# Patient Record
Sex: Female | Born: 1960 | Race: Black or African American | Hispanic: No | Marital: Single | Smoking: Current every day smoker
Health system: Southern US, Community
[De-identification: ages and names within clinical notes are randomized; demographics above are authoritative.]

## PROBLEM LIST (undated history)

## (undated) DIAGNOSIS — I10 Essential (primary) hypertension: Secondary | ICD-10-CM

## (undated) DIAGNOSIS — F329 Major depressive disorder, single episode, unspecified: Secondary | ICD-10-CM

## (undated) DIAGNOSIS — G43909 Migraine, unspecified, not intractable, without status migrainosus: Secondary | ICD-10-CM

## (undated) DIAGNOSIS — J189 Pneumonia, unspecified organism: Secondary | ICD-10-CM

## (undated) DIAGNOSIS — Z89511 Acquired absence of right leg below knee: Secondary | ICD-10-CM

## (undated) DIAGNOSIS — M1990 Unspecified osteoarthritis, unspecified site: Secondary | ICD-10-CM

## (undated) DIAGNOSIS — R519 Headache, unspecified: Secondary | ICD-10-CM

## (undated) DIAGNOSIS — D649 Anemia, unspecified: Secondary | ICD-10-CM

## (undated) DIAGNOSIS — Z89512 Acquired absence of left leg below knee: Secondary | ICD-10-CM

## (undated) DIAGNOSIS — F32A Depression, unspecified: Secondary | ICD-10-CM

## (undated) DIAGNOSIS — M069 Rheumatoid arthritis, unspecified: Secondary | ICD-10-CM

## (undated) DIAGNOSIS — F1410 Cocaine abuse, uncomplicated: Secondary | ICD-10-CM

## (undated) DIAGNOSIS — R51 Headache: Secondary | ICD-10-CM

## (undated) DIAGNOSIS — I776 Arteritis, unspecified: Secondary | ICD-10-CM

## (undated) HISTORY — DX: Cocaine abuse, uncomplicated: F14.10

## (undated) HISTORY — DX: Acquired absence of right leg below knee: Z89.511

## (undated) HISTORY — DX: Acquired absence of left leg below knee: Z89.512

## (undated) HISTORY — DX: Major depressive disorder, single episode, unspecified: F32.9

## (undated) HISTORY — PX: HERNIA REPAIR: SHX51

## (undated) HISTORY — DX: Depression, unspecified: F32.A

## (undated) HISTORY — DX: Essential (primary) hypertension: I10

## (undated) HISTORY — PX: SKIN BIOPSY: SHX1

## (undated) HISTORY — DX: Arteritis, unspecified: I77.6

---

## 2008-12-12 ENCOUNTER — Emergency Department (HOSPITAL_COMMUNITY): Admission: EM | Admit: 2008-12-12 | Discharge: 2008-12-12 | Payer: Self-pay | Admitting: Emergency Medicine

## 2008-12-21 ENCOUNTER — Emergency Department (HOSPITAL_COMMUNITY): Admission: EM | Admit: 2008-12-21 | Discharge: 2008-12-21 | Payer: Self-pay | Admitting: Emergency Medicine

## 2008-12-29 ENCOUNTER — Emergency Department (HOSPITAL_COMMUNITY): Admission: EM | Admit: 2008-12-29 | Discharge: 2008-12-29 | Payer: Self-pay | Admitting: Emergency Medicine

## 2009-04-11 ENCOUNTER — Emergency Department (HOSPITAL_COMMUNITY): Admission: EM | Admit: 2009-04-11 | Discharge: 2009-04-12 | Payer: Self-pay | Admitting: Emergency Medicine

## 2009-05-07 ENCOUNTER — Emergency Department (HOSPITAL_COMMUNITY): Admission: EM | Admit: 2009-05-07 | Discharge: 2009-05-08 | Payer: Self-pay | Admitting: Emergency Medicine

## 2009-05-12 ENCOUNTER — Inpatient Hospital Stay (HOSPITAL_COMMUNITY): Admission: EM | Admit: 2009-05-12 | Discharge: 2009-05-17 | Payer: Self-pay | Admitting: Emergency Medicine

## 2009-05-31 ENCOUNTER — Encounter: Payer: Self-pay | Admitting: Internal Medicine

## 2009-05-31 ENCOUNTER — Inpatient Hospital Stay (HOSPITAL_COMMUNITY): Admission: EM | Admit: 2009-05-31 | Discharge: 2009-06-12 | Payer: Self-pay | Admitting: Emergency Medicine

## 2009-05-31 ENCOUNTER — Ambulatory Visit: Payer: Self-pay | Admitting: Infectious Disease

## 2009-06-01 ENCOUNTER — Ambulatory Visit: Payer: Self-pay | Admitting: Vascular Surgery

## 2009-06-01 ENCOUNTER — Encounter: Payer: Self-pay | Admitting: Infectious Disease

## 2009-06-03 ENCOUNTER — Encounter (INDEPENDENT_AMBULATORY_CARE_PROVIDER_SITE_OTHER): Payer: Self-pay | Admitting: Internal Medicine

## 2009-06-05 ENCOUNTER — Encounter (INDEPENDENT_AMBULATORY_CARE_PROVIDER_SITE_OTHER): Payer: Self-pay | Admitting: Otolaryngology

## 2009-07-08 ENCOUNTER — Encounter (HOSPITAL_COMMUNITY): Admission: RE | Admit: 2009-07-08 | Discharge: 2009-10-03 | Payer: Self-pay | Admitting: Infectious Diseases

## 2009-10-05 ENCOUNTER — Emergency Department (HOSPITAL_COMMUNITY): Admission: EM | Admit: 2009-10-05 | Discharge: 2009-10-05 | Payer: Self-pay | Admitting: Emergency Medicine

## 2009-10-07 ENCOUNTER — Encounter (HOSPITAL_COMMUNITY): Admission: RE | Admit: 2009-10-07 | Discharge: 2010-01-05 | Payer: Self-pay | Admitting: Infectious Diseases

## 2009-11-20 ENCOUNTER — Emergency Department (HOSPITAL_COMMUNITY): Admission: EM | Admit: 2009-11-20 | Discharge: 2009-11-20 | Payer: Self-pay | Admitting: Emergency Medicine

## 2009-11-21 ENCOUNTER — Emergency Department (HOSPITAL_COMMUNITY): Admission: EM | Admit: 2009-11-21 | Discharge: 2009-11-21 | Payer: Self-pay | Admitting: Emergency Medicine

## 2009-11-24 ENCOUNTER — Emergency Department (HOSPITAL_COMMUNITY): Admission: EM | Admit: 2009-11-24 | Discharge: 2009-11-24 | Payer: Self-pay | Admitting: Emergency Medicine

## 2009-11-26 ENCOUNTER — Inpatient Hospital Stay (HOSPITAL_COMMUNITY): Admission: EM | Admit: 2009-11-26 | Discharge: 2009-12-02 | Payer: Self-pay | Admitting: Emergency Medicine

## 2009-11-26 ENCOUNTER — Ambulatory Visit: Payer: Self-pay | Admitting: Internal Medicine

## 2009-11-26 ENCOUNTER — Ambulatory Visit: Payer: Self-pay | Admitting: Cardiology

## 2009-11-27 ENCOUNTER — Encounter: Payer: Self-pay | Admitting: Internal Medicine

## 2009-12-02 ENCOUNTER — Encounter (INDEPENDENT_AMBULATORY_CARE_PROVIDER_SITE_OTHER): Payer: Self-pay | Admitting: Internal Medicine

## 2009-12-03 ENCOUNTER — Emergency Department (HOSPITAL_COMMUNITY): Admission: EM | Admit: 2009-12-03 | Discharge: 2009-12-04 | Payer: Self-pay | Admitting: Emergency Medicine

## 2010-01-13 ENCOUNTER — Emergency Department (HOSPITAL_COMMUNITY): Admission: EM | Admit: 2010-01-13 | Discharge: 2010-01-14 | Payer: Self-pay | Admitting: Emergency Medicine

## 2010-01-30 ENCOUNTER — Ambulatory Visit: Payer: Self-pay | Admitting: Internal Medicine

## 2010-01-30 ENCOUNTER — Ambulatory Visit: Payer: Self-pay | Admitting: Infectious Diseases

## 2010-01-30 ENCOUNTER — Ambulatory Visit: Payer: Self-pay | Admitting: Pulmonary Disease

## 2010-01-30 ENCOUNTER — Inpatient Hospital Stay (HOSPITAL_COMMUNITY): Admission: EM | Admit: 2010-01-30 | Discharge: 2010-02-26 | Payer: Self-pay | Admitting: Emergency Medicine

## 2010-01-30 ENCOUNTER — Encounter (INDEPENDENT_AMBULATORY_CARE_PROVIDER_SITE_OTHER): Payer: Self-pay | Admitting: Internal Medicine

## 2010-01-30 ENCOUNTER — Encounter (INDEPENDENT_AMBULATORY_CARE_PROVIDER_SITE_OTHER): Payer: Self-pay | Admitting: Emergency Medicine

## 2010-01-30 ENCOUNTER — Ambulatory Visit: Payer: Self-pay | Admitting: Surgery

## 2010-02-21 ENCOUNTER — Ambulatory Visit: Payer: Self-pay | Admitting: Psychiatry

## 2010-02-24 ENCOUNTER — Ambulatory Visit: Payer: Self-pay | Admitting: Psychiatry

## 2010-02-26 ENCOUNTER — Encounter: Payer: Self-pay | Admitting: Internal Medicine

## 2010-02-26 DIAGNOSIS — I10 Essential (primary) hypertension: Secondary | ICD-10-CM

## 2010-02-26 DIAGNOSIS — T405X1A Poisoning by cocaine, accidental (unintentional), initial encounter: Secondary | ICD-10-CM | POA: Insufficient documentation

## 2010-03-06 ENCOUNTER — Ambulatory Visit: Payer: Self-pay | Admitting: Internal Medicine

## 2010-03-06 DIAGNOSIS — F39 Unspecified mood [affective] disorder: Secondary | ICD-10-CM | POA: Insufficient documentation

## 2010-03-10 ENCOUNTER — Telehealth: Payer: Self-pay | Admitting: *Deleted

## 2010-03-10 ENCOUNTER — Encounter: Payer: Self-pay | Admitting: Internal Medicine

## 2010-03-11 ENCOUNTER — Telehealth: Payer: Self-pay | Admitting: Licensed Clinical Social Worker

## 2010-03-12 ENCOUNTER — Encounter: Payer: Self-pay | Admitting: Internal Medicine

## 2010-03-14 ENCOUNTER — Encounter: Payer: Self-pay | Admitting: Licensed Clinical Social Worker

## 2010-03-22 ENCOUNTER — Emergency Department (HOSPITAL_COMMUNITY): Admission: EM | Admit: 2010-03-22 | Discharge: 2010-03-22 | Payer: Self-pay | Admitting: Emergency Medicine

## 2010-03-25 ENCOUNTER — Emergency Department (HOSPITAL_COMMUNITY): Admission: EM | Admit: 2010-03-25 | Discharge: 2010-03-26 | Payer: Self-pay | Admitting: Emergency Medicine

## 2010-04-05 ENCOUNTER — Emergency Department (HOSPITAL_COMMUNITY): Admission: EM | Admit: 2010-04-05 | Discharge: 2010-04-06 | Payer: Self-pay | Admitting: Emergency Medicine

## 2010-04-07 ENCOUNTER — Emergency Department (HOSPITAL_COMMUNITY): Admission: EM | Admit: 2010-04-07 | Discharge: 2010-04-08 | Payer: Self-pay | Admitting: Emergency Medicine

## 2010-04-10 ENCOUNTER — Encounter: Admission: RE | Admit: 2010-04-10 | Discharge: 2010-04-28 | Payer: Self-pay | Admitting: Family Medicine

## 2010-04-11 ENCOUNTER — Ambulatory Visit: Payer: Self-pay | Admitting: Internal Medicine

## 2010-04-11 ENCOUNTER — Inpatient Hospital Stay (HOSPITAL_COMMUNITY)
Admission: EM | Admit: 2010-04-11 | Discharge: 2010-04-17 | Payer: Self-pay | Source: Home / Self Care | Admitting: Internal Medicine

## 2010-04-11 ENCOUNTER — Encounter: Payer: Self-pay | Admitting: Licensed Clinical Social Worker

## 2010-04-11 ENCOUNTER — Encounter: Payer: Self-pay | Admitting: Ophthalmology

## 2010-04-11 DIAGNOSIS — F3289 Other specified depressive episodes: Secondary | ICD-10-CM | POA: Insufficient documentation

## 2010-04-11 DIAGNOSIS — F329 Major depressive disorder, single episode, unspecified: Secondary | ICD-10-CM | POA: Insufficient documentation

## 2010-04-11 DIAGNOSIS — G3184 Mild cognitive impairment, so stated: Secondary | ICD-10-CM | POA: Insufficient documentation

## 2010-04-17 ENCOUNTER — Encounter: Payer: Self-pay | Admitting: Internal Medicine

## 2010-04-17 DIAGNOSIS — I776 Arteritis, unspecified: Secondary | ICD-10-CM

## 2010-04-17 HISTORY — DX: Arteritis, unspecified: I77.6

## 2010-04-20 ENCOUNTER — Encounter: Payer: Self-pay | Admitting: Internal Medicine

## 2010-04-20 ENCOUNTER — Inpatient Hospital Stay (HOSPITAL_COMMUNITY)
Admission: EM | Admit: 2010-04-20 | Discharge: 2010-04-26 | Payer: Self-pay | Source: Home / Self Care | Admitting: Emergency Medicine

## 2010-04-26 ENCOUNTER — Encounter: Payer: Self-pay | Admitting: Internal Medicine

## 2010-04-29 ENCOUNTER — Telehealth: Payer: Self-pay | Admitting: Licensed Clinical Social Worker

## 2010-05-08 ENCOUNTER — Ambulatory Visit: Payer: Self-pay | Admitting: Internal Medicine

## 2010-05-09 ENCOUNTER — Telehealth: Payer: Self-pay | Admitting: Licensed Clinical Social Worker

## 2010-05-20 ENCOUNTER — Telehealth: Payer: Self-pay | Admitting: Licensed Clinical Social Worker

## 2010-05-22 ENCOUNTER — Ambulatory Visit: Payer: Self-pay | Admitting: Internal Medicine

## 2010-05-25 ENCOUNTER — Ambulatory Visit: Payer: Self-pay | Admitting: Internal Medicine

## 2010-05-25 ENCOUNTER — Observation Stay (HOSPITAL_COMMUNITY): Admission: EM | Admit: 2010-05-25 | Discharge: 2010-06-02 | Payer: Self-pay | Admitting: Emergency Medicine

## 2010-05-25 ENCOUNTER — Encounter: Payer: Self-pay | Admitting: Internal Medicine

## 2010-05-28 ENCOUNTER — Telehealth: Payer: Self-pay | Admitting: Licensed Clinical Social Worker

## 2010-06-02 ENCOUNTER — Emergency Department (HOSPITAL_COMMUNITY): Admission: EM | Admit: 2010-06-02 | Discharge: 2010-06-03 | Payer: Self-pay | Admitting: Emergency Medicine

## 2010-06-02 ENCOUNTER — Encounter: Payer: Self-pay | Admitting: Internal Medicine

## 2010-06-03 ENCOUNTER — Encounter: Payer: Self-pay | Admitting: Internal Medicine

## 2010-06-03 ENCOUNTER — Encounter: Payer: Self-pay | Admitting: Licensed Clinical Social Worker

## 2010-06-08 ENCOUNTER — Encounter: Payer: Self-pay | Admitting: Ophthalmology

## 2010-06-08 ENCOUNTER — Ambulatory Visit: Payer: Self-pay | Admitting: Infectious Diseases

## 2010-06-08 ENCOUNTER — Inpatient Hospital Stay (HOSPITAL_COMMUNITY): Admission: EM | Admit: 2010-06-08 | Discharge: 2010-06-13 | Payer: Self-pay | Admitting: Emergency Medicine

## 2010-06-12 ENCOUNTER — Telehealth: Payer: Self-pay | Admitting: Licensed Clinical Social Worker

## 2010-06-12 ENCOUNTER — Encounter: Payer: Self-pay | Admitting: Ophthalmology

## 2010-06-18 ENCOUNTER — Telehealth: Payer: Self-pay

## 2010-06-24 ENCOUNTER — Observation Stay (HOSPITAL_COMMUNITY)
Admission: EM | Admit: 2010-06-24 | Discharge: 2010-06-30 | Payer: Self-pay | Source: Home / Self Care | Admitting: Emergency Medicine

## 2010-06-25 ENCOUNTER — Ambulatory Visit: Payer: Self-pay | Admitting: Infectious Diseases

## 2010-06-25 ENCOUNTER — Encounter: Payer: Self-pay | Admitting: Internal Medicine

## 2010-06-26 ENCOUNTER — Encounter: Payer: Self-pay | Admitting: Infectious Diseases

## 2010-06-26 ENCOUNTER — Encounter: Payer: Self-pay | Admitting: Internal Medicine

## 2010-06-27 ENCOUNTER — Encounter: Payer: Self-pay | Admitting: Ophthalmology

## 2010-06-30 ENCOUNTER — Encounter: Payer: Self-pay | Admitting: Internal Medicine

## 2010-07-06 ENCOUNTER — Encounter: Payer: Self-pay | Admitting: Ophthalmology

## 2010-07-06 ENCOUNTER — Ambulatory Visit: Payer: Self-pay | Admitting: Internal Medicine

## 2010-07-06 ENCOUNTER — Inpatient Hospital Stay (HOSPITAL_COMMUNITY): Admission: EM | Admit: 2010-07-06 | Discharge: 2010-07-25 | Payer: Self-pay | Admitting: Emergency Medicine

## 2010-07-07 ENCOUNTER — Encounter: Payer: Self-pay | Admitting: Infectious Diseases

## 2010-07-08 ENCOUNTER — Encounter: Payer: Self-pay | Admitting: Internal Medicine

## 2010-07-24 ENCOUNTER — Encounter: Payer: Self-pay | Admitting: Internal Medicine

## 2010-07-26 ENCOUNTER — Emergency Department (HOSPITAL_COMMUNITY): Admission: EM | Admit: 2010-07-26 | Discharge: 2010-07-26 | Payer: Self-pay | Admitting: Emergency Medicine

## 2010-07-27 ENCOUNTER — Emergency Department (HOSPITAL_COMMUNITY): Admission: EM | Admit: 2010-07-27 | Discharge: 2010-07-28 | Payer: Self-pay | Admitting: Emergency Medicine

## 2010-07-29 ENCOUNTER — Emergency Department (HOSPITAL_COMMUNITY): Admission: EM | Admit: 2010-07-29 | Discharge: 2010-07-29 | Payer: Self-pay | Admitting: Emergency Medicine

## 2010-07-30 ENCOUNTER — Ambulatory Visit: Payer: Self-pay | Admitting: Internal Medicine

## 2010-07-31 ENCOUNTER — Emergency Department (HOSPITAL_COMMUNITY): Admission: EM | Admit: 2010-07-31 | Discharge: 2010-07-31 | Payer: Self-pay | Admitting: Emergency Medicine

## 2010-07-31 ENCOUNTER — Telehealth: Payer: Self-pay | Admitting: Licensed Clinical Social Worker

## 2010-08-01 ENCOUNTER — Emergency Department (HOSPITAL_COMMUNITY): Admission: EM | Admit: 2010-08-01 | Discharge: 2010-08-02 | Payer: Self-pay | Admitting: Emergency Medicine

## 2010-08-05 ENCOUNTER — Ambulatory Visit: Payer: Self-pay | Admitting: Infectious Diseases

## 2010-08-05 ENCOUNTER — Inpatient Hospital Stay (HOSPITAL_COMMUNITY): Admission: EM | Admit: 2010-08-05 | Discharge: 2010-08-14 | Payer: Self-pay | Admitting: Emergency Medicine

## 2010-08-05 ENCOUNTER — Encounter: Payer: Self-pay | Admitting: Internal Medicine

## 2010-08-13 ENCOUNTER — Encounter: Payer: Self-pay | Admitting: Internal Medicine

## 2010-08-13 DIAGNOSIS — B029 Zoster without complications: Secondary | ICD-10-CM | POA: Insufficient documentation

## 2010-08-21 ENCOUNTER — Encounter: Payer: Self-pay | Admitting: Internal Medicine

## 2010-08-23 ENCOUNTER — Ambulatory Visit: Payer: Self-pay | Admitting: Psychiatry

## 2010-08-24 ENCOUNTER — Emergency Department (HOSPITAL_COMMUNITY): Admission: EM | Admit: 2010-08-24 | Discharge: 2010-08-24 | Payer: Self-pay | Admitting: Emergency Medicine

## 2010-08-29 ENCOUNTER — Emergency Department (HOSPITAL_COMMUNITY): Admission: EM | Admit: 2010-08-29 | Discharge: 2010-08-30 | Payer: Self-pay | Admitting: Emergency Medicine

## 2010-08-30 ENCOUNTER — Telehealth: Payer: Self-pay | Admitting: Internal Medicine

## 2010-09-03 ENCOUNTER — Ambulatory Visit: Payer: Self-pay | Admitting: Internal Medicine

## 2010-09-03 DIAGNOSIS — R079 Chest pain, unspecified: Secondary | ICD-10-CM

## 2010-09-09 ENCOUNTER — Emergency Department (HOSPITAL_COMMUNITY)
Admission: EM | Admit: 2010-09-09 | Discharge: 2010-09-09 | Payer: Self-pay | Source: Home / Self Care | Admitting: Emergency Medicine

## 2010-09-10 ENCOUNTER — Ambulatory Visit (HOSPITAL_COMMUNITY)
Admission: RE | Admit: 2010-09-10 | Discharge: 2010-09-10 | Payer: Self-pay | Source: Home / Self Care | Attending: Internal Medicine | Admitting: Internal Medicine

## 2010-09-11 ENCOUNTER — Emergency Department (HOSPITAL_COMMUNITY): Admission: EM | Admit: 2010-09-11 | Discharge: 2010-06-20 | Payer: Self-pay | Admitting: Emergency Medicine

## 2010-09-13 ENCOUNTER — Emergency Department (HOSPITAL_COMMUNITY)
Admission: EM | Admit: 2010-09-13 | Discharge: 2010-09-13 | Payer: Self-pay | Source: Home / Self Care | Admitting: Emergency Medicine

## 2010-09-15 ENCOUNTER — Encounter: Payer: Self-pay | Admitting: Internal Medicine

## 2010-09-15 ENCOUNTER — Inpatient Hospital Stay (HOSPITAL_COMMUNITY)
Admission: AD | Admit: 2010-09-15 | Discharge: 2010-09-17 | Payer: Self-pay | Source: Home / Self Care | Attending: Internal Medicine | Admitting: Internal Medicine

## 2010-09-15 ENCOUNTER — Ambulatory Visit: Payer: Self-pay | Admitting: Internal Medicine

## 2010-09-17 ENCOUNTER — Encounter: Payer: Self-pay | Admitting: Internal Medicine

## 2010-09-17 DIAGNOSIS — M129 Arthropathy, unspecified: Secondary | ICD-10-CM | POA: Insufficient documentation

## 2010-09-18 ENCOUNTER — Other Ambulatory Visit
Admission: RE | Admit: 2010-09-18 | Discharge: 2010-09-18 | Payer: Self-pay | Source: Home / Self Care | Admitting: Internal Medicine

## 2010-09-18 ENCOUNTER — Ambulatory Visit: Payer: Self-pay | Admitting: Internal Medicine

## 2010-09-18 LAB — HM PAP SMEAR

## 2010-09-24 LAB — CONVERTED CEMR LAB

## 2010-09-25 LAB — CONVERTED CEMR LAB: Gardnerella vaginalis: NEGATIVE

## 2010-10-05 ENCOUNTER — Emergency Department (HOSPITAL_COMMUNITY)
Admission: EM | Admit: 2010-10-05 | Discharge: 2010-10-05 | Disposition: A | Discharge status: Other Acute Inpatient Hospital | Payer: Self-pay | Source: Home / Self Care | Admitting: Emergency Medicine

## 2010-10-05 ENCOUNTER — Emergency Department (HOSPITAL_COMMUNITY)
Admission: EM | Admit: 2010-10-05 | Discharge: 2010-10-06 | Payer: Self-pay | Source: Home / Self Care | Admitting: Emergency Medicine

## 2010-10-08 ENCOUNTER — Ambulatory Visit: Admission: RE | Admit: 2010-10-08 | Discharge: 2010-10-08 | Payer: Self-pay | Source: Home / Self Care

## 2010-10-10 ENCOUNTER — Encounter: Payer: Self-pay | Admitting: Internal Medicine

## 2010-10-12 ENCOUNTER — Emergency Department (HOSPITAL_COMMUNITY)
Admission: EM | Admit: 2010-10-12 | Discharge: 2010-10-12 | Payer: Self-pay | Source: Home / Self Care | Admitting: Emergency Medicine

## 2010-10-14 ENCOUNTER — Emergency Department (HOSPITAL_COMMUNITY)
Admission: EM | Admit: 2010-10-14 | Discharge: 2010-10-14 | Payer: Self-pay | Source: Home / Self Care | Admitting: Emergency Medicine

## 2010-10-14 ENCOUNTER — Telehealth (INDEPENDENT_AMBULATORY_CARE_PROVIDER_SITE_OTHER): Payer: Self-pay | Admitting: *Deleted

## 2010-10-16 ENCOUNTER — Emergency Department (HOSPITAL_COMMUNITY)
Admission: EM | Admit: 2010-10-16 | Discharge: 2010-10-16 | Payer: Self-pay | Source: Home / Self Care | Admitting: Emergency Medicine

## 2010-10-20 ENCOUNTER — Emergency Department (HOSPITAL_COMMUNITY)
Admission: EM | Admit: 2010-10-20 | Discharge: 2010-10-20 | Payer: Self-pay | Source: Home / Self Care | Admitting: Emergency Medicine

## 2010-10-20 LAB — CBC
HCT: 35 % — ABNORMAL LOW (ref 36.0–46.0)
HCT: 32.7 % — ABNORMAL LOW (ref 36.0–46.0)
Hemoglobin: 11.6 g/dL — ABNORMAL LOW (ref 12.0–15.0)
Hemoglobin: 10.9 g/dL — ABNORMAL LOW (ref 12.0–15.0)
MCH: 28.6 pg (ref 26.0–34.0)
MCH: 28 pg (ref 26.0–34.0)
MCHC: 33.1 g/dL (ref 30.0–36.0)
MCHC: 33.3 g/dL (ref 30.0–36.0)
MCV: 86.2 fL (ref 78.0–100.0)
MCV: 84.1 fL (ref 78.0–100.0)
Platelets: 323 10*3/uL (ref 150–400)
Platelets: 390 10*3/uL (ref 150–400)
RBC: 4.06 MIL/uL (ref 3.87–5.11)
RBC: 3.89 MIL/uL (ref 3.87–5.11)
RDW: 16.3 % — ABNORMAL HIGH (ref 11.5–15.5)
RDW: 16.1 % — ABNORMAL HIGH (ref 11.5–15.5)
WBC: 4.5 10*3/uL (ref 4.0–10.5)
WBC: 7.5 10*3/uL (ref 4.0–10.5)

## 2010-10-20 LAB — COMPREHENSIVE METABOLIC PANEL
ALT: 13 U/L (ref 0–35)
AST: 17 U/L (ref 0–37)
Albumin: 3.3 g/dL — ABNORMAL LOW (ref 3.5–5.2)
Alkaline Phosphatase: 72 U/L (ref 39–117)
BUN: 12 mg/dL (ref 6–23)
CO2: 28 mEq/L (ref 19–32)
Calcium: 9.3 mg/dL (ref 8.4–10.5)
Chloride: 104 mEq/L (ref 96–112)
Creatinine, Ser: 0.93 mg/dL (ref 0.4–1.2)
GFR calc Af Amer: >60 mL/min (ref >60)
GFR calc non Af Amer: >60 mL/min (ref >60)
Glucose, Bld: 104 mg/dL — ABNORMAL HIGH (ref 70–99)
Potassium: 3.9 mEq/L (ref 3.5–5.1)
Sodium: 140 mEq/L (ref 135–145)
Total Bilirubin: 0.4 mg/dL (ref 0.3–1.2)
Total Protein: 7 g/dL (ref 6.0–8.3)

## 2010-10-20 LAB — POCT CARDIAC MARKERS
CKMB, poc: <1 ng/mL — ABNORMAL LOW (ref 1.0–8.0)
Myoglobin, poc: 71 ng/mL (ref 12–200)
Troponin i, poc: <0.05 ng/mL (ref 0.00–0.09)

## 2010-10-20 LAB — POCT I-STAT, CHEM 8
BUN: 5 mg/dL — ABNORMAL LOW (ref 6–23)
Calcium, Ion: 1.14 mmol/L (ref 1.12–1.32)
Chloride: 104 mEq/L (ref 96–112)
Creatinine, Ser: 0.8 mg/dL (ref 0.4–1.2)
Glucose, Bld: 94 mg/dL (ref 70–99)
HCT: 33 % — ABNORMAL LOW (ref 36.0–46.0)
Hemoglobin: 11.2 g/dL — ABNORMAL LOW (ref 12.0–15.0)
Potassium: 3.9 mEq/L (ref 3.5–5.1)
Sodium: 139 mEq/L (ref 135–145)
TCO2: 26 mmol/L (ref 0–100)

## 2010-10-20 LAB — DIFFERENTIAL
Basophils Absolute: 0 10*3/uL (ref 0.0–0.1)
Basophils Absolute: 0 10*3/uL (ref 0.0–0.1)
Basophils Relative: 0 % (ref 0–1)
Basophils Relative: 0 % (ref 0–1)
Eosinophils Absolute: 0.1 10*3/uL (ref 0.0–0.7)
Eosinophils Absolute: 0 10*3/uL (ref 0.0–0.7)
Eosinophils Relative: 2 % (ref 0–5)
Eosinophils Relative: 0 % (ref 0–5)
Lymphocytes Relative: 24 % (ref 12–46)
Lymphocytes Relative: 10 % — ABNORMAL LOW (ref 12–46)
Lymphs Abs: 1.1 10*3/uL (ref 0.7–4.0)
Lymphs Abs: 0.7 10*3/uL (ref 0.7–4.0)
Monocytes Absolute: 0.2 10*3/uL (ref 0.1–1.0)
Monocytes Absolute: 0.3 10*3/uL (ref 0.1–1.0)
Monocytes Relative: 5 % (ref 3–12)
Monocytes Relative: 4 % (ref 3–12)
Neutro Abs: 3.1 10*3/uL (ref 1.7–7.7)
Neutro Abs: 6.5 10*3/uL (ref 1.7–7.7)
Neutrophils Relative %: 69 % (ref 43–77)
Neutrophils Relative %: 86 % — ABNORMAL HIGH (ref 43–77)

## 2010-10-20 LAB — BASIC METABOLIC PANEL
BUN: 10 mg/dL (ref 6–23)
CO2: 26 mEq/L (ref 19–32)
Calcium: 9.1 mg/dL (ref 8.4–10.5)
Chloride: 105 mEq/L (ref 96–112)
Creatinine, Ser: 0.67 mg/dL (ref 0.4–1.2)
GFR calc Af Amer: >60 mL/min (ref >60)
GFR calc non Af Amer: >60 mL/min (ref >60)
Glucose, Bld: 106 mg/dL — ABNORMAL HIGH (ref 70–99)
Potassium: 3.9 mEq/L (ref 3.5–5.1)
Sodium: 139 mEq/L (ref 135–145)

## 2010-10-20 LAB — LIPASE, BLOOD: Lipase: 21 U/L (ref 11–59)

## 2010-10-22 ENCOUNTER — Emergency Department (HOSPITAL_COMMUNITY)
Admission: EM | Admit: 2010-10-22 | Discharge: 2010-10-23 | Payer: Self-pay | Source: Home / Self Care | Admitting: Emergency Medicine

## 2010-10-27 LAB — DIFFERENTIAL
Lymphs Abs: 2 10*3/uL (ref 0.7–4.0)
Monocytes Relative: 8 % (ref 3–12)
Neutro Abs: 3 10*3/uL (ref 1.7–7.7)
Neutrophils Relative %: 55 % (ref 43–77)

## 2010-10-27 LAB — CBC
Hemoglobin: 12 g/dL (ref 12.0–15.0)
MCH: 28 pg (ref 26.0–34.0)
MCV: 83.2 fL (ref 78.0–100.0)
RBC: 4.28 MIL/uL (ref 3.87–5.11)

## 2010-10-28 ENCOUNTER — Emergency Department (HOSPITAL_COMMUNITY)
Admission: EM | Admit: 2010-10-28 | Discharge: 2010-10-28 | Payer: Self-pay | Source: Home / Self Care | Admitting: Emergency Medicine

## 2010-10-29 ENCOUNTER — Inpatient Hospital Stay (HOSPITAL_COMMUNITY)
Admission: EM | Admit: 2010-10-29 | Discharge: 2010-11-04 | Disposition: A | Discharge status: Skilled Nursing Facility | Payer: Self-pay | Attending: Internal Medicine | Admitting: Internal Medicine

## 2010-10-29 ENCOUNTER — Emergency Department (HOSPITAL_COMMUNITY)
Admission: EM | Admit: 2010-10-29 | Discharge: 2010-10-29 | Disposition: A | Discharge status: Other Acute Inpatient Hospital | Payer: Self-pay | Source: Home / Self Care | Admitting: Emergency Medicine

## 2010-10-29 ENCOUNTER — Ambulatory Visit: Admit: 2010-10-29 | Payer: Self-pay | Admitting: Obstetrics and Gynecology

## 2010-10-29 ENCOUNTER — Encounter: Payer: Self-pay | Admitting: Internal Medicine

## 2010-10-29 DIAGNOSIS — M069 Rheumatoid arthritis, unspecified: Secondary | ICD-10-CM

## 2010-10-29 LAB — CBC
HCT: 41.7 % (ref 36.0–46.0)
HCT: 32.4 % — ABNORMAL LOW (ref 36.0–46.0)
Hemoglobin: 13.9 g/dL (ref 12.0–15.0)
MCH: 27.6 pg (ref 26.0–34.0)
MCH: 27.7 pg (ref 26.0–34.0)
MCHC: 33.3 g/dL (ref 30.0–36.0)
MCV: 82.7 fL (ref 78.0–100.0)
MCV: 82.2 fL (ref 78.0–100.0)
Platelets: 270 10*3/uL (ref 150–400)
RBC: 3.94 MIL/uL (ref 3.87–5.11)
RDW: 15.6 % — ABNORMAL HIGH (ref 11.5–15.5)

## 2010-10-29 LAB — DIFFERENTIAL
Basophils Absolute: 0 10*3/uL (ref 0.0–0.1)
Lymphocytes Relative: 17 % (ref 12–46)
Lymphs Abs: 1.2 10*3/uL (ref 0.7–4.0)
Monocytes Absolute: 0.2 10*3/uL (ref 0.1–1.0)
Monocytes Relative: 3 % (ref 3–12)
Neutro Abs: 5.4 10*3/uL (ref 1.7–7.7)

## 2010-10-29 LAB — RAPID URINE DRUG SCREEN, HOSP PERFORMED
Amphetamines: NOT DETECTED
Tetrahydrocannabinol: NOT DETECTED

## 2010-10-29 LAB — URINALYSIS, ROUTINE W REFLEX MICROSCOPIC
Bilirubin Urine: NEGATIVE
Specific Gravity, Urine: 1.02 (ref 1.005–1.030)
Urine Glucose, Fasting: NEGATIVE mg/dL
pH: 6 (ref 5.0–8.0)

## 2010-10-29 LAB — GLUCOSE, CAPILLARY: Glucose-Capillary: 128 mg/dL — ABNORMAL HIGH (ref 70–99)

## 2010-10-29 LAB — POCT CARDIAC MARKERS
CKMB, poc: 1 ng/mL (ref 1.0–8.0)
Myoglobin, poc: 58.7 ng/mL (ref 12–200)

## 2010-10-29 LAB — BASIC METABOLIC PANEL
CO2: 22 mEq/L (ref 19–32)
Calcium: 9.3 mg/dL (ref 8.4–10.5)
Glucose, Bld: 125 mg/dL — ABNORMAL HIGH (ref 70–99)
Sodium: 134 mEq/L — ABNORMAL LOW (ref 135–145)

## 2010-10-29 LAB — URIC ACID: Uric Acid, Serum: 3.7 mg/dL (ref 2.4–7.0)

## 2010-10-29 LAB — URINE MICROSCOPIC-ADD ON

## 2010-10-30 DIAGNOSIS — M069 Rheumatoid arthritis, unspecified: Secondary | ICD-10-CM

## 2010-10-30 LAB — URINE MICROSCOPIC-ADD ON

## 2010-10-30 LAB — COMPREHENSIVE METABOLIC PANEL
Albumin: 2.6 g/dL — ABNORMAL LOW (ref 3.5–5.2)
Alkaline Phosphatase: 57 U/L (ref 39–117)
BUN: 8 mg/dL (ref 6–23)
Calcium: 8.4 mg/dL (ref 8.4–10.5)
Creatinine, Ser: 0.65 mg/dL (ref 0.4–1.2)
Glucose, Bld: 132 mg/dL — ABNORMAL HIGH (ref 70–99)
Potassium: 3.7 mEq/L (ref 3.5–5.1)
Total Protein: 5.1 g/dL — ABNORMAL LOW (ref 6.0–8.3)

## 2010-10-30 LAB — URINALYSIS, ROUTINE W REFLEX MICROSCOPIC
Bilirubin Urine: NEGATIVE
Nitrite: NEGATIVE
Specific Gravity, Urine: 1.021 (ref 1.005–1.030)
Urobilinogen, UA: 0.2 mg/dL (ref 0.0–1.0)
pH: 5.5 (ref 5.0–8.0)

## 2010-10-30 LAB — CK TOTAL AND CKMB (NOT AT ARMC): Relative Index: 1 (ref 0.0–2.5)

## 2010-10-31 ENCOUNTER — Encounter: Payer: Self-pay | Admitting: Internal Medicine

## 2010-10-31 LAB — CBC
HCT: 27 % — ABNORMAL LOW (ref 36.0–46.0)
Hemoglobin: 9 g/dL — ABNORMAL LOW (ref 12.0–15.0)
MCH: 27.8 pg (ref 26.0–34.0)
MCV: 83.3 fL (ref 78.0–100.0)
Platelets: 247 10*3/uL (ref 150–400)
RBC: 3.24 MIL/uL — ABNORMAL LOW (ref 3.87–5.11)
WBC: 7.4 10*3/uL (ref 4.0–10.5)

## 2010-10-31 LAB — BASIC METABOLIC PANEL
BUN: 4 mg/dL — ABNORMAL LOW (ref 6–23)
CO2: 26 mEq/L (ref 19–32)
Chloride: 103 mEq/L (ref 96–112)
Creatinine, Ser: 0.56 mg/dL (ref 0.4–1.2)
Potassium: 3.5 mEq/L (ref 3.5–5.1)

## 2010-11-01 LAB — BASIC METABOLIC PANEL
BUN: 8 mg/dL (ref 6–23)
Chloride: 104 mEq/L (ref 96–112)
Creatinine, Ser: 0.62 mg/dL (ref 0.4–1.2)
Glucose, Bld: 103 mg/dL — ABNORMAL HIGH (ref 70–99)
Potassium: 3.5 mEq/L (ref 3.5–5.1)

## 2010-11-01 LAB — CBC
HCT: 28.8 % — ABNORMAL LOW (ref 36.0–46.0)
MCH: 27.9 pg (ref 26.0–34.0)
MCV: 82.1 fL (ref 78.0–100.0)
RBC: 3.51 MIL/uL — ABNORMAL LOW (ref 3.87–5.11)
WBC: 8.7 10*3/uL (ref 4.0–10.5)

## 2010-11-02 LAB — CBC
HCT: 28.1 % — ABNORMAL LOW (ref 36.0–46.0)
MCV: 83.4 fL (ref 78.0–100.0)

## 2010-11-02 LAB — BASIC METABOLIC PANEL
CO2: 28 mEq/L (ref 19–32)
Chloride: 102 mEq/L (ref 96–112)
GFR calc Af Amer: >60 mL/min (ref >60)
Potassium: 3.2 mEq/L — ABNORMAL LOW (ref 3.5–5.1)
Sodium: 142 mEq/L (ref 135–145)

## 2010-11-03 LAB — RAPID URINE DRUG SCREEN, HOSP PERFORMED
Amphetamines: NOT DETECTED
Barbiturates: NOT DETECTED
Opiates: POSITIVE — AB
Tetrahydrocannabinol: NOT DETECTED

## 2010-11-03 NOTE — Consult Note (Signed)
[name], [name]                  ACCOUNT NO.:  [id number]  MEDICAL RECORD NO.:  [id number]           PATIENT TYPE:  LOCATION:                                 FACILITY:  PHYSICIAN:  Nelda Severe. Kellie Simmering, M.D.  DATE OF BIRTH:  17-Aug-1961  DATE OF CONSULTATION:  10/30/2010 DATE OF DISCHARGE:                                CONSULTATION   CHIEF COMPLAINT:  Swelling in right upper and lower extremity with pain.  HISTORY OF PRESENT ILLNESS:  This is a 50 year old female patient with a history of polysubstance abuse including cocaine has a history of levamisole-induced vasculitis.  She now presents with a 1- to 2-day history of swelling in the right upper and right lower extremity.  She states that the pain started spontaneously with no trauma to the upper or lower extremities.  She denies IV drug use and states that she has not been taking cocaine.  She states that the knee, lower leg on the right, right forearm, and the right hand have been painful and swollen. She denies any chills and fever.  She denies claudication symptoms specifically and has no history of deep venous thrombosis or arterial insufficiency.  PAST MEDICAL HISTORY: 1. 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 2. Hypertension. 3. Polysubstance abuse. 4. Depression. 5. Negative for coronary artery disease or stroke.  SOCIAL HISTORY:  She is unemployed.  Does have a history of polysubstance abuse.  Denies using cocaine for several weeks.  Admits to less than a pack of cigarettes per day.  Denies alcohol.  FAMILY HISTORY:  Positive for colon cancer, breast cancer, but negative for coronary artery disease, diabetes, and stroke.  REVIEW OF SYSTEMS:  Denies chest pain, dyspnea on exertion.  No chronic bronchitis, asthma, wheezing, has multiple joint discomforts as noted. All other systems are  negative in a complete review of systems.  Please see history and physical exam.  PHYSICAL EXAMINATION:  VITAL SIGNS:  Blood pressure is 161/100, heart rate is 100, respirations 14, temperature 97.8. GENERAL:  She is a tearful chronically ill-appearing thin female who is complaining of pain in her right arm and leg. HEENT:  Conjunctive mildly injected.  There is some pustular inflamed lesions on her lower lids.  EOMs are intact.  She is edentulous. CHEST:  Clear to auscultation.  No rhonchi or wheezing. CARDIOVASCULAR:  Sinus tachycardia with regular rhythm.  No murmur. ABDOMEN:  Soft, nontender with no masses. NEUROLOGIC:  Normal. SKIN:  No specific rashes in the lower or upper extremities. EXTREMITIES:  Lower extremity exam reveals 3+ femoral, popliteal, posterior tibial pulses bilaterally.  Upper extremity exam reveals 3+ brachial and radial pulses palpable bilaterally.  She has sensation and motion.  She does have edema from the right antecubital area to the tips of the fingers with some decreased function of the right hand as far as mobility.  The right leg is swollen in the thigh and calf with a very tender-appearing right knee to any motion.  Right  foot is tender to touch.  It is warm and well perfused.  I have reviewed the chart and medical record as far as laboratory values.  I have also ordered right upper and lower extremity venous duplex exam to rule out DVT which is very doubtful.  IMPRESSION: 1. No evidence of arterial insufficiency, doubt venous thrombosis. 2. Suspect that this is a vasculitis type problem related to her     cocaine use with this history of levamisole-induced vasculitis. 3. I would get the orthopedic or hand surgical consult regarding     possible compartment syndrome in the right upper extremity. 4. Possible steroids. 5. There is no need for any type of arterial evaluation, and if venous     duplex exam is normal no specific treat of her arterial or  venous     system is indicated at this time from vascular surgery standpoint.     Nelda Severe Kellie Simmering, M.D.     JDL/MEDQ  D:  10/30/2010  T:  10/31/2010  Job:  [id number]  Electronically Signed by Tinnie Gens M.D. on 11/03/2010 10:34:19 AM

## 2010-11-04 LAB — CULTURE, BLOOD (ROUTINE X 2)
Culture  Setup Time: 201201252259
Culture: NO GROWTH
Culture: NO GROWTH

## 2010-11-04 LAB — BASIC METABOLIC PANEL
BUN: 11 mg/dL (ref 6–23)
Chloride: 101 mEq/L (ref 96–112)
GFR calc non Af Amer: >60 mL/min (ref >60)
Glucose, Bld: 84 mg/dL (ref 70–99)
Potassium: 3.8 mEq/L (ref 3.5–5.1)
Sodium: 139 mEq/L (ref 135–145)

## 2010-11-04 NOTE — Assessment & Plan Note (Signed)
Summary: NEW HFU-PER DR TOBBIA/CFB   Vital Signs:  Patient profile:   50 year old female Height:      61.5 inches (156.21 cm) Temp:     97.9 degrees F (36.61 degrees C) oral Pulse rate:   73 / minute BP sitting:   115 / 68  (right arm) Cuff size:   small  Vitals Entered By: Lucky Rathke NT II (March 06, 2010 3:25 PM) CC: HOSPITAL FOLLOW UP APPT / ALLERGIES-TYLENOL / PATIENT IS FROM [facility]/ UNABLE TO STAND FOR WEIGHT Is Patient Diabetic? No Pain Assessment Patient in pain? yes     Location: FEET Intensity:           8 Type:   BURNING Onset of pain  X 2 YEARS  Does patient need assistance? Functional Status Self care Ambulation Impaired:Risk for fall Comments UNABLE TO STAND FOR WEIGHT / HOSPITAL FOLLOW UP APPT / ALLERGIES-TYLENOL.   CC:  HOSPITAL FOLLOW UP APPT / ALLERGIES-TYLENOL / PATIENT IS FROM [facility]/ UNABLE TO STAND FOR WEIGHT.  History of Present Illness: Mrs [name] is a 50 yo woman with PMH as outlined below.  She is here for HFU.  She had a lengthy stay and was just discharged about 2 weeks ago.  This was secondary to levimasole toxicity from cocaine use, originally thought to be relapsing polychondritis.  She was seen by Dr. Lenna Gilford, started on prednisone which was tapered and now completed.  She is currently at [facility] assisted living.  Of note, she was taking zoloft 24m daily in the hospital and [facility].  However, she has not had any zoloft since d/c from the hospital and mental health f/u coming up in near future.  Feels like her mood is back down compared to that in hospital, easily tearful.  Has not used any more crack/cocaine since before admission.  She is doing her own dressing changes and wound care at the assisted as instructed to do so in the hospital.  Also has [facility] that does changes as well 3/week.  Reports wounds are looking better.   Preventive Screening-Counseling &  Management  Alcohol-Tobacco     Smoking Status: current     Packs/Day: 3-4 CIGS PER DAY     Year Started: 1980'S  Caffeine-Diet-Exercise     Does Patient Exercise: yes     Type of exercise: PT     Times/week: 30  Current Medications (verified): 1)  Norco 10-325 Mg Tabs (Hydrocodone-Acetaminophen) .... Take 1-2 Tablets Every 6 Hours As Needed For Pain 2)  Ms Contin 30 Mg Xr12h-Tab (Morphine Sulfate) ..Marland Kitchen. 1 Tablet Every 8 Hours For Pain For 1 Week and Then Taper Down To Twice Daily For One Week, Then Once To Twice Daily For One More Week Then Discontinue. 3)  Labetalol Hcl 100 Mg Tabs (Labetalol Hcl) .... 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 NLouanne SkyeHTN Polysubstance abuse  Depression  Social History: Unemployed:  cleaning in past Living at [place] prior crack/cocaine use tobacco:  1/2 ppd alcohol:  none Smoking Status:  current Packs/Day:  3-4 CIGS PER DAY Does Patient Exercise:  yes  Review of Systems      See HPI  Physical Exam  General:  alert, cooperative to examination, and underweight appearing and fraigl.   Eyes:  anicteric, PERRL Neck:  supple.   Lungs:  normal respiratory effort, no accessory muscle use, normal breath sounds, no crackles, and no wheezes.   Heart:  normal rate, regular rhythm, no murmur, no gallop, and no rub.   Abdomen:  normal bowel sounds.   Extremities:  lower extremities bandaged Neurologic:  alert & oriented X3.     Psych:  memory intact for recent and remote and depressed affect.     Impression & Recommendations:  Problem # 1:  ESSENTIAL HYPERTENSION (ICD-401.9) at goal  Her updated medication list for this problem includes:    Labetalol Hcl 100 Mg Tabs (Labetalol hcl) .Marland Kitchen... Take one tablet by mouth twice daily  BP today: 115/68  Problem # 2:  VASCULITIS (ICD-447.6) 2/2 levimasole toxicity from crack/cocaine improving per report. will make sure pt has f/u with Dr. 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 Charissa Bash was on sertraline 66m daily in hospital, d/c'd to ALF without currently deteriorated, easily tearful and depressed mood will restart today will [facility] as per Dr. BDeloris Pingf/u note prior to d/c.   Complete Medication List: 1)  Norco 10-325 Mg Tabs (Hydrocodone-acetaminophen) .... Take 1-2 tablets every 6 hours as needed for pain 2)  Ms Contin 30 Mg Xr12h-tab (Morphine sulfate) ..Marland Kitchen. 1 tablet every 8 hours for pain for 1 week and then taper down to twice daily for one week, then once to twice daily for one more week then discontinue. 3)  Labetalol Hcl 100 Mg Tabs (Labetalol hcl) .... Take one tablet by mouth twice daily 4)  Zoloft 50 Mg Tabs (Sertraline hcl) .... Take 1 tablet by mouth once a day  Patient Instructions: 1)  Please schedule a follow-up appointment in 3 months. 2)  Will restart zoloft. 3)  Will make sure you have follow up with mental health and Dr. NLouanne Skye 4)  I think you have done a great job and are on the right track, we just need to continue to provide support. Prescriptions: ZOLOFT 50 MG TABS (SERTRALINE HCL) Take 1 tablet by mouth once a day  #30 x 1   Entered and Authorized by:   MAlphia MohMD   Signed by:   MAlphia MohMD on 03/06/2010   Method used:   Print then Give to Patient   RxID:   [id number]   Prevention & Chronic  Care Immunizations   Influenza vaccine: Not documented    Tetanus booster: Not documented    Pneumococcal vaccine: Not documented  Other Screening   Pap smear: Not documented    Mammogram: Not documented   Smoking status: current  (03/06/2010)  Lipids   Total Cholesterol: Not documented   LDL: Not documented   LDL Direct: Not documented   HDL: Not documented   Triglycerides: Not documented  Hypertension   Last Blood Pressure: 115 / 68  (03/06/2010)   Serum creatinine: Not documented   Serum potassium Not documented  Self-Management Support :   Personal Goals (by  the next clinic visit) :      Personal blood pressure goal: 140/90  (03/06/2010)   Patient will work on the following items until the next clinic visit to reach self-care goals:     Medications and monitoring: take my medicines every day  (03/06/2010)     Eating: drink diet soda or water instead of juice or soda, eat more vegetables, use fresh or frozen vegetables, eat baked foods instead of fried foods, eat fruit for snacks and desserts, limit or avoid alcohol  (03/06/2010)    Hypertension self-management support: Resources for patients handout  (03/06/2010)    Self-management comments: PATIENT IS IN [facility]/ PT COMES X2 DAYS A WEEK      Resource handout printed.

## 2010-11-04 NOTE — Progress Notes (Signed)
  Phone Note Other Incoming   Summary of Call: Discusssed case with [name] CSW in inpatient.  He is trying to discharge [name] from the hospital and transition her to [facility] in [place] for drug treatment.  She is not wanting to go and wants to ret to [facility] but Edwyna Ready said [facility] not willing to take Cathe back without letter of guarantee.   Told [name] I would attempt to call attorney re: status of her disability case.

## 2010-11-04 NOTE — Progress Notes (Signed)
  Phone Note Outgoing Call   Call placed by: Soc. Work Architectural technologist of Call: LEft message for [employer].

## 2010-11-04 NOTE — Miscellaneous (Signed)
Summary: [facility]   [facility]   Imported By: Garlan Fillers 03/18/2010 11:40:38  _____________________________________________________________________  External Attachment:    Type:   Image     Comment:   External Document

## 2010-11-04 NOTE — Progress Notes (Signed)
Summary: Soc. Work  Dealer placed by: Soc. Work Call placed to: Patient Summary of Call: [phone]   Patient paged but no response.   03/12/10  Contacted patient and she said she had MH appmt but didn't know when.  I asked her if she would meet with me so we could assess and sort out appts.  She said to call [facility] and arrange transportation.   Follow-up for Phone Call        Appmt schedule for Fridayb 10 AM so SW can assess and sort out what resources she needs.

## 2010-11-04 NOTE — Initial Assessments (Signed)
INTERNAL MEDICINE ADMISSION HISTORY AND PHYSICAL  Attending: Dr. Lynnae January First contact: Dr. Shon Baton [phone] Second contact: Dr. 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 She has continued to have vasculitic skin lesions. Pt was recently admitted 01/30/10 with LE ulcers and skin necrosis in the feet bilaterally felt to be the result of levamisole. Pt went to the clinic today with complaint of pain all over her body. Upon admission, she complained of diffuse pain and was found to have a 2cm x 2cm ulcer with a bed of granulation tissue on her left lateral thigh. The patient claims that this lesion started about 3 weeks ago and swelled to the size of a marble.  She states that two days ago she was able to express blood and pus from the lesion. The lesion is painful (10/10 with manipulation, 7/10 constantly). She also complains of multiple painful indurated 1cm nodules that she claims come and go over the time course of about 3 days.  She has a long standing history of these nodules but they have acutely worsened over the last week. The lesions are primarily located on her upper extremities and head.  The patient continues to use cocaine and last used 2 weeks ago due to her depression. Importantly, the patient has been previously, living in [place] and was to be discharged today, however, she continues to have no place to live. Patient is unable to rise from bed without assistance but is able to ambulate with a cane. At this point, she denies any nausea/vomiting, decreased UOP, changes in her bowel movements, fever, headache, cough, or abdominal pain.    ALLERGIES: Tylenol: "patient swells up"    PAST MEDICAL HISTORY: levimasole toxicity from crack/cocaine use      -thought initially to be relapsing polychondritis      -confirmed by levimasole levels and auto antibodies      -Being followed by Dr. 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   Head:  atraumatic with a 1 cm subcutaneous nodule on the right occiput.    Eyes:  vision grossly intact, pupils equal, pupils round, pupils reactive to light, no injection and anicteric.   Mouth:  pharynx pink and moist, no erythema, and no exudates.   Neck:  supple, full ROM, no thyromegaly, no JVD, and no carotid bruits.   Lungs:  normal respiratory effort, no accessory muscle use, normal breath sounds, no crackles, and no wheezes.  Heart:  normal rate, regular  rhythm, no murmur, no gallop, and no rub.   Abdomen:  soft, non-tender, normal bowel sounds, no distention, no guarding, no rebound tenderness, no hepatomegaly, and no splenomegaly.   Msk:  no joint swelling, no joint warmth, and no redness over joints.   Pulses:  2+ DP/PT pulses bilaterally Extremities:  No cyanosis, clubbing, edema  Neurologic:  alert & oriented X3, cranial nerves II-XII intact, strength normal in all extremities, sensation intact to light touch, and gait normal.    Skin:  turgor normal, diffuse scaring on the lower extremities due to prior  levimasole toxisity. Degenerated cartilage in the pena of ears billatearlly. Multiple hard, indurated nodules which are approximently 1cm located on the anter and posterior portions of the forearms billaterally.  A single 2 cm x 2cm ulcer with a well healing bed of pink granulation tissue on the left lateral thigh.  Psych:  Oriented X3,  normally interactive, good eye contact, not anxious appearing but appears depressed and was tearful at times during examination.   LABS: CBC+Diff  WBC                                      3.4        l      4.0-10.5         K/uL  RBC                                      3.86       l      3.87-5.11        MIL/uL  Hemoglobin (HGB)                         11.3       l      12.0-15.0        g/dL  Hematocrit (HCT)                         33.0       l      36.0-46.0        %  MCV                                      85.5              78.0-100.0       fL  MCH -                                    29.4              26.0-34.0        pg  MCHC                                     34.3              30.0-36.0        g/dL  RDW  18.4       h      11.5-15.5        %  Platelet Count (PLT)                     350               150-400          K/uL  Neutrophils, %                           71                43-77            %  Lymphocytes, %                           26                12-46            %  Monocytes, %                             2          l      3-12             %  Eosinophils, %                           1                 0-5              %  Basophils, %                             0                 0-1              %  Neutrophils, Absolute                    2.4               1.7-7.7          K/uL  Lymphocytes, Absolute                    0.9               0.7-4.0          K/uL  Monocytes, Absolute                      0.1               0.1-1.0           K/uL  Eosinophils, Absolute                    0.0               0.0-0.7          K/uL  Basophils, Absolute                      0.0  0.0-0.1          K/uL  CMET  Sodium (NA)                              137               135-145          mEq/L  Potassium (K)                            4.2               3.5-5.1          mEq/L  Chloride                                 103               96-112           mEq/L  CO2                                      25                19-32            mEq/L  Glucose                                  154        h      70-99            mg/dL  BUN                                      8                 6-23             mg/dL  Creatinine                               0.59              0.4-1.2          mg/dL  GFR, Est Non African American            >60               >60              mL/min  GFR, Est African American                >60               >60              mL/min    Oversized comment, see footnote  1  Bilirubin, Total                         0.3               0.3-1.2  mg/dL  Alkaline Phosphatase                     61                39-117           U/L  SGOT (AST)                               18                0-37             U/L  SGPT (ALT)                               11                0-35             U/L  Total  Protein                           6.9               6.0-8.3          g/dL  Albumin-Blood                            3.2        l      3.5-5.2          g/dL  Calcium                                  9.5               8.4-10.5         mg/dL   UDS Amphetamins                              SEE NOTE.         NDT    NONE DETECTED  Barbiturates                             SEE NOTE.         NDT    Oversized comment, see footnote  1  Benzodiazepines                          SEE NOTE.         NDT    NONE DETECTED  Cocaine                                  POSITIVE   a      NDT  Opiates                                   POSITIVE   a      NDT  Tetrahydrocannabinol  SEE NOTE.         NDT    NONE DETECTED  UA  Color, Urine                             YELLOW            YELLOW  Appearance                               TURBID     a      CLEAR  Specific Gravity                         1.025             1.005-1.030  pH                                       6.0               5.0-8.0  Urine Glucose                            NEGATIVE          NEG              mg/dL  Bilirubin                                NEGATIVE          NEG  Ketones                                  NEGATIVE          NEG              mg/dL  Blood                                    NEGATIVE          NEG  Protein                                  NEGATIVE          NEG              mg/dL  Urobilinogen                             1.0               0.0-1.0          mg/dL  Nitrite                                  NEGATIVE          NEG  Leukocytes  NEGATIVE          NEG  Squamous Epithelial / LPF                MANY       a      RARE  WBC / HPF                                3-6               <3               WBC/hpf  RBC / HPF                                0-2               <3               RBC/hpf  Bacteria / HPF                           MANY       a      RARE  Urine-Other                              SEE NOTE.    MUCOUS PRESENT  STUDIES: Hip X-ray  IMPRESSION:   Mild degenerative arthritic changes of the left hip.  No acute   abnormalities.  ASSESSMENT AND PLAN: This is a 50 year old female with a history of relapsing polychondritis who presents with a 2x2 cm hip ulceration and multiple subcutaneous nodules in the setting of recent cocaine use.   1.Inadaquate material resources:  Pt was to be discharged from her ALF today as she has used up her benefits, however, pt has no home to return to.  Will consult social work and CM to determine whether patient is able to return to [facility] after this  hospitalization or whether she will need other placement.    2.Vasculitis with persistent skin ulceration and subcutaneous skin nodules. The differential diagnosis of these subcutaneous nodules is broad, however, given the persistent skin ulceration, it is felt that they are likely the result of an underlying vasculitis.  In the setting of a continued history of cocaine use and documented levimasole toxicity it is felt that the patient's symptoms are the result of continued toxicity.  The patient has been counseled regarding the importance of refraining from the use of cocaine and understands its risks. Wound care will be consulted for wound. Due to the patients hip pain we will order a plain film x-ray of the complete hip for evaluation.   3. Neutropenia: Pt has had a decline in her white count since May 2011, the count dropped from 9.9 May1st to 3.7 by the May 25th.  The white count has remained about steady since this time and is currently 3.4. This neutropenia is felt to be secondary to levamisole toxicity.  The patient was HIV negative at last discharge 02/26/10, however, we will reorder HIV antibody, blood cultures x 2.   4. Anemia: HGB is improved from last admission. We will order CBC, peripheral smear, and anemia panel.   5. Cocaine Abuse: Pt has been counseled numerous times regarding the importance of abstaining from drugs  of abuse.  Psychiatry  saw her at last admission and felt that she understood her problem and therefore would not go back to using cocaine.  6.Chronic pain syndrome: Continue percocet 5/325 1-2 by mouth Q6H  as needed for pain.   7. HTN: Continue home medications  8. DEPRESSION/ANXIETY: Continue home medications at prescribed dosage.   9. VTE PROPH: lovenox   Attending Attachment: I have seen and examined the patient. I reviewed the resident/fellow note and agree with the findings and plan of care as documented. My additions and revisions are included.     Name:  Date:

## 2010-11-04 NOTE — Miscellaneous (Signed)
Summary: Hospital Admission  INTERNAL MEDICINE ADMISSION HISTORY AND PHYSICAL Attending: Dr. Megan Salon First Contact: Dr. Valetta Close [phone] Second Contact: Dr. Ina Homes [phone]  PCP: Dr. 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  The patient was recently discharged from our service on 06/30/10 for similar lesions of the ears and was discharged in improving condition.  Because of a previous unwillingness to live at [facility], the patient was discharged to the [facility] which had an open bed at the time, however, by the time the patient arrived all beds were full.  As a result, the patient went back to her typical spot where she lives when she is homeless.  The patient denies any cocaine use until yesterday.  Last night, the patient, developed worsening ear pain, as well as bilateral face pain.  On admission today, the patient has bilateral necrotic cheek lesions as well as worsening necrosis of the penna bilaterally.  The patient is also complaining of blood from her nose.  The patients pain is sharp, constant and a 10/10 in intensity.  She currently denies any CP,SOB, joint pain, abdominal pain or change in her BMS.    ALLERGIES: ! 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 Louanne Skye HTN - not currently requiring any medication Polysubstance abuse Depression hx Vitamin B12 deficiency  MEDICATIONS: SERTRALINE HCL 50 MG TABS (SERTRALINE HCL) Take 1 tablet by mouth once a day VITAMIN B-12 1000 MCG TABS (CYANOCOBALAMIN) Take 1 tablet by mouth once a day OXYCODONE HCL 10 MG TABS (OXYCODONE HCL) Take 1 tab every 6 hours as needed for pain. BENADRYL 25 MG CAPS (DIPHENHYDRAMINE HCL) 1 tab every 6 hrs as needed for  itching MULTIVITAMINS  CAPS (MULTIPLE VITAMIN) Take 1 tablet by mouth once a day   SOCIAL HISTORY: Unemployed:  cleaning in past Homeless prior crack/cocaine use tobacco:  1/2 ppd alcohol:  none   FAMILY HISTORY Colon cancer? in aunt breast cancer in mother  ROS: Negative as per HPI.  VITALS: T: 97.9 P:108  BP:159/87  R:24  O2SAT: 100% ON: RA  PHYSICAL EXAM: General:  alert, thin, and cooperative to examination, extremely tearful with significant distress.   Head:  There are 5-6 cm bilateral necrotic lesions on the cheek bilaterally with surrounding rim of erythema.  Eyes:  vision grossly intact, pupils equal, pupils round, pupils reactive to light, no injection and anicteric.   Mouth:  There are two small 1-96m necrotic lesions at the vermillion border, pharynx pink and moist, no erythema, and no exudates.   Nose: There is blood in the nose with bilateral septal lesions which are not freely bleeding. Neck:  supple, full ROM, no thyromegaly, no JVD, and no carotid bruits.   Lungs:  normal respiratory effort, no accessory muscle use, normal breath sounds, no crackles, and no wheezes.  Heart:  normal rate, regular rhythm, no murmur, no gallop, and no rub.   Abdomen:  soft, non-tender, normal bowel sounds, no distention, no guarding, no rebound tenderness, no hepatomegaly, and no splenomegaly.   Msk:  no joint swelling, no joint warmth, and no redness over joints.   Pulses:  2+ DP/PT pulses bilaterally Extremities:  No cyanosis, clubbing, edema There is a 1cm complex  necrotic lesion of the right thigh, as well as a 2 cm subcutaneous nodule on the left forearm distal to the olecranon process. Neurologic:  alert & oriented X3, cranial nerves II-XII intact, strength normal in all extremities, sensation intact to light touch, and gait normal.   Skin:  turgor normal and no rashes.   Psych:  Oriented X3, memory intact for recent and remote, normally interactive, good eye contact, not  anxious and distressed appearing.   LABS:  BMET:  Sodium (NA)                              138               135-145          mEq/L  Potassium (K)                            3.1        l      3.5-5.1          mEq/L  Chloride                                 102               96-112           mEq/L  CO2                                      26                19-32            mEq/L  Glucose                                  97                70-99            mg/dL  BUN                                      12                6-23             mg/dL  Creatinine                               0.80              0.4-1.2          mg/dL  GFR, Est Non African American            >60               >60              mL/min  GFR, Est African American                >60               >60  mL/min    Oversized comment, see footnote  1  Calcium                                  9.1               8.4-10.5         mg/dL  CBC: WBC                                      6.0               4.0-10.5         K/uL  RBC                                      3.96              3.87-5.11        MIL/uL  Hemoglobin (HGB)                         11.1       l      12.0-15.0        g/dL  Hematocrit (HCT)                         32.5       l      36.0-46.0        %  MCV                                      82.1              78.0-100.0       fL  MCH -                                    28.0              26.0-34.0        pg  MCHC                                     34.2              30.0-36.0        g/dL  RDW                                      14.7              11.5-15.5        %  Platelet Count (PLT)                     491        h      150-400          K/uL  Neutrophils, %  80         h      43-77            %  Lymphocytes, %                           16                12-46            %  Monocytes, %                             4                 3-12             %  Eosinophils, %                            1                 0-5              %  Basophils, %                             0                 0-1              %  Neutrophils, Absolute                    4.8               1.7-7.7          K/uL  Lymphocytes, Absolute                    0.9               0.7-4.0          K/uL  Monocytes, Absolute                      0.2               0.1-1.0          K/uL  Eosinophils, Absolute                    0.0               0.0-0.7          K/uL  Basophils, Absolute                      0.0               0.0-0.1          K/uL  Studies: None  ASSESSMENT AND PLAN: 49yo with with levamisole-associated vasculitis admitted with increased pain/swelling of her ears as well as new facial lesions bilaterally.   1. Levamisole-associated vasculitis. Will provide supportive management with wound care consult if needed.  Will manage pain with Oxycodone 15 mg Q6 hours as needed for pain.  Patient has been treated with steroids in the past with questionable efficacy. During her last admission, steroids were held with resolution of her vasculitic ear lesions over  a similar time frame as previous.  As a result, we will hold the patients steroids for now.   2. Hypokalemia: Will replete with 40 meq KCL by mouth for now with repeat BMET in am.   3. Non-healing shin wound: Continues to remain stable with no clear decrease in size.  Wound care has evaluated in the past and recommended daily wet to dry dressing changes.  This will be continued while patient is in the hospital.   4. Cocaine abuse. Ms. Tessler is motivated to change drug behaviors but has been declined by [facility] inpt rehab because of her non-healing shin wound.  Pt has been counseled multiple times regarding the dangers of her cocaine use, however, thus far she has been unable to abstain.   5. Depression. Continue sertraline.   6. Hypertension. Has been stable during past hospitalizations and pt has not been using antihypertensives outside of the  hospital. Will continue to monitor here.  7. Vit B12 deficiency. Continue supplementation.   8. DVT Prophylaxis: lovenox  9. Dispo:  Pt is not able to be admitted to a SNF or ALF because of previous history of unwillingness to stay.  We will continue to attempt to find an appropriate housing/rehabilitation facility upon discharge.   Attending Physician: I have seen and examined the patient. I reviewed the resident/fellow note and agree with the findings and plan of care as documented. My additions and revisions are included.  Name:   Date:

## 2010-11-04 NOTE — Miscellaneous (Signed)
Summary: [facility]  [facility]   Imported By: Enedina Finner 06/30/2010 16:30:31  _____________________________________________________________________  External Attachment:    Type:   Image     Comment:   External Document

## 2010-11-04 NOTE — Miscellaneous (Signed)
Summary: Hospital Admission  Clinical Lists Changes INTERNAL MEDICINE ADMISSION HISTORY AND PHYSICAL Attending: Dr. Bertha Stakes First Contact: Dr. Harlow Mares [phone] Second Contact: Dr. Wendee Beavers [phone]  PCP: Dr. Rikki Spearing  CC: pain/swelling of ears, painful subcutaneous nodules on arms and legs  HPI: [name] is a 50yo W who was taken to the ED by EMS with increasingly painful subcutaneous nodules and swelling of her ears. 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 She was previously hospitalized in July 2011 for painful subcutaneous nodules associated with this vasculitis. This necrotizing vasculitis has caused significant disfigurement of her bilateral outer ear cartilage. She is followed in the [facility] and is in the process of being referred to a rheumatologist as an outpatient. She was last seen in clinic three days ago on August 18th, at which time she was started on a 10-day course of Bactrim because of concerns that a draining nodule on her L shin, which was previously incised and drained, had become infected. She has a follow-up appointment with surgery scheduled for later this week. On the morning of admission, she noted increased pain and swelling of her ear cartilage as well as increased pain associated with the subcutaneous nodules on her arms and legs. She also continues to have drainage from the nodule on her L shin. She denies fever, chills, nausea, or vomiting. She also complains of swelling and warmth of her L knee but says that this has not changed over the past several weeks and that surgery evaluated this knee and decided against arthrocentesis.   ALLERGIES: Allergies: ! 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  prior crack/cocaine use. Recently used cocaine on 8/17 after several weeks of abstinence. Also uses marijuana regularly.  tobacco:  1/2 ppd alcohol:  none  FAMILY HISTORY Family History: Colon cancer? in aunt breast cancer in mother  ROS:per HPI  VITALS: T: 98.2 P: 111 BP: 151/93 R: 20 O2SAT: 97% ON: RA PHYSICAL EXAM: General:  alert, very thin, tearful, and cooperative to examination.   Ears: contour of helix is very irregular secondary to necrotic vasculitis, helix appears somewhat swollen and is tender to light touch  Eyes:  pupils equal, pupils round, pupils reactive to light, no injection and anicteric.   Mouth: pharynx pink and moist, no erythema, and no exudates. poor dentition. Neck: supple, full ROM.   Lungs: normal respiratory effort, no accessory muscle use, normal breath sounds, no crackles, and no wheezes.  Heart:  normal rate, regular rhythm, no murmur, no gallop, and no rub.   Abdomen:  soft, non-tender, normal bowel sounds, no  distention, no guarding, no rebound tenderness. Msk: mild swelling of L knee with no significant amount of subpatellar fluid appreciated.   Pulses:  2+ DP/PT pulses bilaterally Extremities:  No cyanosis, clubbing, edema. Extremities are very thin. Healed scars on arms, abdomen, hip, lower  legs, and feet. L anterior shin open wound with serous drainage and mild erythema -- bandaged. R heel wound with serous drainage -- bandaged.    Neurologic:  alert & oriented X3, cranial nerves II-XII intact, strength normal in all extremities, sensation intact to light touch.   Skin:  turgor normal and no rashes.   Psych:  Oriented X3, tearful when discussing health and drug history.   LABS:  WBC                                      4.1               4.0-10.5         K/uL  RBC                                      3.87              3.87-5.11        MIL/uL  Hemoglobin (HGB)                         11.2       l      12.0-15.0        g/dL  Hematocrit (HCT)                         33.1       l      36.0-46.0        %  MCV                                      85.5              78.0-100.0       fL  MCH -                                    28.9              26.0-34.0        pg  MCHC                                     33.8              30.0-36.0        g/dL  RDW                                      15.8       h      11.5-15.5        %  Platelet Count (PLT)  213               150-400          K/uL  Neutrophils, %                           90         h      43-77            %  Lymphocytes, %                           8          l      12-46            %  Monocytes, %                             2          l      3-12             %  Eosinophils, %                           0                 0-5              %  Basophils, %                             0                 0-1              %  Neutrophils, Absolute                    3.7               1.7-7.7          K/uL  Lymphocytes, Absolute                    0.3        l      0.7-4.0          K/uL  Monocytes, Absolute                      0.1               0.1-1.0          K/uL  Eosinophils, Absolute                    0.0               0.0-0.7          K/uL  Basophils, Absolute                      0.0               0.0-0.1           K/uL   Sodium (NA)  131        l      135-145          mEq/L  Potassium (K)                            3.5               3.5-5.1          mEq/L  Chloride                                 101               96-112           mEq/L  CO2                                      21                19-32            mEq/L  Glucose                                  154        h      70-99            mg/dL  BUN                                      10                6-23             mg/dL  Creatinine                               0.76              0.4-1.2          mg/dL  GFR, Est Non African American            >60               >60              mL/min  GFR, Est African American                >60               >60              mL/min    Oversized comment, see footnote  1  Bilirubin, Total                         0.4               0.3-1.2          mg/dL  Alkaline Phosphatase                     72                39-117  U/L  SGOT (AST)                               32                0-37             U/L  SGPT (ALT)                               23                0-35             U/L  Total  Protein                           6.6               6.0-8.3          g/dL  Albumin-Blood                            3.2        l      3.5-5.2          g/dL  Calcium                                  8.8               8.4-10.5         mg/dL  ASSESSMENT AND PLAN: 50yo W with levamisole-associated vasculitis admitted with increased pain of her ears and subcutaneous nodules on her arms and legs.  1. Levamisole-associated vasculitis. Will consult rheumatology regarding management. Was recently treated with a long prednisone taper in July. However, per a previous note by Dr. Lynnae January, corticosteroids are not thought to be beneficial in levamisole-associated vasculitis. Therefore, we will hold steroids for now. Continue treatment with Bactrim initiated by PCP for presumed bacterial (possibly  community-acquired MRSA) superinfection of open wounds. Get repeat wound culture.  2. Cocaine abuse. Ms. Kandler is motivated to change drug behaviors and is apparently on a waiting list for inpatient rehab. However, recent cocaine use is concerning. Will consult social work for substance abuse counseling.  3. Depression. Continue sertraline.  4. Hypertension. Continue home regimen of carvedilol. 5. Vit B12 deficiency. Continue supplementation.  6. DVT Prophylaxis: lovenox  Attending Physician: I have seen and examined the patient. I reviewed the resident/fellow note and agree with the findings and plan of care as documented. My additions and revisions are included.   Signature:   Medications: Added new medication of CARVEDILOL 12.5 MG TABS (CARVEDILOL) Take 1 tablet by mouth two times a day - Signed

## 2010-11-04 NOTE — Assessment & Plan Note (Signed)
Summary: HFU/SB.   Vital Signs:  Patient profile:   50 year old female Height:      61.5 inches Weight:      92.4 pounds BMI:     17.82 Temp:     97.2 degrees F oral Pulse rate:   91 / minute BP sitting:   123 / 71  (left arm) Cuff size:   small  Vitals Entered By: Lucky Rathke NT II (July 30, 2010 11:39 AM) CC: HOSPITAL FOLLOWUP / pt st's she has dried blood in nose at night Is Patient Diabetic? No Pain Assessment Patient in pain? yes     Location: UNDER ARM Intensity: 10 Type: aching Onset of pain  2-3 weeks ago Nutritional Status BMI of < 19 = underweight  Have you ever been in a relationship where you felt threatened, hurt or afraid?No   Does patient need assistance? Functional Status Self care Ambulation Normal Comments hosptial followup   Primary Care Provider:  Rikki Spearing, MD  CC:  HOSPITAL FOLLOWUP / pt st's she has dried blood in nose at night.  History of Present Illness: 50 yr old woman with pmhx as described below comes to the clinic for HFu. Patient reports not to be taking any medicaiton. She can not afford to pay for anything. Continues to complain of pain leg, arm, and ear.   Patient reports to have a shingles under her left arm that started 2 weeks ago.   Patient has an appointment with Rheumatologist at [facility] November 18. She does not know at what time.   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 Take 1 Capsule By Mouth Once A Day For Depression 2)  Vitamin B-12 1000 Mcg Tabs (Cyanocobalamin) .... Take 1 Tablet By Mouth Once A Day 3)  Benadryl 25 Mg Caps (Diphenhydramine Hcl) .Marland Kitchen.. 1 Tab Every 6 Hrs As Needed For Itching 4)  Multivitamins  Caps (Multiple Vitamin) .... Take 1 Tablet By Mouth Once A Day  Current Medications (verified): 1)  Fluoxetine Hcl 20 Mg Caps (Fluoxetine Hcl) .... Take 1 Capsule By Mouth Once A Day For Depression 2)  Vitamin B-12 1000 Mcg Tabs (Cyanocobalamin) .... Take 1 Tablet By Mouth Once A Day 3)  Benadryl 25 Mg Caps (Diphenhydramine Hcl) .Marland Kitchen.. 1 Tab Every 6 Hrs As Needed For Itching 4)  Multivitamins  Caps (Multiple Vitamin) .... Take 1 Tablet By Mouth Once A Day 5)  Acyclovir 800 Mg Tabs (Acyclovir) .... Take 1 Tablet By Mouth Every 4 Hours X 7 Days 6)  Tramadol Hcl 50 Mg Tabs (Tramadol Hcl) .... Take 1 Tablet By Mouth Every 6 Hours As Needed For Pain  Allergies: 1)  ! 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 Louanne Skye HTN - not currently requiring any medication Polysubstance abuse Depression  Past Surgical History: Last updated: 05/08/2010 7/11 - biopsies of bilateral shin nodules  Family History: Last updated: 05/08/2010 Colon cancer? in aunt breast cancer in mother  Social History: Last updated: 03/06/2010 Unemployed:  cleaning in past Living at [place] prior crack/cocaine use tobacco:  1/2 ppd alcohol:  none  Risk Factors: Exercise: yes (07/30/2010)  Risk Factors: Smoking Status: current (07/30/2010) Packs/Day: 4-5  CIGS PER DAY (07/30/2010)  Family History: Reviewed history from  05/08/2010 and no changes required. Colon cancer? in aunt breast cancer in mother  Social History: Reviewed history from 03/06/2010 and no changes required. Unemployed:  cleaning in past Living at [place] prior crack/cocaine use tobacco:  1/2 ppd alcohol:  none  Review of Systems  The patient denies fever, chest pain, dyspnea on exertion, hemoptysis, abdominal pain, melena, hematochezia, hematuria, muscle weakness, and difficulty walking.    Physical Exam  General:  NAD Ears:  2 small 1-2 mm necrotic lesions on the vermilion border Mouth:  MMM Neck:  supple and full ROM.   Lungs:  normal respiratory effort, no accessory muscle use, normal breath sounds, no crackles, and no wheezes.   Heart:  normal rate, regular rhythm, and no murmur.   Abdomen:  soft and non-tender.  thin. Msk:  no joint swelling, no joint warmth, and no redness over joints.   Extremities:  no edema Neurologic:  alert & oriented X3.   Skin:  5-6 cm bilateral necrotic lesion of the cheeks with surrounding rim of erythema Vesicles noted under left armpit   Impression & Recommendations:  Problem # 1:  HERPES ZOSTER (ICD-053.9) Start patient on acyclovir and tramadol for pain control. Will follow up. Will have Butch Penny help patient will medications. Patient can also get medication for free because she is homeless at [place].  Problem # 2:  DEPRESSION, SEVERE (ICD-311) Patient is not taking medication. Denies SSI/HSI. Will restart her on Fluoxetine and follow up in one month.   Her updated medication list for this problem includes:    Fluoxetine Hcl 20 Mg Caps (Fluoxetine hcl) .Marland Kitchen... Take 1 capsule by mouth once a day for depression  Problem # 3:  VASCULITIS (ICD-447.6) Patient will see Rheumatolog on November 18 @ 2pm. Will see if Social Worker can help her with transportation.  Orders: Social Work Referral (Social )  Problem # 4:  POISONING BY COCAINE (ICD-970.81) Continued to  encourage abstinence.   Complete Medication List: 1)  Fluoxetine Hcl 20 Mg Caps (Fluoxetine hcl) .... Take 1 capsule by mouth once a day for depression 2)  Vitamin B-12 1000 Mcg Tabs (Cyanocobalamin) .... Take 1 tablet by mouth once a day 3)  Benadryl 25 Mg Caps (Diphenhydramine hcl) .Marland Kitchen.. 1 tab every 6 hrs as needed for itching 4)  Multivitamins Caps (Multiple vitamin) .... Take 1 tablet by mouth once a day 5)  Acyclovir 800 Mg Tabs (Acyclovir) .... Take 1 tablet by mouth every 4 hours x 7 days 6)  Tramadol Hcl 50 Mg Tabs (Tramadol hcl) .... Take 1 tablet by mouth every 6 hours as needed for pain  Patient Instructions: 1)  Please schedule a follow-up appointment in 1 month. 2)  Take all medicaiton as directed. 3)  Go to your appointment with Rheumatologist on August 22, 2010 @ 2pm Prescriptions: FLUOXETINE HCL 20 MG CAPS (FLUOXETINE HCL) Take 1 capsule by mouth once a day for depression  #90 x 1   Entered and Authorized by:   Rudie Meyer MD   Signed by:   Rudie Meyer MD on 07/30/2010   Method used:   Print then Give to Patient   RxID:   [id number] TRAMADOL HCL 50 MG TABS (TRAMADOL HCL) Take 1 tablet by mouth every 6 hours as needed for pain  #90 x 0   Entered and Authorized by:   Rudie Meyer MD   Signed by:   Rudie Meyer MD on 07/30/2010   Method used:   Print then Give to Patient   RxID:   [id number] ACYCLOVIR 800 MG TABS (ACYCLOVIR) Take 1 tablet by mouth every 4 hours X 7 days  #30 x 0   Entered and Authorized by:   Rudie Meyer MD   Signed by:   Rudie Meyer MD on 07/30/2010   Method used:   Print then Give to Patient   RxID:   [id number]    Orders Added: 1)  Social Work Referral Lavena Bullion ] 2)  Est. Patient Level III [99872]     Prevention & Chronic Care Immunizations   Influenza vaccine: Not documented    Tetanus booster: Not documented    Pneumococcal vaccine: Not documented  Other  Screening   Pap smear: Not documented   Pap smear action/deferral: Deferred  (05/08/2010)    Mammogram: Not documented   Mammogram action/deferral: Deferred  (05/08/2010)   Smoking status: current  (07/30/2010)  Lipids   Total Cholesterol: Not documented   LDL: Not documented   LDL Direct: Not documented   HDL: Not documented   Triglycerides: Not documented  Hypertension   Last Blood Pressure: 123 / 71  (07/30/2010)   Serum creatinine: Not documented   Serum potassium Not documented  Self-Management Support :   Personal Goals (by the next clinic visit) :      Personal blood pressure goal: 140/90  (03/06/2010)   Patient will work on the following items until the next clinic visit to reach self-care goals:     Medications and monitoring: take my medicines every day, check my blood pressure, bring all of my medications to every visit, weigh myself weekly  (05/22/2010)     Eating: drink diet soda or water instead of juice or soda, eat more vegetables, use fresh or frozen vegetables, eat baked foods instead of fried foods, eat fruit for snacks and desserts, limit or avoid alcohol  (07/30/2010)     Activity: take a 30 minute walk every day  (07/30/2010)    Hypertension self-management support: Resources for patients handout  (07/30/2010)      Resource handout printed.

## 2010-11-04 NOTE — Miscellaneous (Signed)
Summary: hosp d/c  Hospital Discharge  Date of admission: 11/26/09  Date of discharge: 12/02/09  Brief reason for admission/active problems: Ms. Sampley was admitted for multiple recurrent skin abscesses associated with fever. She was started on vancomycin until wound culture grew MSSA, at which time she was switched to Ancef. Following a temperature spike to 100.5 on the first night of hospitalization, she has been afebrile. We will d/c on doxycycline as the patient states she thinks she already has a prescription for this. I will write another one just in case.  Followup needed: She will follow-up at [facility] with Dr. Nedra Hai on Thursday, March 10 @ 10:15 am. At this appointment, please ensure she is still taking her doxycycline and that the skin lesions continue to improve. If time allows, please also assess whether or not the patient has submitted an application for permanent housing as she will be unable to stay at [facility] beyond March 31. She was given the contact info for this during her stay.   The medication and problem lists have been updated.  Please see the dictated discharge summary for details.   Complete Medication List: 1)  Doxycycline Hyclate 100 Mg Tabs (Doxycycline hyclate) .... Take 1 tablet twice daily for 14 days 2)  Vicodin 5-500 Mg Tabs (Hydrocodone-acetaminophen) .Marland Kitchen.. 1 tab every 4-6 hrs as needed for pain (prescribed by ed doctor 11/24/09)   Patient Instructions: 1)  You have a follow-up appointment at [facility] with Dr. Nedra Hai on [place], [place] 10 @ 10:15 am. It is VERY important that you keep this appointment. 2)  I have written a prescription for an antibiotic called doxycycline. The hospital will provide for you 3 days worth. You must take a total of 14 days, which means that you must get 11 days worth of this medication. If the church was unable to get doxycycline for you, I have included a prescription for you to fill.  3)  It is VERY important  that you take this medicaton as directed for the full 14 days.

## 2010-11-04 NOTE — Progress Notes (Signed)
  Phone Note Other Incoming   Caller: ED physician [facility] Reason for Call: Confirm/change Appt Summary of Call: I received call from Dr. Sabra Heck ED physician at [facility] requesting a followup appointment for [name]; who came to the ED complaining of inttermitent CP X2 days, associated with burning sensation, mid sternum and right side. Pain started after increased used of ibuprofen (which she started using for increased pain in her arms due to vascular polyartrhitis) and also relapsed of cocaine use. Patient had negative CXR, normal cardiac enzymes, no ischemic changes on her EKG and normal vital signs; she received pain medication in the ED and was started on a PPI for presumably GERD as cause of her CP. Request of followup at [facility] next week was requested to follow on her symptoms and to provide  standard prescriptions.  Please arrange followup and contact patient with appointment details....  Thanks!!!!!

## 2010-11-04 NOTE — Progress Notes (Signed)
Summary: Soc. Work  Surveyor, minerals of Call: 15 min.  S[facility] case mgr from ER called about patient inquiring about how Tashay can get her medications.  I told her that she should immediately recertify with Marlana Latus downstairs and then she can get her meds at the [facility].  She is to tell them that she is homeless and they will waive the copays. Sandy's beeper is [phone] and she has my phone number for any other issues that come about.   Follow-up for Phone Call        [place] called back and said that she has the ability to fill her acyclovir and Annaliah will come in next week and meet with Marlana Latus for recertification. Katy Fitch  July 31, 2010 10:35 AM

## 2010-11-04 NOTE — Discharge Summary (Signed)
Summary: Hospital Discharge Update    Hospital Discharge Update:  Date of Admission: 06/25/2010 Date of Discharge: 06/27/2010  Brief Summary:  This is a 50 year old female with hx levamisole induced vasculitis who presents because of worsening vasculitic lesions of the ear, right 2nd finger, and scalp after using recent cocaine.  The patient was treated supportively while in the hospital for her lesions, however, troponin was found to be slightly elevated (CK-MB WNL).  The patient's troponins trended down during the admission, and there were no new EKG findings. Echo was performed which did not show any wall motion abnormalities. Steroids were not started on this admission.  Lab or other results pending at discharge:  Blood cultures negative to date.  Labs needed at follow-up: CBC with differential  Medication list changes:  Rx of OXYCODONE HCL 10 MG TABS (OXYCODONE HCL) Take 1 tab every 6 hours as neede for pain.;  #20 x 0;  Signed;  Entered by: Jola Schmidt MD;  Authorized by: Jola Schmidt MD;  Method used: Print then Give to Patient  Discharge medications:  SERTRALINE HCL 50 MG TABS (SERTRALINE HCL) Take 1 tablet by mouth once a day VITAMIN B-12 1000 MCG TABS (CYANOCOBALAMIN) Take 1 tablet by mouth once a day OXYCODONE HCL 10 MG TABS (OXYCODONE HCL) Take 1 tab every 6 hours as neede for pain.  Other patient instructions:  Please come for an appointment at the outpatient clinic at [facility] on 10/5 at 4:00 pm with Dr. Burnard Hawthorne for a followup visit.  Please take your medication as prescribed below.  If you have any problem, Please call the clinic.   In case of an emergency  dial 911 or go to the emergency department.

## 2010-11-04 NOTE — Miscellaneous (Signed)
Summary: Hospital Admission: 06/25/2010 - fever, malaise  INTERNAL MEDICINE ADMISSION HISTORY AND PHYSICAL ADMISSION DATE: 06/25/2010 ATTENDING: DR. Lars Mage R1 - DR.  R2 - DR. DEVANI -   PCP: DR. Shon Baton  CC: PAIN IN EARS  HPI: Patient is 50 yo female with PMH outlined below, who presents to [facility] ED with main concern of bilateral ear pain that started evening of admission. She has had similar episodes in the past and the flares are provoked by cocaine use which she admits to. In addition, she reports generalized body aches, most prominent in lower extremities and earlier in the AM she has had difficulty with walking due to pain. Reports subjective fevers, chills, sweats, recent ? viral flu with productive cough of yellowish sputum and small amount of blood in it, associated with right sided mid axillary area pain. No specific aggrevating or alleviating factors. No recent sick contacts, no traumas, no traveling.  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 Louanne Skye HTN - not currently requiring any medication Polysubstance abuse Depression  MEDICATIONS: SERTRALINE HCL 50 MG TABS Take 1 tablet by mouth once a day VITAMIN B-12 1000 MCG TABS Take 1 tablet by mouth once a day PERCOCET 10-325 MG Take 1-2 tablets by mouth every 6 hours as needed for pain.  SOCIAL HISTORY: Unemployed:  cleaning in past Living at [place] prior crack/cocaine use tobacco:  1/2 ppd alcohol:  none   FAMILY HISTORY: Colon cancer? in aunt breast cancer in mother  ROS: per HPI  VITALS: T:    101.1 F P:    115/min  BP: 126/81 mmHg   R:    20/min O2SAT: 99% ON: RA  PHYSICAL EXAM: GENERAL:  The patient was alert, well developed, and cooperative to examination HEAD:  Normocephalic and atraumatic.  EYES:  Her vision  was grossly intact.  Pupils were equal, round, and poorly reactive to light.  EARS:  There was bilateral cartilaginous destruction of the pinna bilaterally with increased edema throughout the left ear.  There were several areas of increased coloration around the old pinna lesions that may represent new areas of necrosis.  MOUTH:  Pharynx was moist and slightly erythematous, but with no exudates.  NECK:  Full range of motion.  No JVD.  LUNGS:  Normal respiratory effort.  Normal breath sounds.  No crackles or wheezes.  HEART:  Regular rate and rhythm, rate on exam 87/min.  No murmurs, gallops, or rubs.  ABDOMEN:  Soft and NT.  Normal bowel sounds.  No distention.  No guarding.  No rebound tenderness. Mid axillary area tenderness extending from waist to under axilla. MUSCULOSKELETAL:  No joint swelling or joint warmth.  PULSES:  2+ DP/PT pulses bilaterally.  EXTREMITIES:  No cyanosis or clubbing, however, there was a 4-5 cm nonhealing lesion on the left leg with no pus over fascial base.  The wound edges appeared to have healed down to the fascial plane with no significant bed of granulation tissue.  NEUROLOGIC:  Alert and oriented x3.  Cranial nerves II-XII intact.  SKIN:  Normal turgor, however, there was a 3-cm subcutaneous nodule on the left forearm near the elbow as well as a 1-cm nodule at the base of the 2nd finger on the right hand.  There was no significant erythema over  either lesion, however, they are both very tender.  LABS:   Sodium (NA)                              137               135-145          mEq/L  Potassium (K)                            3.7               3.5-5.1          mEq/L  Chloride                                 104               96-112           mEq/L  CO2                                      25                19-32            mEq/L  Glucose                                  102        h      70-99            mg/dL  BUN                                      9                  6-23             mg/dL  Creatinine                               0.68              0.4-1.2          mg/dL  GFR, Est African American                >60               >60              mL/min   Bilirubin, Total                         0.5               0.3-1.2          mg/dL  Alkaline Phosphatase                     63                39-117           U/L  SGOT (AST)                               27                0-37             U/L  SGPT (ALT)                               15                0-35             U/L  Total  Protein                           6.4               6.0-8.3          g/dL  Albumin-Blood                            3.0        l      3.5-5.2          g/dL  Calcium                                  8.9               8.4-10.5         mg/dL  Creatine Kinase, Total                   160               7-177            U/L   WBC 4.6  Hg 10.5  Plt 312   Amphetamins                                 NONE DETECTED  Barbiturates                                NONE DETECTED  Benzodiazepines                             NONE DETECTED  Cocaine     POSITIVE     Opiates                      POSITIVE  Tetrahydrocannabinol     POSITIVE    Color, Urine                             AMBER      a      YELLOW  Appearance                               CLOUDY     a      CLEAR  Specific Gravity  1.029             1.005-1.030  pH                                       6.0               5.0-8.0  Urine Glucose                            NEGATIVE          NEG           Ketones                                  15         a      NEG           Blood                                    NEGATIVE          NEG  Protein                                  100        a      NEG            Urobilinogen                             1.0               0.0-1.0          Nitrite                                  NEGATIVE          NEG  Leukocytes                               TRACE      a       NEG   Squamous Epithelial / LPF                MANY       a      RARE  Urine Crystals                           SEE NOTE.  a      NEG    CA OXALATE CRYSTALS  WBC / HPF                                3-6               <3                RBC / HPF  3-6               <3              Bacteria / HPF                           FEW        a      RARE  Urine-Other                              SEE NOTE.    MUCOUS PRESENT    CXR: No evidence of acute cardiopulmonary disease.   ASSESSMENT AND PLAN:  (1) Ear edema - with some necrotic areas, chronic problem for patient and pain exacerbated by cocaine use, will keep an eye on it, for now it does not appear infectious.  (2) Cocaine use - uds + and pt also admits to it, she denies any chest pain at the time but had several episodes over the past few days, will observe on tele and will cycle CE with EKG, we have discussed the issue of cessation (3) multiple subcutaneous nodules - chronic in nature, these appear as if they come and go, unclear etiology but ? cocaine related (4) DEPRESSION/ANXIETY - Continue home medications (5) VTE PROPH - lovenox

## 2010-11-04 NOTE — Initial Assessments (Signed)
INTERNAL MEDICINE ADMISSION HISTORY AND PHYSICAL  Attending: Dr. Lars Mage First contact: Dr. Silverio Decamp   [phone] Second contact: Dr. Vanessa Kick  [phone] [place], after-hours: [phone], [phone])  PCP: Dr. Shon Baton  CC: Painful skin rash of left axilla  HPI: Patient is a 50 year old female with PMH of levimasole associated vasculitis, shingles, PSA, depression and homeless who came to ED for severe pain in her left axilla. She has shingles for about 3 weeks and acyclovir was started on 07/30/2010 and she said the shingles has been spreading with worsening pain and itching. She did not take any pain medications, but reported some fever, chill and pain all over her body including plueritis pain and cough with mild hemoptysis. She had 4 ED visits in the past one week and highest Temp of 99. CTA on 08/02/2010 suspects some inflammation and azithromycin was started. She denies any other sxs, denies cocaine abuse, but admitted smoking use. She also has diarrhea with 3-4 watery diarrhea, no dysuria or melena.  ALLERGIES:  ! 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 Louanne Skye HTN - not currently requiring any medication Polysubstance abuse Depression Shingles   MEDICATIONS: FLUOXETINE HCL 20 MG CAPS (FLUOXETINE HCL) Take 1 capsule by mouth once a day for depression VITAMIN B-12 1000 MCG TABS (CYANOCOBALAMIN) Take 1 tablet by mouth once a day BENADRYL 25 MG CAPS (DIPHENHYDRAMINE HCL) 1 tab every 6 hrs as needed for itching MULTIVITAMINS  CAPS (MULTIPLE VITAMIN) Take 1 tablet by mouth once a day ACYCLOVIR 800 MG TABS (ACYCLOVIR) Take 1 tablet by mouth every 4 hours X 7 days (started 07/30/2010) TRAMADOL HCL 50 MG TABS (TRAMADOL HCL) Take 1 tablet by mouth every 6 hours as needed for pain  Azithomycin 250 mg by mouth once daily for  4 days (started 08/02/2010)  SOCIAL HISTORY: Unemployed:  cleaning in past Living at [place] prior crack/cocaine use tobacco:  1/2 ppd alcohol:  none   FAMILY HISTORY Colon cancer? in aunt breast cancer in mother   ROS: See HPI  VITALS: T:98.2  P:107  BP:155/87  R: 19 O2SAT:95%  ON:RA  PHYSICAL EXAM: General:  NAD Ears:  both ears have old necrotic lesions Mouth:  MMM, no exudates Neck:  supple and full ROM.   Lungs:  normal respiratory effort, normal breath sounds, no crackles, and no wheezes.   Heart:  normal rate, regular rhythm, and no murmur.   Abdomen:  soft and non-tender. BS positive. Msk:  no joint swelling, no joint warmth, and no redness over joints.   Extremities:  no edema and left shin a wound with dressing, no drainage. Neurologic:  alert & oriented X3.   Skin:  3-4 cm bilateral necrotic lesion of the cheeks with surrounding rim of erythema Vesicles and weeping with yellow exudates under left armpit and extend to left chest and arm.    LABS:  WBC                                      3.9        l      4.0-10.5         K/uL  RBC  3.93              3.87-5.11        MIL/uL  Hemoglobin (HGB)                         10.8       l      12.0-15.0        g/dL  Hematocrit (HCT)                         32.7       l      36.0-46.0        %  MCV                                      83.2              78.0-100.0       fL  MCH -                                    27.5              26.0-34.0        pg  MCHC                                     33.0              30.0-36.0        g/dL  RDW                                      15.6       h      11.5-15.5        %  Platelet Count (PLT)                     328               150-400          K/uL  Sodium (NA)                              137               135-145          mEq/L  Potassium (K)                            3.3        l      3.5-5.1          mEq/L  Chloride                                  104               96-112           mEq/L  CO2  25                19-32            mEq/L  Glucose                                  121        h      70-99            mg/dL  BUN                                      11                6-23             mg/dL  Creatinine                               0.61              0.4-1.2          mg/dL  GFR, Est Non African American            >60               >60              mL/min  GFR, Est African American                >60               >60              mL/min    Oversized comment, see footnote  1  Calcium                                  9.0               8.4-10.5         mg/dL  Clinical Data:  Right-sided chest pain, coughing,    CT ANGIOGRAPHY CHEST WITH CONTRAST (08/02/2010)    Technique:  Multidetector CT imaging of the chest was performed   using the standard protocol during bolus administration of   intravenous contrast.  Multiplanar CT image reconstructions   including MIPs were obtained to evaluate the vascular anatomy.    Contrast:  100 ml Omnipaque 300    Comparison:  C t chest 04/27/2010, 01/30/2010    Findings:  No filling defects within the pulmonary arteries to   suggest acute pulmonary embolism.  No acute findings aorta or great   vessels.  No pericardial fluid.    No evidence of axillary or supraclavicular lymphadenopathy.  No   mediastinal adenopathy.  There is mild hilar adenopathy.    Review of the lung windows demonstrate the central lobular   emphysema in the upper lobes.  There is ground-glass  opacity in   the right lower lobe which is new from prior.    The airways are normal.  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    Limited view of the upper abdomen is unremarkable.    Limited view of the  skeleton is unremarkable.    Review of the MIP images confirms the above findings.    IMPRESSION:    1.  No evidence of acute pulmonary embolism.   2.  Ground-glass opacity in the right lower lobe consistent with   infection or inflammation.   ASSESSMENT AND PLAN:  1. Shingles of left axilla: Will continue acyclovir and pain control with tramadol and neurontin. May have ID consult for better control of shingles spread such as IV acyclovir or other antiviral meds such as valacyclovir.  2. Chest pain with pain all over her body: Since recent CT of chest suspect pulmonar inflammation, concerned with PNA, she said her chest pain and cough has been better after taking azithromycin, so will continue azithomycin. Since she is a cocaine abuser, will check EKG and CE to r/o ACS.  3. Non-healing left shin wound: No drainge and will continues Wound care.  4. PSA: she has been cocaine abuse with positive UDS. Pt has been counseled regarding the dangers of her cocaine use, however, thus far she has been unable to abstain.   5. Depression. Continue fluoxetine and she denies SI/HI.   6. Vit B12 deficiency. Continue supplementation.   7: Diarrhea: Will check Stool C. Diff and Cx, O&P.  8. DVT Prophylaxis: lovenox

## 2010-11-04 NOTE — Miscellaneous (Signed)
Summary: [facility] CERTIF[facility]   Imported By: Enedina Finner 08/26/2010 14:17:09  _____________________________________________________________________  External Attachment:    Type:   Image     Comment:   External Document

## 2010-11-04 NOTE — Medication Information (Signed)
Summary: [facility]   Imported By: Garlan Fillers 04/04/2010 11:22:04  _____________________________________________________________________  External Attachment:    Type:   Image     Comment:   External Document

## 2010-11-04 NOTE — Miscellaneous (Signed)
INTERNAL MEDICINE ADMISSION HISTORY AND PHYSICAL Attending: Dr. Orene Desanctis First Contact: Dr. Valetta Close [phone] Second Contact: Dr. Kelton Pillar [phone] PCP: Dr. Shon Baton  CC: Skin lesions  HPI: This is a 50 year old homeless woman who has been admitted here several times for vasculitic skin lesions secondary to levimasole toxicity who presents due to a non healing left shin lesion, increased edema of the left ear and new subcutaneous nodules.  The patient was recently discharged from [facility] in improving condition on 06/02/10 and the plan at that time was that she be admitted to a 14 day addiction recovery program.  Upon discharge the patient was appropriately tapered on her prednisone, but was unable to attend the rehab facility because of her open shin wound.  The patient admits to using cocaine 2-3 days ago after discharge.  The wound on the patients shin is secondary to an I+D performed on 04/23/10.The shin wound causes significant pain 8/10 which is sharp and constant.  The wound is open with healed wound edges and drains white puss.  The patient also has a one day history of increased ear edema with increased darkness of the skin around her old lesions.   The patient describes two new subcutaneous nodules one on her left forearm and one directly above the nail bed of her second finger on the right hand.  The patient denies any fevers, chills, night sweats, or cough but does state that she had diarrhea last Friday and Saturday and has had a sore throat and increased congestion for the last few day.   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 Louanne Skye HTN - not currently requiring any medication Polysubstance abuse Depression  MEDICATIONS: SERTRALINE HCL 50 MG TABS (SERTRALINE HCL) Take 1 tablet by mouth once a day  SOCIAL  HISTORY: Unemployed:  cleaning in past Was previously living in [place] but has used up her benifit and is now homeless.  Current crack cocaine use tobacco:  6 cig/day alcohol:  none  FAMILY HISTORY Colon cancer? in aunt breast cancer in mother  ROS: Negative as per HPI  VITALS: T:98.3  P:103  BP:118/74  R: 20 O2SAT: 98% ON:RA PHYSICAL EXAM: General:  alert, well-developed, and cooperative to examination.   Head:  normocephalic and atraumatic.   Eyes:  vision grossly intact, pupils equal, pupils round, pupils reactive to light, no injection and anicteric.  Ears: Bilateral cartilaginous distruction of the penna bilatearlly with increased edema through out the left ear.  There are several areas of increased coloration around the old penna lesions that may represent new areas of nicrosis.   Mouth:  pharynx moist and slightly erythematous with no exudates.   Neck:  full ROM, no JVD. Lungs:  normal respiratory effort, no accessory muscle use, normal breath sounds, no crackles, and no wheezes.  Heart:  normal rate, regular rhythm, no murmur, no gallop, and no rub.   Abdomen:  soft, non-tender, normal bowel sounds, no distention, no guarding, no rebound tenderness, no hepatomegaly, and no splenomegaly.   Msk:  no joint swelling, no joint warmth, and no redness over joints.   Pulses:  2+ DP/PT pulses bilaterally Extremities:  No cyanosis, clubbing, edema; however, there is a 4-5 cm non healing lesion on the left leg with scan puss over a fascial  base.  The wound edges apear to have healed down to the fascial plane with no sigificant bed of granulation tissue.  Neurologic:  alert & oriented X3, cranial nerves II-XII intact, strength difficult to judge in extremities secondary to pain, sensation intact to light touch. Skin:  turgor normal there is a 3 cm subcutanious nodule on the left forearm near the elbow as well as a 1 cm nodule at the base of the second finger on the right hand.  There  is no significant erythema over either lesion however they are very tender.   Psych:  Oriented X3, memory intact for recent and remote, normally interactive, good eye contact, not anxious appearing, and not depressed appearing.  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  Non healing I+D site:  The skin appears to have healed down to the facial plane leaving an open lesion.  Surgery was contacted regarding the lesion and did not advise closure or further operation.  At this time, wound care will be consulted for further management.  Although there is scant puss at this time, there is no cellulitis around the lesion so antibiotics are not being administered at this time. The patient has previously received a 10 day course of bactrim for this lesion. Will check UDS, CBC, CMET.   Subcutaneous nodules: likely due to levimasole toxicity, will restart prednesone at 40 mg daily and taper appropriatly.  Will continue to monitor for new lesions.  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 Pts last ESR was 47 on Aug 21/11 will recheck ESR to evaluate severity of the vasculitis.  Pain Control: Appears to be secondary to her vasculitis. Will treat for now with Percoset 37m by mouth Q6PRN.  Sore throat/Congestion: Likely viral and patient denies any cough recently, however, will do chest X-ray to rule out lower respiratory infection.   Cocaine abuse: Patient has been counseled multiple times regarding the cause of her condition and is aware that continued use will likely lead to progression of her disease. Will consult social work for help  with placement in rehab facility once patient is medically clear.    DEPRESSION/ANXIETY:Continue home medications  VTE PROPH: lovenox  Disposition: Will discuss  placement with social work.  Attending Physician: I have seen and examined the patient. I reviewed the resident/fellow note and agree with the findings and plan of care as documented. My additions and revisions are included.

## 2010-11-04 NOTE — Discharge Summary (Signed)
Summary: Hospital Discharge Update    Hospital Discharge Update:  Date of Admission: 06/08/2010 Date of Discharge: 06/13/2010  Brief Summary:  This is a 50 year old woman who presents due to new subcutanious nodules as well as a non healing leg wound (sp I+D) likely due to levimasole toxisity from cocaine use.  Wound care was consulted and recomened wet to dry dressings.  The patient was placed on a prednisone taper for her new vasculitic lesions and discharged to [facility].  Pt needs to be enroled in a 14 day rehab for her cocaine use.  Labs needed at follow-up: CBC with differential  Other follow-up issues:  F/u wound healing  Medication list changes:  Removed medication of OXYCODONE HCL 10 MG TABS (OXYCODONE HCL) May take 1 tablet every four hours as needed for pain. Added new medication of PERCOCET 10-325 MG TABS (OXYCODONE-ACETAMINOPHEN) Take 1-2 tablets by mouth every 6 hours as needed for pain. - Signed Added new medication of PREDNISONE 10 MG TABS (PREDNISONE) Take 3 tabs by mouth daily for 3 days Then, Take 2 tabs by mouth daily for 4 days.  Then, Take 1 tab by mouth daily for 6 days.  Then stop. - Signed Rx of PERCOCET 10-325 MG TABS (OXYCODONE-ACETAMINOPHEN) Take 1-2 tablets by mouth every 6 hours as needed for pain.;  #20 x 0;  Signed;  Entered by: Jola Schmidt MD;  Authorized by: Jola Schmidt MD;  Method used: Print then Give to Patient Rx of PREDNISONE 10 MG TABS (PREDNISONE) Take 3 tabs by mouth daily for 3 days Then, Take 2 tabs by mouth daily for 4 days.  Then, Take 1 tab by mouth daily for 6 days.  Then stop.;  #23 x 0;  Signed;  Entered by: Jola Schmidt MD;  Authorized by: Jola Schmidt MD;  Method used: Print then Give to Patient  The medication, problem, and allergy lists have been updated.  Please see the dictated discharge summary for details.  Discharge medications:  SERTRALINE HCL 50 MG TABS (SERTRALINE HCL) Take 1 tablet by mouth once a day VITAMIN B-12  1000 MCG TABS (CYANOCOBALAMIN) Take 1 tablet by mouth once a day PERCOCET 10-325 MG TABS (OXYCODONE-ACETAMINOPHEN) Take 1-2 tablets by mouth every 6 hours as needed for pain. PREDNISONE 10 MG TABS (PREDNISONE) Take 3 tabs by mouth daily for 3 days Then, Take 2 tabs by mouth daily for 4 days.  Then, Take 1 tab by mouth daily for 6 days.  Then stop.  Other patient instructions:  Please come for an appointment at the outpatient clinic at [facility] on 9/22 at 4 :00 pm with Dr.  Shon Baton for a followup visit.  PLEASE FOLLOW UP AT THE WOUND CLINIC IN TWO WEEKS.   Please take your medication as prescribed below.  If you have any problem, Please call the clinic.   In case of an emergency  dial 911 or go to the emergency department.   Note: Hospital Discharge Medications & Other Instructions handout was printed, one copy for patient and a second copy to be placed in hospital chart.

## 2010-11-04 NOTE — Consult Note (Signed)
Summary: Consultation Report  Consultation Report   Imported By: Garlan Fillers 07/10/2010 11:27:32  _____________________________________________________________________  External Attachment:    Type:   Image     Comment:   External Document

## 2010-11-04 NOTE — Miscellaneous (Signed)
Summary: ED Discharge  Clinical Lists Changes  Patient presented to ED after being refused admission to [facility] last night because of 1cm incision on her L shin residual from a subcutaneous nodule that had previously been incised and drained. She had completed a 10day course of Bactrim and the lesion was without drainage, erythema, or other signs of infection. However, the intake nurses at [facility] considered the incinsion an open wound, which disqualified her from admission.  SW found placement for her at an ALF in [place]. However, she has refused to go because she is does not want to go that far away. She says that she would rather live out on the street. She walked down from the ED to talk to Katy Fitch in the [facility], who is inquiring about when her medicaid is expected to take effect. Ms. Grussing hope is that her medicaid will take effect soon and she may be able to return to [facility]. During her admission, SW stated that they would give her a 7day guarantee to return to [facility] but [facility] refused a guarantee of less than 30 days.   She was given one dose of 66m oxycodone in the ED. [place] and she would be able to get transportation to her clinic visits from there. However, because she has refused this placement, she will have to be responsible for her own housing. I intended to give her a small prescription (#15) for oxycodone because [facility]. However, she left the ED before I could give this prescription to her.   Medications: Added new medication of OXYCODONE HCL 10 MG TABS (OXYCODONE HCL) May take 1 tablet every four hours as needed for pain.

## 2010-11-04 NOTE — Miscellaneous (Signed)
Summary: [facility]:   [facility]:   Imported By: Bonner Puna 07/21/2010 15:36:12  _____________________________________________________________________  External Attachment:    Type:   Image     Comment:   External Document

## 2010-11-04 NOTE — Progress Notes (Signed)
Summary: Soc. Work  Dealer placed by: Social Work Summary of Call: Message left at [employer] to call Glori at [facility] and reschedule her [facility] appointment that she missed when she was in the hospital.   I also called Bryer at [facility] and asked her to call there for appmt.  Ludy called back and said she had an appmt there on August 12th.    SW will follow with Mora to ensure appmt.

## 2010-11-04 NOTE — Discharge Summary (Signed)
Summary: Hospital Discharge Update    Hospital Discharge Update:  Date of Admission: 06/25/2010 Date of Discharge: 06/30/2010  Brief Summary:  see previous updates from Dr. Valetta Close (9/23)  Other follow-up issues:  schedule an appointment with a  rheumatologist in [facility]  Medication list changes:  Added new medication of BENADRYL 25 MG CAPS (DIPHENHYDRAMINE HCL) 1 tab every 6 hrs as needed for itching - Signed Added new medication of MULTIVITAMINS  CAPS (MULTIPLE VITAMIN) Take 1 tablet by mouth once a day - Signed  The medication, problem, and allergy lists have been updated.  Please see the dictated discharge summary for details.  Discharge medications:  SERTRALINE HCL 50 MG TABS (SERTRALINE HCL) Take 1 tablet by mouth once a day VITAMIN B-12 1000 MCG TABS (CYANOCOBALAMIN) Take 1 tablet by mouth once a day OXYCODONE HCL 10 MG TABS (OXYCODONE HCL) Take 1 tab every 6 hours as neede for pain. BENADRYL 25 MG CAPS (DIPHENHYDRAMINE HCL) 1 tab every 6 hrs as needed for itching MULTIVITAMINS  CAPS (MULTIPLE VITAMIN) Take 1 tablet by mouth once a day  Other patient instructions:  [facility] is on 07/09/10 at 4 pm Stay away from cocaine, that is the cause of all your problems.   Please take your medication as prescribed below.  If you have any problem, Please call the clinic.   In case of an emergency  dial 911 or go to the emergency department.'    Note: Hospital Discharge Medications & Other Instructions handout was printed, one copy for patient and a second copy to be placed in hospital chart.

## 2010-11-04 NOTE — Initial Assessments (Signed)
INTERNAL MEDICINE ADMISSION HISTORY AND PHYSICAL  Attending: Dr. Lynnae January First contact: Dr. Shon Baton [phone] Second contact: Dr. 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 She has continued to have vasculitic skin lesions. During hospital course, pt completed 7-day course of Doxycycline, and was sent home on slow prednisone taper, which pt states she has been taking regularly and as prescribed. She even notes that after discharge, there was eventual regression of her skin nodules. However, since yesterday, pt has again developed multiple painful skin nodules on BL LE, and LUE with associated 10/10, unrelenting pain. The constant pain prompted pt to present to ED today. At this point, she denies any nausea/vomiting, decreased UOP, changes in her bowel movements, fever, headache, cough, or abdominal pain. Denies recent cocaine or alcohol usage.  ALLERGIES: Tylenol: "patient swells up"    PAST MEDICAL HISTORY: 1. 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 Louanne Skye 2. HTN 3. Polysubstance abuse 4. Depression  5. Extremities ulcer with MSSA abscess of left leg (01/2010) and right axilla (11/2009) 6. Neutropenia 7. Homelessness   MEDICATIONS: CARVEDILOL 12.5 MG TABS (CARVEDILOL) Take 1 tablet by mouth two times a day SERTRALINE HCL 50 MG TABS (SERTRALINE HCL) Take 1 tablet by mouth once a day OXYCODONE HCL 10 MG TABS (OXYCODONE HCL) take 1 tablet by mouth q4 hours as needed for  pain PREDNISONE 10 MG TABS (PREDNISONE) Take 4 tabs by mouth daily for 4 days, then take 3 tabs by mouth daily for 7 days, then take 2 tabs by mouth daily for 7 days, then take 1 tab by mouth daily for 7 days   SOCIAL HISTORY: Unemployed:  cleaning in past Living at [place] Prior crack/cocaine use tobacco:  1/2 ppd alcohol:  none   FAMILY HISTORY Unknown  ROS: Negative as per HPI  VITALS: T: 98.4  BP:123/75  P: 82100 R:17  O2SAT: 98-100% ON:RA  PHYSICAL EXAM:  General:  alert, slightly malnourished, and cooperative to examination.   Head:  atraumatic  Eyes:  vision grossly intact, pupils equal, pupils round, pupils reactive to light, no injection and anicteric.   Mouth:  pharynx pink and moist, no erythema, and no exudates.   Neck:  supple, full ROM, no thyromegaly, no JVD, and no carotid bruits.   Lungs:  normal respiratory effort, no accessory muscle use, normal breath sounds, no crackles, and no wheezes.  Heart:  normal rate, regular rhythm, no murmur, no gallop, and no rub.   Abdomen:  soft, non-tender, normal bowel sounds, no distention, no guarding, no rebound tenderness, no hepatomegaly, and no splenomegaly.   Msk:  no joint swelling, no joint warmth, and no redness over joints.   Pulses:  2+ DP/PT pulses bilaterally Extremities:  No cyanosis, clubbing, edema  Neurologic:  alert & oriented X3, cranial nerves II-XII intact, strength normal in all extremities, sensation intact to light touch, and gait normal.   Skin:  Diffuse scaring on the lower extremities due to prior  levimasole toxisity. Degenerated cartilage in the pena of ears bilaterally. Multiple hard, indurated erythematous nodules on anterior LLE (1) 6cm x 2.5cm  (2) 2cm x 2cm on posterior LLE. RLE with 2cm x 2cm indurated nodule, LUE with 3 1cm blisters.  Psych:  Oriented X3,  normally interactive, good eye contact, tearful from pain throughout exam.   LABS: CBC WBC                                       7.0               4.0-10.5         K/uL  RBC                                      3.90              3.87-5.11        MIL/uL  Hemoglobin (HGB)                         11.1       l      12.0-15.0        g/dL  Hematocrit (HCT)                         32.9       l      36.0-46.0        %  MCV                                      84.2              78.0-100.0       fL  MCH -                                    28.4              26.0-34.0        pg  MCHC                                     33.7              30.0-36.0        g/dL  RDW                                      17.0       h      11.5-15.5        %  Platelet Count (PLT)                     512        h      150-400          K/uL  Neutrophils, %  75                43-77            %  Lymphocytes, %                           18                12-46            %  Monocytes, %                             7                 3-12             %  Eosinophils, %                           0                 0-5              %  Basophils, %                             1                 0-1              %  Neutrophils, Absolute                    5.2               1.7-7.7          K/uL  Lymphocytes, Absolute                    1.2               0.7-4.0          K/uL  Monocytes, Absolute                      0.5               0.1-1.0          K/uL  Eosinophils, Absolute                    0.0               0.0-0.7          K/uL  Basophils, Absolute                      0.1               0.0-0.1          K/uL  BMET Sodium (NA)                              134        l      135-145          mEq/L  Potassium (K)  3.6               3.5-5.1          mEq/L  Chloride                                 98                96-112           mEq/L  CO2                                      26                19-32            mEq/L  Glucose                                  122        h      70-99            mg/dL  BUN                                       13                6-23             mg/dL  Creatinine                               0.60              0.4-1.2          mg/dL  GFR, Est Non African American            >60               >60              mL/min  GFR, Est African American                >60               >60              mL/min    Oversized comment, see footnote  1  Calcium                                  9.2               8.4-10.5         mg/dL  UA  Color, Urine                             AMBER      a      YELLOW    BIOCHEMICALS MAY BE AFFECTED BY COLOR  Appearance                               CLOUDY  a      CLEAR  Specific Gravity                         1.025             1.005-1.030  pH                                       6.5               5.0-8.0  Urine Glucose                            NEGATIVE          NEG              mg/dL  Bilirubin                                SMALL      a      NEG  Ketones                                  NEGATIVE          NEG              mg/dL  Blood                                    NEGATIVE          NEG  Protein                                  NEGATIVE          NEG              mg/dL  Urobilinogen                             2.0        h      0.0-1.0          mg/dL  Nitrite                                  NEGATIVE          NEG  Leukocytes                               TRACE      a      NEG  UDS Amphetamins                              SEE NOTE.         NDT    NONE DETECTED  Barbiturates                             SEE NOTE.  NDT    Oversized comment, see footnote  1  Benzodiazepines                          POSITIVE   a      NDT  Cocaine                                  POSITIVE   a      NDT  Opiates                                  POSITIVE   a      NDT  Tetrahydrocannabinol                     SEE NOTE.         NDT    NONE DETECTED   ASSESSMENT AND PLAN: This is a 50 year old female with a history of relapsing polychondritis who  presents with multiple, painful subcutaneous nodules in the setting of recent cocaine use.   1.Vasculitis with persistent skin ulceration and subcutaneous skin nodules - In the setting of a continued history of cocaine use and documented levimasole toxicity it is felt that the patient's symptoms are the result of continued toxicity. Pt still with cocaine + UDS. Will obtain wound cultures and gram stain from LUE blister, UDS. Will continue treatment with Prednisone and empirically treat with Vancomycin. Pain control via Morphine 4-50m q4hours as needed.  2. Anemia: Stable at this time. HGB is improved from last admission.   3. Cocaine Abuse: Pt has been counseled numerous times regarding the importance of abstaining from drugs of abuse, however UDS on multiple recent admissions are still positive for cocaine.   4.Chronic pain syndrome: At present time, due to increased pain, will start Morphine 4-669mq4hours. Can consider continuing percocet 5/325 1-2 by mouth Q6H  as needed for pain on discharge.   5. HTN: Continue home medications  6. DEPRESSION/ANXIETY: Continue home medications at prescribed dosage.   7. VTE PROPH: Lovenox   Attending Attachment: I have seen and examined the patient. I reviewed the resident/fellow note and agree with the findings and plan of care as documented. My additions and revisions are included.    Name:  Date:

## 2010-11-04 NOTE — Progress Notes (Signed)
Summary: Follow up appointments discussed during office visit  Phone Note Outgoing Call   Call placed by: Lucky Rathke NT II,  March 10, 2010 12:16 PM Call placed to: Patient Details for Reason: APPT WITH DR Deno Etienne Summary of Call: CALLED [facility] - [phone] / SPOKE WITH BLONDIE , FOLLOW UP APPT [place]. DONNA T. WILL BE FOLLOWING UP ON THE MENTAL HEALTH FOLLOW UP APPT. APPT WITH DR Eden 13, 011 @ 9:45 PM. [place]. Harold Mattes NT II  March 10, 2010 12:18 PM

## 2010-11-04 NOTE — Discharge Summary (Signed)
Summary: Hospital Discharge Update    Hospital Discharge Update:  Date of Admission: 05/25/2010 Date of Discharge: 06/02/2010  Brief Summary:  Patient was admitted for flare of lovamisole-associated vasculitis with increased pain and swelling in ears following cocaine use on Aug 17th.  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 Treated pain with oxycodone. Because pain, swelling, and occassional minimal bleeding of ears, it was decided to treat her with a short prednisone taper, although the utility of corticosteroid therapy in levamisole-associated vasculitis is not established. The plan is for her to continue to be followed in the [facility] and she will see [facility] in November for further recommendations/management of this condition. 2. Oral ulcer. During admission, she complained of a painful, punch-out ulcer on the roof of her mouth. She was treated with a 5 day course of acyclovir. Viral cx pending. 3. L shin nodule. Nodule had minimal drainage on admission. She had been started on Bactrim in the clinic on Aug 18th. We continued the Bactrim and she had completed the 10 day course at the time of discharge. Incision was one cm in diameter without erythema or drainage. 4. Cocaine abuse. She was discharged to [facility] in [place] where she will enter 14day treatment course.  Medication list changes:  Removed medication of OXYCODONE HCL 10 MG TABS (OXYCODONE HCL) take 1 tablet by mouth q4 hours as needed for pain Removed medication of CARVEDILOL 12.5 MG TABS (CARVEDILOL) Take 1 tablet by mouth two times a day Removed medication of SULFAMETHOXAZOLE-TMP DS 800-160 MG TABS (SULFAMETHOXAZOLE-TRIMETHOPRIM) Take 1 tablet by mouth twice daily for 10 days Added new medication of PREDNISONE 10 MG TABS (PREDNISONE) Take 1 tablet by mouth once a day - Signed Added new medication of ACYCLOVIR 200 MG CAPS (ACYCLOVIR) Take 1 tablet by mouth three times a day -  Signed Rx of PREDNISONE 10 MG TABS (PREDNISONE) Take 1 tablet by mouth once a day;  #2 x 0;  Signed;  Entered by: Epimenio Sarin MD;  Authorized by: Epimenio Sarin MD;  Method used: Print then Give to Patient Rx of ACYCLOVIR 200 MG CAPS (ACYCLOVIR) Take 1 tablet by mouth three times a day;  #6 x 0;  Signed;  Entered by: Epimenio Sarin MD;  Authorized by: Epimenio Sarin MD;  Method used: Print then Give to Patient  The medication, problem, and allergy lists have been updated.  Please see the dictated discharge summary for details.  Discharge medications:  SERTRALINE HCL 50 MG TABS (SERTRALINE HCL) Take 1 tablet by mouth once a day VITAMIN B-12 1000 MCG TABS (CYANOCOBALAMIN) Take 1 tablet by mouth once a day PREDNISONE 10 MG TABS (PREDNISONE) Take 1 tablet by mouth once a day ACYCLOVIR 200 MG CAPS (ACYCLOVIR) Take 1 tablet by mouth three times a day  Other patient instructions:  Please follow-up with Dr. Shon Baton at the [facility] on Thursday September 22nd at 4:00pm.  Please follow-up with Dr. Meda Coffee at [facility] Rheumat[facility] on Friday November 18th at 2:00pm.   If you experience weakness, bleeding, increased pain, or fever, please call [phone] or go to the Emergency Department.   Note: Hospital Discharge Medications & Other Instructions handout was printed, one copy for patient and a second copy to be placed in hospital chart.  Prescriptions: ACYCLOVIR 200 MG CAPS (ACYCLOVIR) Take 1 tablet by mouth three times a day  #6 x 0   Entered and Authorized by:   Epimenio Sarin MD   Signed by:   Olegario Shearer  Harlow Mares MD on 06/02/2010   Method used:   Print then Give to Patient   RxID:   [id number] PREDNISONE 10 MG TABS (PREDNISONE) Take 1 tablet by mouth once a day  #2 x 0   Entered and Authorized by:   Epimenio Sarin MD   Signed by:   Epimenio Sarin MD on 06/02/2010   Method used:   Print then Give to Patient   RxID:   [id number]

## 2010-11-04 NOTE — Discharge Summary (Signed)
Summary: Hospital Discharge Update    Hospital Discharge Update:  Date of Admission: 07/06/2010 Date of Discharge: 07/25/2010  Brief Summary:  Patient is a 50 year old female who presented to the [facility] ED complaining of pain in her ears and legs.  She has a history of levamisole associated vasculitis, crack cocaine use, depression, and homelessness.  On presentation she had bilateral cheek necrosis and worsening ear edema and necrosis.  She was admitted for pain control and wound care.  She progressively improved and the question of disposition was the most recent issue.  It was recommended for her to be placed at a residential treatment facility to help with her addiction but she refused to be placed.  She is being discharged today to [facility] with hope of getting her placed at the [facility] or another shelter in the [place] area.  Other follow-up issues:  She will probably need adjustment of her Prozac in the future as well as the question of pain control.   Medication list changes:  Changed medication from SERTRALINE HCL 50 MG TABS (SERTRALINE HCL) Take 1 tablet by mouth once a day to FLUOXETINE HCL 20 MG CAPS (FLUOXETINE HCL) Take 1 capsule by mouth once a day for depression - Signed Removed medication of OXYCODONE HCL 10 MG TABS (OXYCODONE HCL) Take 1 tab every 6 hours as neede for pain. Rx of FLUOXETINE HCL 20 MG CAPS (FLUOXETINE HCL) Take 1 capsule by mouth once a day for depression;  #90 x 1;  Signed;  Entered by: Trish Fountain MD;  Authorized by: Trish Fountain MD;  Method used: Electronically to [facility] [phone]*, [address], [place], [place]  [address], Ph: [phone], Fax: [phone]  The medication, problem, and allergy lists have been updated.  Please see the dictated discharge summary for details.  Discharge medications:  FLUOXETINE HCL 20 MG CAPS (FLUOXETINE HCL) Take 1 capsule by mouth once a day for depression VITAMIN B-12 1000 MCG TABS (CYANOCOBALAMIN)  Take 1 tablet by mouth once a day BENADRYL 25 MG CAPS (DIPHENHYDRAMINE HCL) 1 tab every 6 hrs as needed for itching MULTIVITAMINS  CAPS (MULTIPLE VITAMIN) Take 1 tablet by mouth once a day  Other patient instructions:  You have a follow up appointment with Dr. Wendee Beavers at the [facility] on 07/30/2010 at 11:00 am.  Your Prozac prescription was sent to [facility] on [address] (off [address]) for a 90 day supply.  Please Continue taking your Prozac.  It will help your mood!  You NEED to work very hard at not taking COCAINE/CRACK.  It will continue to cause pain and problems for you!  You Can Do IT!  Keep your appointment with [facility] Rheumatology in November.  If you have questions or concerns please call the Internal medicine Clinic at [phone]  Note: Hospital Discharge Medications & Other Instructions handout was printed, one copy for patient and a second copy to be placed in hospital chart.   Appended Document: Hospital Discharge Update At Ms. Smouse's follow up appointment if possible please consult Katy Fitch for assistance with getting a bus ticket for Ms. Gad to get to her Rheumatology appointment at [facility] in November.

## 2010-11-04 NOTE — Assessment & Plan Note (Signed)
Summary: HFU/DR. YANG/DR. WATSON ON SERVICE/DS   Vital Signs:  Patient profile:   50 year old female Height:      61.5 inches (156.21 cm) Weight:      99.9 pounds (45.41 kg) BMI:     18.64 Temp:     97.8 degrees F (36.56 degrees C) oral Pulse rate:   84 / minute BP sitting:   95 / 58  (right arm)  Vitals Entered By: Hilda Blades Ditzler RN (May 08, 2010 3:20 PM) Is Patient Diabetic? No Pain Assessment Patient in pain? yes     Location: left knee Intensity: 4-5 Type: aching Onset of pain  long time Nutritional Status BMI of < 19 = underweight Nutritional Status Detail appetite good  Have you ever been in a relationship where you felt threatened, hurt or afraid?denies   Does patient need assistance? Functional Status Self care Ambulation Normal Comments HFU - wants inc size portion of food at [facility]. Left message at Dr Ardyth Gal office - pt has not heard from office about FU appt per Dr Shon Baton.   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 She looks much better today - she has gained weight and is in high spirits, she states she is very "proud" of herself and her progress.   During the hospitalization, biopsies were done of bilateral shin nodules; these wounds are packed and dressed today (changed by wound care at the patient's ALF).  Per the wound care nurse (per the patient), the left shin wound is draining too much and not healing quickly enough and may require further excision.  Patient complains today of left knee swelling for the past 3 days.  She has had pain with passive and active flexion, some warmth at the joint and swelling that was at first quite noticable but that has now improved and is only minimal.  It is tender to the touch anteriorly and laterally.  She denies  any fevers.  She says that though the left shin wound is draining more than the righ shin wound, it does not look like pus.  The patient is scheduled to follow-up with surgery regarding the shin wounds in 4 days.  Patient has a history of crack cocaine abuse.  States she has not used since leaving the hospital and would like to quit for good.  She expressed interest in pursuing a drug treatment program locally.    Patient does complain of some dizziness and lightheadedness; she continues to take her blood pressure medication since discharge.  She has taken her steroid taper as instructed for her vasculitis.  She has had excellent healing of her wounds and no new nodules.    Patient has a h/o depression and says she is tolerating the zoloft well and would like to follow-up with mental health.  No fevers, CP, SOB, N/V, abdominal pain, constipation, diarrhea, dysuria, headaches, vision changes.  Depression History:      The patient denies a depressed mood most of the day and a diminished interest in her usual daily activities.         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 Take 1 Tablet By Mouth Once A Day 2)  Oxycodone Hcl 10 Mg Tabs (Oxycodone Hcl) .... Take 1 Tablet By Mouth Q4 Hours As Needed For Pain 3)  Prednisone 10  Mg Tabs (Prednisone) .... Take 3 Tabs By Mouth Daily For 6 Days, Then Take 2 Tabs By Mouth Daily For 7 Days, Then Take 1 Tab By Mouth Daily For 7 Days 4)  Vitamin B-12 1000 Mcg Tabs (Cyanocobalamin) .... Take 1 Tablet By Mouth Once A Day  Allergies: 1)  ! Tylenol  Past History:  Family History: Last updated: 05/08/2010 Colon cancer? 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 Louanne Skye HTN - not currently requiring any medication Polysubstance abuse Depression  Past Surgical History: 7/11 - biopsies of bilateral shin nodules  Family History: Colon cancer? in aunt breast cancer in mother  Social History: Reviewed history from 03/06/2010 and no changes required. Unemployed:  cleaning in past Living at [place] prior crack/cocaine use tobacco:  1/2 ppd alcohol:  none  Review of Systems       per HPI  Physical Exam  General:  alert, well-hydrated, and appropriate dress.  Patient is thin, though less so than when she was in the hospital. Head:  atraumatic.   Eyes:  pupils equal, pupils round, and pupils reactive to light.   Ears:  Ears without scabs or crusting; cartilage is missing in some spots 2/2 vasculitis, but the wounds appear to be well-healed. Nose:  no external deformity, no external erythema, and no nasal discharge.   Mouth:  pharynx pink and moist.   Neck:  supple, full ROM, and no masses.   Lungs:  normal respiratory effort, no accessory muscle use, normal breath sounds, no crackles, and no wheezes.   Heart:   normal rate, regular rhythm, and no murmur.   Abdomen:  soft and non-tender.  thin. Msk:  Left knee is ttp medially and anteriorly.  Pain with active and passive flexion. Not warm to the touch.  Not red.  minimal fluid present as compared to right knee.  Right knee wnl. Pulses:  2+ bilateral pedal pulses Neurologic:  alert & oriented X3, sensation intact to light touch, and gait normal.   Skin:  Patient with healed wounds on upper left arm, abdomen, left hip, bilateral feet, and ears.  No active vasculitic lesions noted.  Patient has bilateral packed shin wounds that are dressed with clean dressing.  No edema. Cervical Nodes:  no anterior cervical adenopathy.   Psych:  Oriented X3, memory intact for recent  and remote, normally interactive, good eye contact, not anxious appearing, and not depressed appearing.     Impression & Recommendations:  Problem # 1:  KNEE PAIN, LEFT, ACUTE (ICD-719.46) She presents with a 3 day history of left knee swelling and pain; we are concerned for possible septic joint, however the patinet states she has not had any fevers, the joint is not warm, and the swelling has decreased.  She does have a nearby open, draining wound, however that wound does not appear to be infected.  She is to follow-up with surgery in 4 days; at that time, they may choose to do an arthrocentesis of that knee.  We instructed the patient to go immediately to the ED if she develops worsening pain or swelling of the knee or develops joint warmth or fevers.   Problem # 2:  SUBSTANCE ABUSE, MULTIPLE (ICD-305.90) Patient has h/o cocaine abuse, most recently used before her last hospital admission.  She denies having used since discharge from the hospital. She would liketo go through a detox program, so we have referred her to William Dalton for coordination of this.    Orders: Social Work Referral (Social )  Problem # 3:  VASCULITIS (SWN-462.6) Secondary to levimasole toxicity.  Patient is much  improved today with no active nodules or lesions.  She is on a steroid taper, currently at 59m for the next few days then 11mx7 days and then stopped.  I will see her back in 2 weeks to continue to monitor this.  Her pain is well controlled with oxycodone 10; she takes one in the AM and one in the PM with good relief.  Problem # 4:  DEPRESSION, SEVERE (ICD-311) Patient is tolerating and responding well to current zoloft dose.  Will continue this.  She is interested in following up with [facility]ores[facility] updated medication list for this problem includes:    Sertraline Hcl 50 Mg Tabs (Sertraline hcl) ...Marland Kitchen. Take 1 tablet by mouth once a day  Problem # 5:  HOMELESS PERSON (ICD-V60.0) Patient is currently living at [facility] She is pursuing disability application; we made referral with Do[facility]ch Pennyo that this can be confirmed and followed and her options for financial assistance maximized.  Orders: Social Work Referral (Social )  Problem # 6:  ESSENTIAL HYPERTENSION (ICD-401.9) Patient with symptoms of hypotension and low-normal BPs today.  Also, patient is addicted to cocaine, so beta blocker not good choice for antihypertensive for this patient.  Will d/c coreg today.  The following medications were removed from the medication list:    Carvedilol 12.5 Mg Tabs (Carvedilol) ...Marland Kitchen. Take 1 tablet by mouth two times a day  Problem # 7:  Preventive Health Care (ICD-V70.0) AT this time we will defer preventive health services such as mammogram, pap smear, and further blood work for now.  Will consider starting management of this at next visit in 2 weeks.  Complete Medication List: 1)  Sertraline Hcl 50 Mg Tabs (Sertraline hcl) .... Take 1 tablet by mouth once a day 2)  Oxycodone Hcl 10 Mg Tabs (Oxycodone hcl) .... Take 1 tablet by mouth q4 hours as needed for pain 3)  Prednisone 10 Mg Tabs (Prednisone) .... Take 2 tabs by mouth daily for 4 days, then take 1 tab by mouth daily for 7 days, then  stop 4)  Vitamin B-12 1000 Mcg Tabs (Cyanocobalamin) .... Take 1 tablet by mouth once a day  Patient Instructions: 1)  Stop taking your  carvedilol blood pressure medication. 2)  I have referred you to Butch Penny so that you can talk about entering the rehab program and also about your financial situation. 3)  If you do not hear back about your mental health appointment, please contact the clinic so we can set one up with Dr. Molinda Bailiff. 4)  If your knee becomes worse, please go to the E.D. 5)  Please keep your follow-up appointments with surgery and podiatry. 6)  Please follow-up with me in 2 weeks.  {if (DOCUMENT.[phone] == "Yes") then CFMT(" Prevention & Chronic Care", "B,2","","") else ""   endif + " " + IF NOT (DOCUMENT.[phone] <> ""  ) THEN "" ELSE ("") ENDIF + IF NOT (DOCUMENT.N_[id number]_02 == ""  ) THEN "" ELSE ("") ENDIF + if ([phone] == "Yes") then CFMT("Immunizations", "B,1","","") else ""   endif + " " + IF NOT (TRUE) THEN "" ELSE (IF NOT (TRUE) THEN "" ELSE (if (DOCUMENT.[phone] == "Yes") then CFMT(LASTTEST2("FLU VAX"), "", "  Influenza vaccine: ", "B", " ") else ""   endif) ENDIF + IF NOT (ShowDue("FLU VAX","FLUVAXDUE",[id number],600,183,1320,[id number] )== "noshowdue"  AND match(DOCUMENT.N_[id number],1,"Give Flu vaccine today")==0 ) THEN "" ELSE ("") ENDIF + IF NOT (match(DOCUMENT.N_[id number],1,"Give Flu vaccine today")>0) THEN "" ELSE ("") ENDIF + IF NOT (ShowDue("FLU VAX","FLUVAXDUE",[id number],  0,630,1601,[id number] ) == "showdue"  AND match(DOCUMENT.N_[id number],1,"Give Flu vaccine today")==0 ) THEN "" ELSE ("") ENDIF + if ([phone] == "Yes" AND LASTOBSVALUE("FLUVAXDECLN") <> "") then CFMT(LASTTEST2("FLUVAXDECLN"), "", "  Influenza vaccine deferral: ", "B", " ") else ""   endif + IF NOT  (not(DOCUMENT.[phone] <> "" and [phone] =="")  ) THEN "" ELSE ("") ENDIF + IF NOT (DOCUMENT.[phone] <> "" and [phone] ==""  ) THEN "" ELSE ("") ENDIF + if ([phone] == "Yes") then CFMT([id number], "", "  Influenza vaccine due: ", "B", " ") else ""   endif) ENDIF + IF NOT (PATIENT._AGEINMONTHS > "216" ) THEN "" ELSE (IF NOT (TRUE) THEN "" ELSE (if (DOCUMENT.[phone] == "Yes") then CFMT(LASTTEST("TD BOOSTER"), "", "   Tetanus booster: ", "B", " ") else ""   endif) ENDIF + IF NOT (ShowDue("TD BOOSTER","TDBOOSTDUE",[id number],216,3650,1320,[id number]) == "noshowdue"  AND match(DOCUMENT.N_[id number],1,"Give tetanus booster today")==0  ) THEN "" ELSE ("") ENDIF + IF NOT (match(DOCUMENT.N_2682_[id number],1,"Give tetanus booster today")>0) THEN "" ELSE ("") ENDIF + IF NOT (ShowDue("TD BOOSTER","TDBOOSTDUE",[id number],216,3650,1320,[id number]) == "showdue"  AND match(DOCUMENT.N_2682_[id number],1,"Give tetanus booster today")==0  ) THEN "" ELSE ("") ENDIF + if ([phone] == "Yes" AND LASTOBSVALUE("TDDECLINED") <> "") then CFMT(LASTTEST2("TDDECLINED"), "", "  Td booster deferral: ", "B", " ") else ""   endif + IF NOT (not([phone] <> "" and [phone] =="")  ) THEN "" ELSE ("") ENDIF + IF NOT ([phone] <> "" and [phone] ==""  ) THEN "" ELSE ("") ENDIF + if ([phone] == "Yes") then CFMT([id number], "", "  Tetanus booster due: ", "B", " ") else ""   endif) ENDIF + IF NOT (TRUE) THEN "" ELSE (IF NOT (TRUE) THEN "" ELSE (if ([phone] == "Yes") then CFMT(LASTTEST2("PNEUMOVAX"), "", "   Pneumococcal vaccine: ", "B", " ") else ""   endif) ENDIF + IF NOT ((    ShowDue("PNEUMOVAX","PNEUMVXNEXT",[id number],780,3650,1320,[id number] ) == "noshowdue"  AND  not(LASTOBSVALUE("PNEUMOVAX") <> "" AND PATIENT._AGEINMONTHS >= 780 AND  DURATIONDAYS(str(LASTOBSDATE("PNEUMOVAX")),ADDDATES(str(PATIENT.DATEOFBIRTH),"65","0","0"))>0  AND (DURATIONDAYS(str(LASTOBSDATE("PNEUMOVAX")),str(._TODAYSDATE)) >= 1825)) ) AND match(DOCUMENT.N_[id number],1,"Give Pneumovax today")==0 ) THEN "" ELSE ("") ENDIF + IF NOT (match(DOCUMENT.N_2682_[id number],1,"Give Pneumovax today")>0) THEN "" ELSE ("") ENDIF + IF NOT ((ShowDue("PNEUMOVAX","PNEUMVXNEXT",[id number],780,3650,1320,[id number] ) == "showdue"  OR  (ShowDue("PNEUMOVAX","PNEUMVXNEXT",[id number], 780,3650,1320,DOCUMENT.PNEUMO_PRIOR )  <> "noshow"  AND  LASTOBSVALUE("PNEUMOVAX") <> "" AND  PATIENT._AGEINMONTHS >= 780 AND  DURATIONDAYS(str(LASTOBSDATE("PNEUMOVAX")),ADDDATES(str(PATIENT.DATEOFBIRTH),"65","0","0"))>0  AND (DURATIONDAYS(str(LASTOBSDATE("PNEUMOVAX")),str(._TODAYSDATE)) >= 1825))) AND match(DOCUMENT.N_2682_[id number],1,"Give Pneumovax today")==0 ) THEN "" ELSE ("") ENDIF + if ([phone] == "Yes" AND LASTOBSVALUE("PNEUVAXDECLN") <> "") then CFMT(LASTTEST2("PNEUVAXDECLN"), "", "  Pneumococcal vaccine deferral: ", "B", " ") else ""   endif + IF NOT (not (DOCUMENT.[phone] <> "" and [phone] =="")  ) THEN "" ELSE ("") ENDIF + IF NOT (DOCUMENT.[phone] <> "" and [phone] ==""  ) THEN "" ELSE ("") ENDIF + if ([phone] == "Yes") then CFMT([id number], "", "  Pneumococcal vaccine due: ", "B", " ") else ""   endif) ENDIF + IF NOT (PATIENT._AGEINMONTHS >= "720" ) THEN "" ELSE (IF NOT (TRUE) THEN "" ELSE (if (DOCUMENT.[phone] == "Yes") then CFMT(LASTTEST("ZOSTAVAX"), "", "   H. zoster vaccine: ", "B", " ") else ""   endif) ENDIF + IF NOT  (ShowDue("ZOSTAVAX","HZOSTERNEXT",[id number],720,18250,1320,[id number] ) == "noshowdue"  AND match(DOCUMENT.N_2682_[id number],1,"Give Herpes zoster vaccine today")==0 ) THEN "" ELSE ("") ENDIF + IF NOT (match(DOCUMENT.N_2682_[id number],1,"Give Herpes zoster vaccine today")>0) THEN "" ELSE ("") ENDIF + IF NOT (ShowDue("ZOSTAVAX","HZOSTERNEXT",[id number],720,18250,1320,[id number] ) == "showdue"  AND match(DOCUMENT.N_2682_[id number],1,"Give Herpes zoster vaccine today")==0 ) THEN "" ELSE ("") ENDIF + if ([phone] == "Yes" AND LASTOBSVALUE("ZOSTREFUSED") <> "") then CFMT(LASTTEST2("ZOSTREFUSED"), "", "  H. zoster vaccine deferral: ", "B", " ") else ""   endif) ENDIF + if (DOCUMENT.I_2682_[id number]_300== "Yes") then CFMT(DOCUMENT.925-076-0585, "", "   Immunization comments: ", "B", " ") else ""   endif + IF NOT (PATIENT._AGEINMONTHS=>"600") THEN "" ELSE (if (DOCUMENT.[phone] == "Yes") then FMT(" Colorectal Screening", "B,1") else ""   endif + " ") ENDIF + IF NOT (PATIENT._AGEINMONTHS=>"600") THEN "" ELSE (IF NOT (TRUE) THEN "" ELSE (if (DOCUMENT.[phone] == "Yes") then CFMT(LASTTEST2("HEMOCCULT"), "", "  Hemoccult: ", "B", " ") else ""   endif) ENDIF + IF NOT (ShowDue("HEMOCCULT","HEMOCULTDUE",[id number],600,365,1320,[id number] )  == "noshowdue"  AND match(DOCUMENT.NEWORDERS,1,"82270")==0 AND match(DOCUMENT.NEWORDERS,1,"Hemoccult Cards")==0  ) THEN "" ELSE ("") ENDIF + IF NOT (ShowDue("HEMOCCULT","HEMOCULTDUE",[id number],600,365,1320,[id number] ) == "showdue"  AND match(DOCUMENT.NEWORDERS,1,"82270")==0 AND match(DOCUMENT.NEWORDERS,1,"Hemoccult Cards")==0 ) THEN "" ELSE ("") ENDIF + IF NOT (match(DOCUMENT.NEWORDERS,1,"82270")>0 OR match(DOCUMENT.NEWORDERS,1,"Hemoccult Cards")>0) THEN "" ELSE ("") ENDIF + if (DOCUMENT.[phone] == "Yes"  AND LASTOBSVALUE("HEMOCCRECACT") <> "") then CFMT(LASTTEST2("HEMOCCRECACT"), "", "  Hemoccult action/deferral: ", "B", " ") else ""   endif + IF NOT (not(DOCUMENT.[phone] <> "" and [phone] =="" ) ) THEN "" ELSE ("") ENDIF + IF NOT (DOCUMENT.[phone] <> "" and [phone] ==""  ) THEN "" ELSE ("") ENDIF + if (DOCUMENT.I_2682_[id number]_316== "Yes") then CFMT([id number], "", "  Hemoccult due: ", "B", " ") else ""   endif) ENDIF + IF NOT (PATIENT._AGEINMONTHS=>"600") THEN "" ELSE (IF NOT (TRUE) THEN "" ELSE (if (DOCUMENT.[phone] == "Yes") then CFMT(LASTTEST2("COLONOSCOPY"), "", "   Colonoscopy: ", "B", " ") else ""   endif) ENDIF + IF NOT (ShowDue("COLONOSCOPY","COLONNXTDUE",[id number],600,3650,1320,[id number]) == "noshowdue"  AND match(DOCUMENT.NEWORDERS,1,"GI")==0  AND match(DOCUMENT.NEWORDERS,1,"Colon")==0 ) THEN "" ELSE ("") ENDIF + IF NOT (ShowDue("COLONOSCOPY","COLONNXTDUE",[id number],600,3650,1320,[id number]) == "showdue"  AND match(DOCUMENT.NEWORDERS,1,"GI")==0  AND match(DOCUMENT.NEWORDERS,1,"Colon")==0 ) THEN "" ELSE ("") ENDIF + IF NOT (match(DOCUMENT.NEWORDERS,1,"GI")>0) THEN "" ELSE ("") ENDIF + IF NOT (match(ORDERS_NEW("comma"),1,"Colon")>0) THEN "" ELSE ("") ENDIF + if (DOCUMENT.[phone] == "Yes" AND LASTOBSVALUE("COLONRECACT") <> "") then CFMT(LASTTEST2("COLONRECACT"), "", "  Colonoscopy action/deferral: ", "B", " ") else ""   endif + IF NOT (not(DOCUMENT.[phone] <> "" and [phone] =="" ) ) THEN "" ELSE ("") ENDIF + IF NOT (DOCUMENT.[phone] <> "" and [phone] ==""  ) THEN "" ELSE ("") ENDIF + if (DOCUMENT.I_2682_[id number]_316== "Yes") then CFMT([id number], "", "  Colonoscopy due: ", "B", " ") else ""   endif) ENDIF +  IF NOT (TRUE) THEN "" ELSE (if (DOCUMENT.[phone] == "Yes")  then FMT(" Other Screening", "B,1") else ""   endif + " ") ENDIF + IF NOT (PATIENT.SEX=="F" ) THEN "" ELSE (IF NOT (TRUE) THEN "" ELSE (if (DOCUMENT.[phone] == "Yes") then CFMT(LASTTEST2("PAP SMEAR"), "", "  Pap smear: ", "B", " ") else ""   endif) ENDIF + IF NOT (ShowDue("PAP SMEAR","PAP [phone]) == "noshowdue"  AND match(DOCUMENT.NEWORDERS,1,"88142")==0  AND match(DOCUMENT.NEWORDERS,1,"Q0091")==0  AND match(DOCUMENT.NEWORDERS,1,"Gyn")==0 AND match(DOCUMENT.NEWORDERS,1,"88175-97[address]")==0 ) THEN "" ELSE ("") ENDIF + IF NOT (ShowDue("PAP SMEAR","PAP [phone]) == "showdue"  AND match(DOCUMENT.NEWORDERS,1,"88142")==0  AND match(DOCUMENT.NEWORDERS,1,"Q0091")==0  AND match(DOCUMENT.NEWORDERS,1,"Gyn")==0 AND match(DOCUMENT.NEWORDERS,1,"88175-97000")==0 ) THEN "" ELSE ("") ENDIF + IF NOT (( match(DOCUMENT.NEWORDERS,1,"88142")>0  OR match(DOCUMENT.NEWORDERS,1,"88175-97000")>0 OR match(DOCUMENT.NEWORDERS,1,"Q0091")>0  ) AND match(DOCUMENT.NEWORDERS,1,"Gyn")==0) THEN "" ELSE ("") ENDIF + IF NOT (match(DOCUMENT.NEWORDERS,1,"Gyn")>0) THEN "" ELSE ("") ENDIF + if ([phone] == "Yes" AND LASTOBSVALUE("PAPRECACT") <> "") then CFMT(LASTTEST2("PAPRECACT"), "", "  Pap smear action/deferral: ", "B", " ") else ""   endif + IF NOT (not(DOCUMENT.[phone] <> "" and [phone] =="" ) ) THEN "" ELSE ("") ENDIF + IF NOT ([phone] <> "" and [phone] ==""  ) THEN "" ELSE ("") ENDIF + if (DOCUMENT.I_2682_[id number]_347== "Yes") then CFMT([id number], "", "  Pap smear due: ", "B", " ") else ""   endif) ENDIF + IF NOT (PATIENT.SEX=="M" AND    (   PATIENT._AGEINMONTHS=>"600"   OR (PATIENT._AGEINMONTHS=>"540" AND (PATIENT.RACE=="B"   OR match(DOCUMENT.PROBSAFTER,"ICD-V16.42")>0))   ) ) THEN "" ELSE (IF NOT  (TRUE) THEN "" ELSE (if (DOCUMENT.[phone] == "Yes") then CFMT(LASTTEST2("PSA"), "", "  PSA: ", "B", " ") else ""   endif) ENDIF + IF NOT (ShowDue("PSA","PSADUE",[id number],600,365,1320,[id number] ) <> "noshow"  AND match(DOCUMENT.NEWORDERS,1,"[address]")==0  AND match(DOCUMENT.NEWORDERS,1,"84153-PSA")==0  AND match(DOCUMENT.NEWORDERS,1,"84153-23780")==0 AND [id number]=="Discussed-PSA requested" ) THEN "" ELSE ("") ENDIF + IF NOT (ShowDue("PSA","PSADUE",[id number],600,365,1320,[id number] ) == "showdue"  AND match(DOCUMENT.NEWORDERS,1,"[address]")==0  AND match(DOCUMENT.NEWORDERS,1,"84153-PSA")==0  AND match(DOCUMENT.NEWORDERS,1,"84153-23780")==0 AND [id number]<>"Discussed-PSA requested"  AND LASTOBSVALUE("PSARECACT") <>  "Discussed-PSA declined" AND LASTOBSVALUE("PSARECACT") <>  "Not indicated" ) THEN "" ELSE ("") ENDIF + IF NOT (ShowDue("PSA","PSADUE",[id number],600,365,1320,[id number] ) == "noshowdue"  AND match(DOCUMENT.NEWORDERS,1,"[address]")==0  AND match(DOCUMENT.NEWORDERS,1,"84153-PSA")==0  AND match(DOCUMENT.NEWORDERS,1,"84153-23780")==0 AND [id number]<>"Discussed-PSA requested"  AND LASTOBSVALUE("PSARECACT") <>  "Discussed-PSA declined" ) THEN "" ELSE ("") ENDIF + IF NOT (match(DOCUMENT.NEWORDERS,1,"[address]")>0  OR match(DOCUMENT.NEWORDERS,1,"84153-PSA")>0  OR match(DOCUMENT.NEWORDERS,1,"84153-23780")>0  ) THEN "" ELSE (if (DOCUMENT.[phone] == "Yes") then FMT("  PSA ordered.", "B") else ""   endif + " ") ENDIF + if (DOCUMENT.[phone] == "Yes" AND LASTOBSVALUE("PSARECACT") <> "") then CFMT(LASTTEST2("PSARECACT"), "", "  PSA action/deferral: ", "B", " ") else ""   endif + IF NOT (not(DOCUMENT.U_0233_[id number]_729 <> "" and [phone] =="")  ) THEN "" ELSE ("") ENDIF + IF NOT (DOCUMENT.A_[id number] <> "" and  [phone] ==""  ) THEN "" ELSE ("") ENDIF + if (DOCUMENT.I_[id number]_347== "Yes") then CFMT([id number], "", "  PSA due due: ", "B", " ") else ""   endif) ENDIF + IF NOT (PATIENT.SEX=="F" AND PATIENT._AGEINMONTHS=>"480" ) THEN "" ELSE (IF NOT (TRUE) THEN "" ELSE (if (DOCUMENT.[phone] == "Yes") then CFMT(LASTTEST2("MAMMOGRAM"), "", "   Mammogram: ", "B", " ") else ""   endif) ENDIF + IF NOT (ShowDue("MAMMOGRAM","MAMMO PHK",[id number]_34,037,096,4383,[id number])  == "noshowdue" AND match(DOCUMENT.NEWORDERS,1,"Mammo")==0 ) THEN "" ELSE ("") ENDIF + IF NOT (ShowDue("MAMMOGRAM","MAMMO EKB",[id number],446,950,7225,[id number]) == "showdue" AND match(DOCUMENT.NEWORDERS,1,"Mammo")==0 ) THEN "" ELSE ("") ENDIF + IF NOT (match(DOCUMENT.NEWORDERS,1,"Mammo")>0) THEN "" ELSE ("") ENDIF + if (DOCUMENT.[phone] == "Yes" AND LASTOBSVALUE("MAMMRECACT") <> "") then CFMT(LASTTEST2("MAMMRECACT"), "", "  Mammogram action/deferral: ", "B", " ") else ""   endif + IF NOT (not(DOCUMENT.[phone] <> "" and [phone] =="" ) ) THEN "" ELSE ("")  ENDIF + IF NOT ([phone] <> "" and [phone] ==""  ) THEN "" ELSE ("") ENDIF + if (DOCUMENT.[id number]== "Yes") then CFMT([id number], "", "  Mammogram due: ", "B", " ") else ""   endif) ENDIF + IF NOT (PATIENT.SEX=="F" AND PATIENT._AGEINMONTHS>="89" ) THEN "" ELSE (IF NOT (TRUE) THEN "" ELSE (if ([phone] == "Yes") then CFMT(LASTTEST2("BONE DENSITY"), "", "   DXA bone density scan: ", "B", " ") else ""   endif) ENDIF + IF NOT (ShowDue("BONE DENSITY","DEXANXTDUE",[id number],780,730,1320,[id number]) == "noshowdue"  AND match(DOCUMENT.NEWORDERS,1,"Dexa scan")==0) THEN "" ELSE ("") ENDIF + IF NOT (ShowDue("BONE  DENSITY","DEXANXTDUE",[id number],780,730,1320,[id number]) == "showdue"  AND match(DOCUMENT.NEWORDERS,1,"Dexa scan")==0) THEN "" ELSE ("") ENDIF + IF NOT (match(DOCUMENT.NEWORDERS,1,"Dexa scan")>0) THEN "" ELSE ("") ENDIF + if (DOCUMENT.[phone] == "Yes" AND LASTOBSVALUE("DEXARECACT") <> "") then CFMT(LASTTEST2("DEXARECACT"), "", "  DXA bone density action/deferral: ", "B", " ") else ""   endif + IF NOT (not([phone] <> "" and [phone] =="" ) ) THEN "" ELSE ("") ENDIF + IF NOT (DOCUMENT.[phone] <> "" and [phone] ==""  ) THEN "" ELSE ("") ENDIF + if (DOCUMENT.[id number]== "Yes") then CFMT([id number], "", "  DXA scan due: ", "B", "  ") else ""   endif) ENDIF + if match(DOCUMENT.N_[id number],1,"Request")>0 then FMT(" Reports requested:", "B,1") else ""   endif + " " + IF NOT (match(DOCUMENT.E_[id number],9,"XJOITGP report of last colonoscopy")>0  ) THEN "" ELSE (if (DOCUMENT.[phone] == "Yes") then FMT("  Last colonoscopy report requested.", "B") else ""   endif + " ") ENDIF + IF NOT (match(DOCUMENT.S_[id number],2,"KMQKMMN report of last colonoscopy")==0  ) THEN "" ELSE ("") ENDIF + IF NOT (PATIENT.SEX=="F"  AND match(DOCUMENT.O_[id number],8,"VANVBTY report of last Pap")==0  ) THEN "" ELSE ("") ENDIF + IF NOT (PATIENT.SEX=="F"  AND match(DOCUMENT.O_[id number],9,"RVUYEBX report of last Pap")>0  ) THEN "" ELSE (if (DOCUMENT.[phone] == "Yes") then FMT("  Last Pap report requested.", "B") else ""   endif + " ") ENDIF + IF NOT (PATIENT.SEX=="F"  AND match(DOCUMENT.B_[id number],9,"PNPYYFR report of last mammogram")==0  ) THEN "" ELSE ("") ENDIF + IF NOT (PATIENT.SEX=="F"  AND match(DOCUMENT.T_[id number],3,"UDTHYHO report of last mammogram")>0  ) THEN "" ELSE (if (DOCUMENT.[phone] == "Yes")  then FMT("  Last mammogram report requested.", "B") else ""   endif + " ") ENDIF + IF NOT (PATIENT.SEX=="F"  AND match(DOCUMENT.P_[id number],7,"MBBUYZJ report of last DXA")==0  ) THEN "" ELSE ("") ENDIF + IF NOT (PATIENT.SEX=="F"  AND match(DOCUMENT.Q_[id number],3,"KGOVPCH report of last DXA")>0  ) THEN "" ELSE (if (DOCUMENT.[phone] == "Yes") then FMT("  Last DXA report requested.", "B") else ""   endif + " ") ENDIF + if (DOCUMENT.[phone] == "Yes") then CFMT(LASTTEST2("SMOK STATUS"), "", " Smoking status: ", "B,1", " ") else ""   endif + IF NOT (LASTOBSVALUE("SMOK STATUS")=="current" OR LASTOBSVALUE("SMOK STATUS")=="Current"  AND [id number]=="") THEN "" ELSE ("") ENDIF + IF NOT ([id number]=="") THEN "" ELSE ("") ENDIF + IF NOT (LASTOBSVALUE("SMOK STATUS")=="current" OR LASTOBSVALUE("SMOK STATUS")=="Current") THEN "" ELSE (if (DOCUMENT.[phone] == "Yes" AND LASTOBSVALUE("SMOK ADVICE") <> "") then CFMT(LASTTEST2("SMOK ADVICE"), "", "  Smoking cessation counseling: ", "B", " ") else ""   endif) ENDIF + IF NOT (LASTOBSVALUE("SMOK STATUS")=="current" OR LASTOBSVALUE("SMOK STATUS")=="Current") THEN "" ELSE (if (DOCUMENT.[phone] == "Yes" AND LASTOBSVALUE("SMKCSTGQTDT") <> "") then CFMT(LASTTEST2("SMKCSTGQTDT"), "", "  Target quit date: ", "B", " ") else ""   endif) ENDIF + if (DOCUMENT.[id number]== "Yes" OR DOCUMENT.I_[id number]_316== "Yes"  OR DOCUMENT.INCLUDE__PSA== "Yes")  then CFMT(DOCUMENT.[phone], "", "   Screening comments: ", "B", " ") else ""   endif + IF NOT (match(DOCUMENT.PROBSAFTER,"ICD-250")>0) THEN "" ELSE (if (DOCUMENT.[phone] == "Yes") then FMT(" Diabetes  Mellitus", "B,1") else ""   endif + " ") ENDIF + IF NOT (match(DOCUMENT.PROBSAFTER,"ICD-250")>0) THEN "" ELSE (IF NOT (TRUE) THEN "" ELSE (if ([phone] == "Yes") then  CFMT(LASTTEST2("HGBA1C"), "", "  HgbA1C: ", "B", " ") else ""   endif) ENDIF + IF NOT (ShowDue("HGBA1C","HGBA1CNXTDUE",[id number],1,90,1320,[id number]) == "noshowdue"   AND match(DOCUMENT.NEWORDERS,1,"83036QW")==0 AND match(DOCUMENT.NEWORDERS,1,"83036")==0 AND match(DOCUMENT.NEWORDERS,1,"83036-A1C")==0 ) THEN "" ELSE ("") ENDIF + IF NOT (ShowDue("HGBA1C","HGBA1CNXTDUE",[id number],1,90,1320,[id number]) == "showdue"   AND match(DOCUMENT.NEWORDERS,1,"83036QW")==0 AND match(DOCUMENT.NEWORDERS,1,"83036")==0 AND match(DOCUMENT.NEWORDERS,1,"83036-A1C")==0 ) THEN "" ELSE ("") ENDIF + IF NOT (match(DOCUMENT.NEWORDERS,1,"83036QW")>0 OR match(DOCUMENT.NEWORDERS,1,"83036")>0 OR match(DOCUMENT.NEWORDERS,1,"83036-A1C")>0) THEN "" ELSE ("") ENDIF + IF NOT (match(DOCUMENT.NEWORDERS,1,"Glucose, (CBG)")==0  AND match(DOCUMENT.NEWORDERS,1,"Glucose Cap-FMC")==0  AND match(DOCUMENT.NEWORDERS,1,"T- Capillary Blood Glucose")==0 AND match(DOCUMENT.NEWORDERS,1,"T-Capillary Blood Glucose")==0) THEN "" ELSE ("") ENDIF + IF NOT (match(DOCUMENT.NEWORDERS,1,"Glucose, (CBG)")>0  OR match(DOCUMENT.NEWORDERS,1,"Glucose Cap-FMC")>0  OR match(DOCUMENT.NEWORDERS,1,"T- Capillary Blood Glucose")>0 OR match(DOCUMENT.NEWORDERS,1,"T-Capillary Blood Glucose")>0) THEN "" ELSE ("") ENDIF + if (DOCUMENT.[phone] == "Yes" AND LASTOBSVALUE("HGBA1CCACT") <> "") then CFMT(LASTTEST2("HGBA1CCACT"), "", "  HgbA1C action/deferral: ", "B", " ") else ""   endif + IF NOT (not(DOCUMENT.[phone] <> "" and [phone] == "")  ) THEN "" ELSE ("") ENDIF + IF NOT (DOCUMENT.[phone] <> "" and [phone] == ""  ) THEN "" ELSE ("") ENDIF + if (DOCUMENT.I_[id number]_410== "Yes") then CFMT([id number], "", "  Hemoglobin A1C due: ", "B", " ") else ""   endif) ENDIF + IF NOT (match(DOCUMENT.PROBSAFTER,"ICD-250")>0) THEN "" ELSE (IF NOT (TRUE)  THEN "" ELSE (if ([phone] == "Yes") then CFMT(LASTTEST2("DIAB EYE EX"), "", "   Eye exam: ", "B", " ") else ""   endif) ENDIF + IF NOT (match(DOCUMENT.H_[id number],1,"OFBPZWC report of last diabetic eye exam")==0  ) THEN "" ELSE ("") ENDIF + IF NOT (match(DOCUMENT.H_[id number],4,"RXVQMGQ report of last diabetic eye exam")>0  ) THEN "" ELSE (if (DOCUMENT.[phone] == "Yes") then FMT("  Last eye exam report requested.", "B") else ""   endif + " ") ENDIF + IF NOT (match(DOCUMENT.NEWORDERS,1,"Ophthalmology")>0) THEN "" ELSE ("") ENDIF + IF NOT (ShowDue("DIAB EYE EX","DMEYEEXAMNXT",[id number],1,365,1320,[id number]) == "noshowdue"  AND match(DOCUMENT.NEWORDERS,1,"Ophthalmology")==0) THEN "" ELSE ("") ENDIF + IF NOT (ShowDue("DIAB EYE EX","DMEYEEXAMNXT",[id number],1,365,1320,[id number]) == "showdue"  AND match(DOCUMENT.NEWORDERS,1,"Ophthalmology")==0) THEN "" ELSE ("") ENDIF + if (DOCUMENT.[phone] == "Yes" AND LASTOBSVALUE("DMEYERECACT") <> "") then CFMT(LASTTEST2("DMEYERECACT"), "", "  Diabetic eye exam action/deferral: ", "B", " ") else ""   endif + IF NOT (not(DOCUMENT.[phone] <> "" and [phone] == "" ) ) THEN "" ELSE ("") ENDIF + IF NOT (DOCUMENT.[phone] <> "" and [phone] == ""  ) THEN "" ELSE ("") ENDIF + if (DOCUMENT.I_2682_[id number]_410== "Yes") then CFMT([id number], "", "  Eye exam due: ", "B", " ") else ""   endif) ENDIF + IF NOT (match(DOCUMENT.PROBSAFTER,"ICD-250")>0) THEN "" ELSE (IF NOT (TRUE) THEN "" ELSE (if ([phone] == "Yes") then CFMT(LASTTEST2("DIAB FOOT CK"), "", "   Foot exam: ", "B", " ") else ""   endif) ENDIF + IF NOT (not ( ShowDue("DIAB FOOT CK","DB FT EX [phone]) == "showdue"   OR (LASTOBSVALUE("HIGHRISKFT") == "Yes"   AND ShowDue("DIAB FOOT CK","DB FT EX [phone]) == "showdue") ) ) THEN "" ELSE ("") ENDIF + IF NOT (ShowDue("DIAB FOOT CK","DB FT EX [phone]) == "showdue"   OR (LASTOBSVALUE("HIGHRISKFT") == "Yes"  AND ShowDue("DIAB FOOT CK","DB FT EX QBV",[id number],9,15,0569,[id number]) == "showdue") ) THEN "" ELSE ("") ENDIF + if (DOCUMENT.[phone] == "Yes" AND LASTOBSVALUE("DIABFTRECACT")<>"") then CFMT(LASTOBSVALUE("DIABFTRECACT"), "", "  Foot exam action/deferral: ", "B", " ") else ""   endif + IF NOT (TRUE) THEN "" ELSE (if (DOCUMENT.[phone] == "Yes") then CFMT(LASTTEST2("HIGHRISKFT"), "", "  High risk foot: ", "B", " ") else ""  endif) ENDIF + IF NOT (DOCUMENT.LOCUSER <> "[facility]" AND ( not ( ShowDue("DIAB FOOT CK","DB FT EX [phone]) == "showdue"   OR (LASTOBSVALUE("HIGHRISKFT") == "Yes"  AND ShowDue("DIAB FOOT CK","DB FT EX [phone]) == "showdue") ) ) ) THEN "" ELSE ("") ENDIF + IF NOT (DOCUMENT.LOCUSER <> "[facility]" AND ( ShowDue("DIAB FOOT CK","DB FT EX [phone]) == "showdue"   OR (LASTOBSVALUE("HIGHRISKFT") == "Yes"  AND ShowDue("DIAB FOOT CK","DB FT EX [phone]) == "showdue") ) ) THEN "" ELSE ("") ENDIF + IF NOT ((LASTOBSVALUE("DIAB FOOT CK") == "" AND LASTOBSVALUE("DBT FT EX DT")<>"")   OR  (   LASTOBSVALUE("DIAB FOOT CK") <> "" AND LASTOBSVALUE("DBT FT EX DT")<>"" AND   DURATIONDAYS(str(LASTOBSDATE("DIAB FOOT CK")),str(._TODAYSDATE)) >   DURATIONDAYS(str(LASTOBSVALUE("DBT FT EX DT")),str(._TODAYSDATE)) ) ) THEN "" ELSE ("") ENDIF + IF NOT (TRUE) THEN "" ELSE (if (DOCUMENT.[phone] == "Yes")  then CFMT(LASTTEST2("QD FTINSPECT"), "", "  Foot care education: ", "B", " ") else ""   endif) ENDIF + IF NOT ([phone] <> "" and [phone] ==""  ) THEN "" ELSE ("") ENDIF + IF NOT (not(   [phone] <> "" and [phone] =="" ) ) THEN "" ELSE ("") ENDIF + if (DOCUMENT.I_[id number]_410== "Yes") then CFMT([id number], "", "  Foot exam due: ", "B", " ") else ""   endif) ENDIF + IF NOT (match(DOCUMENT.PROBSAFTER,"ICD-250")>0) THEN "" ELSE (IF NOT (TRUE) THEN "" ELSE (if ([phone] == "Yes") then CFMT(LASTTEST2("MICROALB/CRE"), "", "   Urine microalbumin/creatinine ratio: ", "B", " ") else ""   endif) ENDIF + IF NOT (((USER.CURLOCATIONNAME == "[facility]" AND  ShowDue("MICROALB URN","MICRALB [phone]) == "noshowdue" ) OR  (USER.CURLOCATIONNAME <> "[facility]" AND  ShowDue("MICROALB/CRE","MICRALB [phone]) == "noshowdue" )) AND match(DOCUMENT.NEWORDERS,1,"82043 / [address]")==0  AND match(DOCUMENT.NEWORDERS,1,"82044")==0 AND match(DOCUMENT.NEWORDERS,1,"82043-MALB")==0 ) THEN "" ELSE ("") ENDIF + IF NOT (((USER.CURLOCATIONNAME == "[facility]" AND  ShowDue("MICROALB URN","MICRALB [phone]) == "showdue" ) OR  (USER.CURLOCATIONNAME <> "[facility]" AND  ShowDue("MICROALB/CRE","MICRALB [phone]) == "showdue" )) AND match(DOCUMENT.NEWORDERS,1,"82043 / [address]")==0  AND match(DOCUMENT.NEWORDERS,1,"82044")==0 AND match(DOCUMENT.NEWORDERS,1,"82043-MALB")==0 ) THEN "" ELSE ("") ENDIF + IF NOT (match(DOCUMENT.NEWORDERS,1,"82043 / [address]")>0  OR match(DOCUMENT.NEWORDERS,1,"82044")>0  OR match(DOCUMENT.NEWORDERS,1,"82043-MALB")>0) THEN "" ELSE  ("") ENDIF + if (DOCUMENT.[phone] == "Yes" AND LASTOBSVALUE("MICALBRECACT")<>"") then CFMT(LASTOBSVALUE("MICALBRECACT"), "", "  Urine microalbumin action/deferral: ", "B", " ") else ""   endif + IF NOT (not(DOCUMENT.[phone] <> "" and [phone] =="")  ) THEN "" ELSE ("") ENDIF + IF NOT (DOCUMENT.7436682289 <> "" and [phone] ==""  ) THEN "" ELSE ("") ENDIF + if (DOCUMENT.I_2682_[id number]_410== "Yes") then CFMT([id number], "", "  Urine microalbumin/cr due: ", "B", " ") else ""   endif) ENDIF + IF NOT (match(DOCUMENT.PROBSAFTER,"ICD-250")>0) THEN "" ELSE (IF (STR([id number], [phone]) == "") THEN "" ELSE (if (DOCUMENT.6166001247 == "Yes") then CFMT([id number], "", "   Diabetes flowsheet reviewed?: ", "B", " ") else ""   endif) ENDIF + if (DOCUMENT.[phone] == "Yes") then CFMT([id number], "", "  Progress toward A1C goal: ", "B", " ") else ""   endif + if (DOCUMENT.[phone] == "Yes") then CFMT([id number], "", "   Stage of readiness to change (diabetes management): ", "B", " ") else ""   endif + if (DOCUMENT.[phone] == "Yes") then CFMT([phone], "", "  Diabetes comments: ", "B", " ") else ""   endif) ENDIF + IF NOT (match(DOCUMENT.PROBSAFTER,"ICD-272")>0   OR PATIENT._AGEINMONTHS=>"89") THEN "" ELSE (if (DOCUMENT.[phone] == "Yes") then FMT(" Lipids", "B,1") else ""   endif + " " + if (DOCUMENT.[phone] == "Yes") then CFMT(LASTTEST2("CHOLESTEROL"), "", "  Total Cholesterol: ", "B", " ") else ""   endif + IF NOT (( ShowDue("CHOLESTEROL","LIPIDS [phone] ) == "showdue"  OR   (match(PROB_AFTER(),"ICD-250")>0  AND  ShowDue("CHOLESTEROL","LIPIDS [phone] ) ==  "showdue"  )  OR  (match(PROB_AFTER(),"ICD-272")>0  AND  ShowDue("CHOLESTEROL","LIPIDS [phone] ) == "showdue"  ) )  AND ( match(DOCUMENT.NEWORDERS,1,"80061-22930")==0  AND match(DOCUMENT.NEWORDERS,1,"80061-LIPID")==0 )  ) THEN "" ELSE ("") ENDIF + IF NOT (not ( ( ShowDue("CHOLESTEROL","LIPIDS [phone] ) == "showdue"  OR   (match(PROB_AFTER(),"ICD-250")>0  AND  ShowDue("CHOLESTEROL","LIPIDS [phone] ) == "showdue"  )  OR  (match(PROB_AFTER(),"ICD-272")>0  AND  ShowDue("CHOLESTEROL","LIPIDS [phone] ) == "showdue"  ) ) ) AND ( match(DOCUMENT.NEWORDERS,1,"80061-22930")==0  AND match(DOCUMENT.NEWORDERS,1,"80061-LIPID")==0 ) ) THEN "" ELSE ("") ENDIF + IF NOT (match(DOCUMENT.NEWORDERS,1,"80061-22930")>0  OR match(DOCUMENT.NEWORDERS,1,"80061-LIPID")>0) THEN "" ELSE ("") ENDIF + if ([phone] == "Yes" AND LASTOBSVALUE("CHOLESTRECAC")<>"") then CFMT(LASTOBSVALUE("CHOLESTRECAC"), "", "  Lipid panel action/deferral: ", "B", " ") else ""   endif + if (DOCUMENT.[phone] == "Yes") then CFMT(LASTTEST2("LDL"), "", "  LDL: ", "B", " ") else ""   endif + if (DOCUMENT.5058375765 == "Yes") then CFMT(LASTTEST2("LDL DIR"), "", "  LDL Direct: ", "B", " ") else ""   endif + IF NOT (match(DOCUMENT.NEWORDERS,1,"83721-81033")>0  OR match(DOCUMENT.NEWORDERS,1,"83721-DIRLDL")>0) THEN "" ELSE ("") ENDIF + IF NOT (match(DOCUMENT.NEWORDERS,1,"83721-81033")==0  AND match(DOCUMENT.NEWORDERS,1,"83721-DIRLDL")==0) THEN "" ELSE ("") ENDIF + if (DOCUMENT.[phone] == "Yes") then CFMT(LASTTEST2("HDL"), "", "  HDL: ", "B", " ") else ""   endif + if (DOCUMENT.[phone] == "Yes") then CFMT(LASTTEST2("TRIGLYC TOT"), "", "   Triglycerides: ", "B", " ") else ""   endif + IF NOT (not(DOCUMENT.[phone] <> "" and [phone] =="")  ) THEN "" ELSE ("") ENDIF + IF NOT (DOCUMENT.[phone] <> "" and [phone] ==""  ) THEN "" ELSE ("") ENDIF + if (DOCUMENT.I_2682_[id number]_176== "Yes") then CFMT([id number], "", "  Lipid panel due: ", "B", " ") else ""   endif) ENDIF + IF NOT (match(DOCUMENT.PROBSAFTER,"ICD-272")>0) THEN "" ELSE (if ([phone] == "Yes") then CFMT(LASTTEST2("SGOT (AST)"), "", "   SGOT (AST): ", "B", " ") else ""   endif + IF NOT (ShowDue("SGOT (AST)","LIVFUNTDUE",[id number],216,365,1320,[id number] ) <> "showdue"  AND  ( match(DOCUMENT.NEWORDERS,1,"80076-22960")==0  AND match(DOCUMENT.NEWORDERS,1,"80076-HEPATIC")==0 AND match(DOCUMENT.NEWORDERS,1,"80053-22900")==0 AND match(DOCUMENT.NEWORDERS,1,"80053-COMP")==0 ) ) THEN "" ELSE ("") ENDIF + IF NOT (match(DOCUMENT.NEWORDERS,1,"80076-22960")>0  OR match(DOCUMENT.NEWORDERS,1,"80076-HEPATIC")>0) THEN "" ELSE ("") ENDIF + IF NOT (ShowDue("SGOT (AST)","LIVFUNTDUE",[id number],216,365,1320,[id number] ) == "showdue"  AND ( match(DOCUMENT.NEWORDERS,1,"80076-22960")==0  AND match(DOCUMENT.NEWORDERS,1,"80076-HEPATIC")==0 AND match(DOCUMENT.NEWORDERS,1,"80053-22900")==0 AND match(DOCUMENT.NEWORDERS,1,"80053-COMP")==0 )  ) THEN "" ELSE ("") ENDIF + if (DOCUMENT.[phone] == "Yes" AND LASTOBSVALUE("LIVPNLRECACT")<>"") then CFMT(LASTOBSVALUE("LIVPNLRECACT"), "", "  BMP action: ", "B", " ") else ""   endif + if ([phone] == "Yes") then CFMT(LASTTEST2("SGPT (ALT)"), "", "  SGPT (ALT): ", "B", " ") else ""   endif + IF NOT (match(DOCUMENT.NEWORDERS,1,"80053-22900")==0 AND match(DOCUMENT.NEWORDERS,1,"80053-COMP")==0) THEN "" ELSE ("") ENDIF + IF NOT (match(DOCUMENT.NEWORDERS,1,"80053-22900")>0 OR  match(DOCUMENT.NEWORDERS,1,"80053-COMP")>0) THEN "" ELSE ("CMP ordered  ") ENDIF + if (DOCUMENT.[phone] == "Yes") then CFMT(LASTTEST2("ALK PHOS"), "", "  Alkaline phosphatase: ", "B", " ") else ""   endif + if (DOCUMENT.[phone] == "Yes") then CFMT(LASTTEST2("BILI TOTAL"), "", "  Total bilirubin: ", "B", " ") else ""   endif + IF NOT (not (   [phone] <> "" and [phone] ==""   ) ) THEN "" ELSE ("") ENDIF + IF NOT (DOCUMENT.[phone] <> "" and [phone] ==""  ) THEN "" ELSE ("") ENDIF + if (DOCUMENT.I_[id number]_176== "Yes") then CFMT([id number], "", "  Liver panel due: ", "B", " ") else ""   endif) ENDIF + IF NOT (match(DOCUMENT.PROBSAFTER,"ICD-272")>0) THEN "" ELSE (if (DOCUMENT.[phone] == "Yes")  then CFMT([id number], "", "   Lipid flowsheet reviewed?: ", "B", " ") else ""   endif + if (DOCUMENT.[phone] == "Yes") then CFMT([id number], "", "  Progress toward LDL goal: ", "B", " ") else ""   endif + if ([phone] == "Yes") then CFMT([id number], "", "   Stage of readiness to change (lipid management): ", "B", " ") else ""   endif + if ([phone] == "Yes") then CFMT([phone], "", "  Lipid comments: ", "B", " ") else ""   endif) ENDIF + IF NOT (match(DOCUMENT.PROBSAFTER,"ICD-401")>0 OR match(DOCUMENT.PROBSAFTER,"ICD-405")>0 ) THEN "" ELSE (if ([phone] == "Yes") then FMT(" Hypertension", "B,1") else ""   endif + " " + IF (TRUE) THEN "" ELSE ("") ENDIF + IF NOT (TRUE) THEN "" ELSE (cond case [phone] == "Yes" and LASTOBSVALUE("BP SYSTOLIC") == "" CFMT(("Not Documented"), "", "  Last Blood Pressure: ", "B", " ") case [phone] == "Yes" and LASTOBSVALUE("BP SYSTOLIC") <> "" CFMT((LASTOBSVALUE("BP  SYSTOLIC") + " / " + LASTOBSVALUE("BP DIASTOLIC") + "  (" + LASTOBSDATE("BP SYSTOLIC")+ ")"), "", "  Last Blood Pressure: ", "B", " ") else "" endcond) ENDIF + IF NOT (TRUE) THEN "" ELSE (if (DOCUMENT.[phone] == "Yes") then CFMT(LASTTEST2("CREATININE"), "", "  Serum creatinine: ", "B", " ") else ""   endif) ENDIF + IF NOT (ShowDue("CREATININE","BMP [phone]) == "noshowdue"   AND ( match(DOCUMENT.NEWORDERS,1,"80048-22910")==0  AND match(DOCUMENT.NEWORDERS,1,"80048-METABOL")==0 AND match(DOCUMENT.NEWORDERS,1,"80053-22900")==0 AND match(DOCUMENT.NEWORDERS,1,"80053-COMP")==0 ) ) THEN "" ELSE ("") ENDIF + IF NOT (ShowDue("CREATININE","BMP [phone]) == "showdue"   AND  ( match(DOCUMENT.NEWORDERS,1,"80048-22910")==0  AND match(DOCUMENT.NEWORDERS,1,"80048-METABOL")==0 AND match(DOCUMENT.NEWORDERS,1,"80053-22900")==0 AND match(DOCUMENT.NEWORDERS,1,"80053-COMP")==0 ) ) THEN "" ELSE ("") ENDIF + IF NOT (match(DOCUMENT.NEWORDERS,1,"80048-22910")>0  OR match(DOCUMENT.NEWORDERS,1,"80048-METABOL")>0) THEN "" ELSE ("") ENDIF + if ([phone] == "Yes" AND LASTOBSVALUE("BMPRECACT")<>"") then CFMT(LASTOBSVALUE("BMPRECACT"), "", "  BMP action: ", "B", " ") else ""   endif + IF NOT (TRUE) THEN "" ELSE (if (DOCUMENT.3043444111 == "Yes") then CFMT(LASTTEST2("K SERUM"), "", "  Serum potassium ", "B", " ") else ""   endif) ENDIF + IF NOT (match(DOCUMENT.NEWORDERS,1,"80053-22900")>0 OR match(DOCUMENT.NEWORDERS,1,"80053-COMP")>0) THEN "" ELSE ("CMP ordered  ") ENDIF + IF NOT (DOCUMENT.[phone] <> "" and [phone] ==""  ) THEN "" ELSE ("") ENDIF + IF NOT (not(   DOCUMENT.5743517897 <> "" and [phone] =="" ) ) THEN "" ELSE ("") ENDIF + if (DOCUMENT.I_[id number]_332== "Yes") then CFMT([id number], "", "  Basic  metabolic panel due: ", "B", " ") else ""   endif + IF (STR([id number], [phone]) == "") THEN "" ELSE (if (DOCUMENT.[phone] == "Yes") then CFMT([id number], "", "   Hypertension flowsheet reviewed?: ", "B", " ") else ""   endif) ENDIF + if ([phone] == "Yes") then CFMT([id number], "", "  Progress toward BP goal: ", "B", " ") else ""   endif + if (DOCUMENT.[phone] == "Yes") then CFMT([id number], "", "   Stage of readiness to change (hypertension management): ", "B", " ") else ""   endif + if (DOCUMENT.[phone] == "Yes") then CFMT(DOCUMENT.4348500403, "", "  Hypertension comments: ", "B", " ") else ""   endif) ENDIF + IF NOT (match(DOCUMENT.PROBSAFTER,"ICD-272")>0     OR match(DOCUMENT.PROBSAFTER,"ICD-250")>0    OR match(DOCUMENT.PROBSAFTER,"ICD-401")>0   OR match(DOCUMENT.PROBSAFTER,"ICD-405")>0                                  ) THEN "" ELSE (if ([phone] == "Yes") then FMT(" Self-Management Support :", "B,1") else ""   endif + " " + IF (STR(OBSANY("PERSA1CGOAL"), OBSANY("PERSBPGOAL"), OBSANY("PERSLDLGOAL")) == "") THEN "" ELSE (if (  DOCUMENT.[phone] == "Yes") then FMT("  Personal Goals (by the next clinic visit) :", "B") else ""   endif + " " + IF NOT (match(DOCUMENT.PROBSAFTER,"ICD-250")>0) THEN "" ELSE (if (DOCUMENT.[phone] == "Yes" AND LASTOBSVALUE("PERSA1CGOAL") <> "") then CFMT(LASTTEST2("PERSA1CGOAL"), "", "    Personal A1C goal: ", "B", "") else ""   endif) ENDIF + IF NOT (match(DOCUMENT.PROBSAFTER,"ICD-250")>0  OR match(DOCUMENT.PROBSAFTER,"ICD-401")>0 OR match(DOCUMENT.PROBSAFTER,"ICD-405")>0  ) THEN "" ELSE (if ([phone] == "Yes" AND LASTOBSVALUE("PERSBPGOAL") <> "") then CFMT(LASTTEST2("PERSBPGOAL"), "", "     Personal blood pressure goal: ", "B", "") else ""   endif) ENDIF  + IF NOT (match(DOCUMENT.PROBSAFTER,"ICD-250")>0 OR match(DOCUMENT.PROBSAFTER,"ICD-272")>0) THEN "" ELSE (if ([phone] == "Yes" AND LASTOBSVALUE("PERSLDLGOAL") <> "") then CFMT(LASTTEST2("PERSLDLGOAL"), "", "     Personal LDL goal: ", "B", " ") else ""   endif) ENDIF) ENDIF + IF (STR(OBSNOW("PTPLANMEDMON"), OBSNOW("PTPLANEATING"), OBSNOW("PTPLANACTIV"), OBSNOW("PTPLANOTHER")) == "") THEN "" ELSE (if ([phone] == "Yes") then FMT("   Patient will work on the following items until the next clinic visit to reach self-care goals:", "B") else ""   endif + " " + if ([phone] == "Yes" AND LASTOBSVALUE("PTPLANMEDMON") <> "") then CFMT(LASTTEST2("PTPLANMEDMON"), "", "    Medications and monitoring: ", "B", " ") else ""   endif + if (DOCUMENT.[phone] == "Yes" AND LASTOBSVALUE("PTPLANEATING") <> "") then CFMT(LASTTEST2("PTPLANEATING"), "", "    Eating: ", "B", " ") else ""   endif + if (DOCUMENT.[phone] == "Yes" AND LASTOBSVALUE("PTPLANACTIV") <> "") then CFMT(LASTTEST2("PTPLANACTIV"), "", "    Activity: ", "B", " ") else ""   endif + if ([phone] == "Yes" AND LASTOBSVALUE("PTPLANOTHER") <> "") then CFMT(LASTTEST2("PTPLANOTHER"), "", "    Other: ", "B", " ") else ""   endif) ENDIF + IF NOT (match(DOCUMENT.PROBSAFTER,"ICD-250")>0) THEN "" ELSE (if ([phone] == "Yes" AND LASTOBSVALUE("FREQ HOMEMON") <> "") then CFMT(LASTTEST2("FREQ HOMEMON"), "", "    Home glucose monitoring frequency: ", "B", " ") else ""   endif) ENDIF) ENDIF + IF NOT (match(DOCUMENT.PROBSAFTER,"ICD-250")>0) THEN "" ELSE (IF NOT (TRUE) THEN "" ELSE (if (DOCUMENT.[phone] == "Yes") then CFMT(LASTTEST2("DMSMSUPP"), "", "   Diabetes self-management support: ", "B", " ") else ""   endif) ENDIF + IF NOT ([phone] ,1,"Written self-care plan")==0  ) THEN "" ELSE ("")  ENDIF + IF NOT ([phone] ,1,"Written self-care plan")>0  ) THEN "" ELSE (if (DOCUMENT.[phone] == "Yes") then FMT("  Diabetes care plan printed ", "B") else ""   endif) ENDIF + IF NOT ([phone] ,1,"Education handout")==0) THEN "" ELSE ("") ENDIF + IF NOT ([id number] ,1,"Education handout")>0  ) THEN "" ELSE (if ([phone] == "Yes") then FMT("  Diabetes education handout printed", "B") else ""   endif + " ") ENDIF + if ([phone] == "Yes" AND LASTOBSVALUE("DMSMSUPPACT") <> "") then CFMT(LASTTEST2("DMSMSUPPACT"), "", "   Diabetes self-management support not done because: ", "B", " ") else ""   endif) ENDIF + IF NOT (match(DOCUMENT.PROBSAFTER,"ICD-250")>0) THEN "" ELSE (IF NOT (TRUE) THEN "" ELSE (if ([phone] == "Yes") then CFMT(LASTOBSVALUE("LSTSELFMNGD"), "", "  Last diabetes self-management training by diabetes educator: ", "B", " ") else ""   endif) ENDIF + IF NOT (match(ORDERS_NEW("comma"),1,"Diabetic Clinic Referral")>0) THEN "" ELSE (if (DOCUMENT.[phone] == "Yes") then FMT("  Referred for diabetes self-mgmt training. ", "B") else ""   endif) ENDIF + IF NOT (not ( (LASTOBSVALUE("LSTSELFMNGD") == "")  OR (LASTOBSVALUE("LSTSELFMNGD") <> "" AND LASTOBSVALUE("HGBA1C") > 7 AND  val(DURATIONDAYS(str(LASTOBSDATE("LSTSELFMNGD")),str(._TODAYSDATE))) >= 365)  ) AND match(ORDERS_NEW("comma"),1,"Diabetic Clinic Referral")==0 ) THEN "" ELSE ("") ENDIF + IF NOT (( (LASTOBSVALUE("LSTSELFMNGD") == "")  OR (LASTOBSVALUE("LSTSELFMNGD") <> "" AND LASTOBSVALUE("HGBA1C") > 7 AND  val(DURATIONDAYS(str(LASTOBSDATE("LSTSELFMNGD")),str(._TODAYSDATE))) >= 365)  ) AND match(ORDERS_NEW("comma"),1,"Diabetic Clinic Referral")==0 ) THEN "" ELSE ("") ENDIF) ENDIF + IF NOT (match(DOCUMENT.PROBSAFTER,"ICD-401")>0 OR match(DOCUMENT.PROBSAFTER,"ICD-405")>0 OR  match(DOCUMENT.PROBSAFTER,"ICD-272")>0   OR match(DOCUMENT.PROBSAFTER,"ICD-250")>0) THEN "" ELSE (IF NOT (TRUE) THEN "" ELSE (if ([phone] == "Yes") then CFMT(LASTOBSVALUE("LSTNUTRITION"), "", "  Last medical nutrition therapy: ", "B", " ") else ""   endif) ENDIF + IF NOT (( ((LASTOBSVALUE("BP SYSTOLIC") > 891  OR LASTOBSVALUE("BP DIASTOLIC") > 90 OR QXIHWTUUEKCM("KLKJ1P") > 7 OR LASTOBSVALUE("LDL") > 130  OR LASTOBSVALUE("BMI") > 30) AND LASTOBSVALUE("LSTNUTRITION") == "")  OR (LASTOBSVALUE("LSTNUTRITION") <> "" AND (LASTOBSVALUE("BP SYSTOLIC") > 915  OR LASTOBSVALUE("BP DIASTOLIC") > 90 OR AVWPVXYIAXKP("VVZS8O") > 7 OR LASTOBSVALUE("LDL") > 130  OR LASTOBSVALUE("BMI") > 30)  AND  val(DURATIONDAYS(str(LASTOBSDATE("LSTNUTRITION")),str(._TODAYSDATE))) >= 180)  ) AND match(ORDERS_NEW("comma"),1,"Nutrition Referral")==0 ) THEN "" ELSE ("") ENDIF + IF NOT (match(ORDERS_NEW("comma"),1,"Nutrition Referral")>0) THEN "" ELSE (if (DOCUMENT.[phone] == "Yes") then FMT("  Referred.", "B") else ""   endif + " ") ENDIF + IF NOT (not ( ( ((LASTOBSVALUE("BP SYSTOLIC") > 197  OR LASTOBSVALUE("BP DIASTOLIC") > 90 OR JOITGPQDIYME("BRAX0N") > 7 OR LASTOBSVALUE("LDL") > 130  OR LASTOBSVALUE("BMI") > 30) AND LASTOBSVALUE("LSTNUTRITION") == "")  OR (LASTOBSVALUE("LSTNUTRITION") <> "" AND (LASTOBSVALUE("BP SYSTOLIC") > 407  OR LASTOBSVALUE("BP DIASTOLIC") > 90 OR WKGSUPJSRPRX("YVOP9Y") > 7 OR LASTOBSVALUE("LDL") > 130  OR LASTOBSVALUE("BMI") > 30)  AND  val(DURATIONDAYS(str(LASTOBSDATE("LSTNUTRITION")),str(._TODAYSDATE))) >= 90)  ) ) AND match(ORDERS_NEW("comma"),1,"Nutrition Referral")==0 ) THEN "" ELSE ("") ENDIF) ENDIF + IF NOT (match(DOCUMENT.PROBSAFTER,"ICD-401")>0 OR match(DOCUMENT.PROBSAFTER,"ICD-405")>0 ) THEN "" ELSE (IF NOT (TRUE) THEN "" ELSE (if ([phone] == "Yes") then CFMT(LASTTEST2("HTNSMSUPP"), "", "   Hypertension  self-management support: ", "B", " ") else ""   endif) ENDIF + IF NOT ([id number] ,1,"Written self-care plan")==0) THEN "" ELSE ("") ENDIF + IF NOT ([phone] ,1,"Written self-care plan")>0) THEN "" ELSE (if (DOCUMENT.[phone] == "Yes") then FMT("  Hypertension self-care plan printed.", "B") else ""   endif + " ") ENDIF + IF NOT ([phone] ,1,"Education handout")==0) THEN "" ELSE ("") ENDIF + IF NOT ([phone] ,1,"Education handout")>0) THEN "" ELSE (if (DOCUMENT.[phone] == "Yes") then FMT("  Hypertension education handout printed", "B") else ""   endif + " ") ENDIF + if ([phone] == "Yes" AND LASTOBSVALUE("HTNSMSUPPACT") <> "") then CFMT(LASTTEST2("HTNSMSUPPACT"), "", "   Hypertension self-management support not done because: ", "B", " ") else ""   endif) ENDIF + IF NOT (match(DOCUMENT.PROBSAFTER,"ICD-272")>0     ) THEN "" ELSE (IF NOT (TRUE) THEN "" ELSE (if (DOCUMENT.[phone] == "Yes") then CFMT(LASTTEST2("LIPSMSUPP"), "", "   Lipid self-management support: ", "B", "   ") else ""   endif) ENDIF + IF NOT ([phone] ,1,"Written self-care plan")==0) THEN "" ELSE ("") ENDIF + IF NOT ([phone] ,1,"Written self-care plan")>0) THEN "" ELSE (if (DOCUMENT.[phone] == "Yes") then FMT("Lipid self-care plan printed.", "B") else ""   endif + " ") ENDIF + IF NOT ([phone] ,1,"Education handout")==0) THEN "" ELSE ("") ENDIF + IF NOT ([phone] ,1,"Education handout")>0) THEN "" ELSE (if (DOCUMENT.[phone] == "Yes") then FMT("  Lipid education handout printed", "B") else ""   endif + " ") ENDIF + if (DOCUMENT.[phone] == "Yes" AND LASTOBSVALUE("LIPSMSUPPACT") <> "") then CFMT(LASTTEST2("LIPSMSUPPACT"), "", "   Lipid self-management support not done because: ",  "B", " ") else ""   endif) ENDIF + if ([phone] == "Yes") then CFMT(DOCUMENT.[id number], "", "   Self-management comments: ", "B", " ") else ""   endif + IF NOT (  [phone] ,1,"Resources for patients handout")==0 AND [phone] ,1,"Resources for patients handout")==0 AND [phone] ,1,"Resources for patients handout")==0) THEN "" ELSE ("") ENDIF + IF NOT ([phone] ,1,"Resources for patients handout")>0 OR [phone] ,1,"Resources for patients handout")>0 OR match( [id number] ,1,"Resources for patients handout")>0) THEN "" ELSE (if (DOCUMENT.[phone] == "Yes") then FMTApple Computer printed.", "B") else ""   endif + " ") ENDIF + CFMT(DOCUMENT.[id number], "", "  Nursing Instructions:" + HRET, "B,1", " ") <-FUNCTION DEFINITION IS NOT EXECUTABLE  Appended Document: HFU/DR. YANG/DR. WATSON ON SERVICE/DS I saw and examined Ms Sieg with Dr Shon Baton and I agree with her note above. Ms Liggins looks great today. Her left knee is not red nor warm. There is possibly a slight elargement of the left knee. Not enough concern to tap today so will arrange close F/U. Refer to [facility] for drug treatment assistance and disability assistance. Dr Lynnae January  Appended Document: HFU/DR. YANG/DR. WATSON ON SERVICE/DS Office notes faxed to Dr Excell Seltzer per Dr Shon Baton.

## 2010-11-04 NOTE — Assessment & Plan Note (Signed)
Summary: Social Work  Social Work.  80 minutes. Patient known to social work with recent hospitalization for cocaine poisoning, vasculitis, and other complications.  History of homelessness and long term incarceration.  Worked with patient, Dr. Nance Pew, and residents to come up with plan for [name] who is tearful, distraught, and in pain today.  She reports that her last day at [facility] assisted living will be tomorrow as letter of guarantee given by the hospital has run out per my conversation with [name] (Clinical biochemist at [employer]) and also [name] from Inpatient social work. We were able to make [name] an outpatient appmt for [facility] on July 20th in which she has all the information on an index card,  but in the meantime she was needing housing and more immediate mental health services.  Though Dr. Nance Pew confirmed that she was not a threat to herself in terms of suicidal ideation or plan. However she does have a pending disability claim in which documented mental health assessment and treatment would be helpful to her case.   Discussion with [name], the PM attending:  She suggested an admission so that letter of guarantee could be renewed.  In the meantime [facility] was called to see if there were any residential substance abuse options.   SW follow-up.

## 2010-11-04 NOTE — Progress Notes (Signed)
Summary: Soc. Work  Barista of Call: 06/10/10 Communicated with Jeraldine Loots attny at [facility] and faxed hospital d/c summaries for July and August so he can expedite disability.

## 2010-11-04 NOTE — Discharge Summary (Signed)
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  She was on doxycycline to prevent infection from the thigh ulcer.  She was placed on a steroid taper (to be completed slowly over 4 weeks) and pain medication.  During her admission she had spiked 2 fevers that were adequately treated with physical cooling methods and are thought to be 2/2 her vasculitis.  She is to follow-up with Dr. Ronny Flurry on July 20 at 3pm.  Other labs needed at follow-up: UDS  Problem list changes:  Changed problem from Springer (ICD-447.6) to VASCULITIS (ICD-447.6)  Medication list changes:  Removed medication of LABETALOL HCL 100 MG TABS (LABETALOL HCL) take one tablet by mouth twice daily Changed medication from ZOLOFT 50 MG TABS (SERTRALINE HCL) Take 1 tablet by mouth once a day to CARVEDILOL 12.5 MG TABS (CARVEDILOL) Take 1 tablet by mouth two times a day - Signed Added new medication of SERTRALINE HCL 100 MG TABS (SERTRALINE HCL) Take 1 tablet by mouth once a day - Signed Added new medication of OXYCODONE HCL 10 MG TABS (OXYCODONE HCL) take 1 tablet by mouth q4 hours as needed for pain - Signed Added new medication of PREDNISONE 10 MG TABS (PREDNISONE) Take 4 tabs by mouth daily for 4 days, then take 3 tabs by mouth daily for 7 days, then take 2 tabs by mouth daily for 7 days, then take 1 tab by mouth daily for 7 days - Signed Changed medication from SERTRALINE HCL 100 MG TABS (SERTRALINE HCL) Take 1 tablet by mouth once a day to SERTRALINE HCL 50 MG TABS (SERTRALINE HCL) Take 1 tablet by mouth once a day - Signed Rx of CARVEDILOL 12.5 MG TABS (CARVEDILOL) Take 1 tablet by mouth two times a day;  #60 x 6;  Signed;  Entered by: Rikki Spearing, MD;  Authorized by: Rikki Spearing, MD;  Method used: Print then Give to  Patient Rx of SERTRALINE HCL 100 MG TABS (SERTRALINE HCL) Take 1 tablet by mouth once a day;  #30 x 6;  Signed;  Entered by: Rikki Spearing, MD;  Authorized by: Rikki Spearing, MD;  Method used: Print then Give to Patient Rx of OXYCODONE HCL 10 MG TABS (OXYCODONE HCL) take 1 tablet by mouth q4 hours as needed for pain;  #30 x 0;  Signed;  Entered by: Rikki Spearing, MD;  Authorized by: Rikki Spearing, MD;  Method used: Print then Give to Patient Rx of PREDNISONE 10 MG TABS (PREDNISONE) Take 4 tabs by mouth daily for 4 days, then take 3 tabs by mouth daily for 7 days, then take 2 tabs by mouth daily for 7 days, then take 1 tab by mouth daily for 7 days;  #61 x 0;  Signed;  Entered by: Rikki Spearing, MD;  Authorized by: Rikki Spearing, MD;  Method used: Print then Give to Patient Rx of SERTRALINE HCL 50 MG TABS (SERTRALINE HCL) Take 1 tablet by mouth once a day;  #30 x 6;  Signed;  Entered by: Rikki Spearing, MD;  Authorized by: Rikki Spearing, MD;  Method used: Print then Give to Patient  Allergy list changes:  Added new allergy or adverse reaction of TYLENOL  The medication, problem, and allergy lists have been updated.  Please see the dictated discharge summary for details.  Discharge medications:  CARVEDILOL 12.5  MG TABS (CARVEDILOL) Take 1 tablet by mouth two times a day SERTRALINE HCL 50 MG TABS (SERTRALINE HCL) Take 1 tablet by mouth once a day OXYCODONE HCL 10 MG TABS (OXYCODONE HCL) take 1 tablet by mouth q4 hours as needed for pain PREDNISONE 10 MG TABS (PREDNISONE) Take 4 tabs by mouth daily for 4 days, then take 3 tabs by mouth daily for 7 days, then take 2 tabs by mouth daily for 7 days, then take 1 tab by mouth daily for 7 days  Other patient instructions:  Please follow-up with Dr. Ronny Flurry on Wednesday, July 20 at 3pm in the Outpatient Medicine clinic.  Please take your medicines as instructed.  Note: Hospital Discharge Medications & Other Instructions handout was printed, one copy for  patient and a second copy to be placed in hospital chart.

## 2010-11-04 NOTE — Initial Assessments (Signed)
Summary: Admission H&P  INTERNAL MEDICINE TEACHING SERVICE ADMISSION HISTORY & PHYSICAL   Attending: Dr. Eulis Canner First contact: Dr. Vinnie Langton [phone] Second contact: Dr. Manuella Ghazi [phone] (Mariano Colon, after-hours: [phone], [phone])     PCP: [facility]   CC: LE ulcer   HPI: 50 yo woman with relapsing polychondritis, polysubstance abuse and homelessness. Has history of multiple infections in the lower extremity, most recently in Feb 2011, when she was treated for MSSA with doxy, but it is doubtful she completed her oral regimen. She was in the ER about two weeks ago for a flare of her LE infection. She mentioned of bug bites and continuous scratching at that time. She was noted to have multiple boils and ulcers in her LE and hip and was sent home on Bactrim and mupirocin. Today she came back with worsening symptoms, apparently she was applying mustard to her wounds. There are multiple wound on her LE, more prominent on the left side. The largest one is in posterior aspect of left foot above the achilles. It smells bad and is causing her severe pain. There are multiple boils as well. She has also noted some blackish discoloration in and around her toes.    Allergies:  NKDA   PAST MEDICAL HISTORY:  1. Relapsing polychondritis.   2. Polyarthritis.   3. Recurrent lower extremity cellulitis (MSSA Feb 2011)  3. Hemoptysis.   4. Iron deficiency anemia.   5. Polysubstance abuse- smokes crack once weekly and marijuana occasionally, ongoing tobacco use   MEDICATIONS: Ibuprofen  SOCIAL HISTORY: currently homeless, staying at a hotel history of polysubstance abuse- smokes crack weekly and marijuana occasionally, smokes 4 cigs daily x 30 years, no EtOH use.  FAMILY HISTORY noncontributory  Review of System: Negative except per HPI.    Vital Signs: BP-159/136   HR-142     RR-24   Temp-99.2   Sat-100%/ra  Physical Exam: GEN- Distressed due to pain.  HEENT- NCAT, PERRL, no icterus/pallor,  erythematous ears bilaterally with superficial abrasions.  LUNGS- clear to auscultation.   CVS- regular tachy,  no murmur ABD- soft, non-tender, normal bowel sounds.   EXT- LE: appears blackish around the toes bilterally, tender to touch bilat, multiple excoriation marks and boils palpable esp on L thigh. Behind left leg, 5 cm ulcerated wound, foul smelling. Palpable dorsalis pedis and posterior tibial bilatera.  NEURO- no focal deficit. PSY- appropriate.    LAB RESULTS            11.1 3.9 >-----------<320          ANC: 3.4                MCV:83.1   137        104           14 ----------------------------------<131    3.7           23          0.7  Calcium:1.13       Anion gap:  10              ASSESSMENT & PLAN  LE wound / ulcer / gangrene: She needs broad spectrum coverage to begin with, will start her on vanc and zosyn. Her wound on the leg will need debridement. The boil on the left thigh will need I&D too. Also concerning is her gangrenous looking toes. I can feel good pulses distally. We are getting ABI. Will conult surgery for further input.  HTN  crisis: This is most likely due to cocaine, she was cocaine positive two weeks ago. Will treat her with labetalol.  Relapsing polychondritis: She is supposed to be on hydroxychloroquine, which she is not taking. She will need to be restarted on her meds when infection is controlled.  Polysubstance abuse: She will need strong counselling.  Anemia: She has normocytic anemia, this is likely anemia of chronic disease. She has anemia panel from last Aug which confirms this. (Fe <10, Ft 85, B12 468, Fol 6) Homelessness: CSW consult once ready for d/c.  Prophylaxis: heparin s q.   ATTENDING PHYSICIAN: I performed and/or observed a history and physical examination of the patient.  I discussed the case with the residents as noted and reviewed the residents' notes.  I agree with the findings and plan--please refer to the attending physician  note for more details.  Signature________________________________  Printed Name_____________________________

## 2010-11-04 NOTE — Progress Notes (Signed)
  Phone Note Outgoing Call   Call placed by: Patient Summary of Call: [name] called and said she kept her appmt at [facility] and they are getting her into a 14 day treatment program.  She said that her time at [facility] is running out but she thinks her disability will be approved.  She said to call [name] at [phone] about time left at [facility].    [name] at [facility] is arranging tment.   [facility] [employer], [employer] is handling disability claim.   [name] has appmt with Dr. Shon Baton on Thursday at 10:30.

## 2010-11-04 NOTE — Discharge Summary (Signed)
Summary: Hospital Discharge Update    Hospital Discharge Update:  Date of Admission: 08/05/2010 Date of Discharge: 08/13/2010  Brief Summary:  50 yo woman presented for shingles x 3 weeks of left axilla and worsening of left shin wound secondary to levamisole vasculitis.  Patient was treated with acyclovir and scheduled ibuprofen and as needed tramadol for pain.  Wound care was consulted for her lesions.  Her left shin wound appears to be secondary to vasculitis, we tried Nitroglycerin topical as a vasodilator to help increase healing process; however, she reports increasing pain so we discontinued.  We also started her on Amlodipine for her slight elevated BP because it also has a vasodilator effect for her vasculitis.  We also started her on Prednisone and Doxycycline.  She reports that she has an appointment with a rheumatologist at [facility] on 08/21/10.   Other follow-up issues:  1. shingle lesions 2. left shin wound 2/2 vasculitis  Problem list changes:  Added new problem of SHINGLES (ICD-053.9)  Medication list changes:  Removed medication of ACYCLOVIR 800 MG TABS (ACYCLOVIR) Take 1 tablet by mouth every 4 hours X 7 days Added new medication of PREDNISONE 10 MG TABS (PREDNISONE) take 4 tablets a day x 2 days, then 2 tablets x 2 days, then 1 tablet x 2 days, then stop - Signed Added new medication of DOXYCYCLINE HYCLATE 100 MG TABS (DOXYCYCLINE HYCLATE) 1 tablet by mouth two times a day x 4 days - Signed Added new medication of IBUPROFEN 400 MG TABS (IBUPROFEN) 1 tablet by mouth every 6 hours for pain x 7 days - Signed Rx of PREDNISONE 10 MG TABS (PREDNISONE) take 4 tablets a day x 2 days, then 2 tablets x 2 days, then 1 tablet x 2 days, then stop;  #14 x 0;  Signed;  Entered by: Marcelle Smiling MD;  Authorized by: Marcelle Smiling MD;  Method used: Handwritten Rx of DOXYCYCLINE HYCLATE 100 MG TABS (DOXYCYCLINE HYCLATE) 1 tablet by mouth two times a day x 4 days;  #8 x 0;  Signed;  Entered by:  Marcelle Smiling MD;  Authorized by: Marcelle Smiling MD;  Method used: Handwritten Rx of IBUPROFEN 400 MG TABS (IBUPROFEN) 1 tablet by mouth every 6 hours for pain x 7 days;  #28 x 0;  Signed;  Entered by: Marcelle Smiling MD;  Authorized by: Marcelle Smiling MD;  Method used: Handwritten  The medication, problem, and allergy lists have been updated.  Please see the dictated discharge summary for details.  Discharge medications:  FLUOXETINE HCL 20 MG CAPS (FLUOXETINE HCL) Take 1 capsule by mouth once a day for depression VITAMIN B-12 1000 MCG TABS (CYANOCOBALAMIN) Take 1 tablet by mouth once a day BENADRYL 25 MG CAPS (DIPHENHYDRAMINE HCL) 1 tab every 6 hrs as needed for itching MULTIVITAMINS  CAPS (MULTIPLE VITAMIN) Take 1 tablet by mouth once a day TRAMADOL HCL 50 MG TABS (TRAMADOL HCL) Take 1 tablet by mouth every 6 hours as needed for pain PREDNISONE 10 MG TABS (PREDNISONE) take 4 tablets a day x 2 days, then 2 tablets x 2 days, then 1 tablet x 2 days, then stop DOXYCYCLINE HYCLATE 100 MG TABS (DOXYCYCLINE HYCLATE) 1 tablet by mouth two times a day x 4 days IBUPROFEN 400 MG TABS (IBUPROFEN) 1 tablet by mouth every 6 hours for pain x 7 days  Other patient instructions:  Please take doxycycline 178m by mouth two times a day x 4 days Take Ibuprofen for pain Take Prednisone tapering dose  as prescribed Follow up with rheumatologist on 08/21/10 ([place]) Follow up with Internal Medicine Clinic [phone] on 09/18/2010 at [facility] with Dr. Shon Baton  Note: Hospital Discharge Medications & Other Instructions handout was printed, one copy for patient and a second copy to be placed in hospital chart.

## 2010-11-04 NOTE — Assessment & Plan Note (Signed)
Summary: ACUTE-ULCER ON HIP/CFB   Vital Signs:  Patient profile:   50 year old female Height:      61.5 inches Weight:      93.8 pounds BMI:     17.50 Temp:     98.3 degrees F Pulse rate:   100 / minute BP sitting:   96 / 65  (right arm)  Is Patient Diabetic? Yes Pain Assessment Patient in pain? yes     Location: all over Intensity: 10 Type: hurts Onset of pain  since 04/10/10 Nutritional Status BMI of < 19 = underweight Nutritional Status Detail appetite ok  Have you ever been in a relationship where you felt threatened, hurt or afraid?denies   Does patient need assistance? Functional Status Self care Ambulation Impaired:Risk for fall, Wheelchair Comments Uses a cane. Since 04/10/10 - hurts all over, "knots" on head, chest congestion, both ears hurt, sore on left hip and left knee swollen. Report given to pt for Room 5123 - transfered via w/c.  5:05PM.   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 She was crying all the time with some pauses. She has pain most in the middle of her chest, choking, like something stuck inside the chest. It doesn't move anywhere else and is localized and reproduced by palpation at mid-sternum.  She has pain in her both elbows, knees and also her head.  She has an 2x2 ulcer, which was dressed, on her L lateral thigh, about 8 cm below ASIS. It has central red granulation tissue.  She has no fever, abd pain, urinary abn.  Depression History:      The patient denies a depressed mood most of the day and a diminished interest in her usual daily activities.         Habits & Providers  Alcohol-Tobacco-Diet  Tobacco Status: current     Cigarette Packs/Day: 3-4 CIGS PER DAY     Year Started: 1980'S  Exercise-Depression-Behavior     Does Patient Exercise: yes     Type of exercise: PT     Times/week: 30  Current Medications (verified): 1)  Labetalol Hcl 100 Mg Tabs (Labetalol Hcl) .... Take One Tablet By Mouth Twice Daily 2)  Zoloft 50 Mg Tabs (Sertraline Hcl) .... 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 No active SI/HI. Tearful, psychomotor symptoms. Self neglect. Hx of severe substance abuse. Severe depression with self neglect. Has been homeless, living [place] bridge in [place] for many years. No family contact in 17+years. Was in prison for minor charges 17+ years ago and lost contact with family at that point. Came to [place] for work. Has been trying to get disability for 3 years unsuccessfully. Has never had treatment for her depression, except when she was in prison. Heavy alcohol and cocaine abuse history.   Her updated medication list for this problem includes:    Zoloft 50 Mg Tabs (Sertraline hcl) .Marland Kitchen... Take 1 tablet by mouth once a day  Orders: T-Urinalysis ([id number])  Complete Medication List:  1)  Labetalol Hcl 100 Mg Tabs (Labetalol hcl) .... Take one tablet by mouth twice daily 2)  Zoloft 50 Mg Tabs (Sertraline hcl) .... Take 1 tablet by mouth once a day  Other Orders: T-Drug Screen-Urine, (single) [id number]) T-CMP with Estimated GFR ([id number]) T-CBC w/Diff [id number]) Process Orders Check Orders Results:     [facility]: ABN not required for this insurance Tests Sent for requisitioning (April 18, 2010 5:01 PM):     04/11/2010: [facility] -- T-Drug Screen-Urine, (single) [[id number]] (signed)     04/11/2010: [facility] -- T-CMP with  Estimated GFR [[id number]] (signed)     04/11/2010: [facility] -- T-CBC w/Diff [[id number]] (signed)     04/11/2010: [facility] -- T-Urinalysis [[id number]] (signed)    Prevention & Chronic Care Immunizations   Influenza vaccine: Not documented    Tetanus booster: Not documented    Pneumococcal vaccine: Not documented  Other Screening   Pap smear: Not documented    Mammogram: Not documented   Smoking status: current  (04/11/2010)  Lipids   Total Cholesterol: Not documented   LDL: Not documented   LDL Direct: Not documented   HDL: Not documented   Triglycerides: Not documented  Hypertension   Last Blood Pressure: 96 / 65  (04/11/2010)   Serum creatinine: Not documented   Serum potassium Not documented  Self-Management Support :   Personal Goals (by the next clinic visit) :      Personal blood pressure goal: 140/90  (03/06/2010)   Patient will work on the following items until the next clinic visit to reach self-care goals:     Medications and monitoring: take my medicines every day, check my blood pressure, bring all of my medications to every visit, weigh myself weekly  (04/11/2010)     Eating: drink diet soda or water instead of juice or soda, eat more vegetables, use fresh or frozen vegetables, eat fruit for snacks and desserts, limit or avoid alcohol  (04/11/2010)     Activity: take a 30 minute walk every day  (04/11/2010)    Hypertension self-management support: Written self-care plan, Education handout, Resources for patients handout  (04/11/2010)   Hypertension self-care plan printed.   Hypertension education handout printed      Resource handout printed.  Appended Document: ACUTE-ULCER ON HIP/CFB Patient admitted for major depression and for wound care to left hip.

## 2010-11-04 NOTE — Progress Notes (Signed)
Summary: Soc. Work  Dealer placed by: Soc. Work Call placed to: Patient Summary of Call: Encouraged patient to make another appmt with [facility].  I've asked her to call me back with the date and time so we can ensure she is being served.   Follow-up for Phone Call        Patient lost [place] phone number and will call today.  In meantime she has asked me to send hospital discharge to attorney [place] and [facility].    [phone] is phone and [phone] is fax.   Additional Follow-up for Phone Call Additional follow up Details #1::        Called Antonio Baker and left message that I needed release in order to send hospital discharge.  Katy Fitch  April 30, 2010 10:55 AM

## 2010-11-04 NOTE — Assessment & Plan Note (Signed)
Summary: see dr Anson Fret note, f/u/pcp-watson/hla   Vital Signs:  Patient profile:   50 year old female Height:      61.5 inches (156.21 cm) Weight:      106.0 pounds (42 kg) BMI:     17.24 Temp:     94.1 degrees F (34.50 degrees C) oral Pulse rate:   77 / minute BP sitting:   119 / 72  (left arm) Cuff size:   regular  Vitals Entered By: Lucky Rathke NT II (September 03, 2010 10:13 AM) CC: ROUTINE OFFICE VISIT/    SEE DR MADERA'S NOTE., PATIENT STATES SHE IS ENJOYING WHERE SHE IS NOW LIVING. Is Patient Diabetic? No Pain Assessment Patient in pain? no      Nutritional Status BMI of < 19 = underweight  Have you ever been in a relationship where you felt threatened, hurt or afraid?No   Does patient need assistance? Functional Status Self care Ambulation Normal   Primary Care Provider:  Rikki Spearing, MD  CC:  ROUTINE OFFICE VISIT/    SEE DR MADERA'S NOTE. and PATIENT STATES SHE IS ENJOYING WHERE SHE IS NOW LIVING.Marland Kitchen  History of Present Illness: This is a 50 year old female with a hx of levamisole vasculitis, depression, and PSA who presents for ED followup for chest pain.  EKG/troponins were negative at the time and pt has used cocaine in the recent past. 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  Pt was discharged with home wound care and was given hydrogel for her leg wound.  Since her discharge from the ED, pt has denied any further episodes of CP, SOB or further cocaine use.  Pt is currently living at [facility] and is "taking good care of herself"  Pt states that she has gained about 20 ibs and has had complete healing of her leg wound.  Pt continues to have small open wounds on her ears bilaterally that are continuing to improve from her last hospitalization.  Pt still has considerable pain from these lesions but states that ibuprofen helps.  Pt has now had complete resolution of her  shingles and denies any fever, chills, or change in her BM's.    Depression History:      The patient denies a depressed mood most of the day and a diminished interest in her usual daily activities.         Preventive Screening-Counseling & Management  Alcohol-Tobacco     Smoking Status: current     Packs/Day: 4-5  CIGS PER DAY     Year Started: 1980'S  Caffeine-Diet-Exercise     Does Patient Exercise: yes     Type of exercise: PT     Times/week: 30  Allergies: 1)  ! 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 Louanne Skye HTN - not currently requiring any medication Polysubstance abuse Depression  Family History: Reviewed history from 05/08/2010 and no changes required. Colon cancer? in aunt breast cancer in mother  Social History: Reviewed history from 03/06/2010 and no changes required. Unemployed:  cleaning in past Living at [place] prior crack/cocaine use tobacco:  1/2 ppd alcohol:  none  Review of Systems  Negative as per HPI.   Physical Exam  General:  alert and well-developed.   Head:  normocephalic.  Scars on cheeks from previous lesions.  Eyes:  vision grossly intact, pupils equal, pupils round, and pupils reactive to light.   Ears:   Healing, scabbed lesions on the penna bilaterally. Approx 2cm.  Nose:  no nasal discharge.   Mouth:  pharynx pink and moist.   Neck:  supple.   Chest Wall:  No lesioins in either axilla.  Lungs:  normal respiratory effort, normal breath sounds, no crackles, and no wheezes.   Heart:  normal rate, regular rhythm, no murmur, no gallop, and no rub.   Abdomen:  soft, non-tender, normal bowel sounds, no distention, and no masses.   Msk:  normal ROM.   Pulses:  2+ dp/pt pulses Extremities:  complete healing  of prior left leg wound.  Neurologic:  cranial nerves II-XII intact.   Skin:  No rashes or nodules currently.  Psych:  Very positive affect.    Impression & Recommendations:  Problem # 1:  CHEST PAIN (ICD-786.50) This has now resolved and is very likely secondary to her prior cocaine use.  Pt has been counseled regarding the importance of remaining abstinent but continues not to be a candidate for inpatient rehab because of her open wounds.  As pts clinical condition improves this goal should continue to be a priority as pt seems motivated to change.  Problem # 2:  DEPRESSION, SEVERE (ICD-311) Pt had a very positive affect today.  Denies issues with depression at this point in time.  Will continue current regimen.  Her updated medication list for this problem includes:    Fluoxetine Hcl 20 Mg Caps (Fluoxetine hcl) .Marland Kitchen... Take 1 capsule by mouth once a day for depression  Problem # 3:  VASCULITIS (ICD-447.6) Leg has now completely healed. Pt continues to get occasional subcutaneous nodules but these spontaneously resolve.  Will continue patient on her tramadol and ibuprofen for now given her ear pain.  Pt has seen rheumatologist at [facility] and they will be following up with her in 6 months. Pt counseled on the importance of avoiding cocaine.  Problem # 4:  Preventive Health Care (ICD-V70.0) I did refer patient for a mammogram today and a pap smear should be considered at her next visit if it is deemed appropriate at that time.    Orders: Mammogram (Screening) (Mammo)  Problem # 5:  SHINGLES (ICD-053.9) Resolved pt does not complain of any post herpetic neuralgia.   Problem # 6:  ESSENTIAL HYPERTENSION (ICD-401.9) Pt was placed on norvasc upon hospital discharge but has not been taking it acording to her and her MAR.  I will not add it back on today given that her BP is under good control.   Complete Medication List: 1)  Fluoxetine Hcl 20 Mg Caps (Fluoxetine hcl) .... Take 1 capsule by  mouth once a day for depression 2)  Vitamin B-12 1000 Mcg Tabs (Cyanocobalamin) .... Take 1 tablet by mouth once a day 3)  Benadryl 25 Mg Caps (Diphenhydramine hcl) .Marland Kitchen.. 1 tab every 6 hrs as needed for itching 4)  Multivitamins Caps (Multiple vitamin) .... Take 1 tablet by mouth once a day 5)  Tramadol Hcl 50 Mg Tabs (Tramadol hcl) .... Take 1 tablet by mouth every 6 hours as needed for pain 6)  Prednisone 10 Mg Tabs (Prednisone) .... Take 4 tablets a day x 2 days, then 2 tablets x 2 days, then 1 tablet x  2 days, then stop 7)  Doxycycline Hyclate 100 Mg Tabs (Doxycycline hyclate) .Marland Kitchen.. 1 tablet by mouth two times a day x 4 days 8)  Ibuprofen 400 Mg Tabs (Ibuprofen) .Marland Kitchen.. 1 tablet by mouth every 6 hours for pain  Patient Instructions: 1)  Continue doing what you are doing.  You look very good today. Please continue to try to avoid using cocaine as it is what causes a lot of your problems.  Please make the next available appointment with Dr. Shon Baton.  Prescriptions: IBUPROFEN 400 MG TABS (IBUPROFEN) 1 tablet by mouth every 6 hours for pain  #60 x 1   Entered and Authorized by:   Jola Schmidt MD   Signed by:   Jola Schmidt MD on 09/03/2010   Method used:   Print then Give to Patient   RxID:   [id number]    Orders Added: 1)  Est. Patient Level III [60109] 2)  Mammogram (Screening) [Mammo]     Prevention & Chronic Care Immunizations   Influenza vaccine: Not documented    Tetanus booster: Not documented    Pneumococcal vaccine: Not documented  Other Screening   Pap smear: Not documented   Pap smear action/deferral: Deferred  (05/08/2010)    Mammogram: Not documented   Mammogram action/deferral: Deferred  (05/08/2010)   Smoking status: current  (09/03/2010)  Lipids   Total Cholesterol: Not documented   LDL: Not documented   LDL Direct: Not documented   HDL: Not documented   Triglycerides: Not documented  Hypertension   Last Blood Pressure: 119 / 72  (09/03/2010)    Serum creatinine: Not documented   Serum potassium Not documented  Self-Management Support :   Personal Goals (by the next clinic visit) :      Personal blood pressure goal: 140/90  (03/06/2010)   Patient will work on the following items until the next clinic visit to reach self-care goals:     Medications and monitoring: take my medicines every day  (09/03/2010)     Eating: eat more vegetables, use fresh or frozen vegetables, eat foods that are low in salt, eat baked foods instead of fried foods, eat fruit for snacks and desserts, limit or avoid alcohol  (09/03/2010)     Activity: take a 30 minute walk every day  (09/03/2010)    Hypertension self-management support: Resources for patients handout  (07/30/2010)   Appended Document: see dr Anson Fret note, f/u/pcp-watson/hla I saw and talked with Santiago Glad today and participated in the development of the A/P by Dr Valetta Close. I agree with his A/P as outlined above. Berdie looks great today!

## 2010-11-04 NOTE — Progress Notes (Signed)
Summary: HH not able to locate patient  Phone Note From Other Clinic   Caller: [facility] Nurse Summary of Call: voicemail message FYI:  Pt was D/c from [facility] are not able to locate the patient.   HH will D/C services ordered . Orland Mustard RN  June 18, 2010 10:00 AM

## 2010-11-04 NOTE — Assessment & Plan Note (Signed)
Summary: EST-2 WEEK RECHECK/CH   Vital Signs:  Patient profile:   50 year old female Height:      61.5 inches (156.21 cm) Weight:      95.5 pounds (43.41 kg) BMI:     17.82 Temp:     98.8 degrees F (37.11 degrees C) oral Pulse rate:   93 / minute BP sitting:   120 / 72  (right arm)  Vitals Entered By: Hilda Blades Ditzler RN (May 22, 2010 10:38 AM) Is Patient Diabetic? No Pain Assessment Patient in pain? yes     Location: all joints Intensity: 8 Type: arthritis Onset of pain  long time Nutritional Status BMI of < 19 = underweight Nutritional Status Detail appetite good  Have you ever been in a relationship where you felt threatened, hurt or afraid?denies   Does patient need assistance? Functional Status Self care Ambulation Normal Comments FU - doing well except arthrirtis joint pain.   Primary Care Provider:  Rikki Spearing, MD   History of Present Illness: Patient presents today for follow-up.  She has noticed new nodules and swelling of her left knee, left 4th PIP joint of her hand, right palm, and behind her left shoulder.  She stopped her presnisone 5 days ago.  She admits to using cocaine yesterday after attending a drug treatment meeting and she is very tearful and distraught about this.  She is pursuing placement at an inpatient detox program and hopes to be placed in the near future.  She also mentions that the wounds on her anterior shins are not healing as well and have become tender in the past few days.  She has also noticed increased drainage. The last visit from wound care to care for these was 2 weeks ago.    No CP, SOB, fevers, abdominal pain, nausea, vomiting, diarrhea.   Depression History:      The patient denies a depressed mood most of the day and a diminished interest in her usual daily activities.         Preventive Screening-Counseling & Management  Alcohol-Tobacco     Smoking Status: current     Packs/Day: 4-5  CIGS PER DAY     Year Started:  1980'S  Caffeine-Diet-Exercise     Does Patient Exercise: yes     Type of exercise: PT     Times/week: 30  Current Medications (verified): 1)  Sertraline Hcl 50 Mg Tabs (Sertraline Hcl) .... Take 1 Tablet By Mouth Once A Day 2)  Oxycodone Hcl 10 Mg Tabs (Oxycodone Hcl) .... Take 1 Tablet By Mouth Q4 Hours As Needed For Pain 3)  Vitamin B-12 1000 Mcg Tabs (Cyanocobalamin) .... Take 1 Tablet By Mouth Once A Day 4)  Sulfamethoxazole-Tmp Ds 800-160 Mg Tabs (Sulfamethoxazole-Trimethoprim) .... Take 1 Tablet By Mouth Twice Daily For 10 Days  Allergies: 1)  ! Tylenol  Past History:  Past Medical History: Reviewed history from 05/08/2010 and no changes required. 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 Louanne Skye HTN - not currently requiring any medication Polysubstance abuse Depression  Past Surgical History: Reviewed history from 05/08/2010 and no changes required. 7/11 - biopsies of bilateral shin nodules  Family History: Reviewed history from 05/08/2010 and no changes required. Colon cancer? in aunt breast cancer in mother  Social History: Reviewed history from 03/06/2010 and no changes required.  Unemployed:  cleaning in past Living at [place] prior crack/cocaine use tobacco:  1/2 ppd alcohol:  none Packs/Day:  4-5  CIGS PER DAY  Review of Systems       see HPI  Physical Exam  General:  thin woman in NAD Ears:  Ears without scabs or crusting; cartilage is missing in some spots 2/2 vasculitis, but the wounds appear to be well-healed. Mouth:  pharynx pink and moist.   Neck:  supple, full ROM, and no masses.   Lungs:  normal respiratory effort, no accessory muscle use, normal breath sounds, no crackles, and no wheezes.   Heart:  normal rate, regular rhythm, and no murmur.   Abdomen:  soft and non-tender.  thin. Msk:  Left  knee is ttp medially and anteriorly.  Pain with active and passive flexion. Warm to the touch but similar to right knee.  No erythema.  Right knee wnl.  Left 4th finger of hand with swollen, painful PIP joint Extremities:  anterior bilateral shins and right ankle with bandages. Skin:  Patient with healed wounds on upper left arm, abdomen, left hip, bilateral feet, and ears.  patient with new subcutaneous swelling of right palm.  .  Left anterior shin wound gaping open with purulent draininage and is ttp and slightly erythematous.  Dressed with bandage.  Right anterior shin wounds with approximated edeges but purulent drainage and slightly erythematous.  Dressed with bandage.  Right heel wound with some slight draininage but non-tender. Psych:  Patient is tearful when discussing her medical history, but she is hopeful and adamant that she wants to get better.  Oriented X3, memory intact for recent and remote, and good eye contact.     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  Patient today presents with new nodules on her hands and increased knee swelling for the last several days.  She is 5 days out from her steroid taper.  These nodules appear similar to those she got during her last hospital admission while on steroids.  I am uncertain if the patient's nodules are related to her levimasole-induced vasculitis or if are due to another process.  The patient seems to have 2 different types of reactions:  one process that involves skin blistering and bleeding and another nodular process. We have pursued our own rheumatologic work-up here at [facility], and though the patient's vasculitic lesions improve with steroids, these nodules appear to come and go independent of steroid use.  The patient endorses using cocaine yesterday, but she had abstained for several weeks prior, and these nodules also appear to be  independent of her cocaine use.  We will not restart the steroids at this time because they do not seem to help these nodules and the patient's more vasculitic areas (ears, feet) are well-healed. We will refer the patient to [facility] rheumatology with the hope that they can further evaluate her vasculitis and nodules and hopefully suggest treatment since the patient cannot be on continue steroids (side effects) and the nodules do not seem to respond to the steroids anyway.  Will continue oxycodone for pain, patient using 2 doses daily.  f/u in 2 weeks.  The wounds from biopsy on the patient's shins appear to be poorly healing and infected.  Will give abx for MRSA coverage x10 day, wound care consult ([facility]) and confirm patient's f/u with surgery at the beginning of next week as the  patient may need debridement of her wound (particularly the left shin wound).  Orders: Misc. Referral (Misc. 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  She denies having used prior to that since before her second-to-last hospital admission.    She understands the consequences of her cocaine use and wants badly to "get rid of my addiction."  Though disappointed that she used yesterday, I am very reassured by her telling me the truth and continuing to come to her appointments.  She will continue to pursue detox options and is on a waiting list, I understand.  We will see her back in 2 weeks.     Problem # 3:  ESSENTIAL HYPERTENSION (ICD-401.9) Assessment: Unchanged At goal off carvedilol.  Continue with no BP meds.  Complete Medication List: 1)  Sertraline Hcl 50 Mg Tabs (Sertraline hcl) .... Take 1 tablet by mouth once a day 2)  Oxycodone Hcl 10 Mg Tabs (Oxycodone hcl) .... Take 1 tablet by mouth q4 hours as needed for pain 3)  Vitamin B-12 1000 Mcg Tabs (Cyanocobalamin) .... Take 1  tablet by mouth once a day 4)  Sulfamethoxazole-tmp Ds 800-160 Mg Tabs (Sulfamethoxazole-trimethoprim) .... Take 1 tablet by mouth twice daily for 10 days  Patient Instructions: 1)  You have been given a referral to be seen at [place]. 2)  You have been given a referral to be seen by wound care. 3)  You have been given a referral to be seen by surgery [facility]) on Tuesday, August 23 at 2:30pm. 4)  You have a new prescription for an antibiotic that you should take once daily for 10 days. 5)  Please stop taking the carvedilol. 6)  I have refilled your oxycodone. Prescriptions: OXYCODONE HCL 10 MG TABS (OXYCODONE HCL) take 1 tablet by mouth q4 hours as needed for pain  #60 x 0   Entered and Authorized by:   Rikki Spearing, MD   Signed by:   Rikki Spearing, MD on 05/22/2010   Method used:   Print then Give to Patient   RxID:   [id number] SULFAMETHOXAZOLE-TMP DS 800-160 MG TABS (SULFAMETHOXAZOLE-TRIMETHOPRIM) Take 1 tablet by mouth twice daily for 10 days  #20 x 0   Entered and Authorized by:   Rikki Spearing, MD   Signed by:   Rikki Spearing, MD on 05/22/2010   Method used:   Print then Give to Patient   RxID:   [id number]    Prevention & Chronic Care Immunizations   Influenza vaccine: Not documented    Tetanus booster: Not documented    Pneumococcal vaccine: Not documented  Other Screening   Pap smear: Not documented   Pap smear action/deferral: Deferred  (05/08/2010)    Mammogram: Not documented   Mammogram action/deferral: Deferred  (05/08/2010)   Smoking status: current  (05/22/2010)  Lipids   Total Cholesterol: Not documented   LDL: Not documented   LDL Direct: Not documented   HDL: Not documented   Triglycerides: Not documented  Hypertension   Last Blood Pressure: 120 / 72  (05/22/2010)   Serum creatinine: Not documented   Serum potassium Not documented  Self-Management Support :   Personal Goals (by the next clinic visit) :       Personal blood pressure goal: 140/90  (03/06/2010)   Patient will work on the following items until the next clinic visit to reach self-care goals:     Medications  and monitoring: take my medicines every day, check my blood pressure, bring all of my medications to every visit, weigh myself weekly  (05/22/2010)     Eating: eat more vegetables, use fresh or frozen vegetables, eat fruit for snacks and desserts, limit or avoid alcohol  (05/22/2010)     Activity: take a 30 minute walk every day  (05/22/2010)    Hypertension self-management support: Written self-care plan, Education handout, Resources for patients handout  (05/22/2010)   Hypertension self-care plan printed.   Hypertension education handout printed      Resource handout printed.  Appended Document: EST-2 WEEK RECHECK/CH I saw and examined Ms Cummings with Dr Shon Baton and I agree with her Hx, PE, and A/P as outlined above. I agree that further rheum W/U is indicated to make certain that there isn't another process going on other than the Levaminole toxicity. Nodules and joint involvment have not been reported in the literature assoc with levamisole. I spoke to Dr Redmond Baseman (did Rheum fellowship at [facility]) previously who said there was no role for steroids in levamisole - pt already tapered off and I agree with Dr Shon Baton not to restart. Pt is quite upset that she relapsed and used cocaine recently but I am glad that she does return to clinic for all her appts (never missed one) and that she is honest with [place] regarding her use. She is trying to get into inpt rehab.

## 2010-11-04 NOTE — Discharge Summary (Signed)
Summary: Hospital Discharge Update    Hospital Discharge Update:  Date of Admission: 04/20/2010 Date of Discharge: 04/26/2010  Brief Summary:  Patient is a 50 y/o female with PMH of levamisole induced vasculitis, depression, substance abuse (ETOH/Cocaine), and homelessness who was admitted to the hospital for multiple pain nodules and worsening pain. After admission, MRI shows 2 fluid collects in left leg one in pretibial 3.1 cm and mid Achiles tendon 1.5 cm, and 1 fluid collection 1.1 cm in right pretibial, we have [facility] consult and they did I&D on 04/23/2010, the fluid was sent for culture, which is negative. Her pain has been controlled, well be discharged to SNF today to continue prednisone and wound care.   Other follow-up issues:  she will have an appointment with her PCP Dr. Shon Baton on 05/08/2010 at 3:00PM to check her pain control and provide drug cessation conselling.  She willl have an appointment with surgery at [phone] on 05/11/2010 at 2:15 PM to her wound.  Medication list changes:  Changed medication from PREDNISONE 10 MG TABS (PREDNISONE) Take 4 tabs by mouth daily for 4 days, then take 3 tabs by mouth daily for 7 days, then take 2 tabs by mouth daily for 7 days, then take 1 tab by mouth daily for 7 days to PREDNISONE 10 MG TABS (PREDNISONE) Take 3 tabs by mouth daily for 6 days, then take 2 tabs by mouth daily for 7 days, then take 1 tab by mouth daily for 7 days Added new medication of VITAMIN B-12 1000 MCG TABS (CYANOCOBALAMIN) Take 1 tablet by mouth once a day - Signed Rx of VITAMIN B-12 1000 MCG TABS (CYANOCOBALAMIN) Take 1 tablet by mouth once a day;  #30 x 3;  Signed;  Entered by: Geanie Kenning MD;  Authorized by: Geanie Kenning MD;  Method used: Handwritten  Discharge medications:  CARVEDILOL 12.5 MG TABS (CARVEDILOL) Take 1 tablet by mouth two times a day SERTRALINE HCL 50 MG TABS (SERTRALINE HCL) Take 1 tablet by mouth once a day OXYCODONE HCL 10 MG TABS (OXYCODONE HCL)  take 1 tablet by mouth q4 hours as needed for pain PREDNISONE 10 MG TABS (PREDNISONE) Take 3 tabs by mouth daily for 6 days, then take 2 tabs by mouth daily for 7 days, then take 1 tab by mouth daily for 7 days VITAMIN B-12 1000 MCG TABS (CYANOCOBALAMIN) Take 1 tablet by mouth once a day  Other patient instructions:  You will have an appointment with her PCP Dr. Shon Baton on 05/08/2010 at 3:00PM. You willl have an appointment with surgery at [phone] on 05/11/2010 at 2:15 PM to her wound. Please change your dressing daily with normal saline wet-dry dressing.  Note: Hospital Discharge Medications & Other Instructions handout was printed, one copy for patient and a second copy to be placed in hospital chart.

## 2010-11-04 NOTE — Assessment & Plan Note (Signed)
Summary: Soc. Work  Soc. Work. 30 minutes.  Patient is walk-in from inpatient and in distress about her housing situation.  She was sent to [facility] from inpatient andt then d/c and sent back to [facility] due to an open wound on her left leg/she's on the hallway and waiting on a room.  The patient is tearful about her situation as she doesn't want to go back on the streets.  She likes it at [facility] and finds she can keep up with her medications much better there.   I listened to [name] and explained to her that it appears that her Disability and Medicaid will be coming thru shortly.  We made a phone call to her attny office and again I had to leave a message telling the receptionist that the situation was quite urgent. Hopefully the attny office will call me back so I can communicate with inpatient and with the director at [facility] and then with Santiago Glad.

## 2010-11-04 NOTE — Discharge Summary (Signed)
Summary: Hospital Discharge Update    Hospital Discharge Update:  Date of Admission: 06/25/2010 Date of Discharge: 06/27/2010  Brief Summary:  This is a 50 year old female with hx levamisole induced vasculitis who presents because of worsening vasculitic lesions of the ear, right 2nd finger, and scalp after using recent cocaine.  The patient was treated supportively while in the hospital for her lesions, however, troponin was found to be slightly elevated (CK-MB WNL).  The patient's troponins trended down during the admission, and there were no new EKG findings. Echo was performed which did not show any wall motion abnormalities. Steroids were not started on this admission.  Lab or other results pending at discharge:  Blood culture negative to date.  Labs needed at follow-up: CBC with differential  Medication list changes:  Removed medication of PERCOCET 10-325 MG TABS (OXYCODONE-ACETAMINOPHEN) Take 1-2 tablets by mouth every 6 hours as needed for pain. Added new medication of OXYCODONE HCL 10 MG TABS (OXYCODONE HCL) Take 1 tab every 6 hours as neede for pain. - Signed Removed medication of PREDNISONE 10 MG TABS (PREDNISONE) Take 3 tabs by mouth daily for 3 days Then, Take 2 tabs by mouth daily for 4 days.  Then, Take 1 tab by mouth daily for 6 days.  Then stop. Rx of OXYCODONE HCL 10 MG TABS (OXYCODONE HCL) Take 1 tab every 6 hours as neede for pain.;  #20 x 0;  Signed;  Entered by: Jola Schmidt MD;  Authorized by: Jola Schmidt MD;  Method used: Print then Give to Patient  Discharge medications:  SERTRALINE HCL 50 MG TABS (SERTRALINE HCL) Take 1 tablet by mouth once a day VITAMIN B-12 1000 MCG TABS (CYANOCOBALAMIN) Take 1 tablet by mouth once a day OXYCODONE HCL 10 MG TABS (OXYCODONE HCL) Take 1 tab every 6 hours as neede for pain.  Other patient instructions:  Please come for an appointment at the outpatient clinic at [facility] on / at :0 pm with Dr.  for a followup  visit.  Please take your medication as prescribed below.  If you have any problem, Please call the clinic.   In case of an emergency  dial 911 or go to the emergency department.    Appended Document: Hospital Discharge Update This note was not completed.  Will update in new note.

## 2010-11-04 NOTE — Discharge Summary (Signed)
Summary: Hospital Discharge Update    Hospital Discharge Update:  Date of Admission: 01/30/2010 Date of Discharge: 02/26/2010  Brief Summary:  admitted for vasculitis 2/2 levamasol toxicity (component of cocaine) please look at h&p in EMR for details.  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) Added new problem of VASCULITIS (ICD-447.6) Added new problem of ESSENTIAL HYPERTENSION (ICD-401.9)  Medication list changes:  Added new medication of NORCO 10-325 MG TABS (HYDROCODONE-ACETAMINOPHEN) take 1-2 tablets every 6 hours as needed for pain - Signed Removed medication of VICODIN 5-500 MG TABS (HYDROCODONE-ACETAMINOPHEN) 1 tab every 4-6 hrs as needed for pain (prescribed by ED doctor 11/24/09) Removed medication of DOXYCYCLINE HYCLATE 100 MG TABS (DOXYCYCLINE HYCLATE) take 1 tablet twice daily for 14 days Added new medication of MS CONTIN 30 MG XR12H-TAB (MORPHINE SULFATE) 1 tablet every 8 hours for pain for 1 week and then taper down to twice daily for one week, then once to twice daily for one more week then discontinue. - Signed Rx of NORCO 10-325 MG TABS (HYDROCODONE-ACETAMINOPHEN) take 1-2 tablets every 6 hours as needed for pain;  #90 x 0;  Signed;  Entered by: Vertell Limber MD;  Authorized by: Vertell Limber MD;  Method used: Print then Give to Patient Rx of MS CONTIN 30 MG XR12H-TAB (MORPHINE SULFATE) 1 tablet every 8 hours for pain for 1 week and then taper down to twice daily for one week, then once to twice daily for one more week then discontinue.;  #60 x 0;  Signed;  Entered by: Vertell Limber MD;  Authorized by: Vertell Limber MD;  Method used: Print then Give to Patient  Discharge medications:  NORCO 10-325 MG TABS (HYDROCODONE-ACETAMINOPHEN) take 1-2 tablets every 6 hours as needed for pain MS CONTIN 30 MG XR12H-TAB (MORPHINE SULFATE) 1 tablet every 8 hours for pain for 1 week and then taper down  to twice daily for one week, then once to twice daily for one more week then discontinue.  Other patient instructions:  Please come for an appointment at the outpatient clinic at [facility] on 6/2 at 2:10 pm with Dr. Philbert Riser  for a followup visit.  Please take your medication as prescribed below.  If you have any problem, Please call the clinic.   In case of an emergency  dial 911 or go to the emergency department.    Note: Hospital Discharge Medications & Other Instructions handout was printed, one copy for patient and a second copy to be placed in hospital chart.  Prescriptions: MS CONTIN 30 MG XR12H-TAB (MORPHINE SULFATE) 1 tablet every 8 hours for pain for 1 week and then taper down to twice daily for one week, then once to twice daily for one more week then discontinue.  #60 x 0   Entered and Authorized by:   Vertell Limber MD   Signed by:   Vertell Limber MD on 02/26/2010   Method used:   Print then Give to Patient   RxID:   [id number] NORCO 10-325 MG TABS (HYDROCODONE-ACETAMINOPHEN) take 1-2 tablets every 6 hours as needed for pain  #90 x 0   Entered and Authorized by:   Vertell Limber MD   Signed by:   Vertell Limber MD on 02/26/2010   Method used:   Print then Give to Patient   RxID:   [id number]
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
## 2010-11-05 LAB — CULTURE, BLOOD (ROUTINE X 2)
Culture  Setup Time: 201201260528
Culture: NO GROWTH
Culture: NO GROWTH

## 2010-11-06 NOTE — Initial Assessments (Signed)
INTERNAL MEDICINE ADMISSION HISTORY AND PHYSICAL  PCP: Rikki Spearing MD  CC: draining wound on left elbow and right neck  HPI:  Pt presents to the [facility] with a 5 days Hx of a painful "bump to left neck" and her left elbow. States that the Sx started after she was erroneously administered "a seizure medication" by an Therapist, sports at the assisted living where the patient resides. Patient ''squished" lesions which made them worse. Went to ED 2 days ago --was given doxycycline 100 mg Po two times a day  which the patient took for 2 days without any improvement in pain nor in drainage. Drainage is described as "yellowish, thick and milky" and without any malodor. At present she c/o 10/10 in intensity neck pain and elbow pain; worse with ROMl; subjective fever and chills lst night. Denies any diaphoresis, Dyshpagia, odynophagia, SOB, CP, abdominal pain, diarrhea or leg swelling. Of note, patient has a hx of hospitalization for a MRSA skin lesion infection in the past.  ALLERGIES: ! 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 Louanne Skye HTN - not currently requiring any medication Polysubstance abuse Depression   MEDICATIONS:  FLUOXETINE HCL 20 MG CAPS (FLUOXETINE HCL) Take 1 capsule by mouth once a day for depression VITAMIN B-12 1000 MCG TABS (CYANOCOBALAMIN) Take 1 tablet by mouth once a day BENADRYL 25 MG CAPS (DIPHENHYDRAMINE HCL) 1 tab every 6 hrs as needed for itching MULTIVITAMINS  CAPS (MULTIPLE VITAMIN) Take 1 tablet by mouth once a day TRAMADOL HCL 50 MG TABS (TRAMADOL HCL) Take 1 tablet by mouth every 6 hours as needed for pain DOXYCYCLINE HYCLATE 100 MG TABS (DOXYCYCLINE HYCLATE) 1 tablet by mouth two times a day x 4 days IBUPROFEN 400 MG TABS (IBUPROFEN) 1 tablet by mouth every 6 hours for pain   SOCIAL  HISTORY: Unemployed:  cleaning in past Living at [facility] prior crack/cocaine use - endorses last use 3 weeks ago tobacco:  1/2 ppd, actively trying to quit alcohol:  none   FAMILY HISTORY Colon cancer? in aunt breast cancer in mother   ROS: Please see HPI  VITALS: T:97.5  P:82  BP: 105/68  R:14  O2SAT:  ON:  PHYSICAL EXAM: Gen: Patient is in NAD, Pleasant, thin Eyes: PERRL, EOMI, No signs of anemia or jaundice. ENT: MMM, OP clear, No erythema, thrush or exudates. Neck: Supple, approximately 3x2cm excoriated lesion with central scab without frank drainage but exquisitely tender to touch, no lymphadenopathy, no palpable fluctuance Resp: CTA- Bilaterally, No W/C/R. CVS: RRR, No murmurs GI: Abdomen is soft. nt/nd  BS+.  Ext: No pedal edema, cyanosis or clubbing. There is a 1.5x1.5cm pink excoriated lesion with central scab without frank drainage that is exquisitely tender to touch Skin: patient with healing scabs of both ears. With areas of depigmentation on face without scab. Several smaller scabbed areas on underside of chin.  Well-healing scars on bilateral shins.  Lymph: No palpable lymphadenopathy. MS: Moving all 4 extremities. Neuro: A&O X3, CN II - XII are grossly intact. Motor strength is 5/5 in the all 4 extremities, Sensations intact to light touch, Gait normal. 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  Concern  for deep infection.  - CT neck and elbow to evaluate for deep infection/osteomyelitis; if CT is negative will consider discharge home on oral abx for 10-14 days - start vancomycin - BCx before antibiotics - wound culture for gram stain, C+S - CBC with differential, C-Met - ibuprofen and tramadol as needed pain  () HTN: normotensive, not currently on any meds  () DEPRESSION/ANXIETY: - continue home prozac  () VTE  PROPH: heparin  () polysubstance abuse: h/o cocaine and tobacco abuse. Pt states she is currently trying to quit smoking cigarettes, recently down to 1-2 cigarettes/day. Endorses last cocaine use 3 weeks ago. - UDS - SW consult for polysubstance abuse  () Dispo:  - admit to regular bed - SW consult for return to [facility] on d/c

## 2010-11-06 NOTE — Discharge Summary (Signed)
Summary: Hospital Discharge Update    Hospital Discharge Update:  Date of Admission: 09/15/2010 Date of Discharge: 09/17/2010  Brief Summary:  Patient was admitted with right neck cellulitis concerning for an abscess. She also had left albow tenderness and a an ulcer just over the joint which was concerning for septic artritis. The MRI of left elbow showed effusion and the aspirate was not consistent with intective etiology. It was decided to send her homw on prednisone therapy with Dr Stann Mainland. The CT neck was negative for any deep tissue infection.  Lab or other results pending at discharge:  none  Other labs needed at follow-up: none  Other follow-up issues:  Compliance with prednisone and evaluation of her lesions for any deterioration  Problem list changes:  Added new problem of ARTHRITIS (ICD-716.90)  Medication list changes:  Changed medication from PREDNISONE 10 MG TABS (PREDNISONE) take 4 tablets a day x 2 days, then 2 tablets x 2 days, then 1 tablet x 2 days, then stop to PREDNISONE 10 MG TABS (PREDNISONE) take 4 tablets a day x 4 days, then 2 tablets x 4 days, then 1 tablet x 6 days, then stop - Signed Added new medication of DOXYCYCLINE HYCLATE 100 MG CAPS (DOXYCYCLINE HYCLATE) Take 1 tablet by mouth two times a day - Signed Rx of PREDNISONE 10 MG TABS (PREDNISONE) take 4 tablets a day x 4 days, then 2 tablets x 4 days, then 1 tablet x 6 days, then stop;  #30 x 0;  Signed;  Entered by: Janell Quiet MD;  Authorized by: Janell Quiet MD;  Method used: Handwritten Rx of DOXYCYCLINE HYCLATE 100 MG CAPS (DOXYCYCLINE HYCLATE) Take 1 tablet by mouth two times a day;  #14 x 0;  Signed;  Entered by: Janell Quiet MD;  Authorized by: Janell Quiet MD;  Method used: Handwritten  The medication, problem, and allergy lists have been updated.  Please see the dictated discharge summary for details.  Discharge medications:  FLUOXETINE HCL 20 MG CAPS (FLUOXETINE HCL) Take 1 capsule by mouth once a day  for depression VITAMIN B-12 1000 MCG TABS (CYANOCOBALAMIN) Take 1 tablet by mouth once a day BENADRYL 25 MG CAPS (DIPHENHYDRAMINE HCL) 1 tab every 6 hrs as needed for itching MULTIVITAMINS  CAPS (MULTIPLE VITAMIN) Take 1 tablet by mouth once a day TRAMADOL HCL 50 MG TABS (TRAMADOL HCL) Take 1 tablet by mouth every 6 hours as needed for pain PREDNISONE 10 MG TABS (PREDNISONE) take 4 tablets a day x 4 days, then 2 tablets x 4 days, then 1 tablet x 6 days, then stop DOXYCYCLINE HYCLATE 100 MG TABS (DOXYCYCLINE HYCLATE) 1 tablet by mouth two times a day x 4 days IBUPROFEN 400 MG TABS (IBUPROFEN) 1 tablet by mouth every 6 hours for pain DOXYCYCLINE HYCLATE 100 MG CAPS (DOXYCYCLINE HYCLATE) Take 1 tablet by mouth two times a day  Other patient instructions:  Follow up with Dr Shon Baton on Sep 18 2010 in [facility] at 4 PM.. Please take your meds as prescribed. Please stay way from cocaine.  Note: Hospital Discharge Medications & Other Instructions handout was printed, one copy for patient and a second copy to be placed in hospital chart.

## 2010-11-06 NOTE — Medication Information (Signed)
Summary: PERCOCET  PERCOCET   Imported By: Garlan Fillers 10/14/2010 14:05:42  _____________________________________________________________________  External Attachment:    Type:   Image     Comment:   External Document

## 2010-11-06 NOTE — Assessment & Plan Note (Signed)
Summary: PER DR. BOWERS/ER/FU/SB.   Vital Signs:  Patient profile:   50 year old female Menstrual status:  postmenopausal Height:      61.5 inches Weight:      106.3 pounds BMI:     19.83 Temp:     98.2 degrees F oral Pulse rate:   74 / minute BP sitting:   146 / 77  (right arm)  Vitals Entered By: Silverio Decamp NT II (October 08, 2010 3:16 PM) CC: chest  pain and side pain/ knot on head, Depression Is Patient Diabetic? No Pain Assessment Patient in pain? yes     Location: chest and side pain Intensity: 10 Type: aching Onset of pain  started yesterday  Does patient need assistance? Functional Status Self care   Primary Care Provider:  Rikki Spearing, MD  CC:  chest  pain and side pain/ knot on head and Depression.  History of Present Illness: 50 y/o woman, resident of [facility] assisted living facility with PMH significant for cocaine abuse, levamisole induced vasculitis comes to the clinic as an ER follow up.  She was seen in the ER(10/06/10) for sore throat , rhinorrhea and ear pain on Jan 2nd when she took some cocaine on the New year.  She developed 2 new lesions on her left ear around 3 days ago, which are very small, blood tinged lesions associated with pain. This is most likely a reaction to her recent cocaine.  Also today, she complains of severe right sided chest pain, 10/10 in severity, throbbing in nature, radiating to her right abdomen, worsens with coughing. But denies palpitations, SOB, N/V/fever.  Depression History:      The patient denies a depressed mood most of the day and a diminished interest in her usual daily activities.         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 Take 1 Capsule By Mouth Once A Day For Depression 2)  Vitamin B-12 1000 Mcg Tabs (Cyanocobalamin) .... Take 1 Tablet By Mouth Once A Day 3)  Benadryl 25 Mg Caps (Diphenhydramine Hcl) .Marland Kitchen.. 1 Tab Every 6 Hrs As Needed For Itching 4)  Multivitamins  Caps (Multiple Vitamin) .... Take 1 Tablet By Mouth Once A Day 5)  Tramadol Hcl 50 Mg Tabs (Tramadol Hcl) .... Take 1 Tablet By Mouth Every 6 Hours As Needed For Pain 6)  Prednisone 10 Mg Tabs (Prednisone) .... Take 4 Tablets A Day X 4 Days, Then 2 Tablets X 4 Days, Then 1 Tablet X 6 Days, Then Stop  7)  Ibuprofen 400 Mg Tabs (Ibuprofen) .Marland Kitchen.. 1 Tablet By Mouth Every 6 Hours For Pain  Current Medications (verified): 1)  Fluoxetine Hcl 20 Mg Caps (Fluoxetine Hcl) .... Take 1 Capsule By Mouth Once A Day For Depression 2)  Vitamin B-12 1000 Mcg Tabs (Cyanocobalamin) .... Take 1 Tablet By Mouth Once A Day 3)  Benadryl 25 Mg Caps (Diphenhydramine Hcl) .Marland Kitchen.. 1 Tab Every 6 Hrs As Needed For Itching 4)  Multivitamins  Caps (Multiple Vitamin) .... Take 1 Tablet By Mouth Once A Day 5)  Tramadol Hcl 50 Mg Tabs (Tramadol Hcl) .... Take 1 Tablet By Mouth Every 6 Hours As Needed For Pain 6)  Prednisone 10 Mg Tabs (Prednisone) .... Take 4 Tablets A Day X 4 Days, Then 2 Tablets X 4 Days, Then 1 Tablet X 6 Days, Then Stop 7)  Ibuprofen 400 Mg Tabs  (Ibuprofen) .Marland Kitchen.. 1 Tablet By Mouth Every 6 Hours For Pain 8)  Oxycodone Hcl 10 Mg Tabs (Oxycodone Hcl) .... Take 1 Tab Every 6 Hours As Needed For Pain 9)  Guaifenesin 100 Mg/34m Syrp (Guaifenesin) .... Take 1 Teaspoon Very 6 Hours As Neede For Cough  Allergies (verified): 1)  ! 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 NLouanne SkyeHTN - not currently requiring any medication Polysubstance abuse Depression  Past Surgical History: Last updated: 05/08/2010 7/11 - biopsies of bilateral shin nodules  Family History: Last updated: 05/08/2010 Colon cancer? in aunt breast cancer in mother  Social History: Last updated: 09/18/2010 Unemployed:  [place]ing [place] [facility] [place] [place] last use 3 weeks [place]; pt was UDS negative for cocaine during last hospital admission tobacco:  1/2 ppd alcohol:  none  Risk Factors: Alcohol Use: 0 (10/08/2010) Exercise: no (10/08/2010)  Risk Factors: Smoking Status: current (10/08/2010) Packs/Day: 2 cgs per day (10/08/2010)  Review of Systems       The patient complains of chest pain.  The patient denies anorexia, fever, decreased hearing, dyspnea on exertion, peripheral edema, headaches, hemoptysis, abdominal pain, melena, and hematochezia.    Physical Exam  General:  alert, well-developed, well-nourished, and well-hydrated.   Head:  normocephalic, atraumatic, no abnormalities observed, and no abnormalities palpated.   Eyes:  vision grossly intact, pupils equal, pupils round, and pupils reactive to light.   Ears:  L ear has two new lesion on the pinna , very small blood tinged about 1 cm in diameter, R ear lesions are healing well Mouth:  pharynx pink and moist.   Neck:  supple and full ROM.   Chest Wall:  R chest  tender to palpation, but no masses or deformities Lungs:  normal respiratory effort, no intercostal retractions, no accessory muscle use, normal breath sounds, no dullness, no fremitus, no crackles, and no wheezes.   Heart:  normal rate, regular rhythm, no murmur, no gallop, no rub, and no JVD.   Abdomen:  soft, non-tender, normal bowel sounds, no distention, no masses, no guarding, no rigidity, and no rebound tenderness.   Msk:  normal ROM, no joint tenderness, no joint swelling, no joint warmth, no redness over joints, and no joint deformities.   Pulses:  2+pulses b/l. Extremities:  no cyanosis, clubbing or edema. Neurologic:  alert & oriented X3, cranial nerves II-XII  intact, strength normal in all extremities, sensation intact to light touch, sensation intact to pinprick, gait normal, and DTRs symmetrical and normal.     Impression & Recommendations:  Problem # 1:  VASCULITIS (ICD-447.6) Assessment Comment Only She got 2 new lesions on her L ear but they are vey small, about 1 cm in diameter, blood tinged associated with some pain. Her lesions on the R ear appear to be healing well. I was in the same team when  Dr. Valetta Close was taking care of her during her hospitalization in September and usually her wounds heal pretty quickly with some wound care. Will not give her any steroids for her new skin lesions given their appearence , severity,  past experience and also taking into conideration the side effects from steroids. Will give her some oxycodone for her pain associated with these skin lesions.  Problem # 2:  CHEST PAIN (ICD-786.50) Assessment: Comment Only Her chest pain is located on the right side and  reports worsening of chest pain with coughing. On exam the chest pain was reproducible on palpation and lungs were clear o auscultation b/l.. This is most likely muskuloskeletal in origin. Other DD include cocaine related vs pleuritic vs  GERD which are less likely as she was extremely tender  to palpation on exam.  She was adviced to take some ibuprofen due to its anti-inflammatory effects and Oxycodone that was given for her skin lesions will also help her with the  chest pain.  She was given some guafenesin for her cough.  Complete Medication List: 1)  Fluoxetine Hcl 20 Mg Caps (Fluoxetine hcl) .... Take 1 capsule by mouth once a day for depression 2)  Vitamin B-12 1000 Mcg Tabs (Cyanocobalamin) .... Take 1 tablet by mouth once a day 3)  Benadryl 25 Mg Caps (Diphenhydramine hcl) .Marland Kitchen.. 1 tab every 6 hrs as needed for itching 4)  Multivitamins Caps (Multiple vitamin) .... Take 1 tablet by mouth once a day 5)  Tramadol Hcl 50 Mg Tabs (Tramadol hcl) .... Take 1 tablet by mouth every 6 hours as needed for pain 6)  Ibuprofen 400 Mg Tabs (Ibuprofen) .Marland Kitchen.. 1 tablet by mouth every 6 hours for pain 7)  Oxycodone Hcl 10 Mg Tabs (Oxycodone hcl) .... Take 1 tab every 6 hours as needed for pain 8)  Guaifenesin 100 Mg/42m Syrp (Guaifenesin) .... Take 1 teaspoon very 6 hours as neede for cough  Patient Instructions: 1)  Please take your medicines as prescribed. 2)  Please call the clinic if your ear wounds get worse or are not healing well. 3)  Please schedule a follow-up appointment as needed. Prescriptions: GUAIFENESIN 100 MG/5ML SYRP (GUAIFENESIN) Take 1 teaspoon very 6 hours as neede for cough  #1 bottle x 0   Entered and Authorized by:   MPedro Earls  Signed by:   MPedro Earlson 10/10/2010   Method used:   Print then Give to Patient   RxID::   [id number] HCL 10 MG TABS (OXYCODONE HCL) Take 1 tab every 6 hours as needed for pain  #40 x 0   Entered and Authorized by:   MPedro Earls  Signed by:   MPedro Earlson 10/10/2010   Method used:   Print then Give to Patient   RxID:   [id number]   Orders Added: 1)  Est. Patient Level IV [[37858]     Appended Document: PER DR. BOWERS/ER/FU/SB.    Clinical Lists Changes  Medications: Rx of  OXYCODONE HCL 10  MG TABS (OXYCODONE HCL) Take 1 tab every 6 hours as needed for pain;  #40 x 0;  Signed;  Entered by: Pedro Earls;  Authorized by: Pedro Earls;  Method used: Printed then faxed to  Rx of GUAIFENESIN 100 MG/5ML SYRP (GUAIFENESIN) Take 1 teaspoon very 6 hours as neede for cough;  #1 bottle x 0;  Signed;  Entered by: Pedro Earls;  Authorized by: Pedro Earls;  Method used: Printed then faxed to    Prescriptions: GUAIFENESIN 100 MG/5ML SYRP (GUAIFENESIN) Take 1 teaspoon very 6 hours as neede for cough  #1 bottle x 0   Entered and Authorized by:   Pedro Earls   Signed by:   Pedro Earls on 10/10/2010   Method used:   Printed then faxed to ...         RxID:   [id number] OXYCODONE HCL 10 MG TABS (OXYCODONE HCL) Take 1 tab every 6 hours as needed for pain  #40 x 0   Entered and Authorized by:   Pedro Earls   Signed by:   Pedro Earls on 10/10/2010   Method used:   Printed then faxed to ...         RxID:   [id number]

## 2010-11-06 NOTE — Assessment & Plan Note (Signed)
Summary: HFU-PER DR HO/CFB   Vital Signs:  Patient profile:   50 year old female Menstrual status:  postmenopausal Height:      61.5 inches (156.21 cm) Weight:      104.8 pounds (47.64 kg) BMI:     19.55 Temp:     96.9 degrees F (36.06 degrees C) oral Pulse rate:   99 / minute BP sitting:   126 / 69  (left arm)  Vitals Entered By: Hilda Blades Ditzler RN (September 18, 2010 3:40 PM) CC: needs a Pap smear Is Patient Diabetic? No Pain Assessment Patient in pain? no      Nutritional Status BMI of < 19 = underweight Nutritional Status Detail appetite good  Have you ever been in a relationship where you felt threatened, hurt or afraid?denies   Does patient need assistance? Functional Status Self care Ambulation Normal Comments Pap - LMP age 78.   Visit Type:  check-up/hospital follow-up Primary Care Taegan Haider:  Rikki Spearing, MD  CC:  needs a Pap smear.  History of Present Illness: Patient presents after hospital discharge yesterday for treatment of 2 new skin lesions.  They appear to be healing well.  She is on a steroid taper.  She states she feels very good and has no complaints. Would  like a Pap smear today.  She states it has been several years since her last sexual encounter and approximately 4-5 years since her last pap smear. She denies any menstrual complaints.  She states her mood has been good: "I feel great!"  Depression History:      The patient denies a depressed mood most of the day and a diminished interest in her usual daily activities.         Preventive Screening-Counseling & Management  Alcohol-Tobacco     Alcohol drinks/day: 0     Smoking Status: current     Smoking Cessation Counseling: yes     Packs/Day: 2 cgs per day     Year Started: 1980'S  Caffeine-Diet-Exercise     Does Patient Exercise: no     Type of exercise: PT     Times/week: 30  Current Medications (verified): 1)  Fluoxetine Hcl 20 Mg Caps (Fluoxetine Hcl) .... Take 1 Capsule By Mouth  Once A Day For Depression 2)  Vitamin B-12 1000 Mcg Tabs (Cyanocobalamin) .... Take 1 Tablet By Mouth Once A Day 3)  Benadryl 25 Mg Caps (Diphenhydramine Hcl) .Marland Kitchen.. 1 Tab Every 6 Hrs As Needed For Itching 4)  Multivitamins  Caps (Multiple Vitamin) .... Take 1 Tablet By Mouth Once A Day 5)  Tramadol Hcl 50 Mg Tabs (Tramadol Hcl) .... Take 1 Tablet By Mouth Every 6 Hours As Needed For Pain 6)  Prednisone 10 Mg Tabs (Prednisone) .... Take 4 Tablets A Day X 4 Days, Then 2 Tablets X 4 Days, Then 1 Tablet X 6 Days, Then Stop 7)  Ibuprofen 400 Mg Tabs (Ibuprofen) .Marland Kitchen.. 1 Tablet By Mouth Every 6 Hours For Pain  Allergies: 1)  ! Tylenol  Past History:  Past medical, surgical, family and social histories (including risk factors) reviewed, and no changes noted (except as noted below).  Past Medical History: Reviewed history from 05/08/2010 and no changes required. 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 Louanne Skye HTN - not currently requiring any medication Polysubstance abuse Depression  Past Surgical History: Reviewed history from 05/08/2010 and no changes required. 7/11 - biopsies of bilateral shin nodules  Family History: Reviewed history from 05/08/2010 and no changes required. Colon cancer? in aunt breast cancer in mother  Social History: Reviewed history from 03/06/2010 and no changes required. Unemployed:  cleaning in past Living at [place] crack/cocaine use - last use 3 weeks ago; pt was UDS negative for cocaine during last hospital admission tobacco:  1/2 ppd alcohol:  none  Review of Systems       See HPI  Physical Exam  General:  alert and well-developed.   Mouth:  pharynx pink and moist.   Neck:  supple.  Left neck 2x2 cm non-draining lesion with decreased tenderness as compared to during hospitalization and no underlyng  fluctuance. Lungs:  normal respiratory effort, normal breath sounds, no crackles, and no wheezes.   Heart:  normal rate, regular rhythm, no murmur, no gallop, and no rub.   Abdomen:  soft, non-tender, normal bowel sounds, no distention, and no masses.   Msk:  Some pain with extension and flexion of left elbow with less-tender 1cm non-draining lesion (improved as compared to during admission) Pulses:  2+ bilateral pedal pulses Neurologic:  alert & oriented X3, strength normal in all extremities, and gait normal.   Psych:  Oriented X3, normally interactive, good eye contact, and not anxious appearing.    Pelvic Exam  Vulva:      normal appearance, normal hair distribution, no lesions or masses.   Vagina:      normal.   Cervix:      There is a small  ~38m smooth, round polypoid lesion at the cervical os that bleeds when touched with the pap smear brush. No irregular border, discoloration.  Uterus:      non-tender.   Adnexa:      unable to be palpated bilaterally    Impression & Recommendations:  Problem # 1:  SUBSTANCE ABUSE, MULTIPLE (ICD-305.90) Assessment Improved Patient states last use of cocaine was 3 weeks ago.  Congratulated her on this!  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  I believe that some of her symptoms can be explained by her levimasole-induced vasculitis, however, given that she has not used in several weeks and she has such a variety of symptoms, it is very possible that she also has an underlying rheumatologic problem which cannot really be unmasked until she can go several months without cocaine.  [facility] and they would like for her to return in April for a follow-up visit.  Hopefullly she can remain clean until then.  I will see her back in 2 months to check on her progress.  Problem # 3:  Preventive  Health Care (ICD-V70.0) Assessment: Comment Only Pap smear with cultures/wet prep completed today.  There is a small lesion at the cervical os that is likely a cervical polyp.  Will follow results of Pap closely.  Next due in December 2012.  Orders: T-Wet Prep by Molecular Probe ([phone] T-PAP ([facility] [phone] T-Chlamydia & GC Probe, Genital ([id number])  Complete Medication List: 1)  Fluoxetine Hcl 20 Mg Caps (Fluoxetine hcl) .... Take 1 capsule by mouth once a day for depression 2)  Vitamin B-12 1000 Mcg Tabs (Cyanocobalamin) .... Take 1 tablet by mouth once a day 3)  Benadryl 25 Mg Caps (Diphenhydramine hcl) ..Marland KitchenMarland KitchenMarland Kitchen  1 tab every 6 hrs as needed for itching 4)  Multivitamins Caps (Multiple vitamin) .... Take 1 tablet by mouth once a day 5)  Tramadol Hcl 50 Mg Tabs (Tramadol hcl) .... Take 1 tablet by mouth every 6 hours as needed for pain 6)  Prednisone 10 Mg Tabs (Prednisone) .... Take 4 tablets a day x 4 days, then 2 tablets x 4 days, then 1 tablet x 6 days, then stop 7)  Ibuprofen 400 Mg Tabs (Ibuprofen) .Marland Kitchen.. 1 tablet by mouth every 6 hours for pain  Patient Instructions: 1)  Continue to take your medicines as directed, and please take the entire steroid taper. 2)  I will call you if any of the results of your testing are abnormal. 3)  Please follow-up with me in 2 months.   Orders Added: 1)  T-Wet Prep by Molecular Probe [[id number]] 2)  T-PAP [facility]) [[id number]] 3)  T-Chlamydia & GC Probe, Genital [[id number]] 4)  Est. Patient Level III [51700]   Process Orders Check Orders Results:     [facility]: ABN not required for this insurance Tests Sent for requisitioning (September 18, 2010 5:28 PM):     09/18/2010: [facility] -- T-Wet Prep by Molecular Probe [phone] (signed)     09/18/2010: [facility] -- T-Chlamydia & GC Probe, Genital [[id number]] (signed)     Prevention & Chronic  Care Immunizations   Influenza vaccine: Fluvax MCR  (09/15/2010)   Influenza vaccine due: 06/06/2011    Tetanus booster: 09/15/2010: Tdap   Tetanus booster due: 09/15/2020    Pneumococcal vaccine: Pneumovax  (09/15/2010)   Pneumococcal vaccine due: 09/16/2015  Other Screening   Pap smear: Not documented   Pap smear action/deferral: Deferred  (05/08/2010)   Pap smear due: 09/15/2010    Mammogram: ASSESSMENT: Negative - BI-RADS 1^MM DIGITAL SCREENING  (09/10/2010)   Mammogram action/deferral: Deferred  (05/08/2010)   Mammogram due: 09/11/2011   Smoking status: current  (09/18/2010)   Smoking cessation counseling: yes  (09/18/2010)  Lipids   Total Cholesterol: Not documented   Lipid panel action/deferral: Lipid Panel ordered   LDL: Not documented   LDL Direct: Not documented   HDL: Not documented   Triglycerides: Not documented   Lipid panel due: 09/16/2011  Hypertension   Last Blood Pressure: 126 / 69  (09/18/2010)   Serum creatinine: Not documented   BMP action: Ordered   Serum potassium Not documented   Basic metabolic panel due: 91/63/8466  Self-Management Support :   Personal Goals (by the next clinic visit) :      Personal blood pressure goal: 140/90  (03/06/2010)   Patient will work on the following items until the next clinic visit to reach self-care goals:     Medications and monitoring: take my medicines every day, check my blood pressure, bring all of my medications to every visit, weigh myself weekly  (09/18/2010)     Eating: eat more vegetables, use fresh or frozen vegetables, eat fruit for snacks and desserts  (09/18/2010)     Activity: take a 30 minute walk every day  (09/18/2010)    Hypertension self-management support: Written self-care plan, Education handout  (09/18/2010)   Hypertension self-care plan printed.   Hypertension education handout printed  Process Orders Check Orders Results:     [facility]: ABN not required for this  insurance Tests Sent for requisitioning (September 18, 2010 5:28 PM):     09/18/2010: [facility] -- T-Wet Prep by Molecular Probe [  [id number]] (signed)     09/18/2010: [facility] -- T-Chlamydia & [place], Genital [[id number]] (signed)

## 2010-11-06 NOTE — Assessment & Plan Note (Signed)
Summary: ACUTE-2 DAY ER/FU SKIN INFECTION/CFB   Vital Signs:  Patient profile:   50 year old female Menstrual status:  postmenopausal Height:      61.5 inches (156.21 cm) Weight:      106.8 pounds (48.55 kg) BMI:     19.92 Temp:     97.5 degrees F oral Pulse rate:   82 / minute BP sitting:   105 / 68  (right arm) Cuff size:   regular  Vitals Entered By: Morrison Old RN (September 15, 2010 2:02 PM) CC: ED f/u for skin infection. Is Patient Diabetic? No Pain Assessment Patient in pain? no      Nutritional Status BMI of 19 -24 = normal  Have you ever been in a relationship where you felt threatened, hurt or afraid?No   Does patient need assistance? Functional Status Self care Ambulation Normal     Menstrual Status postmenopausal   Primary Care Provider:  Rikki Spearing, MD  CC:  ED f/u for skin infection.Marland Kitchen  History of Present Illness: Pt presents with a 5 days Hx of a painful "bump to left neck" and her left elbow. States that the Sx started after she was erroneously administered "a seizure medication" by an Therapist, sports at the assisted living where the patient resides. Patient ''squished" lesions which made them worse. Went to ED 2 days ago --was given doxycycline 100 mg Po two times a day  which the patient took for 2 days without any improvement in pain nor in drainage. Drainage is described as "yellowish, thick and milky" and without any malodor. At present she c/o 10/10 in intensity neck pain and elbow pain; worse with ROMl; fever and chills lst night. Denies any diaphoresis, Dyshpagia, odynophagia, SOB, CP, abdominal pain, diarrhea or lef swelling. patient has a hx of hospitalization for a MRSA skin lesion infection in the past.  Depression History:      The patient denies a depressed mood most of the day and a diminished interest in her usual daily activities.         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 Take 1 Capsule By Mouth Once A Day For Depression 2)  Vitamin B-12 1000 Mcg Tabs (Cyanocobalamin) .... Take 1 Tablet By Mouth Once A Day 3)  Benadryl 25 Mg Caps (Diphenhydramine Hcl) .Marland Kitchen.. 1 Tab Every 6 Hrs As Needed For Itching 4)  Multivitamins  Caps (Multiple Vitamin) .... Take 1 Tablet By Mouth Once A Day 5)  Tramadol Hcl 50 Mg Tabs (Tramadol Hcl) .... Take 1 Tablet By Mouth Every 6 Hours As Needed For Pain 6)  Prednisone 10 Mg Tabs (Prednisone) .... Take 4 Tablets A Day X 2 Days, Then 2 Tablets X 2  Days, Then 1 Tablet X 2 Days, Then Stop 7)  Doxycycline Hyclate 100 Mg Tabs (Doxycycline Hyclate) .Marland Kitchen.. 1 Tablet By Mouth Two Times A Day X 4 Days 8)  Ibuprofen 400 Mg Tabs (Ibuprofen) .Marland Kitchen.. 1 Tablet By Mouth Every 6 Hours For Pain  Allergies (verified): 1)  ! Tylenol  Social History: Does Patient Exercise:  no Packs/Day:  2 cgs per day  Physical Exam  General:  alert and well-developed.   Head:  normocephalic.  Scars on cheeks from previous lesions.  Eyes:  vision grossly intact, pupils equal, pupils round, and pupils reactive to light.   Mouth:  pharynx pink and moist.   Neck:  supple.  unable to assess for massess due to an acute left ventral surface neck TTP; Left neck 2x2 cm draining lesion with some erythematous  enduration there is a question of an underlying fluctuant abscess. Lungs:  normal respiratory effort, normal breath sounds, no crackles, and no wheezes.   Heart:  normal rate, regular rhythm, no murmur, no gallop, and no rub.   Abdomen:  soft, non-tender, normal bowel sounds, no distention, and no masses.   Msk:  normal ROM.  left elbo without any effusion, erythema or increase in warmth with touch. Pain illicited with a ROM (worse with flexion). There is a 0.5 cm erythematout and draining lesion to medial aspect of the elbow close to olecranon Pulses:  2+ dp/pt pulses Extremities:  complete healing of prior left leg wound.  Neurologic:  cranial nerves II-XII intact.   Skin:  Per MSk and Neck exam above. Cervical Nodes:  No lymphadenopathy noted Psych:  Oriented X3, memory intact for recent and remote, normally interactive, not anxious appearing, and not depressed appearing.  Oriented X3, memory intact for recent and remote, normally interactive, not anxious appearing, and not depressed appearing.     Impression & Recommendations:  Problem # 1:  METHICILLIN RESISTANT STAPHYLOCOCCUS AUREUS INFECTION (ICD-041.12) Assessment Unchanged Left neck and left elbow ?cellulitis vs abscess. Case discussed with Dr. Marinda Elk. Will admit the patient and treat with Vanc IV. Will image neck and left elbow with a CT with Cm to eval for abscess/osteomyeltis. spoke with Dr. Eyvonne Mechanic ->notified of an admission. Preliminary hopital orders written.  Complete Medication List: 1)  Fluoxetine Hcl 20 Mg Caps (Fluoxetine hcl) .... Take 1 capsule by mouth once a day for depression 2)  Vitamin B-12 1000 Mcg Tabs (Cyanocobalamin) .... Take 1 tablet by mouth once a day 3)  Benadryl 25 Mg Caps (Diphenhydramine hcl) .Marland Kitchen.. 1 tab every 6 hrs as needed for itching 4)  Multivitamins Caps (Multiple vitamin) .... Take 1 tablet by mouth once a day 5)  Tramadol Hcl 50 Mg Tabs (Tramadol hcl) .... Take 1 tablet by mouth every 6 hours as  needed for pain 6)  Prednisone 10 Mg Tabs (Prednisone) .... Take 4 tablets a day x 2 days, then 2 tablets x 2 days, then 1 tablet x 2 days, then stop 7)  Doxycycline Hyclate 100 Mg Tabs (Doxycycline hyclate) .Marland Kitchen.. 1 tablet by mouth two times a day x 4 days 8)  Ibuprofen 400 Mg Tabs (Ibuprofen) .Marland Kitchen.. 1 tablet by mouth every 6 hours for pain  Other Orders: T-Lipid Profile ([id number]) T-Basic Metabolic Panel ([id number]) Tdap => 64yr IM ((22297 Pneumococcal Vaccine ((98921 Admin 1st Vaccine ((19417 Admin of Any Addtl Vaccine ((40814 Influenza Vaccine MCR ((48185  Patient Instructions: 1)  Please, take all your medications as instructed. 2)  Please, make an appointment for a well  woman exam. 3)  Please, call with any questions. 4)  Please, follow up in 63month or sooner.   Orders Added: 1)  T-Lipid Profile [[id number]] 2)  T-Basic Metabolic Panel [[[id number]]3)  Tdap => 761yrIM [90715] 4)  Pneumococcal Vaccine [90732] 5)  Admin 1st Vaccine [90471] 6)  Admin of Any Addtl Vaccine [90472] 7)  Influenza Vaccine MCR [00025] 8)  Est. Patient Level III [[id number][[id number]] Immunizations Administered:  Tetanus Vaccine:    Vaccine Type: Tdap    Site: right deltoid    Mfr: GlaxoSmithKline    Dose: 0.5 ml    Route: IM    Given by: GlMorrison OldN    Exp. Date: 07/24/2012    Lot #: [id number]  VIS given: 08/22/08 version given September 15, 2010.  Pneumonia Vaccine:    Vaccine Type: Pneumovax    Site: left deltoid    Mfr: Merck    Dose: 0.5 ml    Route: IM    Given by: GlMorrison OldN    Exp. Date: 11/22/2011    Lot #: [id number]  VIS given: 09/09/09 version given September 15, 2010.  Flu Vaccine Consent Questions:    Do you have a history of severe allergic reactions to this vaccine? no    Any prior history of allergic reactions to egg and/or gelatin? no    Do you have a sensitivity to the preservative Thimersol? no    Do you have a past history of Guillan-Barre Syndrome? no     Do you currently have an acute febrile illness? no    Have you ever had a severe reaction to latex? no    Vaccine information given and explained to patient? yes    Are you currently pregnant? no   Immunizations Administered:  Tetanus Vaccine:    Vaccine Type: Tdap    Site: right deltoid    Mfr: GlaxoSmithKline    Dose: 0.5 ml    Route: IM    Given by: GlMorrison OldN    Exp. Date: 07/24/2012    Lot #: [id number]  VIS given: 08/22/08 version given September 15, 2010.  Pneumonia Vaccine:    Vaccine Type: Pneumovax    Site: left deltoid    Mfr: Merck    Dose: 0.5 ml    Route: IM    Given by: GlMorrison OldN    Exp. Date: 11/22/2011    Lot #: [id number]  VIS given: 09/09/09 version given September 15, 2010.  Influenza Vaccine (to be given today)  Prevention & Chronic Care Immunizations   Influenza vaccine: Fluvax MCR  (09/15/2010)   Influenza vaccine due: 06/06/2011    Tetanus booster: 09/15/2010: Tdap   Tetanus booster due: 09/15/2020    Pneumococcal vaccine: Pneumovax  (09/15/2010)   Pneumococcal vaccine due: 09/16/2015  Other Screening   Pap smear: Not documented   Pap smear action/deferral: Deferred  (05/08/2010)   Pap smear due: 09/15/2010    Mammogram: ASSESSMENT: Negative - BI-RADS 1^MM DIGITAL SCREENING  (09/10/2010)   Mammogram action/deferral: Deferred  (05/08/2010)   Mammogram due: 09/11/2011   Smoking status: current  (09/15/2010)   Smoking cessation counseling: yes  (09/15/2010)  Lipids   Total Cholesterol: Not documented   Lipid panel action/deferral: Lipid Panel ordered   LDL: Not documented   LDL Direct: Not documented   HDL: Not documented   Triglycerides: Not documented   Lipid panel due: 09/16/2011  Hypertension   Last Blood  Pressure: 105 / 68  (09/15/2010)   Serum creatinine: Not documented   BMP action: Ordered   Serum potassium Not documented   Basic metabolic panel due: 57/97/2820    Hypertension flowsheet reviewed?: Yes    Progress toward BP goal: At goal    Stage of readiness to change (hypertension management): Maintenance  Self-Management Support :   Personal Goals (by the next clinic visit) :      Personal blood pressure goal: 140/90  (03/06/2010)   Patient will work on the following items until the next clinic visit to reach self-care goals:     Medications and monitoring: take my medicines every day, check my blood pressure, bring all of my medications to every visit  (09/15/2010)     Eating: eat foods that are low in salt, eat baked foods instead of fried foods  (09/15/2010)     Activity: take a 30 minute walk every day  (09/15/2010)    Hypertension self-management support: Education handout, Written self-care plan  (09/15/2010)   Hypertension self-care plan printed.   Hypertension education handout printed   Nursing Instructions: Give Flu vaccine today Give tetanus booster today Give Pneumovax today   Process Orders Check Orders Results:     [facility]: ABN not required for this insurance Tests Sent for requisitioning (September 15, 2010 2:45 PM):     09/15/2010: [facility] -- T-Lipid Profile [phone] (signed)     09/15/2010: [facility] -- T-Basic Metabolic Panel [[id number]] (signed)      Appended Document: ACUTE-2 DAY ER/FU SKIN INFECTION/CFB   Influenza Vaccine    Vaccine Type: Fluvax MCR    Site: right deltoid    Mfr: GlaxoSmithKline    Dose: 0.5 ml    Route: IM    Given by: Morrison Old RN    Exp. Date: 03/28/2011    Lot #: [id number]    VIS given: 04/29/10 version given September 15, 2010.

## 2010-11-06 NOTE — Progress Notes (Signed)
Summary: phone/gg  Phone Note Call from Patient   Summary of Call: Pt seen in ED 1/8 and 1/10 . On 1/8 seen for  pneumonia and was given oxycodone  5 mg q 8 hours.  also seen on 1/10 for allergic reaction, given prednisone and benadryl Pt  normally is takes  oxycodone 10 mg q 6 hours for leg pain.  [facility] was not sure which pain med to give,  oxycodone 5 mg or 10 mg. [facility] # [phone]  Should I suggest giving pt oxycodone 5 mg 2 tabs q 6 hours as needed so she will continue to get her normal dose? I was sure to tell them NOT to give both pain meds to pt Initial call taken by: Gevena Cotton RN,  October 14, 2010 12:51 PM  Follow-up for Phone Call        I think you have done the right thing.  Keep up the good work. Follow-up by: Milta Deiters MD,  October 14, 2010 2:06 PM  Additional Follow-up for Phone Call Additional follow up Details #1::        above instructions were written and faxed to [facility] Additional Follow-up by: Gevena Cotton RN,  October 14, 2010 2:34 PM

## 2010-11-06 NOTE — Initial Assessments (Signed)
INTERNAL MEDICINE ADMISSION HISTORY AND PHYSICAL  PCP:  Rikki Spearing MD  CC: right hand and knee swelling with pain  HPI:  Patient is a 50 y/o woman with h/o levimasole-induced vasculitis and cocaine abuse who presents with 1 day history of right hand and knee swelling and pain.  She describes the pain as severe, 10/10 and present whenver these areas are moved or touched.  She is unable to close her right secondary to pain and swelling and she is unable to move her right knee much because of the pain.  Patient has also noticed erythematous swelling of forehead, pustular lesions on her lower eyelids, and open sores on her left buttock and gluteal crease.  These are all painful.  Patient denies any fevers but endorses chills. She states she has not used any cocaine since prior to her last admission.  She has been eating well, though not today as she has felt nauseous and vomited once.  She endorses some vague lower sternal chest pain that she states feels like a "knot is going to come up" similar to her other swelling. She endorses some shortness of breath when these painful areas on her body are touched or moved.   She denies any abdominal pain. No dysuria, no change in bowel movements.  No vision changes.  ALLERGIES: ! 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 Louanne Skye HTN - not currently requiring any medication Polysubstance abuse - cocaine, tobacco Depression   MEDICATIONS: FLUOXETINE HCL 20 MG CAPS (FLUOXETINE HCL) Take 1 capsule by mouth once a day for depression VITAMIN B-12 1000 MCG TABS (CYANOCOBALAMIN) Take 1 tablet by mouth once a day BENADRYL 25 MG CAPS (DIPHENHYDRAMINE HCL) 1 tab every 6 hrs as needed for itching MULTIVITAMINS  CAPS (MULTIPLE VITAMIN) Take 1 tablet by mouth once a day TRAMADOL  HCL 50 MG TABS (TRAMADOL HCL) Take 1 tablet by mouth every 6 hours as needed for pain IBUPROFEN 400 MG TABS (IBUPROFEN) 1 tablet by mouth every 6 hours for pain GUAIFENESIN 100 MG/5ML SYRP (GUAIFENESIN) Take 1 teaspoon very 6 hours as neede for cough oxycodone 5-10 mg by mouth q4h as needed pain   SOCIAL HISTORY: Unemployed:  cleaning in past Living at [place]; has Medicaid. crack/cocaine use - demies no cocaine use for several weeks; pt was UDS negative for cocaine during last hospital admission; pt denies IVDU tobacco:  1/2 ppd, trying to quit alcohol:  none   FAMILY HISTORY Colon cancer? in aunt breast cancer in mother   ROS:  Otherwise negative, see HPI  VITALS: T:97.8  P:116  BP:161/101  R:24  O2SAT:100%  ON:RA  PHYSICAL EXAM: Gen: Patient is tearful and appears in pain, cooperative. Eyes: PERRL, EOMI, conjunctiva is mildly injected, there are several 3-11m pustular, inflamed, tender lesions on her lower lids. Pt able to fully close and open her eyes. ENT: MMM without oral ulcers/lesions Neck: Supple, no lymphadenopathy Resp: CTA- Bilaterally, No W/C/R. CVS: tachycardic, regular rhythm, no murmur GI: Abdomen is soft. ND, NT, NG, NR, BS+. No organomegaly.  Ext: No pedal edema, cyanosis or clubbing.  There is tense, warm, exquisitely tender swelling of the right arm to the mid-humerus without pitting. Strong radial pulses bilaterally.  Finger tips are warm but cooler to touch than the rest  of the hand and arm.  Sensation intact.  Delayed capillary refill. Patient is unable to close her right fist because of the tense swelling and pain.  right knee and ankle is tender, erythematous, warm, and swollen with limited range of motion because of pain. 2+ distal pedal and posterior tibial pulses. GU: No CVA tenderness. Skin: see eye, extremity exam. Pt has 1cm diameter full-thickness ulcer at top of gluteal cleft with exposed granulation tissue without drainage.  Pt has 1cm diameter  partial-thickness ulcer on left buttock with pink granulation tissue without drainage.  Pt has numerous healing scars on her feet, ears, trunk and extremities from previous vasculitis flares. Lymph: There is palpable, tender, mobile right mid-axillary lymph node.  MS: Moving all 4 extremities. Neuro: A&O X3, CN II - XII are grossly intact. Motor strength is 5/5 in the all 4 extremities, Sensations intact to light touch Psych: A+Ox4; tearful but appropriate  LABS:  WBC                                      6.8               4.0-10.5         K/uL  RBC                                      5.04              3.87-5.11        MIL/uL  Hemoglobin (HGB)                         13.9              12.0-15.0        g/dL  Hematocrit (HCT)                         41.7              36.0-46.0        %  MCV                                      82.7              78.0-100.0       fL  MCH -                                    27.6              26.0-34.0        pg  MCHC                                     33.3              30.0-36.0        g/dL  RDW  15.9       h      11.5-15.5        %  Platelet Count (PLT)                     404        h      150-400          K/uL  Neutrophils, %                           79         h      43-77            %  Lymphocytes, %                           17                12-46            %  Monocytes, %                             3                 3-12             %  Eosinophils, %                           0                 0-5              %  Basophils, %                             0                 0-1              %  Neutrophils, Absolute                    5.4               1.7-7.7          K/uL  Lymphocytes, Absolute                    1.2               0.7-4.0          K/uL  Monocytes, Absolute                      0.2               0.1-1.0          K/uL  Eosinophils, Absolute                    0.0               0.0-0.7          K/uL   Basophils, Absolute                      0.0  0.0-0.1          K/uL   Sodium (NA)                              134        l      135-145          mEq/L  Potassium (K)                            5.1               3.5-5.1          mEq/L  Chloride                                 102               96-112           mEq/L  CO2                                      22                19-32            mEq/L  Glucose                                  125        h      70-99            mg/dL  BUN                                      18                6-23             mg/dL  Creatinine                               0.98              0.4-1.2          mg/dL  GFR, Est Non African American            >60               >60              mL/min  GFR, Est African American                >60               >60              mL/min    Oversized comment, see footnote  1  Calcium                                  9.3               8.4-10.5  mg/dL   Color, Urine                             YELLOW            YELLOW  Appearance                               CLOUDY     a      CLEAR  Specific Gravity                         1.020             1.005-1.030  pH                                       6.0               5.0-8.0  Urine Glucose                            NEGATIVE          NEG              mg/dL  Bilirubin                                NEGATIVE          NEG  Ketones                                  NEGATIVE          NEG              mg/dL  Blood                                    TRACE      a      NEG  Protein                                  NEGATIVE          NEG              mg/dL  Urobilinogen                             0.2               0.0-1.0          mg/dL  Nitrite                                  NEGATIVE          NEG  Leukocytes                               NEGATIVE  NEG  Squamous Epithelial / LPF                RARE              RARE  Urine Crystals                            SEE NOTE.  a      NEG    CA OXALATE CRYSTALS    URIC ACID CRYSTALS  WBC / HPF                                0-2               <3               WBC/hpf  RBC / HPF                                0-2               <3               RBC/hpf  Bacteria / HPF                           MANY       a      RARE  Uric Acid, Serum                         3.7               2.4-7.0          mg/dL   ESR (Sedimentation Rate)                 9                 0-22             mm/hr   Amphetamins                              SEE NOTE.         NDT    NONE DETECTED  Barbiturates                             SEE NOTE.         NDT    Oversized comment, see footnote  1  Benzodiazepines                          SEE NOTE.         NDT    NONE DETECTED  Cocaine                                  POSITIVE   a      NDT  Opiates                                  POSITIVE   a  NDT  Tetrahydrocannabinol                     SEE NOTE.         NDT    NONE DETECTED  CKMB, POC                                1.0               1.0-8.0          ng/mL  Troponin I, POC                          <0.05             0.00-0.09        ng/mL  Myoglobin, POC                           58.7              12-200           ng/mL   Lactic Acid, Venous                      1.9               0.5-2.2          mmol/L  IMAGING:     PORTABLE CHEST - 1 VIEW    Comparison: Chest x-ray 10/12/2010.    Findings: The cardiac silhouette, mediastinal and hilar contours   are within normal limits.  There are chronic bronchitic type   interstitial lung changes which could be related smoking or   reactive airways disease.  No infiltrates, edema or effusions.   Bilateral nipple shadows are noted.  The bony thorax intact.    IMPRESSION:    1.  Chronic-appearing bronchitic type interstitial lung changes   possibly related to smoking or reactive airways disease.   2.  No infiltrates, edema or effusions.  ASSESSMENT AND PLAN: (1) painful right  upper extremity, knee swelling:   Patient with history of levimasole-induced vasculitis, not currently on steroids.   Given her history of vasculitis and that she is afebrile with nl wbc in setting of Cocaine + UDS, we believe her symptoms most likely related to recent cocaine use triggering vasculitic flare.  Infection is less likely.  Patient with significant swelling, in particular of right upper extremity that is limiting closure of her hand and concerning for potential compartment syndrome, however she appears to be perfusing the extremity, maintains sensation, and has strong pulses. Other lesions appear to resemble previous flares except lower eyelid lesions which will need to be watched closely.  - admit to inpatient - serum CK, repeat UA with micro - spun - venous doppler of right upper extremity - 3-view X-ray of right hand - prednisone 10m by mouth daily - BCx x2 - AM CBC - morphine 358mIV q3h as needed - zofran prn  () polysubstance abuse: h/o cocaine and tobacco abuse. Pt states she is currently trying to quit smoking cigarettes, recently down to 1-2 cigarettes/day. Endorses last cocaine use several weeks ago but UDS+. - In AM pt will need SW consult for polysubstance abuse - nicotine patch - thiamine, folate continue  () HTN: normotensive, not currently on any meds  () DEPRESSION/ANXIETY: stable -  continue home prozac  () VTE PROPH: heparin

## 2010-11-12 NOTE — Discharge Summary (Signed)
Summary: Hospital Discharge Update    Hospital Discharge Update:  Date of Admission: 10/29/2010 Date of Discharge: 11/04/2010  Brief Summary:  Patient is a 50 year old woman with a PMH of cocaine abuse, levamisole induced vasculitis, and HTN who presented to the [facility] ED with new onset right arm and right leg swelling.  She had noticed it for a few days prior to admission but that day the pain was too much for her.  She was admitted and started on prednisone and evaluated by vascular surgery.  Doppler ultrasounds of the RUE and RLE were negative for thrombus.  The swelling had resolved by the 2nd day of admission and she was getting set to discharge the next day when she developed swelling around her right eye.  She was started on doxycycline and her prednisone was increased to 120 mg daily.  The morning after the swelling began it had progressed to the point where she couldn't open her eye.  Doxycycline was changed to vancomycin.   That night the swelling started to resolve and by the next day was minimal.  She was changed to Bactrim and her steroids were tapered.  She will be discharged on a 10 day steroid taper to ensure resolution.  She will also continue bactrim for 5 more days.    During her stay her blood pressure was elevated at times.  She was started on Lisinopril 10 mg daily and the date of discharge this was increased to 20 mg daily.  She will need follow up in the [facility] for this and at that time should have a Bmet checked to follow her potassium and creatinine.   She was UDS positive at admission for cocaine again.  She states that she hasn't used but was around people who were using.  This likely is the trigger of this event because of the history of vasculitis secondary to levamisole.  She was encouraged to avoid all exposure to the drug.  Repeat UDS just prior to discharge was negative.   Labs needed at follow-up: Basic metabolic panel  Problem list changes:  Removed problem of  METHICILLIN RESISTANT STAPHYLOCOCCUS AUREUS INFECTION (ICD-041.12)  Medication list changes:  Added new medication of LISINOPRIL 20 MG TABS (LISINOPRIL) Take 1 tablet by mouth once a day for blood pressure Added new medication of PREDNISONE 20 MG TABS (PREDNISONE) Take as directed. Added new medication of SULFAMETHOXAZOLE-TMP DS 800-160 MG TABS (SULFAMETHOXAZOLE-TRIMETHOPRIM) Take 1 tablet by mouth two times a day for 5 days.  The medication, problem, and allergy lists have been updated.  Please see the dictated discharge summary for details.  Discharge medications:  FLUOXETINE HCL 20 MG CAPS (FLUOXETINE HCL) Take 1 capsule by mouth once a day for depression VITAMIN B-12 1000 MCG TABS (CYANOCOBALAMIN) Take 1 tablet by mouth once a day BENADRYL 25 MG CAPS (DIPHENHYDRAMINE HCL) 1 tab every 6 hrs as needed for itching MULTIVITAMINS  CAPS (MULTIPLE VITAMIN) Take 1 tablet by mouth once a day TRAMADOL HCL 50 MG TABS (TRAMADOL HCL) Take 1 tablet by mouth every 6 hours as needed for pain IBUPROFEN 400 MG TABS (IBUPROFEN) 1 tablet by mouth every 6 hours for pain GUAIFENESIN 100 MG/5ML SYRP (GUAIFENESIN) Take 1 teaspoon very 6 hours as neede for cough LISINOPRIL 20 MG TABS (LISINOPRIL) Take 1 tablet by mouth once a day for blood pressure PREDNISONE 20 MG TABS (PREDNISONE) Take as directed. SULFAMETHOXAZOLE-TMP DS 800-160 MG TABS (SULFAMETHOXAZOLE-TRIMETHOPRIM) Take 1 tablet by mouth two times a day for 5  days.  Other patient instructions:  Start Lisinopril 20 mg by mouth daily for blood pressure Start Bactrim DS 1 tablet twice daily by mouth for 5 more days. Start Prednisone 20 mg tablets.  Take 3 tablets for 1 days then take 2 tablets for 3 days then take 1 tablet for 3 days then 1/2 tablet for 3 days then stop.  You have a follow up appointment on 11/13/10 at 8:15 am with Dr. Donnel Saxon at the Outpatient clinics.  Note: Hospital Discharge Medications & Other Instructions handout was printed, one copy  for patient and a second copy to be placed in hospital chart.

## 2010-11-12 NOTE — Discharge Summary (Signed)
NAMESIVAN, CUELLO                  ACCOUNT NO.:  [id number]  MEDICAL RECORD NO.:  [id number]          PATIENT TYPE:  INP  LOCATION:  8937                         FACILITY:  [facility]  PHYSICIAN:  Desiree Hane, MD     DATE OF BIRTH:  [birth year]  DATE OF ADMISSION:  10/29/2010 DATE OF DISCHARGE:  11/04/2010                              DISCHARGE SUMMARY   DISCHARGE DIAGNOSES: 1. Right preseptal periorbital cellulitis. 2. Right lower extremity and right upper extremity swelling. 3. Levamisole-associated vasculitis. 4. Polysubstance abuse. 5. Hypertension. 6. Depression.  DISCHARGE MEDICATIONS: 1. Prozac 20 mg take 1 capsule by mouth daily. 2. Vitamin B12 1000 mcg tablets, take 1 tablet daily by mouth. 3. Benadryl 75 mg caplets, take 1 tablet every 6 hours as needed for     itching. 4. Multivitamin, take 1 tablet by mouth daily. 5. Tramadol 50 mg, take 1 tablet by mouth every 6 hours as needed for     pain. 6. Ibuprofen 40 mg, take 1 tablet by mouth every 6 hours for pain. 7. Guaifenesin 100 mg/5 mL syrup, take 1 teaspoon every 6 hours as     needed for cough. 8. Lisinopril 20 mg tablets, take 1 tablet by mouth each day once     daily for blood pressure. 9. Prednisone 20 mg tablets, take as directed. 10.Bactrim DS take 1 tablet by mouth twice daily for the next 5 days.  DISPOSITION AND FOLLOWUP:  Ms. Dilling was discharged from [facility] in stable condition.  She will have a followup appointment with Dr. Donnel Saxon in the outpatient clinic on November 13, 2010 at 8:15 in the morning.  At that visit, she should have a BMET done to follow her potassium and also her creatinine since the beginning of lisinopril.  She should also be discussed with continuing to maintain her abstinence from cocaine.  CONSULTATIONS:  None.  PROCEDURES PERFORMED: 1. Chest x-ray which showed chronic-appearing bronchiectatic     interstitial lung changes possibly related to smoking and  reactive     airway disease.  No infiltrates, edema, or effusions. 2. Right hand x-ray which showed a progressive soft tissue swelling     with no acute bony abnormality.  ADMISSION HISTORY AND PHYSICAL:  The patient is a 50 year old with history of levamisole-induced vasculitis and cocaine abuse who presents with a 1-day history of right hand and right knee swelling and pain. She describes her pain as severe 10/10 present whenever those areas are moved or touched.  She is unable to close her right hand secondary to pain and swelling.  She is unable to move her right knee because of the pain.  The patient has also noticed erythematous swelling of the forehead,  pustular lesions at her lower eyelid and open sores on her left buttock and gluteal crease.  These are painful.  The patient denies any fevers but endorses chills.  She states she has not used any cocaine prior to her last admission.  She has been eating well although not today since she has felt nauseous and vomited once.  She endorses some vague lower  sternal chest pain that she says feels like a knot that is going to come up similar to her other swelling.  She endorses some shortness of breath when these painful areas of her body is touched or moved.  She denies any abdominal pain, dysuria, changes in bowel movement, or recent changes.  ADMISSION PHYSICAL EXAMINATION:  VITAL SIGNS:  Temperature 97.8, blood pressure was 161/101, pulse was 116, respirations were 24, saturating 100% on room air. GENERAL:  The patient is tearful and appears to be in pain.  Cooperative with examination. HEENT:  Eyes, pupils are equal, round, reactive to light.  Extraocular movements intact.  Conjunctivae are mildly injected.  There are several 3-5 mm pustular, inflamed, tender lesions on her lower eyelids.  The patient is able to close and open her eyes fully.  ENT shows mucous membranes that are moist without ulcers or lesions. NECK:  Supple.   No adenopathy. RESPIRATORY:  Clear to auscultation bilaterally.  No wheezes, crackles, or rales. CARDIAC:  Tachycardic regular rhythm.  No murmurs, rubs, or gallops. ABDOMEN:  Soft, nontender, nondistended.  No guarding.  Bowel sounds are positive.  No organomegaly. EXTREMITIES:  No pedal edema, cyanosis, or clubbing.  There is a tense warm exquisitely tender swelling of the right arm from the fingertip to the mid humerus without pitting.  Strong radial pulses bilaterally. Fingertips are warm but cooler than the rest of her arm or her hand. Sensation is intact.  There is delayed capillary refill.  The patient is unable to close her right fist because of the tense swelling and pain. Right knee and ankle and tender, erythematous, warm and swollen with limited range of motion because of the pain., 2+ pedal and posterior tibial pulses are felt. GU:  No CVA tenderness. SKIN:  A 1 cm full-thickness ulcer on the top of the gluteal cleft with exposed granulation tissue without drainage.  She also as a 1-cm partial thickness ulcer on the left buttock with pink granulation tissue without drainage.  The patient has numerous healing scars on her feet, ears, trunk, extremities from previous vasculitis flares. LYMPH:  There is a palpable tender, mobile right mid axillary lymph node. MUSCULOSKELETAL:  Moving all 4 extremities. NEUROLOGIC:  Oriented x3.  Cranial nerves II-XII are intact.  Motor strength was 5/5 in all of her other extremities other than the right arm and leg.  Sensations were intact to normal, light touch. PSYCH:  She alert and oriented x3, tearful but appropriate.  ADMISSION LABORATORIES:  White count was 6.8, hemoglobin was 13.9, hematocrit was 41.7, MCV was 82.7, platelets were 404.  ANC was 5.4. Sodium is 134, potassium is 5.1, chloride was 102, bicarb was 22. Glucose was 125, BUN was 18, creatinine 0.98, calcium was 9.3. Urinalysis was negative except for trace blood and many  bacteria, also rare squamous cells.  Uric acid was 3.7.  ESR was 9.  UDS was positive for cocaine and opiates.  Cardiac enzymes, CK-MB was 1.0, troponin was less than 0.05, myoglobin was 58.7, lactic acid was 1.9.  HOSPITAL COURSE BY PROBLEM LIST: 1. Right arm, right leg, and right eye swelling.  When she initially     presented there was some concern for compartment syndrome.  She was     evaluated by Vascular Surgery and venous Doppler studies were done     which showed no evidence of thrombus.  X-ray of the arm did show     some mild soft tissue swelling consistent with a possible  cellulitis.  She was started on steroids, prednisone 120 because     this was thought to be originally secondary to her usual flare of     the vasculitis.  Swelling had resolved by the second day of     admission and she was getting set to discharge the next day when     she developed a swelling around her right eye.  She was started on     doxycycline and prednisone was increased to 120 mg daily.  The     morning after the swelling had began, it had progressed to the     point where she could not open her right eye.  Doxycycline was     changed to vancomycin and the prednisone was held at 120 mg that     day.  That night the swelling started to resolve and the next day     the swelling was minimal.  She was changed to Bactrim p.o. and her     steroids were tapered.  She was discharged on a 10-day steroid     taper to ensure resolution of the swelling and vasculitis flare.     She was also continued on Bactrim for 5 more days to ensure there     was no other infection. 2. Hypertension.  During her stay, her blood pressure was elevated at     times.  She was started on lisinopril 10 mg daily and the date of     discharge it was increased to 20 mg daily.  She will need this     followed up in the [facility] and have a BMET checked at that time to     follow her potassium and creatinine. 3. Polysubstance abuse.   UDS was positive for cocaine on admission     again.  She states that she has not personally used but was around     people who were using it.  This is likely the trigger for this     event because of the history of vasculitis secondary to levamisole     and some question if she does have vasculitis secondary to cocaine     exposure itself.  She was encouraged to avoid all exposure to     drugs.  Repeat UDS on the date prior to discharge was negative. 4. Depression and anxiety.  She was maintained on her home Prozac     throughout her stay.    ______________________________ Trish Fountain, MD   ______________________________ Desiree Hane, MD    CP/MEDQ  D:  11/04/2010  T:  11/04/2010  Job:  [id number]  cc:   Sheral Apley, MD  Electronically Signed by Trish Fountain MD on 11/05/2010 11:27:22 PM Electronically Signed by Desiree Hane  on 11/12/2010 07:25:20 AM

## 2010-11-13 ENCOUNTER — Encounter: Payer: Self-pay | Admitting: Internal Medicine

## 2010-11-13 ENCOUNTER — Ambulatory Visit (INDEPENDENT_AMBULATORY_CARE_PROVIDER_SITE_OTHER): Payer: Self-pay | Admitting: Internal Medicine

## 2010-11-13 VITALS — BP 98/58 | HR 87 | Temp 98.8°F | Ht 61.0 in | Wt 102.9 lb

## 2010-11-13 DIAGNOSIS — M129 Arthropathy, unspecified: Secondary | ICD-10-CM

## 2010-11-13 DIAGNOSIS — I10 Essential (primary) hypertension: Secondary | ICD-10-CM

## 2010-11-13 DIAGNOSIS — I776 Arteritis, unspecified: Secondary | ICD-10-CM

## 2010-11-13 LAB — BASIC METABOLIC PANEL
Calcium: 9.3 mg/dL (ref 8.4–10.5)
Potassium: 4.3 mEq/L (ref 3.5–5.3)
Sodium: 141 mEq/L (ref 135–145)

## 2010-11-13 MED ORDER — TRAMADOL HCL 50 MG PO TABS: 50.0000 mg | ORAL_TABLET | Freq: Four times a day (QID) | ORAL | Status: DC | PRN

## 2010-11-13 NOTE — Progress Notes (Signed)
  Subjective:    [name], female    DOB: 09-Feb-1961, 50 y.o.   MRN: [id number]  HPI  Pt is a 50 y/o woman with pmh of cocaine abuse, levimasole induced vasculitis and HTN, here on hospital followup. She was admitted to the IM service from 1/25 to 1/31 right preseptal periorbital cellulitis that was treated with IV Vanc and Bactrim po on outpatient, as well as right upper and lower extremity swelling thought to be secondary to cellulitis vs vasculitis flare, for which she was started on Prednisone, to complete 10 day taper on outpatient as well as Bactrim.  Since her discharge, she reports significant clinical improvement, stating that "whatever they did this time worked well." The eye and limb swelling have completely resolved. She also reports she has been compliant with all her meds, completed the steroid and abx course.  She has no new complaints today.  Review of Systems  Constitutional: Negative for fever and unexpected weight change.  Respiratory: Negative for shortness of breath.   Cardiovascular: Negative for chest pain and palpitations.  Gastrointestinal: Negative for nausea, vomiting and abdominal pain.  Genitourinary: Negative for dysuria and frequency.  Neurological: Negative for weakness and numbness.       Objective:   Physical Exam  Constitutional: She is oriented to person, place, and time. She appears well-developed and well-nourished. No distress.  HENT:       No orbital or periorbital swelling noted  Cardiovascular: Normal rate, regular rhythm and normal heart sounds.  Exam reveals no gallop and no friction rub.   No murmur heard. Pulmonary/Chest: Effort normal and breath sounds normal. No respiratory distress. She has no wheezes. She has no rales.  Abdominal: Soft. Bowel sounds are normal. There is no tenderness.  Musculoskeletal: Normal range of motion. She exhibits no edema.  Neurological: She is alert and oriented to person, place, and time.    Psychiatric: She has a normal mood and affect.          Assessment & Plan:

## 2010-11-13 NOTE — Assessment & Plan Note (Signed)
Stable. Continue Ibuprofen and Tramadol as needed.

## 2010-11-13 NOTE — Patient Instructions (Addendum)
Continue to take all your medicines as prescribed. Please report any fever to 101/higher, chest pain or trouble breathing to your nearest ER. Please call the clinic if you have any questions or concerns.

## 2010-11-13 NOTE — Assessment & Plan Note (Signed)
Pressures well controlled today on Lisinopril.  Will check b-met to assess renal function since this was increased during her hospital admission.

## 2010-11-13 NOTE — Assessment & Plan Note (Signed)
Stable. No skin changes or lesions noted.

## 2010-11-17 ENCOUNTER — Emergency Department (HOSPITAL_COMMUNITY)
Admission: EM | Admit: 2010-11-17 | Discharge: 2010-11-18 | Disposition: A | Discharge status: Home / Self Care | Payer: Medicaid Other | Attending: Emergency Medicine | Admitting: Emergency Medicine

## 2010-11-17 DIAGNOSIS — I10 Essential (primary) hypertension: Secondary | ICD-10-CM | POA: Insufficient documentation

## 2010-11-17 DIAGNOSIS — S01501A Unspecified open wound of lip, initial encounter: Secondary | ICD-10-CM | POA: Insufficient documentation

## 2010-11-17 DIAGNOSIS — W540XXA Bitten by dog, initial encounter: Secondary | ICD-10-CM | POA: Insufficient documentation

## 2010-11-17 DIAGNOSIS — Z23 Encounter for immunization: Secondary | ICD-10-CM | POA: Insufficient documentation

## 2010-11-17 DIAGNOSIS — M069 Rheumatoid arthritis, unspecified: Secondary | ICD-10-CM | POA: Insufficient documentation

## 2010-11-17 DIAGNOSIS — Z203 Contact with and (suspected) exposure to rabies: Secondary | ICD-10-CM | POA: Insufficient documentation

## 2010-11-20 ENCOUNTER — Inpatient Hospital Stay (INDEPENDENT_AMBULATORY_CARE_PROVIDER_SITE_OTHER)
Admission: RE | Admit: 2010-11-20 | Discharge: 2010-11-20 | Disposition: A | Discharge status: Home / Self Care | Payer: Medicaid Other | Source: Ambulatory Visit | Attending: Family Medicine | Admitting: Family Medicine

## 2010-11-20 DIAGNOSIS — Z23 Encounter for immunization: Secondary | ICD-10-CM

## 2010-11-24 ENCOUNTER — Inpatient Hospital Stay (INDEPENDENT_AMBULATORY_CARE_PROVIDER_SITE_OTHER)
Admission: RE | Admit: 2010-11-24 | Discharge: 2010-11-24 | Disposition: A | Discharge status: Home / Self Care | Payer: Medicaid Other | Source: Ambulatory Visit

## 2010-11-24 DIAGNOSIS — Z23 Encounter for immunization: Secondary | ICD-10-CM

## 2010-12-01 ENCOUNTER — Inpatient Hospital Stay (INDEPENDENT_AMBULATORY_CARE_PROVIDER_SITE_OTHER)
Admission: RE | Admit: 2010-12-01 | Discharge: 2010-12-01 | Disposition: A | Discharge status: Home / Self Care | Payer: Medicaid Other | Source: Ambulatory Visit

## 2010-12-01 DIAGNOSIS — Z23 Encounter for immunization: Secondary | ICD-10-CM

## 2010-12-14 ENCOUNTER — Emergency Department (HOSPITAL_COMMUNITY)
Admission: EM | Admit: 2010-12-14 | Discharge: 2010-12-15 | Disposition: A | Discharge status: Home / Self Care | Payer: Medicaid Other | Attending: Emergency Medicine | Admitting: Emergency Medicine

## 2010-12-14 ENCOUNTER — Emergency Department (HOSPITAL_COMMUNITY): Payer: Medicaid Other

## 2010-12-14 DIAGNOSIS — R059 Cough, unspecified: Secondary | ICD-10-CM | POA: Insufficient documentation

## 2010-12-14 DIAGNOSIS — R509 Fever, unspecified: Secondary | ICD-10-CM | POA: Insufficient documentation

## 2010-12-14 DIAGNOSIS — M129 Arthropathy, unspecified: Secondary | ICD-10-CM | POA: Insufficient documentation

## 2010-12-14 DIAGNOSIS — F329 Major depressive disorder, single episode, unspecified: Secondary | ICD-10-CM | POA: Insufficient documentation

## 2010-12-14 DIAGNOSIS — M069 Rheumatoid arthritis, unspecified: Secondary | ICD-10-CM | POA: Insufficient documentation

## 2010-12-14 DIAGNOSIS — R05 Cough: Secondary | ICD-10-CM | POA: Insufficient documentation

## 2010-12-14 DIAGNOSIS — I10 Essential (primary) hypertension: Secondary | ICD-10-CM | POA: Insufficient documentation

## 2010-12-14 DIAGNOSIS — F3289 Other specified depressive episodes: Secondary | ICD-10-CM | POA: Insufficient documentation

## 2010-12-14 DIAGNOSIS — R42 Dizziness and giddiness: Secondary | ICD-10-CM | POA: Insufficient documentation

## 2010-12-14 DIAGNOSIS — J40 Bronchitis, not specified as acute or chronic: Secondary | ICD-10-CM | POA: Insufficient documentation

## 2010-12-14 DIAGNOSIS — G8929 Other chronic pain: Secondary | ICD-10-CM | POA: Insufficient documentation

## 2010-12-14 DIAGNOSIS — Z79899 Other long term (current) drug therapy: Secondary | ICD-10-CM | POA: Insufficient documentation

## 2010-12-14 LAB — DIFFERENTIAL
Basophils Absolute: 0 10*3/uL (ref 0.0–0.1)
Eosinophils Relative: 0 % (ref 0–5)
Lymphocytes Relative: 18 % (ref 12–46)
Neutro Abs: 4.4 10*3/uL (ref 1.7–7.7)
Neutrophils Relative %: 79 % — ABNORMAL HIGH (ref 43–77)

## 2010-12-14 LAB — URINALYSIS, ROUTINE W REFLEX MICROSCOPIC
Nitrite: NEGATIVE
Specific Gravity, Urine: 1.011 (ref 1.005–1.030)
pH: 7.5 (ref 5.0–8.0)

## 2010-12-14 LAB — CBC
HCT: 32.2 % — ABNORMAL LOW (ref 36.0–46.0)
Hemoglobin: 10.9 g/dL — ABNORMAL LOW (ref 12.0–15.0)
RBC: 3.75 MIL/uL — ABNORMAL LOW (ref 3.87–5.11)
RDW: 15.8 % — ABNORMAL HIGH (ref 11.5–15.5)
WBC: 5.6 10*3/uL (ref 4.0–10.5)

## 2010-12-15 LAB — DIFFERENTIAL
Basophils Absolute: 0 10*3/uL (ref 0.0–0.1)
Basophils Relative: 0 % (ref 0–1)
Eosinophils Absolute: 0.1 10*3/uL (ref 0.0–0.7)
Monocytes Absolute: 0.2 10*3/uL (ref 0.1–1.0)
Neutro Abs: 2.1 10*3/uL (ref 1.7–7.7)
Neutrophils Relative %: 64 % (ref 43–77)

## 2010-12-15 LAB — URINALYSIS, ROUTINE W REFLEX MICROSCOPIC
Glucose, UA: NEGATIVE mg/dL
Hgb urine dipstick: NEGATIVE
Ketones, ur: NEGATIVE mg/dL
Protein, ur: NEGATIVE mg/dL

## 2010-12-15 LAB — COMPREHENSIVE METABOLIC PANEL
ALT: 12 U/L (ref 0–35)
Alkaline Phosphatase: 60 U/L (ref 39–117)
Glucose, Bld: 83 mg/dL (ref 70–99)
Potassium: 3.8 mEq/L (ref 3.5–5.1)
Sodium: 133 mEq/L — ABNORMAL LOW (ref 135–145)
Total Protein: 6.2 g/dL (ref 6.0–8.3)

## 2010-12-15 LAB — RAPID URINE DRUG SCREEN, HOSP PERFORMED
Amphetamines: NOT DETECTED
Barbiturates: NOT DETECTED
Benzodiazepines: NOT DETECTED
Cocaine: POSITIVE — AB
Opiates: POSITIVE — AB

## 2010-12-15 LAB — CBC
MCHC: 33.2 g/dL (ref 30.0–36.0)
RDW: 16.8 % — ABNORMAL HIGH (ref 11.5–15.5)

## 2010-12-15 LAB — BASIC METABOLIC PANEL
BUN: 12 mg/dL (ref 6–23)
Calcium: 8.7 mg/dL (ref 8.4–10.5)
GFR calc non Af Amer: >60 mL/min (ref >60)
Glucose, Bld: 101 mg/dL — ABNORMAL HIGH (ref 70–99)

## 2010-12-16 LAB — URINALYSIS, ROUTINE W REFLEX MICROSCOPIC
Bilirubin Urine: NEGATIVE
Bilirubin Urine: NEGATIVE
Glucose, UA: NEGATIVE mg/dL
Glucose, UA: NEGATIVE mg/dL
Ketones, ur: NEGATIVE mg/dL
Ketones, ur: NEGATIVE mg/dL
Ketones, ur: NEGATIVE mg/dL
Ketones, ur: NEGATIVE mg/dL
Nitrite: NEGATIVE
Nitrite: NEGATIVE
Nitrite: NEGATIVE
Nitrite: NEGATIVE
Nitrite: NEGATIVE
Protein, ur: NEGATIVE mg/dL
Protein, ur: 30 mg/dL — AB
Protein, ur: NEGATIVE mg/dL
Protein, ur: NEGATIVE mg/dL
Specific Gravity, Urine: 1.011 (ref 1.005–1.030)
Specific Gravity, Urine: 1.028 (ref 1.005–1.030)
Urobilinogen, UA: 0.2 mg/dL (ref 0.0–1.0)
Urobilinogen, UA: 0.2 mg/dL (ref 0.0–1.0)
Urobilinogen, UA: 0.2 mg/dL (ref 0.0–1.0)
pH: 5.5 (ref 5.0–8.0)
pH: 5.5 (ref 5.0–8.0)

## 2010-12-16 LAB — DIFFERENTIAL
Basophils Absolute: 0 10*3/uL (ref 0.0–0.1)
Basophils Absolute: 0 10*3/uL (ref 0.0–0.1)
Basophils Relative: 0 % (ref 0–1)
Basophils Relative: 0 % (ref 0–1)
Basophils Relative: 0 % (ref 0–1)
Basophils Relative: 0 % (ref 0–1)
Eosinophils Absolute: 0 10*3/uL (ref 0.0–0.7)
Eosinophils Absolute: 0.1 10*3/uL (ref 0.0–0.7)
Eosinophils Absolute: 0.1 10*3/uL (ref 0.0–0.7)
Eosinophils Relative: 1 % (ref 0–5)
Eosinophils Relative: 2 % (ref 0–5)
Eosinophils Relative: 5 % (ref 0–5)
Lymphocytes Relative: 25 % (ref 12–46)
Lymphocytes Relative: 20 % (ref 12–46)
Lymphocytes Relative: 21 % (ref 12–46)
Lymphs Abs: 1 10*3/uL (ref 0.7–4.0)
Monocytes Absolute: 0.1 10*3/uL (ref 0.1–1.0)
Monocytes Absolute: 0.2 10*3/uL (ref 0.1–1.0)
Monocytes Absolute: 0.2 10*3/uL (ref 0.1–1.0)
Monocytes Absolute: 0.3 10*3/uL (ref 0.1–1.0)
Monocytes Relative: 3 % (ref 3–12)
Monocytes Relative: 5 % (ref 3–12)
Monocytes Relative: 6 % (ref 3–12)
Neutro Abs: 3.3 10*3/uL (ref 1.7–7.7)
Neutro Abs: 3.5 10*3/uL (ref 1.7–7.7)
Neutro Abs: 3.7 10*3/uL (ref 1.7–7.7)
Neutrophils Relative %: 76 % (ref 43–77)

## 2010-12-16 LAB — CBC
HCT: 29.6 % — ABNORMAL LOW (ref 36.0–46.0)
HCT: 32 % — ABNORMAL LOW (ref 36.0–46.0)
HCT: 34.2 % — ABNORMAL LOW (ref 36.0–46.0)
HCT: 34.7 % — ABNORMAL LOW (ref 36.0–46.0)
Hemoglobin: 10.8 g/dL — ABNORMAL LOW (ref 12.0–15.0)
Hemoglobin: 11.7 g/dL — ABNORMAL LOW (ref 12.0–15.0)
Hemoglobin: 11.8 g/dL — ABNORMAL LOW (ref 12.0–15.0)
MCH: 28.3 pg (ref 26.0–34.0)
MCH: 27.1 pg (ref 26.0–34.0)
MCH: 27.7 pg (ref 26.0–34.0)
MCV: 83.6 fL (ref 78.0–100.0)
Platelets: 324 10*3/uL (ref 150–400)
Platelets: 359 10*3/uL (ref 150–400)
RBC: 3.54 MIL/uL — ABNORMAL LOW (ref 3.87–5.11)
RBC: 3.76 MIL/uL — ABNORMAL LOW (ref 3.87–5.11)
RBC: 3.86 MIL/uL — ABNORMAL LOW (ref 3.87–5.11)
RBC: 4.13 MIL/uL (ref 3.87–5.11)
RBC: 4.15 MIL/uL (ref 3.87–5.11)
RDW: 15.7 % — ABNORMAL HIGH (ref 11.5–15.5)
RDW: 16 % — ABNORMAL HIGH (ref 11.5–15.5)
RDW: 16.1 % — ABNORMAL HIGH (ref 11.5–15.5)
WBC: 4 10*3/uL (ref 4.0–10.5)
WBC: 4.6 10*3/uL (ref 4.0–10.5)
WBC: 4.8 10*3/uL (ref 4.0–10.5)
WBC: 4.9 10*3/uL (ref 4.0–10.5)
WBC: 5.4 10*3/uL (ref 4.0–10.5)

## 2010-12-16 LAB — CK TOTAL AND CKMB (NOT AT ARMC)
CK, MB: 1.1 ng/mL (ref 0.3–4.0)
CK, MB: 0.8 ng/mL (ref 0.3–4.0)
Relative Index: INVALID (ref 0.0–2.5)
Total CK: 60 U/L (ref 7–177)
Total CK: 45 U/L (ref 7–177)

## 2010-12-16 LAB — BODY FLUID CULTURE

## 2010-12-16 LAB — BASIC METABOLIC PANEL
BUN: 13 mg/dL (ref 6–23)
Calcium: 9.4 mg/dL (ref 8.4–10.5)
GFR calc non Af Amer: >60 mL/min (ref >60)
Potassium: 4.2 mEq/L (ref 3.5–5.1)
Sodium: 140 mEq/L (ref 135–145)

## 2010-12-16 LAB — COMPREHENSIVE METABOLIC PANEL
ALT: 15 U/L (ref 0–35)
ALT: 16 U/L (ref 0–35)
ALT: 16 U/L (ref 0–35)
AST: 22 U/L (ref 0–37)
AST: 14 U/L (ref 0–37)
AST: 19 U/L (ref 0–37)
Albumin: 2.6 g/dL — ABNORMAL LOW (ref 3.5–5.2)
Albumin: 3.4 g/dL — ABNORMAL LOW (ref 3.5–5.2)
Albumin: 3.4 g/dL — ABNORMAL LOW (ref 3.5–5.2)
Alkaline Phosphatase: 58 U/L (ref 39–117)
Alkaline Phosphatase: 62 U/L (ref 39–117)
Alkaline Phosphatase: 68 U/L (ref 39–117)
BUN: 11 mg/dL (ref 6–23)
CO2: 29 mEq/L (ref 19–32)
Calcium: 9 mg/dL (ref 8.4–10.5)
Chloride: 104 mEq/L (ref 96–112)
Chloride: 104 mEq/L (ref 96–112)
Chloride: 104 mEq/L (ref 96–112)
Chloride: 108 mEq/L (ref 96–112)
Creatinine, Ser: 0.82 mg/dL (ref 0.4–1.2)
Creatinine, Ser: 0.57 mg/dL (ref 0.4–1.2)
Creatinine, Ser: 0.63 mg/dL (ref 0.4–1.2)
GFR calc Af Amer: >60 mL/min (ref >60)
GFR calc Af Amer: >60 mL/min (ref >60)
GFR calc Af Amer: >60 mL/min (ref >60)
GFR calc non Af Amer: >60 mL/min (ref >60)
GFR calc non Af Amer: >60 mL/min (ref >60)
Glucose, Bld: 102 mg/dL — ABNORMAL HIGH (ref 70–99)
Glucose, Bld: 119 mg/dL — ABNORMAL HIGH (ref 70–99)
Potassium: 3.6 mEq/L (ref 3.5–5.1)
Potassium: 3.7 mEq/L (ref 3.5–5.1)
Potassium: 3.7 mEq/L (ref 3.5–5.1)
Sodium: 138 mEq/L (ref 135–145)
Sodium: 139 mEq/L (ref 135–145)
Total Bilirubin: 0.2 mg/dL — ABNORMAL LOW (ref 0.3–1.2)
Total Bilirubin: 0.3 mg/dL (ref 0.3–1.2)
Total Bilirubin: 0.4 mg/dL (ref 0.3–1.2)
Total Protein: 6 g/dL (ref 6.0–8.3)
Total Protein: 6.6 g/dL (ref 6.0–8.3)
Total Protein: 6.8 g/dL (ref 6.0–8.3)

## 2010-12-16 LAB — RAPID URINE DRUG SCREEN, HOSP PERFORMED
Amphetamines: NOT DETECTED
Barbiturates: NOT DETECTED
Benzodiazepines: NOT DETECTED
Benzodiazepines: NOT DETECTED
Opiates: NOT DETECTED
Tetrahydrocannabinol: NOT DETECTED

## 2010-12-16 LAB — CULTURE, BLOOD (ROUTINE X 2): Culture  Setup Time: 201112130117

## 2010-12-16 LAB — URINE MICROSCOPIC-ADD ON

## 2010-12-16 LAB — FUNGUS CULTURE W SMEAR: Fungal Smear: NONE SEEN

## 2010-12-16 LAB — URINE DRUGS OF ABUSE SCREEN W ALC, ROUTINE (REF LAB)
Amphetamine Screen, Ur: NEGATIVE
Amphetamine Screen, Ur: NEGATIVE
Barbiturate Quant, Ur: NEGATIVE
Benzodiazepines.: NEGATIVE
Benzodiazepines.: NEGATIVE
Cocaine Metabolites: NEGATIVE
Creatinine,U: 224 mg/dL
Ethyl Alcohol: <10 mg/dL (ref <10)
Marijuana Metabolite: NEGATIVE
Marijuana Metabolite: NEGATIVE
Methadone: NEGATIVE
Opiate Screen, Urine: NEGATIVE
Opiate Screen, Urine: NEGATIVE
Propoxyphene: NEGATIVE

## 2010-12-16 LAB — SYNOVIAL CELL COUNT + DIFF, W/ CRYSTALS
Lymphocytes-Synovial Fld: 8 % (ref 0–20)
Monocyte-Macrophage-Synovial Fluid: 77 % (ref 50–90)
Neutrophil, Synovial: 15 % (ref 0–25)

## 2010-12-16 LAB — POCT I-STAT, CHEM 8
Calcium, Ion: 1.1 mmol/L — ABNORMAL LOW (ref 1.12–1.32)
Chloride: 104 mEq/L (ref 96–112)
Glucose, Bld: 118 mg/dL — ABNORMAL HIGH (ref 70–99)
HCT: 30 % — ABNORMAL LOW (ref 36.0–46.0)
TCO2: 26 mmol/L (ref 0–100)

## 2010-12-16 LAB — LIPASE, BLOOD: Lipase: 32 U/L (ref 11–59)

## 2010-12-16 LAB — WOUND CULTURE: Culture: NO GROWTH

## 2010-12-16 LAB — GRAM STAIN

## 2010-12-16 LAB — STOOL CULTURE

## 2010-12-16 LAB — GIARDIA/CRYPTOSPORIDIUM SCREEN(EIA)
Cryptosporidium Screen (EIA): NEGATIVE
Giardia Screen - EIA: NEGATIVE

## 2010-12-16 LAB — PROTIME-INR
INR: 1.03 (ref 0.00–1.49)
Prothrombin Time: 13.7 seconds (ref 11.6–15.2)

## 2010-12-16 LAB — CARDIAC PANEL(CRET KIN+CKTOT+MB+TROPI)
Relative Index: INVALID (ref 0.0–2.5)
Relative Index: INVALID (ref 0.0–2.5)
Total CK: 51 U/L (ref 7–177)
Troponin I: 0.01 ng/mL (ref 0.00–0.06)
Troponin I: <0.01 ng/mL (ref 0.00–0.06)

## 2010-12-16 LAB — LIPID PANEL
Cholesterol: 199 mg/dL (ref 0–200)
HDL: 47 mg/dL (ref >39)

## 2010-12-16 LAB — POCT CARDIAC MARKERS: CKMB, poc: <1 ng/mL — ABNORMAL LOW (ref 1.0–8.0)

## 2010-12-16 LAB — SEDIMENTATION RATE: Sed Rate: 34 mm/hr — ABNORMAL HIGH (ref 0–22)

## 2010-12-16 LAB — AFB CULTURE WITH SMEAR (NOT AT ARMC)

## 2010-12-16 LAB — RAPID STREP SCREEN (MED CTR MEBANE ONLY): Streptococcus, Group A Screen (Direct): NEGATIVE

## 2010-12-16 LAB — CLOSTRIDIUM DIFFICILE EIA: C difficile Toxins A+B, EIA: NEGATIVE

## 2010-12-17 LAB — URINE MICROSCOPIC-ADD ON

## 2010-12-17 LAB — COMPREHENSIVE METABOLIC PANEL
ALT: 28 U/L (ref 0–35)
AST: 30 U/L (ref 0–37)
Alkaline Phosphatase: 77 U/L (ref 39–117)
CO2: 24 mEq/L (ref 19–32)
Chloride: 104 mEq/L (ref 96–112)
GFR calc Af Amer: >60 mL/min (ref >60)
GFR calc non Af Amer: >60 mL/min (ref >60)
Glucose, Bld: 107 mg/dL — ABNORMAL HIGH (ref 70–99)
Potassium: 4 mEq/L (ref 3.5–5.1)
Sodium: 139 mEq/L (ref 135–145)
Total Bilirubin: 0.8 mg/dL (ref 0.3–1.2)

## 2010-12-17 LAB — URINALYSIS, ROUTINE W REFLEX MICROSCOPIC
Hgb urine dipstick: NEGATIVE
Hgb urine dipstick: NEGATIVE
Ketones, ur: 15 mg/dL — AB
Nitrite: NEGATIVE
Nitrite: NEGATIVE
Protein, ur: >300 mg/dL — AB
Protein, ur: NEGATIVE mg/dL
Specific Gravity, Urine: 1.022 (ref 1.005–1.030)
Urobilinogen, UA: 1 mg/dL (ref 0.0–1.0)
Urobilinogen, UA: 4 mg/dL — ABNORMAL HIGH (ref 0.0–1.0)
pH: 6.5 (ref 5.0–8.0)
pH: 7 (ref 5.0–8.0)

## 2010-12-17 LAB — DIFFERENTIAL
Basophils Absolute: 0 10*3/uL (ref 0.0–0.1)
Basophils Absolute: 0 10*3/uL (ref 0.0–0.1)
Basophils Absolute: 0 10*3/uL (ref 0.0–0.1)
Basophils Relative: 0 % (ref 0–1)
Basophils Relative: 0 % (ref 0–1)
Eosinophils Absolute: 0 10*3/uL (ref 0.0–0.7)
Eosinophils Absolute: 0.1 10*3/uL (ref 0.0–0.7)
Eosinophils Relative: 0 % (ref 0–5)
Lymphocytes Relative: 17 % (ref 12–46)
Lymphs Abs: 0.6 10*3/uL — ABNORMAL LOW (ref 0.7–4.0)
Monocytes Relative: 7 % (ref 3–12)
Neutro Abs: 2.9 10*3/uL (ref 1.7–7.7)
Neutro Abs: 3.7 10*3/uL (ref 1.7–7.7)
Neutrophils Relative %: 70 % (ref 43–77)
Neutrophils Relative %: 88 % — ABNORMAL HIGH (ref 43–77)

## 2010-12-17 LAB — BASIC METABOLIC PANEL
BUN: 10 mg/dL (ref 6–23)
BUN: 10 mg/dL (ref 6–23)
BUN: 11 mg/dL (ref 6–23)
CO2: 24 mEq/L (ref 19–32)
Calcium: 8.5 mg/dL (ref 8.4–10.5)
Calcium: 9 mg/dL (ref 8.4–10.5)
Creatinine, Ser: 0.47 mg/dL (ref 0.4–1.2)
Creatinine, Ser: 0.63 mg/dL (ref 0.4–1.2)
GFR calc Af Amer: >60 mL/min (ref >60)
GFR calc non Af Amer: >60 mL/min (ref >60)
GFR calc non Af Amer: >60 mL/min (ref >60)
Glucose, Bld: 105 mg/dL — ABNORMAL HIGH (ref 70–99)
Glucose, Bld: 121 mg/dL — ABNORMAL HIGH (ref 70–99)
Potassium: 3.3 mEq/L — ABNORMAL LOW (ref 3.5–5.1)
Sodium: 140 mEq/L (ref 135–145)

## 2010-12-17 LAB — RAPID URINE DRUG SCREEN, HOSP PERFORMED
Benzodiazepines: NOT DETECTED
Cocaine: POSITIVE — AB
Tetrahydrocannabinol: NOT DETECTED

## 2010-12-17 LAB — CBC
HCT: 32.7 % — ABNORMAL LOW (ref 36.0–46.0)
HCT: 34 % — ABNORMAL LOW (ref 36.0–46.0)
HCT: 38.9 % (ref 36.0–46.0)
Hemoglobin: 13.2 g/dL (ref 12.0–15.0)
MCH: 28.3 pg (ref 26.0–34.0)
MCHC: 33 g/dL (ref 30.0–36.0)
MCHC: 33.7 g/dL (ref 30.0–36.0)
MCV: 84.2 fL (ref 78.0–100.0)
Platelets: 266 10*3/uL (ref 150–400)
Platelets: 301 10*3/uL (ref 150–400)
Platelets: 420 10*3/uL — ABNORMAL HIGH (ref 150–400)
RBC: 3.49 MIL/uL — ABNORMAL LOW (ref 3.87–5.11)
RBC: 4.04 MIL/uL (ref 3.87–5.11)
RBC: 4.65 MIL/uL (ref 3.87–5.11)
RDW: 15.2 % (ref 11.5–15.5)
RDW: 15.6 % — ABNORMAL HIGH (ref 11.5–15.5)
RDW: 16.1 % — ABNORMAL HIGH (ref 11.5–15.5)
RDW: 18.4 % — ABNORMAL HIGH (ref 11.5–15.5)
WBC: 4.7 10*3/uL (ref 4.0–10.5)
WBC: 5.1 10*3/uL (ref 4.0–10.5)

## 2010-12-17 LAB — POCT CARDIAC MARKERS
CKMB, poc: <1 ng/mL — ABNORMAL LOW (ref 1.0–8.0)
Myoglobin, poc: 38.6 ng/mL (ref 12–200)

## 2010-12-17 LAB — LIPASE, BLOOD: Lipase: 13 U/L (ref 11–59)

## 2010-12-17 LAB — D-DIMER, QUANTITATIVE: D-Dimer, Quant: 1.82 ug/mL-FEU — ABNORMAL HIGH (ref 0.00–0.48)

## 2010-12-18 ENCOUNTER — Encounter: Payer: Self-pay | Admitting: Internal Medicine

## 2010-12-18 LAB — DIFFERENTIAL
Basophils Absolute: 0 10*3/uL (ref 0.0–0.1)
Basophils Absolute: 0 10*3/uL (ref 0.0–0.1)
Basophils Absolute: 0 10*3/uL (ref 0.0–0.1)
Basophils Absolute: 0 10*3/uL (ref 0.0–0.1)
Basophils Relative: 0 % (ref 0–1)
Basophils Relative: 0 % (ref 0–1)
Basophils Relative: 0 % (ref 0–1)
Eosinophils Absolute: 0 10*3/uL (ref 0.0–0.7)
Eosinophils Absolute: 0 10*3/uL (ref 0.0–0.7)
Eosinophils Absolute: 0 10*3/uL (ref 0.0–0.7)
Eosinophils Relative: 1 % (ref 0–5)
Eosinophils Relative: 2 % (ref 0–5)
Eosinophils Relative: 0 % (ref 0–5)
Lymphocytes Relative: 16 % (ref 12–46)
Lymphocytes Relative: 16 % (ref 12–46)
Lymphocytes Relative: 21 % (ref 12–46)
Lymphocytes Relative: 9 % — ABNORMAL LOW (ref 12–46)
Lymphs Abs: 0.7 10*3/uL (ref 0.7–4.0)
Lymphs Abs: 0.8 10*3/uL (ref 0.7–4.0)
Lymphs Abs: 0.9 10*3/uL (ref 0.7–4.0)
Monocytes Absolute: 0.1 10*3/uL (ref 0.1–1.0)
Monocytes Absolute: 0.2 10*3/uL (ref 0.1–1.0)
Monocytes Absolute: 0.1 10*3/uL (ref 0.1–1.0)
Monocytes Relative: 4 % (ref 3–12)
Neutro Abs: 3.1 10*3/uL (ref 1.7–7.7)
Neutro Abs: 4.8 10*3/uL (ref 1.7–7.7)
Neutrophils Relative %: 78 % — ABNORMAL HIGH (ref 43–77)
Neutrophils Relative %: 80 % — ABNORMAL HIGH (ref 43–77)

## 2010-12-18 LAB — COMPREHENSIVE METABOLIC PANEL
ALT: 10 U/L (ref 0–35)
ALT: 10 U/L (ref 0–35)
ALT: 13 U/L (ref 0–35)
ALT: 15 U/L (ref 0–35)
ALT: 17 U/L (ref 0–35)
AST: 18 U/L (ref 0–37)
AST: 19 U/L (ref 0–37)
AST: 20 U/L (ref 0–37)
AST: 22 U/L (ref 0–37)
Albumin: 2.7 g/dL — ABNORMAL LOW (ref 3.5–5.2)
Albumin: 3.2 g/dL — ABNORMAL LOW (ref 3.5–5.2)
Albumin: 3.4 g/dL — ABNORMAL LOW (ref 3.5–5.2)
Alkaline Phosphatase: 48 U/L (ref 39–117)
Alkaline Phosphatase: 54 U/L (ref 39–117)
Alkaline Phosphatase: 62 U/L (ref 39–117)
Alkaline Phosphatase: 63 U/L (ref 39–117)
BUN: 9 mg/dL (ref 6–23)
CO2: 24 mEq/L (ref 19–32)
CO2: 25 mEq/L (ref 19–32)
CO2: 21 mEq/L (ref 19–32)
CO2: 21 mEq/L (ref 19–32)
CO2: 28 mEq/L (ref 19–32)
Calcium: 8.9 mg/dL (ref 8.4–10.5)
Calcium: 9 mg/dL (ref 8.4–10.5)
Calcium: 8.6 mg/dL (ref 8.4–10.5)
Chloride: 105 mEq/L (ref 96–112)
Chloride: 107 mEq/L (ref 96–112)
Creatinine, Ser: 0.58 mg/dL (ref 0.4–1.2)
Creatinine, Ser: 0.67 mg/dL (ref 0.4–1.2)
GFR calc Af Amer: >60 mL/min (ref >60)
GFR calc Af Amer: >60 mL/min (ref >60)
GFR calc Af Amer: >60 mL/min (ref >60)
GFR calc non Af Amer: 58 mL/min — ABNORMAL LOW (ref >60)
GFR calc non Af Amer: >60 mL/min (ref >60)
GFR calc non Af Amer: >60 mL/min (ref >60)
GFR calc non Af Amer: >60 mL/min (ref >60)
GFR calc non Af Amer: >60 mL/min (ref >60)
Glucose, Bld: 102 mg/dL — ABNORMAL HIGH (ref 70–99)
Glucose, Bld: 83 mg/dL (ref 70–99)
Potassium: 2.9 mEq/L — ABNORMAL LOW (ref 3.5–5.1)
Potassium: 2.9 mEq/L — ABNORMAL LOW (ref 3.5–5.1)
Potassium: 3.6 mEq/L (ref 3.5–5.1)
Sodium: 137 mEq/L (ref 135–145)
Sodium: 138 mEq/L (ref 135–145)
Sodium: 139 mEq/L (ref 135–145)
Sodium: 141 mEq/L (ref 135–145)
Sodium: 141 mEq/L (ref 135–145)
Total Bilirubin: 0.5 mg/dL (ref 0.3–1.2)
Total Bilirubin: 0.7 mg/dL (ref 0.3–1.2)
Total Protein: 5.3 g/dL — ABNORMAL LOW (ref 6.0–8.3)
Total Protein: 6.4 g/dL (ref 6.0–8.3)

## 2010-12-18 LAB — BASIC METABOLIC PANEL
BUN: 13 mg/dL (ref 6–23)
BUN: 7 mg/dL (ref 6–23)
BUN: 8 mg/dL (ref 6–23)
CO2: 28 mEq/L (ref 19–32)
Calcium: 8.1 mg/dL — ABNORMAL LOW (ref 8.4–10.5)
Calcium: 8.4 mg/dL (ref 8.4–10.5)
Calcium: 8.4 mg/dL (ref 8.4–10.5)
Calcium: 9.1 mg/dL (ref 8.4–10.5)
Chloride: 103 mEq/L (ref 96–112)
Creatinine, Ser: 0.56 mg/dL (ref 0.4–1.2)
Creatinine, Ser: 0.57 mg/dL (ref 0.4–1.2)
Creatinine, Ser: 0.66 mg/dL (ref 0.4–1.2)
Creatinine, Ser: 0.71 mg/dL (ref 0.4–1.2)
GFR calc Af Amer: >60 mL/min (ref >60)
GFR calc Af Amer: >60 mL/min (ref >60)
GFR calc Af Amer: >60 mL/min (ref >60)
GFR calc non Af Amer: >60 mL/min (ref >60)
GFR calc non Af Amer: >60 mL/min (ref >60)
GFR calc non Af Amer: >60 mL/min (ref >60)
Glucose, Bld: 106 mg/dL — ABNORMAL HIGH (ref 70–99)
Glucose, Bld: 95 mg/dL (ref 70–99)
Glucose, Bld: 97 mg/dL (ref 70–99)
Sodium: 137 mEq/L (ref 135–145)
Sodium: 138 mEq/L (ref 135–145)
Sodium: 138 mEq/L (ref 135–145)
Sodium: 143 mEq/L (ref 135–145)

## 2010-12-18 LAB — URINALYSIS, ROUTINE W REFLEX MICROSCOPIC
Glucose, UA: NEGATIVE mg/dL
Hgb urine dipstick: NEGATIVE
Ketones, ur: 15 mg/dL — AB
Ketones, ur: NEGATIVE mg/dL
Nitrite: NEGATIVE
Protein, ur: 100 mg/dL — AB
Protein, ur: >300 mg/dL — AB
Specific Gravity, Urine: 1.03 (ref 1.005–1.030)
Urobilinogen, UA: 1 mg/dL (ref 0.0–1.0)
pH: 6 (ref 5.0–8.0)

## 2010-12-18 LAB — CBC
HCT: 28.1 % — ABNORMAL LOW (ref 36.0–46.0)
HCT: 31.1 % — ABNORMAL LOW (ref 36.0–46.0)
HCT: 31.5 % — ABNORMAL LOW (ref 36.0–46.0)
HCT: 30.6 % — ABNORMAL LOW (ref 36.0–46.0)
HCT: 31.5 % — ABNORMAL LOW (ref 36.0–46.0)
Hemoglobin: 10.5 g/dL — ABNORMAL LOW (ref 12.0–15.0)
Hemoglobin: 10.6 g/dL — ABNORMAL LOW (ref 12.0–15.0)
Hemoglobin: 11.1 g/dL — ABNORMAL LOW (ref 12.0–15.0)
Hemoglobin: 11.3 g/dL — ABNORMAL LOW (ref 12.0–15.0)
Hemoglobin: 11.8 g/dL — ABNORMAL LOW (ref 12.0–15.0)
Hemoglobin: 9.4 g/dL — ABNORMAL LOW (ref 12.0–15.0)
Hemoglobin: 9.5 g/dL — ABNORMAL LOW (ref 12.0–15.0)
Hemoglobin: 9.8 g/dL — ABNORMAL LOW (ref 12.0–15.0)
MCH: 27.3 pg (ref 26.0–34.0)
MCH: 27.3 pg (ref 26.0–34.0)
MCH: 28.1 pg (ref 26.0–34.0)
MCH: 28.3 pg (ref 26.0–34.0)
MCH: 28.5 pg (ref 26.0–34.0)
MCH: 28.6 pg (ref 26.0–34.0)
MCH: 28.8 pg (ref 26.0–34.0)
MCHC: 32.4 g/dL (ref 30.0–36.0)
MCHC: 32.5 g/dL (ref 30.0–36.0)
MCHC: 32.7 g/dL (ref 30.0–36.0)
MCHC: 33.1 g/dL (ref 30.0–36.0)
MCHC: 33.5 g/dL (ref 30.0–36.0)
MCHC: 33.7 g/dL (ref 30.0–36.0)
MCHC: 33.7 g/dL (ref 30.0–36.0)
MCHC: 34.2 g/dL (ref 30.0–36.0)
MCV: 83.8 fL (ref 78.0–100.0)
MCV: 84.4 fL (ref 78.0–100.0)
MCV: 84.7 fL (ref 78.0–100.0)
MCV: 84.8 fL (ref 78.0–100.0)
MCV: 84.9 fL (ref 78.0–100.0)
MCV: 86.1 fL (ref 78.0–100.0)
Platelets: 210 10*3/uL (ref 150–400)
Platelets: 302 10*3/uL (ref 150–400)
Platelets: 380 10*3/uL (ref 150–400)
Platelets: 463 10*3/uL — ABNORMAL HIGH (ref 150–400)
RBC: 3.26 MIL/uL — ABNORMAL LOW (ref 3.87–5.11)
RBC: 3.46 MIL/uL — ABNORMAL LOW (ref 3.87–5.11)
RBC: 3.67 MIL/uL — ABNORMAL LOW (ref 3.87–5.11)
RBC: 4.1 MIL/uL (ref 3.87–5.11)
RBC: 3.95 MIL/uL (ref 3.87–5.11)
RDW: 14.7 % (ref 11.5–15.5)
RDW: 14.7 % (ref 11.5–15.5)
RDW: 15 % (ref 11.5–15.5)
RDW: 15.2 % (ref 11.5–15.5)
RDW: 15.3 % (ref 11.5–15.5)
RDW: 15.5 % (ref 11.5–15.5)
WBC: 3.1 10*3/uL — ABNORMAL LOW (ref 4.0–10.5)
WBC: 3.3 10*3/uL — ABNORMAL LOW (ref 4.0–10.5)
WBC: 4.3 10*3/uL (ref 4.0–10.5)
WBC: 4.6 10*3/uL (ref 4.0–10.5)
WBC: 4.9 10*3/uL (ref 4.0–10.5)
WBC: 6 10*3/uL (ref 4.0–10.5)
WBC: 8.2 10*3/uL (ref 4.0–10.5)
WBC: 9 10*3/uL (ref 4.0–10.5)

## 2010-12-18 LAB — RAPID URINE DRUG SCREEN, HOSP PERFORMED
Amphetamines: NOT DETECTED
Barbiturates: NOT DETECTED
Barbiturates: NOT DETECTED
Benzodiazepines: NOT DETECTED
Benzodiazepines: NOT DETECTED
Cocaine: POSITIVE — AB
Cocaine: POSITIVE — AB
Cocaine: POSITIVE — AB
Opiates: POSITIVE — AB
Opiates: POSITIVE — AB
Tetrahydrocannabinol: NOT DETECTED

## 2010-12-18 LAB — CULTURE, BLOOD (ROUTINE X 2)
Culture  Setup Time: 201109220131
Culture  Setup Time: 201109220131
Culture  Setup Time: 201110040018
Culture: NO GROWTH
Culture: NO GROWTH

## 2010-12-18 LAB — URINALYSIS, MICROSCOPIC ONLY
Bilirubin Urine: NEGATIVE
Ketones, ur: NEGATIVE mg/dL
Nitrite: NEGATIVE
Specific Gravity, Urine: 1.012 (ref 1.005–1.030)
pH: 6.5 (ref 5.0–8.0)

## 2010-12-18 LAB — CARDIAC PANEL(CRET KIN+CKTOT+MB+TROPI)
CK, MB: 1.2 ng/mL (ref 0.3–4.0)
CK, MB: 2.1 ng/mL (ref 0.3–4.0)
CK, MB: 3.2 ng/mL (ref 0.3–4.0)
Relative Index: 2.5 (ref 0.0–2.5)
Relative Index: INVALID (ref 0.0–2.5)
Total CK: 115 U/L (ref 7–177)
Total CK: 129 U/L (ref 7–177)
Total CK: 92 U/L (ref 7–177)
Total CK: 96 U/L (ref 7–177)
Troponin I: 1.54 ng/mL (ref 0.00–0.06)

## 2010-12-18 LAB — URINE MICROSCOPIC-ADD ON

## 2010-12-18 LAB — URINE CULTURE
Colony Count: >=100000
Culture  Setup Time: 201109160810
Culture  Setup Time: 201109211825
Culture: NO GROWTH
Special Requests: NEGATIVE

## 2010-12-18 LAB — MRSA PCR SCREENING: MRSA by PCR: NEGATIVE

## 2010-12-18 LAB — SEDIMENTATION RATE: Sed Rate: 37 mm/hr — ABNORMAL HIGH (ref 0–22)

## 2010-12-18 LAB — PROCALCITONIN: Procalcitonin: <0.1 ng/mL

## 2010-12-18 LAB — LACTIC ACID, PLASMA: Lactic Acid, Venous: 1.4 mmol/L (ref 0.5–2.2)

## 2010-12-18 LAB — VITAMIN B12: Vitamin B-12: 1178 pg/mL — ABNORMAL HIGH (ref 211–911)

## 2010-12-19 LAB — BASIC METABOLIC PANEL
BUN: 10 mg/dL (ref 6–23)
BUN: 10 mg/dL (ref 6–23)
BUN: 11 mg/dL (ref 6–23)
BUN: 6 mg/dL (ref 6–23)
BUN: 7 mg/dL (ref 6–23)
CO2: 24 mEq/L (ref 19–32)
Calcium: 8.7 mg/dL (ref 8.4–10.5)
Calcium: 9 mg/dL (ref 8.4–10.5)
Calcium: 9.3 mg/dL (ref 8.4–10.5)
Calcium: 9.5 mg/dL (ref 8.4–10.5)
Creatinine, Ser: 0.65 mg/dL (ref 0.4–1.2)
Creatinine, Ser: 0.7 mg/dL (ref 0.4–1.2)
Creatinine, Ser: 0.75 mg/dL (ref 0.4–1.2)
GFR calc non Af Amer: >60 mL/min (ref >60)
GFR calc non Af Amer: >60 mL/min (ref >60)
GFR calc non Af Amer: >60 mL/min (ref >60)
GFR calc non Af Amer: >60 mL/min (ref >60)
GFR calc non Af Amer: >60 mL/min (ref >60)
Glucose, Bld: 105 mg/dL — ABNORMAL HIGH (ref 70–99)
Glucose, Bld: 110 mg/dL — ABNORMAL HIGH (ref 70–99)
Glucose, Bld: 89 mg/dL (ref 70–99)
Glucose, Bld: 91 mg/dL (ref 70–99)
Glucose, Bld: 97 mg/dL (ref 70–99)
Potassium: 3.8 mEq/L (ref 3.5–5.1)
Sodium: 137 mEq/L (ref 135–145)
Sodium: 138 mEq/L (ref 135–145)
Sodium: 138 mEq/L (ref 135–145)

## 2010-12-19 LAB — CBC
HCT: 32.1 % — ABNORMAL LOW (ref 36.0–46.0)
HCT: 32.9 % — ABNORMAL LOW (ref 36.0–46.0)
HCT: 33.3 % — ABNORMAL LOW (ref 36.0–46.0)
HCT: 34 % — ABNORMAL LOW (ref 36.0–46.0)
Hemoglobin: 11.2 g/dL — ABNORMAL LOW (ref 12.0–15.0)
Hemoglobin: 11.3 g/dL — ABNORMAL LOW (ref 12.0–15.0)
Hemoglobin: 11.4 g/dL — ABNORMAL LOW (ref 12.0–15.0)
Hemoglobin: 9.7 g/dL — ABNORMAL LOW (ref 12.0–15.0)
MCH: 27.7 pg (ref 26.0–34.0)
MCH: 27.7 pg (ref 26.0–34.0)
MCH: 28.4 pg (ref 26.0–34.0)
MCH: 28.9 pg (ref 26.0–34.0)
MCHC: 32.6 g/dL (ref 30.0–36.0)
MCHC: 32.8 g/dL (ref 30.0–36.0)
MCHC: 33 g/dL (ref 30.0–36.0)
MCHC: 33.5 g/dL (ref 30.0–36.0)
MCHC: 33.6 g/dL (ref 30.0–36.0)
MCHC: 33.6 g/dL (ref 30.0–36.0)
MCHC: 33.8 g/dL (ref 30.0–36.0)
MCV: 82.4 fL (ref 78.0–100.0)
MCV: 84.8 fL (ref 78.0–100.0)
MCV: 85.2 fL (ref 78.0–100.0)
MCV: 85.5 fL (ref 78.0–100.0)
MCV: 85.6 fL (ref 78.0–100.0)
Platelets: 209 10*3/uL (ref 150–400)
Platelets: 210 10*3/uL (ref 150–400)
Platelets: 213 10*3/uL (ref 150–400)
Platelets: 217 10*3/uL (ref 150–400)
Platelets: 221 K/uL (ref 150–400)
Platelets: 286 K/uL (ref 150–400)
RBC: 4.01 MIL/uL (ref 3.87–5.11)
RBC: 4.04 MIL/uL (ref 3.87–5.11)
RDW: 15.1 % (ref 11.5–15.5)
RDW: 15.3 % (ref 11.5–15.5)
RDW: 15.4 % (ref 11.5–15.5)
RDW: 15.8 % — ABNORMAL HIGH (ref 11.5–15.5)
RDW: 15.8 % — ABNORMAL HIGH (ref 11.5–15.5)
RDW: 15.9 % — ABNORMAL HIGH (ref 11.5–15.5)
WBC: 2.9 K/uL — ABNORMAL LOW (ref 4.0–10.5)
WBC: 3.1 K/uL — ABNORMAL LOW (ref 4.0–10.5)
WBC: 3.6 10*3/uL — ABNORMAL LOW (ref 4.0–10.5)

## 2010-12-19 LAB — VIRUS CULTURE: Preliminary Culture: NEGATIVE

## 2010-12-19 LAB — BASIC METABOLIC PANEL WITH GFR
BUN: 8 mg/dL (ref 6–23)
CO2: 25 meq/L (ref 19–32)
Calcium: 9.6 mg/dL (ref 8.4–10.5)
Chloride: 102 meq/L (ref 96–112)
Creatinine, Ser: 0.6 mg/dL (ref 0.4–1.2)
GFR calc Af Amer: >60 mL/min (ref >60)
GFR calc non Af Amer: >60 mL/min (ref >60)
Glucose, Bld: 118 mg/dL — ABNORMAL HIGH (ref 70–99)
Potassium: 4.3 meq/L (ref 3.5–5.1)
Sodium: 136 meq/L (ref 135–145)

## 2010-12-19 LAB — WOUND CULTURE

## 2010-12-19 LAB — DRUGS OF ABUSE SCREEN W/O ALC, ROUTINE URINE
Amphetamine Screen, Ur: NEGATIVE
Barbiturate Quant, Ur: NEGATIVE
Benzodiazepines.: NEGATIVE
Methadone: NEGATIVE
Opiate Screen, Urine: NEGATIVE
Phencyclidine (PCP): NEGATIVE

## 2010-12-19 LAB — COMPREHENSIVE METABOLIC PANEL
AST: 32 U/L (ref 0–37)
Albumin: 3.2 g/dL — ABNORMAL LOW (ref 3.5–5.2)
BUN: 10 mg/dL (ref 6–23)
CO2: 21 mEq/L (ref 19–32)
Calcium: 8.8 mg/dL (ref 8.4–10.5)
Creatinine, Ser: 0.76 mg/dL (ref 0.4–1.2)
GFR calc Af Amer: >60 mL/min (ref >60)
GFR calc non Af Amer: >60 mL/min (ref >60)

## 2010-12-19 LAB — DIFFERENTIAL
Basophils Absolute: 0 10*3/uL (ref 0.0–0.1)
Eosinophils Relative: 0 % (ref 0–5)
Lymphocytes Relative: 8 % — ABNORMAL LOW (ref 12–46)
Lymphs Abs: 0.3 10*3/uL — ABNORMAL LOW (ref 0.7–4.0)
Neutro Abs: 3.7 10*3/uL (ref 1.7–7.7)

## 2010-12-19 LAB — ANTI-DNA ANTIBODY, DOUBLE-STRANDED: ds DNA Ab: 3 IU/mL (ref <30)

## 2010-12-19 LAB — SEDIMENTATION RATE: Sed Rate: 47 mm/hr — ABNORMAL HIGH (ref 0–22)

## 2010-12-19 LAB — ANA

## 2010-12-20 ENCOUNTER — Emergency Department (HOSPITAL_COMMUNITY)
Admission: EM | Admit: 2010-12-20 | Discharge: 2010-12-20 | Disposition: A | Discharge status: Home / Self Care | Payer: Medicaid Other | Attending: Emergency Medicine | Admitting: Emergency Medicine

## 2010-12-20 DIAGNOSIS — I10 Essential (primary) hypertension: Secondary | ICD-10-CM | POA: Insufficient documentation

## 2010-12-20 DIAGNOSIS — F329 Major depressive disorder, single episode, unspecified: Secondary | ICD-10-CM | POA: Insufficient documentation

## 2010-12-20 DIAGNOSIS — Z79899 Other long term (current) drug therapy: Secondary | ICD-10-CM | POA: Insufficient documentation

## 2010-12-20 DIAGNOSIS — H9209 Otalgia, unspecified ear: Secondary | ICD-10-CM | POA: Insufficient documentation

## 2010-12-20 DIAGNOSIS — G8929 Other chronic pain: Secondary | ICD-10-CM | POA: Insufficient documentation

## 2010-12-20 DIAGNOSIS — H9210 Otorrhea, unspecified ear: Secondary | ICD-10-CM | POA: Insufficient documentation

## 2010-12-20 DIAGNOSIS — F3289 Other specified depressive episodes: Secondary | ICD-10-CM | POA: Insufficient documentation

## 2010-12-20 DIAGNOSIS — H60399 Other infective otitis externa, unspecified ear: Secondary | ICD-10-CM | POA: Insufficient documentation

## 2010-12-20 LAB — BASIC METABOLIC PANEL
BUN: 13 mg/dL (ref 6–23)
BUN: 8 mg/dL (ref 6–23)
BUN: 8 mg/dL (ref 6–23)
CO2: 26 mEq/L (ref 19–32)
CO2: 26 mEq/L (ref 19–32)
Calcium: 8.7 mg/dL (ref 8.4–10.5)
Chloride: 102 mEq/L (ref 96–112)
Creatinine, Ser: 0.5 mg/dL (ref 0.4–1.2)
GFR calc non Af Amer: >60 mL/min (ref >60)
GFR calc non Af Amer: >60 mL/min (ref >60)
Glucose, Bld: 103 mg/dL — ABNORMAL HIGH (ref 70–99)
Glucose, Bld: 122 mg/dL — ABNORMAL HIGH (ref 70–99)
Potassium: 3.5 mEq/L (ref 3.5–5.1)
Potassium: 3.6 mEq/L (ref 3.5–5.1)
Potassium: 3.6 mEq/L (ref 3.5–5.1)

## 2010-12-20 LAB — CBC
HCT: 26.9 % — ABNORMAL LOW (ref 36.0–46.0)
HCT: 28.4 % — ABNORMAL LOW (ref 36.0–46.0)
HCT: 31.1 % — ABNORMAL LOW (ref 36.0–46.0)
HCT: 32.9 % — ABNORMAL LOW (ref 36.0–46.0)
Hemoglobin: 10.5 g/dL — ABNORMAL LOW (ref 12.0–15.0)
Hemoglobin: 11.1 g/dL — ABNORMAL LOW (ref 12.0–15.0)
Hemoglobin: 9.6 g/dL — ABNORMAL LOW (ref 12.0–15.0)
MCH: 28.4 pg (ref 26.0–34.0)
MCH: 28.4 pg (ref 26.0–34.0)
MCH: 28.8 pg (ref 26.0–34.0)
MCH: 29.1 pg (ref 26.0–34.0)
MCHC: 33.7 g/dL (ref 30.0–36.0)
MCHC: 33.7 g/dL (ref 30.0–36.0)
MCHC: 33.7 g/dL (ref 30.0–36.0)
MCHC: 33.8 g/dL (ref 30.0–36.0)
MCV: 84.2 fL (ref 78.0–100.0)
MCV: 84.6 fL (ref 78.0–100.0)
MCV: 84.6 fL (ref 78.0–100.0)
MCV: 85.3 fL (ref 78.0–100.0)
Platelets: 323 10*3/uL (ref 150–400)
Platelets: 490 10*3/uL — ABNORMAL HIGH (ref 150–400)
Platelets: 493 10*3/uL — ABNORMAL HIGH (ref 150–400)
Platelets: 513 10*3/uL — ABNORMAL HIGH (ref 150–400)
RBC: 3.59 MIL/uL — ABNORMAL LOW (ref 3.87–5.11)
RDW: 15.1 % (ref 11.5–15.5)
RDW: 16.2 % — ABNORMAL HIGH (ref 11.5–15.5)
RDW: 16.6 % — ABNORMAL HIGH (ref 11.5–15.5)
RDW: 16.7 % — ABNORMAL HIGH (ref 11.5–15.5)
RDW: 17 % — ABNORMAL HIGH (ref 11.5–15.5)
WBC: 4 10*3/uL (ref 4.0–10.5)
WBC: 7.5 10*3/uL (ref 4.0–10.5)
WBC: 8.9 10*3/uL (ref 4.0–10.5)
WBC: 9.5 10*3/uL (ref 4.0–10.5)

## 2010-12-20 LAB — ANAEROBIC CULTURE

## 2010-12-20 LAB — SEDIMENTATION RATE: Sed Rate: 61 mm/hr — ABNORMAL HIGH (ref 0–22)

## 2010-12-20 LAB — DIFFERENTIAL
Basophils Absolute: 0 10*3/uL (ref 0.0–0.1)
Basophils Absolute: 0.1 10*3/uL (ref 0.0–0.1)
Basophils Relative: 0 % (ref 0–1)
Basophils Relative: 1 % (ref 0–1)
Eosinophils Absolute: 0.1 10*3/uL (ref 0.0–0.7)
Eosinophils Absolute: 0 10*3/uL (ref 0.0–0.7)
Eosinophils Absolute: 0.1 10*3/uL (ref 0.0–0.7)
Eosinophils Relative: 0 % (ref 0–5)
Lymphocytes Relative: 25 % (ref 12–46)
Monocytes Absolute: 0.5 10*3/uL (ref 0.1–1.0)
Monocytes Absolute: 0.6 10*3/uL (ref 0.1–1.0)
Monocytes Relative: 6 % (ref 3–12)
Monocytes Relative: 7 % (ref 3–12)
Neutrophils Relative %: 68 % (ref 43–77)
Neutrophils Relative %: 67 % (ref 43–77)

## 2010-12-20 LAB — ANTIPHOSPHOLIPID SYNDROME EVAL, BLD
Anticardiolipin IgA: 5 APL U/mL — ABNORMAL LOW (ref <22)
Drvvt confirmation: 1.41 Ratio — ABNORMAL HIGH (ref <1.21)
PTT Lupus Anticoagulant: 34.9 secs (ref 30.0–45.6)
Phosphatydalserine, IgM: >100 U/mL — ABNORMAL HIGH (ref <25)
dRVVT Incubated 1:1 Mix: 49.9 secs — ABNORMAL HIGH (ref 36.2–44.3)

## 2010-12-20 LAB — POCT I-STAT, CHEM 8
HCT: 36 % (ref 36.0–46.0)
Hemoglobin: 12.2 g/dL (ref 12.0–15.0)
Potassium: 4.2 mEq/L (ref 3.5–5.1)
Sodium: 137 mEq/L (ref 135–145)

## 2010-12-20 LAB — URINALYSIS, ROUTINE W REFLEX MICROSCOPIC
Hgb urine dipstick: NEGATIVE
Ketones, ur: NEGATIVE mg/dL
Protein, ur: NEGATIVE mg/dL
Urobilinogen, UA: 2 mg/dL — ABNORMAL HIGH (ref 0.0–1.0)

## 2010-12-20 LAB — MRSA PCR SCREENING: MRSA by PCR: NEGATIVE

## 2010-12-20 LAB — WOUND CULTURE
Culture: NO GROWTH
Culture: NO GROWTH

## 2010-12-20 LAB — DRUGS OF ABUSE SCREEN W/O ALC, ROUTINE URINE
Amphetamine Screen, Ur: NEGATIVE
Barbiturate Quant, Ur: NEGATIVE
Benzodiazepines.: NEGATIVE
Marijuana Metabolite: NEGATIVE
Methadone: NEGATIVE
Phencyclidine (PCP): NEGATIVE

## 2010-12-20 LAB — PROTIME-INR: INR: 1.17 (ref 0.00–1.49)

## 2010-12-20 LAB — AFB CULTURE, BLOOD

## 2010-12-20 LAB — OPIATE, QUANTITATIVE, URINE
Codeine Urine: NEGATIVE NG/ML
Hydromorphone GC/MS Conf: NEGATIVE NG/ML
Morphine, Confirm: 739 NG/ML — ABNORMAL HIGH

## 2010-12-20 LAB — GRAM STAIN

## 2010-12-20 LAB — URINE CULTURE: Colony Count: 9000

## 2010-12-20 LAB — RAPID URINE DRUG SCREEN, HOSP PERFORMED
Amphetamines: NOT DETECTED
Barbiturates: NOT DETECTED
Opiates: POSITIVE — AB

## 2010-12-20 LAB — APTT: aPTT: 28 seconds (ref 24–37)

## 2010-12-20 LAB — MISCELLANEOUS TEST

## 2010-12-20 LAB — QUANTIFERON TB GOLD ASSAY (BLOOD)

## 2010-12-20 LAB — URINE MICROSCOPIC-ADD ON

## 2010-12-20 LAB — CULTURE, BLOOD (ROUTINE X 2): Culture: NO GROWTH

## 2010-12-21 LAB — IRON AND TIBC
Iron: 19 ug/dL — ABNORMAL LOW (ref 42–135)
Saturation Ratios: 8 % — ABNORMAL LOW (ref 20–55)
TIBC: 238 ug/dL — ABNORMAL LOW (ref 250–470)
UIBC: 219 ug/dL

## 2010-12-21 LAB — CBC
HCT: 34.2 % — ABNORMAL LOW (ref 36.0–46.0)
HCT: 35.7 % — ABNORMAL LOW (ref 36.0–46.0)
HCT: 38.4 % (ref 36.0–46.0)
Hemoglobin: 11.9 g/dL — ABNORMAL LOW (ref 12.0–15.0)
Hemoglobin: 12.9 g/dL (ref 12.0–15.0)
MCH: 29.1 pg (ref 26.0–34.0)
MCHC: 34.7 g/dL (ref 30.0–36.0)
MCHC: 35 g/dL (ref 30.0–36.0)
MCHC: 35.2 g/dL (ref 30.0–36.0)
MCV: 83 fL (ref 78.0–100.0)
MCV: 84.4 fL (ref 78.0–100.0)
MCV: 84.6 fL (ref 78.0–100.0)
MCV: 85.4 fL (ref 78.0–100.0)
MCV: 85.5 fL (ref 78.0–100.0)
MCV: 87.2 fL (ref 78.0–100.0)
MCV: 84.1 fL (ref 78.0–100.0)
Platelets: 261 10*3/uL (ref 150–400)
Platelets: 350 10*3/uL (ref 150–400)
Platelets: 383 10*3/uL (ref 150–400)
Platelets: 296 10*3/uL (ref 150–400)
RBC: 3.86 MIL/uL — ABNORMAL LOW (ref 3.87–5.11)
RBC: 4.04 MIL/uL (ref 3.87–5.11)
RBC: 3.49 MIL/uL — ABNORMAL LOW (ref 3.87–5.11)
RDW: 17.6 % — ABNORMAL HIGH (ref 11.5–15.5)
RDW: 18 % — ABNORMAL HIGH (ref 11.5–15.5)
RDW: 18.4 % — ABNORMAL HIGH (ref 11.5–15.5)
RDW: 18.9 % — ABNORMAL HIGH (ref 11.5–15.5)
RDW: 16.6 % — ABNORMAL HIGH (ref 11.5–15.5)
RDW: 19.7 % — ABNORMAL HIGH (ref 11.5–15.5)
WBC: 3.4 10*3/uL — ABNORMAL LOW (ref 4.0–10.5)
WBC: 4.9 10*3/uL (ref 4.0–10.5)
WBC: 6.1 10*3/uL (ref 4.0–10.5)

## 2010-12-21 LAB — COMPREHENSIVE METABOLIC PANEL
ALT: 12 U/L (ref 0–35)
AST: 18 U/L (ref 0–37)
Albumin: 3 g/dL — ABNORMAL LOW (ref 3.5–5.2)
Albumin: 3.2 g/dL — ABNORMAL LOW (ref 3.5–5.2)
Alkaline Phosphatase: 61 U/L (ref 39–117)
Alkaline Phosphatase: 73 U/L (ref 39–117)
Alkaline Phosphatase: 91 U/L (ref 39–117)
BUN: 12 mg/dL (ref 6–23)
BUN: 14 mg/dL (ref 6–23)
BUN: 8 mg/dL (ref 6–23)
BUN: 15 mg/dL (ref 6–23)
CO2: 25 mEq/L (ref 19–32)
Calcium: 9.2 mg/dL (ref 8.4–10.5)
Chloride: 103 mEq/L (ref 96–112)
Chloride: 104 mEq/L (ref 96–112)
Creatinine, Ser: 0.73 mg/dL (ref 0.4–1.2)
Creatinine, Ser: 0.72 mg/dL (ref 0.4–1.2)
GFR calc Af Amer: >60 mL/min (ref >60)
GFR calc non Af Amer: >60 mL/min (ref >60)
Glucose, Bld: 130 mg/dL — ABNORMAL HIGH (ref 70–99)
Glucose, Bld: 97 mg/dL (ref 70–99)
Glucose, Bld: 98 mg/dL (ref 70–99)
Potassium: 4.2 mEq/L (ref 3.5–5.1)
Potassium: 4 mEq/L (ref 3.5–5.1)
Sodium: 137 mEq/L (ref 135–145)
Total Bilirubin: 0.8 mg/dL (ref 0.3–1.2)
Total Protein: 6.3 g/dL (ref 6.0–8.3)
Total Protein: 6.9 g/dL (ref 6.0–8.3)
Total Protein: 7.4 g/dL (ref 6.0–8.3)

## 2010-12-21 LAB — URINE CULTURE
Colony Count: 60000
Colony Count: NO GROWTH

## 2010-12-21 LAB — CULTURE, BLOOD (ROUTINE X 2)
Culture: NO GROWTH
Culture: NO GROWTH
Culture: NO GROWTH
Culture: NO GROWTH
Culture: NO GROWTH

## 2010-12-21 LAB — URINALYSIS, MICROSCOPIC ONLY
Glucose, UA: NEGATIVE mg/dL
Hgb urine dipstick: NEGATIVE
Ketones, ur: NEGATIVE mg/dL
Ketones, ur: NEGATIVE mg/dL
Leukocytes, UA: NEGATIVE
Nitrite: NEGATIVE
Nitrite: NEGATIVE
Protein, ur: NEGATIVE mg/dL
Protein, ur: NEGATIVE mg/dL
Specific Gravity, Urine: 1.029 (ref 1.005–1.030)
pH: 6 (ref 5.0–8.0)
pH: 6 (ref 5.0–8.0)

## 2010-12-21 LAB — BASIC METABOLIC PANEL
BUN: 15 mg/dL (ref 6–23)
BUN: 11 mg/dL (ref 6–23)
CO2: 25 mEq/L (ref 19–32)
CO2: 27 mEq/L (ref 19–32)
Calcium: 8.8 mg/dL (ref 8.4–10.5)
Chloride: 96 mEq/L (ref 96–112)
Chloride: 98 mEq/L (ref 96–112)
Chloride: 107 mEq/L (ref 96–112)
Creatinine, Ser: 0.74 mg/dL (ref 0.4–1.2)
Creatinine, Ser: 0.71 mg/dL (ref 0.4–1.2)
GFR calc Af Amer: >60 mL/min (ref >60)
GFR calc Af Amer: >60 mL/min (ref >60)
GFR calc non Af Amer: >60 mL/min (ref >60)
Glucose, Bld: 98 mg/dL (ref 70–99)
Potassium: 4.2 mEq/L (ref 3.5–5.1)

## 2010-12-21 LAB — URINALYSIS, ROUTINE W REFLEX MICROSCOPIC
Bilirubin Urine: NEGATIVE
Bilirubin Urine: NEGATIVE
Glucose, UA: NEGATIVE mg/dL
Glucose, UA: NEGATIVE mg/dL
Hgb urine dipstick: NEGATIVE
Ketones, ur: 15 mg/dL — AB
Ketones, ur: NEGATIVE mg/dL
Nitrite: NEGATIVE
Protein, ur: 100 mg/dL — AB
Protein, ur: 100 mg/dL — AB
Specific Gravity, Urine: 1.018 (ref 1.005–1.030)
Urobilinogen, UA: 0.2 mg/dL (ref 0.0–1.0)
pH: 6 (ref 5.0–8.0)
pH: 6.5 (ref 5.0–8.0)

## 2010-12-21 LAB — POCT CARDIAC MARKERS
CKMB, poc: <1 ng/mL — ABNORMAL LOW (ref 1.0–8.0)
Myoglobin, poc: 72.8 ng/mL (ref 12–200)
Myoglobin, poc: 38.6 ng/mL (ref 12–200)

## 2010-12-21 LAB — LIPASE, BLOOD
Lipase: 19 U/L (ref 11–59)
Lipase: 24 U/L (ref 11–59)

## 2010-12-21 LAB — DIFFERENTIAL
Basophils Absolute: 0 10*3/uL (ref 0.0–0.1)
Basophils Absolute: 0 10*3/uL (ref 0.0–0.1)
Basophils Absolute: 0 10*3/uL (ref 0.0–0.1)
Basophils Relative: 0 % (ref 0–1)
Basophils Relative: 0 % (ref 0–1)
Basophils Relative: 0 % (ref 0–1)
Basophils Relative: 0 % (ref 0–1)
Eosinophils Absolute: 0 10*3/uL (ref 0.0–0.7)
Eosinophils Absolute: 0.1 10*3/uL (ref 0.0–0.7)
Eosinophils Relative: 1 % (ref 0–5)
Lymphocytes Relative: 25 % (ref 12–46)
Lymphocytes Relative: 15 % (ref 12–46)
Lymphs Abs: 0.7 10*3/uL (ref 0.7–4.0)
Monocytes Absolute: 0.1 10*3/uL (ref 0.1–1.0)
Monocytes Absolute: 0.1 10*3/uL (ref 0.1–1.0)
Monocytes Relative: 1 % — ABNORMAL LOW (ref 3–12)
Monocytes Relative: 2 % — ABNORMAL LOW (ref 3–12)
Monocytes Relative: 2 % — ABNORMAL LOW (ref 3–12)
Monocytes Relative: 5 % (ref 3–12)
Neutro Abs: 2 10*3/uL (ref 1.7–7.7)
Neutro Abs: 2.4 10*3/uL (ref 1.7–7.7)
Neutro Abs: 2.5 10*3/uL (ref 1.7–7.7)
Neutro Abs: 3.5 10*3/uL (ref 1.7–7.7)
Neutro Abs: 2.5 10*3/uL (ref 1.7–7.7)
Neutrophils Relative %: 72 % (ref 43–77)
Neutrophils Relative %: 78 % — ABNORMAL HIGH (ref 43–77)
Neutrophils Relative %: 77 % (ref 43–77)
Neutrophils Relative %: 83 % — ABNORMAL HIGH (ref 43–77)

## 2010-12-21 LAB — RETICULOCYTES
Retic Count, Absolute: 31 10*3/uL (ref 19.0–186.0)
Retic Ct Pct: 0.8 % (ref 0.4–3.1)

## 2010-12-21 LAB — SEDIMENTATION RATE: Sed Rate: 79 mm/hr — ABNORMAL HIGH (ref 0–22)

## 2010-12-21 LAB — RAPID URINE DRUG SCREEN, HOSP PERFORMED
Barbiturates: NOT DETECTED
Benzodiazepines: NOT DETECTED
Benzodiazepines: NOT DETECTED
Cocaine: POSITIVE — AB
Cocaine: POSITIVE — AB
Tetrahydrocannabinol: NOT DETECTED

## 2010-12-21 LAB — URINE MICROSCOPIC-ADD ON

## 2010-12-21 LAB — POCT PREGNANCY, URINE: Preg Test, Ur: NEGATIVE

## 2010-12-21 LAB — D-DIMER, QUANTITATIVE: D-Dimer, Quant: 4.04 ug/mL-FEU — ABNORMAL HIGH (ref 0.00–0.48)

## 2010-12-21 LAB — VITAMIN B12: Vitamin B-12: 372 pg/mL (ref 211–911)

## 2010-12-21 LAB — PROTIME-INR: Prothrombin Time: 13.7 seconds (ref 11.6–15.2)

## 2010-12-21 LAB — ETHANOL: Alcohol, Ethyl (B): <5 mg/dL (ref 0–10)

## 2010-12-22 LAB — BASIC METABOLIC PANEL
BUN: 14 mg/dL (ref 6–23)
CO2: 27 mEq/L (ref 19–32)
Chloride: 100 mEq/L (ref 96–112)
Chloride: 104 mEq/L (ref 96–112)
Creatinine, Ser: 0.7 mg/dL (ref 0.4–1.2)
GFR calc Af Amer: >60 mL/min (ref >60)
GFR calc Af Amer: >60 mL/min (ref >60)
Potassium: 4.2 mEq/L (ref 3.5–5.1)
Sodium: 137 mEq/L (ref 135–145)

## 2010-12-22 LAB — CBC
HCT: 30.6 % — ABNORMAL LOW (ref 36.0–46.0)
MCHC: 34.7 g/dL (ref 30.0–36.0)
MCV: 85.4 fL (ref 78.0–100.0)
MCV: 86.2 fL (ref 78.0–100.0)
RBC: 3.59 MIL/uL — ABNORMAL LOW (ref 3.87–5.11)
RBC: 3.88 MIL/uL (ref 3.87–5.11)
WBC: 5.9 10*3/uL (ref 4.0–10.5)

## 2010-12-23 LAB — CBC
HCT: 25.1 % — ABNORMAL LOW (ref 36.0–46.0)
HCT: 25.9 % — ABNORMAL LOW (ref 36.0–46.0)
HCT: 24.8 % — ABNORMAL LOW (ref 36.0–46.0)
Hemoglobin: 8.8 g/dL — ABNORMAL LOW (ref 12.0–15.0)
Hemoglobin: 8.9 g/dL — ABNORMAL LOW (ref 12.0–15.0)
MCHC: 34.5 g/dL (ref 30.0–36.0)
MCHC: 34.6 g/dL (ref 30.0–36.0)
MCHC: 35.9 g/dL (ref 30.0–36.0)
MCV: 83.3 fL (ref 78.0–100.0)
MCV: 85.4 fL (ref 78.0–100.0)
MCV: 87.1 fL (ref 78.0–100.0)
MCV: 83.1 fL (ref 78.0–100.0)
MCV: 83.3 fL (ref 78.0–100.0)
MCV: 85.6 fL (ref 78.0–100.0)
Platelets: 349 10*3/uL (ref 150–400)
Platelets: 394 10*3/uL (ref 150–400)
Platelets: 524 10*3/uL — ABNORMAL HIGH (ref 150–400)
Platelets: 320 10*3/uL (ref 150–400)
Platelets: 363 10*3/uL (ref 150–400)
RBC: 2.73 MIL/uL — ABNORMAL LOW (ref 3.87–5.11)
RBC: 3.04 MIL/uL — ABNORMAL LOW (ref 3.87–5.11)
RBC: 2.9 MIL/uL — ABNORMAL LOW (ref 3.87–5.11)
RBC: 3.73 MIL/uL — ABNORMAL LOW (ref 3.87–5.11)
RDW: 15.2 % (ref 11.5–15.5)
RDW: 15.6 % — ABNORMAL HIGH (ref 11.5–15.5)
RDW: 15.2 % (ref 11.5–15.5)
WBC: 5.5 10*3/uL (ref 4.0–10.5)
WBC: 9.1 10*3/uL (ref 4.0–10.5)
WBC: 7.5 10*3/uL (ref 4.0–10.5)

## 2010-12-23 LAB — BASIC METABOLIC PANEL
BUN: 10 mg/dL (ref 6–23)
BUN: 11 mg/dL (ref 6–23)
BUN: 11 mg/dL (ref 6–23)
CO2: 24 mEq/L (ref 19–32)
CO2: 22 mEq/L (ref 19–32)
Calcium: 8.3 mg/dL — ABNORMAL LOW (ref 8.4–10.5)
Calcium: 8.2 mg/dL — ABNORMAL LOW (ref 8.4–10.5)
Chloride: 103 mEq/L (ref 96–112)
Chloride: 108 mEq/L (ref 96–112)
Creatinine, Ser: 0.5 mg/dL (ref 0.4–1.2)
Creatinine, Ser: 0.53 mg/dL (ref 0.4–1.2)
GFR calc Af Amer: >60 mL/min (ref >60)
GFR calc Af Amer: >60 mL/min (ref >60)
GFR calc non Af Amer: >60 mL/min (ref >60)
Glucose, Bld: 101 mg/dL — ABNORMAL HIGH (ref 70–99)
Potassium: 3.6 mEq/L (ref 3.5–5.1)
Potassium: 3.5 mEq/L (ref 3.5–5.1)
Sodium: 137 mEq/L (ref 135–145)

## 2010-12-23 LAB — RAPID URINE DRUG SCREEN, HOSP PERFORMED
Amphetamines: NOT DETECTED
Barbiturates: NOT DETECTED
Opiates: NOT DETECTED

## 2010-12-23 LAB — DIFFERENTIAL
Basophils Absolute: 0 10*3/uL (ref 0.0–0.1)
Eosinophils Absolute: 0 10*3/uL (ref 0.0–0.7)
Eosinophils Relative: 0 % (ref 0–5)
Monocytes Absolute: 0.1 10*3/uL (ref 0.1–1.0)

## 2010-12-23 LAB — POCT I-STAT, CHEM 8
Calcium, Ion: 1.13 mmol/L (ref 1.12–1.32)
Chloride: 104 mEq/L (ref 96–112)
Creatinine, Ser: 0.7 mg/dL (ref 0.4–1.2)
Glucose, Bld: 131 mg/dL — ABNORMAL HIGH (ref 70–99)
HCT: 33 % — ABNORMAL LOW (ref 36.0–46.0)
Hemoglobin: 11.2 g/dL — ABNORMAL LOW (ref 12.0–15.0)
Potassium: 3.7 mEq/L (ref 3.5–5.1)

## 2010-12-23 LAB — ANTI-NEUTROPHIL ANTIBODY

## 2010-12-23 LAB — POCT I-STAT 3, ART BLOOD GAS (G3+)
O2 Saturation: 94 %
pCO2 arterial: 29.3 mmHg — ABNORMAL LOW (ref 35.0–45.0)
pH, Arterial: 7.483 — ABNORMAL HIGH (ref 7.350–7.400)
pO2, Arterial: 63 mmHg — ABNORMAL LOW (ref 80.0–100.0)

## 2010-12-23 LAB — MISCELLANEOUS TEST

## 2010-12-23 LAB — CULTURE, BLOOD (ROUTINE X 2)
Culture: NO GROWTH
Culture: NO GROWTH

## 2010-12-23 LAB — IRON AND TIBC: Iron: 29 ug/dL — ABNORMAL LOW (ref 42–135)

## 2010-12-23 LAB — PROTIME-INR
INR: 1.13 (ref 0.00–1.49)
Prothrombin Time: 14.4 seconds (ref 11.6–15.2)

## 2010-12-23 LAB — WOUND CULTURE: Gram Stain: NONE SEEN

## 2010-12-23 LAB — ANAEROBIC CULTURE

## 2010-12-23 LAB — APTT: aPTT: 28 seconds (ref 24–37)

## 2010-12-23 LAB — URINALYSIS, ROUTINE W REFLEX MICROSCOPIC
Bilirubin Urine: NEGATIVE
Ketones, ur: NEGATIVE mg/dL
Nitrite: NEGATIVE
Specific Gravity, Urine: 1.018 (ref 1.005–1.030)
pH: 6.5 (ref 5.0–8.0)

## 2010-12-23 LAB — COMPREHENSIVE METABOLIC PANEL
ALT: 8 U/L (ref 0–35)
AST: 29 U/L (ref 0–37)
Albumin: 2.2 g/dL — ABNORMAL LOW (ref 3.5–5.2)
Calcium: 8.3 mg/dL — ABNORMAL LOW (ref 8.4–10.5)
Chloride: 103 mEq/L (ref 96–112)
Creatinine, Ser: 0.52 mg/dL (ref 0.4–1.2)
GFR calc Af Amer: >60 mL/min (ref >60)
Sodium: 133 mEq/L — ABNORMAL LOW (ref 135–145)
Total Bilirubin: 0.9 mg/dL (ref 0.3–1.2)

## 2010-12-23 LAB — LUPUS ANTICOAGULANT PANEL
DRVVT: 50.6 secs — ABNORMAL HIGH (ref 36.2–44.3)
Lupus Anticoagulant: DETECTED — AB
dRVVT Incubated 1:1 Mix: 54.7 secs — ABNORMAL HIGH (ref 36.2–44.3)

## 2010-12-23 LAB — VITAMIN B12: Vitamin B-12: 461 pg/mL (ref 211–911)

## 2010-12-23 LAB — URINE MICROSCOPIC-ADD ON

## 2010-12-23 LAB — ANTIPHOSPHOLIPID SYNDROME EVAL, BLD
Anticardiolipin IgG: 4 GPL U/mL — ABNORMAL LOW (ref <23)
Anticardiolipin IgM: 51 MPL U/mL — ABNORMAL HIGH (ref <11)
Phosphatydalserine, IgA: <20 U/mL (ref <20)

## 2010-12-23 LAB — LACTATE DEHYDROGENASE: LDH: 237 U/L (ref 94–250)

## 2010-12-23 LAB — ANA: Anti Nuclear Antibody(ANA): POSITIVE — AB

## 2010-12-23 LAB — RETICULOCYTES: Retic Ct Pct: 1.3 % (ref 0.4–3.1)

## 2010-12-24 LAB — CULTURE, BLOOD (ROUTINE X 2)
Culture: NO GROWTH
Culture: NO GROWTH

## 2010-12-24 LAB — DIFFERENTIAL
Basophils Absolute: 0 10*3/uL (ref 0.0–0.1)
Basophils Absolute: 0 10*3/uL (ref 0.0–0.1)
Basophils Relative: 1 % (ref 0–1)
Eosinophils Absolute: 0 10*3/uL (ref 0.0–0.7)
Eosinophils Relative: 2 % (ref 0–5)
Lymphocytes Relative: 20 % (ref 12–46)
Lymphocytes Relative: 22 % (ref 12–46)
Lymphs Abs: 0.7 10*3/uL (ref 0.7–4.0)
Monocytes Absolute: 0.2 10*3/uL (ref 0.1–1.0)
Monocytes Absolute: 0.2 10*3/uL (ref 0.1–1.0)
Monocytes Relative: 5 % (ref 3–12)
Neutro Abs: 2.4 10*3/uL (ref 1.7–7.7)
Neutrophils Relative %: 71 % (ref 43–77)
Neutrophils Relative %: 68 % (ref 43–77)

## 2010-12-24 LAB — CBC
HCT: 31 % — ABNORMAL LOW (ref 36.0–46.0)
HCT: 31.8 % — ABNORMAL LOW (ref 36.0–46.0)
Hemoglobin: 10.5 g/dL — ABNORMAL LOW (ref 12.0–15.0)
Hemoglobin: 10.6 g/dL — ABNORMAL LOW (ref 12.0–15.0)
Hemoglobin: 10.8 g/dL — ABNORMAL LOW (ref 12.0–15.0)
Hemoglobin: 11 g/dL — ABNORMAL LOW (ref 12.0–15.0)
Hemoglobin: 11.4 g/dL — ABNORMAL LOW (ref 12.0–15.0)
MCHC: 34.4 g/dL (ref 30.0–36.0)
MCHC: 33.8 g/dL (ref 30.0–36.0)
MCHC: 34.6 g/dL (ref 30.0–36.0)
MCV: 85.1 fL (ref 78.0–100.0)
Platelets: 313 10*3/uL (ref 150–400)
Platelets: 313 10*3/uL (ref 150–400)
Platelets: 319 10*3/uL (ref 150–400)
Platelets: 362 10*3/uL (ref 150–400)
RBC: 3.73 MIL/uL — ABNORMAL LOW (ref 3.87–5.11)
RBC: 3.5 MIL/uL — ABNORMAL LOW (ref 3.87–5.11)
RBC: 3.56 MIL/uL — ABNORMAL LOW (ref 3.87–5.11)
RBC: 3.72 MIL/uL — ABNORMAL LOW (ref 3.87–5.11)
RDW: 14.9 % (ref 11.5–15.5)
RDW: 14.9 % (ref 11.5–15.5)
RDW: 14.9 % (ref 11.5–15.5)
RDW: 15.1 % (ref 11.5–15.5)
RDW: 15.3 % (ref 11.5–15.5)
WBC: 3.6 10*3/uL — ABNORMAL LOW (ref 4.0–10.5)

## 2010-12-24 LAB — BASIC METABOLIC PANEL
BUN: 7 mg/dL (ref 6–23)
CO2: 29 mEq/L (ref 19–32)
CO2: 30 mEq/L (ref 19–32)
Calcium: 8.3 mg/dL — ABNORMAL LOW (ref 8.4–10.5)
Calcium: 8.7 mg/dL (ref 8.4–10.5)
Chloride: 105 mEq/L (ref 96–112)
Creatinine, Ser: 0.67 mg/dL (ref 0.4–1.2)
Creatinine, Ser: 0.78 mg/dL (ref 0.4–1.2)
GFR calc Af Amer: >60 mL/min (ref >60)
GFR calc Af Amer: >60 mL/min (ref >60)
GFR calc non Af Amer: >60 mL/min (ref >60)
GFR calc non Af Amer: >60 mL/min (ref >60)
Glucose, Bld: 108 mg/dL — ABNORMAL HIGH (ref 70–99)
Glucose, Bld: 85 mg/dL (ref 70–99)
Potassium: 3.7 mEq/L (ref 3.5–5.1)
Sodium: 139 mEq/L (ref 135–145)
Sodium: 141 mEq/L (ref 135–145)
Sodium: 141 mEq/L (ref 135–145)

## 2010-12-24 LAB — URINALYSIS, ROUTINE W REFLEX MICROSCOPIC
Bilirubin Urine: NEGATIVE
Nitrite: NEGATIVE
Specific Gravity, Urine: 1.014 (ref 1.005–1.030)
pH: 7 (ref 5.0–8.0)

## 2010-12-24 LAB — COMPREHENSIVE METABOLIC PANEL
ALT: 10 U/L (ref 0–35)
ALT: 10 U/L (ref 0–35)
AST: 17 U/L (ref 0–37)
Alkaline Phosphatase: 44 U/L (ref 39–117)
CO2: 29 mEq/L (ref 19–32)
CO2: 25 mEq/L (ref 19–32)
Calcium: 8.5 mg/dL (ref 8.4–10.5)
Chloride: 107 mEq/L (ref 96–112)
Chloride: 107 mEq/L (ref 96–112)
Creatinine, Ser: 0.77 mg/dL (ref 0.4–1.2)
GFR calc Af Amer: >60 mL/min (ref >60)
GFR calc non Af Amer: >60 mL/min (ref >60)
GFR calc non Af Amer: >60 mL/min (ref >60)
Glucose, Bld: 92 mg/dL (ref 70–99)
Glucose, Bld: 105 mg/dL — ABNORMAL HIGH (ref 70–99)
Potassium: 3.4 mEq/L — ABNORMAL LOW (ref 3.5–5.1)
Sodium: 140 mEq/L (ref 135–145)
Total Bilirubin: 0.4 mg/dL (ref 0.3–1.2)
Total Bilirubin: 0.4 mg/dL (ref 0.3–1.2)

## 2010-12-24 LAB — RAPID URINE DRUG SCREEN, HOSP PERFORMED
Amphetamines: NOT DETECTED
Barbiturates: NOT DETECTED
Tetrahydrocannabinol: NOT DETECTED

## 2010-12-24 LAB — POCT I-STAT, CHEM 8
BUN: 11 mg/dL (ref 6–23)
Calcium, Ion: 1.1 mmol/L — ABNORMAL LOW (ref 1.12–1.32)
Chloride: 107 mEq/L (ref 96–112)
Creatinine, Ser: 0.9 mg/dL (ref 0.4–1.2)
Glucose, Bld: 82 mg/dL (ref 70–99)
TCO2: 26 mmol/L (ref 0–100)

## 2010-12-24 LAB — CULTURE, ROUTINE-ABSCESS: Gram Stain: NONE SEEN

## 2010-12-24 LAB — URINE MICROSCOPIC-ADD ON

## 2010-12-24 LAB — HIGH SENSITIVITY CRP: CRP, High Sensitivity: 23.8 mg/L — ABNORMAL HIGH

## 2010-12-24 LAB — HIV ANTIBODY (ROUTINE TESTING W REFLEX): HIV: NONREACTIVE

## 2010-12-25 ENCOUNTER — Ambulatory Visit (INDEPENDENT_AMBULATORY_CARE_PROVIDER_SITE_OTHER): Payer: Medicaid Other | Admitting: Internal Medicine

## 2010-12-25 ENCOUNTER — Other Ambulatory Visit: Payer: Self-pay | Admitting: Internal Medicine

## 2010-12-25 ENCOUNTER — Encounter: Payer: Self-pay | Admitting: Internal Medicine

## 2010-12-25 VITALS — BP 119/77 | HR 79 | Temp 98.2°F | Ht 61.5 in | Wt 103.7 lb

## 2010-12-25 DIAGNOSIS — W540XXA Bitten by dog, initial encounter: Secondary | ICD-10-CM | POA: Insufficient documentation

## 2010-12-25 DIAGNOSIS — I776 Arteritis, unspecified: Secondary | ICD-10-CM

## 2010-12-25 DIAGNOSIS — F1910 Other psychoactive substance abuse, uncomplicated: Secondary | ICD-10-CM

## 2010-12-25 DIAGNOSIS — T148XXA Other injury of unspecified body region, initial encounter: Secondary | ICD-10-CM

## 2010-12-25 DIAGNOSIS — F329 Major depressive disorder, single episode, unspecified: Secondary | ICD-10-CM

## 2010-12-25 DIAGNOSIS — M129 Arthropathy, unspecified: Secondary | ICD-10-CM

## 2010-12-25 DIAGNOSIS — I10 Essential (primary) hypertension: Secondary | ICD-10-CM

## 2010-12-25 MED ORDER — DIPHENHYDRAMINE HCL 25 MG PO CAPS: 25.0000 mg | ORAL_CAPSULE | Freq: Four times a day (QID) | ORAL | Status: DC | PRN

## 2010-12-25 MED ORDER — FLUOXETINE HCL 40 MG PO CAPS: 40.0000 mg | ORAL_CAPSULE | Freq: Every day | ORAL | Status: DC

## 2010-12-25 NOTE — Assessment & Plan Note (Signed)
There is some suspicion that the patient may have underlying rheumatologic disease outside of her vasculitis that is induced by cocaine. She is to followup at [facility] in April regarding this. The importance of her remaining clean was stressed and the patient voiced understanding.

## 2010-12-25 NOTE — Assessment & Plan Note (Signed)
This appears to be healing well. The patient is to complete her antibiotics as instructed .

## 2010-12-25 NOTE — Progress Notes (Signed)
Subjective:    [name], female    DOB: 07-13-1961, 50 y.o.   MRN: [id number]  HPI  patient was hospitalized the end of January for a vasculitis flare. She states that she was going about abstaining from cocaine until approximately a month ago. She was then seen in the emergency room in March 17th for another vasculitis flare and also treatment of a dog bite she sustained one week prior to emergency department visit.  The dog bite was to her left upper thigh. At that visit the patient was evaluated by one of the residents physicians and was discharged home with a prednisone taper, tramadol, and a 14 day course of doxycycline and clindamycin. Patient states the she's been compliant with this regimen and has noted improvement of her vasculitis flare which has occurred predominantly around her ears and good healing of the wounds of the dog bite.  Patient states she has no other further complaints. She is to followup at the [facility] rheumatology clinic in April.  She states that she has had some joint pains.   These are primarily in her PIP joints of both hands and shoulders.  She understands the need to stay away from cocaine. She states that she does not go to group therapy for her cocaine abuse because it makes her want to use more. She instead is trying to quit using cocaine on her own. She states her last use was approximately one month ago.   the patient is still using tobacco. She informs me that she was able to acquire disability something she's been working towards for a long time.   She states that she feels like her depression medicine could be increased. She says it is helping some but when she thinks about her family and her medical problems she sometimes goes into a depression. She said she has been eating and sleeping okay   Review of Systems  Constitutional: Negative for fever, chills, diaphoresis, activity change, appetite change, fatigue and unexpected weight change.  HENT:  Negative for nosebleeds, rhinorrhea, neck pain and neck stiffness.   Eyes: Negative for discharge and redness.  Respiratory: Negative for cough and shortness of breath.   Cardiovascular: Negative for chest pain, palpitations and leg swelling.  Gastrointestinal: Negative for abdominal distention.  Genitourinary: Negative for dysuria.  Musculoskeletal: Positive for joint swelling and arthralgias.  Neurological: Negative for dizziness, numbness and headaches.  Psychiatric/Behavioral: Positive for dysphoric mood. Negative for behavioral problems.       Objective:   Physical Exam  Constitutional: She is oriented to person, place, and time.       Thin appearing, appropriately dressed  HENT:  Mouth/Throat: Oropharynx is clear and moist.        Both ears have crusted nonbleeding scabs that do not appear infected.  Eyes: Conjunctivae and EOM are normal. Pupils are equal, round, and reactive to light.  Neck: Normal range of motion. Neck supple. No thyromegaly present.  Cardiovascular: Normal rate, regular rhythm and normal heart sounds.   Pulmonary/Chest: Effort normal and breath sounds normal. No respiratory distress.  Abdominal: Soft. Bowel sounds are normal. She exhibits no distension. There is no tenderness. There is no rebound and no guarding.  Musculoskeletal: Normal range of motion. She exhibits no edema.        Some tenderness to the PIP joints of both hands  Lymphadenopathy:    She has no cervical adenopathy.  Neurological: She is alert and oriented to person, place, and time. No  cranial nerve deficit.  Skin: Skin is warm and dry.        The dog bites the patient out her upper left thigh appeared to be well healing without infection.   The patient's prior scarring from her vasculitis  flares is obvious.  Psychiatric: She has a normal mood and affect. Her behavior is normal. Judgment and thought content normal.          Assessment & Plan:

## 2010-12-25 NOTE — Assessment & Plan Note (Addendum)
The patient's blood pressures at goal today. We will check a basic metabolic panel to follow up her potassium and creatinine since her lisinopril as been increased from 10 mg to 20 mg.

## 2010-12-25 NOTE — Assessment & Plan Note (Signed)
The patient currently abuses tobacco cocaine and marijuana. Her principal problem is her cocaine abuse as it directly relates to her vasculitis. I counseled her again today about this. Her last use was approximately one month ago and she understands the consequences of her use. At this time she is not attending any sort of substance abuse counseling and she states it makes her want to use more.

## 2010-12-25 NOTE — Assessment & Plan Note (Addendum)
The patient recently had a vasculitis flare precipitated by cocaine use. She is currently undergoing a prednisone taper and will finish this as previously instructed.

## 2010-12-25 NOTE — Assessment & Plan Note (Addendum)
This appears to be some optimally controlled.  The patient denies any suicidal or homicidal ideation.  We will increase her Prozac to 40 mg daily.

## 2010-12-25 NOTE — Patient Instructions (Signed)
I have increased your dose of prozac to 68m daily.  I have also refilled your benadryl. Please return to see me in June. You will have labwork done today.  If there is any change needed in your medicines or any abnormal results, I will call you. Continue to try to stay away from cocaine in any way that you can.  [facility].

## 2010-12-26 LAB — BASIC METABOLIC PANEL
BUN: 16 mg/dL (ref 6–23)
CO2: 26 mEq/L (ref 19–32)
Chloride: 104 mEq/L (ref 96–112)
Creat: 0.8 mg/dL (ref 0.40–1.20)
Glucose, Bld: 107 mg/dL — ABNORMAL HIGH (ref 70–99)
Potassium: 3.2 mEq/L — ABNORMAL LOW (ref 3.5–5.3)

## 2010-12-29 LAB — DIFFERENTIAL
Lymphs Abs: 0.7 10*3/uL (ref 0.7–4.0)
Monocytes Absolute: 0.2 10*3/uL (ref 0.1–1.0)
Monocytes Relative: 3 % (ref 3–12)
Neutro Abs: 4.1 10*3/uL (ref 1.7–7.7)
Neutrophils Relative %: 82 % — ABNORMAL HIGH (ref 43–77)

## 2010-12-29 LAB — CBC
Hemoglobin: 10.9 g/dL — ABNORMAL LOW (ref 12.0–15.0)
MCHC: 32.8 g/dL (ref 30.0–36.0)
MCV: 83.6 fL (ref 78.0–100.0)
RBC: 3.97 MIL/uL (ref 3.87–5.11)
WBC: 5 10*3/uL (ref 4.0–10.5)

## 2011-01-09 LAB — CBC
HCT: 24.8 % — ABNORMAL LOW (ref 36.0–46.0)
HCT: 25.3 % — ABNORMAL LOW (ref 36.0–46.0)
HCT: 26 % — ABNORMAL LOW (ref 36.0–46.0)
HCT: 26 % — ABNORMAL LOW (ref 36.0–46.0)
HCT: 26.1 % — ABNORMAL LOW (ref 36.0–46.0)
Hemoglobin: 8.3 g/dL — ABNORMAL LOW (ref 12.0–15.0)
Hemoglobin: 8.7 g/dL — ABNORMAL LOW (ref 12.0–15.0)
Hemoglobin: 8.8 g/dL — ABNORMAL LOW (ref 12.0–15.0)
Hemoglobin: 8.9 g/dL — ABNORMAL LOW (ref 12.0–15.0)
MCHC: 32.2 g/dL (ref 30.0–36.0)
MCHC: 34 g/dL (ref 30.0–36.0)
MCV: 78.2 fL (ref 78.0–100.0)
MCV: 78.9 fL (ref 78.0–100.0)
MCV: 79.2 fL (ref 78.0–100.0)
MCV: 80.7 fL (ref 78.0–100.0)
MCV: 81.2 fL (ref 78.0–100.0)
Platelets: 516 10*3/uL — ABNORMAL HIGH (ref 150–400)
Platelets: 566 10*3/uL — ABNORMAL HIGH (ref 150–400)
Platelets: 568 10*3/uL — ABNORMAL HIGH (ref 150–400)
Platelets: 599 10*3/uL — ABNORMAL HIGH (ref 150–400)
Platelets: 620 10*3/uL — ABNORMAL HIGH (ref 150–400)
Platelets: 622 10*3/uL — ABNORMAL HIGH (ref 150–400)
RDW: 17 % — ABNORMAL HIGH (ref 11.5–15.5)
RDW: 17.5 % — ABNORMAL HIGH (ref 11.5–15.5)
RDW: 17.7 % — ABNORMAL HIGH (ref 11.5–15.5)
RDW: 18.2 % — ABNORMAL HIGH (ref 11.5–15.5)
WBC: 10.4 10*3/uL (ref 4.0–10.5)
WBC: 10.8 10*3/uL — ABNORMAL HIGH (ref 4.0–10.5)
WBC: 15.9 10*3/uL — ABNORMAL HIGH (ref 4.0–10.5)
WBC: 8.2 10*3/uL (ref 4.0–10.5)
WBC: 9.6 10*3/uL (ref 4.0–10.5)

## 2011-01-09 LAB — TISSUE CULTURE: Culture: NO GROWTH

## 2011-01-09 LAB — FUNGUS CULTURE W SMEAR

## 2011-01-09 LAB — BASIC METABOLIC PANEL
BUN: 10 mg/dL (ref 6–23)
BUN: 14 mg/dL (ref 6–23)
BUN: 14 mg/dL (ref 6–23)
BUN: 15 mg/dL (ref 6–23)
BUN: 16 mg/dL (ref 6–23)
CO2: 32 mEq/L (ref 19–32)
Calcium: 8.8 mg/dL (ref 8.4–10.5)
Calcium: 8.8 mg/dL (ref 8.4–10.5)
Chloride: 101 mEq/L (ref 96–112)
Chloride: 99 mEq/L (ref 96–112)
Creatinine, Ser: 0.46 mg/dL (ref 0.4–1.2)
Creatinine, Ser: 0.53 mg/dL (ref 0.4–1.2)
GFR calc non Af Amer: >60 mL/min (ref >60)
GFR calc non Af Amer: >60 mL/min (ref >60)
Glucose, Bld: 133 mg/dL — ABNORMAL HIGH (ref 70–99)
Glucose, Bld: 239 mg/dL — ABNORMAL HIGH (ref 70–99)
Glucose, Bld: 83 mg/dL (ref 70–99)
Potassium: 3.4 mEq/L — ABNORMAL LOW (ref 3.5–5.1)
Potassium: 3.7 mEq/L (ref 3.5–5.1)
Potassium: 4.3 mEq/L (ref 3.5–5.1)
Sodium: 138 mEq/L (ref 135–145)
Sodium: 138 mEq/L (ref 135–145)

## 2011-01-09 LAB — AFB CULTURE WITH SMEAR (NOT AT ARMC): Acid Fast Smear: NONE SEEN

## 2011-01-09 LAB — SEDIMENTATION RATE: Sed Rate: 50 mm/hr — ABNORMAL HIGH (ref 0–22)

## 2011-01-09 LAB — ANTI-SMITH ANTIBODY: ENA SM Ab Ser-aCnc: <0.2 AI (ref <1.0)

## 2011-01-09 LAB — HIV-1 RNA ULTRAQUANT REFLEX TO GENTYP+: HIV-1 RNA Quant, Log: <1.68 {Log} (ref <1.68)

## 2011-01-10 LAB — BASIC METABOLIC PANEL
BUN: 10 mg/dL (ref 6–23)
BUN: 7 mg/dL (ref 6–23)
BUN: 5 mg/dL — ABNORMAL LOW (ref 6–23)
BUN: 6 mg/dL (ref 6–23)
BUN: 7 mg/dL (ref 6–23)
Calcium: 8.3 mg/dL — ABNORMAL LOW (ref 8.4–10.5)
Calcium: 8.6 mg/dL (ref 8.4–10.5)
Calcium: 8.2 mg/dL — ABNORMAL LOW (ref 8.4–10.5)
Calcium: 9.1 mg/dL (ref 8.4–10.5)
Chloride: 102 mEq/L (ref 96–112)
Chloride: 104 mEq/L (ref 96–112)
Chloride: 106 mEq/L (ref 96–112)
Chloride: 104 mEq/L (ref 96–112)
Creatinine, Ser: 0.45 mg/dL (ref 0.4–1.2)
Creatinine, Ser: 0.46 mg/dL (ref 0.4–1.2)
Creatinine, Ser: 0.48 mg/dL (ref 0.4–1.2)
Creatinine, Ser: 0.53 mg/dL (ref 0.4–1.2)
Creatinine, Ser: 0.57 mg/dL (ref 0.4–1.2)
GFR calc Af Amer: >60 mL/min (ref >60)
GFR calc Af Amer: >60 mL/min (ref >60)
GFR calc Af Amer: >60 mL/min (ref >60)
GFR calc non Af Amer: >60 mL/min (ref >60)
GFR calc non Af Amer: >60 mL/min (ref >60)
GFR calc non Af Amer: >60 mL/min (ref >60)
GFR calc non Af Amer: >60 mL/min (ref >60)
GFR calc non Af Amer: >60 mL/min (ref >60)
GFR calc non Af Amer: >60 mL/min (ref >60)
GFR calc non Af Amer: >60 mL/min (ref >60)
GFR calc non Af Amer: >60 mL/min (ref >60)
Glucose, Bld: 114 mg/dL — ABNORMAL HIGH (ref 70–99)
Glucose, Bld: 87 mg/dL (ref 70–99)
Glucose, Bld: 142 mg/dL — ABNORMAL HIGH (ref 70–99)
Glucose, Bld: 145 mg/dL — ABNORMAL HIGH (ref 70–99)
Glucose, Bld: 81 mg/dL (ref 70–99)
Glucose, Bld: 88 mg/dL (ref 70–99)
Potassium: 3 mEq/L — ABNORMAL LOW (ref 3.5–5.1)
Potassium: 3.3 mEq/L — ABNORMAL LOW (ref 3.5–5.1)
Potassium: 3.4 mEq/L — ABNORMAL LOW (ref 3.5–5.1)
Potassium: 3.7 mEq/L (ref 3.5–5.1)
Sodium: 137 mEq/L (ref 135–145)
Sodium: 137 mEq/L (ref 135–145)
Sodium: 135 mEq/L (ref 135–145)

## 2011-01-10 LAB — CBC
HCT: 23.8 % — ABNORMAL LOW (ref 36.0–46.0)
HCT: 25.1 % — ABNORMAL LOW (ref 36.0–46.0)
HCT: 26.1 % — ABNORMAL LOW (ref 36.0–46.0)
HCT: 29.5 % — ABNORMAL LOW (ref 36.0–46.0)
HCT: 24.8 % — ABNORMAL LOW (ref 36.0–46.0)
HCT: 25.4 % — ABNORMAL LOW (ref 36.0–46.0)
HCT: 25.6 % — ABNORMAL LOW (ref 36.0–46.0)
HCT: 27.8 % — ABNORMAL LOW (ref 36.0–46.0)
Hemoglobin: 8.2 g/dL — ABNORMAL LOW (ref 12.0–15.0)
Hemoglobin: 8.4 g/dL — ABNORMAL LOW (ref 12.0–15.0)
Hemoglobin: 8.5 g/dL — ABNORMAL LOW (ref 12.0–15.0)
Hemoglobin: 9.6 g/dL — ABNORMAL LOW (ref 12.0–15.0)
Hemoglobin: 8.8 g/dL — ABNORMAL LOW (ref 12.0–15.0)
MCHC: 33 g/dL (ref 30.0–36.0)
MCHC: 32.7 g/dL (ref 30.0–36.0)
MCHC: 33.9 g/dL (ref 30.0–36.0)
MCV: 76.6 fL — ABNORMAL LOW (ref 78.0–100.0)
MCV: 77.6 fL — ABNORMAL LOW (ref 78.0–100.0)
MCV: 75.4 fL — ABNORMAL LOW (ref 78.0–100.0)
MCV: 78.8 fL (ref 78.0–100.0)
MCV: 80.8 fL (ref 78.0–100.0)
Platelets: 414 10*3/uL — ABNORMAL HIGH (ref 150–400)
Platelets: 426 10*3/uL — ABNORMAL HIGH (ref 150–400)
Platelets: 511 10*3/uL — ABNORMAL HIGH (ref 150–400)
Platelets: 572 10*3/uL — ABNORMAL HIGH (ref 150–400)
Platelets: 406 10*3/uL — ABNORMAL HIGH (ref 150–400)
Platelets: 442 10*3/uL — ABNORMAL HIGH (ref 150–400)
Platelets: 472 10*3/uL — ABNORMAL HIGH (ref 150–400)
Platelets: 535 10*3/uL — ABNORMAL HIGH (ref 150–400)
RBC: 3.82 MIL/uL — ABNORMAL LOW (ref 3.87–5.11)
RBC: 3.68 MIL/uL — ABNORMAL LOW (ref 3.87–5.11)
RDW: 17.8 % — ABNORMAL HIGH (ref 11.5–15.5)
RDW: 17.9 % — ABNORMAL HIGH (ref 11.5–15.5)
RDW: 18 % — ABNORMAL HIGH (ref 11.5–15.5)
RDW: 18 % — ABNORMAL HIGH (ref 11.5–15.5)
RDW: 18.3 % — ABNORMAL HIGH (ref 11.5–15.5)
RDW: 16.2 % — ABNORMAL HIGH (ref 11.5–15.5)
RDW: 16.4 % — ABNORMAL HIGH (ref 11.5–15.5)
RDW: 16.5 % — ABNORMAL HIGH (ref 11.5–15.5)
RDW: 16.6 % — ABNORMAL HIGH (ref 11.5–15.5)
RDW: 16.6 % — ABNORMAL HIGH (ref 11.5–15.5)
WBC: 12.4 10*3/uL — ABNORMAL HIGH (ref 4.0–10.5)
WBC: 3.8 10*3/uL — ABNORMAL LOW (ref 4.0–10.5)
WBC: 4.7 10*3/uL (ref 4.0–10.5)
WBC: 7.7 10*3/uL (ref 4.0–10.5)
WBC: 8.3 10*3/uL (ref 4.0–10.5)

## 2011-01-10 LAB — URINE CULTURE
Colony Count: 30000
Colony Count: 65000

## 2011-01-10 LAB — COMPREHENSIVE METABOLIC PANEL
ALT: 10 U/L (ref 0–35)
AST: 16 U/L (ref 0–37)
AST: 28 U/L (ref 0–37)
Albumin: 2 g/dL — ABNORMAL LOW (ref 3.5–5.2)
Albumin: 2.5 g/dL — ABNORMAL LOW (ref 3.5–5.2)
Albumin: 2.5 g/dL — ABNORMAL LOW (ref 3.5–5.2)
Alkaline Phosphatase: 56 U/L (ref 39–117)
BUN: 7 mg/dL (ref 6–23)
BUN: 13 mg/dL (ref 6–23)
BUN: 9 mg/dL (ref 6–23)
CO2: 26 mEq/L (ref 19–32)
CO2: 29 mEq/L (ref 19–32)
Calcium: 8.4 mg/dL (ref 8.4–10.5)
Calcium: 9.5 mg/dL (ref 8.4–10.5)
Chloride: 106 mEq/L (ref 96–112)
Creatinine, Ser: 0.56 mg/dL (ref 0.4–1.2)
Creatinine, Ser: 0.59 mg/dL (ref 0.4–1.2)
Creatinine, Ser: 0.63 mg/dL (ref 0.4–1.2)
GFR calc Af Amer: >60 mL/min (ref >60)
GFR calc Af Amer: >60 mL/min (ref >60)
GFR calc non Af Amer: >60 mL/min (ref >60)
GFR calc non Af Amer: >60 mL/min (ref >60)
Glucose, Bld: 91 mg/dL (ref 70–99)
Potassium: 3.5 mEq/L (ref 3.5–5.1)
Sodium: 136 mEq/L (ref 135–145)
Total Bilirubin: 0.5 mg/dL (ref 0.3–1.2)
Total Bilirubin: 0.4 mg/dL (ref 0.3–1.2)
Total Protein: 5.6 g/dL — ABNORMAL LOW (ref 6.0–8.3)
Total Protein: 7 g/dL (ref 6.0–8.3)

## 2011-01-10 LAB — C4 COMPLEMENT: Complement C4, Body Fluid: 4 mg/dL — ABNORMAL LOW (ref 16–47)

## 2011-01-10 LAB — URINALYSIS, ROUTINE W REFLEX MICROSCOPIC
Bilirubin Urine: NEGATIVE
Glucose, UA: NEGATIVE mg/dL
Glucose, UA: NEGATIVE mg/dL
Hgb urine dipstick: NEGATIVE
Hgb urine dipstick: NEGATIVE
Hgb urine dipstick: NEGATIVE
Ketones, ur: NEGATIVE mg/dL
Ketones, ur: NEGATIVE mg/dL
Leukocytes, UA: NEGATIVE
Nitrite: NEGATIVE
Nitrite: NEGATIVE
Protein, ur: 30 mg/dL — AB
Specific Gravity, Urine: 1.015 (ref 1.005–1.030)
Specific Gravity, Urine: 1.021 (ref 1.005–1.030)
Urobilinogen, UA: 2 mg/dL — ABNORMAL HIGH (ref 0.0–1.0)
pH: 6 (ref 5.0–8.0)
pH: 6.5 (ref 5.0–8.0)

## 2011-01-10 LAB — DIFFERENTIAL
Basophils Absolute: 0 10*3/uL (ref 0.0–0.1)
Basophils Absolute: 0 10*3/uL (ref 0.0–0.1)
Basophils Absolute: 0 10*3/uL (ref 0.0–0.1)
Basophils Absolute: 0 10*3/uL (ref 0.0–0.1)
Eosinophils Relative: 0 % (ref 0–5)
Eosinophils Relative: 2 % (ref 0–5)
Eosinophils Relative: 0 % (ref 0–5)
Eosinophils Relative: 2 % (ref 0–5)
Lymphocytes Relative: 18 % (ref 12–46)
Lymphocytes Relative: 14 % (ref 12–46)
Lymphocytes Relative: 21 % (ref 12–46)
Lymphocytes Relative: 27 % (ref 12–46)
Lymphs Abs: 0.6 10*3/uL — ABNORMAL LOW (ref 0.7–4.0)
Lymphs Abs: 1 10*3/uL (ref 0.7–4.0)
Lymphs Abs: 0.7 10*3/uL (ref 0.7–4.0)
Monocytes Absolute: 0.1 10*3/uL (ref 0.1–1.0)
Monocytes Absolute: 0 10*3/uL — ABNORMAL LOW (ref 0.1–1.0)
Monocytes Relative: 2 % — ABNORMAL LOW (ref 3–12)
Monocytes Relative: 2 % — ABNORMAL LOW (ref 3–12)
Monocytes Relative: 1 % — ABNORMAL LOW (ref 3–12)
Neutro Abs: 3 10*3/uL (ref 1.7–7.7)
Neutro Abs: 4.3 10*3/uL (ref 1.7–7.7)
Neutro Abs: 1.6 10*3/uL — ABNORMAL LOW (ref 1.7–7.7)
Neutro Abs: 2.5 10*3/uL (ref 1.7–7.7)
Neutro Abs: 4 10*3/uL (ref 1.7–7.7)
Neutrophils Relative %: 67 % (ref 43–77)
Neutrophils Relative %: 75 % (ref 43–77)

## 2011-01-10 LAB — HEPATIC FUNCTION PANEL
ALT: 10 U/L (ref 0–35)
AST: 23 U/L (ref 0–37)
Albumin: 2.4 g/dL — ABNORMAL LOW (ref 3.5–5.2)
Alkaline Phosphatase: 74 U/L (ref 39–117)
Bilirubin, Direct: <0.1 mg/dL (ref 0.0–0.3)
Total Bilirubin: 0.4 mg/dL (ref 0.3–1.2)

## 2011-01-10 LAB — PREGNANCY, URINE: Preg Test, Ur: NEGATIVE

## 2011-01-10 LAB — SJOGRENS SYNDROME-A EXTRACTABLE NUCLEAR ANTIBODY: SSA (Ro) (ENA) Antibody, IgG: <0.2 AI (ref <1.0)

## 2011-01-10 LAB — TSH: TSH: 1.062 u[IU]/mL (ref 0.350–4.500)

## 2011-01-10 LAB — APTT: aPTT: 30 seconds (ref 24–37)

## 2011-01-10 LAB — CULTURE, BLOOD (ROUTINE X 2)

## 2011-01-10 LAB — URINE MICROSCOPIC-ADD ON

## 2011-01-10 LAB — GC/CHLAMYDIA PROBE AMP, URINE
Chlamydia, Swab/Urine, PCR: NEGATIVE
Chlamydia, Swab/Urine, PCR: NEGATIVE
GC Probe Amp, Urine: NEGATIVE

## 2011-01-10 LAB — VITAMIN B12: Vitamin B-12: 210 pg/mL — ABNORMAL LOW (ref 211–911)

## 2011-01-10 LAB — POCT I-STAT, CHEM 8
Calcium, Ion: 1.1 mmol/L — ABNORMAL LOW (ref 1.12–1.32)
Chloride: 102 mEq/L (ref 96–112)
Glucose, Bld: 83 mg/dL (ref 70–99)
HCT: 28 % — ABNORMAL LOW (ref 36.0–46.0)
Hemoglobin: 9.5 g/dL — ABNORMAL LOW (ref 12.0–15.0)
Potassium: 3.9 mEq/L (ref 3.5–5.1)

## 2011-01-10 LAB — CARDIOLIPIN ANTIBODIES, IGG, IGM, IGA
Anticardiolipin IgA: <10 APL U/mL — ABNORMAL LOW (ref <10)
Anticardiolipin IgG: <10 GPL U/mL — ABNORMAL LOW (ref <10)

## 2011-01-10 LAB — ANA: Anti Nuclear Antibody(ANA): POSITIVE — AB

## 2011-01-10 LAB — IRON AND TIBC
Iron: <10 ug/dL — ABNORMAL LOW (ref 42–135)
UIBC: 149 ug/dL
UIBC: 137 ug/dL

## 2011-01-10 LAB — RETICULOCYTES
RBC.: 3.22 MIL/uL — ABNORMAL LOW (ref 3.87–5.11)
Retic Count, Absolute: 25.8 10*3/uL (ref 19.0–186.0)
Retic Ct Pct: 0.8 % (ref 0.4–3.1)

## 2011-01-10 LAB — PHOSPHORUS: Phosphorus: 2.9 mg/dL (ref 2.3–4.6)

## 2011-01-10 LAB — FERRITIN: Ferritin: 85 ng/mL (ref 10–291)

## 2011-01-10 LAB — BETA-2-GLYCOPROTEIN I ABS, IGG/M/A
Beta-2 Glyco I IgG: <9 SGU (ref <=20)
Beta-2-Glycoprotein I IgA: 12 SAU (ref <=20)

## 2011-01-10 LAB — AFB CULTURE WITH SMEAR (NOT AT ARMC): Acid Fast Smear: NONE SEEN

## 2011-01-10 LAB — HEPATITIS PANEL, ACUTE
HCV Ab: NEGATIVE
Hep B C IgM: NEGATIVE
Hepatitis B Surface Ag: NEGATIVE

## 2011-01-10 LAB — MAGNESIUM
Magnesium: 1.9 mg/dL (ref 1.5–2.5)
Magnesium: 2.1 mg/dL (ref 1.5–2.5)

## 2011-01-10 LAB — URIC ACID: Uric Acid, Serum: 2.9 mg/dL (ref 2.4–7.0)

## 2011-01-10 LAB — RAPID URINE DRUG SCREEN, HOSP PERFORMED
Cocaine: POSITIVE — AB
Opiates: NOT DETECTED
Tetrahydrocannabinol: POSITIVE — AB

## 2011-01-10 LAB — C-REACTIVE PROTEIN: CRP: 6.9 mg/dL — ABNORMAL HIGH (ref <0.6)

## 2011-01-10 LAB — HEMOCCULT GUIAC POC 1CARD (OFFICE): Fecal Occult Bld: NEGATIVE

## 2011-01-10 LAB — HEMOGLOBIN A1C: Mean Plasma Glucose: 117 mg/dL

## 2011-01-10 LAB — LUPUS ANTICOAGULANT PANEL
Lupus Anticoagulant: NOT DETECTED
dRVVT Incubated 1:1 Mix: 45.2 secs (ref 36.1–47.0)

## 2011-01-10 LAB — ANTI-NUCLEAR AB-TITER (ANA TITER): ANA Titer 1: 1:40 {titer} — ABNORMAL HIGH

## 2011-01-10 LAB — COMPLEMENT, TOTAL: Compl, Total (CH50): 34 U/mL (ref 31–60)

## 2011-01-10 LAB — ANGIOTENSIN CONVERTING ENZYME: Angiotensin-Converting Enzyme: 30 U/L (ref 9–67)

## 2011-01-10 LAB — ANTI-SMOOTH MUSCLE ANTIBODY, IGG: F-Actin IgG: <20 U (ref <20)

## 2011-01-10 LAB — CYCLIC CITRUL PEPTIDE ANTIBODY, IGG: Cyclic Citrullin Peptide Ab: 1.7 U/mL (ref <7)

## 2011-01-11 LAB — URINALYSIS, ROUTINE W REFLEX MICROSCOPIC
Ketones, ur: NEGATIVE mg/dL
Protein, ur: NEGATIVE mg/dL
Urobilinogen, UA: 2 mg/dL — ABNORMAL HIGH (ref 0.0–1.0)

## 2011-01-11 LAB — DIFFERENTIAL
Basophils Absolute: 0 10*3/uL (ref 0.0–0.1)
Basophils Relative: 0 % (ref 0–1)
Eosinophils Absolute: 0 10*3/uL (ref 0.0–0.7)
Monocytes Relative: 3 % (ref 3–12)
Neutro Abs: 2.5 10*3/uL (ref 1.7–7.7)
Neutrophils Relative %: 53 % (ref 43–77)

## 2011-01-11 LAB — COMPREHENSIVE METABOLIC PANEL
Alkaline Phosphatase: 47 U/L (ref 39–117)
BUN: 11 mg/dL (ref 6–23)
CO2: 27 mEq/L (ref 19–32)
GFR calc non Af Amer: >60 mL/min (ref >60)
Glucose, Bld: 94 mg/dL (ref 70–99)
Potassium: 3.5 mEq/L (ref 3.5–5.1)
Total Bilirubin: 0.5 mg/dL (ref 0.3–1.2)
Total Protein: 6.9 g/dL (ref 6.0–8.3)

## 2011-01-11 LAB — URINE MICROSCOPIC-ADD ON

## 2011-01-11 LAB — LIPASE, BLOOD: Lipase: 16 U/L (ref 11–59)

## 2011-01-11 LAB — CBC
HCT: 25.8 % — ABNORMAL LOW (ref 36.0–46.0)
Hemoglobin: 9.2 g/dL — ABNORMAL LOW (ref 12.0–15.0)
RBC: 3.18 MIL/uL — ABNORMAL LOW (ref 3.87–5.11)
RDW: 16.7 % — ABNORMAL HIGH (ref 11.5–15.5)

## 2011-01-11 LAB — POCT PREGNANCY, URINE: Preg Test, Ur: NEGATIVE

## 2011-01-11 LAB — HEMOCCULT GUIAC POC 1CARD (OFFICE): Fecal Occult Bld: NEGATIVE

## 2011-02-17 NOTE — Op Note (Signed)
[name], [name]                  ACCOUNT NO.:  [id number]   MEDICAL RECORD NO.:  [id number]          PATIENT TYPE:  INP   LOCATION:  6729                         FACILITY:  [facility]   PHYSICIAN:  Ileene Hutchinson T. Erik Obey, M.D. DATE OF BIRTH:  December 20, 1960   DATE OF PROCEDURE:  06/05/2009  DATE OF DISCHARGE:                               OPERATIVE REPORT   PREOPERATIVE DIAGNOSES:  1. Bilateral ear lesions.  2. Right tongue tip lesion.   POSTOPERATIVE DIAGNOSES:  1. Bilateral ear lesions.  2. Right tongue tip lesion.   PROCEDURES PERFORMED:  1. Left pinna incisional biopsy with closure.  2. Right tongue tip biopsy with closure.   SURGEON:  Marikay Alar. Erik Obey, MD   ANESTHESIA:  Intravenous sedation/MAC.   ESTIMATED BLOOD LOSS:  None.   COMPLICATIONS:  None.   FINDINGS:  A necrotic desquamating area of the bilateral pinna, left  worse than right.  No frank pus.  Prior to local anesthesia, still very  tender.  Apparently granulation type ulceration of the right tongue tip.   PROCEDURE IN DETAIL:  With the patient in a semi-sitting position,  receiving intravenous sedation, the ears were examined with the findings  as described above.  Xylocaine 1% with 1:100,000 epinephrine was  infiltrated into the retroauricular crease, 5 mL total for local  anesthesia.  The same agent was applied deep to the tongue tip lesion,  approximately 3 mL.  Several minutes were allowed for this to take  effect.  A sterile preparation and draping of left ear was accomplished.   Approaching the left ear, it was still quite tender.  Additional 1%  Xylocaine with 1:100,000 epinephrine, 4 mL total was infiltrated into  the perichondrial layer around the left helix of the pinna.  Several  additional minutes were allowed for this to take effect.   Upon ascertaining adequate anesthesia, a elongated incisional wedge was  made in the antihelical fold taking care to encompass some normal tissue  and into the necrotic  appearing area.  The size of the specimen was  roughly 30 mm x 4 mm wide x 4 mm deep including some cartilage.  This  was sent off in sterile saline to be divided by pathology for cultures  and histopathology including bacteria, fungus, and acid-fast bacilli.  There was no significant bleeding.  The wound was closed with a running  simple 4-0 chromic stitch.  Bacitracin ointment was applied and this was  completed.   Attention was turned to the mouth.  A wedge biopsy approximately 1 cm  wide x 6 mm deep was performed across the tongue tip and the specimen  was delivered for the same treatment as the ear lesion.  Again, no  significant bleeding.  The lesion was closed with interrupted 4-0  chromic sutures.  The patient tolerated the entire procedure nicely.  At  this point, hemostasis was observed in both sites.  The procedure was  completed and the patient was returned to Anesthesia, awakened, and  transferred to recovery.   COMMENTS:  A 50 year old homeless black female with multiple medical  conditions including HIV, now under concern for possible perichondritis  hence the indication for today's biopsies.  Our further treatment plans  are based on the biopsy results.  For now, we will use ice, elevation,  and analgesia and continue with her antibiotic coverage.      Marikay Alar. Erik Obey, M.D.  Electronically Signed     KTW/MEDQ  D:  06/05/2009  T:  06/06/2009  Job:  [id number]   cc:   Alcide Evener, MD

## 2011-02-17 NOTE — Discharge Summary (Signed)
NAMEGENEVIVE, [name]                  ACCOUNT NO.:  [id number]   MEDICAL RECORD NO.:  [id number]          PATIENT TYPE:  INP   LOCATION:  7972                         FACILITY:  [facility]   PHYSICIAN:  Jacquelynn Cree, M.D.   DATE OF BIRTH:  [birth year]   DATE OF ADMISSION:  05/11/2009  DATE OF DISCHARGE:                               DISCHARGE SUMMARY   PRIMARY CARE PHYSICIAN:  Barton Fanny, MD at [facility].   DISCHARGE DIAGNOSES:  1. Left ear cellulitis involving the pinna.  2. History of rheumatoid arthritis.  3. Antinuclear antibody positive.  4. B12 deficiency.  5. Iron deficiency.  6. Normocytic anemia.  7. Moderate protein-calorie malnutrition.  8. Vaginal candidiasis.   DISCHARGE MEDICATIONS:  1. Clindamycin 300 mg p.o. q.6 h. x10 days.  2. Iron sulfate 325 mg p.o. b.i.d.  3. Ibuprofen 400 mg p.o. q.6 h. p.r.n.  4. Neosporin topically to left pinna t.i.d.   CONSULTATIONS:  Melissa Montane, MD of Otolaryngology.   BRIEF ADMISSION HISTORY OF PRESENT ILLNESS:  The patient is a 50-year-  old homeless female who presented to the hospital with bilateral ear  pain and burning, she was seen in the emergency department and  subsequently discharged home 4 days prior to representation on May 12, 2009.  She developed increasing left ear pain and erythema and  subsequently was noted to have a severe cellulitis involving the left  ear.  The patient subsequently was referred to the hospitalist service  for admission.  For the full details, please see the dictated report  done by Dr. Stark Jock.   PROCEDURES AND DIAGNOSTIC STUDIES:  Chest x-ray on May 11, 2009,  showed no active cardiopulmonary disease.   DISCHARGE LABORATORY VALUES:  Chemistries were completely normal with  the exception of a slightly low albumin at 2.5.  White blood cell count  was 5.2, hemoglobin 9.1, hematocrit 27.1, platelets 535.  ANA was  positive with a 1:40 titer, homogenous pattern, felt to be not  clinically significant.  Blood cultures were negative.  Iron was less  than 10.  UIBC 137.  Ferritin was 98.  Folic acid was 5.2.  Vitamin B12  was 210.  TSH was 1.929.  Rheumatoid factor was less than 20.   HOSPITAL COURSE BY PROBLEM:  1. Left ear cellulitis:  The patient was admitted and put on broad-      spectrum antibiotics consisting of vancomycin and Zosyn.  Because      of concerns of possible underlying vasculitis, an ENT consult was      requested and kindly provided by Dr. Redmond Baseman.  He felt that local      wound care and empiric antibiotics would clear up the infection and      did not specifically feel there was underlying vasculitis.  The      patient improved with empiric antibiotic therapy.  He did recommend      outpatient dermatology consultation if the cellulitis failed to      resolve.  The patient completed 4 days of treatment with vancomycin  and Zosyn and was transitioned over to p.o. clindamycin.  She will      complete another 10 days of treatment with clindamycin for a total      treatment course of 14 days.  2. Questionable history of rheumatoid arthritis/ANA positivity:  The      patient's rheumatoid factor was less than 20 here.  She did have a      non-clinically significant ANA titer.  She would likely benefit      from outpatient referral to a rheumatologist.  The patient was not      noted to have any active synovitis while in the hospital.  3. Multifactorial anemia/B12 deficiency/iron deficiency:  The patient      was put on iron supplementation.  She also received parenteral      vitamin B12 supplementations, 1000 mcg IM daily from May 12, 2009, through May 17, 2009.  4. Moderate protein-calorie malnutrition:  The patient was seen in      consultation with the dietician.  She did have weight loss over      time.  Recommendations were to provide the patient with Ensure      supplementation.  The patient encouraged to continue this after       discharge.  The patient's weight was 38.10 kg on admission.  5. Vaginal candidiasis:  The patient complained of vaginal pruritus      after being on high-potency IV antibiotics.  She was empirically      treated for vaginal candidiasis with resolution of her symptoms.   DISPOSITION:  The patient is medically stable and is requesting  discharge home.  Although she is reportedly homeless, she does not wish  to go to an assisted living facility despite the fact that she does have  a bed offer at [facility].  She is competent to make her own decisions  and therefore, we will discharge her home.  A followup appointment has  been made for her with Ellsworth Lennox at [facility] on June 11, 2009, at 2:45 p.m.  The patient stated that she would be able to get her  prescription filled for clindamycin despite being uninsured.   Time spent coordinating care for discharge and the discharge  instructions equals 35 minutes.      Jacquelynn Cree, M.D.  Electronically Signed     CR/MEDQ  D:  05/17/2009  T:  05/17/2009  Job:  [id number]   cc:   Barton Fanny, M.D.

## 2011-02-17 NOTE — Consult Note (Signed)
[name], [name]                  ACCOUNT NO.:  [id number]   MEDICAL RECORD NO.:  [id number]          PATIENT TYPE:  INP   LOCATION:  6702                         FACILITY:  [facility]   PHYSICIAN:  Gavin Pound, MD      DATE OF BIRTH:  06/23/1961   DATE OF CONSULTATION:  05/31/2009  DATE OF DISCHARGE:                                 CONSULTATION   REQUESTING PHYSICIAN:  Dr. Mills/Dr. Tommy Medal   REASON FOR CONSULTATION:  Ear pain/swelling, polyarthritis, weight loss.   CHIEF COMPLAINT:  Polyarthritis.   HISTORY OF PRESENT ILLNESS:  This 50 year old female with 2-3 month  history of polyarthritis with joint pain and swelling of her hand,  wrist, feet, ankles, and knees after a dog bite to the left lower leg.  During this time, the patient also noted a 40-pound weight loss, which  has subsided at some point as the patient is no longer losing weight.  She has also noted over the last month bilateral ear pain and swelling  with itching of the ear and ulcerations and bleeding of the earlobe with  scabs noted.  The patient also is homeless and she has recent/current  smoking of cocaine.  The patient also complains of diarrhea, occasional  nosebleeds, rare hemoptysis.  The patient denies any hair loss, no dry  eyes, no dry mouth, no oral ulcers.  She denies ever having a red  painful eye.  No rashes, no history of psoriasis, no chest pain, no  shortness of breath, no abdominal pain, no loss of appetite, no blood in  the stools, no blood or foam in the urine, no Raynaud's.  She states she  had an occasional subjective fevers, but never checked her temperature  and she has been afebrile on admission.  The patient had a recent  hospital admission a few weeks ago for the ear pain and redness, and was  noted to possibly have cellulitis of the ears and was given antibiotics,  and it is unclear if the patient took the antibiotics or not.   REVIEW OF SYSTEMS:  As above.  Otherwise, all other systems  were  reviewed and negative.   FAMILY HISTORY:  No family history of autoimmune disease.   SOCIAL HISTORY:  Smokes cocaine currently, minimal smoking of  cigarettes, no alcohol.   ALLERGIES:  None.   MEDICATIONS:  See medication list.   PHYSICAL EXAMINATION:  VITALS:  Stable.  She is afebrile.  GENERAL:  She is cachectic and alert and oriented x3.  HEENT:  She has erythema and edema.  Bilateral earlobes with some small  scabs and areas of cartilage loss where scabs have healed.  The earlobes  are diffusely painful to palpation with the left earlobe being worse  than the right.  It is noted that the pinna is repaired.  CARDIOVASCULAR:  Regular rate and rhythm.  S1 and S2.  LUNGS:  Clear to auscultation bilaterally.  ABDOMEN:  Soft, nontender, nondistended.  MUSCULOSKELETAL:  Positive synovitis in bilateral hands.  PIP joints  normal.  MCP, synovitis with decreased range of motion  in bilateral  wrists.  Decreased range of motion with flexion contractures due to pain  in bilateral elbows.  Swelling in bilateral knees.  Pain with mild  edema, bilateral ankles.  No swelling of toes, but tender to palpation.  SKIN:  She has a scar on the left lower leg from the previous dog bite  and the earlobes as above.   LABORATORY DATA:  ANA 1:320.  CCP negative.  UA negative.  CRP 4.6.  ESR  96.  TSH 1.062.  HIV negative.  LDH 168.  Rheumatoid factor negative.  Calcium 7.9, albumin 2.0, total protein 5.6, sodium 136, potassium 3.5,  chloride 106, bicarb 24, BUN 7, creatinine 0.5, glucose 91.  White count  4.7, hemoglobin 8.2, hematocrit 23.8, platelet count 414.  Iron studies  showing iron-deficiency anemia.  Blood cultures from May 11, 2009,  negative.  LFTs were normal.  INR 1.3.   Chest x-ray normal without infiltrates.  Hands x-ray showed no erosions,  but some edema of the PIP joint.   ASSESSMENT AND PLAN:  This is an unfortunate 50 year old female with  diffuse polyarthritis, weight  loss, ear pain and swelling, but also with  infectious disease risk factors such as drug use, homelessness, and  recent dog bite.  There is suspicion with this patient's diffuse  polyarthritis with active synovitis of multiple joints, weight loss, and  ear pain and swelling that she may have an autoimmune disease.  Differential diagnoses includes reactive arthritis versus systemic lupus  erythematosus versus vasculitis versus infectious disease causes with  possible coexisting relapsing polychondritis.  I recommend a full  infectious disease workup as many of her autoimmune diseases are  mimicked by infection and we will rule out infectious causes and  infections prior to administering an immunosuppressants.  We will check  an echo.  I agree with the chest, abdomen, and pelvis CT.  I believe  blood and urine cultures have been sent.  I have ordered with the  resident multiple autoimmune serologies, which are pending including, I  would also recommend as well if not done, dsDNA, SSA, SSB, anti-Smith,  anti-RNP, p-ANCA, c-ANCA, MPO, PR3, beta-2-glycoprotein 1, lupus  anticoagulant, anticardiolipin antibody, hepatitis, and HIV is negative.  At this time, since the patient is stable and she has no evidence of  kidney involvement or pulmonary involvement, possible vasculitis that we  continue NSAIDs for joint pain while additional workup is pending.  I  agree that the patient will need prednisone at some point for her joint  pain, but the dose will depend on additional workup and I would not want  to mask any vasculitis or clues into a specific autoimmune disease that  we may miss if we start low-dose prednisone too early.  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  I  will continue to follow this interesting patient with you.   Thank you very much for this consult.      Gavin Pound, MD  Electronically Signed     AH/MEDQ  D:  06/01/2009  T:  06/02/2009  Job:  [id number]

## 2011-02-17 NOTE — H&P (Signed)
[name], [name]                  ACCOUNT NO.:  [id number]   MEDICAL RECORD NO.:  [id number]          PATIENT TYPE:  INP   LOCATION:  0388                         FACILITY:  [facility]   PHYSICIAN:  Jude Ojie, MD          DATE OF BIRTH:  [birth year]   DATE OF ADMISSION:  05/12/2009  DATE OF DISCHARGE:                              HISTORY & PHYSICAL   PRIMARY CARE PHYSICIAN:  The patient is unassigned.   CHIEF COMPLAINT:  Bilaterally ear pain, swelling, and discharge.   HISTORY OF PRESENTING COMPLAINT:  The patient is a 50 year old with a  history of rheumatoid arthritis and anemia who was initially seen here  in the emergency room 4 days ago for bilateral ear pain and burning  sensation, subsequently discharged home, presented again today with  worsening of the pain, burning of the bilateral ears, more in the left  ear and some associated drainage and discharge from the left ear.  Of  note, the patient claims that she has lost a significant amount of  weight of more than 10 pounds in the last 1 month.  Also, the patient  complains of having multiple joint pains involving ankle joints  bilaterally, the knee joints, and the joints of the fingers with  proximal interphalangeal joint and metacarpophalangeal joint  involvement.  The patient also complains of having early morning joint  pains and stiffness when she wakes up in the morning and this usually  gets better as the day goes by.  The patient was evaluated in the ED and  found to have a potassium level of 2.9 which has already been replaced  with 40 mEq of potassium chloride.  Blood cultures have also been drawn.  The patient was given IV Unasyn empirically to cover her possible  cellulitis.  However, there is a strong consideration for possibility of  vasculitis given the patient's prior history of rheumatoid arthritis  presenting with these significant pain and possible destruction of the  left ear.  Plan is to admit the patient to  [facility] for  further management and investigation with plans to consult Rheumatology  an ENT for further evaluation in the morning.  Of note, the patient was  noted to have a low-grade temperature here in the ED with a temperature  of 100.0.   PAST MEDICAL HISTORY:  Significant for:  1. Rheumatoid arthritis.  2. Anemia.   ALLERGIES:  The patient does have allergies to TYLENOL, causes  generalized swelling.   OUTPATIENT MEDICATION:  Ibuprofen as needed.   FAMILY HISTORY:  The patient lives with her boyfriend, has no children.  Family history is unremarkable.   SOCIAL HISTORY:  The patient does smoke less than half a pack a day of  cigarettes, started smoking at age of 44 years.  Denies any history of  alcohol or IV drug use.   REVIEW OF SYSTEMS:  GENERAL:  Positive for low-grade fevers and weight  loss. RESPIRATORY:  No shortness of breath or cough productive of  sputum.  CVS:  No chest pain or  palpitation.  GI:  No nausea, vomiting,  or diarrhea.  ENDOCRINE:  No polyuria, polydipsia, or polyphagia.  GENITOURINARY:  No dysuria, frequency, or urgency.  SKIN:  No skin  bruising or epistaxis.  PSYCH:  No depression.  MUSCULOSKELETAL:  Positive for multiple joint pains and stiffness with some muscle aches.  Also having pain involving bilateral ears, the left greater than the  right.  NEURO:  The patient denies any headaches, seizures, or syncope.   PHYSICAL EXAMINATION:  VITAL SIGNS:  On presentation, blood pressure  136/85, pulse rate of 118, respiratory rate of 24, temperature of 100.0,  and O2 sat of 100%.  GENERAL:  Found a middle-aged African American lady, cachectic,  currently in mild-to-moderate distress from left ear pain.  HEENT:  Pupils are equal, round, and reactive to light and accommodation  bilaterally.  Also noted on bilateral ears some slight increase in  swelling, redness, and mild purulent discharge from bilateral ears  mostly affecting the left  ear.  Also, presence of more swelling on the  left ear with some drainage as stated above.  NECK:  Supple.  No JVD.  RESPIRATORY:  Chest is clear to auscultation bilaterally.  CVS:  Heart sounds 1 and 2.  Regular rate and rhythm.  No murmurs or  gallops.  ABDOMEN:  Flat and nontender.  No organomegaly.  Bowel sounds positive  in all quadrant.  EXTREMITIES:  No cyanosis, clubbing, or edema.  Pedal pulses palpable  bilaterally.  NEURO:  The patient is awake, alert, oriented to time, place, and  person.  Moving all extremities.  No focal findings noted at this time.   LABORATORY DATA:  CBC with a white count of 4.7, hemoglobin of 8.9,  hematocrit 25.4, and platelet of 442.  BMP with sodium of 131, baseline  sodium was 139 about 4 days ago, potassium 2.9, chloride of 100, bicarb  24, BUN 9, creatinine 0.59, glucose of 149, calcium 8.4, albumin 2.5.  CK total is 90, INR 1.3, PT 15.6, PTT 30.  EKG showed sinus tachycardia  with rates of 124.  Chest x-ray with no acute cardiopulmonary disease.   ASSESSMENT:  1. Bilateral ear left greater than the right cellulitis, rule out      possible vasculitic process.  There appears to be some destructive      process going on, mostly in the left ear.  Possible consideration      includes Wegener granulomatosis, polychondritis.  Especially given      the patient's prior history of rheumatoid arthritis, there is a      possibility of an associated vasculitic process going on.  2. Hypokalemia with potassium of 2.9.  3. Hyponatremia with sodium of 131.  4. History of anemia, here with hemoglobin of 8.9.  5. Sinus tachycardia.   PLAN:  1. We will admit to inpatient.  2. We will place on empiric IV antibiotics with IV vancomycin and      Unasyn until the patient is evaluated by Infectious Disease      regarding guidance on antibiotic management.  We would defer to      primary team to call ID in the morning as it is about 1 a.m. right      now to  assist with antibiotics management.  3. We will check rheumatoid factor, ANA level, ESR, and C-reactive      protein.  4. We would also consider getting an ENT consultation to evaluate the  left ear if there is any possible surgical intervention that the      patient can benefit from.  Also, we would recommend getting      rheumatology consult in the morning to assist with evaluation as      there is likely associated vasculitic process going on and also to      assist with interpretation of the labs that have been ordered      already including the ANA, rheumatoid factor, and ESR.  5. We will check iron studies to include a serum iron, TIBC, and      ferritin.  Also check a B12 and folate level in the morning for      evaluation of the patient's anemia.  6. The potassium has already been replaced.  We will check a potassium      level in the morning.  We will also check urine beta-hCG to rule      out an underlying pregnancy.  7. Prophylaxis.  The patient has been placed on Lovenox.  8. To follow up with results of her blood culture that I have been      drawn already in the ER.   Case and plan was discussed extensively with the patient and the  boyfriend at the bedside.  All questions were answered.  They voiced  understanding and they are in agreement with the plan of care.      Jude Stark Jock, MD  Electronically Signed     Jude Stark Jock, MD  Electronically Signed    JO/MEDQ  D:  05/12/2009  T:  05/12/2009  Job:  [id number]

## 2011-02-18 ENCOUNTER — Emergency Department (HOSPITAL_COMMUNITY)
Admission: EM | Admit: 2011-02-18 | Discharge: 2011-02-19 | Disposition: A | Discharge status: Home / Self Care | Payer: Medicaid Other | Attending: Emergency Medicine | Admitting: Emergency Medicine

## 2011-02-18 DIAGNOSIS — R21 Rash and other nonspecific skin eruption: Secondary | ICD-10-CM | POA: Insufficient documentation

## 2011-02-18 DIAGNOSIS — I10 Essential (primary) hypertension: Secondary | ICD-10-CM | POA: Insufficient documentation

## 2011-02-18 DIAGNOSIS — F341 Dysthymic disorder: Secondary | ICD-10-CM | POA: Insufficient documentation

## 2011-02-18 DIAGNOSIS — Z79899 Other long term (current) drug therapy: Secondary | ICD-10-CM | POA: Insufficient documentation

## 2011-02-18 DIAGNOSIS — M79609 Pain in unspecified limb: Secondary | ICD-10-CM | POA: Insufficient documentation

## 2011-02-27 ENCOUNTER — Encounter: Payer: Self-pay | Admitting: Internal Medicine

## 2011-03-26 IMAGING — CR DG ABDOMEN ACUTE W/ 1V CHEST
3 series · 3 of 3 positions shown · non-contrast
Comparison: None

CLINICAL DATA: Right side abdominal pain

ACUTE ABDOMEN SERIES (ABDOMEN 2 VIEW & CHEST 1 VIEW)

[w chest pa]
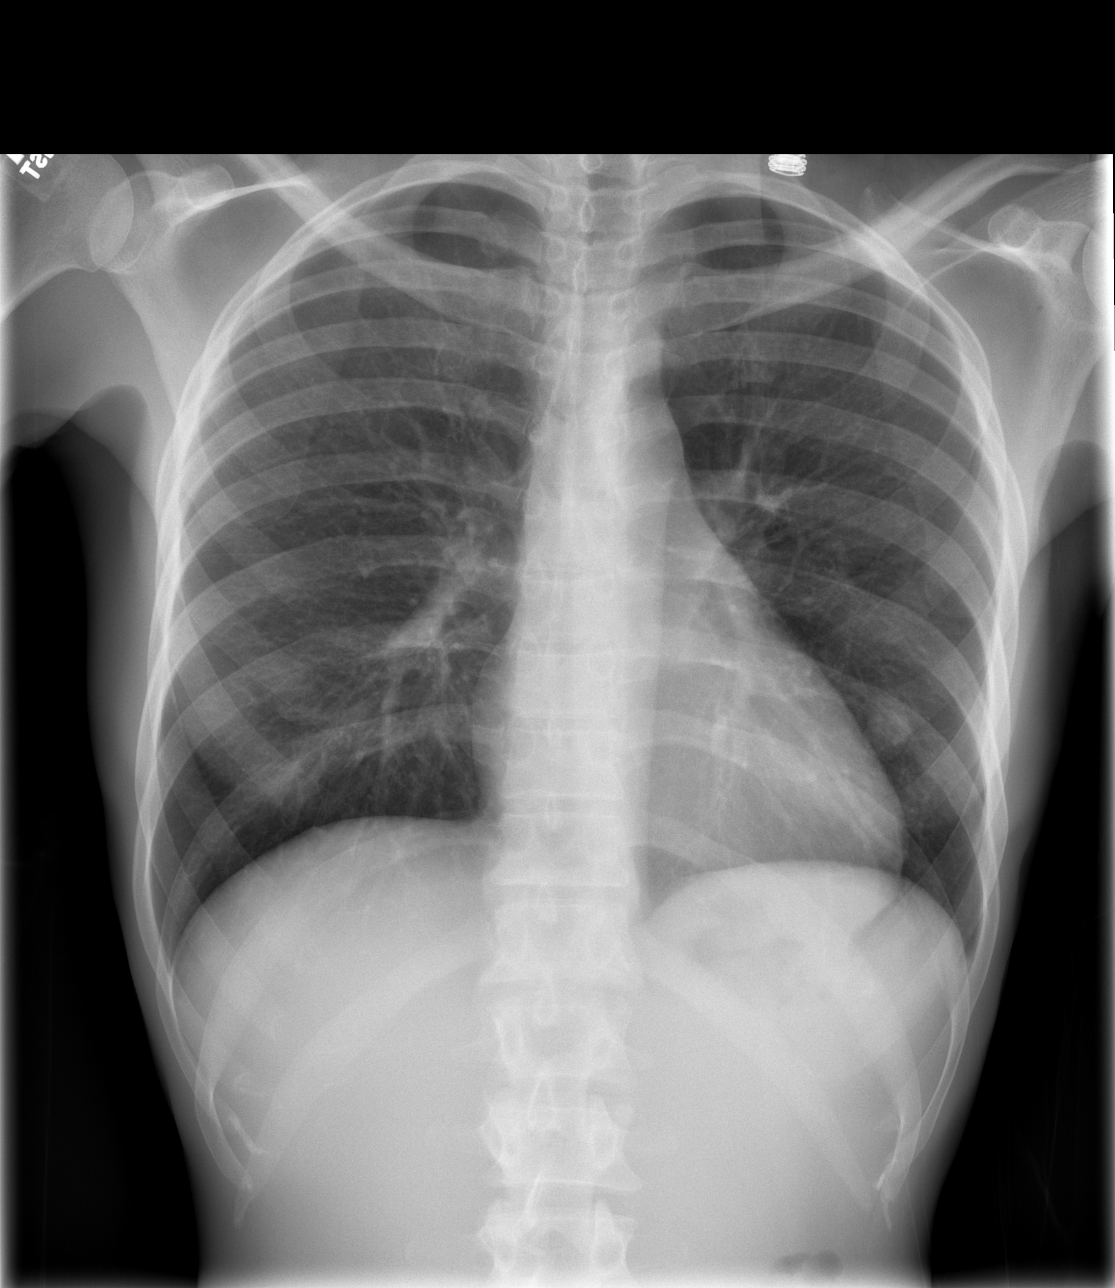

[w abdomen upright]
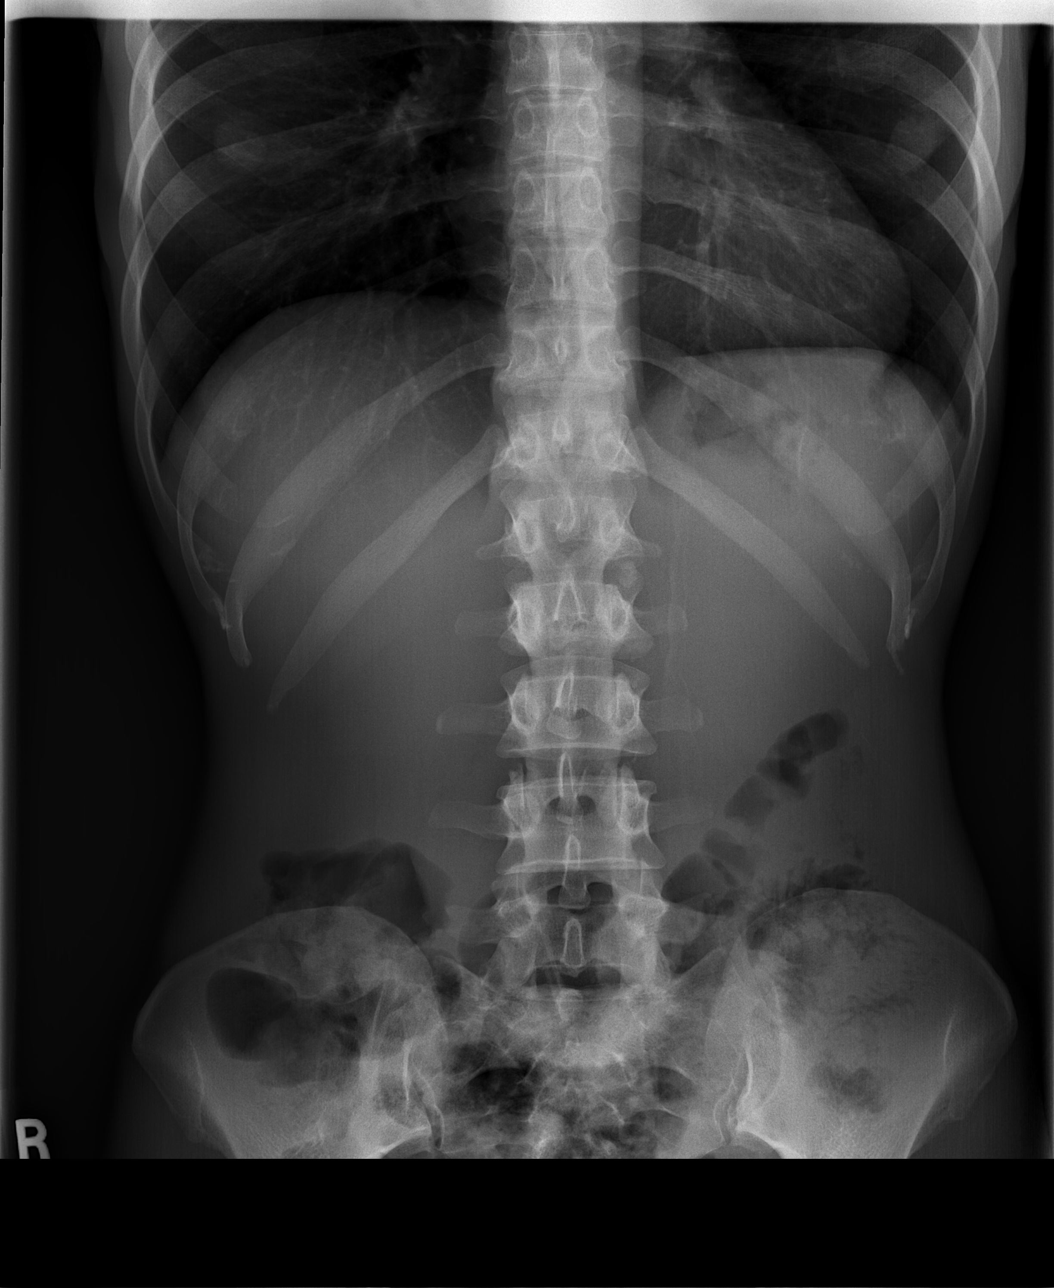

[t abdomen supine]
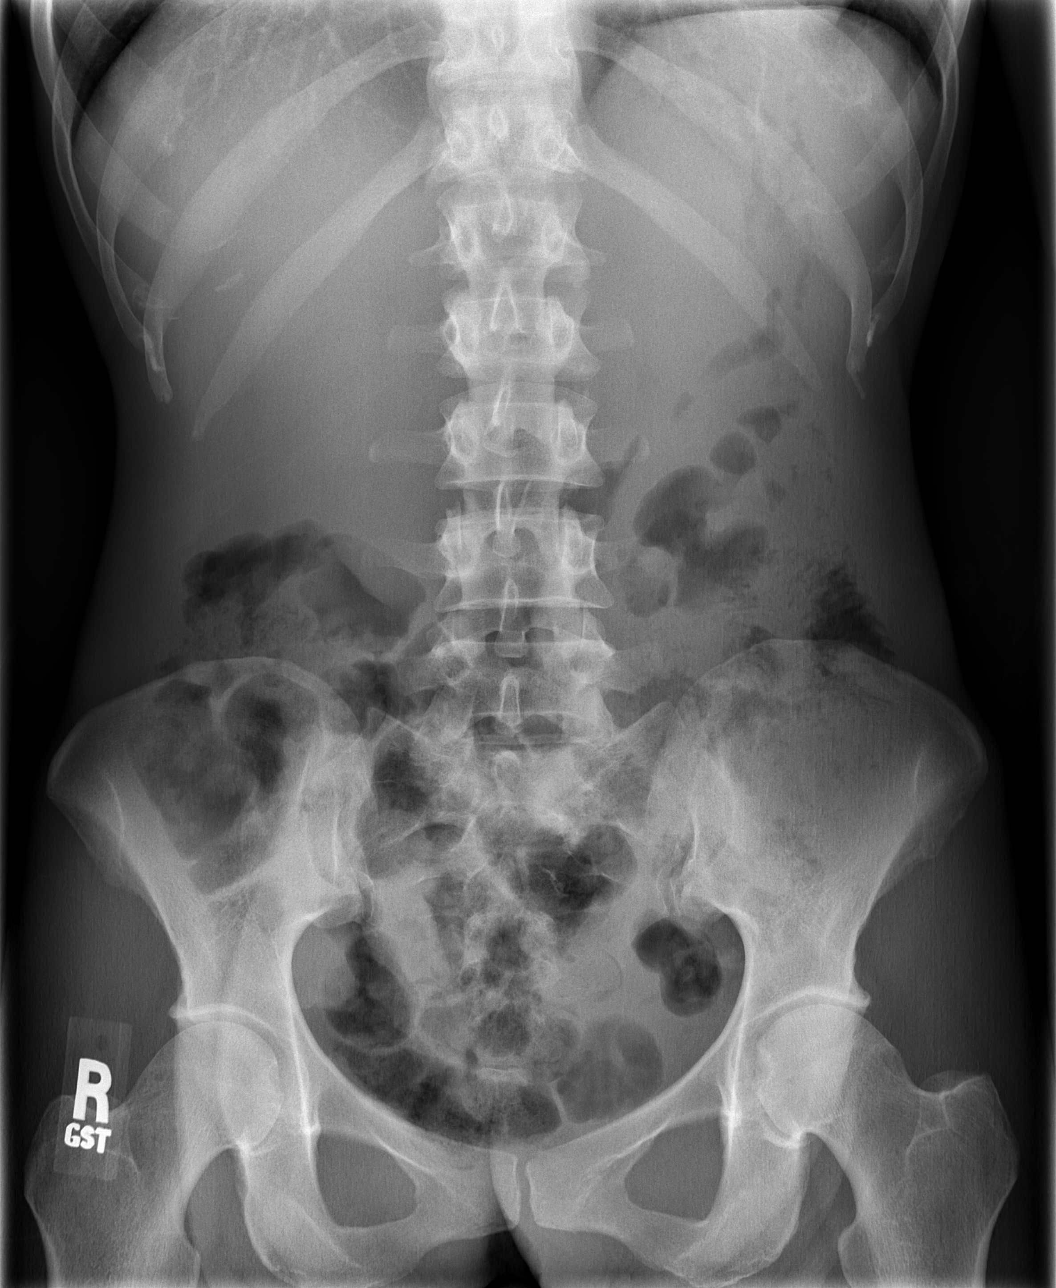

[3 of 3 positions shown; findings below may reference images not displayed]

FINDINGS: Upper normal heart size.
Normal mediastinal contours and pulmonary vascularity.
Probable bilateral nipple shadows.
No pulmonary infiltrate or pleural effusion.
Normal bowel gas pattern.
Stool present to rectum.
No bowel dilatation, bowel wall thickening or free air.
Bones unremarkable.
No pathologic calcification.
IMPRESSION: No acute abnormalities.

## 2011-03-27 IMAGING — CT CT ABDOMEN W/ CM
2 of 5 series · 17 of 46 positions shown, 19 images · IV contrast (agent unspecified)
Comparison: None

CT ABDOMEN

CLINICAL DATA: Abdominal and right flank pain

CT ABDOMEN AND PELVIS WITH CONTRAST
TECHNIQUE: Multidetector CT imaging of the abdomen and pelvis was
performed using the standard protocol following bolus
administration of intravenous contrast. Breast shield utilized.
Sagittal and coronal MPR images reconstructed from axial data set.
Contrast: Dilute oral contrast and 80 ml Bmnipaque-IEE

[Series 2: abd/pelv with 5.0 b31f st · axial · 0.66mm/px · z∈[-446,-86]mm · 14 of 82 slices shown, 16 images]
[im 5/82  soft-tissue]
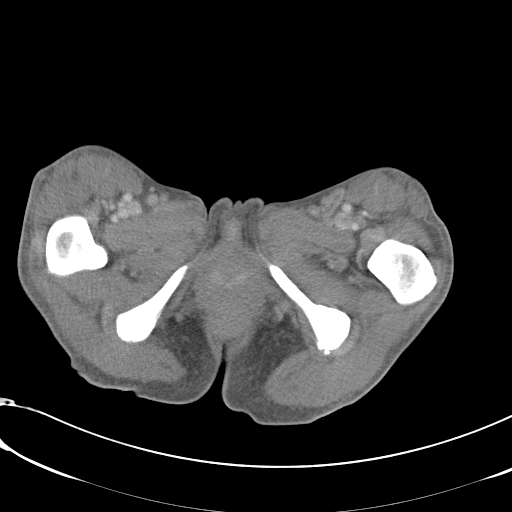
[im 5/82  bone]
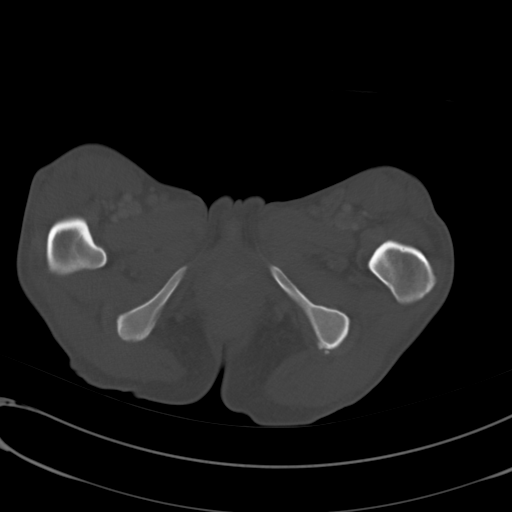
[im 9/82  soft-tissue]
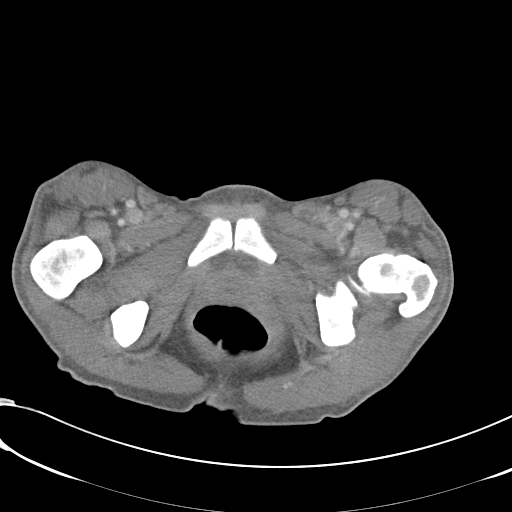
[im 18/82  soft-tissue]
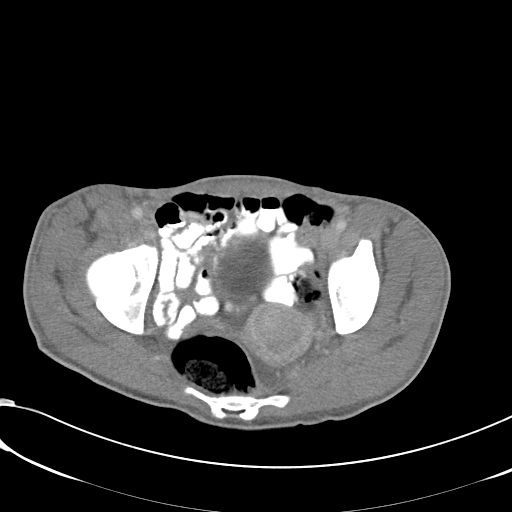
[im 22/82  soft-tissue]
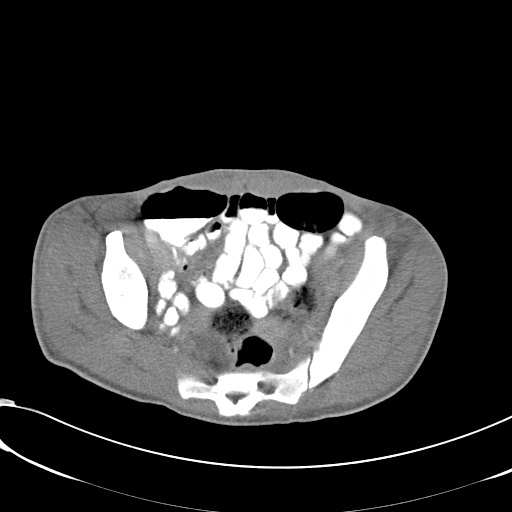
[im 26/82  soft-tissue]
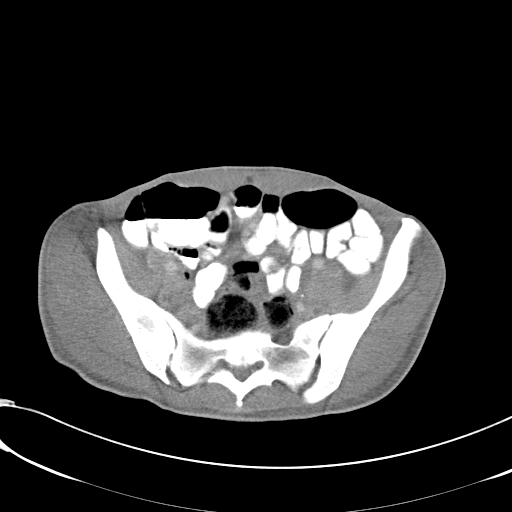
[im 35/82  soft-tissue]
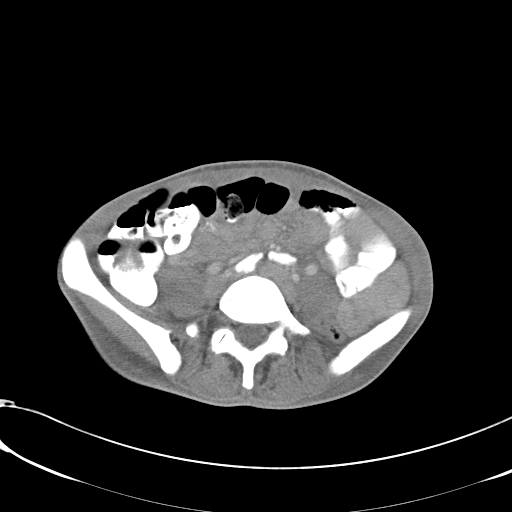
[im 39/82  soft-tissue]
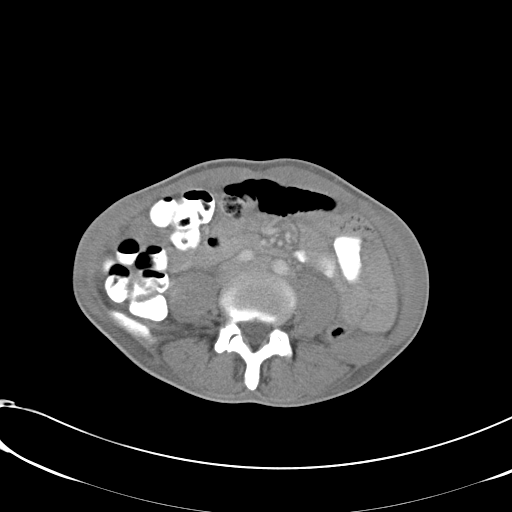
[im 43/82  soft-tissue]
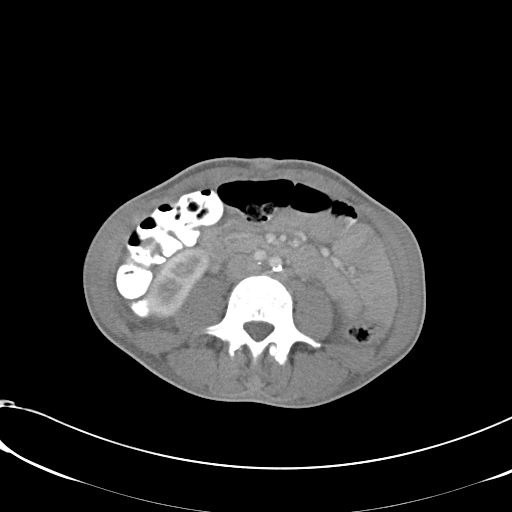
[im 47/82  soft-tissue]
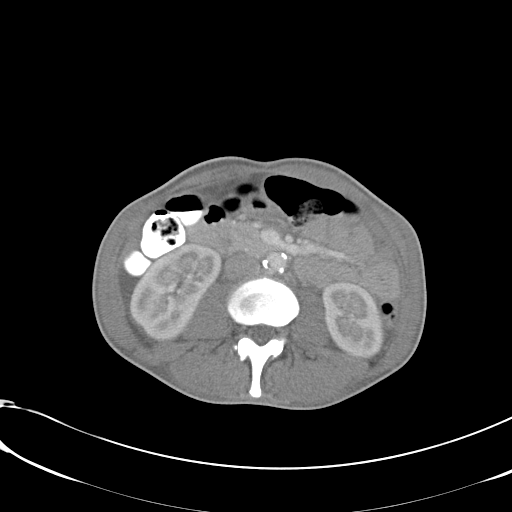
[im 47/82  bone]
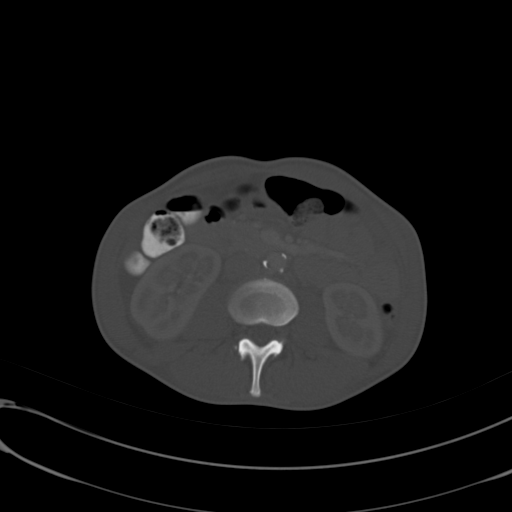
[im 56/82  soft-tissue]
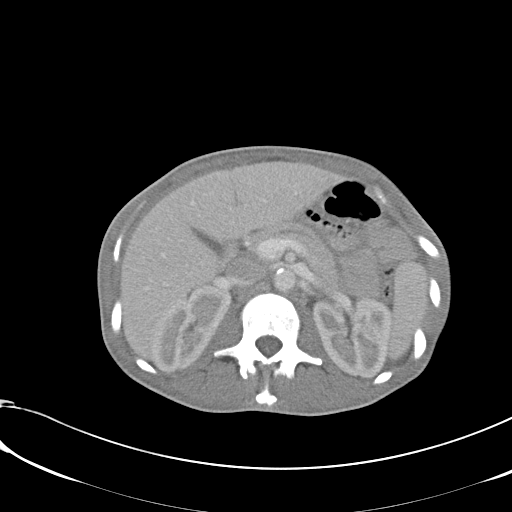
[im 60/82  soft-tissue]
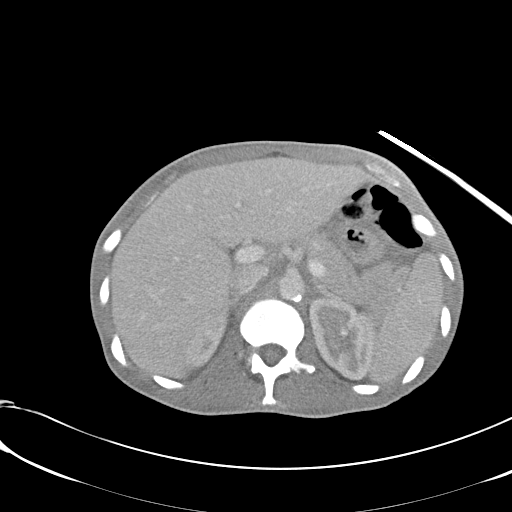
[im 64/82  soft-tissue]
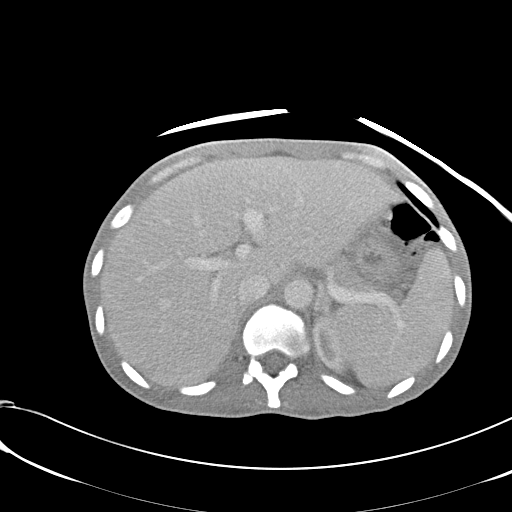
[im 73/82  soft-tissue]
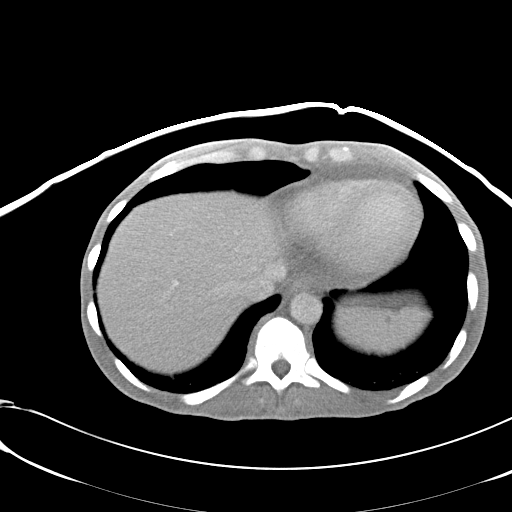
[im 77/82  soft-tissue]
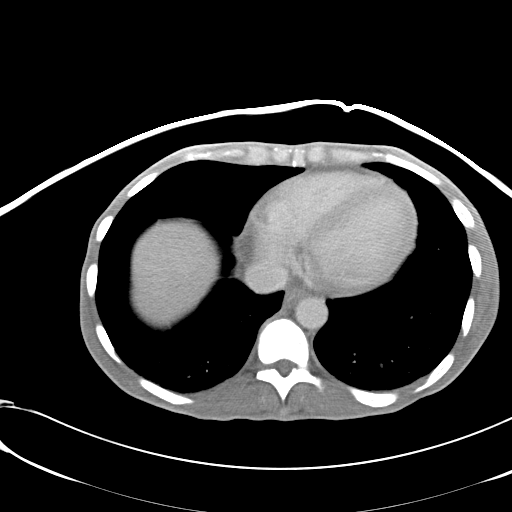

[Series 602: coronals · coronal · 0.79mm/px · 3 of 84 slices shown]
[im 28/84  soft-tissue]
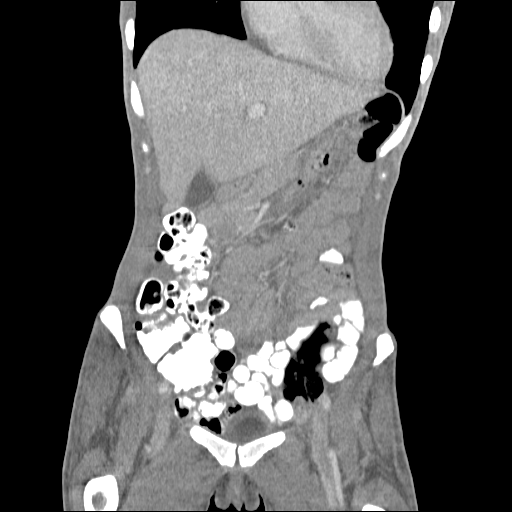
[im 37/84  soft-tissue]
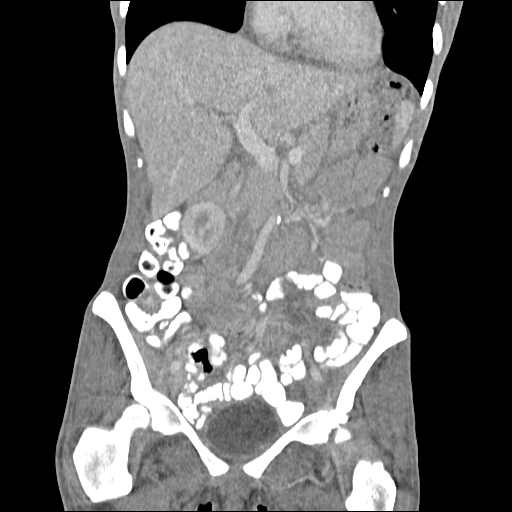
[im 47/84  soft-tissue]
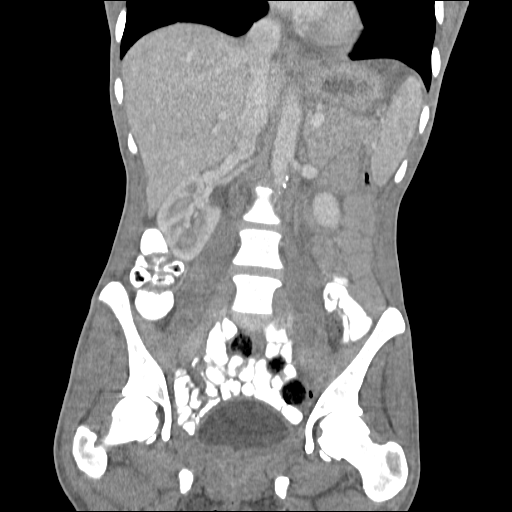

[17 of 46 positions shown; findings below may reference images not displayed]

FINDINGS: Minimal dependent atelectasis at lung bases.
Small pericardial effusion.
Mild diffuse edema of soft tissue planes throughout abdomen.
No focal abnormalities of liver, spleen, pancreas, kidneys, or
adrenal glands.
No urinary tract calcification or dilatation.
Delayed images of the kidneys demonstrate minimal inhomogeneity of
right renal cortex laterally, question related to beam hardening
from adjacent dense contrast in right colon.
Right nephrogram is normal in appearance on portal venous phase
images.
Scattered atherosclerotic calcifications aorta without aneurysm.
Upper abdominal bowel loops grossly unremarkable.
Stomach unopacified and under distended, grossly unremarkable.
No mass, adenopathy, or free fluid.
IMPRESSION: Small pericardial effusion.
Scattered soft tissue edema without focal upper abdominal
abnormality.

CT PELVIS
FINDINGS: Scattered soft tissue edema with minimal ascites.
Normal appendix.
Question uterine fibroid at fundus, 3.3 x 3.0 cm image 65.
Unremarkable bladder and distal ureters.
Large and small bowel loops unremarkable.
Upper normal-sized inguinal lymph nodes.
No mass, hernia, or focal bony abnormality.
IMPRESSION: Probable uterine fibroid 3.3 cm greatest size.
Scattered soft tissue edema and minimal ascites.

## 2011-04-21 IMAGING — CR DG CHEST 2V
2 series · 2 of 2 positions shown · non-contrast
Comparison: None

CLINICAL DATA: Cough and ear pain.

CHEST - 2 VIEW

[w chest pa]
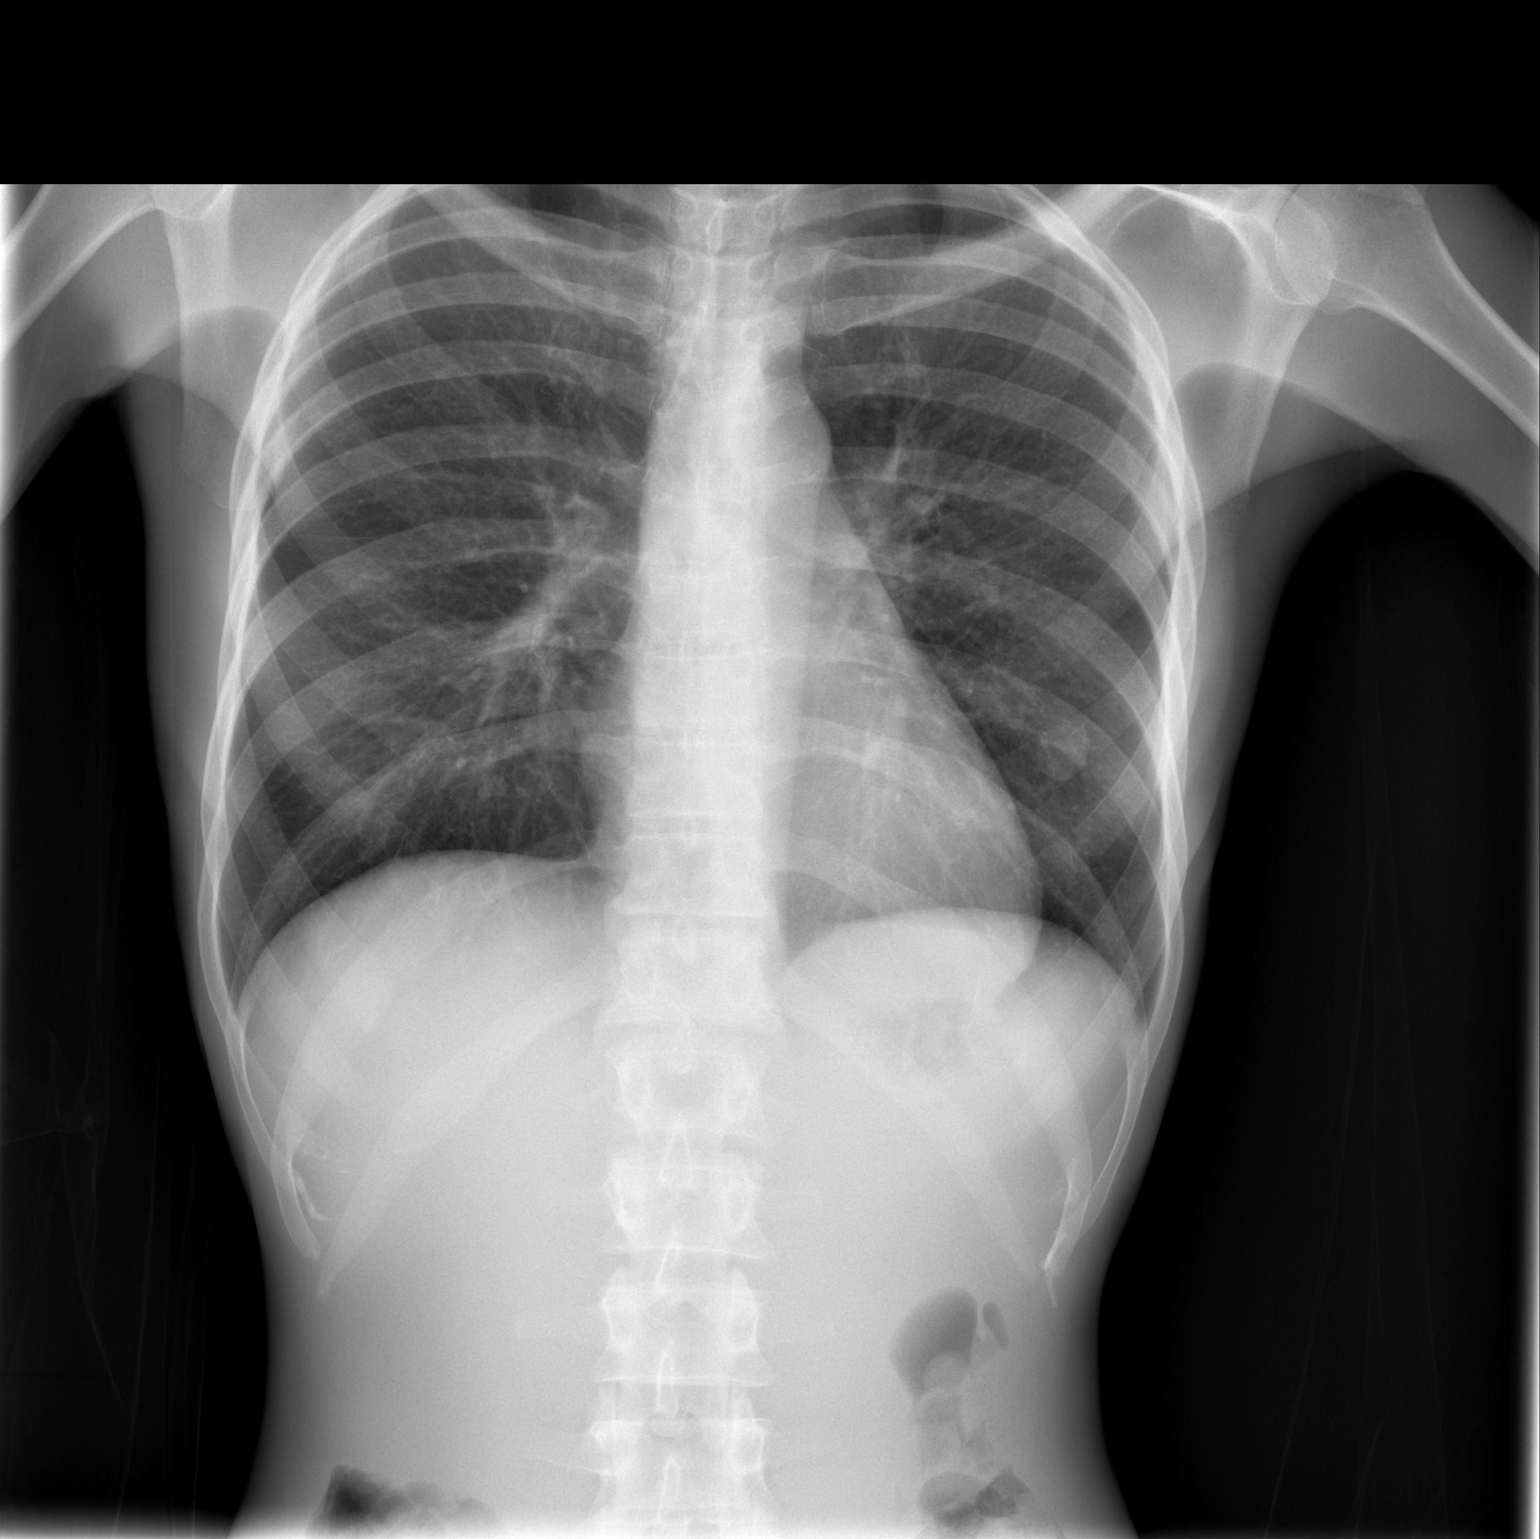

[w chest lat]
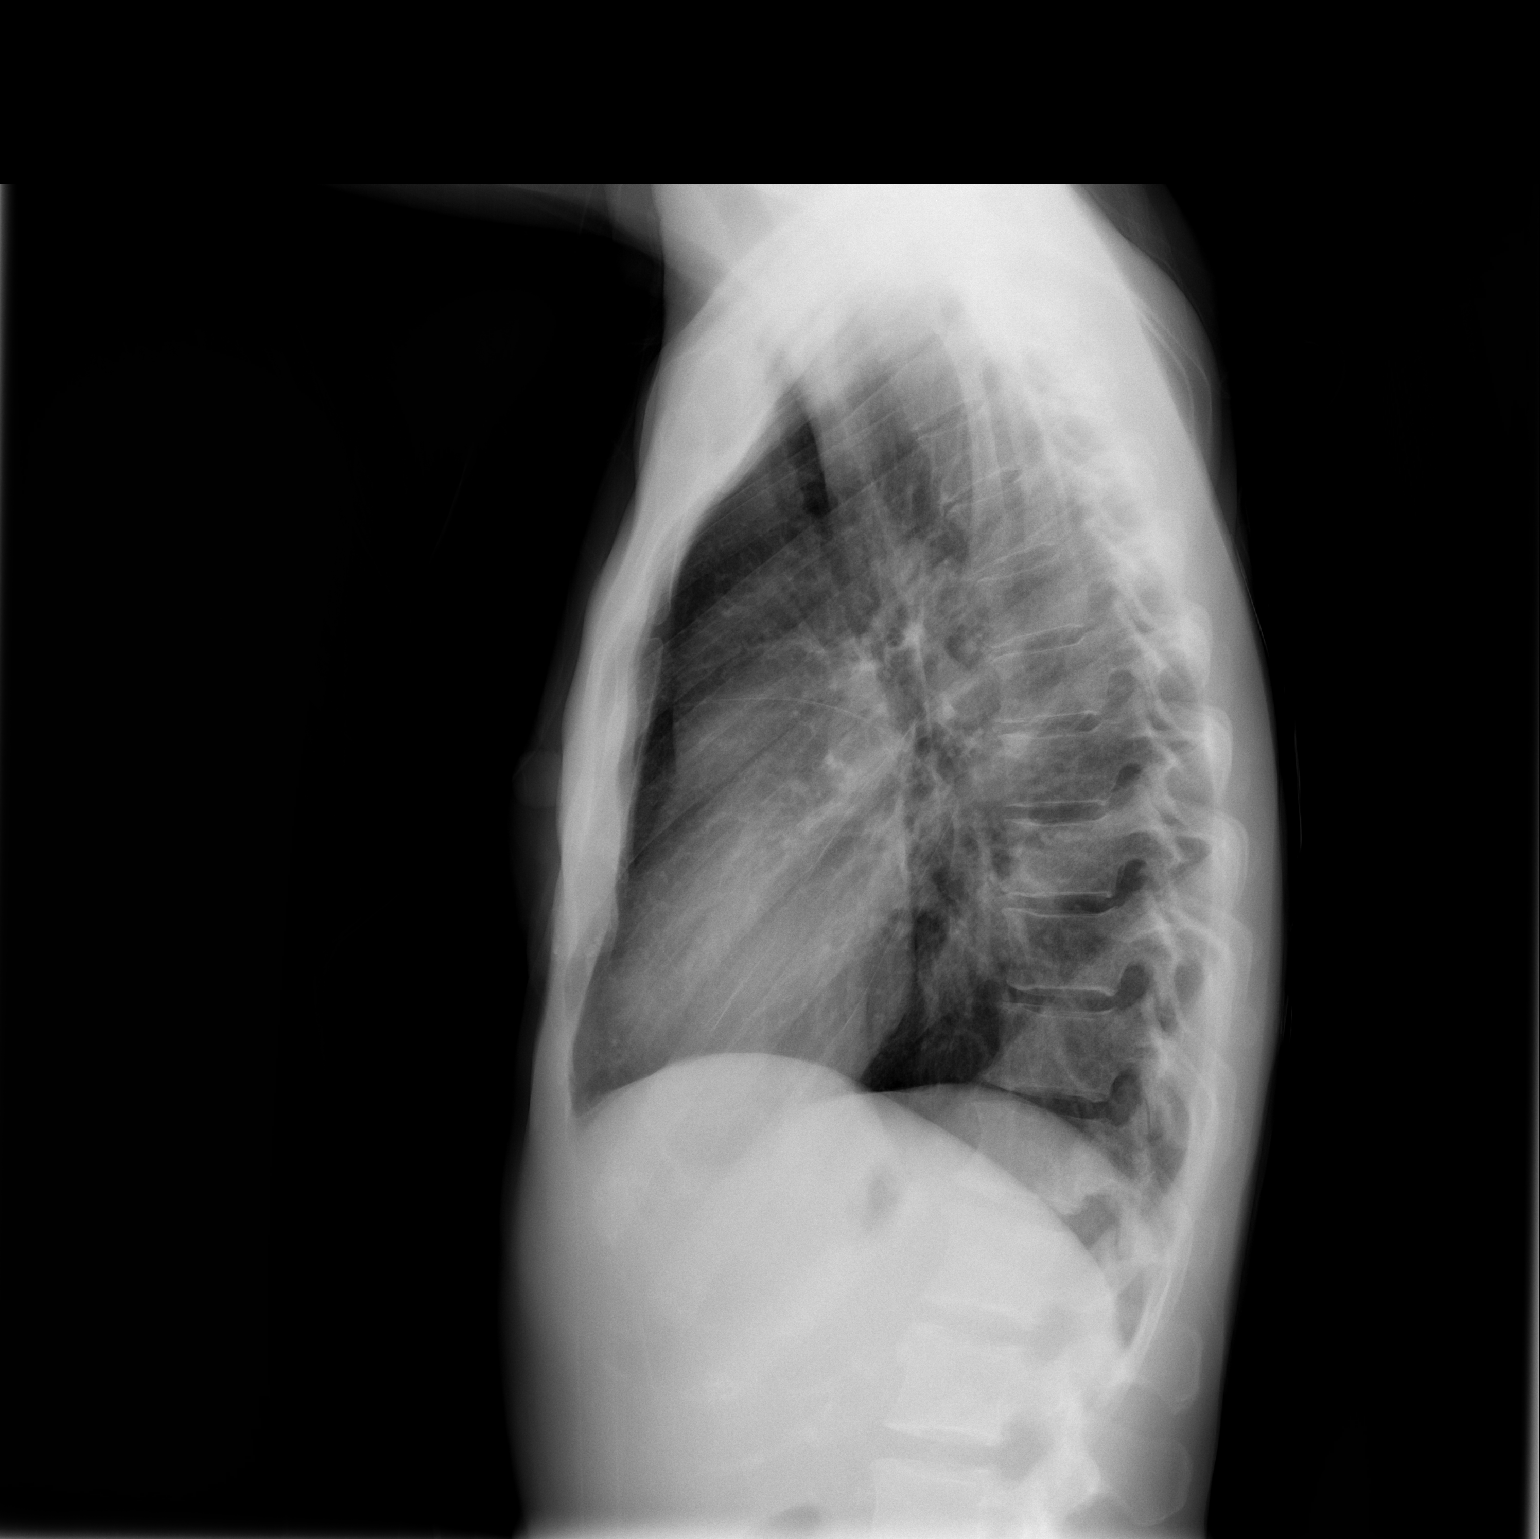

[2 of 2 positions shown; findings below may reference images not displayed]

FINDINGS: Two views of the chest demonstrate clear lungs.  There is
a nodular density overlying the left lower chest that probably
represents a nipple shadow based on the lateral view.  The heart
and mediastinum are within normal limits.  The trachea is midline.
Bony structures are intact
IMPRESSION: No acute chest findings.

## 2011-04-25 IMAGING — CR DG CHEST 2V
1 series · 1 of 1 positions shown · non-contrast
Comparison: 05/07/2009

CLINICAL DATA: Shortness of breath, chest pain

CHEST - 2 VIEW

[view not recorded]
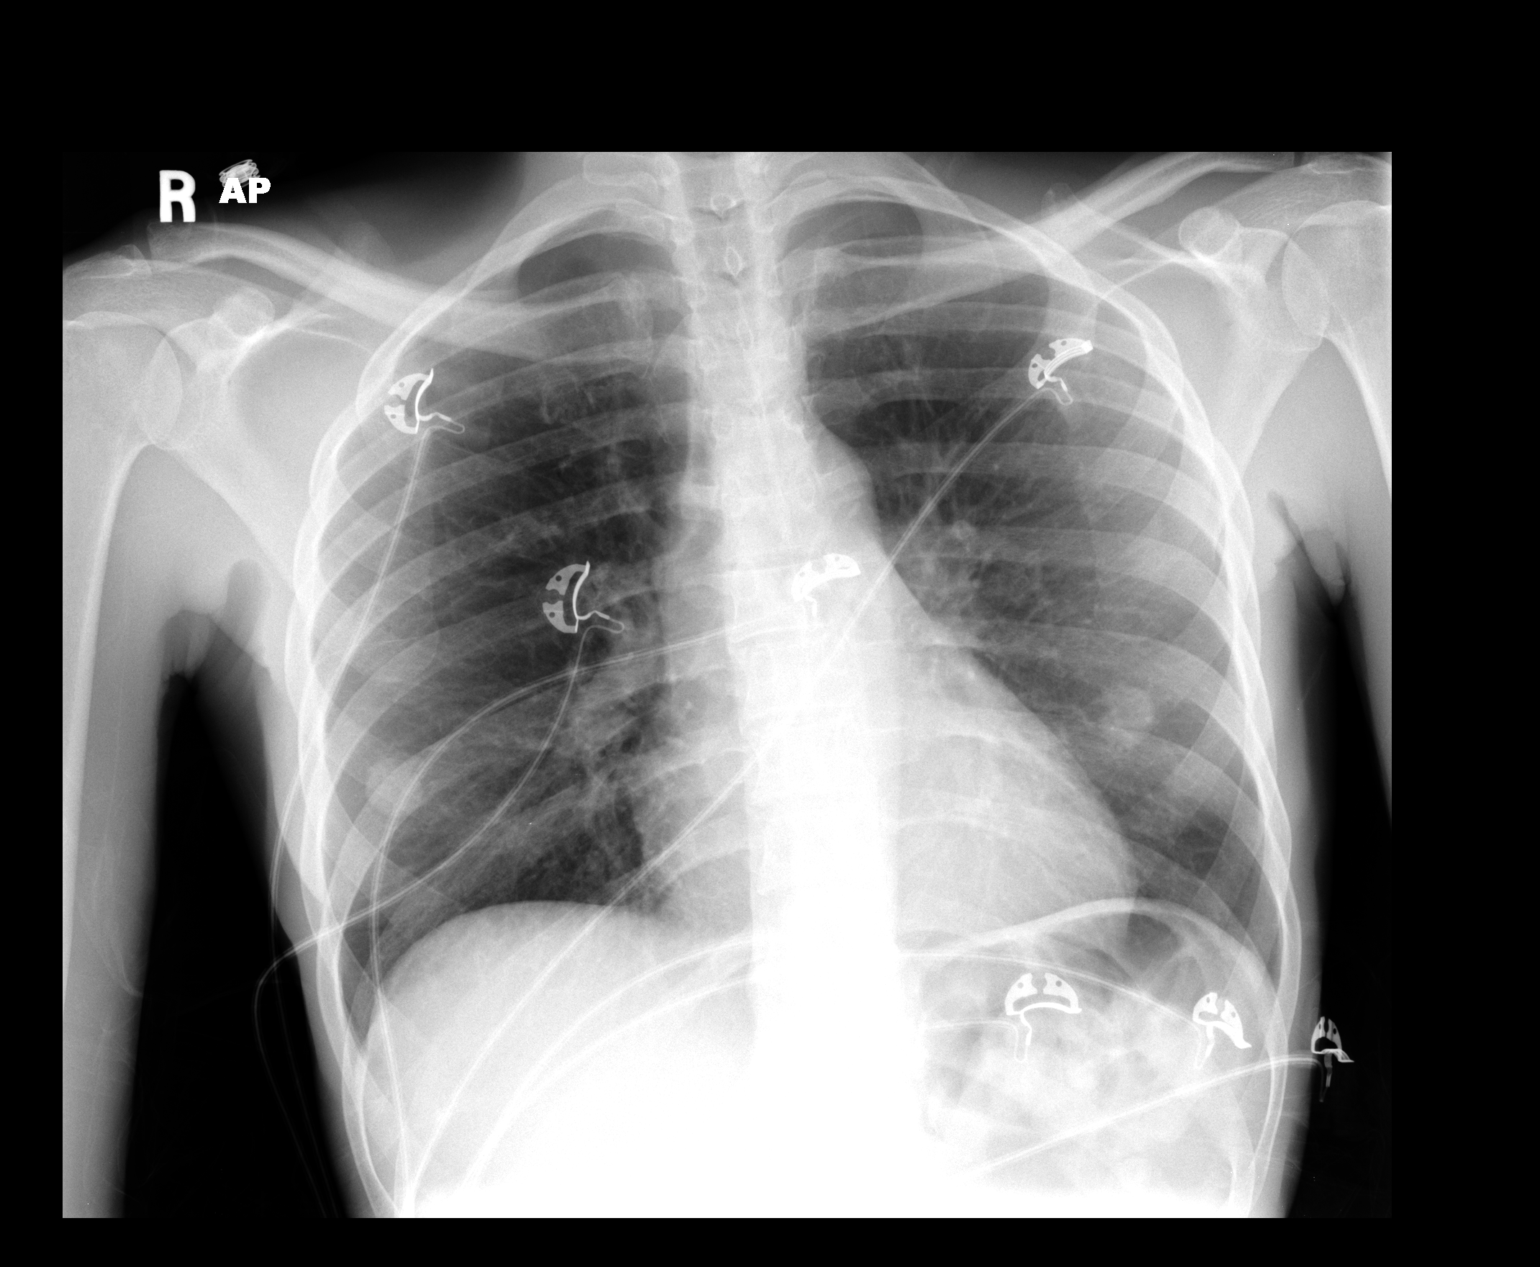

[1 of 1 positions shown; findings below may reference images not displayed]

FINDINGS: The heart size and mediastinal contours are within
normal limits.  Both lungs are clear.  The visualized skeletal
structures are unremarkable. Bilateral nipple shadows noted.
IMPRESSION: No active cardiopulmonary disease.

## 2011-05-14 IMAGING — CR DG CHEST 2V
2 series · 2 of 2 positions shown · non-contrast
Comparison: 05/11/2009.

CLINICAL DATA: Chest pain and shortness of breath.

CHEST - 2 VIEW

[w chest pa]
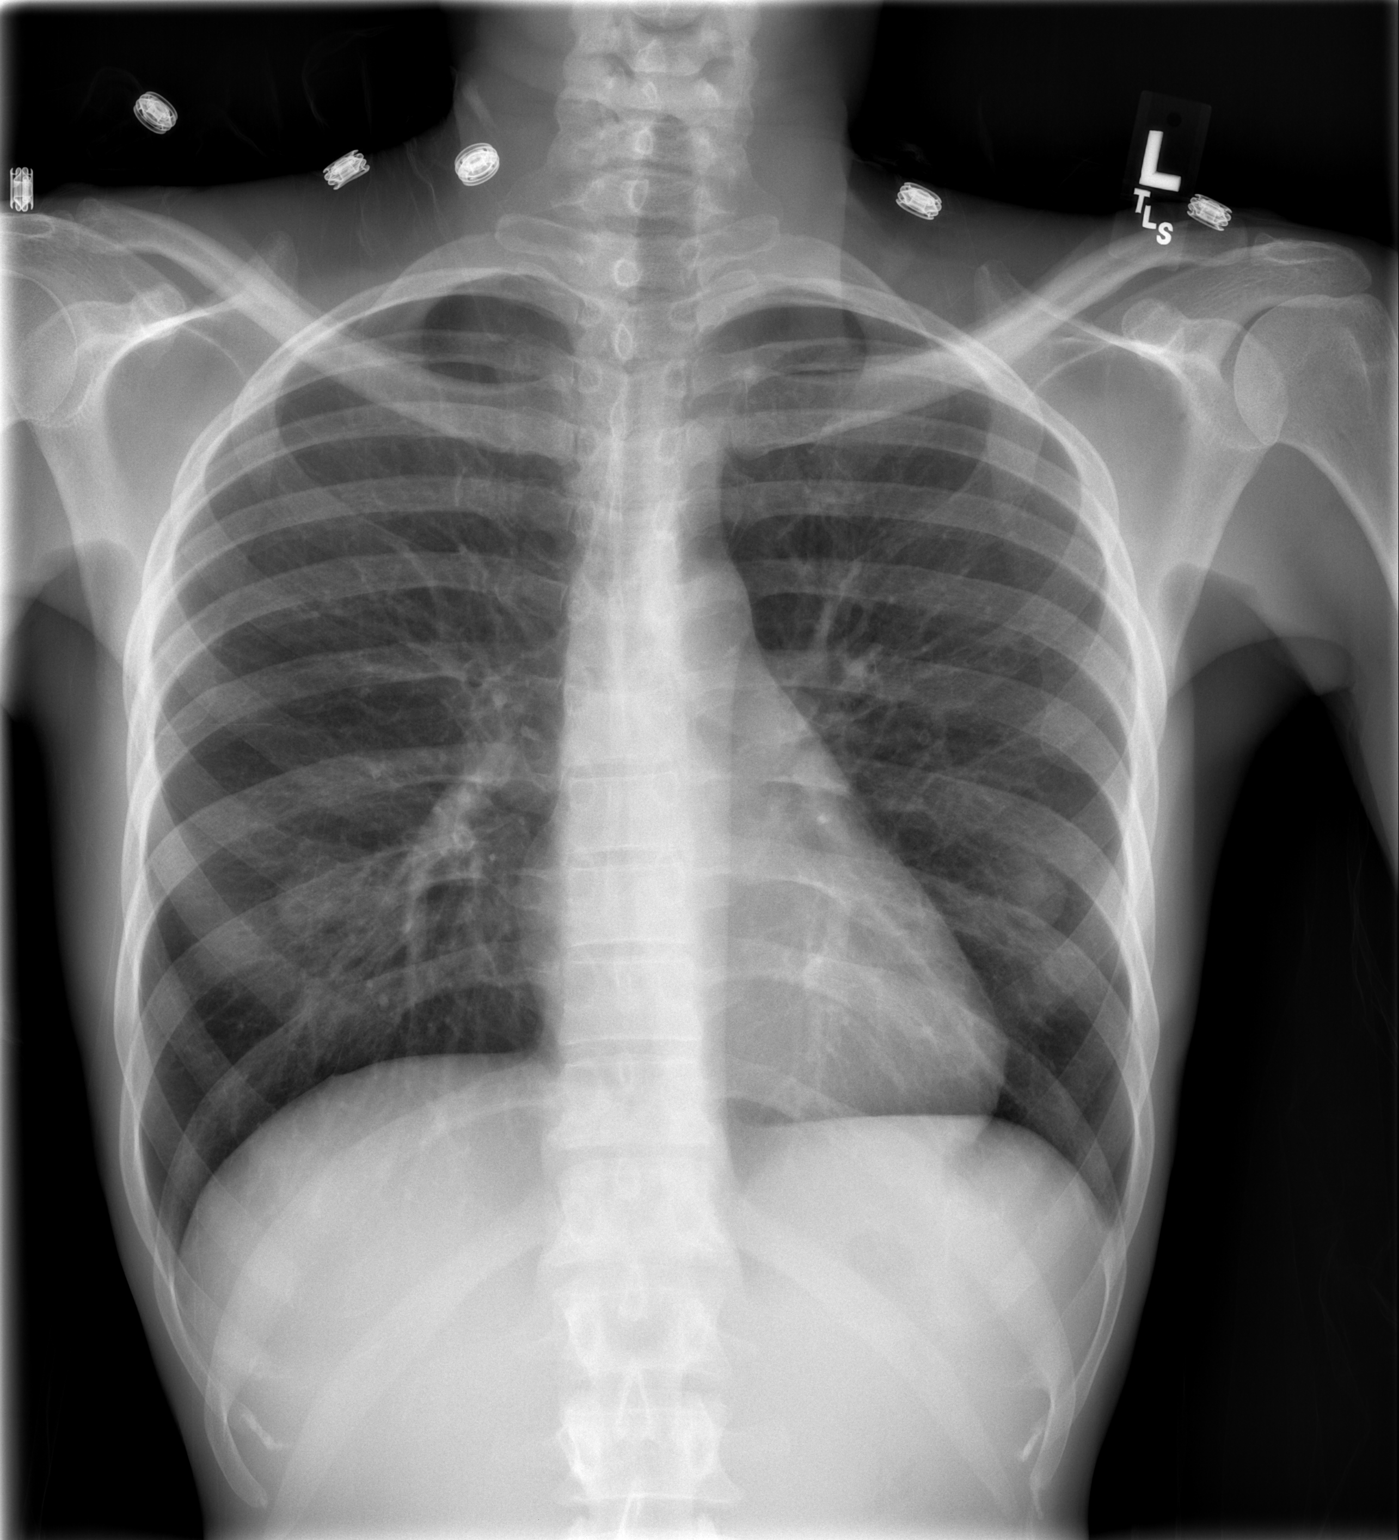

[w chest lat]
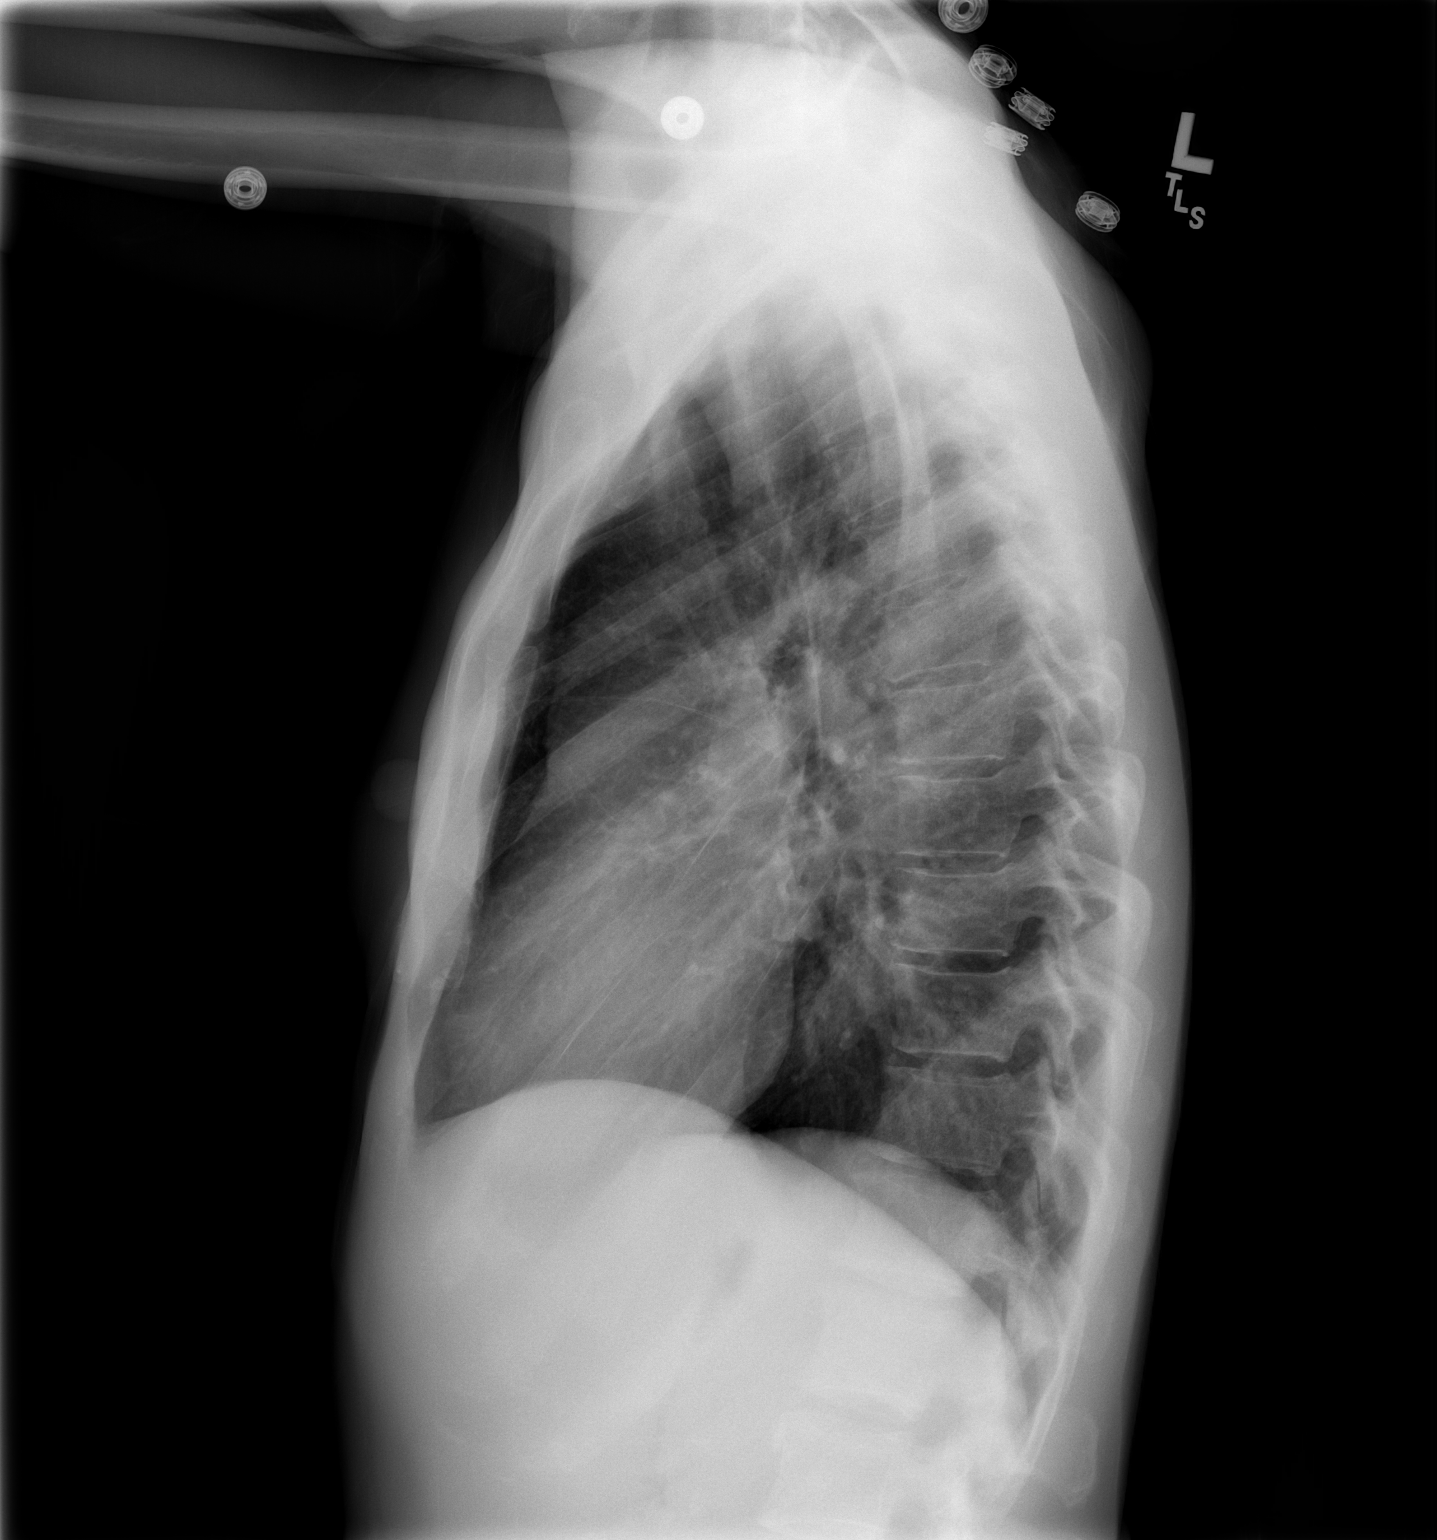

[2 of 2 positions shown; findings below may reference images not displayed]

FINDINGS: The heart size and mediastinal contours are stable.  The
lungs are clear.  There is no pleural effusion.  Prominent nipple
shadows are again noted.
IMPRESSION: Stable examination.  No active cardiopulmonary process.

## 2011-05-15 IMAGING — CT CT ANGIO CHEST
2 of 11 series · 16 of 37 positions shown · IV contrast (APPLIED)
Comparison: Chest x-ray [DATE]

CLINICAL DATA: Left ear pain and swelling.  Fever, fatigue, cough.
Bilateral leg and arm pain.  Evaluate for pulmonary embolus.

CT ANGIOGRAPHY CHEST WITH CONTRAST
TECHNIQUE: Multidetector CT imaging of the chest was performed
using the standard protocol during bolus administration of
intravenous contrast. Multiplanar CT image reconstructions
including MIPs were obtained to evaluate the vascular anatomy.
Contrast: 100 ml Umnipaque-LHH

[Series 7: abd/pelv with 5.0 b31f st · axial · 0.61mm/px · z∈[-422,-212]mm · 3 of 85 slices shown]
[im 22/85  lung]
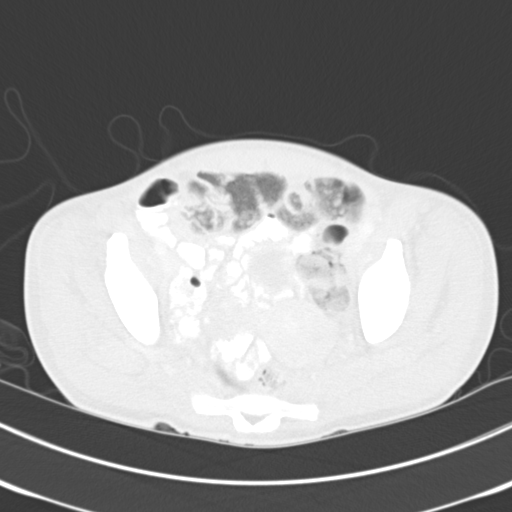
[im 43/85  lung]
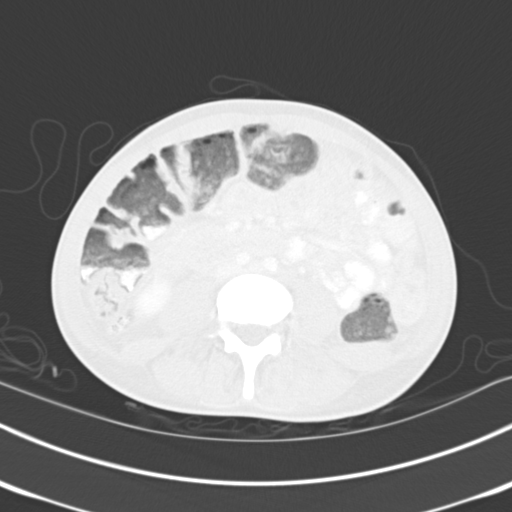
[im 64/85  lung]
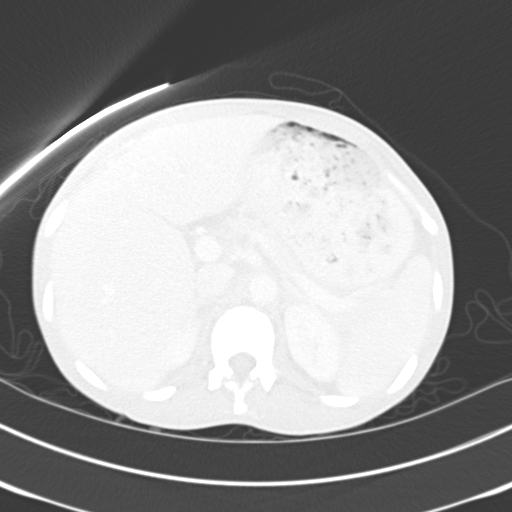

[Series 13: pulm embolism 1.0 b25f thins · axial · 0.59mm/px · z∈[-172,+30]mm · 13 of 235 slices shown]
[im 17/235  lung]
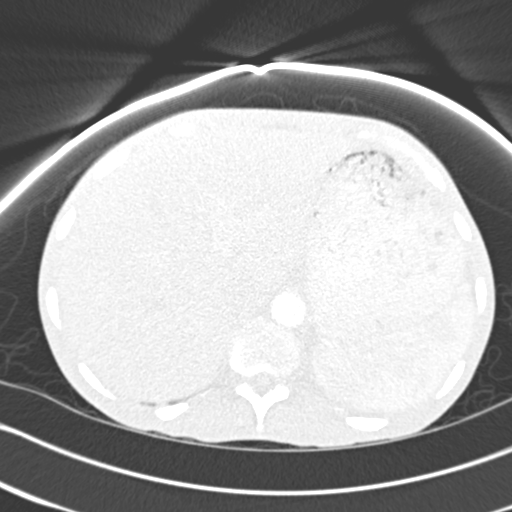
[im 34/235  mediastinal]
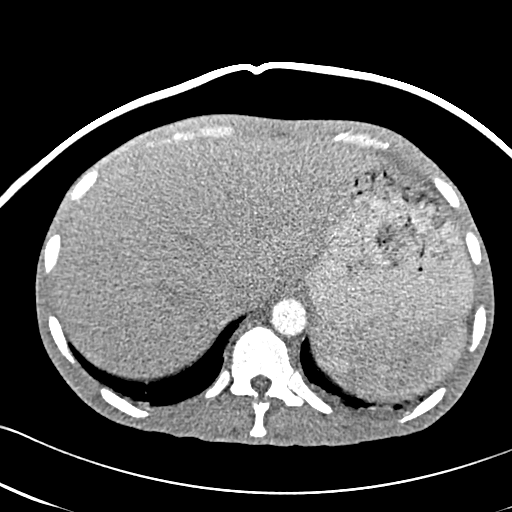
[im 51/235  lung]
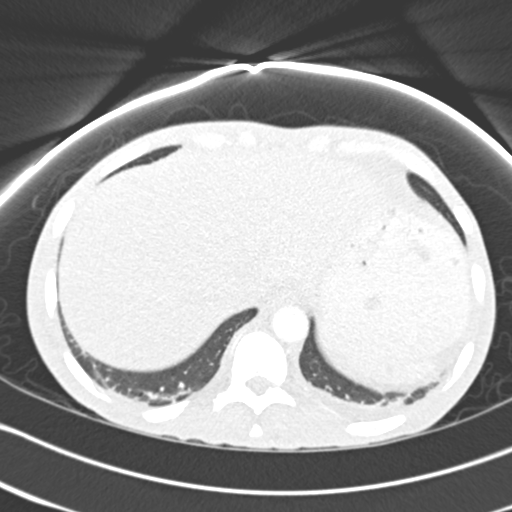
[im 67/235  mediastinal]
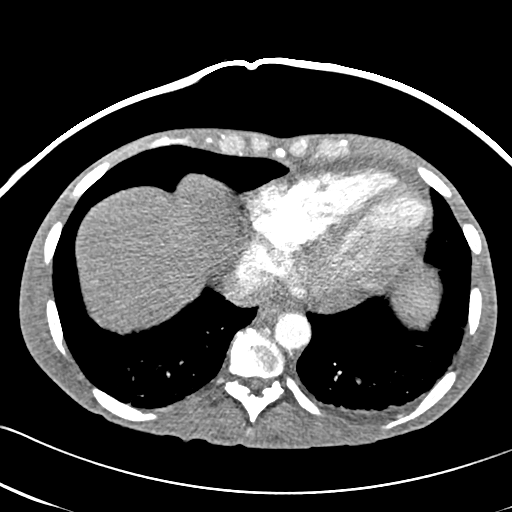
[im 84/235  lung]
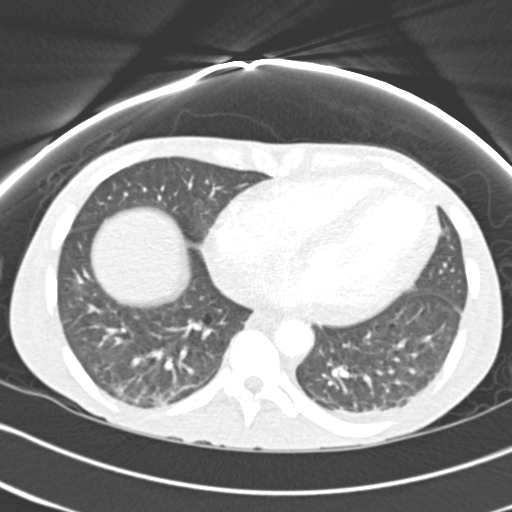
[im 101/235  mediastinal]
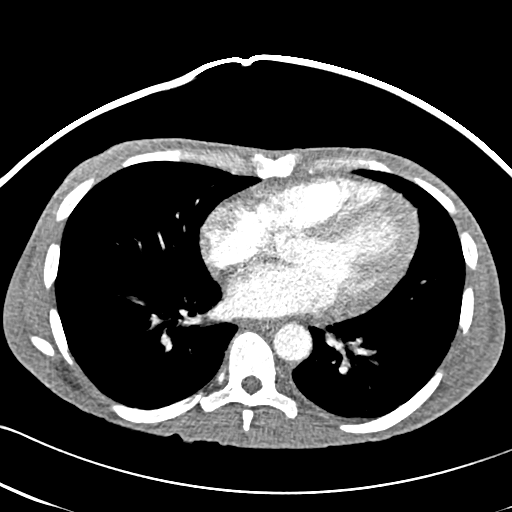
[im 118/235  lung]
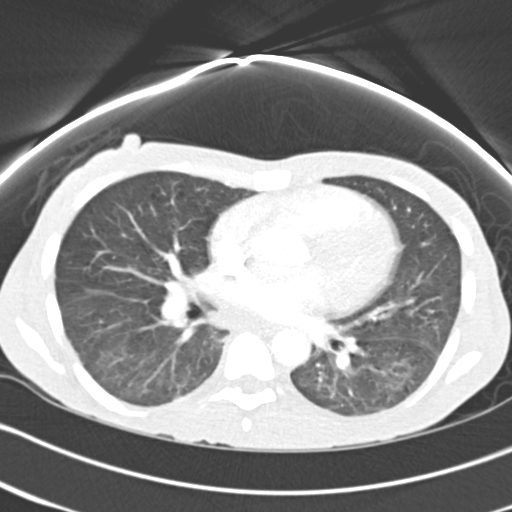
[im 134/235  mediastinal]
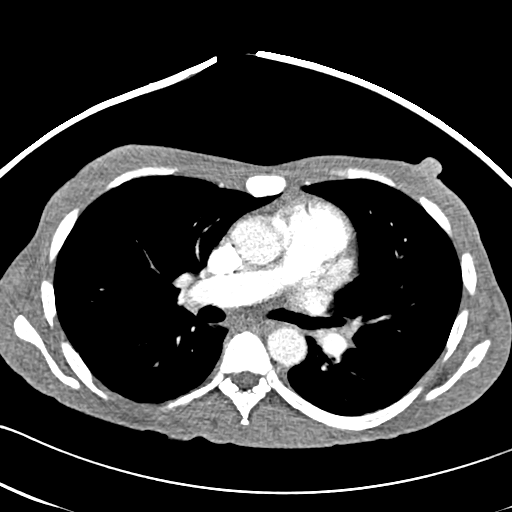
[im 151/235  lung]
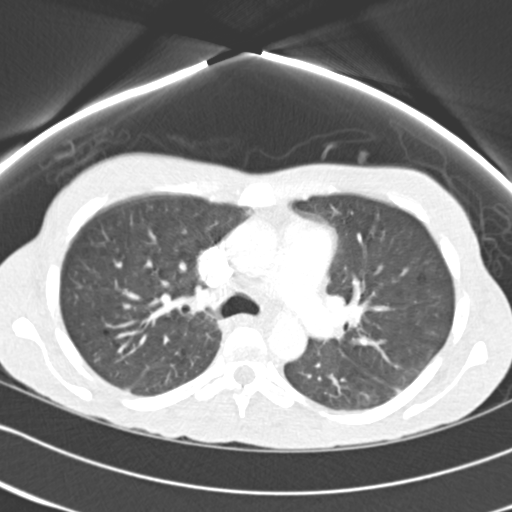
[im 168/235  mediastinal]
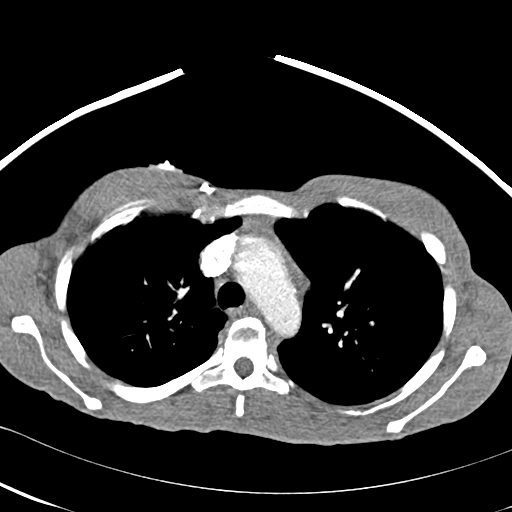
[im 184/235  lung]
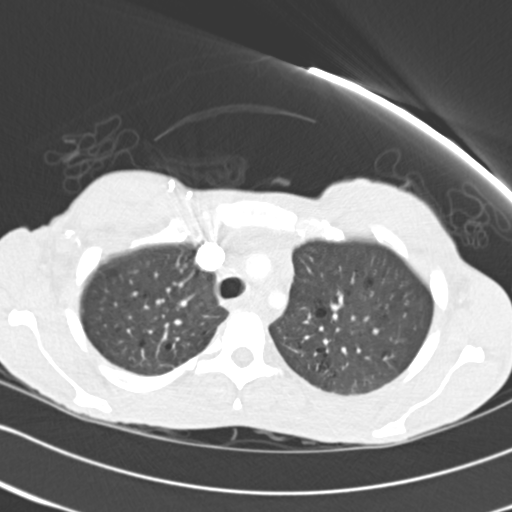
[im 201/235  mediastinal]
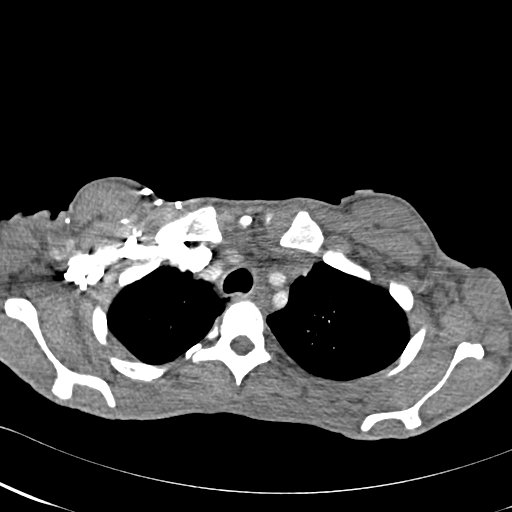
[im 218/235  lung]
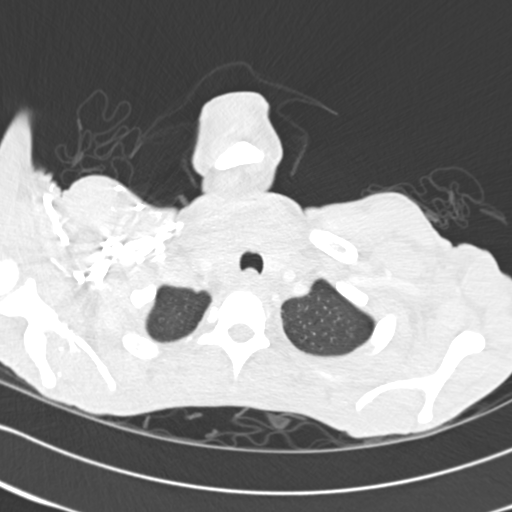

[16 of 37 positions shown; findings below may reference images not displayed]

FINDINGS: No pulmonary embolus.  Nonspecific haziness is seen in
the mediastinum.  There are small to borderline enlarged
mediastinal lymph nodes.  Bihilar lymphoid tissue.  Subcarinal
lymph node measures approximately 12 mm short axis.  Heart is
enlarged.  No pericardial effusion.  Note is made of collateral
venous flow in the right chest.

Tiny left pleural effusion.  Biapical pleural parenchymal scarring.
Lungs are emphysematous.  Respiratory motion degrades image
quality.  Minimal dependent subsegmental atelectasis bilaterally.
Airway is unremarkable.

Incidental imaging of the upper abdomen shows no acute findings.
No worrisome lytic or sclerotic lesions.

Review of the MIP images confirms the above findings.
IMPRESSION: 1.  No pulmonary embolus.
2.  Tiny left pleural effusion.
3.  Cachexia with nonspecific haziness and lymphoid tissue in the
mediastinum and both hilar regions.

## 2011-05-21 ENCOUNTER — Emergency Department (HOSPITAL_COMMUNITY)
Admission: EM | Admit: 2011-05-21 | Discharge: 2011-05-21 | Disposition: A | Discharge status: Home / Self Care | Payer: Medicaid Other | Attending: Emergency Medicine | Admitting: Emergency Medicine

## 2011-05-21 ENCOUNTER — Emergency Department (HOSPITAL_COMMUNITY): Payer: Medicaid Other

## 2011-05-21 DIAGNOSIS — F1410 Cocaine abuse, uncomplicated: Secondary | ICD-10-CM | POA: Insufficient documentation

## 2011-05-21 DIAGNOSIS — I10 Essential (primary) hypertension: Secondary | ICD-10-CM | POA: Insufficient documentation

## 2011-05-21 DIAGNOSIS — Z79899 Other long term (current) drug therapy: Secondary | ICD-10-CM | POA: Insufficient documentation

## 2011-05-21 DIAGNOSIS — F341 Dysthymic disorder: Secondary | ICD-10-CM | POA: Insufficient documentation

## 2011-05-21 DIAGNOSIS — R05 Cough: Secondary | ICD-10-CM | POA: Insufficient documentation

## 2011-05-21 DIAGNOSIS — R059 Cough, unspecified: Secondary | ICD-10-CM | POA: Insufficient documentation

## 2011-05-21 DIAGNOSIS — G8929 Other chronic pain: Secondary | ICD-10-CM | POA: Insufficient documentation

## 2011-05-21 DIAGNOSIS — R071 Chest pain on breathing: Secondary | ICD-10-CM | POA: Insufficient documentation

## 2011-05-21 DIAGNOSIS — R0602 Shortness of breath: Secondary | ICD-10-CM | POA: Insufficient documentation

## 2011-05-21 LAB — URINALYSIS, ROUTINE W REFLEX MICROSCOPIC
Glucose, UA: NEGATIVE mg/dL
pH: 6 (ref 5.0–8.0)

## 2011-05-21 LAB — DIFFERENTIAL
Basophils Absolute: 0 10*3/uL (ref 0.0–0.1)
Basophils Relative: 0 % (ref 0–1)
Eosinophils Relative: 1 % (ref 0–5)
Lymphocytes Relative: 26 % (ref 12–46)
Monocytes Absolute: 0 10*3/uL — ABNORMAL LOW (ref 0.1–1.0)
Neutro Abs: 2 10*3/uL (ref 1.7–7.7)

## 2011-05-21 LAB — CBC
HCT: 27.9 % — ABNORMAL LOW (ref 36.0–46.0)
Hemoglobin: 9.6 g/dL — ABNORMAL LOW (ref 12.0–15.0)
MCHC: 34.4 g/dL (ref 30.0–36.0)
RDW: 14.5 % (ref 11.5–15.5)
WBC: 2.7 10*3/uL — ABNORMAL LOW (ref 4.0–10.5)

## 2011-05-21 LAB — RAPID URINE DRUG SCREEN, HOSP PERFORMED
Cocaine: POSITIVE — AB
Opiates: NOT DETECTED
Tetrahydrocannabinol: NOT DETECTED

## 2011-05-21 LAB — URINE MICROSCOPIC-ADD ON

## 2011-05-21 LAB — COMPREHENSIVE METABOLIC PANEL
Albumin: 2.9 g/dL — ABNORMAL LOW (ref 3.5–5.2)
BUN: 13 mg/dL (ref 6–23)
Chloride: 104 mEq/L (ref 96–112)
Creatinine, Ser: 0.74 mg/dL (ref 0.50–1.10)
Total Bilirubin: 0.3 mg/dL (ref 0.3–1.2)
Total Protein: 6.2 g/dL (ref 6.0–8.3)

## 2011-05-23 IMAGING — CT CT NECK W/ CM
3 of 11 series · 12 of 33 positions shown, 13 images · IV contrast (80 ml omni 300)
Comparison: None

CLINICAL DATA: Polychondritis.

CT NECK WITH CONTRAST
TECHNIQUE: Multidetector CT imaging of the neck was performed with
intravenous contrast.
Contrast: 80 ml 5mnipaque-O44.

[Series 7: recon 2: 2cc/(id)/1cc/(id) · axial · 0.43mm/px · z∈[-91,+63]mm · 4 of 206 slices shown, 5 images]
[im 42/206  soft-tissue]
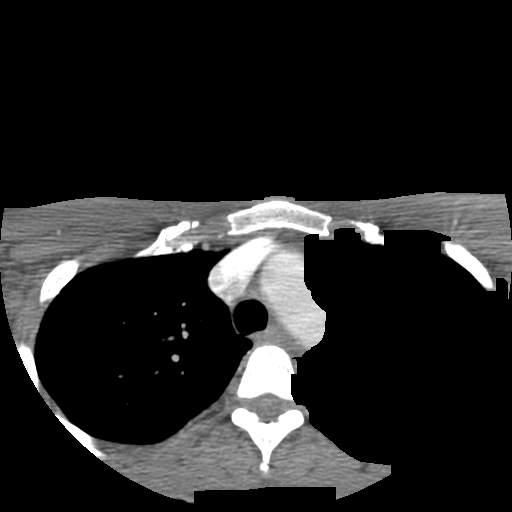
[im 42/206  bone]
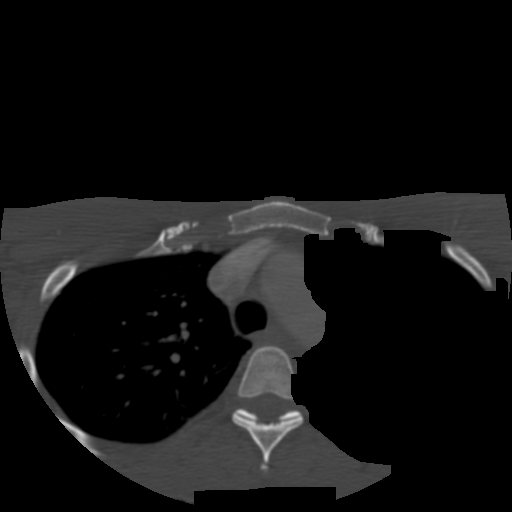
[im 83/206  bone]
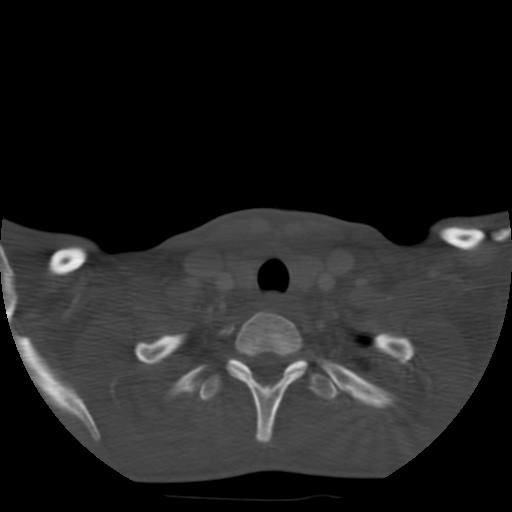
[im 124/206  bone]
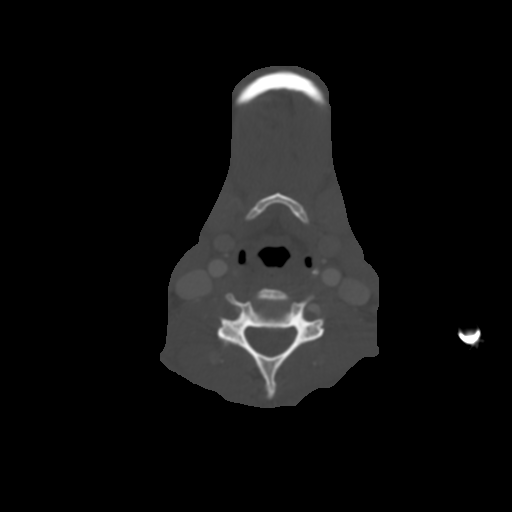
[im 165/206  bone]
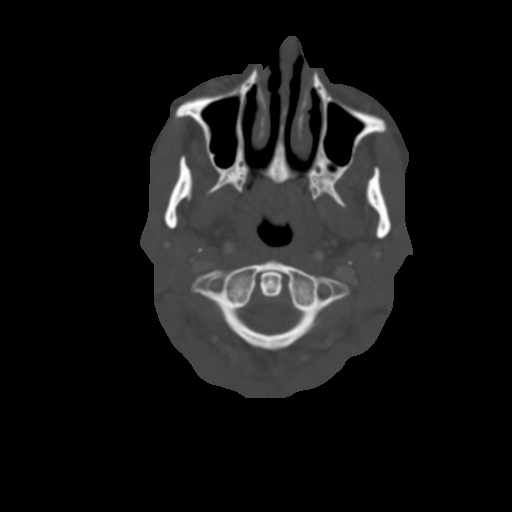

[Series 103: cor chest · coronal · 0.69mm/px · 3 of 69 slices shown]
[im 18/69  bone]
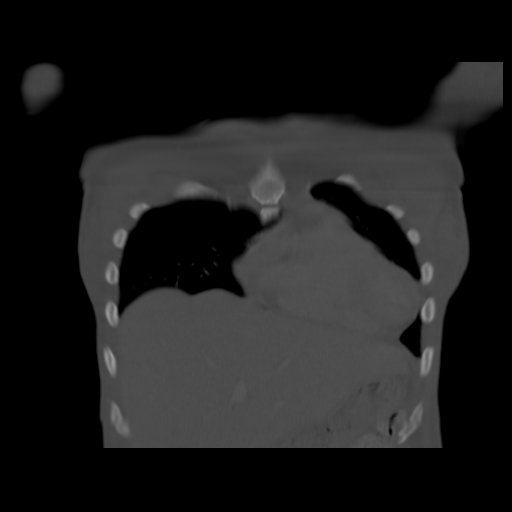
[im 35/69  bone]
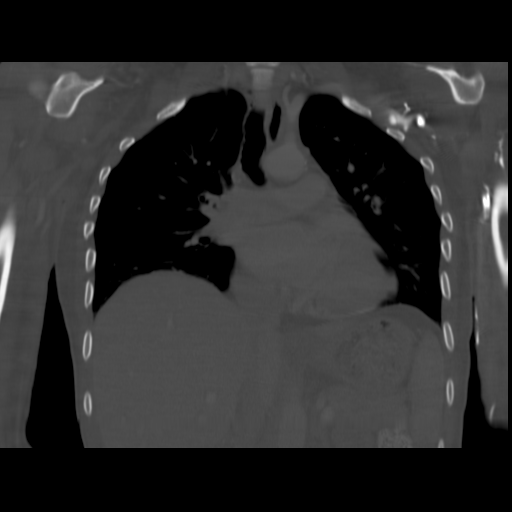
[im 52/69  bone]
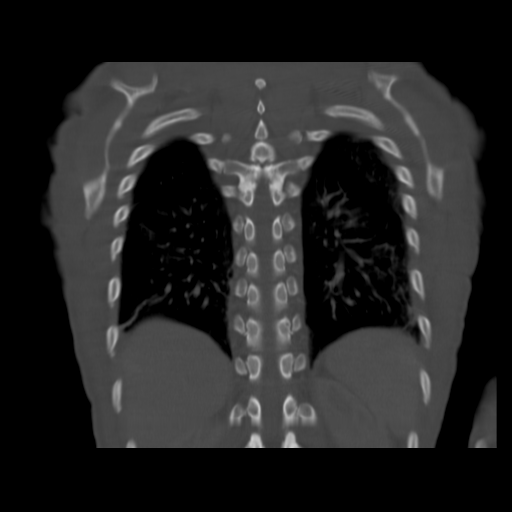

[Series 104: sag chest · sagittal · 0.69mm/px · 5 of 101 slices shown]
[im 15/101  bone]
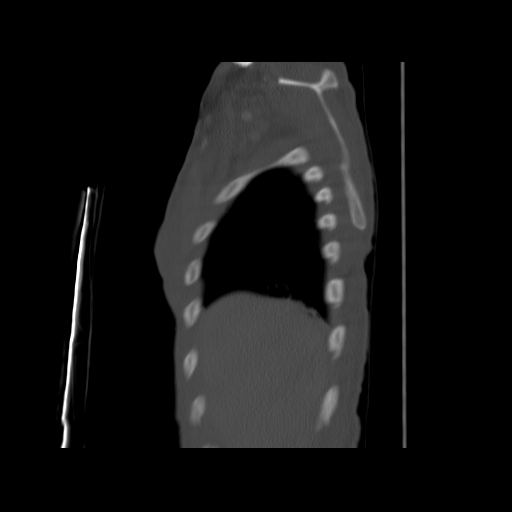
[im 29/101  bone]
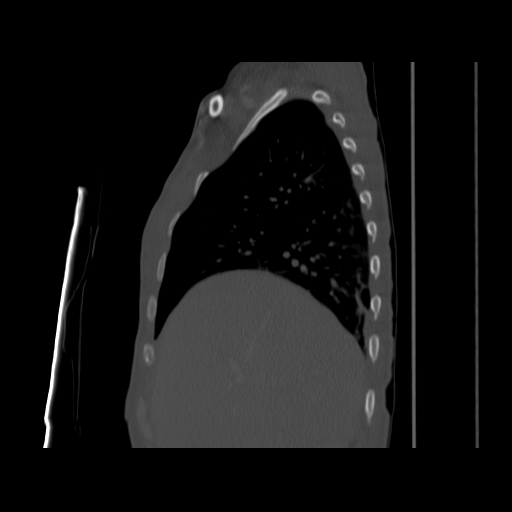
[im 43/101  bone]
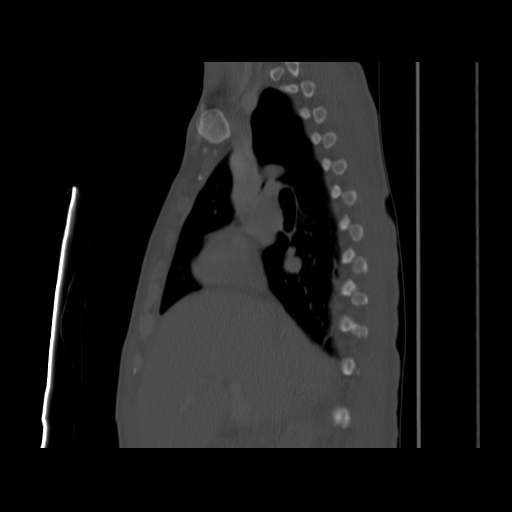
[im 58/101  bone]
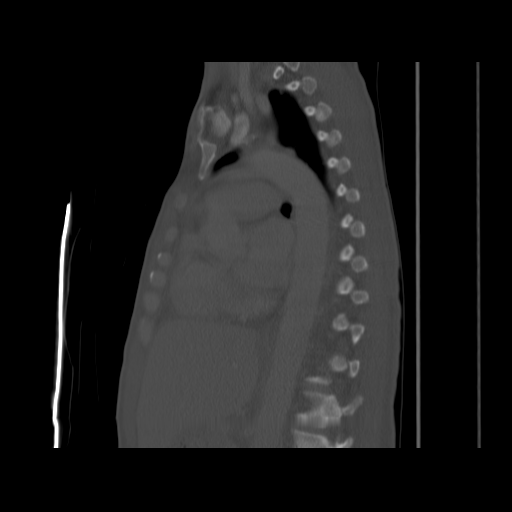
[im 72/101  bone]
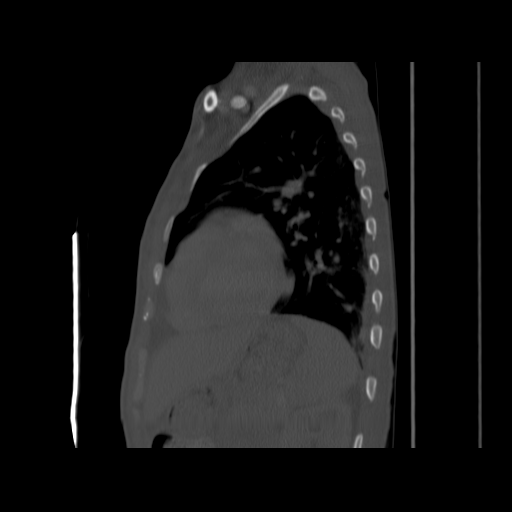

[12 of 33 positions shown; findings below may reference images not displayed]

FINDINGS: The visualized portion of the brain appears normal.  The
skull base is unremarkable.  The tongue base and floor the mouth
are unremarkable.  The parotid and submandibular glands are grossly
normal.  The epiglottis appears normal.  The periglottic fat planes
are maintained.  The vallecular airspace and piriform sinuses are
normal.

The major vascular structures appear normal.  There are small
scattered bilateral jugulodigastric and posterior cervical chain
lymph nodes but no adenopathy.

The thyroid gland appears normal.  Small supraclavicular lymph
nodes.

The lung apices demonstrate early COPD changes.  Small airspace
adjacent to the trachea is likely a small diverticulum.
IMPRESSION: Unremarkable CT examination of the neck.

## 2011-05-30 ENCOUNTER — Emergency Department (HOSPITAL_COMMUNITY)
Admission: EM | Admit: 2011-05-30 | Discharge: 2011-05-30 | Disposition: A | Discharge status: Home / Self Care | Payer: Medicaid Other | Attending: Emergency Medicine | Admitting: Emergency Medicine

## 2011-05-30 DIAGNOSIS — R64 Cachexia: Secondary | ICD-10-CM | POA: Insufficient documentation

## 2011-05-30 DIAGNOSIS — H9209 Otalgia, unspecified ear: Secondary | ICD-10-CM | POA: Insufficient documentation

## 2011-05-30 DIAGNOSIS — Z79899 Other long term (current) drug therapy: Secondary | ICD-10-CM | POA: Insufficient documentation

## 2011-05-30 DIAGNOSIS — I10 Essential (primary) hypertension: Secondary | ICD-10-CM | POA: Insufficient documentation

## 2011-05-30 DIAGNOSIS — F1410 Cocaine abuse, uncomplicated: Secondary | ICD-10-CM | POA: Insufficient documentation

## 2011-05-30 DIAGNOSIS — F341 Dysthymic disorder: Secondary | ICD-10-CM | POA: Insufficient documentation

## 2011-05-30 DIAGNOSIS — I776 Arteritis, unspecified: Secondary | ICD-10-CM | POA: Insufficient documentation

## 2011-05-30 LAB — RAPID STREP SCREEN (MED CTR MEBANE ONLY): Streptococcus, Group A Screen (Direct): NEGATIVE

## 2011-05-30 LAB — CBC
HCT: 29.9 % — ABNORMAL LOW (ref 36.0–46.0)
Hemoglobin: 10.5 g/dL — ABNORMAL LOW (ref 12.0–15.0)
MCH: 29.6 pg (ref 26.0–34.0)
MCV: 84.2 fL (ref 78.0–100.0)
Platelets: 227 10*3/uL (ref 150–400)
RBC: 3.55 MIL/uL — ABNORMAL LOW (ref 3.87–5.11)

## 2011-05-30 LAB — BASIC METABOLIC PANEL
BUN: 12 mg/dL (ref 6–23)
CO2: 28 mEq/L (ref 19–32)
Calcium: 9.2 mg/dL (ref 8.4–10.5)
Creatinine, Ser: 0.63 mg/dL (ref 0.50–1.10)
Glucose, Bld: 98 mg/dL (ref 70–99)

## 2011-05-30 LAB — DIFFERENTIAL
Eosinophils Absolute: 0 10*3/uL (ref 0.0–0.7)
Lymphs Abs: 0.9 10*3/uL (ref 0.7–4.0)
Monocytes Relative: 0 % — ABNORMAL LOW (ref 3–12)
Neutrophils Relative %: 80 % — ABNORMAL HIGH (ref 43–77)

## 2011-05-30 LAB — RAPID URINE DRUG SCREEN, HOSP PERFORMED
Barbiturates: NOT DETECTED
Tetrahydrocannabinol: POSITIVE — AB

## 2011-06-11 ENCOUNTER — Encounter: Payer: Self-pay | Admitting: Internal Medicine

## 2011-06-11 ENCOUNTER — Emergency Department (HOSPITAL_COMMUNITY): Payer: Medicaid Other

## 2011-06-11 ENCOUNTER — Emergency Department (HOSPITAL_COMMUNITY)
Admission: EM | Admit: 2011-06-11 | Discharge: 2011-06-12 | Disposition: A | Discharge status: Other Acute Inpatient Hospital | Payer: Medicaid Other | Source: Home / Self Care | Attending: Emergency Medicine | Admitting: Emergency Medicine

## 2011-06-11 DIAGNOSIS — I10 Essential (primary) hypertension: Secondary | ICD-10-CM | POA: Insufficient documentation

## 2011-06-11 DIAGNOSIS — R509 Fever, unspecified: Secondary | ICD-10-CM | POA: Insufficient documentation

## 2011-06-11 DIAGNOSIS — J189 Pneumonia, unspecified organism: Secondary | ICD-10-CM | POA: Insufficient documentation

## 2011-06-11 DIAGNOSIS — H9209 Otalgia, unspecified ear: Secondary | ICD-10-CM | POA: Insufficient documentation

## 2011-06-11 DIAGNOSIS — R079 Chest pain, unspecified: Secondary | ICD-10-CM | POA: Insufficient documentation

## 2011-06-11 LAB — DIFFERENTIAL
Basophils Absolute: 0 10*3/uL (ref 0.0–0.1)
Basophils Relative: 0 % (ref 0–1)
Neutro Abs: 7 10*3/uL (ref 1.7–7.7)
Neutrophils Relative %: 93 % — ABNORMAL HIGH (ref 43–77)

## 2011-06-11 LAB — CBC
Hemoglobin: 12.6 g/dL (ref 12.0–15.0)
MCHC: 33.8 g/dL (ref 30.0–36.0)
Platelets: 240 10*3/uL (ref 150–400)
RBC: 4.35 MIL/uL (ref 3.87–5.11)

## 2011-06-11 MED ORDER — IOHEXOL 300 MG/ML  SOLN
80.0000 mL | Freq: Once | INTRAMUSCULAR | Status: AC | PRN
  Administered 2011-06-11: 80 mL via INTRAVENOUS

## 2011-06-11 NOTE — H&P (Signed)
Hospital Admission Note Date: 06/11/2011  Patient name: [name] Medical record number: [id number] Date of birth: 12/19/1960 Age: 50 y.o. Gender: female PCP: Nicoletta Dress, NA, MD, MD  Medical Service:  General Medicine Teaching Service B1  Attending physician:  Dr. Oval Linsey   Resident [phone]):  Dr. Rosalia Hammers  Pager:   [phone] Resident (R1):  Dr. Ivor Costa   Pager:   [phone]  Chief Complaint: Cough  History of Present Illness: [name] is a 50 year old woman with a history of vasculitis associated with levimasole toxicity, crack cocaine use, tobacco use, HTN, and depression presenting with a two day history of cough productive of yellow sputum.  She has had chills and subjective fevers during the past two days as well.  She only has chest pain when coughing, and no shortness of breath.  Afebrile since arriving to the [facility] ED yesterday.  No nausea or vomiting.  No abdominal pain.  No constipation or diarrhea.  No headache.    She also has had ear pain and blackening for 1-2 weeks.  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  She also reports pain in both great toes under the nailbed for one day, with bruising under the nailbed R > L great toe.    She reports last using cocaine nine days ago.  She currently smoke about one cigarette per day.  She denies any other drug use.   Meds:  Current Outpatient Prescriptions  Medication Sig Dispense Refill  . dextromethorphan 15 MG/5ML syrup Take 10 mLs by mouth 4 (four) times daily as needed.        . diphenhydrAMINE (BENADRYL) 25 mg capsule Take 1 capsule (25 mg total) by mouth every 6 (six) hours as needed. itching  30 capsule  5  . FLUoxetine (PROZAC) 40 MG capsule Take 1 capsule (40 mg total) by mouth daily. For depression  30 capsule  5  . guaiFENesin (ROBITUSSIN) 100 MG/5ML liquid Take 100 mg by mouth every 6 (six) hours as needed. For cough       . ibuprofen  (ADVIL,MOTRIN) 400 MG tablet Take 400 mg by mouth every 6 (six) hours as needed. For pain       . lisinopril (PRINIVIL,ZESTRIL) 20 MG tablet Take 20 mg by mouth daily. For blood pressure       . traMADol (ULTRAM) 50 MG tablet Take 1 tablet (50 mg total) by mouth every 6 (six) hours as needed. For pain  30 tablet  1    Allergies: Acetaminophen  Past Medical History  Diagnosis Date  . Vasculitis     2/2 Levimasole toxicity associated with cocaine use.  Followed by Dr. Louanne Skye  . Hypertension   . Cocaine abuse   . Tobacco abuse   . Depression    Past Surgical History  Procedure Date  . Skin biopsy     bilateral shin nodules on 04/2010   Family History  Problem Relation Age of Onset  . Breast cancer Mother     Breast cancer  . Alcohol abuse Mother   . Cancer Mother     breast cancer  . Colon cancer Maternal Aunt   . Cancer Maternal Aunt     colon cancer  . Alcohol abuse Father    History   Social History  . Lives in a nursing facility.     Social History Main Topics  . Smoking status: Current Everyday Smoker --  0.1 packs/day    Types: Cigarettes      . Alcohol Use: No  . Drug Use: Yes    Special: "Crack" cocaine   Social History Narrative   Unemployed: Lives in nursing home.  Cleaning in past Living at [place]; has Medicaid.crack/cocaine use; pt denies IVDU. tobacco:  1/10 ppd.  Alcohol:  none     Review of Systems: See HPI  Physical Exam: T 97.5, R 115, BP 103/64, R 18, SiO2 100% RA  General: alert, well-developed, and cooperative to examination.  Anxious and tearful.  Head: normocephalic and atraumatic.  Bilateral ears show chronic scarring/tissue loss as well as active erythema and necrosis on edges of auricles diffusely.   Eyes: vision grossly intact, pupils equal, pupils round, pupils reactive to light, no injection and anicteric.  Neck: supple, full ROM, no JVD.  Lungs: normal respiratory effort, no accessory muscle use, normal breath sounds on  left, mildly diminished breath sounds on right, no crackles, and no wheezes.  Dullness to percussion over middle right lung field.    Heart: tachycardic, regular rhythm, no murmur, no gallop, and no rub.  Abdomen: soft, non-tender, normal bowel sounds, no distention, no guarding, no rebound tenderness. Msk: no joint swelling, no joint warmth, and no redness over joints.  Pulses: 2+ DP/PT pulses bilaterally Extremities: No cyanosis, clubbing, edema.  Nailbed bruising in bilateral great toes, R>L.  Very tender to palpation distal phalanx of both great toes.   Neurologic: alert & oriented X3, cranial nerves II-XII intact, moving all extremities, sensation intact to light touch. Skin: turgor normal.  Circular ~1 cm. hypopigmented scars on left arm and back of neck. Psych: Oriented X3, memory intact for recent and remote, anxious and tearful  Lab results:  CBC: Recent Labs  Basename 06/11/11 1726   WBC 7.6   NEUTROABS 7.0   HGB 12.6   HCT 37.3   MCV 85.7   PLT 240    Imaging results:  Dg Chest 2 View  06/11/2011  *RADIOLOGY REPORT*  Clinical Data: Left-sided chest pain.  Hypertension.  CHEST - 2 VIEW  Comparison: 05/21/2011.  Findings: There is a new right upper lobe opacity, 42 x 44 mm cross- section, which is peripherally located and adjacent to the horizontal fissure.  This is accompanied by bilateral bronchitic change. There is a slight nodular density just inferior and medial to the dominant area of opacity.  Considerations would include pneumonia, lung mass, or pulmonary infarct.  CT chest could provide additional information.  There is no effusion or pneumothorax. Bones are unremarkable.  Bilateral nipple shadows.  IMPRESSION: Considerable worsening aeration from priors. Generalized bronchitic type interstitial lung changes.  New right upper lobe process of uncertain etiology.  Consider CT chest for further evaluation.  Original Report Authenticated By: Staci Righter, M.D.   Ct Angio  Chest W/cm &/or Wo Cm  06/11/2011  *RADIOLOGY REPORT*  Clinical Data:  Chest pain.  History of vasculitis.  History of cocaine use.  CT ANGIOGRAPHY CHEST WITH CONTRAST  Technique:  Multidetector CT imaging of the chest was performed using the standard protocol during bolus administration of intravenous contrast.  Multiplanar CT image reconstructions including MIPs were obtained to evaluate the vascular anatomy.  Contrast:  80 ml Omnipaque-300.  Comparison:  Chest x-ray earlier today.  Findings:  Dense consolidation right upper lobe with air bronchograms consistent with pneumonia. Infiltrates involving the superior segment right lower lobe and portions of the right middle lobe are also present.  No pulmonary emboli.  Normal heart size.  Right hilar adenopathy is likely reactive, with a 11 x 16 mm lymph node the superior right hilum.  No mediastinal adenopathy or left hilar adenopathy.  Normal osseous structures.  Upper abdominal organs unremarkable.  No pleural effusion or pneumothorax.  Mild atheromatous change transverse arch.  Review of the MIP images confirms the above findings.  IMPRESSION: No pulmonary emboli.  Findings consistent with right lung pneumonia.  There is dense consolidation in the right upper lobe.  Follow-up chest x-rays until clear are necessary to exclude an underlying obstructing lesion.  Original Report Authenticated By: Staci Righter, M.D.    Assessment & Plan by Problem: Ms. Brunke is a 50 year old woman with a history of vasculitis associated with levimasole toxicity, crack cocaine use, tobacco use, HTN, and depression presenting with a two day history of cough productive of yellow sputum.   Productive Cough: History and imaging findings are concerning for pneumonia.  She has been hospitalized within the past 90 days (end of July), and she has been living in a healthcare facility, so this would be healthcare acquired pneumonia.  Pulmonary embolism ruled out on CTA.  Less likely on the  differential is pulmonary manifestation of her vasculitis.  Admit to telemetry Sputum Cx / Gram Stain Blood Cultures x 2 before starting antibiotics  Vancomycin and Zosyn for hospital acquired PNA Dextromethorphan 44m PO Q4H PRN cough CMET, CBC this morning   Sinus Tachycardia:  [facility]  BP also on the lower side on arrival.  May be related to volume depletion.  Does not meet criteria for sepsis.  Last echo in September 2011 showed good EF of 65-70%.  IVF: 1L NS bolus then NS @ 150 cc/hr Hold lisinopril  Vasculitis: Most important in controlling this will be discontinuing cocaine use, as her flares have correlated with relapsing into cocaine use in the past.  Tramadol 519mPO Q6HPRN for pain Ibuprofen 60059mO Q6HPRN for pain  History of Polysubstance Abuse: Cocaine, tobacco, and marijuana.  Urine Drug Screen Nicotine Patch Consider Tobacco Cessation Consult Continue Benadryl for itching  Depression: Continue home Prozac  DVT Prophylaxis: Lovenox      R2/3______________________________      R1________________________________  ATTENDING: I performed and/or observed a history and physical examination of the patient.  I discussed the case with the residents as noted and reviewed the residents' notes.  I agree with the findings and plan--please refer to the attending physician note for more details.  Signature________________________________  Printed Name_____________________________

## 2011-06-12 ENCOUNTER — Inpatient Hospital Stay (HOSPITAL_COMMUNITY)
Admission: AD | Admit: 2011-06-12 | Discharge: 2011-06-16 | DRG: 195 | Disposition: A | Discharge status: Nursing Home (Includes ICF) | Payer: Medicaid Other | Source: Other Acute Inpatient Hospital | Attending: Internal Medicine | Admitting: Internal Medicine

## 2011-06-12 DIAGNOSIS — J189 Pneumonia, unspecified organism: Secondary | ICD-10-CM

## 2011-06-12 DIAGNOSIS — F329 Major depressive disorder, single episode, unspecified: Secondary | ICD-10-CM | POA: Diagnosis present

## 2011-06-12 DIAGNOSIS — IMO0002 Reserved for concepts with insufficient information to code with codable children: Secondary | ICD-10-CM

## 2011-06-12 DIAGNOSIS — F1410 Cocaine abuse, uncomplicated: Secondary | ICD-10-CM | POA: Diagnosis present

## 2011-06-12 DIAGNOSIS — Z79899 Other long term (current) drug therapy: Secondary | ICD-10-CM

## 2011-06-12 DIAGNOSIS — I498 Other specified cardiac arrhythmias: Secondary | ICD-10-CM | POA: Diagnosis present

## 2011-06-12 DIAGNOSIS — R030 Elevated blood-pressure reading, without diagnosis of hypertension: Secondary | ICD-10-CM | POA: Diagnosis present

## 2011-06-12 DIAGNOSIS — F17200 Nicotine dependence, unspecified, uncomplicated: Secondary | ICD-10-CM | POA: Diagnosis present

## 2011-06-12 DIAGNOSIS — F3289 Other specified depressive episodes: Secondary | ICD-10-CM | POA: Diagnosis present

## 2011-06-12 DIAGNOSIS — I776 Arteritis, unspecified: Secondary | ICD-10-CM | POA: Diagnosis present

## 2011-06-12 LAB — URINALYSIS, ROUTINE W REFLEX MICROSCOPIC
Bilirubin Urine: NEGATIVE
Glucose, UA: NEGATIVE mg/dL
Ketones, ur: NEGATIVE mg/dL
Leukocytes, UA: NEGATIVE
Protein, ur: 30 mg/dL — AB
pH: 6.5 (ref 5.0–8.0)

## 2011-06-12 LAB — COMPREHENSIVE METABOLIC PANEL
AST: 16 U/L (ref 0–37)
Alkaline Phosphatase: 95 U/L (ref 39–117)
CO2: 21 mEq/L (ref 19–32)
Chloride: 100 mEq/L (ref 96–112)
Creatinine, Ser: 0.91 mg/dL (ref 0.50–1.10)
GFR calc non Af Amer: >60 mL/min (ref >60)
Potassium: 3.9 mEq/L (ref 3.5–5.1)
Total Bilirubin: 0.4 mg/dL (ref 0.3–1.2)

## 2011-06-12 LAB — DRUGS OF ABUSE SCREEN W/O ALC, ROUTINE URINE
Amphetamine Screen, Ur: NEGATIVE
Barbiturate Quant, Ur: NEGATIVE
Creatinine,U: 96.5 mg/dL
Marijuana Metabolite: POSITIVE — AB
Methadone: NEGATIVE

## 2011-06-12 LAB — CBC
HCT: 29.8 % — ABNORMAL LOW (ref 36.0–46.0)
MCV: 83.9 fL (ref 78.0–100.0)
Platelets: 213 10*3/uL (ref 150–400)
RBC: 3.55 MIL/uL — ABNORMAL LOW (ref 3.87–5.11)
WBC: 7.5 10*3/uL (ref 4.0–10.5)

## 2011-06-12 LAB — URINE MICROSCOPIC-ADD ON

## 2011-06-12 LAB — CORTISOL: Cortisol, Plasma: 16.3 ug/dL

## 2011-06-13 DIAGNOSIS — J189 Pneumonia, unspecified organism: Secondary | ICD-10-CM

## 2011-06-13 LAB — CBC
MCH: 29.3 pg (ref 26.0–34.0)
MCHC: 34.9 g/dL (ref 30.0–36.0)
RDW: 13.6 % (ref 11.5–15.5)

## 2011-06-13 LAB — BASIC METABOLIC PANEL
Calcium: 8.4 mg/dL (ref 8.4–10.5)
GFR calc Af Amer: >60 mL/min (ref >60)
GFR calc non Af Amer: >60 mL/min (ref >60)
Potassium: 3.8 mEq/L (ref 3.5–5.1)
Sodium: 139 mEq/L (ref 135–145)

## 2011-06-13 LAB — EXPECTORATED SPUTUM ASSESSMENT W GRAM STAIN, RFLX TO RESP C

## 2011-06-15 LAB — URINE CULTURE
Colony Count: 6000
Culture  Setup Time: 201209071137
Special Requests: NEGATIVE

## 2011-06-15 LAB — BASIC METABOLIC PANEL
Calcium: 9.3 mg/dL (ref 8.4–10.5)
Glucose, Bld: 169 mg/dL — ABNORMAL HIGH (ref 70–99)
Potassium: 2.8 mEq/L — ABNORMAL LOW (ref 3.5–5.1)
Sodium: 138 mEq/L (ref 135–145)

## 2011-06-15 LAB — VANCOMYCIN, TROUGH: Vancomycin Tr: 5.7 ug/mL — ABNORMAL LOW (ref 10.0–20.0)

## 2011-06-16 DIAGNOSIS — J189 Pneumonia, unspecified organism: Secondary | ICD-10-CM

## 2011-06-16 LAB — CULTURE, RESPIRATORY W GRAM STAIN

## 2011-06-16 LAB — BASIC METABOLIC PANEL
Calcium: 9 mg/dL (ref 8.4–10.5)
GFR calc Af Amer: >60 mL/min (ref >60)
GFR calc non Af Amer: >60 mL/min (ref >60)
Potassium: 3.4 mEq/L — ABNORMAL LOW (ref 3.5–5.1)
Sodium: 142 mEq/L (ref 135–145)

## 2011-06-17 LAB — OPIATE, QUANTITATIVE, URINE
Codeine Urine: NEGATIVE NG/ML
Hydromorphone GC/MS Conf: NEGATIVE NG/ML
Morphine, Confirm: 3655 NG/ML — ABNORMAL HIGH
Oxymorphone: NEGATIVE NG/ML

## 2011-06-18 LAB — CULTURE, BLOOD (ROUTINE X 2)
Culture  Setup Time: 201209071100
Culture: NO GROWTH

## 2011-06-21 NOTE — Discharge Summary (Signed)
[name], [name]                  ACCOUNT NO.:  [id number]  MEDICAL RECORD NO.:  [id number]  LOCATION:  5509                         FACILITY:  [facility]  PHYSICIAN:  Cristina Linsey, MD     DATE OF BIRTH:  [birth year]  DATE OF ADMISSION:  06/12/2011 DATE OF DISCHARGE:  06/15/2011                              DISCHARGE SUMMARY   DISCHARGE DIAGNOSES: 1. Healthcare-associated pneumonia. 2. Substance abuse. 3. Vasculitis. 4. Elevated blood pressure. 5. Sinus tachycardia.  DISCHARGE MEDICATIONS: 1. Ciprofloxacin 500 mg one tablet twice daily. 2. Diphenhydramine (Benadryl) 25 mg q.6 h p.r.n. for itchy 3. Fluoxetine 40 mg p.o. daily. 4. Ibuprofen 600 mg p.o. q.6 h p.r.n. for pain 5. Multivitamin one tablet p.o. daily. 6. Nicotine patch one patch daily. 7. Prednisone 60 mg p.o. daily with meal. 8. Tramadol 50 mg p.o. q.6 h p.r.n.for pain 9. Zolpidem 10 mg at bedtime daily.  DISPOSITION AND FOLLOWUP:   Dr. Owens [name] the [facility] on June 25, 2011,  at 3:30.  If her vasculitis (manifested by toe ischemia/pain is  improved consider a slow taper of prednisone, especially if she has not restarted using cocaine.  She will also need to be scheduled for a repeat appointment in 4 weeks to assure here pneumonia has resolved  radiographically.  PROCEDURE PERFORMED:   CT angiogram of chest with contrast on June 11, 2011.  No pulmonary  emboli were found.  There was dense consolidation in the right upper  lobe consistent with a right lung pneumonia.  CONSULTATION:  None.  BRIEF HISTORY OF PRESENT ILLNESS:    [name] is a 50 year old woman with a past medical history of vasculitis  associated with levamisole toxicity, crack cocaine use, tobacco use,  hypertension and depression presenting with a 2-day history of productive  cough of yellow sputum.  She has had chills and subjective fevers during  the past two days as well.  She only has chest pain when she coughs and   denies shortness of breath.  She is afebrile with no nausea, vomiting,  headaches, abdominal pain, constipation or diarrhea.  She had severe  ear pain with necrosis for the last week likely from vasculitis associated  with levamisole toxicity.  She also reports pain in both great toes, with  bruising under the nail bed, right worse than left.  Her last use of  cocaine was 9 days ago per her report.  PHYSICAL EXAMINATION:    VITAL SIGNS:  Temperature 97.5, blood pressure 103/64, heart rate 115,  respirations 18, oxygen saturation 100% on room air. GENERAL:  In pain, very anxious and tearful. HEENT:  Pupils equal, round and reactive to the light.  Bilateral ears show chronic scarring and tissue loss as well as active erythema and black necrosis on the edges of auricles diffusely. NECK:  Supple.  Full range of movement.  No JVD. LUNGS:  Good air movement.  Normal breath sounds on the left.  Mildly diminished breath sounds on the right.  No crackles, wheezing, rhonchi or rubs.  Dullness to percussion over right mid lung field. HEART:  Tachycardia.  Regular rhythm and rate.  No gallops or murmurs. ABDOMEN:  Soft, nontender.  Normal bowel sounds.  No distention, guarding or  rebound tenderness. EXTREMITIES:  2+ DT/PT pulses bilaterally.  No cyanosis, clubbing, or edema.   Nail bed has purple color changes in bilateral great toes, right worse than left.  Very tender to palpation.  Early infarction in all distal toes. NEURO:  Alert, oriented to person, time, and place.  Cranial nerves II  through XII intact grossly.  Moving all extremities.  Sensation intact to  light touch. SKIN:  Normal turgor.  No rash.  LABORATORY DATA:   Sodium 131, potassium 3.9, chloride 100, bicarbonate 21, BUN 14,  creatinine 0.91, glucose 118.  WBC count 7.5, hemoglobin 10.3, hematocrit  29.8, platelet 230.  HOSPITAL COURSE: 1. Pneumonia.  History and imaging findings were consistent with     pneumonia.  She  has been hospitalized within the past 19 days,     which was at the end of July.  She has also been living in a      skilled nursing facility, hence our concern for a healthcare associated     pneumonia.  She was initially treated with IV vancomycin and Zosyn.       Her cough, fever, and chest pain resolved upon discharge.  She was      discharged on Ciprofloxacin to complete the antibiotic course.  2. Vasculitis associated with crack cocaine use.  She had severe pain     in her toes.  She was treated with p.o. prednisone 60 mg p.o.     daily.  At discharge, she was free of pain in her toes.  3. Elevated blood pressure.  Felt to be secondary to pain.  She was followed     clinically and we did not treat with hypertensives.  This can be followed     up in the outpatient clinic.  4. Sinus tachycardia.  Also likely secondary to the pain.  Resolved upon      discharge.  DISCHARGE VITALS:   Temperature 98.6, blood pressure 170/92, pulse 100, respirations 20, oxygen saturation 100% on room air.  DISCHARGE LABS:   Blood cultures negative.   Sputum culture shows few Staphylococcus aureus.   Urine drug screen positive for marijuana and opiates.   Sodium 139, potassium 3.8, chloride 108, bicarbonate 23, BUN 14, creatinine  0.66, glucose 136.   ______________________________ Cristina Costa, MD   ______________________________ Cristina Linsey, MD   NX/MEDQ  D:  06/15/2011  T:  06/15/2011  Job:  [id number]  cc:   Cristina Lange, MD [name], [facility].  Electronically Signed by Cristina Costa MD on 06/21/2011 10:23:27 AM Electronically Signed by [name]  on 06/21/2011 12:31:30 PM

## 2011-06-25 ENCOUNTER — Encounter: Payer: Medicaid Other | Admitting: Internal Medicine

## 2011-06-29 ENCOUNTER — Ambulatory Visit (INDEPENDENT_AMBULATORY_CARE_PROVIDER_SITE_OTHER): Payer: Medicaid Other | Admitting: Internal Medicine

## 2011-06-29 ENCOUNTER — Encounter: Payer: Self-pay | Admitting: Internal Medicine

## 2011-06-29 DIAGNOSIS — I10 Essential (primary) hypertension: Secondary | ICD-10-CM

## 2011-06-29 DIAGNOSIS — I776 Arteritis, unspecified: Secondary | ICD-10-CM

## 2011-06-29 DIAGNOSIS — N63 Unspecified lump in unspecified breast: Secondary | ICD-10-CM

## 2011-06-29 DIAGNOSIS — F1910 Other psychoactive substance abuse, uncomplicated: Secondary | ICD-10-CM

## 2011-06-29 DIAGNOSIS — J189 Pneumonia, unspecified organism: Secondary | ICD-10-CM

## 2011-06-29 DIAGNOSIS — F329 Major depressive disorder, single episode, unspecified: Secondary | ICD-10-CM

## 2011-06-29 DIAGNOSIS — Y95 Nosocomial condition: Secondary | ICD-10-CM | POA: Insufficient documentation

## 2011-06-29 DIAGNOSIS — N6320 Unspecified lump in the left breast, unspecified quadrant: Secondary | ICD-10-CM

## 2011-06-29 MED ORDER — TRIPLE ANTIBIOTIC 5-400-5000 EX OINT: TOPICAL_OINTMENT | Freq: Three times a day (TID) | CUTANEOUS | Status: DC

## 2011-06-29 MED ORDER — PREDNISONE 10 MG PO TABS: ORAL_TABLET | ORAL | Status: DC

## 2011-06-29 NOTE — Progress Notes (Signed)
Subjective:   [name]   DOB: 10/18/1960 50 y.o.   MRN: [id number]  HPI: [name] is a 50 y.o. woman who presents for hospital follow up after being discharged for HAP.  She reports feeling much better with only a mild cough and minimal yellow, non-bloody sputum production.  She denies fever and chills. Endorses nasal congestion & HA in the mornings that resolves with tylenol.  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  Her ears are not currently painful, but she does use ultram.  She sees a specialized doctor at [facility] for this as well.  She has also suffered from toe ischemia in the past, but currently denies pain of her toes.    She reports that she did not smoke crack for 21 days, but did smoke crack and marijuana yesterday.    Past Medical History  Diagnosis Date  . 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 Followed by Dr. Louanne Skye  . Hypertension   . Cocaine abuse   . Tobacco abuse   . Depression    Current Outpatient Prescriptions  Medication Sig Dispense Refill  . diphenhydrAMINE (BENADRYL) 25 mg capsule Take 1 capsule (25 mg total) by mouth every 6 (six) hours as needed. itching  30 capsule  5  . FLUoxetine (PROZAC) 40 MG capsule Take 1 capsule (40 mg total) by mouth daily. For depression  30 capsule  5  . ibuprofen (ADVIL,MOTRIN) 400 MG tablet Take 400 mg by mouth every 6 (six) hours as needed. For pain       . lisinopril (PRINIVIL,ZESTRIL) 20 MG tablet Take 20 mg by mouth daily. For blood pressure       . Multiple Vitamin (MULTIVITAMIN) capsule Take 1 capsule by mouth daily.        . predniSONE (DELTASONE) 10 MG tablet 50 mg (5 tabs) daily x 7 days. 40 mg (4 tabs) daily x 7 days. 30 mg (3 tabs) daily x 7 days. 20 mg (2 tabs) daily x 7 days. 10 mg (1 tab) daily x 7 days.  98 tablet  0  . traMADol (ULTRAM) 50 MG tablet Take 1 tablet (50 mg total) by mouth every 6  (six) hours as needed. For pain  30 tablet  1  . vitamin B-12 (CYANOCOBALAMIN) 1000 MCG tablet Take 1,000 mcg by mouth daily.        Marland Kitchen dextromethorphan 15 MG/5ML syrup Take 10 mLs by mouth 4 (four) times daily as needed.        Marland Kitchen guaiFENesin (ROBITUSSIN) 100 MG/5ML liquid Take 100 mg by mouth every 6 (six) hours as needed. For cough       . neomycin-bacitracin-polymyxin (NEOSPORIN) [phone] ointment Apply topically 3 (three) times daily.  28.3 g  0   Family History  Problem Relation Age of Onset  . Breast cancer Mother     Breast cancer  . Alcohol abuse Mother   . Cancer Mother     breast cancer  . Colon cancer Maternal Aunt   . Cancer Maternal Aunt     colon cancer  . Alcohol abuse Father    History   Social History  . Marital Status: Single    Spouse Name: N/A    Number of Children: N/A  . Years of Education: N/A   Social History Main Topics  . Smoking status: Current Everyday Smoker --  0.3 packs/day    Types: Cigarettes  . Smokeless tobacco: None  . Alcohol Use: No  . Drug Use: Yes    Special: "Crack" cocaine, Marijuana  . Sexually Active: Not Currently    Birth Control/ Protection: Condom   Social History Narrative   Unemployed:  [facility] [place]; has Medicaid.crack/cocaine use; pt denies IVDUtobacco:  1/2 ppd, trying to quitalcohol:  none    Review of Systems: Constitutional: Denies fever, chills, diaphoresis, appetite change and fatigue.  HEENT: Denies photophobia, eye pain, redness, hearing loss, ear pain, congestion, sore throat, rhinorrhea, sneezing, mouth sores, trouble swallowing, neck pain, neck stiffness and tinnitus.   Respiratory: Denies SOB, DOE, cough, chest tightness,  and wheezing.   Cardiovascular: Denies chest pain, palpitations and leg swelling.  Gastrointestinal: Denies nausea, vomiting, abdominal pain, diarrhea, constipation, blood in stool and abdominal distention.  Genitourinary: Denies dysuria, urgency, frequency,  hematuria, flank pain and difficulty urinating.  Musculoskeletal: Denies myalgias, back pain, joint swelling, arthralgias and gait problem.  Skin: Denies pallor, endorses rash on buttocks  Neurological: Denies dizziness, seizures, syncope, weakness, light-headedness, numbness and headaches.  Psychiatric/Behavioral: Denies suicidal ideation, mood changes, confusion, nervousness, sleep disturbance and agitation  Objective:  Physical Exam: Filed Vitals:   06/29/11 1416  BP: 118/65  Pulse: 79  Temp: 98.4 F (36.9 C)  TempSrc: Oral  Height: 5' 1.5" (1.562 m)  Weight: 112 lb 3.2 oz (50.894 kg)   Constitutional: Vital signs reviewed.  Patient is a thin woman in no acute distress and cooperative with exam.   Ear: b/l pinna covered in old, black necrotic tissue, no blood or purulent exudate Mouth: no erythema or exudates, MMM Eyes: PERRL, EOMI, conjunctivae normal, No scleral icterus.  Neck: Supple, Trachea midline normal ROM.  Cardiovascular: RRR, S1 normal, S2 normal, no MRG, pulses symmetric and intact bilaterally Pulmonary/Chest: CTAB, no wheezes, rales, or rhonchi Abdominal: Soft. Non-tender, non-distended, bowel sounds are normal, no masses, organomegaly, or guarding present.  GU: no CVA tenderness Musculoskeletal: No joint deformities, erythema, or stiffness, ROM full and no nontender Neurological: A&O x3, Strength is normal and symmetric bilaterally, cranial nerve II-XII are grossly intact, no focal motor deficit, sensory intact to light touch bilaterally.  Skin: dime sized lesions on bottom and b/l thighs that vary in color, some are raw and appear to have recently bled, others are healed and appear white, others are darker in color Psychiatric: Normal mood and affect. speech and behavior is normal. Judgment and thought content normal. Cognition and memory are normal.   Assessment & Plan:  Case and care discussed with Dr. Lynnae January.  Please see problem oriented charting for further  detail. Patient to return in about 3 months for BP check and to meet her PCP, Dr. Nicoletta Dress.

## 2011-06-29 NOTE — Patient Instructions (Signed)
-  Please return around October 8th for a repeat chest x-ray.  We will call you with the results of your chest x-ray.    -We are going to start a prednisone taper for you.  We will decrease your prednisone by 55m every week until you are down to zero.  Week 1: 50 mg per day x 7 days  Week 2: 40 mg per day x 7 days  Week 3: 30 mg per day x 7 days  Week 4: 20 mg per day x 7 days  Week 5: 10 mg per day x 7 days...then stop prednisone.   -[facility] [place]  -Use Neosporin as needed for your ear and bottom wounds.    -Please call the clinic at [phone] you have any worsening symptoms.    -You are doing great with your blood pressure! Keep up the good work!  -It is especially important because of your vasculitis to try to quit your cocaine use.

## 2011-06-29 NOTE — Assessment & Plan Note (Signed)
Lab Results  Component Value Date   NA 142 06/16/2011   K 3.4* 06/16/2011   CL 105 06/16/2011   CO2 29 06/16/2011   BUN 10 06/16/2011   CREATININE 0.51 06/16/2011   CREATININE 0.80 12/25/2010    BP Readings from Last 3 Encounters:  06/29/11 118/65  12/25/10 119/77  11/13/10 98/58    Assessment: Hypertension control:  controlled  Progress toward goals:  at goal Barriers to meeting goals:  no barriers identified  Plan: Hypertension treatment:  continue current medications (Lisinopril 22m daily)

## 2011-06-29 NOTE — Assessment & Plan Note (Addendum)
Patient's b/l ears (pinna) seem to have evidence of necrosis, but not active.  Patient notes toe pain is better, and toe does not appear necrotic.  Will initiate a slow prednisone taper.  She is currently on 43m daily.  Will decrease to zero by 140mweek, so course to be complete in about 5 weeks.    She is [place] specialist [place] 07[place]/11.  I asked that she request records to be forwarded here.    I also prescribed topical neosporin for lesions 2/2 vasculitis on her bottom and b/l legs.      Dr KaVictorio Palmiscussed Ms HaMavityith me and I have reviewed and agree with Dr KaGirtha Rmote and medical plan.

## 2011-06-29 NOTE — Assessment & Plan Note (Addendum)
Stable. Continue 36m prozac daily.

## 2011-06-29 NOTE — Assessment & Plan Note (Signed)
She reports no cocaine use for 21 days until yesterday.  Yesterday she used cocaine and marijuana, but is motivated to quit.  She does still smoke, but is not motivated towards tobacco cessation at this time.  She denies IV drug abuse.

## 2011-06-29 NOTE — Assessment & Plan Note (Signed)
Patient was discharged on 06/15/11.  She was discharged with ciprofloxacin 553m BID.  Discharge summary does not indicate length of course, but likely 2 weeks long.  She does not know if she is taking that specific medication, but reports that she is taking everything she was discharged with.  It is likely that she has completed her course by now.    Plan: She will need a CXR 4 weeks after hospitalization (around 07/13/11) to be sure PNA has resolved.

## 2011-07-02 ENCOUNTER — Encounter: Payer: Medicaid Other | Admitting: Internal Medicine

## 2011-07-27 ENCOUNTER — Emergency Department (HOSPITAL_COMMUNITY)
Admission: EM | Admit: 2011-07-27 | Discharge: 2011-07-28 | Disposition: A | Discharge status: Home / Self Care | Payer: Medicaid Other | Attending: Emergency Medicine | Admitting: Emergency Medicine

## 2011-07-27 DIAGNOSIS — F341 Dysthymic disorder: Secondary | ICD-10-CM | POA: Insufficient documentation

## 2011-07-27 DIAGNOSIS — R509 Fever, unspecified: Secondary | ICD-10-CM | POA: Insufficient documentation

## 2011-07-27 DIAGNOSIS — I10 Essential (primary) hypertension: Secondary | ICD-10-CM | POA: Insufficient documentation

## 2011-07-27 DIAGNOSIS — R64 Cachexia: Secondary | ICD-10-CM | POA: Insufficient documentation

## 2011-07-27 DIAGNOSIS — Z79899 Other long term (current) drug therapy: Secondary | ICD-10-CM | POA: Insufficient documentation

## 2011-07-27 DIAGNOSIS — R0989 Other specified symptoms and signs involving the circulatory and respiratory systems: Secondary | ICD-10-CM | POA: Insufficient documentation

## 2011-07-27 DIAGNOSIS — J3489 Other specified disorders of nose and nasal sinuses: Secondary | ICD-10-CM | POA: Insufficient documentation

## 2011-07-27 DIAGNOSIS — R0602 Shortness of breath: Secondary | ICD-10-CM | POA: Insufficient documentation

## 2011-07-27 DIAGNOSIS — J189 Pneumonia, unspecified organism: Secondary | ICD-10-CM | POA: Insufficient documentation

## 2011-07-28 ENCOUNTER — Emergency Department (HOSPITAL_COMMUNITY): Payer: Medicaid Other

## 2011-07-31 ENCOUNTER — Other Ambulatory Visit: Payer: Self-pay | Admitting: Internal Medicine

## 2011-07-31 DIAGNOSIS — Z1231 Encounter for screening mammogram for malignant neoplasm of breast: Secondary | ICD-10-CM

## 2011-09-14 ENCOUNTER — Ambulatory Visit (HOSPITAL_COMMUNITY)
Admission: RE | Admit: 2011-09-14 | Discharge: 2011-09-14 | Disposition: A | Discharge status: Home / Self Care | Payer: Medicaid Other | Source: Ambulatory Visit | Attending: Family Medicine | Admitting: Family Medicine

## 2011-09-14 DIAGNOSIS — Z1231 Encounter for screening mammogram for malignant neoplasm of breast: Secondary | ICD-10-CM | POA: Insufficient documentation

## 2011-09-16 ENCOUNTER — Other Ambulatory Visit: Payer: Self-pay | Admitting: Family Medicine

## 2011-09-16 DIAGNOSIS — R928 Other abnormal and inconclusive findings on diagnostic imaging of breast: Secondary | ICD-10-CM

## 2011-09-17 DIAGNOSIS — N6320 Unspecified lump in the left breast, unspecified quadrant: Secondary | ICD-10-CM | POA: Insufficient documentation

## 2011-09-17 NOTE — Assessment & Plan Note (Addendum)
Patient had Mammogram which was done on 09/14/11 and resulted on 09/16/11. Left breast mass was noted on the report. After discussing with Dr. Hilma Favors, I will contact the breast center for further diagnostic plan.  The Breast center has been contacted, and they will schedule appointment for patient to return for further scans. [facility] and [facility] has [facility] number. [facility] will transport her to her appointments.

## 2011-09-19 IMAGING — CR DG CHEST 1V PORT
1 series · 1 of 1 positions shown · non-contrast
Comparison: CT of 06/08/2009 and 05/31/2009.  Plain film of
05/30/2009.

CLINICAL DATA: Chest pain.  Shortness of breath.  Cough.

PORTABLE CHEST - 1 VIEW

[AP]
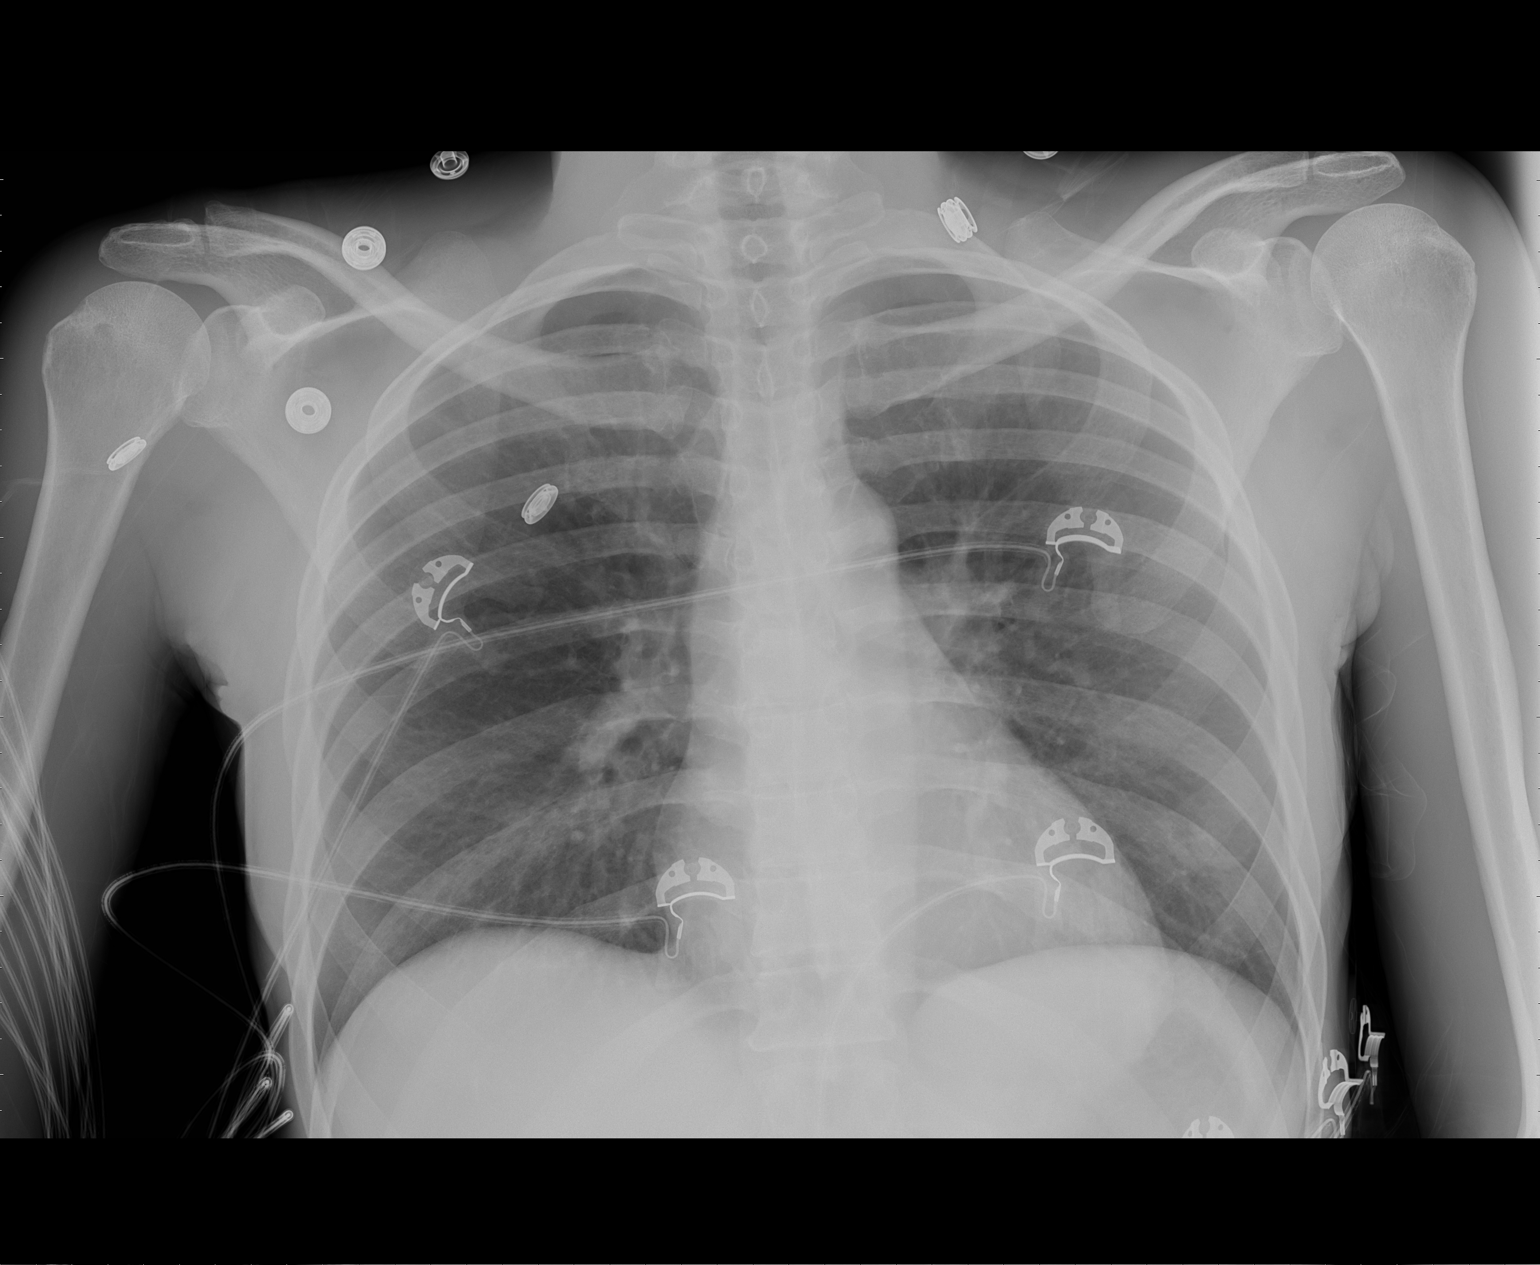

[1 of 1 positions shown; findings below may reference images not displayed]

FINDINGS: Nipple shadows project over the lower lobes bilaterally.
No lung nodules on recent chest CT. Midline trachea.  Normal heart
size and mediastinal contours. No pleural effusion or pneumothorax.
Mildly low lung volumes without focal opacity.
IMPRESSION: No acute cardiopulmonary disease.

## 2011-10-07 ENCOUNTER — Other Ambulatory Visit: Payer: Medicaid Other

## 2011-10-12 ENCOUNTER — Emergency Department (HOSPITAL_COMMUNITY): Payer: Medicaid Other

## 2011-10-12 ENCOUNTER — Encounter (HOSPITAL_COMMUNITY): Payer: Self-pay

## 2011-10-12 ENCOUNTER — Inpatient Hospital Stay (HOSPITAL_COMMUNITY)
Admission: EM | Admit: 2011-10-12 | Discharge: 2011-10-14 | DRG: 315 | Disposition: A | Discharge status: Home / Self Care | Payer: Medicaid Other | Attending: Internal Medicine | Admitting: Internal Medicine

## 2011-10-12 ENCOUNTER — Other Ambulatory Visit: Payer: Self-pay

## 2011-10-12 DIAGNOSIS — E876 Hypokalemia: Secondary | ICD-10-CM | POA: Diagnosis present

## 2011-10-12 DIAGNOSIS — I319 Disease of pericardium, unspecified: Principal | ICD-10-CM | POA: Diagnosis present

## 2011-10-12 DIAGNOSIS — L089 Local infection of the skin and subcutaneous tissue, unspecified: Secondary | ICD-10-CM | POA: Diagnosis present

## 2011-10-12 DIAGNOSIS — Z886 Allergy status to analgesic agent status: Secondary | ICD-10-CM

## 2011-10-12 DIAGNOSIS — R6510 Systemic inflammatory response syndrome (SIRS) of non-infectious origin without acute organ dysfunction: Secondary | ICD-10-CM | POA: Diagnosis present

## 2011-10-12 DIAGNOSIS — I10 Essential (primary) hypertension: Secondary | ICD-10-CM | POA: Diagnosis present

## 2011-10-12 DIAGNOSIS — F1410 Cocaine abuse, uncomplicated: Secondary | ICD-10-CM

## 2011-10-12 DIAGNOSIS — I309 Acute pericarditis, unspecified: Secondary | ICD-10-CM | POA: Diagnosis present

## 2011-10-12 DIAGNOSIS — I776 Arteritis, unspecified: Secondary | ICD-10-CM | POA: Diagnosis present

## 2011-10-12 DIAGNOSIS — F3289 Other specified depressive episodes: Secondary | ICD-10-CM | POA: Diagnosis present

## 2011-10-12 DIAGNOSIS — R079 Chest pain, unspecified: Secondary | ICD-10-CM | POA: Diagnosis present

## 2011-10-12 DIAGNOSIS — D649 Anemia, unspecified: Secondary | ICD-10-CM | POA: Diagnosis present

## 2011-10-12 DIAGNOSIS — F329 Major depressive disorder, single episode, unspecified: Secondary | ICD-10-CM

## 2011-10-12 DIAGNOSIS — M129 Arthropathy, unspecified: Secondary | ICD-10-CM | POA: Diagnosis present

## 2011-10-12 DIAGNOSIS — F17200 Nicotine dependence, unspecified, uncomplicated: Secondary | ICD-10-CM | POA: Diagnosis present

## 2011-10-12 HISTORY — DX: Rheumatoid arthritis, unspecified: M06.9

## 2011-10-12 HISTORY — DX: Anemia, unspecified: D64.9

## 2011-10-12 LAB — RAPID URINE DRUG SCREEN, HOSP PERFORMED
Amphetamines: NOT DETECTED
Barbiturates: NOT DETECTED
Opiates: NOT DETECTED
Tetrahydrocannabinol: POSITIVE — AB

## 2011-10-12 LAB — CBC
HCT: 38.5 % (ref 36.0–46.0)
Hemoglobin: 13 g/dL (ref 12.0–15.0)
MCH: 28.4 pg (ref 26.0–34.0)
MCHC: 33.8 g/dL (ref 30.0–36.0)
MCV: 84.2 fL (ref 78.0–100.0)
RBC: 4.57 MIL/uL (ref 3.87–5.11)

## 2011-10-12 LAB — BASIC METABOLIC PANEL
BUN: 9 mg/dL (ref 6–23)
CO2: 27 mEq/L (ref 19–32)
Calcium: 9 mg/dL (ref 8.4–10.5)
Glucose, Bld: 127 mg/dL — ABNORMAL HIGH (ref 70–99)
Sodium: 141 mEq/L (ref 135–145)

## 2011-10-12 LAB — POCT I-STAT TROPONIN I: Troponin i, poc: 0.01 ng/mL (ref 0.00–0.08)

## 2011-10-12 MED ORDER — SODIUM CHLORIDE 0.9 % IV BOLUS (SEPSIS)
1000.0000 mL | Freq: Once | INTRAVENOUS | Status: AC
  Administered 2011-10-12: 1000 mL via INTRAVENOUS

## 2011-10-12 MED ORDER — MORPHINE SULFATE 4 MG/ML IJ SOLN
4.0000 mg | Freq: Once | INTRAMUSCULAR | Status: AC
  Administered 2011-10-12: 4 mg via INTRAVENOUS
  Filled 2011-10-12: qty 1

## 2011-10-12 MED ORDER — IOHEXOL 300 MG/ML  SOLN
80.0000 mL | Freq: Once | INTRAMUSCULAR | Status: AC | PRN
  Administered 2011-10-12: 80 mL via INTRAVENOUS

## 2011-10-12 MED ORDER — IBUPROFEN 800 MG PO TABS
800.0000 mg | ORAL_TABLET | Freq: Once | ORAL | Status: AC
  Administered 2011-10-12: 800 mg via ORAL
  Filled 2011-10-12 (×2): qty 1

## 2011-10-12 MED ORDER — LORAZEPAM 2 MG/ML IJ SOLN
1.0000 mg | Freq: Once | INTRAMUSCULAR | Status: AC
  Administered 2011-10-12: 1 mg via INTRAVENOUS
  Filled 2011-10-12: qty 1

## 2011-10-12 NOTE — ED Notes (Signed)
Internal medicine at the bedside.

## 2011-10-12 NOTE — ED Notes (Signed)
The pt had  Morphine iv again.  Rt a-c iv removed not running.  Pt on bedpan x2 within 15 minutes

## 2011-10-12 NOTE — H&P (Signed)
Date: 10/12/2011                  Patient Name:  [name]  MRN: [id number]  DOB: 1961-03-11  Age / Sex: 51 y.o., female   PCP: Charlann Lange, MD, MD                 Medical Service: Internal Medicine Teaching Service                 Attending Physician: Dr. Milta Deiters     First Contact: Dr. Hans Eden  Pager: [phone]  Second Contact: Dr. Criss Alvine  Pager: [phone]              After Hours (After 5pm / weekends / holidays): First Contact  Pager: [phone]   Second Contact  Pager: [phone]     Chief Complaint: chest pain  History of Present Illness: Patient is a 51 y.o. 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 Onset was 1 day ago when she was laying down, with unchanged course since that time. The patient describes the pain as constant, excruciating in nature, does not radiate. Patient rates pain as a 10/10 in intensity.  Associated symptoms are diaphoresis, palpitations.. Aggravating factors are positional changes, deep inspiration.  Alleviating factors are: none.. Patient's cardiac risk factors are smoking/ tobacco exposure, hypertension.  Patient's risk factors for DVT/PE: none. Previous cardiac testing: echocardiogram. Denies nausea, vomiting, diarrhea, cough, nasal congestion. Last used cocaine 3 days prior to admission. Pt denies improvement of symptoms with nitroglycerin administered in the ER. She states that the only thing to relieve the pain is to sleep. She states that she has had a cough in the morning with some yellow sputum. She has some hemorrhoids and occasionally sees some blood on the toilet paper but no gross blood in the bowel movements and her bowel movements are not coated in blood. Denies abdominal pain, radiation of the chest pain anywhere, nausea, vomiting, diarrhea. Denies leg pain, headache.    Current Outpatient  Medications: Current Facility-Administered Medications  Medication Dose Route Frequency Provider Last Rate Last Dose  . ibuprofen (ADVIL,MOTRIN) tablet 800 mg  800 mg Oral Once Remer Macho, PA   800 mg at 10/12/11 2016  . iohexol (OMNIPAQUE) 300 MG/ML solution 80 mL  80 mL Intravenous Once PRN Medication Radiologist   80 mL at 10/12/11 1954  . LORazepam (ATIVAN) injection 1 mg  1 mg Intravenous Once Mirna Mires, MD   1 mg at 10/12/11 1740  . morphine 4 MG/ML injection 4 mg  4 mg Intravenous Once Remer Macho, PA   4 mg at 10/12/11 1958  . morphine 4 MG/ML injection 4 mg  4 mg Intravenous Once Remer Macho, PA   4 mg at 10/12/11 2016  . sodium chloride 0.9 % bolus 1,000 mL  1,000 mL Intravenous Once Remer Macho, PA   1,000 mL at 10/12/11 1910  . sodium chloride 0.9 % bolus 1,000 mL  1,000 mL Intravenous Once Remer Macho, PA   1,000 mL at 10/12/11 2246   Current Outpatient Prescriptions  Medication Sig Dispense Refill  . diphenhydrAMINE (BENADRYL) 25 mg capsule Take 25 mg by mouth every 6 (six) hours as needed. itching       . FLUoxetine (PROZAC) 40 MG capsule Take 40 mg by mouth daily. For depression       .  ibuprofen (ADVIL,MOTRIN) 400 MG tablet Take 400 mg by mouth every 6 (six) hours as needed. For pain       . lisinopril (PRINIVIL,ZESTRIL) 20 MG tablet Take 20 mg by mouth daily. For blood pressure       . Multiple Vitamin (MULTIVITAMIN) capsule Take 1 capsule by mouth daily.        . traMADol (ULTRAM) 50 MG tablet Take 50 mg by mouth every 6 (six) hours as needed. For pain       . vitamin B-12 (CYANOCOBALAMIN) 1000 MCG tablet Take 1,000 mcg by mouth daily.        Marland Kitchen zolpidem (AMBIEN) 10 MG tablet Take 10 mg by mouth at bedtime.          Allergies: Allergies  Allergen Reactions  . Acetaminophen     REACTION: eyelid swelling     Past Medical History: Past Medical History  Diagnosis Date  . 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  Followed by Dr. Louanne Skye  . Hypertension   . Cocaine abuse, THC abuse     ongoing with resultant vaculitis.  . Tobacco abuse   . Depression   . Pneumonia   . Normocytic anemia     BL Hgb 9.8-12.     Past Surgical History: Past Surgical History  Procedure Date  . Skin biopsy     bilateral shin nodules on 04/2010    Family History: Family History  Problem Relation Age of Onset  . Breast cancer Mother     Breast cancer  . Alcohol abuse Mother   . Cancer Mother     breast cancer  . Colon cancer Maternal Aunt   . Cancer Maternal Aunt     colon cancer  . Alcohol abuse Father     Social History: History   Social History  . Marital Status: Single    Spouse Name: N/A    Number of Children: N/A  . Years of Education: N/A   Occupational History  . Not on file.   Social History Main Topics  . Smoking status: Current Everyday Smoker -- 0.3 packs/day for 39 years    Types: Cigarettes  . Smokeless tobacco: Not on file  . Alcohol Use: No  . Drug Use: Yes    Special: "Crack" cocaine, Marijuana, Cocaine     smokes cocaine  . Sexually Active: Not Currently    Birth Control/ Protection: Condom   Other Topics Concern  . 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 admits to diaphoresis.  HEENT: denies photophobia, eye pain, redness, hearing loss, ear pain, congestion, sore throat, rhinorrhea, sneezing, neck pain, neck stiffness and tinnitus.  Respiratory: denies SOB, DOE, cough, chest tightness, and wheezing.  Cardiovascular: admits to chest pain, palpitations and Denies leg swelling.  Gastrointestinal: denies nausea, vomiting, abdominal pain, diarrhea, constipation, blood in stool.  Genitourinary: denies dysuria, urgency, frequency,  hematuria, flank pain and difficulty urinating.  Musculoskeletal: admits to  Leg pain chronic and unchanged. Denies myalgias, back pain, joint swelling.   Skin: admits to chronic ear wounds. Denies new rash and wound.  Neurological: denies dizziness, seizures, syncope, weakness, light-headedness, numbness and headaches.   Hematological: denies adenopathy, easy bruising, personal or family bleeding history.  Psychiatric/ Behavioral: denies suicidal  ideation, mood changes, confusion, nervousness, sleep disturbance and agitation.    Vital Signs: Blood pressure 159/91, pulse 110, temperature 100 F (37.8 C), temperature source Oral, resp. rate 31, SpO2 96.00%.  Physical Exam: General: Vital signs reviewed and noted. Well-developed, well-nourished, in mild to moderate acute distress; alert, appropriate and cooperative throughout examination.  Head: Normocephalic, atraumatic.  Eyes: PERRL, EOMI, No signs of anemia or jaundince. Appear to be bloodshot.   Nose: Mucous membranes moist, not inflammed, nonerythematous.  Throat: Oropharynx nonerythematous, no exudate appreciated.   Neck: No deformities, masses, or tenderness noted.Supple, No carotid Bruits, no JVD.  Lungs:  Normal respiratory effort. Clear to auscultation BL without crackles or wheezes.  Heart: Tachycardic, S1 and S2 hard to distinguish.  Abdomen:  BS normoactive. Soft, Nondistended, Voluntary guarding. No masses or organomegaly.  Extremities: No pretibial edema. No calf tenderness.   Neurologic: A&O X3, CN II - XII are grossly intact. Motor strength is 5/5 in the all 4 extremities, Sensations intact to light touch, Cerebellar signs negative.  Skin: No visible rashes, scars.   Lab results: Basic Metabolic Panel: Recent Labs  [facility] 10/12/11 1721   NA 141   K 3.3*   CL 104   CO2 27   GLUCOSE 127*   BUN 9   CREATININE 0.69   CALCIUM 9.0   MG --   PHOS --   CBC: Recent Labs  Basename 10/12/11 1721   WBC 7.3    NEUTROABS --   HGB 13.0   HCT 38.5   MCV 84.2   PLT 256   Cardiac Enzymes:  Ref. Range 10/12/2011 17:43  Troponin i, poc Latest Range: 0.00-0.08 ng/mL 0.01   D-Dimer: Recent Labs  Basename 10/12/11 1721   DDIMER 3.42*   CBG: Recent Labs  Basename 10/12/11 1805   GLUCAP 83    Anemia Panel: Lab Results  Component Value Date   IRON 19* 04/11/2010   TIBC 238* 04/11/2010   FERRITIN 101 04/11/2010   Urine Drug Screen:  Ref. Range 10/12/2011 18:29  Amphetamines Latest Range: NONE DETECTED  NONE DETECTED  Barbiturates Latest Range: NONE DETECTED  NONE DETECTED  Benzodiazepines Latest Range: NONE DETECTED  NONE DETECTED  Opiates Latest Range: NONE DETECTED  NONE DETECTED  COCAINE Latest Range: NONE DETECTED  POSITIVE (A)  Tetrahydrocannabinol Latest Range: NONE DETECTED  POSITIVE (A)     Imaging results:   Dg Chest 2 View (10/12/2011) - Bilateral nipple shadows. Decreased right upper lobe atelectasis versus infiltrate since previous exam.    Ct Angio Chest W/cm &/or Wo Cm (10/12/2011) - 1.  Negative for pulmonary embolism. 2.  Stable cardiomegaly. 3.  Scarring in the right upper lobe and scattered areas of atelectasis bilaterally.  No acute findings identified in the chest. 4.  Mild emphysema.   2D echo (06/2010) - LV EF 65-70%, mild concentric LVH, no regional wall motion abnormalities     Other results:  EKG (10/12/2011) - Sinus Tachycardia , regular rate of approximately 102 bpm, normal axis, ST segments: nonspecific ST changes and nonspecific T wave changes.     Assessment & Plan:  Pt is a 51 y.o. yo female with a PMHx of cocaine abuse and hypertension who was admitted on 10/12/2011 with symptoms of chest pain, which was determined to be secondary to cocaine use and rule out ACS. Interventions at this time will be focused on determining etiology.    Chest pain - Pt does have a history of cocaine abuse and likely is secondary  to this however EKG changes could be concerning and  will watch on telemetry overnight and get repeat eKG in the morning. Will also cycle CE overnight.    SIRS - Pt does have fever of 102 and tachycardia and tachypnea to 34 so meets criteria for SIRS. For more details on likely etiology please see discussion of fever. Will give 150 cc/hr of NaCl.    Polysubstance abuse - cocaine and THC positive on admission. Have counseled her to stop and may get social worker involved.    Tachycardia - HR 102 on EKG and likely elevated secondary to pain, will follow and watch with IVF. She has gotten 2 L in the ED.    Fever - Will check HIV, pt does have vasculitis which could cause fevers. No symptoms consistent with influenza so will not test or treat. No signs of infection in the lungs on the chest CT so not likely to be pneumonia. No signs of urinary symptoms. No alteration of mental status to suggest CNS infection. Will watch and draw blood cultures in case there has been IV drug use in the past. Will give 150 cc/hr of NaCl.   Hypokalemia - K 3.3 on admission likely secondary to poor PO intake at home. Will give repletion oral 40 mg times 2 while she is here and recheck.    Hypertension - Current blood pressure 159/91. Last dose of blood pressure medication was yesterday and likely also elevated acutely due to pain. Will treat appropriately and continue home lisinopril.    Depression - Continue her home medication. No active SI or HI. Well controlled.   Normocytic anemia - Has had anemia panel in the past with low iron, is supposed to be on iron. 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 Will get fecal occult blood while she is here.     Tobacco abuse - 3 cigarettes per day for approx 40 years. Counseled to quit.    DVT PPX - low molecular weight heparin    Chilton Greathouse, D.O.  PGY-II, Internal Medicine Resident 10/12/2011, 11:34 PM

## 2011-10-12 NOTE — ED Provider Notes (Signed)
History     CSN: [id number]  Arrival date & time 10/12/11  1626   First MD Initiated Contact with Patient 10/12/11 1631      Chief Complaint  Patient presents with  . Chest Pain    (Consider location/radiation/quality/duration/timing/severity/associated sxs/prior treatment) HPI History provided by pt.   Pt has had constant, pleuritic, sharp, non-radiating, substernal chest pain since waking this am.  Had mild pain last night as well.  Denies fever but has had cough.  Denies SOB and nausea but has had diaphoresis for the past two days.  Has also recently had nasal congestion, rhinorrhea, sore throat and occasional body aches and generalized weakness.  No recent trauma or heavy lifting. No RF for PE and denies LE pain/edema.  H/o anemia and HTN.  Last used cocaine three days ago.   Past Medical History  Diagnosis Date  . 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 Followed by Dr. Louanne Skye  . Hypertension   . Cocaine abuse   . Tobacco abuse   . Depression   . Pneumonia     Past Surgical History  Procedure Date  . Skin biopsy     bilateral shin nodules on 04/2010    Family History  Problem Relation Age of Onset  . Breast cancer Mother     Breast cancer  . Alcohol abuse Mother   . Cancer Mother     breast cancer  . Colon cancer Maternal Aunt   . Cancer Maternal Aunt     colon cancer  . Alcohol abuse Father     History  Substance Use Topics  . Smoking status: Current Everyday Smoker -- 0.3 packs/day    Types: Cigarettes  . Smokeless tobacco: Not on file  . Alcohol Use: No    OB History    Grav Para Term Preterm Abortions TAB SAB Ect Mult Living                  Review of Systems  All other systems reviewed and are negative.    Allergies  Acetaminophen  Home Medications   Current Outpatient Rx  Name Route Sig Dispense Refill  . DIPHENHYDRAMINE HCL 25 MG PO CAPS Oral Take 25 mg by mouth every 6 (six) hours as needed. itching     . FLUOXETINE HCL 40 MG PO CAPS  Oral Take 40 mg by mouth daily. For depression     . IBUPROFEN 400 MG PO TABS Oral Take 400 mg by mouth every 6 (six) hours as needed. For pain     . LISINOPRIL 20 MG PO TABS Oral Take 20 mg by mouth daily. For blood pressure     . MULTIVITAMINS PO CAPS Oral Take 1 capsule by mouth daily.      . TRAMADOL HCL 50 MG PO TABS Oral Take 50 mg by mouth every 6 (six) hours as needed. For pain     . VITAMIN B-12 1000 MCG PO TABS Oral Take 1,000 mcg by mouth daily.      Marland Kitchen ZOLPIDEM TARTRATE 10 MG PO TABS Oral Take 10 mg by mouth at bedtime.        BP 188/95  Pulse 100  Temp 99.9 F (37.7 C)  Resp 22  SpO2 99%  Physical Exam  Nursing note and vitals reviewed. Constitutional: She is oriented to person, place, and time. She appears well-developed and well-nourished.       tearful  HENT:  Head: Normocephalic and atraumatic.  Eyes:  Conjunctival injection.  No pallor.   Neck: Normal range of motion.  Cardiovascular: Normal rate and regular rhythm.   Pulmonary/Chest: Effort normal and breath sounds normal.       Tenderness of sternum and LSB.  Pt appears uncomfortable w/ movement but pain is not reproduced w/ ROM LUE.   Abdominal: Soft. Bowel sounds are normal. She exhibits no distension. There is no tenderness.  Musculoskeletal:       No peripheral edema or cal tenderness  Neurological: She is alert and oriented to person, place, and time.  Skin: Skin is warm and dry. No rash noted.  Psychiatric: She has a normal mood and affect. Her behavior is normal.    ED Course  Procedures (including critical care time)   Date: 10/13/2011  Rate: 102  Rhythm: sinus tachycardia  QRS Axis: normal  Intervals: normal  ST/T Wave abnormalities: normal  Conduction Disutrbances:none  Narrative Interpretation: LVH  Old EKG Reviewed: changes noted (new LVH)   Labs Reviewed  BASIC METABOLIC PANEL - Abnormal; Notable for the following:    Potassium 3.3 (*)    Glucose, Bld 127 (*)    All other  components within normal limits  D-DIMER, QUANTITATIVE - Abnormal; Notable for the following:    D-Dimer, Quant 3.42 (*)    All other components within normal limits  CBC  POCT I-STAT TROPONIN I  GLUCOSE, CAPILLARY  I-STAT TROPONIN I  URINE RAPID DRUG SCREEN (HOSP PERFORMED)  URINE RAPID DRUG SCREEN (HOSP PERFORMED)  URINALYSIS, ROUTINE W REFLEX MICROSCOPIC   Dg Chest 2 View  10/12/2011  *RADIOLOGY REPORT*  Clinical Data: Chest pain, hypertension  CHEST - 2 VIEW  Comparison: 07/28/2011  Findings: Enlargement of cardiac silhouette. Mediastinal contours and pulmonary vascularity normal. Decreased atelectasis versus infiltrate right upper lobe. Remaining lungs clear. No pleural effusion or pneumothorax. Bilateral nipple shadows unchanged. No acute osseous findings.  IMPRESSION: Bilateral nipple shadows. Decreased right upper lobe atelectasis versus infiltrate since previous exam.  Original Report Authenticated By: Burnetta Sabin, M.D.     1. Chest pain   2. Cocaine abuse   3. Unspecified essential hypertension   4. DEPRESSION, SEVERE   5. VASCULITIS       MDM  Pt presents w/ pleuritic CP.  Last used cocaine 3 days ago.  On exam, afebrile, tachycardic, tachypneic, pain reproducible w/ movement and palpation, abd benign and non-tender, no peripheral edema/ttp.  EKG non-ischemic.  CXR neg.  Labs sig for pos cocaine and  elevated d-dimer (ordered d/t characteristics of pain + tachycardia, tachypnea).   CT angio chest pending.    Nursing staff reports temp 102.4.  Ibuprofen ordered as well as U/A to look for another source of fever.  Pt may have influenza.    CT angio chest neg.  All results discussed w/ pt.  She continues to have pain in her chest despite aspirin and two rounds of morphine.  HR 115 after 2.5L bolus.  Temp improved to 100.7.  [facility] consulted for admission for persistent CP in setting of cocaine abuse.  Second troponin ordered.        Remer Macho, PA 10/13/11  0040

## 2011-10-12 NOTE — ED Notes (Signed)
Pt in c-t wanting something for pain,.  Morphine ordered but iv not running very well if at all.  Will need to restart when she returns to her room

## 2011-10-12 NOTE — ED Notes (Signed)
Pt reports left sided chest pain nonradiating that started this am. Pt reports cough and she has felt warm. 12lead unremarkable. 324asa and 1nitro given en route. No IV access. Reports no relief with nitro.

## 2011-10-12 NOTE — ED Notes (Signed)
Pt sleeping intermittently

## 2011-10-12 NOTE — ED Notes (Signed)
Patient transported to X-ray 

## 2011-10-13 ENCOUNTER — Encounter (HOSPITAL_COMMUNITY): Payer: Self-pay | Admitting: *Deleted

## 2011-10-13 ENCOUNTER — Other Ambulatory Visit: Payer: Medicaid Other

## 2011-10-13 DIAGNOSIS — L089 Local infection of the skin and subcutaneous tissue, unspecified: Secondary | ICD-10-CM | POA: Diagnosis present

## 2011-10-13 DIAGNOSIS — R079 Chest pain, unspecified: Secondary | ICD-10-CM | POA: Diagnosis present

## 2011-10-13 DIAGNOSIS — E876 Hypokalemia: Secondary | ICD-10-CM | POA: Diagnosis present

## 2011-10-13 LAB — CBC
MCHC: 34.5 g/dL (ref 30.0–36.0)
Platelets: 247 10*3/uL (ref 150–400)
RDW: 13.8 % (ref 11.5–15.5)
WBC: 8.4 10*3/uL (ref 4.0–10.5)

## 2011-10-13 LAB — CARDIAC PANEL(CRET KIN+CKTOT+MB+TROPI)
CK, MB: 1.8 ng/mL (ref 0.3–4.0)
CK, MB: 1.7 ng/mL (ref 0.3–4.0)
Relative Index: 1.5 (ref 0.0–2.5)
Relative Index: INVALID (ref 0.0–2.5)
Total CK: 120 U/L (ref 7–177)
Total CK: 111 U/L (ref 7–177)
Troponin I: <0.3 ng/mL (ref <0.30)

## 2011-10-13 LAB — COMPREHENSIVE METABOLIC PANEL
ALT: 8 U/L (ref 0–35)
Albumin: 2.9 g/dL — ABNORMAL LOW (ref 3.5–5.2)
Alkaline Phosphatase: 77 U/L (ref 39–117)
Calcium: 8 mg/dL — ABNORMAL LOW (ref 8.4–10.5)
GFR calc Af Amer: >90 mL/min (ref >90)
Glucose, Bld: 118 mg/dL — ABNORMAL HIGH (ref 70–99)
Potassium: 3 mEq/L — ABNORMAL LOW (ref 3.5–5.1)
Sodium: 137 mEq/L (ref 135–145)
Total Protein: 6.3 g/dL (ref 6.0–8.3)

## 2011-10-13 LAB — TSH: TSH: 0.755 u[IU]/mL (ref 0.350–4.500)

## 2011-10-13 LAB — CULTURE, BLOOD (ROUTINE X 2)
Culture  Setup Time: 201301080836
Culture  Setup Time: 201301080836
Culture: NO GROWTH

## 2011-10-13 LAB — MAGNESIUM: Magnesium: 1.5 mg/dL (ref 1.5–2.5)

## 2011-10-13 LAB — MRSA PCR SCREENING: MRSA by PCR: POSITIVE — AB

## 2011-10-13 LAB — PROTIME-INR
INR: 1.11 (ref 0.00–1.49)
Prothrombin Time: 14.5 seconds (ref 11.6–15.2)

## 2011-10-13 MED ORDER — ASPIRIN EC 81 MG PO TBEC
81.0000 mg | DELAYED_RELEASE_TABLET | Freq: Every day | ORAL | Status: DC
  Administered 2011-10-14: 81 mg via ORAL
  Filled 2011-10-13: qty 1

## 2011-10-13 MED ORDER — KETOROLAC TROMETHAMINE 30 MG/ML IJ SOLN
30.0000 mg | Freq: Four times a day (QID) | INTRAMUSCULAR | Status: DC
  Administered 2011-10-13 – 2011-10-14 (×4): 30 mg via INTRAVENOUS
  Filled 2011-10-13 (×8): qty 1

## 2011-10-13 MED ORDER — NITROGLYCERIN 0.4 MG SL SUBL
0.4000 mg | SUBLINGUAL_TABLET | SUBLINGUAL | Status: DC | PRN
  Administered 2011-10-13 (×3): 0.4 mg via SUBLINGUAL
  Filled 2011-10-13: qty 75

## 2011-10-13 MED ORDER — ONDANSETRON HCL 4 MG/2ML IJ SOLN
4.0000 mg | Freq: Four times a day (QID) | INTRAMUSCULAR | Status: DC | PRN
  Administered 2011-10-14: 4 mg via INTRAVENOUS
  Filled 2011-10-13: qty 2

## 2011-10-13 MED ORDER — POTASSIUM CHLORIDE CRYS ER 20 MEQ PO TBCR
40.0000 meq | EXTENDED_RELEASE_TABLET | ORAL | Status: AC
  Administered 2011-10-13 (×2): 40 meq via ORAL
  Filled 2011-10-13 (×2): qty 2

## 2011-10-13 MED ORDER — POTASSIUM CHLORIDE CRYS ER 20 MEQ PO TBCR: 40.0000 meq | EXTENDED_RELEASE_TABLET | Freq: Two times a day (BID) | ORAL | Status: DC

## 2011-10-13 MED ORDER — DIPHENHYDRAMINE HCL 25 MG PO CAPS: 25.0000 mg | ORAL_CAPSULE | Freq: Four times a day (QID) | ORAL | Status: DC | PRN

## 2011-10-13 MED ORDER — ALPRAZOLAM 0.25 MG PO TABS
0.2500 mg | ORAL_TABLET | Freq: Two times a day (BID) | ORAL | Status: DC | PRN
  Administered 2011-10-13 (×2): 0.25 mg via ORAL
  Filled 2011-10-13 (×2): qty 1

## 2011-10-13 MED ORDER — SODIUM CHLORIDE 0.9 % IV SOLN
INTRAVENOUS | Status: DC
  Administered 2011-10-13: 150 mL/h via INTRAVENOUS
  Administered 2011-10-13: 15:00:00 via INTRAVENOUS
  Administered 2011-10-13: 150 mL/h via INTRAVENOUS
  Administered 2011-10-14: 06:00:00 via INTRAVENOUS

## 2011-10-13 MED ORDER — ENOXAPARIN SODIUM 40 MG/0.4ML ~~LOC~~ SOLN
40.0000 mg | SUBCUTANEOUS | Status: DC
  Administered 2011-10-13 – 2011-10-14 (×2): 40 mg via SUBCUTANEOUS
  Filled 2011-10-13 (×2): qty 0.4

## 2011-10-13 MED ORDER — LISINOPRIL 20 MG PO TABS
20.0000 mg | ORAL_TABLET | Freq: Every day | ORAL | Status: DC
  Administered 2011-10-13 – 2011-10-14 (×2): 20 mg via ORAL
  Filled 2011-10-13 (×2): qty 1

## 2011-10-13 MED ORDER — DOXYCYCLINE HYCLATE 100 MG PO TABS
100.0000 mg | ORAL_TABLET | Freq: Two times a day (BID) | ORAL | Status: DC
  Administered 2011-10-13: 100 mg via ORAL
  Filled 2011-10-13 (×4): qty 1

## 2011-10-13 MED ORDER — CHLORHEXIDINE GLUCONATE CLOTH 2 % EX PADS
6.0000 | MEDICATED_PAD | Freq: Every day | CUTANEOUS | Status: DC
  Administered 2011-10-13 – 2011-10-14 (×2): 6 via TOPICAL

## 2011-10-13 MED ORDER — ZOLPIDEM TARTRATE 5 MG PO TABS
10.0000 mg | ORAL_TABLET | Freq: Every day | ORAL | Status: DC
  Administered 2011-10-13: 10 mg via ORAL
  Filled 2011-10-13: qty 2

## 2011-10-13 MED ORDER — FLUOXETINE HCL 20 MG PO TABS
40.0000 mg | ORAL_TABLET | Freq: Every day | ORAL | Status: DC
  Administered 2011-10-13 – 2011-10-14 (×2): 40 mg via ORAL
  Filled 2011-10-13 (×2): qty 2

## 2011-10-13 MED ORDER — MORPHINE SULFATE 4 MG/ML IJ SOLN
4.0000 mg | INTRAMUSCULAR | Status: DC | PRN
  Administered 2011-10-13 – 2011-10-14 (×7): 4 mg via INTRAVENOUS
  Filled 2011-10-13 (×7): qty 1

## 2011-10-13 MED ORDER — MUPIROCIN 2 % EX OINT
1.0000 "application " | TOPICAL_OINTMENT | Freq: Two times a day (BID) | CUTANEOUS | Status: DC
  Administered 2011-10-13 – 2011-10-14 (×3): 1 via NASAL
  Filled 2011-10-13: qty 22

## 2011-10-13 NOTE — Progress Notes (Signed)
Subjective: -Patient complains of pain and asks if it is time for her pain medicines -Denies shortness of breath -pain with leaning forward and with taking a deep breath -eating food okay and taking some fluids -pain medication helps by putting her to sleep  Objective: Vital signs in last 24 hours: Filed Vitals:   10/13/11 0245 10/13/11 0258 10/13/11 0319 10/13/11 1400  BP: 136/82  120/71 158/88  Pulse: 102 88 101 104  Temp:   97.9 F (36.6 C) 100.7 F (38.2 C)  TempSrc:   Oral Oral  Resp: 34  18 18  Height:   5' 1"  (1.549 m)   Weight:   107 lb 5.8 oz (48.7 kg)   SpO2: 95%  96% 96%   Weight change:   Intake/Output Summary (Last 24 hours) at 10/13/11 1531 Last data filed at 10/13/11 1300  Gross per 24 hour  Intake   1350 ml  Output      3 ml  Net   1347 ml   Physical Exam: General: thin woman who acts acutely uncomfortable resting in bed HEENT: PERRL, EOMI, no scleral icterus Cardiac: RRR, no rubs, murmurs or gallops Pulm: coarse scratchy sounding inspiration heard diffusely, tachypneic, moving reduced volumes of air Abd: soft, nontender, nondistended, BS present, patient has 2 inch by 1 raised erythematous area in her right inguinal region. It is warm and tender to palpation. Ext: warm and well perfused, no pedal edema Neuro: alert and oriented X3, cranial nerves II-XII grossly intact  Lab Results: Basic Metabolic Panel:  Lab 91/47/82 0316 10/12/11 1721  NA 137 141  K 3.0* 3.3*  CL 104 104  CO2 24 27  GLUCOSE 118* 127*  BUN 6 9  CREATININE 0.60 0.69  CALCIUM 8.0* 9.0  MG 1.5 --  PHOS -- --   Liver Function Tests:  Lab 10/13/11 0316  AST 14  ALT 8  ALKPHOS 77  BILITOT 0.4  PROT 6.3  ALBUMIN 2.9*   CBC:  Lab 10/13/11 0316 10/12/11 1721  WBC 8.4 7.3  NEUTROABS -- --  HGB 11.0* 13.0  HCT 31.9* 38.5  MCV 82.9 84.2  PLT 247 256   Cardiac Enzymes:  Lab 10/13/11 0837 10/13/11 0316  CKTOTAL 111 120  CKMB 1.7 1.8  CKMBINDEX -- --  TROPONINI <0.30  <0.30    Lab 10/12/11 1721  DDIMER 3.42*   CBG:  Lab 10/12/11 1805  GLUCAP 83   Thyroid Function Tests:  Lab 10/13/11 0316  TSH 0.755  T4TOTAL --  FREET4 --  T3FREE --  THYROIDAB --   Coagulation:  Lab 10/13/11 0316  LABPROT 14.5  INR 1.11   Urine Drug Screen: Drugs of Abuse     Component Value Date/Time   LABOPIA NONE DETECTED 10/12/2011 1829   LABOPIA POSITIVE* 06/12/2011 0221   COCAINSCRNUR POSITIVE* 10/12/2011 1829   COCAINSCRNUR NEGATIVE 06/12/2011 0221   LABBENZ NONE DETECTED 10/12/2011 1829   LABBENZ NEGATIVE 06/12/2011 0221   AMPHETMU NONE DETECTED 10/12/2011 1829   AMPHETMU NEGATIVE 06/12/2011 0221   THCU POSITIVE* 10/12/2011 [place] DETECTED 10/12/2011 1829    Micro Results: Recent Results (from the past 240 hour(s))  MRSA PCR SCREENING     Status: Abnormal   Collection Time   10/13/11  8:21 AM      Component Value Range Status Comment   MRSA by PCR POSITIVE (*) NEGATIVE  Final    Studies/Results: Dg Chest 2 View  10/12/2011  *RADIOLOGY REPORT*  Clinical Data: Chest  pain, hypertension  CHEST - 2 VIEW  Comparison: 07/28/2011  Findings: Enlargement of cardiac silhouette. Mediastinal contours and pulmonary vascularity normal. Decreased atelectasis versus infiltrate right upper lobe. Remaining lungs clear. No pleural effusion or pneumothorax. Bilateral nipple shadows unchanged. No acute osseous findings.  IMPRESSION: Bilateral nipple shadows. Decreased right upper lobe atelectasis versus infiltrate since previous exam.  Original Report Authenticated By: Burnetta Sabin, M.D.   Ct Angio Chest W/cm &/or Wo Cm  10/12/2011  *RADIOLOGY REPORT*  Clinical Data: Elevated D-dimer, pleuritic chest pain, and tachycardia.  CT ANGIOGRAPHY CHEST  Technique:  Multidetector CT imaging of the chest using the standard protocol during bolus administration of intravenous contrast. Multiplanar reconstructed images including MIPs were obtained and reviewed to evaluate the vascular anatomy.   Comparison: Chest radiograph 10/12/2011 and chest CT 06/11/2011  Findings: This is a satisfactory evaluation of the pulmonary arterial tree.  No evidence of pulmonary arterial filling defect. Thoracic aorta is normal in caliber and enhancement.  No evidence of aortic dissection.  Stable cardiomegaly.  Negative for lymphadenopathy, pleural effusion, or pericardial effusion.  Mild centrilobular emphysema is present.  There is band-like scarring in the right upper lob, likely secondary to previous infection in this region on chest CT of September 2012.  Scattered areas of dependent atelectasis.  No airspace consolidation or pulmonary edema is identified.  Soft tissues of the breasts are unremarkable by chest CT.  No acute or suspicious bony abnormality.  No acute findings in the imaged portion of the upper abdomen.  IMPRESSION:  1.  Negative for pulmonary embolism. 2.  Stable cardiomegaly. 3.  Scarring in the right upper lobe and scattered areas of atelectasis bilaterally.  No acute findings identified in the chest. 4.  Mild emphysema.  Original Report Authenticated By: Curlene Dolphin, M.D.   Medications: I have reviewed the patient's current medications. Scheduled Meds:    . aspirin EC  81 mg Oral Daily  . Chlorhexidine Gluconate Cloth  6 each Topical Q0600  . enoxaparin  40 mg Subcutaneous Q24H  . FLUoxetine  40 mg Oral Daily  . ibuprofen  800 mg Oral Once  . ketorolac  30 mg Intravenous Q6H  . lisinopril  20 mg Oral Daily  . LORazepam  1 mg Intravenous Once  .  morphine injection  4 mg Intravenous Once  .  morphine injection  4 mg Intravenous Once  . mupirocin ointment  1 application Nasal BID  . potassium chloride  40 mEq Oral Q4H  . potassium chloride  40 mEq Oral BID  . sodium chloride  1,000 mL Intravenous Once  . sodium chloride  1,000 mL Intravenous Once  . zolpidem  10 mg Oral QHS   Continuous Infusions:    . sodium chloride 150 mL/hr (10/13/11 0648)   PRN Meds:.ALPRAZolam,  diphenhydrAMINE, iohexol, morphine injection, nitroGLYCERIN, ondansetron (ZOFRAN) IV Assessment/Plan: Principal Problem:  *Chest pain- patient has small pericardial effusion which may be related to viral cause or to known vasculitis. Will treat with toradol QID and morphine. Will taper morphine since patient is taking large doses of morphine. Active Problems:  DEPRESSION, SEVERE- will continue home fluoxetine  ESSENTIAL HYPERTENSION- will continue home lisinopril, appears well controlled  ARTHRITIS  Hypokalemia- was given two doses of KCL PO overnight, today's value only reflected 1 dose.  Will see how potassium is tomorrow AM and replete further if necessary  Skin infection- patient has tender wound on right inguinal area. Will begin treatment with doxycycline PO.   LOS: 1  day   Hans Eden 10/13/2011, 3:31 PM

## 2011-10-13 NOTE — Progress Notes (Signed)
Pt smokes 2 cigarettes per day and currently has bad chest pain. RN is aware but she thinks pt has drug seeking tendencies. Pt verbalizes understanding of risk factors though she won't say if she'll quit or not. Referred to 1-800 quit now for f/u and support. Discussed oral fixation substitutes, second hand smoke and in home smoking policy. Reviewed and gave pt Written education/contact information.

## 2011-10-13 NOTE — H&P (Signed)
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  Longtime homeless but currently living at [facility].  Admitted for central chest pain, constant and pleuritic.  UDS positive for cocaine.  Cor regular with 3/6 systolic (or rub??).  Had temp spike to 102.  Troponin nl.  EKG nl.  dDimer up but CTA negative.  CXR: right upper lobe atelectasis (old and improved). Lungs clear.  Imp: Severe chest pain with cocaine.  Loud murmur vs rub. Needs echo to exclude pericarditis. Needs morphine for pain. Ask social worker to help her try to find contact info for her family.  She has a mother and sister in [place] whom she has not seen in many years.

## 2011-10-13 NOTE — ED Notes (Signed)
Pt. Alert and oriented, transferred to the floor via stretcher with RN and cardiac monitor, NAD noted, respirations even and regular,

## 2011-10-13 NOTE — Progress Notes (Signed)
  Echocardiogram 2D Echocardiogram has been performed.  [name] [name] 10/13/2011, 12:03 PM

## 2011-10-14 DIAGNOSIS — I309 Acute pericarditis, unspecified: Secondary | ICD-10-CM | POA: Diagnosis present

## 2011-10-14 LAB — CBC
Hemoglobin: 9.5 g/dL — ABNORMAL LOW (ref 12.0–15.0)
Platelets: 206 10*3/uL (ref 150–400)
RBC: 3.37 MIL/uL — ABNORMAL LOW (ref 3.87–5.11)
WBC: 4.5 10*3/uL (ref 4.0–10.5)

## 2011-10-14 LAB — BASIC METABOLIC PANEL
GFR calc Af Amer: >90 mL/min (ref >90)
GFR calc non Af Amer: >90 mL/min (ref >90)
Potassium: 3.6 mEq/L (ref 3.5–5.1)
Sodium: 137 mEq/L (ref 135–145)

## 2011-10-14 MED ORDER — DOXYCYCLINE HYCLATE 100 MG PO TABS
100.0000 mg | ORAL_TABLET | Freq: Two times a day (BID) | ORAL | Status: DC
  Administered 2011-10-14: 100 mg via ORAL
  Filled 2011-10-14 (×2): qty 1

## 2011-10-14 MED ORDER — DOXYCYCLINE HYCLATE 100 MG PO TABS: 100.0000 mg | ORAL_TABLET | Freq: Two times a day (BID) | ORAL | Status: AC

## 2011-10-14 MED ORDER — KETOROLAC TROMETHAMINE 10 MG PO TABS: 10.0000 mg | ORAL_TABLET | Freq: Four times a day (QID) | ORAL | Status: AC | PRN

## 2011-10-14 MED ORDER — DOXYCYCLINE HYCLATE 100 MG PO CAPS
100.0000 mg | ORAL_CAPSULE | Freq: Two times a day (BID) | ORAL | Status: DC
  Filled 2011-10-14 (×2): qty 1

## 2011-10-14 MED ORDER — PHENOBARBITAL NICU INJ SYRINGE 130 MG/ML: 1000.0000 mg | INJECTION | Freq: Once | INTRAMUSCULAR | Status: DC

## 2011-10-14 MED ORDER — MORPHINE SULFATE 2 MG/ML IJ SOLN
2.0000 mg | INTRAMUSCULAR | Status: DC | PRN
  Administered 2011-10-14: 2 mg via INTRAVENOUS
  Filled 2011-10-14: qty 1

## 2011-10-14 NOTE — Progress Notes (Signed)
Pt with nausea and emesis this afternoon, Zofran given as ordered for nausea Lijah Bourque, Bettina Gavia RN

## 2011-10-14 NOTE — Discharge Summary (Signed)
Internal [facility] Discharge Note  Name: [name] MRN: [id number] DOB: 15-Dec-1960 51 y.o.  Date of Admission: 10/12/2011  4:26 PM Date of Discharge: 10/14/2011 Attending Physician: Milta Deiters  Discharge Diagnosis: Principal Problem:  Chest pain due to pericarditis- patient was found to have small pericardial effusion and had friction rub heard on exam. Likely due to viral cause although patient also has known vasculitis. Active Problems:  DEPRESSION, SEVERE  ESSENTIAL HYPERTENSION  Hypokalemia- initially 3.0, repleted  Skin infection- patient had small area of erythema, edema in right inguinal area.  Discharge Medications: Current Discharge Medication List    START taking these medications   Details  doxycycline (VIBRA-TABS) 100 MG tablet Take 1 tablet (100 mg total) by mouth 2 (two) times daily. Qty: 10 tablet, Refills: 0    ketorolac (TORADOL) 10 MG tablet Take 1 tablet (10 mg total) by mouth every 6 (six) hours as needed for pain. Qty: 16 tablet, Refills: 0      CONTINUE these medications which have NOT CHANGED   Details  diphenhydrAMINE (BENADRYL) 25 mg capsule Take 25 mg by mouth every 6 (six) hours as needed. itching     FLUoxetine (PROZAC) 40 MG capsule Take 40 mg by mouth daily. For depression     lisinopril (PRINIVIL,ZESTRIL) 20 MG tablet Take 20 mg by mouth daily. For blood pressure     Multiple Vitamin (MULTIVITAMIN) capsule Take 1 capsule by mouth daily.      traMADol (ULTRAM) 50 MG tablet Take 50 mg by mouth every 6 (six) hours as needed. For pain     vitamin B-12 (CYANOCOBALAMIN) 1000 MCG tablet Take 1,000 mcg by mouth daily.      zolpidem (AMBIEN) 10 MG tablet Take 10 mg by mouth at bedtime.        STOP taking these medications     ibuprofen (ADVIL,MOTRIN) 400 MG tablet         Disposition and follow-up:   [name] was discharged from [facility] in Stable condition.    Follow-up  Appointments: Follow-up Information    Follow up with ILLATH,JASEELA on 10/22/2011. (Appt made for Thursday Jan 17th at 3:45)    Contact information:   [place] [phone]         Discharge Orders    Future Appointments: Provider: Department: Dept Phone: Center:   10/22/2011 3:45 PM [place] [facility] [phone] [facility]     Future Orders Please Complete By Expires   Diet - low sodium heart healthy      Increase activity slowly      Discharge instructions      Comments:   Please do not take any advil, ibuprofen, naproxen, aleeve or other NSAID medication while you complete the ketorolac (toradol) pills for your chest pain. When these pills finish, you can resume taking ibuprofen.     Procedures Performed:  Dg Chest 2 View  10/12/2011  *RADIOLOGY REPORT*  Clinical Data: Chest pain, hypertension  CHEST - 2 VIEW  Comparison: 07/28/2011  Findings: Enlargement of cardiac silhouette. Mediastinal contours and pulmonary vascularity normal. Decreased atelectasis versus infiltrate right upper lobe. Remaining lungs clear. No pleural effusion or pneumothorax. Bilateral nipple shadows unchanged. No acute osseous findings.  IMPRESSION: Bilateral nipple shadows. Decreased right upper lobe atelectasis versus infiltrate since previous exam.  Original Report Authenticated By: Burnetta Sabin, M.D.   Ct Angio Chest W/cm &/or Wo Cm  10/12/2011  *RADIOLOGY REPORT*  Clinical Data: Elevated D-dimer, pleuritic chest pain, and tachycardia.  CT ANGIOGRAPHY CHEST  Technique:  Multidetector CT imaging of the chest using the standard protocol during bolus administration of intravenous contrast. Multiplanar reconstructed images including MIPs were obtained and reviewed to evaluate the vascular anatomy.  Comparison: Chest radiograph 10/12/2011 and chest CT 06/11/2011  Findings: This is a satisfactory evaluation of the pulmonary arterial tree.  No evidence of pulmonary  arterial filling defect. Thoracic aorta is normal in caliber and enhancement.  No evidence of aortic dissection.  Stable cardiomegaly.  Negative for lymphadenopathy, pleural effusion, or pericardial effusion.  Mild centrilobular emphysema is present.  There is band-like scarring in the right upper lob, likely secondary to previous infection in this region on chest CT of September 2012.  Scattered areas of dependent atelectasis.  No airspace consolidation or pulmonary edema is identified.  Soft tissues of the breasts are unremarkable by chest CT.  No acute or suspicious bony abnormality.  No acute findings in the imaged portion of the upper abdomen.  IMPRESSION:  1.  Negative for pulmonary embolism. 2.  Stable cardiomegaly. 3.  Scarring in the right upper lobe and scattered areas of atelectasis bilaterally.  No acute findings identified in the chest. 4.  Mild emphysema.  Original Report Authenticated By: Curlene Dolphin, M.D.   Mm Digital Screening  09/16/2011  DG SCREEN MAMMOGRAM BILATERAL Bilateral CC and MLO view(s) were taken.  DIGITAL SCREENING MAMMOGRAM WITH CAD: The breast tissue is heterogeneously dense.  A possible mass is noted in the left breast.  Spot  compression views and possibly sonography are recommended for further evaluation.  In the right  breast, no masses or malignant type calcifications are identified.  Compared with prior studies.  Images were processed with CAD.  IMPRESSION: Possible mass, left breast.  Additional evaluation is indicated.  The patient will be contacted for additional studies and a supplementary report will follow.  No specific mammographic evidence of  malignancy, right breast.  ASSESSMENT: Need additional imaging evaluation and/or prior mammograms for comparison - BI-RADS 0  Further imaging of the left breast. ,   2D Echo:  Study Conclusions  - Left ventricle: The cavity size was normal. Wall thickness was increased in a pattern of mild LVH. Systolic function was  normal. The estimated ejection fraction was in the range of 55% to 60%. Wall motion was normal; there were no regional wall motion abnormalities. Left ventricular diastolic function parameters were normal. - Left atrium: The atrium was mildly dilated. - Atrial septum: No defect or patent foramen ovale was identified. - Pulmonary arteries: Systolic pressure was mildly increased. PA peak pressure: 4m Hg (S). - Pericardium, extracardiac: A small, free-flowing pericardial effusion was identified circumferential to the heart. There was no echocardiographic evidence for tamponade physiology. Transthoracic echocardiography. M-mode, complete 2D, spectral Doppler, and color Doppler. Height: Height: 154.9cm. Height: 61in. Weight: Weight: 47.6kg. Weight: 104.8lb. Body mass index: BMI: 19.8kg/m^2. Body surface area: BSA: 1.472m. Blood pressure: 120/71. Patient status: Inpatient. Location: Bedside.  Admission HPI:  Patient is a [age].o. 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 Onset was 1 day ago when she was laying down, with unchanged course since that time. The patient describes the pain as constant, excruciating in nature, does not radiate. Patient rates pain as a 10/10 in intensity. Associated symptoms are diaphoresis, palpitations.. Aggravating factors are positional changes, deep inspiration. Alleviating  factors are: none.. Patient's cardiac risk factors are smoking/ tobacco exposure, hypertension. Patient's risk factors for DVT/PE: none. Previous cardiac testing: echocardiogram. Denies nausea, vomiting, diarrhea, cough, nasal congestion. Last used cocaine 3 days prior to admission. Pt denies improvement of symptoms with nitroglycerin administered in the ER. She states that the only thing to relieve the pain is to sleep. She states that she has had a cough in the morning  with some yellow sputum. She has some hemorrhoids and occasionally sees some blood on the toilet paper but no gross blood in the bowel movements and her bowel movements are not coated in blood. Denies abdominal pain, radiation of the chest pain anywhere, nausea, vomiting, diarrhea. Denies leg pain, headache.   Hospital Course by problem list: Principal Problem:  Chest pain- likely due to pericarditis. Patient had 10/10 chest pain that was pleuritic and was heard to have a friction rub on exam. An echo was performed which showed a small pericardial effusion. Patient's pain was treated with morphine IV which was not controlling her pain very well. We started toradol IV which greatly helped her pain. She was initially taking very shallow, rapid breaths. After her pain resolved she began to breathe at a normal rate. The cause of her pericarditis may be a viral illness or  Her known vasculitis or could be related to cancer. She notably had a concerning mass noted in the left breast on mammography in December. Please have her follow up with necessary breast imaging to further evaluate for malignancy. Patient was evaluated for a pulmonary embolus in the ED since she had an elevated D-dimer. This was negative for PE.  DEPRESSION, SEVERE  ESSENTIAL HYPERTENSION  Hypokalemia- patient may need daily repletion as an outpatient. Please check BMET as an outptatient.  Skin infection- sent out on 5 days of BID doxycycline for possible MRSA inguinal skin infection.  Discharge Vitals:  BP 137/84  Pulse 81  Temp(Src) 97.4 F (36.3 C) (Oral)  Resp 18  Ht 5' 1"  (1.549 m)  Wt 107 lb 5.8 oz (48.7 kg)  BMI 20.29 kg/m2  SpO2 94%  Discharge Labs:  Results for orders placed during the hospital encounter of 10/12/11 (from the past 24 hour(s))  CARDIAC PANEL(CRET KIN+CKTOT+MB+TROPI)     Status: Normal   Collection Time   10/13/11  4:47 PM      Component Value Range   Total CK 93  7 - 177 (U/L)   CK, MB 1.7  0.3 - 4.0  (ng/mL)   Troponin I <0.30  <0.30 (ng/mL)   Relative Index RELATIVE INDEX IS INVALID  0.0 - 2.5   BASIC METABOLIC PANEL     Status: Normal   Collection Time   10/14/11  5:38 AM      Component Value Range   Sodium 137  135 - 145 (mEq/L)   Potassium 3.6  3.5 - 5.1 (mEq/L)   Chloride 106  96 - 112 (mEq/L)   CO2 22  19 - 32 (mEq/L)   Glucose, Bld 77  70 - 99 (mg/dL)   BUN 7  6 - 23 (mg/dL)   Creatinine, Ser 0.54  0.50 - 1.10 (mg/dL)   Calcium 8.6  8.4 - 10.5 (mg/dL)   GFR calc non Af Amer >90  >90 (mL/min)   GFR calc Af Amer >90  >90 (mL/min)  CBC     Status: Abnormal   Collection Time   10/14/11  5:38 AM      Component Value Range  WBC 4.5  4.0 - 10.5 (K/uL)   RBC 3.37 (*) 3.87 - 5.11 (MIL/uL)   Hemoglobin 9.5 (*) 12.0 - 15.0 (g/dL)   HCT 28.0 (*) 36.0 - 46.0 (%)   MCV 83.1  78.0 - 100.0 (fL)   MCH 28.2  26.0 - 34.0 (pg)   MCHC 33.9  30.0 - 36.0 (g/dL)   RDW 13.6  11.5 - 15.5 (%)   Platelets 206  150 - 400 (K/uL)   Time spent on the discharge summary was more than 30 minutes.   SignedHans Eden 10/14/2011, 1:53 PM

## 2011-10-14 NOTE — ED Provider Notes (Signed)
Medical screening examination/treatment/procedure(s) were conducted as a shared visit with non-physician practitioner(s) and myself.  I personally evaluated the patient during the encounter  Pt with cocaine abuse and chest pain. No sob. Chest cta. uds pos cocaine. Initial cardiac markers normal.  Mirna Mires, MD 10/14/11 [phone]

## 2011-10-14 NOTE — Progress Notes (Signed)
Discharge Note:   S: patient's chest pain has resolved with toradol, hasn't had pain since yesterday afternoon. Denies SOB, nausea.  O:  Filed Vitals:   10/14/11 1010  BP: 137/84  Pulse:   Temp:   Resp:   General: pleasant woman resting in bed, in no distress HEENT: PERRL, EOMI, no scleral icterus Cardiac: RRR, no rubs, murmurs or gallops Pulm: clear to auscultation bilaterally, moving normal volumes of air Abd: soft, nontender, nondistended, BS present Ext: warm and well perfused, no pedal edema Neuro: alert and oriented X3, cranial nerves II-XII grossly intact  A/P: Will discharge patient to [facility] with short course of oral toradol.

## 2011-10-14 NOTE — Progress Notes (Signed)
Clinical Social Work-Full assessment in shadow chart-CSW completed/updated fl2 and received approval from [facility]. [place] providing transportation for pt to d/c. No further needs. Sign off. Gerre Scull, [phone]

## 2011-10-15 ENCOUNTER — Ambulatory Visit (INDEPENDENT_AMBULATORY_CARE_PROVIDER_SITE_OTHER): Payer: Medicaid Other | Admitting: Internal Medicine

## 2011-10-15 ENCOUNTER — Encounter: Payer: Self-pay | Admitting: Internal Medicine

## 2011-10-15 ENCOUNTER — Encounter: Payer: Medicaid Other | Admitting: Internal Medicine

## 2011-10-15 DIAGNOSIS — F1910 Other psychoactive substance abuse, uncomplicated: Secondary | ICD-10-CM

## 2011-10-15 DIAGNOSIS — N6320 Unspecified lump in the left breast, unspecified quadrant: Secondary | ICD-10-CM

## 2011-10-15 DIAGNOSIS — N63 Unspecified lump in unspecified breast: Secondary | ICD-10-CM

## 2011-10-15 DIAGNOSIS — L089 Local infection of the skin and subcutaneous tissue, unspecified: Secondary | ICD-10-CM

## 2011-10-15 DIAGNOSIS — I309 Acute pericarditis, unspecified: Secondary | ICD-10-CM

## 2011-10-15 DIAGNOSIS — E876 Hypokalemia: Secondary | ICD-10-CM
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
## 2011-10-15 NOTE — Assessment & Plan Note (Signed)
SOB and chest pain improved today per patient. Normal heart and lung exam on my exam today. Will continue her short course of toradol PRN.  Told her to not take ibuprofen concurrently with toradol.

## 2011-10-15 NOTE — Assessment & Plan Note (Signed)
L Breast mass seen on mammo last month.  [facility]our nurse called them today) to schedule follow-up imaging.  Patient says she missed calls bc she was in the hospital.  Patient says staff at her assisted living is going to call [facility] hospital tomorrow, and [facility] hospital is going to keep calling her.  [facility] will arrange the transportation when an appt is made.

## 2011-10-15 NOTE — Assessment & Plan Note (Signed)
K was good yesterday at discharge.  No vomiting or diarrhea since then.  Will not recheck at this time.  She could use a BMET at next follow-up in setting of her HTN therapy as well.

## 2011-10-15 NOTE — Assessment & Plan Note (Signed)
[name] reports no cocaine use in 4 days.  She wants to quit and is hopeful that this is the end of her cocaine use.  I strongly encouraged her to do so and explained all the risks of cocaine use for her (vasculitis, pericarditis, heart failure, heart attack, death).

## 2011-10-15 NOTE — Progress Notes (Signed)
Ms. Deegan history and physical examination reviewed with Dr. Orvilla Fus and I agree with the documented assessment and plan.

## 2011-10-22 ENCOUNTER — Encounter: Payer: Medicaid Other | Admitting: Internal Medicine

## 2011-10-27 ENCOUNTER — Ambulatory Visit
Admission: RE | Admit: 2011-10-27 | Discharge: 2011-10-27 | Disposition: A | Discharge status: Home / Self Care | Payer: Medicaid Other | Source: Ambulatory Visit | Attending: Family Medicine | Admitting: Family Medicine

## 2011-10-27 DIAGNOSIS — R928 Other abnormal and inconclusive findings on diagnostic imaging of breast: Secondary | ICD-10-CM

## 2011-11-02 NOTE — Assessment & Plan Note (Signed)
10/27/11 DIGITAL DIAGNOSTIC LEFT MAMMOGRAM   IMPRESSION: Negative, recommend routine screening mammogram back on schedule.  BI-RADS CATEGORY 1: Negative.

## 2011-11-04 IMAGING — CR DG CHEST 2V
2 series · 2 of 2 positions shown · non-contrast
Comparison: Chest 10/05/2009 and 05/07/2009.

CLINICAL DATA: Pain and arms and legs.  Cough.  Smoker.

CHEST - 2 VIEW

[w chest pa]
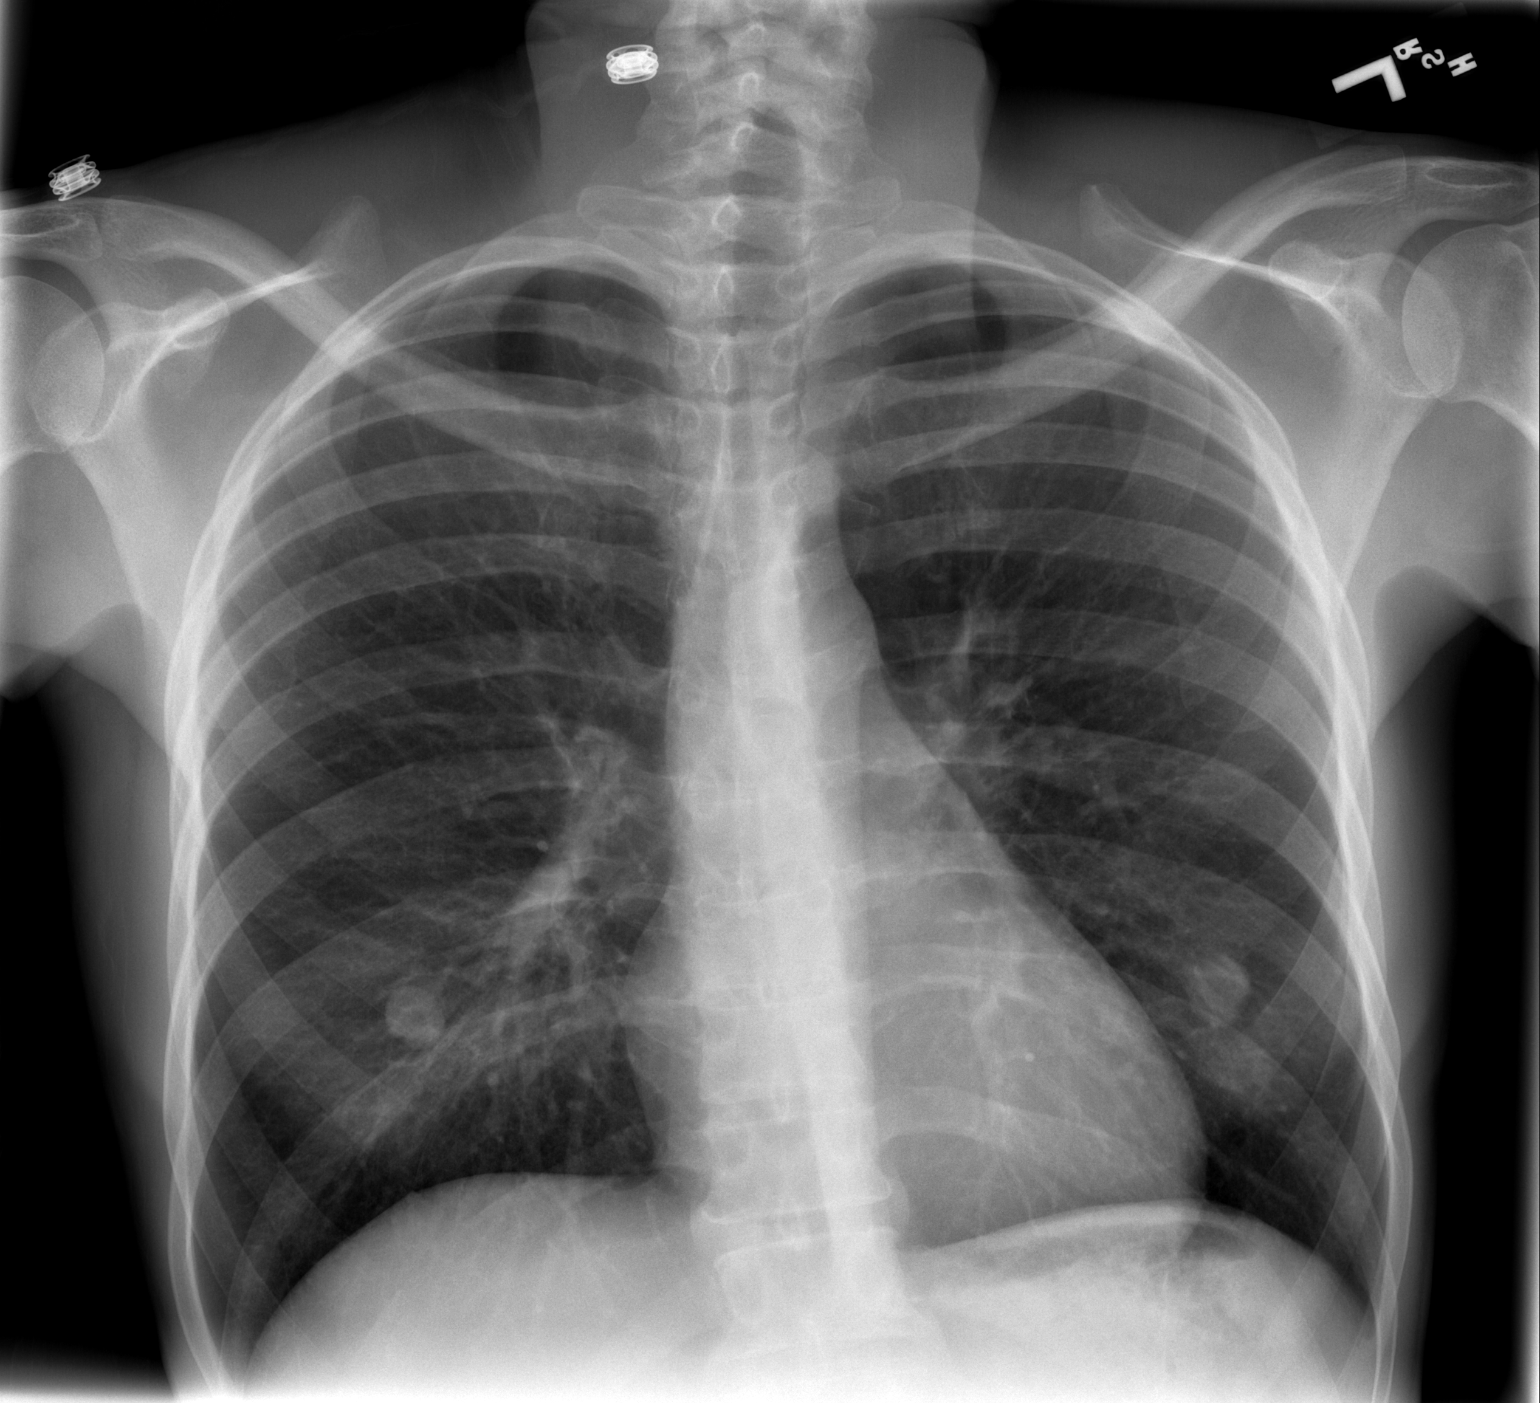

[w chest lat]
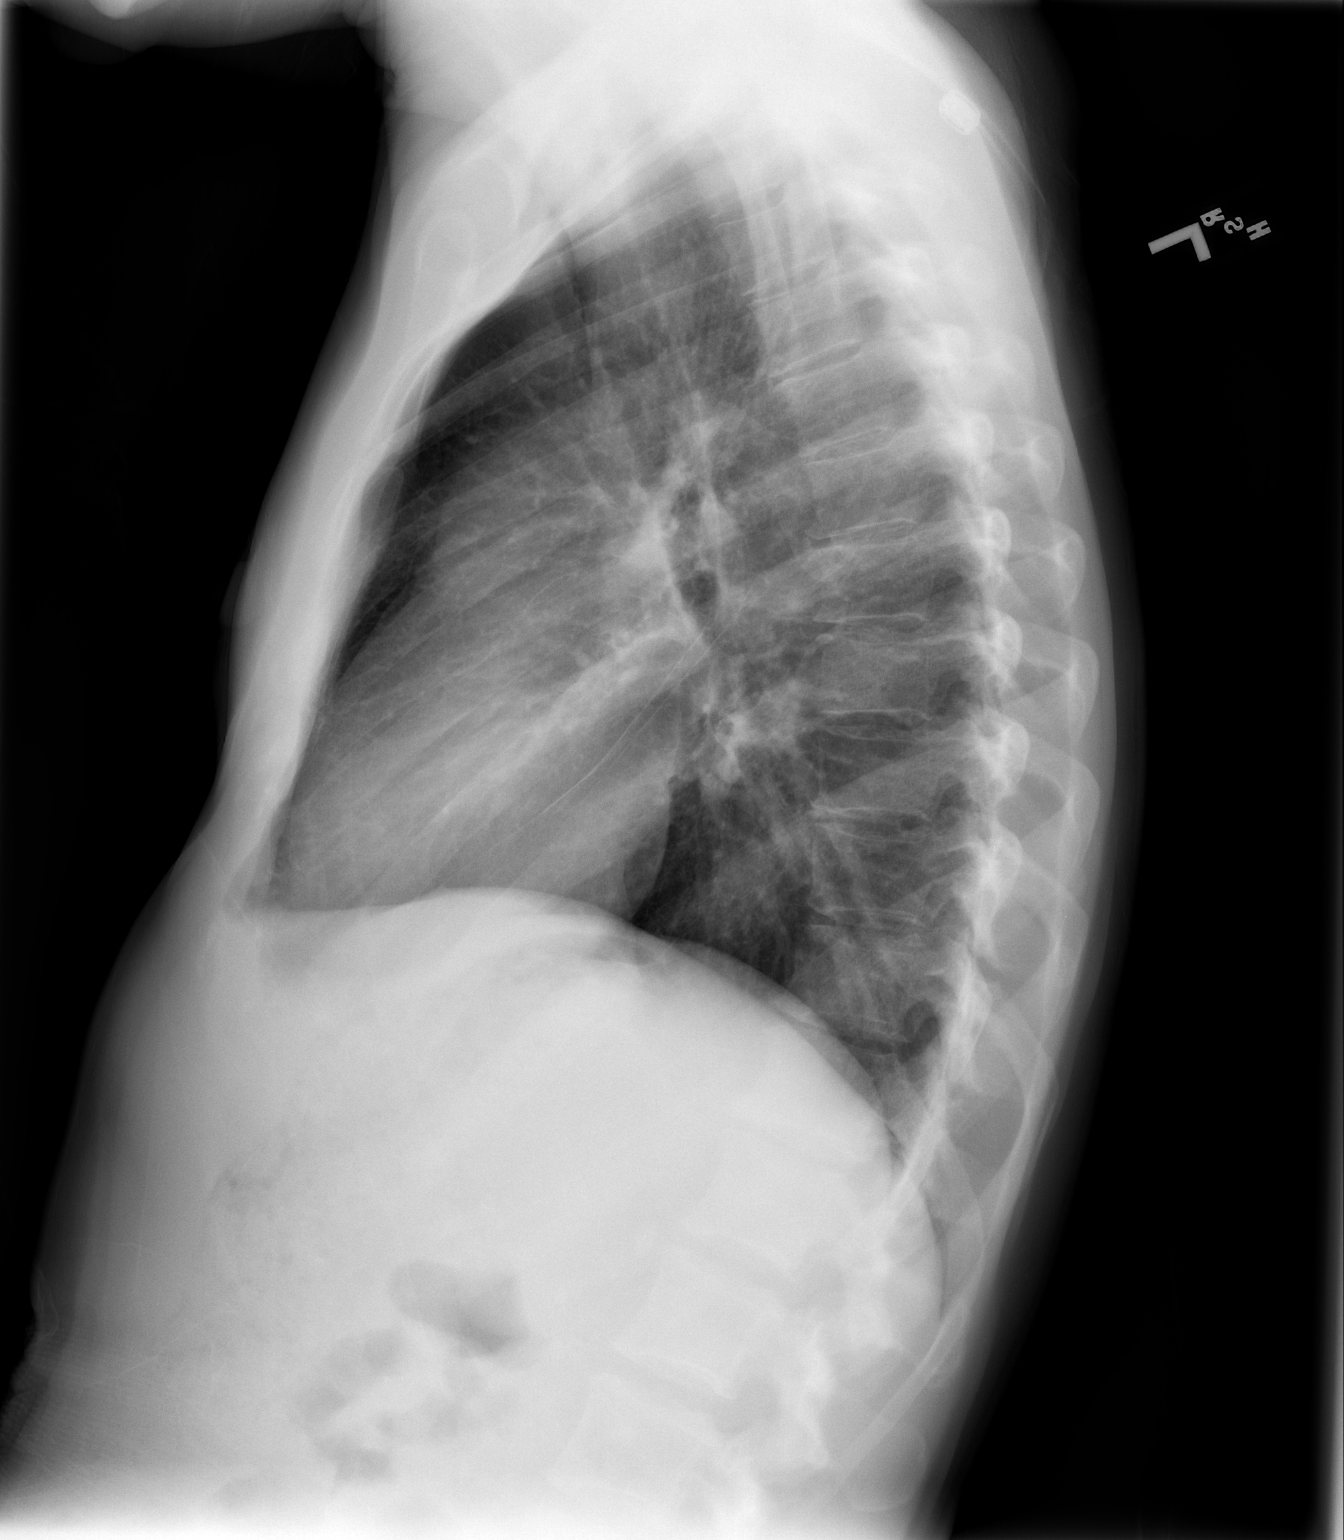

[2 of 2 positions shown; findings below may reference images not displayed]

FINDINGS: Prominent bilateral nipple shadows are unchanged.  The
lungs are clear.  No pleural effusion.  Heart size normal.  No
focal bony abnormality.
IMPRESSION: No acute disease.

## 2011-11-05 IMAGING — CR DG FOOT COMPLETE 3+V*R*
4 series · 4 of 4 positions shown · non-contrast
Comparison: None

CLINICAL DATA: Foot pain

RIGHT FOOT COMPLETE - 3+ VIEW

[t foot ap right]
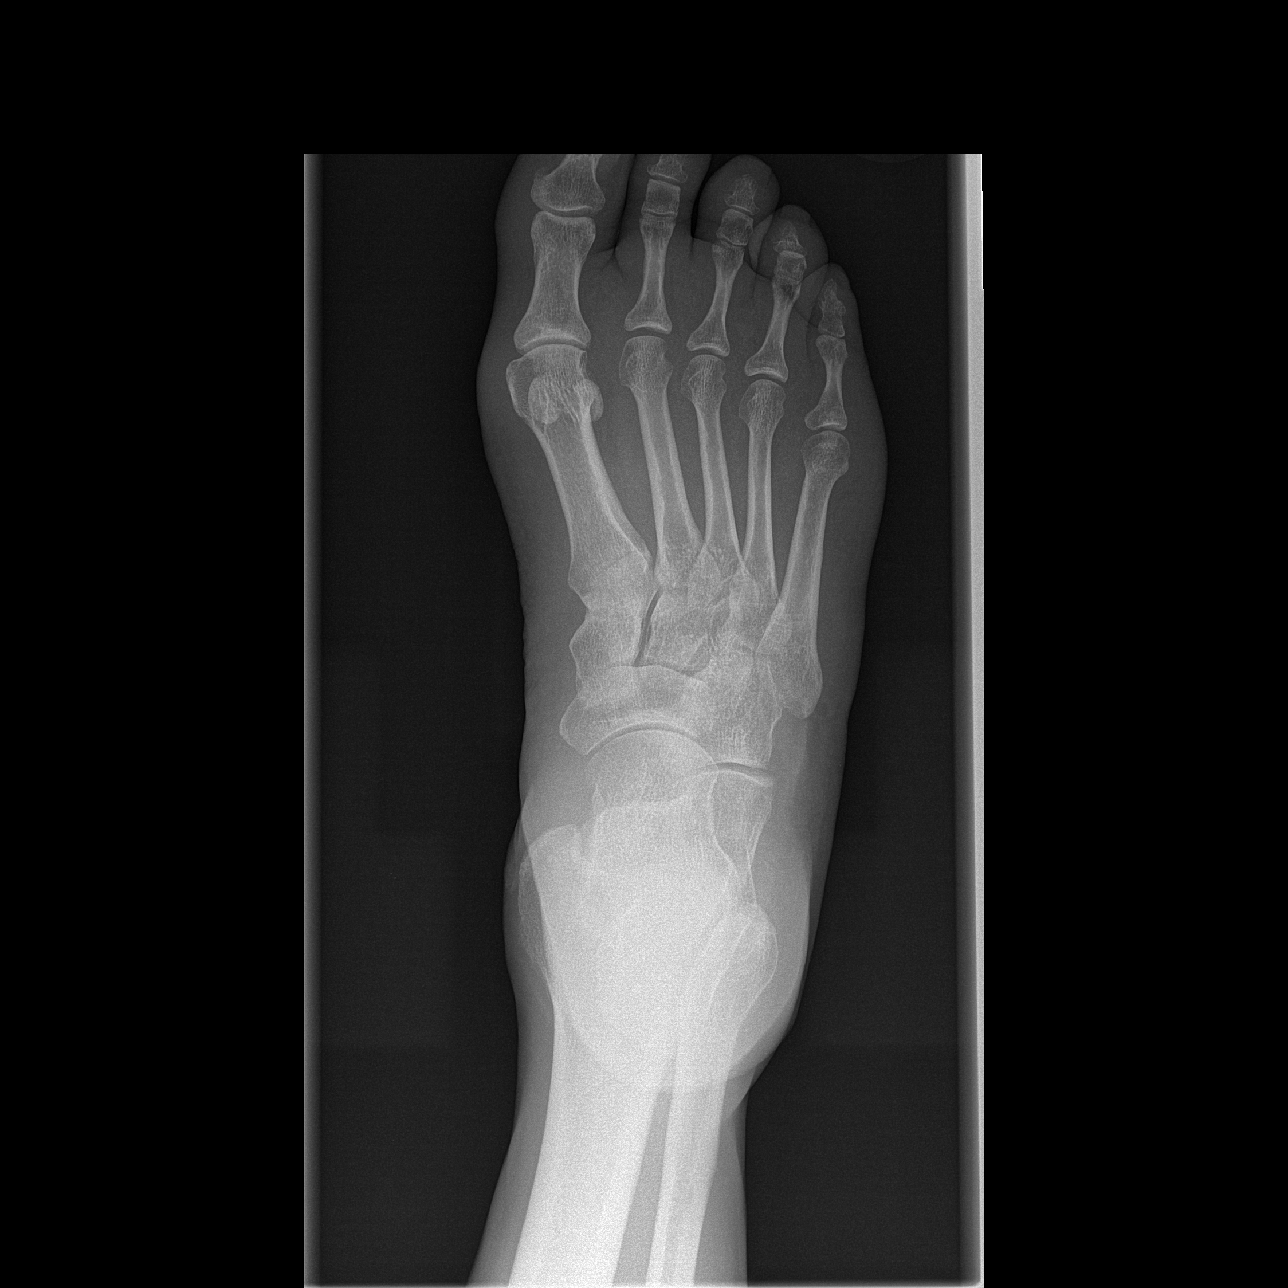

[t foot oblique right]
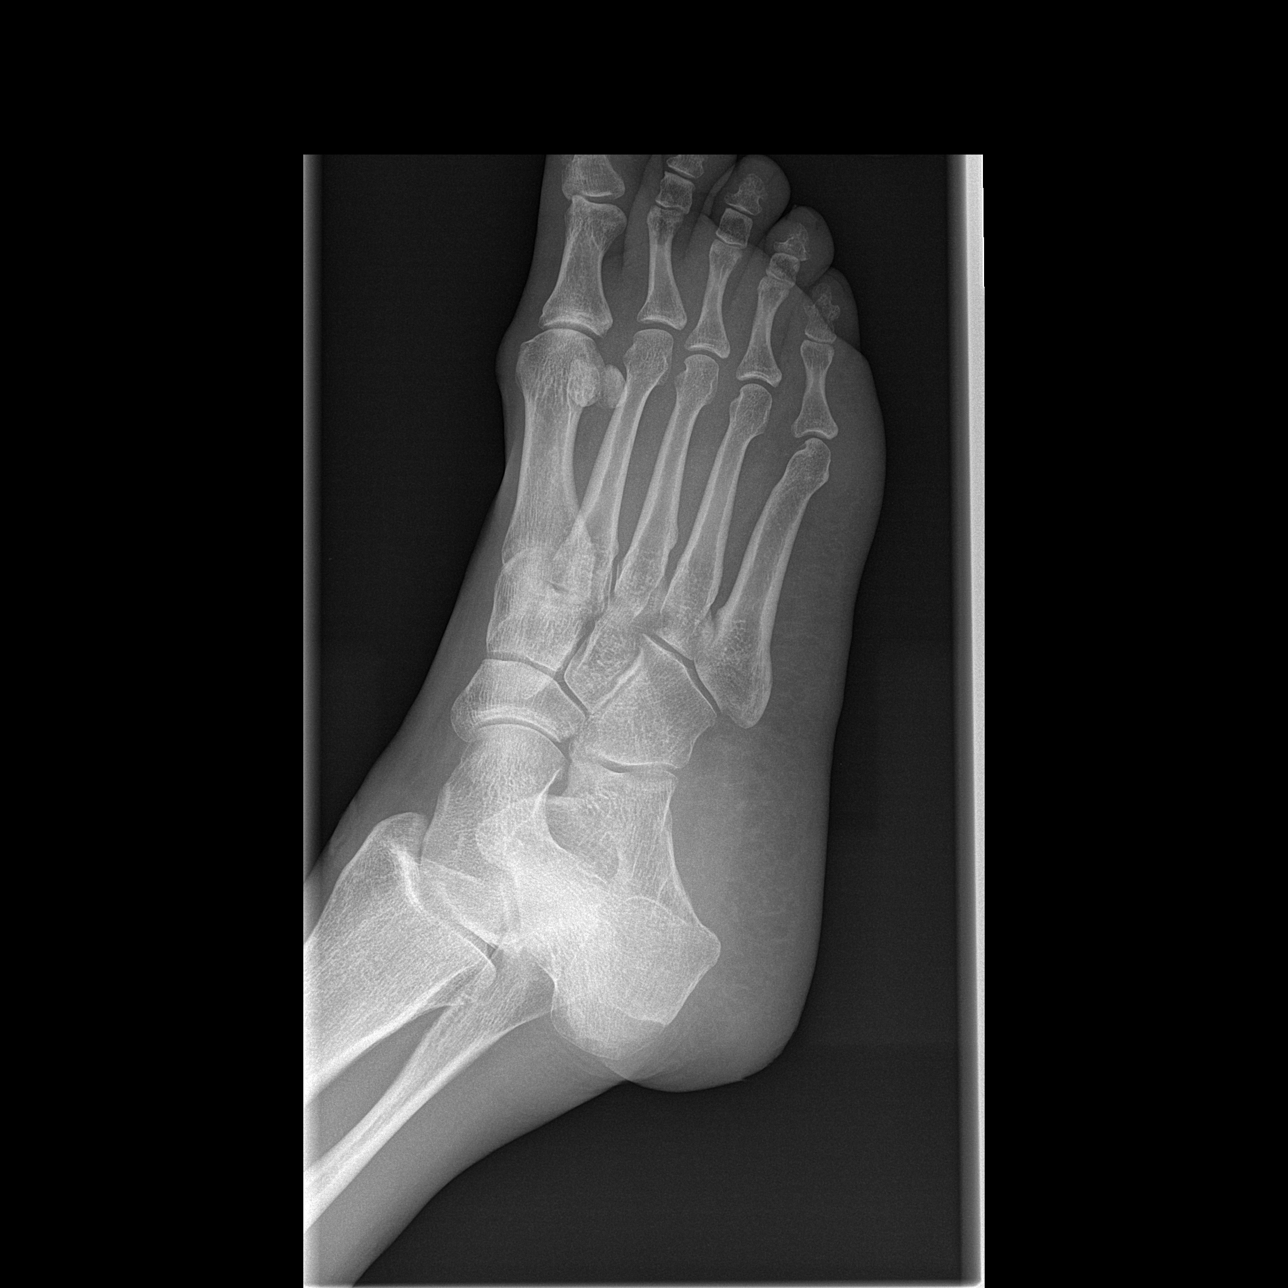

[t foot lat right (1 of 2)]
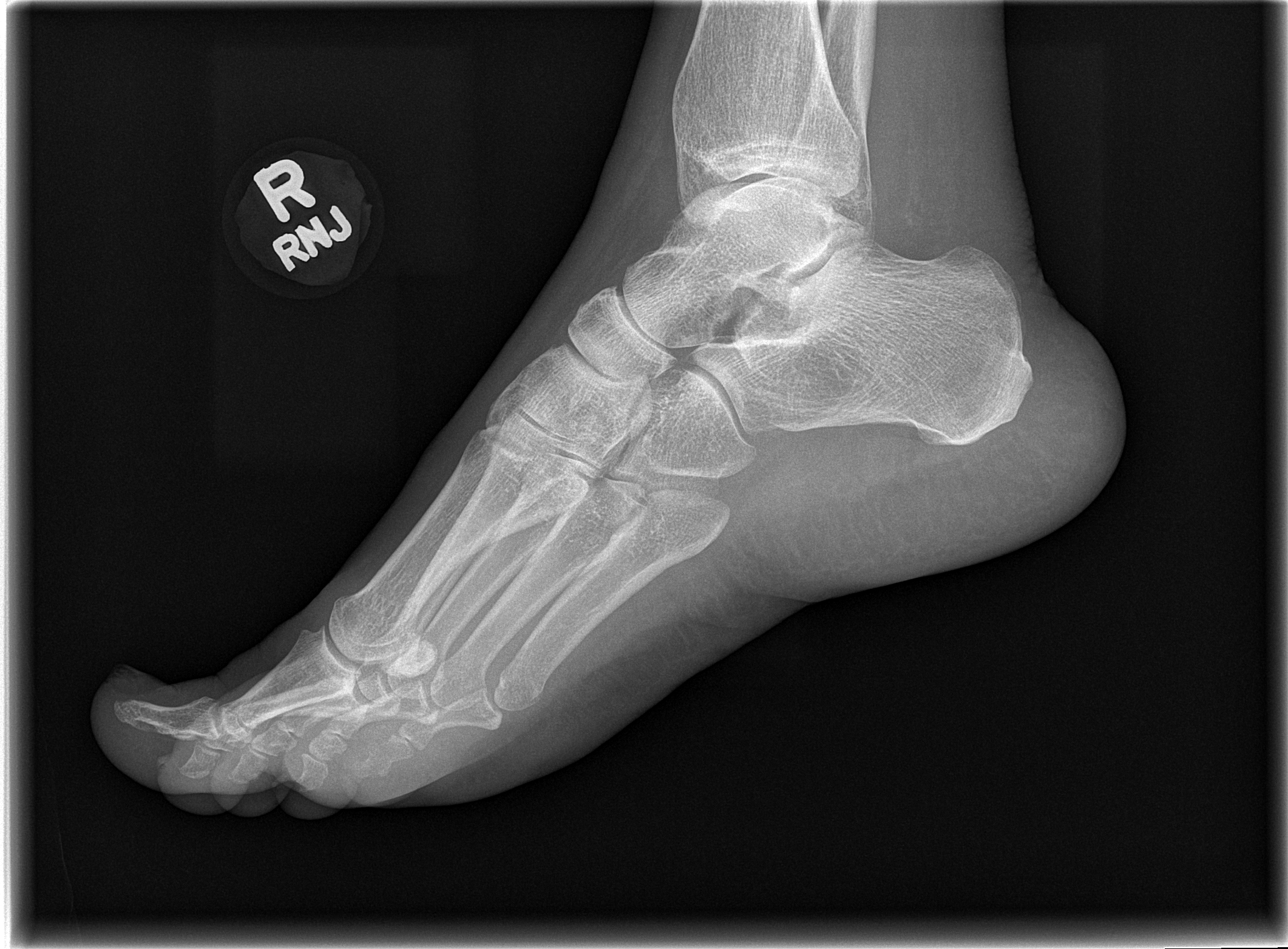

[t foot lat right (2 of 2)]
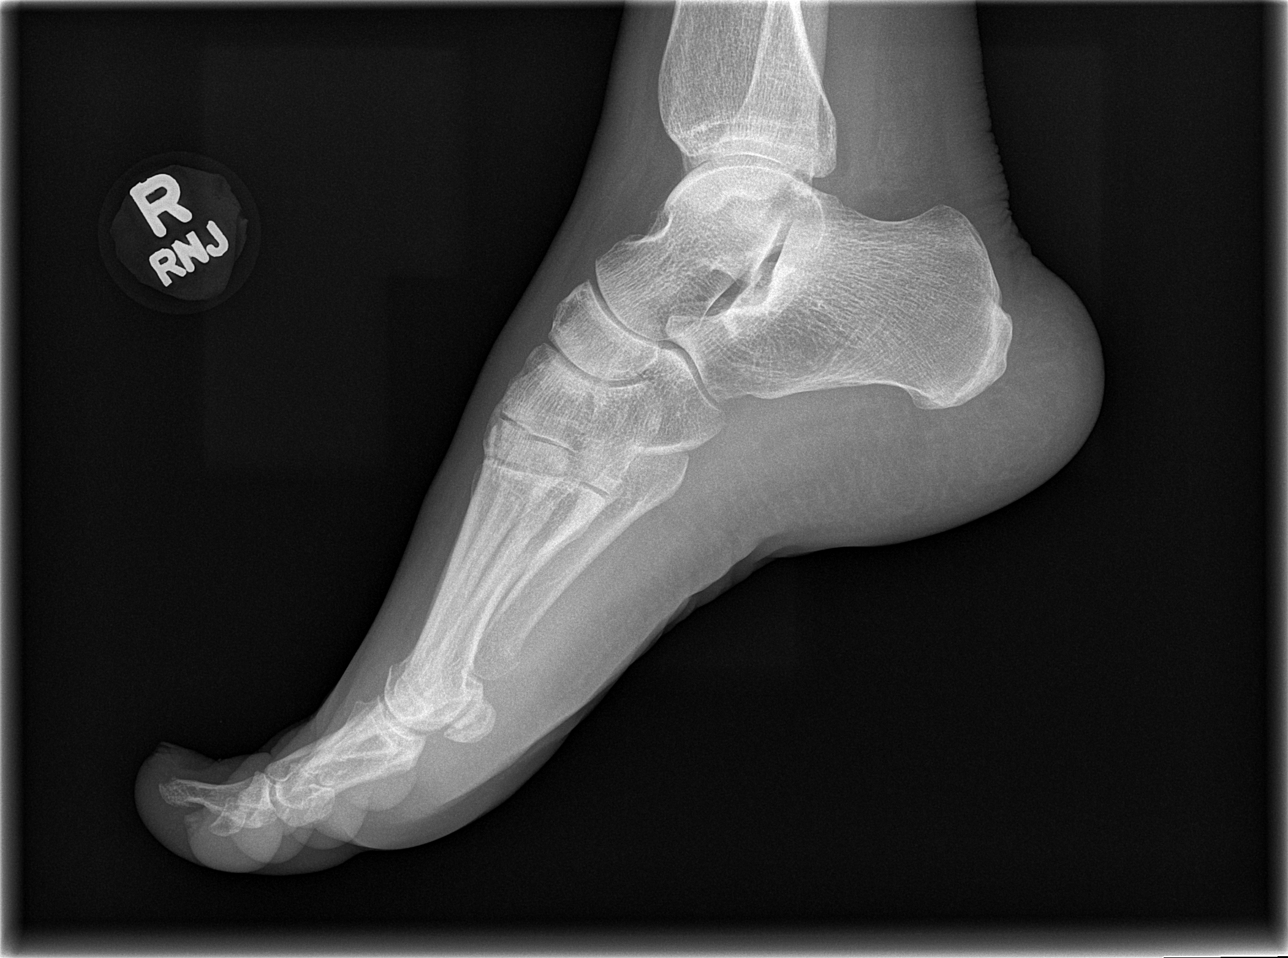

[4 of 4 positions shown; findings below may reference images not displayed]

FINDINGS: There is no evidence of fracture or dislocation.  There
is no evidence of arthropathy or other focal bone abnormality.
Soft tissues are unremarkable.
IMPRESSION: No acute bony abnormalities.

## 2011-12-03 ENCOUNTER — Emergency Department (HOSPITAL_COMMUNITY)
Admission: EM | Admit: 2011-12-03 | Discharge: 2011-12-04 | Disposition: A | Discharge status: Home / Self Care | Payer: Medicaid Other | Attending: Emergency Medicine | Admitting: Emergency Medicine

## 2011-12-03 ENCOUNTER — Emergency Department (HOSPITAL_COMMUNITY): Payer: Medicaid Other

## 2011-12-03 ENCOUNTER — Encounter (HOSPITAL_COMMUNITY): Payer: Self-pay | Admitting: Emergency Medicine

## 2011-12-03 ENCOUNTER — Other Ambulatory Visit: Payer: Self-pay

## 2011-12-03 DIAGNOSIS — F17200 Nicotine dependence, unspecified, uncomplicated: Secondary | ICD-10-CM | POA: Insufficient documentation

## 2011-12-03 DIAGNOSIS — R209 Unspecified disturbances of skin sensation: Secondary | ICD-10-CM | POA: Insufficient documentation

## 2011-12-03 DIAGNOSIS — I776 Arteritis, unspecified: Secondary | ICD-10-CM | POA: Insufficient documentation

## 2011-12-03 DIAGNOSIS — F1410 Cocaine abuse, uncomplicated: Secondary | ICD-10-CM | POA: Insufficient documentation

## 2011-12-03 DIAGNOSIS — F3289 Other specified depressive episodes: Secondary | ICD-10-CM | POA: Insufficient documentation

## 2011-12-03 DIAGNOSIS — Z79899 Other long term (current) drug therapy: Secondary | ICD-10-CM | POA: Insufficient documentation

## 2011-12-03 DIAGNOSIS — I10 Essential (primary) hypertension: Secondary | ICD-10-CM | POA: Insufficient documentation

## 2011-12-03 DIAGNOSIS — R079 Chest pain, unspecified: Secondary | ICD-10-CM | POA: Insufficient documentation

## 2011-12-03 DIAGNOSIS — F329 Major depressive disorder, single episode, unspecified: Secondary | ICD-10-CM | POA: Insufficient documentation

## 2011-12-03 LAB — BASIC METABOLIC PANEL
CO2: 25 mEq/L (ref 19–32)
Calcium: 9.8 mg/dL (ref 8.4–10.5)
Chloride: 104 mEq/L (ref 96–112)
Creatinine, Ser: 0.73 mg/dL (ref 0.50–1.10)
Glucose, Bld: 102 mg/dL — ABNORMAL HIGH (ref 70–99)
Sodium: 139 mEq/L (ref 135–145)

## 2011-12-03 LAB — DIFFERENTIAL
Eosinophils Relative: 1 % (ref 0–5)
Lymphocytes Relative: 32 % (ref 12–46)
Lymphs Abs: 1.3 10*3/uL (ref 0.7–4.0)
Monocytes Absolute: 0.2 10*3/uL (ref 0.1–1.0)
Neutro Abs: 2.5 10*3/uL (ref 1.7–7.7)

## 2011-12-03 LAB — POCT I-STAT TROPONIN I

## 2011-12-03 LAB — CBC
HCT: 33.5 % — ABNORMAL LOW (ref 36.0–46.0)
MCV: 81.3 fL (ref 78.0–100.0)
RBC: 4.12 MIL/uL (ref 3.87–5.11)
WBC: 4.1 10*3/uL (ref 4.0–10.5)

## 2011-12-03 LAB — GLUCOSE, CAPILLARY: Glucose-Capillary: 99 mg/dL (ref 70–99)

## 2011-12-03 MED ORDER — ASPIRIN 81 MG PO CHEW
324.0000 mg | CHEWABLE_TABLET | Freq: Once | ORAL | Status: AC
  Administered 2011-12-03: 324 mg via ORAL
  Filled 2011-12-03: qty 4

## 2011-12-03 MED ORDER — FENTANYL CITRATE 0.05 MG/ML IJ SOLN
50.0000 ug | Freq: Once | INTRAMUSCULAR | Status: AC
  Administered 2011-12-03: 50 ug via INTRAVENOUS
  Filled 2011-12-03: qty 2
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
## 2011-12-04 MED ORDER — PREDNISONE (PAK) 10 MG PO TABS: 10.0000 mg | ORAL_TABLET | Freq: Every day | ORAL | Status: AC

## 2011-12-04 MED ORDER — PREDNISONE 20 MG PO TABS
60.0000 mg | ORAL_TABLET | Freq: Once | ORAL | Status: AC
  Administered 2011-12-04: 60 mg via ORAL
  Filled 2011-12-04: qty 3

## 2011-12-04 NOTE — Discharge Instructions (Signed)
Take prednisone as directed. Outpatient Clinics should be calling in the morning to give you an appointment time to see them in the office tomorrow but if you do not hear from the office by 11 AM tomorrow then call the outpatient clinics to verify appointment time tomorrow however return to emergency department for any emergent changing or worsening symptoms. It is very, very, very important for you to stop smoking crack of pain.  Cocaine Abuse PROBLEMS FROM USING COCAINE:   Highly addictive.   Illegal.   Risk of sudden death.   Heart disease.   Irregular heart beat.   High blood pressure.   Damage to nose and lungs.   Severe agitation.   Hallucinations.   Violent behavior.   Paranoia.   Sexual dysfunction.  Most cocaine users deny that they have a problem with addiction. The biggest problem is admitting that you are dependent on cocaine. Those trying to quit using it may experience depression and withdrawal symptoms. Other withdrawal symptoms include fatigue, suicidal thoughts, sleepiness, restlessness, anxiety, and increased craving for cocaine. There are medications available to help prevent depression associated with stopping cocaine. Most users will find a support group or treatment program helpful in coming off and staying off cocaine. The best chance to cure cocaine addiction is to go into group therapy and to be in a drug-free environment. It is very important to develop healthy relationships and avoid socializing with people who use or deal drugs. Eat well, and give your body the proper rest and healthy exercise it needs. You may need medication to help treat withdrawal symptoms. Call your caregiver or a drug treatment center for more help.  You may also want to call the [facility] at 800-662-HELP in the [place] IF:  You develop severe chest pain.   You develop shortness of breath.   You develop extreme agitation.    Document Released: 10/29/2004 Document Revised: 06/03/2011 Document Reviewed: 07/24/2009 [facility] Patient Information 2012 [place].

## 2011-12-04 NOTE — ED Provider Notes (Signed)
Medical screening examination/treatment/procedure(s) were performed by non-physician practitioner and as supervising physician I was immediately available for consultation/collaboration.  Jasper Riling. Alvino Chapel, MD 12/04/11 [phone]

## 2012-01-05 ENCOUNTER — Emergency Department (HOSPITAL_COMMUNITY)
Admission: EM | Admit: 2012-01-05 | Discharge: 2012-01-06 | Disposition: A | Discharge status: Home / Self Care | Payer: Medicaid Other | Attending: Emergency Medicine | Admitting: Emergency Medicine

## 2012-01-05 ENCOUNTER — Encounter (HOSPITAL_COMMUNITY): Payer: Self-pay | Admitting: Emergency Medicine

## 2012-01-05 DIAGNOSIS — M25549 Pain in joints of unspecified hand: Secondary | ICD-10-CM | POA: Insufficient documentation

## 2012-01-05 DIAGNOSIS — M2550 Pain in unspecified joint: Secondary | ICD-10-CM

## 2012-01-05 DIAGNOSIS — I10 Essential (primary) hypertension: Secondary | ICD-10-CM | POA: Insufficient documentation

## 2012-01-05 DIAGNOSIS — F17200 Nicotine dependence, unspecified, uncomplicated: Secondary | ICD-10-CM | POA: Insufficient documentation

## 2012-01-05 NOTE — ED Notes (Signed)
PT. REPORTS RIGHT HAND PAIN WITH SWELLING ONSET TODAY WITH RIGHT FOOT PAIN , DENIES INJURY OR FALL , AMBULATORY.

## 2012-01-06 MED ORDER — IBUPROFEN 200 MG PO TABS
400.0000 mg | ORAL_TABLET | Freq: Once | ORAL | Status: DC
  Filled 2012-01-06: qty 3

## 2012-01-06 MED ORDER — PREDNISONE 20 MG PO TABS: 40.0000 mg | ORAL_TABLET | Freq: Every day | ORAL | Status: AC

## 2012-01-06 MED ORDER — PREDNISONE 20 MG PO TABS
60.0000 mg | ORAL_TABLET | Freq: Once | ORAL | Status: AC
  Administered 2012-01-06: 60 mg via ORAL
  Filled 2012-01-06: qty 3

## 2012-01-06 MED ORDER — OXYCODONE HCL 5 MG PO TABS: 5.0000 mg | ORAL_TABLET | Freq: Four times a day (QID) | ORAL | Status: AC | PRN

## 2012-01-06 MED ORDER — MORPHINE SULFATE 4 MG/ML IJ SOLN
4.0000 mg | Freq: Once | INTRAMUSCULAR | Status: AC
  Administered 2012-01-06: 4 mg via INTRAMUSCULAR
  Filled 2012-01-06: qty 1

## 2012-01-06 MED ORDER — IBUPROFEN 800 MG PO TABS
800.0000 mg | ORAL_TABLET | Freq: Once | ORAL | Status: AC
  Administered 2012-01-06: 800 mg via ORAL
  Filled 2012-01-06: qty 1

## 2012-01-06 MED ORDER — NAPROXEN 500 MG PO TABS: 500.0000 mg | ORAL_TABLET | Freq: Two times a day (BID) | ORAL | Status: DC | PRN

## 2012-01-06 NOTE — ED Notes (Signed)
Pt discharged in stable condition with family member. Pt refuses wheelchair. Pt escorted to lobby where she states she will wait until the bus begin again in the morning. Discharge instructions including medication dosage and precautions discussed with pt. Questions answered, pt denies any further question at this time.

## 2012-01-06 NOTE — ED Notes (Signed)
Pt complaines of Rt hand pain and "my Lt hand hurts now too, and my feet burn". Pt has swelling, redness, and tenderness to 1st knuckles of index, middle, and ring fingers of Rt hand and to ulna wrist joint. Per pt this started this morning.

## 2012-01-06 NOTE — Discharge Instructions (Signed)
Arthralgia Your caregiver has diagnosed you as suffering from an arthralgia. Arthralgia means there is pain in a joint. This can come from many reasons including:  Bruising the joint which causes soreness (inflammation) in the joint.   Wear and tear on the joints which occur as we grow older (osteoarthritis).   Overusing the joint.   Various forms of arthritis.   Infections of the joint.  Regardless of the cause of pain in your joint, most of these different pains respond to anti-inflammatory drugs and rest. The exception to this is when a joint is infected, and these cases are treated with antibiotics, if it is a bacterial infection. HOME CARE INSTRUCTIONS   Rest the injured area for as long as directed by your caregiver. Then slowly start using the joint as directed by your caregiver and as the pain allows. Crutches as directed may be useful if the ankles, knees or hips are involved. If the knee was splinted or casted, continue use and care as directed. If an stretchy or elastic wrapping bandage has been applied today, it should be removed and re-applied every 3 to 4 hours. It should not be applied tightly, but firmly enough to keep swelling down. Watch toes and feet for swelling, bluish discoloration, coldness, numbness or excessive pain. If any of these problems (symptoms) occur, remove the ace bandage and re-apply more loosely. If these symptoms persist, contact your caregiver or return to this location.   For the first 24 hours, keep the injured extremity elevated on pillows while lying down.   Apply ice for 15 to 20 minutes to the sore joint every couple hours while awake for the first half day. Then 3 to 4 times per day for the first 48 hours. Put the ice in a plastic bag and place a towel between the bag of ice and your skin.   Wear any splinting, casting, elastic bandage applications, or slings as instructed.   Only take over-the-counter or prescription medicines for pain,  discomfort, or fever as directed by your caregiver. Do not use aspirin immediately after the injury unless instructed by your physician. Aspirin can cause increased bleeding and bruising of the tissues.   If you were given crutches, continue to use them as instructed and do not resume weight bearing on the sore joint until instructed.  Persistent pain and inability to use the sore joint as directed for more than 2 to 3 days are warning signs indicating that you should see a caregiver for a follow-up visit as soon as possible. Initially, a hairline fracture (break in bone) may not be evident on X-rays. Persistent pain and swelling indicate that further evaluation, non-weight bearing or use of the joint (use of crutches or slings as instructed), or further X-rays are indicated. X-rays may sometimes not show a small fracture until a week or 10 days later. Make a follow-up appointment with your own caregiver or one to whom we have referred you. A radiologist (specialist in reading X-rays) may read your X-rays. Make sure you know how you are to obtain your X-ray results. Do not assume everything is normal if you do not hear from [place]. SEEK MEDICAL CARE IF: Bruising, swelling, or pain increases. SEEK IMMEDIATE MEDICAL CARE IF:   Your fingers or toes are numb or blue.   The pain is not responding to medications and continues to stay the same or get worse.   The pain in your joint becomes severe.   You develop a fever over  102 F (38.9 C).   It becomes impossible to move or use the joint.  MAKE SURE YOU:   Understand these instructions.   Will watch your condition.   Will get help right away if you are not doing well or get worse.  Document Released: 09/21/2005 Document Revised: 09/10/2011 Document Reviewed: 05/09/2008 [facility] Patient Information 2012 [place].  RESOURCE GUIDE  Dental Problems  Patients with Medicaid: [facility] Phone:  [phone]                                                  Phone:  [phone]  If unable to pay or uninsured, contact:  [facility] or [facility]. to become qualified for the adult dental clinic.  Chronic Pain Problems Contact [facility]  [phone] Patients need to be referred by their primary care doctor.  Insufficient Money for Medicine Contact [employer]:  call "211" or [place] [phone].  No Primary Care Doctor Call [facility]  [phone] Other agencies that provide inexpensive medical care    [place]  [phone]    [facility]  [place]  [phone]    [facility]  [phone]    [facility]  [place]  [place]  [phone] [place]   [phone] (emergency services [phone])  [facility]  [phone] [facility] [phone] [place] [phone] [place] [phone] Residential & Outpatient Substance Abuse Program  [phone]  Abuse/Neglect [place] [phone] [place] [phone] (After Hours)  Emergency [place] [phone]  [place] [facility] [place] [phone] [place] [phone]  MRSA Hotline #:   [phone]    [facility] Dept. [address]      [place] Phone:  [phone]  Phone:  [phone]                 Phone:  [place] Phone:  [place] [phone] (After Hours)

## 2012-01-11 ENCOUNTER — Encounter: Payer: Medicaid Other | Admitting: Internal Medicine

## 2012-01-14 IMAGING — CR DG ANKLE COMPLETE 3+V*L*
3 series · 3 of 3 positions shown · non-contrast
Comparison: None.

CLINICAL DATA: Gangrene, sepsis, pain

LEFT ANKLE COMPLETE - 3+ VIEW

[t ankle joint ap left]
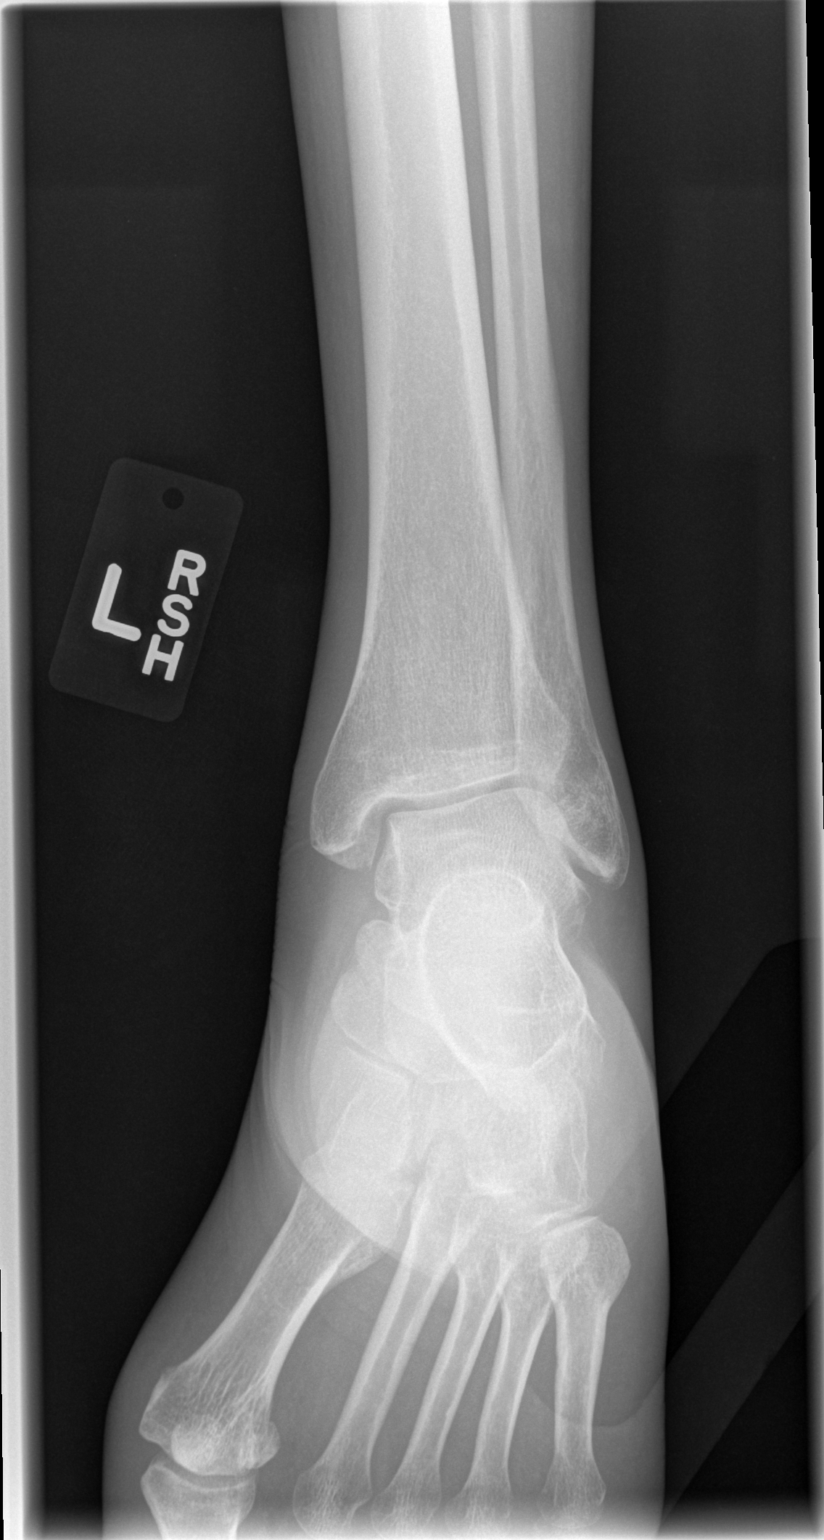

[t ankle joint oblique left]
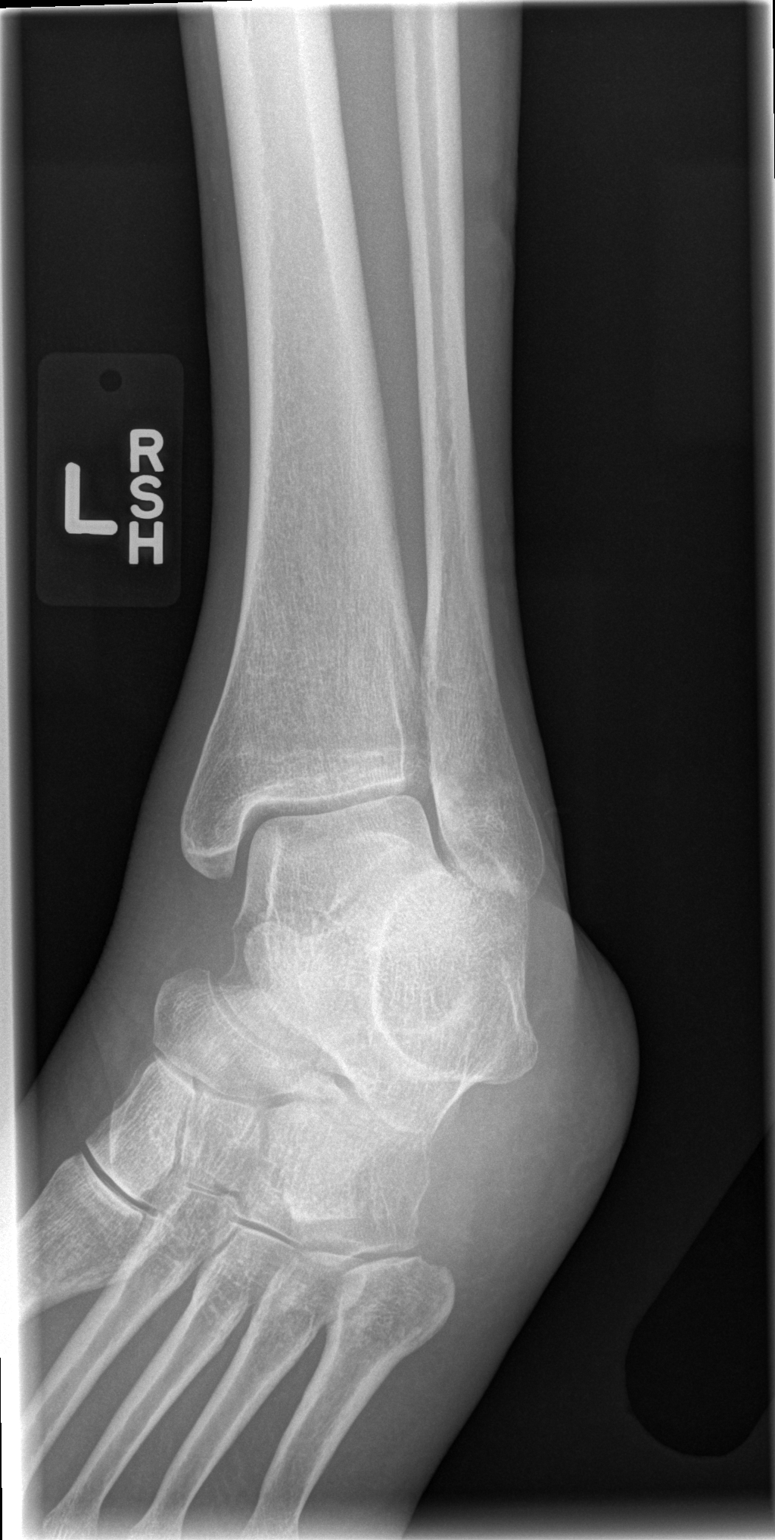

[t ankle joint lat left]
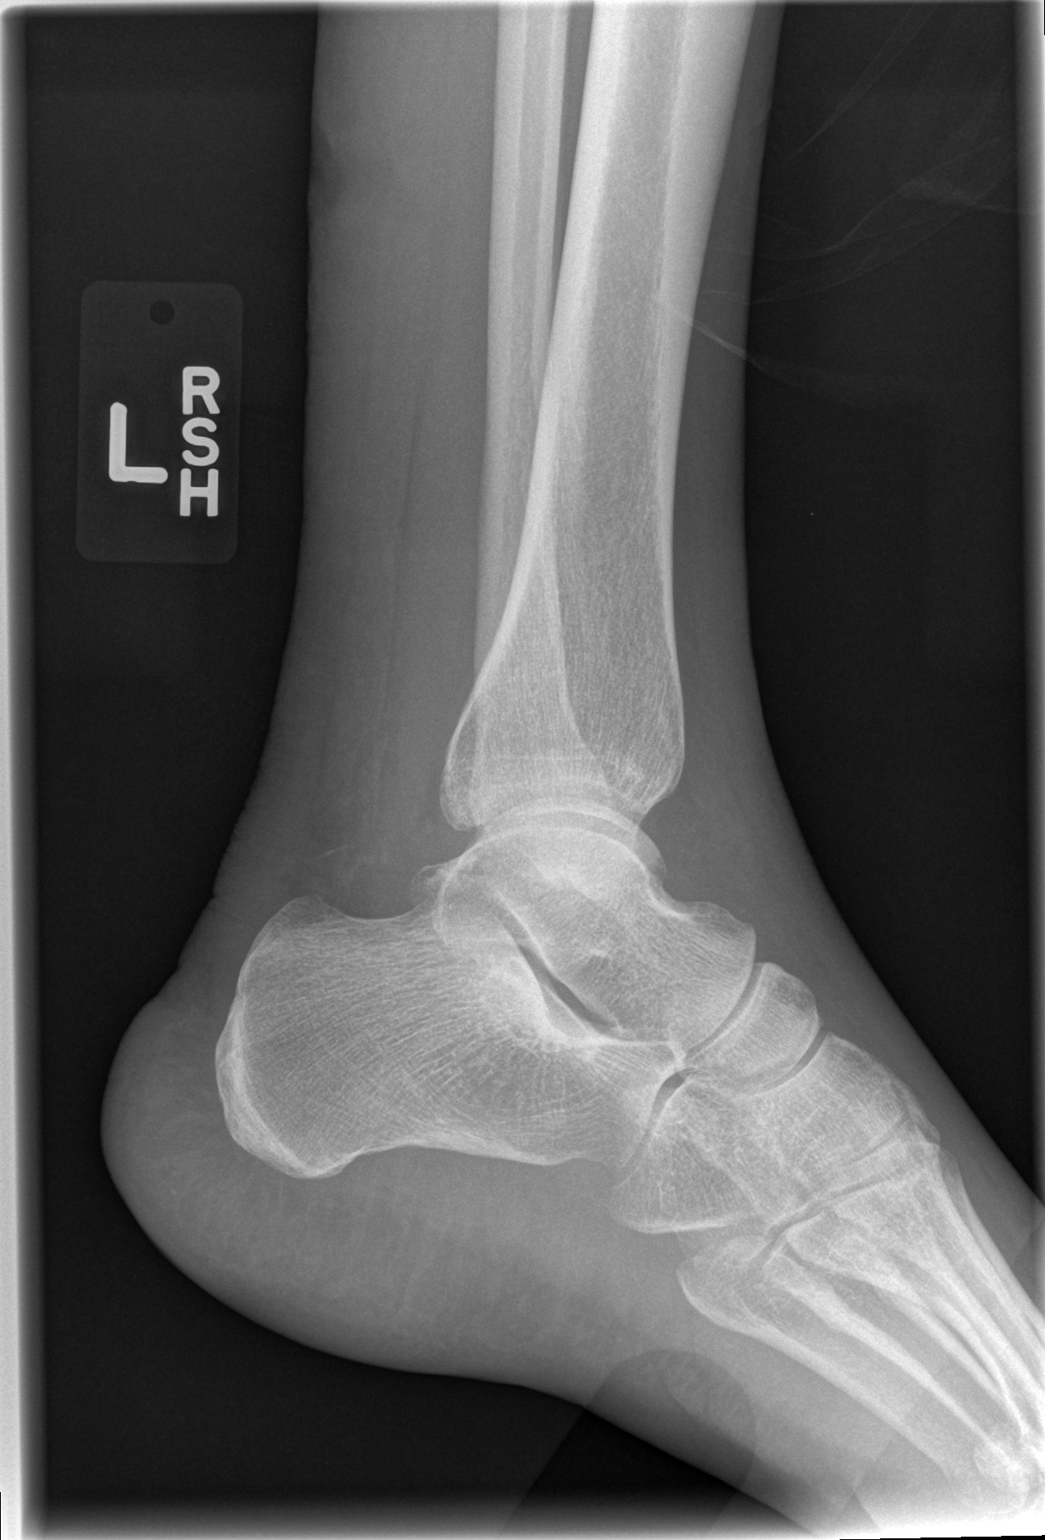

[3 of 3 positions shown; findings below may reference images not displayed]

FINDINGS: No radiographic evidence of osteomyelitis is seen.  The
ankle joint appears normal.  Alignment is normal.
IMPRESSION: No evidence of osteomyelitis.

## 2012-01-14 IMAGING — US US ABDOMEN COMPLETE
1 series · 14 of 25 positions shown · non-contrast
Comparison: CT abdomen pelvis of 05/31/2009

CLINICAL DATA: Lower extremity gangrene, pain

COMPLETE ABDOMINAL ULTRASOUND

[Series 1: us abdomen complete · 0.23mm/px · 14 of 69 slices shown]
[im 1/69]
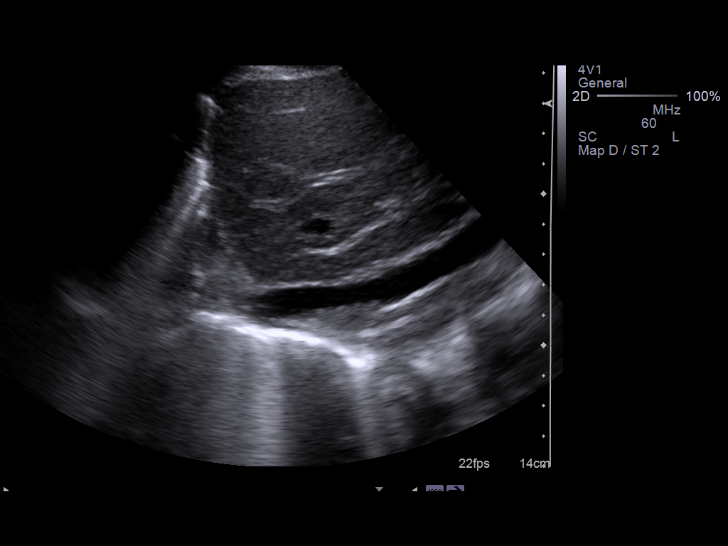
[im 6/69]
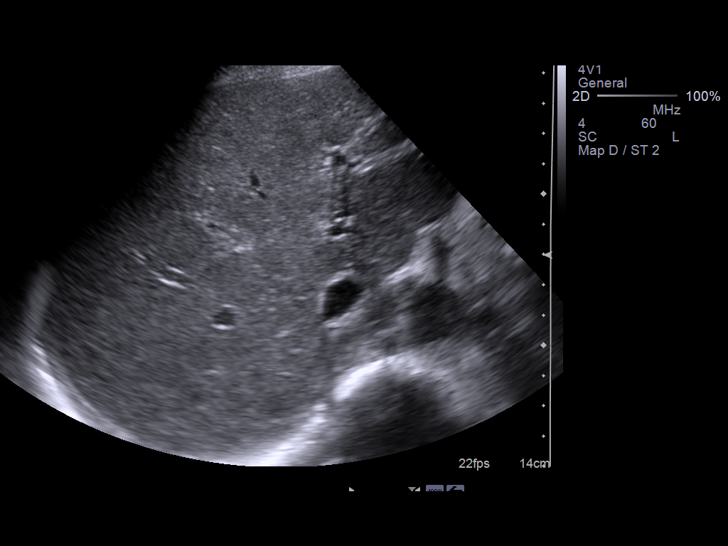
[im 12/69]
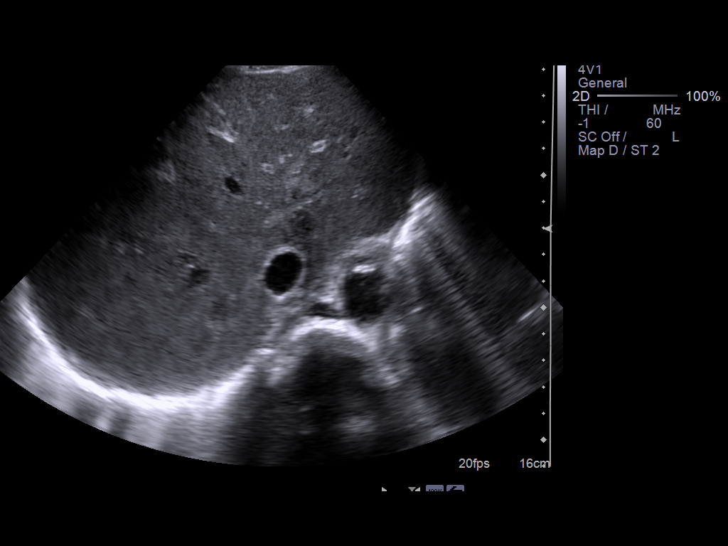
[im 18/69]
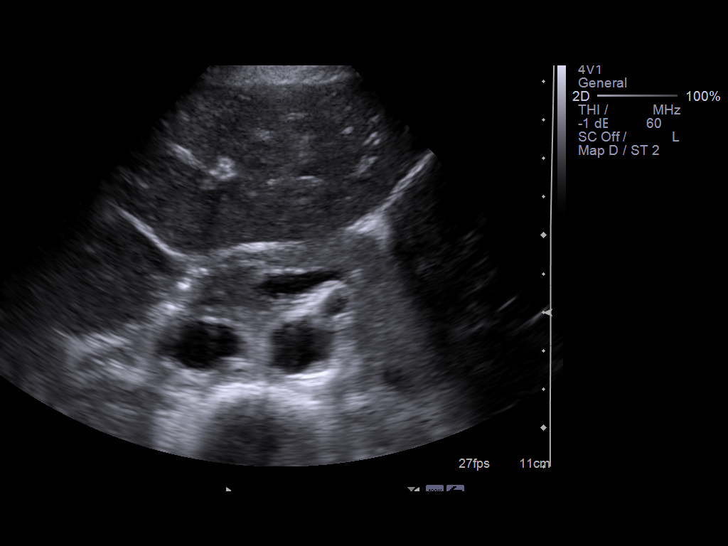
[im 23/69]
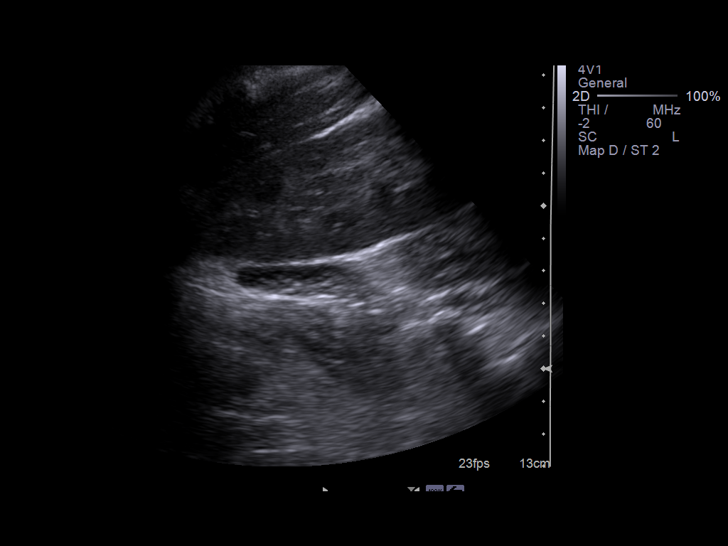
[im 26/69]
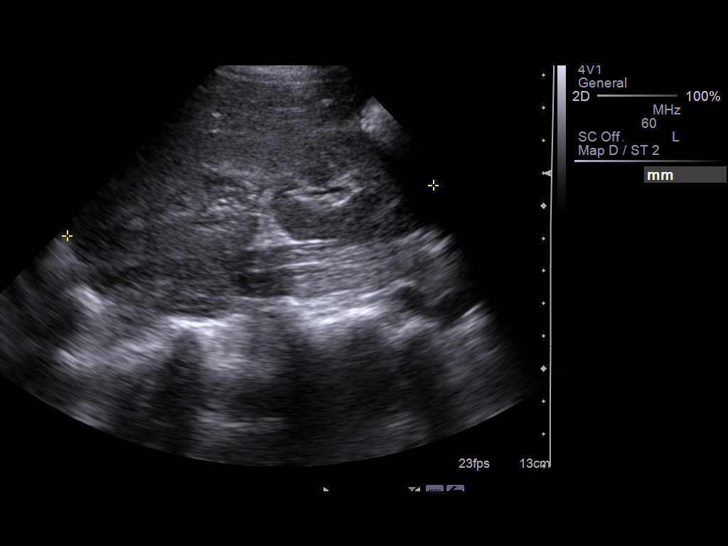
[im 32/69]
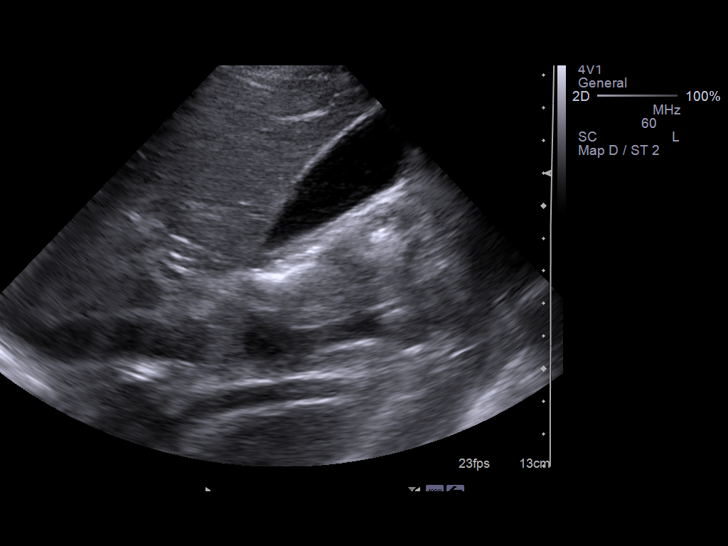
[im 37/69]
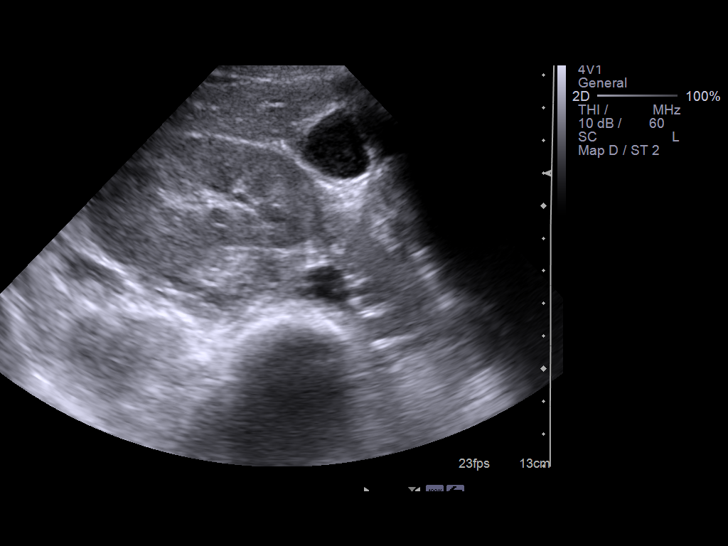
[im 43/69]
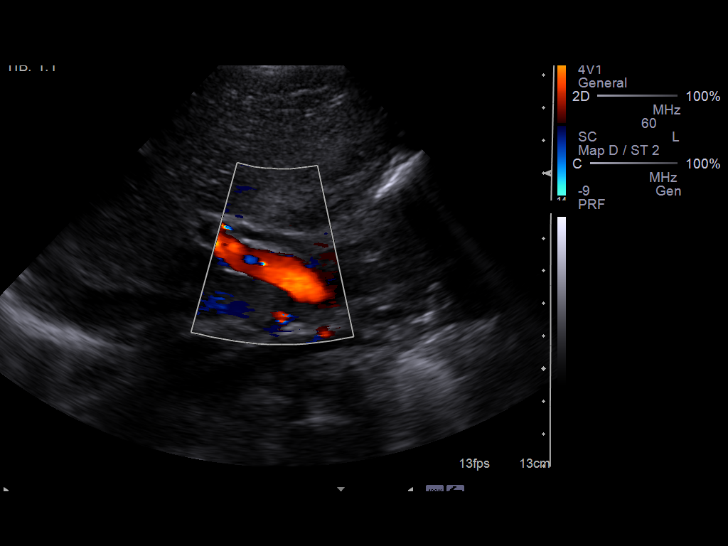
[im 46/69]
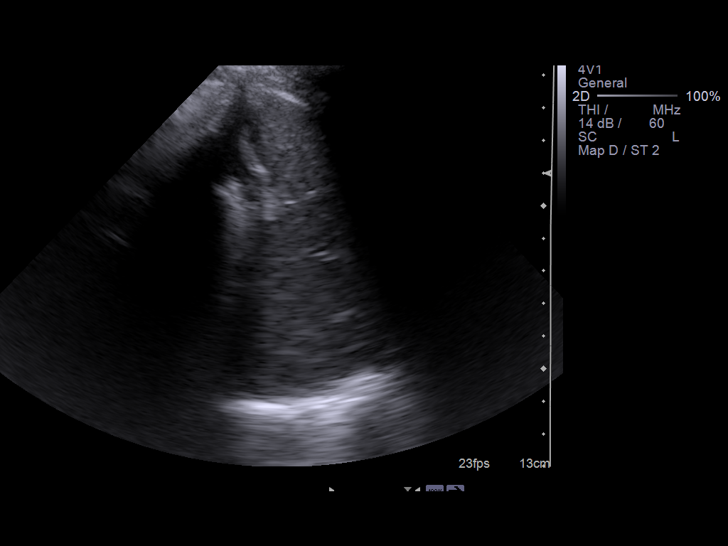
[im 52/69]
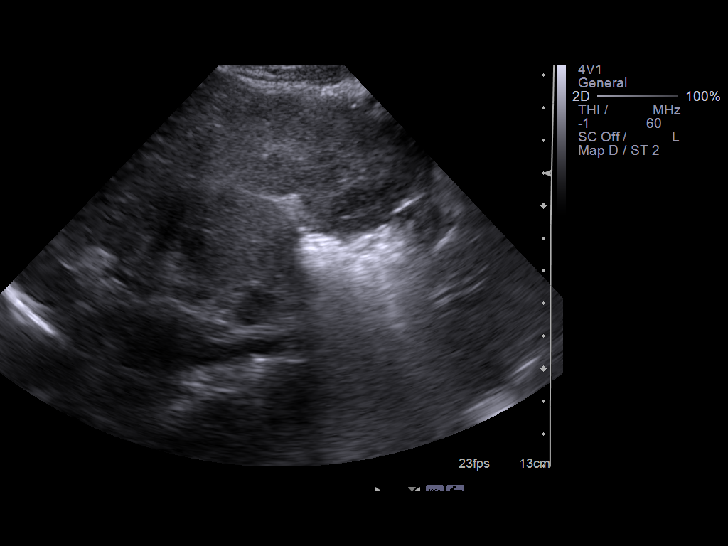
[im 57/69]
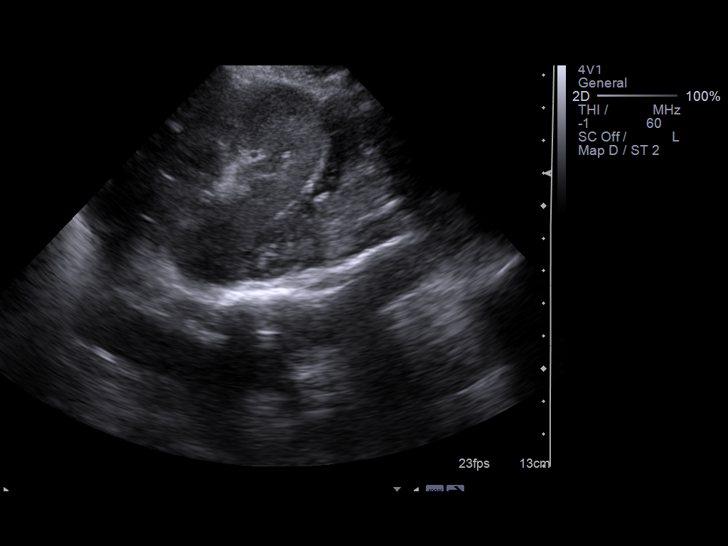
[im 63/69]
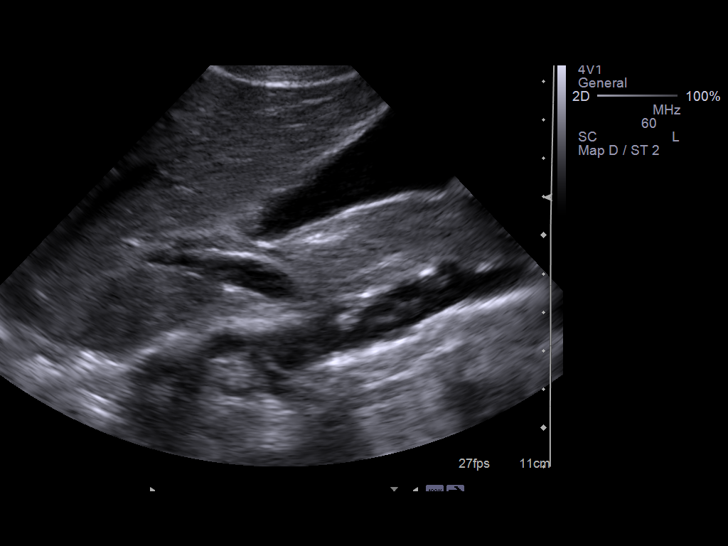
[im 69/69]
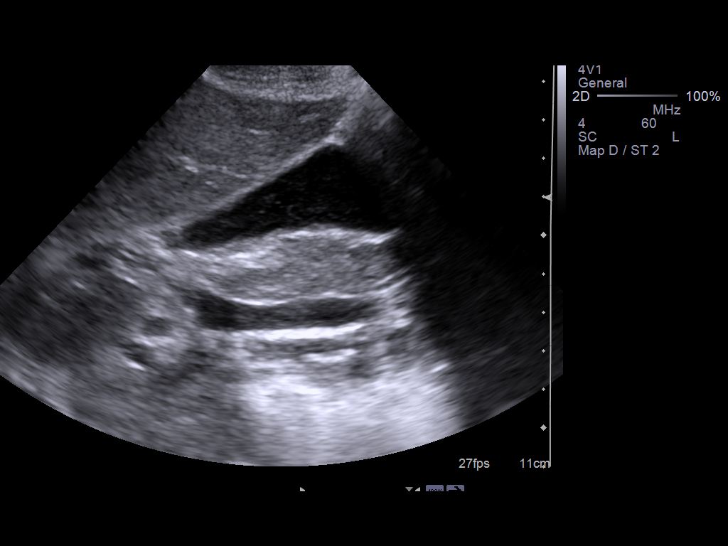

[14 of 25 positions shown; findings below may reference images not displayed]

FINDINGS: Gallbladder:  The gallbladder is visualized and no gallstones are
seen.  There is no pain over the gallbladder with compression.

Common bile duct:  The common bile duct is normal measuring 4.6 mm
in diameter.

Liver:  The liver has a normal echogenic pattern.  No ductal
dilatation is seen.

IVC:  Appears normal.

Pancreas:  No focal abnormality seen.

Spleen:  The spleen is normal measuring 6.8 cm sagittally.

Right Kidney:  No hydronephrosis is seen.  The right kidney
measures 11.3 cm sagittally.  The echogenicity of renal parenchyma
is echogenic suggesting chronic renal medical disease.

Left Kidney:  No hydronephrosis.  The left kidney measures 10.7 cm
sagittally.  The parenchyma is echogenic as well.

Abdominal aorta:  The abdominal aorta is normal in caliber, being
obscured distally by bowel gas.
IMPRESSION: 1.  No gallstones.  No ductal dilatation.
2.  Echogenic renal parenchyma may indicate chronic renal medical
disease.  No hydronephrosis.  Correlate with renal laboratory
values.

## 2012-01-14 IMAGING — CR DG CHEST 1V PORT
1 series · 1 of 1 positions shown · non-contrast
Comparison: 11/20/2009

CLINICAL DATA: Septic.  Unresponsive.

PORTABLE CHEST - 1 VIEW

[AP]
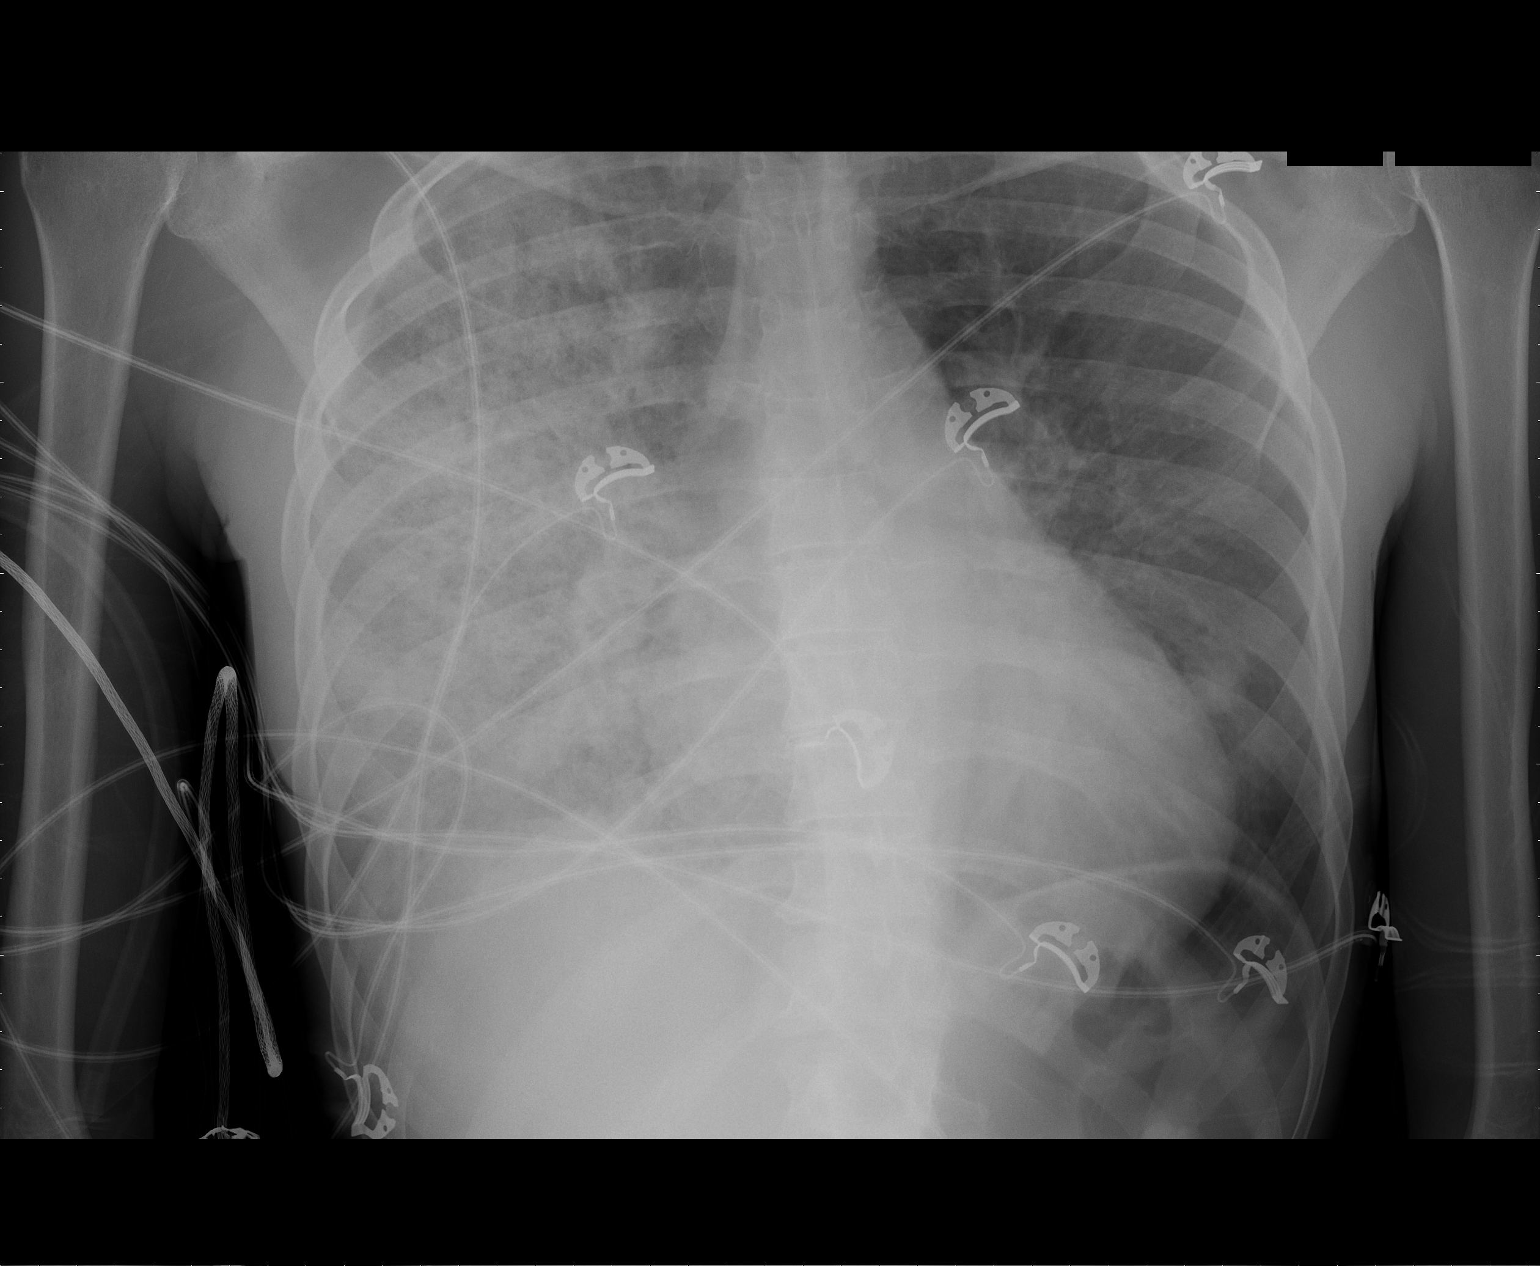

[1 of 1 positions shown; findings below may reference images not displayed]

FINDINGS: Midline trachea.  Borderline cardiomegaly.     No left
and no definite right pleural effusion. No pneumothorax.  Near
complete whiteout of the right hemithorax secondary to airspace and
interstitial disease.  Suspect left perihilar interstitial edema. A
left-sided probable nipple shadow is again identified.
IMPRESSION: Severe right-sided interstitial and airspace disease.  Suspect mild
left perihilar interstitial disease.  Considerations include right-
sided infection/aspiration and/or asymmetric pulmonary edema.

## 2012-01-14 IMAGING — CR DG ANKLE COMPLETE 3+V*R*
3 series · 3 of 3 positions shown · non-contrast
Comparison: Right ankle films of 12/04/2009

CLINICAL DATA: Gangrene, sepsis, pain

RIGHT ANKLE - COMPLETE 3+ VIEW

[t ankle joint lat right]
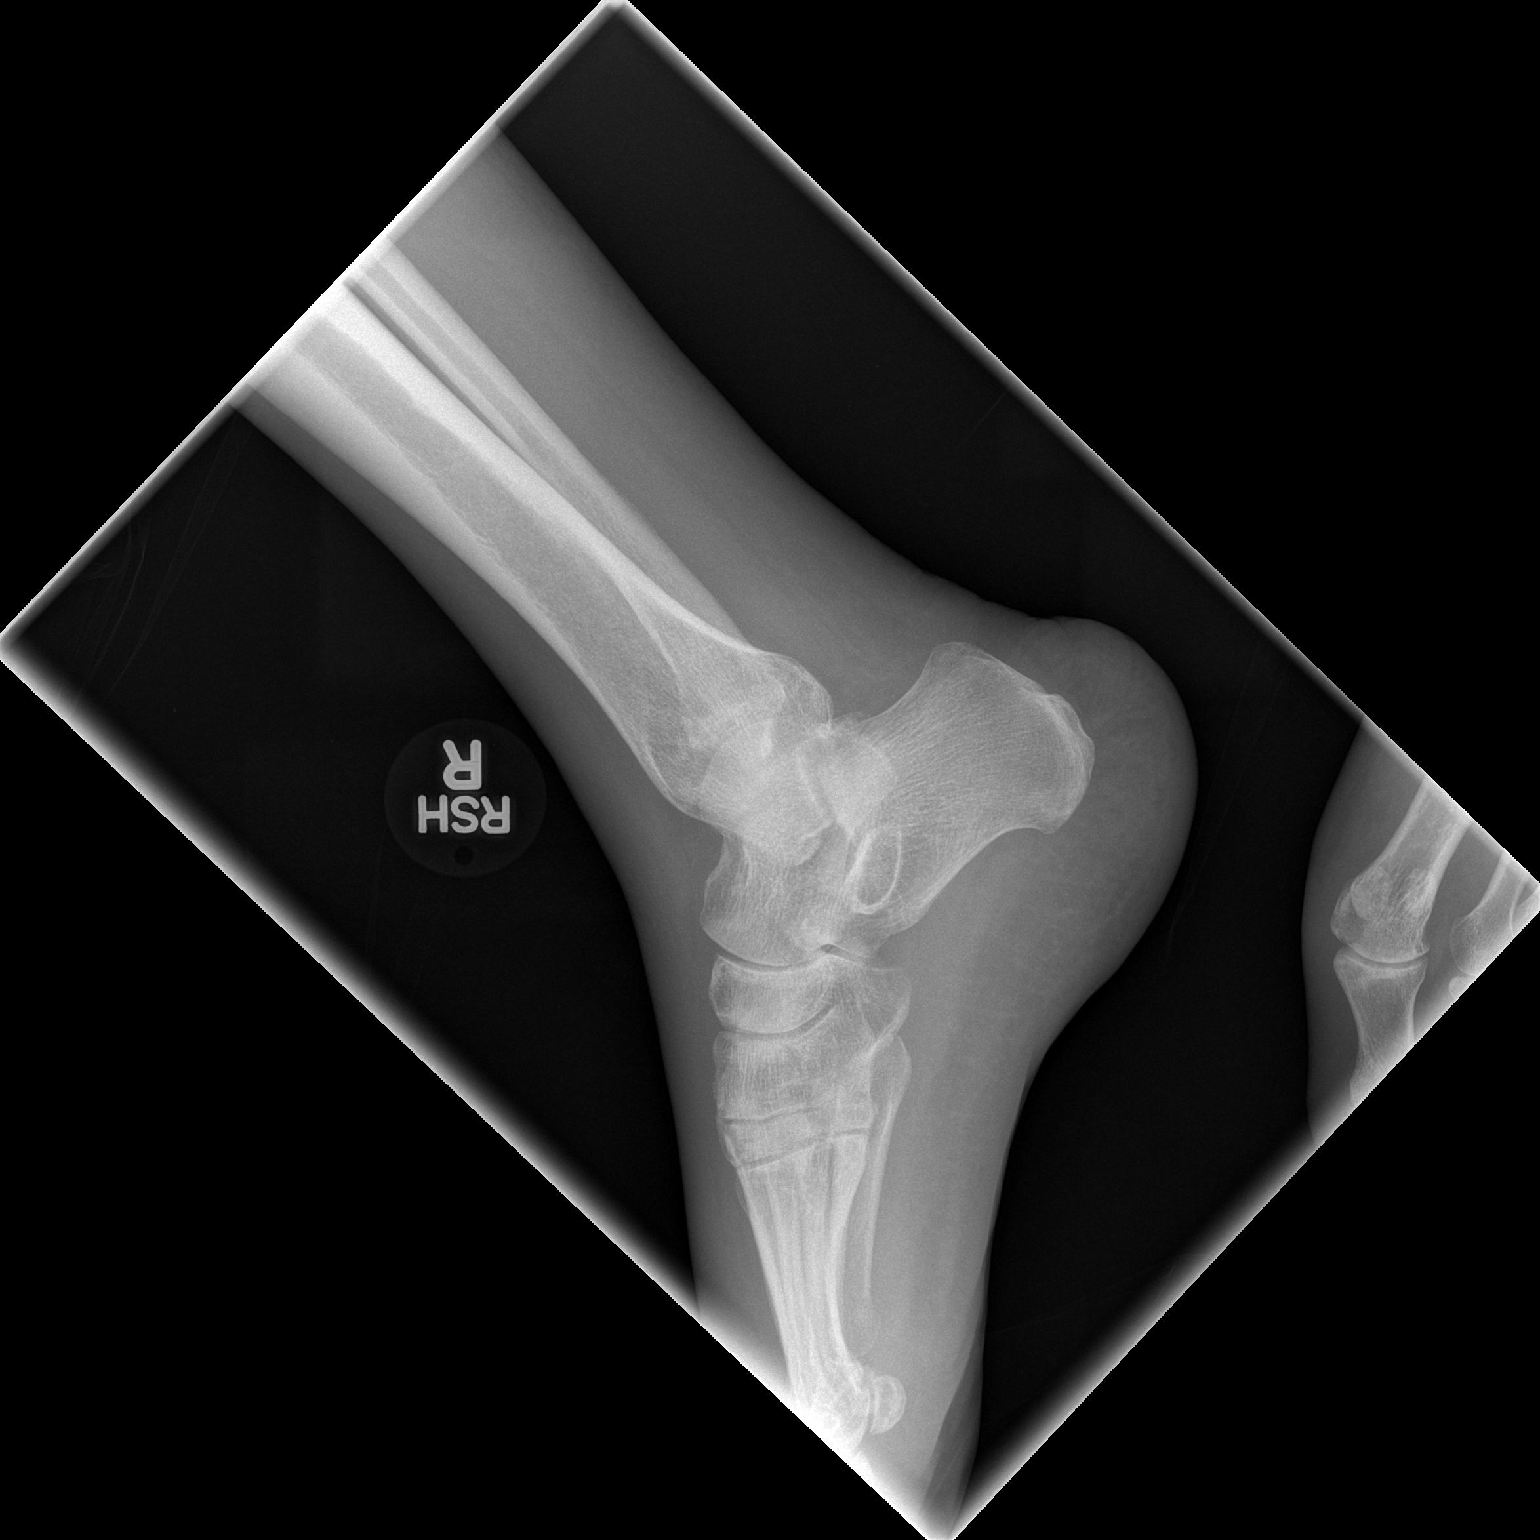

[t ankle joint ap right]
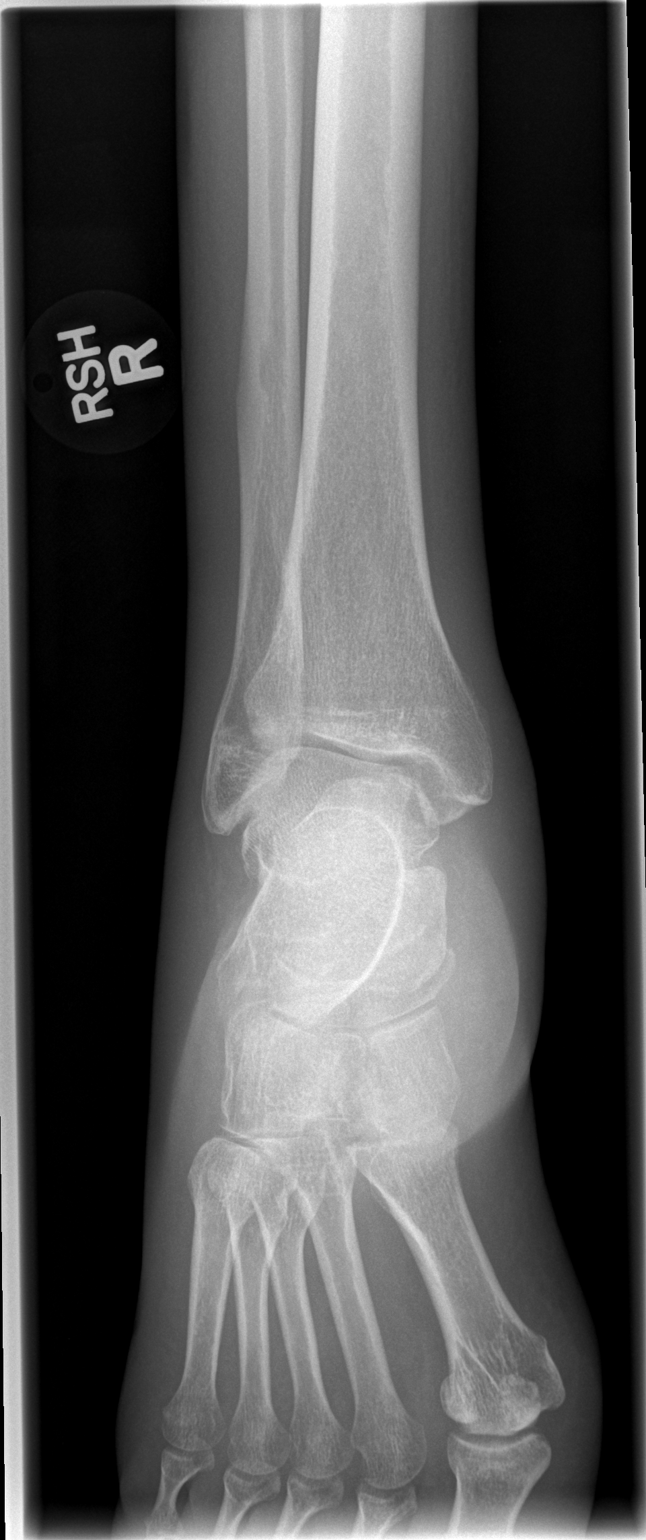

[t ankle joint oblique right]
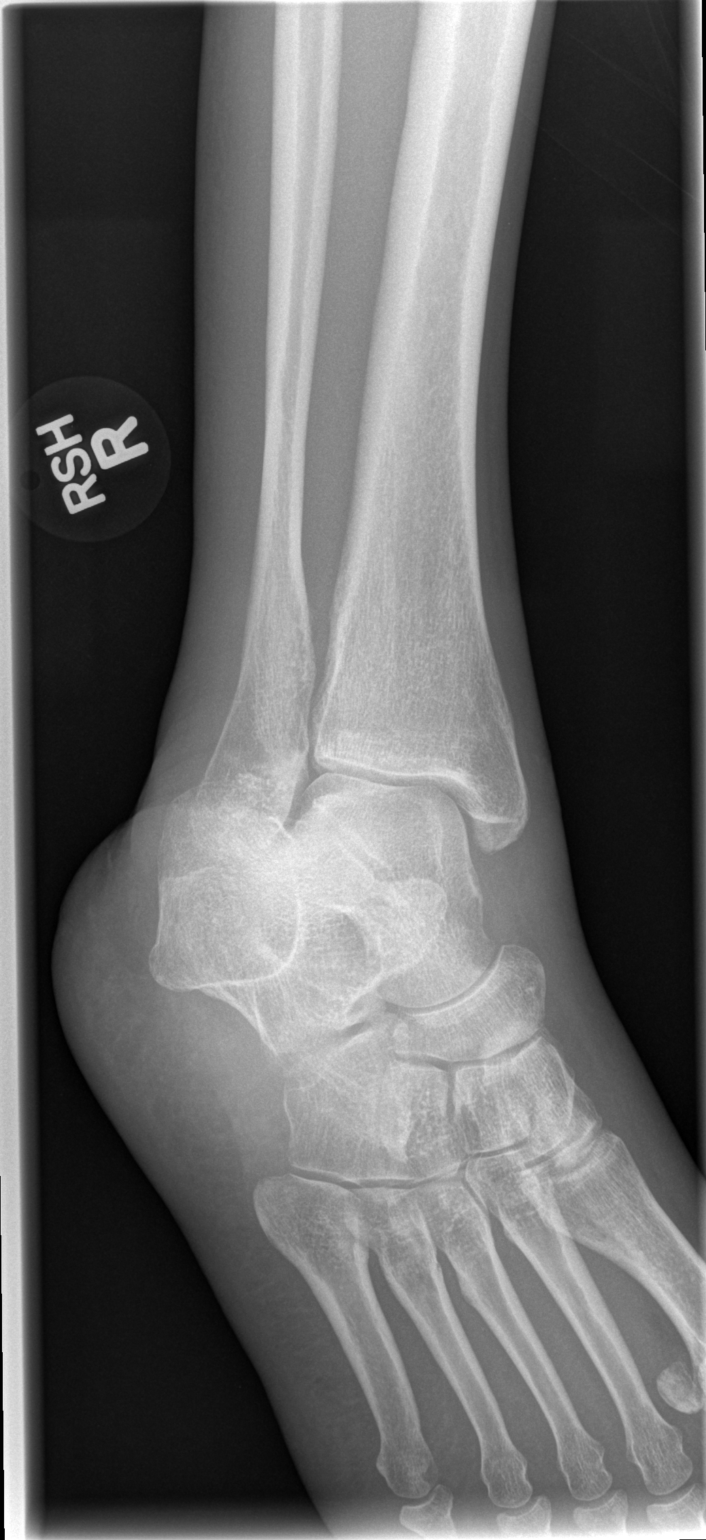

[3 of 3 positions shown; findings below may reference images not displayed]

FINDINGS: No definite evidence of osteomyelitis is seen.  The ankle
joint appears normal.  Alignment is normal.
IMPRESSION: No definite evidence of osteomyelitis.

## 2012-01-14 IMAGING — CR DG CHEST 1V PORT
1 series · 1 of 1 positions shown · non-contrast
Comparison: 01/30/2010.

CLINICAL DATA: Lower extremity gangrene.  Sepsis.

PORTABLE CHEST - 1 VIEW

[view not recorded]
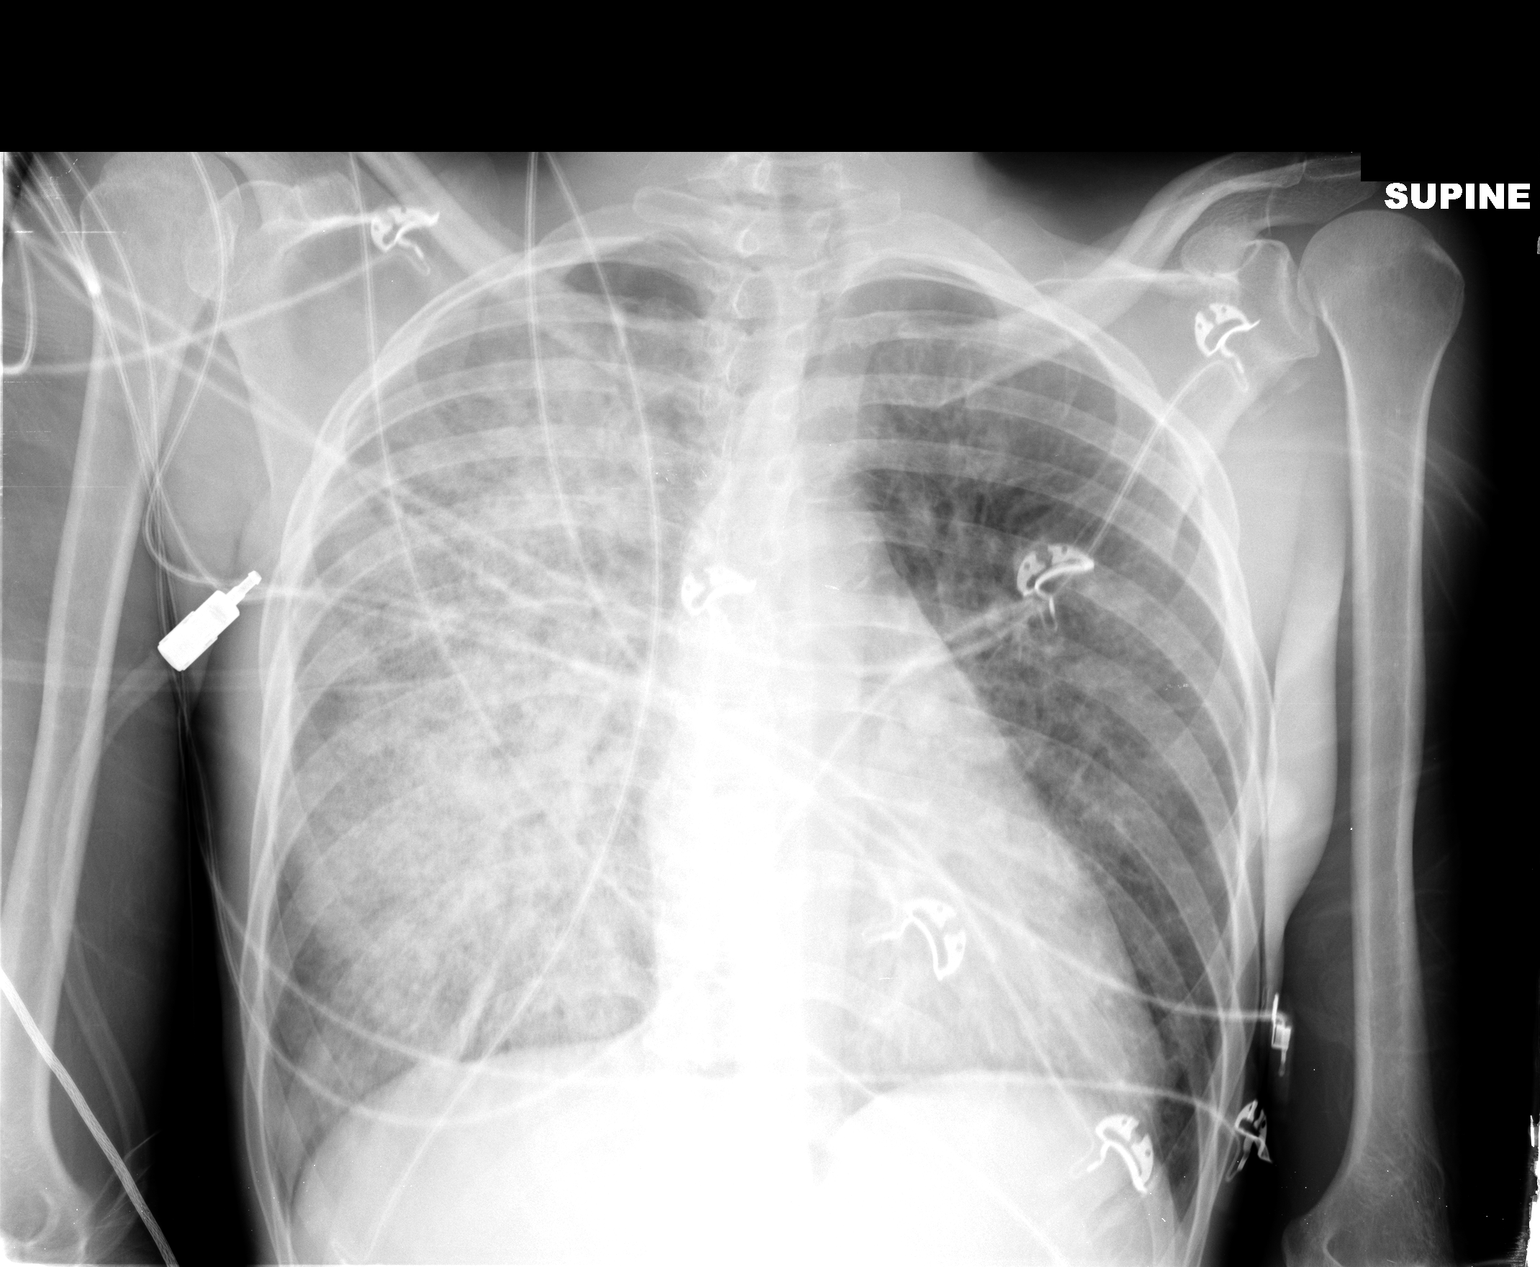

[1 of 1 positions shown; findings below may reference images not displayed]

FINDINGS: Extensive airspace disease involves the right lung.  No
appreciable interval change.  Peribronchial markings are
accentuated.  Normal cardiomediastinal silhouette.
IMPRESSION: Severe airspace disease involve the right lung appears unchanged.

## 2012-01-14 IMAGING — CR DG FOOT COMPLETE 3+V*L*
3 series · 3 of 3 positions shown · non-contrast
Comparison: Left foot films of 05/31/2009

CLINICAL DATA: Gangrene, sepsis, pain

LEFT FOOT - COMPLETE 3+ VIEW

[t foot ap left]
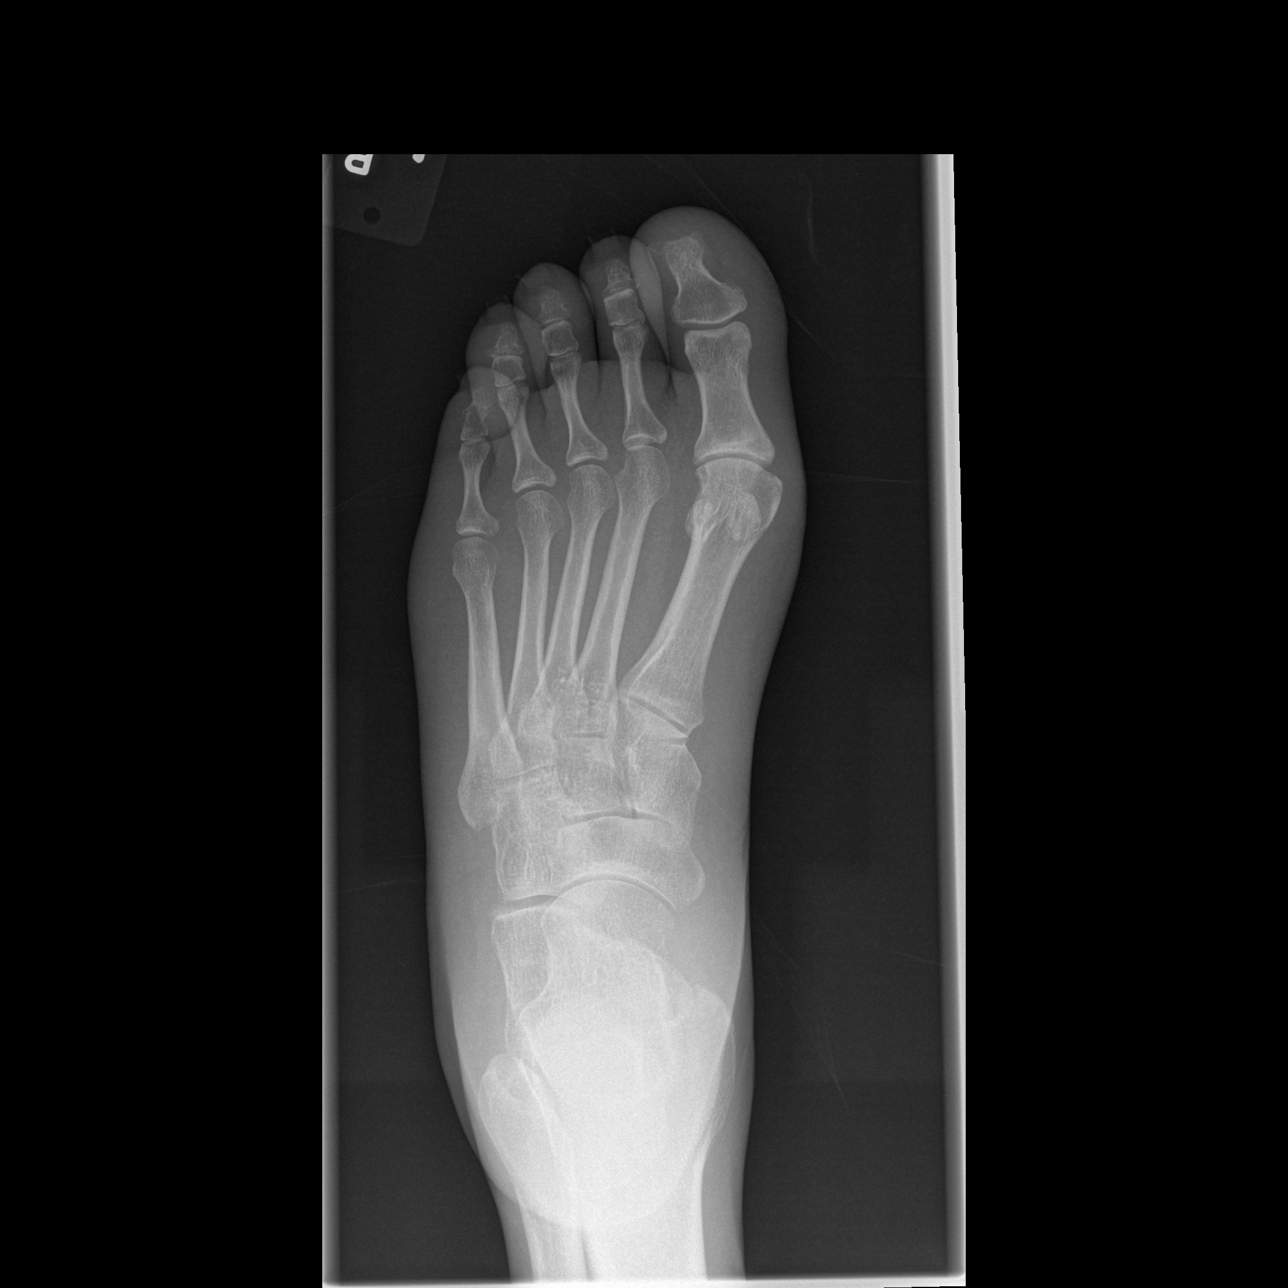

[t foot oblique left]
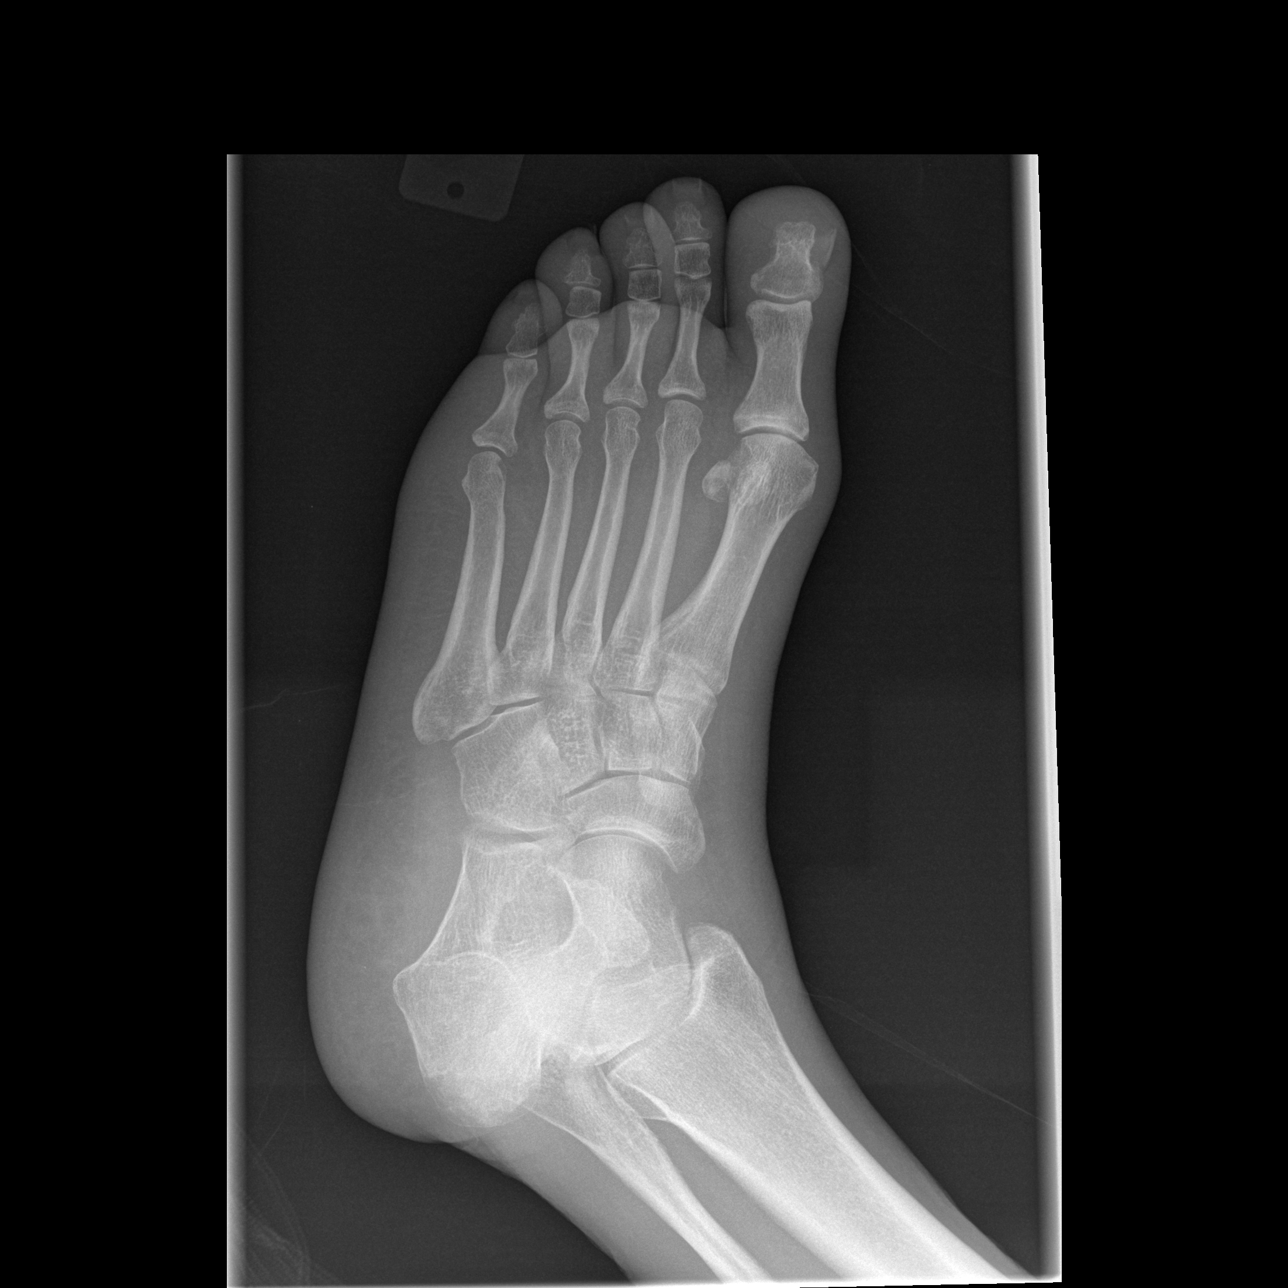

[t foot lat left]
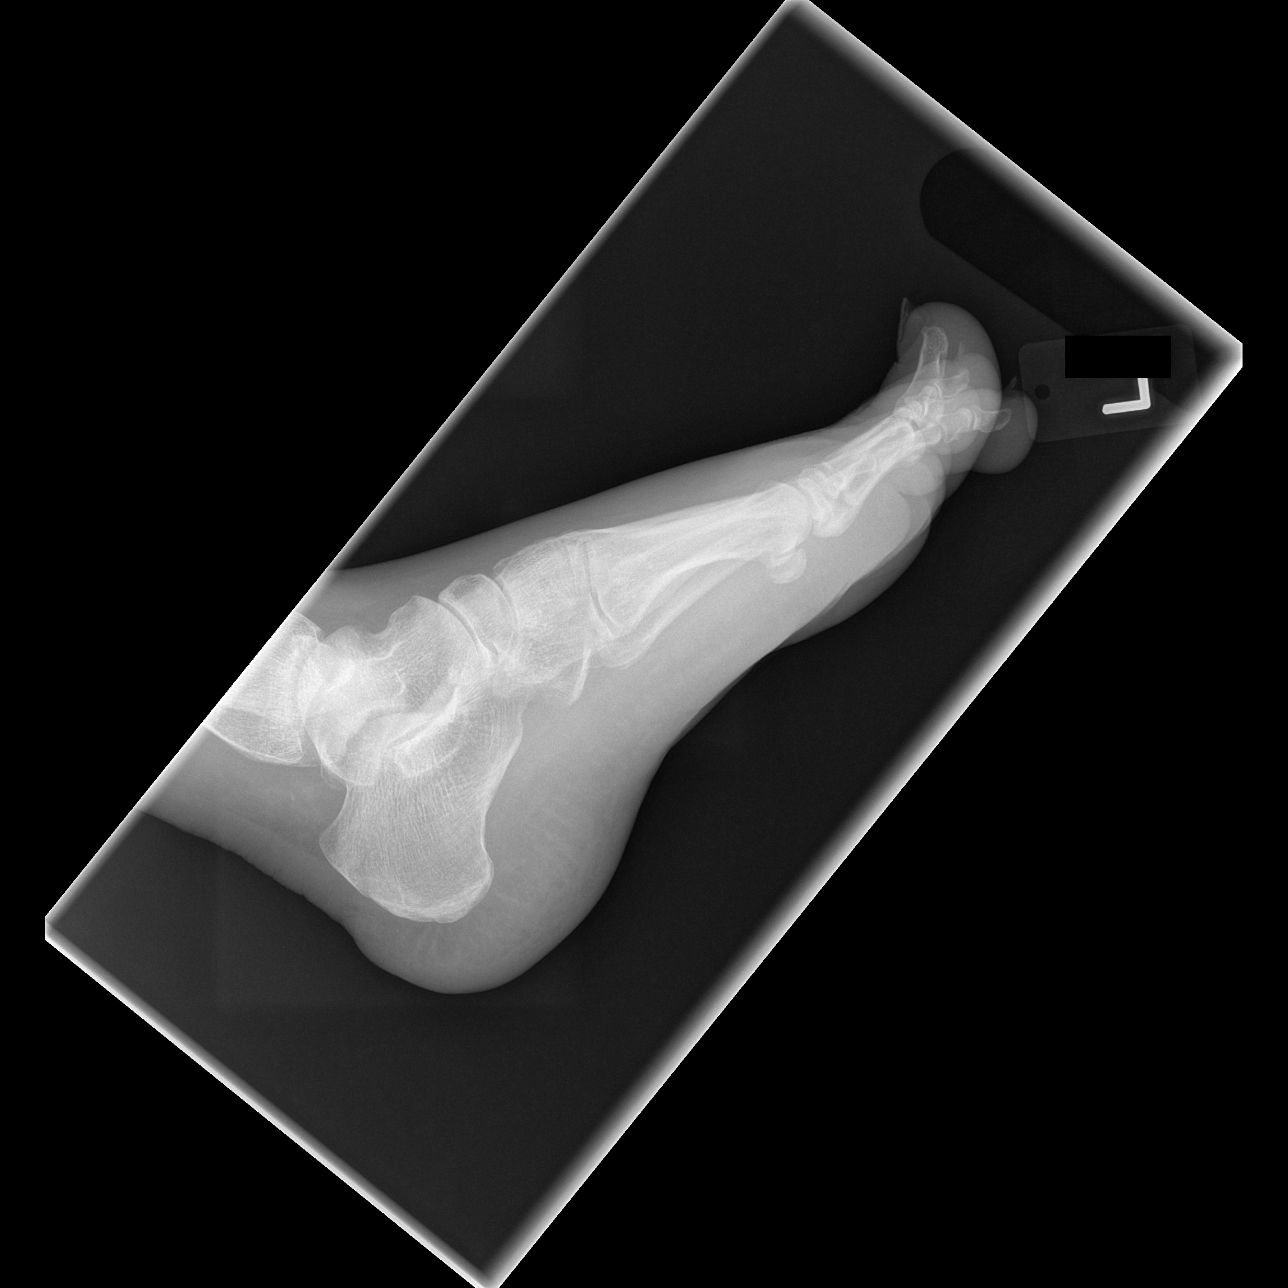

[3 of 3 positions shown; findings below may reference images not displayed]

FINDINGS: No radiographic evidence of osteomyelitis is seen.
Tarsal - metatarsal alignment is normal.  There is mild
degenerative change at the left first MTP joint.
IMPRESSION: No evidence of osteomyelitis.

## 2012-01-14 NOTE — ED Provider Notes (Signed)
History    51 year old female with a chief complaint right hand pain. Gradual onset. First noticed this morning. Since onset it seems like it has been getting worse. No history similar pain. Pain is primarily in the distal aspects of her fingers and extends proximally to the base of her hand. Denies trauma. No fevers or chills. No nausea or vomiting. No rash. His pain is slightly swollen. Since being in ER patient states that she also feels like she is running at right foot pain as well. CSN: [id number]  Arrival date & time 01/05/12  2307   First MD Initiated Contact with Patient 01/06/12 0134      Chief Complaint  Patient presents with  . Hand Pain    (Consider location/radiation/quality/duration/timing/severity/associated sxs/prior treatment) HPI  Past Medical History  Diagnosis Date  . 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 Followed by Dr. Louanne Skye  . Hypertension   . Cocaine abuse     ongoing with resultant vaculitis.  . Tobacco abuse   . Depression   . Normocytic anemia     BL Hgb 9.8-12. Last anemia panel 04/2010 - showing Fe 19, ferritin 101.  Pt on monthly B12 injections  . Rheumatoid arthritis     patient reported    Past Surgical History  Procedure Date  . Skin biopsy     bilateral shin nodules on 04/2010  . Hernia repair     Family History  Problem Relation Age of Onset  . Breast cancer Mother     Breast cancer  . Alcohol abuse Mother   . Colon cancer Maternal Aunt 77  . Alcohol abuse Father     History  Substance Use Topics  . Smoking status: Current Everyday Smoker -- 0.2 packs/day for 39 years    Types: Cigarettes  . Smokeless tobacco: Never Used  . Alcohol Use: No    OB History    Grav Para Term Preterm Abortions TAB SAB Ect Mult Living                  Review of Systems   Review of symptoms negative unless otherwise noted in HPI.   Allergies  Acetaminophen  Home Medications   Current Outpatient Rx  Name Route Sig Dispense Refill  .  DIPHENHYDRAMINE HCL 25 MG PO CAPS Oral Take 25 mg by mouth every 6 (six) hours as needed. itching     . FLUOXETINE HCL 20 MG PO CAPS Oral Take 40 mg by mouth daily.    Marland Kitchen KETOROLAC TROMETHAMINE 10 MG PO TABS Oral Take 10 mg by mouth every 6 (six) hours as needed. For pain    . LISINOPRIL 20 MG PO TABS Oral Take 20 mg by mouth daily. For blood pressure     . MULTIVITAMINS PO CAPS Oral Take 1 capsule by mouth daily.      Marland Kitchen NAPROXEN 500 MG PO TABS Oral Take 1 tablet (500 mg total) by mouth 2 (two) times daily as needed. 30 tablet 0  . OXYCODONE HCL 5 MG PO TABS Oral Take 1 tablet (5 mg total) by mouth every 6 (six) hours as needed for pain. 10 tablet 0  . PREDNISONE 20 MG PO TABS Oral Take 2 tablets (40 mg total) by mouth daily. 10 tablet 0  . TRAMADOL HCL 50 MG PO TABS Oral Take 50 mg by mouth every 6 (six) hours as needed. For pain     . VITAMIN B-12 1000 MCG PO TABS Oral Take 1,000 mcg  by mouth daily.      Marland Kitchen ZOLPIDEM TARTRATE 10 MG PO TABS Oral Take 10 mg by mouth at bedtime.        BP 141/82  Pulse 78  Temp(Src) 98.2 F (36.8 C) (Oral)  Resp 16  SpO2 99%  Physical Exam  Nursing note and vitals reviewed. Constitutional: She appears well-developed and well-nourished. No distress.  HENT:  Head: Normocephalic and atraumatic.  Eyes: Conjunctivae are normal. Right eye exhibits no discharge. Left eye exhibits no discharge.  Neck: Neck supple.  Cardiovascular: Normal rate, regular rhythm and normal heart sounds.  Exam reveals no gallop and no friction rub.   No murmur heard. Pulmonary/Chest: Effort normal and breath sounds normal. No respiratory distress.  Musculoskeletal:       Right hand with very mild edema of the distal aspect of the third and fourth fingers. There is no significant tenderness along the flexor sheath. Skin is intact. Mild tenderness. Cannot appreciate erythema as mentioned in the triage note. Patient has good grip strength. The flexor and extensor function of the fingers  is intact. Patient has good 2 point discrimination at the fingertips. Cap refill is about 2 seconds at the fingertips. She has good radial pulses. Right foot grossly normal to inspection. Symmetric as compared to the left. No skin lesions noted. Palpable DP pulses. No significant tenderness. No increased pain with range of motion of the ankle.  Neurological: She is alert.  Skin: Skin is warm and dry.  Psychiatric: She has a normal mood and affect. Her behavior is normal. Thought content normal.    ED Course  Procedures (including critical care time)  Labs Reviewed - No data to display No results found.   1. Arthralgia       MDM  51 year old female with right hand pain. Possibly secondary to arthritis. No history of trauma. Neurologically and vascular intact. Plan symptomatic treatment. Outpatient followup was discussed.        Virgel Manifold, MD 01/14/12 [id number]

## 2012-01-16 IMAGING — CR DG CHEST 1V PORT
1 series · 1 of 1 positions shown · non-contrast
Comparison: 01/30/2010

CLINICAL DATA: Lower extremity gangrene.  Sepsis.

PORTABLE CHEST - 1 VIEW

[view not recorded]
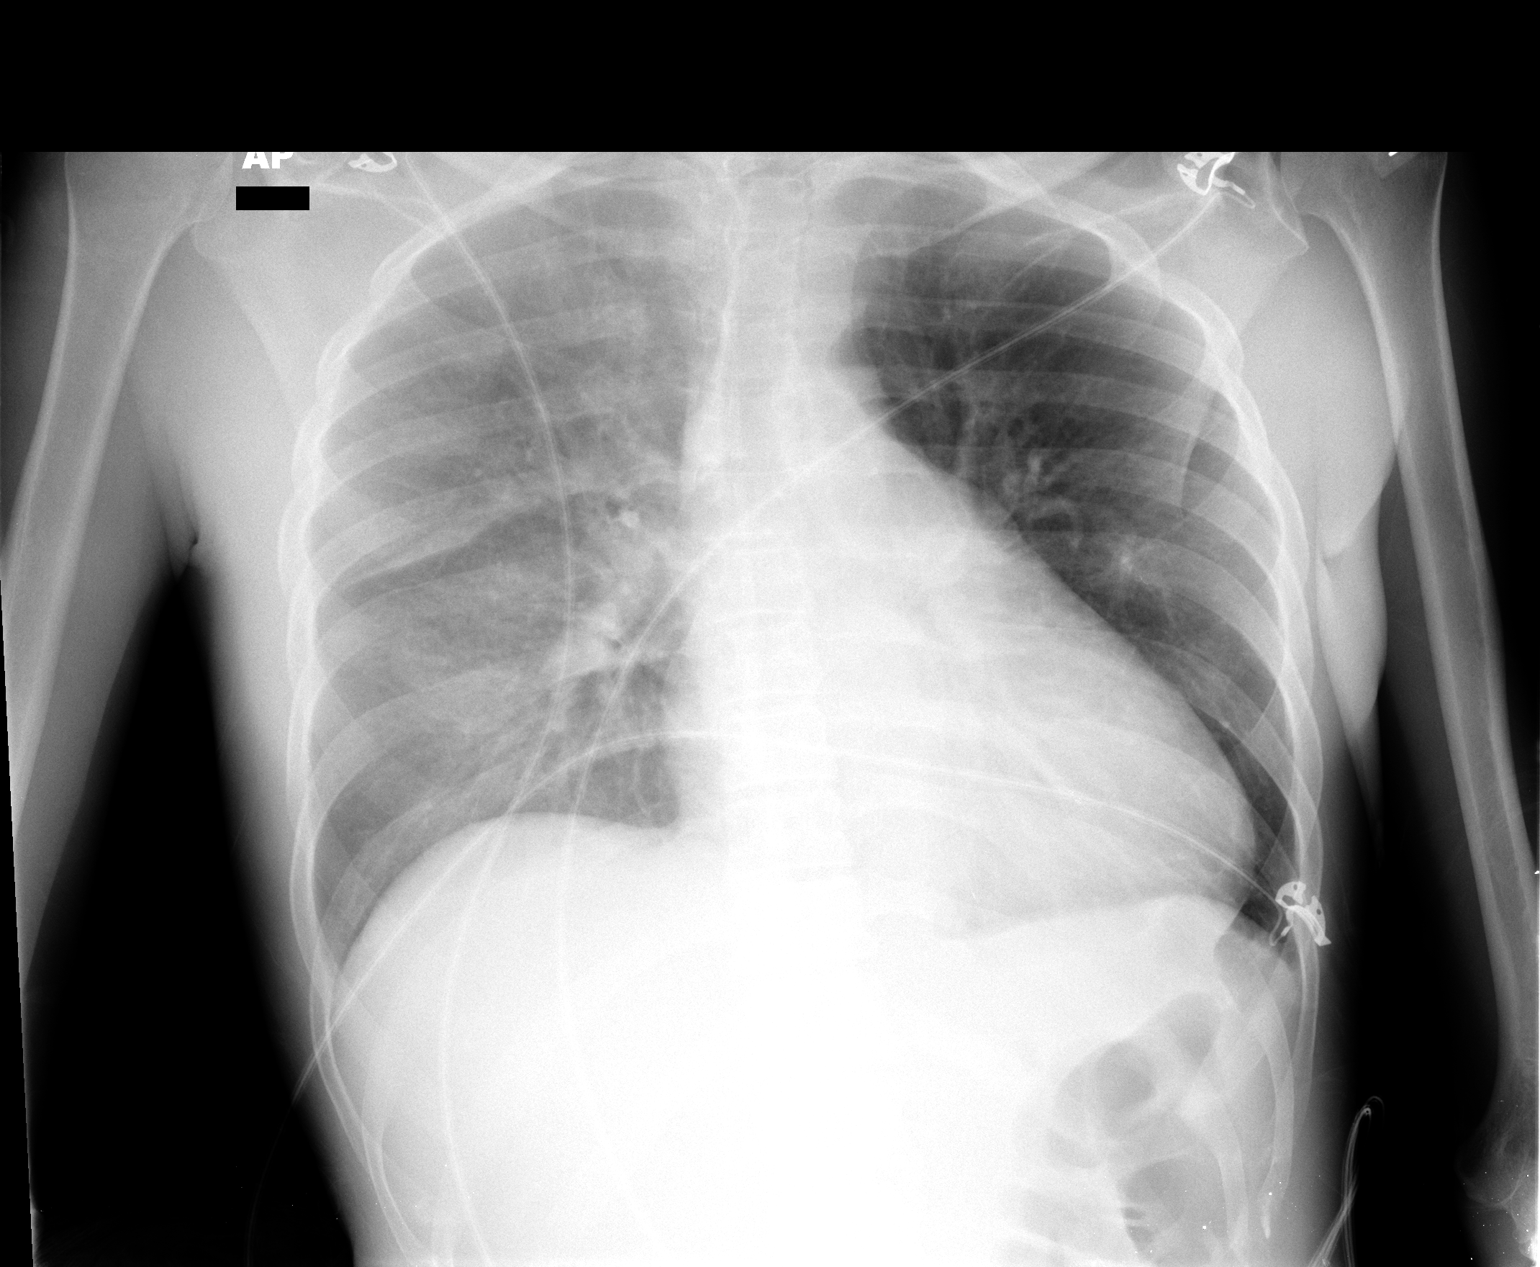

[1 of 1 positions shown; findings below may reference images not displayed]

FINDINGS: Significant improvement in asymmetric right lung airspace
disease is seen since previous study.  Small layering right pleural
effusion again seen extending into the minor fissure.  No new or
worsening areas of pulmonary past year seen.  Heart size is within
normal limits.
IMPRESSION: Significant improvement in asymmetric right lung airspace disease.
Persistent small layering right pleural effusion.

## 2012-01-20 ENCOUNTER — Encounter: Payer: Self-pay | Admitting: Internal Medicine

## 2012-01-20 ENCOUNTER — Ambulatory Visit (INDEPENDENT_AMBULATORY_CARE_PROVIDER_SITE_OTHER): Payer: Medicaid Other | Admitting: Internal Medicine

## 2012-01-20 VITALS — BP 139/83 | HR 57 | Temp 97.2°F | Ht 61.0 in | Wt 101.0 lb

## 2012-01-20 DIAGNOSIS — M129 Arthropathy, unspecified: Secondary | ICD-10-CM

## 2012-01-20 NOTE — Progress Notes (Signed)
Patient ID: MACEE VENABLES, female   DOB: 03-Jul-1961, 51 y.o.   MRN: [id number]  Subjective:   Patient ID: ELEINA JERGENS female   DOB: May 29, 1961 51 y.o.   MRN: [id number]  HPI: Ms.Samantha D Mackowiak is a 51 y.o. with history of levimasole associated vasculitis, cocaine abuse, HTN who present for follow-up after being evaluated for right hand knuckles swelling at [facility] ED. She reports that she went to ED for right knuckles pain and swelling on 01/05/12 and was discharged on prednisone tapering dose. She states that she has been compliant with her medical treatment while living in an assist living facility. She reports that her symptoms have been completely resolved. She has not had any hand or other joints pain/swelling since her ED visit.  Of note, she states that she was diagnosed with RA by her MD at [facility] and her next scheduled appointment is on 01/27/12. She states that she is not on any medical treatment for RA.  Health maintenance She is uptodate   Past Medical History  Diagnosis Date  . 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 Followed by Dr. Louanne Skye  . Hypertension   . Cocaine abuse     ongoing with resultant vaculitis.  . Tobacco abuse   . Depression   . Normocytic anemia     BL Hgb 9.8-12. Last anemia panel 04/2010 - showing Fe 19, ferritin 101.  Pt on monthly B12 injections  . Rheumatoid arthritis     patient reported   Current Outpatient Prescriptions  Medication Sig Dispense Refill  . diphenhydrAMINE (BENADRYL) 25 mg capsule Take 25 mg by mouth every 6 (six) hours as needed. itching       . FLUoxetine (PROZAC) 20 MG capsule Take 40 mg by mouth daily.      Marland Kitchen ketorolac (TORADOL) 10 MG tablet Take 10 mg by mouth every 6 (six) hours as needed. For pain      . lisinopril (PRINIVIL,ZESTRIL) 20 MG tablet Take 20 mg by mouth daily. For blood pressure       . Multiple Vitamin (MULTIVITAMIN) capsule Take 1 capsule by mouth daily.        . naproxen (NAPROSYN) 500 MG tablet Take 1 tablet (500 mg total)  by mouth 2 (two) times daily as needed.  30 tablet  0  . traMADol (ULTRAM) 50 MG tablet Take 50 mg by mouth every 6 (six) hours as needed. For pain       . vitamin B-12 (CYANOCOBALAMIN) 1000 MCG tablet Take 1,000 mcg by mouth daily.        Marland Kitchen zolpidem (AMBIEN) 10 MG tablet Take 10 mg by mouth at bedtime.         Family History  Problem Relation Age of Onset  . Breast cancer Mother     Breast cancer  . Alcohol abuse Mother   . Colon cancer Maternal Aunt 70  . Alcohol abuse Father    History   Social History  . Marital Status: Single    Spouse Name: N/A    Number of Children: 0  . Years of Education: 11th grade   Occupational History  . Disability     since 2011, due to her rheumatoid arthritis   Social History Main Topics  . Smoking status: Current Everyday Smoker -- 0.2 packs/day for 39 years    Types: Cigarettes  . Smokeless tobacco: Never Used  . Alcohol Use: No  . Drug Use: Yes    Special: "Crack" cocaine,  Marijuana, Cocaine     smokes cocaine  . Sexually Active: No   Other Topics Concern  . 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 No shortness of breath or dyspnea on exertion. No chest pain, chest pressure or palpitation No nausea, vomiting, or abdominal pain. No melena, diarrhea or incontinence. No muscle weakness.                   Denies depression. No appetite or weight changes.     Objective:  Physical Exam: Filed Vitals:   01/20/12 1132  BP: 139/83  Pulse: 57  Temp: 97.2 F (36.2 C)  TempSrc: Oral  Height: 5' 1"  (1.549 m)  Weight: 101 lb (45.813 kg)  SpO2: 100%   General: alert, well-developed, and cooperative to examination.  Head: normocephalic and atraumatic.  Eyes: vision grossly intact, pupils equal, pupils round, pupils reactive to light, no  injection and anicteric.  Mouth: pharynx pink and moist, no erythema, and no exudates.  Neck: supple, full ROM, no thyromegaly, no JVD, and no carotid bruits.  Lungs: normal respiratory effort, no accessory muscle use, normal breath sounds, no crackles, and no wheezes. Heart: normal rate, regular rhythm, no murmur, no gallop, and no rub.  Abdomen: soft, non-tender, normal bowel sounds, no distention, no guarding, no rebound tenderness, no hepatomegaly, and no splenomegaly.  Msk: no joint swelling, no joint warmth, and no redness over joints.  Pulses: 2+ DP/PT pulses bilaterally Extremities: No cyanosis, clubbing, edema Neurologic: alert & oriented X3, cranial nerves II-XII intact, strength normal in all extremities, sensation intact to light touch, and gait normal.  Skin: turgor normal and no rashes.  Psych: Oriented X3, memory intact for recent and remote, normally interactive, good eye contact, not anxious appearing, and not depressed appearing.    Assessment & Plan:

## 2012-01-20 NOTE — Patient Instructions (Signed)
1. Continue to follow up with your Rheumatologist at [facility] 2. will obtain records 2. Finish your prednisone tapering course

## 2012-01-20 NOTE — Assessment & Plan Note (Signed)
Self reported RA, not on long term treatment per patient. Her next Rheumatology appt is on 01/27/12.  - will obtain medical records from [facility].

## 2012-02-13 ENCOUNTER — Inpatient Hospital Stay (HOSPITAL_COMMUNITY)
Admission: EM | Admit: 2012-02-13 | Discharge: 2012-02-15 | DRG: 313 | Payer: Medicaid Other | Attending: Internal Medicine | Admitting: Internal Medicine

## 2012-02-13 ENCOUNTER — Encounter (HOSPITAL_COMMUNITY): Payer: Self-pay | Admitting: *Deleted

## 2012-02-13 ENCOUNTER — Emergency Department (HOSPITAL_COMMUNITY): Payer: Medicaid Other

## 2012-02-13 DIAGNOSIS — M79642 Pain in left hand: Secondary | ICD-10-CM

## 2012-02-13 DIAGNOSIS — D709 Neutropenia, unspecified: Secondary | ICD-10-CM | POA: Diagnosis present

## 2012-02-13 DIAGNOSIS — I10 Essential (primary) hypertension: Secondary | ICD-10-CM | POA: Diagnosis present

## 2012-02-13 DIAGNOSIS — M19049 Primary osteoarthritis, unspecified hand: Secondary | ICD-10-CM | POA: Diagnosis present

## 2012-02-13 DIAGNOSIS — F32A Depression, unspecified: Secondary | ICD-10-CM

## 2012-02-13 DIAGNOSIS — I776 Arteritis, unspecified: Secondary | ICD-10-CM | POA: Insufficient documentation

## 2012-02-13 DIAGNOSIS — M79609 Pain in unspecified limb: Secondary | ICD-10-CM | POA: Diagnosis present

## 2012-02-13 DIAGNOSIS — F329 Major depressive disorder, single episode, unspecified: Secondary | ICD-10-CM

## 2012-02-13 DIAGNOSIS — F1490 Cocaine use, unspecified, uncomplicated: Secondary | ICD-10-CM

## 2012-02-13 DIAGNOSIS — R209 Unspecified disturbances of skin sensation: Secondary | ICD-10-CM | POA: Diagnosis present

## 2012-02-13 DIAGNOSIS — R9431 Abnormal electrocardiogram [ECG] [EKG]: Secondary | ICD-10-CM

## 2012-02-13 DIAGNOSIS — F3289 Other specified depressive episodes: Secondary | ICD-10-CM | POA: Diagnosis present

## 2012-02-13 DIAGNOSIS — M069 Rheumatoid arthritis, unspecified: Secondary | ICD-10-CM | POA: Diagnosis present

## 2012-02-13 DIAGNOSIS — R0789 Other chest pain: Principal | ICD-10-CM | POA: Diagnosis present

## 2012-02-13 DIAGNOSIS — R079 Chest pain, unspecified: Secondary | ICD-10-CM | POA: Diagnosis present

## 2012-02-13 DIAGNOSIS — F1410 Cocaine abuse, uncomplicated: Secondary | ICD-10-CM | POA: Diagnosis present

## 2012-02-13 LAB — COMPREHENSIVE METABOLIC PANEL
Albumin: 3.2 g/dL — ABNORMAL LOW (ref 3.5–5.2)
Alkaline Phosphatase: 74 U/L (ref 39–117)
BUN: 16 mg/dL (ref 6–23)
Calcium: 9.2 mg/dL (ref 8.4–10.5)
GFR calc Af Amer: 82 mL/min — ABNORMAL LOW (ref >90)
Glucose, Bld: 106 mg/dL — ABNORMAL HIGH (ref 70–99)
Potassium: 3.4 mEq/L — ABNORMAL LOW (ref 3.5–5.1)
Total Protein: 6.4 g/dL (ref 6.0–8.3)

## 2012-02-13 LAB — POCT I-STAT TROPONIN I
Troponin i, poc: 0 ng/mL (ref 0.00–0.08)
Troponin i, poc: 0 ng/mL (ref 0.00–0.08)

## 2012-02-13 LAB — CBC
Hemoglobin: 11.9 g/dL — ABNORMAL LOW (ref 12.0–15.0)
MCH: 29.3 pg (ref 26.0–34.0)
MCH: 28.8 pg (ref 26.0–34.0)
MCHC: 33.9 g/dL (ref 30.0–36.0)
Platelets: 233 10*3/uL (ref 150–400)
RBC: 4.06 MIL/uL (ref 3.87–5.11)
RDW: 15.4 % (ref 11.5–15.5)
WBC: 3.7 10*3/uL — ABNORMAL LOW (ref 4.0–10.5)

## 2012-02-13 LAB — DIFFERENTIAL
Basophils Absolute: 0 10*3/uL (ref 0.0–0.1)
Basophils Relative: 0 % (ref 0–1)
Eosinophils Absolute: 0.1 10*3/uL (ref 0.0–0.7)
Monocytes Absolute: 0 10*3/uL — ABNORMAL LOW (ref 0.1–1.0)
Neutro Abs: 1.5 10*3/uL — ABNORMAL LOW (ref 1.7–7.7)
Neutrophils Relative %: 49 % (ref 43–77)

## 2012-02-13 LAB — RAPID URINE DRUG SCREEN, HOSP PERFORMED
Cocaine: POSITIVE — AB
Opiates: NOT DETECTED

## 2012-02-13 LAB — OCCULT BLOOD, POC DEVICE: Fecal Occult Bld: NEGATIVE

## 2012-02-13 LAB — APTT: aPTT: 35 seconds (ref 24–37)

## 2012-02-13 LAB — POCT I-STAT, CHEM 8
BUN: 15 mg/dL (ref 6–23)
Chloride: 107 mEq/L (ref 96–112)
Creatinine, Ser: 0.9 mg/dL (ref 0.50–1.10)
Sodium: 143 mEq/L (ref 135–145)

## 2012-02-13 LAB — LIPASE, BLOOD: Lipase: 28 U/L (ref 11–59)

## 2012-02-13 LAB — HEMOGLOBIN A1C
Hgb A1c MFr Bld: 5.8 % — ABNORMAL HIGH (ref <5.7)
Mean Plasma Glucose: 120 mg/dL — ABNORMAL HIGH (ref <117)

## 2012-02-13 LAB — PROTIME-INR: INR: 1.08 (ref 0.00–1.49)

## 2012-02-13 MED ORDER — ADULT MULTIVITAMIN W/MINERALS CH
1.0000 | ORAL_TABLET | Freq: Every day | ORAL | Status: DC
  Administered 2012-02-13 – 2012-02-15 (×3): 1 via ORAL
  Filled 2012-02-13 (×3): qty 1

## 2012-02-13 MED ORDER — LISINOPRIL 20 MG PO TABS
20.0000 mg | ORAL_TABLET | Freq: Every day | ORAL | Status: DC
  Administered 2012-02-14: 20 mg via ORAL
  Filled 2012-02-13: qty 1

## 2012-02-13 MED ORDER — ENOXAPARIN SODIUM 40 MG/0.4ML ~~LOC~~ SOLN
40.0000 mg | SUBCUTANEOUS | Status: DC
  Administered 2012-02-13 – 2012-02-14 (×2): 40 mg via SUBCUTANEOUS
  Filled 2012-02-13 (×3): qty 0.4

## 2012-02-13 MED ORDER — ASPIRIN EC 81 MG PO TBEC
81.0000 mg | DELAYED_RELEASE_TABLET | Freq: Every day | ORAL | Status: DC
  Administered 2012-02-14 – 2012-02-15 (×2): 81 mg via ORAL
  Filled 2012-02-13 (×2): qty 1

## 2012-02-13 MED ORDER — ASPIRIN 300 MG RE SUPP
300.0000 mg | RECTAL | Status: AC
  Filled 2012-02-13: qty 1

## 2012-02-13 MED ORDER — PREDNISONE 10 MG PO TABS
10.0000 mg | ORAL_TABLET | Freq: Every day | ORAL | Status: DC
  Administered 2012-02-13 – 2012-02-15 (×3): 10 mg via ORAL
  Filled 2012-02-13 (×4): qty 1

## 2012-02-13 MED ORDER — VITAMIN B-12 1000 MCG PO TABS
1000.0000 ug | ORAL_TABLET | Freq: Every day | ORAL | Status: DC
  Administered 2012-02-13 – 2012-02-15 (×3): 1000 ug via ORAL
  Filled 2012-02-13 (×3): qty 1

## 2012-02-13 MED ORDER — PREDNISONE 5 MG/ML PO CONC: 10.0000 mg | Freq: Every day | ORAL | Status: DC

## 2012-02-13 MED ORDER — ASPIRIN 81 MG PO CHEW
324.0000 mg | CHEWABLE_TABLET | Freq: Once | ORAL | Status: AC
  Administered 2012-02-13: 324 mg via ORAL
  Filled 2012-02-13: qty 4

## 2012-02-13 MED ORDER — TRAMADOL HCL 50 MG PO TABS
50.0000 mg | ORAL_TABLET | Freq: Four times a day (QID) | ORAL | Status: DC | PRN
  Administered 2012-02-14 – 2012-02-15 (×2): 50 mg via ORAL
  Filled 2012-02-13 (×2): qty 1

## 2012-02-13 MED ORDER — MULTIVITAMINS PO CAPS: 1.0000 | ORAL_CAPSULE | Freq: Every day | ORAL | Status: DC

## 2012-02-13 MED ORDER — FLUOXETINE HCL 20 MG PO CAPS
40.0000 mg | ORAL_CAPSULE | Freq: Every day | ORAL | Status: DC
  Administered 2012-02-14 – 2012-02-15 (×2): 40 mg via ORAL
  Filled 2012-02-13 (×2): qty 2

## 2012-02-13 MED ORDER — SODIUM CHLORIDE 0.9 % IJ SOLN: 3.0000 mL | INTRAMUSCULAR | Status: DC | PRN

## 2012-02-13 MED ORDER — SODIUM CHLORIDE 0.9 % IV SOLN: 250.0000 mL | INTRAVENOUS | Status: DC | PRN

## 2012-02-13 MED ORDER — NITROGLYCERIN 0.4 MG SL SUBL: 0.4000 mg | SUBLINGUAL_TABLET | SUBLINGUAL | Status: DC | PRN

## 2012-02-13 MED ORDER — DIPHENHYDRAMINE HCL 25 MG PO CAPS: 25.0000 mg | ORAL_CAPSULE | Freq: Four times a day (QID) | ORAL | Status: DC | PRN

## 2012-02-13 MED ORDER — NAPROXEN 500 MG PO TABS
500.0000 mg | ORAL_TABLET | Freq: Two times a day (BID) | ORAL | Status: DC
  Administered 2012-02-13 – 2012-02-14 (×2): 500 mg via ORAL
  Filled 2012-02-13 (×4): qty 1

## 2012-02-13 MED ORDER — ZOLPIDEM TARTRATE 5 MG PO TABS
10.0000 mg | ORAL_TABLET | Freq: Every day | ORAL | Status: DC
Start: 2012-02-13 — End: 2012-02-15
  Administered 2012-02-13 – 2012-02-14 (×2): 10 mg via ORAL
  Filled 2012-02-13 (×2): qty 2

## 2012-02-13 MED ORDER — ASPIRIN 81 MG PO CHEW
324.0000 mg | CHEWABLE_TABLET | ORAL | Status: AC
  Administered 2012-02-13: 324 mg via ORAL

## 2012-02-13 MED ORDER — SODIUM CHLORIDE 0.9 % IJ SOLN
3.0000 mL | Freq: Two times a day (BID) | INTRAMUSCULAR | Status: DC
  Administered 2012-02-13 – 2012-02-15 (×4): 3 mL via INTRAVENOUS

## 2012-02-13 MED ORDER — ONDANSETRON HCL 4 MG/2ML IJ SOLN: 4.0000 mg | Freq: Four times a day (QID) | INTRAMUSCULAR | Status: DC | PRN

## 2012-02-13 NOTE — ED Provider Notes (Signed)
History     CSN: [id number]  Arrival date & time 02/13/12  0207   First MD Initiated Contact with Patient 02/13/12 [phone]      Chief Complaint  Patient presents with  . Chest Pain    (Consider location/radiation/quality/duration/timing/severity/associated sxs/prior treatment) The history is provided by the patient.  51 y/o F with PMH of crack cocaine use, pericarditis (with hospital admission in January of this year), HTN presents to ED with c/c of sharp left sided CP that began around 2:30 AM and has been constant since that time. Pain occasionally radiates to left arm and there have been intermittent episodes of associated left arm tingling. There has also been intermittent nausea with 1 episode of non-bloody emesis. She endorses congestion and cough prod of yellow sputum for the last several days. Admits to crack cocaine use on Thursday evening. Denies any fever, diaphoresis, sore throat, SOB, abd pain, change in bowel habits, or weakness. Pt denies any personal or known family history of CAD. Denies hx PE, recent trip, estrogen use, or recent leg pain/swelling.  Past Medical History  Diagnosis Date  . 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 Followed by Dr. Louanne Skye  . Hypertension   . Cocaine abuse     ongoing with resultant vaculitis.  . Tobacco abuse   . Depression   . Normocytic anemia     BL Hgb 9.8-12. Last anemia panel 04/2010 - showing Fe 19, ferritin 101.  Pt on monthly B12 injections  . Rheumatoid arthritis     patient reported    Past Surgical History  Procedure Date  . Skin biopsy     bilateral shin nodules on 04/2010  . Hernia repair     Family History  Problem Relation Age of Onset  . Breast cancer Mother     Breast cancer  . Alcohol abuse Mother   . Colon cancer Maternal Aunt 34  . Alcohol abuse Father     History  Substance Use Topics  . Smoking status: Current Everyday Smoker -- 0.2 packs/day for 39 years    Types: Cigarettes  . Smokeless tobacco:  Never Used  . Alcohol Use: No     Review of Systems 10 systems reviewed and are negative for acute change except as noted in the HPI.  Allergies  Acetaminophen  Home Medications   Current Outpatient Rx  Name Route Sig Dispense Refill  . DIPHENHYDRAMINE HCL 25 MG PO CAPS Oral Take 25 mg by mouth every 6 (six) hours as needed. itching     . FLUOXETINE HCL 20 MG PO CAPS Oral Take 40 mg by mouth daily.    Marland Kitchen KETOROLAC TROMETHAMINE 10 MG PO TABS Oral Take 10 mg by mouth every 6 (six) hours as needed. For pain    . LISINOPRIL 20 MG PO TABS Oral Take 20 mg by mouth daily. For blood pressure     . MULTIVITAMINS PO CAPS Oral Take 1 capsule by mouth daily.      Marland Kitchen NAPROXEN 500 MG PO TABS Oral Take 1 tablet (500 mg total) by mouth 2 (two) times daily as needed. 30 tablet 0  . TRAMADOL HCL 50 MG PO TABS Oral Take 50 mg by mouth every 6 (six) hours as needed. For pain     . VITAMIN B-12 1000 MCG PO TABS Oral Take 1,000 mcg by mouth daily.      Marland Kitchen ZOLPIDEM TARTRATE 10 MG PO TABS Oral Take 10 mg by mouth at bedtime.  BP 149/89  Pulse 72  Temp(Src) 98.1 F (36.7 C) (Oral)  Resp 18  SpO2 97%  Physical Exam  Nursing note reviewed. Constitutional: She appears well-developed and well-nourished. No distress.       Vital signs are reviewed and are sig for HTN  HENT:  Head: Normocephalic and atraumatic.  Mouth/Throat: Oropharynx is clear and moist.       Bilateral TM normal  Eyes: Pupils are equal, round, and reactive to light.  Neck: Neck supple.  Cardiovascular: Normal rate and regular rhythm.   Pulses:      Radial pulses are 2+ on the right side, and 2+ on the left side.       Dorsalis pedis pulses are 2+ on the right side, and 2+ on the left side.       Systolic murmur  Pulmonary/Chest: Effort normal and breath sounds normal. No respiratory distress. She has no wheezes. She exhibits no tenderness.  Abdominal: Soft. Bowel sounds are normal. She exhibits no distension. There is no  tenderness. There is no guarding.  Musculoskeletal: She exhibits no edema and no tenderness.  Neurological: She is alert.       MS appropriate for pt and situation. Speech clear. MAEW without any obvious focal deficit. Sensation intact to light touch in all extremities bilaterally.  Skin: Skin is warm and dry.    ED Course  Procedures (including critical care time)  Labs Reviewed  CBC - Abnormal; Notable for the following:    WBC 3.7 (*)    Hemoglobin 11.9 (*)    HCT 34.2 (*)    RDW 15.6 (*)    All other components within normal limits  POCT I-STAT, CHEM 8 - Abnormal; Notable for the following:    Hemoglobin 11.6 (*)    HCT 34.0 (*)    All other components within normal limits  POCT I-STAT TROPONIN I  POCT I-STAT TROPONIN I  URINE RAPID DRUG SCREEN (HOSP PERFORMED)   Dg Chest 2 View  02/13/2012  *RADIOLOGY REPORT*  Clinical Data: Sharp constant stabbing pain with numbness in the left arm.  CHEST - 2 VIEW  Comparison: 12/03/2011  Findings: Normal heart size and pulmonary vascularity. Hyperinflation in the lungs.  Slight fibrosis in the upper lungs. Nodular opacities over the mid lungs likely to represent prominent nipple shadows.  These are stable.  No focal airspace consolidation.  No blunting of costophrenic angles.  No pneumothorax.  Calcification of the aorta.  IMPRESSION: No evidence of active pulmonary disease.  Original Report Authenticated By: Neale Burly, M.D.    Date: 02/13/2012  Rate: 64  Rhythm: normal sinus rhythm  QRS Axis: normal  Intervals: normal  ST/T Wave abnormalities: TWI in anterior leads  Conduction Disutrbances:none  Narrative Interpretation: prior ecg dated 12/03/2011. Biatrial enlargement and LVH unchanged, new TWI in leads V3, V4  Old EKG Reviewed: changes noted    1. Chest pain   2. Cocaine use   3. T wave inversion in EKG       MDM  CP atypical for ACS, constant pain with neg troponin x 2. EKG changes in the setting of cocaine use are  concerning. Spoke with Dr Terrence Dupont at 8:50 AM, who advises that further workup is warranted at this time. Pt will be admitted under medicine service with his consult. Spoke with Dr Guy Sandifer at 9:25AM regarding this patient. IMTS will see pt for admission, bed request placed.  Reviewed: Prior records, prior ECG, labs/ECG/CXr today.  Consults: cardiology, teaching  service  Medicine given in ED: 354m ASA  Dispo: admit        SOrlie Dakin PA-C 02/13/12 00321

## 2012-02-13 NOTE — ED Notes (Signed)
Pt reports left sided chest pain radiating into left arm that started approx 1 hour ago associated with nausea. Denies cocaine use.

## 2012-02-13 NOTE — H&P (Signed)
Date: 02/13/2012               Patient Name:  [name] MRN: [id number]  DOB: 20-Feb-1961 Age / Sex: 51 y.o., female   PCP: Charlann Lange, MD              Medical Service: Internal Medicine Teaching Service              Attending Physician: Dr. Milta Deiters    First Contact: Dr. Blaine Hamper Pager: [phone]  Second Contact: Dr. Guy Sandifer Pager: [phone]            After Hours (After 5p/  First Contact Pager: [phone]  weekends / holidays): Second Contact Pager: [phone]     Chief Complaint: Chest pain and left arm paresthesias.  History of Present Illness:  Patient is a 51 y.o. female with a PMHx of cocaine abuse (with resultant Levamisole-associated vasculitis), who presents to [facility] for evaluation of left sided chest pain and left arm paresthesias  Patient reports that she smoked cocaine at 12:00AM-1:00AM at the night before admission. At 2:30 AM on the morning of admission when she was sitting and talking with friends, she started having chest pain.  Since time of onset, pain unchanged course since that time. The patient describes the pain as constant, sharp in nature, radiates to the left arm. 8/10 in severity. Associated with dyspnea and palpitations. Aggravated by coughing and deep inspiration.  Alleviated by resting.  He experienced vomiting x 2, of a bilious vomitus subsequently, but no diarrhea or abdominal pain.    Patient also report having numbness and pain in her left hand and arm. It started at the left hand and then involved the upper arm at lateral aspect. She also noticed 3 painful nodules, one on her forehead, and two are on left and right wrists.   Denies any fever, but has diaphoresis and chills. No weakness. She did not have recent long distant traveling, no pain in calf areas.   Current Outpatient Medications: Current Facility-Administered Medications  Medication Dose Route Frequency Provider Last Rate Last Dose  . aspirin chewable tablet 324 mg  324 mg Oral Once  Orlie Dakin, PA-C   324 mg at 02/13/12 5537   Current Outpatient Prescriptions  Medication Sig Dispense Refill  . diphenhydrAMINE (BENADRYL) 25 mg capsule Take 25 mg by mouth every 6 (six) hours as needed. itching       . FLUoxetine (PROZAC) 20 MG capsule Take 40 mg by mouth daily.      Marland Kitchen ketorolac (TORADOL) 10 MG tablet Take 10 mg by mouth every 6 (six) hours as needed. For pain      . lisinopril (PRINIVIL,ZESTRIL) 20 MG tablet Take 20 mg by mouth daily. For blood pressure       . Multiple Vitamin (MULTIVITAMIN) capsule Take 1 capsule by mouth daily.        . naproxen (NAPROSYN) 500 MG tablet Take 1 tablet (500 mg total) by mouth 2 (two) times daily as needed.  30 tablet  0  . traMADol (ULTRAM) 50 MG tablet Take 50 mg by mouth every 6 (six) hours as needed. For pain       . vitamin B-12 (CYANOCOBALAMIN) 1000 MCG tablet Take 1,000 mcg by mouth daily.        Marland Kitchen zolpidem (AMBIEN) 10 MG tablet Take 10 mg by mouth at bedtime.           Allergies: Allergies  Allergen Reactions  . Acetaminophen  REACTION: eyelid swelling     Past Medical History: Past Medical History  Diagnosis Date  . 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 Followed by Dr. Louanne Skye  . Hypertension   . Cocaine abuse     ongoing with resultant vaculitis.  . Tobacco abuse   . Depression   . Normocytic anemia     BL Hgb 9.8-12. Last anemia panel 04/2010 - showing Fe 19, ferritin 101.  Pt on monthly B12 injections  . Rheumatoid arthritis     patient reported    Past Surgical History: Past Surgical History  Procedure Date  . Skin biopsy     bilateral shin nodules on 04/2010  . Hernia repair     Family History: Family History  Problem Relation Age of Onset  . Breast cancer Mother     Breast cancer  . Alcohol abuse Mother   . Colon cancer Maternal Aunt 11  . Alcohol abuse Father     Social History:  Single, living at [place] [facility] (assistant living facility); unimployed.  Disabled since 2011  due to her rheumatoid arthritis,  smokes 0.2 PAD for 39 years. No drinking alcohol. Use Levamisole and cocaine (last use was the night before admission). Denies IV drug use. Year of Education: 11 garade.   Insurance:  Medicaid.   Vital Signs:  Blood pressure 121/93, pulse 57, temperature 98.1 F,  Temperature 98.1, RR 17, O2Sat. 100.00%.  General: resting in bed, not in acute distress HEENT: PERRL, EOMI, no scleral icterus. Swelling, red and and tender over left ear. Cardiac: S1/S2, RRR, No murmurs, gallops or rubs Pulm: Good air movement bilaterally, Clear to auscultation bilaterally, No rales, wheezing, rhonchi or rubs. Abd: Soft,  nondistended, nontender, no rebound pain, no organomegaly, BS present Ext: No edema, 2+DP/PT pulse bilaterally. Swelling and tender over left 4th PIP joint without redness. Skin: one 1.5 cm painful nodule on the forehead. A small painful nodule on the left and right wrists. No skin color change over the nodules. Neuro: alert and oriented X3, cranial nerves II-XII grossly intact, muscle strength 5/5 in all extremeties,  sensation to light touch intact.   Lab results: Basic Metabolic Panel:  [facility] 02/13/12 0252  NA 143  K 3.7  CL 107  CO2 --  GLUCOSE 99  BUN 15  CREATININE 0.90  CALCIUM --  MG --  PHOS --    CBC:  Basename 02/13/12 0252 02/13/12 0230  WBC -- 3.7*  NEUTROABS -- --  HGB 11.6* 11.9*  HCT 34.0* 34.2*  MCV -- 84.2  PLT -- 233   Imaging results:  Dg Chest 2 View  02/13/2012  *RADIOLOGY REPORT*  Clinical Data: Sharp constant stabbing pain with numbness in the left arm.  CHEST - 2 VIEW  Comparison: 12/03/2011  Findings: Normal heart size and pulmonary vascularity. Hyperinflation in the lungs.  Slight fibrosis in the upper lungs. Nodular opacities over the mid lungs likely to represent prominent nipple shadows.  These are stable.  No focal airspace consolidation.  No blunting of costophrenic angles.  No pneumothorax.  Calcification  of the aorta.  IMPRESSION: No evidence of active pulmonary disease.  Original Report Authenticated By: Neale Burly, M.D.     Other results:  EKG (02/13/2012) - sinus rhythm, regular, normal Axis, normal R wave progression. Bilateral Atrial enlargemtn, T wave inversion over V2 to V4  Assessment & Plan:  Pt is a 51 y.o. yo female with a PMHx of cocaine abuse (with resultant Levamisole-associated  vasculitis), who presents with chest pain and left hand and arm numbness.   # . Chest pain: most likely due to cocaine-induced coronary ischemia. Her symptoms started after cocaine use. Her EKG showed T wave inversion over V2 to V4 which is worse than her previous EKG (12/03/11). Troponin negative X 1. Patient also has some risk factors for coronary artery disease, including smoking, cocaine use, HTN.  Differential diagnosis for her chest pain include, but less likely PE (Revised Geneva score is 0), pneumothorax (normal CXR),  esophageal spasm ( no dysphagia or other), pneumonia (norma CXR), musculoskeletal chest pain, GERD, and anxiety.  another possibility is pericarditis which can not be ruled out competently now. Patient had history of pericarditis. Her chest pain is somewhat pleuritic in nature.   Plan:               - Admit to telemetry             - cycle CE q8 x3 and repeat her EKG in the am             - Nitroglycerin and aspirin - Avoid beta blocker.             - Risk factor stratification: FLP,  A1C and TSH - ED consulted Cardiology. Will follow up Recommendations.   # Vasculitis associated with crack cocaine use. She had severe pain in her ears, most likely due to vasculitis. Will treat with oral prednison  # Polysubstance abuse: admmited cocaine use on admission. Patient reports that her friends around her all use cocaine which makes it difficult for her to stay away from drugs. She was encouraged to avoid all exposure to drugs.   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   Thought be as secondary  to levamisole-induced toxicity according to previous discharge summary. Will treat with prednisone.  # left arm numbness and pain: unclear etiology. There is no swelling or redness over her left arm. 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 Currently no weakness.   -Will observe closely -consider gabapatin if get worse.  # Subcutaneous  Nodules : unclear etiology. Patient has similar presentation in 04/2010. She had  multiple painful skin nodules in her extremities on that amission. Culture did not show any bacteria. It was thought to be levamisole induced vasculitis. Differential diagonsis includes cocaine-induced necrotizing granulomatous vasculitis (destruction of the vascular system by granuloma).  -Will treat with prednisone  -will monitor for any signs of infection.    DVT PPX - Lovenox   Ivor Costa, MD PGY1, Internal Medicine Teaching Service Pager: [phone]   Chilton Greathouse, [place], Internal Medicine Resident 02/13/2012, 2:35 PM

## 2012-02-14 DIAGNOSIS — R079 Chest pain, unspecified: Secondary | ICD-10-CM

## 2012-02-14 LAB — CARDIAC PANEL(CRET KIN+CKTOT+MB+TROPI)
CK, MB: 4.9 ng/mL — ABNORMAL HIGH (ref 0.3–4.0)
CK, MB: 4.1 ng/mL — ABNORMAL HIGH (ref 0.3–4.0)
Relative Index: 2.6 — ABNORMAL HIGH (ref 0.0–2.5)
Relative Index: 3 — ABNORMAL HIGH (ref 0.0–2.5)
Total CK: 138 U/L (ref 7–177)
Troponin I: <0.3 ng/mL (ref <0.30)
Troponin I: <0.3 ng/mL (ref <0.30)

## 2012-02-14 LAB — LIPID PANEL
Cholesterol: 200 mg/dL (ref 0–200)
Triglycerides: 54 mg/dL (ref <150)
VLDL: 11 mg/dL (ref 0–40)

## 2012-02-14 MED ORDER — AMLODIPINE BESYLATE 2.5 MG PO TABS
2.5000 mg | ORAL_TABLET | Freq: Every day | ORAL | Status: DC
  Administered 2012-02-14 – 2012-02-15 (×2): 2.5 mg via ORAL
  Filled 2012-02-14 (×2): qty 1

## 2012-02-14 MED ORDER — POTASSIUM CHLORIDE CRYS ER 20 MEQ PO TBCR
20.0000 meq | EXTENDED_RELEASE_TABLET | ORAL | Status: AC
  Administered 2012-02-14 (×2): 20 meq via ORAL
  Filled 2012-02-14: qty 2

## 2012-02-14 MED ORDER — ISOSORBIDE MONONITRATE ER 30 MG PO TB24
30.0000 mg | ORAL_TABLET | Freq: Every day | ORAL | Status: DC
  Administered 2012-02-14 – 2012-02-15 (×2): 30 mg via ORAL
  Filled 2012-02-14 (×2): qty 1

## 2012-02-14 MED ORDER — POTASSIUM CHLORIDE CRYS ER 20 MEQ PO TBCR
EXTENDED_RELEASE_TABLET | ORAL | Status: AC
  Administered 2012-02-14: 20 meq
  Filled 2012-02-14: qty 2

## 2012-02-14 NOTE — Consult Note (Signed)
Reason for Consult: Chest pain left arm pain/cocaine abuse Referring Physician: Dr. Imagene Sheller is an 51 y.o. female.  HPI: Patient is 51 year old female with past medical history significant for hypertension, vasculitis, history of polysubstance abuse including cocaine abuse, rheumatoid arthritis, anemia was admitted yesterday because of left-sided chest pain radiating to left arm which started around 2 AM early Saturday morning associated with nausea vomiting and mild shortness of breath patient states chest pain was sharp and stabbing in nature grade 9/10 which started approximately 6 hours after smoking cocaine. EKG done in the ER showed normal sinus rhythm with LVH with new ST-T wave changes in anteroseptal leads. MI is ruled out by serial enzymes but patient continues to have vague chest pain and left arm pain off and on. Patient denies any palpitation lightheadedness or syncope. Patient states she has been doing drugs since age 66 and intends to stop it.  Past Medical History  Diagnosis Date  . 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 Followed by Dr. Louanne Skye  . Hypertension   . Cocaine abuse     ongoing with resultant vaculitis.  . Tobacco abuse   . Depression   . Normocytic anemia     BL Hgb 9.8-12. Last anemia panel 04/2010 - showing Fe 19, ferritin 101.  Pt on monthly B12 injections  . Rheumatoid arthritis     patient reported    Past Surgical History  Procedure Date  . Skin biopsy     bilateral shin nodules on 04/2010  . Hernia repair     Family History  Problem Relation Age of Onset  . Breast cancer Mother     Breast cancer  . Alcohol abuse Mother   . Colon cancer Maternal Aunt 64  . Alcohol abuse Father     Social History:  reports that she has been smoking Cigarettes.  She has a 9.75 pack-year smoking history. She has never used smokeless tobacco. She reports that she uses illicit drugs ("Crack" cocaine, Marijuana, and Cocaine). She reports that she does not  drink alcohol.  Allergies:  Allergies  Allergen Reactions  . Acetaminophen     REACTION: eyelid swelling    Medications: I have reviewed the patient's current medications.  Results for orders placed during the hospital encounter of 02/13/12 (from the past 48 hour(s))  CBC     Status: Abnormal   Collection Time   02/13/12  2:30 AM      Component Value Range Comment   WBC 3.7 (*) 4.0 - 10.5 (K/uL)    RBC 4.06  3.87 - 5.11 (MIL/uL)    Hemoglobin 11.9 (*) 12.0 - 15.0 (g/dL)    HCT 34.2 (*) 36.0 - 46.0 (%)    MCV 84.2  78.0 - 100.0 (fL)    MCH 29.3  26.0 - 34.0 (pg)    MCHC 34.8  30.0 - 36.0 (g/dL)    RDW 15.6 (*) 11.5 - 15.5 (%)    Platelets 233  150 - 400 (K/uL)   POCT I-STAT TROPONIN I     Status: Normal   Collection Time   02/13/12  2:47 AM      Component Value Range Comment   Troponin i, poc 0.00  0.00 - 0.08 (ng/mL)    Comment 3            POCT I-STAT, CHEM 8     Status: Abnormal   Collection Time   02/13/12  2:52 AM  Component Value Range Comment   Sodium 143  135 - 145 (mEq/L)    Potassium 3.7  3.5 - 5.1 (mEq/L)    Chloride 107  96 - 112 (mEq/L)    BUN 15  6 - 23 (mg/dL)    Creatinine, Ser 0.90  0.50 - 1.10 (mg/dL)    Glucose, Bld 99  70 - 99 (mg/dL)    Calcium, Ion 1.26  1.12 - 1.32 (mmol/L)    TCO2 26  0 - 100 (mmol/L)    Hemoglobin 11.6 (*) 12.0 - 15.0 (g/dL)    HCT 34.0 (*) 36.0 - 46.0 (%)   POCT I-STAT TROPONIN I     Status: Normal   Collection Time   02/13/12  8:15 AM      Component Value Range Comment   Troponin i, poc 0.00  0.00 - 0.08 (ng/mL)    Comment 3            URINE RAPID DRUG SCREEN (HOSP PERFORMED)     Status: Abnormal   Collection Time   02/13/12  9:01 AM      Component Value Range Comment   Opiates NONE DETECTED  NONE DETECTED     Cocaine POSITIVE (*) NONE DETECTED     Benzodiazepines NONE DETECTED  NONE DETECTED     Amphetamines NONE DETECTED  NONE DETECTED     Tetrahydrocannabinol POSITIVE (*) NONE DETECTED     Barbiturates NONE  DETECTED  NONE DETECTED    OCCULT BLOOD, POC DEVICE     Status: Normal   Collection Time   02/13/12 11:15 AM      Component Value Range Comment   Fecal Occult Bld NEGATIVE     CARDIAC PANEL(CRET KIN+CKTOT+MB+TROPI)     Status: Abnormal   Collection Time   02/13/12  4:13 PM      Component Value Range Comment   Total CK 222 (*) 7 - 177 (U/L)    CK, MB 5.2 (*) 0.3 - 4.0 (ng/mL)    Troponin I <0.30  <0.30 (ng/mL)    Relative Index 2.3  0.0 - 2.5    PROTIME-INR     Status: Normal   Collection Time   02/13/12  4:14 PM      Component Value Range Comment   Prothrombin Time 14.2  11.6 - 15.2 (seconds)    INR 1.08  0.00 - 1.49    APTT     Status: Normal   Collection Time   02/13/12  4:14 PM      Component Value Range Comment   aPTT 35  24 - 37 (seconds)   CBC     Status: Abnormal   Collection Time   02/13/12  4:14 PM      Component Value Range Comment   WBC 3.0 (*) 4.0 - 10.5 (K/uL)    RBC 4.03  3.87 - 5.11 (MIL/uL)    Hemoglobin 11.6 (*) 12.0 - 15.0 (g/dL)    HCT 34.2 (*) 36.0 - 46.0 (%)    MCV 84.9  78.0 - 100.0 (fL)    MCH 28.8  26.0 - 34.0 (pg)    MCHC 33.9  30.0 - 36.0 (g/dL)    RDW 15.4  11.5 - 15.5 (%)    Platelets 219  150 - 400 (K/uL)   DIFFERENTIAL     Status: Abnormal   Collection Time   02/13/12  4:14 PM      Component Value Range Comment   Neutrophils Relative 49  43 -  77 (%)    Neutro Abs 1.5 (*) 1.7 - 7.7 (K/uL)    Lymphocytes Relative 47 (*) 12 - 46 (%)    Lymphs Abs 1.4  0.7 - 4.0 (K/uL)    Monocytes Relative 1 (*) 3 - 12 (%)    Monocytes Absolute 0.0 (*) 0.1 - 1.0 (K/uL)    Eosinophils Relative 2  0 - 5 (%)    Eosinophils Absolute 0.1  0.0 - 0.7 (K/uL)    Basophils Relative 0  0 - 1 (%)    Basophils Absolute 0.0  0.0 - 0.1 (K/uL)   TSH     Status: Normal   Collection Time   02/13/12  4:14 PM      Component Value Range Comment   TSH 0.667  0.350 - 4.500 (uIU/mL)   COMPREHENSIVE METABOLIC PANEL     Status: Abnormal   Collection Time   02/13/12  4:14 PM       Component Value Range Comment   Sodium 140  135 - 145 (mEq/L)    Potassium 3.4 (*) 3.5 - 5.1 (mEq/L)    Chloride 106  96 - 112 (mEq/L)    CO2 25  19 - 32 (mEq/L)    Glucose, Bld 106 (*) 70 - 99 (mg/dL)    BUN 16  6 - 23 (mg/dL)    Creatinine, Ser 0.94  0.50 - 1.10 (mg/dL)    Calcium 9.2  8.4 - 10.5 (mg/dL)    Total Protein 6.4  6.0 - 8.3 (g/dL)    Albumin 3.2 (*) 3.5 - 5.2 (g/dL)    AST 24  0 - 37 (U/L)    ALT 15  0 - 35 (U/L)    Alkaline Phosphatase 74  39 - 117 (U/L)    Total Bilirubin 0.2 (*) 0.3 - 1.2 (mg/dL)    GFR calc non Af Amer 71 (*) >90 (mL/min)    GFR calc Af Amer 82 (*) >90 (mL/min)   HEMOGLOBIN A1C     Status: Abnormal   Collection Time   02/13/12  4:14 PM      Component Value Range Comment   Hemoglobin A1C 5.8 (*) <5.7 (%)    Mean Plasma Glucose 120 (*) <117 (mg/dL)   LIPASE, BLOOD     Status: Normal   Collection Time   02/13/12  4:14 PM      Component Value Range Comment   Lipase 28  11 - 59 (U/L)   MRSA PCR SCREENING     Status: Normal   Collection Time   02/13/12  5:16 PM      Component Value Range Comment   MRSA by PCR NEGATIVE  NEGATIVE    CARDIAC PANEL(CRET KIN+CKTOT+MB+TROPI)     Status: Abnormal   Collection Time   02/14/12  2:01 AM      Component Value Range Comment   Total CK 185 (*) 7 - 177 (U/L)    CK, MB 4.9 (*) 0.3 - 4.0 (ng/mL)    Troponin I <0.30  <0.30 (ng/mL)    Relative Index 2.6 (*) 0.0 - 2.5    LIPID PANEL     Status: Abnormal   Collection Time   02/14/12  6:37 AM      Component Value Range Comment   Cholesterol 200  0 - 200 (mg/dL)    Triglycerides 54  <150 (mg/dL)    HDL 64  >39 (mg/dL)    Total CHOL/HDL Ratio 3.1      VLDL 11  0 - 40 (mg/dL)    LDL Cholesterol 125 (*) 0 - 99 (mg/dL)   CARDIAC PANEL(CRET KIN+CKTOT+MB+TROPI)     Status: Abnormal   Collection Time   02/14/12  9:36 AM      Component Value Range Comment   Total CK 138  7 - 177 (U/L)    CK, MB 4.1 (*) 0.3 - 4.0 (ng/mL)    Troponin I <0.30  <0.30 (ng/mL)    Relative  Index 3.0 (*) 0.0 - 2.5      Dg Chest 2 View  02/13/2012  *RADIOLOGY REPORT*  Clinical Data: Sharp constant stabbing pain with numbness in the left arm.  CHEST - 2 VIEW  Comparison: 12/03/2011  Findings: Normal heart size and pulmonary vascularity. Hyperinflation in the lungs.  Slight fibrosis in the upper lungs. Nodular opacities over the mid lungs likely to represent prominent nipple shadows.  These are stable.  No focal airspace consolidation.  No blunting of costophrenic angles.  No pneumothorax.  Calcification of the aorta.  IMPRESSION: No evidence of active pulmonary disease.  Original Report Authenticated By: Neale Burly, M.D.    Review of Systems  Constitutional: Negative for fever, chills and weight loss.  Eyes: Negative for blurred vision and double vision.  Respiratory: Negative for cough, hemoptysis and sputum production.   Cardiovascular: Positive for chest pain. Negative for palpitations, orthopnea and claudication.  Gastrointestinal: Positive for nausea and vomiting. Negative for abdominal pain.  Neurological: Negative for dizziness and headaches.   Blood pressure 142/70, pulse 49, temperature 97.5 F (36.4 C), temperature source Oral, resp. rate 18, height 5' 1"  (1.549 m), weight 44.5 kg (98 lb 1.7 oz), SpO2 100.00%. Physical Exam  Constitutional: She is oriented to person, place, and time.  HENT:  Head: Normocephalic and atraumatic.  Nose: Nose normal.  Mouth/Throat: No oropharyngeal exudate.  Eyes: Conjunctivae are normal. Pupils are equal, round, and reactive to light. Left eye exhibits no discharge. No scleral icterus.  Neck: Normal range of motion. Neck supple. No JVD present. No tracheal deviation present. No thyromegaly present.  Cardiovascular: Normal rate, regular rhythm, normal heart sounds and intact distal pulses.  Exam reveals no gallop and no friction rub.   No murmur heard. Respiratory: Effort normal and breath sounds normal. No respiratory distress.  She has no wheezes. She has no rales. She exhibits no tenderness.  GI: Soft. Bowel sounds are normal. She exhibits no distension. There is no tenderness. There is no rebound and no guarding.  Musculoskeletal: She exhibits no edema and no tenderness.  Neurological: She is alert and oriented to person, place, and time.    Assessment/Plan: Atypical chest pain/left arm pain with EKG changes probably secondary to cocaine-induced vasospasm MI ruled out Hypertension Polysubstance abuse History of vasculitis History of rheumatoid arthritis Anemia Plan Add low-dose nitrates and Ca channel blockers Hold ACE inhibitors and nonsteroidal anti-inflammatory meds for now Drugs Cessation consult Will schedule for nuclear stress test in a.m.   Kristine Chahal N 02/14/2012, 2:10 PM

## 2012-02-14 NOTE — Discharge Summary (Signed)
Patient Name:  [name]  MRN: [id number]  PCP: Charlann Lange, MD, MD  DOB:  03/08/1961       Date of Admission:  02/13/2012  Date of Discharge:  02/15/2012      Attending Physician: Dr. Milta Deiters, MD        DISCHARGE DIAGNOSES: 1. Active Problems: 2.  SUBSTANCE ABUSE, MULTIPLE 3.  ESSENTIAL HYPERTENSION 4.  VASCULITIS 5.  Chest pain 6.  Depression 7.  Hand pain, left  DISPOSITION AND FOLLOW-UP: [name] is to follow-up with the listed providers as detailed below. In her visit, please address following issus:  1 Continue to educate her for quitting drugs. 2. Make sure that she does not have new episode of chest pain. 3. Please check her blood pressure, and make an adjustment of her HTN medications accordingly.    Follow-up Information    Follow up with [facility], MD on 03/01/2012. (@ 2:45 PM - please call the clinic if you are unable to make your appointment and need to reschedule)    Contact information:   [place] [phone]         Discharge Orders    Future Appointments: Provider: Department: Dept Phone: Center:   03/01/2012 2:45 PM Jolene Provost, MD [facility] [phone] [facility]     Future Orders Please Complete By Expires   Diet - low sodium heart healthy      Increase activity slowly      Call MD for:  temperature >100.4      Call MD for:  severe uncontrolled pain      Call MD for:  persistant dizziness or light-headedness        DISCHARGE MEDICATIONS: Medication List  As of 02/15/2012  2:05 PM   STOP taking these medications         naproxen 500 MG tablet         TAKE these medications         amLODipine 2.5 MG tablet   Commonly known as: NORVASC   Take 1 tablet (2.5 mg total) by mouth daily.      aspirin 81 MG EC tablet   Take 1 tablet (81 mg total) by mouth daily.      diphenhydrAMINE 25 mg capsule   Commonly known as: BENADRYL   Take 25 mg by mouth every 6 (six) hours as needed.  itching      FLUoxetine 20 MG capsule   Commonly known as: PROZAC   Take 40 mg by mouth daily.      ketorolac 10 MG tablet   Commonly known as: TORADOL   Take 10 mg by mouth every 6 (six) hours as needed. For pain      lisinopril 20 MG tablet   Commonly known as: PRINIVIL,ZESTRIL   Take 20 mg by mouth daily. For blood pressure      multivitamin capsule   Take 1 capsule by mouth daily.      predniSONE 10 MG tablet   Commonly known as: DELTASONE   Take 0.5 tablets (5 mg total) by mouth daily.      traMADol 50 MG tablet   Commonly known as: ULTRAM   Take 1 tablet (50 mg total) by mouth every 6 (six) hours as needed. For pain      vitamin B-12 1000 MCG tablet   Commonly known as: CYANOCOBALAMIN   Take 1,000 mcg by mouth daily.  zolpidem 10 MG tablet   Commonly known as: AMBIEN   Take 1 tablet (10 mg total) by mouth at bedtime.             CONSULTS:     Cardiology  PROCEDURES PERFORMED:   Nuclear Stress test was performed on 02/15/12: formal report was not issued at discharge. But Dr. Ardeen Garland called me and told that patient's stress test was negative, no ischemia.   Dg Chest 2 View  02/13/2012  *RADIOLOGY REPORT*  Clinical Data: Sharp constant stabbing pain with numbness in the left arm.  CHEST - 2 VIEW  Comparison: 12/03/2011  Findings: Normal heart size and pulmonary vascularity. Hyperinflation in the lungs.  Slight fibrosis in the upper lungs. Nodular opacities over the mid lungs likely to represent prominent nipple shadows.  These are stable.  No focal airspace consolidation.  No blunting of costophrenic angles.  No pneumothorax.  Calcification of the aorta.  IMPRESSION: No evidence of active pulmonary disease.  Original Report Authenticated By: Neale Burly, M.D.      ADMISSION DATA: H&P:  Patient is a 51 y.o. 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 At 2:30 AM on the morning of admission when she was sitting and talking with friends, she started having chest pain.  Since time of onset, pain unchanged course since that time. The patient describes the pain as constant, sharp in nature, radiates to the left arm. 8/10 in severity. Associated with dyspnea and palpitations. Aggravated by coughing and deep inspiration.  Alleviated by resting.  He experienced vomiting x 2, of a bilious vomitus subsequently, but no diarrhea or abdominal pain.    Patient also report having numbness and pain in her left hand and arm. It started at the left hand and then involved the upper arm at lateral aspect. She also noticed 3 painful nodules, one on her forehead, and two are on left and right wrists.   Denies any fever, but has diaphoresis and chills. No weakness. She did not have recent long distant traveling, no pain in calf areas.   Physical Exam.:   Vital Signs: Blood pressure 121/93, pulse 57, temperature 98.1 F,  Temperature 98.1, RR 17, O2Sat. 100.00%.  General: resting in bed, not in acute distress HEENT: PERRL, EOMI, no scleral icterus. Swelling, red and and tender over left ear. Cardiac: S1/S2, RRR, No murmurs, gallops or rubs Pulm: Good air movement bilaterally, Clear to auscultation bilaterally, No rales, wheezing, rhonchi or rubs. Abd: Soft,  nondistended, nontender, no rebound pain, no organomegaly, BS present Ext: No edema, 2+DP/PT pulse bilaterally. Swelling and tender over left 4th PIP joint without redness. Skin: one 1.5 cm painful nodule on the forehead. A small painful nodule on the left and right wrists. No skin color change over the nodules. Neuro: alert and oriented X3, cranial nerves II-XII grossly intact, muscle strength 5/5 in all extremeties,  sensation to light touch intact.   Lab results: Basic Metabolic  Panel:  Basename  02/13/12 0252   NA  143   K  3.7   CL  107   CO2  --   GLUCOSE  99   BUN  15   CREATININE  0.90   CALCIUM  --   MG  --   PHOS  --  CBC:  Basename  02/13/12 0252  02/13/12 0230   WBC  --  3.7*   NEUTROABS  --  --   HGB  11.6*  11.9*   HCT  34.0*  34.2*   MCV  --  84.2   PLT  --  233     EKG (02/13/2012) - sinus rhythm, regular, normal Axis, normal R wave progression. Bilateral Atrial enlargemtn, T wave inversion over V2 to V4  HOSPITAL COURSE:  # Chest pain: most likely due to cocaine-induced coronary ischemia. Her symptoms started after cocaine use. Her EKG showed T wave inversion over V2 to V4 which is worse than her previous EKG (12/03/11). Troponin negative. Patient also has some risk factors for coronary artery disease, including smoking, cocaine use, HTN.  Differential diagnosis for her chest pain include, but less likely PE (Revised Geneva score is 0), pneumothorax (normal CXR),  esophageal spasm ( no dysphagia or other), pneumonia (norma CXR), musculoskeletal chest pain, GERD, and anxiety.  Another possibility is pericarditis (less likely,  patient's EKG did not have typical changes for pericarditis, such as PR segment depression in aVR, diffused ST elevation and electrical alteran.  Also she does not have rubs on auscultation). Patient was treated with Nitrostat, imdur, amlodipine, ASA. Did risk factor stratification.  TSH 0.667. A1c 5.8, LDL 125. Her cardiovascular risk score is 2 (HTN and smoking). So we did not start statin. Cardiology was consulted. Nuclear Stress test was performed, which is negative (called by Dr. Ardeen Garland, the test is negative). She is discharged at stable condition on low dose of amlodipine and ASA. She is to follow up at clinic on 5/28.  # HTN: on admission, we held her Lisinopril, since we treated patient with imdur, amlodipin and nitrostat for cocaine-induced chest pain, which should also help her HTN. At discharge, I continued her  lisinopril and amlodipine. Her HTN medications need to be adjusted at her follow up visit.   # Vasculitis associated with crack cocaine use. She had severe pain and swelling in left ear,  which is similar to her previous presentation 2/2 cocaine use. Most likely due to vasculitis. She responded to the oral prednisone treatment well. At discharge, her ear pain and swelling resolved. Will taper prednisone to 5 mg for 3 days.  # Polysubstance abuse: admmited cocaine use on admission. Patient reports that her friends around her all use cocaine which makes it difficult for her to stay away from drugs. She was encouraged to avoid all exposure to drugs.   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   Thought be as secondary to levamisole-induced toxicity according to previous discharge summary. She responded to the oral prednisone treatment well. At discharge, her hand pain is resolved. Per cardiology, all NSIADs were held before Nuclear Stress test is performed. Since stress test is negative, patient is discharged on home dose of Katorolac plus tapering dose of prednisone 5 mg for 3 days. Her Naproxen is discontinued at discharge.  # left arm numbness and pain: unclear etiology. There is no swelling or redness over her left arm. There is no weakness. It could be due to cocaine-induced peripheral neuropathy or vasclulitis. At discharge, her numbness resolved.   #  Subcutaneous  Nodules : unclear etiology. Patient has similar presentation in 04/2010. She had  multiple painful skin nodules in her extremities on that amission. Culture did not show any bacteria. It was thought to be levamisole induced vasculitis. She responded to the oral prednisone treatment well. At discharge, her subcutaneous  nodules resolved.  DISCHARGE DATA: Vital Signs: BP 156/87  Pulse 70  Temp(Src) 97.8 F (36.6 C) (Oral)  Resp 16  Ht 5' 1"  (1.549 m)  Wt 98 lb 1.7 oz (44.5 kg)  BMI 18.54 kg/m2  SpO2 100%  Labs: Results for orders placed during  the hospital encounter of 02/13/12 (from the past 24 hour(s))  VITAMIN B12     Status: Abnormal   Collection Time   02/15/12  5:32 AM      Component Value Range   Vitamin B-12 1139 (*) 211 - 911 (pg/mL)  FOLATE     Status: Normal   Collection Time   02/15/12  5:32 AM      Component Value Range   Folate 17.0    IRON AND TIBC     Status: Normal   Collection Time   02/15/12  5:32 AM      Component Value Range   Iron 74  42 - 135 (ug/dL)   TIBC 304  250 - 470 (ug/dL)   Saturation Ratios 24  20 - 55 (%)   UIBC 230  125 - 400 (ug/dL)  FERRITIN     Status: Normal   Collection Time   02/15/12  5:32 AM      Component Value Range   Ferritin 22  10 - 291 (ng/mL)  RETICULOCYTES     Status: Normal   Collection Time   02/15/12  5:32 AM      Component Value Range   Retic Ct Pct 1.6  0.4 - 3.1 (%)   RBC. 4.44  3.87 - 5.11 (MIL/uL)   Retic Count, Manual 71.0  19.0 - 186.0 (K/uL)  BASIC METABOLIC PANEL     Status: Normal   Collection Time   02/15/12  5:32 AM      Component Value Range   Sodium 138  135 - 145 (mEq/L)   Potassium 4.1  3.5 - 5.1 (mEq/L)   Chloride 104  96 - 112 (mEq/L)   CO2 26  19 - 32 (mEq/L)   Glucose, Bld 85  70 - 99 (mg/dL)   BUN 15  6 - 23 (mg/dL)   Creatinine, Ser 0.69  0.50 - 1.10 (mg/dL)   Calcium 9.3  8.4 - 10.5 (mg/dL)   GFR calc non Af Amer >90  >90 (mL/min)   GFR calc Af Amer >90  >90 (mL/min)  CBC     Status: Abnormal   Collection Time   02/15/12  5:32 AM      Component Value Range   WBC 5.7  4.0 - 10.5 (K/uL)   RBC 4.30  3.87 - 5.11 (MIL/uL)   Hemoglobin 12.5  12.0 - 15.0 (g/dL)   HCT 36.7  36.0 - 46.0 (%)   MCV 85.3  78.0 - 100.0 (fL)   MCH 29.1  26.0 - 34.0 (pg)   MCHC 34.1  30.0 - 36.0 (g/dL)   RDW 15.6 (*) 11.5 - 15.5 (%)   Platelets 255  150 - 400 (K/uL)  DIFFERENTIAL     Status: Abnormal   Collection Time   02/15/12  5:32 AM      Component Value Range   Neutrophils Relative 49  43 - 77 (%)   Neutro Abs 2.8  1.7 - 7.7 (K/uL)   Lymphocytes  Relative 48 (*) 12 - 46 (%)   Lymphs Abs 2.7  0.7 - 4.0 (K/uL)   Monocytes Relative 2 (*) 3 - 12 (%)   Monocytes Absolute 0.1  0.1 - 1.0 (K/uL)   Eosinophils Relative 1  0 - 5 (%)   Eosinophils Absolute 0.1  0.0 - 0.7 (K/uL)   Basophils Relative 0  0 - 1 (%)   Basophils Absolute 0.0  0.0 - 0.1 (K/uL)  GLUCOSE, CAPILLARY     Status: Normal   Collection Time   02/15/12  7:25 AM      Component Value Range   Glucose-Capillary 86  70 - 99 (mg/dL)  GLUCOSE, CAPILLARY     Status: Abnormal   Collection Time   02/15/12  1:14 PM      Component Value Range   Glucose-Capillary 105 (*) 70 - 99 (mg/dL)     Time Spent on Discharge: 35 min   Signed: Ivor Costa, MD PGY I, Internal Medicine Resident 02/15/2012, 2:05 PM

## 2012-02-14 NOTE — Progress Notes (Signed)
Clinical Social Work Department BRIEF PSYCHOSOCIAL ASSESSMENT 02/14/2012  Patient:  [name], [name]     Account Number:  [id number]     Admit date:  02/13/2012  Clinical Social Worker:  Robbi Garter  Date/Time:  02/14/2012 04:00 PM  Referred by:  Physician  Date Referred:  02/14/2012 Referred for  Other - See comment   Other Referral:   From facility   Interview type:  Patient Other interview type:    PSYCHOSOCIAL DATA Living Status:  FACILITY Admitted from facility:  [facility] Level of care:  Assisted Living Primary support name:  Verdon Cummins Primary support relationship to patient:  FRIEND Degree of support available:   Adequate, per pt    CURRENT CONCERNS Current Concerns  Post-Acute Placement  Substance Abuse   Other Concerns:    SOCIAL WORK ASSESSMENT / PLAN CSW met with pt re: current SA and d/c plan. Pt reports she was visiting with friends when she started having chest pain. Pt denies using cocaine on day of admission, but admits occassional "crack" use. Pt reports she has been using for over 15 years and knows she needs to stop "but it's hard." Pt declined SA tx resources. Pt admitted from [facility] and plan is for return at d/c. FL2 completed and on chart for MD signature. Pt reports she will use [facility] for transport to [employer] [facility].   Assessment/plan status:  Information/Referral to [employer] Other assessment/ plan:   Information/referral to community resources:   ALF    PATIENT'S/FAMILY'S RESPONSE TO PLAN OF CARE: Pt verbalized understanding of connection between chest pain and cocaine use. Pt appreciative of CSW suport, but declined SA tx resources stating, " I will stop on my own time." Pt reports positive experience at [facility] and plans to return at d/c.        Wandra Feinstein, MSW, [place] (weekend)

## 2012-02-14 NOTE — H&P (Signed)
IM Attending  22 woman with severe cocaine addiction, nearly daily crack smoking for decades.  Has chronic levamisole hypersensitivity and recurrent chest pain.  In now for chest pain.  Troponins negative.  EKG:  bi-atrial enlargement, LVH, T inversion V 2-5.  Twave abnormality has been noted on prior admissions; somewhat more striking this time. Chest pain better overnight but still present. Imp: Cocaine-induced coronary ischemia.  Does not seem to have actual MI.  Doubt any benefit from cardiac investigation.  Only important change would be abstinence and she is not motivated at all to pursue this.  She is depressed.  There might be some benefit to increase or adjustment in her regimen for this.

## 2012-02-14 NOTE — Progress Notes (Addendum)
Subjective:  Patient feels better. No chest pain. Afebrile. Had good sleep.  Objective: Vital signs in last 24 hours: Filed Vitals:   02/13/12 1321 02/13/12 1425 02/13/12 2100 02/14/12 0500  BP: 143/78 145/77 154/75 142/70  Pulse: 57 56 54 49  Temp:  98.3 F (36.8 C) 97.9 F (36.6 C) 97.5 F (36.4 C)  TempSrc:  Oral Oral Oral  Resp: 20 18 18 18   Height:  5' 1"  (1.549 m)    Weight:  119 lb (53.978 kg)  98 lb 1.7 oz (44.5 kg)  SpO2: 100% 100% 100% 100%   Weight change:   Intake/Output Summary (Last 24 hours) at 02/14/12 1502 Last data filed at 02/14/12 0900  Gross per 24 hour  Intake    360 ml  Output      0 ml  Net    360 ml   General: resting in bed, not in acute distress HEENT: PERRL, EOMI, no scleral icterus. Swelling, red and and tender over left ear pinna which is better than yesterday. Cardiac: S1/S2, RRR, No murmurs, gallops or rubs Pulm: Good air movement bilaterally, Clear to auscultation bilaterally, No rales, wheezing, rhonchi or rubs. Abd: Soft,  nondistended, nontender, no rebound pain, no organomegaly, BS present Ext: No edema, 2+DP/PT pulse bilaterally. Swelling and tender over left 4th PIP joint without redness. Skin: one 1.5 cm painful nodule on the forehead. A small painful nodule on the left and right wrists resolved today.  Neuro: alert and oriented X3, cranial nerves II-XII grossly intact, muscle strength 5/5 in all extremeties,  sensation to light touch intact.   Lab Results: Basic Metabolic Panel:  Lab 40/98/11 1614 02/13/12 0252  NA 140 143  K 3.4* 3.7  CL 106 107  CO2 25 --  GLUCOSE 106* 99  BUN 16 15  CREATININE 0.94 0.90  CALCIUM 9.2 --  MG -- --  PHOS -- --   Liver Function Tests:  Lab 02/13/12 1614  AST 24  ALT 15  ALKPHOS 74  BILITOT 0.2*  PROT 6.4  ALBUMIN 3.2*    Lab 02/13/12 1614  LIPASE 28  AMYLASE --   No results found for this basename: AMMONIA:2 in the last 168 hours CBC:  Lab 02/13/12 1614 02/13/12 0252  02/13/12 0230  WBC 3.0* -- 3.7*  NEUTROABS 1.5* -- --  HGB 11.6* 11.6* --  HCT 34.2* 34.0* --  MCV 84.9 -- 84.2  PLT 219 -- 233   Cardiac Enzymes:  Lab 02/14/12 0936 02/14/12 0201 02/13/12 1613  CKTOTAL 138 185* 222*  CKMB 4.1* 4.9* 5.2*  CKMBINDEX -- -- --  TROPONINI <0.30 <0.30 <0.30    Lab 02/13/12 1614  HGBA1C 5.8*   Fasting Lipid Panel:  Lab 02/14/12 0637  CHOL 200  HDL 64  LDLCALC 125*  TRIG 54  CHOLHDL 3.1  LDLDIRECT --   Thyroid Function Tests:  Lab 02/13/12 1614  TSH 0.667  T4TOTAL --  FREET4 --  T3FREE --  THYROIDAB --   Coagulation:  Lab 02/13/12 1614  LABPROT 14.2  INR 1.08   Anemia Panel: No results found for this basename: VITAMINB12,FOLATE,FERRITIN,TIBC,IRON,RETICCTPCT in the last 168 hours Urine Drug Screen: Drugs of Abuse     Component Value Date/Time   LABOPIA NONE DETECTED 02/13/2012 0901   LABOPIA POSITIVE* 06/12/2011 0221   COCAINSCRNUR POSITIVE* 02/13/2012 0901   COCAINSCRNUR NEGATIVE 06/12/2011 0221   LABBENZ NONE DETECTED 02/13/2012 0901   LABBENZ NEGATIVE 06/12/2011 0221   AMPHETMU NONE DETECTED 02/13/2012 0901  AMPHETMU NEGATIVE 06/12/2011 0221   THCU POSITIVE* 02/13/2012 0901   LABBARB NONE DETECTED 02/13/2012 0901     Micro Results: Recent Results (from the past 240 hour(s))  MRSA PCR SCREENING     Status: Normal   Collection Time   02/13/12  5:16 PM      Component Value Range Status Comment   MRSA by PCR NEGATIVE  NEGATIVE  Final    Studies/Results: Dg Chest 2 View  02/13/2012  *RADIOLOGY REPORT*  Clinical Data: Sharp constant stabbing pain with numbness in the left arm.  CHEST - 2 VIEW  Comparison: 12/03/2011  Findings: Normal heart size and pulmonary vascularity. Hyperinflation in the lungs.  Slight fibrosis in the upper lungs. Nodular opacities over the mid lungs likely to represent prominent nipple shadows.  These are stable.  No focal airspace consolidation.  No blunting of costophrenic angles.  No pneumothorax.   Calcification of the aorta.  IMPRESSION: No evidence of active pulmonary disease.  Original Report Authenticated By: Neale Burly, M.D.   Medications:  Scheduled Meds:   . amLODipine  2.5 mg Oral Daily  . aspirin  324 mg Oral NOW   Or  . aspirin  300 mg Rectal NOW  . aspirin EC  81 mg Oral Daily  . enoxaparin  40 mg Subcutaneous Q24H  . FLUoxetine  40 mg Oral Daily  . isosorbide mononitrate  30 mg Oral Daily  . mulitivitamin with minerals  1 tablet Oral Daily  . potassium chloride SA      . potassium chloride  20 mEq Oral Q4H  . predniSONE  10 mg Oral Q breakfast  . sodium chloride  3 mL Intravenous Q12H  . vitamin B-12  1,000 mcg Oral Daily  . zolpidem  10 mg Oral QHS  . DISCONTD: lisinopril  20 mg Oral Daily  . DISCONTD: multivitamin  1 capsule Oral Daily  . DISCONTD: naproxen  500 mg Oral BID WC  . DISCONTD: predniSONE  10 mg Oral Q breakfast   Continuous Infusions:  PRN Meds:.sodium chloride, diphenhydrAMINE, nitroGLYCERIN, ondansetron (ZOFRAN) IV, sodium chloride, traMADol Assessment/Plan: . Chest pain: most likely due to cocaine-induced coronary ischemia. Her symptoms started after cocaine use. Her EKG showed T wave inversion over V2 to V4 which is worse than her previous EKG (12/03/11). Troponin negative X 1. Patient also has some risk factors for coronary artery disease, including smoking, cocaine use, HTN.  Differential diagnosis for her chest pain include, but less likely PE (Revised Geneva score is 0), pneumothorax (normal CXR),  esophageal spasm ( no dysphagia or other), pneumonia (norma CXR), musculoskeletal chest pain, GERD, and anxiety.  another possibility is pericarditis which can not be ruled out competently now. Patient had history of pericarditis. Her chest pain is somewhat pleuritic in nature.    Troponin negative X 2.  Chest pain resolved.  TSH 0.667. A1c 5.8, LDL 125. Her cardiovascular risk score is 1 (her slightly elevated Bp may due to pain). Will not  start statin at this time.  Plan:   - appreciate cardiology's consult in managing our patient. Will follow up Recommendation. Possible Nuclear stress test tomorrow. - Continue Nitrostat and amlodipine and ASA - Imdur per cardiology - Avoid beta blocker. - No other NSIADs per cardiology    # Vasculitis associated with crack cocaine use. She had severe pain in her ears, most likely due to vasculitis. Ttreat with oral prednison  # Polysubstance abuse: admmited cocaine use on admission. Patient reports that her friends  around her all use cocaine which makes it difficult for her to stay away from drugs. She was encouraged to avoid all exposure to drugs.   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   Thought be as secondary to levamisole-induced toxicity according to previous discharge summary. Treat with prednisone.  # left arm numbness and pain: unclear etiology. There is no swelling or redness over her left arm. 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 Currently no weakness. Patient reports her numbness is better today.  -Will observe closely -consider gabapatin if get worse.  # Mild neutropenia: WBC 3.0 (3.7 yesterday). Unclear etiology. May related to cocaine abuse. No signs of infection. Afebrile. Will monitor CBC. If spike fever, will get blood culture and start abx.   #  Subcutaneous  Nodules : unclear etiology. Patient has similar presentation in 04/2010. She had  multiple painful skin nodules in her extremities on that amission. Culture did not show any bacteria. It was thought to be levamisole induced vasculitis. Differential diagonsis includes cocaine-induced necrotizing granulomatous vasculitis (destruction of the vascular system by granuloma).  -continue with prednisone   -Monitor for any signs of infection.      DVT PPX - Lovenox        LOS: 1 day   [name] 02/14/2012, 3:02 PM

## 2012-02-14 NOTE — Plan of Care (Signed)
Problem: Phase II Progression Outcomes Goal: Stress Test if indicated Outcome: Progressing myoview in am

## 2012-02-15 ENCOUNTER — Inpatient Hospital Stay (HOSPITAL_COMMUNITY): Payer: Medicaid Other

## 2012-02-15 LAB — GLUCOSE, CAPILLARY: Glucose-Capillary: 105 mg/dL — ABNORMAL HIGH (ref 70–99)

## 2012-02-15 LAB — DIFFERENTIAL
Eosinophils Absolute: 0.1 10*3/uL (ref 0.0–0.7)
Lymphs Abs: 2.7 10*3/uL (ref 0.7–4.0)
Monocytes Relative: 2 % — ABNORMAL LOW (ref 3–12)
Neutrophils Relative %: 49 % (ref 43–77)

## 2012-02-15 LAB — IRON AND TIBC: Saturation Ratios: 24 % (ref 20–55)

## 2012-02-15 LAB — CBC
Hemoglobin: 12.5 g/dL (ref 12.0–15.0)
MCH: 29.1 pg (ref 26.0–34.0)
RBC: 4.3 MIL/uL (ref 3.87–5.11)

## 2012-02-15 LAB — BASIC METABOLIC PANEL
CO2: 26 mEq/L (ref 19–32)
Chloride: 104 mEq/L (ref 96–112)
GFR calc non Af Amer: >90 mL/min (ref >90)
Glucose, Bld: 85 mg/dL (ref 70–99)
Potassium: 4.1 mEq/L (ref 3.5–5.1)
Sodium: 138 mEq/L (ref 135–145)

## 2012-02-15 LAB — FOLATE: Folate: 17 ng/mL

## 2012-02-15 LAB — VITAMIN B12: Vitamin B-12: 1139 pg/mL — ABNORMAL HIGH (ref 211–911)

## 2012-02-15 LAB — RETICULOCYTES: Retic Ct Pct: 1.6 % (ref 0.4–3.1)

## 2012-02-15 MED ORDER — AMLODIPINE BESYLATE 2.5 MG PO TABS: 2.5000 mg | ORAL_TABLET | Freq: Every day | ORAL | Status: DC

## 2012-02-15 MED ORDER — REGADENOSON 0.4 MG/5ML IV SOLN
0.4000 mg | Freq: Once | INTRAVENOUS | Status: AC
  Administered 2012-02-15: 0.4 mg via INTRAVENOUS
  Filled 2012-02-15: qty 5

## 2012-02-15 MED ORDER — TRAMADOL HCL 50 MG PO TABS: 50.0000 mg | ORAL_TABLET | Freq: Four times a day (QID) | ORAL | Status: DC | PRN

## 2012-02-15 MED ORDER — ZOLPIDEM TARTRATE 10 MG PO TABS: 10.0000 mg | ORAL_TABLET | Freq: Every day | ORAL | Status: DC

## 2012-02-15 MED ORDER — ASPIRIN 81 MG PO TBEC: 81.0000 mg | DELAYED_RELEASE_TABLET | Freq: Every day | ORAL | Status: AC

## 2012-02-15 MED ORDER — PREDNISONE 10 MG PO TABS: 5.0000 mg | ORAL_TABLET | Freq: Every day | ORAL | Status: DC

## 2012-02-15 NOTE — Progress Notes (Signed)
Subjective:  Patient denies any chest pain or shortness of breath Patient had a nuclear stress test today which was negative for ischemia or infarction with normal EF Objective:  Vital Signs in the last 24 hours: Temp:  [97.8 F (36.6 C)-98.4 F (36.9 C)] 98.4 F (36.9 C) (05/13 1330) Pulse Rate:  [51-75] 51  (05/13 1330) Resp:  [16-18] 17  (05/13 1330) BP: (109-156)/(63-95) 143/82 mmHg (05/13 1330) SpO2:  [100 %] 100 % (05/13 1330)  Intake/Output from previous day: 05/12 0701 - 05/13 0700 In: 480 [P.O.:480] Out: 200 [Urine:200] Intake/Output from this shift: Total I/O In: 360 [P.O.:360] Out: -   Physical Exam: Neck: no adenopathy, no carotid bruit, no JVD and supple, symmetrical, trachea midline Lungs: clear to auscultation bilaterally Heart: regular rate and rhythm, S1, S2 normal, no murmur, click, rub or gallop Abdomen: soft, non-tender; bowel sounds normal; no masses,  no organomegaly Extremities: extremities normal, atraumatic, no cyanosis or edema  Lab Results:  Basename 02/15/12 0532 02/13/12 1614  WBC 5.7 3.0*  HGB 12.5 11.6*  PLT 255 219    Basename 02/15/12 0532 02/13/12 1614  NA 138 140  K 4.1 3.4*  CL 104 106  CO2 26 25  GLUCOSE 85 106*  BUN 15 16  CREATININE 0.69 0.94    Basename 02/14/12 0936 02/14/12 0201  TROPONINI <0.30 <0.30   Hepatic Function Panel  Basename 02/13/12 1614  PROT 6.4  ALBUMIN 3.2*  AST 24  ALT 15  ALKPHOS 74  BILITOT 0.2*  BILIDIR --  IBILI --    Basename 02/14/12 0637  CHOL 200   No results found for this basename: PROTIME in the last 72 hours  Imaging: Imaging results have been reviewed and Nm Myocar Multi W/spect W/wall Motion / Ef  02/15/2012  *RADIOLOGY REPORT*  Clinical Data:  Chest pain.  MYOCARDIAL IMAGING WITH SPECT (REST AND PHARMACOLOGIC-STRESS) GATED LEFT VENTRICULAR WALL MOTION STUDY LEFT VENTRICULAR EJECTION FRACTION  Technique:  Standard myocardial SPECT imaging was performed after resting  intravenous injection of 10 mCi Tc-86mtetrofosmin. Subsequently, intravenous infusion of Lexiscan was performed under the supervision of the Cardiology staff.  At peak effect of the drug, 30 mCi Tc-965metrofosmin was injected intravenously and standard myocardial SPECT  imaging was performed.  Quantitative gated imaging was also performed to evaluate left ventricular wall motion, and estimate left ventricular ejection fraction.  Comparison:  None  Findings: SPECT imaging demonstrates no reversible or irreversible defects to suggest ischemia or infarction.  Quantitative gated analysis shows normal wall motion.  The resting left ventricular ejection fraction is 54% with end- diastolic volume of 75 ml and end-systolic volume of 34 ml.  IMPRESSION: No evidence of ischemia or infarction.  Ejection fraction 54%.  Original Report Authenticated By: KERaelyn NumberM.D.    Cardiac Studies:  Assessment/Plan:  Atypical chest pain/left arm pain with EKG changes probably secondary to cocaine-induced vasospasm MI ruled out  Hypertension  Polysubstance abuse  History of vasculitis  History of rheumatoid arthritis  Anemia Plan Continue present management Okay to discharge from cardiac point of view   LOS: 2 days    Prisca Gearing N 02/15/2012, 5:05 PM

## 2012-02-15 NOTE — Progress Notes (Signed)
Subjective:  Patient feels better. No chest pain. Afebrile. Had good sleep. Her left arm numbness improved. Her subcutaneous nodules on wrists resolved and the one on forehead decreased in size and is not painful anymore. Her ear pain resolved.   Objective: Vital signs in last 24 hours: Filed Vitals:   02/14/12 0500 02/14/12 1400 02/14/12 2100 02/15/12 0500  BP: 142/70 110/70 130/63 120/81  Pulse: 49 62 52 55  Temp: 97.5 F (36.4 C) 98.4 F (36.9 C) 98.3 F (36.8 C) 97.8 F (36.6 C)  TempSrc: Oral Oral Oral   Resp: 18 18 18 16   Height:      Weight: 98 lb 1.7 oz (44.5 kg)     SpO2: 100% 98% 100% 100%   Weight change:   Intake/Output Summary (Last 24 hours) at 02/15/12 0734 Last data filed at 02/15/12 0032  Gross per 24 hour  Intake    480 ml  Output    200 ml  Net    280 ml   General: resting in bed, not in acute distress HEENT: PERRL, EOMI, no scleral icterus.  Cardiac: S1/S2, RRR, No murmurs, gallops or rubs Pulm: Good air movement bilaterally, Clear to auscultation bilaterally, No rales, wheezing, rhonchi or rubs. Abd: Soft,  nondistended, nontender, no rebound pain, no organomegaly, BS present Ext: No edema, 2+DP/PT pulse bilaterally. Swelling and tender over left 4th PIP joint without redness. Skin: one 0.5 cm nodule on the forehead.  Neuro: alert and oriented X3, cranial nerves II-XII grossly intact, muscle strength 5/5 in all extremeties,  sensation to light touch intact.   Lab Results: Basic Metabolic Panel:  Lab 35/67/01 0532 02/13/12 1614  NA 138 140  K 4.1 3.4*  CL 104 106  CO2 26 25  GLUCOSE 85 106*  BUN 15 16  CREATININE 0.69 0.94  CALCIUM 9.3 9.2  MG -- --  PHOS -- --   Liver Function Tests:  Lab 02/13/12 1614  AST 24  ALT 15  ALKPHOS 74  BILITOT 0.2*  PROT 6.4  ALBUMIN 3.2*    Lab 02/13/12 1614  LIPASE 28  AMYLASE --   No results found for this basename: AMMONIA:2 in the last 168 hours CBC:  Lab 02/15/12 0532 02/13/12 1614  WBC  5.7 3.0*  NEUTROABS 2.8 1.5*  HGB 12.5 11.6*  HCT 36.7 34.2*  MCV 85.3 84.9  PLT 255 219   Cardiac Enzymes:  Lab 02/14/12 0936 02/14/12 0201 02/13/12 1613  CKTOTAL 138 185* 222*  CKMB 4.1* 4.9* 5.2*  CKMBINDEX -- -- --  TROPONINI <0.30 <0.30 <0.30    Lab 02/13/12 1614  HGBA1C 5.8*   Fasting Lipid Panel:  Lab 02/14/12 0637  CHOL 200  HDL 64  LDLCALC 125*  TRIG 54  CHOLHDL 3.1  LDLDIRECT --   Thyroid Function Tests:  Lab 02/13/12 1614  TSH 0.667  T4TOTAL --  FREET4 --  T3FREE --  THYROIDAB --   Coagulation:  Lab 02/13/12 1614  LABPROT 14.2  INR 1.08   Anemia Panel:  Lab 02/15/12 0532  VITAMINB12 --  FOLATE --  FERRITIN --  TIBC --  IRON --  RETICCTPCT 1.6   Urine Drug Screen: Drugs of Abuse     Component Value Date/Time   LABOPIA NONE DETECTED 02/13/2012 0901   LABOPIA POSITIVE* 06/12/2011 0221   COCAINSCRNUR POSITIVE* 02/13/2012 0901   COCAINSCRNUR NEGATIVE 06/12/2011 0221   LABBENZ NONE DETECTED 02/13/2012 0901   LABBENZ NEGATIVE 06/12/2011 0221   AMPHETMU NONE DETECTED 02/13/2012 0901  AMPHETMU NEGATIVE 06/12/2011 0221   THCU POSITIVE* 02/13/2012 0901   LABBARB NONE DETECTED 02/13/2012 0901     Micro Results: Recent Results (from the past 240 hour(s))  MRSA PCR SCREENING     Status: Normal   Collection Time   02/13/12  5:16 PM      Component Value Range Status Comment   MRSA by PCR NEGATIVE  NEGATIVE  Final    Studies/Results: No results found. Medications:  Scheduled Meds:    . amLODipine  2.5 mg Oral Daily  . aspirin EC  81 mg Oral Daily  . enoxaparin  40 mg Subcutaneous Q24H  . FLUoxetine  40 mg Oral Daily  . isosorbide mononitrate  30 mg Oral Daily  . mulitivitamin with minerals  1 tablet Oral Daily  . potassium chloride SA      . potassium chloride  20 mEq Oral Q4H  . predniSONE  10 mg Oral Q breakfast  . sodium chloride  3 mL Intravenous Q12H  . vitamin B-12  1,000 mcg Oral Daily  . zolpidem  10 mg Oral QHS  . DISCONTD:  lisinopril  20 mg Oral Daily  . DISCONTD: naproxen  500 mg Oral BID WC   Continuous Infusions:  PRN Meds:.sodium chloride, diphenhydrAMINE, nitroGLYCERIN, ondansetron (ZOFRAN) IV, sodium chloride, traMADol Assessment/Plan: . Chest pain: most likely due to cocaine-induced coronary ischemia. Her symptoms started after cocaine use. Her EKG showed T wave inversion over V2 to V4 which is worse than her previous EKG (12/03/11). Troponin negative X 1. Patient also has some risk factors for coronary artery disease, including smoking, cocaine use, HTN.  Differential diagnosis for her chest pain include, but less likely PE (Revised Geneva score is 0), pneumothorax (normal CXR),  esophageal spasm ( no dysphagia or other), pneumonia (norma CXR), musculoskeletal chest pain, GERD, and anxiety.  another possibility is pericarditis which can not be ruled out competently now. Patient had history of pericarditis. Her chest pain is somewhat pleuritic in nature.    Troponin negative X 3.  Chest pain resolved.  TSH 0.667. A1c 5.8, LDL 125. Her cardiovascular risk score is 2, will not start statin at this time.  Plan:   - appreciate cardiology's consult in managing our patient. Will follow up Recommendation. Nuclear stress test  today. - Continue Nitrostat and amlodipine and ASA - Imdur per cardiology - Avoid beta blocker. - No other NSIADs per cardiology - If stress test negative, will discharge.    # Vasculitis associated with crack cocaine use. She had severe pain in her pinna of left ear.  most likely due to vasculitis. Resolved.   # Polysubstance abuse: admmited cocaine use on admission. Patient reports that her friends around her all use cocaine which makes it difficult for her to stay away from drugs. She was encouraged to avoid all exposure to drugs.   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  improving. Thought be as secondary to levamisole-induced toxicity according to previous discharge summary. Treat with  prednisone.  # left arm numbness and pain: improving, unclear etiology. There is no swelling or redness over her left arm. 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 Currently no weakness.   -Will observe closely -consider gabapatin if get worse.  # Mild neutropenia: resolved. WBC 5.7 today (3.0 yesterday). Unclear etiology. May related to cocaine abuse. No signs of infection. Afebrile. Will monitor CBC. If spike fever, will get blood culture and start abx.   #  Subcutaneous  Nodules : improving. unclear etiology.  Patient has similar presentation in 04/2010. She had  multiple painful skin nodules in her extremities on that amission. Culture did not show any bacteria. It was thought to be levamisole induced vasculitis. Differential diagonsis includes cocaine-induced necrotizing granulomatous vasculitis (destruction of the vascular system by granuloma).  -continue with prednisone   -Monitor for any signs of infection.      DVT PPX - Lovenox        LOS: 2 days   Ivor Costa 02/15/2012, 7:34 AM

## 2012-02-15 NOTE — Progress Notes (Signed)
Clinical Social Work-CSW confirmed d/c with [facility]-CSW updated medications on FL2 and obtained MD signature. [place] will be providing transportation at [employer]. No further needs- Gerre Scull, [phone]

## 2012-02-15 NOTE — Progress Notes (Signed)
UR Completed Reiana Poteet Graves-Bigelow, RN,BSN [phone]  

## 2012-02-15 NOTE — Discharge Instructions (Signed)
1. Please follow-up in the internal medicine clinic. @ 2:45 PM on 03/01/12- please call the clinic if you are unable to make your appointment and need to reschedule [place] [phone], [phone] 2. Please take all medications as prescribed.  3. If you have worsening of your symptoms or new symptoms arise, please call the clinic ([phone]), or go to the ER immediately if symptoms are severe. 1.

## 2012-02-16 MED ORDER — TECHNETIUM TC 99M TETROFOSMIN IV KIT
10.0000 | PACK | Freq: Once | INTRAVENOUS | Status: AC | PRN
  Administered 2012-02-15: 10 via INTRAVENOUS

## 2012-02-16 MED ORDER — TECHNETIUM TC 99M TETROFOSMIN IV KIT
30.0000 | PACK | Freq: Once | INTRAVENOUS | Status: AC | PRN
  Administered 2012-02-15: 30 via INTRAVENOUS

## 2012-02-16 NOTE — ED Provider Notes (Signed)
Medical screening examination/treatment/procedure(s) were performed by non-physician practitioner and as supervising physician I was immediately available for consultation/collaboration.  Carlisle Beers, MD 02/16/12 [phone]

## 2012-03-01 ENCOUNTER — Encounter: Payer: Medicaid Other | Admitting: Internal Medicine

## 2012-03-05 IMAGING — US US ABDOMEN COMPLETE
1 series · 2 of 2 positions shown · non-contrast
Comparison: CT abdomen pelvis 03/22/2010 at 7773 hours.

CLINICAL DATA: Right upper quadrant pain.

COMPLETE ABDOMINAL ULTRASOUND

[Series 1: us abdomen complete · 0.28mm/px · 2 of 2 slices shown]
[im 1/2]
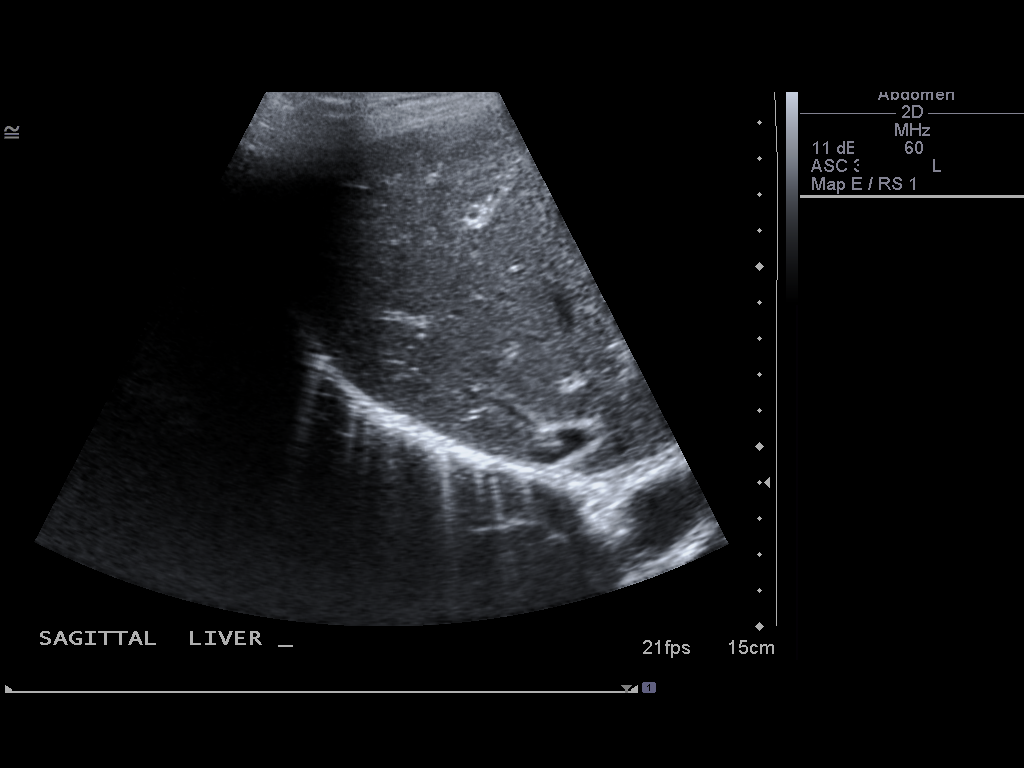
[im 2/2]
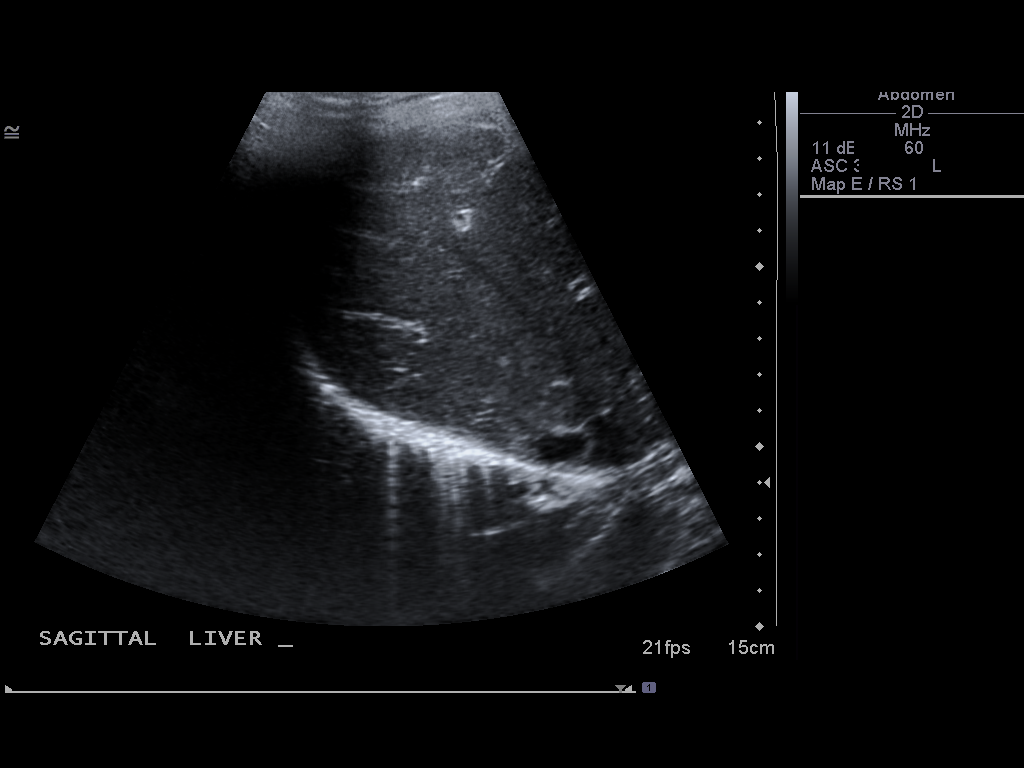

[2 of 2 positions shown; findings below may reference images not displayed]

FINDINGS: Gallbladder:  There are no gallstones, pericholecystic fluid or
gallbladder wall thickening.  Sonographer reports negative [name]'s
sign.

Common bile duct:  Measures 4.0 mm, normal.

Liver:  No focal lesion identified.  Within normal limits in
parenchymal echogenicity.

IVC:  Appears normal.

Pancreas:  No focal abnormality seen.

Spleen:  Measures 10.4 cm and appears normal.

Right Kidney:  Measures 11.1 cm and appears normal.

Left Kidney:  Measures 11.1 cm and appears normal.

Abdominal aorta:  No aneurysm identified.
IMPRESSION: Negative abdominal ultrasound.

## 2012-03-05 IMAGING — CT CT ABD-PELV W/O CM
2 of 4 series · 17 of 46 positions shown, 19 images · non-contrast
Comparison: 05/31/2009

CLINICAL DATA: Right flank pain

CT ABDOMEN AND PELVIS WITHOUT CONTRAST
TECHNIQUE: Multidetector CT imaging of the abdomen and pelvis was
performed following the standard protocol without intravenous
contrast.

[Series 2: stone_wo 5.0 b40f st · axial · 0.57mm/px · z∈[-414,-54]mm · 14 of 79 slices shown, 16 images]
[im 4/79  soft-tissue]
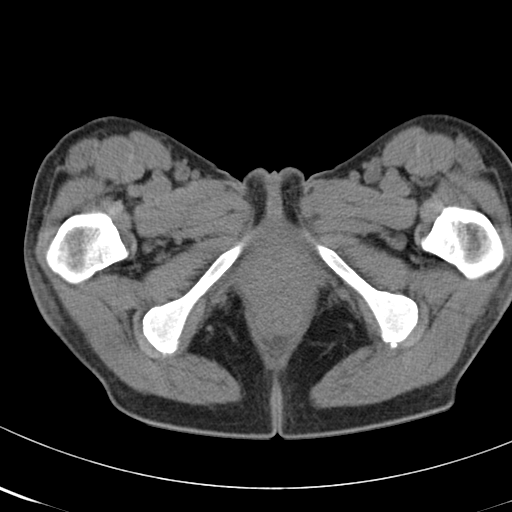
[im 4/79  bone]
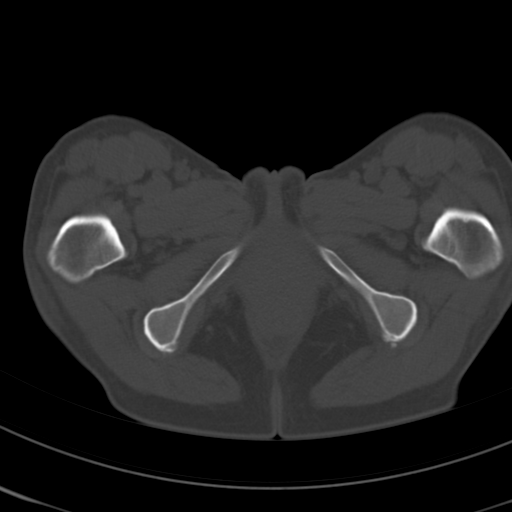
[im 10/79  soft-tissue]
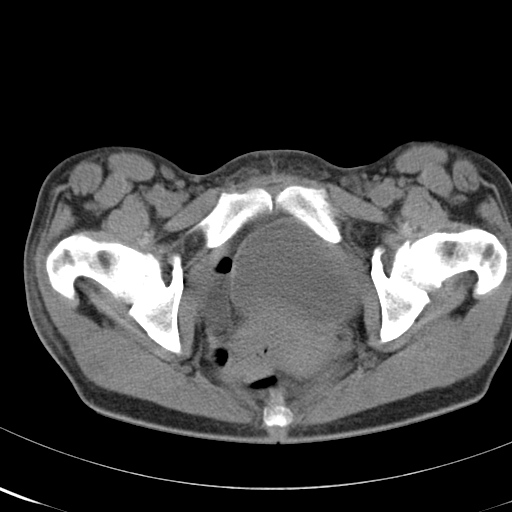
[im 16/79  soft-tissue]
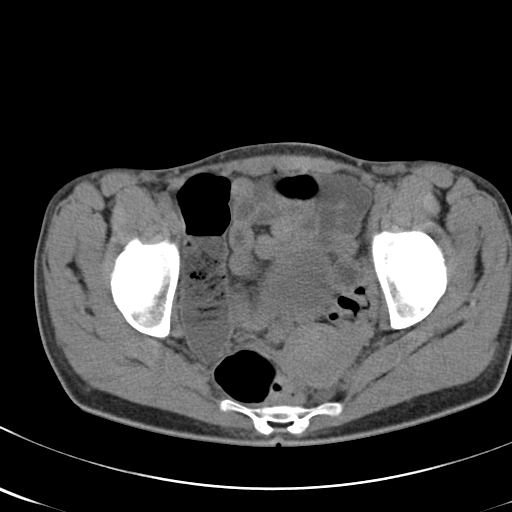
[im 22/79  soft-tissue]
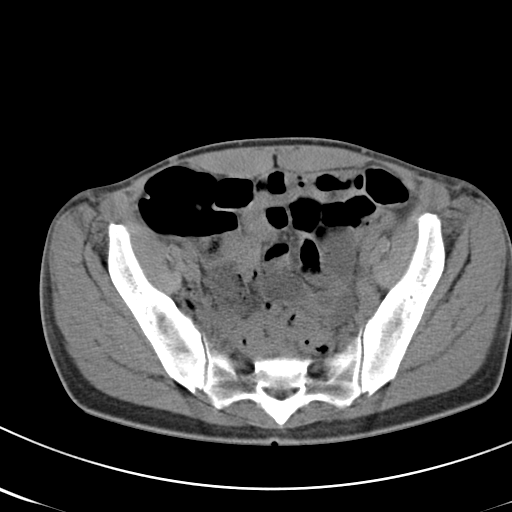
[im 28/79  soft-tissue]
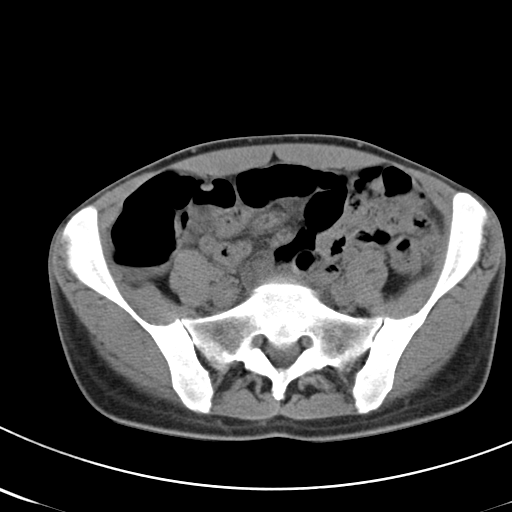
[im 31/79  soft-tissue]
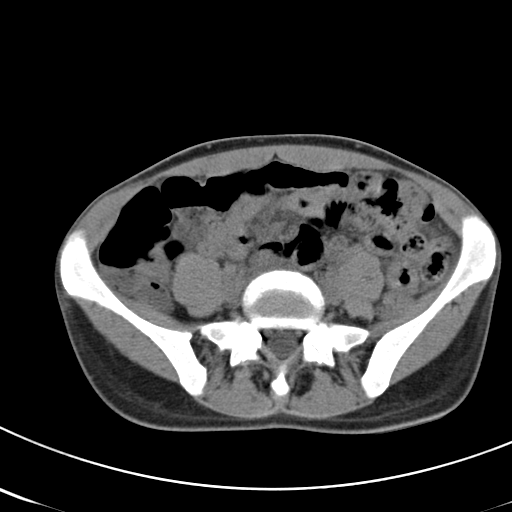
[im 37/79  soft-tissue]
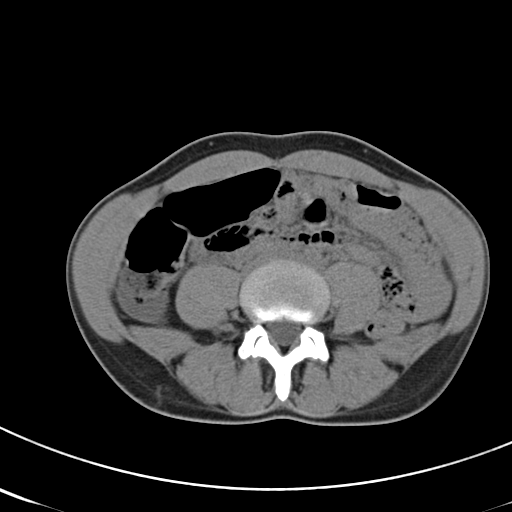
[im 43/79  soft-tissue]
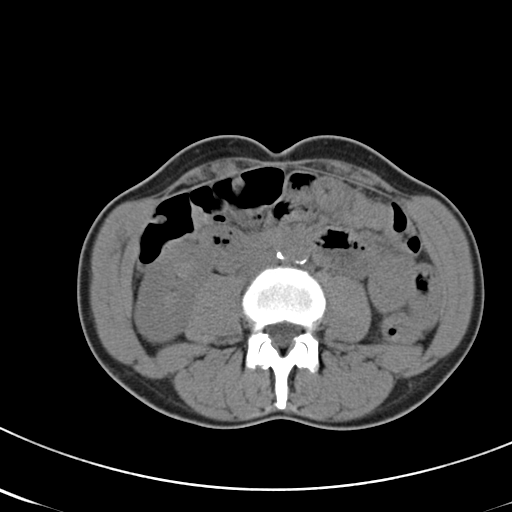
[im 49/79  soft-tissue]
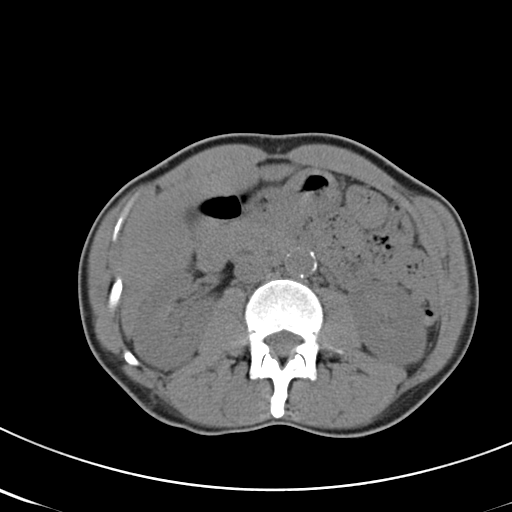
[im 49/79  bone]
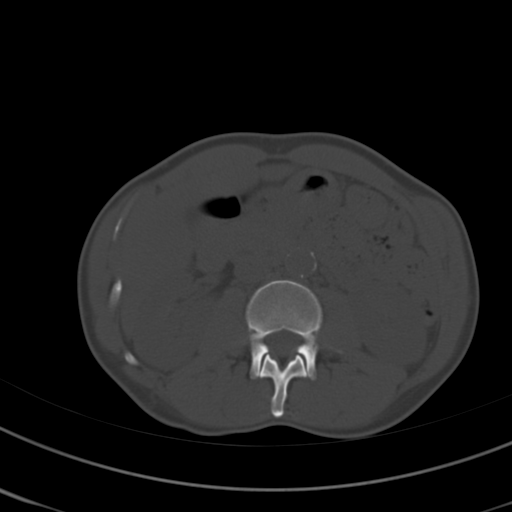
[im 52/79  soft-tissue]
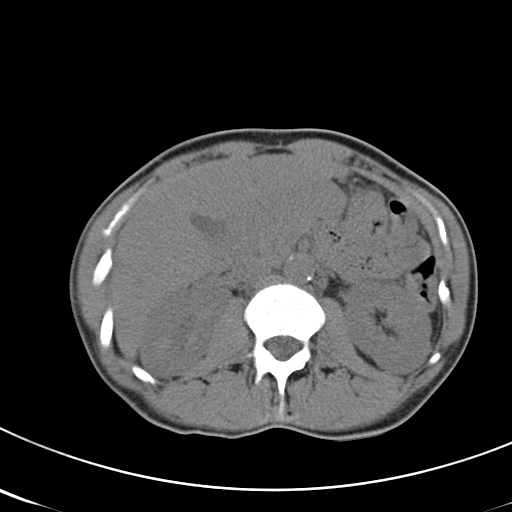
[im 58/79  soft-tissue]
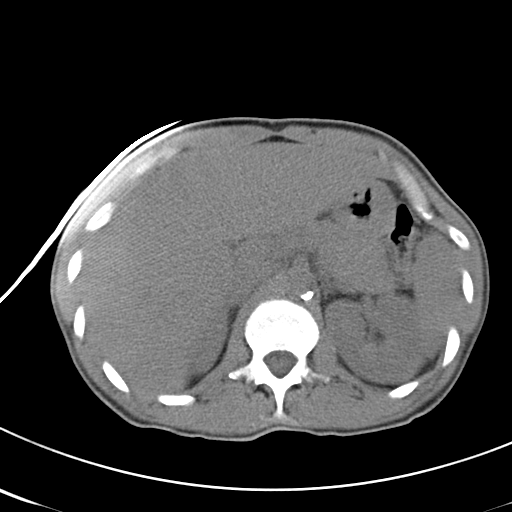
[im 64/79  soft-tissue]
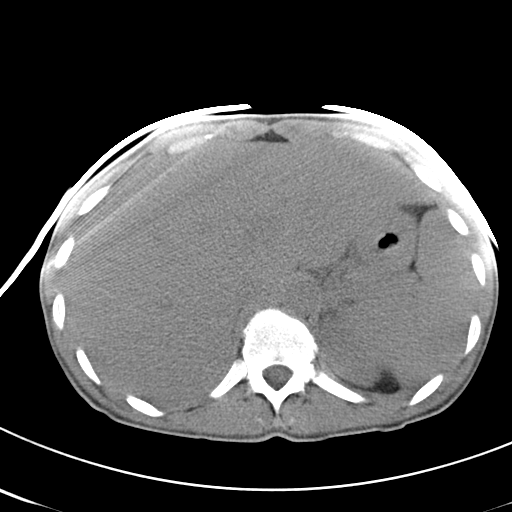
[im 70/79  soft-tissue]
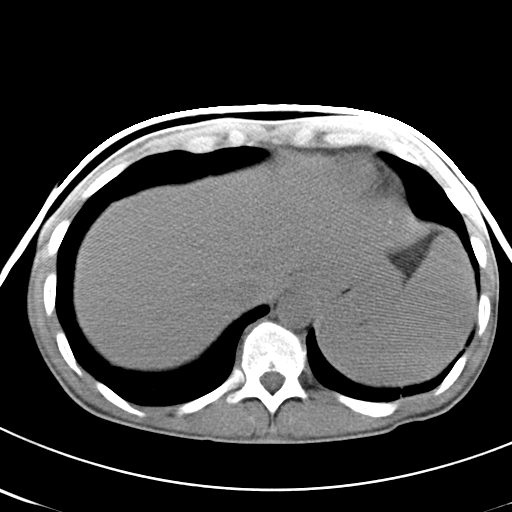
[im 76/79  soft-tissue]
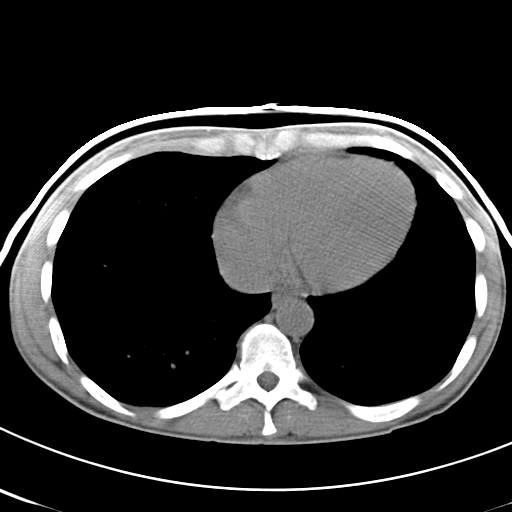

[Series 602: coronal abdomen · coronal · 0.81mm/px · 3 of 89 slices shown]
[im 30/89  soft-tissue]
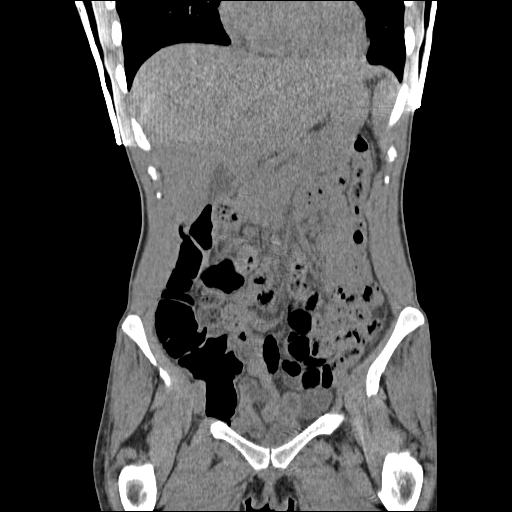
[im 40/89  soft-tissue]
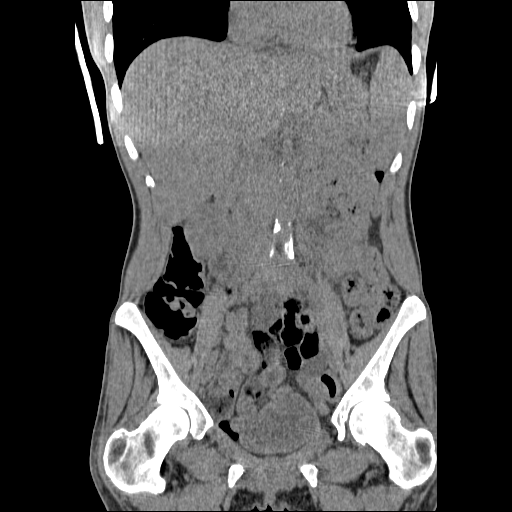
[im 49/89  soft-tissue]
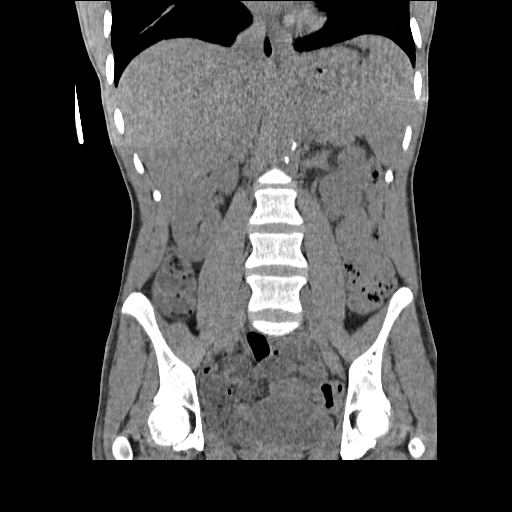

[17 of 46 positions shown; findings below may reference images not displayed]

FINDINGS: No hydronephrosis.  No definite urinary calculus.

Gas and stool throughout small and large bowel is present.

The unenhanced liver, gallbladder, spleen, pancreas, adrenal glands
are grossly within normal limits.  No free intraperitoneal gas.  No
definite free intraperitoneal fluid in the abdomen.  Trace free
fluid is seen layering in the left side the pelvis.  Unremarkable
bladder.  Appendix is obscured.
IMPRESSION: No evidence of urinary calculus.

## 2012-03-21 IMAGING — CR DG CHEST 2V
2 series · 2 of 2 positions shown · non-contrast
Comparison: 02/04/2010

CLINICAL DATA: Chest pain

CHEST - 2 VIEW

[w chest lat]
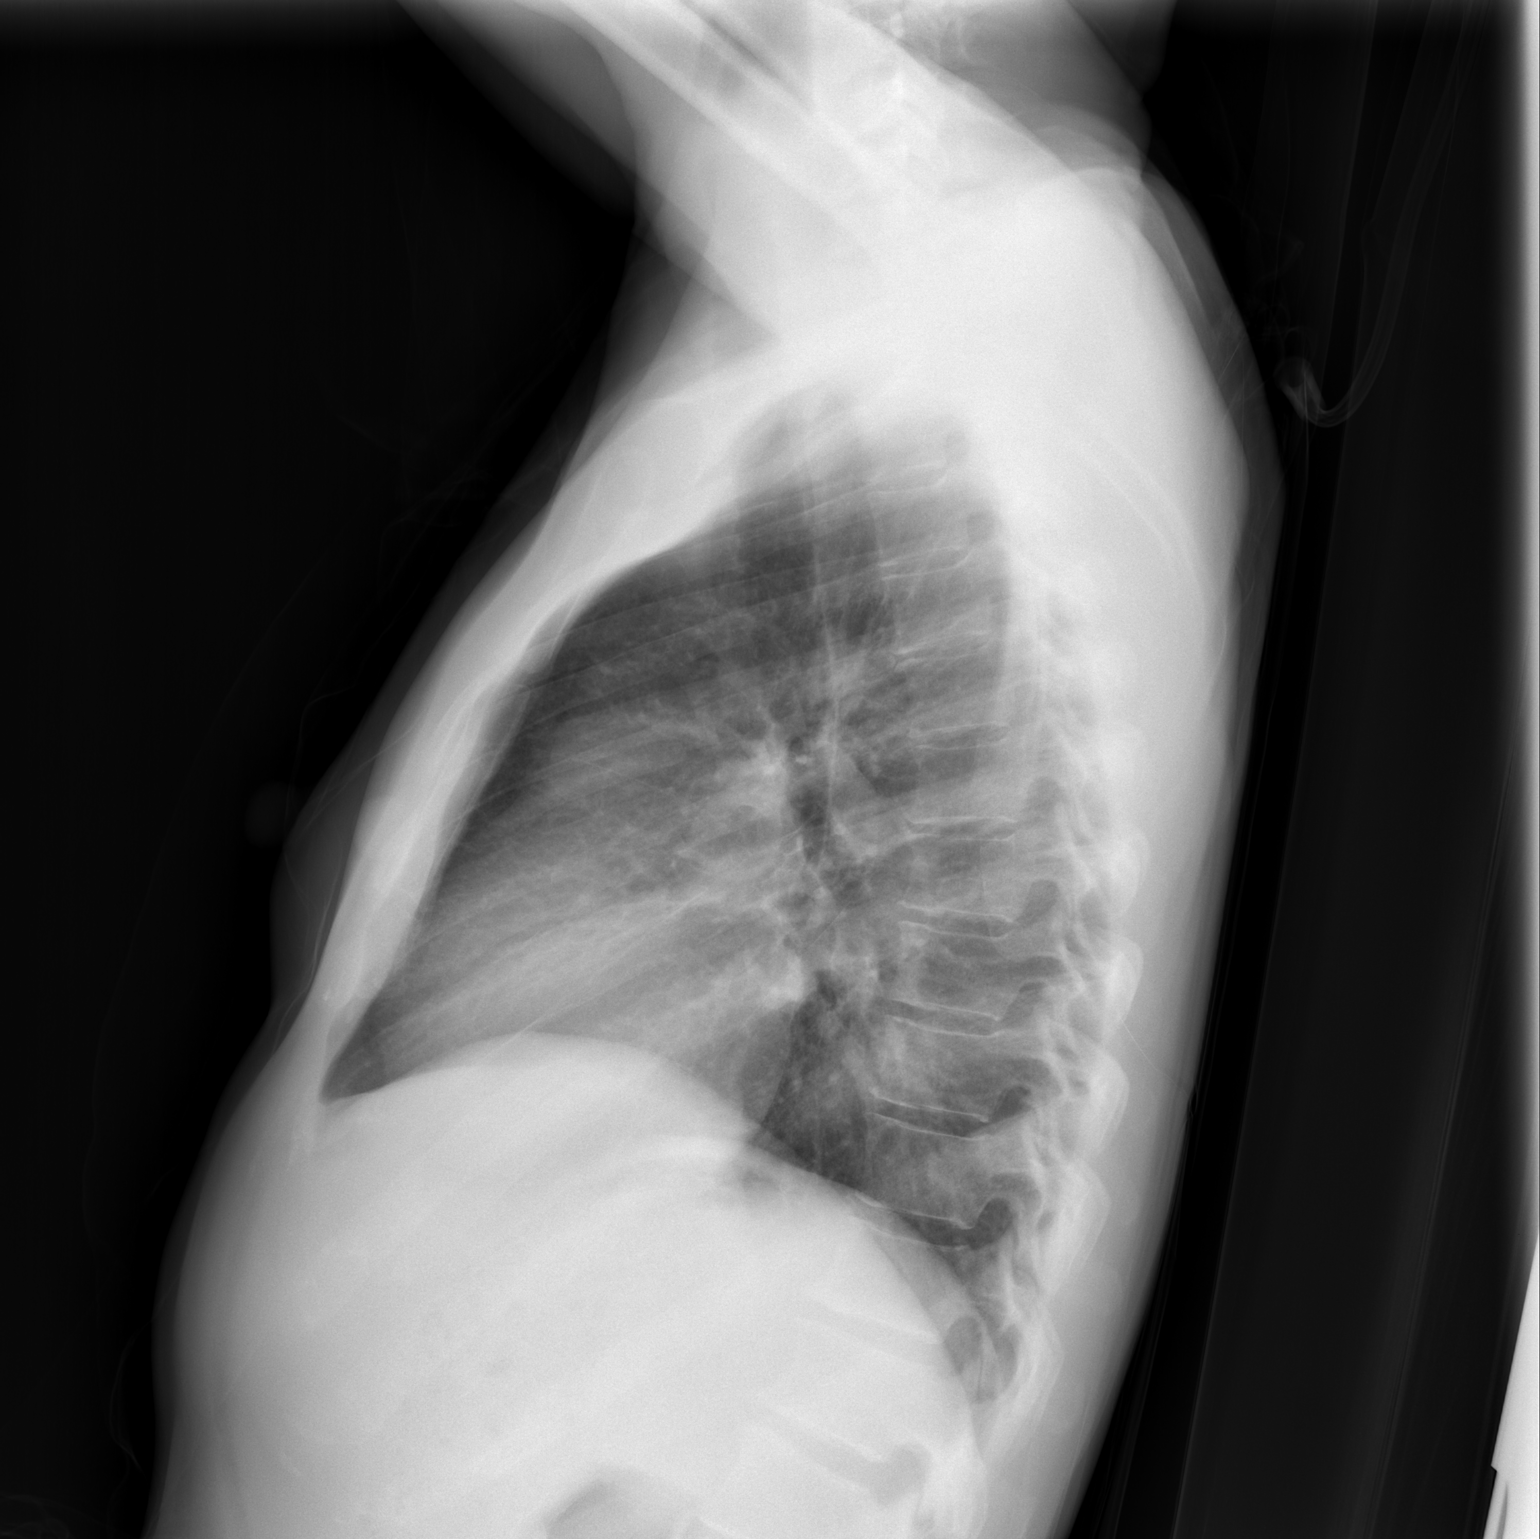

[view not recorded]
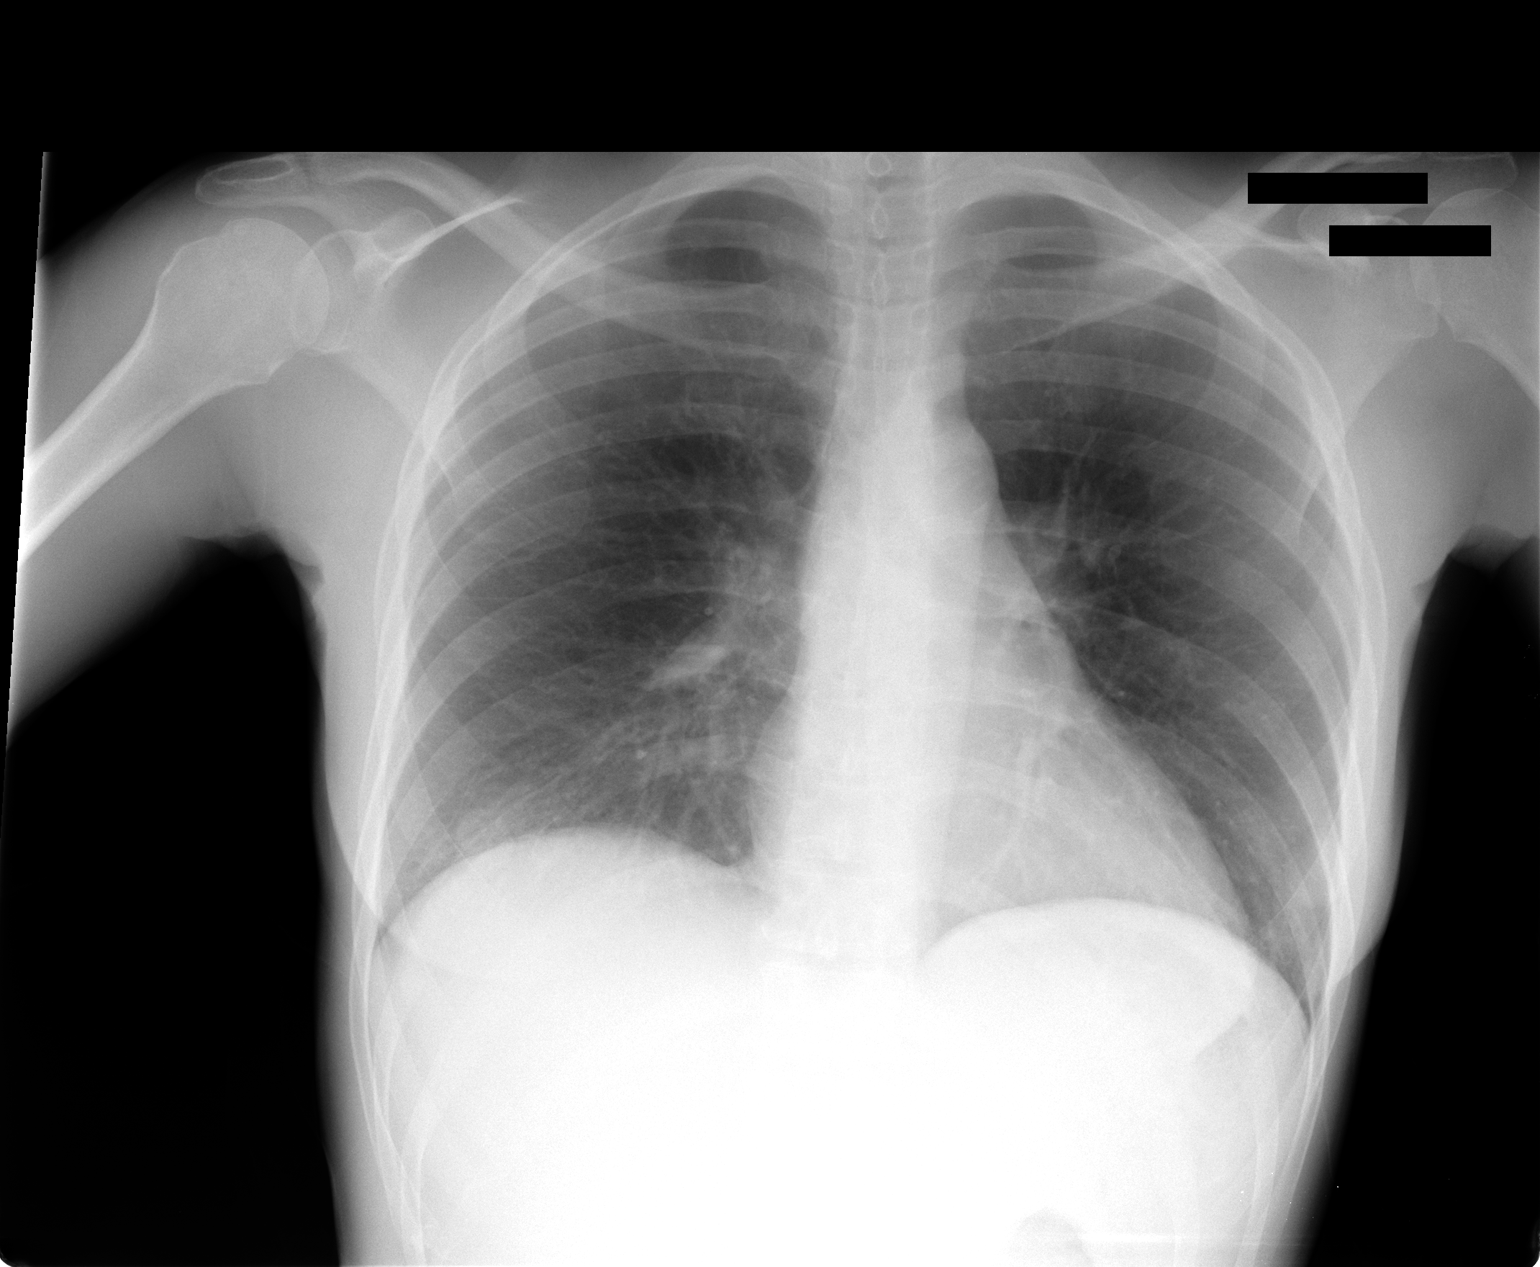

[2 of 2 positions shown; findings below may reference images not displayed]

FINDINGS: Nipple shadow incidentally noted over the right lung
base.  Heart size normal.  Lung fields are grossly clear allowing
for technique and hypoaeration.  No pleural effusion.  No acute
bony abnormality.
IMPRESSION: No acute abnormality.

## 2012-03-21 IMAGING — CT CT ANGIO CHEST
2 of 7 series · 19 of 36 positions shown · IV contrast (APPLIED)
Comparison: Chest x-ray dated 04/07/2010 and CTA of the chest
dated 01/30/2010

CLINICAL DATA: Chest pain and cough.  Elevated D-dimer.  Right
upper quadrant pain.

CT ANGIOGRAPHY CHEST WITH CONTRAST
TECHNIQUE: Multidetector CT imaging of the chest was performed
using the standard protocol during bolus administration of
intravenous contrast.  Multiplanar CT image reconstructions
including MIPs were obtained to evaluate the vascular anatomy.
Contrast:  80 ml [id number]

[Series 7: pe thins @ 1mm · axial · 0.59mm/px · z∈[-272,-49]mm · 18 of 251 slices shown]
[im 14/251  lung]
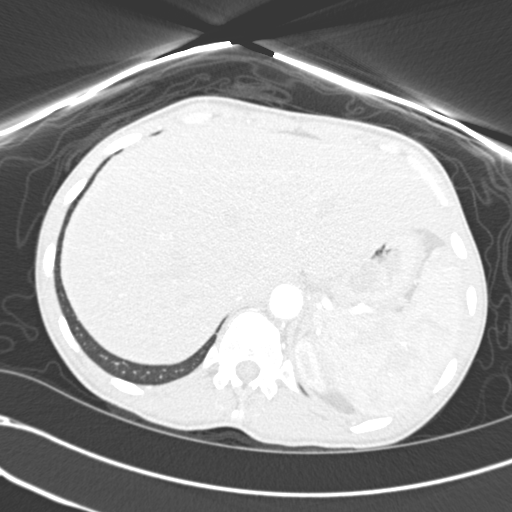
[im 27/251  mediastinal]
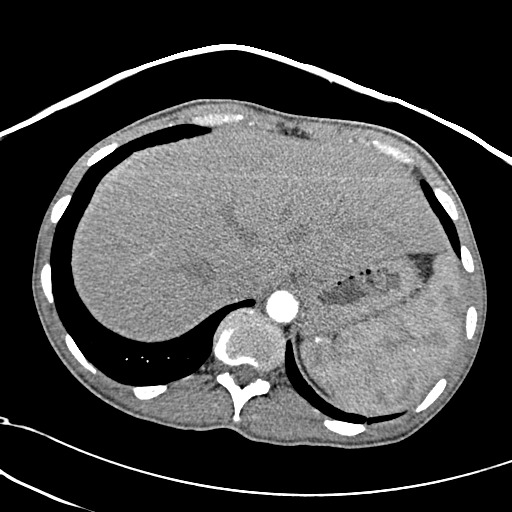
[im 40/251  lung]
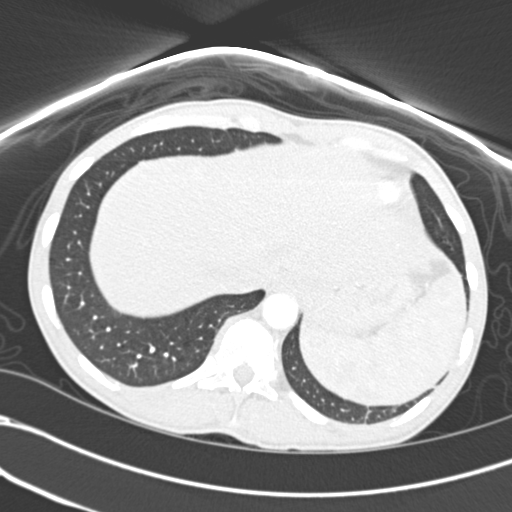
[im 53/251  mediastinal]
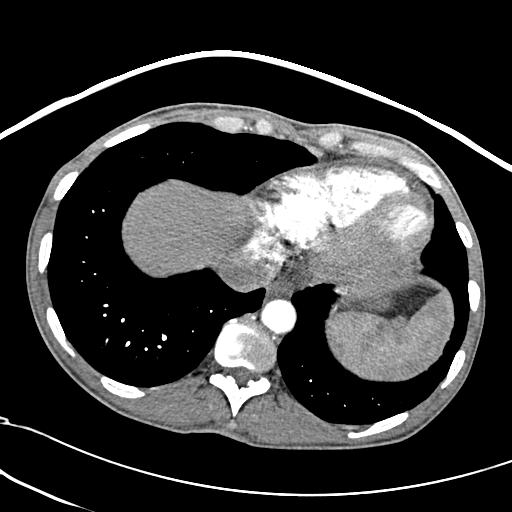
[im 66/251  lung]
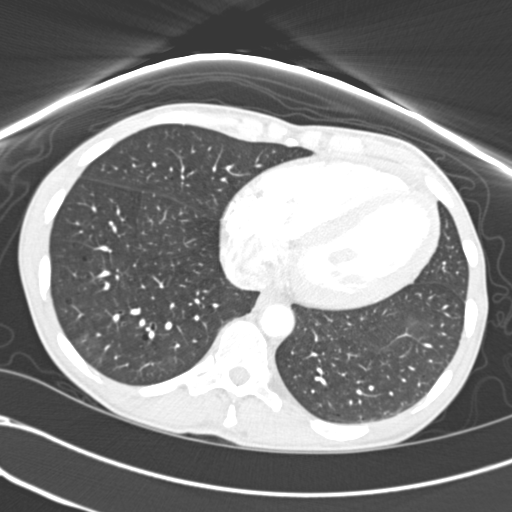
[im 79/251  mediastinal]
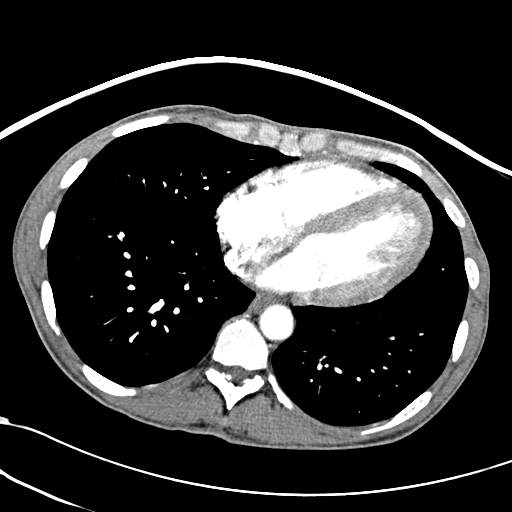
[im 93/251  lung]
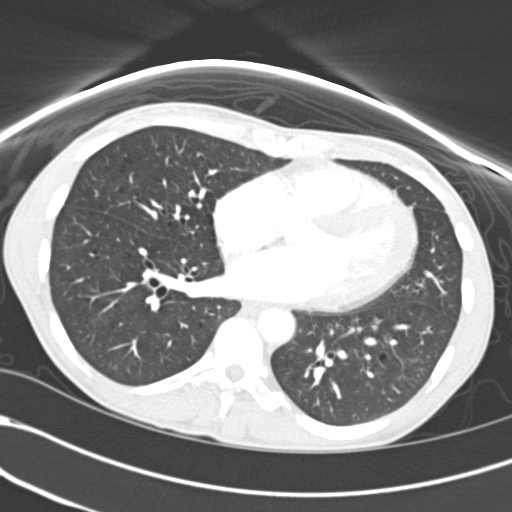
[im 106/251  mediastinal]
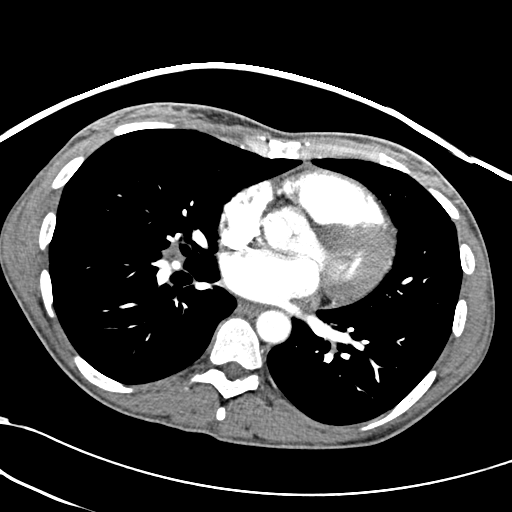
[im 119/251  lung]
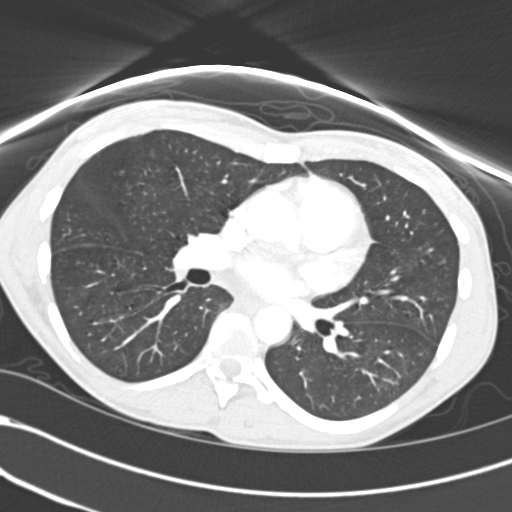
[im 132/251  mediastinal]
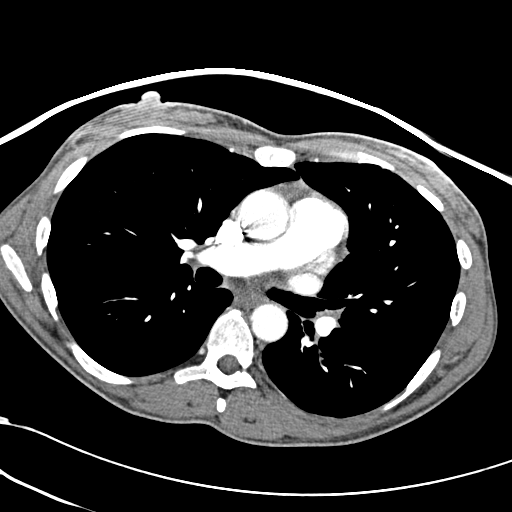
[im 145/251  lung]
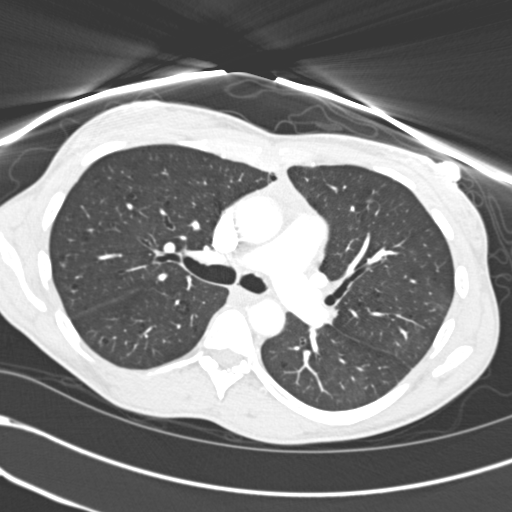
[im 158/251  mediastinal]
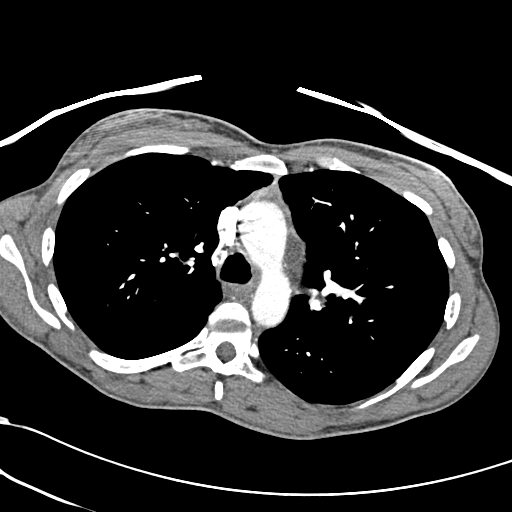
[im 172/251  lung]
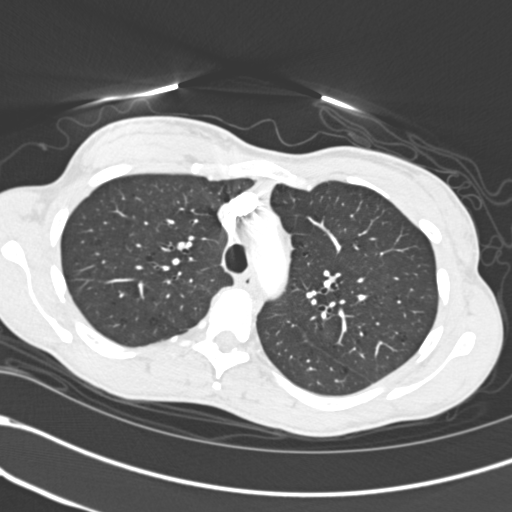
[im 185/251  mediastinal]
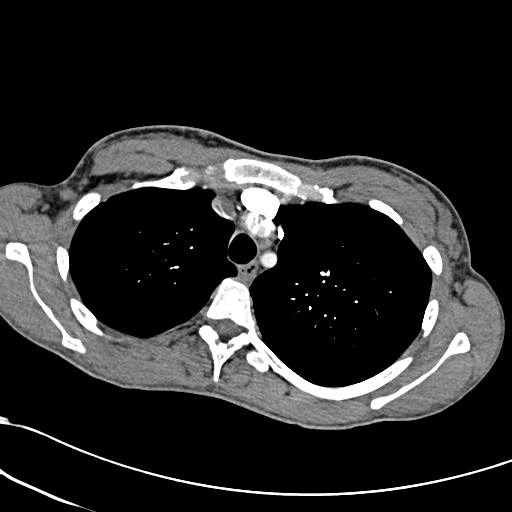
[im 198/251  lung]
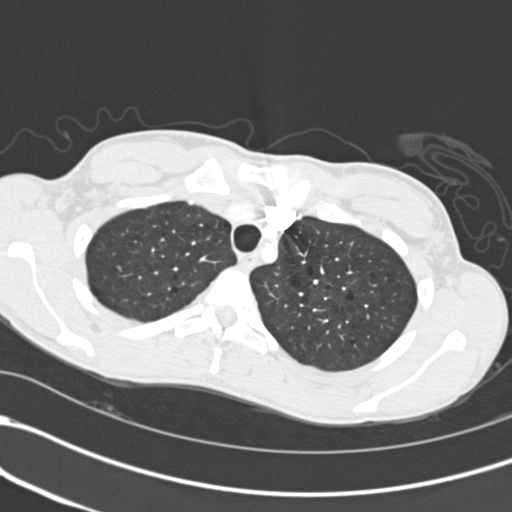
[im 211/251  mediastinal]
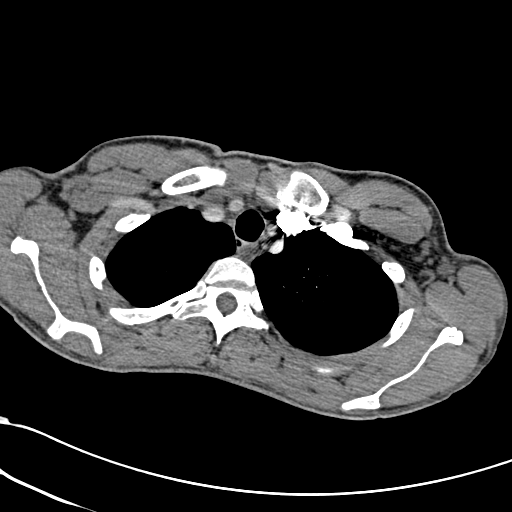
[im 224/251  lung]
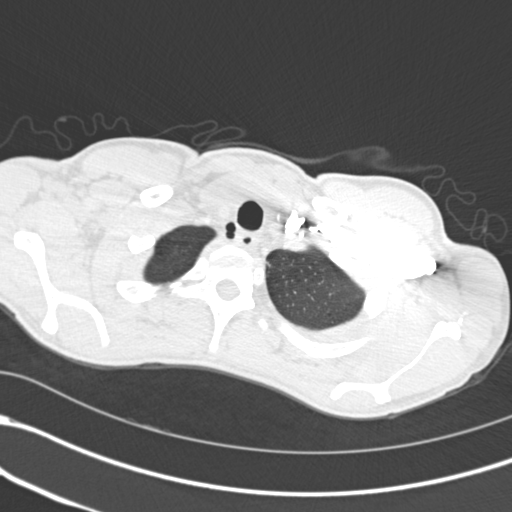
[im 237/251  mediastinal]
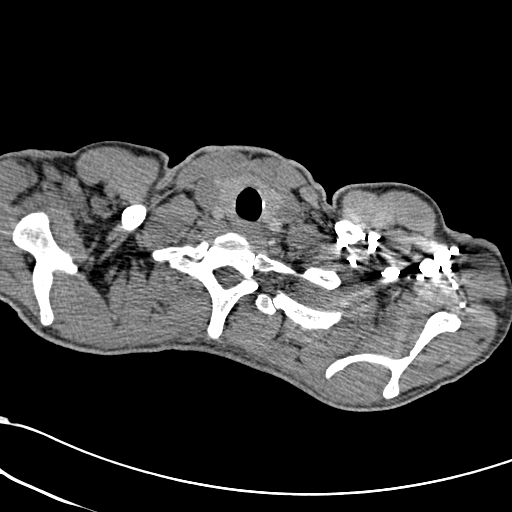

[Series 602: <mpr thick range> · coronal · 0.59mm/px · 1 of 89 slices shown]
[im 45/89  mediastinal]
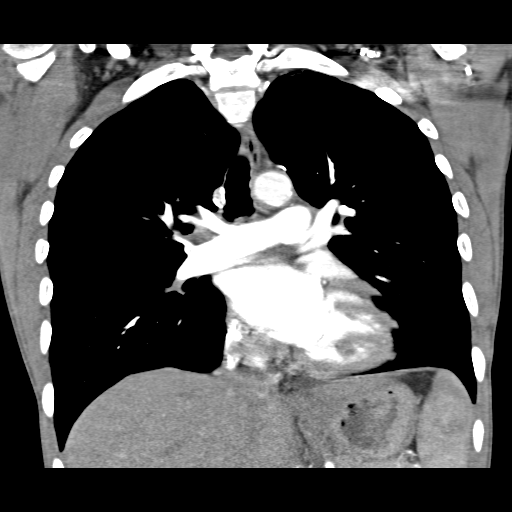

[19 of 36 positions shown; findings below may reference images not displayed]

FINDINGS: There are no pulmonary emboli, infiltrates, or
effusions.  There is diffuse mild emphysematous disease with
numerous small blebs in both lungs.

There is no hilar or mediastinal adenopathy.  No bony abnormality.
Heart size is normal.  Review of the MIP images confirms the above
findings.
IMPRESSION: Emphysema.  Otherwise normal chest.  No pulmonary emboli.

## 2012-03-21 IMAGING — US US ABDOMEN COMPLETE
1 series · 13 of 25 positions shown · non-contrast
Comparison: Abdominal ultrasound performed 03/22/2010, and CT of
the abdomen and pelvis performed 04/05/2010

CLINICAL DATA: Right upper quadrant abdominal pain.

ABDOMINAL ULTRASOUND COMPLETE

[Series 1: us abdomen complete · 0.32mm/px · 13 of 52 slices shown]
[im 1/52]
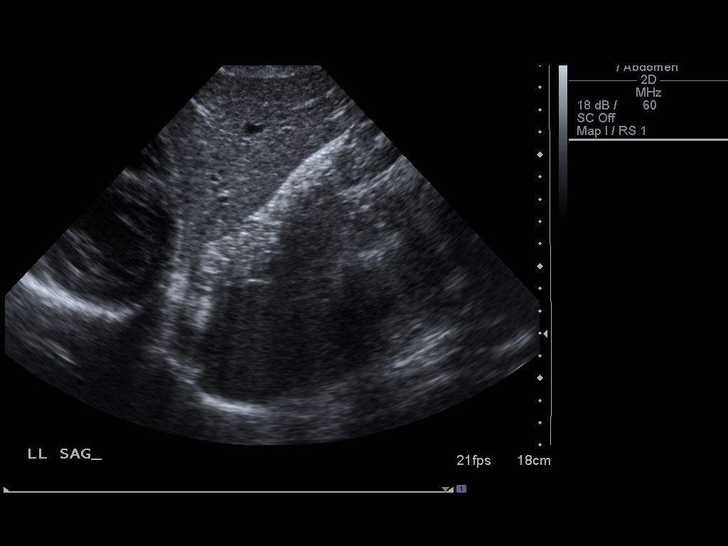
[im 5/52]
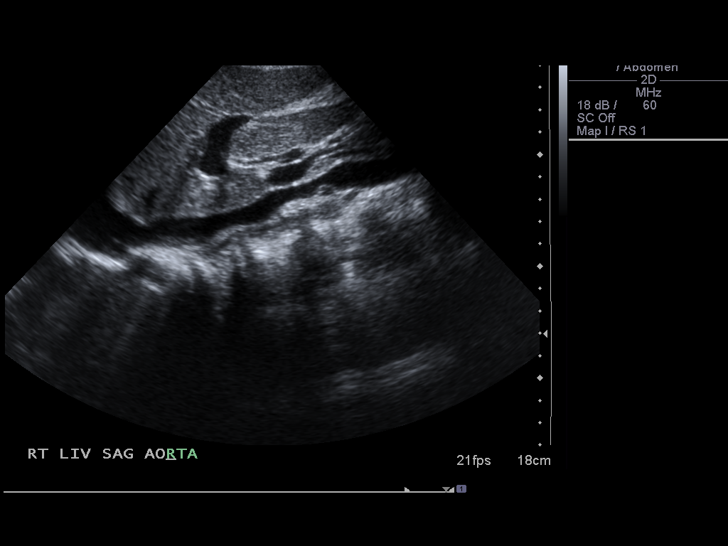
[im 9/52]
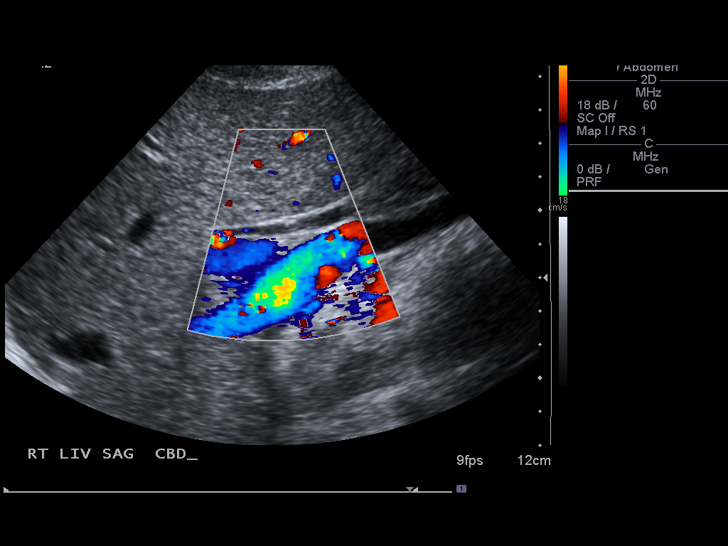
[im 13/52]
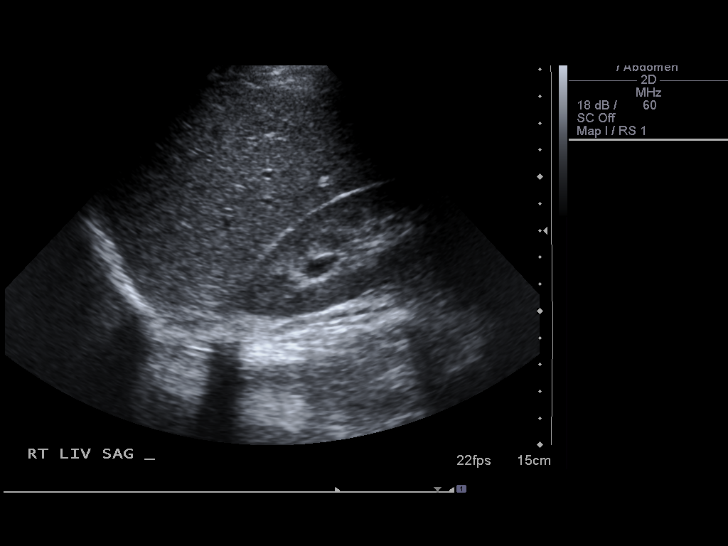
[im 18/52]
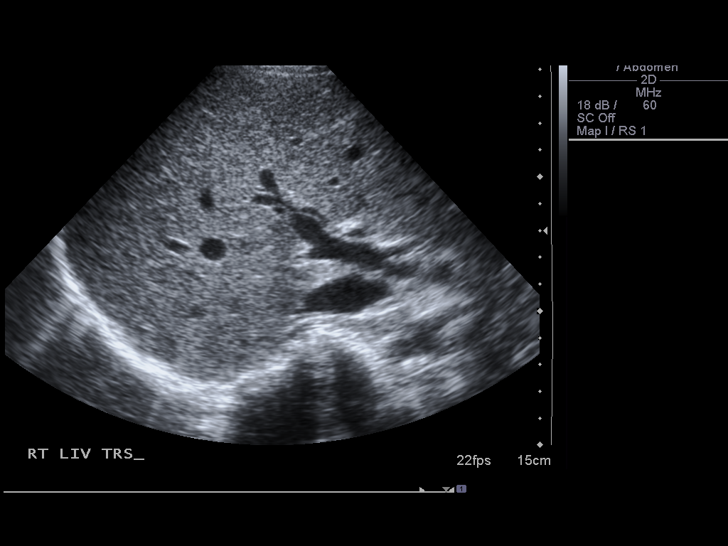
[im 22/52]
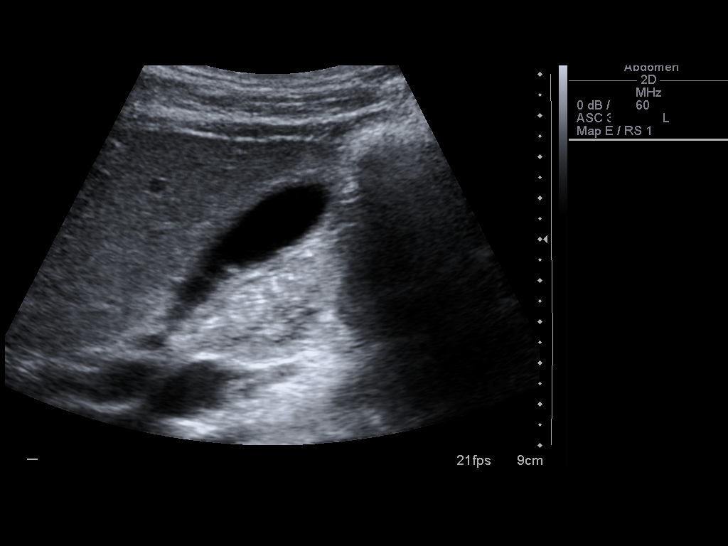
[im 26/52]
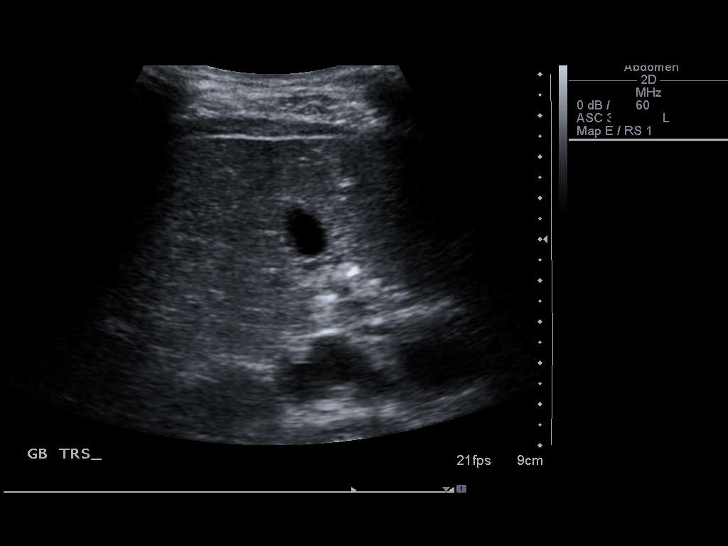
[im 30/52]
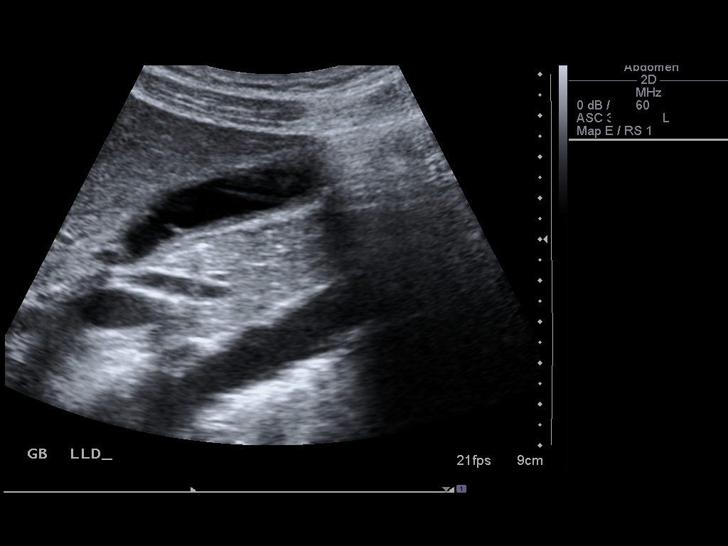
[im 35/52]
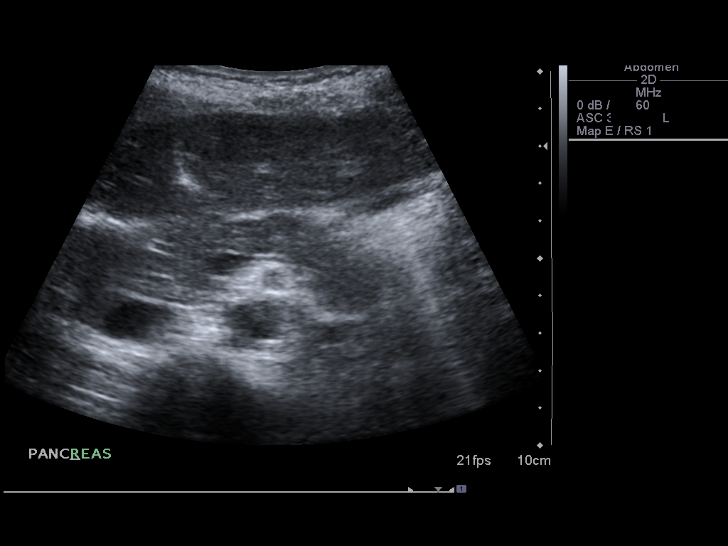
[im 39/52]
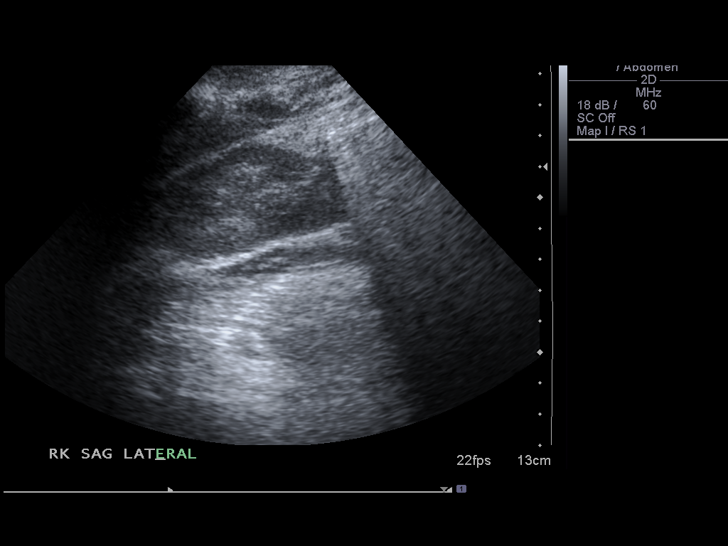
[im 43/52]
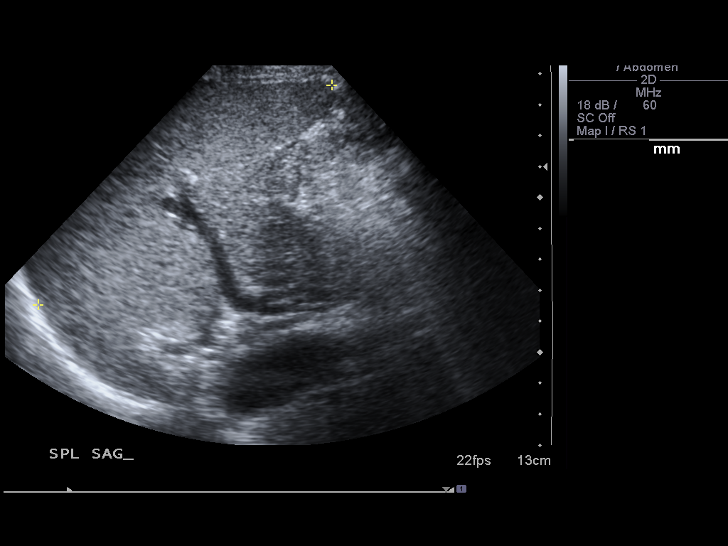
[im 47/52]
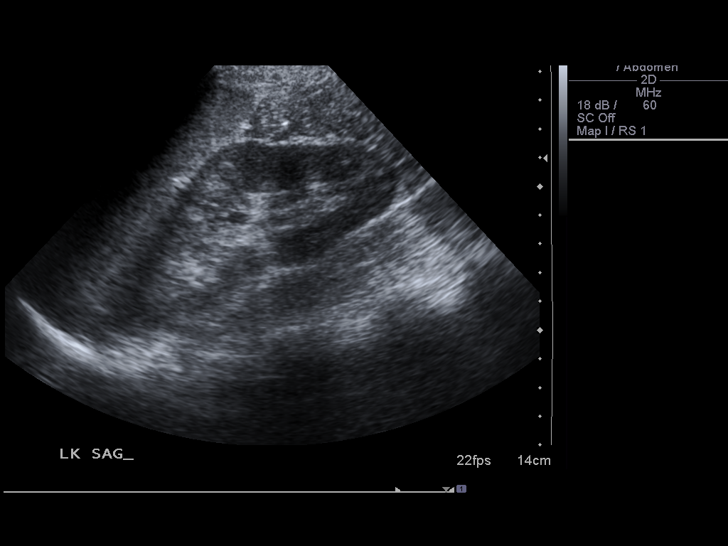
[im 52/52]
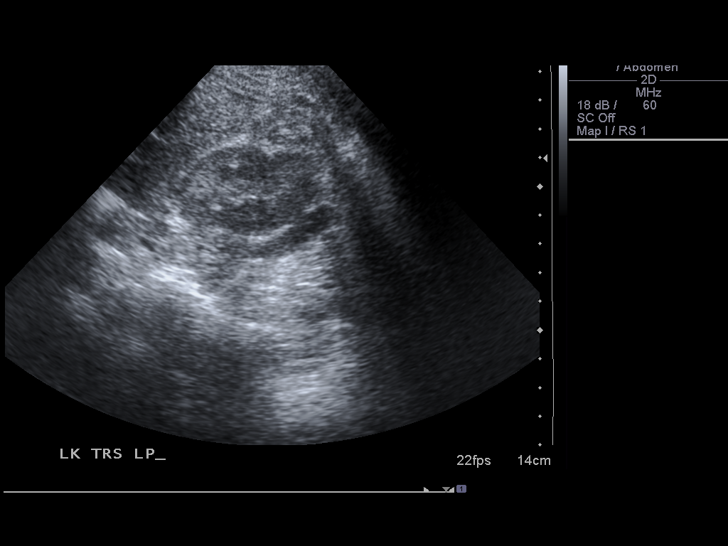

[13 of 25 positions shown; findings below may reference images not displayed]

FINDINGS: Gallbladder:  There is mild irregularity along the dependent wall
of the gallbladder; no significant gallbladder wall thickening is
seen to suggest a mass, and this may reflect several septations and
some echogenic sludge.  On correlation with the prior study,
similar findings were present on a few images.

No definite stones are identified.  No pericholecystic fluid is
seen.  There is an apparent positive ultrasonographic [name]'s
sign, although the patient also experiences tenderness at the right
flank.

Common Bile Duct:  0.5 cm in diameter; within normal limits in
caliber.

Liver:  Normal parenchymal echogenicity and echotexture; no focal
lesions identified.  Limited Doppler evaluation demonstrates normal
blood flow within the liver.

IVC:  Unremarkable in appearance.

Pancreas:  Although the pancreas is difficult to visualize in its
entirety due to overlying bowel gas, no focal pancreatic
abnormality is identified.

Spleen:  11.9 cm in length; within normal limits in size and
echotexture.

Right kidney:  11.3 cm in length; normal in size, configuration and
parenchymal echogenicity.  No evidence of mass or hydronephrosis.

Left kidney:  11.7 cm in length; normal in size, configuration and
parenchymal echogenicity.  No evidence of mass or hydronephrosis.

Abdominal Aorta:  Normal in caliber; no aneurysm identified.
IMPRESSION: 1. Mild irregularity along the dependent wall of the gallbladder
likely reflects several septations and some echogenic sludge.  No
definite stones seen.  No evidence of gallbladder wall thickening
or pericholecystic fluid to suggest acute cholecystitis. Although
an apparent positive ultrasonographic [name]'s sign is elicited,
the patient also experiences focal tenderness at the right flank.
2.  Otherwise unremarkable abdominal ultrasound.

## 2012-03-25 IMAGING — CR DG HIP (WITH OR WITHOUT PELVIS) 2-3V*L*
3 series · 3 of 3 positions shown · non-contrast
Comparison: None.

CLINICAL DATA: Left hip pain and infection for 2 days.

LEFT HIP - COMPLETE 2+ VIEW

[t pelvis a.p.]
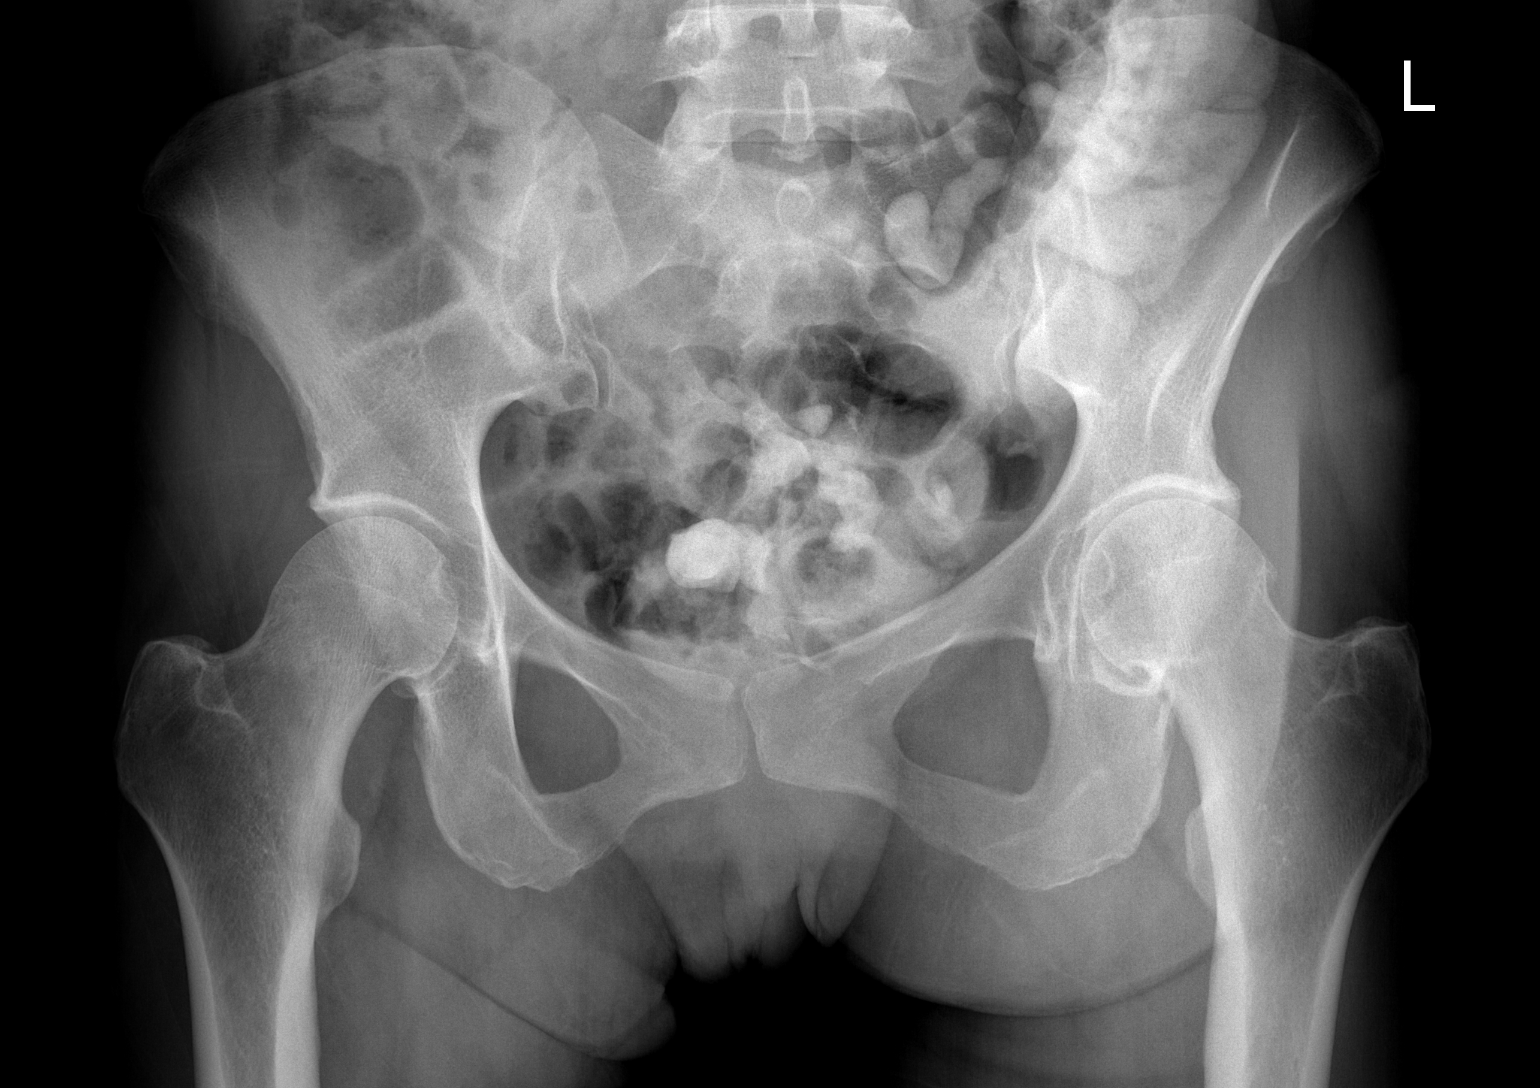

[t hip ap left]
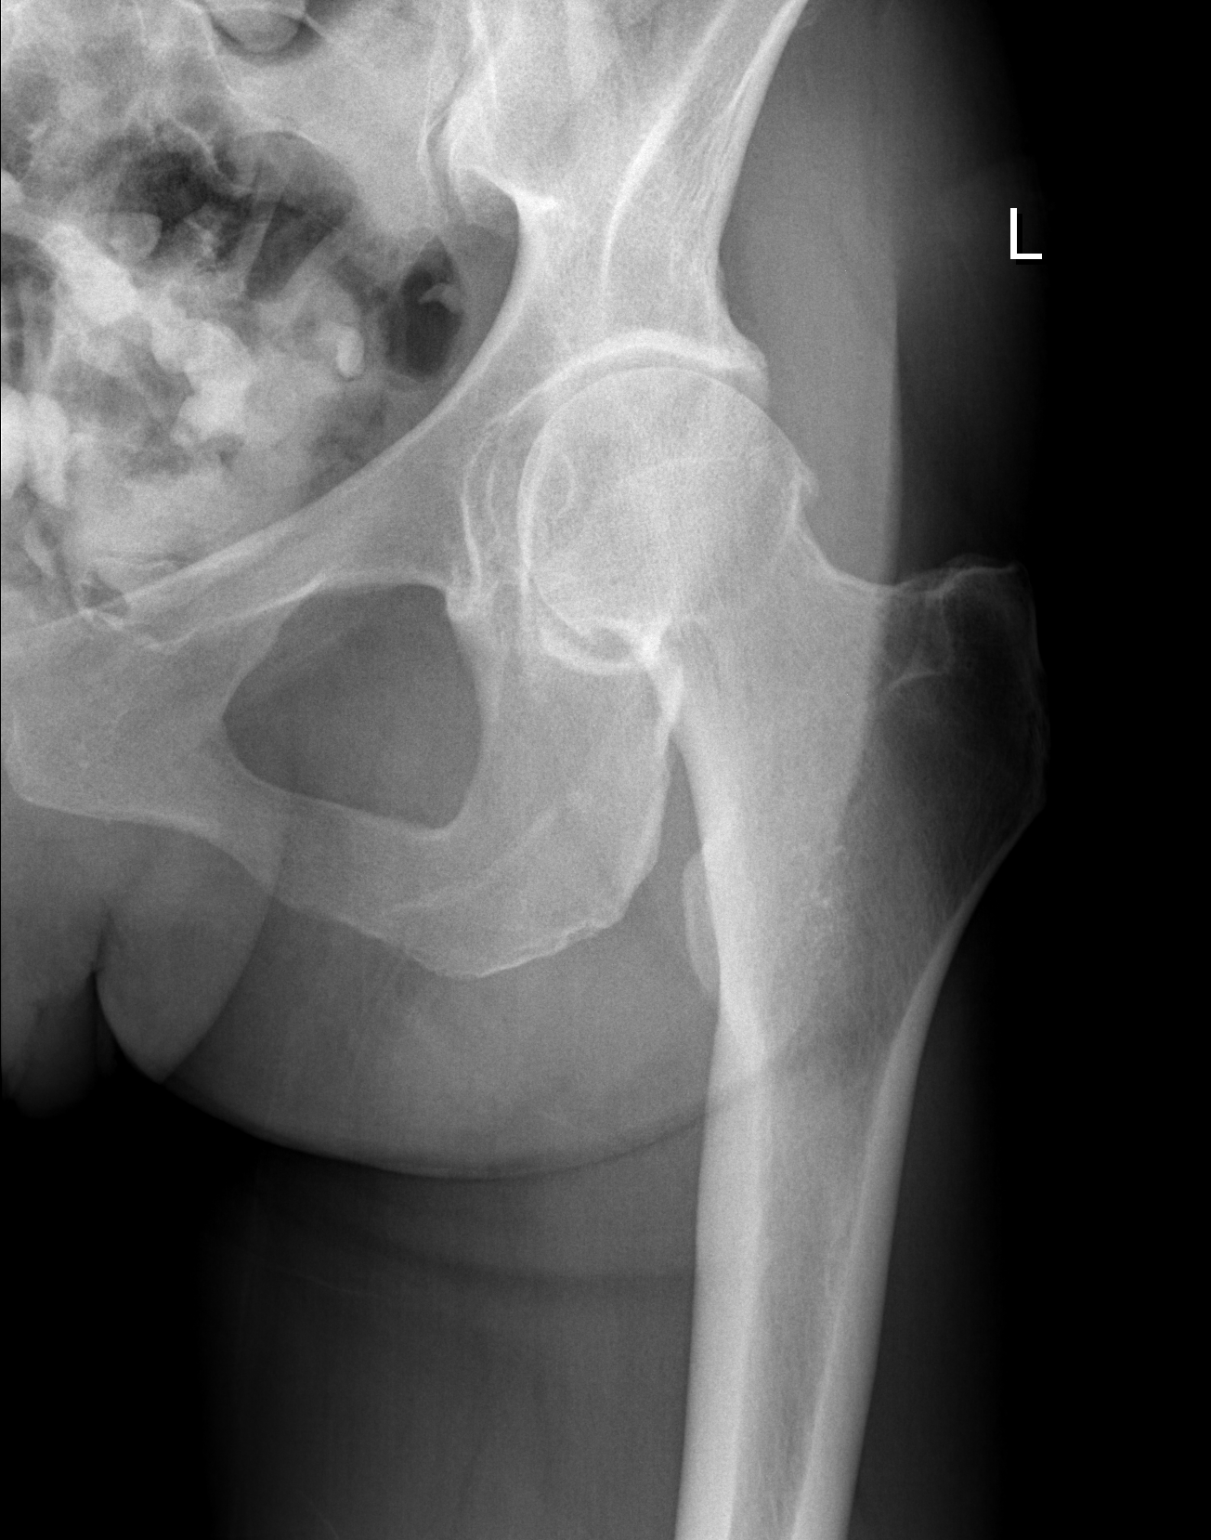

[t hip frog leg left]
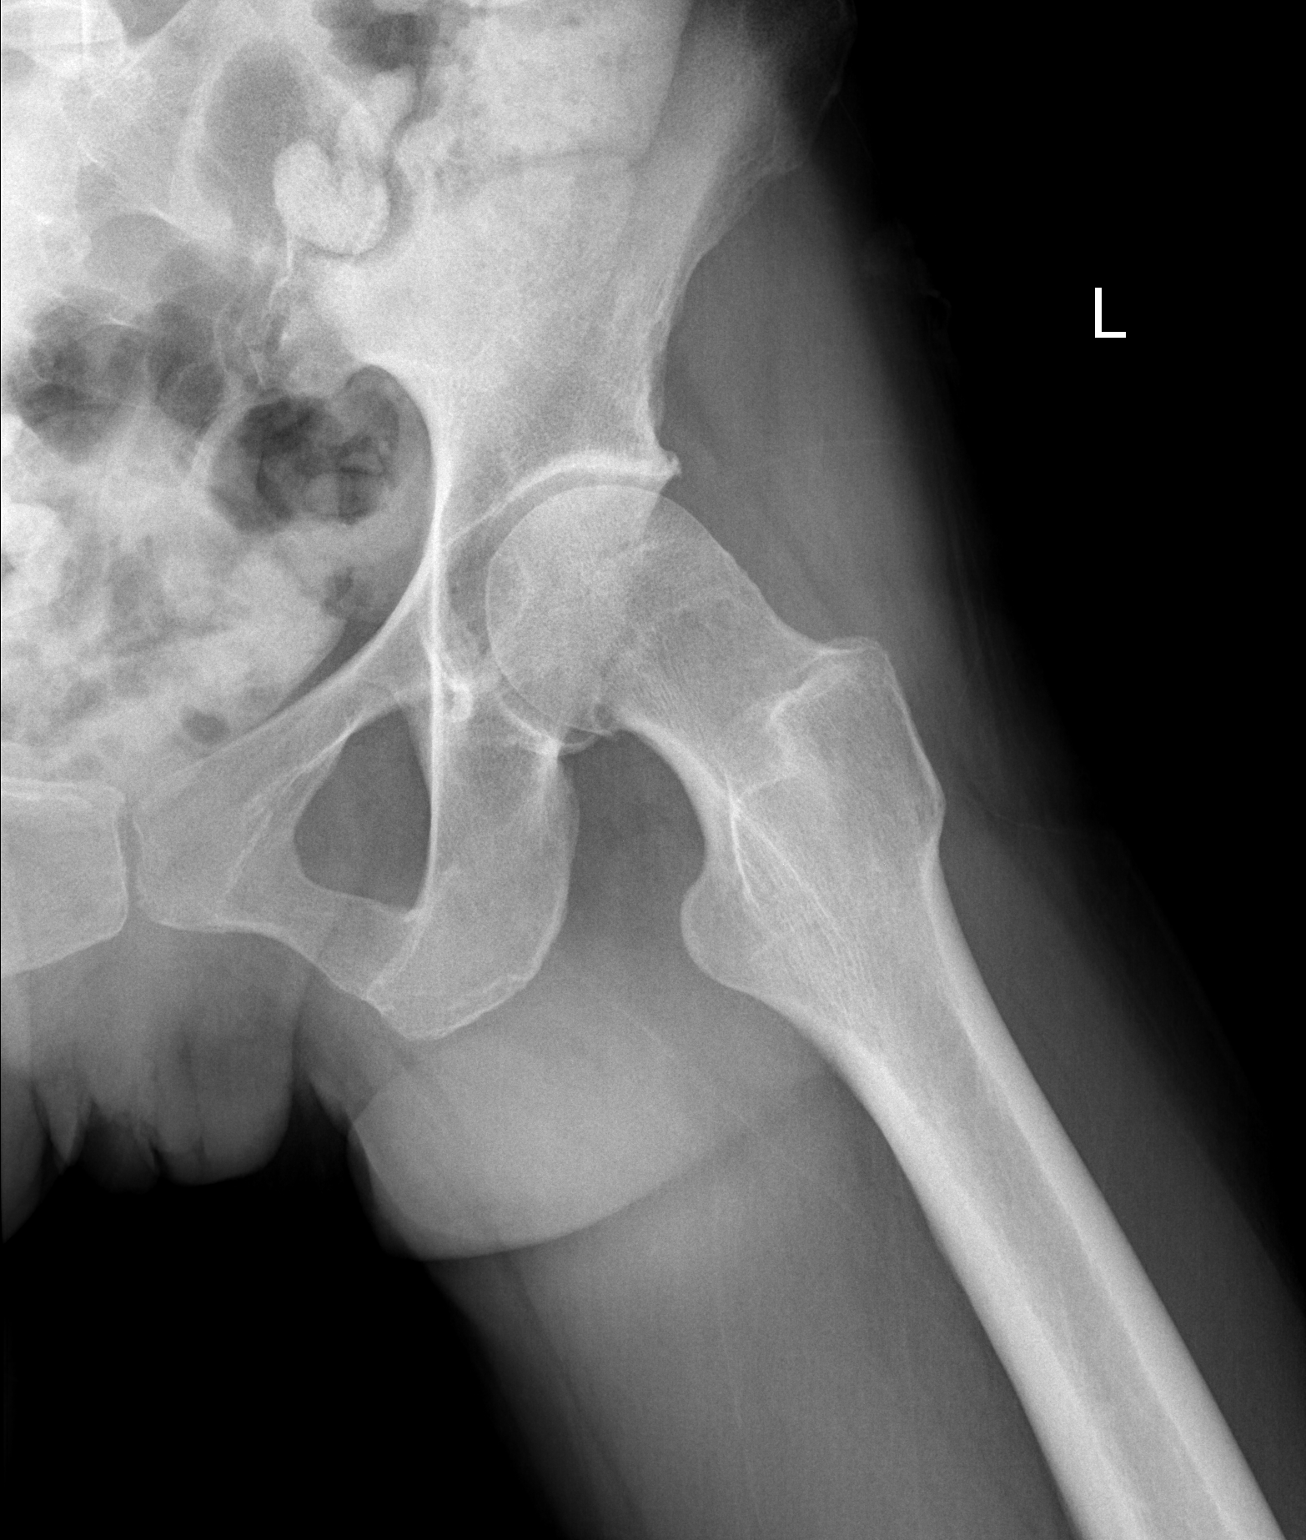

[3 of 3 positions shown; findings below may reference images not displayed]

FINDINGS: There are mild degenerative changes of the left hip joint
with slight spurring of the acetabulum and femoral head.  There is
no bone destruction or appreciable joint effusion.
IMPRESSION: Mild degenerative arthritic changes of the left hip.  No acute
abnormalities.

## 2012-03-28 IMAGING — CR DG CHEST 2V
2 series · 2 of 2 positions shown · non-contrast
Comparison: CT 04/07/2010 and earlier studies

CLINICAL DATA: Fever, malnutrition

CHEST - 2 VIEW

[w chest pa]
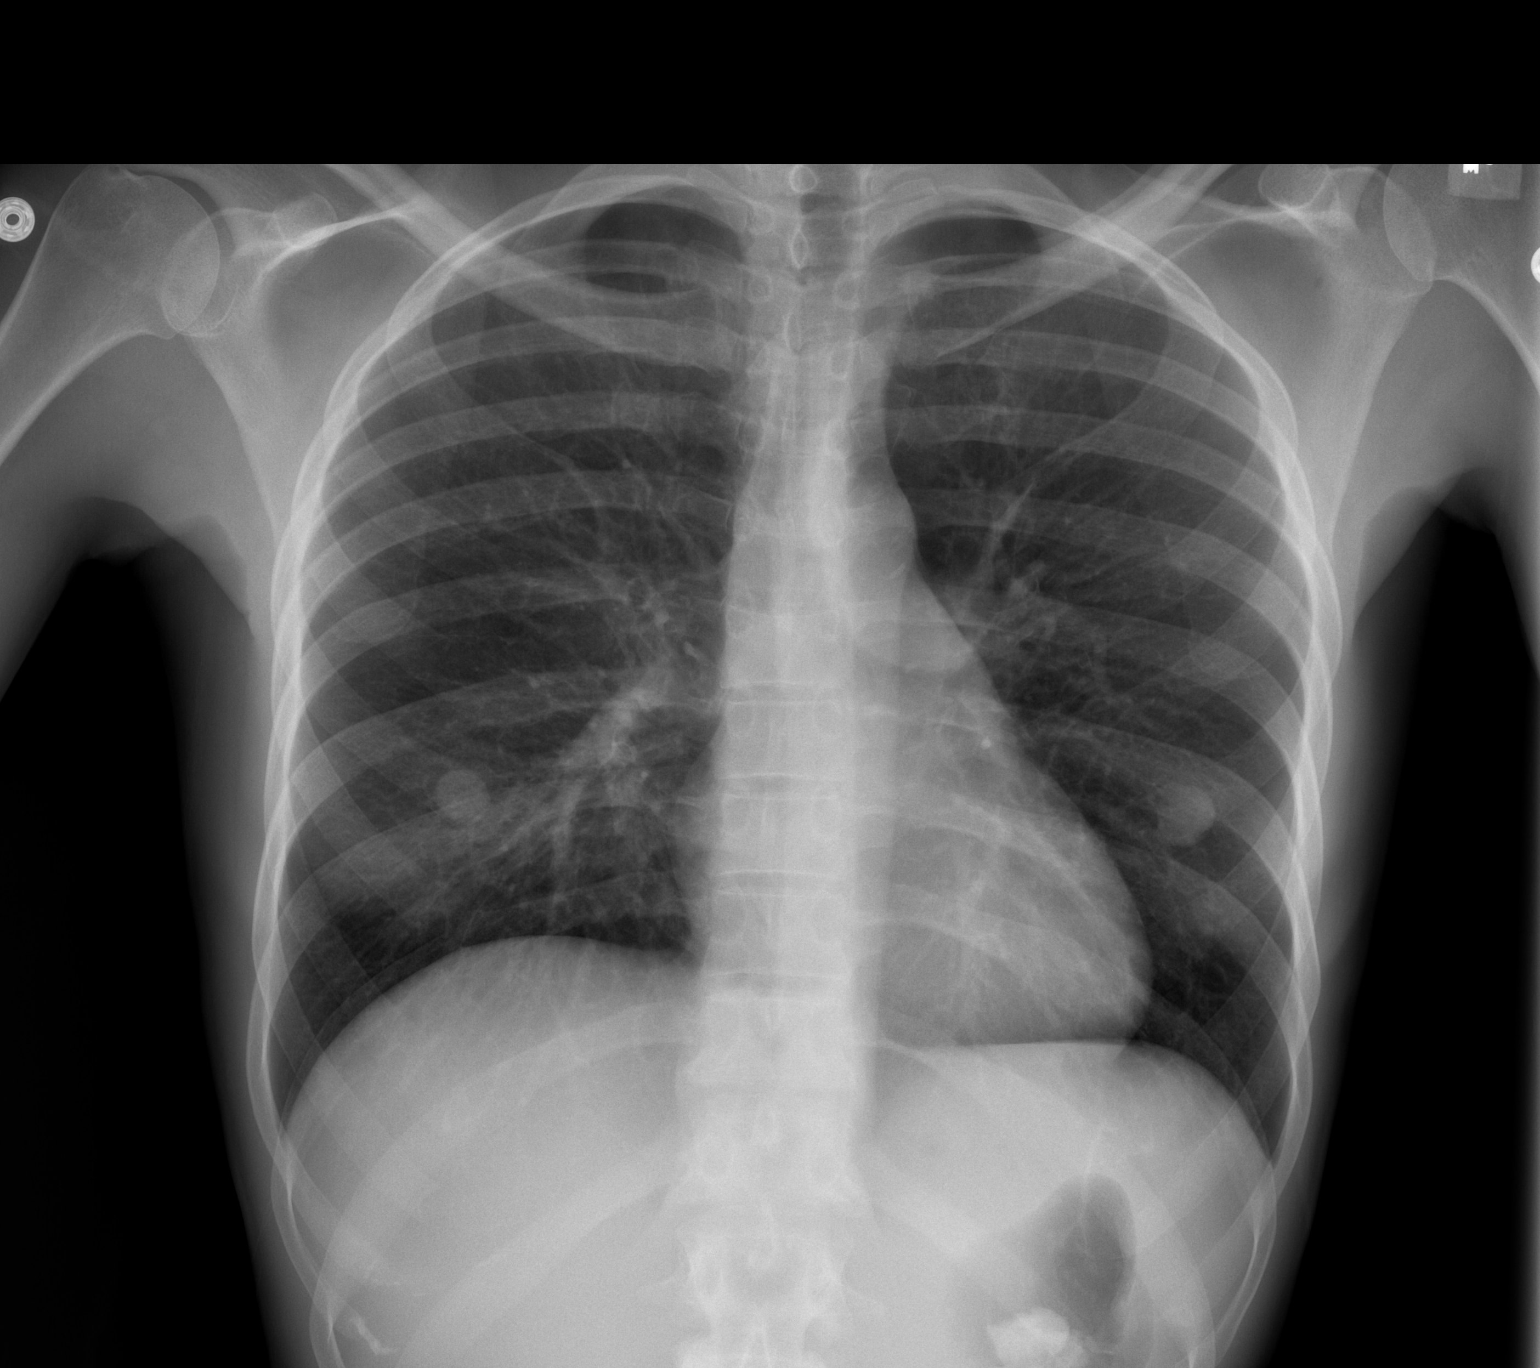

[w chest lat]
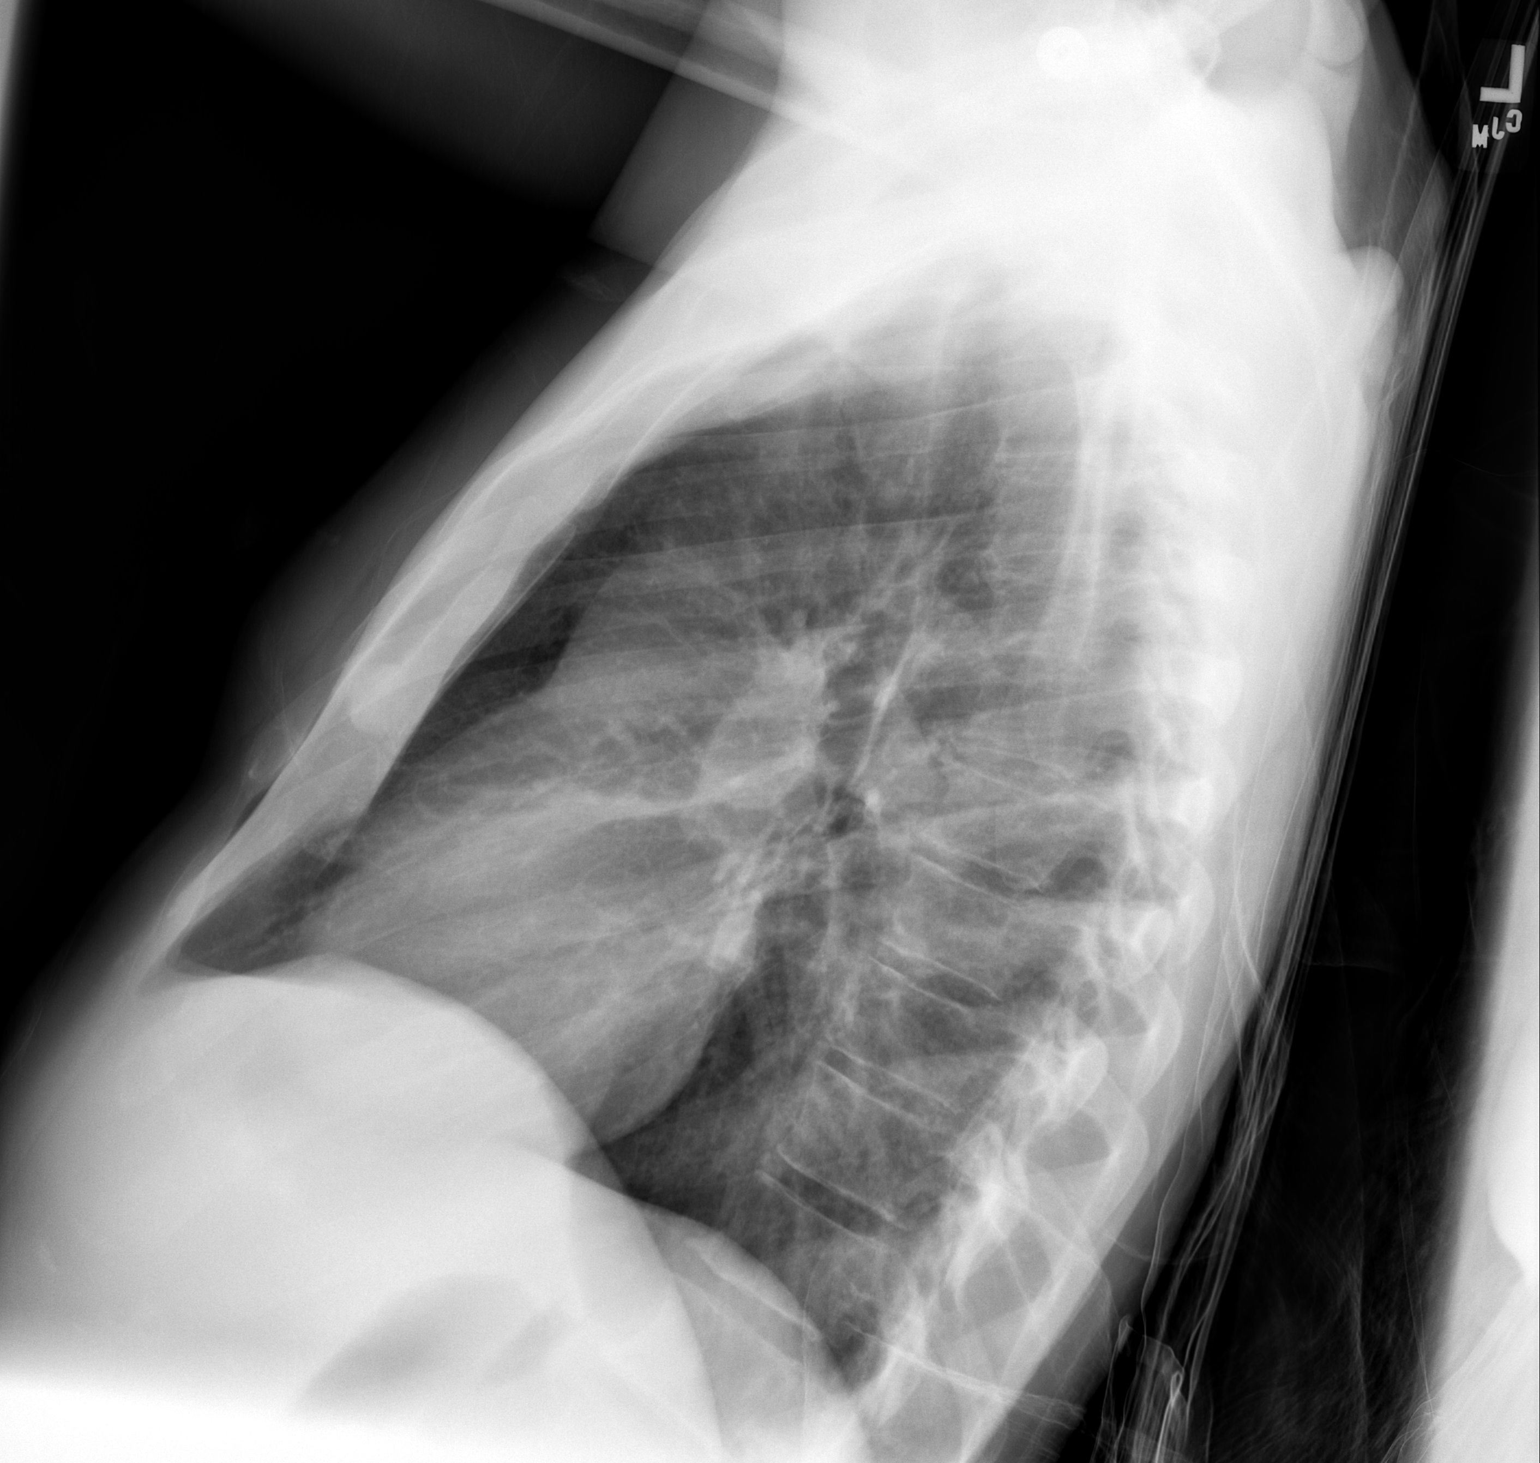

[2 of 2 positions shown; findings below may reference images not displayed]

FINDINGS: Lungs clear.  Heart size and pulmonary vascularity
normal.  No effusion.  Visualized bones unremarkable.
IMPRESSION: No acute disease

## 2012-03-30 ENCOUNTER — Emergency Department (HOSPITAL_COMMUNITY)
Admission: EM | Admit: 2012-03-30 | Discharge: 2012-03-30 | Disposition: A | Discharge status: Rehabilitation Facility | Payer: Medicaid Other | Attending: Emergency Medicine | Admitting: Emergency Medicine

## 2012-03-30 DIAGNOSIS — M069 Rheumatoid arthritis, unspecified: Secondary | ICD-10-CM | POA: Insufficient documentation

## 2012-03-30 DIAGNOSIS — F329 Major depressive disorder, single episode, unspecified: Secondary | ICD-10-CM | POA: Insufficient documentation

## 2012-03-30 DIAGNOSIS — F1410 Cocaine abuse, uncomplicated: Secondary | ICD-10-CM | POA: Insufficient documentation

## 2012-03-30 DIAGNOSIS — H9209 Otalgia, unspecified ear: Secondary | ICD-10-CM | POA: Insufficient documentation

## 2012-03-30 DIAGNOSIS — F3289 Other specified depressive episodes: Secondary | ICD-10-CM | POA: Insufficient documentation

## 2012-03-30 DIAGNOSIS — F17200 Nicotine dependence, unspecified, uncomplicated: Secondary | ICD-10-CM | POA: Insufficient documentation

## 2012-03-30 DIAGNOSIS — I10 Essential (primary) hypertension: Secondary | ICD-10-CM | POA: Insufficient documentation

## 2012-03-30 DIAGNOSIS — R079 Chest pain, unspecified: Secondary | ICD-10-CM | POA: Insufficient documentation

## 2012-03-30 DIAGNOSIS — I776 Arteritis, unspecified: Secondary | ICD-10-CM | POA: Insufficient documentation

## 2012-03-30 LAB — TROPONIN I: Troponin I: <0.3 ng/mL (ref <0.30)

## 2012-03-30 MED ORDER — PREDNISONE 20 MG PO TABS
60.0000 mg | ORAL_TABLET | Freq: Once | ORAL | Status: AC
  Administered 2012-03-30: 60 mg via ORAL
  Filled 2012-03-30: qty 3

## 2012-03-30 MED ORDER — METHYLPREDNISOLONE 4 MG PO KIT: PACK | ORAL | Status: AC

## 2012-03-30 NOTE — ED Provider Notes (Signed)
History     CSN: [id number]  Arrival date & time 03/30/12  3007   First MD Initiated Contact with Patient 03/30/12 [phone]      Chief Complaint  Patient presents with  . Otalgia  . Chest Pain    (Consider location/radiation/quality/duration/timing/severity/associated sxs/prior treatment) Patient is a 51 y.o. female presenting with ear pain and chest pain. The history is provided by the patient.  Otalgia This is a chronic problem. Pertinent negatives include no headaches, no abdominal pain, no diarrhea, no vomiting and no rash.  Chest Pain Pertinent negatives for primary symptoms include no shortness of breath, no abdominal pain, no nausea and no vomiting.  Pertinent negatives for associated symptoms include no numbness and no weakness.    patient has an episode of chest pain that is resolved. This is her typical chest pain. She has a recent negative stress test. She last used cocaine yesterday. No fevers. No cough. She states her ears are bothering her too. 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 She is followed by outpatient clinic. She last used her cocaine yesterday. She states her ear is always hurt, but they're hurting more.  Past Medical History  Diagnosis Date  . 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 Followed by Dr. Louanne Skye  . Hypertension   . Cocaine abuse     ongoing with resultant vaculitis.  . Tobacco abuse   . Depression   . Normocytic anemia     BL Hgb 9.8-12. Last anemia panel 04/2010 - showing Fe 19, ferritin 101.  Pt on monthly B12 injections  . Rheumatoid arthritis     patient reported    Past Surgical History  Procedure Date  . Skin biopsy     bilateral shin nodules on 04/2010  . Hernia repair     Family History  Problem Relation Age of Onset  . Breast cancer Mother     Breast cancer  . Alcohol abuse Mother   . Colon cancer Maternal Aunt 54  . Alcohol abuse Father     History  Substance Use Topics  . Smoking  status: Current Everyday Smoker -- 0.2 packs/day for 39 years    Types: Cigarettes  . Smokeless tobacco: Never Used  . Alcohol Use: No    OB History    Grav Para Term Preterm Abortions TAB SAB Ect Mult Living                  Review of Systems  Constitutional: Negative for activity change and appetite change.  HENT: Positive for ear pain. Negative for neck stiffness.   Eyes: Negative for pain.  Respiratory: Negative for chest tightness and shortness of breath.   Cardiovascular: Positive for chest pain. Negative for leg swelling.  Gastrointestinal: Negative for nausea, vomiting, abdominal pain and diarrhea.  Genitourinary: Negative for flank pain.  Musculoskeletal: Negative for back pain.  Skin: Positive for wound. Negative for rash.  Neurological: Negative for weakness, numbness and headaches.  Psychiatric/Behavioral: Negative for behavioral problems.    Allergies  Acetaminophen  Home Medications   Current Outpatient Rx  Name Route Sig Dispense Refill  . AMLODIPINE BESYLATE 2.5 MG PO TABS Oral Take 1 tablet (2.5 mg total) by mouth daily. 30 tablet 5  . ASPIRIN 81 MG PO TBEC Oral Take 1 tablet (81 mg total) by mouth daily. 30 tablet 3  . DIPHENHYDRAMINE HCL 25 MG PO CAPS Oral Take 25 mg by mouth every 6 (six) hours as  needed. itching     . FLUOXETINE HCL 20 MG PO CAPS Oral Take 40 mg by mouth daily.    Marland Kitchen KETOROLAC TROMETHAMINE 10 MG PO TABS Oral Take 10 mg by mouth every 6 (six) hours as needed. For pain    . LISINOPRIL 20 MG PO TABS Oral Take 20 mg by mouth daily. For blood pressure     . ADULT MULTIVITAMIN W/MINERALS CH Oral Take 1 tablet by mouth daily.    . TRAMADOL HCL 50 MG PO TABS Oral Take 1 tablet (50 mg total) by mouth every 6 (six) hours as needed. For pain 30 tablet 0  . VITAMIN B-12 1000 MCG PO TABS Oral Take 1,000 mcg by mouth daily.      Marland Kitchen ZOLPIDEM TARTRATE 10 MG PO TABS Oral Take 1 tablet (10 mg total) by mouth at bedtime. 30 tablet 0  . METHYLPREDNISOLONE 4  MG PO KIT  follow package directions 21 tablet 0    BP 137/78  Pulse 64  Temp 98.9 F (37.2 C) (Oral)  Resp 20  SpO2 100%  Physical Exam  Constitutional: She appears well-developed and well-nourished.  HENT:  Head: Normocephalic.  Eyes: Pupils are equal, round, and reactive to light.  Neck: Neck supple.  Cardiovascular: Normal rate and regular rhythm.   Pulmonary/Chest: Effort normal and breath sounds normal.  Abdominal: Soft. Bowel sounds are normal.  Musculoskeletal: Normal range of motion.  Neurological: She is alert.  Skin: Skin is warm.       Chronic changes of bilateral ears externally. Likely vasculitic changes.    ED Course  Procedures (including critical care time)   Labs Reviewed  TROPONIN I   No results found.   1. Chest pain   2. VASCULITIS      Date: 03/30/2012  Rate: 63  Rhythm: normal sinus rhythm  QRS Axis: normal  Intervals: normal  ST/T Wave abnormalities: nonspecific ST/T changes  Conduction Disutrbances:none  Narrative Interpretation:   Old EKG Reviewed: unchanged    MDM  Patient with chest pain. Negative troponin and recent negative stress test. EKG is stable. Pain patient's bilateral ears. Likely related to her vasculitis. Discussion with primary care Dr. and will start steroids.        Jasper Riling. Alvino Chapel, MD 03/30/12 [phone]

## 2012-03-30 NOTE — ED Notes (Signed)
Report received, assumed care.  

## 2012-03-30 NOTE — ED Notes (Signed)
Patient transported by [facility] for bilateral ear pain and left chest pain x 4 days.  Patient denies any shortness of breath.  No nausea, vomiting, or diarrhea.  Patient is a resident of [facility].

## 2012-03-30 NOTE — Discharge Instructions (Signed)
Vasculitis Vasculitis is when your blood vessels are inflamed. There are many different blood vessels in the body, and vasculitis can affect any of them. This includes large (veins and arteries) and small (capillaries) vessels. With vasculitis,   Blood vessel walls can become thick.   Blood vessels can become narrow.   Blood vessels can become weak. Sometimes, it becomes so weak that the blood vessel bulges out like a balloon. This is called an aneurysm. Aneurysms are rare but can be life-threatening.   Scarring can occur.   Not enough blood can flow through the blood vessels.  All of these things can damage many parts of the body, including the muscles, kidneys, lungs and brain. There are many types of vasculitis. Some types are short-term (acute), while others are long-term (chronic). Some types may go away without treatment, and others may need to be treated for a long time.  CAUSES  Vasculitis occurs when the body's immune system (which fights germs and disease) makes a mistake. It attacks its own blood vessels. This causes inflammation (the body's way of reacting to injury or infection).   Why this happens is usually not known. The condition is then called primary vasculitis.   Sometimes, something triggers the inflammation. This is called secondary vasculitis. Possible causes include:   Infections.   An immune system disease. Examples include lupus, rheumatoid arthritis and scleroderma.   An allergic reaction to a medicine.   Cancer that affects blood cells. This includes leukemia and lymphoma.   Males and females of all ages and races can develop vasculitis. Some risk factors make vasculitis more likely. These include:   Smoking.   Stress.   Physical injury.  SYMPTOMS  There are more than 20 types of vasculitis. Symptoms of each type vary, but some symptoms are common.  Many people with vasculitis:   Have a fever.   Do not feel like eating.   Lose weight.   Feel  very tired.   Have aches and pains.   Feel weak.   Start to not have feeling (numbness) in an area.   Symptoms for some types of vasculitis also could be:   Sores in the mouth or eyes.   Skin problems. This could be sores, spots or rashes.   Trouble seeing.   Trouble breathing.   Blood in the urine.   Headaches.   Pain in the abdomen.   Stuffy or bloody nose.  DIAGNOSIS  Vasculitis symptoms are similar to symptoms of many other conditions. That can make it hard to tell if you have vasculitis. To be sure, your caregiver will ask about your symptoms and do a physical exam. Certain tests may be necessary, such as:   A complete blood count (CBC). This test shows how many red blood cells are in your blood. Not having enough red blood cells (anemic) can result from vasculitis.   Erythrocyte sedimentation (also called sed rate test). It measures inflammation in the body.   C reactive protein (CRP). This also shows if there is inflammation.   Anti-neutrophil cytoplasmic antibodies (ANCA). This can tell if the immune system is reacting to certain cells in the blood.   A urine test. This checks for blood or protein in the urine. That could be a sign of kidney damage from vasculitis.   Imaging tests. These tests create pictures from inside the body. Options include:   X-rays.   Computed tomography (CT) scan. This uses X-rays guided by a computer.   Ultrasound. It  creates an image using sound waves.   Magnetic resonance imaging (MRI). It uses radio waves, magnets and a computer.   Angiography. A dye is put into your blood vessels. Then, an X-ray is taken of them.   A biopsy of a blood vessel. This means your caregiver will take out a small piece of a blood vessel. Then, it is checked under a microscope. This is an important test. It often is the best way to know for sure if you have vasculitis.  TREATMENT   Treatment will depend on the type of vasculitis and how severe it is.  Often, you will need to see a specialist in immunologic diseases (rheumatologist).   Some types of vasculitis may go away without treatment.   Some types need only over-the-counter drugs.   Prescription medicines are used to treat many types of vasculitis. For example:   Corticosteroids. These are the drugs used most often. They are very powerful. Usually, a high dose is taken until symptoms improve. Then, the dose is gradually decreased. Using corticosteroids for a long time can cause problems. They can make muscles and bones weak. They can cause blood pressure to go up, and cause diabetes. Also, people often gain weight when they take corticosteroids.   Cytotoxic drugs. These kill cells that cause inflammation. Sometimes, they are used if corticosteroids do not help. Other times, both medications are taken.   Surgery. This may be needed to repair a blood vessel that has bulged out (aneurysm).   Treatment can sometimes cure your disease. Other times, it can put the disease in remission (no symptoms). Increased treatment and reevaluation might be necessary if your disease comes back or flares.  HOME CARE INSTRUCTIONS   Take any medications that your caregiver prescribes. Follow the directions carefully.   Watch for any problems that can be caused by a drug (side effects). Tell your caregiver right away if you notice any changes or problems.   Keep all appointments for checkups. This is important to help your caregiver watch for side effects. Checkups may include:   Periodic blood tests.   Bone density testing. This checks how strong or weak your bones are.   Blood pressure checks. If your blood pressure rises, you may need to take a drug to control it while you are taking corticosteroids.   Blood sugar checks. This is to be sure you are not developing diabetes. If you have diabetes, corticosteroid medications may make it worse and require increased treatment.   Exercise. First, talk  with your caregiver about what would be OK for you to do. Aerobic exercise (which increases your heart rate) is often suggested. It includes walking. This type of exercise is good because it helps prevent bone loss. It also helps control your blood pressure.   Follow a healthy diet. Include good sources of protein in your diet. Also include fruits, vegetables and whole grains. Your caregiver can refer you to an expert on healthy eating (dietitian) for more detailed advice.   Learn as much as you can about vasculitis. Understanding your condition can help you cope with it. Coping can be hard because this may be something you will have to live with for years.   Consider joining a support group. It often helps to talk about your worries with others who have the same problems.   Tell your caregiver if you feel stressed, anxious or depressed. Your caregiver may refer you to a specialist, or recommend medication to relieve your symptoms.  SEEK MEDICAL CARE IF:   The symptoms that led to your diagnosis return.   You develop worsening fever, fatigue, headache, weight loss or pain in your jaw.   You develop signs of infection. Infections can be worse if you are on corticosteroid medication.   You develop any new or unexplained symptoms of disease.  SEEK IMMEDIATE MEDICAL CARE IF:   Your eyesight changes.   Pain does not go away, even after taking medication.   You feel pain in your chest or abdomen.   You have trouble breathing.   One side of your face or body becomes suddenly weak or numb.   Your nose bleeds.   There is blood in your urine.   You develop a fever of more than 102 F (38.9 C).  Document Released: 07/18/2009 Document Revised: 09/10/2011 Document Reviewed: 07/18/2009 [facility] Patient Information 2012 [place].

## 2012-04-05 IMAGING — MR MR [PERSON_NAME] LOW WO/W CM*R*
10 of 13 series · 30 of 40 positions shown · IV contrast (multihance)
Comparison: Right ankle radiographs 01/30/2010.

CLINICAL DATA: Multifocal cellulitis and superficial abscesses in
the arms and legs.  Vasculitis.

MRI OF THE RIGHT LOWER LEG WITH AND WITHOUT CONTRAST
TECHNIQUE: Multiplanar, multisequence MR imaging of the right
lower leg was performed before and after the administration of
intravenous contrast.
Contrast: 10 ml Multihance.
TECHNIQUE: Multiplanar, multisequence MR imaging of the left

[Series 3: T1 · axial · 8.0mm · 0.57mm/px · z∈[-258,+192]mm · 4 of 46 slices shown (1 of 4)]
[im 1/46]
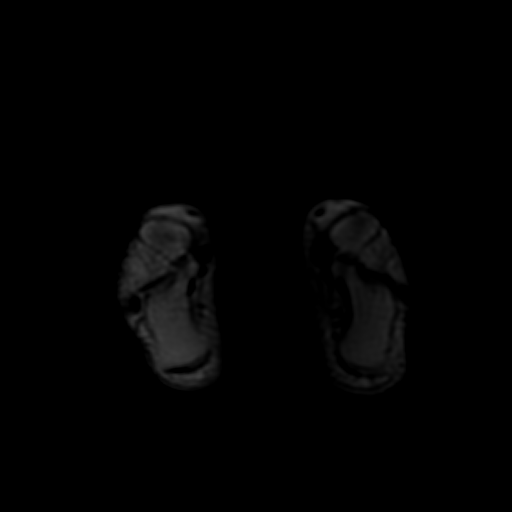
[im 16/46]
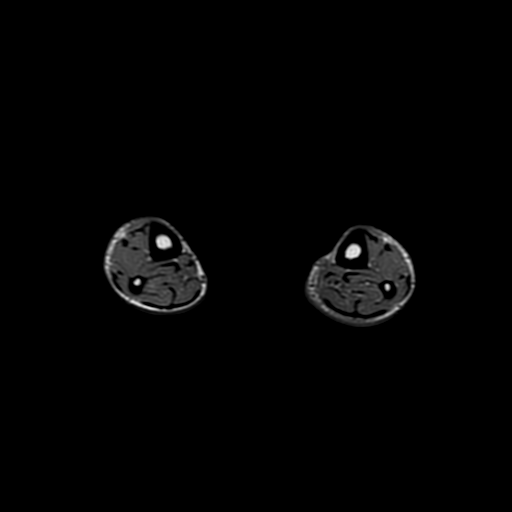
[im 31/46]
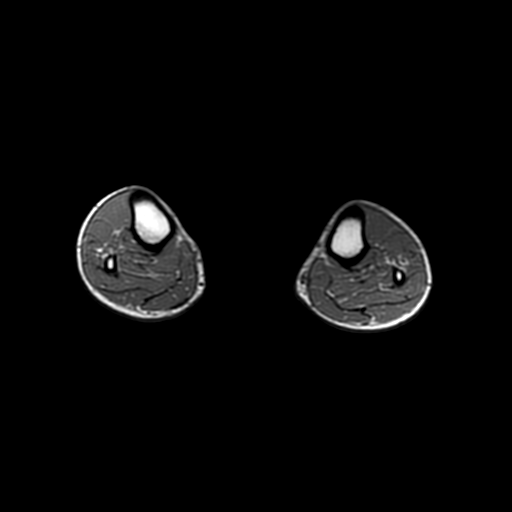
[im 46/46]
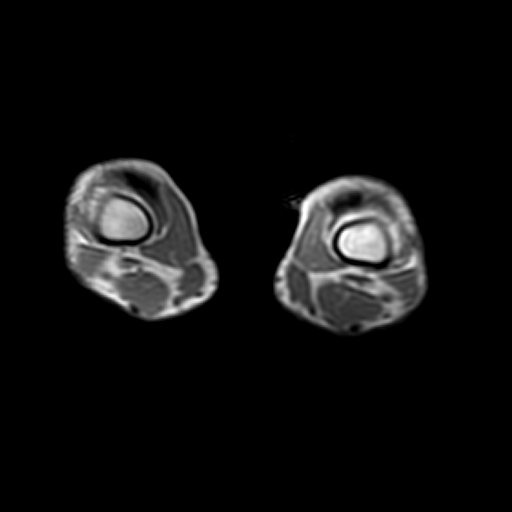

[Series 5: STIR · axial · 8.0mm · 1.13mm/px · z∈[-258,-148]mm · 2 of 46 slices shown]
[im 1/46]
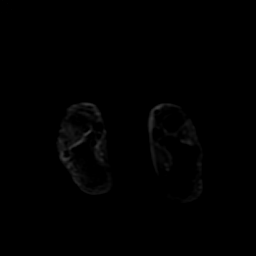
[im 12/46]
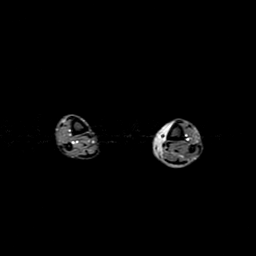

[Series 6: T1 · coronal · 3.0mm · 0.86mm/px · 3 of 26 slices shown (2 of 4)]
[im 1/26]
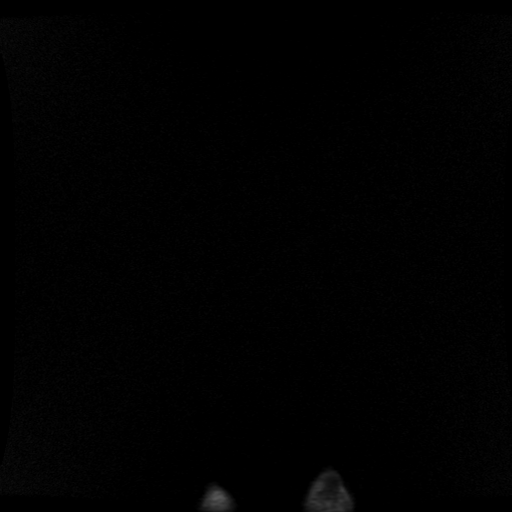
[im 13/26]
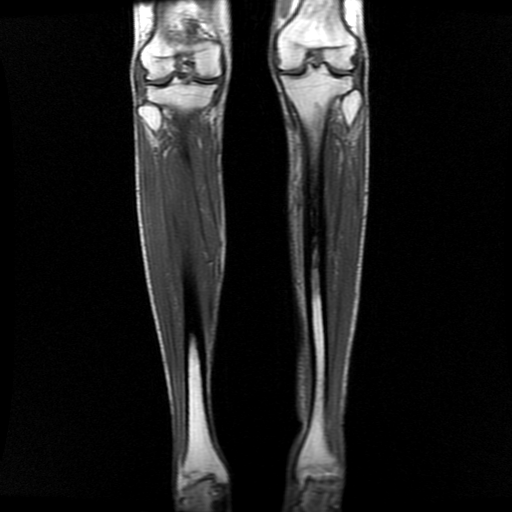
[im 26/26]
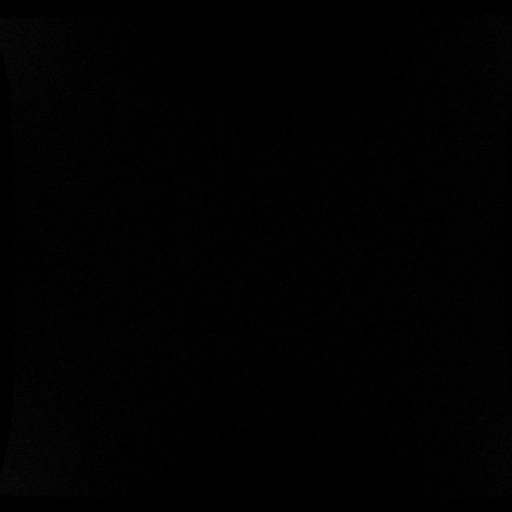

[Series 8: T1 · sagittal · 5.0mm · 0.86mm/px · 2 of 15 slices shown (3 of 4)]
[im 1/15]
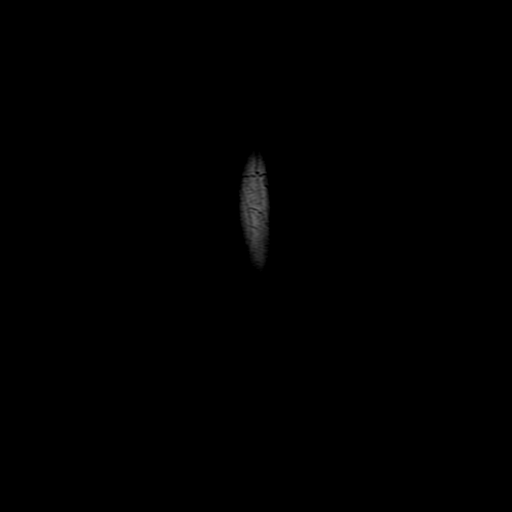
[im 15/15]
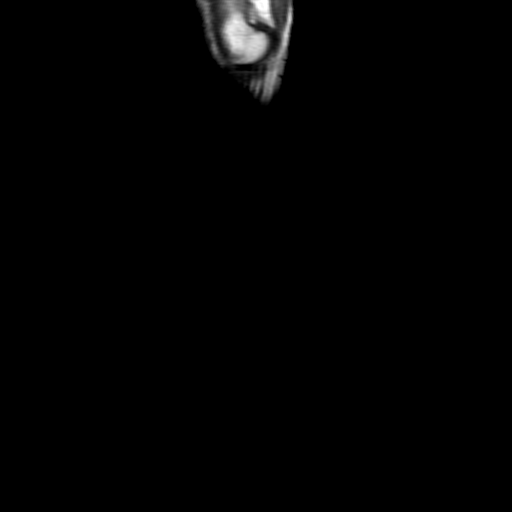

[Series 10: T1 · sagittal · 5.0mm · 0.86mm/px · 2 of 15 slices shown (4 of 4)]
[im 1/15]
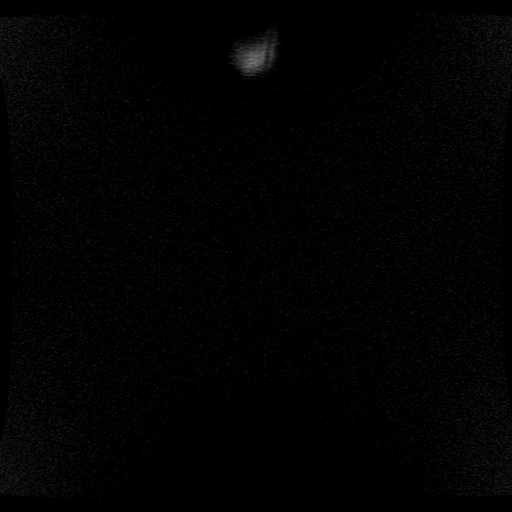
[im 15/15]
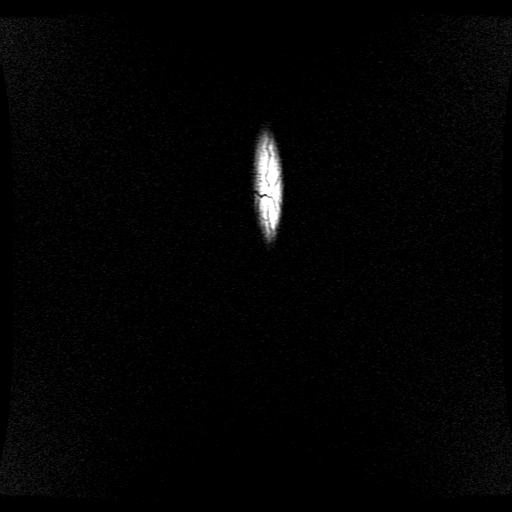

[Series 12: T1 fat-sat · axial · non-contrast · 8.0mm · 1.13mm/px · z∈[-258,+192]mm · 5 of 46 slices shown]
[im 1/46]
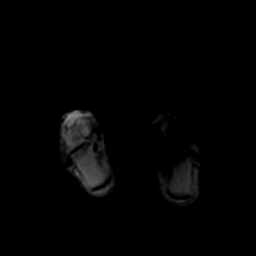
[im 12/46]
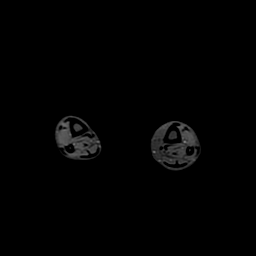
[im 23/46]
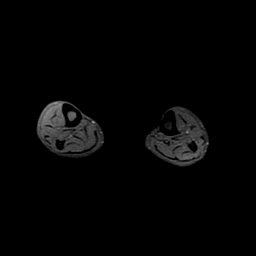
[im 34/46]
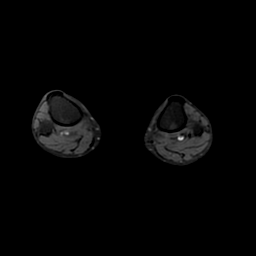
[im 46/46]
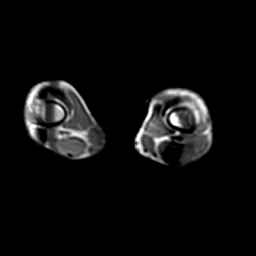

[Series 13: T1 fat-sat post-contrast · axial · 8.0mm · 1.13mm/px · z∈[-258,+192]mm · 5 of 46 slices shown (1 of 3)]
[im 1/46]
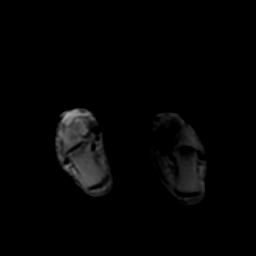
[im 12/46]
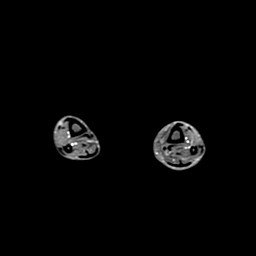
[im 23/46]
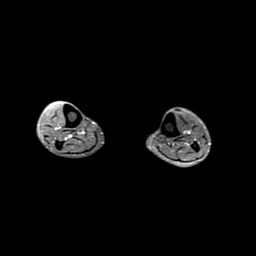
[im 34/46]
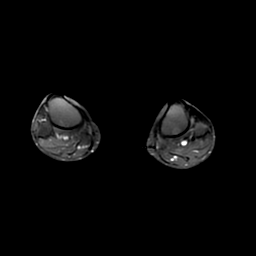
[im 46/46]
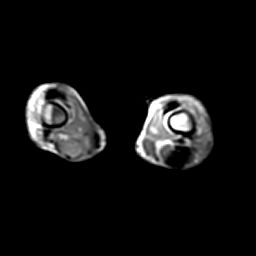

[Series 14: T1 fat-sat post-contrast · coronal · 3.0mm · 1.72mm/px · 3 of 26 slices shown (2 of 3)]
[im 1/26]
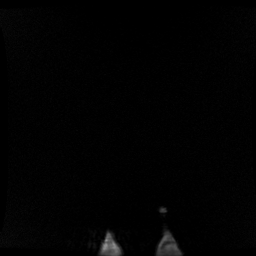
[im 13/26]
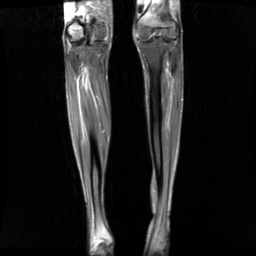
[im 26/26]
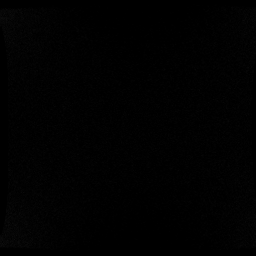

[Series 15: T1 fat-sat post-contrast · sagittal · 5.0mm · 1.72mm/px · 2 of 15 slices shown (3 of 3)]
[im 1/15]
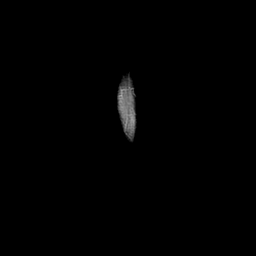
[im 15/15]
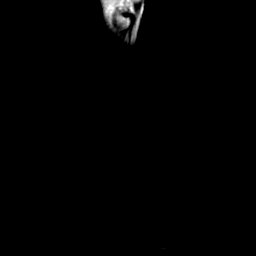

[Series 16: T1 post-contrast · sagittal · 5.0mm · 0.86mm/px · 2 of 15 slices shown]
[im 1/15]
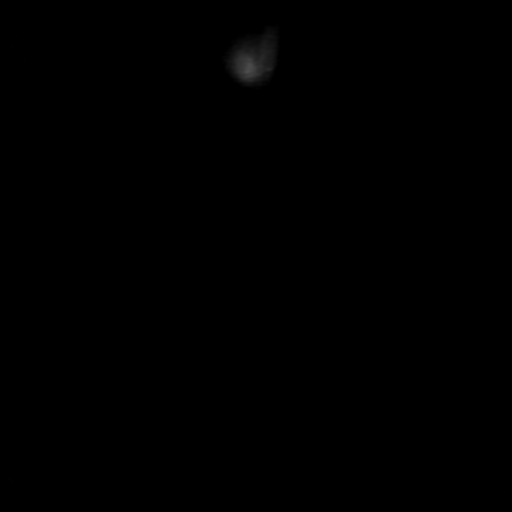
[im 15/15]
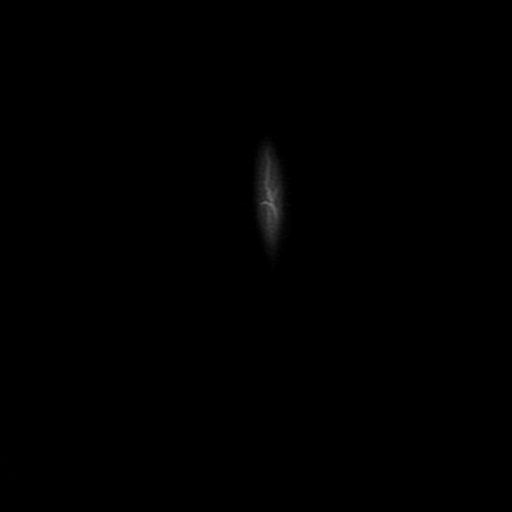

[30 of 40 positions shown; findings below may reference images not displayed]

FINDINGS: On the right side, there is mild subcutaneous edema and a
small ill-defined fluid collection anterior to the tibialis
anterior muscle in the proximal third of the right lower leg, best
seen on the coronal and sagittal images.  Fluid collection measures
11 mm maximally.  There is no edema or enhancement in the
underlying musculature.

No other focal fluid collections are identified.  There is no
evidence of osteomyelitis.  A moderate sized right knee joint
effusion is partially imaged.  This is not sufficiently visualized
on the postcontrast images to assess for associated synovial
enhancement.
IMPRESSION: 1.  Small focus of pretibial cellulitis and subcutaneous fluid in
the proximal right lower leg as described.
2.  No evidence of deep abscess.
3.  No evidence of osteomyelitis.
4.  Moderate sized right knee joint effusion.

MRI OF THE LEFT LOWER LEG WITH AND WITHOUT CONTRAST
FINDINGS: On the left side, the findings are more extensive.
There is a pretibial subcutaneous edema with a larger peripherally
enhancing subcutaneous fluid collection anterior to the tibialis
anterior muscle in the proximal lower leg (similar location to that
seen on the opposite side).  This measures up to 3.1 cm transverse
and 0.6 cm in thickness.  No involvement of the adjacent
musculature is identified.

There is another fluid collection posteriorly in the distal lower
leg, wrapping around the medial aspect of the mid Achilles tendon.
This measures up to 1.5 cm in diameter.  No muscular abnormality is
identified in this region.

There is no evidence of osteomyelitis or deep abscess.  A small
left-sided knee joint effusion is present.
IMPRESSION: 1.  More extensive pretibial cellulitis on the left with
peripherally enhancing fluid collection in the subcutaneous fat of
the proximal lower leg.  This may reflect a small abscess.
2.  Small fluid collection posteriorly, wrapped around the medial
aspect of the mid Achilles tendon.
3.  No deep abscess or evidence of osteomyelitis.

## 2012-05-04 ENCOUNTER — Emergency Department (HOSPITAL_COMMUNITY)
Admission: EM | Admit: 2012-05-04 | Discharge: 2012-05-04 | Disposition: A | Discharge status: Skilled Nursing Facility | Payer: Medicaid Other | Attending: Emergency Medicine | Admitting: Emergency Medicine

## 2012-05-04 ENCOUNTER — Encounter (HOSPITAL_COMMUNITY): Payer: Self-pay | Admitting: *Deleted

## 2012-05-04 DIAGNOSIS — D649 Anemia, unspecified: Secondary | ICD-10-CM | POA: Insufficient documentation

## 2012-05-04 DIAGNOSIS — L959 Vasculitis limited to the skin, unspecified: Secondary | ICD-10-CM

## 2012-05-04 DIAGNOSIS — F17200 Nicotine dependence, unspecified, uncomplicated: Secondary | ICD-10-CM | POA: Insufficient documentation

## 2012-05-04 DIAGNOSIS — M069 Rheumatoid arthritis, unspecified: Secondary | ICD-10-CM | POA: Insufficient documentation

## 2012-05-04 DIAGNOSIS — I776 Arteritis, unspecified: Secondary | ICD-10-CM | POA: Insufficient documentation

## 2012-05-04 DIAGNOSIS — F1410 Cocaine abuse, uncomplicated: Secondary | ICD-10-CM

## 2012-05-04 DIAGNOSIS — I10 Essential (primary) hypertension: Secondary | ICD-10-CM | POA: Insufficient documentation

## 2012-05-04 LAB — RAPID URINE DRUG SCREEN, HOSP PERFORMED
Benzodiazepines: NOT DETECTED
Opiates: NOT DETECTED

## 2012-05-04 LAB — BASIC METABOLIC PANEL
BUN: 12 mg/dL (ref 6–23)
Creatinine, Ser: 0.75 mg/dL (ref 0.50–1.10)
GFR calc Af Amer: >90 mL/min (ref >90)
GFR calc non Af Amer: >90 mL/min (ref >90)
Potassium: 3.6 mEq/L (ref 3.5–5.1)

## 2012-05-04 LAB — CBC WITH DIFFERENTIAL/PLATELET
Basophils Absolute: 0 10*3/uL (ref 0.0–0.1)
Basophils Relative: 0 % (ref 0–1)
Eosinophils Absolute: 0 10*3/uL (ref 0.0–0.7)
Hemoglobin: 12.3 g/dL (ref 12.0–15.0)
MCH: 30.4 pg (ref 26.0–34.0)
MCHC: 34.7 g/dL (ref 30.0–36.0)
Monocytes Relative: 2 % — ABNORMAL LOW (ref 3–12)
Neutro Abs: 2 10*3/uL (ref 1.7–7.7)
Neutrophils Relative %: 57 % (ref 43–77)
Platelets: 291 10*3/uL (ref 150–400)
RDW: 13.7 % (ref 11.5–15.5)

## 2012-05-04 MED ORDER — OXYCODONE-ACETAMINOPHEN 5-325 MG PO TABS
1.0000 | ORAL_TABLET | Freq: Once | ORAL | Status: AC
  Administered 2012-05-04: 1 via ORAL
  Filled 2012-05-04: qty 1

## 2012-05-04 MED ORDER — PREDNISONE 50 MG PO TABS: 50.0000 mg | ORAL_TABLET | Freq: Every day | ORAL | Status: AC

## 2012-05-04 MED ORDER — MORPHINE SULFATE 4 MG/ML IJ SOLN
4.0000 mg | Freq: Once | INTRAMUSCULAR | Status: AC
  Administered 2012-05-04: 4 mg via INTRAVENOUS
  Filled 2012-05-04: qty 1

## 2012-05-04 MED ORDER — MUPIROCIN 2 % EX OINT: TOPICAL_OINTMENT | Freq: Three times a day (TID) | CUTANEOUS | Status: AC

## 2012-05-04 MED ORDER — SODIUM CHLORIDE 0.9 % IV SOLN
INTRAVENOUS | Status: DC
  Administered 2012-05-04: 07:00:00 via INTRAVENOUS

## 2012-05-04 MED ORDER — FENTANYL CITRATE 0.05 MG/ML IJ SOLN
50.0000 ug | Freq: Once | INTRAMUSCULAR | Status: AC
  Administered 2012-05-04: 50 ug via INTRAVENOUS
  Filled 2012-05-04: qty 2
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
## 2012-05-04 NOTE — ED Notes (Signed)
Pt in room tearful d/t pain to L ear, states pain 10/10, 73m morphine given. Pt's L ear blacken color on middle of lobe and swollen, open area to middle of ear lobe. Pt states has been this way since Saturday but just started having severe pain early this morning. Pt denies scratching ear or having previous sore to ear, states "it just started turning black".

## 2012-05-04 NOTE — ED Notes (Signed)
[facility] called to come get pt and take back to [facility], pt has discharge instructions and prescriptions.

## 2012-05-04 NOTE — ED Notes (Signed)
HUD:[id number]<WY> Expected date:05/04/12<BR> Expected time: 6:01 AM<BR> Means of arrival:Ambulance<BR> Comments:<BR> Ear pain

## 2012-05-04 NOTE — ED Notes (Signed)
Per EMS:  Pt from [facility], complaining of L ear pain.  EMS st's ear is swollen and oozing, and parts are necrotic.  Apparently [facility] didn't let her come here until today when pt requested to come days ago.  Pt also has some sort of necrotic infection in her lower legs.  Pt complaining of sinus pressure and has had a fever for the last 2 days.

## 2012-05-19 ENCOUNTER — Encounter: Payer: Medicaid Other | Admitting: Internal Medicine

## 2012-05-20 ENCOUNTER — Encounter: Payer: Self-pay | Admitting: Internal Medicine

## 2012-05-20 ENCOUNTER — Encounter: Payer: Medicaid Other | Admitting: Internal Medicine

## 2012-05-20 ENCOUNTER — Ambulatory Visit (INDEPENDENT_AMBULATORY_CARE_PROVIDER_SITE_OTHER): Payer: Medicaid Other | Admitting: Internal Medicine

## 2012-05-20 VITALS — BP 126/82 | HR 86 | Temp 98.1°F | Ht 62.0 in | Wt 98.7 lb

## 2012-05-20 DIAGNOSIS — I776 Arteritis, unspecified: Secondary | ICD-10-CM

## 2012-05-20 DIAGNOSIS — F1910 Other psychoactive substance abuse, uncomplicated: Secondary | ICD-10-CM

## 2012-05-20 DIAGNOSIS — R636 Underweight: Secondary | ICD-10-CM

## 2012-05-20 MED ORDER — PREDNISONE 20 MG PO TABS: 20.0000 mg | ORAL_TABLET | Freq: Every day | ORAL | Status: AC

## 2012-05-20 NOTE — Assessment & Plan Note (Signed)
BMI is 18.3  - will allow double portion for her meals at her [facility].

## 2012-05-20 NOTE — Assessment & Plan Note (Signed)
Patient presents with Bilateral ear lesions, redness and ? necrotic changes. She is noted to have positive UDS for Cocaine on 05/04/12 at ED when she had her ears lesion evaluated. Admits her last cocaine use was 2 days ago.    - prednisone 20 mg po daily x 10 days - patient is instructed to stop Cocaine abuse - [facility] will follow.

## 2012-05-20 NOTE — Progress Notes (Signed)
Subjective:   [name]   DOB: [birth year] 51 y.o.   MRN: [id number]  HPI: [name] is a 51 y.o. woman wit hPMH of cocaine/marijunana abuse, levimasole induced vasculitis and HTN, who presents to the clinic for bilateral ear pain.  Patient has had recurrent bilateral cutaneous vasculitis usually presented on her ears and limbs 2/2 Levimasole toxicity related to cocaine abuse. She was instructed to stop coacine repeatedly in the past. She presented to ED in the end of July with similar ear lesions and was told that she has had recurrent vasculitis. Her drug screen was positive for cocaine at that time. She was instructed to stop cocaine abuse. However, she continues to abuse cocaine and her last usage was 2 days ago. She is here for B/L ear lesions.    Past Medical History  Diagnosis Date  . 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 Followed by Dr. Louanne Skye  . Hypertension   . Cocaine abuse     ongoing with resultant vaculitis.  . Tobacco abuse   . Depression   . Normocytic anemia     BL Hgb 9.8-12. Last anemia panel 04/2010 - showing Fe 19, ferritin 101.  Pt on monthly B12 injections  . Rheumatoid arthritis     patient reported   Current Outpatient Prescriptions  Medication Sig Dispense Refill  . amLODipine (NORVASC) 2.5 MG tablet Take 1 tablet (2.5 mg total) by mouth daily.  30 tablet  5  . aspirin EC 81 MG EC tablet Take 1 tablet (81 mg total) by mouth daily.  30 tablet  3  . diphenhydrAMINE (BENADRYL) 25 mg capsule Take 25 mg by mouth every 6 (six) hours as needed. itching       . FLUoxetine (PROZAC) 20 MG capsule Take 40 mg by mouth daily.      Marland Kitchen ketorolac (TORADOL) 10 MG tablet Take 10 mg by mouth every 6 (six) hours as needed. For pain      . lisinopril (PRINIVIL,ZESTRIL) 20 MG tablet Take 20 mg by mouth daily. For blood pressure       . Multiple Vitamin (MULTIVITAMIN WITH MINERALS) TABS Take 1 tablet by mouth daily.      . predniSONE (DELTASONE) 20 MG  tablet Take 1 tablet (20 mg total) by mouth daily.  10 tablet  0  . traMADol (ULTRAM) 50 MG tablet Take 1 tablet (50 mg total) by mouth every 6 (six) hours as needed. For pain  30 tablet  0  . vitamin B-12 (CYANOCOBALAMIN) 1000 MCG tablet Take 1,000 mcg by mouth daily.        Marland Kitchen zolpidem (AMBIEN) 10 MG tablet Take 1 tablet (10 mg total) by mouth at bedtime.  30 tablet  0   Family History  Problem Relation Age of Onset  . Breast cancer Mother     Breast cancer  . Alcohol abuse Mother   . Colon cancer Maternal Aunt 11  . Alcohol abuse Father    History   Social History  . Marital Status: Single    Spouse Name: N/A    Number of Children: 0  . Years of Education: 11th grade   Occupational History  . Disability     since 2011, due to her rheumatoid arthritis   Social History Main Topics  . Smoking status: Current Everyday Smoker -- 0.2 packs/day for 39 years    Types: Cigarettes  . Smokeless tobacco: Never Used  . Alcohol Use: No  .  Drug Use: Yes    Special: "Crack" cocaine, Marijuana, Cocaine     smokes cocaine  . Sexually Active: No   Other Topics Concern  . 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   HEENT:  Denies congestion, sore throat, rhinorrhea, sneezing, mouth sores, trouble swallowing, neck pain. B/L ear redness and ? Necrotic lesion noted.   Respiratory:  Denies SOB, DOE, cough, and wheezing.   Cardiovascular:  Denies palpitations and leg swelling.   Gastrointestinal:  Denies nausea, vomiting, abdominal pain, diarrhea, constipation, blood in stool and abdominal distention.   Genitourinary:  Denies dysuria, urgency, frequency, hematuria, flank pain and difficulty urinating.   Musculoskeletal:  Denies  myalgias, back pain, joint swelling, arthralgias and gait problem.   Skin:  Denies pallor, rash and wound. Scattered lesions on her limbs with various healing stages.  Neurological:  Denies dizziness, seizures, syncope, weakness, light-headedness, numbness and headaches.    .   Objective:  Physical Exam: Filed Vitals:   05/20/12 1326 05/20/12 1417  BP: 126/82   Pulse: 86   Temp: 98.1 F (36.7 C)   TempSrc: Oral   Height:  5' 2"  (1.575 m)  Weight: 101 lb (45.813 kg) 98 lb 11.2 oz (44.77 kg)   General: alert, well-developed, and cooperative to examination.  Head: normocephalic and atraumatic.  Eyes: vision grossly intact, pupils equal, pupils round, pupils reactive to light, no injection and anicteric. Ears:  Scared, atrophic external ear on right; B/L ears with erythema and edema, small area of dark discoloration on helix, tissue friable tender to touch. Mouth: pharynx pink and moist, no erythema, and no exudates.  Neck: supple, full ROM, no thyromegaly, no JVD, and no carotid bruits.  Lungs: normal respiratory effort, no accessory muscle use, normal breath sounds, no crackles, and no wheezes. Heart: normal rate, regular rhythm, no murmur, no gallop, and no rub.  Abdomen: soft, non-tender, normal bowel sounds, no distention, no guarding, no rebound tenderness, no hepatomegaly, and no splenomegaly.  Msk: no joint swelling, no joint warmth, and no redness over joints.  Pulses: 2+ DP/PT pulses bilaterally Extremities: No cyanosis, clubbing, edema Neurologic: alert & oriented X3, cranial nerves II-XII intact, strength normal in all extremities, sensation intact to light touch, and gait normal.  Skin: turgor normal and no rashes. Multiple areas of various stages of healing superficial scars on extremities   Psych: Oriented X3, memory intact for recent and remote, normally interactive, good eye contact, not anxious appearing, and not depressed appearing.   Assessment & Plan:    '

## 2012-05-20 NOTE — Assessment & Plan Note (Signed)
Continues to abuse cocaine and marijuana Recurrent cutaneous vasculitis  - importance of cocaine abuse discussed with patient and she understands it. - will ask SW to follow up

## 2012-05-20 NOTE — Patient Instructions (Addendum)
1. Follow up in 3 months 2. Social worker will contact you  3. Stop cocaine

## 2012-05-21 LAB — PRESCRIPTION ABUSE MONITORING 15P, URINE
Amphetamine/Meth: NEGATIVE ng/mL
Barbiturate Screen, Urine: NEGATIVE ng/mL
Creatinine, Urine: 71.25 mg/dL (ref >20.0)
Fentanyl, Ur: NEGATIVE ng/mL
Meperidine, Ur: NEGATIVE ng/mL
Methadone Screen, Urine: NEGATIVE ng/mL

## 2012-05-22 IMAGING — CR DG CHEST 2V
2 series · 2 of 2 positions shown · non-contrast
Comparison: 04/14/2010

CLINICAL DATA: Chest pain

CHEST - 2 VIEW

[w chest pa]
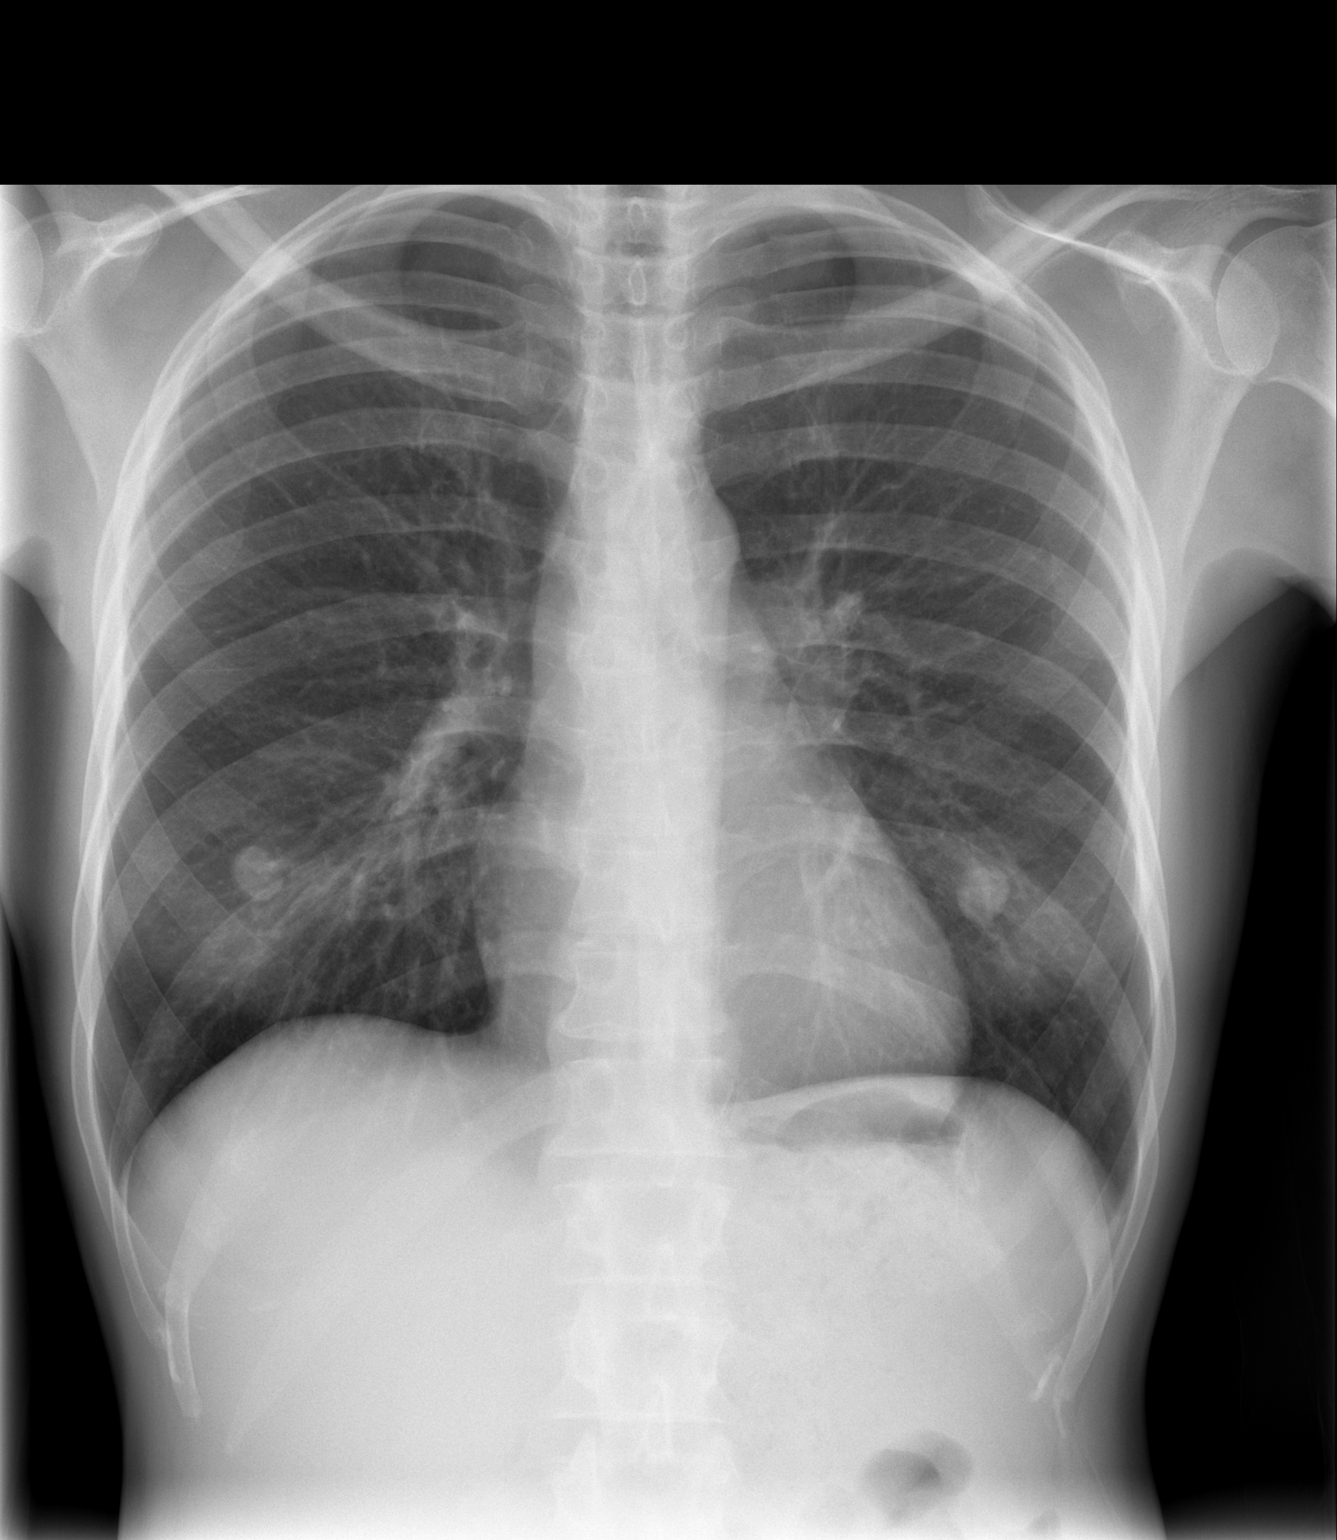

[w chest lat]
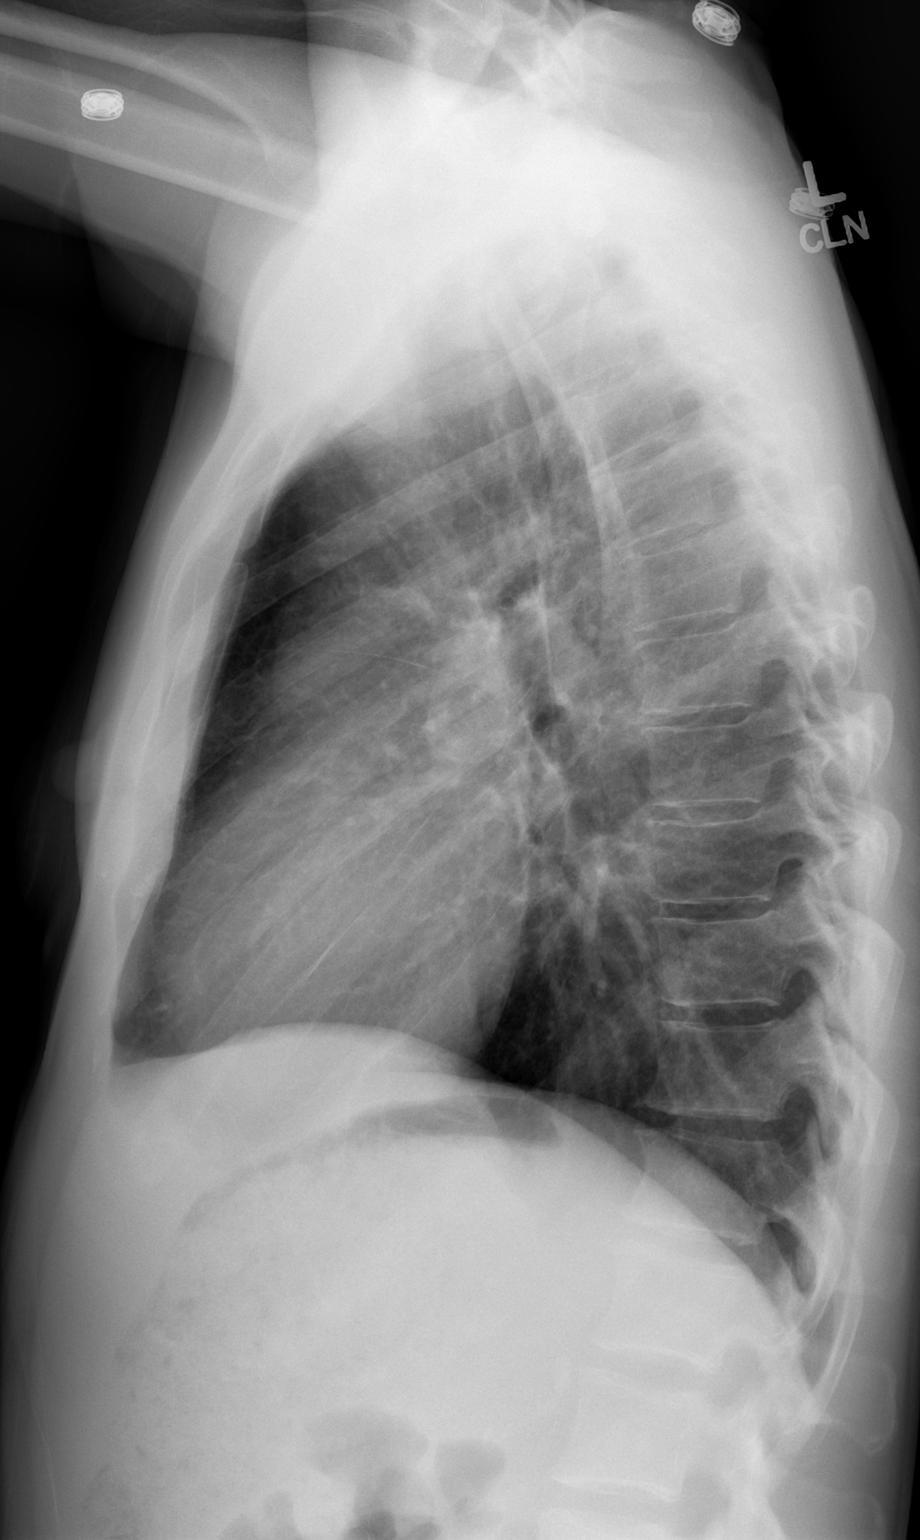

[2 of 2 positions shown; findings below may reference images not displayed]

FINDINGS: The heart size and mediastinal contours are within normal
limits.  Both lungs are clear.  The visualized skeletal structures
are unremarkable.
IMPRESSION: No acute findings.

## 2012-05-23 ENCOUNTER — Telehealth: Payer: Self-pay | Admitting: Licensed Clinical Social Worker

## 2012-05-23 NOTE — Telephone Encounter (Signed)
[facility] has deferred pt as of 05/05/2012.

## 2012-05-23 NOTE — Telephone Encounter (Signed)
Ms. Tetzlaff was referred to CSW for assistance with substance abuse referrals.  CSW placed call to [facility], staff states pt is at MD appt at this time.  CSW will follow up and inquire if pt is ready for treatment and refer as appropriate.

## 2012-05-24 NOTE — Telephone Encounter (Signed)
CSW placed call to Ms. Broce.  Pt states "I'm half sleep right now, can I call you tomorrow".  CSW provided pt with contact name and phone number.

## 2012-05-25 NOTE — Telephone Encounter (Signed)
CSW spoke with [name] at [facility].  Pt is currently living in assisted living facility.  CSW discussed PCP's concern of pt's current cocaine usage.  Pt agrees that she is in need of treatment.  CSW informed [name] she will need to follow up with treatment programs as CSW can not do this for pt.  CSW will send letter to [name] of agencies that accept her insurance.

## 2012-05-27 ENCOUNTER — Other Ambulatory Visit: Payer: Self-pay | Admitting: Internal Medicine

## 2012-05-30 ENCOUNTER — Telehealth: Payer: Self-pay | Admitting: *Deleted

## 2012-05-30 NOTE — Telephone Encounter (Signed)
I d/C'd Toradol. I will not start on Meloxicam.

## 2012-05-30 NOTE — Telephone Encounter (Signed)
Pharmacy informed.

## 2012-05-30 NOTE — Telephone Encounter (Signed)
Pharmacy called and asked for toradol to be d/c and Meloxicam to be started.  I see the d/c on Toradol but no new Rx for meloxicam. Please advise # [phone]

## 2012-06-07 IMAGING — CR DG CHEST 2V
2 series · 2 of 2 positions shown · non-contrast
Comparison: 06/08/2010

CLINICAL DATA: Fever and shortness of breath.  Chest pain.

CHEST - 2 VIEW

[w chest pa]
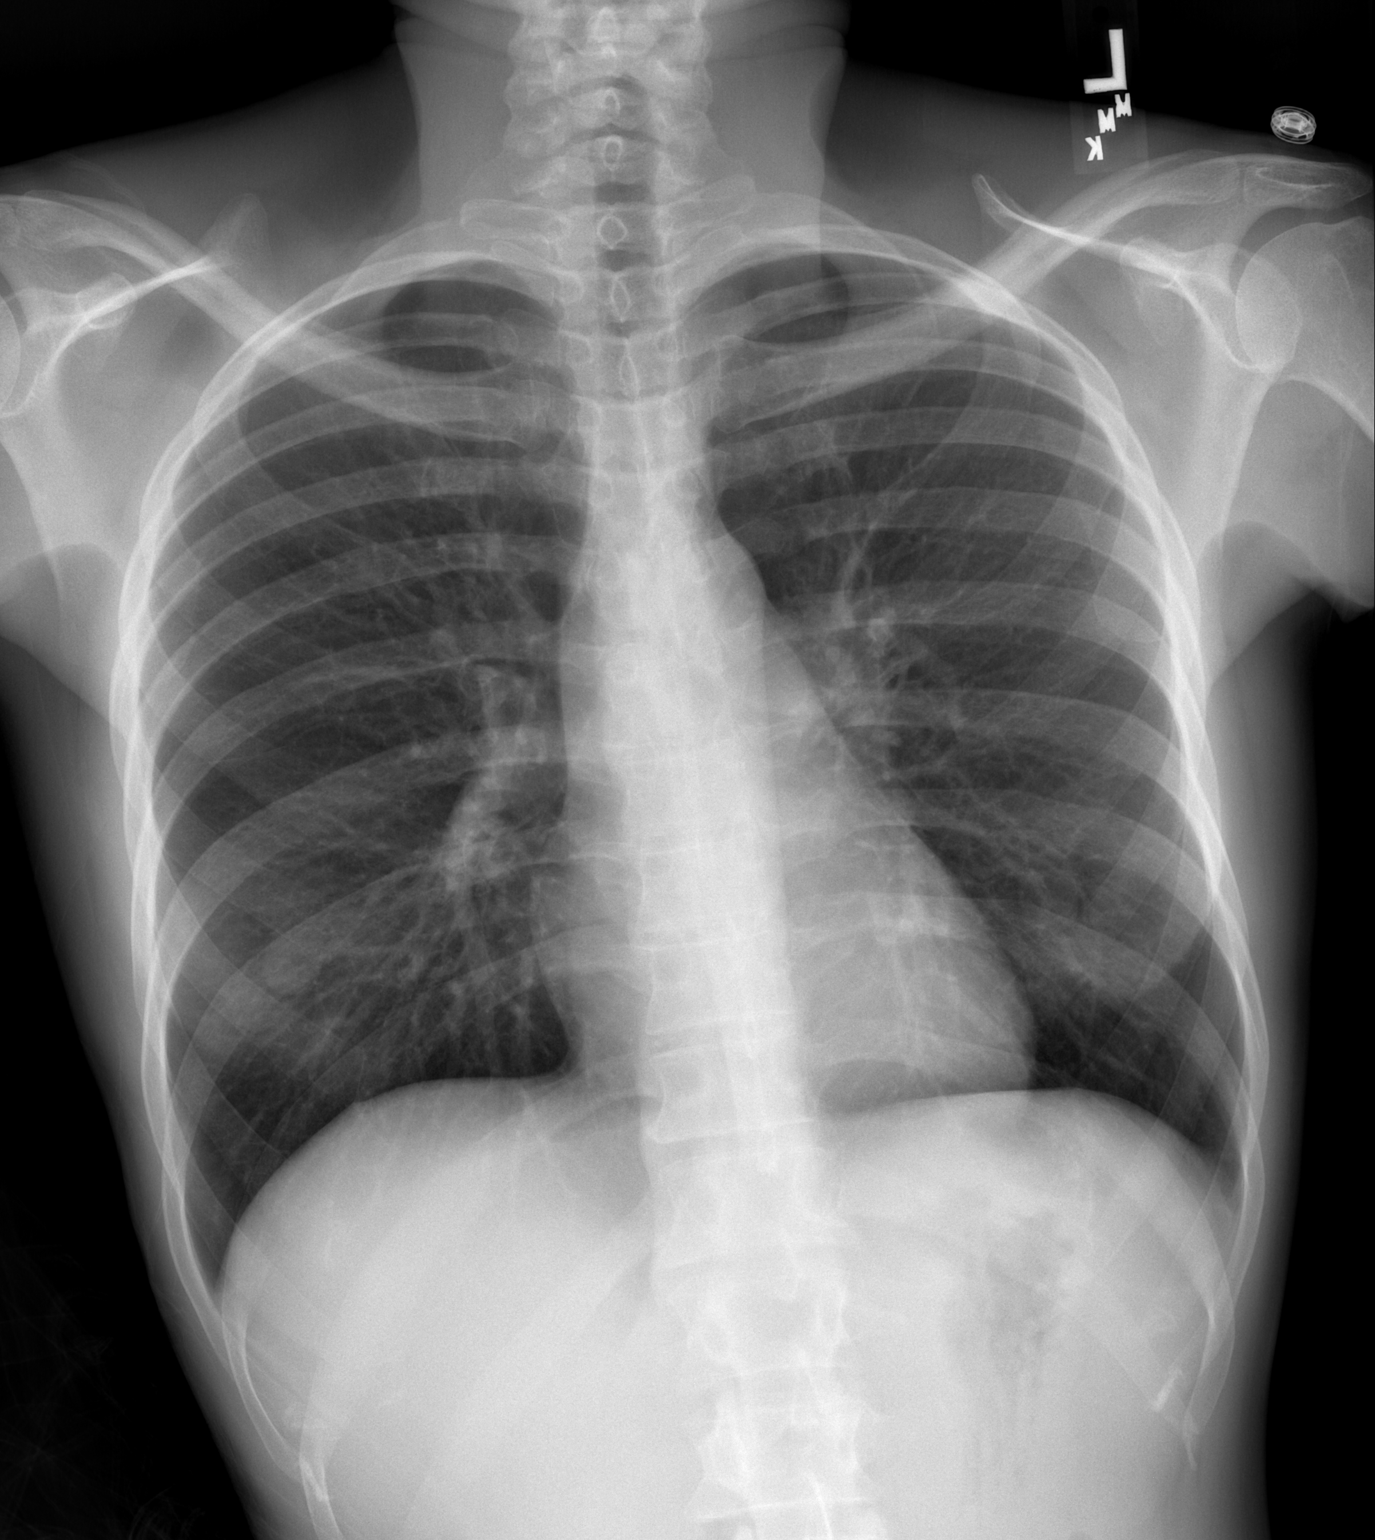

[w chest lat]
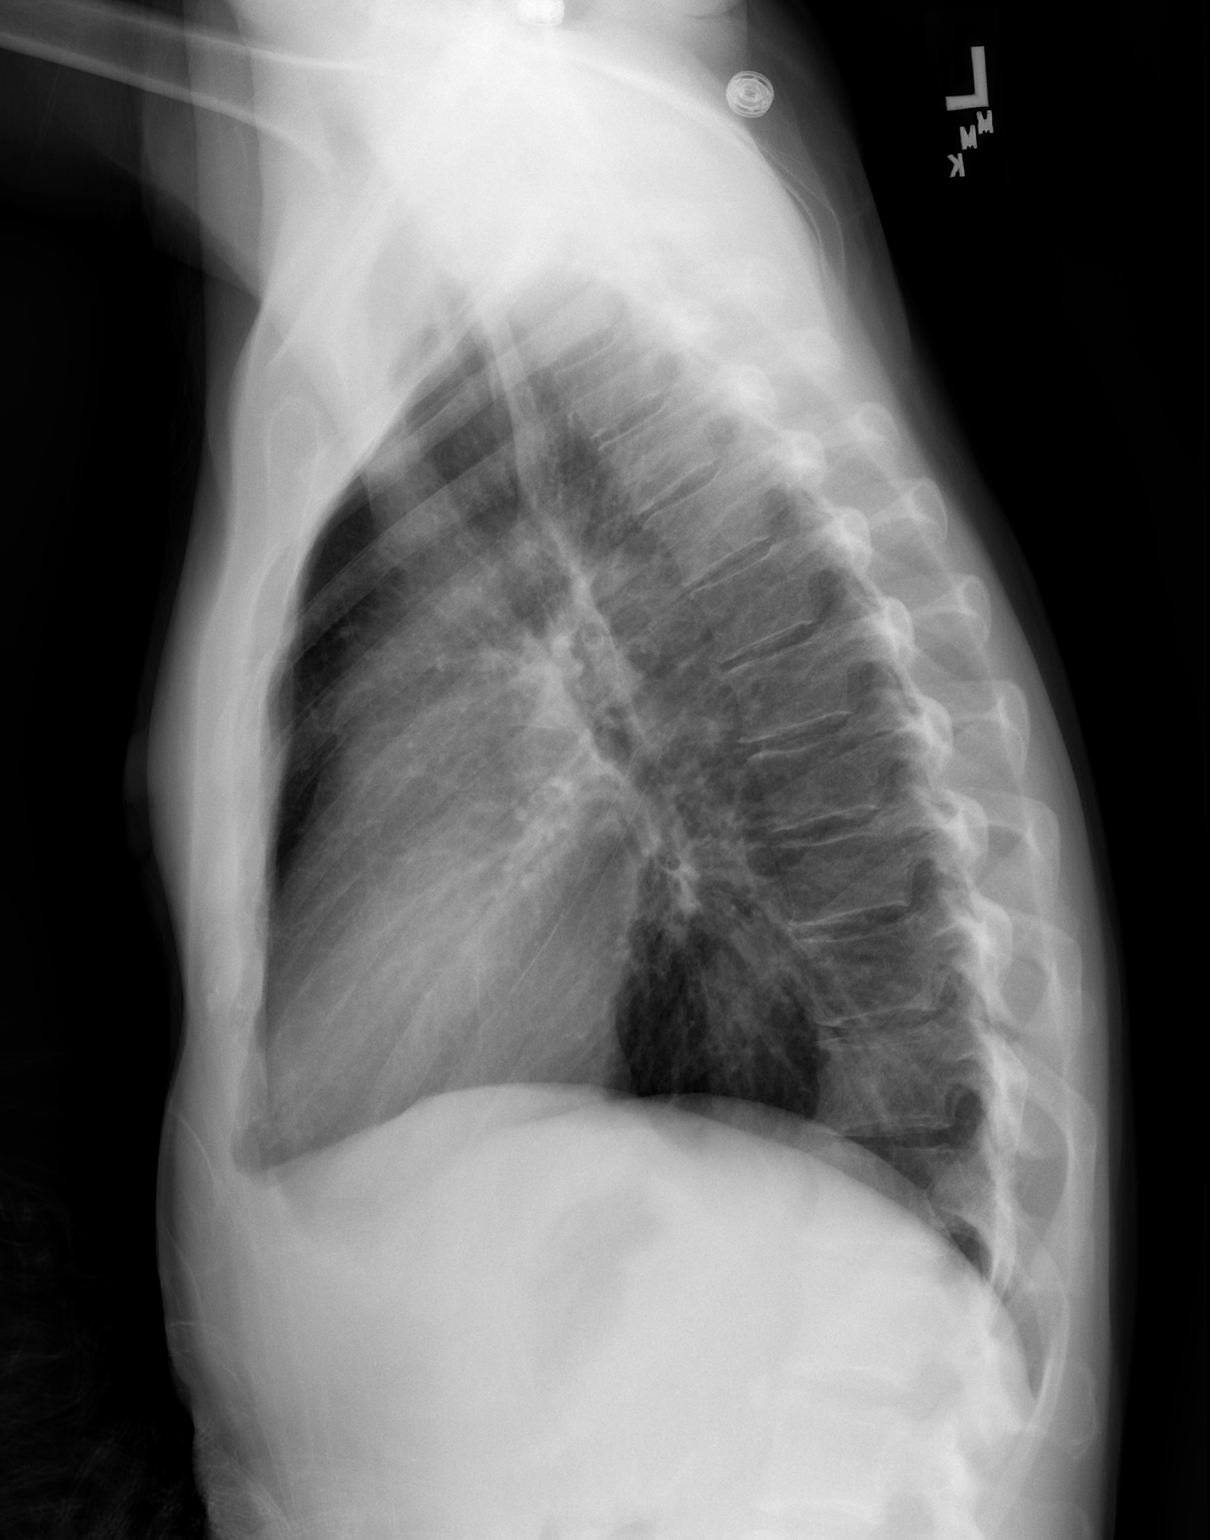

[2 of 2 positions shown; findings below may reference images not displayed]

FINDINGS: The cardiomediastinal silhouette is unremarkable.
The lungs are clear.
There is no evidence of focal airspace disease, pulmonary edema,
pleural effusion, or pneumothorax.
No acute bony abnormalities are identified.
IMPRESSION: No evidence of acute cardiopulmonary disease.

## 2012-06-08 IMAGING — CR DG CHEST 1V PORT
1 series · 1 of 1 positions shown · non-contrast
Comparison: 06/24/2010

CLINICAL DATA: Fever.

PORTABLE CHEST - 1 VIEW

[view not recorded]
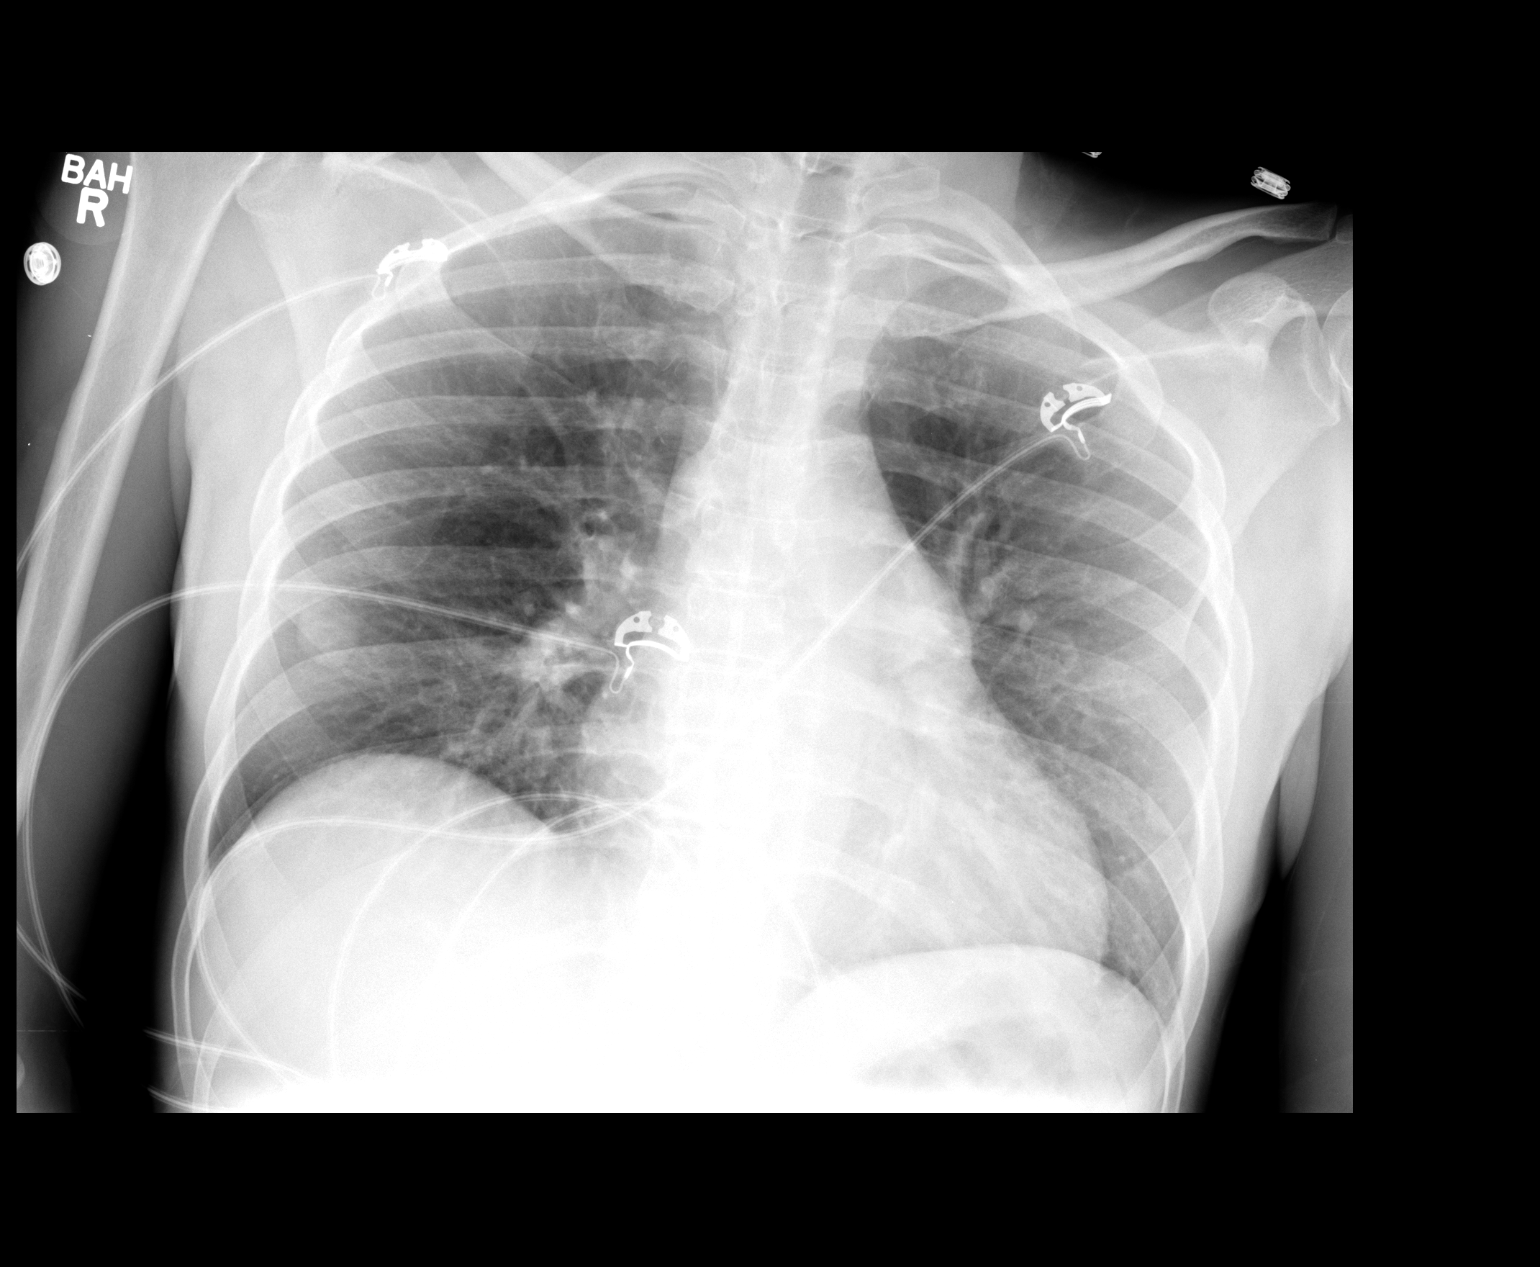

[1 of 1 positions shown; findings below may reference images not displayed]

FINDINGS: The heart size and vascularity are normal and the lungs
are clear.  No osseous abnormality.
IMPRESSION: Normal chest.

## 2012-06-26 ENCOUNTER — Encounter (HOSPITAL_COMMUNITY): Payer: Self-pay | Admitting: Emergency Medicine

## 2012-06-26 ENCOUNTER — Inpatient Hospital Stay (HOSPITAL_COMMUNITY)
Admission: EM | Admit: 2012-06-26 | Discharge: 2012-06-26 | DRG: 547 | Disposition: A | Discharge status: Other Acute Inpatient Hospital | Payer: Medicaid Other | Attending: Emergency Medicine | Admitting: Emergency Medicine

## 2012-06-26 DIAGNOSIS — I776 Arteritis, unspecified: Principal | ICD-10-CM | POA: Diagnosis present

## 2012-06-26 DIAGNOSIS — F17200 Nicotine dependence, unspecified, uncomplicated: Secondary | ICD-10-CM | POA: Diagnosis present

## 2012-06-26 DIAGNOSIS — Z79899 Other long term (current) drug therapy: Secondary | ICD-10-CM

## 2012-06-26 DIAGNOSIS — I10 Essential (primary) hypertension: Secondary | ICD-10-CM | POA: Diagnosis present

## 2012-06-26 DIAGNOSIS — R079 Chest pain, unspecified: Secondary | ICD-10-CM | POA: Diagnosis present

## 2012-06-26 DIAGNOSIS — L03114 Cellulitis of left upper limb: Secondary | ICD-10-CM | POA: Diagnosis present

## 2012-06-26 DIAGNOSIS — F1910 Other psychoactive substance abuse, uncomplicated: Secondary | ICD-10-CM

## 2012-06-26 DIAGNOSIS — M069 Rheumatoid arthritis, unspecified: Secondary | ICD-10-CM | POA: Diagnosis present

## 2012-06-26 DIAGNOSIS — Z7982 Long term (current) use of aspirin: Secondary | ICD-10-CM

## 2012-06-26 DIAGNOSIS — F1410 Cocaine abuse, uncomplicated: Secondary | ICD-10-CM | POA: Diagnosis present

## 2012-06-26 DIAGNOSIS — F329 Major depressive disorder, single episode, unspecified: Secondary | ICD-10-CM | POA: Diagnosis present

## 2012-06-26 DIAGNOSIS — F3289 Other specified depressive episodes: Secondary | ICD-10-CM | POA: Diagnosis present

## 2012-06-26 DIAGNOSIS — IMO0002 Reserved for concepts with insufficient information to code with codable children: Secondary | ICD-10-CM

## 2012-06-26 LAB — CBC WITH DIFFERENTIAL/PLATELET
Eosinophils Absolute: 0 10*3/uL (ref 0.0–0.7)
Eosinophils Relative: 1 % (ref 0–5)
HCT: 37 % (ref 36.0–46.0)
Hemoglobin: 12.8 g/dL (ref 12.0–15.0)
Lymphs Abs: 1.3 10*3/uL (ref 0.7–4.0)
MCH: 30.3 pg (ref 26.0–34.0)
MCV: 87.7 fL (ref 78.0–100.0)
Monocytes Absolute: 0.1 10*3/uL (ref 0.1–1.0)
Monocytes Relative: 4 % (ref 3–12)
RBC: 4.22 MIL/uL (ref 3.87–5.11)

## 2012-06-26 LAB — RAPID URINE DRUG SCREEN, HOSP PERFORMED
Amphetamines: NOT DETECTED
Barbiturates: NOT DETECTED
Benzodiazepines: NOT DETECTED

## 2012-06-26 MED ORDER — MORPHINE SULFATE 4 MG/ML IJ SOLN
4.0000 mg | Freq: Once | INTRAMUSCULAR | Status: AC
  Administered 2012-06-26: 4 mg via INTRAVENOUS

## 2012-06-26 MED ORDER — MORPHINE SULFATE 4 MG/ML IJ SOLN
4.0000 mg | Freq: Once | INTRAMUSCULAR | Status: DC
  Filled 2012-06-26: qty 1

## 2012-06-26 MED ORDER — ZOLPIDEM TARTRATE 10 MG PO TABS: 10.0000 mg | ORAL_TABLET | Freq: Every day | ORAL | Status: DC

## 2012-06-26 MED ORDER — PREDNISONE 20 MG PO TABS: ORAL_TABLET | ORAL | Status: DC

## 2012-06-26 MED ORDER — ONDANSETRON HCL 4 MG/2ML IJ SOLN
4.0000 mg | Freq: Once | INTRAMUSCULAR | Status: AC
  Administered 2012-06-26: 4 mg via INTRAVENOUS
  Filled 2012-06-26: qty 2

## 2012-06-26 MED ORDER — METHYLPREDNISOLONE SODIUM SUCC 125 MG IJ SOLR
125.0000 mg | INTRAMUSCULAR | Status: AC
  Administered 2012-06-26: 125 mg via INTRAVENOUS
  Filled 2012-06-26: qty 2

## 2012-06-26 MED ORDER — SODIUM CHLORIDE 0.9 % IV BOLUS (SEPSIS)
1000.0000 mL | Freq: Once | INTRAVENOUS | Status: AC
  Administered 2012-06-26: 1000 mL via INTRAVENOUS

## 2012-06-26 MED ORDER — HYDROCODONE-IBUPROFEN 5-200 MG PO TABS: 1.0000 | ORAL_TABLET | Freq: Three times a day (TID) | ORAL | Status: DC | PRN

## 2012-06-26 MED ORDER — DOXYCYCLINE HYCLATE 100 MG PO TABS: 100.0000 mg | ORAL_TABLET | Freq: Two times a day (BID) | ORAL | Status: DC

## 2012-06-26 NOTE — ED Notes (Signed)
Pt for discharge.BP 104/64,RR 16,Hr 73/mnt,SPO2 100.GCS 15.Pt picked up by [facility] to the nursing care facility.

## 2012-06-26 NOTE — ED Notes (Signed)
IV removed.

## 2012-06-26 NOTE — ED Notes (Signed)
Pt ambulatory - meal tray ordered.

## 2012-06-26 NOTE — Consult Note (Signed)
Hospital Admission Note Date: 06/26/2012  Patient name: [name] Medical record number: [id number] Date of birth: 06-22-1961 Age: 51 y.o. Gender: female PCP: LI, NA, MD  Medical Service: Internal Medicine  Attending physician:  Gilles Chiquito, MD     1st Contact: Dr Jessee Avers  Pager: [phone]  2nd Contact: Dr Charlann Lange   Pager: [phone]     After 5 pm or weekends:  1st Contact: Pager: [phone]  2nd Contact: Pager: [phone]   Chief Complaint: Skin rash and ulceration on arms and ears for 4 days and chest pain  History of Present Illness: [name] is a 51 year old woman, with extensive history of cocaine abuse, Levimazole-induced vasculitis, hypertension, and depression, who presents to the ED, with a 4 days history of lesions on her arms, and ears. She reports that she was well until 4 days ago, when she developed his lesions, which have been painful, especially the ones on the ears, compromising her sleep, and prompting her to come to the ED. She also complains of a similar lesion on her buttock. She has been admitted to the hospital for similar lesions, which have been thought to be resulting from Levimazole-induced vasculitis. She also reports history of chest pain that lasted about 3 minutes at around 7 AM this morning. She denies history of shortness of breath or palpitations accompanying this chest pain. She denies history of fever or chills, no loss of appetite, or increased fatigue. She denies history of vomiting, or nausea. She denies any history of cough.  [name] has been counseled repeatedly on sedation of cocaine abuse and cigarette smoking, but she has not succeeded in quitting these substances. She follows up with Dr. Nicoletta Dress in the outpatient clinic. Over the last 2 years, she has been a resident of a nursing facility in [place].  Meds: Current Outpatient Rx  Name Route Sig Dispense Refill  . AMLODIPINE BESYLATE 2.5 MG PO TABS Oral Take 1 tablet (2.5 mg total) by mouth  daily. 30 tablet 5  . ASPIRIN 81 MG PO TBEC Oral Take 1 tablet (81 mg total) by mouth daily. 30 tablet 3  . DIPHENHYDRAMINE HCL 25 MG PO CAPS Oral Take 25 mg by mouth every 6 (six) hours as needed. itching     . FLUOXETINE HCL 20 MG PO CAPS Oral Take 40 mg by mouth daily.    Marland Kitchen LISINOPRIL 20 MG PO TABS Oral Take 20 mg by mouth daily. For blood pressure     . ADULT MULTIVITAMIN W/MINERALS CH Oral Take 1 tablet by mouth daily.    Marland Kitchen MUPIROCIN 2 % EX OINT Topical Apply topically 3 (three) times daily.    . TRAMADOL HCL 50 MG PO TABS Oral Take 1 tablet (50 mg total) by mouth every 6 (six) hours as needed. For pain 30 tablet 0  . VITAMIN B-12 1000 MCG PO TABS Oral Take 1,000 mcg by mouth daily.      Marland Kitchen ZOLPIDEM TARTRATE 10 MG PO TABS Oral Take 1 tablet (10 mg total) by mouth at bedtime. 30 tablet 0  . DOXYCYCLINE HYCLATE 100 MG PO TABS Oral Take 1 tablet (100 mg total) by mouth 2 (two) times daily. 10 tablet 0  . HYDROCODONE-IBUPROFEN 5-200 MG PO TABS Oral Take 1 tablet by mouth every 8 (eight) hours as needed for pain. 10 tablet 0  . PREDNISONE 20 MG PO TABS  3 Tabs PO Days 1-3, then 2 tabs PO Days 4-6, then 1 tab PO Day 7-9,  then Half Tab PO Day 10-12 20 tablet 0    Allergies: Allergies as of 06/26/2012 - Review Complete 06/26/2012  Allergen Reaction Noted  . Acetaminophen     Past Medical History  Diagnosis Date  . 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 Followed by Dr. Louanne Skye  . Hypertension   . Cocaine abuse     ongoing with resultant vaculitis.  . Tobacco abuse   . Depression   . Normocytic anemia     BL Hgb 9.8-12. Last anemia panel 04/2010 - showing Fe 19, ferritin 101.  Pt on monthly B12 injections  . Rheumatoid arthritis     patient reported   Past Surgical History  Procedure Date  . Skin biopsy     bilateral shin nodules on 04/2010  . Hernia repair    Family History  Problem Relation Age of Onset  . Breast cancer Mother     Breast cancer  . Alcohol abuse Mother   . Colon  cancer Maternal Aunt 56  . Alcohol abuse Father    History   Social History  . Marital Status: Single    Spouse Name: N/A    Number of Children: 0  . Years of Education: 11th grade   Occupational History  . Disability     since 2011, due to her rheumatoid arthritis   Social History Main Topics  . Smoking status: Current Every Day Smoker -- 0.2 packs/day for 39 years    Types: Cigarettes  . Smokeless tobacco: Never Used  . Alcohol Use: No  . Drug Use: Yes    Special: "Crack" cocaine, Marijuana, Cocaine     smokes cocaine  . Sexually Active: No   Other Topics Concern  . 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 It resolved without any treatment. Gastrointestinal: negative for abdominal pain, constipation and vomiting Genitourinary:negative for dysuria, frequency, hematuria and urinary incontinence Musculoskeletal:negative for arthralgias, back pain and However, she reports muscle pains that are generalised. Neurological: negative for dizziness, gait problems, headaches, memory problems, speech problems, tremors, vertigo and weakness Behavioral/Psych: negative  Physical Exam: Blood pressure 108/60, pulse 66, resp. rate 20, SpO2 100.00%. General appearance: alert, cooperative, cachectic, mild distress and she is teaful and asks for ambien and prednisone Head: Normocephalic, without obvious abnormality, atraumatic Eyes: negative,  conjunctivae/corneas clear. PERRL, EOM's intact. Fundi benign. Ears: The pineal both ears shows some wounds, which are fresh, they are not actively bleeding, but they have necrotic areas surrounding them. They're mainly on the outer edges of the pinnae. They are not septic.  Neck: no adenopathy, no carotid bruit, no JVD, supple, symmetrical, trachea midline and thyroid not enlarged, symmetric, no tenderness/mass/nodules Back: negative, no kyphosis present, She has an area of similar wound on her buttock. It is about 1cm in diameter. Superficial and without area of necrosis.  Lungs: clear to auscultation bilaterally Heart: regular rate and rhythm, S1, S2 normal, no murmur, click, rub or gallop Abdomen: soft, non-tender; bowel sounds normal; no masses,  no organomegaly  Extremities: extremities normal, atraumatic, no cyanosis or edema and There are hive like lesion on the upper arms which are hyperemic and tender without septic appearance.  Pulses: 2+ and symmetric Skin: Skin color, texture, turgor normal. No rashes or lesions or dermatitis rash around the cheeks. They are less severe compared to the one on the upper arms and ears.. The one on the left arm is bigger than the right. Neurologic: Alert and oriented X 3, normal strength and tone. Normal symmetric reflexes. Normal coordination and gait  Lab results: Basic Metabolic Panel: No results found for this basename: NA:2,K:2,CL:2,CO2:2,GLUCOSE:2,BUN:2,CREATININE:2,CALCIUM:2,MG:2,PHOS:2 in the last 72 hours Liver Function Tests: No results found for this basename: AST:2,ALT:2,ALKPHOS:2,BILITOT:2,PROT:2,ALBUMIN:2 in the last 72 hours No results found for this basename: LIPASE:2,AMYLASE:2 in the last 72 hours No results found for this basename: AMMONIA:2 in the last 72 hours CBC:  Basename 06/26/12 1503  WBC 3.3*  NEUTROABS 1.8  HGB 12.8  HCT 37.0  MCV 87.7  PLT 292   Cardiac Enzymes: No results found for this basename:  CKTOTAL:3,CKMB:3,CKMBINDEX:3,TROPONINI:3 in the last 72 hours BNP: No results found for this basename: PROBNP:3 in the last 72 hours D-Dimer: No results found for this basename: DDIMER:2 in the last 72 hours CBG: No results found for this basename: GLUCAP:6 in the last 72 hours Hemoglobin A1C: No results found for this basename: HGBA1C in the last 72 hours Fasting Lipid Panel: No results found for this basename: CHOL,HDL,LDLCALC,TRIG,CHOLHDL,LDLDIRECT in the last 72 hours Thyroid Function Tests: No results found for this basename: TSH,T4TOTAL,FREET4,T3FREE,THYROIDAB in the last 72 hours Anemia Panel: No results found for this basename: VITAMINB12,FOLATE,FERRITIN,TIBC,IRON,RETICCTPCT in the last 72 hours Coagulation: No results found for this basename: LABPROT:2,INR:2 in the last 72 hours Urine Drug Screen: Drugs of Abuse     Component Value Date/Time   LABOPIA NONE DETECTED 06/26/2012 1747   LABOPIA NEG 05/20/2012 1405   COCAINSCRNUR POSITIVE* 06/26/2012 1747   COCAINSCRNUR PPS 05/20/2012 1405   LABBENZ NONE DETECTED 06/26/2012 1747   LABBENZ NEG 05/20/2012 1405   LABBENZ NEGATIVE 06/12/2011 0221   AMPHETMU NONE DETECTED 06/26/2012 1747   AMPHETMU NEGATIVE 06/12/2011 0221   THCU POSITIVE* 06/26/2012 1747   THCU 63.4* 05/20/2012 1405   LABBARB NONE DETECTED 06/26/2012 1747   LABBARB NEG 05/20/2012 1405    Alcohol Level: No results found for this basename: ETH:2 in the last 72 hours Urinalysis: No results found for this basename: COLORURINE:2,APPERANCEUR:2,LABSPEC:2,PHURINE:2,GLUCOSEU:2,HGBUR:2,BILIRUBINUR:2,KETONESUR:2,PROTEINUR:2,UROBILINOGEN:2,NITRITE:2,LEUKOCYTESUR:2 in the last 72 hours Misc. Labs: Imaging results:  No results found.  Other results: EKG: normal sinus rhythm, with LVH. No acute ST segment changes.   Assessment & Plan by Problem: This is a 51 year old woman, with past medical history significant for substance abuse including cocaine, hypertension, and multiple  admissions for vasculitis related to cocaine/Levimazole. She presents to the ED with multiple skin rash and ulcerations mainly involving the ears in the upper arms. She also complains of chest pain, which lasted 3 minutes around 7 AM today.  VASCULITIS associated with crack cocaine use: She has had an extensive history and evaluation. In the ED several times for this problem, which occurs after cocaine use. It has been told that these lesions result from Levimazole-associated vasculitis which usually contaminates cocaine. She has also been followed up in the outpatient clinic and counseled extensively for cocaine abuse. However, the patient continues to use, cocaine, and getting similar lesions, resulting from this vasculitis. Usual treatment involves, prednisone, which she responds to. She is back in the ED for very similar presentation.  She does have tenderness on the right upper arm, with eryethema and tenderness without warmth to touch. She does not have any clinical signs of cellulitis-no fevers no chills, with a normal white blood cell count. Urine drug screen is positive for cocaine. The wounds do not look septic. The patient has been offered an appointment as outpatient on Monday, but she refuses eat, and request to be given an appointment on Tuesday. She explains that on Monday she will to be resting. She fully understands the risks associated with this decision, including the risk of death from cocaine abuse.  Plan -Outpatient referral for wound care. - Social worker referral for aggressive counseling for cocaine abuse. -Tapering does of prednisone for 10days - Doxycycline for 5 days due to a possibility of early cellulitis. - Vicoprofen 10 tables  Chest pain: Patient reports a history of chest pain today in the morning at around 7am, which lasted about 3 minutes. She describes the chest pain as left-sided , and she believes was from 'excess gas' in her stomach. She does not report associated  shortness of breath, palpitations. She has no history of cough. I suspect that this chest pain is associated with cocaine use even though she denies cocaine in the morning. She has been evaluated in the ED for similar pains with negative workup for cardiac cause. Nevertheless, the patient has an increased risk of myocardial ischemic given her cocaine use. However, the EKG performed in the ED, does not show any ischemic changes. Troponin x1 was negative given her h/o of chest pain today in the morning. There is evidence of left ventricular hypertrophy, probably as a result of hypertension.   Hypertension: Blood pressure in the ED, was 118/74. Patient will continue with her home medications for blood pressure. Polysubstance abuse: Patient will be followed up as outpatient for this problem, which has persisted and associated with Levimazole- vasculitis.  Disposition: She will be discharged from the ED and follow up as outpatient in 2 days.   Signed: Jessee Avers 06/26/2012, 7:20 PM

## 2012-06-26 NOTE — ED Provider Notes (Signed)
Patient developed a painful rash on bilateral ears and bilateral arms after having used crack cocaine for the past several days. She denies IV drug use.. On exam patient is cachectic chronically ill-appearing bilateral ears with open wounds   AT PINNAS  bilateral upper extremities with ecchymotic rash in streaks Rash is consistent with vasculitis Plan admission, steroids, analgesia.  Orlie Dakin, MD 06/26/12 1547

## 2012-06-26 NOTE — ED Provider Notes (Signed)
History     CSN: [id number]  Arrival date & time 06/26/12  1107   First MD Initiated Contact with Patient 06/26/12 1435      Chief Complaint  Patient presents with  . Rash    (Consider location/radiation/quality/duration/timing/severity/associated sxs/prior treatment) HPI Hx from patient. [name] is a 51 y.o. female presenting with complaint of rash. She has a history of crack cocaine use and associated vasculitis which is thought to be secondary to Levamisole toxicity (a cocaine additive). She has had lesions present to her bilateral ears previously but presents today with worsening pain secondary to new lesions on her left arm. She has not noted any new lesions elsewhere. She denies chest pain, shortness of breath, cough, fever, chills. She states that she last smoked crack 4-5 days ago. She denies any IVDU. Pain severe, increases with palpation.   She is followed by Dr. Nicoletta Dress with the IM teaching service.  Past Medical History  Diagnosis Date  . 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 Followed by Dr. Louanne Skye  . Hypertension   . Cocaine abuse     ongoing with resultant vaculitis.  . Tobacco abuse   . Depression   . Normocytic anemia     BL Hgb 9.8-12. Last anemia panel 04/2010 - showing Fe 19, ferritin 101.  Pt on monthly B12 injections  . Rheumatoid arthritis     patient reported    Past Surgical History  Procedure Date  . Skin biopsy     bilateral shin nodules on 04/2010  . Hernia repair     Family History  Problem Relation Age of Onset  . Breast cancer Mother     Breast cancer  . Alcohol abuse Mother   . Colon cancer Maternal Aunt 77  . Alcohol abuse Father     History  Substance Use Topics  . Smoking status: Current Every Day Smoker -- 0.2 packs/day for 39 years    Types: Cigarettes  . Smokeless tobacco: Never Used  . Alcohol Use: No    OB History    Grav Para Term Preterm Abortions TAB SAB Ect Mult Living                  Review of Systems    Constitutional: Negative for fever, chills and fatigue.  Respiratory: Negative for chest tightness and shortness of breath.   Cardiovascular: Negative for chest pain.  Musculoskeletal: Negative for myalgias.  Skin: Positive for rash.  Neurological: Negative for dizziness and weakness.    Allergies  Acetaminophen  Home Medications   Current Outpatient Rx  Name Route Sig Dispense Refill  . AMLODIPINE BESYLATE 2.5 MG PO TABS Oral Take 1 tablet (2.5 mg total) by mouth daily. 30 tablet 5  . ASPIRIN 81 MG PO TBEC Oral Take 1 tablet (81 mg total) by mouth daily. 30 tablet 3  . DIPHENHYDRAMINE HCL 25 MG PO CAPS Oral Take 25 mg by mouth every 6 (six) hours as needed. itching     . FLUOXETINE HCL 20 MG PO CAPS Oral Take 40 mg by mouth daily.    Marland Kitchen LISINOPRIL 20 MG PO TABS Oral Take 20 mg by mouth daily. For blood pressure     . ADULT MULTIVITAMIN W/MINERALS CH Oral Take 1 tablet by mouth daily.    Marland Kitchen MUPIROCIN 2 % EX OINT Topical Apply topically 3 (three) times daily.    . TRAMADOL HCL 50 MG PO TABS Oral Take 1 tablet (50 mg total) by  mouth every 6 (six) hours as needed. For pain 30 tablet 0  . VITAMIN B-12 1000 MCG PO TABS Oral Take 1,000 mcg by mouth daily.      Marland Kitchen ZOLPIDEM TARTRATE 10 MG PO TABS Oral Take 1 tablet (10 mg total) by mouth at bedtime. 30 tablet 0    BP 118/74  Pulse 71  SpO2 100%  Physical Exam  Nursing note and vitals reviewed. Constitutional: She appears well-developed and well-nourished. No distress.       Cachectic female who appears uncomfortable, crying  HENT:  Head: Normocephalic and atraumatic.  Mouth/Throat: Oropharynx is clear and moist. No oropharyngeal exudate.  Eyes: Pupils are equal, round, and reactive to light.  Neck: Normal range of motion.  Cardiovascular: Normal rate, regular rhythm and normal heart sounds.   Pulmonary/Chest: Effort normal and breath sounds normal. She exhibits no tenderness.  Abdominal: Soft. Bowel sounds are normal. There is no  tenderness. There is no rebound and no guarding.  Musculoskeletal: Normal range of motion.  Neurological: She is alert.  Skin: Skin is warm and dry. She is not diaphoretic.       Lesions consistent with vasculitis with black appearing central area with surrounding erythema, extremely tender to palpation, no drainage noted. Lesions present to helices of bilateral ears. Several small lesions to upper left arm and forearm. No lesions seen elsewhere.  Psychiatric: She has a normal mood and affect.    ED Course  Procedures (including critical care time)  Labs Reviewed  CBC WITH DIFFERENTIAL - Abnormal; Notable for the following:    WBC 3.3 (*)     All other components within normal limits   No results found.   1. SUBSTANCE ABUSE, MULTIPLE   2. VASCULITIS       MDM  Patient with history of crack cocaine abuse and levamisole induced vasculitis presents with lesions consistent with previous vasculitis located to her left arm. She is significantly uncomfortable appearing. She is not neutropenic and her platelets are within normal limits. Pt is considerably uncomfortable appearing. Feel she would benefit from IV steroids and pain management. Pt will be admitted for further evaluation and treatment. Discussed with Dr. Nicoletta Dress with the IM teaching service (who is actually the pt's PCP). She accepts pt for admission.  Abran Richard, PA-C 06/26/12 1646

## 2012-06-26 NOTE — ED Notes (Addendum)
Per EMS: Pt has red sores on her upper extremities bilaterally, buttocks, and ears. No lesions/sores on abd, back, or legs. Sores are red with black center, not symmetric.  Per Pt: Redness began about 2 days ago. Pain 8/10.

## 2012-06-26 NOTE — ED Provider Notes (Signed)
Medical screening examination/treatment/procedure(s) were conducted as a shared visit with non-physician practitioner(s) and myself.  I personally evaluated the patient during the encounter  Orlie Dakin, MD 06/26/12 [phone]

## 2012-06-26 NOTE — Consult Note (Signed)
Internal Medicine Teaching Service Attending Consult Note Date: 06/26/2012  Patient name: [name]  Medical record number: [id number]  Date of birth: [birth year]   I have seen and evaluated [name] and discussed their care with the Residency Team.    Ms. Tarlton is a 51yo woman with extensive history of coaine use and levimazole induced vasculitis.  She presented after taking cocaine again and having some new lesions appear on her arms and ears.  She was teary in the ED because she was requesting prednisone and ambien.  Her pain was well controlled with pain medicine in the ED.  When I saw her, she was sitting in bed, eating dinner and noted that her pain was well controlled.    She also reported chest pain early in the AM of Sunday.  A troponin X 1 was negative, making ACS unlikely.   Physical Exam: Blood pressure 108/60, pulse 66, resp. rate 20, SpO2 100.00%. General appearance: alert, cooperative and very thin Head: Normocephalic, without obvious abnormality, atraumatic Extremities: thin, + tattoos Skin: Multiple vasculitic lesions to arms, auricles  Lab results: Results for orders placed during the hospital encounter of 06/26/12 (from the past 24 hour(s))  CBC WITH DIFFERENTIAL     Status: Abnormal   Collection Time   06/26/12  3:03 PM      Component Value Range   WBC 3.3 (*) 4.0 - 10.5 K/uL   RBC 4.22  3.87 - 5.11 MIL/uL   Hemoglobin 12.8  12.0 - 15.0 g/dL   HCT 37.0  36.0 - 46.0 %   MCV 87.7  78.0 - 100.0 fL   MCH 30.3  26.0 - 34.0 pg   MCHC 34.6  30.0 - 36.0 g/dL   RDW 13.7  11.5 - 15.5 %   Platelets 292  150 - 400 K/uL   Neutrophils Relative 55  43 - 77 %   Neutro Abs 1.8  1.7 - 7.7 K/uL   Lymphocytes Relative 40  12 - 46 %   Lymphs Abs 1.3  0.7 - 4.0 K/uL   Monocytes Relative 4  3 - 12 %   Monocytes Absolute 0.1  0.1 - 1.0 K/uL   Eosinophils Relative 1  0 - 5 %   Eosinophils Absolute 0.0  0.0 - 0.7 K/uL   Basophils Relative 0  0 - 1 %   Basophils Absolute 0.0   0.0 - 0.1 K/uL  URINE RAPID DRUG SCREEN (HOSP PERFORMED)     Status: Abnormal   Collection Time   06/26/12  5:47 PM      Component Value Range   Opiates NONE DETECTED  NONE DETECTED   Cocaine POSITIVE (*) NONE DETECTED   Benzodiazepines NONE DETECTED  NONE DETECTED   Amphetamines NONE DETECTED  NONE DETECTED   Tetrahydrocannabinol POSITIVE (*) NONE DETECTED   Barbiturates NONE DETECTED  NONE DETECTED    Assessment and Plan: I agree with the formulated Assessment and Plan with the following changes:   Levimazole-induced vasculitis with new skin lesions.  Ms. Dagostino will be discharged from the ED.  Her pain can be controlled with oral pain medications and she will be given prednisone and a short course of doxycycline as treatment for her vasculitic lesions.  She will have an appointment to be seen in clinic on Tuesday for further evaluation.  Her chest pain was possibly due to cocaine use, though she denies cocaine use today.  Regardless, this is unlikely to be ACS.  Further evaluation can  be performed as an outpatient.   She is also requesting a refill of her Lorrin Mais, which Dr. Nicoletta Dress will provide prior to discharge.   The case was discussed with Dr. Nicoletta Dress (resident) and Dr. Alice Rieger (intern)  Sid Falcon, MD 9/22/20137:41 PM

## 2012-06-28 ENCOUNTER — Encounter: Payer: Self-pay | Admitting: Internal Medicine

## 2012-06-28 ENCOUNTER — Ambulatory Visit (INDEPENDENT_AMBULATORY_CARE_PROVIDER_SITE_OTHER): Payer: Medicaid Other | Admitting: Internal Medicine

## 2012-06-28 VITALS — BP 138/90 | HR 68 | Temp 98.1°F | Ht 61.5 in | Wt 99.5 lb

## 2012-06-28 DIAGNOSIS — I776 Arteritis, unspecified: Secondary | ICD-10-CM

## 2012-06-28 DIAGNOSIS — F1910 Other psychoactive substance abuse, uncomplicated: Secondary | ICD-10-CM

## 2012-06-28 MED ORDER — TRIPLE ANTIBIOTIC 5-400-5000 EX OINT: TOPICAL_OINTMENT | Freq: Three times a day (TID) | CUTANEOUS | Status: AC

## 2012-06-28 NOTE — Assessment & Plan Note (Signed)
Pt trying to quit cocaine use. Referred to social work today.

## 2012-06-28 NOTE — Patient Instructions (Addendum)
Continue to take the Prednisone and Doxycycline as prescribed. Apply antibiotic ointment to affected areas.  Continue to take your other medications as prescribed.  Please follow up with me on Friday morning.  Vasculitis Vasculitis is when your blood vessels are inflamed. There are many different blood vessels in the body, and vasculitis can affect any of them. This includes large (veins and arteries) and small (capillaries) vessels. With vasculitis,    Blood vessel walls can become thick.   Blood vessels can become narrow.   Blood vessels can become weak. Sometimes, it becomes so weak that the blood vessel bulges out like a balloon. This is called an aneurysm. Aneurysms are rare but can be life-threatening.   Scarring can occur.   Not enough blood can flow through the blood vessels.  All of these things can damage many parts of the body, including the muscles, kidneys, lungs and brain. There are many types of vasculitis. Some types are short-term (acute), while others are long-term (chronic). Some types may go away without treatment, and others may need to be treated for a long time.   CAUSES   Vasculitis occurs when the body's immune system (which fights germs and disease) makes a mistake. It attacks its own blood vessels. This causes inflammation (the body's way of reacting to injury or infection).    Why this happens is usually not known. The condition is then called primary vasculitis.   Sometimes, something triggers the inflammation. This is called secondary vasculitis. Possible causes include:   Infections.   An immune system disease. Examples include lupus, rheumatoid arthritis and scleroderma.   An allergic reaction to a medicine.   Cancer that affects blood cells. This includes leukemia and lymphoma.   Males and females of all ages and races can develop vasculitis. Some risk factors make vasculitis more likely. These include:   Smoking.   Stress.   Physical injury.    SYMPTOMS   There are more than 20 types of vasculitis. Symptoms of each type vary, but some symptoms are common.  Many people with vasculitis:   Have a fever.   Do not feel like eating.   Lose weight.   Feel very tired.   Have aches and pains.   Feel weak.   Start to not have feeling (numbness) in an area.   Symptoms for some types of vasculitis also could be:   Sores in the mouth or eyes.   Skin problems. This could be sores, spots or rashes.   Trouble seeing.   Trouble breathing.   Blood in the urine.   Headaches.   Pain in the abdomen.   Stuffy or bloody nose.  DIAGNOSIS   Vasculitis symptoms are similar to symptoms of many other conditions. That can make it hard to tell if you have vasculitis. To be sure, your caregiver will ask about your symptoms and do a physical exam. Certain tests may be necessary, such as:    A complete blood count (CBC). This test shows how many red blood cells are in your blood. Not having enough red blood cells (anemic) can result from vasculitis.   Erythrocyte sedimentation (also called sed rate test). It measures inflammation in the body.   C reactive protein (CRP). This also shows if there is inflammation.   Anti-neutrophil cytoplasmic antibodies (ANCA). This can tell if the immune system is reacting to certain cells in the blood.   A urine test. This checks for blood or protein in the urine. That could  be a sign of kidney damage from vasculitis.   Imaging tests. These tests create pictures from inside the body. Options include:   X-rays.   Computed tomography (CT) scan. This uses X-rays guided by a computer.   Ultrasound. It creates an image using sound waves.   Magnetic resonance imaging (MRI). It uses radio waves, magnets and a computer.   Angiography. A dye is put into your blood vessels. Then, an X-ray is taken of them.   A biopsy of a blood vessel. This means your caregiver will take out a small piece of a blood  vessel. Then, it is checked under a microscope. This is an important test. It often is the best way to know for sure if you have vasculitis.  TREATMENT    Treatment will depend on the type of vasculitis and how severe it is. Often, you will need to see a specialist in immunologic diseases (rheumatologist).   Some types of vasculitis may go away without treatment.   Some types need only over-the-counter drugs.   Prescription medicines are used to treat many types of vasculitis. For example:   Corticosteroids. These are the drugs used most often. They are very powerful. Usually, a high dose is taken until symptoms improve. Then, the dose is gradually decreased. Using corticosteroids for a long time can cause problems. They can make muscles and bones weak. They can cause blood pressure to go up, and cause diabetes. Also, people often gain weight when they take corticosteroids.   Cytotoxic drugs. These kill cells that cause inflammation. Sometimes, they are used if corticosteroids do not help. Other times, both medications are taken.   Surgery. This may be needed to repair a blood vessel that has bulged out (aneurysm).   Treatment can sometimes cure your disease. Other times, it can put the disease in remission (no symptoms). Increased treatment and reevaluation might be necessary if your disease comes back or flares.  HOME CARE INSTRUCTIONS    Take any medications that your caregiver prescribes. Follow the directions carefully.   Watch for any problems that can be caused by a drug (side effects). Tell your caregiver right away if you notice any changes or problems.   Keep all appointments for checkups. This is important to help your caregiver watch for side effects. Checkups may include:   Periodic blood tests.   Bone density testing. This checks how strong or weak your bones are.   Blood pressure checks. If your blood pressure rises, you may need to take a drug to control it while you are  taking corticosteroids.   Blood sugar checks. This is to be sure you are not developing diabetes. If you have diabetes, corticosteroid medications may make it worse and require increased treatment.   Exercise. First, talk with your caregiver about what would be OK for you to do. Aerobic exercise (which increases your heart rate) is often suggested. It includes walking. This type of exercise is good because it helps prevent bone loss. It also helps control your blood pressure.   Follow a healthy diet. Include good sources of protein in your diet. Also include fruits, vegetables and whole grains. Your caregiver can refer you to an expert on healthy eating (dietitian) for more detailed advice.   Learn as much as you can about vasculitis. Understanding your condition can help you cope with it. Coping can be hard because this may be something you will have to live with for years.   Consider joining a support  group. It often helps to talk about your worries with others who have the same problems.   Tell your caregiver if you feel stressed, anxious or depressed. Your caregiver may refer you to a specialist, or recommend medication to relieve your symptoms.  SEEK MEDICAL CARE IF:    The symptoms that led to your diagnosis return.   You develop worsening fever, fatigue, headache, weight loss or pain in your jaw.   You develop signs of infection. Infections can be worse if you are on corticosteroid medication.   You develop any new or unexplained symptoms of disease.  SEEK IMMEDIATE MEDICAL CARE IF:    Your eyesight changes.   Pain does not go away, even after taking medication.   You feel pain in your chest or abdomen.   You have trouble breathing.   One side of your face or body becomes suddenly weak or numb.   Your nose bleeds.   There is blood in your urine.   You develop a fever of more than 102 F (38.9 C).  Document Released: 07/18/2009 Document Revised: 09/10/2011 Document  Reviewed: 07/18/2009 [facility] Patient Information 2012 [place].

## 2012-06-28 NOTE — Assessment & Plan Note (Addendum)
Last cocaine use was 4 days ago. Lesions stable per pt. She was started on Prednisone and Doxy in the ED, which she just started yesterday. She is to continue the steroid and abx, and f/u in clinic on Friday.

## 2012-06-28 NOTE — Progress Notes (Signed)
INTERNAL MEDICINE TEACHING ATTENDING ADDENDUM [name] , MD: I personally saw and evaluated [name] in this clinic visit in conjunction with the resident, Dr. Eulas Post. I have discussed the patient's plan of care with Dr. Eulas Post during this visit. I have confirmed the physical exam findings and have read and agree with the clinic note including the plan. Patient has been ANA positive in the past but i do not see the titers to ascertain if that is significant or not. We also need to check an ANCA panel. 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

## 2012-06-28 NOTE — Progress Notes (Signed)
[name], female   DOB: 1960-12-31, 51 y.o.   MRN: [id number]  Subjective:   [name]   DOB: [birth year] 51 y.o.   MRN: [id number]  HPI: [name] is a 51 y.o. female w/ a h/o significant cocaine use and lemivazole-induced vasculitis who presents for an ED f/u after being treated for lemivazole-induced vasculitis.  She is doing well overall. On Monday she began the Prednisone and the Doxycycline prescribed to her in the ED, and feels like her lesions are stable. She has experienced this vasculitis in the past and states that steroids and abx always help her sx.  She denies any further episodes of chest pain.   She has not used cocaine since before presenting to the ED. She is trying to avoid using, but states that it is very difficult. She has undergone rehab in the past w/o success.    Past Medical History  Diagnosis Date  . 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 Followed by Dr. Louanne Skye  . Hypertension   . Cocaine abuse     ongoing with resultant vaculitis.  . Tobacco abuse   . Depression   . Normocytic anemia     BL Hgb 9.8-12. Last anemia panel 04/2010 - showing Fe 19, ferritin 101.  Pt on monthly B12 injections  . Rheumatoid arthritis     patient reported   Current Outpatient Prescriptions  Medication Sig Dispense Refill  . amLODipine (NORVASC) 2.5 MG tablet Take 1 tablet (2.5 mg total) by mouth daily.  30 tablet  5  . aspirin EC 81 MG EC tablet Take 1 tablet (81 mg total) by mouth daily.  30 tablet  3  . diphenhydrAMINE (BENADRYL) 25 mg capsule Take 25 mg by mouth every 6 (six) hours as needed. itching       . doxycycline (VIBRA-TABS) 100 MG tablet Take 1 tablet (100 mg total) by mouth 2 (two) times daily.  10 tablet  0  . FLUoxetine (PROZAC) 20 MG capsule Take 40 mg by mouth daily.      . hydrocodone-ibuprofen (VICOPROFEN) 5-200 MG per tablet Take 1 tablet by mouth every 8 (eight) hours as needed for pain.  10 tablet  0  . lisinopril  (PRINIVIL,ZESTRIL) 20 MG tablet Take 20 mg by mouth daily. For blood pressure       . Multiple Vitamin (MULTIVITAMIN WITH MINERALS) TABS Take 1 tablet by mouth daily.      . mupirocin ointment (BACTROBAN) 2 % Apply topically 3 (three) times daily.      Marland Kitchen neomycin-bacitracin-polymyxin (NEOSPORIN) [phone] ointment Apply topically 3 (three) times daily.  28.3 g  0  . predniSONE (DELTASONE) 20 MG tablet 3 Tabs PO Days 1-3, then 2 tabs PO Days 4-6, then 1 tab PO Day 7-9, then Half Tab PO Day 10-12  20 tablet  0  . traMADol (ULTRAM) 50 MG tablet Take 1 tablet (50 mg total) by mouth every 6 (six) hours as needed. For pain  30 tablet  0  . vitamin B-12 (CYANOCOBALAMIN) 1000 MCG tablet Take 1,000 mcg by mouth daily.        Marland Kitchen zolpidem (AMBIEN) 10 MG tablet Take 1 tablet (10 mg total) by mouth at bedtime.  30 tablet  0   Family History  Problem Relation Age of Onset  . Breast cancer Mother     Breast cancer  . Alcohol abuse Mother   . Colon cancer Maternal Aunt 18  .  Alcohol abuse Father    History   Social History  . Marital Status: Single    Spouse Name: N/A    Number of Children: 0  . Years of Education: 11th grade   Occupational History  . Disability     since 2011, due to her rheumatoid arthritis   Social History Main Topics  . Smoking status: Current Every Day Smoker -- 0.2 packs/day for 39 years    Types: Cigarettes  . Smokeless tobacco: Never Used   Comment: trying  . Alcohol Use: No  . Drug Use: Yes    Special: "Crack" cocaine, Marijuana, Cocaine     smokes cocaine  . Sexually Active: No   Other Topics Concern  . 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  HEENT: Denies photophobia, eye pain, redness, hearing loss, ear  pain, congestion, sore throat, rhinorrhea, sneezing, mouth sores, trouble swallowing, neck pain, neck stiffness and tinnitus.   Respiratory: Denies SOB, DOE, cough, chest tightness,  and wheezing.   Cardiovascular: Denies chest pain, palpitations and leg swelling.  Gastrointestinal: Denies nausea, vomiting, abdominal pain, diarrhea, constipation, blood in stool and abdominal distention.  Genitourinary: Denies dysuria, urgency, frequency, hematuria, flank pain and difficulty urinating.  Musculoskeletal: Denies myalgias, back pain, joint swelling, arthralgias and gait problem.  Skin: Endorses stable lesions on ears, BUE, and resolving lesions on cheeks.  Neurological: Denies dizziness, seizures, syncope, weakness, light-headedness, numbness and headaches.  Psychiatric/Behavioral: Denies suicidal ideation or mood changes.  Objective:  Physical Exam: Filed Vitals:   06/28/12 0957  BP: 138/90  Pulse: 68  Temp: 98.1 F (36.7 C)  TempSrc: Oral  Height: 5' 1.5" (1.562 m)  Weight: 99 lb 8 oz (45.133 kg)  SpO2: 100%   Constitutional: Vital signs reviewed.  Patient is a well-developed and well-nourished female in no acute distress and cooperative with exam. Alert and oriented x3.  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 Eyes: PERRL, EOMI, no scleral icterus.  Neck: Supple, Trachea midline normal ROM, No JVD, mass, or thyromegaly.  Cardiovascular: RRR, S1 normal, S2 normal, no MRG, pulses symmetric and intact bilaterally Pulmonary/Chest: CTAB, no wheezes, rales, or rhonchi Abdominal: Soft. Non-tender, non-distended Musculoskeletal: No joint deformities, erythema, or stiffness, ROM full and no nontender Hematology: No cervical adenopathy.  Neurological: A&O x3, Strength is normal and symmetric bilaterally, cranial nerve II-XII are grossly intact, no focal motor deficit, sensory intact to light touch bilaterally.  Skin: Necrotic lesions on right and  left ear pinnas, BUE, and minimally on both cheeks. Warm and intact.  Psychiatric: Normal mood and affect. Speech and behavior appear normal.   Assessment & Plan:    Please refer to Problem List based A&P.

## 2012-06-29 LAB — ANA: Anti Nuclear Antibody(ANA): NEGATIVE

## 2012-06-30 LAB — ANCA TITERS: P-ANCA: 1:640 {titer} — ABNORMAL HIGH

## 2012-07-01 ENCOUNTER — Encounter: Payer: Self-pay | Admitting: Internal Medicine

## 2012-07-01 ENCOUNTER — Ambulatory Visit (INDEPENDENT_AMBULATORY_CARE_PROVIDER_SITE_OTHER): Payer: Medicaid Other | Admitting: Internal Medicine

## 2012-07-01 VITALS — BP 127/80 | HR 72 | Temp 97.5°F | Ht 61.5 in | Wt 100.5 lb

## 2012-07-01 DIAGNOSIS — F329 Major depressive disorder, single episode, unspecified: Secondary | ICD-10-CM

## 2012-07-01 DIAGNOSIS — I776 Arteritis, unspecified: Secondary | ICD-10-CM

## 2012-07-01 DIAGNOSIS — F1910 Other psychoactive substance abuse, uncomplicated: Secondary | ICD-10-CM

## 2012-07-01 NOTE — Assessment & Plan Note (Signed)
Checked ANA and ANCAs on Tues. ANA and c-ANCA neg, p-ANCA is positive. Pt sees Dr. Hardie Shackleton at [facility] for RA and has an appt in Dec. Will call to have the pt seen sooner due to the vasculitis. Vasculitis probably due to autoimmune process vs cocaine use. She is to finish the abx and steroid and use the abx oint on her wounds.

## 2012-07-01 NOTE — Patient Instructions (Addendum)
Continue to take the Prednisone and Doxycycline.   Please see Dr. Meda Coffee on 10/31.   Return to clinic to see [place] in 3 months or sooner if needed.

## 2012-07-01 NOTE — Progress Notes (Signed)
[name], female   DOB: [birth year], 51 y.o.   MRN: [id number]  Subjective:   [name]   DOB: 1961/06/05 51 y.o.   MRN: [id number]  HPI: [name] is a 51 y.o. female w/ a h/o significant cocaine use and lemivazole-induced vasculitis who presents on day 5 of treatment for lemivazole-induced vasculitis with Prednisone and Doxycycline.  ANA and ANCA were checked at her last visit to r/o other causes of vasculitis. She previously had a positive ANA level, but now it is negative, as is her c-ANCA. However, her p-ANCA is positive. She already sees Dr. Hardie Shackleton with Rheumatology at [facility] for RA.   Her areas of vasculitis are improving. She continues to take the abx and steroid as prescribed. She is using antibiotic ointment on her ears and her arms, which also seems to be helping.   She did use cocaine yesterday due to depression, but she states that this is the 1st time since being seen in the ED that she has used.   Past Medical History  Diagnosis Date  . 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 Followed by Dr. Louanne Skye  . Hypertension   . Cocaine abuse     ongoing with resultant vaculitis.  . Tobacco abuse   . Depression   . Normocytic anemia     BL Hgb 9.8-12. Last anemia panel 04/2010 - showing Fe 19, ferritin 101.  Pt on monthly B12 injections  . Rheumatoid arthritis     patient reported   Current Outpatient Prescriptions  Medication Sig Dispense Refill  . amLODipine (NORVASC) 2.5 MG tablet Take 1 tablet (2.5 mg total) by mouth daily.  30 tablet  5  . diphenhydrAMINE (BENADRYL) 25 mg capsule Take 25 mg by mouth every 6 (six) hours as needed. itching       . doxycycline (VIBRA-TABS) 100 MG tablet Take 1 tablet (100 mg total) by mouth 2 (two) times daily.  10 tablet  0  . FLUoxetine (PROZAC) 20 MG capsule Take 40 mg by mouth daily.      Marland Kitchen lisinopril (PRINIVIL,ZESTRIL) 20 MG tablet Take 20 mg by mouth daily. For blood pressure       . Multiple  Vitamin (MULTIVITAMIN WITH MINERALS) TABS Take 1 tablet by mouth daily.      . mupirocin ointment (BACTROBAN) 2 % Apply topically 3 (three) times daily.      . predniSONE (DELTASONE) 20 MG tablet 3 Tabs PO Days 1-3, then 2 tabs PO Days 4-6, then 1 tab PO Day 7-9, then Half Tab PO Day 10-12  20 tablet  0  . traMADol (ULTRAM) 50 MG tablet Take 1 tablet (50 mg total) by mouth every 6 (six) hours as needed. For pain  30 tablet  0  . vitamin B-12 (CYANOCOBALAMIN) 1000 MCG tablet Take 1,000 mcg by mouth daily.        Marland Kitchen zolpidem (AMBIEN) 10 MG tablet Take 1 tablet (10 mg total) by mouth at bedtime.  30 tablet  0  . aspirin EC 81 MG EC tablet Take 1 tablet (81 mg total) by mouth daily.  30 tablet  3  . hydrocodone-ibuprofen (VICOPROFEN) 5-200 MG per tablet Take 1 tablet by mouth every 8 (eight) hours as needed for pain.  10 tablet  0  . neomycin-bacitracin-polymyxin (NEOSPORIN) [phone] ointment Apply topically 3 (three) times daily.  28.3 g  0   Family History  Problem Relation Age of Onset  .  Breast cancer Mother     Breast cancer  . Alcohol abuse Mother   . Colon cancer Maternal Aunt 65  . Alcohol abuse Father    History   Social History  . Marital Status: Single    Spouse Name: N/A    Number of Children: 0  . Years of Education: 11th grade   Occupational History  . Disability     since 2011, due to her rheumatoid arthritis   Social History Main Topics  . Smoking status: Current Every Day Smoker -- 0.2 packs/day for 39 years    Types: Cigarettes  . Smokeless tobacco: Never Used   Comment: trying  . Alcohol Use: No  . Drug Use: Yes    Special: "Crack" cocaine, Marijuana, Cocaine     smokes cocaine  . Sexually Active: No   Other Topics Concern  . 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  HEENT: Denies photophobia, eye pain, redness, hearing loss, ear pain, congestion, sore throat, rhinorrhea, sneezing, mouth sores, trouble swallowing, neck pain, neck stiffness and tinnitus.   Respiratory: Denies SOB, DOE, cough, chest tightness,  and wheezing.   Cardiovascular: Denies chest pain, palpitations and leg swelling.  Gastrointestinal: Denies nausea, vomiting, abdominal pain, diarrhea, constipation, blood in stool and abdominal distention.  Genitourinary: Denies dysuria, urgency, frequency, hematuria, flank pain and difficulty urinating.  Musculoskeletal: Denies myalgias, back pain, joint swelling, arthralgias and gait problem.  Skin: Wounds healing well.  Neurological: Denies dizziness, seizures, syncope, weakness, light-headedness, numbness and headaches.  Psychiatric/Behavioral: Endorses depression at times. Denies suicidal ideation.  Objective:  Physical Exam: Filed Vitals:   07/01/12 0930 07/01/12 0955  BP: 180/96 127/80  Pulse: 84 72  Temp: 97.5 F (36.4 C)   TempSrc: Oral   Height: 5' 1.5" (1.562 m)   Weight: 100 lb 8 oz (45.587 kg)   SpO2: 100%    Constitutional: Vital signs reviewed.  Patient is a well-developed and well-nourished female in no acute distress and cooperative with exam. Alert and oriented x3.  Head: Normocephalic and atraumatic Ear: The eschars on the pinna of both ears are now gone with moist pink tissue now present. Mouth: no erythema or exudates, MMM Eyes: PERRL, EOMI, conjunctivae normal, No scleral icterus.  Neck: Supple, Trachea midline normal ROM, no JVD, mass, or thyromegaly.  Cardiovascular: RRR, no MRG, pulses symmetric and intact bilaterally Pulmonary/Chest: CTAB, no wheezes, rales, or rhonchi Abdominal: Soft, non-tender, non-distended Musculoskeletal: No joint deformities, erythema, or stiffness, ROM full and no nontender Hematology: No cervical adenopathy.  Neurological: A&O x3,  Strength is normal and symmetric bilaterally, cranial nerve II-XII are grossly intact. Skin: Vasculitis improving. Wounds are healing well, and are C/D/I.  Psychiatric: Normal mood and affect. Speech and behavior are normal.   Assessment & Plan:   Please refer to Problem List based A&P.

## 2012-07-01 NOTE — Assessment & Plan Note (Signed)
Last used cocaine yesterday- 1st use since being seen in the ED. She wants to quit and is interested in rehab or something similar. Will ask the SW to contact the pt to set this up.

## 2012-07-04 NOTE — Progress Notes (Signed)
INTERNAL MEDICINE TEACHING ATTENDING ADDENDUM - Dominic Pea, DO: I personally saw and evaluated [name] in this clinic visit in conjunction with the resident, Dr. Eulas Post. I have discussed patient's plan of care with medical resident during this visit. I have confirmed the physical exam findings and have read and agree with the clinic note including the plan.  Needs Rheum follow up due to positive p-ANCA.

## 2012-07-04 NOTE — Progress Notes (Signed)
Pt aware of appt with Dr Hardie Shackleton at [facility] for 08/04/12 11:40AM - office notes faxed. Hilda Blades Maniyah Moller RN 07/04/12 11:15AM.

## 2012-07-07 ENCOUNTER — Telehealth: Payer: Self-pay | Admitting: Licensed Clinical Social Worker

## 2012-07-07 NOTE — Telephone Encounter (Signed)
CSW attempted to reach pt at [place] facility.  Pt not available.  CSW has already attempted to reach pt regarding this issue late Aug. 2013.  Ms. Brasington will need to contact the treatment facilities when she is ready.  CSW will mail Ms. Spratt AOD resources available to her.  Pt will have resources will need to contact them when pt is ready to quit.  CSW will sign off.

## 2012-07-09 IMAGING — CR DG ABDOMEN ACUTE W/ 1V CHEST
3 series · 3 of 3 positions shown · non-contrast
Comparison: 04/11/2009

CLINICAL DATA: Abdominal pain

ACUTE ABDOMEN SERIES (ABDOMEN 2 VIEW & CHEST 1 VIEW)

[w chest pa]
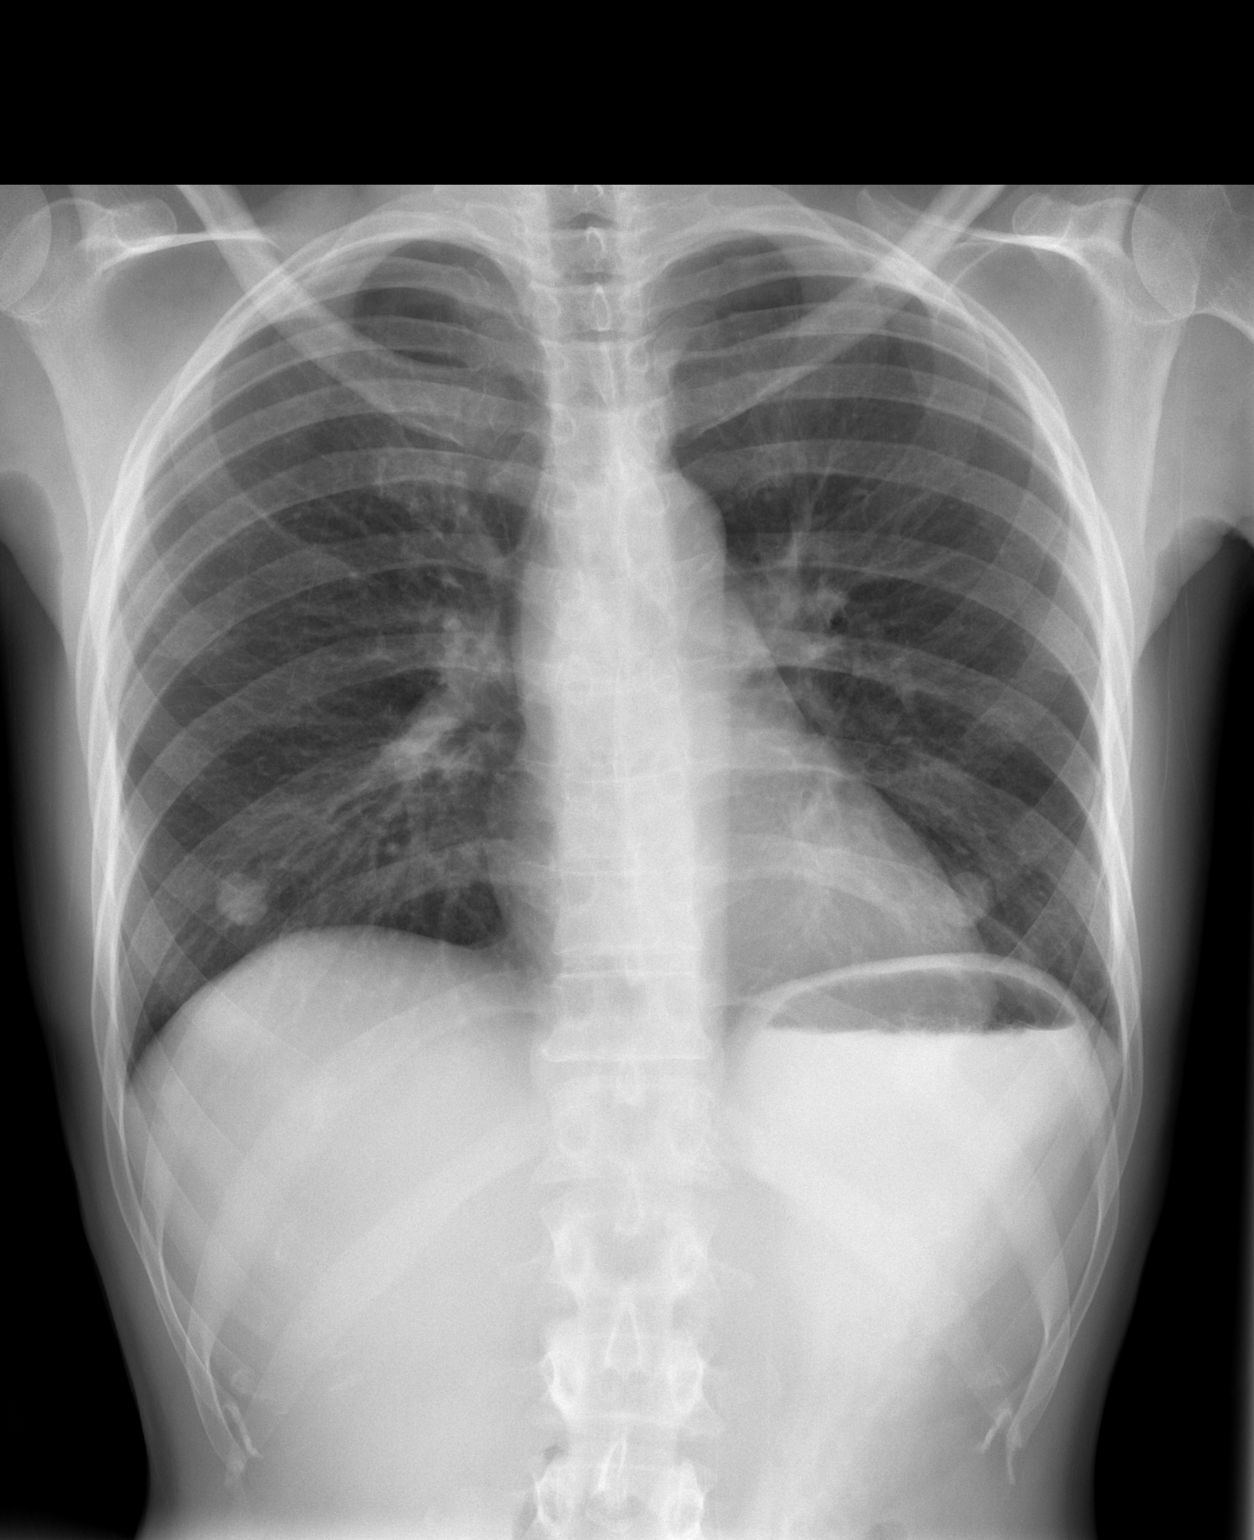

[w abdomen upright]
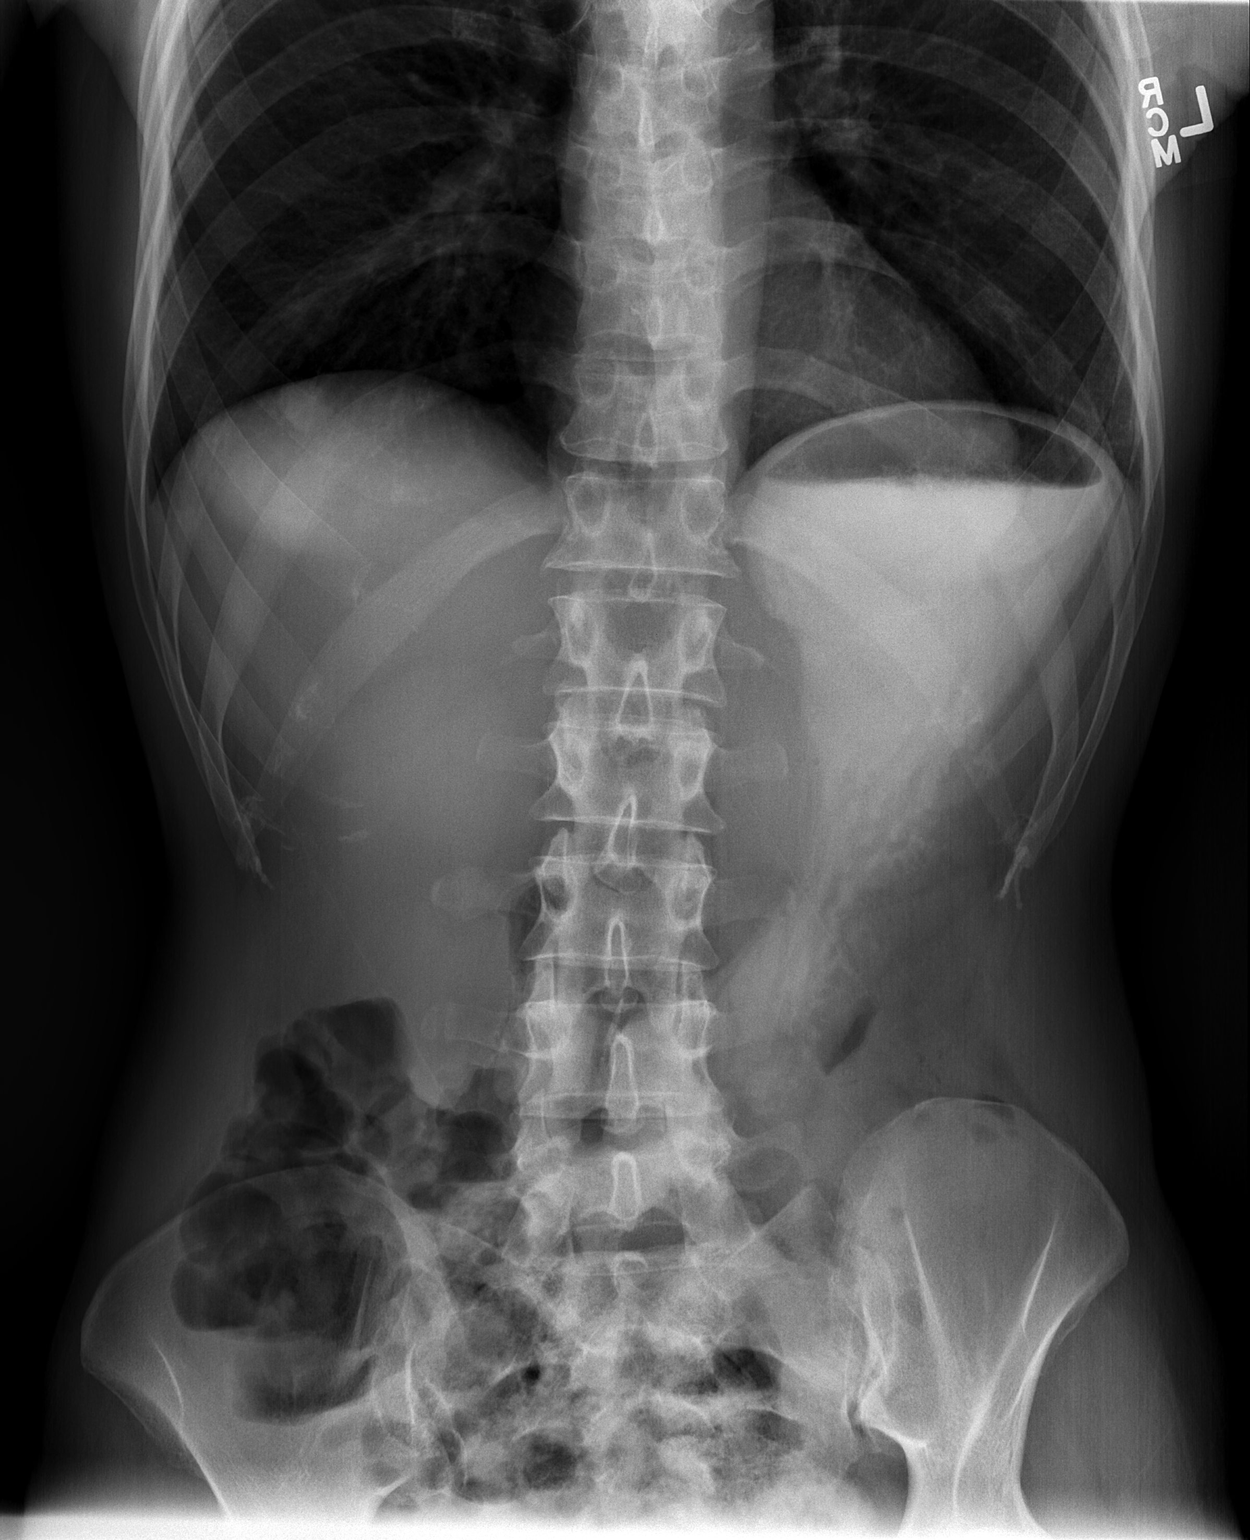

[t abdomen supine]
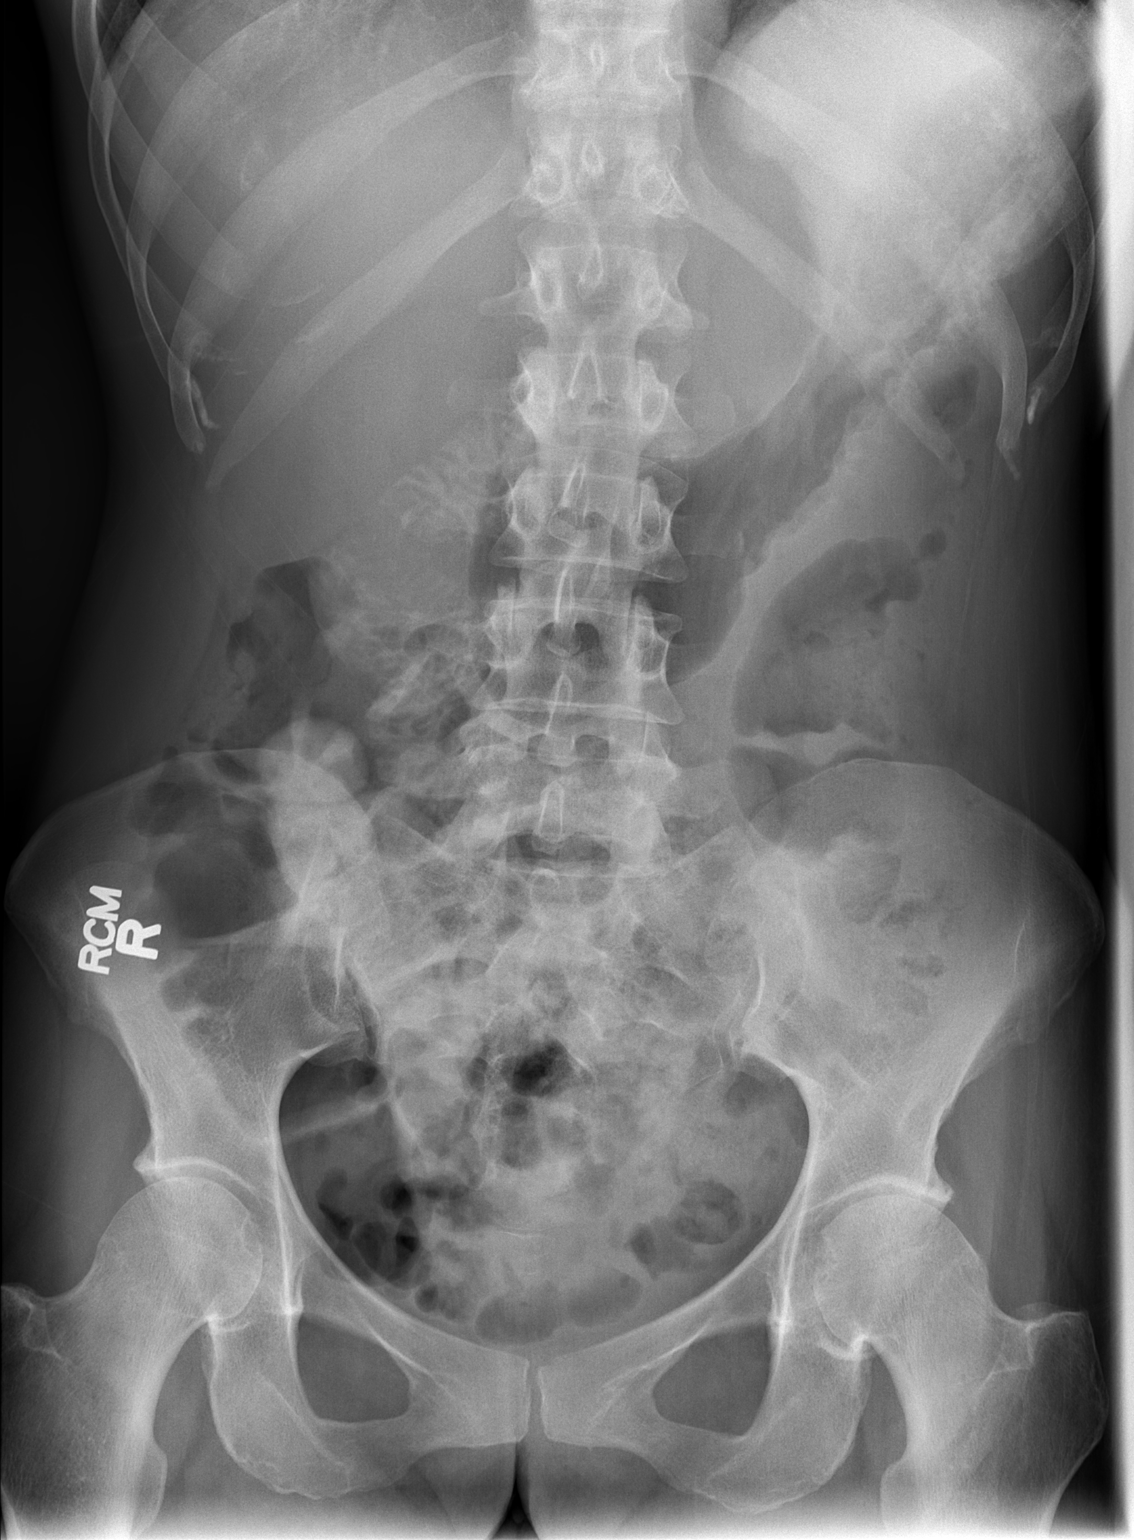

[3 of 3 positions shown; findings below may reference images not displayed]

FINDINGS: Prominent nipple shadows project over the lung bases.
The lungs are otherwise clear.  The heart is normal in size.  No
dilated small bowel loops are identified.  There is no intra-
abdominal free air.  Gas and stool seen in the colon.  The osseous
structures are normal.
IMPRESSION: No acute cardiopulmonary disease.  Nonobstructive bowel gas
pattern.

## 2012-07-15 IMAGING — CR DG CHEST 2V
1 series · 1 of 1 positions shown · non-contrast
Comparison: Multiple priors. The most recent are 06/08/2010,
06/24/2010, and 06/25/2010.

CLINICAL DATA: Chest pain

CHEST - 2 VIEW

[w chest lat]
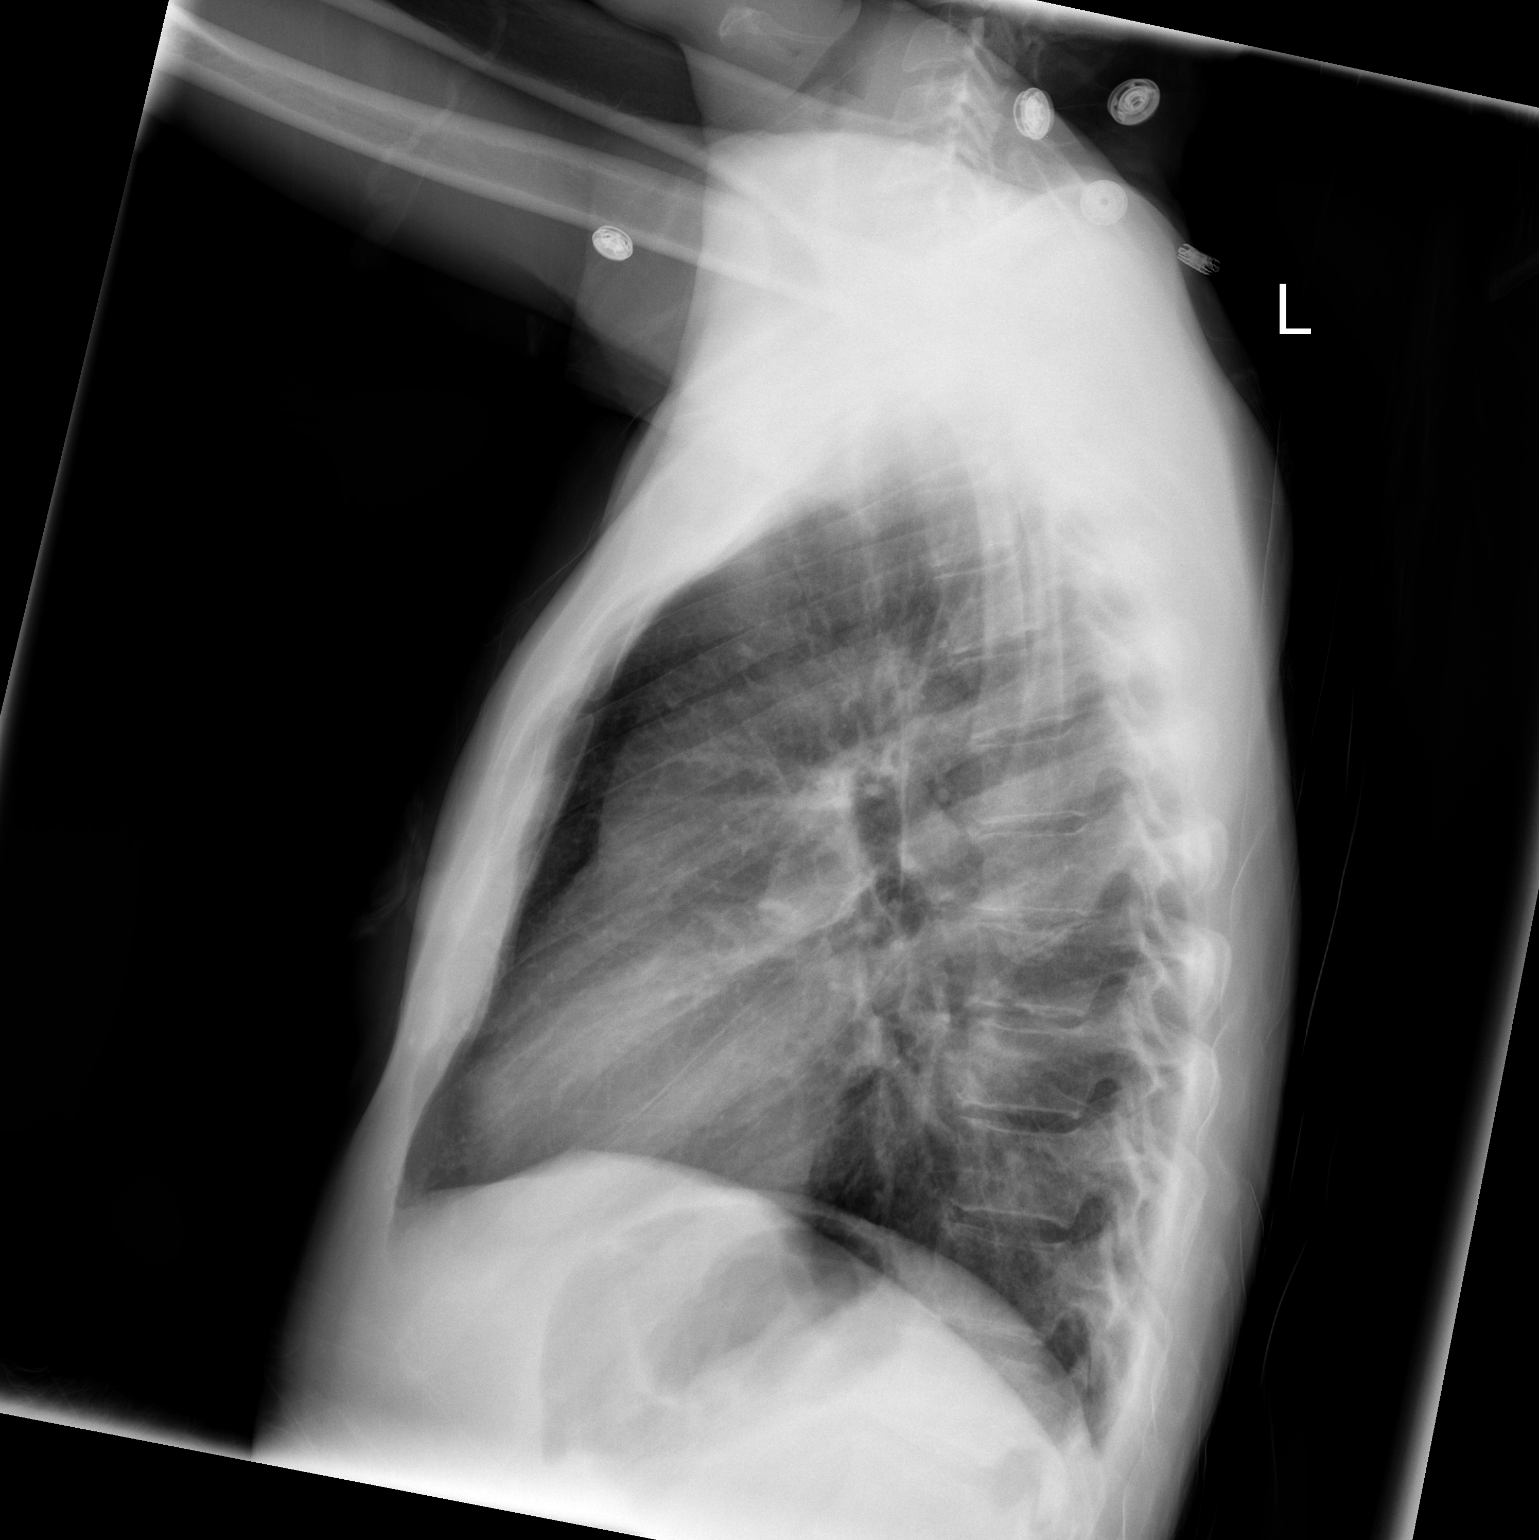

[1 of 1 positions shown; findings below may reference images not displayed]

FINDINGS: Bilateral nipple shadows are present.  Heart and
mediastinal contours are normal.  The lungs are clear.  No pleural
effusion or pneumothorax.  Trachea is midline.  Rudimentary
cervical ribs at C7.  No acute bony abnormality.
IMPRESSION: No acute cardiopulmonary disease.

## 2012-07-15 IMAGING — CT CT ANGIO CHEST
2 of 6 series · 19 of 36 positions shown · IV contrast (omnipaque)
Comparison: C t chest 04/27/2010, 01/30/2010

CLINICAL DATA: Right-sided chest pain, coughing,

CT ANGIOGRAPHY CHEST WITH CONTRAST
TECHNIQUE: Multidetector CT imaging of the chest was performed
using the standard protocol during bolus administration of
intravenous contrast.  Multiplanar CT image reconstructions
including MIPs were obtained to evaluate the vascular anatomy.
Contrast:  100 ml Omnipaque 300

[Series 11: pulm embolism 1.0 b25f thins · axial · 0.70mm/px · z∈[-262,-12]mm · 18 of 278 slices shown]
[im 14/278  lung]
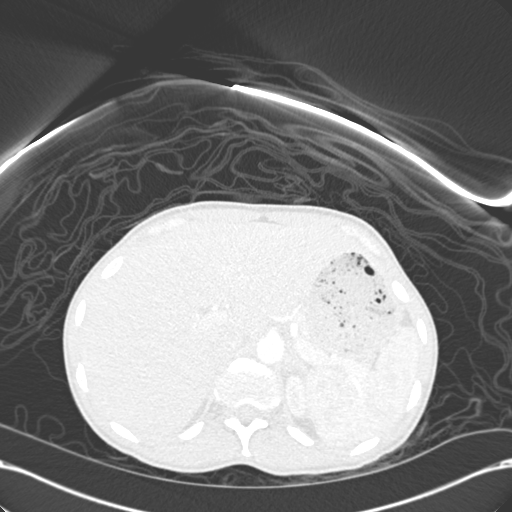
[im 28/278  mediastinal]
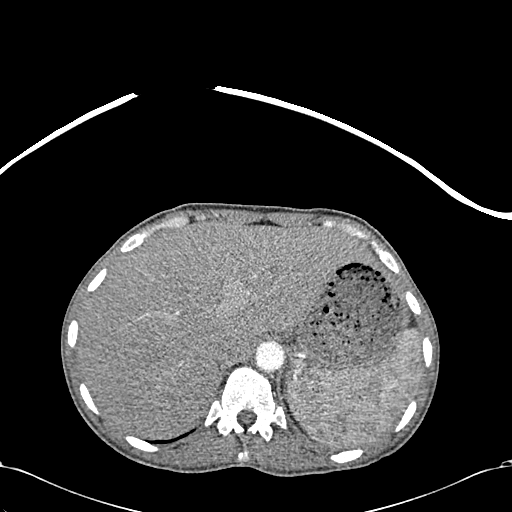
[im 42/278  lung]
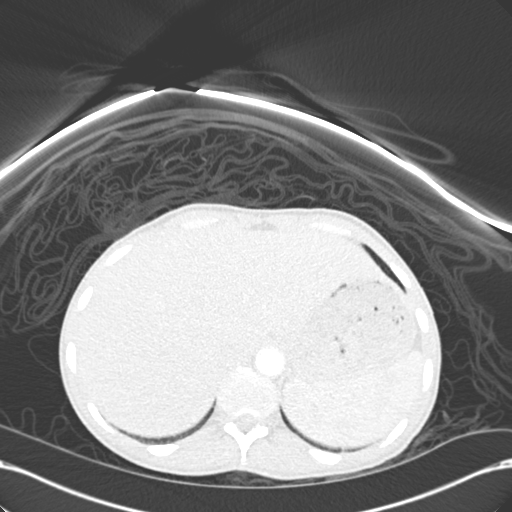
[im 56/278  mediastinal]
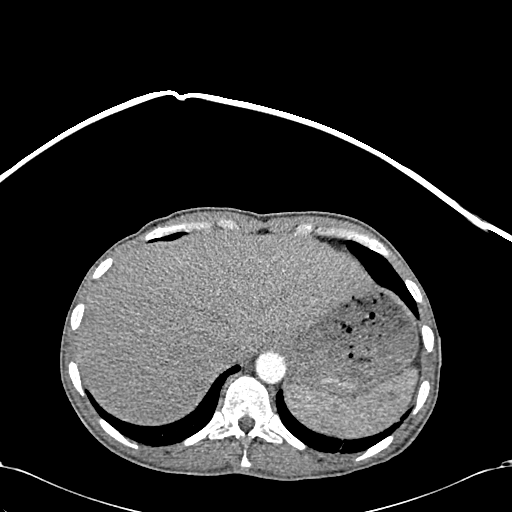
[im 70/278  lung]
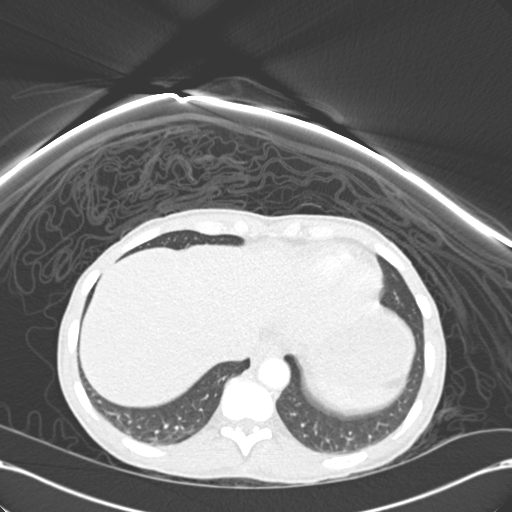
[im 84/278  mediastinal]
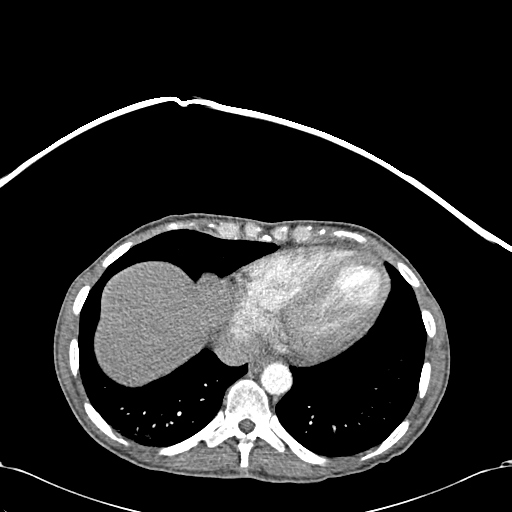
[im 97/278  lung]
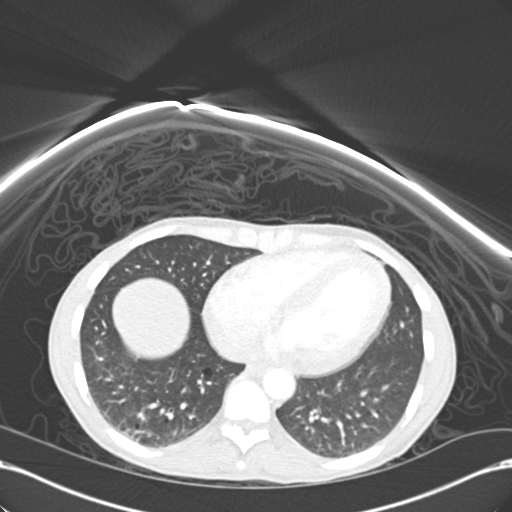
[im 111/278  mediastinal]
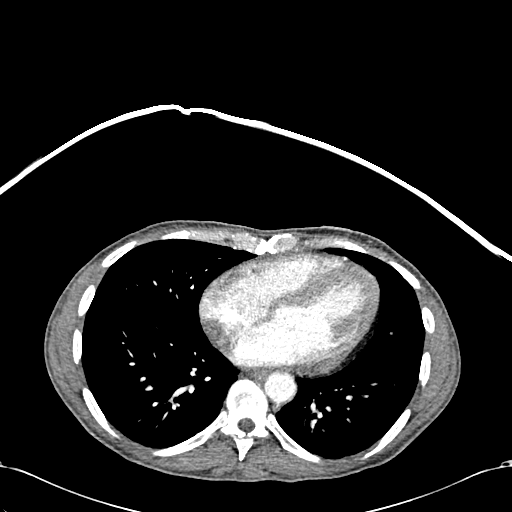
[im 125/278  lung]
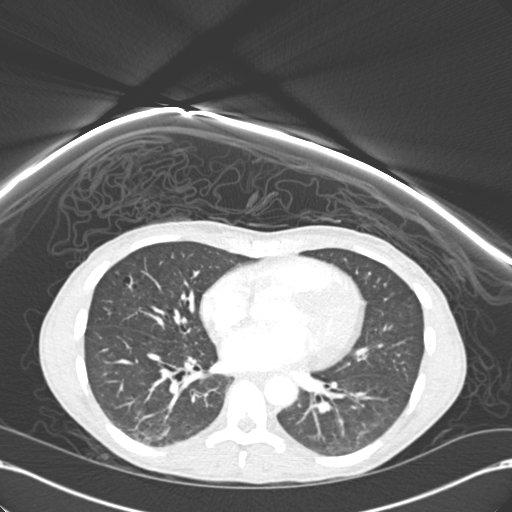
[im 153/278  mediastinal]
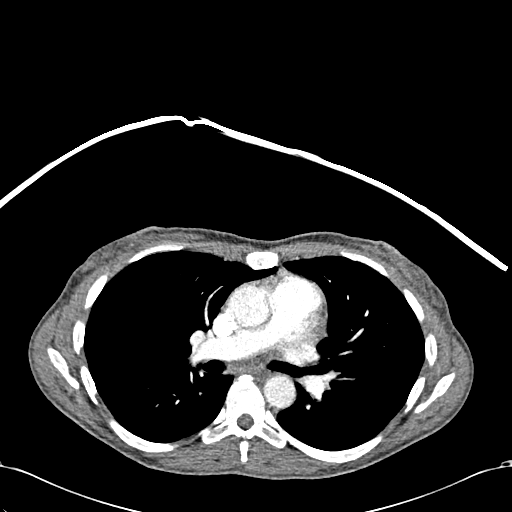
[im 167/278  lung]
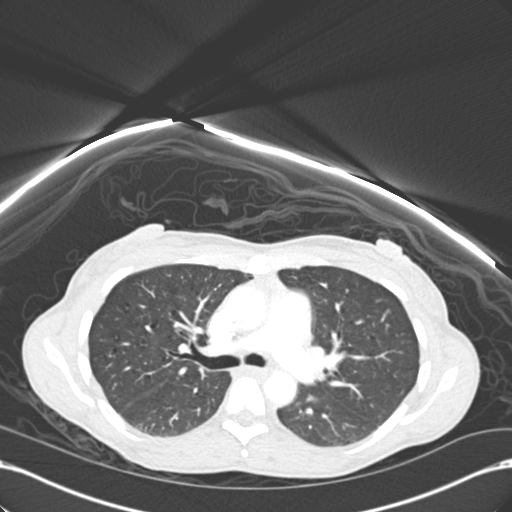
[im 181/278  mediastinal]
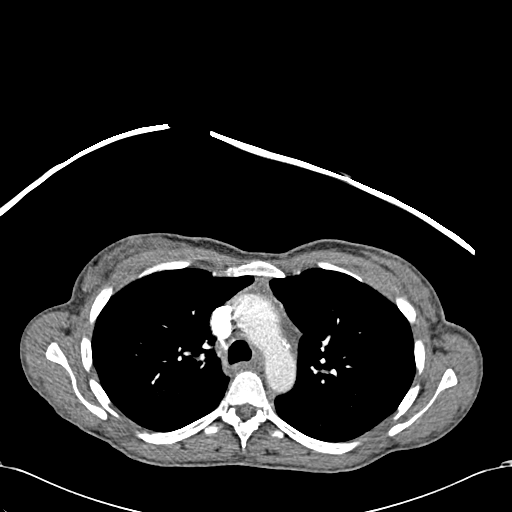
[im 194/278  lung]
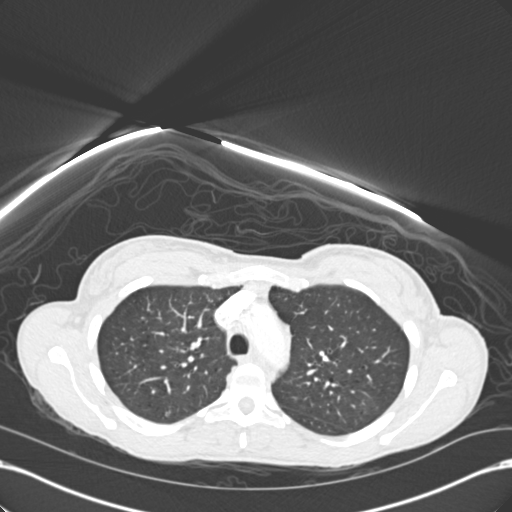
[im 208/278  mediastinal]
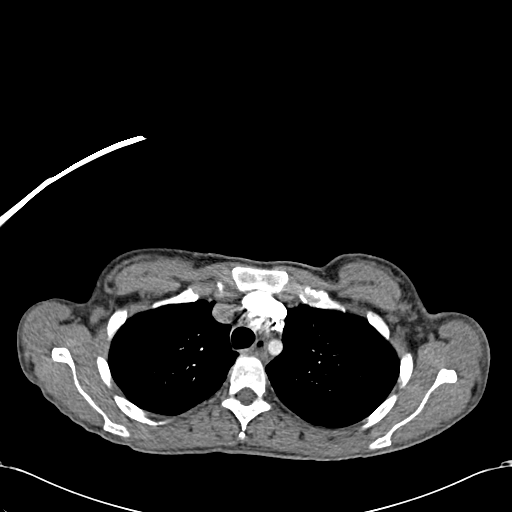
[im 222/278  lung]
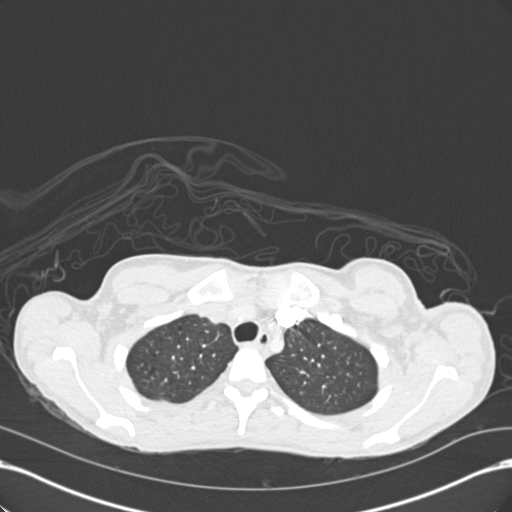
[im 236/278  mediastinal]
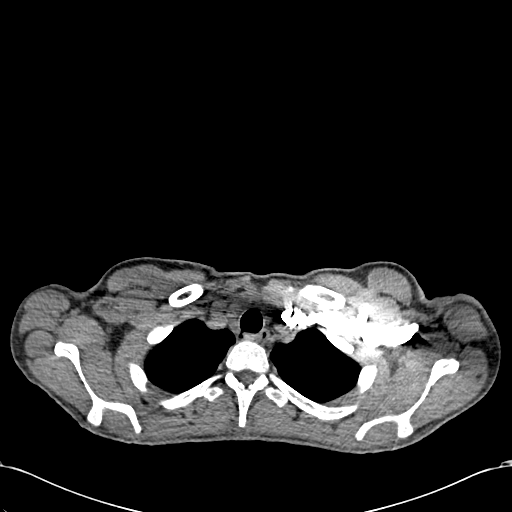
[im 250/278  lung]
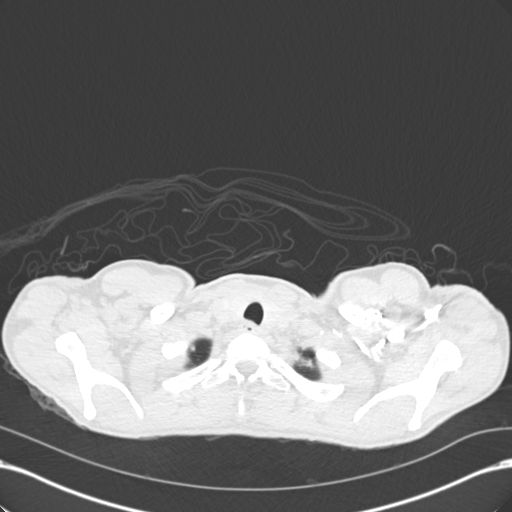
[im 264/278  mediastinal]
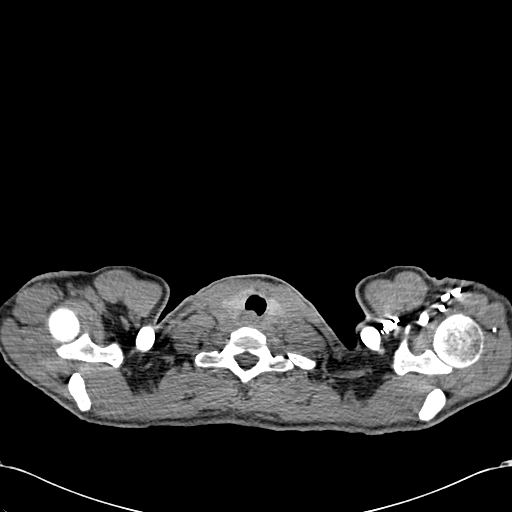

[Series 606: cor mpr · coronal · 0.70mm/px · 1 of 97 slices shown]
[im 49/97  mediastinal]
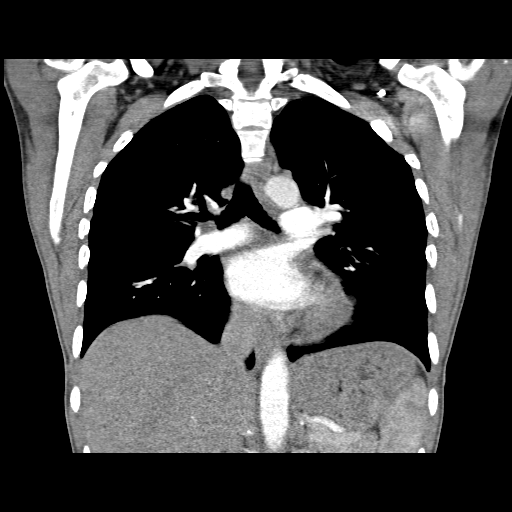

[19 of 36 positions shown; findings below may reference images not displayed]

FINDINGS: No filling defects within the pulmonary arteries to
suggest acute pulmonary embolism.  No acute findings aorta or great
vessels.  No pericardial fluid.

No evidence of axillary or supraclavicular lymphadenopathy.  No
mediastinal adenopathy.  There is mild hilar adenopathy.

Review of the lung windows demonstrate the central lobular
emphysema in the upper lobes.  There is ground-glass  opacity in
the right lower lobe which is new from prior.

The airways are normal.  There is a small gas collection adjacent
to the trachea in the medial right upper lobe (image #13) which
measures 9 mm and is present on multiple comparison exam likely
represents a congenital anomaly.

Limited view of the upper abdomen is unremarkable.

Limited view of the skeleton is unremarkable.

Review of the MIP images confirms the above findings.
IMPRESSION: 1.  No evidence of acute pulmonary embolism.
2.  Ground-glass opacity in the right lower lobe consistent with
infection or inflammation.

Of note, this is the patient's fifth CT pulmonary angiogram since
04/12/2009.

## 2012-08-05 ENCOUNTER — Encounter (HOSPITAL_COMMUNITY): Payer: Self-pay | Admitting: *Deleted

## 2012-08-05 ENCOUNTER — Emergency Department (HOSPITAL_COMMUNITY)
Admission: EM | Admit: 2012-08-05 | Discharge: 2012-08-05 | Disposition: A | Discharge status: Home / Self Care | Payer: Medicaid Other | Attending: Emergency Medicine | Admitting: Emergency Medicine

## 2012-08-05 DIAGNOSIS — F1410 Cocaine abuse, uncomplicated: Secondary | ICD-10-CM | POA: Insufficient documentation

## 2012-08-05 DIAGNOSIS — H9210 Otorrhea, unspecified ear: Secondary | ICD-10-CM | POA: Insufficient documentation

## 2012-08-05 DIAGNOSIS — F17200 Nicotine dependence, unspecified, uncomplicated: Secondary | ICD-10-CM | POA: Insufficient documentation

## 2012-08-05 DIAGNOSIS — I10 Essential (primary) hypertension: Secondary | ICD-10-CM | POA: Insufficient documentation

## 2012-08-05 DIAGNOSIS — Z79899 Other long term (current) drug therapy: Secondary | ICD-10-CM | POA: Insufficient documentation

## 2012-08-05 DIAGNOSIS — R21 Rash and other nonspecific skin eruption: Secondary | ICD-10-CM | POA: Insufficient documentation

## 2012-08-05 DIAGNOSIS — Z8659 Personal history of other mental and behavioral disorders: Secondary | ICD-10-CM | POA: Insufficient documentation

## 2012-08-05 DIAGNOSIS — I776 Arteritis, unspecified: Secondary | ICD-10-CM

## 2012-08-05 DIAGNOSIS — M069 Rheumatoid arthritis, unspecified: Secondary | ICD-10-CM | POA: Insufficient documentation

## 2012-08-05 HISTORY — DX: Unspecified osteoarthritis, unspecified site: M19.90

## 2012-08-05 LAB — CBC WITH DIFFERENTIAL/PLATELET
Basophils Absolute: 0 10*3/uL (ref 0.0–0.1)
HCT: 33.7 % — ABNORMAL LOW (ref 36.0–46.0)
Hemoglobin: 11.8 g/dL — ABNORMAL LOW (ref 12.0–15.0)
Lymphocytes Relative: 20 % (ref 12–46)
Monocytes Absolute: 0.3 10*3/uL (ref 0.1–1.0)
Monocytes Relative: 4 % (ref 3–12)
Neutro Abs: 5.1 10*3/uL (ref 1.7–7.7)
RBC: 3.85 MIL/uL — ABNORMAL LOW (ref 3.87–5.11)
RDW: 13.6 % (ref 11.5–15.5)
WBC: 6.8 10*3/uL (ref 4.0–10.5)

## 2012-08-05 LAB — BASIC METABOLIC PANEL
Chloride: 99 mEq/L (ref 96–112)
GFR calc Af Amer: >90 mL/min (ref >90)
Potassium: 3.7 mEq/L (ref 3.5–5.1)

## 2012-08-05 LAB — URINALYSIS, ROUTINE W REFLEX MICROSCOPIC
Bilirubin Urine: NEGATIVE
Nitrite: NEGATIVE
Specific Gravity, Urine: 1.021 (ref 1.005–1.030)
pH: 7 (ref 5.0–8.0)

## 2012-08-05 LAB — URINE MICROSCOPIC-ADD ON

## 2012-08-05 MED ORDER — HYDROCODONE-IBUPROFEN 7.5-200 MG PO TABS: 1.0000 | ORAL_TABLET | Freq: Four times a day (QID) | ORAL | Status: DC | PRN

## 2012-08-05 MED ORDER — HYDROMORPHONE HCL PF 1 MG/ML IJ SOLN
1.0000 mg | Freq: Once | INTRAMUSCULAR | Status: AC
  Administered 2012-08-05: 1 mg via INTRAVENOUS
  Filled 2012-08-05: qty 1

## 2012-08-05 MED ORDER — CEPHALEXIN 500 MG PO CAPS: 500.0000 mg | ORAL_CAPSULE | Freq: Four times a day (QID) | ORAL | Status: DC

## 2012-08-05 MED ORDER — ONDANSETRON 4 MG PO TBDP
4.0000 mg | ORAL_TABLET | Freq: Once | ORAL | Status: AC
  Administered 2012-08-05: 4 mg via ORAL
  Filled 2012-08-05: qty 1

## 2012-08-05 MED ORDER — PREDNISONE 20 MG PO TABS: ORAL_TABLET | ORAL | Status: DC

## 2012-08-05 MED ORDER — METHYLPREDNISOLONE SODIUM SUCC 125 MG IJ SOLR
125.0000 mg | Freq: Once | INTRAMUSCULAR | Status: AC
  Administered 2012-08-05: 125 mg via INTRAVENOUS
  Filled 2012-08-05: qty 2

## 2012-08-05 NOTE — ED Notes (Signed)
Pt c/o bil ear pain and burning. Pt was seen at [facility] today but per pt but no new interventions for her ears per pt.

## 2012-08-05 NOTE — ED Provider Notes (Signed)
History     CSN: [id number]  Arrival date & time 08/05/12  0602   First MD Initiated Contact with Patient 08/05/12 [phone]      Chief Complaint  Patient presents with  . Otalgia    (Consider location/radiation/quality/duration/timing/severity/associated sxs/prior treatment) Patient is a 51 y.o. female presenting with ear pain.  Otalgia Associated symptoms include ear discharge and rash. Pertinent negatives include no hearing loss, no abdominal pain, no diarrhea and no vomiting.  Patient is a 51 y.o. female presenting with ear pain.  Otalgia Associated symptoms include ear discharge and rash. Pertinent negatives include no hearing loss, no abdominal pain, no diarrhea and no vomiting.   51 year old female presents to the emergency department with chief complaint of bilateral ear pain.  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  She is a frequent emergency department visitor with chronic vasculitis and pain.  Patient states "my ears are acting up again".  She admits to still using cocaine occasionally.  She has history of polysubstance abuse.  Patient states that she has run out of Vicodin at home which she uses on a daily basis for pain.  Patient states that she just finished a round of antibiotics, but she is unsure which abx she took.  Patient is crying loudly and askingfor pain medication. Last night ears began swelling and draining.  Pain is external. Similar to past instances.  Denies fevers, chills, myalgias, arthralgias, nausea, vomiting, diarrhea. Denies DOE, SOB, chest tightness or pressure, radiation to left arm, jaw or back, or diaphoresis. Denies dysuria, flank pain, suprapubic pain, frequency, urgency, or hematuria. Denies headaches, light headedness, weakness, visual disturbances.   Past Medical History  Diagnosis Date  . 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 Followed by Dr. Louanne Skye  . Hypertension   . Cocaine  abuse     ongoing with resultant vaculitis.  . Tobacco abuse   . Depression   . Normocytic anemia     BL Hgb 9.8-12. Last anemia panel 04/2010 - showing Fe 19, ferritin 101.  Pt on monthly B12 injections  . Rheumatoid arthritis     patient reported  . Inflammatory arthritis     Past Surgical History  Procedure Date  . Skin biopsy     bilateral shin nodules on 04/2010  . Hernia repair     Family History  Problem Relation Age of Onset  . Breast cancer Mother     Breast cancer  . Alcohol abuse Mother   . Colon cancer Maternal Aunt 54  . Alcohol abuse Father     History  Substance Use Topics  . Smoking status: Current Every Day Smoker -- 0.2 packs/day for 39 years    Types: Cigarettes  . Smokeless tobacco: Never Used   Comment: trying  . Alcohol Use: No    OB History    Grav Para Term Preterm Abortions TAB SAB Ect Mult Living                  Review of Systems  Constitutional: Negative.  Negative for fever and chills.  HENT: Positive for ear pain and ear discharge. Negative for hearing loss, trouble swallowing and tinnitus.   Eyes: Negative.   Respiratory: Negative.  Negative for shortness of breath.   Cardiovascular: Negative for chest pain.  Gastrointestinal: Negative.  Negative for nausea, vomiting, abdominal pain, diarrhea and constipation.  Genitourinary: Negative.  Negative for dysuria and hematuria.  Musculoskeletal: Negative.  Negative for myalgias and arthralgias.  Skin: Positive for rash and wound.  Neurological: Negative.  Negative for numbness.  Psychiatric/Behavioral: Negative.   All other systems reviewed and are negative.    Allergies  Acetaminophen  Home Medications   Current Outpatient Rx  Name Route Sig Dispense Refill  . AMLODIPINE BESYLATE 2.5 MG PO TABS Oral Take 1 tablet (2.5 mg total) by mouth daily. 30 tablet 5  . ASPIRIN 81 MG PO TBEC Oral Take 1 tablet (81 mg total) by mouth daily. 30 tablet 3  . DIPHENHYDRAMINE HCL 25 MG PO CAPS  Oral Take 25 mg by mouth every 6 (six) hours as needed. itching     . HYDROCODONE-IBUPROFEN 5-200 MG PO TABS Oral Take 1 tablet by mouth every 8 (eight) hours as needed for pain. 10 tablet 0  . LISINOPRIL 20 MG PO TABS Oral Take 20 mg by mouth daily. For blood pressure     . ADULT MULTIVITAMIN W/MINERALS CH Oral Take 1 tablet by mouth daily.    Marland Kitchen MUPIROCIN 2 % EX OINT Topical Apply topically 3 (three) times daily.    . TRAMADOL HCL 50 MG PO TABS Oral Take 1 tablet (50 mg total) by mouth every 6 (six) hours as needed. For pain 30 tablet 0  . VITAMIN B-12 1000 MCG PO TABS Oral Take 1,000 mcg by mouth daily.      Marland Kitchen ZOLPIDEM TARTRATE 10 MG PO TABS Oral Take 1 tablet (10 mg total) by mouth at bedtime. 30 tablet 0  . FLUOXETINE HCL 20 MG PO CAPS Oral Take 40 mg by mouth daily.      BP 159/89  Pulse 101  Temp 98.3 F (36.8 C) (Oral)  Resp 26  SpO2 100%  Physical Exam  Constitutional: She is oriented to person, place, and time. She appears well-developed and well-nourished. No distress.  HENT:  Head: Normocephalic and atraumatic.       Multiple, violaceous, erythematous bullae with serous and bloody discharge.  Erythematous and violaceous plaques and crusts across the malar area and tip of the nose.   Large  3x4cm violaceous plaque on the posterior arm. 1 2x2cm violaceous plaque on the left foream  Eyes: Conjunctivae normal are normal. No scleral icterus.  Neck: Normal range of motion.  Cardiovascular: Normal rate, regular rhythm and normal heart sounds.  Exam reveals no gallop and no friction rub.   No murmur heard. Pulmonary/Chest: Effort normal and breath sounds normal. No respiratory distress.  Abdominal: Soft. Bowel sounds are normal. She exhibits no distension and no mass. There is no tenderness. There is no guarding.  Neurological: She is alert and oriented to person, place, and time.  Skin: Skin is warm and dry. She is not diaphoretic.    ED Course  Procedures (including critical  care time)  Labs Reviewed  CBC WITH DIFFERENTIAL - Abnormal; Notable for the following:    RBC 3.85 (*)     Hemoglobin 11.8 (*)     HCT 33.7 (*)     All other components within normal limits  URINALYSIS, ROUTINE W REFLEX MICROSCOPIC - Abnormal; Notable for the following:    APPearance CLOUDY (*)     Hgb urine dipstick TRACE (*)     Leukocytes, UA LARGE (*)     All other components within normal limits  URINE MICROSCOPIC-ADD ON - Abnormal; Notable for the following:    Bacteria, UA MANY (*)     All other components within normal limits  BASIC METABOLIC  PANEL  URINE CULTURE   No results found.   No diagnosis found.    MDM  8:26 AM BP 153/83  Pulse 92  Temp 98.5 F (36.9 C) (Oral)  Resp 18  SpO2 98% Patient with very limited insight.  Pain is somewhat reduced after admin of dilaudid. SHe is no longer yelling and crying.   Patient pain under control with 2 rounds of 51m dilaudid given within 1 hour.  I have also given 125 IV solumedrol.  Patient is resting comfortably and  expresses her gratitude.  There are no signs of infection.  UA is suspicious for UTI.  I will discharge with 2 week prednisone taper and Keflex for UTI.  Patient understands and agrees with plan. She will [facility] pcp [facility] Discussed reasons to seek immediate care. Patient expresses understanding and agrees with plan. Filed Vitals:   08/05/12 0604 08/05/12 0756 08/05/12 1118  BP: 159/89 153/83 133/73  Pulse: 101 92 74  Temp: 98.3 F (36.8 C) 98.5 F (36.9 C) 98.3 F (36.8 C)  TempSrc: Oral Oral Oral  Resp: 26 18 18   SpO2: 100% 98% 96%           AMargarita Mail PA-C 08/07/12 1229

## 2012-08-05 NOTE — ED Notes (Signed)
Attempted to call [facility] line is busy

## 2012-08-05 NOTE — ED Notes (Signed)
FUW:[id number]<CE> Expected date:08/05/12<BR> Expected time: 5:49 AM<BR> Means of arrival:Ambulance<BR> Comments:<BR> Ears burning

## 2012-08-05 NOTE — ED Notes (Signed)
Pt belongings are at desk.   Jewelry is in small bag and labeled.   Cellphone and extra glasses in personal belongings bag.   Security called for patient.

## 2012-08-06 LAB — URINE CULTURE: Colony Count: 45000

## 2012-08-07 IMAGING — CR DG CHEST 2V
2 series · 2 of 2 positions shown · non-contrast
Comparison: 08/01/2010

CLINICAL DATA: Generalized pain, weakness

CHEST - 2 VIEW

[w chest lat]
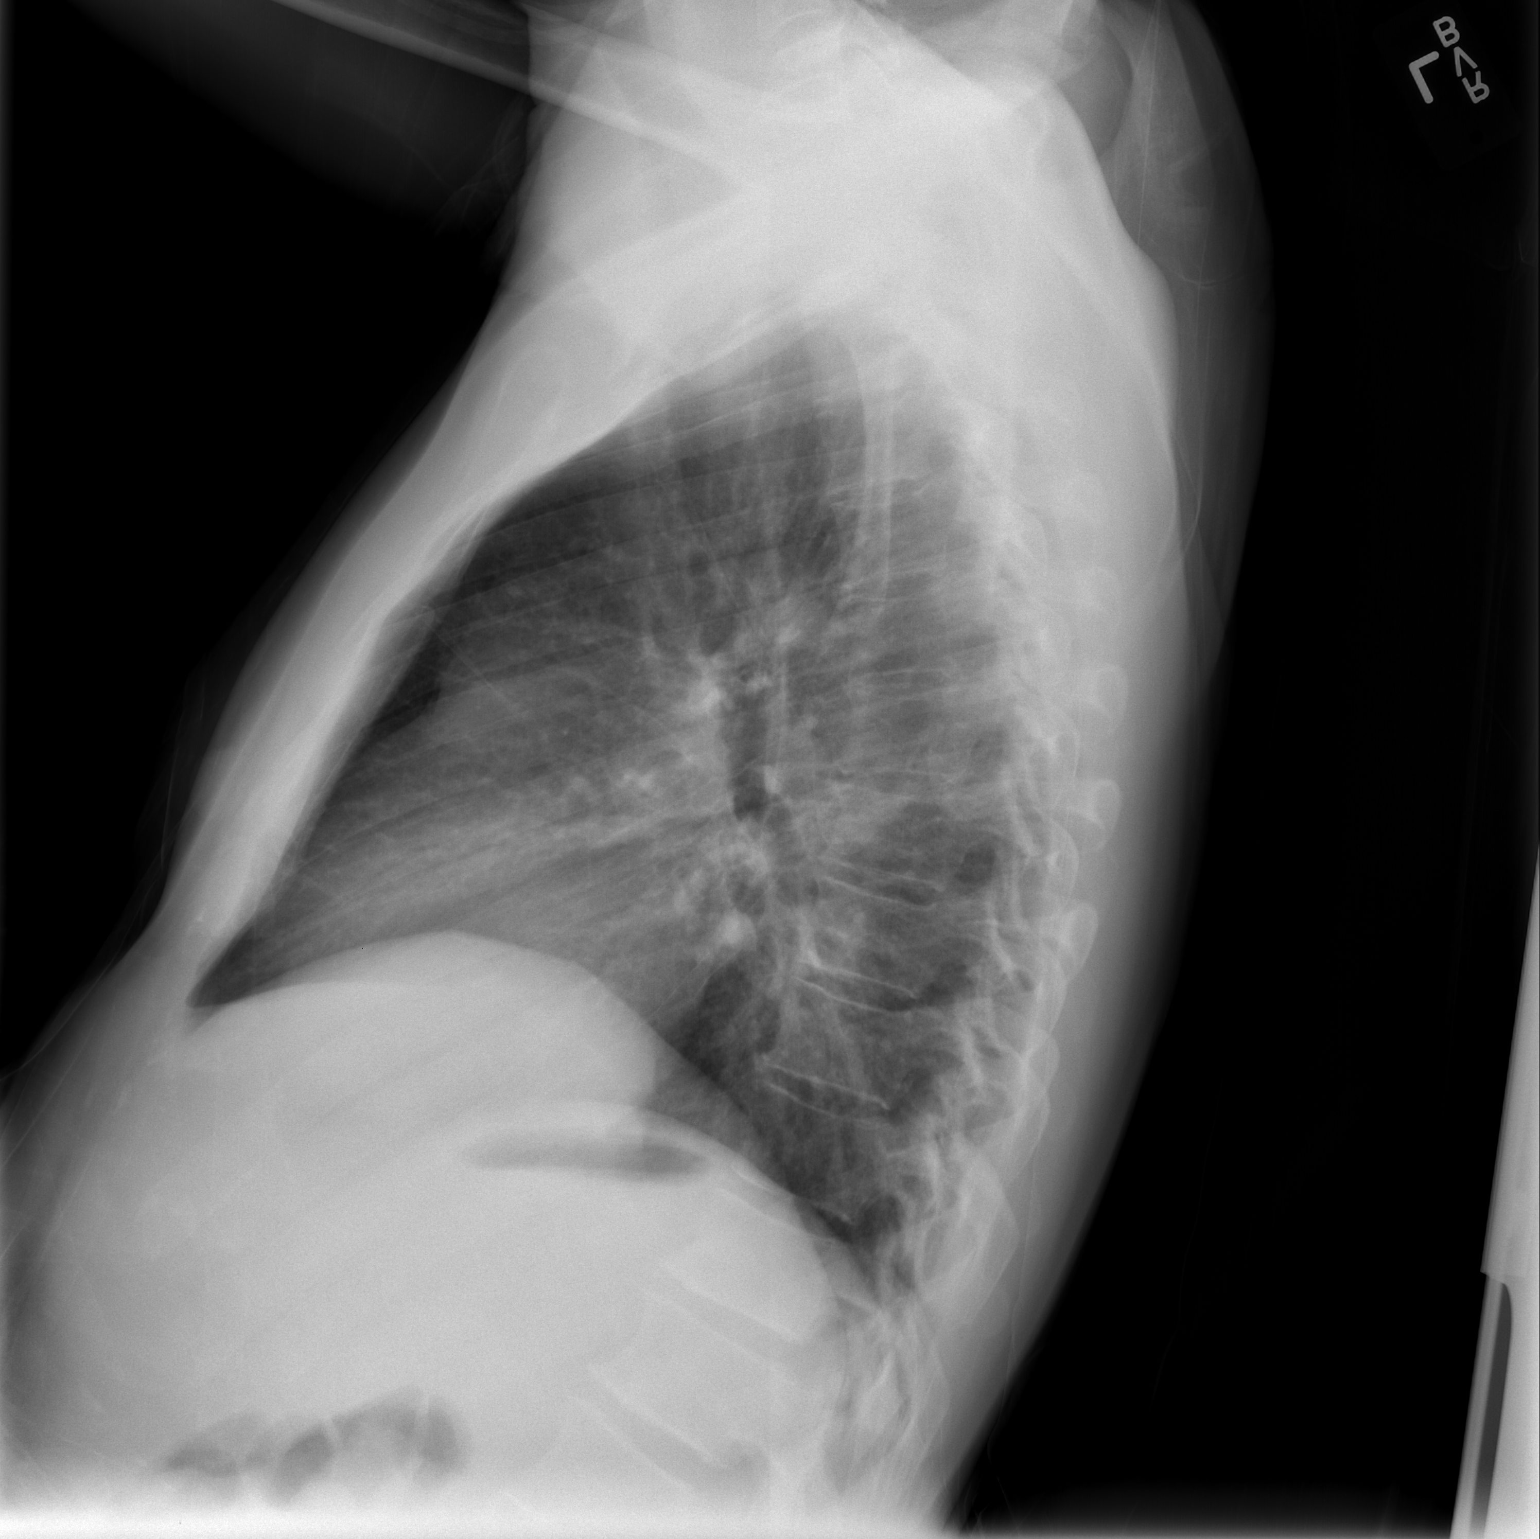

[w chest pa]
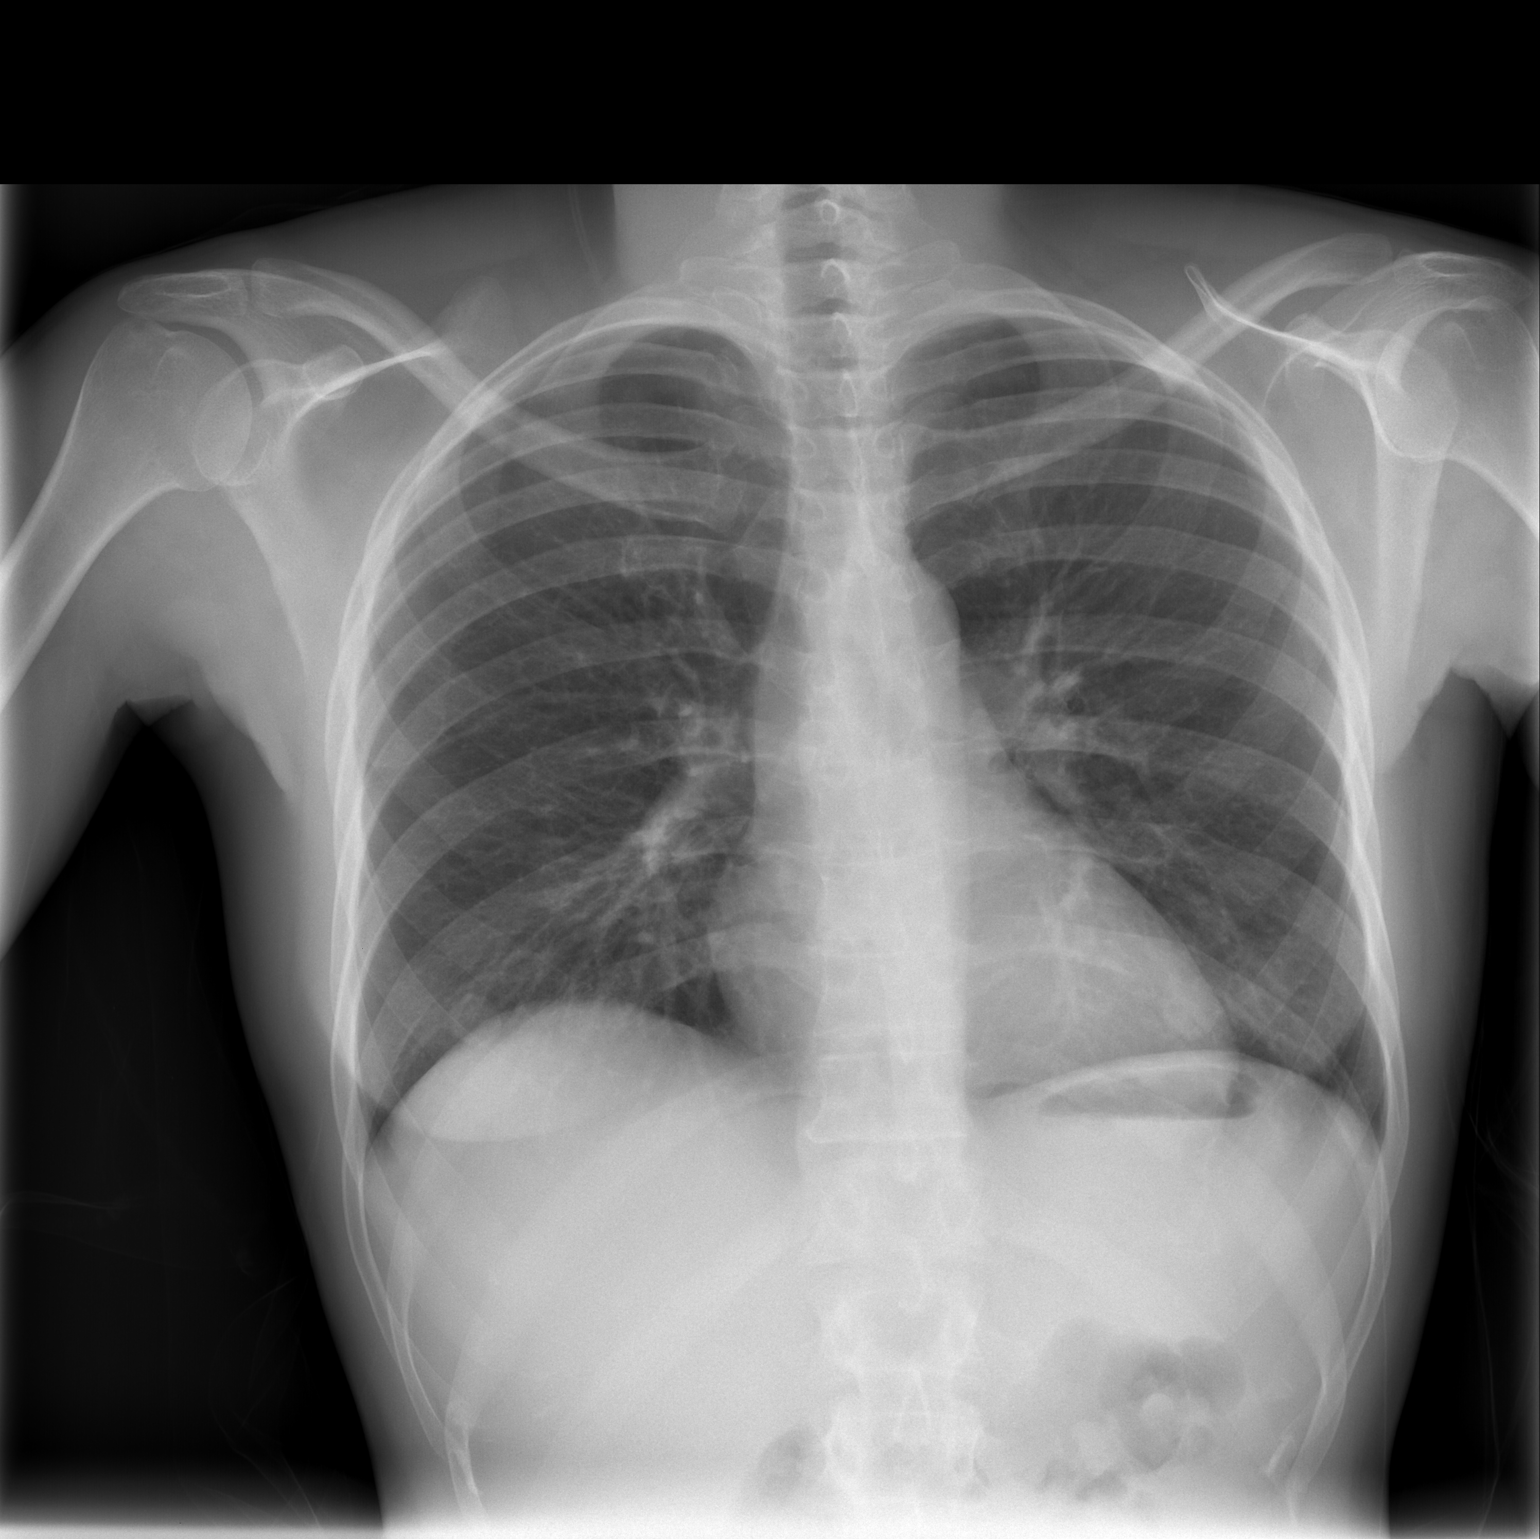

[2 of 2 positions shown; findings below may reference images not displayed]

FINDINGS: Normal heart size, mediastinal contours, and pulmonary vascularity.
Minimal chronic peribronchial thickening without infiltrate or
effusion.
Bilateral nipple shadows again noted.
No pneumothorax.
Osseous structures unremarkable.
IMPRESSION: No acute abnormalities.

## 2012-08-07 NOTE — ED Notes (Signed)
+  Urine. Patient given Keflex. No sensitivities listed. Chart sent to [place] office for review.

## 2012-08-07 NOTE — ED Provider Notes (Signed)
Medical screening examination/treatment/procedure(s) were performed by non-physician practitioner and as supervising physician I was immediately available for consultation/collaboration.    Johnna Acosta, MD 08/07/12 1800

## 2012-08-08 NOTE — ED Notes (Signed)
Likely contaminant per [facility]

## 2012-08-12 IMAGING — CR DG CHEST 2V
2 series · 2 of 2 positions shown · non-contrast
Comparison: 08/24/2010

CLINICAL DATA: Chest pain, hypertension, smoker.

CHEST - 2 VIEW

[w chest pa]
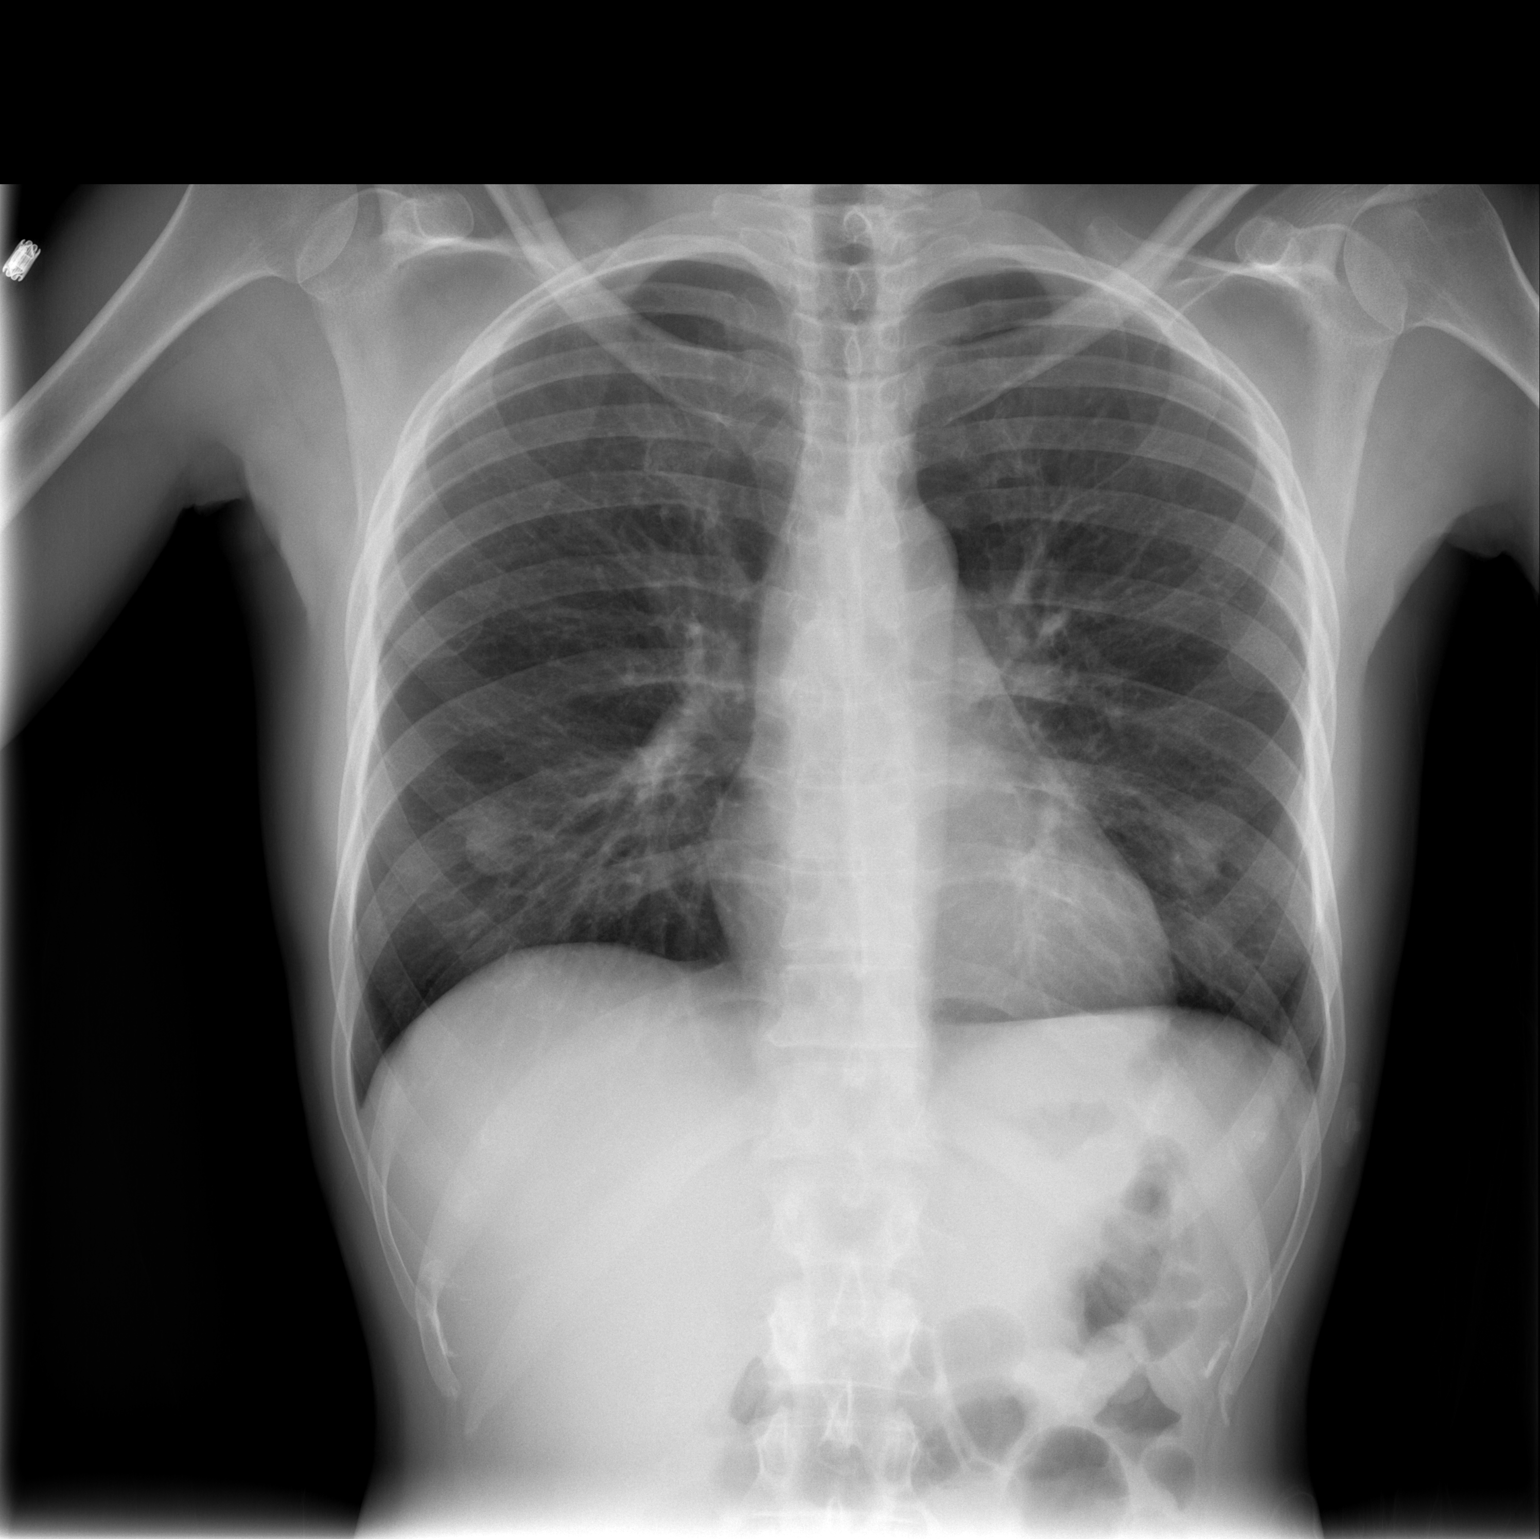

[w chest lat]
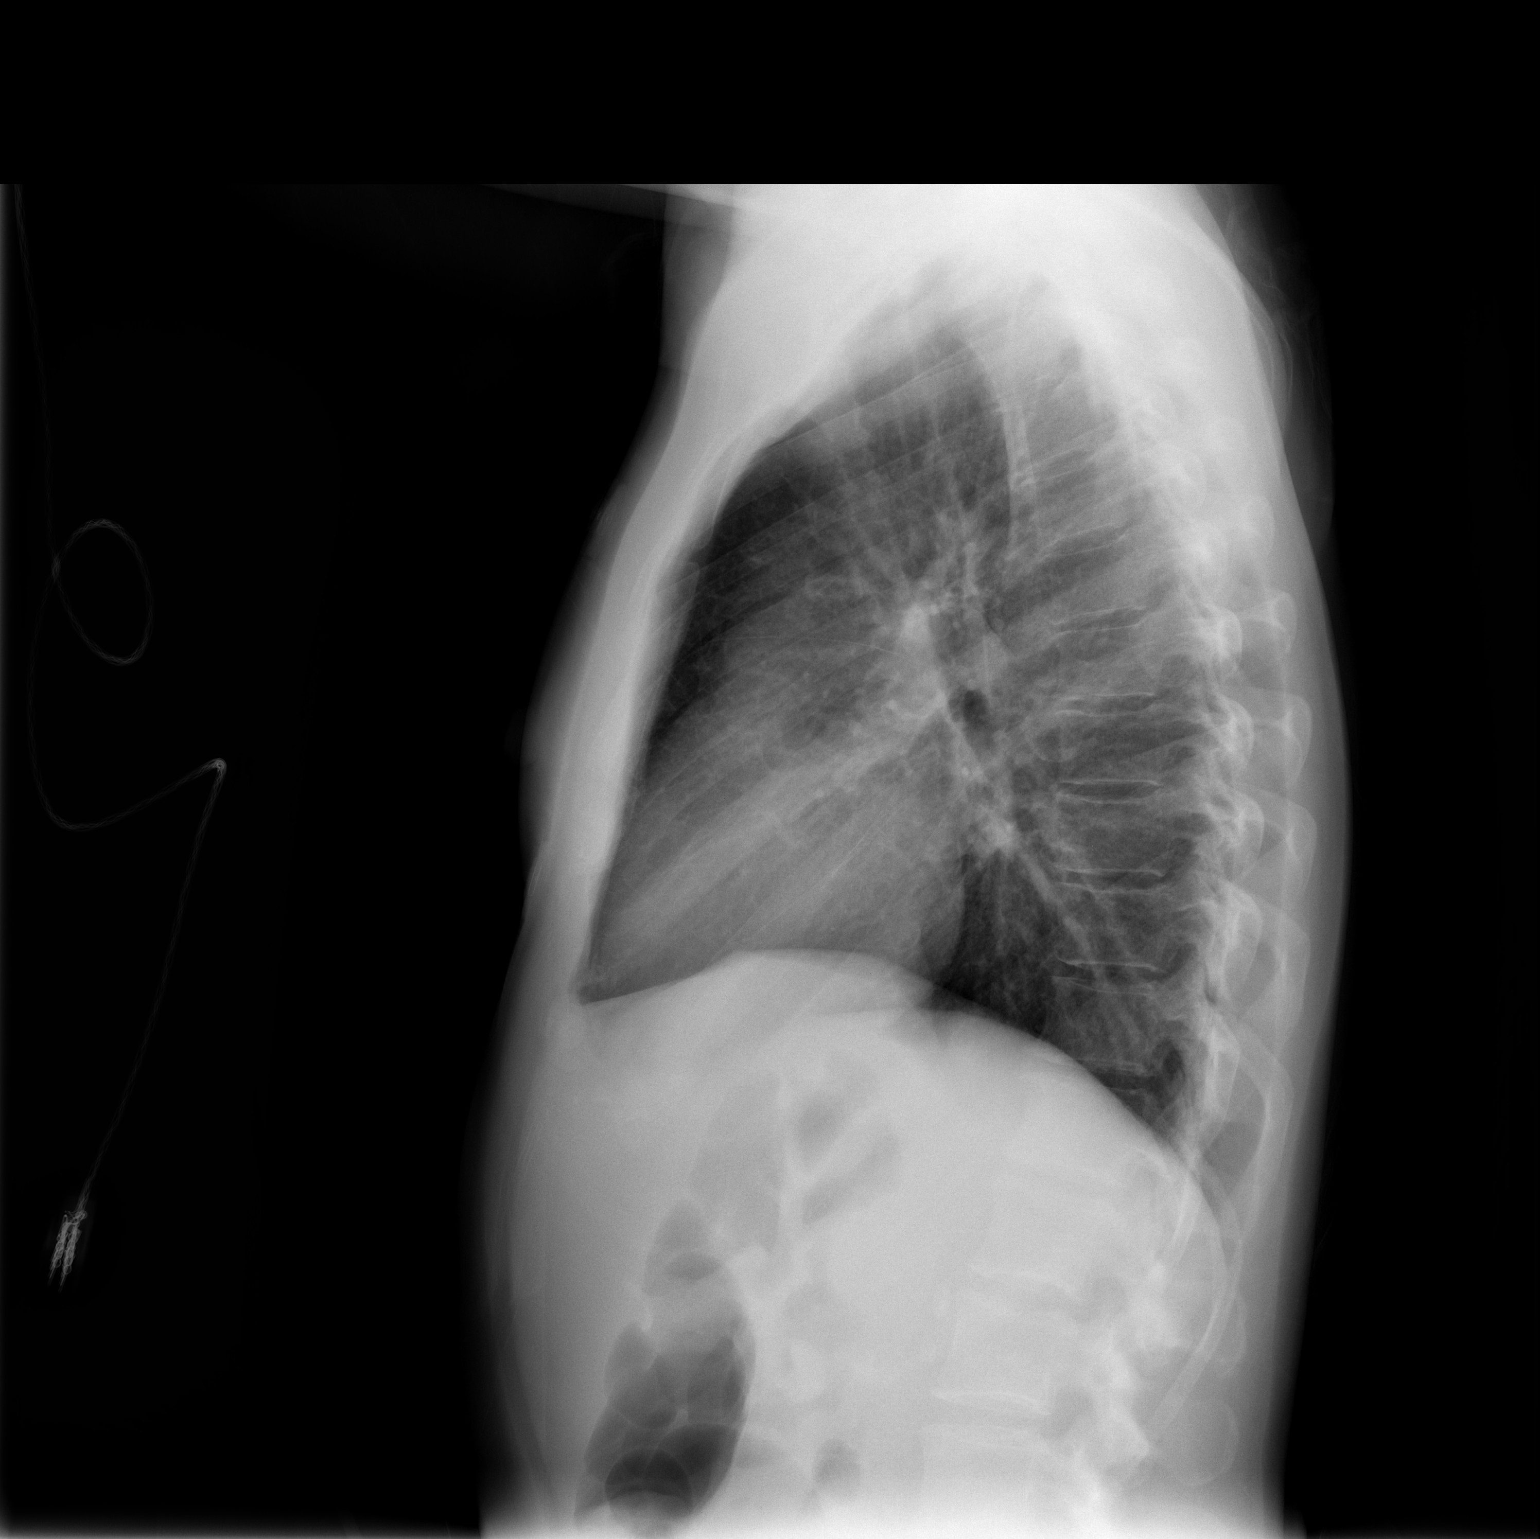

[2 of 2 positions shown; findings below may reference images not displayed]

FINDINGS: Slight peribronchial thickening. Heart and mediastinal
contours are within normal limits.  No focal opacities or
effusions.  No acute bony abnormality.
IMPRESSION: Bronchitic changes.

## 2012-08-21 ENCOUNTER — Emergency Department (HOSPITAL_COMMUNITY)
Admission: EM | Admit: 2012-08-21 | Discharge: 2012-08-21 | Disposition: A | Discharge status: Home / Self Care | Payer: Medicaid Other | Attending: Emergency Medicine | Admitting: Emergency Medicine

## 2012-08-21 ENCOUNTER — Encounter (HOSPITAL_COMMUNITY): Payer: Self-pay | Admitting: *Deleted

## 2012-08-21 DIAGNOSIS — I10 Essential (primary) hypertension: Secondary | ICD-10-CM | POA: Insufficient documentation

## 2012-08-21 DIAGNOSIS — Z8739 Personal history of other diseases of the musculoskeletal system and connective tissue: Secondary | ICD-10-CM | POA: Insufficient documentation

## 2012-08-21 DIAGNOSIS — F3289 Other specified depressive episodes: Secondary | ICD-10-CM | POA: Insufficient documentation

## 2012-08-21 DIAGNOSIS — Z7982 Long term (current) use of aspirin: Secondary | ICD-10-CM | POA: Insufficient documentation

## 2012-08-21 DIAGNOSIS — Z79899 Other long term (current) drug therapy: Secondary | ICD-10-CM | POA: Insufficient documentation

## 2012-08-21 DIAGNOSIS — F329 Major depressive disorder, single episode, unspecified: Secondary | ICD-10-CM | POA: Insufficient documentation

## 2012-08-21 DIAGNOSIS — H9209 Otalgia, unspecified ear: Secondary | ICD-10-CM | POA: Insufficient documentation

## 2012-08-21 DIAGNOSIS — F1410 Cocaine abuse, uncomplicated: Secondary | ICD-10-CM | POA: Insufficient documentation

## 2012-08-21 DIAGNOSIS — F17200 Nicotine dependence, unspecified, uncomplicated: Secondary | ICD-10-CM | POA: Insufficient documentation

## 2012-08-21 DIAGNOSIS — I776 Arteritis, unspecified: Secondary | ICD-10-CM | POA: Insufficient documentation

## 2012-08-21 LAB — CBC WITH DIFFERENTIAL/PLATELET
Basophils Absolute: 0 10*3/uL (ref 0.0–0.1)
Basophils Relative: 0 % (ref 0–1)
Eosinophils Absolute: 0 10*3/uL (ref 0.0–0.7)
Eosinophils Relative: 1 % (ref 0–5)
HCT: 33.7 % — ABNORMAL LOW (ref 36.0–46.0)
MCH: 30.2 pg (ref 26.0–34.0)
MCHC: 35.3 g/dL (ref 30.0–36.0)
MCV: 85.5 fL (ref 78.0–100.0)
Monocytes Absolute: 0.2 10*3/uL (ref 0.1–1.0)
RDW: 13.2 % (ref 11.5–15.5)

## 2012-08-21 LAB — CREATININE, SERUM: Creatinine, Ser: 1.17 mg/dL — ABNORMAL HIGH (ref 0.50–1.10)

## 2012-08-21 LAB — BASIC METABOLIC PANEL
CO2: 24 mEq/L (ref 19–32)
Calcium: 9.1 mg/dL (ref 8.4–10.5)
Creatinine, Ser: 1.34 mg/dL — ABNORMAL HIGH (ref 0.50–1.10)
GFR calc Af Amer: 53 mL/min — ABNORMAL LOW (ref >90)

## 2012-08-21 MED ORDER — SODIUM CHLORIDE 0.9 % IV BOLUS (SEPSIS): 1000.0000 mL | Freq: Once | INTRAVENOUS | Status: DC

## 2012-08-21 MED ORDER — ONDANSETRON HCL 4 MG/2ML IJ SOLN
4.0000 mg | Freq: Once | INTRAMUSCULAR | Status: AC
  Administered 2012-08-21: 4 mg via INTRAVENOUS
  Filled 2012-08-21: qty 2

## 2012-08-21 MED ORDER — HYDROMORPHONE HCL PF 1 MG/ML IJ SOLN
1.0000 mg | Freq: Once | INTRAMUSCULAR | Status: AC
  Administered 2012-08-21: 1 mg via INTRAVENOUS
  Filled 2012-08-21: qty 1

## 2012-08-21 MED ORDER — HYDROMORPHONE HCL PF 2 MG/ML IJ SOLN
2.0000 mg | INTRAMUSCULAR | Status: DC | PRN
  Administered 2012-08-21: 2 mg via INTRAVENOUS
  Filled 2012-08-21: qty 1

## 2012-08-21 MED ORDER — SODIUM CHLORIDE 0.9 % IV BOLUS (SEPSIS)
1000.0000 mL | Freq: Once | INTRAVENOUS | Status: AC
  Administered 2012-08-21: 1000 mL via INTRAVENOUS

## 2012-08-21 MED ORDER — HYDROCODONE-IBUPROFEN 7.5-200 MG PO TABS: 1.0000 | ORAL_TABLET | Freq: Four times a day (QID) | ORAL | Status: DC

## 2012-08-21 NOTE — ED Provider Notes (Signed)
  Physical Exam  BP 98/65  Pulse 86  Temp 99.4 F (37.4 C) (Oral)  Resp 15  SpO2 97%  Physical Exam Check creatitine post IV hydration  ED Course  Procedures  MDM Improved       Garald Balding, NP 08/21/12 2201

## 2012-08-21 NOTE — ED Notes (Addendum)
Per ems pt is from [facility], alert and oriented x4, ambulatory with assistance. ems reports pt c/o of right sided abdominal pain episodes vomiting and diarrhea. 8/10.  Denies nausea. Bloody nose (pt has been blowing nose continuously). Ear pain, both covered with gauze, on left ear ems reported was all black.   Pt starts screaming in pain when ems tried to touch or assess pt. No bruising noted to abdomen, light palpation was "unbearable". Pt was not communicating with ems.   Upon rn assessment pt reports she smoked cocaine last night. Reports all complaints started last night. Reports ears started turning black a few days ago.

## 2012-08-21 NOTE — ED Provider Notes (Signed)
History     CSN: [id number]  Arrival date & time 08/21/12  1609   First MD Initiated Contact with Patient 08/21/12 1658      Chief Complaint  Patient presents with  . Abdominal Pain  . Otalgia    (Consider location/radiation/quality/duration/timing/severity/associated sxs/prior treatment) HPI  [name] is a 51 y.o. female with cronic pain secdary to a vasculaitis from Crack cocciane use. Pt reports exacerbation of bilateral ear pain that is typical for her. She is also c/o She also reports 6x episodes NBNB emesis yesterday, now tolerating PO. Pt also smoked Crack yesterday. Pt reports subjective fever, denies CP palaitations. Pt ran out of her vicoprofen this AM.   Past Medical History  Diagnosis Date  . 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 Followed by Dr. Louanne Skye  . Hypertension   . Cocaine abuse     ongoing with resultant vaculitis.  . Tobacco abuse   . Depression   . Normocytic anemia     BL Hgb 9.8-12. Last anemia panel 04/2010 - showing Fe 19, ferritin 101.  Pt on monthly B12 injections  . Rheumatoid arthritis     patient reported  . Inflammatory arthritis     Past Surgical History  Procedure Date  . Skin biopsy     bilateral shin nodules on 04/2010  . Hernia repair     Family History  Problem Relation Age of Onset  . Breast cancer Mother     Breast cancer  . Alcohol abuse Mother   . Colon cancer Maternal Aunt 72  . Alcohol abuse Father     History  Substance Use Topics  . Smoking status: Current Every Day Smoker -- 0.2 packs/day for 39 years    Types: Cigarettes  . Smokeless tobacco: Never Used     Comment: trying  . Alcohol Use: No    OB History    Grav Para Term Preterm Abortions TAB SAB Ect Mult Living                  Review of Systems  Constitutional: Negative for fever.  Respiratory: Negative for shortness of breath.   Cardiovascular: Negative for chest pain.  Gastrointestinal: Positive for abdominal pain. Negative for nausea,  vomiting and diarrhea.  All other systems reviewed and are negative.    Allergies  Acetaminophen  Home Medications   Current Outpatient Rx  Name  Route  Sig  Dispense  Refill  . AMLODIPINE BESYLATE 2.5 MG PO TABS   Oral   Take 1 tablet (2.5 mg total) by mouth daily.   30 tablet   5   . ASPIRIN 81 MG PO TBEC   Oral   Take 1 tablet (81 mg total) by mouth daily.   30 tablet   3   . DIPHENHYDRAMINE HCL 25 MG PO CAPS   Oral   Take 25 mg by mouth every 6 (six) hours as needed. itching          . FLUOXETINE HCL 20 MG PO CAPS   Oral   Take 40 mg by mouth daily.         Marland Kitchen HYDROCODONE-IBUPROFEN 5-200 MG PO TABS   Oral   Take 1 tablet by mouth every 8 (eight) hours as needed for pain.   10 tablet   0   . HYDROCODONE-IBUPROFEN 7.5-200 MG PO TABS   Oral   Take 1 tablet by mouth every 6 (six) hours as needed for pain.   10  tablet   0   . HYDROXYCHLOROQUINE SULFATE 200 MG PO TABS   Oral   Take 200 mg by mouth 2 (two) times daily.         Marland Kitchen LISINOPRIL 20 MG PO TABS   Oral   Take 20 mg by mouth daily. For blood pressure          . ADULT MULTIVITAMIN W/MINERALS CH   Oral   Take 1 tablet by mouth daily.         Marland Kitchen MUPIROCIN 2 % EX OINT   Topical   Apply topically 3 (three) times daily.         . TRAMADOL HCL 50 MG PO TABS   Oral   Take 1 tablet (50 mg total) by mouth every 6 (six) hours as needed. For pain   30 tablet   0   . VITAMIN B-12 1000 MCG PO TABS   Oral   Take 1,000 mcg by mouth daily.           Marland Kitchen ZOLPIDEM TARTRATE 10 MG PO TABS   Oral   Take 1 tablet (10 mg total) by mouth at bedtime.   30 tablet   0   . PREDNISONE 20 MG PO TABS      Take 3 pills for the first 4 days. Take 2 pills for the next 4 days. Take 1 pill for the last 4 days.   24 tablet   0     BP 149/85  Pulse 106  Temp 99.4 F (37.4 C) (Oral)  Resp 20  SpO2 100%  Physical Exam  Nursing note and vitals reviewed. Constitutional: She is oriented to person,  place, and time. She appears well-developed and well-nourished. No distress.       Crying moaning  HENT:  Head: Normocephalic.  Eyes: Conjunctivae normal and EOM are normal. Pupils are equal, round, and reactive to light.  Cardiovascular: Normal rate.   Pulmonary/Chest: Effort normal and breath sounds normal. No stridor. No respiratory distress. She has no wheezes. She has no rales. She exhibits no tenderness.  Abdominal: Soft. Bowel sounds are normal. She exhibits no distension and no mass. There is tenderness. There is no rebound and no guarding.       Diffusely tender to palpation with no rebound or peritoneal signs.  Musculoskeletal: Normal range of motion.  Neurological: She is alert and oriented to person, place, and time.  Skin:       Gangrenous ulcerations to bilateral auricles and violaceous hyperpigmentation to nose.   No warmth, erythema, induration or any other indications of infection.  Psychiatric: She has a normal mood and affect.    ED Course  Procedures (including critical care time)  Labs Reviewed  CBC WITH DIFFERENTIAL - Abnormal; Notable for the following:    Hemoglobin 11.9 (*)     HCT 33.7 (*)     All other components within normal limits  BASIC METABOLIC PANEL - Abnormal; Notable for the following:    Sodium 132 (*)     Chloride 95 (*)     Glucose, Bld 105 (*)     BUN 28 (*)     Creatinine, Ser 1.34 (*)     GFR calc non Af Amer 46 (*)     GFR calc Af Amer 53 (*)     All other components within normal limits  CREATININE, SERUM   No results found.   No diagnosis found.    MDM  Patient is afebrile  with stable vital signs. It appears that her vasculitis is stable. I will order basic blood work and to control her pain.  Pt's CR is 1.34, I will bolus her N  Patient seen and reexamined at the bedside. She is resting comfortably no longer screaming and moaning. She still rates her pain a 9/10 but is smiling and appears very comfortable. Repeat abdominal  exam shows no tenderness to palpation and no rebound or peritoneal signs.  Patient has a slight bump in her creatinine to 1.34. Discussed with attending Dr. Melanee Left who feels a recheck after hydration is appropriate.  Case signed out to NP Delena Bali at shift change.       Monico Blitz, PA-C 08/21/12 2114

## 2012-08-22 NOTE — ED Provider Notes (Signed)
Medical screening examination/treatment/procedure(s) were performed by non-physician practitioner and as supervising physician I was immediately available for consultation/collaboration.  Ezequiel Essex, MD 08/22/12 1159

## 2012-08-22 NOTE — ED Provider Notes (Signed)
Medical screening examination/treatment/procedure(s) were performed by non-physician practitioner and as supervising physician I was immediately available for consultation/collaboration.   Ezequiel Essex, MD 08/22/12 1200

## 2012-08-24 ENCOUNTER — Other Ambulatory Visit: Payer: Self-pay | Admitting: *Deleted

## 2012-08-24 MED ORDER — ZOLPIDEM TARTRATE 10 MG PO TABS: 10.0000 mg | ORAL_TABLET | Freq: Every evening | ORAL | Status: DC | PRN

## 2012-08-24 NOTE — Telephone Encounter (Signed)
Zolpidem rx called to [facility].

## 2012-08-29 IMAGING — CT CT NECK W/ CM
3 of 4 series · 16 of 33 positions shown, 19 images · IV contrast (APPLIED)
Comparison: 06/08/2009.

CLINICAL DATA: MRSA skin infection

CT NECK WITH CONTRAST
TECHNIQUE: Multidetector CT imaging of the neck was performed with
intravenous contrast.
Contrast: 75 ml Wmnipaque-WII.

[Series 3: st neck 2.0 b31s · axial · 0.40mm/px · z∈[-361,-175]mm · 8 of 121 slices shown, 10 images]
[im 14/121  soft-tissue]
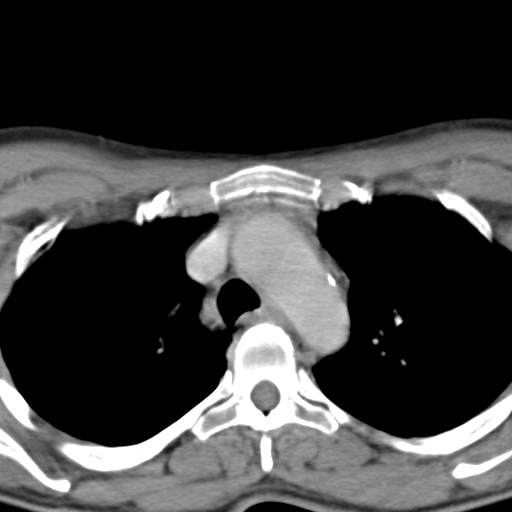
[im 14/121  bone]
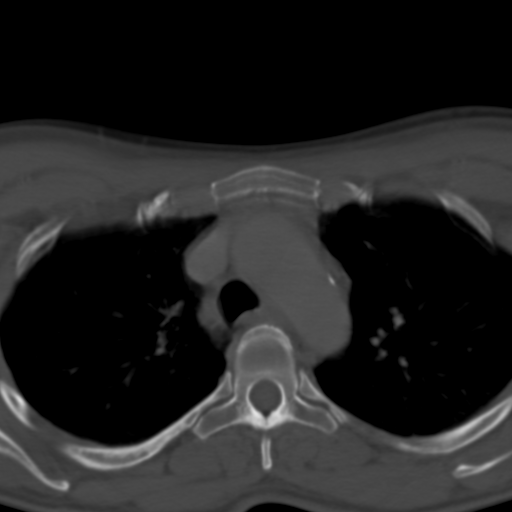
[im 27/121  bone]
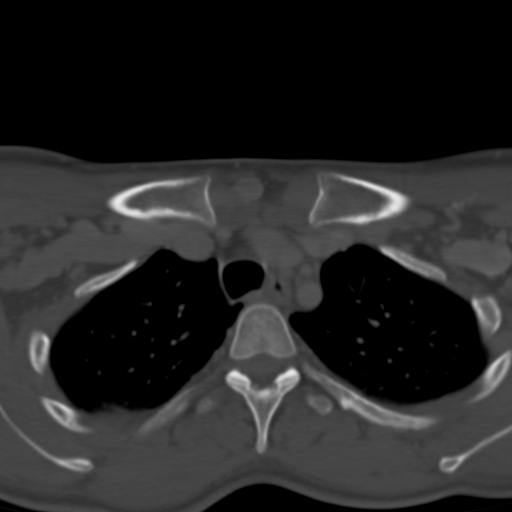
[im 41/121  bone]
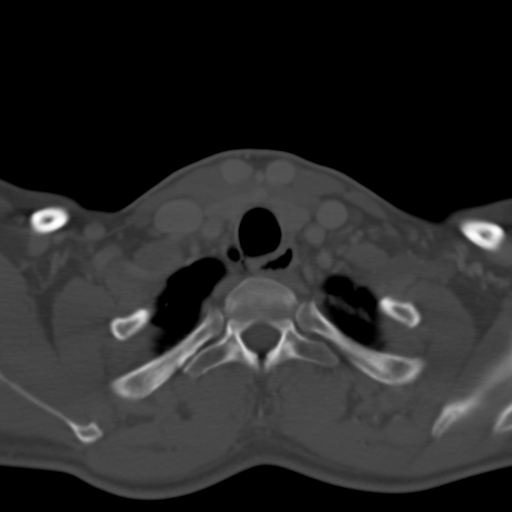
[im 54/121  bone]
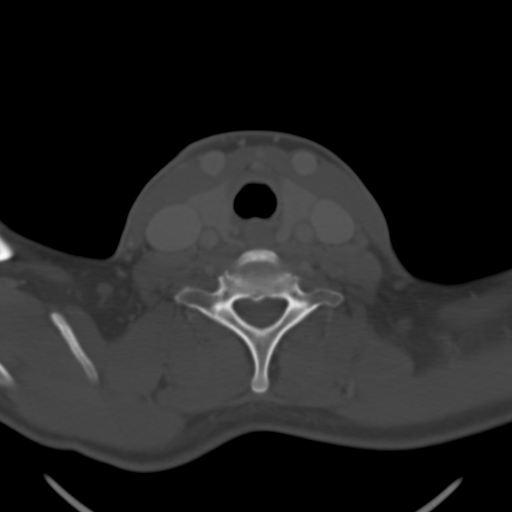
[im 67/121  soft-tissue]
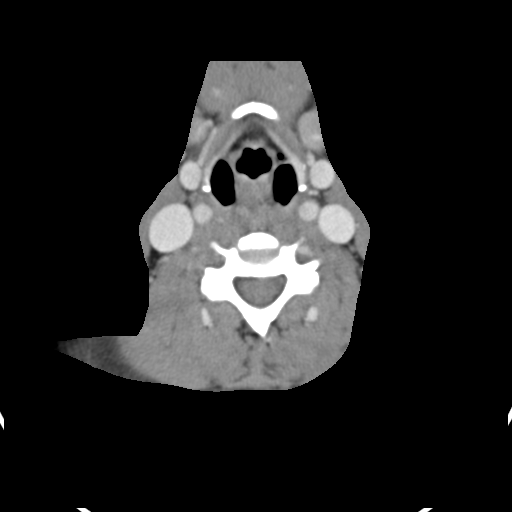
[im 67/121  bone]
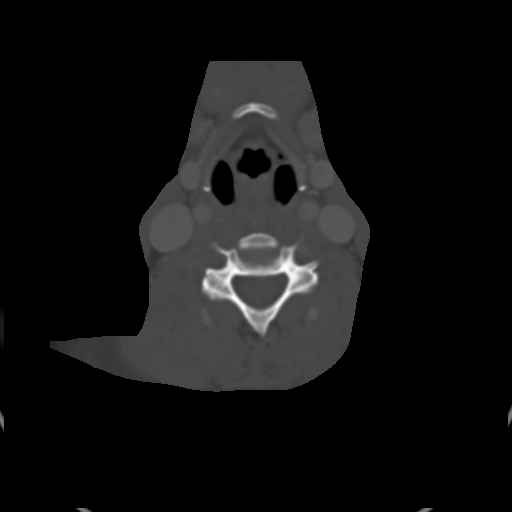
[im 81/121  bone]
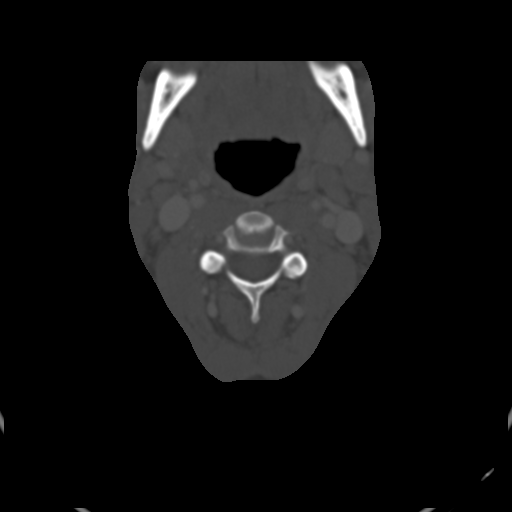
[im 94/121  bone]
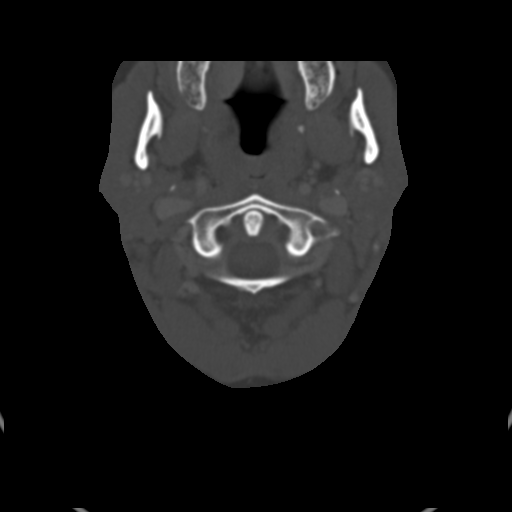
[im 107/121  bone]
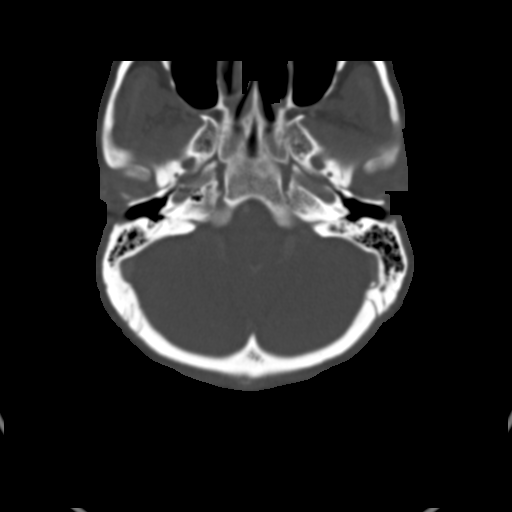

[Series 602: coronal · coronal · 0.47mm/px · 3 of 62 slices shown]
[im 13/62  bone]
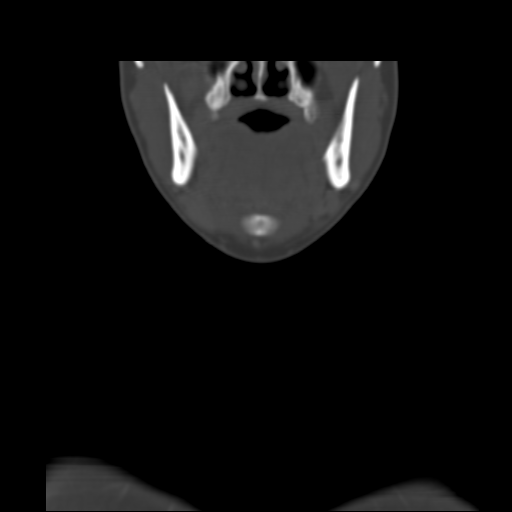
[im 25/62  bone]
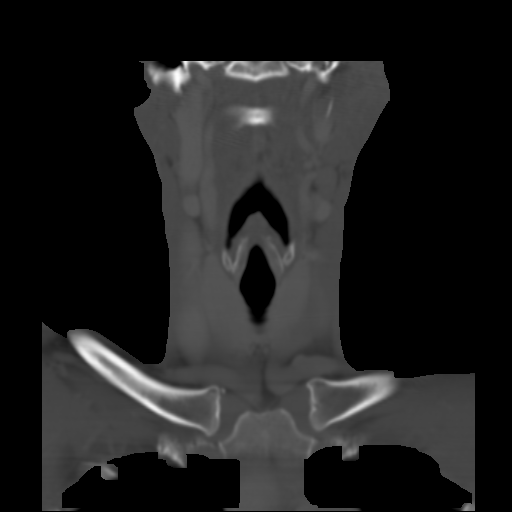
[im 37/62  bone]
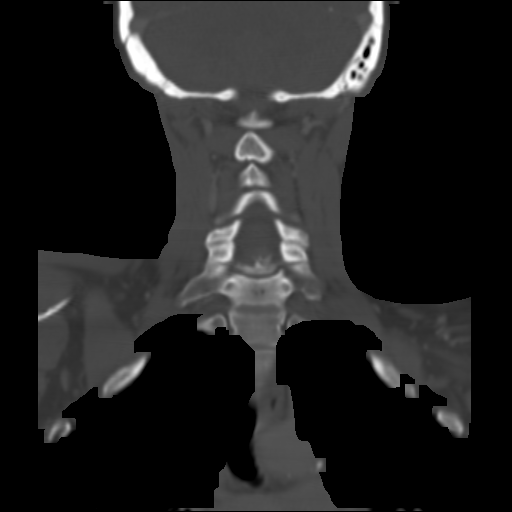

[Series 603: sagittal · sagittal · 0.47mm/px · 5 of 61 slices shown, 6 images]
[im 21/61  bone]
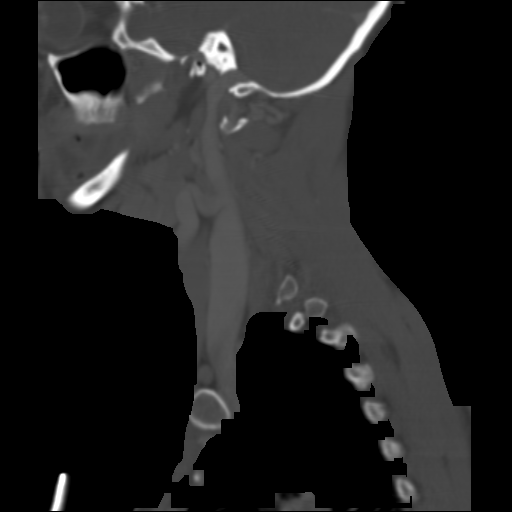
[im 26/61  bone]
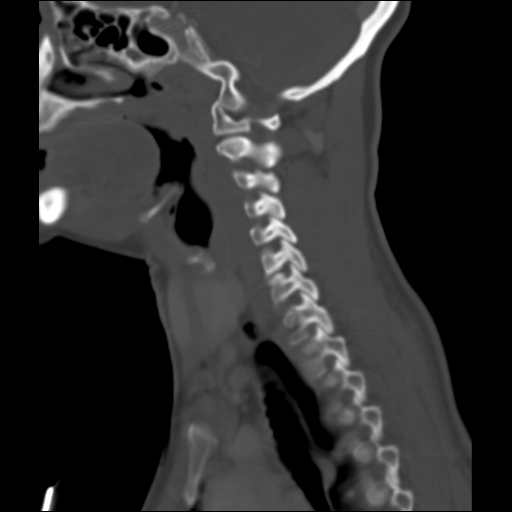
[im 31/61  soft-tissue]
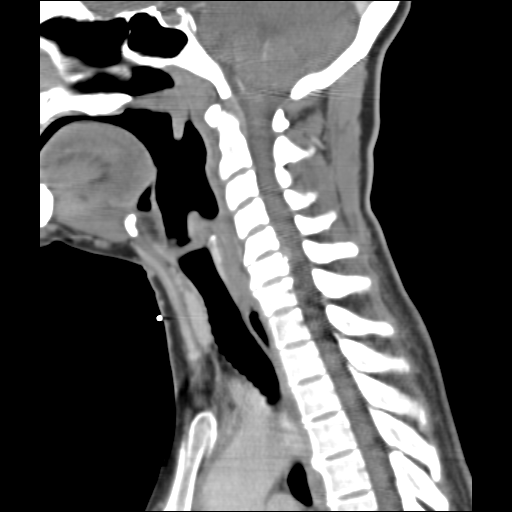
[im 31/61  bone]
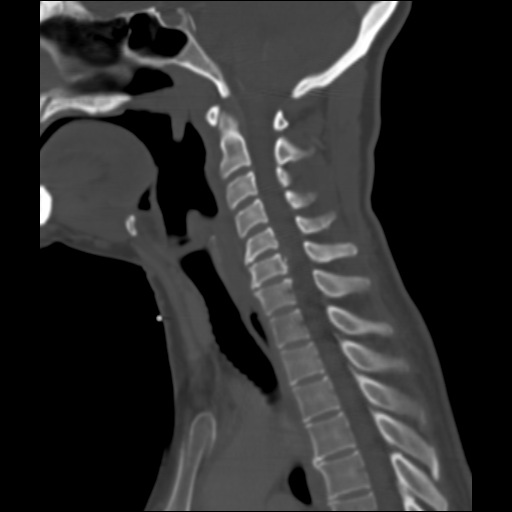
[im 36/61  bone]
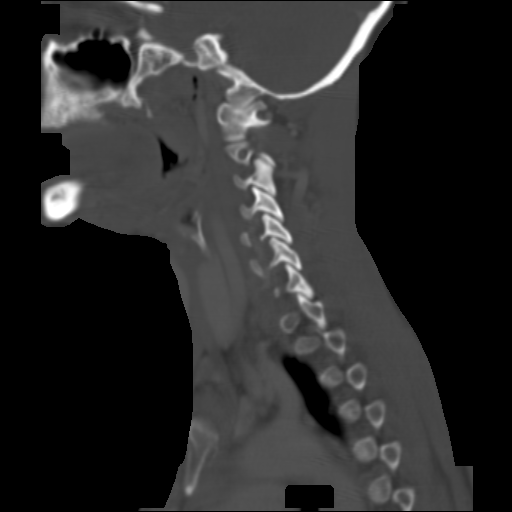
[im 41/61  bone]
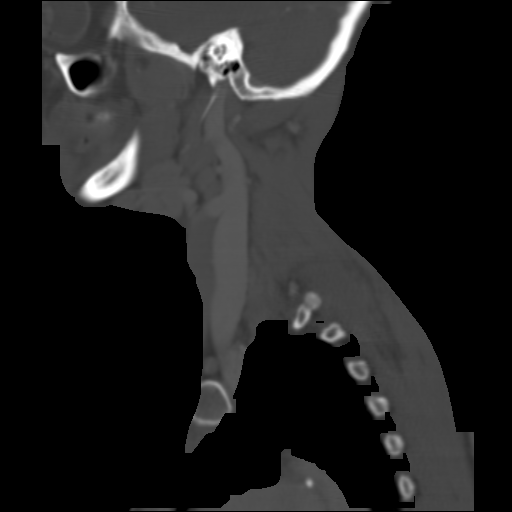

[16 of 33 positions shown; findings below may reference images not displayed]

FINDINGS: Lower right neck mild infiltration of the
dermis/subcutaneous region without underlying abscess, infiltration
of the muscles or septic thrombophlebitis.

Scattered normal sized lymph nodes with short axis dimension less
than 1 cm.

Mild prominence submandibular ducts unchanged and symmetric in
appearance.  No obstructing mass or stone.

Atherosclerotic type changes aortic arch.  Calcification distal
right  common carotid artery without high-grade stenosis.

Tiny nodules lung apices stable.  Incidentally is a small tracheal
diverticulum on the right at the thoracic inlet.

Visualized orbital structures and intracranial structures
unremarkable.  Visualized sinuses and mastoid air cells clear.  No
bony destructive lesion.

Cervical spondylotic changes with small to moderate sized broad-
based left posterior lateral C5-6 disc protrusion with deformity of
the left aspect of the cord.
IMPRESSION: Lower right neck mild infiltration of the dermis/subcutaneous
region without underlying abscess, infiltration of the muscles or
septic thrombophlebitis.

Cervical spondylotic changes with small to moderate sized broad-
based left posterior lateral C5-6 disc protrusion with deformity of
the left aspect of the cord.

Images reviewed with Dr. Lenis 09/17/2010 [DATE] a.m.

## 2012-08-30 ENCOUNTER — Telehealth: Payer: Self-pay | Admitting: *Deleted

## 2012-08-30 IMAGING — MR MR [PERSON_NAME] UP WO/W CM*L*
4 of 8 series · 19 of 40 positions shown · IV contrast (multihance)
Comparison: None

CLINICAL DATA: Pain and swelling.  History of MRSA.

MRI LEFT ELBOW WITH AND WITHOUT CONTRAST
TECHNIQUE: Multiplanar, multisequence MR imaging of the left elbow
was performed before and after the administration of intravenous
contrast.
Contrast: 10 ml Multihance

[Series 4: T2 fat-sat · sagittal · 3.0mm · 0.25mm/px · 5 of 24 slices shown (1 of 2)]
[im 1/24]
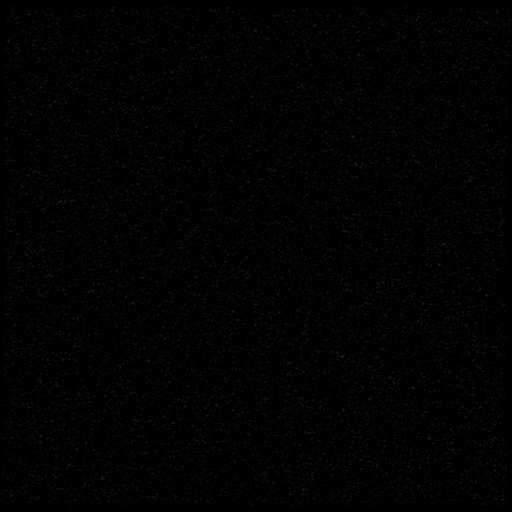
[im 6/24]
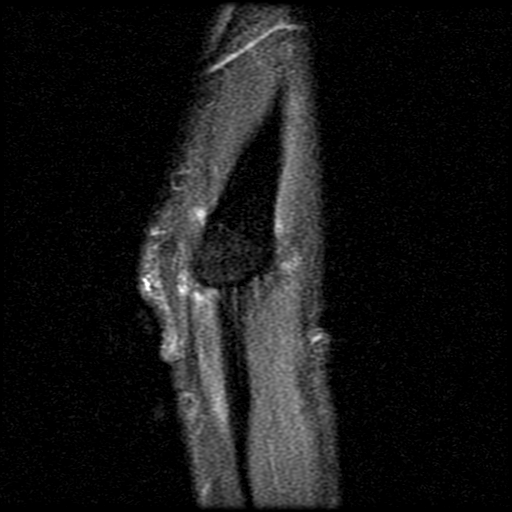
[im 12/24]
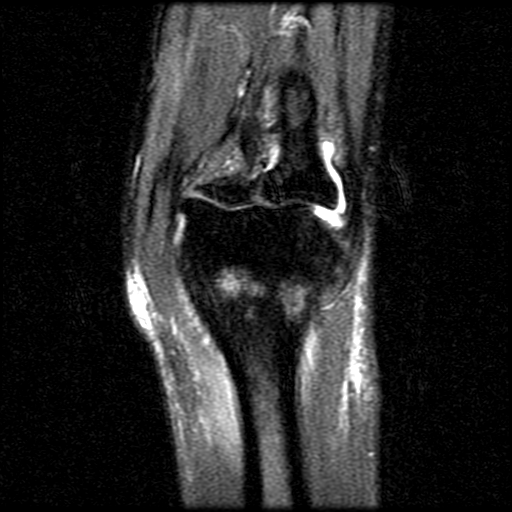
[im 18/24]
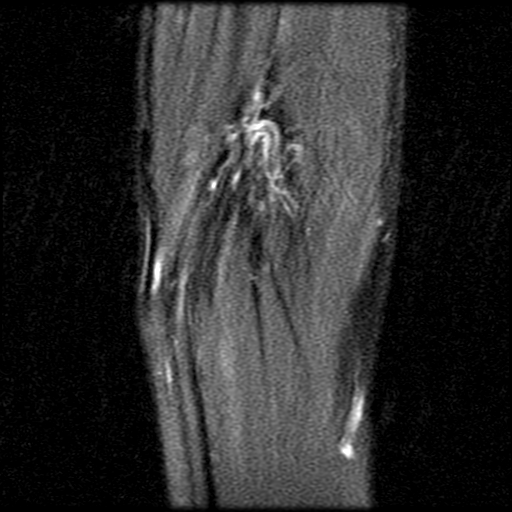
[im 24/24]
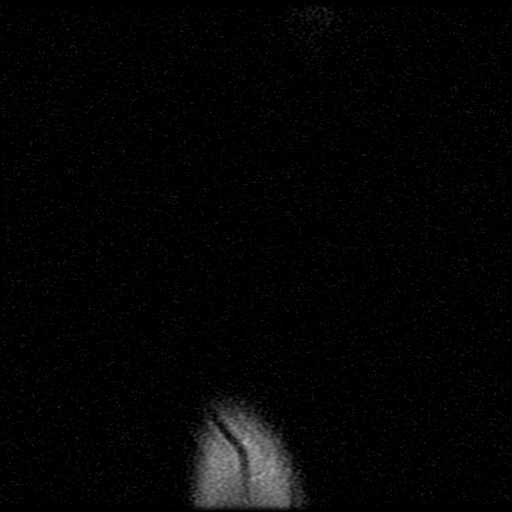

[Series 5: T2 fat-sat · axial · 3.0mm · 0.23mm/px · z∈[+3,+89]mm · 5 of 26 slices shown (2 of 2)]
[im 1/26]
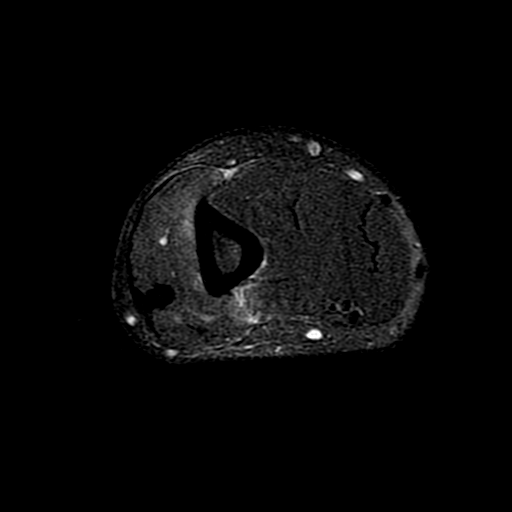
[im 7/26]
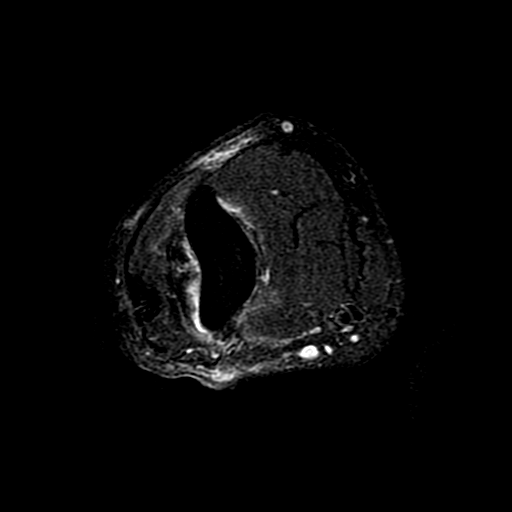
[im 13/26]
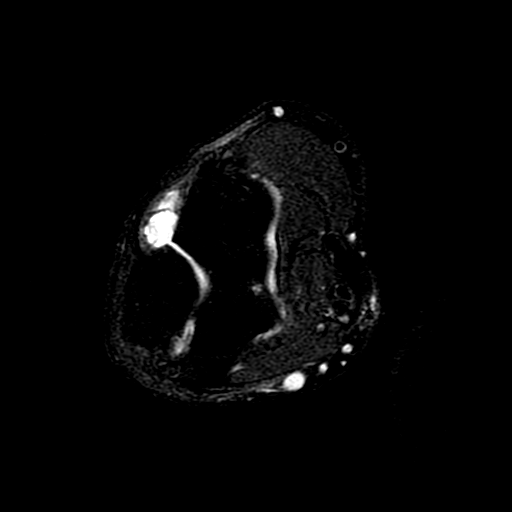
[im 19/26]
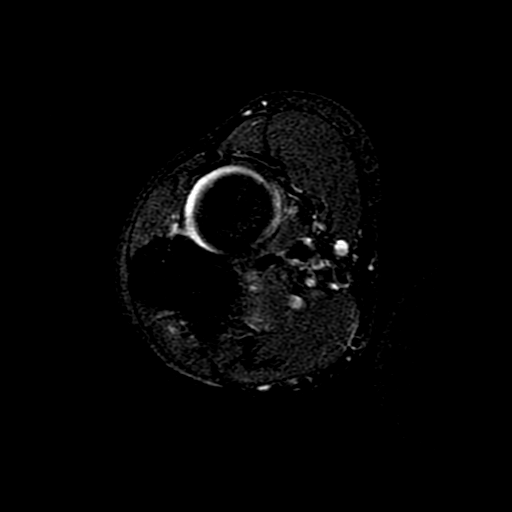
[im 26/26]
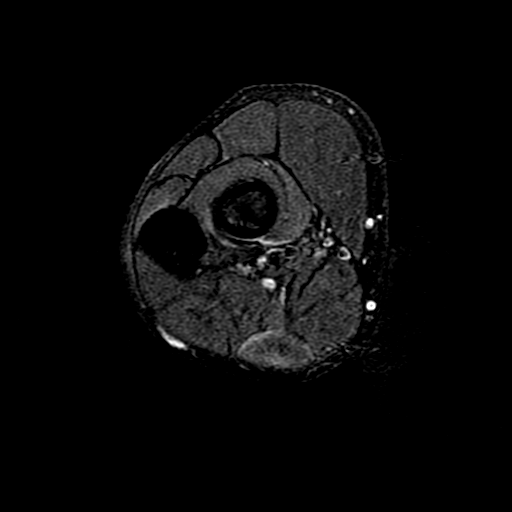

[Series 6: PD fat-sat · coronal · 3.0mm · 0.25mm/px · 5 of 24 slices shown]
[im 1/24]
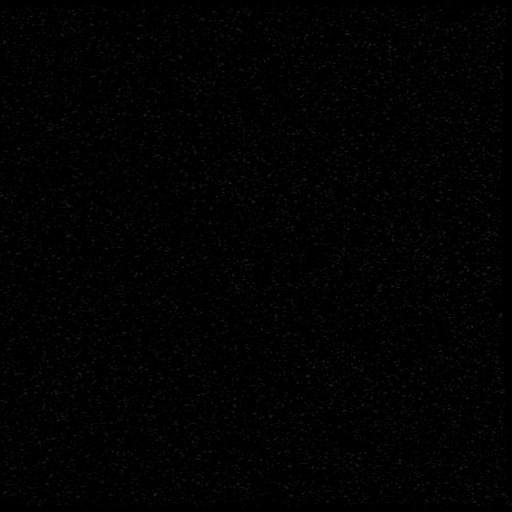
[im 6/24]
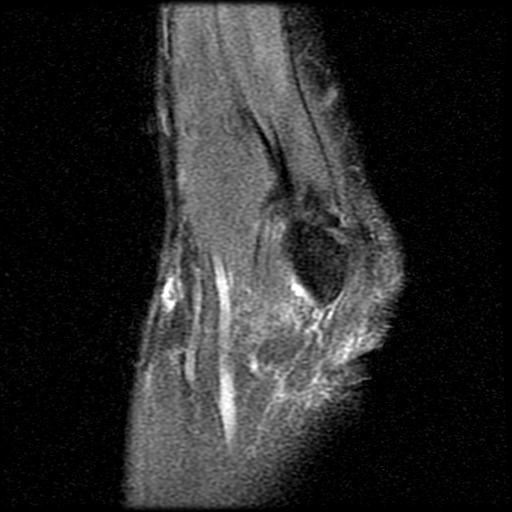
[im 12/24]
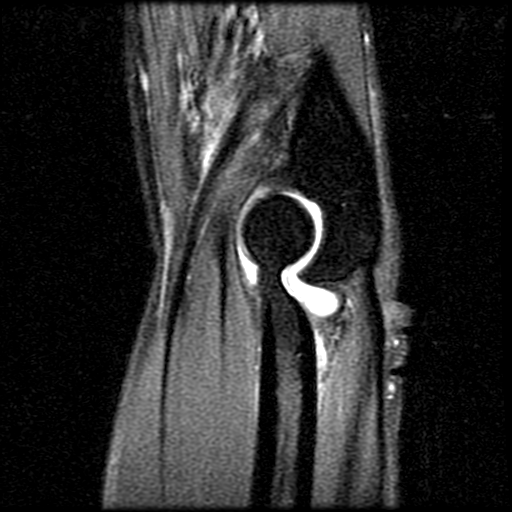
[im 18/24]
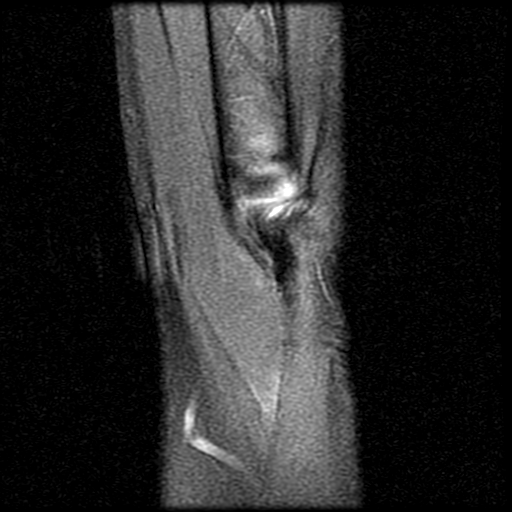
[im 24/24]
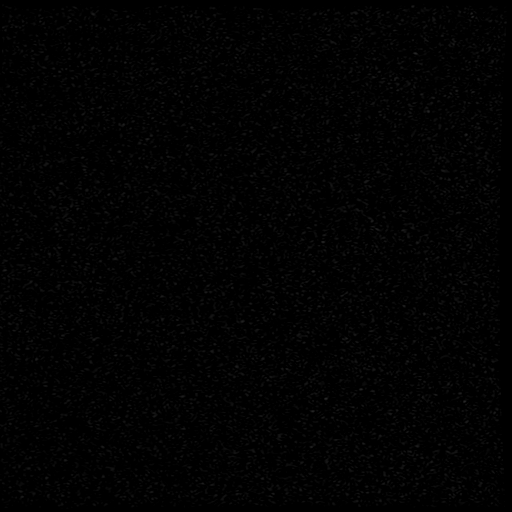

[Series 7: T1 fat-sat · axial · 3.0mm · 0.23mm/px · z∈[+3,+89]mm · 4 of 26 slices shown]
[im 1/26]
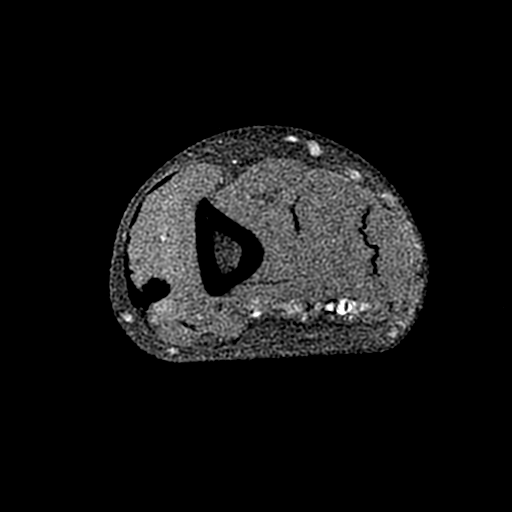
[im 7/26]
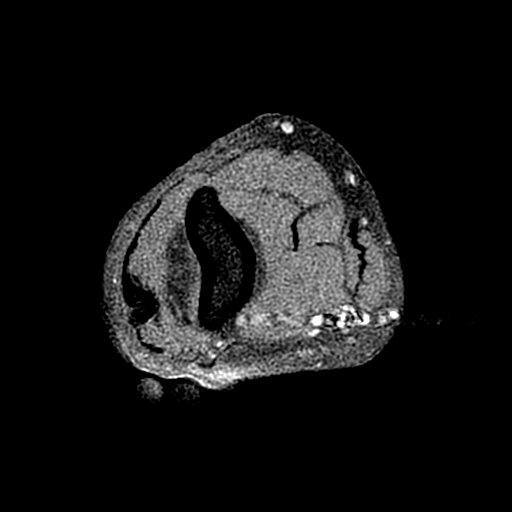
[im 13/26]
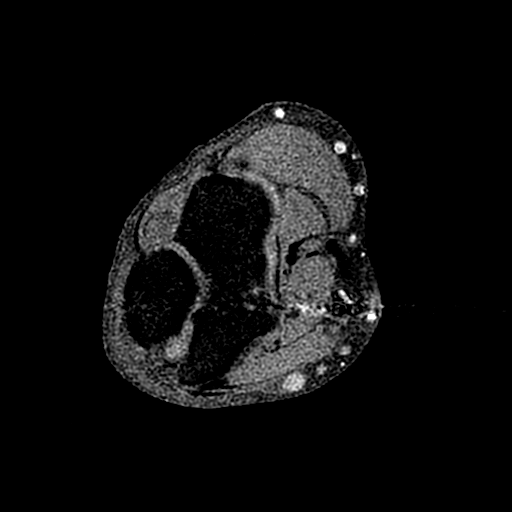
[im 26/26]
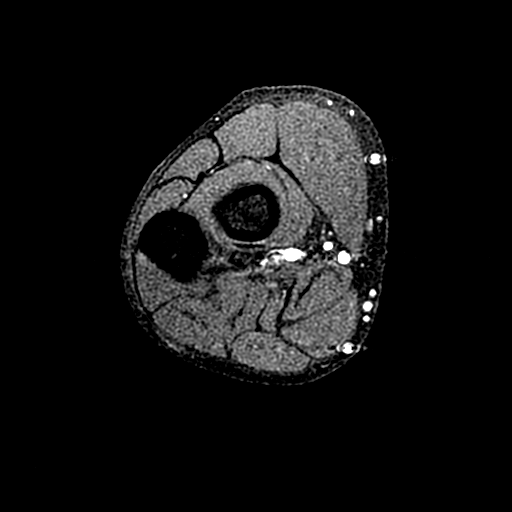

[19 of 40 positions shown; findings below may reference images not displayed]

FINDINGS: There is mild edema like signal abnormality involving the
subcutaneous tissues around the left elbow which may suggest
cellulitis.  No discrete soft tissue abscess.  There is a moderate
sized elbow joint effusion with mild synovial enhancement but no MR
findings suspicious for septic arthritis or osteomyelitis.  If
there is any clinical suspicion of septic arthritis joint
aspiration is suggested.

There are mild degenerative changes.  No discrete osteochondral
lesion.  No loose bodies are seen the joint.  The radial and ulnar
collateral ligaments are intact and the common flexor and extensor
tendons are intact.  The biceps, triceps and brachialis tendons are
intact.
IMPRESSION: 1.  Elbow joint effusion without definite MR findings for septic
arthritis.  Recommend clinical correlation and joint aspiration if
strong clinical suspicion.
2.  Mild cellulitis without findings for discrete soft tissue
abscess, myofasciitis or osteomyelitis.
3.  Elbow joint degenerative changes.
4.  Intact major ligaments and tendons.

## 2012-08-30 IMAGING — RF DG FLUORO GUIDE NDL PLC/BX
2 series · 2 of 2 positions shown · non-contrast
Comparison: none

CLINICAL DATA: Left elbow joint effusion.

LEFT ELBOW JOINT ASPIRATION.
The procedure, risks, benefits and alternatives were explained to
the patient.  Questions regarding the procedure were encouraged and
answered.  Written consent was obtained.
TECHNIQUE: An appropriate skin entrance site was determined .  The
site was marked, prepped with Betadine, draped in the usual sterile
fashion.  Lidocaine was used for local anesthesia.  Subsequently, a
needle was advanced into the joint between radial head and
capitellum using a lateral approach.  3 ml of cloudy fluid was
aspirated without complication.  Fluid will be sent for appropriate
evaluation.
Fluoroscopy Time: 0

[Series 1: run · 1 of 1 slices shown (1 of 2)]
[im 1/1]
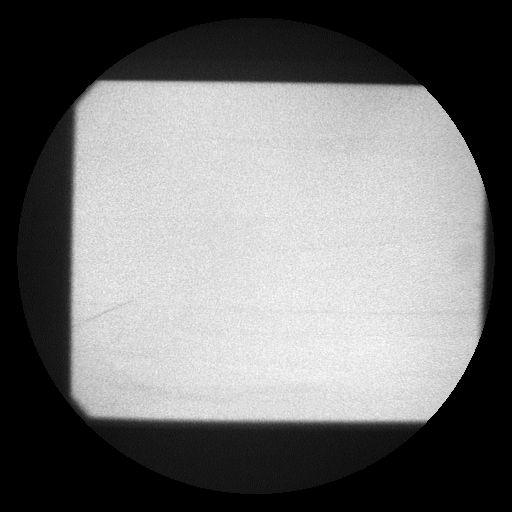

[Series 2: run · 1 of 1 slices shown (2 of 2)]
[im 1/1]
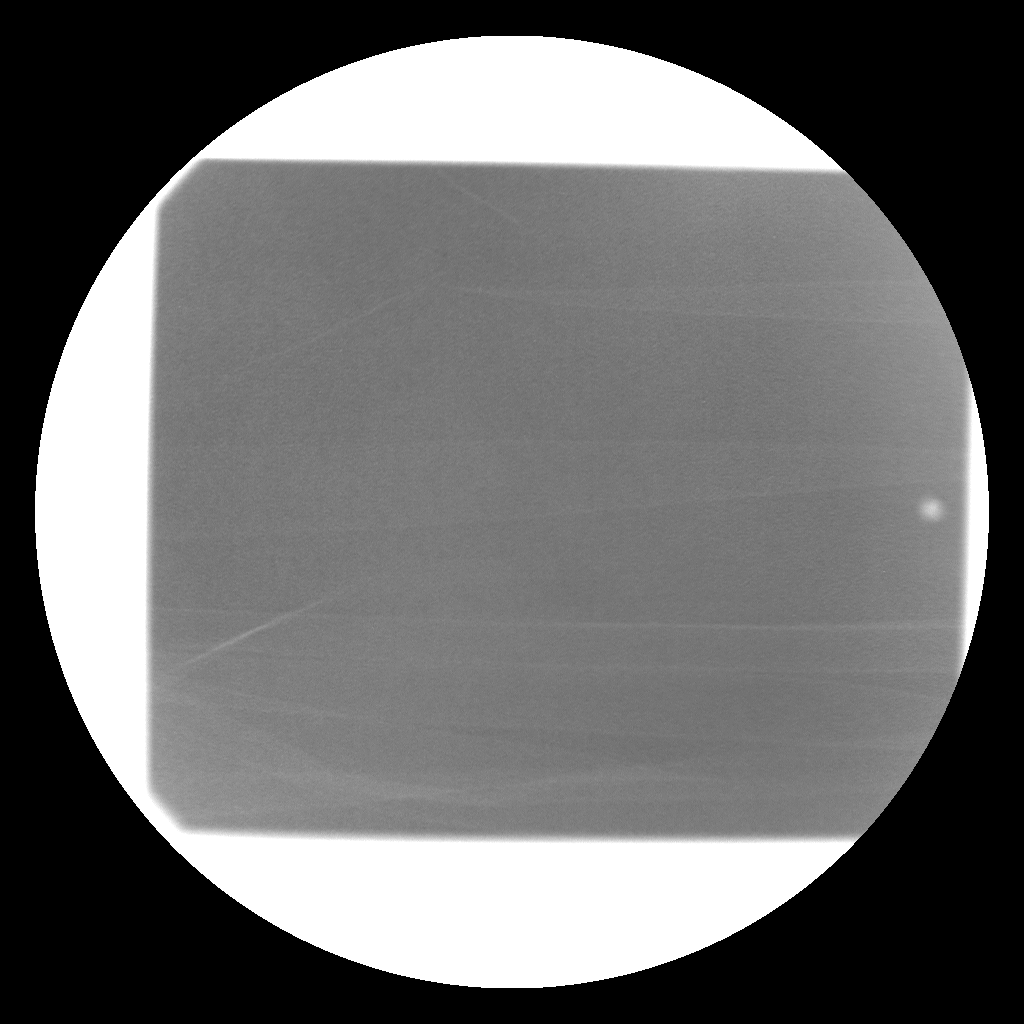

[2 of 2 positions shown; findings below may reference images not displayed]

IMPRESSION: Left elbow joint aspiration.

## 2012-08-30 NOTE — Telephone Encounter (Signed)
Agree with an Appt in am

## 2012-08-30 NOTE — Telephone Encounter (Signed)
Pt calls and states her ear pain continues and she needs refill on vicoprofen, she is given an appt w/ the med student wed 11/27 at 0830

## 2012-08-31 ENCOUNTER — Ambulatory Visit (INDEPENDENT_AMBULATORY_CARE_PROVIDER_SITE_OTHER): Payer: Medicaid Other | Admitting: Internal Medicine

## 2012-08-31 ENCOUNTER — Encounter: Payer: Self-pay | Admitting: Internal Medicine

## 2012-08-31 VITALS — BP 115/66 | HR 60 | Temp 97.2°F | Resp 20 | Ht 63.0 in | Wt 106.6 lb

## 2012-08-31 DIAGNOSIS — I776 Arteritis, unspecified: Secondary | ICD-10-CM

## 2012-08-31 MED ORDER — PREDNISONE 20 MG PO TABS: ORAL_TABLET | ORAL | Status: DC

## 2012-08-31 MED ORDER — HYDROCODONE-IBUPROFEN 5-200 MG PO TABS: 1.0000 | ORAL_TABLET | Freq: Three times a day (TID) | ORAL | Status: DC | PRN

## 2012-08-31 NOTE — Assessment & Plan Note (Signed)
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 She wants to quit by herself and doing well with being cocaiane free for last 7 days. I congratulated her for that and advised her to seek medical care/social work help if she feels she needs help. I will prescribe her vicoprofen 30 tabs enough for next 15 days for pain control and a prescription of prednisone pending if she sees any new lesions. I do not feel that she has any need for antibiotics in the absence of active infected lesions.

## 2012-08-31 NOTE — Progress Notes (Addendum)
Subjective:     [name], female   DOB: 1961-07-19, 51 y.o.   MRN: [id number]  HPI 51 year old female with a PMH of vasculitis (levamizole-induced vasculitis), polysubstance abuse, depression, HTN presents to the clinic because of continued ear pain. Patient has had multiple ED and office visits for lemivazole-induced vasculitis since September 2013. The ear pain currently an 8/10, is taking vicroprofen to control the pain as well as prednisone for the vasculitis.  Last cocaine use was 8 days ago. Patient is requesting a refill of her medications as well as gauze, antibacterial cream and other materials to properly protect her ear. She has no other complaints and denies headaches, SOB, chest pain, N/V, diarrhea or constipation.  Past Medical History  Diagnosis Date  . 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 Followed by Dr. Louanne Skye  . Hypertension   . Cocaine abuse     ongoing with resultant vaculitis.  . Tobacco abuse   . Depression   . Normocytic anemia     BL Hgb 9.8-12. Last anemia panel 04/2010 - showing Fe 19, ferritin 101.  Pt on monthly B12 injections  . Rheumatoid arthritis     patient reported  . Inflammatory arthritis    Past Surgical History  Procedure Date  . Skin biopsy     bilateral shin nodules on 04/2010  . Hernia repair    Family History  Problem Relation Age of Onset  . Breast cancer Mother     Breast cancer  . Alcohol abuse Mother   . Colon cancer Maternal Aunt 45  . Alcohol abuse Father    History   Social History  . Marital Status: Single    Spouse Name: N/A    Number of Children: 0  . Years of Education: 11th grade   Occupational History  . Disability     since 2011, due to her rheumatoid arthritis   Social History Main Topics  . Smoking status: Current Every Day Smoker -- 0.2 packs/day for 39 years    Types: Cigarettes  . Smokeless tobacco: Never Used     Comment: trying  . Alcohol Use: No  . Drug Use: Yes    Special: "Crack"  cocaine, Marijuana, Cocaine     Comment: smokes cocaine  . Sexually Active: No   Other Topics Concern  . 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   HENT: Positive for ear pain.   Eyes: Negative.   Respiratory: Negative.   Cardiovascular: Positive for chest pain (resolved).  Gastrointestinal: Negative.   Genitourinary: Negative.   Musculoskeletal: Negative.   Skin: Positive for wound (nose and left ear).  Neurological: Negative.   Psychiatric/Behavioral: Negative.        Objective:   Physical Exam  Constitutional: She is oriented to person, place, and time.  HENT:  Head: Normocephalic.  Right Ear: Hearing normal. No decreased hearing is noted.  Left Ear: Hearing normal. There is tenderness. No decreased hearing is noted.  Ears:  Nose: Nasal deformity present.       1-12m Septal perforation noted  Eyes: Pupils are equal, round, and reactive to light. Right eye exhibits no discharge. Left eye exhibits no discharge. No scleral icterus.  Neck: Normal range of motion.  Cardiovascular: Normal rate, regular  rhythm and intact distal pulses.  Exam reveals no gallop and no friction rub.   No murmur heard. Pulmonary/Chest: Effort normal. No respiratory distress. She has no wheezes. She has no rales. She exhibits no tenderness.  Abdominal: Soft. She exhibits no distension. There is no tenderness. There is no rebound.  Musculoskeletal: Normal range of motion. She exhibits no edema and no tenderness.  Neurological: She is alert and oriented to person, place, and time. No cranial nerve deficit.  Skin: She is not diaphoretic.  Psychiatric: She has a normal mood and affect.       Assessment/Plan:     51 year old patient with levamizole-induced vasculitis presents to the clinic for refill  of medications for ear pain and gauze and antibacterial cream to protect her left ear. 1. 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 Lesion should decrease in pain as it heals over the next few weeks. - Pain: Vicoprofen 5-275m PO q8hrs - Vasculitis: Prednisone 235m(tapered schedule). Patient will only take the prednisone if new lesions appear. Advised not to take Prednisone at this time as the lesion will heal on its own as long as the patient does not use cocaine. - Sepal perforation: Will refer to ENT once patient is not at risk of using cocaine  - Urine drug screen ordered.

## 2012-08-31 NOTE — Progress Notes (Signed)
  Subjective:    [name], female    DOB: January 17, 1961, 51 y.o.   MRN: [id number]  HPI Patient is 51 year old female with past medical history of Levamisole induced vasculitis. Patient has not done cocaine in last 7 days and has done well. She says that all the help programs that she has attended in the past and make her do more cocaine when she gets out(rebound). She wants to quit by herself. The lesions are in the healing stage and there are no new lesions or rashes. Patient still describes 8/10 pain from her ears which are both red and erythematous. No fever chills, nausea, vomiting or diarrhea noted.   Review of Systems  Constitutional: Negative for fever, activity change and appetite change.  HENT: Positive for ear pain. Negative for sore throat.   Respiratory: Negative for cough and shortness of breath.   Cardiovascular: Negative for chest pain and leg swelling.  Gastrointestinal: Negative for nausea, abdominal pain, diarrhea, constipation and abdominal distention.  Genitourinary: Negative for frequency, hematuria and difficulty urinating.  Skin: Positive for rash (over the cheeks) and wound (over the ears).  Neurological: Negative for dizziness and headaches.  Psychiatric/Behavioral: Negative for suicidal ideas and behavioral problems.       Objective:   Physical Exam  Constitutional: She is oriented to person, place, and time. She appears well-developed and well-nourished.  HENT:  Head: Normocephalic and atraumatic.       Patient has active lesions with erosion of the cartilage over both external ears. She has 2-3 mm perforation in the nasal septum which is well endothelialized.  Eyes: Conjunctivae normal and EOM are normal. Pupils are equal, round, and reactive to light. No scleral icterus.  Neck: Normal range of motion. Neck supple. No JVD present. No thyromegaly present.  Cardiovascular: Normal rate, regular rhythm, normal heart sounds and intact distal pulses.   Exam reveals no gallop and no friction rub.   No murmur heard. Pulmonary/Chest: Effort normal and breath sounds normal. No respiratory distress. She has no wheezes. She has no rales.  Abdominal: Soft. Bowel sounds are normal. She exhibits no distension and no mass. There is no tenderness. There is no rebound and no guarding.  Musculoskeletal: Normal range of motion. She exhibits no edema and no tenderness.  Lymphadenopathy:    She has no cervical adenopathy.  Neurological: She is alert and oriented to person, place, and time.  Psychiatric: She has a normal mood and affect. Her behavior is normal.          Assessment & Plan:

## 2012-08-31 NOTE — Patient Instructions (Addendum)
Please take the Prednisone ONLY if you have new areas of redness and peeling.  Take the pain medications as prescribed.   Please STOP cocaine use! You have been doing great over the last week, so continue to help yourself by not doing any more drugs.  It was a pleasure seeing you today at the outpatient clinic.   Cocaine Abuse PROBLEMS FROM USING COCAINE:   Highly addictive.  Illegal.  Risk of sudden death.  Heart disease.  Irregular heart beat.  High blood pressure.  Damage to nose and lungs.  Severe agitation.  Hallucinations.  Violent behavior.  Paranoia.  Sexual dysfunction. Most cocaine users deny that they have a problem with addiction. The biggest problem is admitting that you are dependent on cocaine. Those trying to quit using it may experience depression and withdrawal symptoms. Other withdrawal symptoms include fatigue, suicidal thoughts, sleepiness, restlessness, anxiety, and increased craving for cocaine. There are medications available to help prevent depression associated with stopping cocaine. Most users will find a support group or treatment program helpful in coming off and staying off cocaine. The best chance to cure cocaine addiction is to go into group therapy and to be in a drug-free environment. It is very important to develop healthy relationships and avoid socializing with people who use or deal drugs. Eat well, and give your body the proper rest and healthy exercise it needs. You may need medication to help treat withdrawal symptoms. Call your caregiver or a drug treatment center for more help.  You may also want to call the [facility] on [facility] at 800-662-HELP in the [place] IF:  You develop severe chest pain.  You develop shortness of breath.  You develop extreme agitation. Document Released: 10/29/2004 Document Revised: 12/14/2011 Document Reviewed: 07/24/2009 [facility] Patient Information 2013 [place].

## 2012-09-01 LAB — PRESCRIPTION ABUSE MONITORING 15P, URINE
Barbiturate Screen, Urine: NEGATIVE ng/mL
Cocaine Metabolites: NEGATIVE ng/mL
Creatinine, Urine: 38.57 mg/dL (ref >20.0)
Meperidine, Ur: NEGATIVE ng/mL
Methadone Screen, Urine: NEGATIVE ng/mL
Propoxyphene: NEGATIVE ng/mL

## 2012-09-02 LAB — TRAMADOL, URINE
N-DESMETHYL-CIS-TRAMADOL: >1000 ng/mL — ABNORMAL HIGH
Tramadol, Urine: 8525 ng/mL — ABNORMAL HIGH

## 2012-09-02 LAB — OPIATES/OPIOIDS (LC/MS-MS)
Codeine Urine: NEGATIVE ng/mL
Heroin (6-AM), UR: NEGATIVE ng/mL
Hydromorphone: NEGATIVE ng/mL
Noroxycodone, Ur: NEGATIVE ng/mL

## 2012-09-02 LAB — ZOLPIDEM (LC/MS-MS), URINE: Zolpidem (GC/LC/MS), Ur confirm: NEGATIVE ng/mL

## 2012-09-18 IMAGING — CR DG CHEST 2V
2 series · 2 of 2 positions shown · non-contrast
Comparison: 08/29/2010and 05/30/2009

CLINICAL DATA: Cough and congestion.  Sore throat.

CHEST - 2 VIEW

[w chest pa]
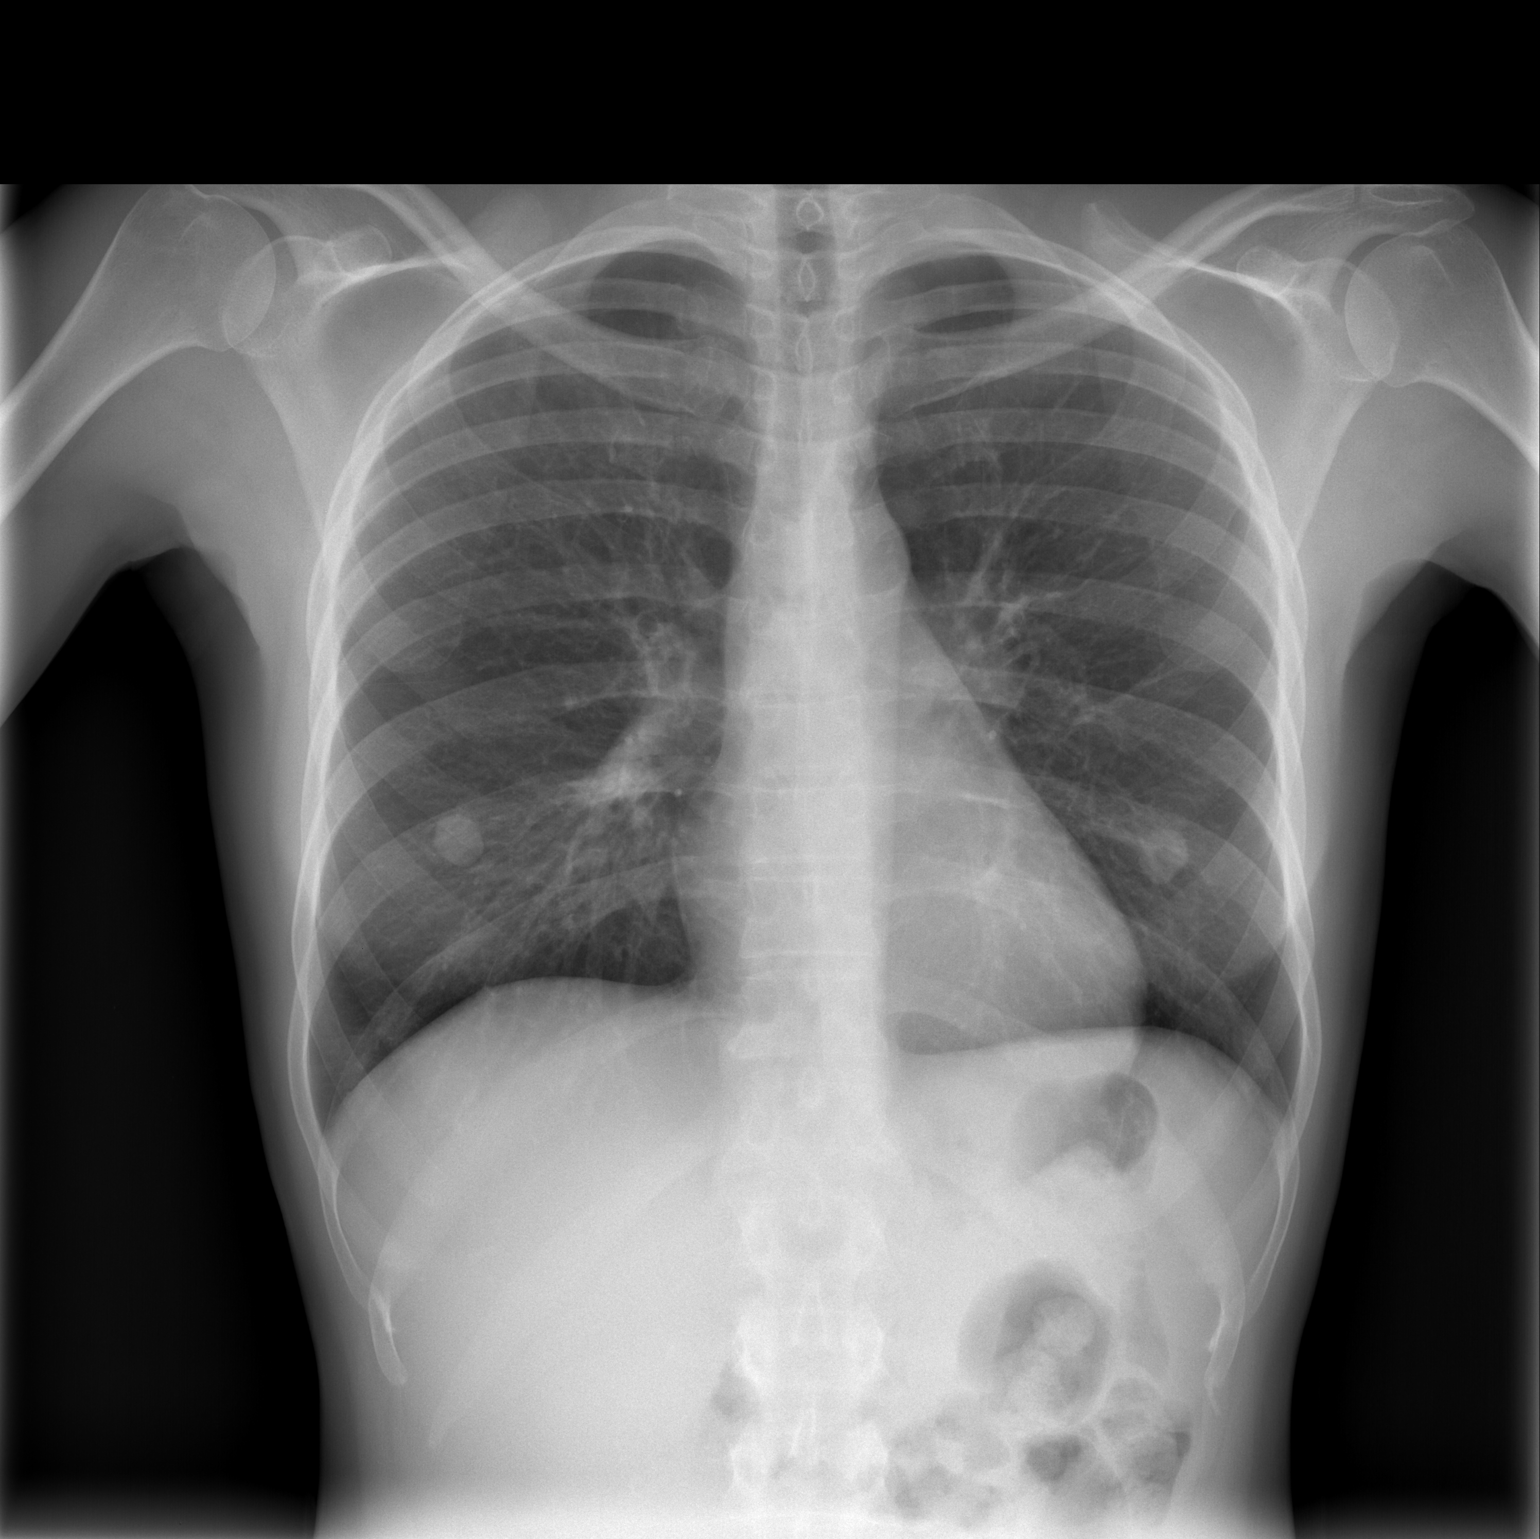

[w chest lat]
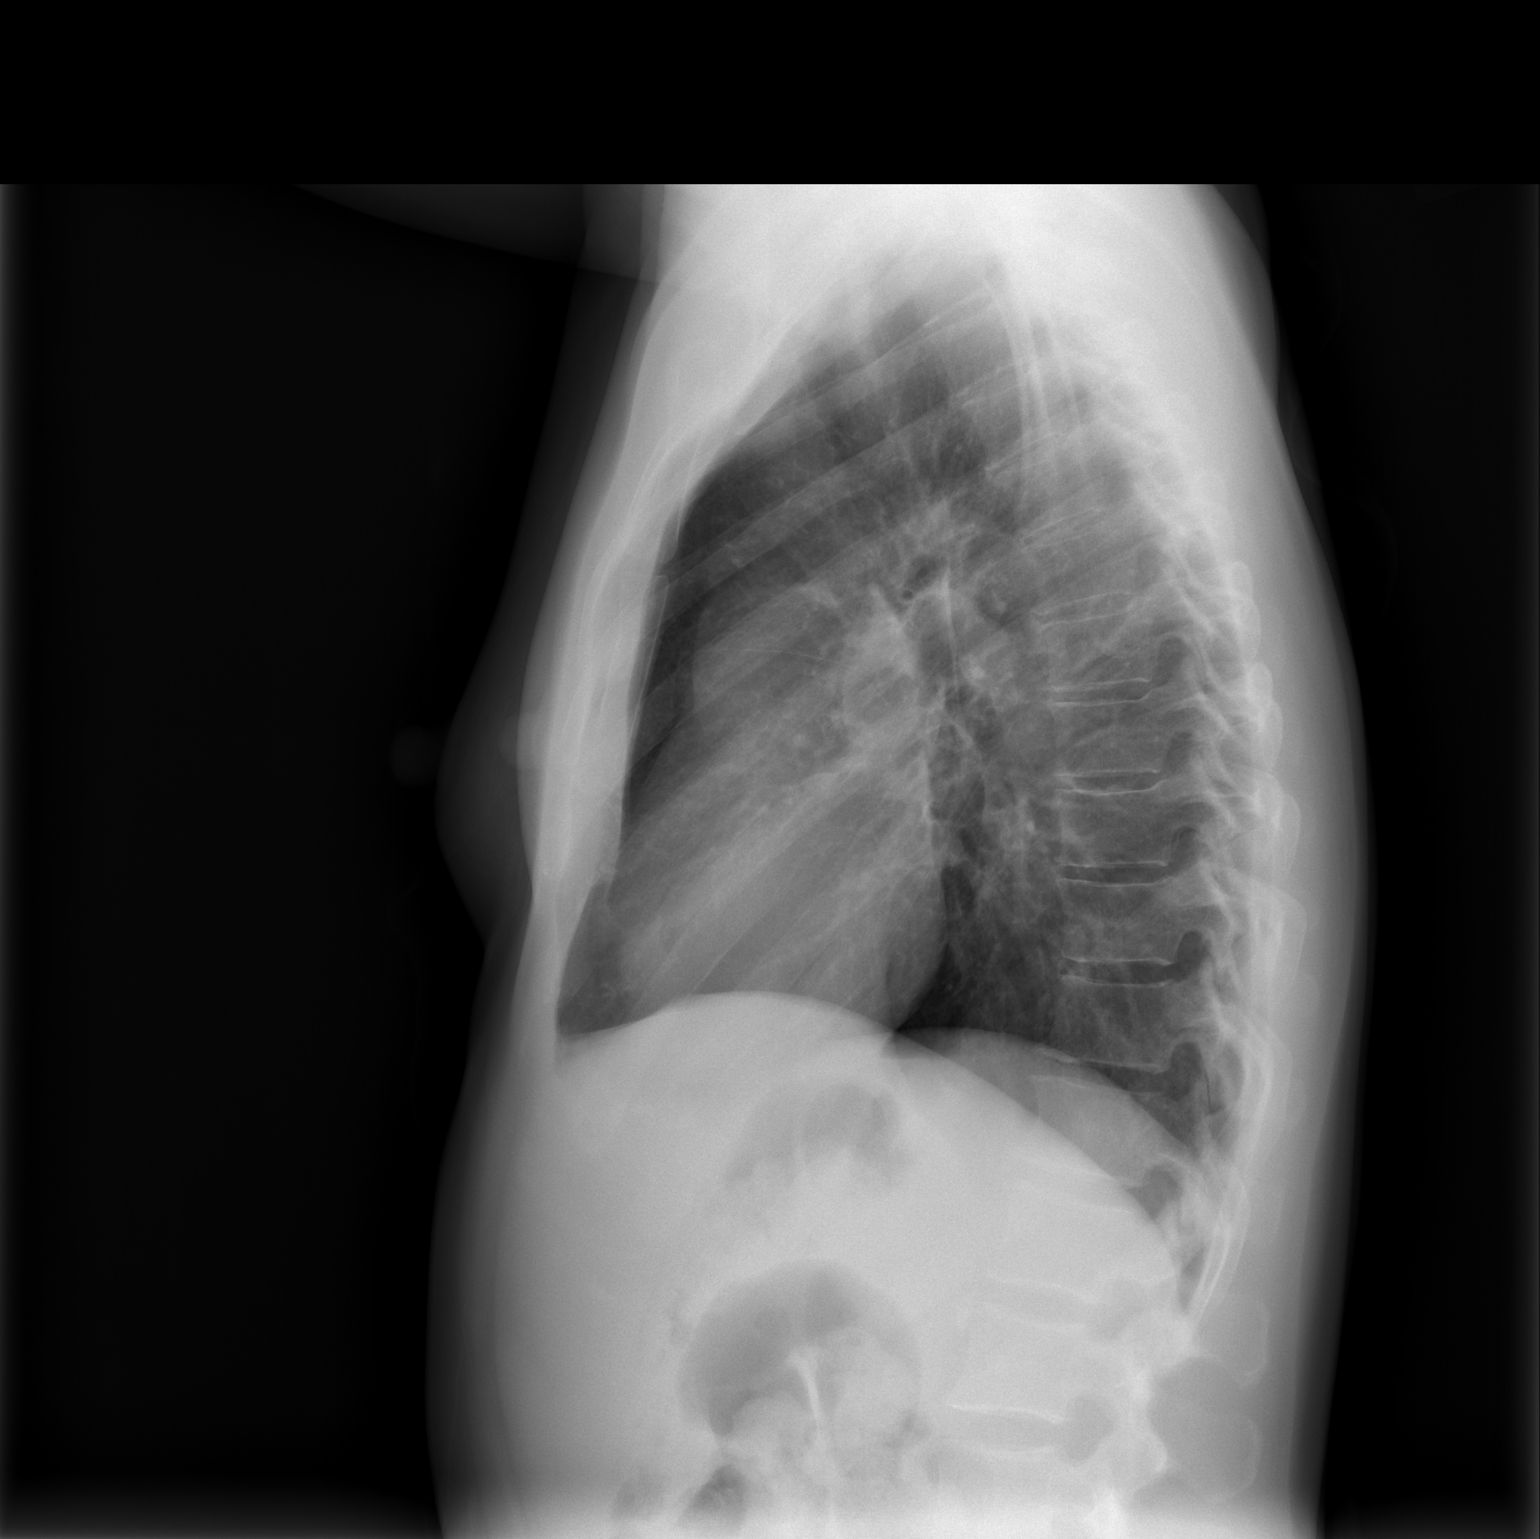

[2 of 2 positions shown; findings below may reference images not displayed]

FINDINGS: The lungs are clear without focal infiltrate, edema,
pneumothorax or pleural effusion. The cardiopericardial silhouette
is within normal limits for size.  Nipple shadow over each lower
lung is confirmed on the lateral projection. Imaged bony structures
of the thorax are intact.
IMPRESSION: Stable.  Normal exam.

## 2012-09-25 IMAGING — CT CT ABD-PELV W/ CM
2 of 5 series · 17 of 46 positions shown, 19 images · IV contrast (APPLIED)
Comparison: 07/26/2010 and earlier.

CLINICAL DATA: 49-year-old female with abdominal pain on the right.
Fever, history of hernia repair.

CT ABDOMEN AND PELVIS WITH CONTRAST
TECHNIQUE: Multidetector CT imaging of the abdomen and pelvis was
performed following the standard protocol during bolus
administration of intravenous contrast.
Contrast: 100 ml [id number].

[Series 2: abd_pel 5.0 b40f st · axial · 0.64mm/px · z∈[-225,+205]mm · 14 of 96 slices shown, 16 images]
[im 5/96  soft-tissue]
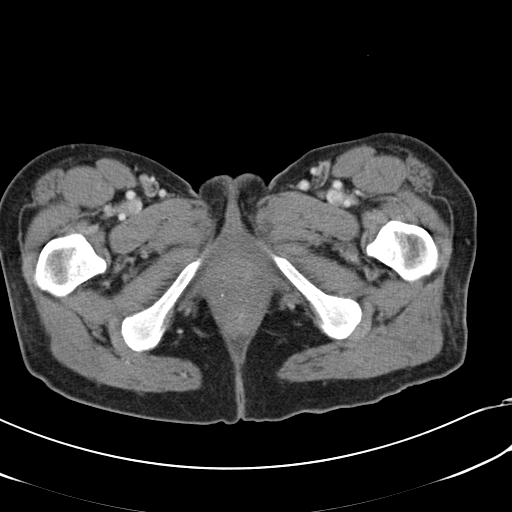
[im 5/96  bone]
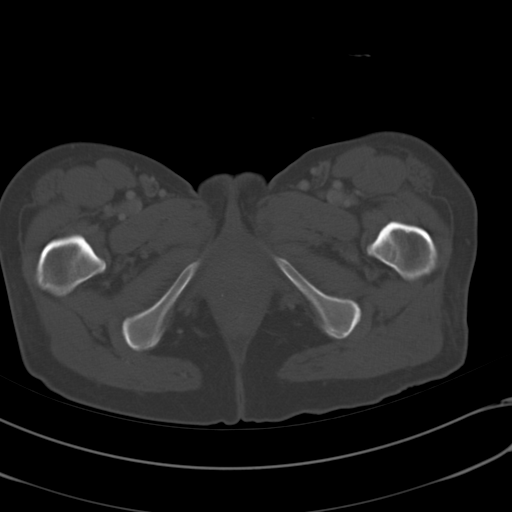
[im 15/96  soft-tissue]
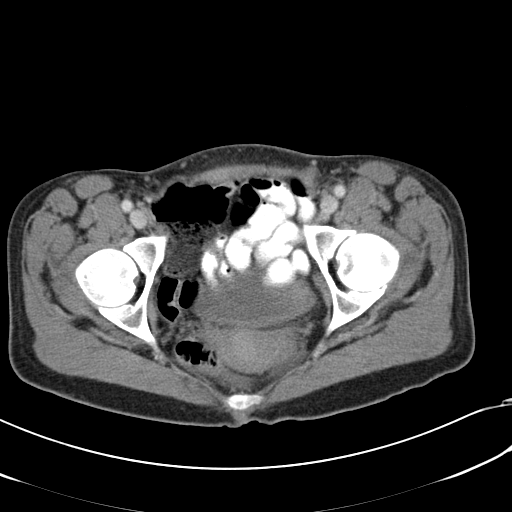
[im 20/96  soft-tissue]
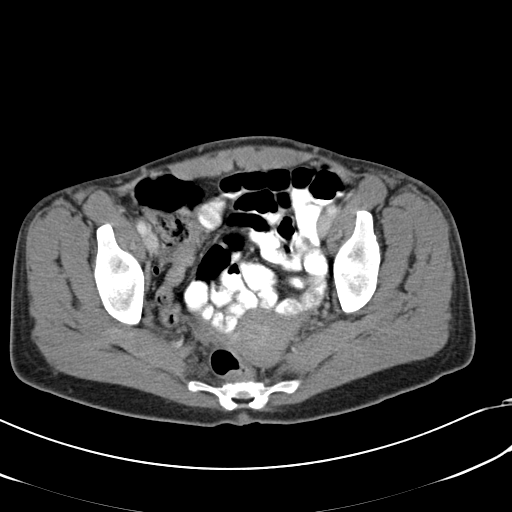
[im 24/96  soft-tissue]
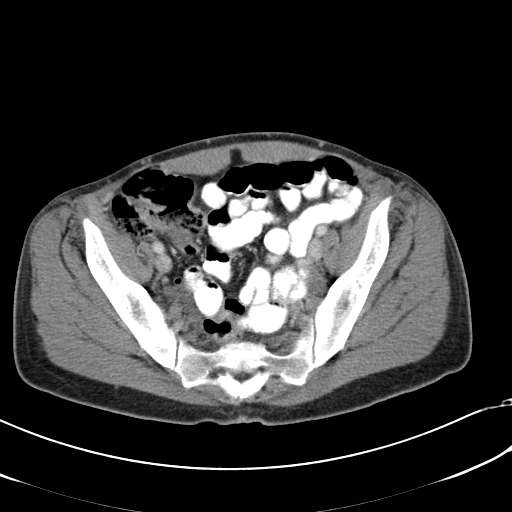
[im 34/96  soft-tissue]
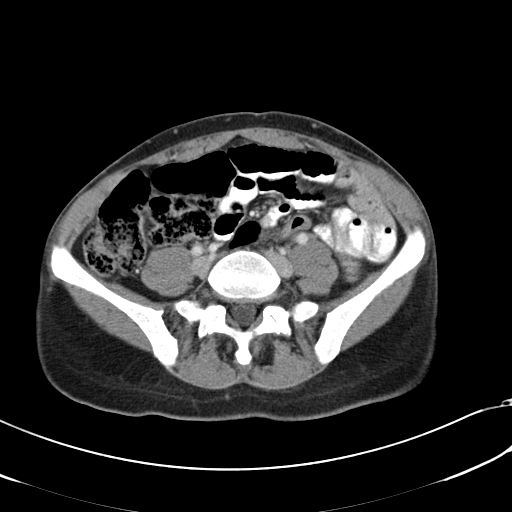
[im 39/96  soft-tissue]
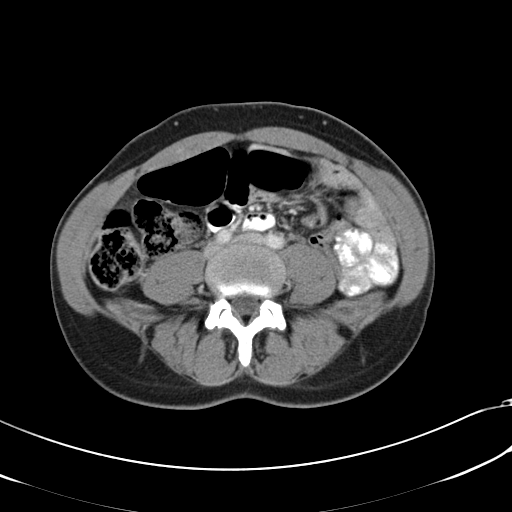
[im 43/96  soft-tissue]
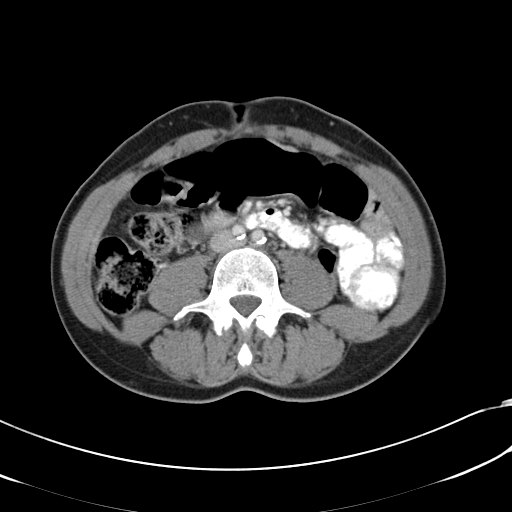
[im 53/96  soft-tissue]
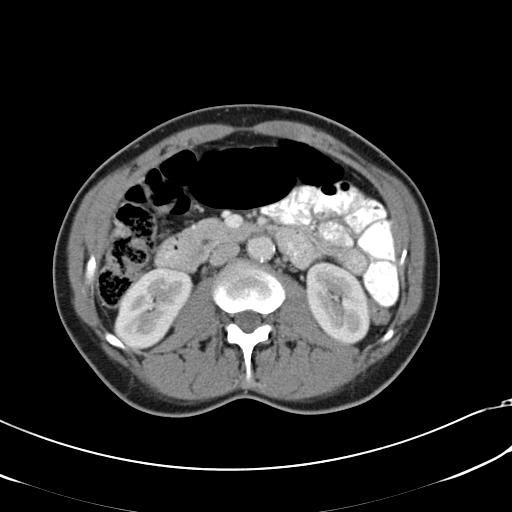
[im 58/96  soft-tissue]
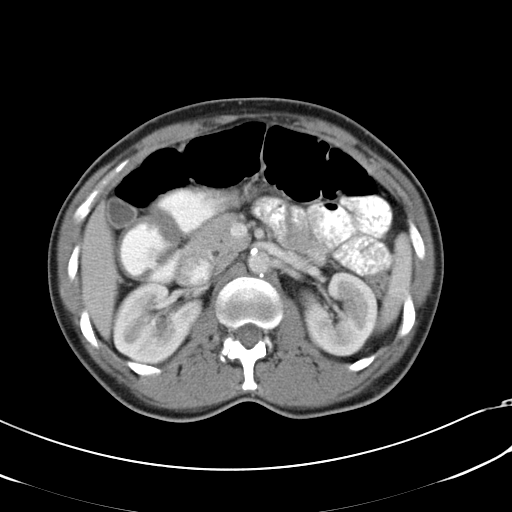
[im 58/96  bone]
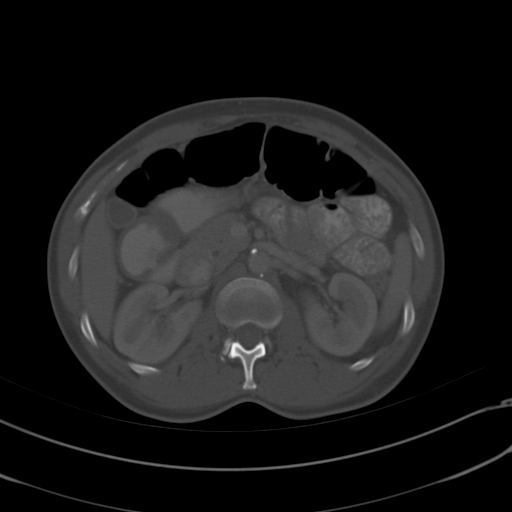
[im 62/96  soft-tissue]
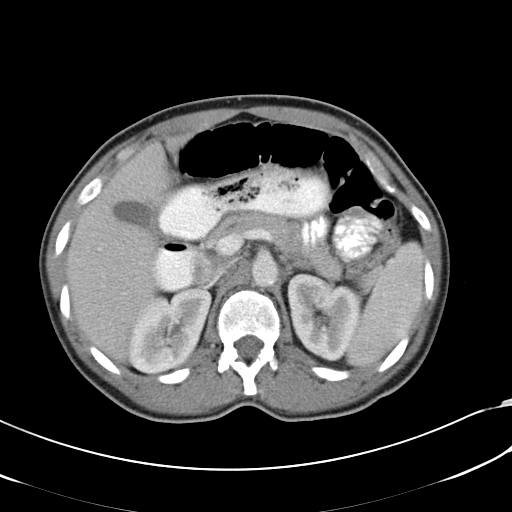
[im 72/96  soft-tissue]
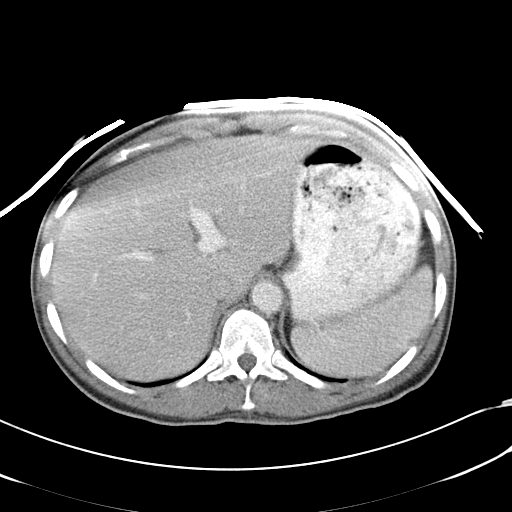
[im 77/96  soft-tissue]
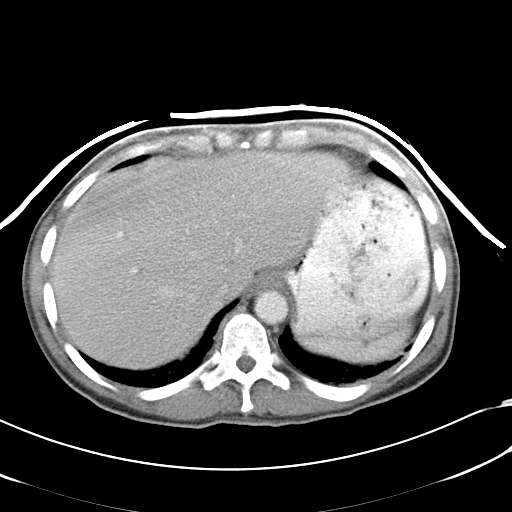
[im 81/96  soft-tissue]
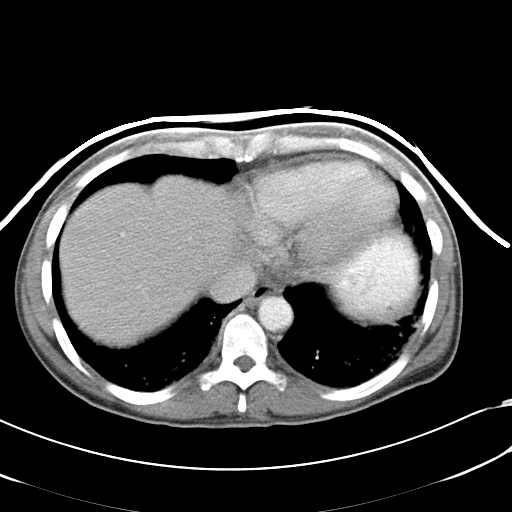
[im 91/96  soft-tissue]
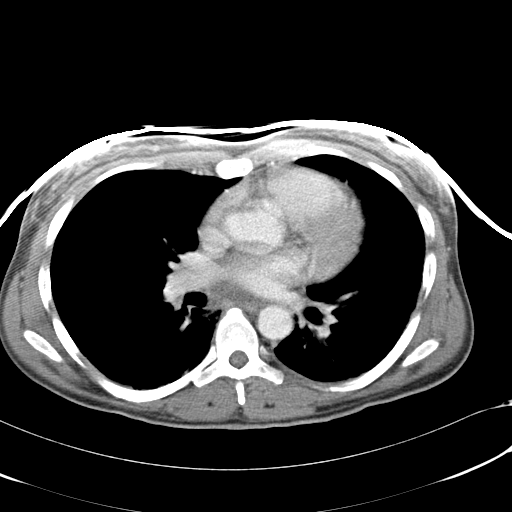

[Series 602: coronal · coronal · 0.97mm/px · 3 of 98 slices shown]
[im 33/98  soft-tissue]
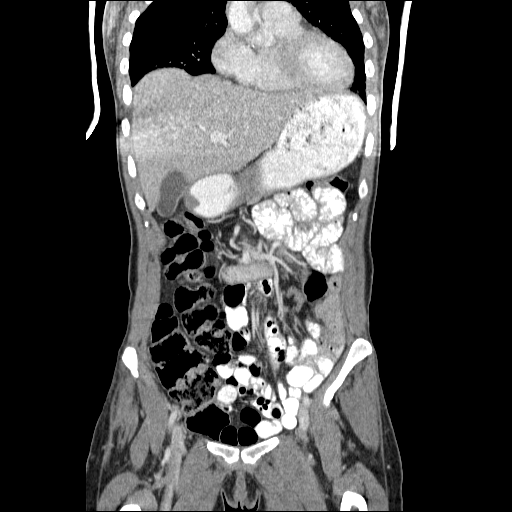
[im 44/98  soft-tissue]
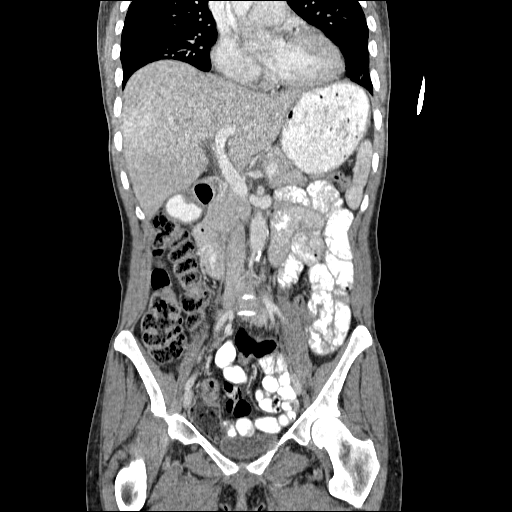
[im 54/98  soft-tissue]
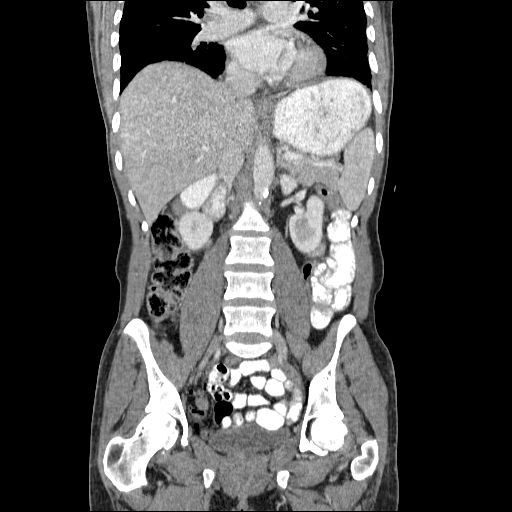

[17 of 46 positions shown; findings below may reference images not displayed]

FINDINGS: Lower lung volumes.  Increased dependent and patchy
bibasilar opacity.  No consolidation.  No pericardial or pleural
effusion. No acute osseous abnormality identified.  Trace pelvic
free fluid, decreased from prior.  Redundant sigmoid colon.  Oral
contrast has not yet reached the distal small bowel.  Stable,
normal appendix.  Intermittent gas and stool distending the colon.
Bladder is unremarkable.  No abnormal small bowel loop identified.
Stomach and duodenum within normal limits.  Liver, gallbladder,
spleen, pancreas, adrenal glands, kidneys, and portal venous system
within normal limits.  Major arterial structures are patent.
Intermittent atherosclerosis.  No abdominal free fluid.  No
abdominal hernia.  No lymphadenopathy.
IMPRESSION: 1.  Normal appendix and stable CT of the abdomen pelvis without
acute finding identified.
2.  Lower lung volumes with increased bibasilar pulmonary opacity.
Favor atelectasis, but viral / atypical infection not excluded.

## 2012-09-29 ENCOUNTER — Other Ambulatory Visit: Payer: Self-pay | Admitting: Family Medicine

## 2012-09-29 DIAGNOSIS — Z1231 Encounter for screening mammogram for malignant neoplasm of breast: Secondary | ICD-10-CM

## 2012-10-03 IMAGING — CR DG HAND COMPLETE 3+V*R*
4 series · 4 of 4 positions shown · non-contrast
Comparison: None.

CLINICAL DATA: Swelling at third metacarpal phalangeal joint

RIGHT HAND - COMPLETE 3+ VIEW

[x hand ap right]
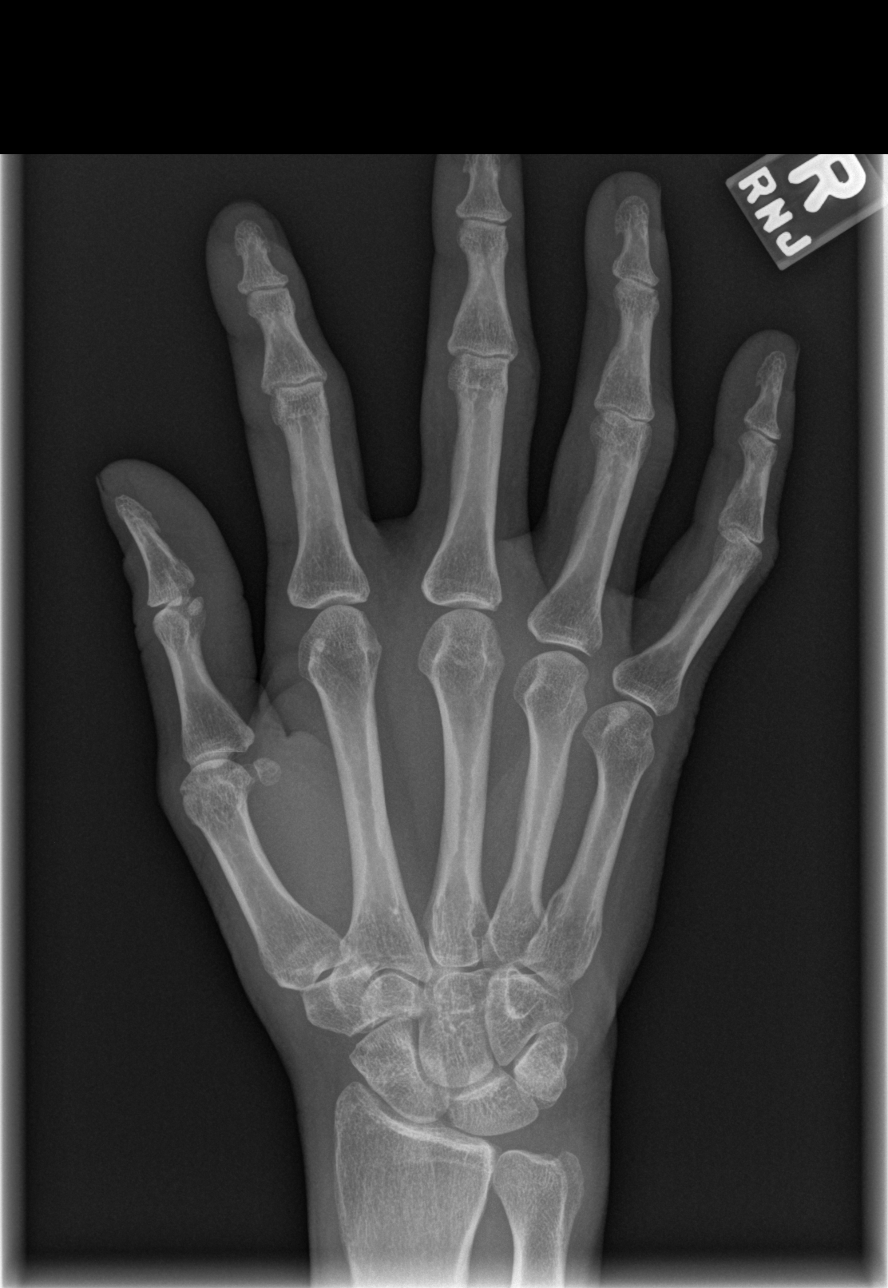

[x hand oblique right]
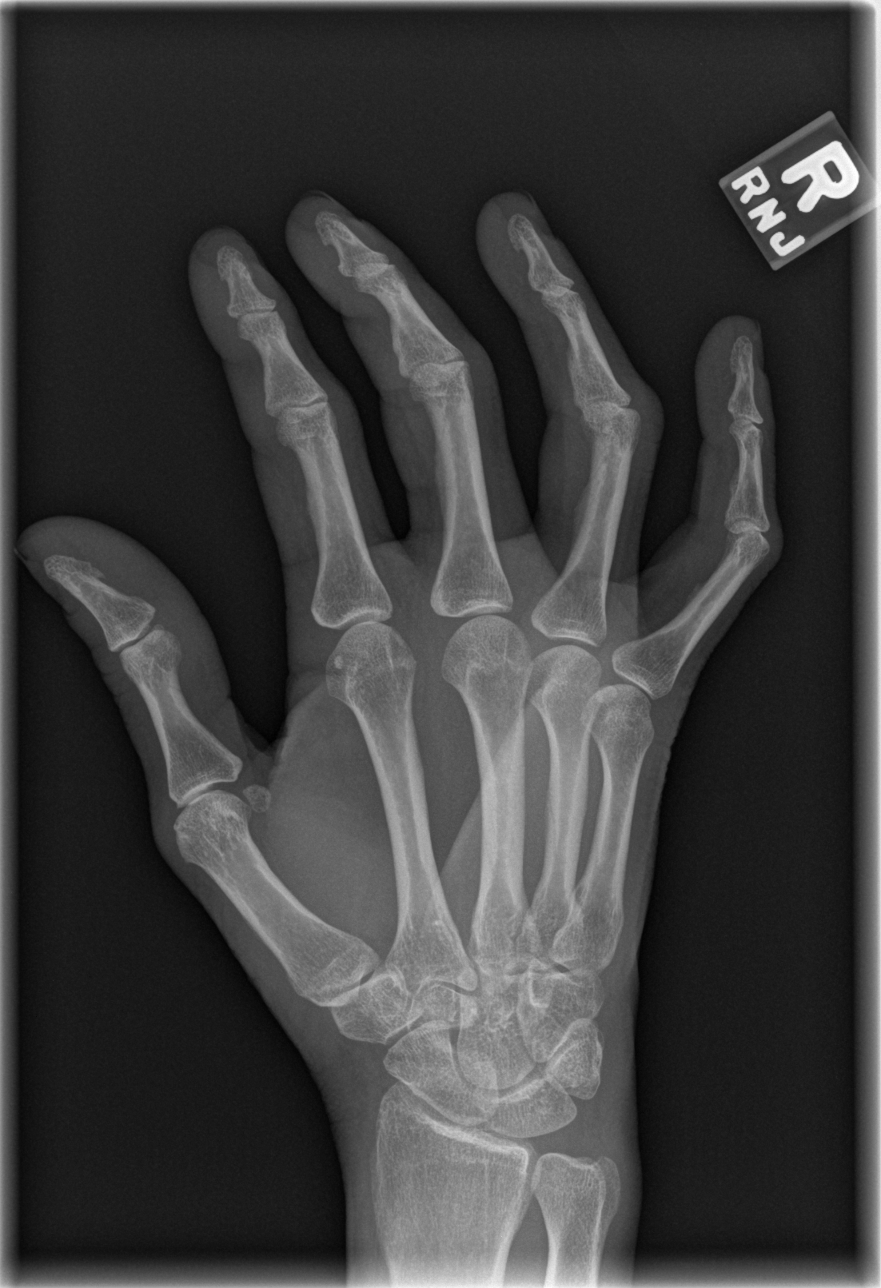

[x hand lat right (1 of 2)]
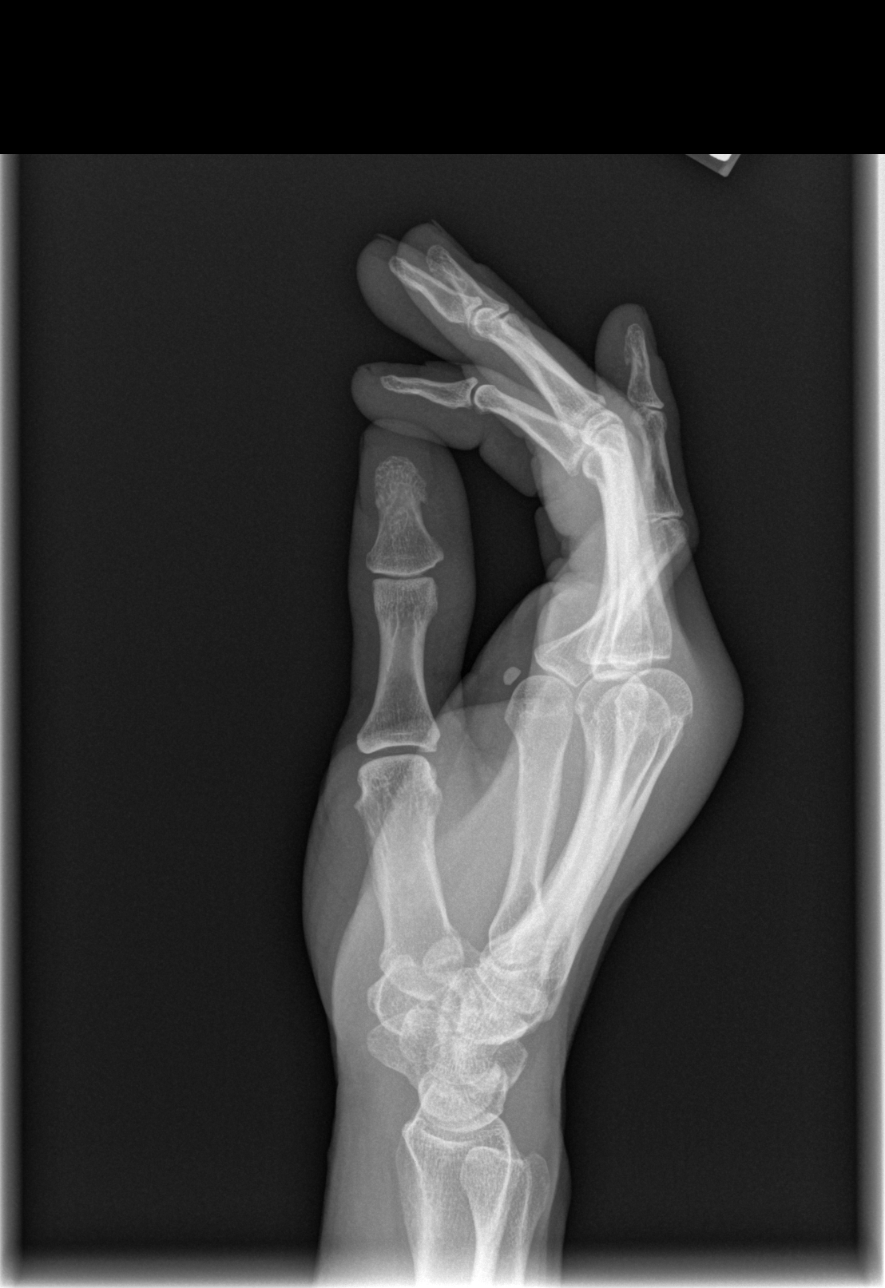

[x hand lat right (2 of 2)]
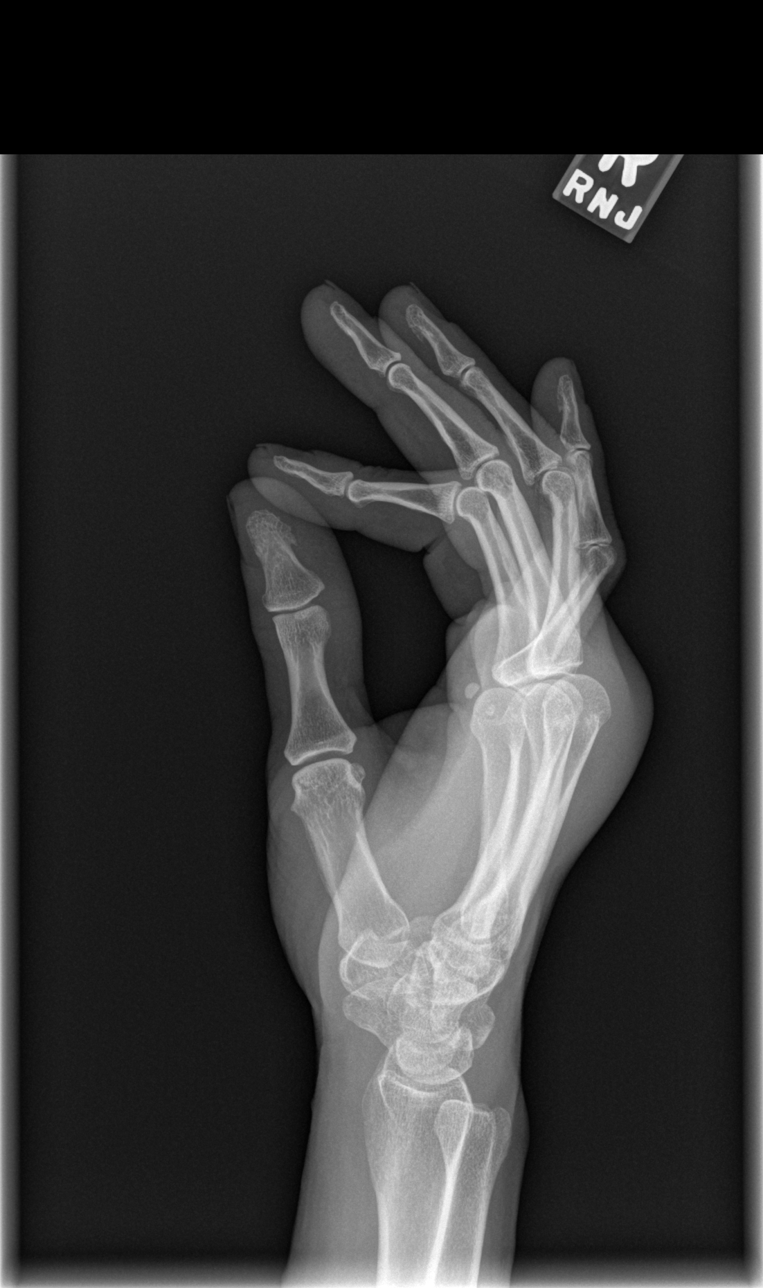

[4 of 4 positions shown; findings below may reference images not displayed]

FINDINGS: No acute fracture.  No dislocation.  Minimal degenerative
change of the IP joints.    Soft tissue swelling over the MCP joint
location at the dorsal aspect is noted.
IMPRESSION: No acute bony pathology.  Soft tissue swelling over the dorsum of
the distal hand is noted.

## 2012-10-05 DIAGNOSIS — F1914 Other psychoactive substance abuse with psychoactive substance-induced mood disorder: Secondary | ICD-10-CM

## 2012-10-05 HISTORY — DX: Other psychoactive substance abuse with psychoactive substance-induced mood disorder: F19.14

## 2012-10-12 IMAGING — CR DG CHEST 1V PORT
1 series · 1 of 1 positions shown · non-contrast
Comparison: Chest x-ray 10/12/2010.

CLINICAL DATA: Vomiting and chest pain.

PORTABLE CHEST - 1 VIEW

[AP]
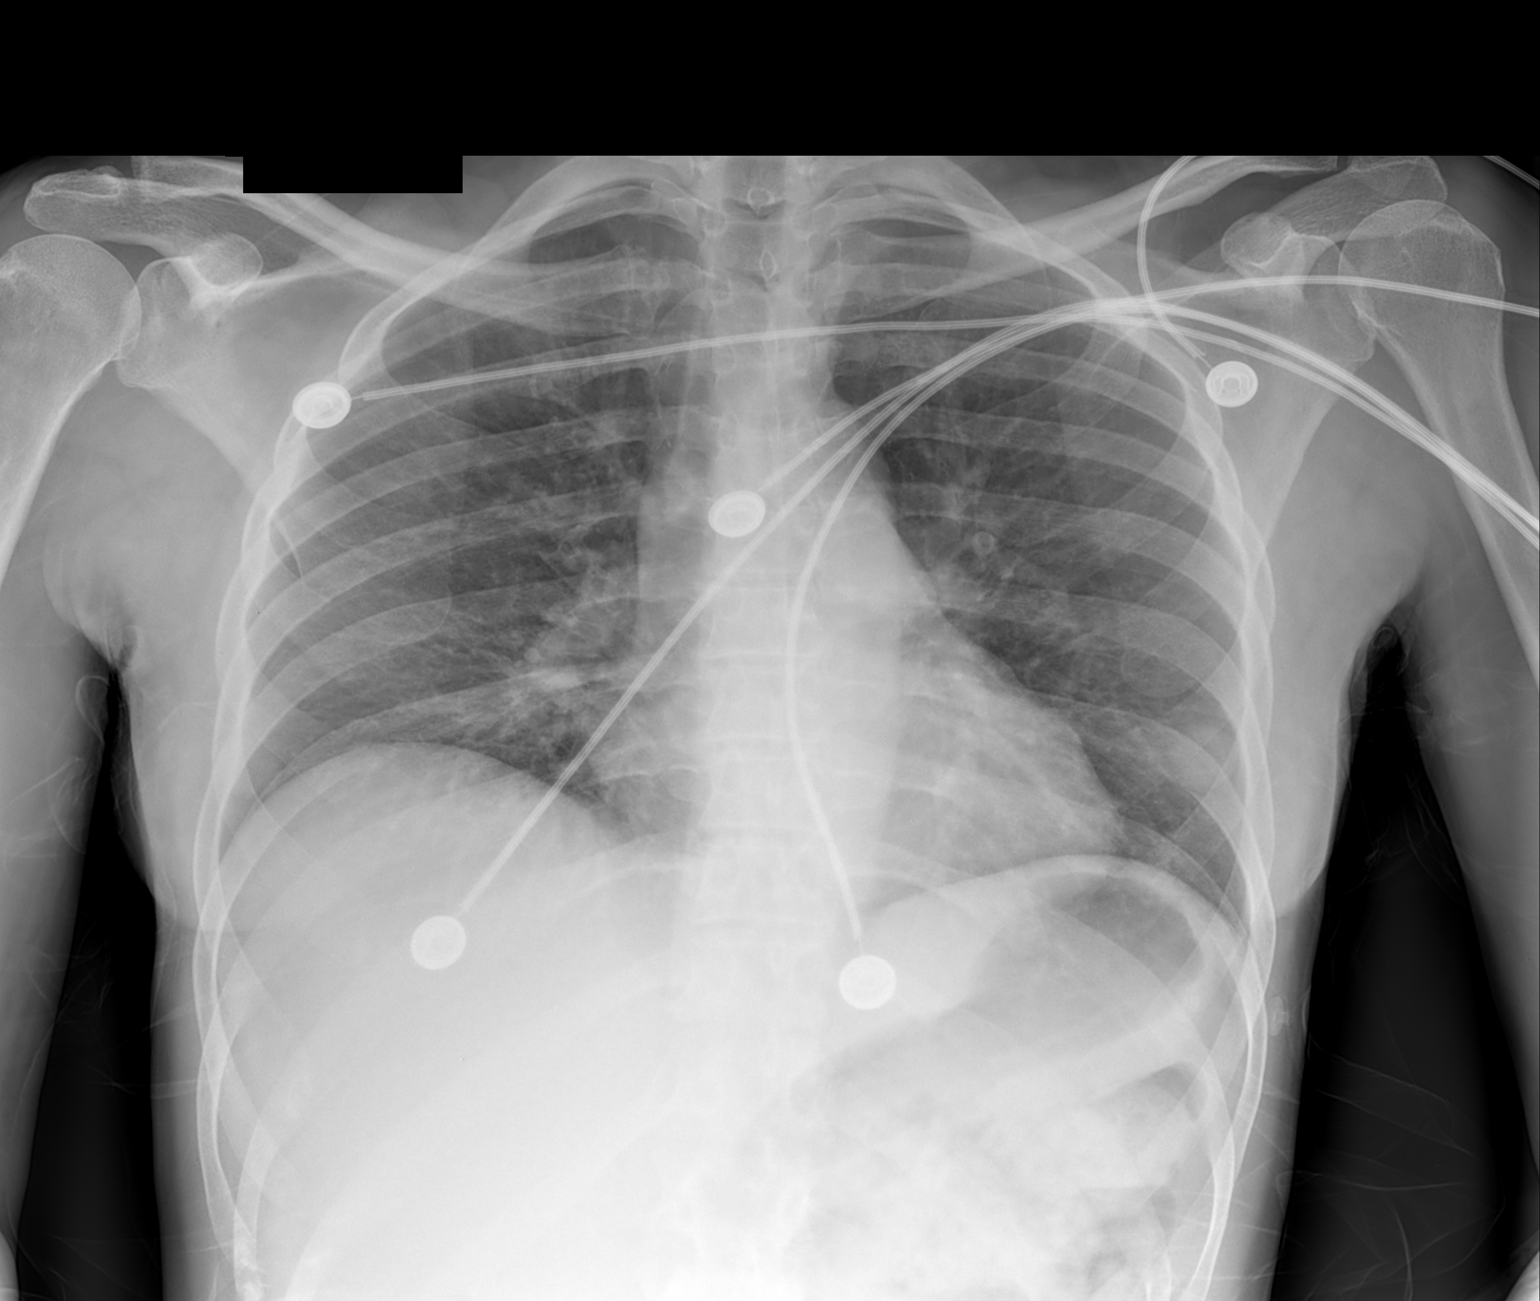

[1 of 1 positions shown; findings below may reference images not displayed]

FINDINGS: The cardiac silhouette, mediastinal and hilar contours
are within normal limits.  There are chronic bronchitic type
interstitial lung changes which could be related smoking or
reactive airways disease.  No infiltrates, edema or effusions.
Bilateral nipple shadows are noted.  The bony thorax intact.
IMPRESSION: 1.  Chronic-appearing bronchitic type interstitial lung changes
possibly related to smoking or reactive airways disease.
2.  No infiltrates, edema or effusions.

## 2012-10-12 IMAGING — CR DG HAND COMPLETE 3+V*R*
3 series · 3 of 3 positions shown · non-contrast
Comparison: 10/20/2010

CLINICAL DATA: Rheumatoid arthritis.  Hand swelling, pain.

RIGHT HAND - COMPLETE 3+ VIEW

[x hand pa right]
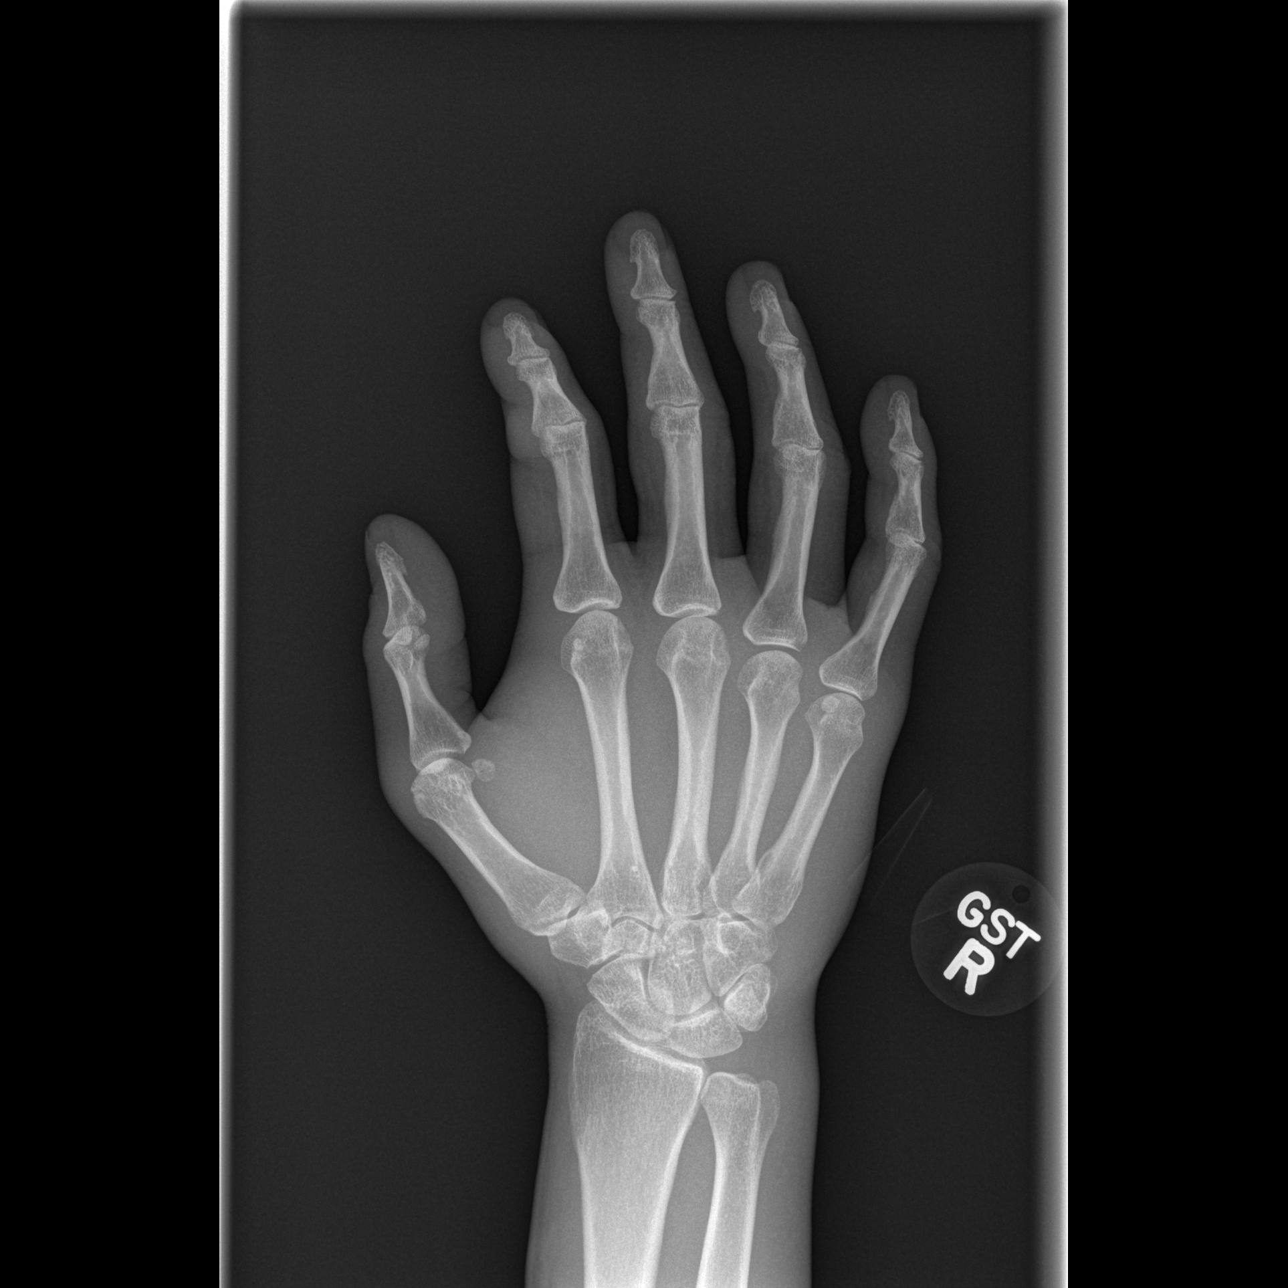

[x hand lat right (1 of 2)]
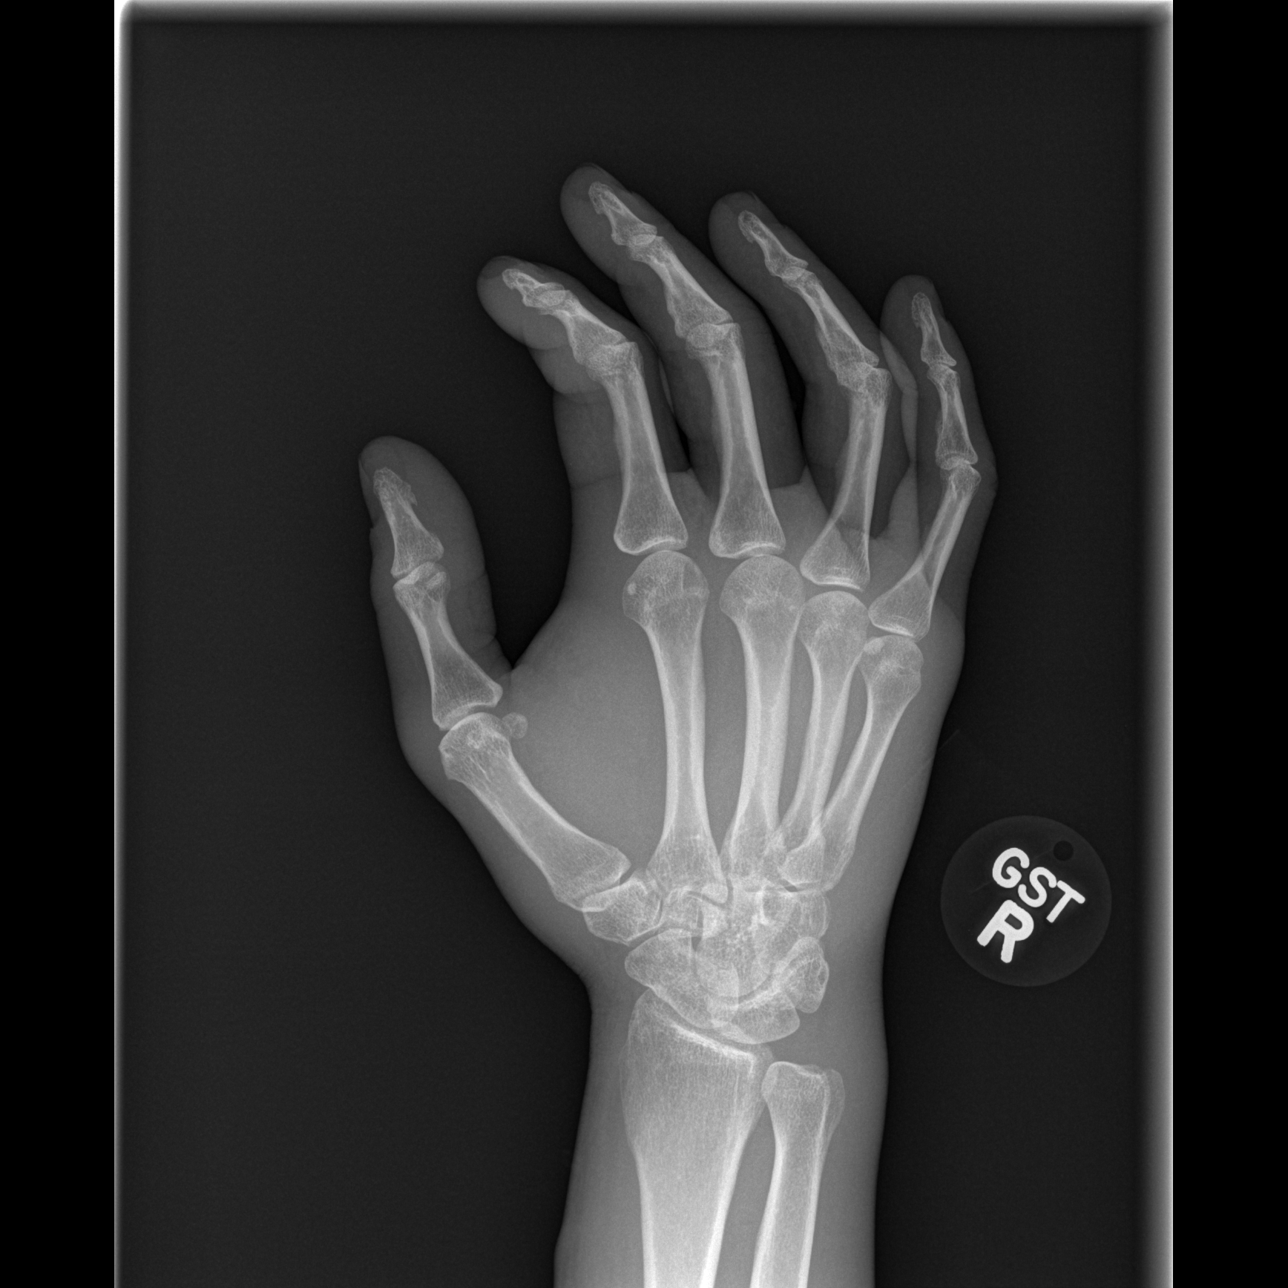

[x hand lat right (2 of 2)]
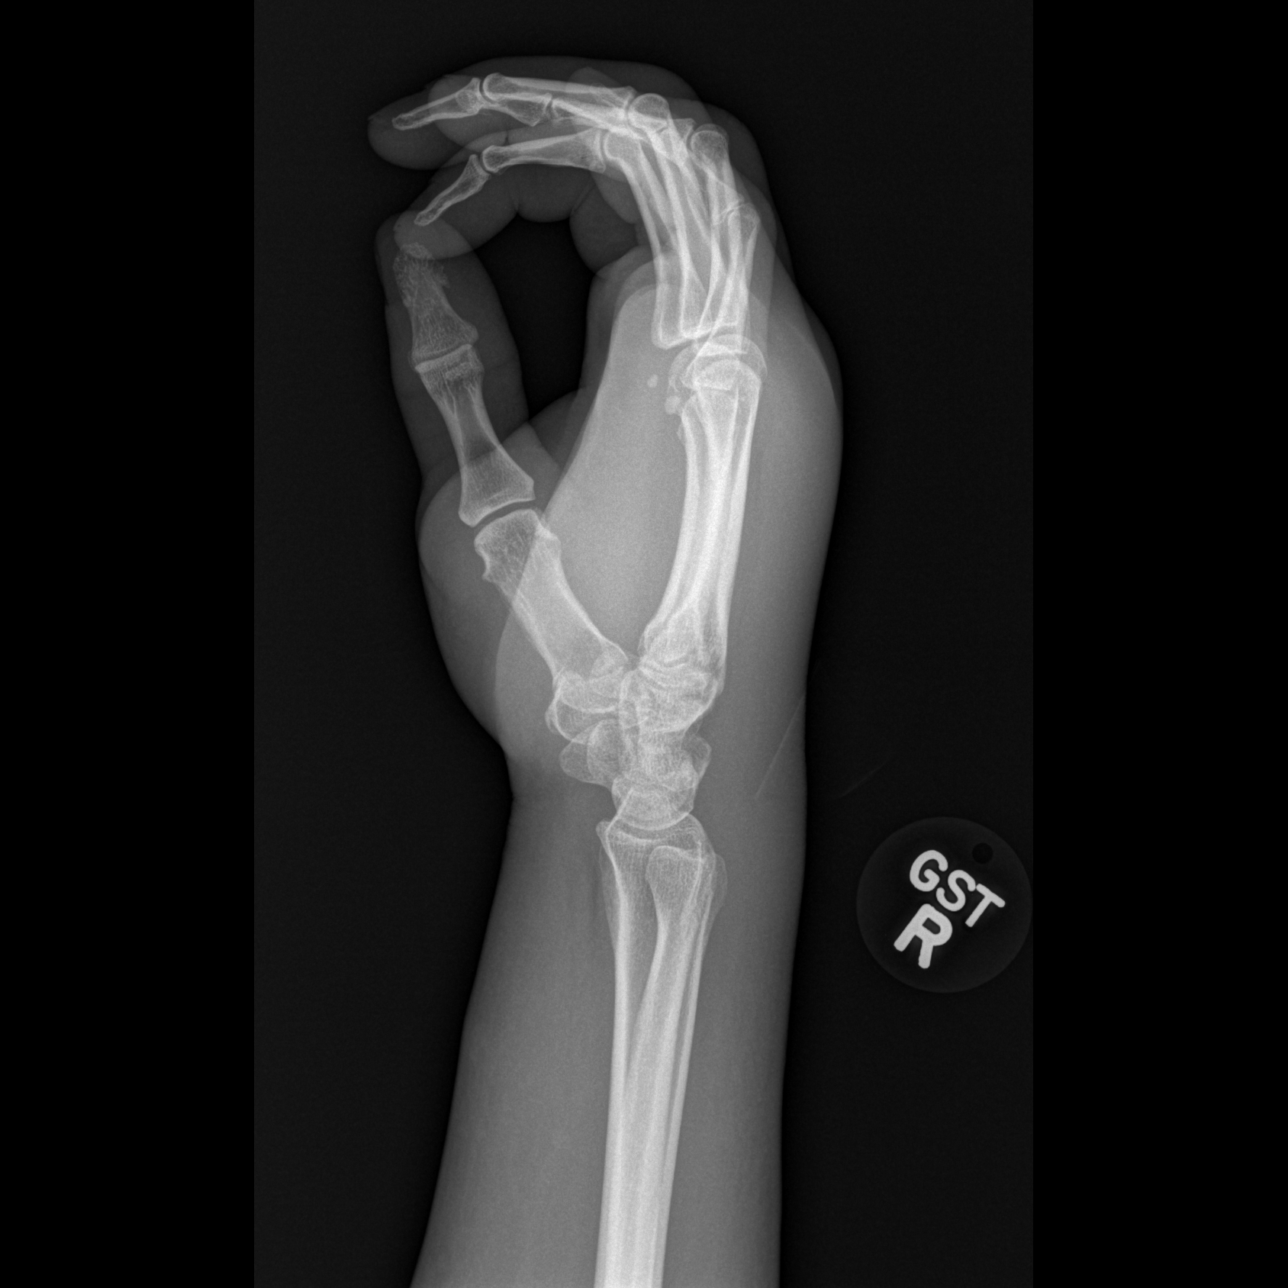

[3 of 3 positions shown; findings below may reference images not displayed]

FINDINGS: Progressive soft tissue swelling over the metacarpal
region. No acute bony abnormality.  Specifically, no fracture,
subluxation, or dislocation.  Soft tissues are intact.  Bone
mineralization is normal.  Joint spaces are maintained.  No bony
erosions.
IMPRESSION: Progressive hand soft tissue swelling.  No acute bony abnormality.

## 2012-10-28 ENCOUNTER — Ambulatory Visit (HOSPITAL_COMMUNITY)
Admission: RE | Admit: 2012-10-28 | Discharge: 2012-10-28 | Disposition: A | Discharge status: Home / Self Care | Payer: Medicaid Other | Source: Ambulatory Visit | Attending: Family Medicine | Admitting: Family Medicine

## 2012-10-28 DIAGNOSIS — Z1231 Encounter for screening mammogram for malignant neoplasm of breast: Secondary | ICD-10-CM | POA: Insufficient documentation

## 2012-11-25 ENCOUNTER — Other Ambulatory Visit: Payer: Self-pay | Admitting: *Deleted

## 2012-11-27 IMAGING — CR DG CHEST 1V PORT
1 series · 1 of 1 positions shown · non-contrast
Comparison: Chest radiograph performed 10/29/2010

CLINICAL DATA: Sore throat, cough and fever.

PORTABLE CHEST - 1 VIEW

[view not recorded]
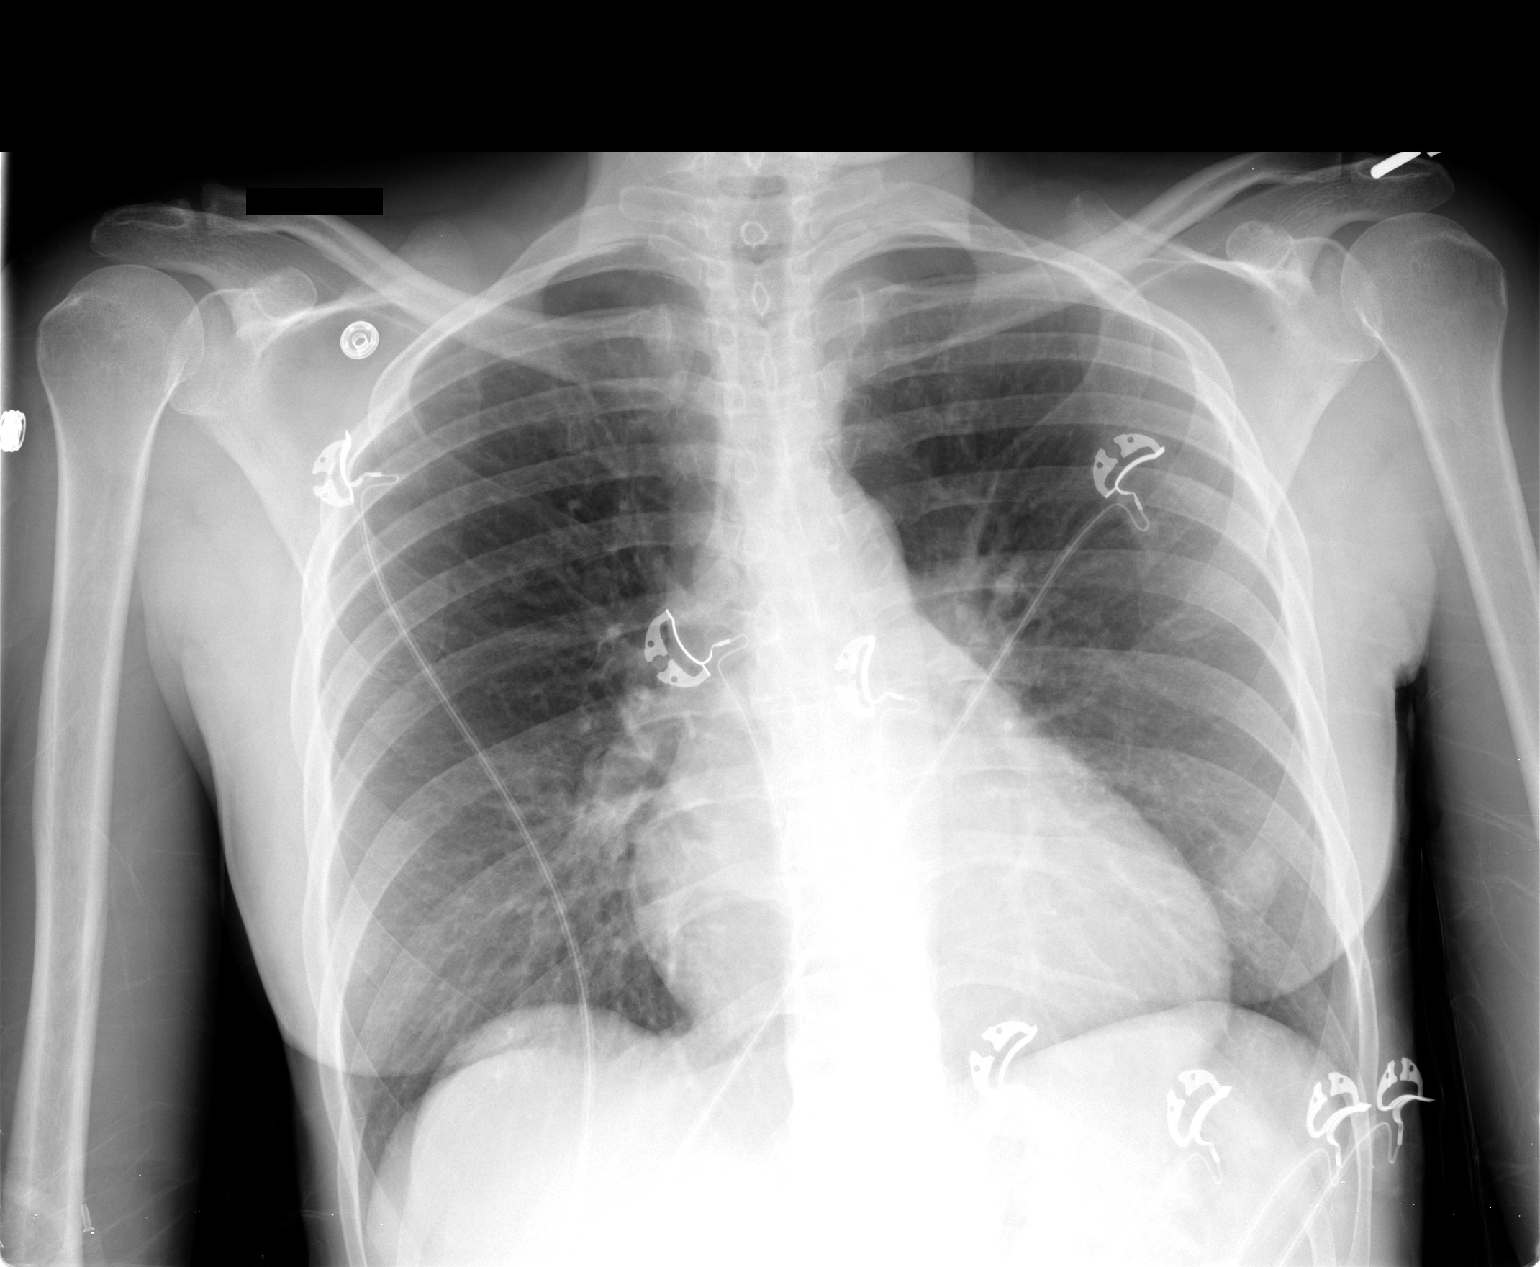

[1 of 1 positions shown; findings below may reference images not displayed]

FINDINGS: The lungs are well-aerated. Previously noted interstitial
changes have improved.  There is no evidence of focal
opacification, pleural effusion or pneumothorax.

The cardiomediastinal silhouette is within normal limits. Mild left
hilar fullness is thought to reflect normal vasculature in
different projection, given the recent nature of prior studies.  No
acute osseous abnormalities are seen.
IMPRESSION: No acute cardiopulmonary process seen.

## 2012-11-27 MED ORDER — FLUOXETINE HCL 20 MG PO CAPS: 40.0000 mg | ORAL_CAPSULE | Freq: Every day | ORAL | Status: DC

## 2012-12-28 ENCOUNTER — Emergency Department (HOSPITAL_COMMUNITY)
Admission: EM | Admit: 2012-12-28 | Discharge: 2012-12-28 | Disposition: A | Discharge status: Home / Self Care | Payer: Medicaid Other | Attending: Emergency Medicine | Admitting: Emergency Medicine

## 2012-12-28 ENCOUNTER — Encounter (HOSPITAL_COMMUNITY): Payer: Self-pay | Admitting: Emergency Medicine

## 2012-12-28 DIAGNOSIS — F3289 Other specified depressive episodes: Secondary | ICD-10-CM | POA: Insufficient documentation

## 2012-12-28 DIAGNOSIS — Z8679 Personal history of other diseases of the circulatory system: Secondary | ICD-10-CM | POA: Insufficient documentation

## 2012-12-28 DIAGNOSIS — Z7982 Long term (current) use of aspirin: Secondary | ICD-10-CM | POA: Insufficient documentation

## 2012-12-28 DIAGNOSIS — T783XXA Angioneurotic edema, initial encounter: Secondary | ICD-10-CM

## 2012-12-28 DIAGNOSIS — H5789 Other specified disorders of eye and adnexa: Secondary | ICD-10-CM | POA: Insufficient documentation

## 2012-12-28 DIAGNOSIS — Z8739 Personal history of other diseases of the musculoskeletal system and connective tissue: Secondary | ICD-10-CM | POA: Insufficient documentation

## 2012-12-28 DIAGNOSIS — F1410 Cocaine abuse, uncomplicated: Secondary | ICD-10-CM | POA: Insufficient documentation

## 2012-12-28 DIAGNOSIS — I10 Essential (primary) hypertension: Secondary | ICD-10-CM | POA: Insufficient documentation

## 2012-12-28 DIAGNOSIS — Z79899 Other long term (current) drug therapy: Secondary | ICD-10-CM | POA: Insufficient documentation

## 2012-12-28 DIAGNOSIS — Z862 Personal history of diseases of the blood and blood-forming organs and certain disorders involving the immune mechanism: Secondary | ICD-10-CM | POA: Insufficient documentation

## 2012-12-28 DIAGNOSIS — T4995XA Adverse effect of unspecified topical agent, initial encounter: Secondary | ICD-10-CM | POA: Insufficient documentation

## 2012-12-28 DIAGNOSIS — F17200 Nicotine dependence, unspecified, uncomplicated: Secondary | ICD-10-CM | POA: Insufficient documentation

## 2012-12-28 DIAGNOSIS — F329 Major depressive disorder, single episode, unspecified: Secondary | ICD-10-CM | POA: Insufficient documentation

## 2012-12-28 MED ORDER — PREDNISONE 10 MG PO TABS
ORAL_TABLET | ORAL | Status: DC
Start: 2012-12-28 — End: 2013-01-04

## 2012-12-28 NOTE — ED Provider Notes (Signed)
History     CSN: [id number]  Arrival date & time 12/28/12  1316   First MD Initiated Contact with Patient 12/28/12 1438      Chief Complaint  Patient presents with  . Facial Swelling    (Consider location/radiation/quality/duration/timing/severity/associated sxs/prior treatment) HPI Comments: Patient is a 52 y/o F with PMHx of HTN c/o swelling to left eye x 1 day. Patient reported that mild fluid accumulation started underneath left eye yesterday and has increased since then. Denied fever, eye pain, tearing, eye pruritis, recent illness, headache, abdominal pain, shortness of breathe, chest pain, difficulty breathing, being bite by a bug, trauma.   The history is provided by the patient. No language interpreter was used.    Past Medical History  Diagnosis Date  . 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 Followed by Dr. Louanne Skye  . Hypertension   . Cocaine abuse     ongoing with resultant vaculitis.  . Tobacco abuse   . Depression   . Normocytic anemia     BL Hgb 9.8-12. Last anemia panel 04/2010 - showing Fe 19, ferritin 101.  Pt on monthly B12 injections  . Rheumatoid arthritis     patient reported  . Inflammatory arthritis     Past Surgical History  Procedure Laterality Date  . Skin biopsy      bilateral shin nodules on 04/2010  . Hernia repair      Family History  Problem Relation Age of Onset  . Breast cancer Mother     Breast cancer  . Alcohol abuse Mother   . Colon cancer Maternal Aunt 27  . Alcohol abuse Father     History  Substance Use Topics  . Smoking status: Current Every Day Smoker -- 0.10 packs/day for 39 years    Types: Cigarettes  . Smokeless tobacco: Never Used     Comment: trying  . Alcohol Use: No    OB History   Grav Para Term Preterm Abortions TAB SAB Ect Mult Living                  Review of Systems  Constitutional: Negative for fever.  HENT: Positive for facial swelling. Negative for neck pain.        Swelling under left eye   Eyes: Negative for photophobia, pain, discharge, redness, itching and visual disturbance.  Respiratory: Negative for chest tightness and shortness of breath.   Cardiovascular: Negative for chest pain.  Gastrointestinal: Negative for nausea and abdominal pain.  Skin: Negative for color change.  Neurological: Negative for headaches.  10 Systems reviewed and are negative for acute change except as noted in the HPI.   Allergies  Acetaminophen  Home Medications   Current Outpatient Rx  Name  Route  Sig  Dispense  Refill  . amLODipine (NORVASC) 2.5 MG tablet   Oral   Take 1 tablet (2.5 mg total) by mouth daily.   30 tablet   5   . aspirin EC 81 MG EC tablet   Oral   Take 1 tablet (81 mg total) by mouth daily.   30 tablet   3   . diphenhydrAMINE (BENADRYL) 25 mg capsule   Oral   Take 25 mg by mouth every 6 (six) hours as needed. itching          . FLUoxetine (PROZAC) 20 MG capsule   Oral   Take 2 capsules (40 mg total) by mouth daily.   62 capsule   11   .  lisinopril (PRINIVIL,ZESTRIL) 20 MG tablet   Oral   Take 20 mg by mouth daily. For blood pressure          . Multiple Vitamin (MULTIVITAMIN WITH MINERALS) TABS   Oral   Take 1 tablet by mouth daily.         . vitamin B-12 (CYANOCOBALAMIN) 1000 MCG tablet   Oral   Take 1,000 mcg by mouth daily.           . predniSONE (DELTASONE) 10 MG tablet      Take 3 (three) tablets by mouth for 2 (two) days, take 2 (two) tablets by mouth for 2 (two) days, take 1 (one) tablet by mouth for 2 (two) days.   12 tablet   0   . traMADol (ULTRAM) 50 MG tablet   Oral   Take 50 mg by mouth every 6 (six) hours as needed. For pain           BP 115/70  Pulse 70  Temp(Src) 98.8 F (37.1 C) (Oral)  Resp 18  Ht 5' 1"  (1.549 m)  Wt 106 lb (48.081 kg)  BMI 20.04 kg/m2  SpO2 100%  Physical Exam  Nursing note and vitals reviewed. Constitutional: She is oriented to person, place, and time. She appears well-developed and  well-nourished. No distress.  HENT:  Head: Normocephalic and atraumatic.  Nose: Nose normal.  Mouth/Throat: Oropharynx is clear and moist. No oropharyngeal exudate.  Eyes: Conjunctivae and EOM are normal. Pupils are equal, round, and reactive to light. Right eye exhibits no discharge. Left eye exhibits no discharge.  No injection, tearing, discharge, inflammation, swelling to eyes b/l. Clear, non-erythematous, fluid-filled blister-formation along the lower orbital region of the left eye (along maxilla and zygomatic bone of left eye). No pain upon palpation to blister. No pain upon palpation to left eye.Transilluminate to blister formation. Visual field defects intact. Visual acuity intact.   Neck: Normal range of motion. Neck supple.  Cardiovascular: Normal rate, regular rhythm, normal heart sounds and intact distal pulses.  Exam reveals no friction rub.   No murmur heard. Pulmonary/Chest: Effort normal and breath sounds normal. She has no wheezes. She has no rales.  Abdominal: Soft. Bowel sounds are normal. She exhibits no distension. There is no tenderness.  Lymphadenopathy:    She has no cervical adenopathy.  Neurological: She is alert and oriented to person, place, and time.  Skin: Skin is warm and dry. She is not diaphoretic. No erythema.  Psychiatric: She has a normal mood and affect. Her behavior is normal.    ED Course  Procedures (including critical care time)  Labs Reviewed - No data to display No results found.   1. Angioedema, initial encounter   2. HTN (hypertension)       MDM  Patient is a 52 y/o F with PMHx of HTN presenting to the ED with a blister formation to the lower orbital region of the left eye that has gotten progressively larger over the past day. Patient denied pain, changes to vision, tearing, pruritis, discharge, inflammation, being bite by a bug, change in diet, shortness of breathe, difficulty breathing.   I personally evaluated and examined the  patient. PE: Alert and oriented. Left eye: clear, non-erythematous, fluid-filled blister formation along the lower orbital region of left eye (along maxilla and zygomatic bone of left eye). No pain upon palpation to left eye or blister. Transilluminate. Visual field and acuity intact.  Discussed case with Dr. Mylinda Latina who examined the patient -  reported case of angioedema.  Patient afebrile, normotensive, non-tachycardic, alert and oriented. Discharged patient - was in no acute distress, airway open. Patient denied shortness of breathe, difficulty breathing, chest pain. SpO2 100%. Patient aseptic and non-toxic appearing. Discharged patient with Prednisone tapering dose over a 6 day course: take 3 tablets for 2 days, take 2 tablets for 2 days, and take 1 tablet for 2 days - discussed with patient the medication course and she understood. Discussed with patient to follow-up with [facility] regarding event within 24 hours for re-check. Discussed with patient to continue taking home medications as prescribed. Discussed with patient to prop herself up at night with pillows to above heart level. Discussed with patient to not rub or irritate left eye. Discussed with patient that if symptoms are to worsen to please report back to the ED. Patient agreed to plan of care, understood, all questions answered.        Jamse Mead, PA-C 12/28/12 1640

## 2012-12-28 NOTE — ED Notes (Signed)
Pt arrives with left eye swelling. States began swelling yesterday, today continued to increase with swelling.  Denies pain. States at times with blurred vision.

## 2012-12-28 NOTE — ED Provider Notes (Signed)
Medical screening examination/treatment/procedure(s) were conducted as a shared visit with non-physician practitioner(s) and myself.  I personally evaluated the patient during the encounter 52 yo woman has developed puffy swelling in the left infraorbital region.  She is on lisinopril for HTN.  This may be a manifestation of angioedema.  Rx prednisone taper.  Where it does not affect breathing, do not feel it is necessary to take her off of lisinopril.  Mylinda Latina III, MD 12/28/12 2211

## 2012-12-28 NOTE — ED Notes (Signed)
Patient states "I slept all day yesterday and when I woke up, my eye was swollen underneath.   I went back to sleep again and it was worse".

## 2013-01-03 ENCOUNTER — Encounter: Payer: Self-pay | Admitting: Internal Medicine

## 2013-01-03 DIAGNOSIS — E785 Hyperlipidemia, unspecified: Secondary | ICD-10-CM | POA: Insufficient documentation

## 2013-01-03 DIAGNOSIS — Z720 Tobacco use: Secondary | ICD-10-CM | POA: Insufficient documentation

## 2013-01-04 ENCOUNTER — Emergency Department (HOSPITAL_COMMUNITY)
Admission: EM | Admit: 2013-01-04 | Discharge: 2013-01-04 | Disposition: A | Discharge status: Home / Self Care | Payer: Medicaid Other | Attending: Emergency Medicine | Admitting: Emergency Medicine

## 2013-01-04 DIAGNOSIS — F17200 Nicotine dependence, unspecified, uncomplicated: Secondary | ICD-10-CM | POA: Insufficient documentation

## 2013-01-04 DIAGNOSIS — Z7982 Long term (current) use of aspirin: Secondary | ICD-10-CM | POA: Insufficient documentation

## 2013-01-04 DIAGNOSIS — G8929 Other chronic pain: Secondary | ICD-10-CM | POA: Insufficient documentation

## 2013-01-04 DIAGNOSIS — F329 Major depressive disorder, single episode, unspecified: Secondary | ICD-10-CM | POA: Insufficient documentation

## 2013-01-04 DIAGNOSIS — F3289 Other specified depressive episodes: Secondary | ICD-10-CM | POA: Insufficient documentation

## 2013-01-04 DIAGNOSIS — Z862 Personal history of diseases of the blood and blood-forming organs and certain disorders involving the immune mechanism: Secondary | ICD-10-CM | POA: Insufficient documentation

## 2013-01-04 DIAGNOSIS — M069 Rheumatoid arthritis, unspecified: Secondary | ICD-10-CM | POA: Insufficient documentation

## 2013-01-04 DIAGNOSIS — F1410 Cocaine abuse, uncomplicated: Secondary | ICD-10-CM | POA: Insufficient documentation

## 2013-01-04 DIAGNOSIS — Z79899 Other long term (current) drug therapy: Secondary | ICD-10-CM | POA: Insufficient documentation

## 2013-01-04 DIAGNOSIS — I10 Essential (primary) hypertension: Secondary | ICD-10-CM | POA: Insufficient documentation

## 2013-01-04 DIAGNOSIS — M79609 Pain in unspecified limb: Secondary | ICD-10-CM | POA: Insufficient documentation

## 2013-01-04 DIAGNOSIS — Z8679 Personal history of other diseases of the circulatory system: Secondary | ICD-10-CM | POA: Insufficient documentation

## 2013-01-04 DIAGNOSIS — F1210 Cannabis abuse, uncomplicated: Secondary | ICD-10-CM | POA: Insufficient documentation

## 2013-01-04 NOTE — ED Notes (Signed)
Pt yelling very loud, st's "I want to go to [place], no one here will look at me, they don't know how I'm feeling."  This came after [facility] assessed pt and discharged pt.  Pt escorted out by security.

## 2013-01-04 NOTE — ED Provider Notes (Signed)
History     CSN: [id number]  Arrival date & time 01/04/13  0507   First MD Initiated Contact with Patient 01/04/13 0518      No chief complaint on file.    HPI The patient reports a burning sensation in her bilateral arms and feet.  She states this started today.  She has a long-standing history of cocaine induced vasculitis as well as rheumatoid and inflammatory arthritis.  The patient normally takes tramadol, Vicoprofen for her pain but states she has run out of these.  She felt well enough to walk to her friend's house today where she used cocaine.  She then walked back to her assisted-living center.  She asked the folks at the assisted living Center to have her sent to the emergency apartment for evaluation of her burning pain.  She denies fevers and chills.  No chest pain shortness of breath.  No nausea or vomiting or diarrhea.   Past Medical History  Diagnosis Date  . 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 Followed by Dr. Louanne Skye  . Hypertension   . Cocaine abuse     ongoing with resultant vaculitis.  . Tobacco abuse   . Depression   . Normocytic anemia     BL Hgb 9.8-12. Last anemia panel 04/2010 - showing Fe 19, ferritin 101.  Pt on monthly B12 injections  . Rheumatoid arthritis     patient reported  . Inflammatory arthritis     Past Surgical History  Procedure Laterality Date  . Skin biopsy      bilateral shin nodules on 04/2010  . Hernia repair      Family History  Problem Relation Age of Onset  . Breast cancer Mother     Breast cancer  . Alcohol abuse Mother   . Colon cancer Maternal Aunt 41  . Alcohol abuse Father     History  Substance Use Topics  . Smoking status: Current Every Day Smoker -- 0.10 packs/day for 39 years    Types: Cigarettes  . Smokeless tobacco: Never Used     Comment: trying  . Alcohol Use: No    OB History   Grav Para Term Preterm Abortions TAB SAB Ect Mult Living                  Review of Systems  All other systems  reviewed and are negative.    Allergies  Acetaminophen  Home Medications   Current Outpatient Rx  Name  Route  Sig  Dispense  Refill  . amLODipine (NORVASC) 2.5 MG tablet   Oral   Take 1 tablet (2.5 mg total) by mouth daily.   30 tablet   5   . aspirin EC 81 MG EC tablet   Oral   Take 1 tablet (81 mg total) by mouth daily.   30 tablet   3   . diphenhydrAMINE (BENADRYL) 25 mg capsule   Oral   Take 25 mg by mouth every 6 (six) hours as needed. itching          . FLUoxetine (PROZAC) 20 MG capsule   Oral   Take 2 capsules (40 mg total) by mouth daily.   62 capsule   11   . hydrocodone-ibuprofen (VICOPROFEN) 5-200 MG per tablet   Oral   Take 1 tablet by mouth every 8 (eight) hours as needed for pain.         . hydroxychloroquine (PLAQUENIL) 200 MG tablet   Oral  Take 200 mg by mouth daily.         Marland Kitchen lisinopril (PRINIVIL,ZESTRIL) 20 MG tablet   Oral   Take 20 mg by mouth daily. For blood pressure          . Multiple Vitamin (MULTIVITAMIN WITH MINERALS) TABS   Oral   Take 1 tablet by mouth daily.         . mupirocin ointment (BACTROBAN) 2 %   Topical   Apply 1 application topically 3 (three) times daily.         Marland Kitchen neomycin-bacitracin-polymyxin (NEOSPORIN) [phone] ointment   Topical   Apply 1 application topically 3 (three) times daily.         . traMADol (ULTRAM) 50 MG tablet   Oral   Take 50 mg by mouth every 6 (six) hours as needed. For pain         . vitamin B-12 (CYANOCOBALAMIN) 1000 MCG tablet   Oral   Take 1,000 mcg by mouth daily.           Marland Kitchen zolpidem (AMBIEN) 10 MG tablet   Oral   Take 10 mg by mouth at bedtime as needed for sleep.           BP 133/65  Pulse 85  Temp(Src) 98.2 F (36.8 C) (Oral)  Resp 18  SpO2 100%  Physical Exam  Nursing note and vitals reviewed. Constitutional: She is oriented to person, place, and time. She appears well-developed and well-nourished. No distress.  HENT:  Head: Normocephalic  and atraumatic.  Eyes: EOM are normal.  Neck: Normal range of motion.  Cardiovascular: Normal rate, regular rhythm and normal heart sounds.   Pulmonary/Chest: Effort normal and breath sounds normal.  Abdominal: Soft. She exhibits no distension. There is no tenderness.  Musculoskeletal: Normal range of motion.  Chronic changes of rheumatoid arthritis of her bilateral arms and feet.  Neurological: She is alert and oriented to person, place, and time.  Skin: Skin is warm and dry.  Psychiatric: She has a normal mood and affect. Judgment normal.    ED Course  Procedures (including critical care time)  Labs Reviewed - No data to display No results found.   1. Chronic pain       MDM  This appears to be exacerbation of her chronic pain.  She is out of her pain medications.  I recommended followup with her primary care physician.  I expressed the importance of a single provider providing narcotic medications.        Hoy Morn, MD 01/04/13 [phone]

## 2013-01-04 NOTE — ED Notes (Signed)
Per EMS:  Pt from [facility], EMS was called due to minor wrist and feet swelling.  R wrist is swollen and both feet.  No deformities noted, pulses present, sensation present.  St's they are painful upon palpation.  St's elevating feet hasn't helped with swelling.

## 2013-02-18 ENCOUNTER — Emergency Department (HOSPITAL_COMMUNITY)
Admission: EM | Admit: 2013-02-18 | Discharge: 2013-02-18 | Disposition: A | Discharge status: Home / Self Care | Payer: Medicaid Other | Attending: Emergency Medicine | Admitting: Emergency Medicine

## 2013-02-18 ENCOUNTER — Encounter (HOSPITAL_COMMUNITY): Payer: Self-pay | Admitting: Nurse Practitioner

## 2013-02-18 ENCOUNTER — Emergency Department (HOSPITAL_COMMUNITY): Payer: Medicaid Other

## 2013-02-18 DIAGNOSIS — T43695A Adverse effect of other psychostimulants, initial encounter: Secondary | ICD-10-CM | POA: Insufficient documentation

## 2013-02-18 DIAGNOSIS — Z862 Personal history of diseases of the blood and blood-forming organs and certain disorders involving the immune mechanism: Secondary | ICD-10-CM | POA: Insufficient documentation

## 2013-02-18 DIAGNOSIS — F14988 Cocaine use, unspecified with other cocaine-induced disorder: Secondary | ICD-10-CM

## 2013-02-18 DIAGNOSIS — T451X5A Adverse effect of antineoplastic and immunosuppressive drugs, initial encounter: Secondary | ICD-10-CM | POA: Insufficient documentation

## 2013-02-18 DIAGNOSIS — T451X4A Poisoning by antineoplastic and immunosuppressive drugs, undetermined, initial encounter: Secondary | ICD-10-CM | POA: Insufficient documentation

## 2013-02-18 DIAGNOSIS — R05 Cough: Secondary | ICD-10-CM | POA: Insufficient documentation

## 2013-02-18 DIAGNOSIS — F329 Major depressive disorder, single episode, unspecified: Secondary | ICD-10-CM | POA: Insufficient documentation

## 2013-02-18 DIAGNOSIS — Z79899 Other long term (current) drug therapy: Secondary | ICD-10-CM | POA: Insufficient documentation

## 2013-02-18 DIAGNOSIS — T405X1A Poisoning by cocaine, accidental (unintentional), initial encounter: Secondary | ICD-10-CM | POA: Insufficient documentation

## 2013-02-18 DIAGNOSIS — F17200 Nicotine dependence, unspecified, uncomplicated: Secondary | ICD-10-CM | POA: Insufficient documentation

## 2013-02-18 DIAGNOSIS — R059 Cough, unspecified: Secondary | ICD-10-CM | POA: Insufficient documentation

## 2013-02-18 DIAGNOSIS — H938X9 Other specified disorders of ear, unspecified ear: Secondary | ICD-10-CM | POA: Insufficient documentation

## 2013-02-18 DIAGNOSIS — I776 Arteritis, unspecified: Secondary | ICD-10-CM | POA: Insufficient documentation

## 2013-02-18 DIAGNOSIS — Z7982 Long term (current) use of aspirin: Secondary | ICD-10-CM | POA: Insufficient documentation

## 2013-02-18 DIAGNOSIS — Z8739 Personal history of other diseases of the musculoskeletal system and connective tissue: Secondary | ICD-10-CM | POA: Insufficient documentation

## 2013-02-18 DIAGNOSIS — R319 Hematuria, unspecified: Secondary | ICD-10-CM | POA: Insufficient documentation

## 2013-02-18 DIAGNOSIS — I10 Essential (primary) hypertension: Secondary | ICD-10-CM | POA: Insufficient documentation

## 2013-02-18 DIAGNOSIS — F1410 Cocaine abuse, uncomplicated: Secondary | ICD-10-CM | POA: Insufficient documentation

## 2013-02-18 DIAGNOSIS — F3289 Other specified depressive episodes: Secondary | ICD-10-CM | POA: Insufficient documentation

## 2013-02-18 DIAGNOSIS — H9209 Otalgia, unspecified ear: Secondary | ICD-10-CM | POA: Insufficient documentation

## 2013-02-18 LAB — URINALYSIS, ROUTINE W REFLEX MICROSCOPIC
Bilirubin Urine: NEGATIVE
Ketones, ur: NEGATIVE mg/dL
Protein, ur: NEGATIVE mg/dL
Urobilinogen, UA: 4 mg/dL — ABNORMAL HIGH (ref 0.0–1.0)

## 2013-02-18 LAB — CBC WITH DIFFERENTIAL/PLATELET
Basophils Absolute: 0 10*3/uL (ref 0.0–0.1)
Eosinophils Absolute: 0.1 10*3/uL (ref 0.0–0.7)
Eosinophils Relative: 5 % (ref 0–5)
HCT: 33.8 % — ABNORMAL LOW (ref 36.0–46.0)
Lymphocytes Relative: 28 % (ref 12–46)
Lymphs Abs: 0.8 10*3/uL (ref 0.7–4.0)
MCH: 29.4 pg (ref 26.0–34.0)
MCV: 84.1 fL (ref 78.0–100.0)
Monocytes Absolute: 0.1 10*3/uL (ref 0.1–1.0)
RDW: 13.2 % (ref 11.5–15.5)
WBC: 2.7 10*3/uL — ABNORMAL LOW (ref 4.0–10.5)

## 2013-02-18 LAB — BASIC METABOLIC PANEL
CO2: 24 mEq/L (ref 19–32)
Calcium: 9.3 mg/dL (ref 8.4–10.5)
Creatinine, Ser: 0.63 mg/dL (ref 0.50–1.10)
GFR calc non Af Amer: >90 mL/min (ref >90)
Glucose, Bld: 100 mg/dL — ABNORMAL HIGH (ref 70–99)

## 2013-02-18 MED ORDER — LORAZEPAM 2 MG/ML IJ SOLN
1.0000 mg | Freq: Once | INTRAMUSCULAR | Status: AC
  Administered 2013-02-18: 1 mg via INTRAVENOUS
  Filled 2013-02-18: qty 1

## 2013-02-18 MED ORDER — OXYCODONE HCL 5 MG PO TABS
5.0000 mg | ORAL_TABLET | Freq: Once | ORAL | Status: AC
  Administered 2013-02-18: 5 mg via ORAL
  Filled 2013-02-18: qty 1

## 2013-02-18 MED ORDER — ALBUTEROL SULFATE HFA 108 (90 BASE) MCG/ACT IN AERS: 1.0000 | INHALATION_SPRAY | Freq: Four times a day (QID) | RESPIRATORY_TRACT | Status: DC | PRN

## 2013-02-18 MED ORDER — HYDROCODONE-IBUPROFEN 7.5-200 MG PO TABS: 1.0000 | ORAL_TABLET | Freq: Four times a day (QID) | ORAL | Status: DC | PRN

## 2013-02-18 MED ORDER — DOXYCYCLINE HYCLATE 100 MG PO CAPS: 100.0000 mg | ORAL_CAPSULE | Freq: Two times a day (BID) | ORAL | Status: DC

## 2013-02-18 MED ORDER — PREDNISONE 20 MG PO TABS
60.0000 mg | ORAL_TABLET | Freq: Once | ORAL | Status: AC
  Administered 2013-02-18: 60 mg via ORAL
  Filled 2013-02-18: qty 3

## 2013-02-18 MED ORDER — PREDNISONE 20 MG PO TABS: ORAL_TABLET | ORAL | Status: DC

## 2013-02-18 MED ORDER — POTASSIUM CHLORIDE CRYS ER 20 MEQ PO TBCR
20.0000 meq | EXTENDED_RELEASE_TABLET | Freq: Once | ORAL | Status: AC
  Administered 2013-02-18: 20 meq via ORAL
  Filled 2013-02-18: qty 1

## 2013-02-18 NOTE — ED Notes (Signed)
MD at bedside. 

## 2013-02-18 NOTE — ED Notes (Addendum)
Per EMS:  Pt started having facial and ear swelling accompanied with a rash last night around 11pm.  This morning, pt noticed swelling in bilateral hands and forearms as well as above the ankles bilaterally.  Complains of hematuria.    Pt has been in sun for the last few days, except for being out in the rain a couple of days ago for which the coughing started accompanied with chest tightness.  Per Gerald Stabs, NT:  Pt admitted to using cocaine a couple of days ago after a friend died.  HX: HTN, Anxiety  Pt was living in [facility] and moved out 3 weeks ago and has not been on Prozac or any meds since then.  Pt currently living [place], [place].

## 2013-02-18 NOTE — ED Provider Notes (Signed)
History     CSN: [id number]  Arrival date & time 02/18/13  1000   First MD Initiated Contact with Patient 02/18/13 1011      Chief Complaint  Patient presents with  . Rash    face and ears swelling along with rash  . Cough    "tightness" with coughing  . Hematuria    (Consider location/radiation/quality/duration/timing/severity/associated sxs/prior treatment) HPI Comments: 52 year old female presents to the emergency department with chief complaint of bilateral ear pain.  She began having the pain last evening.  Pain associated with erythema and swelling.  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  She is a frequent emergency department visitor with chronic vasculitis and pain.  Patient states "my ears are acting up again".  She admits to using cocaine two days ago.  She has history of polysubstance abuse. Review of the chart shows that the patient has had similar presentation in the past.  In the past she has been started on Prednisone and Doxycycline.   Last night ears began swelling.   Pain is external. Similar to past instances.  Denies fevers, chills, myalgias, arthralgias, nausea, vomiting, diarrhea. She does report that she has had an occasional dry cough over the past couple of days.  She has not taken anything for symptoms prior to arrival.  Denies DOE, SOB, chest tightness or pressure, radiation to left arm, jaw or back, or diaphoresis. She also reports that she noticed a small amount of bright red blood in her urine earlier today.  Denies dysuria, flank pain, suprapubic pain, frequency, or urgency.  Denies headaches, light headedness, weakness, visual disturbances.   Patient is a 52 y.o. female presenting with rash, cough, and hematuria. The history is provided by the patient and medical records.  Rash Cough Associated symptoms: rash   Hematuria Associated symptoms include coughing and a rash.    Past Medical History   Diagnosis Date  . 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 Followed by Dr. Louanne Skye  . Hypertension   . Cocaine abuse     ongoing with resultant vaculitis.  . Tobacco abuse   . Depression   . Normocytic anemia     BL Hgb 9.8-12. Last anemia panel 04/2010 - showing Fe 19, ferritin 101.  Pt on monthly B12 injections  . Rheumatoid arthritis     patient reported  . Inflammatory arthritis     Past Surgical History  Procedure Laterality Date  . Skin biopsy      bilateral shin nodules on 04/2010  . Hernia repair      Family History  Problem Relation Age of Onset  . Breast cancer Mother     Breast cancer  . Alcohol abuse Mother   . Colon cancer Maternal Aunt 62  . Alcohol abuse Father     History  Substance Use Topics  . Smoking status: Current Every Day Smoker -- 0.10 packs/day for 39 years    Types: Cigarettes  . Smokeless tobacco: Never Used     Comment: trying  . Alcohol Use: No    OB History   Grav Para Term Preterm Abortions TAB SAB Ect Mult Living                  Review of Systems  Respiratory: Positive for cough.   Genitourinary: Positive for hematuria.  Skin: Positive for rash.  All other systems reviewed and are negative.    Allergies  Acetaminophen  Home Medications   Current Outpatient Rx  Name  Route  Sig  Dispense  Refill  . amLODipine (NORVASC) 2.5 MG tablet   Oral   Take 2.5 mg by mouth daily.         Marland Kitchen aspirin EC 81 MG tablet   Oral   Take 81 mg by mouth every evening.         . diphenhydrAMINE (BENADRYL) 25 mg capsule   Oral   Take 25 mg by mouth every 6 (six) hours as needed. itching          . FLUoxetine (PROZAC) 20 MG capsule   Oral   Take 2 capsules (40 mg total) by mouth daily.   62 capsule   11   . hydroxychloroquine (PLAQUENIL) 200 MG tablet   Oral   Take 200 mg by mouth daily.         Marland Kitchen lisinopril (PRINIVIL,ZESTRIL) 20 MG tablet   Oral   Take 20 mg by mouth daily. For blood pressure          . Multiple  Vitamin (MULTIVITAMIN WITH MINERALS) TABS   Oral   Take 1 tablet by mouth daily.         . mupirocin ointment (BACTROBAN) 2 %   Topical   Apply 1 application topically 3 (three) times daily.         Marland Kitchen neomycin-bacitracin-polymyxin (NEOSPORIN) [phone] ointment   Topical   Apply 1 application topically 3 (three) times daily.         . traMADol (ULTRAM) 50 MG tablet   Oral   Take 50 mg by mouth every 6 (six) hours as needed. For pain         . vitamin B-12 (CYANOCOBALAMIN) 1000 MCG tablet   Oral   Take 1,000 mcg by mouth daily.           Marland Kitchen zolpidem (AMBIEN) 10 MG tablet   Oral   Take 10 mg by mouth at bedtime as needed for sleep.           BP 186/82  Pulse 82  Temp(Src) 98.3 F (36.8 C) (Oral)  Resp 13  SpO2 100%  Physical Exam  Nursing note and vitals reviewed. Constitutional: She appears well-developed and well-nourished. No distress.  HENT:  Head: Normocephalic and atraumatic.  Erythema and swelling with small areas of dark discolation of the left ear and both cheeks.  Eyes: EOM are normal. Pupils are equal, round, and reactive to light.  Neck: Normal range of motion. Neck supple.  Cardiovascular: Normal rate, regular rhythm and normal heart sounds.   Pulmonary/Chest: Effort normal and breath sounds normal.  Abdominal: Soft. Bowel sounds are normal. She exhibits no distension and no mass. There is no tenderness. There is no rebound and no guarding.  Neurological: She is alert.  Skin: Skin is warm and dry. She is not diaphoretic.  Psychiatric: She has a normal mood and affect.    ED Course  Procedures (including critical care time)  Labs Reviewed  URINALYSIS, ROUTINE W REFLEX MICROSCOPIC   No results found.   No diagnosis found.    MDM  Patient with h/o significant cocaine use and lemivazole-induced vasculitis presenting with lesions of both cheeks and left ear.  She reports that she used Cocaine two days ago.  Review of the chart shows  that patient has had a similar presentation in the past, which has been treated with Prednisone and Doxycycline.  Patient discharged home with Doxycycline and  Prednisone.  Instructed to follow up with her PCP.  Return precautions given.        Sherlyn Lees [place], PA-C 02/20/13 1246

## 2013-02-18 NOTE — ED Notes (Signed)
YBN:[id number]<[place]Z> Expected date:02/18/13<BR> Expected time: 9:52 AM<BR> Means of arrival:Ambulance<BR> Comments:<BR> Cough, body swelling

## 2013-02-22 NOTE — ED Provider Notes (Signed)
Medical screening examination/treatment/procedure(s) were performed by non-physician practitioner and as supervising physician I was immediately available for consultation/collaboration.   Wandra Arthurs, MD 02/22/13 [phone]

## 2013-03-16 ENCOUNTER — Encounter: Payer: Self-pay | Admitting: Internal Medicine

## 2013-04-15 ENCOUNTER — Emergency Department (HOSPITAL_COMMUNITY)
Admission: EM | Admit: 2013-04-15 | Discharge: 2013-04-15 | Disposition: A | Discharge status: Home / Self Care | Payer: Medicaid Other | Attending: Emergency Medicine | Admitting: Emergency Medicine

## 2013-04-15 ENCOUNTER — Emergency Department (HOSPITAL_COMMUNITY): Payer: Medicaid Other

## 2013-04-15 ENCOUNTER — Encounter (HOSPITAL_COMMUNITY): Payer: Self-pay | Admitting: Emergency Medicine

## 2013-04-15 DIAGNOSIS — M25579 Pain in unspecified ankle and joints of unspecified foot: Secondary | ICD-10-CM | POA: Insufficient documentation

## 2013-04-15 DIAGNOSIS — F17200 Nicotine dependence, unspecified, uncomplicated: Secondary | ICD-10-CM | POA: Insufficient documentation

## 2013-04-15 DIAGNOSIS — Z862 Personal history of diseases of the blood and blood-forming organs and certain disorders involving the immune mechanism: Secondary | ICD-10-CM | POA: Insufficient documentation

## 2013-04-15 DIAGNOSIS — Z8679 Personal history of other diseases of the circulatory system: Secondary | ICD-10-CM | POA: Insufficient documentation

## 2013-04-15 DIAGNOSIS — Z8659 Personal history of other mental and behavioral disorders: Secondary | ICD-10-CM | POA: Insufficient documentation

## 2013-04-15 DIAGNOSIS — M79671 Pain in right foot: Secondary | ICD-10-CM

## 2013-04-15 DIAGNOSIS — Z8739 Personal history of other diseases of the musculoskeletal system and connective tissue: Secondary | ICD-10-CM | POA: Insufficient documentation

## 2013-04-15 DIAGNOSIS — I10 Essential (primary) hypertension: Secondary | ICD-10-CM | POA: Insufficient documentation

## 2013-04-15 DIAGNOSIS — R609 Edema, unspecified: Secondary | ICD-10-CM | POA: Insufficient documentation

## 2013-04-15 NOTE — ED Provider Notes (Signed)
History    CSN: [id number] Arrival date & time 04/15/13  0418  First MD Initiated Contact with Patient 04/15/13 0456     Chief Complaint  Patient presents with  . Foot Swelling   (Consider location/radiation/quality/duration/timing/severity/associated sxs/prior Treatment) HPI History provided by pt.   Pt c/o edema plantar surface bilateral forefoot x several days.  Painful.  No associated skin changes or fever.  Denies trauma but is homeless and walks frequently.   Past Medical History  Diagnosis Date  . 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 Followed by Dr. Louanne Skye  . Hypertension   . Cocaine abuse     ongoing with resultant vaculitis.  . Tobacco abuse   . Depression   . Normocytic anemia     BL Hgb 9.8-12. Last anemia panel 04/2010 - showing Fe 19, ferritin 101.  Pt on monthly B12 injections  . Rheumatoid arthritis(714.0)     patient reported  . Inflammatory arthritis    Past Surgical History  Procedure Laterality Date  . Skin biopsy      bilateral shin nodules on 04/2010  . Hernia repair     Family History  Problem Relation Age of Onset  . Breast cancer Mother     Breast cancer  . Alcohol abuse Mother   . Colon cancer Maternal Aunt 22  . Alcohol abuse Father    History  Substance Use Topics  . Smoking status: Current Every Day Smoker -- 0.10 packs/day for 39 years    Types: Cigarettes  . Smokeless tobacco: Never Used     Comment: trying  . Alcohol Use: No   OB History   Grav Para Term Preterm Abortions TAB SAB Ect Mult Living                 Review of Systems  All other systems reviewed and are negative.    Allergies  Acetaminophen  Home Medications  No current outpatient prescriptions on file. BP 151/86  Pulse 105  Temp(Src) 97.9 F (36.6 C) (Oral)  Resp 18  SpO2 96% Physical Exam  Nursing note and vitals reviewed. Constitutional: She is oriented to person, place, and time. She appears well-developed and well-nourished. No distress.   HENT:  Head: Normocephalic and atraumatic.  Eyes:  Normal appearance  Neck: Normal range of motion.  Pulmonary/Chest: Effort normal.  Musculoskeletal: Normal range of motion.  Mild edema plantar surface bilateral forefoot, worse on R.  No overlying erythema or rash.  Very ttp, particularly at base of 3rd and 4th right metatarsals.  Full ROM of toes.  Brisk cap refill.  Distal sensation intact.   Neurological: She is alert and oriented to person, place, and time.  Psychiatric: She has a normal mood and affect. Her behavior is normal.    ED Course  Procedures (including critical care time) Labs Reviewed - No data to display Dg Foot Complete Right  04/15/2013   *RADIOLOGY REPORT*  Clinical Data: Right foot pain and swelling.  No known injury. History rheumatoid arthritis.  RIGHT FOOT COMPLETE - 3+ VIEW  Comparison: 01/30/2010  Findings: The bone demineralization.  Hallux valgus deformity. Soft tissue swelling over the first and fifth metatarsal phalangeal joints.  No evidence of acute fracture or subluxation.  No focal bone lesion or bone destruction.  The cortex appears intact and there is no periosteal reaction.  No radiopaque soft tissue foreign bodies or gas collections.  Hallux valgus deformity is progressed since the previous study and soft tissue swelling  is increased.  IMPRESSION: Hallux valgus deformity.  Soft tissue swelling over the first and fifth metatarsal phalangeal joints.  No acute fracture or subluxation.   Original Report Authenticated By: Lucienne Capers, M.D.   1. Pain in right foot     MDM  [phone] homeless F presents w/ painful edema plantar surface, bilateral forefoot.  No fever, well-appearing, no skin changes, NV intact on exam.  Xray neg for fx.  Results discussed w/ pt.  Nursing staff provided her with right cam walker and I recommended ice, elevation and NSAID.  She requested a form so she can apply for a spot at [facility].  I referred her to [facility] and told her that she could try to come back to ED to see social work on Monday as well.  Return precautions discussed.   Remer Macho, PA-C 04/15/13 [phone]

## 2013-04-15 NOTE — ED Notes (Signed)
Pt also reported smoking 1/2 a dime of crack cocaine yesterday at 1700.

## 2013-04-15 NOTE — ED Notes (Signed)
Per [facility], pt has been panhandling and walks everywhere she needs to go. Pt c/o foot swelling, no swelling or injuries noted to [facility]. Pt stated she was dropped off and could not walk back to where she wanted to go d/t pain. Pt wanting to get back into [facility], but does not have the needed paper work.  Pt has been noncompliant with HTN meds, currently hypertensive 160/100.

## 2013-04-16 NOTE — ED Provider Notes (Signed)
Medical screening examination/treatment/procedure(s) were performed by non-physician practitioner and as supervising physician I was immediately available for consultation/collaboration.  Teressa Lower, MD 04/16/13 [phone]

## 2013-04-18 ENCOUNTER — Encounter (HOSPITAL_COMMUNITY): Payer: Self-pay

## 2013-04-18 ENCOUNTER — Emergency Department (HOSPITAL_COMMUNITY)
Admission: EM | Admit: 2013-04-18 | Discharge: 2013-04-18 | Disposition: A | Discharge status: Home / Self Care | Payer: Medicaid Other | Attending: Emergency Medicine | Admitting: Emergency Medicine

## 2013-04-18 DIAGNOSIS — Z8679 Personal history of other diseases of the circulatory system: Secondary | ICD-10-CM | POA: Insufficient documentation

## 2013-04-18 DIAGNOSIS — Z8739 Personal history of other diseases of the musculoskeletal system and connective tissue: Secondary | ICD-10-CM | POA: Insufficient documentation

## 2013-04-18 DIAGNOSIS — I10 Essential (primary) hypertension: Secondary | ICD-10-CM | POA: Insufficient documentation

## 2013-04-18 DIAGNOSIS — Z5900 Homelessness unspecified: Secondary | ICD-10-CM | POA: Insufficient documentation

## 2013-04-18 DIAGNOSIS — F1410 Cocaine abuse, uncomplicated: Secondary | ICD-10-CM | POA: Insufficient documentation

## 2013-04-18 DIAGNOSIS — Z8659 Personal history of other mental and behavioral disorders: Secondary | ICD-10-CM | POA: Insufficient documentation

## 2013-04-18 DIAGNOSIS — F17200 Nicotine dependence, unspecified, uncomplicated: Secondary | ICD-10-CM | POA: Insufficient documentation

## 2013-04-18 DIAGNOSIS — M722 Plantar fascial fibromatosis: Secondary | ICD-10-CM

## 2013-04-18 DIAGNOSIS — Z862 Personal history of diseases of the blood and blood-forming organs and certain disorders involving the immune mechanism: Secondary | ICD-10-CM | POA: Insufficient documentation

## 2013-04-18 DIAGNOSIS — F1210 Cannabis abuse, uncomplicated: Secondary | ICD-10-CM | POA: Insufficient documentation

## 2013-04-18 MED ORDER — IBUPROFEN 800 MG PO TABS: 800.0000 mg | ORAL_TABLET | Freq: Three times a day (TID) | ORAL | Status: DC

## 2013-04-18 MED ORDER — IBUPROFEN 800 MG PO TABS
800.0000 mg | ORAL_TABLET | Freq: Once | ORAL | Status: AC
  Administered 2013-04-18: 800 mg via ORAL
  Filled 2013-04-18: qty 1

## 2013-04-18 NOTE — ED Notes (Signed)
OER:QS12<KS> Expected date:<BR> Expected time:<BR> Means of arrival:<BR> Comments:<BR> EMS/ambulatory 52 yo female with feet swelling

## 2013-04-18 NOTE — ED Provider Notes (Signed)
History    CSN: [id number] Arrival date & time 04/18/13  0330  First MD Initiated Contact with Patient 04/18/13 [phone]     Chief Complaint  Patient presents with  . Leg Swelling   (Consider location/radiation/quality/duration/timing/severity/associated sxs/prior Treatment) HPI History provided by patient. Is currently homeless and has been walking a lot, complaining of right much more so than left bottom of foot pain. No trauma. Feels like there is some swelling. No redness or calf pain. History of same and recently had x-rays.   Patient is also requesting help to get into nursing home[facility] she was previously a resident.  Past Medical History  Diagnosis Date  . 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 Followed by Dr. Louanne Skye  . Hypertension   . Cocaine abuse     ongoing with resultant vaculitis.  . Tobacco abuse   . Depression   . Normocytic anemia     BL Hgb 9.8-12. Last anemia panel 04/2010 - showing Fe 19, ferritin 101.  Pt on monthly B12 injections  . Rheumatoid arthritis(714.0)     patient reported  . Inflammatory arthritis    Past Surgical History  Procedure Laterality Date  . Skin biopsy      bilateral shin nodules on 04/2010  . Hernia repair     Family History  Problem Relation Age of Onset  . Breast cancer Mother     Breast cancer  . Alcohol abuse Mother   . Colon cancer Maternal Aunt 41  . Alcohol abuse Father    History  Substance Use Topics  . Smoking status: Current Every Day Smoker -- 0.10 packs/day for 39 years    Types: Cigarettes  . Smokeless tobacco: Never Used     Comment: trying  . Alcohol Use: No   OB History   Grav Para Term Preterm Abortions TAB SAB Ect Mult Living                 Review of Systems  Constitutional: Negative for fever and chills.  HENT: Negative for neck pain and neck stiffness.   Eyes: Negative for pain.  Respiratory: Negative for shortness of breath.   Cardiovascular: Negative for chest pain.   Gastrointestinal: Negative for abdominal pain.  Genitourinary: Negative for flank pain.  Musculoskeletal: Negative for back pain.  Skin: Negative for rash.  Neurological: Negative for headaches.  All other systems reviewed and are negative.    Allergies  Acetaminophen  Home Medications   Current Outpatient Rx  Name  Route  Sig  Dispense  Refill  . ibuprofen (ADVIL,MOTRIN) 800 MG tablet   Oral   Take 1 tablet (800 mg total) by mouth 3 (three) times daily.   21 tablet   0    BP 138/69  Pulse 78  Temp(Src) 98.7 F (37.1 C) (Oral)  Resp 20  Ht 5' 1.5" (1.562 m)  Wt 100 lb (45.36 kg)  BMI 18.59 kg/m2  SpO2 100% Physical Exam  Constitutional: She is oriented to person, place, and time. She appears well-developed and well-nourished.  HENT:  Head: Normocephalic and atraumatic.  Eyes: EOM are normal. Pupils are equal, round, and reactive to light.  Neck: Neck supple.  Cardiovascular: Normal rate, regular rhythm and intact distal pulses.   Pulmonary/Chest: Effort normal and breath sounds normal. No respiratory distress.  Musculoskeletal: Normal range of motion. She exhibits no edema.  Right lower extremity with tenderness over the base of the heel with minimal swelling. No erythema. No  ulcers or lesions. No increased warmth to touch. Good range of motion throughout. Similar exam to the left heel, again without any lesion, erythema or increased warmth to touch. Distal neurovascular intact x4  Neurological: She is alert and oriented to person, place, and time.  Skin: Skin is warm and dry.    ED Course  Procedures (including critical care time)  Dg Foot Complete Right  04/15/2013   *RADIOLOGY REPORT*  Clinical Data: Right foot pain and swelling.  No known injury. History rheumatoid arthritis.  RIGHT FOOT COMPLETE - 3+ VIEW  Comparison: 01/30/2010  Findings: The bone demineralization.  Hallux valgus deformity. Soft tissue swelling over the first and fifth metatarsal phalangeal  joints.  No evidence of acute fracture or subluxation.  No focal bone lesion or bone destruction.  The cortex appears intact and there is no periosteal reaction.  No radiopaque soft tissue foreign bodies or gas collections.  Hallux valgus deformity is progressed since the previous study and soft tissue swelling is increased.  IMPRESSION: Hallux valgus deformity.  Soft tissue swelling over the first and fifth metatarsal phalangeal joints.  No acute fracture or subluxation.   Original Report Authenticated By: Lucienne Capers, M.D.   Ice and Motrin provided  1. Plantar fasciitis    Social work consult requested to help patient with request for nursing home  Plan discharge with plantar fasciitis instructions and precautions. Prescription for Motrin provided. Referral to the adult care center for indigent patients without primary care physician.   MDM  Right foot pain with clinical plantar fasciitis - treated for same  Previous records/right foot x-ray reviewed as above  Medication provided  Vital signs the nurse's notes reviewed and considered  Teressa Lower, MD 04/18/13 [phone]

## 2013-04-18 NOTE — ED Notes (Signed)
Breakfast tray has been ordered for patient

## 2013-04-18 NOTE — ED Notes (Signed)
Pt sleeping well, in no acute distress

## 2013-04-18 NOTE — ED Notes (Signed)
Pt complains of bilateral feet swelling for one month, pt currently homeless and stays on her feet all the time

## 2013-04-18 NOTE — ED Notes (Signed)
Pt will get a social work consult in the am, pt can sleep in rm 9 until then.

## 2013-04-18 NOTE — Progress Notes (Signed)
CSW provided patient with FL2 as she states that she plans to go to [facility]. CSW also provided patient with bus pass & shelter list. No other CSW needs identified. CSW signing off.   Winfred Leeds, [facility] Clinical Social Worker cell #: [phone]

## 2013-05-04 IMAGING — CR DG CHEST 2V
2 series · 2 of 2 positions shown · non-contrast
Comparison: 12/14/2010, 10/05/2010

CLINICAL DATA: Chest pain and vomiting since [REDACTED]

CHEST - 2 VIEW

[w chest pa *]
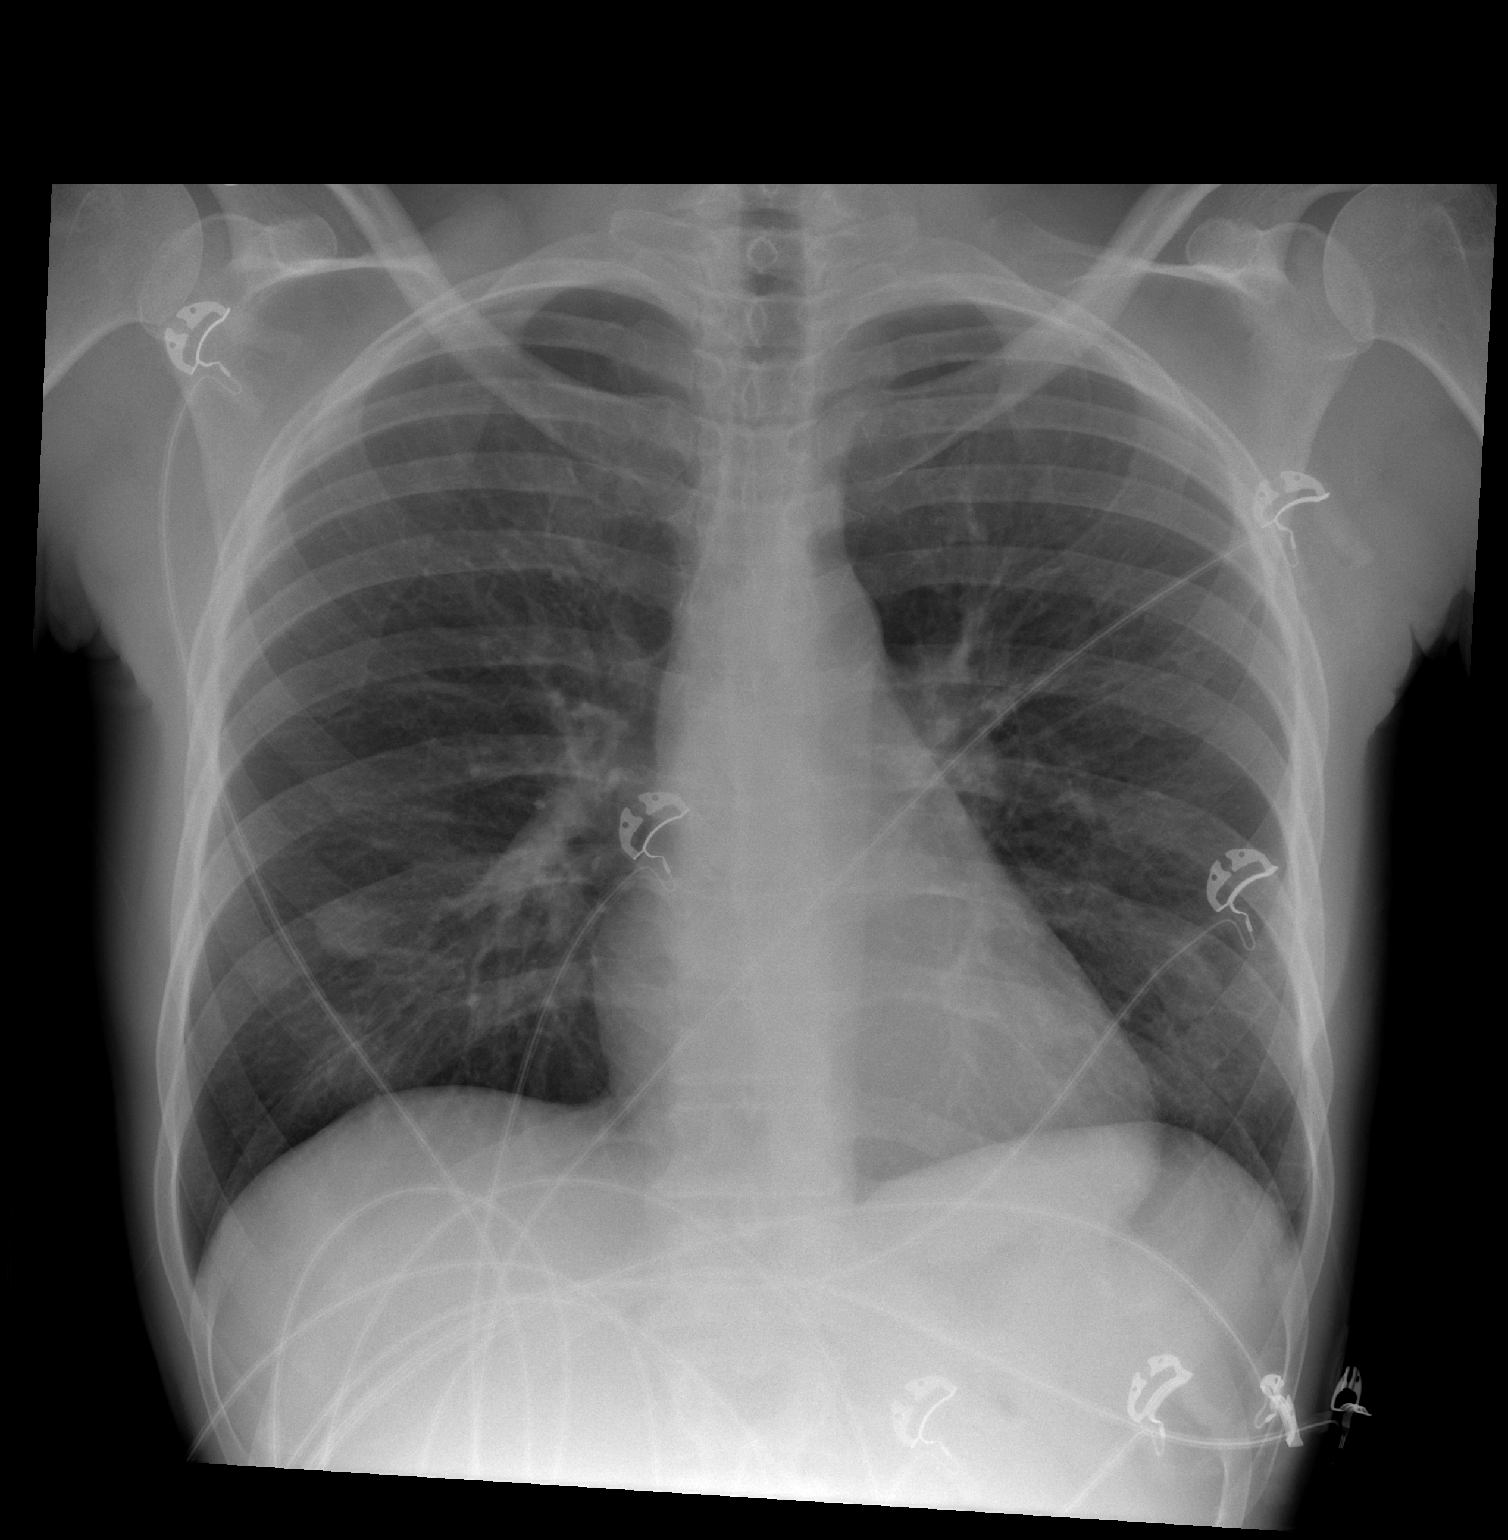

[w chest lat *]
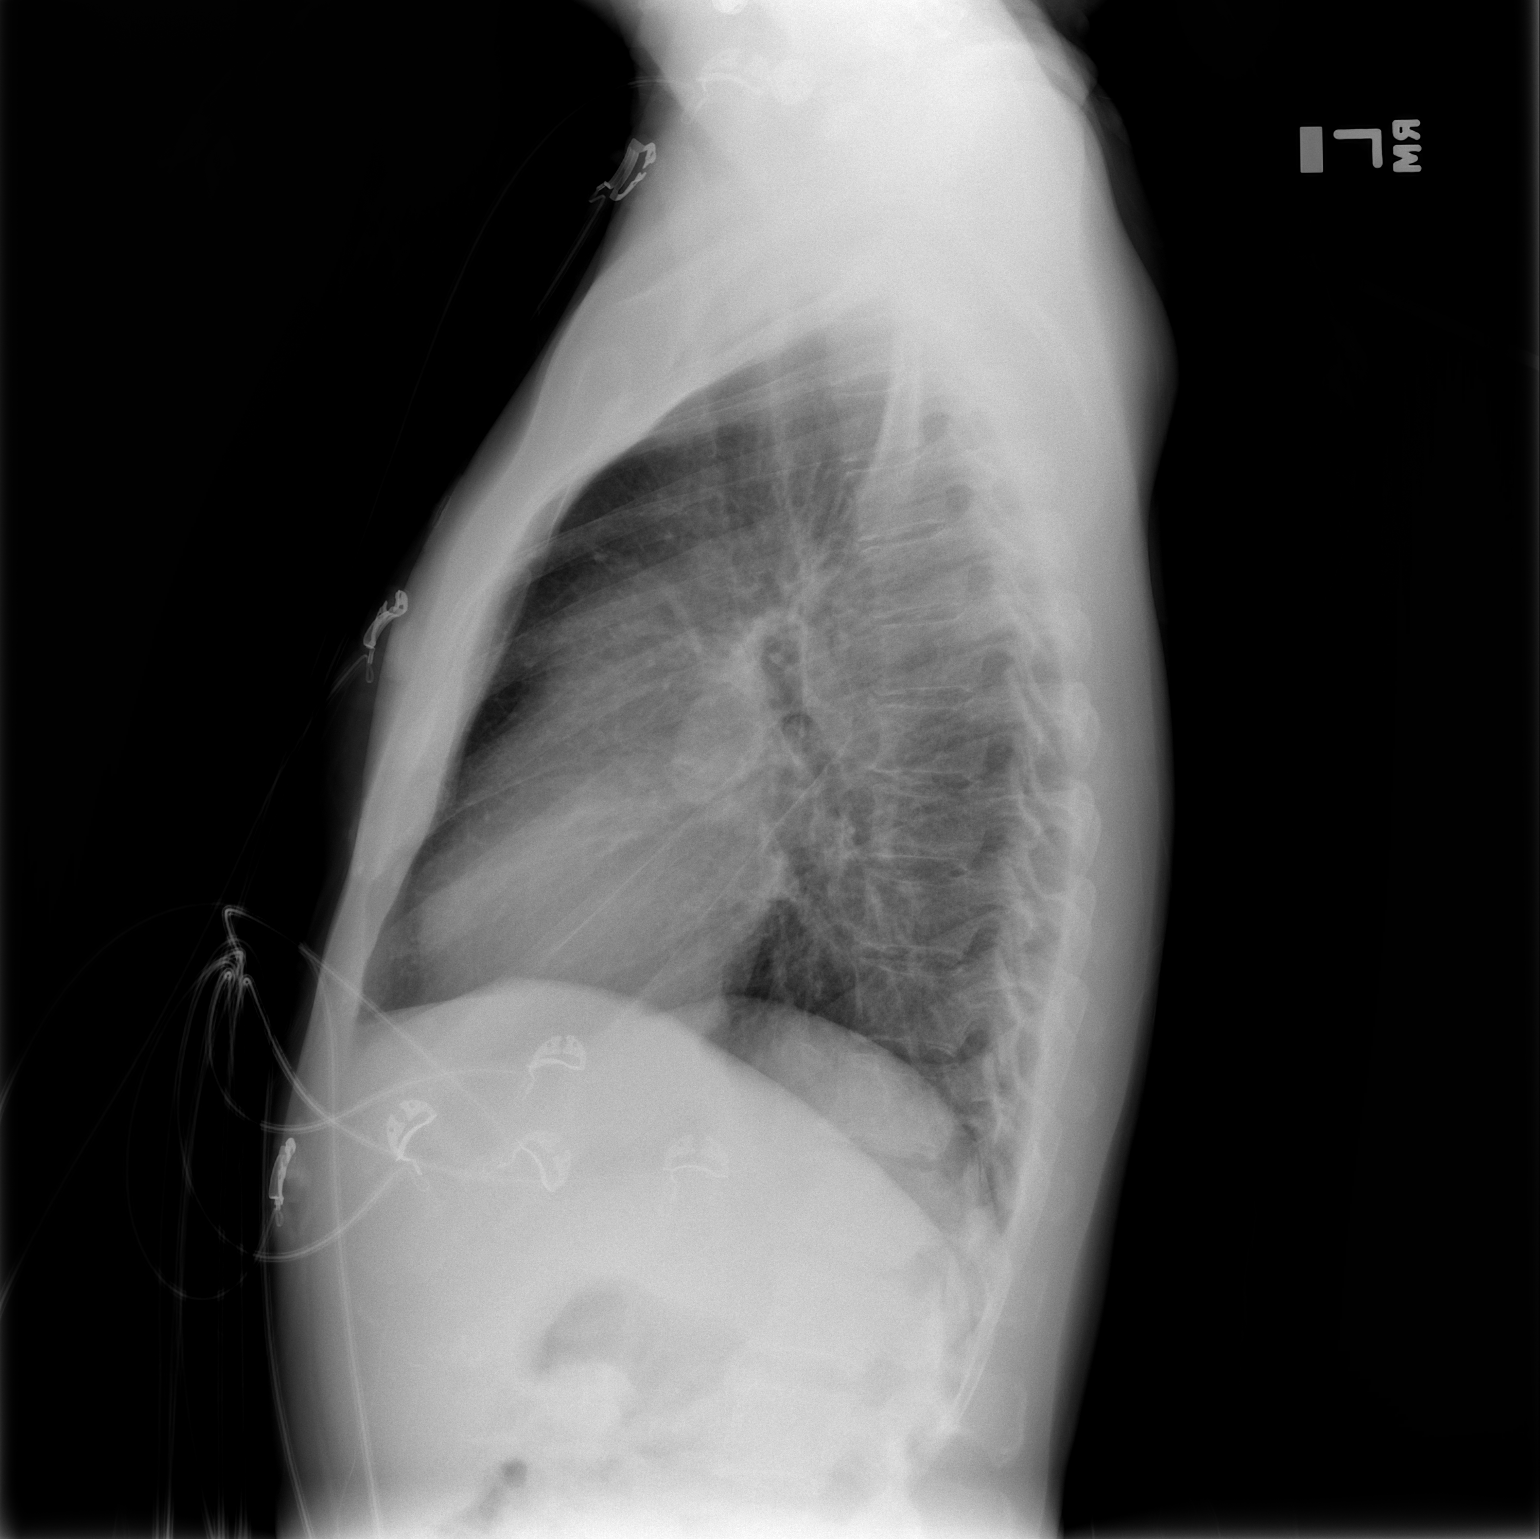

[2 of 2 positions shown; findings below may reference images not displayed]

FINDINGS: Normal cardiac silhouette and mediastinal contours.
Nodular opacities overlying the bilateral mid/lower lung are
unchanged since the [DATE] examination favored to represent nipple
shadows.  No focal parenchymal opacities.  No pleural effusion or
pneumothorax.  Unchanged bones.
IMPRESSION: No acute cardiopulmonary disease.

## 2013-05-13 ENCOUNTER — Encounter (HOSPITAL_COMMUNITY): Payer: Self-pay | Admitting: *Deleted

## 2013-05-13 ENCOUNTER — Emergency Department (HOSPITAL_COMMUNITY): Payer: Medicaid Other

## 2013-05-13 ENCOUNTER — Emergency Department (EMERGENCY_DEPARTMENT_HOSPITAL)
Admission: EM | Admit: 2013-05-13 | Discharge: 2013-05-14 | Disposition: A | Discharge status: Home / Self Care | Payer: Medicaid Other | Source: Home / Self Care | Attending: Emergency Medicine | Admitting: Emergency Medicine

## 2013-05-13 DIAGNOSIS — I776 Arteritis, unspecified: Secondary | ICD-10-CM

## 2013-05-13 DIAGNOSIS — F329 Major depressive disorder, single episode, unspecified: Secondary | ICD-10-CM | POA: Insufficient documentation

## 2013-05-13 DIAGNOSIS — M069 Rheumatoid arthritis, unspecified: Secondary | ICD-10-CM | POA: Insufficient documentation

## 2013-05-13 DIAGNOSIS — I10 Essential (primary) hypertension: Secondary | ICD-10-CM | POA: Diagnosis present

## 2013-05-13 DIAGNOSIS — R109 Unspecified abdominal pain: Secondary | ICD-10-CM | POA: Insufficient documentation

## 2013-05-13 DIAGNOSIS — F29 Unspecified psychosis not due to a substance or known physiological condition: Secondary | ICD-10-CM | POA: Diagnosis present

## 2013-05-13 DIAGNOSIS — F19959 Other psychoactive substance use, unspecified with psychoactive substance-induced psychotic disorder, unspecified: Secondary | ICD-10-CM | POA: Diagnosis present

## 2013-05-13 DIAGNOSIS — R9431 Abnormal electrocardiogram [ECG] [EKG]: Secondary | ICD-10-CM

## 2013-05-13 DIAGNOSIS — F19951 Other psychoactive substance use, unspecified with psychoactive substance-induced psychotic disorder with hallucinations: Secondary | ICD-10-CM

## 2013-05-13 DIAGNOSIS — F17200 Nicotine dependence, unspecified, uncomplicated: Secondary | ICD-10-CM | POA: Insufficient documentation

## 2013-05-13 DIAGNOSIS — F1410 Cocaine abuse, uncomplicated: Secondary | ICD-10-CM

## 2013-05-13 DIAGNOSIS — R45851 Suicidal ideations: Secondary | ICD-10-CM

## 2013-05-13 DIAGNOSIS — M064 Inflammatory polyarthropathy: Secondary | ICD-10-CM | POA: Insufficient documentation

## 2013-05-13 DIAGNOSIS — D649 Anemia, unspecified: Secondary | ICD-10-CM | POA: Insufficient documentation

## 2013-05-13 DIAGNOSIS — H9209 Otalgia, unspecified ear: Secondary | ICD-10-CM | POA: Insufficient documentation

## 2013-05-13 DIAGNOSIS — F1420 Cocaine dependence, uncomplicated: Principal | ICD-10-CM | POA: Diagnosis present

## 2013-05-13 DIAGNOSIS — F1994 Other psychoactive substance use, unspecified with psychoactive substance-induced mood disorder: Secondary | ICD-10-CM

## 2013-05-13 DIAGNOSIS — F24 Shared psychotic disorder: Secondary | ICD-10-CM

## 2013-05-13 DIAGNOSIS — F3289 Other specified depressive episodes: Secondary | ICD-10-CM | POA: Insufficient documentation

## 2013-05-13 LAB — POCT I-STAT TROPONIN I: Troponin i, poc: 0.01 ng/mL (ref 0.00–0.08)

## 2013-05-13 LAB — POCT I-STAT, CHEM 8
BUN: 13 mg/dL (ref 6–23)
Calcium, Ion: 1.2 mmol/L (ref 1.12–1.23)
Creatinine, Ser: 0.8 mg/dL (ref 0.50–1.10)
Glucose, Bld: 106 mg/dL — ABNORMAL HIGH (ref 70–99)
TCO2: 22 mmol/L (ref 0–100)

## 2013-05-13 LAB — RAPID URINE DRUG SCREEN, HOSP PERFORMED
Amphetamines: NOT DETECTED
Barbiturates: NOT DETECTED
Opiates: NOT DETECTED
Tetrahydrocannabinol: NOT DETECTED

## 2013-05-13 LAB — CBC
Hemoglobin: 11.4 g/dL — ABNORMAL LOW (ref 12.0–15.0)
MCH: 29.8 pg (ref 26.0–34.0)
MCV: 83.8 fL (ref 78.0–100.0)
RBC: 3.83 MIL/uL — ABNORMAL LOW (ref 3.87–5.11)

## 2013-05-13 MED ORDER — NICOTINE 21 MG/24HR TD PT24
21.0000 mg | MEDICATED_PATCH | Freq: Every day | TRANSDERMAL | Status: DC
  Administered 2013-05-13 – 2013-05-14 (×2): 21 mg via TRANSDERMAL
  Filled 2013-05-13 (×2): qty 1

## 2013-05-13 MED ORDER — NITROGLYCERIN 0.4 MG SL SUBL: 0.4000 mg | SUBLINGUAL_TABLET | SUBLINGUAL | Status: DC | PRN

## 2013-05-13 MED ORDER — ASPIRIN 81 MG PO CHEW
324.0000 mg | CHEWABLE_TABLET | Freq: Once | ORAL | Status: AC
  Administered 2013-05-13: 324 mg via ORAL
  Filled 2013-05-13: qty 4

## 2013-05-13 MED ORDER — IBUPROFEN 400 MG PO TABS
400.0000 mg | ORAL_TABLET | Freq: Once | ORAL | Status: AC
  Administered 2013-05-13: 400 mg via ORAL
  Filled 2013-05-13: qty 1

## 2013-05-13 MED ORDER — IBUPROFEN 200 MG PO TABS
600.0000 mg | ORAL_TABLET | Freq: Four times a day (QID) | ORAL | Status: DC | PRN
  Administered 2013-05-13 – 2013-05-14 (×2): 600 mg via ORAL
  Filled 2013-05-13 (×2): qty 3

## 2013-05-13 MED ORDER — ONDANSETRON HCL 4 MG PO TABS: 4.0000 mg | ORAL_TABLET | Freq: Three times a day (TID) | ORAL | Status: DC | PRN

## 2013-05-13 MED ORDER — IBUPROFEN 200 MG PO TABS
600.0000 mg | ORAL_TABLET | Freq: Three times a day (TID) | ORAL | Status: DC | PRN
  Administered 2013-05-13: 600 mg via ORAL
  Filled 2013-05-13: qty 1

## 2013-05-13 MED ORDER — ALUM & MAG HYDROXIDE-SIMETH 200-200-20 MG/5ML PO SUSP: 30.0000 mL | ORAL | Status: DC | PRN

## 2013-05-13 MED ORDER — LORAZEPAM 1 MG PO TABS: 1.0000 mg | ORAL_TABLET | Freq: Three times a day (TID) | ORAL | Status: DC | PRN

## 2013-05-13 MED ORDER — ZOLPIDEM TARTRATE 5 MG PO TABS: 5.0000 mg | ORAL_TABLET | Freq: Every evening | ORAL | Status: DC | PRN

## 2013-05-13 NOTE — ED Notes (Signed)
Report given to [name], RN at behavioral health.  Pt. Will be transferred to [facility] after she eats lunch.

## 2013-05-13 NOTE — ED Notes (Signed)
To the bathroom

## 2013-05-13 NOTE — Consult Note (Signed)
Patient seen as a Tele psychiatric assessment. Patient tearful, complaining of ear pain, feels she needs medications for pain. Patient seems overwhelmed and cannot be assessed. Will transfer patient to Psy. ED for a face to face assessment

## 2013-05-13 NOTE — ED Notes (Signed)
Pt has stopped sobbing and is eating cheetos.

## 2013-05-13 NOTE — ED Notes (Signed)
Telepsych at the bedside.  Pt. Speaking with Telepsych.

## 2013-05-13 NOTE — ED Provider Notes (Signed)
CSN: [id number]     Arrival date & time 05/13/13  3762 History     First MD Initiated Contact with Patient 05/13/13 [phone]     Chief Complaint  Patient presents with  . Chest Pain   (Consider location/radiation/quality/duration/timing/severity/associated sxs/prior Treatment) HPI  [name] is a 52 y.o. 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 Level V caveat secondary to likely intoxication and extreme agitation. Patient is endorsing a vague suicidal ideation.   Past Medical History  Diagnosis Date  . 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 Followed by Dr. Louanne Skye  . Hypertension   . Cocaine abuse     ongoing with resultant vaculitis.  . Tobacco abuse   . Depression   . Normocytic anemia     BL Hgb 9.8-12. Last anemia panel 04/2010 - showing Fe 19, ferritin 101.  Pt on monthly B12 injections  . Rheumatoid arthritis(714.0)     patient reported  . Inflammatory arthritis    Past Surgical History  Procedure Laterality Date  . Skin biopsy      bilateral shin nodules on 04/2010  . Hernia repair     Family History  Problem Relation Age of Onset  . Breast cancer Mother     Breast cancer  . Alcohol abuse Mother   . Colon cancer Maternal Aunt 76  . Alcohol abuse Father    History  Substance Use Topics  . Smoking status: Current Every Day Smoker -- 0.10 packs/day for 39 years    Types: Cigarettes  . Smokeless tobacco: Never Used     Comment: trying  . Alcohol Use: No   OB History   Grav Para Term Preterm Abortions TAB SAB Ect Mult Living                 Review of Systems 10 systems reviewed and found to be negative, except as noted in the HPI   Allergies  Acetaminophen  Home Medications  No current outpatient prescriptions on file. BP 192/92  Pulse 78  Temp(Src) 98 F (36.7 C) (Oral)  Resp  14  SpO2 100% Physical Exam  Nursing note and vitals reviewed. Constitutional: She is oriented to person, place, and time. She appears well-developed and well-nourished.  Agitated, screaming and crying  HENT:  Head: Normocephalic.  Loss of cartilage to bilateral ears with black necrotic lesions on the pinna  Adentulous  Eyes: Conjunctivae and EOM are normal.  Cardiovascular: Normal rate.   Pulmonary/Chest: Effort normal. No stridor.  Musculoskeletal: Normal range of motion.  Neurological: She is alert and oriented to person, place, and time.  Psychiatric: She has a normal mood and affect.    ED Course   Procedures (including critical care time)  Labs Reviewed  CBC - Abnormal; Notable for the following:    WBC 3.2 (*)    RBC 3.83 (*)    Hemoglobin 11.4 (*)    HCT 32.1 (*)    All other components within normal limits  URINE RAPID DRUG SCREEN (HOSP PERFORMED) - Abnormal; Notable for the following:    Cocaine POSITIVE (*)    All other components within normal limits  POCT I-STAT, CHEM 8 - Abnormal; Notable for the following:    Potassium 3.4 (*)    Glucose, Bld 106 (*)    Hemoglobin 10.9 (*)    HCT 32.0 (*)  All other components within normal limits  ETHANOL  POCT I-STAT TROPONIN I  POCT I-STAT TROPONIN I   No results found.   Date: 05/13/2013  Rate: 75  Rhythm: normal sinus rhythm  QRS Axis: left  Intervals: QT prolonged  ST/T Wave abnormalities: nonspecific ST changes  Conduction Disutrbances:none  Narrative Interpretation: LVH  Old EKG Reviewed: New QT prolongation   1. Suicidal ideation   2. VASCULITIS   3. Cocaine abuse     MDM   Filed Vitals:   05/13/13 0530 05/13/13 0537 05/13/13 0545  BP: 200/104 188/136 192/92  Pulse: 88  78  Temp:  98 F (36.7 C)   TempSrc:  Oral   Resp:  20 14  SpO2: 100% 100% 100%     [name] is a 52 y.o. female who is homeless, history of cocaine abuse and cocaine-related vasculitis complaining of bilateral ear  pain and chest pain. EKG is nonischemic, she does have a prolonged QT today. 2 troponins are negative. She's also endorsing a passive suicidal ideation. Act team and social work consult pending.  [place] recommends transferring patients to behavioral ward at [facility] because there is no rooms at behavioral health. Will draw a delta troponin before transfer.  She is medically cleared for psychiatric evaluation. Psych old in orders placed.  Dr. Dwyane Dee has tele-evaluated the pt and feels he needs a face-to-face evaluation. Patient will be transferred to [facility] long.   This is a shared visit with the attending physician who personally evaluated the patient and agrees with the care plan.   Note: Portions of this report may have been transcribed using voice recognition software. Every effort was made to ensure accuracy; however, inadvertent computerized transcription errors may be present    Monico Blitz, PA-C 05/13/13 1511

## 2013-05-13 NOTE — ED Notes (Signed)
Pt. oob to the bathroom,  Gait steady, Megan, RN brought the Telepsych to pt.

## 2013-05-13 NOTE — ED Notes (Signed)
Continuing to cry hysterically. Reports bilateral "ears burning like they are on fire".

## 2013-05-13 NOTE — ED Notes (Signed)
Pt to have another telepsych before being transferred to [facility]. Pt to be moved to P[facility]. Report given to Cecille Rubin, Therapist, sports.

## 2013-05-13 NOTE — ED Notes (Signed)
Pt aaox3.  Pt c/o pain to bilateral ears.  Pt has scabs on ears.  Pt reports "from whatever they use to cut the cocaine with is causing skin problems."  Pt denies SI/HI/AH/VH.  Pt reports "I just need to be clean, I am tired.  I been doing this since i was 52 yrs old."

## 2013-05-13 NOTE — ED Notes (Addendum)
Pt here by EMS, ambulatory in to ED, homeless, [place], pt tearful and crying, c/o CWP, relates to arthritis, worse with movement, also outer ear pain, "ear pain is the worst of sx", lesions noted, scant drainage with scabs and dried blood to bilateral ears. CP onset 30 minutes ago. Mentions congestion and having been caught in the rain and getting wet. Has lived at [facility], but got mad and left, she verbalizes making a mistake and said that they would be giving her another chance next month. Mentions subjective vague fever. Denies: nv, sob, ate a little bit today, alert, NAD, calm, interactive. Mentions distant "possibility that rats may have chewed on her ears, but she is not sure". Pt now sobbing. States, "because she needs help, mostly for cocaine addiction, last use yesterday". Has had SI sometimes. Denies SI at this time.

## 2013-05-13 NOTE — ED Notes (Signed)
Pt undergoing telepsych evaluation at the time.

## 2013-05-13 NOTE — ED Notes (Signed)
Pt resting quietly at the time. Vital signs stable. Eating sandwich. Pt updated on plan of care. No signs of distress noted. She is alert and oriented x4. NAD.

## 2013-05-13 NOTE — ED Notes (Signed)
Reports ears are starting to hurt again.  Dr Zenia Resides contacted change ibuprofen to 648m every 6 hours as needed TORB

## 2013-05-13 NOTE — ED Notes (Signed)
YGE:[id number]<TC> Expected date:05/13/13<BR> Expected time:<BR> Means of arrival:<BR> Comments:<BR> [facility]

## 2013-05-13 NOTE — ED Provider Notes (Signed)
Medical screening examination/treatment/procedure(s) were conducted as a shared visit with non-physician practitioner(s) and myself.  I personally evaluated the patient during the encounter and agree with the exam and plan.  Pt is a 52yo AAF with h/o substance abuse and cocaine induced necrosis of both ears who presents to ED with c/o sharp CP without radiation, SOB, N/V, diaphoresis, pain in her ears, swelling to her feet.  Patient denies any prior cardiac history. No history of pulmonary embolus or DVT. On exam, patient is tearful and has poor hygiene. She has necrosis to bilateral pinna with no obvious sign of infection currently. Heart sounds are normal. Lungs are clear. No peripheral edema or JVD.  Patient does have me risk factors for cardiac disease however symptoms today are likely related to contain abuse. Her EKG shows no ischemic changes. Will performed 2 sets of cardiac enzymes. If negative, anticipate discharge home.  9:26 AM  Pt now endorsing SI.  Behavioral health to accept.  First set troponin negative.  Second set CE pending.  12:00 PM  transferred to [facility] long periods and for cardiac enzymes negative. Hemodynamically stable and medically clear. Will need psychiatry disposition.  [place], DO 05/13/13 1651

## 2013-05-13 NOTE — BH Assessment (Addendum)
Tele Assessment Note   [name] is a 52 y.o. single black female.  She presents at [facility] complaining of pain in her ears and her chest.  While there she reports problems with cocaine, prompting EDP to request Tele Assessment.  Stressors: Pt is currently homeless.  At times she sleeps "under the bridge."  She believes that while there rats chewed on her ears, precipitating the ear pain problems.  When asked about social supports pt replies, "I ain't got nobody."  She was recently at [facility], but after a friend started having problems with seizures, she left to stay with him in order to help him.  She would like to return to [facility], but first feels that she needs help with crack cocaine problems.  She has tried outpatient treatment in the past, but has failed at it, and so, would like to be admitted to a residential program.  Lethality:  Suicidality:Pt is vague in responding to questions about SI.  She reports mostly passive suicidality, feeling that she would be better off if she were not alive.  However, she states that "at times" she feels like overdosing on cocaine.  She cannot specify when the last time was that she felt that way.  She has never made a suicide attempt, and she identified her family as a deterrent against suicide attempts.  However, her cocaine use has alienated them, and she has had no contact with family in the past 17 years. Homicidality: Pt denies any homicidal thoughts or violent behavior.  She reports that she becomes argumentative when people make her angry, and she wants to attack them, but has never acted on these thoughts.  She does not have access to firearms.  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  She now has a license and believes charges will be dismissed.  She is currently intermittently tearful, but cooperative. Psychosis:  Reports AH without command of voices  calling her name "at times."  The most recent episode was 5 - 6 days ago.  She exhibits no delusional thought, and is not responding to internal stimuli.  Her reality testing appears to be intact. Substance abuse:  Pt reports daily use of crack cocaine, typically between $25 and $30 a day.  She is vague about duration, but has used consistently for the past 4 years.  She started using around 80 or 52 years of age.  Her longest period of sobriety was for 7 years in the 1990's during her incarceration.  Pt denies using any other substances.  Treatment history:  Pt has never been hospitalized for mental health or substance abuse treatment, although last year she was sent to [place], which sent her back to the ED after finding that her medical problems were too acute for them to handle.  Pt was in the [facility] program for substance abusers while incarcerated in the 1990's, and at that time also participated in 12-Step meetings.  "A while ago" she went to [place] for a few visits, but was disappointed in the service and stopped going.  She is not on any medications, psychotropic or otherwise at this time.  She has reportedly been prescribed Prozac, Trazodone, and Zoloft in the past.   Axis I: Mood Disorder NOS 296.90; Cocaine Dependence 304.20 Axis II: Deferred 799.9 Axis III:  Past Medical History  Diagnosis Date  . 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 Followed by Dr. Louanne Skye  . Hypertension   . Cocaine abuse     ongoing with resultant vaculitis.  . Tobacco abuse   . Depression   . Normocytic anemia     BL Hgb 9.8-12. Last anemia panel 04/2010 - showing Fe 19, ferritin 101.  Pt on monthly B12 injections  . Rheumatoid arthritis(714.0)     patient reported  . Inflammatory arthritis    Axis IV: economic problems, housing problems, problems related to legal system/crime and problems with access to health care services Axis V: GAF = 40  Past Medical History:  Past Medical  History  Diagnosis Date  . 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 Followed by Dr. Louanne Skye  . Hypertension   . Cocaine abuse     ongoing with resultant vaculitis.  . Tobacco abuse   . Depression   . Normocytic anemia     BL Hgb 9.8-12. Last anemia panel 04/2010 - showing Fe 19, ferritin 101.  Pt on monthly B12 injections  . Rheumatoid arthritis(714.0)     patient reported  . Inflammatory arthritis     Past Surgical History  Procedure Laterality Date  . Skin biopsy      bilateral shin nodules on 04/2010  . Hernia repair      Family History:  Family History  Problem Relation Age of Onset  . Breast cancer Mother     Breast cancer  . Alcohol abuse Mother   . Colon cancer Maternal Aunt 15  . Alcohol abuse Father     Social History:  reports that she has been smoking Cigarettes.  She has a 3.9 pack-year smoking history. She has never used smokeless tobacco. She reports that she uses illicit drugs ("Crack" cocaine and Cocaine). She reports that she does not drink alcohol.  Additional Social History:  Alcohol / Drug Use Pain Medications: Denies Prescriptions: Denies Over the Counter: Denies Longest period of sobriety (when/how long): 7 years while incarcerated in the 1990's Negative Consequences of Use: Personal relationships Substance #1 Name of Substance 1: Cocaine (crack) 1 - Age of First Use: 20 or 52 y/o 1 - Amount (size/oz): $25 - $30 1 - Frequency: daily 1 - Duration: 4 years 1 - Last Use / Amount: 05/12/2013: $20  CIWA: CIWA-Ar BP: 135/88 mmHg Pulse Rate: 87 COWS:    Allergies:  Allergies  Allergen Reactions  . Acetaminophen Swelling    REACTION: eyelid swelling    Home Medications:  (Not in a hospital admission)  OB/GYN Status:  No LMP recorded. Patient is postmenopausal.  General Assessment Data Location of Assessment: [facility] ED Is this a Tele or Face-to-Face Assessment?: Tele Assessment Is this an Initial Assessment or a Re-assessment for this  encounter?: Initial Assessment Living Arrangements: Other (Comment) (Homeless) Can pt return to current living arrangement?: Yes Admission Status: Voluntary Is patient capable of signing voluntary admission?: Yes Transfer from: [facility] Referral Source: MD (Pt located at [facility]; assessment requested by EDP)  Education Status Is patient currently in school?: No  Risk to self Suicidal Ideation: Yes-Currently Present Suicidal Intent: No Is patient at risk for suicide?: Yes Suicidal Plan?: No (Currently passive only, feeling she would be better off dead) Access to Means: No What has been your use of drugs/alcohol within the last 12 months?: Daily crack cocaine use. Previous Attempts/Gestures: No How many times?: 0 Other Self Harm Risks: Reports thoughts of overdosing on crack cocaine "at times," unable to specify the last time.  Identifies family as deterrent, but no contact in 17 years. Triggers for Past Attempts: Other (Comment) (Not applicable) Intentional Self Injurious Behavior: None Family Suicide History: Unknown Recent stressful life event(s): Other (Comment) (Homelessness; Medical problems) Persecutory voices/beliefs?: No Depression: Yes Depression Symptoms: Despondent;Insomnia;Tearfulness;Fatigue;Guilt;Loss of interest in usual pleasures;Feeling worthless/self pity;Feeling angry/irritable (Hopelessness) Substance abuse history and/or treatment for substance abuse?: Yes (Daily crack cocaine use.) Suicide prevention information given to non-admitted patients: Not applicable (Tele-assessment)  Risk to Others Homicidal Ideation: No Thoughts of Harm to Others: No Current Homicidal Intent: No Current Homicidal Plan: No Access to Homicidal Means: No Identified Victim: None History of harm to others?: No Assessment of Violence: None Noted Violent Behavior Description: Tearful during assessment; argumentative and wants to hit people "when they make me mad," but no physical  violence. 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 a license Does patient have a court date: Yes Court Date: 06/08/13  Psychosis Hallucinations: Auditory ("At times" - AH of voices calling name 5 - 6 days ago.) Delusions: None noted  Mental Status Report Appear/Hygiene: Disheveled Eye Contact: Poor Motor Activity: Restlessness Speech: Other (Comment) (Garbled by intermittent tearfulness) Level of Consciousness: Other (Comment) (Lethargic) Mood: Depressed;Other (Comment) (Tearful) Affect: Other (Comment) (Constricted) Anxiety Level: None Thought Processes: Coherent;Relevant (Vague about timeline; frequently states: "at times") Judgement: Unimpaired Orientation: Person;Place;Time;Situation (Time: partial; not to date or day of week) Obsessive Compulsive Thoughts/Behaviors: None  Cognitive Functioning Concentration: Decreased Memory: Recent Intact;Remote Intact IQ: Average Insight: Fair Impulse Control: Fair Appetite: Poor Weight Loss: 13 (10 - 15# over past 2 mos. Ht: 5' 1.5"; Wt: 98 - 99#) Weight Gain: 0 Sleep: Decreased Total Hours of Sleep: 4 (3 - 4 hrs/night x 2 months) Vegetative Symptoms: Not bathing;Decreased grooming ("At times")  ADLScreening [facility] Assessment Services) Patient's cognitive ability adequate to safely complete daily activities?: Yes Patient able to express need for assistance with ADLs?: Yes Independently performs ADLs?: Yes (appropriate for developmental age)  Prior Inpatient Therapy Prior Inpatient Therapy: No Prior Therapy Dates: Sent to [place] about 1 yr ago, but returned due to medical problems  Prior Outpatient Therapy Prior Outpatient Therapy: Yes Prior Therapy Dates: While incarcerated in 1990's pt was in [facility]ART program Prior Therapy Facilty/Provider(s): "A while ago" went to [facility] for a couple visits Reason for Treatment:  1990's: Limited 12-Step meetings while in DART  ADL Screening (condition at time of admission) Patient's cognitive ability adequate to safely complete daily activities?: Yes Is the patient deaf or have difficulty hearing?: No Does the patient have difficulty seeing, even when wearing glasses/contacts?: No Does the patient have difficulty concentrating, remembering, or making decisions?: Yes Patient able to express need for assistance with ADLs?: Yes Does the patient have difficulty dressing or bathing?: No Independently performs ADLs?: Yes (appropriate for developmental age) Does the patient have difficulty walking or climbing stairs?: Yes (Only when legs are swollen.) Weakness of Legs: None Weakness of Arms/Hands: None  Home Assistive Devices/Equipment Home Assistive Devices/Equipment: None    Abuse/Neglect Assessment (Assessment to be complete while patient is alone) Physical Abuse: Denies Verbal Abuse: Denies Sexual Abuse: Denies Exploitation of patient/patient's resources: Denies Self-Neglect: Denies, provider concerned (Comment) (Homeless; acknowledges neglecting self care) Values / Beliefs Cultural Requests During Hospitalization: None Spiritual Requests During Hospitalization: None   Advance Directives (For Healthcare) Advance Directive: Patient does not have advance directive Pre-existing out of facility DNR order (yellow form or pink MOST form): No Nutrition Screen- [facility]  Adult/WL/AP Patient's home diet: Regular  Additional Information 1:1 In Past 12 Months?: No CIRT Risk: No Elopement Risk: No Does patient have medical clearance?: Yes     Disposition: Reviewed with Hampton Abbot, MD @ 08:45.  She determined that pt is to be transferred to [facility] for a face-to-face psychiatry consult to determine final disposition.  Disposition Initial Assessment Completed for this Encounter: Yes Disposition of Patient: Other dispositions (To be determined by psychiatrist after  face-to-face consult) Other disposition(s): Other (Comment) (To be determined by psychiatrist after face-to-face consult)  Jalene Mullet, MA Assessment Counselor, Triage Specialist [facility] [place] 05/13/2013 10:06 AM

## 2013-05-13 NOTE — ED Notes (Signed)
Informed Security,  Pt. To be transferred to [facility].

## 2013-05-13 NOTE — Progress Notes (Signed)
Nurse informed CSW that patient would be transferred to [place] ED. Updated weekend CSW at [facility]. Please call if further needs arise before transfer.  Tilden Fossa, [facility]. [phone]

## 2013-05-13 NOTE — ED Notes (Signed)
Telepsych evaluation complete. Pt resting quietly at the time. Vital signs stable.

## 2013-05-14 ENCOUNTER — Encounter (HOSPITAL_COMMUNITY): Payer: Self-pay | Admitting: *Deleted

## 2013-05-14 ENCOUNTER — Inpatient Hospital Stay (HOSPITAL_COMMUNITY)
Admission: AD | Admit: 2013-05-14 | Discharge: 2013-05-17 | DRG: 897 | Disposition: A | Discharge status: Home / Self Care | Payer: Medicaid Other | Source: Ambulatory Visit | Attending: Psychiatry | Admitting: Psychiatry

## 2013-05-14 ENCOUNTER — Encounter (HOSPITAL_COMMUNITY): Payer: Self-pay | Admitting: Registered Nurse

## 2013-05-14 DIAGNOSIS — F1420 Cocaine dependence, uncomplicated: Secondary | ICD-10-CM | POA: Diagnosis present

## 2013-05-14 DIAGNOSIS — F39 Unspecified mood [affective] disorder: Secondary | ICD-10-CM

## 2013-05-14 DIAGNOSIS — F19959 Other psychoactive substance use, unspecified with psychoactive substance-induced psychotic disorder, unspecified: Secondary | ICD-10-CM | POA: Diagnosis present

## 2013-05-14 DIAGNOSIS — F329 Major depressive disorder, single episode, unspecified: Secondary | ICD-10-CM

## 2013-05-14 DIAGNOSIS — F29 Unspecified psychosis not due to a substance or known physiological condition: Secondary | ICD-10-CM | POA: Diagnosis present

## 2013-05-14 DIAGNOSIS — F1910 Other psychoactive substance abuse, uncomplicated: Secondary | ICD-10-CM

## 2013-05-14 MED ORDER — NICOTINE 21 MG/24HR TD PT24
21.0000 mg | MEDICATED_PATCH | Freq: Every day | TRANSDERMAL | Status: DC
  Administered 2013-05-15 – 2013-05-17 (×3): 21 mg via TRANSDERMAL
  Filled 2013-05-14 (×5): qty 1

## 2013-05-14 MED ORDER — ALUM & MAG HYDROXIDE-SIMETH 200-200-20 MG/5ML PO SUSP: 30.0000 mL | ORAL | Status: DC | PRN

## 2013-05-14 MED ORDER — ACETAMINOPHEN 325 MG PO TABS: 650.0000 mg | ORAL_TABLET | Freq: Four times a day (QID) | ORAL | Status: DC | PRN

## 2013-05-14 MED ORDER — MAGNESIUM HYDROXIDE 400 MG/5ML PO SUSP: 30.0000 mL | Freq: Every day | ORAL | Status: DC | PRN

## 2013-05-14 MED ORDER — NITROGLYCERIN 0.4 MG SL SUBL: 0.4000 mg | SUBLINGUAL_TABLET | SUBLINGUAL | Status: DC | PRN

## 2013-05-14 MED ORDER — IBUPROFEN 600 MG PO TABS
600.0000 mg | ORAL_TABLET | Freq: Four times a day (QID) | ORAL | Status: DC | PRN
  Administered 2013-05-14 – 2013-05-17 (×6): 600 mg via ORAL
  Filled 2013-05-14 (×6): qty 1

## 2013-05-14 MED ORDER — LORAZEPAM 1 MG PO TABS
1.0000 mg | ORAL_TABLET | Freq: Three times a day (TID) | ORAL | Status: DC | PRN
  Administered 2013-05-14 – 2013-05-16 (×4): 1 mg via ORAL
  Filled 2013-05-14 (×4): qty 1

## 2013-05-14 MED ORDER — BACITRACIN 500 UNIT/GM EX OINT
1.0000 "application " | TOPICAL_OINTMENT | Freq: Two times a day (BID) | CUTANEOUS | Status: DC
  Administered 2013-05-14: 1 via TOPICAL
  Filled 2013-05-14 (×3): qty 0.9

## 2013-05-14 NOTE — Consult Note (Signed)
Reason for Consult: Evaluation for inpatient treatment Referring Physician: EDP  [name] is an 52 y.o. female.  HPI: Present to hospital with complaints of ear pain; command auditory hallucinations telling her to walk into traffic that only occurs when she is high.  At this time patient denies suicidal ideations, homicidal hallucinations.  Patient states that she has paranoia daily that someone is watching/talking abut her, and trying to steal her money.  States that paranoia is worse when she is high.  Patient also  states when she is high voices has told her to hurt other.  Would not elaborate on what she ment by hurt others.  Patient states that she was living in an assisted living facility and is able to go back June 05, 2013. Patient states that she has already spoken to someone in home about returning but has to wait until she gets her check at the beginning of the month [facility]).  Patient has no history of psychiatric illness or treatment.  Patient states that she is not on any medication.   Past Medical History  Diagnosis Date  . 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 Followed by Dr. Louanne Skye  . Hypertension   . Cocaine abuse     ongoing with resultant vaculitis.  . Tobacco abuse   . Depression   . Normocytic anemia     BL Hgb 9.8-12. Last anemia panel 04/2010 - showing Fe 19, ferritin 101.  Pt on monthly B12 injections  . Rheumatoid arthritis(714.0)     patient reported  . Inflammatory arthritis     Past Surgical History  Procedure Laterality Date  . Skin biopsy      bilateral shin nodules on 04/2010  . Hernia repair      Family History  Problem Relation Age of Onset  . Breast cancer Mother     Breast cancer  . Alcohol abuse Mother   . Colon cancer Maternal Aunt 61  . Alcohol abuse Father     Social History:  reports that she has been smoking Cigarettes.  She has a 3.9 pack-year smoking history. She has never used smokeless tobacco. She reports that she uses  illicit drugs ("Crack" cocaine and Cocaine). She reports that she does not drink alcohol.  Allergies:  Allergies  Allergen Reactions  . Acetaminophen Swelling    REACTION: eyelid swelling    Medications: I have reviewed the patient's current medications.  Results for orders placed during the hospital encounter of 05/13/13 (from the past 48 hour(s))  CBC     Status: Abnormal   Collection Time    05/13/13  5:39 AM      Result Value Range   WBC 3.2 (*) 4.0 - 10.5 K/uL   RBC 3.83 (*) 3.87 - 5.11 MIL/uL   Hemoglobin 11.4 (*) 12.0 - 15.0 g/dL   HCT 32.1 (*) 36.0 - 46.0 %   MCV 83.8  78.0 - 100.0 fL   MCH 29.8  26.0 - 34.0 pg   MCHC 35.5  30.0 - 36.0 g/dL   RDW 13.9  11.5 - 15.5 %   Platelets 271  150 - 400 K/uL  POCT I-STAT TROPONIN I     Status: None   Collection Time    05/13/13  5:46 AM      Result Value Range   Troponin i, poc 0.01  0.00 - 0.08 ng/mL   Comment 3            Comment: Due  to the release kinetics of cTnI,     a negative result within the first hours     of the onset of symptoms does not rule out     myocardial infarction with certainty.     If myocardial infarction is still suspected,     repeat the test at appropriate intervals.  POCT I-STAT, CHEM 8     Status: Abnormal   Collection Time    05/13/13  5:48 AM      Result Value Range   Sodium 140  135 - 145 mEq/L   Potassium 3.4 (*) 3.5 - 5.1 mEq/L   Chloride 106  96 - 112 mEq/L   BUN 13  6 - 23 mg/dL   Creatinine, Ser 0.80  0.50 - 1.10 mg/dL   Glucose, Bld 106 (*) 70 - 99 mg/dL   Calcium, Ion 1.20  1.12 - 1.23 mmol/L   TCO2 22  0 - 100 mmol/L   Hemoglobin 10.9 (*) 12.0 - 15.0 g/dL   HCT 32.0 (*) 36.0 - 46.0 %  ETHANOL     Status: None   Collection Time    05/13/13  7:11 AM      Result Value Range   Alcohol, Ethyl (B) <11  0 - 11 mg/dL   Comment:            LOWEST DETECTABLE LIMIT FOR     SERUM ALCOHOL IS 11 mg/dL     FOR MEDICAL PURPOSES ONLY  URINE RAPID DRUG SCREEN (HOSP PERFORMED)     Status:  Abnormal   Collection Time    05/13/13  7:31 AM      Result Value Range   Opiates NONE DETECTED  NONE DETECTED   Cocaine POSITIVE (*) NONE DETECTED   Benzodiazepines NONE DETECTED  NONE DETECTED   Amphetamines NONE DETECTED  NONE DETECTED   Tetrahydrocannabinol NONE DETECTED  NONE DETECTED   Barbiturates NONE DETECTED  NONE DETECTED   Comment:            DRUG SCREEN FOR MEDICAL PURPOSES     ONLY.  IF CONFIRMATION IS NEEDED     FOR ANY PURPOSE, NOTIFY LAB     WITHIN 5 DAYS.                LOWEST DETECTABLE LIMITS     FOR URINE DRUG SCREEN     Drug Class       Cutoff (ng/mL)     Amphetamine      1000     Barbiturate      200     Benzodiazepine   010     Tricyclics       071     Opiates          300     Cocaine          300     THC              50  POCT I-STAT TROPONIN I     Status: None   Collection Time    05/13/13 10:13 AM      Result Value Range   Troponin i, poc 0.02  0.00 - 0.08 ng/mL   Comment 3            Comment: Due to the release kinetics of cTnI,     a negative result within the first hours     of the onset of symptoms does not rule out  myocardial infarction with certainty.     If myocardial infarction is still suspected,     repeat the test at appropriate intervals.    Dg Chest Portable 1 View  05/13/2013   *RADIOLOGY REPORT*  Clinical Data: Chest pain.  PORTABLE CHEST - 1 VIEW  Comparison: Chest radiograph performed 02/18/2013  Findings: The lungs are well-aerated and clear.  There is no evidence of focal opacification, pleural effusion or pneumothorax. Bilateral nipple shadows are noted.  The cardiomediastinal silhouette is normal in size; calcification is noted in the aortic arch.  No acute osseous abnormalities are seen.  IMPRESSION: No acute cardiopulmonary process seen.   Original Report Authenticated By: Santa Lighter, M.D.    Review of Systems  HENT: Positive for ear pain. Negative for hearing loss, congestion and sore throat.        Loss of  cartilage to bilateral ears with black necrotic lesions on the pinna  Respiratory: Negative for cough and shortness of breath.   Skin:       multiple healed areas noted, small approx 1x.5 cm area noted inner upper rt buttock  Neurological: Negative for headaches.  Psychiatric/Behavioral: Positive for suicidal ideas (Passive.  Patient states that she doesn't want to kill herself.  States that voices only tells her things when she is hight.  "They might say something like walk in front of a car".), hallucinations and memory loss. Substance abuse: Cocaine and THC.  Patient states that the longest period of  time without smoking crack cocaine is 5-6 day. The patient is not nervous/anxious and does not have insomnia.        Patient states that she has never attempted suicide before and has never acted on the command voices when she is high.  Patient states that when she is not high she sometimes hears someone calling her name.  Patient also states that she is constantly paranoid that someone is out to get her, talking about her, and trying to steal her money.  States that the paranoia is worse when she is high on cocaine.     Blood pressure 168/89, pulse 60, temperature 98.4 F (36.9 C), temperature source Oral, resp. rate 18, height 5' 1"  (1.549 m), weight 44.906 kg (99 lb), SpO2 100.00%. Physical Exam  Constitutional: She is oriented to person, place, and time.  Neck: Normal range of motion.  Musculoskeletal: Normal range of motion.  Neurological: She is alert and oriented to person, place, and time.  Skin: Skin is warm and dry.  Loss of cartilage to bilateral ears with black necrotic lesions on the pinna  multiple healed areas noted, small approx 1x.5 cm area noted inner upper rt buttock    Psychiatric: Her mood appears anxious. Thought content is paranoid. She expresses no homicidal and no suicidal (Passive.  Command voices when using crack cocaine) ideation.  Abnormal memory and judgement      Assessment/Plan: Axis I: Mood Disorder NOS and Polysubstance Abuse Axis II: Deferred Axis III:  Past Medical History  Diagnosis Date  . 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 Followed by Dr. Louanne Skye  . Hypertension   . Cocaine abuse     ongoing with resultant vaculitis.  . Tobacco abuse   . Depression   . Normocytic anemia     BL Hgb 9.8-12. Last anemia panel 04/2010 - showing Fe 19, ferritin 101.  Pt on monthly B12 injections  . Rheumatoid arthritis(714.0)     patient reported  . Inflammatory arthritis  Axis IV: economic problems, educational problems, housing problems, occupational problems, other psychosocial or environmental problems and problems with primary support group Axis V: 21-30 behavior considerably influenced by delusions or hallucinations OR serious impairment in judgment, communication OR inability to function in almost all areas  Recommendations: Inpatient treatment.  Patient accepted to [facility] [address]. Admit for crisis management and stabilization.  2. Review and initiate  medications pertinent to patient illness and treatment.  3. Medication management to reduce current symptoms to base line and improve the         patient's overall level of functioning.   Rankin, Shuvon, FNP-BC 05/14/2013, 10:04 AM

## 2013-05-14 NOTE — ED Notes (Signed)
Shuvon NP into see

## 2013-05-14 NOTE — Progress Notes (Signed)
Patient did not attend the evening speaker [place] meeting. Pt had just arrived on unit and remained in her room. Pt was given a food tray upon request.

## 2013-05-14 NOTE — Progress Notes (Signed)
[name], female   DOB: 09/03/1961, 52 y.o.   MRN: [id number] 05-14-13 nursing adm note: pt [facility] to [facility]. She has been using cocaine for many yrs, has auditory hallucinations intermittently, as well as having passive suicide ideation. Her last use of cocaine was 2 days ago and she uses about $70.00 daily. She denies any etoh use. She is able to contract for safety. She had no pain on admission, smokes about 1/2 ppd and has on a nicotine patch. She has a medical hx of arthritis, a hernia repair, htn, anemia and a skin biopsy in 2010. She has a partner and recently left the [facility] and is wanting to rtn to live there, stating she made a mistake by leaving. She states she is presently homeless and does get a disability check. 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  She has done prison time in the past for b&e and breaking her probation. She was very polite/cooperative and escorted to the [address]. Report was given to liz,RN   Emergency contact:none listed.

## 2013-05-14 NOTE — Progress Notes (Signed)
Patient accepted to [facility] bed 306/1 by Earleen Newport, NP. Clayborne Dana, RN

## 2013-05-14 NOTE — ED Notes (Signed)
Waiting for security to transport to [facility]

## 2013-05-14 NOTE — ED Notes (Signed)
Pt can not transfer to [facility] until after 1900 per [facility]

## 2013-05-14 NOTE — Progress Notes (Signed)
D.  Pt is new admission, see admission note.  Resting in bed on approach.  Pt stated that she has pain in both ears, swollen, discolored.  Pt states that this is due to crack and thinks she is allergic to whatever was used to cut the crack she has used.  Denies SI/HI/hallucinations at this time, though she does at times hear voices.  A.  Support and encouragement offered, pain medication given as ordered for ear pain.  R.  Pt remains safe on unit, will continue to monitor.

## 2013-05-14 NOTE — ED Notes (Addendum)
Security can transport to [facility] in 10 mins, [facility] notified

## 2013-05-14 NOTE — ED Notes (Signed)
Up to the bathroom to shower and change scrubs

## 2013-05-14 NOTE — Tx Team (Signed)
Initial Interdisciplinary Treatment Plan  PATIENT STRENGTHS: (choose at least two) Communication skills General fund of knowledge Motivation for treatment/growth Supportive family/friends  PATIENT STRESSORS: [facility] of home at [facility] Marital or family conflict Substance abuse   PROBLEM LIST: Problem List/Patient Goals Date to be addressed Date deferred Reason deferred Estimated date of resolution  Mood d/o nos 05-14-13           Cocaine dependence  05-14-13           Suicide ideation 05-14-13           Auditory hallucinations 05-14-13           Homeless presently  05-14-13      DISCHARGE CRITERIA:  Adequate post-discharge living arrangements Improved stabilization in mood, thinking, and/or behavior Motivation to continue treatment in a less acute level of care Verbal commitment to aftercare and medication compliance Withdrawal symptoms are absent or subacute and managed without 24-hour nursing intervention  PRELIMINARY DISCHARGE PLAN: Attend aftercare/continuing care group Attend 12-step recovery group  PATIENT/FAMIILY INVOLVEMENT: This treatment plan has been presented to and reviewed with the patient, [name], and/or family member, .  The patient and family have been given the opportunity to ask questions and make suggestions.  Pricilla Larsson 05/14/2013, 7:55 PM

## 2013-05-14 NOTE — ED Notes (Signed)
Dr Dwyane Dee and Delphia Grates NP into see

## 2013-05-14 NOTE — ED Notes (Signed)
Pt reports recurrent sores on her bottom from laying--multiple healed areas noted, small approx 1x.5 cm area noted inner upper rt buttock, no drainage noted, but pt reports it has been bleeding at times.

## 2013-05-14 NOTE — ED Notes (Signed)
Up to the bathroom 

## 2013-05-14 NOTE — ED Notes (Signed)
Pt ambulatory to [facility] w/ security and [facility]t, belongings sent w/ [facility]

## 2013-05-15 ENCOUNTER — Encounter (HOSPITAL_COMMUNITY): Payer: Self-pay | Admitting: Psychiatry

## 2013-05-15 DIAGNOSIS — F29 Unspecified psychosis not due to a substance or known physiological condition: Secondary | ICD-10-CM | POA: Diagnosis present

## 2013-05-15 DIAGNOSIS — F1420 Cocaine dependence, uncomplicated: Secondary | ICD-10-CM | POA: Diagnosis present

## 2013-05-15 MED ORDER — ADULT MULTIVITAMIN W/MINERALS CH
1.0000 | ORAL_TABLET | Freq: Every day | ORAL | Status: DC
  Administered 2013-05-15 – 2013-05-17 (×3): 1 via ORAL
  Filled 2013-05-15 (×5): qty 1

## 2013-05-15 MED ORDER — ADULT MULTIVITAMIN W/MINERALS CH
1.0000 | ORAL_TABLET | Freq: Every day | ORAL | Status: DC
  Filled 2013-05-15 (×2): qty 1

## 2013-05-15 MED ORDER — ENSURE COMPLETE PO LIQD
237.0000 mL | Freq: Three times a day (TID) | ORAL | Status: DC
  Administered 2013-05-15 – 2013-05-16 (×4): 237 mL via ORAL

## 2013-05-15 MED ORDER — ENSURE COMPLETE PO LIQD: 237.0000 mL | Freq: Three times a day (TID) | ORAL | Status: DC

## 2013-05-15 NOTE — BHH Group Notes (Signed)
[facility] LCSW Aftercare Discharge Planning Group Note   05/15/2013 8:45 AM  Participation Quality:  Did Not Attend   [name], [place] 05/15/2013 9:46 AM

## 2013-05-15 NOTE — BHH Group Notes (Signed)
[facility] LCSW Group Therapy  05/15/2013  1:15 PM   Type of Therapy:  Group Therapy  Participation Level:  Did Not Attend  [name], [place] 05/15/2013 2:20 PM

## 2013-05-15 NOTE — BHH Suicide Risk Assessment (Signed)
Suicide Risk Assessment  Admission Assessment     Nursing information obtained from:  Patient Demographic factors:  [name], lesbian, or bisexual orientation;Low socioeconomic status;Unemployed Current Mental Status:  NA (denies si/hi/av presently ) Loss Factors:  Loss of significant relationship;Legal issues;Financial problems / change in socioeconomic status Historical Factors:  Family history of mental illness or substance abuse;Domestic violence in family of origin Risk Reduction Factors:  Religious beliefs about death;Positive social support  CLINICAL FACTORS:   Alcohol/Substance Abuse/Dependencies Currently Psychotic Medical Diagnoses and Treatments/Surgeries  COGNITIVE FEATURES THAT CONTRIBUTE TO RISK:  Closed-mindedness Polarized thinking Thought constriction (tunnel vision)    SUICIDE RISK:   Moderate:  Frequent suicidal ideation with limited intensity, and duration, some specificity in terms of plans, no associated intent, good self-control, limited dysphoria/symptomatology, some risk factors present, and identifiable protective factors, including available and accessible social support.  PLAN OF CARE: Supportive approach/coping skills/relapse prevention                               Reassess and address the co morbities  I certify that inpatient services furnished can reasonably be expected to improve the patient's condition.  Allysa Governale A 05/15/2013, 12:49 PM

## 2013-05-15 NOTE — Progress Notes (Signed)
Adult Psychoeducational Group Note  Date:  05/15/2013 Time:  11:00AM Group Topic/Focus:  Self Care:   The focus of this group is to help patients understand the importance of self-care in order to improve or restore emotional, physical, spiritual, interpersonal, and financial health.  Participation Level:  Active  Participation Quality:  Appropriate and Attentive  Affect:  Appropriate  Cognitive:  Alert and Appropriate  Insight: Appropriate  Engagement in Group:  Engaged  Modes of Intervention:  Discussion  Additional Comments:  Pt. Was attentive and appropriate during today's group discussion. Pt. Was able to complete self care assessment. Pt. Shared that she need to improve on eating healthy and regularly. Pt. Shared however she do get enough sleep. Pt stated that she is willing to journal so that she is able to learn how to deal with taking better care of herself.   Theodoro Grist D 05/15/2013, 1:51 PM

## 2013-05-15 NOTE — H&P (Signed)
Psychiatric Admission Assessment Adult  Patient Identification:  [name] Date of Evaluation:  05/15/2013 Chief Complaint:  Mood Disorder NOS 296.90 Cocaine Dependence 304.20 History of Present Illness::Came to the Ed complaining of ear pain. She also endroesed that she was hearing voices. Hers them when she uses cocaine and when she does not. . At times they tell her to hurt herself by using cocaine. The voices also tell her to hurt other people. She also admits she sees images sometimes Elements:  Location:  in patient. Quality:  unable to function. Severity:  severe. Timing:  every day. Duration:  building up last severel weeks. Context:  Active use of cocaine, underlying psychotic disorder with multiple social needs. Associated Signs/Synptoms: Depression Symptoms:  depressed mood, anhedonia, anxiety, panic attacks, insomnia, loss of energy/fatigue, disturbed sleep, weight loss, decreased appetite, (Hypo) Manic Symptoms:  Irritable Mood, Anxiety Symptoms:  Excessive Worry, Panic Symptoms, Psychotic Symptoms:  Hallucinations: Auditory Visual Paranoia, PTSD Symptoms: Negative  Psychiatric Specialty Exam: Physical Exam  Review of Systems  Constitutional: Positive for weight loss and malaise/fatigue.  HENT: Positive for ear pain and neck pain.   Eyes: Positive for blurred vision.  Respiratory: Positive for cough, hemoptysis and sputum production.        Pack a day  Cardiovascular: Positive for chest pain.  Gastrointestinal: Positive for abdominal pain.  Genitourinary: Positive for dysuria.  Musculoskeletal: Positive for back pain and joint pain.       Feet burn  Skin: Negative.   Neurological: Positive for weakness and headaches.  Endo/Heme/Allergies: Negative.   Psychiatric/Behavioral: Positive for depression, hallucinations and substance abuse. The patient is nervous/anxious and has insomnia.     Blood pressure 130/92, pulse 106, temperature 96.9 F (36.1 C),  temperature source Oral, resp. rate 16, height 5' 1"  (1.549 m), weight 42.638 kg (94 lb), SpO2 100.00%.Body mass index is 17.77 kg/(m^2).  General Appearance: Disheveled  Eye Contact::  Minimal  Speech:  Slow, Slurred and not spontaneous  Volume:  Decreased  Mood:  Anxious, Depressed and worried  Affect:  sad , anxious, worried  Thought Process:  Coherent, Goal Directed and answers questions  Orientation:  Other:  person, place  Thought Content:  not spontaneous content, answers questions  Suicidal Thoughts:  No  Homicidal Thoughts:  No  Memory:  Immediate;   Fair Recent;   Fair Remote;   Fair  Judgement:  Fair  Insight:  Shallow  Psychomotor Activity:  Restlessness  Concentration:  Poor  Recall:  Poor  Akathisia:  No  Handed:  Right  AIMS (if indicated):     Assets:  Desire for Improvement  Sleep:  Number of Hours: 5.5    Past Psychiatric History: Diagnosis:  Hospitalizations: Denies  Outpatient Care:  Substance Abuse Care:  Self-Mutilation:Denies  Suicidal Attempts: Denies  Violent Behaviors: Denies    Past Medical History:   Past Medical History  Diagnosis Date  . 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 Followed by Dr. Louanne Skye  . Hypertension   . Cocaine abuse     ongoing with resultant vaculitis.  . Tobacco abuse   . Depression   . Normocytic anemia     BL Hgb 9.8-12. Last anemia panel 04/2010 - showing Fe 19, ferritin 101.  Pt on monthly B12 injections  . Rheumatoid arthritis(714.0)     patient reported  . Inflammatory arthritis    Loss of Consciousness:  hit head, "lost one of her screws up there" Traumatic Brain Injury:  fell Allergies:  Allergies  Allergen Reactions  . Acetaminophen Swelling    REACTION: eyelid swelling   PTA Medications: No prescriptions prior to admission    Previous Psychotropic Medications:  Medication/Dose  Prozac, Trazodone, Zoloft,                Substance Abuse History in the last 12 months:   yes  Consequences of Substance Abuse: Medical Consequences:  skin lesions Blackouts:    Social History:  reports that she has been smoking Cigarettes.  She has a 19.5 pack-year smoking history. She has never used smokeless tobacco. She reports that she uses illicit drugs ("Crack" cocaine and Cocaine). She reports that she does not drink alcohol. Additional Social History: Pain Medications: denies Prescriptions: denies  Over the Counter: denies History of alcohol / drug use?: Yes (uses cocaine; denies etoh use ) Longest period of sobriety (when/how long): from 1001-2007 and 1994-2000 Negative Consequences of Use: Legal;Financial;Personal relationships Withdrawal Symptoms: Other (Comment) (none ) Name of Substance 1: cocaine  1 - Age of First Use: 18 or 19 1 - Amount (size/oz): approx $70.00  1 - Frequency: daily  1 - Duration: 4 yrs 1 - Last Use / Amount: 05-12-13                  Current Place of Residence:  [place] the street ( left [employer], go and  left) Place of Birth:   Family Members: Marital Status:  Single Children: None  Sons:  Daughters: Relationships: Education:  11 th quit Educational Problems/Performance: Religious Beliefs/Practices: Reads her Bible History of Abuse (Emotional/Phsycial/Sexual) " I don t know" Occupational Experiences; 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 Legal History: Panhandling charge. Fourteen years in prison, robbery, larceny Hobbies/Interests:  Family History:   Family History  Problem Relation Age of Onset  . Breast cancer Mother     Breast cancer  . Alcohol abuse Mother   . Colon cancer Maternal Aunt 68  . Alcohol abuse Father     Results for orders placed during the hospital encounter of 05/13/13 (from the past 72 hour(s))  CBC     Status: Abnormal   Collection Time    05/13/13  5:39 AM      Result Value Range   WBC 3.2 (*) 4.0 - 10.5 K/uL   RBC 3.83 (*) 3.87 - 5.11 MIL/uL   Hemoglobin  11.4 (*) 12.0 - 15.0 g/dL   HCT 32.1 (*) 36.0 - 46.0 %   MCV 83.8  78.0 - 100.0 fL   MCH 29.8  26.0 - 34.0 pg   MCHC 35.5  30.0 - 36.0 g/dL   RDW 13.9  11.5 - 15.5 %   Platelets 271  150 - 400 K/uL  POCT I-STAT TROPONIN I     Status: None   Collection Time    05/13/13  5:46 AM      Result Value Range   Troponin i, poc 0.01  0.00 - 0.08 ng/mL   Comment 3            Comment: Due to the release kinetics of cTnI,     a negative result within the first hours     of the onset of symptoms does not rule out     myocardial infarction with certainty.     If myocardial infarction is still suspected,     repeat the test at appropriate intervals.  POCT I-STAT, CHEM 8     Status: Abnormal  Collection Time    05/13/13  5:48 AM      Result Value Range   Sodium 140  135 - 145 mEq/L   Potassium 3.4 (*) 3.5 - 5.1 mEq/L   Chloride 106  96 - 112 mEq/L   BUN 13  6 - 23 mg/dL   Creatinine, Ser 0.80  0.50 - 1.10 mg/dL   Glucose, Bld 106 (*) 70 - 99 mg/dL   Calcium, Ion 1.20  1.12 - 1.23 mmol/L   TCO2 22  0 - 100 mmol/L   Hemoglobin 10.9 (*) 12.0 - 15.0 g/dL   HCT 32.0 (*) 36.0 - 46.0 %  ETHANOL     Status: None   Collection Time    05/13/13  7:11 AM      Result Value Range   Alcohol, Ethyl (B) <11  0 - 11 mg/dL   Comment:            LOWEST DETECTABLE LIMIT FOR     SERUM ALCOHOL IS 11 mg/dL     FOR MEDICAL PURPOSES ONLY  URINE RAPID DRUG SCREEN (HOSP PERFORMED)     Status: Abnormal   Collection Time    05/13/13  7:31 AM      Result Value Range   Opiates NONE DETECTED  NONE DETECTED   Cocaine POSITIVE (*) NONE DETECTED   Benzodiazepines NONE DETECTED  NONE DETECTED   Amphetamines NONE DETECTED  NONE DETECTED   Tetrahydrocannabinol NONE DETECTED  NONE DETECTED   Barbiturates NONE DETECTED  NONE DETECTED   Comment:            DRUG SCREEN FOR MEDICAL PURPOSES     ONLY.  IF CONFIRMATION IS NEEDED     FOR ANY PURPOSE, NOTIFY LAB     WITHIN 5 DAYS.                LOWEST DETECTABLE LIMITS      FOR URINE DRUG SCREEN     Drug Class       Cutoff (ng/mL)     Amphetamine      1000     Barbiturate      200     Benzodiazepine   160     Tricyclics       109     Opiates          300     Cocaine          300     THC              50  POCT I-STAT TROPONIN I     Status: None   Collection Time    05/13/13 10:13 AM      Result Value Range   Troponin i, poc 0.02  0.00 - 0.08 ng/mL   Comment 3            Comment: Due to the release kinetics of cTnI,     a negative result within the first hours     of the onset of symptoms does not rule out     myocardial infarction with certainty.     If myocardial infarction is still suspected,     repeat the test at appropriate intervals.   Psychological Evaluations:  Assessment:   AXIS I:  Cocaine Dependence, Psychotic Disorder NOS AXIS II:  Deferred AXIS III:   Past Medical History  Diagnosis Date  . 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 Followed by Dr. Louanne Skye  .  Hypertension   . Cocaine abuse     ongoing with resultant vaculitis.  . Tobacco abuse   . Depression   . Normocytic anemia     BL Hgb 9.8-12. Last anemia panel 04/2010 - showing Fe 19, ferritin 101.  Pt on monthly B12 injections  . Rheumatoid arthritis(714.0)     patient reported  . Inflammatory arthritis    AXIS IV:  economic problems, housing problems, occupational problems, other psychosocial or environmental problems, problems related to social environment and problems with primary support group AXIS V:  41-50 serious symptoms  Treatment Plan/Recommendations:  Supportive approach/coping skills/relapse prevention                                                                 Reassess and address the co morbidities  Treatment Plan Summary: Daily contact with patient to assess and evaluate symptoms and progress in treatment Medication management Current Medications:  Current Facility-Administered Medications  Medication Dose Route Frequency Provider Last Rate Last  Dose  . alum & mag hydroxide-simeth (MAALOX/MYLANTA) 200-200-20 MG/5ML suspension 30 mL  30 mL Oral Q4H PRN Shuvon Rankin, NP      . ibuprofen (ADVIL,MOTRIN) tablet 600 mg  600 mg Oral Q6H PRN Dara Hoyer, PA-C   600 mg at 05/15/13 0820  . LORazepam (ATIVAN) tablet 1 mg  1 mg Oral Q8H PRN Shuvon Rankin, NP   1 mg at 05/15/13 0820  . magnesium hydroxide (MILK OF MAGNESIA) suspension 30 mL  30 mL Oral Daily PRN Shuvon Rankin, NP      . nicotine (NICODERM CQ - dosed in mg/24 hours) patch 21 mg  21 mg Transdermal Daily Shuvon Rankin, NP   21 mg at 05/15/13 0819  . nitroGLYCERIN (NITROSTAT) SL tablet 0.4 mg  0.4 mg Sublingual Q5 min PRN Shuvon Rankin, NP        Observation Level/Precautions:  15 minute checks  Laboratory:  As per the ED  Psychotherapy:  Individual/group  Medications:  Assess for detox/reassess for psychotropics  Consultations:    Discharge Concerns:  3-5 days  Estimated LOS:  Other:     I certify that inpatient services furnished can reasonably be expected to improve the patient's condition.   [place] A 8/11/20149:20 AM

## 2013-05-15 NOTE — Progress Notes (Signed)
Pt is resting in her bed. Pt had requested ativan for anxiety and ibuprofen for pain. Pt currently denies pain. She denies si and hi. Safety maintained.

## 2013-05-15 NOTE — Progress Notes (Signed)
Pt didn't attended group

## 2013-05-15 NOTE — Progress Notes (Signed)
NUTRITION ASSESSMENT  Patient meets criteria for severe malnutrition related to social/environmental causes AEB 15% weight loss, decreased body fat.  Pt identified as at risk on the Malnutrition Screen Tool  INTERVENTION: 1. Educated patient on the importance of nutrition and encouraged intake of food and beverages. 2. Discussed weight goals. 3. Supplements: Ensure Complete po BID, each supplement provides 350 kcal and 13 grams of protein. MVI daily  NUTRITION DIAGNOSIS: Unintentional weight loss related to sub-optimal intake as evidenced by pt report.   Goal: Pt to meet >/= 90% of their estimated nutrition needs.  Monitor:  PO intake  Assessment:  52 yo F admitted with cocaine abuse.   IBW 105 lbs.  Patient is 90% IBW.  Underweight based on BMI.   Patient reports OK appetite.  Did not eat lunch today.  Would not answer questions regarding usual intake but stated that UBW was 110 lbs.  Patient is 85% of usual body weight.  52 y.o. female  Height: Ht Readings from Last 1 Encounters:  05/14/13 5' 1"  (1.549 m)    Weight: Wt Readings from Last 1 Encounters:  05/14/13 94 lb (42.638 kg)    Weight Hx: Wt Readings from Last 10 Encounters:  05/14/13 94 lb (42.638 kg)  05/13/13 99 lb (44.906 kg)  04/18/13 100 lb (45.36 kg)  02/18/13 106 lb (48.081 kg)  12/28/12 106 lb (48.081 kg)  08/31/12 106 lb 9.6 oz (48.353 kg)  07/01/12 100 lb 8 oz (45.587 kg)  06/28/12 99 lb 8 oz (45.133 kg)  05/20/12 98 lb 11.2 oz (44.77 kg)  02/14/12 98 lb 1.7 oz (44.5 kg)    BMI:  Body mass index is 17.77 kg/(m^2). Pt meets criteria for underweight based on current BMI.  Estimated Nutritional Needs: Kcal: 25-30 kcal/kg Protein: > 1 gram protein/kg Fluid: 1 ml/kcal  Diet Order: General Pt is also offered choice of unit snacks mid-morning and mid-afternoon.  Pt is eating as desired.   Lab results and medications reviewed.   Antonieta Iba, RD, LDN Clinical Inpatient Dietitian Pager:   [phone] Weekend and after hours pager:  [phone]

## 2013-05-15 NOTE — Tx Team (Signed)
Interdisciplinary Treatment Plan Update (Adult)  Date: 05/15/2013  Time Reviewed:  9:45 AM  Progress in Treatment: Attending groups: Yes Participating in groups:  Yes Taking medication as prescribed:  Yes Tolerating medication:  Yes Family/Significant othe contact made: CSW assessing Patient understands diagnosis:  Yes Discussing patient identified problems/goals with staff:  Yes Medical problems stabilized or resolved:  Yes Denies suicidal/homicidal ideation: Yes Issues/concerns per patient self-inventory:  Yes Other:  New problem(s) identified: N/A  Discharge Plan or Barriers: CSW assessing for appropriate referrals.    Reason for Continuation of Hospitalization: Anxiety Depression Medication Stabilization  Comments: N/A  Estimated length of stay: 3-5 days  For review of initial/current patient goals, please see plan of care.  Attendees: Patient:     Family:     Physician:  Dr. Sabra Heck 05/15/2013 10:21 AM   Nursing:   Satira Sark, RN 05/15/2013 10:21 AM   Clinical Social Worker:  [name], [place] 05/15/2013 10:21 AM   Other: Para March, RN 05/15/2013 10:21 AM   Other:  Maxie Better, LCSWA 05/15/2013 10:21 AM   Other:  Norberto Sorenson, care manager 05/15/2013 10:21 AM   Other:     Other:    Other:    Other:    Other:    Other:    Other:     Scribe for Treatment Team:   Ane Payment, 05/15/2013 , 10:21 AM

## 2013-05-15 NOTE — Consult Note (Signed)
Patient seen, evaluated and plan done by me

## 2013-05-16 DIAGNOSIS — R443 Hallucinations, unspecified: Secondary | ICD-10-CM

## 2013-05-16 NOTE — Progress Notes (Signed)
D: Pt in bed resting with eyes closed for the majority of the shift. Pt's respirations were even and unlabored. Pt appeared to be in no signs of distress during those times. Eventually pt was alert for a short time period to speak with this Probation officer. Pt denied any concerns she wished for this writer to address. Pt denied any psychosocial symptoms. She was negative for any SI/HI.  A:  Continued support and availability as needed was extended to this pt. Staff continue to monitor pt with q18mn checks.  R: Pt remains safe at this time.

## 2013-05-16 NOTE — Progress Notes (Signed)
Patient did not attend 0900 hr RN group.

## 2013-05-16 NOTE — Progress Notes (Signed)
[facility] MD Progress Note  05/16/2013 4:19 PM [name]  MRN:  [id number] Subjective:  [name] states that she is committed to abstain from using cocaine. When asked what will trigger her using again, she becomes teary eyed. Not too forthcoming. She states she could have a place to go back to. She will wait until the beginning of the month before trying to get back to [facility]. She is very reserved, guarded. Diagnosis:  Cocaine Dependence, Hallucinations  ADL's:  Intact  Sleep: Fair  Appetite:  Poor  Suicidal Ideation:  Plan:  denies Intent:  denies Means:  denies Homicidal Ideation:  Plan:  denies Intent:  denies Means:  denies AEB (as evidenced by):  Psychiatric Specialty Exam: Review of Systems  Constitutional: Negative.   HENT: Negative.   Eyes: Negative.   Respiratory: Negative.   Cardiovascular: Negative.   Gastrointestinal: Negative.   Genitourinary: Negative.   Musculoskeletal: Negative.   Skin: Negative.   Neurological: Negative.   Endo/Heme/Allergies: Negative.   Psychiatric/Behavioral: Positive for substance abuse. The patient is nervous/anxious.     Blood pressure 118/81, pulse 91, temperature 97.3 F (36.3 C), temperature source Oral, resp. rate 18, height 5' 1"  (1.549 m), weight 42.638 kg (94 lb), SpO2 100.00%.Body mass index is 17.77 kg/(m^2).  General Appearance: Disheveled  Eye Sport and exercise psychologist::  Fair  Speech:  Clear and Coherent and Slow  Volume:  Decreased  Mood:  Depressed  Affect:  Restricted  Thought Process:  Coherent and Goal Directed  Orientation:  Full (Time, Place, and Person)  Thought Content:  not a lot of spontaneous content, very concrete  Suicidal Thoughts:  No  Homicidal Thoughts:  No  Memory:  Immediate;   Fair Recent;   Fair Remote;   Fair  Judgement:  Fair  Insight:  Shallow  Psychomotor Activity:  Psychomotor Retardation  Concentration:  Fair  Recall:  Fair  Akathisia:  No  Handed:  Right  AIMS (if indicated):     Assets:  Desire  for Improvement  Sleep:  Number of Hours: 7.25   Current Medications: Current Facility-Administered Medications  Medication Dose Route Frequency Provider Last Rate Last Dose  . alum & mag hydroxide-simeth (MAALOX/MYLANTA) 200-200-20 MG/5ML suspension 30 mL  30 mL Oral Q4H PRN Shuvon Rankin, NP      . feeding supplement (ENSURE COMPLETE) liquid 237 mL  237 mL Oral TID BM Darrol Jump, RD   237 mL at 05/16/13 1449  . ibuprofen (ADVIL,MOTRIN) tablet 600 mg  600 mg Oral Q6H PRN Dara Hoyer, PA-C   600 mg at 05/16/13 1449  . LORazepam (ATIVAN) tablet 1 mg  1 mg Oral Q8H PRN Shuvon Rankin, NP   1 mg at 05/15/13 1816  . magnesium hydroxide (MILK OF MAGNESIA) suspension 30 mL  30 mL Oral Daily PRN Shuvon Rankin, NP      . multivitamin with minerals tablet 1 tablet  1 tablet Oral Daily Darrol Jump, RD   1 tablet at 05/16/13 0805  . nicotine (NICODERM CQ - dosed in mg/24 hours) patch 21 mg  21 mg Transdermal Daily Shuvon Rankin, NP   21 mg at 05/16/13 0806  . nitroGLYCERIN (NITROSTAT) SL tablet 0.4 mg  0.4 mg Sublingual Q5 min PRN Shuvon Rankin, NP        Lab Results: No results found for this or any previous visit (from the past 48 hour(s)).  Physical Findings: AIMS: Facial and Oral Movements Muscles of Facial Expression: None, normal Lips and Perioral  Area: None, normal Jaw: None, normal Tongue: None, normal,Extremity Movements Upper (arms, wrists, hands, fingers): None, normal Lower (legs, knees, ankles, toes): None, normal, Trunk Movements Neck, shoulders, hips: None, normal, Overall Severity Severity of abnormal movements (highest score from questions above): None, normal Incapacitation due to abnormal movements: None, normal Patient's awareness of abnormal movements (rate only patient's report): No Awareness, Dental Status Current problems with teeth and/or dentures?: No Does patient usually wear dentures?: No  CIWA:    COWS:     Treatment Plan Summary: Daily contact with  patient to assess and evaluate symptoms and progress in treatment Medication management  Plan: Supportive approach/coping skills/relapse prevention           Reassess and address the comorbidities  Medical Decision Making Problem Points:  Review of psycho-social stressors (1) Data Points:  Review of medication regiment & side effects (2)  I certify that inpatient services furnished can reasonably be expected to improve the patient's condition.   [place] A 05/16/2013, 4:19 PM

## 2013-05-16 NOTE — Progress Notes (Signed)
[name], female   DOB: Jul 28, 1961, 52 y.o.   MRN: [id number] She has been up for short periods of time then in bed most of shift. She did go to one group after being encouraged. She has not requested and prn medication today.

## 2013-05-16 NOTE — BHH Suicide Risk Assessment (Signed)
[facility] Adult Inpatient Family/Significant Other Suicide Prevention Education  Suicide Prevention Education:   Patient Refusal for Family/Significant Other Suicide Prevention Education: The patient has refused to provide written consent for family/significant other to be provided Family/Significant Other Suicide Prevention Education during admission and/or prior to discharge.  Physician notified.  CSW provided suicide prevention information with patient.    The suicide prevention education provided includes the following:  Suicide risk factors  Suicide prevention and interventions  National Suicide Hotline telephone number  [facility] assessment telephone number  [facility] Emergency Assistance [place] and/or Residential Mobile Crisis Unit telephone number   [name], [place] 05/16/2013 9:20 AM

## 2013-05-16 NOTE — Progress Notes (Signed)
Recreation Therapy Notes  Date: 08.12.2014 Time: 2:30pm Location:[address] [place] [address]  Group Topic: Animal Assisted Activities  Goal Area(s) Addresses:  Patient will interact appropriately with dog team.    Behavioral Response: Did not attend  Lane Hacker, LRT/CTRS  Lane Hacker 05/16/2013 4:50 PM
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
## 2013-05-16 NOTE — BHH Group Notes (Signed)
[place] LCSW Group Therapy  05/16/2013 2:28 PM  Type of Therapy:  Group Therapy  Participation Level:  Minimal  Participation Quality:  Drowsy  Affect:  Blunted  Cognitive:  Disorganized  Insight:  Lacking  Engagement in Therapy:  Lacking  Modes of Intervention:  Discussion, Education, Socialization and Support  Summary of Progress/Problems: [facility] Speaker came to talk about his personal journey with substance abuse and addiction. The pt processed ways by which to relate to the speaker. [place] speaker provided handouts and educational information pertaining to groups and services offered by the [facility]. Wenonah was inattentive and drowsy throughout the day's group. She arrived approximately 15 minutes late to group. Hailee had her eyes closed through the majority of the day's group but did show interest in the [facility] pamphlets given to her. She followed along as the speaker reviewed groups offered by [facility]. Yee shows limited progress in the group setting AEB her inattention and lack of participation in today's group.    [place], [employer] [place] 05/16/2013, 2:28 PM

## 2013-05-16 NOTE — Progress Notes (Signed)
D- Patient is quiet and guarded.  Is attending groups with good attention but minimal interaction.  No physical complaints and no prn medications requested.  Denies SI and contracts for safety.  A- Support and encouragement given.  Continue POC and evaluation of treatment goals.  R- Safety maintained.

## 2013-05-16 NOTE — Progress Notes (Signed)
Recreation Therapy Notes  Date: 08.12.2014 Time: 3:00pm Location: [address]   Group Topic: Problem Solving, Decision Making, Communication  Goal Area(s) Addresses:  Patient will effectively work together to accomplish a shared goal. Patient will verbalize skills needed to make activity successful.  Patient will verbalize ways to use skills used in group session post d/c.  Behavioral Response: Did not attend. Per MHT patient refused group session.   Laureen Ochs Hendry Speas, LRT/CTRS   Silvia Markuson L 05/16/2013 4:05 PM

## 2013-05-17 NOTE — Progress Notes (Signed)
Adult Psychoeducational Group Note  Date:  05/17/2013 Time:  10:00am Group Topic/Focus:  Therapeutic Activity  Participation Level:  Active  Participation Quality:  Appropriate and Attentive  Affect:  Appropriate  Cognitive:  Alert and Appropriate  Insight: Appropriate  Engagement in Group:  Engaged  Modes of Intervention:  Discussion and Education  Additional Comments: Pt attended and participated in group with appropriate responses.  Marlowe Shores D 05/17/2013, 11:19 AM

## 2013-05-17 NOTE — Progress Notes (Signed)
Adult Psychoeducational Group Note  Date:  05/17/2013 Time:  7:00 PM  Group Topic/Focus:  Personal Choices and Values:   The focus of this group is to help patients assess and explore the importance of values in their lives, how their values affect their decisions, how they express their values and what opposes their expression.  Participation Level:  Minimal  Participation Quality:  Drowsy  Affect:  Flat  Cognitive:  Appropriate  Insight: None  Engagement in Group:  None  Modes of Intervention:  Discussion, Education and Support  Additional Comments:  Pt attended group, but did not participate.   Arbie Cookey K 05/17/2013, 7:00 PM

## 2013-05-17 NOTE — BHH Suicide Risk Assessment (Signed)
Suicide Risk Assessment  Discharge Assessment     Demographic Factors:  Adolescent or young adult, Low socioeconomic status, Living alone and Unemployed  Mental Status Per Nursing Assessment::   On Admission:  NA (denies si/hi/av presently )  Current Mental Status by Physician: Mental Status Examination: Patient appeared as per his stated age, casually dressed, and fairly groomed, and maintaining good eye contact. Patient has good mood and his affect was constricted. He has normal rate, rhythm, and volume of speech. His thought process is linear and goal directed. Patient has denied suicidal, homicidal ideations, intentions or plans. Patient has no evidence of auditory or visual hallucinations, delusions, and paranoia. Patient has fair insight judgment and impulse control.  Loss Factors: Financial problems/change in socioeconomic status  Historical Factors: Impulsivity  Risk Reduction Factors:   Sense of responsibility to family, Religious beliefs about death, Living with another person, especially a relative, Positive social support, Positive therapeutic relationship and Positive coping skills or problem solving skills  Continued Clinical Symptoms:  Depression:   Comorbid alcohol abuse/dependence Recent sense of peace/wellbeing Alcohol/Substance Abuse/Dependencies Previous Psychiatric Diagnoses and Treatments  Cognitive Features That Contribute To Risk:  Polarized thinking    Suicide Risk:  Minimal: No identifiable suicidal ideation.  Patients presenting with no risk factors but with morbid ruminations; may be classified as minimal risk based on the severity of the depressive symptoms  Discharge Diagnoses:   AXIS I:  Substance Induced Mood Disorder and Cocaine dependence AXIS II:  Deferred AXIS III:   Past Medical History  Diagnosis Date  . 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 Followed by Dr. Louanne Skye  . Hypertension   . Cocaine abuse     ongoing with resultant  vaculitis.  . Tobacco abuse   . Depression   . Normocytic anemia     BL Hgb 9.8-12. Last anemia panel 04/2010 - showing Fe 19, ferritin 101.  Pt on monthly B12 injections  . Rheumatoid arthritis(714.0)     patient reported  . Inflammatory arthritis    AXIS IV:  economic problems, occupational problems, other psychosocial or environmental problems, problems related to social environment and problems with primary support group AXIS V:  61-70 mild symptoms  Plan Of Care/Follow-up recommendations:  Activity:  As tolerated Diet:  Regular  Is patient on multiple antipsychotic therapies at discharge:  No   Has Patient had three or more failed trials of antipsychotic monotherapy by history:  No  Recommended Plan for Multiple Antipsychotic Therapies: Not applicable  Durward Parcel., M.D. 05/17/2013, 12:13 PM

## 2013-05-17 NOTE — Progress Notes (Signed)
[name], female   DOB: 1961-09-17, 52 y.o.   MRN: [id number] She has been discharged and was going to a friends house tonight and then [facility]. She voiced understanding of discharge teaching about the follow up olan. She denies thoughts to harm self and all belongings were taken home with her.

## 2013-05-17 NOTE — Tx Team (Signed)
Interdisciplinary Treatment Plan Update (Adult)  Date: 05/17/2013  Time Reviewed:  9:45 AM  Progress in Treatment: Attending groups: Yes Participating in groups:  Yes Taking medication as prescribed:  Yes Tolerating medication:  Yes Family/Significant othe contact made: No, pt refused Patient understands diagnosis:  Yes Discussing patient identified problems/goals with staff:  Yes Medical problems stabilized or resolved:  Yes Denies suicidal/homicidal ideation: Yes Issues/concerns per patient self-inventory:  Yes Other:  New problem(s) identified: N/A  Discharge Plan or Barriers: Pt will follow up at [facility] for medication management and therapy.    Reason for Continuation of Hospitalization: Stable to d/c  Comments: N/A  Estimated length of stay: D/C today  For review of initial/current patient goals, please see plan of care.  Attendees: Patient:     Family:     Physician:  05/17/2013 10:20 AM   Nursing:   Lawernce Pitts, RN 05/17/2013 10:20 AM   Clinical Social Worker:  [name], [place] 05/17/2013 10:20 AM   Other: Satira Sark, RN 05/17/2013 10:20 AM   Other:  Maxie Better, LCSWA 05/17/2013 10:20 AM   Other:  Norberto Sorenson, care coordinator 05/17/2013 10:20 AM  Other:  Agustina Caroli, NP 05/17/2013 10:20 AM  Other:    Other:    Other:    Other:    Other:    Other:     Scribe for Treatment Team:   Ane Payment, 05/17/2013 , 10:20 AM

## 2013-05-17 NOTE — BHH Group Notes (Signed)
[facility] LCSW Aftercare Discharge Planning Group Note   05/17/2013 8:45 AM  Participation Quality:  Alert and Appropriate   Mood/Affect:  Appropriate  Depression Rating:  0  Anxiety Rating:  0  Thoughts of Suicide:  Pt denies SI/HI  Will you contract for safety?   Yes  Current AVH: Pt denies  Plan for Discharge/Comments:  Pt attended discharge planning group and actively participated in group.  CSW provided pt with today's workbook.  Pt came to group as group was wrapping up.  Pt states that she is ready to d/c today.  Pt verbalizes d/c plan to remain homeless until she can get back into [facility].  CSW offered to help it get into [facility] and pt states that she'd rather d/c today and work on it from outside of the hospital.  Pt will follow up with [facility] for medication management and therapy.  No further needs voiced by pt at this time.    Transportation Means: Pt has access to transportation - provided pt with a bus pass  Supports: No supports mentioned at this time  [name], [place] 05/17/2013 11:53 AM

## 2013-05-17 NOTE — BHH Group Notes (Signed)
Adult Psychoeducational Group Note  Date:  05/17/2013 Time:  4:17 AM  Group Topic/Focus:  Wrap-Up Group:   The focus of this group is to help patients review their daily goal of treatment and discuss progress on daily workbooks.  Participation Level:  Minimal  Participation Quality:  Appropriate  Affect:  Appropriate  Cognitive:  Appropriate  Insight: Good  Engagement in Group:  Limited  Modes of Intervention:  Education  Additional Comments:  Patient actively listened to the representatives of NA/AA but didn't offer any input to the group discussions.  Russ Halo 05/17/2013, 4:17 AM

## 2013-05-17 NOTE — Discharge Summary (Signed)
Physician Discharge Summary Note  Patient:  [name] is an 52 y.o., female MRN:  [id number] DOB:  [birth year] Patient phone:  [phone] (home)  Patient address:   [address] [place] [address],   Date of Admission:  05/14/2013 Date of Discharge: 05/17/13  Reason for Admission: drug detox  Discharge Diagnoses: Active Problems:   Cocaine dependence   Psychosis  Review of Systems  Constitutional: Negative.   HENT: Negative.   Eyes: Negative.   Respiratory: Negative.   Cardiovascular: Negative.   Gastrointestinal: Negative.   Genitourinary: Negative.   Musculoskeletal: Negative.   Skin: Negative.   Neurological: Negative.   Endo/Heme/Allergies: Negative.   Psychiatric/Behavioral: Positive for substance abuse (Hx cocaine). Negative for depression, suicidal ideas, hallucinations and memory loss. The patient is nervous/anxious (Stabilized with medication prior to discharge). The patient does not have insomnia.    Axis Diagnosis:   AXIS I:  Cocaine dependence AXIS II:  Deferred AXIS III:   Past Medical History  Diagnosis Date  . 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 Followed by Dr. Louanne Skye  . Hypertension   . Cocaine abuse     ongoing with resultant vaculitis.  . Tobacco abuse   . Depression   . Normocytic anemia     BL Hgb 9.8-12. Last anemia panel 04/2010 - showing Fe 19, ferritin 101.  Pt on monthly B12 injections  . Rheumatoid arthritis(714.0)     patient reported  . Inflammatory arthritis    AXIS IV:  other psychosocial or environmental problems and Substance abuse issues AXIS V:  64  Level of Care:  OP  Hospital Course: Came to the Ed complaining of ear pain. She also endroesed that she was hearing voices. Hers them when she uses cocaine and when she does not. . At times they tell her to hurt herself by using cocaine. The voices also tell her to hurt other people. She also admits she sees images sometimes.  Wile a patient in this hospital and  after admission assessment/evaluation, it was determined based on patient's symptoms that her toxicology/UDS reports indicated no presence of alcohol rather cocaine. Although feeling depressed and upset, patient was not presenting any withdrawal symptoms of alcohol and or any other substances. Besides, Cocaine intoxication has no established detoxification treatment protocol. As a result, she received no such detoxification treatment protocol. However, it was determined that she will need PRN medication management to stabilize her current depressive mood/anxiety symptoms. She was then ordered and received Lorazepam 1 mg prn for anxiety. She also was enrolled in group counseling sessions and activities where she was counseled and learned coping skills that should help her cope better and manage her symptoms effectively after discharge. She received prn medication management and monitoring for her other minor medical issues and concerns. She tolerated her treatment regimen without any significant adverse effects and or reactions presented.   Patient did respond positively to her treatment regimen. This is evidenced by her daily reports of improved mood, reduction of symptoms and presentation of good affect/eye contact. She attended treatment team meeting this am and met with her treatment team members. Her reason for admission, present symptoms, response to treatment and discharge plans discussed with patient. [name] endorsed that her symptoms has stabilized and that she is ready for discharge to pursue psychiatric care on outpatient basis. It was then agreed upon that patient will follow-up care at the [facility] here in [place], [place] on 05/18/13, between the hours  of 08:00 am and 03:00 pm, Monday thru Friday. She has been instructed and informed that the appointment at [facility] is a walk-in appointment. The address, date, time and contact information for these clinics provided for patient in  writing.  Upon discharge, [name] adamantly denies any suicidal, homicidal ideations, auditory, visual hallucinations, paranoia and or delusional thoughts. She was provided with o days worth supply samples of her [facility] discharge medications. She left [facility] with all personal belongings in no apparent distress. Transportation per [place] bus. Bus pass provided  Consults:  psychiatry  Significant Diagnostic Studies:  labs: CBC with diff, CMP, UDS, Toxicology tests, U/A  Discharge Vitals:   Blood pressure 136/86, pulse 82, temperature 97.9 F (36.6 C), temperature source Oral, resp. rate 16, height 5' 1"  (1.549 m), weight 42.638 kg (94 lb), SpO2 100.00%. Body mass index is 17.77 kg/(m^2). Lab Results:   No results found for this or any previous visit (from the past 72 hour(s)).  Physical Findings: AIMS: Facial and Oral Movements Muscles of Facial Expression: None, normal Lips and Perioral Area: None, normal Jaw: None, normal Tongue: None, normal,Extremity Movements Upper (arms, wrists, hands, fingers): None, normal Lower (legs, knees, ankles, toes): None, normal, Trunk Movements Neck, shoulders, hips: None, normal, Overall Severity Severity of abnormal movements (highest score from questions above): None, normal Incapacitation due to abnormal movements: None, normal Patient's awareness of abnormal movements (rate only patient's report): No Awareness, Dental Status Current problems with teeth and/or dentures?: No Does patient usually wear dentures?: No  CIWA:    COWS:     Psychiatric Specialty Exam: See Psychiatric Specialty Exam and Suicide Risk Assessment completed by Attending Physician prior to discharge.  Discharge destination:  Home  Is patient on multiple antipsychotic therapies at discharge:  No   Has Patient had three or more failed trials of antipsychotic monotherapy by history:  No  Recommended Plan for Multiple Antipsychotic Therapies: NA     Medication List    Notice    You have not been prescribed any medications.     Follow-up Information   Follow up with [facility] On 05/18/2013. (Walk in on this date for hospital discharge appointment.  Walk in clinic is Monday - Friday 8 am - 3 pm. )    Contact information:   [address] [place] [address] Phone: [phone] Fax: [phone]     Follow-up recommendations:  Activity:  As tolerated Diet: As recommended by your primary care doctor. Keep all scheduled follow-up appointments as recommended.  Comments:  Take all your medications as prescribed by your mental healthcare provider. Report any adverse effects and or reactions from your medicines to your outpatient provider promptly. Patient is instructed and cautioned to not engage in alcohol and or illegal drug use while on prescription medicines. In the event of worsening symptoms, patient is instructed to call the crisis hotline, 911 and or go to the nearest ED for appropriate evaluation and treatment of symptoms. Follow-up with your primary care provider for your other medical issues, concerns and or health care needs.   Total Discharge Time:  Greater than 30 minutes.  Signed: Encarnacion Slates, PMHNP-BC 05/18/2013, 11:41 AM  Reviewed the information documented and agree with the treatment plan.  Janari Yamada,JANARDHAHA R. 05/19/2013 8:58 PM

## 2013-05-17 NOTE — Progress Notes (Signed)
[name], female   DOB: 20-Jan-1961, 52 y.o.   MRN: [id number]   D:Pt observed sleeping in bed with eyes closed. RR even and unlabored.  A: 1:1 observation continues for safety  R: pt remains safe

## 2013-05-17 NOTE — Progress Notes (Addendum)
[facility] Adult Case Management Discharge Plan :  Will you be returning to the same living situation after discharge: Yes,  pt was homeless prior to admission and will remain homeless although pt states that she has a friend she can stay with.  See below for further note on placement At discharge, do you have transportation home?:Yes,  provided pt with a bus pass Do you have the ability to pay for your medications:Yes,  access to meds  Release of information consent forms completed and in the chart;  Patient's signature needed at discharge.  Patient to Follow up at: Follow-up Information   Follow up with [facility] On 05/18/2013. (Walk in on this date for hospital discharge appointment.  Walk in clinic is Monday - Friday 8 am - 3 pm. )    Contact information:   [address], [place] [address] Phone: [phone] Fax: [phone]      Patient denies SI/HI:   Yes,  denies SI/HI    Safety Planning and Suicide Prevention discussed:  Yes,  discussed with pt.  Pt refused consent for CSW to contact family/friends.  See suicide prevention eeducation note.  CSW contacted [facility] to inquire about placement for pt.  Kim, administrator with [facility] states that pt left the facility in May and has not been back.  Maudie Mercury states that pt is able to come back anytime and is unaware of the 1st as being a time frame they gave her.  Pt believes she cannot go back to [facility] until the 1st.  Maudie Mercury requested an FL2 be faxed to her.   CSW spoke to pt about this conversation and pt states that she would still like to d/c today and work on getting back into [employer] from outside of the hospital.  CSW informed pt that CSW would start the process and that she should be in close contact with [place] [facility] to work on getting back in.    Ane Payment 05/17/2013, 11:55 AM

## 2013-05-22 NOTE — Progress Notes (Signed)
Patient Discharge Instructions:  After Visit Summary (AVS):   Faxed to:  05/22/13 Discharge Summary Note:   Faxed to:  05/22/13 Psychiatric Admission Assessment Note:   Faxed to:  05/22/13 Suicide Risk Assessment - Discharge Assessment:   Faxed to:  05/22/13  Fransico Setters, 05/22/2013, 3:29 PM  Faxed to [facility] at [phone]

## 2013-05-23 ENCOUNTER — Telehealth: Payer: Self-pay | Admitting: *Deleted

## 2013-05-23 NOTE — Telephone Encounter (Signed)
Received fax from [name], Chartered certified accountant at [facility]. Requesting current med list. No medications per chart; he was called andmessage left.  Letter to be scanned.

## 2013-05-25 IMAGING — CT CT ANGIO CHEST
1 of 2 series · 19 of 32 positions shown · IV contrast (APPLIED)
Comparison: Chest x-ray earlier today.

CLINICAL DATA: Chest pain.  History of vasculitis.  History of
cocaine use.

CT ANGIOGRAPHY CHEST WITH CONTRAST
TECHNIQUE: Multidetector CT imaging of the chest was performed
using the standard protocol during bolus administration of
intravenous contrast.  Multiplanar CT image reconstructions
including MIPs were obtained to evaluate the vascular anatomy.
Contrast:  80 ml Amnipaque-555.

[Series 7: thins for pacs · axial · 0.65mm/px · z∈[+1612,+1858]mm · 19 of 275 slices shown]
[im 14/275  lung]
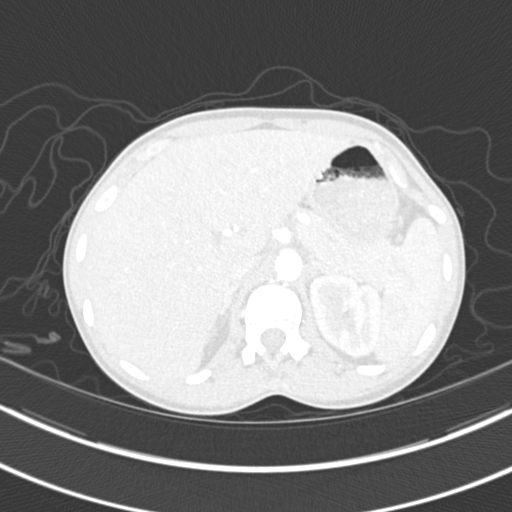
[im 28/275  mediastinal]
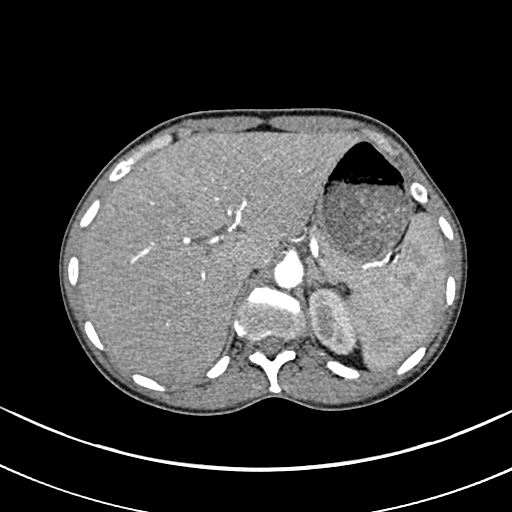
[im 42/275  lung]
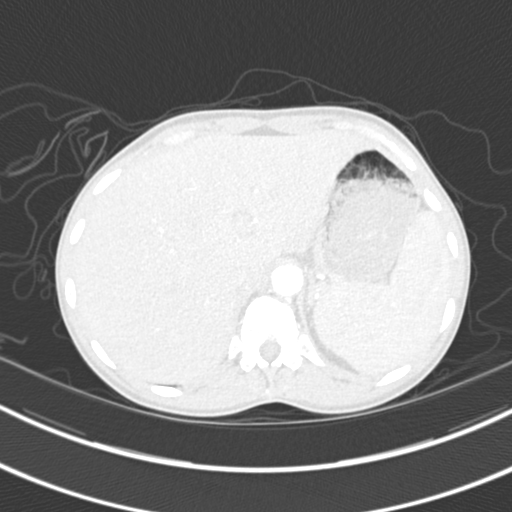
[im 69/275  mediastinal]
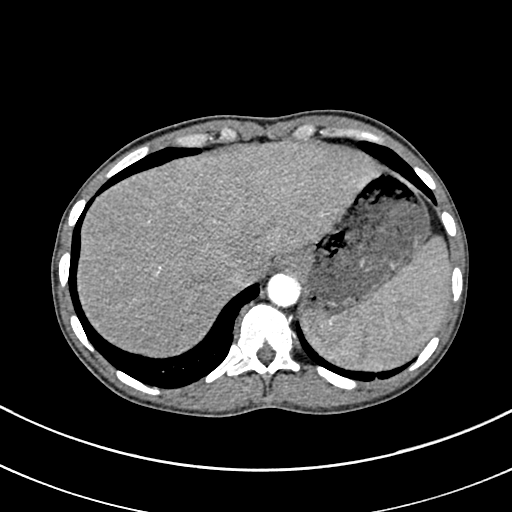
[im 83/275  lung]
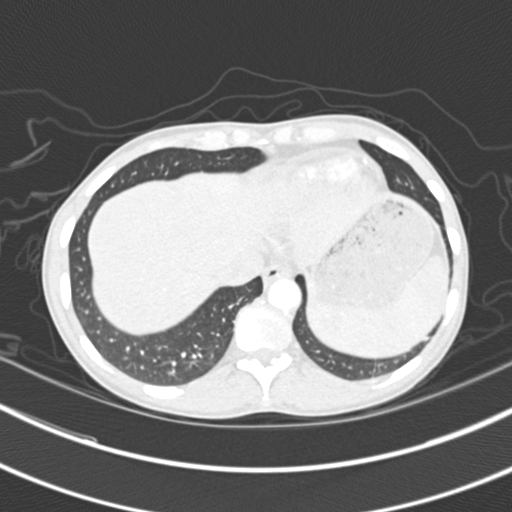
[im 92/275  mediastinal]
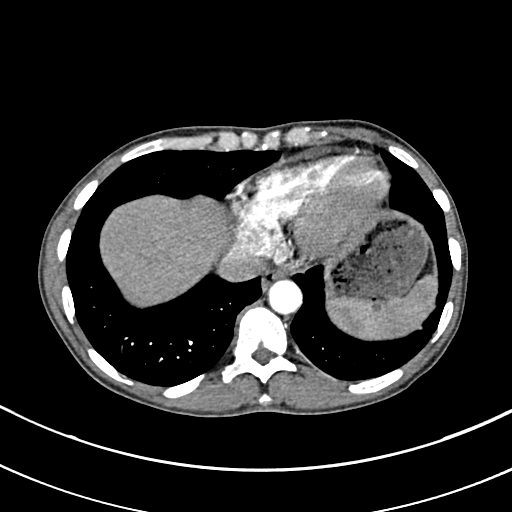
[im 96/275  lung]
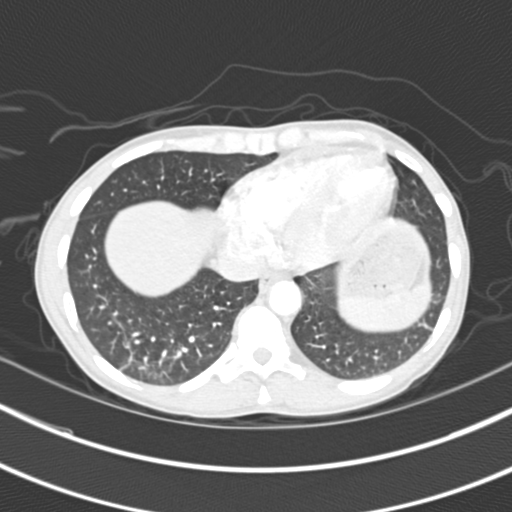
[im 110/275  mediastinal]
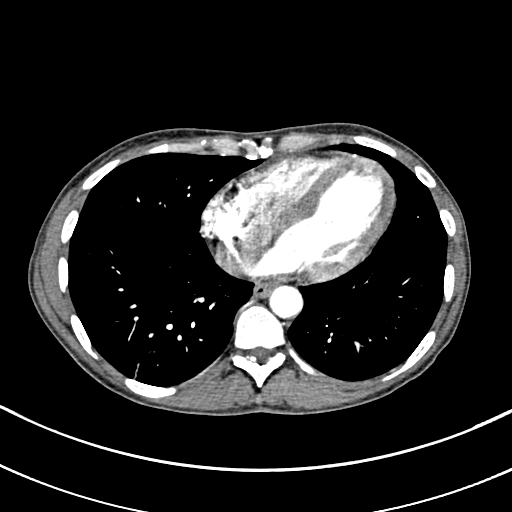
[im 124/275  lung]
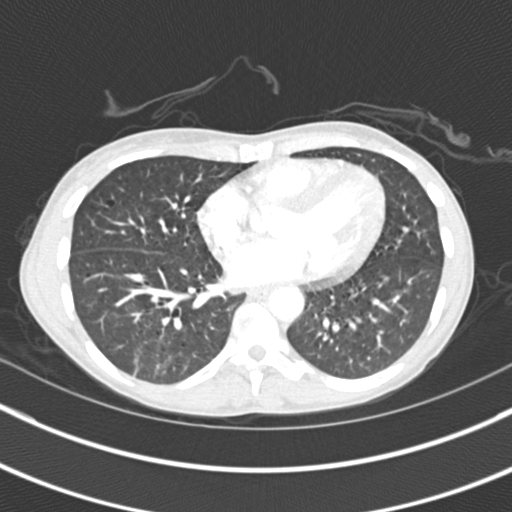
[im 138/275  mediastinal]
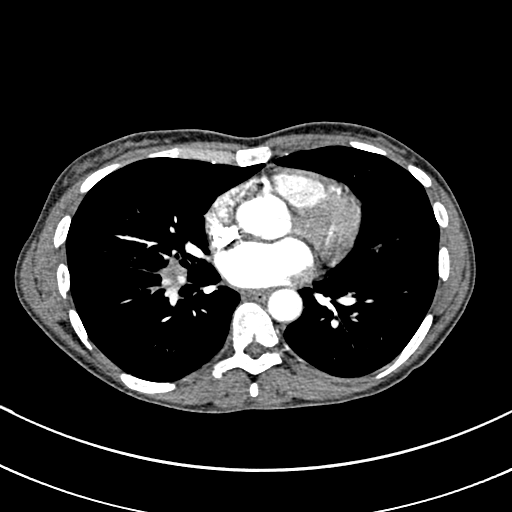
[im 151/275  lung]
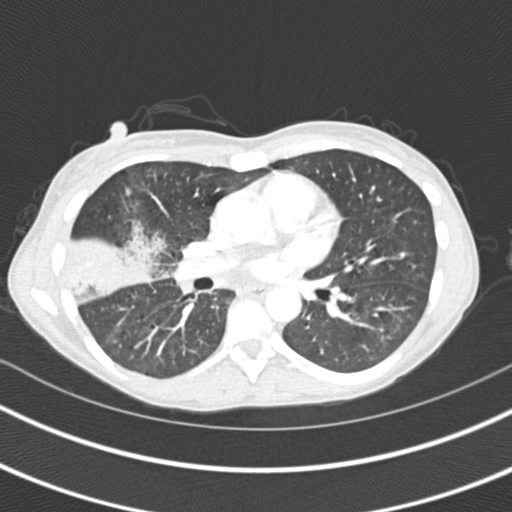
[im 165/275  mediastinal]
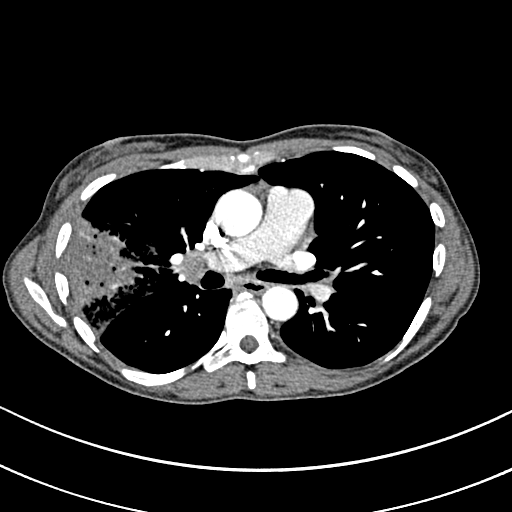
[im 179/275  lung]
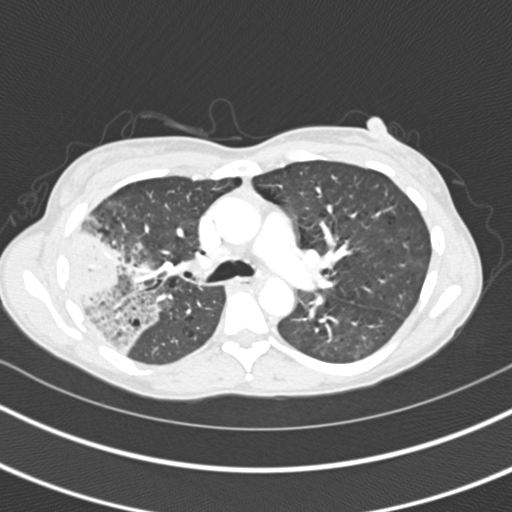
[im 183/275  mediastinal]
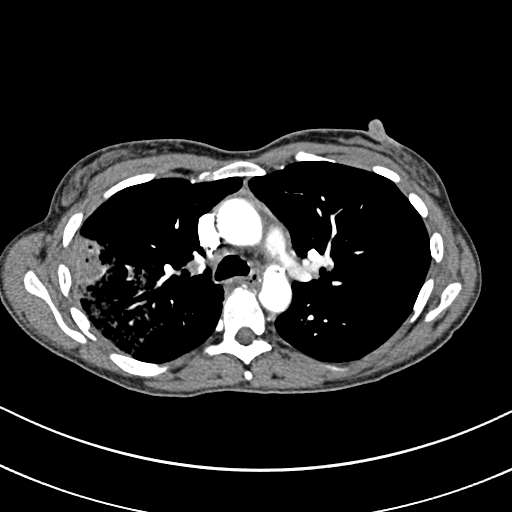
[im 192/275  lung]
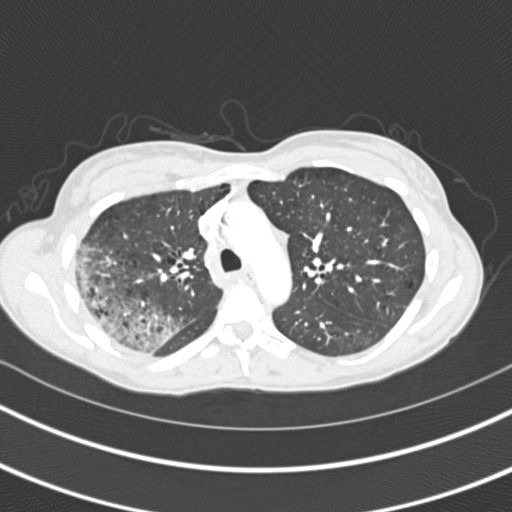
[im 206/275  mediastinal]
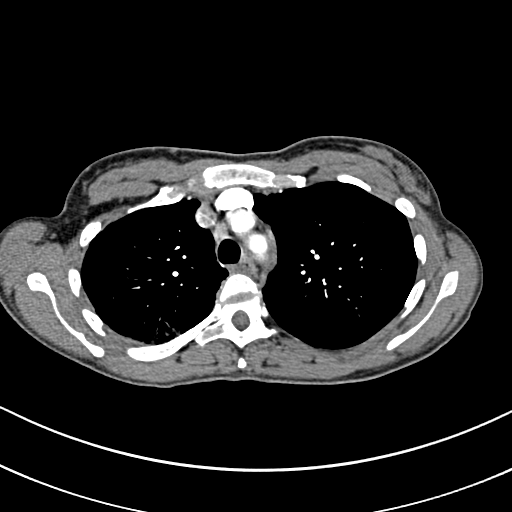
[im 233/275  lung]
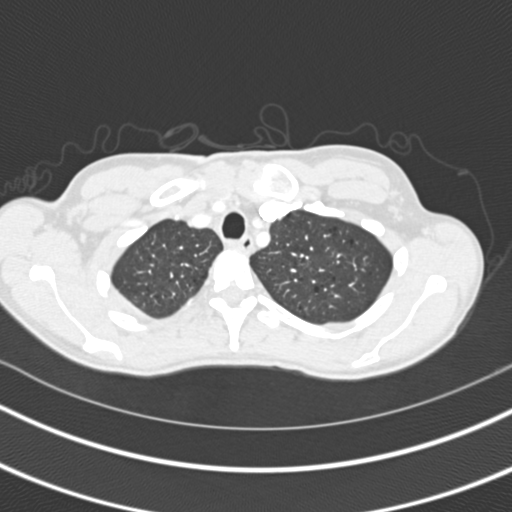
[im 247/275  mediastinal]
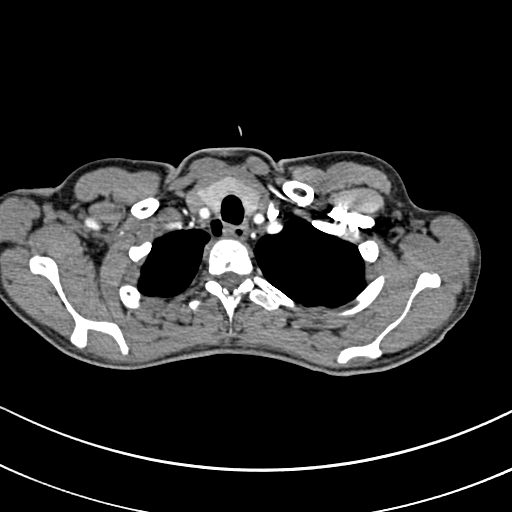
[im 261/275  lung]
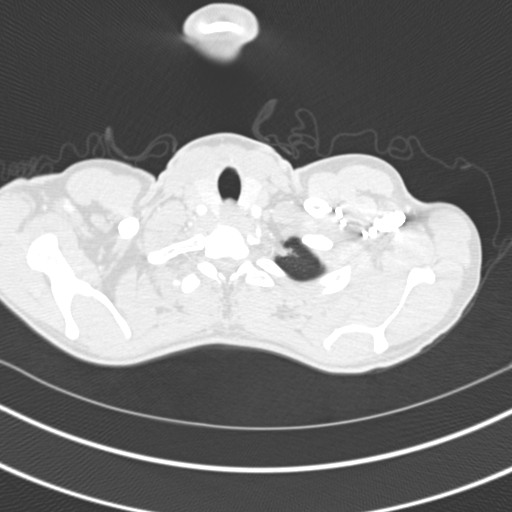

[19 of 32 positions shown; findings below may reference images not displayed]

FINDINGS: Dense consolidation right upper lobe with air
bronchograms consistent with pneumonia. Infiltrates involving the
superior segment right lower lobe and portions of the right middle
lobe are also present.

No pulmonary emboli.  Normal heart size.  Right hilar adenopathy is
likely reactive, with a 11 x 16 mm lymph node the superior right
hilum.  No mediastinal adenopathy or left hilar adenopathy.  Normal
osseous structures.  Upper abdominal organs unremarkable.  No
pleural effusion or pneumothorax.  Mild atheromatous change
transverse arch.

Review of the MIP images confirms the above findings.
IMPRESSION: No pulmonary emboli.  Findings consistent with right lung
pneumonia.  There is dense consolidation in the right upper lobe.

Follow-up chest x-rays until clear are necessary to exclude an
underlying obstructing lesion.

## 2013-06-03 ENCOUNTER — Encounter (HOSPITAL_COMMUNITY): Payer: Self-pay | Admitting: Emergency Medicine

## 2013-06-03 ENCOUNTER — Emergency Department (HOSPITAL_COMMUNITY)
Admission: EM | Admit: 2013-06-03 | Discharge: 2013-06-03 | Disposition: A | Discharge status: Home / Self Care | Payer: Medicaid Other | Attending: Emergency Medicine | Admitting: Emergency Medicine

## 2013-06-03 DIAGNOSIS — M25539 Pain in unspecified wrist: Secondary | ICD-10-CM | POA: Insufficient documentation

## 2013-06-03 DIAGNOSIS — F3289 Other specified depressive episodes: Secondary | ICD-10-CM | POA: Insufficient documentation

## 2013-06-03 DIAGNOSIS — Z5900 Homelessness unspecified: Secondary | ICD-10-CM | POA: Insufficient documentation

## 2013-06-03 DIAGNOSIS — M2550 Pain in unspecified joint: Secondary | ICD-10-CM

## 2013-06-03 DIAGNOSIS — Z8739 Personal history of other diseases of the musculoskeletal system and connective tissue: Secondary | ICD-10-CM | POA: Insufficient documentation

## 2013-06-03 DIAGNOSIS — F329 Major depressive disorder, single episode, unspecified: Secondary | ICD-10-CM | POA: Insufficient documentation

## 2013-06-03 DIAGNOSIS — Z862 Personal history of diseases of the blood and blood-forming organs and certain disorders involving the immune mechanism: Secondary | ICD-10-CM | POA: Insufficient documentation

## 2013-06-03 DIAGNOSIS — F17200 Nicotine dependence, unspecified, uncomplicated: Secondary | ICD-10-CM | POA: Insufficient documentation

## 2013-06-03 DIAGNOSIS — M069 Rheumatoid arthritis, unspecified: Secondary | ICD-10-CM | POA: Insufficient documentation

## 2013-06-03 DIAGNOSIS — Z79899 Other long term (current) drug therapy: Secondary | ICD-10-CM | POA: Insufficient documentation

## 2013-06-03 DIAGNOSIS — E119 Type 2 diabetes mellitus without complications: Secondary | ICD-10-CM | POA: Insufficient documentation

## 2013-06-03 DIAGNOSIS — Z8679 Personal history of other diseases of the circulatory system: Secondary | ICD-10-CM | POA: Insufficient documentation

## 2013-06-03 DIAGNOSIS — I10 Essential (primary) hypertension: Secondary | ICD-10-CM | POA: Insufficient documentation

## 2013-06-03 MED ORDER — IBUPROFEN 600 MG PO TABS: 600.0000 mg | ORAL_TABLET | Freq: Four times a day (QID) | ORAL | Status: DC | PRN

## 2013-06-03 MED ORDER — TRAMADOL HCL 50 MG PO TABS: 50.0000 mg | ORAL_TABLET | Freq: Four times a day (QID) | ORAL | Status: DC | PRN

## 2013-06-03 NOTE — ED Notes (Signed)
Per EMS- Pt complaining of wrist and leg pain. Pt is homeless and states she sleeps on concrete which is why her wrists and legs are sore.

## 2013-06-04 NOTE — ED Provider Notes (Signed)
CSN: [id number]     Arrival date & time 06/03/13  0443 History   First MD Initiated Contact with Patient 06/03/13 [phone]     Chief Complaint  Patient presents with  . Chest Pain  . Wrist Pain   (Consider location/radiation/quality/duration/timing/severity/associated sxs/prior Treatment) HPI Please note that this is a late entry. 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  In addition, she carries a diagnosis of diabetes and rheumatoid arthritis.  She is currently sleeping in a local carwash. Somewhat saw her there and called 911. Paramedics reported to the scene and the patient was awakened and then requested to be transported to the emergency department because she was having hand pain.  The patient reports diffuse hand pain in all joints. The pain radiates proximally. It is aching and moderately severe. Things seemed to worsen or exacerbate her pain or relieve it. She feels her joints are swollen. She denies any recent trauma. She denies paresthesias and motor weakness. She has not received medical care for reported arthritis. She feels her pain is consistent with previous exacerbations of rheumatoid arthritis  After extensive discussion with the patient, it appears that she has been call us as long as she can remember.  Past Medical History  Diagnosis Date  . 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 Followed by Dr. Louanne Skye  . Hypertension   . Cocaine abuse     ongoing with resultant vaculitis.  . Tobacco abuse   . Depression   . Normocytic anemia     BL Hgb 9.8-12. Last anemia panel 04/2010 - showing Fe 19, ferritin 101.  Pt on monthly B12 injections  . Rheumatoid arthritis(714.0)     patient reported  . Inflammatory arthritis    Past Surgical History  Procedure Laterality Date  . Skin biopsy      bilateral shin nodules on 04/2010  . Hernia repair     Family History  Problem Relation Age of Onset  .  Breast cancer Mother     Breast cancer  . Alcohol abuse Mother   . Colon cancer Maternal Aunt 32  . Alcohol abuse Father    History  Substance Use Topics  . Smoking status: Current Every Day Smoker -- 0.50 packs/day for 39 years    Types: Cigarettes  . Smokeless tobacco: Never Used     Comment: trying  . Alcohol Use: No   OB History   Grav Para Term Preterm Abortions TAB SAB Ect Mult Living                 Review of Systems Gen: The patient feels fatigued, she says she eats when she can. He is currently hungry Eyes: no discharge or drainage, no occular pain or visual changes Nose: no epistaxis or rhinorrhea Mouth: no dental pain, no sore throat Neck: no neck pain Lungs: no SOB, cough, wheezing CV: no chest pain, palpitations, dependent edema or orthopnea Abd: no abdominal pain, nausea, vomiting GU: no dysuria or gross hematuria MSK: As per history of present illness, otherwise negative Neuro: no headache, no focal neurologic deficits Skin: no rash Psyche: Denies current SI and HI, denies hallucinations  Allergies  Acetaminophen  Home Medications   Current Outpatient Rx  Name  Route  Sig  Dispense  Refill  . ibuprofen (ADVIL,MOTRIN) 600 MG tablet   Oral   Take 1 tablet (600 mg total) by mouth every 6 (six) hours as needed for  pain.   30 tablet   0   . traMADol (ULTRAM) 50 MG tablet   Oral   Take 1 tablet (50 mg total) by mouth every 6 (six) hours as needed for pain.   15 tablet   0    BP 128/74  Pulse 82  Temp(Src) 98.4 F (36.9 C) (Oral)  Resp 20  SpO2 99% Physical Exam Gen: well developed and appears very thin Head: NCAT Eyes: PERL, EOMI Nose: no epistaixis or rhinorrhea Mouth/throat: mucosa is moist and pink Neck: supple, no stridor Lungs: CTA B, no wheezing, rhonchi or rales CV: Regular rate and rhythm, no murmur Abd: soft, notender, nondistended Back: no ttp, no cva ttp Skin: no rashese, wnl Extremity: There are changes consistent with  rheumatoid arthritis in both hands and feet. Tenderness to palpation diffusely over the joints of both hands and all fingers. Normal cap refill, sensation intact light touch throughout, no signs of trauma. Neuro: CN ii-xii grossly intact, no focal deficits Psyche; odd affect,  calm and cooperative.  ED Course  Procedures (including critical care time)   MDM   1. Rheumatoid arthritis flare   2. Arthralgia    Plan is for symptomatic management. The patient is referred to the [facility] for outpatient f/u.    Elyn Peers, MD 06/04/13 [phone]

## 2013-06-13 ENCOUNTER — Observation Stay (HOSPITAL_COMMUNITY)
Admission: EM | Admit: 2013-06-13 | Discharge: 2013-06-14 | Disposition: A | Discharge status: Home / Self Care | Payer: Medicaid Other | Attending: Internal Medicine | Admitting: Internal Medicine

## 2013-06-13 ENCOUNTER — Encounter (HOSPITAL_COMMUNITY): Payer: Self-pay | Admitting: Emergency Medicine

## 2013-06-13 DIAGNOSIS — F1420 Cocaine dependence, uncomplicated: Secondary | ICD-10-CM | POA: Diagnosis present

## 2013-06-13 DIAGNOSIS — R82998 Other abnormal findings in urine: Secondary | ICD-10-CM | POA: Insufficient documentation

## 2013-06-13 DIAGNOSIS — F17200 Nicotine dependence, unspecified, uncomplicated: Secondary | ICD-10-CM | POA: Insufficient documentation

## 2013-06-13 DIAGNOSIS — F3289 Other specified depressive episodes: Secondary | ICD-10-CM | POA: Diagnosis present

## 2013-06-13 DIAGNOSIS — I10 Essential (primary) hypertension: Secondary | ICD-10-CM | POA: Diagnosis present

## 2013-06-13 DIAGNOSIS — T50995A Adverse effect of other drugs, medicaments and biological substances, initial encounter: Secondary | ICD-10-CM | POA: Insufficient documentation

## 2013-06-13 DIAGNOSIS — Z720 Tobacco use: Secondary | ICD-10-CM | POA: Diagnosis present

## 2013-06-13 DIAGNOSIS — L959 Vasculitis limited to the skin, unspecified: Secondary | ICD-10-CM

## 2013-06-13 DIAGNOSIS — I776 Arteritis, unspecified: Principal | ICD-10-CM | POA: Diagnosis present

## 2013-06-13 DIAGNOSIS — F329 Major depressive disorder, single episode, unspecified: Secondary | ICD-10-CM | POA: Insufficient documentation

## 2013-06-13 LAB — COMPREHENSIVE METABOLIC PANEL
BUN: 11 mg/dL (ref 6–23)
CO2: 27 mEq/L (ref 19–32)
Chloride: 98 mEq/L (ref 96–112)
Creatinine, Ser: 0.72 mg/dL (ref 0.50–1.10)
GFR calc Af Amer: >90 mL/min (ref >90)
GFR calc non Af Amer: >90 mL/min (ref >90)
Glucose, Bld: 90 mg/dL (ref 70–99)
Total Bilirubin: 0.7 mg/dL (ref 0.3–1.2)

## 2013-06-13 LAB — CBC WITH DIFFERENTIAL/PLATELET
HCT: 36 % (ref 36.0–46.0)
Hemoglobin: 12.6 g/dL (ref 12.0–15.0)
Lymphocytes Relative: 32 % (ref 12–46)
MCV: 84.5 fL (ref 78.0–100.0)
Monocytes Absolute: 0 10*3/uL — ABNORMAL LOW (ref 0.1–1.0)
Monocytes Relative: 1 % — ABNORMAL LOW (ref 3–12)
Neutro Abs: 2.5 10*3/uL (ref 1.7–7.7)
WBC: 3.7 10*3/uL — ABNORMAL LOW (ref 4.0–10.5)

## 2013-06-13 LAB — URINALYSIS, ROUTINE W REFLEX MICROSCOPIC
Bilirubin Urine: NEGATIVE
Ketones, ur: NEGATIVE mg/dL
Nitrite: NEGATIVE
Urobilinogen, UA: 2 mg/dL — ABNORMAL HIGH (ref 0.0–1.0)
pH: 7.5 (ref 5.0–8.0)

## 2013-06-13 LAB — RAPID URINE DRUG SCREEN, HOSP PERFORMED
Barbiturates: NOT DETECTED
Benzodiazepines: NOT DETECTED

## 2013-06-13 LAB — URINE MICROSCOPIC-ADD ON

## 2013-06-13 LAB — PROTIME-INR
INR: 1.09 (ref 0.00–1.49)
Prothrombin Time: 13.9 seconds (ref 11.6–15.2)

## 2013-06-13 MED ORDER — HYDROMORPHONE HCL PF 1 MG/ML IJ SOLN
1.0000 mg | INTRAMUSCULAR | Status: DC | PRN
  Administered 2013-06-14 (×3): 1 mg via INTRAVENOUS
  Filled 2013-06-13 (×3): qty 1

## 2013-06-13 MED ORDER — ENOXAPARIN SODIUM 30 MG/0.3ML ~~LOC~~ SOLN
30.0000 mg | Freq: Every day | SUBCUTANEOUS | Status: DC
  Administered 2013-06-14: 30 mg via SUBCUTANEOUS
  Filled 2013-06-13: qty 0.3

## 2013-06-13 MED ORDER — HYDROMORPHONE HCL PF 1 MG/ML IJ SOLN
1.0000 mg | Freq: Once | INTRAMUSCULAR | Status: AC
  Administered 2013-06-13: 1 mg via INTRAVENOUS
  Filled 2013-06-13: qty 1

## 2013-06-13 MED ORDER — ONDANSETRON HCL 4 MG/2ML IJ SOLN: 4.0000 mg | Freq: Three times a day (TID) | INTRAMUSCULAR | Status: DC | PRN

## 2013-06-13 MED ORDER — HYDROMORPHONE HCL PF 1 MG/ML IJ SOLN
1.0000 mg | INTRAMUSCULAR | Status: DC | PRN
  Administered 2013-06-13: 1 mg via INTRAVENOUS
  Filled 2013-06-13: qty 1

## 2013-06-13 MED ORDER — ONDANSETRON HCL 4 MG/2ML IJ SOLN
4.0000 mg | Freq: Once | INTRAMUSCULAR | Status: AC
  Administered 2013-06-13: 4 mg via INTRAVENOUS
  Filled 2013-06-13: qty 2

## 2013-06-13 NOTE — Progress Notes (Signed)
Pt arrived to 6N25 from [facility] ED via [facility].  Oriented pt to calll bell and room.  Paged MD for admitting orders.

## 2013-06-13 NOTE — ED Provider Notes (Signed)
CSN: [id number]     Arrival date & time 06/13/13  1553 History   First MD Initiated Contact with Patient 06/13/13 1613     Chief Complaint  Patient presents with  . Facial Swelling  . Facial Pain    HPI Patient awoke this morning with facial swelling, pain to face in both ears.  Patient also had spontaneous nosebleed.  Patient has previous history of levamisole vasculitis toxicity.  No fever chills.  Patient admits to using cocaine recently.  However she only used a nickel bag. Past Medical History  Diagnosis Date  . 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 Followed by Dr. Louanne Skye  . Hypertension   . Cocaine abuse     ongoing with resultant vaculitis.  . Tobacco abuse   . Depression   . Normocytic anemia     BL Hgb 9.8-12. Last anemia panel 04/2010 - showing Fe 19, ferritin 101.  Pt on monthly B12 injections  . Rheumatoid arthritis(714.0)     patient reported  . Inflammatory arthritis    Past Surgical History  Procedure Laterality Date  . Skin biopsy      bilateral shin nodules on 04/2010  . Hernia repair     Family History  Problem Relation Age of Onset  . Breast cancer Mother     Breast cancer  . Alcohol abuse Mother   . Colon cancer Maternal Aunt 44  . Alcohol abuse Father    History  Substance Use Topics  . Smoking status: Current Every Day Smoker -- 0.50 packs/day for 39 years    Types: Cigarettes  . Smokeless tobacco: Never Used     Comment: trying  . Alcohol Use: No   OB History   Grav Para Term Preterm Abortions TAB SAB Ect Mult Living                 Review of Systems All other systems reviewed and are negative Allergies  Acetaminophen  Home Medications   Current Outpatient Rx  Name  Route  Sig  Dispense  Refill  . aspirin EC 81 MG tablet   Oral   Take 273 mg by mouth once.         Marland Kitchen ibuprofen (ADVIL,MOTRIN) 600 MG tablet   Oral   Take 1 tablet (600 mg total) by mouth every 6 (six) hours as needed for pain.   30 tablet   0   . traMADol  (ULTRAM) 50 MG tablet   Oral   Take 1 tablet (50 mg total) by mouth every 6 (six) hours as needed for pain.   15 tablet   0    BP 203/92  Pulse 89  Temp(Src) 100.3 F (37.9 C) (Oral)  Resp 17  SpO2 100% Physical Exam  Nursing note and vitals reviewed. Constitutional: She is oriented to person, place, and time. She appears well-developed and well-nourished. No distress.  HENT:  Head: Normocephalic and atraumatic.  See pictures  Eyes: Pupils are equal, round, and reactive to light.  Neck: Normal range of motion.  Cardiovascular: Normal rate and intact distal pulses.   Pulmonary/Chest: No respiratory distress.  Abdominal: Normal appearance. She exhibits no distension.  Musculoskeletal: Normal range of motion.  Neurological: She is alert and oriented to person, place, and time. No cranial nerve deficit.  Skin: Skin is warm and dry. No rash noted.  See pictures  Psychiatric: She has a normal mood and affect. Her behavior is normal.  ED Course  Procedures (including critical care time) Labs Review Labs Reviewed  CBC WITH DIFFERENTIAL - Abnormal; Notable for the following:    WBC 3.7 (*)    Monocytes Relative 1 (*)    Monocytes Absolute 0.0 (*)    All other components within normal limits  COMPREHENSIVE METABOLIC PANEL - Abnormal; Notable for the following:    Sodium 134 (*)    All other components within normal limits  URINALYSIS, ROUTINE W REFLEX MICROSCOPIC - Abnormal; Notable for the following:    APPearance CLOUDY (*)    Hgb urine dipstick TRACE (*)    Urobilinogen, UA 2.0 (*)    Leukocytes, UA LARGE (*)    All other components within normal limits  URINE RAPID DRUG SCREEN (HOSP PERFORMED) - Abnormal; Notable for the following:    Cocaine POSITIVE (*)    All other components within normal limits  URINE MICROSCOPIC-ADD ON - Abnormal; Notable for the following:    Bacteria, UA FEW (*)    All other components within normal limits  URINE CULTURE    PROTIME-INR   Imaging Review No results found.  MDM   1. Vasculitis of skin   2. Cocaine dependence    Discussed with internal medicine who will admit patient at [facility].    Dot Lanes, MD 06/13/13 [phone]

## 2013-06-13 NOTE — ED Notes (Signed)
[facility] called.

## 2013-06-13 NOTE — Progress Notes (Signed)
Paged MD to let him know that patient had arrived in 6N[facility] from [facility] ED for admission.

## 2013-06-13 NOTE — ED Notes (Signed)
Per EMS-pt c/o of facial swelling and pain that started 2 days ago.  Pt states same has happened before was placed on prednisone and antibiotics. Pain 10/10.

## 2013-06-13 NOTE — ED Notes (Signed)
Report called to nurse 5N

## 2013-06-14 DIAGNOSIS — L988 Other specified disorders of the skin and subcutaneous tissue: Secondary | ICD-10-CM

## 2013-06-14 DIAGNOSIS — F1420 Cocaine dependence, uncomplicated: Secondary | ICD-10-CM

## 2013-06-14 LAB — BASIC METABOLIC PANEL
BUN: 8 mg/dL (ref 6–23)
CO2: 27 mEq/L (ref 19–32)
Calcium: 9.2 mg/dL (ref 8.4–10.5)
GFR calc non Af Amer: >90 mL/min (ref >90)
Glucose, Bld: 97 mg/dL (ref 70–99)

## 2013-06-14 LAB — CBC WITH DIFFERENTIAL/PLATELET
Basophils Relative: 0 % (ref 0–1)
Eosinophils Absolute: 0.1 10*3/uL (ref 0.0–0.7)
Eosinophils Relative: 2 % (ref 0–5)
MCH: 29.8 pg (ref 26.0–34.0)
MCHC: 35 g/dL (ref 30.0–36.0)
MCV: 85.2 fL (ref 78.0–100.0)
Monocytes Relative: 2 % — ABNORMAL LOW (ref 3–12)
Neutrophils Relative %: 56 % (ref 43–77)
Platelets: 268 10*3/uL (ref 150–400)

## 2013-06-14 LAB — URINE CULTURE

## 2013-06-14 MED ORDER — HYDROMORPHONE HCL PF 1 MG/ML IJ SOLN
0.5000 mg | Freq: Once | INTRAMUSCULAR | Status: AC
  Administered 2013-06-14: 0.5 mg via INTRAVENOUS
  Filled 2013-06-14: qty 1

## 2013-06-14 MED ORDER — TRAMADOL HCL 50 MG PO TABS: 50.0000 mg | ORAL_TABLET | Freq: Four times a day (QID) | ORAL | Status: DC | PRN

## 2013-06-14 NOTE — Progress Notes (Signed)
Subjective: Patient reports having pain to the tip of her nose, bilateral ears, and bilateral cheeks. No changes from yesterday. Patient has no other complaints. She does report that she may be interested in trying to stop using cocaine.   Objective: Vital signs in last 24 hours: Filed Vitals:   06/13/13 1817 06/13/13 2030 06/14/13 0500 06/14/13 1100  BP: 173/93 131/69 106/62   Pulse: 98 86 64   Temp: 100.2 F (37.9 C) 98.1 F (36.7 C)    TempSrc: Oral Oral    Resp: 18 17 18    Height:    5' (1.524 m)  Weight:    101 lb (45.813 kg)  SpO2: 100% 99% 100%    Weight change:   Intake/Output Summary (Last 24 hours) at 06/14/13 1400 Last data filed at 06/14/13 0900  Gross per 24 hour  Intake    240 ml  Output      0 ml  Net    240 ml   Physical Exam General: alert, cooperative, and in no apparent distress HEENT: pupils equal round and reactive to light, vision grossly intact, oropharynx clear and non-erythematous  Neck: supple Lungs: clear to ascultation bilaterally, normal work of respiration, no wheezes, rales, ronchi Heart: regular rate and rhythm, no murmurs, gallops, or rubs Abdomen: soft, non-tender, non-distended, normal bowel sounds Extremities: warm extremities, no rash Skin: exquisitely tender dusky patches with surrounding ring of erythema to bilateral cheeks, tip of nose; dusky discoloration with multiple blisters and bullae w/ edema to bilateral ears, also very tender Neurologic: alert & oriented X3  Lab Results: Basic Metabolic Panel:  Recent Labs Lab 06/13/13 1631 06/14/13 0500  NA 134* 133*  K 3.9 3.5  CL 98 98  CO2 27 27  GLUCOSE 90 97  BUN 11 8  CREATININE 0.72 0.71  CALCIUM 9.5 9.2   Liver Function Tests:  Recent Labs Lab 06/13/13 1631  AST 21  ALT 8  ALKPHOS 75  BILITOT 0.7  PROT 7.6  ALBUMIN 3.6   CBC:  Recent Labs Lab 06/13/13 1631 06/14/13 0500  WBC 3.7* 3.1*  NEUTROABS 2.5 1.7  HGB 12.6 11.7*  HCT 36.0 33.4*  MCV 84.5  85.2  PLT 283 268   Coagulation:  Recent Labs Lab 06/13/13 1631  LABPROT 13.9  INR 1.09   Urine Drug Screen: Drugs of Abuse     Component Value Date/Time   LABOPIA NONE DETECTED 06/13/2013 1637   LABOPIA PPS 08/31/2012 1045   COCAINSCRNUR POSITIVE* 06/13/2013 1637   COCAINSCRNUR NEG 08/31/2012 1045   LABBENZ NONE DETECTED 06/13/2013 1637   LABBENZ NEG 08/31/2012 1045   LABBENZ NEGATIVE 06/12/2011 0221   AMPHETMU NONE DETECTED 06/13/2013 1637   AMPHETMU NEGATIVE 06/12/2011 0221   THCU NONE DETECTED 06/13/2013 1637   THCU 63.4* 05/20/2012 1405   LABBARB NONE DETECTED 06/13/2013 1637   LABBARB NEG 08/31/2012 1045    Urinalysis:  Recent Labs Lab 06/13/13 1637  COLORURINE YELLOW  LABSPEC 1.017  PHURINE 7.5  GLUCOSEU NEGATIVE  HGBUR TRACE*  BILIRUBINUR NEGATIVE  KETONESUR NEGATIVE  PROTEINUR NEGATIVE  UROBILINOGEN 2.0*  NITRITE NEGATIVE  LEUKOCYTESUR LARGE*   Medications: I have reviewed the patient's current medications.  Scheduled Meds: . enoxaparin (LOVENOX) injection  30 mg Subcutaneous Daily   Continuous Infusions:  PRN Meds:.HYDROmorphone (DILAUDID) injection  Assessment/Plan:  # Levamisole induced vasculitis  The patients rash is likely 2/2 levamisole vasculitis given her current presentation and her past similar presentation after cocaine use. Will treat pain symptomatically. There  are no signs of infection currently. - 1 mg hydromorphone q 4 prn - patient takes tramadol at home and I will discharge her with a refill of this medication; I do not feel comfortable sending her home with any narcotics given her history of substance abuse -UDS + cocaine  # Depression  - Not currently medicated. Appears to be stable, though may benefit from outpatient psych follow up. She also would benefit from cocaine cessation.  # HTN  - Not currently treated and BP 106/62 here.   # Cocaine Dependence  - CSW saw patient and gave her options for outpatient options for substance abuse  resources. There is no clinical need for inpatient services at this time. Patient does voice some interest in quitting and she has quit before, but with frequent relapses.   # Pyuria- few bacteria present, otherwise negative UA - No signs symptoms of UTI, will not treat - if symptomatic, recheck as outpatient  DVT: Lovenox  Dispo: Discharge today.  The patient does have a current PCP (Charlann Lange, MD) and does not need an [facility] hospital follow-up appointment after discharge.  The patient does not have transportation limitations that hinder transportation to clinic appointments.  .Services Needed at time of discharge: Y = Yes, Blank = No PT:   OT:   RN:   Equipment:   Other:     LOS: 1 day   Rebecca Eaton, MD 06/14/2013, 2:00 PM

## 2013-06-14 NOTE — Consult Note (Addendum)
WOC consult Note Reason for Consult:levamisole vasculitis. She reports she has had lesions similar in the past on her face but they have never been this bad. She reports last using cocaine on Sunday or Monday of this week and only had a small amount. She smokes crack, she does not inject.  She has lived on the streets most recently and has a friend she can stay with some but not full time.  She has a [employer] forces operational officer.  Wound type: levamisole vasculitis on the bilateral ears, left nare tip, bilateral cheeks.  I have assessed the remainder of the body and not found any other active lesions.  Measurement: outer aspect of the bilateral ears, not really measurable.  Cheek lesions 5cm x 3cm x 0. Nare 1.0cm x 1.0cm  Wound bed: all areas are maroon with some intact bulla on the ears. Appear to be full thickness at all sites except nare. Drainage (amount, consistency, odor)  None noted Periwound: periorbital edema and erythema. The areas are very tender and painful  Dressing procedure/placement/frequency: silver antimicrobial gel to be applied daily in a thin layer and left open to air.  Pt applied herself today, WOC provided patient with handheld mirror.  Explained to patient these areas my progress and being to slough and drain if so she will need to see a medical provider for treatment.   Discussed current living situation, she will consider return to [facility] if CSW is able to arrange. Discussed POC with patient and bedside nurse.  Re consult if needed, will not follow at this time. Thanks  Keyna Blizard [place], [place] [phone])

## 2013-06-14 NOTE — H&P (Signed)
INTERNAL MEDICINE TEACHING SERVICE Attending Admission Note  Date: 06/14/2013  Patient name: [name]  Medical record number: [id number]  Date of birth: October 09, 1960    I have seen and evaluated [name] and discussed their care with the Residency Team.  52 yr old AAF w/ hx cocaine induced vasculitis, HTN, depression, normocytic anemia, previous admissions for levimasole toxicity, presented due to a rash. She admits to use of crack cocaine 1-2 days ago.  She had appearance of a painful rash on cheeks, tip of nose, and bilateral ears.  She denies fever, chills, SOB, cough.   She has evidence of levamisole induced vasculitis. Wound care needed.  She has some erythema and edema of bilateral ears, but this should improve over time. No definite indication for steroids. Will need to watch her for progression and recurrence.  Monitor for signs of infection. Most important is for her to stop using cocaine of any form. She carries a dx of depression and was tearful. No indication of homicidal or suicidal behaviors.  Needs SW input for resources. She really needs psychiatry as outpatient to help her with her substance abuse and depression (dual diagnosis patient?).  Dominic Pea, DO, [facility] 06/14/2013, 4:11 PM

## 2013-06-14 NOTE — H&P (Signed)
Date: 06/14/2013               Patient Name:  [name] MRN: [id number]  DOB: 1960-10-20 Age / Sex: 52 y.o., female   PCP: Charlann Lange, MD         Medical Service: Internal Medicine Teaching Service         Attending Physician: Dr. Dominic Pea, DO    First Contact: Dr. Rebecca Eaton, MD Pager: [phone]  Second Contact: Dr. Clayburn Pert, MD Pager: [phone]       After Hours (After 5p/  First Contact Pager: [phone]  weekends / holidays): Second Contact Pager: [phone]   Chief Complaint: Rash  History of Present Illness: [name] is a 52 y.o. woman with a pmhx of cocaine induced vasculitis who present for new onset rash. She was in her normal state of health when she smoked cocaine. About 28 hours later she noticed a painful rash develop on her cheeks and ear lobes. The rash is dark purpuric with a 2 mm border of bright erythema. There is a bullae on the patients left ear. She states that this rash is similar to a rash she has had twice before while using cocaine.  Denis SOB, CP, HA, Abdominal pain, F/C, N/V, C/D.  Meds: Current Facility-Administered Medications  Medication Dose Route Frequency Provider Last Rate Last Dose  . enoxaparin (LOVENOX) injection 30 mg  30 mg Subcutaneous Daily Neema K Sharda, MD      . HYDROmorphone (DILAUDID) injection 1 mg  1 mg Intravenous Q4H PRN Othella Boyer, MD   1 mg at 06/14/13 0113    Allergies: Allergies as of 06/13/2013 - Review Complete 06/13/2013  Allergen Reaction Noted  . Acetaminophen Swelling    Past Medical History  Diagnosis Date  . 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 Followed by Dr. Louanne Skye  . Hypertension   . Cocaine abuse     ongoing with resultant vaculitis.  . Tobacco abuse   . Depression   . Normocytic anemia     BL Hgb 9.8-12. Last anemia panel 04/2010 - showing Fe 19, ferritin 101.  Pt on monthly B12 injections  . Rheumatoid arthritis(714.0)     patient reported  . Inflammatory arthritis    Past  Surgical History  Procedure Laterality Date  . Skin biopsy      bilateral shin nodules on 04/2010  . Hernia repair     Family History  Problem Relation Age of Onset  . Breast cancer Mother     Breast cancer  . Alcohol abuse Mother   . Colon cancer Maternal Aunt 38  . Alcohol abuse Father    History   Social History  . Marital Status: Single    Spouse Name: N/A    Number of Children: 0  . Years of Education: 11th grade   Occupational History  . Disability     since 2011, due to her rheumatoid arthritis   Social History Main Topics  . Smoking status: Current Every Day Smoker -- 0.50 packs/day for 39 years    Types: Cigarettes  . Smokeless tobacco: Never Used     Comment: trying  . Alcohol Use: No  . Drug Use: Yes    Special: "Crack" cocaine, Cocaine, Marijuana     Comment: smokes cocaine  . Sexual Activity: No   Other Topics Concern  . Not on file   Social History Narrative   Unemployed:  cleaning in past  Living at [place]; has Medicaid.   crack/cocaine use; pt denies IVDU   tobacco:  1/2 ppd, trying to quit   alcohol:  none        Review of Systems: Pertinent items are noted in HPI.  Physical Exam: Blood pressure 131/69, pulse 86, temperature 98.1 F (36.7 C), temperature source Oral, resp. rate 17, SpO2 99.00%. Physical Exam  Constitutional: She is oriented to person, place, and time. She appears well-developed and well-nourished. No distress.  HENT:  Mouth/Throat: Oropharynx is clear and moist. No oropharyngeal exudate.  Eyes: EOM are normal. Pupils are equal, round, and reactive to light.  Cardiovascular: Normal rate, regular rhythm, normal heart sounds and intact distal pulses.  Exam reveals no gallop and no friction rub.   No murmur heard. Pulmonary/Chest: Effort normal and breath sounds normal. No respiratory distress. She has no wheezes. She has no rales.  Abdominal: Soft. Bowel sounds are normal. She exhibits no distension. There is no  tenderness.  Musculoskeletal: She exhibits no edema and no tenderness.  Neurological: She is alert and oriented to person, place, and time.  Skin: She is not diaphoretic.     The rash is dark purpuric with a 2 mm border of bright erythema.   Psychiatric:  Tearful for her decisions     Lab results: Basic Metabolic Panel:  Recent Labs  06/13/13 1631  NA 134*  K 3.9  CL 98  CO2 27  GLUCOSE 90  BUN 11  CREATININE 0.72  CALCIUM 9.5   Liver Function Tests:  Recent Labs  06/13/13 1631  AST 21  ALT 8  ALKPHOS 75  BILITOT 0.7  PROT 7.6  ALBUMIN 3.6   CBC:  Recent Labs  06/13/13 1631  WBC 3.7*  NEUTROABS 2.5  HGB 12.6  HCT 36.0  MCV 84.5  PLT 283   Coagulation:  Recent Labs  06/13/13 1631  LABPROT 13.9  INR 1.09   Urine Drug Screen: Drugs of Abuse     Component Value Date/Time   LABOPIA NONE DETECTED 06/13/2013 1637   LABOPIA PPS 08/31/2012 1045   COCAINSCRNUR POSITIVE* 06/13/2013 1637   COCAINSCRNUR NEG 08/31/2012 1045   LABBENZ NONE DETECTED 06/13/2013 1637   LABBENZ NEG 08/31/2012 1045   LABBENZ NEGATIVE 06/12/2011 0221   AMPHETMU NONE DETECTED 06/13/2013 1637   AMPHETMU NEGATIVE 06/12/2011 0221   THCU NONE DETECTED 06/13/2013 1637   THCU 63.4* 05/20/2012 1405   LABBARB NONE DETECTED 06/13/2013 1637   LABBARB NEG 08/31/2012 1045   Urinalysis:  Recent Labs  06/13/13 [place]  LABSPEC 1.017  PHURINE 7.5  GLUCOSEU NEGATIVE  HGBUR TRACE*  BILIRUBINUR NEGATIVE  KETONESUR NEGATIVE  PROTEINUR NEGATIVE  UROBILINOGEN 2.0*  NITRITE NEGATIVE  LEUKOCYTESUR LARGE*    Assessment & Plan by Problem: Principal Problem:   VASCULITIS Active Problems:   DEPRESSION, SEVERE   ESSENTIAL HYPERTENSION   Tobacco abuse   Cocaine dependence  # Levamisole induced vasculitis The patients rash is likely 2/2 levamisole vasculitis. There is no conclusive evidence to support the use of steroids in these cases. Will treat pain symptomatically. May consider  antibiotics if signs symptoms of cellulitis, No Signs of infection at initial presentation.  - 1 mg hydromorphone q 4 prn  # Depression - Not of medicines, refer to [facility] as outpatient  # HTN - Not on meds  # Tobacco Abuse - Consider nicoderm if requested by patient  # Cocaine Dependence - Refer SW in AM  # Hematuria -  No signs symptoms of UTI - Recheck as outpatient and consider w/u as indicated as outpatient.  DVT: Lovenox  Dispo: Disposition is deferred at this time, awaiting improvement of current medical problems. Anticipated discharge in approximately 1-2 day(s).   The patient does have a current PCP (Charlann Lange, MD) and does need an [facility] hospital follow-up appointment after discharge.  The patient does not know have transportation limitations that hinder transportation to clinic appointments.  Signed: Marrion Coy, MD 06/14/2013, 1:30 AM

## 2013-06-14 NOTE — Clinical Social Work Note (Signed)
Clinical Social Work Department BRIEF PSYCHOSOCIAL ASSESSMENT 06/14/2013  Patient:  [name], [name]     Account Number:  [id number]     [place] date:  06/13/2013  Clinical Social Worker:  Otilio Saber  Date/Time:  06/14/2013 03:09 PM  Referred by:  Physician  Date Referred:  06/14/2013 Referred for  Other - See comment   Other Referral:   Current substance abuse and homelessness   Interview type:  Patient Other interview type:    PSYCHOSOCIAL DATA Living Status:  FRIEND(S) Admitted from facility:   Level of care:   Primary support name:  none reported Primary support relationship to patient:  NONE Degree of support available:   Patient could not give a particular name, but stated she has friends who are supportive    CURRENT CONCERNS Current Concerns  Other - See comment   Other Concerns:   Resources for substance abuse counseling    SOCIAL WORK ASSESSMENT / PLAN Clinical Social Worker received referral for patient needing outpatient resources for substance abuse. CSW introduced self and explained reaosn for visit.    Patient reported that she was interested in receiving substance abuse programs and that her current plan from the hospital is to stay with a friend. Patient expressed interest in returning back to [facility] at some point. Per patient, the facility stated that she could come back at any time. CSW informed patient that she will need to go through her PCP for [facility] when she decides to contact [facility] again. Patient voiced understanding. CSW inquired about interest in any counseling resources and patient was agreeable. CSW provided psychiatric and mental health counseling and supportive and community resources.    CSW will sign off, as social work intervention is no longer needed. As CSW was leaving, representatives from [employer] for [facility] were coming in to speak with patient.   Assessment/plan status:  Referral to [employer] Other  assessment/ plan:   Information/referral to community resources:   Substance Abuse treatment facilities  Outpatient mental health and psychiatric counseling  Community and supportive resources    PATIENT'S/FAMILY'S RESPONSE TO PLAN OF CARE: Patient reported that she is agreeable to receiving resources for substance abuse and mental health and psychiatric counseling. Patient was appreciative of CSW's visit and resources provided.

## 2013-06-14 NOTE — Progress Notes (Signed)
Pt discharged to home

## 2013-06-14 NOTE — Discharge Summary (Signed)
Name: [name] MRN: [id number] DOB: 1961/07/12 52 y.o. 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     ibuprofen 600 MG tablet  Commonly known as:  ADVIL,MOTRIN  Take 1 tablet (600 mg total) by mouth every 6 (six) hours as needed for pain.     traMADol 50 MG tablet  Commonly known as:  ULTRAM  Take 1 tablet (50 mg total) by mouth every 6 (six) hours as needed for pain.       Disposition and follow-up:   [name] was discharged from [facility] in Stable condition.  At the hospital follow up visit please address:  1.  Pyuria- Patient w/ pyuria and few bacteria in urine, but asymptomatic. Please assess for symptoms. 2.  Substance abuse- Patient expressed some interest in quitting cocaine use and was given resources for substance abuse treatment by CSW.  2.  Labs / imaging needed at time of follow-up: consider repeat UA if patient is symptomatic  3.  Pending labs/ test needing follow-up: UCx  Follow-up Appointments:     Follow-up Information   Follow up with Lucious Groves, DO On 06/21/2013. (2:45pm)    Specialty:  Internal Medicine   Contact information:   [place] [address] [phone]       Discharge Instructions:  Future Appointments Provider Department Dept Phone   06/21/2013 2:45 PM Joni Reining, DO [place] [phone]     Consultations:  none  Admission HPI:  [name] is a 52 y.o. woman with a pmhx of cocaine induced vasculitis who present for new onset rash. She was in her normal state of health when she smoked  cocaine 3 days ago. About 28 hours later she noticed a painful rash develop on her cheeks and ear lobes. The rash is dark purpuric with a 2 mm border of bright erythema. There is a bullae on the patients left ear. She states that this rash is similar to a rash she has had twice before while using cocaine. Denis SOB, CP, HA, Abdominal pain, F/C, N/V, C/D.  Hospital Course by problem list:   # Levamisole induced vasculitis- The patients rash is likely 2/2 levamisole vasculitis given her current presentation and her past similar presentation after cocaine use. While admitted, we treated her pain with dilaudid IV, which controlled her pain well. There were no signs of infection during this admission, so no antibiotics were given. I did not feel comfortable sending her home with any narcotics given her history of substance abuse. Patient takes tramadol at home and I discharged her with a refill of this medication. UDS + cocaine this admission.  # Depression- Not currently medicated. Appears to be stable, though may benefit from outpatient psych follow up given she became tearful when discussing her past attempts to stop using cocaine. Her mood would likely benefit from cocaine cessation.   # HTN- Not currently treated at home and BP remained stable while she was in house. Last BP measured was 106/62 here.   # Cocaine  Dependence- CSW saw patient and gave her options for outpatient options for substance abuse treatment. There is no clinical need for inpatient services at this time. Patient does voice some interest in quitting and she has quit before, but with frequent relapses.   # Pyuria- Upon admission, pt had pyuria and few bacteria present on UA. Otherwise negative UA. She remained asymptomatic, so we did not treat for UTI. If symptomatic, recheck UA as outpatient.  Discharge Vitals:   BP 134/68  Pulse 84  Temp(Src) 98.4 F (36.9 C) (Oral)  Resp 18  Ht 5' (1.524 m)  Wt 101 lb (45.813 kg)  BMI  19.73 kg/m2  SpO2 100%  Discharge Labs:  Results for orders placed during the hospital encounter of 06/13/13 (from the past 24 hour(s))  CBC WITH DIFFERENTIAL     Status: Abnormal   Collection Time    06/13/13  4:31 PM      Result Value Range   WBC 3.7 (*) 4.0 - 10.5 K/uL   RBC 4.26  3.87 - 5.11 MIL/uL   Hemoglobin 12.6  12.0 - 15.0 g/dL   HCT 36.0  36.0 - 46.0 %   MCV 84.5  78.0 - 100.0 fL   MCH 29.6  26.0 - 34.0 pg   MCHC 35.0  30.0 - 36.0 g/dL   RDW 13.5  11.5 - 15.5 %   Platelets 283  150 - 400 K/uL   Neutrophils Relative % 66  43 - 77 %   Neutro Abs 2.5  1.7 - 7.7 K/uL   Lymphocytes Relative 32  12 - 46 %   Lymphs Abs 1.2  0.7 - 4.0 K/uL   Monocytes Relative 1 (*) 3 - 12 %   Monocytes Absolute 0.0 (*) 0.1 - 1.0 K/uL   Eosinophils Relative 1  0 - 5 %   Eosinophils Absolute 0.0  0.0 - 0.7 K/uL   Basophils Relative 0  0 - 1 %   Basophils Absolute 0.0  0.0 - 0.1 K/uL  COMPREHENSIVE METABOLIC PANEL     Status: Abnormal   Collection Time    06/13/13  4:31 PM      Result Value Range   Sodium 134 (*) 135 - 145 mEq/L   Potassium 3.9  3.5 - 5.1 mEq/L   Chloride 98  96 - 112 mEq/L   CO2 27  19 - 32 mEq/L   Glucose, Bld 90  70 - 99 mg/dL   BUN 11  6 - 23 mg/dL   Creatinine, Ser 0.72  0.50 - 1.10 mg/dL   Calcium 9.5  8.4 - 10.5 mg/dL   Total Protein 7.6  6.0 - 8.3 g/dL   Albumin 3.6  3.5 - 5.2 g/dL   AST 21  0 - 37 U/L   ALT 8  0 - 35 U/L   Alkaline Phosphatase 75  39 - 117 U/L   Total Bilirubin 0.7  0.3 - 1.2 mg/dL   GFR calc non Af Amer >90  >90 mL/min   GFR calc Af Amer >90  >90 mL/min  PROTIME-INR     Status: None   Collection Time    06/13/13  4:31 PM      Result Value Range   Prothrombin Time 13.9  11.6 - 15.2 seconds   INR 1.09  0.00 - 1.49  URINALYSIS, ROUTINE W REFLEX MICROSCOPIC     Status: Abnormal   Collection Time    06/13/13  4:37 PM      Result  Value Range   Color, Urine YELLOW  YELLOW   APPearance CLOUDY (*) CLEAR   Specific Gravity, Urine 1.017   1.005 - 1.030   pH 7.5  5.0 - 8.0   Glucose, UA NEGATIVE  NEGATIVE mg/dL   Hgb urine dipstick TRACE (*) NEGATIVE   Bilirubin Urine NEGATIVE  NEGATIVE   Ketones, ur NEGATIVE  NEGATIVE mg/dL   Protein, ur NEGATIVE  NEGATIVE mg/dL   Urobilinogen, UA 2.0 (*) 0.0 - 1.0 mg/dL   Nitrite NEGATIVE  NEGATIVE   Leukocytes, UA LARGE (*) NEGATIVE  URINE RAPID DRUG SCREEN (HOSP PERFORMED)     Status: Abnormal   Collection Time    06/13/13  4:37 PM      Result Value Range   Opiates NONE DETECTED  NONE DETECTED   Cocaine POSITIVE (*) NONE DETECTED   Benzodiazepines NONE DETECTED  NONE DETECTED   Amphetamines NONE DETECTED  NONE DETECTED   Tetrahydrocannabinol NONE DETECTED  NONE DETECTED   Barbiturates NONE DETECTED  NONE DETECTED  URINE MICROSCOPIC-ADD ON     Status: Abnormal   Collection Time    06/13/13  4:37 PM      Result Value Range   Squamous Epithelial / LPF RARE  RARE   WBC, UA 21-50  <3 WBC/hpf   RBC / HPF 0-2  <3 RBC/hpf   Bacteria, UA FEW (*) RARE  BASIC METABOLIC PANEL     Status: Abnormal   Collection Time    06/14/13  5:00 AM      Result Value Range   Sodium 133 (*) 135 - 145 mEq/L   Potassium 3.5  3.5 - 5.1 mEq/L   Chloride 98  96 - 112 mEq/L   CO2 27  19 - 32 mEq/L   Glucose, Bld 97  70 - 99 mg/dL   BUN 8  6 - 23 mg/dL   Creatinine, Ser 0.71  0.50 - 1.10 mg/dL   Calcium 9.2  8.4 - 10.5 mg/dL   GFR calc non Af Amer >90  >90 mL/min   GFR calc Af Amer >90  >90 mL/min  CBC WITH DIFFERENTIAL     Status: Abnormal   Collection Time    06/14/13  5:00 AM      Result Value Range   WBC 3.1 (*) 4.0 - 10.5 K/uL   RBC 3.92  3.87 - 5.11 MIL/uL   Hemoglobin 11.7 (*) 12.0 - 15.0 g/dL   HCT 33.4 (*) 36.0 - 46.0 %   MCV 85.2  78.0 - 100.0 fL   MCH 29.8  26.0 - 34.0 pg   MCHC 35.0  30.0 - 36.0 g/dL   RDW 13.9  11.5 - 15.5 %   Platelets 268  150 - 400 K/uL   Neutrophils Relative % 56  43 - 77 %   Neutro Abs 1.7  1.7 - 7.7 K/uL   Lymphocytes Relative 39  12 - 46 %   Lymphs Abs  1.2  0.7 - 4.0 K/uL   Monocytes Relative 2 (*) 3 - 12 %   Monocytes Absolute 0.1  0.1 - 1.0 K/uL   Eosinophils Relative 2  0 - 5 %   Eosinophils Absolute 0.1  0.0 - 0.7 K/uL   Basophils Relative 0  0 - 1 %   Basophils Absolute 0.0  0.0 - 0.1 K/uL    Signed: Rebecca Eaton, MD 06/14/2013, 2:26 PM   Time Spent on Discharge: 35 minutes Services Ordered on Discharge: none Equipment Ordered on Discharge:  none

## 2013-06-15 NOTE — Discharge Summary (Signed)
  Date: 06/15/2013  Patient name: [name]  Medical record number: [id number]  Date of birth: 06-27-1961   This patient has been seen and the plan of care was discussed with the house staff. Please see their note for complete details. I concur with their findings and plan.  Dominic Pea, DO, [facility] 06/15/2013, 1:46 PM

## 2013-06-17 ENCOUNTER — Encounter (HOSPITAL_COMMUNITY): Payer: Self-pay | Admitting: Emergency Medicine

## 2013-06-17 ENCOUNTER — Emergency Department (HOSPITAL_COMMUNITY)
Admission: EM | Admit: 2013-06-17 | Discharge: 2013-06-17 | Disposition: A | Discharge status: Home / Self Care | Payer: Medicaid Other | Attending: Emergency Medicine | Admitting: Emergency Medicine

## 2013-06-17 DIAGNOSIS — F3289 Other specified depressive episodes: Secondary | ICD-10-CM | POA: Insufficient documentation

## 2013-06-17 DIAGNOSIS — F329 Major depressive disorder, single episode, unspecified: Secondary | ICD-10-CM | POA: Insufficient documentation

## 2013-06-17 DIAGNOSIS — L988 Other specified disorders of the skin and subcutaneous tissue: Secondary | ICD-10-CM | POA: Insufficient documentation

## 2013-06-17 DIAGNOSIS — Z862 Personal history of diseases of the blood and blood-forming organs and certain disorders involving the immune mechanism: Secondary | ICD-10-CM | POA: Insufficient documentation

## 2013-06-17 DIAGNOSIS — L959 Vasculitis limited to the skin, unspecified: Secondary | ICD-10-CM

## 2013-06-17 DIAGNOSIS — F17200 Nicotine dependence, unspecified, uncomplicated: Secondary | ICD-10-CM | POA: Insufficient documentation

## 2013-06-17 DIAGNOSIS — M069 Rheumatoid arthritis, unspecified: Secondary | ICD-10-CM | POA: Insufficient documentation

## 2013-06-17 DIAGNOSIS — I10 Essential (primary) hypertension: Secondary | ICD-10-CM | POA: Insufficient documentation

## 2013-06-17 MED ORDER — OXYCODONE-ACETAMINOPHEN 5-325 MG PO TABS: 1.0000 | ORAL_TABLET | ORAL | Status: DC | PRN

## 2013-06-17 MED ORDER — PREDNISONE 20 MG PO TABS: 60.0000 mg | ORAL_TABLET | Freq: Every day | ORAL | Status: AC

## 2013-06-17 MED ORDER — HYDROMORPHONE HCL PF 1 MG/ML IJ SOLN
1.0000 mg | Freq: Once | INTRAMUSCULAR | Status: AC
  Administered 2013-06-17: 1 mg via INTRAMUSCULAR
  Filled 2013-06-17: qty 1

## 2013-06-17 MED ORDER — PREDNISONE 20 MG PO TABS
60.0000 mg | ORAL_TABLET | Freq: Once | ORAL | Status: AC
  Administered 2013-06-17: 60 mg via ORAL
  Filled 2013-06-17: qty 3

## 2013-06-17 NOTE — ED Provider Notes (Signed)
CSN: [id number]     Arrival date & time 06/17/13  1734 History  This chart was scribed for non-physician practitioner,Lakoda Mcanany Phoebe Sharps, PA-C, working with Mirna Mires, MD, by Marlowe Kays, ED Scribe.  This patient was seen in room WTR7/WTR7 and the patient's care was started at 6:15 PM.  Chief Complaint  Patient presents with  . Ear Problem    tips of ears black and painful  . Nose Problem    tip of nose black and painful   The history is provided by the patient. No language interpreter was used.   HPI Comments:  [name] is a 52 y.o. female with h/o cocaine-induced vasculitis affecting nose, cheeks and ears who presents to the Emergency Department complaining of severe facial pain onset 4 days since leaving the hospital from an admission for same on 06/13/13 (discharge 06/14/13). Pt states she has taken Advil and Motrin with no relief. Pt states she has not used cocaine since being discharged from hospital 4 days ago. No fever.    Past Medical History  Diagnosis Date  . 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 Followed by Dr. Louanne Skye  . Hypertension   . Cocaine abuse     ongoing with resultant vaculitis.  . Tobacco abuse   . Depression   . Normocytic anemia     BL Hgb 9.8-12. Last anemia panel 04/2010 - showing Fe 19, ferritin 101.  Pt on monthly B12 injections  . Rheumatoid arthritis(714.0)     patient reported  . Inflammatory arthritis    Past Surgical History  Procedure Laterality Date  . Skin biopsy      bilateral shin nodules on 04/2010  . Hernia repair     Family History  Problem Relation Age of Onset  . Breast cancer Mother     Breast cancer  . Alcohol abuse Mother   . Colon cancer Maternal Aunt 63  . Alcohol abuse Father    History  Substance Use Topics  . Smoking status: Current Every Day Smoker -- 0.50 packs/day for 39 years    Types: Cigarettes  . Smokeless tobacco: Never Used     Comment: trying  . Alcohol Use: No   OB History   Grav Para Term  Preterm Abortions TAB SAB Ect Mult Living                 Review of Systems  Constitutional: Negative for fever.  Eyes: Negative for visual disturbance.  Skin: Positive for rash (bilateral cheeks and nose) and wound.  All other systems reviewed and are negative.   Allergies  Acetaminophen  Home Medications   Current Outpatient Rx  Name  Route  Sig  Dispense  Refill  . aspirin EC 81 MG tablet   Oral   Take 273 mg by mouth once.         Marland Kitchen ibuprofen (ADVIL,MOTRIN) 600 MG tablet   Oral   Take 1 tablet (600 mg total) by mouth every 6 (six) hours as needed for pain.   30 tablet   0   . traMADol (ULTRAM) 50 MG tablet   Oral   Take 1 tablet (50 mg total) by mouth every 6 (six) hours as needed for pain.   60 tablet   0    Triage Vitals: BP 155/79  Pulse 97  Temp(Src) 98.4 F (36.9 C) (Oral)  Resp 20  Wt 102 lb (46.267 kg)  BMI 19.92 kg/m2  SpO2 98% Physical Exam  Nursing  note and vitals reviewed. Constitutional: She is oriented to person, place, and time. She appears well-developed and well-nourished.  Uncomfortable in appearance, tearful.  HENT:  Tip of nose and ear pinna bilaterally are necrotic in appearance. No bleeding, drainage. Significantly tender to any palpation.   Eyes: Pupils are equal, round, and reactive to light.  Neck: Normal range of motion.  Pulmonary/Chest: Effort normal.  Neurological: She is alert and oriented to person, place, and time.    ED Course  Procedures (including critical care time) DIAGNOSTIC STUDIES: Oxygen Saturation is 98% on RA, normal by my interpretation.   COORDINATION OF CARE: 5:40 PM- Pt verbalizes understanding and agrees to plan.  Medications - No data to display  Labs Review Labs Reviewed - No data to display Imaging Review No results found.  MDM  No diagnosis found. 1. Cocaine-induced vasculitis  Previous chart reviewed. No steroids were given during recent hospitalization and it was felt her symptoms were  improving without need for steroids on discharge. Will being Prednisone tonight. IM pain medication provided. No home narcotics were prescribed based on her substance abuse history, however, pain is uncontrolled requiring further emergency evaluation/management. Will give limited number and encourage follow up with PCP for further pain control.   I personally performed the services described in this documentation, which was scribed in my presence. The recorded information has been reviewed and is accurate.     Dewaine Oats, PA-C 06/17/13 1953

## 2013-06-17 NOTE — ED Notes (Signed)
Pt reports recurrent pain in tips of hear and tip of nose. Tissue black, no skin turgor

## 2013-06-19 ENCOUNTER — Ambulatory Visit (INDEPENDENT_AMBULATORY_CARE_PROVIDER_SITE_OTHER): Payer: Medicaid Other | Admitting: Internal Medicine

## 2013-06-19 ENCOUNTER — Encounter: Payer: Self-pay | Admitting: Internal Medicine

## 2013-06-19 ENCOUNTER — Other Ambulatory Visit (HOSPITAL_COMMUNITY)
Admission: RE | Admit: 2013-06-19 | Discharge: 2013-06-19 | Disposition: A | Discharge status: Home / Self Care | Payer: Medicaid Other | Source: Ambulatory Visit | Attending: Internal Medicine | Admitting: Internal Medicine

## 2013-06-19 VITALS — BP 156/96 | HR 106 | Temp 97.7°F | Ht 60.0 in | Wt 92.5 lb

## 2013-06-19 DIAGNOSIS — L988 Other specified disorders of the skin and subcutaneous tissue: Secondary | ICD-10-CM

## 2013-06-19 DIAGNOSIS — Z113 Encounter for screening for infections with a predominantly sexual mode of transmission: Secondary | ICD-10-CM | POA: Insufficient documentation

## 2013-06-19 DIAGNOSIS — L959 Vasculitis limited to the skin, unspecified: Secondary | ICD-10-CM

## 2013-06-19 DIAGNOSIS — I776 Arteritis, unspecified: Secondary | ICD-10-CM

## 2013-06-19 DIAGNOSIS — I10 Essential (primary) hypertension: Secondary | ICD-10-CM

## 2013-06-19 DIAGNOSIS — N898 Other specified noninflammatory disorders of vagina: Secondary | ICD-10-CM

## 2013-06-19 DIAGNOSIS — F14988 Cocaine use, unspecified with other cocaine-induced disorder: Secondary | ICD-10-CM | POA: Insufficient documentation

## 2013-06-19 DIAGNOSIS — N760 Acute vaginitis: Secondary | ICD-10-CM | POA: Insufficient documentation

## 2013-06-19 MED ORDER — PREDNISONE 20 MG PO TABS: 40.0000 mg | ORAL_TABLET | Freq: Every day | ORAL | Status: DC

## 2013-06-19 MED ORDER — OXYCODONE-ACETAMINOPHEN 5-325 MG PO TABS: 1.0000 | ORAL_TABLET | Freq: Three times a day (TID) | ORAL | Status: DC | PRN

## 2013-06-19 NOTE — Patient Instructions (Addendum)
General Instructions: Please take Prednisone 40 mg (2 tablets) per day for 5 days  Please take pain medication up to three times a day (every 8 hours as needed) for pain Follow up in 2 weeks in clinic [phone]  Please stop cocaine    Treatment Goals:  Goals (1 Years of Data) as of 06/19/13         As of Today 06/17/13 06/17/13 06/14/13 06/14/13     Blood Pressure    . Blood Pressure < 140/90  156/96 170/106 155/79 134/68 106/62     Lifestyle    . Quit smoking / using tobacco           Result Component    . LDL CALC < 160            Progress Toward Treatment Goals:  Treatment Goal 06/19/2013  Blood pressure deteriorated  Stop smoking unable to assess    Self Care Goals & Plans:  Self Care Goal 06/19/2013  Manage my medications take my medicines as prescribed; bring my medications to every visit; refill my medications on time  Monitor my health keep track of my blood pressure  Eat healthy foods drink diet soda or water instead of juice or soda; eat more vegetables; eat foods that are low in salt; eat baked foods instead of fried foods; eat fruit for snacks and desserts; eat smaller portions  Be physically active find an activity I enjoy  Stop smoking -  Meeting treatment goals maintain the current self-care plan       Care Management & Community Referrals:  Referral 06/19/2013  Referrals made for care management support none needed  Referrals made to community resources none       Cocaine Abuse and Chemical Dependency WHEN IS DRUG USE A PROBLEM? Anytime drug use is interfering with normal living activities it has become abuse. This includes problems with family and friends. Psychological dependence has developed when your mind tells you that the drug is needed. This is usually followed by physical dependence which has developed when continuing increases of drug are required to get the same feeling or "high". This is known as addiction or chemical dependency. A  person's risk is much higher if there is a history of chemical dependency in the family. SIGNS OF CHEMICAL DEPENDENCY:  Been told by friends or family that drugs have become a problem.  Fighting when using drugs.  Having blackouts (not remembering what you do while using).  Feel sick from using drugs but continue using.  Lie about use or amounts of drugs (chemicals) used.  Need chemicals to get you going.  Suffer in work Systems analyst or school because of drug use.  Get sick from use of drugs but continue to use anyway.  Need drugs to relate to people or feel comfortable in social situations.  Use drugs to forget problems. Yes answered to any of the above signs of chemical dependency indicates there are problems. The longer the use of drugs continues, the greater the problems will become. If there is a family history of drug or alcohol use it is best not to experiment with these drugs. Experimentation leads to tolerance and needing to use more of the drug to get the same feeling. This is followed by addiction where drugs become the most important part of life. It becomes more important to take drugs than participate in the other usual activities of life including relating to friends and family. Addiction is followed by dependency  where drugs are now needed not just to get high but to feel normal. Addiction cannot be cured but it can be stopped. This often requires outside help and the care of professionals. Treatment centers are listed in the yellow pages under: Cocaine, Narcotics, and Alcoholics Anonymous. Most hospitals and clinics can refer you to a specialized care center. WHAT IS COCAINE? Cocaine is a strong nervous system stimulant which speeds up the body and gives the user the feeling that they have increased energy, loss of appetite and feelings of great pleasure. This "high" which begins within several minutes and lasts for less than an hour is followed by a "crash". The crash and  depressed feelings that come with it cause a craving for the drug to regain the high. HOW IS COCANINE USED? Cocaine is snorted, injected, and smoked as free- base or crack. Because smoking the drug produces a greater high it is also associated with a greater low. It is therefore more rapidly addicting. WHAT ARE THE EFFECTS OF COCAINE? It is an anesthetic (pain killer) and a stimulant (it causes a high which gives a false feeling of well being). It increases heart and breathing rates with increases in body temperature and blood pressure. It removes appetite. It causes seizures (convulsions) along with nausea (feeling sick to your stomach), vomiting and stomach pain. This dangerous combination can lead to death. Trying to keep the high feeling leads to greater and greater drug use and this leads to addiction. Addiction can only be helped by stopping use of all chemicals. This is hard but may save your life. If the addiction is continued, the only possible outcome is loss of self respect and self esteem, violence, death, and eventually prison if the addict is fortunate enough to be caught and able to receive help prior to this last life ending event. OTHER HEALTH RISKS OF COCAINE AND ALL DRUG USE ARE:  The increased possibility of getting AIDS or hepatitis (liver inflammation).  Having a baby born which is addicted to cocaine and must go through painful withdrawal including shaking, jerking, and crying in pain. Many of the babies die. Other babies go through life with lifelong disabilities and learning problems. HOW TO STAY DRUG FREE ONCE YOU HAVE QUIT USING:  Develop healthy activities and form friends who do not use drugs.  Stay away from the drug scene.  Tell the pusher or former friend you have other better things to do.  Have ready excuses available about why you cannot use. For more help or information contact your local physician, clinic, hospital or dial 1-800-cocaine  [phone]). Document Released: 09/18/2000 Document Revised: 12/14/2011 Document Reviewed: 05/09/2008 [facility] Patient Information 2014 [place].  Pain Medicine Instructions You have been given a prescription for pain medicines. These medicines may affect your ability to think clearly. They may also affect your ability to perform physical activities. Take these medicines only as needed for pain. You do not need to take them if you are not having pain, unless directed by your caregiver. You can take less than the prescribed dose if you find a smaller amount of medicine controls the pain. It may not be possible to make all of your pain go away, but you should be comfortable enough to move, breathe, and take care of yourself. After you start taking pain medicines, while taking the medicines, and for 8 hours after stopping the medicines:  Do not drive.  Do not operate machinery.  Do not operate power tools.  Do not  sign legal documents.  Do not supervise children by yourself.  Do not participate in activities that require climbing or being in high places.  Do not enter a body of water (lake, river, ocean, spa, swimming pool) without an adult nearby who can help you. You may have been prescribed a pain medicine that contains acetaminophen (paracetamol). If so, take only the amount directed by your caregiver. Do not take any other acetaminophen while taking this medicine. An overdose of acetaminophen can result in severe liver damage. If you are taking other medicines, check the active ingredients for acetaminophen. Acetaminophen is found in hundreds of over-the-counter and prescription medicines. These include cold relief products, menstrual cramp relief medicines, fever-reducing medicines, acid indigestion relief products, and pain relief products. HOME CARE INSTRUCTIONS   Do not drink alcohol, take sleeping pills, or take other medicines until at least 8 hours after your last dose of  pain medicine, or as directed by your caregiver.  Use a bulk stool softener if you become constipated from your pain medicines. Increasing your intake of fruits and vegetables will also help.  Write down the times when you take your medicines. Look at the times before taking your next dose of medicine. It is easy to become confused while on pain medicines. Recording the times helps you to avoid an overdose. SEEK MEDICAL CARE IF:  Your medicine is not helping the pain go away.  You vomit or have diarrhea shortly after taking the medicine.  You develop new pain in areas that did not hurt before. SEEK IMMEDIATE MEDICAL CARE IF:  You feel dizzy or faint.  You feel there are other problems that might be caused by your medicine. MAKE SURE YOU:   Understand these instructions.  Will watch your condition.  Will get help right away if you are not doing well or get worse. Document Released: 12/28/2000 Document Revised: 12/14/2011 Document Reviewed: 09/05/2010 [facility] Patient Information 2014 [place], [place].  Hypertension As your heart beats, it forces blood through your arteries. This force is your blood pressure. If the pressure is too high, it is called hypertension (HTN) or high blood pressure. HTN is dangerous because you may have it and not know it. High blood pressure may mean that your heart has to work harder to pump blood. Your arteries may be narrow or stiff. The extra work puts you at risk for heart disease, stroke, and other problems.  Blood pressure consists of two numbers, a higher number over a lower, 110/72, for example. It is stated as "110 over 72." The ideal is below 120 for the top number (systolic) and under 80 for the bottom (diastolic). Write down your blood pressure today. You should pay close attention to your blood pressure if you have certain conditions such as:  Heart failure.  Prior heart attack.  Diabetes  Chronic kidney disease.  Prior  stroke.  Multiple risk factors for heart disease. To see if you have HTN, your blood pressure should be measured while you are seated with your arm held at the level of the heart. It should be measured at least twice. A one-time elevated blood pressure reading (especially in the Emergency Department) does not mean that you need treatment. There may be conditions in which the blood pressure is different between your right and left arms. It is important to see your caregiver soon for a recheck. Most people have essential hypertension which means that there is not a specific cause. This type of high blood pressure may  be lowered by changing lifestyle factors such as:  Stress.  Smoking.  Lack of exercise.  Excessive weight.  Drug/tobacco/alcohol use.  Eating less salt. Most people do not have symptoms from high blood pressure until it has caused damage to the body. Effective treatment can often prevent, delay or reduce that damage. TREATMENT  When a cause has been identified, treatment for high blood pressure is directed at the cause. There are a large number of medications to treat HTN. These fall into several categories, and your caregiver will help you select the medicines that are best for you. Medications may have side effects. You should review side effects with your caregiver. If your blood pressure stays high after you have made lifestyle changes or started on medicines,   Your medication(s) may need to be changed.  Other problems may need to be addressed.  Be certain you understand your prescriptions, and know how and when to take your medicine.  Be sure to follow up with your caregiver within the time frame advised (usually within two weeks) to have your blood pressure rechecked and to review your medications.  If you are taking more than one medicine to lower your blood pressure, make sure you know how and at what times they should be taken. Taking two medicines at the same time  can result in blood pressure that is too low. SEEK IMMEDIATE MEDICAL CARE IF:  You develop a severe headache, blurred or changing vision, or confusion.  You have unusual weakness or numbness, or a faint feeling.  You have severe chest or abdominal pain, vomiting, or breathing problems. MAKE SURE YOU:   Understand these instructions.  Will watch your condition.  Will get help right away if you are not doing well or get worse. Document Released: 09/21/2005 Document Revised: 12/14/2011 Document Reviewed: 05/11/2008 [facility] Patient Information 2014 [place].

## 2013-06-19 NOTE — Progress Notes (Signed)
  Subjective:    [name], female    DOB: 1961/09/12, 52 y.o.   MRN: [id number]  HPI Comments: 52 y.o PMH arthritis, cocaine dependence, depression, HTN (BP 156/96), HLD, levamisole induced vasculitis, tobacco abuse.  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  She has been in worse pain in these areas for 5-6 days.  She was given Ultram and Advil which is not helping and she has not filled her Prednisone and Percocet due to affordability.  Last use of cocaine was 06/13/13.  She denies dysuria.  When asked about SI she states maybe but does not have a plan and has never tried.  She is crying excessively.  She went to the ED 9/13 and was given Percocet and 9/14 and was given Prednisone for sx's.  She also c/o yellow thick itchy discharge in vaginal area.  She denies being sexually active currently.    ROS per HPI     Review of Systems     Objective:   Physical Exam  Constitutional: She is cooperative.  Tearful, thin  HENT:  Head: Normocephalic.  Necrosis to b/l ears  Necrosis to checks and nose   Cardiovascular: S1 normal and S2 normal.  Tachycardia present.   Genitourinary: Cervix exhibits discharge. Cervix exhibits no motion tenderness and no friability.  Thick yellow green discharge   Neurological: She is alert.  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

## 2013-06-19 NOTE — Progress Notes (Signed)
Case discussed with Dr. McLean at the time of the visit.  We reviewed the resident's history and exam and pertinent patient test results.  I agree with the assessment, diagnosis, and plan of care documented in the resident's note.     

## 2013-06-19 NOTE — ED Provider Notes (Signed)
Medical screening examination/treatment/procedure(s) were conducted as a shared visit with non-physician practitioner(s) and myself.  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 C/o pain from her lesions on nose/ears, other. No fevers. No gangrenous areas, no cellulitis. Pain rx.   Mirna Mires, MD 06/19/13 [phone]

## 2013-06-19 NOTE — Assessment & Plan Note (Addendum)
Will Rx Prednisone 40 mg x 5 days then stop-given samples from clinic  Rx Percocet 5-325 mg tid prn #45 no refills. Discarded ED Rx for both medications  Counseled on substance cessation  RTC 2 weeks

## 2013-06-19 NOTE — Assessment & Plan Note (Signed)
Will Rx Prednisone 40 mg x 5 days then stop-given samples from clinic  Rx Percocet 5-325 mg tid prn. Discarded ED Rx for both medications  Counseled on substance cessation  RTC 2 weeks

## 2013-06-19 NOTE — Assessment & Plan Note (Signed)
BP Readings from Last 3 Encounters:  06/19/13 156/96  06/17/13 170/106  06/14/13 134/68    Lab Results  Component Value Date   NA 133* 06/14/2013   K 3.5 06/14/2013   CREATININE 0.71 06/14/2013    Assessment: Blood pressure control: mildly elevated Progress toward BP goal:  deteriorated Comments: likely elevated due to emotions and pain  Plan: Medications:  none currently.  Needs to be reassessed at follow up and consider TZD or ACEI Educational resources provided: brochure Self management tools provided: home blood pressure logbook;other (see comments) Other plans: see above

## 2013-06-19 NOTE — Addendum Note (Signed)
Addended by: Cresenciano Genre on: 06/19/2013 04:31 PM   Modules accepted: Orders

## 2013-06-19 NOTE — Assessment & Plan Note (Signed)
Wet prep, GC/C for thick yellow green discharge  Will await results

## 2013-06-20 ENCOUNTER — Encounter: Payer: Self-pay | Admitting: Internal Medicine

## 2013-06-20 ENCOUNTER — Other Ambulatory Visit: Payer: Self-pay | Admitting: Internal Medicine

## 2013-06-20 MED ORDER — METRONIDAZOLE 500 MG PO TABS: 2000.0000 mg | ORAL_TABLET | Freq: Once | ORAL | Status: DC

## 2013-06-21 ENCOUNTER — Ambulatory Visit: Payer: Self-pay | Admitting: Internal Medicine

## 2013-06-22 ENCOUNTER — Emergency Department (HOSPITAL_COMMUNITY)
Admission: EM | Admit: 2013-06-22 | Discharge: 2013-06-23 | Disposition: A | Discharge status: Other Acute Inpatient Hospital | Payer: Medicaid Other | Attending: Emergency Medicine | Admitting: Emergency Medicine

## 2013-06-22 ENCOUNTER — Encounter (HOSPITAL_COMMUNITY): Payer: Self-pay | Admitting: Emergency Medicine

## 2013-06-22 DIAGNOSIS — R52 Pain, unspecified: Secondary | ICD-10-CM

## 2013-06-22 DIAGNOSIS — F17200 Nicotine dependence, unspecified, uncomplicated: Secondary | ICD-10-CM | POA: Insufficient documentation

## 2013-06-22 DIAGNOSIS — F329 Major depressive disorder, single episode, unspecified: Secondary | ICD-10-CM | POA: Insufficient documentation

## 2013-06-22 DIAGNOSIS — I10 Essential (primary) hypertension: Secondary | ICD-10-CM | POA: Insufficient documentation

## 2013-06-22 DIAGNOSIS — M069 Rheumatoid arthritis, unspecified: Secondary | ICD-10-CM | POA: Insufficient documentation

## 2013-06-22 DIAGNOSIS — R51 Headache: Secondary | ICD-10-CM | POA: Insufficient documentation

## 2013-06-22 DIAGNOSIS — F1410 Cocaine abuse, uncomplicated: Secondary | ICD-10-CM

## 2013-06-22 DIAGNOSIS — I776 Arteritis, unspecified: Secondary | ICD-10-CM

## 2013-06-22 DIAGNOSIS — H9209 Otalgia, unspecified ear: Secondary | ICD-10-CM | POA: Insufficient documentation

## 2013-06-22 DIAGNOSIS — F339 Major depressive disorder, recurrent, unspecified: Secondary | ICD-10-CM

## 2013-06-22 DIAGNOSIS — Z862 Personal history of diseases of the blood and blood-forming organs and certain disorders involving the immune mechanism: Secondary | ICD-10-CM | POA: Insufficient documentation

## 2013-06-22 DIAGNOSIS — IMO0002 Reserved for concepts with insufficient information to code with codable children: Secondary | ICD-10-CM | POA: Insufficient documentation

## 2013-06-22 DIAGNOSIS — F3289 Other specified depressive episodes: Secondary | ICD-10-CM | POA: Insufficient documentation

## 2013-06-22 DIAGNOSIS — R45851 Suicidal ideations: Secondary | ICD-10-CM

## 2013-06-22 DIAGNOSIS — F411 Generalized anxiety disorder: Secondary | ICD-10-CM | POA: Insufficient documentation

## 2013-06-22 LAB — COMPREHENSIVE METABOLIC PANEL
Albumin: 3.5 g/dL (ref 3.5–5.2)
Alkaline Phosphatase: 72 U/L (ref 39–117)
BUN: 19 mg/dL (ref 6–23)
Calcium: 9.6 mg/dL (ref 8.4–10.5)
Creatinine, Ser: 0.82 mg/dL (ref 0.50–1.10)
GFR calc Af Amer: >90 mL/min (ref >90)
Glucose, Bld: 154 mg/dL — ABNORMAL HIGH (ref 70–99)
Total Protein: 7.5 g/dL (ref 6.0–8.3)

## 2013-06-22 LAB — ACETAMINOPHEN LEVEL: Acetaminophen (Tylenol), Serum: <15 ug/mL (ref 10–30)

## 2013-06-22 LAB — ETHANOL: Alcohol, Ethyl (B): <11 mg/dL (ref 0–11)

## 2013-06-22 LAB — RAPID URINE DRUG SCREEN, HOSP PERFORMED
Barbiturates: NOT DETECTED
Benzodiazepines: NOT DETECTED
Cocaine: POSITIVE — AB
Opiates: NOT DETECTED

## 2013-06-22 LAB — SALICYLATE LEVEL: Salicylate Lvl: <2 mg/dL — ABNORMAL LOW (ref 2.8–20.0)

## 2013-06-22 LAB — CBC
HCT: 34 % — ABNORMAL LOW (ref 36.0–46.0)
Hemoglobin: 12 g/dL (ref 12.0–15.0)
MCH: 29.8 pg (ref 26.0–34.0)
MCHC: 35.3 g/dL (ref 30.0–36.0)
MCV: 84.4 fL (ref 78.0–100.0)
RDW: 13.3 % (ref 11.5–15.5)

## 2013-06-22 MED ORDER — POTASSIUM CHLORIDE CRYS ER 20 MEQ PO TBCR
40.0000 meq | EXTENDED_RELEASE_TABLET | Freq: Once | ORAL | Status: AC
  Administered 2013-06-22: 40 meq via ORAL
  Filled 2013-06-22: qty 2

## 2013-06-22 MED ORDER — OXYCODONE-ACETAMINOPHEN 5-325 MG PO TABS
1.0000 | ORAL_TABLET | Freq: Four times a day (QID) | ORAL | Status: DC | PRN
  Administered 2013-06-22 – 2013-06-23 (×3): 2 via ORAL
  Filled 2013-06-22 (×3): qty 2

## 2013-06-22 MED ORDER — OXYCODONE-ACETAMINOPHEN 5-325 MG PO TABS
2.0000 | ORAL_TABLET | Freq: Once | ORAL | Status: AC
  Administered 2013-06-22: 2 via ORAL
  Filled 2013-06-22: qty 2

## 2013-06-22 MED ORDER — HYDROMORPHONE HCL PF 1 MG/ML IJ SOLN
1.0000 mg | Freq: Once | INTRAMUSCULAR | Status: AC
  Administered 2013-06-22: 1 mg via INTRAMUSCULAR
  Filled 2013-06-22: qty 1

## 2013-06-22 MED ORDER — LORAZEPAM 1 MG PO TABS
1.0000 mg | ORAL_TABLET | Freq: Three times a day (TID) | ORAL | Status: DC | PRN
  Administered 2013-06-22: 1 mg via ORAL
  Filled 2013-06-22: qty 1

## 2013-06-22 MED ORDER — MORPHINE SULFATE 4 MG/ML IJ SOLN: 8.0000 mg | Freq: Once | INTRAMUSCULAR | Status: DC

## 2013-06-22 NOTE — ED Notes (Addendum)
Pt reports SI w/o a plan.  Sts "I can't do this anymore.  The pain is too bad.  I want to kill myself. I want to end it."  Sts "I need help for these thoughts and to help me stop using."  Pt reports using cocaine last night.

## 2013-06-22 NOTE — ED Provider Notes (Signed)
CSN: [id number]     Arrival date & time 06/22/13  1313 History   First MD Initiated Contact with Patient 06/22/13 1338     Chief Complaint  Patient presents with  . Ear pain/necrotic    . Medical Clearance   (Consider location/radiation/quality/duration/timing/severity/associated sxs/prior Treatment) HPI Comments: 52 yo female with htn, drug induced psychotic disorder, substance abuse, vasculitis secondary to cocaine use presents with facial pain and suicidal ideation the past day.   The past week she has had difficulty controlling facial pain, she cannot afford her pain meds. She "cannot do this anymore" and wants to kill herself.  She did not have any attempts.  She has had previous thoughts in the past. No current plan. Pain at vasculitis sites on ears/face.   The history is provided by the patient.    Past Medical History  Diagnosis Date  . 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 Followed by Dr. Louanne Skye  . Hypertension   . Cocaine abuse     ongoing with resultant vaculitis.  . Tobacco abuse   . Depression   . Normocytic anemia     BL Hgb 9.8-12. Last anemia panel 04/2010 - showing Fe 19, ferritin 101.  Pt on monthly B12 injections  . Rheumatoid arthritis(714.0)     patient reported  . Inflammatory arthritis    Past Surgical History  Procedure Laterality Date  . Skin biopsy      bilateral shin nodules on 04/2010  . Hernia repair     Family History  Problem Relation Age of Onset  . Breast cancer Mother     Breast cancer  . Alcohol abuse Mother   . Colon cancer Maternal Aunt 55  . Alcohol abuse Father    History  Substance Use Topics  . Smoking status: Current Every Day Smoker -- 0.50 packs/day for 39 years    Types: Cigarettes  . Smokeless tobacco: Never Used     Comment: trying  . Alcohol Use: No   OB History   Grav Para Term Preterm Abortions TAB SAB Ect Mult Living                 Review of Systems  Constitutional: Negative for fever and chills.  HENT:  Positive for ear pain. Negative for neck pain and neck stiffness.   Eyes: Negative for visual disturbance.  Respiratory: Negative for shortness of breath.   Cardiovascular: Negative for chest pain.  Gastrointestinal: Negative for vomiting and abdominal pain.  Genitourinary: Negative for dysuria and flank pain.  Musculoskeletal: Negative for back pain.  Skin: Negative for rash.  Neurological: Negative for light-headedness and headaches.  Psychiatric/Behavioral: Positive for suicidal ideas.    Allergies  Acetaminophen  Home Medications   Current Outpatient Rx  Name  Route  Sig  Dispense  Refill  . aspirin EC 81 MG tablet   Oral   Take 243 mg by mouth once.          Marland Kitchen ibuprofen (ADVIL,MOTRIN) 600 MG tablet   Oral   Take 1 tablet (600 mg total) by mouth every 6 (six) hours as needed for pain.   30 tablet   0   . metroNIDAZOLE (FLAGYL) 500 MG tablet   Oral   Take 4 tablets (2,000 mg total) by mouth once.   4 tablet   0     Generic   . predniSONE (DELTASONE) 20 MG tablet   Oral   Take 2 tablets (40 mg total) by mouth daily.  10 tablet   0   . traMADol (ULTRAM) 50 MG tablet   Oral   Take 1 tablet (50 mg total) by mouth every 6 (six) hours as needed for pain.   60 tablet   0   . oxyCODONE-acetaminophen (PERCOCET/ROXICET) 5-325 MG per tablet   Oral   Take 1-2 tablets by mouth every 4 (four) hours as needed for pain.   10 tablet   0   . oxyCODONE-acetaminophen (PERCOCET/ROXICET) 5-325 MG per tablet   Oral   Take 1 tablet by mouth every 8 (eight) hours as needed for pain.   45 tablet   0    BP 175/78  Pulse 101  Temp(Src) 97.9 F (36.6 C) (Oral)  Resp 20  SpO2 100% Physical Exam  Nursing note and vitals reviewed. Constitutional: She is oriented to person, place, and time. She appears well-developed and well-nourished.  HENT:  Head: Normocephalic.  Necrotic vasculitis of ears bilateral and cheeks bilateral, no surrounding discharge/ erythema or warmth   Eyes: Conjunctivae are normal. Right eye exhibits no discharge. Left eye exhibits no discharge.  Neck: Normal range of motion. Neck supple. No tracheal deviation present.  Cardiovascular: Normal rate and regular rhythm.   Pulmonary/Chest: Effort normal and breath sounds normal.  Abdominal: Soft. She exhibits no distension. There is no tenderness. There is no guarding.  Musculoskeletal: She exhibits no edema.  Neurological: She is alert and oriented to person, place, and time.  Skin: Skin is warm. No rash noted.  Psychiatric: Her mood appears anxious. Her affect is not blunt. She is not slowed and not actively hallucinating. She expresses suicidal ideation. She expresses suicidal plans. She expresses no homicidal plans.    ED Course  Procedures (including critical care time) Labs Review Labs Reviewed  CBC - Abnormal; Notable for the following:    HCT 34.0 (*)    Platelets 441 (*)    All other components within normal limits  COMPREHENSIVE METABOLIC PANEL - Abnormal; Notable for the following:    Potassium 3.1 (*)    Glucose, Bld 154 (*)    Total Bilirubin 0.2 (*)    GFR calc non Af Amer 82 (*)    All other components within normal limits  SALICYLATE LEVEL - Abnormal; Notable for the following:    Salicylate Lvl <3.3 (*)    All other components within normal limits  URINE RAPID DRUG SCREEN (HOSP PERFORMED) - Abnormal; Notable for the following:    Cocaine POSITIVE (*)    All other components within normal limits  ACETAMINOPHEN LEVEL  ETHANOL   Imaging Review No results found.  MDM   1. Suicidal ideation   2. Inadequate pain control   3. Vasculitis   4. Cocaine abuse    Known substance abuse hx. Suicidal ideation likely component of pain control and drug abuse.  Labs done.  No signs of infection at this time.  Pt medically clear for psychiatric assessment. Transferred to psych ed for consult.  Rechecked, pain meds given.  Final dispo pending.    Mariea Clonts,  MD 06/22/13 [phone]

## 2013-06-22 NOTE — BH Assessment (Signed)
[facility] Assessment Progress Note      Attempted to assess pt, but she is in too much pain.  Very tearful and crying out in pain requesting to leave.  ED RNs notified.  Seeking alternate medication plan for patient.  Will continue to follow.

## 2013-06-22 NOTE — ED Notes (Signed)
Pt has necrotic ears and states that pain/buring has gotten worse today. Tip of nose and ears are black.

## 2013-06-22 NOTE — ED Notes (Signed)
Upon entering room, patient very tearful stating she felt like her face was burning and wanted pain to stop. Asked patient if used cocaine since last visit, she said "Yes, once. I can't lie to you." Patient continued to cry, saying "I just can't take it anymore." Asked if she had thoughts of hurting herself, patient stated "Yes." Asked last time she had these thoughts, patient stated "About 30 minutes ago. I need to go inpatient across the street." RN [place] notified of conversation.

## 2013-06-22 NOTE — Consult Note (Signed)
[facility] Face-to-Face Psychiatry Consult   Reason for Consult:  52 year old female present to ED with SI secondary to pain, Referring Physician:  [name] is an 52 y.o. female.  Assessment: AXIS I:  Depressive Disorder NOS and Substance Abuse AXIS II:  Deferred AXIS III:   Past Medical History  Diagnosis Date  . 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 Followed by Dr. Louanne Skye  . Hypertension   . Cocaine abuse     ongoing with resultant vaculitis.  . Tobacco abuse   . Depression   . Normocytic anemia     BL Hgb 9.8-12. Last anemia panel 04/2010 - showing Fe 19, ferritin 101.  Pt on monthly B12 injections  . Rheumatoid arthritis(714.0)     patient reported  . Inflammatory arthritis    AXIS IV:  economic problems and problems related to social environment AXIS V:  11-20 some danger of hurting self or others possible OR occasionally fails to maintain minimal personal hygiene OR gross impairment in communication  Plan:  Recommend psychiatric Inpatient admission when medically cleared.  Subjective:   [name] is a 52 y.o. female patient who presented to ED with SI secondary pain.  She experiences bilateral ear pain and nose pain.  She presents with vasculitis bilateral ears and nose.  She reports SI began this morning as a result of the pain that she has been experiencing.  She denies a plan.  Requesting detox.  She states that she will not be able to detox outpatient because she cannot detox and be out on the streets because she has access to her drug of choice.  She denies SI presently, last had SI prior to moving to [facility] ED.  She denies HI however she does report that if someone gets in her way she does not know how she will react.  She is pleasant and cooperative.  She reports that she hears voices when she is angry telling her to keep calm but she will sometimes turn around what the voices are telling her.  She expresses concern re: bank card coming in the mail and "people being  dirty" and is afraid that someone will steal her card if she is not at home when it arrives in the mail.  She expresses that she feels as if she can quit cocaine use but when asked if she wants to she sates, "I need to so that this pain will go away" did not express that she "wanted" to quit.  She continues, "everyday spent off of the streets is a good day".  She reports that years ago she was going to undergo treatment at [facility] but they refused to accept her as a patient because she had a sore on her leg.  She was at [facility] for detox 05/2013 and reports that she "liked it there" and feels that treatment would be helpful.             Past Psychiatric History: Past Medical History  Diagnosis Date  . 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 Followed by Dr. Louanne Skye  . Hypertension   . Cocaine abuse     ongoing with resultant vaculitis.  . Tobacco abuse   . Depression   . Normocytic anemia     BL Hgb 9.8-12. Last anemia panel 04/2010 - showing Fe 19, ferritin 101.  Pt on monthly B12 injections  . Rheumatoid arthritis(714.0)     patient reported  . Inflammatory arthritis  reports that she has been smoking Cigarettes.  She has a 19.5 pack-year smoking history. She has never used smokeless tobacco. She reports that she uses illicit drugs ("Crack" cocaine, Cocaine, and Marijuana). She reports that she does not drink alcohol. Family History  Problem Relation Age of Onset  . Breast cancer Mother     Breast cancer  . Alcohol abuse Mother   . Colon cancer Maternal Aunt 67  . Alcohol abuse Father            Allergies:   Allergies  Allergen Reactions  . Acetaminophen Swelling    REACTION: eyelid swelling    Objective: Blood pressure 170/90, pulse 97, temperature 98.5 F (36.9 C), temperature source Oral, resp. rate 20, SpO2 97.00%.There is no weight on file to calculate BMI. Results for orders placed during the hospital encounter of 06/22/13 (from the past 72 hour(s))  ACETAMINOPHEN LEVEL      Status: None   Collection Time    06/22/13  1:51 PM      Result Value Range   Acetaminophen (Tylenol), Serum <15.0  10 - 30 ug/mL   Comment:            THERAPEUTIC CONCENTRATIONS VARY     SIGNIFICANTLY. A RANGE OF 10-30     ug/mL MAY BE AN EFFECTIVE     CONCENTRATION FOR MANY PATIENTS.     HOWEVER, SOME ARE BEST TREATED     AT CONCENTRATIONS OUTSIDE THIS     RANGE.     ACETAMINOPHEN CONCENTRATIONS     >150 ug/mL AT 4 HOURS AFTER     INGESTION AND >50 ug/mL AT 12     HOURS AFTER INGESTION ARE     OFTEN ASSOCIATED WITH TOXIC     REACTIONS.  CBC     Status: Abnormal   Collection Time    06/22/13  1:51 PM      Result Value Range   WBC 8.2  4.0 - 10.5 K/uL   RBC 4.03  3.87 - 5.11 MIL/uL   Hemoglobin 12.0  12.0 - 15.0 g/dL   HCT 34.0 (*) 36.0 - 46.0 %   MCV 84.4  78.0 - 100.0 fL   MCH 29.8  26.0 - 34.0 pg   MCHC 35.3  30.0 - 36.0 g/dL   RDW 13.3  11.5 - 15.5 %   Platelets 441 (*) 150 - 400 K/uL  COMPREHENSIVE METABOLIC PANEL     Status: Abnormal   Collection Time    06/22/13  1:51 PM      Result Value Range   Sodium 137  135 - 145 mEq/L   Potassium 3.1 (*) 3.5 - 5.1 mEq/L   Chloride 98  96 - 112 mEq/L   CO2 26  19 - 32 mEq/L   Glucose, Bld 154 (*) 70 - 99 mg/dL   BUN 19  6 - 23 mg/dL   Creatinine, Ser 0.82  0.50 - 1.10 mg/dL   Calcium 9.6  8.4 - 10.5 mg/dL   Total Protein 7.5  6.0 - 8.3 g/dL   Albumin 3.5  3.5 - 5.2 g/dL   AST 14  0 - 37 U/L   ALT 9  0 - 35 U/L   Alkaline Phosphatase 72  39 - 117 U/L   Total Bilirubin 0.2 (*) 0.3 - 1.2 mg/dL   GFR calc non Af Amer 82 (*) >90 mL/min   GFR calc Af Amer >90  >90 mL/min   Comment: (NOTE)  The eGFR has been calculated using the CKD EPI equation.     This calculation has not been validated in all clinical situations.     eGFR's persistently <90 mL/min signify possible Chronic Kidney     Disease.  ETHANOL     Status: None   Collection Time    06/22/13  1:51 PM      Result Value Range   Alcohol, Ethyl (B) <11  0 - 11  mg/dL   Comment:            LOWEST DETECTABLE LIMIT FOR     SERUM ALCOHOL IS 11 mg/dL     FOR MEDICAL PURPOSES ONLY  SALICYLATE LEVEL     Status: Abnormal   Collection Time    06/22/13  1:51 PM      Result Value Range   Salicylate Lvl <4.2 (*) 2.8 - 20.0 mg/dL  URINE RAPID DRUG SCREEN (HOSP PERFORMED)     Status: Abnormal   Collection Time    06/22/13  2:16 PM      Result Value Range   Opiates NONE DETECTED  NONE DETECTED   Cocaine POSITIVE (*) NONE DETECTED   Benzodiazepines NONE DETECTED  NONE DETECTED   Amphetamines NONE DETECTED  NONE DETECTED   Tetrahydrocannabinol NONE DETECTED  NONE DETECTED   Barbiturates NONE DETECTED  NONE DETECTED   Comment:            DRUG SCREEN FOR MEDICAL PURPOSES     ONLY.  IF CONFIRMATION IS NEEDED     FOR ANY PURPOSE, NOTIFY LAB     WITHIN 5 DAYS.                LOWEST DETECTABLE LIMITS     FOR URINE DRUG SCREEN     Drug Class       Cutoff (ng/mL)     Amphetamine      1000     Barbiturate      200     Benzodiazepine   683     Tricyclics       419     Opiates          300     Cocaine          300     THC              50   Labs reviewed- stable.   Current Facility-Administered Medications  Medication Dose Route Frequency Provider Last Rate Last Dose  . LORazepam (ATIVAN) tablet 1 mg  1 mg Oral Q8H PRN Mariea Clonts, MD   1 mg at 06/22/13 1807  . oxyCODONE-acetaminophen (PERCOCET/ROXICET) 5-325 MG per tablet 1-2 tablet  1-2 tablet Oral Q6H PRN Mariea Clonts, MD   2 tablet at 06/22/13 2115   Current Outpatient Prescriptions  Medication Sig Dispense Refill  . aspirin EC 81 MG tablet Take 243 mg by mouth once.       Marland Kitchen ibuprofen (ADVIL,MOTRIN) 600 MG tablet Take 1 tablet (600 mg total) by mouth every 6 (six) hours as needed for pain.  30 tablet  0  . metroNIDAZOLE (FLAGYL) 500 MG tablet Take 4 tablets (2,000 mg total) by mouth once.  4 tablet  0  . predniSONE (DELTASONE) 20 MG tablet Take 2 tablets (40 mg total) by mouth daily.  10  tablet  0  . traMADol (ULTRAM) 50 MG tablet Take 1 tablet (50 mg total) by mouth every 6 (six) hours as needed for  pain.  60 tablet  0  . oxyCODONE-acetaminophen (PERCOCET/ROXICET) 5-325 MG per tablet Take 1-2 tablets by mouth every 4 (four) hours as needed for pain.  10 tablet  0  . oxyCODONE-acetaminophen (PERCOCET/ROXICET) 5-325 MG per tablet Take 1 tablet by mouth every 8 (eight) hours as needed for pain.  45 tablet  0    Psychiatric Specialty Exam:     Blood pressure 170/90, pulse 97, temperature 98.5 F (36.9 C), temperature source Oral, resp. rate 20, SpO2 97.00%.There is no weight on file to calculate BMI.  General Appearance: Well Groomed  Engineer, water::  Minimal  Speech:  Clear and Coherent and Normal Rate  Volume:  Normal  Mood:  Anxious and Depressed  Affect:  Appropriate  Thought Process:  Coherent  Orientation:  Full (Time, Place, and Person)  Thought Content:  Negative  Suicidal Thoughts:  Yes.  without intent/plan  Homicidal Thoughts:  No  Memory:  Immediate;   Fair  Judgement:  Impaired  Insight:  Fair  Psychomotor Activity:  Negative  Concentration:  Fair  Recall:  Fair  Akathisia:  Negative  Handed:    AIMS (if indicated):     Assets:  Armed forces logistics/support/administrative officer Housing Physical Health  Sleep:      Treatment Plan Summary: 1) Meeting inpatient criteria for crisis management, safety, and stabilization of substance abuse, depression, and anxiety pending bed [address] [facility] 2) SW to aid or facilitate in outpatient support services and Psychiatric management  3) Management of applicable co-mobidities  4) Administration of medications and/or psychotherapuetic interventions    [name] 06/22/2013 9:52 PM  I agreed with the findings, treatment and disposition plan of this patient. Berniece Andreas, MD

## 2013-06-23 ENCOUNTER — Encounter (HOSPITAL_COMMUNITY): Payer: Self-pay

## 2013-06-23 ENCOUNTER — Ambulatory Visit: Payer: Self-pay | Admitting: Internal Medicine

## 2013-06-23 ENCOUNTER — Inpatient Hospital Stay (HOSPITAL_COMMUNITY)
Admission: AD | Admit: 2013-06-23 | Discharge: 2013-06-28 | DRG: 885 | Disposition: A | Discharge status: Home / Self Care | Payer: Medicaid Other | Source: Intra-hospital | Attending: Psychiatry | Admitting: Psychiatry

## 2013-06-23 DIAGNOSIS — Z79899 Other long term (current) drug therapy: Secondary | ICD-10-CM

## 2013-06-23 DIAGNOSIS — R45851 Suicidal ideations: Secondary | ICD-10-CM

## 2013-06-23 DIAGNOSIS — F332 Major depressive disorder, recurrent severe without psychotic features: Principal | ICD-10-CM | POA: Diagnosis present

## 2013-06-23 DIAGNOSIS — L089 Local infection of the skin and subcutaneous tissue, unspecified: Secondary | ICD-10-CM

## 2013-06-23 DIAGNOSIS — M79642 Pain in left hand: Secondary | ICD-10-CM

## 2013-06-23 DIAGNOSIS — E876 Hypokalemia: Secondary | ICD-10-CM

## 2013-06-23 DIAGNOSIS — Z720 Tobacco use: Secondary | ICD-10-CM

## 2013-06-23 DIAGNOSIS — N898 Other specified noninflammatory disorders of vagina: Secondary | ICD-10-CM

## 2013-06-23 DIAGNOSIS — F1994 Other psychoactive substance use, unspecified with psychoactive substance-induced mood disorder: Secondary | ICD-10-CM

## 2013-06-23 DIAGNOSIS — L03114 Cellulitis of left upper limb: Secondary | ICD-10-CM

## 2013-06-23 DIAGNOSIS — F1910 Other psychoactive substance abuse, uncomplicated: Secondary | ICD-10-CM

## 2013-06-23 DIAGNOSIS — F19959 Other psychoactive substance use, unspecified with psychoactive substance-induced psychotic disorder, unspecified: Secondary | ICD-10-CM

## 2013-06-23 DIAGNOSIS — F14120 Cocaine abuse with intoxication, uncomplicated: Secondary | ICD-10-CM

## 2013-06-23 DIAGNOSIS — I10 Essential (primary) hypertension: Secondary | ICD-10-CM

## 2013-06-23 DIAGNOSIS — F1420 Cocaine dependence, uncomplicated: Secondary | ICD-10-CM

## 2013-06-23 DIAGNOSIS — F329 Major depressive disorder, single episode, unspecified: Secondary | ICD-10-CM

## 2013-06-23 DIAGNOSIS — I776 Arteritis, unspecified: Secondary | ICD-10-CM

## 2013-06-23 DIAGNOSIS — N6320 Unspecified lump in the left breast, unspecified quadrant: Secondary | ICD-10-CM

## 2013-06-23 DIAGNOSIS — F29 Unspecified psychosis not due to a substance or known physiological condition: Secondary | ICD-10-CM

## 2013-06-23 DIAGNOSIS — R636 Underweight: Secondary | ICD-10-CM

## 2013-06-23 DIAGNOSIS — M129 Arthropathy, unspecified: Secondary | ICD-10-CM

## 2013-06-23 DIAGNOSIS — I309 Acute pericarditis, unspecified: Secondary | ICD-10-CM

## 2013-06-23 DIAGNOSIS — F3289 Other specified depressive episodes: Secondary | ICD-10-CM

## 2013-06-23 DIAGNOSIS — F32A Depression, unspecified: Secondary | ICD-10-CM

## 2013-06-23 DIAGNOSIS — E785 Hyperlipidemia, unspecified: Secondary | ICD-10-CM

## 2013-06-23 MED ORDER — ALUM & MAG HYDROXIDE-SIMETH 200-200-20 MG/5ML PO SUSP: 30.0000 mL | ORAL | Status: DC | PRN

## 2013-06-23 MED ORDER — HYDROXYZINE HCL 25 MG PO TABS
25.0000 mg | ORAL_TABLET | Freq: Four times a day (QID) | ORAL | Status: AC | PRN
  Administered 2013-06-24 – 2013-06-25 (×3): 25 mg via ORAL

## 2013-06-23 MED ORDER — TRAZODONE HCL 50 MG PO TABS
50.0000 mg | ORAL_TABLET | Freq: Every evening | ORAL | Status: DC | PRN
  Administered 2013-06-23 – 2013-06-27 (×5): 50 mg via ORAL
  Filled 2013-06-23 (×5): qty 1

## 2013-06-23 MED ORDER — FOLIC ACID 1 MG PO TABS
1.0000 mg | ORAL_TABLET | Freq: Every day | ORAL | Status: DC
  Administered 2013-06-24 – 2013-06-28 (×5): 1 mg via ORAL
  Filled 2013-06-23 (×7): qty 1

## 2013-06-23 MED ORDER — CLONIDINE HCL 0.1 MG PO TABS
0.1000 mg | ORAL_TABLET | Freq: Once | ORAL | Status: AC
  Administered 2013-06-23: 0.1 mg via ORAL
  Filled 2013-06-23: qty 1

## 2013-06-23 MED ORDER — VITAMIN B-1 100 MG PO TABS
100.0000 mg | ORAL_TABLET | Freq: Every day | ORAL | Status: DC
  Administered 2013-06-24 – 2013-06-28 (×5): 100 mg via ORAL
  Filled 2013-06-23 (×7): qty 1

## 2013-06-23 MED ORDER — IBUPROFEN 200 MG PO TABS
400.0000 mg | ORAL_TABLET | Freq: Four times a day (QID) | ORAL | Status: DC | PRN
  Administered 2013-06-23: 400 mg via ORAL
  Filled 2013-06-23: qty 2

## 2013-06-23 MED ORDER — LORAZEPAM 1 MG PO TABS
2.0000 mg | ORAL_TABLET | Freq: Once | ORAL | Status: AC
  Administered 2013-06-23: 2 mg via ORAL
  Filled 2013-06-23: qty 2

## 2013-06-23 MED ORDER — THIAMINE HCL 100 MG/ML IJ SOLN: 100.0000 mg | Freq: Every day | INTRAMUSCULAR | Status: DC

## 2013-06-23 MED ORDER — ADULT MULTIVITAMIN W/MINERALS CH
1.0000 | ORAL_TABLET | Freq: Every day | ORAL | Status: DC
  Administered 2013-06-24 – 2013-06-28 (×5): 1 via ORAL
  Filled 2013-06-23 (×7): qty 1

## 2013-06-23 MED ORDER — LORAZEPAM 2 MG/ML IJ SOLN: 1.0000 mg | Freq: Four times a day (QID) | INTRAMUSCULAR | Status: AC | PRN

## 2013-06-23 MED ORDER — LISINOPRIL 10 MG PO TABS
10.0000 mg | ORAL_TABLET | Freq: Every day | ORAL | Status: DC
  Administered 2013-06-24 – 2013-06-28 (×5): 10 mg via ORAL
  Filled 2013-06-23 (×8): qty 1

## 2013-06-23 MED ORDER — LORAZEPAM 1 MG PO TABS
1.0000 mg | ORAL_TABLET | Freq: Three times a day (TID) | ORAL | Status: DC | PRN
  Administered 2013-06-24 – 2013-06-27 (×5): 1 mg via ORAL
  Filled 2013-06-23 (×3): qty 1

## 2013-06-23 MED ORDER — OXYCODONE HCL 5 MG PO TABS
5.0000 mg | ORAL_TABLET | Freq: Four times a day (QID) | ORAL | Status: DC | PRN
  Administered 2013-06-23: 5 mg via ORAL
  Administered 2013-06-24 – 2013-06-27 (×12): 10 mg via ORAL
  Administered 2013-06-28 (×2): 5 mg via ORAL
  Filled 2013-06-23: qty 1
  Filled 2013-06-23 (×2): qty 2
  Filled 2013-06-23: qty 1
  Filled 2013-06-23: qty 2
  Filled 2013-06-23: qty 1
  Filled 2013-06-23 (×9): qty 2

## 2013-06-23 MED ORDER — MAGNESIUM HYDROXIDE 400 MG/5ML PO SUSP
30.0000 mL | Freq: Every day | ORAL | Status: DC | PRN
  Administered 2013-06-26: 30 mL via ORAL

## 2013-06-23 MED ORDER — MAGNESIUM HYDROXIDE 400 MG/5ML PO SUSP: 30.0000 mL | Freq: Every day | ORAL | Status: DC | PRN

## 2013-06-23 MED ORDER — HYDROXYZINE HCL 25 MG PO TABS: 25.0000 mg | ORAL_TABLET | Freq: Every evening | ORAL | Status: DC | PRN

## 2013-06-23 MED ORDER — LISINOPRIL 10 MG PO TABS
10.0000 mg | ORAL_TABLET | Freq: Every day | ORAL | Status: DC
  Administered 2013-06-23: 10 mg via ORAL
  Filled 2013-06-23 (×2): qty 1

## 2013-06-23 MED ORDER — LORAZEPAM 1 MG PO TABS
1.0000 mg | ORAL_TABLET | Freq: Four times a day (QID) | ORAL | Status: AC | PRN
  Administered 2013-06-25: 1 mg via ORAL
  Filled 2013-06-23 (×3): qty 1

## 2013-06-23 NOTE — ED Notes (Signed)
Pt. Signed all D/C paperwork, pt. Escorted to [facility] by [facility], 1 bag of pt. Belongings taken to [facility] with pt.  Pt ambulated out of psych ed without any difficulty.

## 2013-06-23 NOTE — Progress Notes (Signed)
Psychoeducational Group Note  Date:  06/23/2013 Time:  2000  Group Topic/Focus:  Wrap-Up Group:   The focus of this group is to help patients review their daily goal of treatment and discuss progress on daily workbooks.  Participation Level: Did Not Attend  Participation Quality:  Not Applicable  Affect:  Not Applicable  Cognitive:  Not Applicable  Insight:  Not Applicable  Engagement in Group: Not Applicable  Additional Comments:  The patient did not attend group since she was asleep in her bed.   Archie Balboa S 06/23/2013, 10:06 PM

## 2013-06-23 NOTE — ED Notes (Signed)
Notified EDP of pt.'s B/P, new medication orders given.

## 2013-06-23 NOTE — Progress Notes (Signed)
Pt psych orders in for admission to [facility] once pt bp is controlled. EDP aware of bp needs.   Dorathy Kinsman, LCSW [phone]  ED CSW .06/23/2013 1200pm

## 2013-06-23 NOTE — Tx Team (Signed)
Initial Interdisciplinary Treatment Plan  PATIENT STRENGTHS: (choose at least two) Average or above average intelligence Communication skills Supportive family/friends  PATIENT STRESSORS: Health problems Medication change or noncompliance Substance abuse   PROBLEM LIST: Problem List/Patient Goals Date to be addressed Date deferred Reason deferred Estimated date of resolution  depression 06/23/2013     anxiety 06/23/2013     Substance abuse 06/23/2013     Suicidal ideation 06/23/2013                                    DISCHARGE CRITERIA:  Ability to meet basic life and health needs Adequate post-discharge living arrangements Improved stabilization in mood, thinking, and/or behavior Motivation to continue treatment in a less acute level of care Need for constant or close observation no longer present Reduction of life-threatening or endangering symptoms to within safe limits Safe-care adequate arrangements made Verbal commitment to aftercare and medication compliance  PRELIMINARY DISCHARGE PLAN: Attend aftercare/continuing care group Outpatient therapy Placement in alternative living arrangements Return to previous living arrangement  PATIENT/FAMIILY INVOLVEMENT: This treatment plan has been presented to and reviewed with the patient, [name], and/or family member, The patient and family have been given the opportunity to ask questions and make suggestions.  [name], [name] 06/23/2013, 11:00 PM

## 2013-06-23 NOTE — ED Notes (Signed)
Pt. Given new B/P medications, pt. States that she has not taken her B/P medicaiton since she left ALF 3 months ago.  Pt. Restarted on home B/P meds.  Will continue to monitor pt's B/P.

## 2013-06-23 NOTE — Progress Notes (Signed)
Patient accepted to [facility] by [facility] pending [address] bed.   Dorathy Kinsman, LCSW [phone]  ED CSW .06/23/2013 10:29am

## 2013-06-23 NOTE — ED Notes (Signed)
Spoke with Ena Dawley, RN at [facility], per Ena Dawley, her charge RN called [facility] and got report. Ena Dawley states that it is ok to send pt. Now.

## 2013-06-23 NOTE — Consult Note (Signed)
[facility] Psychiatry Consult   Reason for Consult:  Suicidal thoughts, Major depressive d/o, Cocaine dependence Referring Physician:  EDP KRYSTEENA STALKER is an 52 y.o. female.  Assessment: AXIS I:  Major Depression, Recurrent severe and Substance Abuse AXIS II:  Deferred AXIS III:   Past Medical History  Diagnosis Date  . 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 Followed by Dr. Louanne Skye  . Hypertension   . Cocaine abuse     ongoing with resultant vaculitis.  . Tobacco abuse   . Depression   . Normocytic anemia     BL Hgb 9.8-12. Last anemia panel 04/2010 - showing Fe 19, ferritin 101.  Pt on monthly B12 injections  . Rheumatoid arthritis(714.0)     patient reported  . Inflammatory arthritis    AXIS IV:  housing problems, occupational problems, other psychosocial or environmental problems, problems related to social environment and problems with primary support group AXIS V:  51-60 moderate symptoms  Plan:  Recommend psychiatric Inpatient admission when medically cleared.  Subjective:   JAQUEL COOMER is a 52 y.o. female patient admitted with DEPRESSIVE D/O, COCAINE DEPENDENCE.  HPI:  Patient voluntarily brought self to the ER seeking treatment for Cocaine dependence and depression.  On arrival to the ER she was endorsing suicide and stated she is tired of using cocaine.  Patient states she was diagnosed with de[pression 3 years ago and was placed on Prozac.  She states she has not been compliant with her Prozac but uses cocaine on a daily basis.   She also came in for bilteral ear pain, cheek and nose steeming from Cocaine Vasculitis.  Patient states this am she is tired of using cocaine and will like to stop.  She states she will like to start her Prozac.  Patient is homeless and lives with a female friend helping him with his ADLS.  She denies SI/HI/AVH but she reported occasionally hearing voices especially when she is angry.  We will defer treatment for auditory hallucination until  her depressive symptoms are taken care of.  HPI Elements:   Location:  [facility]. Quality:  severe to the extent of contemplating suicide. Severity:  severe. Duration:  chronic cocaine dependence. Context:  Bilateral ear, tip of her nose and cheek pain.  Past Psychiatric History: Past Medical History  Diagnosis Date  . 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 Followed by Dr. Louanne Skye  . Hypertension   . Cocaine abuse     ongoing with resultant vaculitis.  . Tobacco abuse   . Depression   . Normocytic anemia     BL Hgb 9.8-12. Last anemia panel 04/2010 - showing Fe 19, ferritin 101.  Pt on monthly B12 injections  . Rheumatoid arthritis(714.0)     patient reported  . Inflammatory arthritis     reports that she has been smoking Cigarettes.  She has a 19.5 pack-year smoking history. She has never used smokeless tobacco. She reports that she uses illicit drugs ("Crack" cocaine, Cocaine, and Marijuana). She reports that she does not drink alcohol. Family History  Problem Relation Age of Onset  . Breast cancer Mother     Breast cancer  . Alcohol abuse Mother   . Colon cancer Maternal Aunt 37  . Alcohol abuse Father            Allergies:   Allergies  Allergen Reactions  . Acetaminophen Swelling    REACTION: eyelid swelling    ACT Assessment Complete:  Yes:  Educational Status    Risk to Self: Risk to self Is patient at risk for suicide?: No Substance abuse history and/or treatment for substance abuse?: Yes  Risk to Others:  DENIES  Abuse:  DENIES  Prior Inpatient Therapy:  NONE  Prior Outpatient Therapy:  NONE  Additional Information:                    Objective: Blood pressure 190/94, pulse 70, temperature 98.4 F (36.9 C), temperature source Oral, resp. rate 18, SpO2 100.00%.There is no weight on file to calculate BMI. Results for orders placed during the hospital encounter of 06/22/13 (from the past 72 hour(s))  ACETAMINOPHEN LEVEL     Status: None    Collection Time    06/22/13  1:51 PM      Result Value Range   Acetaminophen (Tylenol), Serum <15.0  10 - 30 ug/mL   Comment:            THERAPEUTIC CONCENTRATIONS VARY     SIGNIFICANTLY. A RANGE OF 10-30     ug/mL MAY BE AN EFFECTIVE     CONCENTRATION FOR MANY PATIENTS.     HOWEVER, SOME ARE BEST TREATED     AT CONCENTRATIONS OUTSIDE THIS     RANGE.     ACETAMINOPHEN CONCENTRATIONS     >150 ug/mL AT 4 HOURS AFTER     INGESTION AND >50 ug/mL AT 12     HOURS AFTER INGESTION ARE     OFTEN ASSOCIATED WITH TOXIC     REACTIONS.  CBC     Status: Abnormal   Collection Time    06/22/13  1:51 PM      Result Value Range   WBC 8.2  4.0 - 10.5 K/uL   RBC 4.03  3.87 - 5.11 MIL/uL   Hemoglobin 12.0  12.0 - 15.0 g/dL   HCT 34.0 (*) 36.0 - 46.0 %   MCV 84.4  78.0 - 100.0 fL   MCH 29.8  26.0 - 34.0 pg   MCHC 35.3  30.0 - 36.0 g/dL   RDW 13.3  11.5 - 15.5 %   Platelets 441 (*) 150 - 400 K/uL  COMPREHENSIVE METABOLIC PANEL     Status: Abnormal   Collection Time    06/22/13  1:51 PM      Result Value Range   Sodium 137  135 - 145 mEq/L   Potassium 3.1 (*) 3.5 - 5.1 mEq/L   Chloride 98  96 - 112 mEq/L   CO2 26  19 - 32 mEq/L   Glucose, Bld 154 (*) 70 - 99 mg/dL   BUN 19  6 - 23 mg/dL   Creatinine, Ser 0.82  0.50 - 1.10 mg/dL   Calcium 9.6  8.4 - 10.5 mg/dL   Total Protein 7.5  6.0 - 8.3 g/dL   Albumin 3.5  3.5 - 5.2 g/dL   AST 14  0 - 37 U/L   ALT 9  0 - 35 U/L   Alkaline Phosphatase 72  39 - 117 U/L   Total Bilirubin 0.2 (*) 0.3 - 1.2 mg/dL   GFR calc non Af Amer 82 (*) >90 mL/min   GFR calc Af Amer >90  >90 mL/min   Comment: (NOTE)     The eGFR has been calculated using the CKD EPI equation.     This calculation has not been validated in all clinical situations.     eGFR's persistently <90 mL/min signify possible Chronic Kidney  Disease.  ETHANOL     Status: None   Collection Time    06/22/13  1:51 PM      Result Value Range   Alcohol, Ethyl (B) <11  0 - 11 mg/dL    Comment:            LOWEST DETECTABLE LIMIT FOR     SERUM ALCOHOL IS 11 mg/dL     FOR MEDICAL PURPOSES ONLY  SALICYLATE LEVEL     Status: Abnormal   Collection Time    06/22/13  1:51 PM      Result Value Range   Salicylate Lvl <0.7 (*) 2.8 - 20.0 mg/dL  URINE RAPID DRUG SCREEN (HOSP PERFORMED)     Status: Abnormal   Collection Time    06/22/13  2:16 PM      Result Value Range   Opiates NONE DETECTED  NONE DETECTED   Cocaine POSITIVE (*) NONE DETECTED   Benzodiazepines NONE DETECTED  NONE DETECTED   Amphetamines NONE DETECTED  NONE DETECTED   Tetrahydrocannabinol NONE DETECTED  NONE DETECTED   Barbiturates NONE DETECTED  NONE DETECTED   Comment:            DRUG SCREEN FOR MEDICAL PURPOSES     ONLY.  IF CONFIRMATION IS NEEDED     FOR ANY PURPOSE, NOTIFY LAB     WITHIN 5 DAYS.                LOWEST DETECTABLE LIMITS     FOR URINE DRUG SCREEN     Drug Class       Cutoff (ng/mL)     Amphetamine      1000     Barbiturate      200     Benzodiazepine   121     Tricyclics       975     Opiates          300     Cocaine          300     THC              50   Labs are reviewed and are pertinent for Positive cocaine.  Current Facility-Administered Medications  Medication Dose Route Frequency Provider Last Rate Last Dose  . lisinopril (PRINIVIL,ZESTRIL) tablet 10 mg  10 mg Oral Daily Blanchie Dessert, MD      . oxyCODONE-acetaminophen (PERCOCET/ROXICET) 5-325 MG per tablet 1-2 tablet  1-2 tablet Oral Q6H PRN Mariea Clonts, MD   2 tablet at 06/23/13 8832   Current Outpatient Prescriptions  Medication Sig Dispense Refill  . aspirin EC 81 MG tablet Take 243 mg by mouth once.       Marland Kitchen ibuprofen (ADVIL,MOTRIN) 600 MG tablet Take 1 tablet (600 mg total) by mouth every 6 (six) hours as needed for pain.  30 tablet  0  . metroNIDAZOLE (FLAGYL) 500 MG tablet Take 4 tablets (2,000 mg total) by mouth once.  4 tablet  0  . predniSONE (DELTASONE) 20 MG tablet Take 2 tablets (40 mg total) by  mouth daily.  10 tablet  0  . traMADol (ULTRAM) 50 MG tablet Take 1 tablet (50 mg total) by mouth every 6 (six) hours as needed for pain.  60 tablet  0  . oxyCODONE-acetaminophen (PERCOCET/ROXICET) 5-325 MG per tablet Take 1-2 tablets by mouth every 4 (four) hours as needed for pain.  10 tablet  0  . oxyCODONE-acetaminophen (PERCOCET/ROXICET) 5-325 MG per tablet  Take 1 tablet by mouth every 8 (eight) hours as needed for pain.  45 tablet  0    Psychiatric Specialty Exam:     Blood pressure 190/94, pulse 70, temperature 98.4 F (36.9 C), temperature source Oral, resp. rate 18, SpO2 100.00%.There is no weight on file to calculate BMI.  General Appearance: Casual  Eye Contact::  Good  Speech:  Clear and Coherent and Normal Rate  Volume:  Decreased  Mood:  Anxious, Depressed, Hopeless, Irritable and Worthless  Affect:  Appropriate and Congruent  Thought Process:  Coherent and Intact  Orientation:  Full (Time, Place, and Person)  Thought Content:  na  Suicidal Thoughts:  No  Homicidal Thoughts:  No  Memory:  Immediate;   Fair Recent;   Fair Remote;   Fair  Judgement:  Poor  Insight:  Shallow  Psychomotor Activity:  Normal  Concentration:  Fair  Recall:  NA  Akathisia:  NA  Handed:  Right  AIMS (if indicated):     Assets:  Desire for Improvement Financial Resources/Insurance Housing  Sleep:      Treatment Plan Summary:  Consult and face to face interview with Dr Lovena Le We will admit patient to our inpatient unit for safety and stabilization We will restart her Prozac and add Abilify for her mood. Daily contact with patient to assess and evaluate symptoms and progress in treatment Medication management  Delfin Gant   PMHNP-BC 06/23/2013 11:16 AM

## 2013-06-23 NOTE — Progress Notes (Signed)
Pt accepted to [facility] 506-1 pending updated vitals. CSW informed RN.   Marland KitchenDorathy Kinsman, [place]  ED CSW .06/23/2013 1037am

## 2013-06-23 NOTE — Progress Notes (Signed)
[name], female   DOB: 1961/01/21, 52 y.o.   MRN: [id number] Admission note: D:Patient is a  Voluntary admission in no acute distress for depression, substance abuse, and suicidal ideation without a plan. Pt stated suffering from depression for a long and is tired of being in pain. Pt has vasculitis on bilateral ears and tip of nose from cocaine abuse. Pt denies above disorder from the cocaine abuse but states "something is hurting her from inside her body". Pt reports using cocaine everyday last use was before coming to ED. Pt stated she has not been compliant with her antidepressant and would like to be stated on Prozac. Pt was crying and trying to pull scabs off the tip of ear and was unable to answer most of writers questions. Pt reports pain was 10 out of a 0-10 scale. Pt denies SI/HI/AVH.  A: Pt was admitted to unit by a previous nurse. Medications administered as prescribed. Writer encouraged pt to discuss feelings. Pt encouraged to come to staff with any question or concerns.  R: Pt was receptive to education. Writer offered support. Continue current POC.

## 2013-06-24 DIAGNOSIS — F14120 Cocaine abuse with intoxication, uncomplicated: Secondary | ICD-10-CM | POA: Diagnosis present

## 2013-06-24 DIAGNOSIS — F1410 Cocaine abuse, uncomplicated: Secondary | ICD-10-CM

## 2013-06-24 DIAGNOSIS — F332 Major depressive disorder, recurrent severe without psychotic features: Principal | ICD-10-CM

## 2013-06-24 DIAGNOSIS — F1994 Other psychoactive substance use, unspecified with psychoactive substance-induced mood disorder: Secondary | ICD-10-CM

## 2013-06-24 LAB — COMPREHENSIVE METABOLIC PANEL
ALT: 6 U/L (ref 0–35)
Albumin: 2.9 g/dL — ABNORMAL LOW (ref 3.5–5.2)
BUN: 20 mg/dL (ref 6–23)
CO2: 30 mEq/L (ref 19–32)
Calcium: 9 mg/dL (ref 8.4–10.5)
Creatinine, Ser: 0.79 mg/dL (ref 0.50–1.10)
GFR calc Af Amer: >90 mL/min (ref >90)
GFR calc non Af Amer: >90 mL/min (ref >90)
Glucose, Bld: 85 mg/dL (ref 70–99)
Sodium: 139 mEq/L (ref 135–145)
Total Protein: 6.3 g/dL (ref 6.0–8.3)

## 2013-06-24 NOTE — [facility] Suicide Risk Assessment (Signed)
Suicide Risk Assessment  Admission Assessment     Nursing information obtained from:  Patient Demographic factors:  Unemployed;Low socioeconomic status Current Mental Status:  Self-harm thoughts Loss Factors:  Decline in physical health;Legal issues Historical Factors:  Family history of mental illness or substance abuse Risk Reduction Factors:  Positive social support  CLINICAL FACTORS:   Severe Anxiety and/or Agitation Depression:   Aggression Anhedonia Hopelessness Impulsivity Insomnia Recent sense of peace/wellbeing Severe Alcohol/Substance Abuse/Dependencies Currently Psychotic Unstable or Poor Therapeutic Relationship Previous Psychiatric Diagnoses and Treatments Medical Diagnoses and Treatments/Surgeries  COGNITIVE FEATURES THAT CONTRIBUTE TO RISK:  Closed-mindedness Loss of executive function Polarized thinking Thought constriction (tunnel vision)    SUICIDE RISK:   Moderate:  Frequent suicidal ideation with limited intensity, and duration, some specificity in terms of plans, no associated intent, good self-control, limited dysphoria/symptomatology, some risk factors present, and identifiable protective factors, including available and accessible social support.  PLAN OF CARE: Admitted voluntarily and emergently from [facility] for substance induced depression, chronic pain and suicidal ideations.  I certify that inpatient services furnished can reasonably be expected to improve the patient's condition.   Durward Parcel., MD 06/24/2013, 12:26 PM

## 2013-06-24 NOTE — B[facility] Group Notes (Signed)
[facility] Group Notes: (Clinical Social Work)   06/24/2013      Type of Therapy:  Group Therapy   Participation Level:  Did Not Attend    Selmer Dominion, LCSW 06/24/2013, 5:04 PM

## 2013-06-24 NOTE — Progress Notes (Signed)
[name], female   DOB: 1961/06/25, 52 y.o.   MRN: [id number] D: Pt is awake and active on the unit this AM. Pt denies SI/HI and A/V hallucinations. Pt rates their depression at 0 and hopelessness at 10. Pt's mood is depressed and her affect is sad/tearfull. Pt is c/o burning pain r/t her ears and nose. Writer applied dressings to ears and pt reports greater comfort. Pt is isolative and not attending groups but she is cooperative with staff.   A: Encouraged pt to discuss feelings with staff and administered medication per MD orders. Writer also encouraged pt to participate in groups.  R: Pt is attending groups and tolerating medications well. Writer will continue to monitor. 15 minute checks are ongoing for safety.

## 2013-06-24 NOTE — Progress Notes (Signed)
Adult Psychoeducational Group Note  Date:  06/24/2013 Time:  0900  Group Topic/Focus:  Identifying Needs:   The focus of this group is to help patients identify their personal needs that have been historically problematic and identify healthy behaviors to address their needs.  Participation Level:  Did Not Attend  Pt was unable to attend due to pain and discomfort.  Ayce Pietrzyk Leanna Sato 06/24/2013, 11:58 AM

## 2013-06-24 NOTE — H&P (Signed)
Psychiatric Admission Assessment Adult  Patient Identification:  [name] Date of Evaluation:  06/24/2013 Chief Complaint:  MAJOR DEPRESSIVE DISORDER SUBSTANCE ABUSE History of Present Illness:: Patient admitted voluntarily and emergently from [facility] ER seeking treatment for depression and suicidal ideations. She has history of Cocaine dependence and depression. 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 Patient stated that she is burning inside to out side and she does endorsing suicide and stated she is tired of using cocaine. She has not been compliant with her Prozac but uses cocaine on a daily basis. She also came in for bilteral ear pain, cheek and nose steming from Cocaine Vasculitis. Patient states this am she is tired of using cocaine and will like to stop. Patient is homeless and lives with a female friend helping him with his ADLS. She reported occasionally hearing voices especially when she is angry.   Elements:  Location:  [facility] adult unit. Quality:  depression and chronic and acute pain. Severity:  suicidal . Timing:  substance abuse. Duration:  chronic. Context:  acute pain. Associated Signs/Synptoms: Depression Symptoms:  depressed mood, anhedonia, insomnia, psychomotor agitation, feelings of worthlessness/guilt, hopelessness, impaired memory, recurrent thoughts of death, suicidal thoughts with specific plan, weight loss, decreased labido, decreased appetite, (Hypo) Manic Symptoms:  Distractibility, Impulsivity, Irritable Mood, Labiality of Mood, Anxiety Symptoms:  Excessive Worry, Psychotic Symptoms:  Paranoia, PTSD Symptoms: NA  Psychiatric Specialty Exam: Physical Exam  ROS  Blood pressure 118/77, pulse 102, temperature 98.1 F (36.7 C), temperature source Oral, resp. rate 18, height 5' 1.5" (1.562 m), weight 42.638 kg (94 lb).Body mass index is 17.48 kg/(m^2).   General Appearance: Bizarre, Disheveled and Guarded  Eye Contact::  Poor  Speech:  Blocked and Slow  Volume:  Decreased  Mood:  Angry, Anxious, Depressed, Dysphoric, Hopeless, Irritable and Worthless  Affect:  Depressed and Inappropriate  Thought Process:  Disorganized and Loose  Orientation:  Full (Time, Place, and Person)  Thought Content:  Paranoid Ideation and Rumination  Suicidal Thoughts:  Yes.  without intent/plan  Homicidal Thoughts:  No  Memory:  Immediate;   Fair  Judgement:  Impaired  Insight:  Lacking  Psychomotor Activity:  Restlessness  Concentration:  Fair  Recall:  Fair  Akathisia:  NA  Handed:  Right  AIMS (if indicated):     Assets:  Communication Skills Desire for Improvement Resilience  Sleep:  Number of Hours: 6.25    Past Psychiatric History: Diagnosis: cocaine dependence and depression  Hospitalizations: [facility] and several others  Outpatient Care: [facility]  Substance Abuse Care: yes  Self-Mutilation:no  Suicidal Attempts: yes  Violent Behaviors: no   Past Medical History:   Past Medical History  Diagnosis Date  . 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 Followed by Dr. Louanne Skye  . Hypertension   . Cocaine abuse     ongoing with resultant vaculitis.  . Tobacco abuse   . Depression   . Normocytic anemia     BL Hgb 9.8-12. Last anemia panel 04/2010 - showing Fe 19, ferritin 101.  Pt on monthly B12 injections  . Rheumatoid arthritis(714.0)     patient reported  . Inflammatory arthritis    None. Allergies:   Allergies  Allergen Reactions  . Acetaminophen Swelling    REACTION: eyelid swelling   PTA Medications: Prescriptions prior to admission  Medication Sig Dispense Refill  . aspirin EC 81 MG tablet Take  243 mg by mouth once.       . predniSONE (DELTASONE) 20 MG tablet Take 2 tablets (40 mg total) by mouth daily.  10 tablet  0  . ibuprofen (ADVIL,MOTRIN) 600 MG tablet Take 1 tablet (600 mg total) by mouth every 6 (six) hours as needed for  pain.  30 tablet  0  . traMADol (ULTRAM) 50 MG tablet Take 1 tablet (50 mg total) by mouth every 6 (six) hours as needed for pain.  60 tablet  0    Previous Psychotropic Medications:  Medication/Dose  prozac                Substance Abuse History in the last 12 months:  yes  Consequences of Substance Abuse: Medical Consequences:  peripheral vasculitis  Social History:  reports that she has been smoking Cigarettes.  She has a 19.5 pack-year smoking history. She has never used smokeless tobacco. She reports that she uses illicit drugs ("Crack" cocaine, Cocaine, and Marijuana). She reports that she does not drink alcohol. Additional Social History:   Current Place of Residence:   Place of Birth:   Family Members: Marital Status:  Single Children:  Sons:  Daughters: Relationships: Education:  [employer] Problems/Performance: Religious Beliefs/Practices: History of Abuse (Emotional/Phsycial/Sexual) Ship broker History:  None. Legal History: Hobbies/Interests:  Family History:   Family History  Problem Relation Age of Onset  . Breast cancer Mother     Breast cancer  . Alcohol abuse Mother   . Colon cancer Maternal Aunt 62  . Alcohol abuse Father     Results for orders placed during the hospital encounter of 06/23/13 (from the past 72 hour(s))  COMPREHENSIVE METABOLIC PANEL     Status: Abnormal   Collection Time    06/24/13  7:02 AM      Result Value Range   Sodium 139  135 - 145 mEq/L   Potassium 3.8  3.5 - 5.1 mEq/L   Chloride 102  96 - 112 mEq/L   CO2 30  19 - 32 mEq/L   Glucose, Bld 85  70 - 99 mg/dL   BUN 20  6 - 23 mg/dL   Creatinine, Ser 0.79  0.50 - 1.10 mg/dL   Calcium 9.0  8.4 - 10.5 mg/dL   Total Protein 6.3  6.0 - 8.3 g/dL   Albumin 2.9 (*) 3.5 - 5.2 g/dL   AST 9  0 - 37 U/L   ALT 6  0 - 35 U/L   Alkaline Phosphatase 55  39 - 117 U/L   Total Bilirubin 0.2 (*) 0.3 - 1.2 mg/dL   GFR calc non Af Amer >90  >90  mL/min   GFR calc Af Amer >90  >90 mL/min   Comment: (NOTE)     The eGFR has been calculated using the CKD EPI equation.     This calculation has not been validated in all clinical situations.     eGFR's persistently <90 mL/min signify possible Chronic Kidney     Disease.     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 Followed by Dr. Louanne Skye  . Hypertension   . Cocaine abuse     ongoing with resultant vaculitis.  . Tobacco abuse   . Depression   . Normocytic anemia     BL Hgb 9.8-12. Last anemia panel 04/2010 - showing Fe 19, ferritin 101.  Pt on monthly B12 injections  . Rheumatoid arthritis(714.0)     patient reported  . Inflammatory arthritis    AXIS IV:  economic problems, educational problems, occupational problems, other psychosocial or environmental problems, problems related to social environment and problems with primary support group AXIS V:  41-50 serious symptoms  Treatment Plan/Recommendations:  Admit for crisis stabilization and safety monitoring. She will receive medication management.   Treatment Plan Summary: Daily contact with patient to assess and evaluate symptoms and progress in treatment Medication management Current Medications:  Current Facility-Administered Medications  Medication Dose Route Frequency Provider Last Rate Last Dose  . alum & mag hydroxide-simeth (MAALOX/MYLANTA)  200-200-20 MG/5ML suspension 30 mL  30 mL Oral Q4H PRN Evanna Glenda Chroman, NP      . alum & mag hydroxide-simeth (MAALOX/MYLANTA) 200-200-20 MG/5ML suspension 30 mL  30 mL Oral Q4H PRN Delfin Gant, NP      . folic acid (FOLVITE) tablet 1 mg  1 mg Oral Daily Evanna Cori Greig Castilla, NP   1 mg at 06/24/13 0751  . hydrOXYzine (ATARAX/VISTARIL) tablet 25 mg  25 mg Oral Q6H PRN Malena Peer, NP   25 mg at 06/24/13 1143  . hydrOXYzine (ATARAX/VISTARIL) tablet 25 mg  25 mg Oral QHS PRN Delfin Gant, NP      . lisinopril (PRINIVIL,ZESTRIL) tablet 10 mg  10 mg Oral Daily Delfin Gant, NP   10 mg at 06/24/13 0750  . LORazepam (ATIVAN) tablet 1 mg  1 mg Oral Q6H PRN Evanna Glenda Chroman, NP       Or  . LORazepam (ATIVAN) injection 1 mg  1 mg Intravenous Q6H PRN Evanna Glenda Chroman, NP      . LORazepam (ATIVAN) tablet 1 mg  1 mg Oral Q8H PRN Malena Peer, NP   1 mg at 06/24/13 0751  . magnesium hydroxide (MILK OF MAGNESIA) suspension 30 mL  30 mL Oral Daily PRN Evanna Glenda Chroman, NP      . magnesium hydroxide (MILK OF MAGNESIA) suspension 30 mL  30 mL Oral Daily PRN Delfin Gant, NP      . multivitamin with minerals tablet 1 tablet  1 tablet Oral Daily Evanna Glenda Chroman, NP   1 tablet at 06/24/13 0751  . oxyCODONE (Oxy IR/ROXICODONE) immediate release tablet 5-10 mg  5-10 mg Oral Q6H PRN Malena Peer, NP   10 mg at 06/24/13 0751  . thiamine (VITAMIN B-1) tablet 100 mg  100 mg Oral Daily Evanna Cori Greig Castilla, NP   100 mg at 06/24/13 0751   Or  . thiamine (B-1) injection 100 mg  100 mg Intravenous Daily Evanna Glenda Chroman, NP      . traZODone (DESYREL) tablet 50 mg  50 mg Oral QHS PRN,MR X 1 Evanna Cori Greig Castilla, NP   50 mg at 06/23/13 2226    Observation Level/Precautions:  15 minute checks  Laboratory:  Reviewed admission labs  Psychotherapy:  Group and milieu therapy  Medications:  Prozac and Tazodone  Consultations:  none  Discharge Concerns:  safety   Estimated LOS: 4-7  days  Other:     I certify that inpatient services furnished can reasonably be expected to improve the patient's condition.    Durward Parcel., MD 9/20/201412:28 PM

## 2013-06-25 DIAGNOSIS — F329 Major depressive disorder, single episode, unspecified: Secondary | ICD-10-CM

## 2013-06-25 MED ORDER — SULFAMETHOXAZOLE-TMP DS 800-160 MG PO TABS
1.0000 | ORAL_TABLET | Freq: Two times a day (BID) | ORAL | Status: DC
  Administered 2013-06-25 – 2013-06-28 (×6): 1 via ORAL
  Filled 2013-06-25 (×4): qty 1
  Filled 2013-06-25: qty 4
  Filled 2013-06-25: qty 1
  Filled 2013-06-25: qty 4
  Filled 2013-06-25 (×4): qty 1

## 2013-06-25 MED ORDER — ENSURE COMPLETE PO LIQD
237.0000 mL | Freq: Two times a day (BID) | ORAL | Status: DC
  Administered 2013-06-25 – 2013-06-28 (×5): 237 mL via ORAL

## 2013-06-25 MED ORDER — BACITRACIN-NEOMYCIN-POLYMYXIN OINTMENT TUBE
TOPICAL_OINTMENT | Freq: Three times a day (TID) | CUTANEOUS | Status: DC | PRN
  Filled 2013-06-25: qty 15

## 2013-06-25 NOTE — Progress Notes (Signed)
Adult Psychoeducational Group Note  Date:  06/25/2013 Time:  8:00am Group Topic/Focus:  Wrap-Up Group:   The focus of this group is to help patients review their daily goal of treatment and discuss progress on daily workbooks.  Participation Level:  Did Not Attend  Participation Quality:   Affect:    Cognitive:    Insight:  Engagement in Group:   Modes of Intervention:    Additional Comments:  Pt did not attend group.  Marlowe Shores D 06/25/2013, 9:32 PM

## 2013-06-25 NOTE — B[facility] Counselor (Signed)
Adult Psychosocial Assessment Update Interdisciplinary Team  Previous [facility] admissions/discharges:  Admissions Discharges  Date:  05/14/13 Date: 05/17/13  Date: Date:  Date: Date:  Date: Date:  Date: Date:   Changes since the last Psychosocial Assessment (including adherence to outpatient mental health and/or substance abuse treatment, situational issues contributing to decompensation and/or relapse). Is still living in the same house with a friend of hers.  Was advised at last discharge to go to [facility] for mental health follow-up, did not do so, not able to say why.  Did follow up at [facility] for the pain in her face.  Does not know how long she stayed sober after discharge, but does know she has relapsed on crack cocaine multiple times.  States she uses it "every now and then."             Discharge Plan 1. Will you be returning to the same living situation after discharge?   Yes:  XX No:      If no, what is your plan?    Will live with friend.       2. Would you like a referral for services when you are discharged? Yes:     If yes, for what services?  No:   XX    States she does not know if she is willing to accept referrals.       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 The past week she has had difficulty controlling facial pain, as she cannot afford her pain meds, and has been to the ED several times. She states she "cannot do this anymore" and wants to kill herself.  She lives with a friend, and will return there at discharge.  Although she did follow up with [facility] for medical issues involving necrosis, she did not follow up at [facility] as advised and is not certain she is willing to do so.  She would benefit from safety monitoring,  medication evaluation, psychoeducation, group therapy, and discharge planning to link with ongoing resources.                         Signature:  Lysle Dingwall, 06/25/2013 8:31 AM

## 2013-06-25 NOTE — Progress Notes (Signed)
[facility] MD Progress Note  06/25/2013 2:06 PM [name]  MRN:  [id number]  Subjective:  [name] is seen today. She has been feeling better and sleeping well. She is taking medication and no side effects reported. She has been sleeping and eating fine. She feels continue to be depressed, anxious and passive suicidal thoughts. She denied craving cocaine. She is not able to participate in groups and staying in her bed. She is focussed on her ear and scab coming off.   Diagnosis:   DSM5: Schizophrenia Disorders:   Obsessive-Compulsive Disorders:   Trauma-Stressor Disorders:   Substance/Addictive Disorders:   Depressive Disorders:    Axis I: Depressive Disorder NOS, Substance Induced Mood Disorder and Cocaine abuse with intoxication and without complication.  ADL's:  Impaired  Sleep: Fair  Appetite:  Fair  Suicidal Ideation:  Denied  Homicidal Ideation:  denied AEB (as evidenced by):  Psychiatric Specialty Exam: ROS  Blood pressure 128/88, pulse 106, temperature 97.9 F (36.6 C), temperature source Oral, resp. rate 16, height 5' 1.5" (1.562 m), weight 42.638 kg (94 lb).Body mass index is 17.48 kg/(m^2).  General Appearance: Guarded  Eye Contact::  Minimal  Speech:  Normal Rate  Volume:  Decreased  Mood:  Anxious, Depressed, Dysphoric, Hopeless and Worthless  Affect:  Constricted and Depressed  Thought Process:  Goal Directed and Intact  Orientation:  Full (Time, Place, and Person)  Thought Content:  Obsessions and Rumination  Suicidal Thoughts:  Yes.  without intent/plan  Homicidal Thoughts:  No  Memory:  Immediate;   Fair  Judgement:  Impaired  Insight:  Lacking  Psychomotor Activity:  Psychomotor Retardation  Concentration:  Fair  Recall:  Fair  Akathisia:  NA  Handed:  Right  AIMS (if indicated):     Assets:  Communication Skills Desire for Improvement Physical Health Resilience Social Support Transportation  Sleep:  Number of Hours: 6.75   Current  Medications: Current Facility-Administered Medications  Medication Dose Route Frequency Provider Last Rate Last Dose  . alum & mag hydroxide-simeth (MAALOX/MYLANTA) 200-200-20 MG/5ML suspension 30 mL  30 mL Oral Q4H PRN Evanna Glenda Chroman, NP      . alum & mag hydroxide-simeth (MAALOX/MYLANTA) 200-200-20 MG/5ML suspension 30 mL  30 mL Oral Q4H PRN Delfin Gant, NP      . folic acid (FOLVITE) tablet 1 mg  1 mg Oral Daily Evanna Cori Greig Castilla, NP   1 mg at 06/25/13 0827  . hydrOXYzine (ATARAX/VISTARIL) tablet 25 mg  25 mg Oral Q6H PRN Malena Peer, NP   25 mg at 06/24/13 2029  . hydrOXYzine (ATARAX/VISTARIL) tablet 25 mg  25 mg Oral QHS PRN Delfin Gant, NP      . lisinopril (PRINIVIL,ZESTRIL) tablet 10 mg  10 mg Oral Daily Delfin Gant, NP   10 mg at 06/25/13 0826  . LORazepam (ATIVAN) tablet 1 mg  1 mg Oral Q6H PRN Evanna Glenda Chroman, NP       Or  . LORazepam (ATIVAN) injection 1 mg  1 mg Intravenous Q6H PRN Evanna Glenda Chroman, NP      . LORazepam (ATIVAN) tablet 1 mg  1 mg Oral Q8H PRN Malena Peer, NP   1 mg at 06/25/13 0826  . magnesium hydroxide (MILK OF MAGNESIA) suspension 30 mL  30 mL Oral Daily PRN Evanna Cori Burkett, NP      . magnesium hydroxide (MILK OF MAGNESIA) suspension 30 mL  30 mL Oral Daily PRN Delfin Gant, NP      .  multivitamin with minerals tablet 1 tablet  1 tablet Oral Daily Evanna Glenda Chroman, NP   1 tablet at 06/25/13 0827  . neomycin-bacitracin-polymyxin (NEOSPORIN) ointment   Topical TID PRN Durward Parcel, MD      . oxyCODONE (Oxy IR/ROXICODONE) immediate release tablet 5-10 mg  5-10 mg Oral Q6H PRN Malena Peer, NP   10 mg at 06/25/13 1256  . thiamine (VITAMIN B-1) tablet 100 mg  100 mg Oral Daily Evanna Glenda Chroman, NP   100 mg at 06/25/13 0827   Or  . thiamine (B-1) injection 100 mg  100 mg Intravenous Daily Evanna Glenda Chroman, NP      . traZODone (DESYREL) tablet 50 mg  50 mg Oral QHS PRN,MR X 1 Evanna  Glenda Chroman, NP   50 mg at 06/24/13 2204    Lab Results:  Results for orders placed during the hospital encounter of 06/23/13 (from the past 48 hour(s))  COMPREHENSIVE METABOLIC PANEL     Status: Abnormal   Collection Time    06/24/13  7:02 AM      Result Value Range   Sodium 139  135 - 145 mEq/L   Potassium 3.8  3.5 - 5.1 mEq/L   Chloride 102  96 - 112 mEq/L   CO2 30  19 - 32 mEq/L   Glucose, Bld 85  70 - 99 mg/dL   BUN 20  6 - 23 mg/dL   Creatinine, Ser 0.79  0.50 - 1.10 mg/dL   Calcium 9.0  8.4 - 10.5 mg/dL   Total Protein 6.3  6.0 - 8.3 g/dL   Albumin 2.9 (*) 3.5 - 5.2 g/dL   AST 9  0 - 37 U/L   ALT 6  0 - 35 U/L   Alkaline Phosphatase 55  39 - 117 U/L   Total Bilirubin 0.2 (*) 0.3 - 1.2 mg/dL   GFR calc non Af Amer >90  >90 mL/min   GFR calc Af Amer >90  >90 mL/min   Comment: (NOTE)     The eGFR has been calculated using the CKD EPI equation.     This calculation has not been validated in all clinical situations.     eGFR's persistently <90 mL/min signify possible Chronic Kidney     Disease.     Performed at [facility]    Physical Findings: AIMS: Facial and Oral Movements Muscles of Facial Expression: None, normal Lips and Perioral Area: None, normal Jaw: None, normal Tongue: None, normal,Extremity Movements Upper (arms, wrists, hands, fingers): None, normal Lower (legs, knees, ankles, toes): None, normal, Trunk Movements Neck, shoulders, hips: None, normal, Overall Severity Severity of abnormal movements (highest score from questions above): None, normal Incapacitation due to abnormal movements: None, normal Patient's awareness of abnormal movements (rate only patient's report): No Awareness, Dental Status Current problems with teeth and/or dentures?: Yes Does patient usually wear dentures?: Yes  CIWA:  CIWA-Ar Total: 4 COWS:  COWS Total Score: 3  Treatment Plan Summary: Daily contact with patient to assess and evaluate symptoms and  progress in treatment Medication management  Plan: Continue current medication and monitor for adverse effects Treatment Plan/Recommendations:   1. Admit for crisis management and stabilization. 2. Medication management to reduce current symptoms to base line and improve the patient's overall level of functioning. 3. Treat health problems as indicated. 4. Develop treatment plan to decrease risk of relapse upon discharge and to reduce the need for readmission. 5. Psycho-social education regarding relapse prevention and  self care. 6. Health care follow up as needed for medical problems. 7. Restart home medications where appropriate. 8. May discharge in 2-3 days  Medical Decision Making Problem Points:  Established problem, worsening (2), Review of last therapy session (1) and Review of psycho-social stressors (1) Data Points:  Review or order clinical lab tests (1) Review or order medicine tests (1) Review of medication regiment & side effects (2) Review of new medications or change in dosage (2)  I certify that inpatient services furnished can reasonably be expected to improve the patient's condition.   Deedra Pro,JANARDHAHA R. 06/25/2013, 2:06 PM

## 2013-06-25 NOTE — Progress Notes (Signed)
[name], female   DOB: 16-May-1961, 52 y.o.   MRN: [id number] D)  Resting quietly tonight without c/o's, eyes closed, resp reg, unlabored A)  Will continue to monitor for safety, continue POC R)  Safety maintained.

## 2013-06-25 NOTE — Progress Notes (Signed)
Adult Psychoeducational Group Note  Date:  06/25/2013 Time:  0900  Group Topic/Focus:  Spirituality:   The focus of this group is to discuss how one's spirituality can aide in recovery.  Participation Level:  Did Not Attend  Pt decided not to attend.  [name] Leanna Sato 06/25/2013, 12:19 PM

## 2013-06-25 NOTE — [facility] Group Notes (Signed)
[facility] Group Notes: (Clinical Social Work)   06/25/2013      Type of Therapy:  Group Therapy   Participation Level:  Did Not Attend    Selmer Dominion, LCSW 06/25/2013, 4:35 PM

## 2013-06-25 NOTE — Progress Notes (Addendum)
NUTRITION ASSESSMENT  Pt identified as at risk on the Malnutrition Screen Tool  INTERVENTION: 1. Educated patient on the importance of nutrition and encouraged intake of food and beverages. 2. Discussed weight goals. 3. Supplements: folic acid, MVI and thiamine daily.   Ensure Complete po BID, each supplement provides 350 kcal and 13 grams of protein.   NUTRITION DIAGNOSIS: Unintentional weight loss related to sub-optimal intake as evidenced by pt report.   Goal: Pt to meet >/= 90% of their estimated nutrition needs.  Monitor:  PO intake  Assessment:  Patient admitted for cocaine detox and depression.  Hx includes normocytic anemia RA, and cocaine vasculitis.  Patient is homeless and lives with a female friend, helping him with his ADL's.  This RD last saw patient during [facility] stay  8/11.  Patient reports good intake currently and prior to admit.  Recently lost the weight she had recently regained.  Reports eating breakfast, lunch and dinner at times.    Patient meets criteria for moderate malnutrition related to social/environmental causes AEB decreased body fat and muscle mass.  52 y.o. female  Height: Ht Readings from Last 1 Encounters:  06/23/13 5' 1.5" (1.562 m)    Weight: Wt Readings from Last 1 Encounters:  06/23/13 94 lb (42.638 kg)    Weight Hx: Wt Readings from Last 10 Encounters:  06/23/13 94 lb (42.638 kg)  06/19/13 92 lb 8 oz (41.958 kg)  06/17/13 102 lb (46.267 kg)  06/14/13 101 lb (45.813 kg)  05/14/13 94 lb (42.638 kg)  05/13/13 99 lb (44.906 kg)  04/18/13 100 lb (45.36 kg)  02/18/13 106 lb (48.081 kg)  12/28/12 106 lb (48.081 kg)  08/31/12 106 lb 9.6 oz (48.353 kg)    BMI:  Body mass index is 17.48 kg/(m^2). Pt meets criteria for uderweight based on current BMI.  Estimated Nutritional Needs: Kcal: 25-30 kcal/kg Protein: > 1 gram protein/kg Fluid: 1 ml/kcal  Diet Order: General Pt is also offered choice of unit snacks mid-morning and  mid-afternoon.  Pt is eating as desired.   Lab results and medications reviewed.   Antonieta Iba, RD, LDN Clinical Inpatient Dietitian Pager:  [phone] Weekend and after hours pager:  [phone]

## 2013-06-25 NOTE — Progress Notes (Signed)
Patient ID: BENA KOBEL, female   DOB: [birth year], 52 y.o.   MRN: [id number] D: Pt is awake and active on the unit this AM. Pt denies SI/HI and A/V hallucinations. Pt mood is depressed/anxious and her affect is tearful/sad. Pt's outer ears are peeling away scabs and exposing pink granulating tissue. There are 2 or three small spots of grey drainage on the surface but no obvious signs of infection. They are now too sensitive for non-adhesive dressings. Writer applied copious amounts of antibiotic ointment on the exposed tissue as a barrier and then draped 2X2 gauze over the auricles. Pt encouraged to wash hands frequently and to avoid touching them as much as possible. Ointment was also applied to tip of nose. Pt is isolative and not attending groups but she is cooperative with staff.   A: Encouraged pt to discuss feelings with staff and administered medication per MD orders. Writer also encouraged pt to participate in groups.  R: Pt is attending groups and tolerating medications well. Writer will continue to monitor. 15 minute checks are ongoing for safety.

## 2013-06-26 MED ORDER — CITALOPRAM HYDROBROMIDE 20 MG PO TABS
20.0000 mg | ORAL_TABLET | Freq: Every day | ORAL | Status: DC
  Administered 2013-06-26 – 2013-06-28 (×3): 20 mg via ORAL
  Filled 2013-06-26: qty 1
  Filled 2013-06-26: qty 3
  Filled 2013-06-26 (×4): qty 1

## 2013-06-26 NOTE — Progress Notes (Signed)
[facility] MD Progress Note  06/26/2013 4:50 PM [name]  MRN:  [id number]  Subjective: The patient is seen in her room during group time today. She states she doesn't feel up to going to group. She is emotionally unstable and has poor insight and when asked about her use of cocaine, she states nothing will help until she is good and ready to quit, and she doesn't know why. 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 She stated that she is getting better physically. She states again that she is ready to quit and is interested in going into a 30 day rehab upon discharge.  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 Followed by Dr. Louanne Skye   .  Hypertension    .  Cocaine abuse      ongoing with resultant vaculitis.   .  Tobacco abuse    .  Depression    .  Normocytic anemia      BL Hgb 9.8-12. Last anemia panel 04/2010 - showing Fe 19, ferritin 101. Pt on monthly B12 injections   .  Rheumatoid arthritis(714.0)      patient reported   .  Inflammatory arthritis     AXIS IV: economic problems, educational problems, occupational problems, other psychosocial or environmental problems, problems related to social environment and problems with primary support group  AXIS V: 41-50 serious symptoms ADL's:  Impaired  Sleep: Fair  Appetite:  Fair  Suicidal Ideation:  denies Homicidal Ideation:  denies AEB (as evidenced  by):  Psychiatric Specialty Exam: ROS  Blood pressure 119/68, pulse 98, temperature 99.5 F (37.5 C), temperature source Oral, resp. rate 18, height 5' 1.5" (1.562 m), weight 42.638 kg (94 lb).Body mass index is 17.48 kg/(m^2).  General Appearance: Bizarre  Eye Contact::  Minimal  Speech:  Clear and Coherent  Volume:  Normal  Mood:  Anxious and Hopeless  Affect:  Flat  Thought Process:  Goal Directed  Orientation:  Full (Time, Place, and Person)  Thought Content:  WDL  Suicidal Thoughts:  No  Homicidal Thoughts:  No  Memory:  Immediate;   Fair  Judgement:  Fair  Insight:  Lacking  Psychomotor Activity:  Normal  Concentration:  Fair  Recall:  Fair  Akathisia:  No  Handed:  Right  AIMS (if indicated):     Assets:  Resilience  Sleep:  Number of Hours: 5.75   Current Medications: Current Facility-Administered Medications  Medication Dose Route Frequency Provider Last Rate Last Dose  . alum & mag hydroxide-simeth (MAALOX/MYLANTA) 200-200-20 MG/5ML suspension 30 mL  30 mL Oral Q4H PRN Evanna Glenda Chroman, NP      . alum & mag hydroxide-simeth (MAALOX/MYLANTA) 200-200-20 MG/5ML suspension 30 mL  30 mL Oral Q4H PRN Delfin Gant, NP      . feeding supplement (ENSURE COMPLETE) liquid 237 mL  237 mL Oral BID BM Darrol Jump, RD   237 mL at 06/26/13 0832  . folic acid (FOLVITE)  tablet 1 mg  1 mg Oral Daily Evanna Cori Greig Castilla, NP   1 mg at 06/26/13 2563  . hydrOXYzine (ATARAX/VISTARIL) tablet 25 mg  25 mg Oral Q6H PRN Malena Peer, NP   25 mg at 06/25/13 1734  . hydrOXYzine (ATARAX/VISTARIL) tablet 25 mg  25 mg Oral QHS PRN Delfin Gant, NP      . lisinopril (PRINIVIL,ZESTRIL) tablet 10 mg  10 mg Oral Daily Delfin Gant, NP   10 mg at 06/26/13 0829  . LORazepam (ATIVAN) tablet 1 mg  1 mg Oral Q6H PRN Malena Peer, NP   1 mg at 06/25/13 1607   Or  . LORazepam (ATIVAN) injection 1 mg  1 mg Intravenous Q6H PRN Evanna Glenda Chroman, NP      . LORazepam  (ATIVAN) tablet 1 mg  1 mg Oral Q8H PRN Malena Peer, NP   1 mg at 06/25/13 1734  . magnesium hydroxide (MILK OF MAGNESIA) suspension 30 mL  30 mL Oral Daily PRN Evanna Glenda Chroman, NP      . magnesium hydroxide (MILK OF MAGNESIA) suspension 30 mL  30 mL Oral Daily PRN Delfin Gant, NP      . multivitamin with minerals tablet 1 tablet  1 tablet Oral Daily Evanna Glenda Chroman, NP   1 tablet at 06/26/13 0828  . neomycin-bacitracin-polymyxin (NEOSPORIN) ointment   Topical TID PRN Durward Parcel, MD      . oxyCODONE (Oxy IR/ROXICODONE) immediate release tablet 5-10 mg  5-10 mg Oral Q6H PRN Malena Peer, NP   10 mg at 06/26/13 1253  . sulfamethoxazole-trimethoprim (BACTRIM DS) 800-160 MG per tablet 1 tablet  1 tablet Oral Q12H Dara Hoyer, PA-C   1 tablet at 06/26/13 8937  . thiamine (VITAMIN B-1) tablet 100 mg  100 mg Oral Daily Evanna Cori Greig Castilla, NP   100 mg at 06/26/13 3428   Or  . thiamine (B-1) injection 100 mg  100 mg Intravenous Daily Evanna Glenda Chroman, NP      . traZODone (DESYREL) tablet 50 mg  50 mg Oral QHS PRN,MR X 1 Evanna Cori Greig Castilla, NP   50 mg at 06/25/13 2240    Lab Results: No results found for this or any previous visit (from the past 48 hour(s)).  Physical Findings: AIMS: Facial and Oral Movements Muscles of Facial Expression: None, normal Lips and Perioral Area: None, normal Jaw: None, normal Tongue: None, normal,Extremity Movements Upper (arms, wrists, hands, fingers): None, normal Lower (legs, knees, ankles, toes): None, normal, Trunk Movements Neck, shoulders, hips: None, normal, Overall Severity Severity of abnormal movements (highest score from questions above): None, normal Incapacitation due to abnormal movements: None, normal Patient's awareness of abnormal movements (rate only patient's report): No Awareness, Dental Status Current problems with teeth and/or dentures?: Yes Does patient usually wear dentures?: Yes  CIWA:   CIWA-Ar Total: 9 COWS:  COWS Total Score: 3  Treatment Plan Summary: Daily contact with patient to assess and evaluate symptoms and progress in treatment Medication management  Plan: 1. Continue crisis management and stabilization. 2. Medication management to reduce current symptoms to base line and improve patient's overall level of functioning 3. Treat health problems as indicated. 4. Develop treatment plan to decrease risk of relapse upon discharge and the need for readmission. 5. Psycho-social education regarding relapse prevention and self care. 6. Health care follow up as needed for medical problems. 7. Continue home medications where appropriate. 8. Recommend referral to long term  residential rehab upon discharge. ?[facility] in [place]. 9. ELOS: 3-4 days. 10. Will initiate Celexa 74m 1 po qd for depression.   Medical Decision Making Problem Points:  Established problem, stable/improving (1) Data Points:  Review or order medicine tests (1)  I certify that inpatient services furnished can reasonably be expected to improve the patient's condition.   NMarlane Hatcher Mashburn RPAC 4:50 PM 06/26/2013  Reviewed the information documented and agree with the treatment plan.  Amarianna Abplanalp,JANARDHAHA R. 06/26/2013 10:05 PM

## 2013-06-26 NOTE — Progress Notes (Signed)
Recreation Therapy Notes  Date: 09.22.2014 Time: 3:00pm Location: [address]   Group Topic: Wellness  Goal Area(s) Addresses:  Patient will define components of whole wellness. Patient will verbalize benefit of whole wellness.  Behavioral Response: Attentive, Appropriate  Intervention: Informational Worksheet  Activity: 6 Dimensions of Health. Patients were asked to identify at least 3 ways they are personally addressing the 6 dimensions of health: Physical, Emotional, Spiritual, Social, Environmental and Intellectual.   Education: Discharge Planning, Coping Skills  Education Outcome: Acknowledges understanding  Clinical Observations/Feedback: Patient made no contributions to opening discussion, however she appeared to actively listen, as she maintained appropriate eye contact with speaker. Patient completed worksheet as instructed. Patient contributed to wrap up discussion about the importance of each dimension to whole wellness.   Laureen Ochs Ieasha Boerema, LRT/CTRS  Lane Hacker 06/26/2013 4:42 PM

## 2013-06-26 NOTE — BHH Group Notes (Signed)
[facility] LCSW Aftercare Discharge Planning Group Note   06/26/2013 10:51 AM  Participation Quality:  Did not attend group.  Lyndi Holbein, Eulas Post

## 2013-06-26 NOTE — Progress Notes (Signed)
[name], female   DOB: 12/05/1960, 52 y.o.   MRN: [id number] Patient reports she slept fair and hr appetite is good. She rates her depression at 8/10 and hopelessness at 6/10.  She reports thoughts of self harm off an on.  She does contract for safety.  She was seen by the wound care nurse. Who ordered wound care of applying prescribed ointment bid.  Patient was able to verbalized instructions and RN is to give pat drop of ointment for patient to apply.  Reviewed importance of not touching ears or nose and handwashing.  Pt verbalizes understanding.  Clean pillowcases given to patient as well.

## 2013-06-26 NOTE — Progress Notes (Signed)
Adult Psychoeducational Group Note  Date:  06/26/2013 Time:  11:55 AM  Group Topic/Focus:  Developing a Wellness Toolbox:   The focus of this group is to help patients develop a "wellness toolbox" with skills and strategies to promote recovery upon discharge.  Participation Level:  Did Not Attend   Dionne Bucy 06/26/2013, 11:55 AM

## 2013-06-26 NOTE — Progress Notes (Signed)
D: Patient in her room on approach.  Patient states she had a good day but states she has pain in both ears.  Patient instructed not to pick at the wounds to her nose or ears.  Patient states she has been staying in her room and eating well.  Patient states, "I want to get better."  Patient states her appetite has picked up since she is no longer on the street.  Patient denies SI/HI and denies AVH. A: Staff to monitor Q 15 mins for safety.  Encouragement and support offered.  Scheduled medications administered per orders.  Oxycodone administered prn for ear pain.  Trazodone administered prn for sleep. R: Patient remains safe on the unit.  Patient did not attend group tonight.  Patient not visible on the unit.  Patient taking administered medications.

## 2013-06-26 NOTE — BHH Group Notes (Signed)
[facility] LCSW Group Therapy          Overcoming Obstacles       1:15 -2:30        06/26/2013   3:18 PM     Type of Therapy:  Group Therapy  Participation Level:  Patient did not attend group.  Concha Pyo 06/26/2013    3:18 PM

## 2013-06-26 NOTE — Progress Notes (Signed)
[name], female   DOB: [birth year], 52 y.o.   MRN: [id number] D- Patient has been sad and tearful off and on.  A-Talked with patient about her sadness.  R-She has been tearful and says she has to stop doing cocaine, but she has been doing it since she was 52 years old and she doesn't know if she can stop.  Says that she doesn't know what she will replace it with.  Says she does feel good about helping person she cares and cooks for and says it makes her feel goodto be helpful.  Says she sometimes hears voices that "says all kinds of things, but is not hearing them now.  Patient has been picking at her ears and nose less.  Have been reminding patient frequently to wash her hands.

## 2013-06-26 NOTE — Progress Notes (Signed)
Didn't attend group

## 2013-06-26 NOTE — Consult Note (Signed)
WOC consult Note Reason for Consult: Peeling areas on bilateral ears and tip of nose.  Our team had just seen on 06/14/13 and provided topical guidence and product Wound type:vasculitic changes Pressure Ulcer POA: No Measurement:left ear:  6cm x 0.2cm and right ear: 6cm x 0.4cm; tip of nose, 1.5cm x 1cm Wound VYX:AJLU moist due to petrolatum applied minutes ago.  Pink granulating tissue emerging beneath sloughing areas of dried serum (scabs)  Drainage (amount, consistency, odor) serosanguinous. Patent is picking at areas and did so while in shower causing some recent bleeding Periwound:intact Dressing procedure/placement/frequency:I have provided a tube of anasept gel-an antimicrobial, viscous gel that will stay where applied.  I will order for twice daily. [place] nursing team will not follow, but will remain available to this patient, her nursing and medical team.  Please re-consult if needed. Thanks, Maudie Flakes, MSN, RN, [place], [place], [place] [phone])

## 2013-06-26 NOTE — Progress Notes (Signed)
[name], female   DOB: [birth year], 52 y.o.   MRN: [id number] D)  Pt has been out and about on the [place] but didn't attend group.  Sat in the [place] outside the dayroom but was focused on her ears and nose, tearful at times.  Was medicated for pain, having serosanguinous drainage from ears, nose, peers reporting seeing pt wiping her nose and wiping the furniture.  Roommate reported asking housekeeping to change the shower curtain and clean the shower as there was bloody drainage on the shower curtain, black scabs and tissue on the floor of the shower.   Pt has been asked not to touch her ears or nose, but continues to do so, picking at them frequently.  PA made aware of situation, cultures obtained of rt ear and nose which is swollen and scabbed in her nares, tip of nose is necrotic.  Necrotic tissue on ears peeling off, showing pink tissue granulating in,  Some cartilage appears exposed.  Gauze dsgs with bactrim applied to ears,  bactrim then started. Roommate going to sleep in quiet room. A)  Will continue to monitor for safety, continue POC, wound consult request in place. R)  Safety maintained.

## 2013-06-27 NOTE — Progress Notes (Signed)
The focus of this group is to educate the patient on the purpose and policies of crisis stabilization and provide a format to answer questions about their admission.  The group details unit policies and expectations of patients while admitted.  Patient did not attend group.

## 2013-06-27 NOTE — Progress Notes (Signed)
Adult Psychoeducational Group Note  Date:  06/27/2013 Time:  10:55 PM  Group Topic/Focus:  Goals Group:   The focus of this group is to help patients establish daily goals to achieve during treatment and discuss how the patient can incorporate goal setting into their daily lives to aide in recovery.  Participation Level:  Active  Participation Quality:  Appropriate  Affect:  Appropriate  Cognitive:  Appropriate  Insight: Appropriate  Engagement in Group:  Engaged  Modes of Intervention:  Discussion  Additional Comments:  Pt stated that she didn't hurt today so that made her day.  [name] 06/27/2013, 10:55 PM

## 2013-06-27 NOTE — BHH Group Notes (Signed)
[place] LCSW Group Therapy  06/27/2013 3:26 PM  Type of Therapy:  Group Therapy  Participation Level:  Did Not Attend.  Patient stated her ears were hurting too bad to attend group. Concha Pyo 06/27/2013, 3:26 PM

## 2013-06-27 NOTE — Progress Notes (Signed)
D: Patient resting in bed with eyes closed.  Respirations even and unlabored.  Patient appears to be in no apparent distress. A: Staff to monitor Q 15 mins for safety.   R:Patient remains safe on the unit.  

## 2013-06-27 NOTE — Progress Notes (Signed)
[name], female   DOB: [birth year], 52 y.o.   MRN: [id number] [facility] MD Progress Note  06/27/2013 2:31 PM [name]  MRN:  [id number]  Subjective: She complaint of ear lobes and nose pain and been complaint with her medication and stated that she is feeling better and safe being in hospital. She also stated that she has decided not to abuse cocaine because it caused her several problems including her health. She rates her depression as 5/10 and anxiety 6/10. She has been choosing some groups and attending and she does not want to attend some times. She is emotionally unstable and has poor insight and when asked about her use of cocaine. 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 She states again that she is ready to quit and is interested in going into a 30 day rehab upon discharge.  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 Followed by Dr. Louanne Skye   .  Hypertension    .  Cocaine abuse      ongoing with resultant vaculitis.   .  Tobacco abuse    .  Depression    .  Normocytic anemia      BL Hgb 9.8-12. Last anemia panel 04/2010 - showing Fe 19, ferritin 101. Pt on monthly B12 injections   .  Rheumatoid arthritis(714.0)      patient reported   .  Inflammatory arthritis     AXIS IV: economic problems, educational problems, occupational problems, other psychosocial or environmental problems, problems  related to social environment and problems with primary support group  AXIS V: 41-50 serious symptoms ADL's:  Impaired  Sleep: Fair  Appetite:  Fair  Suicidal Ideation:  denies Homicidal Ideation:  denies AEB (as evidenced by):  Psychiatric Specialty Exam: ROS  Blood pressure 115/74, pulse 70, temperature 97.9 F (36.6 C), temperature source Oral, resp. rate 18, height 5' 1.5" (1.562 m), weight 42.638 kg (94 lb).Body mass index is 17.48 kg/(m^2).  General Appearance: Bizarre  Eye Contact::  Minimal  Speech:  Clear and Coherent  Volume:  Normal  Mood:  Anxious and Hopeless  Affect:  Flat  Thought Process:  Goal Directed  Orientation:  Full (Time, Place, and Person)  Thought Content:  WDL  Suicidal Thoughts:  No  Homicidal Thoughts:  No  Memory:  Immediate;   Fair  Judgement:  Fair  Insight:  Lacking  Psychomotor Activity:  Normal  Concentration:  Fair  Recall:  Fair  Akathisia:  No  Handed:  Right  AIMS (if indicated):     Assets:  Resilience  Sleep:  Number of Hours: 3.75   Current Medications: Current Facility-Administered Medications  Medication Dose Route Frequency Provider Last Rate Last Dose  . alum & mag hydroxide-simeth (MAALOX/MYLANTA) 200-200-20 MG/5ML suspension 30 mL  30 mL Oral Q4H PRN Evanna Glenda Chroman, NP      . alum & mag hydroxide-simeth (MAALOX/MYLANTA) 200-200-20 MG/5ML suspension 30 mL  30 mL  Oral Q4H PRN Delfin Gant, NP      . feeding supplement (ENSURE COMPLETE) liquid 237 mL  237 mL Oral BID BM Darrol Jump, RD   237 mL at 06/26/13 0832  . folic acid (FOLVITE) tablet 1 mg  1 mg Oral Daily Evanna Cori Greig Castilla, NP   1 mg at 06/26/13 1610  . hydrOXYzine (ATARAX/VISTARIL) tablet 25 mg  25 mg Oral Q6H PRN Malena Peer, NP   25 mg at 06/25/13 1734  . hydrOXYzine (ATARAX/VISTARIL) tablet 25 mg  25 mg Oral QHS PRN Delfin Gant, NP      . lisinopril (PRINIVIL,ZESTRIL) tablet 10 mg  10 mg Oral Daily Delfin Gant, NP   10 mg  at 06/26/13 0829  . LORazepam (ATIVAN) tablet 1 mg  1 mg Oral Q6H PRN Malena Peer, NP   1 mg at 06/25/13 1607   Or  . LORazepam (ATIVAN) injection 1 mg  1 mg Intravenous Q6H PRN Evanna Glenda Chroman, NP      . LORazepam (ATIVAN) tablet 1 mg  1 mg Oral Q8H PRN Malena Peer, NP   1 mg at 06/25/13 1734  . magnesium hydroxide (MILK OF MAGNESIA) suspension 30 mL  30 mL Oral Daily PRN Evanna Glenda Chroman, NP      . magnesium hydroxide (MILK OF MAGNESIA) suspension 30 mL  30 mL Oral Daily PRN Delfin Gant, NP      . multivitamin with minerals tablet 1 tablet  1 tablet Oral Daily Evanna Glenda Chroman, NP   1 tablet at 06/26/13 0828  . neomycin-bacitracin-polymyxin (NEOSPORIN) ointment   Topical TID PRN Durward Parcel, MD      . oxyCODONE (Oxy IR/ROXICODONE) immediate release tablet 5-10 mg  5-10 mg Oral Q6H PRN Malena Peer, NP   10 mg at 06/26/13 1253  . sulfamethoxazole-trimethoprim (BACTRIM DS) 800-160 MG per tablet 1 tablet  1 tablet Oral Q12H Dara Hoyer, PA-C   1 tablet at 06/26/13 9604  . thiamine (VITAMIN B-1) tablet 100 mg  100 mg Oral Daily Evanna Cori Greig Castilla, NP   100 mg at 06/26/13 5409   Or  . thiamine (B-1) injection 100 mg  100 mg Intravenous Daily Evanna Glenda Chroman, NP      . traZODone (DESYREL) tablet 50 mg  50 mg Oral QHS PRN,MR X 1 Evanna Cori Burkett, NP   50 mg at 06/25/13 2240    Lab Results:  Results for orders placed during the hospital encounter of 06/23/13 (from the past 48 hour(s))  WOUND CULTURE     Status: None   Collection Time    06/25/13 10:00 PM      Result Value Range   Specimen Description       Value: WOUND RT EAR     Performed at [place] Request[facility]       Value: Normal     Performed at [facility]   Gram Stain       Value: NO WBC SEEN     NO SQUAMOUS EPITHELIAL CELLS SEEN     RARE GRAM POSITIVE COCCI IN PAIRS     Performed at [facility]   Culture        Value: Culture reincubated for better growth     Performed at [facility]   Report Status PENDING    WOUND CULTURE     Status: None   Collection Time  06/25/13 11:09 PM      Result Value Range   Specimen Description       Value: WOUND TIP OF NOSE     Performed at [place] [facility]       Value: Normal     Performed at [facility]   Gram Stain       Value: NO WBC SEEN     NO SQUAMOUS EPITHELIAL CELLS SEEN     RARE GRAM POSITIVE COCCI IN PAIRS     Performed at [facility]   Culture       Value: Culture reincubated for better growth     Performed at [facility]   Report Status PENDING      Physical Findings: AIMS: Facial and Oral Movements Muscles of Facial Expression: None, normal Lips and Perioral Area: None, normal Jaw: None, normal Tongue: None, normal,Extremity Movements Upper (arms, wrists, hands, fingers): None, normal Lower (legs, knees, ankles, toes): None, normal, Trunk Movements Neck, shoulders, hips: None, normal, Overall Severity Severity of abnormal movements (highest score from questions above): None, normal Incapacitation due to abnormal movements: None, normal Patient's awareness of abnormal movements (rate only patient's report): No Awareness, Dental Status Current problems with teeth and/or dentures?: Yes Does patient usually wear dentures?: Yes  CIWA:  CIWA-Ar Total: 0 COWS:  COWS Total Score: 3  Treatment Plan Summary: Daily contact with patient to assess and evaluate symptoms and progress in treatment Medication management  Plan: 1. Continue crisis management and stabilization. 2. Medication management to reduce current symptoms to base line and improve patient's overall level of functioning; Continue Celexa 20 mg 1 po qd for depression. 3. Treat health problems as indicated. 4. Develop treatment plan to decrease risk of relapse upon discharge and the need for  readmission. 5. Psycho-social education regarding relapse prevention and self care. 6. Health care follow up as needed for medical problems. 7. Continue home medications where appropriate. 8. Recommend referral to long term residential rehab upon discharge. ?[facility] [place]. 9. ELOS: 2-3 days.   Medical Decision Making Problem Points:  Established problem, stable/improving (1) Data Points:  Review or order medicine tests (1)  I certify that inpatient services furnished can reasonably be expected to improve the patient's condition.   Elza Varricchio,JANARDHAHA R. 06/27/2013 2:31 PM

## 2013-06-27 NOTE — Progress Notes (Signed)
Recreation Therapy Notes  Date: 09.23.2014 Time: 2:45pm Location: [address]  Group Topic: Advice worker Activities (AAA)  Behavioral Response: Did not attend  [employer], LRT/CTRS  Laurencia Roma L 06/27/2013 9:05 PM

## 2013-06-27 NOTE — Progress Notes (Signed)
[name], female   DOB: [birth year], 52 y.o.   MRN: [id number] D- Patient reports she slept well and hr appetite is improving.  She had eggs, bacon and grits for breakfast and was proud to tell this.  Her energy level is normal and hr ability to pay attention is good.  She is rating her depression at 4/10 and denies feeling hopelessness.  She denies thoughts of self harm and and is asking when she will be discharged.  She attended a few groups today.  A- Talked with patient about good handwashing and not touching her ears.  R- Patient says that her ears hurt but they are not as itchy and she is not touching them as much.  She did shower this am and applied gel provided by wound care nurse with RN observing.  Patient says her R ear is more sore than L  ear.

## 2013-06-27 NOTE — Progress Notes (Signed)
Adult Psychoeducational Group Note  Date:  06/27/2013 Time:  11:00am Group Topic/Focus:  Recovery Goals:   The focus of this group is to identify appropriate goals for recovery and establish a plan to achieve them.  Participation Level:  Active  Participation Quality:  Appropriate and Attentive  Affect:  Appropriate  Cognitive:  Alert and Appropriate  Insight: Appropriate  Engagement in Group:  Engaged  Modes of Intervention:  Discussion and Education  Additional Comments:  Pt attended and participated in group. When ask what recovery means to her pt stated letting go of old habits and let God control her life. When ask what she could do to help herself pt stated stop hanging out with people that are pretending to be my friends.  Marlowe Shores D 06/27/2013, 1:44 PM

## 2013-06-27 NOTE — BHH Suicide Risk Assessment (Signed)
[place] INPATIENT:  Family/Significant Other Suicide Prevention Education  Suicide Prevention Education:  Patient Refusal for Family/Significant Other Suicide Prevention Education: The patient [name] has refused to provide written consent for family/significant other to be provided Family/Significant Other Suicide Prevention Education during admission and/or prior to discharge.  Physician notified.  Patient advised of not having a collateral contact.  Family not involved.  Concha Pyo 06/27/2013, 12:01 PM

## 2013-06-28 LAB — WOUND CULTURE
Gram Stain: NONE SEEN
Special Requests: NORMAL
Special Requests: NORMAL

## 2013-06-28 MED ORDER — TRAZODONE HCL 50 MG PO TABS
50.0000 mg | ORAL_TABLET | Freq: Every day | ORAL | Status: DC
  Filled 2013-06-28: qty 3

## 2013-06-28 MED ORDER — OXYCODONE HCL 5 MG PO TABS: 5.0000 mg | ORAL_TABLET | Freq: Four times a day (QID) | ORAL | Status: DC | PRN

## 2013-06-28 MED ORDER — SULFAMETHOXAZOLE-TMP DS 800-160 MG PO TABS: 1.0000 | ORAL_TABLET | Freq: Two times a day (BID) | ORAL | Status: DC

## 2013-06-28 MED ORDER — ASPIRIN EC 81 MG PO TBEC: 243.0000 mg | DELAYED_RELEASE_TABLET | Freq: Once | ORAL | Status: DC

## 2013-06-28 MED ORDER — LISINOPRIL 10 MG PO TABS: 10.0000 mg | ORAL_TABLET | Freq: Every day | ORAL | Status: DC

## 2013-06-28 MED ORDER — CITALOPRAM HYDROBROMIDE 20 MG PO TABS: 20.0000 mg | ORAL_TABLET | Freq: Every day | ORAL | Status: DC

## 2013-06-28 MED ORDER — TRAZODONE HCL 50 MG PO TABS: ORAL_TABLET | ORAL | Status: DC

## 2013-06-28 MED ORDER — FOLIC ACID 1 MG PO TABS: 1.0000 mg | ORAL_TABLET | Freq: Every day | ORAL | Status: DC

## 2013-06-28 MED ORDER — BACITRACIN-NEOMYCIN-POLYMYXIN OINTMENT TUBE: 1.0000 "application " | TOPICAL_OINTMENT | Freq: Three times a day (TID) | CUTANEOUS | Status: DC | PRN

## 2013-06-28 NOTE — Progress Notes (Signed)
[facility] Adult Case Management Discharge Plan :  Will you be returning to the same living situation after discharge: Yes,  Friend's house At discharge, do you have transportation home?:Yes,  bus pass Do you have the ability to pay for your medications:Yes,  medicaid  Release of information consent forms completed and in the chart;  Patient's signature needed at discharge.  Patient to Follow up at: Follow-up Information   Follow up with [facility] On 06/30/2013. (Please go to [facility]'s walk in clinic on Friday, June 30, 2013 or any weekday between iAM - 3PM for medication management and counseling)    Contact information:   [address] [place], [place]   [address]  [phone]      Patient denies SI/HI:   Yes,  during group/self report.    Safety Planning and Suicide Prevention discussed:  Yes,  Pt did not provide any family/friend information for collateral contacts. SPE completed with pt.   Smart, Deanie [place] 06/28/2013, 12:24 PM

## 2013-06-28 NOTE — Progress Notes (Signed)
Adult Psychoeducational Group Note  Date:  06/28/2013 Time:  10:00 11:00am groups Group Topic/Focus:  Personal Choices and Values:   The focus of this group is to help patients assess and explore the importance of values in their lives, how their values affect their decisions, how they express their values and what opposes their expression.  Participation Level:  Did Not Attend  Participation Quality:    Affect:    Cognitive:   Insight:  Engagement in Group:  Modes of Intervention:    Additional Comments:  Pt did not attend groups.  Marlowe Shores D 06/28/2013, 11:53 AM

## 2013-06-28 NOTE — BHH Suicide Risk Assessment (Signed)
Suicide Risk Assessment  Discharge Assessment     Demographic Factors:  Adolescent or young adult, Low socioeconomic status, Living alone and Unemployed  Mental Status Per Nursing Assessment::   On Admission:  Self-harm thoughts  Current Mental Status by Physician: Mental Status Examination: Patient appeared as per his stated age, casually dressed, and fairly groomed, and maintaining good eye contact. Patient has good mood and his affect was constricted. He has normal rate, rhythm, and volume of speech. His thought process is linear and goal directed. Patient has denied suicidal, homicidal ideations, intentions or plans. Patient has no evidence of auditory or visual hallucinations, delusions, and paranoia. Patient has fair insight judgment and impulse control.  Loss Factors: Decrease in vocational status, Decline in physical health and Financial problems/change in socioeconomic status  Historical Factors: Prior suicide attempts, Family history of mental illness or substance abuse and Impulsivity  Risk Reduction Factors:   Religious beliefs about death, Living with another person, especially a relative and Positive social support  Continued Clinical Symptoms:  Depression:   Impulsivity Recent sense of peace/wellbeing Alcohol/Substance Abuse/Dependencies  Cognitive Features That Contribute To Risk:  Polarized thinking    Suicide Risk:  Minimal: No identifiable suicidal ideation.  Patients presenting with no risk factors but with morbid ruminations; may be classified as minimal risk based on the severity of the depressive symptoms  Discharge Diagnoses:   AXIS I:  Major Depression, Recurrent severe, Substance Induced Mood Disorder and Cocaine abuse versus dependence AXIS II:  Deferred AXIS III:   Past Medical History  Diagnosis Date  . 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 Followed by Dr. Louanne Skye  . Hypertension   . Cocaine abuse     ongoing with resultant vaculitis.  .  Tobacco abuse   . Depression   . Normocytic anemia     BL Hgb 9.8-12. Last anemia panel 04/2010 - showing Fe 19, ferritin 101.  Pt on monthly B12 injections  . Rheumatoid arthritis(714.0)     patient reported  . Inflammatory arthritis    AXIS IV:  economic problems, occupational problems, other psychosocial or environmental problems, problems related to social environment and problems with primary support group AXIS V:  61-70 mild symptoms  Plan Of Care/Follow-up recommendations:  Activity:  As tolerated Diet:  Regular  Is patient on multiple antipsychotic therapies at discharge:  No   Has Patient had three or more failed trials of antipsychotic monotherapy by history:  No  Recommended Plan for Multiple Antipsychotic Therapies: NA  Janayia Burggraf,JANARDHAHA R. 06/28/2013, 12:45 PM

## 2013-06-28 NOTE — BHH Group Notes (Signed)
[facility] LCSW Group Therapy  06/28/2013  1:15 PM   Type of Therapy:  Group Therapy  Participation Level:  Did Not Attend  [name] 06/28/2013 2:33 PM

## 2013-06-28 NOTE — BHH Group Notes (Signed)
[facility] LCSW Aftercare Discharge Planning Group Note   06/28/2013 8:45 AM  Participation Quality:  Alert and Appropriate   Mood/Affect:  Appropriate and Calm  Depression Rating:  5  Anxiety Rating:  5  Thoughts of Suicide:  Pt denies SI/HI  Will you contract for safety?   Yes  Current AVH:  Pt denies  Plan for Discharge/Comments:  Pt attended discharge planning group and actively participated in group.  CSW provided pt with today's workbook.  Pt states that she will return home to a friend's house in [place].  Pt will follow up at [facility] for medication management and therapy.  No further needs voiced by pt at this time.    Transportation Means: Pt reports access to transportation - provided pt with a bus pass  Supports: No supports mentioned at this time  [name], [place] 06/28/2013 10:21 AM

## 2013-06-28 NOTE — Tx Team (Signed)
Interdisciplinary Treatment Plan Update (Adult)  Date: 06/28/2013   Time Reviewed: 11:53 AM  Progress in Treatment:  Attending groups:  Yes Participating in groups:  Yes Taking medication as prescribed: Yes  Tolerating medication: Yes  Family/Significant othe contact made: No, pt did not have any collateral contacts. SPE completed with pt.  Patient understands diagnosis: Yes, AEB seeking treatment for depression and SI. Discussing patient identified problems/goals with staff: Yes  Medical problems stabilized or resolved: Yes  Denies suicidal/homicidal ideation: Yes  Patient has not harmed self or Others: Yes  New problem(s) identified:  Discharge Plan or Barriers: Pt to follow up at [facility] for med management and therapy.  Additional comments: Patient admitted voluntarily and emergently from [facility] ER seeking treatment for depression and suicidal ideations. She has history of Cocaine dependence and depression. 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 Patient stated that she is burning inside to out side and she does endorsing suicide and stated she is tired of using cocaine. She has not been compliant with her Prozac but uses cocaine on a daily basis. She also came in for bilteral ear pain, cheek and nose steming from Cocaine Vasculitis. Patient states this am she is tired of using cocaine and will like to stop. Patient is homeless and lives with a female friend helping him with his ADLS. She reported occasionally hearing voices especially when she is angry.  Reason for Continuation of Hospitalization: d/c today Estimated length of stay: d/c today For review of initial/current patient goals, please see plan of care.  Attendees:     Dr. Zorita Pang MD 06/28/2013 12:20 PM   Ronecia RN 06/28/2013 12:20 PM  Chelsea Horton [place] 06/28/2013 12:20 PM   Clinton Sawyer RN 06/28/2013 12:21 PM   Gerline Legacy Nurse CM  06/28/2013 12:21 PM   [place] Coordination 06/28/2013 12:21 PM          Scribe for Treatment Team:  Nira Conn Smart LCSWA  06/28/2013 12:21 PM
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
## 2013-06-28 NOTE — Progress Notes (Signed)
Pt discharged per MD orders; pt currently denies SI/HI and auditory/visual hallucinations; pt was given education by RN regarding follow-up appointments and medications and pt denied any questions or concerns about these instructions; pt was then escorted to search room to retrieve her belongings by RN before being discharged to hospital lobby.

## 2013-06-29 NOTE — Consult Note (Signed)
Note reviewed and agreed with

## 2013-07-03 NOTE — Progress Notes (Signed)
Patient Discharge Instructions:  After Visit Summary (AVS):   Faxed to:  07/03/13 Discharge Summary Note:   Faxed to:  07/03/13 Psychiatric Admission Assessment Note:   Faxed to:  07/03/13 Suicide Risk Assessment - Discharge Assessment:   Faxed to:  07/03/13 Faxed/Sent to the Next Level Care provider:  07/03/13 Faxed to [facility] [place], 07/03/2013, 2:44 PM

## 2013-07-11 IMAGING — CR DG CHEST 2V
2 series · 2 of 2 positions shown · non-contrast
Comparison: Chest radiograph and CTA of the chest performed
06/11/2011

CLINICAL DATA: Cough, shortness of breath and fever.

CHEST - 2 VIEW

[w chest pa]
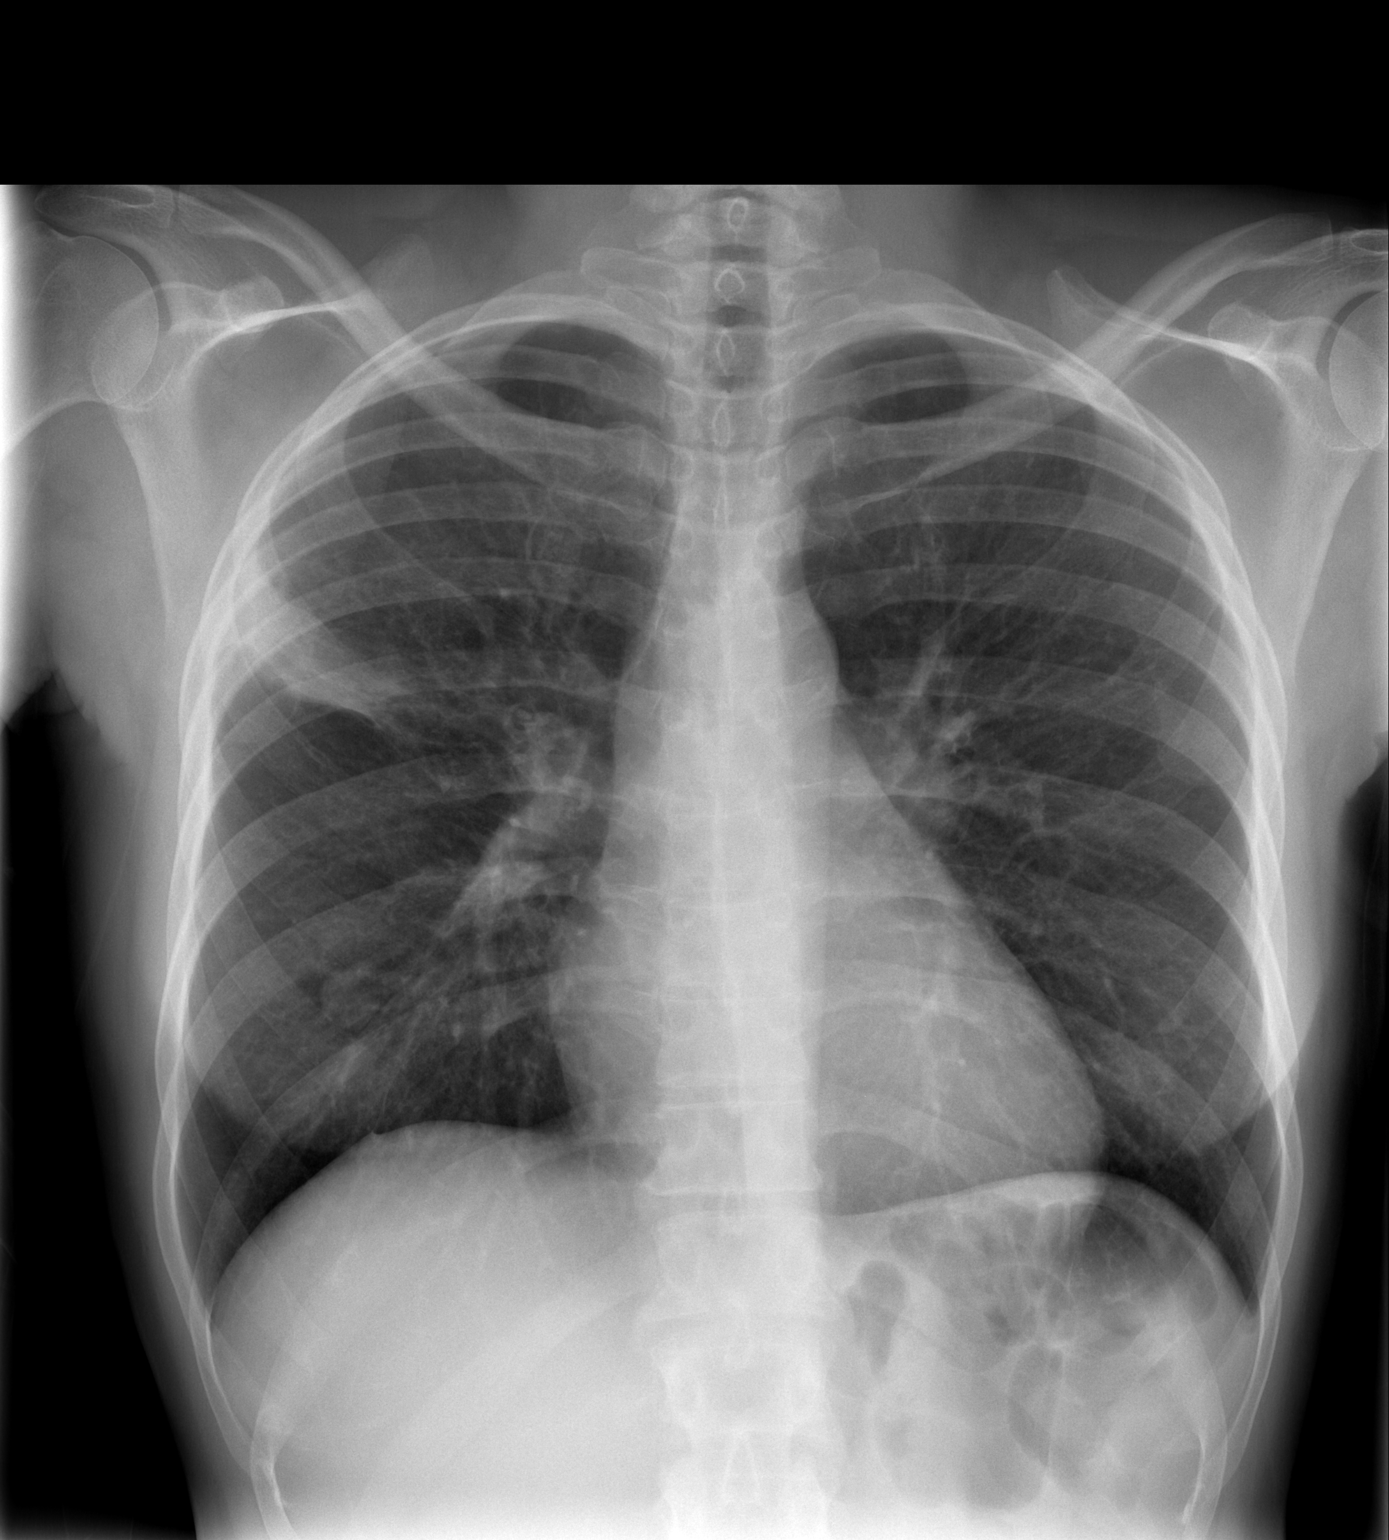

[w chest lat]
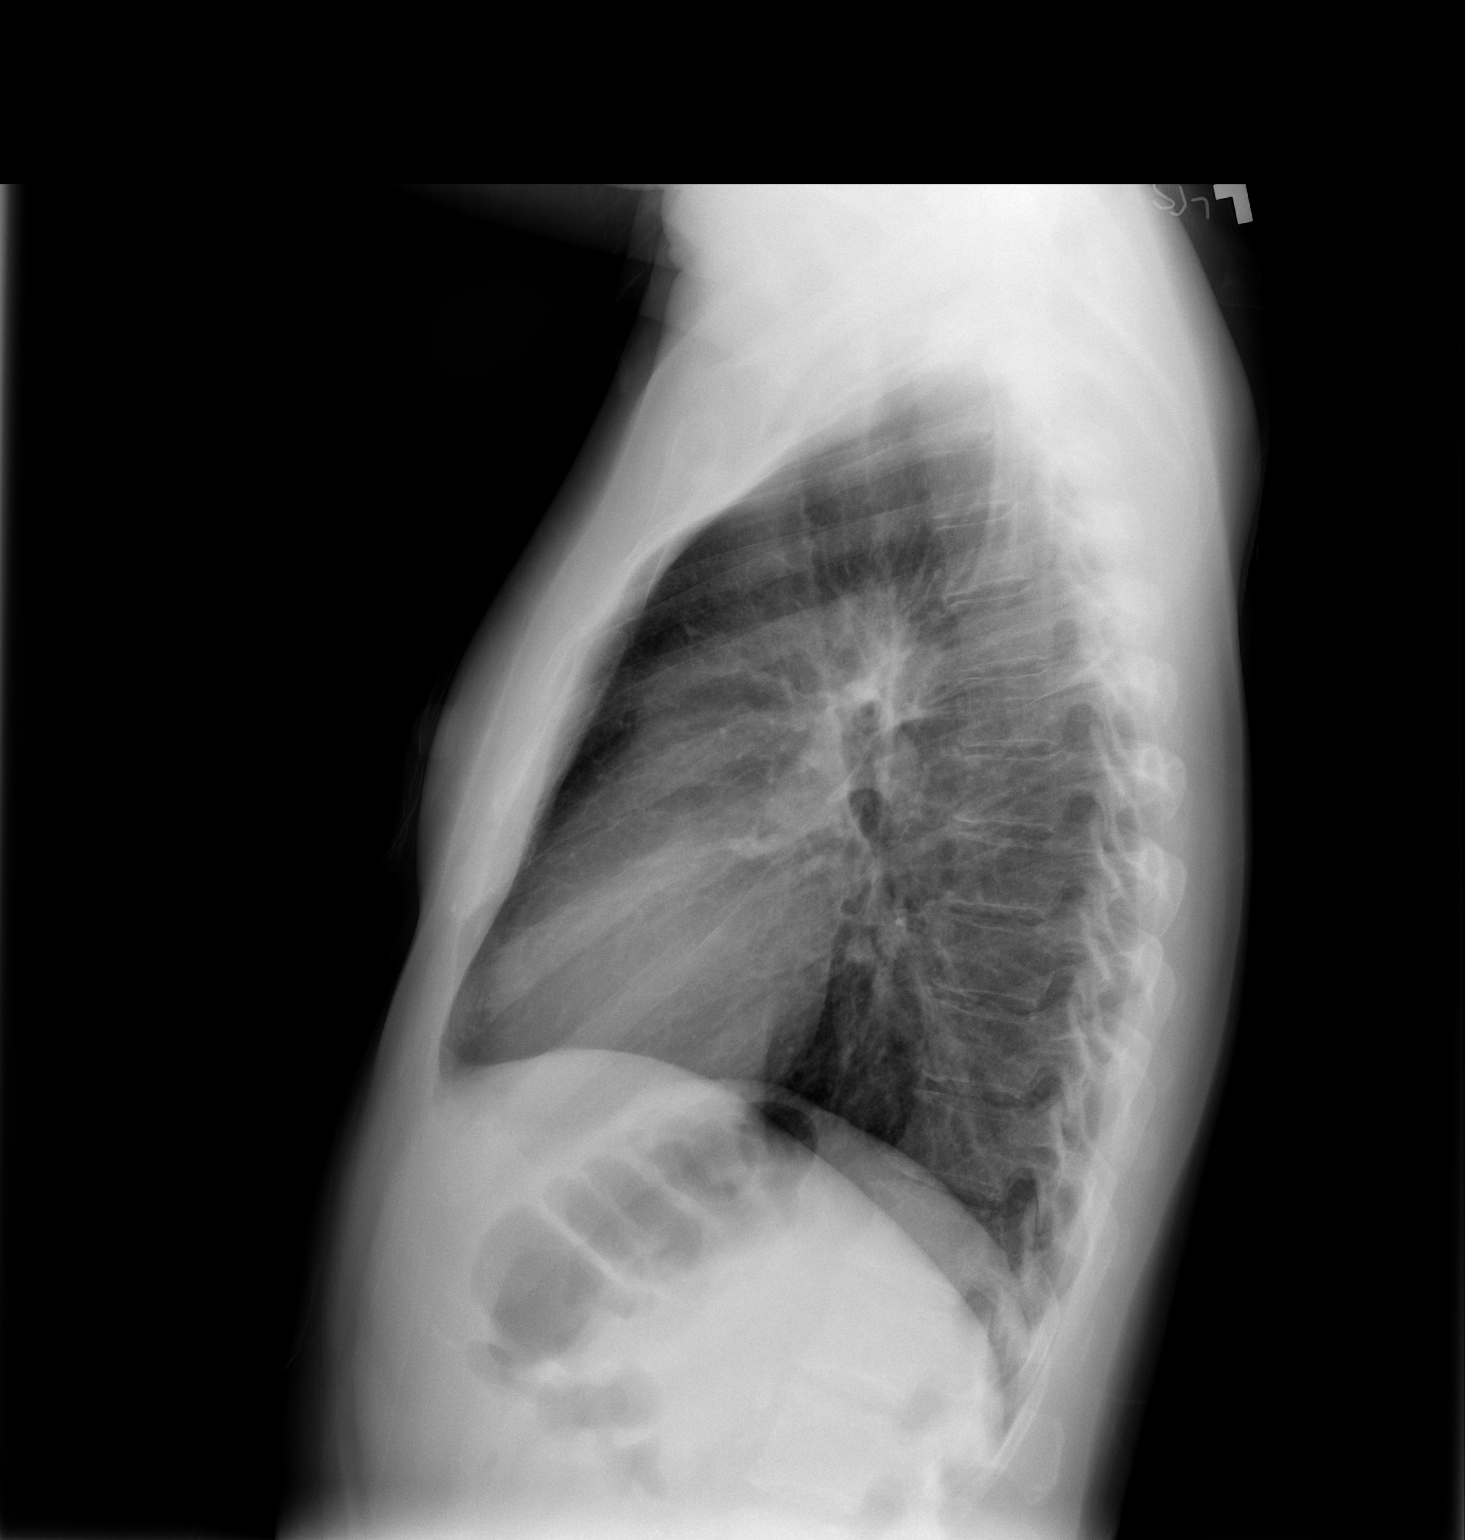

[2 of 2 positions shown; findings below may reference images not displayed]

FINDINGS: The lungs are well-aerated.  There is persistent dense
airspace opacification at the peripheral aspect of the right upper
lobe, improved from prior studies but raising concern for recurrent
or persistent pneumonia.  There is no evidence of pleural effusion
or pneumothorax.

The heart is normal in size; the mediastinal contour is within
normal limits.  No acute osseous abnormalities are seen.
IMPRESSION: Persistent dense airspace opacification at the peripheral aspect of
the right upper lobe, improved from prior studies but raising
concern for recurrent or persistent pneumonia.

## 2013-07-29 ENCOUNTER — Encounter (HOSPITAL_COMMUNITY): Payer: Self-pay | Admitting: Emergency Medicine

## 2013-07-29 ENCOUNTER — Emergency Department (HOSPITAL_COMMUNITY)
Admission: EM | Admit: 2013-07-29 | Discharge: 2013-07-30 | Disposition: A | Discharge status: Home / Self Care | Payer: Medicaid Other | Attending: Emergency Medicine | Admitting: Emergency Medicine

## 2013-07-29 DIAGNOSIS — Z79899 Other long term (current) drug therapy: Secondary | ICD-10-CM | POA: Insufficient documentation

## 2013-07-29 DIAGNOSIS — M064 Inflammatory polyarthropathy: Secondary | ICD-10-CM | POA: Insufficient documentation

## 2013-07-29 DIAGNOSIS — I776 Arteritis, unspecified: Secondary | ICD-10-CM | POA: Insufficient documentation

## 2013-07-29 DIAGNOSIS — I10 Essential (primary) hypertension: Secondary | ICD-10-CM | POA: Insufficient documentation

## 2013-07-29 DIAGNOSIS — M069 Rheumatoid arthritis, unspecified: Secondary | ICD-10-CM | POA: Insufficient documentation

## 2013-07-29 DIAGNOSIS — D649 Anemia, unspecified: Secondary | ICD-10-CM | POA: Insufficient documentation

## 2013-07-29 DIAGNOSIS — F17200 Nicotine dependence, unspecified, uncomplicated: Secondary | ICD-10-CM | POA: Insufficient documentation

## 2013-07-29 DIAGNOSIS — L959 Vasculitis limited to the skin, unspecified: Secondary | ICD-10-CM

## 2013-07-29 DIAGNOSIS — F3289 Other specified depressive episodes: Secondary | ICD-10-CM | POA: Insufficient documentation

## 2013-07-29 DIAGNOSIS — F329 Major depressive disorder, single episode, unspecified: Secondary | ICD-10-CM | POA: Insufficient documentation

## 2013-07-29 NOTE — ED Notes (Signed)
Pt is homeless and has facial pain and swelling to both cheeks, nose is blistered with swelling. Pt also has open wounds and swelling with bloody drainage to both ears.

## 2013-07-29 NOTE — ED Notes (Signed)
Bed: BM18 Expected date:  Expected time:  Means of arrival:  Comments: Bed 10, EMS, 52 F, Swelling to cheek

## 2013-07-30 LAB — PROTIME-INR
INR: 1.07 (ref 0.00–1.49)
Prothrombin Time: 13.7 seconds (ref 11.6–15.2)

## 2013-07-30 LAB — CBC WITH DIFFERENTIAL/PLATELET
Basophils Absolute: 0 10*3/uL (ref 0.0–0.1)
Eosinophils Relative: 1 % (ref 0–5)
HCT: 32.9 % — ABNORMAL LOW (ref 36.0–46.0)
Hemoglobin: 11.7 g/dL — ABNORMAL LOW (ref 12.0–15.0)
Lymphocytes Relative: 30 % (ref 12–46)
Lymphs Abs: 1.6 10*3/uL (ref 0.7–4.0)
MCV: 83.7 fL (ref 78.0–100.0)
Monocytes Absolute: 0.1 10*3/uL (ref 0.1–1.0)
Monocytes Relative: 2 % — ABNORMAL LOW (ref 3–12)
Neutro Abs: 3.5 10*3/uL (ref 1.7–7.7)
Neutrophils Relative %: 67 % (ref 43–77)
RBC: 3.93 MIL/uL (ref 3.87–5.11)
RDW: 13.8 % (ref 11.5–15.5)
WBC: 5.2 10*3/uL (ref 4.0–10.5)

## 2013-07-30 LAB — COMPREHENSIVE METABOLIC PANEL
AST: 22 U/L (ref 0–37)
Alkaline Phosphatase: 77 U/L (ref 39–117)
BUN: 20 mg/dL (ref 6–23)
CO2: 23 mEq/L (ref 19–32)
Chloride: 94 mEq/L — ABNORMAL LOW (ref 96–112)
Creatinine, Ser: 0.89 mg/dL (ref 0.50–1.10)
GFR calc non Af Amer: 74 mL/min — ABNORMAL LOW (ref >90)
Potassium: 3.4 mEq/L — ABNORMAL LOW (ref 3.5–5.1)
Total Bilirubin: 0.4 mg/dL (ref 0.3–1.2)

## 2013-07-30 MED ORDER — DEXAMETHASONE SODIUM PHOSPHATE 10 MG/ML IJ SOLN
10.0000 mg | Freq: Once | INTRAMUSCULAR | Status: AC
  Administered 2013-07-30: 10 mg via INTRAVENOUS
  Filled 2013-07-30: qty 1

## 2013-07-30 MED ORDER — OXYCODONE HCL 5 MG PO TABS
5.0000 mg | ORAL_TABLET | Freq: Once | ORAL | Status: AC
  Administered 2013-07-30: 5 mg via ORAL
  Filled 2013-07-30: qty 1

## 2013-07-30 MED ORDER — BACITRACIN-NEOMYCIN-POLYMYXIN OINTMENT TUBE: 1.0000 "application " | TOPICAL_OINTMENT | Freq: Two times a day (BID) | CUTANEOUS | Status: DC

## 2013-07-30 MED ORDER — MUPIROCIN 2 % EX OINT
TOPICAL_OINTMENT | Freq: Once | CUTANEOUS | Status: AC
  Administered 2013-07-30: 06:00:00 via NASAL
  Filled 2013-07-30: qty 22

## 2013-07-30 MED ORDER — HYDROMORPHONE HCL PF 1 MG/ML IJ SOLN
1.0000 mg | INTRAMUSCULAR | Status: AC
  Administered 2013-07-30 (×2): 1 mg via INTRAVENOUS
  Filled 2013-07-30 (×2): qty 1

## 2013-07-30 MED ORDER — HYDROMORPHONE HCL PF 1 MG/ML IJ SOLN
1.0000 mg | Freq: Once | INTRAMUSCULAR | Status: AC
  Administered 2013-07-30: 1 mg via INTRAVENOUS
  Filled 2013-07-30: qty 1

## 2013-07-30 MED ORDER — ONDANSETRON HCL 4 MG/2ML IJ SOLN
4.0000 mg | Freq: Once | INTRAMUSCULAR | Status: AC
  Administered 2013-07-30: 4 mg via INTRAVENOUS
  Filled 2013-07-30: qty 2

## 2013-07-30 MED ORDER — PREDNISONE 10 MG PO TABS: 20.0000 mg | ORAL_TABLET | Freq: Every day | ORAL | Status: DC

## 2013-07-30 MED ORDER — SODIUM CHLORIDE 0.9 % IV SOLN
Freq: Once | INTRAVENOUS | Status: AC
  Administered 2013-07-30: 01:00:00 via INTRAVENOUS

## 2013-07-30 MED ORDER — SODIUM CHLORIDE 0.9 % IV BOLUS (SEPSIS)
1000.0000 mL | Freq: Once | INTRAVENOUS | Status: AC
  Administered 2013-07-30: 1000 mL via INTRAVENOUS

## 2013-07-30 MED ORDER — MUPIROCIN CALCIUM 2 % EX CREA
TOPICAL_CREAM | Freq: Once | CUTANEOUS | Status: DC
  Filled 2013-07-30: qty 15

## 2013-07-30 MED ORDER — OXYCODONE HCL 5 MG PO TABS: 5.0000 mg | ORAL_TABLET | Freq: Four times a day (QID) | ORAL | Status: DC | PRN

## 2013-07-30 MED ORDER — CLINDAMYCIN PHOSPHATE 900 MG/50ML IV SOLN
900.0000 mg | INTRAVENOUS | Status: AC
  Administered 2013-07-30: 900 mg via INTRAVENOUS
  Filled 2013-07-30 (×2): qty 50

## 2013-07-30 NOTE — ED Provider Notes (Signed)
CSN: [id number]     Arrival date & time 07/29/13  2351 History   First MD Initiated Contact with Patient 07/30/13 [phone]     Chief Complaint  Patient presents with  . Facial Swelling   (Consider location/radiation/quality/duration/timing/severity/associated sxs/prior Treatment) HPI Comments: Patient with a history of vasculitis.  Presents tonight after cocaine use with increased facial pain, and pain to her ears.  She, states she's not had any of her medication and "a while."  The history is provided by the patient.    Past Medical History  Diagnosis Date  . 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 Followed by Dr. Louanne Skye  . Hypertension   . Cocaine abuse     ongoing with resultant vaculitis.  . Tobacco abuse   . Depression   . Normocytic anemia     BL Hgb 9.8-12. Last anemia panel 04/2010 - showing Fe 19, ferritin 101.  Pt on monthly B12 injections  . Rheumatoid arthritis(714.0)     patient reported  . Inflammatory arthritis    Past Surgical History  Procedure Laterality Date  . Skin biopsy      bilateral shin nodules on 04/2010  . Hernia repair     Family History  Problem Relation Age of Onset  . Breast cancer Mother     Breast cancer  . Alcohol abuse Mother   . Colon cancer Maternal Aunt 39  . Alcohol abuse Father    History  Substance Use Topics  . Smoking status: Current Every Day Smoker -- 0.50 packs/day for 39 years    Types: Cigarettes  . Smokeless tobacco: Never Used     Comment: trying  . Alcohol Use: No   OB History   Grav Para Term Preterm Abortions TAB SAB Ect Mult Living                 Review of Systems  Constitutional: Negative for fever and chills.  HENT: Positive for ear pain.   Cardiovascular: Negative for chest pain.  Skin: Positive for wound.  All other systems reviewed and are negative.    Allergies  Acetaminophen  Home Medications   Current Outpatient Rx  Name  Route  Sig  Dispense  Refill  . aspirin EC 325 MG tablet   Oral  Take 650 mg by mouth every 6 (six) hours as needed for pain.         . citalopram (CELEXA) 20 MG tablet   Oral   Take 1 tablet (20 mg total) by mouth daily. For depression and anxiety.   30 tablet   0   . folic acid (FOLVITE) 1 MG tablet   Oral   Take 1 tablet (1 mg total) by mouth daily. For low folic acid.   100 tablet   0   . lisinopril (PRINIVIL,ZESTRIL) 10 MG tablet   Oral   Take 1 tablet (10 mg total) by mouth daily. For hypertension.   30 tablet   0   . traZODone (DESYREL) 50 MG tablet      Take one tablet at bedtime for insomnia.   15 tablet   0   . neomycin-bacitracin-polymyxin (NEOSPORIN) OINT   Topical   Apply 1 application topically 2 (two) times daily. For wounds to face, lips and ears.   3.75 Tube   0   . oxyCODONE (OXY IR/ROXICODONE) 5 MG immediate release tablet   Oral   Take 1 tablet (5 mg total) by mouth every 6 (six) hours as needed.  For pain.   15 tablet   0   . predniSONE (DELTASONE) 10 MG tablet   Oral   Take 2 tablets (20 mg total) by mouth daily.   14 tablet   0   . sulfamethoxazole-trimethoprim (BACTRIM DS) 800-160 MG per tablet   Oral   Take 1 tablet by mouth every 12 (twelve) hours. For skin infection.          BP 185/98  Pulse 109  Temp(Src) 98.7 F (37.1 C) (Oral)  Resp 20  SpO2 99% Physical Exam  Nursing note and vitals reviewed. Constitutional: She appears well-developed and well-nourished.  HENT:  Head: Normocephalic and atraumatic.  Eyes: Pupils are equal, round, and reactive to light.  Neck: Normal range of motion.  Cardiovascular: Regular rhythm.   Pulmonary/Chest: Effort normal and breath sounds normal.  Musculoskeletal: She exhibits no edema and no tenderness.  Neurological: She is alert.  Skin: There is erythema.  Patient with vasculitis, 2 the edges of her ears.  Tip of her nose, and both facial cheeks.    ED Course  Procedures (including critical care time) Labs Review Labs Reviewed  CBC WITH  DIFFERENTIAL - Abnormal; Notable for the following:    Hemoglobin 11.7 (*)    HCT 32.9 (*)    Monocytes Relative 2 (*)    All other components within normal limits  COMPREHENSIVE METABOLIC PANEL - Abnormal; Notable for the following:    Sodium 129 (*)    Potassium 3.4 (*)    Chloride 94 (*)    Glucose, Bld 120 (*)    GFR calc non Af Amer 74 (*)    GFR calc Af Amer 86 (*)    All other components within normal limits  PROTIME-INR   Imaging Review No results found.  EKG Interpretation   None       MDM   1. Vasculitis limited to skin     Patient was made comfortable she is switched to by mouth oxycodone  Bactroban topical, and steroid.  She is instructed to follow up with her primary care physician    Garald Balding, NP 07/30/13 [phone]

## 2013-07-31 NOTE — ED Provider Notes (Signed)
Medical screening examination/treatment/procedure(s) were performed by non-physician practitioner and as supervising physician I was immediately available for consultation/collaboration.   Teressa Lower, MD 07/31/13 1102

## 2013-08-30 ENCOUNTER — Inpatient Hospital Stay (HOSPITAL_COMMUNITY)
Admission: AD | Admit: 2013-08-30 | Discharge: 2013-09-05 | DRG: 885 | Disposition: A | Discharge status: Home / Self Care | Payer: Federal, State, Local not specified - Other | Source: Intra-hospital | Attending: Psychiatry | Admitting: Psychiatry

## 2013-08-30 ENCOUNTER — Encounter (HOSPITAL_COMMUNITY): Payer: Self-pay | Admitting: *Deleted

## 2013-08-30 ENCOUNTER — Emergency Department (HOSPITAL_COMMUNITY)
Admission: EM | Admit: 2013-08-30 | Discharge: 2013-08-30 | Disposition: A | Discharge status: Other Acute Inpatient Hospital | Payer: Medicaid Other | Attending: Emergency Medicine | Admitting: Emergency Medicine

## 2013-08-30 ENCOUNTER — Encounter (HOSPITAL_COMMUNITY): Payer: Self-pay | Admitting: Emergency Medicine

## 2013-08-30 DIAGNOSIS — M79642 Pain in left hand: Secondary | ICD-10-CM

## 2013-08-30 DIAGNOSIS — F1410 Cocaine abuse, uncomplicated: Secondary | ICD-10-CM | POA: Insufficient documentation

## 2013-08-30 DIAGNOSIS — Z5900 Homelessness unspecified: Secondary | ICD-10-CM

## 2013-08-30 DIAGNOSIS — F19959 Other psychoactive substance use, unspecified with psychoactive substance-induced psychotic disorder, unspecified: Secondary | ICD-10-CM

## 2013-08-30 DIAGNOSIS — Z79899 Other long term (current) drug therapy: Secondary | ICD-10-CM

## 2013-08-30 DIAGNOSIS — I309 Acute pericarditis, unspecified: Secondary | ICD-10-CM

## 2013-08-30 DIAGNOSIS — F14120 Cocaine abuse with intoxication, uncomplicated: Secondary | ICD-10-CM

## 2013-08-30 DIAGNOSIS — F3289 Other specified depressive episodes: Secondary | ICD-10-CM

## 2013-08-30 DIAGNOSIS — F1420 Cocaine dependence, uncomplicated: Secondary | ICD-10-CM

## 2013-08-30 DIAGNOSIS — F329 Major depressive disorder, single episode, unspecified: Secondary | ICD-10-CM

## 2013-08-30 DIAGNOSIS — L089 Local infection of the skin and subcutaneous tissue, unspecified: Secondary | ICD-10-CM

## 2013-08-30 DIAGNOSIS — F1910 Other psychoactive substance abuse, uncomplicated: Secondary | ICD-10-CM

## 2013-08-30 DIAGNOSIS — Z0289 Encounter for other administrative examinations: Secondary | ICD-10-CM | POA: Insufficient documentation

## 2013-08-30 DIAGNOSIS — I776 Arteritis, unspecified: Secondary | ICD-10-CM

## 2013-08-30 DIAGNOSIS — Z91199 Patient's noncompliance with other medical treatment and regimen due to unspecified reason: Secondary | ICD-10-CM

## 2013-08-30 DIAGNOSIS — Z720 Tobacco use: Secondary | ICD-10-CM

## 2013-08-30 DIAGNOSIS — L539 Erythematous condition, unspecified: Secondary | ICD-10-CM | POA: Insufficient documentation

## 2013-08-30 DIAGNOSIS — L03114 Cellulitis of left upper limb: Secondary | ICD-10-CM

## 2013-08-30 DIAGNOSIS — E785 Hyperlipidemia, unspecified: Secondary | ICD-10-CM

## 2013-08-30 DIAGNOSIS — I10 Essential (primary) hypertension: Secondary | ICD-10-CM

## 2013-08-30 DIAGNOSIS — F17200 Nicotine dependence, unspecified, uncomplicated: Secondary | ICD-10-CM | POA: Insufficient documentation

## 2013-08-30 DIAGNOSIS — M069 Rheumatoid arthritis, unspecified: Secondary | ICD-10-CM | POA: Insufficient documentation

## 2013-08-30 DIAGNOSIS — R636 Underweight: Secondary | ICD-10-CM

## 2013-08-30 DIAGNOSIS — F332 Major depressive disorder, recurrent severe without psychotic features: Principal | ICD-10-CM | POA: Diagnosis present

## 2013-08-30 DIAGNOSIS — F29 Unspecified psychosis not due to a substance or known physiological condition: Secondary | ICD-10-CM

## 2013-08-30 DIAGNOSIS — N898 Other specified noninflammatory disorders of vagina: Secondary | ICD-10-CM

## 2013-08-30 DIAGNOSIS — F32A Depression, unspecified: Secondary | ICD-10-CM

## 2013-08-30 DIAGNOSIS — Z7982 Long term (current) use of aspirin: Secondary | ICD-10-CM | POA: Insufficient documentation

## 2013-08-30 DIAGNOSIS — N6320 Unspecified lump in the left breast, unspecified quadrant: Secondary | ICD-10-CM

## 2013-08-30 DIAGNOSIS — F1994 Other psychoactive substance use, unspecified with psychoactive substance-induced mood disorder: Secondary | ICD-10-CM

## 2013-08-30 DIAGNOSIS — E876 Hypokalemia: Secondary | ICD-10-CM

## 2013-08-30 DIAGNOSIS — M064 Inflammatory polyarthropathy: Secondary | ICD-10-CM | POA: Insufficient documentation

## 2013-08-30 DIAGNOSIS — M129 Arthropathy, unspecified: Secondary | ICD-10-CM

## 2013-08-30 DIAGNOSIS — Z9119 Patient's noncompliance with other medical treatment and regimen: Secondary | ICD-10-CM

## 2013-08-30 DIAGNOSIS — R45851 Suicidal ideations: Secondary | ICD-10-CM

## 2013-08-30 LAB — CBC WITH DIFFERENTIAL/PLATELET
Basophils Absolute: 0 10*3/uL (ref 0.0–0.1)
Eosinophils Relative: 3 % (ref 0–5)
HCT: 31 % — ABNORMAL LOW (ref 36.0–46.0)
Lymphocytes Relative: 46 % (ref 12–46)
Lymphs Abs: 1.4 10*3/uL (ref 0.7–4.0)
MCHC: 34.8 g/dL (ref 30.0–36.0)
MCV: 85.9 fL (ref 78.0–100.0)
Monocytes Absolute: 0.1 10*3/uL (ref 0.1–1.0)
Neutro Abs: 1.5 10*3/uL — ABNORMAL LOW (ref 1.7–7.7)
Platelets: 270 10*3/uL (ref 150–400)
RBC: 3.61 MIL/uL — ABNORMAL LOW (ref 3.87–5.11)
RDW: 14.6 % (ref 11.5–15.5)
WBC: 3 10*3/uL — ABNORMAL LOW (ref 4.0–10.5)

## 2013-08-30 LAB — COMPREHENSIVE METABOLIC PANEL
ALT: 14 U/L (ref 0–35)
AST: 23 U/L (ref 0–37)
CO2: 25 mEq/L (ref 19–32)
Chloride: 104 mEq/L (ref 96–112)
GFR calc Af Amer: 89 mL/min — ABNORMAL LOW (ref >90)
GFR calc non Af Amer: 76 mL/min — ABNORMAL LOW (ref >90)
Glucose, Bld: 105 mg/dL — ABNORMAL HIGH (ref 70–99)
Sodium: 139 mEq/L (ref 135–145)
Total Bilirubin: 0.3 mg/dL (ref 0.3–1.2)

## 2013-08-30 LAB — RAPID URINE DRUG SCREEN, HOSP PERFORMED
Amphetamines: POSITIVE — AB
Barbiturates: NOT DETECTED
Cocaine: POSITIVE — AB
Tetrahydrocannabinol: POSITIVE — AB

## 2013-08-30 MED ORDER — TRAZODONE HCL 50 MG PO TABS: 50.0000 mg | ORAL_TABLET | Freq: Every evening | ORAL | Status: DC | PRN

## 2013-08-30 MED ORDER — TRAZODONE HCL 50 MG PO TABS
50.0000 mg | ORAL_TABLET | Freq: Every evening | ORAL | Status: DC | PRN
  Administered 2013-08-31 – 2013-09-04 (×9): 50 mg via ORAL
  Filled 2013-08-30 (×14): qty 1

## 2013-08-30 MED ORDER — ASPIRIN EC 325 MG PO TBEC
325.0000 mg | DELAYED_RELEASE_TABLET | Freq: Every day | ORAL | Status: DC
  Administered 2013-08-30: 325 mg via ORAL
  Filled 2013-08-30: qty 1

## 2013-08-30 MED ORDER — LISINOPRIL 10 MG PO TABS
10.0000 mg | ORAL_TABLET | Freq: Every day | ORAL | Status: DC
  Administered 2013-08-30: 10 mg via ORAL
  Filled 2013-08-30: qty 1

## 2013-08-30 MED ORDER — IBUPROFEN 800 MG PO TABS
800.0000 mg | ORAL_TABLET | Freq: Once | ORAL | Status: AC
  Administered 2013-08-30: 800 mg via ORAL
  Filled 2013-08-30: qty 1

## 2013-08-30 MED ORDER — ONDANSETRON HCL 4 MG PO TABS: 4.0000 mg | ORAL_TABLET | Freq: Three times a day (TID) | ORAL | Status: DC | PRN

## 2013-08-30 MED ORDER — MAGNESIUM HYDROXIDE 400 MG/5ML PO SUSP: 30.0000 mL | Freq: Every day | ORAL | Status: DC | PRN

## 2013-08-30 MED ORDER — CITALOPRAM HYDROBROMIDE 20 MG PO TABS
20.0000 mg | ORAL_TABLET | Freq: Every day | ORAL | Status: DC
  Administered 2013-08-30: 20 mg via ORAL
  Filled 2013-08-30: qty 1

## 2013-08-30 MED ORDER — FOLIC ACID 1 MG PO TABS
1.0000 mg | ORAL_TABLET | Freq: Every day | ORAL | Status: DC
  Administered 2013-08-30: 1 mg via ORAL
  Filled 2013-08-30: qty 1

## 2013-08-30 MED ORDER — ZOLPIDEM TARTRATE 5 MG PO TABS: 5.0000 mg | ORAL_TABLET | Freq: Every evening | ORAL | Status: DC | PRN

## 2013-08-30 MED ORDER — POTASSIUM CHLORIDE CRYS ER 20 MEQ PO TBCR
20.0000 meq | EXTENDED_RELEASE_TABLET | Freq: Two times a day (BID) | ORAL | Status: AC
  Administered 2013-08-31 – 2013-09-01 (×4): 20 meq via ORAL
  Filled 2013-08-30 (×4): qty 1
  Filled 2013-08-30: qty 2

## 2013-08-30 MED ORDER — BACITRACIN-NEOMYCIN-POLYMYXIN OINTMENT TUBE
TOPICAL_OINTMENT | Freq: Two times a day (BID) | CUTANEOUS | Status: DC
  Administered 2013-08-30: 16:00:00 via TOPICAL
  Filled 2013-08-30: qty 15

## 2013-08-30 MED ORDER — LORAZEPAM 1 MG PO TABS: 1.0000 mg | ORAL_TABLET | Freq: Three times a day (TID) | ORAL | Status: DC | PRN

## 2013-08-30 MED ORDER — IBUPROFEN 200 MG PO TABS: 600.0000 mg | ORAL_TABLET | Freq: Three times a day (TID) | ORAL | Status: DC | PRN

## 2013-08-30 MED ORDER — ALUM & MAG HYDROXIDE-SIMETH 200-200-20 MG/5ML PO SUSP: 30.0000 mL | ORAL | Status: DC | PRN

## 2013-08-30 NOTE — ED Provider Notes (Signed)
CSN: [id number]     Arrival date & time 08/30/13  0125 History   First MD Initiated Contact with Patient 08/30/13 0153     Chief Complaint  Patient presents with  . Leg Pain   HPI  History provided by the patient. Patient is a 52 year old female with history of rheumatoid arthritis, cocaine abuse with associated vasculitis and hypertension who presents with complaints of pain and lesions to her lower legs after smoking crack cocaine. Patient admits to smoking crack cocaine in the evening prior to onset of symptoms. She reports developing small marks to the skin of her lower legs with pain. She also reports small amounts of bleeding from her left ear from similar past episodes of vasculitis. Patient states that she had been doing well and not using cocaine in several days but made a mistake and use this evening. She reports that she realized that this could happen from smoking. She denies any other aggravating or alleviating factors. No other associated symptoms.    Past Medical History  Diagnosis Date  . 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 Followed by Dr. Louanne Skye  . Hypertension   . Cocaine abuse     ongoing with resultant vaculitis.  . Tobacco abuse   . Depression   . Normocytic anemia     BL Hgb 9.8-12. Last anemia panel 04/2010 - showing Fe 19, ferritin 101.  Pt on monthly B12 injections  . Rheumatoid arthritis(714.0)     patient reported  . Inflammatory arthritis    Past Surgical History  Procedure Laterality Date  . Skin biopsy      bilateral shin nodules on 04/2010  . Hernia repair     Family History  Problem Relation Age of Onset  . Breast cancer Mother     Breast cancer  . Alcohol abuse Mother   . Colon cancer Maternal Aunt 34  . Alcohol abuse Father    History  Substance Use Topics  . Smoking status: Current Every Day Smoker -- 0.50 packs/day for 39 years    Types: Cigarettes  . Smokeless tobacco: Never Used     Comment: trying  . Alcohol Use: No   OB  History   Grav Para Term Preterm Abortions TAB SAB Ect Mult Living                 Review of Systems  Constitutional: Negative for fever, chills and diaphoresis.  All other systems reviewed and are negative.    Allergies  Acetaminophen  Home Medications   Current Outpatient Rx  Name  Route  Sig  Dispense  Refill  . aspirin EC 325 MG tablet   Oral   Take 650 mg by mouth every 6 (six) hours as needed for pain.         . citalopram (CELEXA) 20 MG tablet   Oral   Take 1 tablet (20 mg total) by mouth daily. For depression and anxiety.   30 tablet   0   . folic acid (FOLVITE) 1 MG tablet   Oral   Take 1 tablet (1 mg total) by mouth daily. For low folic acid.   100 tablet   0   . lisinopril (PRINIVIL,ZESTRIL) 10 MG tablet   Oral   Take 1 tablet (10 mg total) by mouth daily. For hypertension.   30 tablet   0   . neomycin-bacitracin-polymyxin (NEOSPORIN) OINT   Topical   Apply 1 application topically 2 (two) times daily. For wounds to  face, lips and ears.   3.75 Tube   0   . oxyCODONE (OXY IR/ROXICODONE) 5 MG immediate release tablet   Oral   Take 1 tablet (5 mg total) by mouth every 6 (six) hours as needed. For pain.   15 tablet   0   . traZODone (DESYREL) 50 MG tablet      Take one tablet at bedtime for insomnia.   15 tablet   0    BP 170/75  Pulse 106  Temp(Src) 98.4 F (36.9 C) (Oral)  Resp 18  SpO2 100% Physical Exam  Nursing note and vitals reviewed. Constitutional: She is oriented to person, place, and time. She appears well-developed and well-nourished. No distress.  HENT:  Head: Normocephalic and atraumatic.  Eyes: Pupils are equal, round, and reactive to light.  Neck: Normal range of motion.  Cardiovascular: Normal rate and regular rhythm.   Pulmonary/Chest: Effort normal and breath sounds normal. No respiratory distress. She has no wheezes. She has no rales.  Musculoskeletal: She exhibits no edema and no tenderness.  Neurological: She is  alert and oriented to person, place, and time.  Skin: Skin is warm and dry. No rash noted. There is erythema.  There is deformity and necrosis to bilateral outer ears consistent with history of vasculitis. Small amount of bleeding from the left ear. There are similar areas of necrosis to the tips of the nose and bilateral cheek areas of face.  A few small areas of purpuric and ecchymotic patches with darker central areas of the lower legs.   Psychiatric: She has a normal mood and affect. Her behavior is normal.    ED Course  Procedures    COORDINATION OF CARE:  Nursing notes reviewed. Vital signs reviewed. Initial pt interview and examination performed.   2:45 AM patient seen and evaluated. Patient does appear uncomfortable but in no acute distress. Patient has well-documented history of cocaine associated vasculopathy. She admits to cocaine use in the evening prior to onset of symptoms. She has small blisters about the lower extremities. There is a small amount of bleeding from necrotic left ear. No other changes in her previously diagnosed condition.   Patient reports only slight improvement with ibuprofen. She is tearful each time I entered the room. She now states that she is feeling very depressed and suicidal and "wants to go to [facility] She does not have any plan. Patient has been admitted for suicidal ideations last September. We'll plan screening labs and discussed with TTS.  Patient is medically cleared and psychiatric holding orders in placed. TTS consult placed.    Results for orders placed during the hospital encounter of 08/30/13  CBC WITH DIFFERENTIAL      Result Value Range   WBC 3.0 (*) 4.0 - 10.5 K/uL   RBC 3.61 (*) 3.87 - 5.11 MIL/uL   Hemoglobin 10.8 (*) 12.0 - 15.0 g/dL   HCT 31.0 (*) 36.0 - 46.0 %   MCV 85.9  78.0 - 100.0 fL   MCH 29.9  26.0 - 34.0 pg   MCHC 34.8  30.0 - 36.0 g/dL   RDW 14.6  11.5 - 15.5 %   Platelets 270  150 - 400 K/uL   Neutrophils Relative %  49  43 - 77 %   Neutro Abs 1.5 (*) 1.7 - 7.7 K/uL   Lymphocytes Relative 46  12 - 46 %   Lymphs Abs 1.4  0.7 - 4.0 K/uL   Monocytes Relative 2 (*) 3 - 12 %  Monocytes Absolute 0.1  0.1 - 1.0 K/uL   Eosinophils Relative 3  0 - 5 %   Eosinophils Absolute 0.1  0.0 - 0.7 K/uL   Basophils Relative 0  0 - 1 %   Basophils Absolute 0.0  0.0 - 0.1 K/uL  COMPREHENSIVE METABOLIC PANEL      Result Value Range   Sodium 139  135 - 145 mEq/L   Potassium 3.2 (*) 3.5 - 5.1 mEq/L   Chloride 104  96 - 112 mEq/L   CO2 25  19 - 32 mEq/L   Glucose, Bld 105 (*) 70 - 99 mg/dL   BUN 13  6 - 23 mg/dL   Creatinine, Ser 0.87  0.50 - 1.10 mg/dL   Calcium 9.1  8.4 - 10.5 mg/dL   Total Protein 6.7  6.0 - 8.3 g/dL   Albumin 3.4 (*) 3.5 - 5.2 g/dL   AST 23  0 - 37 U/L   ALT 14  0 - 35 U/L   Alkaline Phosphatase 64  39 - 117 U/L   Total Bilirubin 0.3  0.3 - 1.2 mg/dL   GFR calc non Af Amer 76 (*) >90 mL/min   GFR calc Af Amer 89 (*) >90 mL/min  ETHANOL      Result Value Range   Alcohol, Ethyl (B) <11  0 - 11 mg/dL  URINE RAPID DRUG SCREEN (HOSP PERFORMED)      Result Value Range   Opiates NONE DETECTED  NONE DETECTED   Cocaine POSITIVE (*) NONE DETECTED   Benzodiazepines NONE DETECTED  NONE DETECTED   Amphetamines POSITIVE (*) NONE DETECTED   Tetrahydrocannabinol POSITIVE (*) NONE DETECTED   Barbiturates NONE DETECTED  NONE DETECTED       MDM   1. Vasculitis   2. Cocaine abuse   3. Suicidal ideation   4. Depression        Martie Lee, PA-C 08/30/13 [phone]

## 2013-08-30 NOTE — ED Provider Notes (Signed)
Medical screening examination/treatment/procedure(s) were conducted as a shared visit with non-physician practitioner(s) and myself.  I personally evaluated the patient during the encounter.  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  She continues to abuse cocaine.  She has multiple lesions, notably to left ear, nose, BLE's which do not appear actively infected.  Given NSAIDs for pain control.  She also endorsed suicidal ideations.  Psychiatry consulted.  Clinical Impression: 1. Vasculitis   2. Cocaine abuse   3. Suicidal ideation   4. Depression   5. MDD (major depressive disorder)   6. SUBSTANCE ABUSE, MULTIPLE       Houston Siren, MD 08/30/13 2039

## 2013-08-30 NOTE — Consult Note (Signed)
[facility] Psychiatry Consult   Reason for Consult:  Major Depressive Disorder Referring Physician:  EDP  [name] is an 52 y.o. female.  Assessment: AXIS I:  Major Depressive Disorder AXIS II:  Deferred AXIS III:   Past Medical History  Diagnosis Date  . 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 Followed by Dr. Louanne Skye  . Hypertension   . Cocaine abuse     ongoing with resultant vaculitis.  . Tobacco abuse   . Depression   . Normocytic anemia     BL Hgb 9.8-12. Last anemia panel 04/2010 - showing Fe 19, ferritin 101.  Pt on monthly B12 injections  . Rheumatoid arthritis(714.0)     patient reported  . Inflammatory arthritis    AXIS IV:  housing problems, occupational problems, other psychosocial or environmental problems, problems related to social environment and problems with primary support group AXIS V:  11-20 some danger of hurting self or others possible OR occasionally fails to maintain minimal personal hygiene OR gross impairment in communication  Plan:  Recommend psychiatric Inpatient admission when medically cleared.  Subjective:   [name] is a 52 y.o. female patient.  HPI:  Patient crying stating that he often has thoughts of suicide but has not acted on them.  Patient states that she does have a history of suicide attempt this year and was hospitalized at [facility].  Patient states that the trigger for her depression is "When ever I come in to the emergency room about my ears/skin.  The doctors don't look at me the inner me they just look at the fact that I tested positive for cocaine.  I was doing good with out the drugs  But my skin (pointing to her ears nose, and upper thigh that has necrotic tissue).  I have been homeless for a while and use to sleep [place] were at.  I don't know what is causing my skin to do this."  Patient states that she has no family here in [place] and hasn't spoken to anyone in her family for 17 years.  "I  have called but never got an answer.  I came here to do a bit (jail time) and when I got released I just stayed here and never went home.  I don't even know if they are alive or dead."  Patient is sobbing, flat affect.  Patient passive suicidal thoughts without plan but does have history of previous attempt this year by overdosing.  Patient endorses haplessness with fear that things will never change for her.  Patient overwhelmed with being alone and skin condition. Patient also states that she has a history of hearing voices.  "I'm not hearing them right now; they tell me to hurt my self or other people.  The last time was yesterday."  Patient denies homicidal ideation and paranoia. Patient also states after her last discharge she was given medication for depression and hearing voices but ran out.  Patient states that she did not know where she was suppose to go to get more medication so she just hasn't had any.    Patient last admission to [facility] was 06/2013; patient was to follow up at [facility].  Patients states that she does not know where [facility] is.   HPI Elements:   Location:  [facility] ED. Quality:  Affecting patient mentally and physically. Severity:  Suicidal thoughts and feelings of hopelessness.  Past Psychiatric History: Past Medical  History  Diagnosis Date  . 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 Followed by Dr. Louanne Skye  . Hypertension   . Cocaine abuse     ongoing with resultant vaculitis.  . Tobacco abuse   . Depression   . Normocytic anemia     BL Hgb 9.8-12. Last anemia panel 04/2010 - showing Fe 19, ferritin 101.  Pt on monthly B12 injections  . Rheumatoid arthritis(714.0)     patient reported  . Inflammatory arthritis     reports that she has been smoking Cigarettes.  She has a 19.5 pack-year smoking history. She has never used smokeless tobacco. She reports that she uses illicit drugs ("Crack" cocaine, Cocaine, and Marijuana). She reports that she does not  drink alcohol. Family History  Problem Relation Age of Onset  . Breast cancer Mother     Breast cancer  . Alcohol abuse Mother   . Colon cancer Maternal Aunt 62  . Alcohol abuse Father            Allergies:   Allergies  Allergen Reactions  . Acetaminophen Swelling    REACTION: eyelid swelling    ACT Assessment Complete:  No:   Past Psychiatric History: Diagnosis:  Major Depressive Disorder  Hospitalizations:  Prior history last admission to [facility] 06/2013  Outpatient Care:  Denies  Substance Abuse Care:  Cocaine, Amphetamines, and Mariajuana   Self-Mutilation:  Denies  Suicidal Attempts:  Prior attempt this year via overdose  Homicidal Behaviors:  Denies   Violent Behaviors:  Denies   Place of Residence:  Homeless in [place] Marital Status:  Single Employed/Unemployed:  Unemployed Education:   Family Supports:  Denies.  No family locally and hasn't spoken to family in  Objective: Blood pressure 170/75, pulse 106, temperature 98.4 F (36.9 C), temperature source Oral, resp. rate 18, SpO2 100.00%.There is no weight on file to calculate BMI. Results for orders placed during the hospital encounter of 08/30/13 (from the past 72 hour(s))  CBC WITH DIFFERENTIAL     Status: Abnormal   Collection Time    08/30/13  4:11 AM      Result Value Range   WBC 3.0 (*) 4.0 - 10.5 K/uL   RBC 3.61 (*) 3.87 - 5.11 MIL/uL   Hemoglobin 10.8 (*) 12.0 - 15.0 g/dL   HCT 31.0 (*) 36.0 - 46.0 %   MCV 85.9  78.0 - 100.0 fL   MCH 29.9  26.0 - 34.0 pg   MCHC 34.8  30.0 - 36.0 g/dL   RDW 14.6  11.5 - 15.5 %   Platelets 270  150 - 400 K/uL   Neutrophils Relative % 49  43 - 77 %   Neutro Abs 1.5 (*) 1.7 - 7.7 K/uL   Lymphocytes Relative 46  12 - 46 %   Lymphs Abs 1.4  0.7 - 4.0 K/uL   Monocytes Relative 2 (*) 3 - 12 %   Monocytes Absolute 0.1  0.1 - 1.0 K/uL   Eosinophils Relative 3  0 - 5 %   Eosinophils Absolute 0.1  0.0 - 0.7 K/uL   Basophils Relative 0  0 - 1 %   Basophils Absolute  0.0  0.0 - 0.1 K/uL  COMPREHENSIVE METABOLIC PANEL     Status: Abnormal   Collection Time    08/30/13  4:11 AM      Result Value Range   Sodium 139  135 - 145 mEq/L   Potassium 3.2 (*) 3.5 - 5.1 mEq/L  Chloride 104  96 - 112 mEq/L   CO2 25  19 - 32 mEq/L   Glucose, Bld 105 (*) 70 - 99 mg/dL   BUN 13  6 - 23 mg/dL   Creatinine, Ser 0.87  0.50 - 1.10 mg/dL   Calcium 9.1  8.4 - 10.5 mg/dL   Total Protein 6.7  6.0 - 8.3 g/dL   Albumin 3.4 (*) 3.5 - 5.2 g/dL   AST 23  0 - 37 U/L   ALT 14  0 - 35 U/L   Alkaline Phosphatase 64  39 - 117 U/L   Total Bilirubin 0.3  0.3 - 1.2 mg/dL   GFR calc non Af Amer 76 (*) >90 mL/min   GFR calc Af Amer 89 (*) >90 mL/min   Comment: (NOTE)     The eGFR has been calculated using the CKD EPI equation.     This calculation has not been validated in all clinical situations.     eGFR's persistently <90 mL/min signify possible Chronic Kidney     Disease.  ETHANOL     Status: None   Collection Time    08/30/13  4:11 AM      Result Value Range   Alcohol, Ethyl (B) <11  0 - 11 mg/dL   Comment:            LOWEST DETECTABLE LIMIT FOR     SERUM ALCOHOL IS 11 mg/dL     FOR MEDICAL PURPOSES ONLY  URINE RAPID DRUG SCREEN (HOSP PERFORMED)     Status: Abnormal   Collection Time    08/30/13  4:42 AM      Result Value Range   Opiates NONE DETECTED  NONE DETECTED   Cocaine POSITIVE (*) NONE DETECTED   Benzodiazepines NONE DETECTED  NONE DETECTED   Amphetamines POSITIVE (*) NONE DETECTED   Tetrahydrocannabinol POSITIVE (*) NONE DETECTED   Barbiturates NONE DETECTED  NONE DETECTED   Comment:            DRUG SCREEN FOR MEDICAL PURPOSES     ONLY.  IF CONFIRMATION IS NEEDED     FOR ANY PURPOSE, NOTIFY LAB     WITHIN 5 DAYS.                LOWEST DETECTABLE LIMITS     FOR URINE DRUG SCREEN     Drug Class       Cutoff (ng/mL)     Amphetamine      1000     Barbiturate      200     Benzodiazepine   784     Tricyclics       696     Opiates          300      Cocaine          300     THC              50    Current Facility-Administered Medications  Medication Dose Route Frequency Provider Last Rate Last Dose  . ibuprofen (ADVIL,MOTRIN) tablet 600 mg  600 mg Oral Q8H PRN Martie Lee, PA-C      . LORazepam (ATIVAN) tablet 1 mg  1 mg Oral Q8H PRN Ruthell Rummage Dammen, PA-C      . ondansetron [facility]) tablet 4 mg  4 mg Oral Q8H PRN Ruthell Rummage Dammen, PA-C      . zolpidem (AMBIEN) tablet 5 mg  5 mg Oral QHS PRN  Martie Lee, PA-C       Current Outpatient Prescriptions  Medication Sig Dispense Refill  . aspirin EC 325 MG tablet Take 650 mg by mouth every 6 (six) hours as needed for pain.      . citalopram (CELEXA) 20 MG tablet Take 1 tablet (20 mg total) by mouth daily. For depression and anxiety.  30 tablet  0  . folic acid (FOLVITE) 1 MG tablet Take 1 tablet (1 mg total) by mouth daily. For low folic acid.  100 tablet  0  . lisinopril (PRINIVIL,ZESTRIL) 10 MG tablet Take 1 tablet (10 mg total) by mouth daily. For hypertension.  30 tablet  0  . neomycin-bacitracin-polymyxin (NEOSPORIN) OINT Apply 1 application topically 2 (two) times daily. For wounds to face, lips and ears.  3.75 Tube  0  . oxyCODONE (OXY IR/ROXICODONE) 5 MG immediate release tablet Take 1 tablet (5 mg total) by mouth every 6 (six) hours as needed. For pain.  15 tablet  0  . traZODone (DESYREL) 50 MG tablet Take one tablet at bedtime for insomnia.  15 tablet  0    Psychiatric Specialty Exam:     Blood pressure 170/75, pulse 106, temperature 98.4 F (36.9 C), temperature source Oral, resp. rate 18, SpO2 100.00%.There is no weight on file to calculate BMI.  General Appearance: Disheveled  Eye Contact::  Good  Speech:  Clear and Coherent and Normal Rate  Volume:  Normal  Mood:  Depressed and Hopeless  Affect:  Congruent, Depressed and Flat  Thought Process:  Circumstantial and Goal Directed  Orientation:  Full (Time, Place, and Person)  Thought Content:  Rumination  Suicidal  Thoughts:  Yes.  without intent/plan  Homicidal Thoughts:  No  Memory:  Immediate;   Good Recent;   Good Remote;   Good  Judgement:  Impaired  Insight:  Lacking  Psychomotor Activity:  Normal  Concentration:  Good  Recall:  Good  Akathisia:  No  Handed:  Right  AIMS (if indicated):     Assets:  Communication Skills Desire for Improvement  Sleep:      Face to face consult/interview with Dr. Lovena Le  Treatment Plan Summary: Daily contact with patient to assess and evaluate symptoms and progress in treatment Medication management  Disposition:  Inpatient treatment recommended.  Start home medications. Monitor for safety and stabilization until inpatient bed is found.    Earleen Newport, FNP-BC 08/30/2013 12:19 PM

## 2013-08-30 NOTE — Progress Notes (Signed)
Patient ID: MANDEE PLUTA, female   DOB: [birth year], 52 y.o.   MRN: [id number] Pt. Is 52 yo female, recently discharged from [facility] 9/14. Pt. Reports "I'm hear for these blisters, but they gonna see the cocaine and try treat me for that, I told them it ain't that". Pt. Is disheveled and animated. Pt. Has blisters to left ear, thighs bilaterally. Pt. Currently denies SI, but reportedly came to ED with SI. Pt. Has medical history of vasculitis, anemia, HTN and RA. Pt. Oriented to [place]/unit. Staff will monitor q69mn for safety.

## 2013-08-30 NOTE — ED Notes (Signed)
Pt. Ambulated from the ED to room 34 without difficulty.  She reports that she came in to the ED for evaluation of skin issues possibly related to her crack/cocaine abuse.  She was tearful during assessment, she denies any HI/SI at this time but reports that she admitted to [facility] initially due to the pain associated with her skin.  She states that she does occasionally have auditory and visual hallucinations but not often.

## 2013-08-30 NOTE — Progress Notes (Signed)
   CARE MANAGEMENT ED NOTE 08/30/2013  Patient:  [name], [name]   Account Number:  [id number]  Date Initiated:  08/30/2013  Documentation initiated by:  Jackelyn Poling  Subjective/Objective Assessment:   52 yr old [place] access pt pcp listed as Charlann Lange Cm consulted by [facility] MD/NP to assist pt wth lists of [place] dermatologists, pcps and commmunity resources     Subjective/Objective Assessment Detail:   Per EDP noted: deformity and necrosis to bilateral outer ears consistent with history of vasculitis. Small amount of bleeding from the left ear. There are similar areas of necrosis to the tips of the nose and bilateral cheek areas of face.    A few small areas of purpuric and ecchymotic patches with darker central areas of the lower legs.     Action/Plan:   Cm spoke with pt and reviewed resources and left written information for pt see below note for details   Action/Plan Detail:   Anticipated DC Date:  08/30/2013     Status Recommendation to Physician:   Result of Recommendation:    Other ED [place]  Other  PCP issues  Outpatient Services - Pt will follow up  Other    Choice offered to / List presented to:            Status of service:  Completed, signed off  ED Comments:   ED Comments Detail:  CM discussed and provided written information for self pay pcps, importance of pcp for f/u care, www.needymeds.org, discounted pharmacies and other [place] resources such as financial assistance, DSS and  health department Reviewed resources for [employer] self pay pcps like [employer], family medicine at [address], [facility] family practice, general medical clinics, [facility] urgent care plus others, [facility] out patient pharmacies and housing Pt voiced understanding and appreciation of resources provided

## 2013-08-30 NOTE — ED Notes (Signed)
Pt complains of high blood pressure and vasculitis on her legs that are bleeding and painful, pt smoked crack earlier today then took a shower and wasn't able to wash her legs because of the pain

## 2013-08-30 NOTE — ED Provider Notes (Signed)
Medical screening examination/treatment/procedure(s) were conducted as a shared visit with non-physician practitioner(s) and myself.  I personally evaluated the patient during the encounter.   Please see my separate note.     Houston Siren, MD 08/30/13 2040

## 2013-08-30 NOTE — ED Notes (Signed)
No acute distress noted.

## 2013-08-31 ENCOUNTER — Encounter (HOSPITAL_COMMUNITY): Payer: Self-pay | Admitting: Psychiatry

## 2013-08-31 MED ORDER — AMANTADINE HCL 100 MG PO CAPS
100.0000 mg | ORAL_CAPSULE | Freq: Two times a day (BID) | ORAL | Status: DC
  Administered 2013-08-31 – 2013-09-05 (×10): 100 mg via ORAL
  Filled 2013-08-31 (×13): qty 1

## 2013-08-31 MED ORDER — BUPROPION HCL 100 MG PO TABS
100.0000 mg | ORAL_TABLET | Freq: Two times a day (BID) | ORAL | Status: DC
  Administered 2013-08-31 – 2013-09-05 (×10): 100 mg via ORAL
  Filled 2013-08-31 (×14): qty 1

## 2013-08-31 NOTE — [facility] Suicide Risk Assessment (Signed)
Suicide Risk Assessment  Admission Assessment     Nursing information obtained from:  Patient Demographic factors:  Low socioeconomic status;Unemployed Current Mental Status:  NA Loss Factors:  Decline in physical health;Financial problems / change in socioeconomic status Historical Factors:  Prior suicide attempts;Family history of mental illness or substance abuse Risk Reduction Factors:  NA  CLINICAL FACTORS:   Depression:   Comorbid alcohol abuse/dependence Alcohol/Substance Abuse/Dependencies Chronic Pain Medical Diagnoses and Treatments/Surgeries  COGNITIVE FEATURES THAT CONTRIBUTE TO RISK:  Closed-mindedness Polarized thinking Thought constriction (tunnel vision)    SUICIDE RISK:   Moderate:  Frequent suicidal ideation with limited intensity, and duration, some specificity in terms of plans, no associated intent, good self-control, limited dysphoria/symptomatology, some risk factors present, and identifiable protective factors, including available and accessible social support.  PLAN OF CARE: Supportive approach/coping skills/relapse prevention                               Detox as needed                               Reassess and address the co morbidities  I certify that inpatient services furnished can reasonably be expected to improve the patient's condition.  Destinee Taber A 08/31/2013, 3:33 PM

## 2013-08-31 NOTE — H&P (Signed)
Psychiatric Admission Assessment Adult  Patient Identification:  [name] Date of Evaluation:  08/31/2013 Chief Complaint:  MAJOR DEPRESSIVE DISORDER History of Present Illness:: [name] was admitted for suicidal ideation for continued crack cocaine use causing vasculitis. Patient is homeless and non compliant with follow up. She notes that she did well for a while staying clean but recently relapsed and now she has new lesions on her legs, ears, and face. She is tearful, helpless, hopeless, and states she doesn't want to go on living like this. Elements:  Location:  adult unit. Quality:  severe. Severity:  chronic. Timing:  ongoing. Duration:  years. Context:  homeless, no support, powerless to stop using crack cocaine. Associated Signs/Synptoms: Depression Symptoms:  depressed mood, anhedonia, insomnia, psychomotor retardation, feelings of worthlessness/guilt, difficulty concentrating, hopelessness, impaired memory, panic attacks, (Hypo) Manic Symptoms:  Hallucinations, Anxiety Symptoms:  Excessive Worry, Panic Symptoms, Psychotic Symptoms:  Hallucinations: Auditory Visual PTSD Symptoms: NA  Psychiatric Specialty Exam: Physical Exam  ROS  Blood pressure 154/91, pulse 73, temperature 98 F (36.7 C), temperature source Oral, resp. rate 16, height 5' 2"  (1.575 m), weight 43.092 kg (95 lb).Body mass index is 17.37 kg/(m^2).  General Appearance: Disheveled  Eye Contact::  Minimal  Speech:  Clear and Coherent  Volume:  Normal  Mood:  Angry, Anxious and Depressed  Affect:  Congruent  Thought Process:  Circumstantial and Goal Directed  Orientation:  Full (Time, Place, and Person)  Thought Content:  WDL  Suicidal Thoughts:  Yes.  without intent/plan  Homicidal Thoughts:  No  Memory:  NA  Judgement:  Impaired  Insight:  Shallow  Psychomotor Activity:  Restlessness  Concentration:  Poor  Recall:  Fair  Akathisia:  No  Handed:  Right  AIMS (if indicated):     Assets:   Desire for Improvement  Sleep:       Past Psychiatric History: Diagnosis:  Hospitalizations:  Outpatient Care:  Substance Abuse Care:  Self-Mutilation:  Suicidal Attempts:  Violent Behaviors:   Past Medical History:   Past Medical History  Diagnosis Date  . 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 Followed by Dr. Louanne Skye  . Hypertension   . Cocaine abuse     ongoing with resultant vaculitis.  . Tobacco abuse   . Depression   . Normocytic anemia     BL Hgb 9.8-12. Last anemia panel 04/2010 - showing Fe 19, ferritin 101.  Pt on monthly B12 injections  . Rheumatoid arthritis(714.0)     patient reported  . Inflammatory arthritis     Allergies:   Allergies  Allergen Reactions  . Acetaminophen Swelling    REACTION: eyelid swelling   PTA Medications: Prescriptions prior to admission  Medication Sig Dispense Refill  . aspirin EC 325 MG tablet Take 650 mg by mouth every 6 (six) hours as needed for pain.      . citalopram (CELEXA) 20 MG tablet Take 1 tablet (20 mg total) by mouth daily. For depression and anxiety.  30 tablet  0  . folic acid (FOLVITE) 1 MG tablet Take 1 tablet (1 mg total) by mouth daily. For low folic acid.  100 tablet  0  . lisinopril (PRINIVIL,ZESTRIL) 10 MG tablet Take 1 tablet (10 mg total) by mouth daily. For hypertension.  30 tablet  0  . neomycin-bacitracin-polymyxin (NEOSPORIN) OINT Apply 1 application topically 2 (two) times daily. For wounds to face, lips and ears.  3.75 Tube  0  . oxyCODONE (OXY IR/ROXICODONE) 5 MG immediate release  tablet Take 1 tablet (5 mg total) by mouth every 6 (six) hours as needed. For pain.  15 tablet  0  . traZODone (DESYREL) 50 MG tablet Take one tablet at bedtime for insomnia.  15 tablet  0    Previous Psychotropic Medications:  Medication/Dose                 Substance Abuse History in the last 12 months:  yes  Consequences of Substance Abuse: Medical Consequences:  worsening vasculitis  Social History:   reports that she has been smoking Cigarettes.  She has a 19.5 pack-year smoking history. She has never used smokeless tobacco. She reports that she uses illicit drugs ("Crack" cocaine, Cocaine, and Marijuana). She reports that she does not drink alcohol. Additional Social History: History of alcohol / drug use?: No history of alcohol / drug abuse                    Current Place of Residence:   Place of Birth:   Family Members: Marital Status:  Single Children:  Sons:  Daughters: Relationships: Education:   Educational Problems/Performance: Religious Beliefs/Practices: History of Abuse (Emotional/Phsycial/Sexual) Occupational Experiences; Military History:   Legal History: Hobbies/Interests:  Family History:   Family History  Problem Relation Age of Onset  . Breast cancer Mother     Breast cancer  . Alcohol abuse Mother   . Colon cancer Maternal Aunt 27  . Alcohol abuse Father     Results for orders placed during the hospital encounter of 08/30/13 (from the past 72 hour(s))  CBC WITH DIFFERENTIAL     Status: Abnormal   Collection Time    08/30/13  4:11 AM      Result Value Range   WBC 3.0 (*) 4.0 - 10.5 K/uL   RBC 3.61 (*) 3.87 - 5.11 MIL/uL   Hemoglobin 10.8 (*) 12.0 - 15.0 g/dL   HCT 31.0 (*) 36.0 - 46.0 %   MCV 85.9  78.0 - 100.0 fL   MCH 29.9  26.0 - 34.0 pg   MCHC 34.8  30.0 - 36.0 g/dL   RDW 14.6  11.5 - 15.5 %   Platelets 270  150 - 400 K/uL   Neutrophils Relative % 49  43 - 77 %   Neutro Abs 1.5 (*) 1.7 - 7.7 K/uL   Lymphocytes Relative 46  12 - 46 %   Lymphs Abs 1.4  0.7 - 4.0 K/uL   Monocytes Relative 2 (*) 3 - 12 %   Monocytes Absolute 0.1  0.1 - 1.0 K/uL   Eosinophils Relative 3  0 - 5 %   Eosinophils Absolute 0.1  0.0 - 0.7 K/uL   Basophils Relative 0  0 - 1 %   Basophils Absolute 0.0  0.0 - 0.1 K/uL  COMPREHENSIVE METABOLIC PANEL     Status: Abnormal   Collection Time    08/30/13  4:11 AM      Result Value Range   Sodium 139  135 - 145  mEq/L   Potassium 3.2 (*) 3.5 - 5.1 mEq/L   Chloride 104  96 - 112 mEq/L   CO2 25  19 - 32 mEq/L   Glucose, Bld 105 (*) 70 - 99 mg/dL   BUN 13  6 - 23 mg/dL   Creatinine, Ser 0.87  0.50 - 1.10 mg/dL   Calcium 9.1  8.4 - 10.5 mg/dL   Total Protein 6.7  6.0 - 8.3 g/dL   Albumin 3.4 (*)  3.5 - 5.2 g/dL   AST 23  0 - 37 U/L   ALT 14  0 - 35 U/L   Alkaline Phosphatase 64  39 - 117 U/L   Total Bilirubin 0.3  0.3 - 1.2 mg/dL   GFR calc non Af Amer 76 (*) >90 mL/min   GFR calc Af Amer 89 (*) >90 mL/min   Comment: (NOTE)     The eGFR has been calculated using the CKD EPI equation.     This calculation has not been validated in all clinical situations.     eGFR's persistently <90 mL/min signify possible Chronic Kidney     Disease.  ETHANOL     Status: None   Collection Time    08/30/13  4:11 AM      Result Value Range   Alcohol, Ethyl (B) <11  0 - 11 mg/dL   Comment:            LOWEST DETECTABLE LIMIT FOR     SERUM ALCOHOL IS 11 mg/dL     FOR MEDICAL PURPOSES ONLY  URINE RAPID DRUG SCREEN (HOSP PERFORMED)     Status: Abnormal   Collection Time    08/30/13  4:42 AM      Result Value Range   Opiates NONE DETECTED  NONE DETECTED   Cocaine POSITIVE (*) NONE DETECTED   Benzodiazepines NONE DETECTED  NONE DETECTED   Amphetamines POSITIVE (*) NONE DETECTED   Tetrahydrocannabinol POSITIVE (*) NONE DETECTED   Barbiturates NONE DETECTED  NONE DETECTED   Comment:            DRUG SCREEN FOR MEDICAL PURPOSES     ONLY.  IF CONFIRMATION IS NEEDED     FOR ANY PURPOSE, NOTIFY LAB     WITHIN 5 DAYS.                LOWEST DETECTABLE LIMITS     FOR URINE DRUG SCREEN     Drug Class       Cutoff (ng/mL)     Amphetamine      1000     Barbiturate      200     Benzodiazepine   469     Tricyclics       629     Opiates          300     Cocaine          300     THC              50   Psychological Evaluations:  Assessment:   DSM5:  Schizophrenia Disorders:   Obsessive-Compulsive Disorders:    Trauma-Stressor Disorders:   Substance/Addictive Disorders:  Cocaine dependency Depressive Disorders:  Major Depressive Disorder - Severe (296.23)  AXIS I:  Major Depression, Recurrent severe AXIS II:  Deferred AXIS III:   Past Medical History  Diagnosis Date  . 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 Followed by Dr. Louanne Skye  . Hypertension   . Cocaine abuse     ongoing with resultant vaculitis.  . Tobacco abuse   . Depression   . Normocytic anemia     BL Hgb 9.8-12. Last anemia panel 04/2010 - showing Fe 19, ferritin 101.  Pt on monthly B12 injections  . Rheumatoid arthritis(714.0)     patient reported  . Inflammatory arthritis    AXIS IV:  economic problems, housing problems, occupational problems, problems related to legal system/crime and problems related to social environment  AXIS V:  41-50 serious symptoms  Treatment Plan/Recommendations:   1. Admit for crisis management and stabilization. 2. Medication management to reduce current symptoms to base line and improve the patient's overall level of functioning. 3. Treat health problems as indicated. 4. Develop treatment plan to decrease risk of relapse upon discharge and to reduce the need for readmission. 5. Psycho-social education regarding relapse prevention and self care. 6. Health care follow up as needed for medical problems. 7. Restart home medications where appropriate.  Treatment Plan Summary: Daily contact with patient to assess and evaluate symptoms and progress in treatment Medication management Supportive approach/coping skills/relapse prevention Identify detox needs Optimize treatment with psychotropics Current Medications:  Current Facility-Administered Medications  Medication Dose Route Frequency Provider Last Rate Last Dose  . alum & mag hydroxide-simeth (MAALOX/MYLANTA) 200-200-20 MG/5ML suspension 30 mL  30 mL Oral Q4H PRN Laverle Hobby, PA-C      . magnesium hydroxide (MILK OF MAGNESIA) suspension  30 mL  30 mL Oral Daily PRN Laverle Hobby, PA-C      . potassium chloride SA (K-DUR,KLOR-CON) CR tablet 20 mEq  20 mEq Oral BID Laverle Hobby, PA-C   20 mEq at 08/31/13 0820  . traZODone (DESYREL) tablet 50 mg  50 mg Oral QHS,MR X 1 Laverle Hobby, PA-C        Observation Level/Precautions:  routine  Laboratory:  reviewed  Psychotherapy:    Medications:   Welbutrin, Amantadine  Consultations:  Wound Care nurse  Discharge Concerns:  Housing, access to healthcare  Estimated LOS:  3-5 days  Other:  Needs long term residential treatment   I certify that inpatient services furnished can reasonably be expected to improve the patient's condition.   Marlane Hatcher. Mashburn RPAC 3:47 PM 08/31/2013 Agree with assessment and plan Geralyn Flash A. Sabra Heck, M.D.

## 2013-08-31 NOTE — BHH Group Notes (Signed)
[facility] Group Notes:  (Nursing/MHT/Case Management/Adjunct)  Date:  08/31/2013  Time:  0915  Type of Therapy:  Psychoeducational Skills  Participation Level:  Active  Participation Quality:  Appropriate  Affect:  Appropriate  Cognitive:  Appropriate  Insight:  Appropriate  Engagement in Group:  Limited  Modes of Intervention:  Support  Summary of Progress/Problems:Morning Wellness group  Maureen Chatters [facility] 08/31/2013, 10:53 AM

## 2013-08-31 NOTE — Progress Notes (Signed)
D.  Pt. Denies SI/HI and denies A/V hallucinations.  No concerns voiced at present.  Pt. Reported that she will see a wound nurse tomorrow.  Compliant with medication.  Pt. Showered. A.  Encouragement and support given. R.  Pt. Receptive and remains safe.

## 2013-08-31 NOTE — Progress Notes (Signed)
D: Patient's affect appropriate and mood is anxious. Declined self inventory sheet today. Writer observed patient lying in bed the majority of the morning. Patient attended one group. Compliant with medication at this time.  A: Support and encouragement provided to patient. Administered scheduled medications per ordering MD. Monitor Q15 minute checks for safety.  R: Patient receptive. Denies SI/HI and auditory/visual hallucinations. Patient remains safe on the unit.

## 2013-08-31 NOTE — Progress Notes (Signed)
D: pt asleep, no signs of distress or labored breathing A: q 15 min safety checks R: pt. Remains safe on unit

## 2013-09-01 DIAGNOSIS — R45851 Suicidal ideations: Secondary | ICD-10-CM

## 2013-09-01 MED ORDER — IBUPROFEN 200 MG PO TABS
400.0000 mg | ORAL_TABLET | Freq: Four times a day (QID) | ORAL | Status: DC | PRN
  Administered 2013-09-01 – 2013-09-05 (×13): 400 mg via ORAL
  Filled 2013-09-01 (×13): qty 2

## 2013-09-01 NOTE — Progress Notes (Signed)
Adult Psychoeducational Group Note  Date:  09/01/2013 Time:  10:30 PM  Group Topic/Focus:  Wrap-Up Group:   The focus of this group is to help patients review their daily goal of treatment and discuss progress on daily workbooks.  Participation Level:  Active  Participation Quality:  Appropriate  Affect:  Appropriate  Cognitive:  Appropriate  Insight: Appropriate  Engagement in Group:  Engaged  Modes of Intervention:  Discussion  Additional Comments:  Pt stated she had a good day today because she was able to get some rest.  Clint Bolder 09/01/2013, 10:30 PM

## 2013-09-01 NOTE — Progress Notes (Signed)
[facility] MD Progress Note  09/01/2013 9:31 PM [name]  MRN:  [id number] Subjective: Patient staying in bed states her ear hurts and asking for a pain pill. States she slept well, ate well and feels much better since wound nurse came by. She states she is "good to go!" Diagnosis:   DSM5:  Schizophrenia Disorders:  Obsessive-Compulsive Disorders:  Trauma-Stressor Disorders:  Substance/Addictive Disorders: Cocaine dependency  Depressive Disorders: Major Depressive Disorder - Severe (296.23)  AXIS I: Major Depression, Recurrent severe  AXIS II: Deferred  AXIS III:  Past Medical History   Diagnosis  Date   .  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 Followed by Dr. Louanne Skye   .  Hypertension    .  Cocaine abuse      ongoing with resultant vaculitis.   .  Tobacco abuse    .  Depression    .  Normocytic anemia      BL Hgb 9.8-12. Last anemia panel 04/2010 - showing Fe 19, ferritin 101. Pt on monthly B12 injections   .  Rheumatoid arthritis(714.0)      patient reported   .  Inflammatory arthritis     AXIS IV: economic problems, housing problems, occupational problems, problems related to legal system/crime and problems related to social environment  AXIS V: 41-50 serious symptoms ADL's:  Intact  Sleep: Good  Appetite:  Good  Suicidal Ideation:  Patient states they are decreasing  Homicidal Ideation:  denies AEB (as evidenced by):  Psychiatric Specialty Exam: ROS  Blood pressure 118/80, pulse 81, temperature 97.8 F (36.6 C), temperature source Oral, resp. rate 16, height 5' 2"  (1.575 m), weight 43.092 kg (95 lb).Body mass index is 17.37 kg/(m^2).  General Appearance: Disheveled  Eye Sport and exercise psychologist::  Fair  Speech:  Clear and Coherent  Volume:  Normal  Mood:  Anxious and Depressed  Affect:  Congruent  Thought Process:  Goal Directed  Orientation:  Full (Time, Place, and Person)  Thought Content:  WDL  Suicidal Thoughts:  No  Homicidal Thoughts:  No  Memory:  NA  Judgement:   Poor  Insight:  Lacking  Psychomotor Activity:  Normal  Concentration:  Poor  Recall:  Poor  Akathisia:  No  Handed:  Right  AIMS (if indicated):     Assets:  Physical Health  Sleep:  Number of Hours: 5.75   Current Medications: Current Facility-Administered Medications  Medication Dose Route Frequency Provider Last Rate Last Dose  . alum & mag hydroxide-simeth (MAALOX/MYLANTA) 200-200-20 MG/5ML suspension 30 mL  30 mL Oral Q4H PRN Laverle Hobby, PA-C      . amantadine (SYMMETREL) capsule 100 mg  100 mg Oral BID Nena Polio, PA-C   100 mg at 09/01/13 1816  . buPROPion [facility]) tablet 100 mg  100 mg Oral BID Nena Polio, PA-C   100 mg at 09/01/13 1816  . ibuprofen (ADVIL,MOTRIN) tablet 400 mg  400 mg Oral Q6H PRN Nena Polio, PA-C   400 mg at 09/01/13 2107  . magnesium hydroxide (MILK OF MAGNESIA) suspension 30 mL  30 mL Oral Daily PRN Laverle Hobby, PA-C      . traZODone (DESYREL) tablet 50 mg  50 mg Oral QHS,MR X 1 Laverle Hobby, PA-C   50 mg at 09/01/13 2106    Lab Results: No results found for this or any previous visit (from the past 48 hour(s)).  Physical Findings: AIMS: Facial and Oral Movements Muscles of Facial Expression: None, normal Lips and  Perioral Area: None, normal Jaw: None, normal Tongue: None, normal,Extremity Movements Upper (arms, wrists, hands, fingers): None, normal Lower (legs, knees, ankles, toes): None, normal, Trunk Movements Neck, shoulders, hips: None, normal, Overall Severity Severity of abnormal movements (highest score from questions above): None, normal Incapacitation due to abnormal movements: None, normal Patient's awareness of abnormal movements (rate only patient's report): No Awareness, Dental Status Current problems with teeth and/or dentures?: Yes Does patient usually wear dentures?: Yes (didnt bring with her)  CIWA:    COWS:     Treatment Plan Summary: Daily contact with patient to assess and evaluate symptoms and  progress in treatment Medication management  Plan: 1. Continue crisis management and stabilization. 2. Medication management to reduce current symptoms to base line and improve patient's overall level of functioning 3. Treat health problems as indicated. 4. Develop treatment plan to decrease risk of relapse upon discharge and the need for     readmission. 5. Psycho-social education regarding relapse prevention and self care. 6. Health care follow up as needed for medical problems. 7. Continue home medications where appropriate. 8. Will continue to provide support and encouragement for this patient to pursue residential treatment plan.    Medical Decision Making Problem Points:  Established problem, stable/improving (1) Data Points:  Review or order clinical lab tests (1)  I certify that inpatient services furnished can reasonably be expected to improve the patient's condition.   MASHBURN,NEIL 09/01/2013, 9:31 PM   Attending Addendum: I have discussed this patient with the above provider. I have reviewed the history, physical exam, assessment and plan and agree with the above.   Coralyn Helling, M.D.  09/01/2013 11:14 PM

## 2013-09-01 NOTE — BHH Counselor (Signed)
Adult Psychosocial Assessment Update Interdisciplinary Team  Previous [facility] admissions/discharges:  Admissions Discharges  Date: 08/30/13 Date: d/c date currently unknown   Date: 06/23/13 Date: 06/28/13  Date: 05/14/13 Date: 05/17/13  Date: Date:  Date: Date:   Changes since the last Psychosocial Assessment (including adherence to outpatient mental health and/or substance abuse treatment, situational issues contributing to decompensation and/or relapse). [name] was admitted for suicidal ideation for continued crack cocaine use causing vasculitis. Patient is homeless and non compliant with follow up. She notes that she did well for a while staying clean but recently relapsed and now she has new lesions on her legs, ears, and face. She is tearful, helpless, hopeless, and states she doesn't want to go on living like this.   Pt was set up with [facility] for med management and assessment for therapy services during last admission but did not follow up. She had been living with a friend but is reporting as homeless currently. Pt reportedly came to ED complaining of SI but denies thoughts of SI or plan at this time.            Discharge Plan 1. Will you be returning to the same living situation after discharge?   Yes: No:      If no, what is your plan?    Pt reports that she does not want to continue to live the way she has been (homeless). Pt unsure about d/c plan at this time. CSW assessing.         2. Would you like a referral for services when you are discharged? Yes:     If yes, for what services?  No:       CSW assessing for appropriate referrals. Pt unsure if she wants i/p or o/p treatment. She had been set up with [facility] for med managment during last admission but reportedly did not go to follow-up.        Summary and Recommendations (to be completed by the evaluator) Pt is 52 year old female presenting to [facility] for substance abuse (crack cocaine), mood  stabilization, and passive SI. Pt reports that she is homeless and has been noncompliant with medication since her last d/c from [facility] in 06/2013. Recommendations for pt include: crisis stabilization, therapeutic milieu, encourage group attendance and participation, medication management for mood stabilization, and development of comprehensive mental wellness/sobriety plan. CSW assessing for appropriate referrals at this time.                        Signature:  Higinio Roger 09/01/2013 12:40 PM

## 2013-09-01 NOTE — Consult Note (Signed)
WOC wound consult note Reason for Consult: levimasole vasculitis. Pt reports she has not used in a "long time" however new lesions on her thighs that were not present at the time of her last admission. She was positive for multiple drugs at the time of this admission.  Pt well known to the [place] team, her face and right ear are healed and at one time they were completely necrotic.  She has some remaining necrosis on her left ear and the tip of her nose.  She has 4 new lesions on her thighs.  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  2 intact bulla on the left thigh that are very painful to the touch.  She also has some pain of the lesions on the right thigh but less intense than the left.  Drainage (amount, consistency, odor) none, pt reports odor at her nose tip, this is the necrotic flesh.  Periwound: intact some erythema surrounding the left thigh lesions. Dressing procedure/placement/frequency: silver antimicrobial gel to the lesions bilateral thighs, nose tip, left ear. Apply daily, cover the left thigh until the bulla rupture for pain control. Silver gel provided by [facility] nurse and left with RN in the medication room to be labeled with patient name. Kellie Simmering # provided for [facility] staff to obtain foam dressings needed to change in 3 days.   Discussed POC with patient and bedside nurse.  Re consult if needed, will not follow at this time. Thanks  Jaramie Bastos [place], [place] [phone])

## 2013-09-01 NOTE — Progress Notes (Signed)
D:  Patient up and visible in the milieu, but not attending groups.  Complained of pain in her left ear.  Seen today by the wound care nurse.  Antimicrobial gel in her medication cassette in the med room.  Affect somewhat blunted, but has brightened as the day progressed. A:  Medications given as prescribed.  Encouraged patient to attend groups.  Offered support and encouragement. R:  Patient is interacting well with staff and peers.  Has been cooperative and affect is brighter.  Safety is maintained.

## 2013-09-01 NOTE — Progress Notes (Signed)
DAR- Update note D: Pt observed sleeping in bed with eyes closed. RR even and unlabored. No distress noted  .  A: Q 15 minute checks were done for safety.  R: safety maintained on unit.

## 2013-09-01 NOTE — Progress Notes (Signed)
Adult Psychoeducational Group Note  Date:  09/01/2013 Time:  1:15PM  Group Topic/Focus:  Therapeutic Activity  Participation Level:  Did Not Attend  Additional Comments:  Pt opted not to attend the session.   [place] 09/01/2013, 2:38 PM

## 2013-09-02 DIAGNOSIS — F332 Major depressive disorder, recurrent severe without psychotic features: Principal | ICD-10-CM

## 2013-09-02 DIAGNOSIS — F1420 Cocaine dependence, uncomplicated: Secondary | ICD-10-CM

## 2013-09-02 NOTE — Progress Notes (Signed)
Psychoeducational Group Note  Date: 09/02/2013 Time:  1015  Group Topic/Focus:  Identifying Needs:   The focus of this group is to help patients identify their personal needs that have been historically problematic and identify healthy behaviors to address their needs.  Participation Level:  Active  Participation Quality:  Appropriate  Affect:  Appropriate  Cognitive:  Appropriate  Insight:  Improving  Engagement in Group:  Engaged  Additional Comments:    Paulino Rily

## 2013-09-02 NOTE — Progress Notes (Signed)
[facility] Group Notes:  (Nursing/MHT/Case Management/Adjunct)  Date:  09/02/2013  Time:  3:24 PM  Type of Therapy:  Psychoeducational Skills  Participation Level:  Active  Participation Quality:  Attentive  Affect:  Appropriate  Cognitive:  Appropriate  Insight:  Appropriate  Engagement in Group:  Engaged  Modes of Intervention:  Activity  Summary of Progress/Problems:  Clint Bolder 09/02/2013, 3:24 PM

## 2013-09-02 NOTE — Progress Notes (Signed)
 .  Psychoeducational Group Note    Date: 09/02/2013 Time: 0930  Goal Setting Purpose of Group: To be able to set a goal that is measurable and that can be accomplished in one day Participation Level:  Active  Participation Quality:  Attentive  Affect:  Appropriate  Cognitive:  Oriented  Insight:  Improving  Engagement in Group:  Engaged  Additional Comments:   Paulino Rily

## 2013-09-02 NOTE — Progress Notes (Signed)
D) Pt states that she is feeling better overall. Rates her depression and hopelessness both at a 0 and denies SI and HI. Has significant pain in her L ear. Was given Advil at 0818 today. Has attended the groups and interacts with select peers. A) Given support reassuance and praise along with encouragement. Encouaraged to work on her booklet today. R) Denies SI and HI.

## 2013-09-02 NOTE — Progress Notes (Signed)
D: pt seen in dayroom laughing and joking with others. Pt. Is cooperative. No complaints or signs of distress. Denies SI/HI/AVH. Pt. States her ears hurting rates pain a 5/10.  A: q 15 min safety. Support and encouragement offered. Medication given R: pt. Remains safe on the unit

## 2013-09-02 NOTE — Progress Notes (Signed)
[name], female   DOB: 1961-07-13, 52 y.o.   MRN: [id number] [facility] MD Progress Note  09/02/2013 1:50 PM [name]  MRN:  [id number] Subjective:  Patient states "I want to go home. I'm back on my medications and doing better. I can stay with a friend. I'm not having cravings to use drugs because they got me on some medicine."  Objective:  Patient requesting to speak with writer this morning as she is wanting to be discharged. She starts to cry when asked basic questions about her admission but denies feeling depressed. Patient minimizes her drug use and reasons for admission. She is observed wearing a pair of underwear on her head to hold her ear bandage in place.   Diagnosis:   DSM5:  Schizophrenia Disorders:  Obsessive-Compulsive Disorders:  Trauma-Stressor Disorders:  Substance/Addictive Disorders: Cocaine dependency  Depressive Disorders: Major Depressive Disorder - Severe (296.23)  AXIS I: Major Depression, Recurrent severe  AXIS II: Deferred  AXIS III:  Past Medical History   Diagnosis  Date   .  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 Followed by Dr. Louanne Skye   .  Hypertension    .  Cocaine abuse      ongoing with resultant vaculitis.   .  Tobacco abuse    .  Depression    .  Normocytic anemia      BL Hgb 9.8-12. Last anemia panel 04/2010 - showing Fe 19, ferritin 101. Pt on monthly B12 injections   .  Rheumatoid arthritis(714.0)      patient reported   .  Inflammatory arthritis     AXIS IV: economic problems, housing problems, occupational problems, problems related to legal system/crime and problems related to social environment  AXIS V: 41-50 serious symptoms ADL's:  Intact  Sleep: Good  Appetite:  Good  Suicidal Ideation:  Patient states they are decreasing  Homicidal Ideation:  denies AEB (as evidenced by):  Psychiatric Specialty Exam: Review of Systems  Constitutional: Negative.   HENT: Negative.   Eyes: Negative.   Respiratory:  Negative.   Cardiovascular: Negative.   Gastrointestinal: Negative.   Genitourinary: Negative.   Musculoskeletal: Negative.   Skin: Negative.   Neurological: Negative.   Endo/Heme/Allergies: Negative.   Psychiatric/Behavioral: Positive for depression and substance abuse. Negative for suicidal ideas, hallucinations and memory loss. The patient is nervous/anxious. The patient does not have insomnia.     Blood pressure 121/78, pulse 76, temperature 97.2 F (36.2 C), temperature source Oral, resp. rate 18, height 5' 2"  (1.575 m), weight 43.092 kg (95 lb).Body mass index is 17.37 kg/(m^2).  General Appearance: Disheveled  Eye Sport and exercise psychologist::  Fair  Speech:  Clear and Coherent  Volume:  Normal  Mood:  Anxious and Depressed  Affect:  Congruent  Thought Process:  Goal Directed  Orientation:  Full (Time, Place, and Person)  Thought Content:  WDL  Suicidal Thoughts:  No  Homicidal Thoughts:  No  Memory:  NA  Judgement:  Poor  Insight:  Lacking  Psychomotor Activity:  Normal  Concentration:  Poor  Recall:  Poor  Akathisia:  No  Handed:  Right  AIMS (if indicated):     Assets:  Physical Health  Sleep:  Number of Hours: 6.5   Current Medications: Current Facility-Administered Medications  Medication Dose Route Frequency Provider Last Rate Last Dose  . alum & mag hydroxide-simeth (MAALOX/MYLANTA) 200-200-20 MG/5ML suspension 30 mL  30 mL Oral Q4H PRN Laverle Hobby, PA-C      .  amantadine (SYMMETREL) capsule 100 mg  100 mg Oral BID Nena Polio, PA-C   100 mg at 09/02/13 0816  . buPROPion [facility]) tablet 100 mg  100 mg Oral BID Nena Polio, PA-C   100 mg at 09/02/13 0816  . ibuprofen (ADVIL,MOTRIN) tablet 400 mg  400 mg Oral Q6H PRN Nena Polio, PA-C   400 mg at 09/02/13 0818  . magnesium hydroxide (MILK OF MAGNESIA) suspension 30 mL  30 mL Oral Daily PRN Laverle Hobby, PA-C      . traZODone (DESYREL) tablet 50 mg  50 mg Oral QHS,MR X 1 Spencer E Simon, PA-C   50 mg at 09/01/13  2248    Lab Results: No results found for this or any previous visit (from the past 48 hour(s)).  Physical Findings: AIMS: Facial and Oral Movements Muscles of Facial Expression: None, normal Lips and Perioral Area: None, normal Jaw: None, normal Tongue: None, normal,Extremity Movements Upper (arms, wrists, hands, fingers): None, normal Lower (legs, knees, ankles, toes): None, normal, Trunk Movements Neck, shoulders, hips: None, normal, Overall Severity Severity of abnormal movements (highest score from questions above): None, normal Incapacitation due to abnormal movements: None, normal Patient's awareness of abnormal movements (rate only patient's report): No Awareness, Dental Status Current problems with teeth and/or dentures?: No Does patient usually wear dentures?: No  CIWA:    COWS:     Treatment Plan Summary: Daily contact with patient to assess and evaluate symptoms and progress in treatment Medication management  Plan: 1. Continue crisis management and stabilization. 2. Medication management to reduce current symptoms to base line and improve patient's overall level of functioning 3. Treat health problems as indicated. Continue Wellbutrin 100 mg BID for depression and Symmetrel 100 mg BID for cocaine cravings.  4. Develop treatment plan to decrease risk of relapse upon discharge and the need for readmission. 5. Psycho-social education regarding relapse prevention and self care. 6. Health care follow up as needed for medical problems. Wound care consult completed for treatment of vasculitis to bilateral thighs, nose tip and left ear. Silver antimicrobial gel is being applied daily by nursing.  7. Continue home medications where appropriate. 8. Will continue to provide support and encouragement for this patient to pursue residential treatment plan.  Medical Decision Making Problem Points:  Established problem, stable/improving (1) Data Points:  Review or order clinical  lab tests (1)  I certify that inpatient services furnished can reasonably be expected to improve the patient's condition.   DAVIS, LAURA NP-C 09/02/2013, 1:50 PM   Attending Addendum: I have discussed this patient with the above provider. I have reviewed the history, physical exam, assessment and plan and agree with the above.  Coralyn Helling, M.D.  09/02/2013 10:38 PM

## 2013-09-02 NOTE — [facility] Group Notes (Signed)
[place] Group Notes: (Clinical Social Work)   09/02/2013      Type of Therapy:  Group Therapy   Participation Level:  Did Not Attend    Selmer Dominion, LCSW 09/02/2013, 4:12 PM

## 2013-09-03 NOTE — Progress Notes (Signed)
[name], female   DOB: 04-10-1961, 52 y.o.   MRN: [id number]  [facility] MD Progress Note  09/03/2013 3:28 PM [name]  MRN:  [id number] Subjective:  Patient states "I'm really hoping to go home tomorrow. I have a place to go and want to be able to pay half the rent tomorrow. [place] I'm glad that I am not going back there. I'm just upset that people keep saying that my ears are messed up from cocaine. I really think it might be the animals under the bridge bothering me. But they see the cocaine in my system every time I come in."  Objective:  The patient is visible and active on the unit. She continues to be fixated on leaving the hospital. Patient has been noncompliant with follow up in the past instead choosing to use cocaine. However, patient is now reporting that she has a place to stay and will be better able to stay on her medications. The patient shows writer her left ear so that I can see that it is healing well.   Diagnosis:   DSM5:  Schizophrenia Disorders:  Obsessive-Compulsive Disorders:  Trauma-Stressor Disorders:  Substance/Addictive Disorders: Cocaine dependency  Depressive Disorders: Major Depressive Disorder - Severe (296.23)  AXIS I: Major Depression, Recurrent severe  AXIS II: Deferred  AXIS III:  Past Medical History   Diagnosis  Date   .  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 Followed by Dr. Louanne Skye   .  Hypertension    .  Cocaine abuse      ongoing with resultant vaculitis.   .  Tobacco abuse    .  Depression    .  Normocytic anemia      BL Hgb 9.8-12. Last anemia panel 04/2010 - showing Fe 19, ferritin 101. Pt on monthly B12 injections   .  Rheumatoid arthritis(714.0)      patient reported   .  Inflammatory arthritis     AXIS IV: economic problems, housing problems, occupational problems, problems related to legal system/crime and problems related to social environment  AXIS V: 41-50 serious  symptoms ADL's:  Intact  Sleep: Good  Appetite:  Good  Suicidal Ideation:  Denies  Homicidal Ideation:  denies AEB (as evidenced by):  Psychiatric Specialty Exam: Review of Systems  Constitutional: Negative.   HENT: Negative.   Eyes: Negative.   Respiratory: Negative.   Cardiovascular: Negative.   Gastrointestinal: Negative.   Genitourinary: Negative.   Musculoskeletal: Negative.   Skin: Negative.   Neurological: Negative.   Endo/Heme/Allergies: Negative.   Psychiatric/Behavioral: Positive for depression and substance abuse. Negative for suicidal ideas, hallucinations and memory loss. The patient is nervous/anxious. The patient does not have insomnia.     Blood pressure 123/68, pulse 61, temperature 97.4 F (36.3 C), temperature source Oral, resp. rate 18, height 5' 2"  (1.575 m), weight 43.092 kg (95 lb).Body mass index is 17.37 kg/(m^2).  General Appearance: Disheveled  Eye Sport and exercise psychologist::  Fair  Speech:  Clear and Coherent  Volume:  Normal  Mood:  Anxious and Depressed  Affect:  Congruent  Thought Process:  Goal Directed  Orientation:  Full (Time, Place, and Person)  Thought Content:  WDL  Suicidal Thoughts:  No  Homicidal Thoughts:  No  Memory:  NA  Judgement:  Poor  Insight:  Lacking  Psychomotor Activity:  Normal  Concentration:  Poor  Recall:  Poor  Akathisia:  No  Handed:  Right  AIMS (if indicated):     Assets:  Physical Health  Sleep:  Number of Hours: 6.75   Current Medications: Current Facility-Administered Medications  Medication Dose Route Frequency Provider Last Rate Last Dose  . alum & mag hydroxide-simeth (MAALOX/MYLANTA) 200-200-20 MG/5ML suspension 30 mL  30 mL Oral Q4H PRN Laverle Hobby, PA-C      . amantadine (SYMMETREL) capsule 100 mg  100 mg Oral BID Nena Polio, PA-C   100 mg at 09/03/13 0816  . buPROPion [facility]) tablet 100 mg  100 mg Oral BID Nena Polio, PA-C   100 mg at 09/03/13 0816  . ibuprofen (ADVIL,MOTRIN) tablet 400 mg   400 mg Oral Q6H PRN Nena Polio, PA-C   400 mg at 09/03/13 1405  . magnesium hydroxide (MILK OF MAGNESIA) suspension 30 mL  30 mL Oral Daily PRN Laverle Hobby, PA-C      . traZODone (DESYREL) tablet 50 mg  50 mg Oral QHS,MR X 1 Laverle Hobby, PA-C   50 mg at 09/02/13 2216    Lab Results: No results found for this or any previous visit (from the past 48 hour(s)).  Physical Findings: AIMS: Facial and Oral Movements Muscles of Facial Expression: None, normal Lips and Perioral Area: None, normal Jaw: None, normal Tongue: None, normal,Extremity Movements Upper (arms, wrists, hands, fingers): None, normal Lower (legs, knees, ankles, toes): None, normal, Trunk Movements Neck, shoulders, hips: None, normal, Overall Severity Severity of abnormal movements (highest score from questions above): None, normal Incapacitation due to abnormal movements: None, normal Patient's awareness of abnormal movements (rate only patient's report): No Awareness, Dental Status Current problems with teeth and/or dentures?: No Does patient usually wear dentures?: No  CIWA:    COWS:     Treatment Plan Summary: Daily contact with patient to assess and evaluate symptoms and progress in treatment Medication management  Plan: 1. Continue crisis management and stabilization. 2. Medication management to reduce current symptoms to base line and improve patient's overall level of functioning 3. Treat health problems as indicated. Continue Wellbutrin 100 mg BID for depression and Symmetrel 100 mg BID for cocaine cravings.  4. Develop treatment plan to decrease risk of relapse upon discharge and the need for readmission. 5. Psycho-social education regarding relapse prevention and self care. 6. Health care follow up as needed for medical problems. Wound care consult completed for treatment of vasculitis to bilateral thighs, nose tip and left ear. Silver antimicrobial gel is being applied daily by nursing.  7.  Anticipate d/c in 1-2 days.  8. Will continue to provide support and encouragement for this patient to pursue residential treatment plan.  Medical Decision Making Problem Points:  Established problem, stable/improving (1) Data Points:  Review or order clinical lab tests (1)  I certify that inpatient services furnished can reasonably be expected to improve the patient's condition.   Elmarie Shiley NP-C 09/03/2013, 3:28 PM  Attending Addendum: . I have discussed this patient with the above provider. I have reviewed the history, physical exam, assessment and plan and agree with the above.  Coralyn Helling, M.D.  09/03/2013 11:31 PM

## 2013-09-03 NOTE — Progress Notes (Signed)
Psychoeducational Group Note  Date: 09/03/2013 Time:  0930 Group Topic/Focus:  Gratefulness:  The focus of this group is to help patients identify what two things they are most grateful for in their lives. What helps ground them and to center them on their work to their recovery.  Participation Level:  Active  Participation Quality:  Appropriate  Affect:  Appropriate  Cognitive:  Oriented  Insight:  Improving  Engagement in Group:  Engaged  Additional Comments:    Paulino Rily

## 2013-09-03 NOTE — Progress Notes (Signed)
[place] Group Notes:  (Nursing/MHT/Case Management/Adjunct)  Date:  09/03/2013  Time:  3:48 PM  Type of Therapy:  Psychoeducational Skills  Participation Level:  Active  Participation Quality:  Appropriate  Affect:  Appropriate  Cognitive:  Appropriate  Insight:  Appropriate  Engagement in Group:  Engaged  Modes of Intervention:  Activity  Summary of Progress/Problems:  Clint Bolder 09/03/2013, 3:48 PM

## 2013-09-03 NOTE — Progress Notes (Signed)
Psychoeducational Group Note  Date:  09/03/2013 Time:  1015  Group Topic/Focus:  Making Healthy Choices:   The focus of this group is to help patients identify negative/unhealthy choices they were using prior to admission and identify positive/healthier coping strategies to replace them upon discharge.  Participation Level:  Active  Participation Quality:  Appropriate  Affect:  Appropriate  Cognitive:  Oriented  Insight:  Improving  Engagement in Group:  Engaged  Additional Comments:    Paulino Rily 09/03/2013

## 2013-09-03 NOTE — Progress Notes (Signed)
Writer observed patient up in the dayroom earlier watching tv and has been laughing and talking with select peers. Patient c/o knee pain and requested her bandage be changed on her left thigh which writer did. Patient is hopeful to discharge on Monday and plans to stay with a good friend and she reports that her friend does not use. Patient denies si/hi/a/visual hallucinations. Safety maintained on unit with 15 min checks.

## 2013-09-03 NOTE — Progress Notes (Signed)
Adult Psychoeducational Group Note  Date:  09/03/2013 Time:  08:00pm Group Topic/Focus:  Wrap-Up Group:   The focus of this group is to help patients review their daily goal of treatment and discuss progress on daily workbooks.  Participation Level:  Active  Participation Quality:  Appropriate and Attentive  Affect:  Appropriate  Cognitive:  Alert and Appropriate  Insight: Appropriate  Engagement in Group:  Engaged  Modes of Intervention:  Discussion and Education  Additional Comments:  Pt attended and participated in group. Discussion was on rules of the unit and asking how their day went.  Pt stated her day was good because she had a chance to go outside and get fresh air and her medications are working.  Marlowe Shores D 09/03/2013, 9:45 PM

## 2013-09-03 NOTE — Progress Notes (Signed)
D Shalon cont to become stronger and more mentally sound every day. She takes her meds as ordered. Her woulds are cleaned and wound care performed by pt ( with nurse handing her supplies) twice today, with pt tolerating this well.    A Pt requested , and was given, advil, for c/o pain on her left ear and her right leg. She stated relief afterwards.    R Safety is in place. Pt denies SI within the past 24 hrs and rates her depression and hopelessness "3/3"peri-orbital cellulitis and safety cont.

## 2013-09-03 NOTE — [facility] Group Notes (Signed)
[place] Group Notes:  (Clinical Social Work)  09/03/2013   1:15-2:20PM  Summary of Progress/Problems:   The main focus of today's process group was to discuss about what constitutes a healthy support versus an unhealthy support, and to generate ideas on increasing supports.  An emphasis was placed on using counselor, doctor, therapy groups, 12-step groups, and problem-specific support groups to expand supports.  Several patients were quite disruptive, turning the subject again and again, so much of group was spent in redirecting.  Type of Therapy:  Process Group  Participation Level:  Active  Participation Quality:  Attentive and Sharing  Affect:  Blunted  Cognitive:  Appropriate and Oriented  Insight:  Engaged  Engagement in Therapy:  Engaged  Modes of Intervention:  Education,  Support and [employer], LCSW 09/03/2013, 4:00pm

## 2013-09-03 NOTE — Progress Notes (Signed)
[place] Group Notes:  (Nursing/MHT/Case Management/Adjunct)  Date:  09/02/2013 Time:  8:00 p.m.   Type of Therapy:  Psychoeducational Skills  Participation Level:  Active  Participation Quality:  Appropriate  Affect:  Appropriate  Cognitive:  Appropriate  Insight:  Good  Engagement in Group:  Engaged  Modes of Intervention:  Education  Summary of Progress/Problems: The patient shared in group this evening that she had a good day. She attributed her positive day to having slept well and because she spoke with a friend of hers. Her goal for tomorrow is to speak with the doctor about getting discharged on Monday.   Gram Siedlecki S 09/03/2013, 1:26 AM

## 2013-09-04 NOTE — Progress Notes (Signed)
Adult Psychoeducational Group Note  Date:  09/04/2013 Time:  8:58 PM  Group Topic/Focus:  Wrap-Up Group:   The focus of this group is to help patients review their daily goal of treatment and discuss progress on daily workbooks.  Participation Level:  Active  Participation Quality:  Appropriate  Affect:  Appropriate  Cognitive:  Appropriate  Insight: Appropriate  Engagement in Group:  Engaged  Modes of Intervention:  Support  Additional Comments:  Pt stated that one good thing that happened is that she can go home tomorrow. Stated also that "noone is gonna steal her money" stated one good thing that she learned today was that she can't get around the doctors no matter what she tries.   [name] 09/04/2013, 8:58 PM

## 2013-09-04 NOTE — Progress Notes (Signed)
[facility] MD Progress Note  09/04/2013 1:00 PM [name]  MRN:  [id number] Subjective: [name] was seen in [address] and sitting in a chair and has been crying with tears. Patient states, I have a place to go and want to be able to pay half the rent tomorrow. Patient states 'm just upset that people keep saying that my ears are messed up from cocaine.  Objective: She has been fixated on leaving the hospital. Patient has been noncompliant with follow up in the past instead choosing to use cocaine. However, patient is now reporting that she has a place to stay and will be better able to stay on her medications.   Diagnosis:   DSM5:  Schizophrenia Disorders:  Obsessive-Compulsive Disorders:  Trauma-Stressor Disorders:  Substance/Addictive Disorders: Cocaine dependency  Depressive Disorders: Major Depressive Disorder - Severe (296.23)  AXIS I: Major Depression, Recurrent severe  AXIS II: Deferred  AXIS III:  Past Medical History   Diagnosis  Date   .  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 Followed by Dr. Louanne Skye   .  Hypertension    .  Cocaine abuse      ongoing with resultant vaculitis.   .  Tobacco abuse    .  Depression    .  Normocytic anemia      BL Hgb 9.8-12. Last anemia panel 04/2010 - showing Fe 19, ferritin 101. Pt on monthly B12 injections   .  Rheumatoid arthritis(714.0)      patient reported   .  Inflammatory arthritis     AXIS IV: economic problems, housing problems, occupational problems, problems related to legal system/crime and problems related to social environment  AXIS V: 41-50 serious symptoms ADL's:  Intact  Sleep: Good  Appetite:  Good  Suicidal Ideation:  Denies  Homicidal Ideation:  denies AEB (as evidenced by):  Psychiatric Specialty Exam: Review of Systems  Constitutional: Negative.   HENT: Negative.   Eyes: Negative.   Respiratory: Negative.   Cardiovascular: Negative.   Gastrointestinal: Negative.   Genitourinary: Negative.    Musculoskeletal: Negative.   Skin: Negative.   Neurological: Negative.   Endo/Heme/Allergies: Negative.   Psychiatric/Behavioral: Positive for depression and substance abuse. Negative for suicidal ideas, hallucinations and memory loss. The patient is nervous/anxious. The patient does not have insomnia.     Blood pressure 133/80, pulse 65, temperature 97.5 F (36.4 C), temperature source Oral, resp. rate 16, height 5' 2"  (1.575 m), weight 43.092 kg (95 lb).Body mass index is 17.37 kg/(m^2).  General Appearance: Disheveled  Eye Sport and exercise psychologist::  Fair  Speech:  Clear and Coherent  Volume:  Normal  Mood:  Anxious and Depressed  Affect:  Congruent  Thought Process:  Goal Directed  Orientation:  Full (Time, Place, and Person)  Thought Content:  WDL  Suicidal Thoughts:  No  Homicidal Thoughts:  No  Memory:  NA  Judgement:  Poor  Insight:  Lacking  Psychomotor Activity:  Normal  Concentration:  Poor  Recall:  Poor  Akathisia:  No  Handed:  Right  AIMS (if indicated):     Assets:  Physical Health  Sleep:  Number of Hours: 6.5   Current Medications: Current Facility-Administered Medications  Medication Dose Route Frequency Provider Last Rate Last Dose  . alum & mag hydroxide-simeth (MAALOX/MYLANTA) 200-200-20 MG/5ML suspension 30 mL  30 mL Oral Q4H PRN Laverle Hobby, PA-C      . amantadine (SYMMETREL) capsule 100 mg  100 mg Oral BID Milta Deiters  Mashburn, PA-C   100 mg at 09/04/13 0806  . buPROPion [facility]) tablet 100 mg  100 mg Oral BID Nena Polio, PA-C   100 mg at 09/04/13 9012  . ibuprofen (ADVIL,MOTRIN) tablet 400 mg  400 mg Oral Q6H PRN Nena Polio, PA-C   400 mg at 09/04/13 0810  . magnesium hydroxide (MILK OF MAGNESIA) suspension 30 mL  30 mL Oral Daily PRN Laverle Hobby, PA-C      . traZODone (DESYREL) tablet 50 mg  50 mg Oral QHS,MR X 1 Laverle Hobby, PA-C   50 mg at 09/03/13 2157    Lab Results: No results found for this or any previous visit (from the past 48  hour(s)).  Physical Findings: AIMS: Facial and Oral Movements Muscles of Facial Expression: None, normal Lips and Perioral Area: None, normal Jaw: None, normal Tongue: None, normal,Extremity Movements Upper (arms, wrists, hands, fingers): None, normal Lower (legs, knees, ankles, toes): None, normal, Trunk Movements Neck, shoulders, hips: None, normal, Overall Severity Severity of abnormal movements (highest score from questions above): None, normal Incapacitation due to abnormal movements: None, normal Patient's awareness of abnormal movements (rate only patient's report): No Awareness, Dental Status Current problems with teeth and/or dentures?: No Does patient usually wear dentures?: No  CIWA:    COWS:     Treatment Plan Summary: Daily contact with patient to assess and evaluate symptoms and progress in treatment Medication management  Plan: 1. Continue crisis management and stabilization. 2. Medication management to reduce current symptoms to base line and improve patient's overall level of functioning 3. Treat health problems as indicated.  Continue Wellbutrin 100 mg BID for depression and Symmetrel 100 mg BID for cocaine cravings.  4. Develop treatment plan to decrease risk of relapse upon discharge and the need for readmission. 5. Psycho-social education regarding relapse prevention and self care. 6. Health care follow up as needed for medical problems. Wound care consult completed for treatment of vasculitis to bilateral thighs, nose tip and left ear. Silver antimicrobial gel is being applied daily by nursing.  7.  Continue to provide support and encouragement for this patient to pursue residential treatment plan. 8. Disposition plans in progress  Medical Decision Making Problem Points:  Established problem, stable/improving (1) Data Points:  Review or order clinical lab tests (1)  I certify that inpatient services furnished can reasonably be expected to improve the  patient's condition.   Durward Parcel , MD  09/04/2013, 1:00 PM

## 2013-09-04 NOTE — Progress Notes (Signed)
[name], female   DOB: 1961-01-17, 52 y.o.   MRN: [id number] D- Patient reports she slept well and her appetite is good.  Her energy level is normal  and her  ability to pay attention is improving.  She denies thoughts of SI or HI .   She was hoping to go home today and has been upset and tearful throughout the shift because she is not leaving.  Patient is very worried abut her check which is due today .  She is afraid that someone will get it.  A- Listened to patient's concerns.  R- patient continues to be sad and upset.  She has not needed her ear rewrapped today.  Both legs were redressed.  Blisters on both legs have broken and sores are healing with minimal dark red drainage.

## 2013-09-04 NOTE — BHH Group Notes (Signed)
[facility] LCSW Group Therapy          Overcoming Obstacles       1:15 -2:30        09/04/2013   4:18 PM     Type of Therapy:  Group Therapy  Participation Level:  Appropriate  Participation Quality:  Appropriate  Affect:  Appropriate,  Cognitive:  Attentive Appropriate  Insight: Developing/Improving   Engagement in Therapy: Developing/Imprvoing  Modes of Intervention:  Discussion Exploration  Education Rapport BuildingProblem-Solving Support  Summary of Progress/Problems:  The main focus of today's group was overcoming obstacles.  Patient gave feedback to group member but did not want to discuss the obstacle she needs to overcome.    [name] 09/04/2013    4:18 PM

## 2013-09-04 NOTE — Tx Team (Signed)
Interdisciplinary Treatment Plan Update   Date Reviewed:  09/04/2013  Time Reviewed:  8:38 AM  Progress in Treatment:   Attending groups: Yes Participating in groups: Yes Taking medication as prescribed: Yes  Tolerating medication: Yes Family/Significant other contact made: No, but will ask patient for consent for collateral contact Patient understands diagnosis: Yes  Discussing patient identified problems/goals with staff: Yes Medical problems stabilized or resolved: Yes Denies suicidal/homicidal ideation: Yes Patient has not harmed self or others: Yes  For review of initial/current patient goals, please see plan of care.  Estimated Length of Stay:  1-2 days  Reasons for Continued Hospitalization:  Anxiety Depression Medication stabilization   New Problems/Goals identified:    Discharge Plan or Barriers:   Home with outpatient follow up at [name] was admitted for suicidal ideation for continued crack cocaine use causing vasculitis. Patient is homeless and non compliant with follow up. She notes that she did well for a while staying clean but recently relapsed and now she has new lesions on her legs, ears, and face. She is tearful, helpless, hopeless, and states she doesn't want to go on living like this.  Attendees:  Patient:  09/04/2013 8:38 AM   Signature: Mylinda Latina, MD 09/04/2013 8:38 AM  Signature:  Darrick Penna  09/04/2013 8:38 AM  Signature: 09/04/2013 8:38 AM  Signature:Beverly Danelle Earthly, RN 09/04/2013 8:38 AM  Signature:  Thurnell Garbe RN 09/04/2013 8:38 AM  Signature:  Joette Catching, LCSW 09/04/2013 8:38 AM  Signature:  Fredrich Birks, Team Lead [facility]  09/04/2013 8:38 AM  Signature: Corky Sing Coordinator 09/04/2013 8:38 AM  Signature:   09/04/2013 8:38 AM  Signature:  09/04/2013  8:38 AM  Signature:   Lars Pinks, RN [facility] 09/04/2013  8:38 AM  Signature:   09/04/2013  8:38 AM    Scribe for Treatment Team:   Joette Catching,   09/04/2013 8:38 AM

## 2013-09-04 NOTE — Progress Notes (Signed)
Adult Psychoeducational Group Note  Date:  09/04/2013 Time:  11:00am Group Topic/Focus:  Wellness Toolbox:   The focus of this group is to discuss various aspects of wellness, balancing those aspects and exploring ways to increase the ability to experience wellness.  Patients will create a wellness toolbox for use upon discharge.  Participation Level:  Did Not Attend  Participation Quality:    Affect:   Cognitive:    Insight:   Engagement in Group:    Modes of Intervention:    Additional Comments:  Pt did not attend group  Marlowe Shores D 09/04/2013, 1:27 PM

## 2013-09-05 MED ORDER — BUPROPION HCL 100 MG PO TABS: 100.0000 mg | ORAL_TABLET | Freq: Two times a day (BID) | ORAL | Status: DC

## 2013-09-05 MED ORDER — AMANTADINE HCL 100 MG PO CAPS: 100.0000 mg | ORAL_CAPSULE | Freq: Two times a day (BID) | ORAL | Status: DC

## 2013-09-05 NOTE — BHH Suicide Risk Assessment (Signed)
Suicide Risk Assessment  Discharge Assessment     Demographic Factors:  Adolescent or young adult, Low socioeconomic status and Unemployed  Mental Status Per Nursing Assessment::   On Admission:  NA  Current Mental Status by Physician: Patient is calm and cooperative. Patient has no abnormal psychomotor activity and good eye contact. Patient has good mood with the appropriate and bright affect. Patient has normal speech and thought process. Patient has no suicidal or homicidal ideations intentions or plans. Patient has no evidence of psychotic symptoms.  Loss Factors: Financial problems/change in socioeconomic status  Historical Factors: Family history of mental illness or substance abuse and Impulsivity  Risk Reduction Factors:   Sense of responsibility to family, Religious beliefs about death, Living with another person, especially a relative and Positive coping skills or problem solving skills  Continued Clinical Symptoms:  Depression:   Comorbid alcohol abuse/dependence Impulsivity Recent sense of peace/wellbeing Alcohol/Substance Abuse/Dependencies Unstable or Poor Therapeutic Relationship Previous Psychiatric Diagnoses and Treatments Medical Diagnoses and Treatments/Surgeries  Cognitive Features That Contribute To Risk:  Polarized thinking    Suicide Risk:  Minimal: No identifiable suicidal ideation.  Patients presenting with no risk factors but with morbid ruminations; may be classified as minimal risk based on the severity of the depressive symptoms  Discharge Diagnoses:   AXIS I:  Major Depression, Recurrent severe, Substance Induced Mood Disorder and Cocaine abuse vs dependence AXIS II:  Deferred AXIS III:   Past Medical History  Diagnosis Date  . 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 Followed by Dr. Louanne Skye  . Hypertension   . Cocaine abuse     ongoing with resultant vaculitis.  . Tobacco abuse   . Depression   . Normocytic anemia     BL Hgb 9.8-12. Last  anemia panel 04/2010 - showing Fe 19, ferritin 101.  Pt on monthly B12 injections  . Rheumatoid arthritis(714.0)     patient reported  . Inflammatory arthritis    AXIS IV:  economic problems, housing problems, other psychosocial or environmental problems, problems related to social environment and problems with primary support group AXIS V:  61-70 mild symptoms  Plan Of Care/Follow-up recommendations:  Activity:  As tolerated Diet:  Regular  Is patient on multiple antipsychotic therapies at discharge:  No   Has Patient had three or more failed trials of antipsychotic monotherapy by history:  No  Recommended Plan for Multiple Antipsychotic Therapies: NA  Durward Parcel., M.D. 09/05/2013, 12:17 PM

## 2013-09-05 NOTE — Progress Notes (Signed)
  D: Pt was laughing and talking with her peers prior to the assessment. During the assessment pt stated, "I had a bad day today, because I thought I was leaving today." Pt stated, " I was depressed when I first came in because I was hearing voices". States that she doesn't hear them at this time, but is "not sure of the future". Pt has worked out living arrangements with some friends and will pay them half on the rent.  Pt became tearful while discussing the death of her boyfriend 2 months ago. Stated though they were homeless, that's when she was most happy.  A:  Support and encouragement was offered. 15 min checks continued for safety.  R: Pt remains safe.

## 2013-09-05 NOTE — BHH Suicide Risk Assessment (Signed)
[facility] INPATIENT:  Family/Significant Other Suicide Prevention Education  Suicide Prevention Education:  ducation Completed; Lacey Jensen, Friend, [phone]; has been identified by the patient as the family member/significant other with whom the patient will be residing, and identified as the person(s) who will aid the patient in the event of a mental health crisis (suicidal ideations/suicide attempt).  With written consent from the patient, the family member/significant other has been provided the following suicide prevention education, prior to the and/or following the discharge of the patient.  The suicide prevention education provided includes the following:  Suicide risk factors  Suicide prevention and interventions  National Suicide Hotline telephone number  [facility] assessment telephone number  [facility] Assistance [place] and/or Residential Mobile Crisis Unit telephone number  Request made of family/significant other to:  Remove weapons (e.g., guns, rifles, knives), all items previously/currently identified as safety concern.  Friend advised patient does not have access to guns.  Remove drugs/medications (over-the-counter, prescriptions, illicit drugs), all items previously/currently identified as a safety concern.  The family member/significant other verbalizes understanding of the suicide prevention education information provided.  The family member/significant other agrees to remove the items of safety concern listed above.  Tionne Dayhoff Hairston 09/05/2013, 11:01 AM

## 2013-09-05 NOTE — Discharge Summary (Signed)
Physician Discharge Summary Note  Patient:  [name] is an 52 y.o., female MRN:  [id number] DOB:  Nov 20, 1960 Patient phone:  [phone] (home)  Patient address:   [address] [place] [address],   Date of Admission:  08/30/2013 Date of Discharge:  09/05/2013  Reason for Admission:  MDD with cocaine dependence   Discharge Diagnoses: Active Problems:   MDD (major depressive disorder)  ROS  DSM5: DSM5:  Schizophrenia Disorders:  Obsessive-Compulsive Disorders:  Trauma-Stressor Disorders:  Substance/Addictive Disorders: Cocaine dependency  Depressive Disorders: Major Depressive Disorder - Severe (296.23)  AXIS I: Major Depression, Recurrent severe  AXIS II: Deferred  AXIS III:  Past Medical History   Diagnosis  Date   .  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 Followed by Dr. Louanne Skye   .  Hypertension    .  Cocaine abuse      ongoing with resultant vaculitis.   .  Tobacco abuse    .  Depression    .  Normocytic anemia      BL Hgb 9.8-12. Last anemia panel 04/2010 - showing Fe 19, ferritin 101. Pt on monthly B12 injections   .  Rheumatoid arthritis(714.0)      patient reported   .  Inflammatory arthritis     AXIS IV: economic problems, housing problems, occupational problems, problems related to legal system/crime and problems related to social environment  AXIS V: 41-50 serious symptoms     Level of Care:  OP  Hospital Course:      Walter was admitted for suicidal ideation for continued crack cocaine use causing vasculitis. Patient is homeless and non compliant with follow up. She notes that she did well for a while staying clean but recently relapsed and now she has new lesions on her legs, ears, and face.       Meredyth was admitted to the adult unit where she was evaluated and her symptoms were identified. Medication management was discussed and implemented. She was encouraged to participate in unit programming. Medical problems were identified and  treated appropriately. Home medication was restarted as needed.                  Varsha was evaluated each day by a clinical provider to ascertain the patient's response to treatment.  Improvement was noted by the patient's report of decreasing symptoms, improved sleep and appetite, affect, medication tolerance, behavior, and participation in unit programming.       [place] was asked each day to complete a self inventory noting mood, mental status, pain, new symptoms, anxiety and concerns.         She responded well to medication and being in a therapeutic and supportive environment. Positive and appropriate behavior was noted and the patient was motivated for recovery.      Kadince initially worked closely with the treatment team and case manager to develop a discharge plan with appropriate goals. She stated that she hoped to go to a residential treatment program. However as she felt better she began to waiver on this decision and ultimately decided against going to an RTC         By the day of discharge she was in much improved condition than upon admission.  Symptoms were reported as significantly decreased or resolved completely. The patient denied SI/HI and voiced no AVH. She was motivated to continue taking medication with a goal of continued improvement in mental health.  ARAINA BUTRICK was discharged home with a plan to follow up as noted below.   Consults:  Wound care  Significant Diagnostic Studies:  labs: CBC,CMP, UA, UDS, BAL Discharge Vitals:   Blood pressure 133/75, pulse 75, temperature 98.1 F (36.7 C), temperature source Oral, resp. rate 16, height 5' 2"  (1.575 m), weight 43.092 kg (95 lb). Body mass index is 17.37 kg/(m^2). Lab Results:   No results found for this or any previous visit (from the past 72 hour(s)).  Physical Findings: AIMS: Facial and Oral Movements Muscles of Facial Expression: None, normal Lips and Perioral Area: None, normal Jaw: None, normal Tongue: None,  normal,Extremity Movements Upper (arms, wrists, hands, fingers): None, normal Lower (legs, knees, ankles, toes): None, normal, Trunk Movements Neck, shoulders, hips: None, normal, Overall Severity Severity of abnormal movements (highest score from questions above): None, normal Incapacitation due to abnormal movements: None, normal Patient's awareness of abnormal movements (rate only patient's report): No Awareness, Dental Status Current problems with teeth and/or dentures?: No Does patient usually wear dentures?: No  CIWA:  CIWA-Ar Total: 1 COWS:  COWS Total Score: 1  Psychiatric Specialty Exam: See Psychiatric Specialty Exam and Suicide Risk Assessment completed by Attending Physician prior to discharge.  Discharge destination:  Home  Is patient on multiple antipsychotic therapies at discharge:  No   Has Patient had three or more failed trials of antipsychotic monotherapy by history:  No  Recommended Plan for Multiple Antipsychotic Therapies: NA  Discharge Orders   Future Orders Complete By Expires   Diet - low sodium heart healthy  As directed    Discharge instructions  As directed    Comments:     Take all of your medications as directed. Be sure to keep all of your follow up appointments.  If you are unable to keep your follow up appointment, call your Doctor's office to let them know, and reschedule.  Make sure that you have enough medication to last until your appointment. Be sure to get plenty of rest. Going to bed at the same time each night will help. Try to avoid sleeping during the day.  Increase your activity as tolerated. Regular exercise will help you to sleep better and improve your mental health. Eating a heart healthy diet is recommended. Try to avoid salty or fried foods. Be sure to avoid all alcohol and illegal drugs.   Increase activity slowly  As directed        Medication List    STOP taking these medications       aspirin EC 325 MG tablet      citalopram 20 MG tablet  Commonly known as:  CELEXA     oxyCODONE 5 MG immediate release tablet  Commonly known as:  Oxy IR/ROXICODONE      TAKE these medications     Indication   amantadine 100 MG capsule  Commonly known as:  SYMMETREL  Take 1 capsule (100 mg total) by mouth 2 (two) times daily. For cravings of cocaine.   Indication:  cocaine cravings     buPROPion 100 MG tablet  Commonly known as:  WELLBUTRIN  Take 1 tablet (100 mg total) by mouth 2 (two) times daily. For depression related to cocaine use.   Indication:  Major Depressive Disorder     folic acid 1 MG tablet  Commonly known as:  FOLVITE  Take 1 tablet (1 mg total) by mouth daily. For low folic acid.   Indication:  Deficiency of Folic Acid in  the Diet     lisinopril 10 MG tablet  Commonly known as:  PRINIVIL,ZESTRIL  Take 1 tablet (10 mg total) by mouth daily. For hypertension.   Indication:  High Blood Pressure     neomycin-bacitracin-polymyxin Oint  Commonly known as:  NEOSPORIN  Apply 1 application topically 2 (two) times daily. For wounds to face, lips and ears.      traZODone 50 MG tablet  Commonly known as:  DESYREL  Take one tablet at bedtime for insomnia.   Indication:  Trouble Sleeping           Follow-up Information   Follow up with [facility] On 09/06/2013. (Please go to [facility]'s walk in clinic on Wednesday, September 06, 2013 or any weekday between 8AM-3PM for medication managment and counseling.)    Contact information:   [address], [place]   [address]  [phone]      Follow-up recommendations:    Comments:   Activities: Resume activity as tolerated. Diet: Heart healthy low sodium diet Tests: Follow up testing will be determined by your out patient provider. Total Discharge Time:  Greater than 30 minutes.  Signed: Marlane Hatcher. Mashburn RPAC 10:11 PM 09/17/2013   Patient was seen for psychiatric evaluation, suicide risk assessment and case discussed with physician  extender and formulated treatment plan and also developed disposition plan after discussed with the treatment team. Reviewed the information documented and agree with the treatment plan.  Fitz Matsuo,JANARDHAHA R. 09/18/2013 2:31 PM

## 2013-09-05 NOTE — Consult Note (Signed)
Note reviewed and agreed with

## 2013-09-05 NOTE — Progress Notes (Signed)
[facility] Adult Case Management Discharge Plan :  Will you be returning to the same living situation after discharge: Yes,  Patient is returning to her home. At discharge, do you have transportation home?:Yes,  Patient to arrange transportation. Do you have the ability to pay for your medications:Yes,  Patient is able to obtain medications.  Release of information consent forms completed and in the chart;  Patient's signature needed at discharge.  Patient to Follow up at: Follow-up Information   Follow up with [facility] On 09/06/2013. (Please go to [facility]'s walk in clinic on Wednesday, September 06, 2013 or any weekday between 8AM-3PM for medication managment and counseling.)    Contact information:   [address] [place], [place]   [address]  [phone]      Patient denies SI/HI:  Patient no longer endorsing SI/HI or other thoughts of self harm.  Safety Planning and Suicide Prevention discussed: .Reviewed with all patients during discharge planning group   Rie Mcneil, Eulas Post 09/05/2013, 10:59 AM

## 2013-09-05 NOTE — Progress Notes (Signed)
Discharge Note:  Patient discharged with bus ticket, plans to go to friend's home.  Denied SI and HI.  Denied A/V hallucinations.  Denied pain.  Patient received all her belongings, clothing, toiletries, miscellaneous items, prescriptions, medications.  Suicide prevention information given and discussed with patient who stated she understood and had no questions.   Patient continued to thank [facility] staff for assistance during [facility] stay.  Special thanks to [place] for her helpful attitude.  Patient has been cooperative and pleasant.

## 2013-09-05 NOTE — Progress Notes (Signed)
The focus of this group is to educate the patient on the purpose and policies of crisis stabilization and provide a format to answer questions about their admission.  The group details unit policies and expectations of patients while admitted.  Patient attended 0900 nurse education orientation group this morning.  Patient actively participated, appropriate affect, alert, appropriate insight and engagement.  Today patient will work on three discharge goals.

## 2013-09-05 NOTE — [facility] Suicide Risk Assessment (Signed)
[place] INPATIENT:  Family/Significant Other Suicide Prevention Education  Suicide Prevention Education:  Education Completed; [place], Friend, [phone];  has been identified by the patient as the family member/significant other with whom the patient will be residing, and identified as the person(s) who will aid the patient in the event of a mental health crisis (suicidal ideations/suicide attempt).  With written consent from the patient, the family member/significant other has been provided the following suicide prevention education, prior to the and/or following the discharge of the patient.  The suicide prevention education provided includes the following:  Suicide risk factors  Suicide prevention and interventions  National Suicide Hotline telephone number  [facility] assessment telephone number  [facility] Emergency Assistance [place] and/or Residential Mobile Crisis Unit telephone number  Request made of family/significant other to:  Remove weapons (e.g., guns, rifles, knives), all items previously/currently identified as safety concern.  Friend advised patient does not have access to guns.  Remove drugs/medications (over-the-counter, prescriptions, illicit drugs), all items previously/currently identified as a safety concern.  The family member/significant other verbalizes understanding of the suicide prevention education information provided.  The family member/significant other agrees to remove the items of safety concern listed above.  Concha Pyo 09/05/2013, 9:47 AM

## 2013-09-05 NOTE — Progress Notes (Signed)
D:  Patient's self inventory sheet, patient sleeps well, good appetite, normal energy level, good attention span.  Rated depression and hopeless 1.  Denied anxiety.  Has experienced chilling in past 24 hours.  Denied SI.  Has experienced pain in past 24 hours. Pain goal 5, worst pain 5.  After discharge, plans to take medication and get plenty of rest and take good care of herself.  Does have discharge plans.  No problems taking meds after discharge. A:  Medications administered per MD orders.  Emotional support and encouragement given patient. R:  Denied SI and HI.  Denied A/V hallucinations.  Denied pain.  Will continue to monitor patient for safety with 15 minute checks.  Safety maintained. Patient stated her medications are working.  Legs are feeling better, continues to apply medication and cover areas on upper bilateral legs.  Medication and covering applied to left ear.  Patient feels she is ready for discharge.

## 2013-09-08 NOTE — Progress Notes (Signed)
Patient Discharge Instructions:  After Visit Summary (AVS):   Faxed to:  09/08/13 Psychiatric Admission Assessment Note:   Faxed to:  09/08/13 Suicide Risk Assessment - Discharge Assessment:   Faxed to:  09/08/13 Faxed/Sent to the Next Level Care provider:  09/08/13 Faxed to [facility] [place], 09/08/2013, 3:23 PM

## 2013-09-24 ENCOUNTER — Emergency Department (HOSPITAL_COMMUNITY): Payer: Medicaid Other

## 2013-09-24 ENCOUNTER — Inpatient Hospital Stay (HOSPITAL_COMMUNITY)
Admission: EM | Admit: 2013-09-24 | Discharge: 2013-10-02 | DRG: 463 | Disposition: A | Discharge status: Home Health | Payer: Medicaid Other | Attending: Internal Medicine | Admitting: Internal Medicine

## 2013-09-24 ENCOUNTER — Encounter (HOSPITAL_COMMUNITY): Payer: Self-pay | Admitting: Emergency Medicine

## 2013-09-24 DIAGNOSIS — X58XXXA Exposure to other specified factors, initial encounter: Secondary | ICD-10-CM

## 2013-09-24 DIAGNOSIS — I10 Essential (primary) hypertension: Secondary | ICD-10-CM | POA: Diagnosis present

## 2013-09-24 DIAGNOSIS — F329 Major depressive disorder, single episode, unspecified: Secondary | ICD-10-CM | POA: Diagnosis present

## 2013-09-24 DIAGNOSIS — I776 Arteritis, unspecified: Principal | ICD-10-CM

## 2013-09-24 DIAGNOSIS — L97909 Non-pressure chronic ulcer of unspecified part of unspecified lower leg with unspecified severity: Secondary | ICD-10-CM | POA: Diagnosis present

## 2013-09-24 DIAGNOSIS — F17200 Nicotine dependence, unspecified, uncomplicated: Secondary | ICD-10-CM | POA: Diagnosis present

## 2013-09-24 DIAGNOSIS — Z681 Body mass index (BMI) 19 or less, adult: Secondary | ICD-10-CM

## 2013-09-24 DIAGNOSIS — F1210 Cannabis abuse, uncomplicated: Secondary | ICD-10-CM | POA: Diagnosis present

## 2013-09-24 DIAGNOSIS — F1420 Cocaine dependence, uncomplicated: Secondary | ICD-10-CM | POA: Diagnosis present

## 2013-09-24 DIAGNOSIS — M129 Arthropathy, unspecified: Secondary | ICD-10-CM | POA: Diagnosis present

## 2013-09-24 DIAGNOSIS — E43 Unspecified severe protein-calorie malnutrition: Secondary | ICD-10-CM | POA: Diagnosis present

## 2013-09-24 DIAGNOSIS — D649 Anemia, unspecified: Secondary | ICD-10-CM | POA: Diagnosis present

## 2013-09-24 DIAGNOSIS — Z5900 Homelessness unspecified: Secondary | ICD-10-CM

## 2013-09-24 DIAGNOSIS — M1990 Unspecified osteoarthritis, unspecified site: Secondary | ICD-10-CM | POA: Diagnosis present

## 2013-09-24 DIAGNOSIS — R45851 Suicidal ideations: Secondary | ICD-10-CM

## 2013-09-24 DIAGNOSIS — M069 Rheumatoid arthritis, unspecified: Secondary | ICD-10-CM | POA: Diagnosis present

## 2013-09-24 DIAGNOSIS — Z720 Tobacco use: Secondary | ICD-10-CM

## 2013-09-24 DIAGNOSIS — R64 Cachexia: Secondary | ICD-10-CM | POA: Diagnosis present

## 2013-09-24 DIAGNOSIS — S79919A Unspecified injury of unspecified hip, initial encounter: Secondary | ICD-10-CM

## 2013-09-24 DIAGNOSIS — L97109 Non-pressure chronic ulcer of unspecified thigh with unspecified severity: Secondary | ICD-10-CM | POA: Diagnosis present

## 2013-09-24 DIAGNOSIS — D72819 Decreased white blood cell count, unspecified: Secondary | ICD-10-CM

## 2013-09-24 DIAGNOSIS — F1910 Other psychoactive substance abuse, uncomplicated: Secondary | ICD-10-CM

## 2013-09-24 LAB — COMPREHENSIVE METABOLIC PANEL
ALT: 12 U/L (ref 0–35)
AST: 25 U/L (ref 0–37)
Albumin: 3.7 g/dL (ref 3.5–5.2)
Alkaline Phosphatase: 85 U/L (ref 39–117)
CO2: 25 mEq/L (ref 19–32)
Calcium: 9.4 mg/dL (ref 8.4–10.5)
GFR calc Af Amer: >90 mL/min (ref >90)
GFR calc non Af Amer: >90 mL/min (ref >90)
Potassium: 4 mEq/L (ref 3.5–5.1)
Sodium: 138 mEq/L (ref 135–145)
Total Bilirubin: 0.6 mg/dL (ref 0.3–1.2)
Total Protein: 7.6 g/dL (ref 6.0–8.3)

## 2013-09-24 LAB — CBC WITH DIFFERENTIAL/PLATELET
Basophils Absolute: 0 10*3/uL (ref 0.0–0.1)
Basophils Relative: 0 % (ref 0–1)
Eosinophils Absolute: 0 10*3/uL (ref 0.0–0.7)
Eosinophils Relative: 1 % (ref 0–5)
Lymphs Abs: 1.3 10*3/uL (ref 0.7–4.0)
MCH: 29.8 pg (ref 26.0–34.0)
MCHC: 34.9 g/dL (ref 30.0–36.0)
MCV: 85.5 fL (ref 78.0–100.0)
Monocytes Absolute: 0 10*3/uL — ABNORMAL LOW (ref 0.1–1.0)
Neutrophils Relative %: 62 % (ref 43–77)
Platelets: 259 10*3/uL (ref 150–400)
RBC: 4.56 MIL/uL (ref 3.87–5.11)
RDW: 13.9 % (ref 11.5–15.5)
WBC: 3.5 10*3/uL — ABNORMAL LOW (ref 4.0–10.5)

## 2013-09-24 LAB — URINALYSIS, ROUTINE W REFLEX MICROSCOPIC
Glucose, UA: NEGATIVE mg/dL
Hgb urine dipstick: NEGATIVE
Nitrite: NEGATIVE
Specific Gravity, Urine: 1.03 (ref 1.005–1.030)
pH: 6 (ref 5.0–8.0)

## 2013-09-24 LAB — MRSA PCR SCREENING: MRSA by PCR: NEGATIVE

## 2013-09-24 LAB — URINE MICROSCOPIC-ADD ON

## 2013-09-24 LAB — RAPID URINE DRUG SCREEN, HOSP PERFORMED
Amphetamines: NOT DETECTED
Barbiturates: NOT DETECTED
Benzodiazepines: NOT DETECTED
Tetrahydrocannabinol: POSITIVE — AB

## 2013-09-24 LAB — LACTIC ACID, PLASMA: Lactic Acid, Venous: 1.1 mmol/L (ref 0.5–2.2)

## 2013-09-24 MED ORDER — SODIUM CHLORIDE 0.9 % IV SOLN: 250.0000 mL | INTRAVENOUS | Status: DC | PRN

## 2013-09-24 MED ORDER — ONDANSETRON HCL 4 MG PO TABS: 4.0000 mg | ORAL_TABLET | Freq: Four times a day (QID) | ORAL | Status: DC | PRN

## 2013-09-24 MED ORDER — AMANTADINE HCL 100 MG PO CAPS
100.0000 mg | ORAL_CAPSULE | Freq: Two times a day (BID) | ORAL | Status: DC
  Administered 2013-09-24 – 2013-10-02 (×15): 100 mg via ORAL
  Filled 2013-09-24 (×17): qty 1

## 2013-09-24 MED ORDER — SODIUM CHLORIDE 0.9 % IJ SOLN: 3.0000 mL | INTRAMUSCULAR | Status: DC | PRN

## 2013-09-24 MED ORDER — ONDANSETRON HCL 4 MG/2ML IJ SOLN
4.0000 mg | Freq: Four times a day (QID) | INTRAMUSCULAR | Status: DC | PRN
  Administered 2013-09-25: 4 mg via INTRAVENOUS
  Filled 2013-09-24: qty 2

## 2013-09-24 MED ORDER — SODIUM CHLORIDE 0.9 % IJ SOLN
3.0000 mL | Freq: Two times a day (BID) | INTRAMUSCULAR | Status: DC
  Administered 2013-09-24 – 2013-10-02 (×10): 3 mL via INTRAVENOUS

## 2013-09-24 MED ORDER — SODIUM CHLORIDE 0.9 % IJ SOLN
3.0000 mL | Freq: Two times a day (BID) | INTRAMUSCULAR | Status: DC
  Administered 2013-09-25 – 2013-10-02 (×8): 3 mL via INTRAVENOUS

## 2013-09-24 MED ORDER — HYDROMORPHONE HCL PF 1 MG/ML IJ SOLN
1.0000 mg | INTRAMUSCULAR | Status: DC | PRN
  Administered 2013-09-24 – 2013-09-27 (×13): 1 mg via INTRAVENOUS
  Filled 2013-09-24 (×13): qty 1

## 2013-09-24 MED ORDER — MORPHINE SULFATE 2 MG/ML IJ SOLN
2.0000 mg | Freq: Once | INTRAMUSCULAR | Status: AC
  Administered 2013-09-24: 2 mg via INTRAVENOUS
  Filled 2013-09-24: qty 1

## 2013-09-24 MED ORDER — MORPHINE SULFATE 4 MG/ML IJ SOLN
4.0000 mg | Freq: Once | INTRAMUSCULAR | Status: AC
  Administered 2013-09-24: 4 mg via INTRAVENOUS
  Filled 2013-09-24: qty 1

## 2013-09-24 MED ORDER — HEPARIN SODIUM (PORCINE) 5000 UNIT/ML IJ SOLN
5000.0000 [IU] | Freq: Three times a day (TID) | INTRAMUSCULAR | Status: DC
  Administered 2013-09-24 – 2013-09-25 (×3): 5000 [IU] via SUBCUTANEOUS
  Filled 2013-09-24 (×8): qty 1

## 2013-09-24 MED ORDER — BUPROPION HCL 100 MG PO TABS
100.0000 mg | ORAL_TABLET | Freq: Two times a day (BID) | ORAL | Status: DC
  Administered 2013-09-24 – 2013-10-02 (×15): 100 mg via ORAL
  Filled 2013-09-24 (×17): qty 1

## 2013-09-24 MED ORDER — DOCUSATE SODIUM 100 MG PO CAPS
100.0000 mg | ORAL_CAPSULE | Freq: Two times a day (BID) | ORAL | Status: DC
  Administered 2013-09-24 – 2013-10-02 (×15): 100 mg via ORAL
  Filled 2013-09-24 (×17): qty 1

## 2013-09-24 MED ORDER — TRAZODONE HCL 50 MG PO TABS
50.0000 mg | ORAL_TABLET | Freq: Every evening | ORAL | Status: DC | PRN
  Administered 2013-09-24 – 2013-10-01 (×4): 50 mg via ORAL
  Filled 2013-09-24 (×5): qty 1

## 2013-09-24 NOTE — ED Provider Notes (Signed)
I saw and evaluated the patient, reviewed the resident's note and I agree with the findings and plan.   .Face to face Exam:  General:  Awake HEENT:  Atraumatic Resp:  Normal effort Abd:  Nondistended Neuro:No focal weakness   Dot Lanes, MD 09/24/13 [phone]

## 2013-09-24 NOTE — ED Notes (Signed)
Patient transported to X-ray 

## 2013-09-24 NOTE — ED Provider Notes (Signed)
CSN: [id number]     Arrival date & time 09/24/13  1214 History   First MD Initiated Contact with Patient 09/24/13 1309     Chief Complaint  Patient presents with  . Sore   (Consider location/radiation/quality/duration/timing/severity/associated sxs/prior Treatment) HPI [name] is a 52 y.o. 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 The patient claims they have been there for some time and she had previously covered them up with a clean "non-stick" pad but recently ran out and began using paper towels to cover them up. She claims they became much larger over the past 2-3 days. She claims to have a subjective fever, chills, and nausea.  Patient was recently discharged from Behavioral health on 09/05/13 after after having suicidal ideations.   Past Medical History  Diagnosis Date  . 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 Followed by Dr. Louanne Skye  . Hypertension   . Cocaine abuse     ongoing with resultant vaculitis.  . Tobacco abuse   . Depression   . Normocytic anemia     BL Hgb 9.8-12. Last anemia panel 04/2010 - showing Fe 19, ferritin 101.  Pt on monthly B12 injections  . Rheumatoid arthritis(714.0)     patient reported  . Inflammatory arthritis    Past Surgical History  Procedure Laterality Date  . Skin biopsy      bilateral shin nodules on 04/2010  . Hernia repair     Family History  Problem Relation Age of Onset  . Breast cancer Mother     Breast cancer  . Alcohol abuse Mother   . Colon cancer Maternal Aunt 17  . Alcohol abuse Father    History  Substance Use Topics  . Smoking status: Current Every Day Smoker -- 0.50 packs/day for 39 years    Types: Cigarettes  . Smokeless tobacco: Never Used     Comment: trying  . Alcohol Use: No   OB History   Grav Para  Term Preterm Abortions TAB SAB Ect Mult Living                 Review of Systems General: Positive for subjective fever, chills. Denies diaphoresis, appetite change and fatigue.  Respiratory: Denies SOB, DOE, cough, chest tightness, and wheezing.   Cardiovascular: Denies chest pain, palpitations and leg swelling.  Gastrointestinal: Positive for nausea. Denies vomiting, abdominal pain, diarrhea, constipation, blood in stool and abdominal distention.  Genitourinary: Denies dysuria, urgency, frequency, hematuria, flank pain and difficulty urinating.  Endocrine: Denies hot or cold intolerance, sweats, polyuria, polydipsia. Musculoskeletal: Positive for left leg pain/wounds. Denies myalgias, back pain, joint swelling, arthralgias and gait problem.  Skin: Denies pallor, rash and wounds.  Neurological: Denies dizziness, seizures, syncope, weakness, lightheadedness, numbness and headaches.  Psychiatric/Behavioral: Denies mood changes, confusion, nervousness, sleep disturbance and agitation.  Allergies  Acetaminophen  Home Medications   Current Outpatient Rx  Name  Route  Sig  Dispense  Refill  . amantadine (SYMMETREL) 100 MG capsule   Oral   Take 1 capsule (100 mg total) by mouth 2 (two) times daily. For cravings of cocaine.   60 capsule   0   . buPROPion (WELLBUTRIN) 100 MG tablet   Oral   Take 1 tablet (100 mg total) by mouth 2 (two) times daily. For  depression related to cocaine use.   60 tablet   0   . traZODone (DESYREL) 50 MG tablet      Take one tablet at bedtime for insomnia.   15 tablet   0    Physical Exam   Filed Vitals:   09/24/13 1203 09/24/13 1206  BP: 165/115 165/115  Pulse: 109 91  Temp: 98.5 F (36.9 C) 98.9 F (37.2 C)  TempSrc: Oral Oral  Resp: 24 20  SpO2: 100% 99%  General: Vital signs reviewed. Patient is a frail, cachectic appearing female, in moderate distress, tearful on exam, and cooperative. Alert and oriented x3.  Head: Normocephalic. Healing  skin lesions on the face, nectrotic/fibrotic appearing helices of the ears bilaterally.  Eyes: PERRL, EOMI, conjunctivae normal, No scleral icterus.  Neck: Supple, trachea midline, normal ROM, No JVD, masses, thyromegaly, or carotid bruit present.  Cardiovascular: RRR, S1 normal, S2 normal, no murmurs, gallops, or rubs. Pulmonary/Chest: Normal respiratory effort, CTAB, no wheezes, rales, or rhonchi. Abdominal: Soft, non-tender, non-distended, bowel sounds are normal, no masses, organomegaly, or guarding present.  Musculoskeletal: No joint deformities, erythema, or stiffness, ROM full and no nontender. Extremities: 2 large (3-4 cm) bloody, necrotic-appearing, foul smelling lesions on the left thigh, w/ surrounding erythema. Very tender to the touch. Otherwise, no swelling or edema,  pulses symmetric and intact bilaterally. No cyanosis or clubbing. Neurological: A&O x3, Strength is normal and symmetric bilaterally, cranial nerve II-XII are grossly intact, no focal motor deficit, sensory intact to light touch bilaterally.  Skin: Warm, dry and intact. No rashes or erythema. Psychiatric: Extremely tearful, speech normal. Cognition and memory are normal.   ED Course  Procedures (including critical care time) Labs Review Labs Reviewed  WOUND CULTURE  CULTURE, BLOOD (ROUTINE X 2)  CULTURE, BLOOD (ROUTINE X 2)  CBC WITH DIFFERENTIAL  COMPREHENSIVE METABOLIC PANEL  URINE RAPID DRUG SCREEN (HOSP PERFORMED)  LACTIC ACID, PLASMA  URINALYSIS, ROUTINE W REFLEX MICROSCOPIC   Imaging Review No results found.  EKG Interpretation   None       MDM   [name] is a 52 y.o. 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 Most likely related to Levamasole-induced vasculitis and cutaneous  necrosis. -CBC shows WBC's of 3.5, otherwise wnl. Hb 13.6.  -CMET wnl -Lactic acid 1.1 -Left femur XR w/out bony abnormality, subcutaneous gas, or foreign body. -Blood/wound cultures pending -Inserted Foley catheter, UA shows small bili, small leukocytes, many squames, few bacteria. UDS +ve for opiates, cocaine, and THC. -Morphine 4 mg IV given   Patient will need to be admitted for significant cutaneous necrosis of the right thigh. Discussed with IMTS, will admit.   Corky Sox, MD 09/24/13 [phone]

## 2013-09-24 NOTE — ED Notes (Signed)
Pt presents with significantly sized sores on both thighs and ears. On the left thigh, sores are covered with papers, no active bleeding assessed. Sores noted on the ear lobes too. Pt denies knowledge of they came about.

## 2013-09-24 NOTE — H&P (Signed)
Date: 09/24/2013               Patient Name:  [name] MRN: [id number]  DOB: 04/30/1961 Age / Sex: 52 y.o., female   PCP: Charlann Lange, MD         Medical Service: Internal Medicine Teaching Service         Attending Physician: Dr. Sid Falcon, MD    First Contact: Dr. Lum Babe, MD Pager: [phone]  Second Contact: Dr. Randell Loop, MD Pager: [phone]       After Hours (After 5p/  First Contact Pager: [phone]  weekends / holidays): Second Contact Pager: [phone]   Chief Complaint: Wounds  History of Present Illness: [name] is a 52 y.o. woman with PMHx of cocaine abuse c/b vasculitis 2/2 Levimasole, Depression, anemia, and RA, presents to the ED w/ complaints of 2 new left thigh lesions. The wounds began two weeks ago a few days after suing cocaine. The wounds have recently increased in size, developed a foul odor, and are extremely painful. The patient claims they have been there for some time and she had previously covered them up with a clean "non-stick" pad but recently ran out and began using paper towels to cover them up. She claims they became much larger over the past 2-3 days. She claims to have chills and nausea. Denies purulent drainage from wounds. Denies chest pain SOB.   Of note, the patient was recently discharged from Behavioral health on 09/05/13 after after having suicidal ideations.    Meds: No current facility-administered medications for this encounter.    Allergies: Allergies as of 09/24/2013 - Review Complete 09/24/2013  Allergen Reaction Noted  . Acetaminophen Swelling    Past Medical History  Diagnosis Date  . 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 Followed by Dr. Louanne Skye  . Hypertension   . Cocaine abuse     ongoing with resultant vaculitis.  . Tobacco abuse   . Depression   . Normocytic anemia     BL Hgb 9.8-12. Last anemia panel 04/2010 - showing Fe 19, ferritin 101.  Pt on monthly B12 injections  . Rheumatoid arthritis(714.0)    patient reported  . Inflammatory arthritis    Past Surgical History  Procedure Laterality Date  . Skin biopsy      bilateral shin nodules on 04/2010  . Hernia repair     Family History  Problem Relation Age of Onset  . Breast cancer Mother     Breast cancer  . Alcohol abuse Mother   . Colon cancer Maternal Aunt 84  . Alcohol abuse Father    History   Social History  . Marital Status: Single    Spouse Name: N/A    Number of Children: 0  . Years of Education: 11th grade   Occupational History  . Disability     since 2011, due to her rheumatoid arthritis   Social History Main Topics  . Smoking status: Current Every Day Smoker -- 0.50 packs/day for 39 years    Types: Cigarettes  . Smokeless tobacco: Never Used     Comment: trying  . Alcohol Use: No  . Drug Use: Yes    Special: "Crack" cocaine, Cocaine, Marijuana     Comment: smokes cocaine  . Sexual Activity: No   Other Topics Concern  . Not on file   Social History Narrative   Unemployed:  cleaning in past   Living at [place]; has Medicaid.  crack/cocaine use; pt denies IVDU   tobacco:  1/2 ppd, trying to quit   alcohol:  none        Review of Systems: Pertinent items are noted in HPI.  Physical Exam: Blood pressure 155/82, pulse 114, temperature 99.6 F (37.6 C), temperature source Oral, resp. rate 18, SpO2 99.00%. Physical Exam  Constitutional: She is oriented to person, place, and time.  Frail tearful middle aged woman that appears older than her stated age.   HENT:  Head: Normocephalic.  Mouth/Throat: Oropharynx is clear and moist. No oropharyngeal exudate.  Eyes: EOM are normal. Pupils are equal, round, and reactive to light.  Cardiovascular: Regular rhythm and normal heart sounds.  Exam reveals no gallop and no friction rub.   No murmur heard. Tachycardic, but very emotional  Pulmonary/Chest: Effort normal and breath sounds normal. No respiratory distress. She has no wheezes. She has no  rales.  Abdominal: Soft. Bowel sounds are normal. She exhibits no distension. There is no tenderness.  Neurological: She is alert and oriented to person, place, and time.  Skin:     Two necrotic wounds on the left anterior thigh. They are approximately 3 x 3 cm. There is no surrounding erythema or purulent drainage.   There are a number or purpuric rashes on the right anterior thigh with 1-2 mm surrounding erythema.  Psychiatric:  Tearful and anxious. In a lot of pain     Lab results: Basic Metabolic Panel:  Recent Labs  09/24/13 1352  NA 138  K 4.0  CL 101  CO2 25  GLUCOSE 109*  BUN 11  CREATININE 0.63  CALCIUM 9.4   Liver Function Tests:  Recent Labs  09/24/13 1352  AST 25  ALT 12  ALKPHOS 85  BILITOT 0.6  PROT 7.6  ALBUMIN 3.7   CBC:  Recent Labs  09/24/13 1352  WBC 3.5*  NEUTROABS 2.2  HGB 13.6  HCT 39.0  MCV 85.5  PLT 259   Cardiac Enzymes: No results found for this basename: CKTOTAL, CKMB, CKMBINDEX, TROPONINI,  in the last 72 hours BNP: No results found for this basename: PROBNP,  in the last 72 hours D-Dimer: No results found for this basename: DDIMER,  in the last 72 hours CBG: No results found for this basename: GLUCAP,  in the last 72 hours Hemoglobin A1C: No results found for this basename: HGBA1C,  in the last 72 hours Fasting Lipid Panel: No results found for this basename: CHOL, HDL, LDLCALC, TRIG, CHOLHDL, LDLDIRECT,  in the last 72 hours Thyroid Function Tests: No results found for this basename: TSH, T4TOTAL, FREET4, T3FREE, THYROIDAB,  in the last 72 hours Anemia Panel: No results found for this basename: VITAMINB12, FOLATE, FERRITIN, TIBC, IRON, RETICCTPCT,  in the last 72 hours Coagulation: No results found for this basename: LABPROT, INR,  in the last 72 hours Urine Drug Screen: Drugs of Abuse     Component Value Date/Time   LABOPIA POSITIVE* 09/24/2013 1358   LABOPIA PPS 08/31/2012 1045   COCAINSCRNUR POSITIVE*  09/24/2013 1358   COCAINSCRNUR NEG 08/31/2012 1045   LABBENZ NONE DETECTED 09/24/2013 1358   LABBENZ NEG 08/31/2012 1045   LABBENZ NEGATIVE 06/12/2011 0221   AMPHETMU NONE DETECTED 09/24/2013 1358   AMPHETMU NEGATIVE 06/12/2011 0221   THCU POSITIVE* 09/24/2013 1358   THCU 63.4* 05/20/2012 1405   LABBARB NONE DETECTED 09/24/2013 1358   LABBARB NEG 08/31/2012 1045    Alcohol Level: No results found for this basename: ETH,  in the  last 72 hours Urinalysis:  Recent Labs  09/24/13 [place] 1.030  PHURINE 6.0  GLUCOSEU NEGATIVE  HGBUR NEGATIVE  BILIRUBINUR SMALL*  KETONESUR NEGATIVE  PROTEINUR NEGATIVE  UROBILINOGEN 1.0  NITRITE NEGATIVE  LEUKOCYTESUR SMALL*    Imaging results:  Dg Femur Left  09/24/2013   CLINICAL DATA:  Two sores  EXAM: LEFT FEMUR - 2 VIEW  COMPARISON:  04/11/2010  FINDINGS: There is no evidence of fracture or other focal bone lesions. Soft tissue irregularity anterior to the distal femoral shaft. No radiodense foreign body or subcutaneous gas.  IMPRESSION: No bone abnormality, subcutaneous gas, or foreign body.   Electronically Signed   By: Arne Cleveland M.D.   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 The wound has scant serosanguinous discharge at this time. No purulence. No surrounding erythema indicative of infection at this time.  - IV dilaudid for pain control - Wound care consult - Plastic surgery consult in AM - Low threshold to start abx if patient appears septic (unstable vital signs) or spikes a fever  Polysubstance Drug Use SW consult for resources. Patient denies Use in  the last two weeks, but UDS positive for cocaine. - continue amantadine 100 mg BID for cocaine cravings - f/u EKG - F/U HIV  MDD c/b SI Patient denies SI at this time. Will reevaulaute prior to discharge. Continue home meds. (bupropion and trazodone).  DVT: sq heparin  Diet: Regular  Dispo: Disposition is deferred at this time, awaiting improvement of current medical problems. Anticipated discharge in approximately 2-3 day(s).   The patient does have a current PCP (Charlann Lange, MD) and does need an [facility] hospital follow-up appointment after discharge.  The patient does have transportation limitations that hinder transportation to clinic appointments.  Signed: Marrion Coy, MD 09/24/2013, 6:36 PM

## 2013-09-24 NOTE — ED Notes (Signed)
Patient stated that she placed paper towels on the wounds and now they are stuck.  Placed saline soaked gauze over wounds to help release paper towels.

## 2013-09-24 NOTE — ED Notes (Signed)
She c/o exquisitely painful erythematous sores above bilat. Knees which she states she has had before, and they occur spontaneously.  She is tearful and in no distress.

## 2013-09-24 NOTE — ED Notes (Signed)
Bed: WLPT2 Expected date:  Expected time:  Means of arrival:  Comments: ems

## 2013-09-25 ENCOUNTER — Encounter (HOSPITAL_COMMUNITY): Payer: Self-pay | Admitting: Emergency Medicine

## 2013-09-25 DIAGNOSIS — L97109 Non-pressure chronic ulcer of unspecified thigh with unspecified severity: Secondary | ICD-10-CM

## 2013-09-25 DIAGNOSIS — M300 Polyarteritis nodosa: Secondary | ICD-10-CM

## 2013-09-25 DIAGNOSIS — E43 Unspecified severe protein-calorie malnutrition: Secondary | ICD-10-CM | POA: Diagnosis present

## 2013-09-25 LAB — BASIC METABOLIC PANEL
CO2: 24 mEq/L (ref 19–32)
Calcium: 9.1 mg/dL (ref 8.4–10.5)
Creatinine, Ser: 0.65 mg/dL (ref 0.50–1.10)
GFR calc Af Amer: >90 mL/min (ref >90)
GFR calc non Af Amer: >90 mL/min (ref >90)
Sodium: 136 mEq/L (ref 135–145)

## 2013-09-25 LAB — CBC
Platelets: 218 10*3/uL (ref 150–400)
RBC: 3.88 MIL/uL (ref 3.87–5.11)
WBC: 3.5 10*3/uL — ABNORMAL LOW (ref 4.0–10.5)

## 2013-09-25 LAB — URINE CULTURE: Culture: NO GROWTH

## 2013-09-25 LAB — HIV ANTIBODY (ROUTINE TESTING W REFLEX): HIV: NONREACTIVE

## 2013-09-25 IMAGING — CT CT ANGIO CHEST
2 of 6 series · 19 of 46 positions shown · IV contrast (APPLIED)
Comparison: Chest radiograph 10/12/2011 and chest CT 06/11/2011

CLINICAL DATA: Elevated D-dimer, pleuritic chest pain, and
tachycardia.

CT ANGIOGRAPHY CHEST
TECHNIQUE: Multidetector CT imaging of the chest using the
standard protocol during bolus administration of intravenous
contrast. Multiplanar reconstructed images including MIPs were
obtained and reviewed to evaluate the vascular anatomy.

[Series 6: pulm embolism 1.0 b25f thin · axial · 0.65mm/px · z∈[-192,-5]mm · 16 of 205 slices shown]
[im 9/205  lung]
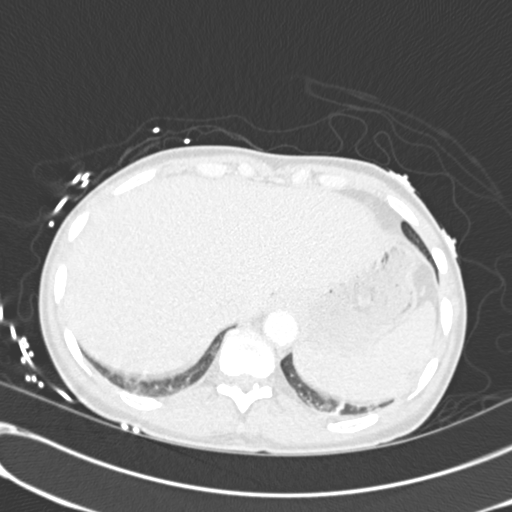
[im 27/205  soft-tissue]
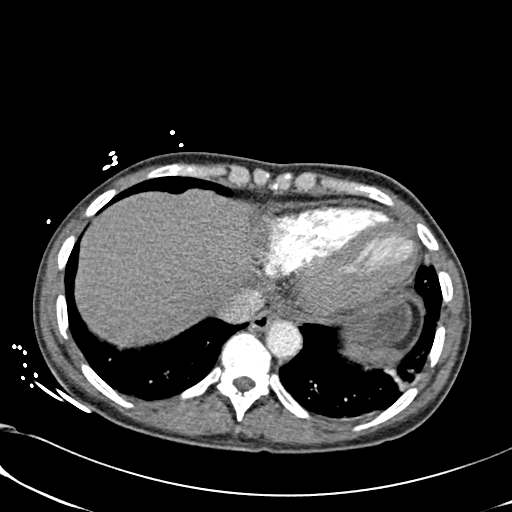
[im 36/205  lung]
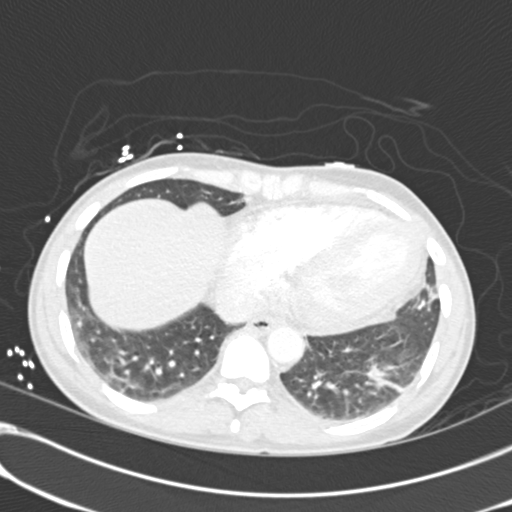
[im 45/205  soft-tissue]
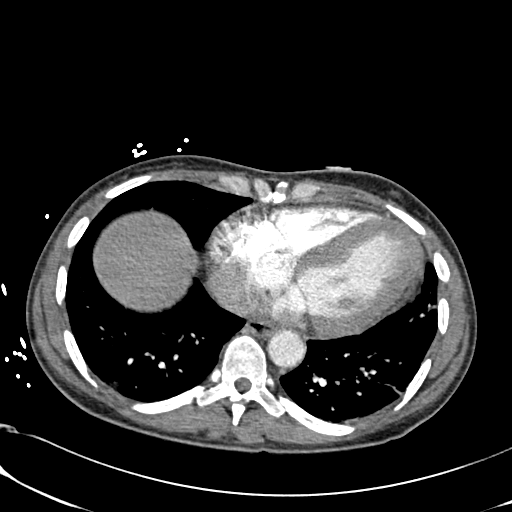
[im 63/205  lung]
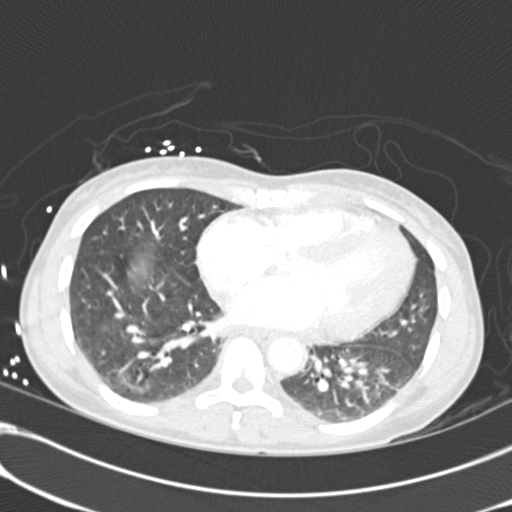
[im 71/205  soft-tissue]
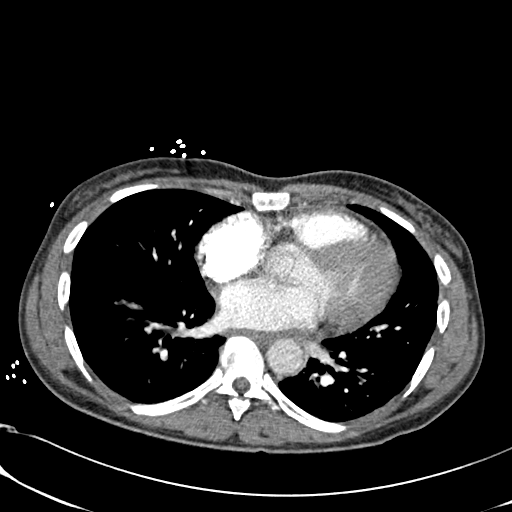
[im 80/205  lung]
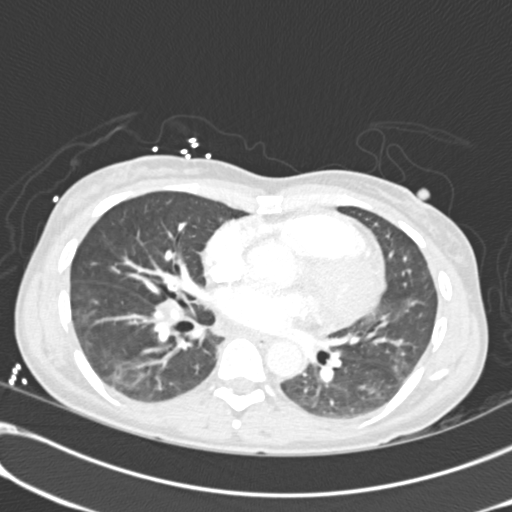
[im 98/205  soft-tissue]
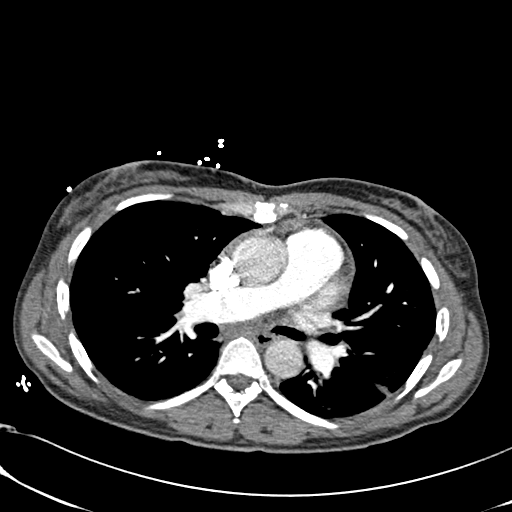
[im 107/205  lung]
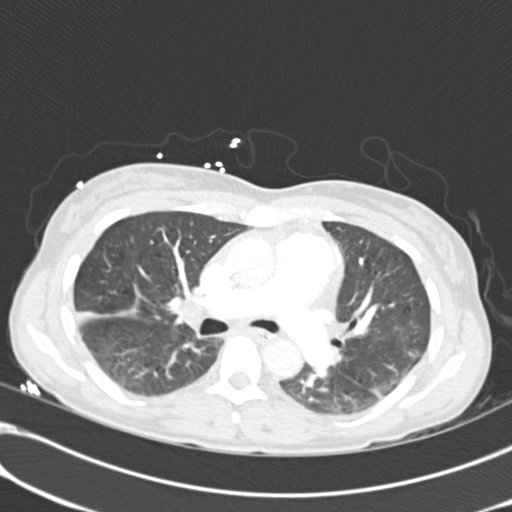
[im 125/205  soft-tissue]
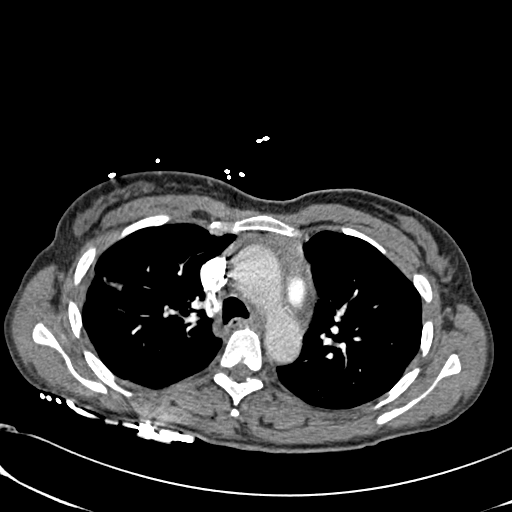
[im 134/205  lung]
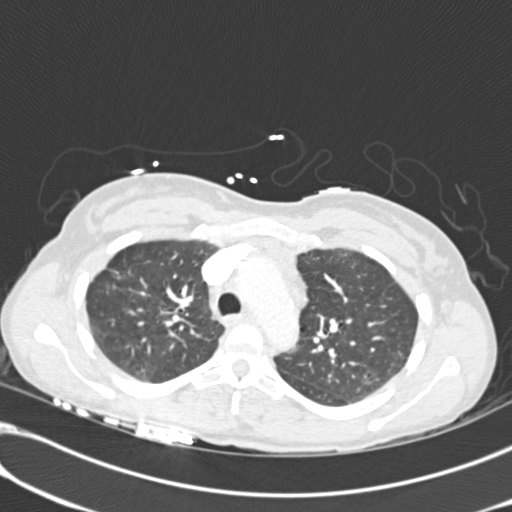
[im 142/205  soft-tissue]
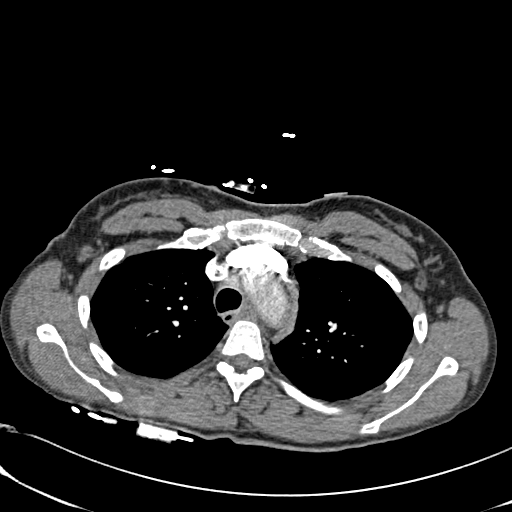
[im 160/205  lung]
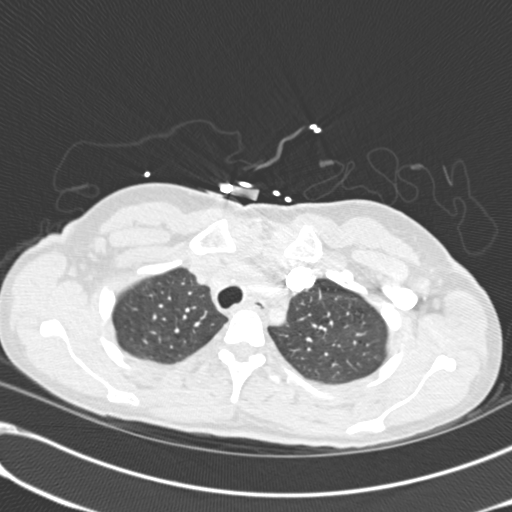
[im 169/205  soft-tissue]
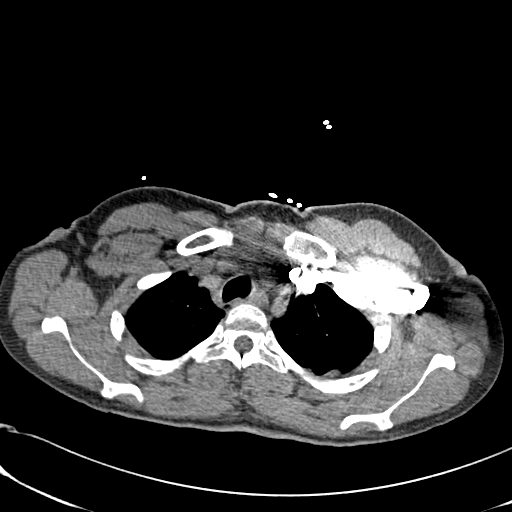
[im 178/205  lung]
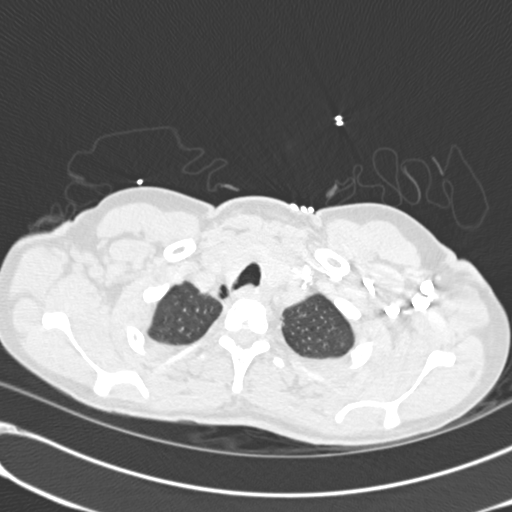
[im 196/205  soft-tissue]
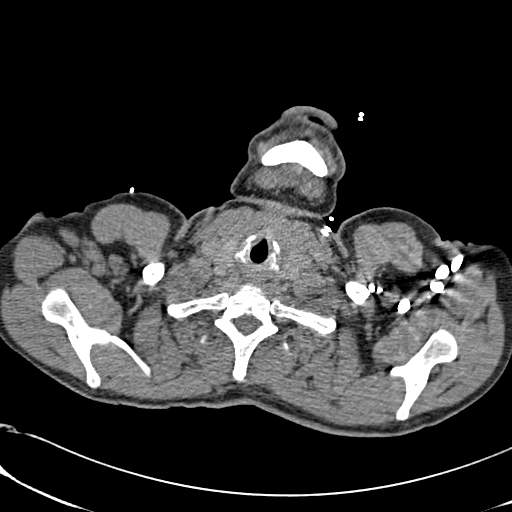

[Series 602: cor · coronal · 0.65mm/px · 3 of 78 slices shown]
[im 20/78  soft-tissue]
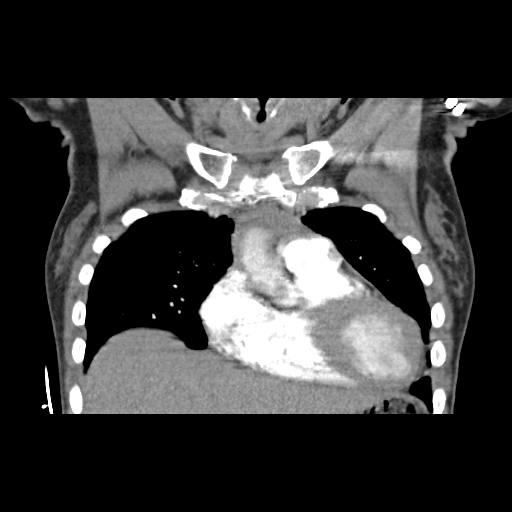
[im 39/78  soft-tissue]
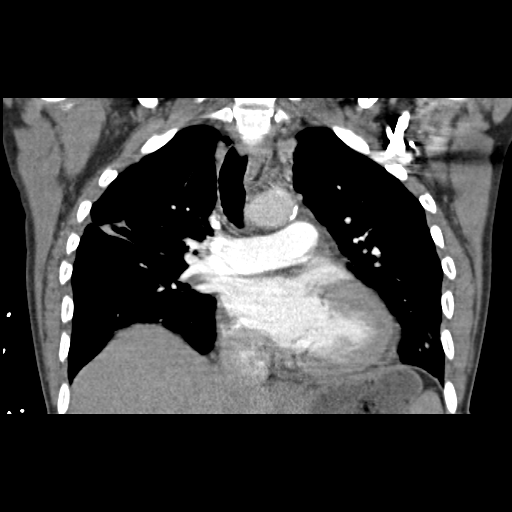
[im 58/78  soft-tissue]
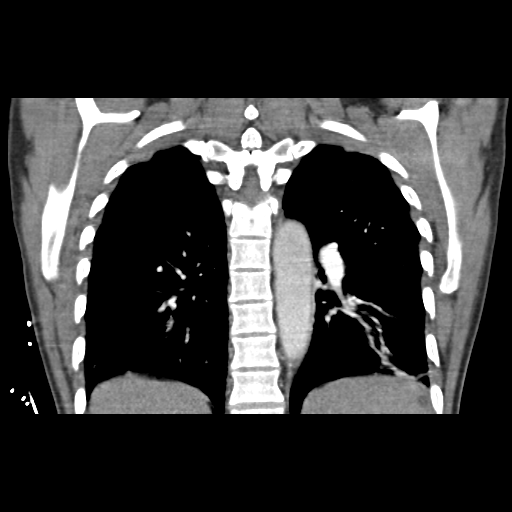

[19 of 46 positions shown; findings below may reference images not displayed]

FINDINGS: This is a satisfactory evaluation of the pulmonary
arterial tree.  No evidence of pulmonary arterial filling defect.
Thoracic aorta is normal in caliber and enhancement.  No evidence
of aortic dissection.  Stable cardiomegaly.

Negative for lymphadenopathy, pleural effusion, or pericardial
effusion.

Mild centrilobular emphysema is present.  There is band-like
scarring in the right upper [place], likely secondary to previous
infection in this region on chest CT June 2011.  Scattered
areas of dependent atelectasis.  No airspace consolidation or
pulmonary edema is identified.

Soft tissues of the breasts are unremarkable by chest CT.

No acute or suspicious bony abnormality.

No acute findings in the imaged portion of the upper abdomen.
IMPRESSION: 1.  Negative for pulmonary embolism.
2.  Stable cardiomegaly.
3.  Scarring in the right upper lobe and scattered areas of
atelectasis bilaterally.  No acute findings identified in the
chest.
4.  Mild emphysema.

## 2013-09-25 IMAGING — CR DG CHEST 2V
2 series · 2 of 2 positions shown · non-contrast
Comparison: 07/28/2011

CLINICAL DATA: Chest pain, hypertension

CHEST - 2 VIEW

[w chest pa]
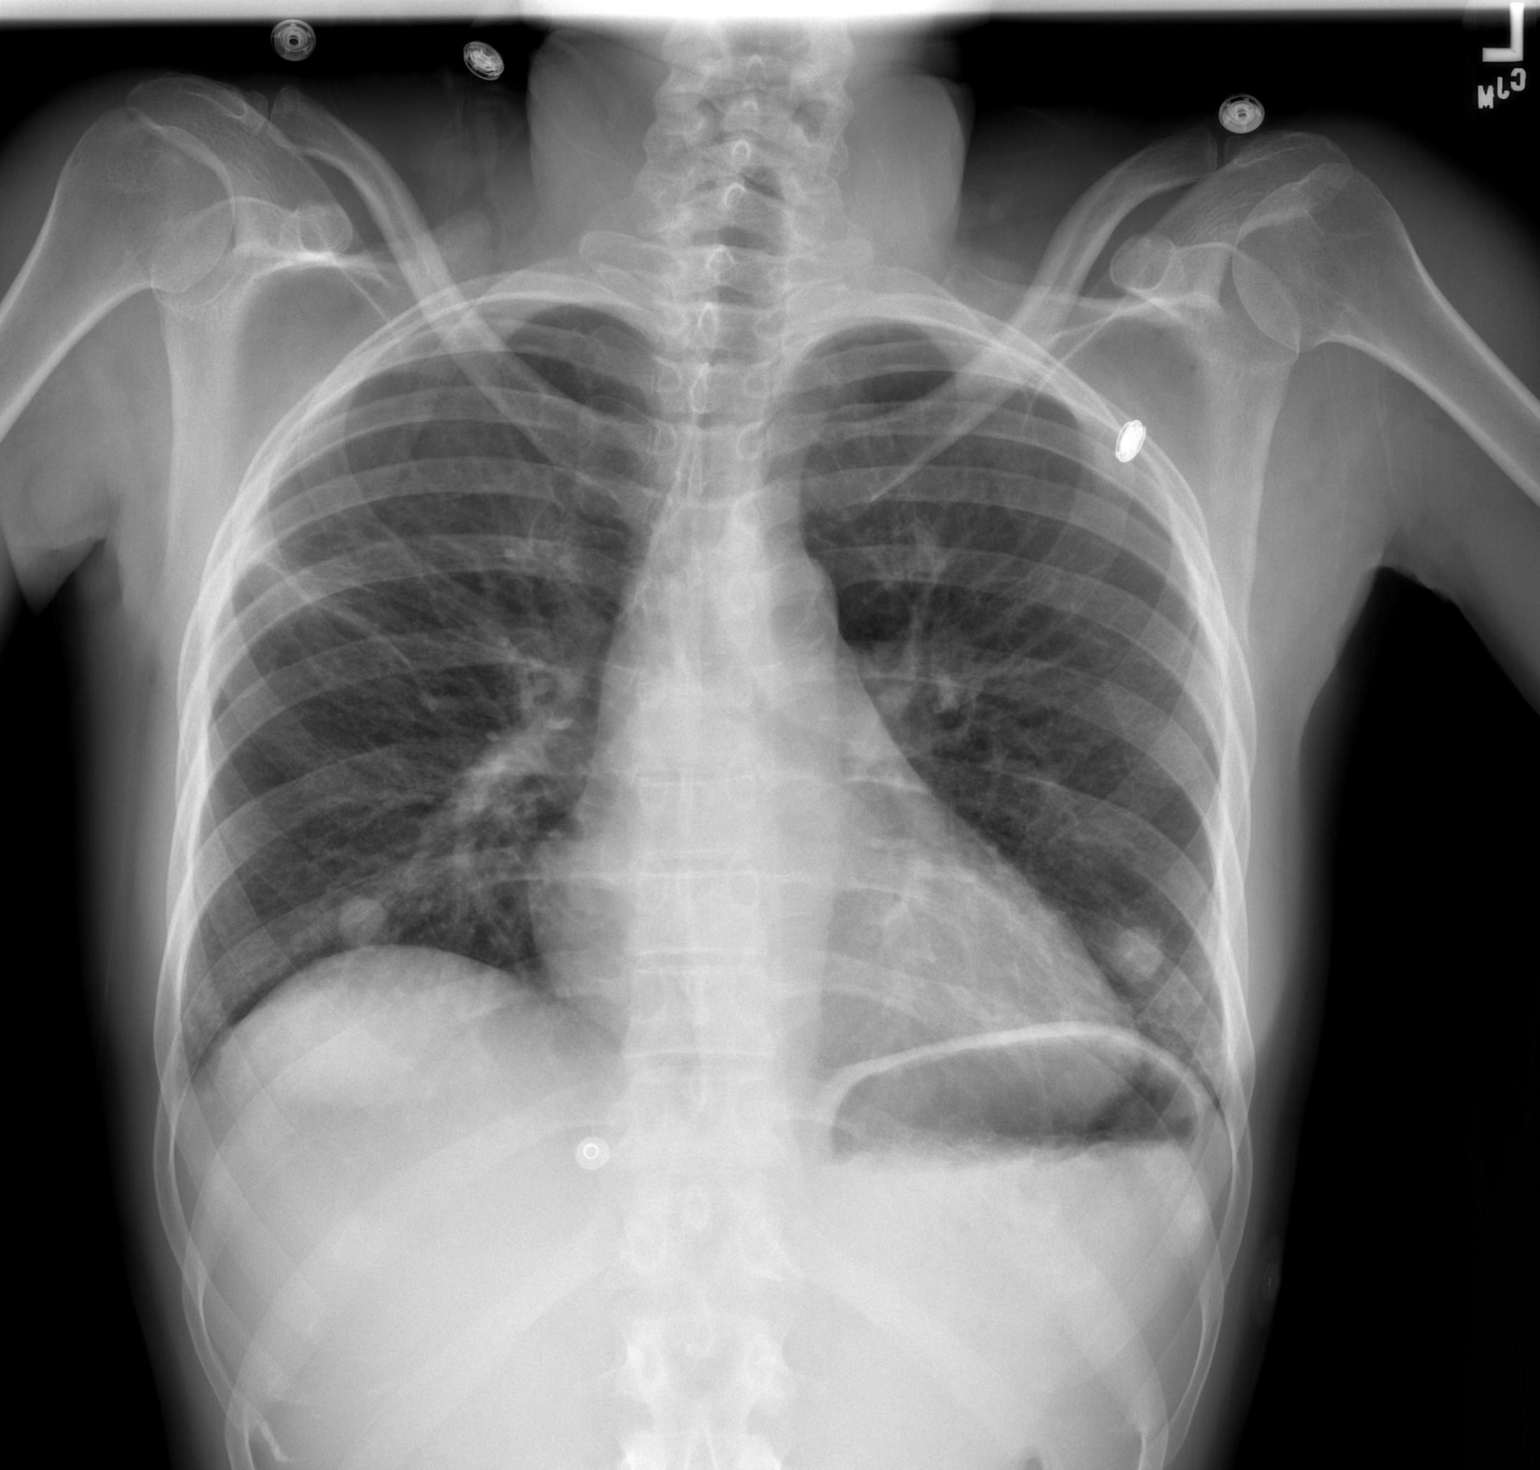

[w chest lat]
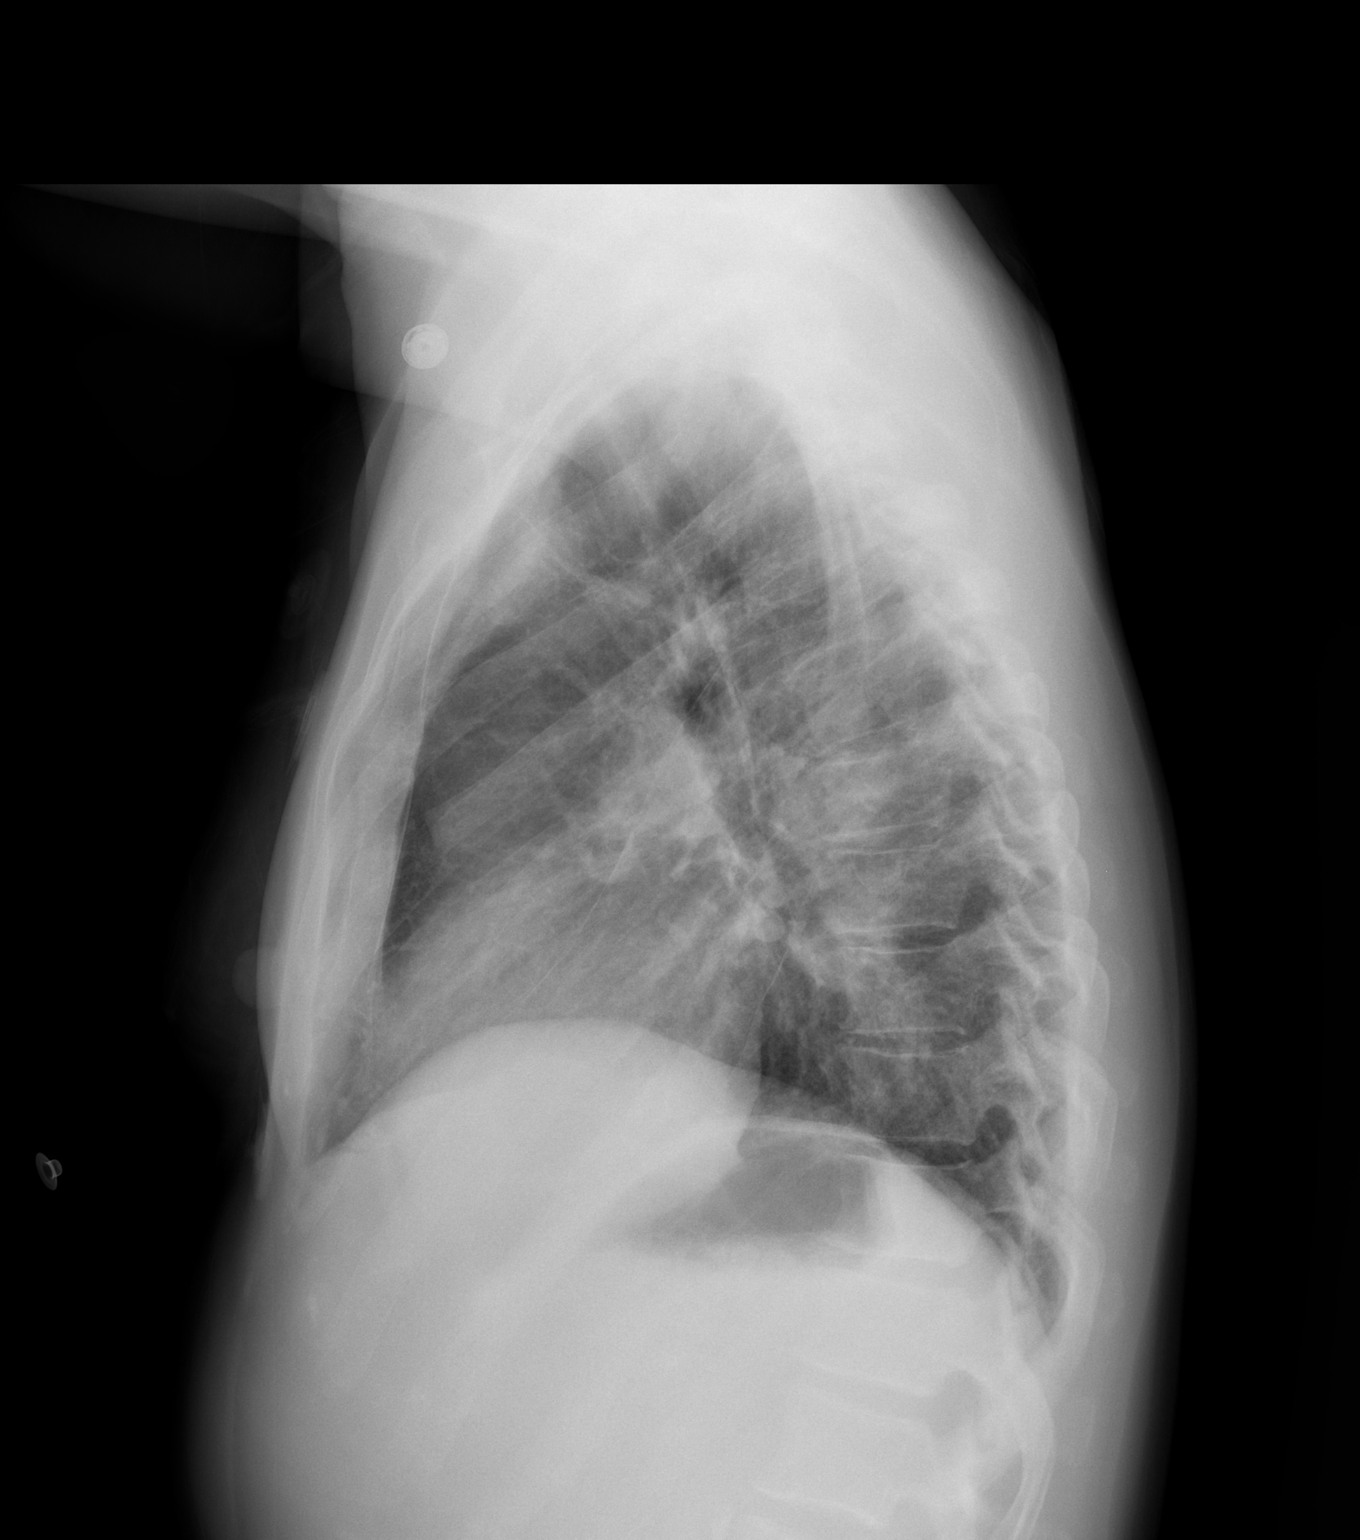

[2 of 2 positions shown; findings below may reference images not displayed]

FINDINGS: Enlargement of cardiac silhouette.
Mediastinal contours and pulmonary vascularity normal.
Decreased atelectasis versus infiltrate right upper lobe.
Remaining lungs clear.
No pleural effusion or pneumothorax.
Bilateral nipple shadows unchanged.
No acute osseous findings.
IMPRESSION: Bilateral nipple shadows.
Decreased right upper lobe atelectasis versus infiltrate since
previous exam.

## 2013-09-25 MED ORDER — ADULT MULTIVITAMIN W/MINERALS CH
1.0000 | ORAL_TABLET | Freq: Every day | ORAL | Status: DC
  Administered 2013-09-25 – 2013-10-02 (×7): 1 via ORAL
  Filled 2013-09-25 (×8): qty 1

## 2013-09-25 MED ORDER — OXYCODONE HCL 5 MG PO TABS
5.0000 mg | ORAL_TABLET | ORAL | Status: DC | PRN
  Administered 2013-09-25 – 2013-09-28 (×8): 5 mg via ORAL
  Filled 2013-09-25 (×8): qty 1

## 2013-09-25 MED ORDER — ENSURE COMPLETE PO LIQD
237.0000 mL | Freq: Three times a day (TID) | ORAL | Status: DC
  Administered 2013-09-25 – 2013-10-02 (×16): 237 mL via ORAL

## 2013-09-25 NOTE — Progress Notes (Signed)
INITIAL NUTRITION ASSESSMENT  DOCUMENTATION CODES Per approved criteria  -Severe malnutrition in the context of chronic illness -Underweight   INTERVENTION:  1. Ensure Complete po TID, each supplement provides 350 kcal and 13 grams of protein  2. MVI daily.  NUTRITION DIAGNOSIS: Malnutrition related to chronic illness as evidenced by severe fat and muscle wasting.   Goal: Pt to meet >/= 90% of their estimated nutrition needs   Monitor:  PO intake, weight trend, labs   Reason for Assessment: Pt identified as at nutrition risk on the Malnutrition Screen Tool  52 y.o. 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  Pt crying when I entered her room. MD had just left. Wounds on thigh open to air as WOC RN on her way to see pt. Pt reports decreased appetite. Difficult to complete interview as pt crying. WOC in to see pt.  Pt reports that her wounds had just recently appeared and increased in size quickly. They are very painful and have a foul odor. Pt with recent admissions to [facility] for suicidal ideation.  Pt endorses weight loss but cannot be specific about amount or time frame. Pt with hx of drug abuse and was positive for cocaine on admission.   Nutrition Focused Physical Exam:  Subcutaneous Fat:  Orbital Region: severe wasting Upper Arm Region: severe wasting Thoracic and Lumbar Region: severe wasting  Muscle:  Temple Region: severe wasting Clavicle Bone Region: severe wasting Clavicle and Acromion Bone Region: severe wasting Scapular Bone Region: severe wasting Dorsal Hand: severe wasting Patellar Region: severe wasting Anterior Thigh Region: severe wasting Posterior Calf Region: severe wasting  Edema: not present   Height: Ht Readings from Last 1 Encounters:  08/31/13 5' 2"  (1.575 m)    Weight: Wt Readings from Last 1 Encounters:  08/31/13 95  lb (43.092 kg)  No new weight  Ideal Body Weight: 50 kg   % Ideal Body Weight: 86%  Wt Readings from Last 10 Encounters:  08/31/13 95 lb (43.092 kg)  06/23/13 94 lb (42.638 kg)  06/19/13 92 lb 8 oz (41.958 kg)  06/17/13 102 lb (46.267 kg)  06/14/13 101 lb (45.813 kg)  05/14/13 94 lb (42.638 kg)  05/13/13 99 lb (44.906 kg)  04/18/13 100 lb (45.36 kg)  02/18/13 106 lb (48.081 kg)  12/28/12 106 lb (48.081 kg)    Usual Body Weight: 94-106 lb   % Usual Body Weight: variable  BMI:  17.4 -  Underweight  Estimated Nutritional Needs: Kcal: 1500-1700 Protein: 75-85 grams Fluid: >1.5 L/day  Skin:  Left lower thigh open wounds Right thigh closed wounds with eschar  Diet Order: General Meal Completion: 30%  EDUCATION NEEDS: -No education needs identified at this time   Intake/Output Summary (Last 24 hours) at 09/25/13 0919 Last data filed at 09/25/13 0730  Gross per 24 hour  Intake    723 ml  Output    800 ml  Net    -77 ml    Last BM: PTA   Labs:   Recent Labs Lab 09/24/13 1352 09/25/13 0500  NA 138 136  K 4.0 3.6  CL 101 100  CO2 25 24  BUN 11 13  CREATININE 0.63 0.65  CALCIUM 9.4 9.1  GLUCOSE 109* 95    CBG (last 3)  No results found for this basename: GLUCAP,  in the last 72 hours  Scheduled Meds: . amantadine  100 mg Oral BID  . buPROPion  100 mg Oral BID  . docusate sodium  100 mg Oral BID  . heparin  5,000 Units Subcutaneous Q8H  . sodium chloride  3 mL Intravenous Q12H  . sodium chloride  3 mL Intravenous Q12H    Continuous Infusions:   Past Medical History  Diagnosis Date  . 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 Followed by Dr. Louanne Skye  . Hypertension   . Cocaine abuse     ongoing with resultant vaculitis.  . Tobacco abuse   . Depression   . Normocytic anemia     BL Hgb 9.8-12. Last anemia panel 04/2010 - showing Fe 19, ferritin 101.  Pt on monthly B12 injections  . Rheumatoid arthritis(714.0)     patient reported  .  Inflammatory arthritis     Past Surgical History  Procedure Laterality Date  . Skin biopsy      bilateral shin nodules on 04/2010  . Hernia repair      Maylon Peppers RD, LDN, CNSC [phone] Pager [phone] After Hours Pager

## 2013-09-25 NOTE — Consult Note (Signed)
Reason for Consult: Left and right anterior thigh ulcers Referring Physician: Marrion Coy, MD   [name] is an 52 y.o. female.  HPI: 52 y/o female who was recently at [facility] hearing voices, she says she started having little ulcers on both thighs just above the knees.  She denies any injections, say it has never occurred before.  She has a hx as noted below, she has prior hx of ulceration over cheeks, nose,  and ears.  This has been diagnosed as a vasculitis.  She  Was admitted by Medicine and reports this started 2 weeks ago after ingestion of cocaine.  Going back to the old chart bx from 2010:  1. LEFT EAR, EXCISIONAL BIOPSY: VASCULITIS WITH ULCERATION AND NECROSIS. 2. TONGUE, BASE, BIOPSY: VASCULITIS AND ULCERATION AND NECROSIS.  We have been ask to see and assist with care. Admission labs positive for cocaine, opiates, and tetrahydrocannabinol. WBC 3.5, CMP is normal. Femur films on the left show no gas or foreign body.  Past Medical History  Diagnosis Date  . 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 Followed by Dr. Louanne Skye  . Hypertension   . Cocaine abuse     ongoing with resultant vaculitis.  . Tobacco abuse   . Depression/Recent [facility] hospitalization   . Normocytic anemia     BL Hgb 9.8-12. Last anemia panel 04/2010 - showing Fe 19, ferritin 101.  Pt on monthly B12 injections  . Rheumatoid arthritis(714.0)     patient reported  . Inflammatory arthritis     Past Surgical History  Procedure Laterality Date  . Skin biopsy      bilateral shin nodules on 04/2010  . Hernia repair      Family History  Problem Relation Age of Onset  . Breast cancer Mother     Breast cancer  . Alcohol abuse Mother   . Colon cancer Maternal Aunt 86  . Alcohol abuse Father     Social History:  reports that she has been smoking Cigarettes.  She has a 19.5 pack-year smoking history. She has never used smokeless tobacco. She reports that she uses illicit drugs ("Crack" cocaine, Cocaine, and  Marijuana). She reports that she does not drink alcohol.  Allergies:  Allergies  Allergen Reactions  . Acetaminophen Swelling    REACTION: eyelid swelling    Medications:  Prior to Admission:  Prescriptions prior to admission  Medication Sig Dispense Refill  . amantadine (SYMMETREL) 100 MG capsule Take 1 capsule (100 mg total) by mouth 2 (two) times daily. For cravings of cocaine.  60 capsule  0  . buPROPion (WELLBUTRIN) 100 MG tablet Take 1 tablet (100 mg total) by mouth 2 (two) times daily. For depression related to cocaine use.  60 tablet  0  . traZODone (DESYREL) 50 MG tablet Take one tablet at bedtime for insomnia.  15 tablet  0   Scheduled: . amantadine  100 mg Oral BID  . buPROPion  100 mg Oral BID  . docusate sodium  100 mg Oral BID  . feeding supplement (ENSURE COMPLETE)  237 mL Oral TID BM  . heparin  5,000 Units Subcutaneous Q8H  . multivitamin with minerals  1 tablet Oral Daily  . sodium chloride  3 mL Intravenous Q12H  . sodium chloride  3 mL Intravenous Q12H   Continuous:  HFW:YOVZCH chloride, HYDROmorphone (DILAUDID) injection, ondansetron (ZOFRAN) IV, ondansetron, sodium chloride, traZODone Anti-infectives   None      Results for orders placed during the hospital  encounter of 09/24/13 (from the past 48 hour(s))  CBC WITH DIFFERENTIAL     Status: Abnormal   Collection Time    09/24/13  1:52 PM      Result Value Range   WBC 3.5 (*) 4.0 - 10.5 K/uL   RBC 4.56  3.87 - 5.11 MIL/uL   Hemoglobin 13.6  12.0 - 15.0 g/dL   HCT 39.0  36.0 - 46.0 %   MCV 85.5  78.0 - 100.0 fL   MCH 29.8  26.0 - 34.0 pg   MCHC 34.9  30.0 - 36.0 g/dL   RDW 13.9  11.5 - 15.5 %   Platelets 259  150 - 400 K/uL   Neutrophils Relative % 62  43 - 77 %   Neutro Abs 2.2  1.7 - 7.7 K/uL   Lymphocytes Relative 36  12 - 46 %   Lymphs Abs 1.3  0.7 - 4.0 K/uL   Monocytes Relative 1 (*) 3 - 12 %   Monocytes Absolute 0.0 (*) 0.1 - 1.0 K/uL   Eosinophils Relative 1  0 - 5 %   Eosinophils  Absolute 0.0  0.0 - 0.7 K/uL   Basophils Relative 0  0 - 1 %   Basophils Absolute 0.0  0.0 - 0.1 K/uL  COMPREHENSIVE METABOLIC PANEL     Status: Abnormal   Collection Time    09/24/13  1:52 PM      Result Value Range   Sodium 138  135 - 145 mEq/L   Potassium 4.0  3.5 - 5.1 mEq/L   Chloride 101  96 - 112 mEq/L   CO2 25  19 - 32 mEq/L   Glucose, Bld 109 (*) 70 - 99 mg/dL   BUN 11  6 - 23 mg/dL   Creatinine, Ser 0.63  0.50 - 1.10 mg/dL   Calcium 9.4  8.4 - 10.5 mg/dL   Total Protein 7.6  6.0 - 8.3 g/dL   Albumin 3.7  3.5 - 5.2 g/dL   AST 25  0 - 37 U/L   ALT 12  0 - 35 U/L   Alkaline Phosphatase 85  39 - 117 U/L   Total Bilirubin 0.6  0.3 - 1.2 mg/dL   GFR calc non Af Amer >90  >90 mL/min   GFR calc Af Amer >90  >90 mL/min   Comment: (NOTE)     The eGFR has been calculated using the CKD EPI equation.     This calculation has not been validated in all clinical situations.     eGFR's persistently <90 mL/min signify possible Chronic Kidney     Disease.  LACTIC ACID, PLASMA     Status: None   Collection Time    09/24/13  1:52 PM      Result Value Range   Lactic Acid, Venous 1.1  0.5 - 2.2 mmol/L  CULTURE, BLOOD (ROUTINE X 2)     Status: None   Collection Time    09/24/13  1:52 PM      Result Value Range   Specimen Description BLOOD RIGHT ARM     Special Requests BOTTLES DRAWN AEROBIC AND ANAEROBIC 5CC     Culture  Setup Time       Value: 09/24/2013 17:55     Performed at [facility]   Culture       Value:        BLOOD CULTURE RECEIVED NO GROWTH TO DATE CULTURE WILL BE HELD FOR 5 DAYS BEFORE ISSUING A  FINAL NEGATIVE REPORT     Performed at [facility]   Report Status PENDING    URINE RAPID DRUG SCREEN (HOSP PERFORMED)     Status: Abnormal   Collection Time    09/24/13  1:58 PM      Result Value Range   Opiates POSITIVE (*) NONE DETECTED   Cocaine POSITIVE (*) NONE DETECTED   Benzodiazepines NONE DETECTED  NONE DETECTED   Amphetamines NONE DETECTED  NONE  DETECTED   Tetrahydrocannabinol POSITIVE (*) NONE DETECTED   Barbiturates NONE DETECTED  NONE DETECTED   Comment:            DRUG SCREEN FOR MEDICAL PURPOSES     ONLY.  IF CONFIRMATION IS NEEDED     FOR ANY PURPOSE, NOTIFY LAB     WITHIN 5 DAYS.                LOWEST DETECTABLE LIMITS     FOR URINE DRUG SCREEN     Drug Class       Cutoff (ng/mL)     Amphetamine      1000     Barbiturate      200     Benzodiazepine   836     Tricyclics       629     Opiates          300     Cocaine          300     THC              50  URINALYSIS, ROUTINE W REFLEX MICROSCOPIC     Status: Abnormal   Collection Time    09/24/13  1:58 PM      Result Value Range   Color, Urine AMBER (*) YELLOW   Comment: BIOCHEMICALS MAY BE AFFECTED BY COLOR   APPearance CLOUDY (*) CLEAR   Specific Gravity, Urine 1.030  1.005 - 1.030   pH 6.0  5.0 - 8.0   Glucose, UA NEGATIVE  NEGATIVE mg/dL   Hgb urine dipstick NEGATIVE  NEGATIVE   Bilirubin Urine SMALL (*) NEGATIVE   Ketones, ur NEGATIVE  NEGATIVE mg/dL   Protein, ur NEGATIVE  NEGATIVE mg/dL   Urobilinogen, UA 1.0  0.0 - 1.0 mg/dL   Nitrite NEGATIVE  NEGATIVE   Leukocytes, UA SMALL (*) NEGATIVE  URINE MICROSCOPIC-ADD ON     Status: Abnormal   Collection Time    09/24/13  1:58 PM      Result Value Range   Squamous Epithelial / LPF MANY (*) RARE   WBC, UA 3-6  <3 WBC/hpf   Bacteria, UA FEW (*) RARE  CULTURE, BLOOD (ROUTINE X 2)     Status: None   Collection Time    09/24/13  1:59 PM      Result Value Range   Specimen Description BLOOD LEFT ARM     Special Requests BOTTLES DRAWN AEROBIC AND ANAEROBIC 4CC     Culture  Setup Time       Value: 09/24/2013 17:55     Performed at [facility]   Culture       Value:        BLOOD CULTURE RECEIVED NO GROWTH TO DATE CULTURE WILL BE HELD FOR 5 DAYS BEFORE ISSUING A FINAL NEGATIVE REPORT     Performed at [facility]   Report Status PENDING    WOUND CULTURE     Status: None   Collection  Time     09/24/13  2:05 PM      Result Value Range   Specimen Description LEG LOWER WOUND     Special Requests Immunocompromised     Gram Stain PENDING     Culture       Value: Culture reincubated for better growth     Performed at [facility]   Report Status PENDING    WOUND CULTURE     Status: None   Collection Time    09/24/13  2:22 PM      Result Value Range   Specimen Description WOUND UPPER WOUND.     Special Requests Immunocompromised     Gram Stain PENDING     Culture       Value: Culture reincubated for better growth     Performed at [facility]   Report Status PENDING    MRSA PCR SCREENING     Status: None   Collection Time    09/24/13  7:51 PM      Result Value Range   MRSA by PCR NEGATIVE  NEGATIVE   Comment:            The GeneXpert MRSA Assay (FDA     approved for NASAL specimens     only), is one component of a     comprehensive MRSA colonization     surveillance program. It is not     intended to diagnose MRSA     infection nor to guide or     monitor treatment for     MRSA infections.  BASIC METABOLIC PANEL     Status: None   Collection Time    09/25/13  5:00 AM      Result Value Range   Sodium 136  135 - 145 mEq/L   Potassium 3.6  3.5 - 5.1 mEq/L   Chloride 100  96 - 112 mEq/L   CO2 24  19 - 32 mEq/L   Glucose, Bld 95  70 - 99 mg/dL   BUN 13  6 - 23 mg/dL   Creatinine, Ser 0.65  0.50 - 1.10 mg/dL   Calcium 9.1  8.4 - 10.5 mg/dL   GFR calc non Af Amer >90  >90 mL/min   GFR calc Af Amer >90  >90 mL/min   Comment: (NOTE)     The eGFR has been calculated using the CKD EPI equation.     This calculation has not been validated in all clinical situations.     eGFR's persistently <90 mL/min signify possible Chronic Kidney     Disease.  CBC     Status: Abnormal   Collection Time    09/25/13  5:00 AM      Result Value Range   WBC 3.5 (*) 4.0 - 10.5 K/uL   RBC 3.88  3.87 - 5.11 MIL/uL   Hemoglobin 11.6 (*) 12.0 - 15.0 g/dL   HCT 34.3 (*)  36.0 - 46.0 %   MCV 88.4  78.0 - 100.0 fL   MCH 29.9  26.0 - 34.0 pg   MCHC 33.8  30.0 - 36.0 g/dL   RDW 14.1  11.5 - 15.5 %   Platelets 218  150 - 400 K/uL    Dg Femur Left  09/24/2013   CLINICAL DATA:  Two sores  EXAM: LEFT FEMUR - 2 VIEW  COMPARISON:  04/11/2010  FINDINGS: There is no evidence of fracture or other focal bone lesions. Soft tissue irregularity anterior to the  distal femoral shaft. No radiodense foreign body or subcutaneous gas.  IMPRESSION: No bone abnormality, subcutaneous gas, or foreign body.   Electronically Signed   By: Arne Cleveland M.D.   On: 09/24/2013 15:04    Review of Systems  Constitutional: Positive for fever and chills. Negative for weight loss, malaise/fatigue and diaphoresis.  HENT: Negative.   Eyes: Negative.   Respiratory: Negative.   Cardiovascular: Negative.   Gastrointestinal: Negative.   Genitourinary: Negative.   Musculoskeletal: Negative.   Skin:       See pictures  Neurological: Negative.   Psychiatric/Behavioral:       Just released from [facility], hearing voices she was given something at [facility], topical cream but she does not know what.   Blood pressure 133/81, pulse 82, temperature 98.6 F (37 C), temperature source Oral, resp. rate 19, SpO2 99.00%. Physical Exam  Constitutional: She is oriented to person, place, and time.  Thin frail female, very anxious will not let you touch her lower thigh, especially on the left.  HENT:  Head: Normocephalic and atraumatic.  Nose: Nose normal.  Eyes: Conjunctivae and EOM are normal. Pupils are equal, round, and reactive to light. Right eye exhibits no discharge. Left eye exhibits no discharge.  Neck: Normal range of motion. Neck supple. No JVD present. No tracheal deviation present. No thyromegaly present.  Cardiovascular: Normal rate, regular rhythm and normal heart sounds.  Exam reveals no gallop.   No murmur heard. Respiratory: Effort normal and breath sounds normal. No respiratory  distress. She has no wheezes. She has no rales. She exhibits no tenderness.  GI: Soft. Bowel sounds are normal. She exhibits no distension and no mass. There is no tenderness. There is no rebound and no guarding.  Musculoskeletal: She exhibits tenderness. She exhibits no edema.  She has good motion and flexion on the right, on the left it hurts more to move leg.  Neurological: She is alert and oriented to person, place, and time. No cranial nerve deficit.  Skin:  See pictures below  Psychiatric: She has a normal mood and affect. Her behavior is normal. Judgment and thought content normal.        Assessment/Plan: 1. Necrotic lesions left and right anterior thigh;Right thigh with 4 areas all aprx. 1.0cm x 1.0c x 0,  Left thigh: 4.5cm x 3.0cm x 0.2cm x two ulcerations  This does not look like an abscess. 2. Hx of vascultiis 3.  Ongoing polysubstance use 4.  Hypertension 5.  RA 6.  Hx of depression/recent [facility] admission 7.  Anemia.  Plan:  Dr. Hulen Skains has seen her and she may need to go to the OR tomorrow for debridement/biopsy.   Continue local wound care for now:  Medihoney for the left thigh (antimicrobial and debridement) as well as this will control odor of the wounds. Cover with foam dressings, change every 2 days.  Silicone foam dressing for the right thigh to protect and insulate.  This has been initiated by [place].   Neveah Bang 09/25/2013, 11:22 AM

## 2013-09-25 NOTE — Progress Notes (Signed)
Instructions to hold 0600 dose of heparin for patient's surgery on 12/23, per Earnstine Regal PA.  Will communicate this to oncoming RN for night shift.

## 2013-09-25 NOTE — Consult Note (Signed)
Will take to OR for debridement tomorrow.  Kathryne Eriksson. Dahlia Bailiff, MD, [place] [phone] [facility]

## 2013-09-25 NOTE — Progress Notes (Signed)
Subjective:  Patient continues to have serosanguineous drainage from wound. Pain is better controlled, but still having a lot of pain.  Objective: Vital signs in last 24 hours: Filed Vitals:   09/24/13 1701 09/24/13 1805 09/24/13 2136 09/25/13 0543  BP: 155/112 155/82 168/82 133/81  Pulse: 106 114 89 82  Temp:  99.6 F (37.6 C) 98.2 F (36.8 C) 98.6 F (37 C)  TempSrc:  Oral Axillary Oral  Resp: 14 18 19 19   SpO2: 100% 99% 99% 99%   Weight change:   Intake/Output Summary (Last 24 hours) at 09/25/13 1213 Last data filed at 09/25/13 0730  Gross per 24 hour  Intake    723 ml  Output    800 ml  Net    -77 ml   Physical Exam  Constitutional: She is oriented to person, place, and time.  Frail tearful middle aged woman that appears older than her stated age.  HENT:  Head: Normocephalic.  Mouth/Throat: Oropharynx is clear and moist. No oropharyngeal exudate.  Eyes: EOM are normal. Pupils are equal, round, and reactive to light.  Cardiovascular: Regular rhythm and normal heart sounds. Exam reveals no gallop and no friction rub.  No murmur heard. Tachycardic, but very emotional  Pulmonary/Chest: Effort normal and breath sounds normal. No respiratory distress. She has no wheezes. She has no rales.  Abdominal: Soft. Bowel sounds are normal. She exhibits no distension. There is no tenderness.  Neurological: She is alert and oriented to person, place, and time.  Skin:      Two necrotic wounds on the left anterior thigh. They are approximately 3 x 3 cm. There is no surrounding erythema or purulent drainage.   There are a number or purpuric rashes on the right anterior thigh with 1-2 mm surrounding erythema.    Lab Results: Basic Metabolic Panel:  Recent Labs Lab 09/24/13 1352 09/25/13 0500  NA 138 136  K 4.0 3.6  CL 101 100  CO2 25 24  GLUCOSE 109* 95  BUN 11 13  CREATININE 0.63 0.65  CALCIUM 9.4 9.1   Liver Function Tests:  Recent Labs Lab 09/24/13 1352    AST 25  ALT 12  ALKPHOS 85  BILITOT 0.6  PROT 7.6  ALBUMIN 3.7   CBC:  Recent Labs Lab 09/24/13 1352 09/25/13 0500  WBC 3.5* 3.5*  NEUTROABS 2.2  --   HGB 13.6 11.6*  HCT 39.0 34.3*  MCV 85.5 88.4  PLT 259 218   Urine Drug Screen: Drugs of Abuse     Component Value Date/Time   LABOPIA POSITIVE* 09/24/2013 1358   LABOPIA PPS 08/31/2012 1045   COCAINSCRNUR POSITIVE* 09/24/2013 1358   COCAINSCRNUR NEG 08/31/2012 1045   LABBENZ NONE DETECTED 09/24/2013 1358   LABBENZ NEG 08/31/2012 1045   LABBENZ NEGATIVE 06/12/2011 0221   AMPHETMU NONE DETECTED 09/24/2013 1358   AMPHETMU NEGATIVE 06/12/2011 0221   THCU POSITIVE* 09/24/2013 1358   THCU 63.4* 05/20/2012 1405   LABBARB NONE DETECTED 09/24/2013 1358   LABBARB NEG 08/31/2012 1045    Urinalysis:  Recent Labs Lab 09/24/13 1358  COLORURINE AMBER*  LABSPEC 1.030  PHURINE 6.0  GLUCOSEU NEGATIVE  HGBUR NEGATIVE  BILIRUBINUR SMALL*  KETONESUR NEGATIVE  PROTEINUR NEGATIVE  UROBILINOGEN 1.0  NITRITE NEGATIVE  LEUKOCYTESUR SMALL*    Micro Results: Recent Results (from the past 240 hour(s))  CULTURE, BLOOD (ROUTINE X 2)     Status: None   Collection Time    09/24/13  1:52 PM  Result Value Range Status   Specimen Description BLOOD RIGHT ARM   Final   Special Requests BOTTLES DRAWN AEROBIC AND ANAEROBIC 5CC   Final   Culture  Setup Time     Final   Value: 09/24/2013 17:55     Performed at [facility]   Culture     Final   Value:        BLOOD CULTURE RECEIVED NO GROWTH TO DATE CULTURE WILL BE HELD FOR 5 DAYS BEFORE ISSUING A FINAL NEGATIVE REPORT     Performed at [facility]   Report Status PENDING   Incomplete  CULTURE, BLOOD (ROUTINE X 2)     Status: None   Collection Time    09/24/13  1:59 PM      Result Value Range Status   Specimen Description BLOOD LEFT ARM   Final   Special Requests BOTTLES DRAWN AEROBIC AND ANAEROBIC 4CC   Final   Culture  Setup Time     Final   Value: 09/24/2013  17:55     Performed at [facility]   Culture     Final   Value:        BLOOD CULTURE RECEIVED NO GROWTH TO DATE CULTURE WILL BE HELD FOR 5 DAYS BEFORE ISSUING A FINAL NEGATIVE REPORT     Performed at [facility]   Report Status PENDING   Incomplete  WOUND CULTURE     Status: None   Collection Time    09/24/13  2:05 PM      Result Value Range Status   Specimen Description LEG LOWER WOUND   Final   Special Requests Immunocompromised   Final   Gram Stain PENDING   Incomplete   Culture     Final   Value: Culture reincubated for better growth     Performed at [facility]   Report Status PENDING   Incomplete  WOUND CULTURE     Status: None   Collection Time    09/24/13  2:22 PM      Result Value Range Status   Specimen Description WOUND UPPER WOUND.   Final   Special Requests Immunocompromised   Final   Gram Stain PENDING   Incomplete   Culture     Final   Value: Culture reincubated for better growth     Performed at [facility]   Report Status PENDING   Incomplete  MRSA PCR SCREENING     Status: None   Collection Time    09/24/13  7:51 PM      Result Value Range Status   MRSA by PCR NEGATIVE  NEGATIVE Final   Comment:            The GeneXpert MRSA Assay (FDA     approved for NASAL specimens     only), is one component of a     comprehensive MRSA colonization     surveillance program. It is not     intended to diagnose MRSA     infection nor to guide or     monitor treatment for     MRSA infections.   Studies/Results: Dg Femur Left  09/24/2013   CLINICAL DATA:  Two sores  EXAM: LEFT FEMUR - 2 VIEW  COMPARISON:  04/11/2010  FINDINGS: There is no evidence of fracture or other focal bone lesions. Soft tissue irregularity anterior to the distal femoral shaft. No radiodense foreign body or subcutaneous gas.  IMPRESSION: No bone  abnormality, subcutaneous gas, or foreign body.   Electronically Signed   By: Arne Cleveland M.D.   On: 09/24/2013  15:04   Medications: I have reviewed the patient's current medications. Scheduled Meds: . amantadine  100 mg Oral BID  . buPROPion  100 mg Oral BID  . docusate sodium  100 mg Oral BID  . feeding supplement (ENSURE COMPLETE)  237 mL Oral TID BM  . heparin  5,000 Units Subcutaneous Q8H  . multivitamin with minerals  1 tablet Oral Daily  . sodium chloride  3 mL Intravenous Q12H  . 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 The wound has scant serosanguinous discharge at this time. No purulence. No surrounding erythema indicative of infection at this time.  - IV dilaudid for pain control will add on PO meds today - Wound care consult  - General surgery consult in AM, plan for possible debridement tomorrow in OR - Low threshold to start abx if patient appears septic (unstable vital signs) or spikes a fever   Polysubstance Drug Use  SW consult for resources. Patient denies Use in the last two weeks, but UDS positive for cocaine.  - continue amantadine 100 mg BID for cocaine cravings  - F/U HIV   MDD c/b SI  Patient denies SI at this time. Will reevaulaute prior to discharge. Continue home meds. (bupropion and trazodone).   DVT: sq heparin   Diet: Regular      Dispo: Disposition is deferred at this time, awaiting improvement of current medical problems.  Anticipated discharge in approximately 2- day(s).   The patient does have a current PCP  (Charlann Lange, MD) and does need an [facility] hospital follow-up appointment after discharge.  The patient does have transportation limitations that hinder transportation to clinic appointments.  .Services Needed at time of discharge: Y = Yes, Blank = No PT:   OT:   RN:   Equipment:   Other:     LOS: 1 day   Marrion Coy, MD 09/25/2013, 12:13 PM

## 2013-09-25 NOTE — Consult Note (Signed)
WOC wound consult note Reason for Consult: pt well known to this [facility] nurse from multiple previous admissions.  Pt last seen in Nov. During [facility] admission at which time she did have wounds that are currently present but they were not open at that time.   She has history of ulcerations over her cheeks, ears and nose tip that we have healed in the past with topical care.  She reports she has not used crack since dc from [facility]-toxscreen positive.  Wound type: levamisole vasculitis, open lesions on the left thigh, closed wounds with eschar of the right thigh  Measurement:  Right thigh with 4 areas all aprx. 1.0cm x 1.0c x 0 Left thigh: 4.5cm x 3.0cm x 0.2cm x two ulcerations Wound YDS:WVTV thigh, 100% slough, wet with foul odor Drainage (amount, consistency, odor) moderate left thigh, none from her right thigh Periwound: minimal noted amount of erythema at the left and right thigh wounds Dressing procedure/placement/frequency: Medihoney for the left thigh (antimicrobial and debridement) as well as this will control odor of the wounds.  Cover with foam dressings, change every 2 days. Silicone foam dressing for the right thigh to protect and insulate.  I do not think this patient needs plastic surgery, these have healed in the past with topical wound care and if she continues to use the drugs they will continue to be present and may appear at other locations.  I have provided samples of wound care products as she has been homeless in the past, she can continue to perform wound care as she has in the past with these samples with hopes like her face/ears/and nose can heal.    Discussed POC with patient and bedside nurse.  Re consult if needed, will not follow at this time. Thanks  Sipriano Fendley [place]ogg, [place] [phone])

## 2013-09-25 NOTE — H&P (Signed)
  Date: 09/25/2013  Patient name: [name]  Medical record number: [id number]  Date of birth: May 28, 1961   I have seen and evaluated [name] and discussed their care with the Residency Team. Ms. Cardona is a 52yo woman with PMH of cocaine use and levimasole vasculitis who presents with new, exquisitely tender and necrotic wounds to her left thigh.  She also has some developing wounds on her right thigh.  These are similar to previous episodes of vasculitis and started about 2 weeks ago after using cocaine.   Assessment and Plan: I have seen and evaluated the patient as outlined above. I agree with the formulated Assessment and Plan as detailed in the residents' admission note, with the following changes:   1. Levimasole induced vasculitis with acute wounds: WOC will be consulted, pain control with IV medications to start with transition to PO when tolerated.  Consider surgery consult to evaluate need for wound debridement. Strong encouragement to discontinue illicit drug use.   2. Leukopenia, mild: Will check HIV  Other issues per resident note.   Sid Falcon, MD 12/22/20148:41 AM

## 2013-09-25 NOTE — Progress Notes (Signed)
Utilization review completed.  

## 2013-09-26 ENCOUNTER — Inpatient Hospital Stay (HOSPITAL_COMMUNITY): Payer: Medicaid Other | Admitting: Critical Care Medicine

## 2013-09-26 ENCOUNTER — Encounter (HOSPITAL_COMMUNITY)
Admission: EM | Disposition: A | Discharge status: Home Health | Payer: Self-pay | Source: Home / Self Care | Attending: Internal Medicine

## 2013-09-26 ENCOUNTER — Encounter (HOSPITAL_COMMUNITY): Payer: Medicaid Other | Admitting: Critical Care Medicine

## 2013-09-26 ENCOUNTER — Encounter (HOSPITAL_COMMUNITY): Payer: Self-pay | Admitting: *Deleted

## 2013-09-26 DIAGNOSIS — F1910 Other psychoactive substance abuse, uncomplicated: Secondary | ICD-10-CM

## 2013-09-26 DIAGNOSIS — F329 Major depressive disorder, single episode, unspecified: Secondary | ICD-10-CM

## 2013-09-26 DIAGNOSIS — L97109 Non-pressure chronic ulcer of unspecified thigh with unspecified severity: Secondary | ICD-10-CM

## 2013-09-26 HISTORY — PX: IRRIGATION AND DEBRIDEMENT ABSCESS: SHX5252

## 2013-09-26 SURGERY — IRRIGATION AND DEBRIDEMENT ABSCESS
Anesthesia: General | Laterality: Bilateral

## 2013-09-26 MED ORDER — LACTATED RINGERS IV SOLN
INTRAVENOUS | Status: DC
  Administered 2013-09-26: 16:00:00 via INTRAVENOUS
  Administered 2013-09-27: 50 mL/h via INTRAVENOUS

## 2013-09-26 MED ORDER — PHENYLEPHRINE HCL 10 MG/ML IJ SOLN
INTRAMUSCULAR | Status: DC | PRN
  Administered 2013-09-26: 120 ug via INTRAVENOUS

## 2013-09-26 MED ORDER — PROMETHAZINE HCL 25 MG/ML IJ SOLN: 6.2500 mg | INTRAMUSCULAR | Status: DC | PRN

## 2013-09-26 MED ORDER — LIDOCAINE HCL (CARDIAC) 20 MG/ML IV SOLN
INTRAVENOUS | Status: DC | PRN
  Administered 2013-09-26: 40 mg via INTRAVENOUS

## 2013-09-26 MED ORDER — OXYCODONE HCL 5 MG PO TABS: 5.0000 mg | ORAL_TABLET | Freq: Once | ORAL | Status: AC | PRN

## 2013-09-26 MED ORDER — PROPOFOL 10 MG/ML IV BOLUS
INTRAVENOUS | Status: DC | PRN
  Administered 2013-09-26: 150 mg via INTRAVENOUS

## 2013-09-26 MED ORDER — OXYCODONE HCL 5 MG/5ML PO SOLN
5.0000 mg | Freq: Once | ORAL | Status: AC | PRN
Start: 2013-09-26 — End: 2013-09-26
  Administered 2013-09-26: 5 mg via ORAL

## 2013-09-26 MED ORDER — FENTANYL CITRATE 0.05 MG/ML IJ SOLN
INTRAMUSCULAR | Status: DC | PRN
  Administered 2013-09-26 (×2): 50 ug via INTRAVENOUS

## 2013-09-26 MED ORDER — HYDROMORPHONE HCL PF 1 MG/ML IJ SOLN: 0.2500 mg | INTRAMUSCULAR | Status: DC | PRN

## 2013-09-26 MED ORDER — 0.9 % SODIUM CHLORIDE (POUR BTL) OPTIME
TOPICAL | Status: DC | PRN
  Administered 2013-09-26: 1000 mL

## 2013-09-26 MED ORDER — OXYCODONE HCL 5 MG/5ML PO SOLN
ORAL | Status: AC
  Filled 2013-09-26: qty 5

## 2013-09-26 MED ORDER — CEFAZOLIN SODIUM 1-5 GM-% IV SOLN
1.0000 g | Freq: Once | INTRAVENOUS | Status: AC
  Administered 2013-09-26: 2 g via INTRAVENOUS
  Filled 2013-09-26: qty 50

## 2013-09-26 MED ORDER — MIDAZOLAM HCL 5 MG/5ML IJ SOLN
INTRAMUSCULAR | Status: DC | PRN
  Administered 2013-09-26: 2 mg via INTRAVENOUS

## 2013-09-26 MED ORDER — ONDANSETRON HCL 4 MG/2ML IJ SOLN
INTRAMUSCULAR | Status: DC | PRN
  Administered 2013-09-26: 4 mg via INTRAVENOUS

## 2013-09-26 MED ORDER — CEFAZOLIN SODIUM-DEXTROSE 2-3 GM-% IV SOLR
INTRAVENOUS | Status: AC
  Filled 2013-09-26: qty 50

## 2013-09-26 SURGICAL SUPPLY — 24 items
BANDAGE GAUZE ELAST BULKY 4 IN (GAUZE/BANDAGES/DRESSINGS) ×1 IMPLANT
CANISTER SUCTION 2500CC (MISCELLANEOUS) ×2 IMPLANT
COVER SURGICAL LIGHT HANDLE (MISCELLANEOUS) ×2 IMPLANT
DRAPE LAPAROSCOPIC ABDOMINAL (DRAPES) ×1 IMPLANT
DRAPE PED LAPAROTOMY (DRAPES) IMPLANT
DRAPE UTILITY 15X26 W/TAPE STR (DRAPE) ×4 IMPLANT
DRSG PAD ABDOMINAL 8X10 ST (GAUZE/BANDAGES/DRESSINGS) IMPLANT
ELECT CAUTERY BLADE 6.4 (BLADE) ×2 IMPLANT
ELECT REM PT RETURN 9FT ADLT (ELECTROSURGICAL) ×2
ELECTRODE REM PT RTRN 9FT ADLT (ELECTROSURGICAL) ×1 IMPLANT
GLOVE BIOGEL PI IND STRL 8 (GLOVE) ×1 IMPLANT
GLOVE BIOGEL PI INDICATOR 8 (GLOVE) ×1
GLOVE ECLIPSE 8.0 STRL XLNG CF (GLOVE) ×2 IMPLANT
GOWN STRL NON-REIN LRG LVL3 (GOWN DISPOSABLE) ×4 IMPLANT
KIT BASIN OR (CUSTOM PROCEDURE TRAY) ×2 IMPLANT
KIT ROOM TURNOVER OR (KITS) ×2 IMPLANT
NS IRRIG 1000ML POUR BTL (IV SOLUTION) ×2 IMPLANT
PACK GENERAL/GYN (CUSTOM PROCEDURE TRAY) ×2 IMPLANT
PAD ARMBOARD 7.5X6 YLW CONV (MISCELLANEOUS) ×2 IMPLANT
SPONGE GAUZE 4X4 12PLY (GAUZE/BANDAGES/DRESSINGS) ×1 IMPLANT
SWAB COLLECTION DEVICE MRSA (MISCELLANEOUS) IMPLANT
TOWEL OR 17X24 6PK STRL BLUE (TOWEL DISPOSABLE) ×2 IMPLANT
TOWEL OR 17X26 10 PK STRL BLUE (TOWEL DISPOSABLE) ×2 IMPLANT
TUBE ANAEROBIC SPECIMEN COL (MISCELLANEOUS) ×1 IMPLANT

## 2013-09-26 NOTE — Progress Notes (Signed)
To the OR today for debridement.  [name]. Dahlia Bailiff, MD, [place] [phone] [facility] Surgery

## 2013-09-26 NOTE — Progress Notes (Signed)
After unhooking pt from the monitor and getting ready to go to her room, pt started crying and stated that her pain was a 10/10. I pulled narc out of pyxis and gave PO dose  Before leaving PACU.

## 2013-09-26 NOTE — Progress Notes (Signed)
  Date: 09/26/2013  Patient name: [name]  Medical record number: [id number]  Date of birth: 12-11-1960   This patient has been seen and the plan of care was discussed with the house staff. Please see their note for complete details. I concur with their findings with the following additions/corrections:  Pain mildly better controlled today.  Plan for OR with debridement.   Sid Falcon, MD 09/26/2013, 12:57 PM

## 2013-09-26 NOTE — Op Note (Signed)
OPERATIVE REPORT  DATE OF OPERATION: 09/24/2013 - 09/26/2013  PATIENT:  [name]  52 y.o. female  PRE-OPERATIVE DIAGNOSIS:  Ulcers left and right thighs  POST-OPERATIVE DIAGNOSIS:  leg ulcers   PROCEDURE:  Procedure(s): DEBRIDEMENT ULCERS BILATERAL THIGHS  SURGEON:  Surgeon(s): Gwenyth Ober, MD  ASSISTANT: None  ANESTHESIA:   general with LMA  EBL: <30 ml  BLOOD ADMINISTERED: none  DRAINS: none   SPECIMEN:  Source of Specimen:  Necrotic tissue for culture, and debrided wound for pathology  COUNTS CORRECT:  YES  PROCEDURE DETAILS: The patient was taken to the operating room and placed on the table in supine position. After an adequate general laryngeal airway anesthetic was administered she was prepped and draped in usual sterile manner exposing both anterior thighs.  After proper time out was performed identifying the patient and the procedure to be performed we used a 15 blade to debride the ulcerated areas of both thighs anteriorly. On the left thigh she had a 5 cm circular ulceration with some indurated surrounding tissue just above a 3 cm circular ulcer. Both were debrided down to healthy granulating bleeding tissue and then dressed with wet-to-dry dressings with Kerlix and 4 x 4 gauze and sterile saline.  On the right thigh there was a area of necrosis which was excised which did not ulcerated. This represented a 3 x 1 cm area which was subsequently dressed with wet-to-dry saline and 4 x 4 and Kerlix dressing. All counts were correct.  PATIENT DISPOSITION:  PACU - hemodynamically stable.   Gwenyth Ober 12/23/20146:20 PM

## 2013-09-26 NOTE — Anesthesia Postprocedure Evaluation (Signed)
  Anesthesia Post-op Note  Patient: [name]  Procedure(s) Performed: Procedure(s): DEBRIDEMENT ULCERS BILATERAL THIGHS (Bilateral)  Patient Location: PACU  Anesthesia Type:General  Level of Consciousness: awake  Airway and Oxygen Therapy: Patient Spontanous Breathing  Post-op Pain: mild  Post-op Assessment: Post-op Vital signs reviewed, Patient's Cardiovascular Status Stable, Respiratory Function Stable, Patent Airway, No signs of Nausea or vomiting and Pain level controlled  Post-op Vital Signs: Reviewed and stable  Complications: No apparent anesthesia complications

## 2013-09-26 NOTE — Progress Notes (Signed)
Will take to the OR for debridement  [name]. Dahlia Bailiff, MD, [place] [phone] [facility] Surgery

## 2013-09-26 NOTE — Progress Notes (Addendum)
Subjective:  [name]. Patient continues to have serosanguineous drainage from wounds. Patient still complains of pain. She is excited for OR today.  Objective: Vital signs in last 24 hours: Filed Vitals:   09/25/13 0543 09/25/13 1330 09/25/13 2116 09/26/13 0519  BP: 133/81 138/78 147/96 138/68  Pulse: 82 81 83 80  Temp: 98.6 F (37 C) 98.4 F (36.9 C) 98.3 F (36.8 C) 98.2 F (36.8 C)  TempSrc: Oral  Oral Oral  Resp: 19 16 18 19   SpO2: 99% 100% 99% 99%   Weight change:   Intake/Output Summary (Last 24 hours) at 09/26/13 0734 Last data filed at 09/26/13 0700  Gross per 24 hour  Intake    480 ml  Output    250 ml  Net    230 ml   Physical Exam  Constitutional: She is oriented to person, place, and time.  Frail middle aged woman. HENT:  Head: Normocephalic.  Mouth/Throat: Oropharynx is clear and moist. No oropharyngeal exudate.  Eyes: EOM are normal. Pupils are equal, round, and reactive to light.  Cardiovascular: Regular rhythm and normal heart sounds. Exam reveals no gallop and no friction rub.  No murmur heard. Pulmonary/Chest: Effort normal and breath sounds normal. No respiratory distress. She has no wheezes. She has no rales.  Abdominal: Soft. Bowel sounds are normal. She exhibits no distension. There is no tenderness.  Neurological: She is alert and oriented to person, place, and time.  Skin:     Two necrotic wounds on the left anterior thigh. They are approximately 3 x 3 cm. There is no surrounding erythema or purulent drainage.   There are a number or purpuric rashes on the right anterior thigh with 1-2 mm surrounding erythema.    Lab Results: Basic Metabolic Panel:  Recent Labs Lab 09/24/13 1352 09/25/13 0500  NA 138 136  K 4.0 3.6  CL 101 100  CO2 25 24  GLUCOSE 109* 95  BUN 11 13  CREATININE 0.63 0.65  CALCIUM 9.4 9.1   Liver Function Tests:  Recent Labs Lab 09/24/13 1352  AST 25  ALT 12  ALKPHOS 85  BILITOT 0.6  PROT 7.6  ALBUMIN  3.7   CBC:  Recent Labs Lab 09/24/13 1352 09/25/13 0500  WBC 3.5* 3.5*  NEUTROABS 2.2  --   HGB 13.6 11.6*  HCT 39.0 34.3*  MCV 85.5 88.4  PLT 259 218   Urine Drug Screen: Drugs of Abuse     Component Value Date/Time   LABOPIA POSITIVE* 09/24/2013 1358   LABOPIA PPS 08/31/2012 1045   COCAINSCRNUR POSITIVE* 09/24/2013 1358   COCAINSCRNUR NEG 08/31/2012 1045   LABBENZ NONE DETECTED 09/24/2013 1358   LABBENZ NEG 08/31/2012 1045   LABBENZ NEGATIVE 06/12/2011 0221   AMPHETMU NONE DETECTED 09/24/2013 1358   AMPHETMU NEGATIVE 06/12/2011 0221   THCU POSITIVE* 09/24/2013 1358   THCU 63.4* 05/20/2012 1405   LABBARB NONE DETECTED 09/24/2013 1358   LABBARB NEG 08/31/2012 1045    Urinalysis:  Recent Labs Lab 09/24/13 1358  COLORURINE AMBER*  LABSPEC 1.030  PHURINE 6.0  GLUCOSEU NEGATIVE  HGBUR NEGATIVE  BILIRUBINUR SMALL*  KETONESUR NEGATIVE  PROTEINUR NEGATIVE  UROBILINOGEN 1.0  NITRITE NEGATIVE  LEUKOCYTESUR SMALL*    Micro Results: Recent Results (from the past 240 hour(s))  CULTURE, BLOOD (ROUTINE X 2)     Status: None   Collection Time    09/24/13  1:52 PM      Result Value Range Status   Specimen Description BLOOD RIGHT  ARM   Final   Special Requests BOTTLES DRAWN AEROBIC AND ANAEROBIC 5CC   Final   Culture  Setup Time     Final   Value: 09/24/2013 17:55     Performed at [facility]   Culture     Final   Value:        BLOOD CULTURE RECEIVED NO GROWTH TO DATE CULTURE WILL BE HELD FOR 5 DAYS BEFORE ISSUING A FINAL NEGATIVE REPORT     Performed at [facility]   Report Status PENDING   Incomplete  URINE CULTURE     Status: None   Collection Time    09/24/13  1:58 PM      Result Value Range Status   Specimen Description URINE, CATHETERIZED   Final   Special Requests NONE   Final   Culture  Setup Time     Final   Value: 09/24/2013 23:11     Performed at [facility]     Final   Value: NO GROWTH     Performed at  [facility]   Culture     Final   Value: NO GROWTH     Performed at [facility]   Report Status 09/25/2013 FINAL   Final  CULTURE, BLOOD (ROUTINE X 2)     Status: None   Collection Time    09/24/13  1:59 PM      Result Value Range Status   Specimen Description BLOOD LEFT ARM   Final   Special Requests BOTTLES DRAWN AEROBIC AND ANAEROBIC 4CC   Final   Culture  Setup Time     Final   Value: 09/24/2013 17:55     Performed at [facility]   Culture     Final   Value:        BLOOD CULTURE RECEIVED NO GROWTH TO DATE CULTURE WILL BE HELD FOR 5 DAYS BEFORE ISSUING A FINAL NEGATIVE REPORT     Performed at [facility]   Report Status PENDING   Incomplete  WOUND CULTURE     Status: None   Collection Time    09/24/13  2:05 PM      Result Value Range Status   Specimen Description LEG LOWER WOUND   Final   Special Requests Immunocompromised   Final   Gram Stain     Final   Value: ABUNDANT WBC PRESENT,BOTH PMN AND MONONUCLEAR     NO SQUAMOUS EPITHELIAL CELLS SEEN     ABUNDANT GRAM NEGATIVE RODS     ABUNDANT GRAM POSITIVE COCCI IN PAIRS     Performed at [facility]     Final   Value: Culture reincubated for better growth     Performed at [facility]   Report Status PENDING   Incomplete  WOUND CULTURE     Status: None   Collection Time    09/24/13  2:22 PM      Result Value Range Status   Specimen Description WOUND UPPER WOUND.   Final   Special Requests Immunocompromised   Final   Gram Stain     Final   Value: ABUNDANT WBC PRESENT,BOTH PMN AND MONONUCLEAR     NO SQUAMOUS EPITHELIAL CELLS SEEN     ABUNDANT GRAM NEGATIVE RODS     ABUNDANT GRAM POSITIVE COCCI IN PAIRS     FEW GRAM POSITIVE RODS   Culture     Final  Value: Culture reincubated for better growth     Performed at [facility]   Report Status PENDING   Incomplete  MRSA PCR SCREENING     Status: None   Collection Time    09/24/13  7:51 PM      Result  Value Range Status   MRSA by PCR NEGATIVE  NEGATIVE Final   Comment:            The GeneXpert MRSA Assay (FDA     approved for NASAL specimens     only), is one component of a     comprehensive MRSA colonization     surveillance program. It is not     intended to diagnose MRSA     infection nor to guide or     monitor treatment for     MRSA infections.   Studies/Results: Dg Femur Left  09/24/2013   CLINICAL DATA:  Two sores  EXAM: LEFT FEMUR - 2 VIEW  COMPARISON:  04/11/2010  FINDINGS: There is no evidence of fracture or other focal bone lesions. Soft tissue irregularity anterior to the distal femoral shaft. No radiodense foreign body or subcutaneous gas.  IMPRESSION: No bone abnormality, subcutaneous gas, or foreign body.   Electronically Signed   By: Arne Cleveland M.D.   On: 09/24/2013 15:04   Medications: I have reviewed the patient's current medications. Scheduled Meds: . amantadine  100 mg Oral BID  . buPROPion  100 mg Oral BID  . docusate sodium  100 mg Oral BID  . feeding supplement (ENSURE COMPLETE)  237 mL Oral TID BM  . multivitamin with minerals  1 tablet Oral Daily  . sodium chloride  3 mL Intravenous Q12H  . 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 The wound has scant serosanguinous discharge at this time. No purulence. No surrounding erythema indicative of infection at this time.  - IV dilaudid and PO oxycodone  IR - Wound care consulted - General surgery consulted rec debridement in OR on 12/23 - Low threshold to start abx if patient appears septic (unstable vital signs) or spikes a fever   Polysubstance Drug Use  SW consult for resources. Patient denies Use in the last two weeks, but UDS positive for cocaine.  - continue amantadine 100 mg BID for cocaine cravings  - HIV = non-reactive  MDD c/b SI  Patient denies SI at this time. Will reevaulaute prior to discharge. Continue home meds. (bupropion and trazodone).   DVT: sq heparin   Diet: Regular      Dispo: Disposition is deferred at this time, awaiting improvement of current medical problems.  Anticipated discharge in approximately 2-3 day(s).   The patient does have a current PCP (Charlann Lange, MD) and does need an [facility] hospital follow-up appointment after discharge.  The patient does have transportation limitations that hinder transportation to clinic appointments.  .Services Needed at time of discharge: Y = Yes, Blank = No PT:   OT:   RN:   Equipment:   Other:  LOS: 2 days   Marrion Coy, MD 09/26/2013, 7:34 AM

## 2013-09-26 NOTE — Anesthesia Preprocedure Evaluation (Signed)
Anesthesia Evaluation  Patient identified by MRN, date of birth, ID band Patient awake    Reviewed: Allergy & Precautions, H&P , NPO status , Patient's Chart, lab work & pertinent test results  Airway Mallampati: II TM Distance: >3 FB Neck ROM: Full    Dental  (+) Edentulous Upper and Edentulous Lower   Pulmonary Current Smoker,    Pulmonary exam normal       Cardiovascular hypertension,     Neuro/Psych PSYCHIATRIC DISORDERS Depression negative neurological ROS     GI/Hepatic negative GI ROS, (+)     substance abuse  cocaine use,   Endo/Other  negative endocrine ROS  Renal/GU negative Renal ROS     Musculoskeletal   Abdominal   Peds  Hematology   Anesthesia Other Findings   Reproductive/Obstetrics                           Anesthesia Physical Anesthesia Plan  ASA: III  Anesthesia Plan: General   Post-op Pain Management:    Induction: Intravenous  Airway Management Planned: LMA  Additional Equipment:   Intra-op Plan:   Post-operative Plan: Extubation in OR  Informed Consent: I have reviewed the patients History and Physical, chart, labs and discussed the procedure including the risks, benefits and alternatives for the proposed anesthesia with the patient or authorized representative who has indicated his/her understanding and acceptance.   Dental advisory given  Plan Discussed with: CRNA, Anesthesiologist and Surgeon  Anesthesia Plan Comments:         Anesthesia Quick Evaluation

## 2013-09-26 NOTE — Transfer of Care (Signed)
Immediate Anesthesia Transfer of Care Note  Patient: [name]  Procedure(s) Performed: Procedure(s): DEBRIDEMENT ULCERS BILATERAL THIGHS (Bilateral)  Patient Location: PACU  Anesthesia Type:General  Level of Consciousness: awake, alert  and oriented  Airway & Oxygen Therapy: Patient Spontanous Breathing and Patient connected to nasal cannula oxygen  Post-op Assessment: Report given to PACU RN, Post -op Vital signs reviewed and stable and Patient moving all extremities X 4  Post vital signs: Reviewed and stable  Complications: No apparent anesthesia complications

## 2013-09-26 NOTE — Preoperative (Signed)
Beta Blockers   Reason not to administer Beta Blockers:Not Applicable 

## 2013-09-27 ENCOUNTER — Encounter (HOSPITAL_COMMUNITY): Payer: Self-pay | Admitting: General Surgery

## 2013-09-27 LAB — WOUND CULTURE

## 2013-09-27 MED ORDER — MORPHINE SULFATE 2 MG/ML IJ SOLN
2.0000 mg | INTRAMUSCULAR | Status: DC | PRN
  Administered 2013-09-27 – 2013-09-28 (×7): 2 mg via INTRAVENOUS
  Filled 2013-09-27 (×7): qty 1

## 2013-09-27 NOTE — Progress Notes (Addendum)
Clinical Social Work Department BRIEF PSYCHOSOCIAL ASSESSMENT 09/27/2013  Patient:  [name], [name]     Account Number:  [id number]     Admit date:  09/24/2013  Clinical Social Worker:  Freeman Caldron  Date/Time:  09/27/2013 08:26 AM  Referred by:  Physician  Date Referred:  09/27/2013 Referred for  Homelessness   Other Referral:   Interview type:  Other - See comment Other interview type:   Read consult    PSYCHOSOCIAL DATA Living Status:  ALONE Admitted from facility:   Level of care:   Primary support name:  Cassie Freer ([phone]) Primary support relationship to patient:  FRIEND Degree of support available:   Poor--believed pt was homeless but she states she has an apartment.    CURRENT CONCERNS Current Concerns  Post-Acute Placement   Other Concerns:    SOCIAL WORK ASSESSMENT / PLAN CSW read consult: "homeless issues--can pt go to ALF?" CSW went to pt's bedside, where MD was talking with pt about placement. Pt states she has an apartment and refuses a skilled facility/ALF.   Assessment/plan status:  Other assessment/ plan:   Information/referral to community resources:   None, N/A.    PATIENT'S/FAMILY'S RESPONSE TO PLAN OF CARE: Pt expressed gratitude to MD for considering her needs but states she does not have any needs and has an apartment. CSW signing off.     Ky Barban, MSW, [facility] Clinical Social Worker [phone]

## 2013-09-27 NOTE — Progress Notes (Signed)
Internal Medicine Attending  Date: 09/27/2013  Patient name: [name] Medical record number: [id number] Date of birth: Dec 11, 1960 Age: 52 y.o. Gender: female  I saw and evaluated the patient. I reviewed the resident's note by Dr. Margart Sickles and I agree with the resident's findings and plans as documented in his note.

## 2013-09-27 NOTE — Progress Notes (Signed)
Patient will continue to have pain, but the wounds look better.  CPM with wet-to-dry dressings bid.  Kathryne Eriksson. Dahlia Bailiff, MD, [place] [phone] [facility]

## 2013-09-27 NOTE — Progress Notes (Signed)
Subjective:  [name]. Patient continues to have severe left anterior leg pain. The drainage has decreased and the wound appears better according to the patient.  Objective: Vital signs in last 24 hours: Filed Vitals:   09/26/13 1925 09/26/13 2011 09/26/13 2210 09/27/13 0630  BP:  145/81 127/64 144/73  Pulse:  81 73 65  Temp: 98.4 F (36.9 C) 98.1 F (36.7 C)  98.4 F (36.9 C)  TempSrc:   Oral Oral  Resp:  18 18 18   SpO2:  96% 99% 100%   Weight change:   Intake/Output Summary (Last 24 hours) at 09/27/13 4765 Last data filed at 09/27/13 0631  Gross per 24 hour  Intake    963 ml  Output    150 ml  Net    813 ml   Physical Exam  Constitutional: She is oriented to person, place, and time. She appears well-developed and well-nourished. No distress.  HENT:  Head: Normocephalic and atraumatic.  Mouth/Throat: No oropharyngeal exudate.  Cardiovascular: Normal rate, regular rhythm, normal heart sounds and intact distal pulses.  Exam reveals no friction rub.   No murmur heard. Pulmonary/Chest: Effort normal and breath sounds normal. No respiratory distress. She has no wheezes. She has no rales.  Abdominal: Soft. Bowel sounds are normal. She exhibits no distension. There is no tenderness. There is no rebound and no guarding.  Neurological: She is alert and oriented to person, place, and time.  Skin: She is not diaphoretic.  Leg wounds are bandaged. Bandages are clean. No signs of purulent or blood drainage.  Psychiatric: She has a normal mood and affect. Her behavior is normal.     Lab Results: Basic Metabolic Panel:  Recent Labs Lab 09/24/13 1352 09/25/13 0500  NA 138 136  K 4.0 3.6  CL 101 100  CO2 25 24  GLUCOSE 109* 95  BUN 11 13  CREATININE 0.63 0.65  CALCIUM 9.4 9.1   Liver Function Tests:  Recent Labs Lab 09/24/13 1352  AST 25  ALT 12  ALKPHOS 85  BILITOT 0.6  PROT 7.6  ALBUMIN 3.7   CBC:  Recent Labs Lab 09/24/13 1352 09/25/13 0500  WBC 3.5*  3.5*  NEUTROABS 2.2  --   HGB 13.6 11.6*  HCT 39.0 34.3*  MCV 85.5 88.4  PLT 259 218  Urine Drug Screen: Drugs of Abuse     Component Value Date/Time   LABOPIA POSITIVE* 09/24/2013 1358   LABOPIA PPS 08/31/2012 1045   COCAINSCRNUR POSITIVE* 09/24/2013 1358   COCAINSCRNUR NEG 08/31/2012 1045   LABBENZ NONE DETECTED 09/24/2013 1358   LABBENZ NEG 08/31/2012 1045   LABBENZ NEGATIVE 06/12/2011 0221   AMPHETMU NONE DETECTED 09/24/2013 1358   AMPHETMU NEGATIVE 06/12/2011 0221   THCU POSITIVE* 09/24/2013 1358   THCU 63.4* 05/20/2012 1405   LABBARB NONE DETECTED 09/24/2013 1358   LABBARB NEG 08/31/2012 1045    Alcohol Level: No results found for this basename: ETH,  in the last 168 hours Urinalysis:  Recent Labs Lab 09/24/13 [place] 1.030  PHURINE 6.0  GLUCOSEU NEGATIVE  HGBUR NEGATIVE  BILIRUBINUR SMALL*  KETONESUR NEGATIVE  PROTEINUR NEGATIVE  UROBILINOGEN 1.0  NITRITE NEGATIVE  LEUKOCYTESUR SMALL*   Micro Results: Recent Results (from the past 240 hour(s))  CULTURE, BLOOD (ROUTINE X 2)     Status: None   Collection Time    09/24/13  1:52 PM      Result Value Range Status   Specimen Description BLOOD RIGHT ARM  Final   Special Requests BOTTLES DRAWN AEROBIC AND ANAEROBIC 5CC   Final   Culture  Setup Time     Final   Value: 09/24/2013 17:55     Performed at [facility]   Culture     Final   Value:        BLOOD CULTURE RECEIVED NO GROWTH TO DATE CULTURE WILL BE HELD FOR 5 DAYS BEFORE ISSUING A FINAL NEGATIVE REPORT     Performed at [facility]   Report Status PENDING   Incomplete  URINE CULTURE     Status: None   Collection Time    09/24/13  1:58 PM      Result Value Range Status   Specimen Description URINE, CATHETERIZED   Final   Special Requests NONE   Final   Culture  Setup Time     Final   Value: 09/24/2013 23:11     Performed at [place]     Final   Value: NO GROWTH     Performed at  [facility]   Culture     Final   Value: NO GROWTH     Performed at [facility]   Report Status 09/25/2013 FINAL   Final  CULTURE, BLOOD (ROUTINE X 2)     Status: None   Collection Time    09/24/13  1:59 PM      Result Value Range Status   Specimen Description BLOOD LEFT ARM   Final   Special Requests BOTTLES DRAWN AEROBIC AND ANAEROBIC 4CC   Final   Culture  Setup Time     Final   Value: 09/24/2013 17:55     Performed at [facility]   Culture     Final   Value:        BLOOD CULTURE RECEIVED NO GROWTH TO DATE CULTURE WILL BE HELD FOR 5 DAYS BEFORE ISSUING A FINAL NEGATIVE REPORT     Performed at [facility]   Report Status PENDING   Incomplete  WOUND CULTURE     Status: None   Collection Time    09/24/13  2:05 PM      Result Value Range Status   Specimen Description LEG LOWER WOUND   Final   Special Requests Immunocompromised   Final   Gram Stain     Final   Value: ABUNDANT WBC PRESENT,BOTH PMN AND MONONUCLEAR     NO SQUAMOUS EPITHELIAL CELLS SEEN     ABUNDANT GRAM NEGATIVE RODS     ABUNDANT GRAM POSITIVE COCCI IN PAIRS     Performed at [facility]     Final   Value: Culture reincubated for better growth     Performed at [facility]   Report Status PENDING   Incomplete  WOUND CULTURE     Status: None   Collection Time    09/24/13  2:22 PM      Result Value Range Status   Specimen Description WOUND UPPER WOUND.   Final   Special Requests Immunocompromised   Final   Gram Stain     Final   Value: ABUNDANT WBC PRESENT,BOTH PMN AND MONONUCLEAR     NO SQUAMOUS EPITHELIAL CELLS SEEN     ABUNDANT GRAM NEGATIVE RODS     ABUNDANT GRAM POSITIVE COCCI IN PAIRS     FEW GRAM POSITIVE RODS   Culture     Final   Value:  ABUNDANT GRAM NEGATIVE RODS     Performed at [facility]   Report Status PENDING   Incomplete  MRSA PCR SCREENING     Status: None   Collection Time    09/24/13  7:51 PM      Result Value  Range Status   MRSA by PCR NEGATIVE  NEGATIVE Final   Comment:            The GeneXpert MRSA Assay (FDA     approved for NASAL specimens     only), is one component of a     comprehensive MRSA colonization     surveillance program. It is not     intended to diagnose MRSA     infection nor to guide or     monitor treatment for     MRSA infections.   Studies/Results: No results found. Medications: I have reviewed the patient's current medications. Scheduled Meds: . amantadine  100 mg Oral BID  . buPROPion  100 mg Oral BID  . docusate sodium  100 mg Oral BID  . feeding supplement (ENSURE COMPLETE)  237 mL Oral TID BM  . multivitamin with minerals  1 tablet Oral Daily  . sodium chloride  3 mL Intravenous Q12H  . sodium chloride  3 mL Intravenous Q12H   Continuous Infusions: . 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 The necrotic wound was debrided in [name] 12-23. She has done well overnight. - D/C IV dilaudid and start morphine IV 2 mg q 3 hr - Continue PO oxycodone IR 5 mg q 4 hr - Wound care consulted  - General surgery completed debridement in [name] 12/23, plan to appreciate rec's - Low threshold to start abx if patient appears septic (unstable vital signs) or spikes a fever  - Plan for discharge to home in the next day or so.   Polysubstance Drug Use  Patient continues to use cocaine despite obvious health negative consequences. Patient has been told that  cocaine causes her rash and resulting wounds. She continues to use cocaine despite this knowledge. However, she states that she does not fully believe that the cocaine caused it as she has used cocaine since the age of 80 and had never had this problem since recently. She believes that the rash is due to the rats that were bothering her while she was [place] a bridge. I explained to her that around 2009 levamisole was introduced as a cutting agent by the drug dealers that now contaminates 88% of cocaine in the [place]. I explained that this is the thing that causes the rash and not the rats. The patient stated that she understood and will stop using cocaine. I am uncertain if she truly believes me or is just agreeing with me.  - continue amantadine 100 mg BID for cocaine cravings   MDD c/b SI  Appears stable. No SI at this time. Continuing home meds. (bupropion and trazodone).   Homelessness While I originally planned to consult SW and case management for evaluation of placement  in ALF, the patient states that she has an apartment with a room that she can go to once she is discharged. She states that she feels safe there and will not be surrounded by drugs or drug users. She refuses ALF and SNF placement.   DVT: SCDs   Diet: Regular   Dispo: Disposition is deferred at this time, awaiting improvement of current medical problems.  Anticipated discharge in approximately 1-2 day(s).   The patient does have a current PCP (Charlann Lange, MD) and does need an [facility] hospital follow-up appointment after discharge.  The patient does have transportation limitations that hinder transportation to clinic appointments.  .Services Needed at time of discharge: Y = Yes, Blank = No PT:   OT:   RN:   Equipment:   Other:     LOS: 3 days   Marrion Coy, MD 09/27/2013, 7:14 AM

## 2013-09-28 LAB — WOUND CULTURE

## 2013-09-28 MED ORDER — OXYCODONE HCL 5 MG PO TABS
10.0000 mg | ORAL_TABLET | ORAL | Status: DC | PRN
  Administered 2013-09-28 – 2013-09-29 (×4): 10 mg via ORAL
  Filled 2013-09-28 (×5): qty 2

## 2013-09-28 NOTE — Progress Notes (Signed)
Subjective:  NAE ON. Continues to have pain as expected. Will transition to PO pain meds today.  Objective: Vital signs in last 24 hours: Filed Vitals:   09/27/13 0630 09/27/13 1256 09/27/13 2046 09/28/13 0521  BP: 144/73 154/77 156/83 147/72  Pulse: 65 69 74 85  Temp: 98.4 F (36.9 C) 97.7 F (36.5 C) 98.1 F (36.7 C) 98.4 F (36.9 C)  TempSrc: Oral  Oral Oral  Resp: 18 18 18 18   Height:  5' 2"  (1.575 m)    Weight:  101 lb 6.6 oz (46 kg)    SpO2: 100% 100% 99% 99%   Weight change:   Intake/Output Summary (Last 24 hours) at 09/28/13 0656 Last data filed at 09/28/13 0545  Gross per 24 hour  Intake 2097.5 ml  Output    650 ml  Net 1447.5 ml   Physical Exam  Constitutional:  frail  HENT:  Head: Normocephalic and atraumatic.  Mouth/Throat: Oropharynx is clear and moist.  Cardiovascular: Normal rate, regular rhythm, normal heart sounds and intact distal pulses.  Exam reveals no gallop and no friction rub.   No murmur heard. Pulmonary/Chest: Effort normal and breath sounds normal. No respiratory distress. She has no wheezes. She has no rales.  Skin:     Psychiatric:  tearful      Lab Results: Basic Metabolic Panel:  Recent Labs Lab 09/24/13 1352 09/25/13 0500  NA 138 136  K 4.0 3.6  CL 101 100  CO2 25 24  GLUCOSE 109* 95  BUN 11 13  CREATININE 0.63 0.65  CALCIUM 9.4 9.1   Liver Function Tests:  Recent Labs Lab 09/24/13 1352  AST 25  ALT 12  ALKPHOS 85  BILITOT 0.6  PROT 7.6  ALBUMIN 3.7   CBC:  Recent Labs Lab 09/24/13 1352 09/25/13 0500  WBC 3.5* 3.5*  NEUTROABS 2.2  --   HGB 13.6 11.6*  HCT 39.0 34.3*  MCV 85.5 88.4  PLT 259 218   Urine Drug Screen: Drugs of Abuse     Component Value Date/Time   LABOPIA POSITIVE* 09/24/2013 1358   LABOPIA PPS 08/31/2012 1045   COCAINSCRNUR POSITIVE* 09/24/2013 1358   COCAINSCRNUR NEG 08/31/2012 1045   LABBENZ NONE DETECTED 09/24/2013 1358   LABBENZ NEG 08/31/2012 1045   LABBENZ  NEGATIVE 06/12/2011 0221   AMPHETMU NONE DETECTED 09/24/2013 1358   AMPHETMU NEGATIVE 06/12/2011 0221   THCU POSITIVE* 09/24/2013 1358   THCU 63.4* 05/20/2012 1405   LABBARB NONE DETECTED 09/24/2013 1358   LABBARB NEG 08/31/2012 1045    Urinalysis:  Recent Labs Lab 09/24/13 1358  COLORURINE AMBER*  LABSPEC 1.030  PHURINE 6.0  GLUCOSEU NEGATIVE  HGBUR NEGATIVE  BILIRUBINUR SMALL*  KETONESUR NEGATIVE  PROTEINUR NEGATIVE  UROBILINOGEN 1.0  NITRITE NEGATIVE  LEUKOCYTESUR SMALL*    Micro Results: Recent Results (from the past 240 hour(s))  CULTURE, BLOOD (ROUTINE X 2)     Status: None   Collection Time    09/24/13  1:52 PM      Result Value Range Status   Specimen Description BLOOD RIGHT ARM   Final   Special Requests BOTTLES DRAWN AEROBIC AND ANAEROBIC 5CC   Final   Culture  Setup Time     Final   Value: 09/24/2013 17:55     Performed at [facility]   Culture     Final   Value:        BLOOD CULTURE RECEIVED NO GROWTH TO DATE CULTURE WILL BE HELD FOR  5 DAYS BEFORE ISSUING A FINAL NEGATIVE REPORT     Performed at [facility]   Report Status PENDING   Incomplete  URINE CULTURE     Status: None   Collection Time    09/24/13  1:58 PM      Result Value Range Status   Specimen Description URINE, CATHETERIZED   Final   Special Requests NONE   Final   Culture  Setup Time     Final   Value: 09/24/2013 23:11     Performed at [place]     Final   Value: NO GROWTH     Performed at [facility]   Culture     Final   Value: NO GROWTH     Performed at [facility]   Report Status 09/25/2013 FINAL   Final  CULTURE, BLOOD (ROUTINE X 2)     Status: None   Collection Time    09/24/13  1:59 PM      Result Value Range Status   Specimen Description BLOOD LEFT ARM   Final   Special Requests BOTTLES DRAWN AEROBIC AND ANAEROBIC 4CC   Final   Culture  Setup Time     Final   Value: 09/24/2013 17:55     Performed at [facility]   Culture     Final   Value:        BLOOD CULTURE RECEIVED NO GROWTH TO DATE CULTURE WILL BE HELD FOR 5 DAYS BEFORE ISSUING A FINAL NEGATIVE REPORT     Performed at [facility]   Report Status PENDING   Incomplete  WOUND CULTURE     Status: None   Collection Time    09/24/13  2:05 PM      Result Value Range Status   Specimen Description LEG LOWER WOUND   Final   Special Requests Immunocompromised   Final   Gram Stain     Final   Value: ABUNDANT WBC PRESENT,BOTH PMN AND MONONUCLEAR     NO SQUAMOUS EPITHELIAL CELLS SEEN     ABUNDANT GRAM NEGATIVE RODS     ABUNDANT GRAM POSITIVE COCCI IN PAIRS     Performed at [facility]   Culture     Final   Value: ABUNDANT GRAM NEGATIVE RODS     Performed at [facility]   Report Status PENDING   Incomplete  WOUND CULTURE     Status: None   Collection Time    09/24/13  2:22 PM      Result Value Range Status   Specimen Description WOUND UPPER WOUND.   Final   Special Requests Immunocompromised   Final   Gram Stain     Final   Value: ABUNDANT WBC PRESENT,BOTH PMN AND MONONUCLEAR     NO SQUAMOUS EPITHELIAL CELLS SEEN     ABUNDANT GRAM NEGATIVE RODS     ABUNDANT GRAM POSITIVE COCCI IN PAIRS     FEW GRAM POSITIVE RODS   Culture     Final   Value: ABUNDANT PROTEUS MIRABILIS     Performed at [facility]   Report Status 09/27/2013 FINAL   Final   Organism ID, Bacteria PROTEUS MIRABILIS   Final  MRSA PCR SCREENING     Status: None   Collection Time    09/24/13  7:51 PM      Result Value Range Status   MRSA by PCR NEGATIVE  NEGATIVE Final  Comment:            The GeneXpert MRSA Assay (FDA     approved for NASAL specimens     only), is one component of a     comprehensive MRSA colonization     surveillance program. It is not     intended to diagnose MRSA     infection nor to guide or     monitor treatment for     MRSA infections.  WOUND CULTURE     Status: None   Collection Time    09/26/13   6:02 PM      Result Value Range Status   Specimen Description WOUND LEFT LEG   Final   Special Requests NONE   Final   Gram Stain     Final   Value: FEW WBC PRESENT, PREDOMINANTLY PMN     NO SQUAMOUS EPITHELIAL CELLS SEEN     ABUNDANT GRAM NEGATIVE RODS     ABUNDANT GRAM POSITIVE COCCI     IN PAIRS   Culture     Final   Value: NO GROWTH     Performed at [facility]   Report Status PENDING   Incomplete  ANAEROBIC CULTURE     Status: None   Collection Time    09/26/13  6:02 PM      Result Value Range Status   Specimen Description WOUND LEFT LEG   Final   Special Requests NONE   Final   Gram Stain     Final   Value: RARE WBC PRESENT, PREDOMINANTLY PMN     NO SQUAMOUS EPITHELIAL CELLS SEEN     ABUNDANT GRAM NEGATIVE RODS     ABUNDANT GRAM POSITIVE COCCI     IN PAIRS   Culture     Final   Value: NO ANAEROBES ISOLATED; CULTURE IN PROGRESS FOR 5 DAYS     Performed at [facility]   Report Status PENDING   Incomplete   Studies/Results: No results found. Medications: I have reviewed the patient's current medications. Scheduled Meds: . amantadine  100 mg Oral BID  . buPROPion  100 mg Oral BID  . docusate sodium  100 mg Oral BID  . feeding supplement (ENSURE COMPLETE)  237 mL Oral TID BM  . multivitamin with minerals  1 tablet Oral Daily  . sodium chloride  3 mL Intravenous Q12H  . sodium chloride  3 mL Intravenous Q12H   Continuous Infusions: . lactated ringers 50 mL/hr (09/27/13 1205)   PRN Meds:.sodium chloride, morphine injection, ondansetron (ZOFRAN) IV, ondansetron, oxyCODONE, sodium chloride, traZODone Assessment/Plan: Principal Problem:   Vasculitic leg ulcer Active Problems:   SUBSTANCE ABUSE, MULTIPLE   Arthropathy, unspecified, site unspecified   Suicidal ideation   Ulcer   Protein-calorie malnutrition, severe  Leg Wounds Patients wounds are doing well since surgery two days ago. Reducing pain meds for discharge tomorrow. Continue moist to  dry dressings. Will Need case manager to arrange for home health for wound care.  Substance Abuse The patient is aware of that cocaine is tainted with levamisole which is causing her rashes. She states that she will not continue to use cocaine. I am uncertain as to the likelihood of this, but I am supporting her in this decision.    Dispo: Disposition is deferred at this time, awaiting improvement of current medical problems.  Anticipated discharge in approximately 1-2 day(s).   The patient does have a current PCP (Charlann Lange, MD) and does need an  [facility] hospital follow-up appointment after discharge.  The patient does have transportation limitations that hinder transportation to clinic appointments.  .Services Needed at time of discharge: Y = Yes, Blank = No PT:   OT:   RN:   Equipment:   Other:     LOS: 4 days   Marrion Coy, MD 09/28/2013, 6:56 AM

## 2013-09-29 MED ORDER — OXYCODONE HCL 5 MG PO TABS
5.0000 mg | ORAL_TABLET | ORAL | Status: DC | PRN
  Administered 2013-09-29 (×3): 5 mg via ORAL
  Filled 2013-09-29 (×4): qty 1

## 2013-09-29 NOTE — Progress Notes (Signed)
Subjective:  NAE ON. Patient requested SNF yesterday but is refusing todauy. Will decrease pain meds this am.  Objective: Vital signs in last 24 hours: Filed Vitals:   09/28/13 0521 09/28/13 1319 09/28/13 2051 09/29/13 0613  BP: 147/72 164/89 168/74 141/65  Pulse: 85 89 66 64  Temp: 98.4 F (36.9 C) 98.3 F (36.8 C) 98.5 F (36.9 C) 97.7 F (36.5 C)  TempSrc: Oral  Oral Oral  Resp: 18 19 19 20   Height:      Weight:      SpO2: 99% 99% 100% 100%   Weight change:   Intake/Output Summary (Last 24 hours) at 09/29/13 0715 Last data filed at 09/28/13 2300  Gross per 24 hour  Intake    880 ml  Output      0 ml  Net    880 ml   Physical Exam  Constitutional: She appears well-developed and well-nourished. No distress.  HENT:  Head: Normocephalic.  Mouth/Throat: Oropharynx is clear and moist. No oropharyngeal exudate.  Cardiovascular: Normal rate, regular rhythm, normal heart sounds and intact distal pulses.  Exam reveals no gallop and no friction rub.   No murmur heard. Pulmonary/Chest: Effort normal and breath sounds normal. No respiratory distress. She has no wheezes. She has no rales.  Skin: She is not diaphoretic.  Dressings in place. No purulent drainage.  Psychiatric: She has a normal mood and affect. Her behavior is normal.    Lab Results: Basic Metabolic Panel:  Recent Labs Lab 09/24/13 1352 09/25/13 0500  NA 138 136  K 4.0 3.6  CL 101 100  CO2 25 24  GLUCOSE 109* 95  BUN 11 13  CREATININE 0.63 0.65  CALCIUM 9.4 9.1   Liver Function Tests:  Recent Labs Lab 09/24/13 1352  AST 25  ALT 12  ALKPHOS 85  BILITOT 0.6  PROT 7.6  ALBUMIN 3.7   No results found for this basename: LIPASE, AMYLASE,  in the last 168 hours No results found for this basename: AMMONIA,  in the last 168 hours CBC:  Recent Labs Lab 09/24/13 1352 09/25/13 0500  WBC 3.5* 3.5*  NEUTROABS 2.2  --   HGB 13.6 11.6*  HCT 39.0 34.3*  MCV 85.5 88.4  PLT 259 218   Cardiac  Enzymes: No results found for this basename: CKTOTAL, CKMB, CKMBINDEX, TROPONINI,  in the last 168 hours BNP: No results found for this basename: PROBNP,  in the last 168 hours D-Dimer: No results found for this basename: DDIMER,  in the last 168 hours CBG: No results found for this basename: GLUCAP,  in the last 168 hours Hemoglobin A1C: No results found for this basename: HGBA1C,  in the last 168 hours Fasting Lipid Panel: No results found for this basename: CHOL, HDL, LDLCALC, TRIG, CHOLHDL, LDLDIRECT,  in the last 168 hours Thyroid Function Tests: No results found for this basename: TSH, T4TOTAL, FREET4, T3FREE, THYROIDAB,  in the last 168 hours Coagulation: No results found for this basename: LABPROT, INR,  in the last 168 hours Anemia Panel: No results found for this basename: VITAMINB12, FOLATE, FERRITIN, TIBC, IRON, RETICCTPCT,  in the last 168 hours Urine Drug Screen: Drugs of Abuse     Component Value Date/Time   LABOPIA POSITIVE* 09/24/2013 1358   LABOPIA PPS 08/31/2012 1045   COCAINSCRNUR POSITIVE* 09/24/2013 1358   COCAINSCRNUR NEG 08/31/2012 1045   LABBENZ NONE DETECTED 09/24/2013 1358   LABBENZ NEG 08/31/2012 [place] 06/12/2011 0221   AMPHETMU  NONE DETECTED 09/24/2013 1358   AMPHETMU NEGATIVE 06/12/2011 0221   THCU POSITIVE* 09/24/2013 1358   THCU 63.4* 05/20/2012 1405   LABBARB NONE DETECTED 09/24/2013 1358   LABBARB NEG 08/31/2012 1045    Alcohol Level: No results found for this basename: ETH,  in the last 168 hours Urinalysis:  Recent Labs Lab 09/24/13 [place] 1.030  PHURINE 6.0  [place]  UROBILINOGEN 1.0  NITRITE NEGATIVE  LEUKOCYTESUR SMALL*    Micro Results: Recent Results (from the past 240 hour(s))  CULTURE, BLOOD (ROUTINE X 2)     Status: None   Collection Time    09/24/13  1:52 PM      Result Value Range Status     Specimen Description BLOOD RIGHT ARM   Final   Special Requests BOTTLES DRAWN AEROBIC AND ANAEROBIC 5CC   Final   Culture  Setup Time     Final   Value: 09/24/2013 17:55     Performed at [facility]   Culture     Final   Value:        BLOOD CULTURE RECEIVED NO GROWTH TO DATE CULTURE WILL BE HELD FOR 5 DAYS BEFORE ISSUING A FINAL NEGATIVE REPORT     Performed at [facility]   Report Status PENDING   Incomplete  URINE CULTURE     Status: None   Collection Time    09/24/13  1:58 PM      Result Value Range Status   Specimen Description URINE, CATHETERIZED   Final   Special Requests NONE   Final   Culture  Setup Time     Final   Value: 09/24/2013 23:11     Performed at [place]     Final   Value: NO GROWTH     Performed at [facility]   Culture     Final   Value: NO GROWTH     Performed at [facility]   Report Status 09/25/2013 FINAL   Final  CULTURE, BLOOD (ROUTINE X 2)     Status: None   Collection Time    09/24/13  1:59 PM      Result Value Range Status   Specimen Description BLOOD LEFT ARM   Final   Special Requests BOTTLES DRAWN AEROBIC AND ANAEROBIC 4CC   Final   Culture  Setup Time     Final   Value: 09/24/2013 17:55     Performed at [facility]   Culture     Final   Value:        BLOOD CULTURE RECEIVED NO GROWTH TO DATE CULTURE WILL BE HELD FOR 5 DAYS BEFORE ISSUING A FINAL NEGATIVE REPORT     Performed at [facility]   Report Status PENDING   Incomplete  WOUND CULTURE     Status: None   Collection Time    09/24/13  2:05 PM      Result Value Range Status   Specimen Description LEG LOWER WOUND   Final   Special Requests Immunocompromised   Final   Gram Stain     Final   Value: ABUNDANT WBC PRESENT,BOTH PMN AND MONONUCLEAR     NO SQUAMOUS EPITHELIAL CELLS SEEN     ABUNDANT GRAM NEGATIVE RODS     ABUNDANT GRAM POSITIVE COCCI IN PAIRS     Performed at  [facility]      Final   Value: ABUNDANT PROTEUS MIRABILIS     Performed at [facility]   Report Status 09/28/2013 FINAL   Final   Organism ID, Bacteria PROTEUS MIRABILIS   Final  WOUND CULTURE     Status: None   Collection Time    09/24/13  2:22 PM      Result Value Range Status   Specimen Description WOUND UPPER WOUND.   Final   Special Requests Immunocompromised   Final   Gram Stain     Final   Value: ABUNDANT WBC PRESENT,BOTH PMN AND MONONUCLEAR     NO SQUAMOUS EPITHELIAL CELLS SEEN     ABUNDANT GRAM NEGATIVE RODS     ABUNDANT GRAM POSITIVE COCCI IN PAIRS     FEW GRAM POSITIVE RODS   Culture     Final   Value: ABUNDANT PROTEUS MIRABILIS     Performed at [facility]   Report Status 09/27/2013 FINAL   Final   Organism ID, Bacteria PROTEUS MIRABILIS   Final  MRSA PCR SCREENING     Status: None   Collection Time    09/24/13  7:51 PM      Result Value Range Status   MRSA by PCR NEGATIVE  NEGATIVE Final   Comment:            The GeneXpert MRSA Assay (FDA     approved for NASAL specimens     only), is one component of a     comprehensive MRSA colonization     surveillance program. It is not     intended to diagnose MRSA     infection nor to guide or     monitor treatment for     MRSA infections.  WOUND CULTURE     Status: None   Collection Time    09/26/13  6:02 PM      Result Value Range Status   Specimen Description WOUND LEFT LEG   Final   Special Requests NONE   Final   Gram Stain     Final   Value: FEW WBC PRESENT, PREDOMINANTLY PMN     NO SQUAMOUS EPITHELIAL CELLS SEEN     ABUNDANT GRAM NEGATIVE RODS     ABUNDANT GRAM POSITIVE COCCI     IN PAIRS   Culture     Final   Value: MODERATE GRAM NEGATIVE RODS     Performed at [facility]   Report Status PENDING   Incomplete  ANAEROBIC CULTURE     Status: None   Collection Time    09/26/13  6:02 PM      Result Value Range Status   Specimen Description WOUND LEFT LEG   Final   Special Requests NONE   Final    Gram Stain     Final   Value: RARE WBC PRESENT, PREDOMINANTLY PMN     NO SQUAMOUS EPITHELIAL CELLS SEEN     ABUNDANT GRAM NEGATIVE RODS     ABUNDANT GRAM POSITIVE COCCI     IN PAIRS   Culture     Final   Value: NO ANAEROBES ISOLATED; CULTURE IN PROGRESS FOR 5 DAYS     Performed at [facility]   Report Status PENDING   Incomplete   Studies/Results: No results found. Medications: I have reviewed the patient's current medications. Scheduled Meds: . amantadine  100 mg Oral BID  . buPROPion  100 mg Oral  BID  . docusate sodium  100 mg Oral BID  . feeding supplement (ENSURE COMPLETE)  237 mL Oral TID BM  . multivitamin with minerals  1 tablet Oral Daily  . sodium chloride  3 mL Intravenous Q12H  . sodium chloride  3 mL Intravenous Q12H   Continuous Infusions: . lactated ringers 50 mL/hr (09/27/13 1205)   PRN Meds:.sodium chloride, ondansetron (ZOFRAN) IV, ondansetron, oxyCODONE, sodium chloride, traZODone Assessment/Plan: Principal Problem:   Vasculitic leg ulcer Active Problems:   SUBSTANCE ABUSE, MULTIPLE   Arthropathy, unspecified, site unspecified   Suicidal ideation   Ulcer   Protein-calorie malnutrition, severe  Leg Wounds Appear to be improving nicely. No signs of infection. Anticipate discharge to home today or tomorrow depending on availability of wound care at home. Will continue to deescalate pain meds today. Now currently on oxycodone 5 mg IR q 3 prn.  Dispo: Disposition is deferred at this time, awaiting improvement of current medical problems.  Anticipated discharge in approximately today or tomorrow day(s).   The patient does have a current PCP (Charlann Lange, MD) and does not need an [facility] hospital follow-up appointment after discharge.  The patient does have transportation limitations that hinder transportation to clinic appointments.  .Services Needed at time of discharge: Y = Yes, Blank = No PT:   OT:   RN:   Equipment:   Other:     LOS: 5 days    Marrion Coy, MD 09/29/2013, 7:15 AM

## 2013-09-29 NOTE — Care Management Note (Signed)
CARE MANAGEMENT NOTE 09/29/2013  Patient:  [name], [name]   Account Number:  [id number]  Date Initiated:  09/27/2013  Documentation initiated by:  Ricki Miller  Subjective/Objective Assessment:   52 yr old female admitted with multiple wounds. S/p I & D     Action/Plan:   Patient states she has a friend that she can stay with but feels she needs to go to [facility] care.  SW following also.  CM arranged HHRN with [facility].   Anticipated DC Date:  09/30/2013   Anticipated DC Plan:  ASSISTED LIVING / REST HOME  In-house referral  Clinical Social Worker      DC Forensic scientist  CM consult      [facility] Choice  HOME HEALTH   Choice offered to / List presented to:          [facility] arranged  HH-1 RN      [place].   Status of service:  Completed, signed off Discharge Disposition:  ASSISTED LIVING  Per UR Regulation:    If discussed at Long Length of Stay Meetings, dates discussed:    Comments:  09/29/13 11:27am Ricki Miller, RN BSN

## 2013-09-30 LAB — WOUND CULTURE

## 2013-09-30 LAB — CULTURE, BLOOD (ROUTINE X 2)

## 2013-09-30 MED ORDER — IBUPROFEN 600 MG PO TABS
600.0000 mg | ORAL_TABLET | Freq: Four times a day (QID) | ORAL | Status: DC | PRN
  Administered 2013-09-30 – 2013-10-02 (×9): 600 mg via ORAL
  Filled 2013-09-30 (×9): qty 1

## 2013-09-30 NOTE — Progress Notes (Signed)
Internal Medicine Attending  Date: 09/30/2013  Patient name: [name] Medical record number: [id number] Date of birth: 01-10-1961 Age: 52 y.o. Gender: female  I saw and evaluated the patient and discussed her care with house staff. I reviewed the resident's note by Dr. Margart Sickles and I agree with the resident's findings and plans as documented in his note.  Dr. Lynnae January will take over as attending physician tomorrow 10/01/2013.

## 2013-09-30 NOTE — Progress Notes (Signed)
Subjective:  [name]. Patients wound continues to heal nicely. No drainage. Dressings in place. Patient has learned how to complete dressing changes.  Objective: Vital signs in last 24 hours: Filed Vitals:   09/29/13 0613 09/29/13 1359 09/29/13 2123 09/30/13 0638  BP: 141/65 131/61 129/82 130/87  Pulse: 64 66 71 75  Temp: 97.7 F (36.5 C) 98.6 F (37 C) 98.2 F (36.8 C) 98.7 F (37.1 C)  TempSrc: Oral Oral    Resp: 20 18 18 18   Height:      Weight:      SpO2: 100% 100% 100% 100%   Weight change:   Intake/Output Summary (Last 24 hours) at 09/30/13 0706 Last data filed at 09/30/13 0650  Gross per 24 hour  Intake   1080 ml  Output      0 ml  Net   1080 ml   Physical Exam  Constitutional: She appears well-developed and well-nourished. No distress.  HENT:  Head: Normocephalic.  Mouth/Throat: Oropharynx is clear and moist. No oropharyngeal exudate.  Cardiovascular: Normal rate, regular rhythm, normal heart sounds and intact distal pulses. Exam reveals no gallop and no friction rub.  No murmur heard.  Pulmonary/Chest: Effort normal and breath sounds normal. No respiratory distress. She has no wheezes. She has no rales.  Skin: She is not diaphoretic.  Dressings in place. No purulent drainage.  Psychiatric: She has a normal mood and affect. Her behavior is normal.    Lab Results: Basic Metabolic Panel:  Recent Labs Lab 09/24/13 1352 09/25/13 0500  NA 138 136  K 4.0 3.6  CL 101 100  CO2 25 24  GLUCOSE 109* 95  BUN 11 13  CREATININE 0.63 0.65  CALCIUM 9.4 9.1   Liver Function Tests:  Recent Labs Lab 09/24/13 1352  AST 25  ALT 12  ALKPHOS 85  BILITOT 0.6  PROT 7.6  ALBUMIN 3.7   No results found for this basename: LIPASE, AMYLASE,  in the last 168 hours No results found for this basename: AMMONIA,  in the last 168 hours CBC:  Recent Labs Lab 09/24/13 1352 09/25/13 0500  WBC 3.5* 3.5*  NEUTROABS 2.2  --   HGB 13.6 11.6*  HCT 39.0 34.3*  MCV  85.5 88.4  PLT 259 218   Urine Drug Screen: Drugs of Abuse     Component Value Date/Time   LABOPIA POSITIVE* 09/24/2013 1358   LABOPIA PPS 08/31/2012 1045   COCAINSCRNUR POSITIVE* 09/24/2013 1358   COCAINSCRNUR NEG 08/31/2012 1045   LABBENZ NONE DETECTED 09/24/2013 1358   LABBENZ NEG 08/31/2012 1045   LABBENZ NEGATIVE 06/12/2011 0221   AMPHETMU NONE DETECTED 09/24/2013 1358   AMPHETMU NEGATIVE 06/12/2011 0221   THCU POSITIVE* 09/24/2013 1358   THCU 63.4* 05/20/2012 1405   LABBARB NONE DETECTED 09/24/2013 1358   LABBARB NEG 08/31/2012 1045    Urinalysis:  Recent Labs Lab 09/24/13 1358  COLORURINE AMBER*  LABSPEC 1.030  PHURINE 6.0  GLUCOSEU NEGATIVE  HGBUR NEGATIVE  BILIRUBINUR SMALL*  KETONESUR NEGATIVE  PROTEINUR NEGATIVE  UROBILINOGEN 1.0  NITRITE NEGATIVE  LEUKOCYTESUR SMALL*    Micro Results: Recent Results (from the past 240 hour(s))  CULTURE, BLOOD (ROUTINE X 2)     Status: None   Collection Time    09/24/13  1:52 PM      Result Value Range Status   Specimen Description BLOOD RIGHT ARM   Final   Special Requests BOTTLES DRAWN AEROBIC AND ANAEROBIC 5CC   Final   Culture  Setup Time     Final   Value: 09/24/2013 17:55     Performed at [facility]   Culture     Final   Value:        BLOOD CULTURE RECEIVED NO GROWTH TO DATE CULTURE WILL BE HELD FOR 5 DAYS BEFORE ISSUING A FINAL NEGATIVE REPORT     Performed at [facility]   Report Status PENDING   Incomplete  URINE CULTURE     Status: None   Collection Time    09/24/13  1:58 PM      Result Value Range Status   Specimen Description URINE, CATHETERIZED   Final   Special Requests NONE   Final   Culture  Setup Time     Final   Value: 09/24/2013 23:11     Performed at [place]     Final   Value: NO GROWTH     Performed at [facility]   Culture     Final   Value: NO GROWTH     Performed at [facility]   Report Status 09/25/2013 FINAL   Final    CULTURE, BLOOD (ROUTINE X 2)     Status: None   Collection Time    09/24/13  1:59 PM      Result Value Range Status   Specimen Description BLOOD LEFT ARM   Final   Special Requests BOTTLES DRAWN AEROBIC AND ANAEROBIC 4CC   Final   Culture  Setup Time     Final   Value: 09/24/2013 17:55     Performed at [facility]   Culture     Final   Value:        BLOOD CULTURE RECEIVED NO GROWTH TO DATE CULTURE WILL BE HELD FOR 5 DAYS BEFORE ISSUING A FINAL NEGATIVE REPORT     Performed at [facility]   Report Status PENDING   Incomplete  WOUND CULTURE     Status: None   Collection Time    09/24/13  2:05 PM      Result Value Range Status   Specimen Description LEG LOWER WOUND   Final   Special Requests Immunocompromised   Final   Gram Stain     Final   Value: ABUNDANT WBC PRESENT,BOTH PMN AND MONONUCLEAR     NO SQUAMOUS EPITHELIAL CELLS SEEN     ABUNDANT GRAM NEGATIVE RODS     ABUNDANT GRAM POSITIVE COCCI IN PAIRS     Performed at [facility]   Culture     Final   Value: ABUNDANT PROTEUS MIRABILIS     Performed at [facility]   Report Status 09/28/2013 FINAL   Final   Organism ID, Bacteria PROTEUS MIRABILIS   Final  WOUND CULTURE     Status: None   Collection Time    09/24/13  2:22 PM      Result Value Range Status   Specimen Description WOUND UPPER WOUND.   Final   Special Requests Immunocompromised   Final   Gram Stain     Final   Value: ABUNDANT WBC PRESENT,BOTH PMN AND MONONUCLEAR     NO SQUAMOUS EPITHELIAL CELLS SEEN     ABUNDANT GRAM NEGATIVE RODS     ABUNDANT GRAM POSITIVE COCCI IN PAIRS     FEW GRAM POSITIVE RODS   Culture     Final   Value: ABUNDANT PROTEUS MIRABILIS     Performed  at [facility]   Report Status 09/27/2013 FINAL   Final   Organism ID, Bacteria PROTEUS MIRABILIS   Final  MRSA PCR SCREENING     Status: None   Collection Time    09/24/13  7:51 PM      Result Value Range Status   MRSA by PCR NEGATIVE  NEGATIVE  Final   Comment:            The GeneXpert MRSA Assay (FDA     approved for NASAL specimens     only), is one component of a     comprehensive MRSA colonization     surveillance program. It is not     intended to diagnose MRSA     infection nor to guide or     monitor treatment for     MRSA infections.  WOUND CULTURE     Status: None   Collection Time    09/26/13  6:02 PM      Result Value Range Status   Specimen Description WOUND LEFT LEG   Final   Special Requests NONE   Final   Gram Stain     Final   Value: FEW WBC PRESENT, PREDOMINANTLY PMN     NO SQUAMOUS EPITHELIAL CELLS SEEN     ABUNDANT GRAM NEGATIVE RODS     ABUNDANT GRAM POSITIVE COCCI     IN PAIRS   Culture     Final   Value: MODERATE PROTEUS MIRABILIS     Performed at [facility]   Report Status PENDING   Incomplete   Organism ID, Bacteria PROTEUS MIRABILIS   Final  ANAEROBIC CULTURE     Status: None   Collection Time    09/26/13  6:02 PM      Result Value Range Status   Specimen Description WOUND LEFT LEG   Final   Special Requests NONE   Final   Gram Stain     Final   Value: RARE WBC PRESENT, PREDOMINANTLY PMN     NO SQUAMOUS EPITHELIAL CELLS SEEN     ABUNDANT GRAM NEGATIVE RODS     ABUNDANT GRAM POSITIVE COCCI     IN PAIRS   Culture     Final   Value: NO ANAEROBES ISOLATED; CULTURE IN PROGRESS FOR 5 DAYS     Performed at [facility]   Report Status PENDING   Incomplete   Studies/Results: No results found. Medications: I have reviewed the patient's current medications. Scheduled Meds: . amantadine  100 mg Oral BID  . buPROPion  100 mg Oral BID  . docusate sodium  100 mg Oral BID  . feeding supplement (ENSURE COMPLETE)  237 mL Oral TID BM  . multivitamin with minerals  1 tablet Oral Daily  . sodium chloride  3 mL Intravenous Q12H  . sodium chloride  3 mL Intravenous Q12H   Continuous Infusions: . 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 She continues to have pain. However, due to her polysubstance drug use she will be unable to be discharged [name] pain medication. I have slowly deescalated her pain medicines over the last three  days. I will be discontinuing pain meds today. - Narcotic pain medications discontinued today. Ibuprofen 600 mg q 6 prn started.  - Patient needs wound care as outpatient or at ALF - No abx at this time since no signs of infection.  Polysubstance Drug Use The patient continues to use cocaine despite knowing that it causes her leg wounds. She will not be discharged with any narcotic pain medication.  - Patient is [name] amantadine 100 mg  BID for cocaine cravings.  MDD Stable. No SI/HI. Continue home meds  Disposition Patient continues to change mind multiple times a day [name] whether she is willing to go to an ALF. She adamantly told me yesterday that she would not go to ALF. Later in the afternoon she told case management that she wants placement in ALF. I am concerned that she may be changing her mind in order to lengthen her stay in the hospital. Plan for discharge Monday to ALF.   The patient does have a current PCP (Charlann Lange, MD) and does need an [facility] hospital follow-up appointment after discharge.  The patient does not have transportation limitations that hinder transportation to clinic appointments.  .Services Needed at time of discharge: Y = Yes, Blank = No PT:   OT:   RN:   Equipment:   Other:     LOS: 6 days   Marrion Coy, MD 09/30/2013, 7:06 AM

## 2013-09-30 NOTE — Progress Notes (Signed)
   CARE MANAGEMENT NOTE 09/30/2013  Patient:  [name], [name]   Account Number:  [id number]  Date Initiated:  09/27/2013  Documentation initiated by:  Ricki Miller  Subjective/Objective Assessment:   52 yr old female admitted with multiple wounds. S/p I & D     Action/Plan:   Patient states she has a friend that she can stay with but feels she needs to go to [facility] care.  SW following also.  CM arranged HHRN with [facility].   Anticipated DC Date:  09/30/2013   Anticipated DC Plan:  ASSISTED LIVING / REST HOME  In-house referral  Clinical Social Worker      DC Forensic scientist  CM consult      [facility] Choice  HOME HEALTH   Choice offered to / List presented to:          [facility] arranged  HH-1 RN      [place].   Status of service:  In process, will continue to follow Medicare Important Message given?   (If response is "NO", the following Medicare IM given date fields will be blank) Date Medicare IM given:   Date Additional Medicare IM given:    Discharge Disposition:  ASSISTED LIVING  Per UR Regulation:    If discussed at Long Length of Stay Meetings, dates discussed:    Comments:  09/2713 09:30 CM spoke with pt in Risk who states she has an address of friend she will be staying after discharge: [address] [place], [place] [address].  Pt states she will be going to this address upon discharge.  CM waiting on [place] orders.  Will continue to follow for discharge disposition.  Mariane Masters, BSN, [place].  09/29/13 11:27am Ricki Miller, RN BSN

## 2013-10-01 DIAGNOSIS — F1420 Cocaine dependence, uncomplicated: Secondary | ICD-10-CM

## 2013-10-01 LAB — ANAEROBIC CULTURE

## 2013-10-01 MED ORDER — HEPARIN SODIUM (PORCINE) 5000 UNIT/ML IJ SOLN
5000.0000 [IU] | Freq: Three times a day (TID) | INTRAMUSCULAR | Status: DC
  Administered 2013-10-01 – 2013-10-02 (×4): 5000 [IU] via SUBCUTANEOUS
  Filled 2013-10-01 (×6): qty 1

## 2013-10-01 NOTE — Progress Notes (Addendum)
Subjective:  Pt seen and examined in AM. No acute events overnight. Pt reports her bilateral thigh pain is improving. Her wounds are also improving and there is less drainage. She denies fever, chills, dyspnea, chest pain, nausea, vomiting, abdominal pain, change in BM or urination. She is eager to go to ALF tomorrow.   Objective: Vital signs in last 24 hours: Filed Vitals:   09/30/13 1400 09/30/13 2157 10/01/13 0521 10/01/13 1317  BP: 109/59 105/55 125/64 114/63  Pulse: 70 69 55 66  Temp: 97.8 F (36.6 C) 97.5 F (36.4 C) 97.7 F (36.5 C) 98 F (36.7 C)  TempSrc: Oral Oral Oral Oral  Resp: 20 18 18 18   Height:      Weight:      SpO2: 100% 99% 100% 100%   Weight change:   Intake/Output Summary (Last 24 hours) at 10/01/13 1556 Last data filed at 09/30/13 2300  Gross per 24 hour  Intake    400 ml  Output      0 ml  Net    400 ml     Physical Exam  Constitutional: She appears well-developed and well-nourished. No distress.  HENT:  Head: Normocephalic.  Mouth/Throat: Oropharynx is clear and moist. No oropharyngeal exudate.  Cardiovascular: Normal rate, regular rhythm, normal heart sounds and intact distal pulses. Exam reveals no gallop and no friction rub.  No murmur heard.  Pulmonary/Chest: Effort normal and breath sounds normal. No respiratory distress. She has no wheezes. She has no rales.  Skin: She is not diaphoretic.  Right thigh with two  healing wounds with minimal serous drainage without overlying erythema, swelling, or foul odor. Left thigh with two larger wounds with minimal purulent disharge without overlying erythema, swelling, or foul odor.  Psychiatric: She has a normal mood and affect. Her behavior is normal.   Lab Results: Basic Metabolic Panel:  Recent Labs Lab 09/25/13 0500  NA 136  K 3.6  CL 100  CO2 24  GLUCOSE 95  BUN 13  CREATININE 0.65  CALCIUM 9.1   Liver Function Tests: No results found for this basename: AST, ALT, ALKPHOS,  BILITOT, PROT, ALBUMIN,  in the last 168 hours No results found for this basename: LIPASE, AMYLASE,  in the last 168 hours No results found for this basename: AMMONIA,  in the last 168 hours CBC:  Recent Labs Lab 09/25/13 0500  WBC 3.5*  HGB 11.6*  HCT 34.3*  MCV 88.4  PLT 218   Cardiac Enzymes: No results found for this basename: CKTOTAL, CKMB, CKMBINDEX, TROPONINI,  in the last 168 hours BNP: No results found for this basename: PROBNP,  in the last 168 hours D-Dimer: No results found for this basename: DDIMER,  in the last 168 hours CBG: No results found for this basename: GLUCAP,  in the last 168 hours Hemoglobin A1C: No results found for this basename: HGBA1C,  in the last 168 hours Fasting Lipid Panel: No results found for this basename: CHOL, HDL, LDLCALC, TRIG, CHOLHDL, LDLDIRECT,  in the last 168 hours Thyroid Function Tests: No results found for this basename: TSH, T4TOTAL, FREET4, T3FREE, THYROIDAB,  in the last 168 hours Coagulation: No results found for this basename: LABPROT, INR,  in the last 168 hours Anemia Panel: No results found for this basename: VITAMINB12, FOLATE, FERRITIN, TIBC, IRON, RETICCTPCT,  in the last 168 hours Urine Drug Screen: Drugs of Abuse     Component Value Date/Time   LABOPIA POSITIVE* 09/24/2013 1358   LABOPIA PPS 08/31/2012  [place] 09/24/2013 1358   COCAINSCRNUR NEG 08/31/2012 1045   LABBENZ NONE DETECTED 09/24/2013 1358   LABBENZ NEG 08/31/2012 1045   LABBENZ NEGATIVE 06/12/2011 0221   AMPHETMU NONE DETECTED 09/24/2013 1358   AMPHETMU NEGATIVE 06/12/2011 0221   THCU POSITIVE* 09/24/2013 1358   THCU 63.4* 05/20/2012 1405   LABBARB NONE DETECTED 09/24/2013 1358   LABBARB NEG 08/31/2012 1045    Alcohol Level: No results found for this basename: ETH,  in the last 168 hours Urinalysis: No results found for this basename: COLORURINE, APPERANCEUR, LABSPEC, [place], GLUCOSEU, HGBUR, BILIRUBINUR, [place],  PROTEINUR, UROBILINOGEN, NITRITE, LEUKOCYTESUR,  in the last 168 hours  Micro Results: Recent Results (from the past 240 hour(s))  CULTURE, BLOOD (ROUTINE X 2)     Status: None   Collection Time    09/24/13  1:52 PM      Result Value Range Status   Specimen Description BLOOD RIGHT ARM   Final   Special Requests BOTTLES DRAWN AEROBIC AND ANAEROBIC 5CC   Final   Culture  Setup Time     Final   Value: 09/24/2013 17:55     Performed at [place]     Final   Value: NO GROWTH 5 DAYS     Performed at [facility]   Report Status 09/30/2013 FINAL   Final  URINE CULTURE     Status: None   Collection Time    09/24/13  1:58 PM      Result Value Range Status   Specimen Description URINE, CATHETERIZED   Final   Special Requests NONE   Final   Culture  Setup Time     Final   Value: 09/24/2013 23:11     Performed at [place]     Final   Value: NO GROWTH     Performed at [facility]   Culture     Final   Value: NO GROWTH     Performed at [facility]   Report Status 09/25/2013 FINAL   Final  CULTURE, BLOOD (ROUTINE X 2)     Status: None   Collection Time    09/24/13  1:59 PM      Result Value Range Status   Specimen Description BLOOD LEFT ARM   Final   Special Requests BOTTLES DRAWN AEROBIC AND ANAEROBIC 4CC   Final   Culture  Setup Time     Final   Value: 09/24/2013 17:55     Performed at [facility]   Culture     Final   Value: NO GROWTH 5 DAYS     Performed at [facility]   Report Status 09/30/2013 FINAL   Final  WOUND CULTURE     Status: None   Collection Time    09/24/13  2:05 PM      Result Value Range Status   Specimen Description LEG LOWER WOUND   Final   Special Requests Immunocompromised   Final   Gram Stain     Final   Value: ABUNDANT WBC PRESENT,BOTH PMN AND MONONUCLEAR     NO SQUAMOUS EPITHELIAL CELLS SEEN     ABUNDANT GRAM NEGATIVE RODS     ABUNDANT GRAM POSITIVE COCCI IN  PAIRS     Performed at [facility]   Culture     Final   Value: ABUNDANT PROTEUS MIRABILIS     Performed at [facility]  Report Status 09/28/2013 FINAL   Final   Organism ID, Bacteria PROTEUS MIRABILIS   Final  WOUND CULTURE     Status: None   Collection Time    09/24/13  2:22 PM      Result Value Range Status   Specimen Description WOUND UPPER WOUND.   Final   Special Requests Immunocompromised   Final   Gram Stain     Final   Value: ABUNDANT WBC PRESENT,BOTH PMN AND MONONUCLEAR     NO SQUAMOUS EPITHELIAL CELLS SEEN     ABUNDANT GRAM NEGATIVE RODS     ABUNDANT GRAM POSITIVE COCCI IN PAIRS     FEW GRAM POSITIVE RODS   Culture     Final   Value: ABUNDANT PROTEUS MIRABILIS     Performed at [facility]   Report Status 09/27/2013 FINAL   Final   Organism ID, Bacteria PROTEUS MIRABILIS   Final  MRSA PCR SCREENING     Status: None   Collection Time    09/24/13  7:51 PM      Result Value Range Status   MRSA by PCR NEGATIVE  NEGATIVE Final   Comment:            The GeneXpert MRSA Assay (FDA     approved for NASAL specimens     only), is one component of a     comprehensive MRSA colonization     surveillance program. It is not     intended to diagnose MRSA     infection nor to guide or     monitor treatment for     MRSA infections.  WOUND CULTURE     Status: None   Collection Time    09/26/13  6:02 PM      Result Value Range Status   Specimen Description WOUND LEFT LEG   Final   Special Requests NONE   Final   Gram Stain     Final   Value: FEW WBC PRESENT, PREDOMINANTLY PMN     NO SQUAMOUS EPITHELIAL CELLS SEEN     ABUNDANT GRAM NEGATIVE RODS     ABUNDANT GRAM POSITIVE COCCI     IN PAIRS   Culture     Final   Value: MODERATE PROTEUS MIRABILIS     MODERATE ENTEROCOCCUS SPECIES     Performed at [facility]   Report Status 09/30/2013 FINAL   Final   Organism ID, Bacteria PROTEUS MIRABILIS   Final   Organism ID, Bacteria ENTEROCOCCUS  SPECIES   Final  ANAEROBIC CULTURE     Status: None   Collection Time    09/26/13  6:02 PM      Result Value Range Status   Specimen Description WOUND LEFT LEG   Final   Special Requests NONE   Final   Gram Stain     Final   Value: RARE WBC PRESENT, PREDOMINANTLY PMN     NO SQUAMOUS EPITHELIAL CELLS SEEN     ABUNDANT GRAM NEGATIVE RODS     ABUNDANT GRAM POSITIVE COCCI     IN PAIRS   Culture     Final   Value: NO ANAEROBES ISOLATED     Performed at [facility]   Report Status 10/01/2013 FINAL   Final   Studies/Results: No results found. Medications: I have reviewed the patient's current medications. Scheduled Meds: . amantadine  100 mg Oral BID  . buPROPion  100 mg Oral BID  . docusate sodium  100  mg Oral BID  . feeding supplement (ENSURE COMPLETE)  237 mL Oral TID BM  . multivitamin with minerals  1 tablet Oral Daily  . sodium chloride  3 mL Intravenous Q12H  . 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 and past history of similar episodes   -Continue wound care, pt to be placed at assisted living facility tomm  -Continue ibuprofen 600 mg Q6hr PRN pain -Left thigh wound cultures from 12/23 (resulted 12/27) with pansensitive Proteus and Enterococcus --->since appears to be adequately healing, will continue to observe for signs of infection and administer antibiotics if warranted -Pt encouraged to abstain from cocaine use  Polysubstance Abuse -  Pt with UDS + for cocaine, amphetamines, and THC -Pt encouraged to abstain  from drug use  -Continue amantadine 100 mg BID for cocaine cravings  Chronic mild leukopenia  - stable. Most likely due to levamisole as it is known to suppress white blood cells. Pt found to be HIV negative. -Continue to monitor   Major Depressive Disorder - currently stable mood without SI -Continue bupropion 100 mg BID   -Continue trazodone 50 mg PRN sleep     DVT PPx: Start Heparin SQ TID  Diet: Regular   Dispo: Disposition is deferred at this time, awaiting improvement of current medical problems.  Anticipated discharge in approximately 1 day(s).   The patient does have a current PCP (Charlann Lange, MD) and does need an [facility] hospital follow-up appointment after discharge.  The patient does not have transportation limitations that hinder transportation to clinic appointments.  .Services Needed at time of discharge: Y = Yes, Blank = No PT:   OT:   RN:   Equipment:   Other:     LOS: 7 days   Juluis Mire, MD 10/01/2013, 3:56 PM

## 2013-10-01 NOTE — Progress Notes (Signed)
  Date: 10/01/2013  Patient name: [name]  Medical record number: [id number]  Date of birth: 10/14/1960   This patient has been seen and the plan of care was discussed with the house staff. Please see their note for complete details. I concur with their findings with the following additions/corrections: Pharm alerted [place] to the L wound cx results. Dr Ronne Binning and I examined the wounds. On the R leg, there is no erythema, serous discharge, no purulent d/c, no pus, no odor. On the L leg, there are two larger wounds : no erythema, serous discharge, min purulent material on the base, no pus, no odor. These wounds may be watcher (will be getting wound care at the AL) and hold ABX for now as no fever, no increased WBC.   Bartholomew Crews, MD 10/01/2013, 12:56 PM

## 2013-10-02 DIAGNOSIS — L97909 Non-pressure chronic ulcer of unspecified part of unspecified lower leg with unspecified severity: Secondary | ICD-10-CM

## 2013-10-02 DIAGNOSIS — F3289 Other specified depressive episodes: Secondary | ICD-10-CM

## 2013-10-02 DIAGNOSIS — F329 Major depressive disorder, single episode, unspecified: Secondary | ICD-10-CM

## 2013-10-02 DIAGNOSIS — I10 Essential (primary) hypertension: Secondary | ICD-10-CM

## 2013-10-02 DIAGNOSIS — F17200 Nicotine dependence, unspecified, uncomplicated: Secondary | ICD-10-CM

## 2013-10-02 DIAGNOSIS — E43 Unspecified severe protein-calorie malnutrition: Secondary | ICD-10-CM

## 2013-10-02 LAB — CBC WITH DIFFERENTIAL/PLATELET
Basophils Relative: 0 % (ref 0–1)
Eosinophils Relative: 2 % (ref 0–5)
Hemoglobin: 10.7 g/dL — ABNORMAL LOW (ref 12.0–15.0)
Lymphocytes Relative: 31 % (ref 12–46)
MCHC: 35.1 g/dL (ref 30.0–36.0)
Monocytes Relative: 4 % (ref 3–12)
Neutro Abs: 3.4 10*3/uL (ref 1.7–7.7)
Neutrophils Relative %: 63 % (ref 43–77)
Platelets: 292 10*3/uL (ref 150–400)
RBC: 3.47 MIL/uL — ABNORMAL LOW (ref 3.87–5.11)
WBC: 5.4 10*3/uL (ref 4.0–10.5)

## 2013-10-02 MED ORDER — POLYETHYLENE GLYCOL 3350 17 G PO PACK
17.0000 g | PACK | Freq: Every day | ORAL | Status: DC
  Administered 2013-10-02: 17 g via ORAL
  Filled 2013-10-02 (×2): qty 1

## 2013-10-02 NOTE — Progress Notes (Signed)
6 Days Post-Op  Subjective: Pt with no c/o this AM  Objective: Vital signs in last 24 hours: Temp:  [97.9 F (36.6 C)-98.8 F (37.1 C)] 97.9 F (36.6 C) (12/29 0625) Pulse Rate:  [60-68] 60 (12/29 0625) Resp:  [18-20] 19 (12/29 0625) BP: (109-125)/(58-63) 109/58 mmHg (12/29 0625) SpO2:  [100 %] 100 % (12/29 0625) Last BM Date: 09/24/13  Intake/Output from previous day: 12/28 0701 - 12/29 0700 In: 520 [P.O.:520] Out: 300 [Urine:300] Intake/Output this shift:    General appearance: alert and cooperative Incision/Wound: wounds covered with Duoderm, wounds clean, but soupy  Lab Results:   Recent Labs  10/02/13 0427  WBC 5.4  HGB 10.7*  HCT 30.5*  PLT 292   BMET No results found for this basename: NA, K, CL, CO2, GLUCOSE, BUN, CREATININE, CALCIUM,  in the last 72 hours PT/INR No results found for this basename: LABPROT, INR,  in the last 72 hours ABG No results found for this basename: PHART, PCO2, PO2, HCO3,  in the last 72 hours  Studies/Results: No results found.  Anti-infectives: Anti-infectives   Start     Dose/Rate Route Frequency Ordered Stop   09/26/13 1815  ceFAZolin (ANCEF) IVPB 1 g/50 mL premix     1 g 100 mL/hr over 30 Minutes Intravenous  Once 09/26/13 1803 09/26/13 1800      Assessment/Plan: s/p Procedure(s): DEBRIDEMENT ULCERS BILATERAL THIGHS (Bilateral) 1. Con't with wet to dry guaze dressing changes BID 2. DC Duoderm dressing    LOS: 8 days    Rosario Jacks., [facility] 10/02/2013

## 2013-10-02 NOTE — Progress Notes (Signed)
NUTRITION FOLLOW UP  Intervention:   -Continue to recommend Ensure Complete TID -Encouraged PO intake for wound healing -Added afternoon snack to assist in meeting estimated nutritional needs  Nutrition Dx:   Malnutrition related to chronic illness as evidenced by severe fat and muscle wasting.   Goal:   Pt to meet >/= 90% of their estimated nutrition needs   Monitor:   Total protein/energy intake, weights, labs, skin integrity  Assessment:  12/22:Pt with recurrent necrosis of a vasculitic wound that resulted from levamisole exposure due to cocaine use.  Pt crying when I entered her room. MD had just left. Wounds on thigh open to air as WOC RN on her way to see pt. Pt reports decreased appetite. Difficult to complete interview as pt crying. WOC in to see pt.  Pt reports that her wounds had just recently appeared and increased in size quickly. They are very painful and have a foul odor. Pt with recent admissions to [facility] for suicidal ideation.  Pt endorses weight loss but cannot be specific about amount or time frame. Pt with hx of drug abuse and was positive for cocaine on admission     12/29: -Pt's weight increased 6 lbs since RD initial assessment. -PO intake 75%. Pt reported a decreased appetite initially, but has since improved now that pt is calling down and picking food options for meals -Consumes approximately 50% of Ensure Shakes. Assisted living facility upon d/c, recommend to continue with supplementation -Wounds improving per MD -Pt requesting afternoon snack, was willing to try peanut butter and crackers for balanced protein/carbohydrate intake  Height: Ht Readings from Last 1 Encounters:  09/27/13 5' 2"  (1.575 m)    Weight Status:   Wt Readings from Last 1 Encounters:  09/27/13 101 lb 6.6 oz (46 kg)    Estimated Nutritional Needs:  Kcal: 1500-1700  Protein: 75-85 grams  Fluid: >1.5 L/day  Skin: Left lower thigh open wounds  Right thigh closed wounds with  eschar   Diet Order: General w/Ensure TID   Intake/Output Summary (Last 24 hours) at 10/02/13 1053 Last data filed at 10/02/13 0630  Gross per 24 hour  Intake    520 ml  Output    300 ml  Net    220 ml    Last BM: 12/22   Labs:  No results found for this basename: NA, K, CL, CO2, BUN, CREATININE, CALCIUM, MG, PHOS, GLUCOSE,  in the last 168 hours  CBG (last 3)  No results found for this basename: GLUCAP,  in the last 72 hours  Scheduled Meds: . amantadine  100 mg Oral BID  . buPROPion  100 mg Oral BID  . docusate sodium  100 mg Oral BID  . feeding supplement (ENSURE COMPLETE)  237 mL Oral TID BM  . heparin subcutaneous  5,000 Units Subcutaneous Q8H  . multivitamin with minerals  1 tablet Oral Daily  . polyethylene glycol  17 g Oral Daily  . sodium chloride  3 mL Intravenous Q12H  . sodium chloride  3 mL Intravenous Q12H    Continuous Infusions: None  Atlee Abide MS RD LDN Clinical Dietitian [phone]

## 2013-10-02 NOTE — Progress Notes (Signed)
Subjective:  Pt seen and examined in AM. No acute events overnight. Pt reports her bilateral thigh pain and wounds continue to improve. She is changing her dressings regularly. She denies fever, chills, dyspnea, chest pain, nausea, vomiting, abdominal pain, change in urination. She reports being constipated since admission but passing gas normally.    Objective: Vital signs in last 24 hours: Filed Vitals:   10/01/13 0521 10/01/13 1317 10/01/13 2142 10/02/13 0625  BP: 125/64 114/63 125/60 109/58  Pulse: 55 66 68 60  Temp: 97.7 F (36.5 C) 98 F (36.7 C) 98.8 F (37.1 C) 97.9 F (36.6 C)  TempSrc: Oral Oral Oral Oral  Resp: 18 18 20 19   Height:      Weight:      SpO2: 100% 100% 100% 100%   Weight change:   Intake/Output Summary (Last 24 hours) at 10/02/13 1124 Last data filed at 10/02/13 0700  Gross per 24 hour  Intake    620 ml  Output    300 ml  Net    320 ml     Physical Exam  Constitutional: She appears well-developed and well-nourished. No distress, tearful.  HENT:  Head: Normocephalic.  Mouth/Throat: Oropharynx is clear and moist. No oropharyngeal exudate.  Cardiovascular: Normal rate, regular rhythm, normal heart sounds and intact distal pulses. Exam reveals no gallop and no friction rub.  No murmur heard.  Pulmonary/Chest: Effort normal and breath sounds normal. No respiratory distress. She has no wheezes. She has no rales.  Skin: She is not diaphoretic.  Right thigh with two  healing wounds with minimal serous drainage without overlying erythema, swelling, or foul odor. Left thigh with two larger wounds with minimal purulent disharge without overlying erythema, swelling, or foul odor.  Psychiatric: She has a normal mood and affect. Her behavior is normal.   Lab Results: Basic Metabolic Panel: No results found for this basename: NA, K, CL, CO2, GLUCOSE, BUN, CREATININE, CALCIUM, MG, PHOS,  in the last 168 hours Liver Function Tests: No results found for this  basename: AST, ALT, ALKPHOS, BILITOT, PROT, ALBUMIN,  in the last 168 hours No results found for this basename: LIPASE, AMYLASE,  in the last 168 hours No results found for this basename: AMMONIA,  in the last 168 hours CBC:  Recent Labs Lab 10/02/13 0427  WBC 5.4  NEUTROABS 3.4  HGB 10.7*  HCT 30.5*  MCV 87.9  PLT 292   Cardiac Enzymes: No results found for this basename: CKTOTAL, CKMB, CKMBINDEX, TROPONINI,  in the last 168 hours BNP: No results found for this basename: PROBNP,  in the last 168 hours D-Dimer: No results found for this basename: DDIMER,  in the last 168 hours CBG: No results found for this basename: GLUCAP,  in the last 168 hours Hemoglobin A1C: No results found for this basename: HGBA1C,  in the last 168 hours Fasting Lipid Panel: No results found for this basename: CHOL, HDL, LDLCALC, TRIG, CHOLHDL, LDLDIRECT,  in the last 168 hours Thyroid Function Tests: No results found for this basename: TSH, T4TOTAL, FREET4, T3FREE, THYROIDAB,  in the last 168 hours Coagulation: No results found for this basename: LABPROT, INR,  in the last 168 hours Anemia Panel: No results found for this basename: VITAMINB12, FOLATE, FERRITIN, TIBC, IRON, RETICCTPCT,  in the last 168 hours Urine Drug Screen: Drugs of Abuse     Component Value Date/Time   LABOPIA POSITIVE* 09/24/2013 1358   LABOPIA PPS 08/31/2012 1045   COCAINSCRNUR POSITIVE* 09/24/2013 1358  COCAINSCRNUR NEG 08/31/2012 1045   LABBENZ NONE DETECTED 09/24/2013 1358   LABBENZ NEG 08/31/2012 1045   LABBENZ NEGATIVE 06/12/2011 0221   AMPHETMU NONE DETECTED 09/24/2013 1358   AMPHETMU NEGATIVE 06/12/2011 0221   THCU POSITIVE* 09/24/2013 1358   THCU 63.4* 05/20/2012 1405   LABBARB NONE DETECTED 09/24/2013 1358   LABBARB NEG 08/31/2012 1045    Alcohol Level: No results found for this basename: ETH,  in the last 168 hours Urinalysis: No results found for this basename: COLORURINE, APPERANCEUR, LABSPEC, [place],  GLUCOSEU, HGBUR, BILIRUBINUR, [place], PROTEINUR, UROBILINOGEN, NITRITE, LEUKOCYTESUR,  in the last 168 hours  Micro Results: Recent Results (from the past 240 hour(s))  CULTURE, BLOOD (ROUTINE X 2)     Status: None   Collection Time    09/24/13  1:52 PM      Result Value Range Status   Specimen Description BLOOD RIGHT ARM   Final   Special Requests BOTTLES DRAWN AEROBIC AND ANAEROBIC 5CC   Final   Culture  Setup Time     Final   Value: 09/24/2013 17:55     Performed at [facility]     Final   Value: NO GROWTH 5 DAYS     Performed at [facility]   Report Status 09/30/2013 FINAL   Final  URINE CULTURE     Status: None   Collection Time    09/24/13  1:58 PM      Result Value Range Status   Specimen Description URINE, CATHETERIZED   Final   Special Requests NONE   Final   Culture  Setup Time     Final   Value: 09/24/2013 23:11     Performed at [facility]     Final   Value: NO GROWTH     Performed at [facility]   Culture     Final   Value: NO GROWTH     Performed at [facility]   Report Status 09/25/2013 FINAL   Final  CULTURE, BLOOD (ROUTINE X 2)     Status: None   Collection Time    09/24/13  1:59 PM      Result Value Range Status   Specimen Description BLOOD LEFT ARM   Final   Special Requests BOTTLES DRAWN AEROBIC AND ANAEROBIC 4CC   Final   Culture  Setup Time     Final   Value: 09/24/2013 17:55     Performed at [facility]   Culture     Final   Value: NO GROWTH 5 DAYS     Performed at [facility]   Report Status 09/30/2013 FINAL   Final  WOUND CULTURE     Status: None   Collection Time    09/24/13  2:05 PM      Result Value Range Status   Specimen Description LEG LOWER WOUND   Final   Special Requests Immunocompromised   Final   Gram Stain     Final   Value: ABUNDANT WBC PRESENT,BOTH PMN AND MONONUCLEAR     NO SQUAMOUS EPITHELIAL CELLS SEEN     ABUNDANT GRAM NEGATIVE  RODS     ABUNDANT GRAM POSITIVE COCCI IN PAIRS     Performed at [facility]   Culture     Final   Value: ABUNDANT PROTEUS MIRABILIS     Performed at [facility]   Report Status 09/28/2013 FINAL   Final  Organism ID, Bacteria PROTEUS MIRABILIS   Final  WOUND CULTURE     Status: None   Collection Time    09/24/13  2:22 PM      Result Value Range Status   Specimen Description WOUND UPPER WOUND.   Final   Special Requests Immunocompromised   Final   Gram Stain     Final   Value: ABUNDANT WBC PRESENT,BOTH PMN AND MONONUCLEAR     NO SQUAMOUS EPITHELIAL CELLS SEEN     ABUNDANT GRAM NEGATIVE RODS     ABUNDANT GRAM POSITIVE COCCI IN PAIRS     FEW GRAM POSITIVE RODS   Culture     Final   Value: ABUNDANT PROTEUS MIRABILIS     Performed at [facility]   Report Status 09/27/2013 FINAL   Final   Organism ID, Bacteria PROTEUS MIRABILIS   Final  MRSA PCR SCREENING     Status: None   Collection Time    09/24/13  7:51 PM      Result Value Range Status   MRSA by PCR NEGATIVE  NEGATIVE Final   Comment:            The GeneXpert MRSA Assay (FDA     approved for NASAL specimens     only), is one component of a     comprehensive MRSA colonization     surveillance program. It is not     intended to diagnose MRSA     infection nor to guide or     monitor treatment for     MRSA infections.  WOUND CULTURE     Status: None   Collection Time    09/26/13  6:02 PM      Result Value Range Status   Specimen Description WOUND LEFT LEG   Final   Special Requests NONE   Final   Gram Stain     Final   Value: FEW WBC PRESENT, PREDOMINANTLY PMN     NO SQUAMOUS EPITHELIAL CELLS SEEN     ABUNDANT GRAM NEGATIVE RODS     ABUNDANT GRAM POSITIVE COCCI     IN PAIRS   Culture     Final   Value: MODERATE PROTEUS MIRABILIS     MODERATE ENTEROCOCCUS SPECIES     Performed at [facility]   Report Status 09/30/2013 FINAL   Final   Organism ID, Bacteria PROTEUS MIRABILIS    Final   Organism ID, Bacteria ENTEROCOCCUS SPECIES   Final  ANAEROBIC CULTURE     Status: None   Collection Time    09/26/13  6:02 PM      Result Value Range Status   Specimen Description WOUND LEFT LEG   Final   Special Requests NONE   Final   Gram Stain     Final   Value: RARE WBC PRESENT, PREDOMINANTLY PMN     NO SQUAMOUS EPITHELIAL CELLS SEEN     ABUNDANT GRAM NEGATIVE RODS     ABUNDANT GRAM POSITIVE COCCI     IN PAIRS   Culture     Final   Value: NO ANAEROBES ISOLATED     Performed at [facility]   Report Status 10/01/2013 FINAL   Final   Studies/Results: No results found. Medications: I have reviewed the patient's current medications. Scheduled Meds: . amantadine  100 mg Oral BID  . buPROPion  100 mg Oral BID  . docusate sodium  100 mg Oral BID  . feeding supplement (ENSURE COMPLETE)  237 mL Oral TID BM  . heparin subcutaneous  5,000 Units Subcutaneous Q8H  . multivitamin with minerals  1 tablet Oral Daily  . polyethylene glycol  17 g Oral Daily  . sodium chloride  3 mL Intravenous Q12H  . 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 and past history of similar episodes   -Pt to be placed at assisted living facility today -Continue ibuprofen 600 mg Q6hr PRN pain -Blood cultures from 12/21 negative to date  -Left thigh wound cultures from 12/23 (resulted 12/27) with pansensitive Proteus and Enterococcus --->since appears to be adequately healing, will continue to observe for signs of infection and administer  antibiotics if warranted -Pt encouraged to abstain from cocaine use -Appreciate surgery recommendations --> continue with wet to dry guaze dressing changes BID, d/c duoderm dressing  Polysubstance Abuse -  Pt with UDS + for cocaine, amphetamines, and THC -Pt encouraged to abstain from drug use  -Continue amantadine 100 mg BID for cocaine cravings  Chronic mild leukopenia  - improved.  Most likely due to levamisole as it is known to suppress white blood cells. Pt found to be HIV negative. -Continue to monitor   Chronic normocytic anemia - stable with no active bleeding. Etiology unknown -Further work-up (anemia panel) as outpatient  Major Depressive Disorder - currently stable mood without SI -Continue bupropion 100 mg BID   -Continue trazodone 50 mg PRN sleep   Constipation - Pt reports no BM since being in hospital  -Colace 100 mg BID -Start miralax daily   DVT PPx:  Heparin SQ TID  Diet: Regular   Dispo: Disposition is deferred at this time, awaiting improvement of current medical problems.  Anticipated discharge in approximately 1 day(s).   The patient does have a current PCP (Charlann Lange, MD) and does need an [facility] hospital follow-up appointment after discharge.  The patient does not have transportation limitations that hinder transportation to clinic appointments.  .Services Needed at time of discharge: Y = Yes, Blank = No PT:   OT:   RN:   Equipment:   Other:     LOS: 8 days   Juluis Mire, MD 10/02/2013, 11:24 AM

## 2013-10-02 NOTE — Discharge Summary (Signed)
Name: [name] MRN: [id number] DOB: October 17, 1960 52 y.o. PCP: Charlann Lange, MD  Date of Admission: 09/24/2013 11:57 AM Date of Discharge: 10/02/2013 Attending Physician: Larey Dresser, MD  Discharge Diagnosis:  Principal Problem:   Vasculitic leg ulcer Active Problems:   SUBSTANCE ABUSE, MULTIPLE   Arthropathy, unspecified, site unspecified   Cocaine dependence   Suicidal ideation   MDD (major depressive disorder)   Protein-calorie malnutrition, severe  Discharge Medications:   Medication List         amantadine 100 MG capsule  Commonly known as:  SYMMETREL  Take 1 capsule (100 mg total) by mouth 2 (two) times daily. For cravings of cocaine.     buPROPion 100 MG tablet  Commonly known as:  WELLBUTRIN  Take 1 tablet (100 mg total) by mouth 2 (two) times daily. For depression related to cocaine use.     traZODone 50 MG tablet  Commonly known as:  DESYREL  Take one tablet at bedtime for insomnia.        Disposition and follow-up:   [name] was discharged from [facility] in Good condition.  At the hospital follow up visit please address:  1. Adequate healing of necrotic vasculitis (if worsens or signs of infection consider antibiotic course)     Living situation - assisted living placement, pt would like to go back to [place]     Depression     Constipation - if stool softener or laxative warranted     Protein-calore malnutrition - if compliant with feeding supplements       2.  Labs / imaging needed at time of follow-up: CBC (H/H), consider TSH (constipation, depressed mood)  3.  Pending labs/ test needing follow-up: none  Follow-up Appointments: Follow-up Information   Follow up with Clinton Gallant, MD On 10/10/2013. (3:45PM )    Specialty:  Internal Medicine   Contact information:   [address] [place] [address] [phone]       Discharge Instructions: Discharge Orders   Future Appointments Provider Department  Dept Phone   10/10/2013 3:45 PM Clinton Gallant, MD [place] [phone]   Future Orders Complete By Expires   Call MD for:  redness, tenderness, or signs of infection (pain, swelling, redness, odor or green/yellow discharge around incision site)  As directed    Call MD for:  temperature >100.4  As directed    Increase activity slowly  As directed       Consultations: Treatment Team:  [facility], MD  Procedures Performed:  Dg Femur Left  09/24/2013   CLINICAL DATA:  Two sores  EXAM: LEFT FEMUR - 2 VIEW  COMPARISON:  04/11/2010  FINDINGS: There is no evidence of fracture or other focal bone lesions. Soft tissue irregularity anterior to the distal femoral shaft. No radiodense foreign body or subcutaneous gas.  IMPRESSION: No bone abnormality, subcutaneous gas, or foreign body.   Electronically Signed   By: Arne Cleveland M.D.   On: 09/24/2013 15:04    2D Echo: none  Cardiac Cath: none  Admission HPI: Original Note by Dr. Renford Dills is a 52 y.o. woman with PMHx of cocaine abuse c/b vasculitis 2/2 Levimasole, Depression, anemia, and RA, presents to the ED w/ complaints of 2 new left thigh lesions. The wounds began two weeks ago a few days after suing cocaine. The wounds have recently increased in size, developed a foul odor, and are extremely painful.  The patient claims they have been there for some time and she had previously covered them up with a clean "non-stick" pad but recently ran out and began using paper towels to cover them up. She claims they became much larger over the past 2-3 days. She claims to have chills and nausea. Denies purulent drainage from wounds. Denies chest pain SOB.   Of note, the patient was recently discharged from [facility] on 09/05/13 after after having suicidal ideations.     Hospital Course by problem list: Principal Problem:   Vasculitic leg ulcer Active Problems:   SUBSTANCE ABUSE, MULTIPLE   Arthropathy,  unspecified, site unspecified   Cocaine dependence   Suicidal ideation   MDD (major depressive disorder)   Protein-calorie malnutrition, severe    Levamisole-induced vasculitic skin necrosis - Pt presented with exquisitely tender necrotic wounds on her thighs   left worse than right) that were similar to previous episodes of vasculitis (face/ears/nose) with left ear and tongue excisional biopsy 2010 thought to be due to levamisole tinted cocaine use which started 2 weeks prior to admission  Left femur xray revealed no bone abnormality. Pt received surgical left and right thigh wound debridement on 09/26/13 with pathology revealing no evidence of malignancy. Blood cultures were negative for growth. Left thigh wound cultures from 12/23  with pansensitive Proteus and Enterococcus. Due to adequately healing wounds and no systemic signs of infection, antibiotics were not administered during hospitalization (besides for surgery with ancef).  Pt received adequate pain control with IV narcotics and then transitioned to oral NSAID (ibuprofen) therapy. Pt received topical wound care throughout hospitalization and to receive wound care at home which per surgery recommendations is wet to dry gauze dressing changes twice a day. Pt was also provided with gauze at time of discharge. Pt counseled multiple times concerning importance of abstaining from cocaine use. Pt able to ambulate without difficulty.     Polysubstance Abuse - Pt with UDS + for cocaine, amphetamines, and THC. Pt was encouraged to abstain from drug use. Pt was continued on amantadine 100 mg BID for cocaine cravings during hospitalization.    Chronic mild leukopenia - Pt with chronic mild leukopenia most likely due to levamisole tinted cocaine as it is known to suppress white blood cells. Pt found to be HIV negative. WBC normalized during hospitalization.    Chronic normocytic anemia - Pt with stable normocytic anemia without active bleeding during  hospitalization. Etiology unknown, however most likely due to RA. Per records pt was on monthly B12 injections of unknown duration due to low levels in 2010.  Protein-calorie malnutrition - Pt received nutrition supplements during hospitalization without change in weight. Pt to continue supplements after discharge and encouraged to consume high calore and protein diet.     Major Depressive Disorder - Pt with recent Behavioral Health admission. Pt at times depressed and crying during hospitalization without SI. Pt was continued on bupropion 100 mg BID and trazodone 50 mg as needed for sleep during hospitalization.    Hypertension - Pt with asymptomatic blood pressure range of 105/55 -185/95 during hospitalization.     Rheumatoid Arthritis - Pt with no recent flares or requirement for corticosteroids. Pt not on DMARD therapy.   Constipation - Pt reported no BM during hospitalization. Pt received stool softener and laxative therapy as needed.   Homelessness - Pt was denied placement to [facility] due to prior episodes of leaving the facility for days. Pt may be eligible for placement after October 05, 2013 and to follow-up with [facility] visit on 10/10/13 at which time social work will help with placement. Pt discharged to friend's house and address was obtained for home health wound care services. Pt reported she will be safe there.   DVT Prophylaxis - Pt received SQ heparin during hospitalization with no evidence of DVT or HIT syndrome.    Discharge Vitals:   BP 109/58  Pulse 60  Temp(Src) 97.9 F (36.6 C) (Oral)  Resp 19  Ht 5' 2"  (1.575 m)  Wt 46 kg (101 lb 6.6 oz)  BMI 18.54 kg/m2  SpO2 100%  Discharge Labs:  No results found for this or any previous visit (from the past 24 hour(s)).  Signed: Juluis Mire, MD 10/02/2013, 12:00 PM   Time Spent on Discharge: 60 minutes Services Ordered on Discharge: home health RN Equipment Ordered on Discharge: none

## 2013-10-04 NOTE — Discharge Instructions (Signed)
-  Home health RN will come to apply wet to dry guaze dressing changes BID -Stop taking cocaine  -Take OTC Ibuprofen for pain -Please attend you [facility] appointment on January 6 at 3:45 PM - so that you can possibly be placed for [facility]

## 2013-10-10 ENCOUNTER — Encounter (HOSPITAL_COMMUNITY): Payer: Self-pay | Admitting: Emergency Medicine

## 2013-10-10 ENCOUNTER — Inpatient Hospital Stay (HOSPITAL_COMMUNITY)
Admission: EM | Admit: 2013-10-10 | Discharge: 2013-10-17 | DRG: 607 | Disposition: A | Discharge status: Other Acute Inpatient Hospital | Payer: Medicaid Other | Attending: Internal Medicine | Admitting: Internal Medicine

## 2013-10-10 ENCOUNTER — Ambulatory Visit: Payer: Self-pay | Admitting: Internal Medicine

## 2013-10-10 DIAGNOSIS — Z800 Family history of malignant neoplasm of digestive organs: Secondary | ICD-10-CM

## 2013-10-10 DIAGNOSIS — I10 Essential (primary) hypertension: Secondary | ICD-10-CM | POA: Diagnosis present

## 2013-10-10 DIAGNOSIS — D649 Anemia, unspecified: Secondary | ICD-10-CM | POA: Diagnosis present

## 2013-10-10 DIAGNOSIS — E46 Unspecified protein-calorie malnutrition: Secondary | ICD-10-CM | POA: Diagnosis present

## 2013-10-10 DIAGNOSIS — T798XXA Other early complications of trauma, initial encounter: Secondary | ICD-10-CM

## 2013-10-10 DIAGNOSIS — Z9119 Patient's noncompliance with other medical treatment and regimen: Secondary | ICD-10-CM

## 2013-10-10 DIAGNOSIS — Z91199 Patient's noncompliance with other medical treatment and regimen due to unspecified reason: Secondary | ICD-10-CM

## 2013-10-10 DIAGNOSIS — Z5900 Homelessness unspecified: Secondary | ICD-10-CM

## 2013-10-10 DIAGNOSIS — F32A Depression, unspecified: Secondary | ICD-10-CM

## 2013-10-10 DIAGNOSIS — T148XXA Other injury of unspecified body region, initial encounter: Secondary | ICD-10-CM

## 2013-10-10 DIAGNOSIS — L988 Other specified disorders of the skin and subcutaneous tissue: Principal | ICD-10-CM | POA: Diagnosis present

## 2013-10-10 DIAGNOSIS — M069 Rheumatoid arthritis, unspecified: Secondary | ICD-10-CM | POA: Diagnosis present

## 2013-10-10 DIAGNOSIS — T50Z95A Adverse effect of other vaccines and biological substances, initial encounter: Secondary | ICD-10-CM | POA: Diagnosis present

## 2013-10-10 DIAGNOSIS — F329 Major depressive disorder, single episode, unspecified: Secondary | ICD-10-CM

## 2013-10-10 DIAGNOSIS — F339 Major depressive disorder, recurrent, unspecified: Secondary | ICD-10-CM | POA: Diagnosis present

## 2013-10-10 DIAGNOSIS — F1420 Cocaine dependence, uncomplicated: Secondary | ICD-10-CM | POA: Diagnosis present

## 2013-10-10 DIAGNOSIS — Z79899 Other long term (current) drug therapy: Secondary | ICD-10-CM

## 2013-10-10 DIAGNOSIS — E785 Hyperlipidemia, unspecified: Secondary | ICD-10-CM | POA: Diagnosis present

## 2013-10-10 DIAGNOSIS — J069 Acute upper respiratory infection, unspecified: Secondary | ICD-10-CM | POA: Diagnosis present

## 2013-10-10 DIAGNOSIS — R636 Underweight: Secondary | ICD-10-CM

## 2013-10-10 DIAGNOSIS — E43 Unspecified severe protein-calorie malnutrition: Secondary | ICD-10-CM | POA: Diagnosis present

## 2013-10-10 DIAGNOSIS — Z803 Family history of malignant neoplasm of breast: Secondary | ICD-10-CM

## 2013-10-10 DIAGNOSIS — L089 Local infection of the skin and subcutaneous tissue, unspecified: Secondary | ICD-10-CM

## 2013-10-10 DIAGNOSIS — E876 Hypokalemia: Secondary | ICD-10-CM | POA: Diagnosis present

## 2013-10-10 DIAGNOSIS — IMO0002 Reserved for concepts with insufficient information to code with codable children: Secondary | ICD-10-CM | POA: Diagnosis present

## 2013-10-10 DIAGNOSIS — F19959 Other psychoactive substance use, unspecified with psychoactive substance-induced psychotic disorder, unspecified: Secondary | ICD-10-CM

## 2013-10-10 DIAGNOSIS — Z681 Body mass index (BMI) 19 or less, adult: Secondary | ICD-10-CM

## 2013-10-10 DIAGNOSIS — Z886 Allergy status to analgesic agent status: Secondary | ICD-10-CM

## 2013-10-10 DIAGNOSIS — R45851 Suicidal ideations: Secondary | ICD-10-CM

## 2013-10-10 DIAGNOSIS — R627 Adult failure to thrive: Secondary | ICD-10-CM | POA: Diagnosis present

## 2013-10-10 DIAGNOSIS — L97109 Non-pressure chronic ulcer of unspecified thigh with unspecified severity: Secondary | ICD-10-CM | POA: Diagnosis present

## 2013-10-10 DIAGNOSIS — L97909 Non-pressure chronic ulcer of unspecified part of unspecified lower leg with unspecified severity: Secondary | ICD-10-CM

## 2013-10-10 DIAGNOSIS — F17200 Nicotine dependence, unspecified, uncomplicated: Secondary | ICD-10-CM | POA: Diagnosis present

## 2013-10-10 DIAGNOSIS — Z720 Tobacco use: Secondary | ICD-10-CM

## 2013-10-10 MED ORDER — IBUPROFEN 200 MG PO TABS
400.0000 mg | ORAL_TABLET | Freq: Once | ORAL | Status: AC
  Administered 2013-10-10: 400 mg via ORAL
  Filled 2013-10-10: qty 2

## 2013-10-10 NOTE — ED Notes (Signed)
Patient presents with c/o bady aches (for several days) and left leg pain.  Had some sores on her left leg she had some sores on her left leg and was put to sleep and had them opened.  Received no pain med or antibiotics.  Areas on left leg red and tender to touch  EMS BP 170palp 88 heart rate, 18 resp.

## 2013-10-11 ENCOUNTER — Encounter (HOSPITAL_COMMUNITY): Payer: Self-pay | Admitting: General Practice

## 2013-10-11 ENCOUNTER — Emergency Department (HOSPITAL_COMMUNITY): Payer: Medicaid Other

## 2013-10-11 DIAGNOSIS — E46 Unspecified protein-calorie malnutrition: Secondary | ICD-10-CM | POA: Diagnosis present

## 2013-10-11 DIAGNOSIS — T148XXA Other injury of unspecified body region, initial encounter: Secondary | ICD-10-CM

## 2013-10-11 DIAGNOSIS — D72819 Decreased white blood cell count, unspecified: Secondary | ICD-10-CM

## 2013-10-11 DIAGNOSIS — IMO0002 Reserved for concepts with insufficient information to code with codable children: Secondary | ICD-10-CM | POA: Diagnosis present

## 2013-10-11 DIAGNOSIS — D649 Anemia, unspecified: Secondary | ICD-10-CM

## 2013-10-11 DIAGNOSIS — F1910 Other psychoactive substance abuse, uncomplicated: Secondary | ICD-10-CM

## 2013-10-11 DIAGNOSIS — F329 Major depressive disorder, single episode, unspecified: Secondary | ICD-10-CM

## 2013-10-11 DIAGNOSIS — L988 Other specified disorders of the skin and subcutaneous tissue: Secondary | ICD-10-CM

## 2013-10-11 DIAGNOSIS — I10 Essential (primary) hypertension: Secondary | ICD-10-CM

## 2013-10-11 DIAGNOSIS — L089 Local infection of the skin and subcutaneous tissue, unspecified: Secondary | ICD-10-CM | POA: Diagnosis present

## 2013-10-11 DIAGNOSIS — E43 Unspecified severe protein-calorie malnutrition: Secondary | ICD-10-CM

## 2013-10-11 DIAGNOSIS — M069 Rheumatoid arthritis, unspecified: Secondary | ICD-10-CM

## 2013-10-11 LAB — CBC WITH DIFFERENTIAL/PLATELET
BASOS ABS: 0 10*3/uL (ref 0.0–0.1)
Basophils Absolute: 0 10*3/uL (ref 0.0–0.1)
Basophils Relative: 0 % (ref 0–1)
Basophils Relative: 0 % (ref 0–1)
EOS ABS: 0.1 10*3/uL (ref 0.0–0.7)
EOS PCT: 2 % (ref 0–5)
Eosinophils Absolute: 0.1 10*3/uL (ref 0.0–0.7)
Eosinophils Relative: 2 % (ref 0–5)
HCT: 30 % — ABNORMAL LOW (ref 36.0–46.0)
HEMATOCRIT: 30.2 % — AB (ref 36.0–46.0)
Hemoglobin: 10.4 g/dL — ABNORMAL LOW (ref 12.0–15.0)
Hemoglobin: 10.5 g/dL — ABNORMAL LOW (ref 12.0–15.0)
LYMPHS ABS: 1.6 10*3/uL (ref 0.7–4.0)
LYMPHS PCT: 49 % — AB (ref 12–46)
Lymphocytes Relative: 46 % (ref 12–46)
Lymphs Abs: 1.5 10*3/uL (ref 0.7–4.0)
MCH: 29.8 pg (ref 26.0–34.0)
MCH: 29.8 pg (ref 26.0–34.0)
MCHC: 34.7 g/dL (ref 30.0–36.0)
MCHC: 34.8 g/dL (ref 30.0–36.0)
MCV: 85.8 fL (ref 78.0–100.0)
MCV: 86 fL (ref 78.0–100.0)
MONO ABS: 0 10*3/uL — AB (ref 0.1–1.0)
Monocytes Absolute: 0 10*3/uL — ABNORMAL LOW (ref 0.1–1.0)
Monocytes Relative: 1 % — ABNORMAL LOW (ref 3–12)
Monocytes Relative: 1 % — ABNORMAL LOW (ref 3–12)
NEUTROS PCT: 52 % (ref 43–77)
Neutro Abs: 1.6 10*3/uL — ABNORMAL LOW (ref 1.7–7.7)
Neutro Abs: 1.7 10*3/uL (ref 1.7–7.7)
Neutrophils Relative %: 48 % (ref 43–77)
Platelets: 243 10*3/uL (ref 150–400)
Platelets: 250 10*3/uL (ref 150–400)
RBC: 3.52 MIL/uL — AB (ref 3.87–5.11)
RBC: 3.49 MIL/uL — ABNORMAL LOW (ref 3.87–5.11)
RDW: 13.9 % (ref 11.5–15.5)
RDW: 13.9 % (ref 11.5–15.5)
WBC: 3.3 10*3/uL — AB (ref 4.0–10.5)
WBC: 3.4 10*3/uL — ABNORMAL LOW (ref 4.0–10.5)

## 2013-10-11 LAB — URINALYSIS, ROUTINE W REFLEX MICROSCOPIC
Bilirubin Urine: NEGATIVE
GLUCOSE, UA: NEGATIVE mg/dL
KETONES UR: NEGATIVE mg/dL
Nitrite: NEGATIVE
Protein, ur: NEGATIVE mg/dL
Specific Gravity, Urine: 1.015 (ref 1.005–1.030)
Urobilinogen, UA: 0.2 mg/dL (ref 0.0–1.0)
pH: 6 (ref 5.0–8.0)

## 2013-10-11 LAB — COMPREHENSIVE METABOLIC PANEL
ALBUMIN: 2.9 g/dL — AB (ref 3.5–5.2)
ALT: 8 U/L (ref 0–35)
AST: 14 U/L (ref 0–37)
Alkaline Phosphatase: 71 U/L (ref 39–117)
BUN: 13 mg/dL (ref 6–23)
CALCIUM: 8.7 mg/dL (ref 8.4–10.5)
CO2: 25 mEq/L (ref 19–32)
Chloride: 106 mEq/L (ref 96–112)
Creatinine, Ser: 0.83 mg/dL (ref 0.50–1.10)
GFR calc non Af Amer: 81 mL/min — ABNORMAL LOW (ref >90)
GLUCOSE: 83 mg/dL (ref 70–99)
Potassium: 3.4 mEq/L — ABNORMAL LOW (ref 3.7–5.3)
SODIUM: 143 meq/L (ref 137–147)
TOTAL PROTEIN: 6.4 g/dL (ref 6.0–8.3)
Total Bilirubin: <0.2 mg/dL — ABNORMAL LOW (ref 0.3–1.2)

## 2013-10-11 LAB — URINE MICROSCOPIC-ADD ON

## 2013-10-11 LAB — C-REACTIVE PROTEIN: CRP: 0.6 mg/dL — ABNORMAL HIGH (ref <0.60)

## 2013-10-11 LAB — RAPID URINE DRUG SCREEN, HOSP PERFORMED
Amphetamines: NOT DETECTED
Barbiturates: NOT DETECTED
Benzodiazepines: NOT DETECTED
Cocaine: POSITIVE — AB
Opiates: NOT DETECTED
TETRAHYDROCANNABINOL: NOT DETECTED

## 2013-10-11 LAB — POCT I-STAT, CHEM 8
BUN: 11 mg/dL (ref 6–23)
CHLORIDE: 105 meq/L (ref 96–112)
CREATININE: 0.9 mg/dL (ref 0.50–1.10)
Calcium, Ion: 1.19 mmol/L (ref 1.12–1.23)
Glucose, Bld: 123 mg/dL — ABNORMAL HIGH (ref 70–99)
HCT: 29 % — ABNORMAL LOW (ref 36.0–46.0)
Hemoglobin: 9.9 g/dL — ABNORMAL LOW (ref 12.0–15.0)
Potassium: 3 mEq/L — ABNORMAL LOW (ref 3.7–5.3)
Sodium: 141 mEq/L (ref 137–147)
TCO2: 23 mmol/L (ref 0–100)

## 2013-10-11 LAB — MAGNESIUM: Magnesium: 1.8 mg/dL (ref 1.5–2.5)

## 2013-10-11 LAB — PREALBUMIN: Prealbumin: 14.7 mg/dL — ABNORMAL LOW (ref 17.0–34.0)

## 2013-10-11 MED ORDER — HEPARIN SODIUM (PORCINE) 5000 UNIT/ML IJ SOLN
5000.0000 [IU] | Freq: Three times a day (TID) | INTRAMUSCULAR | Status: DC
  Administered 2013-10-11 – 2013-10-17 (×19): 5000 [IU] via SUBCUTANEOUS
  Filled 2013-10-11 (×22): qty 1

## 2013-10-11 MED ORDER — ONDANSETRON HCL 4 MG PO TABS: 4.0000 mg | ORAL_TABLET | Freq: Four times a day (QID) | ORAL | Status: DC | PRN

## 2013-10-11 MED ORDER — BUPROPION HCL 100 MG PO TABS
100.0000 mg | ORAL_TABLET | Freq: Two times a day (BID) | ORAL | Status: DC
  Administered 2013-10-11 – 2013-10-17 (×13): 100 mg via ORAL
  Filled 2013-10-11 (×16): qty 1

## 2013-10-11 MED ORDER — TETANUS-DIPHTH-ACELL PERTUSSIS 5-2.5-18.5 LF-MCG/0.5 IM SUSP
INTRAMUSCULAR | Status: AC
  Filled 2013-10-11: qty 0.5

## 2013-10-11 MED ORDER — OXYCODONE HCL 5 MG PO TABS
5.0000 mg | ORAL_TABLET | ORAL | Status: DC | PRN
  Administered 2013-10-11 – 2013-10-17 (×16): 10 mg via ORAL
  Filled 2013-10-11 (×16): qty 2

## 2013-10-11 MED ORDER — ONDANSETRON HCL 4 MG/2ML IJ SOLN
4.0000 mg | Freq: Four times a day (QID) | INTRAMUSCULAR | Status: DC | PRN
  Administered 2013-10-14: 4 mg via INTRAVENOUS
  Filled 2013-10-11: qty 2

## 2013-10-11 MED ORDER — PIPERACILLIN-TAZOBACTAM 3.375 G IVPB
3.3750 g | Freq: Three times a day (TID) | INTRAVENOUS | Status: DC
  Administered 2013-10-11: 3.375 g via INTRAVENOUS
  Filled 2013-10-11: qty 50

## 2013-10-11 MED ORDER — AMANTADINE HCL 100 MG PO CAPS
100.0000 mg | ORAL_CAPSULE | Freq: Two times a day (BID) | ORAL | Status: DC
  Administered 2013-10-11 – 2013-10-17 (×13): 100 mg via ORAL
  Filled 2013-10-11 (×16): qty 1

## 2013-10-11 MED ORDER — DOCUSATE SODIUM 100 MG PO CAPS
100.0000 mg | ORAL_CAPSULE | Freq: Two times a day (BID) | ORAL | Status: DC
  Administered 2013-10-11 – 2013-10-17 (×13): 100 mg via ORAL
  Filled 2013-10-11 (×16): qty 1

## 2013-10-11 MED ORDER — PIPERACILLIN-TAZOBACTAM 3.375 G IVPB 30 MIN
3.3750 g | Freq: Once | INTRAVENOUS | Status: AC
  Administered 2013-10-11: 3.375 g via INTRAVENOUS

## 2013-10-11 MED ORDER — POTASSIUM CHLORIDE CRYS ER 20 MEQ PO TBCR
40.0000 meq | EXTENDED_RELEASE_TABLET | Freq: Once | ORAL | Status: AC
Start: 2013-10-11 — End: 2013-10-11
  Administered 2013-10-11: 40 meq via ORAL

## 2013-10-11 MED ORDER — TRAZODONE HCL 50 MG PO TABS
50.0000 mg | ORAL_TABLET | Freq: Every evening | ORAL | Status: DC | PRN
  Administered 2013-10-14 – 2013-10-15 (×2): 50 mg via ORAL
  Filled 2013-10-11 (×3): qty 1

## 2013-10-11 MED ORDER — POTASSIUM CHLORIDE CRYS ER 20 MEQ PO TBCR
40.0000 meq | EXTENDED_RELEASE_TABLET | Freq: Once | ORAL | Status: AC
  Administered 2013-10-11: 40 meq via ORAL
  Filled 2013-10-11: qty 2

## 2013-10-11 MED ORDER — PIPERACILLIN-TAZOBACTAM 3.375 G IVPB
INTRAVENOUS | Status: AC
  Filled 2013-10-11: qty 50

## 2013-10-11 MED ORDER — SODIUM CHLORIDE 0.9 % IJ SOLN: 3.0000 mL | INTRAMUSCULAR | Status: DC | PRN

## 2013-10-11 MED ORDER — VANCOMYCIN HCL IN DEXTROSE 1-5 GM/200ML-% IV SOLN
1000.0000 mg | Freq: Once | INTRAVENOUS | Status: AC
  Administered 2013-10-11: 1000 mg via INTRAVENOUS
  Filled 2013-10-11: qty 200

## 2013-10-11 MED ORDER — VANCOMYCIN HCL 500 MG IV SOLR
500.0000 mg | Freq: Two times a day (BID) | INTRAVENOUS | Status: DC
  Filled 2013-10-11: qty 500

## 2013-10-11 MED ORDER — PRO-STAT SUGAR FREE PO LIQD
30.0000 mL | Freq: Two times a day (BID) | ORAL | Status: DC
  Administered 2013-10-12 – 2013-10-16 (×10): 30 mL via ORAL
  Filled 2013-10-11 (×12): qty 30

## 2013-10-11 MED ORDER — MORPHINE SULFATE 2 MG/ML IJ SOLN
1.0000 mg | INTRAMUSCULAR | Status: DC | PRN
  Administered 2013-10-11: 1 mg via INTRAVENOUS
  Filled 2013-10-11: qty 1

## 2013-10-11 MED ORDER — SODIUM CHLORIDE 0.9 % IJ SOLN
3.0000 mL | Freq: Two times a day (BID) | INTRAMUSCULAR | Status: DC
  Administered 2013-10-11: 20:00:00 via INTRAVENOUS
  Administered 2013-10-11 – 2013-10-15 (×8): 3 mL via INTRAVENOUS
  Filled 2013-10-11: qty 3

## 2013-10-11 MED ORDER — POTASSIUM CHLORIDE CRYS ER 20 MEQ PO TBCR
EXTENDED_RELEASE_TABLET | ORAL | Status: AC
  Filled 2013-10-11: qty 2

## 2013-10-11 MED ORDER — MAGNESIUM SULFATE 40 MG/ML IJ SOLN
2.0000 g | Freq: Once | INTRAMUSCULAR | Status: AC
Start: 2013-10-11 — End: 2013-10-11
  Administered 2013-10-11: 2 g via INTRAVENOUS
  Filled 2013-10-11: qty 50

## 2013-10-11 MED ORDER — SODIUM CHLORIDE 0.9 % IV SOLN
250.0000 mL | INTRAVENOUS | Status: DC | PRN
  Administered 2013-10-11: 250 mL via INTRAVENOUS

## 2013-10-11 MED ORDER — ENSURE COMPLETE PO LIQD
237.0000 mL | Freq: Two times a day (BID) | ORAL | Status: DC
  Administered 2013-10-12 – 2013-10-15 (×7): 237 mL via ORAL

## 2013-10-11 MED ORDER — OXYCODONE HCL 5 MG PO TABS: 5.0000 mg | ORAL_TABLET | ORAL | Status: DC | PRN

## 2013-10-11 MED ORDER — TETANUS-DIPHTH-ACELL PERTUSSIS 5-2.5-18.5 LF-MCG/0.5 IM SUSP
0.5000 mL | Freq: Once | INTRAMUSCULAR | Status: AC
  Administered 2013-10-11: 0.5 mL via INTRAMUSCULAR

## 2013-10-11 NOTE — Progress Notes (Signed)
Subjective: [name] is a 53 year old female with levamisole-induced vasculitis, polysubstance abuse, non compliance,  presents to [facility] with complaints of non healing BLE wounds.  She underwent an I&D by Dr. Hulen Skains on 09/26/13 she was subsequently discharged home with home health, however, states that home health has not been coming by.  She reports that she has been showering daily, cleansing wound and doing daily dressing changes.  Per EDP, the dressing was dirty and appeared as if it has not been changed in days unfortunately.  She is homeless and has been staying with a friend.  She was started on Vanc and Zosyn in the ED.  She has a normal white count, afebrile, vss.    Objective: Vital signs in last 24 hours: Temp:  [98.2 F (36.8 C)-99.1 F (37.3 C)] 98.2 F (36.8 C) (01/07 0307) Pulse Rate:  [65-83] 65 (01/07 0630) Resp:  [18-20] 18 (01/07 0215) BP: (116-150)/(68-80) 124/71 mmHg (01/07 0630) SpO2:  [92 %-100 %] 100 % (01/07 0630) Weight:  [100 lb 7 oz (45.558 kg)] 100 lb 7 oz (45.558 kg) (01/06 2136)    Intake/Output from previous day:   Intake/Output this shift:    General appearance: alert, cooperative and no distress Incision/Wound: bilateral distal thigh wounds are clean, beefy red, no purulent drainage surrounding erythema or induration.   Lab Results:   Recent Labs  10/11/13 0034 10/11/13 0038 10/11/13 0145  WBC 3.4*  --  3.3*  HGB 10.4* 9.9* 10.5*  HCT 30.0* 29.0* 30.2*  PLT 250  --  243   BMET  Recent Labs  10/11/13 0038 10/11/13 0520  NA 141 143  K 3.0* 3.4*  CL 105 106  CO2  --  25  GLUCOSE 123* 83  BUN 11 13  CREATININE 0.90 0.83  CALCIUM  --  8.7   PT/INR No results found for this basename: LABPROT, INR,  in the last 72 hours ABG No results found for this basename: PHART, PCO2, PO2, HCO3,  in the last 72 hours  Studies/Results: Dg Femur Left  10/11/2013   CLINICAL DATA:  Open sores near the knee.  Evaluate for infection  EXAM: LEFT  FEMUR - 2 VIEW  COMPARISON:  09/24/2013  FINDINGS: There is a persistent ulcer along the anterior lower thigh. No subcutaneous emphysema. No radiodense foreign body. No changes of osteomyelitis.  Left hip osteoarthritis with marginal spurring.  IMPRESSION: No evidence of osteomyelitis or foreign body near the lower thigh ulcer.   Electronically Signed   By: Jorje Guild M.D.   On: 10/11/2013 01:24    Anti-infectives: Anti-infectives   Start     Dose/Rate Route Frequency Ordered Stop   10/11/13 1500  vancomycin (VANCOCIN) 500 mg in sodium chloride 0.9 % 100 mL IVPB     500 mg 100 mL/hr over 60 Minutes Intravenous Every 12 hours 10/11/13 0459     10/11/13 1000  piperacillin-tazobactam (ZOSYN) IVPB 3.375 g     3.375 g 12.5 mL/hr over 240 Minutes Intravenous 3 times per day 10/11/13 0459     10/11/13 0145  vancomycin (VANCOCIN) IVPB 1000 mg/200 mL premix     1,000 mg 200 mL/hr over 60 Minutes Intravenous  Once 10/11/13 0131 10/11/13 0405   10/11/13 0145  piperacillin-tazobactam (ZOSYN) IVPB 3.375 g     3.375 g 100 mL/hr over 30 Minutes Intravenous  Once 10/11/13 0131 10/11/13 0233      Assessment/Plan: Bilateral distal thigh wounds S/p I&D 12/23 Wounds are clean, there is  no need for further debridement.  Recommend at least once daily dressing changes, address the patients hygiene and living situation.  Will discuss with primary team, attempted to call several times, awaiting call back from pager [phone].  Surgery will sign off please call [facility] with questions or concerns.    LOS: 1 day    Arlys Scatena ANP-BC 10/11/2013 8:06 AM

## 2013-10-11 NOTE — Consult Note (Signed)
WOC wound consult note Reason for Consult:Consult requested for chronic full thickness wounds to BLE.  Pt is familiar to [facility] team from multiple previous admissions for Levamisole Vasculitis. Refer to previous progress notes from 12/22.  Pt had debridement of necrotic wounds by [facility] team during the last admission.  Pt is familiar with wound care routine and applies either Medihoney or wound gel and nonadherent dressing when not in the hospital setting. None of the wounds have an appearance that is consistent with infection at this time. Wound type:  Left leg with 2 areas of full thickness wounds; 6.5X4X.2cm and 3.5X3X.2cm Right leg 1X2X.1cm. Wound bed: All wounds are 95% beefy red, 5% yellow Drainage (amount, consistency, odor) Mod amt yellow drainage, no odor. Periwound: Intact skin surrounding Dressing procedure/placement/frequency:Applied Medihoney hydrocolloid dressing to left leg wounds; this is a dressing which pt has been using prior to admission.  It is not available in [place].  She states she plans to transfer to [facility].  If they have this product, it has been helpful in the past to promote healing to other wounds this pt developed.  It is antimicrobial, absorbs drainage, and protects wound bed.  Silicone foam dressing applied to right leg wound which has only small amt drainage. If patient is admitted to [facility] and does not go to SNF, then plan of care is to apply wound gel and foam dressing to promote moist healing. Please re-consult if further assistance is needed.  Thank-you,  Julien Girt MSN, [place], [place], [place], [place]

## 2013-10-11 NOTE — ED Notes (Signed)
Pt reports recent depression, denies SI at this time.

## 2013-10-11 NOTE — Progress Notes (Signed)
Case Manager consult to see patient for potential discharge planning.CM role explained.Patient verbalizes understanding.Patient has had 8 ED visits in the past  Six Months.Patient reports it is difficult to see her MD and get appointments.This CM educated patient on number of ED visits and explained she can help patient  Establish a PCP at the [facility].Patient has MEDICAID [place]/ [place].Patient educated she can contact a MEDICAID case worker by calling the number  On her MEDICAID card.Patient reports no barriers to obtaining her daily medications.Patient reports she was homeless but is residing with friends.Social Worker is  Addressing patients Social needs. Social worker addressed patients Cocaine drug use and patient educated on help programs.CM provided education on Tobacco  Cessation and Nutrition needs and the importance of this is relation to her wound healing.Patient has nonhealing BLE wounds.

## 2013-10-11 NOTE — H&P (Signed)
Date: 10/11/2013               Patient Name:  [name] MRN: [id number]  DOB: 11/13/1960 Age / Sex: 53 y.o., female   PCP: No Pcp Per Patient           Medical Service: Internal Medicine Teaching Service         Attending Physician: Dr. Sid Falcon, MD    First Contact: Dr. Juluis Mire, MD Pager: [phone]  Second Contact: Dr. Jerene Pitch, MD Pager: [phone]       After Hours (After 5p/  First Contact Pager: [phone]  weekends / holidays): Second Contact Pager: [phone]    Most Recent Discharge Date:  10/02/13  Chief Complaint:  Chief Complaint  Patient presents with  . Generalized Body Aches  . Leg Pain       History of present illness: Pt is a 53 y.o. female with a PMH of cocaine abuse with associated levamisole-induced skin necrosis, depression, normocytic anemia, and RA who presents to the ED with c/o non-healing bilateral wounds on the lower extremities.  She was discharged on 12/29 for levamisole-induced vasculitic skin necrosis after bilateral debridement.  BC were negative for growth.  Left wound cx grew proteus and enterococcus however, she was not given antibiotics d/t adequately healing wounds without signs of systemic infection.  Pt was to continue wet to dry dressing changes upon discharge and was provided gauze but apparently has only been changing the dressings once daily.  Pt is homeless but was discharged to a friend's house with [facility] wound care nursing but states they never came.  Associated symptoms include weakness, fever/chills, URI symptoms including cough, pharyngitis, and rhinorrhea.  She denies any CP, SOB, abdominal pain, N/V/D/C, or urinary symptoms.  She reports pain with weight bearing of her LE.  She smokes 2-3 cigs/day for 42 years.  She is "trying to" stay away from cocaine.    In the ED, surgery was consulted (Dr. Grandville Silos) who will see pt in the AM.  She was started on vanco/zosyn.  BC were obtained prior to pt receiving antibiotics.   Dressings were changed in the ED with betadine and bacitracin were applied.  Pt given Tdap, ibuprofen for pain, and 53mq of potassium d/t hypokalemia.  Left femur XR revealed no evidence of osteomyelitis.     Meds: No current facility-administered medications for this encounter.   Current Outpatient Prescriptions  Medication Sig Dispense Refill  . amantadine (SYMMETREL) 100 MG capsule Take 1 capsule (100 mg total) by mouth 2 (two) times daily. For cravings of cocaine.  60 capsule  0  . buPROPion (WELLBUTRIN) 100 MG tablet Take 1 tablet (100 mg total) by mouth 2 (two) times daily. For depression related to cocaine use.  60 tablet  0  . traZODone (DESYREL) 50 MG tablet Take one tablet at bedtime for insomnia.  15 tablet  0    (Not in a hospital admission)  Allergies: Allergies as of 10/10/2013 - Review Complete 10/10/2013  Allergen Reaction Noted  . Acetaminophen Swelling     PMH: Past Medical History  Diagnosis Date  . 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 Followed by Dr. NLouanne Skye . Hypertension   . Cocaine abuse     ongoing with resultant vaculitis.  . Tobacco abuse   . Depression   . Normocytic anemia     BL Hgb 9.8-12. Last anemia panel 04/2010 - showing Fe 19, ferritin 101.  Pt on monthly  B12 injections  . Rheumatoid arthritis(714.0)     patient reported  . Inflammatory arthritis     PSH: Past Surgical History  Procedure Laterality Date  . Skin biopsy      bilateral shin nodules on 04/2010  . Hernia repair    . Irrigation and debridement abscess Bilateral 09/26/2013    Procedure: DEBRIDEMENT ULCERS BILATERAL THIGHS;  Surgeon: Gwenyth Ober, MD;  Location: [place];  Service: General;  Laterality: Bilateral;    FH: Family History  Problem Relation Age of Onset  . Breast cancer Mother     Breast cancer  . Alcohol abuse Mother   . Colon cancer Maternal Aunt 12  . Alcohol abuse Father     SH: History  Substance Use Topics  . Smoking status: Current Every Day  Smoker -- 0.50 packs/day for 39 years    Types: Cigarettes  . Smokeless tobacco: Never Used     Comment: trying  . Alcohol Use: No    Review of Systems: Pertinent items are noted in HPI.  Physical Exam: Blood pressure 118/71, pulse 66, temperature 98.2 F (36.8 C), temperature source Oral, resp. rate 18, weight 45.558 kg (100 lb 7 oz), SpO2 100.00%.  Physical Exam  Constitutional: She is oriented to person, place, and time. No distress.  HENT:  Head: Normocephalic and atraumatic.  Mouth/Throat: Oropharynx is clear and moist.  Eyes: Conjunctivae and EOM are normal. Pupils are equal, round, and reactive to light. No scleral icterus.  Neck: Neck supple.  Cardiovascular: Normal rate, regular rhythm, normal heart sounds and intact distal pulses.  Exam reveals no gallop and no friction rub.   No murmur heard. Pulmonary/Chest: Effort normal and breath sounds normal. No respiratory distress. She has no wheezes. She has no rales.  Abdominal: Soft. Bowel sounds are normal. She exhibits no distension. There is no tenderness.  Musculoskeletal:       Left knee: She exhibits decreased range of motion.       Right upper leg: She exhibits no tenderness, no swelling and no edema.       Left upper leg: She exhibits no tenderness, no swelling and no edema.  b/l LE wounds are covered with dressings which we did not remove.    Neurological: She is alert and oriented to person, place, and time.  Skin: Skin is warm. She is not diaphoretic.    Lab results:  Basic Metabolic Panel:  Recent Labs  10/11/13 0038  NA 141  K 3.0*  CL 105  GLUCOSE 123*  BUN 11  CREATININE 0.90   Anion Gap:    Liver Function Tests: No results found for this basename: AST, ALT, ALKPHOS, BILITOT, PROT, ALBUMIN,  in the last 72 hours No results found for this basename: LIPASE, AMYLASE,  in the last 72 hours No results found for this basename: AMMONIA,  in the last 72 hours  CBC:    Component Value Date/Time    WBC 3.3* 10/11/2013 0145   RBC 3.52* 10/11/2013 0145   RBC 4.44 02/15/2012 0532   HGB 10.5* 10/11/2013 0145   HCT 30.2* 10/11/2013 0145   PLT 243 10/11/2013 0145   MCV 85.8 10/11/2013 0145   MCH 29.8 10/11/2013 0145   MCHC 34.8 10/11/2013 0145   RDW 13.9 10/11/2013 0145   LYMPHSABS 1.6 10/11/2013 0145   MONOABS 0.0* 10/11/2013 0145   EOSABS 0.1 10/11/2013 0145   BASOSABS 0.0 10/11/2013 0145    Cardiac Enzymes: No results found for this basename: TROPIPOC,  in the last 72 hours Lab Results  Component Value Date   CKTOTAL 138 02/14/2012   CKMB 4.1* 02/14/2012   TROPONINI <0.30 03/30/2012    BNP: No results found for this basename: PROBNP,  in the last 72 hours  D-Dimer: No results found for this basename: DDIMER,  in the last 72 hours  CBG: No results found for this basename: GLUCAP,  in the last 72 hours  Hemoglobin A1C: No results found for this basename: HGBA1C,  in the last 72 hours  Lipid Panel: No results found for this basename: CHOL, HDL, LDLCALC, TRIG, CHOLHDL, LDLDIRECT,  in the last 72 hours  Thyroid Function Tests: No results found for this basename: TSH, T4TOTAL, FREET4, T3FREE, THYROIDAB,  in the last 72 hours  Anemia Panel: No results found for this basename: VITAMINB12, FOLATE, FERRITIN, TIBC, IRON, RETICCTPCT,  in the last 72 hours  Coagulation: No results found for this basename: LABPROT, INR,  in the last 72 hours  Urine Drug Screen: Drugs of Abuse:     Component Value Date/Time   LABOPIA NONE DETECTED 10/11/2013 0302   LABOPIA PPS 08/31/2012 1045   COCAINSCRNUR POSITIVE* 10/11/2013 0302   COCAINSCRNUR NEG 08/31/2012 1045   LABBENZ NONE DETECTED 10/11/2013 0302   LABBENZ NEG 08/31/2012 1045   LABBENZ NEGATIVE 06/12/2011 0221   AMPHETMU NONE DETECTED 10/11/2013 0302   AMPHETMU NEGATIVE 06/12/2011 0221   THCU NONE DETECTED 10/11/2013 0302   THCU 63.4* 05/20/2012 1405   LABBARB NONE DETECTED 10/11/2013 0302   LABBARB NEG 08/31/2012 1045    Alcohol Level: No results found for this  basename: ETH,  in the last 72 hours  Urinalysis:  Recent Labs  10/11/13 0302  COLORURINE YELLOW  LABSPEC 1.015  PHURINE 6.0  GLUCOSEU NEGATIVE  HGBUR TRACE*  [place] 0.2  NITRITE NEGATIVE  LEUKOCYTESUR SMALL*    Imaging results:  Dg Femur Left  10/11/2013   CLINICAL DATA:  Open sores near the knee.  Evaluate for infection  EXAM: LEFT FEMUR - 2 VIEW  COMPARISON:  09/24/2013  FINDINGS: There is a persistent ulcer along the anterior lower thigh. No subcutaneous emphysema. No radiodense foreign body. No changes of osteomyelitis.  Left hip osteoarthritis with marginal spurring.  IMPRESSION: No evidence of osteomyelitis or foreign body near the lower thigh ulcer.   Electronically Signed   By: Jorje Guild M.D.   On: 10/11/2013 01:24    EKG:  Date/Time:    Ventricular Rate:    PR Interval:    QRS Duration:   QT Interval:    QTC Calculation:   R Axis:     Text Interpretation:     Antibiotics: Antibiotics Given (last 72 hours)   None      Anti-infectives   Start     Dose/Rate Route Frequency Ordered Stop   10/11/13 0145  vancomycin (VANCOCIN) IVPB 1000 mg/200 mL premix     1,000 mg 200 mL/hr over 60 Minutes Intravenous  Once 10/11/13 0131 10/11/13 0405   10/11/13 0145  piperacillin-tazobactam (ZOSYN) IVPB 3.375 g     3.375 g 100 mL/hr over 30 Minutes Intravenous  Once 10/11/13 0131 10/11/13 0233      SIRS/Sepsis/Septic Shock criteria met: No   Consults:    Assessment & Plan by Problem: Active Problems:   Infected open wound  Pt is a 53 y.o. female with a PMH of cocaine abuse with associated levamisole-induced skin necrosis, depression, normocytic anemia, and  RA who presents to the ED with c/o non-healing bilateral wounds on the lower extremities.    Patient Active Problem List   Diagnosis Date Noted  . Infected open wound 10/11/2013  . Protein-calorie malnutrition, severe 09/25/2013  .  Vasculitic leg ulcer 09/24/2013  . MDD (major depressive disorder) 08/30/2013  . Psychoactive substance-induced organic mood disorder 06/24/2013  . Cocaine abuse with intoxication and without complication 38/38/1840  . Suicidal ideation 06/24/2013  . Vaginal discharge 06/19/2013  . Cocaine dependence 05/15/2013  . Psychosis 05/15/2013  . Drug-induced psychotic disorder 05/14/2013  . Tobacco abuse 01/03/2013  . hyperlipidemia 01/03/2013  . Left arm cellulitis 06/26/2012  . Underweight 05/20/2012  . Depression 02/13/2012  . Hand pain, left 02/13/2012  . Pericardial effusion, acute 10/14/2011  . Chest pain 10/13/2011  . Hypokalemia 10/13/2011  . Skin infection 10/13/2011  . Breast mass, left 09/17/2011  . Arthropathy, unspecified, site unspecified 09/17/2010  . SUBSTANCE ABUSE, MULTIPLE 05/08/2010  . VASCULITIS 04/17/2010  . DEPRESSION, SEVERE 04/11/2010  . ESSENTIAL HYPERTENSION 02/26/2010    Problem List Items Addressed This Visit   None    Visit Diagnoses   Wound infection, initial encounter    -  Primary    Relevant Medications       vancomycin (VANCOCIN) IVPB 1000 mg/200 mL premix (Completed)      #Levamisole-induced vasculitic skin necrosis - Pt readmitted with non-healing bilateral LE wounds with associated fever/chills; started on vancomycin/zosyn, BC drawn prior to antibiotics.  Left XR femur revealed no osteomyelitis.  Surgery consulted and will see in the AM.   -appreciate surgery consult -BC pending -morphine PRN pain -oxycodone 86m q4hrs PRN -CRP -consider MRI pending surgery evaluation  #Polysubstance Abuse - Pt reports "trying to stay away" from cocaine.   -UDS cocaine + 10/11/12 -amantadine 106mbid  #Chronic anemia and leukopenia - Pt with chronic normocytic anemia/mild leukopenia of unclear etiology.  HIV negative.  Possibilities include BM suppression from levamisole, RA, vitamin/mineral deficiencies, medications. -continue to  monitor  #Protein-calorie malnutrition, severe - Stable.  Pt with mild hypoalbuminemia in the past which will delay wound healing.  -prealbumin -nutrition consult  #Major Depressive Disorder - Pt with recent [facility]. -continue home meds  #Hypertension - Stable.   #Rheumatoid Arthritis - Stable.  #Homelessness - Pt currently staying with a friend.  According to d/c notes, [facility] to prior episodes of leaving the facility for days. Pt may be eligible for placement after October 05, 2013? -Consult CSW for placement  #FEN- NS-None  Electrolytes-Replete as needed; checking magnesium.  Diet-Regular    #VTE prophylaxis- 5000 Units Heparin SQ tid    #Dispo- Disposition is deferred at this time, awaiting improvement of current medical problems.  Anticipated discharge in approximately 1-2 day(s).    Emergency Contact: Contact Information   Name Relation Home Work [id number] [phone]    The patient does not have a current PCP (No Pcp Per Patient) and [facility].  Signed: JaMichail JewelsMD PGY-1, Internal Medicine  10/11/2013, 4:46 AM

## 2013-10-11 NOTE — Progress Notes (Signed)
I have seen and examined the pt and agree with NP-Reibock's progress note. Wounds look c/d/i after ED cared for them No purulence and no surgical tx needed Con't dressing changes and can f/u with [place] as scheduled.

## 2013-10-11 NOTE — ED Notes (Signed)
EDP made aware that pt is feeling depressed.

## 2013-10-11 NOTE — Progress Notes (Signed)
INITIAL NUTRITION ASSESSMENT  DOCUMENTATION CODES Per approved criteria  -Severe malnutrition in the context of chronic illness -Underweight   INTERVENTION: Ensure Complete po BID, each supplement provides 350 kcal and 13 grams of protein Prostat liquid protein po BID with meals, each supplement provides 100 kcal, 15 grams protein RD to follow for nutrition care plan  NUTRITION DIAGNOSIS: Malnutrition related to chronic illness as evidenced by severe fat and muscle wasting  Goal: Pt to meet >/= 90% of their estimated nutrition needs   Monitor:  PO & supplemental intake, weight, labs, I/O's  Reason for Assessment: Consult  53 y.o. female  Admitting Dx: Vasculitic leg ulcer  ASSESSMENT: Patient with PMH of vasculitis and cocaine abuse; patient is homeless; recently discharged in December 2014; patient re-admitted with recurrent necrosis of a vasculitic wound.  Patient known to Clinical Nutrition with recent hospital admission in December 2014; patient with hx of decreased appetite; PO intake 100% for lunch per flowsheet record; CWOCN note reviewed 1/7 -- patient with 2 areas of full thickness wounds to L leg; RD to order Ensure Complete & liquid protein supplements.  Nutrition Focused Physical Exam:   Subcutaneous Fat:  Orbital Region: severe wasting  Upper Arm Region: severe wasting  Thoracic and Lumbar Region: severe wasting   Muscle:  Temple Region: severe wasting  Clavicle Bone Region: severe wasting  Clavicle and Acromion Bone Region: severe wasting  Scapular Bone Region: severe wasting  Dorsal Hand: severe wasting  Patellar Region: severe wasting  Anterior Thigh Region: severe wasting  Posterior Calf Region: severe wasting  Patient continues to meet criteria for severe malnutrition in the context of chronic illness as evidenced by severe muscle loss and severe subcutaneous fat loss.  Height: Ht Readings from Last 1 Encounters:  10/11/13 5' 2"  (1.575 m)     Weight: Wt Readings from Last 1 Encounters:  10/11/13 88 lb 10 oz (40.2 kg)    Ideal Body Weight: 50 kg  % Ideal Body Weight: 80%  Wt Readings from Last 10 Encounters:  10/11/13 88 lb 10 oz (40.2 kg)  09/27/13 101 lb 6.6 oz (46 kg)  09/27/13 101 lb 6.6 oz (46 kg)  08/31/13 95 lb (43.092 kg)  06/23/13 94 lb (42.638 kg)  06/19/13 92 lb 8 oz (41.958 kg)  06/17/13 102 lb (46.267 kg)  06/14/13 101 lb (45.813 kg)  05/14/13 94 lb (42.638 kg)  05/13/13 99 lb (44.906 kg)    Usual Body Weight: 94-103%  % Usual Body Weight: 85-93%  BMI:  Body mass index is 16.21 kg/(m^2).  Estimated Nutritional Needs: Kcal: 1500-1700 Protein: 75-85 gm Fluid: >/= 1.5 L  Skin: chronic full thickness wounds to BLE  Diet Order: General  EDUCATION NEEDS: -No education needs identified at this time   Intake/Output Summary (Last 24 hours) at 10/11/13 1609 Last data filed at 10/11/13 1410  Gross per 24 hour  Intake    240 ml  Output    200 ml  Net     40 ml    Labs:   Recent Labs Lab 10/11/13 0038 10/11/13 0520  NA 141 143  K 3.0* 3.4*  CL 105 106  CO2  --  25  BUN 11 13  CREATININE 0.90 0.83  CALCIUM  --  8.7  MG  --  1.8  GLUCOSE 123* 83    Scheduled Meds: . amantadine  100 mg Oral BID  . buPROPion  100 mg Oral BID  . docusate sodium  100 mg Oral BID  .  heparin  5,000 Units Subcutaneous Q8H  . sodium chloride  3 mL Intravenous Q12H    Continuous Infusions:   Past Medical History  Diagnosis Date  . 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 Followed by Dr. Louanne Skye  . Hypertension   . Cocaine abuse     ongoing with resultant vaculitis.  . Tobacco abuse   . Depression   . Normocytic anemia     BL Hgb 9.8-12. Last anemia panel 04/2010 - showing Fe 19, ferritin 101.  Pt on monthly B12 injections  . Rheumatoid arthritis(714.0)     patient reported  . Inflammatory arthritis   . LWUZRVUF(414.4)     Past Surgical History  Procedure Laterality Date  . Skin biopsy       bilateral shin nodules on 04/2010  . Hernia repair    . Irrigation and debridement abscess Bilateral 09/26/2013    Procedure: DEBRIDEMENT ULCERS BILATERAL THIGHS;  Surgeon: Gwenyth Ober, MD;  Location: [place];  Service: General;  Laterality: Bilateral;    Arthur Holms, RD, LDN Pager #: [phone] After-Hours Pager #: [phone]

## 2013-10-11 NOTE — ED Provider Notes (Signed)
CSN: [id number]     Arrival date & time 10/10/13  2135 History   First MD Initiated Contact with Patient 10/11/13 0035     Chief Complaint  Patient presents with  . Generalized Body Aches  . Leg Pain   (Consider location/radiation/quality/duration/timing/severity/associated sxs/prior Treatment) Patient is a 53 y.o. female presenting with leg pain. The history is provided by the patient.  Leg Pain Location:  Leg Leg location:  L leg Pain details:    Quality:  Aching   Radiates to:  Does not radiate   Severity:  Severe   Onset quality:  Gradual   Timing:  Constant   Progression:  Unchanged Chronicity:  New Dislocation: no   Foreign body present:  No foreign bodies Tetanus status:  Up to date Relieved by:  Nothing Worsened by:  Nothing tried Associated symptoms: no back pain, no fever and no swelling   Risk factors: no concern for non-accidental trauma     Past Medical History  Diagnosis Date  . 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 Followed by Dr. Louanne Skye  . Hypertension   . Cocaine abuse     ongoing with resultant vaculitis.  . Tobacco abuse   . Depression   . Normocytic anemia     BL Hgb 9.8-12. Last anemia panel 04/2010 - showing Fe 19, ferritin 101.  Pt on monthly B12 injections  . Rheumatoid arthritis(714.0)     patient reported  . Inflammatory arthritis    Past Surgical History  Procedure Laterality Date  . Skin biopsy      bilateral shin nodules on 04/2010  . Hernia repair    . Irrigation and debridement abscess Bilateral 09/26/2013    Procedure: DEBRIDEMENT ULCERS BILATERAL THIGHS;  Surgeon: Gwenyth Ober, MD;  Location: [facility] OR;  Service: General;  Laterality: Bilateral;   Family History  Problem Relation Age of Onset  . Breast cancer Mother     Breast cancer  . Alcohol abuse Mother   . Colon cancer Maternal Aunt 55  . Alcohol abuse Father    History  Substance Use Topics  . Smoking status: Current Every Day Smoker -- 0.50 packs/day for 39 years   Types: Cigarettes  . Smokeless tobacco: Never Used     Comment: trying  . Alcohol Use: No   OB History   Grav Para Term Preterm Abortions TAB SAB Ect Mult Living                 Review of Systems  Constitutional: Negative for fever.  Musculoskeletal: Negative for back pain.  Skin: Positive for wound.  All other systems reviewed and are negative.    Allergies  Acetaminophen  Home Medications   Current Outpatient Rx  Name  Route  Sig  Dispense  Refill  . amantadine (SYMMETREL) 100 MG capsule   Oral   Take 1 capsule (100 mg total) by mouth 2 (two) times daily. For cravings of cocaine.   60 capsule   0   . buPROPion (WELLBUTRIN) 100 MG tablet   Oral   Take 1 tablet (100 mg total) by mouth 2 (two) times daily. For depression related to cocaine use.   60 tablet   0   . traZODone (DESYREL) 50 MG tablet      Take one tablet at bedtime for insomnia.   15 tablet   0    BP 136/80  Pulse 83  Temp(Src) 99.1 F (37.3 C) (Oral)  Resp 18  Wt 100  lb 7 oz (45.558 kg)  SpO2 100% Physical Exam  Constitutional: She is oriented to person, place, and time. She appears well-developed and well-nourished. No distress.  HENT:  Head: Normocephalic and atraumatic.  Mouth/Throat: Oropharynx is clear and moist.  Eyes: Conjunctivae are normal. Pupils are equal, round, and reactive to light.  Neck: Normal range of motion. Neck supple.  Cardiovascular: Normal rate, regular rhythm and intact distal pulses.   Pulmonary/Chest: Effort normal and breath sounds normal. No stridor. She has no wheezes. She has no rales.  Abdominal: Soft. Bowel sounds are normal. There is no tenderness. There is no rebound and no guarding.  Musculoskeletal: Normal range of motion. She exhibits tenderness. She exhibits no edema.  Neurological: She is alert and oriented to person, place, and time.  Skin: Skin is warm.     Psychiatric: She has a normal mood and affect.    ED Course  Procedures (including  critical care time) Labs Review Labs Reviewed  CBC WITH DIFFERENTIAL - Abnormal; Notable for the following:    WBC 3.4 (*)    RBC 3.49 (*)    Hemoglobin 10.4 (*)    HCT 30.0 (*)    Monocytes Relative 1 (*)    Monocytes Absolute 0.0 (*)    All other components within normal limits  CBC WITH DIFFERENTIAL - Abnormal; Notable for the following:    WBC 3.3 (*)    RBC 3.52 (*)    Hemoglobin 10.5 (*)    HCT 30.2 (*)    Neutro Abs 1.6 (*)    Lymphocytes Relative 49 (*)    Monocytes Relative 1 (*)    Monocytes Absolute 0.0 (*)    All other components within normal limits  POCT I-STAT, CHEM 8 - Abnormal; Notable for the following:    Potassium 3.0 (*)    Glucose, Bld 123 (*)    Hemoglobin 9.9 (*)    HCT 29.0 (*)    All other components within normal limits  CULTURE, BLOOD (ROUTINE X 2)  CULTURE, BLOOD (ROUTINE X 2)  URINE RAPID DRUG SCREEN (HOSP PERFORMED)   Imaging Review Dg Femur Left  10/11/2013   CLINICAL DATA:  Open sores near the knee.  Evaluate for infection  EXAM: LEFT FEMUR - 2 VIEW  COMPARISON:  09/24/2013  FINDINGS: There is a persistent ulcer along the anterior lower thigh. No subcutaneous emphysema. No radiodense foreign body. No changes of osteomyelitis.  Left hip osteoarthritis with marginal spurring.  IMPRESSION: No evidence of osteomyelitis or foreign body near the lower thigh ulcer.   Electronically Signed   By: Jorje Guild M.D.   On: 10/11/2013 01:24    EKG Interpretation   None       MDM  No diagnosis found.  Case d/w OPC and Dr. Grandville Silos of trauma ccs will see in consult.  Cultures ordered abx ordered   Quanta Robertshaw K Charlesia Canaday-Rasch, MD 10/11/13 0300

## 2013-10-11 NOTE — Progress Notes (Signed)
Subjective: Patient still having F/C and considerable pain to her b/l anterior thigh lesions.  Objective: Vital signs in last 24 hours: Filed Vitals:   10/11/13 0745 10/11/13 0800 10/11/13 0815 10/11/13 0844  BP: 136/83 141/78 125/67 131/45  Pulse: 67 67 62 67  Temp:      TempSrc:      Resp: 14 18 18 18   Weight:      SpO2: 100% 100% 100% 100%   Weight change:  No intake or output data in the 24 hours ending 10/11/13 1021  Physical Exam General: alert, cooperative, and in no apparent distress HEENT: NCAT, vision grossly intact  Neck: supple Lungs: clear to ascultation bilaterally, normal work of respiration, no wheezes, rales, ronchi Heart: regular rate and rhythm, no murmurs, gallops, or rubs Abdomen: soft, non-tender, non-distended, normal bowel sounds Extremities: warm, thin extremities bilaterally; multiple ulcerations to bilateral anterior thighs (1 on R leg and two on L leg) that are TTP and have beefy red granulation tissue throughout, no induration and no discharge; they are wrapped loosely with gauze Neurologic: alert & oriented X3, cranial nerves II-XII grossly intact, strength grossly intact, sensation intact to light touch  Lab Results: Basic Metabolic Panel:  Recent Labs Lab 10/11/13 0038 10/11/13 0520  NA 141 143  K 3.0* 3.4*  CL 105 106  CO2  --  25  GLUCOSE 123* 83  BUN 11 13  CREATININE 0.90 0.83  CALCIUM  --  8.7  MG  --  1.8   Liver Function Tests:  Recent Labs Lab 10/11/13 0520  AST 14  ALT 8  ALKPHOS 71  BILITOT <0.2*  PROT 6.4  ALBUMIN 2.9*   CBC:  Recent Labs Lab 10/11/13 0034 10/11/13 0038 10/11/13 0145  WBC 3.4*  --  3.3*  NEUTROABS 1.7  --  1.6*  HGB 10.4* 9.9* 10.5*  HCT 30.0* 29.0* 30.2*  MCV 86.0  --  85.8  PLT 250  --  243   Urine Drug Screen: Drugs of Abuse     Component Value Date/Time   LABOPIA NONE DETECTED 10/11/2013 0302   LABOPIA PPS 08/31/2012 1045   COCAINSCRNUR POSITIVE* 10/11/2013 0302   COCAINSCRNUR  NEG 08/31/2012 1045   LABBENZ NONE DETECTED 10/11/2013 0302   LABBENZ NEG 08/31/2012 1045   LABBENZ NEGATIVE 06/12/2011 0221   AMPHETMU NONE DETECTED 10/11/2013 0302   AMPHETMU NEGATIVE 06/12/2011 0221   THCU NONE DETECTED 10/11/2013 0302   THCU 63.4* 05/20/2012 1405   LABBARB NONE DETECTED 10/11/2013 0302   LABBARB NEG 08/31/2012 1045    Urinalysis:  Recent Labs Lab 10/11/13 0302  COLORURINE YELLOW  LABSPEC 1.015  PHURINE 6.0  GLUCOSEU NEGATIVE  HGBUR TRACE*  BILIRUBINUR NEGATIVE  KETONESUR NEGATIVE  PROTEINUR NEGATIVE  UROBILINOGEN 0.2  NITRITE NEGATIVE  LEUKOCYTESUR SMALL*   Micro Results: No results found for this or any previous visit (from the past 240 hour(s)). Studies/Results: Dg Femur Left  10/11/2013   CLINICAL DATA:  Open sores near the knee.  Evaluate for infection  EXAM: LEFT FEMUR - 2 VIEW  COMPARISON:  09/24/2013  FINDINGS: There is a persistent ulcer along the anterior lower thigh. No subcutaneous emphysema. No radiodense foreign body. No changes of osteomyelitis.  Left hip osteoarthritis with marginal spurring.  IMPRESSION: No evidence of osteomyelitis or foreign body near the lower thigh ulcer.   Electronically Signed   By: Jorje Guild M.D.   On: 10/11/2013 01:24   Medications: I have reviewed the patient's current medications. Scheduled  Meds: . amantadine  100 mg Oral BID  . buPROPion  100 mg Oral BID  . docusate sodium  100 mg Oral BID  . heparin  5,000 Units Subcutaneous Q8H  . sodium chloride  3 mL Intravenous Q12H   Continuous Infusions: . sodium chloride    . piperacillin-tazobactam (ZOSYN)  IV    . vancomycin     PRN Meds:.sodium chloride, morphine injection, ondansetron (ZOFRAN) IV, ondansetron, oxyCODONE, sodium chloride, traZODone Assessment/Plan:  Levimasole vasculitis- Patient's skin lesions do not appear infected and xray without signs of osteomyelitis. Afebrile and VSS. She does have subjective F/C though this may be explained by her concurrent  URI. Surgery was consulted and they do not see any sign of infection and have signed off at this time. Pt has been started on vanc/zosyn, which we will discontinue given no suspected infection at this time. Patient is homeless, and a big issue is ensuring that patient will have adequate wound care upon discharge. Social work has set up placement for patient at [facility], [facility]. Patient is cocaine + and needs to stop using.  -wound care consult -at least once daily dressing changes -last wound culture grew proteus and enterococcus, will not repeat wound culture as she has received abx and the wound has been cleaned and w/o discharge at this time [facility] pending -morphine IV prn and oxycodone PO prn pain -plan to monitor VS overnight and if she remains afebrile, will plan for d/c to SNF tomorrow  URI- Likely viral in origin, no concern for PNA at this time as lungs are clear. I suspect patient's subjective F/C are more likely 2/2 to a viral syndrome.   Diet- regular  Code- full  Dispo: Discharge likely tomorrow  The patient does not have a current PCP (No Pcp Per Patient) and does need an [facility] hospital follow-up appointment after discharge.  The patient does not have transportation limitations that hinder transportation to clinic appointments.  .Services Needed at time of discharge: Y = Yes, Blank = No PT:   OT:   RN:   Equipment:   Other:     LOS: 1 day   Rebecca Eaton, MD 10/11/2013, 10:21 AM

## 2013-10-11 NOTE — Progress Notes (Signed)
ANTIBIOTIC CONSULT NOTE - INITIAL  Pharmacy Consult for Vancomycin/Zosyn  Indication: Cellulitis  Allergies  Allergen Reactions  . Acetaminophen Swelling    REACTION: eyelid swelling    Patient Measurements: Weight: 100 lb 7 oz (45.558 kg)  Vital Signs: Temp: 98.2 F (36.8 C) (01/07 0307) Temp src: Oral (01/07 0307) BP: 118/71 mmHg (01/07 0330) Pulse Rate: 66 (01/07 0330)  Labs:  Recent Labs  10/11/13 0034 10/11/13 0038 10/11/13 0145  WBC 3.4*  --  3.3*  HGB 10.4* 9.9* 10.5*  PLT 250  --  243  CREATININE  --  0.90  --    Microbiology: Recent Results (from the past 720 hour(s))  CULTURE, BLOOD (ROUTINE X 2)     Status: None   Collection Time    09/24/13  1:52 PM      Result Value Range Status   Specimen Description BLOOD RIGHT ARM   Final   Special Requests BOTTLES DRAWN AEROBIC AND ANAEROBIC 5CC   Final   Culture  Setup Time     Final   Value: 09/24/2013 17:55     Performed at [facility]   Culture     Final   Value: NO GROWTH 5 DAYS     Performed at [facility]   Report Status 09/30/2013 FINAL   Final  URINE CULTURE     Status: None   Collection Time    09/24/13  1:58 PM      Result Value Range Status   Specimen Description URINE, CATHETERIZED   Final   Special Requests NONE   Final   Culture  Setup Time     Final   Value: 09/24/2013 23:11     Performed at [place]     Final   Value: NO GROWTH     Performed at [facility]   Culture     Final   Value: NO GROWTH     Performed at [facility]   Report Status 09/25/2013 FINAL   Final  CULTURE, BLOOD (ROUTINE X 2)     Status: None   Collection Time    09/24/13  1:59 PM      Result Value Range Status   Specimen Description BLOOD LEFT ARM   Final   Special Requests BOTTLES DRAWN AEROBIC AND ANAEROBIC 4CC   Final   Culture  Setup Time     Final   Value: 09/24/2013 17:55     Performed at [facility]   Culture     Final   Value: NO  GROWTH 5 DAYS     Performed at [facility]   Report Status 09/30/2013 FINAL   Final  WOUND CULTURE     Status: None   Collection Time    09/24/13  2:05 PM      Result Value Range Status   Specimen Description LEG LOWER WOUND   Final   Special Requests Immunocompromised   Final   Gram Stain     Final   Value: ABUNDANT WBC PRESENT,BOTH PMN AND MONONUCLEAR     NO SQUAMOUS EPITHELIAL CELLS SEEN     ABUNDANT GRAM NEGATIVE RODS     ABUNDANT GRAM POSITIVE COCCI IN PAIRS     Performed at [facility]   Culture     Final   Value: ABUNDANT PROTEUS MIRABILIS     Performed at [facility]   Report Status 09/28/2013 FINAL   Final  Organism ID, Bacteria PROTEUS MIRABILIS   Final  WOUND CULTURE     Status: None   Collection Time    09/24/13  2:22 PM      Result Value Range Status   Specimen Description WOUND UPPER WOUND.   Final   Special Requests Immunocompromised   Final   Gram Stain     Final   Value: ABUNDANT WBC PRESENT,BOTH PMN AND MONONUCLEAR     NO SQUAMOUS EPITHELIAL CELLS SEEN     ABUNDANT GRAM NEGATIVE RODS     ABUNDANT GRAM POSITIVE COCCI IN PAIRS     FEW GRAM POSITIVE RODS   Culture     Final   Value: ABUNDANT PROTEUS MIRABILIS     Performed at [facility]   Report Status 09/27/2013 FINAL   Final   Organism ID, Bacteria PROTEUS MIRABILIS   Final  MRSA PCR SCREENING     Status: None   Collection Time    09/24/13  7:51 PM      Result Value Range Status   MRSA by PCR NEGATIVE  NEGATIVE Final   Comment:            The GeneXpert MRSA Assay (FDA     approved for NASAL specimens     only), is one component of a     comprehensive MRSA colonization     surveillance program. It is not     intended to diagnose MRSA     infection nor to guide or     monitor treatment for     MRSA infections.  WOUND CULTURE     Status: None   Collection Time    09/26/13  6:02 PM      Result Value Range Status   Specimen Description WOUND LEFT LEG   Final    Special Requests NONE   Final   Gram Stain     Final   Value: FEW WBC PRESENT, PREDOMINANTLY PMN     NO SQUAMOUS EPITHELIAL CELLS SEEN     ABUNDANT GRAM NEGATIVE RODS     ABUNDANT GRAM POSITIVE COCCI     IN PAIRS   Culture     Final   Value: MODERATE PROTEUS MIRABILIS     MODERATE ENTEROCOCCUS SPECIES     Performed at [facility]   Report Status 09/30/2013 FINAL   Final   Organism ID, Bacteria PROTEUS MIRABILIS   Final   Organism ID, Bacteria ENTEROCOCCUS SPECIES   Final  ANAEROBIC CULTURE     Status: None   Collection Time    09/26/13  6:02 PM      Result Value Range Status   Specimen Description WOUND LEFT LEG   Final   Special Requests NONE   Final   Gram Stain     Final   Value: RARE WBC PRESENT, PREDOMINANTLY PMN     NO SQUAMOUS EPITHELIAL CELLS SEEN     ABUNDANT GRAM NEGATIVE RODS     ABUNDANT GRAM POSITIVE COCCI     IN PAIRS   Culture     Final   Value: NO ANAEROBES ISOLATED     Performed at [facility]   Report Status 10/01/2013 FINAL   Final    Medical History: Past Medical History  Diagnosis Date  . 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 Followed by Dr. Louanne Skye  . Hypertension   . Cocaine abuse     ongoing with resultant vaculitis.  . Tobacco abuse   .  Depression   . Normocytic anemia     BL Hgb 9.8-12. Last anemia panel 04/2010 - showing Fe 19, ferritin 101.  Pt on monthly B12 injections  . Rheumatoid arthritis(714.0)     patient reported  . 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 WBC 3.3, afebrile, other labs as above.   ED Antibiotics  Vancomycin 1000 mg IV x 1 at 0305 Zosyn 3.375g IV x 1 at 0203  Goal of Therapy:  Vancomycin trough level 10-15 mcg/ml  Plan:  -Vancomycin 500 mg IV q12h -Zosyn 3.375G IV q8h to be infused over 4 hours -Trend WBC, temp, renal function  -F/U any cultures, imaging -Drug levels as  indicated    Thank you for allowing me to take part in this patient's care,  Narda Bonds, PharmD Clinical Pharmacist Phone: [phone] Pager: [phone] 10/11/2013 4:57 AM

## 2013-10-11 NOTE — Progress Notes (Signed)
Patient provided with a resource sheet for the [facility] and provided her consent for this CM to contact the Clinic to help her establish a PCP. Social Worker is helping to transfer patient to [facility] from ED.No further CM needs.

## 2013-10-11 NOTE — Clinical Social Work Note (Signed)
CSW consult with pt for possible SNF placement and/or home health care. CM, [place] at bedside. CSW spoke with pt regarding placement and the benefits of care at [facility].  Pt. requires wound care and has been not receiving care although a referral was made to [place]. Pt consented to send out FL2 to facilities in the [place] area. FL2 completed and sent out.  Pt has been admitted to hospital will likely be discharged tomorrow according to Dr. Mechele Claude. CSW spoke with pt regarding polysubstance, pt stated she has not used in 2 days.Resources for SA given. Pt. stated she was tired of using drugs and to teared up. CSW shared with pt. that we will keep her posted with plan of care.    [address] [place]

## 2013-10-11 NOTE — ED Notes (Signed)
WOC RN at bedside

## 2013-10-11 NOTE — H&P (Signed)
  Date: 10/11/2013  Patient name: Jessly Lebeck  Medical record number: [id number]  Date of birth: Sep 17, 1961   I have seen and evaluated Karolee Stamps and discussed their care with the Residency Team.   Briefly Ms. Crepeau is a 53yo woman, well known to the teaching service, who has a history of recurrent levamisole related vasculitic wounds.  She was last seen in December for wounds on her bilateral LE (thighs).  She returns as these same wound are non healing.  She does report subjective fever at home along with cough and rhinorrhea.  She further reports foul smelling fluid coming from the wounds.  She is currently homeless and was discharged to a friend's house with plans for home health nursing, but this did not happen.  She last used cocaine 3 days ago.  She was only able to change the dressing once daily.  Left femur xray revealed no osteomyelitis signs.   Assessment and Plan: I have seen and evaluated the patient as outlined above. I agree with the formulated Assessment and Plan as detailed in the residents' admission note, with the following changes:   1. Non healing wounds to bilateral thighs due to levamisole induced vasculitis.  - She was initially started on Abx, however, the wounds do not appear acutely infected at this time and in fact appear to be healing.  Will d/c Abx at this time and monitor for any worsening or acute sign of infection, pus, etc.  Wound care consult placed.  Surgery consult placed by ED.  Good wound care should be the best therapy and avoidance of any further cocaine.  BC are pending.  If any change in VS or + blood cultures, would restart Abx.   2. URI Sx, concern for flu: Flu PCR pending.  Symptomatic therapy.   Other issues per resident note.   Sid Falcon, MD 1/7/20151:28 PM

## 2013-10-11 NOTE — ED Notes (Signed)
Pt transported to xray 

## 2013-10-12 ENCOUNTER — Encounter: Payer: Self-pay | Admitting: Internal Medicine

## 2013-10-12 DIAGNOSIS — L299 Pruritus, unspecified: Secondary | ICD-10-CM

## 2013-10-12 DIAGNOSIS — I776 Arteritis, unspecified: Secondary | ICD-10-CM

## 2013-10-12 DIAGNOSIS — J069 Acute upper respiratory infection, unspecified: Secondary | ICD-10-CM

## 2013-10-12 LAB — BASIC METABOLIC PANEL
BUN: 10 mg/dL (ref 6–23)
CHLORIDE: 99 meq/L (ref 96–112)
CO2: 24 meq/L (ref 19–32)
Calcium: 8.5 mg/dL (ref 8.4–10.5)
Creatinine, Ser: 0.74 mg/dL (ref 0.50–1.10)
GFR calc Af Amer: >90 mL/min (ref >90)
GFR calc non Af Amer: >90 mL/min (ref >90)
GLUCOSE: 120 mg/dL — AB (ref 70–99)
Potassium: 3.8 mEq/L (ref 3.7–5.3)
SODIUM: 135 meq/L — AB (ref 137–147)

## 2013-10-12 LAB — URINE CULTURE

## 2013-10-12 MED ORDER — DIPHENHYDRAMINE HCL 25 MG PO CAPS
25.0000 mg | ORAL_CAPSULE | Freq: Once | ORAL | Status: AC
  Administered 2013-10-12: 25 mg via ORAL
  Filled 2013-10-12: qty 1

## 2013-10-12 MED ORDER — OSELTAMIVIR PHOSPHATE 75 MG PO CAPS
75.0000 mg | ORAL_CAPSULE | Freq: Two times a day (BID) | ORAL | Status: DC
  Administered 2013-10-12 – 2013-10-14 (×5): 75 mg via ORAL
  Filled 2013-10-12 (×6): qty 1

## 2013-10-12 MED ORDER — POTASSIUM CHLORIDE CRYS ER 20 MEQ PO TBCR
40.0000 meq | EXTENDED_RELEASE_TABLET | Freq: Once | ORAL | Status: AC
  Administered 2013-10-12: 40 meq via ORAL
  Filled 2013-10-12: qty 2

## 2013-10-12 NOTE — Progress Notes (Signed)
Subjective: Patient with some chills overnight. She still has nonproductive cough, ST, rhinorrhea, though these symptoms are stable and seemingly mild. T max overnight 100.4. Patient does have some itching to R elbow and L forearm w/ visible rash. WC and surgery have both signed off stating there is no concern for infection. Nutrition consulted and recommended some dietary supplements.   Objective: Vital signs in last 24 hours: Filed Vitals:   10/11/13 1734 10/11/13 2052 10/12/13 0500 10/12/13 1015  BP: 118/73 148/75 128/70 94/56  Pulse: 125 115 105 86  Temp: 100.4 F (38 C) 100.1 F (37.8 C) 100.2 F (37.9 C) 98.1 F (36.7 C)  TempSrc: Oral Oral Oral Oral  Resp: 22 20 20 18   Height:  5' 2"  (1.575 m)    Weight:  98 lb 8 oz (44.679 kg)    SpO2: 100% 100% 100% 100%   Weight change: -11 lb 13 oz (-5.358 kg)  Intake/Output Summary (Last 24 hours) at 10/12/13 1135 Last data filed at 10/12/13 0942  Gross per 24 hour  Intake 1118.5 ml  Output    350 ml  Net  768.5 ml    Physical Exam General: alert, cooperative, and in no apparent distress HEENT: NCAT, vision grossly intact  Neck: supple Lungs: clear to ascultation bilaterally, normal work of respiration, no wheezes, rales, ronchi Heart: regular rate and rhythm, no murmurs, gallops, or rubs Abdomen: soft, non-tender, non-distended, normal bowel sounds Extremities: warm, thin extremities bilaterally; multiple ulcerations to bilateral anterior thighs (1 on R leg and two on L leg) that are TTP and have beefy red granulation tissue throughout, no induration and no discharge; they are wrapped loosely with gauze; there are flesh colored papules and blisters over R elbow and R forearm- both areas near tape- no discharge, crust, or TTP, though there are some excoriations over these areas Neurologic: alert & oriented X3, cranial nerves II-XII grossly intact, strength grossly intact, sensation intact to light touch  Lab Results: Basic  Metabolic Panel:  Recent Labs Lab 10/11/13 0520 10/12/13 0916  NA 143 PENDING  K 3.4* PENDING  CL 106 PENDING  CO2 25 24  GLUCOSE 83 120*  BUN 13 10  CREATININE 0.83 0.74  CALCIUM 8.7 8.5  MG 1.8  --    Liver Function Tests:  Recent Labs Lab 10/11/13 0520  AST 14  ALT 8  ALKPHOS 71  BILITOT <0.2*  PROT 6.4  ALBUMIN 2.9*   CBC:  Recent Labs Lab 10/11/13 0034 10/11/13 0038 10/11/13 0145  WBC 3.4*  --  3.3*  NEUTROABS 1.7  --  1.6*  HGB 10.4* 9.9* 10.5*  HCT 30.0* 29.0* 30.2*  MCV 86.0  --  85.8  PLT 250  --  243   Urine Drug Screen: Drugs of Abuse     Component Value Date/Time   LABOPIA NONE DETECTED 10/11/2013 0302   LABOPIA PPS 08/31/2012 1045   COCAINSCRNUR POSITIVE* 10/11/2013 0302   COCAINSCRNUR NEG 08/31/2012 1045   LABBENZ NONE DETECTED 10/11/2013 0302   LABBENZ NEG 08/31/2012 1045   LABBENZ NEGATIVE 06/12/2011 0221   AMPHETMU NONE DETECTED 10/11/2013 0302   AMPHETMU NEGATIVE 06/12/2011 0221   THCU NONE DETECTED 10/11/2013 0302   THCU 63.4* 05/20/2012 1405   LABBARB NONE DETECTED 10/11/2013 0302   LABBARB NEG 08/31/2012 1045    Urinalysis:  Recent Labs Lab 10/11/13 0302  COLORURINE YELLOW  LABSPEC 1.015  PHURINE 6.0  GLUCOSEU NEGATIVE  HGBUR TRACE*  [place]  NEGATIVE  UROBILINOGEN 0.2  NITRITE NEGATIVE  LEUKOCYTESUR SMALL*   Micro Results: Recent Results (from the past 240 hour(s))  CULTURE, BLOOD (ROUTINE X 2)     Status: None   Collection Time    10/11/13  1:35 AM      Result Value Range Status   Specimen Description BLOOD RIGHT ARM   Final   Special Requests BOTTLES DRAWN AEROBIC AND ANAEROBIC 10CC EACH   Final   Culture  Setup Time     Final   Value: 10/11/2013 09:16     Performed at [facility]   Culture     Final   Value:        BLOOD CULTURE RECEIVED NO GROWTH TO DATE CULTURE WILL BE HELD FOR 5 DAYS BEFORE ISSUING A FINAL NEGATIVE REPORT     Performed at [facility]    Report Status PENDING   Incomplete  CULTURE, BLOOD (ROUTINE X 2)     Status: None   Collection Time    10/11/13  1:45 AM      Result Value Range Status   Specimen Description BLOOD RIGHT HAND   Final   Special Requests BOTTLES DRAWN AEROBIC ONLY 10CC   Final   Culture  Setup Time     Final   Value: 10/11/2013 09:16     Performed at [facility]   Culture     Final   Value:        BLOOD CULTURE RECEIVED NO GROWTH TO DATE CULTURE WILL BE HELD FOR 5 DAYS BEFORE ISSUING A FINAL NEGATIVE REPORT     Performed at [facility]   Report Status PENDING   Incomplete  URINE CULTURE     Status: None   Collection Time    10/11/13  3:02 AM      Result Value Range Status   Specimen Description URINE, CLEAN CATCH   Final   Special Requests CX ADDED AT 0435 ON [id number]   Final   Culture  Setup Time     Final   Value: 10/11/2013 04:45     Performed at [facility]     Final   Value: 30,000 COLONIES/ML     Performed at [facility]   Culture     Final   Value: DIPHTHEROIDS(CORYNEBACTERIUM SPECIES)     Note: Standardized susceptibility testing for this organism is not available.     Performed at [facility]   Report Status 10/12/2013 FINAL   Final   Studies/Results: Dg Femur Left  10/11/2013   CLINICAL DATA:  Open sores near the knee.  Evaluate for infection  EXAM: LEFT FEMUR - 2 VIEW  COMPARISON:  09/24/2013  FINDINGS: There is a persistent ulcer along the anterior lower thigh. No subcutaneous emphysema. No radiodense foreign body. No changes of osteomyelitis.  Left hip osteoarthritis with marginal spurring.  IMPRESSION: No evidence of osteomyelitis or foreign body near the lower thigh ulcer.   Electronically Signed   By: Jorje Guild M.D.   On: 10/11/2013 01:24   Medications: I have reviewed the patient's current medications. Scheduled Meds: . amantadine  100 mg Oral BID  . buPROPion  100 mg Oral BID  . docusate sodium  100 mg Oral BID  .  feeding supplement (ENSURE COMPLETE)  237 mL Oral BID BM  . feeding supplement (PRO-STAT SUGAR FREE 64)  30 mL Oral BID WC  . heparin  5,000 Units Subcutaneous  Q8H  . oseltamivir  75 mg Oral BID  . 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 Patient had fever of 100.4 overnight- no tylenol given (she is allergic to tylenol). I think her URI is more likely to be the cause of this fever as her leg wounds appear stable and not infected. BCx NGTD. -working of SNF placement, FL2 is signed -continue dressing changes -no need for antibiotics now -oxycodone PO prn for pain  URI- Likely viral in origin, possible influenza. Will keep on droplet precautions and start tamiflu for now. No concern for PNA at this time as lungs are clear. I suspect patient's subjective F/C are more likely 2/2 to this viral syndrome.  -tamiflu 43m BID x 5 days (day 1/5 today)  Pruritic rash- Unclear etiology. Tape allergy vs levamisole induced skin rash. -benadryl for symptom control   Diet- regular  Code- full  Dispo: Discharge likely tomorrow  The patient does not have a current PCP (No Pcp Per Patient) and [facility].  The patient does not have transportation limitations that hinder transportation to clinic appointments.  .Services Needed at time of discharge: Y = Yes, Blank = No PT:   OT:   RN:   Equipment:   Other:     LOS: 2 days   RRebecca Eaton MD 10/12/2013, 11:35 AM

## 2013-10-12 NOTE — Clinical Social Work Note (Signed)
Patient needs short-term rehab at a skilled facility and clinicals sent out to facilities in [facility] on 1/7. To date, patient has not received any bed offers in [facility]. Clinicals transmitted to [facility], [facility] in [place], [place], [facility] in [place], [facility] in [place] and [place]. CSW contacted admissions directors at [facility] in [place] prior to sending clinicals and was advised that they can consider a patient with Medicaid. CSW followed up later today and they are still reviewing information.  CSW will follow-up with SNF's on 1/8 to determine if any will accept patient.  Terren Jandreau Givens, MSW, LCSW [phone]

## 2013-10-12 NOTE — Progress Notes (Addendum)
10/12/2013 9:44 AM  Noted upon assessment that patient has healed/scarred area to buttocks bilaterally and to her sacrum.  Pt denies ever having a wound to this area in the past.  No current skin breakdown is noted to the area.  Will continue to monitor patient. [name]   This is a late entry.  Pt also noted to have bumpy raised rash to elbows/arms bilaterally and to her back that pt is complaining of itching about. MD made aware by patient during rounds this am.  Order for benadryl obtained.  Will continue to monitor patient. [name]

## 2013-10-12 NOTE — Progress Notes (Signed)
10/12/2013 8:26 AM  Pt is a moderate fall risk (score 7) per protocol. I explained to the patient her fall risk score and necessary interventions based on her score.  She verbalized understanding, however, refused to put on red socks.  She did allow me to put a yellow arm band on her.  No bed alarm needed at this time.  Will continue to monitor. Princella Pellegrini

## 2013-10-12 NOTE — Progress Notes (Signed)
  Date: 10/12/2013  Patient name: [name]  Medical record number: [id number]  Date of birth: July 12, 1961   This patient has been seen and the plan of care was discussed with the house staff. Please see their note for complete details. I concur with their findings with the following additions/corrections:  Patient continues to have mild elevated in temperature.  Wounds do not appear infected.  Will treat empirically for influenza given time of year and classic symptoms with which patient presented (cough, fever, myalgia).  Discharge to SNF when available. She will need very close assistance for wound care.   Sid Falcon, MD 10/12/2013, 3:01 PM

## 2013-10-13 NOTE — Clinical Social Work Psychosocial (Addendum)
Clinical Social Work Department BRIEF PSYCHOSOCIAL ASSESSMENT 10/13/2013  Patient:  [name], [name]     Account Number:  [id number]     Admit date:  10/10/2013  Clinical Social Worker:  Frederico Hamman  Date/Time:  10/13/2013 04:22 AM  Referred by:  Physician  Date Referred:  10/11/2013 Referred for  SNF Placement   Other Referral:   Interview type:  Patient Other interview type:    PSYCHOSOCIAL DATA Living Status:  FRIEND(S) Admitted from facility:   Level of care:   Primary support name:  [name] Primary support relationship to patient:  FRIEND Degree of support available:   Level of support unknown. Friend is allowing patient to stay with her.    CURRENT CONCERNS Current Concerns  Post-Acute Placement   Other Concerns:    SOCIAL WORK ASSESSMENT / PLAN ED CSW talked with patient and began bed search and search was expanded by this CSW on 1/8. CSW has not received any bed offers and this information was relayed to MD.    CSW talked with patient 1/9 to update her on status of bed search. Ms. Bordenave was asked if she had anywhere to go and patient responded that she lives with a friend [name] and can return to her home. Ms. Aung reported that her friend is out of town until Sunday and she can return to her home at that time.   Assessment/plan status:  No Further Intervention Required Other assessment/ plan:   Information/referral to community resources:   None needed or requested at this time.    PATIENT'S/FAMILY'S RESPONSE TO PLAN OF CARE: Patient agreeable to going back to her friends home. Her one concern is home health following up with her as she reports that they did not come out to see her the last time she left the hospital. Patient thanked CSW for assistance in trying to locate placement.  CSW contacted resident and advised her that patient will need to discharge home as a SNF cannot be located.

## 2013-10-13 NOTE — Care Management Note (Signed)
   CARE MANAGEMENT NOTE 10/13/2013  Patient:  BREAWNA, MONTENEGRO   Account Number:  [id number]  Date Initiated:  10/13/2013  Documentation initiated by:  Jaydence Vanyo  Subjective/Objective Assessment:   Referral for d/c to home with [facility] services.     Action/Plan:   Per [facility] provider this pt upon last d/c did not return calls to arrange appoints and when [facility] visited address given by pt, no one was at the address. This CM and [facility] rep spoke with pt together and validated d/c address and phone numbers.   Anticipated DC Date:  10/15/2013   Anticipated DC Plan:  [place]         Choice offered to / List presented to:          [facility] arranged  HH-1 RN      [place].   Status of service:  Completed, signed off Medicare Important Message given?   (If response is "NO", the following Medicare IM given date fields will be blank) Date Medicare IM given:   Date Additional Medicare IM given:    Discharge Disposition:  [place]  Per UR Regulation:    If discussed at Long Length of Stay Meetings, dates discussed:    Comments:  10/13/13 Met with pt and [facility] rep re d/c needs. Pt was informed of [facility] attempts to reach her after last d/c and again validated  the pt address and phone number. Pt was also give [facility] numbers to call if she does not hear from [facility] RN. Plan is to d/c this pt on Sunday 10/15/2013 to home of a friend. MD notified of [place] being arranged. CRoyal RN MPH, case manager, [phone]

## 2013-10-13 NOTE — Progress Notes (Signed)
  Date: 10/13/2013  Patient name: [name]  Medical record number: [id number]  Date of birth: 27-Nov-1960   This patient has been seen and the plan of care was discussed with the house staff. Please see their note for complete details. I concur with their findings.   Sid Falcon, MD 10/13/2013, 5:12 PM

## 2013-10-13 NOTE — Progress Notes (Signed)
Subjective: Patient has no complaints this AM. Her URI symptoms are improving and her leg pain only occurs with palpation or manipulation of the wounds. Her rash that began yesterday on her arms is much improved.   Objective: Vital signs in last 24 hours: Filed Vitals:   10/12/13 1359 10/12/13 1700 10/12/13 2230 10/13/13 0545  BP: 113/62 119/60 121/73 117/82  Pulse: 96 92 92 82  Temp: 98.8 F (37.1 C) 99.4 F (37.4 C) 99.1 F (37.3 C) 98.8 F (37.1 C)  TempSrc: Oral Oral Oral Oral  Resp: 20 17 18 18   Height:   5' 2"  (1.575 m)   Weight:   97 lb 11.2 oz (44.316 kg)   SpO2: 96% 95% 100% 100%   Weight change: 9 lb 1.2 oz (4.116 kg)  Intake/Output Summary (Last 24 hours) at 10/13/13 0651 Last data filed at 10/12/13 2256  Gross per 24 hour  Intake    720 ml  Output    600 ml  Net    120 ml    Physical Exam General: alert, cooperative, and in no apparent distress HEENT: NCAT, vision grossly intact  Neck: supple Lungs: clear to ascultation bilaterally, normal work of respiration, no wheezes, rales, ronchi Heart: regular rate and rhythm, no murmurs, gallops, or rubs Abdomen: thin, soft, non-tender, non-distended, normal bowel sounds Extremities: warm, thin extremities bilaterally; multiple ulcerations to bilateral anterior thighs (1 on R leg and two on L leg) that are TTP and covered with dressing; there are flesh colored papules and blisters over R elbow and R forearm- much improved from yesterday Neurologic: alert & oriented X3, cranial nerves II-XII grossly intact, strength grossly intact, sensation intact to light touch  Lab Results: Basic Metabolic Panel:  Recent Labs Lab 10/11/13 0520 10/12/13 0916  NA 143 135*  K 3.4* 3.8  CL 106 99  CO2 25 24  GLUCOSE 83 120*  BUN 13 10  CREATININE 0.83 0.74  CALCIUM 8.7 8.5  MG 1.8  --    Liver Function Tests:  Recent Labs Lab 10/11/13 0520  AST 14  ALT 8  ALKPHOS 71  BILITOT <0.2*  PROT 6.4  ALBUMIN 2.9*    CBC:  Recent Labs Lab 10/11/13 0034 10/11/13 0038 10/11/13 0145  WBC 3.4*  --  3.3*  NEUTROABS 1.7  --  1.6*  HGB 10.4* 9.9* 10.5*  HCT 30.0* 29.0* 30.2*  MCV 86.0  --  85.8  PLT 250  --  243   Urine Drug Screen: Drugs of Abuse     Component Value Date/Time   LABOPIA NONE DETECTED 10/11/2013 0302   LABOPIA PPS 08/31/2012 1045   COCAINSCRNUR POSITIVE* 10/11/2013 0302   COCAINSCRNUR NEG 08/31/2012 1045   LABBENZ NONE DETECTED 10/11/2013 0302   LABBENZ NEG 08/31/2012 1045   LABBENZ NEGATIVE 06/12/2011 0221   AMPHETMU NONE DETECTED 10/11/2013 0302   AMPHETMU NEGATIVE 06/12/2011 0221   THCU NONE DETECTED 10/11/2013 0302   THCU 63.4* 05/20/2012 1405   LABBARB NONE DETECTED 10/11/2013 0302   LABBARB NEG 08/31/2012 1045    Urinalysis:  Recent Labs Lab 10/11/13 0302  COLORURINE YELLOW  LABSPEC 1.015  PHURINE 6.0  GLUCOSEU NEGATIVE  HGBUR TRACE*  BILIRUBINUR NEGATIVE  KETONESUR NEGATIVE  PROTEINUR NEGATIVE  UROBILINOGEN 0.2  NITRITE NEGATIVE  LEUKOCYTESUR SMALL*   Micro Results: Recent Results (from the past 240 hour(s))  CULTURE, BLOOD (ROUTINE X 2)     Status: None   Collection Time    10/11/13  1:35 AM  Result Value Range Status   Specimen Description BLOOD RIGHT ARM   Final   Special Requests BOTTLES DRAWN AEROBIC AND ANAEROBIC 10CC EACH   Final   Culture  Setup Time     Final   Value: 10/11/2013 09:16     Performed at [facility]   Culture     Final   Value:        BLOOD CULTURE RECEIVED NO GROWTH TO DATE CULTURE WILL BE HELD FOR 5 DAYS BEFORE ISSUING A FINAL NEGATIVE REPORT     Performed at [facility]   Report Status PENDING   Incomplete  CULTURE, BLOOD (ROUTINE X 2)     Status: None   Collection Time    10/11/13  1:45 AM      Result Value Range Status   Specimen Description BLOOD RIGHT HAND   Final   Special Requests BOTTLES DRAWN AEROBIC ONLY 10CC   Final   Culture  Setup Time     Final   Value: 10/11/2013 09:16     Performed at [facility]   Culture     Final   Value:        BLOOD CULTURE RECEIVED NO GROWTH TO DATE CULTURE WILL BE HELD FOR 5 DAYS BEFORE ISSUING A FINAL NEGATIVE REPORT     Performed at [facility]   Report Status PENDING   Incomplete  URINE CULTURE     Status: None   Collection Time    10/11/13  3:02 AM      Result Value Range Status   Specimen Description URINE, CLEAN CATCH   Final   Special Requests CX ADDED AT 0435 ON [id number]   Final   Culture  Setup Time     Final   Value: 10/11/2013 04:45     Performed at [facility]     Final   Value: 30,000 COLONIES/ML     Performed at [facility]   Culture     Final   Value: DIPHTHEROIDS(CORYNEBACTERIUM SPECIES)     Note: Standardized susceptibility testing for this organism is not available.     Performed at [facility]   Report Status 10/12/2013 FINAL   Final   Studies/Results: No results found. Medications: I have reviewed the patient's current medications. Scheduled Meds: . amantadine  100 mg Oral BID  . buPROPion  100 mg Oral BID  . docusate sodium  100 mg Oral BID  . feeding supplement (ENSURE COMPLETE)  237 mL Oral BID BM  . feeding supplement (PRO-STAT SUGAR FREE 64)  30 mL Oral BID WC  . heparin  5,000 Units Subcutaneous Q8H  . oseltamivir  75 mg Oral BID  . 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 Afebrile overnight. BCx NGTD. -working of SNF placement, FL2 is signed -continue dressing changes -no need for antibiotics now -oxycodone PO prn for pain  URI- Improving. Likely viral in origin, possible influenza. Will keep on droplet precautions and we are treating for presumed influenza w/ tamiflu. -tamiflu 26m BID x 5 days (day 2/5 today)  Pruritic rash- Much improved this AM.  Unclear etiology. Tape allergy vs levamisole induced skin rash. -benadryl for symptom control   Diet- regular  Code- full  Dispo: Discharge today if SNF placement possible  The patient  does not have a current PCP (No Pcp Per Patient) and does need an [facility] hospital follow-up appointment after discharge.  The patient does not have transportation limitations that hinder transportation to clinic appointments.  .Services Needed at time of discharge: Y = Yes, Blank = No PT:   OT:   RN:   Equipment:   Other:     LOS: 3 days   Rebecca Eaton, MD 10/13/2013, 6:51 AM

## 2013-10-14 LAB — BASIC METABOLIC PANEL
BUN: 22 mg/dL (ref 6–23)
CHLORIDE: 99 meq/L (ref 96–112)
CO2: 28 meq/L (ref 19–32)
Calcium: 9.8 mg/dL (ref 8.4–10.5)
Creatinine, Ser: 0.73 mg/dL (ref 0.50–1.10)
GFR calc Af Amer: >90 mL/min (ref >90)
GFR calc non Af Amer: >90 mL/min (ref >90)
GLUCOSE: 102 mg/dL — AB (ref 70–99)
POTASSIUM: 4.6 meq/L (ref 3.7–5.3)
SODIUM: 140 meq/L (ref 137–147)

## 2013-10-14 LAB — INFLUENZA PANEL BY PCR (TYPE A & B)
H1N1 flu by pcr: NOT DETECTED
INFLBPCR: NEGATIVE
Influenza A By PCR: NEGATIVE

## 2013-10-14 NOTE — Progress Notes (Signed)
Subjective: [name] was seen and examined at bedside this morning. She was tearful and reports pain on her ears at site of prior lesions.  She says her friend will be home tomorrow where she plans to live and will be going home likely tomorrow.    Objective: Vital signs in last 24 hours: Filed Vitals:   10/13/13 2136 10/14/13 0633 10/14/13 1030 10/14/13 1400  BP: 115/68 100/60 120/74 130/85  Pulse: 82 80 79 94  Temp: 98.4 F (36.9 C) 98 F (36.7 C) 98.1 F (36.7 C) 97.9 F (36.6 C)  TempSrc: Oral Oral Oral Oral  Resp: 18 18 18 18   Height: 5' 2"  (1.575 m)     Weight: 97 lb 11.2 oz (44.316 kg)     SpO2: 99% 100% 100% 98%   Weight change: 0 lb (0 kg)  Intake/Output Summary (Last 24 hours) at 10/14/13 1802 Last data filed at 10/14/13 0830  Gross per 24 hour  Intake    420 ml  Output      0 ml  Net    420 ml    Physical Exam General: tearful, painful distress due to ear pain, lying in bed HEENT: PERLA, vasculitic lesions on pinna of b/l ears, tender to palpation, darkened discoloration Lungs: clear to ascultation bilaterally Heart: RRR Abdomen: soft, non-tender, thing, non-distended, +bs Extremities: warm, thin extremities b/l, clean dry and intact dressings in place on b/l thighs Neurologic: alert & oriented X3  Lab Results: Basic Metabolic Panel:  Recent Labs Lab 10/11/13 0520 10/12/13 0916 10/14/13 0329  NA 143 135* 140  K 3.4* 3.8 4.6  CL 106 99 99  CO2 25 24 28   GLUCOSE 83 120* 102*  BUN 13 10 22   CREATININE 0.83 0.74 0.73  CALCIUM 8.7 8.5 9.8  MG 1.8  --   --    Liver Function Tests:  Recent Labs Lab 10/11/13 0520  AST 14  ALT 8  ALKPHOS 71  BILITOT <0.2*  PROT 6.4  ALBUMIN 2.9*   CBC:  Recent Labs Lab 10/11/13 0034 10/11/13 0038 10/11/13 0145  WBC 3.4*  --  3.3*  NEUTROABS 1.7  --  1.6*  HGB 10.4* 9.9* 10.5*  HCT 30.0* 29.0* 30.2*  MCV 86.0  --  85.8  PLT 250  --  243   Urine Drug Screen: Drugs of Abuse     Component Value  Date/Time   LABOPIA NONE DETECTED 10/11/2013 0302   LABOPIA PPS 08/31/2012 1045   COCAINSCRNUR POSITIVE* 10/11/2013 0302   COCAINSCRNUR NEG 08/31/2012 1045   LABBENZ NONE DETECTED 10/11/2013 0302   LABBENZ NEG 08/31/2012 1045   LABBENZ NEGATIVE 06/12/2011 0221   AMPHETMU NONE DETECTED 10/11/2013 0302   AMPHETMU NEGATIVE 06/12/2011 0221   THCU NONE DETECTED 10/11/2013 0302   THCU 63.4* 05/20/2012 1405   LABBARB NONE DETECTED 10/11/2013 0302   LABBARB NEG 08/31/2012 1045    Urinalysis:  Recent Labs Lab 10/11/13 0302  COLORURINE YELLOW  LABSPEC 1.015  PHURINE 6.0  GLUCOSEU NEGATIVE  HGBUR TRACE*  BILIRUBINUR NEGATIVE  KETONESUR NEGATIVE  PROTEINUR NEGATIVE  UROBILINOGEN 0.2  NITRITE NEGATIVE  LEUKOCYTESUR SMALL*   Micro Results: Recent Results (from the past 240 hour(s))  CULTURE, BLOOD (ROUTINE X 2)     Status: None   Collection Time    10/11/13  1:35 AM      Result Value Range Status   Specimen Description BLOOD RIGHT ARM   Final   Special Requests BOTTLES DRAWN AEROBIC AND ANAEROBIC  10CC EACH   Final   Culture  Setup Time     Final   Value: 10/11/2013 09:16     Performed at [facility]   Culture     Final   Value:        BLOOD CULTURE RECEIVED NO GROWTH TO DATE CULTURE WILL BE HELD FOR 5 DAYS BEFORE ISSUING A FINAL NEGATIVE REPORT     Performed at [facility]   Report Status PENDING   Incomplete  CULTURE, BLOOD (ROUTINE X 2)     Status: None   Collection Time    10/11/13  1:45 AM      Result Value Range Status   Specimen Description BLOOD RIGHT HAND   Final   Special Requests BOTTLES DRAWN AEROBIC ONLY 10CC   Final   Culture  Setup Time     Final   Value: 10/11/2013 09:16     Performed at [facility]   Culture     Final   Value:        BLOOD CULTURE RECEIVED NO GROWTH TO DATE CULTURE WILL BE HELD FOR 5 DAYS BEFORE ISSUING A FINAL NEGATIVE REPORT     Performed at [facility]   Report Status PENDING   Incomplete  URINE CULTURE     Status:  None   Collection Time    10/11/13  3:02 AM      Result Value Range Status   Specimen Description URINE, CLEAN CATCH   Final   Special Requests CX ADDED AT 0435 ON [id number]   Final   Culture  Setup Time     Final   Value: 10/11/2013 04:45     Performed at [place]     Final   Value: 30,000 COLONIES/ML     Performed at [facility]   Culture     Final   Value: DIPHTHEROIDS(CORYNEBACTERIUM SPECIES)     Note: Standardized susceptibility testing for this organism is not available.     Performed at [facility]   Report Status 10/12/2013 FINAL   Final   Medications: I have reviewed the patient's current medications. Scheduled Meds: . amantadine  100 mg Oral BID  . buPROPion  100 mg Oral BID  . docusate sodium  100 mg Oral BID  . feeding supplement (ENSURE COMPLETE)  237 mL Oral BID BM  . feeding supplement (PRO-STAT SUGAR FREE 64)  30 mL Oral BID WC  . heparin  5,000 Units Subcutaneous Q8H  . oseltamivir  75 mg Oral BID  . sodium chloride  3 mL Intravenous Q12H   Continuous Infusions:   PRN Meds:.sodium chloride, ondansetron (ZOFRAN) IV, ondansetron, oxyCODONE, sodium chloride, traZODone Assessment/Plan:  Levimasole vasculitis- Stable but painful. Dressings in place on b/l thighs, changed daily.  Do not appear infected so no antibiotics. Blood cultures with NGTD.  -will likely d/c home tomorrow with home health wound care follow up -continue dressing changes -oxycodone PO prn for pain  URI- Improving with tamiflu. Likely viral in origin, possible influenza.  -continue droplet precautions -tamiflu 24m BID x 5 days (day 3/5 today)  Pruritic rash--resolved.  Unclear etiology, ?contact dermatitis vs tape allergy vs levamisole induced skin rash. -benadryl for symptom control   Diet- regular Code- full DVT Ppx: Heparin Dispo: d/c home tomorrow with home health wound care  The patient does not have a current PCP (No Pcp Per Patient) and  [facility].  The patient does not have transportation limitations that hinder transportation to clinic appointments.  Services Needed at time of discharge: Y = Yes, Blank = No PT:   OT:   RN:   Equipment:   Other: Home health wound care    LOS: 4 days   Jerene Pitch, MD 10/14/2013, 6:02 PM

## 2013-10-15 DIAGNOSIS — F329 Major depressive disorder, single episode, unspecified: Secondary | ICD-10-CM

## 2013-10-15 DIAGNOSIS — F3289 Other specified depressive episodes: Secondary | ICD-10-CM

## 2013-10-15 NOTE — Progress Notes (Signed)
  Date: 10/15/2013  Patient name: [name]  Medical record number: [id number]  Date of birth: [birth year]   This patient has been seen and the plan of care was discussed with the house staff. Please see their note for complete details. I concur with their findings with the following additions/corrections:  Have reviewed notes about patient's psychological state.  Consult to Psychiatry placed.   Sid Falcon, MD 10/15/2013, 8:55 PM

## 2013-10-15 NOTE — Progress Notes (Signed)
   CARE MANAGEMENT NOTE 10/15/2013  Patient:  [name], [name]   Account Number:  [id number]  Date Initiated:  10/13/2013  Documentation initiated by:  ROYAL,CHERYL  Subjective/Objective Assessment:   Referral for d/c to home with [facility] services.     Action/Plan:   Per [facility] provider this pt upon last d/c did not return calls to arrange appoints and when [facility] visited address given by pt, no one was at the address. This CM and [facility] rep spoke with pt together and validated d/c address and phone numbers.   Anticipated DC Date:  10/15/2013   Anticipated DC Plan:  [place]         Choice offered to / List presented to:          [facility] arranged  HH-1 RN      [place].   Status of service:  Completed, signed off Medicare Important Message given?   (If response is "NO", the following Medicare IM given date fields will be blank) Date Medicare IM given:   Date Additional Medicare IM given:    Discharge Disposition:    Per UR Regulation:    If discussed at Long Length of Stay Meetings, dates discussed:    Comments:  10/15/13 10:00  CM called [facility] to verify contact numbers and [facility] stated multiple attempts were made to contact pt and is well documented including a call to pt's MD and calls to emergency contacts.  Pt crying and asking to see MD bc she states she cannot go back home.  CM received call from MD 12:00 to see if pt can be placed in an environment as she is not going to be accepted by a SNF or [place] agency at this time.  CM called CSW and reported aforementioned.  No other CM needs were communicated.  Mariane Masters, BSN, [place].  10/13/13 Met with pt and [facility] rep re d/c needs. Pt was informed of [facility] attempts to reach her after last d/c and again validated  the pt address and phone number. Pt was also give [facility] numbers to call if she does not hear from [facility] RN. Plan is to d/c this pt on Sunday 10/15/2013 to home of a friend. MD notified of [place] being  arranged. CRoyal RN MPH, case manager, [phone]

## 2013-10-15 NOTE — Progress Notes (Addendum)
Discuss with [name], Social worker patient stated, "that if she is discharge she will slit her wrist".  Patient very   tearful, stating she has not seen her family in 35 years and she want to get better.  Call placed to Dr. Eula Fried, patient placed on suicide precautions.

## 2013-10-15 NOTE — Progress Notes (Signed)
Subjective: Ms. Hunn was seen and examined at bedside this morning. She is very was tearful, says she does not feel safe leaving the hospital and going back to society. She says she knows she will use drugs again if that happens and that she has thoughts to hurt herself if she gets back in that situation.  She has no plans to hurt herself at this time it appears but she says she is very depressed and fearful.  She wishes to go back to behavioral health where she says she has had success before with the help she needs.   Objective: Vital signs in last 24 hours: Filed Vitals:   10/14/13 1813 10/14/13 2131 10/15/13 0605 10/15/13 1042  BP: 126/71 107/63 115/68 113/87  Pulse: 68 87 69 109  Temp: 98.1 F (36.7 C) 98.4 F (36.9 C) 98.3 F (36.8 C) 97.9 F (36.6 C)  TempSrc: Oral Oral Oral Oral  Resp: 18 18 18 18   Height:      Weight:  97 lb 11.2 oz (44.316 kg)    SpO2: 100% 100% 100% 95%   Weight change: 0 lb (0 kg)  Intake/Output Summary (Last 24 hours) at 10/15/13 1244 Last data filed at 10/15/13 0900  Gross per 24 hour  Intake    960 ml  Output      0 ml  Net    960 ml    Physical Exam General: tearful, NAD, sitting up in bed HEENT: PERLA, vasculitic lesions on pinna of b/l ears, tender to palpation, darkened discoloration Lungs: clear to ascultation bilaterally Heart: RRR Abdomen: soft, non-tender, thing, non-distended, +bs Extremities: warm, thin extremities b/l, clean dry and intact dressings in place on b/l thighs, wounds wet appearing but no oozing pus or surround erythema or edema. Darkened discoloration of toes with pain. Neurologic: alert & oriented X3  Lab Results: Basic Metabolic Panel:  Recent Labs Lab 10/11/13 0520 10/12/13 0916 10/14/13 0329  NA 143 135* 140  K 3.4* 3.8 4.6  CL 106 99 99  CO2 25 24 28   GLUCOSE 83 120* 102*  BUN 13 10 22   CREATININE 0.83 0.74 0.73  CALCIUM 8.7 8.5 9.8  MG 1.8  --   --    Liver Function Tests:  Recent Labs Lab  10/11/13 0520  AST 14  ALT 8  ALKPHOS 71  BILITOT <0.2*  PROT 6.4  ALBUMIN 2.9*   CBC:  Recent Labs Lab 10/11/13 0034 10/11/13 0038 10/11/13 0145  WBC 3.4*  --  3.3*  NEUTROABS 1.7  --  1.6*  HGB 10.4* 9.9* 10.5*  HCT 30.0* 29.0* 30.2*  MCV 86.0  --  85.8  PLT 250  --  243   Urine Drug Screen: Drugs of Abuse     Component Value Date/Time   LABOPIA NONE DETECTED 10/11/2013 0302   LABOPIA PPS 08/31/2012 1045   COCAINSCRNUR POSITIVE* 10/11/2013 0302   COCAINSCRNUR NEG 08/31/2012 1045   LABBENZ NONE DETECTED 10/11/2013 0302   LABBENZ NEG 08/31/2012 1045   LABBENZ NEGATIVE 06/12/2011 0221   AMPHETMU NONE DETECTED 10/11/2013 0302   AMPHETMU NEGATIVE 06/12/2011 0221   THCU NONE DETECTED 10/11/2013 0302   THCU 63.4* 05/20/2012 1405   LABBARB NONE DETECTED 10/11/2013 0302   LABBARB NEG 08/31/2012 1045    Urinalysis:  Recent Labs Lab 10/11/13 0302  COLORURINE YELLOW  LABSPEC 1.015  PHURINE 6.0  GLUCOSEU NEGATIVE  HGBUR TRACE*  BILIRUBINUR NEGATIVE  KETONESUR NEGATIVE  PROTEINUR NEGATIVE  UROBILINOGEN 0.2  NITRITE NEGATIVE  LEUKOCYTESUR SMALL*   Micro Results: Recent Results (from the past 240 hour(s))  CULTURE, BLOOD (ROUTINE X 2)     Status: None   Collection Time    10/11/13  1:35 AM      Result Value Range Status   Specimen Description BLOOD RIGHT ARM   Final   Special Requests BOTTLES DRAWN AEROBIC AND ANAEROBIC 10CC EACH   Final   Culture  Setup Time     Final   Value: 10/11/2013 09:16     Performed at [facility]   Culture     Final   Value:        BLOOD CULTURE RECEIVED NO GROWTH TO DATE CULTURE WILL BE HELD FOR 5 DAYS BEFORE ISSUING A FINAL NEGATIVE REPORT     Performed at [facility]   Report Status PENDING   Incomplete  CULTURE, BLOOD (ROUTINE X 2)     Status: None   Collection Time    10/11/13  1:45 AM      Result Value Range Status   Specimen Description BLOOD RIGHT HAND   Final   Special Requests BOTTLES DRAWN AEROBIC ONLY 10CC   Final    Culture  Setup Time     Final   Value: 10/11/2013 09:16     Performed at [facility]   Culture     Final   Value:        BLOOD CULTURE RECEIVED NO GROWTH TO DATE CULTURE WILL BE HELD FOR 5 DAYS BEFORE ISSUING A FINAL NEGATIVE REPORT     Performed at [facility]   Report Status PENDING   Incomplete  URINE CULTURE     Status: None   Collection Time    10/11/13  3:02 AM      Result Value Range Status   Specimen Description URINE, CLEAN CATCH   Final   Special Requests CX ADDED AT 0435 ON [id number]   Final   Culture  Setup Time     Final   Value: 10/11/2013 04:45     Performed at [place]     Final   Value: 30,000 COLONIES/ML     Performed at [facility]   Culture     Final   Value: DIPHTHEROIDS(CORYNEBACTERIUM SPECIES)     Note: Standardized susceptibility testing for this organism is not available.     Performed at [facility]   Report Status 10/12/2013 FINAL   Final   Medications: I have reviewed the patient's current medications. Scheduled Meds: . amantadine  100 mg Oral BID  . buPROPion  100 mg Oral BID  . docusate sodium  100 mg Oral BID  . feeding supplement (ENSURE COMPLETE)  237 mL Oral BID BM  . feeding supplement (PRO-STAT SUGAR FREE 64)  30 mL Oral BID WC  . heparin  5,000 Units Subcutaneous Q8H  . 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 Dressings in place on b/l thighs, changed daily by patient or nursing.  Do not appear infected so no antibiotics. Blood cultures with NGTD.  -was going to get home health wound care but does not feel safe going home at this time -continue dressing changes--simple silicone foam dressing with gel -oxycodone PO prn for pain  URI-resolved during admission  and tamiflu (3  days).  Flu pcr negative so tamiflu stopped by night team.    Depression with hx of substance abuse--very tearful, does not feel safe going back into society at this time. Feels like she will be tempted to use drugs again and does not want to fall in that cycle again. Those reasons make her feel like she would want to hurt herself but has no plans at this time. She wishes to go to behavioral health. We discussed cessation of cocaine in detail this admission, and she says she is trying but knows at this time would be tempted very easily but if she can get her depression and thoughts improved and also more time away from temptation she feels like she will do well.  Case discussed in detail with social work who reports no accepting SNF facilities. Discussed with CM again today, who can only provide list of resources that are limited and if any rehab facilities available would require her to check herself in.  At this time, given her depression and current complaints, but being medically stable, will consult psychiatry for further assistance and possible behavioral health placement. -consulted psychiatry today, hopeful they may evaluate patient today and appreciate their assistance -on wellbutrin bid 167m -on amantadine 104mbid to reduce cocaine cravings -continue trazodone 5070mhs prn sleep -appreciate social work and CM assistance  Diet- regular Code- full DVT Ppx: Heparin Dispo: medically stable at this time but says she does not feel safe going back into society at this time. Feels depressed and wishes for behavioral health assistance.   The patient does not have a current PCP (No Pcp Per Patient) and [facility].  The patient does not have transportation limitations that hinder transportation to clinic appointments.  Services Needed at time of discharge: Y = Yes, Blank = No PT:   OT:   RN:   Equipment:   Other: Home health  wound care    LOS: 5 days   SamJerene PitchD 10/15/2013, 12:44 PM

## 2013-10-15 NOTE — Clinical Social Work Note (Signed)
CSW met with patient at bedside to address her request to go to [facility]. CSW has reviewed MD notes. CSW was originally consulted for ALF placement. Patient wanted to return to [facility]" upon DC. Patient states that this is her friend. Patient states that if she leaves the hospital she will use and will harm herself. CSW asked if patient has current suicidal ideation, plans, means, and/or intent. Patient states that she does "not want to live". Patient states that she will "cut her wrists" if she is discharged from the hospital. She states that she is very likely to do this today if she is discharged. Patient states that she has had these thoughts several times in the past but has never acted on them. CSW inquired about patient's access to objects that could be used to harm herself. Patient states that she has many knives that she can use to cut herself. Patient states that she would voluntarily go to [facility] at this time. If MD believes this is appropriate, please consult psychiatry for evaluation. Weekend CSW has given patient list of outpatient SA resources and NA meeting schedule. RN notified of patient's suicidal ideation, plans, means, and intent. CSW will leave report for unit CSW.   Bryant , LCSWA, LCASA, [phone] 

## 2013-10-16 DIAGNOSIS — F1410 Cocaine abuse, uncomplicated: Secondary | ICD-10-CM

## 2013-10-16 DIAGNOSIS — F339 Major depressive disorder, recurrent, unspecified: Secondary | ICD-10-CM

## 2013-10-16 MED ORDER — MIRTAZAPINE 7.5 MG PO TABS
7.5000 mg | ORAL_TABLET | Freq: Every day | ORAL | Status: DC
  Administered 2013-10-16: 7.5 mg via ORAL
  Filled 2013-10-16 (×2): qty 1

## 2013-10-16 NOTE — Clinical Social Work Psych Assess (Signed)
Clinical Social Work Department CLINICAL SOCIAL WORK PSYCHIATRY SERVICE LINE ASSESSMENT 10/16/2013  Patient:  [name]  Account:  [id number]  [place] Date:  10/10/2013  Clinical Social Worker:  Kemper Durie, [place]  Date/Time:  10/15/2013 02:30 PM Referred by:  Physician  Date referred:  10/15/2013 Reason for Referral  Behavioral Health Issues   Presenting Symptoms/Problems (In the person's/family's own words):   Patient states "I have a problem with cocaine. If you discharge me from the hospital, I know that I will use again and will want to harm myself. I am scared to leave the hospital." Patient is very tearful.    Abuse/Neglect/Trauma Comments:   Not Assessed   Psychiatric History (check all that apply)  Inpatient/hospitilization   Psychiatric medications:  amantadine (SYMMETREL) capsule 100 mg  Dose: 100 mg Freq: 2 times daily Route: PO  Indications Comment: cocaine cravings  Start: 10/11/13 1000    buPROPion (WELLBUTRIN) tablet 100 mg  Dose: 100 mg Freq: 2 times daily Route: PO  Indications of Use: MAJOR DEPRESSIVE DISORDER  Start: 10/11/13 1000   Current Mental Health Hospitalizations/Previous Mental Health History:   Patient states that she has been at [facility] in the past for her cocaine use and states that she will voluntarily go to [facility] now.   Current provider:   Not Assessed   Place and Date:   Not Assessed   Current Medications:   Scheduled Meds:      . amantadine  100 mg Oral BID  . buPROPion  100 mg Oral BID  . docusate sodium  100 mg Oral BID  . feeding supplement (ENSURE COMPLETE)  237 mL Oral BID BM  . feeding supplement (PRO-STAT SUGAR FREE 64)  30 mL Oral BID WC  . heparin  5,000 Units Subcutaneous Q8H        Continuous Infusions:      PRN Meds:.ondansetron (ZOFRAN) IV, ondansetron, oxyCODONE, traZODone       Previous Impatient Admission/Date/Reason:   Vasculitic leg ulcer, suicidal ideation on 09/24/2013   Emotional Health / Current  Symptoms    Suicide/Self Harm  Has a plan for suicide  Suicidal ideation (ex: "I can't take any more,I wish I could disappear")   Suicide attempt in the past:   Patient states she has had suicidal ideation in the past, but denies ever acting on it.   Other harmful behavior:   Patient is a frequent cocaine user.   Psychotic/Dissociative Symptoms  None reported   Other Psychotic/Dissociative Symptoms:    Attention/Behavioral Symptoms  Within Normal Limits   Other Attention / Behavioral Symptoms:    Cognitive Impairment  Unable to accurately assess   Other Cognitive Impairment:    Mood and Adjustment  DEPRESSION  Anxious    Stress, Anxiety, Trauma, Any Recent Loss/Stressor  Anxiety  Grief/Loss (recent or history)  Relationship   Anxiety (frequency):   Phobia (specify):   Compulsive behavior (specify):   Obsessive behavior (specify):   Other:   Substance Abuse/Use  Current substance use  Substance abuse treatment needed   SBIRT completed (please refer for detailed history):  N  Self-reported substance use:   Urinary Drug Screen Completed:  Y Alcohol level:   <10    Environmental/Housing/Living Arrangement  With Other Relatives   Who is in the home:   She states that she lives with her friend [facility]   Emergency contact:  Cassie Freer [phone]   Financial  Medicaid   Patient's Strengths and Goals (patient's  own words):   Not Assessed   Clinical Social Worker's Interpretive Summary:   CSW met with patient at bedside to address her request to go to [facility]. CSW has reviewed MD notes. CSW was originally consulted for ALF placement. Patient wanted to return to [facility] upon DC. Patient states that this is her friend. Patient states that if she leaves the hospital she will use and will harm herself. CSW asked if patient has current suicidal ideation, plans, means, and/or intent. Patient states that she does "not want to live".  Patient states that she will "cut her wrists" if she is discharged from the hospital. She states that she is very likely to do this today if she is discharged. Patient states that she has had these thoughts several times in the past but has never acted on them. CSW inquired about patient's access to objects that could be used to harm herself. Patient states that she has many knives that she can use to cut herself. Patient states that she would voluntarily go to [facility] at this time. If MD believes this is appropriate, please consult psychiatry for evaluation. Weekend CSW has given patient list of outpatient SA resources and NA meeting schedule. RN notified of patient's suicidal ideation, plans, means, and intent. CSW will leave report for unit CSW.   Disposition:  Recommend Psych CSW continuing to support while in hospital   [address], [place], [place], [phone]

## 2013-10-16 NOTE — Progress Notes (Signed)
  Date: 10/16/2013  Patient name: [name]  Medical record number: [id number]  Date of birth: 1961-02-24   This patient has been seen and the plan of care was discussed with the house staff. Please see their note for complete details. I concur with their findings with the following additions/corrections:  Awaiting Psych consultation.   Sid Falcon, MD 10/16/2013, 1:45 PM

## 2013-10-16 NOTE — Consult Note (Signed)
Reason for Consult: Depression with suicidal thoughts and substance abuse Referring Physician: Jerene Pitch, MD   [name] is an 53 y.o. female.  HPI: patient is known to this provider from her previous 2 acute psychiatric hospitalization within the last 6 months for 4 cocaine abuse versus dependence and also substance induced mood disorder. Patient was admitted to the [facility] levamisole-induced skin necrosis, depression, normocytic anemia, and RA. Patient has been staying with their roommate/friend otherwise she was to homeless. She smokes 2-3 cigs/day for 42 years. She is "trying to" stay away from cocaine.   Mental Status Examination: Patient appeared as per his stated age, calm, cooperative and poorly groomed, and has good eye contact. Patient has depressed mood and his affect was dysphoric. He has normal rate, rhythm, and volume of speech. His thought process is linear and goal directed. Patient has suicidal suicide ideation but denies, homicidal ideations, intentions or plans. Patient has no evidence of auditory or visual hallucinations, delusions, and paranoia. Patient has poor insight judgment and impulse control.  Past Medical History  Diagnosis Date  . 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 Followed by Dr. Louanne Skye  . Hypertension   . Cocaine abuse     ongoing with resultant vaculitis.  . Tobacco abuse   . Depression   . Normocytic anemia     BL Hgb 9.8-12. Last anemia panel 04/2010 - showing Fe 19, ferritin 101.  Pt on monthly B12 injections  . Rheumatoid arthritis(714.0)     patient reported  . Inflammatory arthritis   . QBHALPFX(902.4)     Past Surgical History  Procedure Laterality Date  . Skin biopsy      bilateral shin nodules on 04/2010  . Hernia repair    . Irrigation and debridement abscess Bilateral 09/26/2013    Procedure: DEBRIDEMENT ULCERS BILATERAL THIGHS;  Surgeon: Gwenyth Ober, MD;  Location: [facility] OR;  Service: General;  Laterality:  Bilateral;    Family History  Problem Relation Age of Onset  . Breast cancer Mother     Breast cancer  . Alcohol abuse Mother   . Colon cancer Maternal Aunt 9  . Alcohol abuse Father     Social History:  reports that she has been smoking Cigarettes.  She has a 19.5 pack-year smoking history. She has never used smokeless tobacco. She reports that she uses illicit drugs ("Crack" cocaine, Cocaine, and Marijuana). She reports that she does not drink alcohol.  Allergies:  Allergies  Allergen Reactions  . Acetaminophen Swelling    REACTION: eyelid swelling    Medications: I have reviewed the patient's current medications.  No results found for this or any previous visit (from the past 48 hour(s)).  No results found.  Positive for anxiety, bad mood, depression, illegal drug usage and sleep disturbance Blood pressure 98/61, pulse 75, temperature 97.7 F (36.5 C), temperature source Oral, resp. rate 16, height 5' 2"  (1.575 m), weight 44.316 kg (97 lb 11.2 oz), SpO2 100.00%.   Assessment/Plan: Substance abuse (cocaine) Maj. depressive disorder recurrent  Recommendation: Recommended inpatient psychiatric hospitalization once medically stable Continue Wellbutrin and start Remeron 7.5 mg at bedtime Continue trazodone 50 mg at bedtime Appreciate psychiatric consultation May call [phone] if needed further assistance   Elliett Guarisco,JANARDHAHA R. 10/16/2013, 11:38 AM

## 2013-10-16 NOTE — Progress Notes (Signed)
Subjective: [name] feels fine this morning. She does not have pain to her legs unless she presses on them. She does have "black areas" on three of her toes on her L foot that came up "a few days ago" and are only painful if her foot becomes cold. She is depressed regarding her continued cocaine use and is fearful that she will use again if she goes home and that then she will end up in the hospital for these same skin issues again. She expressed to me that she might hurt herself if she went home today. She is amenable to speaking with the psychiatrist regarding her current depression and thoughts to hurt herself. Afebrile, and VSS overnight.    Objective: Vital signs in last 24 hours: Filed Vitals:   10/15/13 1042 10/15/13 1312 10/15/13 2100 10/16/13 0655  BP: 113/87 104/66 108/58 110/60  Pulse: 109 79 70 83  Temp: 97.9 F (36.6 C) 97.7 F (36.5 C) 97.5 F (36.4 C) 98.4 F (36.9 C)  TempSrc: Oral Oral Oral Oral  Resp: 18 16 16 16   Height:      Weight:      SpO2: 95% 100% 99% 100%   Weight change:   Intake/Output Summary (Last 24 hours) at 10/16/13 0738 Last data filed at 10/16/13 0656  Gross per 24 hour  Intake    720 ml  Output    100 ml  Net    620 ml    Physical Exam General: resting in bed, NAD; became tearful when discussing her cocaine use and desire to quit HEENT: PERLA, vasculitic lesions on pinna of b/l ears, tender to palpation, darkened discoloration Lungs: clear to ascultation bilaterally Heart: RRR Abdomen: soft, non-tender, thing, non-distended, +bs Extremities: warm, thin extremities b/l, R leg erosion is healing well with no oozing or drainage; the proximal L ulceration had some serosanguinous oozing, though no purulence or induration; the more distal and deeper ulceration on L leg has abundant granulation tissue and is healing well; dark macules to distal tip of L 2nd, 3rd, 4th toes, no drainage or skin breakdown Psych: depressed mood and was tearful this  AM, admits she may hurt herself if was sent back to her friend's house Neurologic: alert & oriented X3  Lab Results: Basic Metabolic Panel:  Recent Labs Lab 10/11/13 0520 10/12/13 0916 10/14/13 0329  NA 143 135* 140  K 3.4* 3.8 4.6  CL 106 99 99  CO2 25 24 28   GLUCOSE 83 120* 102*  BUN 13 10 22   CREATININE 0.83 0.74 0.73  CALCIUM 8.7 8.5 9.8  MG 1.8  --   --    Liver Function Tests:  Recent Labs Lab 10/11/13 0520  AST 14  ALT 8  ALKPHOS 71  BILITOT <0.2*  PROT 6.4  ALBUMIN 2.9*   CBC:  Recent Labs Lab 10/11/13 0034 10/11/13 0038 10/11/13 0145  WBC 3.4*  --  3.3*  NEUTROABS 1.7  --  1.6*  HGB 10.4* 9.9* 10.5*  HCT 30.0* 29.0* 30.2*  MCV 86.0  --  85.8  PLT 250  --  243   Urine Drug Screen: Drugs of Abuse     Component Value Date/Time   LABOPIA NONE DETECTED 10/11/2013 0302   LABOPIA PPS 08/31/2012 1045   COCAINSCRNUR POSITIVE* 10/11/2013 0302   COCAINSCRNUR NEG 08/31/2012 1045   LABBENZ NONE DETECTED 10/11/2013 0302   LABBENZ NEG 08/31/2012 1045   LABBENZ NEGATIVE 06/12/2011 0221   AMPHETMU NONE DETECTED 10/11/2013 0302  AMPHETMU NEGATIVE 06/12/2011 0221   THCU NONE DETECTED 10/11/2013 0302   THCU 63.4* 05/20/2012 1405   LABBARB NONE DETECTED 10/11/2013 0302   LABBARB NEG 08/31/2012 1045    Urinalysis:  Recent Labs Lab 10/11/13 0302  COLORURINE YELLOW  LABSPEC 1.015  PHURINE 6.0  GLUCOSEU NEGATIVE  HGBUR TRACE*  BILIRUBINUR NEGATIVE  KETONESUR NEGATIVE  PROTEINUR NEGATIVE  UROBILINOGEN 0.2  NITRITE NEGATIVE  LEUKOCYTESUR SMALL*   Micro Results: Recent Results (from the past 240 hour(s))  CULTURE, BLOOD (ROUTINE X 2)     Status: None   Collection Time    10/11/13  1:35 AM      Result Value Range Status   Specimen Description BLOOD RIGHT ARM   Final   Special Requests BOTTLES DRAWN AEROBIC AND ANAEROBIC 10CC EACH   Final   Culture  Setup Time     Final   Value: 10/11/2013 09:16     Performed at [facility]   Culture     Final    Value:        BLOOD CULTURE RECEIVED NO GROWTH TO DATE CULTURE WILL BE HELD FOR 5 DAYS BEFORE ISSUING A FINAL NEGATIVE REPORT     Performed at [facility]   Report Status PENDING   Incomplete  CULTURE, BLOOD (ROUTINE X 2)     Status: None   Collection Time    10/11/13  1:45 AM      Result Value Range Status   Specimen Description BLOOD RIGHT HAND   Final   Special Requests BOTTLES DRAWN AEROBIC ONLY 10CC   Final   Culture  Setup Time     Final   Value: 10/11/2013 09:16     Performed at [facility]   Culture     Final   Value:        BLOOD CULTURE RECEIVED NO GROWTH TO DATE CULTURE WILL BE HELD FOR 5 DAYS BEFORE ISSUING A FINAL NEGATIVE REPORT     Performed at [facility]   Report Status PENDING   Incomplete  URINE CULTURE     Status: None   Collection Time    10/11/13  3:02 AM      Result Value Range Status   Specimen Description URINE, CLEAN CATCH   Final   Special Requests CX ADDED AT 0435 ON [id number]   Final   Culture  Setup Time     Final   Value: 10/11/2013 04:45     Performed at [facility]     Final   Value: 30,000 COLONIES/ML     Performed at [facility]   Culture     Final   Value: DIPHTHEROIDS(CORYNEBACTERIUM SPECIES)     Note: Standardized susceptibility testing for this organism is not available.     Performed at [facility]   Report Status 10/12/2013 FINAL   Final   Medications: I have reviewed the patient's current medications. Scheduled Meds: . amantadine  100 mg Oral BID  . buPROPion  100 mg Oral BID  . docusate sodium  100 mg Oral BID  . feeding supplement (ENSURE COMPLETE)  237 mL Oral BID BM  . feeding supplement (PRO-STAT SUGAR FREE 64)  30 mL Oral BID WC  . heparin  5,000 Units Subcutaneous Q8H   Continuous Infusions:   PRN Meds:.ondansetron (ZOFRAN) IV, ondansetron, oxyCODONE, traZODone Assessment/Plan:  Levimasole vasculitis- Stable, on no antibiotics, afebrile, healing wounds.  Dressings in place on b/l  thighs, changed daily by patient or nursing.  Do not appear infected so no antibiotics. Blood cultures with NGTD.  -was going to get home health wound care but does not feel safe going home at this time -continue dressing changes--simple silicone foam dressing with gel only; patient is able to place these dressings if needed -oxycodone PO prn for pain-pain very well controlled -from a medical standpoint, she is stable   Depression with hx of substance abuse--feels depressed about her current situation and became tearful. She is worried that she will use again if she goes home and then end up in the hospital again. She would like to talk to psych today. When I asked her if she wants to quit using cocaine completely, she appeared unsure, but she stated "I feel like I have to stop."  -psych consulted- appreciate their recommendations about options for long term rehab for patient  -on wellbutrin bid 132m -on amantadine 1037mbid to reduce cocaine cravings -continue trazodone 5080mhs prn sleep -appreciate social work and CM assistance  Diet- regular Code- full DVT Ppx: Heparin Dispo: medically stable at this time but says she does not feel safe going back into society at this time. Feels depressed and wishes for behavioral health assistance.   The patient does not have a current PCP (No Pcp Per Patient) and [facility].  The patient does not have transportation limitations that hinder transportation to clinic appointments.  Services Needed at time of discharge: Y = Yes, Blank = No PT:   OT:   RN:   Equipment:   Other: Home health wound care    LOS: 6 days   RacRebecca EatonD 10/16/2013, 7:38 AM

## 2013-10-16 NOTE — Clinical Social Work Psych Note (Signed)
Received handoff from weekend covering CSW who completed psych assessment.  Honor Junes, LCSWA to document psychiatric assessment.  Psych CSW now following.   Nonnie Done, [place] [phone]  Clinical Social Work

## 2013-10-16 NOTE — Discharge Summary (Signed)
Name: [name] MRN: [id number] DOB: [birth year] 53 y.o. PCP: No Pcp Per Patient  Date of Admission: 10/10/2013 11:45 PM Date of Discharge: 10/17/2013 Attending Physician: Sid Falcon, MD  Discharge Diagnosis: Principal Problem:   Vasculitic leg ulcer- levamisole vasculitis Active Problems:   Depression- related to cocaine abuse   Tobacco abuse   Cocaine dependence   Infected open wound   Failure to thrive   Protein calorie malnutrition  Discharge Medications:   Medication List         amantadine 100 MG capsule  Commonly known as:  SYMMETREL  Take 1 capsule (100 mg total) by mouth 2 (two) times daily. For cravings of cocaine.     buPROPion 100 MG tablet  Commonly known as:  WELLBUTRIN  Take 1 tablet (100 mg total) by mouth 2 (two) times daily. For depression related to cocaine use.     mirtazapine 7.5 MG tablet  Commonly known as:  REMERON  Take 1 tablet (7.5 mg total) by mouth at bedtime.     oxyCODONE 5 MG immediate release tablet  Commonly known as:  Oxy IR/ROXICODONE  Take 1 tablet (5 mg total) by mouth every 4 (four) hours as needed for moderate pain.     traZODone 50 MG tablet  Commonly known as:  DESYREL  Take one tablet at bedtime for insomnia.       Disposition and follow-up:   [name] was discharged from [facility] in Stable condition.  At the hospital follow up visit please address:  1.  Levamisole vasculitis: wounds to b/l thighs, please assess for healing 2. Cocaine abuse: patient w/ depression and SI while hospitalized as well as desire to stop using cocaine, so she was discharged to inpatient psychiatric facility; she was started on mirtazapine and continued on her trazodone and Wellbutrin; please assess her depression  2.  Labs / imaging needed at time of follow-up: BMP   3.  Pending labs/ test needing follow-up: none  Follow-up Appointments: Follow-up Information   Follow up with Rebecca Eaton, MD  On 11/24/2013. (10:15am)    Specialty:  Internal Medicine   Contact information:   [place] [address] [phone]       Discharge Instructions: Discharge Orders   Future Orders Complete By Expires   Call MD for:  severe uncontrolled pain  As directed    Call MD for:  temperature >100.4  As directed    Diet - low sodium heart healthy  As directed    Increase activity slowly  As directed       Consultations: Treatment Team:  Durward Parcel, MD  Procedures Performed:  Dg Femur Left  10/11/2013   CLINICAL DATA:  Open sores near the knee.  Evaluate for infection  EXAM: LEFT FEMUR - 2 VIEW  COMPARISON:  09/24/2013  FINDINGS: There is a persistent ulcer along the anterior lower thigh. No subcutaneous emphysema. No radiodense foreign body. No changes of osteomyelitis.  Left hip osteoarthritis with marginal spurring.  IMPRESSION: No evidence of osteomyelitis or foreign body near the lower thigh ulcer.   Electronically Signed   By: Jorje Guild M.D.   On: 10/11/2013 01:24   Dg Femur Left  09/24/2013   CLINICAL DATA:  Two sores  EXAM: LEFT FEMUR - 2 VIEW  COMPARISON:  04/11/2010  FINDINGS: There is no evidence of fracture or other focal bone lesions. Soft tissue irregularity anterior to the distal femoral shaft. No  radiodense foreign body or subcutaneous gas.  IMPRESSION: No bone abnormality, subcutaneous gas, or foreign body.   Electronically Signed   By: Arne Cleveland M.D.   On: 09/24/2013 15:04   Admission HPI:  Pt is a 53 y.o. female with a PMH of cocaine abuse with associated levamisole-induced skin necrosis, depression, normocytic anemia, and RA who presents to the ED with c/o non-healing bilateral wounds on the lower extremities. She was discharged on 12/29 for levamisole-induced vasculitic skin necrosis after bilateral debridement. BC were negative for growth. Left wound cx grew proteus and enterococcus however, she was not given antibiotics d/t  adequately healing wounds without signs of systemic infection. Pt was to continue wet to dry dressing changes upon discharge and was provided gauze but apparently has only been changing the dressings once daily. Pt is homeless but was discharged to a friend's house with [facility] wound care nursing but states they never came. Associated symptoms include weakness, fever/chills, URI symptoms including cough, pharyngitis, and rhinorrhea. She denies any CP, SOB, abdominal pain, N/V/D/C, or urinary symptoms. She reports pain with weight bearing of her LE. She smokes 2-3 cigs/day for 42 years. She is "trying to" stay away from cocaine.  In the ED, surgery was consulted (Dr. Grandville Silos) who will see pt in the AM. She was started on vanco/zosyn. BC were obtained prior to pt receiving antibiotics. Dressings were changed in the ED with betadine and bacitracin were applied. Pt given Tdap, ibuprofen for pain, and 59mq of potassium d/t hypokalemia. Left femur XR revealed no evidence of osteomyelitis.    Hospital Course by problem list:   Levimasole vasculitis- Patient w/ recurrent and nonhealing ulcerations on b/l thighs. She received vanc/zosyn x 12 hrs, but quickly discontinued given wounds did not appear infected and her initial fever more likely 2/2 URI she had on admission. After HD 1, patient remained afebrile. Left femur xray negative for osteomyelitis. BCx negative. Surgery and wound care were consulted and neither though the wounds appeared infected or needed further intervention. Patient's pain was well controlled on oxycodone 552mq4h prn. The lesions had some mild bleeding intermittently, though this was minimal at most. Her wounds were dressed with silicone foam dressings, changed daily by wound care. During her stay her R thigh ulceration was healing quite well and her L thigh (2 ulcerations) showed a lot of progress with good granulation tissue filling in. These wounds will likely take weeks to resolve. I discharged  patient to inpatient rehab with some supplies to dress her wounds herself given they do not have staff to change her dressings. She was discharged on oxycodone 37m12m4h prn, #45. Of note, patient did have leukopenia (WBC 2-3), hyponatremia and hypokalemia during her admission. These lab abnormalities can all be explained by side effects levimasole, so were not worked up more extensively. Potassium was replaced on an as needed basis.   Depression with hx of substance abuse--Over the course of her hospitalization, patient became depressed about her condition and the fact that she would go use again if she was sent home. Patient then progressed and admitted she was suicidal. Psych evaluated her and recommended inpatient psych admission. We worked with the socEducation officer, museum set this up and [facility] patient on mirtazapine 7.37mg40mS. We continued patient's home wellbutrin and trazodone as well during this hospital stay and at discharge. Amantadine was also continued to suppress cocaine cravings.  URI-resolved during admission and tamiflu (3 days). Flu  pcr negative, so the tamiflu that had originally been started, was stopped.   Discharge Vitals:   BP 126/70  Pulse 82  Temp(Src) 98.6 F (37 C) (Oral)  Resp 18  Ht 5' 2"  (1.575 m)  Wt 97 lb 11.2 oz (44.316 kg)  BMI 17.86 kg/m2  SpO2 100%  Discharge Labs:  No results found for this or any previous visit (from the past 24 hour(s)).  Signed: Rebecca Eaton, MD 10/17/2013, 3:36 PM   Time Spent on Discharge: 35 minutes Services Ordered on Discharge: none Equipment Ordered on Discharge: none

## 2013-10-17 ENCOUNTER — Inpatient Hospital Stay (HOSPITAL_COMMUNITY)
Admission: AD | Admit: 2013-10-17 | Discharge: 2013-10-23 | DRG: 885 | Disposition: A | Discharge status: Home / Self Care | Payer: Medicaid Other | Source: Intra-hospital | Attending: Psychiatry | Admitting: Psychiatry

## 2013-10-17 ENCOUNTER — Encounter (HOSPITAL_COMMUNITY): Payer: Self-pay | Admitting: Family

## 2013-10-17 DIAGNOSIS — F1424 Cocaine dependence with cocaine-induced mood disorder: Secondary | ICD-10-CM | POA: Diagnosis present

## 2013-10-17 DIAGNOSIS — M069 Rheumatoid arthritis, unspecified: Secondary | ICD-10-CM | POA: Diagnosis present

## 2013-10-17 DIAGNOSIS — F19959 Other psychoactive substance use, unspecified with psychoactive substance-induced psychotic disorder, unspecified: Secondary | ICD-10-CM | POA: Diagnosis present

## 2013-10-17 DIAGNOSIS — F332 Major depressive disorder, recurrent severe without psychotic features: Principal | ICD-10-CM | POA: Diagnosis present

## 2013-10-17 DIAGNOSIS — Z79899 Other long term (current) drug therapy: Secondary | ICD-10-CM

## 2013-10-17 DIAGNOSIS — F1420 Cocaine dependence, uncomplicated: Secondary | ICD-10-CM

## 2013-10-17 DIAGNOSIS — F1994 Other psychoactive substance use, unspecified with psychoactive substance-induced mood disorder: Secondary | ICD-10-CM

## 2013-10-17 DIAGNOSIS — F17200 Nicotine dependence, unspecified, uncomplicated: Secondary | ICD-10-CM

## 2013-10-17 DIAGNOSIS — F3289 Other specified depressive episodes: Secondary | ICD-10-CM | POA: Diagnosis present

## 2013-10-17 DIAGNOSIS — I776 Arteritis, unspecified: Secondary | ICD-10-CM | POA: Diagnosis present

## 2013-10-17 DIAGNOSIS — F29 Unspecified psychosis not due to a substance or known physiological condition: Secondary | ICD-10-CM | POA: Diagnosis present

## 2013-10-17 DIAGNOSIS — F329 Major depressive disorder, single episode, unspecified: Secondary | ICD-10-CM | POA: Diagnosis present

## 2013-10-17 DIAGNOSIS — R627 Adult failure to thrive: Secondary | ICD-10-CM

## 2013-10-17 DIAGNOSIS — I10 Essential (primary) hypertension: Secondary | ICD-10-CM | POA: Diagnosis present

## 2013-10-17 DIAGNOSIS — F1910 Other psychoactive substance abuse, uncomplicated: Secondary | ICD-10-CM | POA: Diagnosis present

## 2013-10-17 DIAGNOSIS — R45851 Suicidal ideations: Secondary | ICD-10-CM

## 2013-10-17 LAB — CULTURE, BLOOD (ROUTINE X 2)
CULTURE: NO GROWTH
Culture: NO GROWTH

## 2013-10-17 MED ORDER — AMANTADINE HCL 100 MG PO CAPS
100.0000 mg | ORAL_CAPSULE | Freq: Two times a day (BID) | ORAL | Status: DC
  Administered 2013-10-17 – 2013-10-23 (×12): 100 mg via ORAL
  Filled 2013-10-17 (×7): qty 1
  Filled 2013-10-17: qty 6
  Filled 2013-10-17 (×3): qty 1
  Filled 2013-10-17: qty 6
  Filled 2013-10-17 (×8): qty 1

## 2013-10-17 MED ORDER — HYDROXYZINE HCL 25 MG PO TABS: 25.0000 mg | ORAL_TABLET | Freq: Four times a day (QID) | ORAL | Status: DC | PRN

## 2013-10-17 MED ORDER — LORAZEPAM 0.5 MG PO TABS
0.5000 mg | ORAL_TABLET | Freq: Two times a day (BID) | ORAL | Status: DC | PRN
  Administered 2013-10-21 – 2013-10-22 (×3): 0.5 mg via ORAL
  Filled 2013-10-17 (×3): qty 1

## 2013-10-17 MED ORDER — MIRTAZAPINE 15 MG PO TABS
7.5000 mg | ORAL_TABLET | Freq: Every day | ORAL | Status: AC
  Administered 2013-10-17: 7.5 mg via ORAL
  Filled 2013-10-17: qty 1
  Filled 2013-10-17: qty 0.5

## 2013-10-17 MED ORDER — ENSURE COMPLETE PO LIQD: 237.0000 mL | ORAL | Status: DC

## 2013-10-17 MED ORDER — HYDROXYZINE HCL 25 MG PO TABS
25.0000 mg | ORAL_TABLET | Freq: Four times a day (QID) | ORAL | Status: DC | PRN
  Administered 2013-10-20 – 2013-10-22 (×4): 25 mg via ORAL
  Filled 2013-10-17 (×6): qty 1

## 2013-10-17 MED ORDER — NICOTINE 14 MG/24HR TD PT24
14.0000 mg | MEDICATED_PATCH | Freq: Every day | TRANSDERMAL | Status: DC
  Administered 2013-10-18 – 2013-10-23 (×6): 14 mg via TRANSDERMAL
  Filled 2013-10-17 (×9): qty 1

## 2013-10-17 MED ORDER — BUPROPION HCL 100 MG PO TABS
100.0000 mg | ORAL_TABLET | Freq: Two times a day (BID) | ORAL | Status: DC
  Administered 2013-10-17 – 2013-10-23 (×12): 100 mg via ORAL
  Filled 2013-10-17 (×9): qty 1
  Filled 2013-10-17 (×2): qty 6
  Filled 2013-10-17 (×8): qty 1

## 2013-10-17 MED ORDER — MIRTAZAPINE 7.5 MG PO TABS
7.5000 mg | ORAL_TABLET | Freq: Every day | ORAL | Status: DC
  Administered 2013-10-18 – 2013-10-22 (×5): 7.5 mg via ORAL
  Filled 2013-10-17 (×7): qty 1
  Filled 2013-10-17: qty 3

## 2013-10-17 MED ORDER — MAGNESIUM HYDROXIDE 400 MG/5ML PO SUSP: 30.0000 mL | Freq: Every day | ORAL | Status: DC | PRN

## 2013-10-17 MED ORDER — OXYCODONE HCL 5 MG PO TABS: 5.0000 mg | ORAL_TABLET | ORAL | Status: DC | PRN

## 2013-10-17 MED ORDER — TRAZODONE HCL 50 MG PO TABS
50.0000 mg | ORAL_TABLET | Freq: Every day | ORAL | Status: DC
  Administered 2013-10-17 – 2013-10-22 (×6): 50 mg via ORAL
  Filled 2013-10-17 (×8): qty 1
  Filled 2013-10-17: qty 3

## 2013-10-17 MED ORDER — ALUM & MAG HYDROXIDE-SIMETH 200-200-20 MG/5ML PO SUSP: 30.0000 mL | ORAL | Status: DC | PRN

## 2013-10-17 MED ORDER — MIRTAZAPINE 7.5 MG PO TABS: 7.5000 mg | ORAL_TABLET | Freq: Every day | ORAL | Status: DC

## 2013-10-17 MED ORDER — OXYCODONE HCL 5 MG PO TABS
5.0000 mg | ORAL_TABLET | ORAL | Status: DC | PRN
  Administered 2013-10-17 – 2013-10-20 (×8): 5 mg via ORAL
  Filled 2013-10-17 (×9): qty 1

## 2013-10-17 NOTE — Tx Team (Signed)
Initial Interdisciplinary Treatment Plan  PATIENT STRENGTHS: (choose at least two) Ability for insight Average or above average intelligence Capable of independent living Motivation for treatment/growth  PATIENT STRESSORS: Health problems Substance abuse   PROBLEM LIST: Problem List/Patient Goals Date to be addressed Date deferred Reason deferred Estimated date of resolution  Depression 10/17/13     Suicidal Ideation 10/17/13     Substance Abuse 10/17/13                                          DISCHARGE CRITERIA:  Ability to meet basic life and health needs Improved stabilization in mood, thinking, and/or behavior Verbal commitment to aftercare and medication compliance  PRELIMINARY DISCHARGE PLAN: Attend aftercare/continuing care group Return to previous living arrangement  PATIENT/FAMIILY INVOLVEMENT: This treatment plan has been presented to and reviewed with the patient, [name], and/or family member, .  The patient and family have been given the opportunity to ask questions and make suggestions.  [place], [place] 10/17/2013, 7:01 PM

## 2013-10-17 NOTE — Progress Notes (Signed)
D: Patient in the dayroom on first approach.  Patient states she is in constant pain.  Patient states her legs hurt intermittently.  Patient states she was in the hospital for medical treatment but states she does not feel she was ready to go home.  Patient states, "I dont want to be depressed no more and I want to stay off that S**T."  When patient was asked what she was speaking of and she stated crack cocaine.  Patient denies SI/HI and denies AVH. A: Staff to monitor Q 15 mins for safety.  Encouragement and support offered.  Scheduled medications administered per orders.  Oxycodone administered for leg pain prn. R: Patient remains safe on the unit.  Patient attended group tonight.  Patient taking administered medications.  Patient visible on the unit.

## 2013-10-17 NOTE — Progress Notes (Signed)
53 year old female pt admitted on voluntary basis. Pt reports that she felt she was having a cold and checked into the hospital, while there pt endorsed depression and SI. On admission, it is noted that pt has sores to both upper legs. Pt stated that she has been on medical floor for the past week taking care of this issue. Pt, on admission, does endorse depression but denies any SI and is able to contract for safety on the unit. Pt stated that she is trying to stay away from cocaine and distance herself from those people. Pt was oriented to the unit and safety maintained.

## 2013-10-17 NOTE — [facility] Group Notes (Signed)
Adult Psychoeducational Group Note  Date:  10/17/2013 Time:  9:05 PM  Group Topic/Focus:  Wrap-Up Group:   The focus of this group is to help patients review their daily goal of treatment and discuss progress on daily workbooks.  Participation Level:  Minimal  Participation Quality:  Appropriate  Affect:  Flat  Cognitive:  Appropriate  Insight: Good  Engagement in Group:  Limited  Modes of Intervention:  Discussion  Additional Comments:  [name] stated she woke up this morning and she has been resting. Two positive things she expressed about herself are she is a nice person and she tries to get along with everybody.  [name] A 10/17/2013, 9:05 PM

## 2013-10-17 NOTE — H&P (Signed)
Psychiatric Admission Assessment Adult  Patient Identification:  [name] Date of Evaluation:  10/17/2013 Chief Complaint:  MAJOR DEPRESSIVE DISORDER History of Present Illness::  [name] is an 53 y.o. Female known to Dr. Louretta Shorten from her previous 2 acute psychiatric hospitalization within the last 6 months for 4 cocaine abuse versus dependence and also substance induced mood disorder. Patient was admitted to the [facility] levamisole-induced skin necrosis, depression, normocytic anemia, and RA. Patient has been staying with their roommate/friend otherwise she was to homeless. She smokes 2-3 cigs/day for 42 years. She is "trying to" stay away from cocaine.   Pt affirms SI with a plan to "cust wrists" and states that she is "tired of life". Reports poor sleep, but good appetite (addressed). Pt was crying throughout the assessment. Rates continues to minimize anxiety symptoms, but does appear visibly anxious and rates depression 5/10. Denies AVH; denies physical complaints. Pt also mentions that she has only used "cocaine twice since Dec 2014". 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 Will change dsg as soon as supplies arrive and will consider consultation with wound care if they do not appear to be healing well.  Pt is in agreement with current medication regimen and treatment plan.    Elements:  Location:  Generalized, inpatient. Quality:  Worsening. Severity:  Severe. Timing:  Constant. Duration:  Chronic. Associated Signs/Synptoms: Depression Symptoms:  depressed mood, anhedonia, insomnia, psychomotor agitation, difficulty concentrating, suicidal thoughts with specific plan, anxiety, (Hypo) Manic Symptoms:  Impulsivity, Anxiety Symptoms:  Excessive Worry, Psychotic Symptoms:   PTSD  Symptoms: NA  Psychiatric Specialty Exam: Physical Exam  Constitutional: She is oriented to person, place, and time. She appears well-developed. She appears distressed (high anxiety, impulsive).  HENT:  Head: Normocephalic and atraumatic.  Nose: Nose normal.  Mouth/Throat: Oropharynx is clear and moist.  Eyes: Conjunctivae and EOM are normal. Pupils are equal, round, and reactive to light.  Neck: Normal range of motion. Neck supple.  Cardiovascular: Normal rate, regular rhythm, normal heart sounds and intact distal pulses.  Exam reveals no gallop and no friction rub.   No murmur heard. Respiratory: Effort normal and breath sounds normal.  GI: Soft. Bowel sounds are normal.  Genitourinary:  Not assessed   Musculoskeletal: Normal range of motion.  Neurological: She is alert and oriented to person, place, and time. She has normal reflexes.  Skin: Skin is warm and dry.  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   Psychiatric: Her behavior is normal.    Review of Systems  Constitutional: Negative.   HENT: Negative.   Eyes: Negative.   Respiratory: Negative.   Cardiovascular: Negative.   Gastrointestinal: Negative.   Genitourinary: Negative.   Musculoskeletal: Negative.   Skin: Negative.   Neurological: Negative.   Endo/Heme/Allergies: Negative.   Psychiatric/Behavioral: Positive for depression (pt rates 5/10), suicidal ideas (talks about ideas to cut her wrists) and substance abuse (longstanding hx of cocaine abuse). Negative for hallucinations and memory loss. The patient is nervous/anxious (pt claims there is no anxiety, but pt is visibly anxious, impulsive, and requesting staff attention constantly) and has insomnia.     Blood pressure 134/83, pulse 91, temperature 97.3 F (  36.3 C), temperature  source Oral, resp. rate 18, height 5' 1.5" (1.562 m), weight 45.36 kg (100 lb).Body mass index is 18.59 kg/(m^2).  General Appearance: Disheveled  Eye Contact::  Good  Speech:  Clear and Coherent  Volume:  Increased  Mood:  Anxious and Depressed  Affect:  Depressed and Inappropriate  Thought Process:  Disorganized  Orientation:  Full (Time, Place, and Person)  Thought Content:  WDL  Suicidal Thoughts:  Yes.  without intent/plan  Homicidal Thoughts:  No  Memory:  Immediate;   Fair Recent;   Fair Remote;   Fair  Judgement:  Fair  Insight:  Fair  Psychomotor Activity:  NA  Concentration:  Fair  Recall:  Fair  Akathisia:  No  Handed:  Right  AIMS (if indicated):     Assets:  Desire for Improvement Resilience  Sleep:   Intermittent insomnia    Past Psychiatric History: Diagnosis: Major depression, recurrent, severe; substance abuse; substance-induced mood disorder; cocaine dependence.  Hospitalizations: [facility] (2 admissions prior to current)  Outpatient Care: Affirms, but uncertain of where or when  Substance Abuse Care: Denies  Self-Mutilation: Denies  Suicidal Attempts: Denies  Violent Behaviors: Denies   Past Medical History:   Past Medical History  Diagnosis Date  . 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 Followed by Dr. Louanne Skye  . Hypertension   . Cocaine abuse     ongoing with resultant vaculitis.  . Tobacco abuse   . Depression   . Normocytic anemia     BL Hgb 9.8-12. Last anemia panel 04/2010 - showing Fe 19, ferritin 101.  Pt on monthly B12 injections  . Rheumatoid arthritis(714.0)     patient reported  . Inflammatory arthritis   . Headache(784.0)    None. Allergies:   Allergies  Allergen Reactions  . Acetaminophen Swelling    REACTION: eyelid swelling   PTA Medications: Prescriptions prior to admission  Medication Sig Dispense Refill  . amantadine (SYMMETREL) 100 MG capsule Take 1 capsule (100 mg total) by mouth 2 (two) times daily. For cravings of  cocaine.  60 capsule  0  . buPROPion (WELLBUTRIN) 100 MG tablet Take 1 tablet (100 mg total) by mouth 2 (two) times daily. For depression related to cocaine use.  60 tablet  0  . mirtazapine (REMERON) 7.5 MG tablet Take 1 tablet (7.5 mg total) by mouth at bedtime.  30 tablet  1  . oxyCODONE (OXY IR/ROXICODONE) 5 MG immediate release tablet Take 1 tablet (5 mg total) by mouth every 4 (four) hours as needed for moderate pain.  45 tablet  0  . traZODone (DESYREL) 50 MG tablet Take one tablet at bedtime for insomnia.  15 tablet  0    Previous Psychotropic Medications:  Medication/Dose  SEE MAR               Substance Abuse History in the last 12 months:  yes  Consequences of Substance Abuse: Negative  Social History:  reports that she has been smoking Cigarettes.  She has a 19.5 pack-year smoking history. She has never used smokeless tobacco. She reports that she uses illicit drugs ("Crack" cocaine, Cocaine, and Marijuana). She reports that she does not drink alcohol. 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 Breast cancer Mother     Breast cancer  . Alcohol abuse Mother   . Colon cancer Maternal Aunt 65  . Alcohol abuse Father     No results found for this or any previous visit (from the past 15 hour(s)). Psychological Evaluations:  Assessment:   DSM5:  Substance/Addictive  Disorders:  Cocaine abuse, cocaine dependence Depressive Disorders:  Major Depressive Disorder - Severe (296.23)  AXIS I:  Major Depression, Recurrent severe, Substance Abuse and Substance Induced Mood Disorder AXIS II:  Deferred AXIS III:   Past Medical History  Diagnosis Date  . 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 Followed by Dr. Louanne Skye  . Hypertension   . Cocaine abuse     ongoing with resultant vaculitis.  . Tobacco abuse   . Depression   . Normocytic anemia     BL Hgb 9.8-12. Last anemia panel 04/2010 - showing Fe 19, ferritin 101.  Pt on monthly B12 injections  . Rheumatoid arthritis(714.0)     patient reported  . Inflammatory arthritis   . Headache(784.0)    AXIS IV:  economic problems, educational problems, housing problems, occupational problems, other psychosocial or environmental problems, problems related to legal system/crime, problems related to social environment, problems with access to health care services and problems with primary support group AXIS V:  41-50 serious symptoms  Treatment Plan/Recommendations:   Review of chart, vital signs, medications, and notes.  1-Individual and group therapy  2-Medication management for depression and anxiety: Medications reviewed with the patient and she stated no untoward effects. Increased trazodone to 161m qhs for sleep.  3-Coping skills for depression, anxiety  4-Continue crisis stabilization and management  5-Address health issues--monitoring vital signs, stable  6-Treatment plan in progress to prevent relapse of depression and anxiety  Treatment Plan Summary: Daily contact with patient to assess and evaluate symptoms and progress in treatment Medication management Current Medications:   Current Facility-Administered Medications  Medication Dose Route Frequency Provider Last Rate Last Dose  . alum & mag hydroxide-simeth (MAALOX/MYLANTA) 200-200-20 MG/5ML suspension 30 mL  30 mL Oral Q4H PRN JBenjamine Mola FNP       . amantadine (SYMMETREL) capsule 100 mg  100 mg Oral BID JBenjamine Mola FNP      . buPROPion ([facility] tablet 100 mg  100 mg Oral BID JBenjamine Mola FNP      . hydrOXYzine (ATARAX/VISTARIL) tablet 25 mg  25 mg Oral Q6H PRN JBenjamine Mola FNP      . LORazepam (ATIVAN) tablet 0.5 mg  0.5 mg Oral BID PRN JBenjamine Mola FNP      . magnesium hydroxide (MILK OF MAGNESIA) suspension 30 mL  30 mL Oral Daily PRN JBenjamine Mola FNP      . mirtazapine (REMERON) tablet 7.5 mg  7.5 mg Oral QHS JBenjamine Mola FNP      . [START ON 10/18/2013] nicotine (NICODERM CQ - dosed in mg/24 hours) patch 14 mg  14 mg Transdermal Q0600 JBenjamine Mola FNP      . oxyCODONE (Oxy IR/ROXICODONE) immediate release tablet 5 mg  5 mg Oral Q4H PRN JElyse Jarvis  [place], FNP      . traZODone (DESYREL) tablet 50 mg  50 mg Oral QHS Benjamine Mola, FNP         Observation Level/Precautions:  15 minute checks  Laboratory:  Labs resulted, reviewed, and stable at this time.   Psychotherapy:  Group therapy, individual therapy, psychoeducation  Medications:  See MAR above  Consultations: None    Discharge Concerns: None    Estimated LOS: 5-7 days  Other:  N/A   I certify that inpatient services furnished can reasonably be expected to improve the patient's condition.   Benjamine Mola, [place] 1/13/20156:52 PM   Patient was seen face-to-face for psychiatric evaluation, suicide risk assessment and case discussed with the physician extender. Reviewed the information documented by physician extender and agree with the treatment plan.  Yasheka Fossett,JANARDHAHA R. 10/18/2013 5:06 PM

## 2013-10-17 NOTE — Progress Notes (Addendum)
Subjective: [name] mood is much better this morning. She is excited to have been sober as long as she has and she thinks she will do well at inpatient psychiatry. She notes her prior levamisole vasculitic lesions to bilateral ears were oozing today.   Objective: Vital signs in last 24 hours: Filed Vitals:   10/16/13 0942 10/16/13 1412 10/16/13 1800 10/16/13 2215  BP: 98/61 108/56 102/52 109/71  Pulse: 75 58 69 71  Temp: 97.7 F (36.5 C) 98 F (36.7 C) 98.5 F (36.9 C) 98.1 F (36.7 C)  TempSrc: Oral Oral Oral Oral  Resp: 16 18 20 16   Height:      Weight:      SpO2: 100% 100% 98% 100%   Weight change:   Intake/Output Summary (Last 24 hours) at 10/17/13 0800 Last data filed at 10/16/13 1800  Gross per 24 hour  Intake    360 ml  Output      0 ml  Net    360 ml    Physical Exam General: resting in bed, NAD HEENT: PERRL, vasculitic lesions on pinna of b/l ears, tender to palpation, darkened discoloration w/ some oozing Lungs: clear to ascultation bilaterally Heart: RRR Abdomen: soft, non-tender, thing, non-distended, +bs Extremities: warm, thin extremities b/l, R leg erosion is healing well with no oozing or drainage; the proximal L ulceration had some serosanguinous oozing, though no purulence or induration; the more distal and deeper ulceration on L leg has abundant granulation tissue and is healing well; dark macules to distal tip of L 2nd, 3rd, 4th toes, no drainage or skin breakdown Psych: mood improved this morning; appears to be more hopeful Neurologic: alert & oriented X3  Lab Results: Basic Metabolic Panel:  Recent Labs Lab 10/11/13 0520 10/12/13 0916 10/14/13 0329  NA 143 135* 140  K 3.4* 3.8 4.6  CL 106 99 99  CO2 25 24 28   GLUCOSE 83 120* 102*  BUN 13 10 22   CREATININE 0.83 0.74 0.73  CALCIUM 8.7 8.5 9.8  MG 1.8  --   --    Liver Function Tests:  Recent Labs Lab 10/11/13 0520  AST 14  ALT 8  ALKPHOS 71  BILITOT <0.2*  PROT 6.4    ALBUMIN 2.9*   CBC:  Recent Labs Lab 10/11/13 0034 10/11/13 0038 10/11/13 0145  WBC 3.4*  --  3.3*  NEUTROABS 1.7  --  1.6*  HGB 10.4* 9.9* 10.5*  HCT 30.0* 29.0* 30.2*  MCV 86.0  --  85.8  PLT 250  --  243   Urine Drug Screen: Drugs of Abuse     Component Value Date/Time   LABOPIA NONE DETECTED 10/11/2013 0302   LABOPIA PPS 08/31/2012 1045   COCAINSCRNUR POSITIVE* 10/11/2013 0302   COCAINSCRNUR NEG 08/31/2012 1045   LABBENZ NONE DETECTED 10/11/2013 0302   LABBENZ NEG 08/31/2012 1045   LABBENZ NEGATIVE 06/12/2011 0221   AMPHETMU NONE DETECTED 10/11/2013 0302   AMPHETMU NEGATIVE 06/12/2011 0221   THCU NONE DETECTED 10/11/2013 0302   THCU 63.4* 05/20/2012 1405   LABBARB NONE DETECTED 10/11/2013 0302   LABBARB NEG 08/31/2012 1045    Urinalysis:  Recent Labs Lab 10/11/13 0302  COLORURINE YELLOW  LABSPEC 1.015  PHURINE 6.0  GLUCOSEU NEGATIVE  HGBUR TRACE*  BILIRUBINUR NEGATIVE  KETONESUR NEGATIVE  PROTEINUR NEGATIVE  UROBILINOGEN 0.2  NITRITE NEGATIVE  LEUKOCYTESUR SMALL*   Micro Results: Recent Results (from the past 240 hour(s))  CULTURE, BLOOD (ROUTINE X 2)  Status: None   Collection Time    10/11/13  1:35 AM      Result Value Range Status   Specimen Description BLOOD RIGHT ARM   Final   Special Requests BOTTLES DRAWN AEROBIC AND ANAEROBIC 10CC EACH   Final   Culture  Setup Time     Final   Value: 10/11/2013 09:16     Performed at [facility]   Culture     Final   Value:        BLOOD CULTURE RECEIVED NO GROWTH TO DATE CULTURE WILL BE HELD FOR 5 DAYS BEFORE ISSUING A FINAL NEGATIVE REPORT     Performed at [facility]   Report Status PENDING   Incomplete  CULTURE, BLOOD (ROUTINE X 2)     Status: None   Collection Time    10/11/13  1:45 AM      Result Value Range Status   Specimen Description BLOOD RIGHT HAND   Final   Special Requests BOTTLES DRAWN AEROBIC ONLY 10CC   Final   Culture  Setup Time     Final   Value: 10/11/2013 09:16      Performed at [facility]   Culture     Final   Value:        BLOOD CULTURE RECEIVED NO GROWTH TO DATE CULTURE WILL BE HELD FOR 5 DAYS BEFORE ISSUING A FINAL NEGATIVE REPORT     Performed at [facility]   Report Status PENDING   Incomplete  URINE CULTURE     Status: None   Collection Time    10/11/13  3:02 AM      Result Value Range Status   Specimen Description URINE, CLEAN CATCH   Final   Special Requests CX ADDED AT 0435 ON [id number]   Final   Culture  Setup Time     Final   Value: 10/11/2013 04:45     Performed at [place]     Final   Value: 30,000 COLONIES/ML     Performed at [facility]   Culture     Final   Value: DIPHTHEROIDS(CORYNEBACTERIUM SPECIES)     Note: Standardized susceptibility testing for this organism is not available.     Performed at [facility]   Report Status 10/12/2013 FINAL   Final   Medications: I have reviewed the patient's current medications. Scheduled Meds: . amantadine  100 mg Oral BID  . buPROPion  100 mg Oral BID  . docusate sodium  100 mg Oral BID  . feeding supplement (ENSURE COMPLETE)  237 mL Oral BID BM  . feeding supplement (PRO-STAT SUGAR FREE 64)  30 mL Oral BID WC  . heparin  5,000 Units Subcutaneous Q8H  . mirtazapine  7.5 mg Oral QHS   Continuous Infusions:   PRN Meds:.ondansetron (ZOFRAN) IV, ondansetron, oxyCODONE, traZODone Assessment/Plan:  Levimasole vasculitis- MEDICALLY STABLE. SHE IS FULLY AMBULATORY. No concern for infection- VSS, afebrile, healing wounds. Mild oozing to bilateral ears this morning. Dressings in place on b/l thighs, changed daily by patient or nursing.  Do not appear infected so no antibiotics. Blood cultures with NGTD.  -continue dressing changes--simple silicone foam dressing with gel only; patient is able to place these dressings if needed -oxycodone PO prn for pain-pain very well controlled  Depression with hx of substance abuse--Patient's mood  improved this morning. She was seen by psych and they recommended inpatient rehab. They also added mirtazapine to  her depression regimen. -mirtazapine 7.80m qHS  -on wellbutrin bid 1020m-on amantadine 10032mid to reduce cocaine cravings -continue trazodone 8m41ms prn sleep -appreciate social work and CM assistance  Diet- regular Code- full DVT Ppx: Heparin Dispo: medically stable at this time but says she does not feel safe going back into society at this time. Feels depressed and wishes for behavioral health assistance.   The patient does not have a current PCP (No Pcp Per Patient) and does need [facility] after discharge.  The patient does not have transportation limitations that hinder transportation to clinic appointments.  Services Needed at time of discharge: Y = Yes, Blank = No PT:   OT:   RN:   Equipment:   Other: Home health wound care    LOS: 7 days   RachRebecca Eaton 10/17/2013, 8:00 AM

## 2013-10-17 NOTE — Progress Notes (Signed)
Patient to be discharged to [facility] today. Report given to [facility]. Patient belongings at nurse's station to be sent with patient. Will monitor.  Aaron Edelman, [place]

## 2013-10-17 NOTE — Discharge Instructions (Signed)
Vasculitis Vasculitis is when your blood vessels are inflamed. There are many different blood vessels in the body, and vasculitis can affect any of them. This includes large (veins and arteries) and small (capillaries) vessels. With vasculitis,   Blood vessel walls can become thick.  Blood vessels can become narrow.  Blood vessels can become weak. Sometimes, it becomes so weak that the blood vessel bulges out like a balloon. This is called an aneurysm. Aneurysms are rare but can be life-threatening.  Scarring can occur.  Not enough blood can flow through the blood vessels. All of these things can damage many parts of the body, including the muscles, kidneys, lungs and brain. There are many types of vasculitis. Some types are short-term (acute), while others are long-term (chronic). Some types may go away without treatment, and others may need to be treated for a long time.  CAUSES  Vasculitis occurs when the body's immune system (which fights germs and disease) makes a mistake. It attacks its own blood vessels. This causes inflammation (the body's way of reacting to injury or infection).   Why this happens is usually not known. The condition is then called primary vasculitis.  Sometimes, something triggers the inflammation. This is called secondary vasculitis. Possible causes include:  Infections.  An immune system disease. Examples include lupus, rheumatoid arthritis and scleroderma.  An allergic reaction to a medicine.  Cancer that affects blood cells. This includes leukemia and lymphoma.  Males and females of all ages and races can develop vasculitis. Some risk factors make vasculitis more likely. These include:  Smoking.  Stress.  Physical injury. SYMPTOMS  There are more than 20 types of vasculitis. Symptoms of each type vary, but some symptoms are common.  Many people with vasculitis:  Have a fever.  Do not feel like eating.  Lose weight.  Feel very tired.  Have  aches and pains.  Feel weak.  Start to not have feeling (numbness) in an area.  Symptoms for some types of vasculitis also could be:  Sores in the mouth or eyes.  Skin problems. This could be sores, spots or rashes.  Trouble seeing.  Trouble breathing.  Blood in the urine.  Headaches.  Pain in the abdomen.  Stuffy or bloody nose. DIAGNOSIS  Vasculitis symptoms are similar to symptoms of many other conditions. That can make it hard to tell if you have vasculitis. To be sure, your caregiver will ask about your symptoms and do a physical exam. Certain tests may be necessary, such as:   A complete blood count (CBC). This test shows how many red blood cells are in your blood. Not having enough red blood cells (anemic) can result from vasculitis.  Erythrocyte sedimentation (also called sed rate test). It measures inflammation in the body.  C reactive protein (CRP). This also shows if there is inflammation.  Anti-neutrophil cytoplasmic antibodies (ANCA). This can tell if the immune system is reacting to certain cells in the blood.  A urine test. This checks for blood or protein in the urine. That could be a sign of kidney damage from vasculitis.  Imaging tests. These tests create pictures from inside the body. Options include:  X-rays.  Computed tomography (CT) scan. This uses X-rays guided by a computer.  Ultrasound. It creates an image using sound waves.  Magnetic resonance imaging (MRI). It uses radio waves, magnets and a computer.  Angiography. A dye is put into your blood vessels. Then, an X-ray is taken of them.  A biopsy of  a blood vessel. This means your caregiver will take out a small piece of a blood vessel. Then, it is checked under a microscope. This is an important test. It often is the best way to know for sure if you have vasculitis. TREATMENT   Treatment will depend on the type of vasculitis and how severe it is. Often, you will need to see a specialist in  immunologic diseases (rheumatologist).  Some types of vasculitis may go away without treatment.  Some types need only over-the-counter drugs.  Prescription medicines are used to treat many types of vasculitis. For example:  Corticosteroids. These are the drugs used most often. They are very powerful. Usually, a high dose is taken until symptoms improve. Then, the dose is gradually decreased. Using corticosteroids for a long time can cause problems. They can make muscles and bones weak. They can cause blood pressure to go up, and cause diabetes. Also, people often gain weight when they take corticosteroids.  Cytotoxic drugs. These kill cells that cause inflammation. Sometimes, they are used if corticosteroids do not help. Other times, both medications are taken.  Surgery. This may be needed to repair a blood vessel that has bulged out (aneurysm).  Treatment can sometimes cure your disease. Other times, it can put the disease in remission (no symptoms). Increased treatment and reevaluation might be necessary if your disease comes back or flares. HOME CARE INSTRUCTIONS   Take any medications that your caregiver prescribes. Follow the directions carefully.  Watch for any problems that can be caused by a drug (side effects). Tell your caregiver right away if you notice any changes or problems.  Keep all appointments for checkups. This is important to help your caregiver watch for side effects. Checkups may include:  Periodic blood tests.  Bone density testing. This checks how strong or weak your bones are.  Blood pressure checks. If your blood pressure rises, you may need to take a drug to control it while you are taking corticosteroids.  Blood sugar checks. This is to be sure you are not developing diabetes. If you have diabetes, corticosteroid medications may make it worse and require increased treatment.  Exercise. First, talk with your caregiver about what would be OK for you to do.  Aerobic exercise (which increases your heart rate) is often suggested. It includes walking. This type of exercise is good because it helps prevent bone loss. It also helps control your blood pressure.  Follow a healthy diet. Include good sources of protein in your diet. Also include fruits, vegetables and whole grains. Your caregiver can refer you to an expert on healthy eating (dietitian) for more detailed advice.  Learn as much as you can about vasculitis. Understanding your condition can help you cope with it. Coping can be hard because this may be something you will have to live with for years.  Consider joining a support group. It often helps to talk about your worries with others who have the same problems.  Tell your caregiver if you feel stressed, anxious or depressed. Your caregiver may refer you to a specialist, or recommend medication to relieve your symptoms. SEEK MEDICAL CARE IF:   The symptoms that led to your diagnosis return.  You develop worsening fever, fatigue, headache, weight loss or pain in your jaw.  You develop signs of infection. Infections can be worse if you are on corticosteroid medication.  You develop any new or unexplained symptoms of disease. SEEK IMMEDIATE MEDICAL CARE IF:   Your eyesight changes.  Pain does not go away, even after taking medication.  You feel pain in your chest or abdomen.  You have trouble breathing.  One side of your face or body becomes suddenly weak or numb.  Your nose bleeds.  There is blood in your urine.  You develop a fever of more than 102 F (38.9 C). Document Released: 07/18/2009 Document Revised: 12/14/2011 Document Reviewed: 07/18/2009 [facility] Patient Information 2014 [place].   Wound Care Wound care helps prevent pain and infection.  You may need a tetanus shot if:  You cannot remember when you had your last tetanus shot.  You have never had a tetanus shot.  The injury broke your skin. If you  need a tetanus shot and you choose not to have one, you may get tetanus. Sickness from tetanus can be serious. HOME CARE   Only take medicine as told by your doctor.  Clean the wound daily with mild soap and water.  Change any bandages (dressings) as told by your doctor.  Put medicated cream and a bandage on the wound as told by your doctor.  Change the bandage if it gets wet, dirty, or starts to smell.  Take showers. Do not take baths, swim, or do anything that puts your wound under water.  Rest and raise (elevate) the wound until the pain and puffiness (swelling) are better.  Keep all doctor visits as told. GET HELP RIGHT AWAY IF:   Yellowish-white fluid (pus) comes from the wound.  Medicine does not lessen your pain.  There is a red streak going away from the wound.  You have a fever. MAKE SURE YOU:   Understand these instructions.  Will watch your condition.  Will get help right away if you are not doing well or get worse. Document Released: 06/30/2008 Document Revised: 12/14/2011 Document Reviewed: 01/25/2011 [facility] Patient Information 2014 [place], [place].   Vasculitis Vasculitis is when your blood vessels are inflamed. There are many different blood vessels in the body, and vasculitis can affect any of them. This includes large (veins and arteries) and small (capillaries) vessels. With vasculitis,   Blood vessel walls can become thick.  Blood vessels can become narrow.  Blood vessels can become weak. Sometimes, it becomes so weak that the blood vessel bulges out like a balloon. This is called an aneurysm. Aneurysms are rare but can be life-threatening.  Scarring can occur.  Not enough blood can flow through the blood vessels. All of these things can damage many parts of the body, including the muscles, kidneys, lungs and brain. There are many types of vasculitis. Some types are short-term (acute), while others are long-term (chronic). Some types may go away  without treatment, and others may need to be treated for a long time.  CAUSES  Vasculitis occurs when the body's immune system (which fights germs and disease) makes a mistake. It attacks its own blood vessels. This causes inflammation (the body's way of reacting to injury or infection).   Why this happens is usually not known. The condition is then called primary vasculitis.  Sometimes, something triggers the inflammation. This is called secondary vasculitis. Possible causes include:  Infections.  An immune system disease. Examples include lupus, rheumatoid arthritis and scleroderma.  An allergic reaction to a medicine.  Cancer that affects blood cells. This includes leukemia and lymphoma.  Males and females of all ages and races can develop vasculitis. Some risk factors make vasculitis more likely. These include:  Smoking.  Stress.  Physical injury. SYMPTOMS  There are more than 20 types of vasculitis. Symptoms of each type vary, but some symptoms are common.  Many people with vasculitis:  Have a fever.  Do not feel like eating.  Lose weight.  Feel very tired.  Have aches and pains.  Feel weak.  Start to not have feeling (numbness) in an area.  Symptoms for some types of vasculitis also could be:  Sores in the mouth or eyes.  Skin problems. This could be sores, spots or rashes.  Trouble seeing.  Trouble breathing.  Blood in the urine.  Headaches.  Pain in the abdomen.  Stuffy or bloody nose. DIAGNOSIS  Vasculitis symptoms are similar to symptoms of many other conditions. That can make it hard to tell if you have vasculitis. To be sure, your caregiver will ask about your symptoms and do a physical exam. Certain tests may be necessary, such as:   A complete blood count (CBC). This test shows how many red blood cells are in your blood. Not having enough red blood cells (anemic) can result from vasculitis.  Erythrocyte sedimentation (also called sed rate  test). It measures inflammation in the body.  C reactive protein (CRP). This also shows if there is inflammation.  Anti-neutrophil cytoplasmic antibodies (ANCA). This can tell if the immune system is reacting to certain cells in the blood.  A urine test. This checks for blood or protein in the urine. That could be a sign of kidney damage from vasculitis.  Imaging tests. These tests create pictures from inside the body. Options include:  X-rays.  Computed tomography (CT) scan. This uses X-rays guided by a computer.  Ultrasound. It creates an image using sound waves.  Magnetic resonance imaging (MRI). It uses radio waves, magnets and a computer.  Angiography. A dye is put into your blood vessels. Then, an X-ray is taken of them.  A biopsy of a blood vessel. This means your caregiver will take out a small piece of a blood vessel. Then, it is checked under a microscope. This is an important test. It often is the best way to know for sure if you have vasculitis. TREATMENT   Treatment will depend on the type of vasculitis and how severe it is. Often, you will need to see a specialist in immunologic diseases (rheumatologist).  Some types of vasculitis may go away without treatment.  Some types need only over-the-counter drugs.  Prescription medicines are used to treat many types of vasculitis. For example:  Corticosteroids. These are the drugs used most often. They are very powerful. Usually, a high dose is taken until symptoms improve. Then, the dose is gradually decreased. Using corticosteroids for a long time can cause problems. They can make muscles and bones weak. They can cause blood pressure to go up, and cause diabetes. Also, people often gain weight when they take corticosteroids.  Cytotoxic drugs. These kill cells that cause inflammation. Sometimes, they are used if corticosteroids do not help. Other times, both medications are taken.  Surgery. This may be needed to repair a  blood vessel that has bulged out (aneurysm).  Treatment can sometimes cure your disease. Other times, it can put the disease in remission (no symptoms). Increased treatment and reevaluation might be necessary if your disease comes back or flares. HOME CARE INSTRUCTIONS   Take any medications that your caregiver prescribes. Follow the directions carefully.  Watch for any problems that can be caused by a drug (side effects). Tell your caregiver right away if you notice any  changes or problems.  Keep all appointments for checkups. This is important to help your caregiver watch for side effects. Checkups may include:  Periodic blood tests.  Bone density testing. This checks how strong or weak your bones are.  Blood pressure checks. If your blood pressure rises, you may need to take a drug to control it while you are taking corticosteroids.  Blood sugar checks. This is to be sure you are not developing diabetes. If you have diabetes, corticosteroid medications may make it worse and require increased treatment.  Exercise. First, talk with your caregiver about what would be OK for you to do. Aerobic exercise (which increases your heart rate) is often suggested. It includes walking. This type of exercise is good because it helps prevent bone loss. It also helps control your blood pressure.  Follow a healthy diet. Include good sources of protein in your diet. Also include fruits, vegetables and whole grains. Your caregiver can refer you to an expert on healthy eating (dietitian) for more detailed advice.  Learn as much as you can about vasculitis. Understanding your condition can help you cope with it. Coping can be hard because this may be something you will have to live with for years.  Consider joining a support group. It often helps to talk about your worries with others who have the same problems.  Tell your caregiver if you feel stressed, anxious or depressed. Your caregiver may refer you to  a specialist, or recommend medication to relieve your symptoms. SEEK MEDICAL CARE IF:   The symptoms that led to your diagnosis return.  You develop worsening fever, fatigue, headache, weight loss or pain in your jaw.  You develop signs of infection. Infections can be worse if you are on corticosteroid medication.  You develop any new or unexplained symptoms of disease. SEEK IMMEDIATE MEDICAL CARE IF:   Your eyesight changes.  Pain does not go away, even after taking medication.  You feel pain in your chest or abdomen.  You have trouble breathing.  One side of your face or body becomes suddenly weak or numb.  Your nose bleeds.  There is blood in your urine.  You develop a fever of more than 102 F (38.9 C). Document Released: 07/18/2009 Document Revised: 12/14/2011 Document Reviewed: 07/18/2009 [facility] Patient Information 2014 [place].

## 2013-10-17 NOTE — Progress Notes (Signed)
NUTRITION FOLLOW-UP  DOCUMENTATION CODES Per approved criteria  -Severe malnutrition in the context of chronic illness -Underweight   INTERVENTION: Decrease Ensure Complete to po daily, each supplement provides 350 kcal and 13 grams of protein Continue Prostat liquid protein po BID with meals, each supplement provides 100 kcal, 15 grams protein RD to follow for nutrition care plan  NUTRITION DIAGNOSIS: Malnutrition related to chronic illness as evidenced by severe fat and muscle wasting. Ongoing.  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  Pt expressed suicidal thoughts to CSW on 1/11, and pt subsequently placed on suicide precautions. Psych evaluated pt and recommended inpatient pysch hospitalization once pt is medically stable.  Per chart, pt's vasculitis is medically stable - no concern for infection at this time.  Continues on a Regular diet. Pt with variable intake, however she has been consuming 100% for the last two meals. Also ordered for Ensure Complete PO BID and Prostat PO BID. She is drinking the Ensure but is tired of it, encouraged her to drink daily, will decrease to daily. Pt agreeable.  Patient continues to meet criteria for severe malnutrition in the context of chronic illness as evidenced by severe muscle loss and severe subcutaneous fat loss.  Height: Ht Readings from Last 1 Encounters:  10/13/13 5' 2"  (1.575 m)    Weight: Wt Readings from Last 1 Encounters:  10/14/13 97 lb 11.2 oz (44.316 kg)  Admit wt 100 lb  BMI:  Body mass index is 17.86 kg/(m^2). 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 amantadine  100 mg Oral BID  . buPROPion  100 mg Oral BID  . docusate sodium  100 mg Oral BID  . feeding supplement (ENSURE COMPLETE)  237 mL Oral BID BM  . feeding supplement (PRO-STAT SUGAR FREE 64)  30 mL Oral BID WC  . heparin  5,000 Units Subcutaneous Q8H  . mirtazapine  7.5 mg Oral QHS    Continuous Infusions:   Inda Coke MS, RD, LDN Pager: [phone] After-hours pager: [phone]

## 2013-10-17 NOTE — Clinical Social Work Psych Note (Addendum)
3:07pm- Pt signed voluntary form.  Psych CSW witness.  Psych CSW faxed voluntary form to [facility].  Confirmation received.  Original will accompany pt to [facility].  RN updated.    2:47pm- Psych CSW was notified that [facility] accepted pt to bed 501-2.  Psych CSW updated MD and RN.  [facility] will be ready for pt to transfer anytime after 4pm.  RN to call for transport: [facility] [phone] RN to call for report: [facility] [phone] Voluntary form on chart to accompany pt to [facility]   1:32pm- Psych CSW contacted MD who states pt ambulates independently and performs ADLs independently.  MD documented necessary information for [facility] to reconsider accepting pt.  Charge RN at [facility] reports he will review the pt again with Dr. Louretta Shorten who together will make a decision re: acceptance.  Psych CSW will continue to update MD.  1:12pm - Psych CSW contacted [facility] to f/u on referral.  Pt is denied at this time due to lack of ability to ambulate independently.  Psych CSW will inform RN and MD.  Nonnie Done, [place] [phone]  Clinical Social Work

## 2013-10-17 NOTE — Clinical Social Work Psych Note (Signed)
Psych CSW referred pt to [facility] per Dr. Barnetta Hammersmith documentation on 10/16/2013 pt will need inpatient psychiatric treatment upon dc.  Pt is agreeable.  Psych CSW updated RNCM, unit CSW, RN and MD.  Full assessment to follow.  Nonnie Done, [place] [phone]  Clinical Social Work

## 2013-10-18 NOTE — [facility] Group Notes (Signed)
[facility] LCSW Group Therapy  Emotional Regulation 1:15 - 2: 30 PM        10/18/2013  3:42 PM   Type of Therapy:  Group Therapy  Participation Level:  Appropriate  Participation Quality:  Appropriate  Affect:  Depressed, Tearful  Cognitive:  Attentive Appropriate  Insight:  Developing/Improving Engaged  Engagement in Therapy:  Developing/Improving Engaged  Modes of Intervention:  Discussion Exploration Problem-Solving Supportive  Summary of Progress/Problems:  Group topic was emotional regulations.  Patient participated in the discussion and was able to identify an emotion that needed to regulated. She shared she deals with loneliness from not having a relationship with her family.  Patient encouraged to get involved in activities in the community that will allow her to meet positive people and build a support group.    Concha Pyo 10/18/2013 3:42 PM

## 2013-10-18 NOTE — Progress Notes (Signed)
D: Patient in the hallway on approach.  Patient was tearful stating her dressings needed to be changed on her left upper leg.  Patient states she had a good day but states she is depressed.  Patient states she has not seen her family in 91 years and states she misses them.  Patient states she is unable to contact her family because she does not know their phone number or where they live.  Patient states she is passive SI but verbally contracts for safety.  Patient denies HI and denies AVH. A: Staff to monitor Q 15 mins for safety.  Encouragement and support offered.  Scheduled medications administered per orders. R: Patient remains safe on the unit.  Patient attended group tonight.  Patient visible on the unit.  Patient taking administered medications.

## 2013-10-18 NOTE — Discharge Summary (Signed)
I saw [name] on day of discharge and assisted in the discharge planning.

## 2013-10-18 NOTE — Tx Team (Signed)
Interdisciplinary Treatment Plan Update (Adult)  Date: 10/18/2013  Time Reviewed:  9:45 AM  Progress in Treatment: Attending groups: Yes Participating in groups:  Yes Taking medication as prescribed:  Yes Tolerating medication:  Yes Family/Significant othe contact made: CSW assessing  Patient understands diagnosis:  Yes Discussing patient identified problems/goals with staff:  Yes Medical problems stabilized or resolved:  Yes Denies suicidal/homicidal ideation: Yes Issues/concerns per patient self-inventory:  Yes Other:  New problem(s) identified: N/A  Discharge Plan or Barriers: CSW assessing for appropriate referrals.  Reason for Continuation of Hospitalization: Anxiety Depression Medication Stabilization  Comments: N/A  Estimated length of stay: 3-5 days  For review of initial/current patient goals, please see plan of care.  Attendees: Patient:     Family:     Physician:  Dr. Zorita Pang 10/18/2013 9:57 AM   Nursing:   Para March, RN 10/18/2013 9:57 AM   Clinical Social Worker:  [name], LCSW 10/18/2013 9:57 AM   Other: Catalina Pizza, PA 10/18/2013 9:57 AM   Other:  Tammi Klippel, PA student 10/18/2013 9:57 AM   Other:  Joette Catching, LCSW 10/18/2013 9:57 AM   Other:  Gaylan Gerold, RN 10/18/2013 9:58 AM   Other:    Other:    Other:    Other:    Other:    Other:     Scribe for Treatment Team:   Ane Payment, 10/18/2013 9:57 AM

## 2013-10-18 NOTE — Progress Notes (Signed)
D: Patient presents with anxious affect and mood. She reported on the self inventory sheet that she's sleeping fair, appetite is improving, energy level is normal and ability to pay attention is good. Patient rated depression "6" and feelings of hopelessness "4". She's attending a few groups, but not the majority. Patient complained of leg pain 8/10. Compliant with current medication regimen.  A: Support and encouragement provided to patient. Administered scheduled medications per ordering MD. PRN Oxycodone given for pain. Monitor Q15 minute checks for safety.  R: Patient receptive. Denies SI/HI/AVH. Patient remains safe on the unit.

## 2013-10-18 NOTE — BHH Suicide Risk Assessment (Signed)
[facility] Adult Inpatient Family/Significant Other Suicide Prevention Education  Suicide Prevention Education:   Patient Refusal for Family/Significant Other Suicide Prevention Education: The patient has refused to provide written consent for family/significant other to be provided Family/Significant Other Suicide Prevention Education during admission and/or prior to discharge.  Physician notified.  CSW provided suicide prevention information with patient.    The suicide prevention education provided includes the following:  Suicide risk factors  Suicide prevention and interventions  National Suicide Hotline telephone number  [facility] assessment telephone number  [facility] Emergency Assistance [place] and/or Residential Mobile Crisis Unit telephone number   [name], [place] 10/18/2013 9:07 AM

## 2013-10-18 NOTE — BHH Counselor (Signed)
Adult Psychosocial Assessment Update Interdisciplinary Team  Previous [facility] admissions/discharges:  Admissions Discharges  Date: 08/30/13 Date: 09/05/13  Date: 06/23/13 Date: 06/28/13  Date: 05/14/13 Date: 05/17/13  Date: Date:  Date: Date:   Changes since the last Psychosocial Assessment (including adherence to outpatient mental health and/or substance abuse treatment, situational issues contributing to decompensation and/or relapse). Pt states that since last admission she has not followed up at [facility] for outpatient treatment because she becomes sick or has pain and doesn't ever go.  Pt is non compliant with follow up.  Pt was previously homeless and now has an apartment in [place] and can return home there. Pt continues to use cocaine.             Discharge Plan 1. Will you be returning to the same living situation after discharge?   Yes:  X  Pt will return to own place in [place] No:      If no, what is your plan?           2. Would you like a referral for services when you are discharged? Yes:  X   If yes, for what services? CSW will refer pt to [facility] for medication management and therapy  No:              Summary and Recommendations (to be completed by the evaluator) Patient is a 53 year old African American female with a diagnosis of Major Depressive Disorder, Recurrent.  Patient lives in [place] alone.  Patient will benefit from crisis stabilization, medication evaluation, group therapy and psycho education in addition to case management for discharge planning.                         Signature:  Ane Payment, 10/18/2013 9:35 AM

## 2013-10-18 NOTE — Progress Notes (Signed)
Recreation Therapy Notes  Date: 01.14.2015 Time: 3:00 Location: [address]   Group Topic: Communication, Team Building, Problem Solving  Goal Area(s) Addresses:  Patient will effectively work with peer towards shared goal.  Patient will identify skill used to make activity successful.  Patient will identify how skills used during activity can be used to reach post d/c goals.   Behavioral Response: Did not attend.   Laureen Ochs Treacy Holcomb, LRT/CTRS  Murrel Freet L 10/18/2013 5:05 PM

## 2013-10-18 NOTE — BHH Group Notes (Signed)
[facility] LCSW Aftercare Discharge Planning Group Note   10/18/2013 8:45 AM  Participation Quality:  Alert, Appropriate and Oriented  Mood/Affect:  Flat and Depressed  Depression Rating:  5  Anxiety Rating:  5  Thoughts of Suicide:  Pt denies SI/HI  Will you contract for safety?   Yes  Current AVH:  Pt denies  Plan for Discharge/Comments:  Pt attended discharge planning group and actively participated in group.  CSW provided pt with today's workbook.  Pt reports coming to the hospital for SI and depression.  Pt states that she also has pain.  Pt has own place in [place] and can return home there.  Pt will follow up at [facility] for medication management and therapy.  No further needs voiced by pt at this time.    Transportation Means: Pt reports access to transportation - provided pt with a bus pass  Supports: No supports mentioned at this time  [name], [place] 10/18/2013 9:33 AM

## 2013-10-18 NOTE — BHH Group Notes (Signed)
Adult Psychoeducational Group Note  Date:  10/18/2013 Time:  10:26 PM  Group Topic/Focus:  Wrap-Up Group:   The focus of this group is to help patients review their daily goal of treatment and discuss progress on daily workbooks.  Participation Level:  Active  Participation Quality:  Appropriate  Affect:  Flat  Cognitive:  Appropriate  Insight: Appropriate  Engagement in Group:  Engaged  Modes of Intervention:  Discussion  Additional Comments:  Saraiah stated her day was depressing from talking about her family and emotions in previous groups.  She said would deal with feeling depressed by laying down.  She said she is looking forward to a new life once discharged.  [name] A 10/18/2013, 10:26 PM

## 2013-10-18 NOTE — BHH Suicide Risk Assessment (Signed)
Suicide Risk Assessment  Admission Assessment     Nursing information obtained from:    Demographic factors:    Current Mental Status:    Loss Factors:    Historical Factors:    Risk Reduction Factors:     CLINICAL FACTORS:   Depression:   Anhedonia Comorbid alcohol abuse/dependence Hopelessness Impulsivity Insomnia Recent sense of peace/wellbeing Severe Alcohol/Substance Abuse/Dependencies Chronic Pain Unstable or Poor Therapeutic Relationship Previous Psychiatric Diagnoses and Treatments Medical Diagnoses and Treatments/Surgeries  COGNITIVE FEATURES THAT CONTRIBUTE TO RISK:  Closed-mindedness Loss of executive function Polarized thinking    SUICIDE RISK:   Moderate:  Frequent suicidal ideation with limited intensity, and duration, some specificity in terms of plans, no associated intent, good self-control, limited dysphoria/symptomatology, some risk factors present, and identifiable protective factors, including available and accessible social support.  PLAN OF CARE: Admit for crisis stabilization, safety monitoring and medication management of substance induced mood disorder. 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  I certify that inpatient services furnished can reasonably be expected to improve the patient's condition.  Suann Klier,JANARDHAHA R. 10/18/2013, 12:20 PM

## 2013-10-19 NOTE — Progress Notes (Signed)
Adult Psychoeducational Group Note  Date:  10/19/2013 Time:  5:05 PM  Group Topic/Focus:  Rediscovering Joy:   The focus of this group is to explore various ways to relieve stress in a positive manner and how to rediscover joy and laughter in their life.  Participation Level:  Active  Participation Quality:  Appropriate  Affect:  Appropriate  Cognitive:  Appropriate  Insight: Appropriate  Engagement in Group:  Engaged  Modes of Intervention:  Discussion and Support  Additional Comments:  Pts discussed things that bring them joy and make them laugh. Pt stated she enjoys walking and needs to do more walking to increase joy.  Clint Bolder 10/19/2013, 5:05 PM

## 2013-10-19 NOTE — Progress Notes (Signed)
The focus of this group is to educate the patient on the purpose and policies of crisis stabilization and provide a format to answer questions about their admission.  The group details unit policies and expectations of patients while admitted.  Patient attended 0900 nurse education orientation group this morning.  Patient participated, appropriate affect, alert, appropriate insight and engagement.  Today patient will work on 3 goals for discharge.

## 2013-10-19 NOTE — Progress Notes (Signed)
Adult Psychoeducational Group Note  Date:  10/19/2013 Time:  10:13 PM  Group Topic/Focus:  Goals Group:   The focus of this group is to help patients establish daily goals to achieve during treatment and discuss how the patient can incorporate goal setting into their daily lives to aide in recovery.  Participation Level:  Active  Participation Quality:  Appropriate  Affect:  Appropriate  Cognitive:  Appropriate  Insight: Appropriate  Engagement in Group:  Engaged  Modes of Intervention:  Discussion  Additional Comments:  Pt stated that she feels good, the past two days have been better and she looks forward to seeing what tomorrow has in store.  Alexis Goodell R 10/19/2013, 10:13 PM

## 2013-10-19 NOTE — BHH Group Notes (Signed)
[place] LCSW Group Therapy  10/19/2013  1:15 PM   Type of Therapy:  Group Therapy  Participation Level:  Active  Participation Quality:  Attentive, Sharing and Supportive  Affect:  Depressed and Flat  Cognitive:  Alert and Oriented  Insight:  Developing/Improving and Engaged  Engagement in Therapy:  Developing/Improving and Engaged  Modes of Intervention:  Activity, Clarification, Confrontation, Discussion, Education, Exploration, Limit-setting, Orientation, Problem-solving, Rapport Building, Art therapist, Socialization and Support  Summary of Progress/Problems: Patient was attentive and engaged with speaker from [place].  Patient was attentive to speaker while they shared their story of dealing with mental health and overcoming it.  Patient expressed interest in their programs and services and received information on their agency.  Patient processed ways they can relate to the speaker.     [name], LCSW 10/19/2013 1:43 PM

## 2013-10-19 NOTE — Progress Notes (Signed)
D: Patient appropriate and cooperative with staff and peers. Patient's has depressed mood and affect. She reported on the self inventory sheet that she's sleeping fair, appetite is good, energy level is normal and ability to pay attention is improving. Patient rated depression "10" and feelings of hopelessness "9". She's visible and interactive with peers in the milieu. Continues to be compliant with medications.  A: Support and encouragement provided to patient. Scheduled medications administered per MD orders. Maintain Q15 minute checks for safety.   R: Patient receptive. Passive SI, but contracts for safety. Denies HI and auditory/visual hallucinations. Patient remains safe.

## 2013-10-19 NOTE — Progress Notes (Signed)
[facility] MD Progress Note  10/19/2013 6:38 PM [name]  MRN:  [id number] Subjective:   [name] is an 53 y.o. Female known to Dr. Louretta Shorten from her previous 2 acute psychiatric hospitalization within the last 6 months for 4 cocaine abuse versus dependence and also substance induced mood disorder. Patient was admitted to the [facility] levamisole-induced skin necrosis, depression, normocytic anemia, and RA. Patient has been staying with their roommate/friend otherwise she was to homeless. She smokes 2-3 cigs/day for 42 years. She is "trying to" stay away from cocaine.   Pt affirms SI with a plan to "cust wrists" and states that she is "tired of life". Reports poor sleep, but good appetite (addressed). Pt was crying throughout the assessment. Rates continues to minimize anxiety symptoms, but does appear visibly anxious and rates depression 5/10. Denies AVH; denies physical complaints. Pt also mentions that she has only used "cocaine twice since Dec 2014". There are vasculitis sores to bilateral ears approximately 1cm in length and large weeping vasculitis wounds (with hx of debridement with [place] in December), currently covered with Aquacell. Will change dsg as soon as supplies arrive and will consider consultation with wound care if they do not appear to be healing well. Pt is in agreement with current medication regimen and treatment plan.   During today's assessment, pt states she is feeling "increased depression today" and anxiety continues to remain high compared to yesterday's assessment. Pt unable to give numerical rating. Pt stated caring for her vasculitis wounds are causing her to feel more depressed today, but she was happy that nurse came and cleaned her wound and changed dressings this afternoon. Pt reports the wound is "still weeping blood".  Pt currently denies SI, HI, and AVH. Pt reported some sleep disturbances, but a good appetite. Pt also c/o feeling hot  and cold sweats intermittently.    Diagnosis:    DSM5: Substance/Addictive Disorders:  Opioid Disorder - Severe (304.00) Depressive Disorders:  Major Depressive Disorder - Severe (296.23)  Axis I: Major Depression, Recurrent severe and Substance Abuse Axis II: Deferred Axis III:  Past Medical History  Diagnosis Date  . 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 Followed by Dr. Louanne Skye  . Hypertension   . Cocaine abuse     ongoing with resultant vaculitis.  . Tobacco abuse   . Depression   . Normocytic anemia     BL Hgb 9.8-12. Last anemia panel 04/2010 - showing Fe 19, ferritin 101.  Pt on monthly B12 injections  . Rheumatoid arthritis(714.0)     patient reported  . Inflammatory arthritis   . Headache(784.0)    Axis IV: economic problems, housing problems, other psychosocial or environmental problems, problems related to social environment, problems with access to health care services and problems with primary support group Axis V: 41-50 serious symptoms  ADL's:  Intact  Sleep: Fair  Appetite:  Good  Suicidal Ideation:  Denies Homicidal Ideation:  Denies  AEB (as evidenced by):  Psychiatric Specialty Exam: Review of Systems  Constitutional: Negative.   HENT: Negative.   Eyes: Negative.   Respiratory: Negative.   Cardiovascular: Negative.   Gastrointestinal: Negative.   Genitourinary: Negative.   Musculoskeletal: Positive for myalgias (L leg pain due to cellulitits).  Skin: Negative.   Neurological: Negative.   Endo/Heme/Allergies: Negative.   Psychiatric/Behavioral: Positive for depression (8/10). Negative for suicidal ideas, hallucinations and substance abuse. The patient does not have insomnia. Nervous/anxious: 8/10.     Blood  pressure 105/58, pulse 79, temperature 97.5 F (36.4 C), temperature source Oral, resp. rate 16, height 5' 1.5" (1.562 m), weight 45.36 kg (100 lb).Body mass index is 18.59 kg/(m^2).  General Appearance: Casual and Fairly Groomed  Chemical engineer::  Fair  Speech:  Pressured  Volume:  Normal  Mood:  Anxious and Depressed  Affect:  Depressed and Flat  Thought Process:  Intact  Orientation:  Full (Time, Place, and Person)  Thought Content:  WDL  Suicidal Thoughts:  No  Homicidal Thoughts:  No  Memory:  Immediate;   Good Recent;   Good Remote;   Good  Judgement:  Fair  Insight:  Fair  Psychomotor Activity:  Normal  Concentration:  Fair  Recall:  Good  Akathisia:  No  Handed:  Right  AIMS (if indicated):     Assets:  Desire for Improvement Resilience  Sleep:  Number of Hours: 6.25   Current Medications: Current Facility-Administered Medications  Medication Dose Route Frequency Provider Last Rate Last Dose  . alum & mag hydroxide-simeth (MAALOX/MYLANTA) 200-200-20 MG/5ML suspension 30 mL  30 mL Oral Q4H PRN Benjamine Mola, FNP      . amantadine (SYMMETREL) capsule 100 mg  100 mg Oral BID Benjamine Mola, FNP   100 mg at 10/19/13 1615  . buPROPion [facility]) tablet 100 mg  100 mg Oral BID Benjamine Mola, FNP   100 mg at 10/19/13 1615  . hydrOXYzine (ATARAX/VISTARIL) tablet 25 mg  25 mg Oral Q6H PRN Benjamine Mola, FNP      . LORazepam (ATIVAN) tablet 0.5 mg  0.5 mg Oral BID PRN Benjamine Mola, FNP      . magnesium hydroxide (MILK OF MAGNESIA) suspension 30 mL  30 mL Oral Daily PRN Benjamine Mola, FNP      . mirtazapine (REMERON) tablet 7.5 mg  7.5 mg Oral QHS Benjamine Mola, FNP   7.5 mg at 10/18/13 2114  . nicotine (NICODERM CQ - dosed in mg/24 hours) patch 14 mg  14 mg Transdermal Q0600 Benjamine Mola, FNP   14 mg at 10/19/13 2376  . oxyCODONE (Oxy IR/ROXICODONE) immediate release tablet 5 mg  5 mg Oral Q4H PRN Benjamine Mola, FNP   5 mg at 10/19/13 1424  . traZODone (DESYREL) tablet 50 mg  50 mg Oral QHS Benjamine Mola, FNP   50 mg at 10/18/13 2114    Lab Results: No results found for this or any previous visit (from the past 48 hour(s)).  Physical Findings: AIMS: Facial and Oral Movements Muscles of Facial  Expression: None, normal Lips and Perioral Area: None, normal Jaw: None, normal Tongue: None, normal,Extremity Movements Upper (arms, wrists, hands, fingers): None, normal Lower (legs, knees, ankles, toes): None, normal, Trunk Movements Neck, shoulders, hips: None, normal, Overall Severity Severity of abnormal movements (highest score from questions above): None, normal Incapacitation due to abnormal movements: None, normal Patient's awareness of abnormal movements (rate only patient's report): No Awareness, Dental Status Current problems with teeth and/or dentures?: Yes Does patient usually wear dentures?: Yes  CIWA:    COWS:     Treatment Plan Summary: Daily contact with patient to assess and evaluate symptoms and progress in treatment Medication management  Plan: Review of chart, vital signs, medications, and notes.  1-Individual and group therapy  2-Medication management for depression and anxiety: Medications reviewed with the patient and she stated no untoward effects, unchanged. 3-Coping skills for depression, anxiety  4-Continue crisis stabilization and management  5-Address health issues--monitoring vital signs, stable  6-Treatment plan in progress to prevent relapse of depression and anxiety  Medical Decision Making Problem Points:  Established problem, worsening (2) and Review of psycho-social stressors (1) Data Points:  Review and summation of old records (2) Review of medication regiment & side effects (2) Review of new medications or change in dosage (2)  I certify that inpatient services furnished can reasonably be expected to improve the patient's condition.   Benjamine Mola, FNP-BC 10/19/2013, 6:38 PM  Reviewed the information documented and agree with the treatment plan.  Trae Bovenzi,JANARDHAHA R. 10/20/2013 6:43 PM

## 2013-10-19 NOTE — Progress Notes (Signed)
Recreation Therapy Notes  Animal-Assisted Activity/Therapy (AAA/T) Program Checklist/Progress Notes Patient Eligibility Criteria Checklist & Daily Group note for Rec Tx Intervention  Date: 01.15.2015 Time: 2:45pm Location: [address] day[address]    AAA/T Program Assumption of Risk Form signed by Patient/ or Parent Legal Guardian yes  Patient is free of allergies or sever asthma yes  Patient reports no fear of animals yes  Patient reports no history of cruelty to animals yes   Patient understands his/her participation is voluntary yes  Behavioral Response: Did not attend.    Laureen Ochs Kielyn Kardell, LRT/CTRS  Lane Hacker 10/19/2013 4:37 PM

## 2013-10-20 MED ORDER — OXYCODONE HCL 5 MG PO TABS
10.0000 mg | ORAL_TABLET | ORAL | Status: DC | PRN
  Administered 2013-10-20 – 2013-10-23 (×11): 10 mg via ORAL
  Filled 2013-10-20 (×11): qty 2

## 2013-10-20 NOTE — Progress Notes (Signed)
[name], female   DOB: [birth year], 53 y.o.   MRN: [id number] [facility] MD Progress Note  10/20/2013 12:37 PM [name]  MRN:  [id number] Subjective:   [name] is seen today and chart reviewed. Patient hasn't been suffering with a vasculitis and the ounces on her both thighs and requesting better pain management patient reported that she has been more depressed anxious, tearful and unable to sleep since yesterday. Patient reported her pain medication is not helping her much. Patient is known to this relative from her previous 2 acute psychiatric hospitalization within the last 6 months for 4 cocaine abuse versus dependence and also substance induced mood disorder. Patient currently denies SI, HI, and AVH. Pt reported some sleep disturbances, but a good appetite.  Diagnosis:    DSM5: Substance/Addictive Disorders:  Opioid Disorder - Severe (304.00) Depressive Disorders:  Major Depressive Disorder - Severe (296.23)  Axis I: Major Depression, Recurrent severe and Substance Abuse Axis II: Deferred Axis III:  Past Medical History  Diagnosis Date  . 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 Followed by Dr. Louanne Cristina  . Hypertension   . Cocaine abuse     ongoing with resultant vaculitis.  . Tobacco abuse   . Depression   . Normocytic anemia     BL Hgb 9.8-12. Last anemia panel 04/2010 - showing Fe 19, ferritin 101.  Pt on monthly B12 injections  . Rheumatoid arthritis(714.0)     patient reported  . Inflammatory arthritis   . Headache(784.0)    Axis IV: economic problems, housing problems, other psychosocial or environmental problems, problems related to social environment, problems with access to health care services and problems with primary support group Axis V: 41-50 serious symptoms  ADL's:  Intact  Sleep: Fair  Appetite:  Good  Suicidal Ideation:  Denies Homicidal Ideation:  Denies  AEB (as evidenced by):  Psychiatric Specialty Exam: Review of  Systems  Constitutional: Negative.   HENT: Negative.   Eyes: Negative.   Respiratory: Negative.   Cardiovascular: Negative.   Gastrointestinal: Negative.   Genitourinary: Negative.   Musculoskeletal: Positive for myalgias (L leg pain due to cellulitits).  Skin: Negative.   Neurological: Negative.   Endo/Heme/Allergies: Negative.   Psychiatric/Behavioral: Positive for depression (8/10). Negative for suicidal ideas, hallucinations and substance abuse. The patient does not have insomnia. Nervous/anxious: 8/10.     Blood pressure 117/81, pulse 69, temperature 97.4 F (36.3 C), temperature source Oral, resp. rate 18, height 5' 1.5" (1.562 m), weight 45.36 kg (100 lb).Body mass index is 18.59 kg/(m^2).  General Appearance: Casual and Fairly Groomed  Engineer, water::  Fair  Speech:  Pressured  Volume:  Normal  Mood:  Anxious and Depressed  Affect:  Depressed and Flat  Thought Process:  Intact  Orientation:  Full (Time, Place, and Person)  Thought Content:  WDL  Suicidal Thoughts:  No  Homicidal Thoughts:  No  Memory:  Immediate;   Good Recent;   Good Remote;   Good  Judgement:  Fair  Insight:  Fair  Psychomotor Activity:  Normal  Concentration:  Fair  Recall:  Good  Akathisia:  No  Handed:  Right  AIMS (if indicated):     Assets:  Desire for Improvement Resilience  Sleep:  Number of Hours: 6.25   Current Medications: Current Facility-Administered Medications  Medication Dose Route Frequency Provider Last Rate Last Dose  . alum & mag hydroxide-simeth (MAALOX/MYLANTA) 200-200-20 MG/5ML suspension 30 mL  30 mL Oral Q4H  PRN Benjamine Mola, FNP      . amantadine (SYMMETREL) capsule 100 mg  100 mg Oral BID Benjamine Mola, FNP   100 mg at 10/20/13 2585  . buPROPion [facility]) tablet 100 mg  100 mg Oral BID Benjamine Mola, FNP   100 mg at 10/20/13 2778  . hydrOXYzine (ATARAX/VISTARIL) tablet 25 mg  25 mg Oral Q6H PRN Benjamine Mola, FNP      . LORazepam (ATIVAN) tablet 0.5 mg  0.5 mg  Oral BID PRN Benjamine Mola, FNP      . magnesium hydroxide (MILK OF MAGNESIA) suspension 30 mL  30 mL Oral Daily PRN Benjamine Mola, FNP      . mirtazapine (REMERON) tablet 7.5 mg  7.5 mg Oral QHS Benjamine Mola, FNP   7.5 mg at 10/19/13 2147  . nicotine (NICODERM CQ - dosed in mg/24 hours) patch 14 mg  14 mg Transdermal Q0600 Benjamine Mola, FNP   14 mg at 10/20/13 2423  . oxyCODONE (Oxy IR/ROXICODONE) immediate release tablet 5 mg  5 mg Oral Q4H PRN Benjamine Mola, FNP   5 mg at 10/20/13 0926  . traZODone (DESYREL) tablet 50 mg  50 mg Oral QHS Benjamine Mola, FNP   50 mg at 10/19/13 2147    Lab Results: No results found for this or any previous visit (from the past 48 hour(s)).  Physical Findings: AIMS: Facial and Oral Movements Muscles of Facial Expression: None, normal Lips and Perioral Area: None, normal Jaw: None, normal Tongue: None, normal,Extremity Movements Upper (arms, wrists, hands, fingers): None, normal Lower (legs, knees, ankles, toes): None, normal, Trunk Movements Neck, shoulders, hips: None, normal, Overall Severity Severity of abnormal movements (highest score from questions above): None, normal Incapacitation due to abnormal movements: None, normal Patient's awareness of abnormal movements (rate only patient's report): No Awareness, Dental Status Current problems with teeth and/or dentures?: Yes Does patient usually wear dentures?: Yes  CIWA:    COWS:     Treatment Plan Summary: Daily contact with patient to assess and evaluate symptoms and progress in treatment Medication management  Plan: Review of chart, vital signs, medications, and notes.  1-Individual and group therapy  2-Medication management for depression and anxiety: Medications reviewed with the patient and she stated no untoward effects. Increase oxycodone 10 mg every 4 hours as needed for pain.  3-Coping skills for depression, anxiety  4-Continue crisis stabilization and management  5-Address  health issues--monitoring vital signs, stable  6-Treatment plan in progress to prevent relapse of depression and anxiety 7. Disposition plans are in progress patient may be discharged on Monday if she continued to contract for safety unable to improve clinically  Medical Decision Making Problem Points:  Established problem, worsening (2) and Review of psycho-social stressors (1) Data Points:  Review and summation of old records (2) Review of medication regiment & side effects (2) Review of new medications or change in dosage (2)  I certify that inpatient services furnished can reasonably be expected to improve the patient's condition.   Lacresia Darwish,JANARDHAHA R. 10/20/2013, 12:37 PM

## 2013-10-20 NOTE — Progress Notes (Addendum)
D: Pt is flat in affect and depressed in mood. Pt does brighten up upon approach. Pt observed to be anxious in many of her interactions with this Probation officer. She expresses that her current prn order for oxycodone is insufficient in managing her pain. She is interested in addressing this concern with her doctor. Pt encouraged to do so. This pt has been actively participating within the milieu.  A: Writer administered scheduled and prn medications to pt. Continued support and availability as needed was extended to this pt. Staff continue to monitor pt with q74mn checks.  R: No adverse drug reactions noted. Pt receptive to treatment. Pt remains safe at this time.

## 2013-10-20 NOTE — Progress Notes (Signed)
Recreation Therapy Notes  INPATIENT RECREATION THERAPY ASSESSMENT  Patient Stressors: Friends,   Coping Skills: Substance Abuse, Exercise, Dance,   Leisure Interests: Exercise, Listening to Music, Movies, Sports, [employer], Walking,   Personal Challenges: Anger, Self-Esteem/Confidence, Stress Management, Trusting Others,   Patient aware of community resources? yes  [employer] patient aware of: [employer], [employer], [place], [employer], [place], Geneticist, molecular, Resturants, [employer]ops,   Patient indicated the following strengths:  Happy being with others.   Patient indicated interest in changing the following: My friend and play mate.  Patient currently participates in the following recreation activities: Walking  Patient goal for hospitalization: Get better  Physical Disability: yes Patient indicated she has a leg injury.   [place] of Residence: [place] of Residence: [place], LRT/CTRS  Lane Hacker 10/20/2013 4:28 PM

## 2013-10-20 NOTE — Progress Notes (Signed)
Adult Psychoeducational Group Note  Date:  10/20/2013 Time:  10:00am Group Topic/Focus:  Relapse Prevention Planning:   The focus of this group is to define relapse and discuss the need for planning to combat relapse.  Participation Level:  Active  Participation Quality:  Appropriate and Attentive  Affect:  Appropriate  Cognitive:  Alert and Appropriate  Insight: Appropriate  Engagement in Group:  Engaged  Modes of Intervention:  Discussion and Education  Additional Comments:  Pt attended and participated in group. Discussion was on recovery prevention and what would help in not relasping. Pt stated what would help her is not hanging around negative people.  Marlowe Shores D 10/20/2013, 2:52 PM

## 2013-10-20 NOTE — Progress Notes (Signed)
Recreation Therapy Notes  Date: 01.16.2015 Time: 2:45pm Location: [address]   Group Topic: Leisure Education  Goal Area(s) Addresses:  Patient will identify positive leisure activities.  Patient will identify one positive benefit of participation in leisure activities.   Behavioral Response: Engaged, Attentive, Appropriate   Intervention: Game  Activity: Leisure ABC's. As a group patients were asked to identify leisure activities to correspond with each letter of the alphabet.   Education:  Leisure Education, Radiographer, therapeutic, Discharge Planning  Education Outcome: Acknowledges understanding  Clinical Observations/Feedback: Patient actively engaged in group activity, identifying activities to correspond with letters of the alphabet. Patient contributed group discussion, identifying positive emotions associated with leisure participation.       Laureen Ochs Nuria Phebus, LRT/CTRS  Jamarii Banks L 10/20/2013 4:16 PM

## 2013-10-20 NOTE — Progress Notes (Signed)
D: Patient has depressed affect and mood. He reported on the self inventory sheet that she's sleeping well, appetite and ability to pay attention are good and energy level is normal. Patient rated depression and feelings of hopelessness "10". She's attending groups today and continues to be compliant with medications.  A: Support and encouragement provided to patient. Administered scheduled medications per ordering MD. Monitor Q15 minute checks for safety.  R: Patient receptive. Denies SI/HI/AVH. Patient remains safe on the unit.

## 2013-10-20 NOTE — Progress Notes (Signed)
Adult Psychoeducational Group Note  Date:  10/20/2013 Time:  9:51 PM  Group Topic/Focus:  Wrap-Up Group:   The focus of this group is to help patients review their daily goal of treatment and discuss progress on daily workbooks.  Participation Level:  Active  Participation Quality:  Appropriate and Attentive  Affect:  Appropriate  Cognitive:  Alert and Appropriate  Insight: Appropriate  Engagement in Group:  Engaged  Modes of Intervention:  Discussion, Education, Socialization and Support  Additional Comments:  Pt was active during this group. Pt rated her day at a 10 because of the pain she was having on her legs. Pt said her day went well and she felt good, besides the fact that she was in pain.  Rocky Crafts 10/20/2013, 9:51 PM

## 2013-10-20 NOTE — Tx Team (Signed)
Interdisciplinary Treatment Plan Update (Adult)  Date: 10/20/2013  Time Reviewed:  9:45 AM  Progress in Treatment: Attending groups: Yes Participating in groups:  Yes Taking medication as prescribed:  Yes Tolerating medication:  Yes Family/Significant othe contact made: No, pt refused  Patient understands diagnosis:  Yes Discussing patient identified problems/goals with staff:  Yes Medical problems stabilized or resolved:  Yes Denies suicidal/homicidal ideation: Yes Issues/concerns per patient self-inventory:  Yes Other:  New problem(s) identified: N/A  Discharge Plan or Barriers: CSW will follow up at [facility] for medication management and therapy.    Reason for Continuation of Hospitalization: Anxiety Depression Medication Stabilization  Comments: N/A  Estimated length of stay: 3 days  For review of initial/current patient goals, please see plan of care.  Attendees: Patient:     Family:     Physician:  Dr. Zorita Pang 10/20/2013 10:12 AM   Nursing:   Grayland Ormond, RN 10/20/2013 10:12 AM   Clinical Social Worker:  [name], LCSW 10/20/2013 10:12 AM   Other:  10/20/2013 10:12 AM   Other:  Gala Romney, care coordination 10/20/2013 10:12 AM   Other:  Joette Catching, LCSW 10/20/2013 10:12 AM   Other:     Other:    Other:    Other:    Other:    Other:    Other:     Scribe for Treatment Team:   Ane Payment, 10/20/2013 10:12 AM

## 2013-10-20 NOTE — BHH Group Notes (Signed)
[place] LCSW Group Therapy  10/20/2013  1:15 PM   Type of Therapy:  Group Therapy  Participation Level:  Active  Participation Quality:  Appropriate, Sharing and Supportive  Affect:  Depressed and Tearful  Cognitive:  Alert and Oriented  Insight:  Developing/Improving and Engaged  Engagement in Therapy:  Developing/Improving and Engaged  Modes of Intervention:  Clarification, Confrontation, Discussion, Education, Exploration, Limit-setting, Orientation, Problem-solving, Rapport Building, Art therapist, Socialization and Support  Summary of Progress/Problems: The topic for today was feelings about relapse.  Pt discussed what relapse prevention is to them and identified triggers that they are on the path to relapse.  Pt processed their feeling towards relapse and was able to relate to peers.  Pt discussed coping skills that can be used for relapse prevention.   Pt shared that relapse for her is scary, as she has to go back to the same environment.  Pt states that she knows she has to find new, positive friends, go to meetings and keep her appointments.  Pt was tearful throughout group, stating that she was in pain and is sad about her situation.  Pt actively participated and was engaged in group discussion, as well as supportive to peers.    [name], LCSW 10/20/2013 2:14 PM

## 2013-10-20 NOTE — BHH Group Notes (Signed)
[facility] LCSW Aftercare Discharge Planning Group Note   10/20/2013 8:45 AM  Participation Quality:  Alert, Appropriate and Oriented  Mood/Affect:  Tearful and Depressed  Depression Rating:  10  Anxiety Rating:  10  Thoughts of Suicide:  Pt states "I don't know" when asked about SI/HI  Will you contract for safety?   Yes  Current AVH:  Pt denies  Plan for Discharge/Comments:  Pt attended discharge planning group and actively participated in group.  CSW provided pt with today's workbook.  Pt was tearful today and states she is not doing well today, stating that she is in a lot of pain today and feels nothing is being done about it.  Pt can stay with a friend in [place] and can return home there.  Pt will follow up at [facility] for medication management and therapy.  No further needs voiced by pt at this time.    Transportation Means: Pt reports access to transportation - provided pt with a bus pass  Supports: No supports mentioned at this time  [name], LCSW 10/20/2013 10:00 AM

## 2013-10-21 DIAGNOSIS — F1910 Other psychoactive substance abuse, uncomplicated: Secondary | ICD-10-CM

## 2013-10-21 DIAGNOSIS — R45851 Suicidal ideations: Secondary | ICD-10-CM

## 2013-10-21 DIAGNOSIS — F332 Major depressive disorder, recurrent severe without psychotic features: Principal | ICD-10-CM

## 2013-10-21 MED ORDER — CEPHALEXIN 500 MG PO CAPS
500.0000 mg | ORAL_CAPSULE | Freq: Three times a day (TID) | ORAL | Status: DC
  Administered 2013-10-21 – 2013-10-23 (×7): 500 mg via ORAL
  Filled 2013-10-21 (×2): qty 1
  Filled 2013-10-21: qty 9
  Filled 2013-10-21 (×4): qty 1
  Filled 2013-10-21: qty 9
  Filled 2013-10-21 (×2): qty 1
  Filled 2013-10-21: qty 9
  Filled 2013-10-21: qty 1

## 2013-10-21 NOTE — BHH Group Notes (Signed)
[place] Group Notes:  (Clinical Social Work)  10/21/2013   3:00-4:00PM  Summary of Progress/Problems:   The main focus of today's process group was for the patient to identify ways in which they have sabotaged their own mental health wellness/recovery.  Motivational interviewing and a handout were used to explore the benefits and costs of their self-sabotaging behavior as well as the benefits and costs of changing this behavior.  The Stages of Change were explained to the group using a handout, and patients identified where they are with regard to changing self-defeating behaviors.  The patient expressed she self-sabotages with people pleasing.  She listened throughout group, spoke a little, but mostly listened and laughed at some of the things that were said.  Type of Therapy:  Process Group  Participation Level:  Active  Participation Quality:  Attentive  Affect:  Appropriate  Cognitive:  Appropriate  Insight:  Developing/Improving  Engagement in Therapy:  Engaged  Modes of Intervention:  Education, Motivational Interviewing   Selmer Dominion, LCSW 10/21/2013, 4:00pm

## 2013-10-21 NOTE — Progress Notes (Signed)
[place] Group Notes:  (Nursing/MHT/Case Management/Adjunct)  Date:  10/21/2013  Time:  9:47 PM  Type of Therapy:  Group Therapy  Participation Level:  Active  Participation Quality:  Appropriate  Affect:  Appropriate  Cognitive:  Appropriate  Insight:  Appropriate  Engagement in Group:  Engaged  Modes of Intervention:  Discussion  Summary of Progress/Problems:The pt expressed that she slept all day and did not attend group. The pt said that she had a good day.  Nash Shearer 10/21/2013, 9:47 PM

## 2013-10-21 NOTE — Progress Notes (Signed)
Psychoeducational Group Note  Date: 10/21/2013 Time: 0930  Group Topic/Focus:  Identifying Needs:   The focus of this group is to help patients identify their personal needs that have been historically problematic and identify healthy behaviors to address their needs.  Participation Level:  Pt did not attend  Participation Quality:    Affect:    Cognitive:    Insight:    Engagement in Group:    Additional Comments:    1/17/201510:32 AM Jahnia Hewes, Trixie Rude

## 2013-10-21 NOTE — Progress Notes (Signed)
[name] spent most of the day in her bed, sleeping. She does not atend groups, nor is she interested in processing and trying to learn healthy coping skills.   A This nurse observed patient perform wound care to open round circle-like wounds on tops of her thighs this pm ( she took shower with old dressings on, then removed old dressings, cleaned wound edges with betadine-soaked gauze and then applied silicone gel adhesive dressings to each wopund. No oobvious drainage but edges and center of wouldn beds are runny with serosanguinoues drainage . \  R Pt cont on po keflex. Per pt's self inventory, her depression and hopelessness are "9/6, respectively" and she writes [name].

## 2013-10-21 NOTE — Progress Notes (Signed)
Writer has observed patient up in the dayroom watching tv and interacting with peers appropriately. Patient is complaint with medications and complains of pain from her sores on her leg. Patient is aware of time next medication is due and is willing to wait for her next dose. She is given a prn visteril to aid with her anxiety. Patient attended group this evening and has been in a cheerful mood. Encouraged patient to continue to attend groups  Which she plans to do. Safety maintained on unit with 15 min checks.

## 2013-10-21 NOTE — Progress Notes (Signed)
Psychoeducational Group Note  Date:  10/21/2013 Time:  1030 Group Topic/Focus:  Identifying Needs:   The focus of this group is to help patients identify their personal needs that have been historically problematic and identify healthy behaviors to address their needs.  Participation Level:  Did Not Attend  Participation Quality:    Affect:     Cognitive:    Insight:    Engagement in Group:    Additional Comments:  10/21/2013,3:53 PM Avenir Lozinski, Trixie Rude

## 2013-10-21 NOTE — Progress Notes (Signed)
[name], female   DOB: 03/14/1961, 53 y.o.   MRN: [id number] [facility] MD Progress Note  10/21/2013 11:50 AM Elanah Osmanovic  MRN:  [id number] Subjective:   Patient was lying in bed and forwarded little information on assessment.  She did acknowledge that her sleep and appetite are "good", 8/10 depression with suicidal ideations, denies withdrawal symptoms.  Poor eye contact, answered questions appropriately, depressed mood and congruent affect.  Diagnosis:    DSM5: Substance/Addictive Disorders:  Opioid Disorder - Severe (304.00) Depressive Disorders:  Major Depressive Disorder - Severe (296.23)  Axis I: Major Depression, Recurrent severe and Substance Abuse Axis II: Deferred Axis III:  Past Medical History  Diagnosis Date  . 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 Followed by Dr. Louanne Skye  . Hypertension   . Cocaine abuse     ongoing with resultant vaculitis.  . Tobacco abuse   . Depression   . Normocytic anemia     BL Hgb 9.8-12. Last anemia panel 04/2010 - showing Fe 19, ferritin 101.  Pt on monthly B12 injections  . Rheumatoid arthritis(714.0)     patient reported  . Inflammatory arthritis   . Headache(784.0)    Axis IV: economic problems, housing problems, other psychosocial or environmental problems, problems related to social environment, problems with access to health care services and problems with primary support group Axis V: 41-50 serious symptoms  ADL's:  Intact  Sleep: Good  Appetite:  Good  Suicidal Ideation:  Vauge plan, no intent or means Homicidal Ideation:  Denies  AEB (as evidenced by):  Psychiatric Specialty Exam: Review of Systems  Constitutional: Negative.   HENT: Negative.   Eyes: Negative.   Respiratory: Negative.   Cardiovascular: Negative.   Gastrointestinal: Negative.   Genitourinary: Negative.   Musculoskeletal: Positive for myalgias (L leg pain due to cellulitits).  Skin: Negative.   Neurological: Negative.    Endo/Heme/Allergies: Negative.   Psychiatric/Behavioral: Positive for depression (8/10). Negative for suicidal ideas, hallucinations and substance abuse. The patient does not have insomnia. Nervous/anxious: 8/10.     Blood pressure 144/81, pulse 67, temperature 97.5 F (36.4 C), temperature source Oral, resp. rate 14, height 5' 1.5" (1.562 m), weight 45.36 kg (100 lb).Body mass index is 18.59 kg/(m^2).  General Appearance: Casual and Fairly Groomed  Engineer, water::  Minimal  Speech:  Clear and Coherent  Volume:  Normal  Mood:  Anxious and Depressed  Affect:  Depressed  Thought Process:  Intact  Orientation:  Full (Time, Place, and Person)  Thought Content:  WDL  Suicidal Thoughts:  Yes, no intent  Homicidal Thoughts:  No  Memory:  Immediate;   Good Recent;   Good Remote;   Good  Judgement:  Fair  Insight:  Fair  Psychomotor Activity:  Normal  Concentration:  Fair  Recall:  Good  Akathisia:  No  Handed:  Right  AIMS (if indicated):     Assets:  Desire for Improvement Resilience  Sleep:  Number of Hours: 6.25   Current Medications: Current Facility-Administered Medications  Medication Dose Route Frequency Provider Last Rate Last Dose  . alum & mag hydroxide-simeth (MAALOX/MYLANTA) 200-200-20 MG/5ML suspension 30 mL  30 mL Oral Q4H PRN Benjamine Mola, FNP      . amantadine (SYMMETREL) capsule 100 mg  100 mg Oral BID Benjamine Mola, FNP   100 mg at 10/20/13 1706  . buPROPion (WELLBUTRIN) tablet 100 mg  100 mg Oral BID Benjamine Mola, FNP   100  mg at 10/20/13 1706  . hydrOXYzine (ATARAX/VISTARIL) tablet 25 mg  25 mg Oral Q6H PRN Benjamine Mola, FNP   25 mg at 10/20/13 2053  . LORazepam (ATIVAN) tablet 0.5 mg  0.5 mg Oral BID PRN Benjamine Mola, FNP      . magnesium hydroxide (MILK OF MAGNESIA) suspension 30 mL  30 mL Oral [name] PRN Benjamine Mola, FNP      . mirtazapine (REMERON) tablet 7.5 mg  7.5 mg Oral QHS Benjamine Mola, FNP   7.5 mg at 10/20/13 2206  . nicotine (NICODERM CQ  - dosed in mg/24 hours) patch 14 mg  14 mg Transdermal Q0600 Benjamine Mola, FNP   14 mg at 10/20/13 1308  . oxyCODONE (Oxy IR/ROXICODONE) immediate release tablet 10 mg  10 mg Oral Q4H PRN Durward Parcel, MD   10 mg at 10/20/13 2206  . traZODone (DESYREL) tablet 50 mg  50 mg Oral QHS Benjamine Mola, FNP   50 mg at 10/20/13 2206    Lab Results: No results found for this or any previous visit (from the past 48 hour(s)).  Physical Findings: AIMS: Facial and Oral Movements Muscles of Facial Expression: None, normal Lips and Perioral Area: None, normal Jaw: None, normal Tongue: None, normal,Extremity Movements Upper (arms, wrists, hands, fingers): None, normal Lower (legs, knees, ankles, toes): None, normal, Trunk Movements Neck, shoulders, hips: None, normal, Overall Severity Severity of abnormal movements (highest score from questions above): None, normal Incapacitation due to abnormal movements: None, normal Patient's awareness of abnormal movements (rate only patient's report): No Awareness, Dental Status Current problems with teeth and/or dentures?: Yes Does patient usually wear dentures?: Yes  CIWA:    COWS:     Treatment Plan Summary: [name] contact with patient to assess and evaluate symptoms and progress in treatment Medication management  Plan: Review of chart, vital signs, medications, and notes.  1-Individual and group therapy  2-Medication management for depression and anxiety: Medications reviewed with the patient and she stated no untoward effects, no changes made  3-Coping skills for depression, anxiety, substance abuse  4-Continue crisis stabilization and management  5-Address health issues--monitoring vital signs, stable  6-Treatment plan in progress to prevent relapse of depression, substance abuse, and anxiety 7. Disposition plans are in progress patient may be discharged on Monday if she continued to contract for safety unable to improve  clinically  Medical Decision Making Problem Points:  Established problem, worsening (2) and Review of psycho-social stressors (1) Data Points:  Review and summation of old records (2) Review of medication regiment & side effects (2) Review of new medications or change in dosage (2)  I certify that inpatient services furnished can reasonably be expected to improve the patient's condition.   Waylan Boga, [place] 10/21/2013, 11:50 AM I agree with findings and treatment plan of this patient

## 2013-10-22 MED ORDER — RISPERIDONE 0.25 MG PO TABS
0.2500 mg | ORAL_TABLET | Freq: Two times a day (BID) | ORAL | Status: DC
  Administered 2013-10-22 – 2013-10-23 (×2): 0.25 mg via ORAL
  Filled 2013-10-22 (×2): qty 1
  Filled 2013-10-22: qty 6
  Filled 2013-10-22 (×2): qty 1
  Filled 2013-10-22: qty 6
  Filled 2013-10-22: qty 1

## 2013-10-22 NOTE — Progress Notes (Signed)
[name], female   DOB: August 19, 1961, 52 y.o.   MRN: [id number] [facility] MD Progress Note  10/22/2013 11:33 AM [name]  MRN:  [id number] Subjective:   Patient was in bed again this morning complaining of leg pain, pain medications in place and Keflex started for wound due to her compromised nutritional state and possible infection.  She complains of hearing voices, Risperdal started, and increased depression with suicidal ideations.  Her appetite is "good" and she forwarded more information today than yesterday.  Diagnosis:    DSM5: Substance/Addictive Disorders:  Opioid Disorder - Severe (304.00) Depressive Disorders:  Major Depressive Disorder - Severe (296.23)  Axis I: Major Depression, Recurrent severe and Substance Abuse Axis II: Deferred Axis III:  Past Medical History  Diagnosis Date  . 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 Followed by Dr. Louanne Skye  . Hypertension   . Cocaine abuse     ongoing with resultant vaculitis.  . Tobacco abuse   . Depression   . Normocytic anemia     BL Hgb 9.8-12. Last anemia panel 04/2010 - showing Fe 19, ferritin 101.  Pt on monthly B12 injections  . Rheumatoid arthritis(714.0)     patient reported  . Inflammatory arthritis   . Headache(784.0)    Axis IV: economic problems, housing problems, other psychosocial or environmental problems, problems related to social environment, problems with access to health care services and problems with primary support group Axis V: 41-50 serious symptoms  ADL's:  Intact  Sleep: Good  Appetite:  Good  Suicidal Ideation:  Vauge plan, no intent or means Homicidal Ideation:  Denies  AEB (as evidenced by):  Psychiatric Specialty Exam: Review of Systems  Constitutional: Negative.   HENT: Negative.   Eyes: Negative.   Respiratory: Negative.   Cardiovascular: Negative.   Gastrointestinal: Negative.   Genitourinary: Negative.   Musculoskeletal: Positive for myalgias (L leg pain  due to cellulitits).       Leg pain  Skin: Negative.   Neurological: Negative.   Endo/Heme/Allergies: Negative.   Psychiatric/Behavioral: Positive for depression (8/10), suicidal ideas, hallucinations and substance abuse. The patient does not have insomnia. Nervous/anxious: 8/10.     Blood pressure 106/72, pulse 58, temperature 98.2 F (36.8 C), temperature source Oral, resp. rate 18, height 5' 1.5" (1.562 m), weight 45.36 kg (100 lb).Body mass index is 18.59 kg/(m^2).  General Appearance: Dishelved  Eye Contact::  Minimal  Speech:  Clear and Coherent  Volume:  Normal  Mood:  Anxious and Depressed  Affect:  Depressed  Thought Process:  Auditory hallucinations  Orientation:  Full (Time, Place, and Person)  Thought Content:  WDL  Suicidal Thoughts:  Yes, no intent  Homicidal Thoughts:  No  Memory:  Immediate;   Good Recent;   Good Remote;   Good  Judgement:  Fair  Insight:  Fair  Psychomotor Activity:  Normal  Concentration:  Fair  Recall:  Good  Akathisia:  No  Handed:  Right  AIMS (if indicated):     Assets:  Desire for Improvement Resilience  Sleep:  Number of Hours: 5   Current Medications: Current Facility-Administered Medications  Medication Dose Route Frequency Provider Last Rate Last Dose  . alum & mag hydroxide-simeth (MAALOX/MYLANTA) 200-200-20 MG/5ML suspension 30 mL  30 mL Oral Q4H PRN Benjamine Mola, FNP      . amantadine (SYMMETREL) capsule 100 mg  100 mg Oral BID Benjamine Mola, FNP   100 mg at 10/21/13 1818  .  buPROPion [facility]) tablet 100 mg  100 mg Oral BID Benjamine Mola, FNP   100 mg at 10/21/13 1818  . cephALEXin (KEFLEX) capsule 500 mg  500 mg Oral Q8H Waylan Boga, NP   500 mg at 10/22/13 2595  . hydrOXYzine (ATARAX/VISTARIL) tablet 25 mg  25 mg Oral Q6H PRN Benjamine Mola, FNP   25 mg at 10/21/13 2037  . LORazepam (ATIVAN) tablet 0.5 mg  0.5 mg Oral BID PRN Benjamine Mola, FNP   0.5 mg at 10/21/13 2122  . magnesium hydroxide (MILK OF MAGNESIA)  suspension 30 mL  30 mL Oral Daily PRN Benjamine Mola, FNP      . mirtazapine (REMERON) tablet 7.5 mg  7.5 mg Oral QHS Benjamine Mola, FNP   7.5 mg at 10/21/13 2213  . nicotine (NICODERM CQ - dosed in mg/24 hours) patch 14 mg  14 mg Transdermal Q0600 Benjamine Mola, FNP   14 mg at 10/21/13 0800  . oxyCODONE (Oxy IR/ROXICODONE) immediate release tablet 10 mg  10 mg Oral Q4H PRN Durward Parcel, MD   10 mg at 10/21/13 2212  . traZODone (DESYREL) tablet 50 mg  50 mg Oral QHS Benjamine Mola, FNP   50 mg at 10/21/13 2213    Lab Results: No results found for this or any previous visit (from the past 48 hour(s)).  Physical Findings: AIMS: Facial and Oral Movements Muscles of Facial Expression: None, normal Lips and Perioral Area: None, normal Jaw: None, normal Tongue: None, normal,Extremity Movements Upper (arms, wrists, hands, fingers): None, normal Lower (legs, knees, ankles, toes): None, normal, Trunk Movements Neck, shoulders, hips: None, normal, Overall Severity Severity of abnormal movements (highest score from questions above): None, normal Incapacitation due to abnormal movements: None, normal Patient's awareness of abnormal movements (rate only patient's report): No Awareness, Dental Status Current problems with teeth and/or dentures?: Yes Does patient usually wear dentures?: Yes  CIWA:    COWS:     Treatment Plan Summary: Daily contact with patient to assess and evaluate symptoms and progress in treatment Medication management  Plan: Review of chart, vital signs, medications, and notes.  1-Individual and group therapy  2-Medication management for depression and anxiety: Medications reviewed with the patient and she stated no untoward effects, antibiotic ordered for her leg due to compromised nutritional state and small amount of discolored discharge, malodorous; Risperdal added for her auditory hallucinations  3-Coping skills for depression, anxiety, substance abuse   4-Continue crisis stabilization and management  5-Address health issues--monitoring vital signs, stable  6-Treatment plan in progress to prevent relapse of depression, substance abuse, and anxiety 7. Disposition plans are in progress patient may be discharged on Monday if she continued to contract for safety unable to improve clinically  Medical Decision Making Problem Points:  Established problem, worsening (2) and Review of psycho-social stressors (1) Data Points:  Review and summation of old records (2) Review of medication regiment & side effects (2) Review of new medications or change in dosage (2)  I certify that inpatient services furnished can reasonably be expected to improve the patient's condition.   Waylan Boga, [place] 10/22/2013, 11:33 AM I agree with findings and treatment plan of this patient.

## 2013-10-22 NOTE — Progress Notes (Signed)
Psychoeducational Group Note  Date:  10/22/2013 Time:  1015  Group Topic/Focus:  Making Healthy Choices:   The focus of this group is to help patients identify negative/unhealthy choices they were using prior to admission and identify positive/healthier coping strategies to replace them upon discharge.  Participation Level: Did not attend Paulino Rily 10/22/2013

## 2013-10-22 NOTE — Progress Notes (Signed)
Patient observed up in the dayroom laughing and tallking with peers watching the football game. She came to medication window requesting medication for anxiety and is aware that her pain medication is not due yet. She reports that she has been in the bed most of the day because she was really tired. She reports that she has been hearing voices and a new medication was started and asked if she would be getting it tonight. Writer informed of her of the schedule for this medication and she was fine with it. She reports passive si and verbally contracts for safety. She is compliant with her meds and is observed to be in good mood when in the dayroom with peers laughing and joking with staff also. Safety maintained on unit, denies hi/visual hallucinations.

## 2013-10-22 NOTE — Progress Notes (Signed)
[facility] Group Notes:  (Nursing/MHT/Case Management/Adjunct)  Date:  10/22/2013  Time:  9:40 PM  Type of Therapy:  Group Therapy  Participation Level:  Active  Participation Quality:  Appropriate  Affect:  Appropriate  Cognitive:  Appropriate  Insight:  Appropriate  Engagement in Group:  Engaged  Modes of Intervention:  Discussion  Summary of Progress/Problems:The patient expressed she learn in group today to get support from family. The patient said that she needs to get positive people in her life.  Nash Shearer 10/22/2013, 9:40 PM

## 2013-10-22 NOTE — Progress Notes (Signed)
[facility] Group Notes:  (Nursing/MHT/Case Management/Adjunct)  Date:  10/22/2013  Time:  5:01 PM  Type of Therapy:  Psychoeducational Skills  Participation Level:  None  Participation Quality:  None  Affect:  Flat  Cognitive:  None  Insight:  None  Engagement in Group:  Engaged  Modes of Intervention:  None  Summary of Progress/Problems: Pts did an activity where they wrote down 5 unique qualities about themselves and had to guess who those qualities belonged to. Pts were able to share things they wouldn't normally have shared and learn about their peers. Pt attended group but did not contribute, participate, or engage in group.  [name] 10/22/2013, 5:01 PM

## 2013-10-22 NOTE — Progress Notes (Signed)
[name] has  Been sleeping in her bed in her room all day today. She does not ttend her goups and is mostly interested in what time she receives her pain meds and her vistaril. A SHe writes on her self inventory  She cont to have " off and on " SI within the past 24 hrs, she rates her depression and hopelesness "9/6" and does not know how to work on her DC plan.  R Safety is in place and poc maintained and moved forward.

## 2013-10-22 NOTE — [facility] Group Notes (Signed)
[facility] Group Notes:  (Clinical Social Work)  10/22/2013   1:15-2:15PM  Summary of Progress/Problems:  The main focus of today's process group was to   identify the patient's current support system and decide on other supports that can be put in place.  The picture on workbook was used to discuss why additional supports are needed.  An emphasis was placed on using counselor, doctor, therapy groups, 12-step groups, and problem-specific support groups to expand supports.   The patient expressed full comprehension of the concepts presented, and agreed that there is a need to add more supports.  The patient reported a willingness to think about adding supports, but stated "I don't know" to this question at this time.  She did say that her faith is very strong, and that God has kept her alive.  She also was commended for being a self-supporting person.  She was very active in supporting others, particularly her roommate.  Toward the end of group she seemed to be willing to add counseling.  Type of Therapy:  Process Group  Participation Level:  Active  Participation Quality:  Attentive and Sharing  Affect:  Blunted  Cognitive:  Appropriate and Oriented  Insight:  Engaged  Engagement in Therapy:  Engaged  Modes of Intervention:  Education,  Support and [employer], LCSW 10/22/2013, 4:00pm

## 2013-10-22 NOTE — Progress Notes (Signed)
Patient has been up and active on the unit, attended group this evening and has voiced complaints of feeling anxious d/t her leg pain and is medicated accordingly . She is aware of the time for her next pain medication. She currently denies si/hi/a/v [place]. Support and encouragement offered, safety maintained on unit, will continue to monitor.

## 2013-10-22 NOTE — Progress Notes (Signed)
Psychoeducational Group Note  Date: 10/22/2013 Time:  0930  Group Topic/Focus:  Gratefulness:  The focus of this group is to help patients identify what two things they are most grateful for in their lives. What helps ground them and to center them on their work to their recovery.  Participation Level:  Did not attend Paulino Rily

## 2013-10-23 MED ORDER — HYDROXYZINE HCL 25 MG PO TABS: 25.0000 mg | ORAL_TABLET | Freq: Four times a day (QID) | ORAL | Status: DC | PRN

## 2013-10-23 MED ORDER — OXYCODONE HCL 10 MG PO TABS: 10.0000 mg | ORAL_TABLET | ORAL | Status: DC | PRN

## 2013-10-23 MED ORDER — BUPROPION HCL 100 MG PO TABS: 100.0000 mg | ORAL_TABLET | Freq: Two times a day (BID) | ORAL | Status: DC

## 2013-10-23 MED ORDER — TRAZODONE HCL 50 MG PO TABS: 50.0000 mg | ORAL_TABLET | Freq: Every day | ORAL | Status: DC

## 2013-10-23 MED ORDER — CEPHALEXIN 500 MG PO CAPS: 500.0000 mg | ORAL_CAPSULE | Freq: Three times a day (TID) | ORAL | Status: DC

## 2013-10-23 MED ORDER — RISPERIDONE 0.25 MG PO TABS: 0.2500 mg | ORAL_TABLET | Freq: Two times a day (BID) | ORAL | Status: DC

## 2013-10-23 MED ORDER — MIRTAZAPINE 7.5 MG PO TABS: 7.5000 mg | ORAL_TABLET | Freq: Every day | ORAL | Status: DC

## 2013-10-23 MED ORDER — AMANTADINE HCL 100 MG PO CAPS: 100.0000 mg | ORAL_CAPSULE | Freq: Two times a day (BID) | ORAL | Status: DC

## 2013-10-23 MED ORDER — LORAZEPAM 0.5 MG PO TABS: 0.5000 mg | ORAL_TABLET | Freq: Two times a day (BID) | ORAL | Status: DC | PRN

## 2013-10-23 NOTE — [facility] Group Notes (Signed)
[facility] LCSW Aftercare Discharge Planning Group Note   10/23/2013 8:45 AM  Participation Quality:  Alert, Appropriate and Oriented  Mood/Affect:  Flat and Depressed  Depression Rating:  8  Anxiety Rating:  6  Thoughts of Suicide:  Pt denies SI/HI  Will you contract for safety?   Yes  Current AVH:  Pt denies  Plan for Discharge/Comments:  Pt attended discharge planning group and actively participated in group.  CSW provided pt with today's workbook.  Pt reports not knowing how she is doing yet today.  Pt will return to friend's house in [place].  Pt will follow up at [facility] for medication management and therapy.  No further needs voiced by pt at this time.    Transportation Means: Pt reports access to transportation - provided pt with a bus pass  Supports: No supports mentioned at this time  [name], [place] 10/23/2013 9:40 AM

## 2013-10-23 NOTE — Progress Notes (Signed)
[facility] Adult Case Management Discharge Plan :  Will you be returning to the same living situation after discharge: Yes,  returning to friend's house in [place] At discharge, do you have transportation home?:Yes,  provided bus pass Do you have the ability to pay for your medications:Yes,  access to meds  Release of information consent forms completed and in the chart;  Patient's signature needed at discharge.  Patient to Follow up at: Follow-up Information   Follow up with [facility] On 10/23/2013. (Walk in for hospital discharge appointment, for outpatient medication management and therapy. Walk in clinic is Monday - Friday 8 am - 3 pm)    Contact information:   [address], [place] [address] Phone: [phone] Fax: [phone]      Patient denies SI/HI:   Yes,  denies SI/HI    Safety Planning and Suicide Prevention discussed:  Yes,  discussed with pt. Pt refused consent to contact family/friend.  See suicide prevention education note.   Ane Payment 10/23/2013, 10:49 AM

## 2013-10-23 NOTE — BHH Group Notes (Signed)
[facility] LCSW Group Therapy  10/23/2013  1:15 PM   Type of Therapy:  Group Therapy  Participation Level:  Active  Participation Quality:  Attentive, Sharing and Supportive  Affect:  Depressed, Drowsy and Agitated  Cognitive:  Alert and Oriented  Insight:  Developing/Improving and Engaged  Engagement in Therapy:  Developing/Improving and Engaged  Modes of Intervention:  Clarification, Confrontation, Discussion, Education, Exploration, Limit-setting, Orientation, Problem-solving, Rapport Building, Art therapist, Socialization and Support  Summary of Progress/Problems: Pt identified obstacles faced currently and processed barriers involved in overcoming these obstacles. Pt identified steps necessary for overcoming these obstacles and explored motivation (internal and external) for facing these difficulties head on. Pt further identified one area of concern in their lives and chose a goal to focus on for today.  Pt chose to not participate and verbalized dissatisfaction with not being discharged yet.    [name], LCSW 10/23/2013 2:56 PM

## 2013-10-23 NOTE — Tx Team (Signed)
Interdisciplinary Treatment Plan Update (Adult)  Date: 10/23/2013  Time Reviewed:  9:45 AM  Progress in Treatment: Attending groups: Yes Participating in groups:  Yes Taking medication as prescribed:  Yes Tolerating medication:  Yes Family/Significant othe contact made: Yes Patient understands diagnosis:  Yes Discussing patient identified problems/goals with staff:  Yes Medical problems stabilized or resolved:  Yes Denies suicidal/homicidal ideation: Yes Issues/concerns per patient self-inventory:  Yes Other:  New problem(s) identified: N/A  Discharge Plan or Barriers: Pt will follow up at [facility] for medication management and therapy.    Reason for Continuation of Hospitalization: Stable to d/c today  Comments: N/A  Estimated length of stay: D/C today  For review of initial/current patient goals, please see plan of care.  Attendees: Patient:  [name] 10/23/2013 10:24 AM   Family:     Physician:  Dr. Zorita Pang 10/23/2013 10:24 AM   Nursing:   Gaylan Gerold, RN 10/23/2013 10:24 AM   Clinical Social Worker:  [name], LCSW 10/23/2013 10:24 AM   Other: Catalina Pizza, PA 10/23/2013 10:24 AM   Other:  Gala Romney, care coordination 10/23/2013 10:24 AM   Other:  Thurnell Garbe, RN 10/23/2013 10:24 AM   Other:  Grayland Ormond, RN 10/23/2013 10:24 AM   Other:    Other:    Other:    Other:    Other:      Scribe for Treatment Team:   Ane Payment, 10/23/2013 , 10:24 AM

## 2013-10-23 NOTE — [facility] Suicide Risk Assessment (Signed)
Suicide Risk Assessment  Discharge Assessment     Demographic Factors:  Adolescent or young adult, Low socioeconomic status and Unemployed  Mental Status Per Nursing Assessment::   On Admission:     Current Mental Status by Physician: Mental Status Examination: Patient appeared as per his stated age, casually dressed, and fairly groomed, and has poor eye contact. 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 He has normal rate, rhythm, and volume of speech. Her thought process is linear and goal directed. Patient has denied suicidal, homicidal ideations, intentions or plans. Patient has no evidence of auditory or visual hallucinations, delusions, and paranoia. Patient has fair insight judgment and impulse control.   Loss Factors: Financial problems/change in socioeconomic status  Historical Factors: Impulsivity  Risk Reduction Factors:   Sense of responsibility to family, Religious beliefs about death, Living with another person, especially a relative, Positive social support, Positive therapeutic relationship and Positive coping skills or problem solving skills  Continued Clinical Symptoms:  Depression:   Recent sense of peace/wellbeing Alcohol/Substance Abuse/Dependencies Chronic Pain Previous Psychiatric Diagnoses and Treatments Medical Diagnoses and Treatments/Surgeries  Cognitive Features That Contribute To Risk:  Polarized thinking    Suicide Risk:  Minimal: No identifiable suicidal ideation.  Patients presenting with no risk factors but with morbid ruminations; may be classified as minimal risk based on the severity of the depressive symptoms  Discharge Diagnoses:   AXIS I:  Generalized Anxiety Disorder, Major Depression, Recurrent severe, Substance Induced Mood Disorder and Cocaine abuse versus dependence AXIS II:  Deferred AXIS III:   Past Medical  History  Diagnosis Date  . 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 Followed by Dr. Louanne Skye  . Hypertension   . Cocaine abuse     ongoing with resultant vaculitis.  . Tobacco abuse   . Depression   . Normocytic anemia     BL Hgb 9.8-12. Last anemia panel 04/2010 - showing Fe 19, ferritin 101.  Pt on monthly B12 injections  . Rheumatoid arthritis(714.0)     patient reported  . Inflammatory arthritis   . Headache(784.0)    AXIS IV:  other psychosocial or environmental problems, problems related to legal system/crime and problems with primary support group AXIS V:  51-60 moderate symptoms  Plan Of Care/Follow-up recommendations:  Activity: As tolerated, diet: Regular  Is patient on multiple antipsychotic therapies at discharge:  No   Has Patient had three or more failed trials of antipsychotic monotherapy by history:  No  Recommended Plan for Multiple Antipsychotic Therapies: NA  Koi Zangara,JANARDHAHA R. 10/23/2013, 1:07 PM

## 2013-10-23 NOTE — Progress Notes (Signed)
[name], female   DOB: 04-14-1961, 53 y.o.   MRN: [id number]  Pt. Denies HI and A/V hallucinations to Probation officer. Patient does state she has passive SI but verbally contracts for safety. Patient rates her depression at 10/10 and her hopelessness at 7/10 today.  Belongings returned to patient at time of discharge. Patient rated her pain at 10/10 but stated it went down to a 7/10 after PRN Oxy-IR. Discharge instructions and medications were reviewed with patient. Patient verbalized understanding of both medications and discharge instructions.

## 2013-10-23 NOTE — Discharge Summary (Signed)
Physician Discharge Summary Note  Patient:  [name] is an 53 y.o., female MRN:  [id number] DOB:  1961-06-05 Patient phone:  [phone] (home)  Patient address:   [address] [place] [address],   Date of Admission:  10/17/2013 Date of Discharge: 10/23/2013  Reason for Admission:  Major depression, recurrent, severe; suicidal ideation  Discharge Diagnoses: Active Problems:   DEPRESSION, SEVERE   Suicidal ideation   SUBSTANCE ABUSE, MULTIPLE   Cocaine dependence   Psychosis  Review of Systems  Constitutional: Negative.   HENT: Positive for ear pain (pt has bilateral vasculitis).   Eyes: Negative.   Respiratory: Negative.   Cardiovascular: Negative.   Gastrointestinal: Negative.   Genitourinary: Negative.   Musculoskeletal: Negative.   Skin: Negative.   Neurological: Negative.   Endo/Heme/Allergies: Negative.   Psychiatric/Behavioral: Positive for depression and substance abuse (cocaine). Negative for suicidal ideas and hallucinations. The patient is nervous/anxious and has insomnia.     DSM5:  Substance/Addictive Disorders:  Cocaine dependence Depressive Disorders:  Major Depressive Disorder - Severe (296.23)  Axis Diagnosis:   AXIS I:  Major Depression, Recurrent severe, Substance Abuse and Substance Induced Mood Disorder AXIS II:  Deferred AXIS III:   Past Medical History  Diagnosis Date  . 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 Followed by Dr. Louanne Skye  . Hypertension   . Cocaine abuse     ongoing with resultant vaculitis.  . Tobacco abuse   . Depression   . Normocytic anemia     BL Hgb 9.8-12. Last anemia panel 04/2010 - showing Fe 19, ferritin 101.  Pt on monthly B12 injections  . Rheumatoid arthritis(714.0)     patient reported  . Inflammatory arthritis   . Headache(784.0)    AXIS IV:  economic problems, other psychosocial or environmental problems and problems related to social environment AXIS V:  61-70 mild  symptoms  Level of Care:  OP  Hospital Course:   [name] is an 53 y.o. Female known to Dr. Louretta Shorten from her previous 2 acute psychiatric hospitalization within the last 6 months for 4 cocaine abuse versus dependence and also substance induced mood disorder. Patient was admitted to the [facility] levamisole-induced skin necrosis, depression, normocytic anemia, and RA. Patient has been staying with their roommate/friend otherwise she was to homeless. She smokes 2-3 cigs/day for 42 years. She is "trying to" stay away from cocaine.   Pt affirms SI with a plan to "cust wrists" and states that she is "tired of life". Reports poor sleep, but good appetite (addressed). Pt was crying throughout the assessment. Rates continues to minimize anxiety symptoms, but does appear visibly anxious and rates depression 5/10. Denies AVH; denies physical complaints. Pt also mentions that she has only used "cocaine twice since Dec 2014". There are vasculitis sores to bilateral ears approximately 1cm in length and large weeping vasculitis wounds (with hx of debridement with [place] in December), currently covered with Aquacell. Will change dsg as soon as supplies arrive and will consider consultation with wound care if they do not appear to be healing well. Pt is in agreement with current medication regimen and treatment plan; Keflex antibiotic was started for wound treatment.   During Hospitalization: Medications managed, psychoeducation, group and individual therapy. Pt currently denies SI, HI, and Psychosis. At discharge, pt minimizes depression/anxiety. Pt states that she "doesn't feel good". Affirms agreement with medication regimen and discharge plan; going home with roommate. Wounds are healing well with  aquacell dressing and Keflex 3x daily. Denies other physical and psychological concerns at time of discharge.   Consults:  None  Significant Diagnostic Studies:  None  Discharge  Vitals:   Blood pressure 103/68, pulse 68, temperature 97.6 F (36.4 C), temperature source Oral, resp. rate 20, height 5' 1.5" (1.562 m), weight 45.36 kg (100 lb). Body mass index is 18.59 kg/(m^2). Lab Results:   No results found for this or any previous visit (from the past 72 hour(s)).  Physical Findings: AIMS: Facial and Oral Movements Muscles of Facial Expression: None, normal Lips and Perioral Area: None, normal Jaw: None, normal Tongue: None, normal,Extremity Movements Upper (arms, wrists, hands, fingers): None, normal Lower (legs, knees, ankles, toes): None, normal, Trunk Movements Neck, shoulders, hips: None, normal, Overall Severity Severity of abnormal movements (highest score from questions above): None, normal Incapacitation due to abnormal movements: None, normal Patient's awareness of abnormal movements (rate only patient's report): No Awareness, Dental Status Current problems with teeth and/or dentures?: Yes Does patient usually wear dentures?: Yes  CIWA:    COWS:     Psychiatric Specialty Exam: See Psychiatric Specialty Exam and Suicide Risk Assessment completed by Attending Physician prior to discharge.  Discharge destination:  Home  Is patient on multiple antipsychotic therapies at discharge:  No   Has Patient had three or more failed trials of antipsychotic monotherapy by history:  No  Recommended Plan for Multiple Antipsychotic Therapies: NA   Future Appointments Provider Department Dept Phone   11/24/2013 10:15 AM Rebecca Eaton, MD [place] [phone]       Medication List    ASK your doctor about these medications     Indication   amantadine 100 MG capsule  Commonly known as:  SYMMETREL  Take 1 capsule (100 mg total) by mouth 2 (two) times daily. For cravings of cocaine.   Indication:  cocaine cravings     buPROPion 100 MG tablet  Commonly known as:  WELLBUTRIN  Take 1 tablet (100 mg total) by mouth 2 (two)  times daily. For depression related to cocaine use.   Indication:  Major Depressive Disorder     mirtazapine 7.5 MG tablet  Commonly known as:  REMERON  Take 1 tablet (7.5 mg total) by mouth at bedtime.      oxyCODONE 5 MG immediate release tablet  Commonly known as:  Oxy IR/ROXICODONE  Take 1 tablet (5 mg total) by mouth every 4 (four) hours as needed for moderate pain.      traZODone 50 MG tablet  Commonly known as:  DESYREL  Take one tablet at bedtime for insomnia.   Indication:  Trouble Sleeping           Follow-up Information   Follow up with [facility] On 10/23/2013. (Walk in for hospital discharge appointment, for outpatient medication management and therapy. Walk in clinic is Monday - Friday 8 am - 3 pm)    Contact information:   [address], [place] [address] Phone: [phone] Fax: [phone]      Follow-up recommendations:  Activity:  As tolerated Diet:  Heart healthy with low sodium.  Comments:   Take all medications as prescribed. Keep all follow-up appointments as scheduled.  Do not consume alcohol or use illegal drugs while on prescription medications. Report any adverse effects from your medications to your primary care provider promptly.  In the event of recurrent symptoms or worsening symptoms, call 911, a crisis hotline, or go to the nearest emergency department  for evaluation.   Total Discharge Time:  Greater than 30 minutes.  Signed: Benjamine Mola, FNP-BC 10/23/2013, 10:35 AM  Patient was seen face-to-face for this psychiatric evaluation, admission and discharge suicide risk assessment and case discussed with a physician extender, formulated treatment plan and discharge plan. Reviewed the information documented by physician extender and agree with the treatment plan.  Phat Dalton,JANARDHAHA R. 10/24/2013 7:43 PM

## 2013-10-27 NOTE — Progress Notes (Signed)
Patient Discharge Instructions:  After Visit Summary (AVS):   Faxed to:  10/27/13 Discharge Summary Note:   Faxed to:  10/27/13 Psychiatric Admission Assessment Note:   Faxed to:  10/27/13 Suicide Risk Assessment - Discharge Assessment:   Faxed to:  10/27/13 Faxed/Sent to the Next Level Care provider:  10/27/13 Faxed to [facility] [place], 10/27/2013, 1:41 PM

## 2013-11-16 ENCOUNTER — Ambulatory Visit: Payer: Self-pay | Admitting: Internal Medicine

## 2013-11-16 IMAGING — CR DG CHEST 2V
2 series · 2 of 2 positions shown · non-contrast
Comparison: 10/12/2011

CLINICAL DATA: Chest pain.

CHEST - 2 VIEW

[w chest pa]
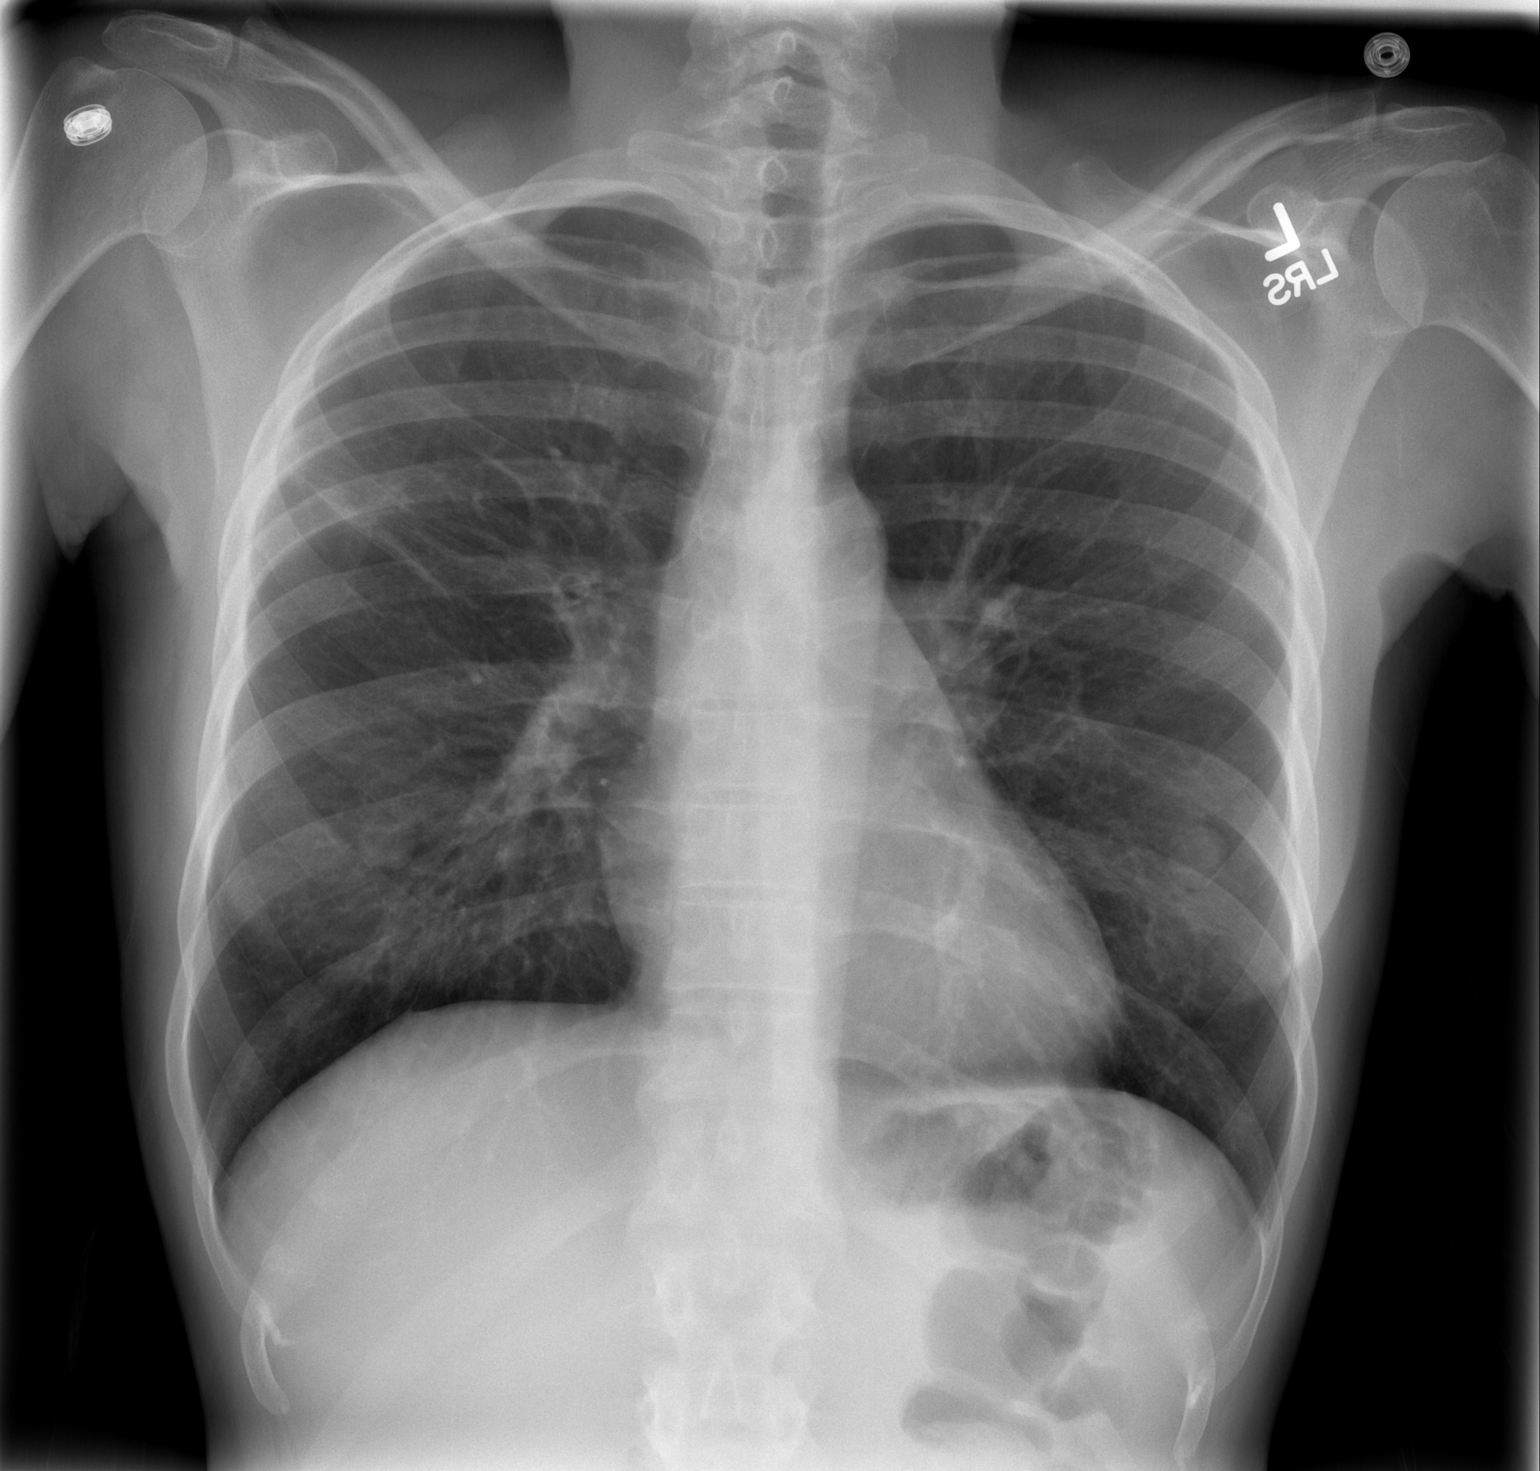

[w chest lat]
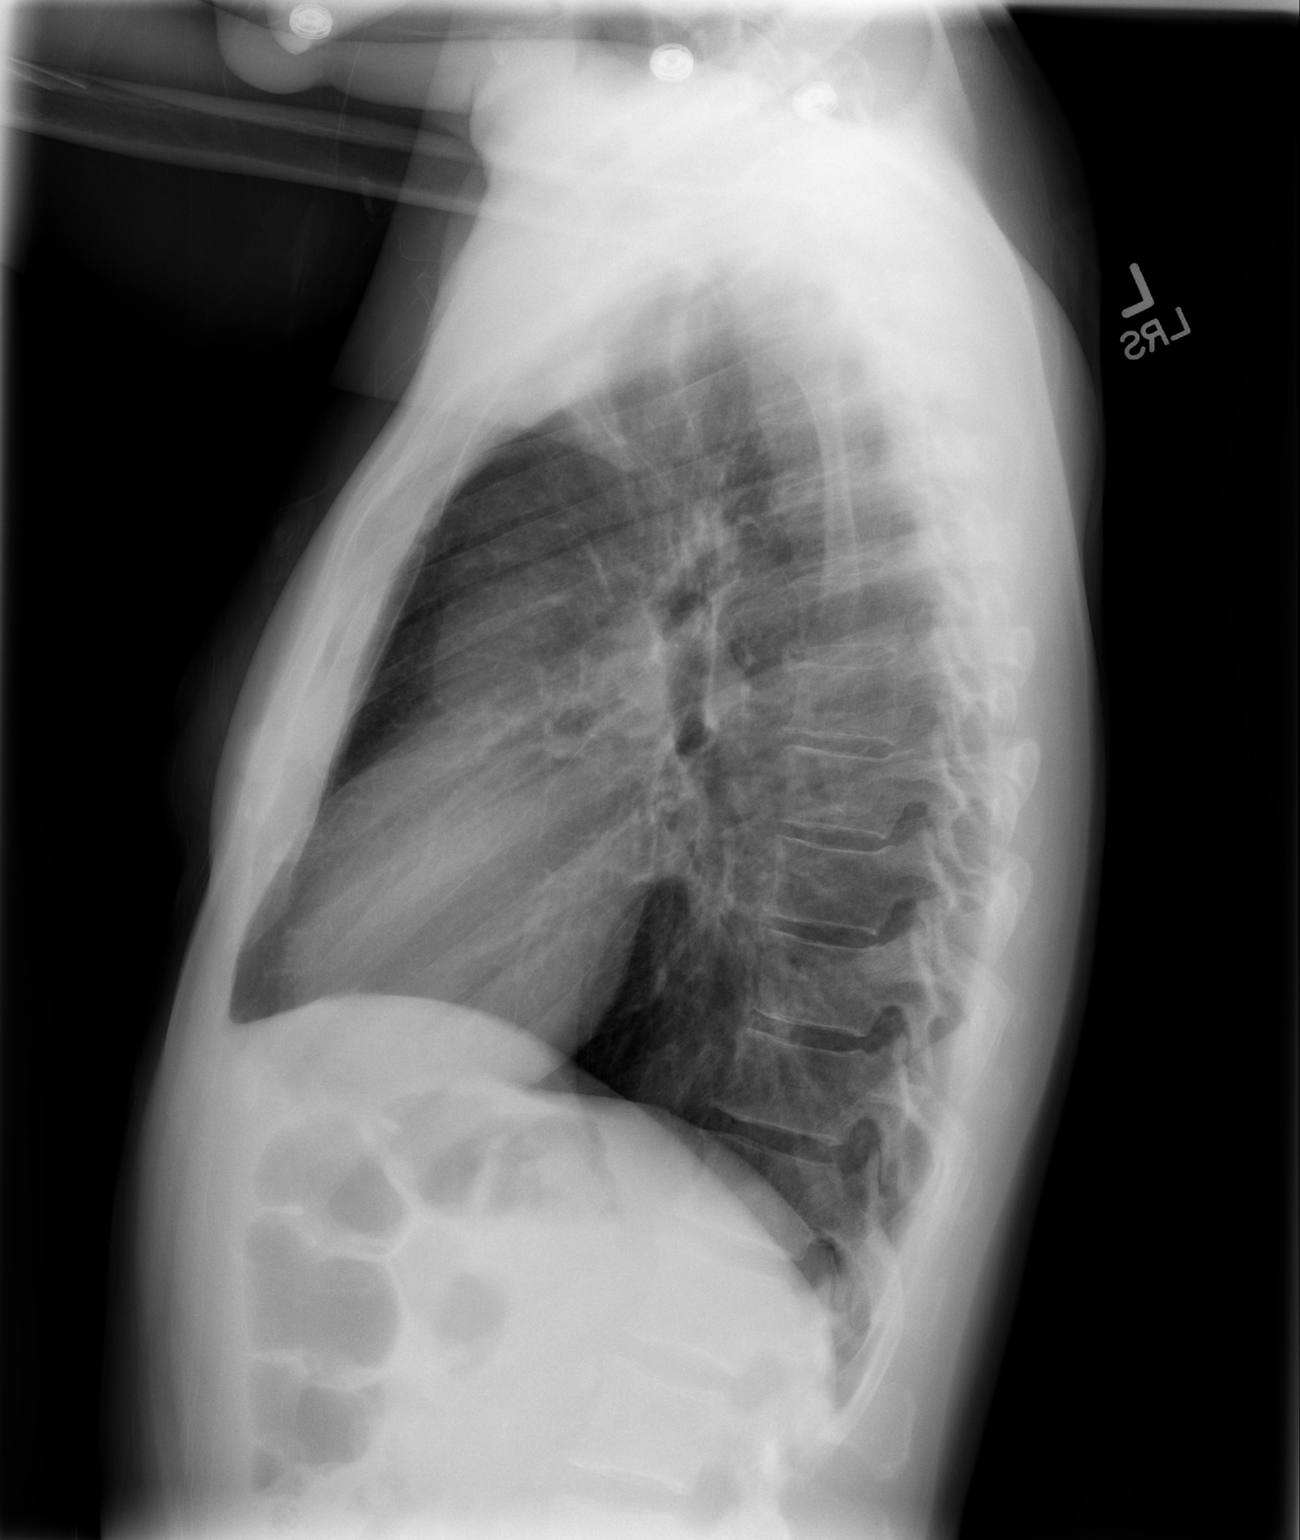

[2 of 2 positions shown; findings below may reference images not displayed]

FINDINGS: Two views of the chest were obtained.  There are stable
streaky densities in the right upper lung.  Previously, there had
been airspace disease in this area and this probably represents
postinflammatory changes and scarring. There are prominent nipple
shadows bilaterally.  Heart size is within normal limits and
stable.  Bony structures are intact.
IMPRESSION: No acute chest findings.

Scarring or postinflammatory changes in the right upper lung.

## 2013-11-24 ENCOUNTER — Ambulatory Visit: Payer: Self-pay | Admitting: Internal Medicine

## 2013-12-14 ENCOUNTER — Encounter (HOSPITAL_COMMUNITY): Payer: Self-pay | Admitting: Emergency Medicine

## 2013-12-14 ENCOUNTER — Emergency Department (HOSPITAL_COMMUNITY)
Admission: EM | Admit: 2013-12-14 | Discharge: 2013-12-14 | Disposition: A | Discharge status: Home / Self Care | Payer: Medicaid Other | Attending: Emergency Medicine | Admitting: Emergency Medicine

## 2013-12-14 DIAGNOSIS — Z862 Personal history of diseases of the blood and blood-forming organs and certain disorders involving the immune mechanism: Secondary | ICD-10-CM | POA: Insufficient documentation

## 2013-12-14 DIAGNOSIS — L98499 Non-pressure chronic ulcer of skin of other sites with unspecified severity: Secondary | ICD-10-CM | POA: Insufficient documentation

## 2013-12-14 DIAGNOSIS — Z79899 Other long term (current) drug therapy: Secondary | ICD-10-CM | POA: Insufficient documentation

## 2013-12-14 DIAGNOSIS — I776 Arteritis, unspecified: Secondary | ICD-10-CM | POA: Insufficient documentation

## 2013-12-14 DIAGNOSIS — I10 Essential (primary) hypertension: Secondary | ICD-10-CM | POA: Insufficient documentation

## 2013-12-14 DIAGNOSIS — F3289 Other specified depressive episodes: Secondary | ICD-10-CM | POA: Insufficient documentation

## 2013-12-14 DIAGNOSIS — F329 Major depressive disorder, single episode, unspecified: Secondary | ICD-10-CM | POA: Insufficient documentation

## 2013-12-14 DIAGNOSIS — M069 Rheumatoid arthritis, unspecified: Secondary | ICD-10-CM | POA: Insufficient documentation

## 2013-12-14 DIAGNOSIS — F1410 Cocaine abuse, uncomplicated: Secondary | ICD-10-CM | POA: Insufficient documentation

## 2013-12-14 DIAGNOSIS — F17200 Nicotine dependence, unspecified, uncomplicated: Secondary | ICD-10-CM | POA: Insufficient documentation

## 2013-12-14 DIAGNOSIS — IMO0002 Reserved for concepts with insufficient information to code with codable children: Secondary | ICD-10-CM | POA: Insufficient documentation

## 2013-12-14 LAB — CBC WITH DIFFERENTIAL/PLATELET
BASOS ABS: 0 10*3/uL (ref 0.0–0.1)
BASOS PCT: 0 % (ref 0–1)
EOS ABS: 0.1 10*3/uL (ref 0.0–0.7)
Eosinophils Relative: 1 % (ref 0–5)
HEMATOCRIT: 30.3 % — AB (ref 36.0–46.0)
Hemoglobin: 10.3 g/dL — ABNORMAL LOW (ref 12.0–15.0)
Lymphocytes Relative: 35 % (ref 12–46)
Lymphs Abs: 1.4 10*3/uL (ref 0.7–4.0)
MCH: 28.7 pg (ref 26.0–34.0)
MCHC: 34 g/dL (ref 30.0–36.0)
MCV: 84.4 fL (ref 78.0–100.0)
MONO ABS: 0.2 10*3/uL (ref 0.1–1.0)
Monocytes Relative: 5 % (ref 3–12)
NEUTROS ABS: 2.4 10*3/uL (ref 1.7–7.7)
Neutrophils Relative %: 59 % (ref 43–77)
Platelets: 251 10*3/uL (ref 150–400)
RBC: 3.59 MIL/uL — ABNORMAL LOW (ref 3.87–5.11)
RDW: 13.9 % (ref 11.5–15.5)
WBC: 4 10*3/uL (ref 4.0–10.5)

## 2013-12-14 LAB — BASIC METABOLIC PANEL
BUN: 11 mg/dL (ref 6–23)
CHLORIDE: 102 meq/L (ref 96–112)
CO2: 23 mEq/L (ref 19–32)
Calcium: 9 mg/dL (ref 8.4–10.5)
Creatinine, Ser: 0.63 mg/dL (ref 0.50–1.10)
GFR calc Af Amer: >90 mL/min (ref >90)
GFR calc non Af Amer: >90 mL/min (ref >90)
Glucose, Bld: 101 mg/dL — ABNORMAL HIGH (ref 70–99)
Potassium: 3.6 mEq/L — ABNORMAL LOW (ref 3.7–5.3)
Sodium: 138 mEq/L (ref 137–147)

## 2013-12-14 MED ORDER — DEXAMETHASONE SODIUM PHOSPHATE 10 MG/ML IJ SOLN
10.0000 mg | Freq: Once | INTRAMUSCULAR | Status: AC
  Administered 2013-12-14: 10 mg via INTRAVENOUS
  Filled 2013-12-14: qty 1

## 2013-12-14 MED ORDER — HYDROMORPHONE HCL PF 1 MG/ML IJ SOLN
0.5000 mg | Freq: Once | INTRAMUSCULAR | Status: AC
  Administered 2013-12-14: 0.5 mg via INTRAVENOUS
  Filled 2013-12-14: qty 1

## 2013-12-14 MED ORDER — LIDOCAINE 5 % EX OINT
TOPICAL_OINTMENT | Freq: Once | CUTANEOUS | Status: AC
  Administered 2013-12-14: 06:00:00 via TOPICAL
  Filled 2013-12-14: qty 35.44

## 2013-12-14 MED ORDER — IBUPROFEN 400 MG PO TABS: 400.0000 mg | ORAL_TABLET | Freq: Four times a day (QID) | ORAL | Status: DC | PRN

## 2013-12-14 MED ORDER — TRAMADOL HCL 50 MG PO TABS: 50.0000 mg | ORAL_TABLET | Freq: Four times a day (QID) | ORAL | Status: DC | PRN

## 2013-12-14 MED ORDER — PREDNISONE 50 MG PO TABS: 50.0000 mg | ORAL_TABLET | Freq: Every day | ORAL | Status: DC

## 2013-12-14 NOTE — Discharge Instructions (Signed)
Vasculitis Vasculitis is when your blood vessels are inflamed. There are many different blood vessels in the body, and vasculitis can affect any of them. This includes large (veins and arteries) and small (capillaries) vessels. With vasculitis,   Blood vessel walls can become thick.  Blood vessels can become narrow.  Blood vessels can become weak. Sometimes, it becomes so weak that the blood vessel bulges out like a balloon. This is called an aneurysm. Aneurysms are rare but can be life-threatening.  Scarring can occur.  Not enough blood can flow through the blood vessels. All of these things can damage many parts of the body, including the muscles, kidneys, lungs and brain. There are many types of vasculitis. Some types are short-term (acute), while others are long-term (chronic). Some types may go away without treatment, and others may need to be treated for a long time.  CAUSES  Vasculitis occurs when the body's immune system (which fights germs and disease) makes a mistake. It attacks its own blood vessels. This causes inflammation (the body's way of reacting to injury or infection).   Why this happens is usually not known. The condition is then called primary vasculitis.  Sometimes, something triggers the inflammation. This is called secondary vasculitis. Possible causes include:  Infections.  An immune system disease. Examples include lupus, rheumatoid arthritis and scleroderma.  An allergic reaction to a medicine.  Cancer that affects blood cells. This includes leukemia and lymphoma.  Males and females of all ages and races can develop vasculitis. Some risk factors make vasculitis more likely. These include:  Smoking.  Stress.  Physical injury. SYMPTOMS  There are more than 20 types of vasculitis. Symptoms of each type vary, but some symptoms are common.  Many people with vasculitis:  Have a fever.  Do not feel like eating.  Lose weight.  Feel very tired.  Have  aches and pains.  Feel weak.  Start to not have feeling (numbness) in an area.  Symptoms for some types of vasculitis also could be:  Sores in the mouth or eyes.  Skin problems. This could be sores, spots or rashes.  Trouble seeing.  Trouble breathing.  Blood in the urine.  Headaches.  Pain in the abdomen.  Stuffy or bloody nose. DIAGNOSIS  Vasculitis symptoms are similar to symptoms of many other conditions. That can make it hard to tell if you have vasculitis. To be sure, your caregiver will ask about your symptoms and do a physical exam. Certain tests may be necessary, such as:   A complete blood count (CBC). This test shows how many red blood cells are in your blood. Not having enough red blood cells (anemic) can result from vasculitis.  Erythrocyte sedimentation (also called sed rate test). It measures inflammation in the body.  C reactive protein (CRP). This also shows if there is inflammation.  Anti-neutrophil cytoplasmic antibodies (ANCA). This can tell if the immune system is reacting to certain cells in the blood.  A urine test. This checks for blood or protein in the urine. That could be a sign of kidney damage from vasculitis.  Imaging tests. These tests create pictures from inside the body. Options include:  X-rays.  Computed tomography (CT) scan. This uses X-rays guided by a computer.  Ultrasound. It creates an image using sound waves.  Magnetic resonance imaging (MRI). It uses radio waves, magnets and a computer.  Angiography. A dye is put into your blood vessels. Then, an X-ray is taken of them.  A biopsy of  a blood vessel. This means your caregiver will take out a small piece of a blood vessel. Then, it is checked under a microscope. This is an important test. It often is the best way to know for sure if you have vasculitis. TREATMENT   Treatment will depend on the type of vasculitis and how severe it is. Often, you will need to see a specialist in  immunologic diseases (rheumatologist).  Some types of vasculitis may go away without treatment.  Some types need only over-the-counter drugs.  Prescription medicines are used to treat many types of vasculitis. For example:  Corticosteroids. These are the drugs used most often. They are very powerful. Usually, a high dose is taken until symptoms improve. Then, the dose is gradually decreased. Using corticosteroids for a long time can cause problems. They can make muscles and bones weak. They can cause blood pressure to go up, and cause diabetes. Also, people often gain weight when they take corticosteroids.  Cytotoxic drugs. These kill cells that cause inflammation. Sometimes, they are used if corticosteroids do not help. Other times, both medications are taken.  Surgery. This may be needed to repair a blood vessel that has bulged out (aneurysm).  Treatment can sometimes cure your disease. Other times, it can put the disease in remission (no symptoms). Increased treatment and reevaluation might be necessary if your disease comes back or flares. HOME CARE INSTRUCTIONS   Take any medications that your caregiver prescribes. Follow the directions carefully.  Watch for any problems that can be caused by a drug (side effects). Tell your caregiver right away if you notice any changes or problems.  Keep all appointments for checkups. This is important to help your caregiver watch for side effects. Checkups may include:  Periodic blood tests.  Bone density testing. This checks how strong or weak your bones are.  Blood pressure checks. If your blood pressure rises, you may need to take a drug to control it while you are taking corticosteroids.  Blood sugar checks. This is to be sure you are not developing diabetes. If you have diabetes, corticosteroid medications may make it worse and require increased treatment.  Exercise. First, talk with your caregiver about what would be OK for you to do.  Aerobic exercise (which increases your heart rate) is often suggested. It includes walking. This type of exercise is good because it helps prevent bone loss. It also helps control your blood pressure.  Follow a healthy diet. Include good sources of protein in your diet. Also include fruits, vegetables and whole grains. Your caregiver can refer you to an expert on healthy eating (dietitian) for more detailed advice.  Learn as much as you can about vasculitis. Understanding your condition can help you cope with it. Coping can be hard because this may be something you will have to live with for years.  Consider joining a support group. It often helps to talk about your worries with others who have the same problems.  Tell your caregiver if you feel stressed, anxious or depressed. Your caregiver may refer you to a specialist, or recommend medication to relieve your symptoms. SEEK MEDICAL CARE IF:   The symptoms that led to your diagnosis return.  You develop worsening fever, fatigue, headache, weight loss or pain in your jaw.  You develop signs of infection. Infections can be worse if you are on corticosteroid medication.  You develop any new or unexplained symptoms of disease. SEEK IMMEDIATE MEDICAL CARE IF:   Your eyesight changes.  Pain does not go away, even after taking medication.  You feel pain in your chest or abdomen.  You have trouble breathing.  One side of your face or body becomes suddenly weak or numb.  Your nose bleeds.  There is blood in your urine.  You develop a fever of more than 102 F (38.9 C). Document Released: 07/18/2009 Document Revised: 12/14/2011 Document Reviewed: 07/18/2009 [facility] Patient Information 2014 [place].

## 2013-12-14 NOTE — ED Notes (Signed)
Pt from the laundry mat w/ bilat chronic pain and extremity pain d/t chronic vasculitis - pt also admits to cocaine abuse approx 1300. Pt has run out of her pain medication.

## 2013-12-14 NOTE — ED Notes (Signed)
Pt has multiple black spots on her ears and on her left thigh.  Pt is tearful.

## 2013-12-15 NOTE — ED Provider Notes (Signed)
CSN: [id number]     Arrival date & time 12/14/13  0208 History   First MD Initiated Contact with Patient 12/14/13 0501     Chief Complaint  Patient presents with  . Otalgia  . Extremity Pain  . Wound Check     (Consider location/radiation/quality/duration/timing/severity/associated sxs/prior Treatment) HPI Comments: Patient with cocaine abuse hx, chronic vasculitis comes in with bilateral ear pain. Pain started few days back, but has progressed gradually, and is now severe. She reports that there is increased skin hyperpigmentation around her ulcers as well. Pt was taking her pain meds for pain relief, but has run out. No n/v/f/c. No bleeding, no purulence.  Patient is a 53 y.o. female presenting with ear pain, extremity pain, and wound check. The history is provided by the patient.  Otalgia Extremity Pain Pertinent negatives include no chest pain and no shortness of breath.  Wound Check Pertinent negatives include no chest pain and no shortness of breath.    Past Medical History  Diagnosis Date  . 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 Followed by Dr. Louanne Skye  . Hypertension   . Cocaine abuse     ongoing with resultant vaculitis.  . Tobacco abuse   . Depression   . Normocytic anemia     BL Hgb 9.8-12. Last anemia panel 04/2010 - showing Fe 19, ferritin 101.  Pt on monthly B12 injections  . Rheumatoid arthritis(714.0)     patient reported  . Inflammatory arthritis   . QQPYPPJK(932.6)    Past Surgical History  Procedure Laterality Date  . Skin biopsy      bilateral shin nodules on 04/2010  . Hernia repair    . Irrigation and debridement abscess Bilateral 09/26/2013    Procedure: DEBRIDEMENT ULCERS BILATERAL THIGHS;  Surgeon: Gwenyth Ober, MD;  Location: [facility] OR;  Service: General;  Laterality: Bilateral;   Family History  Problem Relation Age of Onset  . Breast cancer Mother     Breast cancer  . Alcohol abuse Mother   . Colon cancer Maternal Aunt 34  . Alcohol abuse  Father    History  Substance Use Topics  . Smoking status: Current Every Day Smoker -- 0.50 packs/day for 39 years    Types: Cigarettes  . Smokeless tobacco: Never Used     Comment: trying  . Alcohol Use: No   OB History   Grav Para Term Preterm Abortions TAB SAB Ect Mult Living                 Review of Systems  HENT: Positive for ear pain.   Respiratory: Negative for shortness of breath.   Cardiovascular: Negative for chest pain.  Skin: Positive for color change and wound.      Allergies  Acetaminophen  Home Medications   Current Outpatient Rx  Name  Route  Sig  Dispense  Refill  . amantadine (SYMMETREL) 100 MG capsule   Oral   Take 1 capsule (100 mg total) by mouth 2 (two) times daily.   60 capsule   0   . buPROPion (WELLBUTRIN) 100 MG tablet   Oral   Take 1 tablet (100 mg total) by mouth 2 (two) times daily.   60 tablet   0   . hydrOXYzine (ATARAX/VISTARIL) 25 MG tablet   Oral   Take 1 tablet (25 mg total) by mouth every 6 (six) hours as needed for anxiety.   14 tablet   0   . LORazepam (ATIVAN) 0.5 MG  tablet   Oral   Take 1 tablet (0.5 mg total) by mouth 2 (two) times daily as needed for anxiety.   10 tablet   0   . mirtazapine (REMERON) 7.5 MG tablet   Oral   Take 1 tablet (7.5 mg total) by mouth at bedtime.   30 tablet   0   . risperiDONE (RISPERDAL) 0.25 MG tablet   Oral   Take 1 tablet (0.25 mg total) by mouth 2 (two) times daily.   60 tablet   0   . traZODone (DESYREL) 50 MG tablet   Oral   Take 1 tablet (50 mg total) by mouth at bedtime.   14 tablet   0   . ibuprofen (ADVIL,MOTRIN) 400 MG tablet   Oral   Take 1 tablet (400 mg total) by mouth every 6 (six) hours as needed.   30 tablet   0   . predniSONE (DELTASONE) 50 MG tablet   Oral   Take 1 tablet (50 mg total) by mouth daily.   5 tablet   0   . traMADol (ULTRAM) 50 MG tablet   Oral   Take 1 tablet (50 mg total) by mouth every 6 (six) hours as needed.   15 tablet    0    BP 150/85  Pulse 86  Temp(Src) 98.8 F (37.1 C) (Oral)  Resp 16  Ht 5' 1"  (1.549 m)  Wt 100 lb (45.36 kg)  BMI 18.90 kg/m2  SpO2 100% Physical Exam  Constitutional: She is oriented to person, place, and time. She appears well-developed and well-nourished.  HENT:  Head: Normocephalic and atraumatic.  Hyperpigmented, necrotic appearing skin over the helix of bilateral ears. No purulence, crepitus, bleeding.  Eyes: EOM are normal. Pupils are equal, round, and reactive to light.  Neck: Neck supple.  Cardiovascular: Normal rate, regular rhythm and normal heart sounds.   No murmur heard. Pulmonary/Chest: Effort normal. No respiratory distress.  Abdominal: Soft. She exhibits no distension. There is no tenderness. There is no rebound and no guarding.  Neurological: She is alert and oriented to person, place, and time.  Skin: Skin is warm and dry.    ED Course  Procedures (including critical care time) Labs Review Labs Reviewed  CBC WITH DIFFERENTIAL - Abnormal; Notable for the following:    RBC 3.59 (*)    Hemoglobin 10.3 (*)    HCT 30.3 (*)    All other components within normal limits  BASIC METABOLIC PANEL - Abnormal; Notable for the following:    Potassium 3.6 (*)    Glucose, Bld 101 (*)    All other components within normal limits   Imaging Review No results found.   EKG Interpretation None      MDM   Final diagnoses:  Vasculitis  Ulcer of skin    Pt with flare up of her chronic vasculitis ulcer. Ongoing cocaine use probably exacerbating. Topical meds ordered for pain relief, and we discharged her with the medicine for pain relief upon discharge. Pain better prior to discharge. Few norco written for as well.  Varney Biles, MD 12/15/13 2107

## 2014-01-13 ENCOUNTER — Encounter (HOSPITAL_COMMUNITY): Payer: Self-pay | Admitting: Emergency Medicine

## 2014-01-13 ENCOUNTER — Encounter (HOSPITAL_COMMUNITY): Payer: Self-pay | Admitting: *Deleted

## 2014-01-13 ENCOUNTER — Inpatient Hospital Stay (HOSPITAL_COMMUNITY)
Admission: AD | Admit: 2014-01-13 | Discharge: 2014-01-26 | DRG: 885 | Disposition: A | Discharge status: Rehabilitation Facility | Payer: Medicaid Other | Source: Intra-hospital | Attending: Psychiatry | Admitting: Psychiatry

## 2014-01-13 ENCOUNTER — Emergency Department (HOSPITAL_COMMUNITY)
Admission: EM | Admit: 2014-01-13 | Discharge: 2014-01-13 | Disposition: A | Discharge status: Other Acute Inpatient Hospital | Payer: Medicaid Other | Attending: Emergency Medicine | Admitting: Emergency Medicine

## 2014-01-13 DIAGNOSIS — M069 Rheumatoid arthritis, unspecified: Secondary | ICD-10-CM | POA: Diagnosis present

## 2014-01-13 DIAGNOSIS — Z5987 Material hardship due to limited financial resources, not elsewhere classified: Secondary | ICD-10-CM

## 2014-01-13 DIAGNOSIS — Z803 Family history of malignant neoplasm of breast: Secondary | ICD-10-CM

## 2014-01-13 DIAGNOSIS — Z5989 Other problems related to housing and economic circumstances: Secondary | ICD-10-CM | POA: Diagnosis not present

## 2014-01-13 DIAGNOSIS — F911 Conduct disorder, childhood-onset type: Secondary | ICD-10-CM | POA: Insufficient documentation

## 2014-01-13 DIAGNOSIS — F1420 Cocaine dependence, uncomplicated: Secondary | ICD-10-CM | POA: Insufficient documentation

## 2014-01-13 DIAGNOSIS — H74329 Partial loss of ear ossicles, unspecified ear: Secondary | ICD-10-CM | POA: Insufficient documentation

## 2014-01-13 DIAGNOSIS — F1994 Other psychoactive substance use, unspecified with psychoactive substance-induced mood disorder: Secondary | ICD-10-CM

## 2014-01-13 DIAGNOSIS — D649 Anemia, unspecified: Secondary | ICD-10-CM | POA: Diagnosis present

## 2014-01-13 DIAGNOSIS — F332 Major depressive disorder, recurrent severe without psychotic features: Secondary | ICD-10-CM

## 2014-01-13 DIAGNOSIS — R45851 Suicidal ideations: Secondary | ICD-10-CM | POA: Insufficient documentation

## 2014-01-13 DIAGNOSIS — I776 Arteritis, unspecified: Secondary | ICD-10-CM | POA: Diagnosis present

## 2014-01-13 DIAGNOSIS — Z862 Personal history of diseases of the blood and blood-forming organs and certain disorders involving the immune mechanism: Secondary | ICD-10-CM | POA: Insufficient documentation

## 2014-01-13 DIAGNOSIS — L539 Erythematous condition, unspecified: Secondary | ICD-10-CM | POA: Insufficient documentation

## 2014-01-13 DIAGNOSIS — F1910 Other psychoactive substance abuse, uncomplicated: Secondary | ICD-10-CM

## 2014-01-13 DIAGNOSIS — F17200 Nicotine dependence, unspecified, uncomplicated: Secondary | ICD-10-CM | POA: Insufficient documentation

## 2014-01-13 DIAGNOSIS — F329 Major depressive disorder, single episode, unspecified: Secondary | ICD-10-CM | POA: Diagnosis present

## 2014-01-13 DIAGNOSIS — Z91199 Patient's noncompliance with other medical treatment and regimen due to unspecified reason: Secondary | ICD-10-CM | POA: Diagnosis not present

## 2014-01-13 DIAGNOSIS — Z598 Other problems related to housing and economic circumstances: Secondary | ICD-10-CM

## 2014-01-13 DIAGNOSIS — IMO0002 Reserved for concepts with insufficient information to code with codable children: Secondary | ICD-10-CM | POA: Insufficient documentation

## 2014-01-13 DIAGNOSIS — F29 Unspecified psychosis not due to a substance or known physiological condition: Secondary | ICD-10-CM | POA: Insufficient documentation

## 2014-01-13 DIAGNOSIS — I10 Essential (primary) hypertension: Secondary | ICD-10-CM | POA: Insufficient documentation

## 2014-01-13 DIAGNOSIS — Z800 Family history of malignant neoplasm of digestive organs: Secondary | ICD-10-CM

## 2014-01-13 DIAGNOSIS — Z9119 Patient's noncompliance with other medical treatment and regimen: Secondary | ICD-10-CM | POA: Diagnosis not present

## 2014-01-13 DIAGNOSIS — F323 Major depressive disorder, single episode, severe with psychotic features: Secondary | ICD-10-CM | POA: Insufficient documentation

## 2014-01-13 DIAGNOSIS — G8929 Other chronic pain: Secondary | ICD-10-CM | POA: Diagnosis present

## 2014-01-13 DIAGNOSIS — M064 Inflammatory polyarthropathy: Secondary | ICD-10-CM | POA: Insufficient documentation

## 2014-01-13 DIAGNOSIS — Z79899 Other long term (current) drug therapy: Secondary | ICD-10-CM | POA: Insufficient documentation

## 2014-01-13 DIAGNOSIS — F3289 Other specified depressive episodes: Secondary | ICD-10-CM | POA: Diagnosis present

## 2014-01-13 DIAGNOSIS — F411 Generalized anxiety disorder: Secondary | ICD-10-CM | POA: Diagnosis present

## 2014-01-13 DIAGNOSIS — G4700 Insomnia, unspecified: Secondary | ICD-10-CM | POA: Diagnosis present

## 2014-01-13 DIAGNOSIS — F333 Major depressive disorder, recurrent, severe with psychotic symptoms: Secondary | ICD-10-CM | POA: Diagnosis not present

## 2014-01-13 DIAGNOSIS — F339 Major depressive disorder, recurrent, unspecified: Secondary | ICD-10-CM | POA: Diagnosis present

## 2014-01-13 DIAGNOSIS — L98499 Non-pressure chronic ulcer of skin of other sites with unspecified severity: Secondary | ICD-10-CM | POA: Insufficient documentation

## 2014-01-13 DIAGNOSIS — F39 Unspecified mood [affective] disorder: Secondary | ICD-10-CM | POA: Diagnosis not present

## 2014-01-13 LAB — CBC
HEMATOCRIT: 35.8 % — AB (ref 36.0–46.0)
Hemoglobin: 12.3 g/dL (ref 12.0–15.0)
MCH: 28.7 pg (ref 26.0–34.0)
MCHC: 34.4 g/dL (ref 30.0–36.0)
MCV: 83.6 fL (ref 78.0–100.0)
PLATELETS: 285 10*3/uL (ref 150–400)
RBC: 4.28 MIL/uL (ref 3.87–5.11)
RDW: 13.9 % (ref 11.5–15.5)
WBC: 4.3 10*3/uL (ref 4.0–10.5)

## 2014-01-13 LAB — RAPID URINE DRUG SCREEN, HOSP PERFORMED
AMPHETAMINES: NOT DETECTED
BENZODIAZEPINES: NOT DETECTED
Barbiturates: NOT DETECTED
COCAINE: POSITIVE — AB
OPIATES: NOT DETECTED
Tetrahydrocannabinol: NOT DETECTED

## 2014-01-13 LAB — COMPREHENSIVE METABOLIC PANEL
ALBUMIN: 3.6 g/dL (ref 3.5–5.2)
ALT: 10 U/L (ref 0–35)
AST: 21 U/L (ref 0–37)
Alkaline Phosphatase: 89 U/L (ref 39–117)
BUN: 13 mg/dL (ref 6–23)
CALCIUM: 9.5 mg/dL (ref 8.4–10.5)
CHLORIDE: 98 meq/L (ref 96–112)
CO2: 22 mEq/L (ref 19–32)
CREATININE: 0.85 mg/dL (ref 0.50–1.10)
GFR calc Af Amer: >90 mL/min (ref >90)
GFR calc non Af Amer: 79 mL/min — ABNORMAL LOW (ref >90)
Glucose, Bld: 141 mg/dL — ABNORMAL HIGH (ref 70–99)
Potassium: 3.9 mEq/L (ref 3.7–5.3)
Sodium: 134 mEq/L — ABNORMAL LOW (ref 137–147)
TOTAL PROTEIN: 7.6 g/dL (ref 6.0–8.3)
Total Bilirubin: 0.4 mg/dL (ref 0.3–1.2)

## 2014-01-13 LAB — ACETAMINOPHEN LEVEL: Acetaminophen (Tylenol), Serum: <15 ug/mL (ref 10–30)

## 2014-01-13 LAB — SALICYLATE LEVEL

## 2014-01-13 LAB — ETHANOL

## 2014-01-13 MED ORDER — BUPROPION HCL 100 MG PO TABS
100.0000 mg | ORAL_TABLET | Freq: Two times a day (BID) | ORAL | Status: DC
  Administered 2014-01-13: 100 mg via ORAL
  Filled 2014-01-13 (×2): qty 1

## 2014-01-13 MED ORDER — LORAZEPAM 0.5 MG PO TABS: 0.5000 mg | ORAL_TABLET | Freq: Two times a day (BID) | ORAL | Status: DC | PRN

## 2014-01-13 MED ORDER — RISPERIDONE 0.5 MG PO TABS
ORAL_TABLET | ORAL | Status: AC
  Administered 2014-01-14: 0.25 mg via ORAL
  Filled 2014-01-13: qty 1

## 2014-01-13 MED ORDER — RISPERIDONE 0.25 MG PO TABS
0.2500 mg | ORAL_TABLET | Freq: Two times a day (BID) | ORAL | Status: DC
  Administered 2014-01-14 – 2014-01-26 (×25): 0.25 mg via ORAL
  Filled 2014-01-13: qty 1
  Filled 2014-01-13: qty 28
  Filled 2014-01-13 (×5): qty 1
  Filled 2014-01-13: qty 28
  Filled 2014-01-13 (×16): qty 1
  Filled 2014-01-13: qty 28
  Filled 2014-01-13 (×2): qty 1
  Filled 2014-01-13: qty 28
  Filled 2014-01-13 (×2): qty 1

## 2014-01-13 MED ORDER — HYDROXYZINE HCL 25 MG PO TABS
25.0000 mg | ORAL_TABLET | Freq: Four times a day (QID) | ORAL | Status: DC | PRN
  Administered 2014-01-19: 25 mg via ORAL
  Filled 2014-01-13 (×2): qty 1

## 2014-01-13 MED ORDER — BUPROPION HCL 100 MG PO TABS
100.0000 mg | ORAL_TABLET | Freq: Every day | ORAL | Status: DC
  Administered 2014-01-14 – 2014-01-26 (×13): 100 mg via ORAL
  Filled 2014-01-13: qty 1
  Filled 2014-01-13: qty 14
  Filled 2014-01-13 (×14): qty 1
  Filled 2014-01-13: qty 14

## 2014-01-13 MED ORDER — ALUM & MAG HYDROXIDE-SIMETH 200-200-20 MG/5ML PO SUSP
30.0000 mL | ORAL | Status: DC | PRN
  Administered 2014-01-18: 30 mL via ORAL

## 2014-01-13 MED ORDER — MIRTAZAPINE 7.5 MG PO TABS
7.5000 mg | ORAL_TABLET | Freq: Every day | ORAL | Status: DC
  Administered 2014-01-14 – 2014-01-25 (×12): 7.5 mg via ORAL
  Filled 2014-01-13 (×3): qty 1
  Filled 2014-01-13: qty 14
  Filled 2014-01-13 (×8): qty 1
  Filled 2014-01-13: qty 14
  Filled 2014-01-13 (×4): qty 1

## 2014-01-13 MED ORDER — MIRTAZAPINE 7.5 MG PO TABS
7.5000 mg | ORAL_TABLET | Freq: Every day | ORAL | Status: DC
Start: 2014-01-13 — End: 2014-01-13
  Filled 2014-01-13: qty 1

## 2014-01-13 MED ORDER — MAGNESIUM HYDROXIDE 400 MG/5ML PO SUSP
30.0000 mL | Freq: Every day | ORAL | Status: DC | PRN
  Administered 2014-01-21 – 2014-01-23 (×2): 30 mL via ORAL

## 2014-01-13 MED ORDER — BACITRACIN 500 UNIT/GM EX OINT
1.0000 "application " | TOPICAL_OINTMENT | Freq: Two times a day (BID) | CUTANEOUS | Status: DC
  Administered 2014-01-14 – 2014-01-25 (×22): 1 via TOPICAL
  Filled 2014-01-13 (×3): qty 0.9
  Filled 2014-01-13: qty 9
  Filled 2014-01-13 (×9): qty 0.9
  Filled 2014-01-13: qty 9
  Filled 2014-01-13 (×6): qty 0.9
  Filled 2014-01-13: qty 9
  Filled 2014-01-13 (×2): qty 0.9
  Filled 2014-01-13: qty 9
  Filled 2014-01-13 (×5): qty 0.9

## 2014-01-13 MED ORDER — BACITRACIN 500 UNIT/GM EX OINT
TOPICAL_OINTMENT | Freq: Two times a day (BID) | CUTANEOUS | Status: DC
  Administered 2014-01-13: 1 via TOPICAL
  Filled 2014-01-13: qty 1
  Filled 2014-01-13: qty 0.9
  Filled 2014-01-13: qty 1

## 2014-01-13 MED ORDER — TRAMADOL HCL 50 MG PO TABS
50.0000 mg | ORAL_TABLET | Freq: Four times a day (QID) | ORAL | Status: DC | PRN
  Administered 2014-01-13 (×2): 50 mg via ORAL
  Filled 2014-01-13 (×2): qty 1

## 2014-01-13 MED ORDER — LORAZEPAM 1 MG PO TABS
2.0000 mg | ORAL_TABLET | Freq: Once | ORAL | Status: AC
  Administered 2014-01-13: 2 mg via ORAL
  Filled 2014-01-13: qty 2

## 2014-01-13 MED ORDER — TRAZODONE HCL 50 MG PO TABS: 50.0000 mg | ORAL_TABLET | Freq: Every day | ORAL | Status: DC

## 2014-01-13 MED ORDER — BUPROPION HCL 100 MG PO TABS
100.0000 mg | ORAL_TABLET | Freq: Every day | ORAL | Status: DC
  Filled 2014-01-13: qty 1

## 2014-01-13 MED ORDER — TRAMADOL HCL 50 MG PO TABS
50.0000 mg | ORAL_TABLET | Freq: Four times a day (QID) | ORAL | Status: DC | PRN
  Administered 2014-01-14 – 2014-01-24 (×9): 50 mg via ORAL
  Filled 2014-01-13 (×9): qty 1

## 2014-01-13 MED ORDER — TRAZODONE HCL 50 MG PO TABS
50.0000 mg | ORAL_TABLET | Freq: Every day | ORAL | Status: DC
  Administered 2014-01-14 – 2014-01-25 (×12): 50 mg via ORAL
  Filled 2014-01-13 (×5): qty 1
  Filled 2014-01-13: qty 14
  Filled 2014-01-13 (×2): qty 1
  Filled 2014-01-13: qty 14
  Filled 2014-01-13 (×7): qty 1

## 2014-01-13 MED ORDER — RISPERIDONE 0.5 MG PO TABS
0.2500 mg | ORAL_TABLET | Freq: Two times a day (BID) | ORAL | Status: DC
  Administered 2014-01-13: 0.25 mg via ORAL
  Filled 2014-01-13: qty 1

## 2014-01-13 MED ORDER — HYDROXYZINE HCL 25 MG PO TABS
25.0000 mg | ORAL_TABLET | Freq: Four times a day (QID) | ORAL | Status: DC | PRN
  Administered 2014-01-13: 25 mg via ORAL
  Filled 2014-01-13: qty 1

## 2014-01-13 NOTE — ED Notes (Signed)
Pt arrived to the ED with a complaint of ear pain.  Her family states that the pt has been non compliant with her medication for three weeks.  Pt is tearful in triage.

## 2014-01-13 NOTE — BH Assessment (Signed)
Assessment Note  [name] is an 53 y.o. female.   What led to ER visit  Pt came to ER with a plan to shoot self with a gun.  Pt verbalized she can get access to a gun.  Pt is negative for drugs and alcohol.  Pt has been off medications but unclear what psych hx is with pt.  Pt is irritable and not cooperative with assessment except with closed responses.  Pt denies attempting to harm self in the past.  Pt denies [facility] service or plan or intent to harm others.  Pt cannot or will not elaborate on what brought her in to the ER today.  Pt is not clear on whether she wants help or not.   Suicidality:  Pt cannot contract for safety at this time with TTS due to being uncooperative and lacking insight.  Homicidality:  Pt denies intent or plan or thoughts to harm others  SA:  Pt denies SA issues and alcohol and UDS is negative.    MSE by TTS:  Pt made poor eye contact, speech was garbled and soft, Ox3, disheveled in appearance, pt was not rude but irritable and thought process appeared impaired.  Recommendations:  Psychiatry to round on pt to determine best plan for pt.    Axis I: Major Depression, Recurrent severe Axis II: Deferred Axis III:  Past Medical History  Diagnosis Date  . 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 Followed by Dr. Louanne Skye  . Hypertension   . Cocaine abuse     ongoing with resultant vaculitis.  . Tobacco abuse   . Depression   . Normocytic anemia     BL Hgb 9.8-12. Last anemia panel 04/2010 - showing Fe 19, ferritin 101.  Pt on monthly B12 injections  . Rheumatoid arthritis(714.0)     patient reported  . Inflammatory arthritis   . Headache(784.0)    Axis IV: other psychosocial or environmental problems, problems related to social environment and problems with primary support group Axis V: 41-50 serious symptoms  Past Medical History:  Past Medical History  Diagnosis Date  . 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 Followed by Dr. Louanne Skye  .  Hypertension   . Cocaine abuse     ongoing with resultant vaculitis.  . Tobacco abuse   . Depression   . Normocytic anemia     BL Hgb 9.8-12. Last anemia panel 04/2010 - showing Fe 19, ferritin 101.  Pt on monthly B12 injections  . Rheumatoid arthritis(714.0)     patient reported  . Inflammatory arthritis   . OLMBEMLJ(449.2)     Past Surgical History  Procedure Laterality Date  . Skin biopsy      bilateral shin nodules on 04/2010  . Hernia repair    . Irrigation and debridement abscess Bilateral 09/26/2013    Procedure: DEBRIDEMENT ULCERS BILATERAL THIGHS;  Surgeon: Gwenyth Ober, MD;  Location: [place];  Service: General;  Laterality: Bilateral;    Family History:  Family History  Problem Relation Age of Onset  . Breast cancer Mother     Breast cancer  . Alcohol abuse Mother   . Colon cancer Maternal Aunt 35  . Alcohol abuse Father     Social History:  reports that she has been smoking Cigarettes.  She has a 19.5 pack-year smoking history. She has never used smokeless tobacco. She reports that she uses illicit drugs ("Crack" cocaine, Cocaine, and Marijuana). She reports that she does not drink  alcohol.  Additional Social History:  Alcohol / Drug Use Pain Medications: na Prescriptions: na Over the Counter: na History of alcohol / drug use?: Yes Longest period of sobriety (when/how long): pt denies Substance #1 Name of Substance 1: na 1 - Age of First Use: na 1 - Amount (size/oz): na 1 - Frequency: na 1 - Duration: na 1 - Last Use / Amount: na Substance #2 Name of Substance 2: na 2 - Age of First Use: na 2 - Amount (size/oz): na 2 - Frequency: na 2 - Duration: na 2 - Last Use / Amount: na  CIWA: CIWA-Ar BP: 148/89 mmHg Pulse Rate: 98 COWS:    Allergies:  Allergies  Allergen Reactions  . Acetaminophen Swelling    REACTION: eyelid swelling    Home Medications:  (Not in a hospital admission)  OB/GYN Status:  No LMP recorded. Patient is  postmenopausal.  General Assessment Data Location of Assessment: [facility] ED Is this a Tele or Face-to-Face Assessment?: Face-to-Face Is this an Initial Assessment or a Re-assessment for this encounter?: Initial Assessment Living Arrangements: Non-relatives/Friends Can pt return to current living arrangement?: Yes Admission Status: Voluntary Is patient capable of signing voluntary admission?: Yes Transfer from: [facility] Referral Source: MD  Medical Screening Exam ([place]) Medical Exam completed: Yes  [place] Living Arrangements: Non-relatives/Friends Name of Psychiatrist: na Name of Therapist: na  Education Status Is patient currently in school?: No Current Grade: na Highest grade of school patient has completed: na Name of school: na Contact person: na  Risk to self Suicidal Ideation: Yes-Currently Present Suicidal Intent: Yes-Currently Present Is patient at risk for suicide?: Yes Suicidal Plan?: Yes-Currently Present Specify Current Suicidal Plan: to shoot self Access to Means: Yes (can always get a weapon) Specify Access to Suicidal Means: can access it but doesn't own one What has been your use of drugs/alcohol within the last 12 months?: UDS is neg Previous Attempts/Gestures: No How many times?: 0 Other Self Harm Risks: na Triggers for Past Attempts: None known Intentional Self Injurious Behavior: None Family Suicide History: Unknown Recent stressful life event(s): Conflict (Comment) Persecutory voices/beliefs?: No Depression: Yes Depression Symptoms: Isolating;Fatigue;Guilt;Loss of interest in usual pleasures;Feeling worthless/self pity;Feeling angry/irritable;Despondent Substance abuse history and/or treatment for substance abuse?: No Suicide prevention information given to non-admitted patients: Not applicable  Risk to Others Homicidal Ideation: No Thoughts of Harm to Others: No Current Homicidal Intent: No Current Homicidal Plan:  No Access to Homicidal Means: No Identified Victim: na History of harm to others?: No Assessment of Violence: None Noted Violent Behavior Description: na Does patient have access to weapons?: Yes (Comment) (pt states she doesn't own a gun but can get one) Criminal Charges Pending?: No Does patient have a court date: No  Psychosis Hallucinations: None noted Delusions: None noted  Mental Status Report Appear/Hygiene: Disheveled;Poor hygiene Eye Contact: Poor Motor Activity: Restlessness Speech: Soft;Tangential Level of Consciousness: Quiet/awake;Restless Mood: Depressed;Anxious;Irritable Affect: Anxious;Irritable;Sad Anxiety Level: Moderate Thought Processes: Coherent Judgement: Impaired Orientation: Person;Place;Situation;Appropriate for developmental age Obsessive Compulsive Thoughts/Behaviors: None  Cognitive Functioning Concentration: Decreased Memory: Recent Intact;Remote Intact IQ: Average Insight: Poor Impulse Control: Poor Appetite: Fair Weight Loss: 0 Weight Gain: 0 Sleep: Decreased Total Hours of Sleep: 4 Vegetative Symptoms: None  ADLScreening [facility] Assessment Services) Patient's cognitive ability adequate to safely complete daily activities?: Yes Patient able to express need for assistance with ADLs?: No Independently performs ADLs?: Yes (appropriate for developmental age)  Prior Inpatient Therapy Prior Inpatient Therapy: No Prior Therapy Dates:  na Prior Therapy Facilty/Provider(s): na Reason for Treatment: na  Prior Outpatient Therapy Prior Outpatient Therapy: No Prior Therapy Dates: na Prior Therapy Facilty/Provider(s): na Reason for Treatment: na  ADL Screening (condition at time of admission) Patient's cognitive ability adequate to safely complete daily activities?: Yes Is the patient deaf or have difficulty hearing?: No Does the patient have difficulty seeing, even when wearing glasses/contacts?: No Does the patient have difficulty  concentrating, remembering, or making decisions?: Yes Patient able to express need for assistance with ADLs?: No Does the patient have difficulty dressing or bathing?: No Independently performs ADLs?: Yes (appropriate for developmental age) Does the patient have difficulty walking or climbing stairs?: No Weakness of Legs: None Weakness of Arms/Hands: None  Home Assistive Devices/Equipment Home Assistive Devices/Equipment: None  Therapy Consults (therapy consults require a physician order) PT Evaluation Needed: No OT Evalulation Needed: No SLP Evaluation Needed: No Abuse/Neglect Assessment (Assessment to be complete while patient is alone) Physical Abuse: Denies Verbal Abuse: Denies Sexual Abuse: Denies Exploitation of patient/patient's resources: Denies Self-Neglect: Denies Values / Beliefs Cultural Requests During Hospitalization: None Spiritual Requests During Hospitalization: None Consults Spiritual Care Consult Needed: No Social Work Consult Needed: No Regulatory affairs officer (For Healthcare) Advance Directive: Patient does not have advance directive Pre-existing out of facility DNR order (yellow form or pink MOST form): No    Additional Information 1:1 In Past 12 Months?: No CIRT Risk: No Elopement Risk: No Does patient have medical clearance?: Yes     Disposition:  Disposition Initial Assessment Completed for this Encounter: Yes Disposition of Patient: Other dispositions (psychiatry to round on pt)  On Site Evaluation by:   Reviewed with Physician:    John Giovanni. 01/13/2014 8:06 AM

## 2014-01-13 NOTE — ED Provider Notes (Signed)
CSN: [id number]     Arrival date & time 01/13/14  4970 History   First MD Initiated Contact with Patient 01/13/14 [id number]     Chief Complaint  Patient presents with  . Medical Clearance  . Otalgia     (Consider location/radiation/quality/duration/timing/severity/associated sxs/prior Treatment) HPI Comments: Patient with a history of vasculitis, mainly effecting her ears comes in stating, that she has not had her medications for a while because when she went to fill them.  They wouldn't give them to her.  She also states, that she had a gun she would kill himself if she is tired of the pain.  Patient is a 53 y.o. female presenting with ear pain. The history is provided by the patient.  Otalgia Location:  Bilateral Quality:  Aching Severity:  Severe Timing:  Constant Chronicity:  Chronic Relieved by:  None tried Worsened by:  Nothing tried Ineffective treatments:  None tried Associated symptoms: no ear discharge, no fever and no headaches     Past Medical History  Diagnosis Date  . 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 Followed by Dr. Louanne Skye  . Hypertension   . Cocaine abuse     ongoing with resultant vaculitis.  . Tobacco abuse   . Depression   . Normocytic anemia     BL Hgb 9.8-12. Last anemia panel 04/2010 - showing Fe 19, ferritin 101.  Pt on monthly B12 injections  . Rheumatoid arthritis(714.0)     patient reported  . Inflammatory arthritis   . CHYIFOYD(741.2)    Past Surgical History  Procedure Laterality Date  . Skin biopsy      bilateral shin nodules on 04/2010  . Hernia repair    . Irrigation and debridement abscess Bilateral 09/26/2013    Procedure: DEBRIDEMENT ULCERS BILATERAL THIGHS;  Surgeon: Gwenyth Ober, MD;  Location: [facility] OR;  Service: General;  Laterality: Bilateral;   Family History  Problem Relation Age of Onset  . Breast cancer Mother     Breast cancer  . Alcohol abuse Mother   . Colon cancer Maternal Aunt 28  . Alcohol abuse Father    History   Substance Use Topics  . Smoking status: Current Every Day Smoker -- 0.50 packs/day for 39 years    Types: Cigarettes  . Smokeless tobacco: Never Used     Comment: trying  . Alcohol Use: No   OB History   Grav Para Term Preterm Abortions TAB SAB Ect Mult Living                 Review of Systems  Constitutional: Negative for fever.  HENT: Positive for ear pain. Negative for ear discharge.   Skin: Positive for wound.       Chronic ulcerative areas on pinna of the ears.  She has areas of the pinnae that are missing.  This is been a long-standing vascular/vasculitis.  Neurological: Negative for dizziness and headaches.  All other systems reviewed and are negative.     Allergies  Acetaminophen  Home Medications   Current Outpatient Rx  Name  Route  Sig  Dispense  Refill  . amantadine (SYMMETREL) 100 MG capsule   Oral   Take 1 capsule (100 mg total) by mouth 2 (two) times daily.   60 capsule   0   . buPROPion (WELLBUTRIN) 100 MG tablet   Oral   Take 1 tablet (100 mg total) by mouth 2 (two) times daily.   60 tablet   0   .  hydrOXYzine (ATARAX/VISTARIL) 25 MG tablet   Oral   Take 1 tablet (25 mg total) by mouth every 6 (six) hours as needed for anxiety.   14 tablet   0   . ibuprofen (ADVIL,MOTRIN) 400 MG tablet   Oral   Take 1 tablet (400 mg total) by mouth every 6 (six) hours as needed.   30 tablet   0   . LORazepam (ATIVAN) 0.5 MG tablet   Oral   Take 1 tablet (0.5 mg total) by mouth 2 (two) times daily as needed for anxiety.   10 tablet   0   . mirtazapine (REMERON) 7.5 MG tablet   Oral   Take 1 tablet (7.5 mg total) by mouth at bedtime.   30 tablet   0   . predniSONE (DELTASONE) 50 MG tablet   Oral   Take 1 tablet (50 mg total) by mouth daily.   5 tablet   0   . risperiDONE (RISPERDAL) 0.25 MG tablet   Oral   Take 1 tablet (0.25 mg total) by mouth 2 (two) times daily.   60 tablet   0   . traMADol (ULTRAM) 50 MG tablet   Oral   Take 1 tablet  (50 mg total) by mouth every 6 (six) hours as needed.   15 tablet   0   . traZODone (DESYREL) 50 MG tablet   Oral   Take 1 tablet (50 mg total) by mouth at bedtime.   14 tablet   0    BP 159/99  Pulse 99  Temp(Src) 98.4 F (36.9 C) (Oral)  Resp 22  SpO2 100% Physical Exam  Nursing note and vitals reviewed. Constitutional: She appears well-developed. She appears distressed.  HENT:  Head: Normocephalic.  Patient has chronic vascular compromise.  Tube tenderness of both tears.  She has several areas that have necrosis of his area on her right mid oozing of blood  Eyes: Pupils are equal, round, and reactive to light.  Neck: Normal range of motion.  Cardiovascular: Normal rate.   Pulmonary/Chest: Effort normal.  Neurological: She is alert.  Skin: There is erythema.  Psychiatric: Her affect is angry. Her speech is rapid and/or pressured. She is agitated. She expresses impulsivity. She exhibits a depressed mood. She expresses suicidal ideation. She expresses suicidal plans.    ED Course  Procedures (including critical care time) Labs Review Labs Reviewed  CBC - Abnormal; Notable for the following:    HCT 35.8 (*)    All other components within normal limits  ACETAMINOPHEN LEVEL  COMPREHENSIVE METABOLIC PANEL  ETHANOL  SALICYLATE LEVEL  URINE RAPID DRUG SCREEN (HOSP PERFORMED)   Imaging Review No results found.   EKG Interpretation None      MDM  Patient appears uncomfortable.  I ordered her normal home medications, including Ultram for pain.  I've asked that she have a psych evaluation Final diagnoses:  None         Garald Balding, NP 01/13/14 0501

## 2014-01-13 NOTE — ED Notes (Signed)
Pt ambulatory to [facility] w/ [facility], 1 bag of belongings given to [facility].

## 2014-01-13 NOTE — ED Notes (Signed)
Pt sleeping soundly, arousable but will not sit up or take medications at this time.

## 2014-01-13 NOTE — Progress Notes (Signed)
[name], female   DOB: 05/23/1961, 53 y.o.   MRN: [id number] Admit Note. Pt admitted to [facility] Adult unit for voluntary admission. Patient reports she has been off of her medications, abuse crack cocaine and increasingly depressed with suicidal ideations, and auditory and visual hallucinations at times. She reports '' I went to the pharmacy and they wouldn't give me any more refills. So I started using the crack. Then I started hearing voices, telling me to hurt other people or myself '' She continues to endorse passive SI, but is able to contract for safety at this time. She denies any HI at this time. Upon admission skin assessed and noted several wounds/open areas to bilateral ears and nose, as well as her lower legs with newly healed scars. She reports ''that's the vasculitis'' No contraband found. Pt oriented to unit. And High Fall Risk safety measures. Reported off to Donnamarie Poag.  RN.

## 2014-01-13 NOTE — ED Notes (Signed)
Up to the bathroom 

## 2014-01-13 NOTE — Progress Notes (Signed)
[facility] Notes:  (Nursing/MHT/Case Management/Adjunct)  Date:  01/13/2014  Time:  11:14 PM  Type of Therapy:  Group Therapy  Participation Level:  Did Not Attend  Participation Quality:  Did Not Attend  Affect:  Did Not Attend  Cognitive:  Did Not Attend  Insight:  None  Engagement in Group:  Did Not Attend  Modes of Intervention:  Socialization and Support  Summary of Progress/Problems: Pt. Was sleeping in bed.  Lanell Persons 01/13/2014, 11:14 PM

## 2014-01-13 NOTE — Progress Notes (Signed)
Pt accepted to [facility] by Dr. Sabra Heck and will go by [facility] to [facility] to room 307-2.  Pt alert and signed forms and they were faxed to [facility]

## 2014-01-13 NOTE — Consult Note (Signed)
[facility] Consult   Reason for Consult:  Depression with suicidal ideations and a plan Referring Physician:  ED MD [name] is an 53 y.o. female. Total Time spent with patient: 20 minutes  Assessment: AXIS I:  Major Depression, Recurrent severe, Substance Abuse and Substance Induced Mood Disorder AXIS II:  Deferred AXIS III:   Past Medical History  Diagnosis Date  . 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 Followed by Dr. Louanne Skye  . Hypertension   . Cocaine abuse     ongoing with resultant vaculitis.  . Tobacco abuse   . Depression   . Normocytic anemia     BL Hgb 9.8-12. Last anemia panel 04/2010 - showing Fe 19, ferritin 101.  Pt on monthly B12 injections  . Rheumatoid arthritis(714.0)     patient reported  . Inflammatory arthritis   . Headache(784.0)    AXIS IV:  economic problems, housing problems, other psychosocial or environmental problems, problems related to social environment, problems with access to health care services and problems with primary support group AXIS V:  41-50 serious symptoms  Plan:  Recommend psychiatric Inpatient admission when medically cleared.  Dr. Sabra Heck assessed the patient and agrees with the treatment plan to admit the patient to [facility] for depression, suicidal ideations, and cocaine dependency.  Subjective:   [name] is a 53 y.o. female patient admitted with depression and cocaine dependency.  HPI:  Patient tried to get her medications but was too late and has not been taking them since discharge in January.  She is currently depressed with suicidal ideations and a plan to shoot herself.  [name] is visibly upset, crying, distraught.  She wants help for her depression and cocaine abuse.  Denies homicidal ideations or hallucinations.  Currently, homeless with no social support or resources. HPI Elements:   Location:  generalized. Quality:  acute. Severity:  severe. Timing:  constant. Duration:  couple of  weeks. Context:  stressors.  Past Psychiatric History: Past Medical History  Diagnosis Date  . 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 Followed by Dr. Louanne Skye  . Hypertension   . Cocaine abuse     ongoing with resultant vaculitis.  . Tobacco abuse   . Depression   . Normocytic anemia     BL Hgb 9.8-12. Last anemia panel 04/2010 - showing Fe 19, ferritin 101.  Pt on monthly B12 injections  . Rheumatoid arthritis(714.0)     patient reported  . Inflammatory arthritis   . Headache(784.0)     reports that she has been smoking Cigarettes.  She has a 19.5 pack-year smoking history. She has never used smokeless tobacco. She reports that she uses illicit drugs ("Crack" cocaine, Cocaine, and Marijuana). She reports that she does not drink alcohol. Family History  Problem Relation Age of Onset  . Breast cancer Mother     Breast cancer  . Alcohol abuse Mother   . Colon cancer Maternal Aunt 84  . Alcohol abuse Father    Family History Substance Abuse: Yes, Describe: Family Supports: No Living Arrangements: Non-relatives/Friends Can pt return to current living arrangement?: Yes Abuse/Neglect Correct Care Of ) Physical Abuse: Denies Verbal Abuse: Denies Sexual Abuse: Denies Allergies:   Allergies  Allergen Reactions  . Acetaminophen Swelling    REACTION: eyelid swelling    ACT Assessment Complete:  Yes:    Educational Status    Risk to Self: Risk to self Suicidal Ideation: Yes-Currently Present Suicidal Intent: Yes-Currently Present Is patient at risk for  suicide?: Yes Suicidal Plan?: Yes-Currently Present Specify Current Suicidal Plan: to shoot self Access to Means: Yes (can always get a weapon) Specify Access to Suicidal Means: can access it but doesn't own one What has been your use of drugs/alcohol within the last 12 months?: UDS is neg Previous Attempts/Gestures: No How many times?: 0 Other Self Harm Risks: na Triggers for Past Attempts: None known Intentional Self Injurious  Behavior: None Family Suicide History: Unknown Recent stressful life event(s): Conflict (Comment) Persecutory voices/beliefs?: No Depression: Yes Depression Symptoms: Isolating;Fatigue;Guilt;Loss of interest in usual pleasures;Feeling worthless/self pity;Feeling angry/irritable;Despondent Substance abuse history and/or treatment for substance abuse?: No Suicide prevention information given to non-admitted patients: Not applicable  Risk to Others: Risk to Others Homicidal Ideation: No Thoughts of Harm to Others: No Current Homicidal Intent: No Current Homicidal Plan: No Access to Homicidal Means: No Identified Victim: na History of harm to others?: No Assessment of Violence: None Noted Violent Behavior Description: na Does patient have access to weapons?: Yes (Comment) (pt states she doesn't own a gun but can get one) Criminal Charges Pending?: No Does patient have a court date: No  Abuse: Abuse/Neglect Assessment (Assessment to be complete while patient is alone) Physical Abuse: Denies Verbal Abuse: Denies Sexual Abuse: Denies Exploitation of patient/patient's resources: Denies Self-Neglect: Denies  Prior Inpatient Therapy: Prior Inpatient Therapy Prior Inpatient Therapy: No Prior Therapy Dates: na Prior Therapy Facilty/Provider(s): na Reason for Treatment: na  Prior Outpatient Therapy: Prior Outpatient Therapy Prior Outpatient Therapy: No Prior Therapy Dates: na Prior Therapy Facilty/Provider(s): na Reason for Treatment: na  Additional Information: Additional Information 1:1 In Past 12 Months?: No CIRT Risk: No Elopement Risk: No Does patient have medical clearance?: Yes                  Objective: Blood pressure 174/92, pulse 102, temperature 98.8 F (37.1 C), temperature source Oral, resp. rate 18, SpO2 100.00%.There is no weight on file to calculate BMI. Results for orders placed during the hospital encounter of 01/13/14 (from the past 72 hour(s))   URINE RAPID DRUG SCREEN (HOSP PERFORMED)     Status: Abnormal   Collection Time    01/13/14  4:05 AM      Result Value Ref Range   Opiates NONE DETECTED  NONE DETECTED   Cocaine POSITIVE (*) NONE DETECTED   Benzodiazepines NONE DETECTED  NONE DETECTED   Amphetamines NONE DETECTED  NONE DETECTED   Tetrahydrocannabinol NONE DETECTED  NONE DETECTED   Barbiturates NONE DETECTED  NONE DETECTED   Comment:            DRUG SCREEN FOR MEDICAL PURPOSES     ONLY.  IF CONFIRMATION IS NEEDED     FOR ANY PURPOSE, NOTIFY LAB     WITHIN 5 DAYS.                LOWEST DETECTABLE LIMITS     FOR URINE DRUG SCREEN     Drug Class       Cutoff (ng/mL)     Amphetamine      1000     Barbiturate      200     Benzodiazepine   196     Tricyclics       222     Opiates          300     Cocaine          300     THC  Verdi     Status: None   Collection Time    01/13/14  4:35 AM      Result Value Ref Range   Acetaminophen (Tylenol), Serum <15.0  10 - 30 ug/mL   Comment:            THERAPEUTIC CONCENTRATIONS VARY     SIGNIFICANTLY. A RANGE OF 10-30     ug/mL MAY BE AN EFFECTIVE     CONCENTRATION FOR MANY PATIENTS.     HOWEVER, SOME ARE BEST TREATED     AT CONCENTRATIONS OUTSIDE THIS     RANGE.     ACETAMINOPHEN CONCENTRATIONS     >150 ug/mL AT 4 HOURS AFTER     INGESTION AND >50 ug/mL AT 12     HOURS AFTER INGESTION ARE     OFTEN ASSOCIATED WITH TOXIC     REACTIONS.  CBC     Status: Abnormal   Collection Time    01/13/14  4:35 AM      Result Value Ref Range   WBC 4.3  4.0 - 10.5 K/uL   RBC 4.28  3.87 - 5.11 MIL/uL   Hemoglobin 12.3  12.0 - 15.0 g/dL   HCT 35.8 (*) 36.0 - 46.0 %   MCV 83.6  78.0 - 100.0 fL   MCH 28.7  26.0 - 34.0 pg   MCHC 34.4  30.0 - 36.0 g/dL   RDW 13.9  11.5 - 15.5 %   Platelets 285  150 - 400 K/uL  COMPREHENSIVE METABOLIC PANEL     Status: Abnormal   Collection Time    01/13/14  4:35 AM      Result Value Ref Range   Sodium 134 (*) 137 -  147 mEq/L   Potassium 3.9  3.7 - 5.3 mEq/L   Chloride 98  96 - 112 mEq/L   CO2 22  19 - 32 mEq/L   Glucose, Bld 141 (*) 70 - 99 mg/dL   BUN 13  6 - 23 mg/dL   Creatinine, Ser 0.85  0.50 - 1.10 mg/dL   Calcium 9.5  8.4 - 10.5 mg/dL   Total Protein 7.6  6.0 - 8.3 g/dL   Albumin 3.6  3.5 - 5.2 g/dL   AST 21  0 - 37 U/L   ALT 10  0 - 35 U/L   Alkaline Phosphatase 89  39 - 117 U/L   Total Bilirubin 0.4  0.3 - 1.2 mg/dL   GFR calc non Af Amer 79 (*) >90 mL/min   GFR calc Af Amer >90  >90 mL/min   Comment: (NOTE)     The eGFR has been calculated using the CKD EPI equation.     This calculation has not been validated in all clinical situations.     eGFR's persistently <90 mL/min signify possible Chronic Kidney     Disease.  ETHANOL     Status: None   Collection Time    01/13/14  4:35 AM      Result Value Ref Range   Alcohol, Ethyl (B) <11  0 - 11 mg/dL   Comment:            LOWEST DETECTABLE LIMIT FOR     SERUM ALCOHOL IS 11 mg/dL     FOR MEDICAL PURPOSES ONLY  SALICYLATE LEVEL     Status: Abnormal   Collection Time    01/13/14  4:35 AM      Result Value Ref Range   Salicylate  Lvl <2.0 (*) 2.8 - 20.0 mg/dL   Labs are reviewed and are pertinent for no medical issues noted.  Current Facility-Administered Medications  Medication Dose Route Frequency Provider Last Rate Last Dose  . bacitracin ointment   Topical BID Hoy Morn, MD   1 application at 96/29/52 1100  . buPROPion [facility]) tablet 100 mg  100 mg Oral BID Garald Balding, NP   100 mg at 01/13/14 1100  . hydrOXYzine (ATARAX/VISTARIL) tablet 25 mg  25 mg Oral Q6H PRN Garald Balding, NP   25 mg at 01/13/14 0538  . LORazepam (ATIVAN) tablet 0.5 mg  0.5 mg Oral BID PRN Garald Balding, NP      . mirtazapine (REMERON) tablet 7.5 mg  7.5 mg Oral QHS Garald Balding, NP      . risperiDONE (RISPERDAL) tablet 0.25 mg  0.25 mg Oral BID Garald Balding, NP   0.25 mg at 01/13/14 1100  . traMADol (ULTRAM) tablet 50 mg  50 mg Oral Q6H PRN  Garald Balding, NP   50 mg at 01/13/14 1157  . traZODone (DESYREL) tablet 50 mg  50 mg Oral QHS Garald Balding, NP       Current Outpatient Prescriptions  Medication Sig Dispense Refill  . amantadine (SYMMETREL) 100 MG capsule Take 1 capsule (100 mg total) by mouth 2 (two) times daily.  60 capsule  0  . buPROPion (WELLBUTRIN) 100 MG tablet Take 1 tablet (100 mg total) by mouth 2 (two) times daily.  60 tablet  0  . hydrOXYzine (ATARAX/VISTARIL) 25 MG tablet Take 1 tablet (25 mg total) by mouth every 6 (six) hours as needed for anxiety.  14 tablet  0  . LORazepam (ATIVAN) 0.5 MG tablet Take 1 tablet (0.5 mg total) by mouth 2 (two) times daily as needed for anxiety.  10 tablet  0  . mirtazapine (REMERON) 7.5 MG tablet Take 1 tablet (7.5 mg total) by mouth at bedtime.  30 tablet  0  . risperiDONE (RISPERDAL) 0.25 MG tablet Take 1 tablet (0.25 mg total) by mouth 2 (two) times daily.  60 tablet  0  . traMADol (ULTRAM) 50 MG tablet Take 50 mg by mouth every 6 (six) hours as needed for moderate pain.      . traZODone (DESYREL) 50 MG tablet Take 1 tablet (50 mg total) by mouth at bedtime.  14 tablet  0    Psychiatric Specialty Exam:     Blood pressure 174/92, pulse 102, temperature 98.8 F (37.1 C), temperature source Oral, resp. rate 18, SpO2 100.00%.There is no weight on file to calculate BMI.  General Appearance: Disheveled  Eye Contact::  Poor  Speech:  Slurred  Volume:  Normal  Mood:  Depressed and Hopeless  Affect:  Congruent, tearful  Thought Process:  Coherent  Orientation:  Full (Time, Place, and Person)  Thought Content:  Rumination  Suicidal Thoughts:  Yes.  with intent/plan  Homicidal Thoughts:  No  Memory:  Immediate;   Fair Recent;   Fair Remote;   Fair  Judgement:  Poor  Insight:  Fair  Psychomotor Activity:  Decreased  Concentration:  Fair  Recall:  [employer] of Knowledge:Fair  Language: Good  Akathisia:  No  Handed:  Right  AIMS (if indicated):     Assets:   Resilience  Sleep:      Musculoskeletal: Strength & Muscle Tone: within normal limits Gait & Station: normal Patient leans: N/A  Treatment Plan Summary:  Admit to [facility] for mood stability, restart medications from last discharge.  Waylan Boga, PMH-NP 01/13/2014 12:55 PM Personally evaluated the patient participated in the assessment and plan Geralyn Flash A. Sabra Heck, M.D.

## 2014-01-13 NOTE — Tx Team (Signed)
Initial Interdisciplinary Treatment Plan  PATIENT STRENGTHS: (choose at least two) Average or above average intelligence Communication skills General fund of knowledge  PATIENT STRESSORS: Educational concerns Financial difficulties Health problems Medication change or noncompliance Occupational concerns Substance abuse   PROBLEM LIST: Problem List/Patient Goals Date to be addressed Date deferred Reason deferred Estimated date of resolution  depression 01-13-14   dc  Suicide risk 01-13-14   dc  Substance abuse/cocaine  01-13-14   dc                                       DISCHARGE CRITERIA:  Ability to meet basic life and health needs Improved stabilization in mood, thinking, and/or behavior Motivation to continue treatment in a less acute level of care Reduction of life-threatening or endangering symptoms to within safe limits Verbal commitment to aftercare and medication compliance  PRELIMINARY DISCHARGE PLAN: Attend 12-step recovery group Outpatient therapy Placement in alternative living arrangements  PATIENT/FAMIILY INVOLVEMENT: This treatment plan has been presented to and reviewed with the patient, [name], and/or family member, .  The patient and family have been given the opportunity to ask questions and make suggestions.  [name] 01/13/2014, 6:24 PM

## 2014-01-13 NOTE — ED Provider Notes (Signed)
Medical screening examination/treatment/procedure(s) were conducted as a shared visit with non-physician practitioner(s) and myself.  I personally evaluated the patient during the encounter.   EKG Interpretation None      Likely substance induced mood disorder.  Expressing suicidal thoughts at this time.  Patient is extremely tearful.  I will rely on psychiatric consultatant expertise to assist in disposition of this complex patient  Hoy Morn, MD 01/13/14 [phone]

## 2014-01-14 DIAGNOSIS — F333 Major depressive disorder, recurrent, severe with psychotic symptoms: Principal | ICD-10-CM | POA: Diagnosis present

## 2014-01-14 DIAGNOSIS — R45851 Suicidal ideations: Secondary | ICD-10-CM

## 2014-01-14 NOTE — Progress Notes (Signed)
Psychoeducational Group Note  Date:  01/14/2014 Time:  0945 am  Group Topic/Focus:  Making Healthy Choices:   The focus of this group is to help patients identify negative/unhealthy choices they were using prior to admission and identify positive/healthier coping strategies to replace them upon discharge.  Participation Level:  Did Not Attend   Additional Comments:  Video on chemical dependency and discussion  Migdalia Dk 01/14/2014, 1:15 pm

## 2014-01-14 NOTE — H&P (Signed)
Psychiatric Admission Assessment Adult  Patient Identification:  [name] Date of Evaluation:  01/14/2014 Chief Complaint:  "I started feeling suicidal."  History of Present Illness:: [name] is a 53 year old female who presented voluntarily to [facility] expressing intent to shoot self with a gun. Patient has been noncompliant with psychiatric medications. 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 Records in epic indicate this condition is from chronic cocaine use. Patient states today during her psychiatric assessment "I was feeling suicidal. I hurt in my body. I feel depressed. I think the cocaine made it worse. I tried to go get my medications but they would not refill them and I got mad." [name] was irritable during her assessment and appeared to have some trouble processing information. She reports not having gone to any follow up appointments since January. Patient became upset when this was suggested as the cause of her not being able to refill prescriptions. She does admit to hearing voices recently. So far patient has been isolating to her room and is not attending groups. Her hygiene is poor and she is noted to be maloderous.   Elements:  Location:  Depression, Psychosis Quality:  Depression, substance abuse  Severity:  suicidal . Timing:  substance abuse. Duration:  chronic. Context:  acute pain. Associated Signs/Synptoms: Depression Symptoms:  depressed mood, anhedonia, insomnia, psychomotor agitation, feelings of worthlessness/guilt, hopelessness, impaired memory, recurrent thoughts of death, suicidal thoughts with specific plan, weight loss, decreased labido, decreased appetite, (Hypo) Manic Symptoms:  Distractibility, Impulsivity, Irritable Mood, Labiality of Mood, Anxiety Symptoms:  Excessive Worry, Psychotic Symptoms:  Hallucinations: Auditory PTSD Symptoms: NA  Psychiatric Specialty Exam: Physical Exam  Constitutional:  Physical exam  findings reviewed from [facility] on 01/13/14 and I concur with no noted exceptions.   Psychiatric: She is agitated, aggressive and actively hallucinating. She expresses impulsivity. She exhibits a depressed mood. She expresses suicidal ideation. She expresses suicidal plans.    Review of Systems  Constitutional: Positive for malaise/fatigue.  HENT: Negative.   Eyes: Negative.   Respiratory: Negative.   Cardiovascular: Negative.   Gastrointestinal: Negative.   Genitourinary: Negative.   Musculoskeletal: Positive for myalgias.  Skin: Negative.   Neurological: Negative.   Endo/Heme/Allergies: Negative.   Psychiatric/Behavioral: Positive for depression, suicidal ideas, hallucinations and substance abuse. Negative for memory loss. The patient is nervous/anxious and has insomnia.     Blood pressure 134/78, pulse 104, temperature 97.5 F (36.4 C), temperature source Oral, resp. rate 18, height _0  (1.575 m), weight 41.277 kg (91 lb), SpO2 100.00%.Body mass index is 16.64 kg/(m^2).  General Appearance: Disheveled  Eye Sport and exercise psychologist::  Fair  Speech:  Slow  Volume:  Decreased  Mood:  Depressed and Irritable  Affect:  Constricted  Thought Process:  Disorganized  Orientation:  Full (Time, Place, and Person)  Thought Content:  Hallucinations: Auditory  Suicidal Thoughts:  Yes.  without intent/plan  Homicidal Thoughts:  No  Memory:  Immediate;   Fair Recent;   Fair Remote;   Fair  Judgement:  Poor  Insight:  Shallow  Psychomotor Activity:  Decreased  Concentration:  Fair  Recall:  Fair  Akathisia:  No  Handed:  Right  AIMS (if indicated):     Assets:  Communication Skills Desire for Improvement Resilience  Sleep:  Number of Hours: 6.75  Language: [name] of knowledge: Fair  Musculoskeletal:  Strength & Muscle Tone: within normal limits  Gait & Station: normal  Patient leans: N/A  Past Psychiatric History:Yes  Diagnosis: cocaine dependence and depression  Hospitalizations: [facility] several  times   Outpatient Care: [facility]  Substance Abuse Care: [facility]  Self-Mutilation:no  Suicidal Attempts: Denies  Violent Behaviors: no   Past Medical History:   Past Medical History  Diagnosis Date  . 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 Followed by Dr. Louanne Skye  . Hypertension   . Cocaine abuse     ongoing with resultant vaculitis.  . Tobacco abuse   . Depression   . Normocytic anemia     BL Hgb 9.8-12. Last anemia panel 04/2010 - showing Fe 19, ferritin 101.  Pt on monthly B12 injections  . Rheumatoid arthritis(714.0)     patient reported  . Inflammatory arthritis   . Headache(784.0)    None. Allergies:   Allergies  Allergen Reactions  . Acetaminophen Swelling    REACTION: eyelid swelling   PTA Medications: Prescriptions prior to admission  Medication Sig Dispense Refill  . amantadine (SYMMETREL) 100 MG capsule Take 1 capsule (100 mg total) by mouth 2 (two) times daily.  60 capsule  0  . buPROPion (WELLBUTRIN) 100 MG tablet Take 1 tablet (100 mg total) by mouth 2 (two) times daily.  60 tablet  0  . hydrOXYzine (ATARAX/VISTARIL) 25 MG tablet Take 1 tablet (25 mg total) by mouth every 6 (six) hours as needed for anxiety.  14 tablet  0  . LORazepam (ATIVAN) 0.5 MG tablet Take 1 tablet (0.5 mg total) by mouth 2 (two) times daily as needed for anxiety.  10 tablet  0  . mirtazapine (REMERON) 7.5 MG tablet Take 1 tablet (7.5 mg total) by mouth at bedtime.  30 tablet  0  . risperiDONE (RISPERDAL) 0.25 MG tablet Take 1 tablet (0.25 mg total) by mouth 2 (two) times daily.  60 tablet  0  . traMADol (ULTRAM) 50 MG tablet Take 50 mg by mouth every 6 (six) hours as needed for moderate pain.      . traZODone (DESYREL) 50 MG tablet Take 1 tablet (50 mg total) by mouth at bedtime.  14 tablet  0    Previous Psychotropic Medications:  Medication/Dose  See list               Substance Abuse History in the last 12 months:  yes  Consequences of Substance Abuse: Medical  Consequences:  peripheral vasculitis Worsening of mental health, reports hearing voices when using cocaine   Social History:  reports that she has been smoking Cigarettes.  She has a 19.5 pack-year smoking history. She has never used smokeless tobacco. She reports that she uses illicit drugs ("Crack" cocaine, Cocaine, and Marijuana). She reports that she does not drink alcohol. Additional Social History: Pain Medications: na Negative Consequences of Use: Financial;Legal Current Place of Residence:   Place of Birth:   Family Members: Marital Status:  Single Children:  Sons:  Daughters: Relationships: Education:  HS Soil scientist Problems/Performance: Religious Beliefs/Practices: History of Abuse (Emotional/Phsycial/Sexual) Ship broker History:  None. Legal History: Hobbies/Interests:  Family History:   Family History  Problem Relation Age of Onset  . Breast cancer Mother     Breast cancer  . Alcohol abuse Mother   . Colon cancer Maternal Aunt 90  . Alcohol abuse Father     Results for orders placed during the hospital encounter of 01/13/14 (from the past 72 hour(s))  URINE RAPID DRUG SCREEN (HOSP PERFORMED)     Status: Abnormal   Collection Time    01/13/14  4:05  AM      Result Value Ref Range   Opiates NONE DETECTED  NONE DETECTED   Cocaine POSITIVE (*) NONE DETECTED   Benzodiazepines NONE DETECTED  NONE DETECTED   Amphetamines NONE DETECTED  NONE DETECTED   Tetrahydrocannabinol NONE DETECTED  NONE DETECTED   Barbiturates NONE DETECTED  NONE DETECTED   Comment:            DRUG SCREEN FOR MEDICAL PURPOSES     ONLY.  IF CONFIRMATION IS NEEDED     FOR ANY PURPOSE, NOTIFY LAB     WITHIN 5 DAYS.                LOWEST DETECTABLE LIMITS     FOR URINE DRUG SCREEN     Drug Class       Cutoff (ng/mL)     Amphetamine      1000     Barbiturate      200     Benzodiazepine   161     Tricyclics       096     Opiates          300     Cocaine           300     THC              50  ACETAMINOPHEN LEVEL     Status: None   Collection Time    01/13/14  4:35 AM      Result Value Ref Range   Acetaminophen (Tylenol), Serum <15.0  10 - 30 ug/mL   Comment:            THERAPEUTIC CONCENTRATIONS VARY     SIGNIFICANTLY. A RANGE OF 10-30     ug/mL MAY BE AN EFFECTIVE     CONCENTRATION FOR MANY PATIENTS.     HOWEVER, SOME ARE BEST TREATED     AT CONCENTRATIONS OUTSIDE THIS     RANGE.     ACETAMINOPHEN CONCENTRATIONS     >150 ug/mL AT 4 HOURS AFTER     INGESTION AND >50 ug/mL AT 12     HOURS AFTER INGESTION ARE     OFTEN ASSOCIATED WITH TOXIC     REACTIONS.  CBC     Status: Abnormal   Collection Time    01/13/14  4:35 AM      Result Value Ref Range   WBC 4.3  4.0 - 10.5 K/uL   RBC 4.28  3.87 - 5.11 MIL/uL   Hemoglobin 12.3  12.0 - 15.0 g/dL   HCT 35.8 (*) 36.0 - 46.0 %   MCV 83.6  78.0 - 100.0 fL   MCH 28.7  26.0 - 34.0 pg   MCHC 34.4  30.0 - 36.0 g/dL   RDW 13.9  11.5 - 15.5 %   Platelets 285  150 - 400 K/uL  COMPREHENSIVE METABOLIC PANEL     Status: Abnormal   Collection Time    01/13/14  4:35 AM      Result Value Ref Range   Sodium 134 (*) 137 - 147 mEq/L   Potassium 3.9  3.7 - 5.3 mEq/L   Chloride 98  96 - 112 mEq/L   CO2 22  19 - 32 mEq/L   Glucose, Bld 141 (*) 70 - 99 mg/dL   BUN 13  6 - 23 mg/dL   Creatinine, Ser 0.85  0.50 - 1.10 mg/dL   Calcium 9.5  8.4 - 10.5 mg/dL  Total Protein 7.6  6.0 - 8.3 g/dL   Albumin 3.6  3.5 - 5.2 g/dL   AST 21  0 - 37 U/L   ALT 10  0 - 35 U/L   Alkaline Phosphatase 89  39 - 117 U/L   Total Bilirubin 0.4  0.3 - 1.2 mg/dL   GFR calc non Af Amer 79 (*) >90 mL/min   GFR calc Af Amer >90  >90 mL/min   Comment: (NOTE)     The eGFR has been calculated using the CKD EPI equation.     This calculation has not been validated in all clinical situations.     eGFR's persistently <90 mL/min signify possible Chronic Kidney     Disease.  ETHANOL     Status: None   Collection Time     01/13/14  4:35 AM      Result Value Ref Range   Alcohol, Ethyl (B) <11  0 - 11 mg/dL   Comment:            LOWEST DETECTABLE LIMIT FOR     SERUM ALCOHOL IS 11 mg/dL     FOR MEDICAL PURPOSES ONLY  SALICYLATE LEVEL     Status: Abnormal   Collection Time    01/13/14  4:35 AM      Result Value Ref Range   Salicylate Lvl <9.7 (*) 2.8 - 20.0 mg/dL   Psychological Evaluations: Assessment:   DSM5:  Schizophrenia Disorders:   Obsessive-Compulsive Disorders:   Trauma-Stressor Disorders:   Substance/Addictive Disorders:   Depressive Disorders:  Major Depressive Disorder - with Psychotic Features (296.24)  AXIS I:  Major depressive disorder, recurrent episode, severe, specified as with psychotic behavior  AXIS II:  Deferred AXIS III:   Past Medical History  Diagnosis Date  . 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 Followed by Dr. Louanne Skye  . Hypertension   . Cocaine abuse     ongoing with resultant vaculitis.  . Tobacco abuse   . Depression   . Normocytic anemia     BL Hgb 9.8-12. Last anemia panel 04/2010 - showing Fe 19, ferritin 101.  Pt on monthly B12 injections  . Rheumatoid arthritis(714.0)     patient reported  . Inflammatory arthritis   . Headache(784.0)    AXIS IV:  economic problems, educational problems, occupational problems, other psychosocial or environmental problems, problems related to social environment and problems with primary support group AXIS V:  31-40 impairment in reality testing  Treatment Plan/Recommendations:   1. Admit for crisis management and stabilization. Estimated length of stay 5-7 days. 2. Medication management to reduce current symptoms to base line and improve the patient's level of functioning. Trazodone initiated to help improve sleep. 3. Develop treatment plan to decrease risk of relapse upon discharge of depressive and psychotic symptoms and the need for readmission. 5. Group therapy to facilitate development of healthy coping skills to use  for depression and psychosis.  6. Health care follow up as needed for medical problems. Apply Bacitracin ointment twice daily to ulcerative areas on pinna of ears.  7. Discharge plan to include therapy to help patient cope with  stressors.  8. Call for Consult with Hospitalist for additional specialty patient services as needed.   Treatment Plan Summary: Daily contact with patient to assess and evaluate symptoms and progress in treatment Medication management Current Medications:  Current Facility-Administered Medications  Medication Dose Route Frequency Provider Last Rate Last Dose  . alum & mag hydroxide-simeth (MAALOX/MYLANTA) 200-200-20 MG/5ML  suspension 30 mL  30 mL Oral Q4H PRN Waylan Boga, NP      . bacitracin ointment 1 application  1 application Topical BID Waylan Boga, NP   1 application at 68/59/92 0904  . buPROPion [facility]) tablet 100 mg  100 mg Oral QAC breakfast Waylan Boga, NP   100 mg at 01/14/14 0904  . hydrOXYzine (ATARAX/VISTARIL) tablet 25 mg  25 mg Oral Q6H PRN Waylan Boga, NP      . magnesium hydroxide (MILK OF MAGNESIA) suspension 30 mL  30 mL Oral Daily PRN Waylan Boga, NP      . mirtazapine (REMERON) tablet 7.5 mg  7.5 mg Oral QHS Waylan Boga, NP      . risperiDONE (RISPERDAL) tablet 0.25 mg  0.25 mg Oral BID Waylan Boga, NP   0.25 mg at 01/14/14 0904  . traMADol (ULTRAM) tablet 50 mg  50 mg Oral Q6H PRN Waylan Boga, NP   50 mg at 01/14/14 0903  . traZODone (DESYREL) tablet 50 mg  50 mg Oral QHS Waylan Boga, NP        Observation Level/Precautions:  15 minute checks  Laboratory:  Reviewed admission labs  Psychotherapy:  Group and milieu therapy  Medications:  Wellbutrin 100 mg daily for depression, Remeron 7.5 mg for insomnia, Risperdal 0.25 mg BID for psychosis, Trazodone 50 mg hs for insomnia.   Consultations:  As needed  Discharge Concerns:  Continue cocaine abuse   Estimated LOS: 4-7 days  Other:  UDS positive for cocaine   I certify that  inpatient services furnished can reasonably be expected to improve the patient's condition.    Cristina Shiley, NP-C 4/12/20152:08 PM  Patient seen, evaluated and I agree with notes by Nurse Practitioner. Corena Pilgrim, MD

## 2014-01-14 NOTE — Progress Notes (Signed)
  D:Pt observed sleeping in bed with eyes closed. RR even and unlabored. No distress noted.Pt very lethargic the whole night. Pt  Not arousable so evening meds held.  A:  15 min observation continues for safety  R: pt remains safe

## 2014-01-14 NOTE — BHH Suicide Risk Assessment (Signed)
   Nursing information obtained from:    Demographic factors:    Current Mental Status:    Loss Factors:    Historical Factors:    Risk Reduction Factors:    Total Time spent with patient: 20 minutes  CLINICAL FACTORS:   Severe Anxiety and/or Agitation Depression:   Aggression Anhedonia Comorbid alcohol abuse/dependence Hopelessness Impulsivity Insomnia Severe Alcohol/Substance Abuse/Dependencies Currently Psychotic Unstable or Poor Therapeutic Relationship  Psychiatric Specialty Exam: Physical Exam  Psychiatric: Her speech is normal. Her mood appears anxious. She is agitated, aggressive and actively hallucinating. Cognition and memory are normal. She expresses impulsivity. She exhibits a depressed mood. She expresses suicidal ideation.    Review of Systems  Constitutional: Negative.   HENT: Negative.   Eyes: Negative.   Respiratory: Negative.   Cardiovascular: Negative.   Gastrointestinal: Negative.   Genitourinary: Negative.   Musculoskeletal: Positive for myalgias.  Skin: Negative.   Neurological: Negative.   Endo/Heme/Allergies: Negative.   Psychiatric/Behavioral: Positive for depression, suicidal ideas, hallucinations and substance abuse. The patient is nervous/anxious and has insomnia.     Blood pressure 124/70, pulse 102, temperature 97.5 F (36.4 C), temperature source Oral, resp. rate 18, height 5' 2"  (1.575 m), weight 41.277 kg (91 lb), SpO2 100.00%.Body mass index is 16.64 kg/(m^2).  General Appearance: Disheveled  Eye Contact::  Minimal  Speech:  Slow  Volume:  Decreased  Mood:  Depressed and Irritable  Affect:  Constricted  Thought Process:  Disorganized  Orientation:  Full (Time, Place, and Person)  Thought Content:  Hallucinations: Auditory  Suicidal Thoughts:  Yes.  without intent/plan  Homicidal Thoughts:  No  Memory:  Immediate;   Fair Recent;   Fair Remote;   Fair  Judgement:  Poor  Insight:  Shallow  Psychomotor Activity:  Decreased   Concentration:  Fair  Recall:  [place]: Fair  Akathisia:  No  Handed:  Right  AIMS (if indicated):     Assets:  Communication Skills Desire for Improvement Physical Health  Sleep:  Number of Hours: 6.75   Musculoskeletal: Strength & Muscle Tone: within normal limits Gait & Station: normal Patient leans: N/A  COGNITIVE FEATURES THAT CONTRIBUTE TO RISK:  Closed-mindedness Polarized thinking    SUICIDE RISK:   Mild:  Suicidal ideation of limited frequency, intensity, duration, and specificity.  There are no identifiable plans, no associated intent, mild dysphoria and related symptoms, good self-control (both objective and subjective assessment), few other risk factors, and identifiable protective factors, including available and accessible social support.  PLAN OF CARE:1. Admit for crisis management and stabilization. 2. Medication management to reduce current symptoms to base line and improve the     patient's overall level of functioning 3. Treat health problems as indicated. 4. Develop treatment plan to decrease risk of relapse upon discharge and the need for     readmission. 5. Psycho-social education regarding relapse prevention and self care. 6. Health care follow up as needed for medical problems. 7. Restart home medications where appropriate.   I certify that inpatient services furnished can reasonably be expected to improve the patient's condition.  Corena Pilgrim, MD 01/14/2014, 9:35 AM

## 2014-01-14 NOTE — Progress Notes (Signed)
Patient did not attend the evening speaker Scott meeting. Pt was notified that group was beginning but remained in bed during the meeting.

## 2014-01-14 NOTE — Progress Notes (Signed)
D   Pt stayed in bed most of the day   She appears depressed and sad   She has poor hygeine and is maloderous    She agreed to put the medication on her ear after she washed or showered   She reports passive suicidal ideation but does contract for safety A   Verbal support given   Medications administered and effectiveness monitored  Q 15 min checks R   Pt safe at present

## 2014-01-14 NOTE — BHH Group Notes (Signed)
[place] LCSW Group Therapy No    01/14/2014 10:00 AM  Type of Therapy and Topic:  Group Therapy: Stages of Change and Self-Sabotaging, Enabling Behaviors   Participation Level:  Did Not Attend     Sheilah Pigeon, LCSW

## 2014-01-14 NOTE — B[facility] Group Notes (Signed)
[facility] Group Notes:  (Nursing/MHT/Case Management/Adjunct)  Date:  01/14/2014  Time: 0900 am  Type of Therapy: Self Inventory  Participation Level:  Did Not Attend  Arman Bogus [facility] 01/14/2014, 10:29 AM

## 2014-01-14 NOTE — Progress Notes (Signed)
Adult Psychoeducational Group Note  Date:  01/14/2014 Time:  4:05 PM  Group Topic/Focus:  Making Healthy Choices:   The focus of this group is to help patients identify negative/unhealthy choices they were using prior to admission and identify positive/healthier coping strategies to replace them upon discharge.  Participation Level:  Did Not Attend  [name] 01/14/2014, 4:05 PM

## 2014-01-15 DIAGNOSIS — F1910 Other psychoactive substance abuse, uncomplicated: Secondary | ICD-10-CM

## 2014-01-15 MED ORDER — ADULT MULTIVITAMIN W/MINERALS CH
1.0000 | ORAL_TABLET | Freq: Every day | ORAL | Status: DC
  Administered 2014-01-15 – 2014-01-26 (×12): 1 via ORAL
  Filled 2014-01-15 (×13): qty 1

## 2014-01-15 MED ORDER — ENSURE COMPLETE PO LIQD: 237.0000 mL | Freq: Two times a day (BID) | ORAL | Status: DC

## 2014-01-15 MED ORDER — ENSURE COMPLETE PO LIQD
237.0000 mL | Freq: Three times a day (TID) | ORAL | Status: DC
  Administered 2014-01-15 – 2014-01-16 (×3): 237 mL via ORAL

## 2014-01-15 NOTE — BHH Counselor (Signed)
Adult Psychosocial Assessment Update Interdisciplinary Team  Previous [facility] admissions/discharges:  Admissions Discharges  Date: 10/17/13 Date: 10/23/13  Date: 08/30/13 Date: 09/05/13  Date: 06/23/13 Date: 06/28/13  Date: 05/14/13 Date: 05/17/13  Date: Date:   Changes since the last Psychosocial Assessment (including adherence to outpatient mental health and/or substance abuse treatment, situational issues contributing to decompensation and/or relapse). [name] is a 53 year old female who presented voluntarily to [facility] expressing intent to shoot self with a gun. Patient has been noncompliant with psychiatric medications. 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 Records in epic indicate this condition is from chronic cocaine use. Patient states today during her psychiatric assessment "I was feeling suicidal. I hurt in my body. I feel depressed. I think the cocaine made it worse. I tried to go get my medications but they would not refill them and I got mad." [name] was irritable during her assessment and appeared to have some trouble processing information. She reports not having gone to any follow up appointments since January. Patient became upset when this was suggested as the cause of her not being able to refill prescriptions. She does admit to hearing voices recently. So far patient has been isolating to her room and is not attending groups. Her hygiene is poor and she is noted to be maloderous.              Discharge Plan 1. Will you be returning to the same living situation after discharge?   Yes: No:      If no, what is your plan?    Pt reports that she is homeless in [place]. Pt not attending d/c planning and minimally interested in discussing aftercare plan at this time.        2. Would you like a referral for services when you are discharged? Yes:     If yes, for what services?  No:       Pt reports that she is homeless in  [place]. Pt not attending d/c planning and minimally interested in discussing aftercare plan at this time. CSW assessing for appropriate referrals.        Summary and Recommendations (to be completed by the evaluator) Pt is 53 year old female who reports that she is homeless in [facility]. Pt presents to [facility] due to SI with plan, AH, depression, and cocaine abuse. Recommendations for pt include: crisis stabilization, therapeutic milieu, encourage group attendance and participation, medication management for mood stabilization, and development of comprehensive mental wellness/sobriety plan. Pt reports that during last admission, she was set up with [facility] for med management/services but failed to follow through with this and has not been compliant with meds. Pt denies SI/HI/AVH at this time but continues to present with depressed mood/lethargic affect, with poor hygiene. CSW assessing for appropriate referrals-pt minimally interested in discussing aftercare at this time.                        Signature:  [place], [place]  01/15/2014 12:46 PM

## 2014-01-15 NOTE — Progress Notes (Signed)
[facility] MD Progress Note  01/15/2014 4:45 PM [name]  MRN:  [id number] Subjective:  [name] states that she does not know how things get to this point in which she goes back to using cocaine and his whole life goes down hill. She states she really wants help this time around. Admits to feeling very overwhelmed, very alone. States she has not been able to hear from her family, does not have kids, she feels very alone. Will like to hear from her mother. She states she is very depressed "cant go on."  Diagnosis:   DSM5: Schizophrenia Disorders:  none Obsessive-Compulsive Disorders:  none Trauma-Stressor Disorders:  none Substance/Addictive Disorders:  Cocaine use disorder Depressive Disorders:  Major Depressive Disorder - Severe (296.23) Total Time spent with patient: 30 minutes  Axis I: Substance Induced Mood Disorder  ADL's:  Intact  Sleep: better last night  Appetite:  poor  Suicidal Ideation:  Plan:  denies Intent:  denies Means:  denies Homicidal Ideation:  Plan:  denies Intent:  denies Means:  denies AEB (as evidenced by):  Psychiatric Specialty Exam: Physical Exam  Review of Systems  Constitutional: Positive for malaise/fatigue.  HENT: Negative.   Eyes: Negative.   Respiratory: Negative.   Cardiovascular: Negative.   Gastrointestinal: Negative.   Genitourinary: Negative.   Musculoskeletal: Negative.   Skin:       Skin ulcerations in ears  Neurological: Positive for weakness.  Endo/Heme/Allergies: Negative.   Psychiatric/Behavioral: Positive for depression and substance abuse. The patient is nervous/anxious and has insomnia.     Blood pressure 87/58, pulse 87, temperature 97.8 F (36.6 C), temperature source Oral, resp. rate 16, height 5' 2"  (1.575 m), weight 41.277 kg (91 lb), SpO2 100.00%.Body mass index is 16.64 kg/(m^2).  General Appearance: Disheveled  Eye Sport and exercise psychologist::  Fair  Speech:  Clear and Coherent  Volume:  Normal  Mood:  Anxious and worried,  shame, guilt  Affect:  Restricted becomes terful  Thought Process:  Coherent and Goal Directed  Orientation:  Full (Time, Place, and Person)  Thought Content:  symtpoms, worries, concerns  Suicidal Thoughts:  intermiitent  Homicidal Thoughts:  No  Memory:  Immediate;   Fair Recent;   Fair Remote;   Fair  Judgement:  Fair  Insight:  Present and Shallow  Psychomotor Activity:  Restlessness  Concentration:  Fair  Recall:  [employer] of Knowledge:NA  Language: Fair  Akathisia:  No  Handed:    AIMS (if indicated):     Assets:  Desire for Improvement  Sleep:  Number of Hours: 6.75   Musculoskeletal: Strength & Muscle Tone: within normal limits Gait & Station: normal Patient leans: N/A  Current Medications: Current Facility-Administered Medications  Medication Dose Route Frequency Provider Last Rate Last Dose  . alum & mag hydroxide-simeth (MAALOX/MYLANTA) 200-200-20 MG/5ML suspension 30 mL  30 mL Oral Q4H PRN Waylan Boga, NP      . bacitracin ointment 1 application  1 application Topical BID Waylan Boga, NP   1 application at 21/22/48 0845  . buPROPion [facility]) tablet 100 mg  100 mg Oral QAC breakfast Waylan Boga, NP   100 mg at 01/15/14 0845  . feeding supplement (ENSURE COMPLETE) (ENSURE COMPLETE) liquid 237 mL  237 mL Oral TID BM Darrol Jump, RD      . hydrOXYzine (ATARAX/VISTARIL) tablet 25 mg  25 mg Oral Q6H PRN Waylan Boga, NP      . magnesium hydroxide (MILK OF MAGNESIA) suspension 30 mL  30 mL  Oral Daily PRN Waylan Boga, NP      . mirtazapine (REMERON) tablet 7.5 mg  7.5 mg Oral QHS Waylan Boga, NP   7.5 mg at 01/14/14 2304  . multivitamin with minerals tablet 1 tablet  1 tablet Oral Daily Darrol Jump, RD      . risperiDONE (RISPERDAL) tablet 0.25 mg  0.25 mg Oral BID Waylan Boga, NP   0.25 mg at 01/15/14 0845  . traMADol (ULTRAM) tablet 50 mg  50 mg Oral Q6H PRN Waylan Boga, NP   50 mg at 01/15/14 0845  . traZODone (DESYREL) tablet 50 mg  50 mg Oral QHS  Waylan Boga, NP   50 mg at 01/14/14 2306    Lab Results: No results found for this or any previous visit (from the past 48 hour(s)).  Physical Findings: AIMS: Facial and Oral Movements Muscles of Facial Expression: None, normal Lips and Perioral Area: None, normal Jaw: None, normal Tongue: None, normal,Extremity Movements Upper (arms, wrists, hands, fingers): None, normal Lower (legs, knees, ankles, toes): None, normal, Trunk Movements Neck, shoulders, hips: None, normal, Overall Severity Severity of abnormal movements (highest score from questions above): None, normal Incapacitation due to abnormal movements: None, normal Patient's awareness of abnormal movements (rate only patient's report): No Awareness, Dental Status Current problems with teeth and/or dentures?: No Does patient usually wear dentures?: No  CIWA:  CIWA-Ar Total: 0 COWS:  COWS Total Score: 0  Treatment Plan Summary: Daily contact with patient to assess and evaluate symptoms and progress in treatment Medication management  Plan: Supportive approach/coping skills/relapse prevention           Reassess and address the co morbidities           Work to stabilize her mood   Medical Decision Making Problem Points:  Review of psycho-social stressors (1) Data Points:  Review of medication regiment & side effects (2) Review of new medications or change in dosage (2)  I certify that inpatient services furnished can reasonably be expected to improve the patient's condition.   Nicholaus Bloom 01/15/2014, 4:45 PM

## 2014-01-15 NOTE — Progress Notes (Signed)
NUTRITION ASSESSMENT  Pt identified as at risk on the Malnutrition Screen Tool  INTERVENTION: 1. Educated patient on the importance of nutrition and encouraged intake of food and beverages. 2. Discussed weight goals. 3. Supplements: Ensure Complete po TID, each supplement provides 350 kcal and 13 grams of protein   NUTRITION DIAGNOSIS: Unintentional weight loss related to sub-optimal intake as evidenced by pt report.   Goal: Pt to meet >/= 90% of their estimated nutrition needs.  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  Currently intake and appetite are good.  Prior to admit, patient states that she did not eat regularly and some days would not eat at all secondary to poor appetite and drugs.  Noted that Remeron has been started.  Patient meets criteria for severe malnutrition related to social and environmental causes AEB intake < 50% for >/=1 month, and depletion of body fat and muscle mass.  53 y.o. female  Height: Ht Readings from Last 1 Encounters:  01/13/14 5' 2"  (1.575 m)    Weight: Wt Readings from Last 1 Encounters:  01/13/14 91 lb (41.277 kg)    Weight Hx: Wt Readings from Last 10 Encounters:  01/13/14 91 lb (41.277 kg)  12/14/13 100 lb (45.36 kg)  10/17/13 100 lb (45.36 kg)  10/14/13 97 lb 11.2 oz (44.316 kg)  09/27/13 101 lb 6.6 oz (46 kg)  09/27/13 101 lb 6.6 oz (46 kg)  08/31/13 95 lb (43.092 kg)  06/23/13 94 lb (42.638 kg)  06/19/13 92 lb 8 oz (41.958 kg)  06/17/13 102 lb (46.267 kg)    BMI:  Body mass index is 16.64 kg/(m^2). Pt meets criteria for normal weight based on current BMI.  Estimated Nutritional Needs: Kcal: 25-30 kcal/kg Protein: > 1 gram protein/kg Fluid: 1 ml/kcal  Diet Order: General Pt is also offered choice of unit snacks mid-morning and mid-afternoon.  Pt is eating as desired.   Lab results and medications reviewed.   Antonieta Iba, RD,  LDN Clinical Inpatient Dietitian Pager:  [phone] Weekend and after hours pager:  [phone]

## 2014-01-15 NOTE — Tx Team (Signed)
Interdisciplinary Treatment Plan Update (Adult)  Date: 01/15/2014   Time Reviewed: 11:36 AM  Progress in Treatment:  Attending groups: No.  Participating in groups:  No.  Taking medication as prescribed: Yes  Tolerating medication: Yes  Family/Significant othe contact made: Not yet. SPE required for this pt.  Patient understands diagnosis: Yes, AEB seeking treatment for cocaine abuse, AH, Depression, and SI with a plan.  Discussing patient identified problems/goals with staff: Yes  Medical problems stabilized or resolved: Yes  Denies suicidal/homicidal ideation: Passive Si reported.  Patient has not harmed self or Others: Yes  New problem(s) identified:  Discharge Plan or Barriers: Pt not attending aftercare planning group. CSW assessing for appropriate referrals.  Additional comments: [name] is a 53 year old female who presented voluntarily to [facility] expressing intent to shoot self with a gun. Patient has been noncompliant with psychiatric medications. 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 Records in epic indicate this condition is from chronic cocaine use. Patient states today during her psychiatric assessment "I was feeling suicidal. I hurt in my body. I feel depressed. I think the cocaine made it worse. I tried to go get my medications but they would not refill them and I got mad." [name] was irritable during her assessment and appeared to have some trouble processing information. She reports not having gone to any follow up appointments since January. Patient became upset when this was suggested as the cause of her not being able to refill prescriptions. She does admit to hearing voices recently. So far patient has been isolating to her room and is not attending groups. Her hygiene is poor and she is noted to be maloderous.  Reason for Continuation of Hospitalization: Mood stabilizaton Med management SI Estimated length of stay: 3-5 days  For review of  initial/current patient goals, please see plan of care.  Attendees:  Patient:    Family:    Physician: Carlton Adam MD 01/15/2014 11:36 AM   Nursing: [place] 01/15/2014 11:36 AM   Clinical Social Worker [place], [place]  01/15/2014 11:36 AM   Other: Chrys Racer RN 01/15/2014 11:36 AM   Other: Seth Bake RN 01/15/2014 11:36 AM   Other: Hardie Pulley. PA 01/15/2014 11:36 AM   Other:  Gerline Legacy Nurse CM 01/15/2014 11:36 AM   Scribe for Treatment Team:  Nira Conn Smart LCSWA 01/15/2014 11:36 AM

## 2014-01-15 NOTE — Progress Notes (Signed)
Adult Psychoeducational Group Note  Date:  01/15/2014 Time:  10:00 am  Group Topic/Focus:  Wellness Toolbox:   The focus of this group is to discuss various aspects of wellness, balancing those aspects and exploring ways to increase the ability to experience wellness.  Patients will create a wellness toolbox for use upon discharge.  Participation Level:  Did not attend.   Zhana Jeangilles 01/15/2014, 6:40 PM

## 2014-01-15 NOTE — Progress Notes (Signed)
D: Pt denies SI/HI/AV. Pt is pleasant and cooperative. Pt stayed in bed most of the night, but came out for snack later. Pt has necrotic ears that are painful.  A: Pt was offered support and encouragement. Pt was given scheduled medications. Pt was encourage to attend groups. Q 15 minute checks were done for safety. Pt given PRN Ultram when available.   R:Pt is taking medication.Pt receptive to treatment and safety maintained on unit.

## 2014-01-15 NOTE — Progress Notes (Signed)
[name], female   DOB: Mar 02, 1961, 53 y.o.   MRN: [id number]  D: Pt. Denies SI/HI and A/V Hallucinations. Patient did not fill out daily inventory sheet today. Patient was experiencing pain on ears and received PRN pain medication for this.   A: Support and encouragement provided to the patient. Scheduled medications given to patient per physician's orders.  R: Patient is receptive and cooperative but isolative. Patient is rarely seen in the milieu, often sleeping in room. Q15 minute checks are maintained for safety.

## 2014-01-15 NOTE — BHH Group Notes (Signed)
[facility] LCSW Aftercare Discharge Planning Group Note   01/15/2014 11:35 AM  Participation Quality:  DID NOT ATTEND-pt sleeping in room during group.   [place]

## 2014-01-15 NOTE — BHH Group Notes (Signed)
[name] LCSW Group Therapy  01/15/2014 4:23 PM  Type of Therapy:  Group Therapy  Participation Level:  Did Not Attend-pt sleeping during today's therapy group.   [name] Smart LCSWA  01/15/2014, 4:23 PM

## 2014-01-16 NOTE — Progress Notes (Signed)
[name], female   DOB: 31-Mar-1961, 53 y.o.   MRN: [id number]  D: Pt stated she's living with friends in an apt. Stated, "the people here are gonna help me get a place".  Writer asked pt if she followed up after her last discharge. Pt stated, "I went straight back to the streets. This time it's suppose to be [facility]".  Writer spoke to pt about her ears and bandaged them for her to lay down.   A:  Support and encouragement was offered. 15 min checks continued for safety.  R: Pt remains safe.

## 2014-01-16 NOTE — BHH Suicide Risk Assessment (Signed)
[facility] Adult Inpatient Family/Significant Other Suicide Prevention Education  Suicide Prevention Education:   Patient Refusal for Family/Significant Other Suicide Prevention Education: The patient has refused to provide written consent for family/significant other to be provided Family/Significant Other Suicide Prevention Education during admission and/or prior to discharge.  Physician notified.  CSW provided suicide prevention information with patient.    The suicide prevention education provided includes the following:  Suicide risk factors  Suicide prevention and interventions  National Suicide Hotline telephone number  [facility] assessment telephone number  [facility] Emergency Assistance [place] and/or Residential Mobile Crisis Unit telephone number   [name], [place] 01/16/2014 8:26 AM

## 2014-01-16 NOTE — Progress Notes (Signed)
Recreation Therapy Notes  Animal-Assisted Activity/Therapy (AAA/T) Program Checklist/Progress Notes Patient Eligibility Criteria Checklist & Daily Group note for Rec Tx Intervention  Date: 04.14.2015 Time: 2:45am Location: [address]    AAA/T Program Assumption of Risk Form signed by Patient/ or Parent Legal Guardian yes  Patient is free of allergies or sever asthma yes  Patient reports no fear of animals yes  Patient reports no history of cruelty to animals yes   Patient understands his/her participation is voluntary yes  Behavioral Response: Did not attend.   Laureen Ochs [place] Hohn, LRT/CTRS  [place] Subramaniam L Hussein Macdougal 01/16/2014 5:11 PM

## 2014-01-16 NOTE — Progress Notes (Signed)
[name], female   DOB: [birth year], 53 y.o.   MRN: [id number] [facility] MD Progress Note  01/16/2014 2:32 PM [name]  MRN:  [id number]  Subjective:  [name] is lying in bed. She says "I'm in pain. My ears hurt. I feel very depressed and hopeless. I feel hopeless, like my life is going no where".  O: [name] was lying in her bed during this follow-up assessment. She is barely making eye contact. She presents with flat affect. He ear has some dressing on it. She denies SIHI.  Diagnosis:   DSM5: Schizophrenia Disorders:  none Obsessive-Compulsive Disorders:  none Trauma-Stressor Disorders:  none Substance/Addictive Disorders:  Cocaine use disorder Depressive Disorders:  Major Depressive Disorder - Severe (296.23) Total Time spent with patient: 30 minutes  Axis I: Substance Induced Mood Disorder  ADL's:  Intact  Sleep: better last night  Appetite:  poor  Suicidal Ideation:  Plan:  denies Intent:  denies Means:  denies Homicidal Ideation:  Plan:  denies Intent:  denies Means:  denies AEB (as evidenced by):  Psychiatric Specialty Exam: Physical Exam  Review of Systems  Constitutional: Positive for malaise/fatigue.  HENT: Negative.   Eyes: Negative.   Respiratory: Negative.   Cardiovascular: Negative.   Gastrointestinal: Negative.   Genitourinary: Negative.   Musculoskeletal: Negative.   Skin:       Skin ulcerations in ears  Neurological: Positive for weakness.  Endo/Heme/Allergies: Negative.   Psychiatric/Behavioral: Positive for depression and substance abuse. The patient is nervous/anxious and has insomnia.     Blood pressure 112/72, pulse 84, temperature 97.5 F (36.4 C), temperature source Oral, resp. rate 20, height 5' 2"  (1.575 m), weight 41.277 kg (91 lb), SpO2 100.00%.Body mass index is 16.64 kg/(m^2).  General Appearance: Disheveled  Eye Sport and exercise psychologist::  Fair  Speech:  Clear and Coherent  Volume:  Normal  Mood:  Anxious and worried, shame,  guilt  Affect:  Restricted becomes terful  Thought Process:  Coherent and Goal Directed  Orientation:  Full (Time, Place, and Person)  Thought Content:  symtpoms, worries, concerns  Suicidal Thoughts:  intermiitent  Homicidal Thoughts:  No  Memory:  Immediate;   Fair Recent;   Fair Remote;   Fair  Judgement:  Fair  Insight:  Present and Shallow  Psychomotor Activity:  Restlessness  Concentration:  Fair  Recall:  [employer] of Knowledge:NA  Language: Fair  Akathisia:  No  Handed:    AIMS (if indicated):     Assets:  Desire for Improvement  Sleep:  Number of Hours: 6.75   Musculoskeletal: Strength & Muscle Tone: within normal limits Gait & Station: normal Patient leans: N/A  Current Medications: Current Facility-Administered Medications  Medication Dose Route Frequency Provider Last Rate Last Dose  . alum & mag hydroxide-simeth (MAALOX/MYLANTA) 200-200-20 MG/5ML suspension 30 mL  30 mL Oral Q4H PRN Waylan Boga, NP      . bacitracin ointment 1 application  1 application Topical BID Waylan Boga, NP   1 application at 40/08/67 [phone]  . buPROPion [facility]) tablet 100 mg  100 mg Oral QAC breakfast Waylan Boga, NP   100 mg at 01/16/14 0645  . feeding supplement (ENSURE COMPLETE) (ENSURE COMPLETE) liquid 237 mL  237 mL Oral TID BM Darrol Jump, RD   237 mL at 01/16/14 0829  . hydrOXYzine (ATARAX/VISTARIL) tablet 25 mg  25 mg Oral Q6H PRN Waylan Boga, NP      . magnesium hydroxide (MILK OF MAGNESIA) suspension 30 mL  30 mL Oral Daily PRN Waylan Boga, NP      . mirtazapine (REMERON) tablet 7.5 mg  7.5 mg Oral QHS Waylan Boga, NP   7.5 mg at 01/15/14 2127  . multivitamin with minerals tablet 1 tablet  1 tablet Oral Daily Darrol Jump, RD   1 tablet at 01/16/14 9024  . risperiDONE (RISPERDAL) tablet 0.25 mg  0.25 mg Oral BID Waylan Boga, NP   0.25 mg at 01/16/14 0973  . traMADol (ULTRAM) tablet 50 mg  50 mg Oral Q6H PRN Waylan Boga, NP   50 mg at 01/16/14 5329  . traZODone  (DESYREL) tablet 50 mg  50 mg Oral QHS Waylan Boga, NP   50 mg at 01/15/14 2127    Lab Results: No results found for this or any previous visit (from the past 48 hour(s)).  Physical Findings: AIMS: Facial and Oral Movements Muscles of Facial Expression: None, normal Lips and Perioral Area: None, normal Jaw: None, normal Tongue: None, normal,Extremity Movements Upper (arms, wrists, hands, fingers): None, normal Lower (legs, knees, ankles, toes): None, normal, Trunk Movements Neck, shoulders, hips: None, normal, Overall Severity Severity of abnormal movements (highest score from questions above): None, normal Incapacitation due to abnormal movements: None, normal Patient's awareness of abnormal movements (rate only patient's report): No Awareness, Dental Status Current problems with teeth and/or dentures?: No Does patient usually wear dentures?: No  CIWA:  CIWA-Ar Total: 0 COWS:  COWS Total Score: 0  Treatment Plan Summary: Daily contact with patient to assess and evaluate symptoms and progress in treatment Medication management  Plan: Supportive approach/coping skills/relapse prevention Reassess and address the co morbidities. Work to stabilize her mood Continue current plan of care  Medical Decision Making Problem Points:  Review of psycho-social stressors (1) Data Points:  Review of medication regiment & side effects (2) Review of new medications or change in dosage (2)  I certify that inpatient services furnished can reasonably be expected to improve the patient's condition.   Encarnacion Slates, PMHNP-BC 01/16/2014, 2:32 PM

## 2014-01-16 NOTE — Progress Notes (Signed)
D:Pt has been in bed much of the day with c/o ear pain. Pt is mildly anxious and interacting appropriately with staff and peers.  A:Offered support, encouragement and 15 minute checks Gave prn medication as ordered.  R:Pt denies si and hi. Safety maintained on the unit.

## 2014-01-16 NOTE — BHH Group Notes (Signed)
[facility] LCSW Group Therapy  01/16/2014 1:15 PM   Type of Therapy:  Group Therapy  Participation Level:  Did Not Attend - pt resting in her room  [name], LCSW 01/16/2014 1:56 PM

## 2014-01-16 NOTE — BHH Group Notes (Signed)
[facility] Group Notes:  (Nursing/MHT/Case Management/Adjunct)  Date:  01/16/2014  Time: 0900 am  Type of Therapy:  Psychoeducational Skills  Participation Level:  Did Not Attend   Zipporah Plants 01/16/2014, 9:49 AM

## 2014-01-16 NOTE — BHH Group Notes (Signed)
Adult Psychoeducational Group Note  Date:  01/16/2014 Time:  10:37 PM  Group Topic/Focus:  AA Meeting  Participation Level:  None  Participation Quality:  Attentive  Affect:  Appropriate  Cognitive:  Alert  Insight: None  Engagement in Group:  None  Modes of Intervention:  Discussion and Education  Additional Comments:  [name] attended [name].  [name] A Ria Comment 01/16/2014, 10:37 PM

## 2014-01-17 DIAGNOSIS — F1994 Other psychoactive substance use, unspecified with psychoactive substance-induced mood disorder: Secondary | ICD-10-CM

## 2014-01-17 NOTE — Progress Notes (Signed)
Attended group

## 2014-01-17 NOTE — Progress Notes (Signed)
D: Pt denies SI/HI/AVH. Pt is pleasant and cooperative. Pt up on the unit and participating in unit activities. Pt complained about pain in ears.   A: Pt was offered support and encouragement. Pt was given scheduled medications. Pt was encourage to attend groups. Q 15 minute checks were done for safety. Pt ears covered and bacitracin applied.   R:Pt attends groups and interacts well with peers and staff. Pt is taking medication. Pt has no complaints.Pt receptive to treatment and safety maintained on unit. Pt stated her ears felt better.

## 2014-01-17 NOTE — Tx Team (Signed)
Interdisciplinary Treatment Plan Update (Adult)  Date: 01/17/2014  Time Reviewed:  9:45 AM  Progress in Treatment: Attending groups: Yes Participating in groups:  Yes Taking medication as prescribed:  Yes Tolerating medication:  Yes Family/Significant othe contact made: No, pt refused Patient understands diagnosis:  Yes Discussing patient identified problems/goals with staff:  Yes Medical problems stabilized or resolved:  Yes Denies suicidal/homicidal ideation: Yes Issues/concerns per patient self-inventory:  Yes Other:  New problem(s) identified: N/A  Discharge Plan or Barriers: CSW is working on referring pt to [facility] and [facility] for further inpatient treatment and has follow up with [facility] for outpatient treatment after inpatient.    Reason for Continuation of Hospitalization: Anxiety Depression Medication Stabilization Detox  Comments: N/A  Estimated length of stay: 3-5 days  For review of initial/current patient goals, please see plan of care.  Attendees: Patient:     Family:     Physician:  Dr. Sabra Heck 01/17/2014 10:19 AM   Nursing:   Gaylan Gerold, RN 01/17/2014 10:19 AM   Clinical Social Worker:  [name], LCSW 01/17/2014 10:19 AM   Other: Lars Pinks, RN case manager 01/17/2014 10:19 AM   Other:  Maxie Better, [place] 01/17/2014 10:19 AM   Other:  Agustina Caroli, NP 01/17/2014 10:19 AM   Other:  Para March, RN 01/17/2014 10:19 AM   Other: Norberto Sorenson, care coordinator 01/17/2014 10:20 AM   Other: Jiles Garter, pharmacist 01/17/2014 10:20 AM   Other:    Other:    Other:    Other:     Scribe for Treatment Team:   Ane Payment, 01/17/2014 , 10:19 AM

## 2014-01-17 NOTE — BHH Group Notes (Signed)
[facility] LCSW Aftercare Discharge Planning Group Note   01/17/2014  8:45 AM  Participation Quality:  Did Not Attend - pt sleeping in her room  [name], LCSW 01/17/2014 9:36 AM

## 2014-01-17 NOTE — Progress Notes (Signed)
[facility] MD Progress Note  01/17/2014 4:50 PM [name]  MRN:  [id number] Subjective:  [name] hopes to be able to get into [facility]. Afterwards she would like to be able to get a safe residence away from people using. She is isolating. States she does not like to be around a lot of people. States she tends to avoid interacting as she gets very anxious, upset. As she gets more anxious the skin lesions in her ears become more painful, skin gets more irritated.  Diagnosis:   DSM5: Schizophrenia Disorders:  none Obsessive-Compulsive Disorders:  none Trauma-Stressor Disorders:  none Substance/Addictive Disorders:  Cocaine related Disorder Depressive Disorders:  Major Depressive Disorder - Severe (296.23) Total Time spent with patient: 20 minutes  Axis I: Substance Induced Mood Disorder  ADL's:  Intact  Sleep: Fair  Appetite:  Fair  Suicidal Ideation:  Plan:  denies Intent:  denies Means:  denies Homicidal Ideation:  Plan:  denies Intent:  denies Means:  denies AEB (as evidenced by):  Psychiatric Specialty Exam: Physical Exam  Review of Systems  Constitutional: Positive for malaise/fatigue.  HENT: Positive for ear pain.   Eyes: Negative.   Respiratory: Negative.   Cardiovascular: Negative.   Gastrointestinal: Negative.   Genitourinary: Negative.   Musculoskeletal: Positive for myalgias.  Skin: Negative.   Neurological: Positive for weakness.  Endo/Heme/Allergies: Negative.   Psychiatric/Behavioral: Positive for depression and substance abuse. The patient is nervous/anxious.     Blood pressure 158/82, pulse 87, temperature 98.3 F (36.8 C), temperature source Oral, resp. rate 17, height 5' 2"  (1.575 m), weight 41.277 kg (91 lb), SpO2 100.00%.Body mass index is 16.64 kg/(m^2).  General Appearance: Disheveled  Eye Sport and exercise psychologist::  Fair  Speech:  Clear and Coherent  Volume:  Decreased  Mood:  Angry and Depressed  Affect:  anxious, worried  Thought Process:  Coherent and Goal  Directed  Orientation:  Full (Time, Place, and Person)  Thought Content:  symptoms, worries concerns  Suicidal Thoughts:  No  Homicidal Thoughts:  No  Memory:  Immediate;   Fair Recent;   Fair Remote;   Fair  Judgement:  Fair  Insight:  Shallow  Psychomotor Activity:  Restlessness  Concentration:  Fair  Recall:  [employer] of Knowledge:NA  Language: Fair  Akathisia:  No  Handed:    AIMS (if indicated):     Assets:  Desire for Improvement  Sleep:  Number of Hours: 6.75   Musculoskeletal: Strength & Muscle Tone: within normal limits Gait & Station: normal Patient leans: N/A  Current Medications: Current Facility-Administered Medications  Medication Dose Route Frequency Provider Last Rate Last Dose  . alum & mag hydroxide-simeth (MAALOX/MYLANTA) 200-200-20 MG/5ML suspension 30 mL  30 mL Oral Q4H PRN Waylan Boga, NP      . bacitracin ointment 1 application  1 application Topical BID Waylan Boga, NP   1 application at 20/25/42 [phone]  . buPROPion [facility]) tablet 100 mg  100 mg Oral QAC breakfast Waylan Boga, NP   100 mg at 01/17/14 3762  . feeding supplement (ENSURE COMPLETE) (ENSURE COMPLETE) liquid 237 mL  237 mL Oral TID BM Darrol Jump, RD   237 mL at 01/16/14 1932  . hydrOXYzine (ATARAX/VISTARIL) tablet 25 mg  25 mg Oral Q6H PRN Waylan Boga, NP      . magnesium hydroxide (MILK OF MAGNESIA) suspension 30 mL  30 mL Oral Daily PRN Waylan Boga, NP      . mirtazapine (REMERON) tablet 7.5 mg  7.5 mg  Oral QHS Waylan Boga, NP   7.5 mg at 01/16/14 2140  . multivitamin with minerals tablet 1 tablet  1 tablet Oral Daily Darrol Jump, RD   1 tablet at 01/17/14 [phone]  . risperiDONE (RISPERDAL) tablet 0.25 mg  0.25 mg Oral BID Waylan Boga, NP   0.25 mg at 01/17/14 0109  . traMADol (ULTRAM) tablet 50 mg  50 mg Oral Q6H PRN Waylan Boga, NP   50 mg at 01/17/14 1500  . traZODone (DESYREL) tablet 50 mg  50 mg Oral QHS Waylan Boga, NP   50 mg at 01/16/14 2140    Lab Results: No  results found for this or any previous visit (from the past 48 hour(s)).  Physical Findings: AIMS: Facial and Oral Movements Muscles of Facial Expression: None, normal Lips and Perioral Area: None, normal Jaw: None, normal Tongue: None, normal,Extremity Movements Upper (arms, wrists, hands, fingers): None, normal Lower (legs, knees, ankles, toes): None, normal, Trunk Movements Neck, shoulders, hips: None, normal, Overall Severity Severity of abnormal movements (highest score from questions above): None, normal Incapacitation due to abnormal movements: None, normal Patient's awareness of abnormal movements (rate only patient's report): No Awareness, Dental Status Current problems with teeth and/or dentures?: No Does patient usually wear dentures?: No  CIWA:  CIWA-Ar Total: 0 COWS:  COWS Total Score: 0  Treatment Plan Summary: Daily contact with patient to assess and evaluate symptoms and progress in treatment Medication management  Plan: Supportive approach/coping skills/relapse prevention           CBT: midfulness, breathing exercises to deal with the anxiety           Continue medications           High risk of relapse if discharge  Medical Decision Making Problem Points:  Review of psycho-social stressors (1) Data Points:  Review of medication regiment & side effects (2)  I certify that inpatient services furnished can reasonably be expected to improve the patient's condition.   Nicholaus Bloom 01/17/2014, 4:50 PM

## 2014-01-17 NOTE — Progress Notes (Signed)
Pt reported her sleep as well. Her appetite is improving energy low and ability to pay attention as poor.  Depression 5 hopelessness 1 and denies any anxiety on her self-inventory.  Denies any S/H ideations or A/V hallucinations.  Pt does admit she sometimes hears voices and sees shadows but denied this morning.

## 2014-01-17 NOTE — BHH Group Notes (Signed)
[place] LCSW Group Therapy  01/17/2014 1:15 PM   Type of Therapy:  Group Therapy  Participation Level:  Did Not Attend - pt sleeping in her room  [name], [place] 01/17/2014 2:44 PM

## 2014-01-17 NOTE — Clinical Social Work Note (Signed)
CSW made referral to [facility].  Pt was denied at [facility] due to pt only using cocaine and feeling like mental health is primary for her.  CSW also made a referral to [facility] and is awaiting an answer from [facility].    [name], LCSW 01/17/2014  3:19 PM

## 2014-01-18 MED ORDER — PREDNISONE 10 MG PO TABS
30.0000 mg | ORAL_TABLET | Freq: Every day | ORAL | Status: AC
  Administered 2014-01-21: 30 mg via ORAL
  Filled 2014-01-18: qty 3

## 2014-01-18 MED ORDER — PREDNISONE 20 MG PO TABS
40.0000 mg | ORAL_TABLET | Freq: Every day | ORAL | Status: AC
  Administered 2014-01-20: 40 mg via ORAL
  Filled 2014-01-18: qty 2

## 2014-01-18 MED ORDER — PREDNISONE 10 MG PO TABS
50.0000 mg | ORAL_TABLET | Freq: Every day | ORAL | Status: AC
  Administered 2014-01-19: 50 mg via ORAL

## 2014-01-18 MED ORDER — PREDNISONE 10 MG PO TABS
10.0000 mg | ORAL_TABLET | Freq: Every day | ORAL | Status: AC
  Administered 2014-01-23: 10 mg via ORAL
  Filled 2014-01-18: qty 1

## 2014-01-18 MED ORDER — PREDNISONE 20 MG PO TABS
20.0000 mg | ORAL_TABLET | Freq: Every day | ORAL | Status: AC
  Administered 2014-01-22: 20 mg via ORAL
  Filled 2014-01-18: qty 1

## 2014-01-18 MED ORDER — LIDOCAINE 5 % EX OINT
TOPICAL_OINTMENT | Freq: Four times a day (QID) | CUTANEOUS | Status: DC | PRN
  Administered 2014-01-18 (×2): via TOPICAL
  Filled 2014-01-18: qty 35.44

## 2014-01-18 MED ORDER — PREDNISONE 20 MG PO TABS
60.0000 mg | ORAL_TABLET | Freq: Every day | ORAL | Status: AC
  Administered 2014-01-18: 60 mg via ORAL
  Filled 2014-01-18 (×2): qty 3

## 2014-01-18 NOTE — Progress Notes (Signed)
D: Pt denies SI/HI/AVH. Pt is pleasant and cooperative. Pt upset she can't go to [facility] because they said she was only on Cocaine. Pt stated she was only here so she could go to [facility] and now that she is not going she wants to leave.   A: Pt was offered support and encouragement. Pt was given scheduled medications. Pt was encourage to attend groups. Q 15 minute checks were done for safety.   R:Pt attends groups and interacts well with peers and staff. Pt is taking medication.Pt receptive to treatment and safety maintained on unit.

## 2014-01-18 NOTE — Clinical Social Work Note (Signed)
Melissa from [facility] called and informed CSW that pt was denied at [facility] for treatment due to pt being on Tramadol, having a primary diagnosis of major depressive disorder and notes stating that she denies substance abuse.    [name], LCSW 01/18/2014  11:06 AM

## 2014-01-18 NOTE — BHH Group Notes (Signed)
[place] LCSW Group Therapy  01/18/2014 2:47 PM  Type of Therapy:  Group Therapy  Participation Level:  Did Not Attend-pt in room resting. Did not attend afternoon therapy group.   Iridian Reader Smart LCSWA  01/18/2014, 2:47 PM

## 2014-01-18 NOTE — Progress Notes (Signed)
[name], female   DOB: 24-May-1961, 53 y.o.   MRN: [id number]  D: Patient pleasant on approach today. Reports some stomach upset but thinks it was due to the ensure she drank last night. Gives depression "7" and feelings of hopelessness "6". Currently denies any SI at present. Started on prednisone taper for ear and has an order for lidocaine cream for numbness prn. Did receive 1 dose of prednisone 75m and given some lidocaine cream x 1.  A: Staff will monitor on q 15 minute checks, follow treatment plan, and give meds as ordered. R: Cooperative on the unit.

## 2014-01-18 NOTE — Progress Notes (Signed)
[name], female   DOB: March 25, 1961, 53 y.o.   MRN: [id number]  D: Pt informed the writer that mental health came thru and is gonna get her into [facility] or [facility]". Stated she was informed that "there is a 2 month wait for [facility], so she has a better chance of getting into [facility]".  A:  Support and encouragement was offered. 15 min checks continued for safety.  R: Pt remains safe.

## 2014-01-18 NOTE — Progress Notes (Signed)
Recreation Therapy Notes  Animal-Assisted Activity/Therapy (AAA/T) Program Checklist/Progress Notes Patient Eligibility Criteria Checklist & Daily Group note for Rec Tx Intervention  Date: 04.16.2015 Time: 2:45pm Location: [address]   AAA/T Program Assumption of Risk Form signed by Patient/ or Parent Legal Guardian yes  Patient is free of allergies or sever asthma yes  Patient reports no fear of animals yes  Patient reports no history of cruelty to animals yes   Patient understands his/her participation is voluntary yes  Behavioral Response: Did not attend.    Laureen Ochs Kamil Hanigan, LRT/CTRS  Laureen Ochs Narcisa Ganesh 01/18/2014 4:24 PM

## 2014-01-18 NOTE — BHH Group Notes (Signed)
[place] Group Notes:  (Nursing/MHT/Case Management/Adjunct)  Date:  01/18/2014  Time:  0930  Type of Therapy:  Psychoeducational Skills  Participation Level:  Did Not Attend  Participation Quality:    Affect:    Cognitive:    Insight:    Engagement in Group:    Modes of Intervention:    Summary of Progress/Problems: Morning Wellness  [place] 01/18/2014, 2:06 PM

## 2014-01-18 NOTE — Progress Notes (Signed)
[facility] MD Progress Note  01/18/2014 3:53 PM [name]  MRN:  [id number] Subjective:  [name] is having a hard time. She was not accepted at [facility] or [place]. She is dealing with the pain in the ulcerated lesions in her ears. She admits she gets very overwhelmed. There is a sense of hopelessness, helplessness.  Diagnosis:   DSM5: Schizophrenia Disorders:  denies Obsessive-Compulsive Disorders:  denies Trauma-Stressor Disorders:  denies Substance/Addictive Disorders:  Cocaine related disorder Depressive Disorders:  Major Depressive Disorder - Severe (296.23) Total Time spent with patient: 30 minutes  Axis I: Substance Induced Mood Disorder  ADL's:  Intact  Sleep: Poor  Appetite:  Fair  Suicidal Ideation:  Plan:  denies Intent:  denies Means:  denies Homicidal Ideation:  Plan:  denies Intent:  denies Means:  denies AEB (as evidenced by):  Psychiatric Specialty Exam: Physical Exam  Review of Systems  Constitutional: Positive for malaise/fatigue.  HENT:       Ulcerations in her ears  Eyes: Negative.   Respiratory: Negative.   Cardiovascular: Negative.   Gastrointestinal: Negative.   Genitourinary: Negative.   Musculoskeletal: Negative.   Skin: Negative.   Neurological: Positive for weakness.  Endo/Heme/Allergies: Negative.   Psychiatric/Behavioral: Positive for depression and substance abuse. The patient is nervous/anxious.     Blood pressure 158/82, pulse 87, temperature 98.3 F (36.8 C), temperature source Oral, resp. rate 17, height 5' 2"  (1.575 m), weight 41.277 kg (91 lb), SpO2 100.00%.Body mass index is 16.64 kg/(m^2).  General Appearance: Disheveled  Eye Sport and exercise psychologist::  Fair  Speech:  Clear and Coherent  Volume:  Decreased  Mood:  Anxious and Depressed  Affect:  Labile and Tearful  Thought Process:  Coherent and Goal Directed  Orientation:  Full (Time, Place, and Person)  Thought Content:  symptoms, worries, concerns  Suicidal Thoughts:  No  Homicidal  Thoughts:  No  Memory:  Immediate;   Fair Recent;   Fair Remote;   Fair  Judgement:  Fair  Insight:  Present and Shallow  Psychomotor Activity:  Restlessness  Concentration:  Fair  Recall:  [employer] of [place]: Fair  Akathisia:  No  Handed:    AIMS (if indicated):     Assets:  Desire for Improvement  Sleep:  Number of Hours: 5   Musculoskeletal: Strength & Muscle Tone: within normal limits Gait & Station: normal Patient leans: N/A  Current Medications: Current Facility-Administered Medications  Medication Dose Route Frequency Provider Last Rate Last Dose  . alum & mag hydroxide-simeth (MAALOX/MYLANTA) 200-200-20 MG/5ML suspension 30 mL  30 mL Oral Q4H PRN Waylan Boga, NP   30 mL at 01/18/14 1324  . bacitracin ointment 1 application  1 application Topical BID Waylan Boga, NP   1 application at 37/90/24 [phone]  . buPROPion [facility]) tablet 100 mg  100 mg Oral QAC breakfast Waylan Boga, NP   100 mg at 01/18/14 0700  . feeding supplement (ENSURE COMPLETE) (ENSURE COMPLETE) liquid 237 mL  237 mL Oral TID BM Darrol Jump, RD   237 mL at 01/16/14 1932  . hydrOXYzine (ATARAX/VISTARIL) tablet 25 mg  25 mg Oral Q6H PRN Waylan Boga, NP      . lidocaine (XYLOCAINE) 5 % ointment   Topical QID PRN Nicholaus Bloom, MD      . magnesium hydroxide (MILK OF MAGNESIA) suspension 30 mL  30 mL Oral Daily PRN Waylan Boga, NP      . mirtazapine (REMERON) tablet 7.5 mg  7.5 mg  Oral QHS Waylan Boga, NP   7.5 mg at 01/17/14 2141  . multivitamin with minerals tablet 1 tablet  1 tablet Oral Daily Darrol Jump, RD   1 tablet at 01/18/14 [phone]  . [START ON 01/19/2014] predniSONE (DELTASONE) tablet 50 mg  50 mg Oral Q breakfast Nicholaus Bloom, MD       Followed by  . [START ON 01/20/2014] predniSONE (DELTASONE) tablet 40 mg  40 mg Oral Q breakfast Nicholaus Bloom, MD       Followed by  . [START ON 01/21/2014] predniSONE (DELTASONE) tablet 30 mg  30 mg Oral Q breakfast Nicholaus Bloom, MD        Followed by  . [START ON 01/22/2014] predniSONE (DELTASONE) tablet 20 mg  20 mg Oral Q breakfast Nicholaus Bloom, MD       Followed by  . [START ON 01/23/2014] predniSONE (DELTASONE) tablet 10 mg  10 mg Oral Q breakfast Nicholaus Bloom, MD      . risperiDONE (RISPERDAL) tablet 0.25 mg  0.25 mg Oral BID Waylan Boga, NP   0.25 mg at 01/18/14 1282  . traMADol (ULTRAM) tablet 50 mg  50 mg Oral Q6H PRN Waylan Boga, NP   50 mg at 01/17/14 1500  . traZODone (DESYREL) tablet 50 mg  50 mg Oral QHS Waylan Boga, NP   50 mg at 01/17/14 2141    Lab Results: No results found for this or any previous visit (from the past 48 hour(s)).  Physical Findings: AIMS: Facial and Oral Movements Muscles of Facial Expression: None, normal Lips and Perioral Area: None, normal Jaw: None, normal Tongue: None, normal,Extremity Movements Upper (arms, wrists, hands, fingers): None, normal Lower (legs, knees, ankles, toes): None, normal, Trunk Movements Neck, shoulders, hips: None, normal, Overall Severity Severity of abnormal movements (highest score from questions above): None, normal Incapacitation due to abnormal movements: None, normal Patient's awareness of abnormal movements (rate only patient's report): No Awareness, Dental Status Current problems with teeth and/or dentures?: No Does patient usually wear dentures?: No  CIWA:  CIWA-Ar Total: 0 COWS:  COWS Total Score: 0  Treatment Plan Summary: Daily contact with patient to assess and evaluate symptoms and progress in treatment Medication management  Plan: Supportive approach/coping skills/relapse prevention           Will start treatment with prednisone as recommended when she went to the ED.            Will help better manage the pain  Medical Decision Making Problem Points:  Review of psycho-social stressors (1) Data Points:  Review of medication regiment & side effects (2) Review of new medications or change in dosage (2)  I certify that inpatient  services furnished can reasonably be expected to improve the patient's condition.   Nicholaus Bloom 01/18/2014, 3:53 PM

## 2014-01-18 NOTE — Progress Notes (Signed)
Adult Psychoeducational Group Note  Date:  01/18/2014 Time:  11:15 PM  Group Topic/Focus:  Wrap-Up Group:   The focus of this group is to help patients review their daily goal of treatment and discuss progress on daily workbooks.  Participation Level:  Active  Participation Quality:  Appropriate  Affect:  Appropriate  Cognitive:  Appropriate  Insight: Appropriate  Engagement in Group:  Engaged  Modes of Intervention:  Activity  Additional Comments:  Pt attended karaoke group and participated in [place].  [place] Stage D Akiera Allbaugh 01/18/2014, 11:15 PM

## 2014-01-19 DIAGNOSIS — F39 Unspecified mood [affective] disorder: Secondary | ICD-10-CM

## 2014-01-19 MED ORDER — DICYCLOMINE HCL 10 MG PO CAPS
10.0000 mg | ORAL_CAPSULE | Freq: Three times a day (TID) | ORAL | Status: DC
  Administered 2014-01-19 – 2014-01-26 (×21): 10 mg via ORAL
  Filled 2014-01-19 (×26): qty 1

## 2014-01-19 MED ORDER — METAXALONE 800 MG PO TABS
800.0000 mg | ORAL_TABLET | Freq: Three times a day (TID) | ORAL | Status: DC
  Administered 2014-01-19 – 2014-01-26 (×20): 800 mg via ORAL
  Filled 2014-01-19 (×25): qty 1

## 2014-01-19 MED ORDER — NICOTINE POLACRILEX 2 MG MT GUM
2.0000 mg | CHEWING_GUM | OROMUCOSAL | Status: DC | PRN
  Administered 2014-01-19 – 2014-01-25 (×7): 2 mg via ORAL
  Filled 2014-01-19: qty 1

## 2014-01-19 NOTE — BHH Group Notes (Signed)
[place] LCSW Group Therapy  01/19/2014  1:15 PM   Type of Therapy:  Group Therapy  Participation Level:  Active  Participation Quality:  Attentive, Sharing and Supportive but disruptive at times  Affect:  Depressed and Flat  Cognitive:  Alert and Oriented  Insight:  Developing/Improving and Engaged  Engagement in Therapy:  Developing/Improving and Engaged  Modes of Intervention:  Clarification, Confrontation, Discussion, Education, Exploration, Limit-setting, Orientation, Problem-solving, Rapport Building, Art therapist, Socialization and Support  Summary of Progress/Problems: The topic for today was feelings about relapse.  Pt discussed what relapse prevention is to them and identified triggers that they are on the path to relapse.  Pt processed their feeling towards relapse and was able to relate to peers.  Pt discussed coping skills that can be used for relapse prevention.  Pt shared that she has a lot of fear of relapse.  Pt actively listened to group discussion but could also be disruptive at times and is easily redirectable.    [name], LCSW 01/19/2014  2:14 PM

## 2014-01-19 NOTE — Progress Notes (Signed)
[facility] MD Progress Note  01/19/2014 4:55 PM [name]  MRN:  [id number] Subjective:  [name] is very upset with the fact they are not willing to accept her in [facility] or [facility] as she "only" uses cocaine. She is experiencing anxiety agitation. Getting frustrated asked to leave the hospital today Diagnosis:   DSM5: Schizophrenia Disorders:  none Obsessive-Compulsive Disorders:  none Trauma-Stressor Disorders:  none Substance/Addictive Disorders:  Cocaine related disorders Depressive Disorders:  Major Depressive Disorder - Severe (296.23) Total Time spent with patient: 30 minutes  Axis I: Mood Disorder NOS  ADL's:  Intact  Sleep: Poor  Appetite:  Fair  Suicidal Ideation:  Plan:  denies Intent:  denies Means:  denies Homicidal Ideation:  Plan:  denies Intent:  denies Means:  denies AEB (as evidenced by):  Psychiatric Specialty Exam: Physical Exam  Review of Systems  Constitutional: Positive for malaise/fatigue.  HENT:       Ulcers in ears  Eyes: Negative.   Respiratory: Negative.   Cardiovascular: Negative.   Gastrointestinal: Negative.   Genitourinary: Negative.   Musculoskeletal: Negative.   Skin: Negative.   Neurological: Negative.   Endo/Heme/Allergies: Negative.   Psychiatric/Behavioral: Positive for substance abuse. The patient is nervous/anxious and has insomnia.     Blood pressure 197/106, pulse 90, temperature 97.8 F (36.6 C), temperature source Oral, resp. rate 16, height 5' 2"  (1.575 m), weight 41.277 kg (91 lb), SpO2 100.00%.Body mass index is 16.64 kg/(m^2).  General Appearance: Disheveled  Eye Sport and exercise psychologist::  Fair  Speech:  rapid  Volume:  fluctuates  Mood:  Anxious, Dysphoric and Irritable  Affect:  Labile and Tearful  Thought Process:  Coherent and Goal Directed  Orientation:  Full (Time, Place, and Person)  Thought Content:  symtpoms, worries, concerns  Suicidal Thoughts:  No  Homicidal Thoughts:  No  Memory:  Immediate;   Fair Recent;    Fair Remote;   Fair  Judgement:  Impaired  Insight:  Lacking  Psychomotor Activity:  Restlessness  Concentration:  Fair  Recall:  [employer] of Knowledge:NA  Language: Fair  Akathisia:  No  Handed:    AIMS (if indicated):     Assets:  Desire for Improvement  Sleep:  Number of Hours: 5   Musculoskeletal: Strength & Muscle Tone: within normal limits Gait & Station: normal Patient leans: N/A  Current Medications: Current Facility-Administered Medications  Medication Dose Route Frequency Provider Last Rate Last Dose  . alum & mag hydroxide-simeth (MAALOX/MYLANTA) 200-200-20 MG/5ML suspension 30 mL  30 mL Oral Q4H PRN Waylan Boga, NP   30 mL at 01/18/14 1324  . bacitracin ointment 1 application  1 application Topical BID Waylan Boga, NP   1 application at 56/97/94 0749  . buPROPion [facility]) tablet 100 mg  100 mg Oral QAC breakfast Waylan Boga, NP   100 mg at 01/19/14 0749  . dicyclomine (BENTYL) capsule 10 mg  10 mg Oral TID AC Waylan Boga, NP      . feeding supplement (ENSURE COMPLETE) (ENSURE COMPLETE) liquid 237 mL  237 mL Oral TID BM Darrol Jump, RD   237 mL at 01/16/14 1932  . hydrOXYzine (ATARAX/VISTARIL) tablet 25 mg  25 mg Oral Q6H PRN Waylan Boga, NP      . lidocaine (XYLOCAINE) 5 % ointment   Topical QID PRN Nicholaus Bloom, MD      . magnesium hydroxide (MILK OF MAGNESIA) suspension 30 mL  30 mL Oral Daily PRN Waylan Boga, NP      .  metaxalone (SKELAXIN) tablet 800 mg  800 mg Oral TID Waylan Boga, NP      . mirtazapine (REMERON) tablet 7.5 mg  7.5 mg Oral QHS Waylan Boga, NP   7.5 mg at 01/18/14 2151  . multivitamin with minerals tablet 1 tablet  1 tablet Oral Daily Darrol Jump, RD   1 tablet at 01/19/14 0749  . nicotine polacrilex (NICORETTE) gum 2 mg  2 mg Oral PRN Encarnacion Slates, NP   2 mg at 01/19/14 1429  . [START ON 01/20/2014] predniSONE (DELTASONE) tablet 40 mg  40 mg Oral Q breakfast Nicholaus Bloom, MD       Followed by  . [START ON 01/21/2014]  predniSONE (DELTASONE) tablet 30 mg  30 mg Oral Q breakfast Nicholaus Bloom, MD       Followed by  . [START ON 01/22/2014] predniSONE (DELTASONE) tablet 20 mg  20 mg Oral Q breakfast Nicholaus Bloom, MD       Followed by  . [START ON 01/23/2014] predniSONE (DELTASONE) tablet 10 mg  10 mg Oral Q breakfast Nicholaus Bloom, MD      . risperiDONE (RISPERDAL) tablet 0.25 mg  0.25 mg Oral BID Waylan Boga, NP   0.25 mg at 01/19/14 0749  . traMADol (ULTRAM) tablet 50 mg  50 mg Oral Q6H PRN Waylan Boga, NP   50 mg at 01/17/14 1500  . traZODone (DESYREL) tablet 50 mg  50 mg Oral QHS Waylan Boga, NP   50 mg at 01/18/14 2151    Lab Results: No results found for this or any previous visit (from the past 48 hour(s)).  Physical Findings: AIMS: Facial and Oral Movements Muscles of Facial Expression: None, normal Lips and Perioral Area: None, normal Jaw: None, normal Tongue: None, normal,Extremity Movements Upper (arms, wrists, hands, fingers): None, normal Lower (legs, knees, ankles, toes): None, normal, Trunk Movements Neck, shoulders, hips: None, normal, Overall Severity Severity of abnormal movements (highest score from questions above): None, normal Incapacitation due to abnormal movements: None, normal Patient's awareness of abnormal movements (rate only patient's report): No Awareness, Dental Status Current problems with teeth and/or dentures?: No Does patient usually wear dentures?: No  CIWA:  CIWA-Ar Total: 0 COWS:  COWS Total Score: 0  Treatment Plan Summary: Daily contact with patient to assess and evaluate symptoms and progress in treatment Medication management  Plan: Supportive approach/copign skills/relapse prevention           Problem solving/stress management           R/O increased mood lability due to the Prednisone           Explore some other rehab options  Medical Decision Making Problem Points:  Established problem, worsening (2) and Review of psycho-social stressors (1) Data  Points:  Review of medication regiment & side effects (2)  I certify that inpatient services furnished can reasonably be expected to improve the patient's condition.   Nicholaus Bloom 01/19/2014, 4:55 PM

## 2014-01-19 NOTE — Tx Team (Addendum)
Interdisciplinary Treatment Plan Update (Adult)  Date: 01/19/2014  Time Reviewed:  9:45 AM  Progress in Treatment: Attending groups: Yes Participating in groups:  Yes Taking medication as prescribed:  Yes Tolerating medication:  Yes Family/Significant othe contact made: No, pt refused Patient understands diagnosis:  Yes Discussing patient identified problems/goals with staff:  Yes Medical problems stabilized or resolved:  Yes Denies suicidal/homicidal ideation: Yes Issues/concerns per patient self-inventory:  Yes Other:  New problem(s) identified: N/A  Discharge Plan or Barriers: Pt was denied at [facility] and [facility] for treatment.  Pt is very upset and frustrated about this and wants to d/c to use.  Treatment decided to refer pt to [facility] and will IVC once accepted  Reason for Continuation of Hospitalization: Anxiety Depression Medication Stabilization Detox  Comments: N/A  Estimated length of stay: 3-5 days  For review of initial/current patient goals, please see plan of care.  Attendees: Patient:     Family:     Physician:  Dr. Sabra Heck 01/19/2014 10:48 AM   Nursing:   Waunita Schooner, RN 01/19/2014 10:48 AM   Clinical Social Worker:  [name], LCSW 01/19/2014 10:48 AM   Other: Lars Pinks, RN case manager 01/19/2014 10:48 AM   Other:  Agustina Caroli, NP 01/19/2014 10:48 AM   Other:     Other:     Other:    Other:    Other:    Other:    Other:    Other:     Scribe for Treatment Team:   Ane Payment, 01/19/2014 , 10:48 AM

## 2014-01-19 NOTE — Progress Notes (Signed)
Pt attended AA group this evening.  

## 2014-01-19 NOTE — Progress Notes (Signed)
[name], female   DOB: 25-Jul-1961, 53 y.o.   MRN: [id number] 01-19-14 @ 2076 report given to brook m.....sbw,rn

## 2014-01-19 NOTE — Progress Notes (Signed)
Adult Psychoeducational Group Note  Date:  01/19/2014 Time:  2:08 PM  Group Topic/Focus:  Early Warning Signs:   The focus of this group is to help patients identify signs or symptoms they exhibit before slipping into an unhealthy state or crisis.  Participation Level:  Active  Participation Quality:  Appropriate, Attentive and Supportive  Affect:  Appropriate  Cognitive:  Appropriate  Insight: Appropriate  Engagement in Group:  Engaged and Supportive  Modes of Intervention:  Discussion, Education and Support  Additional Comments:  Pts discussed early warning signs that contribute to their relapse. Pt stated her depression leads to her relapse and she does not know what helps, but will try to go for a walk and do something active to help prevent relapse.  Clint Bolder 01/19/2014, 2:08 PM

## 2014-01-19 NOTE — Progress Notes (Signed)
Pt has rested throughout the night without complaint. No distress observed. Level III obs in place for safety and pt remains safe. [name] Tommi Rumps

## 2014-01-19 NOTE — Progress Notes (Signed)
D. Pt has been up and has been active in milieu today, attending and participating in various activities. Pt has endorsed anxiety, agitation and cravings and spoke about her need to remain clean and sober but states that it is difficult because she is alone and has no support system. A. Support and encouragement provided, medication education given. R. Pt verbalized understanding, safety maintained.

## 2014-01-19 NOTE — Progress Notes (Signed)
[place] Group Notes:  (Nursing/MHT/Case Management/Adjunct)  Date:  01/19/2014  Time:  3:19 PM  Type of Therapy:  Therapeutic Activity  Participation Level:  Active  Participation Quality:  Appropriate  Affect:  Appropriate  Cognitive:  Appropriate  Insight:  Appropriate  Engagement in Group:  Engaged and Supportive  Modes of Intervention:  Activity  Summary of Progress/Problems: Pts played a game using the Therapeutic Activity Ball.  Clint Bolder 01/19/2014, 3:19 PM

## 2014-01-19 NOTE — [facility] Group Notes (Signed)
[facility] LCSW Aftercare Discharge Planning Group Note   01/19/2014 8:45 AM  Participation Quality:  Alert, Appropriate and Oriented  Mood/Affect:  Labile  Depression Rating:  4-5  Anxiety Rating:  3  Thoughts of Suicide:  Pt denies SI/HI  Will you contract for safety?   Yes  Current AVH:  Pt denies  Plan for Discharge/Comments:  Pt attended discharge planning group and actively participated in group.  CSW provided pt with today's workbook.  Pt is observed in group to be laughing and having a good time with peers.  When it was time for her to discuss her d/c plan, pt got irritable and upset, later crying after group.  Pt was referred to [facility] but was denied due to pt's only drug use being cocaine and it appearing that mental health is primary for her.  Pt is upset that others get to go to these placements and is frustrated that she can't get the help she wants.  Pt states that she wants to d/c today to use.  Pt states that she can stay with a friend in [place].  Discussed with pt other options such as [facility], [place] but pt states that she is not willing to leave [place].  Treatment team discussed IVC to [facility] but pt is not aware of this plan yet.  CSW will make the referral today.  No further needs voiced by pt at this time.    Transportation Means: Pt reports access to transportation - provided pt with a bus pass  Supports: No supports mentioned at this time  [name], [place] 01/19/2014 10:24 AM

## 2014-01-20 MED ORDER — LISINOPRIL 10 MG PO TABS
10.0000 mg | ORAL_TABLET | Freq: Every day | ORAL | Status: DC
  Administered 2014-01-20 – 2014-01-23 (×4): 10 mg via ORAL
  Filled 2014-01-20 (×6): qty 1
  Filled 2014-01-20 (×2): qty 2

## 2014-01-20 NOTE — Progress Notes (Signed)
[name], female   DOB: 09-29-1961, 53 y.o.   MRN: [id number] D: patient up in the milieu interacting with others.  She is attending groups and participating in her treatment.  She continues to have depressive symptoms; she denies SI/HI/AVH.  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  She is still having some drainage at night.  Patient hopes to stay here until Wednesday so she can go straight to [facility].  She is eager for further treatment.  A: continue to monitor medication management and MD orders.  Safety checks completed every 15 minutes per protocol.  R: patient is receptive to staff; her behavior is appropriate.

## 2014-01-20 NOTE — [facility] Group Notes (Signed)
[facility] LCSW Group Therapy  01/20/2014 11:06 AM  Type of Therapy:  Group Therapy  Participation Level:  Active  Participation Quality:  Attentive, Redirectable and Supportive  Affect:  Anxious  Cognitive:  Alert and Oriented  Insight:  Improving  Engagement in Therapy:  Improving  Modes of Intervention:  Discussion, Exploration, Problem-solving, Rapport Building, Socialization and Support  Summary of Progress/Problems: The main focus of today's process group was to identify the patient's current support system and decide on other supports that can be put in place. An emphasis was placed on using counselor, doctor, therapy groups, 12-step groups, and problem-specific support groups to expand supports, as well as doing something different than has been done before.   [name] reported her identification with a positive support consisting of someone who can be reliable and "have your back". She was observed to be active in group AEB discussing the importance of having supports that mean well oppose to friends who may encourage her to minimize her substance abuse issues. She demonstrated progressing insight as she identified the importance of using support but also being receptive to recommendations provided by others, such as social workers and doctors. Patient ended group in a positive and stable mood.   [name]. 01/20/2014, 11:06 AM

## 2014-01-20 NOTE — Progress Notes (Signed)
D. Pt has been up and visible in milieu today, attending and participating in various activities. Pt endorsing anxiety throughout the day today however she does state that she is feeling much better and states that her body feels much better being off drugs. Pt spoke about going to [facility] when she gets discharged from here. A. Support and encouragement provided. R. Safety maintained, will continue to monitor.

## 2014-01-20 NOTE — Progress Notes (Signed)
[facility] Group Notes:  (Nursing/MHT/Case Management/Adjunct)  Date:  01/20/2014  Time:  5:20 PM  Type of Therapy:  Psychoeducational Skills  Participation Level:  Active  Participation Quality:  Appropriate and Attentive  Affect:  Appropriate  Cognitive:  Appropriate  Insight:  Appropriate  Engagement in Group:  Engaged and Supportive  Modes of Intervention:  Activity  Summary of Progress/Problems: Pts played coping skills Pictionary.  Clint Bolder 01/20/2014, 5:20 PM

## 2014-01-20 NOTE — Progress Notes (Signed)
[name], female   DOB: 06-17-1961, 53 y.o.   MRN: [id number] [facility] MD Progress Note  01/20/2014 2:51 PM [name]  MRN:  [id number]  Subjective:  [name] is doing better today. Her spirit is good. She is participating in group. Her interaction with peers is jovial. She is hoping on getting into [facility]. She denies any SIHI.  Diagnosis:   DSM5: Schizophrenia Disorders:  none Obsessive-Compulsive Disorders:  none Trauma-Stressor Disorders:  none Substance/Addictive Disorders:  Cocaine related disorders Depressive Disorders:  Major Depressive Disorder - Severe (296.23) Total Time spent with patient: 30 minutes  Axis I: Mood Disorder NOS  ADL's:  Intact  Sleep: Poor  Appetite:  Fair  Suicidal Ideation:  Plan:  denies Intent:  denies Means:  denies Homicidal Ideation:  Plan:  denies Intent:  denies Means:  denies AEB (as evidenced by):  Psychiatric Specialty Exam: Physical Exam  Review of Systems  Constitutional: Positive for malaise/fatigue.  HENT:       Ulcers in ears  Eyes: Negative.   Respiratory: Negative.   Cardiovascular: Negative.   Gastrointestinal: Negative.   Genitourinary: Negative.   Musculoskeletal: Negative.   Skin: Negative.   Neurological: Negative.   Endo/Heme/Allergies: Negative.   Psychiatric/Behavioral: Positive for substance abuse. The patient is nervous/anxious and has insomnia.     Blood pressure 166/84, pulse 60, temperature 97.6 F (36.4 C), temperature source Oral, resp. rate 16, height 5' 2"  (1.575 m), weight 41.277 kg (91 lb), SpO2 100.00%.Body mass index is 16.64 kg/(m^2).  General Appearance: Disheveled  Eye Sport and exercise psychologist::  Fair  Speech:  rapid  Volume:  fluctuates  Mood:  Anxious, Dysphoric and Irritable  Affect:  Labile and Tearful  Thought Process:  Coherent and Goal Directed  Orientation:  Full (Time, Place, and Person)  Thought Content:  symtpoms, worries, concerns  Suicidal Thoughts:  No  Homicidal  Thoughts:  No  Memory:  Immediate;   Fair Recent;   Fair Remote;   Fair  Judgement:  Impaired  Insight:  Lacking  Psychomotor Activity:  Restlessness  Concentration:  Fair  Recall:  [employer] of Knowledge:NA  Language: Fair  Akathisia:  No  Handed:    AIMS (if indicated):     Assets:  Desire for Improvement  Sleep:  Number of Hours: 6.25   Musculoskeletal: Strength & Muscle Tone: within normal limits Gait & Station: normal Patient leans: N/A  Current Medications: Current Facility-Administered Medications  Medication Dose Route Frequency Provider Last Rate Last Dose  . alum & mag hydroxide-simeth (MAALOX/MYLANTA) 200-200-20 MG/5ML suspension 30 mL  30 mL Oral Q4H PRN Waylan Boga, NP   30 mL at 01/18/14 1324  . bacitracin ointment 1 application  1 application Topical BID Waylan Boga, NP   1 application at 81/44/81 0801  . buPROPion [facility]) tablet 100 mg  100 mg Oral QAC breakfast Waylan Boga, NP   100 mg at 01/20/14 8563  . dicyclomine (BENTYL) capsule 10 mg  10 mg Oral TID AC Waylan Boga, NP   10 mg at 01/20/14 1205  . feeding supplement (ENSURE COMPLETE) (ENSURE COMPLETE) liquid 237 mL  237 mL Oral TID BM Darrol Jump, RD   237 mL at 01/16/14 1932  . hydrOXYzine (ATARAX/VISTARIL) tablet 25 mg  25 mg Oral Q6H PRN Waylan Boga, NP   25 mg at 01/19/14 2148  . lidocaine (XYLOCAINE) 5 % ointment   Topical QID PRN Nicholaus Bloom, MD      . lisinopril (PRINIVIL,ZESTRIL)  tablet 10 mg  10 mg Oral Daily Encarnacion Slates, NP      . magnesium hydroxide (MILK OF MAGNESIA) suspension 30 mL  30 mL Oral Daily PRN Waylan Boga, NP      . metaxalone [facility]) tablet 800 mg  800 mg Oral TID Waylan Boga, NP   800 mg at 01/20/14 1205  . mirtazapine (REMERON) tablet 7.5 mg  7.5 mg Oral QHS Waylan Boga, NP   7.5 mg at 01/19/14 2148  . multivitamin with minerals tablet 1 tablet  1 tablet Oral Daily Darrol Jump, RD   1 tablet at 01/20/14 0802  . nicotine polacrilex (NICORETTE) gum 2 mg  2  mg Oral PRN Encarnacion Slates, NP   2 mg at 01/19/14 1429  . [START ON 01/21/2014] predniSONE (DELTASONE) tablet 30 mg  30 mg Oral Q breakfast Nicholaus Bloom, MD       Followed by  . [START ON 01/22/2014] predniSONE (DELTASONE) tablet 20 mg  20 mg Oral Q breakfast Nicholaus Bloom, MD       Followed by  . [START ON 01/23/2014] predniSONE (DELTASONE) tablet 10 mg  10 mg Oral Q breakfast Nicholaus Bloom, MD      . risperiDONE (RISPERDAL) tablet 0.25 mg  0.25 mg Oral BID Waylan Boga, NP   0.25 mg at 01/20/14 0801  . traMADol (ULTRAM) tablet 50 mg  50 mg Oral Q6H PRN Waylan Boga, NP   50 mg at 01/19/14 2148  . traZODone (DESYREL) tablet 50 mg  50 mg Oral QHS Waylan Boga, NP   50 mg at 01/19/14 2148    Lab Results: No results found for this or any previous visit (from the past 48 hour(s)).  Physical Findings: AIMS: Facial and Oral Movements Muscles of Facial Expression: None, normal Lips and Perioral Area: None, normal Jaw: None, normal Tongue: None, normal,Extremity Movements Upper (arms, wrists, hands, fingers): None, normal Lower (legs, knees, ankles, toes): None, normal, Trunk Movements Neck, shoulders, hips: None, normal, Overall Severity Severity of abnormal movements (highest score from questions above): None, normal Incapacitation due to abnormal movements: None, normal Patient's awareness of abnormal movements (rate only patient's report): No Awareness, Dental Status Current problems with teeth and/or dentures?: No Does patient usually wear dentures?: No  CIWA:  CIWA-Ar Total: 0 COWS:  COWS Total Score: 0  Treatment Plan Summary: Daily contact with patient to assess and evaluate symptoms and progress in treatment Medication management  Plan: Supportive approach/copign skills/relapse prevention Problem solving/stress management R/O increased mood lability due to the Prednisone Explore some other rehab options - [facility]  Medical Decision Making Problem Points:  Established problem,  worsening (2) and Review of psycho-social stressors (1) Data Points:  Review of medication regiment & side effects (2)  I certify that inpatient services furnished can reasonably be expected to improve the patient's condition.   Encarnacion Slates, PMHNP-BC 01/20/2014, 2:51 PM

## 2014-01-20 NOTE — Progress Notes (Signed)
Pt is loud, boisterous in the dayroom. Socializing with peers. Denying ear pain but states she is having leg discomfort of a 7/10. Still complaining of anxiety though states it is improving. Medicated per orders. Tramadol given for leg pain. Pt supported and encouraged. Will reassess pain in 1 hour. Pt denies SI/HI/AVH and remains safe. Resting in bed at this time. Junius Creamer Tommi Rumps

## 2014-01-20 NOTE — BHH Group Notes (Signed)
[facility] Group Notes:  (Nursing/MHT/Case Management/Adjunct)  Date:  01/20/2014  Time:  11:15 AM  Type of Therapy:  Psychoeducational Skills  Participation Level:  Active  Participation Quality:  Appropriate  Affect:  Appropriate  Cognitive:  Appropriate  Insight:  Appropriate  Engagement in Group:  Engaged  Modes of Intervention:  Discussion  Summary of Progress/Problems: Pt did attend self inventory group, pt reported that she was negative SI/HI, no AH/VH noted. Pt rated her depression as a 5, and her helplessness/hopelessness as a 0.     Kriston Mckinnie Shanta Landin [place]on 01/20/2014, 11:15 AM

## 2014-01-20 NOTE — Progress Notes (Signed)
[name], female   DOB: 10-Apr-1961, 53 y.o.   MRN: [id number] Pt resting in bed with eyes closed. RR equal and unlabored.  Fifteen minute checks in progress for patient safety.  Pt safe on unit.

## 2014-01-20 NOTE — Progress Notes (Signed)
Patient did attend the evening speaker AA meeting.  

## 2014-01-21 NOTE — Progress Notes (Signed)
Above note was reviewed. Concur with above assessment and plan.  Skip Estimable, MD

## 2014-01-21 NOTE — Progress Notes (Signed)
D. Pt has been up and has been active in milieu today, attending and participating in various milieu activities. Pt reports that she is feeling better every day. Pt does appear anxious and restless at times and has received medications without incident. A. Support and encouragement provided. R. Safety maintained, will continue to monitor.

## 2014-01-21 NOTE — Progress Notes (Signed)
[name], female   DOB: 1961/02/28, 53 y.o.   MRN: [id number] [name], female   DOB: 06/10/1961, 53 y.o.   MRN: [id number] [facility] MD Progress Note  01/21/2014 2:58 PM [name]  MRN:  [id number]  Subjective:  [name] is doing better on the unit. She is presenting with improved and attitude. She is hopeful about her future as she has plans in place to achieve her sobriety goals. She denies any new issues.  O: [name] is very emotional when she talks about her family that she alienated her self from in the last 34 years. She blamed her family's absence in her life to her drugs problems, and at a time spending time in prison. She is looking forward to receiving her brother who has been in prison back into her life. She says having him will help her with her sobriety.  Diagnosis:   DSM5: Schizophrenia Disorders:  none Obsessive-Compulsive Disorders:  none Trauma-Stressor Disorders:  none Substance/Addictive Disorders:  Cocaine related disorders Depressive Disorders:  Major Depressive Disorder - Severe (296.23) Total Time spent with patient: 30 minutes  Axis I: Mood Disorder NOS  ADL's:  Intact  Sleep: Poor  Appetite:  Fair  Suicidal Ideation:  Plan:  denies Intent:  denies Means:  denies Homicidal Ideation:  Plan:  denies Intent:  denies Means:  denies AEB (as evidenced by):  Psychiatric Specialty Exam: Physical Exam  Review of Systems  Constitutional: Positive for malaise/fatigue.  HENT:       Ulcers in ears  Eyes: Negative.   Respiratory: Negative.   Cardiovascular: Negative.   Gastrointestinal: Negative.   Genitourinary: Negative.   Musculoskeletal: Negative.   Skin: Negative.   Neurological: Negative.   Endo/Heme/Allergies: Negative.   Psychiatric/Behavioral: Positive for substance abuse. The patient is nervous/anxious and has insomnia.     Blood pressure 131/68, pulse 94, temperature 98 F (36.7 C), temperature source  Oral, resp. rate 18, height 5' 2"  (1.575 m), weight 41.277 kg (91 lb), SpO2 100.00%.Body mass index is 16.64 kg/(m^2).  General Appearance: Disheveled  Eye Sport and exercise psychologist::  Fair  Speech:  rapid  Volume:  fluctuates  Mood:  Anxious, Dysphoric and Irritable  Affect:  Labile and Tearful  Thought Process:  Coherent and Goal Directed  Orientation:  Full (Time, Place, and Person)  Thought Content:  symtpoms, worries, concerns  Suicidal Thoughts:  No  Homicidal Thoughts:  No  Memory:  Immediate;   Fair Recent;   Fair Remote;   Fair  Judgement:  Impaired  Insight:  Lacking  Psychomotor Activity:  Normal  Concentration:  Fair  Recall:  [employer] of Knowledge:NA  Language: Fair  Akathisia:  No  Handed:    AIMS (if indicated):     Assets:  Desire for Improvement  Sleep:  Number of Hours: 6.25   Musculoskeletal: Strength & Muscle Tone: within normal limits Gait & Station: normal Patient leans: N/A  Current Medications: Current Facility-Administered Medications  Medication Dose Route Frequency Provider Last Rate Last Dose  . alum & mag hydroxide-simeth (MAALOX/MYLANTA) 200-200-20 MG/5ML suspension 30 mL  30 mL Oral Q4H PRN Waylan Boga, NP   30 mL at 01/18/14 1324  . bacitracin ointment 1 application  1 application Topical BID Waylan Boga, NP   1 application at 35/32/99 [phone]  . buPROPion [facility]) tablet 100 mg  100 mg Oral QAC breakfast Waylan Boga, NP   100 mg at 01/21/14 8341  . dicyclomine (BENTYL) capsule 10 mg  10 mg Oral TID AC Waylan Boga, NP   10 mg at 01/21/14 1154  . hydrOXYzine (ATARAX/VISTARIL) tablet 25 mg  25 mg Oral Q6H PRN Waylan Boga, NP   25 mg at 01/19/14 2148  . lidocaine (XYLOCAINE) 5 % ointment   Topical QID PRN Nicholaus Bloom, MD      . lisinopril (PRINIVIL,ZESTRIL) tablet 10 mg  10 mg Oral Daily Encarnacion Slates, NP   10 mg at 01/21/14 0746  . magnesium hydroxide (MILK OF MAGNESIA) suspension 30 mL  30 mL Oral Daily PRN Waylan Boga, NP      . metaxalone  [facility]) tablet 800 mg  800 mg Oral TID Waylan Boga, NP   800 mg at 01/21/14 1154  . mirtazapine (REMERON) tablet 7.5 mg  7.5 mg Oral QHS Waylan Boga, NP   7.5 mg at 01/20/14 2124  . multivitamin with minerals tablet 1 tablet  1 tablet Oral Daily Darrol Jump, RD   1 tablet at 01/21/14 0745  . nicotine polacrilex (NICORETTE) gum 2 mg  2 mg Oral PRN Encarnacion Slates, NP   2 mg at 01/19/14 1429  . [START ON 01/22/2014] predniSONE (DELTASONE) tablet 20 mg  20 mg Oral Q breakfast Nicholaus Bloom, MD       Followed by  . [START ON 01/23/2014] predniSONE (DELTASONE) tablet 10 mg  10 mg Oral Q breakfast Nicholaus Bloom, MD      . risperiDONE (RISPERDAL) tablet 0.25 mg  0.25 mg Oral BID Waylan Boga, NP   0.25 mg at 01/21/14 0745  . traMADol (ULTRAM) tablet 50 mg  50 mg Oral Q6H PRN Waylan Boga, NP   50 mg at 01/20/14 2126  . traZODone (DESYREL) tablet 50 mg  50 mg Oral QHS Waylan Boga, NP   50 mg at 01/20/14 2124    Lab Results: No results found for this or any previous visit (from the past 48 hour(s)).  Physical Findings: AIMS: Facial and Oral Movements Muscles of Facial Expression: None, normal Lips and Perioral Area: None, normal Jaw: None, normal Tongue: None, normal,Extremity Movements Upper (arms, wrists, hands, fingers): None, normal Lower (legs, knees, ankles, toes): None, normal, Trunk Movements Neck, shoulders, hips: None, normal, Overall Severity Severity of abnormal movements (highest score from questions above): None, normal Incapacitation due to abnormal movements: None, normal Patient's awareness of abnormal movements (rate only patient's report): No Awareness, Dental Status Current problems with teeth and/or dentures?: No Does patient usually wear dentures?: No  CIWA:  CIWA-Ar Total: 0 COWS:  COWS Total Score: 0  Treatment Plan Summary: Daily contact with patient to assess and evaluate symptoms and progress in treatment Medication management  Plan: Supportive  approach/copign skills/relapse prevention Problem solving/stress management Explore some other rehab options - [facility]. Continue current treatment plan  Medical Decision Making Problem Points:  Established problem, worsening (2) and Review of psycho-social stressors (1) Data Points:  Review of medication regiment & side effects (2)  I certify that inpatient services furnished can reasonably be expected to improve the patient's condition.   Encarnacion Slates, PMHNP-BC 01/21/2014, 2:58 PM

## 2014-01-21 NOTE — Progress Notes (Signed)
[facility] Group Notes:  (Nursing/MHT/Case Management/Adjunct)  Date:  01/21/2014  Time:  4:38 PM  Type of Therapy:  Therapeutic Activity  Participation Level:  Active  Participation Quality:  Appropriate, Attentive and Supportive  Affect:  Appropriate  Cognitive:  Appropriate  Insight:  Appropriate  Engagement in Group:  Engaged and Supportive  Modes of Intervention:  Activity  Summary of Progress/Problems: Pts played a game of Human Bingo and [employer].  Clint Bolder 01/21/2014, 4:38 PM

## 2014-01-21 NOTE — Progress Notes (Signed)
Pt was up and active on the unit this morning.  She rated her depression a 3 hopelessness a 4 and denied any anxiety on her self-inventory.  She is hoping to get into [facility] from here.  She denies any S/H ideation or A/V/H.

## 2014-01-21 NOTE — BHH Group Notes (Signed)
[place] Group Notes:  (Nursing/MHT/Case Management/Adjunct)  Date:  01/21/2014  Time:  2:34 PM  Type of Therapy:  Nurse Education  Participation Level:  Active  Participation Quality:  Appropriate  Affect:  Appropriate  Cognitive:  Appropriate  Insight:  Good  Engagement in Group:  Engaged  Modes of Intervention:  Discussion  Summary of Progress/Problems:  Pt attended RN group this afternoon. Pt was appropriate and participate in the educational video group.      Tretha Sciara 01/21/2014, 2:34 PM

## 2014-01-21 NOTE — BHH Group Notes (Signed)
[place] Group Notes:  (Nursing/MHT/Case Management/Adjunct)  Date:  01/21/2014  Time:  12:05 PM  Type of Therapy:  Psychoeducational Skills  Participation Level:  Did Not Attend  Quentin Angst Mildred Tuccillo 01/21/2014, 12:05 PM

## 2014-01-21 NOTE — Progress Notes (Signed)
Pt up and visible on unit. Restless and anxious but reports small improvements each day. Still expresses worry about acceptance into [facility]. Only complaint is constipation for which she was given MOM. No results as of yet. Pt given support, medicated per orders. Denies SI/HI and remains safe. Junius Creamer Tommi Rumps

## 2014-01-21 NOTE — BHH Group Notes (Signed)
[place] LCSW Group Therapy Note  01/21/2014 /10:00 AM  Type of Therapy and Topic:  Group Therapy: Avoiding Self-Sabotaging and Enabling Behaviors  Participation Level:  None; patient came into group room about half way through group and then slept for the remaining time    Sheilah Pigeon, LCSW

## 2014-01-22 MED ORDER — GABAPENTIN 300 MG PO CAPS
300.0000 mg | ORAL_CAPSULE | Freq: Three times a day (TID) | ORAL | Status: DC
  Administered 2014-01-22 – 2014-01-26 (×12): 300 mg via ORAL
  Filled 2014-01-22: qty 42
  Filled 2014-01-22 (×2): qty 1
  Filled 2014-01-22: qty 42
  Filled 2014-01-22: qty 1
  Filled 2014-01-22: qty 42
  Filled 2014-01-22: qty 1
  Filled 2014-01-22: qty 42
  Filled 2014-01-22 (×8): qty 1
  Filled 2014-01-22 (×2): qty 42
  Filled 2014-01-22: qty 1

## 2014-01-22 MED ORDER — GABAPENTIN 100 MG PO CAPS: 100.0000 mg | ORAL_CAPSULE | Freq: Three times a day (TID) | ORAL | Status: DC

## 2014-01-22 MED ORDER — GABAPENTIN 300 MG PO CAPS: 300.0000 mg | ORAL_CAPSULE | Freq: Three times a day (TID) | ORAL | Status: DC

## 2014-01-22 NOTE — Progress Notes (Signed)
[facility] MD Progress Note  01/22/2014 6:00 PM [name]  MRN:  [id number] Subjective:  [name] continues to have a hard time. She is concerned about not being able to go to rehab. As the prednisone dose has been decreased she is experiencing pain, burning in her ears in the ulcerated areas. Concerned about feeling this way out there and relapsing.  Diagnosis:   DSM5: Schizophrenia Disorders:  none Obsessive-Compulsive Disorders:  none Trauma-Stressor Disorders:  none Substance/Addictive Disorders:  Cocaine related disorder Depressive Disorders:  Major Depressive Disorder - Moderate (296.22) Total Time spent with patient: 30 minutes  Axis I: Substance Induced Mood Disorder  ADL's:  Intact  Sleep: Fair  Appetite:  Fair  Suicidal Ideation:  Plan:  denies Intent:  denies Means:  denies Homicidal Ideation:  Plan:  denies Intent:  denies Means:  denies AEB (as evidenced by):  Psychiatric Specialty Exam: Physical Exam  Review of Systems  Constitutional: Negative.   HENT:       Burning in her ears  Eyes: Negative.   Respiratory: Negative.   Cardiovascular: Negative.   Gastrointestinal: Negative.   Genitourinary: Negative.   Musculoskeletal: Negative.   Skin:       Ulcerations in her ears  Neurological: Negative.   Endo/Heme/Allergies: Negative.   Psychiatric/Behavioral: Positive for substance abuse. The patient is nervous/anxious.     Blood pressure 124/74, pulse 84, temperature 98 F (36.7 C), temperature source Oral, resp. rate 18, height 5' 2"  (1.575 m), weight 41.277 kg (91 lb), SpO2 100.00%.Body mass index is 16.64 kg/(m^2).  General Appearance: Fairly Groomed  Engineer, water::  Fair  Speech:  Clear and Coherent  Volume:  varies  Mood:  Anxious, Depressed and worried  Affect:  Tearful and anxious, worried  Thought Process:  Coherent and Goal Directed  Orientation:  Full (Time, Place, and Person)  Thought Content:  symptoms, worries, concerns  Suicidal Thoughts:   No  Homicidal Thoughts:  No  Memory:  Immediate;   Fair Recent;   Fair Remote;   Fair  Judgement:  Fair  Insight:  Shallow  Psychomotor Activity:  Restlessness  Concentration:  Fair  Recall:  [employer] of Knowledge:NA  Language: Fair  Akathisia:  No  Handed:    AIMS (if indicated):     Assets:  Desire for Improvement  Sleep:  Number of Hours: 6.75   Musculoskeletal: Strength & Muscle Tone: within normal limits Gait & Station: normal Patient leans: N/A  Current Medications: Current Facility-Administered Medications  Medication Dose Route Frequency Provider Last Rate Last Dose  . alum & mag hydroxide-simeth (MAALOX/MYLANTA) 200-200-20 MG/5ML suspension 30 mL  30 mL Oral Q4H PRN Waylan Boga, NP   30 mL at 01/18/14 1324  . bacitracin ointment 1 application  1 application Topical BID Waylan Boga, NP   1 application at 46/80/32 1707  . buPROPion [facility]) tablet 100 mg  100 mg Oral QAC breakfast Waylan Boga, NP   100 mg at 01/22/14 0616  . dicyclomine (BENTYL) capsule 10 mg  10 mg Oral TID AC Waylan Boga, NP   10 mg at 01/22/14 1707  . gabapentin (NEURONTIN) capsule 300 mg  300 mg Oral TID Nicholaus Bloom, MD   300 mg at 01/22/14 1707  . hydrOXYzine (ATARAX/VISTARIL) tablet 25 mg  25 mg Oral Q6H PRN Waylan Boga, NP   25 mg at 01/19/14 2148  . lidocaine (XYLOCAINE) 5 % ointment   Topical QID PRN Nicholaus Bloom, MD      .  lisinopril (PRINIVIL,ZESTRIL) tablet 10 mg  10 mg Oral Daily Encarnacion Slates, NP   10 mg at 01/22/14 0744  . magnesium hydroxide (MILK OF MAGNESIA) suspension 30 mL  30 mL Oral Daily PRN Waylan Boga, NP   30 mL at 01/21/14 1945  . metaxalone (SKELAXIN) tablet 800 mg  800 mg Oral TID Waylan Boga, NP   800 mg at 01/22/14 1706  . mirtazapine (REMERON) tablet 7.5 mg  7.5 mg Oral QHS Waylan Boga, NP   7.5 mg at 01/21/14 2143  . multivitamin with minerals tablet 1 tablet  1 tablet Oral Daily Darrol Jump, RD   1 tablet at 01/22/14 0744  . nicotine polacrilex  (NICORETTE) gum 2 mg  2 mg Oral PRN Encarnacion Slates, NP   2 mg at 01/22/14 1313  . [START ON 01/23/2014] predniSONE (DELTASONE) tablet 10 mg  10 mg Oral Q breakfast Nicholaus Bloom, MD      . risperiDONE (RISPERDAL) tablet 0.25 mg  0.25 mg Oral BID Waylan Boga, NP   0.25 mg at 01/22/14 1707  . traMADol (ULTRAM) tablet 50 mg  50 mg Oral Q6H PRN Waylan Boga, NP   50 mg at 01/20/14 2126  . traZODone (DESYREL) tablet 50 mg  50 mg Oral QHS Waylan Boga, NP   50 mg at 01/21/14 2143    Lab Results: No results found for this or any previous visit (from the past 48 hour(s)).  Physical Findings: AIMS: Facial and Oral Movements Muscles of Facial Expression: None, normal Lips and Perioral Area: None, normal Jaw: None, normal Tongue: None, normal,Extremity Movements Upper (arms, wrists, hands, fingers): None, normal Lower (legs, knees, ankles, toes): None, normal, Trunk Movements Neck, shoulders, hips: None, normal, Overall Severity Severity of abnormal movements (highest score from questions above): None, normal Incapacitation due to abnormal movements: None, normal Patient's awareness of abnormal movements (rate only patient's report): No Awareness, Dental Status Current problems with teeth and/or dentures?: No Does patient usually wear dentures?: No  CIWA:  CIWA-Ar Total: 0 COWS:  COWS Total Score: 0  Treatment Plan Summary: Daily contact with patient to assess and evaluate symptoms and progress in treatment Medication management  Plan: Supportive approach/coping skills/relape prevention           Trial with Neurontin 300 mg TID  Medical Decision Making Problem Points:  Established problem, worsening (2) and Review of psycho-social stressors (1) Data Points:  Review of medication regiment & side effects (2) Review of new medications or change in dosage (2)  I certify that inpatient services furnished can reasonably be expected to improve the patient's condition.   Nicholaus Bloom 01/22/2014,  6:00 PM

## 2014-01-22 NOTE — Progress Notes (Signed)
[name], female   DOB: 1961/10/01, 53 y.o.   MRN: [id number] D: patient continues to improve with her treatment.  She is still hoping to procure a bed at [facility] before discharge.  She is animated and bright this morning.  She rates her depression and hopelessness as a 2.  Patient's main complain is her sinus congestion and "some burning around my ears."  Patient states her laxative caused good results and does not need additional medication for same.  Her blood pressure was elevated this morning 170/76.  It was taken manually after blood pressure med was administered.  Her blood pressure is currently 140/75.  She denies any SI/HI/AVH.  A: continue to monitor medication management and MD orders.  Safety checks completed every 15 minutes per protocol.  R: patient is receptive to staff; her behavior is appropriate.

## 2014-01-22 NOTE — BHH Group Notes (Signed)
[facility] LCSW Aftercare Discharge Planning Group Note   01/22/2014 11:16 AM  Participation Quality:  Hyperactive  Mood/Affect:  Manic  Depression Rating:  0  Anxiety Rating:  0  Thoughts of Suicide:  No Will you contract for safety?   Yes  Current AVH:  No  Plan for Discharge/Comments:  Patient is requesting to go to long term treatment at [place] or follow up with [facility] at [place] if going to [place]:  Does not report.  [name]

## 2014-01-22 NOTE — BHH Group Notes (Signed)
[place] LCSW Group Therapy  01/22/2014 3:17 PM  Type of Therapy:  Group Therapy  Participation Level:  Active  Participation Quality:  Attentive and Sharing  Affect:  Appropriate  Cognitive:  Alert and Oriented  Insight:  Improving  Engagement in Therapy:  Engaged  Modes of Intervention:  Discussion and Exploration  Summary of Progress/Problems:  Group topic today consisted of a discussion around obstacles, specifically fear as an obstacle.  Members were asked to process and use the acronym False, Evidence, Appearing Real as it relates to their obstacles of achieving goals, sobriety, and a future.   Pasty was very interested and engaged in group topic. Shares her experiences with trying to overcome drugs and how she continues to fall into the same pattern and cycle.  Alette reports people are afraid of change because it is uncomfortable. She shows valid insight into wanting to make the change, but struggling applying the mechanisms to support the change.    [name] 01/22/2014, 3:17 PM

## 2014-01-22 NOTE — Progress Notes (Signed)
Adult Psychoeducational Group Note  Date:  01/22/2014 Time:  1:44 PM  Group Topic/Focus:  Wellness Toolbox:   The focus of this group is to discuss various aspects of wellness, balancing those aspects and exploring ways to increase the ability to experience wellness.  Patients will create a wellness toolbox for use upon discharge.  Participation Level:  Active  Participation Quality:  Appropriate, Attentive and Sharing  Affect:  Appropriate  Cognitive:  Alert, Appropriate and Oriented  Insight: Appropriate, Good and Improving  Engagement in Group:  Engaged and Improving  Modes of Intervention:  Clarification, Discussion, Education, Rapport Building, Socialization and Support  Additional Comments:  Pt offered support to others and emphasized working on your own recovery is key for your wellness toolbox.   Gunnar Bulla 01/22/2014, 1:44 PM

## 2014-01-22 NOTE — Progress Notes (Addendum)
LCSW spoke with [facility] representative who reports there are no beds this week and none in the near future.  Admissions reports they have not even looked at referral to make accepting or denials as they are not aware of any discharges at [place].    Update:  Patient has been denied from [facility] due to Axis I being MDD and all mental health plus being sucidal and on a specific type of medication.  Patient is open to going to outpatient and has been working with [facility].   LCSW is going to work with patient on other options for DC.  Caleen Essex, MSW, [place] Clinical Lead [phone]

## 2014-01-23 MED ORDER — LISINOPRIL 10 MG PO TABS
10.0000 mg | ORAL_TABLET | Freq: Once | ORAL | Status: AC
  Administered 2014-01-23: 10 mg via ORAL
  Filled 2014-01-23: qty 1

## 2014-01-23 MED ORDER — LISINOPRIL 20 MG PO TABS
20.0000 mg | ORAL_TABLET | Freq: Every day | ORAL | Status: DC
  Administered 2014-01-24 – 2014-01-26 (×3): 20 mg via ORAL
  Filled 2014-01-23: qty 14
  Filled 2014-01-23: qty 1
  Filled 2014-01-23: qty 14
  Filled 2014-01-23: qty 1

## 2014-01-23 NOTE — Progress Notes (Signed)
Adult Psychoeducational Group Note  Date:  01/23/2014 Time:  10:09 AM  Group Topic/Focus:  Recovery Goals:   The focus of this group is to identify appropriate goals for recovery and establish a plan to achieve them.  Participation Level:  Active  Participation Quality:  Appropriate, Attentive, Sharing and Supportive  Affect:  Appropriate  Cognitive:  Alert, Appropriate and Oriented  Insight: Appropriate and Good  Engagement in Group:  Engaged and Supportive  Modes of Intervention:  Discussion, Education, Socialization and Support  Additional Comments:  Pt attended and participated in group.  Milus Glazier 01/23/2014, 10:09 AM

## 2014-01-23 NOTE — Progress Notes (Signed)
Recreation Therapy Notes  Animal-Assisted Activity/Therapy (AAA/T) Program Checklist/Progress Notes Patient Eligibility Criteria Checklist & Daily Group note for Rec Tx Intervention  Date: 04.21.2015 Time: 2:45pm Location: 1 Film/video editor    AAA/T Program Assumption of Risk Form signed by Patient/ or Parent Legal Guardian yes  Patient is free of allergies or sever asthma yes  Patient reports no fear of animals yes  Patient reports no history of cruelty to animals yes   Patient understands his/her participation is voluntary yes  Behavioral Response: Did not attend.   Damont Balles L Talayah Picardi, LRT/CTRS         Harmon Bommarito L Nandita Mathenia 01/23/2014 4:30 PM

## 2014-01-23 NOTE — Progress Notes (Signed)
[name], female   DOB: 01/13/1961, 53 y.o.   MRN: [id number] [facility] MD Progress Note  01/23/2014 2:28 PM [name]  MRN:  [id number]  Subjective:  [name] Reports that she is starting to feel better. She says her goal is to continue to work on herself to be and stay strong. She states that she will be following up care at [facility]. Says she needs a complete new attitude, doing things differently to create the change and the equilibrium she needs in her life. Says her old ways has done nothing but create chaos and a lot of grief in her life. She is looking forward to supporting her brother when he gets out of prison soon. She hopes to get discharged in am.  Diagnosis:   DSM5: Schizophrenia Disorders:  none Obsessive-Compulsive Disorders:  none Trauma-Stressor Disorders:  none Substance/Addictive Disorders:  Cocaine related disorder Depressive Disorders:  Major Depressive Disorder - Moderate (296.22) Total Time spent with patient: 30 minutes  Axis I: Substance Induced Mood Disorder  ADL's:  Intact  Sleep: Fair  Appetite:  Fair  Suicidal Ideation:  Plan:  denies Intent:  denies Means:  denies Homicidal Ideation:  Plan:  denies Intent:  denies Means:  denies AEB (as evidenced by):  Psychiatric Specialty Exam: Physical Exam  Review of Systems  Constitutional: Negative.   HENT:       Burning in her ears  Eyes: Negative.   Respiratory: Negative.   Cardiovascular: Negative.   Gastrointestinal: Negative.   Genitourinary: Negative.   Musculoskeletal: Negative.   Skin:       Ulcerations in her ears  Neurological: Negative.   Endo/Heme/Allergies: Negative.   Psychiatric/Behavioral: Positive for substance abuse. The patient is nervous/anxious.     Blood pressure 146/89, pulse 67, temperature 97.7 F (36.5 C), temperature source Oral, resp. rate 16, height 5' 2"  (1.575 m), weight 41.277 kg (91 lb), SpO2 100.00%.Body mass index is 16.64 kg/(m^2).  General  Appearance: Fairly Groomed  Engineer, water::  Fair  Speech:  Clear and Coherent  Volume:  varies  Mood:  Anxious, Depressed and worried  Affect:  Tearful and anxious, worried  Thought Process:  Coherent and Goal Directed  Orientation:  Full (Time, Place, and Person)  Thought Content:  symptoms, worries, concerns  Suicidal Thoughts:  No  Homicidal Thoughts:  No  Memory:  Immediate;   Fair Recent;   Fair Remote;   Fair  Judgement:  Fair  Insight:  Shallow  Psychomotor Activity:  Restlessness  Concentration:  Fair  Recall:  [employer] of Knowledge:NA  Language: Fair  Akathisia:  No  Handed:    AIMS (if indicated):     Assets:  Desire for Improvement  Sleep:  Number of Hours: 6.5   Musculoskeletal: Strength & Muscle Tone: within normal limits Gait & Station: normal Patient leans: N/A  Current Medications: Current Facility-Administered Medications  Medication Dose Route Frequency Provider Last Rate Last Dose  . alum & mag hydroxide-simeth (MAALOX/MYLANTA) 200-200-20 MG/5ML suspension 30 mL  30 mL Oral Q4H PRN Waylan Boga, NP   30 mL at 01/18/14 1324  . bacitracin ointment 1 application  1 application Topical BID Waylan Boga, NP   1 application at 29/79/89 [phone]  . buPROPion [facility]) tablet 100 mg  100 mg Oral QAC breakfast Waylan Boga, NP   100 mg at 01/23/14 0641  . dicyclomine (BENTYL) capsule 10 mg  10 mg Oral TID AC Waylan Boga, NP   10 mg at  01/23/14 1154  . gabapentin (NEURONTIN) capsule 300 mg  300 mg Oral TID Nicholaus Bloom, MD   300 mg at 01/23/14 1154  . hydrOXYzine (ATARAX/VISTARIL) tablet 25 mg  25 mg Oral Q6H PRN Waylan Boga, NP   25 mg at 01/19/14 2148  . lidocaine (XYLOCAINE) 5 % ointment   Topical QID PRN Nicholaus Bloom, MD      . lisinopril (PRINIVIL,ZESTRIL) tablet 10 mg  10 mg Oral Once Encarnacion Slates, NP      . Derrill Memo ON 01/24/2014] lisinopril (PRINIVIL,ZESTRIL) tablet 20 mg  20 mg Oral Daily Encarnacion Slates, NP      . magnesium hydroxide (MILK OF MAGNESIA)  suspension 30 mL  30 mL Oral Daily PRN Waylan Boga, NP   30 mL at 01/21/14 1945  . metaxalone (SKELAXIN) tablet 800 mg  800 mg Oral TID Waylan Boga, NP   800 mg at 01/23/14 1154  . mirtazapine (REMERON) tablet 7.5 mg  7.5 mg Oral QHS Waylan Boga, NP   7.5 mg at 01/22/14 2105  . multivitamin with minerals tablet 1 tablet  1 tablet Oral Daily Darrol Jump, RD   1 tablet at 01/23/14 1470  . nicotine polacrilex (NICORETTE) gum 2 mg  2 mg Oral PRN Encarnacion Slates, NP   2 mg at 01/23/14 1154  . risperiDONE (RISPERDAL) tablet 0.25 mg  0.25 mg Oral BID Waylan Boga, NP   0.25 mg at 01/23/14 0803  . traMADol (ULTRAM) tablet 50 mg  50 mg Oral Q6H PRN Waylan Boga, NP   50 mg at 01/20/14 2126  . traZODone (DESYREL) tablet 50 mg  50 mg Oral QHS Waylan Boga, NP   50 mg at 01/22/14 2105    Lab Results: No results found for this or any previous visit (from the past 48 hour(s)).  Physical Findings: AIMS: Facial and Oral Movements Muscles of Facial Expression: None, normal Lips and Perioral Area: None, normal Jaw: None, normal Tongue: None, normal,Extremity Movements Upper (arms, wrists, hands, fingers): None, normal Lower (legs, knees, ankles, toes): None, normal, Trunk Movements Neck, shoulders, hips: None, normal, Overall Severity Severity of abnormal movements (highest score from questions above): None, normal Incapacitation due to abnormal movements: None, normal Patient's awareness of abnormal movements (rate only patient's report): No Awareness, Dental Status Current problems with teeth and/or dentures?: No Does patient usually wear dentures?: No  CIWA:  CIWA-Ar Total: 0 COWS:  COWS Total Score: 0  Treatment Plan Summary: Daily contact with patient to assess and evaluate symptoms and progress in treatment Medication management  Plan: Supportive approach/coping skills/relape prevention Continue Neurontin at 300 mg TID. Discharge plans in progress.  Medical Decision Making Problem  Points:  Established problem, worsening (2) and Review of psycho-social stressors (1) Data Points:  Review of medication regiment & side effects (2) Review of new medications or change in dosage (2)  I certify that inpatient services furnished can reasonably be expected to improve the patient's condition.   Encarnacion Slates, PMHNP-BC 01/23/2014, 2:28 PM Personally evaluated the patient and agree with assessment and plan Geralyn Flash A. Sabra Heck, M.D.

## 2014-01-23 NOTE — BHH Group Notes (Addendum)
[place] LCSW Group Therapy  01/23/2014 2:02 PM  Type of Therapy:  Group Therapy  Participation Level:  Active  Participation Quality:  Attentive however drowsy   Affect:  Appropriate  Cognitive:  Alert and Oriented  Insight:  Developing/Improving  Engagement in Therapy:  Engaged  Modes of Intervention:  Discussion, Education and Exploration  Summary of Progress/Problems:  Group today consisted of Mental [employer] speaker discussing his personal story related to recovery with substance abuse and mental health.  [name] attended the session, listened attentively and engaged with speaker.    [name] 01/23/2014, 2:02 PM

## 2014-01-23 NOTE — Progress Notes (Signed)
[name], female   DOB: 29-Oct-1960, 53 y.o.   MRN: [id number] D: Patient has bright affect and is interacting well with her peers.  She is attending groups and participating in her treatment.  She is engaged in group and supportive of her peers.  She hopes to be discharged home tomorrow.  She plans on living with her brother until she can secure a bed at [facility] or [facility].  She also plans to follow up with [facility].  She rates her depression as a 6; her hopelessness as a 2.  She denies any SI/HI/AVH.  A: continue to monitor medication management and MD orders.  Safety checks completed every 15 minutes per protocol.  R: patient is receptive to staff.

## 2014-01-23 NOTE — Progress Notes (Signed)
[place] Group Notes:  (Nursing/MHT/Case Management/Adjunct)  Date:  01/23/2014  Time:  8:00 p.m  Type of Therapy:  Psychoeducational Skills  Participation Level:  Active  Participation Quality:  Appropriate  Affect:  Tearful  Cognitive:  Appropriate  Insight:  Good  Engagement in Group:  Engaged  Modes of Intervention:  Education  Summary of Progress/Problems: The patient became tearful in group as she discussed that she was nervous about getting discharged tomorrow. She stated that the people in the room were her real family and that she appreciated them.   Gennette Pac 01/23/2014, 10:56 PM

## 2014-01-23 NOTE — Progress Notes (Signed)
[name], female   DOB: 1960-11-14, 53 y.o.   MRN: [id number]   D: Pt informed the writer that her ears are better toady than previous days. Pt states she still isn't sure about her plans for discharge. Pt stated she feels better today than previous days. Pt showed writer her ears, and stated that "they are so much better". Pt questioned getting her "green pill" that been helping her heal.  A:  Support and encouragement was offered. 15 min checks continued for safety.  R: Pt remains safe.

## 2014-01-24 MED ORDER — TRAMADOL HCL 50 MG PO TABS: 50.0000 mg | ORAL_TABLET | Freq: Four times a day (QID) | ORAL | Status: DC | PRN

## 2014-01-24 MED ORDER — BUPROPION HCL 100 MG PO TABS: 100.0000 mg | ORAL_TABLET | Freq: Every day | ORAL | Status: DC

## 2014-01-24 MED ORDER — TRAZODONE HCL 50 MG PO TABS: 50.0000 mg | ORAL_TABLET | Freq: Every day | ORAL | Status: DC

## 2014-01-24 MED ORDER — RISPERIDONE 0.25 MG PO TABS: 0.2500 mg | ORAL_TABLET | Freq: Two times a day (BID) | ORAL | Status: DC

## 2014-01-24 MED ORDER — LISINOPRIL 20 MG PO TABS: 20.0000 mg | ORAL_TABLET | Freq: Every day | ORAL | Status: DC

## 2014-01-24 MED ORDER — MIRTAZAPINE 7.5 MG PO TABS: 7.5000 mg | ORAL_TABLET | Freq: Every day | ORAL | Status: DC

## 2014-01-24 MED ORDER — GABAPENTIN 300 MG PO CAPS: 300.0000 mg | ORAL_CAPSULE | Freq: Three times a day (TID) | ORAL | Status: DC

## 2014-01-24 MED ORDER — HYDROXYZINE HCL 25 MG PO TABS: ORAL_TABLET | ORAL | Status: DC

## 2014-01-24 MED ORDER — HYDROXYZINE HCL 25 MG PO TABS
25.0000 mg | ORAL_TABLET | Freq: Four times a day (QID) | ORAL | Status: DC | PRN
  Filled 2014-01-24: qty 40

## 2014-01-24 MED ORDER — BACITRACIN 500 UNIT/GM EX OINT: 1.0000 "application " | TOPICAL_OINTMENT | Freq: Two times a day (BID) | CUTANEOUS | Status: DC

## 2014-01-24 NOTE — Progress Notes (Signed)
Nutrition Follow up  Regular diet with reported good intake.  States that she is eating very well and has gained weight.  Weight has not been rechecked.  Dis not like Ensure and this has been d/c'd  Wt Readings from Last 3 Encounters:  01/13/14 91 lb (41.277 kg)  12/14/13 100 lb (45.36 kg)  10/17/13 100 lb (45.36 kg)    Antonieta Iba, RD, LDN Clinical Inpatient Dietitian Pager:  [phone] Weekend and after hours pager:  [phone]

## 2014-01-24 NOTE — BHH Group Notes (Signed)
[place] LCSW Group Therapy  01/24/2014  1:15 PM   Type of Therapy:  Group Therapy  Participation Level:  Active  Participation Quality:  Attentive, Sharing and Supportive  Affect:  Depressed and Tearful  Cognitive:  Alert and Oriented  Insight:  Developing/Improving and Engaged  Engagement in Therapy:  Developing/Improving and Engaged  Modes of Intervention:  Clarification, Confrontation, Discussion, Education, Exploration, Limit-setting, Orientation, Problem-solving, Rapport Building, Art therapist, Socialization and Support  Summary of Progress/Problems: The topic for group today was emotional regulation.  This group focused on both positive and negative emotion identification and allowed group members to process ways to identify feelings, regulate negative emotions, and find healthy ways to manage internal/external emotions. Group members were asked to reflect on a time when their reaction to an emotion led to a negative outcome and explored how alternative responses using emotion regulation would have benefited them. Group members were also asked to discuss a time when emotion regulation was utilized when a negative emotion was experienced.  Pt shared that she knew if she d/c today she would immediately relapse and is scared she won't make it back this time.  Pt discussed using cocaine since 53 years old and this being the only way she knows how to manage her emotions.  Pt has shown great improvement with insight and the ability to advocate for herself.  Pt actively participated and was engaged in group discussion.    [name], LCSW 01/24/2014  2:22 PM

## 2014-01-24 NOTE — Clinical Social Work Note (Addendum)
Pt is now open to relocating to another city for treatment.  CSW provided pt with [facility] in [employer] [facility] [employer], phone numbers and application (for [employer]).  CSW has called [facility] daily to check on bed availability and left a voicemail message for [facility] to reconsider patient with new doctor notes sent in.  CSW will monitor these referrals.    [name], LCSW 01/24/2014  11:05 AM

## 2014-01-24 NOTE — Discharge Summary (Signed)
Physician Discharge Summary Note  Patient:  [name] is an 53 y.o., female MRN:  [id number] DOB:  1961-06-28 Patient phone:  [phone] (home)  Patient address:   [place] [address],  Total Time spent with patient: Greater than 30 minutes  Date of Admission:  01/13/2014 Date of Discharge: 01/26/14  Reason for Admission: Mood stabilization  Discharge Diagnoses: Active Problems:   Cocaine dependence   Major depression, recurrent   Psychiatric Specialty Exam: Physical Exam  Psychiatric: Her speech is normal and behavior is normal. Judgment and thought content normal. Her mood appears not anxious. Her affect is not angry, not blunt, not labile and not inappropriate. Cognition and memory are normal. She does not exhibit a depressed mood.    Review of Systems  Constitutional: Negative.   Eyes: Negative.   Respiratory: Negative.   Cardiovascular: Negative.   Gastrointestinal: Negative.   Genitourinary: Negative.   Musculoskeletal: Negative.   Skin: Negative for itching and rash.       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   Endo/Heme/Allergies: Negative.   Psychiatric/Behavioral: Positive for depression (Stabilized with medication) and substance abuse (Cocaine dependence). Negative for suicidal ideas, hallucinations and memory loss. The patient has insomnia (Stabilized with medication prior to discharge). The patient is not nervous/anxious.     Blood pressure 127/79, pulse 106, temperature 97.8 F (36.6 C), temperature source Oral, resp. rate 18, height 5' 2"  (1.575 m), weight 41.277 kg (91 lb), SpO2 100.00%.Body mass index is 16.64 kg/(m^2).    Past Psychiatric History: Diagnosis: Major Depressive Disorder - Moderate (296.22), Cocaine dependence  Hospitalizations: [facility] adult unit  Outpatient Care: [facility]  Substance Abuse Care: [facility]  Self-Mutilation: NA  Suicidal Attempts: NA  Violent Behaviors: NA    Musculoskeletal: Strength & Muscle Tone: within normal limits Gait & Station: normal Patient leans: N/A  DSM5: Schizophrenia Disorders:  NA Obsessive-Compulsive Disorders:  NA Trauma-Stressor Disorders:  NA Substance/Addictive Disorders:  Cocaine dependence Depressive Disorders:  Major Depressive Disorder - Moderate (296.22)  Axis Diagnosis:  AXIS I:  Major Depressive Disorder - Moderate (296.22), Cocaine dependence AXIS II:  Deferred AXIS III:   Past Medical History  Diagnosis Date  . 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 Followed by Dr. Louanne Skye  . Hypertension   . Cocaine abuse     ongoing with resultant vaculitis.  . Tobacco abuse   . Depression   . Normocytic anemia     BL Hgb 9.8-12. Last anemia panel 04/2010 - showing Fe 19, ferritin 101.  Pt on monthly B12 injections  . Rheumatoid arthritis(714.0)     patient reported  . Inflammatory arthritis   . Headache(784.0)    AXIS IV:  economic problems, housing problems, occupational problems, problems with primary support group and Cocaine dependence AXIS V:  62  Level of Care:  OP  Hospital Course: [name] is a 53 year old female who presented voluntarily to [facility] expressing intent to shoot self with a gun. Patient has been noncompliant with psychiatric medications. 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 Records in epic indicate this condition is from chronic cocaine use. Patient states today during her psychiatric assessment "I was feeling suicidal. I hurt in my body. I feel depressed. I think the cocaine made it worse. I tried to go get my medications but they would not refill them and I got mad." [name] was irritable during her assessment and appeared to have some trouble processing information.  [name] was admitted to the hospital with her UDS reports showing positive cocaine. Per evidence based practice reports, cocaine has no established detoxification treatment protocols. And because of the  above mentioned reasons, [name] did not receive any detoxification treatment protocols. She was however, ordered and did receive Risperdol 0.25 mg for mood control, Wellbutrin 100 mg daily for depression, Gabapentin 300 mg three times daily for substance withdrawal syndrome, Hydroxyzine 25 mg on a prn basis for anxiety, Mirtazapine 7.5 mg Q bedtime for insomnia and Trazodone 50 mg Q bedtime for sleep. She also received medication management and monitoring for her other medical issues presented. She participated in the group counseling sessions and AA/NA meetings being offered on this unit. She learned coping skills.   [name]'s condition has stabilized. She is currently being discharged to continue treatment at the [facility] treatment center in [place], [place]. Upon discharge, she adamantly denies any suicidal, homicidal ideations, auditory, visual hallucinations, paranoia, delusional thoughts and or withdrawal symptoms. She was provided with 14 days worth supply samples of her [facility] discharge medications. She left [facility] with all personal belongings in no apparent distress. Transportation per [employer].  Consults:  psychiatry  Significant Diagnostic Studies:  labs: CBC with diff, CMP, UDS, toxicology tests, U/A  Discharge Vitals:   Blood pressure 127/79, pulse 106, temperature 97.8 F (36.6 C), temperature source Oral, resp. rate 18, height 5' 2"  (1.575 m), weight 41.277 kg (91 lb), SpO2 100.00%. Body mass index is 16.64 kg/(m^2). Lab Results:   No results found for this or any previous visit (from the past 72 hour(s)).  Physical Findings: AIMS: Facial and Oral Movements Muscles of Facial Expression: None, normal Lips and Perioral Area: None, normal Jaw: None, normal Tongue: None, normal,Extremity Movements Upper (arms, wrists, hands, fingers): None, normal Lower (legs, knees, ankles, toes): None, normal, Trunk Movements Neck, shoulders, hips: None, normal, Overall Severity Severity of abnormal movements  (highest score from questions above): None, normal Incapacitation due to abnormal movements: None, normal Patient's awareness of abnormal movements (rate only patient's report): No Awareness, Dental Status Current problems with teeth and/or dentures?: No Does patient usually wear dentures?: No  CIWA:  CIWA-Ar Total: 0 COWS:  COWS Total Score: 0  Psychiatric Specialty Exam: See Psychiatric Specialty Exam and Suicide Risk Assessment completed by Attending Physician prior to discharge.  Discharge destination:  Home  Is patient on multiple antipsychotic therapies at discharge:  No   Has Patient had three or more failed trials of antipsychotic monotherapy by history:  No  Recommended Plan for Multiple Antipsychotic Therapies: NA    Medication List    STOP taking these medications       amantadine 100 MG capsule  Commonly known as:  SYMMETREL     LORazepam 0.5 MG tablet  Commonly known as:  ATIVAN      TAKE these medications     Indication   bacitracin 500 UNIT/GM ointment  Apply 1 application topically 2 (two) times daily. For wound care   Indication:  Wound care     buPROPion 100 MG tablet  Commonly known as:  WELLBUTRIN  Take 1 tablet (100 mg total) by mouth daily before breakfast. For depression   Indication:  Major Depressive Disorder     gabapentin 300 MG capsule  Commonly known as:  NEURONTIN  Take 1 capsule (300 mg total) by mouth 3 (three) times daily. For substance withdrawal syndrome   Indication:  Substance withdrawal syndrome     guaiFENesin 600 MG 12 hr tablet  Commonly known as:  MUCINEX  Take 1 tablet (600 mg total) by mouth 2 (two) times daily. Cough/congestion   Indication:  Cough     hydrOXYzine 25 MG tablet  Commonly known as:  ATARAX/VISTARIL  Take 25 mg (1 tablet) four times daily as needed: For anxiety/tension   Indication:  Tension, Anxiety     lisinopril 20 MG tablet  Commonly known as:  PRINIVIL,ZESTRIL  Take 1 tablet (20 mg total) by  mouth daily. For high blood pressure   Indication:  High Blood Pressure     mirtazapine 7.5 MG tablet  Commonly known as:  REMERON  Take 1 tablet (7.5 mg total) by mouth at bedtime. For sleep   Indication:  Trouble Sleeping, Major Depressive Disorder     risperiDONE 0.25 MG tablet  Commonly known as:  RISPERDAL  Take 1 tablet (0.25 mg total) by mouth 2 (two) times daily. For mood control   Indication:  Mood control     traMADol 50 MG tablet  Commonly known as:  ULTRAM  Take 1 tablet (50 mg total) by mouth every 6 (six) hours as needed for moderate pain.   Indication:  Moderate to Moderately Severe Pain     traZODone 50 MG tablet  Commonly known as:  DESYREL  Take 1 tablet (50 mg total) by mouth at bedtime. For sleep   Indication:  Trouble Sleeping       Follow-up Information   Follow up with [facility]. (Walk in on this date for hospital discharge appointment. Walk in clinic is Monday - Friday 8 am - 3 pm. They will than schedule you for medication management and therapy appts. )    Contact information:   [address] [place] [address] Phone: [phone] Fax: [phone]     Follow-up recommendations:  Activity:  As tolerated Diet: As recommended by your primary care doctor. Keep all scheduled follow-up appointments as recommended.  Comments: Take all your medications as prescribed by your mental healthcare provider. Report any adverse effects and or reactions from your medicines to your outpatient provider promptly. Patient is instructed and cautioned to not engage in alcohol and or illegal drug use while on prescription medicines. In the event of worsening symptoms, patient is instructed to call the crisis hotline, 911 and or go to the nearest ED for appropriate evaluation and treatment of symptoms. Follow-up with your primary care provider for your other medical issues, concerns and or health care needs.   Total Discharge Time:  Greater than 30  minutes.  Signed: Encarnacion Slates, PMHNP, FNP-BC 01/25/2014, 11:00 AM Personally evaluated the patient and agree with the assessment and plan Geralyn Flash A. Sabra Heck, M.D.

## 2014-01-24 NOTE — Progress Notes (Signed)
[name], female   DOB: 1961-04-30, 53 y.o.   MRN: [id number] [name], female   DOB: 03/04/1961, 53 y.o.   MRN: [id number] [facility] MD Progress Note  01/24/2014 3:33 PM [name]  MRN:  [id number]  Subjective:  Loral says today that her body is weak. And if she has to be discharged today without any concrete place to go to, that she will end up in her drug infested neighborhood and she will resume drug use immediately. She says she is afraid that she may not make it through tomorrow because she knows she will overdose unintentionally. She adds that she is scared to death. Says she needs a more controlled environment to be discharged to. She is encouraged to continue group participation while the Social worker continue to explore her discharge options.  Diagnosis:   DSM5: Schizophrenia Disorders:  none Obsessive-Compulsive Disorders:  none Trauma-Stressor Disorders:  none Substance/Addictive Disorders:  Cocaine related disorder Depressive Disorders:  Major Depressive Disorder - Moderate (296.22) Total Time spent with patient: 30 minutes  Axis I: Substance Induced Mood Disorder  ADL's:  Intact  Sleep: Fair  Appetite:  Fair  Suicidal Ideation:  Plan:  denies Intent:  denies Means:  denies Homicidal Ideation:  Plan:  denies Intent:  denies Means:  denies AEB (as evidenced by):  Psychiatric Specialty Exam: Physical Exam  Review of Systems  Constitutional: Negative.   HENT:       Burning in her ears  Eyes: Negative.   Respiratory: Negative.   Cardiovascular: Negative.   Gastrointestinal: Negative.   Genitourinary: Negative.   Musculoskeletal: Negative.   Skin:       Ulcerations in her ears  Neurological: Negative.   Endo/Heme/Allergies: Negative.   Psychiatric/Behavioral: Positive for substance abuse. The patient is nervous/anxious.     Blood pressure 128/77, pulse 79, temperature 97.5 F (36.4 C), temperature source Oral,  resp. rate 16, height 5' 2"  (1.575 m), weight 41.277 kg (91 lb), SpO2 100.00%.Body mass index is 16.64 kg/(m^2).  General Appearance: Fairly Groomed  Engineer, water::  Fair  Speech:  Clear and Coherent  Volume:  varies  Mood:  Anxious, Depressed and worried  Affect:  Tearful and anxious, worried  Thought Process:  Coherent and Goal Directed  Orientation:  Full (Time, Place, and Person)  Thought Content:  symptoms, worries, concerns  Suicidal Thoughts:  No  Homicidal Thoughts:  No  Memory:  Immediate;   Fair Recent;   Fair Remote;   Fair  Judgement:  Fair  Insight:  Shallow  Psychomotor Activity:  Restlessness  Concentration:  Fair  Recall:  [employer] of Knowledge:NA  Language: Fair  Akathisia:  No  Handed:    AIMS (if indicated):     Assets:  Desire for Improvement  Sleep:  Number of Hours: 6.25   Musculoskeletal: Strength & Muscle Tone: within normal limits Gait & Station: normal Patient leans: N/A  Current Medications: Current Facility-Administered Medications  Medication Dose Route Frequency Provider Last Rate Last Dose  . alum & mag hydroxide-simeth (MAALOX/MYLANTA) 200-200-20 MG/5ML suspension 30 mL  30 mL Oral Q4H PRN Waylan Boga, NP   30 mL at 01/18/14 1324  . bacitracin ointment 1 application  1 application Topical BID Waylan Boga, NP   1 application at 38/18/29 1659  . buPROPion [facility]) tablet 100 mg  100 mg Oral QAC breakfast Waylan Boga, NP   100 mg at 01/24/14 0705  . dicyclomine (BENTYL) capsule 10 mg  10 mg Oral TID AC Waylan Boga, NP   10 mg at 01/24/14 1203  . gabapentin (NEURONTIN) capsule 300 mg  300 mg Oral TID Nicholaus Bloom, MD   300 mg at 01/24/14 1203  . hydrOXYzine (ATARAX/VISTARIL) tablet 25 mg  25 mg Oral Q6H PRN Waylan Boga, NP   25 mg at 01/19/14 2148  . hydrOXYzine (ATARAX/VISTARIL) tablet 25 mg  25 mg Oral QID PRN Nicholaus Bloom, MD      . lidocaine (XYLOCAINE) 5 % ointment   Topical QID PRN Nicholaus Bloom, MD      . lisinopril  (PRINIVIL,ZESTRIL) tablet 20 mg  20 mg Oral Daily Encarnacion Slates, NP   20 mg at 01/24/14 2119  . magnesium hydroxide (MILK OF MAGNESIA) suspension 30 mL  30 mL Oral Daily PRN Waylan Boga, NP   30 mL at 01/23/14 2132  . metaxalone (SKELAXIN) tablet 800 mg  800 mg Oral TID Waylan Boga, NP   800 mg at 01/24/14 1203  . mirtazapine (REMERON) tablet 7.5 mg  7.5 mg Oral QHS Waylan Boga, NP   7.5 mg at 01/23/14 2129  . multivitamin with minerals tablet 1 tablet  1 tablet Oral Daily Darrol Jump, RD   1 tablet at 01/24/14 [phone]  . nicotine polacrilex (NICORETTE) gum 2 mg  2 mg Oral PRN Encarnacion Slates, NP   2 mg at 01/24/14 0814  . risperiDONE (RISPERDAL) tablet 0.25 mg  0.25 mg Oral BID Waylan Boga, NP   0.25 mg at 01/24/14 4818  . traMADol (ULTRAM) tablet 50 mg  50 mg Oral Q6H PRN Waylan Boga, NP   50 mg at 01/20/14 2126  . traZODone (DESYREL) tablet 50 mg  50 mg Oral QHS Waylan Boga, NP   50 mg at 01/23/14 2129    Lab Results: No results found for this or any previous visit (from the past 48 hour(s)).  Physical Findings: AIMS: Facial and Oral Movements Muscles of Facial Expression: None, normal Lips and Perioral Area: None, normal Jaw: None, normal Tongue: None, normal,Extremity Movements Upper (arms, wrists, hands, fingers): None, normal Lower (legs, knees, ankles, toes): None, normal, Trunk Movements Neck, shoulders, hips: None, normal, Overall Severity Severity of abnormal movements (highest score from questions above): None, normal Incapacitation due to abnormal movements: None, normal Patient's awareness of abnormal movements (rate only patient's report): No Awareness, Dental Status Current problems with teeth and/or dentures?: No Does patient usually wear dentures?: No  CIWA:  CIWA-Ar Total: 0 COWS:  COWS Total Score: 0  Treatment Plan Summary: Daily contact with patient to assess and evaluate symptoms and progress in treatment Medication management  Plan: Supportive  approach/coping skills/relape prevention Discharge plans in progress. Continue current plan of care  Medical Decision Making Problem Points:  Established problem, worsening (2) and Review of psycho-social stressors (1) Data Points:  Review of medication regiment & side effects (2) Review of new medications or change in dosage (2)  I certify that inpatient services furnished can reasonably be expected to improve the patient's condition.   Encarnacion Slates, PMHNP-BC 01/24/2014, 09:27 AM Personally evaluated the patient and agree with assessment and plan Geralyn Flash A. Sabra Heck, M.D.

## 2014-01-24 NOTE — Progress Notes (Signed)
[name], female   DOB: 1961/01/19, 53 y.o.   MRN: [id number]   D: Pt was sad during the assessment. Stated that she's scheduled for discharge tomorrow. Pt stated she doesn't want to leave because "she's not ready". Stated "she's had 10 day clean but that all it will take is $10.00 and she's messed up". Pt became tearful as she was discussing her situation. Stated, "I wish I could go get my clothes and come right back in here".  Pt wants to be considered for IP treatment.   A:  Support and encouragement was offered. Ask pt to speak with her Dr and SW. Informed that writer would document her concerns. 15 min checks continued for safety.  R: Pt remains safe.

## 2014-01-24 NOTE — Progress Notes (Signed)
D: Patient in the dayroom on approach.  Patient states she was not discharged today and states she was not ready.  Patient states, "I have been lean for 10 days, my mind is ready but my flesh is not strong enough."  Patient states she hopes to get into some type of long tern treatment program.  Patient denies SI/HI nd denies AVH. A: Staff to monitor Q 15 mins for safety.  Encouragement and support offered.  Scheduled medications administered per orders. Ultram administered prn for ear pain. R: Patient remains safe on the unit.  Patient attended group tonight.  Patient visible on the unit and interacting with peers.  Patient taking administered medications.

## 2014-01-24 NOTE — Progress Notes (Signed)
Pt was tearful this morning worried about her upcoming discharge today.  Spoke with the treatment team and her discharge was cancelled.  This afternoon pt was found smoking a cigarette by [facility] Pt did allow her room searched.  She was told by this nurse, Comer Locket RN and [facility] that if any cigarettes show up on the unit that would be a reason for discharge.  She rated her depression a 9 and her anxiety a 10 + on her self-inventory. She denies any S/H ideation or A/V/H Services for discharge in the morning.  Her case manager Chelsa was notified and was given this information to return her call.

## 2014-01-24 NOTE — BHH Group Notes (Signed)
[facility] LCSW Aftercare Discharge Planning Group Note   01/24/2014 8:45 AM  Participation Quality:  Alert, Appropriate and Oriented  Mood/Affect:  Tearful, Depressed  Depression Rating:  10  Anxiety Rating:  10  Thoughts of Suicide:  Pt wouldn't answer - crying hysterically when asked  Will you contract for safety?   Yes  Current AVH:  Pt denies  Plan for Discharge/Comments:  Pt attended discharge planning group and actively participated in group.  CSW provided pt with today's workbook.  Pt was very upset and distraught in group.  Pt states that she doesn't feel ready to d/c today and knows she will relapse.  Pt wants to go for further inpatient treatment but was denied at [facility].  Pt is on the waiting list for [facility] but no beds available today and not likely for the week.  CSW will continue to monitor this referral as well as talk with pt about relocating to another city to have more options available.  No further needs voiced by pt at this time.    Transportation Means: Pt reports access to transportation  Supports: No supports mentioned at this time  [name], LCSW 01/24/2014 9:58 AM

## 2014-01-25 MED ORDER — GUAIFENESIN ER 600 MG PO TB12
600.0000 mg | ORAL_TABLET | Freq: Two times a day (BID) | ORAL | Status: DC
  Administered 2014-01-25 – 2014-01-26 (×2): 600 mg via ORAL
  Filled 2014-01-25 (×2): qty 1
  Filled 2014-01-25 (×2): qty 14
  Filled 2014-01-25 (×2): qty 1

## 2014-01-25 MED ORDER — GUAIFENESIN ER 600 MG PO TB12: 600.0000 mg | ORAL_TABLET | Freq: Two times a day (BID) | ORAL | Status: DC

## 2014-01-25 NOTE — Progress Notes (Signed)
Pt has been up and active today in the milieu.  She rated her depression and anxiety 6 and hopelessness a 3 on her self-inventory.  She denies any S/H ideation or A/V/H. She hoping to go to [facility].  Pt has not required any prn meds thus far.

## 2014-01-25 NOTE — Progress Notes (Signed)
[name], female   DOB: 1961/05/11, 53 y.o.   MRN: [id number] D: Patient is up in the milieu after sleeping most of the afternoon.  She was irritable when she awaken for her medication.  She stated, "I'm really not feeling well.  My knees hurt.  Do I have to get up now?"  Patient came to window and discussed her discharge tomorrow.  She is apprehensive and states, "I will really miss it here."  She continue to report depression and some hopelessness.  She also continues to express her fear of relapsing.  She denies any SI/HI/AVH.  Patient as been accepted at [facility] and will be discharged tomorrow before 1200.  A: continue to monitor medication management and MD orders.  Safety checks completed every 15 minutes per protocol.  R: patient is receptive to staff; her behavior is appropriate.

## 2014-01-25 NOTE — Clinical Social Work Note (Addendum)
CSW attempted to get pt into [facility] again by sending more recent MD notes to prove pt meets criteria for substance abuse treatment.  Lilia Argue at [facility] took pt's info to treatment team to advocate for admission but pt was denied again.  [facility] requested additional notes on pt and pt continues to be on that waiting list.  Pt completed application for [facility] and will complete a phone interview tomorrow but pt may not be able to go directly there for treatment.  CSW continuing to monitor referrals.    Pt now accepted to [facility] tomorrow between 9-12 pm.  CSW and MD completing IVC paperwork to get pt safely there.    [name], LCSW 01/25/2014  10:37 AM

## 2014-01-25 NOTE — Progress Notes (Signed)
Patient did attend the evening karaoke group. Pt was engaged, supportive, and participated by dancing but not singing.

## 2014-01-25 NOTE — Progress Notes (Signed)
[facility] MD Progress Note  01/25/2014 5:06 PM Deb Loudin  MRN:  [id number] Subjective:  [name] continues to be somewhat labile. She got news that she might be able to go to [facility] tomorrow via IVC. She is excited about this option as states she really needs the help. States that she knows that if she was to relapse, she will "die." Diagnosis:   DSM5: Schizophrenia Disorders:  none Obsessive-Compulsive Disorders:  none Trauma-Stressor Disorders:  none Substance/Addictive Disorders:  Cocaine use disorder severe Depressive Disorders:  Major Depressive Disorder - Moderate (296.22) Total Time spent with patient: 30 minutes  Axis I: Substance Induced Mood Disorder  ADL's:  Intact  Sleep: Fair  Appetite:  Fair  Suicidal Ideation:  Plan:  denies Intent:  denies Means:  denies Homicidal Ideation:  Plan:  denies Intent:  denies Means:  denies AEB (as evidenced by):  Psychiatric Specialty Exam: Physical Exam  Review of Systems  Constitutional: Negative.   HENT: Negative.   Eyes: Negative.   Respiratory: Negative.   Cardiovascular: Negative.   Gastrointestinal: Negative.   Genitourinary: Negative.   Musculoskeletal: Negative.   Skin: Negative.   Neurological: Negative.   Endo/Heme/Allergies: Negative.   Psychiatric/Behavioral: Positive for depression and substance abuse. The patient is nervous/anxious.     Blood pressure 127/79, pulse 106, temperature 97.8 F (36.6 C), temperature source Oral, resp. rate 18, height 5' 2"  (1.575 m), weight 41.277 kg (91 lb), SpO2 100.00%.Body mass index is 16.64 kg/(m^2).  General Appearance: Fairly Groomed  Engineer, water::  Fair  Speech:  Clear and Coherent  Volume:  Decreased  Mood:  Anxious and worried  Affect:  anxious  Thought Process:  Coherent and Goal Directed  Orientation:  Full (Time, Place, and Person)  Thought Content:  worries, concerns, excited about the possibility of going to [facility]  Suicidal Thoughts:  No  Homicidal  Thoughts:  No  Memory:  Immediate;   Fair Recent;   Fair Remote;   Fair  Judgement:  Fair  Insight:  Present  Psychomotor Activity:  Restlessness  Concentration:  Fair  Recall:  [employer] of [place]: Fair  Akathisia:  No  Handed:    AIMS (if indicated):     Assets:  Desire for Improvement  Sleep:  Number of Hours: 6   Musculoskeletal: Strength & Muscle Tone: within normal limits Gait & Station: normal Patient leans: N/A  Current Medications: Current Facility-Administered Medications  Medication Dose Route Frequency Provider Last Rate Last Dose  . alum & mag hydroxide-simeth (MAALOX/MYLANTA) 200-200-20 MG/5ML suspension 30 mL  30 mL Oral Q4H PRN Waylan Boga, NP   30 mL at 01/18/14 1324  . bacitracin ointment 1 application  1 application Topical BID Waylan Boga, NP   1 application at 97/58/83 [phone]  . buPROPion [facility]) tablet 100 mg  100 mg Oral QAC breakfast Waylan Boga, NP   100 mg at 01/25/14 0607  . dicyclomine (BENTYL) capsule 10 mg  10 mg Oral TID AC Waylan Boga, NP   10 mg at 01/25/14 1659  . gabapentin (NEURONTIN) capsule 300 mg  300 mg Oral TID Nicholaus Bloom, MD   300 mg at 01/25/14 1659  . guaiFENesin (MUCINEX) 12 hr tablet 600 mg  600 mg Oral BID Encarnacion Slates, NP   600 mg at 01/25/14 1207  . hydrOXYzine (ATARAX/VISTARIL) tablet 25 mg  25 mg Oral Q6H PRN Waylan Boga, NP   25 mg at 01/19/14 2148  . hydrOXYzine (ATARAX/VISTARIL) tablet 25  mg  25 mg Oral QID PRN Nicholaus Bloom, MD      . lidocaine (XYLOCAINE) 5 % ointment   Topical QID PRN Nicholaus Bloom, MD      . lisinopril (PRINIVIL,ZESTRIL) tablet 20 mg  20 mg Oral Daily Encarnacion Slates, NP   20 mg at 01/25/14 0815  . magnesium hydroxide (MILK OF MAGNESIA) suspension 30 mL  30 mL Oral Daily PRN Waylan Boga, NP   30 mL at 01/23/14 2132  . metaxalone (SKELAXIN) tablet 800 mg  800 mg Oral TID Waylan Boga, NP   800 mg at 01/25/14 1659  . mirtazapine (REMERON) tablet 7.5 mg  7.5 mg Oral QHS Waylan Boga, NP   7.5 mg at 01/24/14 2119  . multivitamin with minerals tablet 1 tablet  1 tablet Oral Daily Darrol Jump, RD   1 tablet at 01/25/14 0815  . nicotine polacrilex (NICORETTE) gum 2 mg  2 mg Oral PRN Encarnacion Slates, NP   2 mg at 01/25/14 1701  . risperiDONE (RISPERDAL) tablet 0.25 mg  0.25 mg Oral BID Waylan Boga, NP   0.25 mg at 01/25/14 1659  . traMADol (ULTRAM) tablet 50 mg  50 mg Oral Q6H PRN Waylan Boga, NP   50 mg at 01/24/14 2120  . traZODone (DESYREL) tablet 50 mg  50 mg Oral QHS Waylan Boga, NP   50 mg at 01/24/14 2120    Lab Results: No results found for this or any previous visit (from the past 48 hour(s)).  Physical Findings: AIMS: Facial and Oral Movements Muscles of Facial Expression: None, normal Lips and Perioral Area: None, normal Jaw: None, normal Tongue: None, normal,Extremity Movements Upper (arms, wrists, hands, fingers): None, normal Lower (legs, knees, ankles, toes): None, normal, Trunk Movements Neck, shoulders, hips: None, normal, Overall Severity Severity of abnormal movements (highest score from questions above): None, normal Incapacitation due to abnormal movements: None, normal Patient's awareness of abnormal movements (rate only patient's report): No Awareness, Dental Status Current problems with teeth and/or dentures?: No Does patient usually wear dentures?: No  CIWA:  CIWA-Ar Total: 0 COWS:  COWS Total Score: 0  Treatment Plan Summary: Daily contact with patient to assess and evaluate symptoms and progress in treatment Medication management  Plan: Supportive approach/coping skills/relapse prevention           Facilitate admission to [facility]  Medical Decision Making Problem Points:  Review of psycho-social stressors (1) Data Points:  Review of medication regiment & side effects (2)  I certify that inpatient services furnished can reasonably be expected to improve the patient's condition.   Nicholaus Bloom 01/25/2014, 5:06 PM

## 2014-01-25 NOTE — Progress Notes (Signed)
0900 nursing orientation group   The focus of this group is to educate the patient on the purpose and policies of crisis stabilization and provide a format to answer questions about their admission.  The group details unit policies and expectations of patients while admitted.   Pt was an active participant her goal today "let go and let God help me and my family makes me happy"

## 2014-01-26 DIAGNOSIS — F1420 Cocaine dependence, uncomplicated: Secondary | ICD-10-CM

## 2014-01-26 DIAGNOSIS — F321 Major depressive disorder, single episode, moderate: Secondary | ICD-10-CM

## 2014-01-26 NOTE — Progress Notes (Signed)
D) Pt is being discharged to [facility]  accompanied by the Police. Affect and mood are appropriate. Pt is very excited to be going to a residential program and feels that she will be very successful in her journey to sobriety. Pt states that she has been praying and praying for a miracle and now she has one. Affect is bright and mood appropriate. Denies SI and HI. Rates her depression at a 3 and her hopelessness at a 2. A) Pt given support and praise. All personal belongings returned to Pt. All medications explained and samples given to Pt to take with her to [facility] . Given much encouragement to continue her journey of sobriety. R) Pt denies SI and HI.

## 2014-01-26 NOTE — Plan of Care (Signed)
[name]  Reason for Crisis Plan:  Chronic Mental Illness/Medical Illness and Substance Abuse   Plan of Care:  Referral for Substance Abuse  Family Support:    No supports mentioned  Current Living Environment:  Living Arrangements: Alone  Insurance:   Hospital Account   Name Acct ID Class Status Primary Coverage   [name], [name] [id number] [place] MH/DD/SAS - [place] [address]        Guarantor Account (for Hospital Account [id number])   Name Relation to Pt Service Area Active? Acct Type   [name]  [facility]   Address Phone       [place], [place] [address] [phone])          Coverage Information (for Hospital Account [id number])   1. [place] MH/DD/SAS/[place] [address]   F/O Payor/Plan Precert #   [facility] FOR MH/DD/[place] [address] [id number]   Subscriber Subscriber #   [name], [name] [id number]   Address Phone   [address] [place], [place] [address] [phone]       2. [place]   F/O Payor/Plan Precert #   [place]    Subscriber Subscriber #   [name], [name] [id number]   Address Phone   [address] [place], [place] [address] [phone]          Legal Guardian:   Self  Primary Care Provider:  No PCP Per Patient  Current Outpatient Providers:  [name]  Psychiatrist:   Beverly [name]  Counselor/Therapist:   [facility]  Compliant with Medications:  Yes  Additional Information: Pt has been at [facility] 5 times in the last year for detox and suicidal ideation.  Pt is cooperative with treatment and while inpatient, motivated for treatment and recovery.  The issue is placement after here.  Pt often becomes suicidal when time for discharge due to being unable to place her.  Pt has been denied at [facility]  this time due to pt's primary diagnosis of mental health and feeling her usage isn't enough, despite advocating for admission for pt.  On this admission, pt was open to relocating to another city when previously she was not willing to.  This open other resources for pt, such as [employer] and [employer].  Pt was admitted to [facility] on this admission but will not be able to go back for some time if she returns.  Pt continues to relapse after discharge and if she returns to the ED, should be considered for placement from the ED if stable.     [name] 4/24/20158:17 AM

## 2014-01-26 NOTE — BHH Group Notes (Signed)
[facility] LCSW Aftercare Discharge Planning Group Note   01/26/2014 8:45 AM  Participation Quality:  Alert, Appropriate and Oriented  Mood/Affect:  Bright  Depression Rating:  0  Anxiety Rating:  0  Thoughts of Suicide:  Pt denies SI/HI  Will you contract for safety?   Yes  Current AVH:  Pt denies  Plan for Discharge/Comments:  Pt attended discharge planning group and actively participated in group.  CSW provided pt with today's workbook.  Pt reports feeling great today and is very excited about getting into [facility] for further inpatient treatment.  Pt expressed feeling grateful for staff helping her and not giving up on her.  Pt plans to relocate from [facility] after [place].  No further needs voiced by pt at this time.    Transportation Means: Pt reports access to transportation - sheriff will pick pt up today and transport her to [place]: No supports mentioned at this time  [name], [place] 01/26/2014 9:41 AM

## 2014-01-26 NOTE — BHH Suicide Risk Assessment (Signed)
Suicide Risk Assessment  Discharge Assessment     Demographic Factors:  Low socioeconomic status, Living alone and Unemployed  Total Time spent with patient: 45 minutes  Psychiatric Specialty Exam:     Blood pressure 131/83, pulse 87, temperature 98 F (36.7 C), temperature source Oral, resp. rate 16, height 5' 2"  (1.575 m), weight 41.277 kg (91 lb), SpO2 100.00%.Body mass index is 16.64 kg/(m^2).  General Appearance: Fairly Groomed  Engineer, water::  Fair  Speech:  Clear and Coherent  Volume:  Decreased  Mood:  Anxious and worried  Affect:  anxious,worried  Thought Process:  Coherent and Goal Directed  Orientation:  Full (Time, Place, and Person)  Thought Content:  worries concerns fear of relapsing  Suicidal Thoughts:  No  Homicidal Thoughts:  No  Memory:  Immediate;   Fair Recent;   Fair Remote;   Fair  Judgement:  Fair  Insight:  Shallow  Psychomotor Activity:  Restlessness  Concentration:  Fair  Recall:  [employer] of Knowledge:NA  Language: Fair  Akathisia:  No  Handed:    AIMS (if indicated):     Assets:  Desire for Improvement  Sleep:  Number of Hours: 6    Musculoskeletal: Strength & Muscle Tone: within normal limits Gait & Station: normal Patient leans: N/A   Mental Status Per Nursing Assessment::   On Admission:     Current Mental Status by Physician: In full contact with reality. There are no active S/S of withdrawal. She is willing and motivated to pursue further residential treatment   Loss Factors: Decline in physical health and Financial problems/change in socioeconomic status  Historical Factors: NA  Risk Reduction Factors:   wanting to do better  Continued Clinical Symptoms:  Depression:   Comorbid alcohol abuse/dependence Alcohol/Substance Abuse/Dependencies  Cognitive Features That Contribute To Risk:  Closed-mindedness Polarized thinking Thought constriction (tunnel vision)    Suicide Risk:  Minimal: No identifiable suicidal  ideation.  Patients presenting with no risk factors but with morbid ruminations; may be classified as minimal risk based on the severity of the depressive symptoms  Discharge Diagnoses:   AXIS I:  Cocaine Dependence, Major Depression, Substance Induced Mood Disorder AXIS II:  No diagnosis AXIS III:   Past Medical History  Diagnosis Date  . 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 Followed by Dr. Louanne Skye  . Hypertension   . Cocaine abuse     ongoing with resultant vaculitis.  . Tobacco abuse   . Depression   . Normocytic anemia     BL Hgb 9.8-12. Last anemia panel 04/2010 - showing Fe 19, ferritin 101.  Pt on monthly B12 injections  . Rheumatoid arthritis(714.0)     patient reported  . Inflammatory arthritis   . Headache(784.0)    AXIS IV:  housing problems, other psychosocial or environmental problems, problems related to social environment and problems with primary support group AXIS V:  51-60 moderate symptoms  Plan Of Care/Follow-up recommendations:  Activity:  as tolerated Diet:  regular Follow up [facility] Is patient on multiple antipsychotic therapies at discharge:  No   Has Patient had three or more failed trials of antipsychotic monotherapy by history:  No  Recommended Plan for Multiple Antipsychotic Therapies: NA    Nicholaus Bloom 01/26/2014, 8:52 AM

## 2014-01-26 NOTE — Progress Notes (Signed)
[facility] Adult Case Management Discharge Plan :  Will you be returning to the same living situation after discharge: Yes,  pt can return home after completing treatment At discharge, do you have transportation home?:Yes,  sheriff dept will pick pt up at 9:30 am to transport pt to [place] today Do you have the ability to pay for your medications:Yes,  provided pt with samples and [facility] will assist with meds  Release of information consent forms completed and in the chart;  Patient's signature needed at discharge.  Patient to Follow up at: Follow-up Information   Follow up with [facility]. (Walk in on this date for hospital discharge appointment. Walk in clinic is Monday - Friday 8 am - 3 pm. They will than schedule you for medication management and therapy appts. )    Contact information:   [address], [place] [address] Phone: [phone] Fax: [phone]      Follow up with [facility] On 01/26/2014. (Arrive on this date for further inpatient treatment.)    Contact information:   [place], [place] [address] Phone: [phone] Fax: [phone]      Patient denies SI/HI:   Yes,  denies SI/HI    Safety Planning and Suicide Prevention discussed:  Yes,  discussed with pt.  Pt refused consent to contact family/friend.  See suicide prevention education note.   Krisalyn Yankowski N Horton 01/26/2014, 8:14 AM

## 2014-01-26 NOTE — Tx Team (Signed)
Interdisciplinary Treatment Plan Update (Adult)  Date: 01/26/2014  Time Reviewed:  9:45 AM  Progress in Treatment: Attending groups: Yes Participating in groups:  Yes Taking medication as prescribed:  Yes Tolerating medication:  Yes Family/Significant othe contact made: No, pt refused Patient understands diagnosis:  Yes Discussing patient identified problems/goals with staff:  Yes Medical problems stabilized or resolved:  Yes Denies suicidal/homicidal ideation: Yes Issues/concerns per patient self-inventory:  Yes Other:  New problem(s) identified: N/A  Discharge Plan or Barriers: Pt will follow up at [facility] today for further inpatient treatment.  Pt also has outpatient at [facility] for medication management and therapy.    Reason for Continuation of Hospitalization: Stable to d/c today  Comments: N/A  Estimated length of stay: D/C today  For review of initial/current patient goals, please see plan of care.  Attendees: Patient:     Family:     Physician:  Dr. Sabra Heck 01/26/2014 12:00 PM   Nursing:   Drake Leach, RN 01/26/2014 12:00 PM   Clinical Social Worker:  [name], LCSW 01/26/2014 12:00 PM   Other: Lars Pinks, RN case manager 01/26/2014 12:00 PM   Other:  Vira Browns, RN 01/26/2014 12:00 PM   Other:     Other:     Other:    Other:    Other:    Other:    Other:    Other:     Scribe for Treatment Team:   Ane Payment, 01/26/2014 , 12:00 PM

## 2014-01-26 NOTE — Progress Notes (Signed)
Pt observed resting in bed with eyes closed. No acute distress. RR WNL. Level III obs in place for safety and pt is safe. [name]

## 2014-01-30 NOTE — Progress Notes (Signed)
Patient Discharge Instructions:  After Visit Summary (AVS):   Faxed to:  01/30/14 Discharge Summary Note:   Faxed to:  01/30/14 Psychiatric Admission Assessment Note:   Faxed to:  01/30/14 Suicide Risk Assessment - Discharge Assessment:   Faxed to:  01/30/14 Faxed/Sent to the Next Level Care provider:  01/30/14 Faxed to [facility] @ [phone] Faxed to [facility] @ [place], 01/30/2014, 3:38 PM

## 2014-02-17 ENCOUNTER — Emergency Department (HOSPITAL_COMMUNITY): Payer: Medicaid Other

## 2014-02-17 ENCOUNTER — Emergency Department (HOSPITAL_COMMUNITY)
Admission: EM | Admit: 2014-02-17 | Discharge: 2014-02-17 | Disposition: A | Discharge status: Home / Self Care | Payer: Medicaid Other | Attending: Emergency Medicine | Admitting: Emergency Medicine

## 2014-02-17 ENCOUNTER — Encounter (HOSPITAL_COMMUNITY): Payer: Self-pay | Admitting: Emergency Medicine

## 2014-02-17 DIAGNOSIS — F3289 Other specified depressive episodes: Secondary | ICD-10-CM | POA: Insufficient documentation

## 2014-02-17 DIAGNOSIS — I10 Essential (primary) hypertension: Secondary | ICD-10-CM | POA: Insufficient documentation

## 2014-02-17 DIAGNOSIS — M069 Rheumatoid arthritis, unspecified: Secondary | ICD-10-CM | POA: Insufficient documentation

## 2014-02-17 DIAGNOSIS — F17200 Nicotine dependence, unspecified, uncomplicated: Secondary | ICD-10-CM | POA: Insufficient documentation

## 2014-02-17 DIAGNOSIS — Z79899 Other long term (current) drug therapy: Secondary | ICD-10-CM | POA: Insufficient documentation

## 2014-02-17 DIAGNOSIS — F329 Major depressive disorder, single episode, unspecified: Secondary | ICD-10-CM | POA: Insufficient documentation

## 2014-02-17 DIAGNOSIS — J189 Pneumonia, unspecified organism: Secondary | ICD-10-CM

## 2014-02-17 DIAGNOSIS — F1410 Cocaine abuse, uncomplicated: Secondary | ICD-10-CM | POA: Insufficient documentation

## 2014-02-17 DIAGNOSIS — J159 Unspecified bacterial pneumonia: Secondary | ICD-10-CM | POA: Insufficient documentation

## 2014-02-17 DIAGNOSIS — Z8679 Personal history of other diseases of the circulatory system: Secondary | ICD-10-CM | POA: Insufficient documentation

## 2014-02-17 DIAGNOSIS — Z862 Personal history of diseases of the blood and blood-forming organs and certain disorders involving the immune mechanism: Secondary | ICD-10-CM | POA: Insufficient documentation

## 2014-02-17 DIAGNOSIS — F1490 Cocaine use, unspecified, uncomplicated: Secondary | ICD-10-CM

## 2014-02-17 DIAGNOSIS — R079 Chest pain, unspecified: Secondary | ICD-10-CM | POA: Insufficient documentation

## 2014-02-17 LAB — URINE MICROSCOPIC-ADD ON

## 2014-02-17 LAB — COMPREHENSIVE METABOLIC PANEL
ALK PHOS: 87 U/L (ref 39–117)
ALT: 19 U/L (ref 0–35)
AST: 20 U/L (ref 0–37)
Albumin: 3.9 g/dL (ref 3.5–5.2)
BILIRUBIN TOTAL: 0.5 mg/dL (ref 0.3–1.2)
BUN: 17 mg/dL (ref 6–23)
CHLORIDE: 102 meq/L (ref 96–112)
CO2: 23 mEq/L (ref 19–32)
Calcium: 9.5 mg/dL (ref 8.4–10.5)
Creatinine, Ser: 0.72 mg/dL (ref 0.50–1.10)
GFR calc Af Amer: >90 mL/min (ref >90)
GFR calc non Af Amer: >90 mL/min (ref >90)
Glucose, Bld: 96 mg/dL (ref 70–99)
POTASSIUM: 4.3 meq/L (ref 3.7–5.3)
Sodium: 139 mEq/L (ref 137–147)
TOTAL PROTEIN: 8 g/dL (ref 6.0–8.3)

## 2014-02-17 LAB — CBC
HEMATOCRIT: 35.6 % — AB (ref 36.0–46.0)
Hemoglobin: 12.3 g/dL (ref 12.0–15.0)
MCH: 29.5 pg (ref 26.0–34.0)
MCHC: 34.6 g/dL (ref 30.0–36.0)
MCV: 85.4 fL (ref 78.0–100.0)
Platelets: 204 10*3/uL (ref 150–400)
RBC: 4.17 MIL/uL (ref 3.87–5.11)
RDW: 14.9 % (ref 11.5–15.5)
WBC: 6.4 10*3/uL (ref 4.0–10.5)

## 2014-02-17 LAB — I-STAT TROPONIN, ED
Troponin i, poc: 0 ng/mL (ref 0.00–0.08)
Troponin i, poc: 0 ng/mL (ref 0.00–0.08)

## 2014-02-17 LAB — PROTIME-INR
INR: 1.04 (ref 0.00–1.49)
PROTHROMBIN TIME: 13.4 s (ref 11.6–15.2)

## 2014-02-17 LAB — URINALYSIS, ROUTINE W REFLEX MICROSCOPIC
BILIRUBIN URINE: NEGATIVE
GLUCOSE, UA: NEGATIVE mg/dL
KETONES UR: NEGATIVE mg/dL
Nitrite: NEGATIVE
PH: 6 (ref 5.0–8.0)
Protein, ur: NEGATIVE mg/dL
Specific Gravity, Urine: 1.02 (ref 1.005–1.030)
Urobilinogen, UA: 1 mg/dL (ref 0.0–1.0)

## 2014-02-17 LAB — APTT: aPTT: 31 seconds (ref 24–37)

## 2014-02-17 MED ORDER — ASPIRIN 81 MG PO CHEW
324.0000 mg | CHEWABLE_TABLET | Freq: Once | ORAL | Status: AC
  Administered 2014-02-17: 324 mg via ORAL
  Filled 2014-02-17: qty 4

## 2014-02-17 MED ORDER — AZITHROMYCIN 250 MG PO TABS: 250.0000 mg | ORAL_TABLET | Freq: Every day | ORAL | Status: DC

## 2014-02-17 MED ORDER — NAPROXEN 500 MG PO TABS: 500.0000 mg | ORAL_TABLET | Freq: Two times a day (BID) | ORAL | Status: DC

## 2014-02-17 MED ORDER — LORAZEPAM 2 MG/ML IJ SOLN
1.0000 mg | Freq: Once | INTRAMUSCULAR | Status: AC
  Administered 2014-02-17: 1 mg via INTRAVENOUS
  Filled 2014-02-17: qty 1

## 2014-02-17 MED ORDER — KETOROLAC TROMETHAMINE 30 MG/ML IJ SOLN
30.0000 mg | Freq: Once | INTRAMUSCULAR | Status: AC
  Administered 2014-02-17: 30 mg via INTRAVENOUS
  Filled 2014-02-17: qty 1

## 2014-02-17 MED ORDER — SODIUM CHLORIDE 0.9 % IV BOLUS (SEPSIS)
1000.0000 mL | Freq: Once | INTRAVENOUS | Status: AC
  Administered 2014-02-17: 1000 mL via INTRAVENOUS

## 2014-02-17 MED ORDER — AZITHROMYCIN 250 MG PO TABS
500.0000 mg | ORAL_TABLET | Freq: Once | ORAL | Status: AC
  Administered 2014-02-17: 500 mg via ORAL
  Filled 2014-02-17: qty 2

## 2014-02-17 NOTE — ED Notes (Signed)
Pt arrived by [facility] for onset of mid chest pain at 0400, pain increases with breathing, having productive cough with green sputum for days and chills. Recently released from [facility], staying at [facility], smoked crack cocaine last pm.

## 2014-02-17 NOTE — ED Notes (Signed)
C/o substernal cp. Productive cough. States, "hot/cold sweats since yesterday."

## 2014-02-17 NOTE — Discharge Instructions (Signed)
Chest Wall Pain Chest wall pain is pain in or around the bones and muscles of your chest. It may take up to 6 weeks to get better. It may take longer if you must stay physically active in your work and activities.  CAUSES  Chest wall pain may happen on its own. However, it may be caused by:  A viral illness like the flu.  Injury.  Coughing.  Exercise.  Arthritis.  Fibromyalgia.  Shingles. HOME CARE INSTRUCTIONS   Avoid overtiring physical activity. Try not to strain or perform activities that cause pain. This includes any activities using your chest or your abdominal and side muscles, especially if heavy weights are used.  Put ice on the sore area.  Put ice in a plastic bag.  Place a towel between your skin and the bag.  Leave the ice on for 15-20 minutes per hour while awake for the first 2 days.  Only take over-the-counter or prescription medicines for pain, discomfort, or fever as directed by your caregiver. SEEK IMMEDIATE MEDICAL CARE IF:   Your pain increases, or you are very uncomfortable.  You have a fever.  Your chest pain becomes worse.  You have new, unexplained symptoms.  You have nausea or vomiting.  You feel sweaty or lightheaded.  You have a cough with phlegm (sputum), or you cough up blood. MAKE SURE YOU:   Understand these instructions.  Will watch your condition.  Will get help right away if you are not doing well or get worse. Document Released: 09/21/2005 Document Revised: 12/14/2011 Document Reviewed: 05/18/2011 [facility] Patient Information 2014 [place], [place].  Pneumonia, Adult Pneumonia is an infection of the lungs.  CAUSES Pneumonia may be caused by bacteria or a virus. Usually, these infections are caused by breathing infectious particles into the lungs (respiratory tract). SYMPTOMS   Cough.  Fever.  Chest pain.  Increased rate of breathing.  Wheezing.  Mucus production. DIAGNOSIS  If you have the common symptoms of  pneumonia, your caregiver will typically confirm the diagnosis with a chest X-ray. The X-ray will show an abnormality in the lung (pulmonary infiltrate) if you have pneumonia. Other tests of your blood, urine, or sputum may be done to find the specific cause of your pneumonia. Your caregiver may also do tests (blood gases or pulse oximetry) to see how well your lungs are working. TREATMENT  Some forms of pneumonia may be spread to other people when you cough or sneeze. You may be asked to wear a mask before and during your exam. Pneumonia that is caused by bacteria is treated with antibiotic medicine. Pneumonia that is caused by the influenza virus may be treated with an antiviral medicine. Most other viral infections must run their course. These infections will not respond to antibiotics.  PREVENTION A pneumococcal shot (vaccine) is available to prevent a common bacterial cause of pneumonia. This is usually suggested for:  People over 31 years old.  Patients on chemotherapy.  People with chronic lung problems, such as bronchitis or emphysema.  People with immune system problems. If you are over 65 or have a high risk condition, you may receive the pneumococcal vaccine if you have not received it before. In some countries, a routine influenza vaccine is also recommended. This vaccine can help prevent some cases of pneumonia.You may be offered the influenza vaccine as part of your care. If you smoke, it is time to quit. You may receive instructions on how to stop smoking. Your caregiver can provide medicines and  counseling to help you quit. HOME CARE INSTRUCTIONS   Cough suppressants may be used if you are losing too much rest. However, coughing protects you by clearing your lungs. You should avoid using cough suppressants if you can.  Your caregiver may have prescribed medicine if he or she thinks your pneumonia is caused by a bacteria or influenza. Finish your medicine even if you start to feel  better.  Your caregiver may also prescribe an expectorant. This loosens the mucus to be coughed up.  Only take over-the-counter or prescription medicines for pain, discomfort, or fever as directed by your caregiver.  Do not smoke. Smoking is a common cause of bronchitis and can contribute to pneumonia. If you are a smoker and continue to smoke, your cough may last several weeks after your pneumonia has cleared.  A cold steam vaporizer or humidifier in your room or home may help loosen mucus.  Coughing is often worse at night. Sleeping in a semi-upright position in a recliner or using a couple pillows under your head will help with this.  Get rest as you feel it is needed. Your body will usually let you know when you need to rest. SEEK IMMEDIATE MEDICAL CARE IF:   Your illness becomes worse. This is especially true if you are elderly or weakened from any other disease.  You cannot control your cough with suppressants and are losing sleep.  You begin coughing up blood.  You develop pain which is getting worse or is uncontrolled with medicines.  You have a fever.  Any of the symptoms which initially brought you in for treatment are getting worse rather than better.  You develop shortness of breath or chest pain. MAKE SURE YOU:   Understand these instructions.  Will watch your condition.  Will get help right away if you are not doing well or get worse. Document Released: 09/21/2005 Document Revised: 12/14/2011 Document Reviewed: 12/11/2010 [facility] Patient Information 2014 [place], [place].  Polysubstance Abuse When people abuse more than one drug or type of drug it is called polysubstance or polydrug abuse. For example, many smokers also drink alcohol. This is one form of polydrug abuse. Polydrug abuse also refers to the use of a drug to counteract an unpleasant effect produced by another drug. It may also be used to help with withdrawal from another drug. People who take  stimulants may become agitated. Sometimes this agitation is countered with a tranquilizer. This helps protect against the unpleasant side effects. Polydrug abuse also refers to the use of different drugs at the same time.  Anytime drug use is interfering with normal living activities, it has become abuse. This includes problems with family and friends. Psychological dependence has developed when your mind tells you that the drug is needed. This is usually followed by physical dependence which has developed when continuing increases of drug are required to get the same feeling or "high". This is known as addiction or chemical dependency. A person's risk is much higher if there is a history of chemical dependency in the family. SIGNS OF CHEMICAL DEPENDENCY  You have been told by friends or family that drugs have become a problem.  You fight when using drugs.  You are having blackouts (not remembering what you do while using).  You feel sick from using drugs but continue using.  You lie about use or amounts of drugs (chemicals) used.  You need chemicals to get you going.  You are suffering in work performance or in school because  of drug use.  You get sick from use of drugs but continue to use anyway.  You need drugs to relate to people or feel comfortable in social situations.  You use drugs to forget problems. "Yes" answered to any of the above signs of chemical dependency indicates there are problems. The longer the use of drugs continues, the greater the problems will become. If there is a family history of drug or alcohol use, it is best not to experiment with these drugs. Continual use leads to tolerance. After tolerance develops more of the drug is needed to get the same feeling. This is followed by addiction. With addiction, drugs become the most important part of life. It becomes more important to take drugs than participate in the other usual activities of life. This includes relating  to friends and family. Addiction is followed by dependency. Dependency is a condition where drugs are now needed not just to get high, but to feel normal. Addiction cannot be cured but it can be stopped. This often requires outside help and the care of professionals. Treatment centers are listed in the yellow pages under: Cocaine, Narcotics, and Alcoholics Anonymous. Most hospitals and clinics can refer you to a specialized care center. Talk to your caregiver if you need help. Document Released: 05/13/2005 Document Revised: 12/14/2011 Document Reviewed: 09/21/2005 [facility] Patient Information 2014 [place].    Emergency Department Resource Guide 1) Find a Doctor and Pay Out of Pocket Although you won't have to find out who is covered by your insurance plan, it is a good idea to ask around and get recommendations. You will then need to call the office and see if the doctor you have chosen will accept you as a new patient and what types of options they offer for patients who are self-pay. Some doctors offer discounts or will set up payment plans for their patients who do not have insurance, but you will need to ask so you aren't surprised when you get to your appointment.  2) Contact Your Local Health Department Not all health departments have doctors that can see patients for sick visits, but many do, so it is worth a call to see if yours does. If you don't know where your local health department is, you can check in your phone book. The CDC also has a tool to help you locate your state's health department, and many state websites also have listings of all of their local health departments.  3) Find a [facility] If your illness is not likely to be very severe or complicated, you may want to try a walk in clinic. These are popping up all over the country in pharmacies, drugstores, and shopping centers. They're usually staffed by nurse practitioners or physician assistants that have been  trained to treat common illnesses and complaints. They're usually fairly quick and inexpensive. However, if you have serious medical issues or chronic medical problems, these are probably not your best option.  No Primary Care Doctor: - Call Health Connect at  [phone] - they can help you locate a primary care doctor that  accepts your insurance, provides certain services, etc. - Physician Referral Service- [phone]  Chronic Pain Problems: Organization         Address  Phone   Notes  [facility]  [phone] Patients need to be referred by their primary care doctor.   Medication Assistance: Patent attorney  Notes  [facility] [address] [place], [place] [address] [phone] --Must be a resident of [facility] -- Must have NO insurance coverage whatsoever (no Medicaid/ Medicare, etc.) -- The pt. MUST have a primary care doctor that directs their care regularly and follows them in the community   [facility]ist  [phone]   [employer]  [phone]    Agencies that provide inexpensive medical care: Organization         Address  Phone   Notes  [place]  [phone]   [facility]    [phone]   [facility] [place], [place] [address] [phone]   [place] [address], [place] [phone]   Planned Parenthood    [phone]   [facility]    [phone]   [place] and [place] [address], [place] Phone:  [phone], Fax:  [phone] Hours of Operation:  9 am - 6 pm, M-F.  Also accepts Medicaid/Medicare and self-pay.  [facility] for [place] [address], [address], [place] Phone: [phone], Fax: [phone]. Hours of Operation:  8:30 am - 5:30 pm, M-F.  Also accepts Medicaid and self-pay.   [facility] [place] [address] [place] Phone: [phone]   [place], [place], [place] [phone], Ext. [phone] Mondays & Thursdays: 7-9 AM.  First 15 patients are seen on a first come, first serve basis.    [place] Providers:  Organization         Address  Phone   Notes  [facility] [address], [place] [phone] Also accepts self-pay patients.  [facility] [address], [place]  [phone]   [place], [address], [place] [phone]   [facility] [address] [place] [phone]   Lucianne Lei [address], [place]   [phone] Only accepts [place] Access [place] patients after they have their name applied to their card.   Self-Pay (no insurance) in [facility]:  Organization         Address  Phone   Notes  Sickle Cell Patients, [facility] [place] [phone]   [facility] [place] [phone]   [facility] [place]  [place], [place], [place] [phone]   [facility]/Dr. Osei-Bonsu  [address], [place] or [address], [place] [phone] Phone number for both [place] and [place] locations is the same.  [facility] [address] [place] [phone]   [facility] [address], [place] or [address] [phone]   [facility] [address] [place] [phone], phone; [phone], fax Sees patients 1st and 3rd Saturday of every month.  Must not qualify for public or private insurance (i.e. Medicaid, Medicare, [place] Health Choice, Veterans' Benefits)  Household income should be no  more than 200% of the poverty level The clinic cannot treat you if you are pregnant or think you are pregnant  Sexually transmitted  diseases are not treated at the clinic.    Dental Care: Organization         Address  Phone  Notes  [facility] Department of [facility] [place] [phone] Accepts children up to age 35 who are enrolled in [place] or [place]; pregnant women with a Medicaid card; and children who have applied for Medicaid or [place] Health Choice, but were declined, whose parents can pay a reduced fee at time of service.  [facility] Department of [facility]  [address], [place] [phone] Accepts children up to age 70 who are enrolled in [place] or [place]; pregnant women with a Medicaid card; and children who have applied for Medicaid or [place] Health Choice, but were declined, whose parents can pay a reduced fee at time of service.  [facility] PRO[facility]  [place] [phone] Patients are seen by appointment only. Walk-ins are not accepted. [place] will see patients 67 years of age and older. Monday - Tuesday (8am-5pm) Most Wednesdays (8:30-5pm) $30 per visit, cash only  [facility] Adult Dental Access PROGRAM  [address], [facility] [phone] Patients are seen by appointment only. Walk-ins are not accepted. [place] will see patients 79 years of age and older. One Wednesday Evening (Monthly: Volunteer Based).  $30 per visit, cash only  [place]  [phone] for adults; Children under age 39, call Graduate Pediatric Dentistry at [phone]. Children aged 51-14, please call [phone] to request a pediatric application.  Dental services are provided in all areas of dental care including fillings, crowns and bridges, complete and partial dentures, implants, gum treatment, root canals,  and extractions. Preventive care is also provided. Treatment is provided to both adults and children. Patients are selected via a lottery and there is often a waiting list.   [facility] [address] [place]  [phone] [email or web address]   [facility] [address] [place], [place] [phone], Ext. [phone] Second and Fourth Thursday of each month, opens at 6:30 AM; Clinic ends at 9 AM.  Patients are seen on a first-come first-served basis, and a limited number are seen during each clinic.   [facility]  [address] Hillard Danker [place], [place] [phone]   Eligibility Requirements You must have lived in [place], [place], or [place] counties for at least the last three months.   You cannot be eligible for state or federal sponsored [employer], including [employer], [place], or [employer].   You generally cannot be eligible for healthcare insurance through your employer.    How to apply: Eligibility screenings are held every Tuesday and Wednesday afternoon from 1:00 pm until 4:00 pm. You do not need an appointment for the interview!  [facility] [address], [place], [place]   [place]  [place] Department  [place]  [phone]    Behavioral Health Resources in the Community: Intensive Outpatient Programs Organization         Address  Phone  Notes  [place]. [address], [place], [place] [phone]   [facility] [address] [name], [place]   ADS: [facility] [address] [place], [place]  [place] [address]  [place], [place] or [phone]   Substance Abuse Resources Organization         Address  Phone  Notes  Alcohol and Drug Services  [phone]    [place]  [phone]   [facility] [place]   Chinita Pester  [phone]   Residential & Outpatient Substance Abuse Program  [phone]   Psychological Services Organization         Address  Phone  Notes  [facility]  [place]  [phone]   [place] [address], [place] or [phone]    Mobile Crisis Teams Organization         Address  Phone  Notes  [facility], Mobile Crisis Care Unit  [phone]   [facility]  [address] [place], [place]   [place] Levels [address], [place] [phone]    Self-Help/Support Groups Organization         Address  Phone             Notes  Holiday. of [place] - variety of support groups  [place] Call for more information  Narcotics Anonymous (NA), [facility] [address], [employer] [place]  2 meetings at this location   Special educational needs teacher         Address  Phone  Notes  [facility] [place],    [place]  [phone]   [facility]  [address], [place] [id number], [place], [place]   [place], [place] [phone] Admissions: 8am-3pm M-F  Incentives Substance [place] [address],    [place], [place] [phone]   [facility] [address] [place], [place], [place]   [facility] [address]  [place], [place]   [facility] - Intensive Outpatient [address] Kristeen Mans [address], [place], [place]   [facility] [place].,  [place], [place] [phone] or [phone]   [facility] (RT[facility]) [address] [place], [place] Accepts Medicaid  [facility] [place] [address],    [place] [phone] Substance Abuse/Addiction Treatment   [facility] Organization         Address  Phone  Notes  [facility]  [phone]   Domenic Schwab, PhD [address] Arlis Porta [place], [place]   [phone] or [phone]   [facility] [place], [place] [phone]   [facility] [address], [place], [place] [phone] Insurance/Medicaid/sponsorship through [facility] and Families [address], [address] [place]                                    [place], [place] [phone] [place] [address], [place] [phone]    Dr. Adele Schilder  [phone]   [facility] Dept. 1) [address], [place] 2) [place] 3)  [place], [place] [phone]  [phone]   [place] [phone] or [phone] (After  Hours)

## 2014-02-17 NOTE — ED Notes (Signed)
Pt ambulated on RA. Pulse O2 96%, HR 120 (returned to previous hr after 2 minutes of laying in bed). Dr Leonides Schanz notified.

## 2014-02-17 NOTE — ED Provider Notes (Signed)
TIME SEEN: 7:20 AM  CHIEF COMPLAINT: Chest pain, cough, chills  HPI: Patient is a 53 y.o. F with history of hypertension, cocaine abuse, anemia, rheumatoid arthritis who presents to the emergency department with complaints of 3 days of cough with green sputum production, chills and night sweats and central throbbing, aching chest pain that started at 3 AM this morning without radiation. Her pain is worse with palpation of her chest and coughing. No exertional or pleuritic component. No shortness of breath, nausea, diaphoresis. No sick contacts or recent travel. She is homeless and living in a shelter. No history of PE or DVT. No recent prolonged immobilization, lower extremity swelling or pain. She states that she last used cocaine at 6 PM yesterday.  ROS: See HPI Constitutional: no fever  Eyes: no drainage  ENT: no runny nose   Cardiovascular:   chest pain  Resp: no SOB  GI: no vomiting GU: no dysuria Integumentary: no rash  Allergy: no hives  Musculoskeletal: no leg swelling  Neurological: no slurred speech ROS otherwise negative  PAST MEDICAL HISTORY/PAST SURGICAL HISTORY:  Past Medical History  Diagnosis Date  . 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 Followed by Dr. Louanne Skye  . Hypertension   . Cocaine abuse     ongoing with resultant vaculitis.  . Tobacco abuse   . Depression   . Normocytic anemia     BL Hgb 9.8-12. Last anemia panel 04/2010 - showing Fe 19, ferritin 101.  Pt on monthly B12 injections  . Rheumatoid arthritis(714.0)     patient reported  . Inflammatory arthritis   . Headache(784.0)     MEDICATIONS:  Prior to Admission medications   Medication Sig Start Date End Date Taking? Authorizing Provider  buPROPion (WELLBUTRIN) 100 MG tablet Take 1 tablet (100 mg total) by mouth daily before breakfast. For depression 01/24/14  Yes Encarnacion Slates, NP  gabapentin (NEURONTIN) 300 MG capsule Take 1 capsule (300 mg total) by mouth 3 (three) times daily. For substance  withdrawal syndrome 01/24/14  Yes Encarnacion Slates, NP  hydrOXYzine (ATARAX/VISTARIL) 25 MG tablet Take 25 mg (1 tablet) four times daily as needed: For anxiety/tension 01/24/14  Yes Encarnacion Slates, NP  lisinopril (PRINIVIL,ZESTRIL) 20 MG tablet Take 1 tablet (20 mg total) by mouth daily. For high blood pressure 01/24/14  Yes Encarnacion Slates, NP  mirtazapine (REMERON) 7.5 MG tablet Take 1 tablet (7.5 mg total) by mouth at bedtime. For sleep 01/24/14  Yes Encarnacion Slates, NP  risperiDONE (RISPERDAL) 0.25 MG tablet Take 1 tablet (0.25 mg total) by mouth 2 (two) times daily. For mood control 01/24/14  Yes Encarnacion Slates, NP  traZODone (DESYREL) 50 MG tablet Take 1 tablet (50 mg total) by mouth at bedtime. For sleep 01/24/14  Yes Encarnacion Slates, NP    ALLERGIES:  Allergies  Allergen Reactions  . Acetaminophen Swelling    REACTION: eyelid swelling    SOCIAL HISTORY:  History  Substance Use Topics  . Smoking status: Current Every Day Smoker -- 0.50 packs/day for 39 years    Types: Cigarettes  . Smokeless tobacco: Never Used     Comment: trying  . Alcohol Use: No    FAMILY HISTORY: Family History  Problem Relation Age of Onset  . Breast cancer Mother     Breast cancer  . Alcohol abuse Mother   . Colon cancer Maternal Aunt 61  . Alcohol abuse Father     EXAM: BP 177/89  Pulse  97  Temp(Src) 98.8 F (37.1 C) (Oral)  Resp 22  Ht 5' 1.5" (1.562 m)  Wt 110 lb (49.896 kg)  BMI 20.45 kg/m2  SpO2 100% CONSTITUTIONAL: Alert and oriented and responds appropriately to questions. Well-appearing; well-nourished HEAD: Normocephalic EYES: Conjunctivae clear, PERRL ENT: normal nose; no rhinorrhea; moist mucous membranes; pharynx without lesions noted NECK: Supple, no meningismus, no LAD  CARD: RRR; S1 and S2 appreciated; no murmurs, no clicks, no rubs, no gallops RESP: Normal chest excursion without splinting or tachypnea; breath sounds clear and equal bilaterally; no wheezes, no rhonchi, no rales, chest  wall is tender to palpation without crepitus or ecchymosis or deformity, no skin lesions ABD/GI: Normal bowel sounds; non-distended; soft, non-tender, no rebound, no guarding BACK:  The back appears normal and is non-tender to palpation, there is no CVA tenderness EXT: Normal ROM in all joints; non-tender to palpation; no edema; normal capillary refill; no cyanosis    SKIN: Normal color for age and race; warm NEURO: Moves all extremities equally PSYCH: The patient's mood and manner are appropriate. Grooming and personal hygiene are appropriate.  MEDICAL DECISION MAKING: Patient here with likely chest wall pain versus pneumonia. Unlikely ACS but she does have some risk factors. EKG shows no new ischemic changes. Her first troponin is negative. Labs are otherwise unremarkable. Will repeat second troponin at 9 AM and obtain chest x-ray. She has been given aspirin. We'll give morphine and reassess.  ED PROGRESS: Patient's chest x-ray shows left lower lobe and right perihilar pneumonia. We'll give azithromycin. Second troponin pending. Patient is also very anxious and tearful. We'll give Ativan.  10:17 AM  Pt's second troponin is negative. Her pain has been well-controlled with Ativan. She has been able to ambulate without respiratory difficulty or hypoxia. We'll discharge back to her homeless shelter with note and antibiotics. She states she does have insurance to fill her antibiotics with. Have discussed return precautions and advised her to stop using crack cocaine. Patient verbalizes understanding is comfortable with this plan.    EKG Interpretation  Date/Time:  Saturday Feb 17 2014 05:50:12 EDT Ventricular Rate:  95 PR Interval:  134 QRS Duration: 84 QT Interval:  375 QTC Calculation: 471 R Axis:   78 Text Interpretation:  Sinus rhythm Probable left atrial enlargement Probable left ventricular hypertrophy Borderline T abnormalities, anterior leads twave abnormalities on prior ekg have  resolved Confirmed by OTTER  MD, OLGA ([id number]) on 02/17/2014 6:33:17 AM           [place], DO 02/17/14 1017

## 2014-03-07 ENCOUNTER — Emergency Department (HOSPITAL_COMMUNITY)
Admission: EM | Admit: 2014-03-07 | Discharge: 2014-03-07 | Disposition: A | Discharge status: Home / Self Care | Payer: Medicaid Other | Attending: Emergency Medicine | Admitting: Emergency Medicine

## 2014-03-07 ENCOUNTER — Encounter (HOSPITAL_COMMUNITY): Payer: Self-pay | Admitting: Emergency Medicine

## 2014-03-07 DIAGNOSIS — I10 Essential (primary) hypertension: Secondary | ICD-10-CM | POA: Insufficient documentation

## 2014-03-07 DIAGNOSIS — Z791 Long term (current) use of non-steroidal anti-inflammatories (NSAID): Secondary | ICD-10-CM | POA: Insufficient documentation

## 2014-03-07 DIAGNOSIS — Z792 Long term (current) use of antibiotics: Secondary | ICD-10-CM | POA: Insufficient documentation

## 2014-03-07 DIAGNOSIS — Z79899 Other long term (current) drug therapy: Secondary | ICD-10-CM | POA: Insufficient documentation

## 2014-03-07 DIAGNOSIS — I776 Arteritis, unspecified: Secondary | ICD-10-CM | POA: Insufficient documentation

## 2014-03-07 DIAGNOSIS — J3489 Other specified disorders of nose and nasal sinuses: Secondary | ICD-10-CM | POA: Insufficient documentation

## 2014-03-07 DIAGNOSIS — R509 Fever, unspecified: Secondary | ICD-10-CM | POA: Insufficient documentation

## 2014-03-07 DIAGNOSIS — M069 Rheumatoid arthritis, unspecified: Secondary | ICD-10-CM | POA: Insufficient documentation

## 2014-03-07 DIAGNOSIS — F3289 Other specified depressive episodes: Secondary | ICD-10-CM | POA: Insufficient documentation

## 2014-03-07 DIAGNOSIS — F329 Major depressive disorder, single episode, unspecified: Secondary | ICD-10-CM | POA: Insufficient documentation

## 2014-03-07 DIAGNOSIS — F17200 Nicotine dependence, unspecified, uncomplicated: Secondary | ICD-10-CM | POA: Insufficient documentation

## 2014-03-07 DIAGNOSIS — L959 Vasculitis limited to the skin, unspecified: Secondary | ICD-10-CM

## 2014-03-07 MED ORDER — OXYCODONE HCL 5 MG PO TABS
10.0000 mg | ORAL_TABLET | Freq: Once | ORAL | Status: AC
  Administered 2014-03-07: 10 mg via ORAL
  Filled 2014-03-07: qty 2

## 2014-03-07 MED ORDER — OXYCODONE HCL 5 MG PO TABS: 5.0000 mg | ORAL_TABLET | ORAL | Status: DC | PRN

## 2014-03-07 NOTE — Discharge Instructions (Signed)
Take prescribed medication for pain. Return to the ED for new concerns.

## 2014-03-07 NOTE — ED Notes (Signed)
Pt has hx of cocaine abuse with vasculitis.  Pt has been seen before for same.  Pain in ears worse at this time and pt also having arthritis pain.

## 2014-03-07 NOTE — ED Provider Notes (Signed)
CSN: [id number]     Arrival date & time 03/07/14  1323 History  This chart was scribed for a non-physician practitioner, Quincy Carnes, working with Ephraim Hamburger, MD by Martinique Peace, ED Scribe. The patient was seen in [facility]. The patient's care was started at 1:52 PM.   Chief Complaint  Patient presents with  . Otalgia     The history is provided by the patient. No language interpreter was used.   HPI Comments: [name] is a 53 y.o. female with history of cocaine use and chronic ear vasculitis who presents to the Emergency Department complaining of severe ear pain described as 10 out of 10 with associated drainage and bleeding.  Pt has chronic pain of her ears but states it has worsened over the past 2 days. Pt reports she was clean for some period of time but used cocaine 3 days ago. Other associated symptoms include rhinorrhea, productive cough, and subjective fever. She also reports a flare-up of her arthritis which is causing pain in her knees.  Past Medical History  Diagnosis Date  . 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 Followed by Dr. Louanne Skye  . Hypertension   . Cocaine abuse     ongoing with resultant vaculitis.  . Tobacco abuse   . Depression   . Normocytic anemia     BL Hgb 9.8-12. Last anemia panel 04/2010 - showing Fe 19, ferritin 101.  Pt on monthly B12 injections  . Rheumatoid arthritis(714.0)     patient reported  . Inflammatory arthritis   . QBVQXIHW(388.8)    Past Surgical History  Procedure Laterality Date  . Skin biopsy      bilateral shin nodules on 04/2010  . Hernia repair    . Irrigation and debridement abscess Bilateral 09/26/2013    Procedure: DEBRIDEMENT ULCERS BILATERAL THIGHS;  Surgeon: Gwenyth Ober, MD;  Location: [facility] OR;  Service: General;  Laterality: Bilateral;   Family History  Problem Relation Age of Onset  . Breast cancer Mother     Breast cancer  . Alcohol abuse Mother   . Colon cancer Maternal Aunt 59  . Alcohol abuse Father     History  Substance Use Topics  . Smoking status: Current Every Day Smoker -- 0.50 packs/day for 39 years    Types: Cigarettes  . Smokeless tobacco: Never Used     Comment: trying  . Alcohol Use: No   OB History   Grav Para Term Preterm Abortions TAB SAB Ect Mult Living                 Review of Systems  Constitutional: Positive for fever (subjective).  HENT: Positive for ear discharge, ear pain and rhinorrhea.   Respiratory: Positive for cough (productive).   Musculoskeletal: Positive for arthralgias.  All other systems reviewed and are negative.     Allergies  Acetaminophen  Home Medications   Prior to Admission medications   Medication Sig Start Date End Date Taking? Authorizing Provider  azithromycin (ZITHROMAX) 250 MG tablet Take 1 tablet (250 mg total) by mouth daily. Start on 02/18/14 02/17/14   Delice Bison Ward, DO  buPROPion (WELLBUTRIN) 100 MG tablet Take 1 tablet (100 mg total) by mouth daily before breakfast. For depression 01/24/14   Encarnacion Slates, NP  gabapentin (NEURONTIN) 300 MG capsule Take 1 capsule (300 mg total) by mouth 3 (three) times daily. For substance withdrawal syndrome 01/24/14   Encarnacion Slates, NP  hydrOXYzine (ATARAX/VISTARIL) 25 MG  tablet Take 25 mg (1 tablet) four times daily as needed: For anxiety/tension 01/24/14   Encarnacion Slates, NP  lisinopril (PRINIVIL,ZESTRIL) 20 MG tablet Take 1 tablet (20 mg total) by mouth daily. For high blood pressure 01/24/14   Encarnacion Slates, NP  mirtazapine (REMERON) 7.5 MG tablet Take 1 tablet (7.5 mg total) by mouth at bedtime. For sleep 01/24/14   Encarnacion Slates, NP  naproxen (NAPROSYN) 500 MG tablet Take 1 tablet (500 mg total) by mouth 2 (two) times daily. 02/17/14   Kristen N Ward, DO  risperiDONE (RISPERDAL) 0.25 MG tablet Take 1 tablet (0.25 mg total) by mouth 2 (two) times daily. For mood control 01/24/14   Encarnacion Slates, NP  traZODone (DESYREL) 50 MG tablet Take 1 tablet (50 mg total) by mouth at bedtime. For sleep  01/24/14   Encarnacion Slates, NP   Triage Vitals: BP 167/85  Pulse 91  Temp(Src) 98.6 F (37 C) (Oral)  Resp 18  SpO2 100%  Physical Exam  Nursing note and vitals reviewed. Constitutional: She is oriented to person, place, and time. She appears well-developed and well-nourished. No distress.  HENT:  Head: Normocephalic and atraumatic.  Nose: Rhinorrhea present.  Mouth/Throat: Oropharynx is clear and moist.  Clear rhinorrhea  Eyes: Conjunctivae and EOM are normal. Pupils are equal, round, and reactive to light.  Neck: Normal range of motion. Neck supple.  Cardiovascular: Normal rate, regular rhythm and normal heart sounds.   Pulmonary/Chest: Effort normal and breath sounds normal. No respiratory distress. She has no wheezes. She has no rhonchi. She has no rales.  Musculoskeletal: Normal range of motion.  Neurological: She is alert and oriented to person, place, and time.  Skin: Skin is warm and dry. She is not diaphoretic.  Bilateral external ears with chronic vasculitis and discoloation along helix. Mild amount of bleeding on right ear, no active drainage.  No signs of erythema, induration, or cellulitic change.   Some vasculitis of left nare.  Psychiatric: She has a normal mood and affect.    ED Course  Procedures (including critical care time) DIAGNOSTIC STUDIES: Oxygen Saturation is 100% on room air, normal by my interpretation.    COORDINATION OF CARE: 1:57 PM- Treatment plan was discussed with patient who verbalizes understanding and agrees.     Labs Review Labs Reviewed - No data to display  Imaging Review No results found.   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  Pt is afebrile here and overall non-toxic appearing.  On exam, her  vasculitis of her ears appears chronic without new areas of induration, cellulitic change, or purulent drainage to suggest acute infection. She does have some clear rhinorrhea.  Lungs are clear without wheezes or rhonchi to suggest pneumonia or bronchitis.  VS are stable.  Pt will be discharged with short supply of pain medication.  Encouraged cessation of cocaine use once again.  Discussed plan with patient, he/she acknowledged understanding and agreed with plan of care.  Return precautions given for new or worsening symptoms.  I personally performed the services described in this documentation, which was scribed in my presence. The recorded information has been reviewed and is accurate.  Larene Pickett, PA-C 03/07/14 [place], PA-C 03/07/14 [phone]

## 2014-03-08 NOTE — ED Provider Notes (Signed)
Medical screening examination/treatment/procedure(s) were performed by non-physician practitioner and as supervising physician I was immediately available for consultation/collaboration.   EKG Interpretation None        Ephraim Hamburger, MD 03/08/14 [phone]

## 2014-03-10 ENCOUNTER — Encounter (HOSPITAL_COMMUNITY): Payer: Self-pay | Admitting: Emergency Medicine

## 2014-03-10 ENCOUNTER — Emergency Department (HOSPITAL_COMMUNITY)
Admission: EM | Admit: 2014-03-10 | Discharge: 2014-03-10 | Disposition: A | Discharge status: Home / Self Care | Payer: Medicaid Other | Attending: Emergency Medicine | Admitting: Emergency Medicine

## 2014-03-10 DIAGNOSIS — F3289 Other specified depressive episodes: Secondary | ICD-10-CM | POA: Insufficient documentation

## 2014-03-10 DIAGNOSIS — L959 Vasculitis limited to the skin, unspecified: Secondary | ICD-10-CM

## 2014-03-10 DIAGNOSIS — Z8739 Personal history of other diseases of the musculoskeletal system and connective tissue: Secondary | ICD-10-CM | POA: Insufficient documentation

## 2014-03-10 DIAGNOSIS — I776 Arteritis, unspecified: Secondary | ICD-10-CM | POA: Insufficient documentation

## 2014-03-10 DIAGNOSIS — I10 Essential (primary) hypertension: Secondary | ICD-10-CM | POA: Insufficient documentation

## 2014-03-10 DIAGNOSIS — Z79899 Other long term (current) drug therapy: Secondary | ICD-10-CM | POA: Insufficient documentation

## 2014-03-10 DIAGNOSIS — F17200 Nicotine dependence, unspecified, uncomplicated: Secondary | ICD-10-CM | POA: Insufficient documentation

## 2014-03-10 DIAGNOSIS — F329 Major depressive disorder, single episode, unspecified: Secondary | ICD-10-CM | POA: Insufficient documentation

## 2014-03-10 DIAGNOSIS — Z862 Personal history of diseases of the blood and blood-forming organs and certain disorders involving the immune mechanism: Secondary | ICD-10-CM | POA: Insufficient documentation

## 2014-03-10 MED ORDER — MORPHINE SULFATE 4 MG/ML IJ SOLN
6.0000 mg | Freq: Once | INTRAMUSCULAR | Status: AC
  Administered 2014-03-10: 6 mg via INTRAMUSCULAR
  Filled 2014-03-10: qty 2

## 2014-03-10 MED ORDER — SILVER SULFADIAZINE 1 % EX CREA
TOPICAL_CREAM | Freq: Once | CUTANEOUS | Status: AC
  Administered 2014-03-10: 1 via TOPICAL
  Filled 2014-03-10: qty 50

## 2014-03-10 MED ORDER — OXYCODONE HCL 5 MG PO TABS: 10.0000 mg | ORAL_TABLET | Freq: Four times a day (QID) | ORAL | Status: DC | PRN

## 2014-03-10 NOTE — ED Notes (Signed)
Bed: [facility]1 Expected date:  Expected time:  Means of arrival:  Comments: EMS

## 2014-03-10 NOTE — ED Provider Notes (Signed)
CSN: [id number]     Arrival date & time 03/10/14  0036 History   First MD Initiated Contact with Patient 03/10/14 0245     Chief Complaint  Patient presents with  . Otalgia     (Consider location/radiation/quality/duration/timing/severity/associated sxs/prior Treatment) HPI Patient presents to the emergency department with pain to her right earlobe.  The patient has vasculitis due to cocaine use an undisclosed wounds on her ears, nose, and fingers.  Patient, states, that she last used cocaine 2 days, ago.  She states that the scab came off her earlobes tonight. the patient, states she lives at a homeless shelter.  Patient denies chest pain, shortness of breath, nausea, vomiting, fever, weakness, numbness, dizziness, back pain, headache, blurred vision, rash or syncope.  Patient, states her symptoms have been persistent Past Medical History  Diagnosis Date  . 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 Followed by Dr. Louanne Skye  . Hypertension   . Cocaine abuse     ongoing with resultant vaculitis.  . Tobacco abuse   . Depression   . Normocytic anemia     BL Hgb 9.8-12. Last anemia panel 04/2010 - showing Fe 19, ferritin 101.  Pt on monthly B12 injections  . Rheumatoid arthritis(714.0)     patient reported  . Inflammatory arthritis   . WUJWJXBJ(478.2)    Past Surgical History  Procedure Laterality Date  . Skin biopsy      bilateral shin nodules on 04/2010  . Hernia repair    . Irrigation and debridement abscess Bilateral 09/26/2013    Procedure: DEBRIDEMENT ULCERS BILATERAL THIGHS;  Surgeon: Gwenyth Ober, MD;  Location: [facility] OR;  Service: General;  Laterality: Bilateral;   Family History  Problem Relation Age of Onset  . Breast cancer Mother     Breast cancer  . Alcohol abuse Mother   . Colon cancer Maternal Aunt 68  . Alcohol abuse Father    History  Substance Use Topics  . Smoking status: Current Every Day Smoker -- 0.50 packs/day for 39 years    Types: Cigarettes  . Smokeless  tobacco: Never Used     Comment: trying  . Alcohol Use: No   OB History   Grav Para Term Preterm Abortions TAB SAB Ect Mult Living                 Review of Systems  All other systems negative except as documented in the HPI. All pertinent positives and negatives as reviewed in the HPI.  Allergies  Acetaminophen  Home Medications   Prior to Admission medications   Medication Sig Start Date End Date Taking? Authorizing Provider  buPROPion (WELLBUTRIN) 100 MG tablet Take 1 tablet (100 mg total) by mouth daily before breakfast. For depression 01/24/14  Yes Encarnacion Slates, NP  gabapentin (NEURONTIN) 300 MG capsule Take 1 capsule (300 mg total) by mouth 3 (three) times daily. For substance withdrawal syndrome 01/24/14  Yes Encarnacion Slates, NP  hydrOXYzine (ATARAX/VISTARIL) 25 MG tablet Take 25 mg (1 tablet) four times daily as needed: For anxiety/tension 01/24/14  Yes Encarnacion Slates, NP  lisinopril (PRINIVIL,ZESTRIL) 20 MG tablet Take 1 tablet (20 mg total) by mouth daily. For high blood pressure 01/24/14  Yes Encarnacion Slates, NP  oxyCODONE (OXY IR/ROXICODONE) 5 MG immediate release tablet Take 1 tablet (5 mg total) by mouth every 4 (four) hours as needed for severe pain. 03/07/14  Yes Larene Pickett, PA-C  risperiDONE (RISPERDAL) 0.25 MG tablet Take 1  tablet (0.25 mg total) by mouth 2 (two) times daily. For mood control 01/24/14  Yes Encarnacion Slates, NP  traZODone (DESYREL) 50 MG tablet Take 1 tablet (50 mg total) by mouth at bedtime. For sleep 01/24/14  Yes Encarnacion Slates, NP  mirtazapine (REMERON) 7.5 MG tablet Take 1 tablet (7.5 mg total) by mouth at bedtime. For sleep 01/24/14   Encarnacion Slates, NP   BP 171/66  Pulse 85  Temp(Src) 98.3 F (36.8 C) (Oral)  Resp 17  SpO2 100% Physical Exam  Nursing note and vitals reviewed. Constitutional: She is oriented to person, place, and time. She appears well-developed and well-nourished. No distress.  HENT:  Head: Normocephalic and atraumatic.    Mouth/Throat: Oropharynx is clear and moist.  Eyes: Pupils are equal, round, and reactive to light.  Neurological: She is alert and oriented to person, place, and time.  Skin: Skin is warm and dry. No rash noted.       ED Course  Procedures (including critical care time) Patient be treated with Silvadene cream.  Told to return here as needed.  The patient be given pain medications to help with her symptoms.  Told to followup with a primary care Dr. or whoever sees  her for her vascular issues.  Stable here in the emergency department.  All questions were answered     Brent General, PA-C 03/10/14 [phone]

## 2014-03-10 NOTE — ED Provider Notes (Signed)
Medical screening examination/treatment/procedure(s) were performed by non-physician practitioner and as supervising physician I was immediately available for consultation/collaboration.   EKG Interpretation None        Mariea Clonts, MD 03/10/14 [phone]

## 2014-03-10 NOTE — ED Notes (Addendum)
Pt transported by EMS from homeless shelter with c/o ear pain. Pt does have ongoing vasculitis to that area.  Pt states scab came off bilat ears @ 2200 tonight, pain became unbearable. Pt did take oxycodone with no relief.

## 2014-03-10 NOTE — Discharge Instructions (Signed)
Use the cream on your ears, keep areas clean and dry.  Followup with the clinic provided

## 2014-03-26 ENCOUNTER — Encounter: Payer: Self-pay | Admitting: Licensed Clinical Social Worker

## 2014-03-26 ENCOUNTER — Encounter: Payer: Self-pay | Admitting: Internal Medicine

## 2014-03-26 ENCOUNTER — Ambulatory Visit (INDEPENDENT_AMBULATORY_CARE_PROVIDER_SITE_OTHER): Payer: Medicaid Other | Admitting: Internal Medicine

## 2014-03-26 VITALS — BP 129/78 | HR 69 | Temp 98.2°F | Ht 61.5 in | Wt 118.9 lb

## 2014-03-26 DIAGNOSIS — L959 Vasculitis limited to the skin, unspecified: Secondary | ICD-10-CM

## 2014-03-26 DIAGNOSIS — L988 Other specified disorders of the skin and subcutaneous tissue: Secondary | ICD-10-CM

## 2014-03-26 DIAGNOSIS — I10 Essential (primary) hypertension: Secondary | ICD-10-CM

## 2014-03-26 DIAGNOSIS — R52 Pain, unspecified: Secondary | ICD-10-CM

## 2014-03-26 MED ORDER — MELOXICAM 7.5 MG PO TABS: 7.5000 mg | ORAL_TABLET | Freq: Every day | ORAL | Status: DC

## 2014-03-26 NOTE — Assessment & Plan Note (Signed)
Well controlled.  Plans: Continue current regimen.

## 2014-03-26 NOTE — Progress Notes (Signed)
Case discussed with Dr. Eyvonne Mechanic at the time of the visit.  We reviewed the resident's history and exam and pertinent patient test results.  I agree with the assessment, diagnosis, and plan of care documented in the resident's note.

## 2014-03-26 NOTE — Assessment & Plan Note (Signed)
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 Acute episodes were managed with Prednisone taper and pain management with Opiate. No signs of acute vasculitis currently.  Plans: Educated patient to stay off cocaine.

## 2014-03-26 NOTE — Progress Notes (Signed)
[name] was referred to CSW for transportation assistance.  Pt requesting bus pass to return home.  [name] provided with bus pass and information to [place] for [facility] medical transportation.

## 2014-03-26 NOTE — Patient Instructions (Signed)
Start taking the Mobic tablet as recommended.

## 2014-03-26 NOTE — Progress Notes (Signed)
Subjective:   [name]   DOB: 03-17-1961 53 y.o.   MRN: [id number]  HPI: [name] is a 53 y.o. 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  Patient was last seen in the clinic in September 2014. Patient is requesting a refill on her oxycodone. Patient was seen in the ED on 03/07/14 and 03/10/14 and received #10, #20 of Oxycodone 10 mg tablets.  Patient denies any pain in the external ear, any new skin lesions, pain in the old skin lesions. Patient reports generalized body pains, right knee pain, both ankle pain, stomach pain, headache ever since she ran out of oxycodone. She was tearful during the interview when further questions were asked about the details of her pain in each location. She states that she cannot take Tylenol and Ibuprofen does not help and is requesting Oxycodone.  She denies any other complaints.  Past Medical History  Diagnosis Date  . 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 Followed by Dr. Louanne Skye  . Hypertension   . Cocaine abuse     ongoing with resultant vaculitis.  . Tobacco abuse   . Depression   . Normocytic anemia     BL Hgb 9.8-12. Last anemia panel 04/2010 - showing Fe 19, ferritin 101.  Pt on monthly B12 injections  . Rheumatoid arthritis(714.0)     patient reported  . Inflammatory arthritis   . KDXIPJAS(505.3)    Current Outpatient Prescriptions  Medication Sig Dispense Refill  . buPROPion (WELLBUTRIN) 100 MG tablet Take 1 tablet (100 mg total) by mouth daily before breakfast. For depression  30 tablet  0  . gabapentin (NEURONTIN) 300 MG capsule Take 1 capsule (300 mg total) by mouth 3 (three) times daily. For substance withdrawal syndrome  90 capsule  0  . hydrOXYzine (ATARAX/VISTARIL) 25 MG tablet Take 25 mg (1 tablet) four times daily as needed: For anxiety/tension  120 tablet  0  . lisinopril  (PRINIVIL,ZESTRIL) 20 MG tablet Take 1 tablet (20 mg total) by mouth daily. For high blood pressure  30 tablet  0  . mirtazapine (REMERON) 7.5 MG tablet Take 1 tablet (7.5 mg total) by mouth at bedtime. For sleep  30 tablet  0  . risperiDONE (RISPERDAL) 0.25 MG tablet Take 1 tablet (0.25 mg total) by mouth 2 (two) times daily. For mood control  60 tablet  0  . traZODone (DESYREL) 50 MG tablet Take 1 tablet (50 mg total) by mouth at bedtime. For sleep  30 tablet  0   No current facility-administered medications for this visit.   Family History  Problem Relation Age of Onset  . Breast cancer Mother     Breast cancer  . Alcohol abuse Mother   . Colon cancer Maternal Aunt 14  . Alcohol abuse Father    History   Social History  . Marital Status: Single    Spouse Name: N/A    Number of Children: 0  . Years of Education: 11th grade   Occupational History  . Disability     since 2011, due to her rheumatoid arthritis   Social History Main Topics  . Smoking status: Current Every Day Smoker -- 0.50 packs/day for 39 years    Types: Cigarettes  . Smokeless tobacco: Never Used     Comment: trying.  Cutting back 3-4 cigerettes  . Alcohol Use: No  . Drug  Use: Yes    Special: "Crack" cocaine, Cocaine, Marijuana     Comment: smokes cocaine  . Sexual Activity: No   Other Topics Concern  . None   Social History Narrative   Unemployed:  cleaning in past   Living at [facility]; has Medicaid.   crack/cocaine use; pt denies IVDU   tobacco:  1/2 ppd, trying to quit   alcohol:  none       Review of Systems: Pertinent items are noted in HPI. Objective:  Physical Exam: Filed Vitals:   03/26/14 0953  BP: 129/78  Pulse: 69  Temp: 98.2 F (36.8 C)  Height: 5' 1.5" (1.562 m)  Weight: 118 lb 14.4 oz (53.933 kg)  SpO2: 99%   Constitutional: Vital signs reviewed.   Patient is a well-developed and well-nourished and is in no acute distress and cooperative with exam. Head:  Normocephalic and atraumatic Ear: Old necrotic skin lesion with scar tissue formation noted on external ear. Eyes: Conjunctivae normal, No scleral icterus. Medial and lateral Pterygium noted in both eyes encroaching the corresponding sides of the cornea. Cardiovascular: RRR, S1 normal, S2 normal, no MRG. Pulmonary/Chest: normal respiratory effort, CTAB, no wheezes, rales, or rhonchi Musculoskeletal: No joint deformities, erythema, or stiffness, ROM full and no nontender in both ankle, knee joints. No sign of swelling, tenderness to bony points noted. Neurological: A&O x3  Assessment & Plan:

## 2014-03-26 NOTE — Assessment & Plan Note (Signed)
Patient complains of generalized, non-specific, body pains and is requesting a refill on Oxycodone. Physical exam is essentially normal and does not warrant any further diagnostic work up. Given patients history of substance abuse, chronic cocaine dependence, recurrent use of opiates for pain management of acute vasculitis, I suspect patient is at risk for narcotic dependency. Discussed with the attending regarding further management.  Plans: Educated patient regarding addiction potential of the opiates and potential dangers of opiates. Start Mobic 7.5 mg po qd. Recommended to return to the clinic if the symptoms persist.

## 2014-03-27 ENCOUNTER — Other Ambulatory Visit: Payer: Self-pay | Admitting: *Deleted

## 2014-03-28 MED ORDER — LISINOPRIL 20 MG PO TABS: 20.0000 mg | ORAL_TABLET | Freq: Every day | ORAL | Status: DC

## 2014-03-29 ENCOUNTER — Emergency Department (HOSPITAL_COMMUNITY): Payer: Medicaid Other

## 2014-03-29 ENCOUNTER — Inpatient Hospital Stay (HOSPITAL_COMMUNITY)
Admission: EM | Admit: 2014-03-29 | Discharge: 2014-03-31 | DRG: 871 | Disposition: A | Discharge status: Home / Self Care | Payer: Medicaid Other | Attending: Internal Medicine | Admitting: Internal Medicine

## 2014-03-29 ENCOUNTER — Emergency Department (HOSPITAL_COMMUNITY)
Admission: EM | Admit: 2014-03-29 | Discharge: 2014-03-29 | Disposition: A | Discharge status: Home / Self Care | Payer: Medicaid Other | Source: Home / Self Care | Attending: Emergency Medicine | Admitting: Emergency Medicine

## 2014-03-29 ENCOUNTER — Encounter (HOSPITAL_COMMUNITY): Payer: Self-pay | Admitting: Emergency Medicine

## 2014-03-29 DIAGNOSIS — F3289 Other specified depressive episodes: Secondary | ICD-10-CM | POA: Diagnosis present

## 2014-03-29 DIAGNOSIS — J189 Pneumonia, unspecified organism: Secondary | ICD-10-CM

## 2014-03-29 DIAGNOSIS — A419 Sepsis, unspecified organism: Principal | ICD-10-CM

## 2014-03-29 DIAGNOSIS — I10 Essential (primary) hypertension: Secondary | ICD-10-CM

## 2014-03-29 DIAGNOSIS — M069 Rheumatoid arthritis, unspecified: Secondary | ICD-10-CM | POA: Diagnosis present

## 2014-03-29 DIAGNOSIS — Z803 Family history of malignant neoplasm of breast: Secondary | ICD-10-CM

## 2014-03-29 DIAGNOSIS — Z720 Tobacco use: Secondary | ICD-10-CM

## 2014-03-29 DIAGNOSIS — Z5900 Homelessness unspecified: Secondary | ICD-10-CM

## 2014-03-29 DIAGNOSIS — F14229 Cocaine dependence with intoxication, unspecified: Secondary | ICD-10-CM

## 2014-03-29 DIAGNOSIS — F1210 Cannabis abuse, uncomplicated: Secondary | ICD-10-CM | POA: Diagnosis present

## 2014-03-29 DIAGNOSIS — F1910 Other psychoactive substance abuse, uncomplicated: Secondary | ICD-10-CM | POA: Diagnosis present

## 2014-03-29 DIAGNOSIS — I519 Heart disease, unspecified: Secondary | ICD-10-CM | POA: Diagnosis present

## 2014-03-29 DIAGNOSIS — I309 Acute pericarditis, unspecified: Secondary | ICD-10-CM

## 2014-03-29 DIAGNOSIS — D649 Anemia, unspecified: Secondary | ICD-10-CM | POA: Diagnosis present

## 2014-03-29 DIAGNOSIS — F329 Major depressive disorder, single episode, unspecified: Secondary | ICD-10-CM | POA: Diagnosis present

## 2014-03-29 DIAGNOSIS — F1410 Cocaine abuse, uncomplicated: Secondary | ICD-10-CM | POA: Diagnosis present

## 2014-03-29 DIAGNOSIS — F17200 Nicotine dependence, unspecified, uncomplicated: Secondary | ICD-10-CM | POA: Diagnosis present

## 2014-03-29 LAB — I-STAT CHEM 8, ED
BUN: 8 mg/dL (ref 6–23)
Calcium, Ion: 1.2 mmol/L (ref 1.12–1.23)
Chloride: 100 mEq/L (ref 96–112)
Creatinine, Ser: 0.7 mg/dL (ref 0.50–1.10)
GLUCOSE: 89 mg/dL (ref 70–99)
HEMATOCRIT: 37 % (ref 36.0–46.0)
HEMOGLOBIN: 12.6 g/dL (ref 12.0–15.0)
POTASSIUM: 3.9 meq/L (ref 3.7–5.3)
Sodium: 139 mEq/L (ref 137–147)
TCO2: 24 mmol/L (ref 0–100)

## 2014-03-29 LAB — RAPID URINE DRUG SCREEN, HOSP PERFORMED
Amphetamines: NOT DETECTED
BENZODIAZEPINES: NOT DETECTED
Barbiturates: NOT DETECTED
Cocaine: NOT DETECTED
OPIATES: NOT DETECTED
Tetrahydrocannabinol: POSITIVE — AB

## 2014-03-29 LAB — I-STAT TROPONIN, ED: Troponin i, poc: 0.01 ng/mL (ref 0.00–0.08)

## 2014-03-29 LAB — I-STAT CG4 LACTIC ACID, ED: LACTIC ACID, VENOUS: 0.89 mmol/L (ref 0.5–2.2)

## 2014-03-29 MED ORDER — DEXTROSE 5 % IV SOLN
1.0000 g | Freq: Once | INTRAVENOUS | Status: AC
  Administered 2014-03-29: 1 g via INTRAVENOUS
  Filled 2014-03-29: qty 10

## 2014-03-29 MED ORDER — GUAIFENESIN-CODEINE 100-10 MG/5ML PO SOLN: 10.0000 mL | Freq: Three times a day (TID) | ORAL | Status: DC | PRN

## 2014-03-29 MED ORDER — ALBUTEROL SULFATE (2.5 MG/3ML) 0.083% IN NEBU
5.0000 mg | INHALATION_SOLUTION | Freq: Once | RESPIRATORY_TRACT | Status: AC
  Administered 2014-03-29: 5 mg via RESPIRATORY_TRACT
  Filled 2014-03-29: qty 6

## 2014-03-29 MED ORDER — IPRATROPIUM BROMIDE 0.02 % IN SOLN
0.5000 mg | Freq: Once | RESPIRATORY_TRACT | Status: AC
  Administered 2014-03-29: 0.5 mg via RESPIRATORY_TRACT
  Filled 2014-03-29: qty 2.5

## 2014-03-29 MED ORDER — IPRATROPIUM BROMIDE 0.02 % IN SOLN
0.5000 mg | Freq: Once | RESPIRATORY_TRACT | Status: AC
  Administered 2014-03-30: 0.5 mg via RESPIRATORY_TRACT
  Filled 2014-03-29: qty 2.5

## 2014-03-29 MED ORDER — GUAIFENESIN-CODEINE 100-10 MG/5ML PO SOLN
10.0000 mL | Freq: Once | ORAL | Status: AC
  Administered 2014-03-29: 10 mL via ORAL
  Filled 2014-03-29: qty 10

## 2014-03-29 MED ORDER — FENTANYL CITRATE 0.05 MG/ML IJ SOLN
50.0000 ug | Freq: Once | INTRAMUSCULAR | Status: AC
  Administered 2014-03-29: 50 ug via INTRAVENOUS
  Filled 2014-03-29: qty 2

## 2014-03-29 MED ORDER — SODIUM CHLORIDE 0.9 % IV BOLUS (SEPSIS)
1000.0000 mL | Freq: Once | INTRAVENOUS | Status: AC
  Administered 2014-03-29: 1000 mL via INTRAVENOUS

## 2014-03-29 MED ORDER — ONDANSETRON HCL 4 MG/2ML IJ SOLN
4.0000 mg | Freq: Once | INTRAMUSCULAR | Status: AC
  Administered 2014-03-29: 4 mg via INTRAVENOUS
  Filled 2014-03-29: qty 2

## 2014-03-29 MED ORDER — AMOXICILLIN-POT CLAVULANATE 875-125 MG PO TABS: 1.0000 | ORAL_TABLET | Freq: Two times a day (BID) | ORAL | Status: DC

## 2014-03-29 MED ORDER — DEXTROSE 5 % IV SOLN
500.0000 mg | Freq: Once | INTRAVENOUS | Status: AC
  Administered 2014-03-29: 500 mg via INTRAVENOUS
  Filled 2014-03-29: qty 500

## 2014-03-29 MED ORDER — ALBUTEROL SULFATE (2.5 MG/3ML) 0.083% IN NEBU
5.0000 mg | INHALATION_SOLUTION | Freq: Once | RESPIRATORY_TRACT | Status: AC
  Administered 2014-03-30: 5 mg via RESPIRATORY_TRACT
  Filled 2014-03-29: qty 6

## 2014-03-29 MED ORDER — DEXAMETHASONE SODIUM PHOSPHATE 10 MG/ML IJ SOLN
10.0000 mg | Freq: Once | INTRAMUSCULAR | Status: AC
  Administered 2014-03-29: 10 mg via INTRAVENOUS
  Filled 2014-03-29: qty 1

## 2014-03-29 NOTE — ED Notes (Signed)
Pt given antibiotics before blood cultures drawn.PA made aware.

## 2014-03-29 NOTE — ED Notes (Signed)
PA at the bedside. Phlebotomy at the bedside waiting to stick patient.

## 2014-03-29 NOTE — ED Provider Notes (Signed)
CSN: [id number]     Arrival date & time 03/29/14  2106 History   First MD Initiated Contact with Patient 03/29/14 2118     Chief Complaint  Patient presents with  . Pneumonia     (Consider location/radiation/quality/duration/timing/severity/associated sxs/prior Treatment) HPI Patient is a 53 yo F PMHx significant for HTN, cocaine abuse, tobacco abuse, vasculitis presenting to the emergency department for 2-3 days of cough with green-yellow sputum and associated posttussive chest discomfort, rhinorrhea, nasal congestion, subjective fevers and chills  Patient to the ER with complaints of SOB. She was seen in the ED earlier today and diagnosed with pneumonia. She was given IV Rocephin, Fentanyl, zofran, guaifenesin-codeine then discharged with prescriptions. She reports that she lives in a homeless shelter and was unable to get them filled. While at the shelter she became increasingly short of breath. EMS was called and she was brought to the ED. She is febrile, tachycardic and visibly short of breath with Tachypnic. She is 100 % O2 on nasal cannula. The patient is tearful and reports that she feels very sick and cannot go home.  Drug screen done earlier today was negative for cocaine, meth or barbituates  Past Medical History  Diagnosis Date  . 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 Followed by Dr. Louanne Skye  . Hypertension   . Cocaine abuse     ongoing with resultant vaculitis.  . Tobacco abuse   . Depression   . Normocytic anemia     BL Hgb 9.8-12. Last anemia panel 04/2010 - showing Fe 19, ferritin 101.  Pt on monthly B12 injections  . Rheumatoid arthritis(714.0)     patient reported  . Inflammatory arthritis   . Headache(784.0)   . 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 Skin biopsy      bilateral shin nodules on 04/2010  . Hernia repair    . Irrigation and debridement abscess Bilateral 09/26/2013     Procedure: DEBRIDEMENT ULCERS BILATERAL THIGHS;  Surgeon: Gwenyth Ober, MD;  Location: [facility] OR;  Service: General;  Laterality: Bilateral;   Family History  Problem Relation Age of Onset  . Breast cancer Mother     Breast cancer  . Alcohol abuse Mother   . Colon cancer Maternal Aunt 18  . Alcohol abuse Father    History  Substance Use Topics  . Smoking status: Current Every Day Smoker -- 0.50 packs/day for 39 years    Types: Cigarettes  . Smokeless tobacco: Never Used     Comment: trying.  Cutting back 3-4 cigerettes  . Alcohol Use: No   OB History   Grav Para Term Preterm Abortions TAB SAB Ect Mult Living                 Review of Systems   Review of Systems  Gen: no weight loss, night sweats + fevers and chills,  Eyes: no discharge or drainage, no occular pain or visual changes  Nose: no epistaxis or rhinorrhea  Mouth: no dental pain, no sore throat  Neck: no neck pain  Lungs:No wheezing,or hemoptysis + coughing, SOB, CP, increased effort of breathing CV: no chest pain, palpitations, dependent edema or orthopnea  Abd: no abdominal pain, nausea, vomiting, diarrhea GU: no dysuria or gross hematuria  MSK:  No muscle weakness or pain Neuro: no headache, no focal neurologic deficits  Skin: no rash or wounds Psyche: no  complaints    Allergies  Acetaminophen  Home Medications   Prior to Admission medications   Medication Sig Start Date End Date Taking? Authorizing Provider  buPROPion (WELLBUTRIN) 100 MG tablet Take 1 tablet (100 mg total) by mouth daily before breakfast. For depression 01/24/14  Yes Encarnacion Slates, NP  gabapentin (NEURONTIN) 300 MG capsule Take 1 capsule (300 mg total) by mouth 3 (three) times daily. For substance withdrawal syndrome 01/24/14  Yes Encarnacion Slates, NP  hydrOXYzine (ATARAX/VISTARIL) 25 MG tablet Take 25 mg (1 tablet) four times daily as needed: For anxiety/tension 01/24/14  Yes Encarnacion Slates, NP  lisinopril (PRINIVIL,ZESTRIL) 20 MG tablet  Take 1 tablet (20 mg total) by mouth daily. For high blood pressure   Yes Bartholomew Crews, MD  meloxicam (MOBIC) 7.5 MG tablet Take 1 tablet (7.5 mg total) by mouth daily. 03/26/14 03/26/15 Yes Carter Kitten, MD  mirtazapine (REMERON) 7.5 MG tablet Take 1 tablet (7.5 mg total) by mouth at bedtime. For sleep 01/24/14  Yes Encarnacion Slates, NP  risperiDONE (RISPERDAL) 0.25 MG tablet Take 1 tablet (0.25 mg total) by mouth 2 (two) times daily. For mood control 01/24/14  Yes Encarnacion Slates, NP  silver sulfADIAZINE (SILVADENE) 1 % cream Apply 1 application topically 2 (two) times daily.   Yes Historical Provider, MD  traZODone (DESYREL) 50 MG tablet Take 1 tablet (50 mg total) by mouth at bedtime. For sleep 01/24/14  Yes Encarnacion Slates, NP  amoxicillin-clavulanate (AUGMENTIN) 875-125 MG per tablet Take 1 tablet by mouth every 12 (twelve) hours. 03/29/14   Jennifer L Piepenbrink, PA-C  guaiFENesin-codeine 100-10 MG/5ML syrup Take 10 mLs by mouth 3 (three) times daily as needed for cough. 03/29/14   Jennifer L Piepenbrink, PA-C   BP 151/76  Pulse 116  Temp(Src) 99.9 F (37.7 C) (Oral)  Resp 20  SpO2 99% Physical Exam  Nursing note and vitals reviewed. Constitutional: She appears well-developed and well-nourished. She appears ill. She appears distressed.  HENT:  Head: Normocephalic and atraumatic.  Eyes: Pupils are equal, round, and reactive to light.  Neck: Normal range of motion. Neck supple.  Cardiovascular: Regular rhythm.  Tachycardia present.   Pulmonary/Chest: Tachypnea noted. She has wheezes. She has rhonchi.  Abdominal: Soft.  Neurological: She is alert.  Skin: Skin is warm and dry.  Psychiatric: Her mood appears anxious.    ED Course  Procedures (including critical care time) Labs Review Labs Reviewed  CBC WITH DIFFERENTIAL - Abnormal; Notable for the following:    WBC 12.1 (*)    HCT 35.1 (*)    Neutrophils Relative % 86 (*)    Neutro Abs 10.4 (*)    Lymphocytes Relative 10 (*)     All other components within normal limits  BASIC METABOLIC PANEL - Abnormal; Notable for the following:    Sodium 132 (*)    Glucose, Bld 162 (*)    All other components within normal limits  CULTURE, BLOOD (ROUTINE X 2)  CULTURE, BLOOD (ROUTINE X 2)  I-STAT CG4 LACTIC ACID, ED    Imaging Review Dg Chest 2 View  03/29/2014   CLINICAL DATA:  chest pain  EXAM: CHEST  2 VIEW  COMPARISON:  Two-view chest 02/17/2014.  FINDINGS: Low lung volumes. Discoid atelectasis versus scarring right mid hemi thorax, stable symmetric smoothly marginated rounded densities at the lung bases consistent with nipple shadows. Linear areas of density, mild, within the lung bases. The opacity in the left lung base and right  perihilar region have decreased. No new focal regions of consolidation. No acute osseous abnormalities.  IMPRESSION: Resolving infiltrates right perihilar and left lung base.  Minimal scarring versus atelectasis within the lung bases  Discoid atelectasis versus scar right mid hemithorax.   Electronically Signed   By: Margaree Mackintosh M.D.   On: 03/29/2014 13:27     EKG Interpretation None      MDM   Final diagnoses:  Community acquired pneumonia    Patient was discharged earlier today with pneumonia and has not done well at home. She is from homeless shelter and presents to the ER febrile, tachycardic, tachypnea. She did not get her medicines filled.    Medications  0.9 %  sodium chloride infusion (not administered)  albuterol (PROVENTIL) (2.5 MG/3ML) 0.083% nebulizer solution 5 mg (5 mg Nebulization Given 03/29/14 2237)  ipratropium (ATROVENT) nebulizer solution 0.5 mg (0.5 mg Nebulization Given 03/29/14 2237)  sodium chloride 0.9 % bolus 1,000 mL (0 mLs Intravenous Stopped 03/29/14 2250)  dexamethasone (DECADRON) injection 10 mg (10 mg Intravenous Given 03/29/14 2244)  azithromycin (ZITHROMAX) 500 mg in dextrose 5 % 250 mL IVPB (0 mg Intravenous Stopped 03/29/14 2345)  albuterol (PROVENTIL)  (2.5 MG/3ML) 0.083% nebulizer solution 5 mg (5 mg Nebulization Given 03/30/14 0018)  ipratropium (ATROVENT) nebulizer solution 0.5 mg (0.5 mg Nebulization Given 03/30/14 0018)  ibuprofen (ADVIL,MOTRIN) tablet 600 mg (600 mg Oral Given 03/30/14 0028)   Labs Reviewed  CBC WITH DIFFERENTIAL - Abnormal; Notable for the following:    WBC 12.1 (*)    HCT 35.1 (*)    Neutrophils Relative % 86 (*)    Neutro Abs 10.4 (*)    Lymphocytes Relative 10 (*)    All other components within normal limits  BASIC METABOLIC PANEL - Abnormal; Notable for the following:    Sodium 132 (*)    Glucose, Bld 162 (*)    All other components within normal limits  CULTURE, BLOOD (ROUTINE X 2)  CULTURE, BLOOD (ROUTINE X 2)  I-STAT CG4 LACTIC ACID, ED    Patient has received two breathing treatments which has helped her symptoms but she continues to be SOB. Given Decadron 10 mg IV. Negative lactate, the patient does not appear to be septic.  Filed Vitals:   03/30/14 0029  BP: 151/76  Pulse: 116  Temp: 99.9 F (37.7 C)  Resp: 20    The vitals have improved since intervention initiated. Pt is an Internal Medicine residents patient and has been admitted to there services. Dr. Ellwood Dense to admit. Pt will need transfer to [facility].   Linus Mako, PA-C 03/30/14 0040

## 2014-03-29 NOTE — ED Notes (Signed)
Phlebotomy at the bedside  

## 2014-03-29 NOTE — ED Notes (Signed)
Bed: WS56 Expected date:  Expected time:  Means of arrival:  Comments: EMS/52 yo respiratory distress/dx earlier at [facility] with pneumonia

## 2014-03-29 NOTE — ED Notes (Signed)
Pt reports to the ED via [facility] for eval of possible PNA. Pt was dx with PNA in May and was d/c home with abx. She reports she has been complaint with her abx and finished the full course. Pt reports the person in the bed bedside her at the shelter has PNA and she has developed a productive brown-green cough and chills. Rhonchi noted in the left lower lobe with EMS. Pt also having some chest wall pain that is tender to palpation, worse with deep inspiration, and movement. Pt A&Ox4, resp e/u, and skin warm and dry. VSS en route. Pt 99% on RA for EMS.

## 2014-03-29 NOTE — ED Provider Notes (Signed)
CSN: [id number]     Arrival date & time 03/29/14  1147 History   First MD Initiated Contact with Patient 03/29/14 1200     Chief Complaint  Patient presents with  . Pneumonia     (Consider location/radiation/quality/duration/timing/severity/associated sxs/prior Treatment) HPI Comments: Patient is a 53 yo F PMHx significant for HTN, cocaine abuse, tobacco abuse, vasculitis presenting to the emergency department for 2-3 days of cough with green-yellow sputum and associated posttussive chest discomfort, rhinorrhea, nasal congestion, subjective fevers and chills. Patient states her symptoms feel similar to previous pneumonia from 02/17/2014. Patient states she finished azithromycin without difficulty. Denies any EtOH or cocaine use. Patient with sick contact with pneumonia at homeless shelter.   Past Medical History  Diagnosis Date  . 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 Followed by Dr. Louanne Skye  . Hypertension   . Cocaine abuse     ongoing with resultant vaculitis.  . Tobacco abuse   . Depression   . Normocytic anemia     BL Hgb 9.8-12. Last anemia panel 04/2010 - showing Fe 19, ferritin 101.  Pt on monthly B12 injections  . Rheumatoid arthritis(714.0)     patient reported  . Inflammatory arthritis   . Headache(784.0)   . 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 Skin biopsy      bilateral shin nodules on 04/2010  . Hernia repair    . Irrigation and debridement abscess Bilateral 09/26/2013    Procedure: DEBRIDEMENT ULCERS BILATERAL THIGHS;  Surgeon: Gwenyth Ober, MD;  Location: [facility] OR;  Service: General;  Laterality: Bilateral;   Family History  Problem Relation Age of Onset  . Breast cancer Mother     Breast cancer  . Alcohol abuse Mother   . Colon cancer Maternal Aunt 45  . Alcohol abuse Father    History  Substance Use Topics  . Smoking status: Current Every Day Smoker -- 0.50 packs/day  for 39 years    Types: Cigarettes  . Smokeless tobacco: Never Used     Comment: trying.  Cutting back 3-4 cigerettes  . Alcohol Use: No   OB History   Grav Para Term Preterm Abortions TAB SAB Ect Mult Living                 Review of Systems  Constitutional: Positive for fever (subjective) and chills.  Respiratory: Positive for cough and chest tightness.   All other systems reviewed and are negative.     Allergies  Acetaminophen  Home Medications   Prior to Admission medications   Medication Sig Start Date End Date Taking? Authorizing Provider  buPROPion (WELLBUTRIN) 100 MG tablet Take 1 tablet (100 mg total) by mouth daily before breakfast. For depression 01/24/14  Yes Encarnacion Slates, NP  gabapentin (NEURONTIN) 300 MG capsule Take 1 capsule (300 mg total) by mouth 3 (three) times daily. For substance withdrawal syndrome 01/24/14  Yes Encarnacion Slates, NP  hydrOXYzine (ATARAX/VISTARIL) 25 MG tablet Take 25 mg (1 tablet) four times daily as needed: For anxiety/tension 01/24/14  Yes Encarnacion Slates, NP  lisinopril (PRINIVIL,ZESTRIL) 20 MG tablet Take 1 tablet (20 mg total) by mouth daily. For high blood pressure   Yes Bartholomew Crews, MD  meloxicam (MOBIC) 7.5 MG tablet Take 1 tablet (7.5 mg total) by mouth daily. 03/26/14 03/26/15 Yes Carter Kitten, MD  mirtazapine (REMERON) 7.5 MG tablet Take  1 tablet (7.5 mg total) by mouth at bedtime. For sleep 01/24/14  Yes Encarnacion Slates, NP  risperiDONE (RISPERDAL) 0.25 MG tablet Take 1 tablet (0.25 mg total) by mouth 2 (two) times daily. For mood control 01/24/14  Yes Encarnacion Slates, NP  silver sulfADIAZINE (SILVADENE) 1 % cream Apply 1 application topically 2 (two) times daily.   Yes Historical Provider, MD  traZODone (DESYREL) 50 MG tablet Take 1 tablet (50 mg total) by mouth at bedtime. For sleep 01/24/14  Yes Encarnacion Slates, NP  amoxicillin-clavulanate (AUGMENTIN) 875-125 MG per tablet Take 1 tablet by mouth every 12 (twelve) hours. 03/29/14    Kendel Bessey L Shjon Lizarraga, PA-C  guaiFENesin-codeine 100-10 MG/5ML syrup Take 10 mLs by mouth 3 (three) times daily as needed for cough. 03/29/14   Camya Haydon L Leona Pressly, PA-C   BP 178/99  Pulse 105  Temp(Src) 98.8 F (37.1 C) (Oral)  Resp 13  SpO2 96% Physical Exam  Nursing note and vitals reviewed. Constitutional: She is oriented to person, place, and time. She appears well-developed and well-nourished. No distress.  HENT:  Head: Normocephalic and atraumatic.  Right Ear: External ear normal.  Left Ear: External ear normal.  Nose: Nose normal.  Mouth/Throat: Oropharynx is clear and moist. No oropharyngeal exudate.  Eyes: Conjunctivae are normal.  Neck: Normal range of motion. Neck supple.  Cardiovascular: Normal rate, regular rhythm, normal heart sounds and intact distal pulses.   Pulmonary/Chest: Effort normal and breath sounds normal. No respiratory distress. She exhibits tenderness.  Cough appreciated on examination.   Abdominal: Soft. There is no tenderness.  Musculoskeletal: Normal range of motion. She exhibits no edema.  Neurological: She is alert and oriented to person, place, and time.  Skin: Skin is warm and dry. She is not diaphoretic.  Psychiatric: She has a normal mood and affect.    ED Course  Procedures (including critical care time) Medications  fentaNYL (SUBLIMAZE) injection 50 mcg (50 mcg Intravenous Given 03/29/14 1230)  ondansetron (ZOFRAN) injection 4 mg (4 mg Intravenous Given 03/29/14 1227)  cefTRIAXone (ROCEPHIN) 1 g in dextrose 5 % 50 mL IVPB (0 g Intravenous Stopped 03/29/14 1516)  guaiFENesin-codeine 100-10 MG/5ML solution 10 mL (10 mLs Oral Given 03/29/14 1517)    Labs Review Labs Reviewed  URINE RAPID DRUG SCREEN (HOSP PERFORMED) - Abnormal; Notable for the following:    Tetrahydrocannabinol POSITIVE (*)    All other components within normal limits  I-STAT TROPOININ, ED  I-STAT CHEM 8, ED    Imaging Review Dg Chest 2 View  03/29/2014   CLINICAL  DATA:  chest pain  EXAM: CHEST  2 VIEW  COMPARISON:  Two-view chest 02/17/2014.  FINDINGS: Low lung volumes. Discoid atelectasis versus scarring right mid hemi thorax, stable symmetric smoothly marginated rounded densities at the lung bases consistent with nipple shadows. Linear areas of density, mild, within the lung bases. The opacity in the left lung base and right perihilar region have decreased. No new focal regions of consolidation. No acute osseous abnormalities.  IMPRESSION: Resolving infiltrates right perihilar and left lung base.  Minimal scarring versus atelectasis within the lung bases  Discoid atelectasis versus scar right mid hemithorax.   Electronically Signed   By: Margaree Mackintosh M.D.   On: 03/29/2014 13:27     EKG Interpretation None      MDM   Final diagnoses:  Community acquired pneumonia    Filed Vitals:   03/29/14 1546  BP: 178/99  Pulse:   Temp:   Resp: 13  Afebrile, NAD, non-toxic appearing, AAOx4.  Patient has been diagnosed with CAP via chest xray. Pt is not ill appearing, immunocompromised, and does not have multiple co morbidities, therefore I feel like the they can be treated as an OP with abx therapy. I have reviewed nursing notes, vital signs, and all appropriate lab and imaging results for this patient. Pt has been advised to return to the ED if symptoms worsen or they do not improve. Pt verbalizes understanding and is agreeable with plan. Patient is stable at time of discharge. Patient d/w with Dr. Jeneen Rinks, agrees with plan.        Harlow Mares, PA-C 03/29/14 1614

## 2014-03-29 NOTE — ED Notes (Signed)
PA at the bedside.

## 2014-03-29 NOTE — ED Notes (Signed)
Pt BIB EMS. Pt is from homeless shelter. Pt was seen earlier today at [facility]. Pt diagnosed with pneumonia. Pt given Rx, but has not gotten it filled. Pt c/o shortness of breath and pain in chest with inspiration. Pt alert, no acute distress. Skin warm and dry.

## 2014-03-29 NOTE — Discharge Instructions (Signed)
Please follow up with your primary care physician in 1-2 days. If you do not have one please call the [place] number listed above. Please take your antibiotic until completion. Please take pain medication and/or muscle relaxants as prescribed and as needed for pain. Please do not drive on narcotic pain medication or on muscle relaxants. Please read all discharge instructions and return precautions.   Pneumonia, Adult Pneumonia is an infection of the lungs.  CAUSES Pneumonia may be caused by bacteria or a virus. Usually, these infections are caused by breathing infectious particles into the lungs (respiratory tract). SYMPTOMS   Cough.  Fever.  Chest pain.  Increased rate of breathing.  Wheezing.  Mucus production. DIAGNOSIS  If you have the common symptoms of pneumonia, your caregiver will typically confirm the diagnosis with a chest X-ray. The X-ray will show an abnormality in the lung (pulmonary infiltrate) if you have pneumonia. Other tests of your blood, urine, or sputum may be done to find the specific cause of your pneumonia. Your caregiver may also do tests (blood gases or pulse oximetry) to see how well your lungs are working. TREATMENT  Some forms of pneumonia may be spread to other people when you cough or sneeze. You may be asked to wear a mask before and during your exam. Pneumonia that is caused by bacteria is treated with antibiotic medicine. Pneumonia that is caused by the influenza virus may be treated with an antiviral medicine. Most other viral infections must run their course. These infections will not respond to antibiotics.  PREVENTION A pneumococcal shot (vaccine) is available to prevent a common bacterial cause of pneumonia. This is usually suggested for:  People over 1 years old.  Patients on chemotherapy.  People with chronic lung problems, such as bronchitis or emphysema.  People with immune system problems. If you are over 65 or have  a high risk condition, you may receive the pneumococcal vaccine if you have not received it before. In some countries, a routine influenza vaccine is also recommended. This vaccine can help prevent some cases of pneumonia.You may be offered the influenza vaccine as part of your care. If you smoke, it is time to quit. You may receive instructions on how to stop smoking. Your caregiver can provide medicines and counseling to help you quit. HOME CARE INSTRUCTIONS   Cough suppressants may be used if you are losing too much rest. However, coughing protects you by clearing your lungs. You should avoid using cough suppressants if you can.  Your caregiver may have prescribed medicine if he or she thinks your pneumonia is caused by a bacteria or influenza. Finish your medicine even if you start to feel better.  Your caregiver may also prescribe an expectorant. This loosens the mucus to be coughed up.  Only take over-the-counter or prescription medicines for pain, discomfort, or fever as directed by your caregiver.  Do not smoke. Smoking is a common cause of bronchitis and can contribute to pneumonia. If you are a smoker and continue to smoke, your cough may last several weeks after your pneumonia has cleared.  A cold steam vaporizer or humidifier in your room or home may help loosen mucus.  Coughing is often worse at night. Sleeping in a semi-upright position in a recliner or using a couple pillows under your head will help with this.  Get rest as you feel it is needed. Your body will usually let you know when you need to rest. [place]  CARE IF:   Your illness becomes worse. This is especially true if you are elderly or weakened from any other disease.  You cannot control your cough with suppressants and are losing sleep.  You begin coughing up blood.  You develop pain which is getting worse or is uncontrolled with medicines.  You have a fever.  Any of the symptoms which  initially brought you in for treatment are getting worse rather than better.  You develop shortness of breath or chest pain. MAKE SURE YOU:   Understand these instructions.  Will watch your condition.  Will get help right away if you are not doing well or get worse. Document Released: 09/21/2005 Document Revised: 12/14/2011 Document Reviewed: 12/11/2010 [facility] Patient Information 2015 [place], [place]. This information is not intended to replace advice given to you by your health care provider. Make sure you discuss any questions you have with your health care provider.

## 2014-03-29 NOTE — ED Notes (Signed)
Patient returned from X-ray 

## 2014-03-30 ENCOUNTER — Encounter (HOSPITAL_COMMUNITY): Payer: Self-pay | Admitting: General Practice

## 2014-03-30 ENCOUNTER — Other Ambulatory Visit: Payer: Self-pay

## 2014-03-30 DIAGNOSIS — D649 Anemia, unspecified: Secondary | ICD-10-CM | POA: Diagnosis present

## 2014-03-30 DIAGNOSIS — J189 Pneumonia, unspecified organism: Secondary | ICD-10-CM | POA: Diagnosis present

## 2014-03-30 DIAGNOSIS — I309 Acute pericarditis, unspecified: Secondary | ICD-10-CM | POA: Diagnosis present

## 2014-03-30 DIAGNOSIS — Z5900 Homelessness unspecified: Secondary | ICD-10-CM | POA: Diagnosis not present

## 2014-03-30 DIAGNOSIS — I517 Cardiomegaly: Secondary | ICD-10-CM

## 2014-03-30 DIAGNOSIS — R079 Chest pain, unspecified: Secondary | ICD-10-CM

## 2014-03-30 DIAGNOSIS — Z803 Family history of malignant neoplasm of breast: Secondary | ICD-10-CM | POA: Diagnosis not present

## 2014-03-30 DIAGNOSIS — A419 Sepsis, unspecified organism: Secondary | ICD-10-CM

## 2014-03-30 DIAGNOSIS — F329 Major depressive disorder, single episode, unspecified: Secondary | ICD-10-CM

## 2014-03-30 DIAGNOSIS — I10 Essential (primary) hypertension: Secondary | ICD-10-CM | POA: Diagnosis present

## 2014-03-30 DIAGNOSIS — F17200 Nicotine dependence, unspecified, uncomplicated: Secondary | ICD-10-CM | POA: Diagnosis present

## 2014-03-30 DIAGNOSIS — I519 Heart disease, unspecified: Secondary | ICD-10-CM | POA: Diagnosis present

## 2014-03-30 DIAGNOSIS — F1410 Cocaine abuse, uncomplicated: Secondary | ICD-10-CM | POA: Diagnosis present

## 2014-03-30 DIAGNOSIS — F1910 Other psychoactive substance abuse, uncomplicated: Secondary | ICD-10-CM | POA: Diagnosis present

## 2014-03-30 DIAGNOSIS — F1210 Cannabis abuse, uncomplicated: Secondary | ICD-10-CM | POA: Diagnosis present

## 2014-03-30 DIAGNOSIS — M069 Rheumatoid arthritis, unspecified: Secondary | ICD-10-CM | POA: Diagnosis present

## 2014-03-30 LAB — CBC WITH DIFFERENTIAL/PLATELET
BASOS ABS: 0 10*3/uL (ref 0.0–0.1)
BASOS ABS: 0 10*3/uL (ref 0.0–0.1)
Basophils Relative: 0 % (ref 0–1)
Basophils Relative: 0 % (ref 0–1)
Eosinophils Absolute: 0 10*3/uL (ref 0.0–0.7)
Eosinophils Absolute: 0 10*3/uL (ref 0.0–0.7)
Eosinophils Relative: 0 % (ref 0–5)
Eosinophils Relative: 0 % (ref 0–5)
HCT: 35.1 % — ABNORMAL LOW (ref 36.0–46.0)
HEMATOCRIT: 34.5 % — AB (ref 36.0–46.0)
HEMOGLOBIN: 12 g/dL (ref 12.0–15.0)
HEMOGLOBIN: 11.5 g/dL — AB (ref 12.0–15.0)
LYMPHS PCT: 10 % — AB (ref 12–46)
Lymphocytes Relative: 5 % — ABNORMAL LOW (ref 12–46)
Lymphs Abs: 1.2 10*3/uL (ref 0.7–4.0)
Lymphs Abs: 0.7 10*3/uL (ref 0.7–4.0)
MCH: 28.8 pg (ref 26.0–34.0)
MCH: 28.5 pg (ref 26.0–34.0)
MCHC: 34.2 g/dL (ref 30.0–36.0)
MCHC: 33.3 g/dL (ref 30.0–36.0)
MCV: 84.2 fL (ref 78.0–100.0)
MCV: 85.6 fL (ref 78.0–100.0)
MONO ABS: 0.6 10*3/uL (ref 0.1–1.0)
Monocytes Absolute: 0.5 10*3/uL (ref 0.1–1.0)
Monocytes Relative: 4 % (ref 3–12)
Monocytes Relative: 4 % (ref 3–12)
NEUTROS ABS: 10.4 10*3/uL — AB (ref 1.7–7.7)
NEUTROS ABS: 13.1 10*3/uL — AB (ref 1.7–7.7)
NEUTROS PCT: 86 % — AB (ref 43–77)
NEUTROS PCT: 91 % — AB (ref 43–77)
Platelets: 205 10*3/uL (ref 150–400)
Platelets: 200 10*3/uL (ref 150–400)
RBC: 4.17 MIL/uL (ref 3.87–5.11)
RBC: 4.03 MIL/uL (ref 3.87–5.11)
RDW: 13.7 % (ref 11.5–15.5)
RDW: 13.9 % (ref 11.5–15.5)
WBC: 12.1 10*3/uL — AB (ref 4.0–10.5)
WBC: 14.4 10*3/uL — ABNORMAL HIGH (ref 4.0–10.5)

## 2014-03-30 LAB — C-REACTIVE PROTEIN: CRP: 18.7 mg/dL — AB (ref <0.60)

## 2014-03-30 LAB — URINALYSIS W MICROSCOPIC (NOT AT ARMC)
BILIRUBIN URINE: NEGATIVE
GLUCOSE, UA: 250 mg/dL — AB
Ketones, ur: NEGATIVE mg/dL
Leukocytes, UA: NEGATIVE
Nitrite: NEGATIVE
PH: 6.5 (ref 5.0–8.0)
Protein, ur: NEGATIVE mg/dL
SPECIFIC GRAVITY, URINE: 1.01 (ref 1.005–1.030)
UROBILINOGEN UA: 1 mg/dL (ref 0.0–1.0)

## 2014-03-30 LAB — INFLUENZA PANEL BY PCR (TYPE A & B)
H1N1 flu by pcr: NOT DETECTED
INFLBPCR: NEGATIVE
Influenza A By PCR: NEGATIVE

## 2014-03-30 LAB — BASIC METABOLIC PANEL
BUN: 9 mg/dL (ref 6–23)
BUN: 9 mg/dL (ref 6–23)
CHLORIDE: 96 meq/L (ref 96–112)
CHLORIDE: 96 meq/L (ref 96–112)
CO2: 21 meq/L (ref 19–32)
CO2: 19 mEq/L (ref 19–32)
Calcium: 8.6 mg/dL (ref 8.4–10.5)
Calcium: 9.7 mg/dL (ref 8.4–10.5)
Creatinine, Ser: 0.58 mg/dL (ref 0.50–1.10)
Creatinine, Ser: 0.58 mg/dL (ref 0.50–1.10)
GFR calc non Af Amer: >90 mL/min (ref >90)
GFR calc non Af Amer: >90 mL/min (ref >90)
Glucose, Bld: 162 mg/dL — ABNORMAL HIGH (ref 70–99)
Glucose, Bld: 199 mg/dL — ABNORMAL HIGH (ref 70–99)
POTASSIUM: 4.1 meq/L (ref 3.7–5.3)
POTASSIUM: 3.8 meq/L (ref 3.7–5.3)
Sodium: 132 mEq/L — ABNORMAL LOW (ref 137–147)
Sodium: 136 mEq/L — ABNORMAL LOW (ref 137–147)

## 2014-03-30 LAB — LEGIONELLA ANTIGEN, URINE: Legionella Antigen, Urine: NEGATIVE

## 2014-03-30 LAB — SEDIMENTATION RATE: Sed Rate: 99 mm/hr — ABNORMAL HIGH (ref 0–22)

## 2014-03-30 LAB — STREP PNEUMONIAE URINARY ANTIGEN: Strep Pneumo Urinary Antigen: NEGATIVE

## 2014-03-30 LAB — PROCALCITONIN: Procalcitonin: 2.08 ng/mL

## 2014-03-30 LAB — TROPONIN I
Troponin I: <0.3 ng/mL (ref <0.30)
Troponin I: <0.3 ng/mL (ref <0.30)

## 2014-03-30 LAB — GLUCOSE, CAPILLARY: GLUCOSE-CAPILLARY: 203 mg/dL — AB (ref 70–99)

## 2014-03-30 MED ORDER — BENZONATATE 100 MG PO CAPS
200.0000 mg | ORAL_CAPSULE | Freq: Three times a day (TID) | ORAL | Status: DC | PRN
  Administered 2014-03-30: 200 mg via ORAL
  Filled 2014-03-30 (×3): qty 2

## 2014-03-30 MED ORDER — IBUPROFEN 400 MG PO TABS
400.0000 mg | ORAL_TABLET | Freq: Four times a day (QID) | ORAL | Status: DC | PRN
  Filled 2014-03-30 (×2): qty 1

## 2014-03-30 MED ORDER — COLCHICINE 0.6 MG PO TABS
0.6000 mg | ORAL_TABLET | Freq: Two times a day (BID) | ORAL | Status: DC
  Administered 2014-03-30: 0.6 mg via ORAL
  Filled 2014-03-30 (×2): qty 1

## 2014-03-30 MED ORDER — TRAZODONE HCL 50 MG PO TABS
50.0000 mg | ORAL_TABLET | Freq: Every day | ORAL | Status: DC
  Administered 2014-03-30: 50 mg via ORAL
  Filled 2014-03-30 (×2): qty 1

## 2014-03-30 MED ORDER — IBUPROFEN 400 MG PO TABS
400.0000 mg | ORAL_TABLET | Freq: Four times a day (QID) | ORAL | Status: DC | PRN
  Filled 2014-03-30: qty 1

## 2014-03-30 MED ORDER — DEXTROSE 5 % IV SOLN
1.0000 g | INTRAVENOUS | Status: DC
  Filled 2014-03-30: qty 10

## 2014-03-30 MED ORDER — SODIUM CHLORIDE 0.9 % IV SOLN
INTRAVENOUS | Status: DC
  Administered 2014-03-30: 05:00:00 via INTRAVENOUS

## 2014-03-30 MED ORDER — SODIUM CHLORIDE 0.9 % IV SOLN
INTRAVENOUS | Status: AC
  Administered 2014-03-30: 100 mL/h via INTRAVENOUS

## 2014-03-30 MED ORDER — DEXTROSE 5 % IV SOLN
500.0000 mg | INTRAVENOUS | Status: DC
  Administered 2014-03-30: 500 mg via INTRAVENOUS
  Filled 2014-03-30 (×2): qty 500

## 2014-03-30 MED ORDER — RISPERIDONE 0.25 MG PO TABS
0.2500 mg | ORAL_TABLET | Freq: Two times a day (BID) | ORAL | Status: DC
  Administered 2014-03-30 – 2014-03-31 (×3): 0.25 mg via ORAL
  Filled 2014-03-30 (×4): qty 1

## 2014-03-30 MED ORDER — LISINOPRIL 20 MG PO TABS
20.0000 mg | ORAL_TABLET | Freq: Every day | ORAL | Status: DC
  Administered 2014-03-30 – 2014-03-31 (×2): 20 mg via ORAL
  Filled 2014-03-30 (×2): qty 1

## 2014-03-30 MED ORDER — IPRATROPIUM-ALBUTEROL 0.5-2.5 (3) MG/3ML IN SOLN
3.0000 mL | Freq: Four times a day (QID) | RESPIRATORY_TRACT | Status: DC
  Administered 2014-03-30 – 2014-03-31 (×5): 3 mL via RESPIRATORY_TRACT
  Filled 2014-03-30 (×5): qty 3

## 2014-03-30 MED ORDER — MIRTAZAPINE 7.5 MG PO TABS
7.5000 mg | ORAL_TABLET | Freq: Every day | ORAL | Status: DC
  Administered 2014-03-30: 7.5 mg via ORAL
  Filled 2014-03-30 (×2): qty 1

## 2014-03-30 MED ORDER — DEXTROSE 5 % IV SOLN
1.0000 g | INTRAVENOUS | Status: DC
  Administered 2014-03-30: 1 g via INTRAVENOUS
  Filled 2014-03-30 (×2): qty 10

## 2014-03-30 MED ORDER — ENOXAPARIN SODIUM 40 MG/0.4ML ~~LOC~~ SOLN
40.0000 mg | Freq: Every day | SUBCUTANEOUS | Status: DC
  Administered 2014-03-30 – 2014-03-31 (×2): 40 mg via SUBCUTANEOUS
  Filled 2014-03-30 (×2): qty 0.4

## 2014-03-30 MED ORDER — IBUPROFEN 600 MG PO TABS
600.0000 mg | ORAL_TABLET | Freq: Three times a day (TID) | ORAL | Status: DC
  Administered 2014-03-30 – 2014-03-31 (×3): 600 mg via ORAL
  Filled 2014-03-30 (×6): qty 1

## 2014-03-30 MED ORDER — GABAPENTIN 300 MG PO CAPS
300.0000 mg | ORAL_CAPSULE | Freq: Three times a day (TID) | ORAL | Status: DC
  Administered 2014-03-30 – 2014-03-31 (×4): 300 mg via ORAL
  Filled 2014-03-30 (×6): qty 1

## 2014-03-30 MED ORDER — COLCHICINE 0.6 MG PO TABS
0.6000 mg | ORAL_TABLET | Freq: Every day | ORAL | Status: DC
  Administered 2014-03-31: 0.6 mg via ORAL
  Filled 2014-03-30: qty 1

## 2014-03-30 MED ORDER — HYDROXYZINE HCL 25 MG PO TABS
25.0000 mg | ORAL_TABLET | Freq: Four times a day (QID) | ORAL | Status: DC | PRN
  Filled 2014-03-30: qty 1

## 2014-03-30 MED ORDER — IBUPROFEN 200 MG PO TABS
600.0000 mg | ORAL_TABLET | Freq: Once | ORAL | Status: AC
  Administered 2014-03-30: 600 mg via ORAL
  Filled 2014-03-30: qty 3

## 2014-03-30 MED ORDER — BUPROPION HCL 100 MG PO TABS
100.0000 mg | ORAL_TABLET | Freq: Every day | ORAL | Status: DC
  Administered 2014-03-30 – 2014-03-31 (×2): 100 mg via ORAL
  Filled 2014-03-30 (×3): qty 1

## 2014-03-30 NOTE — Progress Notes (Signed)
Placed call to MD about EKG results

## 2014-03-30 NOTE — Care Management Note (Unsigned)
    Page 1 of 1   03/30/2014     4:11:50 PM CARE MANAGEMENT NOTE 03/30/2014  Patient:  [name], [name]   Account Number:  [id number]  Date Initiated:  03/30/2014  Documentation initiated by:  AMERSON,JULIE  Subjective/Objective Assessment:   Pt adm on 03/29/14 with PNA.  PTA, pt independent, and is homeless.     Action/Plan:   CSW to follow for homeless issues.  Attempted to speak with pt x 2 to discuss issues with affording meds, but she was sleeping and would not awaken to speak with me.  Will have weekend CM follow up.   Anticipated DC Date:  04/01/2014   Anticipated DC Plan:  [place]  CM consult      Choice offered to / List presented to:             Status of service:  In process, will continue to follow Medicare Important Message given?   (If response is "NO", the following Medicare IM given date fields will be blank) Date Medicare IM given:   Date Additional Medicare IM given:    Discharge Disposition:    Per UR Regulation:  Reviewed for med. necessity/level of care/duration of stay  If discussed at [place] of Stay Meetings, dates discussed:    Comments:

## 2014-03-30 NOTE — H&P (Signed)
Date: 03/30/2014               Patient Name:  [name] MRN: [id number]  DOB: [birth year] Age / Sex: 53 y.o., female   PCP: Arman Filter, MD         Medical Service: Internal Medicine Teaching Service         Attending Physician: Dr. Gilles Chiquito, MD    First Contact: Dr. Michail Jewels Pager: [phone]  Second Contact: Dr. 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  She was seen in ED last month and prescribed azithromycin for CAP.  She does not remember being this sick last month but says she completed the antibiotic she was given.  She initially came to the ED earlier today with cough productive of green mucous and chills for the past 2-3 days.  She was evaluated and provided with antibiotic rx but she was unable to fill rx she received today due to cost.   She now returns 2/2 to dyspnea.  She lives in a shelter and one of the other residents was recently in the hospital and treated for pneumonia.  She has a good appetite, denies N/V, diarrhea or dysuria.  She has some pleuritic chest pain with breathing and coughing.  She says she quit cocaine 2 months ago but continues to smoke 3 cigarrettes/per day and marijuana daily.  She denies EtOH or IVDU.    In the ED:  Tmax:  100.71F, RR 18-27, SpO2 98-100%, HR 103-123, BP 153-186/76-185mHg; she received 1L bolus, Decadron, azithromycin, duonebs and ibuprofen.  Meds: Current Facility-Administered Medications  Medication Dose Route Frequency Provider Last Rate Last Dose  . 0.9 %  sodium chloride infusion   Intravenous STAT TLinus Mako PA-C       Current Outpatient Prescriptions   Medication Sig Dispense Refill  . buPROPion (WELLBUTRIN) 100 MG tablet Take 1 tablet (100 mg total) by mouth daily before breakfast. For depression  30 tablet  0  . gabapentin (NEURONTIN) 300 MG capsule Take 1 capsule (300 mg total) by mouth 3 (three) times daily. For substance withdrawal syndrome  90 capsule  0  . hydrOXYzine (ATARAX/VISTARIL) 25 MG tablet Take 25 mg (1 tablet) four times daily as needed: For anxiety/tension  120 tablet  0  . lisinopril (PRINIVIL,ZESTRIL) 20 MG tablet Take 1 tablet (20 mg total) by mouth daily. For high blood pressure  30 tablet  5  . meloxicam (MOBIC) 7.5 MG tablet Take 1 tablet (7.5 mg total) by mouth daily.  30 tablet  2  . mirtazapine (REMERON) 7.5 MG tablet Take 1 tablet (7.5 mg total) by mouth at bedtime. For sleep  30 tablet  0  . risperiDONE (RISPERDAL) 0.25 MG tablet Take 1 tablet (0.25 mg total) by mouth 2 (two) times daily. For mood control  60 tablet  0  . silver sulfADIAZINE (SILVADENE) 1 % cream Apply 1 application topically 2 (two) times daily.      . traZODone (DESYREL) 50 MG tablet Take 1 tablet (50 mg total) by mouth at bedtime. For sleep  30 tablet  0  . amoxicillin-clavulanate (AUGMENTIN)  875-125 MG per tablet Take 1 tablet by mouth every 12 (twelve) hours.  20 tablet  0  . guaiFENesin-codeine 100-10 MG/5ML syrup Take 10 mLs by mouth 3 (three) times daily as needed for cough.  120 mL  0    Allergies: Allergies as of 03/29/2014 - Review Complete 03/29/2014  Allergen Reaction Noted  . Acetaminophen Swelling    Past Medical History  Diagnosis Date  . 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 Followed by Dr. Louanne Skye  . Hypertension   . Cocaine abuse     ongoing with resultant vaculitis.  . Tobacco abuse   . Depression   . Normocytic anemia     BL Hgb 9.8-12. Last anemia panel 04/2010 - showing Fe 19, ferritin 101.  Pt on monthly B12 injections  . Rheumatoid arthritis(714.0)     patient reported  . Inflammatory arthritis   .  Headache(784.0)   . 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 Skin biopsy      bilateral shin nodules on 04/2010  . Hernia repair    . Irrigation and debridement abscess Bilateral 09/26/2013    Procedure: DEBRIDEMENT ULCERS BILATERAL THIGHS;  Surgeon: Gwenyth Ober, MD;  Location: [facility] OR;  Service: General;  Laterality: Bilateral;   Family History  Problem Relation Age of Onset  . Breast cancer Mother     Breast cancer  . Alcohol abuse Mother   . Colon cancer Maternal Aunt 7  . Alcohol abuse Father    History   Social History  . Marital Status: Single    Spouse Name: N/A    Number of Children: 0  . Years of Education: 11th grade   Occupational History  . Disability     since 2011, due to her rheumatoid arthritis   Social History Main Topics  . Smoking status: Current Every Day Smoker -- 0.50 packs/day for 39 years    Types: Cigarettes  . Smokeless tobacco: Never Used     Comment: trying.  Cutting back 3-4 cigerettes  . Alcohol Use: No  . Drug Use: Yes    Special: "Crack" cocaine, Cocaine, Marijuana     Comment: smokes cocaine  . Sexual Activity: No   Other Topics Concern  . Not on file   Social History Narrative   Unemployed:  cleaning in past   Living at [facility]; has Medicaid.   crack/cocaine use; pt denies IVDU   tobacco:  1/2 ppd, trying to quit   alcohol:  none        Review of Systems: Pertinent items are noted in HPI.  Physical Exam: Blood pressure 151/76, pulse 116, temperature 99.9 F (37.7 C), temperature source Oral, resp. rate 20, SpO2 99.00%. General: resting in bed in NAD HEENT: PERRL, EOMI, oropharynx clear, neck FROM without discomfort Cardiac: RRR, no rubs, murmurs or gallops, central and left chest TTP Pulm: decreased BS, no wheezes, rales or rhonchi, no respiratory distress, on RA Abd: soft, nontender, nondistended, BS present Ext: warm and well  perfused, no pedal edema, 2+ DPs and 2+ radials  Neuro: alert and oriented X3, responding appropriately, cranial nerves II-XII grossly intact, strength and sensation equal and intact B/L Psych:  Mood, affect and behavior are normal.  Lab results: Basic Metabolic Panel:  Recent Labs  03/29/14 1436 03/29/14 2328  NA 139 132*  K 3.9 4.1  CL 100 96  CO2  --  21  GLUCOSE 89 162*  BUN 8 9  CREATININE 0.70 0.58  CALCIUM  --  8.6   CBC:  Recent Labs  03/29/14 1436 03/29/14 2328  WBC  --  12.1*  NEUTROABS  --  10.4*  HGB 12.6 12.0  HCT 37.0 35.1*  MCV  --  84.2  PLT  --  205   Urine Drug Screen: Drugs of Abuse     Component Value Date/Time   LABOPIA NONE DETECTED 03/29/2014 1423   LABOPIA PPS 08/31/2012 1045   COCAINSCRNUR NONE DETECTED 03/29/2014 1423   COCAINSCRNUR NEG 08/31/2012 1045   LABBENZ NONE DETECTED 03/29/2014 1423   LABBENZ NEG 08/31/2012 1045   LABBENZ NEGATIVE 06/12/2011 0221   AMPHETMU NONE DETECTED 03/29/2014 1423   AMPHETMU NEGATIVE 06/12/2011 0221   THCU POSITIVE* 03/29/2014 1423   THCU 63.4* 05/20/2012 1405   LABBARB NONE DETECTED 03/29/2014 1423   LABBARB NEG 08/31/2012 1045   Imaging results:  Dg Chest 2 View  03/29/2014   CLINICAL DATA:  chest pain  EXAM: CHEST  2 VIEW  COMPARISON:  Two-view chest 02/17/2014.  FINDINGS: Low lung volumes. Discoid atelectasis versus scarring right mid hemi thorax, stable symmetric smoothly marginated rounded densities at the lung bases consistent with nipple shadows. Linear areas of density, mild, within the lung bases. The opacity in the left lung base and right perihilar region have decreased. No new focal regions of consolidation. No acute osseous abnormalities.  IMPRESSION: Resolving infiltrates right perihilar and left lung base.  Minimal scarring versus atelectasis within the lung bases  Discoid atelectasis versus scar right mid hemithorax.   Electronically Signed   By: Margaree Mackintosh M.D.   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  Sepsis in the setting of pneumonia:  Seen in ED 1 month ago for fever, chills, cough, received dose of ceftriaxone and d/c on azithromycin for CAP.  Back to ED today with same cc and given IV Rocephin dose and d/c on Augmentin.  She was d/c but returned 2/2 to SOB and overall feeling unwell.  + fever, leukocytosis (12.1), productive cough and pleuritic chest pain c/w PNA.  CXR shows resolving infiltrates. - admit to IMTS  - supplemental oxygen prn - azithromycin and ceftriaxone for CAP - duonebs - Tessalon for cough - IV fluids at 75cc/hr - follow-up urine strep and legionella, flu, HIV - follow-up blood cultures, sputum gram stain and culture - trend WBC and fever curve  Chest pain:   Pleuritic pain and chest is TTP so less likely ACS.  Likely MSK 2/2 to coughing.  Some periods of tachycardia, but no hypoxia to suggest PE. - CE x 3 - Tessalon for cough - ibuprofen for anti-inflammatory  MDD:  Stable - continue Trazodone, Wellbutrin, Remeron and Risperdal  HTN:  Stable - continue lisinopril  Long QT:  QTc 497 - monitor electrolytes  - avoid QT prolonging drugs (Risperdal less likely implicated than other atypical antipsychotics)  Homelessness/inability to afford medications:   - consult to care management  Diet:  Heart healthy VTE ppx: Lovenox Code:  Full   Dispo: Disposition is deferred at this time, awaiting improvement of current medical problems.  Anticipated discharge in approximately 1-2 day(s).   The patient does have a current PCP Arman Filter, MD) and does need an [facility] hospital follow-up appointment after discharge.  The patient does not know have transportation limitations that  hinder transportation to clinic appointments.  Signed: Duwaine Maxin, DO 03/30/2014, 12:37 AM

## 2014-03-30 NOTE — Progress Notes (Signed)
  Echocardiogram 2D Echocardiogram has been performed.  Basilia Jumbo 03/30/2014, 12:27 PM

## 2014-03-30 NOTE — Progress Notes (Signed)
Subjective:   Day of hospitalization: 1  VSS.  Although slightly tachycardic, tmax 100.2.  Pt admitted overnight with dyspnea thought to be secondary to PNA.  She is still having pleuritic chest pain that is worse when lying on the left side.  She has decreased appetite this AM and begins crying when we enter the room.    Objective:   Vital signs in last 24 hours: Filed Vitals:   03/30/14 0224 03/30/14 0335 03/30/14 0447 03/30/14 0943  BP: 153/80 129/75  118/62  Pulse: 103     Temp: 98.7 F (37.1 C)  98.1 F (36.7 C)   TempSrc: Oral Oral Oral   Resp: 18     Height:  5' 2"  (1.575 m)    Weight:  111 lb 8.8 oz (50.6 kg)    SpO2: 98% 100%      Weight: Filed Weights   03/30/14 0335  Weight: 111 lb 8.8 oz (50.6 kg)    I/Os:  Intake/Output Summary (Last 24 hours) at 03/30/14 1154 Last data filed at 03/30/14 1047  Gross per 24 hour  Intake  292.5 ml  Output    825 ml  Net -532.5 ml    Physical Exam: Constitutional: Vital signs reviewed.  Patient is sitting lying in bed in no acute distress and cooperative with exam.   HEENT: [place]/AT; PERRL, EOMI, conjunctivae normal, no scleral icterus  Cardiovascular: RRR, no MRG Pulmonary/Chest: normal respiratory effort, no accessory muscle use, CTAB, no wheezes, rales, or rhonchi Abdominal: Soft. +BS, NT/ND Neurological: A&O x3, CN II-XII grossly intact; non-focal exam Extremities: 2+DP b/l, no C/C/E  Skin: Warm, dry and intact. No rash  Lab Results:  BMP:  Recent Labs Lab 03/29/14 2328 03/30/14 0525  NA 132* 136*  K 4.1 3.8  CL 96 96  CO2 21 19  GLUCOSE 162* 199*  BUN 9 9  CREATININE 0.58 0.58  CALCIUM 8.6 9.7   Anion Gap:  15-->21  CBC:  Recent Labs Lab 03/29/14 2328 03/30/14 0525  WBC 12.1* 14.4*  NEUTROABS 10.4* 13.1*  HGB 12.0 11.5*  HCT 35.1* 34.5*  MCV 84.2 85.6  PLT 205 200   Coagulation: No results found for this basename: LABPROT, INR,  in the last 168 hours  CBG:          Recent  Labs Lab 03/30/14 0345  GLUCAP 203*           HA1C:      No results found for this basename: HGBA1C,  in the last 168 hours  Lipid Panel: No results found for this basename: CHOL, HDL, LDLCALC, TRIG, CHOLHDL, LDLDIRECT,  in the last 168 hours  LFTs: No results found for this basename: AST, ALT, ALKPHOS, BILITOT, PROT, ALBUMIN,  in the last 168 hours  Pancreatic Enzymes: No results found for this basename: LIPASE, AMYLASE,  in the last 168 hours  Lactic Acid/Procalcitonin:  Recent Labs Lab 03/29/14 2343 03/30/14 0830  LATICACIDVEN 0.89  --   PROCALCITON  --  2.08    Ammonia: No results found for this basename: AMMONIA,  in the last 168 hours  Cardiac Enzymes:  Recent Labs Lab 03/30/14 0525 03/30/14 1015  TROPONINI <0.30 <0.30    BNP: No results found for this basename: PROBNP,  in the last 168 hours  D-Dimer: No results found for this basename: DDIMER,  in the last 168 hours  Urinalysis:  Recent Labs Lab 03/30/14 0402  COLORURINE YELLOW  LABSPEC 1.010  PHURINE 6.5  GLUCOSEU  250*  HGBUR SMALL*  BILIRUBINUR NEGATIVE  KETONESUR NEGATIVE  PROTEINUR NEGATIVE  UROBILINOGEN 1.0  NITRITE NEGATIVE  LEUKOCYTESUR NEGATIVE    Micro Results: No results found for this or any previous visit (from the past 240 hour(s)).  Blood Culture:    Component Value Date/Time   SDES URINE, RANDOM 03/30/2014 0402   SPECREQUEST NONE 03/30/2014 0402   CULT  Value: DIPHTHEROIDS(CORYNEBACTERIUM SPECIES) Note: Standardized susceptibility testing for this organism is not available. Performed at [facility] 10/11/2013 0302   REPTSTATUS 03/30/2014 FINAL 03/30/2014 0402    Studies/Results: Dg Chest 2 View  03/29/2014   CLINICAL DATA:  chest pain  EXAM: CHEST  2 VIEW  COMPARISON:  Two-view chest 02/17/2014.  FINDINGS: Low lung volumes. Discoid atelectasis versus scarring right mid hemi thorax, stable symmetric smoothly marginated rounded densities at the lung bases  consistent with nipple shadows. Linear areas of density, mild, within the lung bases. The opacity in the left lung base and right perihilar region have decreased. No new focal regions of consolidation. No acute osseous abnormalities.  IMPRESSION: Resolving infiltrates right perihilar and left lung base.  Minimal scarring versus atelectasis within the lung bases  Discoid atelectasis versus scar right mid hemithorax.   Electronically Signed   By: Margaree Mackintosh M.D.   On: 03/29/2014 13:27    Medications:  Scheduled Meds: . sodium chloride   Intravenous STAT  . azithromycin  500 mg Intravenous Q24H  . buPROPion  100 mg Oral QAC breakfast  . cefTRIAXone (ROCEPHIN)  IV  1 g Intravenous Q24H  . enoxaparin (LOVENOX) injection  40 mg Subcutaneous Daily  . gabapentin  300 mg Oral TID  . ipratropium-albuterol  3 mL Nebulization Q6H  . lisinopril  20 mg Oral Daily  . mirtazapine  7.5 mg Oral QHS  . risperiDONE  0.25 mg Oral BID  . traZODone  50 mg Oral QHS   Continuous Infusions:  PRN Meds: benzonatate, hydrOXYzine, ibuprofen  Antibiotics: Antibiotics Given (last 72 hours)   None      Day of Hospitalization: 1  Consults:    Assessment/Plan:   Principal Problem:   Sepsis Active Problems:   SUBSTANCE ABUSE, MULTIPLE   ESSENTIAL HYPERTENSION   Tobacco abuse   Pneumonia   CAP (community acquired pneumonia)  Probable pericarditis Pt with pleuritic CP, TTP, and non-exertional.  EKG with diffuse ST elevation and PR depression.  Trops x 2 neg. She has a h/o prior cocaine abuse.  We reviewed the EKG with Dr. Angelena Form (cardiology) who agrees that clinical scenario and the EKG is likely pericarditis.    -trend troponins -stat TTE -colchicine and ibuprofen  -repeat EKG at 1400  Sepsis in the setting of CAP Seen in ED 1 month ago for fever, chills, cough, received dose of ceftriaxone and d/c on azithromycin for CAP. Back to ED today with same cc and given IV Rocephin dose and d/c on  Augmentin. She was d/c but returned 2/2 to SOB and overall feeling unwell. + fever, leukocytosis (12.1), productive cough and pleuritic chest pain c/w PNA. CXR shows resolving infiltrates.  -supplemental oxygen prn  -continue azithromycin and ceftriaxone for CAP  -duonebs  -tessalon for cough  -continue IVF at 165m/h -trend BCx, sputum gram stain and culture   Major depressive disorder -continue trazodone, wellbutrin, remeron, risperdal   HTN Stable  -continue lisinopril   Social issues Pt lives in homeless shelter and has difficulty affording medications.  -consult to care management  F/E/N Fluids- NS 1045mh  Electrolytes- Replete as needed  Nutrition- Regular diet   VTE PPx  lovenox 80m SQ qd   Disposition Disposition is deferred, awaiting improvement of current medical problems.  Anticipated discharge in approximately 1-2 day(s).     LOS: 1 day   JJones Bales MD PGY-1, Internal Medicine Teaching Service [phone](7AM-5PM Mon-Fri) 03/30/2014, 11:54 AM

## 2014-03-30 NOTE — H&P (Signed)
  Date: 03/30/2014  Patient name: [name]  Medical record number: [id number]  Date of birth: 09/19/1961   I have seen and evaluated [name] and discussed their care with the Residency Team.  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  She presented with SOB, productive cough of green mucous and chills for 2-3 days.  She notes that she lives in a shelter and multiple people were sick there.  She was seen during this episode previously in the ED and given an Rx for an antibiotic that she could not afford.  She was previously seen in May for similar symptoms and completed a course of azithromycin.  Associated symptoms include chest pain that is constant, dull, worse with cough or deep breath, changed based on position and reproducible.  EKG done initially showed some PR depressions in leads I, II, aVL, V4-V6 and PR elevated in aVR. Repeat EKG showed some diffuse (not all leads) ST elevation in I, II, III, aVL, aVF, V3-V6 and continued PR elevated in aVR.  Cardiac Enzymes have been negative so far.  Exam revealed a possible rub which could have been upper airway sounds, however, the sound disappeared when she was sitting up.  She had no wheezes or decreased breath sounds.  WBC was 12.1.  UA THC +.  CXR showed a resolving infiltrate.  Assessment and Plan: I have seen and evaluated the patient as outlined above. I agree with the formulated Assessment and Plan as detailed in the residents' admission note, with the following changes:   Though the resident team felt her symptoms were most likely related to a pneumonia, I am more inclined given the EKG changes, mild changes on CXR and symptoms of chest pain to call this an acute pericarditis, likely post a recent viral illness.    1. Acute Pericarditis - Will do serial EKGs and cardiac enzymes.  If enzymes should be positive, consider myocarditis -  TTE - O2 PRN - Ibuprofen and colchicine, avoid steroids - Follow up cultures and other labs - Patient started on Abx, will continue for one more day and if TTE and repeat EKGs more convincing for pericarditis, will d/c  Other issues per resident note, however, I am less concerned that she has an acute pneumonia due to reasoning above and she no longer appears to be septic.   Cristina Falcon, MD 6/26/20152:30 PM

## 2014-03-31 DIAGNOSIS — R229 Localized swelling, mass and lump, unspecified: Secondary | ICD-10-CM

## 2014-03-31 DIAGNOSIS — I309 Acute pericarditis, unspecified: Secondary | ICD-10-CM

## 2014-03-31 LAB — CBC
HCT: 30.9 % — ABNORMAL LOW (ref 36.0–46.0)
HEMOGLOBIN: 10.5 g/dL — AB (ref 12.0–15.0)
MCH: 28.5 pg (ref 26.0–34.0)
MCHC: 34 g/dL (ref 30.0–36.0)
MCV: 84 fL (ref 78.0–100.0)
Platelets: 182 10*3/uL (ref 150–400)
RBC: 3.68 MIL/uL — AB (ref 3.87–5.11)
RDW: 14 % (ref 11.5–15.5)
WBC: 8.5 10*3/uL (ref 4.0–10.5)

## 2014-03-31 LAB — BASIC METABOLIC PANEL
BUN: 19 mg/dL (ref 6–23)
CO2: 24 mEq/L (ref 19–32)
Calcium: 9.2 mg/dL (ref 8.4–10.5)
Chloride: 106 mEq/L (ref 96–112)
Creatinine, Ser: 0.62 mg/dL (ref 0.50–1.10)
GFR calc non Af Amer: >90 mL/min (ref >90)
Glucose, Bld: 118 mg/dL — ABNORMAL HIGH (ref 70–99)
POTASSIUM: 4.4 meq/L (ref 3.7–5.3)
SODIUM: 144 meq/L (ref 137–147)

## 2014-03-31 LAB — TROPONIN I: Troponin I: <0.3 ng/mL (ref <0.30)

## 2014-03-31 MED ORDER — IBUPROFEN 600 MG PO TABS: 600.0000 mg | ORAL_TABLET | Freq: Three times a day (TID) | ORAL | Status: AC

## 2014-03-31 MED ORDER — COLCHICINE 0.6 MG PO TABS: 0.6000 mg | ORAL_TABLET | Freq: Every day | ORAL | Status: DC

## 2014-03-31 MED ORDER — IPRATROPIUM-ALBUTEROL 0.5-2.5 (3) MG/3ML IN SOLN: 3.0000 mL | Freq: Two times a day (BID) | RESPIRATORY_TRACT | Status: DC

## 2014-03-31 MED ORDER — PANTOPRAZOLE SODIUM 20 MG PO TBEC: DELAYED_RELEASE_TABLET | ORAL | Status: DC

## 2014-03-31 NOTE — Progress Notes (Signed)
Discharge instructions given. Pt verbalized understanding and all questions were answered.  

## 2014-03-31 NOTE — Progress Notes (Signed)
Weekend CSW provided patient with bus pass home as well as information on community resources.   Tilden Fossa, MSW, [place] Clinical Social Worker [facility] Emergency Dept. [phone]

## 2014-03-31 NOTE — Discharge Instructions (Signed)
Please keep your follow-up appointments; this is very important for your continued recovery.    We have made the following additions/changes to your medications:  Please discontinue mobic.   Take ibuprofen 616m three times daily for acute pericarditis. Take colchicine 0.612mdaily for 3 months for acute pericarditis.  Take pantoprazole 2061maily while taking ibuprofen.  May take hydroxyzine for the nodule on your right forehead if needed (do not exceed more than 4 doses per day).   Please continue to take all of your medications as prescribed.  Do not miss any doses without contacting your primary physician.  If you have questions, please contact your physician or contact the Internal Medicine Teaching Service at [phone] bring your medicications with you to your appointments; medications may be eye drops, herbals, vitamins, or pills.    If you believe you are suffering from a life-threatening emergency, go to the nearest Emergency Department.      Com[place]iz Cla[place]orne[place]  1) Find a DocCharity fundraiserthough you won't have to find out who is covered by youFPL Groupan, it is a good idea to ask around and get recommendations. You will then need to call the office and see if the doctor you have chosen will accept you as a new patient and what types of options they offer for patients who are self-pay. Some doctors offer discounts or will set up payment plans for their patients who do not have insurance, but you will need to ask so you aren't surprised when you get to your appointment.  2) Contact Your Local Health Department Not all health departments have doctors that can see patients for sick visits, but many do, so it is worth a call to see if yours does. If you don't know where your local health department is, you can check in your phone book. The CDC also has a tool to help you locate your state's health department, and many state websites also have listings of  all of their local health departments.  3) Find a [facility] your illness is not likely to be very severe or complicated, you may want to try a walk in clinic. These are popping up all over the country in pharmacies, drugstores, and shopping centers. They're usually staffed by nurse practitioners or physician assistants that have been trained to treat common illnesses and complaints. They're usually fairly quick and inexpensive. However, if you have serious medical issues or chronic medical problems, these are probably not your best option.  No Primary Care Doctor: - Call Health Connect at  [phone] can help you locate a primary care doctor that  accepts your insurance, provides certain services, etc. - Physician Referral Service- [phone] Pain Problems: Organization         Address  Phone   Notes  [phone] need to be referred by their primary care doctor.   Medication Assistance: Organization         Address  Phone   Notes  [facility] Centerdication [facility]1AbernathySuiNephiC [phone] be a resident of [facility] Must have NO insurance coverage whatsoever (no Medicaid/ Medicare, etc.) -- The pt. MUST have a primary care doctor that directs their care regularly and follows them in the community   MedAssist  [phone] Agencies  that provide inexpensive medical care: Organization         Address  Phone   Notes  [place]  [phone]   [facility]    [phone]   [facility] [place], [place] [address] [phone]   [place] [address], [place] [phone]   Planned Parenthood    [phone]   [facility]    [phone]   [place] and [place] [address],  [place] Phone:  [phone], Fax:  [phone] Hours of Operation:  9 am - 6 pm, M-F.  Also accepts Medicaid/Medicare and self-pay.  [facility] for [place], [address], [place] Phone: [phone], Fax: [phone]. Hours of Operation:  8:30 am - 5:30 pm, M-F.  Also accepts Medicaid and self-pay.  [facility] [place] [address], [place] Phone: [phone]   [place], [place], [place] [phone], Ext. [phone] Mondays & Thursdays: 7-9 AM.  First 15 patients are seen on a first come, first serve basis.    [place] Providers:  Organization         Address  Phone   Notes  [facility] [address], [place] [phone] Also accepts self-pay patients.  [facility] [address], [place]  [phone]   [place], [address], [place] [phone]   [facility] [address], [place] [phone]   Lucianne Lei [address], [place]   [phone] Only accepts [place] Access [place] patients after they have their name applied to their card.   Self-Pay (no insurance) in [facility]:  Organization         Address  Phone   Notes  Sickle Cell Patients, [facility] [place] [phone]   [facility] [place] [phone]   [facility] [place], [place], [place] [phone]   [facility]/Dr. Osei-Bonsu  [address] [place] or [address], [place] [phone] Phone number for both [place] and [place] locations is the same.  Urgent Medical and [facility] [address], [place] [phone]   [facility] [address] [place] or [address] [phone]   [facility] [address], [place] [phone], phone; [phone], fax Sees patients 1st and 3rd Saturday of every month.  Must not qualify for public or private insurance (i.e. Medicaid, Medicare, [place] Health Choice, Veterans' Benefits)  Household income should be no more than 200% of the poverty level The clinic cannot treat you if you are pregnant or think you are pregnant  Sexually transmitted diseases are not treated at the clinic.    Dental Care: Organization         Address  Phone  Notes  [facility] of [facility] [place] [phone] Accepts children up to age 6 who are enrolled in [place] or [place]; pregnant women with a Medicaid card; and children who have applied for Medicaid or [place]  Health Choice, but were declined, whose parents can pay a reduced fee at time of service.  [facility] Department of [facility]  [address], [place] [phone] Accepts children up to age 2 who are enrolled in [place] or [place]; pregnant women with a Medicaid card; and children who have applied for Medicaid or [place] Health Choice, but were declined, whose parents can pay a reduced fee at time of service.  [place] Adult Dental Access PROGRAM  [place] [phone] Patients are seen by appointment only. Walk-ins are not accepted. [place] will see patients 10 years of age and older. Monday - Tuesday (8am-5pm) Most Wednesdays (8:30-5pm) $30 per visit, cash only  [facility] Adult Dental Access PROGRAM  [address], [facility], [place] [phone] Patients are seen by appointment only. Walk-ins are not accepted. [place] will see patients 58 years of age and older. One Wednesday Evening (Monthly: Volunteer Based).  $30 per visit, cash only  [place]    [phone] for adults; Children under age 43, call Graduate Pediatric Dentistry at [phone]. Children aged 45-14, please call [phone] to request a pediatric application.  Dental services are provided in all areas of dental care including fillings, crowns and bridges, complete and partial dentures, implants, gum treatment, root canals, and extractions. Preventive care is also provided. Treatment is provided to both adults and children. Patients are selected via a lottery and there is often a waiting list.   [facility] [address] [place]  [phone] [email or web address]   [facility] [address] [place], [place] [phone], Ext. [phone] Second and Fourth Thursday of each month, opens at 6:30 AM; Clinic ends at 9 AM.  Patients are seen on a first-come first-served basis, and a limited number are seen during each clinic.   [facility]  [address] Hillard Danker [place], [place] [phone]   Eligibility Requirements You must have lived in [place], [place], or [place] counties for at least the last three months.   You cannot be eligible for state or federal sponsored [employer], including [employer], [place], or [employer].   You generally cannot be eligible for healthcare insurance through your employer.    How to apply: Eligibility screenings are held every Tuesday and Wednesday afternoon from 1:00 pm until 4:00 pm. You do not need an appointment for the interview!  [facility] [address], [place], [place]   [place]  [place] Department  [place]  [phone]    Behavioral Health Resources in the Community: Intensive Outpatient Programs Organization         Address  Phone  Notes  [place]. [address] [place], [place] [phone]   [facility] Outpatient [address], [place], [place]   ADS: Alcohol & Drug Svcs [address], [place], [place]   [place] [address]  [place], [place] or [phone]   Substance Abuse Resources Organization         Address  Phone  Notes  Alcohol and Drug Services  [phone]   [facility]  [phone]   [facility] [place]  [phone]   Chinita Pester  [phone]   Residential & Outpatient Substance Abuse Program  [phone]  Psychological Services Organization         Address  Phone  Notes  [facility] [place]  [place]  [phone]   [place] [address], [place] or [phone]    Mobile Crisis Teams Organization         Address  Phone  Notes  Therapeutic Alternatives, Mobile Crisis Care Unit  [phone]   Assertive Psychotherapeutic Services  [address] [place], [place]   [place] Levels [address], [place] [phone]    Self-Help/Support Groups Organization         Address  Phone             Notes  [place]. of [place] - variety of support groups  [place]oyd Call for more information  Narcotics Anonymous (NA), [facility] [address], [employer] [place]  2 meetings at this location   Special educational needs teacher         Address  Phone  Notes  ASAP Residential Treatment [place],    [place]  [phone]   [facility]  [address], [place] [address], [place], [place]   [place], [place] [phone] Admissions: 8am-3pm M-F  Incentives Substance [place] [address],    [place], [place] [phone]   [facility] [address] [place], [place], [place]   [facility] [address]  [place], [place]     Insight Programs - Intensive Outpatient [address] Kristeen Mans [address], [place], [place]   [facility] ([place] [place].,  [place], [place] [phone] or [phone]   Residential Treatment Services (RTS) [address], [place], [place] Accepts Medicaid  [facility] [place] [address],  [place] [phone] Substance Abuse/Addiction Treatment   [facility] Organization         Address  Phone  Notes  [facility]  [phone]   Domenic Schwab, PhD [address] Arlis Porta [place], [place]   [phone] or [phone]   [place], [place] [phone]   [facility] [address], [place], [place] [phone] Insurance/Medicaid/sponsorship through [facility] [place] and Families [address], [address] [place]                                    [place], [place] [phone] [place] [address], [place] [phone]    Dr. Adele Schilder  [phone]   [facility] Dept. 1) [address], [place] 2) [place] 3)  [place] [address], [place] [phone]  [phone]   [place] [phone] or [phone] (After Hours)

## 2014-03-31 NOTE — Progress Notes (Addendum)
Subjective:   Day of hospitalization: 2  VSS.  Although slightly tachycardic, tmax 98.4.  Pt admitted with dyspnea thought to be secondary to PNA but EKG and CP more c/w pericarditis.    She reports her CP has improved  She is eating well.  Denies any SOB or cough.  Also reports some nonpruritic "bumps" that have come up on her forehead.  No N/V/D/C.      Objective:   Vital signs in last 24 hours: Filed Vitals:   03/30/14 2026 03/30/14 2100 03/31/14 0125 03/31/14 0404  BP: 128/64   134/80  Pulse: 72 71  61  Temp: 98.4 F (36.9 C)   97.8 F (36.6 C)  TempSrc: Oral   Oral  Resp: 18 18  18   Height:      Weight:      SpO2: 99% 97% 98% 94%    Weight: Filed Weights   03/30/14 0335  Weight: 111 lb 8.8 oz (50.6 kg)    I/Os:  Intake/Output Summary (Last 24 hours) at 03/31/14 1236 Last data filed at 03/30/14 1830  Gross per 24 hour  Intake 1408.33 ml  Output      0 ml  Net 1408.33 ml    Physical Exam: Constitutional: Vital signs reviewed.  Patient is lying in bed in no acute distress and cooperative with exam.   HEENT: Howe/AT with 0.5cm nodule on her R forehead, conjunctivae normal, no scleral icterus  Cardiovascular: RRR, no MRG Pulmonary/Chest: normal respiratory effort, no accessory muscle use, CTAB, no wheezes, rales, or rhonchi Abdominal: Soft. +BS, NT/ND Neurological: A&O x3, CN II-XII grossly intact; non-focal exam Extremities: 2+DP b/l, no C/C/E  Skin: Warm, dry and intact.   Lab Results:  BMP:  Recent Labs Lab 03/30/14 0525 03/31/14 0337  NA 136* 144  K 3.8 4.4  CL 96 106  CO2 19 24  GLUCOSE 199* 118*  BUN 9 19  CREATININE 0.58 0.62  CALCIUM 9.7 9.2   Anion Gap:  21-->14  CBC:  Recent Labs Lab 03/29/14 2328 03/30/14 0525 03/31/14 0337  WBC 12.1* 14.4* 8.5  NEUTROABS 10.4* 13.1*  --   HGB 12.0 11.5* 10.5*  HCT 35.1* 34.5* 30.9*  MCV 84.2 85.6 84.0  PLT 205 200 182   Coagulation: No results found for this basename: LABPROT, INR,   in the last 168 hours  CBG:          Recent Labs Lab 03/30/14 0345  GLUCAP 203*          Lactic Acid/Procalcitonin:  Recent Labs Lab 03/29/14 2343 03/30/14 0830  LATICACIDVEN 0.89  --   PROCALCITON  --  2.08   Cardiac Enzymes:  Recent Labs Lab 03/31/14 0010 03/31/14 0337 03/31/14 0745  TROPONINI <0.30 <0.30 <0.30   BNP: No results found for this basename: PROBNP,  in the last 168 hours  D-Dimer: No results found for this basename: DDIMER,  in the last 168 hours  Urinalysis:  Recent Labs Lab 03/30/14 0402  COLORURINE YELLOW  LABSPEC 1.010  PHURINE 6.5  GLUCOSEU 250*  HGBUR SMALL*  BILIRUBINUR NEGATIVE  KETONESUR NEGATIVE  PROTEINUR NEGATIVE  UROBILINOGEN 1.0  NITRITE NEGATIVE  LEUKOCYTESUR NEGATIVE    Micro Results: No results found for this or any previous visit (from the past 240 hour(s)).  Blood Culture:    Component Value Date/Time   SDES URINE, RANDOM 03/30/2014 0402   SPECREQUEST NONE 03/30/2014 0402   CULT  Value: DIPHTHEROIDS(CORYNEBACTERIUM SPECIES) Note: Standardized susceptibility testing for  this organism is not available. Performed at [facility] 10/11/2013 0302   REPTSTATUS 03/30/2014 FINAL 03/30/2014 0402    Studies/Results: Dg Chest 2 View  03/29/2014   CLINICAL DATA:  chest pain  EXAM: CHEST  2 VIEW  COMPARISON:  Two-view chest 02/17/2014.  FINDINGS: Low lung volumes. Discoid atelectasis versus scarring right mid hemi thorax, stable symmetric smoothly marginated rounded densities at the lung bases consistent with nipple shadows. Linear areas of density, mild, within the lung bases. The opacity in the left lung base and right perihilar region have decreased. No new focal regions of consolidation. No acute osseous abnormalities.  IMPRESSION: Resolving infiltrates right perihilar and left lung base.  Minimal scarring versus atelectasis within the lung bases  Discoid atelectasis versus scar right mid hemithorax.   Electronically Signed    By: Margaree Mackintosh M.D.   On: 03/29/2014 13:27    Medications:  Scheduled Meds: . buPROPion  100 mg Oral QAC breakfast  . colchicine  0.6 mg Oral Daily  . enoxaparin (LOVENOX) injection  40 mg Subcutaneous Daily  . gabapentin  300 mg Oral TID  . ibuprofen  600 mg Oral TID  . ipratropium-albuterol  3 mL Nebulization BID  . lisinopril  20 mg Oral Daily  . mirtazapine  7.5 mg Oral QHS  . risperiDONE  0.25 mg Oral BID  . traZODone  50 mg Oral QHS   Continuous Infusions:  PRN Meds: benzonatate, hydrOXYzine  Antibiotics: Antibiotics Given (last 72 hours)   Date/Time Action Medication Dose Rate   03/30/14 1302 Given   cefTRIAXone (ROCEPHIN) 1 g in dextrose 5 % 50 mL IVPB 1 g 100 mL/hr   03/30/14 2137 Given   azithromycin (ZITHROMAX) 500 mg in dextrose 5 % 250 mL IVPB 500 mg 250 mL/hr      Day of Hospitalization: 2  Consults:    Assessment/Plan:   Principal Problem:   Sepsis Active Problems:   SUBSTANCE ABUSE, MULTIPLE   ESSENTIAL HYPERTENSION   Tobacco abuse   Pneumonia   CAP (community acquired pneumonia)   Acute pericarditis  Acute pericarditis Pt with pleuritic CP, TTP, and non-exertional.  EKG with diffuse ST elevation and PR depression.  Trops x 6 neg. She has a h/o prior cocaine abuse.  We reviewed the EKG with Dr. Angelena Form (cardiology) who agrees that clinical scenario and the EKG is likely pericarditis.  Repeat EKG appears slightly improved.  TTE shows a LVEF of 60-65% with grade 2 diastolic dysfunction and a trivial pericardial effusion which was also present on last TTE in 2013.   -continue colchicine for 3 months and ibuprofen for 2 weeks  -add PPI for prophylaxis  Sepsis, resolved  Afebrile with leukocytosis resolved.   -d/c aithromycin and ceftriaxone for CAP  -duonebs  -tessalon for cough  -d/c IVF at 182m/h  Major depressive disorder -continue trazodone, wellbutrin, remeron, risperdal   HTN Stable  -continue lisinopril   Nodule on right  temple Nonpruritic nodule on the right temple ~1cm.  Likely drug reaction. -monitor -hydroxyzine   Social issues Pt lives in homeless shelter and has difficulty affording medications.  -consult to care management  F/E/N Fluids- d/c NS 1051mh Electrolytes- Replete as needed  Nutrition- Regular diet   VTE PPx  lovenox 4047mQ qd   Disposition Anticipated discharge today.     LOS: 2 days   JacJones BalesD PGY-1, Internal Medicine Teaching Service [phone]-5PM Mon-Fri) 03/31/2014, 12:36 PM

## 2014-04-01 NOTE — ED Provider Notes (Signed)
Medical screening examination/treatment/procedure(s) were performed by non-physician practitioner and as supervising physician I was immediately available for consultation/collaboration.   EKG Interpretation None       Babette Relic, MD 04/01/14 2105

## 2014-04-02 NOTE — ED Provider Notes (Signed)
Medical screening examination/treatment/procedure(s) were performed by non-physician practitioner and as supervising physician I was immediately available for consultation/collaboration.   EKG Interpretation   Date/Time:  Thursday March 29 2014 12:01:38 EDT Ventricular Rate:  93 PR Interval:  136 QRS Duration: 82 QT Interval:  374 QTC Calculation: 465 R Axis:   77 Text Interpretation:  Normal sinus rhythm Possible Left atrial enlargement  Left ventricular hypertrophy Nonspecific T wave abnormality Prolonged QT  Abnormal ECG ED PHYSICIAN INTERPRETATION AVAILABLE IN [facility]  Confirmed by TEST, Record ([id number]) on 03/31/2014 8:45:17 AM        Tanna Furry, MD 04/02/14 2143

## 2014-04-03 NOTE — Discharge Summary (Signed)
Name: [name] MRN: [id number] DOB: 1961/02/04 53 y.o. PCP: Arman Filter, MD __________________________________________________  Date of Admission: 03/29/2014  9:06 PM Date of Discharge: 04/03/2014 Attending Physician: Dr. Sid Falcon, MD   Discharge Diagnosis: Acute pericarditis  Sepsis, resolved  Major depressive disorder  HTN  Nodule on right temple  Social issues    Discharge Medications:   Medication List    STOP taking these medications       amoxicillin-clavulanate 875-125 MG per tablet  Commonly known as:  AUGMENTIN     meloxicam 7.5 MG tablet  Commonly known as:  MOBIC      TAKE these medications       buPROPion 100 MG tablet  Commonly known as:  WELLBUTRIN  Take 1 tablet (100 mg total) by mouth daily before breakfast. For depression     colchicine 0.6 MG tablet  Take 1 tablet (0.6 mg total) by mouth daily.     gabapentin 300 MG capsule  Commonly known as:  NEURONTIN  Take 1 capsule (300 mg total) by mouth 3 (three) times daily. For substance withdrawal syndrome     guaiFENesin-codeine 100-10 MG/5ML syrup  Take 10 mLs by mouth 3 (three) times daily as needed for cough.     hydrOXYzine 25 MG tablet  Commonly known as:  ATARAX/VISTARIL  Take 25 mg (1 tablet) four times daily as needed: For anxiety/tension     ibuprofen 600 MG tablet  Commonly known as:  ADVIL,MOTRIN  Take 1 tablet (600 mg total) by mouth 3 (three) times daily.     lisinopril 20 MG tablet  Commonly known as:  PRINIVIL,ZESTRIL  Take 1 tablet (20 mg total) by mouth daily. For high blood pressure     mirtazapine 7.5 MG tablet  Commonly known as:  REMERON  Take 1 tablet (7.5 mg total) by mouth at bedtime. For sleep     pantoprazole 20 MG tablet  Commonly known as:  PROTONIX  Take 1 tablet (67m) daily while taking ibuprofen.     risperiDONE 0.25 MG tablet  Commonly known as:  RISPERDAL  Take 1 tablet (0.25 mg total) by mouth 2 (two) times daily. For mood control      silver sulfADIAZINE 1 % cream  Commonly known as:  SILVADENE  Apply 1 application topically 2 (two) times daily.     traZODone 50 MG tablet  Commonly known as:  DESYREL  Take 1 tablet (50 mg total) by mouth at bedtime. For sleep        Disposition and follow-up:   [facility]  Please address the following problems post-discharge:  1.Resolution of pericarditis, chest pain, compliance with colchicine and ibuprofen  2.Resolution of nodule of the right temple  3.Social issues of homelessness    Labs / imaging needed at time of follow-up: BMP, cbc, ESR, ANA, Quantiferon, HIV (canceled during hospitalization)   Pending labs/ test needing follow-up: None  Follow-up Appointments: Follow-up Information   [facility] to schedule[facility]    Contact information:   [id number]     Discharge Instructions: Discharge Instructions   Call MD for:  difficulty breathing, headache or visual disturbances    Complete by:  As directed      Call MD for:  persistant dizziness or light-headedness    Complete by:  As directed      Call MD for:  severe uncontrolled pain    Complete by:  As directed      Call MD for:  temperature >100.4    Complete by:  As directed      Diet - low sodium heart healthy    Complete by:  As directed      Increase activity slowly    Complete by:  As directed            Consultations:  None  Procedures Performed:  Dg Chest 2 View  03/29/2014   CLINICAL DATA:  chest pain  EXAM: CHEST  2 VIEW  COMPARISON:  Two-view chest 02/17/2014.  FINDINGS: Low lung volumes. Discoid atelectasis versus scarring right mid hemi thorax, stable symmetric smoothly marginated rounded densities at the lung bases consistent with nipple shadows. Linear areas of density, mild, within the  lung bases. The opacity in the left lung base and right perihilar region have decreased. No new focal regions of consolidation. No acute osseous abnormalities.  IMPRESSION: Resolving infiltrates right perihilar and left lung base.  Minimal scarring versus atelectasis within the lung bases  Discoid atelectasis versus scar right mid hemithorax.   Electronically Signed   By: Margaree Mackintosh M.D.   On: 03/29/2014 13:27    2D Echo:  Impressions: -Normal LV size and systolic function, EF 59-47%. Moderate diastolic dysfunction. Normal RV size and systolic function. Trivial posterior pericardial effusion.  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 She was seen in ED last month and prescribed azithromycin for CAP. She does not remember being this sick last month but says she completed the antibiotic she was given. She initially came to the ED earlier today with cough productive of green mucous and chills for the past 2-3 days. She was evaluated and provided with antibiotic rx but she was unable to fill rx she received today due to cost. She now returns 2/2 to dyspnea. She lives in a shelter and one of the other residents was recently in the hospital and treated for pneumonia. She has a good appetite, denies N/V, diarrhea or dysuria. She has some pleuritic chest pain with breathing and coughing. She says she quit cocaine 2 months ago but continues to smoke 3 cigarrettes/per day and marijuana daily. She denies EtOH or IVDU.  In the ED: Tmax: 100.54F, RR 18-27, SpO2 98-100%, HR 103-123, BP 153-186/76-165mHg; she received 1L bolus, Decadron, azithromycin, duonebs and ibuprofen.   ADuwaine Maxin [name] by problem list: Principal Problem:   Sepsis Active Problems:   SUBSTANCE ABUSE, MULTIPLE   ESSENTIAL HYPERTENSION   Tobacco abuse    Pneumonia   CAP (community acquired pneumonia)   Acute pericarditis   Acute pericarditis  Pt p/w pleuritic CP, TTP, and non-exertional. EKG with diffuse ST elevation and PR depression. Trops x 6 neg. She has a h/o prior cocaine abuse. We reviewed the EKG with Dr. MAngelena Form(cardiology) who agrees that clinical scenario and the EKG is likely pericarditis. ESR elevated at 99.  CRP elevated at 18.7.  Repeat EKG appears slightly improved. Unclear etiology, likely viral.  However, ANA, quantiferon, and HIV were ordered but were canceled in Epic and will need to be completed at hospital follow-up.  TTE shows a LVEF of 60-65% with grade 2 diastolic dysfunction  and a trivial pericardial effusion which was also present on last TTE in 2013.  She was started on colchicine which was started at 0.29m which should be continued for 3 months (03/30/14-06/30/14), ibuprofen 6069mtid for 2 weeks (03/30/14-04/12/14).  She was also started on protonix 2039maily for prophylaxis.  [facility]  Sepsis, resolved  Pt initially p/w fever/chills, cough.  She was started on ceftriaxone and azithromycin for CAP.  CXR showed resolving infiltrates.  CAP coverage was d/c due to pt being afebrile and resolving leukocytosis.  Also received duonebs, tessalon for cough.     Major depressive disorder  Stable.  We continued trazodone, wellbutrin, remeron, risperdal.    HTN  Stable.  We continued lisinopril.    Nodule on right temple  Nonpruritic nodule on the right temple ~1cm. Likely drug reaction.  She is already prescribed hydroxyzine which she is able to take if this is bothersome.   Social issues  Pt lives in homeless shelter and has difficulty affording medications.  We consulted care management for assistance with medications.   Discharge Vitals:   BP 135/76  Pulse 76  Temp(Src) 97.6 F (36.4 C) (Oral)  Resp 18  Ht _0  (1.575 m)  Wt 111 lb 8.8 oz (50.6 kg)  BMI 20.40 kg/m2  SpO2  99%   Signed: JacJones BalesD 04/03/2014, 7:34 PM   Services Ordered on Discharge: None Equipment Ordered on Discharge: None

## 2014-04-04 ENCOUNTER — Encounter: Payer: Self-pay | Admitting: Internal Medicine

## 2014-04-04 NOTE — Discharge Summary (Signed)
[name] was discharged over the weekend, I did not see her.  I did assist with the discharge planning.

## 2014-04-05 LAB — CULTURE, BLOOD (ROUTINE X 2)
CULTURE: NO GROWTH
Culture: NO GROWTH

## 2014-04-11 ENCOUNTER — Encounter: Payer: Self-pay | Admitting: Internal Medicine

## 2014-04-11 ENCOUNTER — Ambulatory Visit (INDEPENDENT_AMBULATORY_CARE_PROVIDER_SITE_OTHER): Payer: Medicaid Other | Admitting: Internal Medicine

## 2014-04-11 ENCOUNTER — Encounter: Payer: Self-pay | Admitting: Licensed Clinical Social Worker

## 2014-04-11 VITALS — BP 124/77 | HR 78 | Temp 97.0°F | Wt 120.7 lb

## 2014-04-11 DIAGNOSIS — I309 Acute pericarditis, unspecified: Secondary | ICD-10-CM

## 2014-04-11 DIAGNOSIS — F331 Major depressive disorder, recurrent, moderate: Secondary | ICD-10-CM

## 2014-04-11 DIAGNOSIS — Z1211 Encounter for screening for malignant neoplasm of colon: Secondary | ICD-10-CM

## 2014-04-11 DIAGNOSIS — F1420 Cocaine dependence, uncomplicated: Secondary | ICD-10-CM

## 2014-04-11 DIAGNOSIS — F1421 Cocaine dependence, in remission: Secondary | ICD-10-CM

## 2014-04-11 DIAGNOSIS — Z1239 Encounter for other screening for malignant neoplasm of breast: Secondary | ICD-10-CM

## 2014-04-11 DIAGNOSIS — F17200 Nicotine dependence, unspecified, uncomplicated: Secondary | ICD-10-CM

## 2014-04-11 DIAGNOSIS — I10 Essential (primary) hypertension: Secondary | ICD-10-CM

## 2014-04-11 DIAGNOSIS — Z720 Tobacco use: Secondary | ICD-10-CM

## 2014-04-11 DIAGNOSIS — R160 Hepatomegaly, not elsewhere classified: Secondary | ICD-10-CM | POA: Insufficient documentation

## 2014-04-11 DIAGNOSIS — F339 Major depressive disorder, recurrent, unspecified: Secondary | ICD-10-CM

## 2014-04-11 LAB — CBC WITH DIFFERENTIAL/PLATELET
BASOS PCT: 0 % (ref 0–1)
Basophils Absolute: 0 10*3/uL (ref 0.0–0.1)
Eosinophils Absolute: 0.1 10*3/uL (ref 0.0–0.7)
Eosinophils Relative: 1 % (ref 0–5)
HCT: 36.7 % (ref 36.0–46.0)
HEMOGLOBIN: 12.7 g/dL (ref 12.0–15.0)
LYMPHS ABS: 2.2 10*3/uL (ref 0.7–4.0)
LYMPHS PCT: 25 % (ref 12–46)
MCH: 28.9 pg (ref 26.0–34.0)
MCHC: 34.6 g/dL (ref 30.0–36.0)
MCV: 83.4 fL (ref 78.0–100.0)
Monocytes Absolute: 0.3 10*3/uL (ref 0.1–1.0)
Monocytes Relative: 4 % (ref 3–12)
NEUTROS ABS: 6.1 10*3/uL (ref 1.7–7.7)
NEUTROS PCT: 70 % (ref 43–77)
Platelets: 257 10*3/uL (ref 150–400)
RBC: 4.4 MIL/uL (ref 3.87–5.11)
RDW: 15.1 % (ref 11.5–15.5)
WBC: 8.7 10*3/uL (ref 4.0–10.5)

## 2014-04-11 LAB — COMPLETE METABOLIC PANEL WITH GFR
ALK PHOS: 83 U/L (ref 39–117)
ALT: 12 U/L (ref 0–35)
AST: 17 U/L (ref 0–37)
Albumin: 3.9 g/dL (ref 3.5–5.2)
BUN: 15 mg/dL (ref 6–23)
CO2: 27 mEq/L (ref 19–32)
Calcium: 9.6 mg/dL (ref 8.4–10.5)
Chloride: 106 mEq/L (ref 96–112)
Creat: 0.86 mg/dL (ref 0.50–1.10)
GFR, EST NON AFRICAN AMERICAN: 79 mL/min
GFR, Est African American: >89 mL/min
GLUCOSE: 70 mg/dL (ref 70–99)
POTASSIUM: 4.1 meq/L (ref 3.5–5.3)
Sodium: 141 mEq/L (ref 135–145)
Total Bilirubin: 0.3 mg/dL (ref 0.2–1.2)
Total Protein: 7 g/dL (ref 6.0–8.3)

## 2014-04-11 LAB — SEDIMENTATION RATE: SED RATE: 23 mm/h — AB (ref 0–22)

## 2014-04-11 MED ORDER — GABAPENTIN 300 MG PO CAPS: 300.0000 mg | ORAL_CAPSULE | Freq: Three times a day (TID) | ORAL | Status: DC

## 2014-04-11 MED ORDER — GUAIFENESIN 100 MG/5ML PO SOLN: 10.0000 mL | ORAL | Status: DC | PRN

## 2014-04-11 NOTE — Addendum Note (Signed)
Addended by: Charlesetta Shanks on: 04/11/2014 03:03 PM   Modules accepted: Orders

## 2014-04-11 NOTE — Progress Notes (Signed)
Subjective:    [name], female    DOB: [birth year], 53 y.o.   MRN: [id number]  HPI Comments: Ms. 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  She reports getting really sick and having trouble breathing before being hospitalized on 03/29/14.  She says she received breathing treatments and antibiotics.  She says she feels good now, but she is having diarrhea frequently.  She reports going to the bathroom every two hours.  She is not currently having any chest pain and denies trouble breathing.  She says she has been keeping up with the colchicine and ibuprofen.  She says the nodule on her left temple has resolved as well.  She is currently living at the [facility], and she is hoping to get a studio apartment in a couple of weeks.  She needs a refill of her cough syrup and her Neurontin.  She has had a cough for several months that is occasionally productive.  She relates this from being outside and at homeless shelters.  She uses the Neurontin for pain in her legs and headaches.   Diarrhea  Associated symptoms include arthralgias, chills, coughing, a fever, headaches and myalgias.      Review of Systems  Constitutional: Positive for fever, chills and diaphoresis. Negative for activity change, appetite change, fatigue and unexpected weight change.  HENT: Positive for congestion and rhinorrhea.   Eyes: Negative for visual disturbance.  Respiratory: Positive for cough. Negative for choking, chest tightness, shortness of breath and wheezing.   Cardiovascular: Negative for chest pain, palpitations and leg swelling.  Gastrointestinal: Positive for diarrhea. Negative for constipation and blood in stool.  Endocrine: Negative for polyuria.  Genitourinary: Negative for dysuria, hematuria and difficulty urinating.    Musculoskeletal: Positive for arthralgias, joint swelling and myalgias.  Skin: Positive for rash (Some bumps on elbow.).  Neurological: Positive for dizziness, light-headedness and headaches.  Hematological: Negative for adenopathy.  Psychiatric/Behavioral: Negative for suicidal ideas, hallucinations, sleep disturbance and agitation.       Objective:   Physical Exam  Constitutional: She is oriented to person, place, and time. No distress.  HENT:  Head: Normocephalic.  Mouth/Throat: Oropharynx is clear and moist. No oropharyngeal exudate.  Scabbed and dusky ear pinnas bilaterally.  Edentulous.  Eyes: Conjunctivae and EOM are normal. Pupils are equal, round, and reactive to light. Right eye exhibits no discharge. Left eye exhibits no discharge. No scleral icterus.  Neck: Normal range of motion. Neck supple. No tracheal deviation present. No thyromegaly present.  Cardiovascular: Normal rate, regular rhythm and intact distal pulses.  Exam reveals no gallop and no friction rub.   No murmur heard. Pulmonary/Chest: Effort normal and breath sounds normal. No respiratory distress. She has no wheezes. She has no rales.  Abdominal: Soft. Bowel sounds are normal. She exhibits no distension. There is tenderness (RUQ with deep palpation.). There is no rebound and no guarding.  Hepatomegaly, 3 cm below costal margin, tender to palpation.  Musculoskeletal: Normal range of motion. She exhibits no edema and no tenderness.  Lymphadenopathy:    She has no cervical adenopathy.  Neurological: She is alert and oriented to person, place, and time. No cranial nerve deficit. Coordination normal.  Skin: Skin is warm and dry. No rash noted. She is not diaphoretic.  Psychiatric: She has a normal mood and affect.  Assessment & Plan:  Please see problem-based assessment and plan.

## 2014-04-11 NOTE — Assessment & Plan Note (Signed)
Well-controlled and good compliance. -Continue Lisinopril 20 mg daily.

## 2014-04-11 NOTE — Assessment & Plan Note (Addendum)
Chest pain and shortness of breath have resolved and patient has been compliant with treatments.  She reports frequent diarrhea, likely due to the colchicine.  The cause of her pericarditis is unknown at this time, and her workup inpatient was deferred.  We will complete that at this time.  She does report fevers, chills, and night sweats, so will rule out TB or HIV infection. -Stop colchicine due to diarrhea. -Continue ibuprofen 600 mg until tomorrow. -CMP, CBC, ESR, ANA, HIV, quantiferon.

## 2014-04-11 NOTE — Assessment & Plan Note (Addendum)
Says Wellbutrin has been helping, and her mood has been good.  Excited about the possibility of getting her own apartment in a couple of weeks and working toward that goal. -Continue Wellbutrin 100 mg daily.

## 2014-04-11 NOTE — Assessment & Plan Note (Addendum)
Cutting back on smoking, currently using 3-4 cigarettes per day.  Offered assistance, but patient would like to continue cutting back on her own. -Encouraged continued reduction of smoking.

## 2014-04-11 NOTE — Assessment & Plan Note (Signed)
Incidentally noted hepatomegaly and tenderness to palpation in RUQ today on exam.  Underlying cause unclear but may be related to drug abuse, but will check hepatitis panel today.  Reports remote alcohol use but none currently. -CMP and hepatitis panel today.

## 2014-04-11 NOTE — Progress Notes (Signed)
Pt requesting bus pass for transportation home from today's appointment.  Pt has Medicaid and eligible for Medicaid medical transportation, utilized bus pass and verification form completed.  Pt provided with [employer] brochure.

## 2014-04-11 NOTE — Assessment & Plan Note (Addendum)
Reports no cocaine in last month and plans to continue to avoid using.  She has generalized pain that she describes as aching and is likely related to withdrawal.  She has been taking gabapentin 300 mg daily at bed time with relief. -Applauded abstaining from cocaine. -Refilled gabapentin 300 mg TID and encouraged patient to use up to three times per day if needed for pain.

## 2014-04-11 NOTE — Patient Instructions (Signed)
Thank you for coming to clinic today [name].  General instructions: -Please stop your colchicine because of the diarrhea. -If you continue to have diarrhea, you can take Imodium over the counter. Make sure you continue to drink lots of fluid to avoid getting dehydrated. -Continue to take your ibuprofen until tomorrow.  You can stop it at that time. -We are going to check some labs today to look for the cause of the heart inflammation that caused your hospitalization. -I sent your refills for your cough medication and the Neurontin to your pharmacy. -Schedule a follow up appointment for 2 months from now and let us know if your diarrhea gets worse or your chest pain comes back.  Please try to bring all your medicines next time. This helps [place] take good care of you and stops mistakes from medicines that could hurt you.

## 2014-04-11 NOTE — Progress Notes (Signed)
I would also add CRP in the blood work along with ESR.  I saw and evaluated the patient.  I personally confirmed the key portions of the history and exam documented by Dr. Trudee Kuster and I reviewed pertinent patient test results.  The assessment, diagnosis, and plan were formulated together and I agree with the documentation in the resident's note.

## 2014-04-12 LAB — HIV-1 RNA QUANT-NO REFLEX-BLD
HIV 1 RNA Quant: <20 copies/mL (ref <20)
HIV-1 RNA Quant, Log: <1.3 {Log} (ref <1.30)

## 2014-04-12 LAB — C-REACTIVE PROTEIN: CRP: 0.6 mg/dL — AB (ref <0.60)

## 2014-04-12 LAB — HEPATITIS PANEL, ACUTE
HCV Ab: NEGATIVE
Hep A IgM: NONREACTIVE
Hep B C IgM: NONREACTIVE
Hepatitis B Surface Ag: NEGATIVE

## 2014-04-12 LAB — ANA: Anti Nuclear Antibody(ANA): NEGATIVE

## 2014-04-12 LAB — HIV ANTIBODY (ROUTINE TESTING W REFLEX): HIV: NONREACTIVE

## 2014-04-13 LAB — QUANTIFERON TB GOLD ASSAY (BLOOD)
Interferon Gamma Release Assay: NEGATIVE
Mitogen value: 0.77 IU/mL
Quantiferon Nil Value: 0.02 IU/mL
Quantiferon Tb Ag Minus Nil Value: 0 IU/mL
TB AG VALUE: 0.02 [IU]/mL

## 2014-04-16 ENCOUNTER — Emergency Department (HOSPITAL_COMMUNITY)
Admission: EM | Admit: 2014-04-16 | Discharge: 2014-04-16 | Disposition: A | Discharge status: Home / Self Care | Payer: Medicaid Other | Attending: Emergency Medicine | Admitting: Emergency Medicine

## 2014-04-16 ENCOUNTER — Emergency Department (HOSPITAL_COMMUNITY): Payer: Medicaid Other

## 2014-04-16 ENCOUNTER — Encounter (HOSPITAL_COMMUNITY): Payer: Self-pay | Admitting: Emergency Medicine

## 2014-04-16 DIAGNOSIS — M79609 Pain in unspecified limb: Secondary | ICD-10-CM | POA: Insufficient documentation

## 2014-04-16 DIAGNOSIS — F3289 Other specified depressive episodes: Secondary | ICD-10-CM | POA: Insufficient documentation

## 2014-04-16 DIAGNOSIS — M79605 Pain in left leg: Secondary | ICD-10-CM

## 2014-04-16 DIAGNOSIS — F329 Major depressive disorder, single episode, unspecified: Secondary | ICD-10-CM | POA: Insufficient documentation

## 2014-04-16 DIAGNOSIS — I10 Essential (primary) hypertension: Secondary | ICD-10-CM | POA: Diagnosis not present

## 2014-04-16 DIAGNOSIS — M79671 Pain in right foot: Secondary | ICD-10-CM

## 2014-04-16 DIAGNOSIS — Z8739 Personal history of other diseases of the musculoskeletal system and connective tissue: Secondary | ICD-10-CM | POA: Diagnosis not present

## 2014-04-16 DIAGNOSIS — R1011 Right upper quadrant pain: Secondary | ICD-10-CM | POA: Insufficient documentation

## 2014-04-16 DIAGNOSIS — M79672 Pain in left foot: Secondary | ICD-10-CM

## 2014-04-16 DIAGNOSIS — R197 Diarrhea, unspecified: Secondary | ICD-10-CM | POA: Diagnosis not present

## 2014-04-16 DIAGNOSIS — R1031 Right lower quadrant pain: Secondary | ICD-10-CM | POA: Diagnosis not present

## 2014-04-16 DIAGNOSIS — F17200 Nicotine dependence, unspecified, uncomplicated: Secondary | ICD-10-CM | POA: Diagnosis not present

## 2014-04-16 DIAGNOSIS — M79604 Pain in right leg: Secondary | ICD-10-CM

## 2014-04-16 LAB — CBC WITH DIFFERENTIAL/PLATELET
Basophils Absolute: 0 10*3/uL (ref 0.0–0.1)
Basophils Relative: 0 % (ref 0–1)
Eosinophils Absolute: 0.1 10*3/uL (ref 0.0–0.7)
Eosinophils Relative: 1 % (ref 0–5)
HCT: 32.7 % — ABNORMAL LOW (ref 36.0–46.0)
Hemoglobin: 11.2 g/dL — ABNORMAL LOW (ref 12.0–15.0)
Lymphocytes Relative: 25 % (ref 12–46)
Lymphs Abs: 1.8 10*3/uL (ref 0.7–4.0)
MCH: 29.2 pg (ref 26.0–34.0)
MCHC: 34.3 g/dL (ref 30.0–36.0)
MCV: 85.4 fL (ref 78.0–100.0)
Monocytes Absolute: 0.6 10*3/uL (ref 0.1–1.0)
Monocytes Relative: 8 % (ref 3–12)
Neutro Abs: 4.8 10*3/uL (ref 1.7–7.7)
Neutrophils Relative %: 66 % (ref 43–77)
PLATELETS: 229 10*3/uL (ref 150–400)
RBC: 3.83 MIL/uL — ABNORMAL LOW (ref 3.87–5.11)
RDW: 14 % (ref 11.5–15.5)
WBC: 7.2 10*3/uL (ref 4.0–10.5)

## 2014-04-16 LAB — URINE MICROSCOPIC-ADD ON

## 2014-04-16 LAB — COMPREHENSIVE METABOLIC PANEL
ALK PHOS: 99 U/L (ref 39–117)
ALT: 10 U/L (ref 0–35)
AST: 16 U/L (ref 0–37)
Albumin: 3.4 g/dL — ABNORMAL LOW (ref 3.5–5.2)
Anion gap: 16 — ABNORMAL HIGH (ref 5–15)
BUN: 12 mg/dL (ref 6–23)
CALCIUM: 9.3 mg/dL (ref 8.4–10.5)
CO2: 23 meq/L (ref 19–32)
Chloride: 100 mEq/L (ref 96–112)
Creatinine, Ser: 0.7 mg/dL (ref 0.50–1.10)
Glucose, Bld: 99 mg/dL (ref 70–99)
Potassium: 3.5 mEq/L — ABNORMAL LOW (ref 3.7–5.3)
SODIUM: 139 meq/L (ref 137–147)
Total Bilirubin: 0.2 mg/dL — ABNORMAL LOW (ref 0.3–1.2)
Total Protein: 7.5 g/dL (ref 6.0–8.3)

## 2014-04-16 LAB — URINALYSIS, ROUTINE W REFLEX MICROSCOPIC
BILIRUBIN URINE: NEGATIVE
GLUCOSE, UA: NEGATIVE mg/dL
KETONES UR: NEGATIVE mg/dL
Nitrite: NEGATIVE
PROTEIN: NEGATIVE mg/dL
Specific Gravity, Urine: 1.022 (ref 1.005–1.030)
Urobilinogen, UA: 0.2 mg/dL (ref 0.0–1.0)
pH: 7 (ref 5.0–8.0)

## 2014-04-16 LAB — TROPONIN I: Troponin I: <0.3 ng/mL (ref <0.30)

## 2014-04-16 LAB — LIPASE, BLOOD: Lipase: 19 U/L (ref 11–59)

## 2014-04-16 MED ORDER — HYDROCODONE-IBUPROFEN 7.5-200 MG PO TABS: 1.0000 | ORAL_TABLET | Freq: Four times a day (QID) | ORAL | Status: DC | PRN

## 2014-04-16 MED ORDER — MORPHINE SULFATE 4 MG/ML IJ SOLN
4.0000 mg | Freq: Once | INTRAMUSCULAR | Status: AC
  Administered 2014-04-16: 4 mg via INTRAMUSCULAR
  Filled 2014-04-16: qty 1

## 2014-04-16 MED ORDER — CEPHALEXIN 250 MG PO CAPS
500.0000 mg | ORAL_CAPSULE | Freq: Once | ORAL | Status: AC
  Administered 2014-04-16: 500 mg via ORAL
  Filled 2014-04-16: qty 2

## 2014-04-16 MED ORDER — CEPHALEXIN 500 MG PO CAPS: 500.0000 mg | ORAL_CAPSULE | Freq: Four times a day (QID) | ORAL | Status: DC

## 2014-04-16 NOTE — ED Notes (Signed)
MD at bedside. 

## 2014-04-16 NOTE — ED Notes (Signed)
Pt presents with red area behind left lower leg x yesterday. Area is warm to touch, painful on palpation. Pt denies injury. Pt also reports RLQ pain since yesterday, denies N/V but reports diarrhea. Denies urinary symptoms. Pt in NAD.

## 2014-04-16 NOTE — Discharge Instructions (Signed)
Please follow up with your primary care physician in 1-2 days. If you do not have one please call the [place] number listed above. Please take pain medication and/or muscle relaxants as prescribed and as needed for pain. Please do not drive on narcotic pain medication or on muscle relaxants. Please take your antibiotic until completion. Please read all discharge instructions and return precautions.    Abdominal Pain, Women Abdominal (stomach, pelvic, or belly) pain can be caused by many things. It is important to tell your doctor:  The location of the pain.  Does it come and go or is it present all the time?  Are there things that start the pain (eating certain foods, exercise)?  Are there other symptoms associated with the pain (fever, nausea, vomiting, diarrhea)? All of this is helpful to know when trying to find the cause of the pain. CAUSES   Stomach: virus or bacteria infection, or ulcer.  Intestine: appendicitis (inflamed appendix), regional ileitis (Crohn's disease), ulcerative colitis (inflamed colon), irritable bowel syndrome, diverticulitis (inflamed diverticulum of the colon), or cancer of the stomach or intestine.  Gallbladder disease or stones in the gallbladder.  Kidney disease, kidney stones, or infection.  Pancreas infection or cancer.  Fibromyalgia (pain disorder).  Diseases of the female organs:  Uterus: fibroid (non-cancerous) tumors or infection.  Fallopian tubes: infection or tubal pregnancy.  Ovary: cysts or tumors.  Pelvic adhesions (scar tissue).  Endometriosis (uterus lining tissue growing in the pelvis and on the pelvic organs).  Pelvic congestion syndrome (female organs filling up with blood just before the menstrual period).  Pain with the menstrual period.  Pain with ovulation (producing an egg).  Pain with an IUD (intrauterine device, birth control) in the uterus.  Cancer of the female organs.  Functional pain  (pain not caused by a disease, may improve without treatment).  Psychological pain.  Depression. DIAGNOSIS  Your doctor will decide the seriousness of your pain by doing an examination.  Blood tests.  X-rays.  Ultrasound.  CT scan (computed tomography, special type of X-ray).  MRI (magnetic resonance imaging).  Cultures, for infection.  Barium enema (dye inserted in the large intestine, to better view it with X-rays).  Colonoscopy (looking in intestine with a lighted tube).  Laparoscopy (minor surgery, looking in abdomen with a lighted tube).  Major abdominal exploratory surgery (looking in abdomen with a large incision). TREATMENT  The treatment will depend on the cause of the pain.   Many cases can be observed and treated at home.  Over-the-counter medicines recommended by your caregiver.  Prescription medicine.  Antibiotics, for infection.  Birth control pills, for painful periods or for ovulation pain.  Hormone treatment, for endometriosis.  Nerve blocking injections.  Physical therapy.  Antidepressants.  Counseling with a psychologist or psychiatrist.  Minor or major surgery. HOME CARE INSTRUCTIONS   Do not take laxatives, unless directed by your caregiver.  Take over-the-counter pain medicine only if ordered by your caregiver. Do not take aspirin because it can cause an upset stomach or bleeding.  Try a clear liquid diet (broth or water) as ordered by your caregiver. Slowly move to a bland diet, as tolerated, if the pain is related to the stomach or intestine.  Have a thermometer and take your temperature several times a day, and record it.  Bed rest and sleep, if it helps the pain.  Avoid sexual intercourse, if it causes pain.  Avoid stressful situations.  Keep your follow-up appointments and tests,  as your caregiver orders.  If the pain does not go away with medicine or surgery, you may try:  Acupuncture.  Relaxation exercises (yoga,  meditation).  Group therapy.  Counseling. SEEK MEDICAL CARE IF:   You notice certain foods cause stomach pain.  Your home care treatment is not helping your pain.  You need stronger pain medicine.  You want your IUD removed.  You feel faint or lightheaded.  You develop nausea and vomiting.  You develop a rash.  You are having side effects or an allergy to your medicine. SEEK IMMEDIATE MEDICAL CARE IF:   Your pain does not go away or gets worse.  You have a fever.  Your pain is felt only in portions of the abdomen. The right side could possibly be appendicitis. The left lower portion of the abdomen could be colitis or diverticulitis.  You are passing blood in your stools (bright red or black tarry stools, with or without vomiting).  You have blood in your urine.  You develop chills, with or without a fever.  You pass out. MAKE SURE YOU:   Understand these instructions.  Will watch your condition.  Will get help right away if you are not doing well or get worse. Document Released: 07/19/2007 Document Revised: 12/14/2011 Document Reviewed: 08/08/2009 [facility] Patient Information 2015 [place], [place]. This information is not intended to replace advice given to you by your health care provider. Make sure you discuss any questions you have with your health care provider. Leg Cramps Leg cramps that occur during exercise can be caused by poor circulation or dehydration. However, muscle cramps that occur at rest or during the night are usually not due to any serious medical problem. Heat cramps may cause muscle spasms during hot weather.  CAUSES There is no clear cause for muscle cramps. However, dehydration may be a factor for those who do not drink enough fluids and those who exercise in the heat. Imbalances in the level of sodium, potassium, calcium or magnesium in the muscle tissue may also be a factor. Some medications, such as water pills (diuretics), may cause loss of  chemicals that the body needs (like sodium and potassium) and cause muscle cramps. TREATMENT   Make sure your diet has enough fluids and essential minerals for the muscle to work normally.  Avoid strenuous exercise for several days if you have been having frequent leg cramps.  Stretch and massage the cramped muscle for several minutes.  Some medicines may be helpful in some patients with night cramps. Only take over-the-counter or prescription medicines as directed by your caregiver. SEEK IMMEDIATE MEDICAL CARE IF:   Your leg cramps become worse.  Your foot becomes cold, numb, or blue. Document Released: 10/29/2004 Document Revised: 12/14/2011 Document Reviewed: 10/16/2008 [facility] Patient Information 2015 [place], [place]. This information is not intended to replace advice given to you by your health care provider. Make sure you discuss any questions you have with your health care provider.   Peripheral Neuropathy Peripheral neuropathy is a type of nerve damage. It affects nerves that carry signals between the spinal cord and other parts of the body. These are called peripheral nerves. With peripheral neuropathy, one nerve or a group of nerves may be damaged.  CAUSES  Many things can damage peripheral nerves. For some people with peripheral neuropathy, the cause is unknown. Some causes include:  Diabetes. This is the most common cause of peripheral neuropathy.  Injury to a nerve.  Pressure or stress on a nerve that  lasts a long time.  Too little vitamin B. Alcoholism can lead to this.  Infections.  Autoimmune diseases, such as multiple sclerosis and systemic lupus erythematosus.  Inherited nerve diseases.  Some medicines, such as cancer drugs.  Toxic substances, such as lead and mercury.  Too little blood flowing to the legs.  Kidney disease.  Thyroid disease. SIGNS AND SYMPTOMS  Different people have different symptoms. The symptoms you have will depend on which of  your nerves is damaged. Common symptoms include:  Loss of feeling (numbness) in the feet and hands.  Tingling in the feet and hands.  Pain that burns.  Very sensitive skin.  Weakness.  Not being able to move a part of the body (paralysis).  Muscle twitching.  Clumsiness or poor coordination.  Loss of balance.  Not being able to control your bladder.  Feeling dizzy.  Sexual problems. DIAGNOSIS  Peripheral neuropathy is a symptom, not a disease. Finding the cause of peripheral neuropathy can be hard. To figure that out, your health care provider will take a medical history and do a physical exam. A neurological exam will also be done. This involves checking things affected by your brain, spinal cord, and nerves (nervous system). For example, your health care provider will check your reflexes, how you move, and what you can feel.  Other types of tests may also be ordered, such as:  Blood tests.  A test of the fluid in your spinal cord.  Imaging tests, such as CT scans or an MRI.  Electromyography (EMG). This test checks the nerves that control muscles.  Nerve conduction velocity tests. These tests check how fast messages pass through your nerves.  Nerve biopsy. A small piece of nerve is removed. It is then checked under a microscope. TREATMENT   Medicine is often used to treat peripheral neuropathy. Medicines may include:  Pain-relieving medicines. Prescription or over-the-counter medicine may be suggested.  Antiseizure medicine. This may be used for pain.  Antidepressants. These also may help ease pain from neuropathy.  Lidocaine. This is a numbing medicine. You might wear a patch or be given a shot.  Mexiletine. This medicine is typically used to help control irregular heart rhythms.  Surgery. Surgery may be needed to relieve pressure on a nerve or to destroy a nerve that is causing pain.  Physical therapy to help movement.  Assistive devices to help  movement. HOME CARE INSTRUCTIONS   Only take over-the-counter or prescription medicines as directed by your health care provider. Follow the instructions carefully for any given medicines. Do not take any other medicines without first getting approval from your health care provider.  If you have diabetes, work closely with your health care provider to keep your blood sugar under control.  If you have numbness in your feet:  Check every day for signs of injury or infection. Watch for redness, warmth, and swelling.  Wear padded socks and comfortable shoes. These help protect your feet.  Do not do things that put pressure on your damaged nerve.  Do not smoke. Smoking keeps blood from getting to damaged nerves.  Avoid or limit alcohol. Too much alcohol can cause a lack of B vitamins. These vitamins are needed for healthy nerves.  Develop a good support system. Coping with peripheral neuropathy can be stressful. Talk to a mental health specialist or join a support group if you are struggling.  Follow up with your health care provider as directed. SEEK MEDICAL CARE IF:   You have new  signs or symptoms of peripheral neuropathy.  You are struggling emotionally from dealing with peripheral neuropathy.  You have a fever. SEEK IMMEDIATE MEDICAL CARE IF:   You have an injury or infection that is not healing.  You feel very dizzy or begin vomiting.  You have chest pain.  You have trouble breathing. Document Released: 09/11/2002 Document Revised: 06/03/2011 Document Reviewed: 05/29/2013 [facility] Patient Information 2015 [place], [place]. This information is not intended to replace advice given to you by your health care provider. Make sure you discuss any questions you have with your health care provider.

## 2014-04-16 NOTE — ED Notes (Signed)
PT states her left calf and great toe are tender today and R leg has had knee pain for the past 2 days. PT states she couldn't walk on L leg and reports RUQ pain near ribcage. Reports hx of cocaine use, but denies use at present

## 2014-04-16 NOTE — ED Provider Notes (Signed)
CSN: [id number]     Arrival date & time 04/16/14  1800 History   First MD Initiated Contact with Patient 04/16/14 2034     Chief Complaint  Patient presents with  . Leg Pain  . Abdominal Pain     (Consider location/radiation/quality/duration/timing/severity/associated sxs/prior Treatment) HPI Comments: Patient is a 53 year old female past medical history significant for vasculitis, hypertension, history of cocaine abuse, tobacco abuse, depression, anemia, RA presenting to the emergency department for multiple complaints. Patient's first complaint is left lower leg and foot pain with "bumps" that started today. Alleviating factors: none. Aggravating factors: ambulation, palpation. Medications tried prior to arrival: Neurontin, Ibuprofen. Patient is also complaining about right upper quadrant abdominal pain that started yesterday. She states she has had multiple episodes of diarrhea, she has seen her PCP regarding the diarrhea, was told it was likely d/t the colchine and advised to stop taking it. Denies any fevers, chills, CP, SOB, nausea, vomiting. No abdominal surgical history.      Past Medical History  Diagnosis Date  . 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 Followed by Dr. Louanne Skye  . Hypertension   . Cocaine abuse     ongoing with resultant vaculitis.  . Tobacco abuse   . Depression   . Normocytic anemia     BL Hgb 9.8-12. Last anemia panel 04/2010 - showing Fe 19, ferritin 101.  Pt on monthly B12 injections  . Rheumatoid arthritis(714.0)     patient reported  . Inflammatory arthritis   . Headache(784.0)   . 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 Skin biopsy      bilateral shin nodules on 04/2010  . Hernia repair    . Irrigation and debridement abscess Bilateral 09/26/2013    Procedure: DEBRIDEMENT ULCERS BILATERAL THIGHS;  Surgeon: Gwenyth Ober, MD;  Location: [facility] OR;  Service: General;   Laterality: Bilateral;   Family History  Problem Relation Age of Onset  . Breast cancer Mother     Breast cancer  . Alcohol abuse Mother   . Colon cancer Maternal Aunt 1  . Alcohol abuse Father    History  Substance Use Topics  . Smoking status: Current Every Day Smoker -- 0.50 packs/day for 39 years    Types: Cigarettes  . Smokeless tobacco: Never Used     Comment: trying.  Cutting back 3-4 cigerettes  . Alcohol Use: No   OB History   Grav Para Term Preterm Abortions TAB SAB Ect Mult Living                 Review of Systems  Respiratory: Negative for shortness of breath.   Cardiovascular: Negative for chest pain.  Gastrointestinal: Positive for abdominal pain and diarrhea. Negative for nausea, vomiting, constipation, blood in stool, abdominal distention and anal bleeding.  Musculoskeletal: Positive for arthralgias and myalgias.  All other systems reviewed and are negative.     Allergies  Acetaminophen  Home Medications   Prior to Admission medications   Medication Sig Start Date End Date Taking? Authorizing Provider  buPROPion (WELLBUTRIN) 100 MG tablet Take 1 tablet (100 mg total) by mouth daily before breakfast. For depression 01/24/14  Yes Encarnacion Slates, NP  gabapentin (NEURONTIN) 300 MG capsule Take 1 capsule (300 mg total) by mouth 3 (three) times daily. For substance withdrawal syndrome 04/11/14  Yes Arman Filter, MD  guaiFENesin (ROBITUSSIN) 100 MG/5ML SOLN  Take 10 mLs (200 mg total) by mouth every 4 (four) hours as needed for cough or to loosen phlegm. 04/11/14  Yes Arman Filter, MD  hydrOXYzine (ATARAX/VISTARIL) 25 MG tablet Take 25 mg (1 tablet) four times daily as needed: For anxiety/tension 01/24/14  Yes Encarnacion Slates, NP  lisinopril (PRINIVIL,ZESTRIL) 20 MG tablet Take 1 tablet (20 mg total) by mouth daily. For high blood pressure   Yes Bartholomew Crews, MD  pantoprazole (PROTONIX) 20 MG tablet Take 1 tablet (26m) daily while taking ibuprofen. 03/31/14   Yes JJones Bales MD  risperiDONE (RISPERDAL) 0.25 MG tablet Take 1 tablet (0.25 mg total) by mouth 2 (two) times daily. For mood control 01/24/14  Yes AEncarnacion Slates NP  silver sulfADIAZINE (SILVADENE) 1 % cream Apply 1 application topically 2 (two) times daily.   Yes Historical Provider, MD  traZODone (DESYREL) 50 MG tablet Take 1 tablet (50 mg total) by mouth at bedtime. For sleep 01/24/14  Yes AEncarnacion Slates NP  cephALEXin (KEFLEX) 500 MG capsule Take 1 capsule (500 mg total) by mouth 4 (four) times daily. 04/16/14   Shaquon Gropp L Zandon Talton, PA-C  HYDROcodone-ibuprofen (VICOPROFEN) 7.5-200 MG per tablet Take 1 tablet by mouth every 6 (six) hours as needed for severe pain. 04/16/14   Jaskarn Schweer L Anjalina Bergevin, PA-C   BP 158/90  Pulse 105  Temp(Src) 99.1 F (37.3 C) (Oral)  Resp 28  SpO2 99% Physical Exam  Nursing note and vitals reviewed. Constitutional: She is oriented to person, place, and time. She appears well-developed and well-nourished. No distress.  HENT:  Head: Normocephalic and atraumatic.  Right Ear: External ear normal.  Left Ear: External ear normal.  Nose: Nose normal.  Mouth/Throat: Oropharynx is clear and moist. No oropharyngeal exudate.  Eyes: Conjunctivae are normal.  Neck: Normal range of motion. Neck supple.  Cardiovascular: Normal rate, regular rhythm, normal heart sounds and intact distal pulses.  Exam reveals no gallop and no friction rub.   No murmur heard. Pulmonary/Chest: Effort normal and breath sounds normal. No accessory muscle usage. No respiratory distress. She has no wheezes. She has no rales. She exhibits no tenderness.  Abdominal: Soft. Normal appearance and bowel sounds are normal. She exhibits no distension. There is tenderness (mild tenderness) in the right upper quadrant and right lower quadrant. There is no rigidity, no rebound, no guarding and no CVA tenderness.  Musculoskeletal: Normal range of motion. She exhibits no edema.       Right ankle:  Normal.       Left ankle: Normal.       Right lower leg: She exhibits tenderness. She exhibits no bony tenderness, no swelling and no edema.       Left lower leg: She exhibits tenderness. She exhibits no bony tenderness, no swelling, no edema, no deformity and no laceration.       Legs:      Right foot: She exhibits tenderness. She exhibits normal range of motion, no bony tenderness, no swelling, normal capillary refill, no crepitus, no deformity and no laceration.       Left foot: Normal.  Negative Homan's sign.   Neurological: She is alert and oriented to person, place, and time.  Sensation grossly intact.   Skin: Skin is warm and dry. She is not diaphoretic.  Psychiatric: She has a normal mood and affect.    ED Course  Procedures (including critical care time) Medications  morphine 4 MG/ML injection 4 mg (4 mg Intramuscular Given 04/16/14 2115)  morphine 4 MG/ML injection 4 mg (4 mg Intramuscular Given 04/16/14 2237)  cephALEXin (KEFLEX) capsule 500 mg (500 mg Oral Given 04/16/14 2237)    Labs Review Labs Reviewed  CBC WITH DIFFERENTIAL - Abnormal; Notable for the following:    RBC 3.83 (*)    Hemoglobin 11.2 (*)    HCT 32.7 (*)    All other components within normal limits  COMPREHENSIVE METABOLIC PANEL - Abnormal; Notable for the following:    Potassium 3.5 (*)    Albumin 3.4 (*)    Total Bilirubin 0.2 (*)    Anion gap 16 (*)    All other components within normal limits  URINALYSIS, ROUTINE W REFLEX MICROSCOPIC - Abnormal; Notable for the following:    APPearance CLOUDY (*)    Hgb urine dipstick TRACE (*)    Leukocytes, UA MODERATE (*)    All other components within normal limits  URINE MICROSCOPIC-ADD ON - Abnormal; Notable for the following:    Squamous Epithelial / LPF FEW (*)    All other components within normal limits  LIPASE, BLOOD  TROPONIN I    Imaging Review Dg Chest 2 View  04/16/2014   CLINICAL DATA:  Chest pain  EXAM: CHEST  2 VIEW  COMPARISON:   03/29/2014  FINDINGS: Normal heart size and upper mediastinal contours. There is chronic scarring present at the level of the mid right chest. There is no edema, consolidation, effusion, or pneumothorax. Bilateral nipple shadows.  IMPRESSION: No active cardiopulmonary disease.   Electronically Signed   By: Jorje Guild M.D.   On: 04/16/2014 22:04     EKG Interpretation   Date/Time:  Monday April 16 2014 22:13:14 EDT Ventricular Rate:  99 PR Interval:  192 QRS Duration: 83 QT Interval:  381 QTC Calculation: 489 R Axis:   82 Text Interpretation:  Sinus rhythm Right atrial enlargement Consider left  ventricular hypertrophy Borderline T abnormalities, inferior leads  Borderline prolonged QT interval Similar to prior Confirmed by Mingo Amber  MD,  [place] ([id number]) on 04/16/2014 10:17:43 PM      MDM   Final diagnoses:  Right upper quadrant pain  Bilateral leg and foot pain    Filed Vitals:   04/16/14 2245  BP: 158/90  Pulse: 105  Temp:   Resp: 28   Afebrile, NAD, non-toxic appearing, AAOx4. I have reviewed nursing notes, vital signs, and all appropriate lab and imaging results for this patient.  1) Abdominal pain: Patient is nontoxic, nonseptic appearing, in no apparent distress.  Patient's pain and other symptoms adequately managed in emergency department.  Fluid bolus given.  Labs, imaging and vitals reviewed.  Patient does not meet the SIRS or Sepsis criteria.  On repeat exam patient does not have a surgical abdomin and there are nor peritoneal signs.  No indication of appendicitis, bowel obstruction, bowel perforation, cholecystitis, diverticulitis, or ectopic pregnancy. UA revealed UTI will treat with Keflex, no CVA tenderness.    2) Leg pain: Neurovascularly intact. Normal sensation. Cap refill < 3 seconds. No edema. Negative Homan's sign. No erythema, warmth, or wounds noticed. No obvious deformity. ROM intact.   Pain and symptoms improved while in ED.    Patient discharged home  with symptomatic treatment and given strict instructions for follow-up with their primary care physician.  I have also discussed reasons to return immediately to the ER.    Patient expresses understanding and agrees with plan. Patient is stable at time of discharge Patient d/w with Dr. Mingo Amber, agrees with plan.  Harlow Mares, PA-C 04/16/14 2354

## 2014-04-17 NOTE — ED Provider Notes (Signed)
Medical screening examination/treatment/procedure(s) were conducted as a shared visit with non-physician practitioner(s) and myself.  I personally evaluated the patient during the encounter.   EKG Interpretation   Date/Time:  Monday April 16 2014 22:13:14 EDT Ventricular Rate:  99 PR Interval:  192 QRS Duration: 83 QT Interval:  381 QTC Calculation: 489 R Axis:   82 Text Interpretation:  Sinus rhythm Right atrial enlargement Consider left  ventricular hypertrophy Borderline T abnormalities, inferior leads  Borderline prolonged QT interval Similar to prior Confirmed by Mingo Amber  MD,  [place] ([id number]) on 04/16/2014 10:17:43 PM      Patient here with bilateral leg pain. Began a few days ago. Eating a sandwich from [employer] upon my assessment. Stating some R chest pain, has been coughing a lot as of late. Legs with no swelling, no signs of cellulitis. Hx of pain due to vasculitis, thinks it's due to that. Labs ok. Stable for discharge.  Osvaldo Shipper, MD 04/17/14 [phone]

## 2014-04-18 ENCOUNTER — Emergency Department (HOSPITAL_COMMUNITY): Payer: Medicaid Other

## 2014-04-18 ENCOUNTER — Encounter (HOSPITAL_COMMUNITY): Payer: Self-pay | Admitting: Emergency Medicine

## 2014-04-18 ENCOUNTER — Emergency Department (HOSPITAL_COMMUNITY)
Admission: EM | Admit: 2014-04-18 | Discharge: 2014-04-18 | Disposition: A | Discharge status: Home / Self Care | Payer: Medicaid Other | Source: Home / Self Care | Attending: Emergency Medicine | Admitting: Emergency Medicine

## 2014-04-18 ENCOUNTER — Ambulatory Visit (HOSPITAL_COMMUNITY)
Admission: RE | Admit: 2014-04-18 | Discharge: 2014-04-18 | Disposition: A | Discharge status: Home / Self Care | Payer: Medicaid Other | Source: Ambulatory Visit | Attending: Internal Medicine | Admitting: Internal Medicine

## 2014-04-18 DIAGNOSIS — Z5900 Homelessness unspecified: Secondary | ICD-10-CM | POA: Diagnosis not present

## 2014-04-18 DIAGNOSIS — Z886 Allergy status to analgesic agent status: Secondary | ICD-10-CM | POA: Diagnosis not present

## 2014-04-18 DIAGNOSIS — Z1231 Encounter for screening mammogram for malignant neoplasm of breast: Secondary | ICD-10-CM | POA: Insufficient documentation

## 2014-04-18 DIAGNOSIS — R221 Localized swelling, mass and lump, neck: Secondary | ICD-10-CM

## 2014-04-18 DIAGNOSIS — M948X9 Other specified disorders of cartilage, unspecified sites: Secondary | ICD-10-CM | POA: Diagnosis present

## 2014-04-18 DIAGNOSIS — I10 Essential (primary) hypertension: Secondary | ICD-10-CM | POA: Diagnosis present

## 2014-04-18 DIAGNOSIS — E785 Hyperlipidemia, unspecified: Secondary | ICD-10-CM | POA: Diagnosis present

## 2014-04-18 DIAGNOSIS — Z1239 Encounter for other screening for malignant neoplasm of breast: Secondary | ICD-10-CM

## 2014-04-18 DIAGNOSIS — F329 Major depressive disorder, single episode, unspecified: Secondary | ICD-10-CM | POA: Diagnosis present

## 2014-04-18 DIAGNOSIS — Z7982 Long term (current) use of aspirin: Secondary | ICD-10-CM | POA: Diagnosis not present

## 2014-04-18 DIAGNOSIS — N390 Urinary tract infection, site not specified: Secondary | ICD-10-CM | POA: Diagnosis present

## 2014-04-18 DIAGNOSIS — M79606 Pain in leg, unspecified: Secondary | ICD-10-CM

## 2014-04-18 DIAGNOSIS — D649 Anemia, unspecified: Secondary | ICD-10-CM | POA: Diagnosis present

## 2014-04-18 DIAGNOSIS — I776 Arteritis, unspecified: Secondary | ICD-10-CM | POA: Diagnosis not present

## 2014-04-18 DIAGNOSIS — M069 Rheumatoid arthritis, unspecified: Secondary | ICD-10-CM | POA: Diagnosis present

## 2014-04-18 DIAGNOSIS — T374X4A Poisoning by anthelminthics, undetermined, initial encounter: Secondary | ICD-10-CM | POA: Diagnosis present

## 2014-04-18 LAB — I-STAT CHEM 8, ED
BUN: 22 mg/dL (ref 6–23)
Calcium, Ion: 1.11 mmol/L — ABNORMAL LOW (ref 1.12–1.23)
Chloride: 102 mEq/L (ref 96–112)
Creatinine, Ser: 1 mg/dL (ref 0.50–1.10)
Glucose, Bld: 93 mg/dL (ref 70–99)
HEMATOCRIT: 38 % (ref 36.0–46.0)
HEMOGLOBIN: 12.9 g/dL (ref 12.0–15.0)
Potassium: 4 mEq/L (ref 3.7–5.3)
SODIUM: 135 meq/L — AB (ref 137–147)
TCO2: 22 mmol/L (ref 0–100)

## 2014-04-18 LAB — I-STAT CG4 LACTIC ACID, ED: Lactic Acid, Venous: 1.73 mmol/L (ref 0.5–2.2)

## 2014-04-18 MED ORDER — IOHEXOL 300 MG/ML  SOLN
100.0000 mL | Freq: Once | INTRAMUSCULAR | Status: AC | PRN
  Administered 2014-04-18: 100 mL via INTRAVENOUS

## 2014-04-18 MED ORDER — FENTANYL CITRATE 0.05 MG/ML IJ SOLN
50.0000 ug | Freq: Once | INTRAMUSCULAR | Status: AC
  Administered 2014-04-18: 50 ug via INTRAMUSCULAR
  Filled 2014-04-18: qty 2

## 2014-04-18 NOTE — ED Notes (Signed)
Pt reports swollen legs bilaterally that is making it difficult to walk with 9/10 pain. Pt also has swollen lymph node on side of her neck left side. NAD.

## 2014-04-18 NOTE — ED Provider Notes (Signed)
CSN: [id number]     Arrival date & time 04/18/14  1410 History  This chart was scribed for non-physician practitioner, Cherylann Parr, PA-C, working with Ephraim Hamburger, MD by Ladene Artist, ED Scribe. This patient was seen in room WTR7/WTR7 and the patient's care was started at 9:04 PM.   Chief Complaint  Patient presents with  . Leg Swelling  . Side of neck swollen     The history is provided by the patient. No language interpreter was used.   HPI Comments: [name] is a 53 y.o. female who presents to the Emergency Department complaining of L side neck swelling onset this morning. She reports associated pain, difficulty swallowing, bilateral leg swelling, L arm pain with swelling, fever, chills, night sweats. Pt currently rates her pain as 9/10. She also reports intermittent SOB that is not different from previous SOB. She denies nausea, vomiting, chest pain, dental pain. Pt was seen 2 days ago at [facility] for similar symptoms. She was given Keflex and sent home. Pt states that she has taken her medications as prescribed.   Allergy to Tylenol  Past Medical History  Diagnosis Date  . 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 Followed by Dr. Louanne Skye  . Hypertension   . Cocaine abuse     ongoing with resultant vaculitis.  . Tobacco abuse   . Depression   . Normocytic anemia     BL Hgb 9.8-12. Last anemia panel 04/2010 - showing Fe 19, ferritin 101.  Pt on monthly B12 injections  . Rheumatoid arthritis(714.0)     patient reported  . Inflammatory arthritis   . Headache(784.0)   . 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 Skin biopsy      bilateral shin nodules on 04/2010  . Hernia repair    . Irrigation and debridement abscess Bilateral 09/26/2013    Procedure: DEBRIDEMENT ULCERS BILATERAL THIGHS;  Surgeon: Gwenyth Ober, MD;  Location: [facility] OR;  Service: General;  Laterality: Bilateral;    Family History  Problem Relation Age of Onset  . Breast cancer Mother     Breast cancer  . Alcohol abuse Mother   . Colon cancer Maternal Aunt 39  . Alcohol abuse Father    History  Substance Use Topics  . Smoking status: Current Every Day Smoker -- 0.50 packs/day for 39 years    Types: Cigarettes  . Smokeless tobacco: Never Used     Comment: trying.  Cutting back 3-4 cigerettes  . Alcohol Use: No   OB History   Grav Para Term Preterm Abortions TAB SAB Ect Mult Living                 Review of Systems  Constitutional: Positive for fever and chills.  HENT: Positive for trouble swallowing. Negative for dental problem.   Respiratory: Positive for shortness of breath (intermittent, prior).   Cardiovascular: Positive for leg swelling. Negative for chest pain.  Gastrointestinal: Negative for nausea and vomiting.  Musculoskeletal: Positive for myalgias.  All other systems reviewed and are negative.  Allergies  Acetaminophen  Home Medications   Prior to Admission medications   Medication Sig Start Date End Date Taking? Authorizing Provider  buPROPion (WELLBUTRIN) 100 MG tablet Take 1 tablet (100 mg total) by mouth daily before breakfast. For depression 01/24/14   Encarnacion Slates, NP  cephALEXin (KEFLEX) 500 MG capsule Take  1 capsule (500 mg total) by mouth 4 (four) times daily. 04/16/14   Jennifer L Piepenbrink, PA-C  gabapentin (NEURONTIN) 300 MG capsule Take 1 capsule (300 mg total) by mouth 3 (three) times daily. For substance withdrawal syndrome 04/11/14   Arman Filter, MD  guaiFENesin (ROBITUSSIN) 100 MG/5ML SOLN Take 10 mLs (200 mg total) by mouth every 4 (four) hours as needed for cough or to loosen phlegm. 04/11/14   Arman Filter, MD  HYDROcodone-ibuprofen (VICOPROFEN) 7.5-200 MG per tablet Take 1 tablet by mouth every 6 (six) hours as needed for severe pain. 04/16/14   Jennifer L Piepenbrink, PA-C  hydrOXYzine (ATARAX/VISTARIL) 25 MG tablet Take 25 mg (1 tablet) four times  daily as needed: For anxiety/tension 01/24/14   Encarnacion Slates, NP  lisinopril (PRINIVIL,ZESTRIL) 20 MG tablet Take 1 tablet (20 mg total) by mouth daily. For high blood pressure    Bartholomew Crews, MD  pantoprazole (PROTONIX) 20 MG tablet Take 1 tablet (43m) daily while taking ibuprofen. 03/31/14   JJones Bales MD  risperiDONE (RISPERDAL) 0.25 MG tablet Take 1 tablet (0.25 mg total) by mouth 2 (two) times daily. For mood control 01/24/14   AEncarnacion Slates NP  silver sulfADIAZINE (SILVADENE) 1 % cream Apply 1 application topically 2 (two) times daily.    Historical Provider, MD  traZODone (DESYREL) 50 MG tablet Take 1 tablet (50 mg total) by mouth at bedtime. For sleep 01/24/14   AEncarnacion Slates NP   Triage Vitals: BP 103/61  Pulse 106  Temp(Src) 98.1 F (36.7 C) (Oral)  Resp 16  SpO2 98% Physical Exam  Nursing note and vitals reviewed. Constitutional: She is oriented to person, place, and time. She appears well-developed and well-nourished. No distress.  HENT:  Head: Normocephalic and atraumatic.  Mouth/Throat: Uvula is midline. Posterior oropharyngeal erythema present. No oropharyngeal exudate, posterior oropharyngeal edema or tonsillar abscesses.  Eyes: Conjunctivae are normal. No scleral icterus.  Neck: No JVD present. No tracheal deviation present. No Brudzinski's sign and no Kernig's sign noted. No thyromegaly present.  Left sided neck cervical adenopathy with 4 cm x 3 cm neck mass which is tender to palpation.    Cardiovascular: Normal rate, regular rhythm, normal heart sounds and intact distal pulses.  Exam reveals no gallop and no friction rub.   No murmur heard. Pulmonary/Chest: Effort normal and breath sounds normal. No respiratory distress. She has no wheezes. She has no rales. She exhibits no tenderness.  Musculoskeletal: Normal range of motion. She exhibits no edema.       Right ankle: Normal. She exhibits normal range of motion, no swelling, no ecchymosis, no deformity, no  laceration and normal pulse. No tenderness. Achilles tendon normal.       Left ankle: Normal. She exhibits normal range of motion, no swelling, no ecchymosis, no deformity, no laceration and normal pulse. No tenderness. Achilles tendon normal.       Right lower leg: She exhibits tenderness. She exhibits no bony tenderness, no swelling, no edema, no deformity and no laceration.       Left lower leg: She exhibits tenderness. She exhibits no bony tenderness, no swelling, no edema, no deformity and no laceration.  Negative holmans sign bilaterally.   Lymphadenopathy:    She has cervical adenopathy.  Neurological: She is alert and oriented to person, place, and time. No cranial nerve deficit. Coordination normal.  Skin: Skin is warm and dry. She is not diaphoretic.  Psychiatric: She has a normal mood and affect.  Her behavior is normal. Judgment and thought content normal.    ED Course  Procedures (including critical care time) DIAGNOSTIC STUDIES: Oxygen Saturation is 98% on RA, normal by my interpretation.    COORDINATION OF CARE: 4:51 PM-Discussed treatment plan which includes CT and lab work with pt at bedside and pt agreed to plan.   Labs Review Labs Reviewed  I-STAT CHEM 8, ED - Abnormal; Notable for the following:    Sodium 135 (*)    Calcium, Ion 1.11 (*)    All other components within normal limits  I-STAT CG4 LACTIC ACID, ED    Imaging Review Dg Chest 2 View  04/16/2014   CLINICAL DATA:  Chest pain  EXAM: CHEST  2 VIEW  COMPARISON:  03/29/2014  FINDINGS: Normal heart size and upper mediastinal contours. There is chronic scarring present at the level of the mid right chest. There is no edema, consolidation, effusion, or pneumothorax. Bilateral nipple shadows.  IMPRESSION: No active cardiopulmonary disease.   Electronically Signed   By: Jorje Guild M.D.   On: 04/16/2014 22:04   Ct Soft Tissue Neck W Contrast  04/18/2014   CLINICAL DATA:  Neck swelling.  EXAM: CT NECK WITH  CONTRAST  TECHNIQUE: Multidetector CT imaging of the neck was performed using the standard protocol following the bolus administration of intravenous contrast.  CONTRAST:  155m OMNIPAQUE IOHEXOL 300 MG/ML  SOLN  COMPARISON:  CT 09/15/2010.  FINDINGS: Skull base is normal. Nasopharynx is patent. Mild tonsillar soft tissue fullness noted. Mild tonsillitis cannot be excluded that. Tongue base and tongue are unremarkable. The larynx is unremarkable. Trachea is widely patent. Thyroid is unremarkable. Parotid and submandibular glands normal. Multiple cervical lymph nodes are noted lymph nodes measure up to 6 mm. Of pulmonary apices are clear. The retropharyngeal space and bony structures are normal. Apical pleural parenchymal thickening noted most consistent with scarring. Similar finding noted on prior study.  IMPRESSION: Mild peritonsillar soft tissue fullness. Mild tonsillitis cannot be excluded. No other significant abnormality identified.   Electronically Signed   By: TMarcello Moores Register   On: 04/18/2014 19:19     EKG Interpretation None      MDM   Final diagnoses:  Localized swelling, mass and lump, neck  Pain of lower extremity, unspecified laterality   Patient presents with bilateral leg pain and neck swelling.  Neck CT is negative at this time for any signs of neck soft tissue infection or abscess.  Patient is already on keflex which will provide coverage for neck and throat pathology.  Legs are not edematous at this time.  Bilateral leg pain.  No identifiable source at this time.  No evidence for blood clot.  Will have patient follow-up with her PCP.  She was told to take her abx until they were gone and to return for worsening swelling, SOB, or trismus.  She states understanding.    I personally performed the services described in this documentation, which was scribed in my presence. The recorded information has been reviewed and is accurate.    CCherylann Parr PA-C 04/18/14 [id number]

## 2014-04-18 NOTE — Discharge Instructions (Signed)
°Lymphadenopathy °Lymphadenopathy means "disease of the lymph glands." But the term is usually used to describe swollen or enlarged lymph glands, also called lymph nodes. These are the bean-shaped organs found in many locations including the neck, underarm, and groin. Lymph glands are part of the immune system, which fights infections in your body. Lymphadenopathy can occur in just one area of the body, such as the neck, or it can be generalized, with lymph node enlargement in several areas. The nodes found in the neck are the most common sites of lymphadenopathy. °CAUSES  °When your immune system responds to germs (such as viruses or bacteria ), infection-fighting cells and fluid build up. This causes the glands to grow in size. This is usually not something to worry about. Sometimes, the glands themselves can become infected and inflamed. This is called lymphadenitis. °Enlarged lymph nodes can be caused by many diseases: °· Bacterial disease, such as strep throat or a skin infection. °· Viral disease, such as a common cold. °· Other germs, such as lyme disease, tuberculosis, or sexually transmitted diseases. °· Cancers, such as lymphoma (cancer of the lymphatic system) or leukemia (cancer of the white blood cells). °· Inflammatory diseases such as lupus or rheumatoid arthritis. °· Reactions to medications. °Many of the diseases above are rare, but important. This is why you should see your caregiver if you have lymphadenopathy. °SYMPTOMS  °· Swollen, enlarged lumps in the neck, back of the head or other locations. °· Tenderness. °· Warmth or redness of the skin over the lymph nodes. °· Fever. °DIAGNOSIS  °Enlarged lymph nodes are often near the source of infection. They can help healthcare providers diagnose your illness. For instance:  °· Swollen lymph nodes around the jaw might be caused by an infection in the mouth. °· Enlarged glands in the neck often signal a throat infection. °· Lymph nodes that are swollen  in more than one area often indicate an illness caused by a virus. °Your caregiver most likely will know what is causing your lymphadenopathy after listening to your history and examining you. Blood tests, x-rays or other tests may be needed. If the cause of the enlarged lymph node cannot be found, and it does not go away by itself, then a biopsy may be needed. Your caregiver will discuss this with you. °TREATMENT  °Treatment for your enlarged lymph nodes will depend on the cause. Many times the nodes will shrink to normal size by themselves, with no treatment. Antibiotics or other medicines may be needed for infection. Only take over-the-counter or prescription medicines for pain, discomfort or fever as directed by your caregiver. °HOME CARE INSTRUCTIONS  °Swollen lymph glands usually return to normal when the underlying medical condition goes away. If they persist, contact your health-care provider. He/she might prescribe antibiotics or other treatments, depending on the diagnosis. Take any medications exactly as prescribed. Keep any follow-up appointments made to check on the condition of your enlarged nodes.  °SEEK MEDICAL CARE IF:  °· Swelling lasts for more than two weeks. °· You have symptoms such as weight loss, night sweats, fatigue or fever that does not go away. °· The lymph nodes are hard, seem fixed to the skin or are growing rapidly. °· Skin over the lymph nodes is red and inflamed. This could mean there is an infection. °SEEK IMMEDIATE MEDICAL CARE IF:  °· Fluid starts leaking from the area of the enlarged lymph node. °· You develop a fever of 102° F (38.9° C) or greater. °· Severe   pain develops (not necessarily at the site of a large lymph node). °· You develop chest pain or shortness of breath. °· You develop worsening abdominal pain. °MAKE SURE YOU:  °· Understand these instructions. °· Will watch your condition. °· Will get help right away if you are not doing well or get worse. °Document  Released: 06/30/2008 Document Revised: 12/14/2011 Document Reviewed: 06/30/2008 °[employer]® Patient Information ©2015 [employer]. This information is not intended to replace advice given to you by your health care provider. Make sure you discuss any questions you have with your health care provider. ° ° ° °

## 2014-04-19 ENCOUNTER — Encounter: Payer: Self-pay | Admitting: Internal Medicine

## 2014-04-19 ENCOUNTER — Ambulatory Visit (INDEPENDENT_AMBULATORY_CARE_PROVIDER_SITE_OTHER): Payer: Medicaid Other | Admitting: Internal Medicine

## 2014-04-19 ENCOUNTER — Inpatient Hospital Stay (HOSPITAL_COMMUNITY)
Admission: AD | Admit: 2014-04-19 | Discharge: 2014-04-22 | DRG: 546 | Disposition: A | Discharge status: Home / Self Care | Payer: Medicaid Other | Source: Ambulatory Visit | Attending: Oncology | Admitting: Oncology

## 2014-04-19 ENCOUNTER — Ambulatory Visit: Payer: Medicaid Other | Admitting: Internal Medicine

## 2014-04-19 VITALS — BP 92/66 | HR 110 | Temp 97.7°F | Wt 117.6 lb

## 2014-04-19 DIAGNOSIS — I776 Arteritis, unspecified: Secondary | ICD-10-CM

## 2014-04-19 DIAGNOSIS — F1421 Cocaine dependence, in remission: Secondary | ICD-10-CM

## 2014-04-19 DIAGNOSIS — M941 Relapsing polychondritis: Secondary | ICD-10-CM | POA: Diagnosis present

## 2014-04-19 DIAGNOSIS — D649 Anemia, unspecified: Secondary | ICD-10-CM | POA: Diagnosis present

## 2014-04-19 DIAGNOSIS — N390 Urinary tract infection, site not specified: Secondary | ICD-10-CM | POA: Diagnosis present

## 2014-04-19 DIAGNOSIS — M948X9 Other specified disorders of cartilage, unspecified sites: Secondary | ICD-10-CM | POA: Diagnosis present

## 2014-04-19 DIAGNOSIS — Z886 Allergy status to analgesic agent status: Secondary | ICD-10-CM

## 2014-04-19 DIAGNOSIS — E785 Hyperlipidemia, unspecified: Secondary | ICD-10-CM

## 2014-04-19 DIAGNOSIS — F329 Major depressive disorder, single episode, unspecified: Secondary | ICD-10-CM | POA: Diagnosis present

## 2014-04-19 DIAGNOSIS — I10 Essential (primary) hypertension: Secondary | ICD-10-CM | POA: Diagnosis present

## 2014-04-19 DIAGNOSIS — Z5900 Homelessness unspecified: Secondary | ICD-10-CM

## 2014-04-19 DIAGNOSIS — T374X4A Poisoning by anthelminthics, undetermined, initial encounter: Secondary | ICD-10-CM | POA: Diagnosis present

## 2014-04-19 DIAGNOSIS — F1420 Cocaine dependence, uncomplicated: Secondary | ICD-10-CM

## 2014-04-19 DIAGNOSIS — Z7982 Long term (current) use of aspirin: Secondary | ICD-10-CM

## 2014-04-19 DIAGNOSIS — F14988 Cocaine use, unspecified with other cocaine-induced disorder: Secondary | ICD-10-CM | POA: Diagnosis present

## 2014-04-19 DIAGNOSIS — M069 Rheumatoid arthritis, unspecified: Secondary | ICD-10-CM | POA: Diagnosis present

## 2014-04-19 DIAGNOSIS — L959 Vasculitis limited to the skin, unspecified: Secondary | ICD-10-CM

## 2014-04-19 DIAGNOSIS — L989 Disorder of the skin and subcutaneous tissue, unspecified: Secondary | ICD-10-CM

## 2014-04-19 DIAGNOSIS — I999 Unspecified disorder of circulatory system: Secondary | ICD-10-CM

## 2014-04-19 DIAGNOSIS — I309 Acute pericarditis, unspecified: Secondary | ICD-10-CM

## 2014-04-19 DIAGNOSIS — F1910 Other psychoactive substance abuse, uncomplicated: Secondary | ICD-10-CM

## 2014-04-19 HISTORY — DX: Pneumonia, unspecified organism: J18.9

## 2014-04-19 HISTORY — DX: Migraine, unspecified, not intractable, without status migrainosus: G43.909

## 2014-04-19 LAB — CREATININE, SERUM
Creatinine, Ser: 1.07 mg/dL (ref 0.50–1.10)
GFR calc Af Amer: 68 mL/min — ABNORMAL LOW (ref >90)
GFR calc non Af Amer: 59 mL/min — ABNORMAL LOW (ref >90)

## 2014-04-19 LAB — RAPID URINE DRUG SCREEN, HOSP PERFORMED
Amphetamines: NOT DETECTED
BARBITURATES: NOT DETECTED
BENZODIAZEPINES: NOT DETECTED
Cocaine: NOT DETECTED
Opiates: POSITIVE — AB
TETRAHYDROCANNABINOL: POSITIVE — AB

## 2014-04-19 LAB — CBC
HCT: 27.2 % — ABNORMAL LOW (ref 36.0–46.0)
Hemoglobin: 9.2 g/dL — ABNORMAL LOW (ref 12.0–15.0)
MCH: 28.6 pg (ref 26.0–34.0)
MCHC: 33.8 g/dL (ref 30.0–36.0)
MCV: 84.5 fL (ref 78.0–100.0)
PLATELETS: 254 10*3/uL (ref 150–400)
RBC: 3.22 MIL/uL — ABNORMAL LOW (ref 3.87–5.11)
RDW: 14 % (ref 11.5–15.5)
WBC: 4.8 10*3/uL (ref 4.0–10.5)

## 2014-04-19 LAB — URINE MICROSCOPIC-ADD ON

## 2014-04-19 LAB — URINALYSIS, ROUTINE W REFLEX MICROSCOPIC
Bilirubin Urine: NEGATIVE
Glucose, UA: NEGATIVE mg/dL
KETONES UR: 15 mg/dL — AB
NITRITE: NEGATIVE
PH: 5.5 (ref 5.0–8.0)
Protein, ur: NEGATIVE mg/dL
Specific Gravity, Urine: 1.029 (ref 1.005–1.030)
UROBILINOGEN UA: 0.2 mg/dL (ref 0.0–1.0)

## 2014-04-19 MED ORDER — OXYCODONE HCL 5 MG PO TABS
5.0000 mg | ORAL_TABLET | ORAL | Status: DC | PRN
  Administered 2014-04-19 – 2014-04-22 (×11): 5 mg via ORAL
  Filled 2014-04-19 (×11): qty 1

## 2014-04-19 MED ORDER — DIPHENHYDRAMINE HCL 50 MG/ML IJ SOLN: 25.0000 mg | Freq: Four times a day (QID) | INTRAMUSCULAR | Status: DC | PRN

## 2014-04-19 MED ORDER — KETOROLAC TROMETHAMINE 30 MG/ML IM SOLN: 30.0000 mg | Freq: Once | INTRAMUSCULAR | Status: DC

## 2014-04-19 MED ORDER — SULFAMETHOXAZOLE-TRIMETHOPRIM 400-80 MG PO TABS: 1.0000 | ORAL_TABLET | Freq: Every day | ORAL | Status: DC

## 2014-04-19 MED ORDER — NICOTINE 7 MG/24HR TD PT24
7.0000 mg | MEDICATED_PATCH | Freq: Every day | TRANSDERMAL | Status: DC
  Administered 2014-04-19 – 2014-04-22 (×4): 7 mg via TRANSDERMAL
  Filled 2014-04-19 (×5): qty 1

## 2014-04-19 MED ORDER — ONDANSETRON HCL 4 MG PO TABS: 4.0000 mg | ORAL_TABLET | Freq: Four times a day (QID) | ORAL | Status: DC | PRN

## 2014-04-19 MED ORDER — CIPROFLOXACIN IN D5W 400 MG/200ML IV SOLN
400.0000 mg | Freq: Two times a day (BID) | INTRAVENOUS | Status: DC
  Administered 2014-04-19 – 2014-04-21 (×5): 400 mg via INTRAVENOUS
  Filled 2014-04-19 (×7): qty 200

## 2014-04-19 MED ORDER — SODIUM CHLORIDE 0.9 % IV SOLN
500.0000 mg | Freq: Two times a day (BID) | INTRAVENOUS | Status: DC
  Administered 2014-04-19 – 2014-04-20 (×2): 500 mg via INTRAVENOUS
  Filled 2014-04-19 (×3): qty 4

## 2014-04-19 MED ORDER — CIPROFLOXACIN IN D5W 400 MG/200ML IV SOLN: 500.0000 mg | Freq: Two times a day (BID) | INTRAVENOUS | Status: DC

## 2014-04-19 MED ORDER — SODIUM CHLORIDE 0.9 % IJ SOLN
3.0000 mL | Freq: Two times a day (BID) | INTRAMUSCULAR | Status: DC
  Administered 2014-04-20 – 2014-04-21 (×3): 3 mL via INTRAVENOUS

## 2014-04-19 MED ORDER — SENNOSIDES-DOCUSATE SODIUM 8.6-50 MG PO TABS
1.0000 | ORAL_TABLET | Freq: Every day | ORAL | Status: DC
  Administered 2014-04-19 – 2014-04-20 (×2): 1 via ORAL
  Filled 2014-04-19 (×4): qty 1

## 2014-04-19 MED ORDER — ONDANSETRON HCL 4 MG/2ML IJ SOLN: 4.0000 mg | Freq: Four times a day (QID) | INTRAMUSCULAR | Status: DC | PRN

## 2014-04-19 MED ORDER — SULFAMETHOXAZOLE-TMP DS 800-160 MG PO TABS
1.0000 | ORAL_TABLET | Freq: Every day | ORAL | Status: DC
Start: 2014-04-19 — End: 2014-04-22
  Administered 2014-04-19 – 2014-04-21 (×3): 1 via ORAL
  Filled 2014-04-19 (×4): qty 1

## 2014-04-19 MED ORDER — ENOXAPARIN SODIUM 40 MG/0.4ML ~~LOC~~ SOLN
40.0000 mg | SUBCUTANEOUS | Status: DC
  Administered 2014-04-19 – 2014-04-21 (×3): 40 mg via SUBCUTANEOUS
  Filled 2014-04-19 (×4): qty 0.4

## 2014-04-19 MED ORDER — ASPIRIN EC 81 MG PO TBEC
81.0000 mg | DELAYED_RELEASE_TABLET | Freq: Every day | ORAL | Status: DC
  Administered 2014-04-19 – 2014-04-22 (×4): 81 mg via ORAL
  Filled 2014-04-19 (×4): qty 1

## 2014-04-19 MED ORDER — FLUCONAZOLE 150 MG PO TABS
150.0000 mg | ORAL_TABLET | Freq: Every day | ORAL | Status: DC
  Administered 2014-04-19 – 2014-04-22 (×4): 150 mg via ORAL
  Filled 2014-04-19 (×4): qty 1

## 2014-04-19 MED ORDER — MORPHINE SULFATE 2 MG/ML IJ SOLN
2.0000 mg | INTRAMUSCULAR | Status: DC | PRN
  Administered 2014-04-19 – 2014-04-20 (×2): 2 mg via INTRAVENOUS
  Filled 2014-04-19 (×2): qty 1

## 2014-04-19 MED ORDER — KETOROLAC TROMETHAMINE 30 MG/ML IJ SOLN
30.0000 mg | Freq: Once | INTRAMUSCULAR | Status: AC
  Administered 2014-04-19: 30 mg via INTRAMUSCULAR

## 2014-04-19 NOTE — Assessment & Plan Note (Signed)
Patients chest pain and shortness of breath have resolved, and she is not currently on any treatments.  ESR and CRP were trending down at last visit.  Workup for a cause was negative, including CMP, TB, HIV, and ANA.  Diarrhea from colchicine has resolved. -Will continue to monitor.

## 2014-04-19 NOTE — Progress Notes (Signed)
Subjective:    [name], female    DOB: 10-17-1960, 53 y.o.   MRN: [id number]  HPI Comments: Ms. 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  She is very teary and upset during the interview.  She says the pain and swelling just came up last night.  She has noticed that her ear and nose have been painful, and the skin has gotten darker.  She has been having bleeding from her nose. She was seen in the ER twice in the last week for swelling of her left neck and leg, and she was given some pain medications and Keflex.  She has been taking two pills of Keflex twice a day for two days.  She thinks the swelling in her neck has decreased since she has been on the Keflex.  She continues to have arthralgias and myalgias in her left arm, left leg, and right leg.  She had her first vasculitis/chondritis symptoms over 5 years ago.  She remembers seeing a rheumatologist a couple of times, but she hasn't seen one in a long time.  She has received cyclophosphamide and hydroxychloroquine in the past, but hasn't received these medications for awhile.  She has also received steroid tapers several times in the past.  She last received them 9 months to a year ago.  I saw her in clinic for follow up of a hospitalization for pericarditis a week ago and stopped her colchicine due to diarrhea.  She reports no worsening of her chest pain or shortness of breath and her diarrhea has stopped.     Review of Systems  Constitutional: Positive for fever, chills, diaphoresis and fatigue. Negative for activity change and appetite change.  HENT: Positive for nosebleeds. Negative for congestion and rhinorrhea.   Eyes: Negative for pain.  Respiratory: Negative for cough, choking, chest tightness and  wheezing.   Cardiovascular: Negative for chest pain and palpitations.  Gastrointestinal: Negative for nausea, vomiting, diarrhea, constipation, blood in stool and abdominal distention.  Genitourinary: Negative for dysuria, hematuria and difficulty urinating.  Musculoskeletal: Positive for arthralgias and myalgias.  Neurological: Negative for dizziness and headaches.  Psychiatric/Behavioral: Positive for dysphoric mood and agitation.       Objective:   Physical Exam  Constitutional: She is oriented to person, place, and time. No distress.  HENT:  Head: Normocephalic.  Mouth/Throat: Oropharynx is clear and moist. No oropharyngeal exudate.  Scabbed and dusky ear right ear helix, unchanged from previous exam.  Swelling and dusky appearance of left ear helix and tip of nose, painful to palpation.  Eyes: Conjunctivae and EOM are normal. Pupils are equal, round, and reactive to light. Right eye exhibits no discharge. Left eye exhibits no discharge. No scleral icterus.  Neck:  Slight swelling of lateral left neck.  Cardiovascular: Regular rhythm, normal heart sounds and intact distal pulses.  Exam reveals no gallop and no friction rub.   No murmur heard. Tachycardic.  Pulmonary/Chest: Effort normal and breath sounds normal. No respiratory distress. She has no wheezes. She has no rales.  Abdominal: Soft. Bowel sounds are normal. She exhibits no distension. There is no tenderness. There is no rebound and no guarding.  Musculoskeletal:  Pain and trouble weight bearing on both legs L>R.  Neurological: She is alert and oriented to person,  place, and time. No cranial nerve deficit.  Skin: She is not diaphoretic.  Old ulcer above left knee with new scab overlying, 1x1 cm.  Psychiatric:  Teary and upset during exam.          Assessment & Plan:  Please see problem-based assessment and plan.

## 2014-04-19 NOTE — Assessment & Plan Note (Signed)
Reports no cocaine in over a month.  She says gabapentin has not been helping with joint pains.  Etiology of the pains more likely due to polychondritis than withdrawal at this point. -Encouraged patient that best way to avoid future vasculitis recurrence is to continue to avoid cocaine use. -Discontinue gabapentin.

## 2014-04-19 NOTE — ED Provider Notes (Signed)
Medical screening examination/treatment/procedure(s) were performed by non-physician practitioner and as supervising physician I was immediately available for consultation/collaboration.   EKG Interpretation None        Ephraim Hamburger, MD 04/19/14 [phone]

## 2014-04-19 NOTE — Progress Notes (Signed)
Report called to RN on 2W. Attempted to start NSL x1 - unsuccessful;RN awared.

## 2014-04-19 NOTE — Patient Instructions (Signed)
Thank you for coming to clinic today [name].  General instructions: -Your ear pain and nose pain is due to a recurrence of your vasculitis.  We need to admit you to the hospital to get the correct treatment and see a specialist who can prevent these recurrences in the future. -Please make a follow up appointment to return to clinic after you have been discharged from the hospital.

## 2014-04-19 NOTE — Assessment & Plan Note (Signed)
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  Similar to her initial presentation 5 years ago, the inflammation is localized to cartilaginous regions of her body.  She has poor access to medications due to financial limitations and has not been on any preventative treatment for her autoimmune disease.  She will need to see rheumatology to discuss the acute management of her vasculitis and steps to prevent recurrence in the future.  She needs intravenous steroids to halt the progression of her vasculitis and would likely benefit from additional immunomodulatory therapy such as cyclophosphamide, which cannot be administered in the outpatient setting.  She is also tachycardic and her blood pressure is running low, so she would benefit from close monitoring.  Of note, contacted a local Rheumatology clinic and earliest appointment for an acute problem was December. -Toradol 30 mg in clinic for pain. -Admit to hospital for treatment. -Will need long term Rheumatology follow up.

## 2014-04-19 NOTE — H&P (Signed)
Date: 04/19/2014               Patient Name:  [name] MRN: [id number]  DOB: July 20, 1961 Age / Sex: 53 y.o., female   PCP: Arman Filter, MD         Medical Service: Internal Medicine Teaching Service         Attending Physician: Dr. Annia Belt, MD    First Contact: Dr. Lottie Mussel Pager: [phone]  Second Contact: Dr. Clinton Gallant Pager: [phone]       After Hours (After 5p/  First Contact Pager: [phone]  weekends / holidays): Second Contact Pager: [phone]   Chief Complaint: ear and nose pain  History of Present Illness: Ms. Haff is a 53 year old woman with a past medical history of cocaine abuse, hypertension, major depression, and rheumatoid arthritis who presents today with a one day history of blackened painful lesions on her ears and the tip of her nose. She notes that these lesions appeared last night after she presented to the [facility] swelling on her left neck and in both of her legs. These lesions are so painful that they induced crying throughout part of the exam. She noted that the Fentanyl she received at [facility] helped them to feel better. They also feel better when she presses on them. They feel worse when they are touched or scraped. She has had similar lesions before, on and off, over the last two years. She has been told that this is vasculitis related to using cocaine that has been cut with another substance. She has been hospitalized for this same problem multiple times within the last two years and has responded well to intravenous steroids. She has also had some sanguinous nasal discharge. She notes two other lesions on her left thigh and one on her right thigh that are also painful. She has diffuse calf pain bilaterally that makes it difficult for her to walk. The lesions have not bled or grown in size. Although she knows that this vasculitis has been attributed to cocaine use in the past, she reports no cocaine use for 2 months. She also has had  some weakness in her legs and has difficulty standing up from sitting. She was given crutches at [facility] last night and she says that she has been using them for support in standing and once she stands up she is then able to walk without difficulty. She notes joint pain in her knees. She denies chest pain, palpitations, SOB, abdominal pain, nausea, vomiting, fever, chills, sore throat, difficulty swallowing, or involuntary lacrimation.   Meds: Current Facility-Administered Medications  Medication Dose Route Frequency Provider Last Rate Last Dose  . aspirin EC tablet 81 mg  81 mg Oral Daily Clinton Gallant, MD      . ciprofloxacin (CIPRO) IVPB 500 mg  500 mg Intravenous BID Kelby Aline, MD      . diphenhydrAMINE (BENADRYL) injection 25 mg  25 mg Intravenous Q6H PRN Clinton Gallant, MD      . enoxaparin (LOVENOX) injection 40 mg  40 mg Subcutaneous Q24H Clinton Gallant, MD      . fluconazole (DIFLUCAN) tablet 150 mg  150 mg Oral Daily Clinton Gallant, MD   150 mg at 04/19/14 1840  . methylPREDNISolone sodium succinate (SOLU-MEDROL) 500 mg in sodium chloride 0.9 % 50 mL IVPB  500 mg Intravenous Q12H Clinton Gallant, MD   500 mg at 04/19/14 1840  . morphine 2 MG/ML injection 2  mg  2 mg Intravenous Q2H PRN Clinton Gallant, MD   2 mg at 04/19/14 1839  . nicotine (NICODERM CQ - dosed in mg/24 hr) patch 7 mg  7 mg Transdermal Daily Clinton Gallant, MD   7 mg at 04/19/14 1840  . ondansetron (ZOFRAN) tablet 4 mg  4 mg Oral Q6H PRN Clinton Gallant, MD       Or  . ondansetron [facility]) injection 4 mg  4 mg Intravenous Q6H PRN Clinton Gallant, MD      . oxyCODONE (Oxy IR/ROXICODONE) immediate release tablet 5 mg  5 mg Oral Q4H PRN Clinton Gallant, MD   5 mg at 04/19/14 1741  . senna-docusate (Senokot-S) tablet 1 tablet  1 tablet Oral QHS Clinton Gallant, MD      . sodium chloride 0.9 % injection 3 mL  3 mL Intravenous Q12H Clinton Gallant, MD      . sulfamethoxazole-trimethoprim (BACTRIM DS) 800-160 MG per tablet 1 tablet  1 tablet Oral Daily Clinton Gallant, MD   1  tablet at 04/19/14 1840    Allergies: Allergies as of 04/19/2014 - Review Complete 04/19/2014  Allergen Reaction Noted  . Acetaminophen Swelling    Past Medical History  Diagnosis Date  . 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 Followed by Dr. Louanne Skye  . Hypertension   . Cocaine abuse     ongoing with resultant vaculitis.  . Tobacco abuse   . Depression   . Normocytic anemia     BL Hgb 9.8-12. Last anemia panel 04/2010 - showing Fe 19, ferritin 101.  Pt on monthly B12 injections  . Rheumatoid arthritis(714.0)     patient reported  . Inflammatory arthritis   . Headache(784.0)   . 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 Skin biopsy      bilateral shin nodules on 04/2010  . Hernia repair    . Irrigation and debridement abscess Bilateral 09/26/2013    Procedure: DEBRIDEMENT ULCERS BILATERAL THIGHS;  Surgeon: Gwenyth Ober, MD;  Location: [facility] OR;  Service: General;  Laterality: Bilateral;   Family History  Problem Relation Age of Onset  . Breast cancer Mother     Breast cancer  . Alcohol abuse Mother   . Colon cancer Maternal Aunt 29  . Alcohol abuse Father    History   Social History  . Marital Status: Single    Spouse Name: N/A    Number of Children: 0  . Years of Education: 11th grade   Occupational History  . Disability     since 2011, due to her rheumatoid arthritis   Social History Main Topics  . Smoking status: Current Every Day Smoker -- 0.30 packs/day for 39 years    Types: Cigarettes  . Smokeless tobacco: Never Used     Comment: trying.  Cutting back 3-4 cigerettes  . Alcohol Use: No  . Drug Use: Yes    Special: "Crack" cocaine, Cocaine, Marijuana     Comment: denies cocaine use x 2 months   . Sexual Activity: Not on file   Other Topics Concern  . Not on file   Social History Narrative   Unemployed:  cleaning in past   Living at [place]; has Medicaid.    crack/cocaine use; pt denies IVDU   tobacco:  1/2 ppd, trying to quit   alcohol:  none  Review of Systems: A comprehensive review of systems was negative except for: as noted above in HPI  Physical Exam: Blood pressure 112/49, pulse 89, temperature 97.5 F (36.4 C), temperature source Oral, resp. rate 18, height 5' 1"  (1.549 m), weight 113 lb 1.5 oz (51.3 kg), SpO2 100.00%. BP 112/49  Pulse 89  Temp(Src) 97.5 F (36.4 C) (Oral)  Resp 18  Ht 5' 1"  (1.549 m)  Wt 113 lb 1.5 oz (51.3 kg)  BMI 21.38 kg/m2  SpO2 100%  General Appearance:    Alert, cooperative, tearful and in pain, appears older than age  9:   Blackened flaky palpable purpuric lesions on bilateral helices of the ears and on the tip of the nosePERRL, Moist mucous membranes  Neck:   No carotid bruit or JVD  Back:     Symmetric, no curvature  Lungs:     Clear to auscultation bilaterally, respirations unlabored  Chest Wall:    No tenderness or deformity   Heart:    Regular rate and rhythm, S1 and S2 normal, no murmur, rub   or gallop  Abdomen:     Soft, non-tender, bowel sounds active all four quadrants,    no masses, no organomegaly  Extremities:   Scars on the bilateral feet and legs from prior surgical treatment of vasculitis, 3cm open lesion on the L thigh, nearby closed 2cm lesion. No edema  Pulses:   2+ and symmetric radial and DP pulses  Neurologic:   CNII-XII intact, normal strength, sensation and reflexes    throughout    Lab results: Basic Metabolic Panel:  Recent Labs  04/18/14 1750  NA 135*  K 4.0  CL 102  GLUCOSE 93  BUN 22  CREATININE 1.00   Liver Function Tests: No results found for this basename: AST, ALT, ALKPHOS, BILITOT, PROT, ALBUMIN,  in the last 72 hours No results found for this basename: LIPASE, AMYLASE,  in the last 72 hours No results found for this basename: AMMONIA,  in the last 72 hours CBC:  Recent Labs  04/18/14 1750  HGB 12.9  HCT 38.0   Cardiac  Enzymes:  Recent Labs  04/16/14 2116  TROPONINI <0.30   BNP: No results found for this basename: PROBNP,  in the last 72 hours D-Dimer: No results found for this basename: DDIMER,  in the last 72 hours CBG: No results found for this basename: GLUCAP,  in the last 72 hours Hemoglobin A1C: No results found for this basename: HGBA1C,  in the last 72 hours Fasting Lipid Panel: No results found for this basename: CHOL, HDL, LDLCALC, TRIG, CHOLHDL, LDLDIRECT,  in the last 72 hours Thyroid Function Tests: No results found for this basename: TSH, T4TOTAL, FREET4, T3FREE, THYROIDAB,  in the last 72 hours Anemia Panel: No results found for this basename: VITAMINB12, FOLATE, FERRITIN, TIBC, IRON, RETICCTPCT,  in the last 72 hours Coagulation: No results found for this basename: LABPROT, INR,  in the last 72 hours Urine Drug Screen: Drugs of Abuse     Component Value Date/Time   LABOPIA NONE DETECTED 03/29/2014 1423   LABOPIA PPS 08/31/2012 1045   COCAINSCRNUR NONE DETECTED 03/29/2014 1423   COCAINSCRNUR NEG 08/31/2012 1045   LABBENZ NONE DETECTED 03/29/2014 1423   LABBENZ NEG 08/31/2012 1045   LABBENZ NEGATIVE 06/12/2011 0221   AMPHETMU NONE DETECTED 03/29/2014 1423   AMPHETMU NEGATIVE 06/12/2011 0221   THCU POSITIVE* 03/29/2014 1423   THCU 63.4* 05/20/2012 1405   LABBARB NONE DETECTED 03/29/2014 1423  LABBARB NEG 08/31/2012 1045    Alcohol Level: No results found for this basename: ETH,  in the last 72 hours Urinalysis:  Recent Labs  04/16/14 2113  COLORURINE YELLOW  LABSPEC 1.022  PHURINE 7.0  [place]  PROTEINUR NEGATIVE  UROBILINOGEN 0.2  NITRITE NEGATIVE  [place]. Labs: 7/15 lactate 1.73  Imaging results:  Ct Soft Tissue Neck W Contrast  04/18/2014   CLINICAL DATA:  Neck swelling.  EXAM: CT NECK WITH CONTRAST  TECHNIQUE: Multidetector CT imaging of the neck was performed using the  standard protocol following the bolus administration of intravenous contrast.  CONTRAST:  19m OMNIPAQUE IOHEXOL 300 MG/ML  SOLN  COMPARISON:  CT 09/15/2010.  FINDINGS: Skull base is normal. Nasopharynx is patent. Mild tonsillar soft tissue fullness noted. Mild tonsillitis cannot be excluded that. Tongue base and tongue are unremarkable. The larynx is unremarkable. Trachea is widely patent. Thyroid is unremarkable. Parotid and submandibular glands normal. Multiple cervical lymph nodes are noted lymph nodes measure up to 6 mm. Of pulmonary apices are clear. The retropharyngeal space and bony structures are normal. Apical pleural parenchymal thickening noted most consistent with scarring. Similar finding noted on prior study.  IMPRESSION: Mild peritonsillar soft tissue fullness. Mild tonsillitis cannot be excluded. No other significant abnormality identified.   Electronically Signed   By: TMarcello Moores Register   On: 04/18/2014 19:19   Mm Digital Screening  04/19/2014   CLINICAL DATA:  Screening.  EXAM: DIGITAL SCREENING BILATERAL MAMMOGRAM WITH CAD  COMPARISON:  Previous exam(s).  ACR Breast Density Category c: The breast tissue is heterogeneously dense, which may obscure small masses.  FINDINGS: There are no findings suspicious for malignancy. Images were processed with CAD.  IMPRESSION: No mammographic evidence of malignancy. A result letter of this screening mammogram will be mailed directly to the patient.  RECOMMENDATION: Screening mammogram in one year. (Code:SM-B-01Y)  BI-RADS CATEGORY  1: Negative.   Electronically Signed   By: JHassan RowanM.D.   On: 04/19/2014 13:11    Other results: EKG: normal sinus rhythm, LVH.  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 B/l ear and nose lesions - This is likely vasculitis associated with recurrent polychondritis. She has had several episodes in the  past  secondary to cocaine laced with levamisole. She is cocaine negative on UDS and is tearful that she believes she is dismissed by physicians that is cocaine related. Consider searching for alternate etiology as patient states that she has not used cocaine for months. This is may be autoimmune although ANA was negative on 04/11/14. In 06/2012, she was positive for p-ANCA 1:640 but negative for c-ANCA or ANA.  Per conversation with rheumatology, anti-levamisole antibodies can be in the system for months and this presentation (necrotic bulb of nose) is typical. He suggested heavy steroids and consider cyclophosphamide or rituximab after three days. This immunosuppression will require prophylaxis. She is in severe pain which should also be addressed. -rheumatology consult -Methylprednisolone 500 mg in sodium chloride 0.9% IV for 30 minutes Q12H -Diflucan 155mtablet PO daily  -Bactrim 800-16039mablet PO daily -cipro 500 mg iv BID -morphine 2mg38m injection IV Q2H PRN severe pain -Oxycodone immediate release tablet 5mg 49mQ4H PRN moderate pain -Zofran tablet 4mg P43m6H PRN nausea -CBC, pending -BMP, pending -HIV antibody, pending -Hepatitis panel,  pending -Quantiferon tb gold assay, pending -anti-levamisole antibody  2. Hypertension: Patient has a history of hypertension, currently managed with lisinopril. Her BP on admission was 112/49. -aspirin EC tablet 19m PO daily -continue Lisinopril 242mPO daily  3. Hyperlipidemia: Patient has reported history of hyperlipidemia, no medications. Most recent LDL: 125 (02/14/2012).  -Lipid panel, pending  4. Polysubstance abuse: She has a history of cocaine, marijuana, and cigarette use. UDS positive for THC in June. Patient denies any recent cocaine use. -Nicoderm patch 40m68maily -Gabapentin 300m25m TID for substance abuse withdrawal  -Urine rapid drug screen, pending  5. Major depression: Patient was appropriately upset and tearful at the pain she  was currently experiencing. She has been managed at home with Wellbutrin, Atarax/Vistaril PRN, and Risperdal. -Wellbutrin 100mg64mdaily before breakfast -Atarax/vistaril 25mg 42m times daily PRN anxiety/tension -Risperdal 0.25mg P2mD  6. UTI: Urinalysis on 04/16/14 showed UTI.  -bactrim ppx should cover UTI  Dispo: Disposition is deferred at this time, awaiting improvement of current medical problems. Anticipated discharge in approximately 2 day(s).   The patient does have a current PCP ([facility]veretArman [facility]d [facility].  The patient does have transportation limitations that hinder transportation to clinic appointments.  Signed: Emili Mcloughlin L Kelby Aline16/2015, 7:41 PM

## 2014-04-20 DIAGNOSIS — D690 Allergic purpura: Secondary | ICD-10-CM

## 2014-04-20 LAB — HEPATITIS PANEL, ACUTE
HCV AB: NEGATIVE
Hep A IgM: NONREACTIVE
Hep B C IgM: NONREACTIVE
Hepatitis B Surface Ag: NEGATIVE

## 2014-04-20 LAB — COMPREHENSIVE METABOLIC PANEL
ALT: 10 U/L (ref 0–35)
AST: 16 U/L (ref 0–37)
Albumin: 3 g/dL — ABNORMAL LOW (ref 3.5–5.2)
Alkaline Phosphatase: 100 U/L (ref 39–117)
Anion gap: 16 — ABNORMAL HIGH (ref 5–15)
BUN: 22 mg/dL (ref 6–23)
CALCIUM: 8.9 mg/dL (ref 8.4–10.5)
CO2: 23 meq/L (ref 19–32)
CREATININE: 0.82 mg/dL (ref 0.50–1.10)
Chloride: 102 mEq/L (ref 96–112)
GFR, EST NON AFRICAN AMERICAN: 81 mL/min — AB (ref >90)
GLUCOSE: 173 mg/dL — AB (ref 70–99)
Potassium: 4.4 mEq/L (ref 3.7–5.3)
Sodium: 141 mEq/L (ref 137–147)
Total Bilirubin: <0.2 mg/dL — ABNORMAL LOW (ref 0.3–1.2)
Total Protein: 7 g/dL (ref 6.0–8.3)

## 2014-04-20 LAB — GLUCOSE, CAPILLARY
GLUCOSE-CAPILLARY: 170 mg/dL — AB (ref 70–99)
Glucose-Capillary: 163 mg/dL — ABNORMAL HIGH (ref 70–99)
Glucose-Capillary: 152 mg/dL — ABNORMAL HIGH (ref 70–99)

## 2014-04-20 LAB — CBC
HCT: 29.4 % — ABNORMAL LOW (ref 36.0–46.0)
Hemoglobin: 9.8 g/dL — ABNORMAL LOW (ref 12.0–15.0)
MCH: 28 pg (ref 26.0–34.0)
MCHC: 33.3 g/dL (ref 30.0–36.0)
MCV: 84 fL (ref 78.0–100.0)
PLATELETS: 279 10*3/uL (ref 150–400)
RBC: 3.5 MIL/uL — ABNORMAL LOW (ref 3.87–5.11)
RDW: 14 % (ref 11.5–15.5)
WBC: 4.1 10*3/uL (ref 4.0–10.5)

## 2014-04-20 LAB — HIV ANTIBODY (ROUTINE TESTING W REFLEX): HIV 1&2 Ab, 4th Generation: NONREACTIVE

## 2014-04-20 MED ORDER — SODIUM CHLORIDE 0.9 % IV SOLN
500.0000 mg | INTRAVENOUS | Status: DC
  Administered 2014-04-21 – 2014-04-22 (×2): 500 mg via INTRAVENOUS
  Filled 2014-04-20 (×2): qty 4

## 2014-04-20 MED ORDER — BUPROPION HCL 100 MG PO TABS
100.0000 mg | ORAL_TABLET | Freq: Every day | ORAL | Status: DC
  Administered 2014-04-20 – 2014-04-22 (×3): 100 mg via ORAL
  Filled 2014-04-20 (×4): qty 1

## 2014-04-20 MED ORDER — RISPERIDONE 0.25 MG PO TABS
0.2500 mg | ORAL_TABLET | Freq: Two times a day (BID) | ORAL | Status: DC
  Administered 2014-04-20 – 2014-04-22 (×5): 0.25 mg via ORAL
  Filled 2014-04-20 (×6): qty 1

## 2014-04-20 MED ORDER — PANTOPRAZOLE SODIUM 40 MG PO TBEC
40.0000 mg | DELAYED_RELEASE_TABLET | Freq: Two times a day (BID) | ORAL | Status: DC
  Administered 2014-04-20 – 2014-04-22 (×5): 40 mg via ORAL
  Filled 2014-04-20 (×4): qty 1

## 2014-04-20 NOTE — Progress Notes (Signed)
Subjective: Ms. Cerniglia felt "1000x better" this morning. She thinks her nose and ears are still tender, but the pain is much better and she can touch them now. She also thinks her congestion does not have a sanguinous tinge to it this morning. She had some small sweating this evening. She had no BM overnight. She denies any chest pain, SOB, fevers, chills, abdominal pain, nausea, emesis, dizziness, or lightheadedness.  Objective: Vital signs in last 24 hours: Filed Vitals:   04/19/14 1704 04/19/14 1957 04/20/14 0624  BP: 112/49  121/69  Pulse: 89 74 74  Temp: 97.5 F (36.4 C) 97.9 F (36.6 C) 97.6 F (36.4 C)  TempSrc: Oral Oral Oral  Resp: 18 18 18   Height: 5' 1"  (1.549 m)    Weight: 113 lb 1.5 oz (51.3 kg)    SpO2: 100% 95% 100%   Weight change:   Intake/Output Summary (Last 24 hours) at 04/20/14 1114 Last data filed at 04/20/14 0349  Gross per 24 hour  Intake    480 ml  Output   1200 ml  Net   -720 ml   General Appearance: Alert, cooperative, much better spirits this morning, appears older than age  53: Blackened flaky palpable purpuric lesions on bilateral helices of the ears and on the tip of the nose appear unchanged, PERRL, Moist mucous membranes  Neck: No carotid bruit or JVD  Back: Symmetric, no curvature  Lungs: Clear to auscultation bilaterally, respirations unlabored  Chest Wall: No tenderness or deformity  Heart: Regular rate and rhythm, S1 and S2 normal, no murmur, rub or gallop  Abdomen: Soft, non-tender, bowel sounds active all four quadrants, no masses, no organomegaly  Extremities: Scars on the bilateral feet and legs from prior surgical treatment of vasculitis, 3cm open lesion on the L thigh/leg, nearby closed 2cm lesion. Lesion not noted yesterday on R lateral leg. No edema  Pulses: 2+ and symmetric radial and DP pulses   Lab Results: Basic Metabolic Panel:  Recent Labs Lab 04/16/14 1916 04/18/14 1750 04/19/14 1948 04/20/14 0405  NA 139 135*   --  141  K 3.5* 4.0  --  4.4  CL 100 102  --  102  CO2 23  --   --  23  GLUCOSE 99 93  --  173*  BUN 12 22  --  22  CREATININE 0.70 1.00 1.07 0.82  CALCIUM 9.3  --   --  8.9   Liver Function Tests:  Recent Labs Lab 04/16/14 1916 04/20/14 0405  AST 16 16  ALT 10 10  ALKPHOS 99 100  BILITOT 0.2* <0.2*  PROT 7.5 7.0  ALBUMIN 3.4* 3.0*    Recent Labs Lab 04/16/14 1916  LIPASE 19   No results found for this basename: AMMONIA,  in the last 168 hours CBC:  Recent Labs Lab 04/16/14 1916  04/19/14 1948 04/20/14 0405  WBC 7.2  --  4.8 4.1  NEUTROABS 4.8  --   --   --   HGB 11.2*  < > 9.2* 9.8*  HCT 32.7*  < > 27.2* 29.4*  MCV 85.4  --  84.5 84.0  PLT 229  --  254 279  < > = values in this interval not displayed. Cardiac Enzymes:  Recent Labs Lab 04/16/14 2116  TROPONINI <0.30   BNP: No results found for this basename: PROBNP,  in the last 168 hours D-Dimer: No results found for this basename: DDIMER,  in the last 168 hours CBG:  Recent  Labs Lab 04/20/14 0654  GLUCAP 163*   Hemoglobin A1C: No results found for this basename: HGBA1C,  in the last 168 hours Fasting Lipid Panel: No results found for this basename: CHOL, HDL, LDLCALC, TRIG, CHOLHDL, LDLDIRECT,  in the last 168 hours Thyroid Function Tests: No results found for this basename: TSH, T4TOTAL, FREET4, T3FREE, THYROIDAB,  in the last 168 hours Coagulation: No results found for this basename: LABPROT, INR,  in the last 168 hours Anemia Panel: No results found for this basename: VITAMINB12, FOLATE, FERRITIN, TIBC, IRON, RETICCTPCT,  in the last 168 hours Urine Drug Screen: Drugs of Abuse     Component Value Date/Time   LABOPIA POSITIVE* 04/19/2014 2100   LABOPIA PPS 08/31/2012 1045   COCAINSCRNUR NONE DETECTED 04/19/2014 2100   COCAINSCRNUR NEG 08/31/2012 1045   LABBENZ NONE DETECTED 04/19/2014 2100   LABBENZ NEG 08/31/2012 1045   LABBENZ NEGATIVE 06/12/2011 0221   AMPHETMU NONE DETECTED  04/19/2014 2100   AMPHETMU NEGATIVE 06/12/2011 0221   THCU POSITIVE* 04/19/2014 2100   THCU 63.4* 05/20/2012 [place] DETECTED 04/19/2014 2100   LABBARB NEG 08/31/2012 1045    Alcohol Level: No results found for this basename: ETH,  in the last 168 hours Urinalysis:  Recent Labs Lab 04/16/14 2113 04/19/14 2100  COLORURINE YELLOW YELLOW  LABSPEC 1.022 1.029  PHURINE 7.0 5.5  GLUCOSEU NEGATIVE NEGATIVE  HGBUR TRACE* TRACE*  BILIRUBINUR NEGATIVE NEGATIVE  KETONESUR NEGATIVE 15*  PROTEINUR NEGATIVE NEGATIVE  UROBILINOGEN 0.2 0.2  NITRITE NEGATIVE NEGATIVE  LEUKOCYTESUR MODERATE* TRACE*   Misc. Labs: HIV negative Hep A,B,C neg quantiferon pending  Micro Results: No results found for this or any previous visit (from the past 240 hour(s)). Studies/Results: Ct Soft Tissue Neck W Contrast  04/18/2014   CLINICAL DATA:  Neck swelling.  EXAM: CT NECK WITH CONTRAST  TECHNIQUE: Multidetector CT imaging of the neck was performed using the standard protocol following the bolus administration of intravenous contrast.  CONTRAST:  176m OMNIPAQUE IOHEXOL 300 MG/ML  SOLN  COMPARISON:  CT 09/15/2010.  FINDINGS: Skull base is normal. Nasopharynx is patent. Mild tonsillar soft tissue fullness noted. Mild tonsillitis cannot be excluded that. Tongue base and tongue are unremarkable. The larynx is unremarkable. Trachea is widely patent. Thyroid is unremarkable. Parotid and submandibular glands normal. Multiple cervical lymph nodes are noted lymph nodes measure up to 6 mm. Of pulmonary apices are clear. The retropharyngeal space and bony structures are normal. Apical pleural parenchymal thickening noted most consistent with scarring. Similar finding noted on prior study.  IMPRESSION: Mild peritonsillar soft tissue fullness. Mild tonsillitis cannot be excluded. No other significant abnormality identified.   Electronically Signed   By: TMarcello Moores Register   On: 04/18/2014 19:19   Mm Digital  Screening  04/19/2014   CLINICAL DATA:  Screening.  EXAM: DIGITAL SCREENING BILATERAL MAMMOGRAM WITH CAD  COMPARISON:  Previous exam(s).  ACR Breast Density Category c: The breast tissue is heterogeneously dense, which may obscure small masses.  FINDINGS: There are no findings suspicious for malignancy. Images were processed with CAD.  IMPRESSION: No mammographic evidence of malignancy. A result letter of this screening mammogram will be mailed directly to the patient.  RECOMMENDATION: Screening mammogram in one year. (Code:SM-B-01Y)  BI-RADS CATEGORY  1: Negative.   Electronically Signed   By: JHassan RowanM.D.   On: 04/19/2014 13:11   Medications: I have reviewed the patient's current medications. Scheduled Meds: . aspirin EC  81 mg Oral Daily  .  buPROPion  100 mg Oral QAC breakfast  . ciprofloxacin  400 mg Intravenous Q12H  . enoxaparin (LOVENOX) injection  40 mg Subcutaneous Q24H  . fluconazole  150 mg Oral Daily  . [START ON 04/21/2014] methylPREDNISolone (SOLU-MEDROL) injection  500 mg Intravenous Q24H  . nicotine  7 mg Transdermal Daily  . pantoprazole  40 mg Oral BID  . risperiDONE  0.25 mg Oral BID  . senna-docusate  1 tablet Oral QHS  . sodium chloride  3 mL Intravenous Q12H  . 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 She has had several episodes in the past secondary to cocaine laced with levamisole. She is cocaine negative on UDS but per rheumatology, these reactions can occur up to three months later (as long as antibodies are in serum) and she does admit to cocaine use several months ago.  She was p-ANCA positive which is also seen in this as is the necrosis of nose bulb. She is responding well to methylpredisolone 500 mg daily  As her pain is much less intense today and necrosis of nose and ears has not spread. Rheumatology (Dr Ouida Sills) thinks she would benefit from 3 days of heavy steroids and will consider cyclophosphamide or rituximab later. This immunosuppression will require prophylaxis (antibiotic and PPI). She is in severe pain which should also be addressed.  -appreciate rheumatology -Methylprednisolone 500 mg in sodium chloride 0.9% IV for 30 minutes Q24H x 3 d -Diflucan 165m tablet PO daily  -Bactrim 800-1677mtablet PO daily  -cipro 400 mg iv BID  -protonix 40 mg bid -morphine 5m48mL injection IV Q2H PRN severe pain  -Oxycodone immediate release tablet 5mg73m Q4H PRN moderate pain  -Zofran tablet 4mg 16mQ6H PRN nausea  -Quantiferon tb gold assay, pending  -anti-levamisole antibody if lab can test  2. Hypertension: BP has been 110s-120s/40s-60s. Patient has a history of hypertension. Managed with lisinopril oat home, but BP is normal to low now so will hold. -aspirin EC tablet 81mg 5maily   3. Hyperlipidemia: Patient has reported history of hyperlipidemia, no medications. Most recent LDL: 125 (02/14/2012).  -Lipid panel tomorrow am   4. Polysubstance abuse: She has a history of cocaine, marijuana, and cigarette use. UDS positive for THC. Patient reports she last used cocaine 2 months ago -Nicoderm patch 7mg da23m   5. Major depression: Patient was appropriately upset and tearful at the pain she was currently experiencing. She has been managed at home with Wellbutrin and Risperdal.  -Wellbutrin 100mg PO27mly before breakfast  -Risperdal 0.25mg PO 34m  6. UTI: Urinalysis on 04/16/14 showed UTI, current UTI only notable for trace LE and rare bacteria -bactrim ppx should cover UTI  Dispo: Disposition is deferred at this time, awaiting improvement of current  medical problems.  Anticipated discharge in approximately 3 day(s).   The patient does have a current PCP ([facility] Arman [facility].  The patient does not know have transportation limitations that hinder transportation to clinic appointments.  .Services Needed at time of discharge: Y = Yes, Blank = No PT:   OT:   RN:   Equipment:   Other:  LOS: 1 day   Kelby Aline, MD 04/20/2014, 11:14 AM

## 2014-04-20 NOTE — Progress Notes (Signed)
Subjective: [name] is a 53yo woman with a past medical history of cocaine abuse, hypertension, major depression, and hyperlipidemia who presented yesterday (7/16) with of blackened painful necrotic lesions on her ears and the tip of her nose. She has experienced similar lesions before (vasculitis secondary to use of cocaine contaminated with levamisole) and has been successfully treated with intravenous steroids in the past. She was admitted and started on IV methylprednisolone yesterday. She is much improved today and feels that the pain has significantly gone down. Her spirits are also significantly improved. She denies any fever, chills, chest pain, shortness of breath, abdominal pain, or diarrhea.   Objective: Vital signs in last 24 hours: Filed Vitals:   04/19/14 1704 04/19/14 1957 04/20/14 0624  BP: 112/49  121/69  Pulse: 89 74 74  Temp: 97.5 F (36.4 C) 97.9 F (36.6 C) 97.6 F (36.4 C)  TempSrc: Oral Oral Oral  Resp: 18 18 18   Height: 5' 1"  (1.549 m)    Weight: 51.3 kg (113 lb 1.5 oz)    SpO2: 100% 95% 100%   Weight change:   Intake/Output Summary (Last 24 hours) at 04/20/14 0913 Last data filed at 04/20/14 0520  Gross per 24 hour  Intake    240 ml  Output   1200 ml  Net   -960 ml   General: resting in bed HEENT: no scleral icterus, continued rhinorrhea  Cardiac: RRR, no rubs, murmurs or gallops Pulm: clear to auscultation bilaterally, moving normal volumes of air Ext: warm and well perfused, no pedal edema Neuro: alert and oriented X3 Skin: blackened flaky palpable purpuric lesions on bilateral helices of the ears and on the tip of the nose, 3cm closing lesion on the left thigh, nearby closed 2cm lesion  Lab Results: CBC Latest Ref Rng 04/20/2014 04/19/2014 04/18/2014  WBC 4.0 - 10.5 K/uL 4.1 4.8 -  Hemoglobin 12.0 - 15.0 g/dL 9.8(L) 9.2(L) 12.9  Hematocrit 36.0 - 46.0 % 29.4(L) 27.2(L) 38.0  Platelets 150 - 400 K/uL 279 254 -   BMET    Component Value  Date/Time   NA 141 04/20/2014 0405   K 4.4 04/20/2014 0405   CL 102 04/20/2014 0405   CO2 23 04/20/2014 0405   GLUCOSE 173* 04/20/2014 0405   BUN 22 04/20/2014 0405   CREATININE 0.82 04/20/2014 0405   CREATININE 0.86 04/11/2014 1014   CALCIUM 8.9 04/20/2014 0405   GFRNONAA 81* 04/20/2014 0405   GFRNONAA 79 04/11/2014 1014   GFRAA >90 04/20/2014 0405   GFRAA >89 04/11/2014 1014   Drugs of Abuse     Component Value Date/Time   LABOPIA POSITIVE* 04/19/2014 2100   LABOPIA PPS 08/31/2012 1045   COCAINSCRNUR NONE DETECTED 04/19/2014 2100   COCAINSCRNUR NEG 08/31/2012 1045   LABBENZ NONE DETECTED 04/19/2014 2100   LABBENZ NEG 08/31/2012 1045   LABBENZ NEGATIVE 06/12/2011 0221   AMPHETMU NONE DETECTED 04/19/2014 2100   AMPHETMU NEGATIVE 06/12/2011 0221   THCU POSITIVE* 04/19/2014 2100   THCU 63.4* 05/20/2012 1405   LABBARB NONE DETECTED 04/19/2014 2100   LABBARB NEG 08/31/2012 1045    Urinalysis    Component Value Date/Time   COLORURINE YELLOW 04/19/2014 2100   APPEARANCEUR CLOUDY* 04/19/2014 2100   LABSPEC 1.029 04/19/2014 2100   PHURINE 5.5 04/19/2014 2100   GLUCOSEU NEGATIVE 04/19/2014 2100   HGBUR TRACE* 04/19/2014 2100   BILIRUBINUR NEGATIVE 04/19/2014 2100   KETONESUR 15* 04/19/2014 2100   PROTEINUR NEGATIVE 04/19/2014 2100   UROBILINOGEN 0.2 04/19/2014 2100  NITRITE NEGATIVE 04/19/2014 2100   LEUKOCYTESUR TRACE* 04/19/2014 2100   Urine microscopic add-on (7/16): few squamous epithelial cells, otherwise normal  Lab Results  Component Value Date   CREATININE 0.82 04/20/2014   CREATININE 1.07 04/19/2014   CREATININE 1.00 04/18/2014   HIV NONREACTIVE (07/16 1948)  Hepatitis Panel, Acute (07/16) Hepatitis B Surface antigen Negative HCV Antibody   Negative Hepatitis A IgM  Negative Hepatitis B C IgM  Negative  Lactic Acid, venous (7/16) 1.73  P-ANCA (06/28/12)  1:640  Micro Results: No results found for this or any previous visit (from the past 240 hour(s)). Studies/Results: Ct Soft Tissue  Neck W Contrast  04/18/2014   CLINICAL DATA:  Neck swelling.  EXAM: CT NECK WITH CONTRAST  TECHNIQUE: Multidetector CT imaging of the neck was performed using the standard protocol following the bolus administration of intravenous contrast.  CONTRAST:  146m OMNIPAQUE IOHEXOL 300 MG/ML  SOLN  COMPARISON:  CT 09/15/2010.  FINDINGS: Skull base is normal. Nasopharynx is patent. Mild tonsillar soft tissue fullness noted. Mild tonsillitis cannot be excluded that. Tongue base and tongue are unremarkable. The larynx is unremarkable. Trachea is widely patent. Thyroid is unremarkable. Parotid and submandibular glands normal. Multiple cervical lymph nodes are noted lymph nodes measure up to 6 mm. Of pulmonary apices are clear. The retropharyngeal space and bony structures are normal. Apical pleural parenchymal thickening noted most consistent with scarring. Similar finding noted on prior study.  IMPRESSION: Mild peritonsillar soft tissue fullness. Mild tonsillitis cannot be excluded. No other significant abnormality identified.   Electronically Signed   By: TMarcello Moores Register   On: 04/18/2014 19:19   Mm Digital Screening  04/19/2014   CLINICAL DATA:  Screening.  EXAM: DIGITAL SCREENING BILATERAL MAMMOGRAM WITH CAD  COMPARISON:  Previous exam(s).  ACR Breast Density Category c: The breast tissue is heterogeneously dense, which may obscure small masses.  FINDINGS: There are no findings suspicious for malignancy. Images were processed with CAD.  IMPRESSION: No mammographic evidence of malignancy. A result letter of this screening mammogram will be mailed directly to the patient.  RECOMMENDATION: Screening mammogram in one year. (Code:SM-[id number])  BI-RADS CATEGORY  1: Negative.   Electronically Signed   By: JHassan RowanM.D.   On: 04/19/2014 13:11   Medications: I have reviewed the patient's current medications. Scheduled Meds: . aspirin EC  81 mg Oral Daily  . ciprofloxacin  400 mg Intravenous Q12H  . enoxaparin (LOVENOX)  injection  40 mg Subcutaneous Q24H  . fluconazole  150 mg Oral Daily  . [START ON 04/21/2014] methylPREDNISolone (SOLU-MEDROL) injection  500 mg Intravenous Q24H  . nicotine  7 mg Transdermal Daily  . senna-docusate  1 tablet Oral QHS  . sodium chloride  3 mL Intravenous Q12H  . 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 Vasculitis - vasculitis associated with recurrent polychondritis presumed secondary to cocaine laced with levamisole. Positive for P-ANCA 1:640 (06/28/2012). Per rheumatology (7/16), levamisole antibody is associated with P-ANCA and nasal bulb vasculitis is pathognomonic for levamisole associated vasculitis. Considering improvement, continue pulse steroid treatment. Immunosuppresion requires prophylaxis. -Methylprednisolone 500 mg in sodium chloride 0.9% IV for 30 minutes Q12H -Diflucan 1573mtablet PO daily  -Bactrim 800-16061mablet PO daily -Ciprofloxacin 500m74m BID -Protonix 40mg59m prophylaxis -morphine 2mg/m92mnjection IV  Q2H PRN severe pain -Oxycodone immediate release tablet 23m PO Q4H PRN moderate pain -Zofran tablet 467mPO Q6H PRN nausea -DVT prophylaxis: Lovenox injection 4033mC Q24H -Quantiferon tb gold assay, pending  2. Hypertension Has a history of hypertension, currently managed with Lisinopril. BP on admission: 112/49. -aspirin EC tablet 15m74m daily -continue Lisinopril 20mg37mdaily  3. Hyperlipidemia History of hyperlipidemia, no medications. Most recent LDL: 125 (02/14/2012).  -Lipid panel, pending  4. Polysubstance abuse History of cocaine, marijuana, and cigarette use. Denies recent cocaine use. UDS (7/16) positive for THC and opiates (opiates iatrogenic).  -Nicoderm  patch 7mg d28my -Gabapentin 300mg P72mD for substance abuse withdrawal   5. Major depression Has been managed with Wellbutrin, Atarax/Vistaril PRN, and Risperdal. -Continue home medications:  -Wellbutrin 100mg PO46mly before breakfast -Atarax/vistaril 25mg PO 41mmes daily PRN anxiety/tension -Risperdal 0.25mg PO B42m6. UTI  Urinalysis obtained on 04/16/14 showed UTI and patient was prescribed Keflex. -Bactrim initiated for bacterial prophylaxis should cover UTI  Dispo: Disposition is deferred at this time, awaiting improvement of current medical problems.  Anticipated discharge in approximately 3 day(s).   The patient does have a current PCP ([facility]rm[facility].  The patient does not know have transportation limitations that hinder transportation to clinic appointments.  .Services Needed at time of discharge: Y = Yes, Blank = No PT:   OT:   RN:   Equipment:   Other:     LOS: 1 day   Aiya Keach J Alvina Chouent 04/20/2014, 9:13 AM

## 2014-04-20 NOTE — Progress Notes (Signed)
INTERNAL MEDICINE TEACHING ATTENDING ADDENDUM - Cristina Contes, MD: I personally saw and evaluated [name] in this clinic visit in conjunction with the resident, Dr. Trudee Kuster. I have discussed patient's plan of care with medical resident during this visit. 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 Will likely need oxy/prrcocet prn - case d/w patient

## 2014-04-20 NOTE — Care Management Note (Signed)
    Page 1 of 1   04/21/2014     9:17:36 AM CARE MANAGEMENT NOTE 04/21/2014  Patient:  MIRAYA, CUDNEY   Account Number:  [id number]  Date Initiated:  04/20/2014  Documentation initiated by:  AMERSON,JULIE  Subjective/Objective Assessment:   Pt adm on 04/19/14 with vasculitis secondary to cocaine use. PTA, pt independent of ADLS     Action/Plan:   Will follow for dc needs as pt progresses.  CSW to follow for substance abuse counseling.   Anticipated DC Date:  04/22/2014   Anticipated DC Plan:  HOME/SELF CARE  In-house referral  Clinical Social Worker      DC Planning Services  CM consult      Choice offered to / List presented to:             Status of service:  Completed, signed off Medicare Important Message given?   (If response is "NO", the following Medicare IM given date fields will be blank) Date Medicare IM given:   Medicare IM given by:   Date Additional Medicare IM given:   Additional Medicare IM given by:    Discharge Disposition:  HOME/SELF CARE  Per UR Regulation:  Reviewed for med. necessity/level of care/duration of stay  If discussed at [address] of Stay Meetings, dates discussed:    Comments:  04/21/14 09:15 CM notes pt has [employer] and therefore, sadly, is not eligible for med asst programs (i.e.MATCH).  No other CM needs were communicated.  Mariane Masters, BSN, CM [phone].

## 2014-04-20 NOTE — Progress Notes (Signed)
  I have seen and examined the patient, and reviewed the daily progress note by Veryl Speak, MS 3 and discussed the care of the patient with them. Please see my progress note from 04/20/2014 for further details regarding assessment and plan.    Signed:  Kelby Aline, MD 04/20/2014, 12:58 PM

## 2014-04-20 NOTE — H&P (Signed)
Attending physician admission note I personally interviewed and performed a detailed physical examination on this patient. History, physical findings, problem evaluation and management plan, accurate as recorded above by resident physician Dr. Lottie Mussel. Clinical summary: Pleasant but complicated 53 year old homeless woman who up until recently was smoking crack cocaine. She has not had any drug in the last 2 months. 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 She first manifested cutaneous vasculitis 5  years ago in September 2010. She had biopsies of her left ear which showed vasculitis with ulceration and necrosis and biopsy of the base of the tongue which showed similar findings. She has had intermittent positive ANAs. Positive P.-ANCA serology with a titer of 1:640 recorded in September .2013. She has had variably positive tests for the presence of the lupus type anticoagulant and elevated IgM anticardiolipin antibodies with negative antibodies to beta 2 glycoprotein 1 in July 2011. A cryoglobulin screen was negative  in July 2011. She states she was told she had rheumatoid arthritis but she has no inflammatory joint changes and a rheumatoid factor was undetectable in August 2010.  Marland KitchenShe has had a number of lesions on her legs and the skin of her feet. She required extensive debridement of the skin of the dorsum of her right foot. She has been on multiple courses of prednisone and recently colchicine. Colchicine stopped for diarrhea. She has had unusual involvement of the soft tissues/cartilage of her ears and the tip of her nose. According to rheumatology, the nose involvement is typical of lymphomatous or related vasculitis. She has some bilateral lesions on her thighs that were slowly healing.  She now presents with the abrupt onset of painful discoloration of both of her ears left greater than  right and the tip of her nose. Current exam: Petite African American woman who is tearful Blood pressure 134/73, pulse 78, temperature 97.7 F (36.5 C), temperature source Oral, resp. rate 20, height 5' 1"  (1.549 m), weight 113 lb 1.5 oz (51.3 kg), SpO2 100.00%. Dramatic vasculitic changes with ischemia of the entire cartilage of the left ear and partial involvement of the right pinna as well. Vasculitic changes on the tip of her nose over a 0.5 cm area. Atypical lesions on both of her thighs 2 on the left 1 on the right which do not have typical ischemic changes. No petechiae or purpura. No ischemic changes of her digits either hands or feet. Changes on the dorsum of her right foot where she had extensive debridement and skin graft  A blister on the left great toe which does not appear vasculitic. No ischemic changes of the fundi and optic discs are sharp. No joint deformities No lymphadenopathy or organomegaly No cardiac murmur No focal neurologic deficit Impression: Exacerbation of chronic levamisole related vasculitis Current lesions appear quite advanced and have appeared very abruptly. Involvement currently appears to be limited to the skin and cartilage. No other obvious end organ damage. Normal renal and hepatic function. Normochromic anemia without thrombocytopenia. Sedimentation rate only 23 mm. Plan: It is not clear how aggressive therapy needs to be at this time. We have consulted rheumatology, Dr. Leigh Aurora, by phone. He thinks it is quite reasonable to give her pulse high-dose steroids. Consideration of additional immunosuppressive therapy such as cyclophosphamide or other immunotherapy such as anti B cell. antibody in the form of rituximab will also be considered if she does not have a good  response to the steroids. We will initiate treatment with Solu-Medrol 500 mg daily x3 days. Antibacterial, antifungal, and PCP prophylaxis in view of anticipated significant  immunosuppression. PPI to protect from steroid related gastritis/ulcers.

## 2014-04-20 NOTE — Progress Notes (Signed)
Utilization review completed. Alic Hilburn, RN, BSN. 

## 2014-04-21 ENCOUNTER — Encounter (HOSPITAL_COMMUNITY): Payer: Self-pay | Admitting: General Practice

## 2014-04-21 LAB — CBC
HCT: 28.7 % — ABNORMAL LOW (ref 36.0–46.0)
HEMOGLOBIN: 9.4 g/dL — AB (ref 12.0–15.0)
MCH: 27.7 pg (ref 26.0–34.0)
MCHC: 32.8 g/dL (ref 30.0–36.0)
MCV: 84.7 fL (ref 78.0–100.0)
Platelets: 270 10*3/uL (ref 150–400)
RBC: 3.39 MIL/uL — ABNORMAL LOW (ref 3.87–5.11)
RDW: 13.9 % (ref 11.5–15.5)
WBC: 9.4 10*3/uL (ref 4.0–10.5)

## 2014-04-21 LAB — BASIC METABOLIC PANEL
Anion gap: 15 (ref 5–15)
BUN: 21 mg/dL (ref 6–23)
CHLORIDE: 99 meq/L (ref 96–112)
CO2: 22 mEq/L (ref 19–32)
CREATININE: 0.64 mg/dL (ref 0.50–1.10)
Calcium: 8.9 mg/dL (ref 8.4–10.5)
GFR calc Af Amer: >90 mL/min (ref >90)
GFR calc non Af Amer: >90 mL/min (ref >90)
GLUCOSE: 138 mg/dL — AB (ref 70–99)
POTASSIUM: 4.7 meq/L (ref 3.7–5.3)
Sodium: 136 mEq/L — ABNORMAL LOW (ref 137–147)

## 2014-04-21 LAB — LIPID PANEL
CHOL/HDL RATIO: 3.7 ratio
Cholesterol: 206 mg/dL — ABNORMAL HIGH (ref 0–200)
HDL: 56 mg/dL (ref >39)
LDL CALC: 139 mg/dL — AB (ref 0–99)
TRIGLYCERIDES: 56 mg/dL (ref <150)
VLDL: 11 mg/dL (ref 0–40)

## 2014-04-21 LAB — GLUCOSE, CAPILLARY
GLUCOSE-CAPILLARY: 128 mg/dL — AB (ref 70–99)
Glucose-Capillary: 201 mg/dL — ABNORMAL HIGH (ref 70–99)
Glucose-Capillary: 172 mg/dL — ABNORMAL HIGH (ref 70–99)

## 2014-04-21 LAB — QUANTIFERON TB GOLD ASSAY (BLOOD)
INTERFERON GAMMA RELEASE ASSAY: NEGATIVE
Mitogen value: 1.36 IU/mL
QUANTIFERON NIL VALUE: 0.02 [IU]/mL
TB Ag value: 0.01 IU/mL
TB Antigen Minus Nil Value: 0 IU/mL

## 2014-04-21 MED ORDER — WHITE PETROLATUM GEL
Status: DC | PRN
  Administered 2014-04-21: 0.2 via TOPICAL
  Filled 2014-04-21 (×2): qty 5

## 2014-04-21 NOTE — Progress Notes (Signed)
Subjective: NAEON. Pt continues to feel much improved. No new lesions have appeared. Ambulating better w/o pain.   Objective: Vital signs in last 24 hours: Filed Vitals:   04/20/14 0624 04/20/14 1358 04/20/14 2147 04/21/14 0400  BP: 121/69 134/73 136/69 146/83  Pulse: 74 78 78 69  Temp: 97.6 F (36.4 C) 97.7 F (36.5 C) 97.7 F (36.5 C) 98.5 F (36.9 C)  TempSrc: Oral Oral Oral Oral  Resp: 18 20 18 19   Height:      Weight:      SpO2: 100% 100% 99% 97%   Weight change:   Intake/Output Summary (Last 24 hours) at 04/21/14 0736 Last data filed at 04/21/14 0400  Gross per 24 hour  Intake    960 ml  Output   1500 ml  Net   -540 ml   General Appearance: Alert, cooperative, much better spirits this morning, appears older than age  53: Blackened flaky palpable purpuric lesions on bilateral helices of the ears and on the tip of the nose appear unchanged, PERRL, Moist mucous membranes  Neck: No carotid bruit or JVD  Back: Symmetric, no curvature  Lungs: Clear to auscultation bilaterally, respirations unlabored  Chest Wall: No tenderness or deformity  Heart: Regular rate and rhythm, S1 and S2 normal, no murmur, rub or gallop  Abdomen: Soft, non-tender, bowel sounds active all four quadrants, no masses, no organomegaly  Extremities: Scars on the bilateral feet and legs from prior surgical treatment of vasculitis, 3cm open lesion on the L thigh/leg, nearby closed 2cm lesion. Lesion 1 cm elliptical open area on R lateral leg. No edema  Pulses: 2+ and symmetric radial and DP pulses   Lab Results: Basic Metabolic Panel:  Recent Labs Lab 04/20/14 0405 04/21/14 0327  NA 141 136*  K 4.4 4.7  CL 102 99  CO2 23 22  GLUCOSE 173* 138*  BUN 22 21  CREATININE 0.82 0.64  CALCIUM 8.9 8.9   Liver Function Tests:  Recent Labs Lab 04/16/14 1916 04/20/14 0405  AST 16 16  ALT 10 10  ALKPHOS 99 100  BILITOT 0.2* <0.2*  PROT 7.5 7.0  ALBUMIN 3.4* 3.0*   CBC:  Recent  Labs Lab 04/16/14 1916  04/20/14 0405 04/21/14 0327  WBC 7.2  < > 4.1 9.4  NEUTROABS 4.8  --   --   --   HGB 11.2*  < > 9.8* 9.4*  HCT 32.7*  < > 29.4* 28.7*  MCV 85.4  < > 84.0 84.7  PLT 229  < > 279 270  < > = values in this interval not displayed. Cardiac Enzymes:  Recent Labs Lab 04/16/14 2116  TROPONINI <0.30   CBG:  Recent Labs Lab 04/20/14 0654 04/20/14 1123 04/20/14 1630 04/21/14 0604  GLUCAP 163* 170* 152* 128*   Fasting Lipid Panel:  Recent Labs Lab 04/21/14 0327  CHOL 206*  HDL 56  LDLCALC 139*  TRIG 56  CHOLHDL 3.7   Urine Drug Screen: Drugs of Abuse     Component Value Date/Time   LABOPIA POSITIVE* 04/19/2014 2100   LABOPIA PPS 08/31/2012 1045   COCAINSCRNUR NONE DETECTED 04/19/2014 2100   COCAINSCRNUR NEG 08/31/2012 1045   LABBENZ NONE DETECTED 04/19/2014 2100   LABBENZ NEG 08/31/2012 1045   LABBENZ NEGATIVE 06/12/2011 0221   AMPHETMU NONE DETECTED 04/19/2014 2100   AMPHETMU NEGATIVE 06/12/2011 0221   THCU POSITIVE* 04/19/2014 2100   THCU 63.4* 05/20/2012 1405   [place] DETECTED 04/19/2014 2100   LABBARB  NEG 08/31/2012 1045    Urinalysis:  Recent Labs Lab 04/16/14 2113 04/19/14 2100  COLORURINE YELLOW YELLOW  LABSPEC 1.022 1.029  PHURINE 7.0 5.5  GLUCOSEU NEGATIVE NEGATIVE  HGBUR TRACE* TRACE*  BILIRUBINUR NEGATIVE NEGATIVE  KETONESUR NEGATIVE 15*  PROTEINUR NEGATIVE NEGATIVE  UROBILINOGEN 0.2 0.2  NITRITE NEGATIVE NEGATIVE  LEUKOCYTESUR MODERATE* TRACE*   Misc. Labs: HIV negative Hep A,B,C neg quantiferon pending  Studies/Results:  Medications: I have reviewed the patient's current medications. Scheduled Meds: . aspirin EC  81 mg Oral Daily  . buPROPion  100 mg Oral QAC breakfast  . ciprofloxacin  400 mg Intravenous Q12H  . enoxaparin (LOVENOX) injection  40 mg Subcutaneous Q24H  . fluconazole  150 mg Oral Daily  . methylPREDNISolone (SOLU-MEDROL) injection  500 mg Intravenous Q24H  . nicotine  7 mg Transdermal Daily   . pantoprazole  40 mg Oral BID  . risperiDONE  0.25 mg Oral BID  . senna-docusate  1 tablet Oral QHS  . sodium chloride  3 mL Intravenous Q12H  . 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 She has had several episodes in the past secondary to cocaine laced with levamisole. She is cocaine negative on UDS but per rheumatology, these reactions can occur up to three months later (as long as antibodies are in serum) and she does admit to cocaine use several months ago. 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 She is responding well to methylpredisolone 500 mg daily  As her pain is much less intense today and necrosis of nose and ears has not spread. Rheumatology (Dr Ouida Sills) thinks she would benefit from 3 days of heavy steroids and will consider cyclophosphamide or rituximab as outpt unless she decompensates.  -appreciate rheumatology -Methylprednisolone 500 mg in sodium chloride 0.9% IV for 30 minutes Q24H x 3 d (will be stopped on 7/18) -transition to oral prednisone on 7/19 to be continued until rheum eval -Diflucan 166m tablet PO daily  -Bactrim 800-1638mtablet PO daily  -cipro 400 mg iv BID  -protonix 40 mg bid -morphine 62m56mL injection IV Q2H PRN severe pain  -Oxycodone immediate release tablet 5mg39m Q4H PRN moderate pain  -Zofran tablet 4mg 11mQ6H PRN nausea  -Quantiferon tb gold assay, pending  -anti-levamisole antibody was not able to be obtained  #Hypertension: continues to be relatively well controlled was managed with lisinopril at home -aspirin EC tablet 81mg 162maily  -consider adding back lisinopril  if becomes HTN  # History of Polysubstance abuse: She has a history of cocaine, marijuana, and cigarette use. UDS positive for THC. Patient reports she last used cocaine 2 months ago -Nicoderm patch 7mg da79m   #Major depression: Patient was appropriately upset and tearful at the pain she was currently experiencing. She has been managed at home with Wellbutrin and Risperdal.  -Wellbutrin 100mg PO28mly before breakfast  -Risperdal 0.25mg PO 30m  Dispo: Disposition is deferred at this time, awaiting improvement of current medical problems.  Anticipated discharge in approximately 3 day(s).   The patient does have a current PCP ([place] [facility].  The patient does not know have transportation limitations that hinder transportation to  clinic appointments.  .Services Needed at time of discharge: Y = Yes, Blank = No PT:   OT:   RN:   Equipment:   Other:     LOS: 2 days   Clinton Gallant, MD 04/21/2014, 7:36 AM

## 2014-04-21 NOTE — Plan of Care (Signed)
Problem: Phase I Progression Outcomes Goal: OOB as tolerated unless otherwise ordered Outcome: Progressing Patient has been ambulatory in [place] this shift.  Tolerated well.  Pain controlled with PO PRN analgesics.  No acute events occurred this shift.  Will continue to monitor patient condition.

## 2014-04-22 LAB — GLUCOSE, CAPILLARY: GLUCOSE-CAPILLARY: 128 mg/dL — AB (ref 70–99)

## 2014-04-22 MED ORDER — CIPROFLOXACIN HCL 500 MG PO TABS: 500.0000 mg | ORAL_TABLET | Freq: Two times a day (BID) | ORAL | Status: DC

## 2014-04-22 MED ORDER — OXYCODONE HCL 5 MG PO TABS: 5.0000 mg | ORAL_TABLET | ORAL | Status: DC | PRN

## 2014-04-22 MED ORDER — PREDNISONE 20 MG PO TABS
40.0000 mg | ORAL_TABLET | Freq: Every day | ORAL | Status: DC
  Filled 2014-04-22: qty 2

## 2014-04-22 MED ORDER — PANTOPRAZOLE SODIUM 40 MG PO TBEC: 40.0000 mg | DELAYED_RELEASE_TABLET | Freq: Two times a day (BID) | ORAL | Status: DC

## 2014-04-22 MED ORDER — PREDNISONE 20 MG PO TABS: 40.0000 mg | ORAL_TABLET | Freq: Every day | ORAL | Status: DC

## 2014-04-22 MED ORDER — CIPROFLOXACIN HCL 500 MG PO TABS
500.0000 mg | ORAL_TABLET | Freq: Two times a day (BID) | ORAL | Status: DC
  Administered 2014-04-22: 500 mg via ORAL
  Filled 2014-04-22 (×3): qty 1

## 2014-04-22 MED ORDER — FLUCONAZOLE 150 MG PO TABS: 150.0000 mg | ORAL_TABLET | Freq: Every day | ORAL | Status: DC

## 2014-04-22 MED ORDER — SULFAMETHOXAZOLE-TMP DS 800-160 MG PO TABS: 1.0000 | ORAL_TABLET | Freq: Every day | ORAL | Status: DC

## 2014-04-22 NOTE — Discharge Summary (Signed)
Name: [name] MRN: [id number] DOB: [birth year] 53 y.o. PCP: Arman Filter, MD  Date of Admission: 04/19/2014  4:48 PM Date of Discharge: 04/22/2014 Attending Physician: Annia Belt, MD  Discharge Diagnosis: 1. Acute vasculitis flare   Discharge Medications:   Medication List    STOP taking these medications       cephALEXin 500 MG capsule  Commonly known as:  KEFLEX     ketorolac 30 MG/ML injection  Commonly known as:  TORADOL      TAKE these medications       buPROPion 100 MG tablet  Commonly known as:  WELLBUTRIN  Take 1 tablet (100 mg total) by mouth daily before breakfast. For depression     ciprofloxacin 500 MG tablet  Commonly known as:  CIPRO  Take 1 tablet (500 mg total) by mouth 2 (two) times daily.     fluconazole 150 MG tablet  Commonly known as:  DIFLUCAN  Take 1 tablet (150 mg total) by mouth daily.     guaiFENesin 100 MG/5ML Soln  Commonly known as:  ROBITUSSIN  Take 10 mLs (200 mg total) by mouth every 4 (four) hours as needed for cough or to loosen phlegm.     HYDROcodone-ibuprofen 7.5-200 MG per tablet  Commonly known as:  VICOPROFEN  Take 1 tablet by mouth every 6 (six) hours as needed for severe pain.     hydrOXYzine 25 MG capsule  Commonly known as:  VISTARIL  Take 25 mg by mouth at bedtime.     lisinopril 20 MG tablet  Commonly known as:  PRINIVIL,ZESTRIL  Take 1 tablet (20 mg total) by mouth daily. For high blood pressure     pantoprazole 40 MG tablet  Commonly known as:  PROTONIX  Take 1 tablet (40 mg total) by mouth 2 (two) times daily.     predniSONE 20 MG tablet  Commonly known as:  DELTASONE  Take 2 tablets (40 mg total) by mouth daily with breakfast.  Start taking on:  04/23/2014     silver sulfADIAZINE 1 % cream  Commonly known as:  SILVADENE  Apply 1 application topically 2 (two) times daily.     sulfamethoxazole-trimethoprim 800-160 MG per tablet  Commonly known as:  BACTRIM DS  Take 1 tablet by mouth  daily.     traZODone 50 MG tablet  Commonly known as:  DESYREL  Take 1 tablet (50 mg total) by mouth at bedtime. For sleep        Disposition and follow-up:   Ms.Serayah D Lagares was discharged from [facility] in Stable condition.  At the hospital follow up visit please address:  1.  Please call to arrange Rheum follow up with Dr. Ouida Sills at [facility] he is aware and willing to accept the patient. Maybe able to wean off prophylaxis Abx for patient.   2.  Labs / imaging needed at time of follow-up: none  3.  Pending labs/ test needing follow-up: TB quantiferon   Consultations:   Phone only Rheum consult   Procedures Performed:  Dg Chest 2 View  04/16/2014   CLINICAL DATA:  Chest pain  EXAM: CHEST  2 VIEW  COMPARISON:  03/29/2014  FINDINGS: Normal heart size and upper mediastinal contours. There is chronic scarring present at the level of the mid right chest. There is no edema, consolidation, effusion, or pneumothorax. Bilateral nipple shadows.  IMPRESSION: No active cardiopulmonary disease.   Electronically Signed   By: Roderic Palau  Watts M.D.   On: 04/16/2014 22:04   Dg Chest 2 View  03/29/2014   CLINICAL DATA:  chest pain  EXAM: CHEST  2 VIEW  COMPARISON:  Two-view chest 02/17/2014.  FINDINGS: Low lung volumes. Discoid atelectasis versus scarring right mid hemi thorax, stable symmetric smoothly marginated rounded densities at the lung bases consistent with nipple shadows. Linear areas of density, mild, within the lung bases. The opacity in the left lung base and right perihilar region have decreased. No new focal regions of consolidation. No acute osseous abnormalities.  IMPRESSION: Resolving infiltrates right perihilar and left lung base.  Minimal scarring versus atelectasis within the lung bases  Discoid atelectasis versus scar right mid hemithorax.   Electronically Signed   By: Margaree Mackintosh M.D.   On: 03/29/2014 13:27   Ct Soft Tissue Neck W  Contrast  04/18/2014   CLINICAL DATA:  Neck swelling.  EXAM: CT NECK WITH CONTRAST  TECHNIQUE: Multidetector CT imaging of the neck was performed using the standard protocol following the bolus administration of intravenous contrast.  CONTRAST:  149m OMNIPAQUE IOHEXOL 300 MG/ML  SOLN  COMPARISON:  CT 09/15/2010.  FINDINGS: Skull base is normal. Nasopharynx is patent. Mild tonsillar soft tissue fullness noted. Mild tonsillitis cannot be excluded that. Tongue base and tongue are unremarkable. The larynx is unremarkable. Trachea is widely patent. Thyroid is unremarkable. Parotid and submandibular glands normal. Multiple cervical lymph nodes are noted lymph nodes measure up to 6 mm. Of pulmonary apices are clear. The retropharyngeal space and bony structures are normal. Apical pleural parenchymal thickening noted most consistent with scarring. Similar finding noted on prior study.  IMPRESSION: Mild peritonsillar soft tissue fullness. Mild tonsillitis cannot be excluded. No other significant abnormality identified.   Electronically Signed   By: TMarcello Moores Register   On: 04/18/2014 19:19   Mm Digital Screening  04/19/2014   CLINICAL DATA:  Screening.  EXAM: DIGITAL SCREENING BILATERAL MAMMOGRAM WITH CAD  COMPARISON:  Previous exam(s).  ACR Breast Density Category c: The breast tissue is heterogeneously dense, which may obscure small masses.  FINDINGS: There are no findings suspicious for malignancy. Images were processed with CAD.  IMPRESSION: No mammographic evidence of malignancy. A result letter of this screening mammogram will be mailed directly to the patient.  RECOMMENDATION: Screening mammogram in one year. (Code:SM-B-01Y)  BI-RADS CATEGORY  1: Negative.   Electronically Signed   By: JHassan RowanM.D.   On: 04/19/2014 13:11    Admission HPI: [name] a 53year old woman with a past medical history of cocaine abuse, hypertension, major depression, and rheumatoid arthritis who presents today with a one day  history of blackened painful lesions on her ears and the tip of her nose. She notes that these lesions [facility] and in both of her legs. These lesions are so painful that they induced crying throughout part of the exam. She noted [facility]. They also feel better when she presses on them. They feel worse when they are touched or scraped. She has had similar lesions before, on and off, over the last two years. She has been told that this is vasculitis related to using cocaine that has been cut with another substance. She has been hospitalized for this same problem multiple times within the last two years and has responded well to intravenous steroids. She has  also had some sanguinous nasal discharge. She notes two other lesions on her left thigh and one on her right thigh that are also painful. She has diffuse calf pain bilaterally that makes it difficult for her to walk. The lesions have not bled or grown in size. Although she knows that this vasculitis has been attributed to cocaine use in the past, she reports no cocaine use for 2 months. She also has had some weakness in her legs and has difficulty standing up from sitting. She was given cr[facility] at [facility] last night and she says that she has been using them for support in standing and once she stands up she is then able to walk without difficulty. She notes joint pain in her knees. She denies chest pain, palpitations, SOB, abdominal pain, nausea, vomiting, fever, chills, sore throat, difficulty swallowing, or involuntary lacrimation.   Hospital Course by problem list: # Vasculitis: She has had several episodes in the past secondary to cocaine laced with levamisole. She is cocaine negative on UDS and states that she has been clean for 2 months. In terms of AI workup she has had ANA that has been both positive and  negative most recently on 04/11/14. In 06/2012, she was positive for p-ANCA 1:640 but negative for c-ANCA or ANA. Per conversation with rheumatologist Dr. Ouida Sills, anti-levamisole antibodies (these were not able to be ordered inpt) can be in the system for months and this presentation (necrotic bulb of nose) is almost pathognomonic. Given severity of lesions and active necrosis she was started with recommendation of rheumatologist IV solumedrol 582m qd and then transition to oral steroids until follow up.This immunosuppression will require prophylaxis with diflucan and bactrim and cipro. This maybe able to titrate off at follow up as the patient will be on less intense immunosuppression. Pt was counseled extensively on cocaine cessation and pt is dedicated to complete abstinence. Pain was controlled with oxyIR and pt was discharged with limited sample. HIV, Hepatitis all negative. TB quantiferon was pending in anticipation pt may need biologics.   #HTN: pt had stable BP and was restarted on lisinopril 251mand ASA.   #Major depression: Patient was appropriately upset and tearful at the pain she was currently experiencing. She has been managed at home with Wellbutrin, Atarax/Vistaril PRN, and Risperdal.  Discharge Vitals:   BP 179/83  Pulse 66  Temp(Src) 98.5 F (36.9 C) (Oral)  Resp 18  Ht 5' 1"  (1.549 m)  Wt 113 lb 1.5 oz (51.3 kg)  BMI 21.38 kg/m2  SpO2 100% General Appearance: Alert, cooperative, much better spirits this morning, appears older than age  HEENT: Blackened flaky palpable purpuric lesions on bilateral helices of the ears and on the tip of the nose appear unchanged, PERRL, Moist mucous membranes  Neck: No carotid bruit or JVD  Back: Symmetric, no curvature  Lungs: Clear to auscultation bilaterally, respirations unlabored  Chest Wall: No tenderness or deformity  Heart: Regular rate and rhythm, S1 and S2 normal, no murmur, rub or gallop  Abdomen: Soft, non-tender, bowel sounds  active all four quadrants, no masses, no organomegaly  Extremities: Scars on the bilateral feet and legs from prior surgical treatment of vasculitis, 3cm open lesion on the L thigh/leg, nearby closed 2cm lesion. Lesion 1 cm elliptical open area on R lateral leg. No edema  Pulses: 2+ and symmetric radial and DP pulses   Discharge Labs:  Results for orders placed during the hospital encounter of 04/19/14 (from the past 24 hour(s))  GLUCOSE,  CAPILLARY     Status: Abnormal   Collection Time    04/21/14 11:20 AM      Result Value Ref Range   Glucose-Capillary 201 (*) 70 - 99 mg/dL   Comment 1 Notify RN    GLUCOSE, CAPILLARY     Status: Abnormal   Collection Time    04/21/14  4:20 PM      Result Value Ref Range   Glucose-Capillary 172 (*) 70 - 99 mg/dL  GLUCOSE, CAPILLARY     Status: Abnormal   Collection Time    04/22/14  6:03 AM      Result Value Ref Range   Glucose-Capillary 128 (*) 70 - 99 mg/dL    Signed: Clinton Gallant, MD 04/22/2014, 10:02 AM    Services Ordered on Discharge: none Equipment Ordered on Discharge: none

## 2014-04-22 NOTE — Progress Notes (Signed)
Discharged to home with family office visits in place teaching done  

## 2014-04-22 NOTE — Progress Notes (Signed)
[name], female   DOB: 02/04/1961, 53 y.o.   MRN: [id number] Attending physician discharge note: I personally interviewed and examined this patient on the day of discharge. Hospital course, pertinent findings, discharge plans, accurate as recorded by resident physician Dr. Clinton Gallant. Summary:  Rather complicated 53 year old woman who has used cocaine intermittently for many years. She stopped all cocaine use 2 months ago. She presented with vasculitic lesions of the ear and base of tongue 5 years ago in September, 2010. Both areas were biopsied and confirmed vasculitis. Subsequent studies consistent with levamisole induced antibodies. Levamisole has been used to cut currently available cocaine products. She subsequently developed additional lesions on her skin, primarily the lower extremities. She required extensive debridement of necrotic tissue on the dorsum of her right foot in the past. She presented at the time of the current admission with an acute exacerbation of vasculitis with rapid development of painful ischemic changes of her ears, left greater than right, and the tip of her nose. Fortunately, lab profile does not show any and organ damage and she has normal renal and hepatic function. Following phone consultation with rheumatology, she began a trial of high-dose steroids in the form of Solu-Medrol 500 mg IV daily x3. She was put on prophylactic antimicrobial and antifungal agents in view of anticipated immunosuppression. She tolerated the steroids well. Not much change in the gross lesions with some progression of the lesion at the tip of her nose at time of discharge. No new lesions developed while on the steroids. She will be discharged on oral prednisone and Dr. Burnard Leigh, rheumatology, has graciously agreed to see her as an outpatient for further advice on treatment. We are considering a trial of rituximab antibody if Dr. Ouida Sills feels this is appropriate.

## 2014-04-22 NOTE — Progress Notes (Signed)
Subjective: [name]. Pt continues to feel much improved. No new lesions have appeared. Feels ready to go home.   Objective: Vital signs in last 24 hours: Filed Vitals:   04/21/14 1345 04/21/14 2029 04/22/14 0530 04/22/14 0532  BP: 145/63 180/90 179/86 179/83  Pulse: 80 85  66  Temp: 97.8 F (36.6 C) 98.8 F (37.1 C)  98.5 F (36.9 C)  TempSrc: Oral Oral  Oral  Resp: 20 21  18   Height:      Weight:      SpO2: 100% 97%  100%   Weight change:   Intake/Output Summary (Last 24 hours) at 04/22/14 1000 Last data filed at 04/22/14 0730  Gross per 24 hour  Intake   1010 ml  Output   1500 ml  Net   -490 ml   General Appearance: Alert, cooperative, much better spirits this morning, appears older than age  76: Blackened flaky palpable purpuric lesions on bilateral helices of the ears and on the tip of the nose appear unchanged, PERRL, Moist mucous membranes  Neck: No carotid bruit or JVD  Back: Symmetric, no curvature  Lungs: Clear to auscultation bilaterally, respirations unlabored  Chest Wall: No tenderness or deformity  Heart: Regular rate and rhythm, S1 and S2 normal, no murmur, rub or gallop  Abdomen: Soft, non-tender, bowel sounds active all four quadrants, no masses, no organomegaly  Extremities: Scars on the bilateral feet and legs from prior surgical treatment of vasculitis, 3cm open lesion on the L thigh/leg, nearby closed 2cm lesion. Lesion 1 cm elliptical open area on R lateral leg. No edema  Pulses: 2+ and symmetric radial and DP pulses   Lab Results: Basic Metabolic Panel:  Recent Labs Lab 04/20/14 0405 04/21/14 0327  NA 141 136*  K 4.4 4.7  CL 102 99  CO2 23 22  GLUCOSE 173* 138*  BUN 22 21  CREATININE 0.82 0.64  CALCIUM 8.9 8.9   Liver Function Tests:  Recent Labs Lab 04/16/14 1916 04/20/14 0405  AST 16 16  ALT 10 10  ALKPHOS 99 100  BILITOT 0.2* <0.2*  PROT 7.5 7.0  ALBUMIN 3.4* 3.0*   CBC:  Recent Labs Lab 04/16/14 1916   04/20/14 0405 04/21/14 0327  WBC 7.2  < > 4.1 9.4  NEUTROABS 4.8  --   --   --   HGB 11.2*  < > 9.8* 9.4*  HCT 32.7*  < > 29.4* 28.7*  MCV 85.4  < > 84.0 84.7  PLT 229  < > 279 270  < > = values in this interval not displayed. Cardiac Enzymes:  Recent Labs Lab 04/16/14 2116  TROPONINI <0.30   CBG:  Recent Labs Lab 04/20/14 1123 04/20/14 1630 04/21/14 0604 04/21/14 1120 04/21/14 1620 04/22/14 0603  GLUCAP 170* 152* 128* 201* 172* 128*   Fasting Lipid Panel:  Recent Labs Lab 04/21/14 0327  CHOL 206*  HDL 56  LDLCALC 139*  TRIG 56  CHOLHDL 3.7   Urine Drug Screen: Drugs of Abuse     Component Value Date/Time   LABOPIA POSITIVE* 04/19/2014 2100   LABOPIA PPS 08/31/2012 1045   COCAINSCRNUR NONE DETECTED 04/19/2014 2100   COCAINSCRNUR NEG 08/31/2012 1045   LABBENZ NONE DETECTED 04/19/2014 2100   LABBENZ NEG 08/31/2012 1045   LABBENZ NEGATIVE 06/12/2011 0221   AMPHETMU NONE DETECTED 04/19/2014 2100   AMPHETMU NEGATIVE 06/12/2011 0221   THCU POSITIVE* 04/19/2014 2100   THCU 63.4* 05/20/2012 1405   [place] DETECTED 04/19/2014 2100  LABBARB NEG 08/31/2012 1045    Urinalysis:  Recent Labs Lab 04/16/14 2113 04/19/14 2100  COLORURINE YELLOW YELLOW  LABSPEC 1.022 1.029  PHURINE 7.0 5.5  GLUCOSEU NEGATIVE NEGATIVE  HGBUR TRACE* TRACE*  BILIRUBINUR NEGATIVE NEGATIVE  KETONESUR NEGATIVE 15*  PROTEINUR NEGATIVE NEGATIVE  UROBILINOGEN 0.2 0.2  NITRITE NEGATIVE NEGATIVE  LEUKOCYTESUR MODERATE* TRACE*   Misc. Labs: HIV negative Hep A,B,C neg quantiferon pending  Studies/Results:  Medications: I have reviewed the patient's current medications. Scheduled Meds: . aspirin EC  81 mg Oral Daily  . buPROPion  100 mg Oral QAC breakfast  . ciprofloxacin  500 mg Oral BID  . enoxaparin (LOVENOX) injection  40 mg Subcutaneous Q24H  . fluconazole  150 mg Oral Daily  . nicotine  7 mg Transdermal Daily  . pantoprazole  40 mg Oral BID  . [START ON 04/23/2014]  predniSONE  40 mg Oral Q breakfast  . risperiDONE  0.25 mg Oral BID  . senna-docusate  1 tablet Oral QHS  . sodium chloride  3 mL Intravenous Q12H  . 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 She has had several episodes in the past secondary to cocaine laced with levamisole. She is cocaine negative on UDS but per rheumatology, these reactions can occur up to three months later (as long as antibodies are in serum) and she does admit to cocaine use several months ago. 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 She is responding well to methylpredisolone 500 mg daily  As her pain is much less intense today and necrosis of nose and ears has not spread. Rheumatology (Dr Ouida Sills) thinks she would benefit from 3 days of heavy steroids and will consider cyclophosphamide or rituximab as outpt unless she decompensates.  -appreciate rheumatology -transition to oral prednisone on 7/19 to be continued until rheum eval -Diflucan 176m tablet PO daily  -Bactrim 800-167mtablet PO daily  -cipro 400 mg iv BID  -protonix 40 mg bid -morphine 61m66mL injection IV Q2H PRN severe pain  -Oxycodone immediate release tablet 5mg21m Q4H PRN moderate pain  -Zofran tablet 4mg 57mQ6H PRN nausea  -Quantiferon tb gold assay, pending  -anti-levamisole antibody was not able to be obtained  #Hypertension: continues to be relatively well controlled was managed with lisinopril at home -aspirin EC tablet 81mg 87maily  -add back her lisinopril  # History of Polysubstance abuse: She has a history of cocaine, marijuana, and cigarette use. UDS positive for  THC. Patient reports she last used cocaine 2 months ago -Nicoderm patch 7mg da41m   #Major depression: Patient was appropriately upset and tearful at the pain she was currently experiencing. She has been managed at home with Wellbutrin and Risperdal.  -Wellbutrin 100mg PO56mly before breakfast  -Risperdal 0.25mg PO 10m  Dispo: Disposition is deferred at this time, awaiting improvement of current medical problems.  Anticipated discharge in approximately 3 day(s).   The patient does have a current PCP ([facility]verett Arman [facility].  The patient does not know have transportation limitations that hinder transportation to clinic appointments.  .Services Needed at time of discharge: Y = Yes, Blank = No PT:   OT:  RN:   Equipment:   Other:     LOS: 3 days   Clinton Gallant, MD 04/22/2014, 10:00 AM

## 2014-04-23 NOTE — Discharge Summary (Signed)
I personally examined this patient on the day of discharge and reviewed discharge plans with resident physicians.

## 2014-04-30 ENCOUNTER — Encounter: Payer: Self-pay | Admitting: Internal Medicine

## 2014-04-30 ENCOUNTER — Ambulatory Visit (INDEPENDENT_AMBULATORY_CARE_PROVIDER_SITE_OTHER): Payer: Medicaid Other | Admitting: Internal Medicine

## 2014-04-30 VITALS — BP 102/60 | HR 98 | Temp 97.3°F | Ht 61.5 in | Wt 125.3 lb

## 2014-04-30 DIAGNOSIS — L988 Other specified disorders of the skin and subcutaneous tissue: Secondary | ICD-10-CM

## 2014-04-30 DIAGNOSIS — I10 Essential (primary) hypertension: Secondary | ICD-10-CM

## 2014-04-30 DIAGNOSIS — L959 Vasculitis limited to the skin, unspecified: Secondary | ICD-10-CM

## 2014-04-30 DIAGNOSIS — F331 Major depressive disorder, recurrent, moderate: Secondary | ICD-10-CM

## 2014-04-30 MED ORDER — TRAZODONE HCL 50 MG PO TABS: 50.0000 mg | ORAL_TABLET | Freq: Every day | ORAL | Status: DC

## 2014-04-30 MED ORDER — LISINOPRIL 20 MG PO TABS: 20.0000 mg | ORAL_TABLET | Freq: Every day | ORAL | Status: DC

## 2014-04-30 MED ORDER — OXYCODONE HCL 5 MG PO TABS: 5.0000 mg | ORAL_TABLET | ORAL | Status: DC | PRN

## 2014-04-30 NOTE — Assessment & Plan Note (Signed)
Mood improved today after getting vasculitis under control.  She says Wellbutrin has been helping with mood and trazodone helps her sleep at night.  Living situation has been improving, and she is excited about moving into her own apartment.  It appears her roommate is supportive, and the positive environment may help her mood. -Refilled trazodone 50 mg QHS. -Continue Wellbutrin 100 mg daily.

## 2014-04-30 NOTE — Assessment & Plan Note (Signed)
BP Readings from Last 3 Encounters:  04/30/14 102/60  04/22/14 179/83  04/19/14 92/66    Lab Results  Component Value Date   NA 136* 04/21/2014   K 4.7 04/21/2014   CREATININE 0.64 04/21/2014    Assessment: Blood pressure control: controlled Progress toward BP goal:  at goal Comments: Needs refill of lisinopril.  Plan: Medications:  continue current medications: lisinopril 20 mg daily.

## 2014-04-30 NOTE — Progress Notes (Signed)
   Subjective:    [name], female    DOB: 11-11-1960, 53 y.o.   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  She reports feeling better since being admitted to the hospital. She says her nose and left ear are healing, and she has no new sites of ischemia. She is currently taking prednisone 20 mg BID and applying Silvadene to her nose and ear.  She continues to have pain in her nose, ear and feet and has been taking oxycodone 5 mg twice a day to help relieve the pain.  She says that she needs refills of oxycodone, trazodone, and lisinopril.  She is still living at [facility], but she is getting a shared apartment on Saturday.  She is looking forward to having her own home and says the apartment is in a good location with a supportive roommate.     Review of Systems  Constitutional: Positive for chills and diaphoresis. Negative for fever and activity change.  HENT: Negative for congestion, nosebleeds and rhinorrhea.   Eyes: Negative for visual disturbance.  Respiratory: Positive for cough. Negative for chest tightness and shortness of breath.   Cardiovascular: Negative for chest pain and leg swelling.  Gastrointestinal: Negative for abdominal pain, diarrhea and constipation.  Genitourinary: Negative for dysuria and difficulty urinating.  Musculoskeletal: Negative for arthralgias and myalgias.  Skin: Negative for rash.  Neurological: Negative for dizziness, syncope and headaches.  Psychiatric/Behavioral: Negative for agitation.       Objective:   Physical Exam  Constitutional: She is oriented to person, place, and time. She appears well-developed and well-nourished. No distress.  HENT:    Dusky ears bilaterally and tip of nose.  Left ear and nose with clean and healing ulceration.  Eyes: Conjunctivae and EOM are normal. Pupils are equal, round, and reactive to light.  Neck: Normal range of motion. Neck supple.  Cardiovascular: Normal rate and regular rhythm.   Pulmonary/Chest: Effort normal and breath sounds normal.  Abdominal: Soft. Bowel sounds are normal. She exhibits no distension. There is no tenderness.  Musculoskeletal: Normal range of motion. She exhibits no edema.  Black ischemic skin on dorsum of feet bilaterally below first toe.  Neurological: She is alert and oriented to person, place, and time. No cranial nerve deficit.  Skin: Skin is warm and dry. She is not diaphoretic.  Psychiatric: She has a normal mood and affect.          Assessment & Plan:  Please see problem-based assessment and plan.

## 2014-04-30 NOTE — Patient Instructions (Signed)
Thank you for coming to clinic today [name].  General instructions: -We contacted Dr. Ouida Sills, a rheumatologist at [facility], and he has agreed to see you. We will fax him your information, and they will contact you to schedule an appointment.  Their number is [phone]. -Continue to take prednisone 20 mg twice a day until you see the rheumatologist. -I sent refills for lisinopril and trazodone to your pharmacy. -Try to cut back the oxycodone you need for pain.  I will give you enough for 2 more weeks today. -Please make a follow up appointment to return to clinic in 2 weeks, you can cancel the appointment if your pain has improved.  Please bring your medicines with you each time you come.   Medicines may be  Eye drops  Herbal   Vitamins  Pills  Seeing these help [place] take care of you.

## 2014-04-30 NOTE — Assessment & Plan Note (Addendum)
Patient doing significantly better compared with prior to hospitalization.  No new areas of vasculitis or progression of disease since starting steroids again. She denies cocaine use for the past 3 months and says she does not feel tempted to start using again.  Levamisole can remain in the system for several months after stopping cocaine, which may explain her recent flare after coming off steroids. She may benefit from biologics to avoid using steroids chronically, but will continue steroids until her follow up with rheumatology. -Contacted Dr. Ouida Sills of [facility] to schedule follow up.  They agreed to accept the patient and will contact patient to schedule an appointment after the patient's records have been faxed over.  [name] will fax the records after the appointment today. -Continue prednisone 20 mg BID until rheumatology follow up. -Will continue antibiotic prophylaxis with Cipro, fluconazole, and Bactrim for now due to possible immune suppression from high dose steroids. -Refilled oxycodone 5 mg q4h PRN for pain due to ischemia. -Follow up in 2 weeks if pain continues.

## 2014-05-02 NOTE — Progress Notes (Signed)
I saw and evaluated the patient.  I personally confirmed the key portions of the history and exam documented by Dr. Trudee Kuster and I reviewed pertinent patient test results.  The assessment, diagnosis, and plan were formulated together and I agree with the documentation in the resident's note.

## 2014-05-07 ENCOUNTER — Emergency Department (HOSPITAL_COMMUNITY)
Admission: EM | Admit: 2014-05-07 | Discharge: 2014-05-07 | Disposition: A | Discharge status: Home / Self Care | Payer: Medicaid Other | Attending: Emergency Medicine | Admitting: Emergency Medicine

## 2014-05-07 ENCOUNTER — Observation Stay (HOSPITAL_COMMUNITY)
Admission: AD | Admit: 2014-05-07 | Discharge: 2014-05-08 | Disposition: A | Discharge status: Home / Self Care | Payer: Medicaid Other | Source: Intra-hospital | Attending: Family | Admitting: Family

## 2014-05-07 ENCOUNTER — Encounter (HOSPITAL_COMMUNITY): Payer: Self-pay | Admitting: Emergency Medicine

## 2014-05-07 ENCOUNTER — Emergency Department (HOSPITAL_COMMUNITY): Payer: Medicaid Other

## 2014-05-07 DIAGNOSIS — F1210 Cannabis abuse, uncomplicated: Secondary | ICD-10-CM | POA: Diagnosis not present

## 2014-05-07 DIAGNOSIS — R000 Tachycardia, unspecified: Secondary | ICD-10-CM | POA: Diagnosis not present

## 2014-05-07 DIAGNOSIS — F332 Major depressive disorder, recurrent severe without psychotic features: Secondary | ICD-10-CM | POA: Diagnosis present

## 2014-05-07 DIAGNOSIS — F329 Major depressive disorder, single episode, unspecified: Secondary | ICD-10-CM | POA: Diagnosis present

## 2014-05-07 DIAGNOSIS — R05 Cough: Secondary | ICD-10-CM | POA: Insufficient documentation

## 2014-05-07 DIAGNOSIS — F1910 Other psychoactive substance abuse, uncomplicated: Secondary | ICD-10-CM

## 2014-05-07 DIAGNOSIS — R079 Chest pain, unspecified: Secondary | ICD-10-CM | POA: Insufficient documentation

## 2014-05-07 DIAGNOSIS — F1310 Sedative, hypnotic or anxiolytic abuse, uncomplicated: Secondary | ICD-10-CM | POA: Diagnosis not present

## 2014-05-07 DIAGNOSIS — D649 Anemia, unspecified: Secondary | ICD-10-CM | POA: Insufficient documentation

## 2014-05-07 DIAGNOSIS — R45851 Suicidal ideations: Secondary | ICD-10-CM | POA: Diagnosis not present

## 2014-05-07 DIAGNOSIS — F1994 Other psychoactive substance use, unspecified with psychoactive substance-induced mood disorder: Secondary | ICD-10-CM | POA: Diagnosis not present

## 2014-05-07 DIAGNOSIS — G43909 Migraine, unspecified, not intractable, without status migrainosus: Secondary | ICD-10-CM | POA: Insufficient documentation

## 2014-05-07 DIAGNOSIS — F3289 Other specified depressive episodes: Secondary | ICD-10-CM | POA: Diagnosis not present

## 2014-05-07 DIAGNOSIS — F1010 Alcohol abuse, uncomplicated: Secondary | ICD-10-CM | POA: Insufficient documentation

## 2014-05-07 DIAGNOSIS — Z792 Long term (current) use of antibiotics: Secondary | ICD-10-CM | POA: Insufficient documentation

## 2014-05-07 DIAGNOSIS — M069 Rheumatoid arthritis, unspecified: Secondary | ICD-10-CM | POA: Insufficient documentation

## 2014-05-07 DIAGNOSIS — F1410 Cocaine abuse, uncomplicated: Secondary | ICD-10-CM | POA: Diagnosis not present

## 2014-05-07 DIAGNOSIS — R059 Cough, unspecified: Secondary | ICD-10-CM | POA: Insufficient documentation

## 2014-05-07 DIAGNOSIS — L988 Other specified disorders of the skin and subcutaneous tissue: Secondary | ICD-10-CM | POA: Diagnosis not present

## 2014-05-07 DIAGNOSIS — F411 Generalized anxiety disorder: Secondary | ICD-10-CM | POA: Diagnosis not present

## 2014-05-07 DIAGNOSIS — Z8701 Personal history of pneumonia (recurrent): Secondary | ICD-10-CM | POA: Diagnosis not present

## 2014-05-07 DIAGNOSIS — IMO0002 Reserved for concepts with insufficient information to code with codable children: Secondary | ICD-10-CM | POA: Diagnosis not present

## 2014-05-07 DIAGNOSIS — Z79899 Other long term (current) drug therapy: Secondary | ICD-10-CM | POA: Diagnosis not present

## 2014-05-07 DIAGNOSIS — F17200 Nicotine dependence, unspecified, uncomplicated: Secondary | ICD-10-CM | POA: Diagnosis not present

## 2014-05-07 DIAGNOSIS — Z862 Personal history of diseases of the blood and blood-forming organs and certain disorders involving the immune mechanism: Secondary | ICD-10-CM | POA: Insufficient documentation

## 2014-05-07 DIAGNOSIS — I10 Essential (primary) hypertension: Secondary | ICD-10-CM | POA: Diagnosis not present

## 2014-05-07 DIAGNOSIS — M064 Inflammatory polyarthropathy: Secondary | ICD-10-CM | POA: Diagnosis not present

## 2014-05-07 DIAGNOSIS — F331 Major depressive disorder, recurrent, moderate: Secondary | ICD-10-CM

## 2014-05-07 LAB — URINALYSIS, ROUTINE W REFLEX MICROSCOPIC
BILIRUBIN URINE: NEGATIVE
Glucose, UA: NEGATIVE mg/dL
KETONES UR: NEGATIVE mg/dL
NITRITE: NEGATIVE
Protein, ur: NEGATIVE mg/dL
SPECIFIC GRAVITY, URINE: 1.026 (ref 1.005–1.030)
Urobilinogen, UA: 0.2 mg/dL (ref 0.0–1.0)
pH: 5.5 (ref 5.0–8.0)

## 2014-05-07 LAB — CBC
HCT: 41 % (ref 36.0–46.0)
Hemoglobin: 14.1 g/dL (ref 12.0–15.0)
MCH: 29.4 pg (ref 26.0–34.0)
MCHC: 34.4 g/dL (ref 30.0–36.0)
MCV: 85.4 fL (ref 78.0–100.0)
Platelets: 251 10*3/uL (ref 150–400)
RBC: 4.8 MIL/uL (ref 3.87–5.11)
RDW: 16 % — ABNORMAL HIGH (ref 11.5–15.5)
WBC: 8.9 10*3/uL (ref 4.0–10.5)

## 2014-05-07 LAB — COMPREHENSIVE METABOLIC PANEL
ALK PHOS: 90 U/L (ref 39–117)
ALT: 17 U/L (ref 0–35)
ANION GAP: 16 — AB (ref 5–15)
AST: 14 U/L (ref 0–37)
Albumin: 3.9 g/dL (ref 3.5–5.2)
BUN: 33 mg/dL — AB (ref 6–23)
CO2: 20 mEq/L (ref 19–32)
Calcium: 9.8 mg/dL (ref 8.4–10.5)
Chloride: 98 mEq/L (ref 96–112)
Creatinine, Ser: 1.42 mg/dL — ABNORMAL HIGH (ref 0.50–1.10)
GFR calc Af Amer: 49 mL/min — ABNORMAL LOW (ref >90)
GFR calc non Af Amer: 42 mL/min — ABNORMAL LOW (ref >90)
Glucose, Bld: 112 mg/dL — ABNORMAL HIGH (ref 70–99)
POTASSIUM: 4.6 meq/L (ref 3.7–5.3)
SODIUM: 134 meq/L — AB (ref 137–147)
TOTAL PROTEIN: 7.7 g/dL (ref 6.0–8.3)
Total Bilirubin: 0.5 mg/dL (ref 0.3–1.2)

## 2014-05-07 LAB — RAPID URINE DRUG SCREEN, HOSP PERFORMED
Amphetamines: NOT DETECTED
BARBITURATES: NOT DETECTED
BENZODIAZEPINES: NOT DETECTED
COCAINE: POSITIVE — AB
Opiates: NOT DETECTED
Tetrahydrocannabinol: POSITIVE — AB

## 2014-05-07 LAB — TROPONIN I: Troponin I: <0.3 ng/mL (ref <0.30)

## 2014-05-07 LAB — URINE MICROSCOPIC-ADD ON

## 2014-05-07 LAB — PROTIME-INR
INR: 1.02 (ref 0.00–1.49)
PROTHROMBIN TIME: 13.4 s (ref 11.6–15.2)

## 2014-05-07 LAB — MAGNESIUM: Magnesium: 2.3 mg/dL (ref 1.5–2.5)

## 2014-05-07 LAB — PRO B NATRIURETIC PEPTIDE: Pro B Natriuretic peptide (BNP): 169.9 pg/mL — ABNORMAL HIGH (ref 0–125)

## 2014-05-07 MED ORDER — ONDANSETRON HCL 4 MG PO TABS: 4.0000 mg | ORAL_TABLET | Freq: Three times a day (TID) | ORAL | Status: DC | PRN

## 2014-05-07 MED ORDER — IBUPROFEN 200 MG PO TABS
600.0000 mg | ORAL_TABLET | Freq: Three times a day (TID) | ORAL | Status: DC | PRN
  Administered 2014-05-07: 600 mg via ORAL
  Filled 2014-05-07: qty 3

## 2014-05-07 MED ORDER — TRAMADOL HCL 50 MG PO TABS
50.0000 mg | ORAL_TABLET | Freq: Four times a day (QID) | ORAL | Status: DC | PRN
  Administered 2014-05-07 – 2014-05-08 (×3): 50 mg via ORAL
  Filled 2014-05-07 (×4): qty 1

## 2014-05-07 MED ORDER — ONDANSETRON HCL 4 MG/2ML IJ SOLN
4.0000 mg | Freq: Once | INTRAMUSCULAR | Status: AC
  Administered 2014-05-07: 4 mg via INTRAVENOUS
  Filled 2014-05-07: qty 2

## 2014-05-07 MED ORDER — SODIUM CHLORIDE 0.9 % IV BOLUS (SEPSIS)
1000.0000 mL | INTRAVENOUS | Status: AC
  Administered 2014-05-07: 1000 mL via INTRAVENOUS

## 2014-05-07 MED ORDER — ALUM & MAG HYDROXIDE-SIMETH 200-200-20 MG/5ML PO SUSP
30.0000 mL | ORAL | Status: DC | PRN
Start: 2014-05-07 — End: 2014-05-08

## 2014-05-07 MED ORDER — ONDANSETRON HCL 4 MG PO TABS
4.0000 mg | ORAL_TABLET | Freq: Three times a day (TID) | ORAL | Status: DC | PRN
  Administered 2014-05-07: 4 mg via ORAL
  Filled 2014-05-07: qty 1

## 2014-05-07 MED ORDER — IBUPROFEN 600 MG PO TABS
600.0000 mg | ORAL_TABLET | Freq: Three times a day (TID) | ORAL | Status: DC | PRN
  Administered 2014-05-07: 600 mg via ORAL
  Filled 2014-05-07: qty 1

## 2014-05-07 MED ORDER — ALUM & MAG HYDROXIDE-SIMETH 200-200-20 MG/5ML PO SUSP
30.0000 mL | ORAL | Status: DC | PRN
  Administered 2014-05-07: 30 mL via ORAL
  Filled 2014-05-07: qty 30

## 2014-05-07 MED ORDER — SODIUM CHLORIDE 0.9 % IV SOLN
1000.0000 mL | INTRAVENOUS | Status: DC
  Administered 2014-05-07: 1000 mL via INTRAVENOUS

## 2014-05-07 MED ORDER — ASPIRIN 81 MG PO CHEW
324.0000 mg | CHEWABLE_TABLET | Freq: Once | ORAL | Status: AC
  Administered 2014-05-07: 324 mg via ORAL
  Filled 2014-05-07: qty 4

## 2014-05-07 MED ORDER — AMITRIPTYLINE HCL 50 MG PO TABS
50.0000 mg | ORAL_TABLET | Freq: Every day | ORAL | Status: DC
  Administered 2014-05-07: 50 mg via ORAL
  Filled 2014-05-07: qty 1
  Filled 2014-05-07: qty 2
  Filled 2014-05-07: qty 1

## 2014-05-07 NOTE — Progress Notes (Signed)
Patient reports that she had been prescribed Prednisone for her vasculitis and had been taking 25 mg bid up until two days ago. She said that she had been on it for two weeks and it had been ordered by Dr. Fredderick Severance. Libby Maw, RN

## 2014-05-07 NOTE — ED Notes (Signed)
Pt states last used cocaine this evening. Pt reports had pain in chest before using

## 2014-05-07 NOTE — ED Notes (Signed)
1 pt belonging back in locker #[id number]

## 2014-05-07 NOTE — Consult Note (Signed)
[facility] Consult   Reason for Consult:  Depression with suicidal ideations Referring Physician:  ED MD [name] is an 53 y.o. female. Total Time spent with patient: 20 minutes  Assessment: AXIS I:  Major Depression, Recurrent severe, Substance Abuse and Substance Induced Mood Disorder AXIS II:  Deferred AXIS III:   Past Medical History  Diagnosis Date  . 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 Followed by Dr. Louanne Skye  . Hypertension   . Cocaine abuse     ongoing with resultant vaculitis.  . Depression   . Normocytic anemia     BL Hgb 9.8-12. Last anemia panel 04/2010 - showing Fe 19, ferritin 101.  Pt on monthly B12 injections  . 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 CAP (community acquired pneumonia) 03/2014 X 2  . Daily headache   . Migraines     "probably 5-6/yr" (04/21/2014)  . Rheumatoid arthritis(714.0)     patient reported  . Inflammatory arthritis    AXIS IV:  economic problems, housing problems, other psychosocial or environmental problems, problems related to social environment, problems with access to health care services and problems with primary support group AXIS V:  41-50 serious symptoms    Subjective:   [name] is a 53 y.o. female patient admitted with suicidal ideation and substance abuse. Pt hs a longstanding mental health history and is well known to this NP and Dr. Dwyane Dee who assesses her in ED today. Pt is crying throughout the assessment and stating SI with intent secondary to depression, feelings of isolation, and substance abuse.   HPI:  [name] is an 53 y.o. female presenting to [facility] ED with suicidal ideations. Pt stated "I am depressed and I feel like killing myself". Pt is currently endorsing SI but denies having a plan. Pt did not report any previous suicidal attempts but reported that she has been hospitalized in the past. Pt denies HI and AVH at this time but reported that she has  experienced auditory hallucinations in the past. Pt reported that she is currently receiving mental health services through [facility]. Pt denied having access to weapons and did not report any pending criminal charges or upcoming court dates. Pt is endorsing some depressive symptoms and shared that she is dealing with multiple stressors such as her housing situation and being estranged from her family. Pt shared that she has not seen her family in approximately 71 years. Pt reported that she has smoked crack/cocaine since she was 53 years old and was able to remain clean for 3 months. Pt reported that she recently relapsed after thinking about her family. Pt did not report any other illicit substance or alcohol use. Pt reported that she has been staying at a homeless shelter and her time is almost up there. Pt did not report any physical, sexual or verbal abuse but became tearful when she was asked about it. Pt shared that she has a good support system with her friends. Pt is alert and oriented x3. Pt is calm and cooperative during this assessment. Pt mood is depressed and her affect is congruent with her mood. Pt thought process is coherent and relevant. Pt speech is logical and coherent.     HPI Elements:   Location:  generalized. Quality:  acute. Severity:  severe. Timing:  constant. Duration:  couple of weeks. Context:  stressors.  Past Psychiatric History: Past Medical History  Diagnosis Date  .  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 Followed by Dr. Louanne Skye  . Hypertension   . Cocaine abuse     ongoing with resultant vaculitis.  . Depression   . Normocytic anemia     BL Hgb 9.8-12. Last anemia panel 04/2010 - showing Fe 19, ferritin 101.  Pt on monthly B12 injections  . 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 CAP (community acquired pneumonia) 03/2014 X 2  . Daily headache   . Migraines     "probably 5-6/yr" (04/21/2014)  . Rheumatoid arthritis(714.0)      patient reported  . Inflammatory arthritis     reports that she has been smoking Cigarettes.  She has a 10.2 pack-year smoking history. She has never used smokeless tobacco. She reports that she uses illicit drugs ("Crack" cocaine, Cocaine, and Marijuana). She reports that she does not drink alcohol. Family History  Problem Relation Age of Onset  . Breast cancer Mother     Breast cancer  . Alcohol abuse Mother   . Colon cancer Maternal Aunt 110  . Alcohol abuse Father    Family History Substance Abuse: Yes, Describe: (Parents- alcoholics) Family Supports: No Living Arrangements: Other (Comment) (Homeless Shelter ) Can pt return to current living arrangement?: Yes Abuse/Neglect [facility]) Physical Abuse: Denies Verbal Abuse: Denies Sexual Abuse: Denies Allergies:   Allergies  Allergen Reactions  . Acetaminophen Swelling    REACTION: eyelid swelling    ACT Assessment Complete:  Yes:    Educational Status    Risk to Self: Risk to self with the past 6 months Suicidal Ideation: Yes-Currently Present Suicidal Intent: No Is patient at risk for suicide?: No Suicidal Plan?: No Access to Means: No What has been your use of drugs/alcohol within the last 12 months?: Crack/cocaine use  Previous Attempts/Gestures: No How many times?: 0 Other Self Harm Risks: No other self harm risk identified at this time.  Triggers for Past Attempts: None known Intentional Self Injurious Behavior: None Family Suicide History: No Recent stressful life event(s): Other (Comment) (No contact with family. Housing situation. ) Persecutory voices/beliefs?: No Depression: Yes Depression Symptoms: Insomnia;Tearfulness;Fatigue;Guilt;Feeling angry/irritable Substance abuse history and/or treatment for substance abuse?: Yes Suicide prevention information given to non-admitted patients: Not applicable  Risk to Others: Risk to Others within the past 6 months Homicidal Ideation: No Thoughts of Harm to Others:  No Current Homicidal Intent: No Current Homicidal Plan: No Access to Homicidal Means: No Identified Victim: NA History of harm to others?: No Assessment of Violence: None Noted Violent Behavior Description: No violent behavior reported. Pt is calm and cooperative.  Does patient have access to weapons?: No Criminal Charges Pending?: No Does patient have a court date: No  Abuse: Abuse/Neglect Assessment (Assessment to be complete while patient is alone) Physical Abuse: Denies Verbal Abuse: Denies Sexual Abuse: Denies Exploitation of patient/patient's resources: Denies Self-Neglect: Denies  Prior Inpatient Therapy: Prior Inpatient Therapy Prior Inpatient Therapy: Yes Prior Therapy Dates: 2014; 2015 Prior Therapy Facilty/Provider(s): [facility]; [place]  Reason for Treatment: Depression/SA  Prior Outpatient Therapy: Prior Outpatient Therapy Prior Outpatient Therapy: Yes Prior Therapy Dates: 2015 Prior Therapy Facilty/Provider(s): [facility]  Reason for Treatment: Depression   Additional Information: Additional Information 1:1 In Past 12 Months?: No CIRT Risk: No Elopement Risk: No                  Objective: Blood pressure 118/76, pulse 87, temperature 98.9 F (37.2 C), temperature source Oral,  resp. rate 16, height 5' 1.5" (1.562 m), weight 55.792 kg (123 lb), SpO2 100.00%.Body mass index is 22.87 kg/(m^2). Results for orders placed during the hospital encounter of 05/07/14 (from the past 72 hour(s))  CBC     Status: Abnormal   Collection Time    05/07/14  1:57 AM      Result Value Ref Range   WBC 8.9  4.0 - 10.5 K/uL   RBC 4.80  3.87 - 5.11 MIL/uL   Hemoglobin 14.1  12.0 - 15.0 g/dL   HCT 41.0  36.0 - 46.0 %   MCV 85.4  78.0 - 100.0 fL   MCH 29.4  26.0 - 34.0 pg   MCHC 34.4  30.0 - 36.0 g/dL   RDW 16.0 (*) 11.5 - 15.5 %   Platelets 251  150 - 400 K/uL  COMPREHENSIVE METABOLIC PANEL     Status: Abnormal   Collection Time    05/07/14  1:57 AM      Result Value  Ref Range   Sodium 134 (*) 137 - 147 mEq/L   Potassium 4.6  3.7 - 5.3 mEq/L   Chloride 98  96 - 112 mEq/L   CO2 20  19 - 32 mEq/L   Glucose, Bld 112 (*) 70 - 99 mg/dL   BUN 33 (*) 6 - 23 mg/dL   Creatinine, Ser 1.42 (*) 0.50 - 1.10 mg/dL   Calcium 9.8  8.4 - 10.5 mg/dL   Total Protein 7.7  6.0 - 8.3 g/dL   Albumin 3.9  3.5 - 5.2 g/dL   AST 14  0 - 37 U/L   ALT 17  0 - 35 U/L   Alkaline Phosphatase 90  39 - 117 U/L   Total Bilirubin 0.5  0.3 - 1.2 mg/dL   GFR calc non Af Amer 42 (*) >90 mL/min   GFR calc Af Amer 49 (*) >90 mL/min   Comment: (NOTE)     The eGFR has been calculated using the CKD EPI equation.     This calculation has not been validated in all clinical situations.     eGFR's persistently <90 mL/min signify possible Chronic Kidney     Disease.   Anion gap 16 (*) 5 - 15  PRO B NATRIURETIC PEPTIDE     Status: Abnormal   Collection Time    05/07/14  1:57 AM      Result Value Ref Range   Pro B Natriuretic peptide (BNP) 169.9 (*) 0 - 125 pg/mL  MAGNESIUM     Status: None   Collection Time    05/07/14  1:57 AM      Result Value Ref Range   Magnesium 2.3  1.5 - 2.5 mg/dL  PROTIME-INR     Status: None   Collection Time    05/07/14  1:57 AM      Result Value Ref Range   Prothrombin Time 13.4  11.6 - 15.2 seconds   INR 1.02  0.00 - 1.49  TROPONIN I     Status: None   Collection Time    05/07/14  1:57 AM      Result Value Ref Range   Troponin I <0.30  <0.30 ng/mL   Comment:            Due to the release kinetics of cTnI,     a negative result within the first hours     of the onset of symptoms does not rule out     myocardial infarction  with certainty.     If myocardial infarction is still suspected,     repeat the test at appropriate intervals.  URINE RAPID DRUG SCREEN (HOSP PERFORMED)     Status: Abnormal   Collection Time    05/07/14  9:58 AM      Result Value Ref Range   Opiates NONE DETECTED  NONE DETECTED   Cocaine POSITIVE (*) NONE DETECTED    Benzodiazepines NONE DETECTED  NONE DETECTED   Amphetamines NONE DETECTED  NONE DETECTED   Tetrahydrocannabinol POSITIVE (*) NONE DETECTED   Barbiturates NONE DETECTED  NONE DETECTED   Comment:            DRUG SCREEN FOR MEDICAL PURPOSES     ONLY.  IF CONFIRMATION IS NEEDED     FOR ANY PURPOSE, NOTIFY LAB     WITHIN 5 DAYS.                LOWEST DETECTABLE LIMITS     FOR URINE DRUG SCREEN     Drug Class       Cutoff (ng/mL)     Amphetamine      1000     Barbiturate      200     Benzodiazepine   161     Tricyclics       096     Opiates          300     Cocaine          300     THC              50  URINALYSIS, ROUTINE W REFLEX MICROSCOPIC     Status: Abnormal   Collection Time    05/07/14  9:58 AM      Result Value Ref Range   Color, Urine YELLOW  YELLOW   APPearance CLOUDY (*) CLEAR   Specific Gravity, Urine 1.026  1.005 - 1.030   pH 5.5  5.0 - 8.0   Glucose, UA NEGATIVE  NEGATIVE mg/dL   Hgb urine dipstick TRACE (*) NEGATIVE   Bilirubin Urine NEGATIVE  NEGATIVE   Ketones, ur NEGATIVE  NEGATIVE mg/dL   Protein, ur NEGATIVE  NEGATIVE mg/dL   Urobilinogen, UA 0.2  0.0 - 1.0 mg/dL   Nitrite NEGATIVE  NEGATIVE   Leukocytes, UA SMALL (*) NEGATIVE  URINE MICROSCOPIC-ADD ON     Status: Abnormal   Collection Time    05/07/14  9:58 AM      Result Value Ref Range   Squamous Epithelial / LPF FEW (*) RARE   WBC, UA 0-2  <3 WBC/hpf   RBC / HPF 3-6  <3 RBC/hpf   Casts HYALINE CASTS (*) NEGATIVE   Labs are reviewed and UDS + cocaine, + THC.   Current Facility-Administered Medications  Medication Dose Route Frequency Provider Last Rate Last Dose  . 0.9 %  sodium chloride infusion  1,000 mL Intravenous Continuous Carmin Muskrat, MD   1,000 mL at 05/07/14 0216  . alum & mag hydroxide-simeth (MAALOX/MYLANTA) 200-200-20 MG/5ML suspension 30 mL  30 mL Oral PRN Neta Ehlers, MD   30 mL at 05/07/14 0844  . ibuprofen (ADVIL,MOTRIN) tablet 600 mg  600 mg Oral Q8H PRN Neta Ehlers,  MD   600 mg at 05/07/14 0844  . ondansetron (ZOFRAN) tablet 4 mg  4 mg Oral Q8H PRN Neta Ehlers, MD   4 mg at 05/07/14 0454   Current Outpatient Prescriptions  Medication Sig  Dispense Refill  . buPROPion (WELLBUTRIN) 100 MG tablet Take 1 tablet (100 mg total) by mouth daily before breakfast. For depression  30 tablet  0  . hydrOXYzine (VISTARIL) 25 MG capsule Take 25 mg by mouth at bedtime.      Marland Kitchen ibuprofen (ADVIL,MOTRIN) 800 MG tablet Take 800 mg by mouth every 8 (eight) hours as needed (pain).      Marland Kitchen lisinopril (PRINIVIL,ZESTRIL) 20 MG tablet Take 1 tablet (20 mg total) by mouth daily. For high blood pressure  30 tablet  5  . oxyCODONE (OXY IR/ROXICODONE) 5 MG immediate release tablet Take 1 tablet (5 mg total) by mouth every 4 (four) hours as needed for moderate pain.  30 tablet  0  . pantoprazole (PROTONIX) 40 MG tablet Take 1 tablet (40 mg total) by mouth 2 (two) times daily.  60 tablet  0  . predniSONE (DELTASONE) 20 MG tablet Take 20 mg by mouth 2 (two) times daily with a meal.      . silver sulfADIAZINE (SILVADENE) 1 % cream Apply 1 application topically 2 (two) times daily.      Marland Kitchen sulfamethoxazole-trimethoprim (BACTRIM DS) 800-160 MG per tablet Take 1 tablet by mouth daily.  30 tablet  0  . traZODone (DESYREL) 50 MG tablet Take 1 tablet (50 mg total) by mouth at bedtime. For sleep  30 tablet  0    Psychiatric Specialty Exam:     Blood pressure 118/76, pulse 87, temperature 98.9 F (37.2 C), temperature source Oral, resp. rate 16, height 5' 1.5" (1.562 m), weight 55.792 kg (123 lb), SpO2 100.00%.Body mass index is 22.87 kg/(m^2).  General Appearance: Disheveled  Eye Contact::  Poor  Speech:  Slurred  Volume:  Normal  Mood:  Depressed and Hopeless  Affect:  Congruent, tearful  Thought Process:  Coherent  Orientation:  Full (Time, Place, and Person)  Thought Content:  Rumination  Suicidal Thoughts:  Yes.  without intent/plan  Homicidal Thoughts:  No  Memory:  Immediate;    Fair Recent;   Fair Remote;   Fair  Judgement:  Poor  Insight:  Fair  Psychomotor Activity:  Decreased  Concentration:  Fair  Recall:  [employer] of Knowledge:Fair  Language: Good  Akathisia:  No  Handed:  Right  AIMS (if indicated):     Assets:  Resilience  Sleep:      Musculoskeletal: Strength & Muscle Tone: within normal limits Gait & Station: normal Patient leans: N/A  Treatment Plan Summary[facility], restart medications from last discharge.  Benjamine Mola, FNP-BC 05/07/2014 1:01 PM

## 2014-05-07 NOTE — ED Notes (Signed)
Pt belongings have been collected in a Pt Belonging bad and are as follows: Pants Shirt Shoes Phone Wallet Lighter Watch  Marker   Bag is currently at 1-8 Nurses station, labeled with Pt ID stickers.

## 2014-05-07 NOTE — ED Provider Notes (Signed)
CSN: [id number]     Arrival date & time 05/07/14  0012 History   First MD Initiated Contact with Patient 05/07/14 0018     Chief Complaint  Patient presents with  . Chest Pain  . Cough  . Suicidal      HPI  Patient resents with chest pain. Patient notes that the onset was earlier today to me, but told the nurses something else. Patient smokes crack cocaine, last smoked earlier today. Chest pain is left-sided, nonradiating, nonexertional, nonpleuritic. No relief with anything. Patient also complains of worsening depression, addiction to cocaine.  Patient was cleaned until approximately 2 months ago. Patient also endorses suicidal thoughts.   Past Medical History  Diagnosis Date  . 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 Followed by Dr. Louanne Skye  . Hypertension   . Cocaine abuse     ongoing with resultant vaculitis.  . Depression   . Normocytic anemia     BL Hgb 9.8-12. Last anemia panel 04/2010 - showing Fe 19, ferritin 101.  Pt on monthly B12 injections  . 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 CAP (community acquired pneumonia) 03/2014 X 2  . Daily headache   . Migraines     "probably 5-6/yr" (04/21/2014)  . Rheumatoid arthritis(714.0)     patient reported  . Inflammatory arthritis    Past Surgical History  Procedure Laterality Date  . Skin biopsy Bilateral 04/2010    shin nodules  . Irrigation and debridement abscess Bilateral 09/26/2013    Procedure: DEBRIDEMENT ULCERS BILATERAL THIGHS;  Surgeon: Gwenyth Ober, MD;  Location: [place];  Service: General;  Laterality: Bilateral;  . Hernia repair      "stomach"   Family History  Problem Relation Age of Onset  . Breast cancer Mother     Breast cancer  . Alcohol abuse Mother   . Colon cancer Maternal Aunt 63  . Alcohol abuse Father    History  Substance Use Topics  . Smoking status: Current Every Day Smoker -- 0.30 packs/day for 34 years    Types: Cigarettes  . Smokeless tobacco:  Never Used     Comment: PATIENT HAS CUT BACK TO 5 CIGARETES A DAY  . Alcohol Use: No   OB History   Grav Para Term Preterm Abortions TAB SAB Ect Mult Living                 Review of Systems  Constitutional:       Per HPI, otherwise negative  HENT:       Per HPI, otherwise negative  Respiratory:       Per HPI, otherwise negative  Cardiovascular:       Per HPI, otherwise negative  Gastrointestinal: Negative for vomiting.  Endocrine:       Negative aside from HPI  Genitourinary:       Neg aside from HPI   Musculoskeletal:       Per HPI, otherwise negative  Skin: Positive for wound.  Neurological: Negative for syncope.  Psychiatric/Behavioral: Positive for suicidal ideas. The patient is nervous/anxious.       Allergies  Acetaminophen  Home Medications   Prior to Admission medications   Medication Sig Start Date End Date Taking? Authorizing Provider  buPROPion (WELLBUTRIN) 100 MG tablet Take 1 tablet (100 mg total) by mouth daily before breakfast. For depression 01/24/14  Yes Encarnacion Slates, NP  hydrOXYzine (VISTARIL) 25 MG capsule Take 25  mg by mouth at bedtime.   Yes Historical Provider, MD  ibuprofen (ADVIL,MOTRIN) 800 MG tablet Take 800 mg by mouth every 8 (eight) hours as needed (pain).   Yes Historical Provider, MD  lisinopril (PRINIVIL,ZESTRIL) 20 MG tablet Take 1 tablet (20 mg total) by mouth daily. For high blood pressure 04/30/14  Yes Arman Filter, MD  oxyCODONE (OXY IR/ROXICODONE) 5 MG immediate release tablet Take 1 tablet (5 mg total) by mouth every 4 (four) hours as needed for moderate pain. 04/30/14  Yes Arman Filter, MD  pantoprazole (PROTONIX) 40 MG tablet Take 1 tablet (40 mg total) by mouth 2 (two) times daily. 04/22/14  Yes Clinton Gallant, MD  predniSONE (DELTASONE) 20 MG tablet Take 20 mg by mouth 2 (two) times daily with a meal. 04/23/14  Yes Clinton Gallant, MD  silver sulfADIAZINE (SILVADENE) 1 % cream Apply 1 application topically 2 (two) times daily.   Yes  Historical Provider, MD  sulfamethoxazole-trimethoprim (BACTRIM DS) 800-160 MG per tablet Take 1 tablet by mouth daily. 04/22/14  Yes Clinton Gallant, MD  traZODone (DESYREL) 50 MG tablet Take 1 tablet (50 mg total) by mouth at bedtime. For sleep 04/30/14  Yes Arman Filter, MD   BP 142/91  Pulse 110  Temp(Src) 98.8 F (37.1 C) (Oral)  Resp 18  Ht 5' 1.5" (1.562 m)  Wt 123 lb (55.792 kg)  BMI 22.87 kg/m2  SpO2 100% Physical Exam  Nursing note and vitals reviewed. Constitutional: She is oriented to person, place, and time. She appears well-developed and well-nourished. No distress.  HENT:  Head: Normocephalic and atraumatic.  Eyes: Conjunctivae and EOM are normal.  Cardiovascular: Normal rate and regular rhythm.   Pulmonary/Chest: Effort normal and breath sounds normal. No stridor. No respiratory distress.  Abdominal: She exhibits no distension.  Musculoskeletal: She exhibits no edema.  Neurological: She is alert and oriented to person, place, and time. No cranial nerve deficit.  Skin: Skin is warm and dry.     Psychiatric: Her mood appears anxious. She expresses suicidal ideation. She expresses no suicidal plans.    ED Course  Procedures (including critical care time) Labs Review Labs Reviewed  CBC - Abnormal; Notable for the following:    RDW 16.0 (*)    All other components within normal limits  COMPREHENSIVE METABOLIC PANEL - Abnormal; Notable for the following:    Sodium 134 (*)    Glucose, Bld 112 (*)    BUN 33 (*)    Creatinine, Ser 1.42 (*)    GFR calc non Af Amer 42 (*)    GFR calc Af Amer 49 (*)    Anion gap 16 (*)    All other components within normal limits  PRO B NATRIURETIC PEPTIDE - Abnormal; Notable for the following:    Pro B Natriuretic peptide (BNP) 169.9 (*)    All other components within normal limits  MAGNESIUM  PROTIME-INR  TROPONIN I    Imaging Review Dg Chest 2 View  05/07/2014   CLINICAL DATA:  Chest pain  EXAM: CHEST  2 VIEW  COMPARISON:   04/16/2014  FINDINGS: Right upper lobe scarring again noted and unchanged. Remaining lungs are clear. Negative for heart failure or pneumonia. No pleural effusion.  IMPRESSION: No active cardiopulmonary disease.   Electronically Signed   By: Franchot Gallo M.D.   On: 05/07/2014 01:37     EKG Interpretation   Date/Time:  Monday May 07 2014 00:28:31 EDT Ventricular Rate:  104 PR Interval:  112 QRS Duration:  75 QT Interval:  404 QTC Calculation: 531 R Axis:   82 Text Interpretation:  Sinus tachycardia Left ventricular hypertrophy p  wave abnormalities No significant change since last tracing Abnormal ekg  Confirmed by Carmin Muskrat  MD [phone]) on 05/07/2014 1:15:13 AM     Patient's labs are notable for doubling of creatinine in 2 weeks. Patient received 1 L IV fluids, will continue to receive another. No other evidence for renal failure. Patient has been seen and evaluated by behavioral health.  I recommend inpatient admission for her depression/suicidal ideation, substance abuse  MDM   Patient history polysubstance abuse presents with chest pain.  After initial evaluation the patient also endorses suicidal thoughts.  Patient's evaluation is largely reassuring, though there is some evidence for dehydration with her tachycardia, elevated creatinine.  Over, the patient received several liters of IV fluids, has decreased tachycardia, chest pain resolved, and she has no elements of distress. Patient was medically cleared for further evaluation by our psychiatry team.    Carmin Muskrat, MD 05/07/14 [phone]

## 2014-05-07 NOTE — Progress Notes (Signed)
Patient skin search revealed open, reddened area on top of buttocks, midline, the size of a dime. Multiple irritated healing areas of various degree in the same area. Left fourth toe edematous with abraded area on  small left toe. Patient has necrotic areas on bilateral ears and tip of nose.

## 2014-05-07 NOTE — Progress Notes (Addendum)
[name], female   DOB: July 11, 1961, 53 y.o.   MRN: [id number] female transferred to OBS unit from [facility] where she initiailly presented with chest pain and substance abuse and later intimated SI. She is calm and tearful during assessment. She appears flat and dishevled. Open and spontaneous in conversation. Continues to endorse Passive SI but contracts for safety. Currently denies AH, but states she did has AH as recently as 4 days ago. Denies VH and HI. She was medically cleared at [facility]. 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 Current smoker, but declines cessation aides. She admission medical assessment for further details. States she has been medication compliant. Education provided. Belongings searched by dawn P. RN and no contracband discovered. Denies pain/discomfort. POC, unit policies and expectations reviewed and understanding verbalized. She offered no questions or concerns. Q15 minute checks initiated on arrival. Will continue q15 minute checks and current POC.

## 2014-05-07 NOTE — ED Notes (Signed)
Psychiatrist at bedside

## 2014-05-07 NOTE — Progress Notes (Addendum)
CSW called and spoke with Marcello Moores with [facility] to inform him that the patient can be referred to [facility] for shelter.  CSW provided Marcello Moores with the online website for the application to be completed and faxed to the facility at [phone].     Chesley Noon, MSW, [place], 05/07/2014 Evening Clinical Social Worker [phone]

## 2014-05-07 NOTE — BH Assessment (Signed)
Per Dr. Dwyane Dee and Heloise Purpura W.,NP pt is appropriate for admit to observation unit. Pt admitted to observation bed and support paperwork is complete.   Shaune Pollack, MS, [place] Assessment Counselor

## 2014-05-07 NOTE — ED Notes (Signed)
Per [facility] Pt c/o of left sided CP without SOB- since Friday. Associated with cough. Greater with inspiration, movement, palpation. Lung sound clear. Recent HX of pneumonia. Productive cough Light green. Denies fever

## 2014-05-07 NOTE — ED Notes (Signed)
MD at bedside. PRESENT TO EVALUATE PT. PT STATES SHE WANTS HELP WITH COCAINE ABUSE

## 2014-05-07 NOTE — ED Notes (Signed)
Made patient and sitter aware that we need urine sample.

## 2014-05-07 NOTE — Progress Notes (Signed)
CSW completed care coordination referral with Bjorn Pippin of [facility]. He reports that someone will follow up in the next couple days.    Chesley Noon, MSW, [place], 05/07/2014 Evening Clinical Social Worker [phone]

## 2014-05-07 NOTE — Progress Notes (Signed)
Patient complaining of pain 10/10 in ear. The patient has necrotic areas of tissue around the edge of her ears and an area the size of a quarter on the tip of her nose. She is tearful, and still rating the pain 10/10 one hour after having 600 mg of ibuprofen per prn order. She says that the pain in her ear is what is making her feel suicidal, but voices no plan and can contract for safety. PA made aware. Will continue to monitor. Libby Maw, RN

## 2014-05-07 NOTE — ED Notes (Signed)
Bed: IL57 Expected date:  Expected time:  Means of arrival:  Comments: EMS CP

## 2014-05-07 NOTE — ED Notes (Signed)
Pt belongings given to staff in secured area

## 2014-05-07 NOTE — BH Assessment (Addendum)
Assessment Note  [name] is an 53 y.o. female presenting to [facility] ED with suicidal ideations. Pt stated "I am depressed and I feel like killing myself". Pt is currently endorsing SI but denies having a plan. Pt did not report any previous suicidal attempts but reported that she has been hospitalized in the past. Pt denies HI and AVH at this time but reported that she has experienced auditory hallucinations in the past. Pt reported that she is currently receiving mental health services through [place]. Pt denied having access to weapons and did not report any pending criminal charges or upcoming court dates. Pt is endorsing some depressive symptoms and shared that she is dealing with multiple stressors such as her housing situation and being estranged from her family. Pt shared that she has not seen her family in approximately 13 years. Pt reported that she has smoked crack/cocaine since she was 53 years old and was able to remain clean for 3 months. Pt reported that she recently relapsed after thinking about her family. Pt did not report any other illicit substance or alcohol use. Pt reported that she has been staying at a homeless shelter and her time is almost up there. Pt did not report any physical, sexual or verbal abuse but became tearful when she was asked about it. Pt shared that she has a good support system with her friends. Pt is alert and oriented x3. Pt is calm and cooperative during this assessment. Pt mood is depressed and her affect is congruent with her mood. Pt thought process is coherent and relevant. Pt speech is logical and coherent.   Axis I: Major Depression, Recurrent severe and Cocaine Use Disorder, Moderate  Axis II: Deferred Axis III:  Past Medical History  Diagnosis Date  . 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 Followed by Dr. Louanne Skye  . Hypertension   . Cocaine abuse     ongoing with resultant vaculitis.  . Depression   . Normocytic anemia     BL Hgb 9.8-12. Last  anemia panel 04/2010 - showing Fe 19, ferritin 101.  Pt on monthly B12 injections  . 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 CAP (community acquired pneumonia) 03/2014 X 2  . Daily headache   . Migraines     "probably 5-6/yr" (04/21/2014)  . Rheumatoid arthritis(714.0)     patient reported  . Inflammatory arthritis    Axis IV: economic problems, housing problems and problems with primary support group Axis V: 41-50 serious symptoms  Past Medical History:  Past Medical History  Diagnosis Date  . 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 Followed by Dr. Louanne Skye  . Hypertension   . Cocaine abuse     ongoing with resultant vaculitis.  . Depression   . Normocytic anemia     BL Hgb 9.8-12. Last anemia panel 04/2010 - showing Fe 19, ferritin 101.  Pt on monthly B12 injections  . 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 CAP (community acquired pneumonia) 03/2014 X 2  . Daily headache   . Migraines     "probably 5-6/yr" (04/21/2014)  . Rheumatoid arthritis(714.0)     patient reported  . Inflammatory arthritis     Past Surgical History  Procedure Laterality Date  . Skin biopsy Bilateral 04/2010    shin nodules  . Irrigation and debridement abscess Bilateral 09/26/2013    Procedure: DEBRIDEMENT  ULCERS BILATERAL THIGHS;  Surgeon: Gwenyth Ober, MD;  Location: [place];  Service: General;  Laterality: Bilateral;  . Hernia repair      "stomach"    Family History:  Family History  Problem Relation Age of Onset  . Breast cancer Mother     Breast cancer  . Alcohol abuse Mother   . Colon cancer Maternal Aunt 52  . Alcohol abuse Father     Social History:  reports that she has been smoking Cigarettes.  She has a 10.2 pack-year smoking history. She has never used smokeless tobacco. She reports that she uses illicit drugs ("Crack" cocaine, Cocaine, and Marijuana). She reports that she does not drink alcohol.  Additional Social  History:  Alcohol / Drug Use History of alcohol / drug use?: Yes Longest period of sobriety (when/how long): 3 mos.  Negative Consequences of Use: Personal relationships Substance #1 Name of Substance 1: Crack/Cocaine  1 - Age of First Use: 17 1 - Amount (size/oz): "1 rock" 1 - Frequency: daily  1 - Duration: 1 day  1 - Last Use / Amount: 05-06-14 "1 rock"   CIWA: CIWA-Ar BP: 142/91 mmHg Pulse Rate: 110 COWS:    Allergies:  Allergies  Allergen Reactions  . Acetaminophen Swelling    REACTION: eyelid swelling    Home Medications:  (Not in a hospital admission)  OB/GYN Status:  No LMP recorded. Patient is postmenopausal.  General Assessment Data Location of Assessment: [facility] Is this a Tele or Face-to-Face Assessment?: Face-to-Face Is this an Initial Assessment or a Re-assessment for this encounter?: Initial Assessment Living Arrangements: Other (Comment) (Homeless Shelter ) Can pt return to current living arrangement?: Yes Admission Status: Voluntary Is patient capable of signing voluntary admission?: Yes Transfer from: Home Referral Source: Self/Family/Friend     [place] Living Arrangements: Other (Comment) (Homeless Shelter ) Name of Psychiatrist: Warden/ranger Name of Therapist: Warden/ranger  Education Status Is patient currently in school?: No Current Grade: NA Highest grade of school patient has completed: 11 Name of school: NA Contact person: NA  Risk to self with the past 6 months Suicidal Ideation: Yes-Currently Present Suicidal Intent: No Is patient at risk for suicide?: No Suicidal Plan?: No Access to Means: No What has been your use of drugs/alcohol within the last 12 months?: Crack/cocaine use  Previous Attempts/Gestures: No How many times?: 0 Other Self Harm Risks: No other self harm risk identified at this time.  Triggers for Past Attempts: None known Intentional Self Injurious Behavior: None Family Suicide History: No Recent stressful  life event(s): Other (Comment) (No contact with family. Housing situation. ) Persecutory voices/beliefs?: No Depression: Yes Depression Symptoms: Insomnia;Tearfulness;Fatigue;Guilt;Feeling angry/irritable Substance abuse history and/or treatment for substance abuse?: Yes Suicide prevention information given to non-admitted patients: Not applicable  Risk to Others within the past 6 months Homicidal Ideation: No Thoughts of Harm to Others: No Current Homicidal Intent: No Current Homicidal Plan: No Access to Homicidal Means: No Identified Victim: NA History of harm to others?: No Assessment of Violence: None Noted Violent Behavior Description: No violent behavior reported. Pt is calm and cooperative.  Does patient have access to weapons?: No Criminal Charges Pending?: No Does patient have a court date: No  Psychosis Hallucinations: None noted Delusions: None noted  Mental Status Report Appear/Hygiene: In hospital gown Eye Contact: Fair Motor Activity: Freedom of movement Speech: Logical/coherent Level of Consciousness: Quiet/awake Mood: Depressed Affect: Depressed Anxiety Level: Minimal Thought Processes: Coherent;Relevant Judgement: Unimpaired Orientation: Person;Place;Time;Situation Obsessive Compulsive  Thoughts/Behaviors: None  Cognitive Functioning Concentration: Fair Memory: Recent Intact;Remote Intact IQ: Average Insight: Fair Impulse Control: Good Appetite: Good Weight Loss: 0 Weight Gain: 0 Sleep: No Change Total Hours of Sleep: 8 Vegetative Symptoms: Staying in bed  ADLScreening [facility] Assessment Services) Patient's cognitive ability adequate to safely complete daily activities?: Yes Patient able to express need for assistance with ADLs?: Yes Independently performs ADLs?: Yes (appropriate for developmental age)  Prior Inpatient Therapy Prior Inpatient Therapy: Yes Prior Therapy Dates: 2014; 2015 Prior Therapy Facilty/Provider(s): [facility]; [place]   Reason for Treatment: Depression/SA  Prior Outpatient Therapy Prior Outpatient Therapy: Yes Prior Therapy Dates: 2015 Prior Therapy Facilty/Provider(s): [facility]  Reason for Treatment: Depression   ADL Screening (condition at time of admission) Patient's cognitive ability adequate to safely complete daily activities?: Yes Is the patient deaf or have difficulty hearing?: No Does the patient have difficulty seeing, even when wearing glasses/contacts?: No Does the patient have difficulty concentrating, remembering, or making decisions?: No Patient able to express need for assistance with ADLs?: Yes Does the patient have difficulty dressing or bathing?: No Independently performs ADLs?: Yes (appropriate for developmental age)       Abuse/Neglect Assessment (Assessment to be complete while patient is alone) Physical Abuse: Denies Verbal Abuse: Denies Sexual Abuse: Denies Exploitation of patient/patient's resources: Denies Self-Neglect: Denies Values / Beliefs Cultural Requests During Hospitalization: None Spiritual Requests During Hospitalization: None        Additional Information 1:1 In Past 12 Months?: No CIRT Risk: No Elopement Risk: No     Disposition:  Disposition Initial Assessment Completed for this Encounter: Yes Disposition of Patient: Other dispositions Other disposition(s): Other (Comment) (Psychiatric evaluation in the morning. )  On Site Evaluation by:   Reviewed with Physician:    Christeena Krogh S 05/07/2014 4:05 AM

## 2014-05-07 NOTE — ED Notes (Signed)
Pt alert and oriented x4. Respirations even and unlabored, bilateral symmetrical rise and fall of chest. Skin warm and dry. In no acute distress. Denies needs.   

## 2014-05-08 DIAGNOSIS — F332 Major depressive disorder, recurrent severe without psychotic features: Secondary | ICD-10-CM | POA: Diagnosis not present

## 2014-05-08 DIAGNOSIS — F1994 Other psychoactive substance use, unspecified with psychoactive substance-induced mood disorder: Secondary | ICD-10-CM

## 2014-05-08 DIAGNOSIS — F1910 Other psychoactive substance abuse, uncomplicated: Secondary | ICD-10-CM

## 2014-05-08 MED ORDER — PANTOPRAZOLE SODIUM 40 MG PO TBEC: 40.0000 mg | DELAYED_RELEASE_TABLET | Freq: Two times a day (BID) | ORAL | Status: DC

## 2014-05-08 MED ORDER — SILVER SULFADIAZINE 1 % EX CREA: 1.0000 "application " | TOPICAL_CREAM | Freq: Two times a day (BID) | CUTANEOUS | Status: DC

## 2014-05-08 MED ORDER — TRAZODONE HCL 50 MG PO TABS: 50.0000 mg | ORAL_TABLET | Freq: Every day | ORAL | Status: DC

## 2014-05-08 MED ORDER — PREDNISONE 20 MG PO TABS: 20.0000 mg | ORAL_TABLET | Freq: Two times a day (BID) | ORAL | Status: DC

## 2014-05-08 MED ORDER — BUPROPION HCL 100 MG PO TABS: 100.0000 mg | ORAL_TABLET | Freq: Every day | ORAL | Status: DC

## 2014-05-08 NOTE — H&P (Signed)
Patient admitted for monitoring. Patient's urine toxicology screen was positive for cocaine. Patient also currently is homeless.. Patient was seen and evaluated by me

## 2014-05-08 NOTE — Progress Notes (Signed)
Orders for Discharge received. Pt will follow up with [facility] and is to report to [place] between 6-8 PM. AVS provided and understanding verbalized. 2 bus passes and PART fare were provided. She denied SI/HI/AVH. She stated her pain was tolerable at a 3. She offered no additional questions or concerns. Belongings returned and patient was escorted to the lobby. She left in no acute distress.

## 2014-05-08 NOTE — Discharge Instructions (Signed)
For your current housing needs, you are advised to go to [facility] in [place], where they currently have cots available.  Plan to be there for intake between 6:00 pm and 8:00 pm:       [facility]      [address]      [place], [place] [address]      [phone]  For your ongoing behavioral health needs, you will soon be receiving a call from [facility].  Their [facility] ACT Team may be able to provide a broader range of services for you than your current provider:       [place]. ([place])      [phone]  Until [facility] contacts you, continue with your regular outpatient treatment at [facility]:       [facility]      [address] [place], [place] [address]      [phone]  If you have a behavioral health crisis you have several options, all of which are available 24 hours a day, 7 days a week:  *Call Mobile Crisis and a clinician will come to you: [phone] *Call 911 *Go to the [facility] at [address] [place], [place] *Go to your local hospital emergency department

## 2014-05-08 NOTE — H&P (Signed)
[place] OBS UNIT H&P   Pt seen and chart reviewed. Pt seen by Dr. Dwyane Dee and this NP yesterday; continue to agree with assessment below. Pt seen again today by Dr. Dwyane Dee and this NP. Chart updated to reflect current patient status.    Reason for Consult:  Depression with suicidal ideations Referring Physician:  ED MD [name] is an 53 y.o. female. Total Time spent with patient: 20 minutes  Assessment: AXIS I:  Major Depression, Recurrent severe, Substance Abuse and Substance Induced Mood Disorder AXIS II:  Deferred AXIS III:   Past Medical History  Diagnosis Date  . 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 Followed by Dr. Louanne Skye  . Hypertension   . Cocaine abuse     ongoing with resultant vaculitis.  . Depression   . Normocytic anemia     BL Hgb 9.8-12. Last anemia panel 04/2010 - showing Fe 19, ferritin 101.  Pt on monthly B12 injections  . 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 CAP (community acquired pneumonia) 03/2014 X 2  . Daily headache   . Migraines     "probably 5-6/yr" (04/21/2014)  . Rheumatoid arthritis(714.0)     patient reported  . Inflammatory arthritis    AXIS IV:  economic problems, housing problems, other psychosocial or environmental problems, problems related to social environment, problems with access to health care services and problems with primary support group AXIS V:  41-50 serious symptoms    Subjective:   [name] is a 53 y.o. female patient admitted with suicidal ideation and substance abuse. Pt hs a longstanding mental health history and is well known to this NP and Dr. Dwyane Dee who assesses her in ED today. Pt is crying throughout the assessment and stating SI with intent secondary to depression, feelings of isolation, and substance abuse. Pt reports that she is not suicidal now that she has a place to stay, as assisted by [facility] TTS Staff. Pt will discharge to a shelter with followup appointments.   HPI:  [name] is an 53 y.o. female presenting to [facility] ED with suicidal ideations. Pt stated "I am depressed and I feel like killing myself". Pt is currently endorsing SI but denies having a plan. Pt did not report any previous suicidal attempts but reported that she has been hospitalized in the past. Pt denies HI and AVH at this time but reported that she has experienced auditory hallucinations in the past. Pt reported that she is currently receiving mental health services through [place]. Pt denied having access to weapons and did not report any pending criminal charges or upcoming court dates. Pt is endorsing some depressive symptoms and shared that she is dealing with multiple stressors such as her housing situation and being estranged from her family. Pt shared that she has not seen her family in approximately 18 years. Pt reported that she has smoked crack/cocaine since she was 53 years old and was able to remain clean for 3 months. Pt reported that she recently relapsed after thinking about her family. Pt did not report any other illicit substance or alcohol use. Pt reported that she has been staying at a homeless shelter and her time is almost up there. Pt did not report any physical, sexual or verbal abuse but became tearful when she was asked about it. Pt shared that she has a good support system with her friends. Pt is alert and oriented x3. Pt  is calm and cooperative during this assessment. Pt mood is depressed and her affect is congruent with her mood. Pt thought process is coherent and relevant. Pt speech is logical and coherent.     HPI Elements:   Location:  generalized. Quality:  acute. Severity:  severe. Timing:  constant. Duration:  couple of weeks. Context:  stressors.  Past Psychiatric History: Past Medical History  Diagnosis Date  . 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 Followed by Dr. Louanne Skye  . Hypertension   . Cocaine abuse     ongoing with resultant vaculitis.  . Depression   .  Normocytic anemia     BL Hgb 9.8-12. Last anemia panel 04/2010 - showing Fe 19, ferritin 101.  Pt on monthly B12 injections  . 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 CAP (community acquired pneumonia) 03/2014 X 2  . Daily headache   . Migraines     "probably 5-6/yr" (04/21/2014)  . Rheumatoid arthritis(714.0)     patient reported  . Inflammatory arthritis     reports that she has been smoking Cigarettes.  She has a 10.2 pack-year smoking history. She has never used smokeless tobacco. She reports that she uses illicit drugs ("Crack" cocaine, Cocaine, and Marijuana). She reports that she does not drink alcohol. Family History  Problem Relation Age of Onset  . Breast cancer Mother     Breast cancer  . Alcohol abuse Mother   . Colon cancer Maternal Aunt 66  . Alcohol abuse Father          Abuse/Neglect [facility]) Physical Abuse: Denies Verbal Abuse: Denies Sexual Abuse: Denies Allergies:   Allergies  Allergen Reactions  . Acetaminophen Swelling    REACTION: eyelid swelling    ACT Assessment Complete:  Yes:    Educational Status    Risk to Self:    Risk to Others:    Abuse: Abuse/Neglect Assessment (Assessment to be complete while patient is alone) Physical Abuse: Denies Verbal Abuse: Denies Sexual Abuse: Denies Exploitation of patient/patient's resources: Denies Self-Neglect: Denies  Prior Inpatient Therapy:    Prior Outpatient Therapy:    Additional Information:                    Objective: Blood pressure 123/64, pulse 97, temperature 99.1 F (37.3 C), temperature source Oral, resp. rate 16, SpO2 100.00%.There is no weight on file to calculate BMI. Results for orders placed during the hospital encounter of 05/07/14 (from the past 72 hour(s))  CBC     Status: Abnormal   Collection Time    05/07/14  1:57 AM      Result Value Ref Range   WBC 8.9  4.0 - 10.5 K/uL   RBC 4.80  3.87 - 5.11 MIL/uL   Hemoglobin 14.1  12.0 - 15.0  g/dL   HCT 41.0  36.0 - 46.0 %   MCV 85.4  78.0 - 100.0 fL   MCH 29.4  26.0 - 34.0 pg   MCHC 34.4  30.0 - 36.0 g/dL   RDW 16.0 (*) 11.5 - 15.5 %   Platelets 251  150 - 400 K/uL  COMPREHENSIVE METABOLIC PANEL     Status: Abnormal   Collection Time    05/07/14  1:57 AM      Result Value Ref Range   Sodium 134 (*) 137 - 147 mEq/L   Potassium 4.6  3.7 - 5.3 mEq/L   Chloride 98  96 -  112 mEq/L   CO2 20  19 - 32 mEq/L   Glucose, Bld 112 (*) 70 - 99 mg/dL   BUN 33 (*) 6 - 23 mg/dL   Creatinine, Ser 1.42 (*) 0.50 - 1.10 mg/dL   Calcium 9.8  8.4 - 10.5 mg/dL   Total Protein 7.7  6.0 - 8.3 g/dL   Albumin 3.9  3.5 - 5.2 g/dL   AST 14  0 - 37 U/L   ALT 17  0 - 35 U/L   Alkaline Phosphatase 90  39 - 117 U/L   Total Bilirubin 0.5  0.3 - 1.2 mg/dL   GFR calc non Af Amer 42 (*) >90 mL/min   GFR calc Af Amer 49 (*) >90 mL/min   Comment: (NOTE)     The eGFR has been calculated using the CKD EPI equation.     This calculation has not been validated in all clinical situations.     eGFR's persistently <90 mL/min signify possible Chronic Kidney     Disease.   Anion gap 16 (*) 5 - 15  PRO B NATRIURETIC PEPTIDE     Status: Abnormal   Collection Time    05/07/14  1:57 AM      Result Value Ref Range   Pro B Natriuretic peptide (BNP) 169.9 (*) 0 - 125 pg/mL  MAGNESIUM     Status: None   Collection Time    05/07/14  1:57 AM      Result Value Ref Range   Magnesium 2.3  1.5 - 2.5 mg/dL  PROTIME-INR     Status: None   Collection Time    05/07/14  1:57 AM      Result Value Ref Range   Prothrombin Time 13.4  11.6 - 15.2 seconds   INR 1.02  0.00 - 1.49  TROPONIN I     Status: None   Collection Time    05/07/14  1:57 AM      Result Value Ref Range   Troponin I <0.30  <0.30 ng/mL   Comment:            Due to the release kinetics of cTnI,     a negative result within the first hours     of the onset of symptoms does not rule out     myocardial infarction with certainty.     If myocardial  infarction is still suspected,     repeat the test at appropriate intervals.  URINE RAPID DRUG SCREEN (HOSP PERFORMED)     Status: Abnormal   Collection Time    05/07/14  9:58 AM      Result Value Ref Range   Opiates NONE DETECTED  NONE DETECTED   Cocaine POSITIVE (*) NONE DETECTED   Benzodiazepines NONE DETECTED  NONE DETECTED   Amphetamines NONE DETECTED  NONE DETECTED   Tetrahydrocannabinol POSITIVE (*) NONE DETECTED   Barbiturates NONE DETECTED  NONE DETECTED   Comment:            DRUG SCREEN FOR MEDICAL PURPOSES     ONLY.  IF CONFIRMATION IS NEEDED     FOR ANY PURPOSE, NOTIFY LAB     WITHIN 5 DAYS.                LOWEST DETECTABLE LIMITS     FOR URINE DRUG SCREEN     Drug Class       Cutoff (ng/mL)     Amphetamine      1000  Barbiturate      200     Benzodiazepine   426     Tricyclics       834     Opiates          300     Cocaine          300     THC              50  URINALYSIS, ROUTINE W REFLEX MICROSCOPIC     Status: Abnormal   Collection Time    05/07/14  9:58 AM      Result Value Ref Range   Color, Urine YELLOW  YELLOW   APPearance CLOUDY (*) CLEAR   Specific Gravity, Urine 1.026  1.005 - 1.030   pH 5.5  5.0 - 8.0   Glucose, UA NEGATIVE  NEGATIVE mg/dL   Hgb urine dipstick TRACE (*) NEGATIVE   Bilirubin Urine NEGATIVE  NEGATIVE   Ketones, ur NEGATIVE  NEGATIVE mg/dL   Protein, ur NEGATIVE  NEGATIVE mg/dL   Urobilinogen, UA 0.2  0.0 - 1.0 mg/dL   Nitrite NEGATIVE  NEGATIVE   Leukocytes, UA SMALL (*) NEGATIVE  URINE MICROSCOPIC-ADD ON     Status: Abnormal   Collection Time    05/07/14  9:58 AM      Result Value Ref Range   Squamous Epithelial / LPF FEW (*) RARE   WBC, UA 0-2  <3 WBC/hpf   RBC / HPF 3-6  <3 RBC/hpf   Casts HYALINE CASTS (*) NEGATIVE   Labs are reviewed and UDS + cocaine, + THC.   Current Facility-Administered Medications  Medication Dose Route Frequency Provider Last Rate Last Dose  . alum & mag hydroxide-simeth (MAALOX/MYLANTA)  200-200-20 MG/5ML suspension 30 mL  30 mL Oral PRN Benjamine Mola, FNP      . amitriptyline (ELAVIL) tablet 50 mg  50 mg Oral QHS Laverle Hobby, PA-C   50 mg at 05/07/14 2108  . ondansetron (ZOFRAN) tablet 4 mg  4 mg Oral Q8H PRN Benjamine Mola, FNP      . traMADol Veatrice Bourbon) tablet 50 mg  50 mg Oral Q6H PRN Laverle Hobby, PA-C   50 mg at 05/08/14 1505    Psychiatric Specialty Exam:     Blood pressure 123/64, pulse 97, temperature 99.1 F (37.3 C), temperature source Oral, resp. rate 16, SpO2 100.00%.There is no weight on file to calculate BMI.  General Appearance: Disheveled  Eye Contact::  Poor  Speech:  Slurred  Volume:  Normal  Mood:  Depressed and Hopeless  Affect:  Congruent, tearful  Thought Process:  Coherent  Orientation:  Full (Time, Place, and Person)  Thought Content:  Rumination  Suicidal Thoughts:  No  Homicidal Thoughts:  No  Memory:  Immediate;   Fair Recent;   Fair Remote;   Fair  Judgement:  Poor  Insight:  Fair  Psychomotor Activity:  Decreased  Concentration:  Fair  Recall:  [employer] of Knowledge:Fair  Language: Good  Akathisia:  No  Handed:  Right  AIMS (if indicated):     Assets:  Resilience  Sleep:      Musculoskeletal: Strength & Muscle Tone: within normal limits Gait & Station: normal Patient leans: N/A  Treatment Plan Summary: *Discharge home to shelter. Follow up with psychiatry and counseling via appointments coordinated with assistance from [facility] TTS.   Benjamine Mola, FNP-BC 05/08/2014 3:17 PM

## 2014-05-08 NOTE — Discharge Summary (Signed)
Patient seen this afternoon, patient contracts for safety and states that she needs a shelter placement. Also discussed with patient the need to have an ACT team due  to her frequent visits to the ER for cocaine abuse and suicidal ideation.

## 2014-05-08 NOTE — Plan of Care (Addendum)
[facility] Observation Crisis Plan  Reason for Crisis Plan:  Crisis Stabilization   Plan of Care:  Outpatient treatment  Family Support:    No family support, but several close friends  Current Living Environment:   Homeless  Insurance:  [facility] Account   Name Acct ID Class Status Primary Coverage   Anilah, Huck [id number] [place]        Guarantor Account (for Hospital Account [id number])   Name Relation to Pt Service Area Active? Acct Type   Karolee Stamps  [facility]   Address Phone       [place], [place] [address] [phone])          Coverage Information (for Hospital Account [id number])   F/O Payor/Plan Precert #   [facility] MEDICAID/[place]ICA[place]    Subscriber Subscriber #   Analina, Filla [id number]   Address Phone   [address] [place], [place] [address] [phone]      Legal Guardian:   Self  Primary Care Provider:  Arman Filter, MD  Current Outpatient Providers:  Beverly Sessions  Psychiatrist:   Beverly Sessions  Counselor/Therapist:   Beverly Sessions  Compliant with Medications:  Yes  Additional Information: After consulting with Hampton Abbot, MD and Catalina Pizza, NP it has been determined that pt does not present a life threatening danger to herself or others.  However, in addition to ongoing treatment for her behavioral health needs, she also needs a shelter bed.  I spoke to [place] at [facility] in [place], and she reports that they currently have cots available.  I discussed this with the pt, and she reports that this will be satisfactory.  Pt is a regular client at [facility] where she receives routine outpatient services.  However, after discussing her treatment needs with her, she is interested in transitioning to the [facility] Team.  I spoke to [employer] at [facility], who took  demographic information; her team will call pt at the cell phone number provided by the pt, [phone].  In the meantime, pt will be advised to continue with services at [facility], or if she is unable to travel from [facility] to [place], at [facility].  Pt will be provided with a pass and fare money for transport by [facility] and by [facility].  Jalene Mullet, [facility] Triage Specialist [name] E[place] 8/4/20151:57 PM

## 2014-05-08 NOTE — Discharge Summary (Signed)
[name] OBS UNIT DISCHARGE SUMMARY & SRA   Pt seen and chart reviewed. Pt seen by Dr. Dwyane Dee and this NP yesterday; continue to agree with assessment below. Pt seen again today by Dr. Dwyane Dee and this NP. Chart updated to reflect current patient status.    Reason for Consult:  Depression with suicidal ideations Referring Physician:  ED MD SHARNISE BLOUGH is an 53 y.o. female. Total Time spent with patient: 45 minutes  Assessment: AXIS I:  Major Depression, Recurrent severe, Substance Abuse and Substance Induced Mood Disorder AXIS II:  Deferred AXIS III:   Past Medical History  Diagnosis Date  . 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 Followed by Dr. Louanne Skye  . Hypertension   . Cocaine abuse     ongoing with resultant vaculitis.  . Depression   . Normocytic anemia     BL Hgb 9.8-12. Last anemia panel 04/2010 - showing Fe 19, ferritin 101.  Pt on monthly B12 injections  . 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 CAP (community acquired pneumonia) 03/2014 X 2  . Daily headache   . Migraines     "probably 5-6/yr" (04/21/2014)  . Rheumatoid arthritis(714.0)     patient reported  . Inflammatory arthritis    AXIS IV:  economic problems, housing problems, other psychosocial or environmental problems, problems related to social environment, problems with access to health care services and problems with primary support group AXIS V:  41-50 serious symptoms    Subjective:   [name] is a 53 y.o. female patient admitted with suicidal ideation and substance abuse. Pt hs a longstanding mental health history and is well known to this NP and Dr. Dwyane Dee who assesses her in ED today. Pt is crying throughout the assessment and stating SI with intent secondary to depression, feelings of isolation, and substance abuse. Pt reports that she is not suicidal now that she has a place to stay, as assisted by [facility] TTS Staff. Pt will discharge to a shelter with followup appointments.    HPI:  [name] is an 53 y.o. female presenting to [facility] ED with suicidal ideations. Pt stated "I am depressed and I feel like killing myself". Pt is currently endorsing SI but denies having a plan. Pt did not report any previous suicidal attempts but reported that she has been hospitalized in the past. Pt denies HI and AVH at this time but reported that she has experienced auditory hallucinations in the past. Pt reported that she is currently receiving mental health services through [facility]. Pt denied having access to weapons and did not report any pending criminal charges or upcoming court dates. Pt is endorsing some depressive symptoms and shared that she is dealing with multiple stressors such as her housing situation and being estranged from her family. Pt shared that she has not seen her family in approximately 35 years. Pt reported that she has smoked crack/cocaine since she was 53 years old and was able to remain clean for 3 months. Pt reported that she recently relapsed after thinking about her family. Pt did not report any other illicit substance or alcohol use. Pt reported that she has been staying at a homeless shelter and her time is almost up there. Pt did not report any physical, sexual or verbal abuse but became tearful when she was asked about it. Pt shared that she has a good support system with her friends. Pt is alert and  oriented x3. Pt is calm and cooperative during this assessment. Pt mood is depressed and her affect is congruent with her mood. Pt thought process is coherent and relevant. Pt speech is logical and coherent.     HPI Elements:   Location:  generalized. Quality:  acute. Severity:  severe. Timing:  constant. Duration:  couple of weeks. Context:  stressors.  Past Psychiatric History: Past Medical History  Diagnosis Date  . 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 Followed by Dr. Louanne Skye  . Hypertension   . Cocaine abuse     ongoing with resultant vaculitis.  .  Depression   . Normocytic anemia     BL Hgb 9.8-12. Last anemia panel 04/2010 - showing Fe 19, ferritin 101.  Pt on monthly B12 injections  . 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 CAP (community acquired pneumonia) 03/2014 X 2  . Daily headache   . Migraines     "probably 5-6/yr" (04/21/2014)  . Rheumatoid arthritis(714.0)     patient reported  . Inflammatory arthritis     reports that she has been smoking Cigarettes.  She has a 10.2 pack-year smoking history. She has never used smokeless tobacco. She reports that she uses illicit drugs ("Crack" cocaine, Cocaine, and Marijuana). She reports that she does not drink alcohol. Family History  Problem Relation Age of Onset  . Breast cancer Mother     Breast cancer  . Alcohol abuse Mother   . Colon cancer Maternal Aunt 65  . Alcohol abuse Father          Abuse/Neglect [facility]) Physical Abuse: Denies Verbal Abuse: Denies Sexual Abuse: Denies Allergies:   Allergies  Allergen Reactions  . Acetaminophen Swelling    REACTION: eyelid swelling    ACT Assessment Complete:  Yes:    Educational Status    Risk to Self:    Risk to Others:    Abuse: Abuse/Neglect Assessment (Assessment to be complete while patient is alone) Physical Abuse: Denies Verbal Abuse: Denies Sexual Abuse: Denies Exploitation of patient/patient's resources: Denies Self-Neglect: Denies  Prior Inpatient Therapy:    Prior Outpatient Therapy:    Additional Information:                    Objective: Blood pressure 123/64, pulse 97, temperature 99.1 F (37.3 C), temperature source Oral, resp. rate 16, SpO2 100.00%.There is no weight on file to calculate BMI. Results for orders placed during the hospital encounter of 05/07/14 (from the past 72 hour(s))  CBC     Status: Abnormal   Collection Time    05/07/14  1:57 AM      Result Value Ref Range   WBC 8.9  4.0 - 10.5 K/uL   RBC 4.80  3.87 - 5.11 MIL/uL   Hemoglobin  14.1  12.0 - 15.0 g/dL   HCT 41.0  36.0 - 46.0 %   MCV 85.4  78.0 - 100.0 fL   MCH 29.4  26.0 - 34.0 pg   MCHC 34.4  30.0 - 36.0 g/dL   RDW 16.0 (*) 11.5 - 15.5 %   Platelets 251  150 - 400 K/uL  COMPREHENSIVE METABOLIC PANEL     Status: Abnormal   Collection Time    05/07/14  1:57 AM      Result Value Ref Range   Sodium 134 (*) 137 - 147 mEq/L   Potassium 4.6  3.7 - 5.3 mEq/L   Chloride  98  96 - 112 mEq/L   CO2 20  19 - 32 mEq/L   Glucose, Bld 112 (*) 70 - 99 mg/dL   BUN 33 (*) 6 - 23 mg/dL   Creatinine, Ser 1.42 (*) 0.50 - 1.10 mg/dL   Calcium 9.8  8.4 - 10.5 mg/dL   Total Protein 7.7  6.0 - 8.3 g/dL   Albumin 3.9  3.5 - 5.2 g/dL   AST 14  0 - 37 U/L   ALT 17  0 - 35 U/L   Alkaline Phosphatase 90  39 - 117 U/L   Total Bilirubin 0.5  0.3 - 1.2 mg/dL   GFR calc non Af Amer 42 (*) >90 mL/min   GFR calc Af Amer 49 (*) >90 mL/min   Comment: (NOTE)     The eGFR has been calculated using the CKD EPI equation.     This calculation has not been validated in all clinical situations.     eGFR's persistently <90 mL/min signify possible Chronic Kidney     Disease.   Anion gap 16 (*) 5 - 15  PRO B NATRIURETIC PEPTIDE     Status: Abnormal   Collection Time    05/07/14  1:57 AM      Result Value Ref Range   Pro B Natriuretic peptide (BNP) 169.9 (*) 0 - 125 pg/mL  MAGNESIUM     Status: None   Collection Time    05/07/14  1:57 AM      Result Value Ref Range   Magnesium 2.3  1.5 - 2.5 mg/dL  PROTIME-INR     Status: None   Collection Time    05/07/14  1:57 AM      Result Value Ref Range   Prothrombin Time 13.4  11.6 - 15.2 seconds   INR 1.02  0.00 - 1.49  TROPONIN I     Status: None   Collection Time    05/07/14  1:57 AM      Result Value Ref Range   Troponin I <0.30  <0.30 ng/mL   Comment:            Due to the release kinetics of cTnI,     a negative result within the first hours     of the onset of symptoms does not rule out     myocardial infarction with certainty.     If  myocardial infarction is still suspected,     repeat the test at appropriate intervals.  URINE RAPID DRUG SCREEN (HOSP PERFORMED)     Status: Abnormal   Collection Time    05/07/14  9:58 AM      Result Value Ref Range   Opiates NONE DETECTED  NONE DETECTED   Cocaine POSITIVE (*) NONE DETECTED   Benzodiazepines NONE DETECTED  NONE DETECTED   Amphetamines NONE DETECTED  NONE DETECTED   Tetrahydrocannabinol POSITIVE (*) NONE DETECTED   Barbiturates NONE DETECTED  NONE DETECTED   Comment:            DRUG SCREEN FOR MEDICAL PURPOSES     ONLY.  IF CONFIRMATION IS NEEDED     FOR ANY PURPOSE, NOTIFY LAB     WITHIN 5 DAYS.                LOWEST DETECTABLE LIMITS     FOR URINE DRUG SCREEN     Drug Class       Cutoff (ng/mL)     Amphetamine  1000     Barbiturate      200     Benzodiazepine   829     Tricyclics       562     Opiates          300     Cocaine          300     THC              50  URINALYSIS, ROUTINE W REFLEX MICROSCOPIC     Status: Abnormal   Collection Time    05/07/14  9:58 AM      Result Value Ref Range   Color, Urine YELLOW  YELLOW   APPearance CLOUDY (*) CLEAR   Specific Gravity, Urine 1.026  1.005 - 1.030   pH 5.5  5.0 - 8.0   Glucose, UA NEGATIVE  NEGATIVE mg/dL   Hgb urine dipstick TRACE (*) NEGATIVE   Bilirubin Urine NEGATIVE  NEGATIVE   Ketones, ur NEGATIVE  NEGATIVE mg/dL   Protein, ur NEGATIVE  NEGATIVE mg/dL   Urobilinogen, UA 0.2  0.0 - 1.0 mg/dL   Nitrite NEGATIVE  NEGATIVE   Leukocytes, UA SMALL (*) NEGATIVE  URINE MICROSCOPIC-ADD ON     Status: Abnormal   Collection Time    05/07/14  9:58 AM      Result Value Ref Range   Squamous Epithelial / LPF FEW (*) RARE   WBC, UA 0-2  <3 WBC/hpf   RBC / HPF 3-6  <3 RBC/hpf   Casts HYALINE CASTS (*) NEGATIVE   Labs are reviewed and UDS + cocaine, + THC.   Current Facility-Administered Medications  Medication Dose Route Frequency Provider Last Rate Last Dose  . alum & mag hydroxide-simeth  (MAALOX/MYLANTA) 200-200-20 MG/5ML suspension 30 mL  30 mL Oral PRN Benjamine Mola, FNP      . amitriptyline (ELAVIL) tablet 50 mg  50 mg Oral QHS Laverle Hobby, PA-C   50 mg at 05/07/14 2108  . ondansetron (ZOFRAN) tablet 4 mg  4 mg Oral Q8H PRN Benjamine Mola, FNP      . traMADol Veatrice Bourbon) tablet 50 mg  50 mg Oral Q6H PRN Laverle Hobby, PA-C   50 mg at 05/08/14 1505    Psychiatric Specialty Exam:     Blood pressure 123/64, pulse 97, temperature 99.1 F (37.3 C), temperature source Oral, resp. rate 16, SpO2 100.00%.There is no weight on file to calculate BMI.  General Appearance: Disheveled  Eye Contact::  Poor  Speech:  Slurred  Volume:  Normal  Mood:  Depressed and Hopeless  Affect:  Congruent, tearful  Thought Process:  Coherent  Orientation:  Full (Time, Place, and Person)  Thought Content:  Rumination  Suicidal Thoughts:  No  Homicidal Thoughts:  No  Memory:  Immediate;   Fair Recent;   Fair Remote;   Fair  Judgement:  Poor  Insight:  Fair  Psychomotor Activity:  Decreased  Concentration:  Fair  Recall:  [employer] of Knowledge:Fair  Language: Good  Akathisia:  No  Handed:  Right  AIMS (if indicated):     Assets:  Resilience  Sleep:      Musculoskeletal: Strength & Muscle Tone: within normal limits Gait & Station: normal Patient leans: N/A  Treatment Plan Summary: *Discharge home to shelter. Follow up with psychiatry and counseling via appointments coordinated with assistance from [facility] TTS.    Suicide Risk Assessment     Nursing information obtained  from:  Patient Demographic factors:  Divorced or widowed;Low socioeconomic status;Unemployed Current Mental Status:  Suicidal ideation indicated by patient Loss Factors:  Decline in physical health;Financial problems / change in socioeconomic status Historical Factors:  Prior suicide attempts;Impulsivity Risk Reduction Factors:  Religious beliefs about death Total Time spent with patient: 45  minutes  CLINICAL FACTORS:   Depression:   Anhedonia Comorbid alcohol abuse/dependence Hopelessness Impulsivity Insomnia Alcohol/Substance Abuse/Dependencies  Psychiatric Specialty Exam:     Blood pressure 123/64, pulse 97, temperature 99.1 F (37.3 C), temperature source Oral, resp. rate 16, SpO2 100.00%.There is no weight on file to calculate BMI.  See PSE ABOVE   COGNITIVE FEATURES THAT CONTRIBUTE TO RISK:  Closed-mindedness    SUICIDE RISK:   Mild:  Suicidal ideation of limited frequency, intensity, duration, and specificity.  There are no identifiable plans, no associated intent, mild dysphoria and related symptoms, good self-control (both objective and subjective assessment), few other risk factors, and identifiable protective factors, including available and accessible social support.  Benjamine Mola, FNP-BC 05/08/2014, 3:23 PM

## 2014-05-08 NOTE — Consult Note (Signed)
Patient seen, evaluated by me. Patient needs to be transferred to behavioral health observation unit for monitoring and treatment

## 2014-05-08 NOTE — Progress Notes (Signed)
[name], female   DOB: Jul 16, 1961, 53 y.o.   MRN: [id number] Pt awoke and requested prn for pain and her breakfast. She appears flat and depressed. She is calm and cooperative. She continues to endorse a depressed mood, but denies SI. States she feels like she slept well. She continues to deny HI and AVH. She contracts for safety. Disposition still pending assessment by MD/extender. Pain was in the left ear where she has necrosis to the tissue. PRN was provided and Probation officer will f/u response. Otherwise she offered no questions or concerns. Safety has been maintained with q15 minute checks. Will continue current POC and q15 minute checks for safety

## 2014-05-14 ENCOUNTER — Encounter: Payer: Self-pay | Admitting: Internal Medicine

## 2014-05-14 NOTE — Addendum Note (Signed)
Addended by: Hulan Fray on: 05/14/2014 07:04 PM   Modules accepted: Orders

## 2014-09-24 ENCOUNTER — Emergency Department (HOSPITAL_COMMUNITY)
Admission: EM | Admit: 2014-09-24 | Discharge: 2014-09-24 | Disposition: A | Discharge status: Home / Self Care | Payer: Medicaid Other | Attending: Emergency Medicine | Admitting: Emergency Medicine

## 2014-09-24 ENCOUNTER — Encounter (HOSPITAL_COMMUNITY): Payer: Self-pay | Admitting: *Deleted

## 2014-09-24 ENCOUNTER — Telehealth: Payer: Self-pay | Admitting: *Deleted

## 2014-09-24 ENCOUNTER — Ambulatory Visit (INDEPENDENT_AMBULATORY_CARE_PROVIDER_SITE_OTHER): Payer: Medicaid Other | Admitting: Internal Medicine

## 2014-09-24 ENCOUNTER — Encounter: Payer: Self-pay | Admitting: Internal Medicine

## 2014-09-24 VITALS — BP 161/93 | HR 97 | Temp 98.2°F | Ht 61.5 in | Wt 99.7 lb

## 2014-09-24 DIAGNOSIS — F329 Major depressive disorder, single episode, unspecified: Secondary | ICD-10-CM | POA: Insufficient documentation

## 2014-09-24 DIAGNOSIS — R21 Rash and other nonspecific skin eruption: Secondary | ICD-10-CM | POA: Diagnosis present

## 2014-09-24 DIAGNOSIS — Z792 Long term (current) use of antibiotics: Secondary | ICD-10-CM | POA: Diagnosis not present

## 2014-09-24 DIAGNOSIS — M1990 Unspecified osteoarthritis, unspecified site: Secondary | ICD-10-CM

## 2014-09-24 DIAGNOSIS — F14288 Cocaine dependence with other cocaine-induced disorder: Secondary | ICD-10-CM

## 2014-09-24 DIAGNOSIS — Y998 Other external cause status: Secondary | ICD-10-CM | POA: Insufficient documentation

## 2014-09-24 DIAGNOSIS — Z862 Personal history of diseases of the blood and blood-forming organs and certain disorders involving the immune mechanism: Secondary | ICD-10-CM | POA: Insufficient documentation

## 2014-09-24 DIAGNOSIS — S8991XA Unspecified injury of right lower leg, initial encounter: Secondary | ICD-10-CM | POA: Diagnosis not present

## 2014-09-24 DIAGNOSIS — W57XXXA Bitten or stung by nonvenomous insect and other nonvenomous arthropods, initial encounter: Secondary | ICD-10-CM | POA: Diagnosis not present

## 2014-09-24 DIAGNOSIS — Y9289 Other specified places as the place of occurrence of the external cause: Secondary | ICD-10-CM | POA: Diagnosis not present

## 2014-09-24 DIAGNOSIS — S59911A Unspecified injury of right forearm, initial encounter: Secondary | ICD-10-CM | POA: Insufficient documentation

## 2014-09-24 DIAGNOSIS — F1420 Cocaine dependence, uncomplicated: Secondary | ICD-10-CM

## 2014-09-24 DIAGNOSIS — Z7952 Long term (current) use of systemic steroids: Secondary | ICD-10-CM | POA: Insufficient documentation

## 2014-09-24 DIAGNOSIS — M069 Rheumatoid arthritis, unspecified: Secondary | ICD-10-CM | POA: Diagnosis not present

## 2014-09-24 DIAGNOSIS — I10 Essential (primary) hypertension: Secondary | ICD-10-CM

## 2014-09-24 DIAGNOSIS — Z720 Tobacco use: Secondary | ICD-10-CM | POA: Diagnosis not present

## 2014-09-24 DIAGNOSIS — Z8701 Personal history of pneumonia (recurrent): Secondary | ICD-10-CM | POA: Insufficient documentation

## 2014-09-24 DIAGNOSIS — M064 Inflammatory polyarthropathy: Secondary | ICD-10-CM | POA: Insufficient documentation

## 2014-09-24 DIAGNOSIS — Y9389 Activity, other specified: Secondary | ICD-10-CM | POA: Insufficient documentation

## 2014-09-24 DIAGNOSIS — G43909 Migraine, unspecified, not intractable, without status migrainosus: Secondary | ICD-10-CM | POA: Insufficient documentation

## 2014-09-24 LAB — CBC WITH DIFFERENTIAL/PLATELET
BASOS ABS: 0 10*3/uL (ref 0.0–0.1)
BASOS PCT: 0 % (ref 0–1)
Eosinophils Absolute: 0 10*3/uL (ref 0.0–0.7)
Eosinophils Relative: 1 % (ref 0–5)
HCT: 37 % (ref 36.0–46.0)
Hemoglobin: 13.1 g/dL (ref 12.0–15.0)
Lymphocytes Relative: 34 % (ref 12–46)
Lymphs Abs: 1.2 10*3/uL (ref 0.7–4.0)
MCH: 30.4 pg (ref 26.0–34.0)
MCHC: 35.4 g/dL (ref 30.0–36.0)
MCV: 85.8 fL (ref 78.0–100.0)
MONO ABS: 0.1 10*3/uL (ref 0.1–1.0)
MPV: 9.9 fL (ref 9.4–12.4)
Monocytes Relative: 4 % (ref 3–12)
NEUTROS ABS: 2.1 10*3/uL (ref 1.7–7.7)
NEUTROS PCT: 61 % (ref 43–77)
Platelets: 257 10*3/uL (ref 150–400)
RBC: 4.31 MIL/uL (ref 3.87–5.11)
RDW: 14.5 % (ref 11.5–15.5)
WBC: 3.4 10*3/uL — AB (ref 4.0–10.5)

## 2014-09-24 LAB — COMPLETE METABOLIC PANEL WITH GFR
ALBUMIN: 4 g/dL (ref 3.5–5.2)
ALT: <8 U/L (ref 0–35)
AST: 18 U/L (ref 0–37)
Alkaline Phosphatase: 77 U/L (ref 39–117)
BUN: 12 mg/dL (ref 6–23)
CALCIUM: 9.4 mg/dL (ref 8.4–10.5)
CHLORIDE: 105 meq/L (ref 96–112)
CO2: 24 mEq/L (ref 19–32)
Creat: 0.66 mg/dL (ref 0.50–1.10)
GFR, Est African American: >89 mL/min
GLUCOSE: 84 mg/dL (ref 70–99)
POTASSIUM: 3.9 meq/L (ref 3.5–5.3)
Sodium: 138 mEq/L (ref 135–145)
Total Bilirubin: 0.7 mg/dL (ref 0.2–1.2)
Total Protein: 6.8 g/dL (ref 6.0–8.3)

## 2014-09-24 MED ORDER — KETOROLAC TROMETHAMINE 30 MG/ML IJ SOLN
30.0000 mg | Freq: Once | INTRAMUSCULAR | Status: AC
  Administered 2014-09-24: 30 mg via INTRAMUSCULAR

## 2014-09-24 MED ORDER — PERMETHRIN 5 % EX CREA: TOPICAL_CREAM | CUTANEOUS | Status: DC

## 2014-09-24 MED ORDER — MELOXICAM 15 MG PO TABS: 15.0000 mg | ORAL_TABLET | Freq: Every day | ORAL | Status: DC

## 2014-09-24 NOTE — Discharge Instructions (Signed)
Take mobic as needed for pain. Use permethrin as directed for bed bugs. Refer to attached documents for more information.

## 2014-09-24 NOTE — Patient Instructions (Signed)
Thank you for your visit today.   Please return to the internal medicine clinic in 1 week if your symptoms do not improve.   I have sent refills to your pharmacy on your medications.  Your current medical regimen is effective;  continue present plan and take all medications as prescribed.   It seems that you have some inflammation in your hand and knee which mobic will likely help this.  I urge you to fill this prescription. You have been given a shot of toradol which will likely also help with inflammation.   Please be sure to bring all of your medications with you to every visit; this includes herbal supplements, vitamins, eye drops, and any over-the-counter medications.   Should you have any questions regarding your medications and/or any new or worsening symptoms, please be sure to call the clinic at [phone].   If you believe that you are suffering from a life threatening condition or one that may result in the loss of limb or function, then you should call 911 or proceed to the nearest Emergency Department.   Meloxicam tablets What is this medicine? MELOXICAM (mel OX i cam) is a non-steroidal anti-inflammatory drug (NSAID). It is used to reduce swelling and to treat pain. It may be used for osteoarthritis, rheumatoid arthritis, or juvenile rheumatoid arthritis. This medicine may be used for other purposes; ask your health care provider or pharmacist if you have questions. COMMON BRAND NAME(S): Mobic What should I tell my health care provider before I take this medicine? They need to know if you have any of these conditions: -asthma -cigarette smoker -coronary artery bypass graft (CABG) surgery within the past 2 weeks -drink more than 3 alcohol-containing drinks a day -heart disease or circulation problems such as heart failure or leg edema (fluid retention) -hemophilia or bleeding problems -high blood pressure -kidney disease -liver disease -stomach bleeding or ulcers -an  unusual or allergic reaction to meloxicam, aspirin, other NSAIDs, other medicines, foods, dyes, or preservatives -pregnant or trying to get pregnant -breast-feeding How should I use this medicine? Take this medicine by mouth with a full glass of water. Follow the directions on the prescription label. Take this medicine in an upright or sitting position. If possible take bedtime doses at least 10 minutes before lying down. You can take it with or without food. If it upsets your stomach, take it with food. Take your medicine at regular intervals. Do not take it more often than directed. A special MedGuide will be given to you by the pharmacist with each prescription and refill. Be sure to read this information carefully each time. Talk to your pediatrician regarding the use of this medicine in children. Special care may be needed. Elderly patients over 27 years old may have a stronger reaction to this medicine and need smaller doses. Overdosage: If you think you have taken too much of this medicine contact a poison control center or emergency room at once. NOTE: This medicine is only for you. Do not share this medicine with others. What if I miss a dose? If you miss a dose, take it as soon as you can. If it is almost time for your next dose, take only that dose. Do not take double or extra doses. What may interact with this medicine? -alcohol -aspirin -cidofovir -diuretics -lithium -medicines for high blood pressure -methotrexate -other drugs for inflammation like ketorolac, ibuprofen, and prednisone -pemetrexed -warfarin This list may not describe all possible interactions. Give your health care provider  a list of all the medicines, herbs, non-prescription drugs, or dietary supplements you use. Also tell them if you smoke, drink alcohol, or use illegal drugs. Some items may interact with your medicine. What should I watch for while using this medicine? Tell your doctor or healthcare  professional if your pain does not get better. Talk to your doctor before taking another medicine for pain. Do not treat yourself. This medicine does not prevent heart attack or stroke. If you take aspirin to prevent heart attack or stroke, talk with your doctor or health care professional. Do not take medicines such as ibuprofen and naproxen with this medicine. Side effects such as stomach upset, nausea, or ulcers may be more likely to occur. Many medicines available without a prescription should not be taken with this medicine. What side effects may I notice from receiving this medicine? Side effects that you should report to your doctor or health care professional as soon as possible: -black or bloody stools, blood in the urine or vomit -blurred vision -chest pain -difficulty breathing or wheezing -nausea or vomiting -skin rash, skin redness, blistering or peeling skin, hives, or itching -slurred speech or weakness on one side of the body -swelling of eyelids, throat, lips -unexplained weight gain or swelling -unusually weak or tired -yellowing of eyes or skin Side effects that usually do not require medical attention (report to your doctor or health care professional if they continue or are bothersome): -constipation or diarrhea -dizziness -gas or heartburn -stomach pain This list may not describe all possible side effects. Call your doctor for medical advice about side effects. You may report side effects to FDA at 1-800-FDA-1088. Where should I keep my medicine? Keep out of the reach of children. Store at room temperature between 15 and 30 degrees C (59 and 86 degrees F). Protect from moisture. Keep container tightly closed. Throw away any unused medicine after the expiration date. NOTE: This sheet is a summary. It may not cover all possible information. If you have questions about this medicine, talk to your doctor, pharmacist, or health care provider.  2015, Elsevier/Gold Standard.  (2010-01-13 21:15:42)

## 2014-09-24 NOTE — Telephone Encounter (Signed)
Pt presents to front desk crying, c/o pain and swelling to R hand and R knee, areas have reddish tinge to skin, she states all this started about 3 days ago, she states she is short of breath sometimes but has not been today, resp status at this time unremarkable

## 2014-09-24 NOTE — ED Provider Notes (Signed)
CSN: [id number]     Arrival date & time 09/24/14  1043 History  This chart was scribed for non-physician practitioner, Alvina Chou, PA-C working with Jasper Riling. Alvino Chapel, MD by Evelene Croon, ED Scribe. This patient was seen in room TR06C/TR06C and the patient's care was started at 11:25 AM.    Chief Complaint  Patient presents with  . Rash  . Pain      The history is provided by the patient. No language interpreter was used.    HPI Comments:  [name] is a 53 y.o. female with a h/o RA who presents to the Emergency Department complaining of moderate pain to her right knee and right forearm that has been constant since onset last night. She denies recent injury/trauma. Pt has a h/o similar pain "a long time ago". She has taken extra strength tylenol without relief. She denies fever.  Pt also reports rash to her BLE; she believes she may have bed bugs.  PCP: Dr. Daryll Drown     Past Medical History  Diagnosis Date  . 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 Followed by Dr. Louanne Skye  . Hypertension   . Cocaine abuse     ongoing with resultant vaculitis.  . Depression   . Normocytic anemia     BL Hgb 9.8-12. Last anemia panel 04/2010 - showing Fe 19, ferritin 101.  Pt on monthly B12 injections  . 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 CAP (community acquired pneumonia) 03/2014 X 2  . Daily headache   . Migraines     "probably 5-6/yr" (04/21/2014)  . Rheumatoid arthritis(714.0)     patient reported  . Inflammatory arthritis    Past Surgical History  Procedure Laterality Date  . Skin biopsy Bilateral 04/2010    shin nodules  . Irrigation and debridement abscess Bilateral 09/26/2013    Procedure: DEBRIDEMENT ULCERS BILATERAL THIGHS;  Surgeon: Gwenyth Ober, MD;  Location: [facility];  Service: General;  Laterality: Bilateral;  . Hernia repair      "stomach"   Family History  Problem Relation Age of Onset  . Breast cancer Mother     Breast  cancer  . Alcohol abuse Mother   . Colon cancer Maternal Aunt 7  . Alcohol abuse Father    History  Substance Use Topics  . Smoking status: Current Every Day Smoker -- 0.30 packs/day for 34 years    Types: Cigarettes  . Smokeless tobacco: Never Used     Comment: PATIENT HAS CUT BACK TO 5 CIGARETES A DAY  . Alcohol Use: No   OB History    No data available     Review of Systems  Musculoskeletal: Positive for arthralgias.  All other systems reviewed and are negative.     Allergies  Acetaminophen  Home Medications   Prior to Admission medications   Medication Sig Start Date End Date Taking? Authorizing Provider  buPROPion (WELLBUTRIN) 100 MG tablet Take 1 tablet (100 mg total) by mouth daily before breakfast. For depression 05/08/14   Benjamine Mola, FNP  oxyCODONE (OXY IR/ROXICODONE) 5 MG immediate release tablet Take 1 tablet (5 mg total) by mouth every 4 (four) hours as needed for moderate pain. 04/30/14   Arman Filter, MD  pantoprazole (PROTONIX) 40 MG tablet Take 1 tablet (40 mg total) by mouth 2 (two) times daily. 05/08/14   Benjamine Mola, FNP  predniSONE (DELTASONE) 20 MG tablet Take 1 tablet (  20 mg total) by mouth 2 (two) times daily with a meal. 05/08/14   Benjamine Mola, FNP  silver sulfADIAZINE (SILVADENE) 1 % cream Apply 1 application topically 2 (two) times daily. To affected skin areas 05/08/14   Benjamine Mola, FNP  traZODone (DESYREL) 50 MG tablet Take 1 tablet (50 mg total) by mouth at bedtime. For sleep 05/08/14   Benjamine Mola, FNP   BP 150/89 mmHg  Pulse 83  Temp(Src) 97.9 F (36.6 C) (Oral)  Resp 18  SpO2 100% Physical Exam  Constitutional: She is oriented to person, place, and time. She appears well-developed and well-nourished.  HENT:  Head: Normocephalic and atraumatic.  Cardiovascular: Normal rate.   Pulmonary/Chest: Effort normal.  Abdominal: She exhibits no distension.  Musculoskeletal:  Generalized swelling to right wrist and right knee no  obvious deformity  Generalized TTP of joints; No erythema or warmth noted to joints  Full active ROM of both affected joints (pt will not do this when prompted but can do this when distracted)  Neurological: She is alert and oriented to person, place, and time.  Skin: Skin is warm and dry.  Scattered papules with overlying excoriations and scabs to BLE  Psychiatric: She has a normal mood and affect.  Nursing note and vitals reviewed.   ED Course  Procedures   DIAGNOSTIC STUDIES:  Oxygen Saturation is 100% on RA, normal by my interpretation.    COORDINATION OF CARE:  11:30 AM Discussed treatment plan with pt at bedside and pt agreed to plan.  Labs Review Labs Reviewed - No data to display  Imaging Review No results found.   EKG Interpretation None      MDM   Final diagnoses:  Bed bug bite  Arthritis    11:34 AM Patient has no signs of septic joint on exam. Patient reports being unable to move right wrist but she is able to roll up her pants legs when asked to see her knees. 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 I do not think narcotic pain medication is appropriate at this time. Patient will have Permethrin for suspected bed bugs. Patient will have mobic for likely arthritis pain. Vitals stable and patient afebrile.   I personally performed the services described in this documentation, which was scribed in my presence. The recorded information has been reviewed and is accurate.    Alvina Chou, PA-C 09/24/14 [place] Alvino Chapel, MD 09/25/14 [phone]

## 2014-09-24 NOTE — Assessment & Plan Note (Addendum)
BP Readings from Last 3 Encounters:  09/24/14 161/93  09/24/14 169/89  05/08/14 134/77    Lab Results  Component Value Date   NA 134* 05/07/2014   K 4.6 05/07/2014   CREATININE 1.42* 05/07/2014    Assessment: Blood pressure control: moderately elevated Progress toward BP goal:  deteriorated Comments: pt reports not taking medications   Plan: Medications:  none;  likely d/t pain and cocaine induced  Other plans: refilled meds, check CMP, cbc/diff, and UA

## 2014-09-24 NOTE — ED Notes (Signed)
"  That Mobic doesn't work. I'm going down stairs to see my doctor".

## 2014-09-24 NOTE — Assessment & Plan Note (Addendum)
Pt p/w a 2 day h/o acute pain and mild swelling of the right hand and knee.  She is crying profusely during my exam.  She denies fever/chills.  States that she previously had this several years ago after getting a "bad batch" of crack/cocaine.  On exam, she has mild tenderness without erythema or warmth of either the right hand or knee.  ROM is limited by pain.  Also reports having "bed bugs."  Reports sexual intercourse about once every 3 weeks but consistently uses condoms.  Etiology likely d/t cocaine.   -79m toradol IM NOW -ED gave prescription for mobic and advised her to try this

## 2014-09-24 NOTE — Progress Notes (Signed)
[name], female   DOB: [birth year], 53 y.o.   MRN: [id number]    Subjective:   [name]    DOB: August 28, 1961 53 y.o.    MRN: [id number] Health Maintenance Due: Health Maintenance Due  Topic Date Due  . COLONOSCOPY  04/17/2013  . PAP SMEAR  09/18/2013  . INFLUENZA VACCINE  05/05/2014    _________________________________________________  HPI: [name] is a 53 y.o. female here for an acute visit.  Pt has a PMH outlined below.  Please see problem-based charting assessment and plan note for further details of medical issues addressed at today's visit.  PMH: Past Medical History  Diagnosis Date  . 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 Followed by Dr. Louanne Skye  . Hypertension   . Cocaine abuse     ongoing with resultant vaculitis.  . Depression   . Normocytic anemia     BL Hgb 9.8-12. Last anemia panel 04/2010 - showing Fe 19, ferritin 101.  Pt on monthly B12 injections  . 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 CAP (community acquired pneumonia) 03/2014 X 2  . Daily headache   . Migraines     "probably 5-6/yr" (04/21/2014)  . Rheumatoid arthritis(714.0)     patient reported  . Inflammatory arthritis     Medications: Current Outpatient Prescriptions on File Prior to Visit  Medication Sig Dispense Refill  . buPROPion (WELLBUTRIN) 100 MG tablet Take 1 tablet (100 mg total) by mouth daily before breakfast. For depression 30 tablet 0  . meloxicam (MOBIC) 15 MG tablet Take 1 tablet (15 mg total) by mouth daily. 20 tablet 0  . oxyCODONE (OXY IR/ROXICODONE) 5 MG immediate release tablet Take 1 tablet (5 mg total) by mouth every 4 (four) hours as needed for moderate pain. 30 tablet 0  . pantoprazole (PROTONIX) 40 MG tablet Take 1 tablet (40 mg total) by mouth 2 (two) times daily. 60 tablet 0  . permethrin (ELIMITE) 5 % cream Apply to affected area once 60 g 0  . predniSONE (DELTASONE) 20 MG tablet Take 1 tablet  (20 mg total) by mouth 2 (two) times daily with a meal.    . silver sulfADIAZINE (SILVADENE) 1 % cream Apply 1 application topically 2 (two) times daily. To affected skin areas 50 g 0  . traZODone (DESYREL) 50 MG tablet Take 1 tablet (50 mg total) by mouth at bedtime. For sleep 30 tablet 0   No current facility-administered medications on file prior to visit.    Allergies: Allergies  Allergen Reactions  . Acetaminophen Swelling    REACTION: eyelid swelling    FH: Family History  Problem Relation Age of Onset  . Breast cancer Mother     Breast cancer  . Alcohol abuse Mother   . Colon cancer Maternal Aunt 59  . Alcohol abuse Father     SH: History   Social History  . Marital Status: Single    Spouse Name: N/A    Number of Children: 0  . Years of Education: 11th grade   Occupational History  . Disability     since 2011, due to her rheumatoid arthritis   Social History Main Topics  . Smoking status: Current Every Day Smoker -- 0.30 packs/day for 34 years    Types: Cigarettes  . Smokeless tobacco: Never Used     Comment: PATIENT HAS CUT BACK TO 5 CIGARETES A DAY  .  Alcohol Use: No  . Drug Use: Yes    Special: "Crack" cocaine, Cocaine, Marijuana     Comment: 04/21/2014 "last used cocaine 02/2014;still smoke a little marijuana q now and then"  . Sexual Activity: No   Other Topics Concern  . None   Social History Narrative   Unemployed:  cleaning in past   Living at [place]; has Medicaid.   crack/cocaine use; pt denies IVDU   tobacco:  1/2 ppd, trying to quit   alcohol:  none        Review of Systems: Constitutional: Negative for fever, chills and weight loss.  Eyes: Negative for blurred vision.  Respiratory: Negative for cough and shortness of breath.  Cardiovascular: Negative for chest pain, palpitations and leg swelling.  Gastrointestinal: Negative for nausea, vomiting, abdominal pain, diarrhea, constipation and blood in stool.  Genitourinary:  Negative for dysuria, urgency and frequency.  Musculoskeletal: Negative for myalgias and back pain.  Neurological: Negative for dizziness, weakness and headaches.     Objective:   Vital Signs: Filed Vitals:   09/24/14 1352  BP: 161/93  Pulse: 97  Temp: 98.2 F (36.8 C)  TempSrc: Oral  Height: 5' 1.5" (1.562 m)  Weight: 99 lb 11.2 oz (45.224 kg)  SpO2: 100%      BP Readings from Last 3 Encounters:  09/24/14 161/93  09/24/14 169/89  05/08/14 134/77    Physical Exam: Constitutional: Vital signs reviewed.  Patient appears older than stated age.  She is sobbing and appears in distress.  Head: Normocephalic and atraumatic.   Eyes: PERRL, EOMI, conjunctivae nl, no scleral icterus.  Neck: Supple. Cardiovascular: RRR, no MRG. Pulmonary/Chest: normal effort, non-tender to palpation, CTAB, no wheezes, rales, or rhonchi. Abdominal: Thin.  Soft. NT/ND +BS. Musculoskeletal: Limited ROM of right hand and knee.  No effusion, erythema, or warmth.   Neurological: A&O x3, cranial nerves II-XII are grossly intact, moving all extremities. Extremities: 2+Radial pulses b/l; 2+DP b/l; no pitting edema. Skin: Warm, dry, intact.  Multiple scars on her extremities and ears bilaterally.     Assessment & Plan:   Assessment and plan was discussed and formulated with my attending.

## 2014-09-24 NOTE — Progress Notes (Signed)
Internal Medicine Clinic Attending  Case discussed with Dr. Gill soon after the resident saw the patient.  We reviewed the resident's history and exam and pertinent patient test results.  I agree with the assessment, diagnosis, and plan of care documented in the resident's note.  

## 2014-09-24 NOTE — ED Notes (Signed)
Pt reports pain to right forearm and right knee, swelling noted to forearm. Also reports having bed bugs and itching to entire body.

## 2014-09-25 LAB — URINALYSIS, MICROSCOPIC ONLY
Casts: NONE SEEN
Crystals: NONE SEEN

## 2014-09-25 LAB — URINALYSIS, ROUTINE W REFLEX MICROSCOPIC
Bilirubin Urine: NEGATIVE
Glucose, UA: NEGATIVE mg/dL
Ketones, ur: NEGATIVE mg/dL
LEUKOCYTES UA: NEGATIVE
Nitrite: NEGATIVE
Protein, ur: NEGATIVE mg/dL
Specific Gravity, Urine: 1.023 (ref 1.005–1.030)
UROBILINOGEN UA: 0.2 mg/dL (ref 0.0–1.0)
pH: 5.5 (ref 5.0–8.0)

## 2014-09-25 NOTE — Addendum Note (Signed)
Addended by: Jones Bales on: 09/25/2014 11:49 AM   Modules accepted: Orders

## 2014-09-25 NOTE — Assessment & Plan Note (Signed)
Pt presents to the clinic sobbing stating that she is in severe pain in her right hand and knee.  States she is living in different places and has not seen her family in many years.  She denies fevers/chills, N/V/D.  No h/o trauma.  She reports having similar symptoms when using a "bad batch" of cocaine.  She reports using cocaine two days ago and then the pain began.  She was seen in the ED in the AM and given mobic for pain.  She reports trying mobic in the past and it didn't help.  On exam, she is tender to palpation in the dorsum of the right hand and knee.  She has good ROM although limited by pain.  There is minimal swelling of the right hand and did not appreciate any swelling of the knee.  There was no warmth or erythema.   -advised her to try mobic for pain (although she said she would go to [facility]) -gave 60m IM toradol in clinic today (provided much relief for her pain-->she was no longer sobbing when she left) -will refer to social work

## 2014-09-26 LAB — URINE CULTURE: Colony Count: 10000

## 2014-10-01 ENCOUNTER — Ambulatory Visit: Payer: Self-pay | Admitting: Internal Medicine

## 2014-10-05 ENCOUNTER — Encounter (HOSPITAL_COMMUNITY): Payer: Self-pay | Admitting: Emergency Medicine

## 2014-10-05 ENCOUNTER — Emergency Department (HOSPITAL_COMMUNITY)
Admission: EM | Admit: 2014-10-05 | Discharge: 2014-10-05 | Disposition: A | Discharge status: Home / Self Care | Payer: Medicaid Other | Attending: Emergency Medicine | Admitting: Emergency Medicine

## 2014-10-05 DIAGNOSIS — Z720 Tobacco use: Secondary | ICD-10-CM | POA: Diagnosis not present

## 2014-10-05 DIAGNOSIS — H9203 Otalgia, bilateral: Secondary | ICD-10-CM | POA: Diagnosis present

## 2014-10-05 DIAGNOSIS — I10 Essential (primary) hypertension: Secondary | ICD-10-CM | POA: Insufficient documentation

## 2014-10-05 DIAGNOSIS — I776 Arteritis, unspecified: Secondary | ICD-10-CM

## 2014-10-05 DIAGNOSIS — Z79899 Other long term (current) drug therapy: Secondary | ICD-10-CM | POA: Diagnosis not present

## 2014-10-05 DIAGNOSIS — M069 Rheumatoid arthritis, unspecified: Secondary | ICD-10-CM | POA: Insufficient documentation

## 2014-10-05 DIAGNOSIS — Z862 Personal history of diseases of the blood and blood-forming organs and certain disorders involving the immune mechanism: Secondary | ICD-10-CM | POA: Diagnosis not present

## 2014-10-05 DIAGNOSIS — Z791 Long term (current) use of non-steroidal anti-inflammatories (NSAID): Secondary | ICD-10-CM | POA: Insufficient documentation

## 2014-10-05 DIAGNOSIS — F329 Major depressive disorder, single episode, unspecified: Secondary | ICD-10-CM | POA: Diagnosis not present

## 2014-10-05 DIAGNOSIS — G43909 Migraine, unspecified, not intractable, without status migrainosus: Secondary | ICD-10-CM | POA: Insufficient documentation

## 2014-10-05 LAB — CBC WITH DIFFERENTIAL/PLATELET
Basophils Absolute: 0 10*3/uL (ref 0.0–0.1)
Basophils Relative: 0 % (ref 0–1)
EOS ABS: 0 10*3/uL (ref 0.0–0.7)
Eosinophils Relative: 1 % (ref 0–5)
HCT: 32.6 % — ABNORMAL LOW (ref 36.0–46.0)
Hemoglobin: 11.4 g/dL — ABNORMAL LOW (ref 12.0–15.0)
LYMPHS ABS: 0.9 10*3/uL (ref 0.7–4.0)
Lymphocytes Relative: 27 % (ref 12–46)
MCH: 30.2 pg (ref 26.0–34.0)
MCHC: 35 g/dL (ref 30.0–36.0)
MCV: 86.2 fL (ref 78.0–100.0)
Monocytes Absolute: 0.1 10*3/uL (ref 0.1–1.0)
Monocytes Relative: 2 % — ABNORMAL LOW (ref 3–12)
Neutro Abs: 2.4 10*3/uL (ref 1.7–7.7)
Neutrophils Relative %: 70 % (ref 43–77)
PLATELETS: 247 10*3/uL (ref 150–400)
RBC: 3.78 MIL/uL — AB (ref 3.87–5.11)
RDW: 14 % (ref 11.5–15.5)
WBC: 3.4 10*3/uL — AB (ref 4.0–10.5)

## 2014-10-05 LAB — BASIC METABOLIC PANEL
ANION GAP: 12 (ref 5–15)
BUN: 11 mg/dL (ref 6–23)
CO2: 23 mmol/L (ref 19–32)
Calcium: 9.2 mg/dL (ref 8.4–10.5)
Chloride: 106 mEq/L (ref 96–112)
Creatinine, Ser: 1.02 mg/dL (ref 0.50–1.10)
GFR, EST AFRICAN AMERICAN: 73 mL/min — AB (ref >90)
GFR, EST NON AFRICAN AMERICAN: 63 mL/min — AB (ref >90)
GLUCOSE: 110 mg/dL — AB (ref 70–99)
POTASSIUM: 3 mmol/L — AB (ref 3.5–5.1)
SODIUM: 141 mmol/L (ref 135–145)

## 2014-10-05 MED ORDER — OXYCODONE HCL 5 MG PO TABS: 5.0000 mg | ORAL_TABLET | Freq: Four times a day (QID) | ORAL | Status: DC | PRN

## 2014-10-05 MED ORDER — CIPROFLOXACIN HCL 500 MG PO TABS: 500.0000 mg | ORAL_TABLET | Freq: Two times a day (BID) | ORAL | Status: DC

## 2014-10-05 MED ORDER — SULFAMETHOXAZOLE-TRIMETHOPRIM 800-160 MG PO TABS: 1.0000 | ORAL_TABLET | Freq: Two times a day (BID) | ORAL | Status: DC

## 2014-10-05 MED ORDER — PREDNISONE 20 MG PO TABS: 40.0000 mg | ORAL_TABLET | Freq: Every day | ORAL | Status: DC

## 2014-10-05 MED ORDER — POTASSIUM CHLORIDE CRYS ER 20 MEQ PO TBCR
40.0000 meq | EXTENDED_RELEASE_TABLET | Freq: Once | ORAL | Status: AC
  Administered 2014-10-05: 40 meq via ORAL
  Filled 2014-10-05: qty 2

## 2014-10-05 MED ORDER — FLUCONAZOLE 150 MG PO TABS: 150.0000 mg | ORAL_TABLET | ORAL | Status: DC

## 2014-10-05 NOTE — Discharge Instructions (Signed)
YOU MUST FOLLOW UP WITH YOUR PRIMARY CARE DOCTOR ON Monday.  PLEASE TAKE THE MEDICATIONS AS PRESCRIBED.  Vasculitis Vasculitis is when your blood vessels are inflamed. There are many different blood vessels in the body, and vasculitis can affect any of them. This includes large (veins and arteries) and small (capillaries) vessels. With vasculitis,   Blood vessel walls can become thick.  Blood vessels can become narrow.  Blood vessels can become weak. Sometimes, it becomes so weak that the blood vessel bulges out like a balloon. This is called an aneurysm. Aneurysms are rare but can be life-threatening.  Scarring can occur.  Not enough blood can flow through the blood vessels. All of these things can damage many parts of the body, including the muscles, kidneys, lungs and brain. There are many types of vasculitis. Some types are short-term (acute), while others are long-term (chronic). Some types may go away without treatment, and others may need to be treated for a long time.  CAUSES  Vasculitis occurs when the body's immune system (which fights germs and disease) makes a mistake. It attacks its own blood vessels. This causes inflammation (the body's way of reacting to injury or infection).   Why this happens is usually not known. The condition is then called primary vasculitis.  Sometimes, something triggers the inflammation. This is called secondary vasculitis. Possible causes include:  Infections.  An immune system disease. Examples include lupus, rheumatoid arthritis and scleroderma.  An allergic reaction to a medicine.  Cancer that affects blood cells. This includes leukemia and lymphoma.  Males and females of all ages and races can develop vasculitis. Some risk factors make vasculitis more likely. These include:  Smoking.  Stress.  Physical injury. SYMPTOMS  There are more than 20 types of vasculitis. Symptoms of each type vary, but some symptoms are common.  Many  people with vasculitis:  Have a fever.  Do not feel like eating.  Lose weight.  Feel very tired.  Have aches and pains.  Feel weak.  Start to not have feeling (numbness) in an area.  Symptoms for some types of vasculitis also could be:  Sores in the mouth or eyes.  Skin problems. This could be sores, spots or rashes.  Trouble seeing.  Trouble breathing.  Blood in the urine.  Headaches.  Pain in the abdomen.  Stuffy or bloody nose. DIAGNOSIS  Vasculitis symptoms are similar to symptoms of many other conditions. That can make it hard to tell if you have vasculitis. To be sure, your caregiver will ask about your symptoms and do a physical exam. Certain tests may be necessary, such as:   A complete blood count (CBC). This test shows how many red blood cells are in your blood. Not having enough red blood cells (anemic) can result from vasculitis.  Erythrocyte sedimentation (also called sed rate test). It measures inflammation in the body.  C reactive protein (CRP). This also shows if there is inflammation.  Anti-neutrophil cytoplasmic antibodies (ANCA). This can tell if the immune system is reacting to certain cells in the blood.  A urine test. This checks for blood or protein in the urine. That could be a sign of kidney damage from vasculitis.  Imaging tests. These tests create pictures from inside the body. Options include:  X-rays.  Computed tomography (CT) scan. This uses X-rays guided by a computer.  Ultrasound. It creates an image using sound waves.  Magnetic resonance imaging (MRI). It uses radio waves, magnets and a computer.  Angiography.  A dye is put into your blood vessels. Then, an X-ray is taken of them.  A biopsy of a blood vessel. This means your caregiver will take out a small piece of a blood vessel. Then, it is checked under a microscope. This is an important test. It often is the best way to know for sure if you have vasculitis. TREATMENT    Treatment will depend on the type of vasculitis and how severe it is. Often, you will need to see a specialist in immunologic diseases (rheumatologist).  Some types of vasculitis may go away without treatment.  Some types need only over-the-counter drugs.  Prescription medicines are used to treat many types of vasculitis. For example:  Corticosteroids. These are the drugs used most often. They are very powerful. Usually, a high dose is taken until symptoms improve. Then, the dose is gradually decreased. Using corticosteroids for a long time can cause problems. They can make muscles and bones weak. They can cause blood pressure to go up, and cause diabetes. Also, people often gain weight when they take corticosteroids.  Cytotoxic drugs. These kill cells that cause inflammation. Sometimes, they are used if corticosteroids do not help. Other times, both medications are taken.  Surgery. This may be needed to repair a blood vessel that has bulged out (aneurysm).  Treatment can sometimes cure your disease. Other times, it can put the disease in remission (no symptoms). Increased treatment and reevaluation might be necessary if your disease comes back or flares. HOME CARE INSTRUCTIONS   Take any medications that your caregiver prescribes. Follow the directions carefully.  Watch for any problems that can be caused by a drug (side effects). Tell your caregiver right away if you notice any changes or problems.  Keep all appointments for checkups. This is important to help your caregiver watch for side effects. Checkups may include:  Periodic blood tests.  Bone density testing. This checks how strong or weak your bones are.  Blood pressure checks. If your blood pressure rises, you may need to take a drug to control it while you are taking corticosteroids.  Blood sugar checks. This is to be sure you are not developing diabetes. If you have diabetes, corticosteroid medications may make it worse  and require increased treatment.  Exercise. First, talk with your caregiver about what would be OK for you to do. Aerobic exercise (which increases your heart rate) is often suggested. It includes walking. This type of exercise is good because it helps prevent bone loss. It also helps control your blood pressure.  Follow a healthy diet. Include good sources of protein in your diet. Also include fruits, vegetables and whole grains. Your caregiver can refer you to an expert on healthy eating (dietitian) for more detailed advice.  Learn as much as you can about vasculitis. Understanding your condition can help you cope with it. Coping can be hard because this may be something you will have to live with for years.  Consider joining a support group. It often helps to talk about your worries with others who have the same problems.  Tell your caregiver if you feel stressed, anxious or depressed. Your caregiver may refer you to a specialist, or recommend medication to relieve your symptoms. SEEK MEDICAL CARE IF:   The symptoms that led to your diagnosis return.  You develop worsening fever, fatigue, headache, weight loss or pain in your jaw.  You develop signs of infection. Infections can be worse if you are on corticosteroid medication.  You develop any new or unexplained symptoms of disease. SEEK IMMEDIATE MEDICAL CARE IF:   Your eyesight changes.  Pain does not go away, even after taking medication.  You feel pain in your chest or abdomen.  You have trouble breathing.  One side of your face or body becomes suddenly weak or numb.  Your nose bleeds.  There is blood in your urine.  You develop a fever of more than 102 F (38.9 C). Document Released: 07/18/2009 Document Revised: 12/14/2011 Document Reviewed: 11/15/2013 [facility] Patient Information 2015 [place], [place]. This information is not intended to replace advice given to you by your health care provider. Make sure you discuss any  questions you have with your health care provider.

## 2014-10-05 NOTE — ED Provider Notes (Signed)
CSN: [id number]     Arrival date & time 10/05/14  1617 History   First MD Initiated Contact with Patient 10/05/14 1828     Chief Complaint  Patient presents with  . Otalgia     (Consider location/radiation/quality/duration/timing/severity/associated sxs/prior Treatment) HPI Comments: Patient with past medical history of cocaine abuse and ongoing vasculitis, presents to the emergency department with chief complaints of bilateral ear pain. 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 She was admitted earlier this year for the same, and was treated with Bactrim, Cipro, Diflucan, and steroids.  She states that the symptoms worsened 2 days ago. She denies any fevers chills. Symptoms are aggravated with palpation. She has not taken anything to alleviate her symptoms.  The history is provided by the patient. No language interpreter was used.    Past Medical History  Diagnosis Date  . 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 Followed by Dr. Louanne Skye  . Hypertension   . Cocaine abuse     ongoing with resultant vaculitis.  . Depression   . Normocytic anemia     BL Hgb 9.8-12. Last anemia panel 04/2010 - showing Fe 19, ferritin 101.  Pt on monthly B12 injections  . 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 CAP (community acquired pneumonia) 03/2014 X 2  . Daily headache   . Migraines     "probably 5-6/yr" (04/21/2014)  . Rheumatoid arthritis(714.0)     patient reported  . Inflammatory arthritis    Past Surgical History  Procedure Laterality Date  . Skin biopsy Bilateral 04/2010    shin nodules  . Irrigation and debridement abscess Bilateral 09/26/2013    Procedure: DEBRIDEMENT ULCERS BILATERAL THIGHS;  Surgeon: Gwenyth Ober, MD;  Location: [place];  Service: General;  Laterality: Bilateral;  . Hernia repair      "stomach"   Family History  Problem Relation Age of Onset  . Breast cancer Mother     Breast  cancer  . Alcohol abuse Mother   . Colon cancer Maternal Aunt 87  . Alcohol abuse Father    History  Substance Use Topics  . Smoking status: Current Every Day Smoker -- 0.30 packs/day for 34 years    Types: Cigarettes  . Smokeless tobacco: Never Used     Comment: PATIENT HAS CUT BACK TO 5 CIGARETES A DAY  . Alcohol Use: No   OB History    No data available     Review of Systems  Constitutional: Negative for fever and chills.  HENT: Positive for ear pain.   Respiratory: Negative for shortness of breath.   Cardiovascular: Negative for chest pain.  Gastrointestinal: Negative for nausea, vomiting, diarrhea and constipation.  Genitourinary: Negative for dysuria.  All other systems reviewed and are negative.     Allergies  Acetaminophen  Home Medications   Prior to Admission medications   Medication Sig Start Date End Date Taking? Authorizing Provider  buPROPion (WELLBUTRIN) 100 MG tablet Take 1 tablet (100 mg total) by mouth daily before breakfast. For depression 05/08/14   Benjamine Mola, FNP  meloxicam (MOBIC) 15 MG tablet Take 1 tablet (15 mg total) by mouth daily. 09/24/14   Alvina Chou, PA-C  oxyCODONE (OXY IR/ROXICODONE) 5 MG immediate release tablet Take 1 tablet (5 mg total) by mouth every 4 (four) hours as needed for moderate pain. 04/30/14   Arman Filter, MD  pantoprazole (PROTONIX) 40 MG tablet Take 1 tablet (40 mg total) by mouth 2 (two) times daily. 05/08/14   Benjamine Mola, FNP  permethrin (ELIMITE) 5 % cream Apply to affected area once 09/24/14   Alvina Chou, PA-C  predniSONE (DELTASONE) 20 MG tablet Take 1 tablet (20 mg total) by mouth 2 (two) times daily with a meal. 05/08/14   Benjamine Mola, FNP  silver sulfADIAZINE (SILVADENE) 1 % cream Apply 1 application topically 2 (two) times daily. To affected skin areas 05/08/14   Benjamine Mola, FNP  traZODone (DESYREL) 50 MG tablet Take 1 tablet (50 mg total) by mouth at bedtime. For sleep 05/08/14   Benjamine Mola, FNP   BP 153/89 mmHg  Pulse 107  Temp(Src) 97.8 F (36.6 C) (Oral)  Resp 18  SpO2 100% Physical Exam  Constitutional: She is oriented to person, place, and time. She appears well-developed and well-nourished.  HENT:  Head: Normocephalic and atraumatic.  Ears as below  Eyes: Conjunctivae and EOM are normal. Pupils are equal, round, and reactive to light.  Neck: Normal range of motion. Neck supple.  Cardiovascular: Normal rate and regular rhythm.  Exam reveals no gallop and no friction rub.   No murmur heard. Pulmonary/Chest: Effort normal and breath sounds normal. No respiratory distress. She has no wheezes. She has no rales. She exhibits no tenderness.  Abdominal: Soft. Bowel sounds are normal. She exhibits no distension and no mass. There is no tenderness. There is no rebound and no guarding.  Musculoskeletal: Normal range of motion. She exhibits no edema or tenderness.  Neurological: She is alert and oriented to person, place, and time.  Skin: Skin is warm and dry.  Psychiatric: She has a normal mood and affect. Her behavior is normal. Judgment and thought content normal.  Nursing note and vitals reviewed.   ED Course  Procedures (including critical care time) Results for orders placed or performed during the hospital encounter of 10/05/14  CBC with Differential  Result Value Ref Range   WBC 3.4 (L) 4.0 - 10.5 K/uL   RBC 3.78 (L) 3.87 - 5.11 MIL/uL   Hemoglobin 11.4 (L) 12.0 - 15.0 g/dL   HCT 32.6 (L) 36.0 - 46.0 %   MCV 86.2 78.0 - 100.0 fL   MCH 30.2 26.0 - 34.0 pg   MCHC 35.0 30.0 - 36.0 g/dL   RDW 14.0 11.5 - 15.5 %   Platelets 247 150 - 400 K/uL   Neutrophils Relative % 70 43 - 77 %   Neutro Abs 2.4 1.7 - 7.7 K/uL   Lymphocytes Relative 27 12 - 46 %   Lymphs Abs 0.9 0.7 - 4.0 K/uL   Monocytes Relative 2 (L) 3 - 12 %   Monocytes Absolute 0.1 0.1 - 1.0 K/uL   Eosinophils Relative 1 0 - 5 %   Eosinophils Absolute 0.0 0.0 - 0.7 K/uL   Basophils Relative 0 0  - 1 %   Basophils Absolute 0.0 0.0 - 0.1 K/uL  Basic metabolic panel  Result Value Ref Range   Sodium 141 135 - 145 mmol/L   Potassium 3.0 (L) 3.5 - 5.1 mmol/L   Chloride 106 96 - 112 mEq/L   CO2 23 19 - 32 mmol/L   Glucose, Bld 110 (H) 70 - 99 mg/dL   BUN 11 6 - 23 mg/dL   Creatinine, Ser 1.02 0.50 - 1.10 mg/dL   Calcium 9.2 8.4 - 10.5 mg/dL   GFR calc non Af Wyvonnia Lora  63 (L) >90 mL/min   GFR calc Af Amer 73 (L) >90 mL/min   Anion gap 12 5 - 15   No results found.     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 This is a recurrent problem for the patient. Patient will need to follow-up with her doctor.  Will prescribe diflucan, bactrim, and cipro, and prednisone.  Patient seen by and discussed with Dr. Eulis Foster, who agrees with the plan.   Montine Circle, PA-C 10/05/14 2116  Richarda Blade, MD 10/06/14 [phone]

## 2014-10-05 NOTE — ED Notes (Signed)
Pt c/o left ear pain from vasculitis to ear; pt with hx of same from cocaine use

## 2014-10-05 NOTE — ED Notes (Signed)
MD at bedside. 

## 2014-10-05 NOTE — ED Provider Notes (Signed)
[name] is a 54 y.o. female who is here for evaluation of painful ears.    Face-to-face evaluation   History: She complains of left greater than right ear pain for 2 days following ingesting inhaled cocaine.  She previously had this problem, and it was attributed to the drug Levamisole, which is a frequent adulterant of cocaine.  She was admitted to the hospital for treatment.  Several months ago, but did not have long-standing issues with it.   Physical exam: Left pinna is deformed, and moderately red.  There are several darkened areas consistent with early necrosis.  The left pinna is exquisitely tender to touch.  It is also deformed in a similar manner to the left, but is not reddened.  It is also tender, but does not have any signs of redness, or necrosis.  Medical screening examination/treatment/procedure(s) were conducted as a shared visit with non-physician practitioner(s) and myself.  I personally evaluated the patient during the encounter   Richarda Blade, MD 10/06/14 [phone]

## 2014-10-09 ENCOUNTER — Emergency Department (HOSPITAL_COMMUNITY)
Admission: EM | Admit: 2014-10-09 | Discharge: 2014-10-09 | Disposition: A | Discharge status: Other Acute Inpatient Hospital | Payer: Medicaid Other | Attending: Emergency Medicine | Admitting: Emergency Medicine

## 2014-10-09 ENCOUNTER — Emergency Department (HOSPITAL_COMMUNITY): Payer: Medicaid Other

## 2014-10-09 ENCOUNTER — Encounter (HOSPITAL_COMMUNITY): Payer: Self-pay

## 2014-10-09 ENCOUNTER — Inpatient Hospital Stay (HOSPITAL_COMMUNITY)
Admission: EM | Admit: 2014-10-09 | Discharge: 2014-10-17 | DRG: 885 | Disposition: A | Discharge status: Home / Self Care | Payer: Medicaid Other | Source: Intra-hospital | Attending: Psychiatry | Admitting: Psychiatry

## 2014-10-09 ENCOUNTER — Encounter (HOSPITAL_COMMUNITY): Payer: Self-pay | Admitting: Emergency Medicine

## 2014-10-09 DIAGNOSIS — F411 Generalized anxiety disorder: Secondary | ICD-10-CM | POA: Diagnosis present

## 2014-10-09 DIAGNOSIS — G4700 Insomnia, unspecified: Secondary | ICD-10-CM | POA: Diagnosis present

## 2014-10-09 DIAGNOSIS — F1490 Cocaine use, unspecified, uncomplicated: Secondary | ICD-10-CM

## 2014-10-09 DIAGNOSIS — F322 Major depressive disorder, single episode, severe without psychotic features: Secondary | ICD-10-CM

## 2014-10-09 DIAGNOSIS — Z8701 Personal history of pneumonia (recurrent): Secondary | ICD-10-CM | POA: Insufficient documentation

## 2014-10-09 DIAGNOSIS — F1420 Cocaine dependence, uncomplicated: Secondary | ICD-10-CM | POA: Diagnosis present

## 2014-10-09 DIAGNOSIS — R443 Hallucinations, unspecified: Secondary | ICD-10-CM | POA: Diagnosis not present

## 2014-10-09 DIAGNOSIS — Z803 Family history of malignant neoplasm of breast: Secondary | ICD-10-CM

## 2014-10-09 DIAGNOSIS — Z791 Long term (current) use of non-steroidal anti-inflammatories (NSAID): Secondary | ICD-10-CM | POA: Insufficient documentation

## 2014-10-09 DIAGNOSIS — Z609 Problem related to social environment, unspecified: Secondary | ICD-10-CM | POA: Diagnosis present

## 2014-10-09 DIAGNOSIS — M069 Rheumatoid arthritis, unspecified: Secondary | ICD-10-CM | POA: Diagnosis not present

## 2014-10-09 DIAGNOSIS — F332 Major depressive disorder, recurrent severe without psychotic features: Principal | ICD-10-CM | POA: Diagnosis present

## 2014-10-09 DIAGNOSIS — Z3202 Encounter for pregnancy test, result negative: Secondary | ICD-10-CM | POA: Insufficient documentation

## 2014-10-09 DIAGNOSIS — Z559 Problems related to education and literacy, unspecified: Secondary | ICD-10-CM | POA: Diagnosis present

## 2014-10-09 DIAGNOSIS — F17210 Nicotine dependence, cigarettes, uncomplicated: Secondary | ICD-10-CM | POA: Diagnosis present

## 2014-10-09 DIAGNOSIS — G43909 Migraine, unspecified, not intractable, without status migrainosus: Secondary | ICD-10-CM | POA: Diagnosis not present

## 2014-10-09 DIAGNOSIS — F1994 Other psychoactive substance use, unspecified with psychoactive substance-induced mood disorder: Secondary | ICD-10-CM | POA: Insufficient documentation

## 2014-10-09 DIAGNOSIS — F19929 Other psychoactive substance use, unspecified with intoxication, unspecified: Secondary | ICD-10-CM | POA: Insufficient documentation

## 2014-10-09 DIAGNOSIS — R45851 Suicidal ideations: Secondary | ICD-10-CM | POA: Diagnosis present

## 2014-10-09 DIAGNOSIS — Z862 Personal history of diseases of the blood and blood-forming organs and certain disorders involving the immune mechanism: Secondary | ICD-10-CM | POA: Diagnosis not present

## 2014-10-09 DIAGNOSIS — F3289 Other specified depressive episodes: Secondary | ICD-10-CM

## 2014-10-09 DIAGNOSIS — I10 Essential (primary) hypertension: Secondary | ICD-10-CM | POA: Insufficient documentation

## 2014-10-09 DIAGNOSIS — Z792 Long term (current) use of antibiotics: Secondary | ICD-10-CM | POA: Diagnosis not present

## 2014-10-09 DIAGNOSIS — R079 Chest pain, unspecified: Secondary | ICD-10-CM | POA: Diagnosis present

## 2014-10-09 DIAGNOSIS — Z590 Homelessness: Secondary | ICD-10-CM | POA: Diagnosis not present

## 2014-10-09 DIAGNOSIS — Z79899 Other long term (current) drug therapy: Secondary | ICD-10-CM | POA: Diagnosis not present

## 2014-10-09 DIAGNOSIS — Z720 Tobacco use: Secondary | ICD-10-CM | POA: Diagnosis not present

## 2014-10-09 DIAGNOSIS — Z7982 Long term (current) use of aspirin: Secondary | ICD-10-CM | POA: Insufficient documentation

## 2014-10-09 DIAGNOSIS — F1220 Cannabis dependence, uncomplicated: Secondary | ICD-10-CM | POA: Diagnosis present

## 2014-10-09 DIAGNOSIS — F419 Anxiety disorder, unspecified: Secondary | ICD-10-CM

## 2014-10-09 LAB — URINALYSIS, ROUTINE W REFLEX MICROSCOPIC
Glucose, UA: NEGATIVE mg/dL
Hgb urine dipstick: NEGATIVE
Ketones, ur: NEGATIVE mg/dL
NITRITE: NEGATIVE
PH: 7.5 (ref 5.0–8.0)
Protein, ur: NEGATIVE mg/dL
SPECIFIC GRAVITY, URINE: 1.023 (ref 1.005–1.030)
UROBILINOGEN UA: 4 mg/dL — AB (ref 0.0–1.0)

## 2014-10-09 LAB — RAPID URINE DRUG SCREEN, HOSP PERFORMED
Amphetamines: NOT DETECTED
Barbiturates: NOT DETECTED
Benzodiazepines: NOT DETECTED
COCAINE: POSITIVE — AB
Opiates: NOT DETECTED
Tetrahydrocannabinol: POSITIVE — AB

## 2014-10-09 LAB — CBC WITH DIFFERENTIAL/PLATELET
BASOS ABS: 0 10*3/uL (ref 0.0–0.1)
Basophils Relative: 0 % (ref 0–1)
EOS ABS: 0 10*3/uL (ref 0.0–0.7)
EOS PCT: 1 % (ref 0–5)
HCT: 33 % — ABNORMAL LOW (ref 36.0–46.0)
Hemoglobin: 11.3 g/dL — ABNORMAL LOW (ref 12.0–15.0)
Lymphocytes Relative: 35 % (ref 12–46)
Lymphs Abs: 1.1 10*3/uL (ref 0.7–4.0)
MCH: 29.2 pg (ref 26.0–34.0)
MCHC: 34.2 g/dL (ref 30.0–36.0)
MCV: 85.3 fL (ref 78.0–100.0)
Monocytes Absolute: 0.2 10*3/uL (ref 0.1–1.0)
Monocytes Relative: 5 % (ref 3–12)
Neutro Abs: 1.9 10*3/uL (ref 1.7–7.7)
Neutrophils Relative %: 59 % (ref 43–77)
Platelets: 262 10*3/uL (ref 150–400)
RBC: 3.87 MIL/uL (ref 3.87–5.11)
RDW: 13.8 % (ref 11.5–15.5)
WBC: 3.2 10*3/uL — AB (ref 4.0–10.5)

## 2014-10-09 LAB — I-STAT CHEM 8, ED
BUN: 13 mg/dL (ref 6–23)
CHLORIDE: 102 meq/L (ref 96–112)
Calcium, Ion: 1.2 mmol/L (ref 1.12–1.23)
Creatinine, Ser: 0.8 mg/dL (ref 0.50–1.10)
GLUCOSE: 99 mg/dL (ref 70–99)
HCT: 33 % — ABNORMAL LOW (ref 36.0–46.0)
Hemoglobin: 11.2 g/dL — ABNORMAL LOW (ref 12.0–15.0)
POTASSIUM: 3.6 mmol/L (ref 3.5–5.1)
Sodium: 141 mmol/L (ref 135–145)
TCO2: 23 mmol/L (ref 0–100)

## 2014-10-09 LAB — URINE MICROSCOPIC-ADD ON

## 2014-10-09 LAB — PREGNANCY, URINE: Preg Test, Ur: NEGATIVE

## 2014-10-09 LAB — ETHANOL: Alcohol, Ethyl (B): <5 mg/dL (ref 0–9)

## 2014-10-09 LAB — I-STAT TROPONIN, ED: TROPONIN I, POC: 0 ng/mL (ref 0.00–0.08)

## 2014-10-09 MED ORDER — LISINOPRIL 10 MG PO TABS
10.0000 mg | ORAL_TABLET | Freq: Every day | ORAL | Status: DC
  Administered 2014-10-09: 10 mg via ORAL
  Filled 2014-10-09: qty 1

## 2014-10-09 MED ORDER — HYDROXYZINE HCL 25 MG PO TABS
25.0000 mg | ORAL_TABLET | Freq: Four times a day (QID) | ORAL | Status: DC | PRN
  Administered 2014-10-09 – 2014-10-14 (×6): 25 mg via ORAL
  Filled 2014-10-09 (×6): qty 1

## 2014-10-09 MED ORDER — MELOXICAM 15 MG PO TABS
15.0000 mg | ORAL_TABLET | Freq: Every day | ORAL | Status: DC
  Administered 2014-10-09: 15 mg via ORAL
  Filled 2014-10-09: qty 1

## 2014-10-09 MED ORDER — BUPROPION HCL 100 MG PO TABS
100.0000 mg | ORAL_TABLET | Freq: Every day | ORAL | Status: DC
Start: 2014-10-10 — End: 2014-10-12
  Administered 2014-10-10 – 2014-10-12 (×3): 100 mg via ORAL
  Filled 2014-10-09 (×6): qty 1

## 2014-10-09 MED ORDER — MAGNESIUM HYDROXIDE 400 MG/5ML PO SUSP: 30.0000 mL | Freq: Every day | ORAL | Status: DC | PRN

## 2014-10-09 MED ORDER — ALUM & MAG HYDROXIDE-SIMETH 200-200-20 MG/5ML PO SUSP: 30.0000 mL | ORAL | Status: DC | PRN

## 2014-10-09 MED ORDER — TRAZODONE HCL 50 MG PO TABS: 50.0000 mg | ORAL_TABLET | Freq: Every day | ORAL | Status: DC

## 2014-10-09 MED ORDER — PREDNISONE 20 MG PO TABS
40.0000 mg | ORAL_TABLET | Freq: Every day | ORAL | Status: DC
  Administered 2014-10-09: 40 mg via ORAL
  Filled 2014-10-09: qty 2

## 2014-10-09 MED ORDER — IBUPROFEN 200 MG PO TABS: 400.0000 mg | ORAL_TABLET | Freq: Four times a day (QID) | ORAL | Status: DC | PRN

## 2014-10-09 MED ORDER — IBUPROFEN 600 MG PO TABS
600.0000 mg | ORAL_TABLET | Freq: Four times a day (QID) | ORAL | Status: DC | PRN
  Administered 2014-10-13 – 2014-10-16 (×3): 600 mg via ORAL
  Filled 2014-10-09 (×3): qty 1

## 2014-10-09 MED ORDER — FLUOXETINE HCL 10 MG PO CAPS
10.0000 mg | ORAL_CAPSULE | Freq: Every day | ORAL | Status: DC
  Administered 2014-10-09: 10 mg via ORAL
  Filled 2014-10-09: qty 1

## 2014-10-09 MED ORDER — FLUCONAZOLE 150 MG PO TABS
150.0000 mg | ORAL_TABLET | ORAL | Status: DC
  Administered 2014-10-09: 150 mg via ORAL
  Filled 2014-10-09: qty 1

## 2014-10-09 MED ORDER — ENSURE COMPLETE PO LIQD
237.0000 mL | Freq: Two times a day (BID) | ORAL | Status: DC
  Administered 2014-10-10: 237 mL via ORAL

## 2014-10-09 MED ORDER — TRAZODONE HCL 50 MG PO TABS
50.0000 mg | ORAL_TABLET | Freq: Every evening | ORAL | Status: DC | PRN
  Administered 2014-10-09 – 2014-10-16 (×10): 50 mg via ORAL
  Filled 2014-10-09 (×14): qty 1
  Filled 2014-10-09: qty 28
  Filled 2014-10-09 (×3): qty 1
  Filled 2014-10-09: qty 28
  Filled 2014-10-09 (×3): qty 1

## 2014-10-09 MED ORDER — BACITRACIN-NEOMYCIN-POLYMYXIN 400-5-5000 EX OINT
TOPICAL_OINTMENT | CUTANEOUS | Status: DC | PRN
Start: 2014-10-09 — End: 2014-10-17
  Administered 2014-10-12 – 2014-10-15 (×4): 1 via TOPICAL
  Administered 2014-10-15: 08:00:00 via TOPICAL
  Administered 2014-10-16 – 2014-10-17 (×2): 1 via TOPICAL
  Filled 2014-10-09 (×13): qty 1
  Filled 2014-10-09: qty 2

## 2014-10-09 MED ORDER — ASPIRIN 325 MG PO TABS
325.0000 mg | ORAL_TABLET | Freq: Every day | ORAL | Status: DC
  Administered 2014-10-10 – 2014-10-17 (×8): 325 mg via ORAL
  Filled 2014-10-09 (×10): qty 1

## 2014-10-09 MED ORDER — CIPROFLOXACIN HCL 500 MG PO TABS
500.0000 mg | ORAL_TABLET | Freq: Two times a day (BID) | ORAL | Status: DC
  Administered 2014-10-09: 500 mg via ORAL
  Filled 2014-10-09: qty 1

## 2014-10-09 MED ORDER — SULFAMETHOXAZOLE-TRIMETHOPRIM 800-160 MG PO TABS
1.0000 | ORAL_TABLET | Freq: Two times a day (BID) | ORAL | Status: DC
  Administered 2014-10-09: 1 via ORAL
  Filled 2014-10-09: qty 1

## 2014-10-09 MED ORDER — ASPIRIN 325 MG PO TABS
325.0000 mg | ORAL_TABLET | Freq: Every day | ORAL | Status: DC
  Administered 2014-10-09: 325 mg via ORAL
  Filled 2014-10-09: qty 1

## 2014-10-09 NOTE — ED Notes (Signed)
Awake. Verbally responsive. A/O x4. Resp even and unlabored. No audible adventitious breath sounds noted. ABC's intact. Pt given sandwich to eat and drink.

## 2014-10-09 NOTE — ED Notes (Signed)
Patient transported to X-ray and returned without distress noted.

## 2014-10-09 NOTE — ED Notes (Signed)
Resting quietly with eye closed. Easily arousable. Verbally responsive. Resp even and unlabored. ABC's intact. SR on monitor at 81bpm. NAD noted.

## 2014-10-09 NOTE — ED Provider Notes (Addendum)
CSN: [id number]     Arrival date & time 10/09/14  0234 History   First MD Initiated Contact with Patient 10/09/14 0240     Chief Complaint  Patient presents with  . Chest Pain     (Consider location/radiation/quality/duration/timing/severity/associated sxs/prior Treatment) HPI [name] is a 54 y.o. female with past medical history of vasculitis, hypertension, cocaine abuse presenting today with multiple complaints.  Patient states she has left-sided chest pain which is sharp in nature. This is in the setting of viral URI like symptoms. She states is worse with a cough. She denies any shortness of breath, vomiting, diaphoresis. Patient has no radiation or exertional component. She also complains of left ear pain. She was recently placed on antibiotics but has not taken her first dose yet. She denies any pain inside her ear, only in the soft tissue on the outside. Patient's final complaint is depression and suicidal ideation. She states she has not seen her family in many years and she has thought of cutting herself. She states that she hears voices that tell her to do horrible things. Patient has no further complaints.  10 Systems reviewed and are negative for acute change except as noted in the HPI.     Past Medical History  Diagnosis Date  . 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 Followed by Dr. Louanne Skye  . Hypertension   . Cocaine abuse     ongoing with resultant vaculitis.  . Depression   . Normocytic anemia     BL Hgb 9.8-12. Last anemia panel 04/2010 - showing Fe 19, ferritin 101.  Pt on monthly B12 injections  . 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 CAP (community acquired pneumonia) 03/2014 X 2  . Daily headache   . Migraines     "probably 5-6/yr" (04/21/2014)  . Rheumatoid arthritis(714.0)     patient reported  . Inflammatory arthritis    Past Surgical History  Procedure Laterality Date  . Skin biopsy Bilateral 04/2010    shin nodules   . Irrigation and debridement abscess Bilateral 09/26/2013    Procedure: DEBRIDEMENT ULCERS BILATERAL THIGHS;  Surgeon: Gwenyth Ober, MD;  Location: [facility];  Service: General;  Laterality: Bilateral;  . Hernia repair      "stomach"   Family History  Problem Relation Age of Onset  . Breast cancer Mother     Breast cancer  . Alcohol abuse Mother   . Colon cancer Maternal Aunt 61  . Alcohol abuse Father    History  Substance Use Topics  . Smoking status: Current Every Day Smoker -- 0.30 packs/day for 34 years    Types: Cigarettes  . Smokeless tobacco: Never Used     Comment: PATIENT HAS CUT BACK TO 5 CIGARETES A DAY  . Alcohol Use: No   OB History    No data available     Review of Systems    Allergies  Acetaminophen  Home Medications   Prior to Admission medications   Medication Sig Start Date End Date Taking? Authorizing Provider  aspirin 325 MG tablet Take 325 mg by mouth daily.    Historical Provider, MD  buPROPion (WELLBUTRIN) 100 MG tablet Take 1 tablet (100 mg total) by mouth daily before breakfast. For depression 05/08/14   Benjamine Mola, FNP  ciprofloxacin (CIPRO) 500 MG tablet Take 1 tablet (500 mg total) by mouth 2 (two) times daily. 10/05/14   Montine Circle, PA-C  fluconazole (DIFLUCAN) 150 MG tablet Take 1 tablet (150 mg total) by mouth 1 day or 1 dose. 10/05/14   Montine Circle, PA-C  ibuprofen (ADVIL,MOTRIN) 200 MG tablet Take 200 mg by mouth every 6 (six) hours as needed.    Historical Provider, MD  meloxicam (MOBIC) 15 MG tablet Take 1 tablet (15 mg total) by mouth daily. 09/24/14   Kaitlyn Szekalski, PA-C  oxyCODONE (ROXICODONE) 5 MG immediate release tablet Take 1 tablet (5 mg total) by mouth every 6 (six) hours as needed for severe pain. 10/05/14   Montine Circle, PA-C  permethrin (ELIMITE) 5 % cream Apply to affected area once 09/24/14   Alvina Chou, PA-C  predniSONE (DELTASONE) 20 MG tablet Take 2 tablets (40 mg total) by mouth daily. Take 40 mg by  mouth daily for 3 days, then 49m by mouth daily for 3 days, then 182mdaily for 3 days 10/05/14   RoMontine CirclePA-C  silver sulfADIAZINE (SILVADENE) 1 % cream Apply 1 application topically 2 (two) times daily. To affected skin areas 05/08/14   JoBenjamine MolaFNP  sulfamethoxazole-trimethoprim (SEPTRA DS) 800-160 MG per tablet Take 1 tablet by mouth every 12 (twelve) hours. 10/05/14   RoMontine CirclePA-C   BP 185/96 mmHg  Pulse 87  Temp(Src) 98.1 F (36.7 C)  Resp 18  Ht 5' 1"  (1.549 m)  Wt 96 lb (43.545 kg)  BMI 18.15 kg/m2  SpO2 100% Physical Exam  Constitutional: She is oriented to person, place, and time. She appears well-developed. No distress.  HENT:  Head: Normocephalic and atraumatic.  Nose: Nose normal.  Mouth/Throat: Oropharynx is clear and moist. No oropharyngeal exudate.  Left external ear with vasculitic changes. It is tender to palpation as well. There is mild erythema.  Bilateral ear canals are normal, TMs are normal.  Eyes: Conjunctivae and EOM are normal. Pupils are equal, round, and reactive to light. No scleral icterus.  Neck: Normal range of motion. Neck supple. No JVD present. No tracheal deviation present. No thyromegaly present.  Cardiovascular: Normal rate, regular rhythm and normal heart sounds.  Exam reveals no gallop and no friction rub.   No murmur heard. Pulmonary/Chest: Effort normal and breath sounds normal. No respiratory distress. She has no wheezes. She exhibits no tenderness.  Abdominal: Soft. Bowel sounds are normal. She exhibits no distension and no mass. There is no tenderness. There is no rebound and no guarding.  Musculoskeletal: Normal range of motion. She exhibits no edema or tenderness.  Lymphadenopathy:    She has no cervical adenopathy.  Neurological: She is alert and oriented to person, place, and time. No cranial nerve deficit. She exhibits normal muscle tone.  Skin: Skin is warm and dry. No rash noted. She is not diaphoretic. No  erythema. No pallor.  Psychiatric:  Patient crying in the room, she appears depressed.  Nursing note and vitals reviewed.   ED Course  Procedures (including critical care time) Labs Review Labs Reviewed  CBC WITH DIFFERENTIAL - Abnormal; Notable for the following:    WBC 3.2 (*)    Hemoglobin 11.3 (*)    HCT 33.0 (*)    All other components within normal limits  URINALYSIS, ROUTINE W REFLEX MICROSCOPIC - Abnormal; Notable for the following:    APPearance TURBID (*)    Bilirubin Urine SMALL (*)    Urobilinogen, UA 4.0 (*)    Leukocytes, UA SMALL (*)    All other components within normal limits  URINE MICROSCOPIC-ADD ON - Abnormal; Notable for  the following:    Squamous Epithelial / LPF MANY (*)    Bacteria, UA MANY (*)    All other components within normal limits  I-STAT CHEM 8, ED - Abnormal; Notable for the following:    Hemoglobin 11.2 (*)    HCT 33.0 (*)    All other components within normal limits  ETHANOL  PREGNANCY, URINE  I-STAT TROPOININ, ED    Imaging Review Dg Chest 2 View  10/09/2014   CLINICAL DATA:  Upper chest pain for 24 hr, worsening.  EXAM: CHEST  2 VIEW  COMPARISON:  05/07/2014  FINDINGS: Normal heart size and pulmonary vascularity. Pulmonary hyperinflation. Linear scarring in the right upper lung. No focal airspace disease or consolidation. No blunting of costophrenic angles. No pneumothorax. Prominent nodular opacities over the mid lungs consistent with prominent nipple shadows. No change since prior study.  IMPRESSION: No active cardiopulmonary disease.   Electronically Signed   By: Lucienne Capers M.D.   On: 10/09/2014 03:19     EKG Interpretation   Date/Time:  Tuesday October 09 2014 05:22:43 EST Ventricular Rate:  83 PR Interval:  133 QRS Duration: 86 QT Interval:  433 QTC Calculation: 509 R Axis:   86 Text Interpretation:  Sinus rhythm Biatrial enlargement Left ventricular  hypertrophy Abnormal T, consider ischemia, anterior leads Confirmed  by  Glynn Octave [phone]) on 10/09/2014 5:27:44 AM      MDM   Final diagnoses:  Chest pain    Patient presents emergency department for multiple complaints. Her chest pain is not consistent with ACS history. Chest x-ray does not show any pneumonia. This is likely chest pain from her coughing and viral URI like symptoms. Patient is a peripherally treated with ciprofloxacin and Bactrim for skin infection of external ear. She is advised to continue medications. Psych consult was obtained for depression. They state she meets inpatient criteria for suicidal ideation.  Urinalysis reveals small leukocytes, many squamous epithelial, many bacteria. This is a dirty sample, she also is denying any urinary complaints. I'm not inclined to treat this for infection.  EKG was obtained without and order given. This shows a new T wave inversion in V3, troponin was sent and is negative. Again patient's history is not consistent with ACS.  She was evaluated by TTS and they rec for inpatient admission.    Everlene Balls, MD 10/09/14 4718  Everlene Balls, MD 10/09/14 1431

## 2014-10-09 NOTE — ED Notes (Signed)
Pt out of ED with [facility] transport to take to [facility]. Pt ambulatory and w/o any s/s of distress.

## 2014-10-09 NOTE — BH Assessment (Signed)
Spoke with Judson Roch from [facility] whom reports that patient's information will be reviewed by their Doctor and she will contact TTS with a decision.   Shaune Pollack, MS, [place] Assessment Counselor

## 2014-10-09 NOTE — ED Notes (Signed)
Santiago Glad, mental health specialist called and reported that needs in-pt BH and need to hold until find placement.

## 2014-10-09 NOTE — ED Notes (Signed)
[facility] transport called to transport pt to [facility]. Awaiting arrival.

## 2014-10-09 NOTE — BH Assessment (Signed)
Per Dr. Claudine Mouton pt is not having any cardiac problems. She will need to continue to take antibiotic for burn on ear. Labs are pending.   Lear Ng, [facility] Triage Specialist 10/09/2014 3:32 AM

## 2014-10-09 NOTE — BHH Counselor (Addendum)
Pt has been accepted to [facility] 306-2. Pt can go over after 8:30 pm. Accepting physician is Dr. Shea Evans.   Support paperwork completed and given to pt's nurse at 19:45. TTS informed pt's attending physician, Dr. Sabra Heck.    Ramond Dial, [facility] Triage Specialist

## 2014-10-09 NOTE — BH Assessment (Signed)
Inpt recommended. [facility] at capacity. TTS seeking placement. Sent referrals to: [facility]  Cesar Chavez, [place] Triage Specialist 10/09/2014 5:01 AM

## 2014-10-09 NOTE — ED Notes (Signed)
Resting quietly with eye closed. Easily arousable. Verbally responsive. Resp even and unlabored. ABC's intact. SR on monitor. NAD noted.

## 2014-10-09 NOTE — ED Notes (Signed)
Awake. Verbally responsive. A/O x4. Resp even and unlabored. No audible adventitious breath sounds noted. ABC's intact. Pt crying and reported that she wanted to get off of cocaine and she has not seen her family in 49 yrs. Pt denies plan or attempting SI.

## 2014-10-09 NOTE — Progress Notes (Signed)
  CARE MANAGEMENT ED NOTE 10/09/2014  Patient:  [name], [name]   Account Number:  [id number]  Date Initiated:  10/09/2014  Documentation initiated by:  Jackelyn Poling  Subjective/Objective Assessment:   54 yr old medicaid [place] access [place] resident c/o chest pain SI with plan to slit wrists hallucinations to kill herself off meds for 4 months pcp listed as [facility]     Subjective/Objective Assessment Detail:   SBP 185, 166, 165  Dx cocaine use disorder, severe r/o opiate use disorder, Major depression disorder, severe with psychotic features, r/o schizoaffective disorders unspecified anxiety disorder  ED BH progression meeting for inpatient admission  EDP who evaluated pt on 08/10/15 am states peripherally treated with ciprofloxacin and Bactrim for skin infection of external ear. She is advised to continue medications     Action/Plan:   Noted SBP elevation, pmh htn no BP med Spoke with [facility] about HTN med & cipro & bactrim for skin infection of external ear See EDP orders   Action/Plan Detail:   Anticipated DC Date:       Status Recommendation to Physician:   Result of Recommendation:    Other ED Services  Consult Working Ken Caryl  Other  Outpatient Services - Pt will follow up    Choice offered to / List presented to:            Status of service:  Completed, signed off  ED Comments:   ED Comments Detail:

## 2014-10-09 NOTE — ED Notes (Signed)
Mental health at bedside to evaluate.

## 2014-10-09 NOTE — ED Notes (Addendum)
Removed personal items: pants, pair of socks, shirt, jacket, coat, shoes, belt, white cap, 93 cents,cell phone,lighter, marker, and 2 bottles of medication of Cipro and Bactrim. Personal items placed in belongings bag x2 and kept at NS.

## 2014-10-09 NOTE — ED Provider Notes (Signed)
Pt accepted at [facility] by Dr. Nolon Lennert, MD 10/09/14 [phone]

## 2014-10-09 NOTE — ED Notes (Signed)
Dr. Oni at bedside. 

## 2014-10-09 NOTE — BH Assessment (Signed)
Writer informed TTS Izora Gala of the consult.

## 2014-10-09 NOTE — BH Assessment (Signed)
Attempted to speak with Dr. Claudine Mouton prior to assessment. He requests call back as he is in pt room at this time. In order to avoid delay in pt care will initiate assessment and then obtain collateral from Dr. Claudine Mouton.    Lear Ng, [facility] Triage Specialist 10/09/2014 3:09 AM

## 2014-10-09 NOTE — BH Assessment (Signed)
Spoke with Judson Roch from [facility] who reports that they are willing to accept patient with the condition that she can provide her own transportation back from their facility. Spoke with pt's ED nurse Ramond Marrow who reports that she had a conversation with patient regarding transportation and patient reports that she does not have a way to get back from their hospital.  Consulted with Judson Roch at [facility] who reports that patient can't be accepted as they do not provide transportation back to patient's county of residence.   Shaune Pollack, MS, [place] Assessment Counselor

## 2014-10-09 NOTE — ED Notes (Signed)
Patient presents from home via EMS for chest wall pain, non productive cough x1 day, general malaise, currently on antibiotic tx.   VS: 180/90Bp, 90Hr, 18Resp.

## 2014-10-09 NOTE — ED Notes (Signed)
Bed: SC38 Expected date:  Expected time:  Means of arrival:  Comments: EMS

## 2014-10-09 NOTE — BH Assessment (Signed)
Per Patriciaann Clan, PA pt meets inpt criteria. No [facility] beds available. TTS to seek placement.   Informed Dr. Claudine Mouton of recommendations and he is in agreement.   Informed RN of plan to seek placement and of pt's reports of command hallucinations.   Pt is in agreement with plan to seek placement.   Lear Ng, [facility] Triage Specialist 10/09/2014 4:01 AM

## 2014-10-09 NOTE — Consult Note (Signed)
[facility] Consult   Reason for Consult:  Depression, substance abuse Referring Physician:  EDP ARANZA [name] is an 54 y.o. female. Total Time spent with patient: 1 hour  Assessment: DSM5 304.20 Cocaine Use Disorder, Severe Rule out Opiate Use Disorder 296.24 Major Depressive Disorder, severe with psychotic features R/O schizoaffective disorder 300.00 Unspecified Anxiety Disorder    Past Medical History  Diagnosis Date  . 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 Followed by Dr. Louanne Skye  . Hypertension   . Cocaine abuse     ongoing with resultant vaculitis.  . Depression   . Normocytic anemia     BL Hgb 9.8-12. Last anemia panel 04/2010 - showing Fe 19, ferritin 101.  Pt on monthly B12 injections  . 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 CAP (community acquired pneumonia) 03/2014 X 2  . Daily headache   . Migraines     "probably 5-6/yr" (04/21/2014)  . Rheumatoid arthritis(714.0)     patient reported  . Inflammatory arthritis      Plan:  Recommend psychiatric Inpatient admission when medically cleared.  Subjective:   [name] is a 54 y.o. female patient admitted with Major depression, Anxiety disorder,   HPI: Caucasian female, 54 years old was assessed in her room for severe depression.  Patient states she is homeless and have been depressed for so long.  Patient states that she has been using illicit drugs which she buys off the street.  Patient stated that she spent some time in [place] between 1997 to 2008  and lost contact with his family members.  She stated that each she remembers her family she gets more depressed and angry.  Patient stated that she has been using Cocaine and and Marijuana but denied drinking Alcohol.  Patient reports that she feels hopeless, helpless and worthless.  Patient also stated that once in a while she spends time with her friends.  Patient reports she manages to  sleep when she has a place to sleep and that her appetite varies.  Patient is asking for treatment for depression and detox.  We have accepted patient for admission and will transfer when bed is available.  Patient stated she is not able to contract for safety at this time.  She denies HI/AVH.  HPI Elements:   Location:  Depression, Anxiety, Cocaine abuse, Marijuana abuse. Quality:  Anxiety, insomnia, homelessness, loss appetite, , feeling hopekless, helpless and worthless.. Severity:  severe. Timing:  acute, on going. Duration:  chronic, on going. Context:  Seeking treatment..  Past Psychiatric History: Past Medical History  Diagnosis Date  . 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 Followed by Dr. Louanne Skye  . Hypertension   . Cocaine abuse     ongoing with resultant vaculitis.  . Depression   . Normocytic anemia     BL Hgb 9.8-12. Last anemia panel 04/2010 - showing Fe 19, ferritin 101.  Pt on monthly B12 injections  . 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 CAP (community acquired pneumonia) 03/2014 X 2  . Daily headache   . Migraines     "probably 5-6/yr" (04/21/2014)  . Rheumatoid arthritis(714.0)     patient reported  . Inflammatory arthritis     reports that she has been smoking Cigarettes.  She has a 10.2 pack-year smoking history. She has never used smokeless tobacco. She reports that she uses  illicit drugs ("Crack" cocaine, Cocaine, and Marijuana). She reports that she does not drink alcohol. Family History  Problem Relation Age of Onset  . Breast cancer Mother     Breast cancer  . Alcohol abuse Mother   . Colon cancer Maternal Aunt 74  . Alcohol abuse Father    Family History Substance Abuse: Yes, Describe: ("alcoholic family") Family Supports: No Living Arrangements: Other (Comment) (staying with friends) Can pt return to current living arrangement?: Yes Abuse/Neglect [facility]) Physical Abuse: Denies (Reports she is not sure if she has  been a victim of abuse due to her drug use) Verbal Abuse: Denies Sexual Abuse: Denies Allergies:   Allergies  Allergen Reactions  . Acetaminophen Swelling    REACTION: eyelid swelling    ACT Assessment Complete:  Yes:    Educational Status    Risk to Self: Risk to self with the past 6 months Suicidal Ideation: Yes-Currently Present Suicidal Intent: Yes-Currently Present Is patient at risk for suicide?: Yes Suicidal Plan?: Yes-Currently Present Specify Current Suicidal Plan: slit wrists with knife Access to Means: Yes Specify Access to Suicidal Means: knife What has been your use of drugs/alcohol within the last 12 months?: Pt has been abuse crack cocaine since age 20 Previous Attempts/Gestures: No How many times?: 0 Other Self Harm Risks: hurts self while using drugs Triggers for Past Attempts: None known Intentional Self Injurious Behavior: Burning (reports has not done intentionally in years) Comment - Self Injurious Behavior: burned herself in past intentionally, recently burned ear accidentally due to drug use Family Suicide History: Yes (uncle attempted) Recent stressful life event(s): Other (Comment) (staying with friends, health problems, SA) Persecutory voices/beliefs?: No Depression: Yes Depression Symptoms: Despondent, Insomnia, Tearfulness, Isolating, Fatigue, Guilt, Loss of interest in usual pleasures, Feeling worthless/self pity Substance abuse history and/or treatment for substance abuse?: Yes Suicide prevention information given to non-admitted patients: Yes  Risk to Others: Risk to Others within the past 6 months Homicidal Ideation: No-Not Currently/Within Last 6 Months Thoughts of Harm to Others: No-Not Currently Present/Within Last 6 Months Current Homicidal Intent: No Current Homicidal Plan: No Access to Homicidal Means: No Identified Victim: ideation at times no specific victim or plan  History of harm to others?: No Assessment of Violence: None  Noted Violent Behavior Description: none Does patient have access to weapons?: No Criminal Charges Pending?: No Does patient have a court date: No  Abuse: Abuse/Neglect Assessment (Assessment to be complete while patient is alone) Physical Abuse: Denies (Reports she is not sure if she has been a victim of abuse due to her drug use) Verbal Abuse: Denies Sexual Abuse: Denies Exploitation of patient/patient's resources: Denies Self-Neglect: Denies  Prior Inpatient Therapy: Prior Inpatient Therapy Prior Inpatient Therapy: Yes Prior Therapy Dates: 8/15, 4/15, 1/15, 11/14, 9/14, 8/14 Prior Therapy Facilty/Provider(s): [facility] Reason for Treatment: Major Depressive Disorder, SA  Prior Outpatient Therapy: Prior Outpatient Therapy Prior Outpatient Therapy: Yes Prior Therapy Dates: current Prior Therapy Facilty/Provider(s): [facility]  Reason for Treatment: SA, Depression  Additional Information: Additional Information 1:1 In Past 12 Months?: No CIRT Risk: No Elopement Risk: No Does patient have medical clearance?: No   Objective: Blood pressure 145/79, pulse 84, temperature 99 F (37.2 C), temperature source Oral, resp. rate 20, height 5' 1"  (1.549 m), weight 43.545 kg (96 lb), SpO2 97 %.Body mass index is 18.15 kg/(m^2). Results for orders placed or performed during the hospital encounter of 10/09/14 (from the past 72 hour(s))  CBC with Differential     Status: Abnormal  Collection Time: 10/09/14  3:36 AM  Result Value Ref Range   WBC 3.2 (L) 4.0 - 10.5 K/uL   RBC 3.87 3.87 - 5.11 MIL/uL   Hemoglobin 11.3 (L) 12.0 - 15.0 g/dL   HCT 33.0 (L) 36.0 - 46.0 %   MCV 85.3 78.0 - 100.0 fL   MCH 29.2 26.0 - 34.0 pg   MCHC 34.2 30.0 - 36.0 g/dL   RDW 13.8 11.5 - 15.5 %   Platelets 262 150 - 400 K/uL   Neutrophils Relative % 59 43 - 77 %   Neutro Abs 1.9 1.7 - 7.7 K/uL   Lymphocytes Relative 35 12 - 46 %   Lymphs Abs 1.1 0.7 - 4.0 K/uL   Monocytes Relative 5 3 - 12 %   Monocytes  Absolute 0.2 0.1 - 1.0 K/uL   Eosinophils Relative 1 0 - 5 %   Eosinophils Absolute 0.0 0.0 - 0.7 K/uL   Basophils Relative 0 0 - 1 %   Basophils Absolute 0.0 0.0 - 0.1 K/uL  Ethanol     Status: None   Collection Time: 10/09/14  3:36 AM  Result Value Ref Range   Alcohol, Ethyl (B) <5 0 - 9 mg/dL    Comment:        LOWEST DETECTABLE LIMIT FOR SERUM ALCOHOL IS 11 mg/dL FOR MEDICAL PURPOSES ONLY   I-stat chem 8, ed     Status: Abnormal   Collection Time: 10/09/14  3:44 AM  Result Value Ref Range   Sodium 141 135 - 145 mmol/L   Potassium 3.6 3.5 - 5.1 mmol/L   Chloride 102 96 - 112 mEq/L   BUN 13 6 - 23 mg/dL   Creatinine, Ser 0.80 0.50 - 1.10 mg/dL   Glucose, Bld 99 70 - 99 mg/dL   Calcium, Ion 1.20 1.12 - 1.23 mmol/L   TCO2 23 0 - 100 mmol/L   Hemoglobin 11.2 (L) 12.0 - 15.0 g/dL   HCT 33.0 (L) 36.0 - 46.0 %  Urinalysis, Routine w reflex microscopic     Status: Abnormal   Collection Time: 10/09/14  5:20 AM  Result Value Ref Range   Color, Urine YELLOW YELLOW   APPearance TURBID (A) CLEAR   Specific Gravity, Urine 1.023 1.005 - 1.030   pH 7.5 5.0 - 8.0   Glucose, UA NEGATIVE NEGATIVE mg/dL   Hgb urine dipstick NEGATIVE NEGATIVE   Bilirubin Urine SMALL (A) NEGATIVE   Ketones, ur NEGATIVE NEGATIVE mg/dL   Protein, ur NEGATIVE NEGATIVE mg/dL   Urobilinogen, UA 4.0 (H) 0.0 - 1.0 mg/dL   Nitrite NEGATIVE NEGATIVE   Leukocytes, UA SMALL (A) NEGATIVE  Pregnancy, urine     Status: None   Collection Time: 10/09/14  5:20 AM  Result Value Ref Range   Preg Test, Ur NEGATIVE NEGATIVE    Comment:        THE SENSITIVITY OF THIS METHODOLOGY IS >20 mIU/mL.   Urine microscopic-add on     Status: Abnormal   Collection Time: 10/09/14  5:20 AM  Result Value Ref Range   Squamous Epithelial / LPF MANY (A) RARE   WBC, UA 3-6 <3 WBC/hpf   Bacteria, UA MANY (A) RARE  I-stat troponin, ED     Status: None   Collection Time: 10/09/14  5:51 AM  Result Value Ref Range   Troponin i, poc 0.00  0.00 - 0.08 ng/mL   Comment 3            Comment: Due to  the release kinetics of cTnI, a negative result within the first hours of the onset of symptoms does not rule out myocardial infarction with certainty. If myocardial infarction is still suspected, repeat the test at appropriate intervals.    Labs are reviewed and are pertinent for UA, H&LOW, Patient with hx of Anemia   Current Facility-Administered Medications  Medication Dose Route Frequency Provider Last Rate Last Dose  . FLUoxetine (PROZAC) capsule 10 mg  10 mg Oral Daily Keiden Deskin   10 mg at 10/09/14 1205  . ibuprofen (ADVIL,MOTRIN) tablet 400 mg  400 mg Oral Q6H PRN Ryen Rhames      . lisinopril (PRINIVIL,ZESTRIL) tablet 10 mg  10 mg Oral Daily Jasper Riling. Pickering, MD   10 mg at 10/09/14 1224  . meloxicam (MOBIC) tablet 15 mg  15 mg Oral Daily Jorene Kaylor   15 mg at 10/09/14 1205  . traZODone (DESYREL) tablet 50 mg  50 mg Oral QHS Sakinah Rosamond       Current Outpatient Prescriptions  Medication Sig Dispense Refill  . aspirin 325 MG tablet Take 325 mg by mouth daily.    Marland Kitchen buPROPion (WELLBUTRIN) 100 MG tablet Take 1 tablet (100 mg total) by mouth daily before breakfast. For depression 30 tablet 0  . ciprofloxacin (CIPRO) 500 MG tablet Take 1 tablet (500 mg total) by mouth 2 (two) times daily. 14 tablet 0  . fluconazole (DIFLUCAN) 150 MG tablet Take 1 tablet (150 mg total) by mouth 1 day or 1 dose. 7 tablet 0  . ibuprofen (ADVIL,MOTRIN) 200 MG tablet Take 200 mg by mouth every 6 (six) hours as needed for moderate pain.     . meloxicam (MOBIC) 15 MG tablet Take 1 tablet (15 mg total) by mouth daily. 20 tablet 0  . oxyCODONE (ROXICODONE) 5 MG immediate release tablet Take 1 tablet (5 mg total) by mouth every 6 (six) hours as needed for severe pain. 15 tablet 0  . predniSONE (DELTASONE) 20 MG tablet Take 2 tablets (40 mg total) by mouth daily. Take 40 mg by mouth daily for 3 days, then 66m by mouth daily for 3 days,  then 181mdaily for 3 days 12 tablet 0  . sulfamethoxazole-trimethoprim (SEPTRA DS) 800-160 MG per tablet Take 1 tablet by mouth every 12 (twelve) hours. 20 tablet 0  . permethrin (ELIMITE) 5 % cream Apply to affected area once 60 g 0  . silver sulfADIAZINE (SILVADENE) 1 % cream Apply 1 application topically 2 (two) times daily. To affected skin areas 50 g 0    Psychiatric Specialty Exam:     Blood pressure 145/79, pulse 84, temperature 99 F (37.2 C), temperature source Oral, resp. rate 20, height 5' 1"  (1.549 m), weight 43.545 kg (96 lb), SpO2 97 %.Body mass index is 18.15 kg/(m^2).  General Appearance: Casual and Disheveled  Eye Contact::  Good  Speech:  Clear and Coherent and Normal Rate  Volume:  Normal  Mood:  Angry, Anxious, Depressed, Hopeless, Worthless and Helpless  Affect:  Congruent, Depressed and Flat  Thought Process:  Coherent, Goal Directed and Intact  Orientation:  Full (Time, Place, and Person)  Thought Content:  Suicidal, hopleless  Suicidal Thoughts:  Yes.  with intent/plan  Homicidal Thoughts:  No  Memory:  Immediate;   Good Recent;   Good Remote;   Good  Judgement:  Fair  Insight:  Shallow  Psychomotor Activity:  Tremor  Concentration:  Good  Recall:  NA  Fund of Knowledge:Good  Language:  Good  Akathisia:  NA  Handed:  Right  AIMS (if indicated):     Assets:  Desire for Improvement Housing  Sleep:      Musculoskeletal: Strength & Muscle Tone: within normal limits Gait & Station: normal Patient leans: N/A  Treatment Plan Summary: Daily contact with patient to assess and evaluate symptoms and progress in treatment Medication management  Delfin Gant  PMHNP-BC 10/09/2014 1:06 PM  Patient seen, evaluated and I agree with notes by Nurse Practitioner. Corena Pilgrim, MD

## 2014-10-09 NOTE — ED Notes (Signed)
Called report to [facility], RN in [place] and pt moved with belongings to [facility] [address]

## 2014-10-09 NOTE — BH Assessment (Signed)
[facility] Assessment Progress Note  The following facilities have been contacted in an effort to place this pt with results as noted:  [facility]: at capacity *[facility]: beds available, referral faxed with decision pending  Jalene Mullet, MA Triage Specialist 10/09/2014 @ 17:14

## 2014-10-09 NOTE — BH Assessment (Addendum)
Tele Assessment Note   [name] is an 54 y.o. female. Presenting to ED with depression, anxiety, and cocaine use disorder, requesting help to get clean. Pt reports she is tired of her use and wants long term treatment to help her maintain sobriety. Pt reports "I am ready to get of drugs, I am so tired." Pt reports she recently had a bad batch of crack and badly burned her ear, she notes this as one of the reasons she is seeking help. She also reports being estranged from her family for 17 years, and not taking care of herself as additional reason to seek treatment. Pt is alert, oriented times 4. Reports SI with plan to slit wrists, and command hallucinations telling her to run in traffic. Judgement impaired due to active substance use. Reports vague HI with no plan, intent, or specific victims.   Pt reports she has been dealing with depression for many years. She is very tearful during interview. She reports she is not eating or sleeping, she is tearful, isolating, feels she has no support. She denies past suicide attempts but reports her command hallucinations are very overwhelming to her. She has trouble concentrating, loss of pleasure and motivation, and feels hopeless. She is unable to contract for safety at this time. She reports worsening auditory hallucinations with depression.   Pt reports frequent worry about her life and and infrequent panic attacks, with the last one being several months ago with no known trigger. Pt reports she is not sure if she has a hx of abuse due to being under the influence of drugs for so much of her life. She denies current phobias, or sx of PTSD.   Pt reports she began smoking crack at 16. She uses for 4 days then stops to eat and sleep. She reports infrequent use of THC. Reports she sometimes takes more opiate pain medication than prescribed.   Pt reports family hx is positive for alcohol abuse, and suicide attempt by uncle. She begs this Probation officer for help getting  long term treatment, stating she wants services to help her get and stay clean.   Axis I:  304.20 Cocaine Use Disorder, Severe  Rule out Opiate Use Disorder  296.24 Major Depressive Disorder, severe with psychotic features R/O schizoaffective disorder  300.00 Unspecified Anxiety Disorder Axis II: Deferred Axis III:  Past Medical History  Diagnosis Date  . 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 Followed by Dr. Louanne Skye  . Hypertension   . Cocaine abuse     ongoing with resultant vaculitis.  . Depression   . Normocytic anemia     BL Hgb 9.8-12. Last anemia panel 04/2010 - showing Fe 19, ferritin 101.  Pt on monthly B12 injections  . 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 CAP (community acquired pneumonia) 03/2014 X 2  . Daily headache   . Migraines     "probably 5-6/yr" (04/21/2014)  . Rheumatoid arthritis(714.0)     patient reported  . Inflammatory arthritis    Axis IV: housing problems, occupational problems, other psychosocial or environmental problems, problems with access to health care services and problems with primary support group Axis V: 21-30 behavior considerably influenced by delusions or hallucinations OR serious impairment in judgment, communication OR inability to function in almost all areas  Past Medical History:  Past Medical History  Diagnosis Date  . 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 Followed by Dr. Louanne Skye  .  Hypertension   . Cocaine abuse     ongoing with resultant vaculitis.  . Depression   . Normocytic anemia     BL Hgb 9.8-12. Last anemia panel 04/2010 - showing Fe 19, ferritin 101.  Pt on monthly B12 injections  . 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 CAP (community acquired pneumonia) 03/2014 X 2  . Daily headache   . Migraines     "probably 5-6/yr" (04/21/2014)  . Rheumatoid arthritis(714.0)     patient reported  . Inflammatory arthritis     Past Surgical History   Procedure Laterality Date  . Skin biopsy Bilateral 04/2010    shin nodules  . Irrigation and debridement abscess Bilateral 09/26/2013    Procedure: DEBRIDEMENT ULCERS BILATERAL THIGHS;  Surgeon: Gwenyth Ober, MD;  Location: [facility];  Service: General;  Laterality: Bilateral;  . Hernia repair      "stomach"    Family History:  Family History  Problem Relation Age of Onset  . Breast cancer Mother     Breast cancer  . Alcohol abuse Mother   . Colon cancer Maternal Aunt 88  . Alcohol abuse Father     Social History:  reports that she has been smoking Cigarettes.  She has a 10.2 pack-year smoking history. She has never used smokeless tobacco. She reports that she uses illicit drugs ("Crack" cocaine, Cocaine, and Marijuana). She reports that she does not drink alcohol.  Additional Social History:  Alcohol / Drug Use Pain Medications: Reports was prescribed hydrocodone for her arthritis. Reports she sometimes takes more than prescribed due to pain. Prescriptions: SEE PTA, reports has not taken Mental health medications in about 4 months. She is currently on antibiotics due to burn on her ear.  Over the Counter: SEE PTA History of alcohol / drug use?: Yes (Pt has been abusing crack cocaine since age 87. She uses aout 4 days per week. She reports she uses THC infrequently. ) Longest period of sobriety (when/how long): reports she has never had any sobriety with crack  Negative Consequences of Use: Personal relationships (reports injured herself with bad batch of crack, ear badly burned. ) Withdrawal Symptoms:  (reports body aches all over) Substance #1 Name of Substance 1: crack cocaine 1 - Age of First Use: 16 1 - Amount (size/oz): 2 rocks via smoking 1 - Frequency: 4 days on then off a couple of days "I will chill and eat for a couple of days." 1 - Duration: years at this level  1 - Last Use / Amount: 10-08-14, 2 rocks  Substance #2 Name of Substance 2: THC 2 - Age of First Use: 19 2 -  Amount (size/oz): did not specify  2 - Frequency: "Occassionally" 2 - Duration: years 2 - Last Use / Amount: unsure  CIWA: CIWA-Ar BP: 185/96 mmHg Pulse Rate: 87 COWS:    PATIENT STRENGTHS: (choose at least two) Communication skills  Report motivation for treatment and sobriety   Allergies:  Allergies  Allergen Reactions  . Acetaminophen Swelling    REACTION: eyelid swelling    Home Medications:  (Not in a hospital admission)  OB/GYN Status:  No LMP recorded. Patient is postmenopausal.  General Assessment Data Location of Assessment: [facility] ED Is this a Tele or Face-to-Face Assessment?: Face-to-Face Is this an Initial Assessment or a Re-assessment for this encounter?: Initial Assessment Living Arrangements: Other (Comment) (staying with friends) Can pt return to current living arrangement?: Yes Admission  Status: Voluntary Is patient capable of signing voluntary admission?: Yes Transfer from: Home Referral Source: Self/Family/Friend     [place] Living Arrangements: Other (Comment) (staying with friends) Name of Psychiatrist: Beverly Sessions but has missed appointments in past months Name of Therapist: [facility]   Education Status Is patient currently in school?: No Current Grade: NA Highest grade of school patient has completed: 69 Name of school: NA Contact person: NA  Risk to self with the past 6 months Suicidal Ideation: Yes-Currently Present Suicidal Intent: Yes-Currently Present Is patient at risk for suicide?: Yes Suicidal Plan?: Yes-Currently Present Specify Current Suicidal Plan: slit wrists with knife Access to Means: Yes Specify Access to Suicidal Means: knife What has been your use of drugs/alcohol within the last 12 months?: Pt has been abuse crack cocaine since age 23 Previous Attempts/Gestures: No How many times?: 0 Other Self Harm Risks: hurts self while using drugs Triggers for Past Attempts: None known Intentional Self Injurious Behavior:  Burning (reports has not done intentionally in years) Comment - Self Injurious Behavior: burned herself in past intentionally, recently burned ear accidentally due to drug use Family Suicide History: Yes (uncle attempted) Recent stressful life event(s): Other (Comment) (staying with friends, health problems, SA) Persecutory voices/beliefs?: No Depression: Yes Depression Symptoms: Despondent, Insomnia, Tearfulness, Isolating, Fatigue, Guilt, Loss of interest in usual pleasures, Feeling worthless/self pity Substance abuse history and/or treatment for substance abuse?: Yes Suicide prevention information given to non-admitted patients: Yes  Risk to Others within the past 6 months Homicidal Ideation: No-Not Currently/Within Last 6 Months Thoughts of Harm to Others: No-Not Currently Present/Within Last 6 Months Current Homicidal Intent: No Current Homicidal Plan: No Access to Homicidal Means: No Identified Victim: ideation at times no specific victim or plan  History of harm to others?: No Assessment of Violence: None Noted Violent Behavior Description: none Does patient have access to weapons?: No Criminal Charges Pending?: No Does patient have a court date: No  Psychosis Hallucinations: Auditory, With command (visual once) Delusions: None noted  Mental Status Report Appear/Hygiene: Disheveled Eye Contact: Good Motor Activity: Other (Comment) (tearful) Speech: Logical/coherent Level of Consciousness: Alert Mood: Depressed, Anxious Affect: Labile Anxiety Level: Moderate Judgement: Impaired Orientation: Person, Place, Time, Situation Obsessive Compulsive Thoughts/Behaviors: None  Cognitive Functioning Concentration: Normal Memory: Recent Intact, Remote Intact IQ: Average Insight: Fair Impulse Control: Poor Appetite: Poor Weight Loss: 20 Weight Gain: 0 Sleep: Decreased Total Hours of Sleep: 3 Vegetative Symptoms: Decreased grooming  ADLScreening [facility] Assessment  Services) Patient's cognitive ability adequate to safely complete daily activities?: Yes Patient able to express need for assistance with ADLs?: Yes Independently performs ADLs?: Yes (appropriate for developmental age) (reports has no supportive devices but feels she may need a shower chair  )  Prior Inpatient Therapy Prior Inpatient Therapy: Yes Prior Therapy Dates: 8/15, 4/15, 1/15, 11/14, 9/14, 8/14 Prior Therapy Facilty/Provider(s): [facility] [place] Reason for Treatment: Major Depressive Disorder, SA  Prior Outpatient Therapy Prior Outpatient Therapy: Yes Prior Therapy Dates: current Prior Therapy Facilty/Provider(s): [facility]  Reason for Treatment: SA, Depression  ADL Screening (condition at time of admission) Patient's cognitive ability adequate to safely complete daily activities?: Yes Is the patient deaf or have difficulty hearing?: No Does the patient have difficulty seeing, even when wearing glasses/contacts?: No Does the patient have difficulty concentrating, remembering, or making decisions?: No Patient able to express need for assistance with ADLs?: Yes Does the patient have difficulty dressing or bathing?: No Independently performs ADLs?: Yes (appropriate for developmental age) (reports has no  supportive devices but feels she may need a shower chair  ) Does the patient have difficulty walking or climbing stairs?: Yes Weakness of Legs: Both Weakness of Arms/Hands: Both  Home Assistive Devices/Equipment Home Assistive Devices/Equipment: None    Abuse/Neglect Assessment (Assessment to be complete while patient is alone) Physical Abuse: Denies (Reports she is not sure if she has been a victim of abuse due to her drug use) Verbal Abuse: Denies Sexual Abuse: Denies Exploitation of patient/patient's resources: Denies Self-Neglect: Denies Values / Beliefs Cultural Requests During Hospitalization: None Spiritual Requests During Hospitalization: None   Advance Directives (For  Healthcare) Does patient have an advance directive?: No Would patient like information on creating an advanced directive?: No - patient declined information    Additional Information 1:1 In Past 12 Months?: No CIRT Risk: No Elopement Risk: No Does patient have medical clearance?: No     Disposition:  Meets inpt criteria. No [facility] beds currently available. TTS to seek placement. Informed Dr. Claudine Mouton of plan to seek placement.   Lear Ng, [facility] Triage Specialist 10/09/2014 3:49 AM

## 2014-10-10 ENCOUNTER — Encounter (HOSPITAL_COMMUNITY): Payer: Self-pay | Admitting: Psychiatry

## 2014-10-10 DIAGNOSIS — F1994 Other psychoactive substance use, unspecified with psychoactive substance-induced mood disorder: Secondary | ICD-10-CM | POA: Insufficient documentation

## 2014-10-10 DIAGNOSIS — F1490 Cocaine use, unspecified, uncomplicated: Secondary | ICD-10-CM

## 2014-10-10 DIAGNOSIS — F1420 Cocaine dependence, uncomplicated: Secondary | ICD-10-CM | POA: Diagnosis present

## 2014-10-10 DIAGNOSIS — F3289 Other specified depressive episodes: Secondary | ICD-10-CM

## 2014-10-10 DIAGNOSIS — F1220 Cannabis dependence, uncomplicated: Secondary | ICD-10-CM | POA: Diagnosis present

## 2014-10-10 DIAGNOSIS — R45851 Suicidal ideations: Secondary | ICD-10-CM

## 2014-10-10 DIAGNOSIS — F19929 Other psychoactive substance use, unspecified with intoxication, unspecified: Secondary | ICD-10-CM

## 2014-10-10 MED ORDER — INFLUENZA VAC SPLIT QUAD 0.5 ML IM SUSY
0.5000 mL | PREFILLED_SYRINGE | INTRAMUSCULAR | Status: AC
  Administered 2014-10-11: 0.5 mL via INTRAMUSCULAR
  Filled 2014-10-10: qty 0.5

## 2014-10-10 MED ORDER — PNEUMOCOCCAL VAC POLYVALENT 25 MCG/0.5ML IJ INJ
0.5000 mL | INJECTION | INTRAMUSCULAR | Status: AC
Start: 2014-10-11 — End: 2014-10-11
  Administered 2014-10-11: 0.5 mL via INTRAMUSCULAR

## 2014-10-10 MED ORDER — SULFAMETHOXAZOLE-TRIMETHOPRIM 800-160 MG PO TABS
1.0000 | ORAL_TABLET | Freq: Two times a day (BID) | ORAL | Status: AC
  Administered 2014-10-10 – 2014-10-12 (×6): 1 via ORAL
  Filled 2014-10-10 (×7): qty 1

## 2014-10-10 MED ORDER — ENSURE COMPLETE PO LIQD
237.0000 mL | Freq: Three times a day (TID) | ORAL | Status: DC
  Administered 2014-10-10 – 2014-10-17 (×7): 237 mL via ORAL

## 2014-10-10 MED ORDER — HYDROCORTISONE 0.5 % EX CREA
TOPICAL_CREAM | Freq: Three times a day (TID) | CUTANEOUS | Status: DC | PRN
  Administered 2014-10-11 – 2014-10-16 (×9): via TOPICAL
  Filled 2014-10-10 (×3): qty 28.35

## 2014-10-10 NOTE — [facility] Group Notes (Addendum)
[facility] Group Therapy  10/10/2014 , 1:37 PM    Type of Therapy:  [facility] Presentation  Participation Level:  Invited.  Chose to not attend.  Summary of Progress/Problems:  Shanon Brow from [place] came to present his recovery story and play the guitar.   Roque Lias B 10/10/2014 , 1:37 PM

## 2014-10-10 NOTE — H&P (Signed)
Psychiatric Admission Assessment Adult  Patient Identification:  [name] Date of Evaluation:  10/10/2014 Chief Complaint:  "I am so tired of using drugs that it makes me very depressed."  History of Present Illness:: [name] is an 54 y.o. Female who presented to [facility] with depression, anxiety, and cocaine use disorder, requesting help to get clean. Pt reports she is tired of her use and wants long term treatment to help her maintain sobriety. In the ED the patient reported suicidal thoughts to cut wrists and command hallucinations telling her to run into traffic. She also reports being estranged from her family for 17 years, and not taking care of herself as additional reason to seek treatment. Patient states during her psychiatric assessment "I keep doing drugs. I just smoke crack all day long. My friends give it to me for free. I'm so tired of it and want a change. I am ready to get off it. I'm not wanting to hurt myself today but the thought of suicide crosses my mind. I hear voices telling me to do stuff. All of this makes me very sad." The patient becomes very tearful during the interview process and appears depressed. Her urine drug screen is positive for cocaine and marijuana. Zanita verbalizes that the voices are worse when she is actively using cocaine and that today they are not bothering her. The patient is observed itching her legs and arms during the assessment. She reports there being a bedbug infestation where she had been staying with friends. Yesterday she was made to be a Do Not Admit because of her skin lesions. 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 She has also not been compliant with her prescription psychiatric medications due to her cocaine abuse. Patient reports following up with [facility] in the past but reported "I stopped going."   Elements:  Location:  [facility] Quality:  Depression, substance abuse  Severity:   suicidal . Timing:  Last few weeks Duration:  Chronic for many years Context:  Ongoing cocaine abuse, homeless, depression Associated Signs/Synptoms: Depression Symptoms:  depressed mood, anhedonia, insomnia, psychomotor agitation, feelings of worthlessness/guilt, hopelessness, impaired memory, recurrent thoughts of death, suicidal thoughts with specific plan, weight loss, decreased labido, decreased appetite, (Hypo) Manic Symptoms:  Distractibility, Impulsivity, Irritable Mood, Labiality of Mood, Anxiety Symptoms:  Excessive Worry, Psychotic Symptoms:  Hallucinations: Auditory (Last three days ago when actively using cocaine) PTSD Symptoms: NA  Psychiatric Specialty Exam: Physical Exam  Constitutional:  Physical exam findings reviewed from [place] on 10/09/14 and I concur with no noted exceptions.   Skin: Skin is warm and dry.  Patient has multiple red raised bumps on her elbows and the left inner aspect of her left leg. There are old healed lesions over her legs and arms from past cocaine vasculitis.   Psychiatric: She is agitated, aggressive and actively hallucinating. She expresses impulsivity. She exhibits a depressed mood. She expresses suicidal ideation. She expresses suicidal plans.    Review of Systems  Constitutional: Negative for fever, chills, weight loss, malaise/fatigue and diaphoresis.  HENT: Negative.  Negative for congestion, ear discharge, ear pain, hearing loss, nosebleeds, sore throat and tinnitus.   Eyes: Negative.  Negative for blurred vision, double vision, photophobia, pain, discharge and redness.  Respiratory: Negative.  Negative for cough, hemoptysis, sputum production, shortness of breath, wheezing and stridor.   Cardiovascular: Negative.  Negative for chest pain, palpitations, orthopnea, claudication, leg swelling and  PND.  Gastrointestinal: Negative.  Negative for heartburn, nausea, vomiting, abdominal pain, diarrhea, constipation, blood in stool and  melena.  Genitourinary: Negative.  Negative for dysuria, urgency, frequency, hematuria and flank pain.  Musculoskeletal: Negative for myalgias, back pain, joint pain, falls and neck pain.  Skin: Positive for itching.  Neurological: Negative.  Negative for dizziness, tingling, tremors, sensory change, speech change, focal weakness, seizures, loss of consciousness, weakness and headaches.  Endo/Heme/Allergies: Negative.  Negative for environmental allergies and polydipsia. Does not bruise/bleed easily.  Psychiatric/Behavioral: Positive for depression, suicidal ideas and substance abuse. Negative for hallucinations and memory loss. The patient is nervous/anxious and has insomnia.     Blood pressure 141/97, pulse 77, temperature 97.4 F (36.3 C), temperature source Oral, resp. rate 18, height 5' 1.5" (1.562 m), weight 42.638 kg (94 lb).Body mass index is 17.48 kg/(m^2).  General Appearance: Disheveled  Eye Sport and exercise psychologist::  Fair  Speech:  Slow  Volume:  Decreased  Mood:  Depressed  Affect:  Tearful  Thought Process:  Coherent  Orientation:  Full (Time, Place, and Person)  Thought Content:  Worries, concerns  Suicidal Thoughts:  Yes.  without intent/plan  Homicidal Thoughts:  No  Memory:  Immediate;   Fair Recent;   Fair Remote;   Fair  Judgement:  Poor  Insight:  Shallow  Psychomotor Activity:  Decreased  Concentration:  Fair  Recall:  Fair  Akathisia:  No  Handed:  Right  AIMS (if indicated):     Assets:  Communication Skills Desire for Improvement Resilience  Sleep:  Number of Hours: 5.75  Language: [place] of knowledge: Fair  Musculoskeletal:  Strength & Muscle Tone: within normal limits  Gait & Station: normal  Patient leans: N/A  Past Psychiatric History:Yes  Diagnosis: cocaine dependence and depression  Hospitalizations: [facility] several times   Outpatient Care: [facility]  Substance Abuse Care: at [facility] earlier this year for substance abuse   Self-Mutilation:no  Suicidal  Attempts: Denies  Violent Behaviors: no   Past Medical History:   Past Medical History  Diagnosis Date  . 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 Followed by Dr. Louanne Skye  . Hypertension   . Cocaine abuse     ongoing with resultant vaculitis.  . Depression   . Normocytic anemia     BL Hgb 9.8-12. Last anemia panel 04/2010 - showing Fe 19, ferritin 101.  Pt on monthly B12 injections  . 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 CAP (community acquired pneumonia) 03/2014 X 2  . Daily headache   . Migraines     "probably 5-6/yr" (04/21/2014)  . Rheumatoid arthritis(714.0)     patient reported  . Inflammatory arthritis    None. Allergies:   Allergies  Allergen Reactions  . Acetaminophen Swelling    REACTION: eyelid swelling   PTA Medications: Prescriptions prior to admission  Medication Sig Dispense Refill Last Dose  . aspirin 325 MG tablet Take 325 mg by mouth daily.   10/09/2014 at Unknown time  . ciprofloxacin (CIPRO) 500 MG tablet Take 1 tablet (500 mg total) by mouth 2 (two) times daily. 14 tablet 0 10/09/2014 at Unknown time  . fluconazole (DIFLUCAN) 150 MG tablet Take 1 tablet (150 mg total) by mouth 1 day or 1 dose. 7 tablet 0 10/09/2014 at Unknown time  . meloxicam (MOBIC) 15 MG tablet Take 1 tablet (15 mg total) by mouth daily. 20 tablet 0 10/09/2014 at Unknown time  . oxyCODONE (  ROXICODONE) 5 MG immediate release tablet Take 1 tablet (5 mg total) by mouth every 6 (six) hours as needed for severe pain. 15 tablet 0 10/08/2014 at Unknown time  . predniSONE (DELTASONE) 20 MG tablet Take 2 tablets (40 mg total) by mouth daily. Take 40 mg by mouth daily for 3 days, then 21m by mouth daily for 3 days, then 141mdaily for 3 days 12 tablet 0 Past Week at Unknown time  . sulfamethoxazole-trimethoprim (SEPTRA DS) 800-160 MG per tablet Take 1 tablet by mouth every 12 (twelve) hours. 20 tablet 0 10/09/2014 at Unknown time  . buPROPion (WELLBUTRIN) 100 MG tablet Take 1  tablet (100 mg total) by mouth daily before breakfast. For depression 30 tablet 0 More than a month at Unknown time  . ibuprofen (ADVIL,MOTRIN) 200 MG tablet Take 200 mg by mouth every 6 (six) hours as needed for moderate pain.    More than a month at Unknown time  . permethrin (ELIMITE) 5 % cream Apply to affected area once 60 g 0 More than a month at Unknown time  . silver sulfADIAZINE (SILVADENE) 1 % cream Apply 1 application topically 2 (two) times daily. To affected skin areas 50 g 0 More than a month at Unknown time    Previous Psychotropic Medications:  Medication/Dose  Risperdal, Wellbutrin               Substance Abuse History in the last 12 months:  yes  Consequences of Substance Abuse: Medical Consequences:  peripheral vasculitis Worsening of mental health, reports hearing voices when using cocaine   Social History:  reports that she has been smoking Cigarettes.  She has a 10.2 pack-year smoking history. She has never used smokeless tobacco. She reports that she uses illicit drugs ("Crack" cocaine, Cocaine, and Marijuana). She reports that she does not drink alcohol. Additional Social History:   Current Place of Residence:   [place] Birth[place] Marital Status:  Single Children:0  Sons:  Daughters: Relationships: Education:  HSLevi Straussroblems/Performance: Religious Beliefs/Practices: History of Abuse (Emotional/Phsycial/Sexual) OcShip brokeristory:  None. Legal History: Denies Hobbies/Interests: Watching movies  Family History:   Family History  Problem Relation Age of Onset  . Breast cancer Mother     Breast cancer  . Alcohol abuse Mother   . Colon cancer Maternal Aunt [id number]. Alcohol abuse Father     Results for orders placed or performed during the hospital encounter of 10/09/14 (from the past 72 hour(s))  CBC with Differential     Status: Abnormal   Collection Time: 10/09/14  3:36 AM   Result Value Ref Range   WBC 3.2 (L) 4.0 - 10.5 K/uL   RBC 3.87 3.87 - 5.11 MIL/uL   Hemoglobin 11.3 (L) 12.0 - 15.0 g/dL   HCT 33.0 (L) 36.0 - 46.0 %   MCV 85.3 78.0 - 100.0 fL   MCH 29.2 26.0 - 34.0 pg   MCHC 34.2 30.0 - 36.0 g/dL   RDW 13.8 11.5 - 15.5 %   Platelets 262 150 - 400 K/uL   Neutrophils Relative % 59 43 - 77 %   Neutro Abs 1.9 1.7 - 7.7 K/uL   Lymphocytes Relative 35 12 - 46 %   Lymphs Abs 1.1 0.7 - 4.0 K/uL   Monocytes Relative 5 3 - 12 %   Monocytes Absolute 0.2 0.1 - 1.0 K/uL   Eosinophils Relative 1 0 - 5 %   Eosinophils Absolute 0.0  0.0 - 0.7 K/uL   Basophils Relative 0 0 - 1 %   Basophils Absolute 0.0 0.0 - 0.1 K/uL  Ethanol     Status: None   Collection Time: 10/09/14  3:36 AM  Result Value Ref Range   Alcohol, Ethyl (B) <5 0 - 9 mg/dL    Comment:        LOWEST DETECTABLE LIMIT FOR SERUM ALCOHOL IS 11 mg/dL FOR MEDICAL PURPOSES ONLY   I-stat chem 8, ed     Status: Abnormal   Collection Time: 10/09/14  3:44 AM  Result Value Ref Range   Sodium 141 135 - 145 mmol/L   Potassium 3.6 3.5 - 5.1 mmol/L   Chloride 102 96 - 112 mEq/L   BUN 13 6 - 23 mg/dL   Creatinine, Ser 0.80 0.50 - 1.10 mg/dL   Glucose, Bld 99 70 - 99 mg/dL   Calcium, Ion 1.20 1.12 - 1.23 mmol/L   TCO2 23 0 - 100 mmol/L   Hemoglobin 11.2 (L) 12.0 - 15.0 g/dL   HCT 33.0 (L) 36.0 - 46.0 %  Urinalysis, Routine w reflex microscopic     Status: Abnormal   Collection Time: 10/09/14  5:20 AM  Result Value Ref Range   Color, Urine YELLOW YELLOW   APPearance TURBID (A) CLEAR   Specific Gravity, Urine 1.023 1.005 - 1.030   pH 7.5 5.0 - 8.0   Glucose, UA NEGATIVE NEGATIVE mg/dL   Hgb urine dipstick NEGATIVE NEGATIVE   Bilirubin Urine SMALL (A) NEGATIVE   Ketones, ur NEGATIVE NEGATIVE mg/dL   Protein, ur NEGATIVE NEGATIVE mg/dL   Urobilinogen, UA 4.0 (H) 0.0 - 1.0 mg/dL   Nitrite NEGATIVE NEGATIVE   Leukocytes, UA SMALL (A) NEGATIVE  Pregnancy, urine     Status: None   Collection Time:  10/09/14  5:20 AM  Result Value Ref Range   Preg Test, Ur NEGATIVE NEGATIVE    Comment:        THE SENSITIVITY OF THIS METHODOLOGY IS >20 mIU/mL.   Urine microscopic-add on     Status: Abnormal   Collection Time: 10/09/14  5:20 AM  Result Value Ref Range   Squamous Epithelial / LPF MANY (A) RARE   WBC, UA 3-6 <3 WBC/hpf   Bacteria, UA MANY (A) RARE  I-stat troponin, ED     Status: None   Collection Time: 10/09/14  5:51 AM  Result Value Ref Range   Troponin i, poc 0.00 0.00 - 0.08 ng/mL   Comment 3            Comment: Due to the release kinetics of cTnI, a negative result within the first hours of the onset of symptoms does not rule out myocardial infarction with certainty. If myocardial infarction is still suspected, repeat the test at appropriate intervals.   Urine rapid drug screen (hosp performed)     Status: Abnormal   Collection Time: 10/09/14  2:05 PM  Result Value Ref Range   Opiates NONE DETECTED NONE DETECTED   Cocaine POSITIVE (A) NONE DETECTED   Benzodiazepines NONE DETECTED NONE DETECTED   Amphetamines NONE DETECTED NONE DETECTED   Tetrahydrocannabinol POSITIVE (A) NONE DETECTED   Barbiturates NONE DETECTED NONE DETECTED    Comment:        DRUG SCREEN FOR MEDICAL PURPOSES ONLY.  IF CONFIRMATION IS NEEDED FOR ANY PURPOSE, NOTIFY LAB WITHIN 5 DAYS.        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 Followed by Dr. Louanne Skye  . Hypertension   . Cocaine abuse     ongoing with resultant vaculitis.  . Depression   . Normocytic anemia      BL Hgb 9.8-12. Last anemia panel 04/2010 - showing Fe 19, ferritin 101.  Pt on monthly B12 injections  . 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 CAP (community acquired pneumonia) 03/2014 X 2  . Daily headache   . Migraines     "probably 5-6/yr" (04/21/2014)  . Rheumatoid arthritis(714.0)     patient reported  . Inflammatory arthritis    AXIS IV:  economic problems, educational problems, occupational problems, other psychosocial or environmental problems, problems related to social environment and problems with primary support group AXIS V:  31-40 impairment in reality testing  Treatment Plan/Recommendations:   1. Admit for crisis management and stabilization. Estimated length of stay 5-7 days. 2. Medication management to reduce current symptoms to base line and improve the patient's level of functioning. Trazodone initiated to help improve sleep. 3. Develop treatment plan to decrease risk of relapse upon discharge of depressive and psychotic symptoms and the need for readmission. 5. Group therapy to facilitate development of healthy coping skills to use for depression and psychosis.  6. Health care follow up as needed for medical problems. Apply Bacitracin ointment twice daily to ulcerative areas on pinna of ears.  7. Discharge plan to include therapy to help patient cope with  stressors.  8. Call for Consult with Hospitalist for additional specialty patient services as needed.   Treatment Plan Summary: Daily contact with patient to assess and evaluate symptoms and progress in treatment Medication management Current Medications:  Current Facility-Administered Medications  Medication Dose Route Frequency Provider Last Rate Last Dose  . alum & mag hydroxide-simeth (MAALOX/MYLANTA) 200-200-20 MG/5ML suspension 30 mL  30 mL Oral Q4H PRN Laverle Hobby, PA-C      . aspirin tablet 325 mg  325 mg Oral Daily Laverle Hobby, PA-C   325 mg at 10/10/14 1025  .  buPROPion [facility]) tablet 100 mg  100 mg Oral QAC breakfast Laverle Hobby, PA-C   100 mg at 10/10/14 8527  . feeding supplement (ENSURE COMPLETE) (ENSURE COMPLETE) liquid 237 mL  237 mL Oral TID BM Dorann Ou, RD   237 mL at 10/10/14 1400  . hydrocortisone cream 0.5 %   Topical TID PRN Elmarie Shiley, NP      . hydrOXYzine (ATARAX/VISTARIL) tablet 25 mg  25 mg Oral Q6H PRN Laverle Hobby, PA-C   25 mg at 10/09/14 2346  . ibuprofen (ADVIL,MOTRIN) tablet 600 mg  600 mg Oral Q6H PRN Laverle Hobby, PA-C      . [START ON 10/11/2014] Influenza vac split quadrivalent PF (FLUARIX) injection 0.5 mL  0.5 mL Intramuscular Tomorrow-1000  Ursula Alert, MD      . magnesium hydroxide (MILK OF MAGNESIA) suspension 30 mL  30 mL Oral Daily PRN Laverle Hobby, PA-C      . neomycin-bacitracin-polymyxin (NEOSPORIN) ointment   Topical PRN Laverle Hobby, PA-C      . [START ON 10/11/2014] pneumococcal 23 valent vaccine (PNU-IMMUNE) injection 0.5 mL  0.5 mL Intramuscular Tomorrow-1000 Saramma Eappen, MD      . sulfamethoxazole-trimethoprim (BACTRIM DS,SEPTRA DS) 800-160 MG per tablet 1 tablet  1 tablet Oral Q12H Elmarie Shiley, NP   1 tablet at 10/10/14 1135  . traZODone (DESYREL) tablet 50 mg  50 mg Oral QHS,MR X 1 Spencer E Simon, PA-C   50 mg at 10/09/14 2346    Observation Level/Precautions:  15 minute checks  Laboratory:  Reviewed admission labs  Psychotherapy:  Group and milieu therapy  Medications: Continue Wellbutrin 100 mg daily for depression  Consultations:  As needed  Discharge Concerns:  Continue cocaine abuse   Estimated LOS: 4-7 days  Other:  UDS positive for cocaine, Continue Septra DS for treatment of skin infection which was started in the [facility], Hydrocortisone cream prn for itching   I certify that inpatient services furnished can reasonably be expected to improve the patient's condition.    Elmarie Shiley, NP-C 1/6/20162:27 PM
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
## 2014-10-10 NOTE — Tx Team (Signed)
Initial Interdisciplinary Treatment Plan   PATIENT STRESSORS: Health problems Medication change or noncompliance Substance abuse   PATIENT STRENGTHS: Ability for insight Active sense of humor General fund of knowledge Motivation for treatment/growth   PROBLEM LIST: Problem List/Patient Goals Date to be addressed Date deferred Reason deferred Estimated date of resolution  Risk for suicide      Depression      "I want to get clean from drugs, get self together, I might not have another time"      "I'm sick and tired of being sick and tired"       "I'm ready to have a life without drugs"                               DISCHARGE CRITERIA:  Ability to meet basic life and health needs Adequate post-discharge living arrangements Need for constant or close observation no longer present Verbal commitment to aftercare and medication compliance  PRELIMINARY DISCHARGE PLAN: Attend aftercare/continuing care group Outpatient therapy Placement in alternative living arrangements  PATIENT/FAMIILY INVOLVEMENT: This treatment plan has been presented to and reviewed with the patient, [name].  The patient and family have been given the opportunity to ask questions and make suggestions.  Vonzella Nipple A 10/10/2014, 12:39 AM

## 2014-10-10 NOTE — Progress Notes (Signed)
[name], female   DOB: 06/16/1961, 54 y.o.   MRN: [id number]  Admission Note:  D:53 yr female who presents [facility] in no acute distress for the treatment of SI, SA and Depression. Pt appears flat and depressed. Pt was calm and cooperative with admission process. Pt presents with passive SI and contracts for safety upon admission. Pt endorses passive  AVH . "I want to get clean from drugs, get self together, I might not have another time". Pt stated she just got fed up and want to get clean before its too late. Pt stated she had an episode with bed bugs a month ago and was treated. Pt presented with numerous bites that were scabbed over, pt does not present with any active Sx of current bed bugs. Pt was made Do not admit for 24 hrs by PA to be sure.   A:Skin was assessed and found to have tattoo on L-shoulder. Pt has numerous scabbed over bed bug bites all over her body. POC and unit policies explained and understanding verbalized. Consents obtained. Food and fluids offered, and  accepted.   R:Pt had no additional questions or concerns.

## 2014-10-10 NOTE — Progress Notes (Signed)
D: Patient presents with depressed affect and mood. She declined completing the self inventory sheet. Patient rested in bed the majority of the morning and then finally rose to take noon time medications and go to the cafeteria. She has not attended or participated in any of the group sessions today. Patient has been compliant with the current medication regimen.  A: Support and encouragement provided to patient. Scheduled medications given per MD orders. Maintain Q15 minute checks for safety.  R: Patient receptive. Denies SI/HI and AVH. Patient remains safe on the [place].

## 2014-10-10 NOTE — BHH Suicide Risk Assessment (Signed)
   Nursing information obtained from:  Patient Demographic factors:  Unemployed, Living alone, Low socioeconomic status Current Mental Status:  Self-harm thoughts, Belief that plan would result in death Loss Factors:  Financial problems / change in socioeconomic status Historical Factors:  NA Risk Reduction Factors:  Religious beliefs about death Total Time spent with patient: 30 minutes  CLINICAL FACTORS:   Alcohol/Substance Abuse/Dependencies Unstable or Poor Therapeutic Relationship Previous Psychiatric Diagnoses and Treatments  Psychiatric Specialty Exam: Physical Exam  ROS  Blood pressure 141/97, pulse 77, temperature 97.4 F (36.3 C), temperature source Oral, resp. rate 18, height 5' 1.5" (1.562 m), weight 42.638 kg (94 lb).Body mass index is 17.48 kg/(m^2).  General Appearance: Disheveled  Eye Sport and exercise psychologist::  Fair  Speech:  Normal Rate  Volume:  Normal  Mood:  Anxious and Depressed  Affect:  Labile  Thought Process:  Goal Directed  Orientation:  Full (Time, Place, and Person)  Thought Content:  WDL  Suicidal Thoughts:  Yes.  without intent/plan  Homicidal Thoughts:  No  Memory:  Immediate;   Fair Recent;   Fair Remote;   Fair  Judgement:  Impaired  Insight:  Shallow  Psychomotor Activity:  Normal  Concentration:  Fair  Recall:  [place]  Language: Fair  Akathisia:  No  Handed:  Right  AIMS (if indicated):     Assets:  Communication Skills Desire for Improvement  Sleep:  Number of Hours: 5.75   Musculoskeletal: Strength & Muscle Tone: within normal limits Gait & Station: normal Patient leans: N/A  COGNITIVE FEATURES THAT CONTRIBUTE TO RISK:  Polarized thinking    SUICIDE RISK:   Severe:  Frequent, intense, and enduring suicidal ideation, specific plan, no subjective intent, but some objective markers of intent (i.e., choice of lethal method), the method is accessible, some limited preparatory behavior, evidence of impaired self-control,  severe dysphoria/symptomatology, multiple risk factors present, and few if any protective factors, particularly a lack of social support.  PLAN OF CARE:Please see H&P.   I certify that inpatient services furnished can reasonably be expected to improve the patient's condition.  Balthazar Dooly MD 10/10/2014, 2:52 PM

## 2014-10-10 NOTE — Progress Notes (Signed)
NUTRITION ASSESSMENT  Pt identified as at risk on the Malnutrition Screen Tool  INTERVENTION: 1. Educated patient on the importance of nutrition and encouraged intake of food and beverages. 2. Discussed weight goals. 3. Supplements: Ensure Complete po TID, each supplement provides 350 kcal and 13 grams of protein  NUTRITION DIAGNOSIS: Unintentional weight loss related to sub-optimal intake as evidenced by pt report.   Goal: Pt to meet >/= 90% of their estimated nutrition needs.  Monitor:  PO intake, weight trend, labs  Assessment:  Pt reports that she weighed 120 lbs ~ 5 months ago. Pt has had a varied appetite prior to admission. She is eating better in the hospital. Pt has been drinking Ensure which she likes and would like to have 3 times per day. Pt is underweight.   54 y.o. female  Height: Ht Readings from Last 1 Encounters:  10/09/14 5' 1.5" (1.562 m)    Weight: Wt Readings from Last 1 Encounters:  10/09/14 94 lb (42.638 kg)    Weight Hx: Wt Readings from Last 10 Encounters:  10/09/14 94 lb (42.638 kg)  10/09/14 96 lb (43.545 kg)  09/24/14 99 lb 11.2 oz (45.224 kg)  05/07/14 123 lb (55.792 kg)  04/30/14 125 lb 4.8 oz (56.836 kg)  04/19/14 113 lb 1.5 oz (51.3 kg)  04/19/14 117 lb 9.6 oz (53.343 kg)  04/11/14 120 lb 11.2 oz (54.749 kg)  03/30/14 111 lb 8.8 oz (50.6 kg)  03/26/14 118 lb 14.4 oz (53.933 kg)    BMI:  Body mass index is 17.48 kg/(m^2). Pt meets criteria for underweight based on current BMI.  Estimated Nutritional Needs: Kcal: 25-30 kcal/kg Protein: > 1 gram protein/kg Fluid: 1 ml/kcal  Diet Order: Diet regular Pt is also offered choice of unit snacks mid-morning and mid-afternoon.  Pt is eating as desired.   Lab results and medications reviewed.   Laurette Schimke MS, RD, LDN

## 2014-10-10 NOTE — BHH Group Notes (Signed)
Adult Psychoeducational Group Note  Date:  10/10/2014 Time:  9:46 PM  Group Topic/Focus:  NA Meeting  Participation Level:  Did Not Attend  Participation Quality:  None  Affect:  None  Cognitive:  None  Insight: None  Engagement in Group:  None  Modes of Intervention:  Discussion and Education  Additional Comments:  Charene did not attend group.  [name] A 10/10/2014, 9:46 PM

## 2014-10-10 NOTE — Progress Notes (Signed)
Patient in bed sleeping at the beginning of this shift. Mood and affects flat and depressed. Patient later came to the window for her HS medications. She denied SI/HI and denied Hallucinations. Writer encouraged and supported patient. Q 15 minute check continues as ordered to maintain safety.

## 2014-10-11 ENCOUNTER — Ambulatory Visit: Payer: Self-pay | Admitting: Internal Medicine

## 2014-10-11 LAB — TSH: TSH: 1.371 u[IU]/mL (ref 0.350–4.500)

## 2014-10-11 NOTE — Clinical Social Work Note (Signed)
Referral sent to [facility] today.

## 2014-10-11 NOTE — Tx Team (Signed)
  Interdisciplinary Treatment Plan Update   Date Reviewed:  10/11/2014  Time Reviewed:  11:01 AM  Progress in Treatment:   Attending groups: No Participating in groups: No Taking medication as prescribed: Yes  Tolerating medication: Yes Family/Significant other contact made: No Patient understands diagnosis: Yes AEB asking for help with addiction to cocaine Discussing patient identified problems/goals with staff: Yes  See initial care plan Medical problems stabilized or resolved: Yes Denies suicidal/homicidal ideation: Yes  In tx team Patient has not harmed self or others: Yes  For review of initial/current patient goals, please see plan of care.  Estimated Length of Stay:  4-5 days  Reason for Continuation of Hospitalization: Medication stabilization  Depression  New Problems/Goals identified:  N/A  Discharge Plan or Barriers:   Hopes to get into [facility]  Additional Comments:  [name] is an 54 y.o. Female who presented to [facility] with depression, anxiety, and cocaine use disorder, requesting help to get clean. Pt reports she is tired of her use and wants long term treatment to help her maintain sobriety. In the ED the patient reported suicidal thoughts to cut wrists and command hallucinations telling her to run into traffic. She also reports being estranged from her family for 17 years, and not taking care of herself as additional reason to seek treatment. Patient states during her psychiatric assessment "I keep doing drugs. I just smoke crack all day long. My friends give it to me for free. I'm so tired of it and want a change. I am ready to get off it. I'm not wanting to hurt myself today but the thought of suicide crosses my mind. I hear voices telling me to do stuff. All of this makes me very sad." The patient becomes very tearful during the interview process and appears depressed. Her urine drug screen is positive for cocaine and marijuana. [name] verbalizes that the voices are worse  when she is actively using cocaine and that today they are not bothering her. The patient is observed itching her legs and arms during the assessment. She reports there being a bedbug infestation where she had been staying with friends. Yesterday she was made to be a Do Not Admit because of her skin lesions. 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 She has also not been compliant with her prescription psychiatric medications due to her cocaine abuse. Patient reports following up with [facility] in the past but reported "I stopped going."   Attendees:  Signature: Carlton Adam, MD 10/11/2014 11:01 AM   Signature: Ripley Fraise, LCSW 10/11/2014 11:01 AM  Signature: Terald Sleeper, RN 10/11/2014 11:01 AM  Signature:  10/11/2014 11:01 AM  Signature:  10/11/2014 11:01 AM  Signature:  10/11/2014 11:01 AM  Signature:   10/11/2014 11:01 AM  Signature:    Signature:    Signature:    Signature:    Signature:    Signature:      Scribe for Treatment Team:   Ripley Fraise, LCSW  10/11/2014 11:01 AM

## 2014-10-11 NOTE — Progress Notes (Signed)
D: Patient is alert and oriented. Pt's mood and affect is flat and blunted. Pt's eye contact is brief. Pt has a rash on bilateral arms and legs that is flaky and dry. Pt has 1/2 inch wound on right middle finger; pt reports she does not know how it go there and states she had it prior to admission. Pt complained of itchiness on arms this afternoon. Pt is not attending groups and remains in bed the majority of the day. Pt's BP low; pt denies symptoms (See docflowsheet-vitals). Pt denies SI/HI and VH. Pt reports she is hearing people "screaming" this morning. A: Provider, [place] made aware of pt's rash. NP, May made aware of pt's laceration. Pt encouraged to increase fluids; will reassess BP frequently. PRN medication administered for itching per providers orders (See MAR). Scheduled medications administered per providers orders (See MAR). 15 minute checks continued per protocol for patient safety.  R: Patient cooperative and receptive to nursing interventions.

## 2014-10-11 NOTE — Progress Notes (Signed)
[facility] Group Notes:  (Nursing/MHT/Case Management/Adjunct)  Date:  10/11/2014  Time:  0915  Type of Therapy:  Psychoeducational Skills  Participation Level:  Did Not Attend  Participation Quality:  n/a  Affect: n/a  Cognitive: n/a  Insight: n/a  Engagement in Group: n/a  Modes of Intervention:  Discussion, Education, Exploration and Support  Summary of Progress/Problems: Pt did not attend group. Pt in bed asleep.  Elenore Rota 10/11/2014, 11:29 AM

## 2014-10-11 NOTE — Progress Notes (Signed)
Recreation Therapy Notes  Animal-Assisted Activity/Therapy (AAA/T) Program Checklist/Progress Notes Patient Eligibility Criteria Checklist & Daily Group note for Rec Tx Intervention  Date: 01.07.2015 Time: 2:45pm Location: [address]   AAA/T Program Assumption of Risk Form signed by Patient/ or Parent Legal Guardian yes  Patient is free of allergies or sever asthma yes  Patient reports no fear of animals yes  Patient reports no history of cruelty to animals yes  Patient understands his/her participation is voluntary yes  Behavioral Response: Did not attend.   Laureen Ochs Saylor Sheckler, LRT/CTRS  Roshawnda Pecora L 10/11/2014 4:20 PM

## 2014-10-11 NOTE — Progress Notes (Signed)
[facility] MD Progress Note  10/11/2014 2:53 PM [name]  MRN:  [id number] Subjective:  [name] states that she is trying to get her life back together. She states that she was getting increasingly more depressed, increasingly more suicidal and that she was hearing voices more so when using the cocaine. She wants to go to a long term treatment program as states she is not going to be able to make it otherwise. The place she is staying is not helping her to stay abstinent. Admits she has not complied with outpatient follow up Diagnosis:   DSM5: Substance/Addictive Disorders:  Cocaine related disorder Depressive Disorders:  Major Depressive Disorder - Severe (296.23) Total Time spent with patient: 30 minutes  Axis I: Substance Induced Mood Disorder  ADL's:  Intact  Sleep: Poor  Appetite:  Poor  Psychiatric Specialty Exam: Physical Exam  Review of Systems  Constitutional: Positive for malaise/fatigue.  HENT: Negative.   Eyes: Negative.   Respiratory: Negative.   Cardiovascular: Negative.   Gastrointestinal: Negative.   Genitourinary: Negative.   Musculoskeletal: Negative.   Skin: Positive for itching and rash.  Neurological: Positive for weakness.  Endo/Heme/Allergies: Negative.   Psychiatric/Behavioral: Positive for depression and substance abuse. The patient is nervous/anxious and has insomnia.     Blood pressure 120/41, pulse 57, temperature 97.6 F (36.4 C), temperature source Oral, resp. rate 18, height 5' 1.5" (1.562 m), weight 42.638 kg (94 lb).Body mass index is 17.48 kg/(m^2).  General Appearance: Fairly Groomed  Engineer, water::  Fair  Speech:  Clear and Coherent  Volume:  Normal  Mood:  Anxious, Depressed, Dysphoric and worried  Affect:  anxious worried  Thought Process:  Coherent and Goal Directed  Orientation:  Full (Time, Place, and Person)  Thought Content:  most recent events, symptoms worries concerns  Suicidal Thoughts:  Yes.  without intent/plan  Homicidal  Thoughts:  No  Memory:  Immediate;   Fair Recent;   Fair Remote;   Fair  Judgement:  Fair  Insight:  Present  Psychomotor Activity:  Restlessness  Concentration:  Fair  Recall:  [employer] of Knowledge:Poor  Language: Fair  Akathisia:  No  Handed:  Right  AIMS (if indicated):     Assets:  Desire for Improvement  Sleep:  Number of Hours: 6.75   Musculoskeletal: Strength & Muscle Tone: within normal limits Gait & Station: normal Patient leans: N/A  Current Medications: Current Facility-Administered Medications  Medication Dose Route Frequency Provider Last Rate Last Dose  . alum & mag hydroxide-simeth (MAALOX/MYLANTA) 200-200-20 MG/5ML suspension 30 mL  30 mL Oral Q4H PRN Laverle Hobby, PA-C      . aspirin tablet 325 mg  325 mg Oral Daily Laverle Hobby, PA-C   325 mg at 10/11/14 0900  . buPROPion [facility]) tablet 100 mg  100 mg Oral QAC breakfast Laverle Hobby, PA-C   100 mg at 10/11/14 0630  . feeding supplement (ENSURE COMPLETE) (ENSURE COMPLETE) liquid 237 mL  237 mL Oral TID BM Dorann Ou, RD   Stopped at 10/11/14 1407  . hydrocortisone cream 0.5 %   Topical TID PRN Elmarie Shiley, NP      . hydrOXYzine (ATARAX/VISTARIL) tablet 25 mg  25 mg Oral Q6H PRN Laverle Hobby, PA-C   25 mg at 10/09/14 2346  . ibuprofen (ADVIL,MOTRIN) tablet 600 mg  600 mg Oral Q6H PRN Laverle Hobby, PA-C      . magnesium hydroxide (MILK OF MAGNESIA) suspension 30 mL  30 mL Oral Daily PRN Laverle Hobby, PA-C      . neomycin-bacitracin-polymyxin (NEOSPORIN) ointment   Topical PRN Laverle Hobby, PA-C      . sulfamethoxazole-trimethoprim (BACTRIM DS,SEPTRA DS) 800-160 MG per tablet 1 tablet  1 tablet Oral Q12H Elmarie Shiley, NP   1 tablet at 10/11/14 0900  . traZODone (DESYREL) tablet 50 mg  50 mg Oral QHS,MR X 1 Laverle Hobby, PA-C   50 mg at 10/10/14 2112    Lab Results:  Results for orders placed or performed during the hospital encounter of 10/09/14 (from the past 48 hour(s))  TSH      Status: None   Collection Time: 10/11/14  6:40 AM  Result Value Ref Range   TSH 1.371 0.350 - 4.500 uIU/mL    Comment: Performed at [facility]    Physical Findings: AIMS: Facial and Oral Movements Muscles of Facial Expression: None, normal Lips and Perioral Area: None, normal Jaw: None, normal Tongue: None, normal,Extremity Movements Upper (arms, wrists, hands, fingers): None, normal Lower (legs, knees, ankles, toes): None, normal, Trunk Movements Neck, shoulders, hips: None, normal, Overall Severity Severity of abnormal movements (highest score from questions above): None, normal Incapacitation due to abnormal movements: None, normal Patient's awareness of abnormal movements (rate only patient's report): No Awareness, Dental Status Current problems with teeth and/or dentures?: No (no teeth) Does patient usually wear dentures?: Yes (don't have any)  CIWA:  CIWA-Ar Total: 1 COWS:  COWS Total Score: 4  Treatment Plan Summary: Daily contact with patient to assess and evaluate symptoms and progress in treatment Medication management  Plan: Supportive approach/copign skills/relapse prevention           Cocaine Dependence: reassess the mood instability that can come as a result of withdrawing               from cocaine. Explore medications for cocaine craving "off label" Already using Wellbutrin           Depression: resume the Wellbutrin and optimize response           Hallucinations: determine if cocaine induced and need for antipsychotic           Skin rash: evaluate further, use hydrocortisone cream for the time being Medical Decision Making Problem Points:  Established problem, worsening (2) and Review of psycho-social stressors (1) Data Points:  Review or order clinical lab tests (1) Review of medication regiment & side effects (2) Review of new medications or change in dosage (2)  I certify that inpatient services furnished can reasonably be expected to improve  the patient's condition.   [place] A 10/11/2014, 2:53 PM

## 2014-10-11 NOTE — Progress Notes (Signed)
Pt did not attend the [place] speaker meeting. Pt was in bed asleep.  Clint Bolder, NT 12:14 PM

## 2014-10-11 NOTE — Progress Notes (Signed)
[name], female   DOB: 1961/07/04, 54 y.o.   MRN: [id number] D: Client c/o of itching. A: Administered Diphenhydramine 58m, po. R: client reports relief.

## 2014-10-12 MED ORDER — BUPROPION HCL ER (XL) 150 MG PO TB24
150.0000 mg | ORAL_TABLET | Freq: Every day | ORAL | Status: DC
Start: 2014-10-13 — End: 2014-10-17
  Administered 2014-10-13 – 2014-10-17 (×5): 150 mg via ORAL
  Filled 2014-10-12 (×5): qty 1
  Filled 2014-10-12: qty 14
  Filled 2014-10-12 (×2): qty 1

## 2014-10-12 NOTE — Progress Notes (Signed)
Pt has been in her room much of the day.  She did go to group this morning but went back to bed to sleep through out the day.  She rated her depression and hopelessness a 8 and her anxiety a 6 on her self-inventory.  She denied any S/H ideation or A/V/H.  She did not make a goal for today.  Her main compliant was her itching from her "bed bug bites" her verbage and requested hydrocortisone cream at 1041.  She is hoping to go to [facility] from here.

## 2014-10-12 NOTE — BHH Group Notes (Signed)
[facility] LCSW Aftercare Discharge Planning Group Note  10/12/2014  8:45 AM  Participation Quality: Did Not Attend. Patient in bed, invited to attend but declined.  Tilden Fossa, MSW, [place] Worker [facility] [phone]

## 2014-10-12 NOTE — BHH Group Notes (Signed)
[facility] LCSW Group Therapy 10/12/2014 1:15 PM   Type of Therapy: Group Therapy  Participation Level: Did Not Attend. Patient asleep in bed, invited to attend but did not wake up.  Tilden Fossa, MSW, [place] Worker [facility] [phone]

## 2014-10-12 NOTE — Clinical Social Work Note (Addendum)
Patient declined at [facility] by Merry Proud due to not meeting criteria- mental health as primary diagnosis and did not meet severity level with usage.  Referral faxed to [facility] per patient request.  Tilden Fossa, MSW, [place] Worker [facility] [phone]

## 2014-10-12 NOTE — Progress Notes (Signed)
Patient did attend the evening karaoke group.  

## 2014-10-12 NOTE — Progress Notes (Signed)
[facility] MD Progress Note  10/12/2014 1:54 PM [name]  MRN:  [id number] Subjective:  Analysia hopes to be able to go to a residential treatment program. States if she was out there she would have had "a rock" by now. She states that she has plans for herself. Wants to get back with her family in [place] who she has not seen in years. States it is more important for her to get re connected with them now. States she is concerned about her health if she continues to use. She has not been to a residential treatment program in a long time. Sates she did well for a while but that she went back around the same old people and this triggered her relapse. She has not been able to abstain since then. Admits to cravings as well as the underlying depression Diagnosis:   DSM5: Substance/Addictive Disorders:  Cannabis Use Disorder - Moderate 9304.30), Cocaine Use Disorder Depressive Disorders:  Major Depressive Disorder - Moderate (296.22) Total Time spent with patient: 30 minutes  Axis I: Substance Induced Mood Disorder  ADL's:  Intact  Sleep: Fair  Appetite:  Fair Psychiatric Specialty Exam: Physical Exam  Review of Systems  Constitutional: Positive for malaise/fatigue.  HENT: Negative.   Eyes: Negative.   Respiratory: Negative.   Cardiovascular: Negative.   Gastrointestinal: Negative.   Genitourinary: Negative.   Musculoskeletal: Negative.   Skin: Positive for itching and rash.  Neurological: Negative.   Endo/Heme/Allergies: Negative.   Psychiatric/Behavioral: Positive for depression and substance abuse. The patient is nervous/anxious.     Blood pressure 111/67, pulse 73, temperature 98.2 F (36.8 C), temperature source Oral, resp. rate 18, height 5' 1.5" (1.562 m), weight 42.638 kg (94 lb).Body mass index is 17.48 kg/(m^2).  General Appearance: Fairly Groomed  Engineer, water::  Fair  Speech:  Clear and Coherent  Volume:  Decreased  Mood:  Anxious and Depressed  Affect:  anxious, worried, sad   Thought Process:  Coherent and Goal Directed  Orientation:  Full (Time, Place, and Person)  Thought Content:  her use, her inability so far of staying abstinence long term her need to go to rehab  Suicidal Thoughts:  No  Homicidal Thoughts:  No  Memory:  Immediate;   Fair Recent;   Fair Remote;   Fair  Judgement:  Fair  Insight:  Present and Shallow  Psychomotor Activity:  Restlessness  Concentration:  Fair  Recall:  [employer] of Knowledge:Fair  Language: Fair  Akathisia:  No  Handed:  Right  AIMS (if indicated):     Assets:  Desire for Improvement  Sleep:  Number of Hours: 6.5   Musculoskeletal: Strength & Muscle Tone: within normal limits Gait & Station: normal Patient leans: N/A  Current Medications: Current Facility-Administered Medications  Medication Dose Route Frequency Provider Last Rate Last Dose  . alum & mag hydroxide-simeth (MAALOX/MYLANTA) 200-200-20 MG/5ML suspension 30 mL  30 mL Oral Q4H PRN Laverle Hobby, PA-C      . aspirin tablet 325 mg  325 mg Oral Daily Laverle Hobby, PA-C   325 mg at 10/12/14 0843  . buPROPion [facility]) tablet 100 mg  100 mg Oral QAC breakfast Laverle Hobby, PA-C   100 mg at 10/12/14 0630  . feeding supplement (ENSURE COMPLETE) (ENSURE COMPLETE) liquid 237 mL  237 mL Oral TID BM Dorann Ou, RD   237 mL at 10/11/14 1456  . hydrocortisone cream 0.5 %   Topical TID PRN Elmarie Shiley, NP      .  hydrOXYzine (ATARAX/VISTARIL) tablet 25 mg  25 mg Oral Q6H PRN Laverle Hobby, PA-C   25 mg at 10/11/14 2150  . ibuprofen (ADVIL,MOTRIN) tablet 600 mg  600 mg Oral Q6H PRN Laverle Hobby, PA-C      . magnesium hydroxide (MILK OF MAGNESIA) suspension 30 mL  30 mL Oral Daily PRN Laverle Hobby, PA-C      . neomycin-bacitracin-polymyxin (NEOSPORIN) ointment   Topical PRN Laverle Hobby, PA-C   1 application at 12/82/08 1048  . sulfamethoxazole-trimethoprim (BACTRIM DS,SEPTRA DS) 800-160 MG per tablet 1 tablet  1 tablet Oral Q12H Elmarie Shiley, NP   1 tablet at 10/12/14 0843  . traZODone (DESYREL) tablet 50 mg  50 mg Oral QHS,MR X 1 Laverle Hobby, PA-C   50 mg at 10/11/14 2148    Lab Results:  Results for orders placed or performed during the hospital encounter of 10/09/14 (from the past 48 hour(s))  TSH     Status: None   Collection Time: 10/11/14  6:40 AM  Result Value Ref Range   TSH 1.371 0.350 - 4.500 uIU/mL    Comment: Performed at [facility]    Physical Findings: AIMS: Facial and Oral Movements Muscles of Facial Expression: None, normal Lips and Perioral Area: None, normal Jaw: None, normal Tongue: None, normal,Extremity Movements Upper (arms, wrists, hands, fingers): None, normal Lower (legs, knees, ankles, toes): None, normal, Trunk Movements Neck, shoulders, hips: None, normal, Overall Severity Severity of abnormal movements (highest score from questions above): None, normal Incapacitation due to abnormal movements: None, normal Patient's awareness of abnormal movements (rate only patient's report): No Awareness, Dental Status Current problems with teeth and/or dentures?: No (no teeth) Does patient usually wear dentures?: Yes (don't have any)  CIWA:  CIWA-Ar Total: 1 COWS:  COWS Total Score: 4  Treatment Plan Summary: Daily contact with patient to assess and evaluate symptoms and progress in treatment Medication management  Plan: Supportive approach/coping skills/relaps prevention           Cocaine Dependence: continue to work a relapse prevention plan, increase the Wellbutrin what can help with cravings           Major Depression: increase the Wellbutrin and reassess effectiveness  Medical Decision Making Problem Points:  Review of psycho-social stressors (1) Data Points:  Review of medication regiment & side effects (2)  I certify that inpatient services furnished can reasonably be expected to improve the patient's condition.   Donivin Wirt A 10/12/2014, 1:54 PM

## 2014-10-12 NOTE — Clinical Social Work Note (Signed)
Patient declined at [facility] per Melissa due to past legal charges.  Tilden Fossa, MSW, [place] Worker [facility] [phone]

## 2014-10-12 NOTE — Clinical Social Work Note (Signed)
CSW contacted [facility] to verify that referral had been received. Per staff, referral is currently being reviewed for possible admission. CSW will continue to follow.  Tilden Fossa, MSW, [place] Worker [facility] [phone]

## 2014-10-12 NOTE — Tx Team (Signed)
  Interdisciplinary Treatment Plan Update   Date Reviewed:  10/12/2014  Time Reviewed:  9:30 am  Progress in Treatment:   Attending groups: No Participating in groups: No Taking medication as prescribed: Yes  Tolerating medication: Yes Family/Significant other contact made: No Patient understands diagnosis: Yes AEB asking for help with addiction to cocaine Discussing patient identified problems/goals with staff: Yes  See initial care plan Medical problems stabilized or resolved: Yes Denies suicidal/homicidal ideation: Yes  In tx team Patient has not harmed self or others: Yes  For review of initial/current patient goals, please see plan of care.  Estimated Length of Stay:  3-4 days  Reason for Continuation of Hospitalization: Medication stabilization  Depression  New Problems/Goals identified:  N/A  Discharge Plan or Barriers:    1/7: Hopes to get into [facility] 1/8: Referral has been made to [facility] per patient request  Additional Comments:  [name] is an 54 y.o. Female who presented to [facility] with depression, anxiety, and cocaine use disorder, requesting help to get clean. Pt reports she is tired of her use and wants long term treatment to help her maintain sobriety. In the ED the patient reported suicidal thoughts to cut wrists and command hallucinations telling her to run into traffic. She also reports being estranged from her family for 17 years, and not taking care of herself as additional reason to seek treatment. Patient states during her psychiatric assessment "I keep doing drugs. I just smoke crack all day long. My friends give it to me for free. I'm so tired of it and want a change. I am ready to get off it. I'm not wanting to hurt myself today but the thought of suicide crosses my mind. I hear voices telling me to do stuff. All of this makes me very sad." The patient becomes very tearful during the interview process and appears depressed. Her urine drug screen is positive for  cocaine and marijuana. [name] verbalizes that the voices are worse when she is actively using cocaine and that today they are not bothering her. The patient is observed itching her legs and arms during the assessment. She reports there being a bedbug infestation where she had been staying with friends. Yesterday she was made to be a Do Not Admit because of her skin lesions. 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 She has also not been compliant with her prescription psychiatric medications due to her cocaine abuse. Patient reports following up with [facility] in the past but reported "I stopped going."   Attendees: Patient:    Family:    Physician: Dr. Parke Poisson; Dr. Sabra Heck 10/12/2014 9:30 AM  Nursing: Marolyn Haller , RN 10/12/2014 9:30 AM  Clinical Social Worker: Tilden Fossa, [place] 10/12/2014 9:30 AM  Other: Joette Catching, LCSW 10/12/2014 9:30 AM  Other: Lucinda Dell, [place] Liaison 10/12/2014 9:30 AM  Other: Lars Pinks, Case Manager 10/12/2014 9:30 AM  Other: Agustina Caroli, NP 10/12/2014 9:30 AM  Other: Elmarie Shiley, NP 10/12/2014 9:30 AM  Other: Norberto Sorenson, [place] 10/12/2014 9:30 AM  Other:     Scribe for Treatment Team:   Tilden Fossa, MSW, [place] Worker [facility] [phone]

## 2014-10-12 NOTE — Plan of Care (Signed)
Problem: Diagnosis: Increased Risk For Suicide Attempt Goal: STG-Patient Will Report Suicidal Feelings to Staff Outcome: Progressing Pt denied any suicidal ideation on her self-inventory and does verbally contract to come to staff before acting on any self-harm thoughts.

## 2014-10-12 NOTE — Plan of Care (Signed)
Problem: Alteration in mood; excessive anxiety as evidenced by: Goal: STG-Pt will report an absence of self-harm thoughts/actions (Patient will report an absence of self-harm thoughts or actions)  Outcome: Progressing Client currently denies suicidal ideation, agrees to contact staff if feeling should  reoccur.

## 2014-10-12 NOTE — [facility] Group Notes (Signed)
Adult Psychoeducational Group Note  Date:  10/13/2014 Time:  12:00 AM  Group Topic/Focus:  AA Meeting  Participation Level:  Did Not Attend  Participation Quality:  None  Affect:  None  Cognitive:  None  Insight: None  Engagement in Group:  None  Modes of Intervention:  Discussion and Education  Additional Comments:  [name] did not attend group.  [name] A 10/13/2014, 12:00 AM

## 2014-10-13 NOTE — Plan of Care (Signed)
Problem: Diagnosis: Increased Risk For Suicide Attempt Goal: STG-Patient Will Attend All Groups On The Unit Outcome: Not Progressing Pt did not attend evening group on 10/12/14.

## 2014-10-13 NOTE — Progress Notes (Signed)
D) Pt has been attending the groups and interacting with her peers. Denies SI and HI. States that she is feeling better overall and would like long term treatment. Has participated and engaged fully in the activities in the dayroom. Rates her depression at an 8, her hopelessness at a 1 and her anxiety at a 1. A) Given support, reassurance and praise. Encouragement given. Provided with a 1:1. Therapeutic humor used. R) Pt denies SI and HI.

## 2014-10-13 NOTE — Progress Notes (Signed)
D: Pt has blunted affect and depressed mood.  Pt reports she "went to some groups" today.  She reports "we worked on coping mechanisms, played with the ball."  Pt reports she is "doing better, getting up going to groups now."  Pt reports she will be going to treatment center after discharge and that she would like to go to [facility].  She reports she feels she needs a 3 month program instead of a 30 day program to prevent relapse.  Pt interacts appropriately with staff and peers.  She denies SI/HI, denies hallucinations.  She attended evening group.   A: Medications administered per order.  Encouraged and supported pt.  Safety maintained.  PRN medication administered for anxiety, see flowsheet.   R: Pt is compliant with medications.  She verbally contracts for safety.  Pt reports she will notify writer of needs and concerns.  Will continue to monitor and assess for safety.

## 2014-10-13 NOTE — Progress Notes (Signed)
[facility] MD Progress Note  10/13/2014 5:59 PM AMAI CAPPIELLO  MRN:  [id number] Subjective:  Cristol hopes to be able to go to a residential treatment program. States if she was out there she would have had "a rock" by now. She states that she has plans for herself. Wants to get back with her family in [place] who she has not seen in years. States it is more important for her to get re connected with them now. States she is concerned about her health if she continues to use. She has not been to a residential treatment program in a long time. Sates she did well for a while but that she went back around the same old people and this triggered her relapse. She has not been able to abstain since then. Admits to cravings for cocaine. Mood is better. Bed bugs impoving.  Diagnosis:   DSM5: Substance/Addictive Disorders:  Cannabis Use Disorder - Moderate 9304.30), Cocaine Use Disorder Depressive Disorders:  Major Depressive Disorder - Moderate (296.22) Total Time spent with patient: 30 minutes  Axis I: Substance Induced Mood Disorder  ADL's:  Intact  Sleep: Fair  Appetite:  Fair Psychiatric Specialty Exam: Physical Exam  Review of Systems  Constitutional: Positive for malaise/fatigue.  HENT: Negative.   Eyes: Negative.   Respiratory: Negative.   Cardiovascular: Negative.   Gastrointestinal: Negative.   Genitourinary: Negative.   Musculoskeletal: Negative.   Skin: Positive for itching and rash.  Neurological: Negative.   Endo/Heme/Allergies: Negative.   Psychiatric/Behavioral: Positive for depression and substance abuse. The patient is nervous/anxious.     Blood pressure 111/59, pulse 61, temperature 98 F (36.7 C), temperature source Oral, resp. rate 17, height 5' 1.5" (1.562 m), weight 42.638 kg (94 lb).Body mass index is 17.48 kg/(m^2).  General Appearance: Fairly Groomed  Engineer, water::  Fair  Speech:  Clear and Coherent  Volume:  Normal  Mood:  Euthymic  Affect:  Congruent and Constricted   Thought Process:  Coherent and Goal Directed  Orientation:  Full (Time, Place, and Person)  Thought Content:  her use, her inability so far of staying abstinence long term her need to go to rehab. No AVH/paranoia.  Suicidal Thoughts:  No  Homicidal Thoughts:  No  Memory:  Immediate;   Fair Recent;   Fair Remote;   Fair  Judgement:  Fair  Insight:  Present and Shallow  Psychomotor Activity:  Restlessness  Concentration:  Fair  Recall:  [employer] of Knowledge:Fair  Language: Fair  Akathisia:  No  Handed:  Right  AIMS (if indicated):     Assets:  Desire for Improvement  Sleep:  Number of Hours: 6.75   Musculoskeletal: Strength & Muscle Tone: within normal limits Gait & Station: normal Patient leans: N/A  Current Medications: Current Facility-Administered Medications  Medication Dose Route Frequency Provider Last Rate Last Dose  . alum & mag hydroxide-simeth (MAALOX/MYLANTA) 200-200-20 MG/5ML suspension 30 mL  30 mL Oral Q4H PRN Laverle Hobby, PA-C      . aspirin tablet 325 mg  325 mg Oral Daily Laverle Hobby, PA-C   325 mg at 10/13/14 0973  . buPROPion (WELLBUTRIN XL) 24 hr tablet 150 mg  150 mg Oral Daily Nicholaus Bloom, MD   150 mg at 10/13/14 5329  . feeding supplement (ENSURE COMPLETE) (ENSURE COMPLETE) liquid 237 mL  237 mL Oral TID BM Dorann Ou, RD   237 mL at 10/11/14 1456  . hydrocortisone cream 0.5 %   Topical  TID PRN Elmarie Shiley, NP      . hydrOXYzine (ATARAX/VISTARIL) tablet 25 mg  25 mg Oral Q6H PRN Laverle Hobby, PA-C   25 mg at 10/13/14 0835  . ibuprofen (ADVIL,MOTRIN) tablet 600 mg  600 mg Oral Q6H PRN Laverle Hobby, PA-C   600 mg at 10/13/14 1307  . magnesium hydroxide (MILK OF MAGNESIA) suspension 30 mL  30 mL Oral Daily PRN Laverle Hobby, PA-C      . neomycin-bacitracin-polymyxin (NEOSPORIN) ointment   Topical PRN Laverle Hobby, PA-C   1 application at 45/36/46 1048  . traZODone (DESYREL) tablet 50 mg  50 mg Oral QHS,MR X 1 Laverle Hobby,  PA-C   50 mg at 10/12/14 2102    Lab Results:  No results found for this or any previous visit (from the past 48 hour(s)).  Physical Findings: AIMS: Facial and Oral Movements Muscles of Facial Expression: None, normal Lips and Perioral Area: None, normal Jaw: None, normal Tongue: None, normal,Extremity Movements Upper (arms, wrists, hands, fingers): None, normal Lower (legs, knees, ankles, toes): None, normal, Trunk Movements Neck, shoulders, hips: None, normal, Overall Severity Severity of abnormal movements (highest score from questions above): None, normal Incapacitation due to abnormal movements: None, normal Patient's awareness of abnormal movements (rate only patient's report): No Awareness, Dental Status Current problems with teeth and/or dentures?: No Does patient usually wear dentures?: No  CIWA:  CIWA-Ar Total: 1 COWS:  COWS Total Score: 4  Treatment Plan Summary: Daily contact with patient to assess and evaluate symptoms and progress in treatment Medication management  Plan: Supportive approach/coping skills/relapse prevention           Cocaine Dependence: continue to work a relapse prevention plan, continue Wellbutrin which can help with cravings           Major Depression: continue Wellbutrin and reassess effectiveness  Medical Decision Making Problem Points:  Review of psycho-social stressors (1) Data Points:  Review of medication regiment & side effects (2)  I certify that inpatient services furnished can reasonably be expected to improve the patient's condition.   Dereck Leep 10/13/2014, 5:59 PM

## 2014-10-13 NOTE — Plan of Care (Signed)
Problem: Alteration in mood & ability to function due to Goal: LTG-Pt reports reduction in suicidal thoughts (Patient reports reduction in suicidal thoughts and is able to verbalize a safety plan for whenever patient is feeling suicidal)  Outcome: Progressing Pt denied SI this shift.  She verbally contracted for safety.

## 2014-10-13 NOTE — Progress Notes (Signed)
D: Pt has blunted affect and depressed mood.  Pt reports her goal for the day was to "sleep."  Pt reports "I slept good today."  Pt denies SI/HI, denies hallucinations.  Pt complains of itching.  She did not attend evening group.  Pt interacts appropriately with staff and peers.   A: Medications administered per order.  Pt refused scheduled ensure.  Pt received PRN medication for anxiety and itching, see flowsheet.  Safety maintained.  Met with pt 1:1 and offered support and encouragement.  R: Pt verbally contracts for safety.  She reported that she will notify staff of needs and concerns.  Will continue to monitor and assess for safety.

## 2014-10-13 NOTE — Progress Notes (Signed)
Psychoeducational Group Note  Date: 10/13/2014 Time:  1015  Group Topic/Focus:  Identifying Needs:   The focus of this group is to help patients identify their personal needs that have been historically problematic and identify healthy behaviors to address their needs.  Participation Level:  Active  Participation Quality:  Appropriate  Affect:  Appropriate  Cognitive:  Oriented  Insight:  Improving  Engagement in Group:  Engaged  Additional Comments:  Pt participated and was involved in the discussion.  [name]

## 2014-10-13 NOTE — Progress Notes (Signed)
.  Psychoeducational Group Note    Date: 10/13/2014 Time: 0930   Goal Setting Purpose of Group: To be able to set a goal that is measurable and that can be accomplished in one day Participation Level:  Active  Participation Quality:  Appropriate  Affect:  Appropriate  Cognitive:  Oriented  Insight:  Improving  Engagement in Group:  Engaged  Additional Comments:  Pt was engaged and involved in the group. Participation good.  Paulino Rily

## 2014-10-14 MED ORDER — DIPHENHYDRAMINE HCL 25 MG PO CAPS
50.0000 mg | ORAL_CAPSULE | Freq: Four times a day (QID) | ORAL | Status: DC | PRN
  Administered 2014-10-14 – 2014-10-17 (×8): 50 mg via ORAL
  Filled 2014-10-14 (×8): qty 2

## 2014-10-14 NOTE — Progress Notes (Signed)
Psychoeducational Group Note  Date:  10/14/2014 Time:  1015  Group Topic/Focus:  Making Healthy Choices:   The focus of this group is to help patients identify negative/unhealthy choices they were using prior to admission and identify positive/healthier coping strategies to replace them upon discharge.  Participation Level:  Active  Participation Quality:  Appropriate  Affect:  Appropriate  Cognitive:  Alert  Insight:  Improving  Engagement in Group:  Engaged  Additional Comments:  Pt participated and was focused on the topics presented. Gaining insight and looking into her behavior  Paulino Rily 10/14/2014

## 2014-10-14 NOTE — [facility] Group Notes (Signed)
[place] LCSW Group Therapy  10/14/2014   1:00 PM   Type of Therapy:  Group Therapy  Participation Level:  Active  Participation Quality:  Appropriate and Attentive  Affect:  Appropriate, Tearful, Depressed  Cognitive:  Alert and Appropriate  Insight:  Developing/Improving and Engaged  Engagement in Therapy:  Developing/Improving and Engaged  Modes of Intervention:  Clarification, Confrontation, Discussion, Education, Exploration, Limit-setting, Orientation, Problem-solving, Rapport Building, Art therapist, Socialization and Support  Summary of Progress/Problems: The main focus of today's process group was to identify the patient's current support system and decide on other supports that can be put in place.  An emphasis was placed on using counselor, doctor, therapy groups, 12-step groups, and problem-specific support groups to expand supports, as well as doing something different than has been done before.  Pt discussed losing contact with all of her family after doing 15 years in prison and still not knowing where any of them are.  Pt discussed having no support here after burning bridges, but feels she will feel better knowing how her family is doing and where they are.  Pt was helpful to a peer on using the bus system.    This morning pt asked CSW how she can get into [facility], [facility] or [facility] as she feels she's always limited on resources.  In group pt told CSW she's not changed her mind and doesn't want treatment, as she feels finding her family is more important.     [name], LCSW 10/14/2014 3:27 PM

## 2014-10-14 NOTE — [facility] Group Notes (Signed)
[place] Group Notes:  (Nursing/MHT/Case Management/Adjunct)  Date:  10/14/2014  Time:  3:08 PM  Type of Therapy:  Psychoeducational Skills  Participation Level:  Active  Participation Quality:  Appropriate  Affect:  Appropriate  Cognitive:  Appropriate  Insight:  Appropriate  Engagement in Group:  Engaged  Modes of Intervention:  Discussion  Summary of Progress/Problems: Pt did attend self inventory group, pt reported that she was negative SI/HI, no AH/VH noted. Pt rated her depression as a 0, and her helplessness/hopelessness as a 0.     Pt reported no issues or concerns.   Benancio Deeds Shanta 10/14/2014, 3:08 PM

## 2014-10-14 NOTE — Progress Notes (Signed)
[facility] MD Progress Note  10/14/2014 4:22 PM [name]  MRN:  [id number] Subjective:  [name] hopes to be able to go to a residential treatment program. Cravings improved. She states that she has plans for herself. Wants to get back with her family in [place] who she has not seen in years. States it is more important for her to get re connected with them now. States she is concerned about her health if she continues to use. She has not been to a residential treatment program in a long time. Sates she did well for a while but that she went back around the same old people and this triggered her relapse. She has not been able to abstain since then. Mood is better. Bed bugs impoving.  Diagnosis:   DSM5: Substance/Addictive Disorders:  Cannabis Use Disorder - Moderate 9304.30), Cocaine Use Disorder Depressive Disorders:  Major Depressive Disorder - Moderate (296.22) Total Time spent with patient: 15 minutes  Axis I: Substance Induced Mood Disorder  ADL's:  Intact  Sleep: Fair  Appetite:  Fair Psychiatric Specialty Exam: Physical Exam  Review of Systems  Constitutional: Positive for malaise/fatigue.  HENT: Negative.   Eyes: Negative.   Respiratory: Negative.   Cardiovascular: Negative.   Gastrointestinal: Negative.   Genitourinary: Negative.   Musculoskeletal: Negative.   Skin: Positive for itching and rash.  Neurological: Negative.   Endo/Heme/Allergies: Negative.   Psychiatric/Behavioral: Positive for depression and substance abuse. The patient is nervous/anxious.     Blood pressure 118/78, pulse 55, temperature 97.6 F (36.4 C), temperature source Oral, resp. rate 16, height 5' 1.5" (1.562 m), weight 42.638 kg (94 lb).Body mass index is 17.48 kg/(m^2).  General Appearance: Fairly Groomed  Engineer, water::  Fair  Speech:  Clear and Coherent  Volume:  Normal  Mood:  Euthymic  Affect:  Congruent and Constricted  Thought Process:  Coherent and Goal Directed  Orientation:  Full (Time,  Place, and Person)  Thought Content:  her use, her inability so far of staying abstinence long term her need to go to rehab. No AVH/paranoia.  Suicidal Thoughts:  No  Homicidal Thoughts:  No  Memory:  Immediate;   Fair Recent;   Fair Remote;   Fair  Judgement:  Fair  Insight:  Present and Shallow  Psychomotor Activity:  Restlessness  Concentration:  Fair  Recall:  [employer] of Knowledge:Fair  Language: Fair  Akathisia:  No  Handed:  Right  AIMS (if indicated):     Assets:  Desire for Improvement  Sleep:  Number of Hours: 6.5   Musculoskeletal: Strength & Muscle Tone: within normal limits Gait & Station: normal Patient leans: N/A  Current Medications: Current Facility-Administered Medications  Medication Dose Route Frequency Provider Last Rate Last Dose  . alum & mag hydroxide-simeth (MAALOX/MYLANTA) 200-200-20 MG/5ML suspension 30 mL  30 mL Oral Q4H PRN Laverle Hobby, PA-C      . aspirin tablet 325 mg  325 mg Oral Daily Laverle Hobby, PA-C   325 mg at 10/14/14 0931  . buPROPion (WELLBUTRIN XL) 24 hr tablet 150 mg  150 mg Oral Daily Nicholaus Bloom, MD   150 mg at 10/14/14 0931  . feeding supplement (ENSURE COMPLETE) (ENSURE COMPLETE) liquid 237 mL  237 mL Oral TID BM Dorann Ou, RD   237 mL at 10/11/14 1456  . hydrocortisone cream 0.5 %   Topical TID PRN Elmarie Shiley, NP      . hydrOXYzine (ATARAX/VISTARIL) tablet 25 mg  25 mg Oral Q6H PRN Laverle Hobby, PA-C   25 mg at 10/14/14 1533  . ibuprofen (ADVIL,MOTRIN) tablet 600 mg  600 mg Oral Q6H PRN Laverle Hobby, PA-C   600 mg at 10/13/14 1307  . magnesium hydroxide (MILK OF MAGNESIA) suspension 30 mL  30 mL Oral Daily PRN Laverle Hobby, PA-C      . neomycin-bacitracin-polymyxin (NEOSPORIN) ointment   Topical PRN Laverle Hobby, PA-C   1 application at 49/96/92 1531  . traZODone (DESYREL) tablet 50 mg  50 mg Oral QHS,MR X 1 Laverle Hobby, PA-C   50 mg at 10/13/14 2134    Lab Results:  No results found for this  or any previous visit (from the past 48 hour(s)).  Physical Findings: AIMS: Facial and Oral Movements Muscles of Facial Expression: None, normal Lips and Perioral Area: None, normal Jaw: None, normal Tongue: None, normal,Extremity Movements Upper (arms, wrists, hands, fingers): None, normal Lower (legs, knees, ankles, toes): None, normal, Trunk Movements Neck, shoulders, hips: None, normal, Overall Severity Severity of abnormal movements (highest score from questions above): None, normal Incapacitation due to abnormal movements: None, normal Patient's awareness of abnormal movements (rate only patient's report): No Awareness, Dental Status Current problems with teeth and/or dentures?: No Does patient usually wear dentures?: No  CIWA:  CIWA-Ar Total: 1 COWS:  COWS Total Score: 4  Treatment Plan Summary: Daily contact with patient to assess and evaluate symptoms and progress in treatment Medication management  Plan: Supportive approach/coping skills/relapse prevention           Cocaine Dependence: continue to work a relapse prevention plan, continue Wellbutrin which can help with cravings           Major Depression: continue Wellbutrin and reassess effectiveness Will start benadryl for itching.  Medical Decision Making Problem Points:  Review of psycho-social stressors (1) Data Points:  Review of medication regiment & side effects (2)  I certify that inpatient services furnished can reasonably be expected to improve the patient's condition.   Dereck Leep 10/14/2014, 4:22 PM

## 2014-10-14 NOTE — Plan of Care (Signed)
Problem: Ineffective individual coping Goal: LTG: Patient will report a decrease in negative feelings Outcome: Progressing Pt rates her depression and hopelessness both at a 1 out of 10. Affect is brighter and interactions increasing.

## 2014-10-14 NOTE — Progress Notes (Signed)
Patient did attend the evening speaker AA meeting.  

## 2014-10-14 NOTE — Progress Notes (Signed)
D) Pt has been attending the program and interacting with her peers. Rates her depression, hopelessness and helplessness all at a 1. States "I want to go to a long term treatment center. I am really worried about my health. This is the best I have felt in a long time and I would like to keep it this way". A) Given support, reassurance and praise. Encouragement given

## 2014-10-15 DIAGNOSIS — F332 Major depressive disorder, recurrent severe without psychotic features: Principal | ICD-10-CM

## 2014-10-15 NOTE — Clinical Social Work Note (Signed)
CSW spoke with [facility] admissions Coordinator [place] who reports that patient is not appropriate for admission to [facility] due to cocaine use only.  Tilden Fossa, MSW, [place] Worker [facility] [phone]

## 2014-10-15 NOTE — Plan of Care (Signed)
Problem: Consults Goal: Depression Patient Education See Patient Education Module for education specifics.  Outcome: Completed/Met Date Met:  10/15/14 Nurse discussed depression with patient.        

## 2014-10-15 NOTE — BHH Group Notes (Signed)
   [facility] LCSW Aftercare Discharge Planning Group Note  10/15/2014  8:45 AM   Participation Quality: Alert, Appropriate and Oriented  Mood/Affect: Appropriate  Depression Rating: 0  Anxiety Rating: 3  Thoughts of Suicide: Pt denies SI/HI  Will you contract for safety? Yes  Current AVH: Pt denies  Plan for Discharge/Comments: Pt attended discharge planning group and actively participated in group. CSW provided pt with today's workbook. Patient reports that she continues to be interested in [place]. She is unsure of where she will stay if she is not admitted to [facility]. Patient inquired about reasoning behind [facility] and [facility] acceptance denials- CSW offered to meet with patient individually to discuss after group.  Transportation Means: CSW assessing.  Supports: No supports mentioned at this time  Tilden Fossa, MSW, [place] Worker [facility] [phone]

## 2014-10-15 NOTE — BHH Group Notes (Signed)
[place] LCSW Group Therapy 10/15/2014  1:15 pm  Type of Therapy: Group Therapy Participation Level: Active  Participation Quality: Attentive, Sharing and Supportive  Affect: Appropriate  Cognitive: Alert and Oriented  Insight: Developing/Improving and Engaged  Engagement in Therapy: Developing/Improving and Engaged  Modes of Intervention: Clarification, Confrontation, Discussion, Education, Exploration,  Limit-setting, Orientation, Problem-solving, Rapport Building, Art therapist, Socialization and Support  Summary of Progress/Problems: Pt identified obstacles faced currently and processed barriers involved in overcoming these obstacles. Pt identified steps necessary for overcoming these obstacles and explored motivation (internal and external) for facing these difficulties head on. Pt further identified one area of concern in their lives and chose a goal to focus on for today. Patient actively listened during group but did not participate in discussion.  Tilden Fossa, MSW, [place] Worker [facility] [phone]

## 2014-10-15 NOTE — Clinical Social Work Note (Signed)
Patient informed CSW that she can stay with a friend at discharge and that it is a safe, supportive environment. Patient reports that she would like to follow up with [facility] at discharge.   Tilden Fossa, MSW, [place] Worker [facility] [phone]

## 2014-10-15 NOTE — Progress Notes (Signed)
D:  Patient's self inventory sheet, patient sleeps good, sleep medication is helpful.  Good appetite, normal energy level, good concentration.  Rated depression, hopeless and anxiety #3.  Patient stated she still has a runny nose from withdrawals.  Physical problems leg pain and rash.  Worst pain #8, legs/arms.  Does have discharge plans.  Being homeless would cause problems with her discharge plan.   A:  Medications administered per MD orders.  Emotional support and encouragement given patient. R:  Denied SI and HI.  Denied A/V hallucinations.  Safety maintained with 15 minute checks.

## 2014-10-15 NOTE — Progress Notes (Signed)
[facility] MD Progress Note  10/15/2014 3:31 PM [name]  MRN:  [id number] Subjective:  [name] states that she is having a hard time. Presents herself tearful as she was denied admission to both [facility] and [facility]. States that it is because of something that happened years ago. States she needs more treatment. Admits she is afraid of relapsing. States she does not want to get back to using as states she does not think she will make if she relapsed.  Diagnosis:   DSM5: Substance/Addictive Disorders:  Cocaine related disorder severe Depressive Disorders:  Major Depressive Disorder - Severe (296.23) Total Time spent with patient: 30 minutes  Axis I: Substance Induced Mood Disorder  ADL's:  Intact  Sleep: Fair  Appetite:  Fair   Psychiatric Specialty Exam: Physical Exam  Review of Systems  Constitutional: Positive for malaise/fatigue.  HENT: Negative.   Eyes: Negative.   Respiratory: Negative.   Cardiovascular: Negative.   Gastrointestinal: Negative.   Genitourinary: Negative.   Musculoskeletal: Negative.   Skin: Positive for itching.  Neurological: Negative.   Endo/Heme/Allergies: Negative.   Psychiatric/Behavioral: Positive for depression and substance abuse. The patient is nervous/anxious.     Blood pressure 116/78, pulse 73, temperature 98 F (36.7 C), temperature source Oral, resp. rate 16, height 5' 1.5" (1.562 m), weight 42.638 kg (94 lb).Body mass index is 17.48 kg/(m^2).  General Appearance: Disheveled  Eye Sport and exercise psychologist::  Fair  Speech:  Clear and Coherent  Volume:  Decreased  Mood:  Anxious, Depressed, Dysphoric, Hopeless and Worthless  Affect:  Depressed, Tearful and anxious worried  Thought Process:  Coherent and Goal Directed  Orientation:  Full (Time, Place, and Person)  Thought Content:  symptoms fears worries concerns about relapsing feeling hopeless  Suicidal Thoughts:  No  Homicidal Thoughts:  No  Memory:  Immediate;   Fair Recent;   Fair Remote;   Fair   Judgement:  Fair  Insight:  Present and Shallow  Psychomotor Activity:  Restlessness  Concentration:  Fair  Recall:  [employer] of Knowledge:Poor  Language: Fair  Akathisia:  No  Handed:  Right  AIMS (if indicated):     Assets:  Desire for Improvement  Sleep:  Number of Hours: 6.5   Musculoskeletal: Strength & Muscle Tone: within normal limits Gait & Station: normal Patient leans: N/A  Current Medications: Current Facility-Administered Medications  Medication Dose Route Frequency Provider Last Rate Last Dose  . alum & mag hydroxide-simeth (MAALOX/MYLANTA) 200-200-20 MG/5ML suspension 30 mL  30 mL Oral Q4H PRN Laverle Hobby, PA-C      . aspirin tablet 325 mg  325 mg Oral Daily Laverle Hobby, PA-C   325 mg at 10/15/14 0816  . buPROPion (WELLBUTRIN XL) 24 hr tablet 150 mg  150 mg Oral Daily Nicholaus Bloom, MD   150 mg at 10/15/14 0816  . diphenhydrAMINE (BENADRYL) capsule 50 mg  50 mg Oral Q6H PRN Skip Estimable, MD   50 mg at 10/15/14 0818  . feeding supplement (ENSURE COMPLETE) (ENSURE COMPLETE) liquid 237 mL  237 mL Oral TID BM Dorann Ou, RD   237 mL at 10/15/14 1051  . hydrocortisone cream 0.5 %   Topical TID PRN Elmarie Shiley, NP      . ibuprofen (ADVIL,MOTRIN) tablet 600 mg  600 mg Oral Q6H PRN Laverle Hobby, PA-C   600 mg at 10/15/14 0488  . magnesium hydroxide (MILK OF MAGNESIA) suspension 30 mL  30 mL Oral Daily PRN Maurine Minister  Simon, PA-C      . neomycin-bacitracin-polymyxin (NEOSPORIN) ointment   Topical PRN Laverle Hobby, PA-C      . traZODone (DESYREL) tablet 50 mg  50 mg Oral QHS,MR X 1 Laverle Hobby, PA-C   50 mg at 10/14/14 2109    Lab Results: No results found for this or any previous visit (from the past 48 hour(s)).  Physical Findings: AIMS: Facial and Oral Movements Muscles of Facial Expression: None, normal Lips and Perioral Area: None, normal Jaw: None, normal Tongue: None, normal,Extremity Movements Upper (arms, wrists, hands, fingers):  None, normal Lower (legs, knees, ankles, toes): None, normal, Trunk Movements Neck, shoulders, hips: None, normal, Overall Severity Severity of abnormal movements (highest score from questions above): None, normal Incapacitation due to abnormal movements: None, normal Patient's awareness of abnormal movements (rate only patient's report): No Awareness, Dental Status Current problems with teeth and/or dentures?: No Does patient usually wear dentures?: No  CIWA:  CIWA-Ar Total: 1 COWS:  COWS Total Score: 1  Treatment Plan Summary: Daily contact with patient to assess and evaluate symptoms and progress in treatment Medication management  Plan: Supportive approach/coping skills/relapse prevention           Depression : Will increase the Wellbutrin to XL 300 mg in AM           Will instal hope/explore other treatment options  Medical Decision Making Problem Points:  Established problem, worsening (2) and Review of psycho-social stressors (1) Data Points:  Review of new medications or change in dosage (2)  I certify that inpatient services furnished can reasonably be expected to improve the patient's condition.   Adrie Picking A 10/15/2014, 3:31 PM

## 2014-10-15 NOTE — Plan of Care (Signed)
Problem: Ineffective individual coping Goal: STG: Patient will remain free from self harm Outcome: Progressing Pt has not harmed herself this shift.  She verbally contracted for safety and denied thoughts of self-harm.

## 2014-10-15 NOTE — Progress Notes (Signed)
D: Pt has blunted affect and depressed mood.  Pt reports "I think what's going to happen is I'm probably going to leave tomorrow so I can go to [place] to see my family.  I haven't seen them in 17 years.  I think after I find my family, I'll be all right."  Writer asked pt if she still planned to continue treatment after leaving [facility].  Pt then said she does still wants to "do [facility]."  She said she feels like [facility] would be good for her because "I wouldn't be able to leave.  It's locked.  If I went to a place like [facility], I could leave."  Pt denies SI/HI, denies hallucinations.  She is cooperative with staff and interacts with peers appropriately.  Pt attended evening group.   A: Medications administered per order.  PRN medication administered for itching, see flowsheet.  Safety maintained.  Actively listened to pt and provided support and encouragement.   R: Pt is compliant with medications.  She verbally contracts for safety.  Will continue to monitor and assess for safety.

## 2014-10-16 DIAGNOSIS — F332 Major depressive disorder, recurrent severe without psychotic features: Secondary | ICD-10-CM | POA: Diagnosis present

## 2014-10-16 MED ORDER — HYDROXYZINE HCL 25 MG PO TABS
25.0000 mg | ORAL_TABLET | Freq: Four times a day (QID) | ORAL | Status: DC | PRN
  Administered 2014-10-16 – 2014-10-17 (×2): 25 mg via ORAL
  Filled 2014-10-16 (×2): qty 1
  Filled 2014-10-16: qty 20

## 2014-10-16 NOTE — BHH Group Notes (Signed)
The focus of this group is to educate the patient on the purpose and policies of crisis stabilization and provide a format to answer questions about their admission.  The group details unit policies and expectations of patients while admitted.  Patient actively participated in 0900 nurse education orientation group this morning.  Patient had appropriate affect, alert, appropriate insiight and engagement.  Today patient will work on 3 goals for discharge.

## 2014-10-16 NOTE — Plan of Care (Signed)
Problem: Consults Goal: Anxiety Disorder Patient Education See Patient Education Module for eduction specifics.  Outcome: Completed/Met Date Met:  10/16/14 Nurse discussed anxiety and stress with patient.

## 2014-10-16 NOTE — Clinical Social Work Note (Signed)
CSW spoke with patient's friend Virgina Jock [phone] who reports that patient has a place to stay at discharge and confirmed that she will pick patient up at around 1:15 pm tomorrow.  Tilden Fossa, MSW, [place] Worker [facility] [phone]

## 2014-10-16 NOTE — Progress Notes (Signed)
Recreation Therapy Notes  Animal-Assisted Activity/Therapy (AAA/T) Program Checklist/Progress Notes Patient Eligibility Criteria Checklist & Daily Group note for Rec Tx Intervention  Date: [id number] Time: 2:45pm Location: 6 [employer]    AAA/T Program Assumption of Risk Form signed by Patient/ or Parent Legal Guardian yes  Patient is free of allergies or sever asthma yes  Patient reports no fear of animals yes  Patient reports no history of cruelty to animals yes  Patient understands his/her participation is voluntary yes  Patient washes hands before animal contact yes  Patient washes hands after animal contact yes  Behavioral Response: Did not attend.   Laureen Ochs Enrique Weiss, LRT/CTRS  Lane Hacker 10/16/2014 4:13 PM

## 2014-10-16 NOTE — Progress Notes (Signed)
[name], female   DOB: Jul 30, 1961, 54 y.o.   MRN: [id number] D: Client visible on the unit, interacting with peers, requesting medication for itching, but when staff is not looking client is not scratching. Client is very attention seeking.  A: Writer reviews medications available, administered as ordered. Staff will monitor q1mn for safety. R: Client is safe on the unit.

## 2014-10-16 NOTE — Progress Notes (Addendum)
D: Pt denies SI/HI/AVH. Pt is pleasant and cooperative. Pt seen laughing and joking on unit. Pt in better spirits, plans to follow up with [facility] and is happy she has a place to go to live, because the prior place had Bed Bugs.  A: Pt was offered support and encouragement. Pt was given scheduled medications. Pt was encourage to attend groups. Q 15 minute checks were done for safety.   R:Pt attends groups and interacts well with peers and staff. Pt is taking medication. Pt has no complaints at this time .Pt receptive to treatment and safety maintained on unit.

## 2014-10-16 NOTE — Progress Notes (Signed)
D:  Patient's self inventory sheet, patient slept good last night, sleep medication was helpful.  Good appetite, normal energy level, good concentration.  Rated depression, hopeless and anxiety #2.  Denied withdrawals.  Denied SI.  Physical problems today pain, rash.  Worst pain #5, pain medication is helpful.  No problems anticipated after discharge. A:  Medications administered per MD orders.  Emotional support and encouragement given patient. R: Denied SI and HI, contracts for safety.  Denied A/V hallucinations.  Safety maintained with 15 minute checks.

## 2014-10-16 NOTE — Progress Notes (Signed)
Pt attended spiritual care group on grief and loss facilitated by Counseling intern Martinique Austin and chaplain Jerene Pitch. Group opened with brief discussion and psycho-social ed around grief and loss in relationships and in relation to self - identifying life patterns, circumstances, changes that cause losses. Established group norm of speaking from own life experience. Group goal of establishing open and affirming space for members to share loss and experience with grief, normalize grief experience and provide psycho social education and grief support.  Group drew on narrative and Alderian therapeutic modalities.   [name] was present in room throughout group, with exception of leaving for treatment team.  She was completing a puzzle through group.  Did not disturb group process, engagement with group was limited.   [place], [place]

## 2014-10-16 NOTE — Tx Team (Signed)
Interdisciplinary Treatment Plan Update   Date Reviewed:  10/16/2014  Time Reviewed:  9:30 am  Progress in Treatment:   Attending groups: Yes Participating in groups: Yes Taking medication as prescribed: Yes  Tolerating medication: Yes Family/Significant other contact made: No, patient has declined for CSW to make collateral contact Patient understands diagnosis: Yes AEB asking for help with addiction to cocaine Discussing patient identified problems/goals with staff: Yes  See initial care plan Medical problems stabilized or resolved: Yes Denies suicidal/homicidal ideation: Yes  In tx team Patient has not harmed self or others: Yes  For review of initial/current patient goals, please see plan of care.  Estimated Length of Stay:  Discharge anticipated for tomorrow 10/17/14.  Reason for Continuation of Hospitalization: Medication stabilization  Depression  New Problems/Goals identified:  N/A  Discharge Plan or Barriers:    1/7: Hopes to get into [facility] 1/8: Referral has been made to [facility] per patient request 1/12: Patient declined at [facility], [facility], and [facility]; Patient plans to discharge home with friend or to the shelter to follow up with outpatient services. Patient has also been offered information on [facility] as well as [facility]- patient declined.  Additional Comments:  [name] is an 54 y.o. Female who presented to [facility] with depression, anxiety, and cocaine use disorder, requesting help to get clean. Pt reports she is tired of her use and wants long term treatment to help her maintain sobriety. In the ED the patient reported suicidal thoughts to cut wrists and command hallucinations telling her to run into traffic. She also reports being estranged from her family for 17 years, and not taking care of herself as additional reason to seek treatment. Patient states during her psychiatric assessment "I keep doing drugs. I just smoke crack all  day long. My friends give it to me for free. I'm so tired of it and want a change. I am ready to get off it. I'm not wanting to hurt myself today but the thought of suicide crosses my mind. I hear voices telling me to do stuff. All of this makes me very sad." The patient becomes very tearful during the interview process and appears depressed. Her urine drug screen is positive for cocaine and marijuana. [name] verbalizes that the voices are worse when she is actively using cocaine and that today they are not bothering her. The patient is observed itching her legs and arms during the assessment. She reports there being a bedbug infestation where she had been staying with friends. Yesterday she was made to be a Do Not Admit because of her skin lesions. 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 She has also not been compliant with her prescription psychiatric medications due to her cocaine abuse. Patient reports following up with [facility] in the past but reported "I stopped going."   Attendees: Patient:    Family:    Physician: Dr. Parke Poisson; Dr. Sabra Heck 10/16/14 9:30 AM  Nursing: Grayland Ormond, Darrol Angel RN 10/16/14 9:30 AM  Clinical Social Worker: Erasmo Downer Ceaser Ebeling, [place] 10/16/14 9:30 AM  Other: Joette Catching, LCSW 10/16/14 9:30 AM  Other: Lucinda Dell, Beverly Sessions Liaison 10/16/14 9:30 AM  Other: Lars Pinks, Case Manager 10/16/14 9:30 AM  Other: Agustina Caroli, NP 10/16/14 9:30 AM  Other: Elmarie Shiley, NP 10/16/14 9:30 AM  Other:    Scribe for Treatment Team:   Tilden Fossa, MSW, [place] Clinical Social Worker [facility]  [phone]  

## 2014-10-16 NOTE — Progress Notes (Signed)
[facility] MD Progress Note  10/16/2014 6:47 PM [name]  MRN:  [id number] Subjective:  [name] continues to have a hard time. She is concerned that if she goes where she was planning to go she will be using by this afternoon ( tearful when talking about it) still endorses depression as well as cravings for cocaine. She is upset because she is not going to be able to go to a residential treatment program. Diagnosis:   DSM5: Substance/Addictive Disorders:  Cocaine related disorder severe Depressive Disorders:  Major Depressive Disorder - Severe (296.23) Total Time spent with patient: 30 minutes  Axis I: Substance Induced Mood Disorder  ADL's:  Intact  Sleep: Fair  Appetite:  Poor   Psychiatric Specialty Exam: Physical Exam  Review of Systems  Constitutional: Positive for malaise/fatigue.  HENT: Negative.   Eyes: Negative.   Respiratory: Negative.   Cardiovascular: Negative.   Gastrointestinal: Negative.   Genitourinary: Negative.   Musculoskeletal: Negative.   Skin: Negative.   Neurological: Positive for weakness.  Endo/Heme/Allergies: Negative.   Psychiatric/Behavioral: Positive for depression and substance abuse. The patient is nervous/anxious.     Blood pressure 123/53, pulse 53, temperature 98.3 F (36.8 C), temperature source Oral, resp. rate 16, height 5' 1.5" (1.562 m), weight 42.638 kg (94 lb).Body mass index is 17.48 kg/(m^2).  General Appearance: Disheveled  Eye Sport and exercise psychologist::  Fair  Speech:  Clear and Coherent, Slow and not spontaneous  Volume:  Decreased  Mood:  Anxious, Depressed and worried  Affect:  Depressed, Tearful and anxious worried  Thought Process:  Coherent and Goal Directed  Orientation:  Full (Time, Place, and Person)  Thought Content:  symptoms events worries concerns, fear of relapsing fear of dying  Suicidal Thoughts:  No  Homicidal Thoughts:  No  Memory:  Immediate;   Fair Recent;   Fair Remote;   Fair  Judgement:  Fair  Insight:  Shallow   Psychomotor Activity:  Restlessness  Concentration:  Fair  Recall:  [employer] of Knowledge:Poor  Language: Fair  Akathisia:  No  Handed:  Right  AIMS (if indicated):     Assets:  Desire for Improvement  Sleep:  Number of Hours: 6.5   Musculoskeletal: Strength & Muscle Tone: within normal limits Gait & Station: normal Patient leans: N/A  Current Medications: Current Facility-Administered Medications  Medication Dose Route Frequency Provider Last Rate Last Dose  . alum & mag hydroxide-simeth (MAALOX/MYLANTA) 200-200-20 MG/5ML suspension 30 mL  30 mL Oral Q4H PRN Laverle Hobby, PA-C      . aspirin tablet 325 mg  325 mg Oral Daily Laverle Hobby, PA-C   325 mg at 10/16/14 0755  . buPROPion (WELLBUTRIN XL) 24 hr tablet 150 mg  150 mg Oral Daily Nicholaus Bloom, MD   150 mg at 10/16/14 0755  . diphenhydrAMINE (BENADRYL) capsule 50 mg  50 mg Oral Q6H PRN Skip Estimable, MD   50 mg at 10/16/14 1425  . feeding supplement (ENSURE COMPLETE) (ENSURE COMPLETE) liquid 237 mL  237 mL Oral TID BM Dorann Ou, RD   237 mL at 10/16/14 1428  . hydrocortisone cream 0.5 %   Topical TID PRN Elmarie Shiley, NP      . hydrOXYzine (ATARAX/VISTARIL) tablet 25 mg  25 mg Oral Q6H PRN Elmarie Shiley, NP   25 mg at 10/16/14 1508  . ibuprofen (ADVIL,MOTRIN) tablet 600 mg  600 mg Oral Q6H PRN Laverle Hobby, PA-C   600 mg at 10/16/14 1305  .  magnesium hydroxide (MILK OF MAGNESIA) suspension 30 mL  30 mL Oral Daily PRN Laverle Hobby, PA-C      . neomycin-bacitracin-polymyxin (NEOSPORIN) ointment   Topical PRN Laverle Hobby, PA-C   1 application at 44/62/86 [phone]  . traZODone (DESYREL) tablet 50 mg  50 mg Oral QHS,MR X 1 Laverle Hobby, PA-C   50 mg at 10/15/14 2336    Lab Results: No results found for this or any previous visit (from the past 48 hour(s)).  Physical Findings: AIMS: Facial and Oral Movements Muscles of Facial Expression: None, normal Lips and Perioral Area: None, normal Jaw: None,  normal Tongue: None, normal,Extremity Movements Upper (arms, wrists, hands, fingers): None, normal Lower (legs, knees, ankles, toes): None, normal, Trunk Movements Neck, shoulders, hips: None, normal, Overall Severity Severity of abnormal movements (highest score from questions above): None, normal Incapacitation due to abnormal movements: None, normal Patient's awareness of abnormal movements (rate only patient's report): No Awareness, Dental Status Current problems with teeth and/or dentures?: No Does patient usually wear dentures?: No  CIWA:  CIWA-Ar Total: 1 COWS:  COWS Total Score: 1  Treatment Plan Summary: Daily contact with patient to assess and evaluate symptoms and progress in treatment Medication management  Plan: Supportive approach/coping skills/relapse prevention           Depression; Wellbutrin was increased this AM to 300 mg XL           Cocaine cravings: will see how much the Wellbutrin can help with the cocaine craving           CBT;mindfulness           Work on a safe D/C Medical Decision Making Problem Points:  Established problem, worsening (2) and Review of psycho-social stressors (1) Data Points:  Review of medication regiment & side effects (2) Review of new medications or change in dosage (2)  I certify that inpatient services furnished can reasonably be expected to improve the patient's condition.   [place] A 10/16/2014, 6:47 PM

## 2014-10-16 NOTE — Plan of Care (Signed)
Problem: Ineffective individual coping Goal: STG: Patient will remain free from self harm Outcome: Progressing Pt has been safe on the unit Aeb documentation.   Problem: Diagnosis: Increased Risk For Suicide Attempt Goal: LTG-Patient Will Report Improved Mood and Deny Suicidal LTG (by discharge) Patient will report improved mood and deny suicidal ideation.  Outcome: Progressing Pt denies SI/HI/AVH at this time Goal: LTG-Patient Will Report Absence of Withdrawal Symptoms LTG (by discharge): Patient will report absence of withdrawal symptoms.  Outcome: Progressing Pt denies any symptoms of withdrawal at this time

## 2014-10-17 DIAGNOSIS — F1420 Cocaine dependence, uncomplicated: Secondary | ICD-10-CM

## 2014-10-17 MED ORDER — BUPROPION HCL ER (XL) 150 MG PO TB24: 150.0000 mg | ORAL_TABLET | Freq: Every day | ORAL | Status: DC

## 2014-10-17 MED ORDER — HYDROXYZINE HCL 25 MG PO TABS: 25.0000 mg | ORAL_TABLET | Freq: Four times a day (QID) | ORAL | Status: DC | PRN

## 2014-10-17 MED ORDER — TRAZODONE HCL 50 MG PO TABS: 50.0000 mg | ORAL_TABLET | Freq: Every evening | ORAL | Status: DC | PRN

## 2014-10-17 MED ORDER — ASPIRIN 325 MG PO TABS: 325.0000 mg | ORAL_TABLET | Freq: Every day | ORAL | Status: DC

## 2014-10-17 NOTE — Progress Notes (Signed)
[facility] Adult Case Management Discharge Plan :  Will you be returning to the same living situation after discharge: No. Patient plans to discharge home with a friend. At discharge, do you have transportation home?:Yes,  patient's friend Angelica Pou will be providing transportation. Do you have the ability to pay for your medications:Yes,  Patient will be provided with prescriptions at discharge and verbalized her ability to obtain medications  Release of information consent forms completed and in the chart;  Patient's signature needed at discharge.  Patient to Follow up at: Follow-up Information    [facility]  On 10/18/2014.   Why:  Intake assessment for therapy and medication management services with Zollie Beckers on Thursday, January 14th at 8 am. Please call office if you need to reschedule appointment.   Contact information:   [place]  [id number]  [phone]        Patient denies SI/HI:   Yes,  denies    Safety Planning and Suicide Prevention discussed:  Yes,  with patient  Patient refused referral  Shaunae Sieloff, Casimiro Needle 10/17/2014, 10:35 AM

## 2014-10-17 NOTE — [facility] Group Notes (Signed)
Late Entry from 10/16/14:   [facility] LCSW Group Therapy  10/16/14   1:15 PM   Type of Therapy:  Group Therapy  Participation Level:  Active  Participation Quality:  Attentive, Sharing and Supportive  Affect:  Depressed and Flat  Cognitive:  Alert and Oriented  Insight:  Developing/Improving and Engaged  Engagement in Therapy:  Developing/Improving and Engaged  Modes of Intervention:  Clarification, Confrontation, Discussion, Education, Exploration, Limit-setting, Orientation, Problem-solving, Rapport Building, Art therapist, Socialization and Support  Summary of Progress/Problems: The topic for group therapy was feelings about diagnosis.  Pt actively participated in group discussion on their past and current diagnosis and how they feel towards this.  Pt also identified how society and family members judge them, based on their diagnosis as well as stereotypes and stigmas.  Patient became tearful when discussing that she "feels better" than when she was admitted. She verbalized her appreciation for staff assistance during hospitalization. CSW's and other group members provided emotional support and encouragement.  Tilden Fossa, MSW, [place] Worker [facility] [phone]

## 2014-10-17 NOTE — [facility] Suicide Risk Assessment (Signed)
Suicide Risk Assessment  Discharge Assessment     Demographic Factors:  Low socioeconomic status and Living alone  Total Time spent with patient: 30 minutes  Psychiatric Specialty Exam:     Blood pressure 136/87, pulse 85, temperature 97.7 F (36.5 C), temperature source Oral, resp. rate 16, height 5' 1.5" (1.562 m), weight 42.638 kg (94 lb).Body mass index is 17.48 kg/(m^2).  General Appearance: Fairly Groomed  Engineer, water::  Fair  Speech:  Clear and Coherent  Volume:  Normal  Mood:  Anxious and worried  Affect:  Appropriate  Thought Process:  Coherent and Goal Directed  Orientation:  Full (Time, Place, and Person)  Thought Content:  plans as she moves on, relapse prevention plan  Suicidal Thoughts:  No  Homicidal Thoughts:  No  Memory:  Immediate;   Fair Recent;   Fair Remote;   Fair  Judgement:  Fair  Insight:  Present and Shallow  Psychomotor Activity:  Restlessness  Concentration:  Fair  Recall:  [employer] of Knowledge:Fair  Language: Fair  Akathisia:  No  Handed:  Right  AIMS (if indicated):     Assets:  Desire for Improvement Social Support  Sleep:  Number of Hours: 6.5    Musculoskeletal: Strength & Muscle Tone: within normal limits Gait & Station: normal Patient leans: N/A   Mental Status Per Nursing Assessment::   On Admission:  Self-harm thoughts, Belief that plan would result in death  Current Mental Status by Physician: In full contact with reality. There are no active S/S of withdrawal. There are no active SI plans or intent. She states she is committed to abstinence. She will be picked up by a supportive friend and taken to a rental place where she can stay. She is going to be connected with [facility] in the morning. They expressed willingness to work with her.    Loss Factors: Decline in physical health  Historical Factors: NA  Risk Reduction Factors:   Living with another person, especially a relative and Positive social support  Continued  Clinical Symptoms:  Depression:   Comorbid alcohol abuse/dependence Alcohol/Substance Abuse/Dependencies  Cognitive Features That Contribute To Risk:  Closed-mindedness Polarized thinking Thought constriction (tunnel vision)    Suicide Risk:  Minimal: No identifiable suicidal ideation.  Patients presenting with no risk factors but with morbid ruminations; may be classified as minimal risk based on the severity of the depressive symptoms  Discharge Diagnoses:   AXIS I:  Cocaine Dependence, Major Depression recurrent severe, substance induced mood disorder AXIS II:  No diagnosis AXIS III:   Past Medical History  Diagnosis Date  . 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 Followed by Dr. Louanne Skye  . Hypertension   . Cocaine abuse     ongoing with resultant vaculitis.  . Depression   . Normocytic anemia     BL Hgb 9.8-12. Last anemia panel 04/2010 - showing Fe 19, ferritin 101.  Pt on monthly B12 injections  . 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 CAP (community acquired pneumonia) 03/2014 X 2  . Daily headache   . Migraines     "probably 5-6/yr" (04/21/2014)  . Rheumatoid arthritis(714.0)     patient reported  . Inflammatory arthritis    AXIS IV:  other psychosocial or environmental problems AXIS V:  61-70 mild symptoms  Plan Of Care/Follow-up recommendations:  Activity:  as tolerated Diet:  regular Follow up [facility] in the morning Continue to take  the Wellbutrin XL 300 mg in AM Is patient on multiple antipsychotic therapies at discharge:  No   Has Patient had three or more failed trials of antipsychotic monotherapy by history:  No  Recommended Plan for Multiple Antipsychotic Therapies: NA    Cannan Beeck A 10/17/2014, 1:24 PM

## 2014-10-17 NOTE — Progress Notes (Signed)
Patient discharged per physician order; patient denies SI/HI and A/V hallucinations; patient received prescriptions, samples, and copy of AVS after it wasreviewed; patient had no other questions or concerns at this time; patient verbalized and signed that she received all belongings; patient left the unit ambulatory

## 2014-10-17 NOTE — Discharge Summary (Signed)
Physician Discharge Summary Note  Patient:  [name] is an 54 y.o., female MRN:  [id number] DOB:  Mar 21, 1961 Patient phone:  [phone] (home)  Patient address:   [address] [place] [address],  Total Time spent with patient: 45 minutes  Date of Admission:  10/09/2014 Date of Discharge: 10/17/2014  Reason for Admission:  Depression, cocaine abuse  Discharge Diagnoses:   Cocaine Dependence, Major Depression recurrent severe, substance induced mood disorder  Principal Problem:   Cannabis use disorder, severe, dependence Active Problems:   Substance induced mood disorder   Cocaine use disorder, severe, dependence   Substance or medication-induced depressive disorder with onset during intoxication   Severe recurrent major depression without psychotic features   Psychiatric Specialty Exam: Physical Exam  Vitals reviewed. Psychiatric: She has a normal mood and affect. Her behavior is normal. Judgment and thought content normal.    Review of Systems  Constitutional: Negative.   HENT: Negative.   Eyes: Negative.   Respiratory: Negative.   Cardiovascular: Negative.   Gastrointestinal: Negative.   Genitourinary: Negative.   Musculoskeletal: Negative.   Skin: Negative.   Neurological: Negative.   Endo/Heme/Allergies: Negative.   Psychiatric/Behavioral: Positive for depression. Negative for suicidal ideas, memory loss and substance abuse. The patient is nervous/anxious. The patient does not have insomnia.     Blood pressure 136/87, pulse 85, temperature 97.7 F (36.5 C), temperature source Oral, resp. rate 16, height 5' 1.5" (1.562 m), weight 42.638 kg (94 lb).Body mass index is 17.48 kg/(m^2).   Musculoskeletal:  Strength & Muscle Tone: within normal limits  Gait & Station: normal  Patient leans: N/A  Past Psychiatric History:Yes  Diagnosis: cocaine dependence and depression  Hospitalizations: [facility] several times   Outpatient Care: [facility]  Substance Abuse  Care: at [facility] earlier this year for substance abuse   Self-Mutilation:no  Suicidal Attempts: Denies  Violent Behaviors: no   Discharge Diagnoses: AXIS I: Cocaine Dependence, Major Depression recurrent severe, substance induced mood disorder AXIS II: No diagnosis AXIS III:  Past Medical History  Diagnosis Date  . 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 Followed by Dr. Louanne Skye  . Hypertension   . Cocaine abuse     ongoing with resultant vaculitis.  . Depression   . Normocytic anemia     BL Hgb 9.8-12. Last anemia panel 04/2010 - showing Fe 19, ferritin 101. Pt on monthly B12 injections  . VASCULITIS 04/17/2010    2/2 levimasole toxicity vs autoimmune d/o   . CAP (community acquired pneumonia) 03/2014 X 2  . Daily headache   . Migraines     "probably 5-6/yr" (04/21/2014)  . Rheumatoid arthritis(714.0)     patient reported  . Inflammatory arthritis    AXIS IV: other psychosocial or environmental problems AXIS V: 61-70 mild symptoms  Level of Care:  OP  Hospital Course:  [name] is an 54 y.o. Female who presented to [facility] with depression, anxiety, and cocaine use disorder, requesting help to get clean. Pt reports she is tired of her use and wants long term treatment to help her maintain sobriety. In the ED the patient reported suicidal thoughts to cut wrists and command hallucinations telling her to run into traffic. She also reports being estranged from her family for 17 years, and not taking care of herself as additional reason to seek treatment. Patient states during her psychiatric assessment "I keep doing drugs. I just smoke crack all day long. My friends give it to me for free.  I'm so tired of it and want a change. I am ready to get off it. I'm not wanting to hurt myself today but the thought of suicide crosses my mind. I hear voices telling me to do stuff. All of this makes me very sad." The patient becomes very  tearful during the interview process and appears depressed. Her urine drug screen is positive for cocaine and marijuana. [name] verbalizes that the voices are worse when she is actively using cocaine and that today they are not bothering her. The patient is observed itching her legs and arms during the assessment. She reports there being a bedbug infestation where she had been staying with friends. Yesterday she was made to be a Do Not Admit because of her skin lesions. 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 She has also not been compliant with her prescription psychiatric medications due to her cocaine abuse. Patient reports following up with [facility] in the past but reported "I stopped going."   She was accepted at [facility] for inpatient treatment.  To help re-stabilize her moods, medication management and supportive/group therapy was necessary.  She tolerated Wellbutrin XL 300 mg for depression.  She was monitored daily for withdrawal symptoms and treatment effect.  She was observed to attend some of the groups.  At time of discharge, she rated both depression and anxiety levels to be manageable and minimal.  She  did well with the medications prescribed for her.  Denies physiological concerns/SI/HI/AVH at time of discharge.  She was encouraged to adhere to medication compliance and outpatient [facility] [place].  She was given sample medications by [facility].    Consults:  psychiatry  Significant Diagnostic Studies:  labs: per ED  Discharge Vitals:   Blood pressure 136/87, pulse 85, temperature 97.7 F (36.5 C), temperature source Oral, resp. rate 16, height 5' 1.5" (1.562 m), weight 42.638 kg (94 lb). Body mass index is 17.48 kg/(m^2). Lab Results:   No results found for this or any previous visit (from the past 72 hour(s)).  Physical Findings: AIMS: Facial and Oral Movements Muscles of Facial Expression:  None, normal Lips and Perioral Area: None, normal Jaw: None, normal Tongue: None, normal,Extremity Movements Upper (arms, wrists, hands, fingers): None, normal Lower (legs, knees, ankles, toes): None, normal, Trunk Movements Neck, shoulders, hips: None, normal, Overall Severity Severity of abnormal movements (highest score from questions above): None, normal Incapacitation due to abnormal movements: None, normal Patient's awareness of abnormal movements (rate only patient's report): No Awareness, Dental Status Current problems with teeth and/or dentures?: No Does patient usually wear dentures?: No  CIWA:  CIWA-Ar Total: 1 COWS:  COWS Total Score: 1  Psychiatric Specialty Exam: See Psychiatric Specialty Exam and Suicide Risk Assessment completed by Attending Physician prior to discharge.  Discharge destination:  Home  Is patient on multiple antipsychotic therapies at discharge:  No   Has Patient had three or more failed trials of antipsychotic monotherapy by history:  No  Recommended Plan for Multiple Antipsychotic Therapies: NA     Medication List    STOP taking these medications        buPROPion 100 MG tablet  Commonly known as:  WELLBUTRIN  Replaced by:  buPROPion 150 MG 24 hr tablet     ciprofloxacin 500 MG tablet  Commonly known as:  CIPRO     fluconazole 150 MG tablet  Commonly known as:  DIFLUCAN  meloxicam 15 MG tablet  Commonly known as:  MOBIC     oxyCODONE 5 MG immediate release tablet  Commonly known as:  ROXICODONE     permethrin 5 % cream  Commonly known as:  ELIMITE     predniSONE 20 MG tablet  Commonly known as:  DELTASONE     silver sulfADIAZINE 1 % cream  Commonly known as:  SILVADENE     sulfamethoxazole-trimethoprim 800-160 MG per tablet  Commonly known as:  SEPTRA DS      TAKE these medications      Indication   aspirin 325 MG tablet  Take 1 tablet (325 mg total) by mouth daily.   Indication:  Mild to Moderate Pain      buPROPion 150 MG 24 hr tablet  Commonly known as:  WELLBUTRIN XL  Take 1 tablet (150 mg total) by mouth daily.   Indication:  Major Depressive Disorder     hydrOXYzine 25 MG tablet  Commonly known as:  ATARAX/VISTARIL  Take 1 tablet (25 mg total) by mouth every 6 (six) hours as needed for itching or anxiety.   Indication:  Anxiety Neurosis, Itching due to Histamine, Tension     ibuprofen 200 MG tablet  Commonly known as:  ADVIL,MOTRIN  Take 200 mg by mouth every 6 (six) hours as needed for moderate pain.      traZODone 50 MG tablet  Commonly known as:  DESYREL  Take 1 tablet (50 mg total) by mouth at bedtime and may repeat dose one time if needed.   Indication:  Trouble Sleeping       Follow-up Information    [facility] with [facility]  On 10/18/2014.   Why:  Intake assessment for therapy and medication management services with Zollie Beckers on Thursday, January 14th at 8 am. Please call office if you need to reschedule appointment.   Contact information:   [place]  [id number]  [phone]        Follow-up recommendations:  Activity:  as tol, diet as tol  Comments:  1.  Take all your medications as prescribed.              2.  Report any adverse side effects to outpatient provider.                       3.  Patient instructed to not use alcohol or illegal drugs while on prescription medicines.            4.  In the event of worsening symptoms, instructed patient to call 911, the crisis hotline or go to nearest emergency room for evaluation of symptoms.  Total Discharge Time:  Greater than 30 minutes.  SignedKerrie Buffalo MAY, AGNP-BC 10/17/2014, 4:05 PM  I personally assessed the patient and formulated the plan Geralyn Flash A. Sabra Heck, M.D.

## 2014-10-17 NOTE — BHH Suicide Risk Assessment (Signed)
[facility] INPATIENT:  Family/Significant Other Suicide Prevention Education  Suicide Prevention Education:  Patient Refusal for Family/Significant Other Suicide Prevention Education: The patient [name] has refused to provide written consent for family/significant other to be provided Family/Significant Other Suicide Prevention Education during admission and/or prior to discharge.  Physician notified. SPE reviewed with patient and brochure provided.  Augustin Bun, Casimiro Needle 10/17/2014, 10:34 AM

## 2014-10-17 NOTE — BHH Group Notes (Signed)
   [facility] LCSW Aftercare Discharge Planning Group Note  10/17/2014  8:45 AM   Participation Quality: Alert, Appropriate and Oriented  Mood/Affect: Appropriate  Depression Rating: 5  Anxiety Rating: 2  Thoughts of Suicide: Pt denies SI/HI  Will you contract for safety? Yes  Current AVH: Pt denies  Plan for Discharge/Comments: Pt attended discharge planning group and actively participated in group. CSW provided pt with today's workbook. Patient reports feeling "good" today but complains of pain in her legs. She is agreeable to discharge with her friend Destiny this afternoon and will follow up with [facility] tomorrow morning for outpatient services.  Transportation Means: Pt reports access to transportation  Supports: No supports mentioned at this time  Tilden Fossa, MSW, [place] Social Worker [employer] [phone]

## 2014-10-19 NOTE — Progress Notes (Signed)
Patient Discharge Instructions:  After Visit Summary (AVS):   Faxed to:  10/19/14 Discharge Summary Note:   Faxed to:  10/19/14 Psychiatric Admission Assessment Note:   Faxed to:  10/19/14 Suicide Risk Assessment - Discharge Assessment:   Faxed to:  10/19/14 Faxed/Sent to the Next Level Care provider:  10/19/14 Faxed to [facility] @ [place], 10/19/2014, 3:50 PM

## 2014-12-10 ENCOUNTER — Encounter: Payer: Self-pay | Admitting: *Deleted

## 2014-12-15 ENCOUNTER — Encounter (HOSPITAL_COMMUNITY): Payer: Self-pay | Admitting: Emergency Medicine

## 2014-12-15 ENCOUNTER — Emergency Department (HOSPITAL_COMMUNITY)
Admission: EM | Admit: 2014-12-15 | Discharge: 2014-12-16 | Disposition: A | Discharge status: Home / Self Care | Payer: Medicaid Other | Attending: Emergency Medicine | Admitting: Emergency Medicine

## 2014-12-15 DIAGNOSIS — L97911 Non-pressure chronic ulcer of unspecified part of right lower leg limited to breakdown of skin: Secondary | ICD-10-CM | POA: Diagnosis not present

## 2014-12-15 DIAGNOSIS — I776 Arteritis, unspecified: Secondary | ICD-10-CM | POA: Diagnosis not present

## 2014-12-15 DIAGNOSIS — I10 Essential (primary) hypertension: Secondary | ICD-10-CM | POA: Insufficient documentation

## 2014-12-15 DIAGNOSIS — Z7982 Long term (current) use of aspirin: Secondary | ICD-10-CM | POA: Diagnosis not present

## 2014-12-15 DIAGNOSIS — L97921 Non-pressure chronic ulcer of unspecified part of left lower leg limited to breakdown of skin: Secondary | ICD-10-CM | POA: Diagnosis not present

## 2014-12-15 DIAGNOSIS — Z79899 Other long term (current) drug therapy: Secondary | ICD-10-CM | POA: Diagnosis not present

## 2014-12-15 DIAGNOSIS — Z8701 Personal history of pneumonia (recurrent): Secondary | ICD-10-CM | POA: Diagnosis not present

## 2014-12-15 DIAGNOSIS — M069 Rheumatoid arthritis, unspecified: Secondary | ICD-10-CM | POA: Diagnosis not present

## 2014-12-15 DIAGNOSIS — G43909 Migraine, unspecified, not intractable, without status migrainosus: Secondary | ICD-10-CM | POA: Diagnosis not present

## 2014-12-15 DIAGNOSIS — Z720 Tobacco use: Secondary | ICD-10-CM | POA: Diagnosis not present

## 2014-12-15 DIAGNOSIS — F329 Major depressive disorder, single episode, unspecified: Secondary | ICD-10-CM | POA: Diagnosis not present

## 2014-12-15 DIAGNOSIS — Z862 Personal history of diseases of the blood and blood-forming organs and certain disorders involving the immune mechanism: Secondary | ICD-10-CM | POA: Diagnosis not present

## 2014-12-15 DIAGNOSIS — R21 Rash and other nonspecific skin eruption: Secondary | ICD-10-CM | POA: Diagnosis present

## 2014-12-15 DIAGNOSIS — L97901 Non-pressure chronic ulcer of unspecified part of unspecified lower leg limited to breakdown of skin: Secondary | ICD-10-CM

## 2014-12-15 LAB — CBC WITH DIFFERENTIAL/PLATELET
Basophils Absolute: 0 10*3/uL (ref 0.0–0.1)
Basophils Relative: 0 % (ref 0–1)
EOS ABS: 0 10*3/uL (ref 0.0–0.7)
Eosinophils Relative: 1 % (ref 0–5)
HCT: 31.9 % — ABNORMAL LOW (ref 36.0–46.0)
HEMOGLOBIN: 11 g/dL — AB (ref 12.0–15.0)
LYMPHS ABS: 1.5 10*3/uL (ref 0.7–4.0)
Lymphocytes Relative: 36 % (ref 12–46)
MCH: 30.7 pg (ref 26.0–34.0)
MCHC: 34.5 g/dL (ref 30.0–36.0)
MCV: 89.1 fL (ref 78.0–100.0)
Monocytes Absolute: 0 10*3/uL — ABNORMAL LOW (ref 0.1–1.0)
Monocytes Relative: 1 % — ABNORMAL LOW (ref 3–12)
Neutro Abs: 2.6 10*3/uL (ref 1.7–7.7)
Neutrophils Relative %: 62 % (ref 43–77)
Platelets: 269 10*3/uL (ref 150–400)
RBC: 3.58 MIL/uL — ABNORMAL LOW (ref 3.87–5.11)
RDW: 13.8 % (ref 11.5–15.5)
WBC: 4.1 10*3/uL (ref 4.0–10.5)

## 2014-12-15 LAB — COMPREHENSIVE METABOLIC PANEL
ALT: 11 U/L (ref 0–35)
AST: 28 U/L (ref 0–37)
Albumin: 3.7 g/dL (ref 3.5–5.2)
Alkaline Phosphatase: 58 U/L (ref 39–117)
Anion gap: 5 (ref 5–15)
BUN: 14 mg/dL (ref 6–23)
CO2: 26 mmol/L (ref 19–32)
Calcium: 8.9 mg/dL (ref 8.4–10.5)
Chloride: 107 mmol/L (ref 96–112)
Creatinine, Ser: 0.73 mg/dL (ref 0.50–1.10)
GFR calc non Af Amer: >90 mL/min (ref >90)
GLUCOSE: 104 mg/dL — AB (ref 70–99)
Potassium: 4 mmol/L (ref 3.5–5.1)
Sodium: 138 mmol/L (ref 135–145)
Total Bilirubin: 0.4 mg/dL (ref 0.3–1.2)
Total Protein: 6.8 g/dL (ref 6.0–8.3)

## 2014-12-15 MED ORDER — DEXAMETHASONE SODIUM PHOSPHATE 10 MG/ML IJ SOLN
10.0000 mg | Freq: Once | INTRAMUSCULAR | Status: AC
  Administered 2014-12-15: 10 mg via INTRAMUSCULAR
  Filled 2014-12-15: qty 1

## 2014-12-15 NOTE — ED Provider Notes (Signed)
CSN: [id number]     Arrival date & time 12/15/14  2054 History   First MD Initiated Contact with Patient 12/15/14 2056     Chief Complaint  Patient presents with  . Rash      HPI Pt from [place], per ems pt co rash bilateral legs and arms for 2 wks. Also co pruritis. Hx shingles . unknown exposure to possible allergen or possible contamination. Pt alert and oriented x4. 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  Similar presentations to the emergency department with similar complaints in the past.  Denies any fever.  Denies nausea vomiting. Past Medical History  Diagnosis Date  . 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 Followed by Dr. Louanne Skye  . Hypertension   . Cocaine abuse     ongoing with resultant vaculitis.  . Depression   . Normocytic anemia     BL Hgb 9.8-12. Last anemia panel 04/2010 - showing Fe 19, ferritin 101.  Pt on monthly B12 injections  . 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 CAP (community acquired pneumonia) 03/2014 X 2  . Daily headache   . Migraines     "probably 5-6/yr" (04/21/2014)  . Rheumatoid arthritis(714.0)     patient reported  . Inflammatory arthritis    Past Surgical History  Procedure Laterality Date  . Skin biopsy Bilateral 04/2010    shin nodules  . Irrigation and debridement abscess Bilateral 09/26/2013    Procedure: DEBRIDEMENT ULCERS BILATERAL THIGHS;  Surgeon: Gwenyth Ober, MD;  Location: [facility];  Service: General;  Laterality: Bilateral;  . Hernia repair      "stomach"   Family History  Problem Relation Age of Onset  . Breast cancer Mother     Breast cancer  . Alcohol abuse Mother   . Colon cancer Maternal Aunt 31  . Alcohol abuse Father    History  Substance Use Topics  . Smoking status: Current Every Day Smoker -- 0.30 packs/day for 34 years    Types: Cigarettes  . Smokeless tobacco: Never Used     Comment: PATIENT HAS CUT BACK TO 5 CIGARETES A DAY  . Alcohol Use: No   OB  History    No data available     Review of Systems   All other systems reviewed and are negative Allergies  Acetaminophen  Home Medications   Prior to Admission medications   Medication Sig Start Date End Date Taking? Authorizing Provider  hydrOXYzine (ATARAX/VISTARIL) 25 MG tablet Take 1 tablet (25 mg total) by mouth every 6 (six) hours as needed for itching or anxiety. 10/17/14  Yes Kerrie Buffalo, NP  aspirin 325 MG tablet Take 1 tablet (325 mg total) by mouth daily. Patient not taking: Reported on 12/15/2014 10/17/14   Kerrie Buffalo, NP  buPROPion (WELLBUTRIN XL) 150 MG 24 hr tablet Take 1 tablet (150 mg total) by mouth daily. Patient not taking: Reported on 12/15/2014 10/17/14   Kerrie Buffalo, NP  traZODone (DESYREL) 50 MG tablet Take 1 tablet (50 mg total) by mouth at bedtime and may repeat dose one time if needed. Patient not taking: Reported on 12/15/2014 10/17/14   Kerrie Buffalo, NP   BP 122/78 mmHg  Pulse 76  Temp(Src) 98.4 F (36.9 C) (Oral)  Resp 18  SpO2 99% Physical Exam  Constitutional: She is oriented to person, place, and time. She appears well-developed and well-nourished. No distress.  HENT:  Head: Normocephalic and  atraumatic.  Eyes: Pupils are equal, round, and reactive to light.  Neck: Normal range of motion.  Cardiovascular: Normal rate and intact distal pulses.   Pulmonary/Chest: No respiratory distress.  Abdominal: Normal appearance. She exhibits no distension.  Musculoskeletal: Normal range of motion.  Neurological: She is alert and oriented to person, place, and time. No cranial nerve deficit.  Skin: Skin is warm and dry. Rash (Rash on arms and legs.  Similar to previous presentations with no evidence of cellulitis or infection.) noted.  Psychiatric: She has a normal mood and affect. Her behavior is normal.  Nursing note and vitals reviewed.   ED Course  Procedures (including critical care time) Medications  dexamethasone (DECADRON) injection 10  mg (10 mg Intramuscular Given 12/15/14 2320)    Labs Review Labs Reviewed  CBC WITH DIFFERENTIAL/PLATELET - Abnormal; Notable for the following:    RBC 3.58 (*)    Hemoglobin 11.0 (*)    HCT 31.9 (*)    Monocytes Relative 1 (*)    Monocytes Absolute 0.0 (*)    All other components within normal limits  COMPREHENSIVE METABOLIC PANEL - Abnormal; Notable for the following:    Glucose, Bld 104 (*)    All other components within normal limits    Imaging Review No results found.    MDM   Final diagnoses:  Vasculitic leg ulcer, unspecified laterality, limited to breakdown of skin        Leonard Schwartz, MD 12/20/14 [phone]

## 2014-12-15 NOTE — ED Notes (Signed)
Bed: ZL93 Expected date:  Expected time:  Means of arrival:  Comments: 54 yo F from the [place] stops, Rash

## 2014-12-15 NOTE — ED Notes (Signed)
Pt from [place]undry [place], per ems pt co rash bilateral legs and arms for 2 wks. Also co pruritis. Hx shingles . unknown exposure to possible allergen or possible contamination. Pt alert and oriented x4.

## 2014-12-16 NOTE — ED Notes (Signed)
Awake. Verbally responsive. A/O x4. Resp even and unlabored. No audible adventitious breath sounds noted. ABC's intact.  

## 2015-01-04 ENCOUNTER — Emergency Department (HOSPITAL_COMMUNITY)
Admission: EM | Admit: 2015-01-04 | Discharge: 2015-01-04 | Disposition: A | Discharge status: Home / Self Care | Payer: Medicaid Other | Attending: Emergency Medicine | Admitting: Emergency Medicine

## 2015-01-04 ENCOUNTER — Encounter (HOSPITAL_COMMUNITY): Payer: Self-pay | Admitting: Emergency Medicine

## 2015-01-04 DIAGNOSIS — G43909 Migraine, unspecified, not intractable, without status migrainosus: Secondary | ICD-10-CM | POA: Diagnosis not present

## 2015-01-04 DIAGNOSIS — Z79899 Other long term (current) drug therapy: Secondary | ICD-10-CM | POA: Insufficient documentation

## 2015-01-04 DIAGNOSIS — M069 Rheumatoid arthritis, unspecified: Secondary | ICD-10-CM | POA: Insufficient documentation

## 2015-01-04 DIAGNOSIS — Z7982 Long term (current) use of aspirin: Secondary | ICD-10-CM | POA: Insufficient documentation

## 2015-01-04 DIAGNOSIS — Z720 Tobacco use: Secondary | ICD-10-CM | POA: Diagnosis not present

## 2015-01-04 DIAGNOSIS — F329 Major depressive disorder, single episode, unspecified: Secondary | ICD-10-CM | POA: Insufficient documentation

## 2015-01-04 DIAGNOSIS — R21 Rash and other nonspecific skin eruption: Secondary | ICD-10-CM

## 2015-01-04 DIAGNOSIS — I10 Essential (primary) hypertension: Secondary | ICD-10-CM | POA: Insufficient documentation

## 2015-01-04 DIAGNOSIS — Z862 Personal history of diseases of the blood and blood-forming organs and certain disorders involving the immune mechanism: Secondary | ICD-10-CM | POA: Insufficient documentation

## 2015-01-04 DIAGNOSIS — Z8701 Personal history of pneumonia (recurrent): Secondary | ICD-10-CM | POA: Insufficient documentation

## 2015-01-04 MED ORDER — CEPHALEXIN 500 MG PO CAPS
500.0000 mg | ORAL_CAPSULE | Freq: Once | ORAL | Status: AC
  Administered 2015-01-04: 500 mg via ORAL
  Filled 2015-01-04: qty 1

## 2015-01-04 MED ORDER — MELOXICAM 7.5 MG PO TABS: 7.5000 mg | ORAL_TABLET | Freq: Every day | ORAL | Status: DC

## 2015-01-04 MED ORDER — OXYCODONE HCL 5 MG PO TABS: 5.0000 mg | ORAL_TABLET | ORAL | Status: DC | PRN

## 2015-01-04 MED ORDER — CEPHALEXIN 500 MG PO CAPS: 500.0000 mg | ORAL_CAPSULE | Freq: Four times a day (QID) | ORAL | Status: DC

## 2015-01-04 MED ORDER — OXYCODONE-ACETAMINOPHEN 5-325 MG PO TABS
1.0000 | ORAL_TABLET | Freq: Once | ORAL | Status: DC
  Filled 2015-01-04: qty 1

## 2015-01-04 MED ORDER — KETOROLAC TROMETHAMINE 60 MG/2ML IM SOLN
30.0000 mg | Freq: Once | INTRAMUSCULAR | Status: AC
  Administered 2015-01-04: 30 mg via INTRAMUSCULAR
  Filled 2015-01-04: qty 2

## 2015-01-04 NOTE — ED Notes (Addendum)
Pt reports mobic does not work for her.  She was seen at [facility] for same and was given mobic.  Pt states that "oxy 5s works for me."  Pt made aware that the EDPA is in a pt's room at this time and will notify her when she gets done.

## 2015-01-04 NOTE — Discharge Instructions (Signed)
FOLLOW UP WITH YOUR DOCTOR NEXT WEEK FOR RECHECK OF RASH UNDER LEFT ARM.

## 2015-01-04 NOTE — ED Provider Notes (Signed)
CSN: [id number]     Arrival date & time 01/04/15  1451 History  This chart was scribed for Charlann Lange, PA-C, working with Quintella Reichert, MD by Steva Colder, ED Scribe. The patient was seen in room WTR8/WTR8 at 4:17 PM.    Chief Complaint  Patient presents with  . Rash      The history is provided by the patient. No language interpreter was used.    HPI Comments: [name] is a 54 y.o. female with a medical hx of HTN and vasculitis who presents to the Emergency Department complaining of rash onset 2 days. Pt reports that the rash was not intially painful. Pt reports that it started as blisters. Pt noticed that rash under her left arm and she thinks it may be shingles.  Pt notes that her ears are burning. She states that she has tried her usual medication with no relief for her symptoms. She denies fever. Pt is still using drugs at this time. Pt uses steroids and oxycodone for her vasculitis.   Past Medical History  Diagnosis Date  . 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 Followed by Dr. Louanne Skye  . Hypertension   . Cocaine abuse     ongoing with resultant vaculitis.  . Depression   . Normocytic anemia     BL Hgb 9.8-12. Last anemia panel 04/2010 - showing Fe 19, ferritin 101.  Pt on monthly B12 injections  . 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 CAP (community acquired pneumonia) 03/2014 X 2  . Daily headache   . Migraines     "probably 5-6/yr" (04/21/2014)  . Rheumatoid arthritis(714.0)     patient reported  . Inflammatory arthritis    Past Surgical History  Procedure Laterality Date  . Skin biopsy Bilateral 04/2010    shin nodules  . Irrigation and debridement abscess Bilateral 09/26/2013    Procedure: DEBRIDEMENT ULCERS BILATERAL THIGHS;  Surgeon: Gwenyth Ober, MD;  Location: [facility];  Service: General;  Laterality: Bilateral;  . Hernia repair      "stomach"   Family History  Problem Relation Age of Onset  . Breast cancer Mother      Breast cancer  . Alcohol abuse Mother   . Colon cancer Maternal Aunt 68  . Alcohol abuse Father    History  Substance Use Topics  . Smoking status: Current Every Day Smoker -- 0.30 packs/day for 34 years    Types: Cigarettes  . Smokeless tobacco: Never Used     Comment: PATIENT HAS CUT BACK TO 5 CIGARETES A DAY  . Alcohol Use: No   OB History    No data available     Review of Systems  Constitutional: Negative for fever and chills.  Skin: Positive for rash.      Allergies  Acetaminophen  Home Medications   Prior to Admission medications   Medication Sig Start Date End Date Taking? Authorizing Provider  aspirin 325 MG tablet Take 1 tablet (325 mg total) by mouth daily. Patient not taking: Reported on 12/15/2014 10/17/14   Kerrie Buffalo, NP  buPROPion (WELLBUTRIN XL) 150 MG 24 hr tablet Take 1 tablet (150 mg total) by mouth daily. Patient not taking: Reported on 12/15/2014 10/17/14   Kerrie Buffalo, NP  hydrOXYzine (ATARAX/VISTARIL) 25 MG tablet Take 1 tablet (25 mg total) by mouth every 6 (six) hours as needed for itching or anxiety. 10/17/14   Kerrie Buffalo, NP  traZODone (DESYREL) 50 MG tablet Take 1 tablet (50 mg total) by mouth at bedtime and may repeat dose one time if needed. Patient not taking: Reported on 12/15/2014 10/17/14   Kerrie Buffalo, NP   Pulse 94  Temp(Src) 98.5 F (36.9 C) (Oral)  Resp 18  SpO2 100%  Physical Exam  Constitutional: She is oriented to person, place, and time. She appears well-developed and well-nourished. No distress.  HENT:  Head: Normocephalic and atraumatic.  Eyes: EOM are normal.  Neck: Neck supple. No tracheal deviation present.  Cardiovascular: Normal rate.   Pulmonary/Chest: Effort normal. No respiratory distress.  Musculoskeletal: Normal range of motion.  Neurological: She is alert and oriented to person, place, and time.  Skin: Skin is warm and dry. Rash noted.  On the left axilla, multiple scabbed ulcerations without  erythema or blistering. Outside of the axilla there is widespread hyperpigmented areas consistent with hx of vasculitis.   Psychiatric: She has a normal mood and affect. Her behavior is normal.  Nursing note and vitals reviewed.   ED Course  Procedures (including critical care time) DIAGNOSTIC STUDIES: Oxygen Saturation is 100% on RA, normal by my interpretation.    COORDINATION OF CARE: 4:21 PM-Discussed treatment plan which includes abx Rx, pain medication, and f/u with PCP with pt at bedside and pt agreed to plan.   Labs Review Labs Reviewed - No data to display  Imaging Review No results found.   EKG Interpretation None      MDM   Final diagnoses:  None    1. Rash  New rash not likely shingles. Has a different appearance from chronic vasculitis. She admits to continued cocaine use. She describes drainage - will cover with abx now. She is given pain medication in ED. Chart reviewed. H/o and ongoing cocaine abuse. Prescribed #3 Percocet for pain. She became very upset her pain was not being managed and states "I'll go to [facility]   I personally performed the services described in this documentation, which was scribed in my presence. The recorded information has been reviewed and is accurate.     Charlann Lange, PA-C 01/04/15 [place], MD 01/04/15 [id number]

## 2015-01-04 NOTE — ED Notes (Signed)
Per pt, states she noticed rash under left arm-thinks it might be shingles

## 2015-01-07 ENCOUNTER — Telehealth: Payer: Self-pay | Admitting: *Deleted

## 2015-01-07 ENCOUNTER — Encounter: Payer: Self-pay | Admitting: Licensed Clinical Social Worker

## 2015-01-07 ENCOUNTER — Encounter (HOSPITAL_COMMUNITY): Payer: Self-pay

## 2015-01-07 ENCOUNTER — Emergency Department (INDEPENDENT_AMBULATORY_CARE_PROVIDER_SITE_OTHER): Payer: Medicaid Other

## 2015-01-07 ENCOUNTER — Emergency Department (INDEPENDENT_AMBULATORY_CARE_PROVIDER_SITE_OTHER)
Admission: EM | Admit: 2015-01-07 | Discharge: 2015-01-07 | Disposition: A | Discharge status: Home / Self Care | Payer: Medicaid Other | Source: Home / Self Care | Attending: Family Medicine | Admitting: Family Medicine

## 2015-01-07 DIAGNOSIS — L02511 Cutaneous abscess of right hand: Secondary | ICD-10-CM

## 2015-01-07 DIAGNOSIS — R21 Rash and other nonspecific skin eruption: Secondary | ICD-10-CM

## 2015-01-07 DIAGNOSIS — F1910 Other psychoactive substance abuse, uncomplicated: Secondary | ICD-10-CM

## 2015-01-07 NOTE — Progress Notes (Signed)
Ms Spielberg presents to [facility] as a walk-in for her ED f/u at [facility].  [facility] did not have any appointments available today and pt was seen at Urgent Care.  Ms. Labella returns to [facility] requesting assistance with transportation home.  Pt was seen at [facility] today; pt provided with DSS Medicaid bus pass.  Verification form completed.  Pt provided with one Medicaid bus pass.  Pt has yet to pick up prescription from [facility] ED.  CSW informed [name], pharmacies accepting Medicaid must provide medication regardless of ability to pay once filled.

## 2015-01-07 NOTE — ED Provider Notes (Signed)
CSN: [id number]     Arrival date & time 01/07/15  1024 History   First MD Initiated Contact with Patient 01/07/15 1118     Chief Complaint  Patient presents with  . 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 Was seen for rash (thought to be recurrent herpes zoster) at left axilla on 01/04/2015 at [facility] ER along with mild discomfort with associated redness at both ears (this would appear to be recurrent issue upon review of previous records). Pt reports that the rash was not intially painful but now reports although rash @ left axilla is resolving she is having pain.  Pt reports that it started as blisters. Pt notes that her ears are burning. She states that she has tried her usual medication with no relief for her symptoms. She denies fever. Pt is still using illicit drugs at this time. Pt uses steroids and oxycodone for her vasculitis. Is requesting additional pain medication. She brings with her the paperwork and prescriptions (mobic, cephalexin and oxycodone) from 01/04/2015 ER visit and reports she has not yet had prescriptions filled because she states she does not have the $3.00 co-pay required to have these filled.  Lastly, she reports 1 week of painful swelling at right index finger PIP joint with associated purulent drainage over past 48 hours. States she poked finger with needle to promote drainage. Cannot recall specific initial injury. Tdap within past 1 year.  The history is provided by the patient.    Past Medical History  Diagnosis Date  . 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 Followed by Dr. Louanne Skye  . Hypertension   . Cocaine abuse    ongoing with resultant vaculitis.  . Depression   . Normocytic anemia     BL Hgb 9.8-12. Last anemia panel 04/2010 - showing Fe 19, ferritin 101.  Pt on monthly B12 injections  . 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 CAP (community acquired pneumonia) 03/2014 X 2  . Daily headache   . Migraines     "probably 5-6/yr" (04/21/2014)  . Rheumatoid arthritis(714.0)     patient reported  . Inflammatory arthritis    Past Surgical History  Procedure Laterality Date  . Skin biopsy Bilateral 04/2010    shin nodules  . Irrigation and debridement abscess Bilateral 09/26/2013    Procedure: DEBRIDEMENT ULCERS BILATERAL THIGHS;  Surgeon: Gwenyth Ober, MD;  Location: [facility];  Service: General;  Laterality: Bilateral;  . Hernia repair      "stomach"   Family History  Problem Relation Age of Onset  . Breast cancer Mother     Breast cancer  . Alcohol abuse Mother   . Colon cancer Maternal Aunt 60  . Alcohol abuse Father    History  Substance Use Topics  . Smoking status: Current Every Day Smoker -- 0.30 packs/day for 34 years    Types: Cigarettes  . Smokeless tobacco: Never Used     Comment: PATIENT HAS CUT BACK TO 5 CIGARETES A DAY  . Alcohol Use: No   OB History    No data available     Review of Systems  All other systems reviewed and are negative.  Allergies  Acetaminophen  Home Medications   Prior to Admission medications   Medication Sig Start Date End Date Taking? Authorizing Provider  aspirin 325 MG tablet Take 1 tablet (325 mg total) by mouth daily. Patient not taking: Reported on 12/15/2014 10/17/14   Kerrie Buffalo, NP  buPROPion (WELLBUTRIN XL) 150 MG 24 hr tablet Take 1 tablet (150 mg total) by mouth daily. Patient not taking: Reported on 12/15/2014 10/17/14   Kerrie Buffalo, NP  cephALEXin (KEFLEX) 500 MG capsule Take 1 capsule (500 mg total) by mouth 4 (four) times daily. 01/04/15   Charlann Lange, PA-C  hydrOXYzine (ATARAX/VISTARIL) 25  MG tablet Take 1 tablet (25 mg total) by mouth every 6 (six) hours as needed for itching or anxiety. 10/17/14   Kerrie Buffalo, NP  meloxicam (MOBIC) 7.5 MG tablet Take 1 tablet (7.5 mg total) by mouth daily. 01/04/15   Charlann Lange, PA-C  oxyCODONE (ROXICODONE) 5 MG immediate release tablet Take 1 tablet (5 mg total) by mouth every 4 (four) hours as needed for severe pain. 01/04/15   Charlann Lange, PA-C  traZODone (DESYREL) 50 MG tablet Take 1 tablet (50 mg total) by mouth at bedtime and may repeat dose one time if needed. Patient not taking: Reported on 12/15/2014 10/17/14   Kerrie Buffalo, NP   BP 115/75 mmHg  Pulse 109  Temp(Src) 99.4 F (37.4 C) (Oral)  Resp 16  SpO2 97% Physical Exam  Constitutional: She is oriented to person, place, and time. She is cooperative.  Non-toxic appearance. She does not have a sickly appearance. She does not appear ill. No distress.  +tearful. Appears thin and poorly nourished  HENT:  Head: Normocephalic and atraumatic.  Mild erythema and tenderness without evidence of ischemia or necrosis of helices of both ears  Eyes: Conjunctivae are normal.  Cardiovascular: Regular rhythm and normal heart sounds.   +mild tachycardia  Pulmonary/Chest: Effort normal and breath sounds normal. No respiratory distress. She has no wheezes.  Musculoskeletal: Normal range of motion.  Neurological: She is alert and oriented to person, place, and time.  Skin: Skin is warm and dry.  Entire skin area covered with countless hyperpigmented plaques and macules of variable sizes with evidence of dermal atrophy and psoriatic plaque type lesions at extensor surfaces of elbows.   Psychiatric: She has a normal mood and affect. Her speech is normal and behavior is normal. She expresses no homicidal and no suicidal ideation. She expresses no suicidal plans and no homicidal plans.  Occasionally tearful during exam  Nursing note and vitals reviewed.   ED Course  Procedures (including critical  care time) Labs Review Labs Reviewed - No data to display  Imaging Review Dg Finger Index Right  01/07/2015   CLINICAL DATA:  Swelling at PIP joint RIGHT index finger, had drainage and blood after sticking site with a needle, question osteomyelitis  EXAM: RIGHT INDEX FINGER 2+V  COMPARISON:  RIGHT hand radiographs 10/29/2010  FINDINGS: Soft tissue swelling centered at PIP joint RIGHT index finger, especially dorsally.  Osseous mineralization normal.  Joint spaces preserved.  No acute fracture, dislocation or bone destruction.  No soft tissue gas.  IMPRESSION: Soft tissue swelling without acute bony abnormalities.   Electronically Signed   By: Lavonia Dana M.D.   On: 01/07/2015 12:27     MDM   1. Polysubstance abuse   2. Rash   3. Abscess of finger, right    xray was without evidence of infection having affected the bone of right index  finger. will need to keep this area clean and either treat with warm soaks or warm compresses 4 times a day until healed. Patient has in her possession prescriptions for pain and infection  from your ER visit on 01/04/2015. Advised to make every effort to have these filled as these are what is needed to help with her symptoms. I have also arranged a follow up appointment at [facility] for 01/14/2015 @ 10:45am for follow up. Patient advised if symptoms become suddenly worse or severe, she may have yourself re-evaluated at her nearest ER. Given hx of active polysubstance abuse and hx of depression with suicide attempts, I advised patient that I will not be prescribing additional narcotic pain medications today. If she requires additional pain management, she will need to discuss this with her primary care doctor.     Lutricia Feil, [place] 01/07/15 [id number]

## 2015-01-07 NOTE — Discharge Instructions (Signed)
Your xray was without evidence of infection having affected the bone of your right index finger. You will need to keep this area clean and either treat with warm soaks or warm compresses 4 a day until healed. You have prescriptions for pain and infection in your possession from your ER visit on 01/04/2015. Make every effort to have these filled as these are what is needed to help with your symptoms. I have also arrange a follow up appointment at [facility] for 01/14/2015 @ 10:45am for follow up. If symptoms become suddenly worse or severe, your may have yourself re-evaluated at your nearest ER. We will not be prescribing additional narcotic pain medications today. If you require additional pain management, you will need to discuss this with your primary care doctor.  Abscess An abscess is an infected area that contains a collection of pus and debris.It can occur in almost any part of the body. An abscess is also known as a furuncle or boil. CAUSES  An abscess occurs when tissue gets infected. This can occur from blockage of oil or sweat glands, infection of hair follicles, or a minor injury to the skin. As the body tries to fight the infection, pus collects in the area and creates pressure under the skin. This pressure causes pain. People with weakened immune systems have difficulty fighting infections and get certain abscesses more often.  SYMPTOMS Usually an abscess develops on the skin and becomes a painful mass that is red, warm, and tender. If the abscess forms under the skin, you may feel a moveable soft area under the skin. Some abscesses break open (rupture) on their own, but most will continue to get worse without care. The infection can spread deeper into the body and eventually into the bloodstream, causing you to feel ill.  DIAGNOSIS  Your caregiver will take your medical history and perform a physical exam. A sample of fluid may also be taken from the abscess to determine what is  causing your infection. TREATMENT  Your caregiver may prescribe antibiotic medicines to fight the infection. However, taking antibiotics alone usually does not cure an abscess. Your caregiver may need to make a small cut (incision) in the abscess to drain the pus. In some cases, gauze is packed into the abscess to reduce pain and to continue draining the area. HOME CARE INSTRUCTIONS   Only take over-the-counter or prescription medicines for pain, discomfort, or fever as directed by your caregiver.  If you were prescribed antibiotics, take them as directed. Finish them even if you start to feel better.  If gauze is used, follow your caregiver's directions for changing the gauze.  To avoid spreading the infection:  Keep your draining abscess covered with a bandage.  Wash your hands well.  Do not share personal care items, towels, or whirlpools with others.  Avoid skin contact with others.  Keep your skin and clothes clean around the abscess.  Keep all follow-up appointments as directed by your caregiver. SEEK MEDICAL CARE IF:   You have increased pain, swelling, redness, fluid drainage, or bleeding.  You have muscle aches, chills, or a general ill feeling.  You have a fever. MAKE SURE YOU:   Understand these instructions.  Will watch your condition.  Will get help right away if you are not doing well or get worse. Document Released: 07/01/2005 Document Revised: 03/22/2012 Document Reviewed: 12/04/2011 [facility] Patient Information 2015 [place], [place]. This information is not intended to replace advice given to you by your  health care provider. Make sure you discuss any questions you have with your health care provider.

## 2015-01-07 NOTE — ED Notes (Addendum)
C/o rash "shingles" ., unable to get into her PCP offcie today. Has not filled Rx from 4-1 ED visit

## 2015-01-07 NOTE — Telephone Encounter (Signed)
Pt walked in to [facility] states was seen at [facility] ER - has shingles under left arm. There were no appts for today in clinic and pt states unable to come back on another day due to transportation. . Suggest for pt to go to Urgent Care or ER. Pt states has not filled Rx given to her from [facility] ER.  Hilda Blades Nekhi Liwanag RN 01/07/15 10:15AM

## 2015-01-10 ENCOUNTER — Telehealth: Payer: Self-pay | Admitting: Internal Medicine

## 2015-01-10 NOTE — Telephone Encounter (Signed)
Call to patient to confirm appointment for 01/14/15 at 10:45 lmtcb

## 2015-01-14 ENCOUNTER — Ambulatory Visit: Payer: Medicaid Other | Admitting: Internal Medicine

## 2015-01-14 ENCOUNTER — Inpatient Hospital Stay (HOSPITAL_COMMUNITY): Payer: Medicaid Other

## 2015-01-14 ENCOUNTER — Encounter: Payer: Self-pay | Admitting: Internal Medicine

## 2015-01-14 ENCOUNTER — Inpatient Hospital Stay (HOSPITAL_COMMUNITY)
Admission: AD | Admit: 2015-01-14 | Discharge: 2015-01-16 | DRG: 545 | Disposition: A | Discharge status: Home / Self Care | Payer: Medicaid Other | Source: Ambulatory Visit | Attending: Internal Medicine | Admitting: Internal Medicine

## 2015-01-14 VITALS — BP 128/66 | HR 97 | Temp 98.3°F | Resp 16 | Wt 91.1 lb

## 2015-01-14 DIAGNOSIS — L97529 Non-pressure chronic ulcer of other part of left foot with unspecified severity: Secondary | ICD-10-CM | POA: Diagnosis present

## 2015-01-14 DIAGNOSIS — L98499 Non-pressure chronic ulcer of skin of other sites with unspecified severity: Secondary | ICD-10-CM | POA: Diagnosis present

## 2015-01-14 DIAGNOSIS — E43 Unspecified severe protein-calorie malnutrition: Secondary | ICD-10-CM | POA: Diagnosis not present

## 2015-01-14 DIAGNOSIS — L97519 Non-pressure chronic ulcer of other part of right foot with unspecified severity: Secondary | ICD-10-CM | POA: Diagnosis present

## 2015-01-14 DIAGNOSIS — Z79899 Other long term (current) drug therapy: Secondary | ICD-10-CM | POA: Diagnosis not present

## 2015-01-14 DIAGNOSIS — I776 Arteritis, unspecified: Secondary | ICD-10-CM | POA: Diagnosis present

## 2015-01-14 DIAGNOSIS — M069 Rheumatoid arthritis, unspecified: Secondary | ICD-10-CM | POA: Diagnosis present

## 2015-01-14 DIAGNOSIS — B999 Unspecified infectious disease: Secondary | ICD-10-CM

## 2015-01-14 DIAGNOSIS — F329 Major depressive disorder, single episode, unspecified: Secondary | ICD-10-CM | POA: Diagnosis present

## 2015-01-14 DIAGNOSIS — F1410 Cocaine abuse, uncomplicated: Secondary | ICD-10-CM | POA: Diagnosis present

## 2015-01-14 DIAGNOSIS — I10 Essential (primary) hypertension: Secondary | ICD-10-CM | POA: Diagnosis not present

## 2015-01-14 DIAGNOSIS — F17210 Nicotine dependence, cigarettes, uncomplicated: Secondary | ICD-10-CM | POA: Diagnosis present

## 2015-01-14 DIAGNOSIS — M79671 Pain in right foot: Secondary | ICD-10-CM | POA: Diagnosis present

## 2015-01-14 DIAGNOSIS — T374X1A Poisoning by anthelminthics, accidental (unintentional), initial encounter: Secondary | ICD-10-CM | POA: Diagnosis present

## 2015-01-14 DIAGNOSIS — J3489 Other specified disorders of nose and nasal sinuses: Secondary | ICD-10-CM | POA: Diagnosis present

## 2015-01-14 DIAGNOSIS — F14988 Cocaine use, unspecified with other cocaine-induced disorder: Secondary | ICD-10-CM

## 2015-01-14 DIAGNOSIS — M7989 Other specified soft tissue disorders: Secondary | ICD-10-CM | POA: Diagnosis present

## 2015-01-14 DIAGNOSIS — Z681 Body mass index (BMI) 19 or less, adult: Secondary | ICD-10-CM

## 2015-01-14 DIAGNOSIS — I999 Unspecified disorder of circulatory system: Secondary | ICD-10-CM

## 2015-01-14 DIAGNOSIS — L97509 Non-pressure chronic ulcer of other part of unspecified foot with unspecified severity: Secondary | ICD-10-CM | POA: Diagnosis present

## 2015-01-14 DIAGNOSIS — L959 Vasculitis limited to the skin, unspecified: Secondary | ICD-10-CM

## 2015-01-14 DIAGNOSIS — L97511 Non-pressure chronic ulcer of other part of right foot limited to breakdown of skin: Secondary | ICD-10-CM

## 2015-01-14 LAB — CBC WITH DIFFERENTIAL/PLATELET
BASOS PCT: 0 % (ref 0–1)
Basophils Absolute: 0 10*3/uL (ref 0.0–0.1)
Eosinophils Absolute: 0 10*3/uL (ref 0.0–0.7)
Eosinophils Relative: 2 % (ref 0–5)
HCT: 35.5 % — ABNORMAL LOW (ref 36.0–46.0)
HEMOGLOBIN: 12 g/dL (ref 12.0–15.0)
Lymphocytes Relative: 43 % (ref 12–46)
Lymphs Abs: 1.1 10*3/uL (ref 0.7–4.0)
MCH: 29.1 pg (ref 26.0–34.0)
MCHC: 33.8 g/dL (ref 30.0–36.0)
MCV: 86.2 fL (ref 78.0–100.0)
Monocytes Absolute: 0.1 10*3/uL (ref 0.1–1.0)
Monocytes Relative: 3 % (ref 3–12)
NEUTROS PCT: 52 % (ref 43–77)
Neutro Abs: 1.4 10*3/uL — ABNORMAL LOW (ref 1.7–7.7)
PLATELETS: 310 10*3/uL (ref 150–400)
RBC: 4.12 MIL/uL (ref 3.87–5.11)
RDW: 13.4 % (ref 11.5–15.5)
WBC: 2.6 10*3/uL — ABNORMAL LOW (ref 4.0–10.5)

## 2015-01-14 LAB — COMPREHENSIVE METABOLIC PANEL
ALBUMIN: 3.4 g/dL — AB (ref 3.5–5.2)
ALT: 10 U/L (ref 0–35)
ANION GAP: 9 (ref 5–15)
AST: 23 U/L (ref 0–37)
Alkaline Phosphatase: 59 U/L (ref 39–117)
BUN: 9 mg/dL (ref 6–23)
CHLORIDE: 103 mmol/L (ref 96–112)
CO2: 28 mmol/L (ref 19–32)
Calcium: 9 mg/dL (ref 8.4–10.5)
Creatinine, Ser: 1.05 mg/dL (ref 0.50–1.10)
GFR calc non Af Amer: 60 mL/min — ABNORMAL LOW (ref >90)
GFR, EST AFRICAN AMERICAN: 70 mL/min — AB (ref >90)
Glucose, Bld: 147 mg/dL — ABNORMAL HIGH (ref 70–99)
Potassium: 3.3 mmol/L — ABNORMAL LOW (ref 3.5–5.1)
SODIUM: 140 mmol/L (ref 135–145)
TOTAL PROTEIN: 6.9 g/dL (ref 6.0–8.3)
Total Bilirubin: 0.5 mg/dL (ref 0.3–1.2)

## 2015-01-14 LAB — RAPID URINE DRUG SCREEN, HOSP PERFORMED
Amphetamines: NOT DETECTED
BARBITURATES: NOT DETECTED
BENZODIAZEPINES: NOT DETECTED
COCAINE: POSITIVE — AB
Opiates: NOT DETECTED
Tetrahydrocannabinol: NOT DETECTED

## 2015-01-14 LAB — MRSA PCR SCREENING: MRSA by PCR: NEGATIVE

## 2015-01-14 MED ORDER — OXYCODONE HCL 5 MG PO TABS
5.0000 mg | ORAL_TABLET | ORAL | Status: DC | PRN
  Administered 2015-01-14 – 2015-01-16 (×8): 5 mg via ORAL
  Filled 2015-01-14 (×8): qty 1

## 2015-01-14 MED ORDER — CEFTRIAXONE SODIUM IN DEXTROSE 20 MG/ML IV SOLN
1.0000 g | INTRAVENOUS | Status: DC
  Administered 2015-01-14 – 2015-01-15 (×2): 1 g via INTRAVENOUS
  Filled 2015-01-14 (×2): qty 50

## 2015-01-14 MED ORDER — ASPIRIN 325 MG PO TABS
325.0000 mg | ORAL_TABLET | Freq: Every day | ORAL | Status: DC
  Administered 2015-01-14 – 2015-01-16 (×3): 325 mg via ORAL
  Filled 2015-01-14 (×3): qty 1

## 2015-01-14 MED ORDER — HYDROXYZINE HCL 25 MG PO TABS
25.0000 mg | ORAL_TABLET | Freq: Four times a day (QID) | ORAL | Status: DC | PRN
  Administered 2015-01-16: 25 mg via ORAL
  Filled 2015-01-14: qty 1

## 2015-01-14 MED ORDER — TRAZODONE HCL 50 MG PO TABS
50.0000 mg | ORAL_TABLET | Freq: Every evening | ORAL | Status: DC | PRN
  Administered 2015-01-14 – 2015-01-16 (×4): 50 mg via ORAL
  Filled 2015-01-14 (×4): qty 1

## 2015-01-14 MED ORDER — HEPARIN SODIUM (PORCINE) 5000 UNIT/ML IJ SOLN
5000.0000 [IU] | Freq: Three times a day (TID) | INTRAMUSCULAR | Status: DC
  Administered 2015-01-14 – 2015-01-16 (×6): 5000 [IU] via SUBCUTANEOUS
  Filled 2015-01-14 (×6): qty 1

## 2015-01-14 MED ORDER — BUPROPION HCL ER (XL) 150 MG PO TB24
150.0000 mg | ORAL_TABLET | Freq: Every day | ORAL | Status: DC
  Administered 2015-01-14 – 2015-01-16 (×3): 150 mg via ORAL
  Filled 2015-01-14 (×3): qty 1

## 2015-01-14 MED ORDER — POTASSIUM CHLORIDE CRYS ER 20 MEQ PO TBCR
40.0000 meq | EXTENDED_RELEASE_TABLET | Freq: Once | ORAL | Status: AC
  Administered 2015-01-14: 40 meq via ORAL
  Filled 2015-01-14: qty 2

## 2015-01-14 MED ORDER — ENOXAPARIN SODIUM 40 MG/0.4ML ~~LOC~~ SOLN: 40.0000 mg | SUBCUTANEOUS | Status: DC

## 2015-01-14 NOTE — Assessment & Plan Note (Addendum)
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 - Will admit to the hospital for further evaluation and management.  She has previously been treated with IV steroids and pain medications and she is a poor candidate for outpatient management given addition concern of infections of her finger and right foot. - Will expidite admission with orders for CBC w/diff, CMP and urine Tox.

## 2015-01-14 NOTE — Assessment & Plan Note (Signed)
-  Patient reports she has completed a course of keflex with some improvement.  She does have note ongoing purulent drainage which is concerning. - Patient is being admitted to the inpatient service for further eval and management.

## 2015-01-14 NOTE — Progress Notes (Signed)
[facility] Subjective:   [name]   DOB: 02-Feb-1961 54 y.o.   MRN: [id number]  HPI: [name] is a 54 y.o. female with a PMH detailed below and notable for an extensive history of levimasole vasculitis who presents for an acute visit for ulcers on right foot and ear and nose pain.  Right foot ulcers: she reports she has had two ulcers on her right foot for the last 2 weeks, she has not tried to treat them with anything, she does report that the ulcer on the bottom of her foot burst last night and has white drainage.  Ear and nose pain: Patient is tearful and reports 10/10 pain in her bilateral ears as well as nose.  This pain started 3 days ago.  This pain is consistent with pain she has had previously from [place] vasculitis.  She report she has been using cocaine and last cocaine use was 2 days ago.    Wound of right index finger: She went to urgent care on 01/07/15 and was noted to have a wound on her right index finger, Xrays were obtained that showed no acute bony abnormalities.  She was prescribed a course of keflex as well as mobic.  Rash over both arms: Was seen in ED on 01/04/15, patient was concerned that this could be shingles, ED provider felt it was likely chronic vasculitis as documented in her chart.  She reports the lesions are healing on her arms and pain is improved.    Past Medical History  Diagnosis Date  . 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 Followed by Dr. Louanne Skye  . Hypertension   . Cocaine abuse     ongoing with resultant vaculitis.  . Depression   . Normocytic anemia     BL Hgb 9.8-12. Last anemia panel 04/2010 - showing Fe 19, ferritin 101.  Pt on monthly B12 injections  . 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 CAP (community acquired pneumonia) 03/2014 X 2  . Daily headache   . Migraines     "probably 5-6/yr" (04/21/2014)  . Rheumatoid arthritis(714.0)     patient  reported  . Inflammatory arthritis    No current outpatient prescriptions on file.   No current facility-administered medications for this visit.   Family History  Problem Relation Age of Onset  . Breast cancer Mother     Breast cancer  . Alcohol abuse Mother   . Colon cancer Maternal Aunt 65  . Alcohol abuse Father    History   Social History  . Marital Status: Single    Spouse Name: N/A  . Number of Children: 0  . Years of Education: 11th grade   Occupational History  . Disability     since 2011, due to her rheumatoid arthritis   Social History Main Topics  . Smoking status: Current Every Day Smoker -- 0.30 packs/day for 34 years    Types: Cigarettes  . Smokeless tobacco: Never Used     Comment: PATIENT HAS CUT BACK TO 5 CIGARETES A DAY  . Alcohol Use: No  . Drug Use: Yes    Special: "Crack" cocaine, Cocaine, Marijuana     Comment: 04/21/2014 "last used cocaine 02/2014;still smoke a little marijuana q now and then"  . Sexual Activity: No   Other Topics Concern  . None   Social History Narrative   Unemployed:  cleaning in  past   Living at [place]; has Medicaid.   crack/cocaine use; pt denies IVDU   tobacco:  1/2 ppd, trying to quit   alcohol:  none       Review of Systems: Review of Systems  Constitutional: Negative for fever and chills.     Objective:  Physical Exam: Filed Vitals:   01/14/15 1103 01/14/15 1151  BP: 196/96 128/66  Pulse: 112 97  Temp: 98.3 F (36.8 C)   TempSrc: Oral   Resp: 16   Weight: 91 lb 1.6 oz (41.323 kg)   SpO2: 97%    Physical Exam  Constitutional: She appears distressed (crying and aggitated.).  HENT:  Right and left ears with chronic changes associated with vasculatitis as well as acute aspect of darkened painful skin.  She has darkened painful nodule at the tip of her nostril and has a perforated nasal septum  Eyes: Conjunctivae are normal.  Cardiovascular: Normal heart sounds.   No murmur heard. tachycardic    Pulmonary/Chest: Effort normal and breath sounds normal. No respiratory distress. She has no wheezes.  Musculoskeletal:  Swollen right index IP joint, not erythematous, no active drainage, see photo.   Skin:  1cm superfical ulcer on  plantar aspect of right foot, swollen superfical ulcer and swelling under 2nd digit at plantar aspect of right foot without active drainage.   Nursing note and vitals reviewed.                  Assessment & Plan:  Case discussed with Dr. 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 - Will admit to the hospital for further evaluation and management.  She has previously been treated with IV steroids and pain medications and she is a poor candidate for outpatient management given addition concern of infections of her finger and right foot. - Will expidite admission with orders for CBC w/diff, CMP and urine Tox.   Swelling of finger, right -Patient reports she has completed a course of keflex with some improvement.  She does have note ongoing purulent drainage which is concerning. - Patient is being admitted to the inpatient service for further eval and management.   Foot ulceration Patient has two foot ulcers that have been ongoing for 2 weeks, low likelihood of osteomylelitis but the plantar ulcer is difficult to visualize debth. - Ordered CBC w/ diff may consider ESR or CRP and or MRI but will defer to inpatient team.     Medications Ordered No orders of the defined types were placed in this encounter.   Other Orders No orders of the defined types were placed in this encounter.

## 2015-01-14 NOTE — Progress Notes (Signed)
ANTIBIOTIC CONSULT NOTE - INITIAL  Pharmacy Consult for Ceftriaxone Indication: pneumonia  Allergies  Allergen Reactions  . Acetaminophen Swelling    REACTION: eyelid swelling    Patient Measurements: Height: 5' 1"  (154.9 cm) Weight: 88 lb 6.5 oz (40.1 kg) IBW/kg (Calculated) : 47.8  Vital Signs: Temp: 98.3 F (36.8 C) (04/11 1300) Temp Source: Oral (04/11 1300) BP: 166/94 mmHg (04/11 1300) Pulse Rate: 86 (04/11 1300) Intake/Output from previous day:   Intake/Output from this shift:    Labs:  Recent Labs  01/14/15 1637  WBC 2.6*  HGB 12.0  PLT 310  CREATININE 1.05   Estimated Creatinine Clearance: 40.1 mL/min (by C-G formula based on Cr of 1.05). No results for input(s): VANCOTROUGH, VANCOPEAK, VANCORANDOM, GENTTROUGH, GENTPEAK, GENTRANDOM, TOBRATROUGH, TOBRAPEAK, TOBRARND, AMIKACINPEAK, AMIKACINTROU, AMIKACIN in the last 72 hours.   Microbiology: No results found for this or any previous visit (from the past 720 hour(s)).  Medical History: Past Medical History  Diagnosis Date  . 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 Followed by Dr. Louanne Skye  . Hypertension   . Cocaine abuse     ongoing with resultant vaculitis.  . Depression   . Normocytic anemia     BL Hgb 9.8-12. Last anemia panel 04/2010 - showing Fe 19, ferritin 101.  Pt on monthly B12 injections  . 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 CAP (community acquired pneumonia) 03/2014 X 2  . Daily headache   . Migraines     "probably 5-6/yr" (04/21/2014)  . Rheumatoid arthritis(714.0)     patient reported  . Inflammatory arthritis     Medications:  Prescriptions prior to admission  Medication Sig Dispense Refill Last Dose  . cephALEXin (KEFLEX) 500 MG capsule Take 1 capsule (500 mg total) by mouth 4 (four) times daily. 20 capsule 0 01/14/2015 at Unknown time  . meloxicam (MOBIC) 7.5 MG tablet Take 1 tablet (7.5 mg total) by mouth daily. 15 tablet 0 01/14/2015 at Unknown  time  . aspirin 325 MG tablet Take 1 tablet (325 mg total) by mouth daily. (Patient not taking: Reported on 12/15/2014)   Not Taking at Unknown time  . buPROPion (WELLBUTRIN XL) 150 MG 24 hr tablet Take 1 tablet (150 mg total) by mouth daily. (Patient not taking: Reported on 12/15/2014) 30 tablet 0 Not Taking at Unknown time  . hydrOXYzine (ATARAX/VISTARIL) 25 MG tablet Take 1 tablet (25 mg total) by mouth every 6 (six) hours as needed for itching or anxiety. (Patient not taking: Reported on 01/14/2015) 30 tablet 0 Not Taking at Unknown time  . oxyCODONE (ROXICODONE) 5 MG immediate release tablet Take 1 tablet (5 mg total) by mouth every 4 (four) hours as needed for severe pain. (Patient not taking: Reported on 01/14/2015) 3 tablet 0 Not Taking at Unknown time  . traZODone (DESYREL) 50 MG tablet Take 1 tablet (50 mg total) by mouth at bedtime and may repeat dose one time if needed. (Patient not taking: Reported on 12/15/2014) 30 tablet 0 Not Taking at Unknown time   Assessment: 54 yo F admitted 01/14/2015 with concern for pneumonia.  Pharmacy consulted to dose ceftriaxone.  Goal of Therapy:  Resolution of infection  Plan:  Ceftraixone 1g IV q24h Renal adjustment not indicated.  Pharmacy will sign off at this time.  Please call for questions.   [name] C 01/14/2015,6:06 PM

## 2015-01-14 NOTE — Assessment & Plan Note (Signed)
Patient has two foot ulcers that have been ongoing for 2 weeks, low likelihood of osteomylelitis but the plantar ulcer is difficult to visualize debth. - Ordered CBC w/ diff may consider ESR or CRP and or MRI but will defer to inpatient team.

## 2015-01-14 NOTE — Progress Notes (Signed)
Case discussed with Dr. Hoffman soon after the resident saw the patient.  We reviewed the resident's history and exam and pertinent patient test results.  I agree with the assessment, diagnosis, and plan of care documented in the resident's note. 

## 2015-01-14 NOTE — H&P (Signed)
Date: 01/14/2015               Patient Name:  [name] MRN: [id number]  DOB: 1961/05/07 Age / Sex: 54 y.o., female   PCP: Charlesetta Shanks, MD         Medical Service: Internal Medicine Teaching Service         Attending Physician: Dr. Bertha Stakes, MD    First Contact: Dr. Albin Felling Pager: [phone]  Second Contact: Dr. Juluis Mire Pager: [phone]       After Hours (After 5p/  First Contact Pager: [phone]  weekends / holidays): Second Contact Pager: [phone]   Chief Complaint: Ulcers on foot and ear and nose pain  History of Present Illness: [name] is a 54yo woman with PMHx of vasculitis secondary to levimasole toxicity, cocaine abuse, HTN, and depression who presents with ulcers on her right foot, ear, nose, and right index finger. 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 She reports the ulcers on her foot started about 4-5 days ago and the ulcers on her ear and nose started only 2-3 days ago. She describes associated 10/10 pain that has not been relieved with Mobic. She states she went to an urgent care for her right index finger swelling about 1 week ago and was prescribed Keflex which she completed. She reports yellow pus is draining from her right foot ulcers and from her right index finger. She admits to cocaine use a few days ago because she was feeling depressed about her family. Of note, she was hospitalized at [facility] for 1 week in January for severe depression and associated marijuana/cocaine use. Patient also notes lesions on both of her legs that have been present for several months. She states they started off as red areas and have now "crusted" over. She notes associated pruritis with both the new and old lesions. She denies fever, chills, nausea, and vomiting.   Meds: No current facility-administered medications for this encounter.    Allergies: Allergies as of 01/14/2015 - Review Complete 01/14/2015  Allergen Reaction Noted    . Acetaminophen Swelling    Past Medical History  Diagnosis Date  . 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 Followed by Dr. Louanne Skye  . Hypertension   . Cocaine abuse     ongoing with resultant vaculitis.  . Depression   . Normocytic anemia     BL Hgb 9.8-12. Last anemia panel 04/2010 - showing Fe 19, ferritin 101.  Pt on monthly B12 injections  . 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 CAP (community acquired pneumonia) 03/2014 X 2  . Daily headache   . Migraines     "probably 5-6/yr" (04/21/2014)  . Rheumatoid arthritis(714.0)     patient reported  . Inflammatory arthritis    Past Surgical History  Procedure Laterality Date  . Skin biopsy Bilateral 04/2010    shin nodules  . Irrigation and debridement abscess Bilateral 09/26/2013    Procedure: DEBRIDEMENT ULCERS BILATERAL THIGHS;  Surgeon: Gwenyth Ober, MD;  Location: [place];  Service: General;  Laterality: Bilateral;  . Hernia repair      "stomach"   Family History  Problem Relation Age of Onset  . Breast cancer Mother     Breast cancer  . Alcohol abuse Mother   . Colon cancer Maternal Aunt 69  . Alcohol abuse Father    History   Social History  .  Marital Status: Single    Spouse Name: N/A  . Number of Children: 0  . Years of Education: 11th grade   Occupational History  . Disability     since 2011, due to her rheumatoid arthritis   Social History Main Topics  . Smoking status: Current Every Day Smoker -- 0.30 packs/day for 34 years    Types: Cigarettes  . Smokeless tobacco: Never Used     Comment: PATIENT HAS CUT BACK TO 5 CIGARETES A DAY  . Alcohol Use: No  . Drug Use: Yes    Special: "Crack" cocaine, Cocaine, Marijuana     Comment: 04/21/2014 "last used cocaine 02/2014;still smoke a little marijuana q now and then"  . Sexual Activity: No   Other Topics Concern  . Not on file   Social History Narrative   Unemployed:  cleaning in past   Living at [place]; has  Medicaid.   crack/cocaine use; pt denies IVDU   tobacco:  1/2 ppd, trying to quit   alcohol:  none        Review of Systems: General: Denies fever, chills, night sweats, changes in weight, changes in appetite HEENT: Denies headaches, ear pain, changes in vision, rhinorrhea, sore throat CV: Denies CP, palpitations, SOB, orthopnea Pulm: Denies SOB, cough, wheezing GI: Denies abdominal pain, nausea, vomiting, diarrhea, constipation, melena, hematochezia GU: Denies dysuria, hematuria, frequency Msk: Denies muscle cramps, joint pains Neuro: Denies weakness, numbness, tingling Skin: Reports ulcers. Denies bruising  Physical Exam: Blood pressure 166/94, pulse 86, temperature 98.3 F (36.8 C), temperature source Oral, resp. rate 17, height 5' 1"  (1.549 m), weight 88 lb 6.5 oz (40.1 kg), SpO2 100 %. General: alert, sitting up in bed, anxious HEENT: [place]/AT, EOMI, small darkened area at tip of nose and a perforated nasal septum. Right and left ears have chronic sores associated with vasculitis that are painful to palpation.  CV: RRR, no m/g/r Pulm: CTA bilaterally, breaths non-labored Abd: BS+, soft, non-tender, non-distended Ext: Swollen right index finger with small amount of pus present, no erythema. Ulcer present on dorsum aspect of right foot which appears superficial. Another ulcer is present on the 2nd digit at plantar aspect of right foot with minimal pus-like drainage.  Neuro: alert and oriented x 3, no focal deficits   Assessment & Plan by Problem: Active Problems:   Vasculitis of skin  Vasculitis 2/2 Levimasole Toxicity: Patient admitted to recent cocaine abuse that likely exacerbated her known vasculitis secondary to levimasole. Her ulcers on her right foot and right index finger appear infected with pus draining. Her WBC count is decreased at 2.6. She is afebrile. Will not start steroids at this time as she likely has an infection in her foot and hand wounds. Will get imaging to  further assess for possible osteomyelitis.  - Start Ceftriaxone  - X-ray of right foot and right hand to assess for possible osteomyelitis - Consult to wound care  - Oxycodone IR 5 mg PO Q4H PRN - Check HIV antibody - f/u UDS  HTN: BP 166/94 on admission. Could be elevated secondary to pain. She is not on any home antihypertensives. She has been on Lisinopril in the past.  - Consider adding antihypertensive if SBP > 180  Depression: Patient's mood is currently stable. She was recently hospitalized at [facility] for major depression and substance abuse including marijuana and cocaine. Patient admits to recent cocaine use.  - Continue home Bupropion 150 mg daily  - Continue home Trazodone 50 mg  QHS - Continue home Hydroxyzine 25 mg daily   Diet: heart healthy  VTE Ppx: Heparin SQ Dispo: Disposition is deferred at this time, awaiting improvement of current medical problems. Anticipated discharge in approximately 1-2 day(s).   The patient does have a current PCP Charlesetta Shanks, MD) and does need an [facility] hospital follow-up appointment after discharge.  The patient does not have transportation limitations that hinder transportation to clinic appointments.  Signed: Juliet Rude, MD 01/14/2015, 2:19 PM

## 2015-01-15 ENCOUNTER — Encounter (HOSPITAL_COMMUNITY): Payer: Self-pay | Admitting: General Practice

## 2015-01-15 DIAGNOSIS — F1410 Cocaine abuse, uncomplicated: Secondary | ICD-10-CM | POA: Diagnosis present

## 2015-01-15 DIAGNOSIS — I10 Essential (primary) hypertension: Secondary | ICD-10-CM

## 2015-01-15 DIAGNOSIS — L959 Vasculitis limited to the skin, unspecified: Secondary | ICD-10-CM

## 2015-01-15 DIAGNOSIS — F329 Major depressive disorder, single episode, unspecified: Secondary | ICD-10-CM

## 2015-01-15 DIAGNOSIS — E43 Unspecified severe protein-calorie malnutrition: Secondary | ICD-10-CM | POA: Diagnosis present

## 2015-01-15 LAB — BASIC METABOLIC PANEL
ANION GAP: 10 (ref 5–15)
BUN: 15 mg/dL (ref 6–23)
CO2: 26 mmol/L (ref 19–32)
Calcium: 8.8 mg/dL (ref 8.4–10.5)
Chloride: 105 mmol/L (ref 96–112)
Creatinine, Ser: 0.8 mg/dL (ref 0.50–1.10)
GFR, EST NON AFRICAN AMERICAN: 84 mL/min — AB (ref >90)
Glucose, Bld: 95 mg/dL (ref 70–99)
POTASSIUM: 4.3 mmol/L (ref 3.5–5.1)
Sodium: 141 mmol/L (ref 135–145)

## 2015-01-15 LAB — CBC
HCT: 32.8 % — ABNORMAL LOW (ref 36.0–46.0)
Hemoglobin: 11.5 g/dL — ABNORMAL LOW (ref 12.0–15.0)
MCH: 30.5 pg (ref 26.0–34.0)
MCHC: 35.1 g/dL (ref 30.0–36.0)
MCV: 87 fL (ref 78.0–100.0)
PLATELETS: 282 10*3/uL (ref 150–400)
RBC: 3.77 MIL/uL — ABNORMAL LOW (ref 3.87–5.11)
RDW: 13.7 % (ref 11.5–15.5)
WBC: 2.6 10*3/uL — ABNORMAL LOW (ref 4.0–10.5)

## 2015-01-15 LAB — HIV ANTIBODY (ROUTINE TESTING W REFLEX): HIV Screen 4th Generation wRfx: NONREACTIVE

## 2015-01-15 LAB — MAGNESIUM: Magnesium: 1.9 mg/dL (ref 1.5–2.5)

## 2015-01-15 LAB — PHOSPHORUS: PHOSPHORUS: 3.8 mg/dL (ref 2.3–4.6)

## 2015-01-15 MED ORDER — ENSURE ENLIVE PO LIQD
237.0000 mL | Freq: Two times a day (BID) | ORAL | Status: DC
  Administered 2015-01-15 – 2015-01-16 (×4): 237 mL via ORAL

## 2015-01-15 NOTE — Progress Notes (Signed)
Subjective: Patient reports continued pain in her right foot and right index finger. There appears to be sanguinous drainage, no longer pus from her ulcers.   Objective: Vital signs in last 24 hours: Filed Vitals:   01/14/15 1300 01/14/15 2100 01/15/15 0552  BP: 166/94 137/73 145/69  Pulse: 86 80 77  Temp: 98.3 F (36.8 C) 98.7 F (37.1 C) 98.6 F (37 C)  TempSrc: Oral Oral Oral  Resp: 17 18 19   Height: 5' 1"  (1.549 m)    Weight: 88 lb 6.5 oz (40.1 kg)    SpO2: 100% 100% 100%   Weight change:   Intake/Output Summary (Last 24 hours) at 01/15/15 1311 Last data filed at 01/14/15 1903  Gross per 24 hour  Intake    360 ml  Output      0 ml  Net    360 ml   Physical Exam General: alert, sitting up in bed, NAD HEENT: [place]/AT, EOMI. Small darkened area at tip of nose and a perforated nasal septum. Right and left ears have chronic sores associated with vasculitis that are painful to palpation. CV: RRR, no m/g/r Pulm: CTA bilaterally, breaths non-labored Abd: BS+, soft, non-tender, non-distended  Ext: Swollen right index finger with no erythema or drainage. Ulcer present on dorsum aspect of right foot which appears superficial. Another ulcer is present on the 2nd digit at plantar aspect of right foot with sanguinous drainage present Neuro: alert and oriented x 3, no focal deficits   Lab Results: Basic Metabolic Panel:  Recent Labs Lab 01/14/15 1637 01/15/15 0437  NA 140 141  K 3.3* 4.3  CL 103 105  CO2 28 26  GLUCOSE 147* 95  BUN 9 15  CREATININE 1.05 0.80  CALCIUM 9.0 8.8  MG  --  1.9  PHOS  --  3.8   Liver Function Tests:  Recent Labs Lab 01/14/15 1637  AST 23  ALT 10  ALKPHOS 59  BILITOT 0.5  PROT 6.9  ALBUMIN 3.4*   CBC:  Recent Labs Lab 01/14/15 1637 01/15/15 0437  WBC 2.6* 2.6*  NEUTROABS 1.4*  --   HGB 12.0 11.5*  HCT 35.5* 32.8*  MCV 86.2 87.0  PLT 310 282    Studies/Results: Dg Hand Complete Right  01/14/2015   CLINICAL DATA:   Evaluate for infection of the right hand.  EXAM: RIGHT HAND - COMPLETE 3+ VIEW  COMPARISON:  And radiograph 10/29/2010  FINDINGS: Normal anatomic alignment. No evidence for acute fracture or dislocation. No significant soft tissue swelling. No definite destructive osseous lesion identified.  IMPRESSION: No acute osseous abnormality.   Electronically Signed   By: Lovey Newcomer M.D.   On: 01/14/2015 18:54   Dg Foot Complete Right  01/14/2015   CLINICAL DATA:  Right foot pain and swelling for 1 month, infection  EXAM: RIGHT FOOT COMPLETE - 3+ VIEW  COMPARISON:  04/15/2013  FINDINGS: Marked hallux valgus deformity is noted. Swelling of the soft tissue is identified at the level of the fifth metatarsal-phalangeal joint. No radiopaque foreign body. No osseous destruction or fracture.  IMPRESSION: No osseous destruction or fracture. Mild soft tissue swelling is noted adjacent to the fifth metatarsophalangeal joint.   Electronically Signed   By: Conchita Paris M.D.   On: 01/14/2015 18:53   Medications: I have reviewed the patient's current medications.  Assessment/Plan: Principal Problem:   Vasculitis of skin Active Problems:   Protein-calorie malnutrition, severe  Vasculitis 2/2 Levimasole Toxicity: Patient admitted to recent cocaine abuse that  likely exacerbated her known vasculitis secondary to levimasole. X-rays of her right hand and foot do not show evidence of osteomyelitis. Wound care evaluated patient and recommended for her to use silver gel which has worked well in the past. Will consult ortho to determine if further work up needed for her ulcers. Ultimately, the best treatment option for her vasculitis is for her to abstain from cocaine. However given her history she will likely continue to use. - Ortho consulted, appreciate recommendations  - Continue Ceftriaxone, transition to PO tomorrow   - Appreciate wound care's evaluation - Oxycodone IR 5 mg PO Q4H PRN - Check HIV antibody>> non-reactive     HTN: BP 145/69. Could be elevated secondary to pain. She is not on any home antihypertensives. She has been on Lisinopril in the past.  - Consider adding antihypertensive if SBP > 180  Depression: Patient's mood is currently stable. She was recently hospitalized at [facility] for major depression and substance abuse including marijuana and cocaine. Patient admits to recent cocaine use.  - Continue home Bupropion 150 mg daily  - Continue home Trazodone 50 mg QHS - Continue home Hydroxyzine 25 mg daily   Diet: Heart healthy  VTE Ppx: Heparin SQ Dispo: Disposition is deferred at this time, awaiting improvement of current medical problems. Anticipated discharge in approximately 1 day.   The patient does have a current PCP Charlesetta Shanks, MD) and does need an [facility] hospital follow-up appointment after discharge.  The patient does not have transportation limitations that hinder transportation to clinic appointments.  .Services Needed at time of discharge: Y = Yes, Blank = No PT:   OT:   RN:   Equipment:   Other:     LOS: 1 day   Juliet Rude, MD 01/15/2015, 1:11 PM

## 2015-01-15 NOTE — Consult Note (Signed)
WOC wound consult note Pt well known to this [place] from previous admissions, however I have not seen her lately. She reports she is still smoking crack and she is much, much thinner than the last time I saw her. She reports she has permanent housing now, however they do not allow her to get high so sometimes she sleeps out side with friends who are also users.  Her ears are essentially the same as they have been in the past, necrotic lesions are worse on the left ear and this one is painful to the touch today. She has multiple scars from lesions on her arms and legs and some on her face.  She has open wounds on the right foot both dorsal and plantar surface that are oozing sanguineous drainage and very painful. She report intense throbbing pain in the foot with ambulation. Wound type: levamisole related vasculitis  Measurement: dorsal foot and plantar foot aprox. 2cm x 2cm x 0.2cm  Wound bed: bloody, unable to assess due to pain with manipulation  Drainage (amount, consistency, odor) sanguinous, no odor Periwound: intact Dressing procedure/placement/frequency: Silver gel (provided by the [facility] nurse) to the ear wounds, this has helped in the past.  Calcium alginate cut to fit and placed on the right foot open wounds for its hemostatic properties and to absorb excess exudate covered with foam.  As always reminded patient that her continued use of cocaine will intensify the occurrence of these ulcers.  Reminded her that nutrition is an important aspect of healing her wounds. She verbalized understanding.  Discussed POC with patient and bedside nurse.  Re consult if needed, will not follow at this time. Thanks  Oluwaferanmi Wain [place], [place] [phone])

## 2015-01-15 NOTE — Care Management Note (Signed)
  Page 1 of 1   01/15/2015     11:20:00 AM CARE MANAGEMENT NOTE 01/15/2015  Patient:  [name], [name]   Account Number:  [id number]  Date Initiated:  01/15/2015  Documentation initiated by:  Magdalen Spatz  Subjective/Objective Assessment:     Action/Plan:   Anticipated DC Date:  01/16/2015   Anticipated DC Plan:  HOME/SELF CARE         Choice offered to / List presented to:             Status of service:   Medicare Important Message given?   (If response is "NO", the following Medicare IM given date fields will be blank) Date Medicare IM given:   Medicare IM given by:   Date Additional Medicare IM given:   Additional Medicare IM given by:    Discharge Disposition:    Per UR Regulation:  Reviewed for med. necessity/level of care/duration of stay  If discussed at [place] of Stay Meetings, dates discussed:    Comments:  01-15-15 Consult for Medication needs.  Confirmed with patient she has Medicaid and co pay is $3 . NCM unable to assist further . Patient voiced understanding. Magdalen Spatz RN BSN [phone]

## 2015-01-15 NOTE — Consult Note (Signed)
[name] MRN: [id number] DOB/AGE: 1961-08-31 54 y.o.  Admit date: 01/14/2015  Admission Diagnoses:  Principal Problem:   Vasculitis of skin Active Problems:   Protein-calorie malnutrition, severe   HPI: Patient with long hx of drug use is being seen for vasculitis affecting right foot and right index finger.  Also has know hx of vasculitis. Hospital notes reviewed by me and my attending Dr Rodell Perna.  Being followed by wound care nurse.  C/o right foot pain.    Past Medical History: Past Medical History  Diagnosis Date  . 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 Followed by Dr. Louanne Skye  . Hypertension   . Cocaine abuse     ongoing with resultant vaculitis.  . Depression   . Normocytic anemia     BL Hgb 9.8-12. Last anemia panel 04/2010 - showing Fe 19, ferritin 101.  Pt on monthly B12 injections  . 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 CAP (community acquired pneumonia) 03/2014 X 2  . Daily headache   . Migraines     "probably 5-6/yr" (04/21/2014)  . Rheumatoid arthritis(714.0)     patient reported  . Inflammatory arthritis     Surgical History: Past Surgical History  Procedure Laterality Date  . Skin biopsy Bilateral 04/2010    shin nodules  . Irrigation and debridement abscess Bilateral 09/26/2013    Procedure: DEBRIDEMENT ULCERS BILATERAL THIGHS;  Surgeon: Gwenyth Ober, MD;  Location: [place];  Service: General;  Laterality: Bilateral;  . Hernia repair      "stomach"    Family History: Family History  Problem Relation Age of Onset  . Breast cancer Mother     Breast cancer  . Alcohol abuse Mother   . Colon cancer Maternal Aunt 81  . Alcohol abuse Father     Social History: History   Social History  . Marital Status: Single    Spouse Name: N/A  . Number of Children: 0  . Years of Education: 11th grade   Occupational History  . Disability     since 2011, due to her rheumatoid arthritis   Social  History Main Topics  . Smoking status: Current Every Day Smoker -- 0.30 packs/day for 34 years    Types: Cigarettes  . Smokeless tobacco: Never Used     Comment: PATIENT HAS CUT BACK TO 5 CIGARETES A DAY  . Alcohol Use: No  . Drug Use: Yes    Special: "Crack" cocaine, Cocaine, Marijuana     Comment: 04/21/2014 "last used cocaine 02/2014;still smoke a little marijuana q now and then"  . Sexual Activity: No   Other Topics Concern  . Not on file   Social History Narrative   Unemployed:  cleaning in past   Living at [place]; has Medicaid.   crack/cocaine use; pt denies IVDU   tobacco:  1/2 ppd, trying to quit   alcohol:  none        Allergies: Acetaminophen  Medications: I have reviewed the patient's current medications.  Vital Signs: Patient Vitals for the past 24 hrs:  BP Temp Temp src Pulse Resp SpO2  01/15/15 1429 (!) 165/73 mmHg 99 F (37.2 C) Oral 80 18 100 %  01/15/15 0552 (!) 145/69 mmHg 98.6 F (37 C) Oral 77 19 100 %  01/14/15 2100 137/73 mmHg 98.7 F (37.1 C) Oral 80 18 100 %    Radiology: Dg Hand Complete  Right  01/14/2015   CLINICAL DATA:  Evaluate for infection of the right hand.  EXAM: RIGHT HAND - COMPLETE 3+ VIEW  COMPARISON:  And radiograph 10/29/2010  FINDINGS: Normal anatomic alignment. No evidence for acute fracture or dislocation. No significant soft tissue swelling. No definite destructive osseous lesion identified.  IMPRESSION: No acute osseous abnormality.   Electronically Signed   By: Lovey Newcomer M.D.   On: 01/14/2015 18:54   Dg Finger Index Right  01/07/2015   CLINICAL DATA:  Swelling at PIP joint RIGHT index finger, had drainage and blood after sticking site with a needle, question osteomyelitis  EXAM: RIGHT INDEX FINGER 2+V  COMPARISON:  RIGHT hand radiographs 10/29/2010  FINDINGS: Soft tissue swelling centered at PIP joint RIGHT index finger, especially dorsally.  Osseous mineralization normal.  Joint spaces preserved.  No acute fracture,  dislocation or bone destruction.  No soft tissue gas.  IMPRESSION: Soft tissue swelling without acute bony abnormalities.   Electronically Signed   By: Lavonia [place] M.D.   On: 01/07/2015 12:27   Dg Foot Complete Right  01/14/2015   CLINICAL DATA:  Right foot pain and swelling for 1 month, infection  EXAM: RIGHT FOOT COMPLETE - 3+ VIEW  COMPARISON:  04/15/2013  FINDINGS: Marked hallux valgus deformity is noted. Swelling of the soft tissue is identified at the level of the fifth metatarsal-phalangeal joint. No radiopaque foreign body. No osseous destruction or fracture.  IMPRESSION: No osseous destruction or fracture. Mild soft tissue swelling is noted adjacent to the fifth metatarsophalangeal joint.   Electronically Signed   By: Conchita Paris M.D.   On: 01/14/2015 18:53    Labs:  Recent Labs  01/14/15 1637 01/15/15 0437  WBC 2.6* 2.6*  RBC 4.12 3.77*  HCT 35.5* 32.8*  PLT 310 282    Recent Labs  01/14/15 1637 01/15/15 0437  NA 140 141  K 3.3* 4.3  CL 103 105  CO2 28 26  BUN 9 15  CREATININE 1.05 0.80  GLUCOSE 147* 95  CALCIUM 9.0 8.8   No results for input(s): LABPT, INR in the last 72 hours.  Review of Systems: Review of Systems  Constitutional: Negative for fever.  Musculoskeletal: Positive for joint pain.    Physical Exam: Right index finger PIP joint swollen and tender to palpation.  Limited joint ROM.  Right foot has dorsal and plantar ulcers as documented in [place] note.  Foot is markedly tender to palpation.  Good pedal pulses.    Assessment and Plan: Vasculitis.  Advised patient that nonoperative treatment is recommended at this time.  Understands that continued drug use could potentially cause her to need amputation if this problem does not get better.  She voices understanding.  Patient can follow up in our office with foot/ankle specialist Dr Meridee Score in a couple of weeks for recheck.  Reviewed treatment plan with my attending Dr [name].    Alyson Locket. Ricard [name]  PA-C For Rodell Perna MD AS ABOVE. Long Hx of crack smoking . She went through treatment in the past but states " when I got out I only started Smoking more".    I showed her pictures of patients who lost their nose, ears, etc. There is no treatment option with her continued drug use.    I discussed with her that drug rehab options are available but she is not interested.

## 2015-01-15 NOTE — Progress Notes (Signed)
INITIAL NUTRITION ASSESSMENT  DOCUMENTATION CODES Per approved criteria  -Severe  malnutrition in the context of social or environmental circumstances   Pt meets criteria for severe MALNUTRITION in the context of social and environmental circumstance as evidenced by severe fat and muscle depletion, 8.3% wt loss x 3 months.  INTERVENTION: Ensure Enlive po BID, each supplement provides 350 kcal and 20 grams of protein  NUTRITION DIAGNOSIS: Malnutrition related to cocaine abuse as evidenced by severe fat and muscle depletion, 8.3% wt loss x 3 months.   Goal: Pt will meet >90% of estimated nutritional needs  Monitor:  PO/supplement intake, labs, weight changes, I/O's  Reason for Assessment: MST=2  54 y.o. female  Admitting Dx: Vasculitis of skin  [name] is a 54yo woman with PMHx of vasculitis secondary to levimasole toxicity, cocaine abuse, HTN, and depression who presents with ulcers on her right foot, ear, nose, and right index finger. 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 She reports the ulcers on her foot started about 4-5 days ago and the ulcers on her ear and nose started only 2-3 days ago. She describes associated 10/10 pain that has not been relieved with Mobic. She states she went to an urgent care for her right index finger swelling about 1 week ago and was prescribed Keflex which she completed. She reports yellow pus is draining from her right foot ulcers and from her right index finger. She admits to cocaine use a few days ago because she was feeling depressed about her family. Of note, she was hospitalized at [facility] for 1 week in January for severe depression and associated marijuana/cocaine use. Patient also notes lesions on both of her legs that have been present for several months. She states they started off as red areas and have now "crusted" over. She notes associated pruritis with both the new and old lesions. She denies fever,  chills, nausea, and vomiting.   ASSESSMENT: Pt admitted with vasculitis of skin secondary to cocaine use.  Pt reports a poor appetite PTA, however, she did not elaborate further when probed. Pt was very drowsy at time of visit.  Noted pt was previously on a Heart Healthy diet. Noted 100% meal completion. Wt hx reveals a 8.3% wt loss over the past 3 months.  Medical resident in room during exam revealed that x-rays showed no sings of infection. They are awaiting wound care consult for further recommendations.  Labs reviewed. WDL.   Nutrition Focused Physical Exam:  Subcutaneous Fat:  Orbital Region: severe depletion Upper Arm Region: severe depletion Thoracic and Lumbar Region: severe depletion  Muscle:  Temple Region: severe depletion Clavicle Bone Region: severe depletion Clavicle and Acromion Bone Region: severe depletion Scapular Bone Region: severe depletion Dorsal Hand: severe depletion Patellar Region: severe depletion Anterior Thigh Region: severe depletion Posterior Calf Region: severe depletion  Edema: none present  Height: Ht Readings from Last 1 Encounters:  01/14/15 5' 1"  (1.549 m)    Weight: Wt Readings from Last 1 Encounters:  01/14/15 88 lb 6.5 oz (40.1 kg)    Ideal Body Weight: 105#  % Ideal Body Weight: 84%  Wt Readings from Last 20 Encounters:  01/14/15 88 lb 6.5 oz (40.1 kg)  01/14/15 91 lb 1.6 oz (41.323 kg)  10/09/14 94 lb (42.638 kg)  10/09/14 96 lb (43.545 kg)  09/24/14 99 lb 11.2 oz (45.224 kg)  05/07/14 123 lb (55.792 kg)  04/30/14 125 lb 4.8 oz (56.836 kg)  04/19/14 113 lb 1.5  oz (51.3 kg)  04/19/14 117 lb 9.6 oz (53.343 kg)  04/11/14 120 lb 11.2 oz (54.749 kg)  03/30/14 111 lb 8.8 oz (50.6 kg)  03/26/14 118 lb 14.4 oz (53.933 kg)  02/17/14 110 lb (49.896 kg)  01/13/14 91 lb (41.277 kg)  12/14/13 100 lb (45.36 kg)  10/17/13 100 lb (45.36 kg)  09/27/13 101 lb 6.6 oz (46 kg)  08/31/13 95 lb (43.092 kg)  06/23/13 94 lb (42.638 kg)   06/19/13 92 lb 8 oz (41.958 kg)   Usual Body Weight: 100#  % Usual Body Weight: 88%  BMI:  Body mass index is 16.71 kg/(m^2). Underweight  Estimated Nutritional Needs: Kcal: 1400-1600 Protein: 60-70 grams Fluid: 1.4-1.6 L  Skin: necrotic woundson rt posterior ear and lt ear lobe, open wound on anterior rt foot, open wound ion rt posterior foot, open incision on tip of left nose  Diet Order:  NPO  EDUCATION NEEDS: -Education not appropriate at this time   Intake/Output Summary (Last 24 hours) at 01/15/15 0942 Last data filed at 01/14/15 1903  Gross per 24 hour  Intake    600 ml  Output      0 ml  Net    600 ml    Last BM: PTA  Labs:   Recent Labs Lab 01/14/15 1637 01/15/15 0437  NA 140 141  K 3.3* 4.3  CL 103 105  CO2 28 26  BUN 9 15  CREATININE 1.05 0.80  CALCIUM 9.0 8.8  MG  --  1.9  PHOS  --  3.8  GLUCOSE 147* 95    CBG (last 3)  No results for input(s): GLUCAP in the last 72 hours.  Scheduled Meds: . aspirin  325 mg Oral Daily  . buPROPion  150 mg Oral Daily  . cefTRIAXone (ROCEPHIN)  IV  1 g Intravenous Q24H  . heparin subcutaneous  5,000 Units Subcutaneous 3 times per day  . traZODone  50 mg Oral QHS,MR X 1    Continuous Infusions:   Past Medical History  Diagnosis Date  . 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 Followed by Dr. Louanne Skye  . Hypertension   . Cocaine abuse     ongoing with resultant vaculitis.  . Depression   . Normocytic anemia     BL Hgb 9.8-12. Last anemia panel 04/2010 - showing Fe 19, ferritin 101.  Pt on monthly B12 injections  . 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 CAP (community acquired pneumonia) 03/2014 X 2  . Daily headache   . Migraines     "probably 5-6/yr" (04/21/2014)  . Rheumatoid arthritis(714.0)     patient reported  . Inflammatory arthritis     Past Surgical History  Procedure Laterality Date  . Skin biopsy Bilateral 04/2010    shin nodules  . Irrigation and  debridement abscess Bilateral 09/26/2013    Procedure: DEBRIDEMENT ULCERS BILATERAL THIGHS;  Surgeon: Gwenyth Ober, MD;  Location: [place];  Service: General;  Laterality: Bilateral;  . Hernia repair      "stomach"    Acquanetta Cabanilla A. Jimmye Norman, RD, LDN, CDE Pager: [phone] After hours Pager: [phone]

## 2015-01-16 MED ORDER — DOXYCYCLINE HYCLATE 100 MG PO TABS: 100.0000 mg | ORAL_TABLET | Freq: Two times a day (BID) | ORAL | Status: DC

## 2015-01-16 MED ORDER — DOXYCYCLINE HYCLATE 100 MG PO TABS
100.0000 mg | ORAL_TABLET | Freq: Two times a day (BID) | ORAL | Status: DC
  Administered 2015-01-16: 100 mg via ORAL
  Filled 2015-01-16: qty 1

## 2015-01-16 NOTE — Progress Notes (Signed)
Reviewed AVS discharge instructions with patient. Patient was already given doxcycline to take at home. Patient wanted to know if she could get a prescription for  pain medication. Notified Dr. Sherrine Maples about patient's concern, Dr.Mallory stated that patient  was not getting a prescription for pain medication. This was told to patient. Patient was also given a bus ticket so she could go home. Patient stated that she could ambulate to the bus stop to go home, so she ambulated on own to bus stop.

## 2015-01-16 NOTE — Discharge Instructions (Signed)
Start taking Doxycycline at 9:30 PM tonight. Continue taking until you finish all pills.   Stop using cocaine. This is the best treatment for your vasculitis.   Take Meloxicam daily for pain.   We will call you tomorrow with a clinic appointment time. Your appointment will be for sometime next week.

## 2015-01-16 NOTE — Progress Notes (Signed)
Notified Internal Medicine resident at pager # [phone] about pt's home medication and that per pt she was taken mobic and keflex at home. Resident stated that pt can continue taking mobic and stop taken keflex and take doxcycline;  He also stated that patient can discontinue the other medications listed on AVS discharge instructions. This information was told to patient.

## 2015-01-16 NOTE — Progress Notes (Signed)
Subjective: Patient reports she is feeling better today. She notes her finger is not draining pus anymore. She still complains of foot pain.   Objective: Vital signs in last 24 hours: Filed Vitals:   01/15/15 0552 01/15/15 1429 01/15/15 2115 01/16/15 0513  BP: 145/69 165/73 101/52 104/46  Pulse: 77 80 86 72  Temp: 98.6 F (37 C) 99 F (37.2 C) 98.5 F (36.9 C) 98.5 F (36.9 C)  TempSrc: Oral Oral Oral Oral  Resp: 19 18 16 16   Height:      Weight:      SpO2: 100% 100% 100% 99%   Weight change:   Intake/Output Summary (Last 24 hours) at 01/16/15 1412 Last data filed at 01/15/15 2330  Gross per 24 hour  Intake    120 ml  Output      0 ml  Net    120 ml   Physical Exam General: sitting up in bed, NAD HEENT: [place]/AT, EOMI. Small darkened area at tip of nose and a perforated nasal septum. Right and left ears have chronic sores associated with vasculitis that are painful to palpation. CV: RRR, no m/g/r Pulm: CTA bilaterally, breaths non-labored Abd: BS+, soft, non-tender Ext: Swollen right index finger with no erythema or drainage. Ulcer present on dorsum aspect of right foot which appears superficial. Another ulcer is present on the 2nd digit at plantar aspect of right foot with sanguinous drainage present Neuro: alert and oriented x 3, no focal deficits   Lab Results: Basic Metabolic Panel:  Recent Labs Lab 01/14/15 1637 01/15/15 0437  NA 140 141  K 3.3* 4.3  CL 103 105  CO2 28 26  GLUCOSE 147* 95  BUN 9 15  CREATININE 1.05 0.80  CALCIUM 9.0 8.8  MG  --  1.9  PHOS  --  3.8   Liver Function Tests:  Recent Labs Lab 01/14/15 1637  AST 23  ALT 10  ALKPHOS 59  BILITOT 0.5  PROT 6.9  ALBUMIN 3.4*   CBC:  Recent Labs Lab 01/14/15 1637 01/15/15 0437  WBC 2.6* 2.6*  NEUTROABS 1.4*  --   HGB 12.0 11.5*  HCT 35.5* 32.8*  MCV 86.2 87.0  PLT 310 282   Studies/Results: Dg Hand Complete Right  01/14/2015   CLINICAL DATA:  Evaluate for infection of the  right hand.  EXAM: RIGHT HAND - COMPLETE 3+ VIEW  COMPARISON:  And radiograph 10/29/2010  FINDINGS: Normal anatomic alignment. No evidence for acute fracture or dislocation. No significant soft tissue swelling. No definite destructive osseous lesion identified.  IMPRESSION: No acute osseous abnormality.   Electronically Signed   By: Lovey Newcomer M.D.   On: 01/14/2015 18:54   Dg Foot Complete Right  01/14/2015   CLINICAL DATA:  Right foot pain and swelling for 1 month, infection  EXAM: RIGHT FOOT COMPLETE - 3+ VIEW  COMPARISON:  04/15/2013  FINDINGS: Marked hallux valgus deformity is noted. Swelling of the soft tissue is identified at the level of the fifth metatarsal-phalangeal joint. No radiopaque foreign body. No osseous destruction or fracture.  IMPRESSION: No osseous destruction or fracture. Mild soft tissue swelling is noted adjacent to the fifth metatarsophalangeal joint.   Electronically Signed   By: Conchita Paris M.D.   On: 01/14/2015 18:53   Medications: I have reviewed the patient's current medications.  Assessment/Plan:  Vasculitis 2/2 Levamisole Toxicity: Patient admitted to recent cocaine abuse that likely exacerbated her known vasculitis secondary to levamisole. X-rays of her right hand and foot  do not show evidence of osteomyelitis. Wound care evaluated patient and recommended for her to use silver gel which has worked well in the past. Ortho was consulted and state no surgical intervention necessary at this time. They agree ultimately the best treatment option for her vasculitis is for her to abstain from cocaine. Patient is interested in ALFs. Will consult SW and see if options available.  - Stop Ceftriaxone, transition to Doxycycline 100 mg BID for 5 days (end date 01/20/15) - Appreciate wound care's evaluation - Oxycodone IR 5 mg PO Q4H PRN - Continue silver gel  - SW consulted for possible ALF placement  HTN: Normotensive. She is not on any home antihypertensives. She has been on  Lisinopril in the past.  - Consider adding antihypertensive if SBP > 180  Depression: Patient's mood is currently stable. She was recently hospitalized at [facility] for major depression and substance abuse including marijuana and cocaine. Patient admits to recent cocaine use.  - Continue home Bupropion 150 mg daily  - Continue home Trazodone 50 mg QHS - Continue home Hydroxyzine 25 mg daily   Diet: Heart healthy  VTE Ppx: Heparin SQ Dispo: Disposition is deferred at this time, awaiting improvement of current medical problems.  Anticipated discharge in approximately 1-2 day(s).   The patient does have a current PCP Charlesetta Shanks, MD) and does need an [facility] hospital follow-up appointment after discharge.  The patient does not have transportation limitations that hinder transportation to clinic appointments.  .Services Needed at time of discharge: Y = Yes, Blank = No PT:   OT:   RN:   Equipment:   Other:     LOS: 2 days   Juliet Rude, MD 01/16/2015, 2:12 PM

## 2015-01-16 NOTE — Discharge Summary (Signed)
Name: [name] MRN: [id number] DOB: 1961-05-26 54 y.o. 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     buPROPion 150 MG 24 hr tablet  Commonly known as:  WELLBUTRIN XL  Take 1 tablet (150 mg total) by mouth daily.     doxycycline 100 MG tablet  Commonly known as:  VIBRA-TABS  Take 1 tablet (100 mg total) by mouth every 12 (twelve) hours.     hydrOXYzine 25 MG tablet  Commonly known as:  ATARAX/VISTARIL  Take 1 tablet (25 mg total) by mouth every 6 (six) hours as needed for itching or anxiety.     meloxicam 7.5 MG tablet  Commonly known as:  MOBIC  Take 1 tablet (7.5 mg total) by mouth daily.     oxyCODONE 5 MG immediate release tablet  Commonly known as:  ROXICODONE  Take 1 tablet (5 mg total) by mouth every 4 (four) hours as needed for severe pain.     traZODone 50 MG tablet  Commonly known as:  DESYREL  Take 1 tablet (50 mg total) by mouth at bedtime and may repeat dose one time if needed.        Disposition and follow-up:   [name] was discharged from [facility] in Good condition.  At the hospital follow up visit please address:  1.  Vasculitis- Do sores appear better? Did patient complete doxycycline? Patient may require short course of steroids.   2.  Labs / imaging needed at time of follow-up: None   3.  Pending labs/ test needing follow-up: None   Follow-up Appointments:     Follow-up Information    Follow up with Albin Felling, MD.   Specialty:  Internal Medicine   Why:   Appointment on April 27th at [place] information:   [place] [address] [phone]       Discharge Instructions:   Consultations: Treatment Team:  Marybelle Killings, MD  Procedures Performed:  Dg Hand Complete Right  01/14/2015   CLINICAL DATA:  Evaluate for infection of the right hand.  EXAM: RIGHT HAND - COMPLETE 3+ VIEW  COMPARISON:  And radiograph 10/29/2010  FINDINGS: Normal anatomic alignment. No evidence for acute fracture or dislocation. No significant soft tissue swelling. No definite destructive osseous lesion identified.  IMPRESSION: No acute osseous abnormality.   Electronically Signed   By: Lovey Newcomer M.D.   On: 01/14/2015 18:54   Dg Finger Index Right  01/07/2015   CLINICAL DATA:  Swelling at PIP joint RIGHT index finger, had drainage and blood after sticking site with a needle, question osteomyelitis  EXAM: RIGHT INDEX FINGER 2+V  COMPARISON:  RIGHT hand radiographs 10/29/2010  FINDINGS: Soft tissue swelling centered at PIP joint RIGHT index finger, especially dorsally.  Osseous mineralization normal.  Joint spaces preserved.  No acute fracture, dislocation or bone destruction.  No soft tissue gas.  IMPRESSION: Soft tissue swelling without acute bony abnormalities.  Electronically Signed   By: Lavonia Dana M.D.   On: 01/07/2015 12:27   Dg Foot Complete Right  01/14/2015   CLINICAL DATA:  Right foot pain and swelling for 1 month, infection  EXAM: RIGHT FOOT COMPLETE - 3+ VIEW  COMPARISON:  04/15/2013  FINDINGS: Marked hallux valgus deformity is noted. Swelling of the soft tissue is identified at the level of the fifth metatarsal-phalangeal joint. No radiopaque foreign body. No osseous destruction or fracture.  IMPRESSION: No osseous destruction or fracture. Mild soft tissue swelling is noted adjacent to the fifth metatarsophalangeal joint.   Electronically Signed   By: Conchita Paris M.D.   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 She reports the ulcers on her foot started about 4-5 days ago and the ulcers on her ear and nose started only 2-3 days ago. She describes associated 10/10 pain that has not been relieved with Mobic. She states she went to an urgent care for her right index finger swelling about 1 week ago and was prescribed Keflex which she completed. She reports yellow pus is draining from her right foot ulcers and from her right index finger. She admits to cocaine use a few days ago because she was feeling depressed about her family. Of note, she was hospitalized at [facility] for 1 week in January for severe depression and associated marijuana/cocaine use. Patient also notes lesions on both of her legs that have been present for several months. She states they started off as red areas and have now "crusted" over. She notes associated pruritis with both the new and old lesions. She denies fever, chills, nausea, and vomiting.   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 She was cocaine positive on her UDS and admitted to recent use. She has a long-standing history of levamisole-induced vasculitis and presented with another exacerbation. She was started on IV Ceftriaxone. Wound care and orthopedic surgery were consulted and recommended conservative treatment with silver gel on her wounds and abstaining from further cocaine use. Patient completed 2 days of Ceftriaxone and then was switched to Doxycycline 100 mg BID for 5 days (end date 01/20/15). She did not receive steroids as her finger and foot wounds had active purulence and was thought that steroids  would inhibit the healing process. Her wounds were no longer purulent upon discharge. Patient was continuously encouraged to stop using cocaine. She has a follow up appointment in the [facility] to reassess her wounds and determine if she will need a course of steroids.   HTN: Patient remained normotensive. She did not require any anti-hypertensives.   Depression: Patient's mood remained stable. She was continued on her home Bupropion 150 mg daily, Trazodone 50 mg QHS, and Hydroxyzine 25 mg daily.   Discharge Vitals:   BP 102/52 mmHg  Pulse 83  Temp(Src) 98 F (36.7 C) (Oral)  Resp 16  Ht 5' 1"  (1.549 m)  Wt 88 lb 6.5 oz (40.1 kg)  BMI 16.71 kg/m2  SpO2 99% Physical Exam General: alert, sitting up in bed, NAD HEENT: [place]/AT, EOMI. Small darkened area at tip of nose and a perforated nasal  septum. Right and left ears have chronic sores associated with vasculitis that are painful to palpation. CV: RRR, no m/g/r Pulm: CTA bilaterally, breaths non-labored Abd: BS+, soft, non-tender, non-distended  Ext: Swollen right index finger with no erythema or drainage. Ulcer present on dorsum aspect of right foot which appears superficial. Another ulcer is present on the 2nd digit at plantar aspect of right foot with sanguinous drainage present Neuro: alert and oriented x 3, no focal deficits   Signed: Juliet Rude, MD 01/16/2015, 5:24 PM    Services Ordered on Discharge: None Equipment Ordered on Discharge: None

## 2015-01-17 ENCOUNTER — Telehealth: Payer: Self-pay | Admitting: *Deleted

## 2015-01-17 NOTE — Telephone Encounter (Signed)
Per the discharge summary, she is supposed to be taking meloxicam for her pain.  If she thinks she needs more than that, she will need to be seen in clinic.

## 2015-01-17 NOTE — Telephone Encounter (Signed)
Pt states when she was discharged the doctor at [facility] told her to call clinic today and get her a script of "oxy's", do you want to write her a script, she states she gets "oxy 5's" , this works best for her pain. Please advise

## 2015-01-21 NOTE — Telephone Encounter (Signed)
Unable to leave message or speak to pt

## 2015-01-21 NOTE — Telephone Encounter (Signed)
Pt called back and she was informed she would need to be seen for evaluation r/t pain meds. appt is 4/20 dr Arcelia Jew

## 2015-01-23 ENCOUNTER — Encounter: Payer: Self-pay | Admitting: Internal Medicine

## 2015-01-23 ENCOUNTER — Ambulatory Visit (INDEPENDENT_AMBULATORY_CARE_PROVIDER_SITE_OTHER): Payer: Medicaid Other | Admitting: Internal Medicine

## 2015-01-23 VITALS — BP 135/80 | HR 81 | Temp 97.9°F | Ht 61.5 in | Wt 93.4 lb

## 2015-01-23 DIAGNOSIS — I999 Unspecified disorder of circulatory system: Secondary | ICD-10-CM

## 2015-01-23 DIAGNOSIS — F14988 Cocaine use, unspecified with other cocaine-induced disorder: Secondary | ICD-10-CM

## 2015-01-23 MED ORDER — NAPROXEN 500 MG PO TBEC: 500.0000 mg | DELAYED_RELEASE_TABLET | Freq: Two times a day (BID) | ORAL | Status: DC

## 2015-01-23 NOTE — Progress Notes (Signed)
   Subjective:    [name], female    DOB: 1961/02/05, 54 y.o.   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 Patient is well known to me as I took care of her during her most recent hospital stay. She was hospitalized from 4/11-4/13 for exacerbation of her levamisole-induced vasculitis due to recent cocaine use. She had purulent sores on her right index finger and on the dorsum of her right foot as well as another sore on the plantar aspect of her 2nd right toe. She was started on Ceftriaxone for 2 days and then switched to PO Doxycycline 100 mg BID to be completed for a total of 5 days. She had not received steroids as she had an active infection and was thought that steroids would inhibit healing.   Today, patient reports her sores are healing well. She does note a new sore in between her right 2nd and 3rd toes. She denies any purulent drainage. She reports she has been taking the Doxycycline, but has one pill left. She states she has been taking the Doxycycline as prescribed, twice daily. She also reports using the silver gel that was recommended by the wound care team in the hospital. She admits that she has used cocaine once since discharge from the hospital. She reports a lot of pain associated with her sores and is asking for oxycodone.    Review of Systems General: Denies fever, chills, night sweats, changes in weight, changes in appetite HEENT: Denies headaches, changes in vision, rhinorrhea, sore throat CV: Denies CP, palpitations, SOB, orthopnea Pulm: Denies SOB, cough, wheezing GI: Denies abdominal pain, nausea, vomiting, diarrhea, constipation, melena, hematochezia GU: Denies dysuria, hematuria, frequency Msk: Denies muscle cramps, joint pains Neuro: Denies weakness, numbness, tingling Skin: See above     Objective:   Physical Exam General: sitting up in chair, tearful at times HEENT: [place]/AT. EOMI. Chronic appearing sore on tip of nose. Right and left ears have chronic appearing sores that are scabbed over associated with vasculitis. Mucus membranes moist.  CV: RRR, no m/g/r Pulm: CTA bilaterally, breaths non-labored Abd: BS+, soft, non-tender, non-distended  Ext: Right index finger appears much improved and healing well. No purulent drainage or signs of infection. Ulcer on the dorsum of her right foot appears well healed and was covered by a foam bandage. There is a new sore in between her 2nd and 3rd toes of her right foot that has some serosanguinous drainage. The older sore on the plantar 2nd left toe appears to have healed well.  Neuro: alert and oriented x 3, no focal deficits     Assessment & Plan:  Refer to A&P documentation.

## 2015-01-23 NOTE — Assessment & Plan Note (Signed)
Overall, her sores from her previous hospitalization appear much better and healing well. She does have a new sore in between her right 2nd and 3rd toes that does not appear infected. She has almost completed the Doxycycline, although taking incorrectly. She did admit to recent cocaine use and I do not believe opioids are appropriate for her level of pain. We extensively discussed that stopping cocaine use is her best treatment. She understands that cocaine is causing her sores and that continual use could lead to amputation if her sores become infected and lead to bone infection.  - Complete doxycycline (1 pill left) - Continue to use silver gel - Prescribed Naproxen 500 mg twice daily - Encouraged cocaine cessation and offered rehab options, but patient not interested at this time

## 2015-01-23 NOTE — Progress Notes (Signed)
Internal Medicine Clinic Attending  Case discussed with Dr. Rivet at the time of the visit.  We reviewed the resident's history and exam and pertinent patient test results.  I agree with the assessment, diagnosis, and plan of care documented in the resident's note.  

## 2015-01-23 NOTE — Patient Instructions (Signed)
It was a pleasure seeing you today, [name].  - Try Naproxen 500 mg twice a day for pain - Please avoid using cocaine as this is what is causing your sores - Keep sore on your right foot clean by covering with a bandage and washing with soap and water daily - Continue using the silver gel as needed   General Instructions:   Please bring your medicines with you each time you come to clinic.  Medicines may include prescription medications, over-the-counter medications, herbal remedies, eye drops, vitamins, or other pills.   Progress Toward Treatment Goals:  Treatment Goal 09/24/2014  Blood pressure deteriorated  Stop smoking smoking the same amount    Self Care Goals & Plans:  Self Care Goal 01/23/2015  Manage my medications take my medicines as prescribed; bring my medications to every visit  Monitor my health -  Eat healthy foods eat foods that are low in salt; eat more vegetables  Be physically active find an activity I enjoy  Stop smoking set a quit date and stop smoking; cut down the number of cigarettes smoked  Meeting treatment goals -    No flowsheet data found.   Care Management & Community Referrals:  Referral 06/19/2013  Referrals made for care management support none needed  Referrals made to community resources none

## 2015-01-30 ENCOUNTER — Encounter: Payer: Self-pay | Admitting: Internal Medicine

## 2015-01-30 ENCOUNTER — Ambulatory Visit (INDEPENDENT_AMBULATORY_CARE_PROVIDER_SITE_OTHER): Payer: Medicaid Other | Admitting: Internal Medicine

## 2015-01-30 VITALS — BP 104/56 | HR 83 | Temp 98.1°F | Wt 92.5 lb

## 2015-01-30 DIAGNOSIS — F331 Major depressive disorder, recurrent, moderate: Secondary | ICD-10-CM | POA: Diagnosis not present

## 2015-01-30 DIAGNOSIS — I999 Unspecified disorder of circulatory system: Secondary | ICD-10-CM

## 2015-01-30 DIAGNOSIS — F14988 Cocaine use, unspecified with other cocaine-induced disorder: Secondary | ICD-10-CM | POA: Diagnosis not present

## 2015-01-30 NOTE — Assessment & Plan Note (Signed)
No new wounds present. Her most recent ones are healing nicely. I instructed her to complete the prednisone taper she was prescribed and continue naproxen as needed for pain. Again emphasized the need to stop using cocaine as this is the cause for her wounds.

## 2015-01-30 NOTE — Progress Notes (Signed)
   Subjective:    [name], female    DOB: April 03, 1961, 54 y.o.   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 Patient reports her wounds are healing nicely since I saw her one week ago. She has been using the silver cream.  She reports she went to the ED a few days ago for pain due to her vasculitis wounds. She was given a prescription for a prednisone taper, bupropion, and trazodone. She reports she has been taking the prednisone taper and has a few days left. Patient states the naproxen has been helping her pain as well. She admits to using cocaine again 3-4 days ago. She states she has not been taking the bupropion because she does not know why she was prescribed this medication.    Review of Systems General: Denies fever, chills, night sweats, changes in weight, changes in appetite HEENT: Denies headaches, ear pain, changes in vision, rhinorrhea, sore throat CV: Denies CP, palpitations, SOB, orthopnea Pulm: Denies SOB, cough, wheezing GI: Denies abdominal pain, nausea, vomiting, diarrhea, constipation, melena, hematochezia GU: Denies dysuria, hematuria, frequency Msk: Denies muscle cramps, joint pains Neuro: Denies weakness, numbness, tingling Skin: Denies rashes, bruising    Objective:   Physical Exam General: resting comfortably on exam table, tearful at times  HEENT: [place]/AT, EOMI. Her chronic nose and ear sores appear much improved compared to last visit. Minimal scabbing present.  CV: RRR, no m/g/r Pulm: CTA bilaterally, breaths non-labored Abd: BS+, soft, non-tender Ext: Right index finger improved and healing well. Ulcers on both of her feet are scabbed over and well healed. No drainage present.  Neuro: alert and oriented x 3, no focal deficits     Assessment & Plan:  Please refer to A&P documentation.

## 2015-01-30 NOTE — Assessment & Plan Note (Addendum)
Explained to patient that her bupropion medication was prescribed for her depression. I recommended for her to start taking this medication given her long history of depression and that it would help her start feeling better. She agreed to start taking this medication.  - Start Bupropion 150 mg daily

## 2015-01-30 NOTE — Patient Instructions (Addendum)
It was a pleasure seeing you, [name].   - Continue taking Naproxen as needed for pain - Continue taking Prednisone taper  - Continue taking Bupropion for depression - Please try to avoid using cocaine as this makes your sores worse.   General Instructions:   Please bring your medicines with you each time you come to clinic.  Medicines may include prescription medications, over-the-counter medications, herbal remedies, eye drops, vitamins, or other pills.   Progress Toward Treatment Goals:  Treatment Goal 09/24/2014  Blood pressure deteriorated  Stop smoking smoking the same amount    Self Care Goals & Plans:  Self Care Goal 01/23/2015  Manage my medications take my medicines as prescribed; bring my medications to every visit  Monitor my health -  Eat healthy foods eat foods that are low in salt; eat more vegetables  Be physically active find an activity I enjoy  Stop smoking set a quit date and stop smoking; cut down the number of cigarettes smoked  Meeting treatment goals -    No flowsheet data found.   Care Management & Community Referrals:  Referral 06/19/2013  Referrals made for care management support none needed  Referrals made to community resources none

## 2015-01-31 NOTE — Progress Notes (Signed)
INTERNAL MEDICINE TEACHING ATTENDING ADDENDUM - Cristina Subramaniam, MD: I reviewed and discussed at the time of visit with the resident Dr. Rivet, the patient's medical history, physical examination, diagnosis and results of pertinent tests and treatment and I agree with the patient's care as documented.  

## 2015-02-01 ENCOUNTER — Encounter: Payer: Self-pay | Admitting: *Deleted

## 2015-02-02 IMAGING — CR DG CHEST 2V
2 series · 2 of 2 positions shown · non-contrast
Comparison: 02/13/2012

CLINICAL DATA: Cough.  Pain and swelling of left ear.

CHEST - 2 VIEW

[w chest pa]
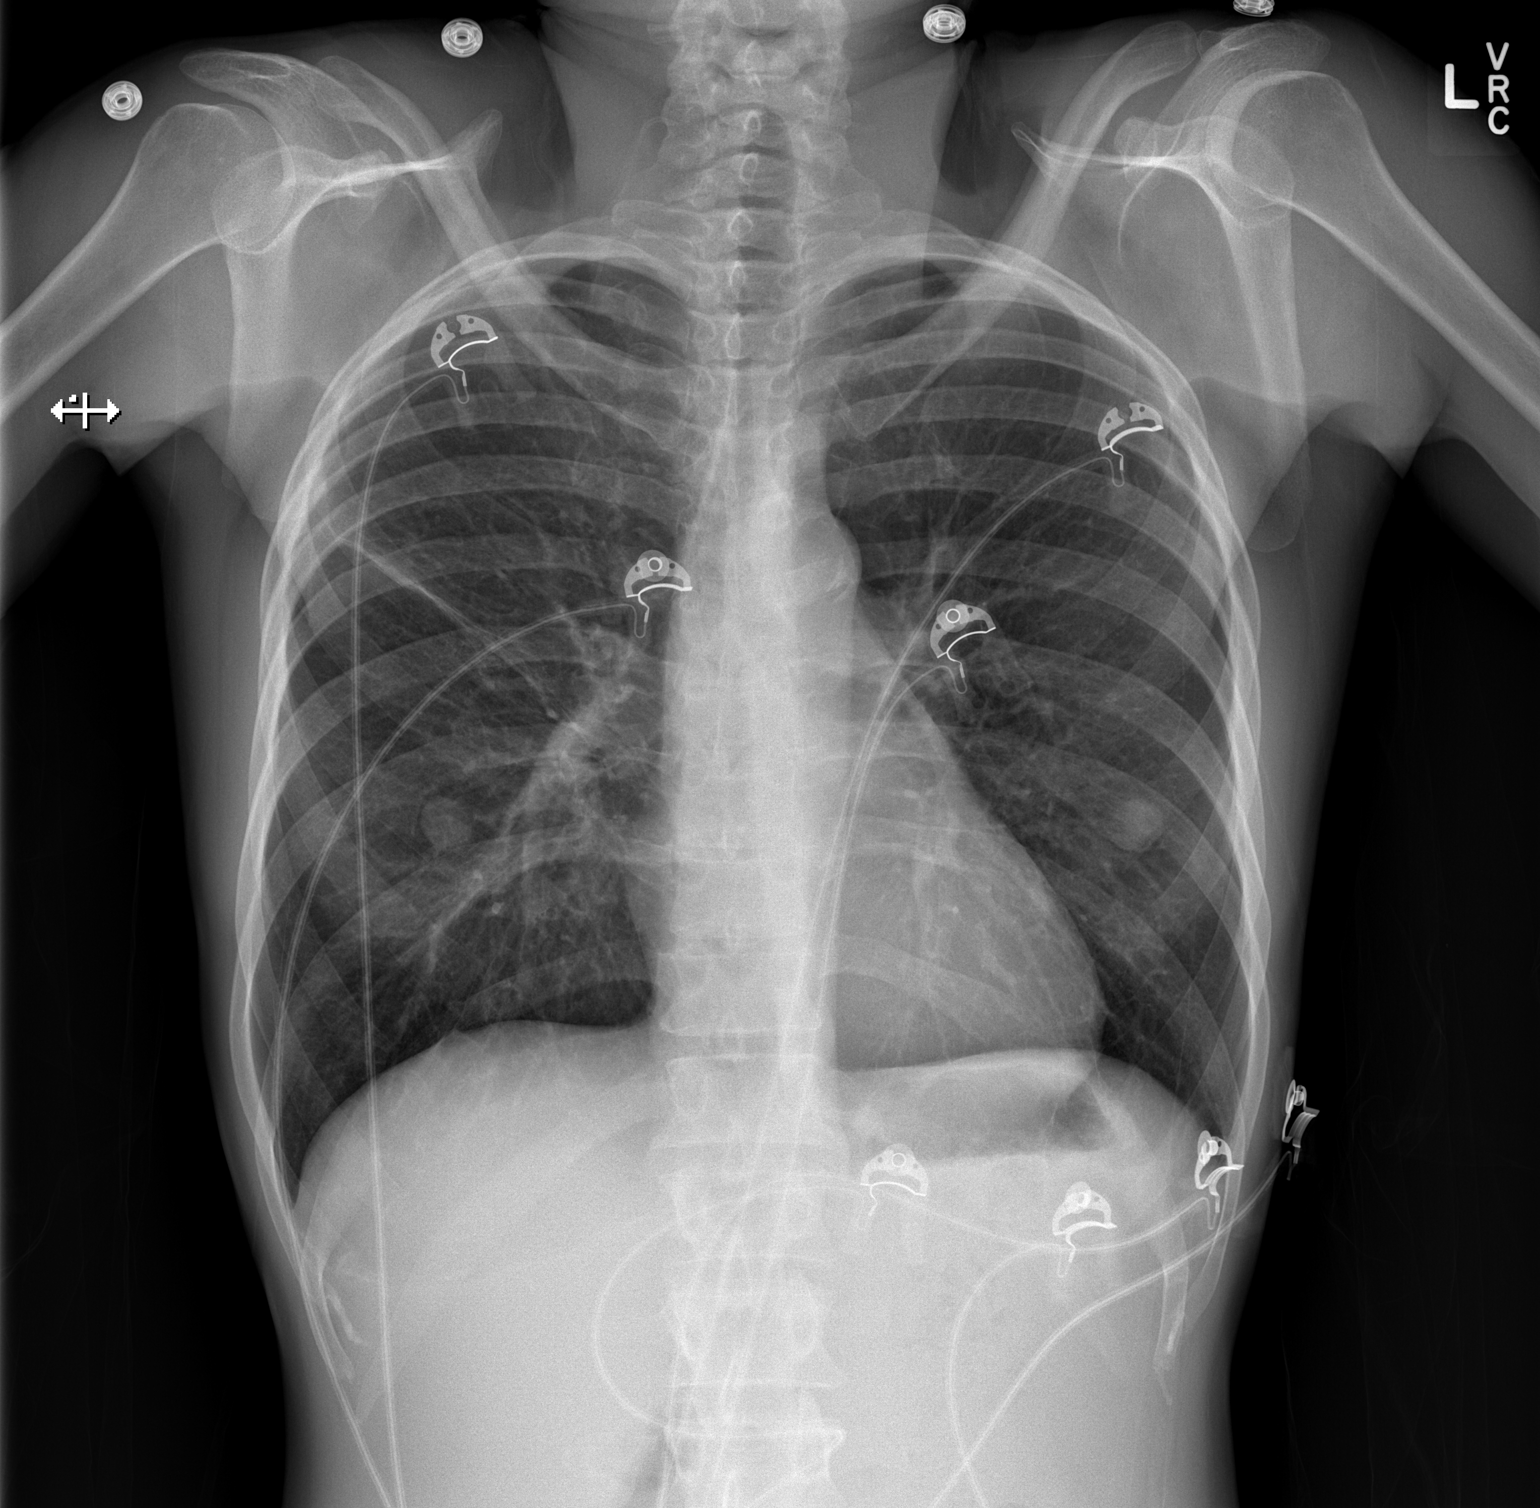

[w chest lat]
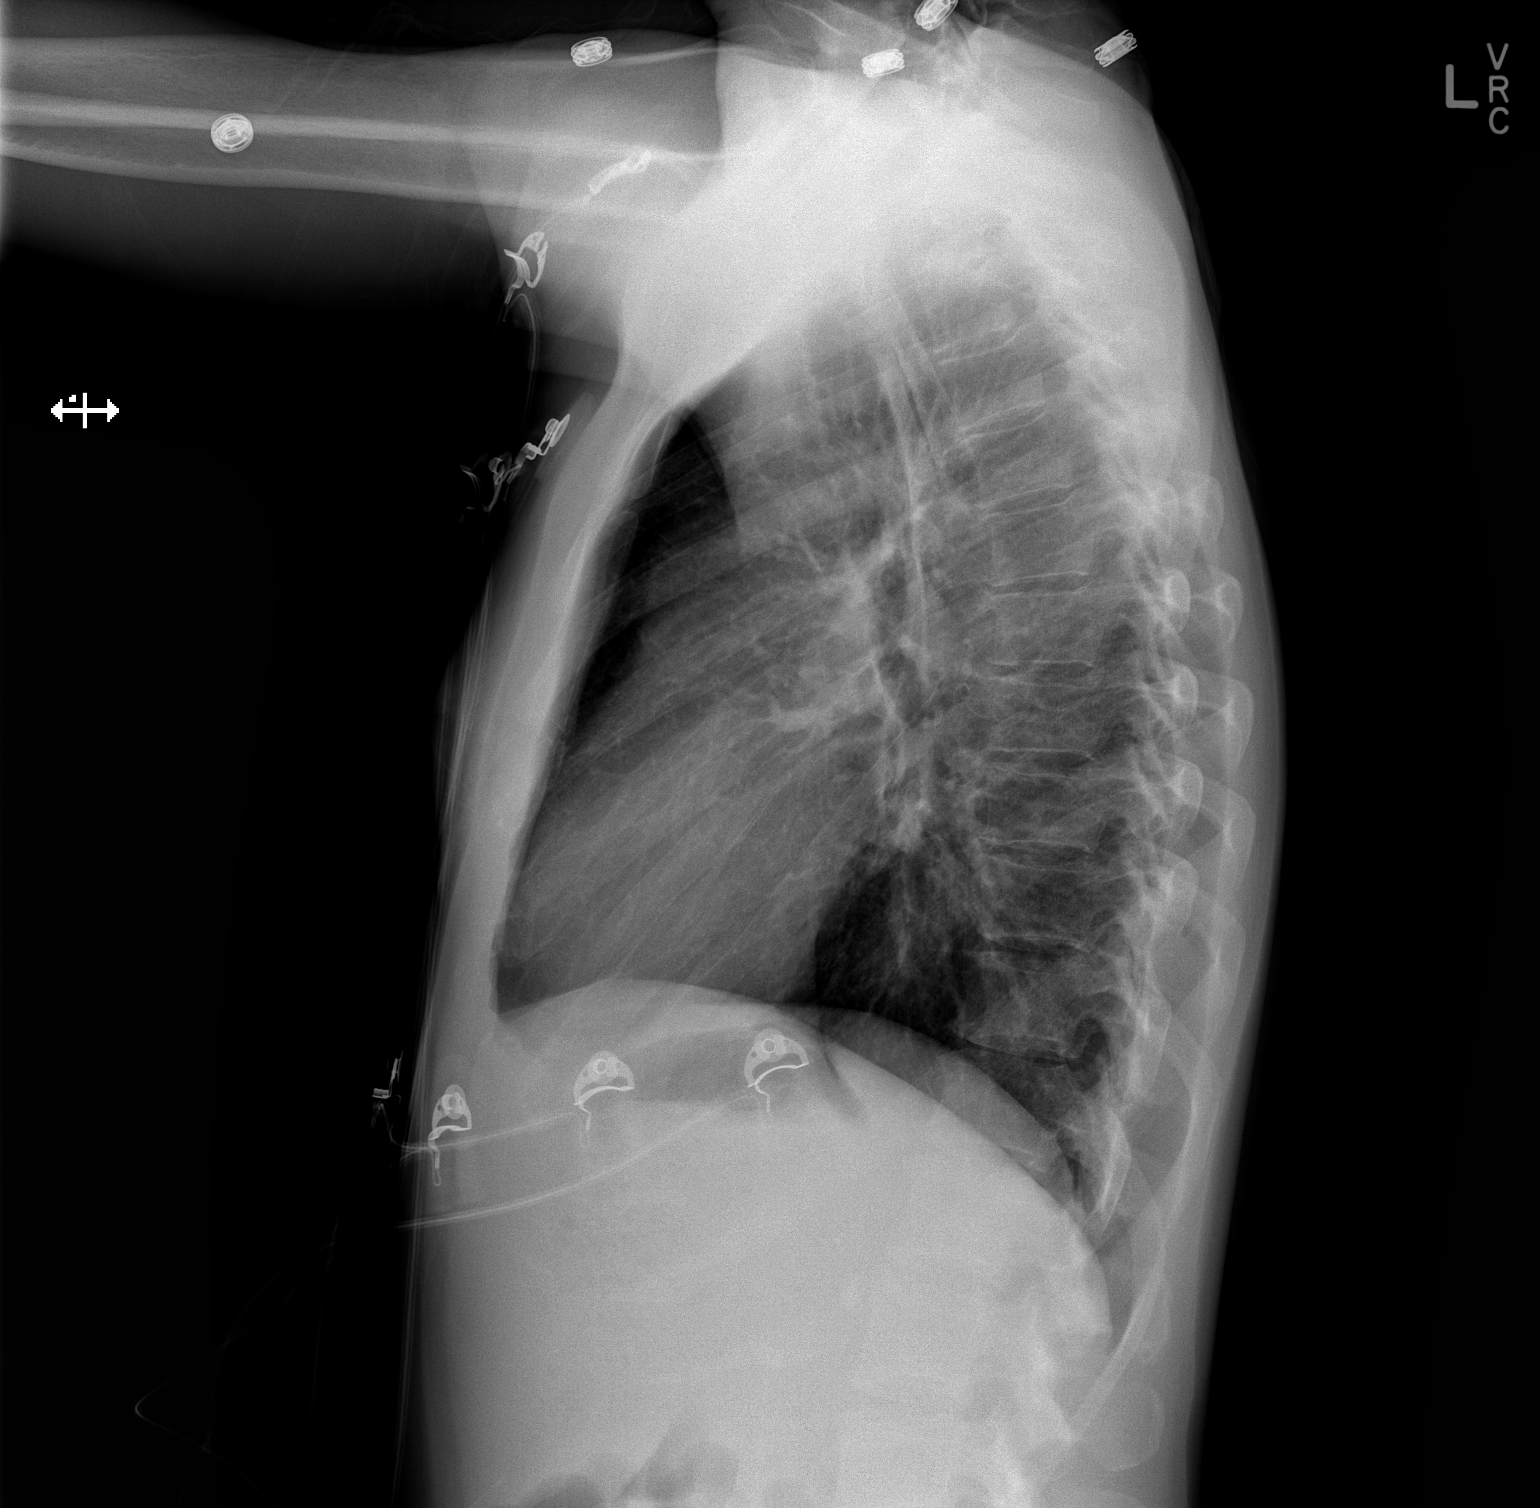

[2 of 2 positions shown; findings below may reference images not displayed]

FINDINGS: Midline trachea.  Normal heart size.  Aortic
atherosclerosis which is markedly age advanced. No pleural effusion
or pneumothorax.  Linear scarring or atelectasis radiates from the
right hilum.  Prominent nipple shadows, present back to 10/05/2010.
Diffuse peribronchial thickening.
IMPRESSION: No acute cardiopulmonary disease.

Peribronchial thickening which may relate to chronic bronchitis or
smoking.

Developing scar/atelectasis within the right perihilar region.

## 2015-02-11 ENCOUNTER — Other Ambulatory Visit: Payer: Self-pay | Admitting: *Deleted

## 2015-02-11 NOTE — Telephone Encounter (Signed)
I am not sure who has been prescribing her prednisone.  If she is having another vasculitis flare, I would like her to be evaluated in clinic prior to writing another prescription for prednisone.

## 2015-02-12 NOTE — Telephone Encounter (Signed)
Have left 2 messages for pt

## 2015-02-13 NOTE — Telephone Encounter (Signed)
Have left 3 rd message

## 2015-02-21 ENCOUNTER — Encounter: Payer: Self-pay | Admitting: Internal Medicine

## 2015-02-21 ENCOUNTER — Ambulatory Visit (INDEPENDENT_AMBULATORY_CARE_PROVIDER_SITE_OTHER): Payer: Medicaid Other | Admitting: Internal Medicine

## 2015-02-21 VITALS — BP 118/59 | HR 81 | Temp 97.5°F | Ht 61.5 in | Wt 98.0 lb

## 2015-02-21 DIAGNOSIS — F331 Major depressive disorder, recurrent, moderate: Secondary | ICD-10-CM

## 2015-02-21 DIAGNOSIS — F14188 Cocaine abuse with other cocaine-induced disorder: Secondary | ICD-10-CM | POA: Diagnosis not present

## 2015-02-21 DIAGNOSIS — L959 Vasculitis limited to the skin, unspecified: Secondary | ICD-10-CM

## 2015-02-21 DIAGNOSIS — F14988 Cocaine use, unspecified with other cocaine-induced disorder: Secondary | ICD-10-CM

## 2015-02-21 DIAGNOSIS — F339 Major depressive disorder, recurrent, unspecified: Secondary | ICD-10-CM | POA: Diagnosis not present

## 2015-02-21 DIAGNOSIS — I999 Unspecified disorder of circulatory system: Principal | ICD-10-CM

## 2015-02-21 MED ORDER — PREDNISONE 2.5 MG PO TABS: 10.0000 mg | ORAL_TABLET | Freq: Every day | ORAL | Status: DC

## 2015-02-21 NOTE — Assessment & Plan Note (Addendum)
Patient resumed bupropion treatment for her depression earlier this month. She has been compliant since then. She continues to have depressed mood, worst when she thinks about her estranged family. She believes this sadness contributes to her desire to use cocaine. She was tearful on exam today. Also takes Trazodone for sleep.  - Continue Bupropion 150 mg daily - Continue Trazodone 50 mg PRN for sleep - Continue to follow for response to Bupropion

## 2015-02-21 NOTE — Assessment & Plan Note (Signed)
Several new blistering wounds present; they are all circular and approximately 2 mm in diameter. There is one on right dorsal 3rd digit (draining), one on lateral side of right thumb, several on each lower extremity. Patient is requesting prednisone, which helps her through acute episodes of vasculitis. Interestingly, although steroids have been used for treatment of this syndrome, it is unclear if they provide any benefit [facility], 2013, [place]). However, given that the prednisone helps her symptomatically and that she has new wounds, will provide a short course. Naproxen has been helpful to the patient when her wounds cause her pain; she is not currently experiencing tenderness or pain.   - 3 day course of 10 mg prednisone - Continue silver gel application to active wounds - Continue naproxen for pain relief as needed - Importance of stopping cocaine use was re-emphasized and patient confirmed understanding; states that she is not interested in rehab at this time

## 2015-02-21 NOTE — Patient Instructions (Signed)
Take four of the prednisone pills each morning with breakfast for the next three days.  Continue to apply silver gel to the wounds.  Continue to use naproxen for any pain.  STOP using cocaine in order to prevent new wounds.  General Instructions:   Please bring your medicines with you each time you come to clinic.  Medicines may include prescription medications, over-the-counter medications, herbal remedies, eye drops, vitamins, or other pills.   Progress Toward Treatment Goals:  Treatment Goal 09/24/2014  Blood pressure deteriorated  Stop smoking smoking the same amount    Self Care Goals & Plans:  Self Care Goal 02/21/2015  Manage my medications take my medicines as prescribed; bring my medications to every visit; refill my medications on time  Monitor my health -  Eat healthy foods eat more vegetables; eat foods that are low in salt; eat baked foods instead of fried foods  Be physically active find an activity I enjoy  Stop smoking set a quit date and stop smoking  Meeting treatment goals -    No flowsheet data found.   Care Management & Community Referrals:  Referral 06/19/2013  Referrals made for care management support none needed  Referrals made to community resources none

## 2015-02-21 NOTE — Progress Notes (Signed)
Subjective:   [name]   DOB: 10/02/1961 54 y.o.   MRN: [id number]  HPI: [name] is a 54 y.o. 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 She is exposed to levamisole as a cocaine contaminant. She was hospitalized 4/11-4/13 for exacerbation of her vasculitis. She was treated with ceftriaxone in the hospital, then transitioned to doxycycline. She was then given a taper of prednisone on her hospital follow-up appointment along with silver gel to apply to her wounds and naproxen for the pain.  Since her last clinic appointment (4/27), her prior wounds and pain have been improving; however, she has developed several new wounds. These are on her nose, dorsal right 3rd digit and the lateral side of her right thumb. She denies any other symptoms. She last used cocaine three days ago. She reports that she knows she needs to stop using cocaine, but has never been successful for more than 2 months. She has been to rehab multiple times. She has no interest in rehab now. She has successfully cut back on the frequency of her cocaine use. She also uses marijuana, but denies alcohol use or other drug use.  She has started to take her bupropion for depression. She believes her sadness over her estranged family drives her to use cocaine.   She is requesting a bus pass today.   Past Medical History  Diagnosis Date  . 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 Followed by Dr. Louanne Skye  . Hypertension   . Cocaine abuse     ongoing with resultant vaculitis.  . Depression   . Normocytic anemia     BL Hgb 9.8-12. Last anemia panel 04/2010 - showing Fe 19, ferritin 101.  Pt on monthly B12 injections  . 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 CAP (community acquired pneumonia) 03/2014 X 2  . Daily headache   . Migraines     "probably  5-6/yr" (04/21/2014)  . Rheumatoid arthritis(714.0)     patient reported  . Inflammatory arthritis    Current Outpatient Prescriptions  Medication Sig Dispense Refill  . aspirin 325 MG tablet Take 1 tablet (325 mg total) by mouth daily. (Patient not taking: Reported on 12/15/2014)    . buPROPion (WELLBUTRIN XL) 150 MG 24 hr tablet Take 150 mg by mouth daily.  0  . naproxen (EC NAPROSYN) 500 MG EC tablet Take 1 tablet (500 mg total) by mouth 2 (two) times daily with a meal. 60 tablet 2  . predniSONE (DELTASONE) 20 MG tablet   0  . traZODone (DESYREL) 50 MG tablet Take 1 tablet (50 mg total) by mouth at bedtime and may repeat dose one time if needed. 30 tablet 0   No current facility-administered medications for this visit.   Family History  Problem Relation Age of Onset  . Breast cancer Mother     Breast cancer  . Alcohol abuse Mother   . Colon cancer Maternal Aunt 16  . Alcohol abuse Father    History   Social History  . Marital Status: Single    Spouse Name: N/A  . Number of Children: 0  . Years of Education: 11th grade   Occupational History  . Disability     since 2011, due to her rheumatoid arthritis   Social History Main Topics  .  Smoking status: Current Every Day Smoker -- 0.30 packs/day for 34 years    Types: Cigarettes  . Smokeless tobacco: Never Used     Comment: PATIENT HAS CUT BACK TO 5 CIGARETES A DAY  . Alcohol Use: No  . Drug Use: Yes    Special: "Crack" cocaine     Comment: States she still uses crack "a little bit".  . Sexual Activity: Not on file   Other Topics Concern  . None   Social History Narrative   Unemployed:  cleaning in past   Living at [place]; has Medicaid.   crack/cocaine use; pt denies IVDU   tobacco:  1/2 ppd, trying to quit   alcohol:  none       Review of Systems: General: no recent illness, last used cocaine 3 days ago Skin: extensive scarring and wounds from prior episodes of vasculitis secondary to levamisole  toxicity HEENT: nose lesion, no headache or blurred vision Cardiac: no chest pain or palpitations Respiratory: no shortness of breath or wheezing GI: no changes Urinary: no changes Vascular: history of vasculitis Msk: no joint pain, muscle pain or swelling Psychiatric: history of depression, current depressed mood  Objective:  Physical Exam: Filed Vitals:   02/21/15 1321  BP: 118/59  Pulse: 81  Temp: 97.5 F (36.4 C)  TempSrc: Oral  Height: 5' 1.5" (1.562 m)  Weight: 98 lb (44.453 kg)  SpO2: 100%   Appearance: in NAD, but tearful upon discussion of her social situation HEENT: [place], PERRL, EOMi, no lymphadenopathy, hyperpigmentation on tip of nose, nontender, extensive healed ear sores b/l Heart: RRR, normal S1S2, no murmur Lungs: CTAB, no wheezing, normal effort Abdomen: BS+, soft, non-tender Extremities: right index and 4th finger healing well, 2 mm diameter blistering lesions on right dorsal 3rd finger (draining) and on lateral side of right thumb, several new blistering lesions on lower extremities of a similar size, all nontender, foot lesions healing very well Neurologic: A&Ox3, grossly intact  Assessment & Plan:   Please see problem oriented charting for assessment & plan  Venita Lick, MD

## 2015-02-22 NOTE — Progress Notes (Signed)
Internal Medicine Clinic Attending  Case discussed with Dr. Mallory at the time of the visit.  We reviewed the resident's history and exam and pertinent patient test results.  I agree with the assessment, diagnosis, and plan of care documented in the resident's note. 

## 2015-03-04 ENCOUNTER — Emergency Department (HOSPITAL_COMMUNITY)
Admission: EM | Admit: 2015-03-04 | Discharge: 2015-03-04 | Disposition: A | Discharge status: Home / Self Care | Payer: Medicaid Other | Attending: Emergency Medicine | Admitting: Emergency Medicine

## 2015-03-04 ENCOUNTER — Encounter (HOSPITAL_COMMUNITY): Payer: Self-pay

## 2015-03-04 DIAGNOSIS — Y9289 Other specified places as the place of occurrence of the external cause: Secondary | ICD-10-CM | POA: Insufficient documentation

## 2015-03-04 DIAGNOSIS — S6991XA Unspecified injury of right wrist, hand and finger(s), initial encounter: Secondary | ICD-10-CM | POA: Diagnosis present

## 2015-03-04 DIAGNOSIS — Z8701 Personal history of pneumonia (recurrent): Secondary | ICD-10-CM | POA: Diagnosis not present

## 2015-03-04 DIAGNOSIS — S01302A Unspecified open wound of left ear, initial encounter: Secondary | ICD-10-CM | POA: Diagnosis not present

## 2015-03-04 DIAGNOSIS — S0120XA Unspecified open wound of nose, initial encounter: Secondary | ICD-10-CM | POA: Insufficient documentation

## 2015-03-04 DIAGNOSIS — I10 Essential (primary) hypertension: Secondary | ICD-10-CM | POA: Diagnosis not present

## 2015-03-04 DIAGNOSIS — M069 Rheumatoid arthritis, unspecified: Secondary | ICD-10-CM | POA: Diagnosis not present

## 2015-03-04 DIAGNOSIS — Z791 Long term (current) use of non-steroidal anti-inflammatories (NSAID): Secondary | ICD-10-CM | POA: Diagnosis not present

## 2015-03-04 DIAGNOSIS — X58XXXA Exposure to other specified factors, initial encounter: Secondary | ICD-10-CM | POA: Diagnosis not present

## 2015-03-04 DIAGNOSIS — F1410 Cocaine abuse, uncomplicated: Secondary | ICD-10-CM | POA: Diagnosis not present

## 2015-03-04 DIAGNOSIS — Y9389 Activity, other specified: Secondary | ICD-10-CM | POA: Diagnosis not present

## 2015-03-04 DIAGNOSIS — Z720 Tobacco use: Secondary | ICD-10-CM | POA: Insufficient documentation

## 2015-03-04 DIAGNOSIS — S01301A Unspecified open wound of right ear, initial encounter: Secondary | ICD-10-CM | POA: Diagnosis not present

## 2015-03-04 DIAGNOSIS — T07XXXA Unspecified multiple injuries, initial encounter: Secondary | ICD-10-CM

## 2015-03-04 DIAGNOSIS — Z8679 Personal history of other diseases of the circulatory system: Secondary | ICD-10-CM | POA: Diagnosis not present

## 2015-03-04 DIAGNOSIS — S61401A Unspecified open wound of right hand, initial encounter: Secondary | ICD-10-CM | POA: Insufficient documentation

## 2015-03-04 DIAGNOSIS — Z79899 Other long term (current) drug therapy: Secondary | ICD-10-CM | POA: Diagnosis not present

## 2015-03-04 DIAGNOSIS — Y998 Other external cause status: Secondary | ICD-10-CM | POA: Diagnosis not present

## 2015-03-04 DIAGNOSIS — Z862 Personal history of diseases of the blood and blood-forming organs and certain disorders involving the immune mechanism: Secondary | ICD-10-CM | POA: Diagnosis not present

## 2015-03-04 DIAGNOSIS — Z8659 Personal history of other mental and behavioral disorders: Secondary | ICD-10-CM | POA: Diagnosis not present

## 2015-03-04 MED ORDER — PREDNISONE 20 MG PO TABS
ORAL_TABLET | ORAL | Status: DC
Start: 2015-03-04 — End: 2015-03-04

## 2015-03-04 MED ORDER — KETOROLAC TROMETHAMINE 30 MG/ML IJ SOLN
30.0000 mg | Freq: Once | INTRAMUSCULAR | Status: AC
  Administered 2015-03-04: 30 mg via INTRAMUSCULAR
  Filled 2015-03-04: qty 1

## 2015-03-04 MED ORDER — OXYCODONE HCL 5 MG PO TABS
5.0000 mg | ORAL_TABLET | Freq: Once | ORAL | Status: AC
  Administered 2015-03-04: 5 mg via ORAL
  Filled 2015-03-04: qty 1

## 2015-03-04 MED ORDER — PREDNISONE 20 MG PO TABS: ORAL_TABLET | ORAL | Status: DC

## 2015-03-04 MED ORDER — SILVER SULFADIAZINE 1 % EX CREA
TOPICAL_CREAM | Freq: Once | CUTANEOUS | Status: AC
  Administered 2015-03-04: 17:00:00 via TOPICAL
  Filled 2015-03-04: qty 50

## 2015-03-04 MED ORDER — HYDROGEN PEROXIDE 3 % EX SOLN
CUTANEOUS | Status: AC
  Filled 2015-03-04: qty 473

## 2015-03-04 NOTE — ED Notes (Addendum)
Per EMS, Pt, from home, c/o bilateral ear pain, L foot  Pain, and R index and middle finger pain increasing x 3 days.  Pain score 10/10.  Wounds noted to all areas.  Pt has a history of levamisole toxicity from mixing it w/ cocaine.  Pt reports last use x 4 days ago.  Pt reports taking all medications as prescribed w/o relief.

## 2015-03-04 NOTE — ED Notes (Signed)
Bed: [facility] Expected date:  Expected time:  Means of arrival:  Comments: 

## 2015-03-04 NOTE — ED Provider Notes (Signed)
CSN: [id number]     Arrival date & time 03/04/15  1445 History   First MD Initiated Contact with Patient 03/04/15 1552     Chief Complaint  Patient presents with  . Otalgia  . Foot Pain  . Hand Pain     (Consider location/radiation/quality/duration/timing/severity/associated sxs/prior Treatment) HPI Comments: Patients with history of chronic wounds and vasculitis due to levamisole toxicity from cocaine presents with complaint of pain in her right hand, left toes, ears, nose. Patient states that she has had new wounds appear on the dorsum of her right middle finger and lateral R thumb. She states that these have been there for approximately 3 days. Review of records show the patient had these at her previous PCP appointment on 02/21/2015. Patient admits to continued cocaine use, most recently 4 days ago. She has been taking all medications which include naproxen at home without relief. She states that she does not have any Silvadene ointment which she was previously given for wound care. Patient states that when she comes to the hospital she usually receives Dilaudid or morphine. She has been on anti-biotics and prednisone taper in the past. No documented fevers. No nausea, vomiting, or abdominal pain. Patient is tearful and crying on exam.  Patient is a 54 y.o. female presenting with ear pain, lower extremity pain, and hand pain. The history is provided by the patient and medical records.  Otalgia Associated symptoms: no abdominal pain, no cough, no diarrhea, no fever, no headaches, no rash, no rhinorrhea, no sore throat and no vomiting   Foot Pain Pertinent negatives include no abdominal pain, chest pain, coughing, fever, headaches, myalgias, nausea, rash, sore throat or vomiting.  Hand Pain Pertinent negatives include no abdominal pain, chest pain, coughing, fever, headaches, myalgias, nausea, rash, sore throat or vomiting.    Past Medical History  Diagnosis Date  . Vasculitis     2/2  Levimasole toxicity. Followed by Dr. Louanne Skye  . Hypertension   . Cocaine abuse     ongoing with resultant vaculitis.  . Depression   . Normocytic anemia     BL Hgb 9.8-12. Last anemia panel 04/2010 - showing Fe 19, ferritin 101.  Pt on monthly B12 injections  . 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 CAP (community acquired pneumonia) 03/2014 X 2  . Daily headache   . Migraines     "probably 5-6/yr" (04/21/2014)  . Rheumatoid arthritis(714.0)     patient reported  . Inflammatory arthritis    Past Surgical History  Procedure Laterality Date  . Skin biopsy Bilateral 04/2010    shin nodules  . Irrigation and debridement abscess Bilateral 09/26/2013    Procedure: DEBRIDEMENT ULCERS BILATERAL THIGHS;  Surgeon: Gwenyth Ober, MD;  Location: [facility];  Service: General;  Laterality: Bilateral;  . Hernia repair      "stomach"   Family History  Problem Relation Age of Onset  . Breast cancer Mother     Breast cancer  . Alcohol abuse Mother   . Colon cancer Maternal Aunt 58  . Alcohol abuse Father    History  Substance Use Topics  . Smoking status: Current Every Day Smoker -- 0.30 packs/day for 34 years    Types: Cigarettes  . Smokeless tobacco: Never Used     Comment: PATIENT HAS CUT BACK TO 5 CIGARETES A DAY  . Alcohol Use: No   OB History    No data available  Review of Systems  Constitutional: Negative for fever.  HENT: Positive for ear pain. Negative for rhinorrhea and sore throat.   Eyes: Negative for redness.  Respiratory: Negative for cough.   Cardiovascular: Negative for chest pain.  Gastrointestinal: Negative for nausea, vomiting, abdominal pain and diarrhea.  Genitourinary: Negative for dysuria.  Musculoskeletal: Negative for myalgias.  Skin: Positive for color change and wound. Negative for rash.  Neurological: Negative for headaches.      Allergies  Acetaminophen  Home Medications   Prior to Admission medications    Medication Sig Start Date End Date Taking? Authorizing Provider  naproxen (EC NAPROSYN) 500 MG EC tablet Take 1 tablet (500 mg total) by mouth 2 (two) times daily with a meal. 01/23/15  Yes Carly Montey Hora, MD  traZODone (DESYREL) 50 MG tablet Take 1 tablet (50 mg total) by mouth at bedtime and may repeat dose one time if needed. 10/17/14  Yes Kerrie Buffalo, NP  aspirin 325 MG tablet Take 1 tablet (325 mg total) by mouth daily. Patient not taking: Reported on 12/15/2014 10/17/14   Kerrie Buffalo, NP  predniSONE (DELTASONE) 2.5 MG tablet Take 4 tablets (10 mg total) by mouth daily with breakfast. Patient not taking: Reported on 03/04/2015 02/21/15   Karlene Einstein, MD   BP 159/100 mmHg  Pulse 111  Temp(Src) 98.1 F (36.7 C) (Oral)  Resp 18  SpO2 99%   Physical Exam  Constitutional: She appears well-developed and well-nourished. She appears distressed.  Patient crying excessively on exam.  HENT:  Head: Normocephalic and atraumatic.  Mouth/Throat: Oropharynx is clear and moist.  Patient with chronic wounds to her bilateral ears and tip of nose. No significant associated cellulitis or purulence noted.  Eyes: Conjunctivae are normal.  Neck: Normal range of motion. Neck supple.  Pulmonary/Chest: No respiratory distress.  Neurological: She is alert.  Skin: Skin is warm and dry.  Patients with several areas of emaciation between the toes of her left foot. There is no surrounding cellulitis. There is no purulence observed. Patient has an open sore on the dorsal aspect of her right long digit. No associated cellulitis. There is a erythematous papule noted to the lateral aspect of her right thumb which is not open. Again no associated cellulitis.  Psychiatric: She has a normal mood and affect.  Nursing note and vitals reviewed.   ED Course  Procedures (including critical care time) Labs Review Labs Reviewed - No data to display  Imaging Review No results found.   EKG Interpretation None        4:05 PM Patient seen and examined. No obvious severe infection on exam. Medications ordered. Will have wounds cleaned and dressed. After review of previous PCP records, feel this is likely more of a pain control problem today rather than acute worsening of her chronic wounds. She does have some open wounds, none of which look obviously infected.  Vital signs reviewed and are as follows: BP 159/100 mmHg  Pulse 111  Temp(Src) 98.1 F (36.7 C) (Oral)  Resp 18  SpO2 99%  5:21 PM Wounds cleaned and dressed by nurse. Patient provided silvadene. One oxycodone 31m given ED as well as IM toradol.  Patient to be d/c to home. Will give tapered prednisone. Will not rx narcotics for home. Will have patient follow-up with PCP for management of her chronic problems. Pt encouraged to d/c cocaine.   MDM   Final diagnoses:  Cocaine abuse  Multiple open wounds   Pain from chronic wounds. None  appear overtly infected at this time. Patient appears well and non-toxic. Wounds treated in ED. Will d/c to home with PCP f/u.   No dangerous or life-threatening conditions suspected or identified by history, physical exam, and by work-up. No indications for hospitalization identified.     Carlisle Cater, PA-C 03/04/15 [place], MD 03/05/15 0000

## 2015-03-04 NOTE — Discharge Instructions (Signed)
Please read and follow all provided instructions.  Your diagnoses today include:  1. Cocaine abuse   2. Multiple open wounds    Tests performed today include:  Vital signs. See below for your results today.   Medications prescribed:   Prednisone - steroid medicine   It is best to take this medication in the morning to prevent sleeping problems. If you are diabetic, monitor your blood sugar closely and stop taking Prednisone if blood sugar is over 300. Take with food to prevent stomach upset.   Take any prescribed medications only as directed.   Home care instructions:  Follow any educational materials contained in this packet. Keep affected area above the level of your heart when possible. Wash area gently twice a day with warm soapy water. Do not apply alcohol or hydrogen peroxide. Cover the area if it draining or weeping.   Follow-up instructions: Please follow-up with your primary care provider in the next 3 days for further evaluation of your symptoms.   Return instructions:  Return to the Emergency Department if you have:  Fever  Worsening symptoms  Worsening pain  Worsening swelling  Redness of the skin that moves away from the affected area, especially if it streaks away from the affected area   Any other emergent concerns  Your vital signs today were: BP 159/100 mmHg   Pulse 111   Temp(Src) 98.1 F (36.7 C) (Oral)   Resp 18   SpO2 99% If your blood pressure (BP) was elevated above 135/85 this visit, please have this repeated by your doctor within one month. --------------

## 2015-03-04 NOTE — ED Notes (Signed)
Pt in hallway bed, no scanner available to scan meds.

## 2015-03-07 ENCOUNTER — Other Ambulatory Visit: Payer: Self-pay | Admitting: Internal Medicine

## 2015-03-07 ENCOUNTER — Emergency Department (HOSPITAL_COMMUNITY)
Admission: EM | Admit: 2015-03-07 | Discharge: 2015-03-07 | Disposition: A | Discharge status: Home / Self Care | Payer: Medicaid Other | Attending: Emergency Medicine | Admitting: Emergency Medicine

## 2015-03-07 ENCOUNTER — Emergency Department (HOSPITAL_COMMUNITY): Payer: Medicaid Other

## 2015-03-07 ENCOUNTER — Encounter (HOSPITAL_COMMUNITY): Payer: Self-pay | Admitting: *Deleted

## 2015-03-07 DIAGNOSIS — Z8701 Personal history of pneumonia (recurrent): Secondary | ICD-10-CM | POA: Insufficient documentation

## 2015-03-07 DIAGNOSIS — T07XXXA Unspecified multiple injuries, initial encounter: Secondary | ICD-10-CM

## 2015-03-07 DIAGNOSIS — I10 Essential (primary) hypertension: Secondary | ICD-10-CM | POA: Diagnosis not present

## 2015-03-07 DIAGNOSIS — L03116 Cellulitis of left lower limb: Secondary | ICD-10-CM | POA: Insufficient documentation

## 2015-03-07 DIAGNOSIS — F1410 Cocaine abuse, uncomplicated: Secondary | ICD-10-CM | POA: Insufficient documentation

## 2015-03-07 DIAGNOSIS — Z792 Long term (current) use of antibiotics: Secondary | ICD-10-CM | POA: Insufficient documentation

## 2015-03-07 DIAGNOSIS — F329 Major depressive disorder, single episode, unspecified: Secondary | ICD-10-CM | POA: Diagnosis not present

## 2015-03-07 DIAGNOSIS — M1990 Unspecified osteoarthritis, unspecified site: Secondary | ICD-10-CM | POA: Insufficient documentation

## 2015-03-07 DIAGNOSIS — Z720 Tobacco use: Secondary | ICD-10-CM | POA: Insufficient documentation

## 2015-03-07 DIAGNOSIS — L988 Other specified disorders of the skin and subcutaneous tissue: Secondary | ICD-10-CM | POA: Diagnosis present

## 2015-03-07 DIAGNOSIS — Z862 Personal history of diseases of the blood and blood-forming organs and certain disorders involving the immune mechanism: Secondary | ICD-10-CM | POA: Diagnosis not present

## 2015-03-07 LAB — BASIC METABOLIC PANEL
Anion gap: 9 (ref 5–15)
BUN: 19 mg/dL (ref 6–20)
CALCIUM: 8.8 mg/dL — AB (ref 8.9–10.3)
CO2: 22 mmol/L (ref 22–32)
Chloride: 109 mmol/L (ref 101–111)
Creatinine, Ser: 1.02 mg/dL — ABNORMAL HIGH (ref 0.44–1.00)
Glucose, Bld: 97 mg/dL (ref 65–99)
POTASSIUM: 3.4 mmol/L — AB (ref 3.5–5.1)
SODIUM: 140 mmol/L (ref 135–145)

## 2015-03-07 LAB — CBC WITH DIFFERENTIAL/PLATELET
BASOS PCT: 0 % (ref 0–1)
Basophils Absolute: 0 10*3/uL (ref 0.0–0.1)
EOS ABS: 0 10*3/uL (ref 0.0–0.7)
Eosinophils Relative: 1 % (ref 0–5)
HCT: 28.5 % — ABNORMAL LOW (ref 36.0–46.0)
Hemoglobin: 10 g/dL — ABNORMAL LOW (ref 12.0–15.0)
LYMPHS ABS: 1 10*3/uL (ref 0.7–4.0)
LYMPHS PCT: 30 % (ref 12–46)
MCH: 30.9 pg (ref 26.0–34.0)
MCHC: 35.1 g/dL (ref 30.0–36.0)
MCV: 88 fL (ref 78.0–100.0)
MONO ABS: 0.1 10*3/uL (ref 0.1–1.0)
MONOS PCT: 4 % (ref 3–12)
NEUTROS PCT: 65 % (ref 43–77)
Neutro Abs: 2.2 10*3/uL (ref 1.7–7.7)
Platelets: 335 10*3/uL (ref 150–400)
RBC: 3.24 MIL/uL — AB (ref 3.87–5.11)
RDW: 15 % (ref 11.5–15.5)
WBC: 3.4 10*3/uL — ABNORMAL LOW (ref 4.0–10.5)

## 2015-03-07 LAB — C-REACTIVE PROTEIN: CRP: 3 mg/dL — ABNORMAL HIGH (ref <1.0)

## 2015-03-07 LAB — SEDIMENTATION RATE: Sed Rate: 48 mm/hr — ABNORMAL HIGH (ref 0–22)

## 2015-03-07 MED ORDER — VANCOMYCIN HCL IN DEXTROSE 1-5 GM/200ML-% IV SOLN
1000.0000 mg | Freq: Once | INTRAVENOUS | Status: AC
  Administered 2015-03-07: 1000 mg via INTRAVENOUS
  Filled 2015-03-07: qty 200

## 2015-03-07 MED ORDER — OXYCODONE HCL 5 MG PO TABS
5.0000 mg | ORAL_TABLET | Freq: Once | ORAL | Status: AC
  Administered 2015-03-07: 5 mg via ORAL
  Filled 2015-03-07: qty 1

## 2015-03-07 MED ORDER — SULFAMETHOXAZOLE-TRIMETHOPRIM 800-160 MG PO TABS: 1.0000 | ORAL_TABLET | Freq: Two times a day (BID) | ORAL | Status: DC

## 2015-03-07 MED ORDER — KETOROLAC TROMETHAMINE 30 MG/ML IJ SOLN
30.0000 mg | Freq: Once | INTRAMUSCULAR | Status: AC
  Administered 2015-03-07: 30 mg via INTRAVENOUS
  Filled 2015-03-07: qty 1

## 2015-03-07 MED ORDER — CEPHALEXIN 500 MG PO CAPS: 500.0000 mg | ORAL_CAPSULE | Freq: Three times a day (TID) | ORAL | Status: DC

## 2015-03-07 NOTE — ED Provider Notes (Signed)
CSN: [id number]     Arrival date & time 03/07/15  0123 History   First MD Initiated Contact with Patient 03/07/15 0309     Chief Complaint  Patient presents with  . Blister     (Consider location/radiation/quality/duration/timing/severity/associated sxs/prior Treatment) HPI Comments: Patient is a 54 year old female with a history of cocaine abuse and vasculitis secondary to [facility] toxicity who presents to the emergency department for further evaluation of wounds. Patient continues to complain of wounds and sores to her nose, ears, and bilateral extremities. Patient states that she has begun to notice redness to her left foot which is new. She has been trying to clean the area with peroxide and has been applying Silvadene without improvement in her symptoms. Patient reports significant pain. Naproxen has provided no relief for this. No associated fever, numbness, or weakness. Patient has not followed up with her primary care provider since 02/21/2015. She denies being on any antibiotic recently.  The history is provided by the patient. No language interpreter was used.    Past Medical History  Diagnosis Date  . 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 Followed by Dr. Louanne Skye  . Hypertension   . Cocaine abuse     ongoing with resultant vaculitis.  . Depression   . Normocytic anemia     BL Hgb 9.8-12. Last anemia panel 04/2010 - showing Fe 19, ferritin 101.  Pt on monthly B12 injections  . 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 CAP (community acquired pneumonia) 03/2014 X 2  . Daily headache   . Migraines     "probably 5-6/yr" (04/21/2014)  . Rheumatoid arthritis(714.0)     patient reported  . Inflammatory arthritis    Past Surgical History  Procedure Laterality Date  . Skin biopsy Bilateral 04/2010    shin nodules  . Irrigation and debridement abscess Bilateral 09/26/2013    Procedure: DEBRIDEMENT ULCERS BILATERAL THIGHS;  Surgeon: Gwenyth Ober,  MD;  Location: [facility];  Service: General;  Laterality: Bilateral;  . Hernia repair      "stomach"   Family History  Problem Relation Age of Onset  . Breast cancer Mother     Breast cancer  . Alcohol abuse Mother   . Colon cancer Maternal Aunt 13  . Alcohol abuse Father    History  Substance Use Topics  . Smoking status: Current Every Day Smoker -- 0.30 packs/day for 34 years    Types: Cigarettes  . Smokeless tobacco: Never Used     Comment: PATIENT HAS CUT BACK TO 5 CIGARETES A DAY  . Alcohol Use: No   OB History    No data available      Review of Systems  Constitutional: Negative for fever.  Musculoskeletal: Positive for myalgias.  Skin: Positive for color change and wound.  Neurological: Negative for syncope.    Allergies  Acetaminophen  Home Medications   Prior to Admission medications   Medication Sig Start Date End Date Taking? Authorizing Provider  cephALEXin (KEFLEX) 500 MG capsule Take 1 capsule (500 mg total) by mouth 3 (three) times daily. 03/07/15   Antonietta Breach, PA-C  naproxen (EC NAPROSYN) 500 MG EC tablet Take 1 tablet (500 mg total) by mouth 2 (two) times daily with a meal. Patient not taking: Reported on 03/07/2015 01/23/15   Sindy Guadeloupe Rivet, MD  predniSONE (DELTASONE) 20 MG tablet 3 Tabs PO Days 1-3, then 2 tabs PO Days 4-6, then 1  tab PO Day 7-9, then Half Tab PO Day 10-12 Patient not taking: Reported on 03/07/2015 03/04/15   Carlisle Cater, PA-C  sulfamethoxazole-trimethoprim (BACTRIM DS,SEPTRA DS) 800-160 MG per tablet Take 1 tablet by mouth 2 (two) times daily. 03/07/15 03/14/15  Antonietta Breach, PA-C  traZODone (DESYREL) 50 MG tablet Take 1 tablet (50 mg total) by mouth at bedtime and may repeat dose one time if needed. Patient not taking: Reported on 03/07/2015 10/17/14   Kerrie Buffalo, NP   BP 146/73 mmHg  Pulse 89  Temp(Src) 98.9 F (37.2 C) (Oral)  Resp 20  SpO2 100%   Physical Exam  Constitutional: She is oriented to person, place, and time. She appears  well-developed and well-nourished. No distress.  Nontoxic/nonseptic appearing  HENT:  Head: Normocephalic and atraumatic.  Chronic wounds of b/l ears and to tip of nose; skin blackened in color.  Eyes: Conjunctivae and EOM are normal. No scleral icterus.  Neck: Normal range of motion.  Cardiovascular: Normal rate, regular rhythm and intact distal pulses.   DP and PT pulses 2+ b/l  Pulmonary/Chest: Effort normal. No respiratory distress.  Musculoskeletal: Normal range of motion.       Left foot: There is tenderness and swelling. There is normal range of motion, no bony tenderness, no crepitus and no deformity.       Feet:  Neurological: She is alert and oriented to person, place, and time. She exhibits normal muscle tone. Coordination normal.  Sensation to light touch intact in bilateral lower extremities  Skin: Skin is warm and dry. No rash noted. She is not diaphoretic. No pallor.  Several areas of open sores on feet and lower legs as well as b/l hands. Open sore, again, noted to dorsal right 3rd digit. No evidence of secondary cellulitis to this hand.  There is a sore noted between the left 4th and 5th toes with associated erythema and purulence. Skin emaciation, again, noted. There is associated TTP to this area.  Psychiatric: She has a normal mood and affect. Her behavior is normal.  Nursing note and vitals reviewed.   ED Course  Procedures (including critical care time) Labs Review Labs Reviewed  CBC WITH DIFFERENTIAL/PLATELET - Abnormal; Notable for the following:    WBC 3.4 (*)    RBC 3.24 (*)    Hemoglobin 10.0 (*)    HCT 28.5 (*)    All other components within normal limits  BASIC METABOLIC PANEL - Abnormal; Notable for the following:    Potassium 3.4 (*)    Creatinine, Ser 1.02 (*)    Calcium 8.8 (*)    All other components within normal limits  SEDIMENTATION RATE - Abnormal; Notable for the following:    Sed Rate 48 (*)    All other components within normal limits    C-REACTIVE PROTEIN - Abnormal; Notable for the following:    CRP 3.0 (*)    All other components within normal limits    Imaging Review Dg Foot Complete Left  03/07/2015   CLINICAL DATA:  LEFT foot pain and wound.  History of vasculitis.  EXAM: LEFT FOOT - COMPLETE 3+ VIEW  COMPARISON:  LEFT foot radiograph January 30, 2010  FINDINGS: No acute fracture deformity. No dislocation. Moderate hallux valgus deformity appears mildly advanced from prior examination with mild first metatarsophalangeal osteoarthrosis. No destructive bony lesions. Debris projecting in the dorsum of the forefoot with soft tissue swelling. No subcutaneous gas.  IMPRESSION: Dorsal forefoot soft tissue swelling with debris, which could be external  to the patient, recommend direct inspection.  No acute fracture deformity or dislocation.   Electronically Signed   By: Elon Alas M.D.   On: 03/07/2015 03:57     EKG Interpretation None       Medications  vancomycin (VANCOCIN) IVPB 1000 mg/200 mL premix (0 mg Intravenous Stopped 03/07/15 0457)  ketorolac (TORADOL) 30 MG/ML injection 30 mg (30 mg Intravenous Given 03/07/15 0442)  oxyCODONE (Oxy IR/ROXICODONE) immediate release tablet 5 mg (5 mg Oral Given 03/07/15 0606)     MDM   Final diagnoses:  Cellulitis of left foot  Multiple open wounds  Cocaine abuse    54 year old female with known history of vasculitis due to levamisole toxicity presents to the Emergency Department with complaints of increased redness to one of her wound sites. Concerning for early cellulitis. Patient is afebrile and hemodynamically stable. Treatment initiated with IV vancomycin. Patient has no leukocytosis today. Sedimentation rate is elevated, but fairly consistent with baseline. X-ray shows no evidence of osteomyelitis or subcutaneous gas.  Wound care completed in emergency department. Patient will be discharged on course of Bactrim and Keflex. Have advised follow-up with the wound clinic as  well as her primary care provider. Return precautions discussed and provided. Patient agreeable to plan with no unaddressed concerns. Patient discharged in good condition.   Filed Vitals:   03/07/15 0400 03/07/15 0430 03/07/15 0500 03/07/15 0600  BP: 159/79 170/94 144/79 146/73  Pulse: 94 99 95 89  Temp:    98.9 F (37.2 C)  TempSrc:    Oral  Resp:      SpO2: 97% 100% 98% 100%      Antonietta Breach, PA-C 03/07/15 5041  Linton Flemings, MD 03/07/15 1756

## 2015-03-07 NOTE — Discharge Instructions (Signed)
Follow-up with your primary care doctor as well as the wound clinic as soon as you are able. Take Bactrim and Keflex as prescribed. Cellulitis Cellulitis is an infection of the skin and the tissue under the skin. The infected area is usually red and tender. This happens most often in the arms and lower legs. HOME CARE   Take your antibiotic medicine as told. Finish the medicine even if you start to feel better.  Keep the infected arm or leg raised (elevated).  Put a warm cloth on the area up to 4 times per day.  Only take medicines as told by your doctor.  Keep all doctor visits as told. GET HELP IF:  You see red streaks on the skin coming from the infected area.  Your red area gets bigger or turns a dark color.  Your bone or joint under the infected area is painful after the skin heals.  Your infection comes back in the same area or different area.  You have a puffy (swollen) bump in the infected area.  You have new symptoms.  You have a fever. GET HELP RIGHT AWAY IF:   You feel very sleepy.  You throw up (vomit) or have watery poop (diarrhea).  You feel sick and have muscle aches and pains. MAKE SURE YOU:   Understand these instructions.  Will watch your condition.  Will get help right away if you are not doing well or get worse. Document Released: 03/09/2008 Document Revised: 02/05/2014 Document Reviewed: 12/07/2011 [facility] Patient Information 2015 [place], [place]. This information is not intended to replace advice given to you by your health care provider. Make sure you discuss any questions you have with your health care provider.  Wound Care Wound care helps prevent pain and infection.  You may need a tetanus shot if:  You cannot remember when you had your last tetanus shot.  You have never had a tetanus shot.  The injury broke your skin. If you need a tetanus shot and you choose not to have one, you may get tetanus. Sickness from tetanus can be  serious. HOME CARE   Only take medicine as told by your doctor.  Clean the wound daily with mild soap and water.  Change any bandages (dressings) as told by your doctor.  Put medicated cream and a bandage on the wound as told by your doctor.  Change the bandage if it gets wet, dirty, or starts to smell.  Take showers. Do not take baths, swim, or do anything that puts your wound under water.  Rest and raise (elevate) the wound until the pain and puffiness (swelling) are better.  Keep all doctor visits as told. GET HELP RIGHT AWAY IF:   Yellowish-white fluid (pus) comes from the wound.  Medicine does not lessen your pain.  There is a red streak going away from the wound.  You have a fever. MAKE SURE YOU:   Understand these instructions.  Will watch your condition.  Will get help right away if you are not doing well or get worse. Document Released: 06/30/2008 Document Revised: 12/14/2011 Document Reviewed: 01/25/2011 [facility] Patient Information 2015 [place], [place]. This information is not intended to replace advice given to you by your health care provider. Make sure you discuss any questions you have with your health care provider.

## 2015-03-07 NOTE — ED Notes (Signed)
Pt c/o blisters to fingers, ears, nose x 4 days. States that this is a recurrence.

## 2015-03-07 NOTE — ED Notes (Signed)
Claiborne Billings, PA at the bedside.

## 2015-03-09 ENCOUNTER — Emergency Department (HOSPITAL_COMMUNITY): Payer: Medicaid Other

## 2015-03-09 ENCOUNTER — Observation Stay (HOSPITAL_COMMUNITY)
Admission: EM | Admit: 2015-03-09 | Discharge: 2015-03-11 | Disposition: A | Discharge status: Home / Self Care | Payer: Medicaid Other | Attending: Internal Medicine | Admitting: Internal Medicine

## 2015-03-09 ENCOUNTER — Observation Stay (HOSPITAL_COMMUNITY): Payer: Medicaid Other

## 2015-03-09 ENCOUNTER — Encounter (HOSPITAL_COMMUNITY): Payer: Self-pay | Admitting: *Deleted

## 2015-03-09 DIAGNOSIS — M069 Rheumatoid arthritis, unspecified: Secondary | ICD-10-CM | POA: Insufficient documentation

## 2015-03-09 DIAGNOSIS — Z803 Family history of malignant neoplasm of breast: Secondary | ICD-10-CM | POA: Insufficient documentation

## 2015-03-09 DIAGNOSIS — S91302A Unspecified open wound, left foot, initial encounter: Secondary | ICD-10-CM | POA: Insufficient documentation

## 2015-03-09 DIAGNOSIS — F1420 Cocaine dependence, uncomplicated: Secondary | ICD-10-CM | POA: Diagnosis present

## 2015-03-09 DIAGNOSIS — R45851 Suicidal ideations: Secondary | ICD-10-CM | POA: Diagnosis not present

## 2015-03-09 DIAGNOSIS — L739 Follicular disorder, unspecified: Secondary | ICD-10-CM | POA: Insufficient documentation

## 2015-03-09 DIAGNOSIS — I776 Arteritis, unspecified: Principal | ICD-10-CM | POA: Insufficient documentation

## 2015-03-09 DIAGNOSIS — F339 Major depressive disorder, recurrent, unspecified: Secondary | ICD-10-CM | POA: Diagnosis not present

## 2015-03-09 DIAGNOSIS — S90812D Abrasion, left foot, subsequent encounter: Secondary | ICD-10-CM

## 2015-03-09 DIAGNOSIS — B999 Unspecified infectious disease: Secondary | ICD-10-CM

## 2015-03-09 DIAGNOSIS — F14288 Cocaine dependence with other cocaine-induced disorder: Secondary | ICD-10-CM

## 2015-03-09 DIAGNOSIS — L03116 Cellulitis of left lower limb: Secondary | ICD-10-CM

## 2015-03-09 DIAGNOSIS — D649 Anemia, unspecified: Secondary | ICD-10-CM | POA: Diagnosis not present

## 2015-03-09 DIAGNOSIS — I10 Essential (primary) hypertension: Secondary | ICD-10-CM | POA: Diagnosis not present

## 2015-03-09 DIAGNOSIS — L958 Other vasculitis limited to the skin: Secondary | ICD-10-CM | POA: Diagnosis not present

## 2015-03-09 DIAGNOSIS — G43909 Migraine, unspecified, not intractable, without status migrainosus: Secondary | ICD-10-CM | POA: Diagnosis not present

## 2015-03-09 DIAGNOSIS — F14988 Cocaine use, unspecified with other cocaine-induced disorder: Secondary | ICD-10-CM | POA: Diagnosis present

## 2015-03-09 DIAGNOSIS — Z590 Homelessness: Secondary | ICD-10-CM | POA: Insufficient documentation

## 2015-03-09 DIAGNOSIS — Z886 Allergy status to analgesic agent status: Secondary | ICD-10-CM | POA: Insufficient documentation

## 2015-03-09 DIAGNOSIS — F329 Major depressive disorder, single episode, unspecified: Secondary | ICD-10-CM | POA: Diagnosis not present

## 2015-03-09 DIAGNOSIS — I999 Unspecified disorder of circulatory system: Secondary | ICD-10-CM

## 2015-03-09 DIAGNOSIS — F17210 Nicotine dependence, cigarettes, uncomplicated: Secondary | ICD-10-CM | POA: Insufficient documentation

## 2015-03-09 DIAGNOSIS — L089 Local infection of the skin and subcutaneous tissue, unspecified: Secondary | ICD-10-CM | POA: Insufficient documentation

## 2015-03-09 LAB — CBC WITH DIFFERENTIAL/PLATELET
Basophils Absolute: 0 10*3/uL (ref 0.0–0.1)
Basophils Relative: 0 % (ref 0–1)
Eosinophils Absolute: 0 10*3/uL (ref 0.0–0.7)
Eosinophils Relative: 1 % (ref 0–5)
HCT: 33.2 % — ABNORMAL LOW (ref 36.0–46.0)
HEMOGLOBIN: 11.1 g/dL — AB (ref 12.0–15.0)
Lymphocytes Relative: 28 % (ref 12–46)
Lymphs Abs: 1.1 10*3/uL (ref 0.7–4.0)
MCH: 29.8 pg (ref 26.0–34.0)
MCHC: 33.4 g/dL (ref 30.0–36.0)
MCV: 89.2 fL (ref 78.0–100.0)
Monocytes Absolute: 0.2 10*3/uL (ref 0.1–1.0)
Monocytes Relative: 4 % (ref 3–12)
NEUTROS ABS: 2.6 10*3/uL (ref 1.7–7.7)
NEUTROS PCT: 67 % (ref 43–77)
Platelets: 358 10*3/uL (ref 150–400)
RBC: 3.72 MIL/uL — ABNORMAL LOW (ref 3.87–5.11)
RDW: 14.5 % (ref 11.5–15.5)
WBC: 3.9 10*3/uL — AB (ref 4.0–10.5)

## 2015-03-09 LAB — BASIC METABOLIC PANEL
Anion gap: 5 (ref 5–15)
BUN: 17 mg/dL (ref 6–20)
CALCIUM: 8.7 mg/dL — AB (ref 8.9–10.3)
CHLORIDE: 107 mmol/L (ref 101–111)
CO2: 26 mmol/L (ref 22–32)
Creatinine, Ser: 0.97 mg/dL (ref 0.44–1.00)
GFR calc non Af Amer: >60 mL/min (ref >60)
Glucose, Bld: 77 mg/dL (ref 65–99)
POTASSIUM: 3.5 mmol/L (ref 3.5–5.1)
SODIUM: 138 mmol/L (ref 135–145)

## 2015-03-09 LAB — RAPID URINE DRUG SCREEN, HOSP PERFORMED
AMPHETAMINES: NOT DETECTED
BENZODIAZEPINES: NOT DETECTED
Barbiturates: NOT DETECTED
COCAINE: POSITIVE — AB
Opiates: NOT DETECTED
Tetrahydrocannabinol: POSITIVE — AB

## 2015-03-09 LAB — I-STAT CG4 LACTIC ACID, ED
LACTIC ACID, VENOUS: 0.93 mmol/L (ref 0.5–2.0)
Lactic Acid, Venous: 1.33 mmol/L (ref 0.5–2.0)

## 2015-03-09 LAB — SEDIMENTATION RATE: Sed Rate: 60 mm/hr — ABNORMAL HIGH (ref 0–22)

## 2015-03-09 MED ORDER — OXYCODONE HCL 5 MG PO TABS
5.0000 mg | ORAL_TABLET | Freq: Four times a day (QID) | ORAL | Status: DC | PRN
  Administered 2015-03-09 – 2015-03-10 (×3): 5 mg via ORAL
  Filled 2015-03-09 (×3): qty 1

## 2015-03-09 MED ORDER — PREDNISONE 20 MG PO TABS: 20.0000 mg | ORAL_TABLET | Freq: Every day | ORAL | Status: DC

## 2015-03-09 MED ORDER — ONDANSETRON HCL 4 MG/2ML IJ SOLN: 4.0000 mg | Freq: Three times a day (TID) | INTRAMUSCULAR | Status: DC | PRN

## 2015-03-09 MED ORDER — PREDNISONE 10 MG PO TABS
10.0000 mg | ORAL_TABLET | Freq: Every day | ORAL | Status: DC
  Administered 2015-03-10: 10 mg via ORAL
  Filled 2015-03-09: qty 1

## 2015-03-09 MED ORDER — TRAZODONE HCL 50 MG PO TABS
50.0000 mg | ORAL_TABLET | Freq: Every evening | ORAL | Status: DC | PRN
  Administered 2015-03-09 – 2015-03-10 (×3): 50 mg via ORAL
  Filled 2015-03-09 (×4): qty 1

## 2015-03-09 MED ORDER — ENOXAPARIN SODIUM 40 MG/0.4ML ~~LOC~~ SOLN
40.0000 mg | Freq: Every day | SUBCUTANEOUS | Status: DC
  Administered 2015-03-09 – 2015-03-10 (×2): 40 mg via SUBCUTANEOUS
  Filled 2015-03-09 (×2): qty 0.4

## 2015-03-09 MED ORDER — VANCOMYCIN HCL IN DEXTROSE 1-5 GM/200ML-% IV SOLN
1000.0000 mg | Freq: Once | INTRAVENOUS | Status: AC
  Administered 2015-03-09: 1000 mg via INTRAVENOUS
  Filled 2015-03-09: qty 200

## 2015-03-09 NOTE — ED Notes (Signed)
carelink contacted for transport to [facility] 5N-ETA: 2 hours

## 2015-03-09 NOTE — H&P (Signed)
Date: 03/09/2015               Patient Name:  [name] MRN: [id number]  DOB: 1961-01-05 Age / Sex: 54 y.o., female   PCP: Charlesetta Shanks, MD              Medical Service: Internal Medicine Teaching Service              Attending Physician: Dr. Oval Linsey, MD    First Contact: Dr. Trudee Kuster Pager: [phone]  Second Contact: Dr. Gordy Levan Pager: [phone]            After Hours (After 5p/  First Contact Pager: [phone]  weekends / holidays): Second Contact Pager: [phone]   Chief Complaint:  Foot pain.  History of Present Illness: [name] is a 54 year old woman with history of cocaine abuse, levimasole-induced vasculitis, hypertension, and depression presenting with pain in her left toes, left ear, nose, and right index finger. 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 She reports continuing to use cocaine, most recently 5 days ago and developing worsening ulcers at multiple locations in her body. She has been seen in the ED multiple times over the past week, including 2 days ago when an early cellulitis was noted surrounding a foot wound. She was discharged on Bactrim and Keflex, but she reports not picking up the prescriptions because she has no money. Since that time, she has reported increasing pain. She does endorse palpitations, but she denies abdominal pain, shortness of breath, or chest pain.  In the ED, she was tachycardic to 104, but her vital signs were otherwise stable.  She was started on vancomycin. She endorsed suicidal thoughts to the ED physician related to her cocaine abuse and chronic disfigurement from her vasculitis. However, she does not currently wish to hurt herself.   Review of Systems: Review of Systems  Constitutional: Negative for fever, chills and diaphoresis.  Eyes: Positive for redness.  Respiratory: Negative for shortness of breath and wheezing.   Cardiovascular: Positive for palpitations. Negative for  chest pain.  Gastrointestinal: Negative for nausea and vomiting.  Genitourinary: Negative for dysuria.  Musculoskeletal:       Left toe pain, left foot pain, left ear pain, nose pain, pain in right axilla  Skin: Negative for rash.  Neurological: Positive for sensory change. Negative for headaches.  Psychiatric/Behavioral: Positive for depression and substance abuse. Negative for suicidal ideas.    Meds:  (Not in a hospital admission) No current facility-administered medications for this encounter.   Current Outpatient Prescriptions  Medication Sig Dispense Refill  . cephALEXin (KEFLEX) 500 MG capsule Take 1 capsule (500 mg total) by mouth 3 (three) times daily. (Patient not taking: Reported on 03/09/2015) 21 capsule 0  . naproxen (EC NAPROSYN) 500 MG EC tablet Take 1 tablet (500 mg total) by mouth 2 (two) times daily with a meal. (Patient not taking: Reported on 03/07/2015) 60 tablet 2  . predniSONE (DELTASONE) 20 MG tablet 3 Tabs PO Days 1-3, then 2 tabs PO Days 4-6, then 1 tab PO Day 7-9, then Half Tab PO Day 10-12 (Patient not taking: Reported on 03/07/2015) 20 tablet 0  . sulfamethoxazole-trimethoprim (BACTRIM DS,SEPTRA DS) 800-160 MG per tablet Take 1 tablet by mouth 2 (two) times daily. (Patient not taking: Reported on 03/09/2015) 14 tablet 0  . traZODone (DESYREL) 50 MG tablet Take 1 tablet (50 mg total) by mouth at bedtime and may repeat dose one time if  needed. (Patient not taking: Reported on 03/07/2015) 30 tablet 0    Allergies: Allergies as of 03/09/2015 - Review Complete 03/09/2015  Allergen Reaction Noted  . Acetaminophen Swelling    Past Medical History  Diagnosis Date  . 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 Followed by Dr. Louanne Skye  . Hypertension   . Cocaine abuse     ongoing with resultant vaculitis.  . Depression   . Normocytic anemia     BL Hgb 9.8-12. Last anemia panel 04/2010 - showing Fe 19, ferritin 101.  Pt on monthly B12 injections  . VASCULITIS 04/17/2010     2/2 levimasole toxicity vs autoimmune d/o     . CAP (community acquired pneumonia) 03/2014 X 2  . Daily headache   . Migraines     "probably 5-6/yr" (04/21/2014)  . Rheumatoid arthritis(714.0)     patient reported  . Inflammatory arthritis    Past Surgical History  Procedure Laterality Date  . Skin biopsy Bilateral 04/2010    shin nodules  . Irrigation and debridement abscess Bilateral 09/26/2013    Procedure: DEBRIDEMENT ULCERS BILATERAL THIGHS;  Surgeon: Gwenyth Ober, MD;  Location: [place];  Service: General;  Laterality: Bilateral;  . Hernia repair      "stomach"   Family History  Problem Relation Age of Onset  . Breast cancer Mother     Breast cancer  . Alcohol abuse Mother   . Colon cancer Maternal Aunt 58  . Alcohol abuse Father    History   Social History  . Marital Status: Single    Spouse Name: N/A  . Number of Children: 0  . Years of Education: 11th grade   Occupational History  . Disability     since 2011, due to her rheumatoid arthritis   Social History Main Topics  . Smoking status: Current Every Day Smoker -- 0.30 packs/day for 34 years    Types: Cigarettes  . Smokeless tobacco: Never Used     Comment: PATIENT HAS CUT BACK TO 5 CIGARETES A DAY  . Alcohol Use: No  . Drug Use: Yes    Special: "Crack" cocaine     Comment: States she still uses crack "a little bit".  . Sexual Activity: Not on file   Other Topics Concern  . Not on file   Social History Narrative   Unemployed:  cleaning in past   Living at [place]; has Medicaid.   crack/cocaine use; pt denies IVDU   tobacco:  1/2 ppd, trying to quit   alcohol:  none        Physical Exam: Filed Vitals:   03/09/15 2054  BP: 164/73  Pulse: 78  Temp: 98.7 F (37.1 C)  Resp: 16    Physical Exam  Constitutional: She is oriented to person, place, and time.  HENT:  Head: Normocephalic and atraumatic.  Extensive evidence of prior and current vasculitis wounds on nose, ears (L>R)  Eyes:  EOM are normal. Pupils are equal, round, and reactive to light.  Neck: Normal range of motion. Neck supple.  Cardiovascular: Normal rate and regular rhythm.   No murmur heard. Pulmonary/Chest: Effort normal. No respiratory distress. She has no wheezes.  Abdominal: Soft. Bowel sounds are normal. There is no tenderness.  Genitourinary:  Examined vaginal area, several pus-filled lesions without any vesicular lesions resembling those in her axillae  Musculoskeletal: She exhibits no edema.  Neurological: She is alert and oriented to person, place, and time. No cranial nerve deficit.  Skin: Skin is warm and dry. She is not diaphoretic.  See photos below; open wound between left 4th and 5th toes that is tender, no erythema, smaller open wound on medial aspect of left foot, tender spot on back of left ankle that is not open, right 3rd finger open wound. Lesions in both axillae that appear to be healing (right lesions appear less healed than left)  Psychiatric:  Tearful, expresses regret that she has continued to use cocaine, upset that she has caused her disfigurement, denies current suicidal or homicidal ideation                     Lab results: Basic Metabolic Panel:  Recent Labs  03/07/15 0331 03/09/15 1257  NA 140 138  K 3.4* 3.5  CL 109 107  CO2 22 26  GLUCOSE 97 77  BUN 19 17  CREATININE 1.02* 0.97  CALCIUM 8.8* 8.7*   CBC:  Recent Labs  03/07/15 0331 03/09/15 1257  WBC 3.4* 3.9*  NEUTROABS 2.2 2.6  HGB 10.0* 11.1*  HCT 28.5* 33.2*  MCV 88.0 89.2  PLT 335 358   Urine Drug Screen: Drugs of Abuse     Component Value Date/Time   LABOPIA NONE DETECTED 03/09/2015 1407   LABOPIA PPS 08/31/2012 1045   COCAINSCRNUR POSITIVE* 03/09/2015 1407   COCAINSCRNUR NEG 08/31/2012 1045   LABBENZ NONE DETECTED 03/09/2015 1407   LABBENZ NEG 08/31/2012 1045   LABBENZ NEGATIVE 06/12/2011 0221   AMPHETMU NONE DETECTED 03/09/2015 1407   AMPHETMU NEG 08/31/2012 1045    AMPHETMU NEGATIVE 06/12/2011 0221   THCU POSITIVE* 03/09/2015 1407   THCU NEG 08/31/2012 1045   THCU 63.4* 05/20/2012 1405   LABBARB NONE DETECTED 03/09/2015 1407   LABBARB NEG 08/31/2012 1045    Erythrocyte Sedimentation Rate     Component Value Date/Time   ESRSEDRATE 60* 03/09/2015 1257   Imaging results:  Mr Foot Left Wo Contrast  03/09/2015   CLINICAL DATA:  Diabetic foot ulcers.  EXAM: MRI OF THE LEFT FOREFOOT WITHOUT CONTRAST  TECHNIQUE: Multiplanar, multisequence MR imaging was performed. No intravenous contrast was administered.  COMPARISON:  Radiograph 03/09/2015  FINDINGS: There are mild degenerative changes involving the forefoot most notably at the first metatarsal phalangeal joint with a mild hallux valgus deformity. No findings suspicious for osteomyelitis or septic arthritis. Mild cellulitis and myositis. No focal soft tissue abscess or findings for pyomyositis. The lesion involving the proximal phalanx of the big toe is more likely under erosion and could be secondary to gout.  IMPRESSION: No MR findings suspicious for septic arthritis or osteomyelitis.  Mild cellulitis and myositis but no focal drainable soft tissue abscess.   Electronically Signed   By: Marijo Sanes M.D.   On: 03/09/2015 15:53   Dg Foot Complete Left  03/09/2015   CLINICAL DATA:  Infection of the left foot.  EXAM: LEFT FOOT - COMPLETE 3+ VIEW  COMPARISON:  Three views of the left foot 03/07/2015.  FINDINGS: Soft tissue swelling over the great toe is again noted. And erosion in the distal aspect of the proximal phalanx in the great toe may be related to osteomyelitis. Soft tissue swelling is seen over the fourth and fifth digits is well without other osseous lesions. Remainder the foot is unremarkable.  IMPRESSION: 1. Persistent soft tissue swelling most pronounced in the great toe, fourth and fifth digits. 2. Erosion the distal aspect of the proximal phalanx may be related to osteomyelitis.   Electronically Signed  By: San Morelle M.D.   On: 03/09/2015 14:03   Assessment & Plan by Problem: Principal Problem:   Cocaine-induced vascular disorder Active Problems:   Essential hypertension   Cocaine dependence   Major depression, recurrent   Vasculitis   Substance induced mood disorder  Ms. Jadene Stemmer is a 54 yo woman with a history of multiple wounds from her levimasole-induced vasculitis who presents with new wounds.    #Levimasole-induced vasculitis: Lesion between left 4th and 5th toes is open, but does not appear infected; there is slight erythema and some tenderness, but no warmth, purulence or extensive pain. Axillary lesions appear different from lesions in other areas, but appear to be healing. X-ray demonstrates erosion of the distal aspect of the proximal phalanx, potentially related to osteomyelitis, but MRI revealed no findings concerning for septic arthritis or osteomyelitis. Her ESR is elevated, but this is her baseline during episode of vasculitis.  - Admit to MedSurg - Discontinue vancomycin - Start prednisone 10 mg by mouth daily in setting of vasculitis flare - Oxycodone IR for pain 5 mg q6 hours - Wound care consult - Continue to treat wounds with Silvadene  #Hypertension: Currently 164/73. She was previously prescribed lisinopril, but she has not been taking his medication. - Continue to monitor blood pressure inpatient - Consider restarting lisinopril if her blood pressure remains elevated  #Cocaine abuse: UDS positive for cocaine, consistent with her report. - Continue cessation counseling  #Currently undomiciled: Patient does has found that no shelters are open. She also has not been able to acquire any of her medications. - Consult social work  #Depression: The patient has had many prior [facility] admissions for suicidal ideation. She endorsed some suicidal ideation to the ED provider, but is not suicidal at the time of the interview. She is supposed to be taking bupropion  150 mg by mouth daily and trazodone 50 mg by mouth PRN for sleep (per her last [facility] discharge and her most recent clinic visits); however, she has not been taking these. - Restart bupropion 150 mg by mouth  - Restart trazodone 50 mg by mouth PRN for sleep - Sitter overnight; re-evaluate in the am - Consider psychiatry consult in the am  Diet: Regular  DVT Ppx:  - [place] lovenox   Dispo: Disposition is deferred at this time, awaiting improvement of current medical problems. Anticipated discharge in approximately 1-3 day(s).   The patient does have a current PCP Charlesetta Shanks, MD), therefore will be require OPC follow-up after discharge.   The patient does have transportation limitations that hinder transportation to clinic appointments.

## 2015-03-09 NOTE — ED Notes (Signed)
Patient transported to MRI 

## 2015-03-09 NOTE — ED Notes (Signed)
Patient reports she is homeless and cannot keep her wounds clean.  She also reports bugs bite her outside.  She reports shelters are full.

## 2015-03-09 NOTE — Progress Notes (Signed)
Pt admitted to unit. States she has shingles in R axilla. Blisters noted and MD notified. Upon examination of pt, MD determined that the lesions were not shingles. She does have crusted wounds on outer surface of both ears, a crusted wound with small amount of bleeding on the tip of her nose, a bleeding wound on the distal aspect of her left foot which was cleaned and covered with gauze, and a small abrasion on her right middle finger which was cleaned and covered with gauze. She has been emotionally labile since coming to [facility]. After eating dinner, she has been much calmer and jovial and denies that she is suicidal. She states that she was in so much pain at [facility] ED that she said she wanted to die because of the pain. Sitter remains in the room with patient.

## 2015-03-09 NOTE — ED Provider Notes (Signed)
CSN: [id number]     Arrival date & time 03/09/15  1124 History   First MD Initiated Contact with Patient 03/09/15 1225     Chief Complaint  Patient presents with  . Foot Pain  . Hand Pain  . Wound Check     (Consider location/radiation/quality/duration/timing/severity/associated sxs/prior Treatment) HPI Comments: The patient is a 54 year old female, she has a history of significant for cocaine abuse, she uses cocaine habitually, unfortunately she had cocaine that was tainted with another product and has suffered from a vasculitis since that time. This vasculitis causes open sores on her ears, nose and on her extremities. She was seen recently for a foot wound, there was an early cellulitis and she was treated with Bactrim and Keflex, the patient states that she did not get the prescriptions filled, she states that she has no money to do this. She has had increasing pain and drainage with foul smelling drainage from her foot. Her symptoms are persistent, gradually worsening, she denies any abdominal pain shortness of breath or chest pain. She does feel as though she has had some racing heartbeats. She does endorse some suicidal thoughts which is related to her cocaine abuse and chronic deformity and disfigurement.  Patient is a 54 y.o. female presenting with lower extremity pain, hand pain, and wound check. The history is provided by the patient and medical records.  Foot Pain  Hand Pain  Wound Check    Past Medical History  Diagnosis Date  . 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 Followed by Dr. Louanne Skye  . Hypertension   . Cocaine abuse     ongoing with resultant vaculitis.  . Depression   . Normocytic anemia     BL Hgb 9.8-12. Last anemia panel 04/2010 - showing Fe 19, ferritin 101.  Pt on monthly B12 injections  . 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 CAP (community acquired pneumonia) 03/2014 X 2  . Daily headache   . Migraines     "probably  5-6/yr" (04/21/2014)  . Rheumatoid arthritis(714.0)     patient reported  . Inflammatory arthritis    Past Surgical History  Procedure Laterality Date  . Skin biopsy Bilateral 04/2010    shin nodules  . Irrigation and debridement abscess Bilateral 09/26/2013    Procedure: DEBRIDEMENT ULCERS BILATERAL THIGHS;  Surgeon: Gwenyth Ober, MD;  Location: [place];  Service: General;  Laterality: Bilateral;  . Hernia repair      "stomach"   Family History  Problem Relation Age of Onset  . Breast cancer Mother     Breast cancer  . Alcohol abuse Mother   . Colon cancer Maternal Aunt 45  . Alcohol abuse Father    History  Substance Use Topics  . Smoking status: Current Every Day Smoker -- 0.30 packs/day for 34 years    Types: Cigarettes  . Smokeless tobacco: Never Used     Comment: PATIENT HAS CUT BACK TO 5 CIGARETES A DAY  . Alcohol Use: No   OB History    No data available     Review of Systems  All other systems reviewed and are negative.     Allergies  Acetaminophen  Home Medications   Prior to Admission medications   Medication Sig Start Date End Date Taking? Authorizing Provider  cephALEXin (KEFLEX) 500 MG capsule Take 1 capsule (500 mg total) by mouth 3 (three) times daily. Patient not taking: Reported on 03/09/2015  03/07/15   Antonietta Breach, PA-C  naproxen (EC NAPROSYN) 500 MG EC tablet Take 1 tablet (500 mg total) by mouth 2 (two) times daily with a meal. Patient not taking: Reported on 03/07/2015 01/23/15   Sindy Guadeloupe Rivet, MD  predniSONE (DELTASONE) 20 MG tablet 3 Tabs PO Days 1-3, then 2 tabs PO Days 4-6, then 1 tab PO Day 7-9, then Half Tab PO Day 10-12 Patient not taking: Reported on 03/07/2015 03/04/15   Carlisle Cater, PA-C  sulfamethoxazole-trimethoprim (BACTRIM DS,SEPTRA DS) 800-160 MG per tablet Take 1 tablet by mouth 2 (two) times daily. Patient not taking: Reported on 03/09/2015 03/07/15 03/14/15  Antonietta Breach, PA-C  traZODone (DESYREL) 50 MG tablet Take 1 tablet (50 mg total) by  mouth at bedtime and may repeat dose one time if needed. Patient not taking: Reported on 03/07/2015 10/17/14   Kerrie Buffalo, NP   BP 136/70 mmHg  Pulse 104  Temp(Src) 97.8 F (36.6 C) (Oral)  Resp 18  SpO2 99% Physical Exam  Constitutional: She appears well-developed and well-nourished. No distress.  HENT:  Head: Normocephalic and atraumatic.  Mouth/Throat: Oropharynx is clear and moist. No oropharyngeal exudate.  Multiple scabbed over and open sores on the bilateral ears, and the nose, oropharynx is clear  Eyes: Conjunctivae and EOM are normal. Pupils are equal, round, and reactive to light. Right eye exhibits no discharge. Left eye exhibits no discharge. No scleral icterus.  Neck: Normal range of motion. Neck supple. No JVD present. No thyromegaly present.  Cardiovascular: Regular rhythm, normal heart sounds and intact distal pulses.  Exam reveals no gallop and no friction rub.   No murmur heard. Tachycardia to 105  Pulmonary/Chest: Effort normal and breath sounds normal. No respiratory distress. She has no wheezes. She has no rales.  Abdominal: Soft. Bowel sounds are normal. She exhibits no distension and no mass. There is no tenderness.  Musculoskeletal: Normal range of motion. She exhibits tenderness (tenderness to palpation over the distal left foot, this is at the base of the fourth and fifth toes, there is a purulent foul-smelling drainage coming from an open wound in the intertriginous area, multiple other open wounds). She exhibits no edema.  Lymphadenopathy:    She has no cervical adenopathy.  Neurological: She is alert. Coordination normal.  Follows commands, speech is intact  Skin: Skin is warm and dry. No rash noted. No erythema.  Open wounds as above  Psychiatric: She has a normal mood and affect. Her behavior is normal.  Tearful  Nursing note and vitals reviewed.   ED Course  Procedures (including critical care time) Labs Review Labs Reviewed  CBC WITH  DIFFERENTIAL/PLATELET - Abnormal; Notable for the following:    WBC 3.9 (*)    RBC 3.72 (*)    Hemoglobin 11.1 (*)    HCT 33.2 (*)    All other components within normal limits  BASIC METABOLIC PANEL - Abnormal; Notable for the following:    Calcium 8.7 (*)    All other components within normal limits  SEDIMENTATION RATE  C-REACTIVE PROTEIN  I-STAT CG4 LACTIC ACID, ED    Imaging Review Dg Foot Complete Left  03/09/2015   CLINICAL DATA:  Infection of the left foot.  EXAM: LEFT FOOT - COMPLETE 3+ VIEW  COMPARISON:  Three views of the left foot 03/07/2015.  FINDINGS: Soft tissue swelling over the great toe is again noted. And erosion in the distal aspect of the proximal phalanx in the great toe may be related to osteomyelitis.  Soft tissue swelling is seen over the fourth and fifth digits is well without other osseous lesions. Remainder the foot is unremarkable.  IMPRESSION: 1. Persistent soft tissue swelling most pronounced in the great toe, fourth and fifth digits. 2. Erosion the distal aspect of the proximal phalanx may be related to osteomyelitis.   Electronically Signed   By: San Morelle M.D.   On: 03/09/2015 14:03      MDM   Final diagnoses:  Infection  Abrasion, foot with infection, left, subsequent encounter    The patient has a tachycardia, she has open wounds which are draining foul-smelling material, she likely needs IV and about X and admission overnight to stabilize as the patient is not taking her antibodies outpatient. She is not appear toxic or septic but there is concern for worsening infection which could lead to potential osteomyelitis or potential amputation if untreated  Pt has ongoing infection - plain films show no signs of osteomyelitis on my read - no sub Q air -tachycardia present but no fever, WBC low - pt would benefit from overnight IV abx.  C/w IM resident - accepts in transfer.  Meds given in ED:  Medications  vancomycin (VANCOCIN) IVPB 1000  mg/200 mL premix (1,000 mg Intravenous New Bag/Given 03/09/15 1314)    New Prescriptions   No medications on file      Noemi Chapel, MD 03/09/15 1410

## 2015-03-09 NOTE — ED Notes (Addendum)
Pt reports multiple wounds to L foot, R hand, bil ears, nose. Ongoing and progressive over last several months. Seen at [facility] ED 2 days ago and treated with IV abx. Has not taken sock off of R foot since being seen so is unsure if wound has changed any. Sts "if I had a knife, I would kill myself." Sts she has these suicidal thoughts often. Pt very tearful in triage, reports this is because of the pain. Sts was given a Rx at [facility] but cannot get it filled.

## 2015-03-09 NOTE — ED Notes (Signed)
Patient transported to X-ray 

## 2015-03-09 NOTE — ED Notes (Signed)
Report given to josh carelink.  Pt is notified that she does not have any order for pain meds.  Admitting MD will be seeing her once she is at [facility]

## 2015-03-10 DIAGNOSIS — F329 Major depressive disorder, single episode, unspecified: Secondary | ICD-10-CM

## 2015-03-10 DIAGNOSIS — I10 Essential (primary) hypertension: Secondary | ICD-10-CM

## 2015-03-10 DIAGNOSIS — F14988 Cocaine use, unspecified with other cocaine-induced disorder: Secondary | ICD-10-CM

## 2015-03-10 DIAGNOSIS — Z590 Homelessness: Secondary | ICD-10-CM

## 2015-03-10 DIAGNOSIS — L959 Vasculitis limited to the skin, unspecified: Secondary | ICD-10-CM

## 2015-03-10 DIAGNOSIS — R21 Rash and other nonspecific skin eruption: Secondary | ICD-10-CM

## 2015-03-10 MED ORDER — ADULT MULTIVITAMIN W/MINERALS CH: 1.0000 | ORAL_TABLET | Freq: Every day | ORAL | Status: DC

## 2015-03-10 MED ORDER — KETOROLAC TROMETHAMINE 30 MG/ML IJ SOLN: 30.0000 mg | Freq: Four times a day (QID) | INTRAMUSCULAR | Status: DC | PRN

## 2015-03-10 MED ORDER — SILVER SULFADIAZINE 1 % EX CREA
TOPICAL_CREAM | Freq: Two times a day (BID) | CUTANEOUS | Status: DC
  Administered 2015-03-10 – 2015-03-11 (×3): via TOPICAL
  Filled 2015-03-10 (×2): qty 85

## 2015-03-10 MED ORDER — NAPROXEN 250 MG PO TABS
500.0000 mg | ORAL_TABLET | Freq: Once | ORAL | Status: AC | PRN
  Administered 2015-03-10: 500 mg via ORAL
  Filled 2015-03-10: qty 2

## 2015-03-10 MED ORDER — LISINOPRIL 10 MG PO TABS
10.0000 mg | ORAL_TABLET | Freq: Every day | ORAL | Status: DC
  Administered 2015-03-10 – 2015-03-11 (×2): 10 mg via ORAL
  Filled 2015-03-10 (×2): qty 1

## 2015-03-10 MED ORDER — NAPROXEN 250 MG PO TABS
500.0000 mg | ORAL_TABLET | Freq: Two times a day (BID) | ORAL | Status: DC
  Administered 2015-03-10: 500 mg via ORAL
  Filled 2015-03-10: qty 2

## 2015-03-10 MED ORDER — CEPHALEXIN 500 MG PO CAPS
500.0000 mg | ORAL_CAPSULE | Freq: Two times a day (BID) | ORAL | Status: DC
  Administered 2015-03-10 – 2015-03-11 (×4): 500 mg via ORAL
  Filled 2015-03-10 (×4): qty 1

## 2015-03-10 NOTE — Progress Notes (Signed)
CSW received several phone calls concerning Pt's d/c planning.   CSW explained to Dr. Trudee Kuster that Pt was able to be d/c'd from a CSW standpoint when medically stable. Pt was given resources and contact information for multiple housing options.  Pt was given food and IRC information.   Pt has no barriers from a CSW standpoint for d/c when medically stable.   CSW also spoke with Pt's nurse explaining above stated information.    [facility]  2S, 101M,3S, 5N, 6N [phone]

## 2015-03-10 NOTE — Progress Notes (Signed)
RN spoke again with patient about discharge plan.  When I stated to her she will be given a bus/taxi voucher to take her to homeless shelter, patient was less anxious/agitated.  Agreed to discharge plan for today.  No discharge orders in chart and according to note from [facility], patient to be discharged tomorrow d/t new antibiotics and pain medication prescribed.  Patient is resting quietly in bed with no suicidal ideations or thoughts of hurting herself or others.  Suicide sitter to be discontinued.  Nsg to monitor for status changes.

## 2015-03-10 NOTE — Consult Note (Signed)
WOC wound consult note Reason for Consult: evaluation of multiple levamisole vasculitic ulcers.  Pt well known to this [facility] nurse, I have seen her at each of her previous admissions.  Sad situation as I have explained I can not really help the ulcers and they will continue to occur unless she stops using the drug.  She verbalizes understanding each time Wound type: vasculitic ulcers related to drug use Pressure Ulcer POA: No Measurement:  Able to only minimally visualize the ear ulcers enough to see the are mostly clean, the foot wound is VERY tender and she cries out when you touch the foot.  The patient was able to take the dressing off after I saturated it with normal saline.   Left medial first met head 0.5cmx 0.5cm x 0.1cm  Left lateral foot at 4th and 5th met head fluctuant area, tender with oozing, does appear to have depth but patient is not able to tolerate me probing this area. Area including fluctuance is 2.0cm x 3.5cm. Not able to manipulate the foot to see in the web space that may be affected  Wound bed: areas on the ears have some necrosis, the left foot wounds are mostly clean but it is difficult to determine the wound base  Drainage (amount, consistency, odor) all sites oozing serosanguinous, some yellow drainage noted from the left ear.   Periwound: scarring over the entire body from previous lesions Dressing procedure/placement/frequency: Silvadene ordered, she has used this in the past to manage the ulcers, however the foot wound is much worse than her other wounds in the past have been.  Most often the ulcers are necrotic but not draining as much as this one. MD sticky note placed on the chart for this reason.  Discussed POC with patient and bedside nurse.  Re consult if needed, will not follow at this time. Thanks  Aianna Fahs [place], [place] [phone])

## 2015-03-10 NOTE — Progress Notes (Signed)
Subjective:    Currently, the patient reports improvement of her pain.  She says that a scab fell off of her ear last night, and the bleeding is controlled with a gauze pad taped to her ear.  She currently denies any suicidal ideation.  She is overall in a much improved mood, but she is still upset about her living situation as she has nowhere to go at discharge.  She was previously staying in a motel and with some friends, but she will not be able to return to either of these living situations.  She reports being unable to afford any of her medications.  Interval Events: -Blood pressure elevated to 164/73.  Vital signs otherwise stable.   Objective:    Vital Signs:   Temp:  [97.8 F (36.6 C)-99 F (37.2 C)] 98.1 F (36.7 C) (06/05 0609) Pulse Rate:  [78-104] 95 (06/05 0609) Resp:  [16-21] 16 (06/05 0609) BP: (136-170)/(70-110) 154/110 mmHg (06/05 0609) SpO2:  [98 %-100 %] 100 % (06/05 0609) Weight:  [100 lb 15.5 oz (45.8 kg)] 100 lb 15.5 oz (45.8 kg) (06/04 2054)    24-hour weight change: Weight change:   Intake/Output:   Intake/Output Summary (Last 24 hours) at 03/10/15 1123 Last data filed at 03/10/15 0100  Gross per 24 hour  Intake    365 ml  Output      0 ml  Net    365 ml      Physical Exam: General: Laughing, but occasionally tears up about living situation.  Lungs:  Normal respiratory effort. Clear to auscultation BL without crackles or wheezes.  Heart: RRR. S1 and S2 normal without gallop, murmur, or rubs.  Abdomen:  BS normoactive. Soft, Nondistended, non-tender.  No masses or organomegaly.  Skin: Multiple eschars oozing blood and tender to palpations on all extremities and ears bilaterally, scarring on nose and throughout skin.  No purulent drainage or erythema.    Labs:  Basic Metabolic Panel:  Recent Labs Lab 03/07/15 0331 03/09/15 1257  NA 140 138  K 3.4* 3.5  CL 109 107  CO2 22 26  GLUCOSE 97 77  BUN 19 17  CREATININE 1.02* 0.97  CALCIUM  8.8* 8.7*   CBC:  Recent Labs Lab 03/07/15 0331 03/09/15 1257  WBC 3.4* 3.9*  NEUTROABS 2.2 2.6  HGB 10.0* 11.1*  HCT 28.5* 33.2*  MCV 88.0 89.2  PLT 335 358   Erythrocyte Sedimentation Rate     Component Value Date/Time   ESRSEDRATE 60* 03/09/2015 1257   C-Reactive Protein     Component Value Date/Time   CRP 3.0* 03/07/2015 0331   Microbiology: Results for orders placed or performed during the hospital encounter of 01/14/15  MRSA PCR Screening     Status: None   Collection Time: 01/14/15  7:05 PM  Result Value Ref Range Status   MRSA by PCR NEGATIVE NEGATIVE Final    Comment:        The GeneXpert MRSA Assay (FDA approved for NASAL specimens only), is one component of a comprehensive MRSA colonization surveillance program. It is not intended to diagnose MRSA infection nor to guide or monitor treatment for MRSA infections.    Imaging: Mr Foot Left Wo Contrast  03/09/2015   CLINICAL DATA:  Diabetic foot ulcers.  EXAM: MRI OF THE LEFT FOREFOOT WITHOUT CONTRAST  TECHNIQUE: Multiplanar, multisequence MR imaging was performed. No intravenous contrast was administered.  COMPARISON:  Radiograph 03/09/2015  FINDINGS: There are mild degenerative changes involving the forefoot most  notably at the first metatarsal phalangeal joint with a mild hallux valgus deformity. No findings suspicious for osteomyelitis or septic arthritis. Mild cellulitis and myositis. No focal soft tissue abscess or findings for pyomyositis. The lesion involving the proximal phalanx of the big toe is more likely under erosion and could be secondary to gout.  IMPRESSION: No MR findings suspicious for septic arthritis or osteomyelitis.  Mild cellulitis and myositis but no focal drainable soft tissue abscess.   Electronically Signed   By: Marijo Sanes M.D.   On: 03/09/2015 15:53   Dg Foot Complete Left  03/09/2015   CLINICAL DATA:  Infection of the left foot.  EXAM: LEFT FOOT - COMPLETE 3+ VIEW  COMPARISON:   Three views of the left foot 03/07/2015.  FINDINGS: Soft tissue swelling over the great toe is again noted. And erosion in the distal aspect of the proximal phalanx in the great toe may be related to osteomyelitis. Soft tissue swelling is seen over the fourth and fifth digits is well without other osseous lesions. Remainder the foot is unremarkable.  IMPRESSION: 1. Persistent soft tissue swelling most pronounced in the great toe, fourth and fifth digits. 2. Erosion the distal aspect of the proximal phalanx may be related to osteomyelitis.   Electronically Signed   By: San Morelle M.D.   On: 03/09/2015 14:03       Medications:    Infusions:    Scheduled Medications: . enoxaparin (LOVENOX) injection  40 mg Subcutaneous QHS  . predniSONE  10 mg Oral Q breakfast  . traZODone  50 mg Oral QHS,MR X 1    PRN Medications: ketorolac, oxyCODONE   Assessment/ Plan:    Principal Problem:   Cocaine-induced vascular disorder Active Problems:   Essential hypertension   Cocaine dependence   Major depression, recurrent   Vasculitis   Substance induced mood disorder  #Levimasole-induced vasculitis There is no evidence of purulent drainage currently, and her white blood cell count is normal. Her ESR is typically elevated secondary to her vasculitis.  There is some controversy over whether steroids are of benefit in this condition, but they have helped her in the past. The patient knows that the ultimate treatment for her vasculitis is cessation from cocaine.  We will keep her overnight so she can be evaluated by wound care and we can switch her to NSAIDs for pain control, which she will be going home on. Wound care is concerned about possible purulence and recommends consulting orthopedics or Gen. surgery for examination of left foot, but no drainable abscess or foreign body was noted on MRI. Only mild cellulitis was noted. Orthopedics saw her during her previous admission and only recommended  cessation from cocaine. We will start antibiotics given the difference of the ulcer on her left foot from her typical ulcers. -Continue prednisone 10 mg daily. -Keflex 500 mg every 12 hours for 10 days, the patient can get this prescription for free at [facility] from oxycodone to naproxen 500 mg BID. -Continue treatment with Silvadene. -Continue regular diet.  #Hypertension Blood pressure elevated this morning. She has not been taking lisinopril, which was prescribed to her in the past for her blood pressure. -Restart lisinopril 10 g daily.  #Depression The patient currently denies any suicidal ideation, and her mood is typically improved. I believe she is low risk of suicide currently. She was previously seeing psychiatry as an outpatient, and has been started on bupropion and trazodone, but she has not been taking these due  to cost of medications. I do not feel that psychiatry will be able to add to her care at this point. -Continue bupropion 150 mg daily. -Continue trazodone 50 mg daily at bedtime. -Discontinue sitter.  #Cocaine abuse This appears to be driven by her depression and mental health issues. Her current living situation also appears to be playing a role. -Social worker consulted to discuss possible treatment programs.  #Folliculitis Small papules in bilateral armpits most consistent with folliculitis. No drainable abscess. -Keflex as above.  #Homelessness Her current living situation is the main issue limiting her discharge home. She has an inability to pay for her medications, and she is currently homeless. -Social worker consulted for help with living situation. -Care management consulted for medication help. -Patient can be discharged home after a safe discharge plan has been established.   DVT PPX - low molecular weight heparin  CODE STATUS - Full  CONSULTS PLACED - SW, CM  DISPO - Will monitor overnight on antibiotics and NSAIDs to assess  response and pain control.  Likely discharge tomorrow.  The patient does have a current PCP (Harumi Yamin, Karle Starch, MD) and does need an [facility] hospital follow-up appointment after discharge.    Is the [facility] hospital follow-up appointment a one-time only appointment? no.  Does the patient have transportation limitations that hinder transportation to clinic appointments? yes   SERVICE NEEDED AT [place]         Y = Yes, Blank = No PT:   OT:   RN:   Equipment:   Other:      Length of Stay:  day(s)   Signed: Charlesetta Shanks, MD  PGY-1, Internal Medicine Resident Pager: [phone] (7AM-5PM) 03/10/2015, 11:23 AM
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
## 2015-03-10 NOTE — Care Management Note (Signed)
Case Management Note  Patient Details  Name: [name] MRN: [id number] Date of Birth: Oct 29, 1960  Subjective/Objective:                   increasing pain Action/Plan:  Discharge planning Expected Discharge Date:  03/10/15               Expected Discharge Plan:  Homeless Shelter  In-House Referral:     Discharge planning Services  CM Consult, [facility]  Post Acute Care Choice:    Choice offered to:     DME Arranged:    DME Agency:     HH Arranged:    [place] Agency:     Status of Service:  Completed, signed off  Medicare Important Message Given:    Date Medicare IM Given:    Medicare IM give by:    Date Additional Medicare IM Given:    Additional Medicare Important Message give by:     If discussed at [place] of Stay Meetings, dates discussed:    Additional Comments: CM notes pt has Medicaid and therefore is not eligible for MATCH.  CM gave pt [facility] pamphlet for followup medical care any weekday morning this week from 9-10am.  No other CM needs communicated. Dellie Catholic, RN 03/10/2015, 11:56 AM

## 2015-03-10 NOTE — Progress Notes (Signed)
Subjective: Right foot wounds continue to be very painful. Tearful about becoming homeless after discharge and worried that she will start using again cocaine once she leaves the hospital. She does not want to return to homeless shelters because people stare at her scars. Hoping to get an apartment with [facility], where she currently sees a psychiatrist. She is willing to go to a homeless shelter while she works out applying to [employer]. However, per SW they are currently full. Additionally, she expresses that she does not wish to harm herself.  Objective: Vital signs in last 24 hours: Filed Vitals:   03/09/15 1941 03/09/15 2054 03/10/15 0609 03/10/15 1342  BP: 150/77 164/73 154/110 131/54  Pulse: 80 78 95 81  Temp: 99 F (37.2 C) 98.7 F (37.1 C) 98.1 F (36.7 C) 98.4 F (36.9 C)  TempSrc: Oral Oral Oral Oral  Resp: 17 16 16    Height:  5' 1.5" (1.562 m)    Weight:  45.8 kg (100 lb 15.5 oz)    SpO2: 98% 100% 100% 96%   Weight change:   Intake/Output Summary (Last 24 hours) at 03/10/15 1419 Last data filed at 03/10/15 1300  Gross per 24 hour  Intake    605 ml  Output      0 ml  Net    605 ml   General appearance: Tearful when talking about using cocaine, her family, and being homeless. Otherwise in no apparent distress Extremities: Many areas of dark brown to black wounds on lower extremities. Open sore on dorsal surface between bases of 1st and 2nd toes of right foot. Serosanguinous discharge. No purulence. Black base surrounding wound. Very tender to palpation and removing dressings.  Lab Results: Basic Metabolic Panel:  Recent Labs  03/09/15 1257  NA 138  K 3.5  CL 107  CO2 26  GLUCOSE 77  BUN 17  CREATININE 0.97  CALCIUM 8.7*   CBC:  Recent Labs  03/09/15 1257  WBC 3.9*  NEUTROABS 2.6  HGB 11.1*  HCT 33.2*  MCV 89.2  PLT 358   Urine Drug Screen: Drugs of Abuse     Component Value Date/Time   LABOPIA NONE DETECTED 03/09/2015 1407   LABOPIA PPS  08/31/2012 1045   COCAINSCRNUR POSITIVE* 03/09/2015 1407   COCAINSCRNUR NEG 08/31/2012 1045   LABBENZ NONE DETECTED 03/09/2015 1407   LABBENZ NEG 08/31/2012 1045   LABBENZ NEGATIVE 06/12/2011 0221   AMPHETMU NONE DETECTED 03/09/2015 1407   AMPHETMU NEG 08/31/2012 1045   AMPHETMU NEGATIVE 06/12/2011 0221   THCU POSITIVE* 03/09/2015 1407   THCU NEG 08/31/2012 1045   THCU 63.4* 05/20/2012 1405   LABBARB NONE DETECTED 03/09/2015 1407   LABBARB NEG 08/31/2012 1045    Studies/Results: Mr Foot Left Wo Contrast  03/09/2015   CLINICAL DATA:  Diabetic foot ulcers.  EXAM: MRI OF THE LEFT FOREFOOT WITHOUT CONTRAST  TECHNIQUE: Multiplanar, multisequence MR imaging was performed. No intravenous contrast was administered.  COMPARISON:  Radiograph 03/09/2015  FINDINGS: There are mild degenerative changes involving the forefoot most notably at the first metatarsal phalangeal joint with a mild hallux valgus deformity. No findings suspicious for osteomyelitis or septic arthritis. Mild cellulitis and myositis. No focal soft tissue abscess or findings for pyomyositis. The lesion involving the proximal phalanx of the big toe is more likely under erosion and could be secondary to gout.  IMPRESSION: No MR findings suspicious for septic arthritis or osteomyelitis.  Mild cellulitis and myositis but no focal drainable soft tissue abscess.   Electronically  Signed   By: Marijo Sanes M.D.   On: 03/09/2015 15:53   Dg Foot Complete Left  03/09/2015   CLINICAL DATA:  Infection of the left foot.  EXAM: LEFT FOOT - COMPLETE 3+ VIEW  COMPARISON:  Three views of the left foot 03/07/2015.  FINDINGS: Soft tissue swelling over the great toe is again noted. And erosion in the distal aspect of the proximal phalanx in the great toe may be related to osteomyelitis. Soft tissue swelling is seen over the fourth and fifth digits is well without other osseous lesions. Remainder the foot is unremarkable.  IMPRESSION: 1. Persistent soft tissue  swelling most pronounced in the great toe, fourth and fifth digits. 2. Erosion the distal aspect of the proximal phalanx may be related to osteomyelitis.   Electronically Signed   By: San Morelle M.D.   On: 03/09/2015 14:03   Medications:  Scheduled Meds: . cephALEXin  500 mg Oral Q12H  . enoxaparin (LOVENOX) injection  40 mg Subcutaneous QHS  . lisinopril  10 mg Oral Daily  . naproxen  500 mg Oral BID WC  . predniSONE  10 mg Oral Q breakfast  . silver sulfADIAZINE   Topical BID  . traZODone  50 mg Oral QHS,MR X 1   Continuous Infusions:  PRN Meds:. Assessment/Plan: Principal Problem:   Cocaine-induced vascular disorder Active Problems:   Essential hypertension   Cocaine dependence   Major depression, recurrent   Vasculitis   Substance induced mood disorder  #Cocaine-induced vasculitis, foot wounds, ear wounds (POA) No evidence of soft tissue absecess or osteomyelitis on MRI. Mild cellulitis and osteomyelitis.  - Cephalexin 500 mg BID for 10 days - Encourage patient to get this from [facility] as it is free there. - Prednisone 10 mg qdaily for 10 days - Wound care nurse consult communicated with Ms. Patella that the best help for wounds is stopping cocaine use. Other cleaned wounds and covered with gauze. Continued silvadene use. - Naproxen 500 mg BID for pain  #Depression, suicidal ideation (POA) SI no longer present. - Trazodone 50 mg qHS for insomnia - Per patient, this is prescribed by provider at [facility] (behavioral health services) - Wellbutrin is home med but has not been given during this hospitalization. Continue this medication at discharge. - If not discharged tomorrow, consider d/c sitter.  #Housing insecurity, medication non-adherence (POA) - Consulted SW - No open shelter beds at this time. Patient unwilling to live with friend she was previously living with while she sorts out housing because she is worried she will use cocaine if she goes back. Does not  have money for a motel. Patient wants [facility] at [facility] and was encouraged to follow up with them. Fearful that she will use cocaine if she is homeless. Given resources for [facility], outpatient rehab, and shelters.  - Has been to rehab in the past. Willing to go to rehab if it means she will not be homeless. Seems less willing to go for the sake of rehab alone. - Consulted with CM - With Medicaid, not eligible for MATCH. Advised her to go to [place] this week between 9-10 AM to establish care for frequent follow-up visits to assist with chronic health issues and medication adherence. - Educate patient about Medicaid covering medications. Assess for further barriers. Discharge with medications if possible.  #Essential Hypertension (POA) - Lisinopril 10 mg qdaily   #Dispo Planning See barriers as above. Primary concern is housing at time of discharge. -  Encouraged patient to call [facility] tomorrow morning (closed today) to inquire about the housing application process and if she is eligible. Additionally, encouraged patient to call shelters tomorrow about bed availability.  This is a Careers information officer Note.  The care of the patient was discussed with Dr. Trudee Kuster and the assessment and plan formulated with their assistance.  Please see their attached note for official documentation of the daily encounter.     Kateri Plummer, Med Student 03/10/2015, 2:19 PM

## 2015-03-10 NOTE — Progress Notes (Signed)
When this RN informed patient she was being discharged today, pt stated "They don't even know what is wrong with my foot! If the discharge me, I'll just go down to the emergency department and just fall out in the floor so they will have to take me in".  Informed CM and SW of stated conversation.

## 2015-03-11 DIAGNOSIS — F14988 Cocaine use, unspecified with other cocaine-induced disorder: Secondary | ICD-10-CM | POA: Diagnosis not present

## 2015-03-11 DIAGNOSIS — L959 Vasculitis limited to the skin, unspecified: Secondary | ICD-10-CM | POA: Diagnosis not present

## 2015-03-11 MED ORDER — KETOROLAC TROMETHAMINE 30 MG/ML IJ SOLN
30.0000 mg | Freq: Once | INTRAMUSCULAR | Status: AC
  Administered 2015-03-11: 30 mg via INTRAVENOUS
  Filled 2015-03-11: qty 1

## 2015-03-11 MED ORDER — LISINOPRIL 10 MG PO TABS: 10.0000 mg | ORAL_TABLET | Freq: Every day | ORAL | Status: DC

## 2015-03-11 MED ORDER — NAPROXEN 250 MG PO TABS
500.0000 mg | ORAL_TABLET | Freq: Once | ORAL | Status: AC
  Administered 2015-03-11: 500 mg via ORAL
  Filled 2015-03-11: qty 2

## 2015-03-11 MED ORDER — CEPHALEXIN 500 MG PO CAPS: 500.0000 mg | ORAL_CAPSULE | Freq: Two times a day (BID) | ORAL | Status: DC

## 2015-03-11 NOTE — Progress Notes (Signed)
D/C papers reviewed and signed after Pt refused to leave until she received a script for pain meds. Spoke to MD about Pts  Concerns. He called into the Pts room, then decided to admin naproxen prior to D/C after Pt stated she could not afford any OTC pain meds. Pt then became extremely upset concerning her D/C and the fact that she is homeless. I once again reviewed the paper she had been given with the list of area shelters and phone numbers. I then gave her the ordered naproxen, her night dose of keflex and a bus pass. The NT assisted her to a wheelchair and helped her to the bus stop.

## 2015-03-11 NOTE — Progress Notes (Signed)
Subjective:    VSS.  Very tearful this AM c/o pain but denies SI.  Difficult social situation.   Objective:    Vital Signs:   Temp:  [97.7 F (36.5 C)-98.4 F (36.9 C)] 97.7 F (36.5 C) (06/06 6073) Pulse Rate:  [63-81] 63 (06/06 0633) Resp:  [16] 16 (06/06 0633) BP: (122-131)/(53-65) 131/53 mmHg (06/06 0633) SpO2:  [96 %-100 %] 100 % (06/06 0633) Last BM Date: 03/09/15  24-hour weight change: Weight change:   Intake/Output:   Intake/Output Summary (Last 24 hours) at 03/11/15 1126 Last data filed at 03/11/15 0200  Gross per 24 hour  Intake    480 ml  Output      0 ml  Net    480 ml      Physical Exam: General: Very tearful, lying in bed.    Lungs:  Normal respiratory effort. Clear to auscultation BL without crackles or wheezes.  Heart: RRR. S1 and S2 normal without gallop, murmur, or rubs.  Abdomen:  BS normoactive. Soft, Nondistended, non-tender.  No masses or organomegaly.  Skin: Wound on the left foot draining foul smelling purulent fluid.  Multiple wounds oozing blood and tender to palpations on all extremities and ears bilaterally, scarring on nose and throughout skin.      Labs:  Basic Metabolic Panel:  Recent Labs Lab 03/07/15 0331 03/09/15 1257  NA 140 138  K 3.4* 3.5  CL 109 107  CO2 22 26  GLUCOSE 97 77  BUN 19 17  CREATININE 1.02* 0.97  CALCIUM 8.8* 8.7*   CBC:  Recent Labs Lab 03/07/15 0331 03/09/15 1257  WBC 3.4* 3.9*  NEUTROABS 2.2 2.6  HGB 10.0* 11.1*  HCT 28.5* 33.2*  MCV 88.0 89.2  PLT 335 358   Erythrocyte Sedimentation Rate     Component Value Date/Time   ESRSEDRATE 60* 03/09/2015 1257   C-Reactive Protein     Component Value Date/Time   CRP 3.0* 03/07/2015 0331   Microbiology: Results for orders placed or performed during the hospital encounter of 01/14/15  MRSA PCR Screening     Status: None   Collection Time: 01/14/15  7:05 PM  Result Value Ref Range Status   MRSA by PCR NEGATIVE NEGATIVE Final    Comment:         The GeneXpert MRSA Assay (FDA approved for NASAL specimens only), is one component of a comprehensive MRSA colonization surveillance program. It is not intended to diagnose MRSA infection nor to guide or monitor treatment for MRSA infections.    Imaging: Mr Foot Left Wo Contrast  03/09/2015   CLINICAL DATA:  Diabetic foot ulcers.  EXAM: MRI OF THE LEFT FOREFOOT WITHOUT CONTRAST  TECHNIQUE: Multiplanar, multisequence MR imaging was performed. No intravenous contrast was administered.  COMPARISON:  Radiograph 03/09/2015  FINDINGS: There are mild degenerative changes involving the forefoot most notably at the first metatarsal phalangeal joint with a mild hallux valgus deformity. No findings suspicious for osteomyelitis or septic arthritis. Mild cellulitis and myositis. No focal soft tissue abscess or findings for pyomyositis. The lesion involving the proximal phalanx of the big toe is more likely under erosion and could be secondary to gout.  IMPRESSION: No MR findings suspicious for septic arthritis or osteomyelitis.  Mild cellulitis and myositis but no focal drainable soft tissue abscess.   Electronically Signed   By: Marijo Sanes M.D.   On: 03/09/2015 15:53   Dg Foot Complete Left  03/09/2015   CLINICAL DATA:  Infection of the  left foot.  EXAM: LEFT FOOT - COMPLETE 3+ VIEW  COMPARISON:  Three views of the left foot 03/07/2015.  FINDINGS: Soft tissue swelling over the great toe is again noted. And erosion in the distal aspect of the proximal phalanx in the great toe may be related to osteomyelitis. Soft tissue swelling is seen over the fourth and fifth digits is well without other osseous lesions. Remainder the foot is unremarkable.  IMPRESSION: 1. Persistent soft tissue swelling most pronounced in the great toe, fourth and fifth digits. 2. Erosion the distal aspect of the proximal phalanx may be related to osteomyelitis.   Electronically Signed   By: San Morelle M.D.   On: 03/09/2015  14:03       Medications:    Infusions:    Scheduled Medications: . cephALEXin  500 mg Oral Q12H  . enoxaparin (LOVENOX) injection  40 mg Subcutaneous QHS  . ketorolac  30 mg Intravenous Once  . lisinopril  10 mg Oral Daily  . multivitamin with minerals  1 tablet Oral Q supper  . predniSONE  10 mg Oral Q breakfast  . silver sulfADIAZINE   Topical BID  . traZODone  50 mg Oral QHS,MR X 1    PRN Medications:    Assessment/ Plan:    Principal Problem:   Cocaine-induced vascular disorder Active Problems:   Essential hypertension   Cocaine dependence   Major depression, recurrent   Vasculitis   Substance induced mood disorder  Levimasole-induced vasculitis Wound on the left foot appears to be draining foul smelling purulent fluid.  Consulted wound care yesterday and is concerned about purulent drainage recommending consulting ortho.   -would d/c prednisone given purulent drainage  -cont keflex 500 mg every 12 hours x10 days, the patient can get this prescription for free at [facility]. -cont naproxen 500 mg BID. -cont tx with silvadene. -consult ortho  Hypertension -Cont lisinopril 10 g daily.  Depression Very difficult social situation.  Pt is currently homeless and abusing cocaine.  However, she currently denies any SI.  I believe she is low risk of suicide currently. She was previously seeing psychiatry as an outpatient, and has been started on bupropion and trazodone, but she has not been taking these due to cost of medications. I do not feel that psychiatry will be able to add to her care at this point. -cont bupropion 150 mg daily. -cont trazodone 50 mg qhs  Cocaine abuse This appears to be driven by her depression and mental health issues. Her current living situation also appears to be playing a role. -Social worker consulted to discuss possible treatment programs.  Folliculitis Small papules in bilateral armpits most consistent with folliculitis. No  drainable abscess. -Keflex as above.  Homelessness Her current living situation is the main issue limiting her discharge home. She has an inability to pay for her medications, and she is currently homeless. -Social worker consulted for help with living situation. -Care management consulted for medication help. -Patient can be discharged home after a safe discharge plan has been established.  Signed: Jones Bales, MD  PGY-2, Internal Medicine Resident 03/11/2015, 11:26 AM

## 2015-03-11 NOTE — Clinical Social Work Note (Signed)
RN provided with bus voucher for patient at time of discharge. Patient received homeless shelter resources from Weekend CSW. No other needs identified. CSW signing off.  Lubertha Sayres, [facility] Clinical Social Work Department Orthopedics [phone]) and Surgical [phone])

## 2015-03-11 NOTE — Consult Note (Signed)
Reason for Consult:  Left foot wound Referring Physician:   Darlina Sicilian, MD  [name] is an 54 y.o. female.  HPI:   54 yo female with wounds on her body associated with cocaine-induced vascular disorder.  Ortho is being consulted to assess a left foot dorsal wound.  Past Medical History  Diagnosis Date  . 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 Followed by Dr. Louanne Skye  . Hypertension   . Cocaine abuse     ongoing with resultant vaculitis.  . Depression   . Normocytic anemia     BL Hgb 9.8-12. Last anemia panel 04/2010 - showing Fe 19, ferritin 101.  Pt on monthly B12 injections  . 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 CAP (community acquired pneumonia) 03/2014 X 2  . Daily headache   . Migraines     "probably 5-6/yr" (04/21/2014)  . Rheumatoid arthritis(714.0)     patient reported  . Inflammatory arthritis     Past Surgical History  Procedure Laterality Date  . Skin biopsy Bilateral 04/2010    shin nodules  . Irrigation and debridement abscess Bilateral 09/26/2013    Procedure: DEBRIDEMENT ULCERS BILATERAL THIGHS;  Surgeon: Gwenyth Ober, MD;  Location: [facility];  Service: General;  Laterality: Bilateral;  . Hernia repair      "stomach"    Family History  Problem Relation Age of Onset  . Breast cancer Mother     Breast cancer  . Alcohol abuse Mother   . Colon cancer Maternal Aunt 37  . Alcohol abuse Father     Social History:  reports that she has been smoking Cigarettes.  She has a 10.2 pack-year smoking history. She has never used smokeless tobacco. She reports that she uses illicit drugs ("Crack" cocaine). She reports that she does not drink alcohol.  Allergies:  Allergies  Allergen Reactions  . Acetaminophen Swelling    REACTION: eyelid swelling    Medications: I have reviewed the patient's current medications.  Results for orders placed or performed during the hospital encounter of 03/09/15 (from the past 48 hour(s))  CBC  with Differential/Platelet     Status: Abnormal   Collection Time: 03/09/15 12:57 PM  Result Value Ref Range   WBC 3.9 (L) 4.0 - 10.5 K/uL   RBC 3.72 (L) 3.87 - 5.11 MIL/uL   Hemoglobin 11.1 (L) 12.0 - 15.0 g/dL   HCT 33.2 (L) 36.0 - 46.0 %   MCV 89.2 78.0 - 100.0 fL   MCH 29.8 26.0 - 34.0 pg   MCHC 33.4 30.0 - 36.0 g/dL   RDW 14.5 11.5 - 15.5 %   Platelets 358 150 - 400 K/uL   Neutrophils Relative % 67 43 - 77 %   Neutro Abs 2.6 1.7 - 7.7 K/uL   Lymphocytes Relative 28 12 - 46 %   Lymphs Abs 1.1 0.7 - 4.0 K/uL   Monocytes Relative 4 3 - 12 %   Monocytes Absolute 0.2 0.1 - 1.0 K/uL   Eosinophils Relative 1 0 - 5 %   Eosinophils Absolute 0.0 0.0 - 0.7 K/uL   Basophils Relative 0 0 - 1 %   Basophils Absolute 0.0 0.0 - 0.1 K/uL  Basic metabolic panel     Status: Abnormal   Collection Time: 03/09/15 12:57 PM  Result Value Ref Range   Sodium 138 135 - 145 mmol/L   Potassium 3.5 3.5 - 5.1 mmol/L   Chloride  107 101 - 111 mmol/L   CO2 26 22 - 32 mmol/L   Glucose, Bld 77 65 - 99 mg/dL   BUN 17 6 - 20 mg/dL   Creatinine, Ser 0.97 0.44 - 1.00 mg/dL   Calcium 8.7 (L) 8.9 - 10.3 mg/dL   GFR calc non Af Amer >60 >60 mL/min   GFR calc Af Amer >60 >60 mL/min    Comment: (NOTE) The eGFR has been calculated using the CKD EPI equation. This calculation has not been validated in all clinical situations. eGFR's persistently <60 mL/min signify possible Chronic Kidney Disease.    Anion gap 5 5 - 15  Sedimentation rate     Status: Abnormal   Collection Time: 03/09/15 12:57 PM  Result Value Ref Range   Sed Rate 60 (H) 0 - 22 mm/hr  I-Stat CG4 Lactic Acid, ED     Status: None   Collection Time: 03/09/15  1:04 PM  Result Value Ref Range   Lactic Acid, Venous 0.93 0.5 - 2.0 mmol/L  Urine rapid drug screen (hosp performed)not at [facility]     Status: Abnormal   Collection Time: 03/09/15  2:07 PM  Result Value Ref Range   Opiates NONE DETECTED NONE DETECTED   Cocaine POSITIVE (A) NONE DETECTED    Benzodiazepines NONE DETECTED NONE DETECTED   Amphetamines NONE DETECTED NONE DETECTED   Tetrahydrocannabinol POSITIVE (A) NONE DETECTED   Barbiturates NONE DETECTED NONE DETECTED    Comment:        DRUG SCREEN FOR MEDICAL PURPOSES ONLY.  IF CONFIRMATION IS NEEDED FOR ANY PURPOSE, NOTIFY LAB WITHIN 5 DAYS.        LOWEST DETECTABLE LIMITS FOR URINE DRUG SCREEN Drug Class       Cutoff (ng/mL) Amphetamine      1000 Barbiturate      200 Benzodiazepine   127 Tricyclics       517 Opiates          300 Cocaine          300 THC              50 Performed at [facility]   I-Stat CG4 Lactic Acid, ED     Status: None   Collection Time: 03/09/15  4:07 PM  Result Value Ref Range   Lactic Acid, Venous 1.33 0.5 - 2.0 mmol/L    Mr Foot Left Wo Contrast  03/09/2015   CLINICAL DATA:  Diabetic foot ulcers.  EXAM: MRI OF THE LEFT FOREFOOT WITHOUT CONTRAST  TECHNIQUE: Multiplanar, multisequence MR imaging was performed. No intravenous contrast was administered.  COMPARISON:  Radiograph 03/09/2015  FINDINGS: There are mild degenerative changes involving the forefoot most notably at the first metatarsal phalangeal joint with a mild hallux valgus deformity. No findings suspicious for osteomyelitis or septic arthritis. Mild cellulitis and myositis. No focal soft tissue abscess or findings for pyomyositis. The lesion involving the proximal phalanx of the big toe is more likely under erosion and could be secondary to gout.  IMPRESSION: No MR findings suspicious for septic arthritis or osteomyelitis.  Mild cellulitis and myositis but no focal drainable soft tissue abscess.   Electronically Signed   By: Marijo Sanes M.D.   On: 03/09/2015 15:53   Dg Foot Complete Left  03/09/2015   CLINICAL DATA:  Infection of the left foot.  EXAM: LEFT FOOT - COMPLETE 3+ VIEW  COMPARISON:  Three views of the left foot 03/07/2015.  FINDINGS: Soft tissue swelling over the great toe  is again noted. And erosion in the distal  aspect of the proximal phalanx in the great toe may be related to osteomyelitis. Soft tissue swelling is seen over the fourth and fifth digits is well without other osseous lesions. Remainder the foot is unremarkable.  IMPRESSION: 1. Persistent soft tissue swelling most pronounced in the great toe, fourth and fifth digits. 2. Erosion the distal aspect of the proximal phalanx may be related to osteomyelitis.   Electronically Signed   By: San Morelle M.D.   On: 03/09/2015 14:03    ROS Blood pressure 131/53, pulse 63, temperature 97.7 F (36.5 C), temperature source Oral, resp. rate 16, height 5' 1.5" (1.562 m), weight 45.8 kg (100 lb 15.5 oz), SpO2 100 %. Physical Exam  Constitutional: She is oriented to person, place, and time. She appears well-developed.  Cardiovascular: Normal rate.   Respiratory: Effort normal.  GI: Soft.  Musculoskeletal:       Feet:  Neurological: She is alert and oriented to person, place, and time.  Her left foot is well perfused with normal sensation   Assessment/Plan: Left dorsal foot wound. This wound can be treated with local wound care only.  There is no evidence of abscess or osteo and there is good bleeding tissue.  She should have her left foot soaked daily in dial soapy water followed drying it off and then placing silvadene and a new dry dressing daily.  Can follow-up as an outpatient.  No surgical indication thus far.  Kemet Nijjar Y 03/11/2015, 12:08 PM

## 2015-03-11 NOTE — Discharge Summary (Signed)
Name: [name] MRN: [id number] DOB: Sep 22, 1961 54 y.o. PCP: Charlesetta Shanks, MD  Date of Admission: 03/09/2015 11:26 AM Date of Discharge: 03/12/2015 Attending Physician: Oval Linsey, MD  Discharge Diagnosis: Principal Problem:   Vasculitis Active Problems:   Essential hypertension   Cocaine dependence   Cocaine-induced vascular disorder   Major depression, recurrent   Substance induced mood disorder  Discharge Medications:   Medication List    STOP taking these medications        naproxen 500 MG EC tablet  Commonly known as:  EC NAPROSYN     predniSONE 20 MG tablet  Commonly known as:  DELTASONE     sulfamethoxazole-trimethoprim 800-160 MG per tablet  Commonly known as:  BACTRIM DS,SEPTRA DS      TAKE these medications        cephALEXin 500 MG capsule  Commonly known as:  KEFLEX  Take 1 capsule (500 mg total) by mouth every 12 (twelve) hours. Starting 03/11/15 evening     lisinopril 10 MG tablet  Commonly known as:  PRINIVIL,ZESTRIL  Take 1 tablet (10 mg total) by mouth daily.     traZODone 50 MG tablet  Commonly known as:  DESYREL  Take 1 tablet (50 mg total) by mouth at bedtime and may repeat dose one time if needed.        Disposition and follow-up:   Ms.Suhey D Mcvicker was discharged from [facility] in Stable condition.  At the hospital follow up visit please address:  1.  Cocaine cessation, wound healing, follow up with psychiatry.  2.  Labs / imaging needed at time of follow-up: None.  3.  Pending labs/ test needing follow-up: None.  Follow-up Appointments: Follow-up Information    Follow up with Arman Filter, MD On 03/15/2015.   Specialty:  Internal Medicine   Why:  @ 1:15 pm   Contact information:   [place] [address] [phone]       Discharge Instructions:  It was a pleasure to care for you at [facility]. Please take the antibiotic keflex 500 mg twice a day starting once tonight, then twice a  day for eight more days. Please go to your [facility] clinic appointment with Dr Trudee Kuster Friday 6/10. Please return to the Emergency Department or seek medical attention if you have any new or worsening fever, chills, night sweats, or other worrisome medical condition.   Lottie Mussel, MD  Consultations: Orthopedics.  Procedures Performed:  Mr Foot Left Wo Contrast  03/09/2015   CLINICAL DATA:  Diabetic foot ulcers.  EXAM: MRI OF THE LEFT FOREFOOT WITHOUT CONTRAST  TECHNIQUE: Multiplanar, multisequence MR imaging was performed. No intravenous contrast was administered.  COMPARISON:  Radiograph 03/09/2015  FINDINGS: There are mild degenerative changes involving the forefoot most notably at the first metatarsal phalangeal joint with a mild hallux valgus deformity. No findings suspicious for osteomyelitis or septic arthritis. Mild cellulitis and myositis. No focal soft tissue abscess or findings for pyomyositis. The lesion involving the proximal phalanx of the big toe is more likely under erosion and could be secondary to gout.  IMPRESSION: No MR findings suspicious for septic arthritis or osteomyelitis.  Mild cellulitis and myositis but no focal drainable soft tissue abscess.   Electronically Signed   By: Marijo Sanes M.D.   On: 03/09/2015 15:53   Dg Foot Complete Left  03/09/2015   CLINICAL DATA:  Infection of the left foot.  EXAM: LEFT FOOT - COMPLETE  3+ VIEW  COMPARISON:  Three views of the left foot 03/07/2015.  FINDINGS: Soft tissue swelling over the great toe is again noted. And erosion in the distal aspect of the proximal phalanx in the great toe may be related to osteomyelitis. Soft tissue swelling is seen over the fourth and fifth digits is well without other osseous lesions. Remainder the foot is unremarkable.  IMPRESSION: 1. Persistent soft tissue swelling most pronounced in the great toe, fourth and fifth digits. 2. Erosion the distal aspect of the proximal phalanx may be related to osteomyelitis.    Electronically Signed   By: San Morelle M.D.   On: 03/09/2015 14:03   Dg Foot Complete Left  03/07/2015   CLINICAL DATA:  LEFT foot pain and wound.  History of vasculitis.  EXAM: LEFT FOOT - COMPLETE 3+ VIEW  COMPARISON:  LEFT foot radiograph January 30, 2010  FINDINGS: No acute fracture deformity. No dislocation. Moderate hallux valgus deformity appears mildly advanced from prior examination with mild first metatarsophalangeal osteoarthrosis. No destructive bony lesions. Debris projecting in the dorsum of the forefoot with soft tissue swelling. No subcutaneous gas.  IMPRESSION: Dorsal forefoot soft tissue swelling with debris, which could be external to the patient, recommend direct inspection.  No acute fracture deformity or dislocation.   Electronically Signed   By: Elon Alas M.D.   On: 03/07/2015 03:57   Admission HPI:  [name] is a 54 year old woman with history of cocaine abuse, levimasole-induced vasculitis, hypertension, and depression presenting with pain in her left toes, left ear, nose, and right index finger. 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 She reports continuing to use cocaine, most recently 5 days ago and developing worsening ulcers at multiple locations in her body. She has been seen in the ED multiple times over the past week, including 2 days ago when an early cellulitis was noted surrounding a foot wound. She was discharged on Bactrim and Keflex, but she reports not picking up the prescriptions because she has no money. Since that time, she has reported increasing pain. She does endorse palpitations, but she denies abdominal pain, shortness of breath, or chest pain.  In the ED, she was tachycardic to 104, but her vital signs were otherwise stable. She was started on vancomycin. She endorsed suicidal thoughts to the ED physician related to her cocaine abuse and chronic disfigurement from her vasculitis. However, she  does not currently wish to hurt herself.   Hospital Course by problem list: Principal Problem:   Vasculitis Active Problems:   Essential hypertension   Cocaine dependence   Cocaine-induced vascular disorder   Major depression, recurrent   Substance induced mood disorder   #Levimasole-induced vasculitis The patient has a long-standing history of vasculitis secondary to her cocaine use. She reported last using cocaine 4-5 days prior to admission, and her UDS was positive for cocaine and THC. She had developed multiple areas of ulceration throughout her body. The most concerning lesions were located on her left dorsal foot and left first toe. Initial x-ray imaging of her foot was concerning for osteomyelitis, but MRI showed no evidence of osteomyelitis or abscess. She was initially started on prednisone 10 mg daily and Keflex 500 mg twice a day, and wound care was consulted. Wound care was concerned about the amount of drainage from the ulcer on her left foot, and they recommended consulting orthopedics. Orthopedics saw the patient and did not see any evidence of abscess or osteomyelitis. They  recommended local wound care with daily soaking of her left foot and Silvadene placement prior to redressing. Given the concern for delayed wound healing with steroids, prednisone was discontinued at discharge. The patient was instructed to pick up Keflex at [facility], where she can receive the medication for free to complete a ten-day course.  #Cocaine abuse Social work was consulted and provided the patient with options for rehabilitation. The patient refused inpatient rehabilitation facilities at discharge. The patient was continually counseled on the benefits of avoiding cocaine use in the future.  #Depression The patient has a long history of depression, and she expressed some hopelessness secondary to her physical disfigurement in the emergency room. On arrival to [facility], the patient denied any  suicidal ideation, and she had significant improvement in her mood. She was restarted on her previously prescribed bupropion 150 mg daily and trazodone 50 mg daily at bedtime. Follow-up, ensure that the patient follows up with psychiatry.  #Hypertension The patient was previously started on lisinopril 10 mg daily, but she had not been taking this medication. Her blood pressure was elevated during the hospitalization, so this was restarted and prescribed at discharge.  #Folliculitis The patient was noted to have small papules in her bilateral armpits, most consistent with folliculitis. Her course of Keflex should help this resolve.  #Homelessness The patient was currently living on the streets after recently leaving a hotel where she had lived for a couple of days. Care management and social work were consulted to help with placement, but the patient refused to live in a shelter at discharge. Instead, she arranged to stay with a friend for the immediate future.  Discharge Vitals:   BP 128/58 mmHg  Pulse 75  Temp(Src) 98.4 F (36.9 C) (Oral)  Resp 18  Ht 5' 1.5" (1.562 m)  Wt 100 lb 15.5 oz (45.8 kg)  BMI 18.77 kg/m2  SpO2 99%  Discharge Labs:  No results found for this or any previous visit (from the past 24 hour(s)).  Signed: Charlesetta Shanks, MD 03/12/2015, 12:17 PM    Services Ordered on Discharge: None. Equipment Ordered on Discharge: None.

## 2015-03-11 NOTE — Progress Notes (Signed)
Subjective: No acute events overnight. [name] expressed 8.5/10 pain along her right and left pinna this morning. She endorses picking off some eschar recently from her ears. At rest, her left foot does not bother her but endorses significant pain while walking. Per nursing, she has trouble walking on that foot. She says she sometimes thinks about hurting herself but is not thinking about it right now. She reports that she has chosen to stay with a friend while she continues to work on her living situation. She has not been able to fill prescriptions in the past due to a $3 copay with Medicaid. She does not have $3 now and would not be able to fill her medications for at least a few days after discharge.  Objective: Vital signs in last 24 hours: Filed Vitals:   03/10/15 1342 03/10/15 2026 03/11/15 0633 03/11/15 1230  BP: 131/54 122/65 131/53 128/58  Pulse: 81 63 63 75  Temp: 98.4 F (36.9 C) 98.4 F (36.9 C) 97.7 F (36.5 C) 98.4 F (36.9 C)  TempSrc: Oral Oral Oral   Resp:  16 16 18   Height:      Weight:      SpO2: 96% 100% 100% 99%    Intake/Output Summary (Last 24 hours) at 03/11/15 1351 Last data filed at 03/11/15 1231  Gross per 24 hour  Intake    480 ml  Output      0 ml  Net    480 ml   General appearance: weeping, alert, grimaces as she touches her ears or as we unwrap foot wound Ears: Raw ulcers on lateral pinna bilaterally. Black eschar from yesterday has been removed. Moist. No purulent discharge. Lungs: clear to auscultation bilaterally, no increased work breathing Heart: regular rate and rhythm, S1, S2 normal, no murmur, rub or gallop Extremities: Worsening ulcer left dorsum of foot, proximal to 4th toe base. Macerated appearance. Thin black base around it. No surrounding erythema. Appeares moist. Bloody discharge. No purulence.   Pulses: 2+ radial and dorsalis pedis bilaterally  Lab Results: No labs this AM.  Studies/Results: Mr Foot Left Wo  Contrast  03/09/2015   CLINICAL DATA:  Diabetic foot ulcers.  EXAM: MRI OF THE LEFT FOREFOOT WITHOUT CONTRAST  TECHNIQUE: Multiplanar, multisequence MR imaging was performed. No intravenous contrast was administered.  COMPARISON:  Radiograph 03/09/2015  FINDINGS: There are mild degenerative changes involving the forefoot most notably at the first metatarsal phalangeal joint with a mild hallux valgus deformity. No findings suspicious for osteomyelitis or septic arthritis. Mild cellulitis and myositis. No focal soft tissue abscess or findings for pyomyositis. The lesion involving the proximal phalanx of the big toe is more likely under erosion and could be secondary to gout.  IMPRESSION: No MR findings suspicious for septic arthritis or osteomyelitis.  Mild cellulitis and myositis but no focal drainable soft tissue abscess.   Electronically Signed   By: Marijo Sanes M.D.   On: 03/09/2015 15:53   Medications:  Scheduled Meds: . cephALEXin  500 mg Oral Q12H  . enoxaparin (LOVENOX) injection  40 mg Subcutaneous QHS  . lisinopril  10 mg Oral Daily  . multivitamin with minerals  1 tablet Oral Q supper  . silver sulfADIAZINE   Topical BID  . traZODone  50 mg Oral QHS,MR X 1   PRN Meds:. Assessment/Plan: Principal Problem:   Cocaine-induced vascular disorder Active Problems:   Essential hypertension   Cocaine dependence   Major depression, recurrent   Vasculitis   Substance induced  mood disorder  #Cocaine-induced vasculitis, worsening left foot wound, ear wounds (POA): Worsening No evidence of soft tissue absecess or osteomyelitis on MRI.  - Orthopedic surgery consulted after foot wound worsened - Recommend local wound care only. Recommend soaking foot in dial soaping water, using silvadene, and dry dressing daily.  - Cephalexin 500 mg BID for 10 days  - D/c prednisone to assist with wound healing - Naproxen 500 mg BID for pain  #Depression, suicidal ideation (POA): Resolved SI no longer  present. - Trazodone 50 mg qHS for insomnia - Per patient, this is prescribed by provider at [facility] (behavioral health services) - Wellbutrin is home med but has not been given during this hospitalization. Continue this medication at discharge.  #Housing insecurity, medication non-adherence (POA) - Willing to live with friend while she works on more permanent housing. SW provided resources for shelters, rehab, and [facility]. No open shelter beds when SW called on Sunday. - Patient desires apartment with [place]. Encouraged patient to call them to inquire about application process. - Consulted CM - With Medicaid, not eligible for MATCH. Advised her to go to [facility] this week between 9-10 AM to establish care for frequent follow-up visits to assist with chronic health issues and medication adherence. - Working with CM to get course of Keflex from [facility] prior to discharge.   #Essential Hypertension (POA) - Lisinopril 10 mg qdaily   #FEN/GI - No IVF - Replete electrolytes as needed - Regular diet  #Dispo Planning - See housing and medication barriers as above.  - Keflex 500 mg BID for 8 more days (and this evening's dose if discharged prior to second dose) from [facility] - Follow-up appointment scheduled with PCP (Dr. Trudee Kuster) on 03/15/15.  This is a Careers information officer Note.  The care of the patient was discussed with Dr. Gordy Levan and the assessment and plan formulated with their assistance.  Please see their attached note for official documentation of the daily encounter.     Kateri Plummer, Med Student 03/11/2015, 1:51 PM

## 2015-03-11 NOTE — Discharge Instructions (Signed)
It was a pleasure to care for you at [facility]. Please take the antibiotic keflex 500 mg twice a day starting once tonight, then twice a day for eight more days. Please go to your [facility] clinic appointment with Dr Trudee Kuster Friday 6/10. Please return to the Emergency Department or seek medical attention if you have any new or worsening fever, chills, night sweats, or other worrisome medical condition.   Lottie Mussel, MD

## 2015-03-12 ENCOUNTER — Ambulatory Visit (INDEPENDENT_AMBULATORY_CARE_PROVIDER_SITE_OTHER): Payer: Medicaid Other | Admitting: Internal Medicine

## 2015-03-12 ENCOUNTER — Encounter: Payer: Self-pay | Admitting: Internal Medicine

## 2015-03-12 VITALS — BP 155/76 | HR 86 | Temp 98.1°F | Wt 98.1 lb

## 2015-03-12 DIAGNOSIS — F339 Major depressive disorder, recurrent, unspecified: Secondary | ICD-10-CM

## 2015-03-12 DIAGNOSIS — F14988 Cocaine use, unspecified with other cocaine-induced disorder: Secondary | ICD-10-CM

## 2015-03-12 DIAGNOSIS — F14188 Cocaine abuse with other cocaine-induced disorder: Secondary | ICD-10-CM

## 2015-03-12 DIAGNOSIS — L959 Vasculitis limited to the skin, unspecified: Secondary | ICD-10-CM | POA: Diagnosis not present

## 2015-03-12 DIAGNOSIS — F17210 Nicotine dependence, cigarettes, uncomplicated: Secondary | ICD-10-CM | POA: Diagnosis not present

## 2015-03-12 DIAGNOSIS — I999 Unspecified disorder of circulatory system: Principal | ICD-10-CM

## 2015-03-12 MED ORDER — HYDROCODONE-IBUPROFEN 7.5-200 MG PO TABS: 1.0000 | ORAL_TABLET | Freq: Three times a day (TID) | ORAL | Status: DC | PRN

## 2015-03-12 MED ORDER — BUPROPION HCL ER (XL) 150 MG PO TB24: 150.0000 mg | ORAL_TABLET | ORAL | Status: DC

## 2015-03-12 NOTE — Assessment & Plan Note (Signed)
Patient had been compliant with Wellbutrin for her depression (and trazodone for sleep), but then ran out recently. She got a prescription for trazodone during her recent hospitalization. Also during this hospitalization, therapy was discussed and the patient is interested.  - Wellbutrin refilled - Continue trazodone as needed for sleep - Begin therapy (sent Shana message for help) - Discussed the importance of adherence to these medications

## 2015-03-12 NOTE — Progress Notes (Signed)
[facility] Subjective:   Patient ID: [name] female   DOB: Aug 31, 1961 54 y.o.   MRN: [id number]  HPI: [name] is a 54 y.o. female with a history of cocaine abuse, levimasole-induced vasculitis, hypertension, and depression presenting for medication refills following her hospitalization for new vasculitic lesions in her left toes, left ear, nose, and right index finger. She was discharged last night on a course of keflex without pain medication. She is currently in 10/10 pain, worst in her left foot. She stated today that she had lost a prescription for oxy IR given to her by her ED physician several days prior. She states that she wants to stay away from cocaine, but is in significant pain. She wishes to have a short course of pain medication.   She also wishes to pursue psychiatric follow up and would like a refill of her anti-depressant (Wellbutrin).     Past Medical History  Diagnosis Date  . 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 Followed by Dr. Louanne Skye  . Hypertension   . Cocaine abuse     ongoing with resultant vaculitis.  . Depression   . Normocytic anemia     BL Hgb 9.8-12. Last anemia panel 04/2010 - showing Fe 19, ferritin 101.  Pt on monthly B12 injections  . 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 CAP (community acquired pneumonia) 03/2014 X 2  . Daily headache   . Migraines     "probably 5-6/yr" (04/21/2014)  . Rheumatoid arthritis(714.0)     patient reported  . Inflammatory arthritis    Current Outpatient Prescriptions  Medication Sig Dispense Refill  . cephALEXin (KEFLEX) 500 MG capsule Take 1 capsule (500 mg total) by mouth every 12 (twelve) hours. Starting 03/11/15 evening 17 capsule 0  . HYDROcodone-ibuprofen (VICOPROFEN) 7.5-200 MG per tablet Take 1 tablet by mouth every 8 (eight) hours as needed for moderate pain. 10 tablet 0  . lisinopril (PRINIVIL,ZESTRIL) 10 MG tablet Take 1 tablet (10 mg total)  by mouth daily. 30 tablet 0  . traZODone (DESYREL) 50 MG tablet Take 1 tablet (50 mg total) by mouth at bedtime and may repeat dose one time if needed. (Patient not taking: Reported on 03/07/2015) 30 tablet 0   No current facility-administered medications for this visit.   Family History  Problem Relation Age of Onset  . Breast cancer Mother     Breast cancer  . Alcohol abuse Mother   . Colon cancer Maternal Aunt 32  . Alcohol abuse Father    History   Social History  . Marital Status: Single    Spouse Name: N/A  . Number of Children: 0  . Years of Education: 11th grade   Occupational History  . Disability     since 2011, due to her rheumatoid arthritis   Social History Main Topics  . Smoking status: Current Every Day Smoker -- 0.30 packs/day for 34 years    Types: Cigarettes  . Smokeless tobacco: Never Used     Comment: "Trying very hard".  . Alcohol Use: No  . Drug Use: Yes    Special: "Crack" cocaine     Comment: States she still uses crack "a little bit".  . Sexual Activity: Not on file   Other Topics Concern  . None   Social History Narrative   Unemployed:  cleaning in past   Living at [place]; has Medicaid.  crack/cocaine use; pt denies IVDU   tobacco:  1/2 ppd, trying to quit   alcohol:  none       Review of Systems: Constitutional: Negative for fever, chills and diaphoresis.  HEENT: No headaches or blurred vision Respiratory: Negative for shortness of breath and wheezing.  Cardiovascular: Negative for chest pain.  Gastrointestinal: Negative for nausea and vomiting.  Genitourinary: Negative for dysuria.  Musculoskeletal:   Left toe pain, left foot pain, left ear pain, nose pain, pain in right axilla  Skin: Negative for rash.  Psychiatric/Behavioral: Positive for depression and substance abuse. Negative for suicidal ideas.    Objective:  Physical Exam: Filed Vitals:   03/12/15 1503  BP: 155/76  Pulse: 86  Temp: 98.1 F (36.7 C)   TempSrc: Oral  Weight: 98 lb 1.6 oz (44.498 kg)  SpO2: 100%   Appearance: in NAD, not teary, but states that she is in pain HEENT: extensive evidence of prior and current vasculitis wounds on nose, ears (L>R), bandages in place Heart: RRR, normal S1S2 Lungs: CTAB, normal work of breathing, no wheezing Abdomen: BS+, soft, nontender Musculoskeletal: normal range of motion with no edema Neurologic: A&Ox3, grossly intact Skin: open wound between 4th and 5th toes that is tender, well-wrapped, ears wrapped as well (patient preferred that I not remove these bandages)  Assessment & Plan:  Case discussed with Dr. Lynnae January  Cocaine-induced vascular disorder Patient had a recent 2 day hospitalization for recurrent vasculitic wounds. The wound on the left lower extremity is causing the most pain (rated at at 10/10 in intensity). The right 3rd digit is also painful. Steroids were discontinued during the hospitalization and the patient was purposefully sent home without pain medication and with strong direction to stop using cocaine. She had an oxy IR prescription from an emergency department visit two days prior to admission that she was intending to fill; however, she claims to have lost the prescription.   - Will not refill lost oxy IR prescription - Will give the patient a 10 tablet course of vicoprofen 7.5 mg by mouth PRN for severe pain (lower street value in case she intends to sell) - Patient was told clearly that this was for the acute treatment of her pain, and would not be refilled - Continue silver gel application to active wounds - The importance of stopping cocaine was re-emphasized   Major depression, recurrent Patient had been compliant with Wellbutrin for her depression (and trazodone for sleep), but then ran out recently. She got a prescription for trazodone during her recent hospitalization. Also during this hospitalization, therapy was discussed and the patient is interested.  -  Wellbutrin refilled - Continue trazodone as needed for sleep - Begin therapy (sent [name] message for help) - Discussed the importance of adherence to these medications     Medications Ordered Meds ordered this encounter  Medications  . HYDROcodone-ibuprofen (VICOPROFEN) 7.5-200 MG per tablet    Sig: Take 1 tablet by mouth every 8 (eight) hours as needed for moderate pain.    Dispense:  10 tablet    Refill:  0   Other Orders No orders of the defined types were placed in this encounter.

## 2015-03-12 NOTE — Patient Instructions (Signed)
Use the pain medication for severe pain during this acute episode.  Please take your Wellbutrin for your depression and follow up with the therapist.   General Instructions:   Thank you for bringing your medicines today. This helps [place] keep you safe from mistakes.   Progress Toward Treatment Goals:  Treatment Goal 09/24/2014  Blood pressure deteriorated  Stop smoking smoking the same amount    Self Care Goals & Plans:  Self Care Goal 02/21/2015  Manage my medications take my medicines as prescribed; bring my medications to every visit; refill my medications on time  Monitor my health -  Eat healthy foods eat more vegetables; eat foods that are low in salt; eat baked foods instead of fried foods  Be physically active find an activity I enjoy  Stop smoking set a quit date and stop smoking  Meeting treatment goals -    No flowsheet data found.   Care Management & Community Referrals:  Referral 06/19/2013  Referrals made for care management support none needed  Referrals made to community resources none

## 2015-03-12 NOTE — Assessment & Plan Note (Signed)
Patient had a recent 2 day hospitalization for recurrent vasculitic wounds. The wound on the left lower extremity is causing the most pain (rated at at 10/10 in intensity). The right 3rd digit is also painful. Steroids were discontinued during the hospitalization and the patient was purposefully sent home without pain medication and with strong direction to stop using cocaine. She had an oxy IR prescription from an emergency department visit two days prior to admission that she was intending to fill; however, she claims to have lost the prescription.   - Will not refill lost oxy IR prescription - Will give the patient a 10 tablet course of vicoprofen 7.5 mg by mouth PRN for severe pain (lower street value in case she intends to sell) - Patient was told clearly that this was for the acute treatment of her pain, and would not be refilled - Continue silver gel application to active wounds - The importance of stopping cocaine was re-emphasized

## 2015-03-14 ENCOUNTER — Telehealth: Payer: Self-pay | Admitting: Internal Medicine

## 2015-03-14 NOTE — Telephone Encounter (Signed)
Call to patient to confirm appointment for 03/15/15 at 1:15 lmtcb

## 2015-03-14 NOTE — Progress Notes (Signed)
Internal Medicine Clinic Attending  Case discussed with Dr. Sherrine Maples at the time of the visit.  We reviewed the resident's history and exam and pertinent patient test results.  I agree with the assessment, diagnosis, and plan of care documented in the resident's note. I saw Ms Rusk in the hallway and spoke to her briefly. Her ears were bandaged and her spirits were not quite as high as normal but she was in NAD and pleasant. Difficult case - lesions are undoubtedly painful but would heal with cocaine cessation but Ms Gallogly has not been successful long term in her cocaine cessation.

## 2015-03-14 NOTE — Addendum Note (Signed)
Addended by: Drucilla Schmidt E on: 03/14/2015 11:57 AM   Modules accepted: Orders

## 2015-03-15 ENCOUNTER — Ambulatory Visit (INDEPENDENT_AMBULATORY_CARE_PROVIDER_SITE_OTHER): Payer: Medicaid Other | Admitting: Internal Medicine

## 2015-03-15 ENCOUNTER — Encounter: Payer: Self-pay | Admitting: Internal Medicine

## 2015-03-15 VITALS — BP 109/67 | HR 102 | Temp 98.7°F | Ht 61.5 in | Wt 94.0 lb

## 2015-03-15 DIAGNOSIS — F339 Major depressive disorder, recurrent, unspecified: Secondary | ICD-10-CM

## 2015-03-15 DIAGNOSIS — L959 Vasculitis limited to the skin, unspecified: Secondary | ICD-10-CM

## 2015-03-15 DIAGNOSIS — I10 Essential (primary) hypertension: Secondary | ICD-10-CM

## 2015-03-15 DIAGNOSIS — I999 Unspecified disorder of circulatory system: Secondary | ICD-10-CM

## 2015-03-15 DIAGNOSIS — F331 Major depressive disorder, recurrent, moderate: Secondary | ICD-10-CM

## 2015-03-15 DIAGNOSIS — F14988 Cocaine use, unspecified with other cocaine-induced disorder: Secondary | ICD-10-CM

## 2015-03-15 DIAGNOSIS — F14188 Cocaine abuse with other cocaine-induced disorder: Secondary | ICD-10-CM

## 2015-03-15 MED ORDER — TRAZODONE HCL 50 MG PO TABS: 50.0000 mg | ORAL_TABLET | Freq: Every evening | ORAL | Status: DC | PRN

## 2015-03-15 MED ORDER — NAPROXEN 500 MG PO TABS: 500.0000 mg | ORAL_TABLET | Freq: Two times a day (BID) | ORAL | Status: DC

## 2015-03-15 NOTE — Patient Instructions (Signed)
Thank you for coming to clinic today [name].  General instructions: -Your wounds look much better.  Keep taking your antibiotics until you have completed all of the pills. -I sent a prescription for trazodone to your pharmacy. -For pain, I recommend that you take naproxen 500 mg twice a day.  I sent a prescription to your pharmacy, but you can get this over the counter. -For the wounds on your foot and heal, apply silvadene and a foam dressing every 3-5 days.  Leave the dressing on the wound to allow it to heal.  After 3-5 days, wash of the silvadene with water and a gentle soap or saline before redressing. -Make sure to follow up with the wound care clinic for further instructions. -Please make a follow up appointment to return to clinic in 2 months.  Please bring your medicines with you each time you come.   Medicines may be  Eye drops  Herbal   Vitamins  Pills  Seeing these help [place] take care of you.

## 2015-03-15 NOTE — Progress Notes (Signed)
   Subjective:    [name], female    DOB: 23-Aug-1961, 54 y.o.   MRN: [id number]  HPI [name] is a 54 year old woman with a history of cocaine abuse, levimasole-induced vasculitis, hypertension, and depression presenting for hospital follow-up after recent hospitalization for a flare of her vasculitis.  She was seen in the [facility] clinic on 03/12/2015, 1 day after hospitalization, due to ongoing pain. She was given 10 tablets of Vicoprofen 7.5 mg when necessary at that time.  She reports that the Vicoprofen made her throw up, so she hasn't been taking it.  She reports that her pain is the same, but her wounds have been getting better.  She reports picking up the Keflex, lisinopril, and Wellbutrin, which she has been taking.  She reports that her mood has been good over the past few days and she denies any suicidal ideation.  She has been staying with a friend currently, and she is looking for a new living situation.  She reports not getting her trazodone, and she would like a new prescription.  Review of Systems  Constitutional: Negative for fever, chills and fatigue.  HENT: Negative for congestion, rhinorrhea and sore throat.   Respiratory: Negative for cough and shortness of breath.   Cardiovascular: Negative for chest pain.  Gastrointestinal: Negative for nausea, vomiting, abdominal pain, diarrhea and constipation.  Genitourinary: Negative for dysuria and difficulty urinating.  Musculoskeletal: Positive for myalgias.  Skin: Positive for rash.  Neurological: Negative for dizziness, weakness and numbness.      Objective:   Physical Exam  Constitutional: She is oriented to person, place, and time. She appears well-developed and well-nourished. No distress.  Eyes: Conjunctivae and EOM are normal. Pupils are equal, round, and reactive to light. No scleral icterus.  Cardiovascular: Normal rate, regular rhythm and normal heart sounds.   Pulmonary/Chest: Effort normal and breath  sounds normal. No respiratory distress. She has no wheezes.  Abdominal: Soft. Bowel sounds are normal. She exhibits no distension. There is no tenderness.  Musculoskeletal: Normal range of motion. She exhibits no edema or tenderness.  Neurological: She is alert and oriented to person, place, and time. No cranial nerve deficit. She exhibits normal muscle tone.  Skin:  Healing ulcers on bilateral ears, right third finger, dorsum of left foot, and left heel. No drainage, erythema, or signs of infection.  Psychiatric: She has a normal mood and affect.      Assessment & Plan:  Please see problem-based assessment and plan.

## 2015-03-15 NOTE — Assessment & Plan Note (Signed)
BP Readings from Last 3 Encounters:  03/15/15 109/67  03/12/15 155/76  03/11/15 128/58    Lab Results  Component Value Date   NA 138 03/09/2015   K 3.5 03/09/2015   CREATININE 0.97 03/09/2015    Assessment: Blood pressure control: controlled Progress toward BP goal:  at goal Comments: Patient picked up and is taking her blood pressure medication.  Plan: Medications:  continue current medications: Lisinopril 10 mg daily.

## 2015-03-15 NOTE — Assessment & Plan Note (Signed)
The patient's mood has improved significantly since starting Wellbutrin. She reports some difficulty falling asleep, and she would like her trazodone prescription to be refilled. -Continue Wellbutrin 150 mg daily. -Refilled trazodone 50 mg daily at bedtime, repeat 1 as needed. -Clinical social worker was sent a message to help with setting up psychotherapy during her previous clinic visit.

## 2015-03-15 NOTE — Assessment & Plan Note (Signed)
The patient has not used cocaine since her hospitalization, and her ulcers secondary to her vasculitis have improved. No signs of infection currently. She was referred to wound care clinic, but she has not been seen there yet. She has picked up her Keflex, and has been compliant with the antibiotics. -Applauded cessation from cocaine. -Seen by Ivin Booty in clinic who changed her foot and heel dressings and provided recommendations. -Apply Silvadene and change foot and heel dressings every 3-5 days, leave foam dressing in place to promote healing. -Apply Silvadene to ear and hand ulcers daily. -Patient will be contacted with appointment in wound care clinic. -Naproxen 500 mg twice a day for pain. -Finish course of antibiotics. -Follow-up in clinic in 2 months.

## 2015-03-18 NOTE — Progress Notes (Signed)
Internal Medicine Clinic Attending  Case discussed with Dr. Trudee Kuster at the time of the visit.  We reviewed the resident's history and exam and pertinent patient test results.  I agree with the assessment, diagnosis, and plan of care documented in the resident's note.

## 2015-03-30 IMAGING — CR DG FOOT COMPLETE 3+V*R*
3 series · 3 of 3 positions shown · non-contrast
Comparison: 01/30/2010

CLINICAL DATA: Right foot pain and swelling.  No known injury.
History rheumatoid arthritis.

RIGHT FOOT COMPLETE - 3+ VIEW

[x foot ap right]
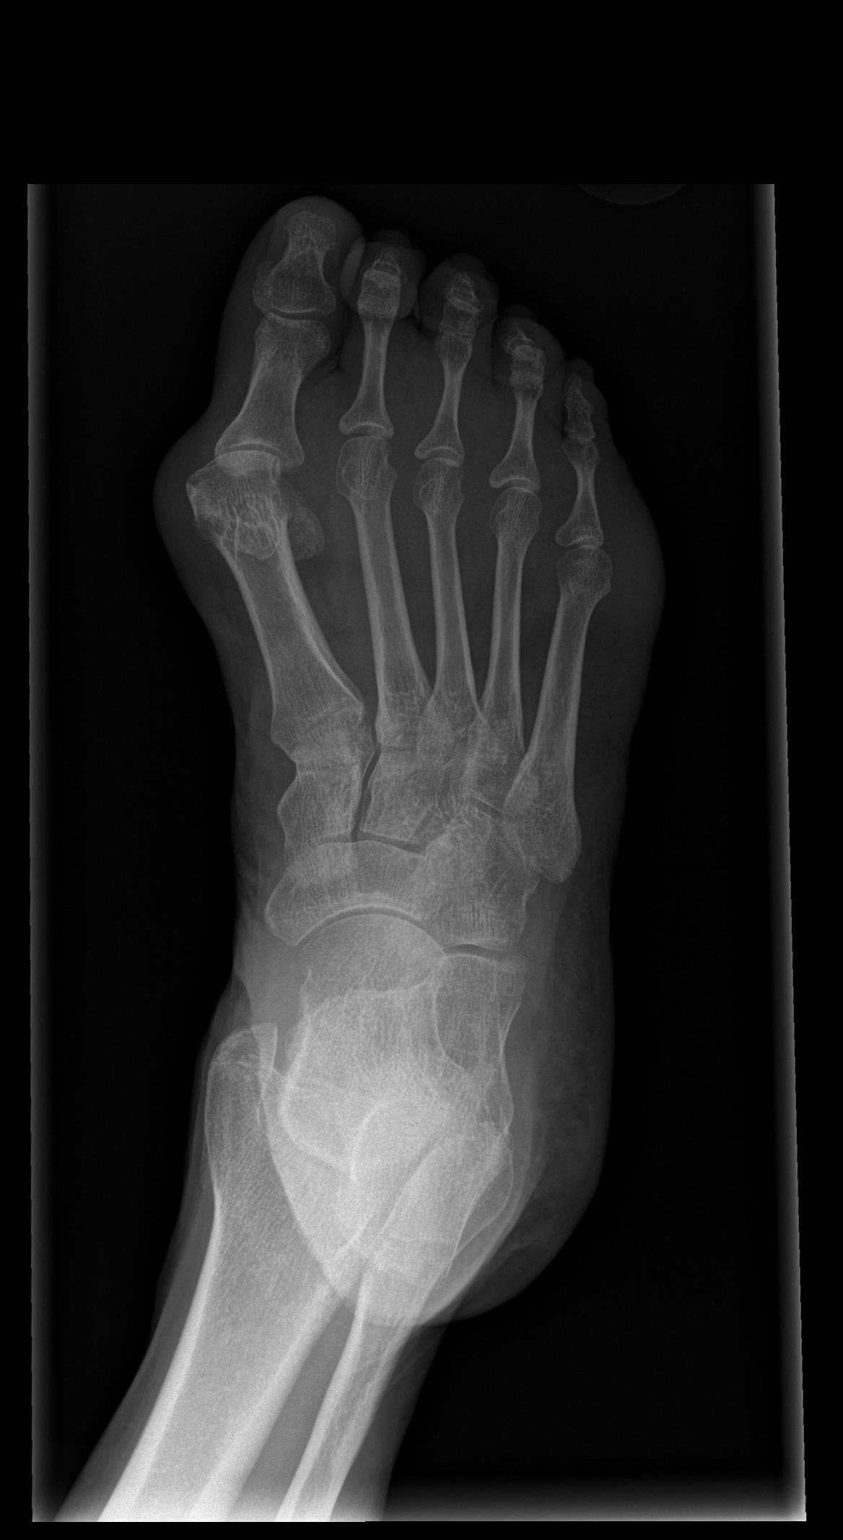

[x foot obl right]
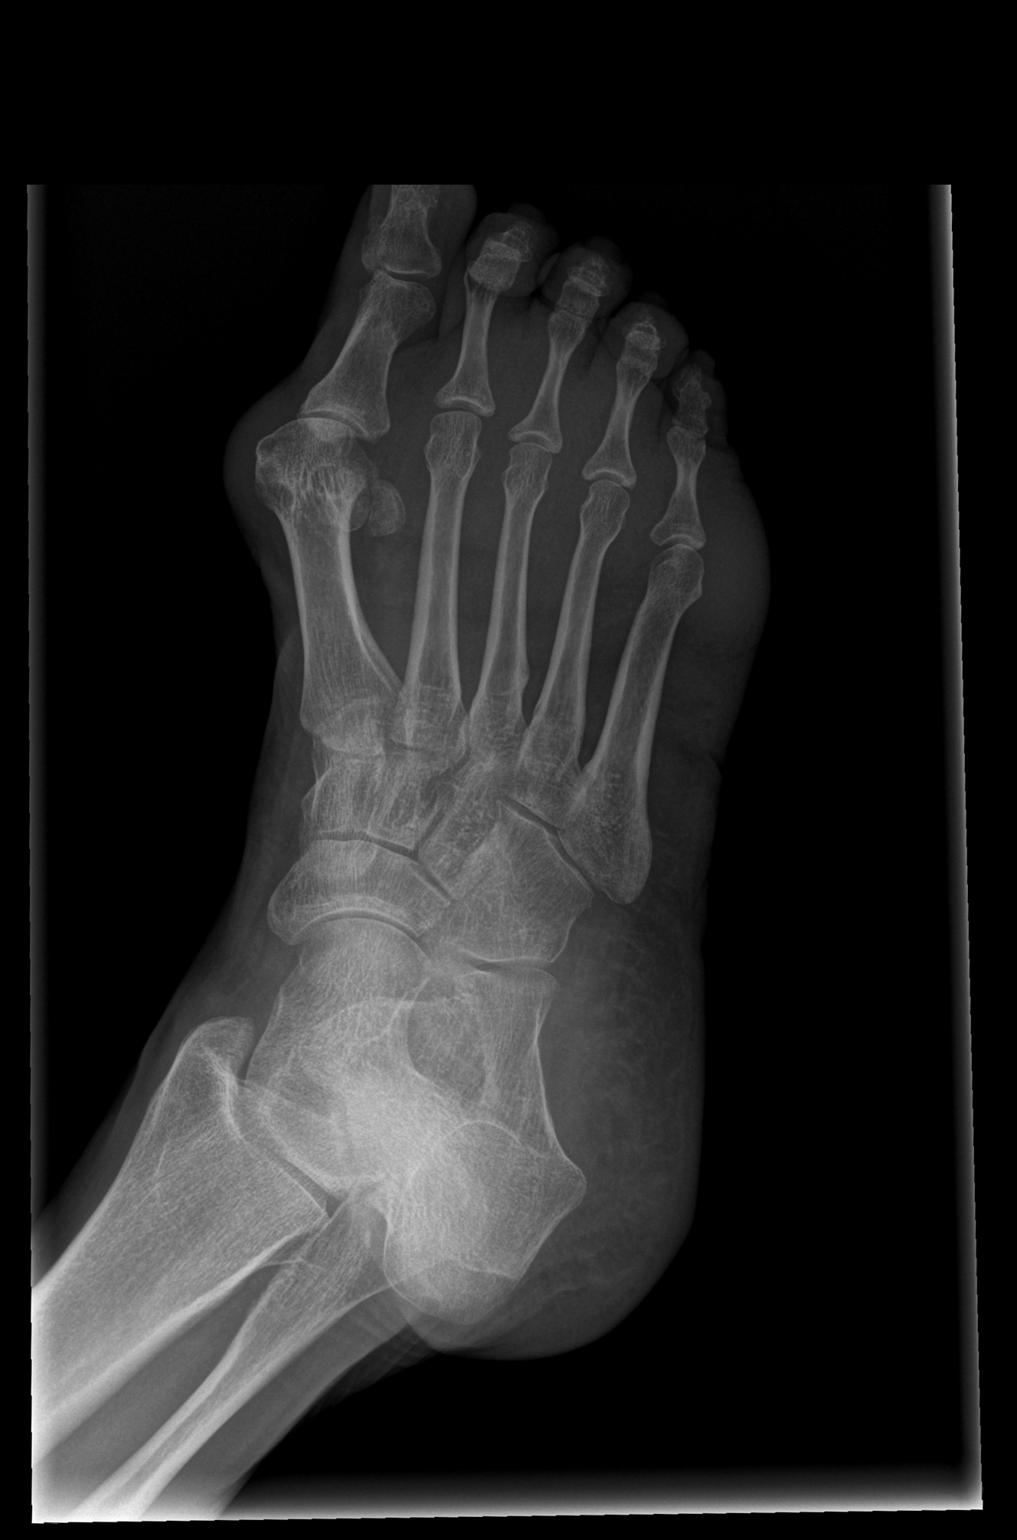

[x foot lat right]
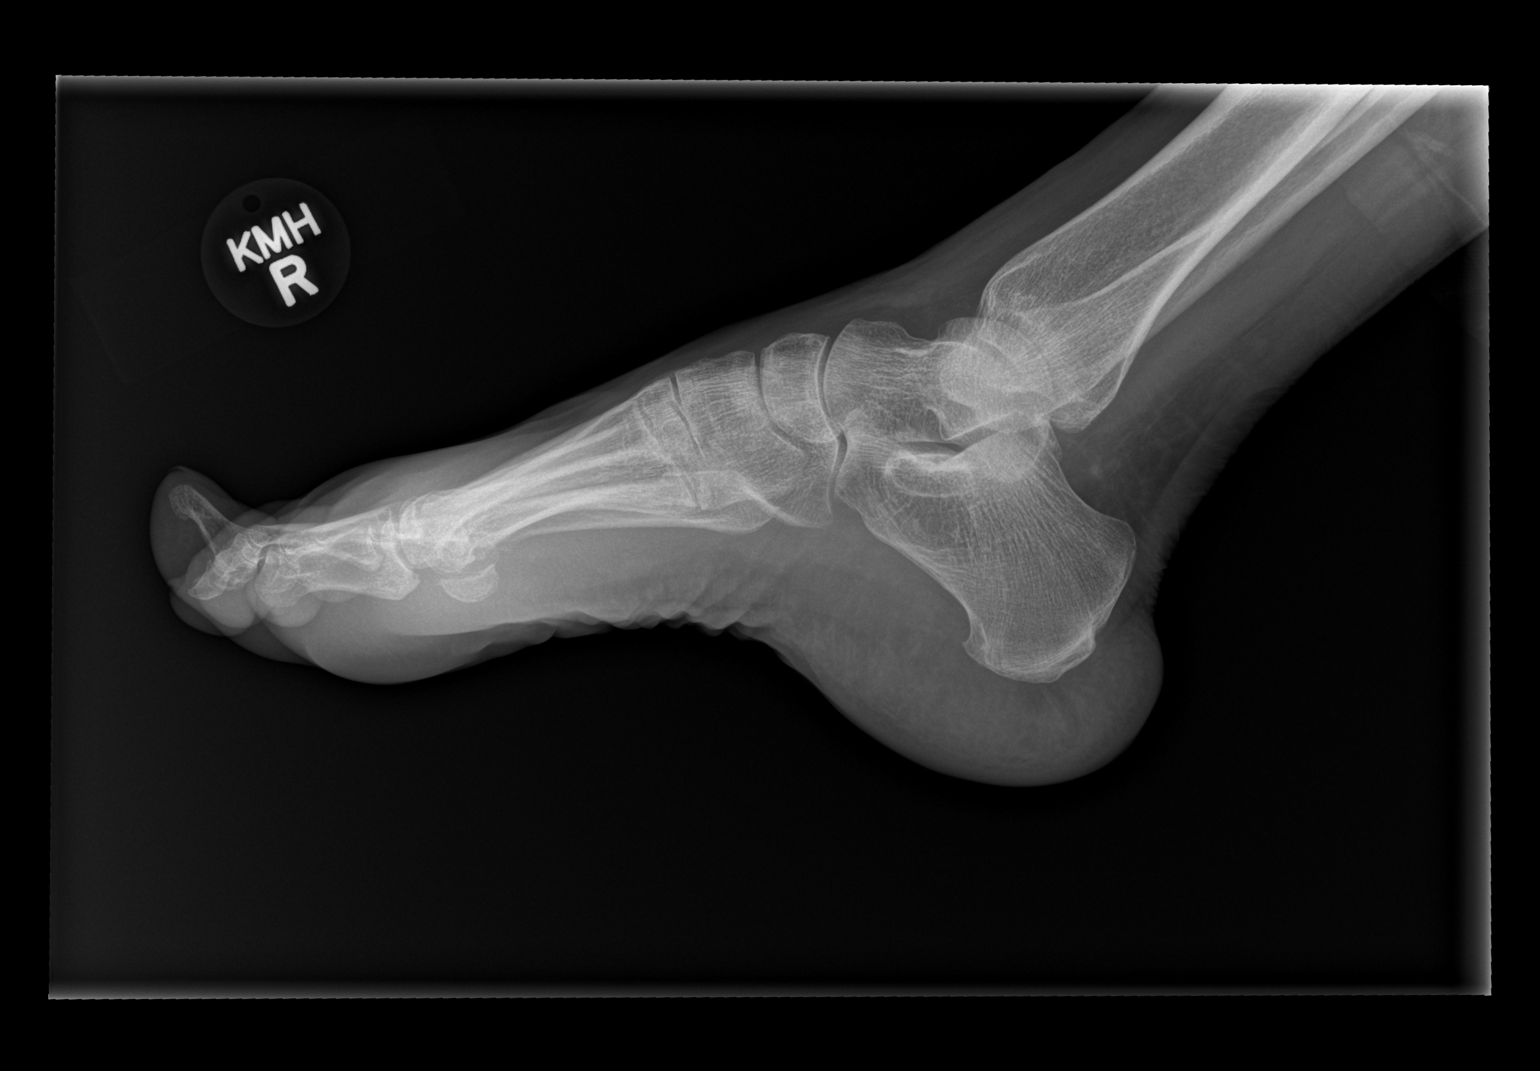

[3 of 3 positions shown; findings below may reference images not displayed]

FINDINGS: The bone demineralization.  Hallux valgus deformity.
Soft tissue swelling over the first and fifth metatarsal phalangeal
joints.  No evidence of acute fracture or subluxation.  No focal
bone lesion or bone destruction.  The cortex appears intact and
there is no periosteal reaction.  No radiopaque soft tissue foreign
bodies or gas collections.  Hallux valgus deformity is progressed
since the previous study and soft tissue swelling is increased.
IMPRESSION: Hallux valgus deformity.  Soft tissue swelling over the first and
fifth metatarsal phalangeal joints.  No acute fracture or
subluxation.

## 2015-04-07 ENCOUNTER — Observation Stay (HOSPITAL_COMMUNITY)
Admission: EM | Admit: 2015-04-07 | Discharge: 2015-04-08 | Disposition: A | Discharge status: Home / Self Care | Payer: Medicaid Other | Attending: Internal Medicine | Admitting: Internal Medicine

## 2015-04-07 ENCOUNTER — Encounter (HOSPITAL_COMMUNITY): Payer: Self-pay | Admitting: Emergency Medicine

## 2015-04-07 DIAGNOSIS — R21 Rash and other nonspecific skin eruption: Secondary | ICD-10-CM | POA: Insufficient documentation

## 2015-04-07 DIAGNOSIS — Z720 Tobacco use: Secondary | ICD-10-CM | POA: Insufficient documentation

## 2015-04-07 DIAGNOSIS — R0789 Other chest pain: Secondary | ICD-10-CM | POA: Diagnosis not present

## 2015-04-07 DIAGNOSIS — D649 Anemia, unspecified: Secondary | ICD-10-CM

## 2015-04-07 DIAGNOSIS — I7389 Other specified peripheral vascular diseases: Secondary | ICD-10-CM | POA: Diagnosis not present

## 2015-04-07 DIAGNOSIS — Z7982 Long term (current) use of aspirin: Secondary | ICD-10-CM | POA: Insufficient documentation

## 2015-04-07 DIAGNOSIS — Z79899 Other long term (current) drug therapy: Secondary | ICD-10-CM | POA: Diagnosis not present

## 2015-04-07 DIAGNOSIS — G43909 Migraine, unspecified, not intractable, without status migrainosus: Secondary | ICD-10-CM | POA: Insufficient documentation

## 2015-04-07 DIAGNOSIS — M069 Rheumatoid arthritis, unspecified: Secondary | ICD-10-CM | POA: Insufficient documentation

## 2015-04-07 DIAGNOSIS — F14988 Cocaine use, unspecified with other cocaine-induced disorder: Secondary | ICD-10-CM | POA: Diagnosis not present

## 2015-04-07 DIAGNOSIS — L97509 Non-pressure chronic ulcer of other part of unspecified foot with unspecified severity: Secondary | ICD-10-CM | POA: Diagnosis present

## 2015-04-07 DIAGNOSIS — R079 Chest pain, unspecified: Secondary | ICD-10-CM | POA: Diagnosis present

## 2015-04-07 DIAGNOSIS — Z8701 Personal history of pneumonia (recurrent): Secondary | ICD-10-CM | POA: Diagnosis not present

## 2015-04-07 DIAGNOSIS — F329 Major depressive disorder, single episode, unspecified: Secondary | ICD-10-CM | POA: Diagnosis not present

## 2015-04-07 DIAGNOSIS — F14288 Cocaine dependence with other cocaine-induced disorder: Secondary | ICD-10-CM | POA: Diagnosis not present

## 2015-04-07 DIAGNOSIS — L959 Vasculitis limited to the skin, unspecified: Secondary | ICD-10-CM

## 2015-04-07 DIAGNOSIS — L739 Follicular disorder, unspecified: Secondary | ICD-10-CM | POA: Diagnosis not present

## 2015-04-07 DIAGNOSIS — F1410 Cocaine abuse, uncomplicated: Secondary | ICD-10-CM | POA: Diagnosis present

## 2015-04-07 DIAGNOSIS — I10 Essential (primary) hypertension: Secondary | ICD-10-CM

## 2015-04-07 DIAGNOSIS — I999 Unspecified disorder of circulatory system: Secondary | ICD-10-CM

## 2015-04-07 LAB — CBC WITH DIFFERENTIAL/PLATELET
Basophils Absolute: 0 10*3/uL (ref 0.0–0.1)
Basophils Relative: 0 % (ref 0–1)
EOS ABS: 0.1 10*3/uL (ref 0.0–0.7)
EOS PCT: 2 % (ref 0–5)
HCT: 29 % — ABNORMAL LOW (ref 36.0–46.0)
HEMOGLOBIN: 9.7 g/dL — AB (ref 12.0–15.0)
LYMPHS ABS: 0.8 10*3/uL (ref 0.7–4.0)
Lymphocytes Relative: 30 % (ref 12–46)
MCH: 29.4 pg (ref 26.0–34.0)
MCHC: 33.4 g/dL (ref 30.0–36.0)
MCV: 87.9 fL (ref 78.0–100.0)
MONO ABS: 0.1 10*3/uL (ref 0.1–1.0)
MONOS PCT: 2 % — AB (ref 3–12)
NEUTROS ABS: 1.8 10*3/uL (ref 1.7–7.7)
NEUTROS PCT: 66 % (ref 43–77)
Platelets: 272 10*3/uL (ref 150–400)
RBC: 3.3 MIL/uL — ABNORMAL LOW (ref 3.87–5.11)
RDW: 13.5 % (ref 11.5–15.5)
WBC: 2.7 10*3/uL — ABNORMAL LOW (ref 4.0–10.5)

## 2015-04-07 LAB — COMPREHENSIVE METABOLIC PANEL
ALT: 11 U/L — ABNORMAL LOW (ref 14–54)
ANION GAP: 9 (ref 5–15)
AST: 17 U/L (ref 15–41)
Albumin: 2.9 g/dL — ABNORMAL LOW (ref 3.5–5.0)
Alkaline Phosphatase: 51 U/L (ref 38–126)
BILIRUBIN TOTAL: 0.2 mg/dL — AB (ref 0.3–1.2)
BUN: 14 mg/dL (ref 6–20)
CO2: 23 mmol/L (ref 22–32)
Calcium: 8.8 mg/dL — ABNORMAL LOW (ref 8.9–10.3)
Chloride: 107 mmol/L (ref 101–111)
Creatinine, Ser: 0.81 mg/dL (ref 0.44–1.00)
GFR calc Af Amer: >60 mL/min (ref >60)
GFR calc non Af Amer: >60 mL/min (ref >60)
Glucose, Bld: 106 mg/dL — ABNORMAL HIGH (ref 65–99)
POTASSIUM: 3.5 mmol/L (ref 3.5–5.1)
SODIUM: 139 mmol/L (ref 135–145)
Total Protein: 5.9 g/dL — ABNORMAL LOW (ref 6.5–8.1)

## 2015-04-07 LAB — RAPID URINE DRUG SCREEN, HOSP PERFORMED
Amphetamines: NOT DETECTED
BENZODIAZEPINES: NOT DETECTED
Barbiturates: NOT DETECTED
COCAINE: POSITIVE — AB
OPIATES: POSITIVE — AB
Tetrahydrocannabinol: NOT DETECTED

## 2015-04-07 LAB — I-STAT TROPONIN, ED: Troponin i, poc: 0.01 ng/mL (ref 0.00–0.08)

## 2015-04-07 MED ORDER — HYDROCODONE-ACETAMINOPHEN 5-325 MG PO TABS
1.0000 | ORAL_TABLET | ORAL | Status: DC | PRN
  Administered 2015-04-08: 2 via ORAL
  Filled 2015-04-07: qty 2

## 2015-04-07 MED ORDER — MORPHINE SULFATE 2 MG/ML IJ SOLN
2.0000 mg | Freq: Once | INTRAMUSCULAR | Status: AC
  Administered 2015-04-07: 2 mg via INTRAVENOUS
  Filled 2015-04-07: qty 1

## 2015-04-07 NOTE — ED Notes (Signed)
Pt states she is 10/10 pain on her left chest to her back, pt is crying and asking for some pain reliever, MD to be notified.

## 2015-04-07 NOTE — ED Notes (Signed)
Pt still c/o 10/10 pain and crying, pt requesting pain medication, MD to be notified.

## 2015-04-07 NOTE — ED Notes (Signed)
Pt woke up this morning and started having chest pain. Pt has rash under left arm into left arm pit. Denies SOB and nausea. Pain 9/10. BP 148/75, HR90 sinus rhythm, Pt has taken 371m ASA

## 2015-04-07 NOTE — H&P (Signed)
Date: 04/08/2015               Patient Name:  [name] MRN: [id number]  DOB: 07/13/1961 Age / Sex: 54 y.o., female   PCP: No primary care provider on file.         Medical Service: Internal Medicine Teaching Service         Attending Physician: Dr. Oval Linsey, MD    First Contact: Dr. Randell Patient Pager: [phone]  Second Contact: Dr. Ronnald Ramp Pager: [phone]       After Hours (After 5p/  First Contact Pager: [phone]  weekends / holidays): Second Contact Pager: [phone]   Chief Complaint: chest pain, left axilla drainage/pain  History of Present Illness:   54 yo female with cocaine abuse and Levamisole nduced vasculitis, HTN, depression, here with left axilla rash with drainage and also chest pain. She noticed the rash 3 days ago, noticed some yellow crusted drainage in the shower today. Also having significant pain around this area. Denies any fever/chills.  Also having chest pain, sharp, feels like could be coming from her axila, starting this morning while just sitting, does not radiate anywhere else. Does not change with movement or touching the chest. States it has almost resolved when we interviewed her. Denies SOB, n/v, dysphagia.   Has been admited in the past with ulcers on foot, ear, nose, and fingers. Was treated just recently with keflex. Goes to wound care outpatient for her leg wounds. Was counseled several times in the past about cocaine abstinence, however, continues to use cocaine. Last used 2 days ago.   Meds: Current Facility-Administered Medications  Medication Dose Route Frequency Provider Last Rate Last Dose  . doxycycline (VIBRA-TABS) tablet 100 mg  100 mg Oral Q12H Sang Blount, MD   100 mg at 04/08/15 0039  . HYDROcodone-acetaminophen (NORCO/VICODIN) 5-325 MG per tablet 1-2 tablet  1-2 tablet Oral Q4H PRN Dellia Nims, MD   2 tablet at 04/08/15 0003  . lisinopril (PRINIVIL,ZESTRIL) tablet 10 mg  10 mg Oral Daily Dellia Nims, MD       Current Outpatient  Prescriptions  Medication Sig Dispense Refill  . buPROPion (WELLBUTRIN XL) 150 MG 24 hr tablet Take 1 tablet (150 mg total) by mouth every morning. 30 tablet 2  . HYDROcodone-ibuprofen (VICOPROFEN) 7.5-200 MG per tablet Take 1 tablet by mouth every 8 (eight) hours as needed for moderate pain.   0  . lisinopril (PRINIVIL,ZESTRIL) 10 MG tablet Take 1 tablet (10 mg total) by mouth daily. 30 tablet 0  . naproxen (NAPROSYN) 500 MG tablet Take 1 tablet (500 mg total) by mouth 2 (two) times daily with a meal. 60 tablet 2  . traZODone (DESYREL) 50 MG tablet Take 1 tablet (50 mg total) by mouth at bedtime and may repeat dose one time if needed. 30 tablet 0  . cephALEXin (KEFLEX) 500 MG capsule Take 1 capsule (500 mg total) by mouth every 12 (twelve) hours. Starting 03/11/15 evening (Patient not taking: Reported on 04/07/2015) 17 capsule 0    Allergies: Allergies as of 04/07/2015 - Review Complete 04/07/2015  Allergen Reaction Noted  . Acetaminophen Swelling    Past Medical History  Diagnosis Date  . 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 Followed by Dr. Louanne Skye  . Hypertension   . Cocaine abuse     ongoing with resultant vaculitis.  . Depression   . Normocytic anemia     BL Hgb 9.8-12. Last anemia panel 04/2010 - showing Fe 19,  ferritin 101.  Pt on monthly B12 injections  . 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 CAP (community acquired pneumonia) 03/2014 X 2  . Daily headache   . Migraines     "probably 5-6/yr" (04/21/2014)  . Rheumatoid arthritis(714.0)     patient reported  . Inflammatory arthritis    Past Surgical History  Procedure Laterality Date  . Skin biopsy Bilateral 04/2010    shin nodules  . Irrigation and debridement abscess Bilateral 09/26/2013    Procedure: DEBRIDEMENT ULCERS BILATERAL THIGHS;  Surgeon: Gwenyth Ober, MD;  Location: [facility];  Service: General;  Laterality: Bilateral;  . Hernia repair      "stomach"   Family History  Problem  Relation Age of Onset  . Breast cancer Mother     Breast cancer  . Alcohol abuse Mother   . Colon cancer Maternal Aunt 13  . Alcohol abuse Father    History   Social History  . Marital Status: Single    Spouse Name: N/A  . Number of Children: 0  . Years of Education: 11th grade   Occupational History  . Disability     since 2011, due to her rheumatoid arthritis   Social History Main Topics  . Smoking status: Current Every Day Smoker -- 0.30 packs/day for 34 years    Types: Cigarettes  . Smokeless tobacco: Never Used     Comment: "Trying very hard".  . Alcohol Use: No  . Drug Use: Yes    Special: "Crack" cocaine     Comment: States she still uses crack "a little bit".  . Sexual Activity: Not on file   Other Topics Concern  . Not on file   Social History Narrative   Unemployed:  cleaning in past   Living at [place]; has Medicaid.   crack/cocaine use; pt denies IVDU   tobacco:  1/2 ppd, trying to quit   alcohol:  none        Review of Systems: Review of Systems  Constitutional: Negative for fever, chills and diaphoresis.  HENT: Negative for sore throat.   Eyes: Negative for blurred vision, double vision and redness.  Respiratory: Negative for cough, hemoptysis, sputum production, shortness of breath and wheezing.   Cardiovascular: Positive for chest pain. Negative for palpitations, orthopnea, leg swelling and PND.  Gastrointestinal: Negative for heartburn, nausea, vomiting, abdominal pain and diarrhea.  Genitourinary: Negative for dysuria and hematuria.  Musculoskeletal: Negative for myalgias, joint pain and neck pain.  Skin: Positive for rash.       Has rash and drainage on the left axilla. And also chronic wound on both legs and ears.   Neurological: Negative for dizziness, tingling, sensory change, seizures, loss of consciousness, weakness and headaches.     Physical Exam: Blood pressure 147/77, pulse 71, temperature 98.2 F (36.8 C), temperature  source Oral, resp. rate 19, height 5' 1"  (1.549 m), weight 100 lb (45.36 kg), SpO2 100 %. Physical Exam  Constitutional: She is oriented to person, place, and time. Vital signs are normal. She appears cachectic. She is cooperative.  HENT:  Head: Normocephalic and atraumatic.  Previous vasculitis changes on both ears and nose.  Eyes: Conjunctivae are normal. Pupils are equal, round, and reactive to light. No scleral icterus.  Neck: Normal range of motion.  Cardiovascular: Normal rate, regular rhythm and normal heart sounds.  Exam reveals no gallop and no friction rub.   No murmur heard.  Respiratory: Effort normal and breath sounds normal. No stridor. No respiratory distress. She has no wheezes. She has no rales. She exhibits no tenderness.  GI: Soft. Bowel sounds are normal. She exhibits no distension. There is no tenderness. There is no rebound.  Musculoskeletal: Normal range of motion.  Neurological: She is alert and oriented to person, place, and time. No cranial nerve deficit. Coordination normal.  Skin: Rash noted.  Has multiple 2-77m rounded erosions, with yellow crusted drainage under the left armpit area. Has tenderness to palpation.   Also has healed ulcers on the ears, nose, purpura on both legs.  Has previous vasculitis ulcer on feet.   Psychiatric: She has a normal mood and affect.     Lab results: Basic Metabolic Panel:  Recent Labs  04/07/15 1931  NA 139  K 3.5  CL 107  CO2 23  GLUCOSE 106*  BUN 14  CREATININE 0.81  CALCIUM 8.8*   Liver Function Tests:  Recent Labs  04/07/15 1931  AST 17  ALT 11*  ALKPHOS 51  BILITOT 0.2*  PROT 5.9*  ALBUMIN 2.9*  CBC:  Recent Labs  04/07/15 1931  WBC 2.7*  NEUTROABS 1.8  HGB 9.7*  HCT 29.0*  MCV 87.9  PLT 272  Urine Drug Screen: Drugs of Abuse     Component Value Date/Time   LABOPIA POSITIVE* 04/07/2015 2302   LABOPIA PPS 08/31/2012 1045   COCAINSCRNUR POSITIVE* 04/07/2015 2302   COCAINSCRNUR NEG  08/31/2012 1045   LABBENZ NONE DETECTED 04/07/2015 2302   LABBENZ NEG 08/31/2012 1045   LABBENZ NEGATIVE 06/12/2011 0221   AMPHETMU NONE DETECTED 04/07/2015 2302   AMPHETMU NEG 08/31/2012 1045   AMPHETMU NEGATIVE 06/12/2011 0221   THCU NONE DETECTED 04/07/2015 2302   THCU NEG 08/31/2012 1045   THCU 63.4* 05/20/2012 1405   LABBARB NONE DETECTED 04/07/2015 2302   LABBARB NEG 08/31/2012 1045    Misc. Labs:  Imaging results:  No results found.  Other results: EKG: has new TWI on V4-V6, has previous TWI on V1-V3. LVH. TW changes could be from LVH.  Assessment & Plan by Problem: Active Problems:   Essential hypertension   Cocaine-induced vascular disorder   Major depression, recurrent   Foot ulceration   Cocaine abuse   Chest pain   54yo female with cocaine abuse, recurrent Levamisole induced vasculitis, here with chest pain and left axilla rash.  Atypical Chest pain - started this morning, at rest, without radiation, feels like could be coming from her axilla. Chest pain has resolved now.   Likely 2/2 to intermittent ischemia/ vasospasms from cocaine abuse. UDS+ for cocaine, does have TWI's on V4-V6 which are new. Could also be referred pain from her rash/vasculitic lesions on the left axilla - repeat EKG - cycle trops (first one negative).  - prn oxygen - give asa full once, then daily 821m - monitor on tele - counseled on cocaine abstinence.   Left axilla rash - likely infected vasculitis lesion started 3 days ago, has yellow crusted drainage, likely infected with Staph. No fever or systemic signs of infection - will treat with Doxy - hydrocodone for pain. - wound care consulted. - UDS positive for cocaine. Counseled on abstinence  Chronic normocytic anemia - likely b/l 9-11. Now at baseline 9.7 - check anemia panel. Repeat CBC  Depression - cont bupropion.   HTN - on lisinopril. Cont this.  Cocaine abuse -counseled on quitting.  Dispo: Disposition is deferred  at this time, awaiting improvement  of current medical problems. Anticipated discharge in approximately 1-2 day(s).   The patient does have a current PCP (No primary care provider on file.) and does need an [facility] hospital follow-up appointment after discharge.  The patient does have transportation limitations that hinder transportation to clinic appointments.  Signed: Dellia Nims, MD 04/08/2015, 12:51 AM

## 2015-04-07 NOTE — ED Provider Notes (Signed)
CSN: [id number]     Arrival date & time 04/07/15  1814 History   First MD Initiated Contact with Patient 04/07/15 1816     Chief Complaint  Patient presents with  . Chest Pain      HPI Pt woke up this morning and started having chest pain. Pt has rash under left arm into left arm pit. Denies SOB and nausea. Pain 9/10. BP 148/75, HR90 sinus rhythm, Pt has taken 360m ASA Past Medical History  Diagnosis Date  . 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 Followed by Dr. NLouanne Skye . Hypertension   . Cocaine abuse     ongoing with resultant vaculitis.  . Depression   . Normocytic anemia     BL Hgb 9.8-12. Last anemia panel 04/2010 - showing Fe 19, ferritin 101.  Pt on monthly B12 injections  . 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 CAP (community acquired pneumonia) 03/2014 X 2  . Daily headache   . Migraines     "probably 5-6/yr" (04/21/2014)  . Rheumatoid arthritis(714.0)     patient reported  . Inflammatory arthritis    Past Surgical History  Procedure Laterality Date  . Skin biopsy Bilateral 04/2010    shin nodules  . Irrigation and debridement abscess Bilateral 09/26/2013    Procedure: DEBRIDEMENT ULCERS BILATERAL THIGHS;  Surgeon: JGwenyth Ober MD;  Location: [facility]  Service: General;  Laterality: Bilateral;  . Hernia repair      "stomach"   Family History  Problem Relation Age of Onset  . Breast cancer Mother     Breast cancer  . Alcohol abuse Mother   . Colon cancer Maternal Aunt 539 . Alcohol abuse Father    History  Substance Use Topics  . Smoking status: Current Every Day Smoker -- 0.30 packs/day for 34 years    Types: Cigarettes  . Smokeless tobacco: Never Used     Comment: "Trying very hard".  . Alcohol Use: No   OB History    No data available     Review of Systems  All other systems reviewed and are negative  Allergies  Acetaminophen  Home Medications   Prior to Admission medications   Medication Sig Start Date  End Date Taking? Authorizing Provider  buPROPion (WELLBUTRIN XL) 150 MG 24 hr tablet Take 1 tablet (150 mg total) by mouth every morning. 03/12/15 03/11/16 Yes JKarlene Einstein MD  HYDROcodone-ibuprofen (VICOPROFEN) 7.5-200 MG per tablet Take 1 tablet by mouth every 8 (eight) hours as needed for moderate pain.  03/12/15  Yes Historical Provider, MD  lisinopril (PRINIVIL,ZESTRIL) 10 MG tablet Take 1 tablet (10 mg total) by mouth daily. 03/11/15  Yes AKelby Aline MD  traZODone (DESYREL) 50 MG tablet Take 1 tablet (50 mg total) by mouth at bedtime and may repeat dose one time if needed. 03/15/15  Yes ELangley GaussModing, MD  aspirin EC 81 MG EC tablet Take 1 tablet (81 mg total) by mouth daily. 04/08/15   JMilagros Loll MD  naproxen (NAPROSYN) 500 MG tablet Take 1 tablet (500 mg total) by mouth 2 (two) times daily with a meal. 04/08/15   JMilagros Loll MD   BP 143/62 mmHg  Pulse 65  Temp(Src) 98.5 F (36.9 C) (Oral)  Resp 16  Ht 5' 1.5" (1.562 m)  Wt 89 lb 14.4 oz (40.778 kg)  BMI 16.71 kg/m2  SpO2 100% Physical Exam Physical Exam  Nursing note and vitals reviewed. Constitutional: She is oriented to person, place, and time. She appears well-developed and well-nourished. No distress.  HENT:  Head: Normocephalic and atraumatic.  Eyes: Pupils are equal, round, and reactive to light.  Neck: Normal range of motion.  Cardiovascular: Normal rate and intact distal pulses.   Pulmonary/Chest: No respiratory distress.  Abdominal: Normal appearance. She exhibits no distension.  Musculoskeletal: Normal range of motion.  Neurological: She is alert and oriented to person, place, and time. No cranial nerve deficit.  Skin: Skin is warm and dry. No rash noted.  Psychiatric: She has a normal mood and affect. Her behavior is normal.   ED Course  Procedures (including critical care time) Labs Review Labs Reviewed  CBC WITH DIFFERENTIAL/PLATELET - Abnormal; Notable for the following:    WBC 2.7 (*)    RBC  3.30 (*)    Hemoglobin 9.7 (*)    HCT 29.0 (*)    Monocytes Relative 2 (*)    All other components within normal limits  COMPREHENSIVE METABOLIC PANEL - Abnormal; Notable for the following:    Glucose, Bld 106 (*)    Calcium 8.8 (*)    Total Protein 5.9 (*)    Albumin 2.9 (*)    ALT 11 (*)    Total Bilirubin 0.2 (*)    All other components within normal limits  URINE RAPID DRUG SCREEN, HOSP PERFORMED - Abnormal; Notable for the following:    Opiates POSITIVE (*)    Cocaine POSITIVE (*)    All other components within normal limits  IRON AND TIBC - Abnormal; Notable for the following:    Iron 16 (*)    TIBC 232 (*)    Saturation Ratios 7 (*)    All other components within normal limits  RETICULOCYTES - Abnormal; Notable for the following:    RBC. 3.44 (*)    All other components within normal limits  BASIC METABOLIC PANEL - Abnormal; Notable for the following:    Calcium 8.8 (*)    All other components within normal limits  CBC - Abnormal; Notable for the following:    WBC 2.7 (*)    RBC 3.36 (*)    Hemoglobin 9.9 (*)    HCT 29.3 (*)    All other components within normal limits  VITAMIN B12  FOLATE  FERRITIN  METHYLMALONIC ACID, SERUM  HIV ANTIBODY (ROUTINE TESTING)  TROPONIN I  I-STAT TROPOININ, ED    Imaging Review No results found.   EKG Interpretation   Date/Time:  Sunday April 07 2015 18:25:50 EDT Ventricular Rate:  79 PR Interval:  133 QRS Duration: 86 QT Interval:  468 QTC Calculation: 537 R Axis:   83 Text Interpretation:  Sinus rhythm Biatrial enlargement Left ventricular  hypertrophy Abnormal T, consider ischemia, anterior leads Prolonged QT  interval No significant change since last tracing Confirmed by Rhoda Waldvogel  MD,  Kylena Mole ([id number]) on 04/07/2015 6:58:23 PM      MDM   Final diagnoses:  Chest pain  Cocaine-induced vascular disorder        Leonard Schwartz, MD 04/21/15 0800

## 2015-04-08 ENCOUNTER — Observation Stay (HOSPITAL_COMMUNITY): Payer: Medicaid Other

## 2015-04-08 DIAGNOSIS — R0789 Other chest pain: Secondary | ICD-10-CM | POA: Diagnosis not present

## 2015-04-08 DIAGNOSIS — I7389 Other specified peripheral vascular diseases: Secondary | ICD-10-CM | POA: Diagnosis not present

## 2015-04-08 DIAGNOSIS — F14288 Cocaine dependence with other cocaine-induced disorder: Secondary | ICD-10-CM | POA: Diagnosis not present

## 2015-04-08 DIAGNOSIS — M069 Rheumatoid arthritis, unspecified: Secondary | ICD-10-CM | POA: Diagnosis not present

## 2015-04-08 DIAGNOSIS — R079 Chest pain, unspecified: Secondary | ICD-10-CM | POA: Diagnosis not present

## 2015-04-08 DIAGNOSIS — F14988 Cocaine use, unspecified with other cocaine-induced disorder: Secondary | ICD-10-CM | POA: Diagnosis not present

## 2015-04-08 DIAGNOSIS — L739 Follicular disorder, unspecified: Secondary | ICD-10-CM | POA: Diagnosis not present

## 2015-04-08 LAB — RETICULOCYTES
RBC.: 3.44 MIL/uL — AB (ref 3.87–5.11)
Retic Count, Absolute: 37.8 10*3/uL (ref 19.0–186.0)
Retic Ct Pct: 1.1 % (ref 0.4–3.1)

## 2015-04-08 LAB — IRON AND TIBC
Iron: 16 ug/dL — ABNORMAL LOW (ref 28–170)
SATURATION RATIOS: 7 % — AB (ref 10.4–31.8)
TIBC: 232 ug/dL — ABNORMAL LOW (ref 250–450)
UIBC: 216 ug/dL

## 2015-04-08 LAB — CBC
HCT: 29.3 % — ABNORMAL LOW (ref 36.0–46.0)
Hemoglobin: 9.9 g/dL — ABNORMAL LOW (ref 12.0–15.0)
MCH: 29.5 pg (ref 26.0–34.0)
MCHC: 33.8 g/dL (ref 30.0–36.0)
MCV: 87.2 fL (ref 78.0–100.0)
PLATELETS: 243 10*3/uL (ref 150–400)
RBC: 3.36 MIL/uL — AB (ref 3.87–5.11)
RDW: 13.5 % (ref 11.5–15.5)
WBC: 2.7 10*3/uL — ABNORMAL LOW (ref 4.0–10.5)

## 2015-04-08 LAB — BASIC METABOLIC PANEL
Anion gap: 6 (ref 5–15)
BUN: 16 mg/dL (ref 6–20)
CALCIUM: 8.8 mg/dL — AB (ref 8.9–10.3)
CO2: 25 mmol/L (ref 22–32)
Chloride: 110 mmol/L (ref 101–111)
Creatinine, Ser: 0.72 mg/dL (ref 0.44–1.00)
GLUCOSE: 91 mg/dL (ref 65–99)
Potassium: 3.6 mmol/L (ref 3.5–5.1)
Sodium: 141 mmol/L (ref 135–145)

## 2015-04-08 LAB — FOLATE: Folate: 10.1 ng/mL (ref >5.9)

## 2015-04-08 LAB — FERRITIN: FERRITIN: 34 ng/mL (ref 11–307)

## 2015-04-08 LAB — TROPONIN I: Troponin I: <0.03 ng/mL (ref <0.031)

## 2015-04-08 LAB — VITAMIN B12: Vitamin B-12: 245 pg/mL (ref 180–914)

## 2015-04-08 MED ORDER — MORPHINE SULFATE 2 MG/ML IJ SOLN
2.0000 mg | INTRAMUSCULAR | Status: DC | PRN
  Administered 2015-04-08: 2 mg via INTRAVENOUS
  Filled 2015-04-08: qty 1

## 2015-04-08 MED ORDER — ASPIRIN 325 MG PO TABS
325.0000 mg | ORAL_TABLET | Freq: Once | ORAL | Status: AC
  Administered 2015-04-08: 325 mg via ORAL
  Filled 2015-04-08: qty 1

## 2015-04-08 MED ORDER — ASPIRIN 81 MG PO TBEC: 81.0000 mg | DELAYED_RELEASE_TABLET | Freq: Every day | ORAL | Status: DC

## 2015-04-08 MED ORDER — SODIUM CHLORIDE 0.9 % IV SOLN
INTRAVENOUS | Status: DC
  Administered 2015-04-08: 02:00:00 via INTRAVENOUS

## 2015-04-08 MED ORDER — DOXYCYCLINE HYCLATE 100 MG PO TABS: 100.0000 mg | ORAL_TABLET | Freq: Two times a day (BID) | ORAL | Status: AC

## 2015-04-08 MED ORDER — HYDROCODONE-IBUPROFEN 7.5-200 MG PO TABS: 1.0000 | ORAL_TABLET | Freq: Four times a day (QID) | ORAL | Status: DC | PRN

## 2015-04-08 MED ORDER — OXYCODONE HCL 5 MG PO TABS
5.0000 mg | ORAL_TABLET | ORAL | Status: DC | PRN
  Administered 2015-04-08: 5 mg via ORAL
  Filled 2015-04-08: qty 1

## 2015-04-08 MED ORDER — BUPROPION HCL ER (XL) 150 MG PO TB24
150.0000 mg | ORAL_TABLET | Freq: Every day | ORAL | Status: DC
  Administered 2015-04-08: 150 mg via ORAL
  Filled 2015-04-08: qty 1

## 2015-04-08 MED ORDER — HYDROCODONE-IBUPROFEN 7.5-200 MG PO TABS: 1.0000 | ORAL_TABLET | Freq: Three times a day (TID) | ORAL | Status: DC | PRN

## 2015-04-08 MED ORDER — ASPIRIN EC 81 MG PO TBEC
81.0000 mg | DELAYED_RELEASE_TABLET | Freq: Every day | ORAL | Status: DC
  Administered 2015-04-08: 81 mg via ORAL
  Filled 2015-04-08: qty 1

## 2015-04-08 MED ORDER — BOOST / RESOURCE BREEZE PO LIQD: 1.0000 | Freq: Three times a day (TID) | ORAL | Status: DC

## 2015-04-08 MED ORDER — DIPHENHYDRAMINE HCL 50 MG/ML IJ SOLN
50.0000 mg | Freq: Once | INTRAMUSCULAR | Status: AC
  Administered 2015-04-08: 50 mg via INTRAVENOUS
  Filled 2015-04-08: qty 1

## 2015-04-08 MED ORDER — ENOXAPARIN SODIUM 30 MG/0.3ML ~~LOC~~ SOLN
30.0000 mg | Freq: Every day | SUBCUTANEOUS | Status: DC
  Administered 2015-04-08: 30 mg via SUBCUTANEOUS
  Filled 2015-04-08: qty 0.3

## 2015-04-08 MED ORDER — LISINOPRIL 10 MG PO TABS
10.0000 mg | ORAL_TABLET | Freq: Every day | ORAL | Status: DC
  Administered 2015-04-08: 10 mg via ORAL
  Filled 2015-04-08: qty 1

## 2015-04-08 MED ORDER — DOXYCYCLINE HYCLATE 100 MG PO TABS
100.0000 mg | ORAL_TABLET | Freq: Two times a day (BID) | ORAL | Status: DC
  Administered 2015-04-08 (×2): 100 mg via ORAL
  Filled 2015-04-08 (×2): qty 1

## 2015-04-08 MED ORDER — NAPROXEN 500 MG PO TABS: 500.0000 mg | ORAL_TABLET | Freq: Two times a day (BID) | ORAL | Status: DC

## 2015-04-08 NOTE — Progress Notes (Addendum)
MD notified by RN that patient received 1 Hydrocodone-APAP, though patient has acetaminophen allergy.  She reports the patient is complaining of itch and swelling of the face and eyes.  Patient endorses eye and forehead swelling, but denies trouble breathing.  She also endorses itching of the chest, arms, and back.  She reports similar reactions to acetaminophen in the past, but denies ever having a reaction so bad she had trouble breathing.    PE: Filed Vitals:   04/08/15 0030 04/08/15 0058 04/08/15 0059 04/08/15 0100  BP: 147/77  145/72   Pulse: 71  72   Temp:    97.7 F (36.5 C)  TempSrc:    Oral  Resp: 19     Height:  5' 1.5" (1.562 m)    Weight:  89 lb 14.4 oz (40.778 kg)    SpO2: 100%  100%    General: Lying in bed in NAD HEENT: Minimal edema surrounding eyes.  No edema appreciated of forehead or lips.  No swelling of tongue.  Oropharynx could not be visualized. Pulm: Lungs CTAB. No upper airway sounds Skin: No urticarial lesions visualized  A/P: Allergic reaction: Patient's documented allergy reaction is eye lid swelling. Patient denies episodes of trouble breathing in past.  Patient counseled and understands to notify nurse at first sign of dyspnea. - Benadryl 50 mg IV once.

## 2015-04-08 NOTE — ED Notes (Signed)
Rapid Respond notified for reassessment on pt rash to be able to transfer pt to the floor.

## 2015-04-08 NOTE — Discharge Summary (Signed)
Name: [name] MRN: [id number] DOB: May 05, 1961 54 y.o. PCP: No primary care provider on file.  Date of Admission: 04/07/2015  6:14 PM Date of Discharge: 04/08/2015 Attending Physician: Oval Linsey, MD  Discharge Diagnosis: Principal Problem:   Chest pain Active Problems:   Cocaine-induced vascular disorder   Foot ulceration   Cocaine abuse  Discharge Medications:   Medication List    STOP taking these medications        cephALEXin 500 MG capsule  Commonly known as:  KEFLEX      TAKE these medications        aspirin 81 MG EC tablet  Take 1 tablet (81 mg total) by mouth daily.     buPROPion 150 MG 24 hr tablet  Commonly known as:  WELLBUTRIN XL  Take 1 tablet (150 mg total) by mouth every morning.     doxycycline 100 MG tablet  Commonly known as:  VIBRA-TABS  Take 1 tablet (100 mg total) by mouth every 12 (twelve) hours.     HYDROcodone-ibuprofen 7.5-200 MG per tablet  Commonly known as:  VICOPROFEN  Take 1 tablet by mouth every 8 (eight) hours as needed for moderate pain.     lisinopril 10 MG tablet  Commonly known as:  PRINIVIL,ZESTRIL  Take 1 tablet (10 mg total) by mouth daily.     naproxen 500 MG tablet  Commonly known as:  NAPROSYN  Take 1 tablet (500 mg total) by mouth 2 (two) times daily with a meal.     traZODone 50 MG tablet  Commonly known as:  DESYREL  Take 1 tablet (50 mg total) by mouth at bedtime and may repeat dose one time if needed.        Disposition and follow-up:   [name] was discharged from [facility] in Stable condition.  At the hospital follow up visit please address:  1.  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 She was counseled on cocaine abstinence.   Folliculitis of left axilla: She was placed on Doxy 100 mg every 12 hours for total 7 days of antibiotics (end date : 04/14/2015).   2.  Labs / imaging needed at time of follow-up: None  3.  Pending labs/ test  needing follow-up: None  Follow-up Appointments:     Follow-up Information    Follow up with [facility]. Schedule an appointment as soon as possible for a visit in 1 week.   Why:  Hospital follow up   Contact information:   [address] [place] [phone]      Discharge Instructions: Discharge Instructions    Call MD for:  difficulty breathing, headache or visual disturbances    Complete by:  As directed      Call MD for:  persistant dizziness or light-headedness    Complete by:  As directed      Call MD for:  persistant nausea and vomiting    Complete by:  As directed      Call MD for:  severe uncontrolled pain    Complete by:  As directed      Call MD for:  temperature >100.4    Complete by:  As directed      Diet - low sodium heart healthy    Complete by:  As directed      Increase activity slowly    Complete by:  As directed  Consultations: WOC  Procedures Performed:  Mr Foot Left Wo Contrast  03/09/2015   CLINICAL DATA:  Diabetic foot ulcers.  EXAM: MRI OF THE LEFT FOREFOOT WITHOUT CONTRAST  TECHNIQUE: Multiplanar, multisequence MR imaging was performed. No intravenous contrast was administered.  COMPARISON:  Radiograph 03/09/2015  FINDINGS: There are mild degenerative changes involving the forefoot most notably at the first metatarsal phalangeal joint with a mild hallux valgus deformity. No findings suspicious for osteomyelitis or septic arthritis. Mild cellulitis and myositis. No focal soft tissue abscess or findings for pyomyositis. The lesion involving the proximal phalanx of the big toe is more likely under erosion and could be secondary to gout.  IMPRESSION: No MR findings suspicious for septic arthritis or osteomyelitis.  Mild cellulitis and myositis but no focal drainable soft tissue abscess.   Electronically Signed   By: Marijo Sanes M.D.   On: 03/09/2015 15:53   Dg Chest Port 1 View  04/08/2015    CLINICAL DATA:  Acute onset of left-sided chest pain yesterday. Current history of hypertension. Nonsmoker.  EXAM: PORTABLE CHEST - 1 VIEW  COMPARISON:  10/09/2014 and earlier.  FINDINGS: Cardiac silhouette upper normal in size for AP portable technique, unchanged. Thoracic aorta mildly atherosclerotic, unchanged. Hilar and mediastinal contours otherwise unremarkable. Linear scarring in the right upper lobe, unchanged. Lungs otherwise clear. No localized airspace consolidation. No pleural effusions. No pneumothorax. Normal pulmonary vascularity.  IMPRESSION: Stable scarring in the right upper lobe. No acute cardiopulmonary disease.   Electronically Signed   By: Evangeline Dakin M.D.   On: 04/08/2015 09:26    Admission HPI: 54 yo female with cocaine abuse and Levamisole nduced vasculitis, HTN, depression, here with left axilla rash with drainage and also chest pain. She noticed the rash 3 days ago, noticed some yellow crusted drainage in the shower today. Also having significant pain around this area. Denies any fever/chills.  Also having chest pain, sharp, feels like could be coming from her axila, starting this morning while just sitting, does not radiate anywhere else. Does not change with movement or touching the chest. States it has almost resolved when we interviewed her. Denies SOB, n/v, dysphagia.   Has been admited in the past with ulcers on foot, ear, nose, and fingers. Was treated just recently with keflex. Goes to wound care outpatient for her leg wounds. Was counseled several times in the past about cocaine abstinence, however, continues to use cocaine. Last used 2 days ago.    Hospital Course by problem list:   1. Chest Pain: Positive cocaine on UDS. Transient chest pain associated with T-wave inversions in the precordial leads that were new compared to January 2016. Resolved on repeat EKG the following morning. She likely had cocaine induced vasospasm of her coronary arteries. Troponins  remained negative x 2. She was continued on aspirin 62m daily. She was counseled on cocaine abstinence.   2. Folliculitis of left axilla: Likely 2/2 Strep or Staph. She was placed on Doxy 100 mg every 12 hours for total 7 days of antibiotics (end date : 04/14/2015).   3. History of Lavamisole vasculitis and foot ulceration: She was seen in consultation by wound care RN. [facility], and this wound is improved per wound care RN. No topical care recommended at this time.  Discharge Vitals:   BP 135/66 mmHg  Pulse 72  Temp(Src) 97.7 F (36.5 C) (Oral)  Resp 19  Ht 5' 1.5" (1.562 m)  Wt 89 lb 14.4 oz (40.778 kg)  BMI 16.71 kg/m2  SpO2 100%  Discharge Labs:  Results for orders placed or performed during the hospital encounter of 04/07/15 (from the past 24 hour(s))  CBC with Differential/Platelet     Status: Abnormal   Collection Time: 04/07/15  7:31 PM  Result Value Ref Range   WBC 2.7 (L) 4.0 - 10.5 K/uL   RBC 3.30 (L) 3.87 - 5.11 MIL/uL   Hemoglobin 9.7 (L) 12.0 - 15.0 g/dL   HCT 29.0 (L) 36.0 - 46.0 %   MCV 87.9 78.0 - 100.0 fL   MCH 29.4 26.0 - 34.0 pg   MCHC 33.4 30.0 - 36.0 g/dL   RDW 13.5 11.5 - 15.5 %   Platelets 272 150 - 400 K/uL   Neutrophils Relative % 66 43 - 77 %   Neutro Abs 1.8 1.7 - 7.7 K/uL   Lymphocytes Relative 30 12 - 46 %   Lymphs Abs 0.8 0.7 - 4.0 K/uL   Monocytes Relative 2 (L) 3 - 12 %   Monocytes Absolute 0.1 0.1 - 1.0 K/uL   Eosinophils Relative 2 0 - 5 %   Eosinophils Absolute 0.1 0.0 - 0.7 K/uL   Basophils Relative 0 0 - 1 %   Basophils Absolute 0.0 0.0 - 0.1 K/uL  Comprehensive metabolic panel     Status: Abnormal   Collection Time: 04/07/15  7:31 PM  Result Value Ref Range   Sodium 139 135 - 145 mmol/L   Potassium 3.5 3.5 - 5.1 mmol/L   Chloride 107 101 - 111 mmol/L   CO2 23 22 - 32 mmol/L   Glucose, Bld 106 (H) 65 - 99 mg/dL   BUN 14 6 - 20 mg/dL   Creatinine, Ser 0.81 0.44 - 1.00 mg/dL     Calcium 8.8 (L) 8.9 - 10.3 mg/dL   Total Protein 5.9 (L) 6.5 - 8.1 g/dL   Albumin 2.9 (L) 3.5 - 5.0 g/dL   AST 17 15 - 41 U/L   ALT 11 (L) 14 - 54 U/L   Alkaline Phosphatase 51 38 - 126 U/L   Total Bilirubin 0.2 (L) 0.3 - 1.2 mg/dL   GFR calc non Af Amer >60 >60 mL/min   GFR calc Af Amer >60 >60 mL/min   Anion gap 9 5 - 15  I-stat troponin, ED     Status: None   Collection Time: 04/07/15 10:50 PM  Result Value Ref Range   Troponin i, poc 0.01 0.00 - 0.08 ng/mL   Comment 3          Urine rapid drug screen (hosp performed)     Status: Abnormal   Collection Time: 04/07/15 11:02 PM  Result Value Ref Range   Opiates POSITIVE (A) NONE DETECTED   Cocaine POSITIVE (A) NONE DETECTED   Benzodiazepines NONE DETECTED NONE DETECTED   Amphetamines NONE DETECTED NONE DETECTED   Tetrahydrocannabinol NONE DETECTED NONE DETECTED   Barbiturates NONE DETECTED NONE DETECTED  Vitamin B12     Status: None   Collection Time: 04/08/15  1:41 AM  Result Value Ref Range   Vitamin B-12 245 180 - 914 pg/mL  Folate     Status: None   Collection Time: 04/08/15  1:41 AM  Result Value Ref Range   Folate 10.1 >5.9 ng/mL  Iron and TIBC     Status: Abnormal   Collection Time: 04/08/15  1:41 AM  Result Value Ref Range   Iron 16 (L) 28 -  170 ug/dL   TIBC 232 (L) 250 - 450 ug/dL   Saturation Ratios 7 (L) 10.4 - 31.8 %   UIBC 216 ug/dL  Ferritin     Status: None   Collection Time: 04/08/15  1:41 AM  Result Value Ref Range   Ferritin 34 11 - 307 ng/mL  Reticulocytes     Status: Abnormal   Collection Time: 04/08/15  1:41 AM  Result Value Ref Range   Retic Ct Pct 1.1 0.4 - 3.1 %   RBC. 3.44 (L) 3.87 - 5.11 MIL/uL   Retic Count, Manual 37.8 19.0 - 186.0 K/uL  Basic metabolic panel     Status: Abnormal   Collection Time: 04/08/15  1:41 AM  Result Value Ref Range   Sodium 141 135 - 145 mmol/L   Potassium 3.6 3.5 - 5.1 mmol/L   Chloride 110 101 - 111 mmol/L   CO2 25 22 - 32 mmol/L   Glucose, Bld 91 65 -  99 mg/dL   BUN 16 6 - 20 mg/dL   Creatinine, Ser 0.72 0.44 - 1.00 mg/dL   Calcium 8.8 (L) 8.9 - 10.3 mg/dL   GFR calc non Af Amer >60 >60 mL/min   GFR calc Af Amer >60 >60 mL/min   Anion gap 6 5 - 15  CBC     Status: Abnormal   Collection Time: 04/08/15  1:41 AM  Result Value Ref Range   WBC 2.7 (L) 4.0 - 10.5 K/uL   RBC 3.36 (L) 3.87 - 5.11 MIL/uL   Hemoglobin 9.9 (L) 12.0 - 15.0 g/dL   HCT 29.3 (L) 36.0 - 46.0 %   MCV 87.2 78.0 - 100.0 fL   MCH 29.5 26.0 - 34.0 pg   MCHC 33.8 30.0 - 36.0 g/dL   RDW 13.5 11.5 - 15.5 %   Platelets 243 150 - 400 K/uL  HIV antibody     Status: None   Collection Time: 04/08/15  7:53 AM  Result Value Ref Range   HIV Screen 4th Generation wRfx Non Reactive Non Reactive  Troponin I (q 6hr x 3)     Status: None   Collection Time: 04/08/15 12:31 PM  Result Value Ref Range   Troponin I <0.03 <0.031 ng/mL    Signed: Milagros Loll, MD 04/08/2015, 2:10 PM    Services Ordered on Discharge: None Equipment Ordered on Discharge: None

## 2015-04-08 NOTE — Consult Note (Signed)
Patient well known to this [place] nurse from her previous admissions. She has history of levamisole vasculitis and has had ulcers on her face and lower extremities in the past. She is currently followed by [facility] for her foot, however this wound is much improved and she is currently only using dry dressings to manage it.  She has no open wounds currently on her face/ears or lower extremities other than the foot wound.  She has new area in the left axilla that is not actively draining today at the time of my assessment, appears dry and crusted in a few areas.  No topical care recommended at this time.  Discussed POC with patient and bedside nurse.  Re consult if needed, will not follow at this time. Thanks  Jace Fermin Kellogg, [place] [phone])

## 2015-04-08 NOTE — Progress Notes (Signed)
Initial Nutrition Assessment  DOCUMENTATION CODES:  Severe malnutrition in context of chronic illness  INTERVENTION:  Boost Breeze TID  NUTRITION DIAGNOSIS:  Malnutrition related to chronic illness as evidenced by severe depletion of body fat, severe depletion of muscle mass.   GOAL:  Patient will meet greater than or equal to 90% of their needs   MONITOR:  PO intake, Supplement acceptance, Labs, Weight trends  REASON FOR ASSESSMENT:  Malnutrition Screening Tool    ASSESSMENT:  54 yo female with cocaine abuse, recurrent Levamisole induced vasculitis, here with chest pain and left axilla rash.  Pt sleeping. She did wake slightly to allow me to complete exam and declines ensure but willing to try [employer].   Nutrition-Focused physical exam completed. Findings are severe fat depletion, severe muscle depletion, and no edema.  Labs and medications reviewed.   Breakfast at bedside, pt consumed 100% of meal.    Height:  Ht Readings from Last 1 Encounters:  04/08/15 5' 1.5" (1.562 m)    Weight:  Wt Readings from Last 1 Encounters:  04/08/15 89 lb 14.4 oz (40.778 kg)    Ideal Body Weight:  47.7 kg  Wt Readings from Last 10 Encounters:  04/08/15 89 lb 14.4 oz (40.778 kg)  03/15/15 94 lb (42.638 kg)  03/12/15 98 lb 1.6 oz (44.498 kg)  03/09/15 100 lb 15.5 oz (45.8 kg)  02/21/15 98 lb (44.453 kg)  01/30/15 92 lb 8 oz (41.958 kg)  01/23/15 93 lb 6.4 oz (42.366 kg)  01/14/15 88 lb 6.5 oz (40.1 kg)  01/14/15 91 lb 1.6 oz (41.323 kg)  10/09/14 94 lb (42.638 kg)    BMI:  Body mass index is 16.71 kg/(m^2).  Estimated Nutritional Needs:  Kcal:  1400-1600  Protein:  70-80 grams  Fluid:  > 1.5 L/day  Skin:  Wound (see comment) (Left foot wound, followed by [facility] and wound center)  Diet Order:  Diet Heart Room service appropriate?: Yes; Fluid consistency:: Thin  EDUCATION NEEDS:  No education needs identified at this time   Intake/Output Summary  (Last 24 hours) at 04/08/15 1239 Last data filed at 04/08/15 0659  Gross per 24 hour  Intake 366.22 ml  Output    550 ml  Net -183.78 ml    Last BM:  7/2  [place], [place], [place] Pager [phone] After Hours Pager

## 2015-04-08 NOTE — Progress Notes (Signed)
Subjective: No acute events overnight. She reports she is doing well this morning. She denies further chest pain or trouble breathing. Her facial swelling has improved.  Objective: Vital signs in last 24 hours: Filed Vitals:   04/08/15 0058 04/08/15 0059 04/08/15 0100 04/08/15 0844  BP:  145/72  135/66  Pulse:  72    Temp:   97.7 F (36.5 C)   TempSrc:   Oral   Resp:      Height: 5' 1.5" (1.562 m)     Weight: 89 lb 14.4 oz (40.778 kg)     SpO2:  100%     Weight change:   Intake/Output Summary (Last 24 hours) at 04/08/15 1152 Last data filed at 04/08/15 0659  Gross per 24 hour  Intake 366.22 ml  Output    550 ml  Net -183.78 ml   General Apperance: NAD HEENT: PERRL, EOMI, anicteric sclera Neck: Supple, trachea midline Lungs: Clear to auscultation bilaterally. No wheezes, rhonchi or rales. Breathing comfortably Heart: Regular rate and rhythm, no murmur/rub/gallop Abdomen: Soft, nontender, nondistended, no rebound/guarding Extremities: no edema Skin: Multiple 2-3 mm vasculitic changes throughout body. Left axilla with erythema and tenderness Neurologic: Alert and oriented x 3. No gross deficits  Lab Results: Basic Metabolic Panel:  Recent Labs Lab 04/07/15 1931 04/08/15 0141  NA 139 141  K 3.5 3.6  CL 107 110  CO2 23 25  GLUCOSE 106* 91  BUN 14 16  CREATININE 0.81 0.72  CALCIUM 8.8* 8.8*   Liver Function Tests:  Recent Labs Lab 04/07/15 1931  AST 17  ALT 11*  ALKPHOS 51  BILITOT 0.2*  PROT 5.9*  ALBUMIN 2.9*   CBC:  Recent Labs Lab 04/07/15 1931 04/08/15 0141  WBC 2.7* 2.7*  NEUTROABS 1.8  --   HGB 9.7* 9.9*  HCT 29.0* 29.3*  MCV 87.9 87.2  PLT 272 243   Cardiac Enzymes: No results for input(s): CKTOTAL, CKMB, CKMBINDEX, TROPONINI in the last 168 hours.  Anemia Panel:  Recent Labs Lab 04/08/15 0141  VITAMINB12 245  FOLATE 10.1  FERRITIN 34  TIBC 232*  IRON 16*  RETICCTPCT 1.1   Urine Drug Screen: Drugs of Abuse       Component Value Date/Time   LABOPIA POSITIVE* 04/07/2015 2302   LABOPIA PPS 08/31/2012 1045   COCAINSCRNUR POSITIVE* 04/07/2015 2302   COCAINSCRNUR NEG 08/31/2012 1045   LABBENZ NONE DETECTED 04/07/2015 2302   LABBENZ NEG 08/31/2012 1045   LABBENZ NEGATIVE 06/12/2011 0221   AMPHETMU NONE DETECTED 04/07/2015 2302   AMPHETMU NEG 08/31/2012 1045   AMPHETMU NEGATIVE 06/12/2011 0221   THCU NONE DETECTED 04/07/2015 2302   THCU NEG 08/31/2012 1045   THCU 63.4* 05/20/2012 1405   LABBARB NONE DETECTED 04/07/2015 2302   LABBARB NEG 08/31/2012 1045    Studies/Results: Dg Chest Port 1 View  04/08/2015   CLINICAL DATA:  Acute onset of left-sided chest pain yesterday. Current history of hypertension. Nonsmoker.  EXAM: PORTABLE CHEST - 1 VIEW  COMPARISON:  10/09/2014 and earlier.  FINDINGS: Cardiac silhouette upper normal in size for AP portable technique, unchanged. Thoracic aorta mildly atherosclerotic, unchanged. Hilar and mediastinal contours otherwise unremarkable. Linear scarring in the right upper lobe, unchanged. Lungs otherwise clear. No localized airspace consolidation. No pleural effusions. No pneumothorax. Normal pulmonary vascularity.  IMPRESSION: Stable scarring in the right upper lobe. No acute cardiopulmonary disease.   Electronically Signed   By: Evangeline Dakin M.D.   On: 04/08/2015 09:26   Medications: I have  reviewed the patient's current medications. Scheduled Meds: . aspirin EC  81 mg Oral Daily  . buPROPion  150 mg Oral Daily  . doxycycline  100 mg Oral Q12H  . enoxaparin (LOVENOX) injection  30 mg Subcutaneous Daily  . lisinopril  10 mg Oral Daily   Continuous Infusions: . sodium chloride 75 mL/hr at 04/08/15 0204   PRN Meds:.morphine injection, oxyCODONE Assessment/Plan: Active Problems:   Essential hypertension   Cocaine-induced vascular disorder   Major depression, recurrent   Foot ulceration   Cocaine abuse   Chest pain  Atypical Chest Pain: Likely secondary  to cocaine-induced vasospasm. Repeat EKG with normalization of previously seen T-wave inversions.  - Troponin pending - Aspirin 81 mg daily  - counseled on cocaine abstinence.   Folliculitis of left axilla: No new recommendations by wound care. - Continue Doxy 100 mg every 12 hours for total 7 days of antibiotics (end date : 04/14/2015) - Continue OxyIR 5 mg every 4 hours as needed in place of home hydrocodone-ibuprofen 7.5-200 mg every 8 hours as needed for pain.  Chronic normocytic anemia: likely b/l 9-11. Hemoglobin stable. Vitamin B12 and folate within normal limits. Iron and TIBC low with normal ferritin. Likely secondary to anemia of chronic disease.  Depression: continue home bupropion 24 hour 150 mg daily  HTN: Continue home lisinopril 10 mg daily  FEN: H/H diet  VTE ppx: Lovenox  Dispo: Likely discharge home today  The patient does have a current PCP (No primary care provider on file.) and does need an [facility] hospital follow-up appointment after discharge.  The patient does not know have transportation limitations that hinder transportation to clinic appointments.  .Services Needed at time of discharge: Y = Yes, Blank = No PT:   OT:   RN:   Equipment:   Other:       Milagros Loll, MD 04/08/2015, 11:52 AM

## 2015-04-08 NOTE — Discharge Instructions (Addendum)
Please pick up your prescription for doxycycline from your pharmacy. Take 1 tablet every 12 hours until you run out of the antibiotic.  If you have worsening of your symptoms or fever greater than 100.5, vision changes, coughing up blood, vomiting, please call the clinic ([phone]), or go to the ER immediately if symptoms are severe.  Chest Pain It is often hard to give a diagnosis for the cause of chest pain. There is always a chance that your pain could be related to something serious, such as a heart attack or a blood clot in the lungs. You need to follow up with your doctor. HOME CARE  If antibiotic medicine was given, take it as directed by your doctor. Finish the medicine even if you start to feel better.  For the next few days, avoid activities that bring on chest pain. Continue physical activities as told by your doctor.  Do not use any tobacco products. This includes cigarettes, chewing tobacco, and e-cigarettes.  Avoid drinking alcohol.  Only take medicine as told by your doctor.  Follow your doctor's suggestions for more testing if your chest pain does not go away.  Keep all doctor visits you made. GET HELP IF:  Your chest pain does not go away, even after treatment.  You have a rash with blisters on your chest.  You have a fever. GET HELP RIGHT AWAY IF:   You have more pain or pain that spreads to your arm, neck, jaw, back, or belly (abdomen).  You have shortness of breath.  You cough more than usual or cough up blood.  You have very bad back or belly pain.  You feel sick to your stomach (nauseous) or throw up (vomit).  You have very bad weakness.  You pass out (faint).  You have chills. This is an emergency. Do not wait to see if the problems will go away. Call your local emergency services (911 in [place] Do not drive yourself to the hospital. MAKE SURE YOU:   Understand these instructions.  Will watch your condition.  Will get help right away if you  are not doing well or get worse. Document Released: 03/09/2008 Document Revised: 09/26/2013 Document Reviewed: 03/09/2008 [facility] Patient Information 2015 [place], [place]. This information is not intended to replace advice given to you by your health care provider. Make sure you discuss any questions you have with your health care provider.

## 2015-04-09 LAB — HIV ANTIBODY (ROUTINE TESTING W REFLEX)

## 2015-04-11 LAB — METHYLMALONIC ACID, SERUM: METHYLMALONIC ACID, QUANTITATIVE: 132 nmol/L (ref 0–378)

## 2015-04-11 LAB — HIV ANTIBODY (ROUTINE TESTING W REFLEX): HIV SCREEN 4TH GENERATION: NONREACTIVE

## 2015-04-16 ENCOUNTER — Encounter (HOSPITAL_BASED_OUTPATIENT_CLINIC_OR_DEPARTMENT_OTHER): Payer: Medicaid Other | Attending: General Surgery

## 2015-04-16 DIAGNOSIS — I776 Arteritis, unspecified: Secondary | ICD-10-CM | POA: Diagnosis not present

## 2015-04-16 DIAGNOSIS — D649 Anemia, unspecified: Secondary | ICD-10-CM | POA: Diagnosis not present

## 2015-04-16 DIAGNOSIS — L97821 Non-pressure chronic ulcer of other part of left lower leg limited to breakdown of skin: Secondary | ICD-10-CM | POA: Insufficient documentation

## 2015-04-16 DIAGNOSIS — F17210 Nicotine dependence, cigarettes, uncomplicated: Secondary | ICD-10-CM | POA: Diagnosis not present

## 2015-04-16 DIAGNOSIS — M1990 Unspecified osteoarthritis, unspecified site: Secondary | ICD-10-CM | POA: Insufficient documentation

## 2015-04-16 DIAGNOSIS — L97521 Non-pressure chronic ulcer of other part of left foot limited to breakdown of skin: Secondary | ICD-10-CM | POA: Diagnosis not present

## 2015-04-16 DIAGNOSIS — G43901 Migraine, unspecified, not intractable, with status migrainosus: Secondary | ICD-10-CM | POA: Diagnosis not present

## 2015-04-16 DIAGNOSIS — L98491 Non-pressure chronic ulcer of skin of other sites limited to breakdown of skin: Secondary | ICD-10-CM | POA: Insufficient documentation

## 2015-04-16 DIAGNOSIS — I10 Essential (primary) hypertension: Secondary | ICD-10-CM | POA: Diagnosis not present

## 2015-04-16 DIAGNOSIS — M069 Rheumatoid arthritis, unspecified: Secondary | ICD-10-CM | POA: Insufficient documentation

## 2015-04-16 DIAGNOSIS — F329 Major depressive disorder, single episode, unspecified: Secondary | ICD-10-CM | POA: Diagnosis not present

## 2015-04-21 ENCOUNTER — Encounter (HOSPITAL_COMMUNITY): Payer: Self-pay | Admitting: Emergency Medicine

## 2015-04-21 ENCOUNTER — Emergency Department (HOSPITAL_COMMUNITY): Payer: Medicaid Other

## 2015-04-21 ENCOUNTER — Observation Stay (HOSPITAL_COMMUNITY)
Admission: EM | Admit: 2015-04-21 | Discharge: 2015-04-22 | Disposition: A | Discharge status: Home / Self Care | Payer: Medicaid Other | Attending: Internal Medicine | Admitting: Internal Medicine

## 2015-04-21 DIAGNOSIS — H9209 Otalgia, unspecified ear: Secondary | ICD-10-CM | POA: Diagnosis not present

## 2015-04-21 DIAGNOSIS — I96 Gangrene, not elsewhere classified: Secondary | ICD-10-CM | POA: Diagnosis not present

## 2015-04-21 DIAGNOSIS — F1220 Cannabis dependence, uncomplicated: Secondary | ICD-10-CM | POA: Diagnosis not present

## 2015-04-21 DIAGNOSIS — F329 Major depressive disorder, single episode, unspecified: Secondary | ICD-10-CM

## 2015-04-21 DIAGNOSIS — F14288 Cocaine dependence with other cocaine-induced disorder: Secondary | ICD-10-CM

## 2015-04-21 DIAGNOSIS — R0789 Other chest pain: Secondary | ICD-10-CM | POA: Diagnosis not present

## 2015-04-21 DIAGNOSIS — F1410 Cocaine abuse, uncomplicated: Secondary | ICD-10-CM | POA: Diagnosis not present

## 2015-04-21 DIAGNOSIS — R05 Cough: Secondary | ICD-10-CM

## 2015-04-21 DIAGNOSIS — F14988 Cocaine use, unspecified with other cocaine-induced disorder: Secondary | ICD-10-CM

## 2015-04-21 DIAGNOSIS — H9203 Otalgia, bilateral: Secondary | ICD-10-CM

## 2015-04-21 DIAGNOSIS — I776 Arteritis, unspecified: Secondary | ICD-10-CM | POA: Diagnosis not present

## 2015-04-21 DIAGNOSIS — D638 Anemia in other chronic diseases classified elsewhere: Secondary | ICD-10-CM | POA: Diagnosis not present

## 2015-04-21 DIAGNOSIS — I10 Essential (primary) hypertension: Secondary | ICD-10-CM | POA: Diagnosis not present

## 2015-04-21 DIAGNOSIS — F17210 Nicotine dependence, cigarettes, uncomplicated: Secondary | ICD-10-CM | POA: Diagnosis not present

## 2015-04-21 DIAGNOSIS — L8990 Pressure ulcer of unspecified site, unspecified stage: Secondary | ICD-10-CM | POA: Diagnosis present

## 2015-04-21 DIAGNOSIS — D649 Anemia, unspecified: Secondary | ICD-10-CM

## 2015-04-21 DIAGNOSIS — L959 Vasculitis limited to the skin, unspecified: Secondary | ICD-10-CM | POA: Diagnosis not present

## 2015-04-21 DIAGNOSIS — I999 Unspecified disorder of circulatory system: Secondary | ICD-10-CM

## 2015-04-21 DIAGNOSIS — R059 Cough, unspecified: Secondary | ICD-10-CM

## 2015-04-21 LAB — URINALYSIS, ROUTINE W REFLEX MICROSCOPIC
BILIRUBIN URINE: NEGATIVE
Glucose, UA: NEGATIVE mg/dL
Hgb urine dipstick: NEGATIVE
KETONES UR: NEGATIVE mg/dL
LEUKOCYTES UA: NEGATIVE
NITRITE: NEGATIVE
PROTEIN: NEGATIVE mg/dL
Specific Gravity, Urine: 1.012 (ref 1.005–1.030)
Urobilinogen, UA: 1 mg/dL (ref 0.0–1.0)
pH: 7 (ref 5.0–8.0)

## 2015-04-21 LAB — COMPREHENSIVE METABOLIC PANEL
ALT: 13 U/L — ABNORMAL LOW (ref 14–54)
ANION GAP: 9 (ref 5–15)
AST: 30 U/L (ref 15–41)
Albumin: 3.5 g/dL (ref 3.5–5.0)
Alkaline Phosphatase: 61 U/L (ref 38–126)
BILIRUBIN TOTAL: 0.5 mg/dL (ref 0.3–1.2)
BUN: 14 mg/dL (ref 6–20)
CALCIUM: 8.8 mg/dL — AB (ref 8.9–10.3)
CO2: 25 mmol/L (ref 22–32)
Chloride: 100 mmol/L — ABNORMAL LOW (ref 101–111)
Creatinine, Ser: 0.73 mg/dL (ref 0.44–1.00)
GFR calc Af Amer: >60 mL/min (ref >60)
GFR calc non Af Amer: >60 mL/min (ref >60)
Glucose, Bld: 98 mg/dL (ref 65–99)
Potassium: 3.5 mmol/L (ref 3.5–5.1)
SODIUM: 134 mmol/L — AB (ref 135–145)
TOTAL PROTEIN: 7.4 g/dL (ref 6.5–8.1)

## 2015-04-21 LAB — RAPID URINE DRUG SCREEN, HOSP PERFORMED
Amphetamines: NOT DETECTED
Barbiturates: NOT DETECTED
Benzodiazepines: NOT DETECTED
COCAINE: POSITIVE — AB
Opiates: NOT DETECTED
Tetrahydrocannabinol: NOT DETECTED

## 2015-04-21 LAB — LIPID PANEL
Cholesterol: 185 mg/dL (ref 0–200)
HDL: 50 mg/dL (ref >40)
LDL Cholesterol: 125 mg/dL — ABNORMAL HIGH (ref 0–99)
Total CHOL/HDL Ratio: 3.7 RATIO
Triglycerides: 48 mg/dL (ref <150)
VLDL: 10 mg/dL (ref 0–40)

## 2015-04-21 LAB — TROPONIN I
Troponin I: <0.03 ng/mL (ref <0.031)
Troponin I: <0.03 ng/mL (ref <0.031)
Troponin I: <0.03 ng/mL (ref <0.031)

## 2015-04-21 LAB — CBC WITH DIFFERENTIAL/PLATELET
Basophils Absolute: 0 10*3/uL (ref 0.0–0.1)
Basophils Relative: 0 % (ref 0–1)
EOS ABS: 0.1 10*3/uL (ref 0.0–0.7)
EOS PCT: 2 % (ref 0–5)
HCT: 33.2 % — ABNORMAL LOW (ref 36.0–46.0)
Hemoglobin: 11.3 g/dL — ABNORMAL LOW (ref 12.0–15.0)
Lymphocytes Relative: 27 % (ref 12–46)
Lymphs Abs: 0.9 10*3/uL (ref 0.7–4.0)
MCH: 29.4 pg (ref 26.0–34.0)
MCHC: 34 g/dL (ref 30.0–36.0)
MCV: 86.2 fL (ref 78.0–100.0)
MONO ABS: 0.1 10*3/uL (ref 0.1–1.0)
Monocytes Relative: 2 % — ABNORMAL LOW (ref 3–12)
Neutro Abs: 2.3 10*3/uL (ref 1.7–7.7)
Neutrophils Relative %: 69 % (ref 43–77)
PLATELETS: 287 10*3/uL (ref 150–400)
RBC: 3.85 MIL/uL — ABNORMAL LOW (ref 3.87–5.11)
RDW: 13 % (ref 11.5–15.5)
WBC: 3.3 10*3/uL — ABNORMAL LOW (ref 4.0–10.5)

## 2015-04-21 LAB — C-REACTIVE PROTEIN: CRP: 2.7 mg/dL — ABNORMAL HIGH (ref <1.0)

## 2015-04-21 LAB — SEDIMENTATION RATE: SED RATE: 55 mm/h — AB (ref 0–22)

## 2015-04-21 MED ORDER — ENOXAPARIN SODIUM 40 MG/0.4ML ~~LOC~~ SOLN
40.0000 mg | SUBCUTANEOUS | Status: DC
Start: 2015-04-21 — End: 2015-04-21
  Administered 2015-04-21: 40 mg via SUBCUTANEOUS
  Filled 2015-04-21: qty 0.4

## 2015-04-21 MED ORDER — KETOROLAC TROMETHAMINE 15 MG/ML IJ SOLN
15.0000 mg | Freq: Four times a day (QID) | INTRAMUSCULAR | Status: DC | PRN
  Administered 2015-04-21 – 2015-04-22 (×4): 15 mg via INTRAVENOUS
  Filled 2015-04-21 (×6): qty 1

## 2015-04-21 MED ORDER — HYDROMORPHONE HCL 1 MG/ML IJ SOLN
0.5000 mg | Freq: Once | INTRAMUSCULAR | Status: AC
  Administered 2015-04-21: 0.5 mg via INTRAVENOUS
  Filled 2015-04-21: qty 1

## 2015-04-21 MED ORDER — LISINOPRIL 10 MG PO TABS
10.0000 mg | ORAL_TABLET | Freq: Every day | ORAL | Status: DC
  Administered 2015-04-21 – 2015-04-22 (×2): 10 mg via ORAL
  Filled 2015-04-21 (×3): qty 1

## 2015-04-21 MED ORDER — METHYLPREDNISOLONE SODIUM SUCC 125 MG IJ SOLR
125.0000 mg | Freq: Once | INTRAMUSCULAR | Status: AC
  Administered 2015-04-21: 125 mg via INTRAVENOUS
  Filled 2015-04-21: qty 2

## 2015-04-21 MED ORDER — ENOXAPARIN SODIUM 30 MG/0.3ML ~~LOC~~ SOLN
30.0000 mg | SUBCUTANEOUS | Status: DC
  Administered 2015-04-22: 30 mg via SUBCUTANEOUS
  Filled 2015-04-21 (×2): qty 0.3

## 2015-04-21 MED ORDER — ASPIRIN EC 81 MG PO TBEC
81.0000 mg | DELAYED_RELEASE_TABLET | Freq: Every day | ORAL | Status: DC
  Administered 2015-04-21 – 2015-04-22 (×2): 81 mg via ORAL
  Filled 2015-04-21 (×3): qty 1

## 2015-04-21 MED ORDER — HYDRALAZINE HCL 20 MG/ML IJ SOLN
10.0000 mg | Freq: Once | INTRAMUSCULAR | Status: AC
  Administered 2015-04-21: 10 mg via INTRAVENOUS
  Filled 2015-04-21: qty 1
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
## 2015-04-21 NOTE — Progress Notes (Signed)
Pharmacy- May adjust loveonx DVT prophylaxis dose.   Assessment:  54 y.o female with history of cocaine abuse complicated by a levamisole vasculitis, hypertension, and depression presenting with bilateral ear pain.  Active Problems:  Skin necrosis Weight = 39.9 kg SCr = 0.73,  CrCl is ~ 50 ml/min. H/H 11.3/33.2,  PLTC 287 wnl.  Plan: Lovenox dose adjusted to 63m SQ q24h due to patient's weight <45 kg (wt = 40 kg). CrCl is ~ 50 ml/min. H/H 11.3/33.2,  PLTC 287 wnl.    Thank you for allowing pharmacy to be part of this patients care team. RNicole Cella RPh Clinical Pharmacist Pager: [phone]/17/2016 11:33 PM

## 2015-04-21 NOTE — ED Provider Notes (Signed)
CSN: [id number]     Arrival date & time 04/21/15  7106 History   First MD Initiated Contact with Patient 04/21/15 1010     Chief Complaint  Patient presents with  . 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 Patient reports that last night she smoked crack given to her by a friend. Patient states that over the past 12 hours she is beginning having pain in her ears, nose, cheeks. Patient states she has experienced identical signs and symptoms the past when her crack has been laced with an unknown substance to her. Patient states she believes she smoked crack last night that was laced with this same substance. Patient reports a mild chest discomfort, only when coughing. Patient denies persistent chest pain, shortness of breath, headache, blurred vision, dizziness, weakness, nausea, vomiting, abdominal pain, dysuria.  Past Medical History  Diagnosis Date  . 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 Followed by Dr. Louanne Skye  . Hypertension   . Cocaine abuse     ongoing with resultant vaculitis.  . Depression   . Normocytic anemia     BL Hgb 9.8-12. Last anemia panel 04/2010 - showing Fe 19, ferritin 101.  Pt on monthly B12 injections  . 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 CAP (community acquired pneumonia) 03/2014 X 2  . Daily headache   . Migraines     "probably 5-6/yr" (04/21/2014)  . Rheumatoid arthritis(714.0)     patient reported  . Inflammatory arthritis    Past Surgical History  Procedure Laterality Date  . Skin biopsy Bilateral 04/2010    shin nodules  . Irrigation and debridement abscess Bilateral 09/26/2013    Procedure: DEBRIDEMENT ULCERS BILATERAL  THIGHS;  Surgeon: Gwenyth Ober, MD;  Location: [facility];  Service: General;  Laterality: Bilateral;  . Hernia repair      "stomach"   Family History  Problem Relation Age of Onset  . Breast cancer Mother     Breast cancer  . Alcohol abuse Mother   . Colon cancer Maternal Aunt 61  . Alcohol abuse Father    History  Substance Use Topics  . Smoking status: Current Every Day Smoker -- 0.30 packs/day for 34 years    Types: Cigarettes  . Smokeless tobacco: Never Used     Comment: "Trying very hard".  . Alcohol Use: No   OB History    No data available     Review of Systems  Constitutional: Negative for fever.  HENT: Negative for trouble swallowing.   Eyes: Negative for visual disturbance.  Respiratory: Negative for shortness of breath.   Cardiovascular: Negative for chest pain.  Gastrointestinal: Negative for nausea, vomiting and abdominal pain.  Genitourinary: Negative for dysuria.  Musculoskeletal: Negative for neck pain.  Skin: Positive for color change and rash.  Neurological: Negative for dizziness, weakness and numbness.  Psychiatric/Behavioral: Negative.       Allergies  Acetaminophen  Home Medications   Prior to Admission medications   Medication Sig Start Date End Date Taking? Authorizing Provider  lisinopril (PRINIVIL,ZESTRIL) 10 MG tablet Take 1 tablet (10 mg total) by mouth daily. 03/11/15  Yes Kelby Aline, MD  naproxen (NAPROSYN) 500 MG tablet Take 1 tablet (500 mg total) by  mouth 2 (two) times daily with a meal. 04/08/15  Yes Milagros Loll, MD  aspirin EC 81 MG EC tablet Take 1 tablet (81 mg total) by mouth daily. Patient not taking: Reported on 04/21/2015 04/08/15   Milagros Loll, MD  buPROPion (WELLBUTRIN XL) 150 MG 24 hr tablet Take 1 tablet (150 mg total) by mouth every morning. Patient not taking: Reported on 04/21/2015 03/12/15 03/11/16  Karlene Einstein, MD  traZODone (DESYREL) 50 MG tablet Take 1 tablet (50 mg total) by mouth at bedtime and may repeat  dose one time if needed. Patient not taking: Reported on 04/21/2015 03/15/15   Langley Gauss Moding, MD   BP 158/81 mmHg  Pulse 87  Temp(Src) 99.7 F (37.6 C)  Resp 21  Ht 5' 1"  (1.549 m)  Wt 88 lb (39.917 kg)  BMI 16.64 kg/m2  SpO2 99% Physical Exam  Constitutional: She is oriented to person, place, and time. She appears well-developed and well-nourished. No distress.  HENT:  Head: Normocephalic and atraumatic.  Mouth/Throat: Oropharynx is clear and moist. No oropharyngeal exudate.  Eyes: Right eye exhibits no discharge. Left eye exhibits no discharge. No scleral icterus.  Neck: Normal range of motion.  Cardiovascular: Normal rate, regular rhythm and normal heart sounds.   No murmur heard. Pulmonary/Chest: Effort normal and breath sounds normal. No respiratory distress.  Abdominal: Soft. There is no tenderness.  Musculoskeletal: Normal range of motion. She exhibits no edema or tenderness.  Neurological: She is alert and oriented to person, place, and time. No cranial nerve deficit. Coordination normal.  Skin: Skin is warm and dry. No rash noted. She is not diaphoretic.  Psychiatric: She has a normal mood and affect.  Nursing note and vitals reviewed.   ED Course  Procedures (including critical care time) Labs Review Labs Reviewed  COMPREHENSIVE METABOLIC PANEL - Abnormal; Notable for the following:    Sodium 134 (*)    Chloride 100 (*)    Calcium 8.8 (*)    ALT 13 (*)    All other components within normal limits  URINE RAPID DRUG SCREEN, HOSP PERFORMED - Abnormal; Notable for the following:    Cocaine POSITIVE (*)    All other components within normal limits  SEDIMENTATION RATE - Abnormal; Notable for the following:    Sed Rate 55 (*)    All other components within normal limits  C-REACTIVE PROTEIN - Abnormal; Notable for the following:    CRP 2.7 (*)    All other components within normal limits  CBC WITH DIFFERENTIAL/PLATELET - Abnormal; Notable for the following:    WBC  3.3 (*)    RBC 3.85 (*)    Hemoglobin 11.3 (*)    HCT 33.2 (*)    Monocytes Relative 2 (*)    All other components within normal limits  TROPONIN I  URINALYSIS, ROUTINE W REFLEX MICROSCOPIC (NOT AT [facility])  CBC WITH DIFFERENTIAL/PLATELET  TROPONIN I  TROPONIN I  HEMOGLOBIN A1C  LIPID PANEL    Imaging Review Dg Chest 2 View  04/21/2015   CLINICAL DATA:  Productive cough  EXAM: CHEST  2 VIEW  COMPARISON:  04/08/2015  FINDINGS: Cardiomediastinal silhouette is stable. Mild hyperinflation. Nodular nipple shadow is noted bilaterally. No acute infiltrate or pulmonary edema. Stable linear scarring in right upper lobe.  IMPRESSION: No active cardiopulmonary disease. Mild hyperinflation. Nodular nipple shadow is noted bilaterally.   Electronically Signed   By: Lahoma Crocker M.D.   On: 04/21/2015 11:15  EKG Interpretation   Date/Time:  Sunday April 21 2015 10:13:29 EDT Ventricular Rate:  105 PR Interval:  162 QRS Duration: 83 QT Interval:  368 QTC Calculation: 486 R Axis:   81 Text Interpretation:  Sinus tachycardia Biatrial enlargement Left  ventricular hypertrophy Borderline prolonged QT interval Artifact in  lead(s) I II III aVR aVL aVF V1 V2 V3 V4 V5 V6 and baseline wander in  lead(s) V6 Confirmed by Jeneen Rinks  MD, [place] ([id number]) on 04/21/2015 11:46:15 AM                MDM   Final diagnoses:  Cough  Skin necrosis    Patient with necrosis 2 years, maxilla, tip of nose consistent with vasculitis. Please note pictures in chart.  Patient reporting having similar signs and symptoms the past. As noted in patient's history to have vasculitis secondary to use of crack mixed with levimazole.  Patient reports using crack last night that she believes was "cut with this chemical". Patient does have some atypical chest discomfort, consistent with chest wall pain stating that it only hurts when she coughs. Based on patient's cocaine use, ruled out for ACS. Patient is hypertensive, however  troponin is negative greater than 6 hours after onset of symptoms. Pain is atypical. EKG without evidence of acute injury or ectopy. No concern for ACS at this time. Neurologic exam is benign, lab work unremarkable for acute pathology. No other signs and symptoms of end organ damage secondary to vasculitis or cocaine use. Based on extent of necrosis, likely patient will need debridement and observation. Patient admitted to medicine for further observation and w/u.  The patient appears reasonably stabilized for admission considering the current resources, flow, and capabilities available in the ED at this time, and I doubt any other [facility] requiring further screening and/or treatment in the ED prior to admission.  BP 158/81 mmHg  Pulse 87  Temp(Src) 99.7 F (37.6 C)  Resp 21  Ht 5' 1"  (1.549 m)  Wt 88 lb (39.917 kg)  BMI 16.64 kg/m2  SpO2 99%  Signed,  Dahlia Bailiff, PA-C 4:47 PM  Patient seen and discussed with Dr. Tanna Furry, MD   Dahlia Bailiff, PA-C 04/21/15 1647  Dahlia Bailiff, PA-C 04/21/15 [place], MD 04/22/15 2208

## 2015-04-21 NOTE — ED Provider Notes (Addendum)
Pt seen and evaluated.  Exam with signs of necrosis to ears, maxilla, nasal tip.  Pain to hands, but no ulcerations necrosis, lesions.  Pt with prior history of vasculitis 2/2 Cocaine + Levamisole.  Appears to be the same now.  No prior debriedment on last admit.  Will R/O ACs a spatient c/o CP as a secondary offering. (main c/o is Ear pain).  Patient without Agranulocytosis that can also be 2/2 the Levasimole.  Will require admit.  Tanna Furry, MD 04/21/15 [place], MD 04/21/15 1150

## 2015-04-21 NOTE — Plan of Care (Signed)
Problem: Consults Goal: Skin Care Protocol Initiated - if Braden Score 18 or less If consults are not indicated, leave blank or document N/A Outcome: Progressing Skin consult

## 2015-04-21 NOTE — H&P (Signed)
Date: 04/21/2015               Patient Name:  [name] MRN: [id number]  DOB: [birth year] Age / Sex: 55 y.o., female   PCP: No primary care provider on file.         Medical Service: Internal Medicine Teaching Service         Attending Physician: Dr. Aldine Contes, MD    First Contact: Dr. Randell Patient Pager: [phone]  Second Contact: Dr. Ronnald Ramp Pager: [phone]       After Hours (After 5p/  First Contact Pager: [phone]  weekends / holidays): Second Contact Pager: [phone]   Chief Complaint: Ear pain  History of Present Illness: [name] is a 54 year old woman with history of cocaine abuse complicated by a levamisole vasculitis, hypertension, and depression presenting with bilateral ear pain. She reports she last used cocaine on Friday. She noticed last night that she was having bilateral ear pain and necrosis. She reports this is similar to previous lavamisole vasculitis episodes. She is followed by wound care clinic at [facility]. She reports the wound on the dorsal aspect of her foot is healing well. She has a new superficial wound on the posterior ankle. She reports subjective fevers. Her cough is productive of yellow sputum. She has nausea but no emesis. Denies abdominal pain or diarrhea, dysuria, hematuria.  She had mild chest pain earlier that is located mid sternum. It occurs with coughing and worsened by deep breaths. It is an aching pain that is intermittent.   She was last hospitalized 04/07/2015-04/08/2015 for chest pain and L axilla folliculitis. She completed her antibiotics and reports resolution of the folliculitis.  Meds: No current facility-administered medications for this encounter.   Current Outpatient Prescriptions  Medication Sig Dispense Refill  . lisinopril (PRINIVIL,ZESTRIL) 10 MG tablet Take 1 tablet (10 mg total) by mouth daily. 30 tablet 0  . naproxen (NAPROSYN) 500 MG tablet Take 1 tablet (500 mg total) by mouth 2 (two) times daily with a meal. 60 tablet  0  . aspirin EC 81 MG EC tablet Take 1 tablet (81 mg total) by mouth daily. (Patient not taking: Reported on 04/21/2015) 30 tablet 0  . buPROPion (WELLBUTRIN XL) 150 MG 24 hr tablet Take 1 tablet (150 mg total) by mouth every morning. (Patient not taking: Reported on 04/21/2015) 30 tablet 2  . traZODone (DESYREL) 50 MG tablet Take 1 tablet (50 mg total) by mouth at bedtime and may repeat dose one time if needed. (Patient not taking: Reported on 04/21/2015) 30 tablet 0    Allergies: Allergies as of 04/21/2015 - Review Complete 04/21/2015  Allergen Reaction Noted  . Acetaminophen Swelling    Past Medical History  Diagnosis Date  . 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 Followed by Dr. Louanne Skye  . Hypertension   . Cocaine abuse     ongoing with resultant vaculitis.  . Depression   . Normocytic anemia     BL Hgb 9.8-12. Last anemia panel 04/2010 - showing Fe 19, ferritin 101.  Pt on monthly B12 injections  . 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 CAP (community acquired pneumonia) 03/2014 X 2  . Daily headache   . Migraines     "probably 5-6/yr" (04/21/2014)  . Rheumatoid arthritis(714.0)     patient reported  . Inflammatory arthritis    Past Surgical History  Procedure Laterality Date  . Skin biopsy  Bilateral 04/2010    shin nodules  . Irrigation and debridement abscess Bilateral 09/26/2013    Procedure: DEBRIDEMENT ULCERS BILATERAL THIGHS;  Surgeon: Gwenyth Ober, MD;  Location: [facility];  Service: General;  Laterality: Bilateral;  . Hernia repair      "stomach"   Family History  Problem Relation Age of Onset  . Breast cancer Mother     Breast cancer  . Alcohol abuse Mother   . Colon cancer Maternal Aunt 58  . Alcohol abuse Father    History   Social History  . Marital Status: Single    Spouse Name: N/A  . Number of Children: 0  . Years of Education: 11th grade   Occupational History  . Disability     since 2011, due to her rheumatoid  arthritis   Social History Main Topics  . Smoking status: Current Every Day Smoker -- 52 packs/day for 34 years    Types: Cigarettes  . Smokeless tobacco: Never Used     Comment: "Trying very hard".  . Alcohol Use: No  . Drug Use: Yes    Special: "Crack" cocaine, Cocaine     Comment: States she still uses crack "a little bit".  . Sexual Activity: Not on file   Other Topics Concern  . Not on file   Social History Narrative   Unemployed:  cleaning in past   Living at [place]; has Medicaid.   crack/cocaine use; pt denies IVDU   tobacco:  1/2 ppd, trying to quit   alcohol:  none        Review of Systems: Constitutional: +subjective fevers/chills Eyes: no vision changes Ears, nose, mouth, throat, and face: +cough Respiratory: no shortness of breath Cardiovascular: +chest pain Gastrointestinal: +nausea, no vomiting, no abdominal pain, no constipation, no diarrhea Genitourinary: no dysuria, no hematuria Integument: no rash Hematologic/lymphatic: no bleeding/bruising, +edema Musculoskeletal: no arthralgias, no myalgias Neurological: +chronic paresthesias, no weakness  Physical Exam: Blood pressure 158/81, pulse 87, temperature 99.7 F (37.6 C), resp. rate 21, height _0  (1.549 m), weight 88 lb (39.917 kg), SpO2 99 %. General Apperance: NAD Head: Normocephalic, atraumatic Eyes: PERRL, EOMI, anicteric sclera Ears: necrotic appearing helices bilaterally with some erythema Nose: septum midline, mucosa normal Throat: Lips, mucosa and tongue normal  Neck: Supple, trachea midline Back: No tenderness or bony abnormality  Lungs: Clear to auscultation bilaterally. No wheezes, rhonchi or rales. Breathing comfortably Chest Wall: Nontender, no deformity Heart: Regular rate and rhythm, no murmur/rub/gallop Abdomen: Soft, nontender, nondistended, no rebound/guarding Extremities: warm and well perfused, +bilateral hand edema Pulses: 2+ throughout Skin: Chronic vasculitic  changes of the ears, nose, legs, and feet, superficial wound on posterior aspect of left foot. Wound on L dorsal foot appears improved. Neurologic: Alert and oriented x 3. CNII-XII intact. Normal strength and sensation          Lab results: Basic Metabolic Panel:  Recent Labs  04/21/15 1029  NA 134*  K 3.5  CL 100*  CO2 25  GLUCOSE 98  BUN 14  CREATININE 0.73  CALCIUM 8.8*   Liver Function Tests:  Recent Labs  04/21/15 1029  AST 30  ALT 13*  ALKPHOS 61  BILITOT 0.5  PROT 7.4  ALBUMIN 3.5   CBC:  Recent Labs  04/21/15 1118  WBC 3.3*  NEUTROABS 2.3  HGB 11.3*  HCT 33.2*  MCV 86.2  PLT 287   Cardiac Enzymes:  Recent Labs  04/21/15 1029  TROPONINI <0.03   Urine Drug  Screen: Drugs of Abuse     Component Value Date/Time   LABOPIA NONE DETECTED 04/21/2015 1120   LABOPIA PPS 08/31/2012 1045   COCAINSCRNUR POSITIVE* 04/21/2015 1120   COCAINSCRNUR NEG 08/31/2012 1045   LABBENZ NONE DETECTED 04/21/2015 1120   LABBENZ NEG 08/31/2012 1045   LABBENZ NEGATIVE 06/12/2011 0221   AMPHETMU NONE DETECTED 04/21/2015 1120   AMPHETMU NEG 08/31/2012 1045   AMPHETMU NEGATIVE 06/12/2011 0221   THCU NONE DETECTED 04/21/2015 1120   THCU NEG 08/31/2012 1045   THCU 63.4* 05/20/2012 1405   LABBARB NONE DETECTED 04/21/2015 1120   LABBARB NEG 08/31/2012 1045    Urinalysis:  Recent Labs  04/21/15 1120  COLORURINE YELLOW  LABSPEC 1.012  PHURINE 7.0  GLUCOSEU NEGATIVE  HGBUR NEGATIVE  BILIRUBINUR NEGATIVE  KETONESUR NEGATIVE  PROTEINUR NEGATIVE  UROBILINOGEN 1.0  NITRITE NEGATIVE  LEUKOCYTESUR NEGATIVE    Imaging results:  Dg Chest 2 View  04/21/2015   CLINICAL DATA:  Productive cough  EXAM: CHEST  2 VIEW  COMPARISON:  04/08/2015  FINDINGS: Cardiomediastinal silhouette is stable. Mild hyperinflation. Nodular nipple shadow is noted bilaterally. No acute infiltrate or pulmonary edema. Stable linear scarring in right upper lobe.  IMPRESSION: No active  cardiopulmonary disease. Mild hyperinflation. Nodular nipple shadow is noted bilaterally.   Electronically Signed   By: Lahoma Crocker M.D.   On: 04/21/2015 11:15    Other results: EKG: Sinus tachycardia, QTc 486. Otherwise unchanged from previous EKG.  Assessment & Plan by Problem: Active Problems:   Skin necrosis  Atypical chest pain: Mild chest pain associated with cough. Previous admission with negative ACS rule out. She has transient cocaine induced vasospasms. CXR with no active cardiopulmonary disease. Initial troponin negative and no acute ischemic changes on EKG. 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 CRP 2.7 and ESR 55. WBC 3.3 with baseline around 3. No left shift.  -WOC consultation  Chronic normocytic anemia: likely b/l 9-11. Hemoglobin on admission 11.3. Likely secondary to anemia of chronic disease.  Depression: has not been taking home bupropion 24 hour 150 mg daily or trazodone 573mQHS. Will hold.   HTN: continue home lisinopril 10 mg daily  FEN: regular diet  VTE ppx: Lovenox  Dispo: Disposition is deferred at this time, awaiting improvement of current medical problems. Anticipated discharge in approximately 1-2 day(s).   The patient does have a current PCP (No primary care provider on file.) and [facility].  The patient does not know have transportation limitations that hinder transportation to clinic appointments.  Signed: JeMilagros LollMD  04/21/2015, 12:43 PM

## 2015-04-22 ENCOUNTER — Ambulatory Visit (HOSPITAL_COMMUNITY): Payer: Medicaid Other

## 2015-04-22 DIAGNOSIS — F14288 Cocaine dependence with other cocaine-induced disorder: Secondary | ICD-10-CM | POA: Diagnosis not present

## 2015-04-22 DIAGNOSIS — D638 Anemia in other chronic diseases classified elsewhere: Secondary | ICD-10-CM | POA: Insufficient documentation

## 2015-04-22 DIAGNOSIS — I10 Essential (primary) hypertension: Secondary | ICD-10-CM | POA: Diagnosis not present

## 2015-04-22 DIAGNOSIS — I776 Arteritis, unspecified: Secondary | ICD-10-CM | POA: Insufficient documentation

## 2015-04-22 DIAGNOSIS — R0789 Other chest pain: Secondary | ICD-10-CM | POA: Diagnosis not present

## 2015-04-22 DIAGNOSIS — R079 Chest pain, unspecified: Secondary | ICD-10-CM

## 2015-04-22 DIAGNOSIS — L8990 Pressure ulcer of unspecified site, unspecified stage: Secondary | ICD-10-CM | POA: Diagnosis present

## 2015-04-22 DIAGNOSIS — F17210 Nicotine dependence, cigarettes, uncomplicated: Secondary | ICD-10-CM | POA: Insufficient documentation

## 2015-04-22 DIAGNOSIS — F329 Major depressive disorder, single episode, unspecified: Secondary | ICD-10-CM | POA: Insufficient documentation

## 2015-04-22 DIAGNOSIS — F1410 Cocaine abuse, uncomplicated: Secondary | ICD-10-CM | POA: Insufficient documentation

## 2015-04-22 DIAGNOSIS — H9209 Otalgia, unspecified ear: Secondary | ICD-10-CM | POA: Diagnosis not present

## 2015-04-22 DIAGNOSIS — F1220 Cannabis dependence, uncomplicated: Secondary | ICD-10-CM | POA: Insufficient documentation

## 2015-04-22 DIAGNOSIS — L959 Vasculitis limited to the skin, unspecified: Secondary | ICD-10-CM | POA: Diagnosis not present

## 2015-04-22 DIAGNOSIS — I96 Gangrene, not elsewhere classified: Secondary | ICD-10-CM | POA: Insufficient documentation

## 2015-04-22 LAB — HEMOGLOBIN A1C
HEMOGLOBIN A1C: 5.1 % (ref 4.8–5.6)
Mean Plasma Glucose: 100 mg/dL

## 2015-04-22 MED ORDER — SIMVASTATIN 20 MG PO TABS: 20.0000 mg | ORAL_TABLET | Freq: Every day | ORAL | Status: DC

## 2015-04-22 MED ORDER — SIMVASTATIN 20 MG PO TABS
20.0000 mg | ORAL_TABLET | Freq: Every day | ORAL | Status: DC
  Administered 2015-04-22: 20 mg via ORAL
  Filled 2015-04-22: qty 1

## 2015-04-22 MED ORDER — LIDOCAINE HCL 4 % EX SOLN
Freq: Three times a day (TID) | CUTANEOUS | Status: DC | PRN
  Administered 2015-04-22: 18:00:00 via TOPICAL
  Filled 2015-04-22 (×2): qty 50

## 2015-04-22 MED ORDER — LIDOCAINE HCL 4 % EX SOLN: Freq: Three times a day (TID) | CUTANEOUS | Status: DC | PRN

## 2015-04-22 MED ORDER — NAPROXEN 500 MG PO TABS: 500.0000 mg | ORAL_TABLET | Freq: Two times a day (BID) | ORAL | Status: DC

## 2015-04-22 NOTE — Progress Notes (Signed)
Subjective: No acute events overnight. She feels better overall. Pain improved. No chest pain since arrival to floor. Denies shortness of breath  Objective: Vital signs in last 24 hours: Filed Vitals:   04/21/15 2042 04/22/15 0526 04/22/15 0851 04/22/15 0948  BP: 124/66 138/74 135/64 126/59  Pulse: 81 54 79 80  Temp: 98 F (36.7 C) 97.8 F (36.6 C) 97.7 F (36.5 C)   TempSrc: Oral Oral Oral   Resp: 14 16 16    Height:      Weight:  87 lb 10.5 oz (39.76 kg)    SpO2: 100% 100% 100%    Weight change:   Intake/Output Summary (Last 24 hours) at 04/22/15 1158 Last data filed at 04/22/15 0900  Gross per 24 hour  Intake    360 ml  Output      0 ml  Net    360 ml   General Apperance: NAD HEENT: PERRL, EOMI, anicteric sclera Neck: Supple, trachea midline Lungs: Clear to auscultation bilaterally. No wheezes, rhonchi or rales. Breathing comfortably Heart: Regular rate and rhythm, no murmur/rub/gallop Abdomen: Soft, nontender, nondistended, no rebound/guarding Extremities: Normal, atraumatic, warm and well perfused, no edema Pulses: 2+ throughout Skin: vasculitic changes on legs and arms and face. Necrosis of bilateral ear helices Neurologic: Alert and oriented x 3. CNII-XII intact. Normal strength and sensation  Lab Results: Basic Metabolic Panel:  Recent Labs Lab 04/21/15 1029  NA 134*  K 3.5  CL 100*  CO2 25  GLUCOSE 98  BUN 14  CREATININE 0.73  CALCIUM 8.8*   Liver Function Tests:  Recent Labs Lab 04/21/15 1029  AST 30  ALT 13*  ALKPHOS 61  BILITOT 0.5  PROT 7.4  ALBUMIN 3.5   CBC:  Recent Labs Lab 04/21/15 1118  WBC 3.3*  NEUTROABS 2.3  HGB 11.3*  HCT 33.2*  MCV 86.2  PLT 287   Cardiac Enzymes:  Recent Labs Lab 04/21/15 1029 04/21/15 1608 04/21/15 2225  TROPONINI <0.03 <0.03 <0.03   Hemoglobin A1C:  Recent Labs Lab 04/21/15 1608  HGBA1C 5.1   Fasting Lipid Panel:  Recent Labs Lab 04/21/15 1608  CHOL 185  HDL 50  LDLCALC  125*  TRIG 48  CHOLHDL 3.7   Urine Drug Screen: Drugs of Abuse     Component Value Date/Time   LABOPIA NONE DETECTED 04/21/2015 1120   LABOPIA PPS 08/31/2012 1045   COCAINSCRNUR POSITIVE* 04/21/2015 1120   COCAINSCRNUR NEG 08/31/2012 1045   LABBENZ NONE DETECTED 04/21/2015 1120   LABBENZ NEG 08/31/2012 1045   LABBENZ NEGATIVE 06/12/2011 0221   AMPHETMU NONE DETECTED 04/21/2015 1120   AMPHETMU NEG 08/31/2012 1045   AMPHETMU NEGATIVE 06/12/2011 0221   THCU NONE DETECTED 04/21/2015 1120   THCU NEG 08/31/2012 1045   THCU 63.4* 05/20/2012 1405   LABBARB NONE DETECTED 04/21/2015 1120   LABBARB NEG 08/31/2012 1045    Urinalysis:  Recent Labs Lab 04/21/15 1120  COLORURINE YELLOW  LABSPEC 1.012  PHURINE 7.0  GLUCOSEU NEGATIVE  HGBUR NEGATIVE  BILIRUBINUR NEGATIVE  KETONESUR NEGATIVE  PROTEINUR NEGATIVE  UROBILINOGEN 1.0  NITRITE NEGATIVE  LEUKOCYTESUR NEGATIVE   Studies/Results: Dg Chest 2 View  04/21/2015   CLINICAL DATA:  Productive cough  EXAM: CHEST  2 VIEW  COMPARISON:  04/08/2015  FINDINGS: Cardiomediastinal silhouette is stable. Mild hyperinflation. Nodular nipple shadow is noted bilaterally. No acute infiltrate or pulmonary edema. Stable linear scarring in right upper lobe.  IMPRESSION: No active cardiopulmonary disease. Mild hyperinflation. Nodular nipple shadow is noted  bilaterally.   Electronically Signed   By: Lahoma Crocker M.D.   On: 04/21/2015 11:15   Medications: I have reviewed the patient's current medications. Scheduled Meds: . aspirin EC  81 mg Oral Daily  . enoxaparin (LOVENOX) injection  30 mg Subcutaneous Q24H  . lisinopril  10 mg Oral Daily   Continuous Infusions:  PRN Meds:.ketorolac, lidocaine Assessment/Plan:  Atypical chest pain: She has transient cocaine induced vasospasms. Repeat EKG this morning with anterior T wave inversions. Troponins negative x 3. Hgb A1c 5.1%. Cholesterol 185, HDL 50, LDL 125. 7.7% 10-year risk of heart disease or stroke.    -Cardiology consulted, appreciate recommendations. 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 CRP 2.7 and ESR 55. WBC 3.3 with baseline around 3. No left shift. Appreciate WOC recommendations. Lidocaine gel prn. Foam dressing to left leg wounds to protect and promote healing.  Chronic normocytic anemia: likely b/l 9-11. Hemoglobin on admission 11.3. Likely secondary to anemia of chronic disease.  Depression: has not been taking home bupropion 24 hour 150 mg daily or trazodone 5028mHS. Will hold.   HTN: continue home lisinopril 10 mg daily  FEN: regular diet  VTE ppx: Lovenox  Dispo: Disposition is deferred at this time, awaiting improvement of current medical problems.  Anticipated discharge in approximately 0-1 day(s).   The patient does have a current PCP (No primary care provider on file.) and [facility].  The patient does not know have transportation limitations that hinder transportation to clinic appointments.  .Services Needed at time of discharge: Y = Yes, Blank = No PT:   OT:   RN:   Equipment:   Other:     LOS: 1 day   JenMilagros LollD 04/22/2015, 11:58 AM

## 2015-04-22 NOTE — Consult Note (Addendum)
WOC wound consult note Reason for Consult: Consult requested for bilat ears and left leg wounds.  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 She has been followed by the ortho service prior to admission for left leg wounds, which have greatly improved according to the patient.Topical treatment is minimally effective for this chronic problem. Wound type: Bilat ears with chronic dry eschar, no open wounds, odor, or drainage.   Measurement: Left leg with healing full thickness wounds; .3X.3X.1cm and .5X.5X.1cm.  Both are pink and dry without odor or drainage.   Dressing procedure/placement/frequency: It is best practice to leave dry stable eschar intact to bilat ears.  These locations are very painful according to patient; please order topical lidocaine gel for patient to use PRN if desired. Foam dressing to left leg wounds to protect and promote healing.  Discussed plan of care with patient and she verbalizes understanding. Please re-consult if further assistance is needed.  Thank-you,  Julien Girt MSN, [place], [place], [place], [place]

## 2015-04-22 NOTE — Consult Note (Signed)
CARDIOLOGY CONSULT NOTE   [name] MRN: [id number] DOB/AGE: 06-Jan-1961 54 y.o.  Admit Date: 04/21/2015  Primary Physician: No primary care provider on file.  Primary Cardiologist   [name],  New   Clinical Summary [name] is a 54 y.o.female. She has recurrent admissions with ongoing cocaine abuse. 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 She is admitted with pain in her ear related to her vasculitis. However she has had chest discomfort. Her EKG today does show marked anterior T-wave inversions. This is a change from her most recent EKG. However she has had some of this type of anterior T-wave inversion intermittently in the past. Patient had a nuclear scan in May, 2013. There was no ischemia at that time. Two-dimensional echo in June, 2016 revealed an ejection fraction of 60%. There was a trivial pericardial effusion at that time.   Allergies  Allergen Reactions  . Acetaminophen Swelling    REACTION: eyelid swelling    Medications Scheduled Medications: . aspirin EC  81 mg Oral Daily  . enoxaparin (LOVENOX) injection  30 mg Subcutaneous Q24H  . lisinopril  10 mg Oral Daily     Infusions:     PRN Medications:  ketorolac   Past Medical History  Diagnosis Date  . 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 Followed by [name]  . Hypertension   . Cocaine abuse     ongoing with resultant vaculitis.  . Depression   . Normocytic anemia     BL Hgb 9.8-12. Last anemia panel 04/2010 - showing Fe 19, ferritin 101.  Pt on monthly B12 injections  . 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 CAP (community acquired pneumonia) 03/2014 X 2  . Daily headache   . Migraines     "probably 5-6/yr" (04/21/2014)  . Rheumatoid arthritis(714.0)     patient reported  . Inflammatory arthritis     Past Surgical History  Procedure Laterality Date  . Skin biopsy Bilateral 04/2010    shin nodules  . Irrigation and debridement abscess  Bilateral 09/26/2013    Procedure: DEBRIDEMENT ULCERS BILATERAL THIGHS;  Surgeon: Cristina Ober, MD;  Location: [place];  Service: General;  Laterality: Bilateral;  . Hernia repair      "stomach"    Family History  Problem Relation Age of Onset  . Breast cancer Mother     Breast cancer  . Alcohol abuse Mother   . Colon cancer Maternal Aunt 66  . Alcohol abuse Father     Social History [name] reports that she has been smoking Cigarettes.  She has a 10.2 pack-year smoking history. She has never used smokeless tobacco. [name] reports that she does not drink alcohol.  Review of Systems At this point the patient denies fever, chills, headache, sweats, change in vision, change in hearing, cough, nausea vomiting, urinary symptoms. She has chronic skin changes related to her vasculitis. She is having continued ear pain today. All other systems are reviewed and are negative.  Physical Examination Blood pressure 135/64, pulse 79, temperature 97.7 F (36.5 C), temperature source Oral, resp. rate 16, height 5' 1"  (1.549 m), weight 87 lb 10.5 oz (39.76 kg), SpO2 100 %.  Intake/Output Summary (Last 24 hours) at 04/22/15 0932 Last data filed at 04/22/15 0900  Gross per 24 hour  Intake    360 ml  Output      0 ml  Net  360 ml   Patient is oriented to person time and place. Affect is normal. She has poor dentition and may be  edentulous. She is cachectic. She has discoloration of her ears. She has multiple skin lesions. There is no jugular venous distention. She is lying flat in bed without shortness of breath. There are no carotid bruits. Lungs reveal a few scattered rhonchi. Cardiac exam reveals an S1 and S2. Abdomen is soft. There is no peripheral edema. She has multiple skin lesions as described. There are no musculoskeletal deformities. Neurologic is grossly intact.  Prior Cardiac Testing/Procedures  Lab Results  Basic Metabolic Panel:  Recent Labs Lab 04/21/15 1029  NA 134*  K  3.5  CL 100*  CO2 25  GLUCOSE 98  BUN 14  CREATININE 0.73  CALCIUM 8.8*    Liver Function Tests:  Recent Labs Lab 04/21/15 1029  AST 30  ALT 13*  ALKPHOS 61  BILITOT 0.5  PROT 7.4  ALBUMIN 3.5    CBC:  Recent Labs Lab 04/21/15 1118  WBC 3.3*  NEUTROABS 2.3  HGB 11.3*  HCT 33.2*  MCV 86.2  PLT 287    Cardiac Enzymes:  Recent Labs Lab 04/21/15 1029 04/21/15 1608 04/21/15 2225  TROPONINI <0.03 <0.03 <0.03    BNP: Invalid input(s): POCBNP   Radiology: Dg Chest 2 View  04/21/2015   CLINICAL DATA:  Productive cough  EXAM: CHEST  2 VIEW  COMPARISON:  04/08/2015  FINDINGS: Cardiomediastinal silhouette is stable. Mild hyperinflation. Nodular nipple shadow is noted bilaterally. No acute infiltrate or pulmonary edema. Stable linear scarring in right upper lobe.  IMPRESSION: No active cardiopulmonary disease. Mild hyperinflation. Nodular nipple shadow is noted bilaterally.   Electronically Signed   By: [name] M.D.   On: 04/21/2015 11:15     ECG:  I reviewed current and old EKGs. Currently she has mild diffuse J-point elevation. There is bradycardia. There is anterior T-wave inversion. These changes are new since yesterday. However she has had them intermittently in the past.   Impression and Recommendations    Essential hypertension    Blood pressures controlled.    Vasculitis    She is having recurrent ear pain from her vasculitis.    Cannabis use disorder, severe, dependence     Unfortunately this represents a severe problem for the patient. Her current chest pain is probably related to her cocaine use.    Skin necrosis    Other chest pain     Cardiac enzymes are normal. At this point there is no definite proof of an acute coronary syndrome. Her EKG change today is remarkable. However she has had this type of changes in the past. I've ordered a two-dimensional echo. We know she had good LV function in June, 2016. If she has significant change in LV  function, further workup may be considered. I would not recommend any other further workup yet. Unfortunately, cocaine is running this program. Unless there is a definite pressing reason, I would not consider cardiac catheterization.    Pressure ulcer  Cristina November, MD 04/22/2015, 9:32 AM

## 2015-04-22 NOTE — Progress Notes (Signed)
Chaplain responded to nurse request   Pt: not available today, did request I come back tomorrow. She just received treatments and was a little sleepy.   04/22/15 1700  Clinical Encounter Type  Visited With Patient not available;Health care provider  Visit Type Initial;Spiritual support  Referral From Nurse  Consult/Referral To Chaplain  Spiritual Encounters  Spiritual Needs Emotional

## 2015-04-22 NOTE — Progress Notes (Signed)
Pt being discharged home by bus. Pt alert and oriented x4. VSS. Pt c/o pain around necrosis of ears. PRN pain meds given prior to discharge. No signs of respiratory distress. Education complete and care plans resolved. IV removed with catheter intact and pt tolerated well. No further issues at this time. Pt to follow up with PCP. Leanne Chang, RN

## 2015-04-22 NOTE — Progress Notes (Signed)
Echocardiogram 2D Echocardiogram has been performed.  Joelene Millin 04/22/2015, 2:54 PM

## 2015-04-23 NOTE — Discharge Summary (Signed)
Name: [name] MRN: [id number] DOB: [birth year] 54 y.o. PCP: Zada Finders, MD  Date of Admission: 04/21/2015  9:54 AM Date of Discharge: 04/22/2015 Attending Physician: Aldine Contes, MD  Discharge Diagnosis: Principal Problem:   Other chest pain Active Problems:   Vasculitis   Skin necrosis   Pressure ulcer  Discharge Medications:   Medication List    STOP taking these medications        buPROPion 150 MG 24 hr tablet  Commonly known as:  WELLBUTRIN XL     traZODone 50 MG tablet  Commonly known as:  DESYREL      TAKE these medications        aspirin 81 MG EC tablet  Take 1 tablet (81 mg total) by mouth daily.     lidocaine 4 % external solution  Commonly known as:  XYLOCAINE  Apply topically 3 (three) times daily as needed for moderate pain.     lisinopril 10 MG tablet  Commonly known as:  PRINIVIL,ZESTRIL  Take 1 tablet (10 mg total) by mouth daily.     naproxen 500 MG tablet  Commonly known as:  NAPROSYN  Take 1 tablet (500 mg total) by mouth 2 (two) times daily with a meal.     simvastatin 20 MG tablet  Commonly known as:  ZOCOR  Take 1 tablet (20 mg total) by mouth daily at 6 PM.        Disposition and follow-up:   [name] was discharged from [facility] in Stable condition.  At the hospital follow up visit please address:  1.  Atypical chest pain: She was continued on ASA 59m daily and started on simvastatin 258mQHS. Check lipid panel in 6-12 weeks for response of statin.  Levamisole vasculitis: Counseled on cocaine cessation.  Depression: She has not been taking home bupropion 24 hour 150 mg daily or trazodone 5074mHS. These medications were held. She will need to be reevaluated either by behavioral health or her PCP for this.  2.  Labs / imaging needed at time of follow-up: none  3.  Pending labs/ test needing follow-up: none  Follow-up Appointments:     Follow-up Information    Follow up with VisZada FindersD On 04/29/2015.   Specialty:  Internal Medicine   Why:  2:15PM for hospital follow up   Contact information:   [address] [phone]    Discharge Instructions: Discharge Instructions    Call MD for:  difficulty breathing, headache or visual disturbances    Complete by:  As directed      Call MD for:  persistant dizziness or light-headedness    Complete by:  As directed      Call MD for:  persistant nausea and vomiting    Complete by:  As directed      Call MD for:  severe uncontrolled pain    Complete by:  As directed      Call MD for:  temperature >100.4    Complete by:  As directed      Diet - low sodium heart healthy    Complete by:  As directed      Increase activity slowly    Complete by:  As directed            Consultations: Treatment Team:  Rounding Lbcardiology, MD  Procedures Performed:  Dg Chest 2 View  04/21/2015   CLINICAL DATA:  Productive  cough  EXAM: CHEST  2 VIEW  COMPARISON:  04/08/2015  FINDINGS: Cardiomediastinal silhouette is stable. Mild hyperinflation. Nodular nipple shadow is noted bilaterally. No acute infiltrate or pulmonary edema. Stable linear scarring in right upper lobe.  IMPRESSION: No active cardiopulmonary disease. Mild hyperinflation. Nodular nipple shadow is noted bilaterally.   Electronically Signed   By: Lahoma Crocker M.D.   On: 04/21/2015 11:15   Dg Chest Port 1 View  04/08/2015   CLINICAL DATA:  Acute onset of left-sided chest pain yesterday. Current history of hypertension. Nonsmoker.  EXAM: PORTABLE CHEST - 1 VIEW  COMPARISON:  10/09/2014 and earlier.  FINDINGS: Cardiac silhouette upper normal in size for AP portable technique, unchanged. Thoracic aorta mildly atherosclerotic, unchanged. Hilar and mediastinal contours otherwise unremarkable. Linear scarring in the right upper lobe, unchanged. Lungs otherwise clear. No localized airspace consolidation. No pleural effusions. No pneumothorax. Normal  pulmonary vascularity.  IMPRESSION: Stable scarring in the right upper lobe. No acute cardiopulmonary disease.   Electronically Signed   By: Evangeline Dakin M.D.   On: 04/08/2015 09:26    2D Echo:  Study Conclusions  - Left ventricle: The cavity size was normal. Systolic function was normal. The estimated ejection fraction was in the range of 60% to 65%. Wall motion was normal; there were no regional wall motion abnormalities. - Aortic valve: Trileaflet; mildly thickened, mildly calcified leaflets. - Left atrium: The atrium was mildly dilated. - Tricuspid valve: There was trivial regurgitation. - Pulmonic valve: There was trivial regurgitation.  Transthoracic echocardiography. M-mode, complete 2D, spectral Doppler, and color Doppler. Birthdate: Patient birthdate: 26-May-1961. Age: Patient is 54 yr old. Sex: Gender: female. BMI: 16.4 kg/m^2. Blood pressure:   109/67 Patient status: Inpatient. Study date: Study date: 04/22/2015. Study time: 02:21 PM. Location: Bedside.   Admission HPI: [name] is a 54 year old woman with history of cocaine abuse complicated by a levamisole vasculitis, hypertension, and depression presenting with bilateral ear pain. She reports she last used cocaine on Friday. She noticed last night that she was having bilateral ear pain and necrosis. She reports this is similar to previous lavamisole vasculitis episodes. She is followed by wound care clinic at [facility]. She reports the wound on the dorsal aspect of her foot is healing well. She has a new superficial wound on the posterior ankle. She reports subjective fevers. Her cough is productive of yellow sputum. She has nausea but no emesis. Denies abdominal pain or diarrhea, dysuria, hematuria.  She had mild chest pain earlier that is located mid sternum. It occurs with coughing and worsened by deep breaths. It is an aching pain that is intermittent.   She was last hospitalized  04/07/2015-04/08/2015 for chest pain and L axilla folliculitis. She completed her antibiotics and reports resolution of the folliculitis.  Hospital Course by problem list: Active Problems:   Essential hypertension   Vasculitis   Cannabis use disorder, severe, dependence   Skin necrosis   Other chest pain   Pressure ulcer   1. Atypical chest pain: Mild chest pain associated with cough. Previous admission with negative ACS rule out. She has transient cocaine induced vasospasms. CXR with no active cardiopulmonary disease. Initial troponin negative and no acute ischemic changes on EKG. She was admitted for further evaluation. Repeat EKG this morning with anterior T wave inversions. Troponins negative x 3. Hgb A1c 5.1%. Cholesterol 185, HDL 50, LDL 125. 7.7% 10-year risk of heart disease or stroke. She was seen in consultation by cardiology who recommended TTE.  She was continued on ASA 51m daily and started on simvastatin 226mQHS. Echo was unremarkable. At discharge, she had no further episodes of chest pain. She was discharged with instructions to follow up with the internal medicine clinic.  2. 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 CRP 2.7 and ESR 55. WBC 3.3 with baseline around 3. No left shift. [facility] recommended Lidocaine gel prn. Foam dressing to left leg wounds to protect and promote healing. She was counseled on cocaine cessation.  3. Depression: She has not been taking home bupropion 24 hour 150 mg daily or trazodone 5035mHS. These medications were held. She will need to be reevaluated either by behavioral health or her PCP for this.  Discharge Vitals:   BP 127/67 mmHg  Pulse 72  Temp(Src) 98.1 F (36.7 C) (Oral)  Resp 17  Ht 5' 1" (1.549 m)  Wt 87 lb 10.5 oz (39.76 kg)  BMI 16.57 kg/m2  SpO2 100%  Discharge Labs:  Results for orders placed or performed during the hospital encounter of 04/21/15 (from the past 72  hour(s))  Comprehensive metabolic panel     Status: Abnormal   Collection Time: 04/21/15 10:29 AM  Result Value Ref Range   Sodium 134 (L) 135 - 145 mmol/L   Potassium 3.5 3.5 - 5.1 mmol/L   Chloride 100 (L) 101 - 111 mmol/L   CO2 25 22 - 32 mmol/L   Glucose, Bld 98 65 - 99 mg/dL   BUN 14 6 - 20 mg/dL   Creatinine, Ser 0.73 0.44 - 1.00 mg/dL   Calcium 8.8 (L) 8.9 - 10.3 mg/dL   Total Protein 7.4 6.5 - 8.1 g/dL   Albumin 3.5 3.5 - 5.0 g/dL   AST 30 15 - 41 U/L   ALT 13 (L) 14 - 54 U/L   Alkaline Phosphatase 61 38 - 126 U/L   Total Bilirubin 0.5 0.3 - 1.2 mg/dL   GFR calc non Af Amer >60 >60 mL/min   GFR calc Af Amer >60 >60 mL/min    Comment: (NOTE) The eGFR has been calculated using the CKD EPI equation. This calculation has not been validated in all clinical situations. eGFR's persistently <60 mL/min signify possible Chronic Kidney Disease.    Anion gap 9 5 - 15  Troponin I     Status: None   Collection Time: 04/21/15 10:29 AM  Result Value Ref Range   Troponin I <0.03 <0.031 ng/mL    Comment:        NO INDICATION OF MYOCARDIAL INJURY.   Sedimentation rate     Status: Abnormal   Collection Time: 04/21/15 11:18 AM  Result Value Ref Range   Sed Rate 55 (H) 0 - 22 mm/hr  C-reactive protein     Status: Abnormal   Collection Time: 04/21/15 11:18 AM  Result Value Ref Range   CRP 2.7 (H) <1.0 mg/dL  CBC with Differential     Status: Abnormal   Collection Time: 04/21/15 11:18 AM  Result Value Ref Range   WBC 3.3 (L) 4.0 - 10.5 K/uL   RBC 3.85 (L) 3.87 - 5.11 MIL/uL   Hemoglobin 11.3 (L) 12.0 - 15.0 g/dL   HCT 33.2 (L) 36.0 - 46.0 %   MCV 86.2 78.0 - 100.0 fL   MCH 29.4 26.0 - 34.0 pg   MCHC 34.0 30.0 - 36.0 g/dL   RDW 13.0 11.5 - 15.5 %   Platelets 287 150 -  400 K/uL   Neutrophils Relative % 69 43 - 77 %   Neutro Abs 2.3 1.7 - 7.7 K/uL   Lymphocytes Relative 27 12 - 46 %   Lymphs Abs 0.9 0.7 - 4.0 K/uL   Monocytes Relative 2 (L) 3 - 12 %   Monocytes Absolute 0.1  0.1 - 1.0 K/uL   Eosinophils Relative 2 0 - 5 %   Eosinophils Absolute 0.1 0.0 - 0.7 K/uL   Basophils Relative 0 0 - 1 %   Basophils Absolute 0.0 0.0 - 0.1 K/uL  Urinalysis, Routine w reflex microscopic (not at [facility])     Status: None   Collection Time: 04/21/15 11:20 AM  Result Value Ref Range   Color, Urine YELLOW YELLOW   APPearance CLEAR CLEAR   Specific Gravity, Urine 1.012 1.005 - 1.030   pH 7.0 5.0 - 8.0   Glucose, UA NEGATIVE NEGATIVE mg/dL   Hgb urine dipstick NEGATIVE NEGATIVE   Bilirubin Urine NEGATIVE NEGATIVE   Ketones, ur NEGATIVE NEGATIVE mg/dL   Protein, ur NEGATIVE NEGATIVE mg/dL   Urobilinogen, UA 1.0 0.0 - 1.0 mg/dL   Nitrite NEGATIVE NEGATIVE   Leukocytes, UA NEGATIVE NEGATIVE    Comment: MICROSCOPIC NOT DONE ON URINES WITH NEGATIVE PROTEIN, BLOOD, LEUKOCYTES, NITRITE, OR GLUCOSE <1000 mg/dL.  Urine rapid drug screen (hosp performed)     Status: Abnormal   Collection Time: 04/21/15 11:20 AM  Result Value Ref Range   Opiates NONE DETECTED NONE DETECTED   Cocaine POSITIVE (A) NONE DETECTED   Benzodiazepines NONE DETECTED NONE DETECTED   Amphetamines NONE DETECTED NONE DETECTED   Tetrahydrocannabinol NONE DETECTED NONE DETECTED   Barbiturates NONE DETECTED NONE DETECTED    Comment:        DRUG SCREEN FOR MEDICAL PURPOSES ONLY.  IF CONFIRMATION IS NEEDED FOR ANY PURPOSE, NOTIFY LAB WITHIN 5 DAYS.        LOWEST DETECTABLE LIMITS FOR URINE DRUG SCREEN Drug Class       Cutoff (ng/mL) Amphetamine      1000 Barbiturate      200 Benzodiazepine   497 Tricyclics       026 Opiates          300 Cocaine          300 THC              50   Troponin I     Status: None   Collection Time: 04/21/15  4:08 PM  Result Value Ref Range   Troponin I <0.03 <0.031 ng/mL    Comment:        NO INDICATION OF MYOCARDIAL INJURY.   Hemoglobin A1c     Status: None   Collection Time: 04/21/15  4:08 PM  Result Value Ref Range   Hgb A1c MFr Bld 5.1 4.8 - 5.6 %    Comment:  (NOTE)         Pre-diabetes: 5.7 - 6.4         Diabetes: >6.4         Glycemic control for adults with diabetes: <7.0    Mean Plasma Glucose 100 mg/dL    Comment: (NOTE) Performed At: [facility] [place], [place] [id number] Lindon Romp MD DX:[id number]   Lipid panel     Status: Abnormal   Collection Time: 04/21/15  4:08 PM  Result Value Ref Range   Cholesterol 185 0 - 200 mg/dL   Triglycerides 48 <150 mg/dL   HDL 50 >40  mg/dL   Total CHOL/HDL Ratio 3.7 RATIO   VLDL 10 0 - 40 mg/dL   LDL Cholesterol 125 (H) 0 - 99 mg/dL    Comment:        Total Cholesterol/HDL:CHD Risk Coronary Heart Disease Risk Table                     Men   Women  1/2 Average Risk   3.4   3.3  Average Risk       5.0   4.4  2 X Average Risk   9.6   7.1  3 X Average Risk  23.4   11.0        Use the calculated Patient Ratio above and the CHD Risk Table to determine the patient's CHD Risk.        ATP III CLASSIFICATION (LDL):  <100     mg/dL   Optimal  100-129  mg/dL   Near or Above                    Optimal  130-159  mg/dL   Borderline  160-189  mg/dL   High  >190     mg/dL   Very High   Troponin I     Status: None   Collection Time: 04/21/15 10:25 PM  Result Value Ref Range   Troponin I <0.03 <0.031 ng/mL    Comment:        NO INDICATION OF MYOCARDIAL INJURY.     Signed: Milagros Loll, MD 04/23/2015, 8:54 AM    Services Ordered on Discharge: None Equipment Ordered on Discharge: None

## 2015-04-27 IMAGING — CR DG CHEST 1V PORT
1 series · 1 of 1 positions shown · non-contrast
Comparison: Chest radiograph performed 02/18/2013

CLINICAL DATA: Chest pain.

PORTABLE CHEST - 1 VIEW

[AP]
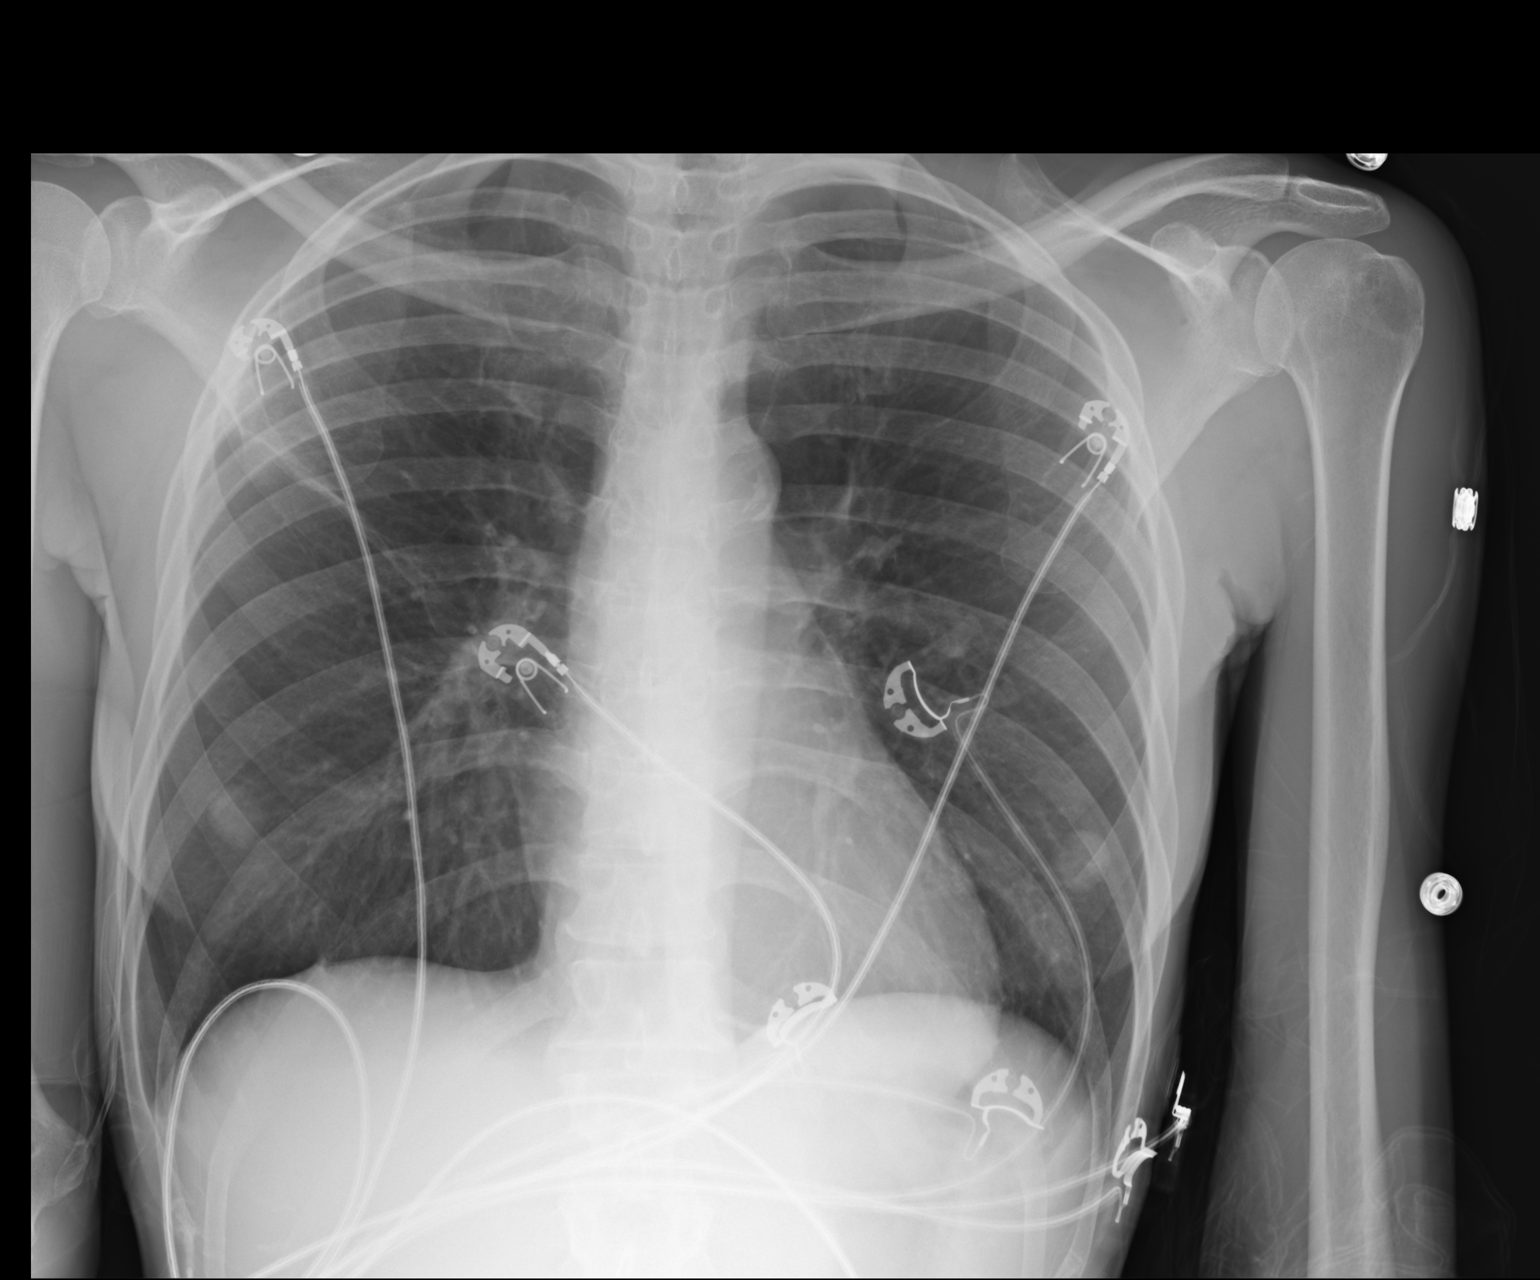

[1 of 1 positions shown; findings below may reference images not displayed]

FINDINGS: The lungs are well-aerated and clear.  There is no
evidence of focal opacification, pleural effusion or pneumothorax.
Bilateral nipple shadows are noted.

The cardiomediastinal silhouette is normal in size; calcification
is noted in the aortic arch.  No acute osseous abnormalities are
seen.
IMPRESSION: No acute cardiopulmonary process seen.

## 2015-04-29 ENCOUNTER — Ambulatory Visit (INDEPENDENT_AMBULATORY_CARE_PROVIDER_SITE_OTHER): Payer: Medicaid Other | Admitting: Internal Medicine

## 2015-04-29 ENCOUNTER — Encounter: Payer: Self-pay | Admitting: Internal Medicine

## 2015-04-29 ENCOUNTER — Other Ambulatory Visit (HOSPITAL_COMMUNITY)
Admission: RE | Admit: 2015-04-29 | Discharge: 2015-04-29 | Disposition: A | Discharge status: Home / Self Care | Payer: Medicaid Other | Source: Ambulatory Visit | Attending: Student | Admitting: Student

## 2015-04-29 VITALS — BP 137/68 | HR 84 | Temp 98.1°F | Wt 92.2 lb

## 2015-04-29 DIAGNOSIS — Z113 Encounter for screening for infections with a predominantly sexual mode of transmission: Secondary | ICD-10-CM | POA: Diagnosis present

## 2015-04-29 DIAGNOSIS — F14988 Cocaine use, unspecified with other cocaine-induced disorder: Secondary | ICD-10-CM

## 2015-04-29 DIAGNOSIS — R300 Dysuria: Secondary | ICD-10-CM

## 2015-04-29 DIAGNOSIS — F14288 Cocaine dependence with other cocaine-induced disorder: Secondary | ICD-10-CM | POA: Diagnosis not present

## 2015-04-29 DIAGNOSIS — L959 Vasculitis limited to the skin, unspecified: Secondary | ICD-10-CM | POA: Diagnosis present

## 2015-04-29 DIAGNOSIS — I999 Unspecified disorder of circulatory system: Secondary | ICD-10-CM

## 2015-04-29 LAB — POCT URINALYSIS DIPSTICK
Glucose, UA: NEGATIVE
Ketones, UA: NEGATIVE
Leukocytes, UA: NEGATIVE
NITRITE UA: NEGATIVE
PROTEIN UA: 30
Spec Grav, UA: >=1.03
Urobilinogen, UA: 0.2
pH, UA: 5.5

## 2015-04-29 NOTE — Patient Instructions (Signed)
Thank you for your visit.  We are checking blood to evaluate for your burning with urination.  Please try to avoid scratching your wounds and sores.  Please follow up with wound care for you foot as well as your ears.

## 2015-04-29 NOTE — Assessment & Plan Note (Signed)
Patient admits to using cocaine 3 days ago. She has complaints of returning pain to bilateral ears with necrosis. She has new ulcers on left buttock, left shoulder, and groin just inferior to vaginal area that appear to be scratch wounds. There is no obvious signs of infection. She states that she is seeing wound care in two days.  -Continue to see wound care clinic. Patient advised to notify wound care of ear lesions as well as new ulcers. -Patient advised to avoid scratching skin, given band-aids to cover excoriations and allow to heal.

## 2015-04-29 NOTE — Progress Notes (Signed)
Internal Medicine Clinic Attending  I saw and evaluated the patient.  I personally confirmed the key portions of the history and exam documented by Dr. Zada Finders and I reviewed pertinent patient test results.  The assessment, diagnosis, and plan were formulated together and I agree with the documentation in the resident's note.  54 year old cocaine user, with levamisole vasculitis and ear deformity, excoriations in different stages of healing all over her body comes in with complaint dysuria and urinary discharge for 2 days. Exam - fresh excoriation likely due to scratching on left buttock, right groin fold, and left shoulder. Does not appear infected. Dipstick for urine negative for infection. Patient vague about excoriation history; crying has no money. Wet prep, GC, UA with reflex cultures. It is possible that dysuria is due to burning pain due to urine touching the wound. Patient unable to distinguish.  Madilyn Fireman MD MPH 04/29/2015 4:35 PM

## 2015-04-29 NOTE — Progress Notes (Signed)
[name], female   DOB: Feb 28, 1961, 54 y.o.   MRN: [id number]   Subjective:   [name]   DOB: 10/02/1961 54 y.o.   MRN: [id number]  HPI: [name] is a 54 y.o.  Female with PMH of cocaine abuse with levamisole vasculitis, HTN, and depression presents with complaints of dysuria and skin sores.  Patient states that she has a burning sensation with urination for the last 2-3 days. She states that urine is light yellow to dark brown color without blood. She states that she noticed some yellow discharge that is malodorous and has some pruritis. Patient also has numerous healed scars all over her body and complains of new sores on her buttocks and near her vaginal area. She states that these sores occurred because of her cocaine use. She admits to using cocaine as recent as Friday, 3 days ago. She also has complaints of returning pain to her ears which have necrosis. She has a healing sore on her right ankle which will be seen by wound care on Wednesday.    Past Medical History  Diagnosis Date  . 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 Followed by Dr. Louanne Skye  . Hypertension   . Cocaine abuse     ongoing with resultant vaculitis.  . Depression   . Normocytic anemia     BL Hgb 9.8-12. Last anemia panel 04/2010 - showing Fe 19, ferritin 101.  Pt on monthly B12 injections  . 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 CAP (community acquired pneumonia) 03/2014 X 2  . Daily headache   . Migraines     "probably 5-6/yr" (04/21/2014)  . Rheumatoid arthritis(714.0)     patient reported  . Inflammatory arthritis    Current Outpatient Prescriptions  Medication Sig Dispense Refill  . aspirin EC 81 MG EC tablet Take 1 tablet (81 mg total) by mouth daily. (Patient not taking: Reported on 04/21/2015) 30 tablet 0  . lidocaine (XYLOCAINE) 4 % external solution Apply topically 3 (three) times daily as needed for moderate pain. 50 mL 0  .  lisinopril (PRINIVIL,ZESTRIL) 10 MG tablet Take 1 tablet (10 mg total) by mouth daily. 30 tablet 0  . naproxen (NAPROSYN) 500 MG tablet Take 1 tablet (500 mg total) by mouth 2 (two) times daily with a meal. 28 tablet 0  . simvastatin (ZOCOR) 20 MG tablet Take 1 tablet (20 mg total) by mouth daily at 6 PM. 30 tablet 0   No current facility-administered medications for this visit.   Family History  Problem Relation Age of Onset  . Breast cancer Mother     Breast cancer  . Alcohol abuse Mother   . Colon cancer Maternal Aunt 3  . Alcohol abuse Father    History   Social History  . Marital Status: Single    Spouse Name: N/A  . Number of Children: 0  . Years of Education: 11th grade   Occupational History  . Disability     since 2011, due to her rheumatoid arthritis   Social History Main Topics  . Smoking status: Current Every Day Smoker -- 0.30 packs/day for 34 years    Types: Cigarettes  . Smokeless tobacco: Never Used     Comment: "Trying very hard".  . Alcohol Use: No  . Drug Use: Yes    Special: "Crack" cocaine, Cocaine     Comment: States she still  uses crack "a little bit".  . Sexual Activity: Not on file   Other Topics Concern  . Not on file   Social History Narrative   Unemployed:  cleaning in past   Living at [place]; has Medicaid.   crack/cocaine use; pt denies IVDU   tobacco:  1/2 ppd, trying to quit   alcohol:  none       Review of Systems: Review of Systems  Constitutional: Negative for fever and chills.  HENT: Positive for ear pain.   Respiratory: Negative for shortness of breath.   Cardiovascular: Negative for chest pain, palpitations and leg swelling.  Gastrointestinal: Negative for heartburn, nausea, vomiting, abdominal pain, diarrhea, constipation and blood in stool.  Genitourinary: Positive for dysuria. Negative for urgency, frequency and hematuria.  Musculoskeletal: Positive for joint pain.  Skin: Positive for itching and rash.    Neurological: Negative for headaches.    Objective:  Physical Exam: Filed Vitals:   04/29/15 1516  BP: 137/68  Pulse: 84  Temp: 98.1 F (36.7 C)  TempSrc: Oral  Weight: 92 lb 3.2 oz (41.822 kg)  SpO2: 100%   Physical Exam  Constitutional: She is oriented to person, place, and time.  Thin appearance, depressed mood. She is teary-eyed.  HENT:  Bilateral outer ears have areas of skin necrosis.  Cardiovascular: Normal rate and regular rhythm.   Genitourinary:  External exam shows new excoriation at inferior right groin fold. No drainage or obvious signs of infection. Tenderness with minimal palpation. No discharge.  Musculoskeletal: Normal range of motion.  Neurological: She is alert and oriented to person, place, and time.  Skin:  Numerous maculopapular scars all over the body. New excoriations on left shoulder and left buttocks. No drainage, pus, or obvious signs of infection.    Assessment & Plan:  Please see problem list for current assessment and plan.

## 2015-04-29 NOTE — Assessment & Plan Note (Signed)
Patient has complaint of dysuria for 2-3 days with some yellow discharge that is malodorous and pruritic. She denies any hematuria, urgency, or frequency. She also has a new excoriation/ulcer just inferior to vaginal area. Patient is tender to area with minimal palpation. There is no drainage or obvious sign of infection.  Urine dipstick is negative for nitrites and leukocytes.  Her burning sensation with urination may be due to irritation of ulcer in genital area, possible UTI but difficult to assess as patient is poor historian.  -Check UA w/ reflex microscopy -Urine for GC/Chlamydia -Patient refused wet prep today

## 2015-04-30 LAB — URINALYSIS, ROUTINE W REFLEX MICROSCOPIC
GLUCOSE, UA: NEGATIVE mg/dL
Hgb urine dipstick: NEGATIVE
KETONES UR: NEGATIVE mg/dL
LEUKOCYTES UA: NEGATIVE
Nitrite: NEGATIVE
PH: 5.5 (ref 5.0–8.0)
Protein, ur: NEGATIVE mg/dL
Specific Gravity, Urine: 1.029 (ref 1.005–1.030)
UROBILINOGEN UA: 1 mg/dL (ref 0.0–1.0)

## 2015-04-30 LAB — URINE CYTOLOGY ANCILLARY ONLY
Chlamydia: NEGATIVE
NEISSERIA GONORRHEA: NEGATIVE

## 2015-05-21 ENCOUNTER — Encounter (HOSPITAL_BASED_OUTPATIENT_CLINIC_OR_DEPARTMENT_OTHER): Payer: Medicaid Other | Attending: General Surgery

## 2015-05-21 ENCOUNTER — Ambulatory Visit: Payer: Self-pay | Admitting: Internal Medicine

## 2015-05-21 DIAGNOSIS — M069 Rheumatoid arthritis, unspecified: Secondary | ICD-10-CM | POA: Insufficient documentation

## 2015-05-21 DIAGNOSIS — T405X4A Poisoning by cocaine, undetermined, initial encounter: Secondary | ICD-10-CM | POA: Diagnosis not present

## 2015-05-21 DIAGNOSIS — D649 Anemia, unspecified: Secondary | ICD-10-CM | POA: Insufficient documentation

## 2015-05-21 DIAGNOSIS — M1990 Unspecified osteoarthritis, unspecified site: Secondary | ICD-10-CM | POA: Diagnosis not present

## 2015-05-21 DIAGNOSIS — F17210 Nicotine dependence, cigarettes, uncomplicated: Secondary | ICD-10-CM | POA: Diagnosis not present

## 2015-05-21 DIAGNOSIS — S91302A Unspecified open wound, left foot, initial encounter: Secondary | ICD-10-CM | POA: Insufficient documentation

## 2015-05-21 DIAGNOSIS — M310 Hypersensitivity angiitis: Secondary | ICD-10-CM | POA: Diagnosis not present

## 2015-05-21 DIAGNOSIS — I10 Essential (primary) hypertension: Secondary | ICD-10-CM | POA: Insufficient documentation

## 2015-05-21 DIAGNOSIS — X58XXXA Exposure to other specified factors, initial encounter: Secondary | ICD-10-CM | POA: Insufficient documentation

## 2015-05-23 ENCOUNTER — Encounter: Payer: Self-pay | Admitting: Internal Medicine

## 2015-05-23 ENCOUNTER — Ambulatory Visit (INDEPENDENT_AMBULATORY_CARE_PROVIDER_SITE_OTHER): Payer: Medicaid Other | Admitting: Internal Medicine

## 2015-05-23 VITALS — BP 132/79 | HR 95 | Temp 98.3°F | Wt 93.5 lb

## 2015-05-23 DIAGNOSIS — F14988 Cocaine use, unspecified with other cocaine-induced disorder: Secondary | ICD-10-CM | POA: Diagnosis present

## 2015-05-23 DIAGNOSIS — I999 Unspecified disorder of circulatory system: Secondary | ICD-10-CM

## 2015-05-23 NOTE — Progress Notes (Signed)
Medicine attending: I personally interviewed and briefly examined this patient, and reviewed pertinent clinical laboratory  data  with resident physician Dr.Carly Rivet and we discussed a   management plan.

## 2015-05-23 NOTE — Progress Notes (Signed)
   Subjective:    [name], female    DOB: 18-Oct-1960, 54 y.o.   MRN: [id number]  HPI [name] is a 54yo woman with PMHx of HTN, levamisole-induced vasculitis, and depression who presents today for an acute visit.   Patient is very well known to me. Patient reports she has new vasculitis lesions on her umbilicus and lower left leg. She states they appeared about 1 week ago. She admits to last using cocaine about 3 days ago. She has been going to the wound clinic for help with her wounds. She denies fevers, chills, and nausea.    Review of Systems General: Denies night sweats, changes in weight, changes in appetite HEENT: Denies headaches, ear pain, changes in vision, rhinorrhea, sore throat CV: Denies CP, palpitations, SOB, orthopnea Pulm: Denies SOB, cough, wheezing GI: Denies abdominal pain, nausea, vomiting, diarrhea, constipation, melena, hematochezia GU: Denies dysuria, hematuria, frequency Msk: Denies muscle cramps, joint pains Neuro: Denies weakness, numbness, tingling Skin: See HPI     Objective:   Physical Exam General: thin-appearing woman, tearful HEENT: [place]/AT, EOMI, sclera anicteric, mucus membranes moist CV: RRR, no m/g/r Pulm: CTA bilaterally, breaths non-labored Abd: BS+, soft, minimal tenderness to palpation near new necrotic lesion on inferior umbilicus Ext: warm, no peripheral edema Neuro: alert and oriented x 3, no focal deficits Skin: Multiple chronic-appearing maculopapular lesions all over her body. She has a new necrotic appearing lesion on the inferior portion of her umbilicus. No drainage or erythema. There are 3 new open lesions that have granular tissue on her lower left leg around the ankle. No obvious drainage or evidence of infection.      Assessment & Plan:  Refer to A&P documentation.

## 2015-05-23 NOTE — Patient Instructions (Signed)
-   Continue keeping your leg wounds clean with new dressings every 2-3 days. Wash with soap and water.  - The most important thing for you to do is to STOP USING COCAINE. This is what is causing your wounds to form.   General Instructions:   Please bring your medicines with you each time you come to clinic.  Medicines may include prescription medications, over-the-counter medications, herbal remedies, eye drops, vitamins, or other pills.   Progress Toward Treatment Goals:  Treatment Goal 03/15/2015  Blood pressure at goal  Stop smoking smoking less    Self Care Goals & Plans:  Self Care Goal 04/29/2015  Manage my medications take my medicines as prescribed; bring my medications to every visit  Monitor my health -  Eat healthy foods eat foods that are low in salt; eat baked foods instead of fried foods  Be physically active find an activity I enjoy  Stop smoking go to the [employer] (https://[facility].info/); call [facility] (1-800-QUIT-NOW)  Meeting treatment goals -    No flowsheet data found.   Care Management & Community Referrals:  Referral 06/19/2013  Referrals made for care management support none needed  Referrals made to community resources none

## 2015-05-23 NOTE — Assessment & Plan Note (Addendum)
Patient presented with a 1 week history of new vasculitic lesions on her umbilicus and left lower leg. Patient continues to abuse cocaine despite knowing the side effect of developing new skin lesions. Lesions do not appear to be infected and patient has no systemic signs of infection. I do not believe antibiotics or steroids are indicated at this time. She has been doing a good job of keeping the wounds dressed with bandages. I encouraged her to continue doing this and to return to the clinic if she develops fever, chills, or worsening of her skin lesions. I also encouraged her to stop using cocaine as this is the cause for her necrosing skin lesions.

## 2015-06-04 ENCOUNTER — Emergency Department (HOSPITAL_COMMUNITY): Payer: Medicaid Other

## 2015-06-04 ENCOUNTER — Observation Stay (HOSPITAL_COMMUNITY)
Admission: EM | Admit: 2015-06-04 | Discharge: 2015-06-05 | Disposition: A | Discharge status: Home / Self Care | Payer: Medicaid Other | Attending: Student in an Organized Health Care Education/Training Program | Admitting: Student in an Organized Health Care Education/Training Program

## 2015-06-04 ENCOUNTER — Observation Stay (HOSPITAL_COMMUNITY): Payer: Medicaid Other

## 2015-06-04 ENCOUNTER — Encounter (HOSPITAL_COMMUNITY): Payer: Self-pay | Admitting: Emergency Medicine

## 2015-06-04 DIAGNOSIS — F329 Major depressive disorder, single episode, unspecified: Secondary | ICD-10-CM | POA: Insufficient documentation

## 2015-06-04 DIAGNOSIS — L959 Vasculitis limited to the skin, unspecified: Secondary | ICD-10-CM | POA: Insufficient documentation

## 2015-06-04 DIAGNOSIS — L958 Other vasculitis limited to the skin: Secondary | ICD-10-CM

## 2015-06-04 DIAGNOSIS — D649 Anemia, unspecified: Secondary | ICD-10-CM | POA: Insufficient documentation

## 2015-06-04 DIAGNOSIS — Z7982 Long term (current) use of aspirin: Secondary | ICD-10-CM | POA: Diagnosis not present

## 2015-06-04 DIAGNOSIS — R262 Difficulty in walking, not elsewhere classified: Secondary | ICD-10-CM | POA: Diagnosis not present

## 2015-06-04 DIAGNOSIS — F17210 Nicotine dependence, cigarettes, uncomplicated: Secondary | ICD-10-CM | POA: Diagnosis not present

## 2015-06-04 DIAGNOSIS — L97903 Non-pressure chronic ulcer of unspecified part of unspecified lower leg with necrosis of muscle: Secondary | ICD-10-CM | POA: Diagnosis present

## 2015-06-04 DIAGNOSIS — I776 Arteritis, unspecified: Secondary | ICD-10-CM

## 2015-06-04 DIAGNOSIS — T405X4A Poisoning by cocaine, undetermined, initial encounter: Secondary | ICD-10-CM | POA: Diagnosis not present

## 2015-06-04 DIAGNOSIS — F14188 Cocaine abuse with other cocaine-induced disorder: Secondary | ICD-10-CM | POA: Diagnosis not present

## 2015-06-04 DIAGNOSIS — M310 Hypersensitivity angiitis: Secondary | ICD-10-CM | POA: Diagnosis not present

## 2015-06-04 DIAGNOSIS — L739 Follicular disorder, unspecified: Secondary | ICD-10-CM | POA: Insufficient documentation

## 2015-06-04 DIAGNOSIS — E785 Hyperlipidemia, unspecified: Secondary | ICD-10-CM | POA: Insufficient documentation

## 2015-06-04 DIAGNOSIS — I10 Essential (primary) hypertension: Secondary | ICD-10-CM | POA: Insufficient documentation

## 2015-06-04 DIAGNOSIS — Z79899 Other long term (current) drug therapy: Secondary | ICD-10-CM | POA: Insufficient documentation

## 2015-06-04 DIAGNOSIS — L97229 Non-pressure chronic ulcer of left calf with unspecified severity: Secondary | ICD-10-CM | POA: Diagnosis not present

## 2015-06-04 DIAGNOSIS — S81802A Unspecified open wound, left lower leg, initial encounter: Secondary | ICD-10-CM | POA: Insufficient documentation

## 2015-06-04 DIAGNOSIS — L8990 Pressure ulcer of unspecified site, unspecified stage: Secondary | ICD-10-CM

## 2015-06-04 DIAGNOSIS — F1410 Cocaine abuse, uncomplicated: Secondary | ICD-10-CM | POA: Insufficient documentation

## 2015-06-04 DIAGNOSIS — M79662 Pain in left lower leg: Secondary | ICD-10-CM | POA: Diagnosis present

## 2015-06-04 DIAGNOSIS — M069 Rheumatoid arthritis, unspecified: Secondary | ICD-10-CM | POA: Insufficient documentation

## 2015-06-04 DIAGNOSIS — Z791 Long term (current) use of non-steroidal anti-inflammatories (NSAID): Secondary | ICD-10-CM | POA: Insufficient documentation

## 2015-06-04 DIAGNOSIS — S81802D Unspecified open wound, left lower leg, subsequent encounter: Secondary | ICD-10-CM

## 2015-06-04 DIAGNOSIS — S91302A Unspecified open wound, left foot, initial encounter: Secondary | ICD-10-CM | POA: Diagnosis not present

## 2015-06-04 LAB — CBC WITH DIFFERENTIAL/PLATELET
Basophils Absolute: 0 10*3/uL (ref 0.0–0.1)
Basophils Relative: 0 % (ref 0–1)
EOS ABS: 0.1 10*3/uL (ref 0.0–0.7)
Eosinophils Relative: 1 % (ref 0–5)
HCT: 35 % — ABNORMAL LOW (ref 36.0–46.0)
Hemoglobin: 11.7 g/dL — ABNORMAL LOW (ref 12.0–15.0)
LYMPHS ABS: 1 10*3/uL (ref 0.7–4.0)
LYMPHS PCT: 24 % (ref 12–46)
MCH: 28.5 pg (ref 26.0–34.0)
MCHC: 33.4 g/dL (ref 30.0–36.0)
MCV: 85.2 fL (ref 78.0–100.0)
MONO ABS: 0.1 10*3/uL (ref 0.1–1.0)
MONOS PCT: 3 % (ref 3–12)
Neutro Abs: 3 10*3/uL (ref 1.7–7.7)
Neutrophils Relative %: 72 % (ref 43–77)
PLATELETS: 324 10*3/uL (ref 150–400)
RBC: 4.11 MIL/uL (ref 3.87–5.11)
RDW: 14.2 % (ref 11.5–15.5)
WBC: 4.1 10*3/uL (ref 4.0–10.5)

## 2015-06-04 LAB — SEDIMENTATION RATE: SED RATE: 64 mm/h — AB (ref 0–22)

## 2015-06-04 LAB — BASIC METABOLIC PANEL
Anion gap: 9 (ref 5–15)
BUN: 18 mg/dL (ref 6–20)
CHLORIDE: 102 mmol/L (ref 101–111)
CO2: 26 mmol/L (ref 22–32)
CREATININE: 0.63 mg/dL (ref 0.44–1.00)
Calcium: 9.3 mg/dL (ref 8.9–10.3)
GFR calc Af Amer: >60 mL/min (ref >60)
GFR calc non Af Amer: >60 mL/min (ref >60)
GLUCOSE: 90 mg/dL (ref 65–99)
Potassium: 3.8 mmol/L (ref 3.5–5.1)
Sodium: 137 mmol/L (ref 135–145)

## 2015-06-04 LAB — RAPID URINE DRUG SCREEN, HOSP PERFORMED
Amphetamines: NOT DETECTED
Barbiturates: NOT DETECTED
Benzodiazepines: NOT DETECTED
Cocaine: POSITIVE — AB
OPIATES: POSITIVE — AB
Tetrahydrocannabinol: NOT DETECTED

## 2015-06-04 MED ORDER — BUPROPION HCL ER (XL) 150 MG PO TB24
150.0000 mg | ORAL_TABLET | Freq: Every day | ORAL | Status: DC
  Administered 2015-06-04 – 2015-06-05 (×2): 150 mg via ORAL
  Filled 2015-06-04 (×3): qty 1

## 2015-06-04 MED ORDER — LISINOPRIL 10 MG PO TABS
10.0000 mg | ORAL_TABLET | Freq: Every day | ORAL | Status: DC
  Administered 2015-06-04 – 2015-06-05 (×2): 10 mg via ORAL
  Filled 2015-06-04 (×2): qty 1

## 2015-06-04 MED ORDER — SIMVASTATIN 20 MG PO TABS
20.0000 mg | ORAL_TABLET | Freq: Every day | ORAL | Status: DC
  Administered 2015-06-04: 20 mg via ORAL
  Filled 2015-06-04: qty 2

## 2015-06-04 MED ORDER — ASPIRIN EC 81 MG PO TBEC
81.0000 mg | DELAYED_RELEASE_TABLET | Freq: Every day | ORAL | Status: DC
  Administered 2015-06-04 – 2015-06-05 (×2): 81 mg via ORAL
  Filled 2015-06-04 (×2): qty 1

## 2015-06-04 MED ORDER — ENOXAPARIN SODIUM 40 MG/0.4ML ~~LOC~~ SOLN
40.0000 mg | SUBCUTANEOUS | Status: DC
  Administered 2015-06-04: 40 mg via SUBCUTANEOUS
  Filled 2015-06-04: qty 0.4

## 2015-06-04 MED ORDER — DOXYCYCLINE HYCLATE 100 MG PO TABS
100.0000 mg | ORAL_TABLET | Freq: Two times a day (BID) | ORAL | Status: DC
  Administered 2015-06-04: 100 mg via ORAL
  Filled 2015-06-04: qty 1

## 2015-06-04 MED ORDER — HYDROMORPHONE HCL 1 MG/ML IJ SOLN
1.0000 mg | Freq: Once | INTRAMUSCULAR | Status: AC
  Administered 2015-06-04: 1 mg via INTRAVENOUS
  Filled 2015-06-04: qty 1

## 2015-06-04 MED ORDER — HYDROMORPHONE HCL 1 MG/ML IJ SOLN
0.5000 mg | Freq: Once | INTRAMUSCULAR | Status: AC
  Administered 2015-06-04: 0.5 mg via INTRAVENOUS
  Filled 2015-06-04: qty 1

## 2015-06-04 MED ORDER — OXYCODONE HCL 5 MG PO TABS
5.0000 mg | ORAL_TABLET | ORAL | Status: DC | PRN
  Administered 2015-06-04 – 2015-06-05 (×5): 5 mg via ORAL
  Filled 2015-06-04 (×5): qty 1

## 2015-06-04 NOTE — ED Notes (Signed)
[facility] called.

## 2015-06-04 NOTE — ED Provider Notes (Signed)
CSN: [id number]     Arrival date & time 06/04/15  1225 History   First MD Initiated Contact with Patient 06/04/15 1242     Chief Complaint  Patient presents with  . Leg Pain  . leg infection   . sent from [facility] [place]     (Consider location/radiation/quality/duration/timing/severity/associated sxs/prior Treatment) HPI Comments: 54 year old female with high blood pressure, tobacco abuse, cocaine abuse, cocaine induced vasculitis, depression, substance abuse, last used cocaine 3-4 days ago presents with worsening pain and ulceration left lower extremity. Patient was sent over from wound care for further evaluation and pain control. Tender to palpation overall constant. Patient has been going regularly for wound care. Patient feels mild worsening of pain minimal discharge. No streaking erythema. No fevers or chills. Patient unsure if history of bone infection.  Patient is a 54 y.o. female presenting with leg pain. The history is provided by the patient.  Leg Pain Associated symptoms: no back pain, no fever and no neck pain     Past Medical History  Diagnosis Date  . 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 Followed by Dr. Louanne Skye  . Hypertension   . Cocaine abuse     ongoing with resultant vaculitis.  . Depression   . Normocytic anemia     BL Hgb 9.8-12. Last anemia panel 04/2010 - showing Fe 19, ferritin 101.  Pt on monthly B12 injections  . 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 CAP (community acquired pneumonia) 03/2014 X 2  . Daily headache   . Migraines     "probably 5-6/yr" (04/21/2014)  . Rheumatoid arthritis(714.0)     patient reported  . Inflammatory arthritis    Past Surgical History  Procedure Laterality Date  . Skin biopsy Bilateral 04/2010    shin nodules  . Irrigation and debridement abscess Bilateral 09/26/2013    Procedure: DEBRIDEMENT ULCERS BILATERAL THIGHS;  Surgeon: Gwenyth Ober, MD;  Location: [facility];  Service: General;   Laterality: Bilateral;  . Hernia repair      "stomach"   Family History  Problem Relation Age of Onset  . Breast cancer Mother     Breast cancer  . Alcohol abuse Mother   . Colon cancer Maternal Aunt 53  . Alcohol abuse Father    Social History  Substance Use Topics  . Smoking status: Current Every Day Smoker -- 0.30 packs/day for 34 years    Types: Cigarettes  . Smokeless tobacco: Never Used     Comment: "Trying very hard".  . Alcohol Use: No   OB History    No data available     Review of Systems  Constitutional: Negative for fever and chills.  HENT: Negative for congestion.   Eyes: Negative for visual disturbance.  Respiratory: Negative for shortness of breath.   Cardiovascular: Negative for chest pain.  Gastrointestinal: Negative for vomiting and abdominal pain.  Genitourinary: Negative for dysuria and flank pain.  Musculoskeletal: Negative for back pain, joint swelling, neck pain and neck stiffness.  Skin: Positive for rash and wound.  Neurological: Negative for light-headedness and headaches.      Allergies  Acetaminophen  Home Medications   Prior to Admission medications   Medication Sig Start Date End Date Taking? Authorizing Provider  buPROPion (WELLBUTRIN XL) 150 MG 24 hr tablet Take 150 mg by mouth daily.   Yes Historical Provider, MD  lisinopril (PRINIVIL,ZESTRIL) 10 MG tablet Take 1 tablet (10 mg total) by  mouth daily. Patient taking differently: Take 10 mg by mouth daily as needed (for high blood pressure).  03/11/15  Yes Kelby Aline, MD  aspirin EC 81 MG EC tablet Take 1 tablet (81 mg total) by mouth daily. Patient not taking: Reported on 04/21/2015 04/08/15   Milagros Loll, MD  lidocaine (XYLOCAINE) 4 % external solution Apply topically 3 (three) times daily as needed for moderate pain. Patient not taking: Reported on 05/23/2015 04/22/15   Milagros Loll, MD  naproxen (NAPROSYN) 500 MG tablet Take 1 tablet (500 mg total) by mouth 2 (two) times  daily with a meal. Patient not taking: Reported on 05/23/2015 04/22/15   Milagros Loll, MD  simvastatin (ZOCOR) 20 MG tablet Take 1 tablet (20 mg total) by mouth daily at 6 PM. Patient not taking: Reported on 05/23/2015 04/22/15   Milagros Loll, MD   BP 143/91 mmHg  Pulse 97  Temp(Src) 98.2 F (36.8 C) (Oral)  Resp 20  SpO2 100% Physical Exam  Constitutional: She is oriented to person, place, and time. She appears well-developed and well-nourished.  HENT:  Head: Normocephalic and atraumatic.  Eyes: Conjunctivae are normal. Right eye exhibits no discharge. Left eye exhibits no discharge.  Neck: Normal range of motion. Neck supple. No tracheal deviation present.  Cardiovascular: Normal rate and regular rhythm.   Pulmonary/Chest: Effort normal and breath sounds normal.  Abdominal: Soft. She exhibits no distension. There is no tenderness. There is no guarding.  Musculoskeletal: She exhibits tenderness. She exhibits no edema.  Neurological: She is alert and oriented to person, place, and time.  Skin: Skin is warm. Rash noted.  Patient has tenderness left lateral distal leg/distal fibula. Multiple superficial ulcerations without streaking erythema mild drainage from posterior aspect your lungs. Neurovascular intact distal left leg. Bandage is removed.  Psychiatric: She has a normal mood and affect.  Nursing note and vitals reviewed.   ED Course  Procedures (including critical care time) Labs Review Labs Reviewed  CBC WITH DIFFERENTIAL/PLATELET - Abnormal; Notable for the following:    Hemoglobin 11.7 (*)    HCT 35.0 (*)    All other components within normal limits  SEDIMENTATION RATE - Abnormal; Notable for the following:    Sed Rate 64 (*)    All other components within normal limits  BASIC METABOLIC PANEL    Imaging Review Dg Tibia/fibula Left  06/04/2015   CLINICAL DATA:  Left lower extremity wound.  EXAM: LEFT TIBIA AND FIBULA - 2 VIEW  COMPARISON:  03/09/2015  FINDINGS:  High-density material around skin thickening and ulceration on the lower leg could reflect calcification or superficial debris/ointment. No evidence of osseous infection or soft tissue gas. No acute fracture or malalignment.  IMPRESSION: Lower leg ulcers with debris/ointment or calcification. No soft tissue gas or osseous abnormality.   Electronically Signed   By: Monte Fantasia M.D.   On: 06/04/2015 14:22   I have personally reviewed and evaluated these images and lab results as part of my medical decision-making.   EKG Interpretation None      MDM   Final diagnoses:  Pressure ulcer  Leg wound, left, subsequent encounter  Cocaine abuse   Patient with drug abuse and skin lesion history presents from [facility] for worsening left leg wound. Likely combination of unhealing wound/drug abuse. Plan for pain meds, blood work, x-ray and sedimentation rate.   Sedimentation rate mildly elevated. Pain worsening. Discussed with internal medicine resident to assist with final disposition likely plan for  observation for further workup/possible MRI for more details of nonhealing wound. Xrays were independently reviewed by myself.    Medications  HYDROmorphone (DILAUDID) injection 1 mg (1 mg Intravenous Given 06/04/15 1333)    Filed Vitals:   06/04/15 1229  BP: 143/91  Pulse: 97  Temp: 98.2 F (36.8 C)  TempSrc: Oral  Resp: 20  SpO2: 100%    Final diagnoses:  Pressure ulcer  Leg wound, left, subsequent encounter  Cocaine abuse       Elnora Morrison, MD 06/04/15 [phone]

## 2015-06-04 NOTE — ED Notes (Signed)
RN Velna Hatchet at bedside for lab draws

## 2015-06-04 NOTE — ED Notes (Signed)
Patient transported to X-ray 

## 2015-06-04 NOTE — ED Notes (Signed)
Pt request to leave wound undressed for comfort.

## 2015-06-04 NOTE — Progress Notes (Signed)
Report received from [employer].

## 2015-06-04 NOTE — Progress Notes (Signed)
Patient admitted to 5W19 from [facility], VSS, afebrile, A+ox4. Patient is sitting comfortably in bed, call bell within reach, bed alarm on, and was oriented to the unit. Will give night shift report and continue to monitor.

## 2015-06-04 NOTE — Progress Notes (Signed)
Upon putting pt on bed pan her crack pipe fell out of her pocket. Security and charge RN was notified. Security came up to search through pt's belongings. Crack pipe and bristle foil were taken by security. Pt was cooperative and understood need to perform search.

## 2015-06-04 NOTE — H&P (Signed)
Date: 06/04/2015               Patient Name:  [name] MRN: [id number]  DOB: January 10, 1961 Age / Sex: 54 y.o., female   PCP: Zada Finders, MD         Medical Service: Internal Medicine Teaching Service         Attending Physician: Dr. Axel Filler, MD    First Contact: Dr. Zada Finders Pager: [phone]  Second Contact: Dr. Joni Reining Pager: [phone]       After Hours (After 5p/  First Contact Pager: [phone]  weekends / holidays): Second Contact Pager: [phone]   Chief Complaint: Left lower leg wound  History of Present Illness: [name] is a 54 y.o. 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  She was originally seen at [facility] and transferred to our care.  She reports that the severity of pain has increased recently and that she has difficulty ambulating due to throbbing pain in her leg.  Also, reports tenderness to palpation of the surrounding area.  She is followed by wound care at [facility] and reports that she does a good job of changing her dressings and she does not pick at her wound.  She reports all her other chronic wounds in the setting of levamisole vasculitis are well healed including her ears and right foot.  She also reports that her left axilla folliculitis has flared up again in the past couple days and is causing her some discomfort.  She denies any drainage that she is aware of.   Patient reports that she is not having any chest pain, shortness of breath, n/v/d.  Denies any urinary complaints.  She does report occasional subjective fever.  Otherwise no complaints other than those noted above.  Meds: Current Facility-Administered Medications  Medication Dose Route Frequency Provider Last Rate Last Dose  . aspirin EC tablet 81 mg  81 mg Oral Daily Corky Sox, MD      . buPROPion  (WELLBUTRIN XL) 24 hr tablet 150 mg  150 mg Oral Daily Corky Sox, MD      . doxycycline (VIBRA-TABS) tablet 100 mg  100 mg Oral Q12H Corky Sox, MD   100 mg at 06/04/15 2111  . enoxaparin (LOVENOX) injection 40 mg  40 mg Subcutaneous Q24H Corky Sox, MD      . lisinopril (PRINIVIL,ZESTRIL) tablet 10 mg  10 mg Oral Daily Corky Sox, MD      . oxyCODONE (Oxy IR/ROXICODONE) immediate release tablet 5 mg  5 mg Oral Q4H PRN Corky Sox, MD   5 mg at 06/04/15 2111  . simvastatin (ZOCOR) tablet 20 mg  20 mg Oral q1800 Corky Sox, MD        Allergies: Allergies as of 06/04/2015 - Review Complete 06/04/2015  Allergen Reaction Noted  . Acetaminophen Swelling    Past Medical History  Diagnosis Date  . 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 Followed by Dr. Louanne Skye  . Hypertension   . Cocaine abuse     ongoing with resultant vaculitis.  . Depression   . Normocytic anemia     BL Hgb 9.8-12. Last anemia panel 04/2010 - showing Fe 19, ferritin 101.  Pt on monthly B12 injections  . VASCULITIS 04/17/2010    2/2 levimasole  toxicity vs autoimmune d/o     . CAP (community acquired pneumonia) 03/2014 X 2  . Daily headache   . Migraines     "probably 5-6/yr" (04/21/2014)  . Rheumatoid arthritis(714.0)     patient reported  . Inflammatory arthritis    Past Surgical History  Procedure Laterality Date  . Skin biopsy Bilateral 04/2010    shin nodules  . Irrigation and debridement abscess Bilateral 09/26/2013    Procedure: DEBRIDEMENT ULCERS BILATERAL THIGHS;  Surgeon: Gwenyth Ober, MD;  Location: [place];  Service: General;  Laterality: Bilateral;  . Hernia repair      "stomach"   Family History  Problem Relation Age of Onset  . Breast cancer Mother     Breast cancer  . Alcohol abuse Mother   . Colon cancer Maternal Aunt 69  . Alcohol abuse Father    Social History   Social History  . Marital Status: Single    Spouse Name: N/A  . Number of Children: 0  . Years of Education: 11th  grade   Occupational History  . Disability     since 2011, due to her rheumatoid arthritis   Social History Main Topics  . Smoking status: Current Every Day Smoker -- 0.30 packs/day for 34 years    Types: Cigarettes  . Smokeless tobacco: Never Used     Comment: "Trying very hard".  . Alcohol Use: No  . Drug Use: Yes    Special: "Crack" cocaine, Cocaine     Comment: States she still uses crack "a little bit".  . Sexual Activity: No   Other Topics Concern  . Not on file   Social History Narrative   Unemployed:  cleaning in past   Living at [place]; has Medicaid.   crack/cocaine use; pt denies IVDU   tobacco:  1/2 ppd, trying to quit   alcohol:  none        Review of Systems: Pertinent items are noted in HPI.  Physical Exam: Blood pressure 152/76, pulse 96, temperature 98.8 F (37.1 C), temperature source Oral, resp. rate 20, height _0  (1.549 m), weight 95 lb (43.092 kg), SpO2 100 %.   General: Patient is a well-developed and well-nourished, in no acute distress and cooperative with exam.  Head: Normocephalic and atraumatic. Eyes: EOMI, No scleral icterus.  Neck: Supple, trachea midline, normal ROM, No JVD Cardiovascular: RRR, S1 normal, S2 normal Pulmonary/Chest: Air entry equal bilaterally, no wheezes, rales, or rhonchi. Abdominal: Soft, non-tender, non-distended Extremities: Tenderness to palpation of left lower extremity with non-healing ulcerated wound.  Minimal drainage present, no warmth noted to the surrounding area, distal pulses intact.  Multiple other healed vasculitic lesions across all extremities.  Also with some mild tenderness to palpation in her left axilla with mild erythema Neurological: alert and oriented, CN II-XII grossly intact Skin: Warm Psychiatric: Tearful during exam.  States she is trying to stay off drugs with last reported usage of crack-cocaine being 4 days ago  Lab results: Basic Metabolic Panel:  Recent Labs  06/04/15 1321    NA 137  K 3.8  CL 102  CO2 26  GLUCOSE 90  BUN 18  CREATININE 0.63  CALCIUM 9.3   CBC:  Recent Labs  06/04/15 1321  WBC 4.1  NEUTROABS 3.0  HGB 11.7*  HCT 35.0*  MCV 85.2  PLT 324    Drugs of Abuse     Component Value Date/Time   LABOPIA POSITIVE* 06/04/2015 2104   LABOPIA PPS  08/31/2012 [place] 06/04/2015 2104   COCAINSCRNUR NEG 08/31/2012 1045   LABBENZ NONE DETECTED 06/04/2015 2104   LABBENZ NEG 08/31/2012 1045   LABBENZ NEGATIVE 06/12/2011 0221   AMPHETMU NONE DETECTED 06/04/2015 2104   AMPHETMU NEG 08/31/2012 1045   AMPHETMU NEGATIVE 06/12/2011 0221   THCU NONE DETECTED 06/04/2015 2104   THCU NEG 08/31/2012 1045   THCU 63.4* 05/20/2012 1405   LABBARB NONE DETECTED 06/04/2015 2104   LABBARB NEG 08/31/2012 1045     Imaging results:  Dg Tibia/fibula Left  06/04/2015   CLINICAL DATA:  Left lower extremity wound.  EXAM: LEFT TIBIA AND FIBULA - 2 VIEW  COMPARISON:  03/09/2015  FINDINGS: High-density material around skin thickening and ulceration on the lower leg could reflect calcification or superficial debris/ointment. No evidence of osseous infection or soft tissue gas. No acute fracture or malalignment.  IMPRESSION: Lower leg ulcers with debris/ointment or calcification. No soft tissue gas or osseous abnormality.   Electronically Signed   By: Monte Fantasia M.D.   On: 06/04/2015 14:22    Assessment & Plan by Problem: Active Problems:   Vasculitis   Skin ulcer  54 y.o. 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  Non-healing ulcer of left lower extremity -likely secondary to levamisole vasculitis consistent with other skin findings of multiple vasculitic lesions across her extremities and ears.  ESR elevated to 64.  CBC with WBC of 4.1 with normal differential, afebrile, no  systemic signs of infection.  She does have considerable tenderness to palpation around the area of her wound.  X-ray of her leg shows no evidence of osseous infection or soft tissue gas.  Patient follows with wound care center and reports taking good care of changing her wound dressings.  Reports last usage of crack-cocaine being 4 days ago -MRI to rule out osteomyelitis -oxycodone 69m q4h prn for pain -UDS positive for cocaine -repeat CBC, BMP in the morning -wound care consult  Folliculitis -presence of mild tenderness and erythema of left axilla.  Likely related to staph or strep.  Previously treated for this in July 2016 with resolution of symptoms on doxycycline -doxycycline 1015mPO q12h  Hypertension -continue lisinopril 1047mO daily  Hyperlipidemia -continue simvastatin 68m14m daily  Depression -continue bupropion 150mg51mdaily  Substance abuse -states last used crack-cocaine 4 days ago.  States she is trying to stay clean -UDS positive for cocaine and opiates -counseled on need to try her best to abstain from usage. -consider social work consult  Diet: Regular  DVT PPx: Lovenox  Code: Full   Dispo: Disposition is deferred at this time, awaiting improvement of current medical problems. Anticipated discharge in approximately 1-2 day(s).   The patient does have a current PCP (VishaZada Finders and [facility].  The patient does not have transportation limitations that hinder transportation to clinic appointments.  Signed: AndreJule Ser8/30/2016, 9:54 PM

## 2015-06-04 NOTE — ED Notes (Addendum)
Pt was sent here from [place] with paper work that states: "Cocaine Abuse.  Multiple vesicles left lower extremity.  C/o 10+ pain, continuous and will not permit adequate exam. She is a patient of [facility].  I am not willing to given opioids to the patient who needs urgent pain management and possibly dermatology consultation. Thanks" then unreadable name.   Pt states that she has infection in left leg and was sent here for that. Pt does admit to using cocaine, but states "im trying to quit that".

## 2015-06-04 NOTE — Progress Notes (Signed)
Paged and spoke to IM MD Jones via phone for new admission orders.

## 2015-06-04 NOTE — ED Notes (Signed)
Pt ambulated x1 assist to bathroom

## 2015-06-04 NOTE — ED Notes (Signed)
[facility] at bedside for transport. Pt belongings sent with patient. No family at bedside.

## 2015-06-04 NOTE — ED Notes (Signed)
Wound being assessed by Dr. Reather Converse. POC discussed. Pt verbalized understanding.

## 2015-06-05 ENCOUNTER — Encounter (HOSPITAL_COMMUNITY): Payer: Self-pay | Admitting: *Deleted

## 2015-06-05 DIAGNOSIS — S81802A Unspecified open wound, left lower leg, initial encounter: Secondary | ICD-10-CM | POA: Insufficient documentation

## 2015-06-05 DIAGNOSIS — F14188 Cocaine abuse with other cocaine-induced disorder: Secondary | ICD-10-CM | POA: Diagnosis not present

## 2015-06-05 DIAGNOSIS — L958 Other vasculitis limited to the skin: Secondary | ICD-10-CM | POA: Diagnosis not present

## 2015-06-05 LAB — BASIC METABOLIC PANEL
Anion gap: 9 (ref 5–15)
BUN: 11 mg/dL (ref 6–20)
CALCIUM: 8.5 mg/dL — AB (ref 8.9–10.3)
CHLORIDE: 99 mmol/L — AB (ref 101–111)
CO2: 26 mmol/L (ref 22–32)
CREATININE: 0.66 mg/dL (ref 0.44–1.00)
GFR calc non Af Amer: >60 mL/min (ref >60)
Glucose, Bld: 87 mg/dL (ref 65–99)
Potassium: 3.2 mmol/L — ABNORMAL LOW (ref 3.5–5.1)
SODIUM: 134 mmol/L — AB (ref 135–145)

## 2015-06-05 LAB — CBC
HCT: 30.6 % — ABNORMAL LOW (ref 36.0–46.0)
Hemoglobin: 10.1 g/dL — ABNORMAL LOW (ref 12.0–15.0)
MCH: 27.7 pg (ref 26.0–34.0)
MCHC: 33 g/dL (ref 30.0–36.0)
MCV: 83.8 fL (ref 78.0–100.0)
PLATELETS: 253 10*3/uL (ref 150–400)
RBC: 3.65 MIL/uL — ABNORMAL LOW (ref 3.87–5.11)
RDW: 13.9 % (ref 11.5–15.5)
WBC: 3 10*3/uL — AB (ref 4.0–10.5)

## 2015-06-05 MED ORDER — LISINOPRIL 10 MG PO TABS: 10.0000 mg | ORAL_TABLET | Freq: Every day | ORAL | Status: DC

## 2015-06-05 MED ORDER — OXYCODONE HCL 5 MG PO TABS
10.0000 mg | ORAL_TABLET | Freq: Once | ORAL | Status: AC
  Administered 2015-06-05: 10 mg via ORAL
  Filled 2015-06-05: qty 2

## 2015-06-05 MED ORDER — ASPIRIN 81 MG PO TBEC: 81.0000 mg | DELAYED_RELEASE_TABLET | Freq: Every day | ORAL | Status: DC

## 2015-06-05 MED ORDER — MUPIROCIN 2 % EX OINT
TOPICAL_OINTMENT | Freq: Three times a day (TID) | CUTANEOUS | Status: DC
  Administered 2015-06-05: 11:00:00 via TOPICAL
  Filled 2015-06-05: qty 22

## 2015-06-05 MED ORDER — BUPROPION HCL ER (XL) 150 MG PO TB24: 150.0000 mg | ORAL_TABLET | Freq: Every day | ORAL | Status: DC

## 2015-06-05 MED ORDER — ENOXAPARIN SODIUM 30 MG/0.3ML ~~LOC~~ SOLN: 30.0000 mg | SUBCUTANEOUS | Status: DC

## 2015-06-05 MED ORDER — MUPIROCIN 2 % EX OINT: TOPICAL_OINTMENT | Freq: Three times a day (TID) | CUTANEOUS | Status: DC

## 2015-06-05 MED ORDER — SIMVASTATIN 20 MG PO TABS: 20.0000 mg | ORAL_TABLET | Freq: Every day | ORAL | Status: DC

## 2015-06-05 NOTE — Progress Notes (Addendum)
Patient unable to bear weight on left leg related to pain. Patient was sleeping when RN entered room and patient was eager to try ambulating. Patient states "it hurts the most when I stand on my leg". MD contacted to see if patient needs a PT eval for equipment need.   1:14 PM MD paged again.

## 2015-06-05 NOTE — Progress Notes (Signed)
Spoke with [name]. Requested for walker and 3in1. Order placed, referral made to [facility] for delivery to home. Patient's address on face sheet verified to be correct.

## 2015-06-05 NOTE — Progress Notes (Addendum)
Physical Therapist paged to make aware discharge orders are in for patient currently and need for evaluation as soon as possible.   3:17 PM MD made to notify of Physical Therapy recommendations and to clarify orders.Patient can not tolerate ambulating more than four feet r/t pain and also need equipment orders placed. Awaiting response. 3:29 PM MD paged again. 1600: MD responded and ordered plan to give one time order of pain medication and evaulaute if patient can ambulate easier in 2 hours.

## 2015-06-05 NOTE — Evaluation (Signed)
Physical Therapy Evaluation Patient Details Name: [name] MRN: [id number] DOB: 1961-02-27 Today's Date: 06/05/2015   History of Present Illness  54 year old woman with h/o HTN, depression, levimasole containing cocaine and drug abuse leading to levimasole vasculitis who comes in with left lower leg pain that shows a chronic non healing wound, here to rule out osteomyelitis  Clinical Impression  Pt admitted with above diagnosis. Pt currently with functional limitations due to the deficits listed below (see PT Problem List). Pt tolerated only 4' of ambulation with RW and unable to put any wt through LLE due to pain.  Pt will benefit from skilled PT to increase their independence and safety with mobility to allow discharge to the venue listed below.       Follow Up Recommendations No PT follow up    Equipment Recommendations  Rolling walker with 5" wheels;3in1 (PT)    Recommendations for Other Services       Precautions / Restrictions Precautions Precautions: None Restrictions Weight Bearing Restrictions: No      Mobility  Bed Mobility Overal bed mobility: Independent                Transfers Overall transfer level: Independent Equipment used: Rolling walker (2 wheeled)             General transfer comment: taught pt safe use of RW   Ambulation/Gait Ambulation/Gait assistance: Supervision Ambulation Distance (Feet): 4 Feet Assistive device: Rolling walker (2 wheeled) Gait Pattern/deviations: Step-to pattern Gait velocity: decreased   General Gait Details: pt hopped on right foot, no wt on left, taught how to do this safely with RW and educated her that as soon as she can tolerate wt on LLE that she needs to put that foot down to avoid ankle and knee flexion contractures. Pt tolerated only 4' fwd before pain unbearable and pt crying, hopped back 4' to bed to lie down  Stairs            Wheelchair Mobility    Modified Rankin (Stroke Patients  Only)       Balance Overall balance assessment: No apparent balance deficits (not formally assessed)                                           Pertinent Vitals/Pain Pain Assessment: Faces Faces Pain Scale: Hurts worst Pain Location: pt crying and writhing in pain, beating head against mattress because of left distal LE pain after ambulation Pain Intervention(s): Monitored during session;Premedicated before session;Repositioned    Home Living Family/patient expects to be discharged to:: Private residence Living Arrangements: Other relatives Available Help at Discharge: Family;Available PRN/intermittently Type of Home: Apartment Home Access: Stairs to enter Entrance Stairs-Rails: None Entrance Stairs-Number of Steps: 1 Home Layout: One level Home Equipment: None      Prior Function Level of Independence: Independent               Hand Dominance        Extremity/Trunk Assessment   Upper Extremity Assessment: Overall WFL for tasks assessed           Lower Extremity Assessment: LLE deficits/detail   LLE Deficits / Details: pt with  minimal mvmt of LLE due to pain  Cervical / Trunk Assessment: Normal  Communication   Communication: No difficulties  Cognition Arousal/Alertness: Awake/alert Behavior During Therapy: WFL for tasks assessed/performed;Anxious Overall Cognitive Status:  Within Functional Limits for tasks assessed                      General Comments General comments (skin integrity, edema, etc.): pt needs RW to be able to mobilize at all but even with it, is not tolerating enough ambulation to be able to get into her home from transportation. Would also benefit from [facility] to transfer to it keeping LLE up on bed since dependent position so painful    Exercises General Exercises - Lower Extremity Ankle Circles/Pumps: AROM;Both;15 reps;Supine      Assessment/Plan    PT Assessment Patient needs continued PT services  PT  Diagnosis Difficulty walking;Abnormality of gait;Acute pain   PT Problem List Decreased activity tolerance;Decreased mobility;Pain;Decreased knowledge of use of DME  PT Treatment Interventions DME instruction;Gait training;Stair training;Functional mobility training;Therapeutic activities;Therapeutic exercise;Balance training;Patient/family education   PT Goals (Current goals can be found in the Care Plan section) Acute Rehab PT Goals Patient Stated Goal: less pain, be able to sleep PT Goal Formulation: With patient Time For Goal Achievement: 06/19/15 Potential to Achieve Goals: Fair    Frequency Min 3X/week   Barriers to discharge        Co-evaluation               End of Session Equipment Utilized During Treatment: Gait belt Activity Tolerance: Patient limited by pain Patient left: in bed;with call bell/phone within reach Nurse Communication: Mobility status;Other (comment) (pain)    Functional Assessment Tool Used: clinical judgement Functional Limitation: Mobility: Walking and moving around Mobility: Walking and Moving Around Current Status [phone]): At least 1 percent but less than 20 percent impaired, limited or restricted Mobility: Walking and Moving Around Goal Status [phone]): 0 percent impaired, limited or restricted    Time: 1452-1505 PT Time Calculation (min) (ACUTE ONLY): 13 min   Charges:   PT Evaluation $Initial PT Evaluation Tier I: 1 Procedure     PT G Codes:   PT G-Codes **NOT FOR INPATIENT CLASS** Functional Assessment Tool Used: clinical judgement Functional Limitation: Mobility: Walking and moving around Mobility: Walking and Moving Around Current Status ([id number]): At least 1 percent but less than 20 percent impaired, limited or restricted Mobility: Walking and Moving Around Goal Status [phone]): 0 percent impaired, limited or restricted  Leighton Roach, [place], [place] 06/05/2015, 3:29 PM

## 2015-06-05 NOTE — Discharge Summary (Signed)
Name: [name] MRN: [id number] DOB: 11-23-1960 54 y.o. PCP: Cristina Finders, MD  Date of Admission: 06/04/2015 12:27 PM Date of Discharge: 06/05/2015 Attending Physician: Cristina Filler, MD  Discharge Diagnosis: 1. 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     buPROPion 150 MG 24 hr tablet  Commonly known as:  WELLBUTRIN XL  Take 150 mg by mouth daily.     lidocaine 4 % external solution  Commonly known as:  XYLOCAINE  Apply topically 3 (three) times daily as needed for moderate pain.     lisinopril 10 MG tablet  Commonly known as:  PRINIVIL,ZESTRIL  Take 1 tablet (10 mg total) by mouth daily.     mupirocin ointment 2 %  Commonly known as:  BACTROBAN  Apply topically 3 (three) times daily.     simvastatin 20 MG tablet  Commonly known as:  ZOCOR  Take 1 tablet (20 mg total) by mouth daily at 6 PM.        Disposition and follow-up:   [name] was discharged from [facility] in Stable condition.  At the hospital follow up visit please address:  1.    Has the patient Followed up with wound care? Needs follow up with rheumatology- please read the hospital course for full info on why [facility] needs to stop using cocaine- please encourage that. She has a lot of social issues- maybe our clinic social worker can help?   2.  Labs / imaging needed at time of follow-up:   3.  Pending labs/ test needing follow-up:   Follow-up Appointments:     Follow-up Information    Follow up with [name]             . Schedule an appointment as soon as possible for a visit in 2 days.   Why:  For  wound re-check   Contact information:   [address] [phone]      Follow up with Cristina Finders, MD. Go on 06/12/2015.   Specialty:  Internal Medicine   Why:  1:45 hospital follow up appointment    Contact information:   [place] [phone]       Discharge Instructions: Discharge Instructions    Diet - low sodium heart healthy    Complete by:  As directed      Discharge instructions    Complete by:  As directed   Please follow up with the wound center in [place] [facility]  Please stop using cocaine- this is causing a lot of your symptoms- I hope you understand the seriousness of the situation     Increase activity slowly    Complete by:  As directed            Consultations:    Procedures Performed:  Dg Tibia/fibula Left  06/04/2015   CLINICAL DATA:  Left lower extremity wound.  EXAM: LEFT TIBIA AND FIBULA - 2 VIEW  COMPARISON:  03/09/2015  FINDINGS: High-density material around skin thickening and ulceration on the lower leg could reflect calcification or superficial debris/ointment. No evidence of osseous infection or soft tissue gas. No acute fracture or malalignment.  IMPRESSION: Lower leg ulcers with debris/ointment or calcification. No soft tissue gas or osseous abnormality.   Electronically Signed   By: Monte Fantasia M.D.   On: 06/04/2015 14:22   Mr Tibia Fibula Left Wo Contrast  06/05/2015   CLINICAL DATA:  Increasing left lower leg pain, difficulty walking and nonhealing wound. History of drug abuse and vasculitis.  EXAM: MRI OF LOWER LEFT EXTREMITY WITHOUT CONTRAST  TECHNIQUE: Multiplanar, multisequence MR imaging of the left lower leg was performed. No intravenous contrast was administered.  COMPARISON:  Radiographs 06/04/2015. MRI of the lower legs 04/21/2010 and of the left foot 03/09/2015.  FINDINGS: Examination was performed using the body coil. Both lower legs are included on the axial  and coronal images.  Capsules were placed around the nonhealing wound posteromedially in the distal left lower leg. There is overlying skin irregularity with dermal thickening and subcutaneous edema. The subcutaneous edema extends laterally. No focal fluid collection demonstrated on noncontrast imaging. No evidence of foreign body, tenosynovitis or muscular T2 hyperintensity.  The left tibia and fibula appear normal.  No significant abnormalities identified within the right lower leg.  IMPRESSION: 1. Nonhealing wound in the posteromedial aspect of the distal left lower leg is associated with surrounding subcutaneous edema most consistent with cellulitis. 2. No drainable abscess, deep soft tissue infection or evidence of osteomyelitis.   Electronically Signed   By: Richardean Sale M.D.   On: 06/05/2015 08:04    2D Echo:   Cardiac Cath:   Admission HPI: [name] is a 54 y.o. 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 She was originally seen at [facility] and transferred to our care. She reports that the severity of pain has increased recently and that she has difficulty ambulating due to throbbing pain in her leg. Also, reports tenderness to palpation of the surrounding area. She is followed by wound care at [facility] and reports that she does a good job of changing her dressings and she does not pick at her wound. She reports all her other chronic wounds in the setting of levamisole vasculitis are well healed including her ears and right foot.  She also reports that her left axilla folliculitis has flared up again in the past couple days and is causing her some discomfort. She denies any drainage that she is aware of.   Patient reports that she is not having any chest pain, shortness of breath, n/v/d. Denies any urinary  complaints. She does report occasional subjective fever. Otherwise no complaints other than those noted above    Hospital Course by problem list: Active Problems:   Vasculitis   Skin ulcer   Leg wound, left   1. Leviamsole induced vasculitis of the right lower extremity: Patient was sent here from [place] [facility] due to the painful wound on the RLE to rule out osteomyelitis. It was about 3-4 cm in diater on thelateral RLE without surrounding erythema and without purulent drainage.  She did not exhibit systemic symptoms and was afebrile with normal white count. SHe had an xray done which showed no osseous infection but has had chronically elevated ESR and CRP. UDS was  positive for cocaine.  She then underwent an MRI which ruled out osteomyelitis, and suggested soft tissue inflammation consistent with vasculitis. She was then sent home with instructions to follow up with wound care and to follow up in our clinic.   Per chart review, patient has had recurrent vasculitis lesions since 2010, and has had confirmation of levimasole induced auto antibodies related to her ongoing cocaine use, which she admitted. Last year she was admitted for multiple ulceration on the face requiring surgical debridement, and had needed pulse dose steroids. She was recommended to follow up with Rheumatology, and she had not done that yet. We advised to the patient that she will need immunosuppressants due to the chronicity of her vasculitis and will benefit from Rheumatology referral. We will refer when she visits [place] in the clinic next week (hopefully).  Folliculitis: patient had mild tenderness and some papules in both of the axilla, which suggested folliculitis, and she was prescribed mupirocin cream.   Discharge Vitals:   BP 131/72 mmHg  Pulse 80  Temp(Src) 98.8 F (37.1 C) (Oral)  Resp 20  Ht 5' 1"  (1.549 m)  Wt 95 lb (43.092 kg)  BMI 17.96 kg/m2  SpO2 100%  Discharge Labs:  Results for orders placed or  performed during the hospital encounter of 06/04/15 (from the past 24 hour(s))  Basic metabolic panel     Status: None   Collection Time: 06/04/15  1:21 PM  Result Value Ref Range   Sodium 137 135 - 145 mmol/L   Potassium 3.8 3.5 - 5.1 mmol/L   Chloride 102 101 - 111 mmol/L   CO2 26 22 - 32 mmol/L   Glucose, Bld 90 65 - 99 mg/dL   BUN 18 6 - 20 mg/dL   Creatinine, Ser 0.63 0.44 - 1.00 mg/dL   Calcium 9.3 8.9 - 10.3 mg/dL   GFR calc non Af Amer >60 >60 mL/min   GFR calc Af Amer >60 >60 mL/min   Anion gap 9 5 - 15  CBC with Differential/Platelet     Status: Abnormal   Collection Time: 06/04/15  1:21 PM  Result Value Ref Range   WBC 4.1 4.0 - 10.5 K/uL   RBC 4.11 3.87 - 5.11 MIL/uL   Hemoglobin 11.7 (L) 12.0 - 15.0 g/dL   HCT 35.0 (L) 36.0 - 46.0 %   MCV 85.2 78.0 - 100.0 fL   MCH 28.5 26.0 - 34.0 pg   MCHC 33.4 30.0 - 36.0 g/dL   RDW 14.2 11.5 - 15.5 %   Platelets 324 150 - 400 K/uL   Neutrophils Relative % 72 43 - 77 %   Neutro Abs 3.0 1.7 - 7.7 K/uL   Lymphocytes Relative 24 12 - 46 %   Lymphs Abs 1.0 0.7 - 4.0 K/uL   Monocytes Relative 3 3 - 12 %   Monocytes Absolute 0.1 0.1 - 1.0 K/uL   Eosinophils Relative 1 0 - 5 %   Eosinophils Absolute 0.1 0.0 - 0.7 K/uL   Basophils Relative 0 0 - 1 %   Basophils Absolute 0.0 0.0 - 0.1 K/uL  Sedimentation rate     Status: Abnormal   Collection Time: 06/04/15  1:21 PM  Result Value Ref Range   Sed Rate 64 (H) 0 - 22 mm/hr  Urine rapid drug screen (hosp performed)     Status: Abnormal   Collection Time: 06/04/15  9:04 PM  Result Value Ref Range   Opiates POSITIVE (A) NONE DETECTED  Cocaine POSITIVE (A) NONE DETECTED   Benzodiazepines NONE DETECTED NONE DETECTED   Amphetamines NONE DETECTED NONE DETECTED   Tetrahydrocannabinol NONE DETECTED NONE DETECTED   Barbiturates NONE DETECTED NONE DETECTED  Basic metabolic panel     Status: Abnormal   Collection Time: 06/05/15  6:44 AM  Result Value Ref Range   Sodium 134 (L) 135 - 145  mmol/L   Potassium 3.2 (L) 3.5 - 5.1 mmol/L   Chloride 99 (L) 101 - 111 mmol/L   CO2 26 22 - 32 mmol/L   Glucose, Bld 87 65 - 99 mg/dL   BUN 11 6 - 20 mg/dL   Creatinine, Ser 0.66 0.44 - 1.00 mg/dL   Calcium 8.5 (L) 8.9 - 10.3 mg/dL   GFR calc non Af Amer >60 >60 mL/min   GFR calc Af Amer >60 >60 mL/min   Anion gap 9 5 - 15  CBC     Status: Abnormal   Collection Time: 06/05/15  6:44 AM  Result Value Ref Range   WBC 3.0 (L) 4.0 - 10.5 K/uL   RBC 3.65 (L) 3.87 - 5.11 MIL/uL   Hemoglobin 10.1 (L) 12.0 - 15.0 g/dL   HCT 30.6 (L) 36.0 - 46.0 %   MCV 83.8 78.0 - 100.0 fL   MCH 27.7 26.0 - 34.0 pg   MCHC 33.0 30.0 - 36.0 g/dL   RDW 13.9 11.5 - 15.5 %   Platelets 253 150 - 400 K/uL    Signed: Burgess Estelle, MD 06/05/2015, 11:44 AM    Services Ordered on Discharge:  Equipment Ordered on Discharge:

## 2015-06-05 NOTE — Progress Notes (Signed)
Pt has never been seem by Rheum/ Dr Ouida Sills  Appt referral to rheumatology will be made when she follows up in our clinic next week.

## 2015-06-05 NOTE — Progress Notes (Signed)
NURSING PROGRESS NOTE  [name] [id number] Discharge Data: 06/05/2015 5:44 PM Attending Provider: Axel Filler, MD [id number] Posey Pronto, MD     [name] to be D/C'd Home per MD order.  Discussed with the patient the After Visit Summary and all questions fully answered. All IV's discontinued with no bleeding noted. All belongings returned to patient for patient to take home.   Last Vital Signs:  Blood pressure 122/61, pulse 83, temperature 99 F (37.2 C), temperature source Oral, resp. rate 18, height 5' 1"  (1.549 m), weight 43.092 kg (95 lb), SpO2 100 %.  Discharge Medication List   Medication List    STOP taking these medications        naproxen 500 MG tablet  Commonly known as:  NAPROSYN      TAKE these medications        aspirin 81 MG EC tablet  Take 1 tablet (81 mg total) by mouth daily.     buPROPion 150 MG 24 hr tablet  Commonly known as:  WELLBUTRIN XL  Take 150 mg by mouth daily.     lidocaine 4 % external solution  Commonly known as:  XYLOCAINE  Apply topically 3 (three) times daily as needed for moderate pain.     lisinopril 10 MG tablet  Commonly known as:  PRINIVIL,ZESTRIL  Take 1 tablet (10 mg total) by mouth daily.     mupirocin ointment 2 %  Commonly known as:  BACTROBAN  Apply topically 3 (three) times daily.     simvastatin 20 MG tablet  Commonly known as:  ZOCOR  Take 1 tablet (20 mg total) by mouth daily at 6 PM.         Charolette Child, RN

## 2015-06-05 NOTE — Progress Notes (Signed)
   Subjective: No acute events overnight   Objective: Vitals stable Gen: Lying in bed, anxious  HEENT: conjunctival injection  CV: rrr Skin and extremities: multiple healed vasculitis lesions all over the extremities With crusting and hyperpigmentation Tender to palpation of the left lower extremity near the ankle- 2 cm nonhealing ulcerated wound without drainage or erythema , did not probe due to significant pain,  Also, small papules present in both of the axillae , likely folliculitis   Labs, imaging, micro and cultures if obtained, then are reviewed, and the pertinent ones are discussed either in Subjective or in the Assessment and Plan  A/P 54 year old woman with h/o HTN, depression, levimasole containing cocaine  and drug abuse leading to levimasole vasculitis who comes in with left lower leg pain that shows a chronic non healing wound, here to rule out osteomyelitis   Nonhealing ulcer of LLE, due to levimasole vasculitis, as other vasculitis lesions were present on the body.  -xrays show no osseous infection, but chronically elevated ESR in the 60s. -underwent MRI to rule out osteo- MRI shows no signs of osteomyelitis - will need to continue following up with wound care center in [facility].  -advised patient to stop using cocaine    Axillary papules- likely folliculitis -D/c doxy - explained to patient not shingles due to the bilateral nature of the folliculitis  -mupirocin ointment     Disposition- home today            Burgess Estelle, MD 06/05/2015, 10:31 AM

## 2015-06-05 NOTE — Care Management Note (Signed)
Case Management Note  Patient Details  Name: [name] MRN: [id number] Date of Birth: October 27, 1960  Subjective/Objective:                 Patient from home, lives with her brother. Polysubstance abuse. Cellulitis to leg.   Action/Plan:  Will follow up at wound clinic.  Expected Discharge Date:   (unknown)               Expected Discharge Plan:  Home/Self Care  In-House Referral:     Discharge planning Services  CM Consult  Post Acute Care Choice:    Choice offered to:     DME Arranged:    DME Agency:     HH Arranged:    [place] Agency:     Status of Service:  Completed, signed off  Medicare Important Message Given:    Date Medicare IM Given:    Medicare IM give by:    Date Additional Medicare IM Given:    Additional Medicare Important Message give by:     If discussed at [place] of Stay Meetings, dates discussed:    Additional Comments:  Carles Collet, RN 06/05/2015, 12:00 PM

## 2015-06-05 NOTE — Consult Note (Addendum)
WOC wound consult note Reason for Consult: Consult requested for left leg wounds and right elbow. Pt is familiar to [facility] team from several previous admission R/T levamisole induced vasculitis. She has been followed by the outpatient wound care center prior to admission for left leg wounds.Topical treatment is minimally effective for this chronic problem and it does not improve while the patient continues to use the substance. Wound type: Bilat ears with decreased amt chronic dry eschar, no open wounds, odor, or drainage.No topical treatment indicated at this time.  Measurement: Left leg with full thickness wounds; 4X.5X.2cm and 1.5X1.5X.2cm. Both are red and moist with mod amt red drainage, no odor, very painful to the touch.  Right elbow with full thickness wound; 1X.5X.1cm, red and moist with small amt yellow drainage, no odor. Foam dressing to left leg and elbow wounds to protect and promote healing.Pt can use Bactroban ointment, which is at the bedside, and gauze, which she states she has at home, to apply to dressing changes Q day after discharge. Discussed plan of care with patient and she verbalizes understanding. Please re-consult if further assistance is needed. Thank-you,  Julien Girt MSN, [place], [place], [place], [place]

## 2015-06-05 NOTE — Progress Notes (Signed)
Patient stated that "pain did ease up some" with pain medication. MD paged to update and to clarify if patient can discharge. Awaiting response.  5:36 PM MD stated they will not be prescribing patient narcotics and okay to take over the counter pain medications. Patient updated. MD order to discharge home. SW contacted to help arrange for transportation.

## 2015-06-08 ENCOUNTER — Encounter (HOSPITAL_COMMUNITY): Payer: Self-pay | Admitting: Emergency Medicine

## 2015-06-08 ENCOUNTER — Emergency Department (HOSPITAL_COMMUNITY)
Admission: EM | Admit: 2015-06-08 | Discharge: 2015-06-08 | Disposition: A | Discharge status: Home / Self Care | Payer: Medicaid Other | Attending: Emergency Medicine | Admitting: Emergency Medicine

## 2015-06-08 DIAGNOSIS — G43909 Migraine, unspecified, not intractable, without status migrainosus: Secondary | ICD-10-CM | POA: Insufficient documentation

## 2015-06-08 DIAGNOSIS — F329 Major depressive disorder, single episode, unspecified: Secondary | ICD-10-CM | POA: Diagnosis not present

## 2015-06-08 DIAGNOSIS — Z792 Long term (current) use of antibiotics: Secondary | ICD-10-CM | POA: Insufficient documentation

## 2015-06-08 DIAGNOSIS — Y9289 Other specified places as the place of occurrence of the external cause: Secondary | ICD-10-CM | POA: Insufficient documentation

## 2015-06-08 DIAGNOSIS — Z8739 Personal history of other diseases of the musculoskeletal system and connective tissue: Secondary | ICD-10-CM | POA: Diagnosis not present

## 2015-06-08 DIAGNOSIS — L0291 Cutaneous abscess, unspecified: Secondary | ICD-10-CM

## 2015-06-08 DIAGNOSIS — X58XXXA Exposure to other specified factors, initial encounter: Secondary | ICD-10-CM | POA: Insufficient documentation

## 2015-06-08 DIAGNOSIS — Z862 Personal history of diseases of the blood and blood-forming organs and certain disorders involving the immune mechanism: Secondary | ICD-10-CM | POA: Diagnosis not present

## 2015-06-08 DIAGNOSIS — Z7982 Long term (current) use of aspirin: Secondary | ICD-10-CM | POA: Diagnosis not present

## 2015-06-08 DIAGNOSIS — Z8701 Personal history of pneumonia (recurrent): Secondary | ICD-10-CM | POA: Diagnosis not present

## 2015-06-08 DIAGNOSIS — I10 Essential (primary) hypertension: Secondary | ICD-10-CM | POA: Diagnosis not present

## 2015-06-08 DIAGNOSIS — S81802A Unspecified open wound, left lower leg, initial encounter: Secondary | ICD-10-CM | POA: Diagnosis not present

## 2015-06-08 DIAGNOSIS — Y9389 Activity, other specified: Secondary | ICD-10-CM | POA: Diagnosis not present

## 2015-06-08 DIAGNOSIS — L02416 Cutaneous abscess of left lower limb: Secondary | ICD-10-CM | POA: Diagnosis not present

## 2015-06-08 DIAGNOSIS — Y998 Other external cause status: Secondary | ICD-10-CM | POA: Diagnosis not present

## 2015-06-08 DIAGNOSIS — Z79899 Other long term (current) drug therapy: Secondary | ICD-10-CM | POA: Diagnosis not present

## 2015-06-08 DIAGNOSIS — L089 Local infection of the skin and subcutaneous tissue, unspecified: Secondary | ICD-10-CM | POA: Diagnosis present

## 2015-06-08 LAB — BASIC METABOLIC PANEL
ANION GAP: 9 (ref 5–15)
BUN: 13 mg/dL (ref 6–20)
CALCIUM: 9.1 mg/dL (ref 8.9–10.3)
CO2: 26 mmol/L (ref 22–32)
CREATININE: 0.82 mg/dL (ref 0.44–1.00)
Chloride: 102 mmol/L (ref 101–111)
Glucose, Bld: 206 mg/dL — ABNORMAL HIGH (ref 65–99)
Potassium: 3.4 mmol/L — ABNORMAL LOW (ref 3.5–5.1)
SODIUM: 137 mmol/L (ref 135–145)

## 2015-06-08 LAB — CBC WITH DIFFERENTIAL/PLATELET
BASOS ABS: 0 10*3/uL (ref 0.0–0.1)
BASOS PCT: 0 % (ref 0–1)
EOS ABS: 0 10*3/uL (ref 0.0–0.7)
Eosinophils Relative: 1 % (ref 0–5)
HEMATOCRIT: 34.6 % — AB (ref 36.0–46.0)
Hemoglobin: 11.5 g/dL — ABNORMAL LOW (ref 12.0–15.0)
Lymphocytes Relative: 26 % (ref 12–46)
Lymphs Abs: 1 10*3/uL (ref 0.7–4.0)
MCH: 27.9 pg (ref 26.0–34.0)
MCHC: 33.2 g/dL (ref 30.0–36.0)
MCV: 84 fL (ref 78.0–100.0)
MONO ABS: 0.1 10*3/uL (ref 0.1–1.0)
Monocytes Relative: 1 % — ABNORMAL LOW (ref 3–12)
NEUTROS ABS: 2.8 10*3/uL (ref 1.7–7.7)
NEUTROS PCT: 72 % (ref 43–77)
Platelets: 358 10*3/uL (ref 150–400)
RBC: 4.12 MIL/uL (ref 3.87–5.11)
RDW: 13.8 % (ref 11.5–15.5)
WBC: 3.8 10*3/uL — ABNORMAL LOW (ref 4.0–10.5)

## 2015-06-08 LAB — SEDIMENTATION RATE: SED RATE: 57 mm/h — AB (ref 0–22)

## 2015-06-08 LAB — C-REACTIVE PROTEIN: CRP: 4.2 mg/dL — AB (ref <1.0)

## 2015-06-08 MED ORDER — LIDOCAINE-EPINEPHRINE 2 %-1:100000 IJ SOLN
20.0000 mL | Freq: Once | INTRAMUSCULAR | Status: DC
  Filled 2015-06-08: qty 1

## 2015-06-08 MED ORDER — SULFAMETHOXAZOLE-TRIMETHOPRIM 800-160 MG PO TABS: 1.0000 | ORAL_TABLET | Freq: Two times a day (BID) | ORAL | Status: DC

## 2015-06-08 MED ORDER — HYDROMORPHONE HCL 1 MG/ML IJ SOLN
0.5000 mg | Freq: Once | INTRAMUSCULAR | Status: AC
  Administered 2015-06-08: 0.5 mg via INTRAVENOUS
  Filled 2015-06-08: qty 1

## 2015-06-08 MED ORDER — ACYCLOVIR 800 MG PO TABS: 800.0000 mg | ORAL_TABLET | Freq: Every day | ORAL | Status: DC

## 2015-06-08 NOTE — ED Notes (Signed)
Bed: HE17 Expected date:  Expected time:  Means of arrival:  Comments: EMS- foot wound

## 2015-06-08 NOTE — ED Notes (Signed)
PER EMS- pt picked from streets c/o wound infection located on l left leg. [facility] and was rx'd topical medication medication.  Reports no improvement and increased pain. Hx of HTN.  Arrived to ED alert and oriented x4 per baseline. PT is ambulatory. Reports 10/10 pain.

## 2015-06-08 NOTE — ED Notes (Signed)
Nurse drawing labs. 

## 2015-06-08 NOTE — Discharge Instructions (Signed)
Abscess An abscess is an infected area that contains a collection of pus and debris.It can occur in almost any part of the body. An abscess is also known as a furuncle or boil. CAUSES  An abscess occurs when tissue gets infected. This can occur from blockage of oil or sweat glands, infection of hair follicles, or a minor injury to the skin. As the body tries to fight the infection, pus collects in the area and creates pressure under the skin. This pressure causes pain. People with weakened immune systems have difficulty fighting infections and get certain abscesses more often.  SYMPTOMS Usually an abscess develops on the skin and becomes a painful mass that is red, warm, and tender. If the abscess forms under the skin, you may feel a moveable soft area under the skin. Some abscesses break open (rupture) on their own, but most will continue to get worse without care. The infection can spread deeper into the body and eventually into the bloodstream, causing you to feel ill.  DIAGNOSIS  Your caregiver will take your medical history and perform a physical exam. A sample of fluid may also be taken from the abscess to determine what is causing your infection. TREATMENT  Your caregiver may prescribe antibiotic medicines to fight the infection. However, taking antibiotics alone usually does not cure an abscess. Your caregiver may need to make a small cut (incision) in the abscess to drain the pus. In some cases, gauze is packed into the abscess to reduce pain and to continue draining the area. HOME CARE INSTRUCTIONS   Only take over-the-counter or prescription medicines for pain, discomfort, or fever as directed by your caregiver.  If you were prescribed antibiotics, take them as directed. Finish them even if you start to feel better.  If gauze is used, follow your caregiver's directions for changing the gauze.  To avoid spreading the infection:  Keep your draining abscess covered with a  bandage.  Wash your hands well.  Do not share personal care items, towels, or whirlpools with others.  Avoid skin contact with others.  Keep your skin and clothes clean around the abscess.  Keep all follow-up appointments as directed by your caregiver. SEEK MEDICAL CARE IF:   You have increased pain, swelling, redness, fluid drainage, or bleeding.  You have muscle aches, chills, or a general ill feeling.  You have a fever. MAKE SURE YOU:   Understand these instructions.  Will watch your condition.  Will get help right away if you are not doing well or get worse. Document Released: 07/01/2005 Document Revised: 03/22/2012 Document Reviewed: 12/04/2011 [facility] Patient Information 2015 [place], [place]. This information is not intended to replace advice given to you by your health care provider. Make sure you discuss any questions you have with your health care provider. Shingles Shingles (herpes zoster) is an infection that is caused by the same virus that causes chickenpox (varicella). The infection causes a painful skin rash and fluid-filled blisters, which eventually break open, crust over, and heal. It may occur in any area of the body, but it usually affects only one side of the body or face. The pain of shingles usually lasts about 1 month. However, some people with shingles may develop long-term (chronic) pain in the affected area of the body. Shingles often occurs many years after the person had chickenpox. It is more common:  In people older than 50 years.  In people with weakened immune systems, such as those with HIV, AIDS, or cancer.  In people taking medicines that weaken the immune system, such as transplant medicines.  In people under great stress. CAUSES  Shingles is caused by the varicella zoster virus (VZV), which also causes chickenpox. After a person is infected with the virus, it can remain in the person's body for years in an inactive state (dormant). To  cause shingles, the virus reactivates and breaks out as an infection in a nerve root. The virus can be spread from person to person (contagious) through contact with open blisters of the shingles rash. It will only spread to people who have not had chickenpox. When these people are exposed to the virus, they may develop chickenpox. They will not develop shingles. Once the blisters scab over, the person is no longer contagious and cannot spread the virus to others. SIGNS AND SYMPTOMS  Shingles shows up in stages. The initial symptoms may be pain, itching, and tingling in an area of the skin. This pain is usually described as burning, stabbing, or throbbing.In a few days or weeks, a painful red rash will appear in the area where the pain, itching, and tingling were felt. The rash is usually on one side of the body in a band or belt-like pattern. Then, the rash usually turns into fluid-filled blisters. They will scab over and dry up in approximately 2-3 weeks. Flu-like symptoms may also occur with the initial symptoms, the rash, or the blisters. These may include:  Fever.  Chills.  Headache.  Upset stomach. DIAGNOSIS  Your health care provider will perform a skin exam to diagnose shingles. Skin scrapings or fluid samples may also be taken from the blisters. This sample will be examined under a microscope or sent to a lab for further testing. TREATMENT  There is no specific cure for shingles. Your health care provider will likely prescribe medicines to help you manage the pain, recover faster, and avoid long-term problems. This may include antiviral drugs, anti-inflammatory drugs, and pain medicines. HOME CARE INSTRUCTIONS   Take a cool bath or apply cool compresses to the area of the rash or blisters as directed. This may help with the pain and itching.   Take medicines only as directed by your health care provider.   Rest as directed by your health care provider.  Keep your rash and  blisters clean with mild soap and cool water or as directed by your health care provider.  Do not pick your blisters or scratch your rash. Apply an anti-itch cream or numbing creams to the affected area as directed by your health care provider.  Keep your shingles rash covered with a loose bandage (dressing).  Avoid skin contact with:  Babies.   Pregnant women.   Children with eczema.   Elderly people with transplants.   People with chronic illnesses, such as leukemia or AIDS.   Wear loose-fitting clothing to help ease the pain of material rubbing against the rash.  Keep all follow-up visits as directed by your health care provider.If the area involved is on your face, you may receive a referral for a specialist, such as an eye doctor (ophthalmologist) or an ear, nose, and throat (ENT) doctor. Keeping all follow-up visits will help you avoid eye problems, chronic pain, or disability.  SEEK IMMEDIATE MEDICAL CARE IF:   You have facial pain, pain around the eye area, or loss of feeling on one side of your face.  You have ear pain or ringing in your ear.  You have loss of taste.  Your pain  is not relieved with prescribed medicines.   Your redness or swelling spreads.   You have more pain and swelling.  Your condition is worsening or has changed.   You have a fever. MAKE SURE YOU:  Understand these instructions.  Will watch your condition.  Will get help right away if you are not doing well or get worse. Document Released: 09/21/2005 Document Revised: 02/05/2014 Document Reviewed: 05/05/2012 [facility] Patient Information 2015 [place], [place]. This information is not intended to replace advice given to you by your health care provider. Make sure you discuss any questions you have with your health care provider.

## 2015-06-08 NOTE — ED Provider Notes (Signed)
CSN: [id number]     Arrival date & time 06/08/15  1123 History   First MD Initiated Contact with Patient 06/08/15 1132     Chief Complaint  Patient presents with  . Wound Infection     (Consider location/radiation/quality/duration/timing/severity/associated sxs/prior Treatment) Patient is a 54 y.o. female presenting with leg pain.  Leg Pain Lower extremity pain location: L lower extremity. Injury: no   Pain details:    Quality:  Aching   Radiates to:  Does not radiate   Severity:  Severe   Onset quality:  Gradual   Duration:  1 day   Timing:  Constant   Progression:  Unchanged Chronicity:  New Foreign body present:  No foreign bodies Prior injury to area:  No Relieved by:  Nothing Exacerbated by: palpation, walking. Ineffective treatments:  None tried Associated symptoms: swelling   Associated symptoms: no back pain, no decreased ROM, no fever, no itching, no neck pain and no numbness     Past Medical History  Diagnosis Date  . 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 Followed by Dr. Louanne Skye  . Hypertension   . Cocaine abuse     ongoing with resultant vaculitis.  . Depression   . Normocytic anemia     BL Hgb 9.8-12. Last anemia panel 04/2010 - showing Fe 19, ferritin 101.  Pt on monthly B12 injections  . 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 CAP (community acquired pneumonia) 03/2014 X 2  . Daily headache   . Migraines     "probably 5-6/yr" (04/21/2014)  . Rheumatoid arthritis(714.0)     patient reported  . Inflammatory arthritis    Past Surgical History  Procedure Laterality Date  . Skin biopsy Bilateral 04/2010    shin nodules  . Irrigation and debridement abscess Bilateral 09/26/2013    Procedure: DEBRIDEMENT ULCERS BILATERAL THIGHS;  Surgeon: Gwenyth Ober, MD;  Location: [facility];  Service: General;  Laterality: Bilateral;  . Hernia repair      "stomach"   Family History  Problem Relation Age of Onset  . Breast cancer Mother      Breast cancer  . Alcohol abuse Mother   . Colon cancer Maternal Aunt 66  . Alcohol abuse Father    Social History  Substance Use Topics  . Smoking status: Current Every Day Smoker -- 0.30 packs/day for 34 years    Types: Cigarettes  . Smokeless tobacco: Never Used     Comment: "Trying very hard".  . Alcohol Use: No   OB History    No data available     Review of Systems  Constitutional: Negative for fever.  Musculoskeletal: Negative for back pain and neck pain.  Skin: Negative for itching.  All other systems reviewed and are negative.     Allergies  Acetaminophen  Home Medications   Prior to Admission medications   Medication Sig Start Date End Date Taking? Authorizing Provider  buPROPion (WELLBUTRIN XL) 150 MG 24 hr tablet Take 1 tablet (150 mg total) by mouth daily. 06/05/15  Yes Loleta Chance, MD  lisinopril (PRINIVIL,ZESTRIL) 10 MG tablet Take 1 tablet (10 mg total) by mouth daily. Patient taking differently: Take 10 mg by mouth daily as needed (for high blood pressure).  03/11/15  Yes Kelby Aline, MD  acyclovir (ZOVIRAX) 800 MG tablet Take 1 tablet (800 mg total) by mouth 5 (five) times daily. 06/08/15   Debby Freiberg, MD  aspirin EC  81 MG EC tablet Take 1 tablet (81 mg total) by mouth daily. Patient not taking: Reported on 04/21/2015 04/08/15   Milagros Loll, MD  aspirin EC 81 MG EC tablet Take 1 tablet (81 mg total) by mouth daily. Patient not taking: Reported on 06/08/2015 06/05/15   Loleta Chance, MD  lidocaine (XYLOCAINE) 4 % external solution Apply topically 3 (three) times daily as needed for moderate pain. Patient not taking: Reported on 05/23/2015 04/22/15   Milagros Loll, MD  lisinopril (PRINIVIL,ZESTRIL) 10 MG tablet Take 1 tablet (10 mg total) by mouth daily. Patient not taking: Reported on 06/08/2015 06/05/15   Loleta Chance, MD  mupirocin ointment (BACTROBAN) 2 % Apply topically 3 (three) times daily. Patient not taking: Reported on 06/08/2015 06/05/15   Burgess Estelle, MD  simvastatin (ZOCOR) 20 MG tablet Take 1 tablet (20 mg total) by mouth daily at 6 PM. Patient not taking: Reported on 05/23/2015 04/22/15   Milagros Loll, MD  simvastatin (ZOCOR) 20 MG tablet Take 1 tablet (20 mg total) by mouth daily at 6 PM. Patient not taking: Reported on 06/08/2015 06/05/15   Loleta Chance, MD  sulfamethoxazole-trimethoprim (BACTRIM DS,SEPTRA DS) 800-160 MG per tablet Take 1 tablet by mouth 2 (two) times daily. 06/08/15   Debby Freiberg, MD   BP 157/76 mmHg  Pulse 100  Temp(Src) 99.6 F (37.6 C) (Oral)  Resp 20  SpO2 100% Physical Exam  Constitutional: She is oriented to person, place, and time. She appears well-developed and well-nourished.  HENT:  Head: Normocephalic and atraumatic.  Right Ear: External ear normal.  Left Ear: External ear normal.  Eyes: Conjunctivae and EOM are normal. Pupils are equal, round, and reactive to light.  Neck: Normal range of motion. Neck supple.  Cardiovascular: Normal rate, regular rhythm, normal heart sounds and intact distal pulses.   Pulmonary/Chest: Effort normal and breath sounds normal.  Abdominal: Soft. Bowel sounds are normal. There is no tenderness.  Musculoskeletal: Normal range of motion.  Neurological: She is alert and oriented to person, place, and time.  Skin: Skin is warm and dry.  Multiple ulcerations to L lower extremity with 2 cm fluctuant painful area, no surrounding warmth or erythema  Vitals reviewed.   ED Course  INCISION AND DRAINAGE Date/Time: 06/09/2015 7:08 AM Performed by: Debby Freiberg Authorized by: Debby Freiberg Consent: Verbal consent obtained. Risks and benefits: risks, benefits and alternatives were discussed Type: cyst Location: L lateral lower extremity. Anesthesia: local infiltration Local anesthetic: lidocaine 2% with epinephrine Patient sedated: no Scalpel size: 11 Incision type: single straight Incision depth: subcutaneous Complexity: simple Drainage: serous and   bloody Drainage amount: scant Wound treatment: wound left open Patient tolerance: Patient tolerated the procedure well with no immediate complications   (including critical care time) Labs Review Labs Reviewed  CBC WITH DIFFERENTIAL/PLATELET - Abnormal; Notable for the following:    WBC 3.8 (*)    Hemoglobin 11.5 (*)    HCT 34.6 (*)    Monocytes Relative 1 (*)    All other components within normal limits  BASIC METABOLIC PANEL - Abnormal; Notable for the following:    Potassium 3.4 (*)    Glucose, Bld 206 (*)    All other components within normal limits  SEDIMENTATION RATE - Abnormal; Notable for the following:    Sed Rate 57 (*)    All other components within normal limits  C-REACTIVE PROTEIN - Abnormal; Notable for the following:    CRP 4.2 (*)    All  other components within normal limits  WOUND CULTURE    Imaging Review No results found. I have personally reviewed and evaluated these images and lab results as part of my medical decision-making.   EKG Interpretation None      MDM   Final diagnoses:  Leg wound, left, initial encounter  Abscess    54 y.o. female with pertinent PMH of cocaine vs levimasole vasculitis, recent admission for nonhealing L leg wound presents with acute on chronic L leg pain.  On arrival vitals and physical exam as above.  2 fluctuant pockets present in the lower extremity with concern for possible abscess, however minimal signs of surrounding erythema.  Drained as above, primarily serous drainage but with small areas of purulence expressed from area.  No systemic signs indicative of sepsis.  Pt tolerating PO in dept.  DC home with bactrim.      I have reviewed all laboratory and imaging studies if ordered as above  1. Leg wound, left, initial encounter   2. Abscess         Debby Freiberg, MD 06/09/15 [phone]

## 2015-06-11 ENCOUNTER — Telehealth: Payer: Self-pay | Admitting: Internal Medicine

## 2015-06-11 LAB — WOUND CULTURE: Gram Stain: NONE SEEN

## 2015-06-11 NOTE — Telephone Encounter (Signed)
Call to patient to confirm appointment for 06/12/15 at 1:45 lmtcb

## 2015-06-12 ENCOUNTER — Encounter: Payer: Self-pay | Admitting: Internal Medicine

## 2015-06-14 ENCOUNTER — Inpatient Hospital Stay (HOSPITAL_COMMUNITY): Payer: Medicaid Other

## 2015-06-14 ENCOUNTER — Inpatient Hospital Stay (HOSPITAL_COMMUNITY)
Admission: EM | Admit: 2015-06-14 | Discharge: 2015-06-17 | DRG: 545 | Disposition: A | Discharge status: Home / Self Care | Payer: Medicaid Other | Attending: Internal Medicine | Admitting: Internal Medicine

## 2015-06-14 ENCOUNTER — Encounter (HOSPITAL_COMMUNITY): Payer: Self-pay | Admitting: Emergency Medicine

## 2015-06-14 ENCOUNTER — Emergency Department (HOSPITAL_COMMUNITY): Payer: Medicaid Other

## 2015-06-14 DIAGNOSIS — L97929 Non-pressure chronic ulcer of unspecified part of left lower leg with unspecified severity: Secondary | ICD-10-CM | POA: Diagnosis present

## 2015-06-14 DIAGNOSIS — I776 Arteritis, unspecified: Secondary | ICD-10-CM | POA: Diagnosis not present

## 2015-06-14 DIAGNOSIS — F1410 Cocaine abuse, uncomplicated: Secondary | ICD-10-CM | POA: Diagnosis present

## 2015-06-14 DIAGNOSIS — B029 Zoster without complications: Secondary | ICD-10-CM | POA: Diagnosis present

## 2015-06-14 DIAGNOSIS — Z886 Allergy status to analgesic agent status: Secondary | ICD-10-CM | POA: Diagnosis not present

## 2015-06-14 DIAGNOSIS — I10 Essential (primary) hypertension: Secondary | ICD-10-CM | POA: Diagnosis present

## 2015-06-14 DIAGNOSIS — F329 Major depressive disorder, single episode, unspecified: Secondary | ICD-10-CM | POA: Diagnosis present

## 2015-06-14 DIAGNOSIS — R6883 Chills (without fever): Secondary | ICD-10-CM | POA: Diagnosis present

## 2015-06-14 DIAGNOSIS — Z7982 Long term (current) use of aspirin: Secondary | ICD-10-CM

## 2015-06-14 DIAGNOSIS — L02511 Cutaneous abscess of right hand: Secondary | ICD-10-CM | POA: Diagnosis present

## 2015-06-14 DIAGNOSIS — I999 Unspecified disorder of circulatory system: Secondary | ICD-10-CM

## 2015-06-14 DIAGNOSIS — F14188 Cocaine abuse with other cocaine-induced disorder: Secondary | ICD-10-CM | POA: Diagnosis present

## 2015-06-14 DIAGNOSIS — E43 Unspecified severe protein-calorie malnutrition: Secondary | ICD-10-CM | POA: Diagnosis present

## 2015-06-14 DIAGNOSIS — Z681 Body mass index (BMI) 19 or less, adult: Secondary | ICD-10-CM

## 2015-06-14 DIAGNOSIS — K5900 Constipation, unspecified: Secondary | ICD-10-CM | POA: Diagnosis present

## 2015-06-14 DIAGNOSIS — Z79899 Other long term (current) drug therapy: Secondary | ICD-10-CM

## 2015-06-14 DIAGNOSIS — L0291 Cutaneous abscess, unspecified: Secondary | ICD-10-CM | POA: Diagnosis present

## 2015-06-14 DIAGNOSIS — T148XXA Other injury of unspecified body region, initial encounter: Secondary | ICD-10-CM

## 2015-06-14 DIAGNOSIS — G43909 Migraine, unspecified, not intractable, without status migrainosus: Secondary | ICD-10-CM | POA: Diagnosis present

## 2015-06-14 DIAGNOSIS — F14288 Cocaine dependence with other cocaine-induced disorder: Secondary | ICD-10-CM

## 2015-06-14 DIAGNOSIS — L739 Follicular disorder, unspecified: Secondary | ICD-10-CM | POA: Diagnosis present

## 2015-06-14 DIAGNOSIS — L958 Other vasculitis limited to the skin: Secondary | ICD-10-CM

## 2015-06-14 DIAGNOSIS — L089 Local infection of the skin and subcutaneous tissue, unspecified: Secondary | ICD-10-CM | POA: Diagnosis present

## 2015-06-14 DIAGNOSIS — F14988 Cocaine use, unspecified with other cocaine-induced disorder: Secondary | ICD-10-CM | POA: Diagnosis present

## 2015-06-14 DIAGNOSIS — L97909 Non-pressure chronic ulcer of unspecified part of unspecified lower leg with unspecified severity: Secondary | ICD-10-CM | POA: Insufficient documentation

## 2015-06-14 DIAGNOSIS — F17210 Nicotine dependence, cigarettes, uncomplicated: Secondary | ICD-10-CM | POA: Diagnosis present

## 2015-06-14 LAB — CBC WITH DIFFERENTIAL/PLATELET
Basophils Absolute: 0 10*3/uL (ref 0.0–0.1)
Basophils Relative: 0 % (ref 0–1)
EOS ABS: 0.2 10*3/uL (ref 0.0–0.7)
EOS PCT: 4 % (ref 0–5)
HCT: 29.9 % — ABNORMAL LOW (ref 36.0–46.0)
Hemoglobin: 9.9 g/dL — ABNORMAL LOW (ref 12.0–15.0)
LYMPHS ABS: 1.6 10*3/uL (ref 0.7–4.0)
LYMPHS PCT: 30 % (ref 12–46)
MCH: 27.7 pg (ref 26.0–34.0)
MCHC: 33.1 g/dL (ref 30.0–36.0)
MCV: 83.8 fL (ref 78.0–100.0)
MONOS PCT: 4 % (ref 3–12)
Monocytes Absolute: 0.2 10*3/uL (ref 0.1–1.0)
Neutro Abs: 3.3 10*3/uL (ref 1.7–7.7)
Neutrophils Relative %: 62 % (ref 43–77)
PLATELETS: 386 10*3/uL (ref 150–400)
RBC: 3.57 MIL/uL — ABNORMAL LOW (ref 3.87–5.11)
RDW: 13.8 % (ref 11.5–15.5)
WBC: 5.2 10*3/uL (ref 4.0–10.5)

## 2015-06-14 LAB — URINE MICROSCOPIC-ADD ON

## 2015-06-14 LAB — BASIC METABOLIC PANEL
Anion gap: 9 (ref 5–15)
BUN: 13 mg/dL (ref 6–20)
CHLORIDE: 103 mmol/L (ref 101–111)
CO2: 25 mmol/L (ref 22–32)
CREATININE: 0.75 mg/dL (ref 0.44–1.00)
Calcium: 8.7 mg/dL — ABNORMAL LOW (ref 8.9–10.3)
GFR calc Af Amer: >60 mL/min (ref >60)
GFR calc non Af Amer: >60 mL/min (ref >60)
GLUCOSE: 117 mg/dL — AB (ref 65–99)
Potassium: 3.6 mmol/L (ref 3.5–5.1)
SODIUM: 137 mmol/L (ref 135–145)

## 2015-06-14 LAB — URINALYSIS, ROUTINE W REFLEX MICROSCOPIC
BILIRUBIN URINE: NEGATIVE
GLUCOSE, UA: NEGATIVE mg/dL
Ketones, ur: NEGATIVE mg/dL
Leukocytes, UA: NEGATIVE
Nitrite: NEGATIVE
PH: 6.5 (ref 5.0–8.0)
Protein, ur: NEGATIVE mg/dL
SPECIFIC GRAVITY, URINE: 1.017 (ref 1.005–1.030)
UROBILINOGEN UA: 1 mg/dL (ref 0.0–1.0)

## 2015-06-14 LAB — RAPID URINE DRUG SCREEN, HOSP PERFORMED
AMPHETAMINES: NOT DETECTED
BENZODIAZEPINES: NOT DETECTED
Barbiturates: NOT DETECTED
Cocaine: POSITIVE — AB
OPIATES: NOT DETECTED
TETRAHYDROCANNABINOL: NOT DETECTED

## 2015-06-14 MED ORDER — CLINDAMYCIN PHOSPHATE 600 MG/50ML IV SOLN
600.0000 mg | Freq: Once | INTRAVENOUS | Status: AC
  Administered 2015-06-14: 600 mg via INTRAVENOUS
  Filled 2015-06-14: qty 50

## 2015-06-14 MED ORDER — OXYCODONE HCL 5 MG PO TABS
5.0000 mg | ORAL_TABLET | Freq: Once | ORAL | Status: AC
  Administered 2015-06-14: 5 mg via ORAL
  Filled 2015-06-14: qty 1

## 2015-06-14 NOTE — ED Notes (Signed)
Pt returned from xray, report called to the floor.

## 2015-06-14 NOTE — ED Notes (Signed)
Pt from home for eval of left leg infection, pt states she had boils removed at [facility] ed last week. Pt states she has been taking her antibiotics as prescribed but has noticed odor and increased bleeding. Drainage and foul odor noted by triage RN. Denies any n/v/d or fevers. Pt tearful in triage, states she wasn't given any pain prescription.

## 2015-06-14 NOTE — H&P (Signed)
Date: 06/15/2015               Patient Name:  [name] MRN: [id number]  DOB: May 20, 1961 Age / Sex: 54 y.o., female   PCP: Zada Finders, MD         Medical Service: Internal Medicine Teaching Service         Attending Physician: Dr. Oval Linsey, MD    First Contact: Dr. Juleen China Pager: [phone]  Second Contact: Dr. Genene Churn Pager: [phone]       After Hours (After 5p/  First Contact Pager: [phone]  weekends / holidays): Second Contact Pager: [phone]   Chief Complaint: painful and malodorous LLE wound  History of Present Illness: [name] is a 54 y.o. 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  Patient was admitted to [facility] on 8/30. MRI was negative for osteomyelitis and patient was discharged without antibiotics.  She has been followed by wound care as an outpatient.  Last week, wound care sent patient to [facility] ED for evaluation of abscesses of LLE wounds.  Patient had abscesses I&D'd, and was sent home with Bactrim (9/3).  Patient did not fill prescription until 9/7, and has only taken 3 doses.  Yesterday (9/8), the wound started to smell bad, and patient took a bleach bath before applying a new bandage with Silvadene.  This morning, patient reports pain and smell had become unbearable, and she presented to ED.   In addition to her LLE, her right 2nd finger is swollen at the nail bed.  This swelling first developed 2 days ago.  She endorses pain with flexion and extension of the finger.  She has an healing wound to the left thenar eminence.  Patient also reports folliculitis flare draining serosanguinous fluid in her axillae.  Sometimes she will try to pinch the follicles to get them to pop.  She also reports a shingles flare in her left axilla.  She states she gets these flares every month, when her axilla becomes red, odorous, and  the wounds drain yellow pus.  She describes the pain as achy and itchy. She denies burning or sharp pains.   She endorses occasional chills and constipation.  She denies fever, CP, SOB, abdominal pain, or trouble urinating.    Her last reported cocaine use was 2-3 days ago. She smokes 2-3 cigarettes/day.  She denies alcohol use.   Meds: No current facility-administered medications for this encounter.   Current Outpatient Prescriptions  Medication Sig Dispense Refill  . acyclovir (ZOVIRAX) 800 MG tablet Take 1 tablet (800 mg total) by mouth 5 (five) times daily. 50 tablet 0  . lisinopril (PRINIVIL,ZESTRIL) 10 MG tablet Take 1 tablet (10 mg total) by mouth daily. (Patient taking differently: Take 10 mg by mouth daily as needed (for high blood pressure). ) 30 tablet 0  . mupirocin ointment (BACTROBAN) 2 % APPLY TO AFFECTED AREA 3 TIMES DAILY  0  . sulfamethoxazole-trimethoprim (BACTRIM DS,SEPTRA DS) 800-160 MG per tablet Take 1 tablet by mouth 2 (two) times daily. 14 tablet 0  . aspirin EC 81 MG EC tablet Take 1 tablet (81 mg total) by mouth daily. (Patient not taking: Reported on 04/21/2015) 30 tablet 0  . aspirin EC 81 MG EC tablet Take 1 tablet (81 mg total) by mouth daily. (Patient not taking: Reported on 06/08/2015) 90 tablet 3  . buPROPion (  WELLBUTRIN XL) 150 MG 24 hr tablet Take 150 mg by mouth daily.  0  . simvastatin (ZOCOR) 20 MG tablet Take 1 tablet (20 mg total) by mouth daily at 6 PM. (Patient not taking: Reported on 05/23/2015) 30 tablet 0  . simvastatin (ZOCOR) 20 MG tablet Take 1 tablet (20 mg total) by mouth daily at 6 PM. (Patient not taking: Reported on 06/08/2015) 30 tablet 0    Allergies: Allergies as of 06/14/2015 - Review Complete 06/14/2015  Allergen Reaction Noted  . Acetaminophen Swelling    Past Medical History  Diagnosis Date  . 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 Followed by Dr. Louanne Skye  . Hypertension   . Cocaine abuse     ongoing with resultant vaculitis.  .  Depression   . Normocytic anemia     BL Hgb 9.8-12. Last anemia panel 04/2010 - showing Fe 19, ferritin 101.  Pt on monthly B12 injections  . 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 CAP (community acquired pneumonia) 03/2014 X 2  . Daily headache   . Migraines     "probably 5-6/yr" (04/21/2014)  . Rheumatoid arthritis(714.0)     patient reported  . Inflammatory arthritis    Past Surgical History  Procedure Laterality Date  . Skin biopsy Bilateral 04/2010    shin nodules  . Irrigation and debridement abscess Bilateral 09/26/2013    Procedure: DEBRIDEMENT ULCERS BILATERAL THIGHS;  Surgeon: Gwenyth Ober, MD;  Location: [facility];  Service: General;  Laterality: Bilateral;  . Hernia repair      "stomach"   Family History  Problem Relation Age of Onset  . Breast cancer Mother     Breast cancer  . Alcohol abuse Mother   . Colon cancer Maternal Aunt 72  . Alcohol abuse Father    Social History   Social History  . Marital Status: Single    Spouse Name: N/A  . Number of Children: 0  . Years of Education: 11th grade   Occupational History  . Disability     since 2011, due to her rheumatoid arthritis   Social History Main Topics  . Smoking status: Current Every Day Smoker -- 0.30 packs/day for 34 years    Types: Cigarettes  . Smokeless tobacco: Never Used     Comment: "Trying very hard".  . Alcohol Use: No  . Drug Use: Yes    Special: "Crack" cocaine, Cocaine     Comment: States she still uses crack "a little bit".  . Sexual Activity: No   Other Topics Concern  . Not on file   Social History Narrative   Unemployed:  cleaning in past   Living at [place]; has Medicaid.   crack/cocaine use; pt denies IVDU   tobacco:  1/2 ppd, trying to quit   alcohol:  none        Review of Systems: Pertinent items are noted in HPI.  Physical Exam: Blood pressure 163/94, pulse 99, temperature 98 F (36.7 C), temperature source Oral, resp. rate  16, height 5' 1"  (1.549 m), weight 99 lb (44.906 kg), SpO2 100 %. Physical Exam  Constitutional: She is oriented to person, place, and time.  Frail, appears older than stated age. NAD  HENT:  Ears missing superior portion of helix.  Eyes: EOM are normal. No scleral icterus.  Neck: Normal range of motion. No tracheal deviation present.  Cardiovascular: Normal rate, regular rhythm, normal heart sounds  and intact distal pulses.   Pulmonary/Chest: Effort normal and breath sounds normal. No respiratory distress. She has no wheezes.  Abdominal: Soft. She exhibits no distension. There is no tenderness. There is no rebound and no guarding.  Musculoskeletal: She exhibits no edema.  Neurological: She is alert and oriented to person, place, and time.  Skin:  Lateral LLE: Three 0.5-1cm wounds with pink base. No purulent drainage or surrounding erythema.  TTP  Medial LLE: 3-4 cm wound with pink base and area of necrotic tissue. Undermined border. No purulent drainage or surrounding erythema.  Malodorous smell.  TTP  Right hand: Medial distal phalanx with fluctuant swelling. TTP.  Left Hand: Thenar eminence with 0.5 cm wound, with overlying crust. No purulent drainage or surrounding erythema.         Lab results: Basic Metabolic Panel:  Recent Labs  06/14/15 1954  NA 137  K 3.6  CL 103  CO2 25  GLUCOSE 117*  BUN 13  CREATININE 0.75  CALCIUM 8.7*   Liver Function Tests: No results for input(s): AST, ALT, ALKPHOS, BILITOT, PROT, ALBUMIN in the last 72 hours. No results for input(s): LIPASE, AMYLASE in the last 72 hours. No results for input(s): AMMONIA in the last 72 hours. CBC:  Recent Labs  06/14/15 1954  WBC 5.2  NEUTROABS 3.3  HGB 9.9*  HCT 29.9*  MCV 83.8  PLT 386   Cardiac Enzymes: No results for input(s): CKTOTAL, CKMB, CKMBINDEX, TROPONINI in the last 72 hours. BNP: No results for input(s): PROBNP in the last 72 hours. D-Dimer: No results for input(s): DDIMER  in the last 72 hours. CBG: No results for input(s): GLUCAP in the last 72 hours. Hemoglobin A1C: No results for input(s): HGBA1C in the last 72 hours. Fasting Lipid Panel: No results for input(s): CHOL, HDL, LDLCALC, TRIG, CHOLHDL, LDLDIRECT in the last 72 hours. Thyroid Function Tests: No results for input(s): TSH, T4TOTAL, FREET4, T3FREE, THYROIDAB in the last 72 hours. Anemia Panel: No results for input(s): VITAMINB12, FOLATE, FERRITIN, TIBC, IRON, RETICCTPCT in the last 72 hours. Coagulation: No results for input(s): LABPROT, INR in the last 72 hours. Urine Drug Screen: Drugs of Abuse     Component Value Date/Time   LABOPIA NONE DETECTED 06/14/2015 2234   LABOPIA PPS 08/31/2012 1045   COCAINSCRNUR POSITIVE* 06/14/2015 2234   COCAINSCRNUR NEG 08/31/2012 1045   LABBENZ NONE DETECTED 06/14/2015 2234   LABBENZ NEG 08/31/2012 1045   LABBENZ NEGATIVE 06/12/2011 0221   AMPHETMU NONE DETECTED 06/14/2015 2234   AMPHETMU NEG 08/31/2012 1045   AMPHETMU NEGATIVE 06/12/2011 0221   THCU NONE DETECTED 06/14/2015 2234   THCU NEG 08/31/2012 1045   THCU 63.4* 05/20/2012 1405   LABBARB NONE DETECTED 06/14/2015 2234   LABBARB NEG 08/31/2012 1045    Alcohol Level: No results for input(s): ETH in the last 72 hours. Urinalysis:  Recent Labs  06/14/15 2234  COLORURINE YELLOW  LABSPEC 1.017  PHURINE 6.5  GLUCOSEU NEGATIVE  HGBUR TRACE*  BILIRUBINUR NEGATIVE  KETONESUR NEGATIVE  PROTEINUR NEGATIVE  UROBILINOGEN 1.0  NITRITE NEGATIVE  LEUKOCYTESUR NEGATIVE     Imaging results:  Dg Tibia/fibula Left  06/14/2015   CLINICAL DATA:  Left lower extremity pain and infection.  EXAM: LEFT TIBIA AND FIBULA - 2 VIEW  COMPARISON:  June 04, 2015.  FINDINGS: There is no evidence of fracture or other focal bone lesions. Soft tissues are unremarkable.  IMPRESSION: Normal left tibia and fibula.   Electronically Signed   By: Jeneen Rinks  Murlean Caller, M.D.   On: 06/14/2015 20:34   Dg Hand 2 View  Right  06/14/2015   CLINICAL DATA:  54 year old female with abscess at distal tip of the right index finger  EXAM: RIGHT HAND - 2 VIEW  COMPARISON:  Radiograph dated 01/14/2015  FINDINGS: There is no acute fracture or dislocation. There is a focal soft tissue swelling at the distal interphalangeal joint of the second digit. No soft tissue gas or radiopaque foreign object identified. Clinical correlation is recommended.  IMPRESSION: Soft tissue swelling of the distal aspect of the index finger. No radiopaque foreign object or soft tissue gas identified.  No fracture or dislocation.   Electronically Signed   By: Anner Crete M.D.   On: 06/14/2015 23:49     Assessment & Plan by Problem: Active Problems:   Wound infection  Ms. KISMET FACEMIRE is a 55 y.o. 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  LLE Necrotic Wound Infection: Original wound 2/2 levimasole vasculitis.  Followed by wound care.  S/p I&D 9/3 to abscesses on lateral LLE. Medial wound TTP with malodorous smell.  No fevers, purulence, or surrounding erythema.  WBC 5.2 (9/3: 3.8; 8/31: 3.0).  No evidence of osteo on plain film today or by MRI 8/30.   Necrotic tissue on medial wound will likely need debridement.  Consider repeat MRI if ESR/CRP elevated.  Patient started on Bactrim 9/7, and has only taken 3 doses.  S/p one dose Clinda IV in ED.  Will continue Bactrim and add Amoxicillin for strep coverage. - Wound care consult - ESR, CRP - Bactrim 800-160 mg BID - Amoxicillin 500 q8hours - Oxy IR 5 mg q4hours PRN pain  Distal Right 2nd Finger Abscess: Does not appear to originate from nail bed or DIP.  Flunctuant and TTP.   Likely a subcutaneous abscess, though concern for osteo, tenosynovitis, or septic arthritis. Plain film notes swelling, but does not comment on presence/absence of osteo. Low concern for osteo, given short time course (2  days).  Given pain with flexion/extensions, will evaluate for tenosynovitis with [place].  Consider I&D in AM if [place] negative. - [place] right 2nd finger - Consider hand surgery consult for I&D   Folliculitis: Patient has h/o folliculitis in b/l axillae.  Given acyclovir by ED physician 9/3 for recurrent shingles of left axilla.  Achy, itchy pain, recurrences every month, and morphology not consistent with shingles.  Morphology and sx more c/w her underlying folliculitis.  Hold acyclovir, and if concern for possible shingles recurrence, would give Valtrex.   - Hold Acyclovir - Consider VZV, HSV swab, and Valtrex - Mupirocin ointment  HTN: Lisinopril 10 daily Depression: Wellbutrin XL 150 daily  FEN/GI - Regular diet  DVT Ppx: Lovenox  Dispo: Disposition is deferred at this time, awaiting improvement of current medical problems.   The patient does have a current PCP (Zada Finders, MD) and does need an [facility] hospital follow-up appointment after discharge.  The patient does not have transportation limitations that hinder transportation to clinic appointments.  Signed: Iline Oven, MD, PhD 06/15/2015, 12:06 AM

## 2015-06-14 NOTE — Progress Notes (Signed)
Attempted report x1. 

## 2015-06-14 NOTE — ED Provider Notes (Signed)
CSN: [id number]     Arrival date & time 06/14/15  1515 History   First MD Initiated Contact with Patient 06/14/15 1811     Chief Complaint  Patient presents with  . Wound Infection     The history is provided by the patient. No language interpreter was used.   [name] presents for evaluation of wound infection. She reports that she's had increased pain and drainage from a left lower extremity wound for the last week. She was seen in the emergency department for this wound a week ago and had it drained. Since that time she's had worsening odor at the site and increased pain. The pain is described as burning in nature. She denies any fevers, vomiting, diarrhea. Symptoms are severe, constant, worsening. She is taking her antibiotics as prescribed.   Past Medical History  Diagnosis Date  . 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 Followed by Dr. Louanne Skye  . Hypertension   . Cocaine abuse     ongoing with resultant vaculitis.  . Depression   . Normocytic anemia     BL Hgb 9.8-12. Last anemia panel 04/2010 - showing Fe 19, ferritin 101.  Pt on monthly B12 injections  . 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 CAP (community acquired pneumonia) 03/2014 X 2  . Daily headache   . Migraines     "probably 5-6/yr" (04/21/2014)  . Rheumatoid arthritis(714.0)     patient reported  . Inflammatory arthritis    Past Surgical History  Procedure Laterality Date  . Skin biopsy Bilateral 04/2010    shin nodules  . Irrigation and debridement abscess Bilateral 09/26/2013    Procedure: DEBRIDEMENT ULCERS BILATERAL THIGHS;  Surgeon: Gwenyth Ober, MD;  Location: [facility];  Service: General;  Laterality: Bilateral;  . Hernia repair      "stomach"   Family History  Problem Relation Age of Onset  . Breast cancer Mother     Breast cancer  . Alcohol abuse Mother   . Colon cancer Maternal Aunt 65  . Alcohol abuse Father    Social History  Substance Use Topics  . Smoking  status: Current Every Day Smoker -- 0.30 packs/day for 34 years    Types: Cigarettes  . Smokeless tobacco: Never Used     Comment: "Trying very hard".  . Alcohol Use: No   OB History    No data available     Review of Systems  All other systems reviewed and are negative.     Allergies  Acetaminophen  Home Medications   Prior to Admission medications   Medication Sig Start Date End Date Taking? Authorizing Provider  acyclovir (ZOVIRAX) 800 MG tablet Take 1 tablet (800 mg total) by mouth 5 (five) times daily. 06/08/15   Debby Freiberg, MD  aspirin EC 81 MG EC tablet Take 1 tablet (81 mg total) by mouth daily. Patient not taking: Reported on 04/21/2015 04/08/15   Milagros Loll, MD  aspirin EC 81 MG EC tablet Take 1 tablet (81 mg total) by mouth daily. Patient not taking: Reported on 06/08/2015 06/05/15   Loleta Chance, MD  buPROPion (WELLBUTRIN XL) 150 MG 24 hr tablet Take 1 tablet (150 mg total) by mouth daily. 06/05/15   Loleta Chance, MD  lidocaine (XYLOCAINE) 4 % external solution Apply topically 3 (three) times daily as needed for moderate pain. Patient not taking: Reported on 05/23/2015 04/22/15   Milagros Loll, MD  lisinopril (PRINIVIL,ZESTRIL) 10 MG tablet Take 1 tablet (10 mg total) by mouth daily. Patient taking differently: Take 10 mg by mouth daily as needed (for high blood pressure).  03/11/15   Kelby Aline, MD  lisinopril (PRINIVIL,ZESTRIL) 10 MG tablet Take 1 tablet (10 mg total) by mouth daily. Patient not taking: Reported on 06/08/2015 06/05/15   Loleta Chance, MD  mupirocin ointment (BACTROBAN) 2 % Apply topically 3 (three) times daily. Patient not taking: Reported on 06/08/2015 06/05/15   Burgess Estelle, MD  simvastatin (ZOCOR) 20 MG tablet Take 1 tablet (20 mg total) by mouth daily at 6 PM. Patient not taking: Reported on 05/23/2015 04/22/15   Milagros Loll, MD  simvastatin (ZOCOR) 20 MG tablet Take 1 tablet (20 mg total) by mouth daily at 6 PM. Patient not taking: Reported  on 06/08/2015 06/05/15   Loleta Chance, MD  sulfamethoxazole-trimethoprim (BACTRIM DS,SEPTRA DS) 800-160 MG per tablet Take 1 tablet by mouth 2 (two) times daily. 06/08/15   Debby Freiberg, MD   BP 145/80 mmHg  Pulse 98  Temp(Src) 98 F (36.7 C) (Oral)  Resp 16  Ht 5' 1"  (1.549 m)  Wt 99 lb (44.906 kg)  BMI 18.72 kg/m2  SpO2 100% Physical Exam  Constitutional: She is oriented to person, place, and time. She appears well-developed and well-nourished.  Uncomfortable appearing  HENT:  Head: Normocephalic and atraumatic.  Cardiovascular: Normal rate and regular rhythm.   Pulmonary/Chest: Effort normal. No respiratory distress.  Musculoskeletal:  There is a large ulcer on the left distal medial leg, approximately 4-5 cm in diameter with small necrotic region. There is mild surrounding erythema and edema. There is a smaller ulcer inferior to this location and 2 small ulcers on the lateral aspect of the distal left lower extremity. There is a small amount of swelling around these ulcers. 2+ DP pulses bilaterally.  Neurological: She is alert and oriented to person, place, and time.  Skin: Skin is warm and dry.  Psychiatric:  Anxious  Nursing note and vitals reviewed.   ED Course  Procedures (including critical care time) Labs Review Labs Reviewed  URINE RAPID DRUG SCREEN, HOSP PERFORMED - Abnormal; Notable for the following:    Cocaine POSITIVE (*)    All other components within normal limits  URINALYSIS, ROUTINE W REFLEX MICROSCOPIC (NOT AT [facility]) - Abnormal; Notable for the following:    Hgb urine dipstick TRACE (*)    All other components within normal limits  BASIC METABOLIC PANEL - Abnormal; Notable for the following:    Glucose, Bld 117 (*)    Calcium 8.7 (*)    All other components within normal limits  CBC WITH DIFFERENTIAL/PLATELET - Abnormal; Notable for the following:    RBC 3.57 (*)    Hemoglobin 9.9 (*)    HCT 29.9 (*)    All other components within normal limits  URINE  MICROSCOPIC-ADD ON - Abnormal; Notable for the following:    Squamous Epithelial / LPF FEW (*)    Bacteria, UA FEW (*)    All other components within normal limits  WOUND CULTURE    Imaging Review No results found. I have personally reviewed and evaluated these images and lab results as part of my medical decision-making.   EKG Interpretation None      MDM   Final diagnoses:  Leg ulcer, left, with unspecified severity    Patient here for evaluation of leg pain. She has a foul-smelling wound to the left lateral leg. There is  mild local erythema and edema, question infection versus progressive vasculitis. She was treated with a dose of clindamycin. Discussed with internal medicine regarding admission for possible wound consult and debridement.    Quintella Reichert, MD 06/15/15 [phone]

## 2015-06-15 ENCOUNTER — Inpatient Hospital Stay (HOSPITAL_COMMUNITY): Payer: Medicaid Other

## 2015-06-15 DIAGNOSIS — L0291 Cutaneous abscess, unspecified: Secondary | ICD-10-CM | POA: Diagnosis present

## 2015-06-15 DIAGNOSIS — L97909 Non-pressure chronic ulcer of unspecified part of unspecified lower leg with unspecified severity: Secondary | ICD-10-CM | POA: Insufficient documentation

## 2015-06-15 LAB — CBC
HCT: 29.6 % — ABNORMAL LOW (ref 36.0–46.0)
HEMOGLOBIN: 10.2 g/dL — AB (ref 12.0–15.0)
MCH: 29.2 pg (ref 26.0–34.0)
MCHC: 34.5 g/dL (ref 30.0–36.0)
MCV: 84.8 fL (ref 78.0–100.0)
Platelets: 296 10*3/uL (ref 150–400)
RBC: 3.49 MIL/uL — ABNORMAL LOW (ref 3.87–5.11)
RDW: 14 % (ref 11.5–15.5)
WBC: 4.1 10*3/uL (ref 4.0–10.5)

## 2015-06-15 LAB — BASIC METABOLIC PANEL
ANION GAP: 8 (ref 5–15)
BUN: 11 mg/dL (ref 6–20)
CALCIUM: 8.3 mg/dL — AB (ref 8.9–10.3)
CO2: 25 mmol/L (ref 22–32)
Chloride: 103 mmol/L (ref 101–111)
Creatinine, Ser: 0.73 mg/dL (ref 0.44–1.00)
GFR calc Af Amer: >60 mL/min (ref >60)
GLUCOSE: 94 mg/dL (ref 65–99)
Potassium: 3.8 mmol/L (ref 3.5–5.1)
Sodium: 136 mmol/L (ref 135–145)

## 2015-06-15 LAB — C-REACTIVE PROTEIN: CRP: 1 mg/dL — AB (ref <1.0)

## 2015-06-15 LAB — SEDIMENTATION RATE: SED RATE: 71 mm/h — AB (ref 0–22)

## 2015-06-15 MED ORDER — LISINOPRIL 10 MG PO TABS
10.0000 mg | ORAL_TABLET | Freq: Every day | ORAL | Status: DC
  Administered 2015-06-15 – 2015-06-16 (×2): 10 mg via ORAL
  Filled 2015-06-15 (×3): qty 1

## 2015-06-15 MED ORDER — OXYCODONE HCL 5 MG PO TABS
5.0000 mg | ORAL_TABLET | ORAL | Status: DC | PRN
  Administered 2015-06-15 – 2015-06-17 (×13): 5 mg via ORAL
  Filled 2015-06-15 (×14): qty 1

## 2015-06-15 MED ORDER — AMOXICILLIN 500 MG PO CAPS: 500.0000 mg | ORAL_CAPSULE | Freq: Three times a day (TID) | ORAL | Status: DC

## 2015-06-15 MED ORDER — ONDANSETRON HCL 4 MG PO TABS: 4.0000 mg | ORAL_TABLET | Freq: Four times a day (QID) | ORAL | Status: DC | PRN

## 2015-06-15 MED ORDER — ONDANSETRON HCL 4 MG/2ML IJ SOLN: 4.0000 mg | Freq: Four times a day (QID) | INTRAMUSCULAR | Status: DC | PRN

## 2015-06-15 MED ORDER — SULFAMETHOXAZOLE-TRIMETHOPRIM 800-160 MG PO TABS
1.0000 | ORAL_TABLET | Freq: Two times a day (BID) | ORAL | Status: DC
  Administered 2015-06-16 – 2015-06-17 (×3): 1 via ORAL
  Filled 2015-06-15 (×3): qty 1

## 2015-06-15 MED ORDER — BUPROPION HCL ER (XL) 150 MG PO TB24
150.0000 mg | ORAL_TABLET | Freq: Every day | ORAL | Status: DC
  Administered 2015-06-15 – 2015-06-17 (×3): 150 mg via ORAL
  Filled 2015-06-15 (×3): qty 1

## 2015-06-15 MED ORDER — ENOXAPARIN SODIUM 30 MG/0.3ML ~~LOC~~ SOLN
30.0000 mg | SUBCUTANEOUS | Status: DC
  Administered 2015-06-15 – 2015-06-16 (×2): 30 mg via SUBCUTANEOUS
  Filled 2015-06-15 (×2): qty 0.3

## 2015-06-15 MED ORDER — POLYETHYLENE GLYCOL 3350 17 G PO PACK
17.0000 g | PACK | Freq: Every day | ORAL | Status: DC | PRN
  Administered 2015-06-15: 17 g via ORAL
  Filled 2015-06-15 (×2): qty 1

## 2015-06-15 MED ORDER — AMOXICILLIN-POT CLAVULANATE 875-125 MG PO TABS
1.0000 | ORAL_TABLET | Freq: Two times a day (BID) | ORAL | Status: DC
  Administered 2015-06-15 – 2015-06-17 (×5): 1 via ORAL
  Filled 2015-06-15 (×5): qty 1

## 2015-06-15 NOTE — Progress Notes (Signed)
MD at bedside. 

## 2015-06-15 NOTE — Progress Notes (Signed)
Subjective: Patient having lot of pain on her left leg where she has the skin lesion, also having lot of pain her right index finger where there is swelling and not able to move the finger b/c of pain. Admits to using cocaine 3 days ago.   Objective: Vital signs in last 24 hours: Filed Vitals:   06/14/15 2245 06/14/15 2315 06/15/15 0155 06/15/15 0503  BP: 154/88 163/94 145/78 139/77  Pulse: 95 99 81 71  Temp:   99.4 F (37.4 C) 98.9 F (37.2 C)  TempSrc:   Oral Oral  Resp:   15 17  Height:      Weight:      SpO2: 100% 100% 100% 100%   Weight change:   Intake/Output Summary (Last 24 hours) at 06/15/15 1216 Last data filed at 06/15/15 0844  Gross per 24 hour  Intake    360 ml  Output      0 ml  Net    360 ml   Vitals reviewed. General: resting in bed, NAD HEENT: PERRL, EOMI, no scleral icterus. Ears missing superior portion of helix. Cardiac: RRR, no rubs, murmurs or gallops Pulm: clear to auscultation bilaterally, no wheezes, rales, or rhonchi Abd: soft, nontender, nondistended, BS present Ext: we did not take of the dressing today. Please see H&P from Dr. Lovena Le for details. Patient has large 3-4cm wound on medial LLE and smaller three wounds on lateral LLE. RightLE has healed wound, no open wounds now.  Right index finger has swelling on the DIP with tenderness and limited ROM. We did I&D and drained purulent drainage. She felt better with the drainage.  Neuro: alert and oriented X3  Lab Results: Basic Metabolic Panel:  Recent Labs Lab 06/14/15 1954 06/15/15 0226  NA 137 136  K 3.6 3.8  CL 103 103  CO2 25 25  GLUCOSE 117* 94  BUN 13 11  CREATININE 0.75 0.73  CALCIUM 8.7* 8.3*   Liver Function Tests: No results for input(s): AST, ALT, ALKPHOS, BILITOT, PROT, ALBUMIN in the last 168 hours. No results for input(s): LIPASE, AMYLASE in the last 168 hours. No results for input(s): AMMONIA in the last 168 hours. CBC:  Recent Labs Lab 06/14/15 1954  06/15/15 0226  WBC 5.2 4.1  NEUTROABS 3.3  --   HGB 9.9* 10.2*  HCT 29.9* 29.6*  MCV 83.8 84.8  PLT 386 296   Cardiac Enzymes: No results for input(s): CKTOTAL, CKMB, CKMBINDEX, TROPONINI in the last 168 hours. BNP: No results for input(s): PROBNP in the last 168 hours. D-Dimer: No results for input(s): DDIMER in the last 168 hours. CBG: No results for input(s): GLUCAP in the last 168 hours. Hemoglobin A1C: No results for input(s): HGBA1C in the last 168 hours. Fasting Lipid Panel: No results for input(s): CHOL, HDL, LDLCALC, TRIG, CHOLHDL, LDLDIRECT in the last 168 hours. Thyroid Function Tests: No results for input(s): TSH, T4TOTAL, FREET4, T3FREE, THYROIDAB in the last 168 hours. Coagulation: No results for input(s): LABPROT, INR in the last 168 hours. Anemia Panel: No results for input(s): VITAMINB12, FOLATE, FERRITIN, TIBC, IRON, RETICCTPCT in the last 168 hours. Urine Drug Screen: Drugs of Abuse     Component Value Date/Time   LABOPIA NONE DETECTED 06/14/2015 2234   LABOPIA PPS 08/31/2012 1045   COCAINSCRNUR POSITIVE* 06/14/2015 2234   COCAINSCRNUR NEG 08/31/2012 1045   LABBENZ NONE DETECTED 06/14/2015 2234   LABBENZ NEG 08/31/2012 1045   LABBENZ NEGATIVE 06/12/2011 0221   AMPHETMU NONE DETECTED 06/14/2015 2234  AMPHETMU NEG 08/31/2012 1045   AMPHETMU NEGATIVE 06/12/2011 0221   THCU NONE DETECTED 06/14/2015 2234   THCU NEG 08/31/2012 1045   THCU 63.4* 05/20/2012 1405   LABBARB NONE DETECTED 06/14/2015 2234   LABBARB NEG 08/31/2012 1045    Alcohol Level: No results for input(s): ETH in the last 168 hours. Urinalysis:  Recent Labs Lab 06/14/15 2234  COLORURINE YELLOW  LABSPEC 1.017  PHURINE 6.5  GLUCOSEU NEGATIVE  HGBUR TRACE*  BILIRUBINUR NEGATIVE  KETONESUR NEGATIVE  PROTEINUR NEGATIVE  UROBILINOGEN 1.0  NITRITE NEGATIVE  LEUKOCYTESUR NEGATIVE   Misc. Labs:  Micro Results: Recent Results (from the past 240 hour(s))  Wound culture      Status: None   Collection Time: 06/08/15  3:28 PM  Result Value Ref Range Status   Specimen Description WOUND LEFT LEG  Final   Special Requests NONE  Final   Gram Stain   Final    NO WBC SEEN NO SQUAMOUS EPITHELIAL CELLS SEEN NO ORGANISMS SEEN Performed at [facility]    Culture   Final    MULTIPLE ORGANISMS PRESENT, NONE PREDOMINANT Note: NO STAPHYLOCOCCUS AUREUS ISOLATED NO GROUP A STREP (S.PYOGENES) ISOLATED Performed at [facility]    Report Status 06/11/2015 FINAL  Final   Studies/Results: Dg Tibia/fibula Left  06/14/2015   CLINICAL DATA:  Left lower extremity pain and infection.  EXAM: LEFT TIBIA AND FIBULA - 2 VIEW  COMPARISON:  June 04, 2015.  FINDINGS: There is no evidence of fracture or other focal bone lesions. Soft tissues are unremarkable.  IMPRESSION: Normal left tibia and fibula.   Electronically Signed   By: Marijo Conception, M.D.   On: 06/14/2015 20:34   Dg Hand 2 View Right  06/14/2015   CLINICAL DATA:  54 year old female with abscess at distal tip of the right index finger  EXAM: RIGHT HAND - 2 VIEW  COMPARISON:  Radiograph dated 01/14/2015  FINDINGS: There is no acute fracture or dislocation. There is a focal soft tissue swelling at the distal interphalangeal joint of the second digit. No soft tissue gas or radiopaque foreign object identified. Clinical correlation is recommended.  IMPRESSION: Soft tissue swelling of the distal aspect of the index finger. No radiopaque foreign object or soft tissue gas identified.  No fracture or dislocation.   Electronically Signed   By: Anner Crete M.D.   On: 06/14/2015 23:49   [place] Extrem Up Right Ltd  06/15/2015   CLINICAL DATA:  Palpable area on the second metatarsal of the right hand for a week. To rule out abscess.  EXAM: ULTRASOUND right UPPER EXTREMITY LIMITED  TECHNIQUE: Ultrasound examination of the upper extremity soft tissues was performed in the area of clinical concern.  COMPARISON:  Right hand  radiograph 06/14/2015  FINDINGS: Ultrasound images obtained of the area of palpable swelling in the right second finger. Soft tissue edema is demonstrated. No discrete fluid collection is identified. No findings to suggest abscess. No abnormal increased flow on color flow Doppler imaging.  IMPRESSION: No soft tissue abscess identified.   Electronically Signed   By: Lucienne Capers M.D.   On: 06/15/2015 01:18   Medications: I have reviewed the patient's current medications. Scheduled Meds: . [START ON 06/16/2015] amoxicillin  500 mg Oral 3 times per day  . buPROPion  150 mg Oral Daily  . enoxaparin (LOVENOX) injection  30 mg Subcutaneous Q24H  . lisinopril  10 mg Oral Daily  . [START ON 06/16/2015] sulfamethoxazole-trimethoprim  1 tablet  Oral Q12H   Continuous Infusions:  PRN Meds:.ondansetron **OR** ondansetron (ZOFRAN) IV, oxyCODONE, polyethylene glycol Assessment/Plan: Active Problems:   Essential hypertension   Cocaine-induced vascular disorder   Protein-calorie malnutrition, severe   Wound infection   Abscess  54 yo female with hx of Cocaine abuse, with Levamisole induced vasculitis, chronic ulcers, here with wound infection of LLE, right 2nd finger, and left thenar aspect.  LLE Necrotic Wound Infection - likely 2/2 to Levimasole vasculitis. S/p I&D abscess on lateral LLE, has medial wound now with tenderness and malodorous smell. No systemic signs of infection currently. wBC is up 5.2 (up from 3.8 on 9/3). No evidence of osteo on xray on admission, or MRI on 8/30.  - wound care consulted, f/up their recs - treat with bactrim + augmentin - oxy IR for pain  Distal 2nd finger abscess - s/p drainage today bedside. - monitor for improvement  Folliculitis - no open open or drainage - continue mupirocin ointment.  HTN - cont lisinopril 15m daily  Depression - cont wellbutrin  Cocaine abuse - recommended stopping cocaine. Last use 3 days PTA.   Dispo: Disposition is deferred at  this time, awaiting improvement of current medical problems.  Anticipated discharge in approximately 1-2 day(s).   The patient does have a current PCP (VZada Finders MD) and [facility].  The patient does have transportation limitations that hinder transportation to clinic appointments.  .Services Needed at time of discharge: Y = Yes, Blank = No PT:   OT:   RN:   Equipment:   Other:     LOS: 1 day   TDellia Nims MD 06/15/2015, 12:16 PM

## 2015-06-15 NOTE — ED Notes (Signed)
Patient taken to U/S at this time.

## 2015-06-16 MED ORDER — SILVER SULFADIAZINE 1 % EX CREA
TOPICAL_CREAM | Freq: Two times a day (BID) | CUTANEOUS | Status: DC
  Administered 2015-06-16 – 2015-06-17 (×2): via TOPICAL
  Filled 2015-06-16 (×2): qty 85

## 2015-06-16 MED ORDER — SILVER SULFADIAZINE 1 % EX CREA
TOPICAL_CREAM | Freq: Every day | CUTANEOUS | Status: DC
  Filled 2015-06-16: qty 85

## 2015-06-16 NOTE — Consult Note (Signed)
WOC wound consult note Reason for Consult: Patient is well known to our department from previous admissions related to levamisole induced vasculitis.  Asked to see today for orders pertaining to care of chronic left medial LE wound. Last seen by my partner D. Engles on 06/04/15. Wounds on ears, elbows and fingers have healed and no not require wound care. Patient performs self care with bacitracin ointment and gauzeat home. Wound type: vasculitis Pressure Ulcer POA: No Measurement:4cm x 5cm x 0.2cm Wound bed: red, moist, non-healing, no necrotic tissue Drainage (amount, consistency, odor) moderate amount of red (serosanguinous) exudate with odor consistent with old blood. No purulence. Periwound:Dry, intact with evidence of numerous areas of previous healing (scars and hyperpigmentation) Dressing procedure/placement/frequency: I will today provide the patient with an antimicrobial (silver sulfadiazine) as the wounds are non-granulating and this may be indicative of excess bioburden. Patient exhibits no other signs of wound infection (purulence, erythema, warmth or induration). Patient is in agreement with POC and is pleased that she will be taking the topical antimicrobial home as opposed to having to pick it up. [place] nursing team will not follow, but will remain available to this patient, the nursing and medical teams.  Please re-consult if needed. Thanks, Cristina Flakes, MSN, RN, [place], [place], [place] [phone])

## 2015-06-16 NOTE — Progress Notes (Signed)
Subjective: Patient still with a lot of pain on medial left lower extremity where her wound is.  Received pain medicine about 10 minutes prior to my seeing her so she is hopeful that will help as this has done a decent job controlling her pain.  Denies any pain with right index finger or left thenar eminence.  States wound care has not come by to see her yet. No fever, chills, chest pain, shortness of breath. No other complaints.  Objective: Vital signs in last 24 hours: Filed Vitals:   06/15/15 0155 06/15/15 0503 06/15/15 2151 06/16/15 0459  BP: 145/78 139/77 121/66 135/76  Pulse: 81 71 95 70  Temp: 99.4 F (37.4 C) 98.9 F (37.2 C) 99.3 F (37.4 C) 98.5 F (36.9 C)  TempSrc: Oral Oral Oral Oral  Resp: 15 17 16 16   Height:      Weight:      SpO2: 100% 100% 99% 100%   Weight change:   Intake/Output Summary (Last 24 hours) at 06/16/15 0853 Last data filed at 06/15/15 1700  Gross per 24 hour  Intake    480 ml  Output      0 ml  Net    480 ml   Vitals reviewed. General: resting in bed, watching TV,  tearful HEENT: EOMI, no scleral icterus. Ears missing superior portion of helix. Cardiac: RRR, no rubs, murmurs or gallops Pulm: clear to auscultation bilaterally, no wheezes, rales, or rhonchi Abd: soft, nontender, nondistended, BS present Ext: Patient has large 3-4cm wound on medial LLE and smaller three wounds on lateral LLE.  She exhibits tenderness to palpation of the surrounding area with no purulent drainage or erythema of the surrounding area.    Right index finger with bandage at the distal phalynx. Left thenar eminence with bandage. Neuro: alert and oriented X3  Lab Results: Basic Metabolic Panel:  Recent Labs Lab 06/14/15 1954 06/15/15 0226  NA 137 136  K 3.6 3.8  CL 103 103  CO2 25 25  GLUCOSE 117* 94  BUN 13 11  CREATININE 0.75 0.73  CALCIUM 8.7* 8.3*   CBC:  Recent Labs Lab 06/14/15 1954 06/15/15 0226  WBC 5.2 4.1  NEUTROABS 3.3  --   HGB  9.9* 10.2*  HCT 29.9* 29.6*  MCV 83.8 84.8  PLT 386 296   Urine Drug Screen: Drugs of Abuse     Component Value Date/Time   LABOPIA NONE DETECTED 06/14/2015 2234   LABOPIA PPS 08/31/2012 1045   COCAINSCRNUR POSITIVE* 06/14/2015 2234   COCAINSCRNUR NEG 08/31/2012 1045   LABBENZ NONE DETECTED 06/14/2015 2234   LABBENZ NEG 08/31/2012 1045   LABBENZ NEGATIVE 06/12/2011 0221   AMPHETMU NONE DETECTED 06/14/2015 2234   AMPHETMU NEG 08/31/2012 1045   AMPHETMU NEGATIVE 06/12/2011 0221   THCU NONE DETECTED 06/14/2015 2234   THCU NEG 08/31/2012 1045   THCU 63.4* 05/20/2012 1405   LABBARB NONE DETECTED 06/14/2015 2234   LABBARB NEG 08/31/2012 1045    Urinalysis:  Recent Labs Lab 06/14/15 2234  COLORURINE YELLOW  LABSPEC 1.017  PHURINE 6.5  GLUCOSEU NEGATIVE  HGBUR TRACE*  BILIRUBINUR NEGATIVE  KETONESUR NEGATIVE  PROTEINUR NEGATIVE  UROBILINOGEN 1.0  NITRITE NEGATIVE  LEUKOCYTESUR NEGATIVE   Misc. Labs:  Micro Results: Recent Results (from the past 240 hour(s))  Wound culture     Status: None   Collection Time: 06/08/15  3:28 PM  Result Value Ref Range Status   Specimen Description WOUND LEFT LEG  Final   Special  Requests NONE  Final   Gram Stain   Final    NO WBC SEEN NO SQUAMOUS EPITHELIAL CELLS SEEN NO ORGANISMS SEEN Performed at [facility]    Culture   Final    MULTIPLE ORGANISMS PRESENT, NONE PREDOMINANT Note: NO STAPHYLOCOCCUS AUREUS ISOLATED NO GROUP A STREP (S.PYOGENES) ISOLATED Performed at [facility]    Report Status 06/11/2015 FINAL  Final  Wound culture     Status: None (Preliminary result)   Collection Time: 06/15/15  8:44 AM  Result Value Ref Range Status   Specimen Description WOUND LEFT LEG  Final   Special Requests NONE  Final   Gram Stain PENDING  Incomplete   Culture   Final    Culture reincubated for better growth Performed at [facility]    Report Status PENDING  Incomplete   Studies/Results: Dg  Tibia/fibula Left  06/14/2015   CLINICAL DATA:  Left lower extremity pain and infection.  EXAM: LEFT TIBIA AND FIBULA - 2 VIEW  COMPARISON:  June 04, 2015.  FINDINGS: There is no evidence of fracture or other focal bone lesions. Soft tissues are unremarkable.  IMPRESSION: Normal left tibia and fibula.   Electronically Signed   By: Marijo Conception, M.D.   On: 06/14/2015 20:34   Dg Hand 2 View Right  06/14/2015   CLINICAL DATA:  54 year old female with abscess at distal tip of the right index finger  EXAM: RIGHT HAND - 2 VIEW  COMPARISON:  Radiograph dated 01/14/2015  FINDINGS: There is no acute fracture or dislocation. There is a focal soft tissue swelling at the distal interphalangeal joint of the second digit. No soft tissue gas or radiopaque foreign object identified. Clinical correlation is recommended.  IMPRESSION: Soft tissue swelling of the distal aspect of the index finger. No radiopaque foreign object or soft tissue gas identified.  No fracture or dislocation.   Electronically Signed   By: Anner Crete M.D.   On: 06/14/2015 23:49   [place] Extrem Up Right Ltd  06/15/2015   CLINICAL DATA:  Palpable area on the second metatarsal of the right hand for a week. To rule out abscess.  EXAM: ULTRASOUND right UPPER EXTREMITY LIMITED  TECHNIQUE: Ultrasound examination of the upper extremity soft tissues was performed in the area of clinical concern.  COMPARISON:  Right hand radiograph 06/14/2015  FINDINGS: Ultrasound images obtained of the area of palpable swelling in the right second finger. Soft tissue edema is demonstrated. No discrete fluid collection is identified. No findings to suggest abscess. No abnormal increased flow on color flow Doppler imaging.  IMPRESSION: No soft tissue abscess identified.   Electronically Signed   By: Lucienne Capers M.D.   On: 06/15/2015 01:18   Medications: I have reviewed the patient's current medications. Scheduled Meds: . amoxicillin-clavulanate  1 tablet Oral Q12H  .  buPROPion  150 mg Oral Daily  . enoxaparin (LOVENOX) injection  30 mg Subcutaneous Q24H  . lisinopril  10 mg Oral Daily  . sulfamethoxazole-trimethoprim  1 tablet Oral Q12H   Continuous Infusions:  PRN Meds:.ondansetron **OR** ondansetron (ZOFRAN) IV, oxyCODONE, polyethylene glycol Assessment/Plan: Active Problems:   Essential hypertension   Cocaine-induced vascular disorder   Protein-calorie malnutrition, severe   Wound infection   Abscess   Leg ulcer  55 yo female with hx of Cocaine abuse, with Levamisole induced vasculitis, chronic ulcers, here with wound infection of LLE, right 2nd finger, and left thenar aspect.  Necrotic wound infection left lower leg: 2/2  Levimasole vasulitis. Medial wound with tenderness, no surrounding erythema or drainage.  No systemic signs of infection.  X-ray on admission with no evidence of osteo. -WBC trended down from 5.2-->4.1 -CRP 1.0, ESR 71 (from 9/10) -wound care consulted, will follow up their recommendations -TMP-SMX 800-126m PO q12h -amoxicillin-clavulanate 875-1253mPO q12h -oxycodone 52m52mO q4h prn  Distal 2nd finger abscess: s/p drainage yesterday at bedside -improving and without pain today -continue to monitor  Folliculitis: of bilateral axilla.  -improving -hold mupirocin ointment.  HTN -continue lisinopril 78m32m daily  Depression -continue bupropion 150mg31mdaily  Cocaine abuse -recommended stopping cocaine. Last use 3 days PTA.   Dispo: Disposition is deferred at this time, awaiting improvement of current medical problems.  Anticipated discharge in approximately 1-2 day(s).   The patient does have a current PCP (VishaZada Finders and [facility].  The patient does have transportation limitations that hinder transportation to clinic appointments.    LOS: 2 days   AndreJule Ser9/08/2015, 8:53 AM

## 2015-06-17 LAB — WOUND CULTURE

## 2015-06-17 LAB — CBC
HEMATOCRIT: 29.2 % — AB (ref 36.0–46.0)
HEMOGLOBIN: 9.6 g/dL — AB (ref 12.0–15.0)
MCH: 27.6 pg (ref 26.0–34.0)
MCHC: 32.9 g/dL (ref 30.0–36.0)
MCV: 83.9 fL (ref 78.0–100.0)
Platelets: 280 10*3/uL (ref 150–400)
RBC: 3.48 MIL/uL — AB (ref 3.87–5.11)
RDW: 14 % (ref 11.5–15.5)
WBC: 3.9 10*3/uL — ABNORMAL LOW (ref 4.0–10.5)

## 2015-06-17 MED ORDER — SILVER SULFADIAZINE 1 % EX CREA
TOPICAL_CREAM | Freq: Two times a day (BID) | CUTANEOUS | Status: DC
Start: 2015-06-17 — End: 2015-10-13

## 2015-06-17 MED ORDER — AMOXICILLIN-POT CLAVULANATE 875-125 MG PO TABS: 1.0000 | ORAL_TABLET | Freq: Two times a day (BID) | ORAL | Status: DC

## 2015-06-17 MED ORDER — IBUPROFEN 200 MG PO TABS: 600.0000 mg | ORAL_TABLET | ORAL | Status: DC | PRN

## 2015-06-17 MED ORDER — SULFAMETHOXAZOLE-TRIMETHOPRIM 800-160 MG PO TABS: 1.0000 | ORAL_TABLET | Freq: Two times a day (BID) | ORAL | Status: DC

## 2015-06-17 MED ORDER — IBUPROFEN 600 MG PO TABS: 600.0000 mg | ORAL_TABLET | Freq: Four times a day (QID) | ORAL | Status: DC | PRN

## 2015-06-17 NOTE — Progress Notes (Signed)
Utilization review completed.  

## 2015-06-17 NOTE — Progress Notes (Signed)
Subjective: Patient pain doing better today.  She was seen by wound care yesterday who provided her with silver sulfadiazine and instructed how to take care of wounds at discharge.  Patient with no new complaints  Objective: Vital signs in last 24 hours: Filed Vitals:   06/16/15 1400 06/16/15 2143 06/17/15 0629 06/17/15 1328  BP: 126/63 126/65 89/59 137/46  Pulse: 76 75 72 86  Temp: 98.9 F (37.2 C) 98.4 F (36.9 C) 98.2 F (36.8 C) 97.8 F (36.6 C)  TempSrc:  Oral Oral Oral  Resp: _0 Height:      Weight:      SpO2: 100% 100% 100% 100%   Weight change:   Intake/Output Summary (Last 24 hours) at 06/17/15 1454 Last data filed at 06/17/15 1300  Gross per 24 hour  Intake    840 ml  Output    400 ml  Net    440 ml   General: resting in bed, no distress HEENT: EOMI, no scleral icterus. Ears missing superior portion of helix. Cardiac: RRR, no rubs, murmurs or gallops Pulm: clear to auscultation bilaterally, no wheezes, rales, or rhonchi Abd: soft, nontender, nondistended, BS present Ext: Patient has large 3-4cm wound on medial LLE with granulation tissue  She exhibits tenderness to palpation of the surrounding area with no purulent drainage or erythema of the surrounding area. Right index finger with bandage at the distal phalynx. Left thenar eminence with bandage. Neuro: alert and oriented X3  Lab Results: Basic Metabolic Panel:  Recent Labs Lab 06/14/15 1954 06/15/15 0226  NA 137 136  K 3.6 3.8  CL 103 103  CO2 25 25  GLUCOSE 117* 94  BUN 13 11  CREATININE 0.75 0.73  CALCIUM 8.7* 8.3*   CBC:  Recent Labs Lab 06/14/15 1954 06/15/15 0226 06/17/15 0436  WBC 5.2 4.1 3.9*  NEUTROABS 3.3  --   --   HGB 9.9* 10.2* 9.6*  HCT 29.9* 29.6* 29.2*  MCV 83.8 84.8 83.9  PLT 386 296 280   Urine Drug Screen: Drugs of Abuse     Component Value Date/Time   LABOPIA NONE DETECTED 06/14/2015 2234   LABOPIA PPS 08/31/2012 1045   COCAINSCRNUR POSITIVE*  06/14/2015 2234   COCAINSCRNUR NEG 08/31/2012 1045   LABBENZ NONE DETECTED 06/14/2015 2234   LABBENZ NEG 08/31/2012 1045   LABBENZ NEGATIVE 06/12/2011 0221   AMPHETMU NONE DETECTED 06/14/2015 2234   AMPHETMU NEG 08/31/2012 1045   AMPHETMU NEGATIVE 06/12/2011 0221   THCU NONE DETECTED 06/14/2015 2234   THCU NEG 08/31/2012 1045   THCU 63.4* 05/20/2012 1405   LABBARB NONE DETECTED 06/14/2015 2234   LABBARB NEG 08/31/2012 1045    Urinalysis:  Recent Labs Lab 06/14/15 2234  COLORURINE YELLOW  LABSPEC 1.017  PHURINE 6.5  GLUCOSEU NEGATIVE  HGBUR TRACE*  BILIRUBINUR NEGATIVE  KETONESUR NEGATIVE  PROTEINUR NEGATIVE  UROBILINOGEN 1.0  NITRITE NEGATIVE  LEUKOCYTESUR NEGATIVE   Misc. Labs:  Micro Results: Recent Results (from the past 240 hour(s))  Wound culture     Status: None   Collection Time: 06/08/15  3:28 PM  Result Value Ref Range Status   Specimen Description WOUND LEFT LEG  Final   Special Requests NONE  Final   Gram Stain   Final    NO WBC SEEN NO SQUAMOUS EPITHELIAL CELLS SEEN NO ORGANISMS SEEN Performed at [facility]    Culture   Final    MULTIPLE ORGANISMS PRESENT, NONE PREDOMINANT Note: NO STAPHYLOCOCCUS  AUREUS ISOLATED NO GROUP A STREP (S.PYOGENES) ISOLATED Performed at [facility]    Report Status 06/11/2015 FINAL  Final  Wound culture     Status: None   Collection Time: 06/15/15  8:44 AM  Result Value Ref Range Status   Specimen Description WOUND LEFT LEG  Final   Special Requests NONE  Final   Gram Stain   Final    MODERATE WBC PRESENT,BOTH PMN AND MONONUCLEAR NO SQUAMOUS EPITHELIAL CELLS SEEN RARE GRAM POSITIVE COCCI IN PAIRS Performed at [facility]    Culture   Final    MULTIPLE ORGANISMS PRESENT, NONE PREDOMINANT Note: NO STAPHYLOCOCCUS AUREUS ISOLATED NO GROUP A STREP (S.PYOGENES) ISOLATED Performed at [facility]    Report Status 06/17/2015 FINAL  Final   Studies/Results: No results  found. Medications: I have reviewed the patient's current medications. Scheduled Meds: . amoxicillin-clavulanate  1 tablet Oral Q12H  . buPROPion  150 mg Oral Daily  . enoxaparin (LOVENOX) injection  30 mg Subcutaneous Q24H  . lisinopril  10 mg Oral Daily  . silver sulfADIAZINE   Topical BID  . sulfamethoxazole-trimethoprim  1 tablet Oral Q12H   Continuous Infusions:  PRN Meds:.ibuprofen, ondansetron **OR** ondansetron (ZOFRAN) IV, polyethylene glycol Assessment/Plan: Active Problems:   Essential hypertension   Cocaine-induced vascular disorder   Protein-calorie malnutrition, severe   Wound infection   Abscess   Leg ulcer  54 yo female with hx of Cocaine abuse, with Levamisole induced vasculitis, chronic ulcers, here with wound infection of LLE, right 2nd finger, and left thenar aspect.  Necrotic wound infection left lower leg: 2/2 Levimasole vasulitis. Medial wound with tenderness, no surrounding erythema or drainage.  No systemic signs of infection.  X-ray on admission with no evidence of osteo. -WBC trended down from 5.2-->4.1-->3.9 -CRP 1.0, ESR 71 (from 9/10) -wound care consulted, will follow up their recommendations -TMP-SMX 800-1484m PO q12h -amoxicillin-clavulanate 875-1272mPO q12h -discontinue oxycodone 84m22mO q4h prn -start ibuprofen 600m53mh prn  Distal 2nd finger abscess: s/p drainage yesterday at bedside -improving and without pain today -continue to monitor  Folliculitis: of bilateral axilla.  -improving -hold mupirocin ointment.  HTN -continue lisinopril 10mg31mdaily  Depression -continue bupropion 150mg 54maily  Cocaine abuse -counseled patient on importnace of stopping cocaine and offered support. Last use 3 days PTA.   Dis[facility]  Will continue Augmentin and Bactrim for 5 more days and start ibuprofen for pain control.  The patient does have a current PCP ([facility]ishalZada [facility]ersand [facility]  The patient does have transportation limitations that hinder transportation to clinic appointments.    LOS: 3 days   AndrewJule Ser/09/2015, 2:54 PM

## 2015-06-17 NOTE — Discharge Summary (Signed)
Name: [name] MRN: [id number] DOB: 1960/11/02 54 y.o. PCP: Zada Finders, MD  Date of Admission: 06/14/2015  6:07 PM Date of Discharge: 06/19/2015 Attending Physician: No att. providers found  Discharge Diagnosis: 1. 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     amoxicillin-clavulanate 875-125 MG per tablet  Commonly known as:  AUGMENTIN  Take 1 tablet by mouth 2 (two) times daily.     aspirin 81 MG EC tablet  Take 1 tablet (81 mg total) by mouth daily.     buPROPion 150 MG 24 hr tablet  Commonly known as:  WELLBUTRIN XL  Take 150 mg by mouth daily.     ibuprofen 600 MG tablet  Commonly known as:  ADVIL,MOTRIN  Take 1 tablet (600 mg total) by mouth every 6 (six) hours as needed for fever, headache, mild pain, moderate pain or cramping.     lisinopril 10 MG tablet  Commonly known as:  PRINIVIL,ZESTRIL  Take 1 tablet (10 mg total) by mouth daily.     mupirocin ointment 2 %  Commonly known as:  BACTROBAN  APPLY TO AFFECTED AREA 3 TIMES DAILY     silver sulfADIAZINE 1 % cream  Commonly known as:  SILVADENE  Apply topically 2 (two) times daily.     simvastatin 20 MG tablet  Commonly known as:  ZOCOR  Take 1 tablet (20 mg total) by mouth daily at 6 PM.     sulfamethoxazole-trimethoprim 800-160 MG per tablet  Commonly known as:  BACTRIM DS,SEPTRA DS  Take 1 tablet by mouth 2 (two) times daily.        Disposition and follow-up:   [name] was discharged from [facility] in Stable condition.  At the hospital follow up visit please address:  1.  Patients ability to care for her wound  with the silver sulfadiazine she was given and whether she has follow up with the wound center.  Please make sure she is taking her antibiotics as prescribed.  Please address her distal right finger abscess healing. Please address the importance of her abstinence from crack-cocaine.  2.  Labs / imaging needed at time of follow-up: none  3.  Pending labs/ test needing follow-up: none  Follow-up Appointments: Follow-up Information    Follow up with Zada Finders, MD On 06/19/2015.   Specialty:  Internal Medicine   Why:  @ 1:15pm   Contact information:   [place]  [id number] [phone]       Schedule an appointment as soon as possible for a visit with [place]             .   Contact information:   [address] [phone]      Discharge Instructions:   Consultations:    Procedures Performed:  Dg Tibia/fibula Left  06/14/2015   CLINICAL DATA:  Left lower extremity pain and infection.  EXAM: LEFT TIBIA AND FIBULA - 2 VIEW  COMPARISON:  June 04, 2015.  FINDINGS: There is  no evidence of fracture or other focal bone lesions. Soft tissues are unremarkable.  IMPRESSION: Normal left tibia and fibula.   Electronically Signed   By: Marijo Conception, M.D.   On: 06/14/2015 20:34   Dg Tibia/fibula Left  06/04/2015   CLINICAL DATA:  Left lower extremity wound.  EXAM: LEFT TIBIA AND FIBULA - 2 VIEW  COMPARISON:  03/09/2015  FINDINGS: High-density material around skin thickening and ulceration on the lower leg could reflect calcification or superficial debris/ointment. No evidence of osseous infection or soft tissue gas. No acute fracture or malalignment.  IMPRESSION: Lower leg ulcers with debris/ointment or calcification. No soft tissue gas or osseous abnormality.   Electronically Signed   By: Monte Fantasia M.D.   On: 06/04/2015 14:22   Dg Hand 2 View Right  06/14/2015   CLINICAL DATA:   55 year old female with abscess at distal tip of the right index finger  EXAM: RIGHT HAND - 2 VIEW  COMPARISON:  Radiograph dated 01/14/2015  FINDINGS: There is no acute fracture or dislocation. There is a focal soft tissue swelling at the distal interphalangeal joint of the second digit. No soft tissue gas or radiopaque foreign object identified. Clinical correlation is recommended.  IMPRESSION: Soft tissue swelling of the distal aspect of the index finger. No radiopaque foreign object or soft tissue gas identified.  No fracture or dislocation.   Electronically Signed   By: Anner Crete M.D.   On: 06/14/2015 23:49   Mr Tibia Fibula Left Wo Contrast  06/05/2015   CLINICAL DATA:  Increasing left lower leg pain, difficulty walking and nonhealing wound. History of drug abuse and vasculitis.  EXAM: MRI OF LOWER LEFT EXTREMITY WITHOUT CONTRAST  TECHNIQUE: Multiplanar, multisequence MR imaging of the left lower leg was performed. No intravenous contrast was administered.  COMPARISON:  Radiographs 06/04/2015. MRI of the lower legs 04/21/2010 and of the left foot 03/09/2015.  FINDINGS: Examination was performed using the body coil. Both lower legs are included on the axial and coronal images.  Capsules were placed around the nonhealing wound posteromedially in the distal left lower leg. There is overlying skin irregularity with dermal thickening and subcutaneous edema. The subcutaneous edema extends laterally. No focal fluid collection demonstrated on noncontrast imaging. No evidence of foreign body, tenosynovitis or muscular T2 hyperintensity.  The left tibia and fibula appear normal.  No significant abnormalities identified within the right lower leg.  IMPRESSION: 1. Nonhealing wound in the posteromedial aspect of the distal left lower leg is associated with surrounding subcutaneous edema most consistent with cellulitis. 2. No drainable abscess, deep soft tissue infection or evidence of osteomyelitis.    Electronically Signed   By: Richardean Sale M.D.   On: 06/05/2015 08:04   [place] Extrem Up Right Ltd  06/15/2015   CLINICAL DATA:  Palpable area on the second metatarsal of the right hand for a week. To rule out abscess.  EXAM: ULTRASOUND right UPPER EXTREMITY LIMITED  TECHNIQUE: Ultrasound examination of the upper extremity soft tissues was performed in the area of clinical concern.  COMPARISON:  Right hand radiograph 06/14/2015  FINDINGS: Ultrasound images obtained of the area of palpable swelling in the right second finger. Soft tissue edema is demonstrated. No discrete fluid collection is identified. No findings to suggest abscess. No abnormal increased flow on color flow Doppler imaging.  IMPRESSION: No soft tissue abscess identified.   Electronically Signed   By: Lucienne Capers M.D.   On: 06/15/2015 01:18    2D Echo:  none  Cardiac Cath: none  Admission HPI: [name] is a 54 y.o. 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 Patient was admitted to [facility] on 8/30. MRI was negative for osteomyelitis and patient was discharged without antibiotics. She has been followed by wound care as an outpatient. Last week, wound care sent patient to [facility] ED for evaluation of abscesses of LLE wounds. Patient had abscesses I&D'd, and was sent home with Bactrim (9/3). Patient did not fill prescription until 9/7, and has only taken 3 doses. Yesterday (9/8), the wound started to smell bad, and patient took a bleach bath before applying a new bandage with Silvadene. This morning, patient reports pain and smell had become unbearable, and she presented to ED. In addition to her LLE, her right 2nd finger is swollen at the nail bed. This swelling first developed 2 days ago. She endorses pain with flexion and extension of the finger. She has an healing wound to  the left thenar eminence.  Patient also reports folliculitis flare draining serosanguinous fluid in her axillae. Sometimes she will try to pinch the follicles to get them to pop. She also reports a shingles flare in her left axilla. She states she gets these flares every month, when her axilla becomes red, odorous, and the wounds drain yellow pus. She describes the pain as achy and itchy. She denies burning or sharp pains.   She endorses occasional chills and constipation. She denies fever, CP, SOB, abdominal pain, or trouble urinating.   Her last reported cocaine use was 2-3 days ago. She smokes 2-3 cigarettes/day. She denies alcohol use.  Hospital Course by problem list: Active Problems:   Essential hypertension   Cocaine-induced vascular disorder   Protein-calorie malnutrition, severe   Wound infection   Abscess   Leg ulcer   1. Necrotic wound infection left lower leg: 2/2 Levimasole vasulitis. Medial wound with tenderness, no surrounding erythema or drainage. No systemic signs of infection. X-ray on admission with no evidence of osteo.  While here we monitored her WBC and this trended down daily from 5.2 at admission to 3.9 at discharge.  Wound care was consulted and they provided patient with instruction on caring for her wound as well as silver sulfdiazene.  She was started on Augmentin and Bactrim which is to be continued at discharge.  Her pain was controlled with Oxycodone 64m every 4 hours as needed.  This was discontiniued on day of discharge and she was given ibuprofen for pain control.  2. Distal 2nd finger abscess: s/p drainage at bedside while in the hospital.  This improved throughout her hospitalization and was without complaint.  3. Folliculitis: of bilateral axilla. Her mupirocin ointment was held and then restarted at discharge.  4. HTN: we continued lisinopril 131mPO daily  5. Depression: we continued bupropion 15077mO daily  6. Cocaine abuse: patient was  counseled on importnace of stopping cocaine and offered support.  Unfortunately she still uses despite its known harm to her in the setting of her Levimasole vasculitis.  Her ast use had been 3 days PTA.  Discharge Vitals:   BP 137/46 mmHg  Pulse 86  Temp(Src) 97.8 F (36.6 C) (Oral)  Resp 18  Ht 5' 1"  (1.549 m)  Wt 99 lb (44.906 kg)  BMI 18.72 kg/m2  SpO2 100%  Discharge Labs:  No results found for this  or any previous visit (from the past 24 hour(s)).  Signed: Jule Ser, DO 06/19/2015, 5:00 AM    Services Ordered on Discharge: none Equipment Ordered on Discharge: none

## 2015-06-17 NOTE — Discharge Instructions (Signed)
Please follow up with the [facility] at [facility].  Please follow up with your primary care physician at the [facility].  An appointment has been made for you.  Please continue to change your wound dressings as instructed.  Please take your antibiotics as prescribed and ibuprofen as needed for pain.

## 2015-06-18 ENCOUNTER — Telehealth: Payer: Self-pay | Admitting: *Deleted

## 2015-06-18 NOTE — Telephone Encounter (Signed)
Pt called for HFU transition of care.  No answer, message left.

## 2015-06-19 ENCOUNTER — Encounter: Payer: Self-pay | Admitting: Internal Medicine

## 2015-06-20 ENCOUNTER — Telehealth: Payer: Self-pay | Admitting: Internal Medicine

## 2015-06-20 NOTE — Telephone Encounter (Signed)
Call to patient to confirm appointment for 06/21/15 at 1:45 lmtcb

## 2015-06-21 ENCOUNTER — Ambulatory Visit: Payer: Self-pay | Admitting: Internal Medicine

## 2015-06-24 ENCOUNTER — Telehealth: Payer: Self-pay | Admitting: *Deleted

## 2015-06-24 NOTE — Telephone Encounter (Signed)
Pt called for HFU transition of care. No answer, message left.

## 2015-06-25 ENCOUNTER — Encounter: Payer: Self-pay | Admitting: Internal Medicine

## 2015-06-25 ENCOUNTER — Ambulatory Visit (INDEPENDENT_AMBULATORY_CARE_PROVIDER_SITE_OTHER): Payer: Medicaid Other | Admitting: Internal Medicine

## 2015-06-25 VITALS — BP 146/82 | HR 79 | Temp 98.2°F | Ht 61.5 in | Wt 93.8 lb

## 2015-06-25 DIAGNOSIS — L98499 Non-pressure chronic ulcer of skin of other sites with unspecified severity: Secondary | ICD-10-CM | POA: Diagnosis present

## 2015-06-25 NOTE — Patient Instructions (Signed)
PLEASE COMPLETE YOUR ANTIBIOTICS.   FOLLOW UP WITH THE WOUND CARE CENTER.  TAKE IBUPROFEN 1-2 PILLS EVERY 4 TO 6 HOURS AS NEEDED FOR PAIN.

## 2015-06-25 NOTE — Assessment & Plan Note (Signed)
Skin ulcer looks stable to improved from when I admitted her to the hospital 2 weeks ago. No signs of infection. I examined, cleaned and redressed the wound with appropriate dressings. Patient did not pick up her antibiotics until recently and has not completed them. She admits to 8/10 pain but has only been taking one or two advil a day for pain.   Plan: -Follow up with wound care -Provided with additional dressing supplies -Complete antibiotics -Take Ibuprofen 1-2 tabs Q4-6H prn pain

## 2015-06-25 NOTE — Progress Notes (Signed)
Subjective:    [name], female    DOB: 1960/10/10, 54 y.o.   MRN: [id number]  HPI [name] is a 54 y.o. female with PMHx of HTN, cocaine abuse, levimasole induced vasculitis, and leg ulcer who presents to the clinic for follow up for her leg ulcer. Please see A&P for the status of the patient's chronic medical problems.   Past Medical History  Diagnosis Date  . 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 Followed by Dr. Louanne Skye  . Hypertension   . Cocaine abuse     ongoing with resultant vaculitis.  . Depression   . Normocytic anemia     BL Hgb 9.8-12. Last anemia panel 04/2010 - showing Fe 19, ferritin 101.  Pt on monthly B12 injections  . 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 CAP (community acquired pneumonia) 03/2014 X 2  . Daily headache   . Migraines     "probably 5-6/yr" (04/21/2014)  . Rheumatoid arthritis(714.0)     patient reported  . Inflammatory arthritis     Outpatient Encounter Prescriptions as of 06/25/2015  Medication Sig Note  . acyclovir (ZOVIRAX) 800 MG tablet Take 1 tablet (800 mg total) by mouth 5 (five) times daily.   Marland Kitchen amoxicillin-clavulanate (AUGMENTIN) 875-125 MG per tablet Take 1 tablet by mouth 2 (two) times daily.   Marland Kitchen aspirin EC 81 MG EC tablet Take 1 tablet (81 mg total) by mouth daily. (Patient not taking: Reported on 04/21/2015)   . buPROPion (WELLBUTRIN XL) 150 MG 24 hr tablet Take 150 mg by mouth daily. 06/14/2015: .   . ibuprofen (ADVIL,MOTRIN) 600 MG tablet Take 1 tablet (600 mg total) by mouth every 6 (six) hours as needed for fever, headache, mild pain, moderate pain or cramping.   Marland Kitchen lisinopril (PRINIVIL,ZESTRIL) 10 MG tablet Take 1 tablet (10 mg total) by mouth daily. (Patient taking differently: Take 10 mg by mouth daily as needed (for high blood pressure). )   . mupirocin ointment (BACTROBAN) 2 % APPLY TO AFFECTED AREA 3 TIMES DAILY 06/14/2015: .   . silver sulfADIAZINE (SILVADENE) 1 % cream Apply  topically 2 (two) times daily.   . simvastatin (ZOCOR) 20 MG tablet Take 1 tablet (20 mg total) by mouth daily at 6 PM. (Patient not taking: Reported on 05/23/2015)   . sulfamethoxazole-trimethoprim (BACTRIM DS,SEPTRA DS) 800-160 MG per tablet Take 1 tablet by mouth 2 (two) times daily.    No facility-administered encounter medications on file as of 06/25/2015.    Family History  Problem Relation Age of Onset  . Breast cancer Mother     Breast cancer  . Alcohol abuse Mother   . Colon cancer Maternal Aunt 75  . Alcohol abuse Father     Social History   Social History  . Marital Status: Single    Spouse Name: N/A  . Number of Children: 0  . Years of Education: 11th grade   Occupational History  . Disability     since 2011, due to her rheumatoid arthritis   Social History Main Topics  . Smoking status: Current Every Day Smoker -- 0.30 packs/day for 34 years    Types: Cigarettes  . Smokeless tobacco: Never Used     Comment: "Trying very hard".  . Alcohol Use: No  . Drug Use: Yes    Special: "Crack" cocaine, Cocaine     Comment: States she still uses crack "a little  bit".  . Sexual Activity: No   Other Topics Concern  . Not on file   Social History Narrative   Unemployed:  cleaning in past   Living at [place]; has Medicaid.   crack/cocaine use; pt denies IVDU   tobacco:  1/2 ppd, trying to quit   alcohol:  none       Review of Systems General: Denies fever, chills, fatigue  Respiratory: Denies SOB, cough, DOE Cardiovascular: Denies chest pain and palpitations.  Gastrointestinal: Denies nausea, vomiting, abdominal pain Musculoskeletal: Admits to left leg pain. Denies myalgias, back pain, joint swelling, arthralgias and gait problem.  Skin: Denies pallor, rash and wounds.  Neurological: Denies dizziness, headaches, weakness, lightheadedness    Objective:   Physical Exam Filed Vitals:   06/25/15 1447  BP: 146/82  Pulse: 79  Temp: 98.2 F (36.8 C)    TempSrc: Oral  Height: 5' 1.5" (1.562 m)  Weight: 93 lb 12.8 oz (42.547 kg)  SpO2: 100%   General: Vital signs reviewed.  Patient is well-developed and well-nourished, in no acute distress and cooperative with exam.  Cardiovascular: RRR, S1 normal, S2 normal, no murmurs, gallops, or rubs. Pulmonary/Chest: Clear to auscultation bilaterally, no wheezes, rales, or rhonchi. Abdominal: Soft, non-tender, non-distended, BS + Extremities: No lower extremity edema bilaterally, pulses symmetric and intact bilaterally.  Neurological: A&O x3, Strength is normal and symmetric bilaterally, cranial nerve II-XII are grossly intact, no focal motor deficit, sensory intact to light touch bilaterally.  Skin: Multiple old wounds on ears, elbows and fingers which are healed. 4 cm x 5 cm x 0.2 cm wound on medial left lower extremity which is pink, moist, and without necrotic tissue. There is no purulence, erythema, increased warmth to suggest infection or cellulitis.  Psychiatric: Tearful, frustrated. speech and behavior is normal. Cognition and memory are normal.      Assessment & Plan:   Please see problem oriented assessment and plan.

## 2015-06-25 NOTE — Progress Notes (Signed)
[facility] Medicine [facility] Attending  Case discussed with Dr. Richardson at the time of the visit.  We reviewed the resident's history and exam and pertinent patient test results.  I agree with the assessment, diagnosis, and plan of care documented in the resident's note. 

## 2015-06-26 ENCOUNTER — Telehealth: Payer: Self-pay | Admitting: Licensed Clinical Social Worker

## 2015-06-26 NOTE — Telephone Encounter (Signed)
CSW placed call to Ms. Petrosyan to inquire if pt would be interested in referral to [facility] for community care management.  Ms. Schreier would benefit from care management to assist with transportation services utilizing her medicaid benefits and information on substance abuse treatment options.  CSW placed called to pt.  CSW left message requesting return call. CSW provided contact hours and phone number.

## 2015-06-27 NOTE — Telephone Encounter (Signed)
CSW placed call to Ms. Kesselman to discuss Medicaid transportation and referral to [facility].  CSW discussing programs and pt hung up on CSW.  CSW attempted to call back in case there was an unintentional disconnection.  Pt's turned phone off.  CSW will sign off.

## 2015-08-13 ENCOUNTER — Emergency Department (HOSPITAL_COMMUNITY)
Admission: EM | Admit: 2015-08-13 | Discharge: 2015-08-14 | Disposition: A | Discharge status: Home / Self Care | Payer: Medicaid Other | Attending: Emergency Medicine | Admitting: Emergency Medicine

## 2015-08-13 ENCOUNTER — Encounter (HOSPITAL_COMMUNITY): Payer: Self-pay | Admitting: Emergency Medicine

## 2015-08-13 DIAGNOSIS — Z792 Long term (current) use of antibiotics: Secondary | ICD-10-CM | POA: Diagnosis not present

## 2015-08-13 DIAGNOSIS — Z79899 Other long term (current) drug therapy: Secondary | ICD-10-CM | POA: Insufficient documentation

## 2015-08-13 DIAGNOSIS — S81802D Unspecified open wound, left lower leg, subsequent encounter: Secondary | ICD-10-CM | POA: Diagnosis not present

## 2015-08-13 DIAGNOSIS — Z720 Tobacco use: Secondary | ICD-10-CM | POA: Insufficient documentation

## 2015-08-13 DIAGNOSIS — I10 Essential (primary) hypertension: Secondary | ICD-10-CM | POA: Insufficient documentation

## 2015-08-13 DIAGNOSIS — X58XXXD Exposure to other specified factors, subsequent encounter: Secondary | ICD-10-CM | POA: Diagnosis not present

## 2015-08-13 DIAGNOSIS — Z8701 Personal history of pneumonia (recurrent): Secondary | ICD-10-CM | POA: Diagnosis not present

## 2015-08-13 DIAGNOSIS — I776 Arteritis, unspecified: Secondary | ICD-10-CM

## 2015-08-13 DIAGNOSIS — M069 Rheumatoid arthritis, unspecified: Secondary | ICD-10-CM | POA: Diagnosis not present

## 2015-08-13 DIAGNOSIS — F1410 Cocaine abuse, uncomplicated: Secondary | ICD-10-CM | POA: Insufficient documentation

## 2015-08-13 DIAGNOSIS — Z8659 Personal history of other mental and behavioral disorders: Secondary | ICD-10-CM | POA: Diagnosis not present

## 2015-08-13 DIAGNOSIS — L089 Local infection of the skin and subcutaneous tissue, unspecified: Secondary | ICD-10-CM | POA: Diagnosis present

## 2015-08-13 LAB — CBC WITH DIFFERENTIAL/PLATELET
Basophils Absolute: 0 10*3/uL (ref 0.0–0.1)
Basophils Relative: 0 %
EOS ABS: 0.1 10*3/uL (ref 0.0–0.7)
EOS PCT: 1 %
HCT: 32.6 % — ABNORMAL LOW (ref 36.0–46.0)
Hemoglobin: 11.5 g/dL — ABNORMAL LOW (ref 12.0–15.0)
LYMPHS ABS: 2.1 10*3/uL (ref 0.7–4.0)
Lymphocytes Relative: 33 %
MCH: 30.1 pg (ref 26.0–34.0)
MCHC: 35.3 g/dL (ref 30.0–36.0)
MCV: 85.3 fL (ref 78.0–100.0)
MONOS PCT: 3 %
Monocytes Absolute: 0.2 10*3/uL (ref 0.1–1.0)
Neutro Abs: 4.1 10*3/uL (ref 1.7–7.7)
Neutrophils Relative %: 63 %
PLATELETS: 319 10*3/uL (ref 150–400)
RBC: 3.82 MIL/uL — ABNORMAL LOW (ref 3.87–5.11)
RDW: 14.6 % (ref 11.5–15.5)
WBC: 6.4 10*3/uL (ref 4.0–10.5)

## 2015-08-13 LAB — BASIC METABOLIC PANEL
Anion gap: 8 (ref 5–15)
BUN: 24 mg/dL — AB (ref 6–20)
CO2: 27 mmol/L (ref 22–32)
CREATININE: 0.8 mg/dL (ref 0.44–1.00)
Calcium: 9.9 mg/dL (ref 8.9–10.3)
Chloride: 102 mmol/L (ref 101–111)
GFR calc Af Amer: >60 mL/min (ref >60)
Glucose, Bld: 108 mg/dL — ABNORMAL HIGH (ref 65–99)
Potassium: 3.7 mmol/L (ref 3.5–5.1)
SODIUM: 137 mmol/L (ref 135–145)

## 2015-08-13 MED ORDER — OXYCODONE HCL 5 MG PO TABS
5.0000 mg | ORAL_TABLET | Freq: Once | ORAL | Status: AC
  Administered 2015-08-13: 5 mg via ORAL
  Filled 2015-08-13: qty 1

## 2015-08-13 MED ORDER — OXYCODONE HCL 5 MG PO TABS: 5.0000 mg | ORAL_TABLET | ORAL | Status: DC | PRN

## 2015-08-13 MED ORDER — DIAZEPAM 5 MG PO TABS
10.0000 mg | ORAL_TABLET | Freq: Once | ORAL | Status: AC
  Administered 2015-08-13: 10 mg via ORAL
  Filled 2015-08-13: qty 2

## 2015-08-13 MED ORDER — METHOCARBAMOL 500 MG PO TABS: 500.0000 mg | ORAL_TABLET | Freq: Two times a day (BID) | ORAL | Status: DC

## 2015-08-13 NOTE — ED Notes (Signed)
Pt c/o continued 10/10 pain. PA notified.

## 2015-08-13 NOTE — ED Provider Notes (Signed)
Medical screening examination/treatment/procedure(s) were conducted as a shared visit with non-physician practitioner(s) and myself.  I personally evaluated the patient during the encounter.   EKG Interpretation None     Patient with chronic ulcers to her extremity and hands from likely cocaine use. No evidence of systemic infection at this time. Given pain medication here and will follow-up in the outpatient clinic  Lacretia Leigh, MD 08/13/15 2325

## 2015-08-13 NOTE — ED Notes (Signed)
Pt is still reporting 10/10 pain and crying in her room.  PA notified and meds requested.

## 2015-08-13 NOTE — ED Notes (Signed)
Pt presents from home via EMS with multiple open wounds that appear to be infected.  Wounds are present on right hand and right pointer finger, bilateral ears, and left foot.  All have malodorous discharge and are excruciatingly painful.  Pt reports 10/10 pain and is tearful during assessment. She says she was taking antibiotics at home but ran out, and she has attempted to change her bandages at least every three days but has run out of wound care supplies.

## 2015-08-13 NOTE — ED Provider Notes (Signed)
CSN: [id number]     Arrival date & time 08/13/15  1746 History   First MD Initiated Contact with Patient 08/13/15 1811     Chief Complaint  Patient presents with  . Wound Infection     (Consider location/radiation/quality/duration/timing/severity/associated sxs/prior Treatment) HPI Comments: Patient is a 54 year old female with past medical history of hypertension, cocaine abuse, vasculitis who presents to the ED with complaint of multiple wounds. Patient reports she has had wounds to her left lower leg and hand and finger for the past few months. She notes she was seen in the ED in September for left leg wound and notes she was being seen at outpatient wound care. Patient states she was taking antibiotics but has since ran out. She notes she changes the bandages on her leg wound once every 2-3 days. She states she has been putting Silvadene cream on all of her wounds. Denies fever, chills, headache, SOB, CP, abdominal pain, nausea, vomiting, diarrhea, urinary sxs, numbness, tingling, weakness. Patient states she last smoked cocaine 4 days ago, however she notes she has been using it on a daily basis previously. She notes she only smokes cocaine and does not inject.   Past Medical History  Diagnosis Date  . 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 Followed by Dr. Louanne Skye  . Hypertension   . Cocaine abuse     ongoing with resultant vaculitis.  . Depression   . Normocytic anemia     BL Hgb 9.8-12. Last anemia panel 04/2010 - showing Fe 19, ferritin 101.  Pt on monthly B12 injections  . 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 CAP (community acquired pneumonia) 03/2014 X 2  . Daily headache   . Migraines     "probably 5-6/yr" (04/21/2014)  . Rheumatoid arthritis(714.0)     patient reported  . Inflammatory arthritis [facility])    Past Surgical History  Procedure Laterality Date  . Skin biopsy Bilateral 04/2010    shin nodules  . Irrigation and debridement  abscess Bilateral 09/26/2013    Procedure: DEBRIDEMENT ULCERS BILATERAL THIGHS;  Surgeon: Gwenyth Ober, MD;  Location: [place];  Service: General;  Laterality: Bilateral;  . Hernia repair      "stomach"   Family History  Problem Relation Age of Onset  . Breast cancer Mother     Breast cancer  . Alcohol abuse Mother   . Colon cancer Maternal Aunt 45  . Alcohol abuse Father    Social History  Substance Use Topics  . Smoking status: Current Every Day Smoker -- 0.30 packs/day for 34 years    Types: Cigarettes  . Smokeless tobacco: Never Used     Comment: "Trying very hard". / 4-5 CIGARETTES A DAY  . Alcohol Use: No   OB History    No data available     Review of Systems  Skin: Positive for wound.  All other systems reviewed and are negative.     Allergies  Acetaminophen  Home Medications   Prior to Admission medications   Medication Sig Start Date End Date Taking? Authorizing Provider  acyclovir (ZOVIRAX) 800 MG tablet Take 1 tablet (800 mg total) by mouth 5 (five) times daily. 06/08/15   Debby Freiberg, MD  amoxicillin-clavulanate (AUGMENTIN) 875-125 MG per tablet Take 1 tablet by mouth 2 (two) times daily. 06/17/15   Jule Ser, DO  aspirin EC 81 MG EC tablet Take 1 tablet (81 mg total) by mouth  daily. Patient not taking: Reported on 04/21/2015 04/08/15   Milagros Loll, MD  buPROPion (WELLBUTRIN XL) 150 MG 24 hr tablet Take 150 mg by mouth daily. 06/05/15   Historical Provider, MD  ibuprofen (ADVIL,MOTRIN) 600 MG tablet Take 1 tablet (600 mg total) by mouth every 6 (six) hours as needed for fever, headache, mild pain, moderate pain or cramping. 06/17/15   Jule Ser, DO  lisinopril (PRINIVIL,ZESTRIL) 10 MG tablet Take 1 tablet (10 mg total) by mouth daily. Patient taking differently: Take 10 mg by mouth daily as needed (for high blood pressure).  03/11/15   Kelby Aline, MD  mupirocin ointment (BACTROBAN) 2 % APPLY TO AFFECTED AREA 3 TIMES DAILY 06/05/15   Historical  Provider, MD  silver sulfADIAZINE (SILVADENE) 1 % cream Apply topically 2 (two) times daily. 06/17/15   Jule Ser, DO  simvastatin (ZOCOR) 20 MG tablet Take 1 tablet (20 mg total) by mouth daily at 6 PM. Patient not taking: Reported on 05/23/2015 04/22/15   Milagros Loll, MD  sulfamethoxazole-trimethoprim (BACTRIM DS,SEPTRA DS) 800-160 MG per tablet Take 1 tablet by mouth 2 (two) times daily. 06/17/15   Jule Ser, DO   BP 159/95 mmHg  Pulse 105  Temp(Src) 98.3 F (36.8 C) (Oral)  Resp 24  Ht 5' 1"  (1.549 m)  Wt 110 lb (49.896 kg)  BMI 20.80 kg/m2  SpO2 98% Physical Exam  Constitutional: She is oriented to person, place, and time. She appears well-developed and well-nourished.  Crying and yelling throughout exam.   HENT:  Head: Normocephalic and atraumatic.  Mouth/Throat: Oropharynx is clear and moist. No oropharyngeal exudate.  Eyes: Conjunctivae and EOM are normal. Pupils are equal, round, and reactive to light. Right eye exhibits no discharge. Left eye exhibits no discharge. No scleral icterus.  Neck: Normal range of motion. Neck supple.  Cardiovascular: Normal rate, regular rhythm, normal heart sounds and intact distal pulses.   Pulmonary/Chest: Effort normal and breath sounds normal. No respiratory distress. She has no wheezes. She has no rales. She exhibits no tenderness.  Abdominal: Soft. Bowel sounds are normal. She exhibits no distension and no mass. There is no tenderness. There is no rebound and no guarding.  Musculoskeletal: Normal range of motion. She exhibits tenderness. She exhibits no edema.       Left lower leg: She exhibits tenderness. She exhibits no bony tenderness, no swelling, no edema, no deformity and no laceration.  FROM of left leg and foot, 2+ DP and PT pulses, sensation intact, cap refill <2.  Lymphadenopathy:    She has no cervical adenopathy.  Neurological: She is alert and oriented to person, place, and time.  Skin: Skin is warm and dry.  Large  chronic wound 4-5cm in diameter on the left distal medial leg, epidermal tissue loss, no surrounding erythema or edema, malodorous drainage.  2cm ulcer noted to dorsal aspect of right 2nd MCP, no drainage or surrounding edema/erythema.   1.5cm ulcer noted to dorsal right distal 2nd finger with no drainage, or surrounding swelling/erythema.  Bilateral auricles with black necrotic tissue to peripheral auricles, no active bleeding, no drainage or swelling/erythema, TTP.  Nursing note and vitals reviewed.   ED Course  Procedures (including critical care time) Labs Review Labs Reviewed - No data to display  Imaging Review No results found. I have personally reviewed and evaluated these images and lab results as part of my medical decision-making.  Filed Vitals:   08/13/15 2202  BP: 149/82  Pulse: 98  Temp: 98.5 F (36.9 C)  Resp: 20     MDM   Final diagnoses:  Cocaine abuse  Leg wound, left, subsequent encounter  Vasculitis [facility])    Patient presents with multiple wounds. Endorses history of cocaine use, last smoked cocaine 4 days ago. Denies injecting cocaine. VSS. Exam revealed large chronic wound to left distal leg with tissue loss with malodorous serosanguinous drainage, no surrounding erythema or swelling, leg distal pulses 2+. Multiple wounds noted to right hand with no surrounding erythema or swelling. Bilateral ears with black necrotic tissue to peripheral auricles, no drainage or surrounding erythema or swelling. No evidence of cellulitis or systemic infection.  Patient given pain meds. Lab results unremarkable. Patient's chart reviewed which showed she was seen by her PCP in September for similar wounds and was rx antibiotic at that time. Patient has been seen multiple times in the ED for chronic wounds related to vasculitis associated with cocaine abuse. Plan to discharge patient home. Patient given pain meds and follow up for wound care clinic. Saline dressing applied to  patient's wounds.  Evaluation does not show pathology requring ongoing emergent intervention or admission. Pt is hemodynamically stable and mentating appropriately. Discussed findings/results and plan with patient/guardian, who agrees with plan. All questions answered. Return precautions discussed and outpatient follow up given.      Chesley Noon [place], [place] 08/14/15 0014  Lacretia Leigh, MD 08/15/15 1328

## 2015-08-13 NOTE — ED Notes (Signed)
Pt reports that no provider has been in the room to see her since her arrival to ED almost 6 hours ago.  Charge RN consulted per policy.

## 2015-08-13 NOTE — Discharge Instructions (Signed)
Take your medications as prescribed as needed for pain. Follow-up with the wound care center this week. Follow up with your primary care provider for pain control related to chronic wounds. Please return to the Emergency Department if symptoms worsen or new onset of fever, chest pain, difficulty breathing, numbness, tingling, weakness.

## 2015-08-20 ENCOUNTER — Encounter: Payer: Self-pay | Admitting: Student

## 2015-08-23 ENCOUNTER — Ambulatory Visit (INDEPENDENT_AMBULATORY_CARE_PROVIDER_SITE_OTHER): Payer: Medicaid Other | Admitting: Internal Medicine

## 2015-08-23 ENCOUNTER — Encounter: Payer: Self-pay | Admitting: Internal Medicine

## 2015-08-23 VITALS — BP 154/78 | HR 94 | Temp 98.2°F | Ht 61.5 in | Wt 95.5 lb

## 2015-08-23 DIAGNOSIS — I999 Unspecified disorder of circulatory system: Principal | ICD-10-CM

## 2015-08-23 DIAGNOSIS — F14288 Cocaine dependence with other cocaine-induced disorder: Secondary | ICD-10-CM

## 2015-08-23 DIAGNOSIS — L958 Other vasculitis limited to the skin: Secondary | ICD-10-CM | POA: Diagnosis not present

## 2015-08-23 DIAGNOSIS — I10 Essential (primary) hypertension: Secondary | ICD-10-CM

## 2015-08-23 DIAGNOSIS — F14988 Cocaine use, unspecified with other cocaine-induced disorder: Secondary | ICD-10-CM

## 2015-08-23 NOTE — Progress Notes (Signed)
Temp rechecked 97.9. Hilda Blades Yared Barefoot RN 08/23/15 2:50PM

## 2015-08-23 NOTE — Patient Instructions (Signed)
WE WILL SET YOU UP WITH AN APPOINTMENT WITH THE WOUND CARE CLINIC.   PLEASE SCHEDULE A FOLLOW UP APPOINTMENT WITH YOUR PRIMARY CARE DOCTOR IN THE NEXT 1-2 MONTHS.

## 2015-08-23 NOTE — Progress Notes (Signed)
Subjective:    [name], female    DOB: 06-01-1961, 54 y.o.   MRN: [id number]  HPI [name] is a 54 y.o. female with PMHx of levimasole-induced vasculatitis who presents to the clinic for follow up for her chronic ulcers. Please see A&P for the status of the patient's chronic medical problems.   Past Medical History  Diagnosis Date  . 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 Followed by Dr. Louanne Skye  . Hypertension   . Cocaine abuse     ongoing with resultant vaculitis.  . Depression   . Normocytic anemia     BL Hgb 9.8-12. Last anemia panel 04/2010 - showing Fe 19, ferritin 101.  Pt on monthly B12 injections  . 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 CAP (community acquired pneumonia) 03/2014 X 2  . Daily headache   . Migraines     "probably 5-6/yr" (04/21/2014)  . Rheumatoid arthritis(714.0)     patient reported  . Inflammatory arthritis [facility])     Outpatient Encounter Prescriptions as of 08/23/2015  Medication Sig  . acyclovir (ZOVIRAX) 800 MG tablet Take 1 tablet (800 mg total) by mouth 5 (five) times daily. (Patient not taking: Reported on 08/13/2015)  . amoxicillin-clavulanate (AUGMENTIN) 875-125 MG per tablet Take 1 tablet by mouth 2 (two) times daily. (Patient not taking: Reported on 08/13/2015)  . aspirin EC 81 MG EC tablet Take 1 tablet (81 mg total) by mouth daily. (Patient not taking: Reported on 04/21/2015)  . ibuprofen (ADVIL,MOTRIN) 600 MG tablet Take 1 tablet (600 mg total) by mouth every 6 (six) hours as needed for fever, headache, mild pain, moderate pain or cramping.  Marland Kitchen lisinopril (PRINIVIL,ZESTRIL) 10 MG tablet Take 1 tablet (10 mg total) by mouth daily. (Patient not taking: Reported on 08/13/2015)  . methocarbamol (ROBAXIN) 500 MG tablet Take 1 tablet (500 mg total) by mouth 2 (two) times daily.  Marland Kitchen oxyCODONE (ROXICODONE) 5 MG immediate release tablet Take 1 tablet (5 mg total) by mouth every 4 (four) hours as  needed for severe pain.  . silver sulfADIAZINE (SILVADENE) 1 % cream Apply topically 2 (two) times daily.  . simvastatin (ZOCOR) 20 MG tablet Take 1 tablet (20 mg total) by mouth daily at 6 PM. (Patient not taking: Reported on 05/23/2015)  . sulfamethoxazole-trimethoprim (BACTRIM DS,SEPTRA DS) 800-160 MG per tablet Take 1 tablet by mouth 2 (two) times daily. (Patient not taking: Reported on 08/13/2015)   No facility-administered encounter medications on file as of 08/23/2015.    Family History  Problem Relation Age of Onset  . Breast cancer Mother     Breast cancer  . Alcohol abuse Mother   . Colon cancer Maternal Aunt 67  . Alcohol abuse Father     Social History   Social History  . Marital Status: Single    Spouse Name: N/A  . Number of Children: 0  . Years of Education: 11th grade   Occupational History  . Disability     since 2011, due to her rheumatoid arthritis   Social History Main Topics  . Smoking status: Current Every Day Smoker -- 0.30 packs/day for 34 years    Types: Cigarettes  . Smokeless tobacco: Never Used     Comment: "Trying very hard". / 4-5 CIGARETTES A DAY  . Alcohol Use: No  . Drug Use: Yes    Special: "Crack" cocaine, Cocaine  Comment: States she still uses crack "a little bit"., last used 08/08/15  . Sexual Activity: No   Other Topics Concern  . Not on file   Social History Narrative   Unemployed:  cleaning in past   Living at [place]; has Medicaid.   crack/cocaine use; pt denies IVDU   tobacco:  1/2 ppd, trying to quit   alcohol:  none       Review of Systems General: Denies fever, chills, fatigue, change in appetite   HEENT: Admits to ear pain Respiratory: Denies SOB, DOE   Cardiovascular: Denies chest pain and palpitations.  Gastrointestinal: Denies nausea, vomiting, abdominal pain, diarrhea Skin: Admits to chronic ulcers and pain Neurological: Denies dizziness, headaches, weakness, lightheadedness, numbness     Objective:    Physical Exam Filed Vitals:   08/23/15 1411  BP: 154/78  Pulse: 94  Temp: 98.2 F (36.8 C)  TempSrc: Oral  Height: 5' 1.5" (1.562 m)  Weight: 95 lb 8 oz (43.319 kg)  SpO2: 100%   General: Vital signs reviewed.  Patient is thin, chronically ill-appearing, in no acute distress and cooperative with exam.  Ears: Bilateral wounds on superior ear lobes with pieces of ears missing from necrosis.  Cardiovascular: Tachycardic, regular rhythm, S1 normal, S2 normal Pulmonary/Chest: Clear to auscultation bilaterally, no wheezes, rales, or rhonchi. Extremities: No lower extremity edema bilaterally, pulses symmetric and intact bilaterally. Skin: 4 cm x 4 cm chronic wound on medial left lower distal extremity. Tissue is pink with healthy granulation tissue. No surrounding edema or erythema. Tender. Malodorous. No evidence of purulent drainage or infection. Psychiatric: Pleasant. Tearful intermittently.      Assessment & Plan:   Please see problem based assessment and plan.

## 2015-08-23 NOTE — Assessment & Plan Note (Signed)
BP Readings from Last 3 Encounters:  08/23/15 154/78  08/14/15 119/97  06/25/15 146/82    Lab Results  Component Value Date   NA 137 08/13/2015   K 3.7 08/13/2015   CREATININE 0.80 08/13/2015    Assessment: Blood pressure control:  Uncontrolled at this time, controlled last week Progress toward BP goal:   Deteriorated Comments: Not currently on medication, previously on lisinopril 10 mg daily, but not currently.   Plan: Medications:  Continue to monitor off of medications Educational resources provided: brochure, handout, video Self management tools provided:   Other plans: Was well controlled last week, given one time elevation will hold off. Encouraged patient to follow up with PCP in 1-2 months for health maintenance examination.

## 2015-08-23 NOTE — Assessment & Plan Note (Signed)
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 Patient has resulting chronic wounds on lower extremities, ears, and right hand. The largest wound is located on her left medial distal lower extremity. On physical exam, there is not evidence of infection. She has been taking good care of her wounds and has silvadene cream and wound care supplies at home. She does indorse tenderness. She was seen in the ED on 11/8 with increased pain after doing a "bad batch" of cocaine 2 days prior. Wounds did not look infected at that time. She was given Oxycodone for pain. She has not seen the wound clinic recently. She denies fever or chills.   Plan: -Continue wound care at home with silvadene cream -Provided with additional wound care supplies gaze, non-stick pads, tape -Follow up with wound care on 09/19/15 at 2 pm  -Discussed cocaine cessation -Social work consult for assistance with cessation, but also with obtaining basic necessities for patient such as food -Ibuprofen 1-2 tabs Q4H prn pain, AVOID narcotics given substance abuse

## 2015-08-26 ENCOUNTER — Telehealth: Payer: Self-pay | Admitting: Licensed Clinical Social Worker

## 2015-08-26 NOTE — Progress Notes (Signed)
Internal Medicine Clinic Attending  Case discussed with Dr. Richardson soon after the resident saw the patient.  We reviewed the resident's history and exam and pertinent patient test results.  I agree with the assessment, diagnosis, and plan of care documented in the resident's note. 

## 2015-08-26 NOTE — Telephone Encounter (Signed)
CSW placed called to pt.  CSW left message requesting return call. CSW provided contact hours and phone number. 

## 2015-08-28 NOTE — Telephone Encounter (Signed)
CSW placed called to pt.  CSW left message requesting return call. CSW provided contact hours and phone number. 

## 2015-09-02 ENCOUNTER — Encounter: Payer: Self-pay | Admitting: Licensed Clinical Social Worker

## 2015-09-02 NOTE — Telephone Encounter (Signed)
CSW returned voice mail left by [name].  CSW discussed referral recv'd and inquired if pt currently receives food stamps.  [name] is current with [place] Medicaid and food stamps however, [name] states it is not enough to last a month at this time.  Pt aware of hot meals at [facility] but states she does not like to go there.  CSW informed [name] that there are other locations available.  Pt agreeable for CSW to mail Blue/Green book with local pantry and hot meal locations. Address confirmed.  [name] states she is having difficulty accessing Medicaid transportation.  CSW provided the phone number to DSS and that pt will need to speak with her Medicaid caseworker for transportation assessment.  CSW will provide this information in writing and mail along with the nutrition information as pt states she is still "half sleep".  CSW will inquire if pt is eligible for [facility], if so will mail brochure.

## 2015-09-02 NOTE — Telephone Encounter (Signed)
Pt declined information on substance abuse resources.

## 2015-09-07 ENCOUNTER — Encounter (HOSPITAL_COMMUNITY): Payer: Self-pay | Admitting: Emergency Medicine

## 2015-09-07 ENCOUNTER — Emergency Department (HOSPITAL_COMMUNITY)
Admission: EM | Admit: 2015-09-07 | Discharge: 2015-09-07 | Disposition: A | Discharge status: Home / Self Care | Payer: Medicaid Other | Attending: Emergency Medicine | Admitting: Emergency Medicine

## 2015-09-07 ENCOUNTER — Emergency Department (HOSPITAL_COMMUNITY): Payer: Medicaid Other

## 2015-09-07 DIAGNOSIS — M069 Rheumatoid arthritis, unspecified: Secondary | ICD-10-CM | POA: Diagnosis not present

## 2015-09-07 DIAGNOSIS — R21 Rash and other nonspecific skin eruption: Secondary | ICD-10-CM | POA: Diagnosis not present

## 2015-09-07 DIAGNOSIS — Z8659 Personal history of other mental and behavioral disorders: Secondary | ICD-10-CM | POA: Insufficient documentation

## 2015-09-07 DIAGNOSIS — Z79899 Other long term (current) drug therapy: Secondary | ICD-10-CM | POA: Insufficient documentation

## 2015-09-07 DIAGNOSIS — S81802A Unspecified open wound, left lower leg, initial encounter: Secondary | ICD-10-CM | POA: Insufficient documentation

## 2015-09-07 DIAGNOSIS — Z872 Personal history of diseases of the skin and subcutaneous tissue: Secondary | ICD-10-CM | POA: Diagnosis not present

## 2015-09-07 DIAGNOSIS — X58XXXA Exposure to other specified factors, initial encounter: Secondary | ICD-10-CM | POA: Diagnosis not present

## 2015-09-07 DIAGNOSIS — Z8701 Personal history of pneumonia (recurrent): Secondary | ICD-10-CM | POA: Insufficient documentation

## 2015-09-07 DIAGNOSIS — I10 Essential (primary) hypertension: Secondary | ICD-10-CM | POA: Diagnosis not present

## 2015-09-07 DIAGNOSIS — Z862 Personal history of diseases of the blood and blood-forming organs and certain disorders involving the immune mechanism: Secondary | ICD-10-CM | POA: Diagnosis not present

## 2015-09-07 DIAGNOSIS — Y998 Other external cause status: Secondary | ICD-10-CM | POA: Insufficient documentation

## 2015-09-07 DIAGNOSIS — F17210 Nicotine dependence, cigarettes, uncomplicated: Secondary | ICD-10-CM | POA: Insufficient documentation

## 2015-09-07 DIAGNOSIS — Y9289 Other specified places as the place of occurrence of the external cause: Secondary | ICD-10-CM | POA: Diagnosis not present

## 2015-09-07 DIAGNOSIS — J069 Acute upper respiratory infection, unspecified: Secondary | ICD-10-CM | POA: Insufficient documentation

## 2015-09-07 DIAGNOSIS — Z7982 Long term (current) use of aspirin: Secondary | ICD-10-CM | POA: Insufficient documentation

## 2015-09-07 DIAGNOSIS — G43909 Migraine, unspecified, not intractable, without status migrainosus: Secondary | ICD-10-CM | POA: Diagnosis not present

## 2015-09-07 DIAGNOSIS — R0981 Nasal congestion: Secondary | ICD-10-CM | POA: Diagnosis present

## 2015-09-07 DIAGNOSIS — Y9389 Activity, other specified: Secondary | ICD-10-CM | POA: Diagnosis not present

## 2015-09-07 LAB — CBC WITH DIFFERENTIAL/PLATELET
BASOS ABS: 0 10*3/uL (ref 0.0–0.1)
BASOS PCT: 0 %
Eosinophils Absolute: 0.1 10*3/uL (ref 0.0–0.7)
Eosinophils Relative: 3 %
HEMATOCRIT: 29.6 % — AB (ref 36.0–46.0)
HEMOGLOBIN: 9.8 g/dL — AB (ref 12.0–15.0)
Lymphocytes Relative: 34 %
Lymphs Abs: 1.2 10*3/uL (ref 0.7–4.0)
MCH: 28.2 pg (ref 26.0–34.0)
MCHC: 33.1 g/dL (ref 30.0–36.0)
MCV: 85.3 fL (ref 78.0–100.0)
Monocytes Absolute: 0.1 10*3/uL (ref 0.1–1.0)
Monocytes Relative: 1 %
NEUTROS ABS: 2.2 10*3/uL (ref 1.7–7.7)
NEUTROS PCT: 62 %
Platelets: 291 10*3/uL (ref 150–400)
RBC: 3.47 MIL/uL — AB (ref 3.87–5.11)
RDW: 14.1 % (ref 11.5–15.5)
WBC: 3.6 10*3/uL — ABNORMAL LOW (ref 4.0–10.5)

## 2015-09-07 LAB — BASIC METABOLIC PANEL
ANION GAP: 8 (ref 5–15)
BUN: 16 mg/dL (ref 6–20)
CHLORIDE: 105 mmol/L (ref 101–111)
CO2: 25 mmol/L (ref 22–32)
Calcium: 8.6 mg/dL — ABNORMAL LOW (ref 8.9–10.3)
Creatinine, Ser: 0.72 mg/dL (ref 0.44–1.00)
GFR calc non Af Amer: >60 mL/min (ref >60)
GLUCOSE: 117 mg/dL — AB (ref 65–99)
POTASSIUM: 3.2 mmol/L — AB (ref 3.5–5.1)
Sodium: 138 mmol/L (ref 135–145)

## 2015-09-07 MED ORDER — IBUPROFEN 400 MG PO TABS
400.0000 mg | ORAL_TABLET | Freq: Once | ORAL | Status: AC
  Administered 2015-09-07: 400 mg via ORAL
  Filled 2015-09-07: qty 1

## 2015-09-07 MED ORDER — BENZONATATE 200 MG PO CAPS: 200.0000 mg | ORAL_CAPSULE | Freq: Three times a day (TID) | ORAL | Status: DC | PRN

## 2015-09-07 NOTE — ED Notes (Signed)
Pt. Stated, I've had congestion and spitting up stuff for about  A week.

## 2015-09-07 NOTE — ED Notes (Signed)
Patient transported to X-ray 

## 2015-09-07 NOTE — ED Notes (Signed)
Pt given discharge papers. Pt given [place] sandwich meal, crackers and sprite.

## 2015-09-07 NOTE — ED Provider Notes (Signed)
CSN: [id number]     Arrival date & time 09/07/15  1302 History   First MD Initiated Contact with Patient 09/07/15 1521     Chief Complaint  Patient presents with  . Nasal Congestion    Patient is a 54 y.o. female presenting with cough. The history is provided by the patient.  Cough Cough characteristics:  Productive Sputum characteristics:  Yellow Severity:  Moderate Onset quality:  Gradual Duration:  4 days Timing:  Constant Progression:  Unchanged Chronicity:  New Smoker: yes   Relieved by:  Nothing Ineffective treatments:  None tried Associated symptoms: chest pain, rash, rhinorrhea and sinus congestion   Associated symptoms: no fever, no headaches, no shortness of breath and no sore throat     Past Medical History  Diagnosis Date  . 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 Followed by Dr. Louanne Skye  . Hypertension   . Cocaine abuse     ongoing with resultant vaculitis.  . Depression   . Normocytic anemia     BL Hgb 9.8-12. Last anemia panel 04/2010 - showing Fe 19, ferritin 101.  Pt on monthly B12 injections  . 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 CAP (community acquired pneumonia) 03/2014 X 2  . Daily headache   . Migraines     "probably 5-6/yr" (04/21/2014)  . Rheumatoid arthritis(714.0)     patient reported  . Inflammatory arthritis [facility])    Past Surgical History  Procedure Laterality Date  . Skin biopsy Bilateral 04/2010    shin nodules  . Irrigation and debridement abscess Bilateral 09/26/2013    Procedure: DEBRIDEMENT ULCERS BILATERAL THIGHS;  Surgeon: Gwenyth Ober, MD;  Location: [facility];  Service: General;  Laterality: Bilateral;  . Hernia repair      "stomach"   Family History  Problem Relation Age of Onset  . Breast cancer Mother     Breast cancer  . Alcohol abuse Mother   . Colon cancer Maternal Aunt 37  . Alcohol abuse Father    Social History  Substance Use Topics  . Smoking status: Current Every Day  Smoker -- 0.30 packs/day for 34 years    Types: Cigarettes  . Smokeless tobacco: Never Used     Comment: "Trying very hard". / 4-5 CIGARETTES A DAY  . Alcohol Use: No   OB History    No data available     Review of Systems  Constitutional: Negative for fever.  HENT: Positive for congestion and rhinorrhea. Negative for sore throat.   Eyes: Negative for visual disturbance.  Respiratory: Positive for cough. Negative for chest tightness and shortness of breath.   Cardiovascular: Positive for chest pain. Negative for palpitations.  Gastrointestinal: Negative for nausea, vomiting, abdominal pain and constipation.  Genitourinary: Negative for dysuria and hematuria.  Musculoskeletal: Negative for back pain and neck pain.  Skin: Positive for rash.  Neurological: Negative for dizziness and headaches.  Psychiatric/Behavioral: Negative for confusion.  All other systems reviewed and are negative.  Allergies  Acetaminophen  Home Medications   Prior to Admission medications   Medication Sig Start Date End Date Taking? Authorizing Provider  acyclovir (ZOVIRAX) 800 MG tablet Take 1 tablet (800 mg total) by mouth 5 (five) times daily. Patient not taking: Reported on 08/13/2015 06/08/15   Debby Freiberg, MD  amoxicillin-clavulanate (AUGMENTIN) 875-125 MG per tablet Take 1 tablet by mouth 2 (two) times daily. Patient not taking: Reported on 08/13/2015 06/17/15  Jule Ser, DO  aspirin EC 81 MG EC tablet Take 1 tablet (81 mg total) by mouth daily. Patient not taking: Reported on 04/21/2015 04/08/15   Milagros Loll, MD  ibuprofen (ADVIL,MOTRIN) 600 MG tablet Take 1 tablet (600 mg total) by mouth every 6 (six) hours as needed for fever, headache, mild pain, moderate pain or cramping. 06/17/15   Jule Ser, DO  lisinopril (PRINIVIL,ZESTRIL) 10 MG tablet Take 1 tablet (10 mg total) by mouth daily. Patient not taking: Reported on 08/13/2015 03/11/15   Kelby Aline, MD  methocarbamol (ROBAXIN) 500 MG  tablet Take 1 tablet (500 mg total) by mouth 2 (two) times daily. 08/13/15   Nona Dell, PA-C  oxyCODONE (ROXICODONE) 5 MG immediate release tablet Take 1 tablet (5 mg total) by mouth every 4 (four) hours as needed for severe pain. 08/13/15   Nona Dell, PA-C  silver sulfADIAZINE (SILVADENE) 1 % cream Apply topically 2 (two) times daily. 06/17/15   Jule Ser, DO  simvastatin (ZOCOR) 20 MG tablet Take 1 tablet (20 mg total) by mouth daily at 6 PM. Patient not taking: Reported on 05/23/2015 04/22/15   Milagros Loll, MD  sulfamethoxazole-trimethoprim (BACTRIM DS,SEPTRA DS) 800-160 MG per tablet Take 1 tablet by mouth 2 (two) times daily. Patient not taking: Reported on 08/13/2015 06/17/15   Jule Ser, DO   BP 144/77 mmHg  Pulse 87  Temp(Src) 98 F (36.7 C) (Oral)  Resp 22  SpO2 99% Physical Exam  Constitutional: She is oriented to person, place, and time. She appears well-developed and well-nourished. No distress.  tearful  HENT:  Head: Normocephalic and atraumatic.  Right Ear: Tympanic membrane is not erythematous.  Left Ear: Tympanic membrane is not erythematous.  Nose: Mucosal edema (left ) and rhinorrhea present. No septal deviation.  Mouth/Throat: Oropharynx is clear and moist. No oropharyngeal exudate or posterior oropharyngeal erythema.  Eyes: EOM are normal. Pupils are equal, round, and reactive to light.  Neck: Neck supple. No JVD present.  Cardiovascular: Normal rate, regular rhythm, normal heart sounds and intact distal pulses.  Exam reveals no gallop.   No murmur heard. Pulmonary/Chest: Effort normal and breath sounds normal. She has no decreased breath sounds. She has no wheezes. She has no rales.  Abdominal: Soft. She exhibits no distension. There is no tenderness.  Musculoskeletal: Normal range of motion. She exhibits no tenderness.  Neurological: She is alert and oriented to person, place, and time. No cranial nerve deficit. She exhibits  normal muscle tone.  Skin: Skin is warm and dry. No rash noted.  Chronic left lower leg wound  Psychiatric: Her behavior is normal.    ED Course  Procedures  None   Labs Review Labs Reviewed  CBC WITH DIFFERENTIAL/PLATELET - Abnormal; Notable for the following:    WBC 3.6 (*)    RBC 3.47 (*)    Hemoglobin 9.8 (*)    HCT 29.6 (*)    All other components within normal limits  BASIC METABOLIC PANEL - Abnormal; Notable for the following:    Potassium 3.2 (*)    Glucose, Bld 117 (*)    Calcium 8.6 (*)    All other components within normal limits    Imaging Review Dg Chest 2 View  09/07/2015  CLINICAL DATA:  Productive cough EXAM: CHEST  2 VIEW COMPARISON:  04/21/2015 chest radiograph. FINDINGS: Stable cardiomediastinal silhouette with normal heart size. No pneumothorax. No pleural effusion. Stable minimal scarring in the mid to upper right lung. No  new focal lung opacity. No pulmonary edema. IMPRESSION: No active cardiopulmonary disease. Electronically Signed   By: Ilona Sorrel M.D.   On: 09/07/2015 17:20   I have personally reviewed and evaluated these images and lab results as part of my medical decision-making.   MDM   Final diagnoses:  URI (upper respiratory infection)  Leg wound, left, initial encounter   54 yo AAF with cocaine abuse, HTN, RA and vasculitis who presents with productive cough x 4 days causing chest pain worse with coughing. Sputum production is yellow. Denies fevers or chills. Also reports nasal congestion and LLE pain from chronic wound. AF, VSS here. CXR reviewed by me negative for acute pulmonary process. Cardiomediastinal silhouette normal.   I reviewed the EKG. NSR, rate 95, persistent nonspecific/biphasic T waves in precordial leads similar to prior EKG. No acute STE elevations or depressions. QTc more prolonged than prior at 525. No brugada, no wellens, no WPW, no ectopy.  Feel symptoms consistent with URI. Recommended symptomatic management with nasal  decongestants and NSAIDs. I provided verbal discharge instructions including follow up and return precautions. Feel she is stable for d/c.    Discussed with Dr. Lita Mains.   Gustavus Bryant, MD 09/08/15 Laureen Abrahams  Julianne Rice, MD 09/10/15 [phone]

## 2015-09-07 NOTE — ED Notes (Signed)
Pt has wound to left lower leg, reports it has been there for extended amount of time since having boil removed. Pt removed bandage, foul odor noted but no redness or swelling noted. Wound cleaned and non stick bandage applied.

## 2015-09-19 ENCOUNTER — Encounter (HOSPITAL_BASED_OUTPATIENT_CLINIC_OR_DEPARTMENT_OTHER): Payer: Medicaid Other | Attending: Internal Medicine

## 2015-09-19 DIAGNOSIS — L958 Other vasculitis limited to the skin: Secondary | ICD-10-CM | POA: Diagnosis not present

## 2015-09-19 DIAGNOSIS — I10 Essential (primary) hypertension: Secondary | ICD-10-CM | POA: Diagnosis not present

## 2015-09-19 DIAGNOSIS — L97221 Non-pressure chronic ulcer of left calf limited to breakdown of skin: Secondary | ICD-10-CM | POA: Insufficient documentation

## 2015-09-19 DIAGNOSIS — M069 Rheumatoid arthritis, unspecified: Secondary | ICD-10-CM | POA: Insufficient documentation

## 2015-09-19 DIAGNOSIS — L97211 Non-pressure chronic ulcer of right calf limited to breakdown of skin: Secondary | ICD-10-CM | POA: Insufficient documentation

## 2015-09-19 DIAGNOSIS — M1990 Unspecified osteoarthritis, unspecified site: Secondary | ICD-10-CM | POA: Insufficient documentation

## 2015-09-19 DIAGNOSIS — F14188 Cocaine abuse with other cocaine-induced disorder: Secondary | ICD-10-CM | POA: Insufficient documentation

## 2015-09-25 ENCOUNTER — Inpatient Hospital Stay (HOSPITAL_COMMUNITY)
Admission: EM | Admit: 2015-09-25 | Discharge: 2015-10-02 | DRG: 577 | Disposition: A | Discharge status: Skilled Nursing Facility | Payer: Medicaid Other | Attending: Internal Medicine | Admitting: Internal Medicine

## 2015-09-25 ENCOUNTER — Emergency Department (HOSPITAL_COMMUNITY): Payer: Medicaid Other

## 2015-09-25 ENCOUNTER — Encounter (HOSPITAL_COMMUNITY): Payer: Self-pay | Admitting: Emergency Medicine

## 2015-09-25 DIAGNOSIS — I776 Arteritis, unspecified: Secondary | ICD-10-CM | POA: Diagnosis not present

## 2015-09-25 DIAGNOSIS — F17210 Nicotine dependence, cigarettes, uncomplicated: Secondary | ICD-10-CM | POA: Diagnosis present

## 2015-09-25 DIAGNOSIS — E785 Hyperlipidemia, unspecified: Secondary | ICD-10-CM | POA: Diagnosis present

## 2015-09-25 DIAGNOSIS — B9561 Methicillin susceptible Staphylococcus aureus infection as the cause of diseases classified elsewhere: Secondary | ICD-10-CM | POA: Diagnosis present

## 2015-09-25 DIAGNOSIS — F1290 Cannabis use, unspecified, uncomplicated: Secondary | ICD-10-CM | POA: Diagnosis present

## 2015-09-25 DIAGNOSIS — T148 Other injury of unspecified body region: Secondary | ICD-10-CM | POA: Diagnosis not present

## 2015-09-25 DIAGNOSIS — L089 Local infection of the skin and subcutaneous tissue, unspecified: Secondary | ICD-10-CM | POA: Diagnosis not present

## 2015-09-25 DIAGNOSIS — Z7982 Long term (current) use of aspirin: Secondary | ICD-10-CM | POA: Diagnosis not present

## 2015-09-25 DIAGNOSIS — I10 Essential (primary) hypertension: Secondary | ICD-10-CM | POA: Diagnosis present

## 2015-09-25 DIAGNOSIS — M79604 Pain in right leg: Secondary | ICD-10-CM | POA: Diagnosis present

## 2015-09-25 DIAGNOSIS — R079 Chest pain, unspecified: Secondary | ICD-10-CM | POA: Diagnosis present

## 2015-09-25 DIAGNOSIS — S81801A Unspecified open wound, right lower leg, initial encounter: Secondary | ICD-10-CM

## 2015-09-25 DIAGNOSIS — Z79899 Other long term (current) drug therapy: Secondary | ICD-10-CM | POA: Diagnosis not present

## 2015-09-25 DIAGNOSIS — F1410 Cocaine abuse, uncomplicated: Secondary | ICD-10-CM | POA: Diagnosis present

## 2015-09-25 DIAGNOSIS — L959 Vasculitis limited to the skin, unspecified: Secondary | ICD-10-CM | POA: Diagnosis present

## 2015-09-25 DIAGNOSIS — L97509 Non-pressure chronic ulcer of other part of unspecified foot with unspecified severity: Secondary | ICD-10-CM

## 2015-09-25 DIAGNOSIS — T374X5A Adverse effect of anthelminthics, initial encounter: Secondary | ICD-10-CM | POA: Diagnosis present

## 2015-09-25 DIAGNOSIS — M069 Rheumatoid arthritis, unspecified: Secondary | ICD-10-CM | POA: Diagnosis present

## 2015-09-25 DIAGNOSIS — L97219 Non-pressure chronic ulcer of right calf with unspecified severity: Secondary | ICD-10-CM | POA: Diagnosis present

## 2015-09-25 DIAGNOSIS — Z9119 Patient's noncompliance with other medical treatment and regimen: Secondary | ICD-10-CM

## 2015-09-25 DIAGNOSIS — L739 Follicular disorder, unspecified: Secondary | ICD-10-CM | POA: Diagnosis present

## 2015-09-25 DIAGNOSIS — T148XXA Other injury of unspecified body region, initial encounter: Secondary | ICD-10-CM

## 2015-09-25 LAB — BASIC METABOLIC PANEL
ANION GAP: 9 (ref 5–15)
BUN: 14 mg/dL (ref 6–20)
CHLORIDE: 104 mmol/L (ref 101–111)
CO2: 24 mmol/L (ref 22–32)
Calcium: 9 mg/dL (ref 8.9–10.3)
Creatinine, Ser: 0.73 mg/dL (ref 0.44–1.00)
GFR calc non Af Amer: >60 mL/min (ref >60)
Glucose, Bld: 96 mg/dL (ref 65–99)
Potassium: 3.7 mmol/L (ref 3.5–5.1)
SODIUM: 137 mmol/L (ref 135–145)

## 2015-09-25 LAB — CBC
HCT: 31.2 % — ABNORMAL LOW (ref 36.0–46.0)
HEMOGLOBIN: 10.6 g/dL — AB (ref 12.0–15.0)
MCH: 29.2 pg (ref 26.0–34.0)
MCHC: 34 g/dL (ref 30.0–36.0)
MCV: 86 fL (ref 78.0–100.0)
PLATELETS: 309 10*3/uL (ref 150–400)
RBC: 3.63 MIL/uL — AB (ref 3.87–5.11)
RDW: 13.7 % (ref 11.5–15.5)
WBC: 4.7 10*3/uL (ref 4.0–10.5)

## 2015-09-25 LAB — DIFFERENTIAL
Basophils Absolute: 0 10*3/uL (ref 0.0–0.1)
Basophils Relative: 0 %
EOS ABS: 0.1 10*3/uL (ref 0.0–0.7)
EOS PCT: 1 %
Lymphocytes Relative: 31 %
Lymphs Abs: 1.5 10*3/uL (ref 0.7–4.0)
MONO ABS: 0.1 10*3/uL (ref 0.1–1.0)
Monocytes Relative: 2 %
NEUTROS PCT: 66 %
Neutro Abs: 3.1 10*3/uL (ref 1.7–7.7)

## 2015-09-25 LAB — RAPID URINE DRUG SCREEN, HOSP PERFORMED
Amphetamines: NOT DETECTED
BARBITURATES: NOT DETECTED
BENZODIAZEPINES: NOT DETECTED
Cocaine: POSITIVE — AB
Opiates: NOT DETECTED
TETRAHYDROCANNABINOL: NOT DETECTED

## 2015-09-25 LAB — HEPATIC FUNCTION PANEL
ALK PHOS: 60 U/L (ref 38–126)
ALT: 10 U/L — AB (ref 14–54)
AST: 19 U/L (ref 15–41)
Albumin: 2.9 g/dL — ABNORMAL LOW (ref 3.5–5.0)
Total Bilirubin: 0.5 mg/dL (ref 0.3–1.2)
Total Protein: 6.2 g/dL — ABNORMAL LOW (ref 6.5–8.1)

## 2015-09-25 LAB — RAPID HIV SCREEN (HIV 1/2 AB+AG)
HIV 1/2 ANTIBODIES: NONREACTIVE
HIV-1 P24 ANTIGEN - HIV24: NONREACTIVE

## 2015-09-25 LAB — I-STAT TROPONIN, ED: TROPONIN I, POC: 0.01 ng/mL (ref 0.00–0.08)

## 2015-09-25 LAB — URINALYSIS, ROUTINE W REFLEX MICROSCOPIC
BILIRUBIN URINE: NEGATIVE
GLUCOSE, UA: NEGATIVE mg/dL
Hgb urine dipstick: NEGATIVE
KETONES UR: NEGATIVE mg/dL
Leukocytes, UA: NEGATIVE
Nitrite: NEGATIVE
PH: 7.5 (ref 5.0–8.0)
Protein, ur: NEGATIVE mg/dL
SPECIFIC GRAVITY, URINE: 1.008 (ref 1.005–1.030)

## 2015-09-25 LAB — PHOSPHORUS: Phosphorus: 4.4 mg/dL (ref 2.5–4.6)

## 2015-09-25 LAB — TROPONIN I: Troponin I: <0.03 ng/mL (ref <0.031)

## 2015-09-25 LAB — MAGNESIUM: Magnesium: 1.9 mg/dL (ref 1.7–2.4)

## 2015-09-25 LAB — PROTIME-INR
INR: 1.18 (ref 0.00–1.49)
Prothrombin Time: 15.2 seconds (ref 11.6–15.2)

## 2015-09-25 IMAGING — CR DG FEMUR 2V*L*
4 series · 4 of 4 positions shown · non-contrast
Comparison: 09/24/2013

CLINICAL DATA: Open sores near the knee.  Evaluate for infection

EXAM:
LEFT FEMUR - 2 VIEW

[t femur with hip  ap left]
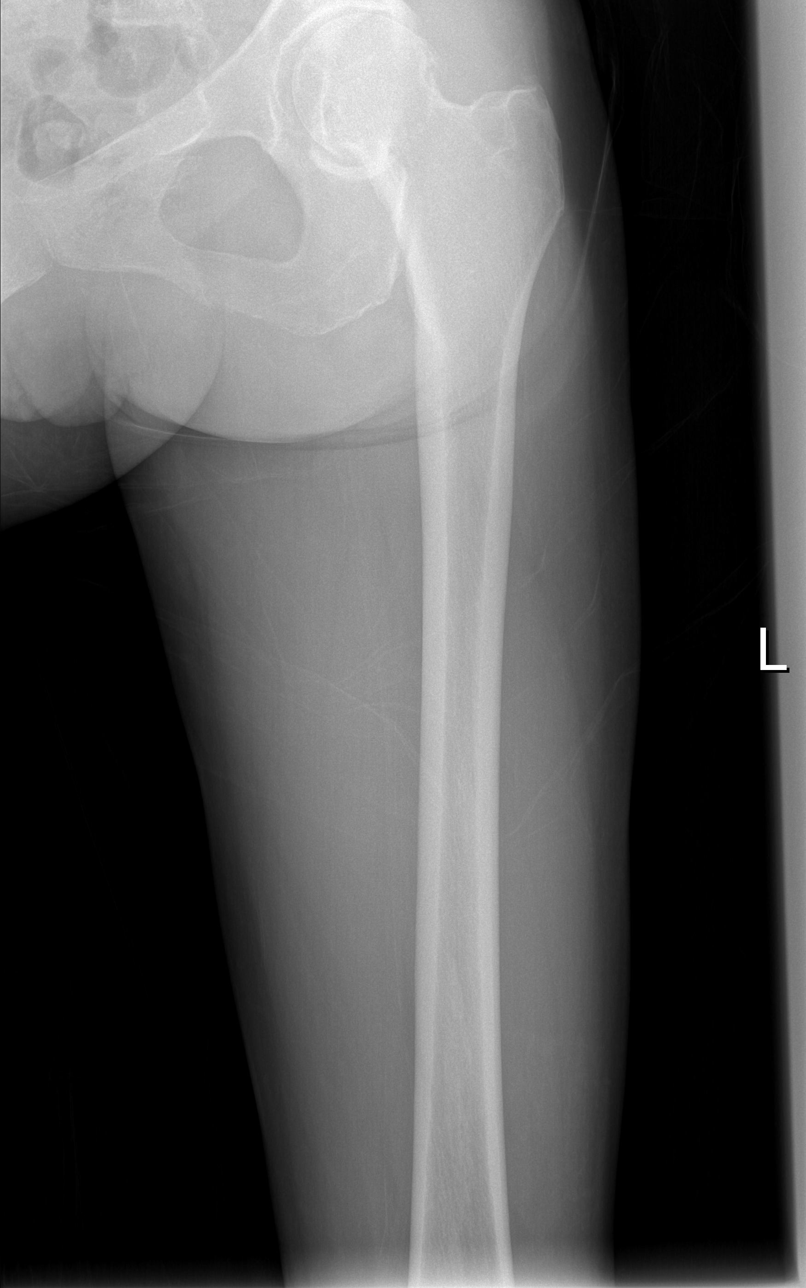

[t femur with knee ap left]
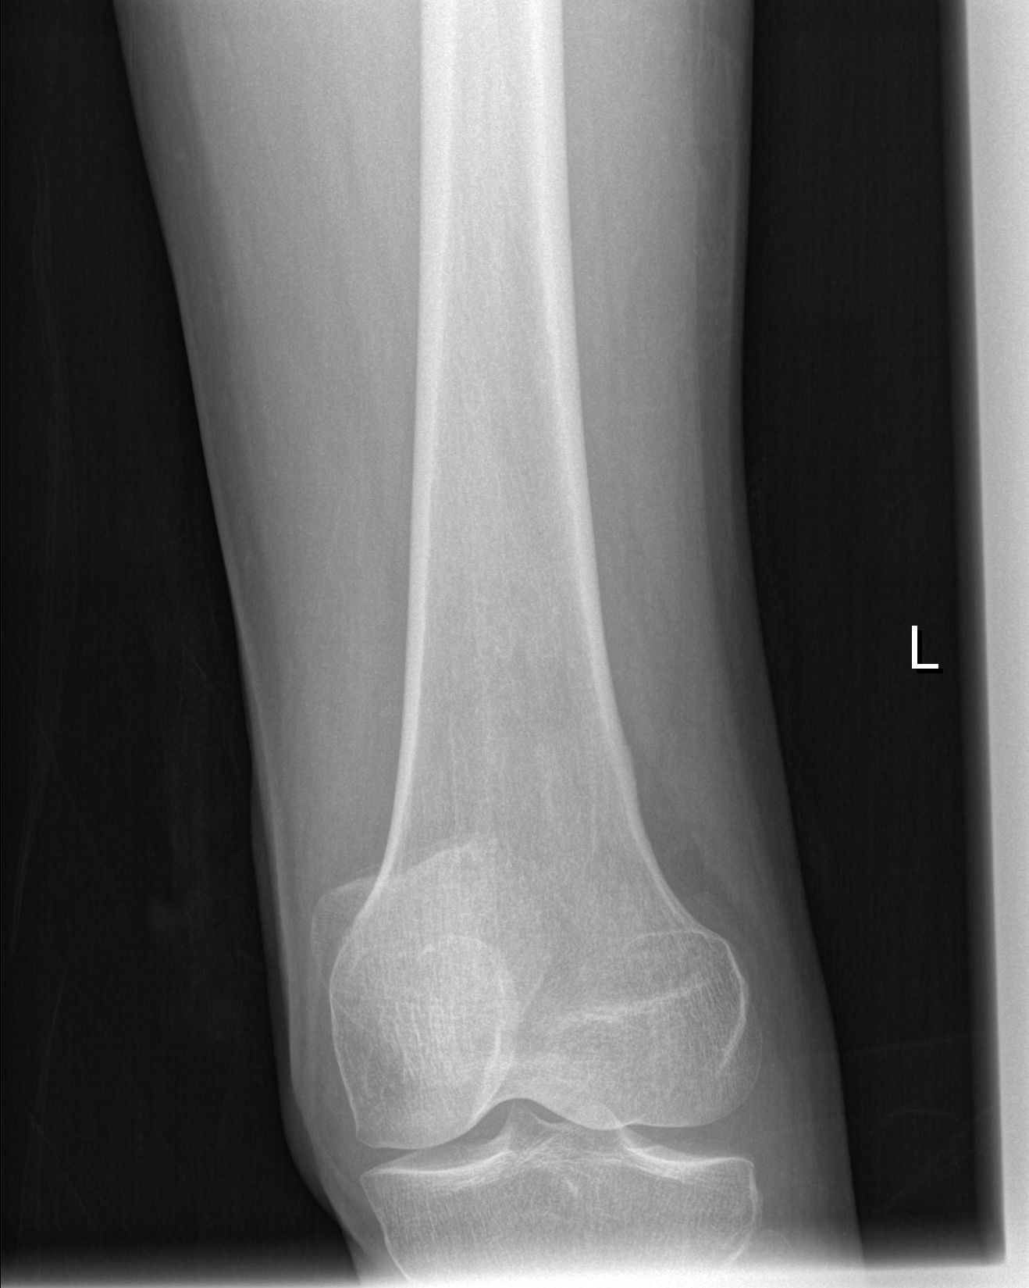

[t femur with hip lat left]
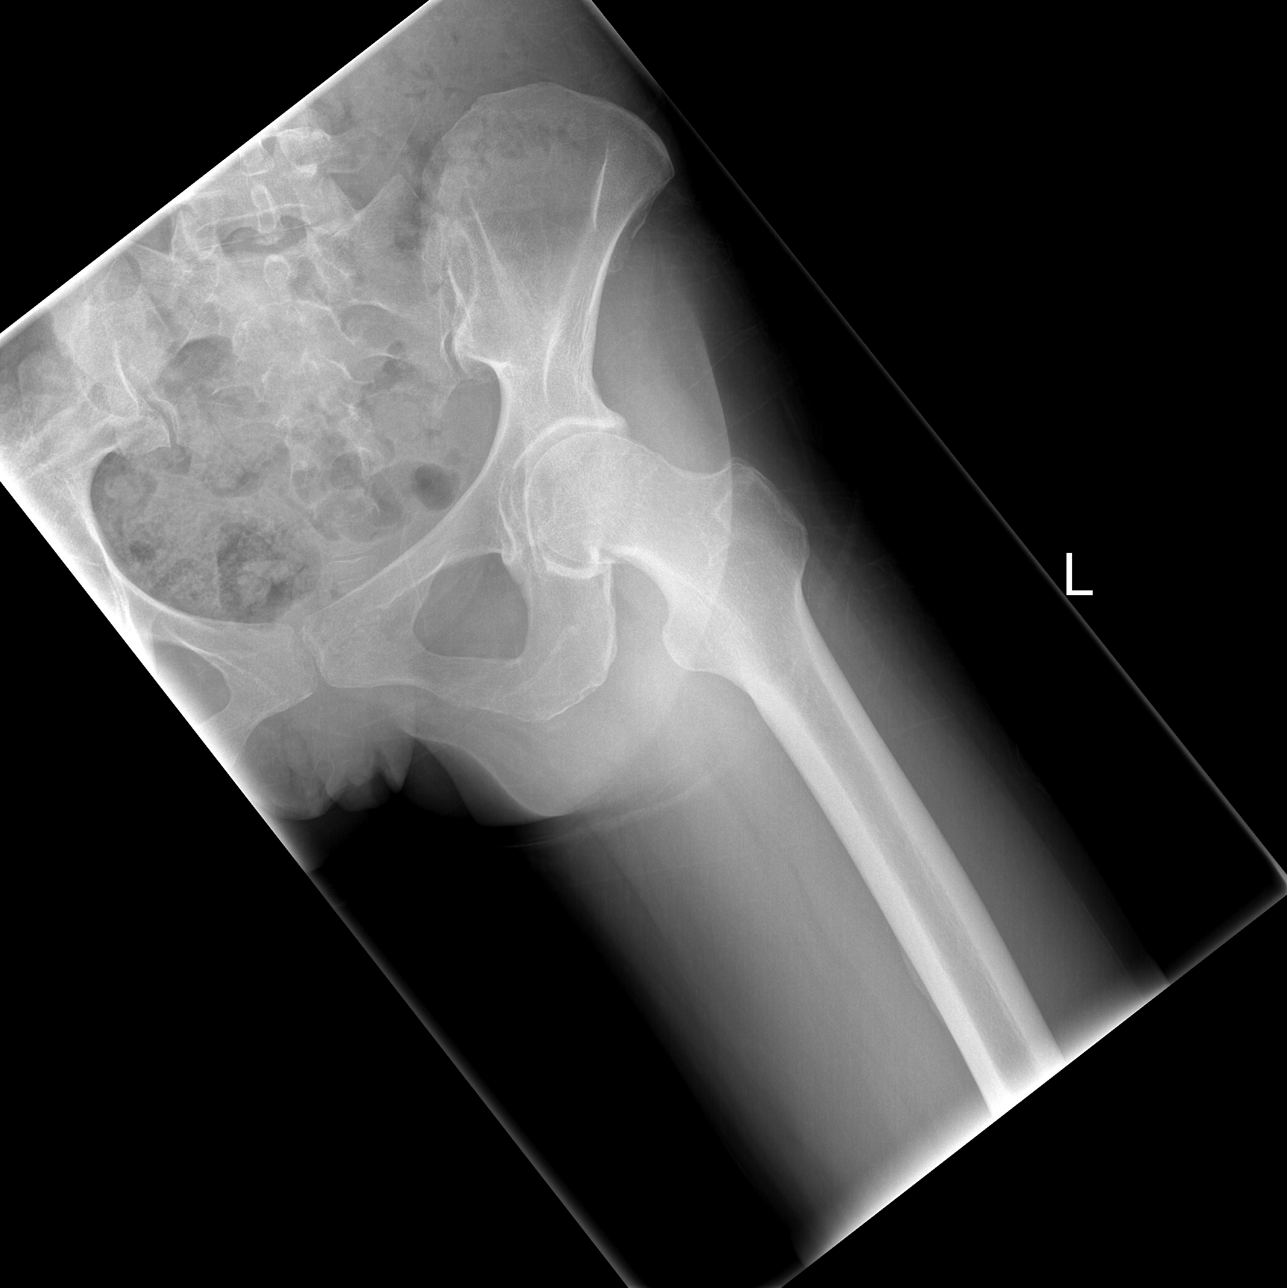

[t femur with knee lat left]
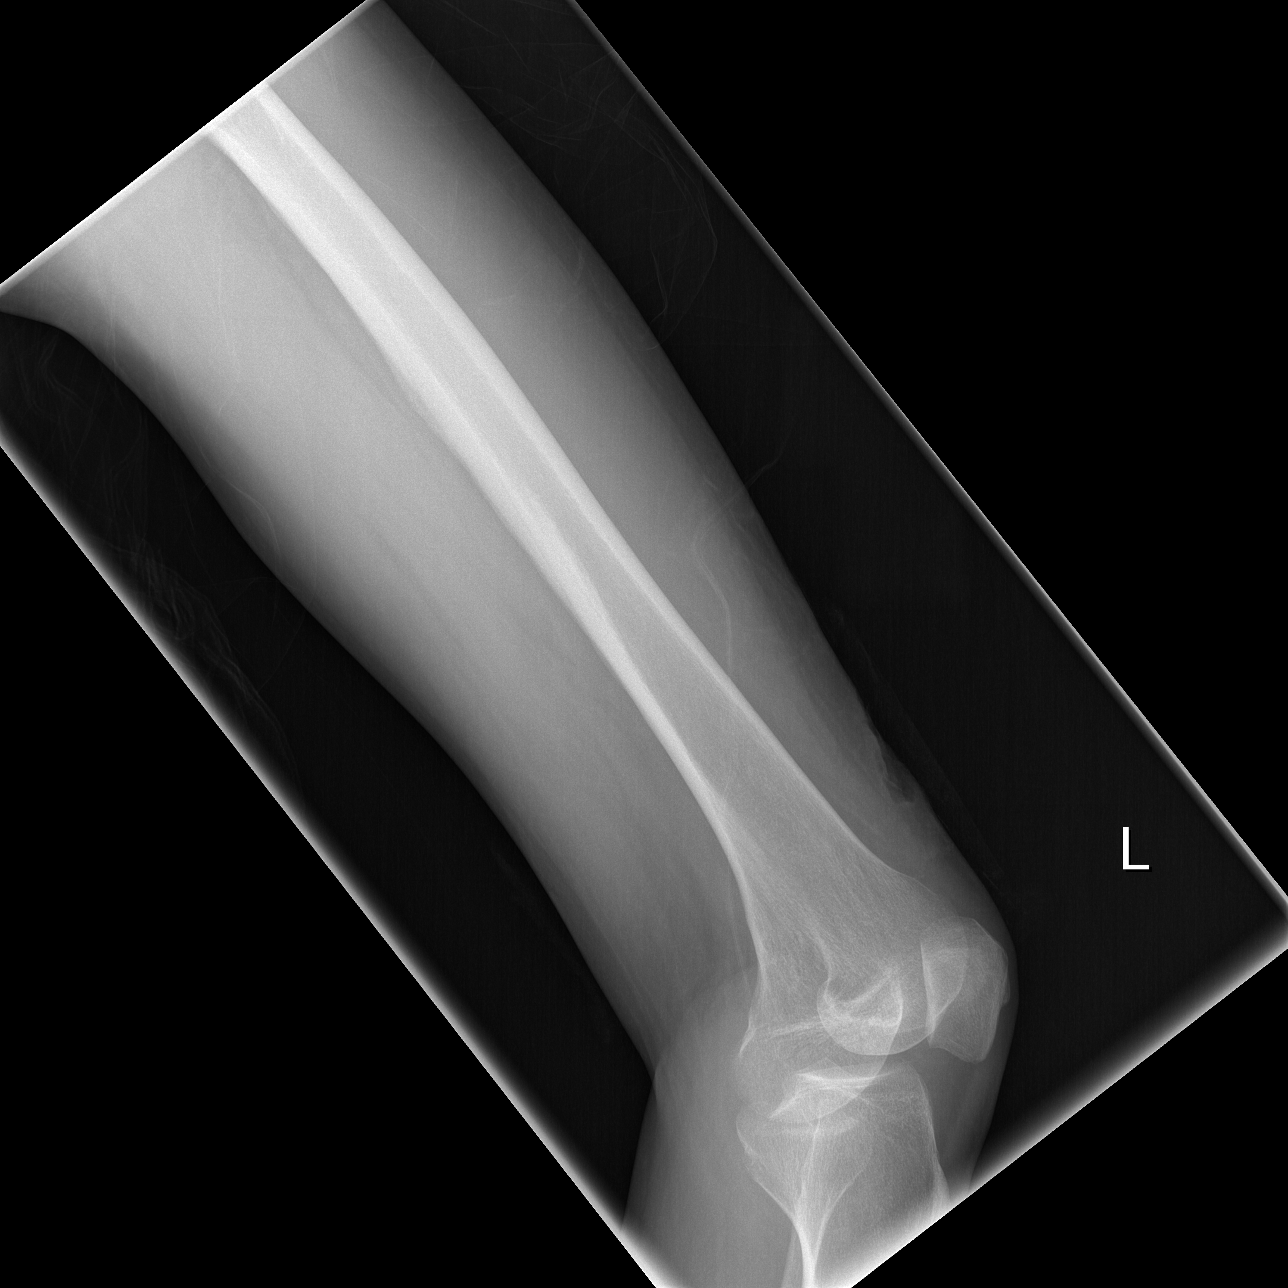

[4 of 4 positions shown; findings below may reference images not displayed]

FINDINGS: There is a persistent ulcer along the anterior lower thigh. No
subcutaneous emphysema. No radiodense foreign body. No changes of
osteomyelitis.

Left hip osteoarthritis with marginal spurring.
IMPRESSION: No evidence of osteomyelitis or foreign body near the lower thigh
ulcer.

## 2015-09-25 MED ORDER — PIPERACILLIN-TAZOBACTAM 3.375 G IVPB 30 MIN
3.3750 g | Freq: Once | INTRAVENOUS | Status: AC
  Administered 2015-09-25: 3.375 g via INTRAVENOUS
  Filled 2015-09-25: qty 50

## 2015-09-25 MED ORDER — CHLORHEXIDINE GLUCONATE 0.12 % MT SOLN
15.0000 mL | Freq: Two times a day (BID) | OROMUCOSAL | Status: DC
  Administered 2015-09-25 – 2015-09-29 (×9): 15 mL via OROMUCOSAL
  Filled 2015-09-25 (×9): qty 15

## 2015-09-25 MED ORDER — CETYLPYRIDINIUM CHLORIDE 0.05 % MT LIQD
7.0000 mL | Freq: Two times a day (BID) | OROMUCOSAL | Status: DC
  Administered 2015-09-26 – 2015-09-29 (×4): 7 mL via OROMUCOSAL

## 2015-09-25 MED ORDER — ASPIRIN EC 81 MG PO TBEC
81.0000 mg | DELAYED_RELEASE_TABLET | Freq: Every day | ORAL | Status: DC
  Administered 2015-09-25 – 2015-10-02 (×8): 81 mg via ORAL
  Filled 2015-09-25 (×8): qty 1

## 2015-09-25 MED ORDER — VANCOMYCIN HCL IN DEXTROSE 1-5 GM/200ML-% IV SOLN
1000.0000 mg | Freq: Once | INTRAVENOUS | Status: AC
  Administered 2015-09-25: 1000 mg via INTRAVENOUS
  Filled 2015-09-25: qty 200

## 2015-09-25 MED ORDER — SULFAMETHOXAZOLE-TRIMETHOPRIM 800-160 MG PO TABS
1.0000 | ORAL_TABLET | Freq: Two times a day (BID) | ORAL | Status: DC
  Administered 2015-09-25 – 2015-09-27 (×6): 1 via ORAL
  Filled 2015-09-25 (×6): qty 1

## 2015-09-25 MED ORDER — ENOXAPARIN SODIUM 30 MG/0.3ML ~~LOC~~ SOLN
20.0000 mg | SUBCUTANEOUS | Status: DC
  Administered 2015-09-25 – 2015-09-27 (×3): 20 mg via SUBCUTANEOUS
  Filled 2015-09-25: qty 0.3
  Filled 2015-09-25 (×2): qty 0.2
  Filled 2015-09-25 (×3): qty 0.3

## 2015-09-25 MED ORDER — SIMVASTATIN 20 MG PO TABS
20.0000 mg | ORAL_TABLET | Freq: Every day | ORAL | Status: DC
  Administered 2015-09-27 – 2015-10-01 (×5): 20 mg via ORAL
  Filled 2015-09-25 (×7): qty 1

## 2015-09-25 MED ORDER — LORAZEPAM 2 MG/ML IJ SOLN
0.5000 mg | Freq: Once | INTRAMUSCULAR | Status: AC
Start: 2015-09-25 — End: 2015-09-25
  Administered 2015-09-25: 0.5 mg via INTRAVENOUS
  Filled 2015-09-25: qty 1

## 2015-09-25 MED ORDER — PREDNISONE 50 MG PO TABS
60.0000 mg | ORAL_TABLET | Freq: Every day | ORAL | Status: DC
  Administered 2015-09-25 – 2015-10-02 (×7): 60 mg via ORAL
  Filled 2015-09-25 (×7): qty 1

## 2015-09-25 MED ORDER — OXYCODONE HCL 5 MG PO TABS
5.0000 mg | ORAL_TABLET | ORAL | Status: DC | PRN
  Administered 2015-09-25 – 2015-10-02 (×22): 5 mg via ORAL
  Filled 2015-09-25 (×24): qty 1

## 2015-09-25 MED ORDER — COLLAGENASE 250 UNIT/GM EX OINT
TOPICAL_OINTMENT | Freq: Every day | CUTANEOUS | Status: DC
  Filled 2015-09-25: qty 30

## 2015-09-25 MED ORDER — KETOROLAC TROMETHAMINE 30 MG/ML IJ SOLN
30.0000 mg | Freq: Once | INTRAMUSCULAR | Status: AC
  Administered 2015-09-25: 30 mg via INTRAVENOUS
  Filled 2015-09-25: qty 1

## 2015-09-25 MED ORDER — HYDROMORPHONE HCL 1 MG/ML IJ SOLN
1.0000 mg | Freq: Once | INTRAMUSCULAR | Status: AC
  Administered 2015-09-25: 1 mg via INTRAVENOUS
  Filled 2015-09-25: qty 1

## 2015-09-25 NOTE — ED Notes (Signed)
Admitting MD at bedside.

## 2015-09-25 NOTE — ED Notes (Signed)
MD at bedside. 

## 2015-09-25 NOTE — Consult Note (Cosign Needed)
[name] MRN: [id number] DOB/AGE: August 29, 1961 54 y.o.  Admit date: 09/25/2015  Admission Diagnoses:  Active Problems:   Wound infection (HCC)   HPI: 54 year old female pt with a large wound in the back of her right calf.  She reports it has been there for over a month.    Past Medical History: Past Medical History  Diagnosis Date  . 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 Followed by Dr. Louanne Skye  . Hypertension   . Cocaine abuse     ongoing with resultant vaculitis.  . Depression   . Normocytic anemia     BL Hgb 9.8-12. Last anemia panel 04/2010 - showing Fe 19, ferritin 101.  Pt on monthly B12 injections  . 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 CAP (community acquired pneumonia) 03/2014 X 2  . Daily headache   . Migraines     "probably 5-6/yr" (04/21/2014)  . Rheumatoid arthritis(714.0)     patient reported  . Inflammatory arthritis [facility])     Surgical History: Past Surgical History  Procedure Laterality Date  . Skin biopsy Bilateral 04/2010    shin nodules  . Irrigation and debridement abscess Bilateral 09/26/2013    Procedure: DEBRIDEMENT ULCERS BILATERAL THIGHS;  Surgeon: Gwenyth Ober, MD;  Location: [facility];  Service: General;  Laterality: Bilateral;  . Hernia repair      "stomach"    Family History: Family History  Problem Relation Age of Onset  . Breast cancer Mother     Breast cancer  . Alcohol abuse Mother   . Colon cancer Maternal Aunt 28  . Alcohol abuse Father     Social History: Social History   Social History  . Marital Status: Single    Spouse Name: N/A  . Number of Children: 0  . Years of Education: 11th grade   Occupational History  . Disability     since 2011, due to her rheumatoid arthritis   Social History Main Topics  . Smoking status: Current Every Day Smoker -- 0.30 packs/day for 34 years    Types: Cigarettes  . Smokeless tobacco: Never Used     Comment: "Trying very  hard". / 4-5 CIGARETTES A DAY  . Alcohol Use: No  . Drug Use: Yes    Special: "Crack" cocaine, Cocaine     Comment: States she still uses crack "a little bit"., last used 09/23/15  . Sexual Activity: No   Other Topics Concern  . Not on file   Social History Narrative   Unemployed:  cleaning in past   Living at [place]; has Medicaid.   crack/cocaine use; pt denies IVDU   tobacco:  1/2 ppd, trying to quit   alcohol:  none        Allergies: Acetaminophen  Medications: I have reviewed the patient's current medications.  Vital Signs: Patient Vitals for the past 24 hrs:  BP Temp Temp src Pulse Resp SpO2  09/25/15 0901 (!) 146/77 mmHg 98.5 F (36.9 C) Oral 77 16 100 %  09/25/15 0500 144/89 mmHg - - 86 12 99 %  09/25/15 0415 146/86 mmHg - - 96 17 98 %  09/25/15 0400 168/91 mmHg - - 90 16 99 %  09/25/15 0330 163/100 mmHg - - 87 13 100 %  09/25/15 0321 - 98.3 F (36.8 C) Oral - - -  09/25/15 0315 (!) 180/107 mmHg - - 86  13 100 %  09/25/15 0157 (!) 159/114 mmHg 99 F (37.2 C) Oral 94 16 100 %  09/25/15 0152 - - - - - 99 %    Radiology: Dg Chest 2 View  09/25/2015  CLINICAL DATA:  54 year old female with chest pain EXAM: CHEST  2 VIEW COMPARISON:  Chest radiograph dated 09/07/2015 FINDINGS: The heart size and mediastinal contours are within normal limits. There is apparent lung tissue anterior to the mediastinal on the lateral projection, likely related to patient positioning. Both lungs are clear. The visualized skeletal structures are unremarkable. IMPRESSION: No active cardiopulmonary disease. Electronically Signed   By: Anner Crete M.D.   On: 09/25/2015 03:12   Dg Chest 2 View  09/07/2015  CLINICAL DATA:  Productive cough EXAM: CHEST  2 VIEW COMPARISON:  04/21/2015 chest radiograph. FINDINGS: Stable cardiomediastinal silhouette with normal heart size. No pneumothorax. No pleural effusion. Stable minimal scarring in the mid to upper right lung. No new focal lung  opacity. No pulmonary edema. IMPRESSION: No active cardiopulmonary disease. Electronically Signed   By: Ilona Sorrel M.D.   On: 09/07/2015 17:20   Dg Tibia/fibula Right  09/25/2015  CLINICAL DATA:  54 year old female with possible infection of the right lower extremity. EXAM: RIGHT TIBIA AND FIBULA - 2 VIEW COMPARISON:  None. FINDINGS: There is no acute fracture or dislocation. There is irregularity of the superficial soft tissues of the posterior calf with small pockets of subcutaneous appearing air. Clinical correlation is recommended. No radiopaque foreign object identified. IMPRESSION: Irregularity of the skin with small pockets of gas in the subcutaneous soft tissues of the posterior calf concerning for infection. Clinical correlation is recommended. Electronically Signed   By: Anner Crete M.D.   On: 09/25/2015 03:15    Labs:  Recent Labs  09/25/15 0205  WBC 4.7  RBC 3.63*  HCT 31.2*  PLT 309    Recent Labs  09/25/15 0205  NA 137  K 3.7  CL 104  CO2 24  BUN 14  CREATININE 0.73  GLUCOSE 96  CALCIUM 9.0    Recent Labs  09/25/15 0840  INR 1.18    Review of Systems: ROS  Physical Exam: Pt is thin and not well nourished She exhibits tenderness with unwrapping of her bandage to view the wound The would is 6  Inches in length, erythema and discharge is present.   Other wounds are covered by bandages   Assessment and Plan NPO Surgical Debriedment today with a wound vac placed Consult with I & D Recommend placement of PICC line for IV anx administration   Ronette Deter, PA-C [place] [phone]   Agree with above

## 2015-09-25 NOTE — ED Notes (Signed)
Per EMS:  Pt was picked up this evening from EMS at the [facility].  Pt stated she had been walking all day.  Pt recently had boils removed from her legs, and states her legs "stink" and she is worried she has an infection.  Pt also c/o chest pain x 2 hours, 8/10 pain, states it worsens when she coughs.

## 2015-09-25 NOTE — Consult Note (Signed)
WOC wound consult note Reason for Consult: levamisole vasculitis Patient well known to [facility] [place] from her previous admissions. She is covering with scarring from many, many vasculitic lesions that have healed. Wound type: Full thickness ulceration; LLE medial calf; 5.5cm x 2cm x 0.2cm - 100% clean, pink, evidence of re-epithelialiation.  Full thickness ulceration; RLE pretibial; 1.0cmx 1.5cm x 0.2cm-90% soft, black tissue Full thickness ulceration: RLE posterior calf; 10cm x 5cm x 0.5cm- 20% soft necrotic tissue at the proximal wound edge.  Pressure Ulcer POA: No Measurement: see above  Wound bed: see above Drainage (amount, consistency, odor) moderate to heavy mostly from the posterior calf wound, bloody drainage from the calf.   Periwound: intact, with scarring over her bilateral upper and lower extremities, dark purple areas over her face, scarring over her ears, nose and hands.  Dressing procedure/placement/frequency: Add enzymatic debridement ointment to the posterior right calf wound and right pretibial wound, cover with non adherent. Wrap with kerlix. Send enzymatic debridement ointment with patient at DC to continue to use on the posterior calf wound.  Do not feel patient needs surgical debridement at this time, and VAC therapy would not be indicated with the patient's current living situations and drug addiction issues.  Change daily.  Cover LLE wound with foam, change every 3 days.  Discussed POC with patient and bedside nurse.  Re consult if needed, will not follow at this time. Thanks  Bryar Dahms [place], [place] [phone])

## 2015-09-25 NOTE — Progress Notes (Signed)
Patient arrived on unit via stretcher. Patient alert and oriented x4. Patient oriented to unit, room, and staff. Patient placed on telemetry monitor, CCMD notified. Patient's skin assessment completed, check flowsheets. Patient's IV clean, dry and intact. Patient denies pain. Safety Fall Prevention Plan was given, discussed and signed by patient. Orders have been reviewed and implemented. Will continue to monitor the patient. Call light has been placed within reach and bed alarm has been activated.   Nena Polio BSN, RN  Phone Number: [phone]

## 2015-09-25 NOTE — ED Provider Notes (Signed)
CSN: [id number]     Arrival date & time 09/25/15  0147 History  By signing my name below, I, Irene Pap, attest that this documentation has been prepared under the direction and in the presence of Sarahanne Novakowski, MD. Electronically Signed: Irene Pap, ED Scribe. 09/25/2015. 3:05 AM.  Chief Complaint  Patient presents with  . Chest Pain  . Leg Pain  . Wound Check   Patient is a 54 y.o. female presenting with leg pain. The history is provided by the patient. No language interpreter was used.  Leg Pain Location:  Leg Injury: no   Leg location:  R lower leg Pain details:    Quality:  Aching   Severity:  Severe   Onset quality:  Gradual   Timing:  Constant   Progression:  Worsening Dislocation: no   Relieved by:  Nothing Worsened by:  Nothing tried Ineffective treatments:  None tried Associated symptoms: no fever   Risk factors: no concern for non-accidental trauma   HPI Comments: [name] is a 54 y.o. Female with a hx of vasculitis, CAP, HTN, and cocaine abuse brought in by EMS who presents to the Emergency Department complaining of sharp chest wall pain that she rates 8/10, right leg pain and a wound check onset 2 hours ago. Pt states that she was walking and smoking cigarettes when her pain started. Pt reports associated cough, congestion, and rhinorrhea. Per EMS, pt recently had boils removed from the right lower leg at [facility]. She is worried that she has an infection due to the odor coming from her leg. She has not taken anything for her symptoms and states that she was not given anti-biotics for her leg wounds. Pt denies use of cocaine today. She denies fever, chills, nausea, or vomiting. Pt is followed by Dr. Louanne Skye.   Past Medical History  Diagnosis Date  . Vasculitis ([place])     2/2 Levimasole toxicity. Followed by Dr. Louanne Skye  . Hypertension   . Cocaine abuse     ongoing with resultant vaculitis.  . Depression   . Normocytic anemia     BL Hgb 9.8-12. Last anemia  panel 04/2010 - showing Fe 19, ferritin 101.  Pt on monthly B12 injections  . 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 CAP (community acquired pneumonia) 03/2014 X 2  . Daily headache   . Migraines     "probably 5-6/yr" (04/21/2014)  . Rheumatoid arthritis(714.0)     patient reported  . Inflammatory arthritis [facility])    Past Surgical History  Procedure Laterality Date  . Skin biopsy Bilateral 04/2010    shin nodules  . Irrigation and debridement abscess Bilateral 09/26/2013    Procedure: DEBRIDEMENT ULCERS BILATERAL THIGHS;  Surgeon: Gwenyth Ober, MD;  Location: [place];  Service: General;  Laterality: Bilateral;  . Hernia repair      "stomach"   Family History  Problem Relation Age of Onset  . Breast cancer Mother     Breast cancer  . Alcohol abuse Mother   . Colon cancer Maternal Aunt 27  . Alcohol abuse Father    Social History  Substance Use Topics  . Smoking status: Current Every Day Smoker -- 0.30 packs/day for 34 years    Types: Cigarettes  . Smokeless tobacco: Never Used     Comment: "Trying very hard". / 4-5 CIGARETTES A DAY  . Alcohol Use: No   OB History    No  data available     Review of Systems  Constitutional: Negative for fever and chills.  HENT: Positive for congestion and rhinorrhea.   Respiratory: Positive for cough.   Cardiovascular: Positive for chest pain (chest wall pain).  Gastrointestinal: Negative for nausea and vomiting.  Musculoskeletal: Positive for arthralgias.  All other systems reviewed and are negative.  Allergies  Acetaminophen  Home Medications   Prior to Admission medications   Medication Sig Start Date End Date Taking? Authorizing Provider  acyclovir (ZOVIRAX) 800 MG tablet Take 1 tablet (800 mg total) by mouth 5 (five) times daily. Patient not taking: Reported on 08/13/2015 06/08/15   Debby Freiberg, MD  amoxicillin-clavulanate (AUGMENTIN) 875-125 MG per tablet Take 1 tablet by mouth 2 (two) times  daily. Patient not taking: Reported on 08/13/2015 06/17/15   Jule Ser, DO  aspirin EC 81 MG EC tablet Take 1 tablet (81 mg total) by mouth daily. Patient not taking: Reported on 04/21/2015 04/08/15   Milagros Loll, MD  benzonatate (TESSALON) 200 MG capsule Take 1 capsule (200 mg total) by mouth 3 (three) times daily as needed for cough. 09/07/15   Gustavus Bryant, MD  ibuprofen (ADVIL,MOTRIN) 600 MG tablet Take 1 tablet (600 mg total) by mouth every 6 (six) hours as needed for fever, headache, mild pain, moderate pain or cramping. 06/17/15   Jule Ser, DO  lisinopril (PRINIVIL,ZESTRIL) 10 MG tablet Take 1 tablet (10 mg total) by mouth daily. Patient not taking: Reported on 08/13/2015 03/11/15   Kelby Aline, MD  methocarbamol (ROBAXIN) 500 MG tablet Take 1 tablet (500 mg total) by mouth 2 (two) times daily. 08/13/15   Nona Dell, PA-C  oxyCODONE (ROXICODONE) 5 MG immediate release tablet Take 1 tablet (5 mg total) by mouth every 4 (four) hours as needed for severe pain. 08/13/15   Nona Dell, PA-C  silver sulfADIAZINE (SILVADENE) 1 % cream Apply topically 2 (two) times daily. 06/17/15   Jule Ser, DO  simvastatin (ZOCOR) 20 MG tablet Take 1 tablet (20 mg total) by mouth daily at 6 PM. Patient not taking: Reported on 05/23/2015 04/22/15   Milagros Loll, MD  sulfamethoxazole-trimethoprim (BACTRIM DS,SEPTRA DS) 800-160 MG per tablet Take 1 tablet by mouth 2 (two) times daily. Patient not taking: Reported on 08/13/2015 06/17/15   Jule Ser, DO   BP 159/114 mmHg  Pulse 94  Temp(Src) 99 F (37.2 C) (Oral)  Resp 16  SpO2 100% Physical Exam  Constitutional: She is oriented to person, place, and time. She appears well-developed and well-nourished.  HENT:  Head: Normocephalic and atraumatic.  Mouth/Throat: Oropharynx is clear and moist. No oropharyngeal exudate.  Eyes: EOM are normal. Pupils are equal, round, and reactive to light.  Neck: Normal range of motion.  Neck supple. No JVD present. Carotid bruit is not present.  Cardiovascular: Normal rate, regular rhythm and normal heart sounds.  Exam reveals no gallop and no friction rub.   No murmur heard. Pulmonary/Chest: Effort normal and breath sounds normal. No stridor. She has no wheezes. She has no rhonchi. She has no rales.  Abdominal: Soft. Bowel sounds are normal. She exhibits no mass. There is no tenderness. There is no rebound and no guarding.  Musculoskeletal: Normal range of motion. She exhibits tenderness.  Lymphadenopathy:    She has no cervical adenopathy.  Neurological: She is alert and oriented to person, place, and time. She has normal reflexes. She displays normal reflexes. No cranial nerve deficit. She exhibits normal muscle tone. Coordination  normal.  Skin: Skin is warm and dry.  Right leg: slightly larger than 1 cm circular lesion, posterior, just distal to the popliteal fossa. Scant bleeding, foul smelling  Psychiatric: She has a normal mood and affect. Her behavior is normal.  Nursing note and vitals reviewed.   ED Course  Procedures (including critical care time) DIAGNOSTIC STUDIES: Oxygen Saturation is 100% on RA, normal by my interpretation.    COORDINATION OF CARE: 2:10 AM-Discussed treatment plan which includes x-ray with pt at bedside and pt agreed to plan.   Labs Review Labs Reviewed  CBC - Abnormal; Notable for the following:    RBC 3.63 (*)    Hemoglobin 10.6 (*)    HCT 31.2 (*)    All other components within normal limits  BASIC METABOLIC PANEL  DIFFERENTIAL  CBC WITH DIFFERENTIAL/PLATELET  Randolm Idol, ED    Imaging Review Dg Chest 2 View  09/25/2015  CLINICAL DATA:  54 year old female with chest pain EXAM: CHEST  2 VIEW COMPARISON:  Chest radiograph dated 09/07/2015 FINDINGS: The heart size and mediastinal contours are within normal limits. There is apparent lung tissue anterior to the mediastinal on the lateral projection, likely related to patient  positioning. Both lungs are clear. The visualized skeletal structures are unremarkable. IMPRESSION: No active cardiopulmonary disease. Electronically Signed   By: Anner Crete M.D.   On: 09/25/2015 03:12   Dg Tibia/fibula Right  09/25/2015  CLINICAL DATA:  54 year old female with possible infection of the right lower extremity. EXAM: RIGHT TIBIA AND FIBULA - 2 VIEW COMPARISON:  None. FINDINGS: There is no acute fracture or dislocation. There is irregularity of the superficial soft tissues of the posterior calf with small pockets of subcutaneous appearing air. Clinical correlation is recommended. No radiopaque foreign object identified. IMPRESSION: Irregularity of the skin with small pockets of gas in the subcutaneous soft tissues of the posterior calf concerning for infection. Clinical correlation is recommended. Electronically Signed   By: Anner Crete M.D.   On: 09/25/2015 03:15   I have personally reviewed and evaluated these images and lab results as part of my medical decision-making.   EKG Interpretation None      MDM   Final diagnoses:  None   Results for orders placed or performed during the hospital encounter of 62/70/35  Basic metabolic panel  Result Value Ref Range   Sodium 137 135 - 145 mmol/L   Potassium 3.7 3.5 - 5.1 mmol/L   Chloride 104 101 - 111 mmol/L   CO2 24 22 - 32 mmol/L   Glucose, Bld 96 65 - 99 mg/dL   BUN 14 6 - 20 mg/dL   Creatinine, Ser 0.73 0.44 - 1.00 mg/dL   Calcium 9.0 8.9 - 10.3 mg/dL   GFR calc non Af Amer >60 >60 mL/min   GFR calc Af Amer >60 >60 mL/min   Anion gap 9 5 - 15  CBC  Result Value Ref Range   WBC 4.7 4.0 - 10.5 K/uL   RBC 3.63 (L) 3.87 - 5.11 MIL/uL   Hemoglobin 10.6 (L) 12.0 - 15.0 g/dL   HCT 31.2 (L) 36.0 - 46.0 %   MCV 86.0 78.0 - 100.0 fL   MCH 29.2 26.0 - 34.0 pg   MCHC 34.0 30.0 - 36.0 g/dL   RDW 13.7 11.5 - 15.5 %   Platelets 309 150 - 400 K/uL  Differential  Result Value Ref Range   Neutrophils Relative % 66 %    Neutro Abs 3.1 1.7 -  7.7 K/uL   Lymphocytes Relative 31 %   Lymphs Abs 1.5 0.7 - 4.0 K/uL   Monocytes Relative 2 %   Monocytes Absolute 0.1 0.1 - 1.0 K/uL   Eosinophils Relative 1 %   Eosinophils Absolute 0.1 0.0 - 0.7 K/uL   Basophils Relative 0 %   Basophils Absolute 0.0 0.0 - 0.1 K/uL  I-stat troponin, ED (not at [facility], [facility])  Result Value Ref Range   Troponin i, poc 0.01 0.00 - 0.08 ng/mL   Comment 3           Dg Chest 2 View  09/25/2015  CLINICAL DATA:  54 year old female with chest pain EXAM: CHEST  2 VIEW COMPARISON:  Chest radiograph dated 09/07/2015 FINDINGS: The heart size and mediastinal contours are within normal limits. There is apparent lung tissue anterior to the mediastinal on the lateral projection, likely related to patient positioning. Both lungs are clear. The visualized skeletal structures are unremarkable. IMPRESSION: No active cardiopulmonary disease. Electronically Signed   By: Anner Crete M.D.   On: 09/25/2015 03:12   Dg Chest 2 View  09/07/2015  CLINICAL DATA:  Productive cough EXAM: CHEST  2 VIEW COMPARISON:  04/21/2015 chest radiograph. FINDINGS: Stable cardiomediastinal silhouette with normal heart size. No pneumothorax. No pleural effusion. Stable minimal scarring in the mid to upper right lung. No new focal lung opacity. No pulmonary edema. IMPRESSION: No active cardiopulmonary disease. Electronically Signed   By: Ilona Sorrel M.D.   On: 09/07/2015 17:20   Dg Tibia/fibula Right  09/25/2015  CLINICAL DATA:  54 year old female with possible infection of the right lower extremity. EXAM: RIGHT TIBIA AND FIBULA - 2 VIEW COMPARISON:  None. FINDINGS: There is no acute fracture or dislocation. There is irregularity of the superficial soft tissues of the posterior calf with small pockets of subcutaneous appearing air. Clinical correlation is recommended. No radiopaque foreign object identified. IMPRESSION: Irregularity of the skin with small pockets of gas in the  subcutaneous soft tissues of the posterior calf concerning for infection. Clinical correlation is recommended. Electronically Signed   By: Anner Crete M.D.   On: 09/25/2015 03:15   Medications  oxyCODONE (Oxy IR/ROXICODONE) immediate release tablet 5 mg (not administered)  simvastatin (ZOCOR) tablet 20 mg (not administered)  enoxaparin (LOVENOX) injection 20 mg (not administered)  aspirin EC tablet 81 mg (not administered)  chlorhexidine (PERIDEX) 0.12 % solution 15 mL (not administered)  antiseptic oral rinse (CPC / CETYLPYRIDINIUM CHLORIDE 0.05%) solution 7 mL (not administered)  vancomycin (VANCOCIN) IVPB 1000 mg/200 mL premix (1,000 mg Intravenous Transfusing/Transfer 09/25/15 0504)  piperacillin-tazobactam (ZOSYN) IVPB 3.375 g (0 g Intravenous Stopped 09/25/15 0459)  ketorolac (TORADOL) 30 MG/ML injection 30 mg (30 mg Intravenous Given 09/25/15 0341)  HYDROmorphone (DILAUDID) injection 1 mg (1 mg Intravenous Given 09/25/15 0500)  LORazepam (ATIVAN) injection 0.5 mg (0.5 mg Intravenous Given 09/25/15 0630)    I personally performed the services described in this documentation, which was scribed in my presence. The recorded information has been reviewed and is accurate.      Veatrice Kells, MD 09/25/15 [phone]

## 2015-09-25 NOTE — Progress Notes (Signed)
Subjective: [name] is sleeping comfortably at bedside this morning. Complains of severe pain in her R calf wound with wound dressing changes. The wound has moderately foul odor with significant purulent drainage. No fevers, no other symptoms currently.  Objective: Vital signs in last 24 hours: Filed Vitals:   09/25/15 0400 09/25/15 0415 09/25/15 0500 09/25/15 0901  BP: 168/91 146/86 144/89 146/77  Pulse: 90 96 86 77  Temp:    98.5 F (36.9 C)  TempSrc:    Oral  Resp: 16 17 12 16   SpO2: 99% 98% 99% 100%   Weight change:   Intake/Output Summary (Last 24 hours) at 09/25/15 1001 Last data filed at 09/25/15 0902  Gross per 24 hour  Intake      0 ml  Output    200 ml  Net   -200 ml     Gen: Well-appearing, alert and oriented to person, place, and time HEENT: Oropharynx clear without erythema or exudate.  Neck: No cervical LAD, no thyromegaly or nodules, no JVD noted. CV: Normal rate, regular rhythm, no murmurs, rubs, or gallops Pulmonary: Normal effort, CTA bilaterally, no wheezing, rales, or rhonchi Abdominal: Soft, non-tender, non-distended, without rebound, guarding, or masses Extremities: Distal pulses 2+ in upper and lower extremities bilaterally, no tenderness, erythema or edema Skin: Several wounds in various stages of healing throughout. Primary wound of interest ~10cm in diameter over R calf, erythema around the rim with dusky margins. Copious purulent drainage from wound.  Lab Results: Basic Metabolic Panel:  Recent Labs Lab 09/25/15 0205 09/25/15 0840  NA 137  --   K 3.7  --   CL 104  --   CO2 24  --   GLUCOSE 96  --   BUN 14  --   CREATININE 0.73  --   CALCIUM 9.0  --   MG  --  1.9  PHOS  --  4.4   Liver Function Tests:  Recent Labs Lab 09/25/15 0840  AST 19  ALT 10*  ALKPHOS 60  BILITOT 0.5  PROT 6.2*  ALBUMIN 2.9*   CBC:  Recent Labs Lab 09/25/15 0205  WBC 4.7  NEUTROABS 3.1  HGB 10.6*  HCT 31.2*  MCV 86.0  PLT 309   Cardiac  Enzymes:  Recent Labs Lab 09/25/15 0840  TROPONINI <0.03   Coagulation:  Recent Labs Lab 09/25/15 0840  LABPROT 15.2  INR 1.18    Urinalysis:  Recent Labs Lab 09/25/15 0428  COLORURINE YELLOW  LABSPEC 1.008  PHURINE 7.5  GLUCOSEU NEGATIVE  HGBUR NEGATIVE  BILIRUBINUR NEGATIVE  KETONESUR NEGATIVE  PROTEINUR NEGATIVE  NITRITE NEGATIVE  LEUKOCYTESUR NEGATIVE   Studies/Results: Dg Chest 2 View  09/25/2015  CLINICAL DATA:  54 year old female with chest pain EXAM: CHEST  2 VIEW COMPARISON:  Chest radiograph dated 09/07/2015 FINDINGS: The heart size and mediastinal contours are within normal limits. There is apparent lung tissue anterior to the mediastinal on the lateral projection, likely related to patient positioning. Both lungs are clear. The visualized skeletal structures are unremarkable. IMPRESSION: No active cardiopulmonary disease. Electronically Signed   By: Anner Crete M.D.   On: 09/25/2015 03:12   Dg Tibia/fibula Right  09/25/2015  CLINICAL DATA:  54 year old female with possible infection of the right lower extremity. EXAM: RIGHT TIBIA AND FIBULA - 2 VIEW COMPARISON:  None. FINDINGS: There is no acute fracture or dislocation. There is irregularity of the superficial soft tissues of the posterior calf with small pockets of subcutaneous appearing air. Clinical  correlation is recommended. No radiopaque foreign object identified. IMPRESSION: Irregularity of the skin with small pockets of gas in the subcutaneous soft tissues of the posterior calf concerning for infection. Clinical correlation is recommended. Electronically Signed   By: Anner Crete M.D.   On: 09/25/2015 03:15   Assessment/Plan: Active Problems:   Wound infection ([name]) 1. Recurrent levamisole-induced vasculitis of RLE, with bacterial superinfection - patient has h/o recurrent hospitalizations for infection control and wound care, s/p debridement of bilateral thigh lesions by general surgery in  09/2013. She has recently relapsed with her longtime addiction with crack cocaine, previously had been abstinent for ~3 months. Likely has bacterial superinfection given purulence and odor. Given normal Xray and ESR, suspicion for osteomyelitis is low at this time. -F/u wound care recs -Consulted surgery for possible debridement; will consider re-consultation pending wound care recs -Start prednisone 21m for vasculitis -Given likely purulent infection without systemic s/s, will start on oral bactrim for MRSA, group A strep, GNR coverage -Oxycodone IR PRN for severe pain  Dispo: Disposition is deferred at this time, awaiting improvement of current medical problems.  Anticipated discharge in approximately 1-3 day(s).   The patient does have a current PCP (VZada Finders MD) and [facility].  The patient does have transportation limitations that hinder transportation to clinic appointments.  LOS: 0 days   WNorval Gable MD 09/25/2015, 10:01 AM

## 2015-09-25 NOTE — H&P (Signed)
Date: 09/25/2015               Patient Name:  [name] MRN: [id number]  DOB: 05-11-1961 Age / Sex: 54 y.o., female   PCP: Zada Finders, MD         Medical Service: Internal Medicine Teaching Service         Attending Physician: Dr. Axel Filler, MD    First Contact: Dr. Merrilyn Puma Pager: [phone]  Second Contact: Dr. 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   She says that she goes to wound care at [facility] and went there last week- they debrided it last week and put bandage around it. She did not remove the bandage until today when she noticed a smell coming from it. She says that the wound is draining blood, but no purulent discharge. She also noticed some swelling in the leg since last week. She complains that she was not given pain medicines or antibiotics at her last appointment last week. She denies fevers but endorses chills, no n/v/d/c. She complains of 10/10 pain. She has monthly appointments at [facility]. She was last seen in our clinic on the Nov 18th and per note, she does wound care at home and had silvadene cream and wound care supplies.   She says that she uses cocaine and smokes "a dime or two"- she last used it 2 days ago. No IV drug use. She also uses THC. She smokes 4 cigs per day since the age of 89. No alcohol use . She reports being sexually active with one partner occasionally.    Regarding chest pain, she reports it as sharp sometimes radiating to left arm, constantly present which started when she was walking from the bus  stand to the friend's house at around 11 PM.  Currently the pain subsided but it is still present. Sh e denies dizziness or lightheadedness.She denies palpitations.   Patient also reports folliculitis flare draining serosanguinous fluid in her axillae. Sometimes she will try to pinch the follicles to get them to pop. She also reports a "shingles" flare in her  both axilla which does not look shingles to me. She states she gets these flares every month, when her axilla becomes red, odorous, and the wounds drain yellow pus. She describes the pain as achy and itchy.  Her living situation is stable right now and she has a room which she shares with other room mates and she feels safe at home.    FH: alcohol abuse, breast cancer in mother, htn  SH: as described in HPI  Meds: Current Facility-Administered Medications  Medication Dose Route Frequency Provider Last Rate Last Dose  . vancomycin (VANCOCIN) IVPB 1000 mg/200 mL premix  1,000 mg Intravenous Once April Palumbo, MD 200 mL/hr at 09/25/15 0500 1,000 mg at 09/25/15 0500   Current Outpatient Prescriptions  Medication Sig Dispense Refill  . acyclovir (ZOVIRAX) 800 MG tablet Take 1 tablet (800 mg total) by mouth 5 (five) times daily. (Patient not taking: Reported  on 08/13/2015) 50 tablet 0  . amoxicillin-clavulanate (AUGMENTIN) 875-125 MG per tablet Take 1 tablet by mouth 2 (two) times daily. (Patient not taking: Reported on 08/13/2015) 10 tablet 0  . aspirin EC 81 MG EC tablet Take 1 tablet (81 mg total) by mouth daily. (Patient not taking: Reported on 04/21/2015) 30 tablet 0  . benzonatate (TESSALON) 200 MG capsule Take 1 capsule (200 mg total) by mouth 3 (three) times daily as needed for cough. (Patient not taking: Reported on 09/25/2015) 20 capsule 0  . ibuprofen (ADVIL,MOTRIN) 600 MG tablet Take 1 tablet (600 mg total) by mouth every 6 (six) hours as needed for fever, headache, mild pain, moderate pain or cramping. (Patient not taking:  Reported on 09/25/2015) 30 tablet 0  . lisinopril (PRINIVIL,ZESTRIL) 10 MG tablet Take 1 tablet (10 mg total) by mouth daily. (Patient not taking: Reported on 08/13/2015) 30 tablet 0  . methocarbamol (ROBAXIN) 500 MG tablet Take 1 tablet (500 mg total) by mouth 2 (two) times daily. (Patient not taking: Reported on 09/25/2015) 20 tablet 0  . oxyCODONE (ROXICODONE) 5 MG immediate release tablet Take 1 tablet (5 mg total) by mouth every 4 (four) hours as needed for severe pain. (Patient not taking: Reported on 09/25/2015) 10 tablet 0  . silver sulfADIAZINE (SILVADENE) 1 % cream Apply topically 2 (two) times daily. (Patient not taking: Reported on 09/25/2015) 50 g 0  . simvastatin (ZOCOR) 20 MG tablet Take 1 tablet (20 mg total) by mouth daily at 6 PM. (Patient not taking: Reported on 05/23/2015) 30 tablet 0  . sulfamethoxazole-trimethoprim (BACTRIM DS,SEPTRA DS) 800-160 MG per tablet Take 1 tablet by mouth 2 (two) times daily. (Patient not taking: Reported on 08/13/2015) 10 tablet 0    Allergies: Allergies as of 09/25/2015 - Review Complete 09/25/2015  Allergen Reaction Noted  . Acetaminophen Swelling    Past Medical History  Diagnosis Date  . Vasculitis ([place])     2/2 Levimasole toxicity. Followed by Dr. Louanne Skye  . Hypertension   . Cocaine abuse     ongoing with resultant vaculitis.  . Depression   . Normocytic anemia     BL Hgb 9.8-12. Last anemia panel 04/2010 - showing Fe 19, ferritin 101.  Pt on monthly B12 injections  . 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 CAP (community acquired pneumonia) 03/2014 X 2  . Daily headache   . Migraines     "probably 5-6/yr" (04/21/2014)  . Rheumatoid arthritis(714.0)     patient reported  . Inflammatory arthritis [facility])    Past Surgical History  Procedure Laterality Date  . Skin biopsy Bilateral 04/2010    shin nodules  . Irrigation and debridement abscess Bilateral 09/26/2013    Procedure: DEBRIDEMENT ULCERS BILATERAL  THIGHS;  Surgeon: Gwenyth Ober, MD;  Location: [place];  Service: General;  Laterality: Bilateral;  . Hernia repair      "stomach"   Family History  Problem Relation Age of Onset  . Breast cancer Mother     Breast cancer  . Alcohol abuse Mother   . Colon cancer Maternal Aunt 39  . Alcohol abuse Father    Social History   Social History  . Marital Status: Single    Spouse Name: N/A  . Number of Children: 0  . Years of Education: 11th grade   Occupational History  . Disability     since 2011, due to her rheumatoid arthritis   Social History  Main Topics  . Smoking status: Current Every Day Smoker -- 0.30 packs/day for 34 years    Types: Cigarettes  . Smokeless tobacco: Never Used     Comment: "Trying very hard". / 4-5 CIGARETTES A DAY  . Alcohol Use: No  . Drug Use: Yes    Special: "Crack" cocaine, Cocaine     Comment: States she still uses crack "a little bit"., last used 09/23/15  . Sexual Activity: No   Other Topics Concern  . Not on file   Social History Narrative   Unemployed:  cleaning in past   Living at [place]; has Medicaid.   crack/cocaine use; pt denies IVDU   tobacco:  1/2 ppd, trying to quit   alcohol:  none        Review of Systems: A complete 10 point ROS was obtained and is negative except as noted in HPI  Physical Exam: Blood pressure 146/86, pulse 96, temperature 98.3 F (36.8 C), temperature source Oral, resp. rate 17, SpO2 98 %.  General: A&O, inconsolably crying and ind istress Heent: ncat, mmm Neck: supple, midline trachea, CV: RRR, normal s1, s2, no m/r/g,  Resp: equal and symmetric breath sounds,ctab Abdomen: soft, nontender, nondistended, +BS in all 4 quadrants,  Skin: right leg wound with some surrounding erythema - see picture   Many previously healed wounds all over the body   Extremities: pulses intact b/     Lab results: Results for orders placed or performed during the hospital encounter of 09/25/15 (from the  past 24 hour(s))  Basic metabolic panel     Status: None   Collection Time: 09/25/15  2:05 AM  Result Value Ref Range   Sodium 137 135 - 145 mmol/L   Potassium 3.7 3.5 - 5.1 mmol/L   Chloride 104 101 - 111 mmol/L   CO2 24 22 - 32 mmol/L   Glucose, Bld 96 65 - 99 mg/dL   BUN 14 6 - 20 mg/dL   Creatinine, Ser 0.73 0.44 - 1.00 mg/dL   Calcium 9.0 8.9 - 10.3 mg/dL   GFR calc non Af Amer >60 >60 mL/min   GFR calc Af Amer >60 >60 mL/min   Anion gap 9 5 - 15  CBC     Status: Abnormal   Collection Time: 09/25/15  2:05 AM  Result Value Ref Range   WBC 4.7 4.0 - 10.5 K/uL   RBC 3.63 (L) 3.87 - 5.11 MIL/uL   Hemoglobin 10.6 (L) 12.0 - 15.0 g/dL   HCT 31.2 (L) 36.0 - 46.0 %   MCV 86.0 78.0 - 100.0 fL   MCH 29.2 26.0 - 34.0 pg   MCHC 34.0 30.0 - 36.0 g/dL   RDW 13.7 11.5 - 15.5 %   Platelets 309 150 - 400 K/uL  Differential     Status: None   Collection Time: 09/25/15  2:05 AM  Result Value Ref Range   Neutrophils Relative % 66 %   Neutro Abs 3.1 1.7 - 7.7 K/uL   Lymphocytes Relative 31 %   Lymphs Abs 1.5 0.7 - 4.0 K/uL   Monocytes Relative 2 %   Monocytes Absolute 0.1 0.1 - 1.0 K/uL   Eosinophils Relative 1 %   Eosinophils Absolute 0.1 0.0 - 0.7 K/uL   Basophils Relative 0 %   Basophils Absolute 0.0 0.0 - 0.1 K/uL  I-stat troponin, ED (not at [facility])     Status: None   Collection Time: 09/25/15  2:09 AM  Result  Value Ref Range   Troponin i, poc 0.01 0.00 - 0.08 ng/mL   Comment 3          Urinalysis, Routine w reflex microscopic (not at [facility])     Status: Abnormal   Collection Time: 09/25/15  4:28 AM  Result Value Ref Range   Color, Urine YELLOW YELLOW   APPearance CLOUDY (A) CLEAR   Specific Gravity, Urine 1.008 1.005 - 1.030   pH 7.5 5.0 - 8.0   Glucose, UA NEGATIVE NEGATIVE mg/dL   Hgb urine dipstick NEGATIVE NEGATIVE   Bilirubin Urine NEGATIVE NEGATIVE   Ketones, ur NEGATIVE NEGATIVE mg/dL   Protein, ur NEGATIVE NEGATIVE mg/dL   Nitrite NEGATIVE NEGATIVE    Leukocytes, UA NEGATIVE NEGATIVE     Imaging results:  Dg Chest 2 View  09/25/2015  CLINICAL DATA:  54 year old female with chest pain EXAM: CHEST  2 VIEW COMPARISON:  Chest radiograph dated 09/07/2015 FINDINGS: The heart size and mediastinal contours are within normal limits. There is apparent lung tissue anterior to the mediastinal on the lateral projection, likely related to patient positioning. Both lungs are clear. The visualized skeletal structures are unremarkable. IMPRESSION: No active cardiopulmonary disease. Electronically Signed   By: Anner Crete M.D.   On: 09/25/2015 03:12   Dg Tibia/fibula Right  09/25/2015  CLINICAL DATA:  54 year old female with possible infection of the right lower extremity. EXAM: RIGHT TIBIA AND FIBULA - 2 VIEW COMPARISON:  None. FINDINGS: There is no acute fracture or dislocation. There is irregularity of the superficial soft tissues of the posterior calf with small pockets of subcutaneous appearing air. Clinical correlation is recommended. No radiopaque foreign object identified. IMPRESSION: Irregularity of the skin with small pockets of gas in the subcutaneous soft tissues of the posterior calf concerning for infection. Clinical correlation is recommended. Electronically Signed   By: Anner Crete M.D.   On: 09/25/2015 03:15    Other results: EKG: LVH  Assessment & Plan by Problem: Active Problems:   Wound infection ([place])  Levimasole induced vasculitis: Patient with one of her many admissions for levimasole induced vasculitis, here for pain , now due to RIGHT leg wound which she says she noticed some smell today. She went to have the wound drained last week at [facility] and after that  There was a bandage present for 1 week which was not removed for a week. She continues to use cocaine despite knowing the side effects from it  And that is a major risk factor for further progression of levimasole induced vasculitis. She has no white count but her white count  has been low in the past. Her vitals are stable and she is afebrile. She is chronically anaemic with a normal MCV, and has normal platelets. BMP unremarkable. Tib fib xray of right shows skin irregularity of the skin with small pockets of gas concerning for infection. Previously back in August, osteomyelitis was ruled out on her LEFT lower extremity (but NOT right) by doing an MRI. She also has a chronically elevated ESR and CRP. She needed to folow up with a rheumatologist but I doubt they will take her given her extensive pain medicine history and noncompliance with physician instructions to stop the cocaine use to aid the healing of her wounds.   -consult wound care -she will need to go home on steroids again-she has not been able to follow with rheumatology even when scheduling an appointment  -given vanc and zosyn in the ER, do not see the need  for further antibiotics  -wound cultures obtained- f/u  -ordered ESR and CRP -PT and INR  -order LFTs, Mg and phos  -UDS -repeat HIV -given toradol and dilaudid for pain -oxycodone 5 mg every 4 hours       Chest pain, rule out ACS : Pt with 4 hour history of sharp radiating chest pain to the left lower arm which was started when she was walking to the bus stand this night. Her pain has decreased when we examined her after she received pain medicines. Chest pain was not reproducible on palpation.  Istat troponin is zero. EKG without ischemic changes but does show LVH. Her continued cocaine use is a risk factor for her anginal symptoms.   -trending troponins  HLD: on zocor   Dvt ppx: lovenox  Code- full   Dispo: Disposition is deferred at this time, awaiting improvement of current medical problems. Anticipated discharge in approximately 2 day(s).   The patient does have a current PCP (Zada Finders, MD) and does need an [facility] hospital follow-up appointment after discharge.  The patient does not have transportation limitations that hinder  transportation to clinic appointments.  Signed: Burgess Estelle, MD 09/25/2015, 5:01 AM

## 2015-09-25 NOTE — ED Notes (Signed)
Phlebotomy at bedside.

## 2015-09-26 ENCOUNTER — Other Ambulatory Visit: Payer: Self-pay | Admitting: Plastic Surgery

## 2015-09-26 ENCOUNTER — Encounter (HOSPITAL_COMMUNITY)
Admission: EM | Disposition: A | Discharge status: Skilled Nursing Facility | Payer: Self-pay | Source: Home / Self Care | Attending: Student in an Organized Health Care Education/Training Program

## 2015-09-26 ENCOUNTER — Inpatient Hospital Stay (HOSPITAL_COMMUNITY): Payer: Medicaid Other | Admitting: Certified Registered Nurse Anesthetist

## 2015-09-26 ENCOUNTER — Encounter (HOSPITAL_COMMUNITY): Payer: Self-pay | Admitting: Plastic Surgery

## 2015-09-26 DIAGNOSIS — L089 Local infection of the skin and subcutaneous tissue, unspecified: Secondary | ICD-10-CM | POA: Insufficient documentation

## 2015-09-26 DIAGNOSIS — S81801A Unspecified open wound, right lower leg, initial encounter: Secondary | ICD-10-CM

## 2015-09-26 DIAGNOSIS — T148XXA Other injury of unspecified body region, initial encounter: Secondary | ICD-10-CM

## 2015-09-26 DIAGNOSIS — I776 Arteritis, unspecified: Secondary | ICD-10-CM

## 2015-09-26 HISTORY — PX: I & D EXTREMITY: SHX5045

## 2015-09-26 LAB — SURGICAL PCR SCREEN
MRSA, PCR: POSITIVE — AB
Staphylococcus aureus: POSITIVE — AB

## 2015-09-26 SURGERY — IRRIGATION AND DEBRIDEMENT EXTREMITY
Anesthesia: General | Site: Leg Lower | Laterality: Right

## 2015-09-26 MED ORDER — SODIUM CHLORIDE 0.9 % IR SOLN
Status: DC | PRN
  Administered 2015-09-26: 1000 mL

## 2015-09-26 MED ORDER — FENTANYL CITRATE (PF) 100 MCG/2ML IJ SOLN: 25.0000 ug | INTRAMUSCULAR | Status: DC | PRN

## 2015-09-26 MED ORDER — FENTANYL CITRATE (PF) 250 MCG/5ML IJ SOLN
INTRAMUSCULAR | Status: AC
  Filled 2015-09-26: qty 5

## 2015-09-26 MED ORDER — PROMETHAZINE HCL 25 MG/ML IJ SOLN: 6.2500 mg | INTRAMUSCULAR | Status: DC | PRN

## 2015-09-26 MED ORDER — MIDAZOLAM HCL 2 MG/2ML IJ SOLN
INTRAMUSCULAR | Status: AC
  Filled 2015-09-26: qty 2

## 2015-09-26 MED ORDER — PROPOFOL 10 MG/ML IV BOLUS
INTRAVENOUS | Status: AC
  Filled 2015-09-26: qty 40

## 2015-09-26 MED ORDER — FENTANYL CITRATE (PF) 100 MCG/2ML IJ SOLN
INTRAMUSCULAR | Status: DC | PRN
  Administered 2015-09-26 (×4): 25 ug via INTRAVENOUS

## 2015-09-26 MED ORDER — ONDANSETRON HCL 4 MG/2ML IJ SOLN
INTRAMUSCULAR | Status: DC | PRN
  Administered 2015-09-26: 4 mg via INTRAVENOUS

## 2015-09-26 MED ORDER — MEPERIDINE HCL 25 MG/ML IJ SOLN: 6.2500 mg | INTRAMUSCULAR | Status: DC | PRN

## 2015-09-26 MED ORDER — SODIUM CHLORIDE 0.9 % IR SOLN
Status: DC | PRN
  Administered 2015-09-26: 500 mL

## 2015-09-26 MED ORDER — LACTATED RINGERS IV SOLN
INTRAVENOUS | Status: DC | PRN
  Administered 2015-09-26: 13:00:00 via INTRAVENOUS

## 2015-09-26 MED ORDER — LACTATED RINGERS IV SOLN
INTRAVENOUS | Status: DC
  Administered 2015-09-26: 13:00:00 via INTRAVENOUS

## 2015-09-26 MED ORDER — MIDAZOLAM HCL 2 MG/2ML IJ SOLN: 0.5000 mg | Freq: Once | INTRAMUSCULAR | Status: DC | PRN

## 2015-09-26 MED ORDER — CEFAZOLIN SODIUM-DEXTROSE 2-3 GM-% IV SOLR: 2.0000 g | INTRAVENOUS | Status: DC

## 2015-09-26 MED ORDER — CEFAZOLIN SODIUM-DEXTROSE 2-3 GM-% IV SOLR
INTRAVENOUS | Status: DC | PRN
  Administered 2015-09-26: 2 g via INTRAVENOUS

## 2015-09-26 MED ORDER — KETOROLAC TROMETHAMINE 15 MG/ML IJ SOLN
15.0000 mg | Freq: Once | INTRAMUSCULAR | Status: AC
  Administered 2015-09-26: 15 mg via INTRAVENOUS
  Filled 2015-09-26: qty 1

## 2015-09-26 MED ORDER — MIDAZOLAM HCL 5 MG/5ML IJ SOLN
INTRAMUSCULAR | Status: DC | PRN
  Administered 2015-09-26: 2 mg via INTRAVENOUS

## 2015-09-26 MED ORDER — LIDOCAINE HCL (CARDIAC) 20 MG/ML IV SOLN
INTRAVENOUS | Status: DC | PRN
  Administered 2015-09-26: 40 mg via INTRAVENOUS

## 2015-09-26 MED ORDER — PROPOFOL 10 MG/ML IV BOLUS
INTRAVENOUS | Status: DC | PRN
  Administered 2015-09-26: 50 mg via INTRAVENOUS
  Administered 2015-09-26: 150 mg via INTRAVENOUS

## 2015-09-26 SURGICAL SUPPLY — 40 items
BANDAGE ELASTIC 4 VELCRO ST LF (GAUZE/BANDAGES/DRESSINGS) ×2 IMPLANT
BLADE SURG ROTATE 9660 (MISCELLANEOUS) IMPLANT
BNDG GAUZE ELAST 4 BULKY (GAUZE/BANDAGES/DRESSINGS) ×2 IMPLANT
CANISTER SUCTION 2500CC (MISCELLANEOUS) ×3 IMPLANT
CHLORAPREP W/TINT 26ML (MISCELLANEOUS) IMPLANT
COVER SURGICAL LIGHT HANDLE (MISCELLANEOUS) ×3 IMPLANT
DRAPE EXTREMITY T 121X128X90 (DRAPE) IMPLANT
DRAPE INCISE IOBAN 66X45 STRL (DRAPES) ×2 IMPLANT
DRAPE ORTHO SPLIT 77X108 STRL (DRAPES)
DRAPE SURG ORHT 6 SPLT 77X108 (DRAPES) IMPLANT
DRSG ADAPTIC 3X8 NADH LF (GAUZE/BANDAGES/DRESSINGS) ×2 IMPLANT
DRSG PAD ABDOMINAL 8X10 ST (GAUZE/BANDAGES/DRESSINGS) IMPLANT
DRSG VAC ATS LRG SENSATRAC (GAUZE/BANDAGES/DRESSINGS) IMPLANT
DRSG VAC ATS MED SENSATRAC (GAUZE/BANDAGES/DRESSINGS) ×2 IMPLANT
DRSG VAC ATS SM SENSATRAC (GAUZE/BANDAGES/DRESSINGS) IMPLANT
ELECT REM PT RETURN 9FT ADLT (ELECTROSURGICAL) ×3
ELECTRODE REM PT RTRN 9FT ADLT (ELECTROSURGICAL) ×1 IMPLANT
GAUZE SPONGE 4X4 12PLY STRL (GAUZE/BANDAGES/DRESSINGS) IMPLANT
GEL ULTRASOUND 20GR AQUASONIC (MISCELLANEOUS) IMPLANT
GLOVE BIO SURGEON STRL SZ 6.5 (GLOVE) ×4 IMPLANT
GLOVE BIO SURGEONS STRL SZ 6.5 (GLOVE) ×2
GOWN STRL REUS W/ TWL LRG LVL3 (GOWN DISPOSABLE) ×2 IMPLANT
GOWN STRL REUS W/TWL LRG LVL3 (GOWN DISPOSABLE) ×6
HANDPIECE INTERPULSE COAX TIP (DISPOSABLE)
KIT BASIN OR (CUSTOM PROCEDURE TRAY) ×3 IMPLANT
KIT ROOM TURNOVER OR (KITS) ×3 IMPLANT
MATRIX SURGICAL PSM 5X5CM (Tissue) ×4 IMPLANT
MICROMATRIX 1000MG (Tissue) ×3 IMPLANT
NS IRRIG 1000ML POUR BTL (IV SOLUTION) ×3 IMPLANT
PACK GENERAL/GYN (CUSTOM PROCEDURE TRAY) ×3 IMPLANT
PAD ARMBOARD 7.5X6 YLW CONV (MISCELLANEOUS) ×6 IMPLANT
PAD NEG PRESSURE SENSATRAC (MISCELLANEOUS) IMPLANT
SET HNDPC FAN SPRY TIP SCT (DISPOSABLE) IMPLANT
SOLUTION PARTIC MCRMTRX 1000MG (Tissue) IMPLANT
SUT SILK 4 0 P 3 (SUTURE) ×2 IMPLANT
SUT VIC AB 5-0 PS2 18 (SUTURE) ×4 IMPLANT
TOWEL OR 17X24 6PK STRL BLUE (TOWEL DISPOSABLE) IMPLANT
TOWEL OR 17X26 10 PK STRL BLUE (TOWEL DISPOSABLE) ×3 IMPLANT
UNDERPAD 30X30 INCONTINENT (UNDERPADS AND DIAPERS) ×3 IMPLANT
WATER STERILE IRR 1000ML POUR (IV SOLUTION) IMPLANT

## 2015-09-26 NOTE — Progress Notes (Signed)
Patient arrived on unit via bed from PACU.  Wound vac in place on right lower extremity at 125 mmHg.

## 2015-09-26 NOTE — Progress Notes (Signed)
Pt arrived to pacu with wound vac to right calf at 125 continous

## 2015-09-26 NOTE — H&P (View-Only) (Signed)
Reason for Consult: right leg ulcer Referring Physician:  [name] is an 54 y.o. female.  HPI: The patient is a 54 yrs old bf here for a right leg wound.  She has multiple medical problems including known vascular disease, drug use and hypertension.  She has had the wound for several months and it is not improving.  It is unclear what has been done to care for the wound.  The peri-wound area is red and the wound is not clear with no granulation tissue.  Nothing makes it better at this point and it has gotten worse over the past week.  Past Medical History  Diagnosis Date  . Vasculitis ([place])     2/2 Levimasole toxicity. Followed by Dr. Louanne Skye  . Hypertension   . Cocaine abuse     ongoing with resultant vaculitis.  . Depression   . Normocytic anemia     BL Hgb 9.8-12. Last anemia panel 04/2010 - showing Fe 19, ferritin 101.  Pt on monthly B12 injections  . 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 CAP (community acquired pneumonia) 03/2014 X 2  . Daily headache   . Migraines     "probably 5-6/yr" (04/21/2014)  . Rheumatoid arthritis(714.0)     patient reported  . Inflammatory arthritis [facility])     Past Surgical History  Procedure Laterality Date  . Skin biopsy Bilateral 04/2010    shin nodules  . Irrigation and debridement abscess Bilateral 09/26/2013    Procedure: DEBRIDEMENT ULCERS BILATERAL THIGHS;  Surgeon: Gwenyth Ober, MD;  Location: [place];  Service: General;  Laterality: Bilateral;  . Hernia repair      "stomach"    Family History  Problem Relation Age of Onset  . Breast cancer Mother     Breast cancer  . Alcohol abuse Mother   . Colon cancer Maternal Aunt 69  . Alcohol abuse Father     Social History:  reports that she has been smoking Cigarettes.  She has a 10.2 pack-year smoking history. She has never used smokeless tobacco. She reports that she uses illicit drugs ("Crack" cocaine and Cocaine). She reports that she does  not drink alcohol.  Allergies:  Allergies  Allergen Reactions  . Acetaminophen Swelling    REACTION: eyelid swelling    Medications: I have reviewed the patient's current medications.  Results for orders placed or performed during the hospital encounter of 09/25/15 (from the past 48 hour(s))  Basic metabolic panel     Status: None   Collection Time: 09/25/15  2:05 AM  Result Value Ref Range   Sodium 137 135 - 145 mmol/L   Potassium 3.7 3.5 - 5.1 mmol/L   Chloride 104 101 - 111 mmol/L   CO2 24 22 - 32 mmol/L   Glucose, Bld 96 65 - 99 mg/dL   BUN 14 6 - 20 mg/dL   Creatinine, Ser 0.73 0.44 - 1.00 mg/dL   Calcium 9.0 8.9 - 10.3 mg/dL   GFR calc non Af Amer >60 >60 mL/min   GFR calc Af Amer >60 >60 mL/min    Comment: (NOTE) The eGFR has been calculated using the CKD EPI equation. This calculation has not been validated in all clinical situations. eGFR's persistently <60 mL/min signify possible Chronic Kidney Disease.    Anion gap 9 5 - 15  CBC     Status: Abnormal   Collection Time: 09/25/15  2:05 AM  Result Value Ref Range   WBC 4.7 4.0 - 10.5 K/uL   RBC 3.63 (L) 3.87 - 5.11 MIL/uL   Hemoglobin 10.6 (L) 12.0 - 15.0 g/dL   HCT 31.2 (L) 36.0 - 46.0 %   MCV 86.0 78.0 - 100.0 fL   MCH 29.2 26.0 - 34.0 pg   MCHC 34.0 30.0 - 36.0 g/dL   RDW 13.7 11.5 - 15.5 %   Platelets 309 150 - 400 K/uL  Differential     Status: None   Collection Time: 09/25/15  2:05 AM  Result Value Ref Range   Neutrophils Relative % 66 %   Neutro Abs 3.1 1.7 - 7.7 K/uL   Lymphocytes Relative 31 %   Lymphs Abs 1.5 0.7 - 4.0 K/uL   Monocytes Relative 2 %   Monocytes Absolute 0.1 0.1 - 1.0 K/uL   Eosinophils Relative 1 %   Eosinophils Absolute 0.1 0.0 - 0.7 K/uL   Basophils Relative 0 %   Basophils Absolute 0.0 0.0 - 0.1 K/uL  I-stat troponin, ED (not at [facility])     Status: None   Collection Time: 09/25/15  2:09 AM  Result Value Ref Range   Troponin i, poc 0.01 0.00 - 0.08 ng/mL   Comment 3             Comment: Due to the release kinetics of cTnI, a negative result within the first hours of the onset of symptoms does not rule out myocardial infarction with certainty. If myocardial infarction is still suspected, repeat the test at appropriate intervals.   Rapid HIV screen (HIV 1/2 Ab+Ag)     Status: None   Collection Time: 09/25/15  4:24 AM  Result Value Ref Range   HIV-1 P24 Antigen - HIV24 NON REACTIVE NON REACTIVE   HIV 1/2 Antibodies NON REACTIVE NON REACTIVE   Interpretation (HIV Ag Ab)      A non reactive test result means that HIV 1 or HIV 2 antibodies and HIV 1 p24 antigen were not detected in the specimen.  Urinalysis, Routine w reflex microscopic (not at [facility])     Status: Abnormal   Collection Time: 09/25/15  4:28 AM  Result Value Ref Range   Color, Urine YELLOW YELLOW   APPearance CLOUDY (A) CLEAR   Specific Gravity, Urine 1.008 1.005 - 1.030   pH 7.5 5.0 - 8.0   Glucose, UA NEGATIVE NEGATIVE mg/dL   Hgb urine dipstick NEGATIVE NEGATIVE   Bilirubin Urine NEGATIVE NEGATIVE   Ketones, ur NEGATIVE NEGATIVE mg/dL   Protein, ur NEGATIVE NEGATIVE mg/dL   Nitrite NEGATIVE NEGATIVE   Leukocytes, UA NEGATIVE NEGATIVE    Comment: MICROSCOPIC NOT DONE ON URINES WITH NEGATIVE PROTEIN, BLOOD, LEUKOCYTES, NITRITE, OR GLUCOSE <1000 mg/dL.  Urine rapid drug screen (hosp performed)     Status: Abnormal   Collection Time: 09/25/15  4:28 AM  Result Value Ref Range   Opiates NONE DETECTED NONE DETECTED   Cocaine POSITIVE (A) NONE DETECTED   Benzodiazepines NONE DETECTED NONE DETECTED   Amphetamines NONE DETECTED NONE DETECTED   Tetrahydrocannabinol NONE DETECTED NONE DETECTED   Barbiturates NONE DETECTED NONE DETECTED    Comment:        DRUG SCREEN FOR MEDICAL PURPOSES ONLY.  IF CONFIRMATION IS NEEDED FOR ANY PURPOSE, NOTIFY LAB WITHIN 5 DAYS.        LOWEST DETECTABLE LIMITS FOR URINE DRUG SCREEN Drug Class       Cutoff (ng/mL) Amphetamine      1000  Barbiturate       200 Benzodiazepine   433 Tricyclics       295 Opiates          300 Cocaine          300 THC              50   Wound culture     Status: None (Preliminary result)   Collection Time: 09/25/15  5:03 AM  Result Value Ref Range   Specimen Description WOUND RIGHT LEG    Special Requests NONE    Gram Stain      MODERATE WBC PRESENT,BOTH PMN AND MONONUCLEAR NO SQUAMOUS EPITHELIAL CELLS SEEN MODERATE GRAM POSITIVE COCCI IN PAIRS Performed at [facility]    Culture PENDING    Report Status PENDING   Troponin I     Status: None   Collection Time: 09/25/15  8:40 AM  Result Value Ref Range   Troponin I <0.03 <0.031 ng/mL    Comment:        NO INDICATION OF MYOCARDIAL INJURY.   Phosphorus     Status: None   Collection Time: 09/25/15  8:40 AM  Result Value Ref Range   Phosphorus 4.4 2.5 - 4.6 mg/dL  Hepatic function panel     Status: Abnormal   Collection Time: 09/25/15  8:40 AM  Result Value Ref Range   Total Protein 6.2 (L) 6.5 - 8.1 g/dL   Albumin 2.9 (L) 3.5 - 5.0 g/dL   AST 19 15 - 41 U/L   ALT 10 (L) 14 - 54 U/L   Alkaline Phosphatase 60 38 - 126 U/L   Total Bilirubin 0.5 0.3 - 1.2 mg/dL   Bilirubin, Direct <0.1 (L) 0.1 - 0.5 mg/dL   Indirect Bilirubin NOT CALCULATED 0.3 - 0.9 mg/dL  Protime-INR     Status: None   Collection Time: 09/25/15  8:40 AM  Result Value Ref Range   Prothrombin Time 15.2 11.6 - 15.2 seconds   INR 1.18 0.00 - 1.49  Magnesium     Status: None   Collection Time: 09/25/15  8:40 AM  Result Value Ref Range   Magnesium 1.9 1.7 - 2.4 mg/dL    Dg Chest 2 View  09/25/2015  CLINICAL DATA:  54 year old female with chest pain EXAM: CHEST  2 VIEW COMPARISON:  Chest radiograph dated 09/07/2015 FINDINGS: The heart size and mediastinal contours are within normal limits. There is apparent lung tissue anterior to the mediastinal on the lateral projection, likely related to patient positioning. Both lungs are clear. The visualized skeletal structures are  unremarkable. IMPRESSION: No active cardiopulmonary disease. Electronically Signed   By: Anner Crete M.D.   On: 09/25/2015 03:12   Dg Tibia/fibula Right  09/25/2015  CLINICAL DATA:  54 year old female with possible infection of the right lower extremity. EXAM: RIGHT TIBIA AND FIBULA - 2 VIEW COMPARISON:  None. FINDINGS: There is no acute fracture or dislocation. There is irregularity of the superficial soft tissues of the posterior calf with small pockets of subcutaneous appearing air. Clinical correlation is recommended. No radiopaque foreign object identified. IMPRESSION: Irregularity of the skin with small pockets of gas in the subcutaneous soft tissues of the posterior calf concerning for infection. Clinical correlation is recommended. Electronically Signed   By: Anner Crete M.D.   On: 09/25/2015 03:15    Review of Systems  Constitutional: Negative.   HENT: Negative.   Eyes: Negative.   Respiratory: Negative.   Cardiovascular: Negative.  Gastrointestinal: Negative.   Genitourinary: Negative.   Musculoskeletal: Negative.   Skin: Negative.   Neurological: Negative.   Psychiatric/Behavioral: Negative.    Blood pressure 131/56, pulse 61, temperature 98.5 F (36.9 C), temperature source Oral, resp. rate 18, weight 44.09 kg (97 lb 3.2 oz), SpO2 100 %. Physical Exam  Constitutional: She is oriented to person, place, and time. She appears well-developed and well-nourished.  HENT:  Head: Normocephalic and atraumatic.  Eyes: Conjunctivae and EOM are normal. Pupils are equal, round, and reactive to light.  Cardiovascular: Normal rate.   Respiratory: Effort normal.  GI: Soft.  Musculoskeletal: Normal range of motion.  Neurological: She is alert and oriented to person, place, and time.  Skin: Skin is warm.  Psychiatric: She has a normal mood and affect.    Assessment/Plan: Plan for debridement and possible Acell and VAC placement.  [name] 09/26/2015, 12:22 PM

## 2015-09-26 NOTE — Interval H&P Note (Signed)
History and Physical Interval Note:  09/26/2015 12:31 PM  [name]  has presented today for surgery, with the diagnosis of infected wound  The various methods of treatment have been discussed with the patient and family. After consideration of risks, benefits and other options for treatment, the patient has consented to  Procedure(s): IRRIGATION AND DEBRIDEMENT LEG WOUND , POSSIBLE VAC (Right) as a surgical intervention .  The patient's history has been reviewed, patient examined, no change in status, stable for surgery.  I have reviewed the patient's chart and labs.  Questions were answered to the patient's satisfaction.     Wallace Going

## 2015-09-26 NOTE — Progress Notes (Signed)
Subjective: [name] is sleeping comfortably at bedside this morning. The R calf wound has moderately foul odor with significant purulent drainage. No fevers, no other symptoms currently. Scheduled for OR later today.  Objective: Vital signs in last 24 hours: Filed Vitals:   09/25/15 0901 09/25/15 1736 09/25/15 2100 09/26/15 0430  BP: 146/77 137/72 140/70 154/65  Pulse: 77 86 103 64  Temp: 98.5 F (36.9 C) 98.3 F (36.8 C) 98.8 F (37.1 C) 98.5 F (36.9 C)  TempSrc: Oral Oral Oral Oral  Resp: 16 18 16 18   Weight:   97 lb 3.2 oz (44.09 kg)   SpO2: 100% 100% 100% 100%   Weight change:   Intake/Output Summary (Last 24 hours) at 09/26/15 4008 Last data filed at 09/26/15 0600  Gross per 24 hour  Intake    462 ml  Output   1250 ml  Net   -788 ml     Gen: Well-appearing, alert and oriented to person, place, and time HEENT: Oropharynx clear without erythema or exudate.  Neck: No cervical LAD, no thyromegaly or nodules, no JVD noted. CV: Normal rate, regular rhythm, no murmurs, rubs, or gallops Pulmonary: Normal effort, CTA bilaterally, no wheezing, rales, or rhonchi Abdominal: Soft, non-tender, non-distended, without rebound, guarding, or masses Extremities: Distal pulses 2+ in upper and lower extremities bilaterally, no tenderness, erythema or edema Skin: Several wounds in various stages of healing throughout. Primary wound of interest ~10cm in diameter over R calf, erythema around the rim with dusky margins. Copious purulent drainage from wound.  Lab Results: Basic Metabolic Panel:  Recent Labs Lab 09/25/15 0205 09/25/15 0840  NA 137  --   K 3.7  --   CL 104  --   CO2 24  --   GLUCOSE 96  --   BUN 14  --   CREATININE 0.73  --   CALCIUM 9.0  --   MG  --  1.9  PHOS  --  4.4   Liver Function Tests:  Recent Labs Lab 09/25/15 0840  AST 19  ALT 10*  ALKPHOS 60  BILITOT 0.5  PROT 6.2*  ALBUMIN 2.9*   CBC:  Recent Labs Lab 09/25/15 0205  WBC 4.7    NEUTROABS 3.1  HGB 10.6*  HCT 31.2*  MCV 86.0  PLT 309   Cardiac Enzymes:  Recent Labs Lab 09/25/15 0840  TROPONINI <0.03   Coagulation:  Recent Labs Lab 09/25/15 0840  LABPROT 15.2  INR 1.18    Urinalysis:  Recent Labs Lab 09/25/15 0428  COLORURINE YELLOW  LABSPEC 1.008  PHURINE 7.5  GLUCOSEU NEGATIVE  HGBUR NEGATIVE  BILIRUBINUR NEGATIVE  KETONESUR NEGATIVE  PROTEINUR NEGATIVE  NITRITE NEGATIVE  LEUKOCYTESUR NEGATIVE   Studies/Results: Dg Chest 2 View  09/25/2015  CLINICAL DATA:  54 year old female with chest pain EXAM: CHEST  2 VIEW COMPARISON:  Chest radiograph dated 09/07/2015 FINDINGS: The heart size and mediastinal contours are within normal limits. There is apparent lung tissue anterior to the mediastinal on the lateral projection, likely related to patient positioning. Both lungs are clear. The visualized skeletal structures are unremarkable. IMPRESSION: No active cardiopulmonary disease. Electronically Signed   By: Anner Crete M.D.   On: 09/25/2015 03:12   Dg Tibia/fibula Right  09/25/2015  CLINICAL DATA:  54 year old female with possible infection of the right lower extremity. EXAM: RIGHT TIBIA AND FIBULA - 2 VIEW COMPARISON:  None. FINDINGS: There is no acute fracture or dislocation. There is irregularity of the superficial soft tissues  of the posterior calf with small pockets of subcutaneous appearing air. Clinical correlation is recommended. No radiopaque foreign object identified. IMPRESSION: Irregularity of the skin with small pockets of gas in the subcutaneous soft tissues of the posterior calf concerning for infection. Clinical correlation is recommended. Electronically Signed   By: Anner Crete M.D.   On: 09/25/2015 03:15   Assessment/Plan: Active Problems:   Wound infection ([place]) 1. Recurrent levamisole-induced vasculitis of RLE, with bacterial superinfection - patient has h/o recurrent hospitalizations for infection control and wound  care, s/p debridement of bilateral thigh lesions by general surgery in 09/2013. She has recently relapsed with her longtime addiction with crack cocaine, previously had been abstinent for ~3 months. Likely has bacterial superinfection given purulence and odor. Given normal Xray and ESR, suspicion for osteomyelitis is low at this time. -F/u wound care recs -Consulted surgery for possible debridement; appreciate recs -Continue prednisone 28m for vasculitis -Continue bactrim for MRSA, group A strep, GNR coverage -Oxycodone IR PRN for severe pain  Dispo: Disposition is deferred at this time, awaiting improvement of current medical problems.  Anticipated discharge in approximately 1-3 day(s).   The patient does have a current PCP (VZada Finders MD) and [facility].  The patient does have transportation limitations that hinder transportation to clinic appointments.  LOS: 1 day   WNorval Gable MD 09/26/2015, 8:19 AM

## 2015-09-26 NOTE — Progress Notes (Signed)
Report called to [facility] in short stay.  All questions answered.

## 2015-09-26 NOTE — Consult Note (Signed)
Reason for Consult: right leg ulcer Referring Physician:  Annelie [name] is an 54 y.o. female.  HPI: The patient is a 54 yrs old bf here for a right leg wound.  She has multiple medical problems including known vascular disease, drug use and hypertension.  She has had the wound for several months and it is not improving.  It is unclear what has been done to care for the wound.  The peri-wound area is red and the wound is not clear with no granulation tissue.  Nothing makes it better at this point and it has gotten worse over the past week.  Past Medical History  Diagnosis Date  . Vasculitis ([name])     2/2 Levimasole toxicity. Followed by Dr. Louanne [name]  . Hypertension   . Cocaine abuse     ongoing with resultant vaculitis.  . Depression   . Normocytic anemia     BL Hgb 9.8-12. Last anemia panel 04/2010 - showing Fe 19, ferritin 101.  Pt on monthly B12 injections  . 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 CAP (community acquired pneumonia) 03/2014 X 2  . Daily headache   . Migraines     "probably 5-6/yr" (04/21/2014)  . Rheumatoid arthritis(714.0)     patient reported  . Inflammatory arthritis [name])     Past Surgical History  Procedure Laterality Date  . Skin biopsy Bilateral 04/2010    shin nodules  . Irrigation and debridement abscess Bilateral 09/26/2013    Procedure: DEBRIDEMENT ULCERS BILATERAL THIGHS;  Surgeon: Cristina Ober, MD;  Location: [name];  Service: General;  Laterality: Bilateral;  . Hernia repair      "stomach"    Family History  Problem Relation Age of Onset  . Breast cancer Mother     Breast cancer  . Alcohol abuse Mother   . Colon cancer Maternal Aunt 75  . Alcohol abuse Father     Social History:  reports that she has been smoking Cigarettes.  She has a 10.2 pack-year smoking history. She has never used smokeless tobacco. She reports that she uses illicit drugs ("Crack" cocaine and Cocaine). She reports that she does  not drink alcohol.  Allergies:  Allergies  Allergen Reactions  . Acetaminophen Swelling    REACTION: eyelid swelling    Medications: I have reviewed the patient's current medications.  Results for orders placed or performed during the hospital encounter of 09/25/15 (from the past 48 hour(s))  Basic metabolic panel     Status: None   Collection Time: 09/25/15  2:05 AM  Result Value Ref Range   Sodium 137 135 - 145 mmol/L   Potassium 3.7 3.5 - 5.1 mmol/L   Chloride 104 101 - 111 mmol/L   CO2 24 22 - 32 mmol/L   Glucose, Bld 96 65 - 99 mg/dL   BUN 14 6 - 20 mg/dL   Creatinine, Ser 0.73 0.44 - 1.00 mg/dL   Calcium 9.0 8.9 - 10.3 mg/dL   GFR calc non Af Amer >60 >60 mL/min   GFR calc Af Amer >60 >60 mL/min    Comment: (NOTE) The eGFR has been calculated using the CKD EPI equation. This calculation has not been validated in all clinical situations. eGFR's persistently <60 mL/min signify possible Chronic Kidney Disease.    Anion gap 9 5 - 15  CBC     Status: Abnormal   Collection Time: 09/25/15  2:05 AM  Result Value Ref Range   WBC 4.7 4.0 - 10.5 K/uL   RBC 3.63 (L) 3.87 - 5.11 MIL/uL   Hemoglobin 10.6 (L) 12.0 - 15.0 g/dL   HCT 31.2 (L) 36.0 - 46.0 %   MCV 86.0 78.0 - 100.0 fL   MCH 29.2 26.0 - 34.0 pg   MCHC 34.0 30.0 - 36.0 g/dL   RDW 13.7 11.5 - 15.5 %   Platelets 309 150 - 400 K/uL  Differential     Status: None   Collection Time: 09/25/15  2:05 AM  Result Value Ref Range   Neutrophils Relative % 66 %   Neutro Abs 3.1 1.7 - 7.7 K/uL   Lymphocytes Relative 31 %   Lymphs Abs 1.5 0.7 - 4.0 K/uL   Monocytes Relative 2 %   Monocytes Absolute 0.1 0.1 - 1.0 K/uL   Eosinophils Relative 1 %   Eosinophils Absolute 0.1 0.0 - 0.7 K/uL   Basophils Relative 0 %   Basophils Absolute 0.0 0.0 - 0.1 K/uL  I-stat troponin, ED (not at [name], [name])     Status: None   Collection Time: 09/25/15  2:09 AM  Result Value Ref Range   Troponin i, poc 0.01 0.00 - 0.08 ng/mL   Comment 3             Comment: Due to the release kinetics of cTnI, a negative result within the first hours of the onset of symptoms does not rule out myocardial infarction with certainty. If myocardial infarction is still suspected, repeat the test at appropriate intervals.   Rapid HIV screen (HIV 1/2 Ab+Ag)     Status: None   Collection Time: 09/25/15  4:24 AM  Result Value Ref Range   HIV-1 P24 Antigen - HIV24 NON REACTIVE NON REACTIVE   HIV 1/2 Antibodies NON REACTIVE NON REACTIVE   Interpretation (HIV Ag Ab)      A non reactive test result means that HIV 1 or HIV 2 antibodies and HIV 1 p24 antigen were not detected in the specimen.  Urinalysis, Routine w reflex microscopic (not at [name])     Status: Abnormal   Collection Time: 09/25/15  4:28 AM  Result Value Ref Range   Color, Urine YELLOW YELLOW   APPearance CLOUDY (A) CLEAR   Specific Gravity, Urine 1.008 1.005 - 1.030   pH 7.5 5.0 - 8.0   Glucose, UA NEGATIVE NEGATIVE mg/dL   Hgb urine dipstick NEGATIVE NEGATIVE   Bilirubin Urine NEGATIVE NEGATIVE   Ketones, ur NEGATIVE NEGATIVE mg/dL   Protein, ur NEGATIVE NEGATIVE mg/dL   Nitrite NEGATIVE NEGATIVE   Leukocytes, UA NEGATIVE NEGATIVE    Comment: MICROSCOPIC NOT DONE ON URINES WITH NEGATIVE PROTEIN, BLOOD, LEUKOCYTES, NITRITE, OR GLUCOSE <1000 mg/dL.  Urine rapid drug screen (hosp performed)     Status: Abnormal   Collection Time: 09/25/15  4:28 AM  Result Value Ref Range   Opiates NONE DETECTED NONE DETECTED   Cocaine POSITIVE (A) NONE DETECTED   Benzodiazepines NONE DETECTED NONE DETECTED   Amphetamines NONE DETECTED NONE DETECTED   Tetrahydrocannabinol NONE DETECTED NONE DETECTED   Barbiturates NONE DETECTED NONE DETECTED    Comment:        DRUG SCREEN FOR MEDICAL PURPOSES ONLY.  IF CONFIRMATION IS NEEDED FOR ANY PURPOSE, NOTIFY LAB WITHIN 5 DAYS.        LOWEST DETECTABLE LIMITS FOR URINE DRUG SCREEN Drug Class       Cutoff (ng/mL) Amphetamine      1000  Barbiturate       200 Benzodiazepine   701 Tricyclics       779 Opiates          300 Cocaine          300 THC              50   Wound culture     Status: None (Preliminary result)   Collection Time: 09/25/15  5:03 AM  Result Value Ref Range   Specimen Description WOUND RIGHT LEG    Special Requests NONE    Gram Stain      MODERATE WBC PRESENT,BOTH PMN AND MONONUCLEAR NO SQUAMOUS EPITHELIAL CELLS SEEN MODERATE GRAM POSITIVE COCCI IN PAIRS Performed at [facility]    Culture PENDING    Report Status PENDING   Troponin I     Status: None   Collection Time: 09/25/15  8:40 AM  Result Value Ref Range   Troponin I <0.03 <0.031 ng/mL    Comment:        NO INDICATION OF MYOCARDIAL INJURY.   Phosphorus     Status: None   Collection Time: 09/25/15  8:40 AM  Result Value Ref Range   Phosphorus 4.4 2.5 - 4.6 mg/dL  Hepatic function panel     Status: Abnormal   Collection Time: 09/25/15  8:40 AM  Result Value Ref Range   Total Protein 6.2 (L) 6.5 - 8.1 g/dL   Albumin 2.9 (L) 3.5 - 5.0 g/dL   AST 19 15 - 41 U/L   ALT 10 (L) 14 - 54 U/L   Alkaline Phosphatase 60 38 - 126 U/L   Total Bilirubin 0.5 0.3 - 1.2 mg/dL   Bilirubin, Direct <0.1 (L) 0.1 - 0.5 mg/dL   Indirect Bilirubin NOT CALCULATED 0.3 - 0.9 mg/dL  Protime-INR     Status: None   Collection Time: 09/25/15  8:40 AM  Result Value Ref Range   Prothrombin Time 15.2 11.6 - 15.2 seconds   INR 1.18 0.00 - 1.49  Magnesium     Status: None   Collection Time: 09/25/15  8:40 AM  Result Value Ref Range   Magnesium 1.9 1.7 - 2.4 mg/dL    Dg Chest 2 View  09/25/2015  CLINICAL DATA:  54 year old female with chest pain EXAM: CHEST  2 VIEW COMPARISON:  Chest radiograph dated 09/07/2015 FINDINGS: The heart size and mediastinal contours are within normal limits. There is apparent lung tissue anterior to the mediastinal on the lateral projection, likely related to patient positioning. Both lungs are clear. The visualized skeletal structures are  unremarkable. IMPRESSION: No active cardiopulmonary disease. Electronically Signed   By: Anner Crete M.D.   On: 09/25/2015 03:12   Dg Tibia/fibula Right  09/25/2015  CLINICAL DATA:  54 year old female with possible infection of the right lower extremity. EXAM: RIGHT TIBIA AND FIBULA - 2 VIEW COMPARISON:  None. FINDINGS: There is no acute fracture or dislocation. There is irregularity of the superficial soft tissues of the posterior calf with small pockets of subcutaneous appearing air. Clinical correlation is recommended. No radiopaque foreign object identified. IMPRESSION: Irregularity of the skin with small pockets of gas in the subcutaneous soft tissues of the posterior calf concerning for infection. Clinical correlation is recommended. Electronically Signed   By: Anner Crete M.D.   On: 09/25/2015 03:15    Review of Systems  Constitutional: Negative.   HENT: Negative.   Eyes: Negative.   Respiratory: Negative.   Cardiovascular: Negative.  Gastrointestinal: Negative.   Genitourinary: Negative.   Musculoskeletal: Negative.   Skin: Negative.   Neurological: Negative.   Psychiatric/Behavioral: Negative.    Blood pressure 131/56, pulse 61, temperature 98.5 F (36.9 C), temperature source Oral, resp. rate 18, weight 44.09 kg (97 lb 3.2 oz), SpO2 100 %. Physical Exam  Constitutional: She is oriented to person, place, and time. She appears well-developed and well-nourished.  HENT:  Head: Normocephalic and atraumatic.  Eyes: Conjunctivae and EOM are normal. Pupils are equal, round, and reactive to light.  Cardiovascular: Normal rate.   Respiratory: Effort normal.  GI: Soft.  Musculoskeletal: Normal range of motion.  Neurological: She is alert and oriented to person, place, and time.  Skin: Skin is warm.  Psychiatric: She has a normal mood and affect.    Assessment/Plan: Plan for debridement and possible Acell and VAC placement.  Wallace Going 09/26/2015, 12:22 PM

## 2015-09-26 NOTE — Anesthesia Procedure Notes (Signed)
Procedure Name: LMA Insertion Date/Time: 09/26/2015 1:01 PM Performed by: Tressia Miners LEFFEW Pre-anesthesia Checklist: Patient identified, Emergency Drugs available, Suction available, Timeout performed and Patient being monitored Patient Re-evaluated:Patient Re-evaluated prior to inductionOxygen Delivery Method: Circle system utilized Preoxygenation: Pre-oxygenation with 100% oxygen Intubation Type: IV induction Ventilation: Mask ventilation without difficulty LMA: LMA inserted LMA Size: 4.0 Number of attempts: 1 Tube secured with: Tape Dental Injury: Teeth and Oropharynx as per pre-operative assessment

## 2015-09-26 NOTE — Anesthesia Postprocedure Evaluation (Signed)
Anesthesia Post Note  Patient: [name]  Procedure(s) Performed: Procedure(s) (LRB): IRRIGATION AND DEBRIDEMENT LEG WOUND  VAC PLACEMENT. (Right)  Patient location during evaluation: PACU Anesthesia Type: General Level of consciousness: awake and alert, patient cooperative and oriented Pain management: pain level controlled Vital Signs Assessment: post-procedure vital signs reviewed and stable Respiratory status: spontaneous breathing, nonlabored ventilation and respiratory function stable Cardiovascular status: blood pressure returned to baseline and stable Postop Assessment: no signs of nausea or vomiting Anesthetic complications: no    Last Vitals:  Filed Vitals:   09/26/15 1425 09/26/15 1454  BP: 171/84 156/85  Pulse: 73 78  Temp:  36.8 C  Resp: 17 18    Last Pain:  Filed Vitals:   09/26/15 1455  PainSc: 0-No pain                 Khalif Stender,E. Loyal Holzheimer

## 2015-09-26 NOTE — Transfer of Care (Signed)
Immediate Anesthesia Transfer of Care Note  Patient: [name]  Procedure(s) Performed: Procedure(s): IRRIGATION AND DEBRIDEMENT LEG WOUND  VAC PLACEMENT. (Right)  Patient Location: PACU  Anesthesia Type:General  Level of Consciousness: awake and patient cooperative  Airway & Oxygen Therapy: Patient Spontanous Breathing and Patient connected to nasal cannula oxygen  Post-op Assessment: Report given to RN, Post -op Vital signs reviewed and stable and Patient moving all extremities X 4  Post vital signs: Reviewed and stable  Last Vitals:  Filed Vitals:   09/26/15 0430 09/26/15 1000  BP: 154/65 131/56  Pulse: 64 61  Temp: 36.9 C 36.9 C  Resp: 18 18    Complications: No apparent anesthesia complications

## 2015-09-26 NOTE — Progress Notes (Signed)
    Subjective:     Patient reports pain as 5 on 0-10 scale.   Denies CP or SOB.  Voiding without difficulty. Positive flatus. Objective: Vital signs in last 24 hours: Temp:  [98.3 F (36.8 C)-98.8 F (37.1 C)] 98.5 F (36.9 C) (12/22 0430) Pulse Rate:  [64-103] 64 (12/22 0430) Resp:  [16-18] 18 (12/22 0430) BP: (137-154)/(65-77) 154/65 mmHg (12/22 0430) SpO2:  [100 %] 100 % (12/22 0430) Weight:  [44.09 kg (97 lb 3.2 oz)] 44.09 kg (97 lb 3.2 oz) (12/21 2100)  Intake/Output from previous day: 12/21 0701 - 12/22 0700 In: 462 [P.O.:462] Out: 1250 [Urine:1250] Intake/Output this shift: Total I/O In: 222 [P.O.:222] Out: 650 [Urine:650]  Labs:  Recent Labs  09/25/15 0205  HGB 10.6*    Recent Labs  09/25/15 0205  WBC 4.7  RBC 3.63*  HCT 31.2*  PLT 309    Recent Labs  09/25/15 0205  NA 137  K 3.7  CL 104  CO2 24  BUN 14  CREATININE 0.73  GLUCOSE 96  CALCIUM 9.0    Recent Labs  09/25/15 0840  INR 1.18    Physical Exam: Neurologically intact Intact pulses distally foul ordor from skin infection Skin macerated, purulent drainage noted  Assessment/Plan: Considered formal I&D with vac application yesterday, but patient eat and was not septic.   Evaluated wound this AM - myositis/cellulitis noted with purulent drainage This has been going on for sometime - not acute Will discuss with Dr Migdalia Dk (plastic surgery) Definitive management is beyond the scope of my expertise Continue NPO for now  [facility] D for Dr. Melina [facility] [phone] 09/26/2015, 6:08 AM

## 2015-09-26 NOTE — Op Note (Signed)
Operative Note   DATE OF OPERATION: 09/26/2015  LOCATION: [facility]patient  SURGICAL DIVISION: Plastic Surgery  PREOPERATIVE DIAGNOSES:  Right leg wound 4 x 10 cm  POSTOPERATIVE DIAGNOSES:  same  PROCEDURE:  Excisional debridement of skin and soft tissue/fat 4 x 10 cm, placement of Acell (1 gm powder, 5 x 5 cm sheet two) and VAC sponge  SURGEON: [employer], DO  ASSISTANT: Shawn Rayburn, PA  ANESTHESIA:  General.   COMPLICATIONS: None.   INDICATIONS FOR PROCEDURE:  The patient, [name] is a 54 y.o. female born on June 04, 1961, is here for treatment of a chronic right leg wound that has been getting worse and involves the fat. MRN: [id number]  CONSENT:  Informed consent was obtained directly from the patient. Risks, benefits and alternatives were fully discussed. Specific risks including but not limited to bleeding, infection, hematoma, seroma, scarring, pain, infection, contracture, asymmetry, wound healing problems, and need for further surgery were all discussed. The patient did have an ample opportunity to have questions answered to satisfaction.   DESCRIPTION OF PROCEDURE:  The patient was taken to the operating room. A left leg SCD was placed and IV antibiotics were given. The patient's operative site was prepped and draped in a sterile fashion. A time out was performed and all information was confirmed to be correct.  General anesthesia was administered.  The #10 blade was used to debride the wound including skin, soft tissue and fat.  The leg was irrigated with saline and antibiotic solution.  Hemostasis was achieved with electrocautery.  All of the Acell sheet and powder vac were place with an adaptic on top.  The VAC was applied and there was an excellent seal.  The patient tolerated the procedure well.  There were no complications. The patient was allowed to wake from anesthesia, extubated and taken to the recovery room in satisfactory condition.

## 2015-09-26 NOTE — Brief Op Note (Signed)
09/25/2015 - 09/26/2015  1:40 PM  PATIENT:  [name]  54 y.o. female  PRE-OPERATIVE DIAGNOSIS:  infected wound  POST-OPERATIVE DIAGNOSIS:  Infected leg wound.  PROCEDURE:  Procedure(s): IRRIGATION AND DEBRIDEMENT LEG WOUND  VAC PLACEMENT. (Right)  SURGEON:  Surgeon(s) and Role:    * Glynda Soliday S Ralphine Hinks, DO - Primary  PHYSICIAN ASSISTANT: Shawn Rayburn, PA  ASSISTANTS: none   ANESTHESIA:   general  EBL:     BLOOD ADMINISTERED:none  DRAINS: none   LOCAL MEDICATIONS USED:  NONE  SPECIMEN:  No Specimen  DISPOSITION OF SPECIMEN:  N/A  COUNTS:  YES  TOURNIQUET:  * No tourniquets in log *  DICTATION: .Dragon Dictation  PLAN OF CARE: Admit to inpatient   PATIENT DISPOSITION:  PACU - hemodynamically stable.   Delay start of Pharmacological VTE agent (>24hrs) due to surgical blood loss or risk of bleeding: no

## 2015-09-26 NOTE — Anesthesia Preprocedure Evaluation (Signed)
Anesthesia Evaluation  Patient identified by MRN, date of birth, ID band Patient awake    Reviewed: Allergy & Precautions, NPO status , Patient's Chart, lab work & pertinent test results  History of Anesthesia Complications Negative for: history of anesthetic complications  Airway Mallampati: I  TM Distance: >3 FB Neck ROM: Full    Dental  (+) Edentulous Upper, Edentulous Lower   Pulmonary Current Smoker,    breath sounds clear to auscultation       Cardiovascular hypertension, Pt. on medications  Rhythm:Regular Rate:Normal  7/16 ECHO: EF 60-65%, valves OK   Neuro/Psych Anxiety Depression negative neurological ROS     GI/Hepatic negative GI ROS, (+)     substance abuse  cocaine use and marijuana use,   Endo/Other  negative endocrine ROS  Renal/GU negative Renal ROS     Musculoskeletal  (+) Arthritis ,   Abdominal   Peds  Hematology  (+) Blood dyscrasia (Hb 10.6), ,   Anesthesia Other Findings   Reproductive/Obstetrics                             Anesthesia Physical Anesthesia Plan  ASA: III  Anesthesia Plan: General   Post-op Pain Management:    Induction: Intravenous  Airway Management Planned: LMA  Additional Equipment:   Intra-op Plan:   Post-operative Plan:   Informed Consent:   Plan Discussed with: CRNA and Surgeon  Anesthesia Plan Comments: (Plan routine monitors, GA- LMA OK)        Anesthesia Quick Evaluation

## 2015-09-27 ENCOUNTER — Encounter (HOSPITAL_COMMUNITY): Payer: Self-pay | Admitting: Plastic Surgery

## 2015-09-27 NOTE — Evaluation (Signed)
Physical Therapy Evaluation Patient Details Name: [name] MRN: [id number] DOB: August 19, 1961 Today's Date: 09/27/2015   History of Present Illness  54 y.o. Female admitted with recurrent levamisole-induced vasculitis of RLE, with bacterial superinfection. s/p RLE I&D with VAC placement 12/22.  Clinical Impression  Patient is seen following the above procedure, presenting with functional limitations due to the deficits listed below (see PT Problem List). Did well with mobility until she attempted to ambulate, requiring min assist to take just two small hops with RW, unable to bear weight through RLE, and becomes very tearful due to pain. Improved with LE elevation. Unable to tolerate further gait despite being pre-medicated. Patient will benefit from skilled PT to increase their independence and safety with mobility to allow discharge to the venue listed below.       Follow Up Recommendations SNF    Equipment Recommendations  Rolling walker with 5" wheels    Recommendations for Other Services       Precautions / Restrictions Precautions Precautions: Fall Restrictions Weight Bearing Restrictions: No      Mobility  Bed Mobility Overal bed mobility: Modified Independent             General bed mobility comments: extra time  Transfers Overall transfer level: Needs assistance Equipment used: Rolling walker (2 wheeled) Transfers: Sit to/from Stand Sit to Stand: Min guard         General transfer comment: min guard for safety. VC for technique. Unable to bear more than toe-touch weight through RLE to assist with rise from bed. Practiced x2 with cues for technique and hand placement.  Ambulation/Gait Ambulation/Gait assistance: Min assist Ambulation Distance (Feet): 2 Feet Assistive device: Rolling walker (2 wheeled) Gait Pattern/deviations: Step-to pattern   Gait velocity interpretation: Below normal speed for age/gender General Gait Details: Took a couple of small  steps forward with LLE but unable to tolerate weight though RLE. Became tearful. Min assist for balance and max VC to back up to bed before attempting to sit.   Stairs            Wheelchair Mobility    Modified Rankin (Stroke Patients Only)       Balance Overall balance assessment: Needs assistance Sitting-balance support: No upper extremity supported;Feet supported Sitting balance-Leahy Scale: Good     Standing balance support: Bilateral upper extremity supported Standing balance-Leahy Scale: Poor                               Pertinent Vitals/Pain Pain Assessment: Faces Faces Pain Scale: Hurts worst Pain Location: RLE Pain Descriptors / Indicators: Throbbing Pain Intervention(s): Monitored during session;Repositioned;Premedicated before session    [place] expects to be discharged to:: Private residence Living Arrangements: Other relatives Available Help at Discharge: Family;Available PRN/intermittently;Friend(s) Type of Home: Other(Comment) ("boarding house") Home Access: Stairs to enter Entrance Stairs-Rails: None Entrance Stairs-Number of Steps: 1 (back door) Home Layout: One level Home Equipment: None      Prior Function Level of Independence: Independent               Hand Dominance        Extremity/Trunk Assessment   Upper Extremity Assessment: Defer to OT evaluation           Lower Extremity Assessment: RLE deficits/detail RLE Deficits / Details: Limited ankle ROM tolerance due to pain. able to fully extend knee       Communication   Communication: No  difficulties  Cognition Arousal/Alertness: Awake/alert Behavior During Therapy: WFL for tasks assessed/performed Overall Cognitive Status: Within Functional Limits for tasks assessed                      General Comments General comments (skin integrity, edema, etc.): Very tearful after attempting to stand, pt premedicated but in immense pain.  Improved with elevation of RLE.    Exercises General Exercises - Lower Extremity Ankle Circles/Pumps: Right;15 reps;Seated;AROM Short Arc Quad: AAROM;Strengthening;Right;10 reps;Seated      Assessment/Plan    PT Assessment Patient needs continued PT services  PT Diagnosis Difficulty walking;Generalized weakness;Acute pain   PT Problem List Decreased strength;Decreased range of motion;Decreased activity tolerance;Decreased balance;Decreased mobility;Decreased knowledge of use of DME;Pain  PT Treatment Interventions DME instruction;Gait training;Functional mobility training;Therapeutic activities;Therapeutic exercise;Balance training;Neuromuscular re-education;Patient/family education;Modalities   PT Goals (Current goals can be found in the Care Plan section) Acute Rehab PT Goals Patient Stated Goal: Go home PT Goal Formulation: With patient Time For Goal Achievement: 10/11/15 Potential to Achieve Goals: Good    Frequency Min 3X/week   Barriers to discharge        Co-evaluation               End of Session Equipment Utilized During Treatment: Gait belt Activity Tolerance: Patient limited by pain Patient left: in bed;with call bell/phone within reach;with bed alarm set;with SCD's reapplied Nurse Communication: Mobility status;Patient requests pain meds         Time: 1120-1136 PT Time Calculation (min) (ACUTE ONLY): 16 min   Charges:   PT Evaluation $Initial PT Evaluation Tier I: 1 Procedure     PT G CodesEllouise Newer 09/27/2015, 12:07 PM  Elayne Snare, [place]

## 2015-09-27 NOTE — Progress Notes (Signed)
 Subjective: Patient seen and examined this morning this morning on rounds.  Sleeping comfortably but easily able to arouse.  Had some pain when bed sheets pulled down to look at right calf.  She is status post excisional debridement of skin and soft/tissue/fat 4x10 cm, placement of Acell and VAC sponge.  No other complaints.  Patient states she has a home to go to at discharge as well as people she lives with.  Objective: Vital signs in last 24 hours: Filed Vitals:   09/26/15 1425 09/26/15 1454 09/26/15 2157 09/27/15 0538  BP: 171/84 156/85 112/66 137/80  Pulse: 73 78 71 78  Temp:  98.2 F (36.8 C) 98.3 F (36.8 C) 98.6 F (37 C)  TempSrc:  Oral Oral Oral  Resp: 17 18 18 19  Weight:      SpO2: 100% 100% 100% 100%   Weight change:   Intake/Output Summary (Last 24 hours) at 09/27/15 1044 Last data filed at 09/27/15 0655  Gross per 24 hour  Intake   1380 ml  Output   1200 ml  Net    180 ml   Gen: sleeping comfortably, easily aroused, no distress HEENT: nose and ears with vasculitic changes CV: normal rate, regular rhythm, no murmurs, rubs, or gallops Pulmonary: normal effort, CTA bilaterally, no wheezing, rales, or rhonchi Abdominal: soft, non-tender, non-distended, without rebound, guarding, or masses Skin: Several wounds in various stages of healing throughout.  Extremities: right calf is wrapped in Ace bandage with no drainage through bandage.  Wound vac in place  Lab Results: Basic Metabolic Panel:  Recent Labs Lab 09/25/15 0205 09/25/15 0840  NA 137  --   K 3.7  --   CL 104  --   CO2 24  --   GLUCOSE 96  --   BUN 14  --   CREATININE 0.73  --   CALCIUM 9.0  --   MG  --  1.9  PHOS  --  4.4   Liver Function Tests:  Recent Labs Lab 09/25/15 0840  AST 19  ALT 10*  ALKPHOS 60  BILITOT 0.5  PROT 6.2*  ALBUMIN 2.9*   CBC:  Recent Labs Lab 09/25/15 0205  WBC 4.7  NEUTROABS 3.1  HGB 10.6*  HCT 31.2*  MCV 86.0  PLT 309   Cardiac  Enzymes:  Recent Labs Lab 09/25/15 0840  TROPONINI <0.03   Coagulation:  Recent Labs Lab 09/25/15 0840  LABPROT 15.2  INR 1.18    Urinalysis:  Recent Labs Lab 09/25/15 0428  COLORURINE YELLOW  LABSPEC 1.008  PHURINE 7.5  GLUCOSEU NEGATIVE  HGBUR NEGATIVE  BILIRUBINUR NEGATIVE  KETONESUR NEGATIVE  PROTEINUR NEGATIVE  NITRITE NEGATIVE  LEUKOCYTESUR NEGATIVE   Studies/Results: No results found. Assessment/Plan: Active Problems:   Wound infection (HCC)   Infected wound (HCC)  Recurrent levamisole-induced vasculitis of RLE, with bacterial superinfection  - history of recurrent hospitalizations for infection control and wound care, status post debridement of bilateral thigh lesions by general surgery in 09/2013.  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  She has recently relapsed after approximately three months of abstinence.  It is presumed that she has bacterial superinfection given purulence and odor that was present.  Had normal x-ray and ESR so suspicion for osteomyelitis was low.   - underwent debridement yesterday with Dr. Sanger, appreciate their assistance - wound culture 12/21 with abundant Staph aureus, sensitivities pending - follow wound care recommenations - continue prednisone 60mg for   vasculitis - continue TMP-SMX 800-160mg BID - continue Oxycodone IR 5mg q4h prn for severe pain - PT evaluation pending as patient may need SNF placement  Dispo: Disposition is deferred at this time, awaiting improvement of current medical problems.  Anticipated discharge in approximately 1-3 day(s).   The patient does have a current PCP (Vishal Patel, MD) and does need an OPC hospital follow-up appointment after discharge.  The patient does have transportation limitations that hinder transportation to clinic appointments.  LOS: 2 days    , DO 09/27/2015, 10:44 AM 

## 2015-09-28 LAB — WOUND CULTURE

## 2015-09-28 MED ORDER — DOXYCYCLINE HYCLATE 100 MG PO TABS
100.0000 mg | ORAL_TABLET | Freq: Two times a day (BID) | ORAL | Status: DC
  Administered 2015-09-28 – 2015-10-02 (×9): 100 mg via ORAL
  Filled 2015-09-28 (×9): qty 1

## 2015-09-28 MED ORDER — ENOXAPARIN SODIUM 30 MG/0.3ML ~~LOC~~ SOLN
20.0000 mg | SUBCUTANEOUS | Status: DC
  Administered 2015-09-28 – 2015-09-30 (×3): 20 mg via SUBCUTANEOUS
  Filled 2015-09-28 (×4): qty 0.2
  Filled 2015-09-28 (×2): qty 0.3

## 2015-09-28 NOTE — Progress Notes (Signed)
Subjective: Patient seen and examined this morning.  No acute events overnight.  Status post day #2 for excisional debridement of skin and soft tissue/fat, placement of Acell and VAC sponge.  Patient tearful today, stating her leg is hurting.  Passing flatus, no BM since admission, but not feeling constipated.  Urinating without difficulty.  No other complaints.  Objective: Vital signs in last 24 hours: Filed Vitals:   09/27/15 1000 09/27/15 1700 09/27/15 2135 09/28/15 0420  BP: 155/72 130/58 132/65 155/67  Pulse: 80 76 90 52  Temp: 98.4 F (36.9 C) 98.9 F (37.2 C) 98.6 F (37 C) 98.5 F (36.9 C)  TempSrc: Oral Oral Oral Oral  Resp: 18 18 19 20   Height:      Weight:   97 lb 10.6 oz (44.3 kg)   SpO2: 100% 100% 100% 100%   Weight change:   Intake/Output Summary (Last 24 hours) at 09/28/15 6283 Last data filed at 09/28/15 6629  Gross per 24 hour  Intake    360 ml  Output   1650 ml  Net  -1290 ml   Gen: tearful, no acute distress, lying in bed HEENT: nose and ears with vasculitic changes CV: normal rate, regular rhythm, no murmurs, rubs, or gallops Pulmonary: normal effort, CTA bilaterally, no wheezing, rales, or rhonchi Abdominal: soft, non-tender, non-distended, without rebound, guarding, or masses Skin: Several wounds in various stages of healing throughout.  Extremities: right calf is wrapped in Ace bandage with no drainage through bandage.  Wound vac in place  Lab Results: Basic Metabolic Panel:  Recent Labs Lab 09/25/15 0205 09/25/15 0840  NA 137  --   K 3.7  --   CL 104  --   CO2 24  --   GLUCOSE 96  --   BUN 14  --   CREATININE 0.73  --   CALCIUM 9.0  --   MG  --  1.9  PHOS  --  4.4   Liver Function Tests:  Recent Labs Lab 09/25/15 0840  AST 19  ALT 10*  ALKPHOS 60  BILITOT 0.5  PROT 6.2*  ALBUMIN 2.9*   CBC:  Recent Labs Lab 09/25/15 0205  WBC 4.7  NEUTROABS 3.1  HGB 10.6*  HCT 31.2*  MCV 86.0  PLT 309   Cardiac  Enzymes:  Recent Labs Lab 09/25/15 0840  TROPONINI <0.03   Coagulation:  Recent Labs Lab 09/25/15 0840  LABPROT 15.2  INR 1.18    Urinalysis:  Recent Labs Lab 09/25/15 0428  COLORURINE YELLOW  LABSPEC 1.008  PHURINE 7.5  GLUCOSEU NEGATIVE  HGBUR NEGATIVE  BILIRUBINUR NEGATIVE  KETONESUR NEGATIVE  PROTEINUR NEGATIVE  NITRITE NEGATIVE  LEUKOCYTESUR NEGATIVE   Studies/Results: No results found. Assessment/Plan: Active Problems:   Wound infection ([place])   Infected wound ([place])  Recurrent levamisole-induced vasculitis of RLE, with bacterial superinfection  - history of recurrent hospitalizations for infection control and wound care, status post debridement of bilateral thigh lesions by general surgery in 09/2013.  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  She has recently relapsed after approximately three months of abstinence.  It is presumed that she has bacterial superinfection given purulence and odor that was present.  Had normal x-ray and ESR so suspicion for osteomyelitis was low.   - underwent debridement 12/22 with Dr. Migdalia Dk, appreciate their assistance - wound culture 12/21 with abundant Staph aureus, sensitivities pending as of 12/24 - follow wound care recommenations - continue prednisone 26m  for vasculitis.  Will proceed with gradual taper of 381m per week until off - continue TMP-SMX 800-1614mBID.  First day of tx 12/21, plan for 1 week of antibiotics - continue Oxycodone IR 81m7m4h prn for severe pain - PT evaluation recommending SNF placement, patient agreeable to this.  CSW has been consulted regarding placement  Dispo: Disposition to SNF potentially today if able to locate bed for her.  The patient does have a current PCP (VisZada FindersD) and [facility]  The patient does have transportation limitations that hinder transportation to clinic  appointments.  LOS: 3 days   AndJule SerO 09/28/2015, 8:22 AM

## 2015-09-28 NOTE — Discharge Summary (Signed)
In error

## 2015-09-29 NOTE — Progress Notes (Signed)
Subjective: Patient seen and examined this morning on rounds.  No acute events overnight.  Status post day #3 for excisional debridement of skin and soft tissue/fat, placement of Acell and VAC sponge.  Patient with right leg pain at site of surgery adequately controlled with oral pain meds.  Objective: Vital signs in last 24 hours: Filed Vitals:   09/28/15 0913 09/28/15 1758 09/28/15 2100 09/29/15 0500  BP: 117/48 124/74 121/63 143/77  Pulse: 75 67 67 60  Temp: 97.9 F (36.6 C) 98 F (36.7 C) 98 F (36.7 C) 98 F (36.7 C)  TempSrc: Oral Oral Oral Oral  Resp: _0 Height:      Weight:   101 lb 6.4 oz (45.995 kg)   SpO2: 100% 100% 100% 100%   Weight change: 3 lb 11.8 oz (1.695 kg)  Intake/Output Summary (Last 24 hours) at 09/29/15 1054 Last data filed at 09/29/15 0600  Gross per 24 hour  Intake    840 ml  Output    525 ml  Net    315 ml   Gen: no acute distress, lying in bed HEENT: nose and ears with vasculitic changes CV: normal rate, regular rhythm, no murmurs, rubs, or gallops Pulmonary: normal effort, CTA bilaterally, no wheezing, rales, or rhonchi Abdominal: soft, non-tender, non-distended, without rebound, guarding, or masses Skin: Several wounds in various stages of healing throughout.  Extremities: right calf is wrapped in Ace bandage with no drainage through bandage.  Wound vac in place with small amount of bloody drainage.  Left leg wound with adhesive bandage that looks to be healing well.  Lab Results: Basic Metabolic Panel:  Recent Labs Lab 09/25/15 0205 09/25/15 0840  NA 137  --   K 3.7  --   CL 104  --   CO2 24  --   GLUCOSE 96  --   BUN 14  --   CREATININE 0.73  --   CALCIUM 9.0  --   MG  --  1.9  PHOS  --  4.4   Liver Function Tests:  Recent Labs Lab 09/25/15 0840  AST 19  ALT 10*  ALKPHOS 60  BILITOT 0.5  PROT 6.2*  ALBUMIN 2.9*   CBC:  Recent Labs Lab 09/25/15 0205  WBC 4.7  NEUTROABS 3.1  HGB 10.6*  HCT 31.2*    MCV 86.0  PLT 309   Cardiac Enzymes:  Recent Labs Lab 09/25/15 0840  TROPONINI <0.03   Coagulation:  Recent Labs Lab 09/25/15 0840  LABPROT 15.2  INR 1.18    Urinalysis:  Recent Labs Lab 09/25/15 0428  COLORURINE YELLOW  LABSPEC 1.008  PHURINE 7.5  GLUCOSEU NEGATIVE  HGBUR NEGATIVE  BILIRUBINUR NEGATIVE  KETONESUR NEGATIVE  PROTEINUR NEGATIVE  NITRITE NEGATIVE  LEUKOCYTESUR NEGATIVE   Studies/Results: No results found. Assessment/Plan: Active Problems:   Wound infection ([place])   Infected wound ([place])  Recurrent levamisole-induced vasculitis of RLE, with bacterial superinfection  - history of recurrent hospitalizations for infection control and wound care, status post debridement of bilateral thigh lesions by general surgery in 09/2013.  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  She has recently relapsed after approximately three months of abstinence.  It is presumed that she has bacterial superinfection given purulence and odor that was present.  Had normal x-ray and ESR so suspicion for osteomyelitis was low.   - underwent debridement 12/22 with Dr. Migdalia Dk, appreciate their assistance - wound culture 12/21 with  abundant Staph aureus, sensitivities returned on 12/24, RESISTANT to TMP-SMX, antibiotics changed to Doxycycline - follow wound care recommenations - continue prednisone 29m for vasculitis.  Will proceed with gradual taper of 129mper week until off - continue Doxycycline 10044mID x 7 days.  First day of tx 12/24, plan for 1 week of antibiotics - continue Oxycodone IR 5mg62mh prn for severe pain - PT evaluation recommending SNF placement, patient agreeable to this.  CSW has been consulted regarding placement.  After speaking with CSW placement may be difficult given history of substance abuse, insurance but will still attempt this placement  Dispo: Disposition to SNF pending if able to locate bed for  her.  The patient does have a current PCP (VishZada Finders) and [facility].  The patient does have transportation limitations that hinder transportation to clinic appointments.  LOS: 4 days   AndrJule Ser 09/29/2015, 10:54 AM

## 2015-09-29 NOTE — Progress Notes (Signed)
Patient ambulated in the hallway with assistance of a walker. She tolerated it well with very minimal pain. Is eager to discharge home and not to a facility.

## 2015-09-30 LAB — CULTURE, BLOOD (ROUTINE X 2)
CULTURE: NO GROWTH
Culture: NO GROWTH

## 2015-09-30 NOTE — Progress Notes (Signed)
Subjective: Patient seen and examined this morning on rounds.  No acute events overnight.  Status post day #4 for excisional debridement of skin and soft tissue/fat, placement of Acell and VAC sponge.   Right calf still hurts but is doing better, had bandage changed on left lower leg wound yesterday.  Ambulated in hallway with nursing yesterday with minimal pain.  Expressing desire to be discharged to home versus skilled rehab facility.  No other complaints other than some reflux-type symptoms associated with medications yesterday but unsure which one specifically.  Objective: Vital signs in last 24 hours: Filed Vitals:   09/29/15 1000 09/29/15 1742 09/29/15 2053 09/30/15 0413  BP: 149/78 129/68 134/73 131/65  Pulse: 65 69 56 55  Temp: 98 F (36.7 C) 98.2 F (36.8 C) 98.5 F (36.9 C) 98.5 F (36.9 C)  TempSrc: Oral Oral Oral Oral  Resp: _0 Height:      Weight:   100 lb 12 oz (45.7 kg)   SpO2: 100% 100% 100% 100%   Weight change: -10.4 oz (-0.295 kg)  Intake/Output Summary (Last 24 hours) at 09/30/15 8099 Last data filed at 09/30/15 8338  Gross per 24 hour  Intake    840 ml  Output    450 ml  Net    390 ml   Gen: pleasant female, no acute distress, lying in bed HEENT: nose and ears with vasculitic changes CV: normal rate, regular rhythm, no murmurs, rubs, or gallops Pulmonary: normal effort, CTA bilaterally, no wheezing, rales, or rhonch Skin: Several wounds in various stages of healing throughout.  Extremities: right calf is wrapped in Ace bandage with no drainage through bandage.  Wound vac in place with small amount of bloody drainage.  Left leg wound covered with adhesive bandage.  Lab Results: Basic Metabolic Panel:  Recent Labs Lab 09/25/15 0205 09/25/15 0840  NA 137  --   K 3.7  --   CL 104  --   CO2 24  --   GLUCOSE 96  --   BUN 14  --   CREATININE 0.73  --   CALCIUM 9.0  --   MG  --  1.9  PHOS  --  4.4   Liver Function Tests:  Recent  Labs Lab 09/25/15 0840  AST 19  ALT 10*  ALKPHOS 60  BILITOT 0.5  PROT 6.2*  ALBUMIN 2.9*   CBC:  Recent Labs Lab 09/25/15 0205  WBC 4.7  NEUTROABS 3.1  HGB 10.6*  HCT 31.2*  MCV 86.0  PLT 309   Cardiac Enzymes:  Recent Labs Lab 09/25/15 0840  TROPONINI <0.03   Coagulation:  Recent Labs Lab 09/25/15 0840  LABPROT 15.2  INR 1.18    Urinalysis:  Recent Labs Lab 09/25/15 0428  COLORURINE YELLOW  LABSPEC 1.008  PHURINE 7.5  GLUCOSEU NEGATIVE  HGBUR NEGATIVE  BILIRUBINUR NEGATIVE  KETONESUR NEGATIVE  PROTEINUR NEGATIVE  NITRITE NEGATIVE  LEUKOCYTESUR NEGATIVE   Studies/Results: No results found. Assessment/Plan: Active Problems:   Wound infection ([place])   Infected wound ([place])  Recurrent levamisole-induced vasculitis of RLE, with bacterial superinfection  - history of recurrent hospitalizations for infection control and wound care, status post debridement of bilateral thigh lesions by general surgery in 09/2013.  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  She has recently relapsed after approximately three months of abstinence.  It is presumed that she has bacterial superinfection given purulence and odor that was present.  Had normal x-ray and ESR so suspicion for osteomyelitis was low.   - underwent debridement 12/22 with Dr. Migdalia Dk, appreciate their assistance.  Will attempt to reach out to them today to see about removing wound vac for possible discharge to home today with home health follow up. - wound culture 12/21 with abundant Staph aureus, sensitivities returned on 12/24, RESISTANT to TMP-SMX, antibiotics changed to Doxycycline - follow wound care recommenations - continue prednisone 14m for vasculitis.  Will proceed with gradual taper of 168mper week until off - continue Doxycycline 10064mID x 7 days.  First day of tx 12/24, plan for 1 week of antibiotics - continue Oxycodone IR 5mg62mh prn for  severe pain while inpatient, discharge pain control will be ibuprofen and Tylenol - PT evaluation recommending SNF placement, patient originally more agreeable to this.  CSW has been consulted regarding placement.  However, patient now expressing desire to be discharged to home.  Dispo: Potential for discharge to home today pending discussion with surgery re: wound vac.  The patient does have a current PCP (VishZada [name]) and [facility]  The patient does have transportation limitations that hinder transportation to clinic appointments.   LOS: 5 days   AndrJule Ser 09/30/2015, 9:07 AM

## 2015-09-30 NOTE — Care Management Note (Signed)
Case Management Note  Patient Details  Name: DANEA MANTER MRN: [id number] Date of Birth: 07/11/1961  Subjective/Objective:   CM following for progression and d/c planning.                 Action/Plan: 09/30/2015 Noted need for VAC and plan to d/c when [facility] and VAC available. Please be aware that we are unable to obtain a VAC or arrange [place] if this pt does not have a definate address, and this needs to be confirmed with the persons that she would be living with. The pt is unable to confirm this for me, she is tearful and states that she just wants to get out of here. She is agreeable to SNF placement. CSW, Kendell Bane, aware of pt situation and will check with CSW assistant director re letter of Guarantee for placement due to cost of the [place]. Await decision.   Expected Discharge Date:  10/02/2015               Expected Discharge Plan:  Skilled Nursing Facility  In-House Referral:  Clinical Social Work  Discharge planning Services  CM Consult  Post Acute Care Choice:  Durable Medical Equipment Choice offered to:     DME Arranged:    DME Agency:     HH Arranged:    [place] Agency:     Status of Service:  In process, will continue to follow  Medicare Important Message Given:    Date Medicare IM Given:    Medicare IM give by:    Date Additional Medicare IM Given:    Additional Medicare Important Message give by:     If discussed at [place] of Stay Meetings, dates discussed:    Additional Comments:  Adron Bene, RN 09/30/2015, 1:19 PM

## 2015-10-01 MED ORDER — ENOXAPARIN SODIUM 30 MG/0.3ML ~~LOC~~ SOLN: 30.0000 mg | SUBCUTANEOUS | Status: DC

## 2015-10-01 NOTE — Care Management Note (Addendum)
Case Management Note  Patient Details  Name: [name] MRN: [id number] Date of Birth: 1961/04/28  Subjective/Objective:     CM following for progression and d/c planning.               Action/Plan: 10/01/2015 Pt discussed with hospital adm in [place] today, per Medical Director, Dr Deirdre Pippins, we are requesting if pt could be d/c with orders for wet to dry dressings vs VAC. As the hospital will most likely need to provide a letter of Guarantee , the addition daily cost of the [facility] would be cost prohabitative. This CM spoke with Pt MD, Dr Juleen China and he has agreed to contact the surgeon re this possible change in the treatment plan. If pt is d/c to SNF we can be assured that the dressings will be changed as ordered and thus support the heal process for this pt.  10/01/2015 Received call from Dr Juleen China and he has spoke with Dr Migdalia Dk, how states that while not ideal the pt can have wet to dry dressings for this wound care. This CM informed CSW, Vito Backers who is currently working on placement for this pt.   Expected Discharge Date:  10/02/2015             Expected Discharge Plan:  Skilled Nursing Facility  In-House Referral:  Clinical Social Work  Discharge planning Services  CM Consult  Post Acute Care Choice:  Durable Medical Equipment Choice offered to:     DME Arranged:    DME Agency:     HH Arranged:    [place] Agency:     Status of Service:  In process, will continue to follow  Medicare Important Message Given:    Date Medicare IM Given:    Medicare IM give by:    Date Additional Medicare IM Given:    Additional Medicare Important Message give by:     If discussed at [place] of Stay Meetings, dates discussed:    Additional Comments:  Adron Bene, RN 10/01/2015, 12:19 PM

## 2015-10-01 NOTE — Progress Notes (Signed)
Subjective: Patient seen and examined this morning on rounds.  No acute events overnight.  Status post day #5 for excisional debridement of skin and soft tissue/fat, placement of Acell and VAC sponge.   Has pain in leg still but no other complaints.  Objective: Vital signs in last 24 hours: Filed Vitals:   09/30/15 1003 09/30/15 1749 09/30/15 2047 10/01/15 0500  BP: 142/65 136/69 150/76 163/78  Pulse: 62 73 70 59  Temp: 98.7 F (37.1 C) 97.8 F (36.6 C) 98.4 F (36.9 C) 98 F (36.7 C)  TempSrc: Oral Oral Oral Oral  Resp: _0 Height:      Weight:   107 lb (48.535 kg)   SpO2: 100% 100% 100% 100%   Weight change: 6 lb 4 oz (2.835 kg)  Intake/Output Summary (Last 24 hours) at 10/01/15 1304 Last data filed at 10/01/15 1213  Gross per 24 hour  Intake    480 ml  Output    451 ml  Net     29 ml   Gen: pleasant female, no acute distress, lying in bed HEENT: nose and ears with vasculitic changes CV: normal rate, regular rhythm, no murmurs, rubs, or gallops Pulmonary: normal effort, CTA bilaterally, no wheezing, rales, or rhonch Skin: Several wounds in various stages of healing throughout.  Extremities: right calf is wrapped in Ace bandage with no drainage through bandage.  Wound vac in place with small amount of bloody drainage.  Left leg wound covered with adhesive bandage.  Lab Results: Basic Metabolic Panel:  Recent Labs Lab 09/25/15 0205 09/25/15 0840  NA 137  --   K 3.7  --   CL 104  --   CO2 24  --   GLUCOSE 96  --   BUN 14  --   CREATININE 0.73  --   CALCIUM 9.0  --   MG  --  1.9  PHOS  --  4.4   Liver Function Tests:  Recent Labs Lab 09/25/15 0840  AST 19  ALT 10*  ALKPHOS 60  BILITOT 0.5  PROT 6.2*  ALBUMIN 2.9*   CBC:  Recent Labs Lab 09/25/15 0205  WBC 4.7  NEUTROABS 3.1  HGB 10.6*  HCT 31.2*  MCV 86.0  PLT 309   Cardiac Enzymes:  Recent Labs Lab 09/25/15 0840  TROPONINI <0.03   Coagulation:  Recent Labs Lab  09/25/15 0840  LABPROT 15.2  INR 1.18    Urinalysis:  Recent Labs Lab 09/25/15 0428  COLORURINE YELLOW  LABSPEC 1.008  PHURINE 7.5  GLUCOSEU NEGATIVE  HGBUR NEGATIVE  BILIRUBINUR NEGATIVE  KETONESUR NEGATIVE  PROTEINUR NEGATIVE  NITRITE NEGATIVE  LEUKOCYTESUR NEGATIVE   Studies/Results: No results found. Assessment/Plan: Active Problems:   Wound infection ([place])   Infected wound ([place])  Recurrent levamisole-induced vasculitis of RLE, with bacterial superinfection  - history of recurrent hospitalizations for infection control and wound care, status post debridement of bilateral thigh lesions by general surgery in 09/2013.  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  She has recently relapsed after approximately three months of abstinence.  It is presumed that she has bacterial superinfection given purulence and odor that was present.  Had normal x-ray and ESR so suspicion for osteomyelitis was low.   - underwent debridement 12/22 with Dr. Migdalia Dk, appreciate their assistance.   - spoke with Dr. Marla Roe yesterday and this morning regarding wound vac and placement issues.  Due to issues regarding placement, daily  wet-to-dry dressings, while not ideal compared to wound vac, would be reasonable given the situation.  CM and CSW informed of this and are drying to get placement arranged.  Otherwise, patient stable for discharge - wound culture 12/21 with abundant Staph aureus, sensitivities returned on 12/24, RESISTANT to TMP-SMX, antibiotics changed to Doxycycline - continue prednisone 59m for vasculitis for 1 more day.  Will proceed with gradual taper of 111mper week until off - continue Doxycycline 10050mID x 7 days.  First day of tx 12/24, plan for 1 week of antibiotics with last day of tx 12/30 - continue Oxycodone IR 5mg29mh prn for severe pain while inpatient, discharge pain control will be ibuprofen and Tylenol  Dispo: Stable  for discharge to SNF when placement found.  The patient does have a current PCP (VishZada Finders) and does need an [facility] follow-up appointment after discharge.  The patient does have transportation limitations that hinder transportation to clinic appointments.   LOS: 6 days   AndrJule Ser 10/01/2015, 1:04 PM

## 2015-10-01 NOTE — Clinical Social Work Note (Signed)
Clinical Social Work Assessment  Patient Details  Name: [name] MRN: [id number] Date of Birth: 06-14-1961  Date of referral:  10/01/15               Reason for consult:  Facility Placement                Permission sought to share information with:  Chartered certified accountant granted to share information::  Yes, Verbal Permission Granted  Name::        Agency::   [facility] SNF's )  Relationship::     Contact Information:   (None at this time.)  Housing/Transportation Living arrangements for the past 2 months:  [place] [employer] for the last two weeks ) Source of Information:  Patient Patient Interpreter Needed:  None Criminal Activity/Legal Involvement Pertinent to Current Situation/Hospitalization:  No - Comment as needed Significant Relationships:  Siblings, Friend Lives with:  Other (Comment) (Boarding house residents ) Do you feel safe going back to the place where you live?  Yes Need for family participation in patient care:  No (Coment)  Care giving concerns:  Pt has concerns about going to the facility as she feels she could do the dressing changes from home.    Social Worker assessment / plan:  Pt has met with the CSW at the bedside. Pt was waiting for CSW to meet with her so that she can go home. Pt feels that she is wasting time here that she wants to do the dressing changes at her home. Pt lives in a boarding home for the last two weeks. The Pt stated that she has a friend that "is a nurse friend" and she feels her friend may be interested in helping her. Pt feels that she does not have the finances to "be going to a facility that won't even change my bandage as much as they should." Pt stated, "I can do better than that." Pt was visibly crying at the thought of going to a facility. Pt believes that she will have more bills and not be able to pay them. Pt feels that she has no choice of what happens to her. CSW encouraged Pt to make the best  decision for herself and that cautioned that she needs to get this taken care of before it escalates further. Pt agreed with CSW and entertained the idea of going to a facility, however would like to speak with the wound care specialists to learn the details of her wound care and make a decision of discharge care. CSW will share information with the unit CSW. CSW will send Pt information to [facility] area [employer]. PASRR was obtained.    Employment status:  Disabled (Comment on whether or not currently receiving Disability) Insurance information:  Medicaid In [place] PT Recommendations:  [place] / Referral to community resources:  [place]  Patient/Family's Response to care:  Pt does not feel it is necessary to go to a SNF for wound care and that she would be able to do the dressing changes at her residence. Pt stays in a boarding home here in [place].   Patient/Family's Understanding of and Emotional Response to Diagnosis, Current Treatment, and Prognosis:  Pt feels that the wound clinic was "the one that messed my leg up." Pt feels that her wound was exacerbated by the debridement that she received at [facility].    Emotional Assessment Appearance:  Appears older than stated  age Attitude/Demeanor/Rapport:  Crying, Reactive Affect (typically observed):  Depressed, Tearful/Crying, Overwhelmed, Frustrated, Sad (Patient stated that she is sad and depressed about her current situation ) Orientation:  Oriented to Self, Oriented to Place, Oriented to  Time, Oriented to Situation Alcohol / Substance use:  Never Used (Denies Hx or current use, however Pt'schart stated that Pt has a ongoing substance problem with cocaine) Psych involvement (Current and /or in the community):  No (Comment)  Discharge Needs  Concerns to be addressed:  Compliance Issues Concerns, Substance Abuse Concerns, Coping/Stress Concerns, Adjustment to Illness,  Financial / Insurance Concerns Readmission within the last 30 days:  Yes Current discharge risk:  Other (Wound care concerns ) Barriers to Discharge:  Other   Pete Pelt 10/01/2015, 6:06 PM

## 2015-10-01 NOTE — Progress Notes (Signed)
Physical Therapy Treatment Patient Details Name: [name] MRN: [id number] DOB: 14-May-1961 Today's Date: 10/01/2015    History of Present Illness Recurrent levamisole-induced vasculitis of RLE, with bacterial superinfection. s/p RLE I&D with VAC placement 12/22.    PT Comments    Pt is demonstrating better tolerance for gait and wbing but still quite painful.  Her plan is to go home despite stairs to climb on one rail, and she feels though this isn't something she can currently tolerate she will be fine there.  PT does not recommend the transition home but pt may plan otherwise.  Follow Up Recommendations  SNF     Equipment Recommendations  Rolling walker with 5" wheels    Recommendations for Other Services       Precautions / Restrictions Precautions Precautions: Fall Restrictions Weight Bearing Restrictions: No    Mobility  Bed Mobility Overal bed mobility: Modified Independent             General bed mobility comments: extra time  Transfers Overall transfer level: Modified independent Equipment used: Rolling walker (2 wheeled) Transfers: Sit to/from [facility] Sit to Stand: Supervision (with PT help with vac device) Stand pivot transfers: Supervision (with help from PT for vac, although pt can hold it with LUE )          Ambulation/Gait Ambulation/Gait assistance: Supervision;Min guard Ambulation Distance (Feet): 75 Feet (2 round trip walks) Assistive device: Rolling walker (2 wheeled)   Gait velocity: reduced Gait velocity interpretation: Below normal speed for age/gender General Gait Details: Hopping pattern on LLE, then touching RLE with pt mainly avoiding WBing.   Stairs            Wheelchair Mobility    Modified Rankin (Stroke Patients Only)       Balance Overall balance assessment: Needs assistance Sitting-balance support: Feet supported Sitting balance-Leahy Scale: Good     Standing balance support:  Bilateral upper extremity supported Standing balance-Leahy Scale: Poor Standing balance comment: fair+ with walker                    Cognition Arousal/Alertness: Awake/alert Behavior During Therapy: WFL for tasks assessed/performed Overall Cognitive Status: Within Functional Limits for tasks assessed                      Exercises General Exercises - Lower Extremity Ankle Circles/Pumps: AROM;Both;10 reps Short Arc Quad: AROM;Both;10 reps    General Comments General comments (skin integrity, edema, etc.): More stable to day and talks about having to climb steps with one rail.  PT educated her about this      Pertinent Vitals/Pain Pain Assessment: 0-10 Pain Score: 8  Faces Pain Scale: Hurts whole lot Pain Location: R lower leg with wound vac Pain Descriptors / Indicators: Burning Pain Intervention(s): Limited activity within patient's tolerance;Monitored during session;Premedicated before session;Repositioned;Other (comment) (Pt would not do more than TDWB on RLE)    Home Living                      Prior Function            PT Goals (current goals can now be found in the care plan section) Acute Rehab PT Goals Patient Stated Goal: Go home Progress towards PT goals: Progressing toward goals    Frequency  Min 3X/week    PT Plan Current plan remains appropriate    Co-evaluation  End of Session Equipment Utilized During Treatment: Gait belt Activity Tolerance: Patient limited by pain Patient left: in bed;with call bell/phone within reach;with bed alarm set;with SCD's reapplied     Time: 3353-3174 PT Time Calculation (min) (ACUTE ONLY): 27 min  Charges:  $Gait Training: 8-22 mins $Therapeutic Exercise: 8-22 mins                    G Codes:      Ramond Dial October 05, 2015, 1:40 PM   Mee Hives, PT MS Acute Rehab Dept. Number: [facility] [id number] and [place] [phone]

## 2015-10-01 NOTE — Progress Notes (Signed)
   10/01/15 1000  Clinical Encounter Type  Visited With Patient not available  Patient sleeping soundly, will return at another time.

## 2015-10-01 NOTE — NC FL2 (Signed)
[place] LEVEL OF CARE SCREENING TOOL     IDENTIFICATION  Patient Name: [name] Birthdate: 04-29-1961 Sex: female Admission Date (Current Location): 09/25/2015  [place] and [place] Number:  Kathleen Argue [id number] [place] and Address:  [facility] [place] [place] [address], [place], [place] [address]      Provider Number: [id number]  Attending Physician Name and Address:  Axel Filler, MD  Relative Name and Phone Number:       Current Level of Care: Hospital Recommended Level of Care: [place] Prior Approval Number:    Date Approved/Denied: 02/14/10 PASRR Number:  [id number])  Discharge Plan: SNF    Current Diagnoses: Patient Active Problem List   Diagnosis Date Noted  . Infected wound ([place])   . Abscess   . Leg ulcer ([place])   . Wound infection ([place]) 06/14/2015  . Leg wound, left   . Skin ulcer ([place]) 06/04/2015  . Dysuria 04/29/2015  . Pressure ulcer 04/22/2015  . Skin necrosis 04/21/2015  . Other chest pain   . Chest pain 04/07/2015  . Foot infection 03/09/2015  . Protein-calorie malnutrition, severe ([place]) 01/15/2015  . Cocaine abuse   . Swelling of finger, right 01/14/2015  . Foot ulceration ([place]) 01/14/2015  . Severe recurrent major depression without psychotic features ([place]) 10/16/2014  . Cocaine use disorder, severe, dependence ([place]) 10/10/2014  . Cannabis use disorder, severe, dependence ([place]) 10/10/2014  . Substance or medication-induced depressive disorder with onset during intoxication ([place])   . Substance induced mood disorder ([place]) 10/09/2014  . Major depressive disorder, single episode, severe without psychotic features ([place])   . 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 Vasculitis ([place]) 04/19/2014  . Hepatomegaly 04/11/2014  . Acute pericarditis 03/31/2014  . Major depression, recurrent ([place]) 01/13/2014  . Inflammatory arthritis ([place]) 09/24/2013  . 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 Cocaine dependence ([place]) 05/15/2013  . Tobacco abuse 01/03/2013  . hyperlipidemia 01/03/2013  . Essential hypertension 02/26/2010    Orientation RESPIRATION BLADDER Height & Weight    Self, Time, Situation, Place  Normal Continent   107 lbs.  BEHAVIORAL SYMPTOMS/MOOD NEUROLOGICAL BOWEL NUTRITION STATUS      Continent Diet (Regular )  AMBULATORY STATUS COMMUNICATION OF NEEDS Skin   Extensive Assist Verbally Other (Comment), Surgical wounds (Wet to Dry dressing Lt Lower leg)                       Personal Care Assistance Level of Assistance  Bathing, Feeding, Dressing Bathing Assistance: Limited assistance Feeding assistance: Independent Dressing Assistance: Maximum assistance     Functional Limitations Info             SPECIAL CARE FACTORS FREQUENCY                       Contractures Contractures Info: Not present    Additional Factors Info  Code Status, Allergies Code Status Info:  (Full Code ) Allergies Info:  (Acetaminophen)           Current Medications (10/01/2015):  This is the current hospital active medication list Current Facility-Administered Medications  Medication Dose Route Frequency Provider Last Rate Last Dose  . aspirin EC tablet 81 mg  81 mg Oral Daily Rushil Sherrye Payor, MD   81 mg at 10/01/15 0814  . doxycycline (VIBRA-TABS) tablet 100 mg  100 mg Oral Q12H Jule Ser, DO   100 mg at 10/01/15 3007  . enoxaparin (LOVENOX)  injection 30 mg  30 mg Subcutaneous Q24H Rolla Flatten, [facility]      . oxyCODONE (Oxy IR/ROXICODONE) immediate release tablet 5 mg  5 mg Oral Q4H PRN Riccardo Dubin, MD   5 mg at 10/01/15 1145  . predniSONE (DELTASONE) tablet 60 mg  60 mg Oral Q breakfast Norval Gable, MD   60 mg at 10/01/15 0722  . simvastatin (ZOCOR) tablet 20 mg  20 mg Oral q1800 Rushil Sherrye Payor, MD   20 mg at 09/30/15 1756     Discharge Medications: Please see discharge summary for a list of discharge  medications.  Relevant Imaging Results:  Relevant Lab Results:   Additional Information  (SSN #  [id number])  Pete Pelt

## 2015-10-01 NOTE — Progress Notes (Signed)
   10/01/15 1600  Clinical Encounter Type  Visited With Patient  Visit Type Spiritual support  Referral From Nurse  Spiritual Encounters  Spiritual Needs Emotional;Prayer  Stress Factors  Patient Stress Factors Health changes;Loss of control;Other (Comment);Financial concerns (Patient anxious about bills piling up for medical care.)  Patient tearful, frustrated with being unable to walk or go outside or smoke a cigarette. Said she had been in hospital too long and wanted to go home to her routine. Talked to case manager and unit director about getting her taken outside for fresh air.

## 2015-10-01 NOTE — Clinical Social Work Placement (Signed)
   CLINICAL SOCIAL WORK PLACEMENT  NOTE  Date:  10/01/2015  Patient Details  Name: [name] MRN: [id number] Date of Birth: [birth year]  Clinical Social Work is seeking post-discharge placement for this patient at the [place] level of care (*CSW will initial, date and re-position this form in  chart as items are completed):  Yes   Patient/family provided with [place] Work Department's list of facilities offering this level of care within the geographic area requested by the patient (or if unable, by the patient's family).  Yes   Patient/family informed of their freedom to choose among providers that offer the needed level of care, that participate in Medicare, Medicaid or managed care program needed by the patient, have an available bed and are willing to accept the patient.  Yes   Patient/family informed of [place]'s ownership interest in [facility] and [facility], as well as of the fact that they are under no obligation to receive care at these facilities.  PASRR submitted to EDS on 10/01/15     PASRR number received on 10/01/15     Existing PASRR number confirmed on       FL2 transmitted to all facilities in geographic area requested by pt/family on       FL2 transmitted to all facilities within larger geographic area on       Patient informed that his/her managed care company has contracts with or will negotiate with certain facilities, including the following:        No   Patient/family informed of bed offers received.  Patient chooses bed at       Physician recommends and patient chooses bed at      Patient to be transferred to   on  .  Patient to be transferred to facility by       Patient family notified on   of transfer.  Name of family member notified:        PHYSICIAN       Additional Comment:    _______________________________________________ Pete Pelt 10/01/2015, 6:20 PM

## 2015-10-02 MED ORDER — ACETAMINOPHEN 500 MG PO TABS: 500.0000 mg | ORAL_TABLET | Freq: Four times a day (QID) | ORAL | Status: DC | PRN

## 2015-10-02 MED ORDER — PREDNISONE 10 MG PO TABS: ORAL_TABLET | ORAL | Status: DC

## 2015-10-02 MED ORDER — DOXYCYCLINE HYCLATE 100 MG PO TABS: 100.0000 mg | ORAL_TABLET | Freq: Two times a day (BID) | ORAL | Status: AC

## 2015-10-02 NOTE — Care Management Note (Signed)
Case Management Note  Patient Details  Name: [name] MRN: [id number] Date of Birth: 1961-06-30  Subjective/Objective:        CM following for progression and d/c planning.            Action/Plan: Pt now refusing SNF, plan is for wet to dry dressings. HHRN will be provided by [facility] . There is concern that this pt will be noncompliant however she refuses any SNF services.   Expected Discharge Date:  10/02/2015              Expected Discharge Plan:  [place]  In-House Referral:  Clinical Social Work  Discharge planning Services  CM Consult  Post Acute Care Choice:  NA, Home Health Choice offered to:     DME Arranged:  N/A DME Agency:  NA  HH Arranged:  RN [place] Agency:  Other - See comment  Status of Service:  Completed, signed off  Medicare Important Message Given:    Date Medicare IM Given:    Medicare IM give by:    Date Additional Medicare IM Given:    Additional Medicare Important Message give by:     If discussed at [place] of Stay Meetings, dates discussed:    Additional Comments:  Adron Bene, RN 10/02/2015, 2:01 PM

## 2015-10-02 NOTE — Discharge Instructions (Signed)
Please finish your prednisone and the antibiotic course as instructed  Please follow up in our clinic on January 18th at the given time Please stop doing cocaine Please continue with the wet to dry dressings

## 2015-10-02 NOTE — Progress Notes (Signed)
Pt is refusing to go to a SNF at discharge. States she has a home to go to. Will page MD.

## 2015-10-02 NOTE — Progress Notes (Addendum)
Pt provided with discharge instructions including information on follow up appointments and new medications. Pt verbalized understanding of all information. Pt also verbalized understanding to follow up with home health services to provide wound care. All dressings changed before discharge. Pt was escorted out by this RN

## 2015-10-02 NOTE — Progress Notes (Signed)
Subjective: Patient seen and examined this morning  There were no acute events.  Status post day #6 for excisional debridement of skin and soft tissue/fat, placement of Acell and VAC sponge.      Objective: Vital signs in last 24 hours: Filed Vitals:   10/01/15 1807 10/01/15 2145 10/02/15 0535 10/02/15 0827  BP: 162/74 140/72 153/80 155/74  Pulse: 61 74 58 63  Temp: 98 F (36.7 C) 97.9 F (36.6 C) 97.7 F (36.5 C) 98.1 F (36.7 C)  TempSrc: Oral Oral Oral Oral  Resp: _0 Height:      Weight:  106 lb 12.8 oz (48.444 kg)    SpO2: 99% 100% 100% 100%   Weight change: -3.2 oz (-0.091 kg)  Intake/Output Summary (Last 24 hours) at 10/02/15 3893 Last data filed at 10/02/15 0827  Gross per 24 hour  Intake   1070 ml  Output    300 ml  Net    770 ml   Gen: pleasant female, no acute distress, lying in bed CV: normal rate, regular rhythm, no murmurs, rubs, or gallops Pulmonary: normal effort, CTA bilaterally, no wheezing, rales, or rhonch Skin: Several wounds in various stages of healing throughout.  Extremities: right calf is wrapped in Ace bandage with no drainage through bandage.  Wound vac in place with small amount of bloody drainage.  Left leg wound covered with adhesive bandage.  Lab Results: Basic Metabolic Panel: No results for input(s): NA, K, CL, CO2, GLUCOSE, BUN, CREATININE, CALCIUM, MG, PHOS in the last 168 hours. Liver Function Tests: No results for input(s): AST, ALT, ALKPHOS, BILITOT, PROT, ALBUMIN in the last 168 hours. CBC: No results for input(s): WBC, NEUTROABS, HGB, HCT, MCV, PLT in the last 168 hours. Cardiac Enzymes: No results for input(s): CKTOTAL, CKMB, CKMBINDEX, TROPONINI in the last 168 hours. Coagulation: No results for input(s): LABPROT, INR in the last 168 hours.  Urinalysis: No results for input(s): COLORURINE, LABSPEC, PHURINE, GLUCOSEU, HGBUR, BILIRUBINUR, KETONESUR, PROTEINUR, UROBILINOGEN, NITRITE, LEUKOCYTESUR in the last 168  hours.  Invalid input(s): APPERANCEUR Studies/Results: No results found. Assessment/Plan: Active Problems:   Wound infection ([place])   Infected wound ([place])  Recurrent levamisole-induced vasculitis of RLE, with bacterial superinfection  - history of recurrent hospitalizations for infection control and wound care, status post debridement of bilateral thigh lesions by general surgery in 09/2013.  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  She has recently relapsed after approximately three months of abstinence.  It is presumed that she has bacterial superinfection given purulence and odor that was present.  Had normal x-ray and ESR so suspicion for osteomyelitis was low.    - underwent debridement 12/22 with Dr. Migdalia Dk, appreciate their assistance.  She is POD6 -trying to reach social worker to determine placement  - Dr Juleen China spoke with Dr. Marla Roe yesterday regarding wound vac and placement issues.  Due to issues regarding placement, daily wet-to-dry dressings, while not ideal compared to wound vac, would be reasonable given the situation.  CM and CSW informed of this and are trying to get placement arranged.  Otherwise, patient stable for discharge  - wound culture 12/21 with abundant Staph aureus, sensitivities returned on 12/24, RESISTANT to TMP-SMX, antibiotics changed to Doxycycline - continue prednisone 83m for vasculitis for 1 more day.  Will proceed with gradual taper of 140mper week until off - continue Doxycycline 10042mID x 7 days.  First day of tx 12/24, plan for 1  week of antibiotics with last day of tx 12/30 - continue Oxycodone IR 35m q4h prn for severe pain while inpatient, discharge pain control will be ibuprofen and Tylenol  Dispo: Stable for discharge to SNF when placement found.  The patient does have a current PCP (VZada Finders MD) and [facility].  The patient does  have transportation limitations that hinder transportation to clinic appointments.   LOS: 7 days   PBurgess Estelle MD 10/02/2015, 9:26 AM

## 2015-10-02 NOTE — Progress Notes (Signed)
Discussed with the patient the risks and benefits of going to SNF vs going home, and   Now Patient is amenable to going to SNF Discussed with social worker who was not aware that the patient was supposed to be discharged to SNF/ Pt is stable for discharge and the discharge orders are in.

## 2015-10-02 NOTE — Discharge Summary (Signed)
Name: [name] MRN: [id number] DOB: 1961-06-10 54 y.o. PCP: Zada Finders, MD  Date of Admission: 09/25/2015  1:47 AM Date of Discharge: 10/02/2015 Attending Physician: Aldine Contes, MD  Discharge Diagnosis: 1. Levamisole induced vasculitis Active Problems:   Wound infection ([place])   Infected wound ([place])  Discharge Medications:   Medication List    STOP taking these medications        amoxicillin-clavulanate 875-125 MG tablet  Commonly known as:  AUGMENTIN     benzonatate 200 MG capsule  Commonly known as:  TESSALON     methocarbamol 500 MG tablet  Commonly known as:  ROBAXIN     sulfamethoxazole-trimethoprim 800-160 MG tablet  Commonly known as:  BACTRIM DS,SEPTRA DS      TAKE these medications        acetaminophen 500 MG tablet  Commonly known as:  TYLENOL  Take 1 tablet (500 mg total) by mouth every 6 (six) hours as needed.     acyclovir 800 MG tablet  Commonly known as:  ZOVIRAX  Take 1 tablet (800 mg total) by mouth 5 (five) times daily.     aspirin 81 MG EC tablet  Take 1 tablet (81 mg total) by mouth daily.     doxycycline 100 MG tablet  Commonly known as:  VIBRA-TABS  Take 1 tablet (100 mg total) by mouth every 12 (twelve) hours.     ibuprofen 600 MG tablet  Commonly known as:  ADVIL,MOTRIN  Take 1 tablet (600 mg total) by mouth every 6 (six) hours as needed for fever, headache, mild pain, moderate pain or cramping.     lisinopril 10 MG tablet  Commonly known as:  PRINIVIL,ZESTRIL  Take 1 tablet (10 mg total) by mouth daily.     oxyCODONE 5 MG immediate release tablet  Commonly known as:  ROXICODONE  Take 1 tablet (5 mg total) by mouth every 4 (four) hours as needed for severe pain.     predniSONE 10 MG tablet  Commonly known as:  DELTASONE  5 tabs daily until 1/5, then 4 tabs daily from 1/6-1/12, then 3 tabs daily 1/13-1/19, then 2 tabs daily 1/20-1/27, then 1 tab 1/28 to 2/2     silver sulfADIAZINE 1 % cream  Commonly known as:   SILVADENE  Apply topically 2 (two) times daily.     simvastatin 20 MG tablet  Commonly known as:  ZOCOR  Take 1 tablet (20 mg total) by mouth daily at 6 PM.        Disposition and follow-up:   Ms.Malori D Sliwinski was discharged from [facility] in Stable condition.  At the hospital follow up visit please address:  1.  patient needs to complete 7 day course of antibiotics, previously on Bactrim, however, wound culture sensitivities returned 12/24 were RESISTANT to Bactrim. Antibiotic switched to Doxycycline 15m BID. First day of tx 12/24. Last day of tx 12/30. - please perform daily wet-to-dry dressing changes - patient needs to complete prolonged prednisone taper: 673mx 7 days, 5047m 7 days, 74m54m7 days, 30mg77m days, 20mg 23mdays, 10mg x76mays, then off. She has already completed 7 days of 60mg at60me of discharge.  - pain control: patient discharged on Ibuprofen and Tylenol for pain control. She did receive Oxycodone as needed while inpatient, decided against discharging on controlled medication given her significant history of ongoing substance abuse and being almost 1 week post-op at discharge. - please  schedule patient for hospital follow up with her PCP. - please arrange for wound care center appointment in 1-2 weeks.  2.  Labs / imaging needed at time of follow-up: none 3.  Pending labs/ test needing follow-up: none  Follow-up Appointments:     Follow-up Information    Schedule an appointment as soon as possible for a visit with Zada Finders, MD.   Specialty:  Internal Medicine   Why:  they will call you to schedule an appointment.  if you do not hear from them by the end of the week, please call them.   Contact information:   [place] [address] [phone]       Follow up with [facility]             . Schedule an appointment as soon as possible for a visit in 1 week.   Contact  information:   [address] [place] [phone]      Go to Sherin Quarry, MD.   Specialty:  Internal Medicine   Why:  january 18th 10:15 AM for hospital follow up     Contact information:   [address] [place] [address] [phone]       Discharge Instructions: Discharge Instructions    Diet - low sodium heart healthy    Complete by:  As directed      Discharge instructions    Complete by:  As directed   Please finish the prednisone course- the instructions are written on the bottle- please ask the pharmacist if you have any questions. Please finish your antibiotic Please STOP using cocaine- you know by now that it is causing all of your wounds.     Increase activity slowly    Complete by:  As directed            Consultations:    Procedures Performed:  Dg Chest 2 View  09/25/2015  CLINICAL DATA:  54 year old female with chest pain EXAM: CHEST  2 VIEW COMPARISON:  Chest radiograph dated 09/07/2015 FINDINGS: The heart size and mediastinal contours are within normal limits. There is apparent lung tissue anterior to the mediastinal on the lateral projection, likely related to patient positioning. Both lungs are clear. The visualized skeletal structures are unremarkable. IMPRESSION: No active cardiopulmonary disease. Electronically Signed   By: Anner Crete M.D.   On: 09/25/2015 03:12   Dg Chest 2 View  09/07/2015  CLINICAL DATA:  Productive cough EXAM: CHEST  2 VIEW COMPARISON:  04/21/2015 chest radiograph. FINDINGS: Stable cardiomediastinal silhouette with normal heart size. No pneumothorax. No pleural effusion. Stable minimal scarring in the mid to upper right lung. No new focal lung opacity. No pulmonary edema. IMPRESSION: No active cardiopulmonary disease. Electronically Signed   By: Ilona Sorrel M.D.   On: 09/07/2015 17:20   Dg Tibia/fibula Right  09/25/2015  CLINICAL DATA:  54 year old female with possible  infection of the right lower extremity. EXAM: RIGHT TIBIA AND FIBULA - 2 VIEW COMPARISON:  None. FINDINGS: There is no acute fracture or dislocation. There is irregularity of the superficial soft tissues of the posterior calf with small pockets of subcutaneous appearing air. Clinical correlation is recommended. No radiopaque foreign object identified. IMPRESSION: Irregularity of the skin with small pockets of gas in the subcutaneous soft tissues of the posterior calf concerning for infection. Clinical correlation is recommended. Electronically Signed   By: Laren Everts.D.  On: 09/25/2015 03:15    2D Echo:   Cardiac Cath:   Admission HPI:  This is a 54 yo woman with history of cocaine abuse leading to levimasole induced vasculitis, depression and hypertension with several admissions in the past for levimasole vasculitis who came with wound on her right leg, and also chest pain onset 11 PM.   She says that she goes to wound care at [facility] and went there last week- they debrided it last week and put bandage around it. She did not remove the bandage until today when she noticed a smell coming from it. She says that the wound is draining blood, but no purulent discharge. She also noticed some swelling in the leg since last week. She complains that she was not given pain medicines or antibiotics at her last appointment last week. She denies fevers but endorses chills, no n/v/d/c. She complains of 10/10 pain. She has monthly appointments at [facility]. She was last seen in our clinic on the Nov 18th and per note, she does wound care at home and had silvadene cream and wound care supplies.   She says that she uses cocaine and smokes "a dime or two"- she last used it 2 days ago. No IV drug use. She also uses THC. She smokes 4 cigs per day since the age of 5. No alcohol use . She reports being sexually active with one partner occasionally.    Regarding chest pain, she reports it as sharp  sometimes radiating to left arm, constantly present which started when she was walking from the bus stand to the friend's house at around 11 PM. Currently the pain subsided but it is still present. Sh e denies dizziness or lightheadedness.She denies palpitations.   Patient also reports folliculitis flare draining serosanguinous fluid in her axillae. Sometimes she will try to pinch the follicles to get them to pop. She also reports a "shingles" flare in her both axilla which does not look shingles to me. She states she gets these flares every month, when her axilla becomes red, odorous, and the wounds drain yellow pus. She describes the pain as achy and itchy.  Her living situation is stable right now and she has a room which she shares with other room mates and she feels safe at home.    Hospital Course by problem list: Active Problems:   Wound infection ([place])   Infected wound ([place])   1. Recurrent levamisole-induced vasculitis of RLE, with bacterial superinfection: patient with extensive history of recurrent hospitalizations for infection control and wound care, status post debridement of bilateral thigh lesions by general surgery in 09/2013. 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 She has recently relapsed after approximately three months of abstinence. It is presumed that she has bacterial superinfection given purulence and odor that was present. Had normal x-ray and ESR so suspicion for osteomyelitis was low.She underwent debridement 12/22 with Dr. Migdalia Dk and appreciated their assistance. We obtained wound culture 12/21 with abundant Staph aureus, sensitivities returned 12/24 were resistant to TMP-SMX, which patient had been on since 12/21. We switched her antibiotics to Doxycycline 184m BID on 12/24 to complete 7 days from that point. We followed wound care recommendations. We started prednisone 671mfor vasculitis. Will proceed with  gradual taper of 1068mer week until off. At discharge to SNF, patient discharged on Ibuprofen and Tylenol for pain control. She did receive Oxycodone as needed while inpatient, decided against discharging on controlled  medication given her significant history of ongoing substance abuse and being almost 1 week post-op at discharge. PT evaluation recommended SNF placement, patient agreeable to this. Patient discharged to SNF.  - patient needs to complete 7 day course of antibiotics, previously on Bactrim, however, wound culture sensitivities returned 12/24 were RESISTANT to Bactrim. Antibiotic switched to Doxycycline 191m BID. First day of tx 12/24. Last day of tx 12/30. - patient needs to complete prolonged prednisone taper: 617mx 7 days, 5030m 7 days, 27m49m7 days, 30mg54m days, 20mg 67mdays, 10mg x79mays, then off. She has already completed 7-8 days of 60mg at36me of discharge. - pain control: patient discharged on Ibuprofen and Tylenol for pain control. She did receive Oxycodone as needed while inpatient, decided against discharging on controlled medication given her significant history of ongoing substance abuse and being almost 1 week post-op at discharge.  - please schedule patient for hospital follow up with her PCP. - please arrange for wound care center appointment in 1-2 weeks.   Discharge Vitals:   BP 155/74 mmHg  Pulse 63  Temp(Src) 98.1 F (36.7 C) (Oral)  Resp 17  Ht 5' 2" (1.575 m)  Wt 106 lb 12.8 oz (48.444 kg)  BMI 19.53 kg/m2  SpO2 100%  Discharge Labs:  No results found for this or any previous visit (from the past 24 hour(s)).  Signed: Ammy Lienhard SaBurgess Estelle28/2016, 11:39 AM    Services Ordered on Discharge: SNF Equipment Ordered on Discharge: rolling walker with 5" wheels

## 2015-10-02 NOTE — Care Management (Addendum)
[facility], [facility] notified of change in wound care for this pt. The wound will not longer be treated with wet to dry dressings but with the A-Cell and an adaptic in the wound, the wound will be covered with lubricant jelly and dressed with 4x4s, ABDs and wrapped with kerlix daily.

## 2015-10-02 NOTE — Consult Note (Addendum)
WOC consult: Bedside nurse was removing Vac dressing to right leg for patient to be discharged home and noted that the dressing underneath was sutured in place.  They attempted to contact Plastic surgery for further dressing change orders but that team was in the OR and unable to answer their calls. WOC called to replace Vac dressing until further orders could be obtained for impending discharge without the Vac.  Gently removed the Vac black sponge, leaving the adaptic dressing in place over 2 leg wound locations, which were not visualized.  Plastics team called the bedside nurse at that time and ordered wound gel to be applied over the adaptic, then gauze dressing and kerlex.  Applied dressings as ordered.  Please refer to Plastics team for further plan of care and questions. Please re-consult if further assistance is needed.  Thank-you,  Julien Girt MSN, [place], [place], [place], [place]

## 2015-10-02 NOTE — Progress Notes (Addendum)
  [name] and I went in person- patient is not willing to go to SNF outside [place], which is the only option available She will be discharged home with home health

## 2015-10-13 ENCOUNTER — Emergency Department (HOSPITAL_COMMUNITY)
Admission: EM | Admit: 2015-10-13 | Discharge: 2015-10-13 | Disposition: A | Discharge status: Home / Self Care | Payer: Medicaid Other | Attending: Emergency Medicine | Admitting: Emergency Medicine

## 2015-10-13 ENCOUNTER — Encounter (HOSPITAL_COMMUNITY): Payer: Self-pay | Admitting: Emergency Medicine

## 2015-10-13 DIAGNOSIS — L97911 Non-pressure chronic ulcer of unspecified part of right lower leg limited to breakdown of skin: Secondary | ICD-10-CM | POA: Insufficient documentation

## 2015-10-13 DIAGNOSIS — Z7952 Long term (current) use of systemic steroids: Secondary | ICD-10-CM | POA: Diagnosis not present

## 2015-10-13 DIAGNOSIS — M1990 Unspecified osteoarthritis, unspecified site: Secondary | ICD-10-CM | POA: Insufficient documentation

## 2015-10-13 DIAGNOSIS — S81801A Unspecified open wound, right lower leg, initial encounter: Secondary | ICD-10-CM | POA: Diagnosis present

## 2015-10-13 DIAGNOSIS — I10 Essential (primary) hypertension: Secondary | ICD-10-CM | POA: Insufficient documentation

## 2015-10-13 DIAGNOSIS — Z8669 Personal history of other diseases of the nervous system and sense organs: Secondary | ICD-10-CM | POA: Diagnosis not present

## 2015-10-13 DIAGNOSIS — Y9389 Activity, other specified: Secondary | ICD-10-CM | POA: Diagnosis not present

## 2015-10-13 DIAGNOSIS — M069 Rheumatoid arthritis, unspecified: Secondary | ICD-10-CM | POA: Insufficient documentation

## 2015-10-13 DIAGNOSIS — Z8701 Personal history of pneumonia (recurrent): Secondary | ICD-10-CM | POA: Diagnosis not present

## 2015-10-13 DIAGNOSIS — Y9289 Other specified places as the place of occurrence of the external cause: Secondary | ICD-10-CM | POA: Diagnosis not present

## 2015-10-13 DIAGNOSIS — X58XXXA Exposure to other specified factors, initial encounter: Secondary | ICD-10-CM | POA: Diagnosis not present

## 2015-10-13 DIAGNOSIS — Y998 Other external cause status: Secondary | ICD-10-CM | POA: Insufficient documentation

## 2015-10-13 DIAGNOSIS — Z862 Personal history of diseases of the blood and blood-forming organs and certain disorders involving the immune mechanism: Secondary | ICD-10-CM | POA: Diagnosis not present

## 2015-10-13 DIAGNOSIS — Z8659 Personal history of other mental and behavioral disorders: Secondary | ICD-10-CM | POA: Insufficient documentation

## 2015-10-13 DIAGNOSIS — F17210 Nicotine dependence, cigarettes, uncomplicated: Secondary | ICD-10-CM | POA: Insufficient documentation

## 2015-10-13 LAB — BASIC METABOLIC PANEL
ANION GAP: 12 (ref 5–15)
BUN: 18 mg/dL (ref 6–20)
CHLORIDE: 106 mmol/L (ref 101–111)
CO2: 21 mmol/L — AB (ref 22–32)
Calcium: 9.1 mg/dL (ref 8.9–10.3)
Creatinine, Ser: 0.75 mg/dL (ref 0.44–1.00)
GFR calc Af Amer: >60 mL/min (ref >60)
GFR calc non Af Amer: >60 mL/min (ref >60)
GLUCOSE: 196 mg/dL — AB (ref 65–99)
POTASSIUM: 3.3 mmol/L — AB (ref 3.5–5.1)
Sodium: 139 mmol/L (ref 135–145)

## 2015-10-13 LAB — CBC WITH DIFFERENTIAL/PLATELET
BASOS ABS: 0 10*3/uL (ref 0.0–0.1)
Basophils Relative: 0 %
Eosinophils Absolute: 0 10*3/uL (ref 0.0–0.7)
Eosinophils Relative: 0 %
HEMATOCRIT: 35.6 % — AB (ref 36.0–46.0)
HEMOGLOBIN: 12 g/dL (ref 12.0–15.0)
LYMPHS PCT: 10 %
Lymphs Abs: 1.3 10*3/uL (ref 0.7–4.0)
MCH: 28.7 pg (ref 26.0–34.0)
MCHC: 33.7 g/dL (ref 30.0–36.0)
MCV: 85.2 fL (ref 78.0–100.0)
MONO ABS: 0.5 10*3/uL (ref 0.1–1.0)
Monocytes Relative: 4 %
NEUTROS ABS: 11.1 10*3/uL — AB (ref 1.7–7.7)
NEUTROS PCT: 86 %
Platelets: 329 10*3/uL (ref 150–400)
RBC: 4.18 MIL/uL (ref 3.87–5.11)
RDW: 14.2 % (ref 11.5–15.5)
WBC: 12.9 10*3/uL — ABNORMAL HIGH (ref 4.0–10.5)

## 2015-10-13 LAB — URINALYSIS, ROUTINE W REFLEX MICROSCOPIC
BILIRUBIN URINE: NEGATIVE
GLUCOSE, UA: NEGATIVE mg/dL
HGB URINE DIPSTICK: NEGATIVE
Ketones, ur: NEGATIVE mg/dL
Leukocytes, UA: NEGATIVE
NITRITE: NEGATIVE
Protein, ur: NEGATIVE mg/dL
SPECIFIC GRAVITY, URINE: 1.03 (ref 1.005–1.030)
pH: 6 (ref 5.0–8.0)

## 2015-10-13 MED ORDER — MORPHINE SULFATE (PF) 4 MG/ML IV SOLN
4.0000 mg | Freq: Once | INTRAVENOUS | Status: AC
  Administered 2015-10-13: 4 mg via INTRAVENOUS
  Filled 2015-10-13: qty 1

## 2015-10-13 NOTE — ED Notes (Signed)
The patient was given 4x4 gauze and curlex to change her dressing.

## 2015-10-13 NOTE — ED Provider Notes (Addendum)
CSN: [id number]     Arrival date & time 10/13/15  0411 History   First MD Initiated Contact with Patient 10/13/15 0421     Chief Complaint  Patient presents with  . Wound Infection     (Consider location/radiation/quality/duration/timing/severity/associated sxs/prior Treatment) HPI Comments: Pt with hx of vasculitis, CAP, HTN, cocaine abuse and chronic leg wound that was recently debrided comes in with leg pain. Her dressing was sutured to the skin and the home RN was suppsed to check on it and dress it - but no one ever came. She reports that her pain has gotten worse. She has no n/v/f/c. Pt states that she took her antibiotics as prescribed.   The history is provided by the patient.    Past Medical History  Diagnosis Date  . Vasculitis ([place])     2/2 Levimasole toxicity. Followed by Dr. Louanne Skye  . Hypertension   . Cocaine abuse     ongoing with resultant vaculitis.  . Depression   . Normocytic anemia     BL Hgb 9.8-12. Last anemia panel 04/2010 - showing Fe 19, ferritin 101.  Pt on monthly B12 injections  . 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 CAP (community acquired pneumonia) 03/2014 X 2  . Daily headache   . Migraines     "probably 5-6/yr" (04/21/2014)  . Rheumatoid arthritis(714.0)     patient reported  . Inflammatory arthritis [facility])    Past Surgical History  Procedure Laterality Date  . Skin biopsy Bilateral 04/2010    shin nodules  . Irrigation and debridement abscess Bilateral 09/26/2013    Procedure: DEBRIDEMENT ULCERS BILATERAL THIGHS;  Surgeon: Gwenyth Ober, MD;  Location: [facility];  Service: General;  Laterality: Bilateral;  . Hernia repair      "stomach"  . I&d extremity Right 09/26/2015    Procedure: IRRIGATION AND DEBRIDEMENT LEG WOUND  VAC PLACEMENT.;  Surgeon: Loel Lofty Dillingham, DO;  Location: [facility];  Service: Plastics;  Laterality: Right;   Family History  Problem Relation Age of Onset  . Breast cancer Mother     Breast  cancer  . Alcohol abuse Mother   . Colon cancer Maternal Aunt 84  . Alcohol abuse Father    Social History  Substance Use Topics  . Smoking status: Current Every Day Smoker -- 0.30 packs/day for 34 years    Types: Cigarettes  . Smokeless tobacco: Never Used     Comment: "Trying very hard". / 4-5 CIGARETTES A DAY  . Alcohol Use: No   OB History    No data available     Review of Systems  Constitutional: Positive for activity change.  Respiratory: Negative for shortness of breath.   Cardiovascular: Negative for chest pain.  Gastrointestinal: Negative for nausea, vomiting and abdominal pain.  Genitourinary: Negative for dysuria.  Musculoskeletal: Negative for neck pain.  Skin: Positive for rash and wound.  Allergic/Immunologic: Positive for immunocompromised state.  Neurological: Negative for headaches.  Hematological: Does not bruise/bleed easily.      Allergies  Acetaminophen  Home Medications   Prior to Admission medications   Medication Sig Start Date End Date Taking? Authorizing Provider  predniSONE (DELTASONE) 10 MG tablet 5 tabs daily until 1/5, then 4 tabs daily from 1/6-1/12, then 3 tabs daily 1/13-1/19, then 2 tabs daily 1/20-1/27, then 1 tab 1/28 to 2/2 10/02/15 11/07/15 Yes Burgess Estelle, MD   BP 152/69 mmHg  Pulse 76  Temp(Src) 98.1  F (36.7 C) (Oral)  Resp 16  Ht 5' 1"  (1.549 m)  SpO2 98% Physical Exam  Constitutional: She is oriented to person, place, and time. She appears well-developed.  HENT:  Head: Normocephalic and atraumatic.  Eyes: EOM are normal.  Neck: Normal range of motion. Neck supple.  Cardiovascular: Normal rate.   Pulmonary/Chest: Effort normal.  Abdominal: Bowel sounds are normal.  Neurological: She is alert and oriented to person, place, and time.  Skin: Skin is warm and dry.  Nursing note and vitals reviewed.        POST REMOVAL OF THE ADAPTIC         ED Course  Debridement Date/Time: 10/13/2015 8:14 AM Performed  by: Varney Biles Authorized by: Varney Biles Consent: Verbal consent obtained. Risks and benefits: risks, benefits and alternatives were discussed Consent given by: patient Required items: required blood products, implants, devices, and special equipment available Patient identity confirmed: verbally with patient and arm band Preparation: Patient was prepped and draped in the usual sterile fashion. Local anesthesia used: no Patient sedated: no Patient tolerance: Patient tolerated the procedure well with no immediate complications Comments: Mild debridment of the skin required.   (including critical care time) Labs Review Labs Reviewed  CBC WITH DIFFERENTIAL/PLATELET - Abnormal; Notable for the following:    WBC 12.9 (*)    HCT 35.6 (*)    Neutro Abs 11.1 (*)    All other components within normal limits  BASIC METABOLIC PANEL - Abnormal; Notable for the following:    Potassium 3.3 (*)    CO2 21 (*)    Glucose, Bld 196 (*)    All other components within normal limits  URINALYSIS, ROUTINE W REFLEX MICROSCOPIC (NOT AT [facility]) - Abnormal; Notable for the following:    APPearance HAZY (*)    All other components within normal limits    Imaging Review No results found. I have personally reviewed and evaluated these images and lab results as part of my medical decision-making.   EKG Interpretation None      MDM   Final diagnoses:  Leg wound, right, initial encounter  Leg ulcer, right, limited to breakdown of skin ([place])    Pt comes in with wound evaluation. Spoke with Dr. Marla Roe, Plastics. She recommended removing the sutures dressing and irrigate the wound, and apply wet to dry dressing if no infection. The wound is not infected. We irrigated the wound with sterile water and dressed it.  Will d.c pt with wound care f/u. We have provided her with the dressing material.      Varney Biles, MD 10/13/15 [place], MD 10/13/15 0630

## 2015-10-13 NOTE — ED Notes (Signed)
EDP at bedside  

## 2015-10-13 NOTE — Discharge Instructions (Signed)
Take the dressing supply with you - and apply a layer of wet gauze the wound followed by a dry one. Please see the wound care team as requested - call and set up an appointment.   Wound Debridement, Care After Refer to this sheet in the next few weeks. These instructions provide you with information on caring for yourself after your procedure. Your caregiver may also give you more specific instructions. Your treatment has been planned according to current medical practices, but problems sometimes occur. Call your caregiver if you have any problems or questions after your procedure.  HOME CARE INSTRUCTIONS   Only take over-the-counter or prescription medicines as directed by your caregiver. Do not take any other medicines without asking your caregiver first.  If antibiotic medicines are prescribed, take them as directed. Finish them even if you start to feel better.  Care for your wound as directed by your caregiver.  Wash your hands thoroughly before and after changing your dressings.  Keep the wound and bandages (dressings) clean and dry. Change dressings as directed.  Apply any prescribed medicines to the wound as directed. Do not use anything else without asking your caregiver first.  Do not shower or bathe until your caregiver approves. Take sponge baths until you get approval.  Limit activities or movements as directed by your caregiver.  Protect the wound from further injury until it is healed.  Do not drive until you are no longer taking pain medicine or until your caregiver approves.  Keep all follow-up appointments.  SEEK MEDICAL CARE IF:   You have pain even after taking pain medicine.  You have trouble changing your dressings. SEEK IMMEDIATE MEDICAL CARE IF:  Your pain increases.   You have a fever. MAKE SURE YOU:  Understand these instructions.  Will watch your condition.  Will get help right away if you are not doing well or get worse.   This  information is not intended to replace advice given to you by your health care provider. Make sure you discuss any questions you have with your health care provider.   Document Released: 09/07/2012 Document Revised: 10/12/2014 Document Reviewed: 01/30/2015 Elsevier Interactive Patient Education [employer].

## 2015-10-13 NOTE — ED Notes (Signed)
Per EMS, pt had surgery on 09/25/15 on the right leg to debride a preexisting wound. EMS states that the pt has only changed the dressing once since the surgery. There is a foul odor coming from the wound. Pt with hx of HTN and has not been taking her medications.

## 2015-10-23 ENCOUNTER — Inpatient Hospital Stay (HOSPITAL_COMMUNITY)
Admission: AD | Admit: 2015-10-23 | Discharge: 2015-10-29 | DRG: 917 | Disposition: A | Discharge status: Home / Self Care | Payer: Medicaid Other | Source: Ambulatory Visit | Attending: Oncology | Admitting: Oncology

## 2015-10-23 ENCOUNTER — Ambulatory Visit (INDEPENDENT_AMBULATORY_CARE_PROVIDER_SITE_OTHER): Payer: Medicaid Other | Admitting: Internal Medicine

## 2015-10-23 ENCOUNTER — Encounter: Payer: Self-pay | Admitting: Internal Medicine

## 2015-10-23 VITALS — BP 122/91 | HR 112 | Temp 98.5°F | Wt 95.5 lb

## 2015-10-23 DIAGNOSIS — I999 Unspecified disorder of circulatory system: Secondary | ICD-10-CM

## 2015-10-23 DIAGNOSIS — L88 Pyoderma gangrenosum: Secondary | ICD-10-CM | POA: Diagnosis present

## 2015-10-23 DIAGNOSIS — F17210 Nicotine dependence, cigarettes, uncomplicated: Secondary | ICD-10-CM | POA: Diagnosis present

## 2015-10-23 DIAGNOSIS — F1420 Cocaine dependence, uncomplicated: Secondary | ICD-10-CM | POA: Diagnosis present

## 2015-10-23 DIAGNOSIS — E43 Unspecified severe protein-calorie malnutrition: Secondary | ICD-10-CM | POA: Diagnosis present

## 2015-10-23 DIAGNOSIS — T148XXA Other injury of unspecified body region, initial encounter: Secondary | ICD-10-CM

## 2015-10-23 DIAGNOSIS — T405X1A Poisoning by cocaine, accidental (unintentional), initial encounter: Principal | ICD-10-CM | POA: Diagnosis present

## 2015-10-23 DIAGNOSIS — E785 Hyperlipidemia, unspecified: Secondary | ICD-10-CM | POA: Diagnosis present

## 2015-10-23 DIAGNOSIS — L97219 Non-pressure chronic ulcer of right calf with unspecified severity: Secondary | ICD-10-CM | POA: Diagnosis present

## 2015-10-23 DIAGNOSIS — F339 Major depressive disorder, recurrent, unspecified: Secondary | ICD-10-CM | POA: Diagnosis present

## 2015-10-23 DIAGNOSIS — F14988 Cocaine use, unspecified with other cocaine-induced disorder: Secondary | ICD-10-CM | POA: Diagnosis present

## 2015-10-23 DIAGNOSIS — Z681 Body mass index (BMI) 19 or less, adult: Secondary | ICD-10-CM | POA: Diagnosis not present

## 2015-10-23 DIAGNOSIS — N179 Acute kidney failure, unspecified: Secondary | ICD-10-CM | POA: Diagnosis present

## 2015-10-23 DIAGNOSIS — M069 Rheumatoid arthritis, unspecified: Secondary | ICD-10-CM | POA: Diagnosis present

## 2015-10-23 DIAGNOSIS — E872 Acidosis, unspecified: Secondary | ICD-10-CM

## 2015-10-23 DIAGNOSIS — Z9119 Patient's noncompliance with other medical treatment and regimen: Secondary | ICD-10-CM | POA: Diagnosis not present

## 2015-10-23 DIAGNOSIS — S81802A Unspecified open wound, left lower leg, initial encounter: Secondary | ICD-10-CM | POA: Diagnosis present

## 2015-10-23 DIAGNOSIS — L089 Local infection of the skin and subcutaneous tissue, unspecified: Secondary | ICD-10-CM

## 2015-10-23 DIAGNOSIS — I776 Arteritis, unspecified: Secondary | ICD-10-CM | POA: Diagnosis present

## 2015-10-23 DIAGNOSIS — Z7952 Long term (current) use of systemic steroids: Secondary | ICD-10-CM

## 2015-10-23 DIAGNOSIS — L958 Other vasculitis limited to the skin: Secondary | ICD-10-CM

## 2015-10-23 DIAGNOSIS — Z886 Allergy status to analgesic agent status: Secondary | ICD-10-CM | POA: Diagnosis not present

## 2015-10-23 DIAGNOSIS — I10 Essential (primary) hypertension: Secondary | ICD-10-CM | POA: Diagnosis present

## 2015-10-23 DIAGNOSIS — F14288 Cocaine dependence with other cocaine-induced disorder: Secondary | ICD-10-CM

## 2015-10-23 DIAGNOSIS — Y929 Unspecified place or not applicable: Secondary | ICD-10-CM

## 2015-10-23 LAB — COMPREHENSIVE METABOLIC PANEL
ALK PHOS: 58 U/L (ref 38–126)
ALT: 11 U/L — AB (ref 14–54)
AST: 16 U/L (ref 15–41)
Albumin: 3.1 g/dL — ABNORMAL LOW (ref 3.5–5.0)
Anion gap: 11 (ref 5–15)
BILIRUBIN TOTAL: 0.6 mg/dL (ref 0.3–1.2)
BUN: 26 mg/dL — ABNORMAL HIGH (ref 6–20)
CALCIUM: 9.7 mg/dL (ref 8.9–10.3)
CO2: 29 mmol/L (ref 22–32)
CREATININE: 1.8 mg/dL — AB (ref 0.44–1.00)
Chloride: 99 mmol/L — ABNORMAL LOW (ref 101–111)
GFR, EST AFRICAN AMERICAN: 36 mL/min — AB (ref >60)
GFR, EST NON AFRICAN AMERICAN: 31 mL/min — AB (ref >60)
Glucose, Bld: 149 mg/dL — ABNORMAL HIGH (ref 65–99)
Potassium: 3.5 mmol/L (ref 3.5–5.1)
SODIUM: 139 mmol/L (ref 135–145)
TOTAL PROTEIN: 6.3 g/dL — AB (ref 6.5–8.1)

## 2015-10-23 LAB — CBC WITH DIFFERENTIAL/PLATELET
Basophils Absolute: 0 10*3/uL (ref 0.0–0.1)
Basophils Relative: 0 %
Eosinophils Absolute: 0 10*3/uL (ref 0.0–0.7)
Eosinophils Relative: 0 %
HEMATOCRIT: 39.8 % (ref 36.0–46.0)
HEMOGLOBIN: 13.2 g/dL (ref 12.0–15.0)
LYMPHS ABS: 3 10*3/uL (ref 0.7–4.0)
LYMPHS PCT: 20 %
MCH: 28.6 pg (ref 26.0–34.0)
MCHC: 33.2 g/dL (ref 30.0–36.0)
MCV: 86.1 fL (ref 78.0–100.0)
MONO ABS: 0.7 10*3/uL (ref 0.1–1.0)
MONOS PCT: 4 %
NEUTROS ABS: 11.2 10*3/uL — AB (ref 1.7–7.7)
NEUTROS PCT: 76 %
Platelets: 370 10*3/uL (ref 150–400)
RBC: 4.62 MIL/uL (ref 3.87–5.11)
RDW: 14.3 % (ref 11.5–15.5)
WBC: 14.9 10*3/uL — ABNORMAL HIGH (ref 4.0–10.5)

## 2015-10-23 LAB — LACTIC ACID, PLASMA
LACTIC ACID, VENOUS: 2.9 mmol/L — AB (ref 0.5–2.0)
LACTIC ACID, VENOUS: 1.3 mmol/L (ref 0.5–2.0)
Lactic Acid, Venous: 2 mmol/L (ref 0.5–2.0)

## 2015-10-23 MED ORDER — VANCOMYCIN HCL IN DEXTROSE 1-5 GM/200ML-% IV SOLN
1000.0000 mg | Freq: Once | INTRAVENOUS | Status: AC
  Administered 2015-10-23: 1000 mg via INTRAVENOUS
  Filled 2015-10-23 (×2): qty 200

## 2015-10-23 MED ORDER — IBUPROFEN 200 MG PO TABS
800.0000 mg | ORAL_TABLET | Freq: Once | ORAL | Status: AC
  Administered 2015-10-23: 800 mg via ORAL

## 2015-10-23 MED ORDER — OXYCODONE HCL 5 MG PO TABS
5.0000 mg | ORAL_TABLET | Freq: Four times a day (QID) | ORAL | Status: DC | PRN
  Administered 2015-10-23 – 2015-10-25 (×9): 5 mg via ORAL
  Filled 2015-10-23 (×9): qty 1

## 2015-10-23 MED ORDER — PREDNISONE 20 MG PO TABS
40.0000 mg | ORAL_TABLET | Freq: Every day | ORAL | Status: DC
  Administered 2015-10-24 – 2015-10-29 (×6): 40 mg via ORAL
  Filled 2015-10-23 (×6): qty 2

## 2015-10-23 MED ORDER — DEXTROSE 5 % IV SOLN
1.0000 g | INTRAVENOUS | Status: DC
  Administered 2015-10-23 – 2015-10-28 (×6): 1 g via INTRAVENOUS
  Filled 2015-10-23 (×10): qty 10

## 2015-10-23 MED ORDER — SODIUM CHLORIDE 0.9 % IV BOLUS (SEPSIS)
1000.0000 mL | Freq: Once | INTRAVENOUS | Status: AC
  Administered 2015-10-23: 1000 mL via INTRAVENOUS

## 2015-10-23 MED ORDER — ENOXAPARIN SODIUM 30 MG/0.3ML ~~LOC~~ SOLN
30.0000 mg | SUBCUTANEOUS | Status: DC
  Administered 2015-10-23: 30 mg via SUBCUTANEOUS
  Filled 2015-10-23: qty 0.3

## 2015-10-23 MED ORDER — SODIUM CHLORIDE 0.9 % IV SOLN
INTRAVENOUS | Status: AC
  Administered 2015-10-23: 18:00:00 via INTRAVENOUS

## 2015-10-23 NOTE — Progress Notes (Signed)
CRITICAL VALUE ALERT  Critical value received:  Lactic Acid 2.9  Date of notification:  10/23/15  Time of notification:  6060  Critical value read back:Yes.    Nurse who received alert:  Precious Bard   MD notified (1st page):  Rivet  Time of first page:  1830  MD notified (2nd page):  Time of second page:  Responding MD:    Time MD responded:  Rivet

## 2015-10-23 NOTE — Assessment & Plan Note (Signed)
A/P: Malodorous wound without signs of surrounding cellulitis, but patient is tachycardic with N/V c/f systemic infection.  Patient did not complete the appropriate steroid taper and she may or may not have completed the appropriate antibiotic regimen.  In addition, patient's pain is currently uncontrolled.  Inpatient admission warranted for wound care, pain control, and IV antibiotics. - Admit patient to floor - Recommend blood cultures and Vanc IV - Recommend wound care consult

## 2015-10-23 NOTE — Consult Note (Signed)
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 Acetaminophen Swelling    REACTION: eyelid swelling   Patient Measurements: Weight: 92 lb 9.5 oz (42 kg)  Vancomycin Dosing Weight:  43 kg    Vital Signs: Temp: 98.1 F (36.7 C) (01/18 1640) Temp Source: Oral (01/18 1640) BP: 131/71 mmHg (01/18 1640) Pulse Rate: 94 (01/18 1640)  Labs:  Recent Labs  10/23/15 1420  WBC 14.9*  HGB 13.2  PLT 370  CREATININE 1.80*   Estimated Creatinine Clearance: 24.2 mL/min (by C-G formula based on Cr of 1.8).  Microbiology: Recent Results (from the past 720 hour(s))  Blood culture (routine x 2)     Status: None   Collection Time: 09/25/15  3:35 AM  Result Value Ref Range Status   Specimen Description BLOOD RIGHT ARM  Final   Special Requests AEROBIC BOTTLE ONLY 10ML  Final   Culture NO GROWTH 5 DAYS  Final   Report Status 09/30/2015 FINAL  Final  Blood culture (routine x 2)     Status: None   Collection Time: 09/25/15  3:40 AM  Result Value Ref Range Status   Specimen Description BLOOD LEFT ARM  Final   Special Requests BOTTLES DRAWN AEROBIC AND ANAEROBIC 5ML  Final   Culture NO GROWTH 5 DAYS  Final   Report Status 09/30/2015 FINAL  Final  Wound culture     Status: None   Collection Time: 09/25/15  5:03 AM  Result Value Ref Range Status   Specimen Description WOUND RIGHT LEG  Final   Special Requests NONE  Final   Gram Stain   Final    MODERATE WBC PRESENT,BOTH PMN AND MONONUCLEAR NO SQUAMOUS EPITHELIAL CELLS SEEN MODERATE GRAM POSITIVE COCCI IN PAIRS Performed at [facility]    Culture   Final    ABUNDANT  STAPHYLOCOCCUS AUREUS Note: RIFAMPIN AND GENTAMICIN SHOULD NOT BE USED AS SINGLE DRUGS FOR TREATMENT OF STAPH INFECTIONS. This organism is presumed to be Clindamycin resistant based on detection of inducible Clindamycin resistance. Performed at [facility]    Report Status 09/28/2015 FINAL  Final   Organism ID, Bacteria STAPHYLOCOCCUS AUREUS  Final      Susceptibility   Staphylococcus aureus - MIC*    CLINDAMYCIN RESISTANT      ERYTHROMYCIN RESISTANT      GENTAMICIN <=0.5 SENSITIVE Sensitive     LEVOFLOXACIN 0.25 SENSITIVE Sensitive     OXACILLIN 2 SENSITIVE Sensitive     RIFAMPIN <=0.5 SENSITIVE Sensitive     TRIMETH/SULFA 160 RESISTANT Resistant     VANCOMYCIN <=0.5 SENSITIVE Sensitive     TETRACYCLINE <=1 SENSITIVE Sensitive     MOXIFLOXACIN <=0.25 SENSITIVE Sensitive     * ABUNDANT STAPHYLOCOCCUS AUREUS  Surgical pcr screen     Status: Abnormal   Collection Time: 09/26/15 11:49 AM  Result Value Ref Range Status   MRSA, PCR POSITIVE (A) NEGATIVE Final    Comment: RESULT CALLED TO, READ BACK BY AND VERIFIED WITH: Fausto Skillern RN 14:20 09/26/15 ([email or web address])    Staphylococcus aureus POSITIVE (A) NEGATIVE Final    Comment:        The Xpert SA Assay (FDA  approved for NASAL specimens in patients over 36 years of age), is one component of a comprehensive surveillance program.  Test performance has been validated by [facility] for patients greater than or equal to 54 year old. It is not intended to diagnose infection nor to guide or monitor treatment.     Medical/Surgical History: Past Medical History  Diagnosis Date  . 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 Followed by Dr. Louanne Skye  . Hypertension   . Cocaine abuse     ongoing with resultant vaculitis.  . Depression   . Normocytic anemia     BL Hgb 9.8-12. Last anemia panel 04/2010 - showing Fe 19, ferritin 101.  Pt on monthly B12 injections  . VASCULITIS 04/17/2010    2/2 levimasole toxicity vs autoimmune d/o      . CAP (community acquired pneumonia) 03/2014 X 2  . Daily headache   . Migraines     "probably 5-6/yr" (04/21/2014)  . Rheumatoid arthritis(714.0)     patient reported  . Inflammatory arthritis [facility])    Past Surgical History  Procedure Laterality Date  . Skin biopsy Bilateral 04/2010    shin nodules  . Irrigation and debridement abscess Bilateral 09/26/2013    Procedure: DEBRIDEMENT ULCERS BILATERAL THIGHS;  Surgeon: Gwenyth Ober, MD;  Location: [facility];  Service: General;  Laterality: Bilateral;  . Hernia repair      "stomach"  . I&d extremity Right 09/26/2015    Procedure: IRRIGATION AND DEBRIDEMENT LEG WOUND  VAC PLACEMENT.;  Surgeon: Loel Lofty Dillingham, DO;  Location: [facility];  Service: Plastics;  Laterality: Right;    Current Medication[s] Include: Prior to Admission: Prescriptions prior to admission  Medication Sig Dispense Refill Last Dose  . predniSONE (DELTASONE) 10 MG tablet 5 tabs daily until 1/5, then 4 tabs daily from 1/6-1/12, then 3 tabs daily 1/13-1/19, then 2 tabs daily 1/20-1/27, then 1 tab 1/28 to 2/2 120 tablet 0 10/12/2015 at Unknown time   Scheduled:  Scheduled:  . enoxaparin (LOVENOX) injection  30 mg Subcutaneous Q24H  . [START ON 10/24/2015] predniSONE  40 mg Oral Q breakfast  . sodium chloride  1,000 mL Intravenous Once   Infusion[s]: Infusions:  . 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  She is admitted for treatment of a non-healing leg wound.  Ceftriaxone and Vancomycin to be started per Pharmacy consult.  Patient admitted with an acute kidney injury with her Scr more than doubling from her baseline Scr of ~ 0.75.  Her Scr today 1.8 with estimated CrCl of 25 ml/min.  Goal of Therapy:  Ceftriaxone dosed for clinical indication and adjusted for renal function as required.  Vancomycin trough ~ 15 mcg/ml given non-healing  cellulitis.   Plan:  1. Ceftriaxone 1 gm IV q 24 hours. 2. Vancomycin 1 gm IV load x 1. 3. Follow-up BMP in AM to determine renal function and re-dose Vancomycin as indicated. 4. Will monitor renal function, WBC, fever curve, any current cultures/sensitivities, Vancomycin levels as clinically indicated, length of therapy, and will follow clinical progression.  Jaliya Siegmann, Zettie Pho,  Pharm.D,    10/23/2015  4:51 PM

## 2015-10-23 NOTE — Addendum Note (Signed)
Addended by: Viviano Simas A on: 10/23/2015 02:08 PM   Modules accepted: Orders

## 2015-10-23 NOTE — Progress Notes (Signed)
[name], female   DOB: 12-14-1960, 55 y.o.   MRN: [id number]    Subjective:   [name]   DOB: 23-Mar-1961 55 y.o.   MRN: [id number]  HPI: [name] is a 55 y.o. female with PMH as below, here for hospital and ED followup.  Please see Problem-Based charting for the status of the patient's chronic medical issues.  Patient was admitted in December with Levamisole vasculitis and necrotic wounds on her legs.  Attempt was made to discharge to SNF, but patient then refused to go.  She was discharged on Bactrim for Staph infection. Sensitivities later returned with resistance to Bactrim and patient was switched to Doxycycline.  Patient reports completing the antibiotic course, but she does not know whether she was taking Bactrim or Doxy.  She was also discharged on prolonged Prednisone taper, but patient states she is finished with it.   Since discharge, patient has not been seen by Wound Care. She has been performing her own wet/dry dressing changes BID.  She states there has been no improvement, and it is worsening, but she cannot say how it has worsened.   She had nausea and vomiting this morning.  She denies fever, chills, CP, or SOB.  Her last cocaine use was 3 days ago.   Past Medical History  Diagnosis Date  . 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 Followed by Dr. Louanne Skye  . Hypertension   . Cocaine abuse     ongoing with resultant vaculitis.  . Depression   . Normocytic anemia     BL Hgb 9.8-12. Last anemia panel 04/2010 - showing Fe 19, ferritin 101.  Pt on monthly B12 injections  . 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 CAP (community acquired pneumonia) 03/2014 X 2  . Daily headache   . Migraines     "probably 5-6/yr" (04/21/2014)  . Rheumatoid arthritis(714.0)     patient reported  . Inflammatory arthritis [facility])    Current Outpatient Prescriptions  Medication Sig Dispense Refill  . predniSONE  (DELTASONE) 10 MG tablet 5 tabs daily until 1/5, then 4 tabs daily from 1/6-1/12, then 3 tabs daily 1/13-1/19, then 2 tabs daily 1/20-1/27, then 1 tab 1/28 to 2/2 120 tablet 0   No current facility-administered medications for this visit.   Family History  Problem Relation Age of Onset  . Breast cancer Mother     Breast cancer  . Alcohol abuse Mother   . Colon cancer Maternal Aunt 78  . Alcohol abuse Father    Social History   Social History  . Marital Status: Single    Spouse Name: N/A  . Number of Children: 0  . Years of Education: 11th grade   Occupational History  . Disability     since 2011, due to her rheumatoid arthritis   Social History Main Topics  . Smoking status: Current Every Day Smoker -- 0.30 packs/day for 34 years    Types: Cigarettes  . Smokeless tobacco: Never Used     Comment: "Trying very hard". / 4-5 CIGARETTES A DAY  . Alcohol Use: No  . Drug Use: Yes    Special: "Crack" cocaine, Cocaine     Comment: States she still uses crack "a little bit"., last used 09/23/15  . Sexual Activity: No   Other Topics Concern  . Not on file   Social History Narrative   Unemployed:  cleaning in past   Living at [place]; has Medicaid.   crack/cocaine use; pt denies IVDU   tobacco:  1/2 ppd, trying to quit   alcohol:  none       Review of Systems: Pertinent items are noted in HPI. Objective:  Physical Exam: Filed Vitals:   10/23/15 1014  Weight: 95 lb 8 oz (43.319 kg)   Physical Exam  Constitutional: She is oriented to person, place, and time.  Frail, chronically ill appearing female, sitting in chair, crying, in moderate distress.  HENT:  Normocephalic. Evidence of previous vasculitis with tissue destruction.  Eyes: EOM are normal.  Cardiovascular: Normal heart sounds and intact distal pulses.   Tachycardic. Regular rhythm.  Pulmonary/Chest: Effort normal and breath sounds normal. No stridor. No respiratory distress. She has no wheezes.    Musculoskeletal: She exhibits no edema.  Neurological: She is alert and oriented to person, place, and time.  Skin:  Extremely malodorous wound to posterior right leg with large ulcer with both granulation and fibrinous exudate.  No pus drainage appreciated, but bandages with yellow/sanguinous drainage.  No surrounding erythema.  Surrounding skin extremely sensitive to light touch.        Assessment & Plan:   Patient and case were discussed with Dr. Evette Doffing.  Please refer to Problem Based charting for further documentation.

## 2015-10-23 NOTE — Addendum Note (Signed)
Addended by: Marcelino Duster on: 10/23/2015 02:22 PM   Modules accepted: Orders

## 2015-10-23 NOTE — H&P (Signed)
Date: 10/23/2015               Patient Name:  [name] MRN: [id number]  DOB: 1961/03/07 Age / Sex: 55 y.o., female   PCP: Zada Finders, MD         Medical Service: Internal Medicine Teaching Service         Attending Physician: Dr. Annia Belt, MD    First Contact: Dr. Jule Ser Pager: [phone]  Second Contact: Dr. Albin Felling Pager: [phone]       After Hours (After 5p/  First Contact Pager: [phone]  weekends / holidays): Second Contact Pager: [phone]   Chief Complaint: non-healing wound on distal, posterior right leg  History of Present Illness: [name] is 55 y.o. woman well known to the Internal Medicine Teaching Service.  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  She was admitted directly from the Internal Medicine Clinic due to a non-healing, presumably infected wound.  Patient was recently hospitalized from Dec 21-28 where she had extensive debridement of this wound by Plastic Surgery and wound vac placement.  She was initially started on TMP-SMX but wound culture was resistant to TMP-SMX and she was converted to Doxycycline to complete 7-day course of antibiotics.  It was recommended that she be discharged with wound vac, however, this was deemed to be cost prohibitive and, thus, recommendations were for daily wet-to-dry dressings at a skilled nursing facility.   Patient ultimately refused SNF placement and was discharged to home to provide her own dressing changes and follow up with the [facility] [facility].  She unfortunately did not follow up and states there has been no improvement.  She presented to the ED on January 8 due to this same wound where she had it mildly debrided, irrigated, dressed, and was provided with dressing material.  Was discharged from the ED with wound care follow-up.  She presented to clinic this morning with 3-4 days of worsening  pain, purulent, foul smelling drainage.  She reports completing course of antibiotics but is not currently on prednisone as prescribed.  She reports nausea and vomiting this morning.  She denies fever, chills, chest pain, shortness of breath.  She reports continued cocaine abuse with last time using reported as 3 days ago.  Meds: No current facility-administered medications for this encounter.   Current Outpatient Prescriptions  Medication Sig Dispense Refill  . predniSONE (DELTASONE) 10 MG tablet 5 tabs daily until 1/5, then 4 tabs daily from 1/6-1/12, then 3 tabs daily 1/13-1/19, then 2 tabs daily 1/20-1/27, then 1 tab 1/28 to 2/2 120 tablet 0    Allergies: Allergies as of 10/23/2015 - Review Complete 10/23/2015  Allergen Reaction Noted  . Acetaminophen Swelling    Past Medical History  Diagnosis Date  . Vasculitis ([place])     2/2 Levimasole toxicity. Followed by Dr. Louanne Skye  . Hypertension   . Cocaine abuse     ongoing with resultant vaculitis.  . Depression   . Normocytic anemia     BL Hgb 9.8-12. Last anemia panel 04/2010 - showing Fe 19, ferritin 101.  Pt on monthly B12 injections  . 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 CAP (community acquired pneumonia) 03/2014 X 2  . Daily headache   . Migraines     "probably 5-6/yr" (04/21/2014)  . Rheumatoid arthritis(714.0)     patient reported  .  Inflammatory arthritis [facility])    Past Surgical History  Procedure Laterality Date  . Skin biopsy Bilateral 04/2010    shin nodules  . Irrigation and debridement abscess Bilateral 09/26/2013    Procedure: DEBRIDEMENT ULCERS BILATERAL THIGHS;  Surgeon: Gwenyth Ober, MD;  Location: [place];  Service: General;  Laterality: Bilateral;  . Hernia repair      "stomach"  . I&d extremity Right 09/26/2015    Procedure: IRRIGATION AND DEBRIDEMENT LEG WOUND  VAC PLACEMENT.;  Surgeon: Loel Lofty Dillingham, DO;  Location: [place];  Service: Plastics;  Laterality: Right;    Family History  Problem Relation Age of Onset  . Breast cancer Mother     Breast cancer  . Alcohol abuse Mother   . Colon cancer Maternal Aunt 19  . Alcohol abuse Father    Social History   Social History  . Marital Status: Single    Spouse Name: N/A  . Number of Children: 0  . Years of Education: 11th grade   Occupational History  . Disability     since 2011, due to her rheumatoid arthritis   Social History Main Topics  . Smoking status: Current Every Day Smoker -- 0.30 packs/day for 34 years    Types: Cigarettes  . Smokeless tobacco: Never Used     Comment: "Trying very hard". / 4-5 CIGARETTES A DAY  . Alcohol Use: No  . Drug Use: Yes    Special: "Crack" cocaine, Cocaine     Comment: States she still uses crack "a little bit"., last used 09/23/15  . Sexual Activity: No   Other Topics Concern  . Not on file   Social History Narrative   Unemployed:  cleaning in past   Living at [place]; has Medicaid.   crack/cocaine use; pt denies IVDU   tobacco:  1/2 ppd, trying to quit   alcohol:  none        Review of Systems: Pertinent items are noted in HPI.  Physical Exam: There were no vitals taken for this visit. Physical Exam  Constitutional: She is oriented to person, place, and time.  Frail appearing female, lying in bed with sheets over her face, crying, moderate distress  HENT:  Head: Normocephalic.  Evidence of vasculitic changes of ears and nose  Eyes: EOM are normal.  Neck: Normal range of motion.  Cardiovascular: Regular rhythm, normal heart sounds and intact distal pulses.   Rate is tachycardic  Pulmonary/Chest: Effort normal and breath sounds normal.  Abdominal: Soft. Bowel sounds are normal. There is no tenderness.  Neurological: She is alert and oriented to person, place, and time.  Skin:  Malodorous wound to posterior right leg with large ulcer with both granulation and fibrinous exudate. No pus drainage appreciated. No surrounding  erythema. Surrounding skin extremely sensitive to light touch.\ Other chronic wounds in various stages of healing over much of her extremities.       Lab results: Basic Metabolic Panel: No results for input(s): NA, K, CL, CO2, GLUCOSE, BUN, CREATININE, CALCIUM, MG, PHOS in the last 72 hours. Liver Function Tests: No results for input(s): AST, ALT, ALKPHOS, BILITOT, PROT, ALBUMIN in the last 72 hours. No results for input(s): LIPASE, AMYLASE in the last 72 hours. No results for input(s): AMMONIA in the last 72 hours. CBC:  Recent Labs  10/23/15 1420  WBC 14.9*  NEUTROABS 11.2*  HGB 13.2  HCT 39.8  MCV 86.1  PLT 370   Cardiac Enzymes: No results for input(s): CKTOTAL,  CKMB, CKMBINDEX, TROPONINI in the last 72 hours. BNP: No results for input(s): PROBNP in the last 72 hours. D-Dimer: No results for input(s): DDIMER in the last 72 hours. CBG: No results for input(s): GLUCAP in the last 72 hours. Hemoglobin A1C: No results for input(s): HGBA1C in the last 72 hours. Fasting Lipid Panel: No results for input(s): CHOL, HDL, LDLCALC, TRIG, CHOLHDL, LDLDIRECT in the last 72 hours. Thyroid Function Tests: No results for input(s): TSH, T4TOTAL, FREET4, T3FREE, THYROIDAB in the last 72 hours. Anemia Panel: No results for input(s): VITAMINB12, FOLATE, FERRITIN, TIBC, IRON, RETICCTPCT in the last 72 hours. Coagulation: No results for input(s): LABPROT, INR in the last 72 hours. Urine Drug Screen: Drugs of Abuse     Component Value Date/Time   LABOPIA NONE DETECTED 09/25/2015 0428   LABOPIA PPS 08/31/2012 1045   COCAINSCRNUR POSITIVE* 09/25/2015 0428   COCAINSCRNUR NEG 08/31/2012 1045   LABBENZ NONE DETECTED 09/25/2015 0428   LABBENZ NEG 08/31/2012 1045   LABBENZ NEGATIVE 06/12/2011 0221   AMPHETMU NONE DETECTED 09/25/2015 0428   AMPHETMU NEG 08/31/2012 1045   AMPHETMU NEGATIVE 06/12/2011 0221   THCU NONE DETECTED 09/25/2015 0428   THCU NEG 08/31/2012 1045   THCU 63.4*  05/20/2012 1405   LABBARB NONE DETECTED 09/25/2015 0428   LABBARB NEG 08/31/2012 1045    Alcohol Level: No results for input(s): ETH in the last 72 hours. Urinalysis: No results for input(s): COLORURINE, LABSPEC, PHURINE, GLUCOSEU, HGBUR, BILIRUBINUR, KETONESUR, PROTEINUR, UROBILINOGEN, NITRITE, LEUKOCYTESUR in the last 72 hours.  Invalid input(s): APPERANCEUR   Imaging results:  No results found.  Other results: EKG: none  Assessment & Plan by Problem: Active Problems:   * No active hospital problems. *  Non-Healing Wound of Right Leg:  Secondary to Levimasole induced vasculitis from ongoing cocaine abuse.  Patient reports finishing course of antibiotics and providing her own wet-to-dry dressing changes since hospital discharge on Dec 28.  She has not followed up with the [facility].  Labs obtained in clinic included: lactic acid of 2.0, CBC with leukocytosis of 14.9 (ANC 11.2), CMP with normal sodium and potassium, creatine of 1.8 (baseline ~0.75).  Likely has a bacterial superinfection given the purulence and odor so will cover for Staph, Strep, GNR's.  Afebrile on admission   - Oxycodone 103m q6h as needed for pain - Prednisone 416mdaily -->  She will need to be on prolonged taper as previously discussed during last hospitalization - Consult WOC, appreciate their assistance - Blood cultures x 2 - Wound culture - Trend lactic acid, WBC, fevers - Vancomycin and Ceftriaxone per pharmacy - PT evaluation  Acute Kidney Injury: creatine elevated to 1.8 on admission with baseline approximately 0.75 - NS IV fluids 1 liter bolus then NS infusion at 10066mr - Trend BMP  Cocaine Abuse - Social work consult  FEN Fluids: NS at 100m94m Electrolytes: replace as needed Nutrition: Regular diet  DVT  PPx: Lovenox  CODE: Full  Dispo: Disposition is deferred at this time, awaiting improvement of current medical problems.   The patient does have a current PCP (VishZada Finders)  and [facility].  The patient does have transportation limitations that hinder transportation to clinic appointments.  Signed: AndrJule Ser 10/23/2015, 3:06 PM

## 2015-10-24 ENCOUNTER — Encounter (HOSPITAL_COMMUNITY): Payer: Self-pay | Admitting: General Practice

## 2015-10-24 LAB — BASIC METABOLIC PANEL
ANION GAP: 7 (ref 5–15)
BUN: 20 mg/dL (ref 6–20)
CALCIUM: 8.1 mg/dL — AB (ref 8.9–10.3)
CO2: 25 mmol/L (ref 22–32)
CREATININE: 1.21 mg/dL — AB (ref 0.44–1.00)
Chloride: 109 mmol/L (ref 101–111)
GFR, EST AFRICAN AMERICAN: 59 mL/min — AB (ref >60)
GFR, EST NON AFRICAN AMERICAN: 51 mL/min — AB (ref >60)
GLUCOSE: 109 mg/dL — AB (ref 65–99)
Potassium: 3.4 mmol/L — ABNORMAL LOW (ref 3.5–5.1)
Sodium: 141 mmol/L (ref 135–145)

## 2015-10-24 LAB — CBC WITH DIFFERENTIAL/PLATELET
BASOS ABS: 0 10*3/uL (ref 0.0–0.1)
BASOS PCT: 0 %
EOS PCT: 1 %
Eosinophils Absolute: 0.1 10*3/uL (ref 0.0–0.7)
HCT: 33.6 % — ABNORMAL LOW (ref 36.0–46.0)
Hemoglobin: 10.8 g/dL — ABNORMAL LOW (ref 12.0–15.0)
Lymphocytes Relative: 29 %
Lymphs Abs: 2.1 10*3/uL (ref 0.7–4.0)
MCH: 27.6 pg (ref 26.0–34.0)
MCHC: 32.1 g/dL (ref 30.0–36.0)
MCV: 85.9 fL (ref 78.0–100.0)
MONO ABS: 0.4 10*3/uL (ref 0.1–1.0)
Monocytes Relative: 5 %
Neutro Abs: 4.7 10*3/uL (ref 1.7–7.7)
Neutrophils Relative %: 65 %
PLATELETS: 257 10*3/uL (ref 150–400)
RBC: 3.91 MIL/uL (ref 3.87–5.11)
RDW: 14.5 % (ref 11.5–15.5)
WBC: 7.3 10*3/uL (ref 4.0–10.5)

## 2015-10-24 MED ORDER — VANCOMYCIN HCL IN DEXTROSE 750-5 MG/150ML-% IV SOLN
750.0000 mg | INTRAVENOUS | Status: DC
  Administered 2015-10-24 – 2015-10-26 (×3): 750 mg via INTRAVENOUS
  Filled 2015-10-24 (×5): qty 150

## 2015-10-24 MED ORDER — ADULT MULTIVITAMIN W/MINERALS CH
1.0000 | ORAL_TABLET | Freq: Every day | ORAL | Status: DC
  Administered 2015-10-24 – 2015-10-29 (×6): 1 via ORAL
  Filled 2015-10-24 (×6): qty 1

## 2015-10-24 MED ORDER — POTASSIUM CHLORIDE CRYS ER 20 MEQ PO TBCR
40.0000 meq | EXTENDED_RELEASE_TABLET | Freq: Two times a day (BID) | ORAL | Status: DC
  Administered 2015-10-24 – 2015-10-29 (×11): 40 meq via ORAL
  Filled 2015-10-24 (×11): qty 2

## 2015-10-24 MED ORDER — BOOST / RESOURCE BREEZE PO LIQD
1.0000 | Freq: Three times a day (TID) | ORAL | Status: DC
Start: 2015-10-24 — End: 2015-10-29
  Administered 2015-10-24: 1 via ORAL
  Administered 2015-10-25: 0.9875 via ORAL
  Administered 2015-10-26 – 2015-10-29 (×7): 1 via ORAL

## 2015-10-24 MED ORDER — SODIUM CHLORIDE 0.9 % IV SOLN
INTRAVENOUS | Status: AC
  Administered 2015-10-24: 12:00:00 via INTRAVENOUS

## 2015-10-24 MED ORDER — PRO-STAT SUGAR FREE PO LIQD
30.0000 mL | Freq: Every day | ORAL | Status: DC
  Administered 2015-10-25: 30 mL via ORAL
  Filled 2015-10-24 (×3): qty 30

## 2015-10-24 MED ORDER — ENOXAPARIN SODIUM 30 MG/0.3ML ~~LOC~~ SOLN
20.0000 mg | SUBCUTANEOUS | Status: DC
  Administered 2015-10-26 – 2015-10-28 (×3): 20 mg via SUBCUTANEOUS
  Filled 2015-10-24 (×3): qty 0.3

## 2015-10-24 MED ORDER — ENSURE ENLIVE PO LIQD
237.0000 mL | Freq: Two times a day (BID) | ORAL | Status: DC
  Administered 2015-10-24: 237 mL via ORAL

## 2015-10-24 NOTE — Care Management Note (Signed)
Case Management Note  Patient Details  Name: KELTY SZAFRAN MRN: [id number] Date of Birth: [birth year]  Subjective/Objective:                    Action/Plan:  Initial UR completed  Expected Discharge Date:                  Expected Discharge Plan:  [place]  In-House Referral:     Discharge planning Services     Post Acute Care Choice:    Choice offered to:     DME Arranged:    DME Agency:     HH Arranged:    [place] Agency:     Status of Service:  In process, will continue to follow  Medicare Important Message Given:    Date Medicare IM Given:    Medicare IM give by:    Date Additional Medicare IM Given:    Additional Medicare Important Message give by:     If discussed at [place] of Stay Meetings, dates discussed:    Additional Comments:  Marilu Favre, RN 10/24/2015, 7:59 AM

## 2015-10-24 NOTE — Progress Notes (Signed)
PT Cancellation Note  Patient Details Name: [name] MRN: [id number] DOB: 25-Aug-1961   Cancelled Treatment:    Reason Eval/Treat Not Completed: Pain limiting ability to participate;Fatigue/lethargy limiting ability to participate.  Patient declined PT today.  Will return tomorrow.   Despina Pole 10/24/2015, 5:13 PM Carita Pian. Sanjuana Kava, [place] Pager [phone]

## 2015-10-24 NOTE — Progress Notes (Signed)
Subjective: Patient seen and examined this morning on rounds.  Patient doing much better this morning. Her pain is being controlled on oral pain medicine as needed.  She reports some drainage from her right calf wound today.  No other complaints.  Objective: Vital signs in last 24 hours: Filed Vitals:   10/23/15 1640 10/23/15 2105 10/24/15 0526  BP: 131/71 126/71 119/75  Pulse: 94 103 85  Temp: 98.1 F (36.7 C) 98.2 F (36.8 C) 99.1 F (37.3 C)  TempSrc: Oral Oral Oral  Resp: 16 17 18   Weight: 92 lb 9.5 oz (42 kg)    SpO2: 100% 100% 100%   Weight change:   Intake/Output Summary (Last 24 hours) at 10/24/15 1106 Last data filed at 10/24/15 1003  Gross per 24 hour  Intake    480 ml  Output   1100 ml  Net   -620 ml   General: frail appearing, pleasant, resting in bed, not in distress HEENT: EOMI, no scleral icterus Cardiac: RRR, no rubs, murmurs or gallops Pulm: clear to auscultation bilaterally, moving normal volumes of air Abd: soft, nontender, nondistended, BS present Ext: warm and well perfused, wound to right posterior right leg with non-adherent bandage covering with some sero-sanguinous drainage.  Other chronic wounds present over much of her extremities. Neuro: alert and oriented X3, no focal deficits  Lab Results: Basic Metabolic Panel:  Recent Labs Lab 10/23/15 1420 10/24/15 0453  NA 139 141  K 3.5 3.4*  CL 99* 109  CO2 29 25  GLUCOSE 149* 109*  BUN 26* 20  CREATININE 1.80* 1.21*  CALCIUM 9.7 8.1*   Liver Function Tests:  Recent Labs Lab 10/23/15 1420  AST 16  ALT 11*  ALKPHOS 58  BILITOT 0.6  PROT 6.3*  ALBUMIN 3.1*   No results for input(s): LIPASE, AMYLASE in the last 168 hours. No results for input(s): AMMONIA in the last 168 hours. CBC:  Recent Labs Lab 10/23/15 1420 10/24/15 0453  WBC 14.9* 7.3  NEUTROABS 11.2* 4.7  HGB 13.2 10.8*  HCT 39.8 33.6*  MCV 86.1 85.9  PLT 370 257   Cardiac Enzymes: No results for input(s):  CKTOTAL, CKMB, CKMBINDEX, TROPONINI in the last 168 hours. BNP: No results for input(s): PROBNP in the last 168 hours. D-Dimer: No results for input(s): DDIMER in the last 168 hours. CBG: No results for input(s): GLUCAP in the last 168 hours. Hemoglobin A1C: No results for input(s): HGBA1C in the last 168 hours. Fasting Lipid Panel: No results for input(s): CHOL, HDL, LDLCALC, TRIG, CHOLHDL, LDLDIRECT in the last 168 hours. Thyroid Function Tests: No results for input(s): TSH, T4TOTAL, FREET4, T3FREE, THYROIDAB in the last 168 hours. Coagulation: No results for input(s): LABPROT, INR in the last 168 hours. Anemia Panel: No results for input(s): VITAMINB12, FOLATE, FERRITIN, TIBC, IRON, RETICCTPCT in the last 168 hours. Urine Drug Screen: Drugs of Abuse     Component Value Date/Time   LABOPIA NONE DETECTED 09/25/2015 0428   LABOPIA PPS 08/31/2012 1045   COCAINSCRNUR POSITIVE* 09/25/2015 0428   COCAINSCRNUR NEG 08/31/2012 1045   LABBENZ NONE DETECTED 09/25/2015 0428   LABBENZ NEG 08/31/2012 1045   LABBENZ NEGATIVE 06/12/2011 0221   AMPHETMU NONE DETECTED 09/25/2015 0428   AMPHETMU NEG 08/31/2012 1045   AMPHETMU NEGATIVE 06/12/2011 0221   THCU NONE DETECTED 09/25/2015 0428   THCU NEG 08/31/2012 1045   THCU 63.4* 05/20/2012 1405   LABBARB NONE DETECTED 09/25/2015 0428   LABBARB NEG 08/31/2012 1045  Alcohol Level: No results for input(s): ETH in the last 168 hours. Urinalysis: No results for input(s): COLORURINE, LABSPEC, PHURINE, GLUCOSEU, HGBUR, BILIRUBINUR, KETONESUR, PROTEINUR, UROBILINOGEN, NITRITE, LEUKOCYTESUR in the last 168 hours.  Invalid input(s): APPERANCEUR  Micro Results: Recent Results (from the past 240 hour(s))  Culture, blood (Routine X 2) w Reflex to ID Panel     Status: None (Preliminary result)   Collection Time: 10/23/15  4:50 PM  Result Value Ref Range Status   Specimen Description BLOOD RIGHT ANTECUBITAL  Final   Special Requests BOTTLES DRAWN  AEROBIC AND ANAEROBIC 5CC  Final   Culture PENDING  Incomplete   Report Status PENDING  Incomplete  Culture, blood (Routine X 2) w Reflex to ID Panel     Status: None (Preliminary result)   Collection Time: 10/23/15  5:00 PM  Result Value Ref Range Status   Specimen Description BLOOD LEFT HAND  Final   Special Requests BOTTLES DRAWN AEROBIC AND ANAEROBIC 10CC  Final   Culture PENDING  Incomplete   Report Status PENDING  Incomplete   Studies/Results: No results found. Medications:  Scheduled Meds: . cefTRIAXone (ROCEPHIN) 1 g IVPB  1 g Intravenous Q24H  . enoxaparin (LOVENOX) injection  20 mg Subcutaneous Q24H  . potassium chloride  40 mEq Oral BID  . predniSONE  40 mg Oral Q breakfast  . vancomycin  750 mg Intravenous Q24H   Continuous Infusions: . sodium chloride 100 mL/hr at 10/24/15 0915   PRN Meds:.oxyCODONE Assessment/Plan: Principal Problem:   Cocaine-induced vascular disorder (HCC) Active Problems:   Cocaine dependence (HCC)   Major depression, recurrent (HCC)   Protein-calorie malnutrition, severe (HCC)   Leg wound, left   Infected wound ([facility])   Lactic acidosis  Non-Healing Wound of Right Leg: Secondary to Levimasole induced vasculitis from ongoing cocaine abuse. Leukocytosis this morning resolved, Tmax last 24 hours is 99.1, her lactic acidosis trended from 2.0 >> 2.9 >> 1.3 with IV fluids. - continue trending CBC and fever curve - continue vancomycin and ceftriaxone (start date 1/18)  - continue oxycodone 60m q6h as needed for pain - WOC consult appreciated - Wound cx pending - Blood cx pending - continue Prednisone 474mdaily, then begin to slowly taper - PT evaluation pending  Acute Kidney Injury: creatine elevated to 1.8 on admission with baseline approximately 0.75 - continue NS infusion at 10032mr - Trend BMP, creatine improved to 1.2 this morning  Cocaine Abuse - Social work consult  FEN Fluids: NS at 100m52m Electrolytes: potassium 3.4  today, given 40mE4mdur Nutrition: Regular diet  DVT PPx: Lovenox  CODE: Full  Dispo: Disposition is deferred at this time, awaiting improvement of current medical problems.    The patient does have a current PCP (VishaZada Finders and [facility].  The patient does have transportation limitations that hinder transportation to clinic appointments.  .Services Needed at time of discharge: Y = Yes, Blank = No PT:   OT:   RN:   Equipment:   Other:     LOS: 1 day   AndreJule Ser1/19/2017, 11:06 AM

## 2015-10-24 NOTE — Consult Note (Signed)
WOC wound consult note Patient well known to this [place] nurse from her previous admissions. She has levamisole vasculitis with history of multiple wounds over her face, arms, legs and buttocks. She continues to use cocaine.  She has limited resources, is homeless from time to time.  She actually only has two open wounds today at the time of my assessment which is the least I have ever seen open at one time.  Reason for Consult: LE ulcers Wound type: Levamisole vasculitis Pressure Ulcer POA: No Measurement: Right posterior calf: 10cm x 5.5cm x 0.2cm (this wound was recently debrided per plastics with [facility] and VAC, however all of this was disrupted and removed at the time of DC due to current living situation and resources). Right lateral leg: 0.5cm x 0.5cm x 0.2cm  Wound NHA:FBXU wounds are clean.  The right posterior has less than 10% slough Drainage (amount, consistency, odor) minimal, at the time of my assessment the drainage from the posterior leg wound is serosanguinous, with no odor. Periwound: intact, with scarring over the legs, face, arms buttocks from previous ulcerations Dressing procedure/placement/frequency: Continue nonadherent dressings for now, she may benefit from antimicrobial dressing, however with her history I do not think she can tolerate the dressing changes, the product must touch the wound bed to be effective and the wound is so painful for her I do not think she will do well with this.  The wound does not look overly infected, and is much improved from her last visit.  She reports she will agree to SNF placement for wound care this time.  Discussed POC with patient and bedside nurse.  Re consult if needed, will not follow at this time. Thanks  Adasha Boehme [place], [place] [phone])

## 2015-10-24 NOTE — Progress Notes (Signed)
Internal Medicine Clinic Attending  I saw and evaluated the patient.  I personally confirmed the key portions of the history and exam documented by Dr. Lovena Le and I reviewed pertinent patient test results.  The assessment, diagnosis, and plan were formulated together and I agree with the documentation in the resident's note.  Very difficult case of recurrent deep ulcerations that are due to probable levamisole-induced vasculitis. The ulcer is again clearly superinfected and has not responded well to wound care at home. The patient clearly has multiple poor social determinants of her heath that are prohibiting wound healing. I agree with the need for inpatient management of this infected wound.

## 2015-10-24 NOTE — Progress Notes (Signed)
Pharmacy Antibiotic Follow-up Note  [name] is a 55 y.o. year-old female admitted on 10/23/2015.  The patient is currently on day 1 of Vancomycin for non-healing leg wound.  Assessment/Plan: Pt presented with AKI, Cr now improved with hydration.  She was loaded with Vanc last PM, with future dosing based on AM Cr.  Estimated CrCl ~35-59m/min based on Cr 1.21 this AM, this may continue to improve.  Her Cr was < 1 earlier this month.  1-  Continue Vancomycin 7549mIV q24, next dose at 7PM 2-  Change Lovenox to 2028mQ q24, based on low weight of 42kg 3-  Check BMet in AM, readjust Vanc dosing if needed 4-  Trend clinical status, f/u cx, and check Vanc Trough when appropriate  Temp (24hrs), Avg:98.5 F (36.9 C), Min:98.1 F (36.7 C), Max:99.1 F (37.3 C)   Recent Labs Lab 10/23/15 1420 10/24/15 0453  WBC 14.9* 7.3    Recent Labs Lab 10/23/15 1420 10/24/15 0453  CREATININE 1.80* 1.21*   Estimated Creatinine Clearance: 36.1 mL/min (by C-G formula based on Cr of 1.21).    Allergies  Allergen Reactions  . Acetaminophen Swelling    REACTION: eyelid swelling    Antimicrobials this admission: Vanc 1/18 >>  Ceftriaxone 1/18 >>   Microbiology results: 1/18 BCx: ntd 1/18 Wound: ntd 1/18 MRSA PCR: positive  Thank you for allowing pharmacy to be a part of this patient's care.  HiaJaquita FoldsarmD 10/24/2015 8:32 AM

## 2015-10-24 NOTE — Progress Notes (Signed)
Initial Nutrition Assessment  DOCUMENTATION CODES:   Underweight, Severe malnutrition in context of social or environmental circumstances  INTERVENTION:   -Boost Breeze po TID, each supplement provides 250 kcal and 9 grams of protein -30 ml Prostat daily, each supplement provides 100 kcals and 15 grams protein -MVI daily  NUTRITION DIAGNOSIS:   Malnutrition related to social / environmental circumstances as evidenced by severe depletion of muscle mass, severe depletion of body fat.  GOAL:   Patient will meet greater than or equal to 90% of their needs  MONITOR:   PO intake, Supplement acceptance, Labs, Weight trends, Skin, I & O's  REASON FOR ASSESSMENT:   Malnutrition Screening Tool    ASSESSMENT:   [name] is 55 y.o. woman well known to the Internal Medicine Teaching Service. 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 She was admitted directly from the Internal Medicine Clinic due to a non-healing, presumably infected wound.  Pt admitted with non-healing wounds of distal and posterior rt leg.  Spoke with pt at bedside, who reports great appetite. She consumed 100% of both her breakfast and lunch meals per her report. PTA, she was consuming 1-2 meals per day due to food insecurity (pt shared that she is homeless). She consumed Ensure Enlive supplement earlier, but reports "it's too thick". She is agreeable to try other supplement options. This RD provided container of Boost Breeze, which she consumed all during visit. She is amenable to Boost Breeze supplements during hospitalization.   Pt endorses weight loss, but unable to quantify time frame or amount. Noted wt has been stable over the past 6 months.   Nutrition-Focused physical exam completed. Findings are moderate to severe fat depletion, moderate to severe muscle depletion, and no edema.   Discussed  importance of good meal intake to promote healing.   Reviewed COWRN note from 10/24/15; pt with rt posterior and distal leg wounds (levamisole vasculitis).   CSW consulted for cocaine abuse. Pt has been resistant to SNF placement for wound care in the past, however, is agreeable to go per chart review.   Labs reviewed: K: 3.4 (on oral supplementation).  Diet Order:  Diet regular Room service appropriate?: Yes; Fluid consistency:: Thin  Skin:  Wound (see comment) (levasimole vasculitis on rt posterior and rt lower leg)  Last BM:  10/22/15  Height:   Ht Readings from Last 1 Encounters:  10/13/15 5' 1"  (1.549 m)    Weight:   Wt Readings from Last 1 Encounters:  10/23/15 92 lb 9.5 oz (42 kg)    Ideal Body Weight:  47.7 kg  BMI:  Body mass index is 17.5 kg/(m^2).  Estimated Nutritional Needs:   Kcal:  1300-1500  Protein:  55-70 grams  Fluid:  1.3-1.5 L  EDUCATION NEEDS:   Education needs addressed  Cecil Bixby A. Jimmye Norman, RD, LDN, CDE Pager: [phone] After hours Pager: [phone]

## 2015-10-25 LAB — CBC
HEMATOCRIT: 33.1 % — AB (ref 36.0–46.0)
HEMOGLOBIN: 10.4 g/dL — AB (ref 12.0–15.0)
MCH: 27.8 pg (ref 26.0–34.0)
MCHC: 31.4 g/dL (ref 30.0–36.0)
MCV: 88.5 fL (ref 78.0–100.0)
Platelets: 215 10*3/uL (ref 150–400)
RBC: 3.74 MIL/uL — ABNORMAL LOW (ref 3.87–5.11)
RDW: 14.2 % (ref 11.5–15.5)
WBC: 9.5 10*3/uL (ref 4.0–10.5)

## 2015-10-25 LAB — BASIC METABOLIC PANEL
ANION GAP: 5 (ref 5–15)
BUN: 18 mg/dL (ref 6–20)
CALCIUM: 8.5 mg/dL — AB (ref 8.9–10.3)
CO2: 25 mmol/L (ref 22–32)
Chloride: 110 mmol/L (ref 101–111)
Creatinine, Ser: 1.02 mg/dL — ABNORMAL HIGH (ref 0.44–1.00)
Glucose, Bld: 106 mg/dL — ABNORMAL HIGH (ref 65–99)
Potassium: 4.6 mmol/L (ref 3.5–5.1)
Sodium: 140 mmol/L (ref 135–145)

## 2015-10-25 NOTE — Evaluation (Signed)
Physical Therapy Evaluation Patient Details Name: [name] MRN: [id number] DOB: 05/14/1961 Today's Date: 10/25/2015   History of Present Illness  Pt is a 54 y.o. female admitted on 10/23/15 with recurrent levamisole-induced vasculitis of RLE. Pt had a recent admission for RLE I&D with wound vac placement on 09/26/15. Pt no longer has wound vac in place. Pt has other significant PMH of HTN, cocaine abuse, and RA.  Clinical Impression  Pt was very painful and reported she had not been out of bed for the day. Pt took two attempts to stand from sitting EOB due to pain. Pt was tearful in transfer, but reported she wanted to walk to the chair. Pt ambulated 5 feet to chair.  PT to follow acutely for deficits listed below.       Follow Up Recommendations SNF    Equipment Recommendations  Rolling walker with 5" wheels       Precautions / Restrictions Precautions Precautions: Fall Precaution Comments: Pt is extremely painful especially with movement; therefore, Pt is at risk for a fall due to pain. Restrictions Weight Bearing Restrictions: No      Mobility  Bed Mobility Overal bed mobility: Modified Independent             General bed mobility comments: Pt requires extra time to move RLE to EOB, and Pt uses bed rails to pull to edge.  Transfers Overall transfer level: Needs assistance Equipment used: Rolling walker (2 wheeled) Transfers: Sit to/from Stand Sit to Stand: Min guard Stand pivot transfers: Min guard       General transfer comment: Pt required verbal cues to back up to edge of recliner prior to sitting down. Pt is PWB due to pain on RLE. Due to pain, Pt took two attempts to stand from sitting EOB. Pt is very teary and painful.  Ambulation/Gait Ambulation/Gait assistance: Min guard Ambulation Distance (Feet): 5 Feet Assistive device: Rolling walker (2 wheeled) Gait Pattern/deviations: Step-to pattern;Antalgic;Decreased weight shift to right (gait pattern  affected by painful RLE) Gait velocity: decreased  Gait velocity interpretation: Below normal speed for age/gender General Gait Details: Pt is able to ambulate with +1 min guard and RW, but is only PWB on RLE due to pain.         Balance Overall balance assessment: Needs assistance Sitting-balance support: Feet supported;Single extremity supported Sitting balance-Leahy Scale: Fair Sitting balance - Comments: Pt is able to sit EOB with knees flexed, but due to pain in RLE, she tends to hold on to the edge of the bed with at least one UE at all times as she leans to the left to offset weight on the RLE on the floor.   Standing balance support: Bilateral upper extremity supported Standing balance-Leahy Scale: Fair Standing balance comment: In static standing, Pt essentially supports herself solely with LLE and bilateral UEs supported on RW. Pt only touches RLE to floor when she is ambulating.                             Pertinent Vitals/Pain Pain Assessment: 0-10 Pain Score: 8  Pain Location: right leg  Pain Descriptors / Indicators: Burning;Aching Pain Intervention(s): Limited activity within patient's tolerance;Monitored during session;Repositioned;Premedicated before session (Nurse reported giving pain medication 15 minutes prior to PT)    Home Living Family/patient expects to be discharged to:: Skilled nursing facility Living Arrangements: Non-relatives/Friends   Type of Home:  (not stated) Home Access: Stairs to enter Entrance  Stairs-Rails: Right Entrance Stairs-Number of Steps: 2   Home Equipment: Cane - single point;Shower seat Additional Comments: 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    Prior Function Level of Independence: Independent with assistive device(s) (uses single point cane on right side)         Comments: Pt reports she always uses the cane both at home and out in  public.     Hand Dominance   Dominant Hand: Right    Extremity/Trunk Assessment               Lower Extremity Assessment: RLE deficits/detail RLE Deficits / Details: Pt demonstrated sufficient ROM and strength to walk PWB on RLE when ambulating with +1 min guard and a RW. Pt reports extreme pain in RLE.       Communication   Communication: No difficulties  Cognition Arousal/Alertness: Awake/alert Behavior During Therapy: Flat affect (very teary ) Overall Cognitive Status: Within Functional Limits for tasks assessed                      General Comments General comments (skin integrity, edema, etc.): Pt is very tearful especially once trying to stand from sitting EOB.           Assessment/Plan    PT Assessment Patient needs continued PT services  PT Diagnosis Difficulty walking;Acute pain;Generalized weakness   PT Problem List Decreased strength;Decreased activity tolerance;Decreased balance;Decreased mobility;Decreased knowledge of use of DME;Pain;Decreased skin integrity  PT Treatment Interventions DME instruction;Gait training;Functional mobility training;Therapeutic activities;Therapeutic exercise;Balance training;Patient/family education   PT Goals (Current goals can be found in the Care Plan section) Acute Rehab PT Goals Patient Stated Goal: decrease pain  PT Goal Formulation: With patient Time For Goal Achievement: 11/08/15 Potential to Achieve Goals: Good    Frequency Min 2X/week   Barriers to discharge Decreased caregiver support         End of Session Equipment Utilized During Treatment: Gait belt Activity Tolerance: Patient limited by pain Patient left: in chair;with call bell/phone within reach;with chair alarm set Nurse Communication: Mobility status         Time: 4327-6147 PT Time Calculation (min) (ACUTE ONLY): 26 min   Charges:  1 mod evaluation, 1 gait  PT Evaluation $PT Eval Moderate Complexity: 1 Procedure PT  Treatments $Gait Training: 8-22 mins        [place], [place] office  Arelia Sneddon 10/25/2015, 4:32 PM

## 2015-10-25 NOTE — Progress Notes (Signed)
CSW received referral.  CSW met with the Pt at the bedside.   Assessment to follow.   Pete Pelt, [facility]  [phone]

## 2015-10-25 NOTE — Progress Notes (Signed)
Subjective: Patient seen and examined this morning on rounds.  No acute events overnight.  She is feeling much better today, pain is controlled.  Wound care came by yesterday and did a dressing change.  Objective: Vital signs in last 24 hours: Filed Vitals:   10/24/15 0526 10/24/15 1424 10/24/15 2221 10/25/15 0557  BP: 119/75 121/73 110/67 131/86  Pulse: 85 100 88 65  Temp: 99.1 F (37.3 C) 98.4 F (36.9 C) 98.2 F (36.8 C) 98.4 F (36.9 C)  TempSrc: Oral Oral Oral Oral  Resp: 18 18 18 18   Weight:      SpO2: 100% 100% 100% 100%   Weight change:   Intake/Output Summary (Last 24 hours) at 10/25/15 1100 Last data filed at 10/25/15 0945  Gross per 24 hour  Intake   1044 ml  Output   2000 ml  Net   -956 ml   General: frail appearing, pleasant, resting in bed, eating breakfast, not in distress HEENT: EOMI, no scleral icterus Pulm: normal work of breathing Ext: warm and well perfused, wound to right posterior right leg with non-adherent bandage covering with no drainage through the bandage.  Other chronic wounds present over much of her extremities. Neuro: alert and oriented X3, no focal deficits  Lab Results: Basic Metabolic Panel:  Recent Labs Lab 10/24/15 0453 10/25/15 0514  NA 141 140  K 3.4* 4.6  CL 109 110  CO2 25 25  GLUCOSE 109* 106*  BUN 20 18  CREATININE 1.21* 1.02*  CALCIUM 8.1* 8.5*   Liver Function Tests:  Recent Labs Lab 10/23/15 1420  AST 16  ALT 11*  ALKPHOS 58  BILITOT 0.6  PROT 6.3*  ALBUMIN 3.1*   No results for input(s): LIPASE, AMYLASE in the last 168 hours. No results for input(s): AMMONIA in the last 168 hours. CBC:  Recent Labs Lab 10/23/15 1420 10/24/15 0453 10/25/15 0514  WBC 14.9* 7.3 9.5  NEUTROABS 11.2* 4.7  --   HGB 13.2 10.8* 10.4*  HCT 39.8 33.6* 33.1*  MCV 86.1 85.9 88.5  PLT 370 257 215   Cardiac Enzymes: No results for input(s): CKTOTAL, CKMB, CKMBINDEX, TROPONINI in the last 168 hours. BNP: No results  for input(s): PROBNP in the last 168 hours. D-Dimer: No results for input(s): DDIMER in the last 168 hours. CBG: No results for input(s): GLUCAP in the last 168 hours. Hemoglobin A1C: No results for input(s): HGBA1C in the last 168 hours. Fasting Lipid Panel: No results for input(s): CHOL, HDL, LDLCALC, TRIG, CHOLHDL, LDLDIRECT in the last 168 hours. Thyroid Function Tests: No results for input(s): TSH, T4TOTAL, FREET4, T3FREE, THYROIDAB in the last 168 hours. Coagulation: No results for input(s): LABPROT, INR in the last 168 hours. Anemia Panel: No results for input(s): VITAMINB12, FOLATE, FERRITIN, TIBC, IRON, RETICCTPCT in the last 168 hours. Urine Drug Screen: Drugs of Abuse     Component Value Date/Time   LABOPIA NONE DETECTED 09/25/2015 0428   LABOPIA PPS 08/31/2012 1045   COCAINSCRNUR POSITIVE* 09/25/2015 0428   COCAINSCRNUR NEG 08/31/2012 1045   LABBENZ NONE DETECTED 09/25/2015 0428   LABBENZ NEG 08/31/2012 1045   LABBENZ NEGATIVE 06/12/2011 0221   AMPHETMU NONE DETECTED 09/25/2015 0428   AMPHETMU NEG 08/31/2012 1045   AMPHETMU NEGATIVE 06/12/2011 0221   THCU NONE DETECTED 09/25/2015 0428   THCU NEG 08/31/2012 1045   THCU 63.4* 05/20/2012 1405   LABBARB NONE DETECTED 09/25/2015 0428   LABBARB NEG 08/31/2012 1045    Alcohol Level: No results for  input(s): ETH in the last 168 hours. Urinalysis: No results for input(s): COLORURINE, LABSPEC, PHURINE, GLUCOSEU, HGBUR, BILIRUBINUR, KETONESUR, PROTEINUR, UROBILINOGEN, NITRITE, LEUKOCYTESUR in the last 168 hours.  Invalid input(s): APPERANCEUR  Micro Results: Recent Results (from the past 240 hour(s))  Wound culture     Status: None (Preliminary result)   Collection Time: 10/23/15  4:27 PM  Result Value Ref Range Status   Specimen Description WOUND RIGHT LEG  Final   Special Requests NONE  Final   Gram Stain   Final    RARE WBC PRESENT, PREDOMINANTLY MONONUCLEAR NO SQUAMOUS EPITHELIAL CELLS SEEN MODERATE GRAM  NEGATIVE RODS Performed at [facility]    Culture   Final    Culture reincubated for better growth Performed at [facility]    Report Status PENDING  Incomplete  Culture, blood (Routine X 2) w Reflex to ID Panel     Status: None (Preliminary result)   Collection Time: 10/23/15  4:50 PM  Result Value Ref Range Status   Specimen Description BLOOD RIGHT ANTECUBITAL  Final   Special Requests BOTTLES DRAWN AEROBIC AND ANAEROBIC 5CC  Final   Culture NO GROWTH < 24 HOURS  Final   Report Status PENDING  Incomplete  Culture, blood (Routine X 2) w Reflex to ID Panel     Status: None (Preliminary result)   Collection Time: 10/23/15  5:00 PM  Result Value Ref Range Status   Specimen Description BLOOD LEFT HAND  Final   Special Requests BOTTLES DRAWN AEROBIC AND ANAEROBIC 10CC  Final   Culture NO GROWTH < 24 HOURS  Final   Report Status PENDING  Incomplete   Studies/Results: No results found. Medications:  Scheduled Meds: . cefTRIAXone (ROCEPHIN) 1 g IVPB  1 g Intravenous Q24H  . enoxaparin (LOVENOX) injection  20 mg Subcutaneous Q24H  . feeding supplement  1 Container Oral TID BM  . feeding supplement (PRO-STAT SUGAR FREE 64)  30 mL Oral Daily  . multivitamin with minerals  1 tablet Oral Daily  . potassium chloride  40 mEq Oral BID  . predniSONE  40 mg Oral Q breakfast  . vancomycin  750 mg Intravenous Q24H   Continuous Infusions:   PRN Meds:.oxyCODONE Assessment/Plan: Principal Problem:   Cocaine-induced vascular disorder (HCC) Active Problems:   Cocaine dependence (HCC)   Major depression, recurrent (HCC)   Protein-calorie malnutrition, severe (HCC)   Leg wound, left   Infected wound (HCC)   Lactic acidosis  Non-Healing Wound of Right Leg: Secondary to Levimasole induced vasculitis from ongoing cocaine abuse. Leukocytosis this morning is continued to be stable, Tmax last 24 hours is 98.4 - continue trending CBC and fever curve - continue vancomycin and  ceftriaxone (start date 1/18)  - continue oxycodone 97m q6h as needed for pain - WOC consult appreciated - Wound cx Gram stain with moderate GNR, culture reincubated - Blood cx NG <24 hours - continue Prednisone 457mdaily, then begin to slowly taper - PT evaluation pending - CSW contacted today to begin looking for SNF placement to help this patient receive proper wound care  Acute Kidney Injury: creatine elevated to 1.8 on admission with baseline approximately 0.75 - d/c IV fluids - Trend BMP, creatine improved to 1.02 this morning  Cocaine Abuse - Social work consult  FEN Fluids: none Electrolytes: potassium 4.6 today, monitor and replace as needed Nutrition: Regular diet  DVT PPx: Lovenox  CODE: Full  Dispo: Disposition is deferred at this time, awaiting improvement of current medical  problems.    The patient does have a current PCP (Zada Finders, MD) and does need an [facility] hospital follow-up appointment after discharge.  The patient does have transportation limitations that hinder transportation to clinic appointments.  .Services Needed at time of discharge: Y = Yes, Blank = No PT:   OT:   RN:   Equipment:   Other:     LOS: 2 days   Jule Ser, DO 10/25/2015, 11:00 AM

## 2015-10-25 NOTE — NC FL2 (Signed)
[place] LEVEL OF CARE SCREENING TOOL     IDENTIFICATION  Patient Name: [name] Birthdate: January 22, 1961 Sex: female Admission Date (Current Location): 10/23/2015  [facility] and [place] Number:      Facility and Address:         Provider Number:    Attending Physician Name and Address:  Annia Belt, MD  Relative Name and Phone Number:       Current Level of Care:   Recommended Level of Care:   Prior Approval Number:    Date Approved/Denied:   PASRR Number:    Discharge Plan:      Current Diagnoses: Patient Active Problem List   Diagnosis Date Noted  . Lactic acidosis 10/23/2015  . Infected wound ([place])   . Abscess   . Leg ulcer ([place])   . Wound infection ([place]) 06/14/2015  . Leg wound, left   . Skin ulcer ([place]) 06/04/2015  . Dysuria 04/29/2015  . Pressure ulcer 04/22/2015  . Skin necrosis 04/21/2015  . Other chest pain   . Chest pain 04/07/2015  . Foot infection 03/09/2015  . Protein-calorie malnutrition, severe ([place]) 01/15/2015  . Cocaine abuse   . Swelling of finger, right 01/14/2015  . Foot ulceration ([place]) 01/14/2015  . Severe recurrent major depression without psychotic features ([place]) 10/16/2014  . Cocaine use disorder, severe, dependence ([place]) 10/10/2014  . Cannabis use disorder, severe, dependence ([place]) 10/10/2014  . Substance or medication-induced depressive disorder with onset during intoxication ([place])   . Substance induced mood disorder ([place]) 10/09/2014  . Major depressive disorder, single episode, severe without psychotic features ([place])   . 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 Vasculitis ([place]) 04/19/2014  . Hepatomegaly 04/11/2014  . Acute pericarditis 03/31/2014  . Major depression, recurrent ([place]) 01/13/2014  . Inflammatory arthritis ([place]) 09/24/2013  . Cocaine-induced vascular disorder ([place]) 06/19/2013  . Cocaine dependence ([place]) 05/15/2013  . Tobacco abuse 01/03/2013  . hyperlipidemia 01/03/2013  . Essential  hypertension 02/26/2010    Orientation RESPIRATION BLADDER Height & Weight       Normal Continent   92 lbs.  BEHAVIORAL SYMPTOMS/MOOD NEUROLOGICAL BOWEL NUTRITION STATUS      Continent Diet (regular)  AMBULATORY STATUS COMMUNICATION OF NEEDS Skin   Extensive Assist Verbally  (Multiple wound sites.)                       Personal Care Assistance Level of Assistance        Dressing Assistance: Limited assistance     Functional Limitations Info             SPECIAL CARE FACTORS FREQUENCY  PT (By licensed PT)     PT Frequency: minimum x2 weekly              Contractures Contractures Info: Not present    Additional Factors Info    Code Status Info: Full Code Allergies Info: Acetaminophen            Current Medications (10/25/2015):  This is the current hospital active medication list Current Facility-Administered Medications  Medication Dose Route Frequency Provider Last Rate Last Dose  . cefTRIAXone (ROCEPHIN) 1 g in dextrose 5 % 50 mL IVPB  1 g Intravenous Q24H Annia Belt, MD   1 g at 10/25/15 1728  . enoxaparin (LOVENOX) injection 20 mg  20 mg Subcutaneous Q24H Kendra P Hiatt, RPH   20 mg at 10/24/15 1659  . feeding supplement (BOOST / RESOURCE BREEZE) liquid 1  Container  1 Container Oral TID BM Domenick Bookbinder, RD   [phone] Container at 10/25/15 [phone]  . feeding supplement (PRO-STAT SUGAR FREE 64) liquid 30 mL  30 mL Oral Daily Jenifer A Williams, RD   30 mL at 10/25/15 0754  . multivitamin with minerals tablet 1 tablet  1 tablet Oral Daily Domenick Bookbinder, RD   1 tablet at 10/25/15 0754  . oxyCODONE (Oxy IR/ROXICODONE) immediate release tablet 5 mg  5 mg Oral Q6H PRN Juliet Rude, MD   5 mg at 10/25/15 1453  . potassium chloride SA (K-DUR,KLOR-CON) CR tablet 40 mEq  40 mEq Oral BID Jule Ser, DO   40 mEq at 10/25/15 0754  . predniSONE (DELTASONE) tablet 40 mg  40 mg Oral Q breakfast Juliet Rude, MD   40 mg at 10/25/15 0754  .  vancomycin (VANCOCIN) IVPB 750 mg/150 ml premix  750 mg Intravenous Q24H Jaquita Folds, RPH   750 mg at 10/24/15 2032     Discharge Medications: Please see discharge summary for a list of discharge medications.  Relevant Imaging Results:  Relevant Lab Results:   Additional Information [id number]  Pete Pelt

## 2015-10-25 NOTE — Clinical Social Work Placement (Signed)
   CLINICAL SOCIAL WORK PLACEMENT  NOTE  Date:  10/25/2015  Patient Details  Name: [name] MRN: [id number] Date of Birth: [birth year]  Clinical Social Work is seeking post-discharge placement for this patient at the [place] level of care (*CSW will initial, date and re-position this form in  chart as items are completed):  Yes   Patient/family provided with [place] Work Department's list of facilities offering this level of care within the geographic area requested by the patient (or if unable, by the patient's family).  Yes   Patient/family informed of their freedom to choose among providers that offer the needed level of care, that participate in Medicare, Medicaid or managed care program needed by the patient, have an available bed and are willing to accept the patient.  Yes   Patient/family informed of [place]'s ownership interest in [facility] and [facility], as well as of the fact that they are under no obligation to receive care at these facilities.  PASRR submitted to EDS on       PASRR number received on       Existing PASRR number confirmed on 10/25/15     FL2 transmitted to all facilities in geographic area requested by pt/family on 10/25/15     FL2 transmitted to all facilities within larger geographic area on       Patient informed that his/her managed care company has contracts with or will negotiate with certain facilities, including the following:            Patient/family informed of bed offers received.  Patient chooses bed at       Physician recommends and patient chooses bed at      Patient to be transferred to   on  .  Patient to be transferred to facility by       Patient family notified on   of transfer.  Name of family member notified:        PHYSICIAN       Additional Comment:    _______________________________________________ Pete Pelt 10/25/2015, 5:35 PM

## 2015-10-25 NOTE — Clinical Social Work Note (Signed)
Clinical Social Work Assessment  Patient Details  Name: [name] MRN: [id number] Date of Birth: [birth year]  Date of referral:  10/25/15               Reason for consult:  Facility Placement                Permission sought to share information with:  Chartered certified accountant granted to share information::  Yes, Verbal Permission Granted  Name::        Agency::   [facility] SNF's)  Relationship::     Contact Information:   (None at this time)  Housing/Transportation Living arrangements for the past 2 months:   (Living with her brother's friends) Source of Information:  Patient Patient Interpreter Needed:  None Criminal Activity/Legal Involvement Pertinent to Current Situation/Hospitalization:  No - Comment as needed Significant Relationships:  Siblings, Friend Lives with:  Relatives, Friends Do you feel safe going back to the place where you live?  No (Pt feels she needs to go somewhere that she can receive medical care for her infection) Need for family participation in patient care:  No (Coment)  Care giving concerns:  Pt stated she does not have anyone to help her with her wounds.   Social Worker assessment / plan:  Pt came back in after being discharged three weeks ago with a wound infection. CSW went in to see Pt and reintroduce self to Pt. Pt stated she remembered CSW from her last admission. CSW informed Pt that CSW received a referral the same as her last admission, and that was to go to a SNF facility for wound care and rehab if possible. CSW questioned Pt as to what occurred after being discharged from the hospital. The Pt did not want to elaborate with CSW as to what brought her back in. Pt stated, "I will go to a skilled facility this time, cause I'm not gonna get better if I don't." CSW agreed with Pt and questioned Pt on her current living situation. Pt stated that she is now staying with her brother and his friends in an apartment. Pt would not  say why she is no longer living in the boarding home. CSW requested permission to send Pt information to the [facility] area for possible SNF placement. Pt agreed to allow CSW to conduct search. CSW will send Pt information to all [employer]. Weekend CSW's to follow up with bed offers and choice of facility.   Employment status:  Disabled (Comment on whether or not currently receiving Disability) Insurance information:  Medicaid In [place] PT Recommendations:  [place] / Referral to community resources:  [place]  Patient/Family's Response to care:  Pt thanked CSW for visit and assistance with Placement.  Patient/Family's Understanding of and Emotional Response to Diagnosis, Current Treatment, and Prognosis:  Pt realizes on this admission that placement is necessary if she is going to get better from this infection.   Emotional Assessment Appearance:    Attitude/Demeanor/Rapport:    Affect (typically observed):  Tearful/Crying, Appropriate, Calm, Overwhelmed Orientation:  Oriented to Self, Oriented to Place, Oriented to  Time, Oriented to Situation Alcohol / Substance use:    Psych involvement (Current and /or in the community):  No (Comment)  Discharge Needs  Concerns to be addressed:  Substance Abuse Concerns, Compliance Issues Concerns, Adjustment to Illness, Financial / Insurance Concerns (Cocaine ) Readmission within the last 30 days:  Yes (Pt refused SNF three weeks ago  and came back with the same medical concerns) Current discharge risk:  Substance Abuse (Wound care concerns and cocaine abuse) Barriers to Discharge:  Active Substance Use, Continued Medical Work up   [employer] 10/25/2015, 5:19 PM

## 2015-10-26 LAB — BASIC METABOLIC PANEL
ANION GAP: 5 (ref 5–15)
BUN: 17 mg/dL (ref 6–20)
CO2: 28 mmol/L (ref 22–32)
Calcium: 8.8 mg/dL — ABNORMAL LOW (ref 8.9–10.3)
Chloride: 106 mmol/L (ref 101–111)
Creatinine, Ser: 0.96 mg/dL (ref 0.44–1.00)
GFR calc Af Amer: >60 mL/min (ref >60)
GLUCOSE: 89 mg/dL (ref 65–99)
POTASSIUM: 4.7 mmol/L (ref 3.5–5.1)
Sodium: 139 mmol/L (ref 135–145)

## 2015-10-26 LAB — WOUND CULTURE

## 2015-10-26 LAB — CBC
HEMATOCRIT: 32 % — AB (ref 36.0–46.0)
HEMOGLOBIN: 10.5 g/dL — AB (ref 12.0–15.0)
MCH: 28.5 pg (ref 26.0–34.0)
MCHC: 32.8 g/dL (ref 30.0–36.0)
MCV: 86.7 fL (ref 78.0–100.0)
Platelets: 240 10*3/uL (ref 150–400)
RBC: 3.69 MIL/uL — AB (ref 3.87–5.11)
RDW: 14 % (ref 11.5–15.5)
WBC: 10.2 10*3/uL (ref 4.0–10.5)

## 2015-10-26 MED ORDER — OXYCODONE HCL 5 MG PO TABS
5.0000 mg | ORAL_TABLET | Freq: Once | ORAL | Status: AC
  Administered 2015-10-26: 5 mg via ORAL
  Filled 2015-10-26: qty 1

## 2015-10-26 MED ORDER — OXYCODONE HCL 5 MG PO TABS
5.0000 mg | ORAL_TABLET | ORAL | Status: DC | PRN
  Administered 2015-10-26 – 2015-10-29 (×10): 5 mg via ORAL
  Filled 2015-10-26 (×11): qty 1

## 2015-10-26 NOTE — Progress Notes (Signed)
Subjective: No acute events overnight. She reports she had a lot of pain last night and had difficulty sleeping. She also notes while working with PT yesterday she had a lot of drainage from her leg wound. She states the drainage has subsided this morning.   Objective: Vital signs in last 24 hours: Filed Vitals:   10/25/15 1604 10/25/15 2257 10/26/15 0549 10/26/15 0700  BP: 124/64 150/96 143/76   Pulse: 74 67 54   Temp: 98.1 F (36.7 C) 98.6 F (37 C) 98.1 F (36.7 C)   TempSrc: Oral Oral Oral   Resp: 18 18 18    Height:    5' 1"  (1.549 m)  Weight:    92 lb 9.5 oz (42 kg)  SpO2: 100% 100% 100%    Weight change:   Intake/Output Summary (Last 24 hours) at 10/26/15 0950 Last data filed at 10/26/15 0940  Gross per 24 hour  Intake   1080 ml  Output   3500 ml  Net  -2420 ml   General: frail appearing, pleasant, resting in bed HEENT: EOMI, no scleral icterus Pulm: normal work of breathing Ext: warm and well perfused, wound to right posterior right leg with non-adherent bandage covering with no drainage through the bandage.  Other chronic wounds present over much of her extremities. Neuro: alert and oriented X3  Lab Results: Basic Metabolic Panel:  Recent Labs Lab 10/25/15 0514 10/26/15 0545  NA 140 139  K 4.6 4.7  CL 110 106  CO2 25 28  GLUCOSE 106* 89  BUN 18 17  CREATININE 1.02* 0.96  CALCIUM 8.5* 8.8*   Liver Function Tests:  Recent Labs Lab 10/23/15 1420  AST 16  ALT 11*  ALKPHOS 58  BILITOT 0.6  PROT 6.3*  ALBUMIN 3.1*   CBC:  Recent Labs Lab 10/23/15 1420 10/24/15 0453 10/25/15 0514 10/26/15 0545  WBC 14.9* 7.3 9.5 10.2  NEUTROABS 11.2* 4.7  --   --   HGB 13.2 10.8* 10.4* 10.5*  HCT 39.8 33.6* 33.1* 32.0*  MCV 86.1 85.9 88.5 86.7  PLT 370 257 215 240   Micro Results: Recent Results (from the past 240 hour(s))  Wound culture     Status: None (Preliminary result)   Collection Time: 10/23/15  4:27 PM  Result Value Ref Range Status   Specimen Description WOUND RIGHT LEG  Final   Special Requests NONE  Final   Gram Stain   Final    RARE WBC PRESENT, PREDOMINANTLY MONONUCLEAR NO SQUAMOUS EPITHELIAL CELLS SEEN MODERATE GRAM NEGATIVE RODS Performed at [employer]   Final    Culture reincubated for better growth Performed at [facility]    Report Status PENDING  Incomplete  Culture, blood (Routine X 2) w Reflex to ID Panel     Status: None (Preliminary result)   Collection Time: 10/23/15  4:50 PM  Result Value Ref Range Status   Specimen Description BLOOD RIGHT ANTECUBITAL  Final   Special Requests BOTTLES DRAWN AEROBIC AND ANAEROBIC 5CC  Final   Culture NO GROWTH 2 DAYS  Final   Report Status PENDING  Incomplete  Culture, blood (Routine X 2) w Reflex to ID Panel     Status: None (Preliminary result)   Collection Time: 10/23/15  5:00 PM  Result Value Ref Range Status   Specimen Description BLOOD LEFT HAND  Final   Special Requests BOTTLES DRAWN AEROBIC AND ANAEROBIC 10CC  Final   Culture NO GROWTH 2 DAYS  Final   Report Status PENDING  Incomplete   Medications:  Scheduled Meds: . cefTRIAXone (ROCEPHIN) 1 g IVPB  1 g Intravenous Q24H  . enoxaparin (LOVENOX) injection  20 mg Subcutaneous Q24H  . feeding supplement  1 Container Oral TID BM  . feeding supplement (PRO-STAT SUGAR FREE 64)  30 mL Oral Daily  . multivitamin with minerals  1 tablet Oral Daily  . potassium chloride  40 mEq Oral BID  . predniSONE  40 mg Oral Q breakfast  . 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 Afebrile, no leukocytosis.  - Continue vancomycin and ceftriaxone (start date 1/18). Switch to orals upon discharge to SNF. - Continue oxycodone 31m q6h as needed for pain - WOC consult appreciated - Wound cx Gram stain with moderate GNR, culture  reincubated - Blood cx with NGTD - Continue Prednisone 450mdaily, then begin to slowly taper - PT recommends SNF - CSW working on SNF placement   Acute Kidney Injury: Resolved. Cr 1.8 on admission, now improved to 0.96 after IVFs and patient eating well.   Cocaine Abuse - Social work consulted  FEN Fluids: none Electrolytes: monitor and replace as needed Nutrition: Regular diet  DVT PPx: Lovenox CODE: Full Dispo: Discharge likely in next 1-2 days. Awaiting SNF bed.   The patient does have a current PCP (ViZada FindersMD) and [facility].  The patient does have transportation limitations that hinder transportation to clinic appointments.  .Services Needed at time of discharge: Y = Yes, Blank = No PT:   OT:   RN:   Equipment:   Other:     LOS: 3 days   CaJuliet RudeMD 10/26/2015, 9:50 AM

## 2015-10-27 LAB — MRSA PCR SCREENING: MRSA BY PCR: NEGATIVE

## 2015-10-27 MED ORDER — DOXYCYCLINE HYCLATE 100 MG PO TABS
100.0000 mg | ORAL_TABLET | Freq: Two times a day (BID) | ORAL | Status: DC
  Administered 2015-10-27 – 2015-10-29 (×5): 100 mg via ORAL
  Filled 2015-10-27 (×5): qty 1

## 2015-10-27 NOTE — Progress Notes (Signed)
Subjective: Patient seen and examined this morning.  No acute events overnight.  States her leg hurt more last night than previous days, but is better this morning.  No other complaints, still agreeable to SNF placement.  Okay for discharge once placement found.  Objective: Vital signs in last 24 hours: Filed Vitals:   10/26/15 0700 10/26/15 1409 10/26/15 1900 10/27/15 0510  BP:  127/72 112/64 138/77  Pulse:  75 69 50  Temp:  98.2 F (36.8 C) 98.2 F (36.8 C) 97.9 F (36.6 C)  TempSrc:  Oral Oral Oral  Resp:  18 17 17   Height: 5' 1"  (1.549 m)     Weight: 42 kg (92 lb 9.5 oz)     SpO2:  100% 100% 100%   Weight change:   Intake/Output Summary (Last 24 hours) at 10/27/15 1610 Last data filed at 10/27/15 0511  Gross per 24 hour  Intake   1240 ml  Output   2550 ml  Net  -1310 ml   General: frail appearing, pleasant, resting in bed, watching TV HEENT: EOMI, no scleral icterus Pulm: normal work of breathing Ext: warm and well perfused, wound to right posterior right leg with non-adherent bandage covering with no drainage through the bandage.  Other chronic wounds present over much of her extremities. Neuro: alert and oriented X3, no focal deficits  Lab Results: Basic Metabolic Panel:  Recent Labs Lab 10/25/15 0514 10/26/15 0545  NA 140 139  K 4.6 4.7  CL 110 106  CO2 25 28  GLUCOSE 106* 89  BUN 18 17  CREATININE 1.02* 0.96  CALCIUM 8.5* 8.8*   Liver Function Tests:  Recent Labs Lab 10/23/15 1420  AST 16  ALT 11*  ALKPHOS 58  BILITOT 0.6  PROT 6.3*  ALBUMIN 3.1*   CBC:  Recent Labs Lab 10/23/15 1420 10/24/15 0453 10/25/15 0514 10/26/15 0545  WBC 14.9* 7.3 9.5 10.2  NEUTROABS 11.2* 4.7  --   --   HGB 13.2 10.8* 10.4* 10.5*  HCT 39.8 33.6* 33.1* 32.0*  MCV 86.1 85.9 88.5 86.7  PLT 370 257 215 240   Micro Results: Recent Results (from the past 240 hour(s))  Wound culture     Status: None   Collection Time: 10/23/15  4:27 PM  Result Value Ref  Range Status   Specimen Description WOUND RIGHT LEG  Final   Special Requests NONE  Final   Gram Stain   Final    RARE WBC PRESENT, PREDOMINANTLY MONONUCLEAR NO SQUAMOUS EPITHELIAL CELLS SEEN MODERATE GRAM NEGATIVE RODS Performed at [facility]    Culture   Final    MULTIPLE ORGANISMS PRESENT, NONE PREDOMINANT Note: NO STAPHYLOCOCCUS AUREUS ISOLATED NO GROUP A STREP (S.PYOGENES) ISOLATED Performed at [facility]    Report Status 10/26/2015 FINAL  Final  Culture, blood (Routine X 2) w Reflex to ID Panel     Status: None (Preliminary result)   Collection Time: 10/23/15  4:50 PM  Result Value Ref Range Status   Specimen Description BLOOD RIGHT ANTECUBITAL  Final   Special Requests BOTTLES DRAWN AEROBIC AND ANAEROBIC 5CC  Final   Culture NO GROWTH 3 DAYS  Final   Report Status PENDING  Incomplete  Culture, blood (Routine X 2) w Reflex to ID Panel     Status: None (Preliminary result)   Collection Time: 10/23/15  5:00 PM  Result Value Ref Range Status   Specimen Description BLOOD LEFT HAND  Final   Special Requests BOTTLES DRAWN  AEROBIC AND ANAEROBIC 10CC  Final   Culture NO GROWTH 3 DAYS  Final   Report Status PENDING  Incomplete   Medications:  Scheduled Meds: . cefTRIAXone (ROCEPHIN) 1 g IVPB  1 g Intravenous Q24H  . enoxaparin (LOVENOX) injection  20 mg Subcutaneous Q24H  . feeding supplement  1 Container Oral TID BM  . feeding supplement (PRO-STAT SUGAR FREE 64)  30 mL Oral Daily  . multivitamin with minerals  1 tablet Oral Daily  . potassium chloride  40 mEq Oral BID  . predniSONE  40 mg Oral Q breakfast  . 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 Afebrile, no leukocytosis.  - Continue vancomycin and ceftriaxone (start date 1/18). Switch to orals upon discharge to SNF for 7 days. - Continue  oxycodone 81m q6h as needed for pain - WOC consult appreciated - Wound cx Gram stain with moderate GNR, culture with multiple organisms present, none predominant.  No Staph or Strep isolated - Blood cx with NGTD - Continue Prednisone 468mdaily, then begin to slowly taper - PT recommends SNF - CSW working on SNF placement, okay for discharge once SNF found  Acute Kidney Injury: Resolved. Cr 1.8 on admission, now improved to 0.96 yesterday after IVFs and patient eating well.  No new labs this morning.  Cocaine Abuse - Social work consulted  FEN Fluids: none Electrolytes: monitor and replace as needed Nutrition: Regular diet  DVT PPx: Lovenox CODE: Full Dispo: Discharge likely in next 1-2 days. Awaiting SNF bed.   The patient does have a current PCP (ViZada FindersMD) and [facility].  The patient does have transportation limitations that hinder transportation to clinic appointments.  .Services Needed at time of discharge: Y = Yes, Blank = No PT:   OT:   RN:   Equipment:   Other:     LOS: 4 days   AnJule SerDO 10/27/2015, 9:04 AM

## 2015-10-27 NOTE — Progress Notes (Signed)
Pharmacy Antibiotic Follow-up Note  [name] is a 55 y.o. year-old female admitted on 10/23/2015.  The patient is currently on day #5 of abx for non-healing wound on R leg.  Assessment/Plan: Day #5 of abx for non-healing leg wound, foul-smelling drainage.  Had not presented at [facility] for f/u pta.  LA wnl this AM.  Afebrile, WBC wnl. SCr stable s/p hydration, CrCl ~93m/min. IM plan is to continue IV abx until discharge and then switch to PO abx for another week. Just awaiting placement but medically ready for discharge.  Plan: Continue ceftriaxone 1g IV Q24 Continue vancomycin 756mIV Q24 Monitor clinical picture, renal function, VT prn F/U C&S, abx deescalation / LOT  Temp (24hrs), Avg:98.1 F (36.7 C), Min:97.9 F (36.6 C), Max:98.2 F (36.8 C)   Recent Labs Lab 10/23/15 1420 10/24/15 0453 10/25/15 0514 10/26/15 0545  WBC 14.9* 7.3 9.5 10.2    Recent Labs Lab 10/23/15 1420 10/24/15 0453 10/25/15 0514 10/26/15 0545  CREATININE 1.80* 1.21* 1.02* 0.96   Estimated Creatinine Clearance: 45.5 mL/min (by C-G formula based on Cr of 0.96).    Allergies  Allergen Reactions  . Acetaminophen Swelling    REACTION: eyelid swelling    Antimicrobials this admission: CTX 10/23/2015 >>  Vanc 10/23/2015 >>  Levels/dose changes this admission: n/a  Microbiology results: 1/18  blood x 2:  ngtd 1/18  wound: multiple organisms present MRSA PCR + Staph Aureus screen +  Thank you for allowing pharmacy to be a part of this patient's care.  NaElenor QuinonesPharmD, BCPS Clinical Pharmacist Pager [phone]/22/2017 9:15 AM

## 2015-10-28 LAB — CULTURE, BLOOD (ROUTINE X 2)
CULTURE: NO GROWTH
CULTURE: NO GROWTH

## 2015-10-28 MED ORDER — POTASSIUM CHLORIDE CRYS ER 20 MEQ PO TBCR: 40.0000 meq | EXTENDED_RELEASE_TABLET | Freq: Two times a day (BID) | ORAL | Status: DC

## 2015-10-28 MED ORDER — BOOST / RESOURCE BREEZE PO LIQD: 1.0000 | Freq: Three times a day (TID) | ORAL | Status: DC

## 2015-10-28 MED ORDER — PRO-STAT SUGAR FREE PO LIQD: 30.0000 mL | Freq: Every day | ORAL | Status: DC

## 2015-10-28 MED ORDER — PREDNISONE 10 MG PO TABS: ORAL_TABLET | ORAL | Status: DC

## 2015-10-28 MED ORDER — IBUPROFEN 200 MG PO TABS: 400.0000 mg | ORAL_TABLET | Freq: Four times a day (QID) | ORAL | Status: DC | PRN

## 2015-10-28 MED ORDER — POTASSIUM CHLORIDE CRYS ER 20 MEQ PO TBCR: 20.0000 meq | EXTENDED_RELEASE_TABLET | Freq: Two times a day (BID) | ORAL | Status: DC

## 2015-10-28 MED ORDER — ADULT MULTIVITAMIN W/MINERALS CH: 1.0000 | ORAL_TABLET | Freq: Every day | ORAL | Status: DC

## 2015-10-28 NOTE — Care Management (Addendum)
Mr [name] returned call , [name] unable to resume care due to noncompliance .    Spoke with [name] with [facility] to say if they are able to resume home health RN . He will call [place] office and call case manager back . Awaiting call back.   Spoke with [name] regarding possible home with home health .  [name] saids she has to leave today either to SNF or home with home health . Asked if she goes home with home health what address would she be discharging to , [name] unsure possibly [address] ( [name] can't remember street name) . Asked if she had a cell phone number [name] stated no she "left it in some girl's car". Explained to [name] to set up home health I will need a address and phone number before she discharges. [name] voiced understanding.    Cristina Spatz RN BSN [phone]

## 2015-10-28 NOTE — Progress Notes (Signed)
Physical Therapy Treatment Patient Details Name: [name] MRN: [id number] DOB: 03/30/1961 Today's Date: 10/28/2015    History of Present Illness Pt is a 55 y.o. female admitted on 10/23/15 with recurrent levamisole-induced vasculitis of RLE. Pt had a recent admission for RLE I&D with wound vac placement on 09/26/15. Pt no longer has wound vac in place. Pt has other significant PMH of HTN, cocaine abuse, and RA.    PT Comments    Pt with improved mobility this session but with balance deficits during transfers and ambulation. No significant LOB. Pt continues to be limited slightly by pain.   Follow Up Recommendations  SNF     Equipment Recommendations  Rolling walker with 5" wheels    Recommendations for Other Services       Precautions / Restrictions Precautions Precautions: Fall Restrictions Weight Bearing Restrictions: No    Mobility  Bed Mobility Overal bed mobility: Modified Independent             General bed mobility comments: use of bed rails and increased time to get to EOB  Transfers Overall transfer level: Needs assistance   Transfers: Sit to/from Stand Sit to Stand: Supervision         General transfer comment: supervision for safety; no unsteadiness or LOB; no AD  Ambulation/Gait Ambulation/Gait assistance: Supervision;Min guard Ambulation Distance (Feet): 80 Feet Assistive device: None Gait Pattern/deviations: Step-through pattern;Decreased stride length;Antalgic;Decreased weight shift to right Gait velocity: decreased    General Gait Details: supervision/min guard for safety; pt using single extremity on wall at times when feeling unsteady; no LOB; vc for posture and increased WS to R    Stairs            Wheelchair Mobility    Modified Rankin (Stroke Patients Only)       Balance   Sitting-balance support: Feet supported Sitting balance-Leahy Scale: Good     Standing balance support: No upper extremity  supported Standing balance-Leahy Scale: Good                      Cognition Arousal/Alertness: Awake/alert Behavior During Therapy: Flat affect (very teary ) Overall Cognitive Status: Within Functional Limits for tasks assessed                      Exercises General Exercises - Lower Extremity Ankle Circles/Pumps: AROM;Both;10 reps Long Arc Quad: AROM;Both;10 reps;Seated Hip Flexion/Marching: AROM;Both;10 reps;Seated    General Comments General comments (skin integrity, edema, etc.): pt emotional and crying on and off throughtout tx      Pertinent Vitals/Pain Pain Assessment: Faces Faces Pain Scale: Hurts a little bit Pain Location: RLE Pain Descriptors / Indicators: Aching;Sore Pain Intervention(s): Limited activity within patient's tolerance;Monitored during session;Repositioned;Patient requesting pain meds-RN notified    Home Living                      Prior Function            PT Goals (current goals can now be found in the care plan section) Acute Rehab PT Goals Patient Stated Goal: get out of hospital and get own apartment PT Goal Formulation: With patient Time For Goal Achievement: 11/08/15 Potential to Achieve Goals: Good Progress towards PT goals: Progressing toward goals    Frequency  Min 2X/week    PT Plan Current plan remains appropriate    Co-evaluation             End  of Session Equipment Utilized During Treatment: Gait belt Activity Tolerance: Patient limited by pain Patient left: with call bell/phone within reach;in bed;with nursing/sitter in room     Time: 8159-4707 PT Time Calculation (min) (ACUTE ONLY): 28 min  Charges:  $Gait Training: 8-22 mins $Therapeutic Exercise: 8-22 mins                    G Codes:      Salina April, PTA Pager: [phone]   10/28/2015, 4:44 PM

## 2015-10-28 NOTE — Progress Notes (Signed)
Pt. First stated that if she couldn't be placed in a facility that she just wanted to leave. After no facility would take pt., Dr. Juleen China put in Discharge orders. Pt. Then decided she wanted to stay an extra day. MD made aware and discontinued discharge orders.

## 2015-10-28 NOTE — Progress Notes (Signed)
Subjective: Patient seen and examined this morning.  No acute events overnight.  No complaints this morning.  She has been up and moving with physical therapy.  SNF placement found in [facility].  Objective: Vital signs in last 24 hours: Filed Vitals:   10/27/15 0510 10/27/15 1349 10/27/15 2131 10/28/15 0535  BP: 138/77 149/69 131/66 140/70  Pulse: 50 72 62 45  Temp: 97.9 F (36.6 C) 98.2 F (36.8 C) 97.4 F (36.3 C) 97.5 F (36.4 C)  TempSrc: Oral Oral Axillary Axillary  Resp: 17 16 16 16   Height:      Weight:      SpO2: 100% 100% 100% 100%   Weight change:   Intake/Output Summary (Last 24 hours) at 10/28/15 1050 Last data filed at 10/28/15 0600  Gross per 24 hour  Intake    480 ml  Output   1500 ml  Net  -1020 ml   General: frail appearing, pleasant, resting in bed, eating breakfast HEENT: EOMI, no scleral icterus Pulm: normal work of breathing Ext: warm and well perfused, wound to right posterior right leg with non-adherent bandage covering with no drainage through the bandage.  After removing bandage, there is healthy appearing granulation tissue that does not look to be infected.  Other chronic wounds present over much of her extremities. Neuro: alert and oriented X3, no focal deficits  Lab Results: Basic Metabolic Panel:  Recent Labs Lab 10/25/15 0514 10/26/15 0545  NA 140 139  K 4.6 4.7  CL 110 106  CO2 25 28  GLUCOSE 106* 89  BUN 18 17  CREATININE 1.02* 0.96  CALCIUM 8.5* 8.8*   Liver Function Tests:  Recent Labs Lab 10/23/15 1420  AST 16  ALT 11*  ALKPHOS 58  BILITOT 0.6  PROT 6.3*  ALBUMIN 3.1*   CBC:  Recent Labs Lab 10/23/15 1420 10/24/15 0453 10/25/15 0514 10/26/15 0545  WBC 14.9* 7.3 9.5 10.2  NEUTROABS 11.2* 4.7  --   --   HGB 13.2 10.8* 10.4* 10.5*  HCT 39.8 33.6* 33.1* 32.0*  MCV 86.1 85.9 88.5 86.7  PLT 370 257 215 240   Micro Results: Recent Results (from the past 240 hour(s))  Wound culture     Status: None   Collection Time: 10/23/15  4:27 PM  Result Value Ref Range Status   Specimen Description WOUND RIGHT LEG  Final   Special Requests NONE  Final   Gram Stain   Final    RARE WBC PRESENT, PREDOMINANTLY MONONUCLEAR NO SQUAMOUS EPITHELIAL CELLS SEEN MODERATE GRAM NEGATIVE RODS Performed at [facility]    Culture   Final    MULTIPLE ORGANISMS PRESENT, NONE PREDOMINANT Note: NO STAPHYLOCOCCUS AUREUS ISOLATED NO GROUP A STREP (S.PYOGENES) ISOLATED Performed at [facility]    Report Status 10/26/2015 FINAL  Final  Culture, blood (Routine X 2) w Reflex to ID Panel     Status: None (Preliminary result)   Collection Time: 10/23/15  4:50 PM  Result Value Ref Range Status   Specimen Description BLOOD RIGHT ANTECUBITAL  Final   Special Requests BOTTLES DRAWN AEROBIC AND ANAEROBIC 5CC  Final   Culture NO GROWTH 4 DAYS  Final   Report Status PENDING  Incomplete  Culture, blood (Routine X 2) w Reflex to ID Panel     Status: None (Preliminary result)   Collection Time: 10/23/15  5:00 PM  Result Value Ref Range Status   Specimen Description BLOOD LEFT HAND  Final   Special Requests  BOTTLES DRAWN AEROBIC AND ANAEROBIC 10CC  Final   Culture NO GROWTH 4 DAYS  Final   Report Status PENDING  Incomplete  MRSA PCR Screening     Status: None   Collection Time: 10/27/15  5:01 AM  Result Value Ref Range Status   MRSA by PCR NEGATIVE NEGATIVE Final    Comment:        The GeneXpert MRSA Assay (FDA approved for NASAL specimens only), is one component of a comprehensive MRSA colonization surveillance program. It is not intended to diagnose MRSA infection nor to guide or monitor treatment for MRSA infections.    Medications:  Scheduled Meds: . cefTRIAXone (ROCEPHIN) 1 g IVPB  1 g Intravenous Q24H  . doxycycline  100 mg Oral Q12H  . enoxaparin (LOVENOX) injection  20 mg Subcutaneous Q24H  . feeding supplement  1 Container Oral TID BM  . feeding supplement (PRO-STAT SUGAR FREE 64)   30 mL Oral Daily  . multivitamin with minerals  1 tablet Oral Daily  . potassium chloride  40 mEq Oral BID  . 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 Possible pyoderma gangrenosum as well  - Will discontinue antibiotics - Discontinue pain meds, Ibuprofen for pain due to substance abuse history - WOC consult appreciated - Wound cx Gram stain with moderate GNR, culture with multiple organisms present, none predominant.  No Staph or Strep isolated - Blood cx with NGTD - Continue Prednisone 92m daily, then begin to slowly taper - PT recommends SNF - CSW working on SNF placement, spoke with CSW this morning and patient has bed offer so will discharge patient today to SNF  Acute Kidney Injury: Resolved. Cr 1.8 on admission, now improved to 0.96 yesterday after IVFs and patient eating well.  No new labs this morning.  Cocaine Abuse - Social work consulted  FEN Fluids: none Electrolytes: monitor and replace as needed Nutrition: Regular diet  DVT PPx: Lovenox CODE: Full Dispo: Discharge today to SNF.  The patient does have a current PCP (VZada Finders MD) and [facility].  The patient does have transportation limitations that hinder transportation to clinic appointments.  .Services Needed at time of discharge: Y = Yes, Blank = No PT:  yes  OT:   RN:   Equipment:   Other:     LOS: 5 days   AJule Ser DO 10/28/2015, 10:50 AM

## 2015-10-28 NOTE — Discharge Summary (Signed)
Name: [name] MRN: [id number] DOB: 1961/08/08 55 y.o. PCP: Zada Finders, MD  Date of Admission: 10/23/2015  4:09 PM Date of Discharge: 10/29/2015 Attending Physician: No att. 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     feeding supplement Liqd  Take 1 Container by mouth 3 (three) times daily between meals.     multivitamin with minerals Tabs tablet  Take 1 tablet by mouth daily.     potassium chloride SA 20 MEQ tablet  Commonly known as:  K-DUR,KLOR-CON  Take 1 tablet (20 mEq total) by mouth 2 (two) times daily.     predniSONE 10 MG tablet  Commonly known as:  DELTASONE  31m x 2 days, then 352mx 7 days, then 2058m 7 days, then 65m78m days, then off        Disposition and follow-up:   [name]    1.  At the hospital follow up visit please address:  - patient needs to complete prednisone taper as follows from discharge date: 40mg43m days, then 30mg 49mdays, then 20mg x76mays, then 65mg x723ms, then off.  Please ensure she picked up her prescription. - antibiotics discontinued at discharge.  She received broad spectrum antibiotics x 5 days to treat potential infection, however, wound does not look infected at this time and it is most important patient continues to care for her wound with non-adherent dressings daily - pain control: patient discharged on ibuprofen for pain control.  She did receive oxycodone as needed while inpatient.  Decided against discharging on controlled medications  given her history of substance abuse and being 6 days from acute presentation at time of discharge. - consider CSW consult for home health services  2.  Labs / imaging needed at time of follow-up: BMP to monitor electrolytes  3.  Pending labs/ test needing follow-up: final blood cultures  Follow-up Appointments: Follow-up Information    Follow up with Nick A TSherin Quarry1/30/2017.   Specialty:  Internal Medicine   Why:  appointment at 1:15pm   Contact information:   [phone] Instructions:   Consultations:    Procedures Performed:  No results found.  2D Echo: none  Cardiac Cath: none  Admission HPI:  [name].o. 63man well known to the Internal Medicine Teaching Service. 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 She was admitted directly from the Internal Medicine Clinic due to a non-healing, presumably infected wound.  Patient was recently hospitalized from Dec 21-28 where she had extensive debridement of this wound by Plastic Surgery and wound vac placement. She was initially started on TMP-SMX but wound culture was resistant to TMP-SMX and she was converted to Doxycycline to complete 7-day course of antibiotics. It was recommended that she be discharged with wound vac, however, this was deemed  to be cost prohibitive and, thus, recommendations were for daily wet-to-dry dressings at a skilled nursing facility. Patient ultimately refused SNF placement and was discharged to home to provide her own dressing changes and follow up with the [facility]. She unfortunately did not follow up and states there has been no improvement. She presented to the ED on January 8 due to this same wound where she had it mildly debrided, irrigated, dressed, and was provided with dressing material. Was  discharged from the ED with wound care follow-up. She presented to clinic this morning with 3-4 days of worsening pain, purulent, foul smelling drainage. She reports completing course of antibiotics but is not currently on prednisone as prescribed. She reports nausea and vomiting this morning. She denies fever, chills, chest pain, shortness of breath. She reports continued cocaine abuse with last time using reported as 3 days ago.  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 Possible pyoderma gangrenosum as well.  As wound does not look infected, we will discontinue antibiotics.  We will discharge on ibuprofen for pain control given substance abuse history.  WOC recommendations are for continuing nonadherent dressings for now.  Blood cultures have been no growth, wound cultures with multiple organisms, none predominant (no staph aureus or strep pyogenes isolated).  Will continue Prednisone taper as detailed above.  PT evaluated patient and is recommending SNF placement.  CSW consulted to assist with placement.  Placement was found to be very difficult with no bed offers received locally.  Patient was given option of discharging to SNF out-of-town or to home and she opted to discharge home.  We felt that it was important for [name] to go to a facility where she could avoid the temptation of substances that have led to her multiple hospitalizations.  Unfortunately, this was not attainable.  We also attempted to arrange home health services for patient.  However, previous home health company would not accept patient due to non-compliance.  Other agencies unable to  take patient as she was unable to provide a definitive address at discharge.  Patient provided extra materials at discharge to help her care for her wound.     Discharge Vitals:   BP 154/65 mmHg  Pulse 59  Temp(Src) 98.1 F (36.7 C) (Oral)  Resp 18  Ht 5' 1"  (1.549 m)  Wt 42 kg (92 lb 9.5 oz)  BMI 17.50 kg/m2  SpO2 100%  Discharge Labs:  No results found for this or any previous visit (from the past 24 hour(s)).  Signed: Jule Ser, DO 10/29/2015, 2:26 PM    Services Ordered on Discharge: none Equipment Ordered on Discharge: rolling walker with 5" wheels

## 2015-10-28 NOTE — Progress Notes (Signed)
Pt. Nowhere to be found on unit. Security called. They said they will look for her.

## 2015-10-28 NOTE — Progress Notes (Signed)
Pt requested to speak with the CSW concerning d/c planning.   Pt stated that "if it's not gonna happen today: I need to go." Pt does not want to wait for placement and stated that she "can get some of it taking care of after I leave but I can't stay."   CSW counseled Pt concerning repeat hospital visits and reminded Pt of her desire to continue on to SNF so that her medical condition could be better taken care of and Pt could experience a full recovery. Pt agreed with CSW, however Pt continues to stand firm with her decision for d/c back to her brother's friends apartment with [facility] services.   CSW notified MD and also CM.  CSW will continue to follow along with Pt for d/c planning.   [facility] [phone]

## 2015-10-28 NOTE — Progress Notes (Signed)
CSW spoke with [facility]) asking for them to consider the Pt for admission.   [name] will go over paperwork and contact CSW back for possible bed offer.   CSW spoke with MD prior to this conversation and explained there may be a bed however that was not a feasible d/c facility.   CSW will continue to search for d/c plan to facility today if possible.  [facility] [phone]

## 2015-10-29 NOTE — Progress Notes (Signed)
CSW spoke with the medical director and a d/c plan was proposed for d/c to SNF today out of town with a [place].   Medical director noted that if the Pt declined the placement she was to discharge to home.  D/C plan option was given to Pt with both options.   Pt chose to go home. CSW notified Pt's nurse who in turn notified the MD.   CSW also informed the CM for possible [place] following.   CSW signing off.   [facility] [phone]

## 2015-10-29 NOTE — Care Management (Signed)
Patient did not want rolling walker. Magdalen Spatz RN BSN

## 2015-10-29 NOTE — Progress Notes (Signed)
Pt. Left hospital floor; security was called to try and find her, but they couldn't find her. Pt. Came back to floor around 10:15. MD made aware. Pt. Made aware that she cannot leave the floor without a doctor's order.

## 2015-10-29 NOTE — Progress Notes (Signed)
Subjective: Patient seen and examined this morning.  No acute events overnight.  Was able to ambulate yesterday.  Having some leg pain this morning but no other current complaints.  States she now wants to stay until SNF placement found or February 1 when she gets her check to pay for an apartment.  SNF placement has been unsuccessful thus far with no current bed offers locally.  CSW to discuss with patient today either discharge to out-of-town SNF or home today.    Objective: Vital signs in last 24 hours: Filed Vitals:   10/27/15 2131 10/28/15 0535 10/28/15 2151 10/29/15 0559  BP: 131/66 140/70 139/69 154/65  Pulse: 62 45 75 59  Temp: 97.4 F (36.3 C) 97.5 F (36.4 C) 98 F (36.7 C) 98.1 F (36.7 C)  TempSrc: Axillary Axillary Oral Oral  Resp: 16 16 16 18   Height:      Weight:      SpO2: 100% 100% 100% 100%   Weight change:   Intake/Output Summary (Last 24 hours) at 10/29/15 1025 Last data filed at 10/29/15 0559  Gross per 24 hour  Intake      0 ml  Output   1150 ml  Net  -1150 ml   General: frail appearing, pleasant, resting in bed, no distress HEENT: EOMI, no scleral icterus Pulm: normal work of breathing Ext: warm and well perfused, wound to right posterior right leg with non-adherent bandage covering with no drainage through the bandage.  Other chronic wounds present over much of her extremities. Neuro: alert and oriented X3, no focal deficits  Lab Results: Basic Metabolic Panel:  Recent Labs Lab 10/25/15 0514 10/26/15 0545  NA 140 139  K 4.6 4.7  CL 110 106  CO2 25 28  GLUCOSE 106* 89  BUN 18 17  CREATININE 1.02* 0.96  CALCIUM 8.5* 8.8*   Liver Function Tests:  Recent Labs Lab 10/23/15 1420  AST 16  ALT 11*  ALKPHOS 58  BILITOT 0.6  PROT 6.3*  ALBUMIN 3.1*   CBC:  Recent Labs Lab 10/23/15 1420 10/24/15 0453 10/25/15 0514 10/26/15 0545  WBC 14.9* 7.3 9.5 10.2  NEUTROABS 11.2* 4.7  --   --   HGB 13.2 10.8* 10.4* 10.5*  HCT 39.8 33.6*  33.1* 32.0*  MCV 86.1 85.9 88.5 86.7  PLT 370 257 215 240   Micro Results: Recent Results (from the past 240 hour(s))  Wound culture     Status: None   Collection Time: 10/23/15  4:27 PM  Result Value Ref Range Status   Specimen Description WOUND RIGHT LEG  Final   Special Requests NONE  Final   Gram Stain   Final    RARE WBC PRESENT, PREDOMINANTLY MONONUCLEAR NO SQUAMOUS EPITHELIAL CELLS SEEN MODERATE GRAM NEGATIVE RODS Performed at [facility]    Culture   Final    MULTIPLE ORGANISMS PRESENT, NONE PREDOMINANT Note: NO STAPHYLOCOCCUS AUREUS ISOLATED NO GROUP A STREP (S.PYOGENES) ISOLATED Performed at [facility]    Report Status 10/26/2015 FINAL  Final  Culture, blood (Routine X 2) w Reflex to ID Panel     Status: None   Collection Time: 10/23/15  4:50 PM  Result Value Ref Range Status   Specimen Description BLOOD RIGHT ANTECUBITAL  Final   Special Requests BOTTLES DRAWN AEROBIC AND ANAEROBIC 5CC  Final   Culture NO GROWTH 5 DAYS  Final   Report Status 10/28/2015 FINAL  Final  Culture, blood (Routine X 2) w Reflex to ID  Panel     Status: None   Collection Time: 10/23/15  5:00 PM  Result Value Ref Range Status   Specimen Description BLOOD LEFT HAND  Final   Special Requests BOTTLES DRAWN AEROBIC AND ANAEROBIC 10CC  Final   Culture NO GROWTH 5 DAYS  Final   Report Status 10/28/2015 FINAL  Final  MRSA PCR Screening     Status: None   Collection Time: 10/27/15  5:01 AM  Result Value Ref Range Status   MRSA by PCR NEGATIVE NEGATIVE Final    Comment:        The GeneXpert MRSA Assay (FDA approved for NASAL specimens only), is one component of a comprehensive MRSA colonization surveillance program. It is not intended to diagnose MRSA infection nor to guide or monitor treatment for MRSA infections.    Medications:  Scheduled Meds: . cefTRIAXone (ROCEPHIN) 1 g IVPB  1 g Intravenous Q24H  . doxycycline  100 mg Oral Q12H  . enoxaparin (LOVENOX)  injection  20 mg Subcutaneous Q24H  . feeding supplement  1 Container Oral TID BM  . feeding supplement (PRO-STAT SUGAR FREE 64)  30 mL Oral Daily  . multivitamin with minerals  1 tablet Oral Daily  . potassium chloride  40 mEq Oral BID  . 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 Possible pyoderma gangrenosum as well  - No longer on antibiotics - Oxycodone 61m as needed for pain - WOC consult appreciated - Wound cx Gram stain with moderate GNR, culture with multiple organisms present, none predominant.  No Staph or Strep isolated - Blood cx with NGTD final report - Continue Prednisone 473mdaily, then begin to slowly taper - PT recommending SNF - CSW working on SNF placement, no bed offers at present locally.  Patient adamant about discharge yesterday to home if was not going to be placed that day.  Decided she wanted to stay.  Now she is open again to SNF placement or staying until Feb 1 when she has plans to move to an apartment.  Cocaine Abuse - Social work consulted  FEN Fluids: none Electrolytes: monitor and replace as needed Nutrition: Regular diet  DVT PPx: Lovenox  CODE: Full  Dispo: either home today or out of town SNF  The patient does have a current PCP (ViZada FindersMD) and [facility].  The patient does have transportation limitations that hinder transportation to clinic appointments.  .Services Needed at time of discharge: Y = Yes, Blank = No PT:  yes  OT:   RN:   Equipment:   Other:     LOS: 6 days   AnJule SerDO 10/29/2015, 10:25 AM

## 2015-10-29 NOTE — Progress Notes (Signed)
Discharge papers gone over with patient. Patient educated on wound care, plan of care for home, follow-up appointments, and medications and how to get medications. . Pt. Agrees with plan and signed discharge papers. Pt. States she needs a bus pass. Social worker contacted. Waiting on phone call back from social worker about bus pass.

## 2015-11-01 ENCOUNTER — Telehealth: Payer: Self-pay | Admitting: Internal Medicine

## 2015-11-01 NOTE — Telephone Encounter (Signed)
NO ANSWER

## 2015-11-04 ENCOUNTER — Ambulatory Visit: Payer: Self-pay | Admitting: Internal Medicine

## 2015-11-14 ENCOUNTER — Emergency Department (HOSPITAL_COMMUNITY): Payer: Medicaid Other

## 2015-11-14 ENCOUNTER — Observation Stay (HOSPITAL_COMMUNITY)
Admission: EM | Admit: 2015-11-14 | Discharge: 2015-11-15 | Disposition: A | Discharge status: Nursing Home (Includes ICF) | Payer: Medicaid Other | Attending: Internal Medicine | Admitting: Internal Medicine

## 2015-11-14 ENCOUNTER — Encounter (HOSPITAL_COMMUNITY): Payer: Self-pay | Admitting: Emergency Medicine

## 2015-11-14 DIAGNOSIS — E872 Acidosis, unspecified: Secondary | ICD-10-CM | POA: Diagnosis present

## 2015-11-14 DIAGNOSIS — F1410 Cocaine abuse, uncomplicated: Secondary | ICD-10-CM | POA: Diagnosis not present

## 2015-11-14 DIAGNOSIS — I776 Arteritis, unspecified: Secondary | ICD-10-CM | POA: Insufficient documentation

## 2015-11-14 DIAGNOSIS — L97219 Non-pressure chronic ulcer of right calf with unspecified severity: Secondary | ICD-10-CM | POA: Diagnosis not present

## 2015-11-14 DIAGNOSIS — R0602 Shortness of breath: Secondary | ICD-10-CM | POA: Insufficient documentation

## 2015-11-14 DIAGNOSIS — Z681 Body mass index (BMI) 19 or less, adult: Secondary | ICD-10-CM | POA: Diagnosis not present

## 2015-11-14 DIAGNOSIS — E43 Unspecified severe protein-calorie malnutrition: Secondary | ICD-10-CM | POA: Insufficient documentation

## 2015-11-14 DIAGNOSIS — F14988 Cocaine use, unspecified with other cocaine-induced disorder: Secondary | ICD-10-CM | POA: Diagnosis present

## 2015-11-14 DIAGNOSIS — R079 Chest pain, unspecified: Principal | ICD-10-CM | POA: Insufficient documentation

## 2015-11-14 DIAGNOSIS — F17210 Nicotine dependence, cigarettes, uncomplicated: Secondary | ICD-10-CM | POA: Diagnosis not present

## 2015-11-14 DIAGNOSIS — I10 Essential (primary) hypertension: Secondary | ICD-10-CM | POA: Diagnosis not present

## 2015-11-14 DIAGNOSIS — M069 Rheumatoid arthritis, unspecified: Secondary | ICD-10-CM | POA: Insufficient documentation

## 2015-11-14 DIAGNOSIS — E876 Hypokalemia: Secondary | ICD-10-CM | POA: Diagnosis not present

## 2015-11-14 DIAGNOSIS — I999 Unspecified disorder of circulatory system: Secondary | ICD-10-CM

## 2015-11-14 LAB — CBC
HCT: 37 % (ref 36.0–46.0)
HEMOGLOBIN: 12.2 g/dL (ref 12.0–15.0)
MCH: 28 pg (ref 26.0–34.0)
MCHC: 33 g/dL (ref 30.0–36.0)
MCV: 84.9 fL (ref 78.0–100.0)
Platelets: 272 10*3/uL (ref 150–400)
RBC: 4.36 MIL/uL (ref 3.87–5.11)
RDW: 14.6 % (ref 11.5–15.5)
WBC: 5.1 10*3/uL (ref 4.0–10.5)

## 2015-11-14 LAB — I-STAT CG4 LACTIC ACID, ED
LACTIC ACID, VENOUS: 2.78 mmol/L — AB (ref 0.5–2.0)
LACTIC ACID, VENOUS: 0.95 mmol/L (ref 0.5–2.0)

## 2015-11-14 LAB — BASIC METABOLIC PANEL
ANION GAP: 14 (ref 5–15)
BUN: 15 mg/dL (ref 6–20)
CHLORIDE: 106 mmol/L (ref 101–111)
CO2: 24 mmol/L (ref 22–32)
CREATININE: 0.94 mg/dL (ref 0.44–1.00)
Calcium: 9.9 mg/dL (ref 8.9–10.3)
GFR calc non Af Amer: >60 mL/min (ref >60)
GLUCOSE: 84 mg/dL (ref 65–99)
Potassium: 3 mmol/L — ABNORMAL LOW (ref 3.5–5.1)
Sodium: 144 mmol/L (ref 135–145)

## 2015-11-14 LAB — I-STAT TROPONIN, ED: Troponin i, poc: 0 ng/mL (ref 0.00–0.08)

## 2015-11-14 MED ORDER — SODIUM CHLORIDE 0.9% FLUSH
3.0000 mL | Freq: Two times a day (BID) | INTRAVENOUS | Status: DC
Start: 2015-11-14 — End: 2015-11-15
  Administered 2015-11-15: 3 mL via INTRAVENOUS

## 2015-11-14 MED ORDER — PROMETHAZINE HCL 25 MG/ML IJ SOLN
12.5000 mg | Freq: Four times a day (QID) | INTRAMUSCULAR | Status: DC | PRN
  Filled 2015-11-14: qty 1

## 2015-11-14 MED ORDER — ENOXAPARIN SODIUM 30 MG/0.3ML ~~LOC~~ SOLN
20.0000 mg | SUBCUTANEOUS | Status: DC
  Administered 2015-11-15: 20 mg via SUBCUTANEOUS
  Filled 2015-11-14: qty 0.2
  Filled 2015-11-14: qty 0.3

## 2015-11-14 MED ORDER — POTASSIUM CHLORIDE CRYS ER 20 MEQ PO TBCR: 20.0000 meq | EXTENDED_RELEASE_TABLET | Freq: Two times a day (BID) | ORAL | Status: DC

## 2015-11-14 MED ORDER — SENNOSIDES-DOCUSATE SODIUM 8.6-50 MG PO TABS
1.0000 | ORAL_TABLET | Freq: Every evening | ORAL | Status: DC | PRN
  Filled 2015-11-14: qty 1

## 2015-11-14 MED ORDER — POTASSIUM CHLORIDE CRYS ER 20 MEQ PO TBCR
40.0000 meq | EXTENDED_RELEASE_TABLET | Freq: Once | ORAL | Status: AC
  Administered 2015-11-14: 40 meq via ORAL
  Filled 2015-11-14: qty 2

## 2015-11-14 MED ORDER — SODIUM CHLORIDE 0.9 % IV SOLN
INTRAVENOUS | Status: DC
  Administered 2015-11-15: 100 mL/h via INTRAVENOUS

## 2015-11-14 MED ORDER — BOOST / RESOURCE BREEZE PO LIQD
1.0000 | Freq: Three times a day (TID) | ORAL | Status: DC
  Filled 2015-11-14: qty 1

## 2015-11-14 MED ORDER — LORAZEPAM 1 MG PO TABS
1.0000 mg | ORAL_TABLET | Freq: Once | ORAL | Status: AC
  Administered 2015-11-14: 1 mg via ORAL
  Filled 2015-11-14: qty 1

## 2015-11-14 MED ORDER — ADULT MULTIVITAMIN W/MINERALS CH
1.0000 | ORAL_TABLET | Freq: Every day | ORAL | Status: DC
  Administered 2015-11-15: 1 via ORAL
  Filled 2015-11-14: qty 1

## 2015-11-14 MED ORDER — SODIUM CHLORIDE 0.9 % IV BOLUS (SEPSIS)
1000.0000 mL | Freq: Once | INTRAVENOUS | Status: AC
  Administered 2015-11-14: 1000 mL via INTRAVENOUS

## 2015-11-14 NOTE — ED Provider Notes (Signed)
CSN: [id number]     Arrival date & time 11/14/15  2014 History   First MD Initiated Contact with Patient 11/14/15 2022     Chief Complaint  Patient presents with  . Chest Pain    The patient was at a landromat and started having chest pain.  EMS gave her 353m by mouth and it helped ease the pain.  She started at 5/10 then went to 3/10.  She is also complaining of leg pain.      (Consider location/radiation/quality/duration/timing/severity/associated sxs/prior Treatment) HPI Comments: Patient reports central chest pain onset around 6 PM while she was sitting on her resting. She called EMS. They gave her aspirin which helped somewhat. She admits to using cocaine 2 days ago. States she has had similar pain in the past but never had a heart attack. She endorses shortness of breath. No nausea no vomiting or diaphoresis. 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  Patient is a 55y.o. female presenting with chest pain. The history is provided by the patient and the EMS personnel.  Chest Pain Associated symptoms: shortness of breath   Associated symptoms: no abdominal pain, no cough, no dizziness, no headache, no nausea, not vomiting and no weakness     Past Medical History  Diagnosis Date  . 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 Followed by Dr. NLouanne Skye . Hypertension   . Cocaine abuse     ongoing with resultant vaculitis.  . Depression   . Normocytic anemia     BL Hgb 9.8-12. Last anemia panel 04/2010 - showing Fe 19, ferritin 101.  Pt on monthly B12 injections  . 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 CAP (community acquired pneumonia) 03/2014 X 2  . Daily headache   . Migraines     "probably 5-6/yr" (04/21/2014)  . Rheumatoid arthritis(714.0)     patient reported  . Inflammatory arthritis ([facility]    Past Surgical History  Procedure Laterality Date  . Skin biopsy Bilateral 04/2010     shin nodules  . Irrigation and debridement abscess Bilateral 09/26/2013    Procedure: DEBRIDEMENT ULCERS BILATERAL THIGHS;  Surgeon: JGwenyth Ober MD;  [facility]  Service: General;  Laterality: Bilateral;  . Hernia repair      "stomach"  . I&d extremity Right 09/26/2015    Procedure: IRRIGATION AND DEBRIDEMENT LEG WOUND  VAC PLACEMENT.;  Surgeon: CLoel LoftyDillingham, DO;  [place] [facility]  Service: Plastics;  Laterality: Right;   Family History  Problem Relation Age of Onset  . Breast cancer Mother     Breast cancer  . Alcohol abuse Mother   . Colon cancer Maternal Aunt 542 . Alcohol abuse Father    Social History  Substance Use Topics  . Smoking status: Current Every Day Smoker -- 0.30 packs/day for 34 years    Types: Cigarettes  . Smokeless tobacco: Never Used     Comment: "Trying very hard". / 4-5 CIGARETTES A DAY  . Alcohol Use: No   OB History    No data available     Review of Systems  Constitutional: Negative for activity change and appetite change.  HENT: Negative for congestion.   Eyes: Negative for visual disturbance.  Respiratory: Positive for chest tightness and shortness of breath. Negative for cough.   Cardiovascular: Positive for chest pain.  Gastrointestinal: Negative for  nausea, vomiting and abdominal pain.  Genitourinary: Negative for dysuria, hematuria and vaginal discharge.  Musculoskeletal: Positive for myalgias and arthralgias.  Skin: Negative for rash.  Neurological: Negative for dizziness, weakness, light-headedness and headaches.  A complete 10 system review of systems was obtained and all systems are negative except as noted in the HPI and PMH.      Allergies  Acetaminophen  Home Medications   Prior to Admission medications   Medication Sig Start Date End Date Taking? Authorizing Provider  Amino Acids-Protein Hydrolys (FEEDING SUPPLEMENT, PRO-STAT SUGAR FREE 64,) LIQD Take 30 mLs by mouth daily. 10/28/15  Yes Jule Ser, DO    feeding supplement (BOOST / RESOURCE BREEZE) LIQD Take 1 Container by mouth 3 (three) times daily between meals. 10/28/15  Yes Jule Ser, DO  Multiple Vitamin (MULTIVITAMIN WITH MINERALS) TABS tablet Take 1 tablet by mouth daily. 10/28/15  Yes Jule Ser, DO  potassium chloride SA (K-DUR,KLOR-CON) 20 MEQ tablet Take 1 tablet (20 mEq total) by mouth 2 (two) times daily. 10/28/15  Yes Jule Ser, DO   BP 141/90 mmHg  Pulse 86  Temp(Src) 98.4 F (36.9 C) (Rectal)  Resp 18  SpO2 99% Physical Exam  Constitutional: She is oriented to person, place, and time. She appears well-developed and well-nourished. No distress.  Cachectic, chronically ill appearing  HENT:  Head: Normocephalic and atraumatic.  Mouth/Throat: Oropharynx is clear and moist. No oropharyngeal exudate.  Eyes: Conjunctivae and EOM are normal. Pupils are equal, round, and reactive to light.  Neck: Normal range of motion. Neck supple.  No meningismus.  Cardiovascular: Normal rate, regular rhythm, normal heart sounds and intact distal pulses.   No murmur heard. Pulmonary/Chest: Effort normal and breath sounds normal. No respiratory distress. She exhibits tenderness.  Central chest tenderness, worse with palpation  Abdominal: Soft. There is no tenderness. There is no rebound and no guarding.  Musculoskeletal: Normal range of motion. She exhibits tenderness. She exhibits no edema.  Right leg with chronic ulceration to the posterior calf Base appears to be clean. There is no spreading cellulitis. Intact DP and PT pulse  Neurological: She is alert and oriented to person, place, and time. No cranial nerve deficit. She exhibits normal muscle tone. Coordination normal.  No ataxia on finger to nose bilaterally. No pronator drift. 5/5 strength throughout. CN 2-12 intact.Equal grip strength. Sensation intact.   Skin: Skin is warm.  Psychiatric: She has a normal mood and affect. Her behavior is normal.  Nursing note and vitals  reviewed.     ED Course  Procedures (including critical care time) Labs Review Labs Reviewed  BASIC METABOLIC PANEL - Abnormal; Notable for the following:    Potassium 3.0 (*)    All other components within normal limits  I-STAT CG4 LACTIC ACID, ED - Abnormal; Notable for the following:    Lactic Acid, Venous 2.78 (*)    All other components within normal limits  CULTURE, BLOOD (ROUTINE X 2)  CULTURE, BLOOD (ROUTINE X 2)  CBC  URINE RAPID DRUG SCREEN, HOSP PERFORMED  MAGNESIUM  PHOSPHORUS  COMPREHENSIVE METABOLIC PANEL  TROPONIN I  TROPONIN I  I-STAT TROPOININ, ED  I-STAT CG4 LACTIC ACID, ED    Imaging Review Dg Chest 2 View  11/14/2015  CLINICAL DATA:  Chest pain with cough. EXAM: CHEST  2 VIEW COMPARISON:  09/25/2015. FINDINGS: The heart size and mediastinal contours are within normal limits. Mild scarring in the upper lung zones. BILATERAL nipple shadows. No effusion or pneumothorax. No acute osseous  findings. Thoracic atherosclerosis. IMPRESSION: No active cardiopulmonary disease. Electronically Signed   By: Staci Righter M.D.   On: 11/14/2015 21:13   I have personally reviewed and evaluated these images and lab results as part of my medical decision-making.   EKG Interpretation   Date/Time:  Thursday November 14 2015 20:23:45 EST Ventricular Rate:  83 PR Interval:  138 QRS Duration: 86 QT Interval:  416 QTC Calculation: 489 R Axis:   74 Text Interpretation:  Sinus rhythm Biatrial enlargement Probable left  ventricular hypertrophy Borderline prolonged QT interval No significant  change was found Confirmed by Wyvonnia Dusky  MD, Ronnald Shedden [phone]) on 11/14/2015  8:50:37 PM      MDM   Final diagnoses:  Cocaine abuse  Chest pain, unspecified chest pain type   Central chest pain onset around 6 PM. Reproducible on exam. Admits to cocaine use. EKG is unchanged.  Right leg wound appears much improved compared to prior photographs. No obvious infection.  X-ray negative.  Troponin negative 1. Suspect musculoskeletal chest pain but cannot rule out cocaine induced vasospasm.  Right leg wound appears to be much improved. Will hold antibiotics at this time. Mild lactic acid elevation without fever or leukocytosis. We'll gently hydrate.  Observe overnight for serial troponins and monitoring. Discussed with medicine residents    Ezequiel Essex, MD 11/15/15 [phone]

## 2015-11-14 NOTE — H&P (Signed)
Date: 11/14/2015               Patient Name:  [name] MRN: [id number]  DOB: [birth year] Age / Sex: 55 y.o., female   PCP: Zada Finders, MD         Medical Service: Internal Medicine Teaching Service         Attending Physician: Dr. Thayer Headings, MD    First Contact: Dr. Juleen China Pager: [phone]  Second Contact: Dr. 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 She was at a laundromat and feeling at baseline prior to this. She reports last using cocaine 2 days ago. Chest pain was accompanied by shortness of breath. She denies any diaphoresis, nausea, dizziness, or radiation of her pain. EMS arrived and gave 314m ASA which she feels improved her pain from moderate to mild but persistent. At the ED EKG was obtained showing no acute changes and initial troponins negative. Mild hypokalemia to 3.0 and lactic acidosis of 2.3 otherwise studies negative.    She was last hospitalized 1/18 for recurrent leg ulceration due to levimasole induced vasculitis. She was discharged on a prednisone taper for this inflammation that she completed within the past week. She has had previous hospitalization for chest pain in 04/2015 also attributed to cocaine use and found to have anterior lead EKG abnormalities but negative troponins and no major ischemic event.   Meds: Current Facility-Administered Medications  Medication Dose Route Frequency Provider Last Rate Last Dose  . 0.9 %  sodium chloride infusion   Intravenous Continuous Marjan Rabbani, MD      . [Derrill MemoON 11/15/2015] enoxaparin (LOVENOX) injection 20 mg  20 mg Subcutaneous Q24H Marjan Rabbani, MD      . [Derrill MemoON 11/15/2015] feeding supplement  (BOOST / RESOURCE BREEZE) liquid 1 Container  1 Container Oral TID BM MJuluis Mire MD      . [Derrill MemoON 11/15/2015] multivitamin with minerals tablet 1 tablet  1 tablet Oral Daily Marjan Rabbani, MD      . [Derrill MemoON 11/15/2015] potassium chloride SA (K-DUR,KLOR-CON) CR tablet 20 mEq  20 mEq Oral BID Marjan Rabbani, MD      . promethazine (PHENERGAN) injection 12.5 mg  12.5 mg Intravenous Q6H PRN Marjan Rabbani, MD      . senna-docusate (Senokot-S) tablet 1 tablet  1 tablet Oral QHS PRN Marjan Rabbani, MD      . sodium chloride 0.9 % bolus 1,000 mL  1,000 mL Intravenous Once SEzequiel Essex MD 500 mL/hr at 11/14/15 2258 1,000 mL at 11/14/15 2258  . sodium chloride flush (NS) 0.9 % injection 3 mL  3 mL Intravenous Q12H MJuluis Mire MD       Current Outpatient Prescriptions  Medication Sig Dispense Refill  . Amino Acids-Protein Hydrolys (FEEDING SUPPLEMENT, PRO-STAT SUGAR FREE 64,) LIQD Take 30 mLs by mouth daily. 900 mL 0  . feeding supplement (BOOST / RESOURCE BREEZE) LIQD Take 1 Container by mouth 3 (three) times daily between meals. 90 Container 3  . Multiple Vitamin (MULTIVITAMIN WITH MINERALS) TABS tablet Take 1 tablet by mouth daily. 30 tablet 0  . potassium chloride SA (K-DUR,KLOR-CON) 20 MEQ tablet Take 1  tablet (20 mEq total) by mouth 2 (two) times daily. 30 tablet 2  . predniSONE (DELTASONE) 10 MG tablet 32m x 2 days, then 373mx 7 days, then 2031m 7 days, then 31m87m days, then off 51 tablet 0    Allergies: Allergies as of 11/14/2015 - Review Complete 11/14/2015  Allergen Reaction Noted  . Acetaminophen Swelling    Past Medical History  Diagnosis Date  . Vasculitis (H[facility]  2/2 Levimasole toxicity. Followed by Dr. NitkLouanne SkyeHypertension   . Cocaine abuse     ongoing with resultant vaculitis.  . Depression   . Normocytic anemia     BL Hgb 9.8-12. Last anemia panel 04/2010 - showing Fe 19, ferritin 101.  Pt on monthly B12 injections  . 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 CAP (community acquired pneumonia) 03/2014 X 2  . Daily headache   . Migraines     "probably 5-6/yr" (04/21/2014)  . Rheumatoid arthritis(714.0)     patient reported  . [facility] Past [facility] History  Procedure Laterality Date  . Skin biopsy Bilateral 04/2010    shin nodules  . Irrigation and debridement abscess Bilateral 09/26/2013    Procedure: DEBRIDEMENT ULCERS BILATERAL THIGHS;  Surgeon: JameGwenyth Ober;  [facility];  Laterality: Bilateral;  . Hernia repair      "stomach"  . I&d extremity Right 09/26/2015    Procedure: IRRIGATION AND DEBRIDEMENT LEG WOUND  VAC PLACEMENT.;  Surgeon: ClaiLoel Loftylingham, DO;  [facility] Pl[facility];  Laterality: Right;   Family History  Problem Relation Age of Onset  . Breast cancer Mother     Breast cancer  . Alcohol abuse Mother   . Colon cancer Maternal Aunt 50  46Alcohol abuse Father    Social History   Social History  . Marital Status: Single    Spouse Name: N/A  . Number of Children: 0  . Years of Education: 11th grade   Occupational History  . Disability     since 2011, due to her rheumatoid arthritis   Social History Main Topics  . Smoking status: Current Every Day Smoker -- 0.30 packs/day for 34 years    Types: Cigarettes  . Smokeless tobacco: Never Used     Comment: "Trying very hard". / 4-5 CIGARETTES A DAY  . Alcohol Use: No  . Drug Use: Yes    Special: "Crack" cocaine, Cocaine     Comment: States she still uses crack "a little bit"., last used 09/23/15  . Sexual Activity: No   Other Topics Concern  . Not on file   Social History Narrative   Unemployed:  cleaning in past   [place]   crack/cocaine use; pt denies IVDU   tobacco:  1/2 ppd, trying to quit   alcohol:  none        Review of Systems: Review of Systems  Constitutional: Negative for fever and chills.  Eyes:  Positive for blurred vision.  Respiratory: Positive for shortness of breath. Negative for cough.   Cardiovascular: Positive for chest pain.  Gastrointestinal: Negative for nausea.  Genitourinary: Negative for dysuria.  Musculoskeletal: Negative for falls.  Skin: Negative for rash.  Neurological: Negative for dizziness and headaches.  Endo/Heme/Allergies: Negative for polydipsia.  Psychiatric/Behavioral: Positive for substance abuse.    Physical Exam: Blood pressure  141/90, pulse 86, resp. rate 18, SpO2 99 %. GENERAL- alert, co-operative, NAD HEENT- Atraumatic, PERRL, EOMI, oral mucosa appears moist, poor dentition, no cervical LN enlargement. CARDIAC- RRR, no murmurs, rubs or gallops, reproducible chest pain on palpation over the sternum RESP- CTAB, no wheezes or crackles. ABDOMEN- Soft, mildly TTP, no guarding or rebound, normoactive bowel sounds present NEURO- No obvious Cr N abnormality, strength upper and lower extremities- 5/5, Sensation intact globally EXTREMITIES- Extensive areas of chronic ulcerations throughout legs and lesser extent arms, RLE in ACE wrap, photograph of wound reviewed in EMR SKIN- Warm, dry, extensive lesions as above PSYCH- Labile affect, tearful discussing cocaine use, otherwise cheerful   Lab results: Basic Metabolic Panel:  Recent Labs  11/14/15 2103  NA 144  K 3.0*  CL 106  CO2 24  GLUCOSE 84  BUN 15  CREATININE 0.94  CALCIUM 9.9   Liver Function Tests: No results for input(s): AST, ALT, ALKPHOS, BILITOT, PROT, ALBUMIN in the last 72 hours. No results for input(s): LIPASE, AMYLASE in the last 72 hours. No results for input(s): AMMONIA in the last 72 hours. CBC:  Recent Labs  11/14/15 2103  WBC 5.1  HGB 12.2  HCT 37.0  MCV 84.9  PLT 272   Cardiac Enzymes: No results for input(s): CKTOTAL, CKMB, CKMBINDEX, TROPONINI in the last 72 hours. BNP: No results for input(s): PROBNP in the last 72 hours. D-Dimer: No results for input(s):  DDIMER in the last 72 hours. CBG: No results for input(s): GLUCAP in the last 72 hours. Hemoglobin A1C: No results for input(s): HGBA1C in the last 72 hours. Fasting Lipid Panel: No results for input(s): CHOL, HDL, LDLCALC, TRIG, CHOLHDL, LDLDIRECT in the last 72 hours. Thyroid Function Tests: No results for input(s): TSH, T4TOTAL, FREET4, T3FREE, THYROIDAB in the last 72 hours. Anemia Panel: No results for input(s): VITAMINB12, FOLATE, FERRITIN, TIBC, IRON, RETICCTPCT in the last 72 hours. Coagulation: No results for input(s): LABPROT, INR in the last 72 hours. Urine Drug Screen: Drugs of Abuse     Component Value Date/Time   LABOPIA NONE DETECTED 09/25/2015 0428   LABOPIA PPS 08/31/2012 1045   COCAINSCRNUR POSITIVE* 09/25/2015 0428   COCAINSCRNUR NEG 08/31/2012 1045   LABBENZ NONE DETECTED 09/25/2015 0428   LABBENZ NEG 08/31/2012 1045   LABBENZ NEGATIVE 06/12/2011 0221   AMPHETMU NONE DETECTED 09/25/2015 0428   AMPHETMU NEG 08/31/2012 1045   AMPHETMU NEGATIVE 06/12/2011 0221   THCU NONE DETECTED 09/25/2015 0428   THCU NEG 08/31/2012 1045   THCU 63.4* 05/20/2012 1405   LABBARB NONE DETECTED 09/25/2015 0428   LABBARB NEG 08/31/2012 1045    Alcohol Level: No results for input(s): ETH in the last 72 hours. Urinalysis: No results for input(s): COLORURINE, LABSPEC, PHURINE, GLUCOSEU, HGBUR, BILIRUBINUR, KETONESUR, PROTEINUR, UROBILINOGEN, NITRITE, LEUKOCYTESUR in the last 72 hours.  Invalid input(s): APPERANCEUR   Imaging results:  Dg Chest 2 View  11/14/2015  CLINICAL DATA:  Chest pain with cough. EXAM: CHEST  2 VIEW COMPARISON:  09/25/2015. FINDINGS: The heart size and mediastinal contours are within normal limits. Mild scarring in the upper lung zones. BILATERAL nipple shadows. No effusion or pneumothorax. No acute osseous findings. Thoracic atherosclerosis. IMPRESSION: No active cardiopulmonary disease. Electronically Signed   By: Staci Righter M.D.   On: 11/14/2015 21:13     Other results: EKG: Normal sinus rhythm, LVH, questionable flattened ST 35m above baseline in V2  Assessment & Plan by Problem: Chest pain Likely 2/2 cocaine use. Previous episode last year.  Now with no acute EKG changes, negative initial trops, slight LA elevation at 2.78 improving down to 0.95 with IV fluids. -Admit to telemetry for o/n observation -Repeat AM EKG -Finish trend trops x3 -Check LA after some fluid resuscitation  Cocaine-induced vascular disorder Longstanding, her primary healthcare problem and underlying mortality risk/cause of other complications. Attributed to levimasole induced vasculitis after extensive negative workup in the past. Was discharged on prednisone taper for recent severe episode last month. Patient was in discussion with staff and CSW last month about going to facility for substance abuse rehab and care of her RLE ulcerated wound but this was protracted due to insurance and placement issues and she left the hospital prior to this coming to fruition. -Repeat encouragment to limit cocaine use -Needs close outpt F/U after discharge to assess compliance -CSW referral -Wound care referral  Hypokalemia 3.0 on presentation. She is supposed to be on 20 mEq BID at home due to chronic hypokalemia 2/2 malnutrition.  -K-Cl 40 mEq BID -check Mag, AM Bmet  Protin-calorie malnutrition, severe, chronic On nutritional supplements, vitamins, electrolyte replacement PTA. -Regular diet -Nutritional supplement, vitamin  Diet: Regular VTE ppx: [place] enoxaparin FULL CODE  Dispo: Disposition is deferred at this time, awaiting improvement of current medical problems. Anticipated discharge in approximately 1 day(s).   The patient does have a current PCP (Zada Finders, MD) and does need an [facility] hospital follow-up appointment after discharge.  The patient does not know have transportation limitations that hinder transportation to clinic  appointments.  Signed: Collier Salina, MD 11/14/2015, 11:44 PM

## 2015-11-14 NOTE — ED Notes (Signed)
The patient was at a landromat and started having chest pain.  EMS gave her 350m by mouth and it helped ease the pain.  She started at 5/10 then went to 3/10.  She is also complaining of leg pain.

## 2015-11-15 DIAGNOSIS — R079 Chest pain, unspecified: Principal | ICD-10-CM

## 2015-11-15 DIAGNOSIS — F14288 Cocaine dependence with other cocaine-induced disorder: Secondary | ICD-10-CM

## 2015-11-15 DIAGNOSIS — E43 Unspecified severe protein-calorie malnutrition: Secondary | ICD-10-CM | POA: Diagnosis not present

## 2015-11-15 DIAGNOSIS — R0602 Shortness of breath: Secondary | ICD-10-CM | POA: Diagnosis not present

## 2015-11-15 DIAGNOSIS — I10 Essential (primary) hypertension: Secondary | ICD-10-CM | POA: Diagnosis not present

## 2015-11-15 DIAGNOSIS — E876 Hypokalemia: Secondary | ICD-10-CM | POA: Diagnosis not present

## 2015-11-15 DIAGNOSIS — F1410 Cocaine abuse, uncomplicated: Secondary | ICD-10-CM | POA: Diagnosis not present

## 2015-11-15 LAB — COMPREHENSIVE METABOLIC PANEL
ALT: 9 U/L — AB (ref 14–54)
AST: 15 U/L (ref 15–41)
Albumin: 2.7 g/dL — ABNORMAL LOW (ref 3.5–5.0)
Alkaline Phosphatase: 51 U/L (ref 38–126)
Anion gap: 13 (ref 5–15)
BUN: 14 mg/dL (ref 6–20)
CHLORIDE: 117 mmol/L — AB (ref 101–111)
CO2: 18 mmol/L — AB (ref 22–32)
CREATININE: 0.98 mg/dL (ref 0.44–1.00)
Calcium: 8.7 mg/dL — ABNORMAL LOW (ref 8.9–10.3)
GFR calc Af Amer: >60 mL/min (ref >60)
Glucose, Bld: 133 mg/dL — ABNORMAL HIGH (ref 65–99)
Potassium: 3.9 mmol/L (ref 3.5–5.1)
SODIUM: 148 mmol/L — AB (ref 135–145)
Total Bilirubin: 0.3 mg/dL (ref 0.3–1.2)
Total Protein: 5.2 g/dL — ABNORMAL LOW (ref 6.5–8.1)

## 2015-11-15 LAB — TROPONIN I

## 2015-11-15 LAB — RAPID URINE DRUG SCREEN, HOSP PERFORMED
Amphetamines: NOT DETECTED
Barbiturates: NOT DETECTED
Benzodiazepines: NOT DETECTED
COCAINE: POSITIVE — AB
OPIATES: NOT DETECTED
TETRAHYDROCANNABINOL: NOT DETECTED

## 2015-11-15 LAB — MAGNESIUM: Magnesium: 1.9 mg/dL (ref 1.7–2.4)

## 2015-11-15 LAB — PHOSPHORUS: PHOSPHORUS: 3.8 mg/dL (ref 2.5–4.6)

## 2015-11-15 MED ORDER — POTASSIUM CHLORIDE CRYS ER 20 MEQ PO TBCR
20.0000 meq | EXTENDED_RELEASE_TABLET | Freq: Two times a day (BID) | ORAL | Status: DC
  Administered 2015-11-15: 20 meq via ORAL
  Filled 2015-11-15: qty 1

## 2015-11-15 MED ORDER — DICLOFENAC SODIUM 1 % TD GEL
2.0000 g | Freq: Four times a day (QID) | TRANSDERMAL | Status: DC
  Administered 2015-11-15: 2 g via TOPICAL
  Filled 2015-11-15: qty 100

## 2015-11-15 MED ORDER — IBUPROFEN 800 MG PO TABS
800.0000 mg | ORAL_TABLET | Freq: Four times a day (QID) | ORAL | Status: DC | PRN
  Administered 2015-11-15: 800 mg via ORAL
  Filled 2015-11-15: qty 1

## 2015-11-15 NOTE — Consult Note (Signed)
WOC wound consult note This patient is well known to our department from her previous admissions.  She presents with levamisole vasculitis with a history of multiple wounds of face, extremities and buttocks.  When last seen by my partner on 10/23/15, the wounds looked better than they have in previous admissions.  Today, the left lateral LE wound has healed and we are consulted on the chronic right posterior calf wound. Reason for Consult:LE ulcers Wound type:Levamisole vasculitis Pressure Ulcer POA: No Measurement:8.5cm x 5.5cm x 0.2cm Wound bed:red, moist, free of necrotic tissue, albeit non-granulating Drainage (amount, consistency, odor) minimal serosanguinous with no odor Periwound:intact with evidence of previous wound healing (scars) Dressing procedure/placement/frequency: Patient has low tolerance for any wound contact layer suggested over pain non-adherent gauze, including silicone, petrolatum and oil emulsion.  Today she requested to remove the old dressing herself rather than have someone else perform that task. She is sensitive to saline cleanse and will tolerate only a non-adherent gauze pad as a cover dressing.  As the wound is moving in a positive trajectory at this time, that POC is mutually acceptable.  Orders are provided for Nursing and supplies provided for dressing application by bedside RN after patient finishes breakfast. WOC nursing team will not follow, but will remain available to this patient, the nursing and medical teams.  Please re-consult if needed. Thanks, Maudie Flakes, MSN, RN, [place], Arther Abbott  Pager# [phone]

## 2015-11-15 NOTE — Discharge Instructions (Signed)
Please continue to take care of your wound by doing the following:Cleanse with NS, pat gently dry. Cover with non-adherent gauze pad, secure with stretch roll gauze/tape.  It is important that you go to your follow up appointment so you can get more supplies if needed and possibly we can arrange for home health services.  Please follow up with wound care center as well.

## 2015-11-15 NOTE — Discharge Summary (Signed)
Name: [name] MRN: [id number] DOB: Nov 24, 1960 55 y.o. PCP: Zada Finders, MD  Date of Admission: 11/14/2015  8:14 PM Date of Discharge: 11/15/2015 Attending Physician: Thayer Headings, MD  Discharge Diagnosis:  Principal Problem:   Chest pain Active Problems:   Cocaine-induced vascular disorder (HCC)   Cocaine abuse   Lactic acidosis   Hypokalemia  Discharge Medications:   Medication List    TAKE these medications        feeding supplement (PRO-STAT SUGAR FREE 64) Liqd  Take 30 mLs by mouth daily.     feeding supplement Liqd  Take 1 Container by mouth 3 (three) times daily between meals.     multivitamin with minerals Tabs tablet  Take 1 tablet by mouth daily.     potassium chloride SA 20 MEQ tablet  Commonly known as:  K-DUR,KLOR-CON  Take 1 tablet (20 mEq total) by mouth 2 (two) times daily.        Disposition and follow-up:   [name] was discharged from [facility] in Stable condition.    1.  At the hospital follow up visit please address:  - consider CSW consult for home health services as this was an issue at previous hospitalization when trying to arrange because of lack of consistent home address - may need extra supplies to care for her right lower extremity wound - encourage cocaine cessation - have her follow up at wound center  2.  Labs / imaging needed at time of follow-up: BMET  3.  Pending labs/ test needing follow-up: none  Follow-up Appointments: Follow-up Information    Follow up with Luanne Bras, MD On 11/20/2015.   Specialty:  Internal Medicine   Why:  appointment time at 2:15pm   Contact information:   [place] [address] [phone]       Discharge Instructions: Discharge Instructions    Diet - low sodium heart healthy    Complete by:  As directed      Increase activity slowly    Complete by:  As directed            Consultations:    Procedures Performed:  Dg Chest 2  View  11/14/2015  CLINICAL DATA:  Chest pain with cough. EXAM: CHEST  2 VIEW COMPARISON:  09/25/2015. FINDINGS: The heart size and mediastinal contours are within normal limits. Mild scarring in the upper lung zones. BILATERAL nipple shadows. No effusion or pneumothorax. No acute osseous findings. Thoracic atherosclerosis. IMPRESSION: No active cardiopulmonary disease. Electronically Signed   By: Staci Righter M.D.   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 She was at a [employer]und[employer] and feeling at baseline prior to this. She reports last using cocaine 2 days ago. Chest pain was accompanied by shortness of breath. She denies any diaphoresis, nausea, dizziness, or radiation of her pain. EMS arrived and gave 361m ASA which she feels improved her pain from moderate to mild but persistent. At the ED EKG was obtained showing no acute changes and initial troponins negative. Mild hypokalemia to 3.0 and lactic acidosis of 2.3 otherwise studies negative.   She was last hospitalized 1/18 for recurrent leg ulceration due to levimasole induced vasculitis. She was discharged on a prednisone taper for this inflammation  that she completed within the past week. She has had previous hospitalization for chest pain in 04/2015 also attributed to cocaine use and found to have anterior lead EKG abnormalities but negative troponins and no major ischemic event.   Hospital Course by problem list: Principal Problem:   Chest pain Active Problems:   Cocaine-induced vascular disorder (HCC)   Cocaine abuse   Lactic acidosis   Hypokalemia   Chest pain: resolved, likely due to cocaine use in days leading up to event. No acute EKG changes, troponin negative x 3, no chest pain the day following admission.  We encourage cocaine cessation.   CSW referral outpatient to assist with substance abuse and assist with home health services to provide wound care.  Cocaine-induced vascular disorder: Longstanding, her primary healthcare problem and underlying mortality risk/cause of other complications. Attributed to levimasole induced vasculitis after extensive negative workup in the past. Was discharged on prednisone taper for recent severe episode last month and reports she completed this.  WOC consulted.  Recommend: Cleansing with NS, pat gently dry. Cover with non-adherent gauze pad, secure with stretch roll gauze/tape on a daily basis.  Send home with wound care supplies.  Discharge Vitals:   BP 160/61 mmHg  Pulse 62  Temp(Src) 98.4 F (36.9 C) (Oral)  Resp 20  Ht 5' 1"  (1.549 m)  Wt 97 lb 14.4 oz (44.407 kg)  BMI 18.51 kg/m2  SpO2 100%  Discharge Labs:  Results for orders placed or performed during the hospital encounter of 11/14/15 (from the past 24 hour(s))  Basic metabolic panel     Status: Abnormal   Collection Time: 11/14/15  9:03 PM  Result Value Ref Range   Sodium 144 135 - 145 mmol/L   Potassium 3.0 (L) 3.5 - 5.1 mmol/L   Chloride 106 101 - 111 mmol/L   CO2 24 22 - 32 mmol/L   Glucose, Bld 84 65 - 99 mg/dL   BUN 15 6 - 20 mg/dL   Creatinine, Ser 0.94 0.44 - 1.00 mg/dL   Calcium 9.9 8.9 - 10.3 mg/dL   GFR calc non Af Amer >60 >60 mL/min   GFR calc Af Amer >60 >60 mL/min   Anion gap 14 5 - 15  CBC     Status: None   Collection Time: 11/14/15  9:03 PM  Result Value Ref Range   WBC 5.1 4.0 - 10.5 K/uL   RBC 4.36 3.87 - 5.11 MIL/uL   Hemoglobin 12.2 12.0 - 15.0 g/dL   HCT 37.0 36.0 - 46.0 %   MCV 84.9 78.0 - 100.0 fL   MCH 28.0 26.0 - 34.0 pg   MCHC 33.0 30.0 - 36.0 g/dL   RDW 14.6 11.5 - 15.5 %   Platelets 272 150 - 400 K/uL  I-stat troponin, ED (not at [facility], [facility])     Status: None   Collection Time: 11/14/15  9:11 PM  Result Value Ref Range   Troponin i, poc 0.00 0.00 - 0.08 ng/mL   Comment 3           I-Stat CG4 Lactic Acid, ED     Status: Abnormal   Collection Time: 11/14/15  9:13 PM  Result Value Ref Range   Lactic Acid, Venous 2.78 (HH) 0.5 - 2.0 mmol/L   Comment NOTIFIED PHYSICIAN   Magnesium     Status: None   Collection Time: 11/14/15 11:39 PM  Result Value Ref Range   Magnesium 1.9 1.7 - 2.4 mg/dL  Phosphorus  Status: None   Collection Time: 11/14/15 11:39 PM  Result Value Ref Range   Phosphorus 3.8 2.5 - 4.6 mg/dL  I-Stat CG4 Lactic Acid, ED     Status: None   Collection Time: 11/14/15 11:54 PM  Result Value Ref Range   Lactic Acid, Venous 0.95 0.5 - 2.0 mmol/L  Urine rapid drug screen (hosp performed)     Status: Abnormal   Collection Time: 11/15/15 12:39 AM  Result Value Ref Range   Opiates NONE DETECTED NONE DETECTED   Cocaine POSITIVE (A) NONE DETECTED   Benzodiazepines NONE DETECTED NONE DETECTED   Amphetamines NONE DETECTED NONE DETECTED   Tetrahydrocannabinol NONE DETECTED NONE DETECTED   Barbiturates NONE DETECTED NONE DETECTED  Troponin I     Status: None   Collection Time: 11/15/15  3:45 AM  Result Value Ref Range   Troponin I <0.03 <0.031 ng/mL  Comprehensive metabolic panel     Status: Abnormal   Collection Time: 11/15/15  3:46 AM  Result Value Ref Range   Sodium 148 (H) 135 - 145 mmol/L   Potassium 3.9 3.5 - 5.1 mmol/L   Chloride 117 (H) 101 - 111 mmol/L   CO2 18 (L) 22 - 32 mmol/L   Glucose, Bld 133 (H) 65 - 99 mg/dL   BUN 14 6 - 20 mg/dL   Creatinine, Ser 0.98 0.44 - 1.00 mg/dL   Calcium 8.7 (L) 8.9 - 10.3 mg/dL   Total Protein 5.2 (L) 6.5 - 8.1 g/dL   Albumin 2.7 (L) 3.5 - 5.0 g/dL   AST 15 15 - 41 U/L   ALT 9 (L) 14 - 54 U/L   Alkaline Phosphatase 51 38 - 126 U/L   Total Bilirubin 0.3 0.3 - 1.2 mg/dL   GFR calc non Af Amer >60 >60 mL/min   GFR calc Af Amer >60 >60 mL/min   Anion gap 13 5 - 15  Troponin I     Status: None   Collection Time: 11/15/15  9:21 AM  Result Value Ref Range   Troponin I <0.03 <0.031 ng/mL     Signed: Jule Ser, DO 11/15/2015, 12:49 PM

## 2015-11-15 NOTE — ED Notes (Signed)
Patient is resting comfortably. 

## 2015-11-15 NOTE — Progress Notes (Signed)
Subjective: [name] was seen and examined this morning on rounds.  She had no acute events overnight.  Not complaining of chest pain and is resting comfortably in the bed.  Objective: Vital signs in last 24 hours: Filed Vitals:   11/15/15 0230 11/15/15 0300 11/15/15 0330 11/15/15 0420  BP: 133/72 141/74 132/78 160/61  Pulse: 82 80 73 62  Temp:    98.4 F (36.9 C)  TempSrc:    Oral  Resp: 19 18 17 20   Height:    5' 1"  (1.549 m)  Weight:    97 lb 14.4 oz (44.407 kg)  SpO2: 98% 98% 100% 100%   Weight change:   Intake/Output Summary (Last 24 hours) at 11/15/15 1105 Last data filed at 11/15/15 0800  Gross per 24 hour  Intake    240 ml  Output    100 ml  Net    140 ml   General: resting in bed, no distress Cardiac: RRR, no rubs, murmurs or gallops Pulm: normal work of breathing Abd: soft, nontender, nondistended, BS present Ext: right lower leg in ACE wrap, photo of wound reviewed in EMR  Lab Results: Basic Metabolic Panel:  Recent Labs Lab 11/14/15 2103 11/14/15 2339 11/15/15 0346  NA 144  --  148*  K 3.0*  --  3.9  CL 106  --  117*  CO2 24  --  18*  GLUCOSE 84  --  133*  BUN 15  --  14  CREATININE 0.94  --  0.98  CALCIUM 9.9  --  8.7*  MG  --  1.9  --   PHOS  --  3.8  --    Liver Function Tests:  Recent Labs Lab 11/15/15 0346  AST 15  ALT 9*  ALKPHOS 51  BILITOT 0.3  PROT 5.2*  ALBUMIN 2.7*   No results for input(s): LIPASE, AMYLASE in the last 168 hours. No results for input(s): AMMONIA in the last 168 hours. CBC:  Recent Labs Lab 11/14/15 2103  WBC 5.1  HGB 12.2  HCT 37.0  MCV 84.9  PLT 272   Cardiac Enzymes:  Recent Labs Lab 11/15/15 0345 11/15/15 0921  TROPONINI <0.03 <0.03   BNP: No results for input(s): PROBNP in the last 168 hours. D-Dimer: No results for input(s): DDIMER in the last 168 hours. CBG: No results for input(s): GLUCAP in the last 168 hours. Hemoglobin A1C: No results for input(s): HGBA1C in the last 168  hours. Fasting Lipid Panel: No results for input(s): CHOL, HDL, LDLCALC, TRIG, CHOLHDL, LDLDIRECT in the last 168 hours. Thyroid Function Tests: No results for input(s): TSH, T4TOTAL, FREET4, T3FREE, THYROIDAB in the last 168 hours. Coagulation: No results for input(s): LABPROT, INR in the last 168 hours. Anemia Panel: No results for input(s): VITAMINB12, FOLATE, FERRITIN, TIBC, IRON, RETICCTPCT in the last 168 hours. Urine Drug Screen: Drugs of Abuse     Component Value Date/Time   LABOPIA NONE DETECTED 11/15/2015 0039   LABOPIA PPS 08/31/2012 1045   COCAINSCRNUR POSITIVE* 11/15/2015 0039   COCAINSCRNUR NEG 08/31/2012 1045   LABBENZ NONE DETECTED 11/15/2015 0039   LABBENZ NEG 08/31/2012 1045   LABBENZ NEGATIVE 06/12/2011 0221   AMPHETMU NONE DETECTED 11/15/2015 0039   AMPHETMU NEG 08/31/2012 1045   AMPHETMU NEGATIVE 06/12/2011 0221   THCU NONE DETECTED 11/15/2015 0039   THCU NEG 08/31/2012 1045   THCU 63.4* 05/20/2012 1405   LABBARB NONE DETECTED 11/15/2015 0039   LABBARB NEG 08/31/2012 1045    Alcohol  Level: No results for input(s): ETH in the last 168 hours. Urinalysis: No results for input(s): COLORURINE, LABSPEC, PHURINE, GLUCOSEU, HGBUR, BILIRUBINUR, KETONESUR, PROTEINUR, UROBILINOGEN, NITRITE, LEUKOCYTESUR in the last 168 hours.  Invalid input(s): APPERANCEUR Misc. Labs:   Micro Results: No results found for this or any previous visit (from the past 240 hour(s)). Studies/Results: Dg Chest 2 View  11/14/2015  CLINICAL DATA:  Chest pain with cough. EXAM: CHEST  2 VIEW COMPARISON:  09/25/2015. FINDINGS: The heart size and mediastinal contours are within normal limits. Mild scarring in the upper lung zones. BILATERAL nipple shadows. No effusion or pneumothorax. No acute osseous findings. Thoracic atherosclerosis. IMPRESSION: No active cardiopulmonary disease. Electronically Signed   By: Staci Righter M.D.   On: 11/14/2015 21:13   Medications: I have reviewed the  patient's current medications. Scheduled Meds: . diclofenac sodium  2 g Topical QID  . enoxaparin (LOVENOX) injection  20 mg Subcutaneous Q24H  . feeding supplement  1 Container Oral TID BM  . multivitamin with minerals  1 tablet Oral Daily  . potassium chloride SA  20 mEq Oral BID  . sodium chloride flush  3 mL Intravenous Q12H   Continuous Infusions:  PRN Meds:.ibuprofen, promethazine, senna-docusate Assessment/Plan: Principal Problem:   Chest pain Active Problems:   Cocaine-induced vascular disorder (HCC)   Cocaine abuse   Lactic acidosis   Hypokalemia  Chest pain: resolved, likely due to cocaine use.  No acute EKG changes, troponin negative x 3 - encourage cocaine cessation - CSW referral outpatient  - discharge home  Cocaine-induced vascular disorder: Longstanding, her primary healthcare problem and underlying mortality risk/cause of other complications. Attributed to levimasole induced vasculitis after extensive negative workup in the past. Was discharged on prednisone taper for recent severe episode last month. - WOC consulted, appreciate assistance - send home with wound care supplies - needs to follow up with wound care, PCP, and CSW for possible home health services  Protin-calorie malnutrition, severe, chronic On nutritional supplements, vitamins, electrolyte replacement PTA. -Regular diet -Nutritional supplement, vitamin  Diet: Regular  VTE ppx: [place] enoxaparin  FULL CODE  Dispo: Home today with [facility] follow up.  The patient does have a current PCP (Zada Finders, MD) and does need an [facility] hospital follow-up appointment after discharge.  The patient does not have transportation limitations that hinder transportation to clinic appointments.  .Services Needed at time of discharge: Y = Yes, Blank = No PT:   OT:   RN:   Equipment:   Other:       Jule Ser, DO 11/15/2015, 11:05 AM

## 2015-11-15 NOTE — Progress Notes (Signed)
Patient given discharge follow up and instructions. Patient provided taxi voucher. No questions or concerns at this time.

## 2015-11-19 ENCOUNTER — Telehealth: Payer: Self-pay | Admitting: Internal Medicine

## 2015-11-19 LAB — CULTURE, BLOOD (ROUTINE X 2)
CULTURE: NO GROWTH
Culture: NO GROWTH

## 2015-11-19 NOTE — Telephone Encounter (Signed)
APPT REMINDER CALL/LMTCB IF SHE NEEDS TO CANCEL

## 2015-11-20 ENCOUNTER — Encounter: Payer: Self-pay | Admitting: Internal Medicine

## 2015-11-20 ENCOUNTER — Ambulatory Visit (INDEPENDENT_AMBULATORY_CARE_PROVIDER_SITE_OTHER): Payer: Medicaid Other | Admitting: Internal Medicine

## 2015-11-20 VITALS — BP 165/78 | HR 80 | Temp 97.4°F | Ht 61.0 in | Wt 100.5 lb

## 2015-11-20 DIAGNOSIS — F14288 Cocaine dependence with other cocaine-induced disorder: Secondary | ICD-10-CM

## 2015-11-20 DIAGNOSIS — I10 Essential (primary) hypertension: Secondary | ICD-10-CM | POA: Diagnosis not present

## 2015-11-20 DIAGNOSIS — F14988 Cocaine use, unspecified with other cocaine-induced disorder: Secondary | ICD-10-CM

## 2015-11-20 DIAGNOSIS — I776 Arteritis, unspecified: Secondary | ICD-10-CM

## 2015-11-20 DIAGNOSIS — L958 Other vasculitis limited to the skin: Secondary | ICD-10-CM | POA: Diagnosis present

## 2015-11-20 DIAGNOSIS — E876 Hypokalemia: Secondary | ICD-10-CM | POA: Diagnosis not present

## 2015-11-20 DIAGNOSIS — R079 Chest pain, unspecified: Secondary | ICD-10-CM

## 2015-11-20 DIAGNOSIS — I999 Unspecified disorder of circulatory system: Principal | ICD-10-CM

## 2015-11-20 DIAGNOSIS — Z8679 Personal history of other diseases of the circulatory system: Secondary | ICD-10-CM

## 2015-11-20 MED ORDER — MELOXICAM 7.5 MG PO TABS: 7.5000 mg | ORAL_TABLET | Freq: Every day | ORAL | Status: DC

## 2015-11-20 MED ORDER — POTASSIUM CHLORIDE CRYS ER 20 MEQ PO TBCR: 20.0000 meq | EXTENDED_RELEASE_TABLET | Freq: Two times a day (BID) | ORAL | Status: DC

## 2015-11-20 NOTE — Assessment & Plan Note (Signed)
No further chest pain since hospital admission

## 2015-11-20 NOTE — Progress Notes (Signed)
   Subjective:   [name]   DOB: 1961-10-01 55 y.o.   MRN: [id number]  HPI: [name] is a 55 y.o. female w/ PMHx of Levamisole induced vasculitis, HTN, cocaine abuse, anemia, and depression, presents to the clinic today for a hospital follow-up visit. Patient was recently admitted for cocaine induced chest pain, discharged on 11/15/15. Today, she is doing well, denies any further chest pain, no SOB, dizziness, lightheadedness, or palpitations. Still smoking crack, as recently as 2 days ago per patient. Her main complaint is that of a right calf wound 2/2 levamisole induced vasculitis. The patient has been changing her own dressing with Telfa and a dry dressing. No fever, chills, nausea, or vomiting.   Current Outpatient Prescriptions  Medication Sig Dispense Refill  . Amino Acids-Protein Hydrolys (FEEDING SUPPLEMENT, PRO-STAT SUGAR FREE 64,) LIQD Take 30 mLs by mouth daily. 900 mL 0  . feeding supplement (BOOST / RESOURCE BREEZE) LIQD Take 1 Container by mouth 3 (three) times daily between meals. 90 Container 3  . Multiple Vitamin (MULTIVITAMIN WITH MINERALS) TABS tablet Take 1 tablet by mouth daily. 30 tablet 0  . potassium chloride SA (K-DUR,KLOR-CON) 20 MEQ tablet Take 1 tablet (20 mEq total) by mouth 2 (two) times daily. 30 tablet 2   No current facility-administered medications for this visit.    Review of Systems: General: Denies fever, chills, diaphoresis, appetite change and fatigue.  Respiratory: Denies SOB, DOE, cough, and wheezing.   Cardiovascular: Denies chest pain and palpitations.  Gastrointestinal: Denies nausea, vomiting, abdominal pain, and diarrhea.  Genitourinary: Denies dysuria, increased frequency, and flank pain. Endocrine: Denies hot or cold intolerance, polyuria, and polydipsia. Musculoskeletal: Positive for right leg pain. Denies back pain, joint swelling, arthralgias and gait problem.  Skin: Denies pallor, rash and wounds.    Neurological: Denies dizziness, seizures, syncope, weakness, lightheadedness, numbness and headaches.  Psychiatric/Behavioral: Denies mood changes, and sleep disturbances.  Objective:   Physical Exam: Filed Vitals:   11/20/15 1420  BP: 165/78  Pulse: 80  Temp: 97.4 F (36.3 C)  TempSrc: Oral  Height: 5' 1"  (1.549 m)  Weight: 100 lb 8 oz (45.587 kg)  SpO2: 100%    General: Thin AA female, disheveled. Alert, cooperative, NAD. HEENT: PERRL, EOMI. Moist mucus membranes Neck: Full range of motion without pain, supple, no lymphadenopathy or carotid bruits Lungs: Clear to ascultation bilaterally, normal work of respiration, no wheezes, rales, rhonchi Heart: RRR, no murmurs, gallops, or rubs Abdomen: Soft, non-tender, non-distended, BS + Extremities: No cyanosis, clubbing, or edema. Scattered healed wounds on upper and lower extremities bilaterally. Right calf with foul smelling, large (9 cm x 4 cm) shallow vasculitic wound. Minimal surrounding erythema. Scant serosanguinous drainage. No pus.  Neurologic: Alert & oriented X3, cranial nerves II-XII intact, strength grossly intact, sensation intact to light touch   Assessment & Plan:   Please see problem based assessment and plan.

## 2015-11-20 NOTE — Assessment & Plan Note (Signed)
Has been taking K-dur 20 mEq bid. Ran out -Recheck BMP -Refill K

## 2015-11-20 NOTE — Assessment & Plan Note (Signed)
BP elevated currently, not taking any medications for this. Has previously been on ACEI, but not currently. Current elevation may be related to pain associated with current wound. -Readdress at next clinic visit.

## 2015-11-20 NOTE — Assessment & Plan Note (Signed)
Recent admission for cocaine induced chest pain. Has not had chest pain since. Still actively using, as recently as 2 days ago per the patient. Has vasculitis wound on right calf as described in "vasculitis" A&P.  -No further management with regards to chest pain -Changed right calf dressing -Wound care referral

## 2015-11-20 NOTE — Addendum Note (Signed)
Addended by: Sander Nephew F on: 11/20/2015 03:45 PM   Modules accepted: Orders

## 2015-11-20 NOTE — Assessment & Plan Note (Signed)
Current vasculitic wound is present on her right calf. 9-10 cm long, 4 cm wide, shallow, mild surrounding erythema, serosanguinous drainage. Tender. Cleaned with sterile saline and 4x4. Replaced Telfa dry dressing a wrapped with gauze wrap. Given supplies to changes dressing at home.  -Given referral for wound clinic. Will need to take bus to [facility] for wound care. Patient is okay with this.  -Given Mobic 7.5 mg daily prn #30 for pain and inflammation.  -RTC in 3 months -Reinforced importance of abstaining from crack-cocaine use

## 2015-11-20 NOTE — Patient Instructions (Addendum)
1. Please make a follow up appointment for 3 months.   2. Please take all medications as previously prescribed with the following changes:  Take 7.5 mg Mobic once daily ONLY AS NEEDED for pain. This is an anti-inflammatory medicine.   3. If you have worsening of your symptoms or new symptoms arise, please call the clinic ([phone]), or go to the ER immediately if symptoms are severe.

## 2015-11-21 LAB — BMP8+ANION GAP
Anion Gap: 19 mmol/L — ABNORMAL HIGH (ref 10.0–18.0)
BUN/Creatinine Ratio: 13 (ref 9–23)
BUN: 11 mg/dL (ref 6–24)
CALCIUM: 9.3 mg/dL (ref 8.7–10.2)
CO2: 21 mmol/L (ref 18–29)
Chloride: 104 mmol/L (ref 96–106)
Creatinine, Ser: 0.86 mg/dL (ref 0.57–1.00)
GFR, EST AFRICAN AMERICAN: 90 mL/min/{1.73_m2} (ref >59)
GFR, EST NON AFRICAN AMERICAN: 78 mL/min/{1.73_m2} (ref >59)
Glucose: 91 mg/dL (ref 65–99)
POTASSIUM: 4 mmol/L (ref 3.5–5.2)
Sodium: 144 mmol/L (ref 134–144)

## 2015-11-22 ENCOUNTER — Ambulatory Visit: Payer: Self-pay | Admitting: General Surgery

## 2015-11-25 NOTE — Progress Notes (Signed)
Internal Medicine Clinic Attending  Case discussed with Dr. Jones at the time of the visit.  We reviewed the resident's history and exam and pertinent patient test results.  I agree with the assessment, diagnosis, and plan of care documented in the resident's note.  

## 2015-12-13 ENCOUNTER — Emergency Department (HOSPITAL_COMMUNITY)
Admission: EM | Admit: 2015-12-13 | Discharge: 2015-12-13 | Disposition: A | Discharge status: Home / Self Care | Payer: Medicaid Other | Attending: Emergency Medicine | Admitting: Emergency Medicine

## 2015-12-13 ENCOUNTER — Encounter (HOSPITAL_COMMUNITY): Payer: Self-pay | Admitting: Emergency Medicine

## 2015-12-13 DIAGNOSIS — L97811 Non-pressure chronic ulcer of other part of right lower leg limited to breakdown of skin: Secondary | ICD-10-CM | POA: Diagnosis not present

## 2015-12-13 DIAGNOSIS — Z8701 Personal history of pneumonia (recurrent): Secondary | ICD-10-CM | POA: Insufficient documentation

## 2015-12-13 DIAGNOSIS — Z862 Personal history of diseases of the blood and blood-forming organs and certain disorders involving the immune mechanism: Secondary | ICD-10-CM | POA: Diagnosis not present

## 2015-12-13 DIAGNOSIS — I10 Essential (primary) hypertension: Secondary | ICD-10-CM | POA: Insufficient documentation

## 2015-12-13 DIAGNOSIS — T148XXA Other injury of unspecified body region, initial encounter: Secondary | ICD-10-CM

## 2015-12-13 DIAGNOSIS — Z8659 Personal history of other mental and behavioral disorders: Secondary | ICD-10-CM | POA: Insufficient documentation

## 2015-12-13 DIAGNOSIS — L97821 Non-pressure chronic ulcer of other part of left lower leg limited to breakdown of skin: Secondary | ICD-10-CM | POA: Insufficient documentation

## 2015-12-13 DIAGNOSIS — Z791 Long term (current) use of non-steroidal anti-inflammatories (NSAID): Secondary | ICD-10-CM | POA: Diagnosis not present

## 2015-12-13 DIAGNOSIS — I83028 Varicose veins of left lower extremity with ulcer other part of lower leg: Secondary | ICD-10-CM | POA: Insufficient documentation

## 2015-12-13 DIAGNOSIS — L959 Vasculitis limited to the skin, unspecified: Secondary | ICD-10-CM | POA: Diagnosis not present

## 2015-12-13 DIAGNOSIS — IMO0001 Reserved for inherently not codable concepts without codable children: Secondary | ICD-10-CM

## 2015-12-13 DIAGNOSIS — L03116 Cellulitis of left lower limb: Secondary | ICD-10-CM | POA: Diagnosis present

## 2015-12-13 DIAGNOSIS — I83018 Varicose veins of right lower extremity with ulcer other part of lower leg: Secondary | ICD-10-CM | POA: Insufficient documentation

## 2015-12-13 DIAGNOSIS — M069 Rheumatoid arthritis, unspecified: Secondary | ICD-10-CM | POA: Insufficient documentation

## 2015-12-13 DIAGNOSIS — Z79899 Other long term (current) drug therapy: Secondary | ICD-10-CM | POA: Insufficient documentation

## 2015-12-13 DIAGNOSIS — F17210 Nicotine dependence, cigarettes, uncomplicated: Secondary | ICD-10-CM | POA: Insufficient documentation

## 2015-12-13 MED ORDER — IBUPROFEN 800 MG PO TABS
800.0000 mg | ORAL_TABLET | Freq: Once | ORAL | Status: AC
  Administered 2015-12-13: 800 mg via ORAL
  Filled 2015-12-13: qty 1

## 2015-12-13 MED ORDER — IBUPROFEN 600 MG PO TABS: 600.0000 mg | ORAL_TABLET | Freq: Four times a day (QID) | ORAL | Status: DC | PRN

## 2015-12-13 NOTE — ED Provider Notes (Addendum)
CSN: [id number]     Arrival date & time 12/13/15  1018 History   First MD Initiated Contact with Patient 12/13/15 1502     Chief Complaint  Patient presents with  . Cellulitis     (Consider location/radiation/quality/duration/timing/severity/associated sxs/prior Treatment) HPI Heart lower extremity wounds for over a month. Patient reports they are still not healing. She has not changed dressings in several days. She has had referral to wound care clinic but reports she was unable to go due to transportation issues. The patient reports continued drainage from the wounds and requests pain medications. She reports a similar wound to her left third digit but does not know how it started. She denies any acute injury. Past Medical History  Diagnosis Date  . 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 Followed by Dr. Louanne Skye  . Hypertension   . Cocaine abuse     ongoing with resultant vaculitis.  . Depression   . Normocytic anemia     BL Hgb 9.8-12. Last anemia panel 04/2010 - showing Fe 19, ferritin 101.  Pt on monthly B12 injections  . 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 CAP (community acquired pneumonia) 03/2014 X 2  . Daily headache   . Migraines     "probably 5-6/yr" (04/21/2014)  . Rheumatoid arthritis(714.0)     patient reported  . Inflammatory arthritis [facility])    Past Surgical History  Procedure Laterality Date  . Skin biopsy Bilateral 04/2010    shin nodules  . Irrigation and debridement abscess Bilateral 09/26/2013    Procedure: DEBRIDEMENT ULCERS BILATERAL THIGHS;  Surgeon: Gwenyth Ober, MD;  Location: [facility];  Service: General;  Laterality: Bilateral;  . Hernia repair      "stomach"  . I&d extremity Right 09/26/2015    Procedure: IRRIGATION AND DEBRIDEMENT LEG WOUND  VAC PLACEMENT.;  Surgeon: Loel Lofty Dillingham, DO;  Location: [facility];  Service: Plastics;  Laterality: Right;   Family History  Problem Relation Age of Onset  . Breast  cancer Mother     Breast cancer  . Alcohol abuse Mother   . Colon cancer Maternal Aunt 92  . Alcohol abuse Father    Social History  Substance Use Topics  . Smoking status: Current Every Day Smoker -- 0.30 packs/day for 34 years    Types: Cigarettes  . Smokeless tobacco: Never Used     Comment: "Trying very hard". / 4-5 CIGARETTES A DAY  . Alcohol Use: No   OB History    No data available     Review of Systems Constitutional: No fevers no chills Skin: Multiple recurrent areas of wounds on the lower legs.   Allergies  Acetaminophen  Home Medications   Prior to Admission medications   Medication Sig Start Date End Date Taking? Authorizing Provider  meloxicam (MOBIC) 7.5 MG tablet Take 1 tablet (7.5 mg total) by mouth daily. 11/20/15 11/19/16 Yes Corky Sox, MD  potassium chloride SA (K-DUR,KLOR-CON) 20 MEQ tablet Take 1 tablet (20 mEq total) by mouth 2 (two) times daily. 11/20/15  Yes Corky Sox, MD  Amino Acids-Protein Hydrolys (FEEDING SUPPLEMENT, PRO-STAT SUGAR FREE 64,) LIQD Take 30 mLs by mouth daily. Patient not taking: Reported on 12/13/2015 10/28/15   Jule Ser, DO  feeding supplement (BOOST / RESOURCE BREEZE) LIQD Take 1 Container by mouth 3 (three) times daily between meals. Patient not taking: Reported on 12/13/2015 10/28/15   Jule Ser, DO  ibuprofen (ADVIL,MOTRIN) 600 MG tablet Take 1 tablet (600 mg total) by mouth every 6 (six) hours as needed. 12/13/15   Charlesetta Shanks, MD  Multiple Vitamin (MULTIVITAMIN WITH MINERALS) TABS tablet Take 1 tablet by mouth daily. Patient not taking: Reported on 12/13/2015 10/28/15   Jule Ser, DO   BP 174/87 mmHg  Pulse 99  Temp(Src) 98.5 F (36.9 C) (Oral)  Resp 18  SpO2 100% Physical Exam  Constitutional: She is oriented to person, place, and time.  Patient is deconditioned and discheveled in appearance. She is alert and nontoxic. She does not appear acutely ill. Her mental status is clear.  HENT:  Head:  Normocephalic and atraumatic.  Eyes: EOM are normal.  Cardiovascular: Normal rate, regular rhythm and normal heart sounds.   Pulmonary/Chest: Effort normal and breath sounds normal.  Musculoskeletal: Normal range of motion.  Patient has full use of her extremities. Upper extremities are used without limitation to remove her dressings of the lower legs. Lower extremity does not have peripheral edema but do have several surgical wounds as illustrated in attached images. Also as imaged are multiple older healed small eschar's.  Neurological: She is alert and oriented to person, place, and time. No cranial nerve deficit. She exhibits normal muscle tone. Coordination normal.  Skin: Skin is warm and dry.  Psychiatric: She has a normal mood and affect.              ED Course  Procedures (including critical care time) Labs Review Labs Reviewed - No data to display  Imaging Review No results found. I have personally reviewed and evaluated these images and lab results as part of my medical decision-making.   EKG Interpretation None      MDM   Final diagnoses:  Venous stasis ulcers, left (HCC)  Venous stasis ulcer, right (HCC)  Nonhealing nonsurgical wound limited to breakdown of skin  Vasculitis of skin   Patient has chronic, nonhealing wounds. 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 At this time, wounds do not appear secondarily infected. There are chronic in nature and require chronic treatment through wound care clinic. Patient is counseled on this. She has missed appointment due to transportation issues. Care management has been consult it to assist the patient in social needs to access healthcare resources. Patient requests pain medication. Ibuprofen will be provided. No narcotics are provided for a chronic condition.  Case management has arranged home nursing care for assistance in wound care and transportation  for wound care clinic.    Charlesetta Shanks, MD 12/13/15 1528  Charlesetta Shanks, MD 12/13/15 [phone]

## 2015-12-13 NOTE — Progress Notes (Signed)
Pt given a copy of this  Follow-up With Details [place]  [place] on 12/31/2015 You are scheduled for an appointment on Tuesday December 31 2015 at 1 pm  [address] [address] [place], [place] [address] [phone]   See your family doctor for recheck next week. Schedule an appointment To be seen at the wound care clinic next week as well.        Pt states she understands her her d/c instructions and states she understands how to get transportation to changed appt in [place] on 12/31/15 1 pm   Pt ready for d/c ED RN in room reviewing items with pt

## 2015-12-13 NOTE — ED Notes (Signed)
Pt reports she had surgery several months ago. Pt reports the incision site for the wound vac on her R and L legs have not healed up well. Now having drainage from old incision site. Pt also wants to be seen for bleeding on her L middle finger. Cannot remember any injury to finger.

## 2015-12-13 NOTE — Care Management Note (Addendum)
Case Management Note  Patient Details  Name: RITAMARIE ARKIN MRN: [id number] Date of Birth: 12-04-1960  Subjective/Objective:    Patient presents to ED with non healing venous stasis wounds to legs.                Action/Plan: Patient having difficulty with transportation to wound care clinic.  Patient was referred to wound care clinic in [place] by pcp.  Patient lives in [place].  [facility] provided patient with Medicaid transport contact number to assist with transportation.  EDCM spoke to [place] at patient pcp office [facility] who called and made appointment for patient at [facility] wound care clinic on [address] for March 28, at 130pm.  [facility] discussed home health services for wound care to bridge patient until she can get to wound care clinic.  Patient is unable to change dressings properly on her own.  Patient agreeable to home health services with [facility] home health for RN for wound care.  Patient was provided contact information for [facility] and was provided appointment information for wound care clinic.  Patient thankful for assistance.  No further EDCM needs at this time. Discussed with EDP.  [facility] with confirmation of receipt.   Expected Discharge Date:                  Expected Discharge Plan:  [place]  In-House Referral:     Discharge planning Services  CM Consult  Post Acute Care Choice:  Home Health Choice offered to:  Patient  DME Arranged:    DME Agency:     HH Arranged:  RN [place] Agency:  [facility]  Status of Service:  Completed, signed off  Medicare Important Message Given:    Date Medicare IM Given:    Medicare IM give by:    Date Additional Medicare IM Given:    Additional Medicare Important Message give by:     If discussed at [place] of Stay Meetings, dates discussed:    Additional CommentsLivia Snellen, RN 12/13/2015, 5:31 PM

## 2015-12-13 NOTE — Discharge Instructions (Signed)
Venous Ulcer It is very important that you see your family doctor for recheck and follow through on referral to wound care clinic. A venous ulcer is a shallow sore on your lower leg that is caused by poor circulation in your veins. This condition used to be called stasis ulcer.  Veins have valves that help return blood to the heart. If these valves do not work properly, it can cause blood to flow backward and to back up into the veins near the skin. When that happens, blood can pool in your lower legs. The blood can then leak out of your veins, which can irritate your skin. This may cause a break in your skin that becomes a venous ulcer. Venous ulcer is the most common type of lower leg ulcer. You may have venous ulcers on one leg or on both legs. The area where this condition most commonly develops is around the ankles. A venous ulcer may last for a long time (chronic ulcer) or it may return repeatedly (recurrent ulcer). CAUSES Any condition that causes poor circulation to your legs can lead to a venous ulcer.  RISK FACTORS This condition is more likely to develop in:  People who are 63 years of age or older.  People who are overweight.  People who are not active.  People who have had a leg ulcer in the past.  People who have clots in their lower leg veins (deep vein thrombosis).  People who have inflammation of their leg veins (phlebitis).  Women who have given birth.  People who smoke. SYMPTOMS  The most common symptom of this condition is an open sore near your ankle. Other symptoms may include:  Swelling.  Thickening of the skin.  Fluid leaking from the ulcer.  Bleeding.  Itching.  Pain and swelling that gets worse when you stand up and feels better when you raise your leg.  Blotchy skin.  Darkening of the skin. DIAGNOSIS  Your health care provider may suspect a venous ulcer based on your medical history and your risk factors. Your health care provider will check the  skin on your legs. Other tests may be done to learn more about the ulcer and to determine the best way to treat it. Tests that may be done include:  Measuring the blood pressure in your arms and legs.  Using sound waves (ultrasound) to measure the blood flow in your leg veins. TREATMENT You may need to try several different types of treatment to get your venous ulcer to heal. Healing may take a long time. Treatment may include:  Keeping your leg raised (elevated).  Wearing a type of bandage or stocking to compress the veins of your leg (compression therapy). Venous wounds are not likely to heal or to stay healed without compression.  Taking medicines to improve blood flow.  Taking antibiotic medicines to treat infection.  Cleaning your ulcer and removing any dead tissue from the wound (debridement).  Placing various types of medicated bandage (dressings) or medicated wraps on your ulcer. This helps the ulcer to heal and helps to prevent infection. Surgery is sometimes needed to close the wound using a piece of skin taken from another area of your body (graft). You may need surgery if other treatments are not working or if your ulcer is very deep. HOME CARE INSTRUCTIONS Wound Care  Follow instructions from your health care provider about:  How to take care of your wound.  When and how you should change your bandage (dressing).  When you should remove your dressing. If your dressing is dry and sticks to your leg when you try to remove it, moisten or wet the dressing with saline solution or water so that the dressing can be removed without harming your skin or wound tissue.  Check your wound every day for signs of infection. Have a caregiver do this for you if you are not able to do it yourself. Check for:  More redness, swelling, or pain. More fluid or blood.  Pus, warmth, or a bad smell. Medicines   Take over-the-counter and prescription medicines only as told by your health  care provider.  If you were prescribed an antibiotic medicine, take it or apply it as told by your health care provider. Do not stop taking or using the antibiotic even if your condition improves. Activity  Do not stand or sit in one position for a long period of time. Rest with your legs raised during the day. If possible, keep your legs above your heart for 30 minutes, 3-4 times a day, or as told by your health care provider.  Do not sit with your legs crossed.   Walk often to increase the blood flow in your legs.Ask your health care provider what level of activity is safe for you.  If you are taking a long ride in a car or plane, take a break to walk around at least once every two hours, or as often as your health care provider recommends. Ask your health care provider if you should take aspirin before long trips.  General Instructions  Wear elastic stockings, compression stockings, or support hose as told by your health care provider. This is very important.  Raise the foot of your bed as told by your health care provider.  Do not smoke.  Keep all follow-up visits as told by your health care provider. This is important. SEEK MEDICAL CARE IF:   You have a fever.   Your ulcer is getting larger or is not healing.   Your pain gets worse.   You have more redness or swelling around your ulcer.  You have more fluid, blood, or pus coming from your ulcer after it has been cleaned by you or your health care provider.  You have warmth or a bad smell coming from your ulcer.   This information is not intended to replace advice given to you by your health care provider. Make sure you discuss any questions you have with your health care provider.   Document Released: 06/16/2001 Document Revised: 06/12/2015 Document Reviewed: 01/30/2015 Elsevier Interactive Patient Education [employer].

## 2015-12-19 ENCOUNTER — Telehealth: Payer: Self-pay | Admitting: General Practice

## 2015-12-31 ENCOUNTER — Encounter (HOSPITAL_BASED_OUTPATIENT_CLINIC_OR_DEPARTMENT_OTHER): Payer: Medicaid Other | Attending: Surgery

## 2016-01-30 ENCOUNTER — Emergency Department (HOSPITAL_COMMUNITY)
Admission: EM | Admit: 2016-01-30 | Discharge: 2016-01-30 | Disposition: A | Discharge status: Home / Self Care | Payer: Medicaid Other | Attending: Emergency Medicine | Admitting: Emergency Medicine

## 2016-01-30 ENCOUNTER — Encounter (HOSPITAL_COMMUNITY): Payer: Self-pay | Admitting: Adult Health

## 2016-01-30 ENCOUNTER — Emergency Department (HOSPITAL_COMMUNITY): Payer: Medicaid Other

## 2016-01-30 ENCOUNTER — Encounter (HOSPITAL_BASED_OUTPATIENT_CLINIC_OR_DEPARTMENT_OTHER): Payer: Medicaid Other

## 2016-01-30 DIAGNOSIS — Z8659 Personal history of other mental and behavioral disorders: Secondary | ICD-10-CM | POA: Insufficient documentation

## 2016-01-30 DIAGNOSIS — J069 Acute upper respiratory infection, unspecified: Secondary | ICD-10-CM | POA: Insufficient documentation

## 2016-01-30 DIAGNOSIS — M545 Low back pain: Secondary | ICD-10-CM | POA: Diagnosis present

## 2016-01-30 DIAGNOSIS — M069 Rheumatoid arthritis, unspecified: Secondary | ICD-10-CM | POA: Diagnosis not present

## 2016-01-30 DIAGNOSIS — M5416 Radiculopathy, lumbar region: Secondary | ICD-10-CM

## 2016-01-30 DIAGNOSIS — Z791 Long term (current) use of non-steroidal anti-inflammatories (NSAID): Secondary | ICD-10-CM | POA: Diagnosis not present

## 2016-01-30 DIAGNOSIS — Z862 Personal history of diseases of the blood and blood-forming organs and certain disorders involving the immune mechanism: Secondary | ICD-10-CM | POA: Insufficient documentation

## 2016-01-30 DIAGNOSIS — B9789 Other viral agents as the cause of diseases classified elsewhere: Secondary | ICD-10-CM

## 2016-01-30 DIAGNOSIS — F17210 Nicotine dependence, cigarettes, uncomplicated: Secondary | ICD-10-CM | POA: Diagnosis not present

## 2016-01-30 DIAGNOSIS — J988 Other specified respiratory disorders: Secondary | ICD-10-CM

## 2016-01-30 DIAGNOSIS — Z8701 Personal history of pneumonia (recurrent): Secondary | ICD-10-CM | POA: Diagnosis not present

## 2016-01-30 DIAGNOSIS — I10 Essential (primary) hypertension: Secondary | ICD-10-CM | POA: Diagnosis not present

## 2016-01-30 DIAGNOSIS — Z79899 Other long term (current) drug therapy: Secondary | ICD-10-CM | POA: Diagnosis not present

## 2016-01-30 DIAGNOSIS — R000 Tachycardia, unspecified: Secondary | ICD-10-CM | POA: Diagnosis not present

## 2016-01-30 DIAGNOSIS — L958 Other vasculitis limited to the skin: Secondary | ICD-10-CM | POA: Diagnosis not present

## 2016-01-30 LAB — CBC WITH DIFFERENTIAL/PLATELET
Basophils Absolute: 0 10*3/uL (ref 0.0–0.1)
Basophils Relative: 0 %
EOS PCT: 2 %
Eosinophils Absolute: 0.1 10*3/uL (ref 0.0–0.7)
HEMATOCRIT: 33.3 % — AB (ref 36.0–46.0)
Hemoglobin: 11.2 g/dL — ABNORMAL LOW (ref 12.0–15.0)
LYMPHS PCT: 30 %
Lymphs Abs: 1.8 10*3/uL (ref 0.7–4.0)
MCH: 28.7 pg (ref 26.0–34.0)
MCHC: 33.6 g/dL (ref 30.0–36.0)
MCV: 85.4 fL (ref 78.0–100.0)
MONO ABS: 0.4 10*3/uL (ref 0.1–1.0)
MONOS PCT: 6 %
NEUTROS ABS: 3.7 10*3/uL (ref 1.7–7.7)
Neutrophils Relative %: 62 %
PLATELETS: 307 10*3/uL (ref 150–400)
RBC: 3.9 MIL/uL (ref 3.87–5.11)
RDW: 14.2 % (ref 11.5–15.5)
WBC: 5.9 10*3/uL (ref 4.0–10.5)

## 2016-01-30 LAB — I-STAT CHEM 8, ED
BUN: 19 mg/dL (ref 6–20)
CALCIUM ION: 1.11 mmol/L — AB (ref 1.12–1.23)
CREATININE: 0.7 mg/dL (ref 0.44–1.00)
Chloride: 104 mmol/L (ref 101–111)
GLUCOSE: 85 mg/dL (ref 65–99)
HCT: 35 % — ABNORMAL LOW (ref 36.0–46.0)
HEMOGLOBIN: 11.9 g/dL — AB (ref 12.0–15.0)
Potassium: 3.7 mmol/L (ref 3.5–5.1)
Sodium: 140 mmol/L (ref 135–145)
TCO2: 24 mmol/L (ref 0–100)

## 2016-01-30 LAB — URINALYSIS, ROUTINE W REFLEX MICROSCOPIC
BILIRUBIN URINE: NEGATIVE
GLUCOSE, UA: NEGATIVE mg/dL
Ketones, ur: NEGATIVE mg/dL
LEUKOCYTES UA: NEGATIVE
Nitrite: NEGATIVE
PH: 6.5 (ref 5.0–8.0)
Protein, ur: NEGATIVE mg/dL
Specific Gravity, Urine: 1.02 (ref 1.005–1.030)

## 2016-01-30 LAB — I-STAT CG4 LACTIC ACID, ED: LACTIC ACID, VENOUS: 0.84 mmol/L (ref 0.5–2.0)

## 2016-01-30 LAB — URINE MICROSCOPIC-ADD ON

## 2016-01-30 LAB — RAPID URINE DRUG SCREEN, HOSP PERFORMED
AMPHETAMINES: NOT DETECTED
BENZODIAZEPINES: NOT DETECTED
Barbiturates: NOT DETECTED
COCAINE: POSITIVE — AB
OPIATES: NOT DETECTED
TETRAHYDROCANNABINOL: NOT DETECTED

## 2016-01-30 MED ORDER — SODIUM CHLORIDE 0.9 % IV BOLUS (SEPSIS)
1000.0000 mL | Freq: Once | INTRAVENOUS | Status: AC
  Administered 2016-01-30: 1000 mL via INTRAVENOUS

## 2016-01-30 MED ORDER — PROMETHAZINE-DM 6.25-15 MG/5ML PO SYRP: 5.0000 mL | ORAL_SOLUTION | Freq: Four times a day (QID) | ORAL | Status: DC | PRN

## 2016-01-30 MED ORDER — LORAZEPAM 2 MG/ML IJ SOLN
1.0000 mg | Freq: Once | INTRAMUSCULAR | Status: AC
  Administered 2016-01-30: 1 mg via INTRAVENOUS
  Filled 2016-01-30: qty 1

## 2016-01-30 MED ORDER — HYDROMORPHONE HCL 1 MG/ML IJ SOLN
1.0000 mg | Freq: Once | INTRAMUSCULAR | Status: AC
  Administered 2016-01-30: 1 mg via INTRAVENOUS
  Filled 2016-01-30: qty 1

## 2016-01-30 MED ORDER — GADOBENATE DIMEGLUMINE 529 MG/ML IV SOLN
10.0000 mL | Freq: Once | INTRAVENOUS | Status: AC | PRN
  Administered 2016-01-30: 9 mL via INTRAVENOUS

## 2016-01-30 MED ORDER — IBUPROFEN 800 MG PO TABS
800.0000 mg | ORAL_TABLET | Freq: Once | ORAL | Status: AC
  Administered 2016-01-30: 800 mg via ORAL
  Filled 2016-01-30: qty 1

## 2016-01-30 MED ORDER — IBUPROFEN 600 MG PO TABS: 600.0000 mg | ORAL_TABLET | Freq: Four times a day (QID) | ORAL | Status: DC | PRN

## 2016-01-30 NOTE — ED Notes (Signed)
Patient transported to MRI 

## 2016-01-30 NOTE — ED Notes (Signed)
Presents with left sided back pain throughout entire back and goes into left leg and feet associated with numbness and tingling in left leg. Began this AM while lying on couch. Pt is tearful and crying in pain. Temp here 100.6, endorses cocaine abuse, has not used in 2 days. Vasculitis from cocaine abuse.  HR 104-110

## 2016-01-30 NOTE — ED Provider Notes (Signed)
CSN: [id number]     Arrival date & time 01/30/16  6945 History   First MD Initiated Contact with Patient 01/30/16 0636     Chief Complaint  Patient presents with  . 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 Patient report acute onset of pain to her low back which radiates all the way down to her left leg that started at 11 PM last night while she was sitting on the couch. The pain is rated as an achy sensation, constant, 10 out of 10, worsened with movement. Patient is crying and requesting for pain medication due to her persistent pain. Patient states she has never had this kind of pain before. She denies any recent strenuous activities or anything that may have triggered this back pain. She reports numbness and tingling sensation throughout left leg. She also reports fever and chills but attributed to her upper URI symptoms including nasal congestion, sneezing, coughing for the past 2 days. She admits history of cocaine abuse last use 2 days ago. She denies any history of IV drug use and states she only smokes. Patient also denies having bowel bladder incontinence or saddle anesthesia. She denies having weakness to her left leg aside from the pain. No complaints of abdominal pain, dysuria, hematuria, vaginal bleeding or vaginal discharge. Denies chest pain or shortness of breath. Denies any severe headache or neck stiffness. No new rash aside from her chronic vasculitis.  Past Medical History  Diagnosis Date  . Vasculitis ([employer])     2/2 Levimasole toxicity. Followed by Dr. Louanne Skye  . Hypertension   . Cocaine abuse     ongoing with resultant vaculitis.  . Depression   . Normocytic anemia     BL Hgb 9.8-12. Last anemia panel 04/2010 -  showing Fe 19, ferritin 101.  Pt on monthly B12 injections  . 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 CAP (community acquired pneumonia) 03/2014 X 2  . Daily headache   . Migraines     "probably 5-6/yr" (04/21/2014)  . Rheumatoid arthritis(714.0)     patient reported  . Inflammatory arthritis [facility])    Past Surgical History  Procedure Laterality Date  . Skin biopsy Bilateral 04/2010    shin nodules  . Irrigation and debridement abscess Bilateral 09/26/2013    Procedure: DEBRIDEMENT ULCERS BILATERAL THIGHS;  Surgeon: Gwenyth Ober, MD;  Location: [facility];  Service: General;  Laterality: Bilateral;  . Hernia repair      "stomach"  . I&d extremity Right 09/26/2015    Procedure: IRRIGATION AND DEBRIDEMENT LEG WOUND  VAC PLACEMENT.;  Surgeon: Loel Lofty Dillingham, DO;  Location: [place];  Service: Plastics;  Laterality: Right;   Family History  Problem Relation Age of Onset  . Breast cancer Mother     Breast cancer  . Alcohol abuse Mother   . Colon cancer Maternal Aunt 43  . Alcohol abuse Father    Social History  Substance Use Topics  . Smoking status: Current Every Day Smoker -- 0.30 packs/day for 34 years    Types: Cigarettes  . Smokeless tobacco: Never Used     Comment: "Trying very hard". / 4-5 CIGARETTES A DAY  . Alcohol Use: No   OB History    No data available  Review of Systems  All other systems reviewed and are negative.     Allergies  Acetaminophen  Home Medications   Prior to Admission medications   Medication Sig Start Date End Date Taking? Authorizing Provider  Amino Acids-Protein Hydrolys (FEEDING SUPPLEMENT, PRO-STAT SUGAR FREE 64,) LIQD Take 30 mLs by mouth daily. Patient not taking: Reported on 12/13/2015 10/28/15   Jule Ser, DO  feeding supplement (BOOST / RESOURCE BREEZE) LIQD Take 1 Container by mouth 3 (three) times daily between meals. Patient not taking: Reported on 12/13/2015 10/28/15   Jule Ser, DO   ibuprofen (ADVIL,MOTRIN) 600 MG tablet Take 1 tablet (600 mg total) by mouth every 6 (six) hours as needed. 12/13/15   Charlesetta Shanks, MD  meloxicam (MOBIC) 7.5 MG tablet Take 1 tablet (7.5 mg total) by mouth daily. 11/20/15 11/19/16  Corky Sox, MD  Multiple Vitamin (MULTIVITAMIN WITH MINERALS) TABS tablet Take 1 tablet by mouth daily. Patient not taking: Reported on 12/13/2015 10/28/15   Jule Ser, DO  potassium chloride SA (K-DUR,KLOR-CON) 20 MEQ tablet Take 1 tablet (20 mEq total) by mouth 2 (two) times daily. 11/20/15   Corky Sox, MD   There were no vitals taken for this visit. Physical Exam  Constitutional: She appears well-developed and well-nourished. No distress.  Chronically ill appearing African-American female laying in bed crying.  HENT:  Head: Atraumatic.  Chronic vasculitis noticed to both earlobes Ears: TMs mildly erythematous bilaterally without effusion Nose: Rhinorrhea Throat: Uvula is midline no tonsillar enlargement exudates.  Eyes: Conjunctivae are normal.  Neck: Neck supple.  No nuchal rigidity  Cardiovascular:  Mild tachycardia without murmurs rubs gallops  Pulmonary/Chest: Effort normal and breath sounds normal. No respiratory distress. She has no wheezes. She has no rales. She exhibits no tenderness.  Abdominal: Soft.  Musculoskeletal: She exhibits tenderness (Tenderness along the spine at T10-12, LIII-L4 and L5-S1 to palpation without overlying skin changes, crepitus or step-off. Tenderness throughout the entire left leg on gentle palpation without any new rash. +SLR).  Neurological: She is alert. She has normal reflexes.  5/5 strength to bilateral lower extremities. Dorsalis pedis pulse palpable. Patellar deep reflexes intact bilaterally, no foot drop. Sensation is presents to bilateral extremities.  Skin: No rash noted.  Psychiatric: She has a normal mood and affect.  Nursing note and vitals reviewed.   ED Course  Procedures (including critical care  time) Labs Review Labs Reviewed  CBC WITH DIFFERENTIAL/PLATELET - Abnormal; Notable for the following:    Hemoglobin 11.2 (*)    HCT 33.3 (*)    All other components within normal limits  URINALYSIS, ROUTINE W REFLEX MICROSCOPIC (NOT AT [facility]) - Abnormal; Notable for the following:    Hgb urine dipstick SMALL (*)    All other components within normal limits  URINE RAPID DRUG SCREEN, HOSP PERFORMED - Abnormal; Notable for the following:    Cocaine POSITIVE (*)    All other components within normal limits  URINE MICROSCOPIC-ADD ON - Abnormal; Notable for the following:    Squamous Epithelial / LPF 0-5 (*)    Bacteria, UA RARE (*)    All other components within normal limits  I-STAT CHEM 8, ED - Abnormal; Notable for the following:    Calcium, Ion 1.11 (*)    Hemoglobin 11.9 (*)    HCT 35.0 (*)    All other components within normal limits  I-STAT CG4 LACTIC ACID, ED    Imaging Review Dg Chest 2 View  01/30/2016  CLINICAL DATA:  Chest pain.  Hypertension.  Tobacco use. EXAM: CHEST  2 VIEW COMPARISON:  Multiple exams, including 11/14/2015 FINDINGS: Bilateral asymmetric nodular densities projecting over the mid lungs represent nipple shadows and are unchanged from multiple prior exams. Stable scarring, right upper lobe. Atherosclerotic aortic arch. Heart size within normal limits. No pleural effusion. IMPRESSION: 1.  No active cardiopulmonary disease is radiographically apparent. 2. Chronic scarring, right upper lobe. 3. Atherosclerosis. Electronically Signed   By: Van Clines M.D.   On: 01/30/2016 13:49   Mr Cervical Spine Wo Contrast  01/30/2016  CLINICAL DATA:  Left-sided back pain radiating in through the entire back and into the left leg. Low-grade fever. Initial encounter. EXAM: MRI CERVICAL SPINE WITHOUT CONTRAST TECHNIQUE: Multiplanar, multisequence MR imaging of the cervical spine was performed. No intravenous contrast was administered. COMPARISON:  None. FINDINGS: There is  straightening of the normal cervical lordosis. Convex left curvature may be positional. Vertebral body height, signal alignment are maintained. The craniocervical junction is normal. Cervical cord signal is normal. Fluid is seen interposed between the splenius capitis and semispinalis capitis muscles. No edema within the muscles themselves is identified. Paraspinous soft tissue structures are unremarkable. C2-3:  Negative. C3-4: Shallow central protrusion effaces the ventral thecal sac. The foramina are open. C4-5: Shallow broad-based disc bulge effaces the ventral thecal sac. The foramina are open. C5-6: The patient has a disc bulge with a superimposed large left paracentral protrusion with caudal extension. The cord is deformed throughout and the left hemi cord is severely flattened. There is moderate left foraminal narrowing. The right foramen is open. C6-7: Shallow disc bulge and uncovertebral disease are seen. There is mild for the ventral cord. Moderate to moderately severe bilateral foraminal narrowing is identified. C7-T1: Negative. IMPRESSION: Fluid interposed between posterior paraspinous musculature on the right is nonspecific. It could be secondary to strain, dependent change or less likely infectious or inflammatory process. No drainable collection is present. No convincing evidence of myositis is identified and there is no evidence of discitis or osteomyelitis. Disc bulge with a superimposed very large left there paracentral protrusion with caudal extension at C5-6 results in severe flattening of the left hemi cord. Disc bulge at C6-7 effaces the ventral thecal sac. Moderate to moderately severe foraminal narrowing at this level appears somewhat worse on the left. Disc bulge at C4-5 effaces the ventral thecal sac. Electronically Signed   By: Inge Rise M.D.   On: 01/30/2016 12:29   Mr Thoracic Spine W Wo Contrast  01/30/2016  CLINICAL DATA:  Left side low back pain radiating throughout the  back and into the left leg and foot with numbness and tingling in the left leg. Symptoms began this morning. No known injury. Initial encounter. EXAM: MRI THORACIC SPINE WITHOUT AND WITH CONTRAST TECHNIQUE: Multiplanar and multiecho pulse sequences of the thoracic spine were obtained without and with intravenous contrast. CONTRAST:  9 ml MULTIHANCE GADOBENATE DIMEGLUMINE 529 MG/ML IV SOLN COMPARISON:  None. FINDINGS: Scout imaging includes a wide field-of-view sagittal T1 weighted sequence of the cervical spine. The patient has a very large disc protrusion at C5-6 which appears to be central and to the left. There is also a disc bulge at C6-7. Scout imaging is otherwise unremarkable. Vertebral body height, signal and alignment are maintained throughout the thoracic spine. The thoracic cord demonstrates normal signal throughout. No pathologic enhancement after contrast administration is identified. The central spinal canal neural foramina are widely patent at all levels. Intervertebral disc spaces are unremarkable. Imaged paraspinous structures  appear normal. IMPRESSION: Dominant finding is on scout imaging where a very large central and eccentric to the left disc protrusion at C5-6 is seen but incompletely evaluated. Cervical spine MRI without contrast could be used for further evaluation. Negative thoracic spine. Electronically Signed   By: Inge Rise M.D.   On: 01/30/2016 10:23   Mr Lumbar Spine W Wo Contrast  01/30/2016  CLINICAL DATA:  Left side low back pain radiating throughout the back and into the left leg and foot with numbness and tingling in the left leg. Symptoms began this morning. No known injury. Initial encounter. EXAM: MRI LUMBAR SPINE WITHOUT AND WITH CONTRAST TECHNIQUE: Multiplanar and multiecho pulse sequences of the lumbar spine were obtained without and with intravenous contrast. CONTRAST:  61m MULTIHANCE GADOBENATE DIMEGLUMINE 529 MG/ML IV SOLN COMPARISON:  CT abdomen and pelvis  10/12/2010 reviewed. FINDINGS: Vertebral body height, signal and alignment are normal. No pathologic enhancement after contrast administration is identified. The conus medullaris is normal in signal and position. Imaged paraspinous structures are unremarkable. L4-5: There is a small annular fissure and very shallow left foraminal protrusion without foraminal stenosis. The central canal and right foramen are widely patent. Except as described above, intervertebral disc spaces are unremarkable. IMPRESSION: No finding to explain the patient's symptoms. Annular fissure and very small left foraminal protrusion L4-5 without foraminal stenosis. The exam is otherwise negative. Electronically Signed   By: TInge RiseM.D.   On: 01/30/2016 10:13   I have personally reviewed and evaluated these images and lab results as part of my medical decision-making.   EKG Interpretation None      MDM   Final diagnoses:  Acute radicular low back pain  Viral respiratory infection    BP 157/101 mmHg  Pulse 102  Temp(Src) 102 F (38.9 C) (Oral)  Resp 18  SpO2 98%   7:14 AM Patient here with acute onset of radicular low back pain radiates down to her left leg.  symptom is suggestive of sciatica. She is however have a low-grade temperature of 100.6. She does have some URI symptoms but due to her history of polysubstance abuse, I'm concerned for potential epidural abscess even though patient denies history of IV drug use. Pain medication given, workup initiated, will obtain MRI to rule out infectious spinal process. Care discussed with Dr. NAlfonse Spruce    10:28 AM Tspine MRI showing a very Paracentral protrusion central and eccentric to the left disc protrusion at C5-C6 but incompletely evaluated, Cspine MRI w/o contrast could be use to further evaluate.  Lspine MRI is without acute finding.  Pt continues to endorse significant back pain, not well control with pain medication given so far.  She does not have any  significant neck pain on exam.  Plan to obtain Cspine MRI during this hospital visit.  Pain medication given.  Her labs are otherwise at baseline for her.    12:47 PM Cervical spine MRI showing nonspecific fluid in the right posterior paraspinous musculature which may be related to strain, dependent change and less likely to be infectious or inflammatory process. There is also a disc bulge large paracentral protrusion with caudal extension at the C5-C6 result severe flattening of the left hemicord. Disc bulge at C6-7 as well as C4-C5. On repeat examination patient does have tenderness throughout her entire spine without focal point tenderness at this time. Plan to consult on your surgeon for guidance with the MRI results.  1:41 PM Appreciate consultation from neurosurgeon Dr. JArnoldo Morale  He felt  the finding of her MRI is not likely to support epidural abscess given that her initial complaint is low back pain radiates to her L leg without MRI finding to support such.  Pt on exam does not have L arm pain or weakness.  Dr. Arnoldo Morale recommend pt to f/u with her medical doctor.  He also recommend pt to f/u in office if she develops L arm pain.    2:23 PM Blood pressure improves to 157/101. Still maintain a temperature of 102 after receiving ibuprofen. Suspect source as URI.  UA shows no signs of urine tract infection. Chest x-ray is unremarkable and no signs of pneumonia. Normal WBC.  Normal lactic acid.  3:18 PM Patient felt better after treatment. She will be provided with medication to treat for her URI. Ibuprofen for back pain and fever. She will follow-up with her PCP for further management of her condition. She understands to follow-up with Dr. Arnoldo Morale office if she developed left arm pain. Return percussion discussed. I encouraged patient to avoid using cocaine and she agrees. She will also need to have a blood pressure recheck by her PCP as well. Pt is able to ambulate.   Domenic Moras, PA-C 01/30/16  1520  Harvel Quale, MD 02/13/16 [phone]

## 2016-01-30 NOTE — ED Notes (Signed)
Returned from MRI 

## 2016-01-30 NOTE — ED Notes (Signed)
PA aware of temp. Awaiting to here from neuro before fever addressed.

## 2016-01-30 NOTE — Discharge Instructions (Signed)
Radicular Pain Radicular pain in either the arm or leg is usually from a bulging or herniated disk in the spine. A piece of the herniated disk may press against the nerves as the nerves exit the spine. This causes pain which is felt at the tips of the nerves down the arm or leg. Other causes of radicular pain may include:  Fractures.  Heart disease.  Cancer.  An abnormal and usually degenerative state of the nervous system or nerves (neuropathy). Diagnosis may require CT or MRI scanning to determine the primary cause.  Nerves that start at the neck (nerve roots) may cause radicular pain in the outer shoulder and arm. It can spread down to the thumb and fingers. The symptoms vary depending on which nerve root has been affected. In most cases radicular pain improves with conservative treatment. Neck problems may require physical therapy, a neck collar, or cervical traction. Treatment may take many weeks, and surgery may be considered if the symptoms do not improve.  Conservative treatment is also recommended for sciatica. Sciatica causes pain to radiate from the lower back or buttock area down the leg into the foot. Often there is a history of back problems. Most patients with sciatica are better after 2 to 4 weeks of rest and other supportive care. Short term bed rest can reduce the disk pressure considerably. Sitting, however, is not a good position since this increases the pressure on the disk. You should avoid bending, lifting, and all other activities which make the problem worse. Traction can be used in severe cases. Surgery is usually reserved for patients who do not improve within the first months of treatment. Only take over-the-counter or prescription medicines for pain, discomfort, or fever as directed by your caregiver. Narcotics and muscle relaxants may help by relieving more severe pain and spasm and by providing mild sedation. Cold or massage can give significant relief. Spinal manipulation  is not recommended. It can increase the degree of disc protrusion. Epidural steroid injections are often effective treatment for radicular pain. These injections deliver medicine to the spinal nerve in the space between the protective covering of the spinal cord and back bones (vertebrae). Your caregiver can give you more information about steroid injections. These injections are most effective when given within two weeks of the onset of pain.  You should see your caregiver for follow up care as recommended. A program for neck and back injury rehabilitation with stretching and strengthening exercises is an important part of management.  SEEK IMMEDIATE MEDICAL CARE IF:  You develop increased pain, weakness, or numbness in your arm or leg.  You develop difficulty with bladder or bowel control.  You develop abdominal pain.   This information is not intended to replace advice given to you by your health care provider. Make sure you discuss any questions you have with your health care provider.   Document Released: 10/29/2004 Document Revised: 10/12/2014 Document Reviewed: 04/17/2015 Elsevier Interactive Patient Education 2016 [employer]  Viral Infections A viral infection can be caused by different types of viruses.Most viral infections are not serious and resolve on their own. However, some infections may cause severe symptoms and may lead to further complications. SYMPTOMS Viruses can frequently cause:  Minor sore throat.  Aches and pains.  Headaches.  Runny nose.  Different types of rashes.  Watery eyes.  Tiredness.  Cough.  Loss of appetite.  Gastrointestinal infections, resulting in nausea, vomiting, and diarrhea. These symptoms do not respond to antibiotics because the infection  is not caused by bacteria. However, you might catch a bacterial infection following the viral infection. This is sometimes called a "superinfection." Symptoms of such a bacterial infection may  include:  Worsening sore throat with pus and difficulty swallowing.  Swollen neck glands.  Chills and a high or persistent fever.  Severe headache.  Tenderness over the sinuses.  Persistent overall ill feeling (malaise), muscle aches, and tiredness (fatigue).  Persistent cough.  Yellow, green, or brown mucus production with coughing. HOME CARE INSTRUCTIONS   Only take over-the-counter or prescription medicines for pain, discomfort, diarrhea, or fever as directed by your caregiver.  Drink enough water and fluids to keep your urine clear or pale yellow. Sports drinks can provide valuable electrolytes, sugars, and hydration.  Get plenty of rest and maintain proper nutrition. Soups and broths with crackers or rice are fine. SEEK IMMEDIATE MEDICAL CARE IF:   You have severe headaches, shortness of breath, chest pain, neck pain, or an unusual rash.  You have uncontrolled vomiting, diarrhea, or you are unable to keep down fluids.  You or your child has an oral temperature above 102 F (38.9 C), not controlled by medicine.  Your baby is older than 3 months with a rectal temperature of 102 F (38.9 C) or higher.  Your baby is 47 months old or younger with a rectal temperature of 100.4 F (38 C) or higher. MAKE SURE YOU:   Understand these instructions.  Will watch your condition.  Will get help right away if you are not doing well or get worse.   This information is not intended to replace advice given to you by your health care provider. Make sure you discuss any questions you have with your health care provider.   Document Released: 07/01/2005 Document Revised: 12/14/2011 Document Reviewed: 02/27/2015 Elsevier Interactive Patient Education [employer].

## 2016-01-30 NOTE — ED Notes (Signed)
Pt taken to XRay 

## 2016-01-30 NOTE — ED Notes (Signed)
Remains in MRI 

## 2016-01-30 NOTE — ED Notes (Signed)
Returned from MRI. Reports increasing back pain again. Crying on return to room

## 2016-02-01 IMAGING — CR DG CHEST 2V
2 series · 2 of 2 positions shown · non-contrast
Comparison: DG CHEST 1V PORT dated 05/13/2013; DG CHEST 2 VIEW dated
02/18/2013

CLINICAL DATA: Chest pain

EXAM:
CHEST  2 VIEW

[w chest lat]
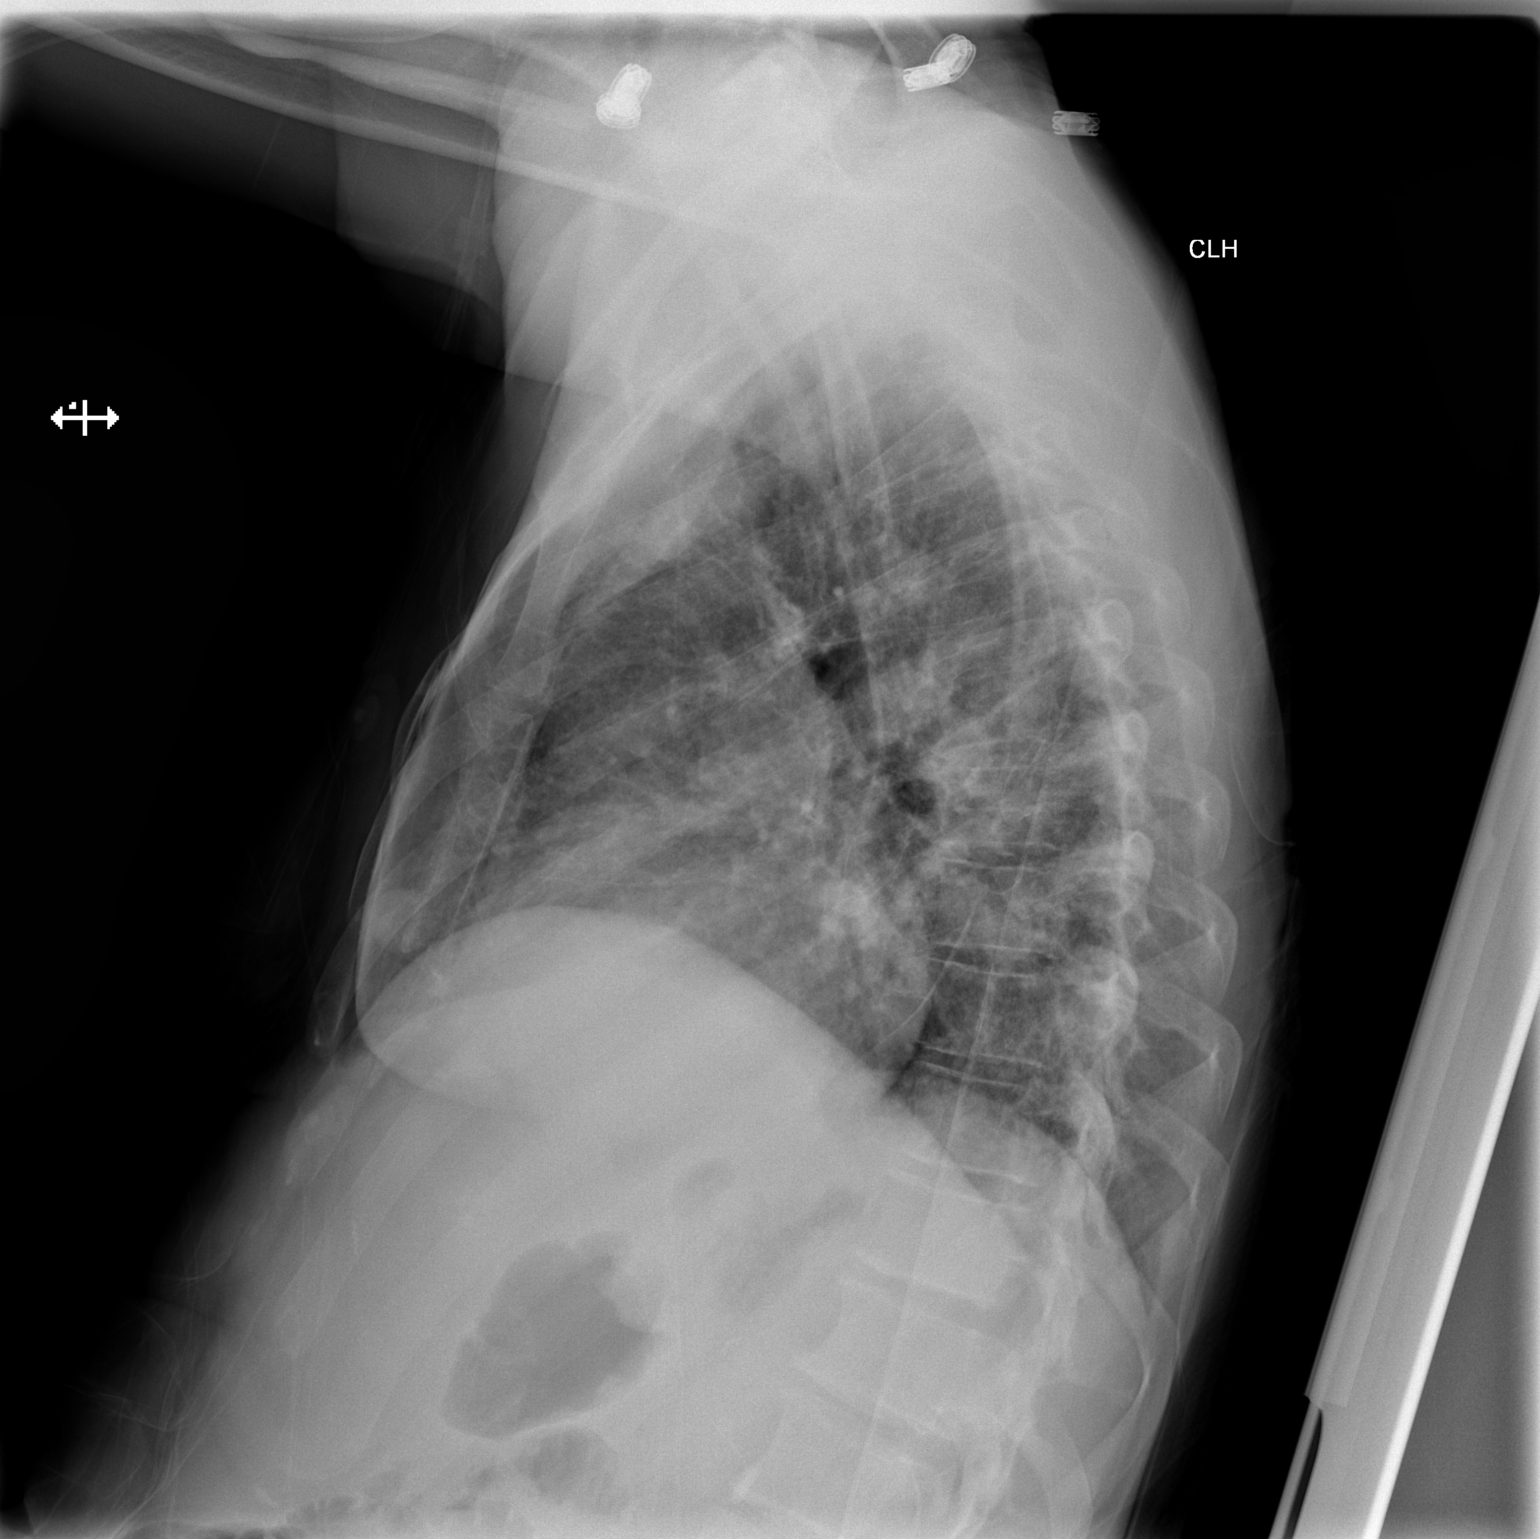

[x chest ap]
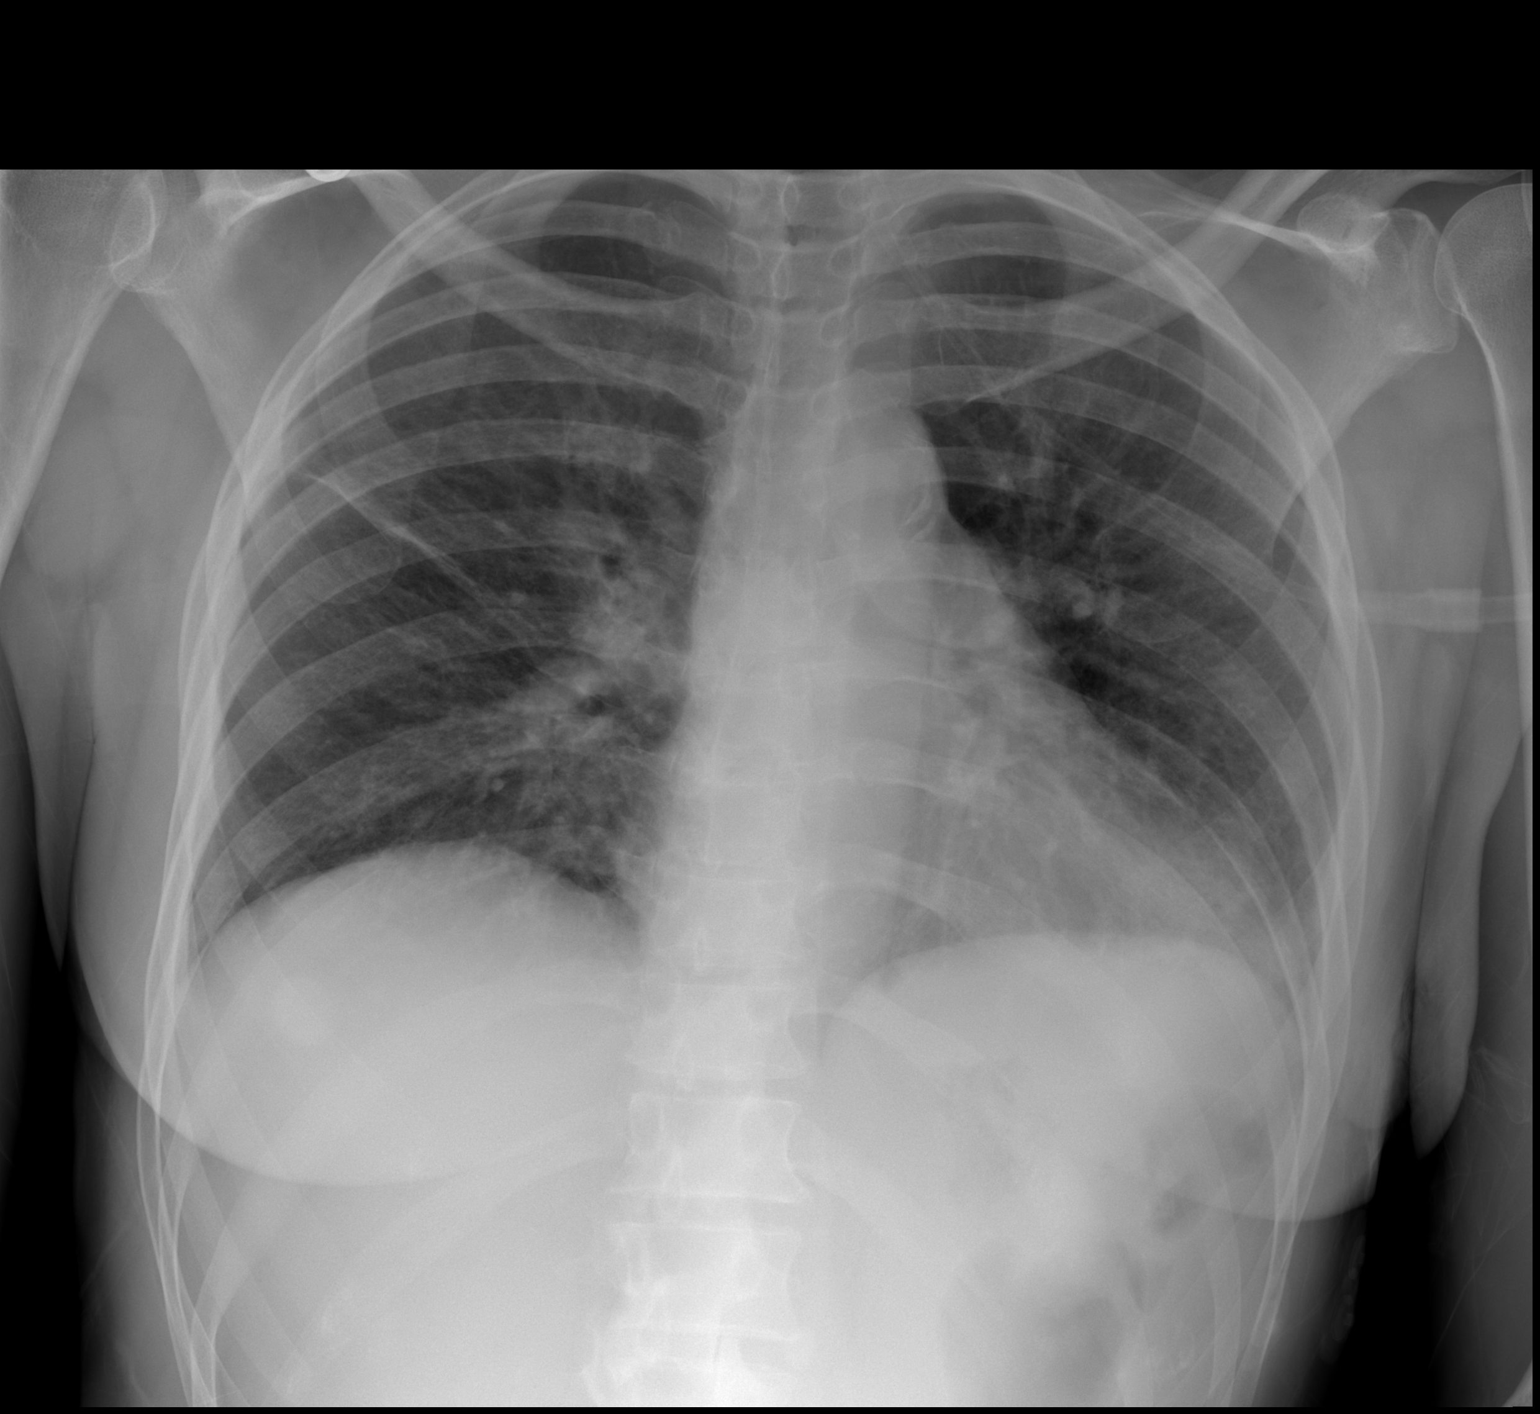

[2 of 2 positions shown; findings below may reference images not displayed]

FINDINGS: Low lung volumes are present, causing crowding of the pulmonary
vasculature. Abnormal airspace opacity at the left lung base best
appreciated on the frontal projection. Equivocal right perihilar
airspace opacity. Stable right upper lobe scarring.

Atherosclerotic aortic arch.
IMPRESSION: 1. Airspace opacity at the left lung base and potentially in the
right perihilar region, suspicious for pneumonia.
2. Low lung volumes.

## 2016-02-06 ENCOUNTER — Emergency Department (HOSPITAL_COMMUNITY)
Admission: EM | Admit: 2016-02-06 | Discharge: 2016-02-06 | Disposition: A | Discharge status: Home / Self Care | Payer: Medicaid Other | Attending: Emergency Medicine | Admitting: Emergency Medicine

## 2016-02-06 ENCOUNTER — Encounter (HOSPITAL_COMMUNITY): Payer: Self-pay

## 2016-02-06 DIAGNOSIS — I10 Essential (primary) hypertension: Secondary | ICD-10-CM | POA: Insufficient documentation

## 2016-02-06 DIAGNOSIS — H9203 Otalgia, bilateral: Secondary | ICD-10-CM | POA: Diagnosis present

## 2016-02-06 DIAGNOSIS — Z8701 Personal history of pneumonia (recurrent): Secondary | ICD-10-CM | POA: Diagnosis not present

## 2016-02-06 DIAGNOSIS — Z8659 Personal history of other mental and behavioral disorders: Secondary | ICD-10-CM | POA: Insufficient documentation

## 2016-02-06 DIAGNOSIS — M109 Gout, unspecified: Secondary | ICD-10-CM | POA: Insufficient documentation

## 2016-02-06 DIAGNOSIS — F17210 Nicotine dependence, cigarettes, uncomplicated: Secondary | ICD-10-CM | POA: Insufficient documentation

## 2016-02-06 DIAGNOSIS — Z79899 Other long term (current) drug therapy: Secondary | ICD-10-CM | POA: Diagnosis not present

## 2016-02-06 DIAGNOSIS — L98491 Non-pressure chronic ulcer of skin of other sites limited to breakdown of skin: Secondary | ICD-10-CM | POA: Insufficient documentation

## 2016-02-06 DIAGNOSIS — Z862 Personal history of diseases of the blood and blood-forming organs and certain disorders involving the immune mechanism: Secondary | ICD-10-CM | POA: Insufficient documentation

## 2016-02-06 LAB — BASIC METABOLIC PANEL
Anion gap: 11 (ref 5–15)
BUN: 11 mg/dL (ref 6–20)
CALCIUM: 8.9 mg/dL (ref 8.9–10.3)
CO2: 25 mmol/L (ref 22–32)
CREATININE: 0.67 mg/dL (ref 0.44–1.00)
Chloride: 103 mmol/L (ref 101–111)
GFR calc Af Amer: >60 mL/min (ref >60)
GLUCOSE: 98 mg/dL (ref 65–99)
Potassium: 3.7 mmol/L (ref 3.5–5.1)
Sodium: 139 mmol/L (ref 135–145)

## 2016-02-06 LAB — CBC WITH DIFFERENTIAL/PLATELET
BASOS ABS: 0 10*3/uL (ref 0.0–0.1)
Basophils Relative: 0 %
EOS ABS: 0.2 10*3/uL (ref 0.0–0.7)
Eosinophils Relative: 4 %
HEMATOCRIT: 35.7 % — AB (ref 36.0–46.0)
Hemoglobin: 12 g/dL (ref 12.0–15.0)
LYMPHS ABS: 1.2 10*3/uL (ref 0.7–4.0)
Lymphocytes Relative: 29 %
MCH: 28.2 pg (ref 26.0–34.0)
MCHC: 33.6 g/dL (ref 30.0–36.0)
MCV: 83.8 fL (ref 78.0–100.0)
Monocytes Absolute: 0.2 10*3/uL (ref 0.1–1.0)
Monocytes Relative: 4 %
NEUTROS PCT: 63 %
Neutro Abs: 2.7 10*3/uL (ref 1.7–7.7)
PLATELETS: 495 10*3/uL — AB (ref 150–400)
RBC: 4.26 MIL/uL (ref 3.87–5.11)
RDW: 13.7 % (ref 11.5–15.5)
WBC: 4.3 10*3/uL (ref 4.0–10.5)

## 2016-02-06 LAB — I-STAT CG4 LACTIC ACID, ED: Lactic Acid, Venous: 0.72 mmol/L (ref 0.5–2.0)

## 2016-02-06 MED ORDER — BACITRACIN ZINC 500 UNIT/GM EX OINT
TOPICAL_OINTMENT | Freq: Two times a day (BID) | CUTANEOUS | Status: DC
  Filled 2016-02-06: qty 1.8

## 2016-02-06 MED ORDER — IBUPROFEN 400 MG PO TABS
600.0000 mg | ORAL_TABLET | Freq: Once | ORAL | Status: AC
  Administered 2016-02-06: 600 mg via ORAL
  Filled 2016-02-06: qty 1

## 2016-02-06 NOTE — ED Provider Notes (Signed)
CSN: [id number]     Arrival date & time 02/06/16  1200 History   First MD Initiated Contact with Patient 02/06/16 1529     Chief Complaint  Patient presents with  . ear pain       HPI Patient presents to the emergency department complains of bilateral ear pain and discomfort.  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  She previously was managed at a wound care center in [place] however she is unable to go there anymore secondary to transportation issues.  In the past we have attempted to get her to the [facility] wound care center on [address] however she has had multiple no shows and therefore is not allowed to go back there.  She reports that she previously was having home health assist with chronic wound care management and dressing changes but her team stopped coming to her house 2 weeks ago.  She denies fever in the past week.  She denies chest pain or abdominal pain.  Denies nausea vomiting.  Reports the pain in her bilateral ears is moderate to severe in severity worse with palpation.  She denies spreading erythema around her ears.    Past Medical History  Diagnosis Date  . Vasculitis ([place])     2/2 Levimasole toxicity. Followed by Dr. Louanne Skye  . Hypertension   . Cocaine abuse     ongoing with resultant vaculitis.  . Depression   . Normocytic anemia     BL Hgb 9.8-12. Last anemia panel 04/2010 - showing Fe 19, ferritin 101.  Pt on monthly B12 injections  . 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 CAP (community acquired pneumonia) 03/2014 X 2  . Daily headache   . Migraines     "probably 5-6/yr" (04/21/2014)  . Rheumatoid arthritis(714.0)     patient reported  . Inflammatory arthritis [facility])    Past Surgical History  Procedure Laterality Date  . Skin biopsy Bilateral 04/2010    shin nodules  . Irrigation and  debridement abscess Bilateral 09/26/2013    Procedure: DEBRIDEMENT ULCERS BILATERAL THIGHS;  Surgeon: Gwenyth Ober, MD;  Location: [place];  Service: General;  Laterality: Bilateral;  . Hernia repair      "stomach"  . I&d extremity Right 09/26/2015    Procedure: IRRIGATION AND DEBRIDEMENT LEG WOUND  VAC PLACEMENT.;  Surgeon: Loel Lofty Dillingham, DO;  Location: [place];  Service: Plastics;  Laterality: Right;   Family History  Problem Relation Age of Onset  . Breast cancer Mother     Breast cancer  . Alcohol abuse Mother   . Colon cancer Maternal Aunt 34  . Alcohol abuse Father    Social History  Substance Use Topics  . Smoking status: Current Every Day Smoker -- 0.30 packs/day for 34 years    Types: Cigarettes  . Smokeless tobacco: Never Used     Comment: "Trying very hard". / 4-5 CIGARETTES A DAY  . Alcohol Use: No   OB History    No data available     Review of Systems  All other systems reviewed and are negative.     Allergies  Acetaminophen  Home Medications   Prior to Admission medications   Medication Sig Start Date End Date Taking? Authorizing Provider  ibuprofen (ADVIL,MOTRIN) 600 MG tablet Take 1 tablet (600 mg  total) by mouth every 6 (six) hours as needed. 01/30/16  Yes Domenic Moras, PA-C  Amino Acids-Protein Hydrolys (FEEDING SUPPLEMENT, PRO-STAT SUGAR FREE 64,) LIQD Take 30 mLs by mouth daily. Patient not taking: Reported on 12/13/2015 10/28/15   Jule Ser, DO  feeding supplement (BOOST / RESOURCE BREEZE) LIQD Take 1 Container by mouth 3 (three) times daily between meals. Patient not taking: Reported on 12/13/2015 10/28/15   Jule Ser, DO  meloxicam (MOBIC) 7.5 MG tablet Take 1 tablet (7.5 mg total) by mouth daily. Patient not taking: Reported on 01/30/2016 11/20/15 11/19/16  Corky Sox, MD  Multiple Vitamin (MULTIVITAMIN WITH MINERALS) TABS tablet Take 1 tablet by mouth daily. Patient not taking: Reported on 12/13/2015 10/28/15   Jule Ser, DO   potassium chloride SA (K-DUR,KLOR-CON) 20 MEQ tablet Take 1 tablet (20 mEq total) by mouth 2 (two) times daily. Patient not taking: Reported on 01/30/2016 11/20/15   Corky Sox, MD  promethazine-dextromethorphan (PROMETHAZINE-DM) 6.25-15 MG/5ML syrup Take 5 mLs by mouth 4 (four) times daily as needed for cough. 01/30/16   Domenic Moras, PA-C   BP 198/92 mmHg  Pulse 103  Temp(Src) 98.6 F (37 C) (Oral)  Resp 20  SpO2 97% Physical Exam  Constitutional: She is oriented to person, place, and time. She appears well-developed and well-nourished.  HENT:  Head: Normocephalic.  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  There is no spreading redness.  There is no purulent drainage.  There is no active bleeding at this time.  Eyes: EOM are normal.  Neck: Normal range of motion.  Pulmonary/Chest: Effort normal.  Abdominal: She exhibits no distension.  Musculoskeletal: Normal range of motion.  Neurological: She is alert and oriented to person, place, and time.  Psychiatric: She has a normal mood and affect.  Nursing note and vitals reviewed.   ED Course  Procedures (including critical care time) Labs Review Labs Reviewed  CBC WITH DIFFERENTIAL/PLATELET - Abnormal; Notable for the following:    HCT 35.7 (*)    Platelets 495 (*)    All other components within normal limits  BASIC METABOLIC PANEL  I-STAT CG4 LACTIC ACID, ED    Imaging Review No results found. I have personally reviewed and evaluated these images and lab results as part of my medical decision-making.   EKG Interpretation None      MDM   Final diagnoses:  Skin ulcer, limited to breakdown of skin ([place])    No indication for an Biaxin this time.  Ibuprofen for her discomfort and pain.  I asked the nurse to apply bacitracin to her open chronic wounds.  I spoke with case management who attempted to schedule her an appointment at the [facility] wound care center who  refused to see her back secondary to multiple no shows.  We were able to complete necessary paperwork for home health to provide wound care and dressing changes at home.  This will be performed through [facility].  Please see case management consultation note for complete details.    Jola Schmidt, MD 02/06/16 [phone]

## 2016-02-06 NOTE — Progress Notes (Signed)
[facility] faxed home health orders to [facility] with confirmation of receipt.

## 2016-02-06 NOTE — Care Management Note (Signed)
Case Management Note  Patient Details  Name: [name] MRN: [id number] Date of Birth: April 25, 1961  Subjective/Objective:                  55 yo Patient here with ongoing ear pain and axillary pain. Long history of vasculitis and continues to use cocaine with last use yesterday.// Home alone.  Action/Plan: Follow for disposition needs.   Expected Discharge Date:       02/06/16           Expected Discharge Plan:  [place]  In-House Referral:  NA  Discharge planning Services  CM Consult  Post Acute Care Choice:  Home Health, Resumption of Svcs/PTA Provider Choice offered to:  Patient  DME Arranged:  N/A DME Agency:  NA  HH Arranged:  RN, Nurse's Aide, Social Work [employer] Agency:  [facility]  Status of Service:  Completed, signed off  Medicare Important Message Given:    Date Medicare IM Given:    Medicare IM give by:    Date Additional Medicare IM Given:    Additional Medicare Important Message give by:     If discussed at [place] of Stay Meetings, dates discussed:    Additional Comments: CM spoke with patient concerning discharge planning. Pt offered choice for [facility] for [facility] services upon discharge. Per pt choice [facility] to provide [facility] services as she has had them in the past.  [facility] rep, Loma Sousa  contacted concerning new referral. Pt to discharge home today.      Fuller Mandril, RN 02/06/2016, 4:05 PM

## 2016-02-06 NOTE — ED Notes (Signed)
Patient here with ongoing ear pain and axillary pain. Long history of vasculitis and continues to use cocaine with last use yesterday.crying on arrival

## 2016-02-19 IMAGING — US US EXTREM UP *R* LTD
1 series · 11 of 11 positions shown · non-contrast
Comparison: Right hand radiograph 06/14/2015

CLINICAL DATA: Palpable area on the second metatarsal of the right
hand for a week. To rule out abscess.

EXAM:
ULTRASOUND right UPPER EXTREMITY LIMITED
TECHNIQUE: Ultrasound examination of the upper extremity soft tissues was
performed in the area of clinical concern.

[Series 1: us extrem up *right* ltd · 0.04mm/px · 11 of 11 slices shown]
[im 1/11]
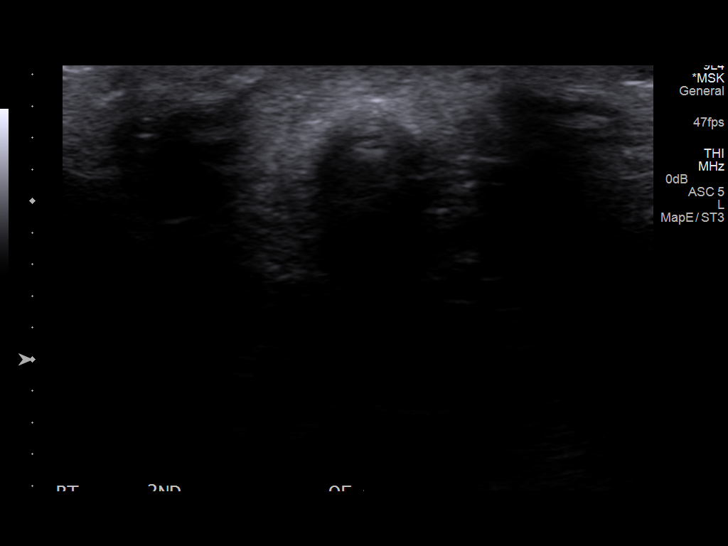
[im 2/11]
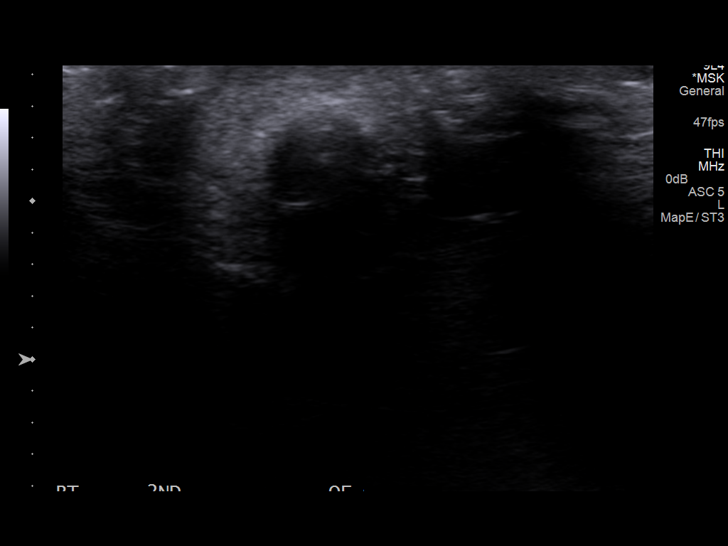
[im 3/11]
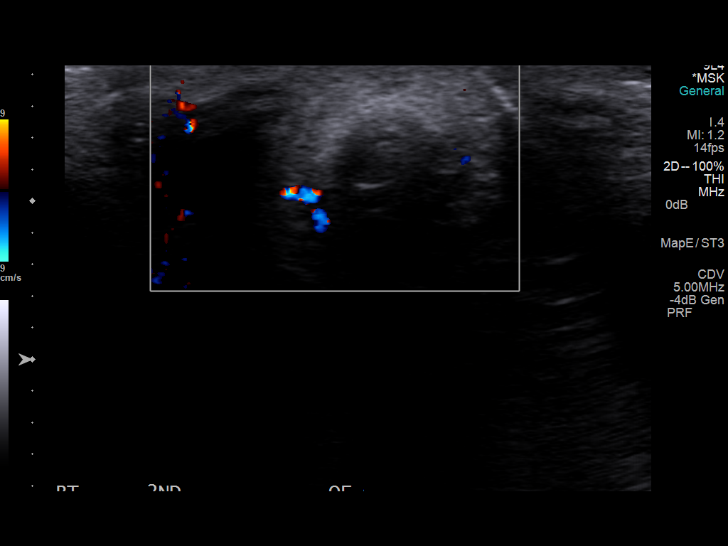
[im 4/11]
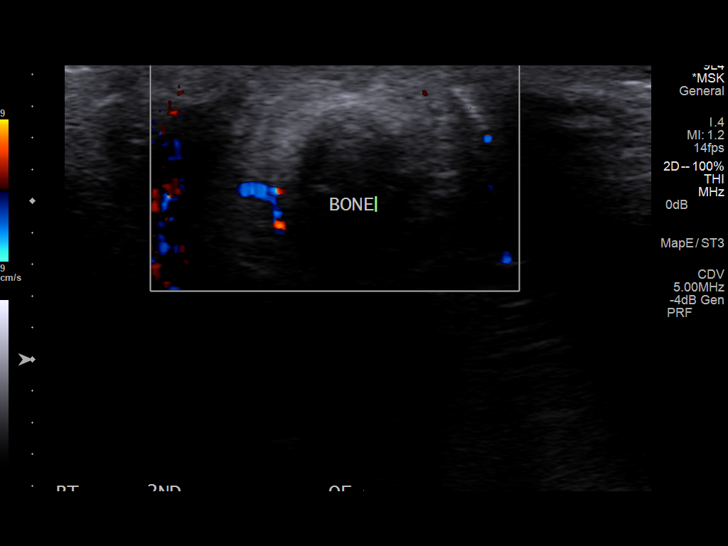
[im 5/11]
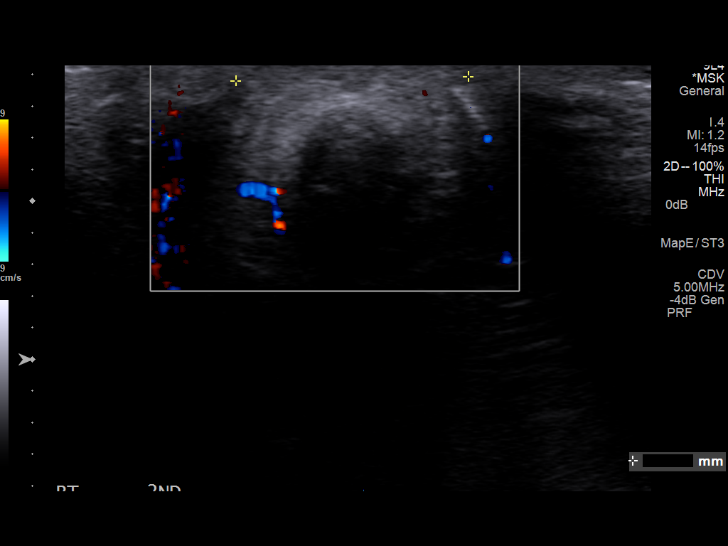
[im 6/11]
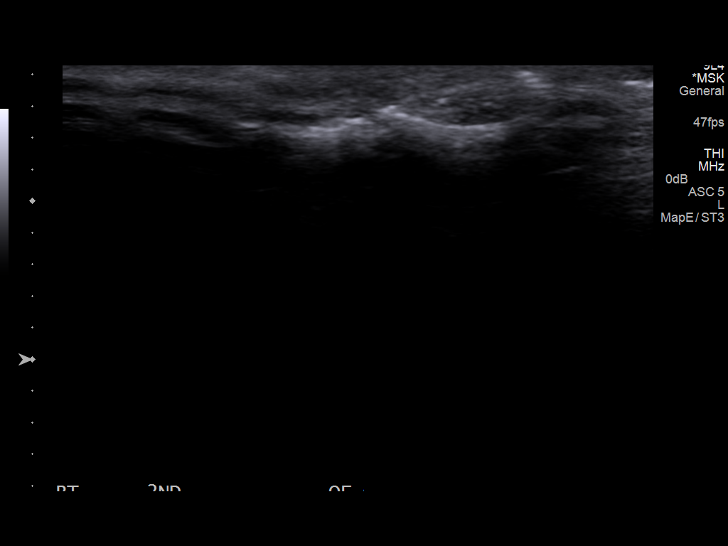
[im 7/11]
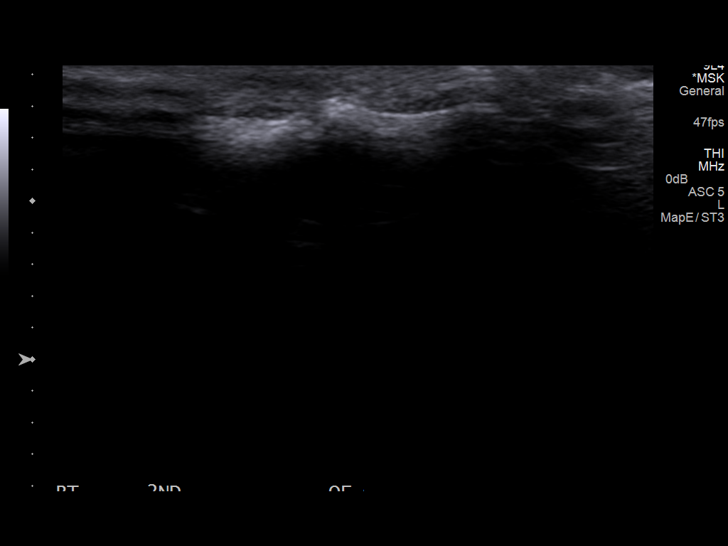
[im 8/11]
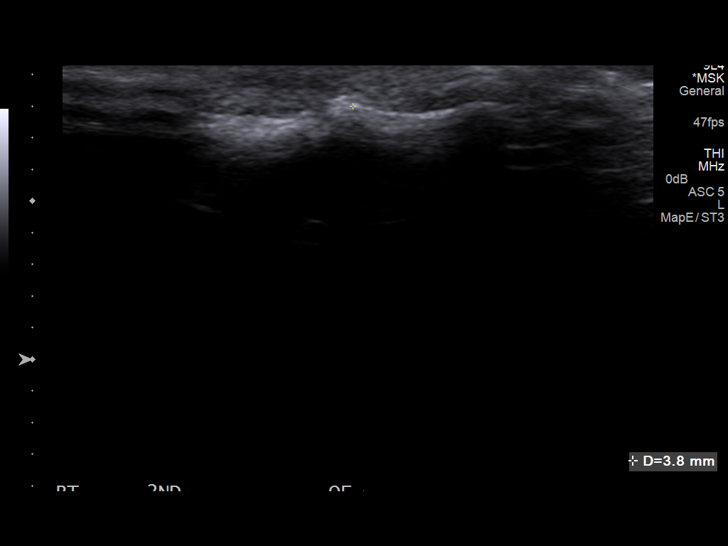
[im 9/11]
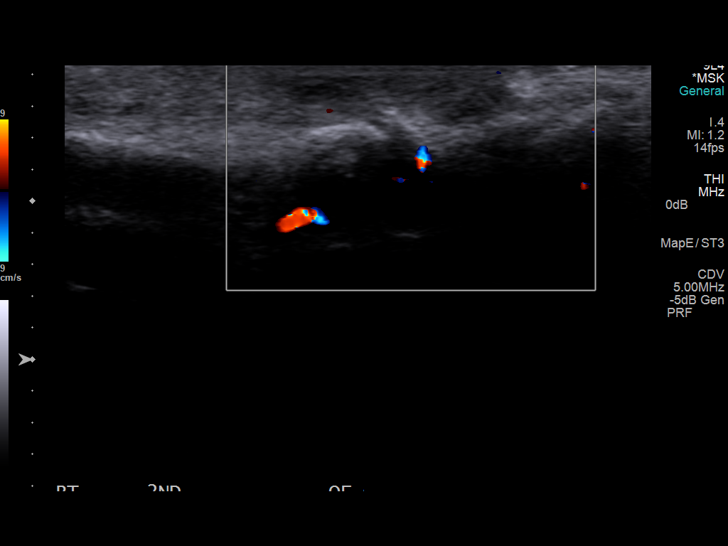
[im 10/11]
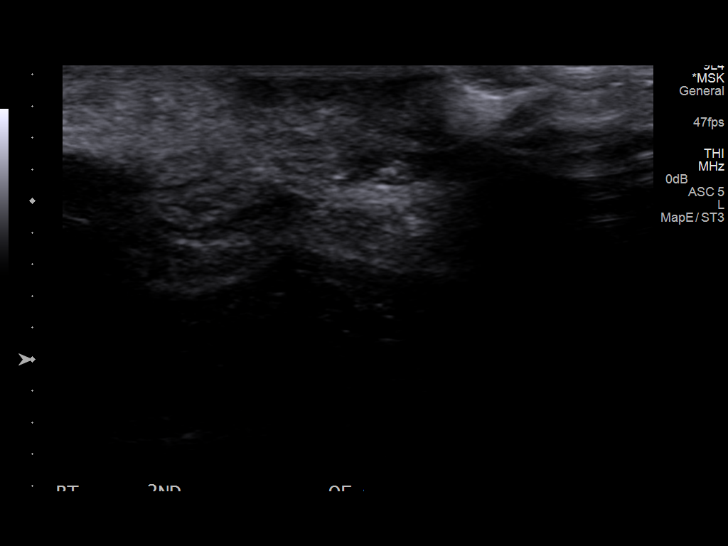
[im 11/11]
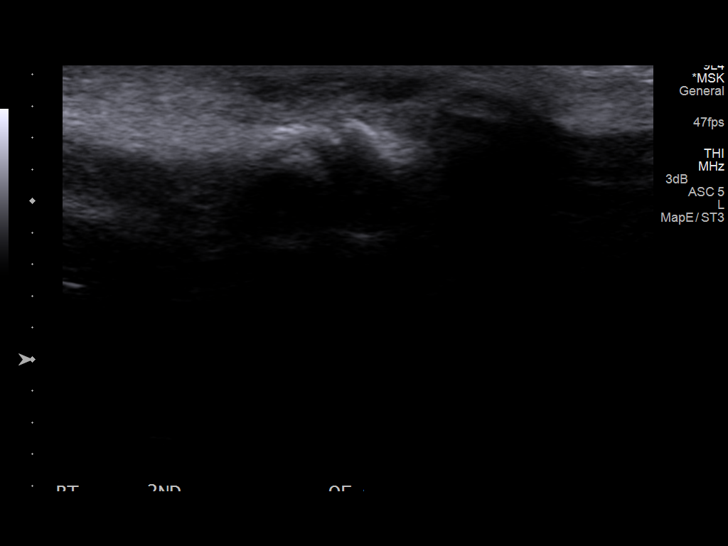

[11 of 11 positions shown; findings below may reference images not displayed]

FINDINGS: Ultrasound images obtained of the area of palpable swelling in the
right second finger. Soft tissue edema is demonstrated. No discrete
fluid collection is identified. No findings to suggest abscess. No
abnormal increased flow on color flow Doppler imaging.
IMPRESSION: No soft tissue abscess identified.

## 2016-03-12 IMAGING — CR DG CHEST 2V
2 series · 2 of 2 positions shown · non-contrast
Comparison: Two-view chest 02/17/2014.

CLINICAL DATA: chest pain

EXAM:
CHEST  2 VIEW

[w chest pa]
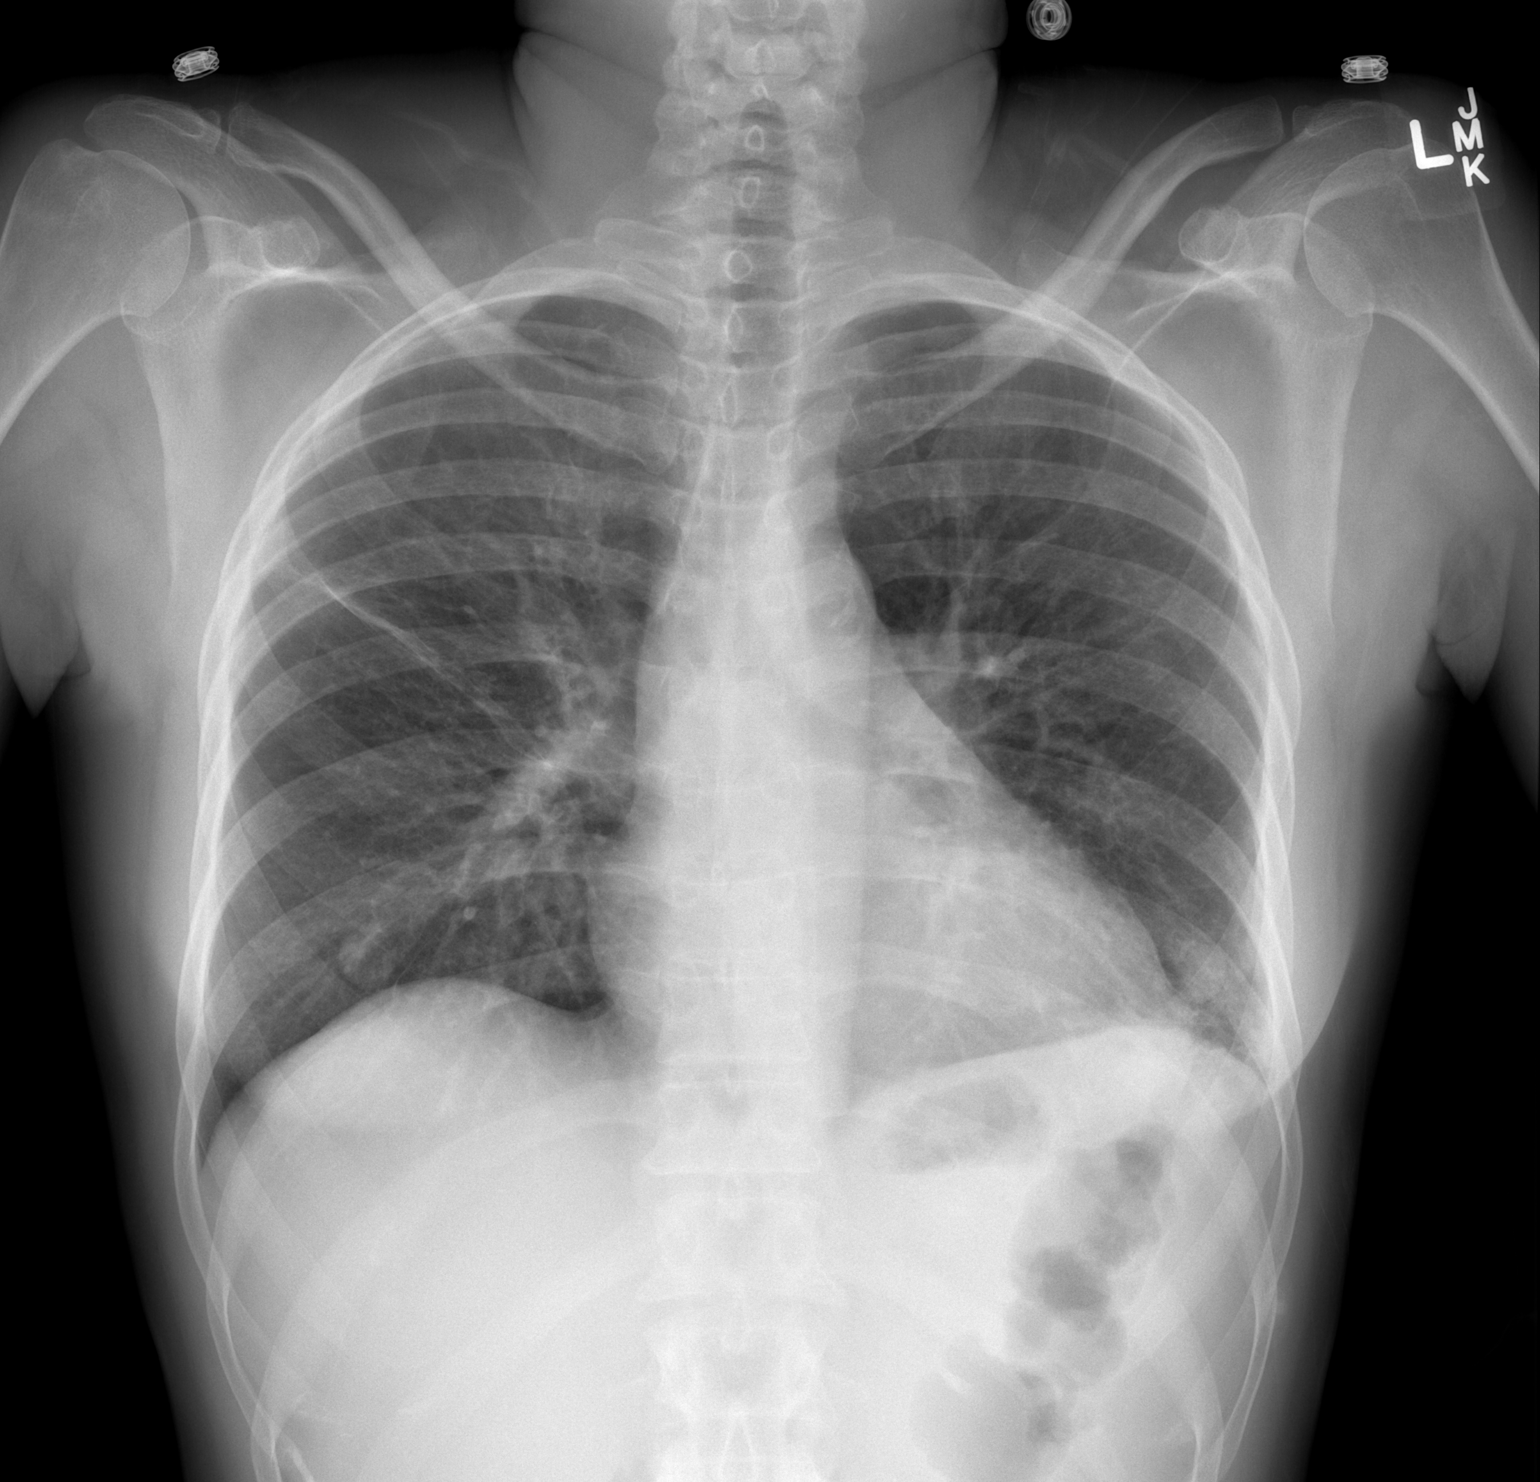

[w chest lat]
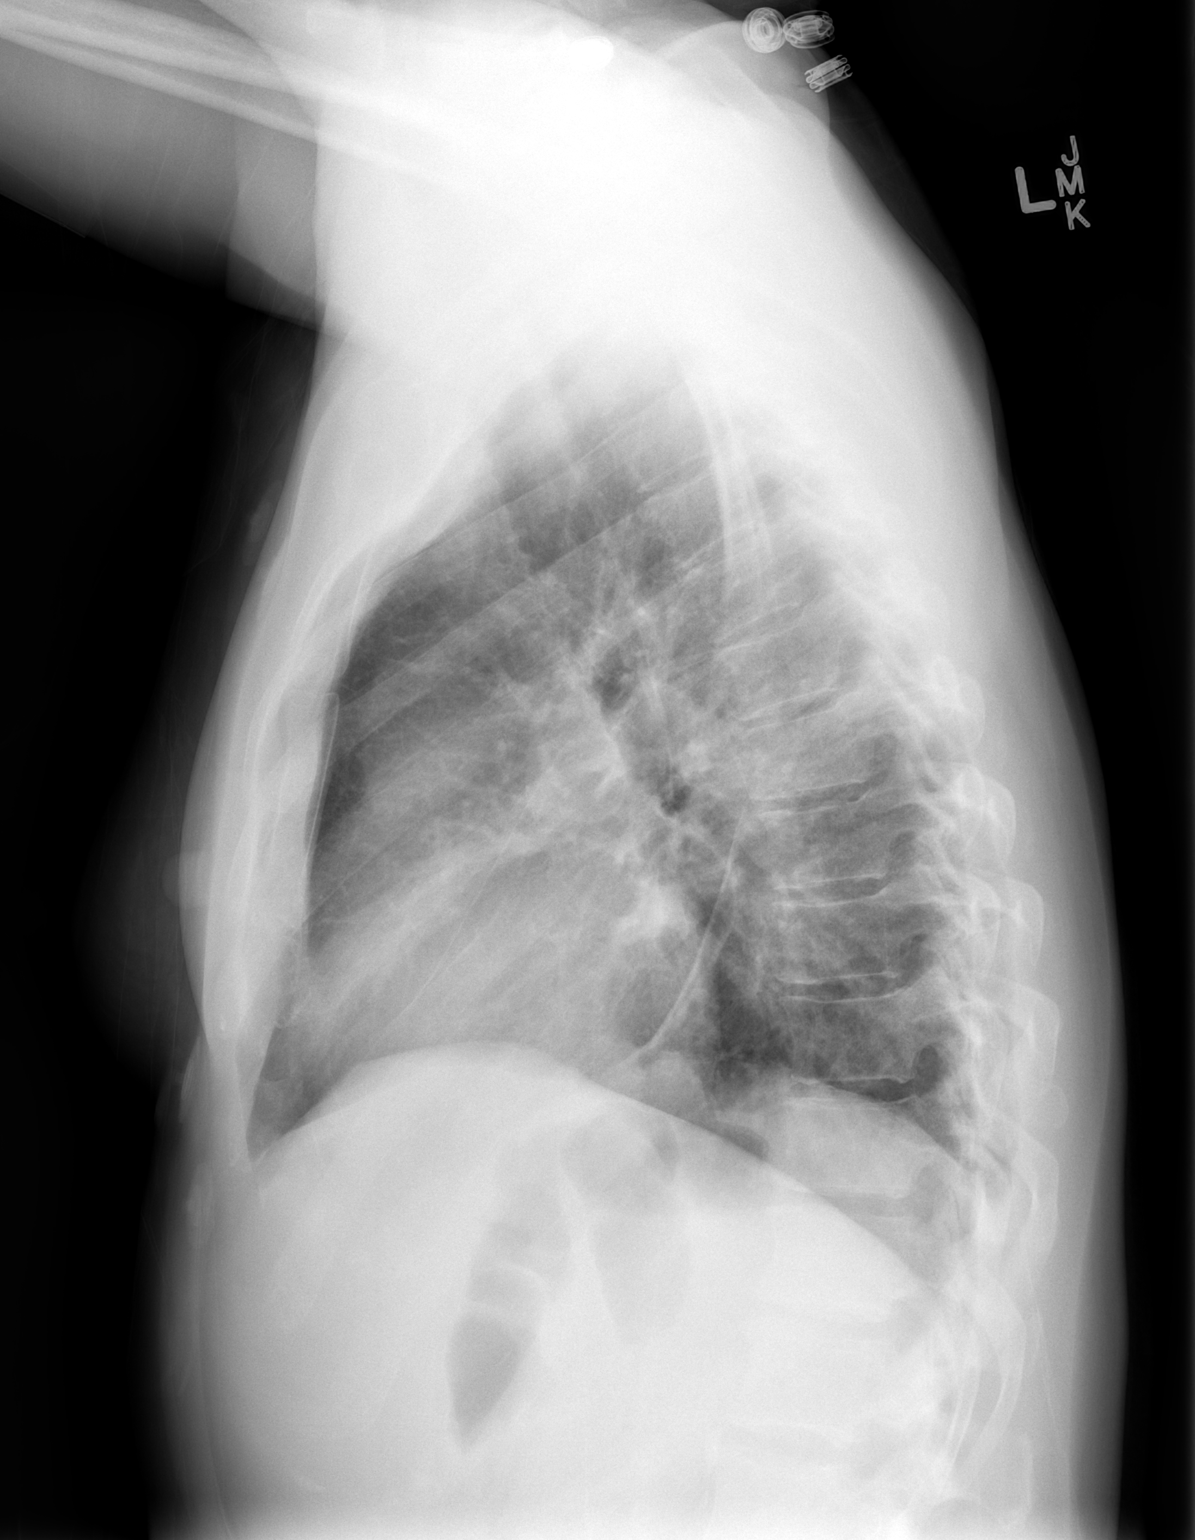

[2 of 2 positions shown; findings below may reference images not displayed]

FINDINGS: Low lung volumes. Discoid atelectasis versus scarring right mid hemi
thorax, stable symmetric smoothly marginated rounded densities at
the lung bases consistent with nipple shadows. Linear areas of
density, mild, within the lung bases. The opacity in the left lung
base and right perihilar region have decreased. No new focal regions
of consolidation. No acute osseous abnormalities.
IMPRESSION: Resolving infiltrates right perihilar and left lung base.

Minimal scarring versus atelectasis within the lung bases

Discoid atelectasis versus scar right mid hemithorax.

## 2016-03-14 ENCOUNTER — Emergency Department (HOSPITAL_COMMUNITY)
Admission: EM | Admit: 2016-03-14 | Discharge: 2016-03-14 | Disposition: A | Discharge status: Home / Self Care | Payer: Medicaid Other | Attending: Emergency Medicine | Admitting: Emergency Medicine

## 2016-03-14 ENCOUNTER — Encounter (HOSPITAL_COMMUNITY): Payer: Self-pay

## 2016-03-14 ENCOUNTER — Emergency Department (HOSPITAL_COMMUNITY): Payer: Medicaid Other

## 2016-03-14 DIAGNOSIS — S61203A Unspecified open wound of left middle finger without damage to nail, initial encounter: Secondary | ICD-10-CM | POA: Diagnosis not present

## 2016-03-14 DIAGNOSIS — T148XXA Other injury of unspecified body region, initial encounter: Secondary | ICD-10-CM

## 2016-03-14 DIAGNOSIS — R0789 Other chest pain: Secondary | ICD-10-CM | POA: Diagnosis present

## 2016-03-14 DIAGNOSIS — S81801A Unspecified open wound, right lower leg, initial encounter: Secondary | ICD-10-CM | POA: Diagnosis not present

## 2016-03-14 DIAGNOSIS — S81802A Unspecified open wound, left lower leg, initial encounter: Secondary | ICD-10-CM | POA: Diagnosis not present

## 2016-03-14 DIAGNOSIS — I10 Essential (primary) hypertension: Secondary | ICD-10-CM | POA: Diagnosis not present

## 2016-03-14 DIAGNOSIS — Y929 Unspecified place or not applicable: Secondary | ICD-10-CM | POA: Diagnosis not present

## 2016-03-14 DIAGNOSIS — F17210 Nicotine dependence, cigarettes, uncomplicated: Secondary | ICD-10-CM | POA: Insufficient documentation

## 2016-03-14 DIAGNOSIS — F329 Major depressive disorder, single episode, unspecified: Secondary | ICD-10-CM | POA: Insufficient documentation

## 2016-03-14 DIAGNOSIS — Z791 Long term (current) use of non-steroidal anti-inflammatories (NSAID): Secondary | ICD-10-CM | POA: Insufficient documentation

## 2016-03-14 DIAGNOSIS — M1990 Unspecified osteoarthritis, unspecified site: Secondary | ICD-10-CM | POA: Insufficient documentation

## 2016-03-14 DIAGNOSIS — X58XXXA Exposure to other specified factors, initial encounter: Secondary | ICD-10-CM | POA: Insufficient documentation

## 2016-03-14 DIAGNOSIS — F1490 Cocaine use, unspecified, uncomplicated: Secondary | ICD-10-CM | POA: Diagnosis not present

## 2016-03-14 DIAGNOSIS — Y939 Activity, unspecified: Secondary | ICD-10-CM | POA: Insufficient documentation

## 2016-03-14 DIAGNOSIS — R238 Other skin changes: Secondary | ICD-10-CM

## 2016-03-14 DIAGNOSIS — Z79899 Other long term (current) drug therapy: Secondary | ICD-10-CM | POA: Diagnosis not present

## 2016-03-14 DIAGNOSIS — Y999 Unspecified external cause status: Secondary | ICD-10-CM | POA: Insufficient documentation

## 2016-03-14 DIAGNOSIS — R079 Chest pain, unspecified: Secondary | ICD-10-CM

## 2016-03-14 LAB — BASIC METABOLIC PANEL
Anion gap: 7 (ref 5–15)
BUN: 16 mg/dL (ref 6–20)
CALCIUM: 9.1 mg/dL (ref 8.9–10.3)
CHLORIDE: 109 mmol/L (ref 101–111)
CO2: 24 mmol/L (ref 22–32)
CREATININE: 0.69 mg/dL (ref 0.44–1.00)
GFR calc Af Amer: >60 mL/min (ref >60)
GFR calc non Af Amer: >60 mL/min (ref >60)
GLUCOSE: 130 mg/dL — AB (ref 65–99)
Potassium: 3.6 mmol/L (ref 3.5–5.1)
Sodium: 140 mmol/L (ref 135–145)

## 2016-03-14 LAB — RAPID URINE DRUG SCREEN, HOSP PERFORMED
Amphetamines: NOT DETECTED
BARBITURATES: NOT DETECTED
BENZODIAZEPINES: NOT DETECTED
COCAINE: POSITIVE — AB
Opiates: NOT DETECTED
TETRAHYDROCANNABINOL: POSITIVE — AB

## 2016-03-14 LAB — CBC
HCT: 33.3 % — ABNORMAL LOW (ref 36.0–46.0)
Hemoglobin: 10.8 g/dL — ABNORMAL LOW (ref 12.0–15.0)
MCH: 27.4 pg (ref 26.0–34.0)
MCHC: 32.4 g/dL (ref 30.0–36.0)
MCV: 84.5 fL (ref 78.0–100.0)
PLATELETS: 339 10*3/uL (ref 150–400)
RBC: 3.94 MIL/uL (ref 3.87–5.11)
RDW: 14.6 % (ref 11.5–15.5)
WBC: 6.2 10*3/uL (ref 4.0–10.5)

## 2016-03-14 LAB — I-STAT TROPONIN, ED: Troponin i, poc: 0 ng/mL (ref 0.00–0.08)

## 2016-03-14 MED ORDER — ASPIRIN EC 325 MG PO TBEC: 325.0000 mg | DELAYED_RELEASE_TABLET | Freq: Once | ORAL | Status: DC

## 2016-03-14 MED ORDER — KETOROLAC TROMETHAMINE 30 MG/ML IJ SOLN
30.0000 mg | Freq: Once | INTRAMUSCULAR | Status: AC
  Administered 2016-03-14: 30 mg via INTRAVENOUS

## 2016-03-14 MED ORDER — ASPIRIN 81 MG PO CHEW
324.0000 mg | CHEWABLE_TABLET | Freq: Once | ORAL | Status: AC
  Administered 2016-03-14: 324 mg via ORAL
  Filled 2016-03-14: qty 4

## 2016-03-14 MED ORDER — PREDNISONE 20 MG PO TABS: 20.0000 mg | ORAL_TABLET | Freq: Two times a day (BID) | ORAL | Status: DC

## 2016-03-14 MED ORDER — KETOROLAC TROMETHAMINE 30 MG/ML IJ SOLN
INTRAMUSCULAR | Status: AC
  Administered 2016-03-14: 30 mg via INTRAVENOUS
  Filled 2016-03-14: qty 1

## 2016-03-14 NOTE — ED Notes (Signed)
Patient arrived by [facility] for central Chestpain, non-radiating with no associated symptoms. Also here for ongoing vasculitis to ankles and lower legs. Left ankle wound, medial, non-draining. Right ankle with healing wounds, minimal drainage. Last cocaine use 2 days ago

## 2016-03-14 NOTE — ED Notes (Signed)
MD at bedside. 

## 2016-03-14 NOTE — ED Provider Notes (Signed)
CSN: [id number]     Arrival date & time 03/14/16  0867 History   First MD Initiated Contact with Patient 03/14/16 0900     Chief Complaint  Patient presents with  . Chest Pain     (Consider location/radiation/quality/duration/timing/severity/associated sxs/prior Treatment) Patient is a 55 y.o. female presenting with chest pain. The history is provided by the patient.  Chest Pain Associated symptoms: no abdominal pain, no fever, no nausea, no numbness, no shortness of breath, not vomiting and no weakness   Patient is a 55 yo F with a PMHx of cocaine abuse, levimasole induced vasculitis, HTN, depression, RA presenting to the emergency room via EMS for chest pain and pain in legs. Patient states her chest pain started this morning when she was laying on the couch. It is "aching" in character, 6-7/10 intensity, substernal, non-radiating, and constant. Denies having any associated nausea, vomiting, or SOB. States she smokes cocaine 2-3 times per week; last used 2 days ago. Patient's BP was noted to be 207/85. States she ran out of her BP medication several weeks ago.  Patient also reports having pain in both her legs and the middle finger of her left hand from chronic wounds. States the pain in her legs has been present "for a very long time"and became worse this morning; 9/10 intensity. Denies having any fevers or chills. States she has been eating and drinking without any problems. Denies any recent travel or calf swelling. Patient is post-menopausal (not on any OCPs).  No further history could be obtained from the patient as she kept saying "I don't know."   Past Medical History  Diagnosis Date  . Vasculitis ([place])     2/2 Levimasole toxicity. Followed by Dr. Louanne Skye  . Hypertension   . Cocaine abuse     ongoing with resultant vaculitis.  . Depression   . Normocytic anemia     BL Hgb 9.8-12. Last anemia panel 04/2010 - showing Fe 19, ferritin 101.  Pt on monthly B12 injections  . VASCULITIS  04/17/2010    2/2 levimasole toxicity vs autoimmune d/o     . CAP (community acquired pneumonia) 03/2014 X 2  . Daily headache   . Migraines     "probably 5-6/yr" (04/21/2014)  . Rheumatoid arthritis(714.0)     patient reported  . Inflammatory arthritis [facility])    Past Surgical History  Procedure Laterality Date  . Skin biopsy Bilateral 04/2010    shin nodules  . Irrigation and debridement abscess Bilateral 09/26/2013    Procedure: DEBRIDEMENT ULCERS BILATERAL THIGHS;  Surgeon: Gwenyth Ober, MD;  Location: [place];  Service: General;  Laterality: Bilateral;  . Hernia repair      "stomach"  . I&d extremity Right 09/26/2015    Procedure: IRRIGATION AND DEBRIDEMENT LEG WOUND  VAC PLACEMENT.;  Surgeon: Loel Lofty Dillingham, DO;  Location: [place];  Service: Plastics;  Laterality: Right;   Family History  Problem Relation Age of Onset  . Breast cancer Mother     Breast cancer  . Alcohol abuse Mother   . Colon cancer Maternal Aunt 22  . Alcohol abuse Father    Social History  Substance Use Topics  . Smoking status: Current Every Day Smoker -- 0.30 packs/day for 34 years    Types: Cigarettes  . Smokeless tobacco: Never Used     Comment: "Trying very hard". / 4-5 CIGARETTES A DAY  . Alcohol Use: No   OB History    No data available  Review of Systems  Constitutional: Negative for fever and chills.  HENT: Negative for congestion and sore throat.   Eyes: Negative for pain and visual disturbance.  Respiratory: Negative for shortness of breath and wheezing.   Cardiovascular: Positive for chest pain. Negative for leg swelling.  Gastrointestinal: Negative for nausea, vomiting, abdominal pain and diarrhea.  Genitourinary: Negative for dysuria and flank pain.  Musculoskeletal: Negative for joint swelling and arthralgias.  Skin:       Chronic wounds  Neurological: Negative for weakness and numbness.      Allergies  Acetaminophen  Home Medications   Prior to Admission medications    Medication Sig Start Date End Date Taking? Authorizing Provider  ibuprofen (ADVIL,MOTRIN) 600 MG tablet Take 1 tablet (600 mg total) by mouth every 6 (six) hours as needed. 01/30/16  Yes Domenic Moras, PA-C  potassium chloride SA (K-DUR,KLOR-CON) 20 MEQ tablet Take 1 tablet (20 mEq total) by mouth 2 (two) times daily. 11/20/15  Yes Corky Sox, MD  Amino Acids-Protein Hydrolys (FEEDING SUPPLEMENT, PRO-STAT SUGAR FREE 64,) LIQD Take 30 mLs by mouth daily. Patient not taking: Reported on 12/13/2015 10/28/15   Jule Ser, DO  feeding supplement (BOOST / RESOURCE BREEZE) LIQD Take 1 Container by mouth 3 (three) times daily between meals. Patient not taking: Reported on 12/13/2015 10/28/15   Jule Ser, DO  meloxicam (MOBIC) 7.5 MG tablet Take 1 tablet (7.5 mg total) by mouth daily. Patient not taking: Reported on 01/30/2016 11/20/15 11/19/16  Corky Sox, MD  Multiple Vitamin (MULTIVITAMIN WITH MINERALS) TABS tablet Take 1 tablet by mouth daily. Patient not taking: Reported on 12/13/2015 10/28/15   Jule Ser, DO   BP 297/85 mmHg  Pulse 70  Temp(Src) 98.2 F (36.8 C) (Oral)  Resp 18  Wt 45.36 kg  SpO2 100% Physical Exam  Constitutional: She is oriented to person, place, and time.  Frail African American female lying in hospital stretcher crying.    HENT:  Head: Normocephalic and atraumatic.  Mouth/Throat: Oropharynx is clear and moist.  Eyes: EOM are normal. Pupils are equal, round, and reactive to light.  Neck: Neck supple. No tracheal deviation present.  Cardiovascular: Normal rate, regular rhythm and intact distal pulses.  Exam reveals no gallop and no friction rub.   No murmur heard. Pulmonary/Chest: Effort normal and breath sounds normal. No respiratory distress. She has no wheezes. She has no rales.  Abdominal: Soft. Bowel sounds are normal. She exhibits no distension. There is no tenderness.  Musculoskeletal: Normal range of motion. She exhibits no edema or tenderness.   Neurological: She is alert and oriented to person, place, and time.  Skin: Skin is warm and dry. She is not diaphoretic.  L hand: Wound on the dorsal surface of the right middle finger. No pus drainage. No surrounding increased erythema or warmth.  LLE: Wound on the medial tibial surface. No pus drainage. No surrounding increased erythema or warmth.  RLE: Large wound on the posterior calf area. No pus drainage. No surrounding increased erythema or warmth.   Please see attached images.          ED Course  Procedures (including critical care time) Labs Review Labs Reviewed  BASIC METABOLIC PANEL - Abnormal; Notable for the following:    Glucose, Bld 130 (*)    All other components within normal limits  CBC - Abnormal; Notable for the following:    Hemoglobin 10.8 (*)    HCT 33.3 (*)    All other components within  normal limits  URINE RAPID DRUG SCREEN, [place], ED    Imaging Review No results found. I have personally reviewed and evaluated these images and lab results as part of my medical decision-making.   EKG Interpretation   Date/Time:  Saturday March 14 2016 08:31:32 EDT Ventricular Rate:  76 PR Interval:  126 QRS Duration: 91 QT Interval:  420 QTC Calculation: 472 R Axis:   85 Text Interpretation:  Sinus rhythm Right atrial enlargement LVH with  secondary repolarization abnormality Confirmed by DELO  MD, DOUGLAS  ([id number]) on 03/14/2016 8:42:24 AM      MDM   Final diagnoses:  None   Chest pain Presenting with acute onset chest pain at rest. It is "aching" in character, 6-7/10 intensity, substernal, non-radiating, and constant. Denies having any associated nausea, vomiting, or SOB. Reports using cocaine 2 days ago. BP 297/85 on arrival; repeat 191/86. SpO2 100% on RA, not tachycardic. EKG is not showing any acute ST/ T wave changes. Istat troponin negative. Chest pain not likely 2/2 to ACS. Wells score 0 making PE less likely. UDS is  positive for cocaine and THC. Her chest pain is likely due to cocaine induced vasospasm.  -Given Aspirin 324 mg once  -Prednisone 20 mg BID for 5 days   Skin wounds Patient has a history of levimasole induced vasculitis with chronic skin wounds. She is now presenting with painful wounds on her bilateral lower extremities and middle finger of L hand. There is no pus drainage from these wounds. Surrounding skin does not have increased erythema or warmth. She is afebrile and does not have leukocytosis.  -Given Toradol 30 mg once for pain -Patient has been advised to follow-up with her PCP and wound care clinic as soon as possible.     Shela Leff, MD 03/14/16 [place], MD 03/15/16 [phone]

## 2016-03-14 NOTE — Discharge Instructions (Signed)
You may take over-the-counter Ibuprofen for pain.  Please follow-up with your primary care doctor as soon as possible for a visit within the next 3 days.

## 2016-03-30 IMAGING — CR DG CHEST 2V
2 series · 2 of 2 positions shown · non-contrast
Comparison: 03/29/2014

CLINICAL DATA: Chest pain

EXAM:
CHEST  2 VIEW

[w chest pa]
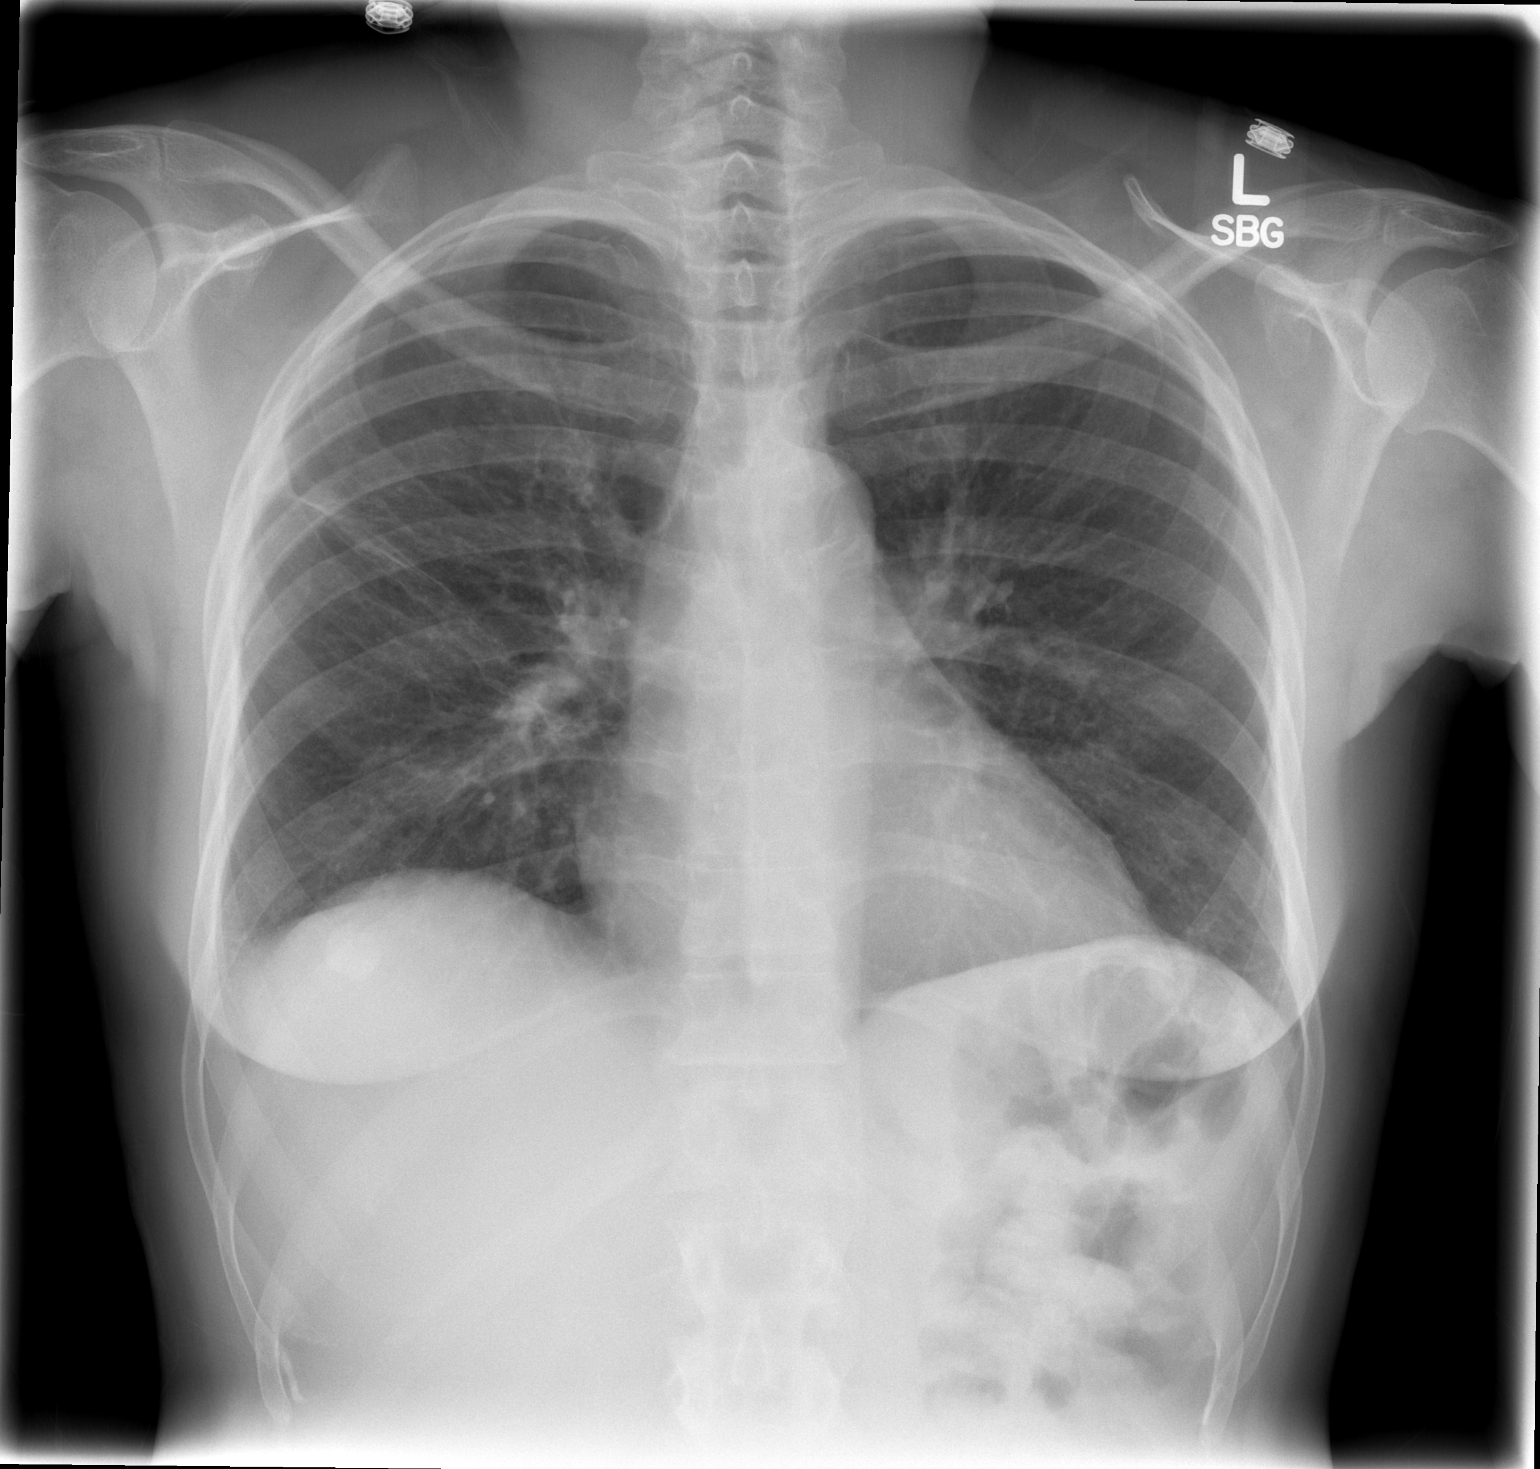

[w chest lat]
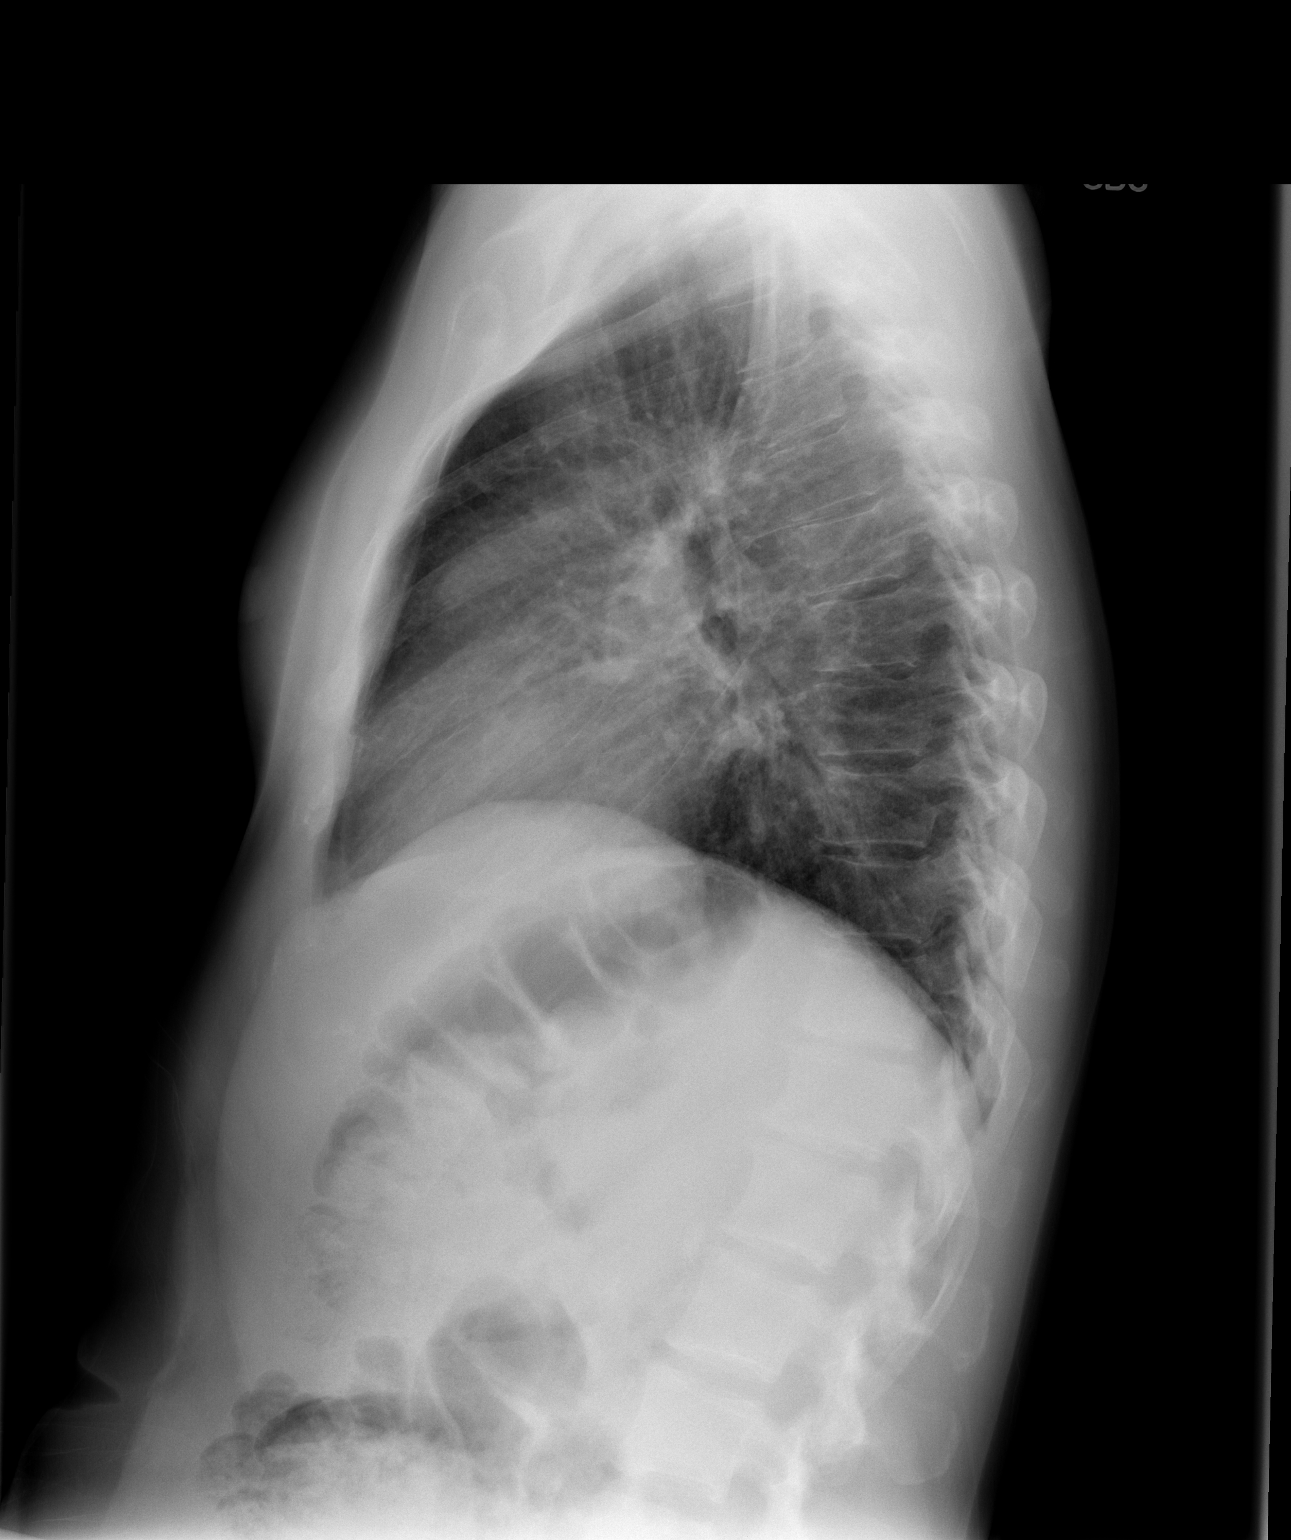

[2 of 2 positions shown; findings below may reference images not displayed]

FINDINGS: Normal heart size and upper mediastinal contours. There is chronic
scarring present at the level of the mid right chest. There is no
edema, consolidation, effusion, or pneumothorax. Bilateral nipple
shadows.
IMPRESSION: No active cardiopulmonary disease.

## 2016-04-01 ENCOUNTER — Encounter (HOSPITAL_COMMUNITY): Payer: Self-pay | Admitting: Emergency Medicine

## 2016-04-01 ENCOUNTER — Inpatient Hospital Stay (HOSPITAL_COMMUNITY)
Admission: EM | Admit: 2016-04-01 | Discharge: 2016-04-04 | DRG: 607 | Disposition: A | Discharge status: Home / Self Care | Payer: Medicaid Other | Attending: Student in an Organized Health Care Education/Training Program | Admitting: Student in an Organized Health Care Education/Training Program

## 2016-04-01 DIAGNOSIS — I776 Arteritis, unspecified: Secondary | ICD-10-CM

## 2016-04-01 DIAGNOSIS — L97219 Non-pressure chronic ulcer of right calf with unspecified severity: Secondary | ICD-10-CM | POA: Diagnosis present

## 2016-04-01 DIAGNOSIS — F329 Major depressive disorder, single episode, unspecified: Secondary | ICD-10-CM | POA: Diagnosis present

## 2016-04-01 DIAGNOSIS — L03011 Cellulitis of right finger: Secondary | ICD-10-CM | POA: Diagnosis present

## 2016-04-01 DIAGNOSIS — L02511 Cutaneous abscess of right hand: Secondary | ICD-10-CM | POA: Diagnosis not present

## 2016-04-01 DIAGNOSIS — L0390 Cellulitis, unspecified: Secondary | ICD-10-CM | POA: Diagnosis present

## 2016-04-01 DIAGNOSIS — L97329 Non-pressure chronic ulcer of left ankle with unspecified severity: Secondary | ICD-10-CM | POA: Diagnosis present

## 2016-04-01 DIAGNOSIS — F17210 Nicotine dependence, cigarettes, uncomplicated: Secondary | ICD-10-CM | POA: Diagnosis present

## 2016-04-01 DIAGNOSIS — B9689 Other specified bacterial agents as the cause of diseases classified elsewhere: Secondary | ICD-10-CM | POA: Diagnosis not present

## 2016-04-01 DIAGNOSIS — L03113 Cellulitis of right upper limb: Secondary | ICD-10-CM | POA: Diagnosis present

## 2016-04-01 DIAGNOSIS — F1420 Cocaine dependence, uncomplicated: Secondary | ICD-10-CM | POA: Diagnosis present

## 2016-04-01 DIAGNOSIS — Z791 Long term (current) use of non-steroidal anti-inflammatories (NSAID): Secondary | ICD-10-CM

## 2016-04-01 DIAGNOSIS — L03116 Cellulitis of left lower limb: Secondary | ICD-10-CM | POA: Diagnosis present

## 2016-04-01 DIAGNOSIS — L958 Other vasculitis limited to the skin: Secondary | ICD-10-CM | POA: Diagnosis not present

## 2016-04-01 DIAGNOSIS — L98499 Non-pressure chronic ulcer of skin of other sites with unspecified severity: Secondary | ICD-10-CM | POA: Diagnosis present

## 2016-04-01 DIAGNOSIS — D649 Anemia, unspecified: Secondary | ICD-10-CM | POA: Diagnosis present

## 2016-04-01 DIAGNOSIS — Z7952 Long term (current) use of systemic steroids: Secondary | ICD-10-CM

## 2016-04-01 DIAGNOSIS — Z886 Allergy status to analgesic agent status: Secondary | ICD-10-CM

## 2016-04-01 DIAGNOSIS — T374X5A Adverse effect of anthelminthics, initial encounter: Secondary | ICD-10-CM | POA: Diagnosis present

## 2016-04-01 DIAGNOSIS — M069 Rheumatoid arthritis, unspecified: Secondary | ICD-10-CM | POA: Diagnosis present

## 2016-04-01 DIAGNOSIS — I10 Essential (primary) hypertension: Secondary | ICD-10-CM | POA: Diagnosis present

## 2016-04-01 DIAGNOSIS — F14288 Cocaine dependence with other cocaine-induced disorder: Secondary | ICD-10-CM | POA: Diagnosis not present

## 2016-04-01 DIAGNOSIS — R000 Tachycardia, unspecified: Secondary | ICD-10-CM

## 2016-04-01 LAB — PROCALCITONIN: PROCALCITONIN: 1.07 ng/mL

## 2016-04-01 LAB — COMPREHENSIVE METABOLIC PANEL
ALK PHOS: 77 U/L (ref 38–126)
ALT: 11 U/L — AB (ref 14–54)
ANION GAP: 11 (ref 5–15)
AST: 18 U/L (ref 15–41)
Albumin: 3.6 g/dL (ref 3.5–5.0)
BILIRUBIN TOTAL: 0.7 mg/dL (ref 0.3–1.2)
BUN: 21 mg/dL — ABNORMAL HIGH (ref 6–20)
CALCIUM: 8.9 mg/dL (ref 8.9–10.3)
CO2: 22 mmol/L (ref 22–32)
CREATININE: 0.89 mg/dL (ref 0.44–1.00)
Chloride: 101 mmol/L (ref 101–111)
GFR calc non Af Amer: >60 mL/min (ref >60)
GLUCOSE: 98 mg/dL (ref 65–99)
Potassium: 3.8 mmol/L (ref 3.5–5.1)
Sodium: 134 mmol/L — ABNORMAL LOW (ref 135–145)
TOTAL PROTEIN: 7.8 g/dL (ref 6.5–8.1)

## 2016-04-01 LAB — URINE MICROSCOPIC-ADD ON: WBC, UA: NONE SEEN WBC/hpf (ref 0–5)

## 2016-04-01 LAB — RAPID URINE DRUG SCREEN, HOSP PERFORMED
AMPHETAMINES: NOT DETECTED
BENZODIAZEPINES: NOT DETECTED
Barbiturates: NOT DETECTED
COCAINE: POSITIVE — AB
OPIATES: POSITIVE — AB
TETRAHYDROCANNABINOL: NOT DETECTED

## 2016-04-01 LAB — URINALYSIS, ROUTINE W REFLEX MICROSCOPIC
Bilirubin Urine: NEGATIVE
Glucose, UA: NEGATIVE mg/dL
Ketones, ur: NEGATIVE mg/dL
Leukocytes, UA: NEGATIVE
NITRITE: NEGATIVE
Protein, ur: 30 mg/dL — AB
SPECIFIC GRAVITY, URINE: 1.021 (ref 1.005–1.030)
pH: 5.5 (ref 5.0–8.0)

## 2016-04-01 LAB — CBC WITH DIFFERENTIAL/PLATELET
Basophils Absolute: 0 10*3/uL (ref 0.0–0.1)
Basophils Relative: 0 %
Eosinophils Absolute: 0 10*3/uL (ref 0.0–0.7)
Eosinophils Relative: 0 %
HEMATOCRIT: 35.9 % — AB (ref 36.0–46.0)
HEMOGLOBIN: 12.8 g/dL (ref 12.0–15.0)
LYMPHS ABS: 0.8 10*3/uL (ref 0.7–4.0)
LYMPHS PCT: 10 %
MCH: 28.6 pg (ref 26.0–34.0)
MCHC: 35.7 g/dL (ref 30.0–36.0)
MCV: 80.1 fL (ref 78.0–100.0)
MONOS PCT: 4 %
Monocytes Absolute: 0.3 10*3/uL (ref 0.1–1.0)
NEUTROS ABS: 6.3 10*3/uL (ref 1.7–7.7)
NEUTROS PCT: 86 %
Platelets: 294 10*3/uL (ref 150–400)
RBC: 4.48 MIL/uL (ref 3.87–5.11)
RDW: 14.9 % (ref 11.5–15.5)
WBC: 7.3 10*3/uL (ref 4.0–10.5)

## 2016-04-01 LAB — I-STAT CG4 LACTIC ACID, ED: Lactic Acid, Venous: 1.3 mmol/L (ref 0.5–1.9)

## 2016-04-01 LAB — MRSA PCR SCREENING: MRSA by PCR: POSITIVE — AB

## 2016-04-01 IMAGING — CT CT NECK W/ CM
2 of 3 series · 8 of 14 positions shown, 9 images · IV contrast (OMNIPAQUE 300)
Comparison: CT 09/15/2010.

CLINICAL DATA: Neck swelling.

EXAM:
CT NECK WITH CONTRAST
TECHNIQUE: Multidetector CT imaging of the neck was performed using the
standard protocol following the bolus administration of intravenous
contrast.
CONTRAST:  100mL OMNIPAQUE IOHEXOL 300 MG/ML  SOLN

[Series 2: neck with st · axial · 0.31mm/px · z∈[-185,-89]mm · 4 of 81 slices shown, 5 images]
[im 17/81  soft-tissue]
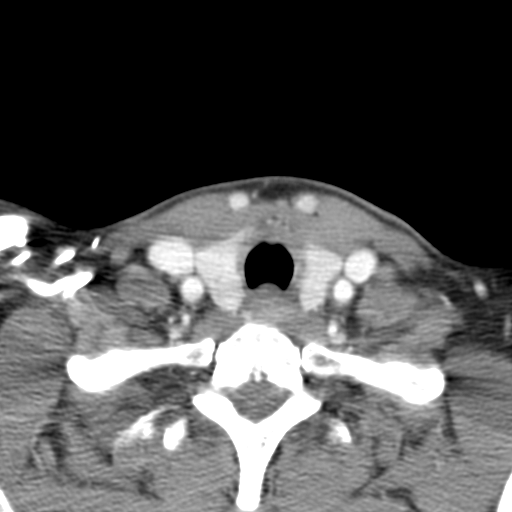
[im 17/81  bone]
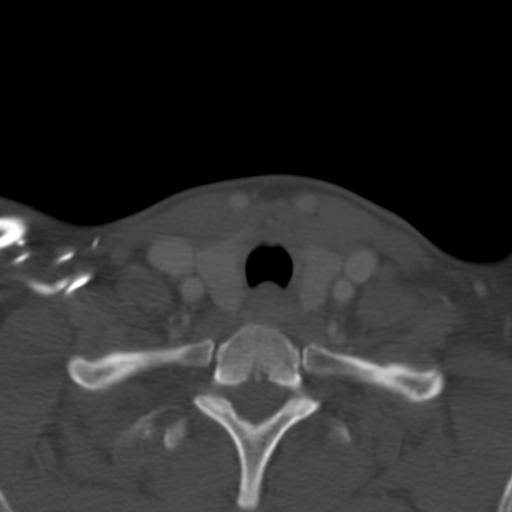
[im 33/81  bone]
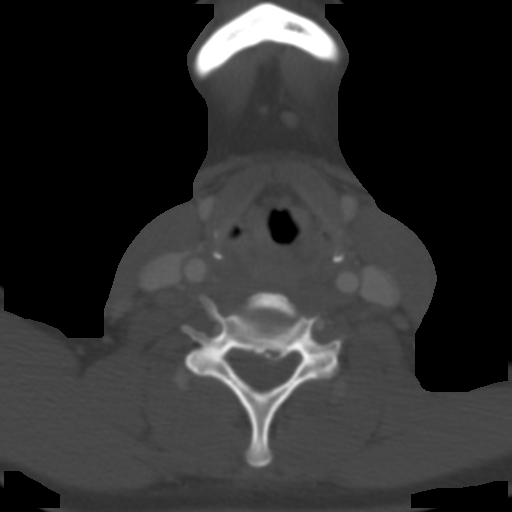
[im 49/81  bone]
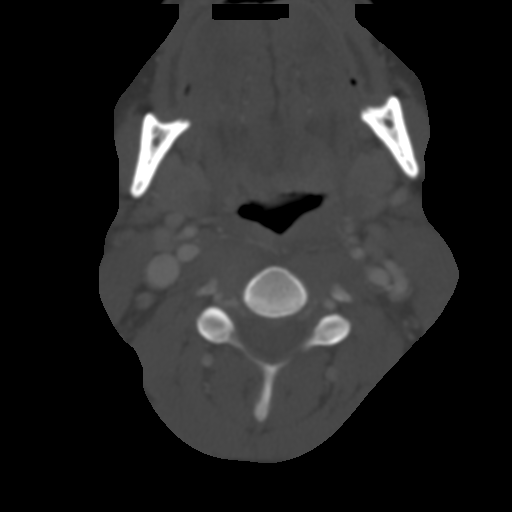
[im 65/81  bone]
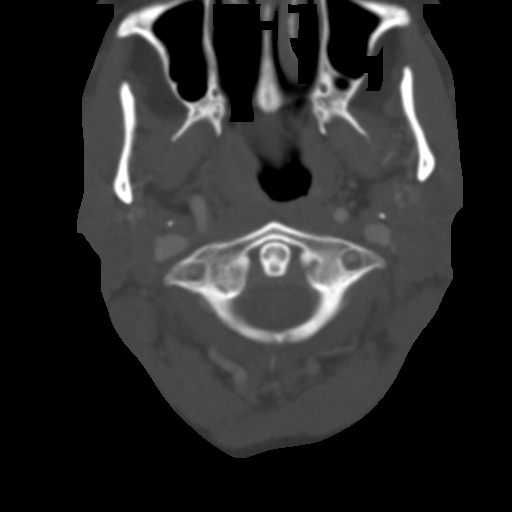

[Series 5: axial recons · axial · 0.27mm/px · z∈[-193,-98]mm · 4 of 81 slices shown]
[im 17/81  bone]
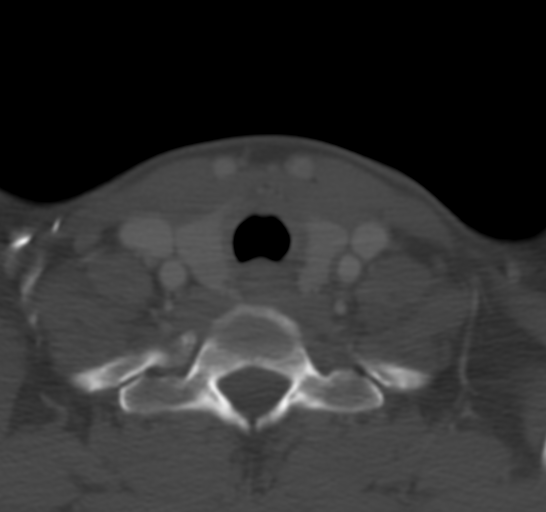
[im 33/81  bone]
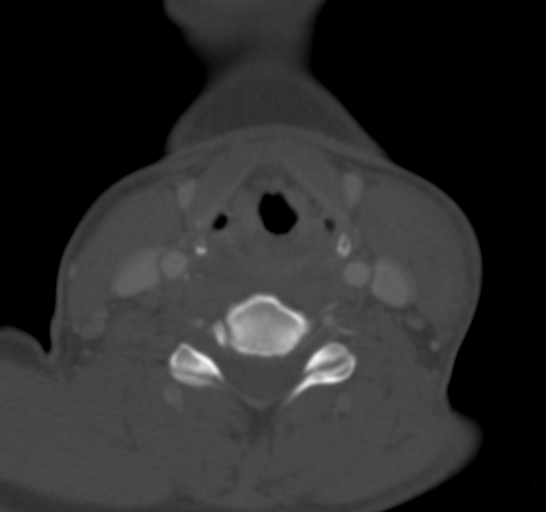
[im 49/81  bone]
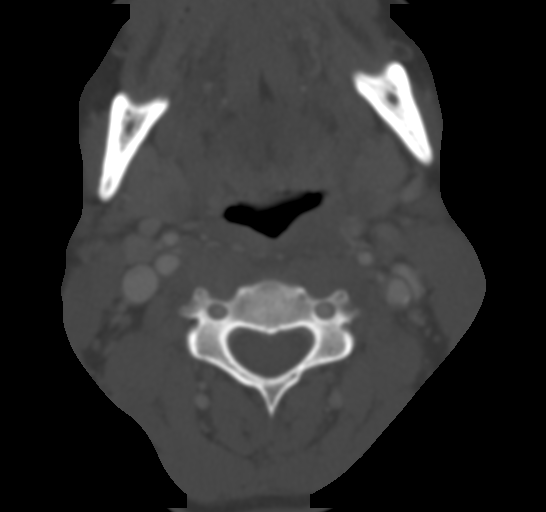
[im 65/81  bone]
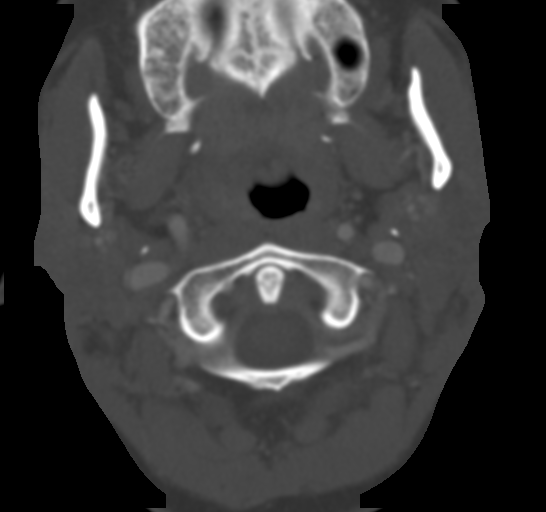

[8 of 14 positions shown; findings below may reference images not displayed]

FINDINGS: Skull base is normal. Nasopharynx is patent. Mild tonsillar soft
tissue fullness noted. Mild tonsillitis cannot be excluded that.
Tongue base and tongue are unremarkable. The larynx is unremarkable.
Trachea is widely patent. Thyroid is unremarkable. Parotid and
submandibular glands normal. Multiple cervical lymph nodes are noted
lymph nodes measure up to 6 mm. Of pulmonary apices are clear. The
retropharyngeal space and bony structures are normal. Apical pleural
parenchymal thickening noted most consistent with scarring. Similar
finding noted on prior study.
IMPRESSION: Mild peritonsillar soft tissue fullness. Mild tonsillitis cannot be
excluded. No other significant abnormality identified.

## 2016-04-01 MED ORDER — CHLORHEXIDINE GLUCONATE CLOTH 2 % EX PADS
6.0000 | MEDICATED_PAD | Freq: Every day | CUTANEOUS | Status: DC
Start: 2016-04-02 — End: 2016-04-04
  Administered 2016-04-02 – 2016-04-04 (×3): 6 via TOPICAL

## 2016-04-01 MED ORDER — ONDANSETRON HCL 4 MG/2ML IJ SOLN
4.0000 mg | Freq: Three times a day (TID) | INTRAMUSCULAR | Status: DC | PRN
Start: 2016-04-01 — End: 2016-04-01

## 2016-04-01 MED ORDER — MUPIROCIN 2 % EX OINT
TOPICAL_OINTMENT | Freq: Two times a day (BID) | CUTANEOUS | Status: DC
Start: 2016-04-01 — End: 2016-04-04
  Administered 2016-04-01 – 2016-04-04 (×6): via NASAL
  Filled 2016-04-01 (×3): qty 22

## 2016-04-01 MED ORDER — KETOROLAC TROMETHAMINE 30 MG/ML IJ SOLN
30.0000 mg | Freq: Once | INTRAMUSCULAR | Status: AC
  Administered 2016-04-01: 30 mg via INTRAVENOUS
  Filled 2016-04-01: qty 1

## 2016-04-01 MED ORDER — SODIUM CHLORIDE 0.9 % IV BOLUS (SEPSIS)
30.0000 mL/kg | Freq: Once | INTRAVENOUS | Status: AC
  Administered 2016-04-01: 1362 mL via INTRAVENOUS

## 2016-04-01 MED ORDER — HYDROMORPHONE HCL 1 MG/ML IJ SOLN: 0.5000 mg | INTRAMUSCULAR | Status: DC | PRN

## 2016-04-01 MED ORDER — IBUPROFEN 600 MG PO TABS
600.0000 mg | ORAL_TABLET | Freq: Four times a day (QID) | ORAL | Status: DC | PRN
  Administered 2016-04-01 – 2016-04-02 (×2): 600 mg via ORAL
  Filled 2016-04-01 (×3): qty 1

## 2016-04-01 MED ORDER — SODIUM CHLORIDE 0.9 % IV SOLN
Freq: Once | INTRAVENOUS | Status: AC
  Administered 2016-04-01: 15:00:00 via INTRAVENOUS

## 2016-04-01 MED ORDER — MORPHINE SULFATE (PF) 4 MG/ML IV SOLN
4.0000 mg | Freq: Once | INTRAVENOUS | Status: AC
  Administered 2016-04-01: 4 mg via INTRAVENOUS
  Filled 2016-04-01: qty 1

## 2016-04-01 MED ORDER — ADULT MULTIVITAMIN W/MINERALS CH
1.0000 | ORAL_TABLET | Freq: Every day | ORAL | Status: DC
  Administered 2016-04-01 – 2016-04-04 (×4): 1 via ORAL
  Filled 2016-04-01 (×4): qty 1

## 2016-04-01 MED ORDER — CEFAZOLIN IN D5W 1 GM/50ML IV SOLN
1.0000 g | Freq: Once | INTRAVENOUS | Status: AC
  Administered 2016-04-01: 1 g via INTRAVENOUS
  Filled 2016-04-01: qty 50

## 2016-04-01 MED ORDER — SODIUM CHLORIDE 0.9 % IV SOLN: INTRAVENOUS | Status: DC

## 2016-04-01 MED ORDER — PRO-STAT SUGAR FREE PO LIQD
30.0000 mL | Freq: Every day | ORAL | Status: DC
  Administered 2016-04-01 – 2016-04-04 (×4): 30 mL via ORAL
  Filled 2016-04-01 (×4): qty 30

## 2016-04-01 MED ORDER — BOOST / RESOURCE BREEZE PO LIQD
1.0000 | Freq: Three times a day (TID) | ORAL | Status: DC
  Administered 2016-04-01 – 2016-04-04 (×7): 1 via ORAL

## 2016-04-01 MED ORDER — PREDNISONE 20 MG PO TABS
40.0000 mg | ORAL_TABLET | Freq: Every day | ORAL | Status: DC
  Administered 2016-04-02 – 2016-04-04 (×3): 40 mg via ORAL
  Filled 2016-04-01 (×3): qty 2

## 2016-04-01 MED ORDER — ENOXAPARIN SODIUM 40 MG/0.4ML ~~LOC~~ SOLN
40.0000 mg | SUBCUTANEOUS | Status: DC
  Administered 2016-04-01 – 2016-04-03 (×3): 40 mg via SUBCUTANEOUS
  Filled 2016-04-01 (×3): qty 0.4

## 2016-04-01 MED ORDER — DEXTROSE 5 % IV SOLN
1.0000 g | INTRAVENOUS | Status: DC
  Administered 2016-04-02: 1 g via INTRAVENOUS
  Filled 2016-04-01: qty 10

## 2016-04-01 MED ORDER — DEXTROSE 5 % IV SOLN: 1.0000 g | INTRAVENOUS | Status: DC

## 2016-04-01 MED ORDER — SODIUM CHLORIDE 0.9 % IV BOLUS (SEPSIS): 1000.0000 mL | Freq: Once | INTRAVENOUS | Status: DC

## 2016-04-01 MED ORDER — ONDANSETRON HCL 4 MG/2ML IJ SOLN
4.0000 mg | Freq: Once | INTRAMUSCULAR | Status: AC
  Administered 2016-04-01: 4 mg via INTRAVENOUS
  Filled 2016-04-01: qty 2

## 2016-04-01 NOTE — ED Notes (Signed)
Bed: SL37 Expected date:  Expected time:  Means of arrival:  Comments: EMS- 55yo F, weakness/BLE wounds

## 2016-04-01 NOTE — ED Provider Notes (Signed)
CSN: [id number]     Arrival date & time 04/01/16  1059 History   First MD Initiated Contact with Patient 04/01/16 1107     Chief Complaint  Patient presents with  . Wound Infection     (Consider location/radiation/quality/duration/timing/severity/associated sxs/prior Treatment) HPI Cristina DEHNE is a 55 y.o. 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 She reports increased pain to the right lower leg, left lower leg, swelling around the wound to the left leg with drainage. She also reports swelling and redness to the right forearm and right distal middle finger which appears to have paronychia.  She denies any fever, but states she's been having chills. She reports daily cocaine use.  She was seen 18 days ago in the office, at that time wounds were noted, with no signs of superimposed infection. Pt is supposed to  Be dressing changes and follow up with wound clinic, there was no dressings on the wounds when she came in and it is unclear if she is changing her dressigns. There is no mention of any swelling to the right forearm at that time either.   Past Medical History  Diagnosis Date  . 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 Followed by Dr. Louanne Skye  . Hypertension   . Cocaine abuse     ongoing with resultant vaculitis.  . Depression   . Normocytic anemia     BL Hgb 9.8-12. Last anemia panel 04/2010 - showing Fe 19, ferritin 101.  Pt on monthly B12 injections  . 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 CAP (community acquired pneumonia) 03/2014 X 2  . Daily headache   . Migraines     "probably 5-6/yr" (04/21/2014)  . Rheumatoid arthritis(714.0)     patient reported  . Inflammatory arthritis [facility])    Past Surgical History  Procedure Laterality Date  . Skin biopsy  Bilateral 04/2010    shin nodules  . Irrigation and debridement abscess Bilateral 09/26/2013    Procedure: DEBRIDEMENT ULCERS BILATERAL THIGHS;  Surgeon: Gwenyth Ober, MD;  Location: [place];  Service: General;  Laterality: Bilateral;  . Hernia repair      "stomach"  . I&d extremity Right 09/26/2015    Procedure: IRRIGATION AND DEBRIDEMENT LEG WOUND  VAC PLACEMENT.;  Surgeon: Loel Lofty Dillingham, DO;  Location: [facility];  Service: Plastics;  Laterality: Right;   Family History  Problem Relation Age of Onset  . Breast cancer Mother     Breast cancer  . Alcohol abuse Mother   . Colon cancer Maternal Aunt 75  . Alcohol abuse Father    Social History  Substance Use Topics  . Smoking status: Current Every Day Smoker -- 0.30 packs/day for 34 years    Types: Cigarettes  . Smokeless tobacco: Never Used     Comment: "Trying very hard". / 4-5 CIGARETTES A DAY  . Alcohol Use: No   OB History    No data available     Review of Systems  Constitutional: Positive for chills. Negative for fever.  Respiratory: Negative for cough, chest tightness and shortness of breath.   Cardiovascular: Negative for chest pain, palpitations and leg swelling.  Gastrointestinal: Negative for nausea, vomiting, abdominal pain and diarrhea.  Genitourinary: Negative for dysuria, flank pain and pelvic pain.  Musculoskeletal: Positive  for myalgias and arthralgias. Negative for neck pain and neck stiffness.  Skin: Positive for color change and wound. Negative for rash.  Neurological: Negative for dizziness, weakness and headaches.  All other systems reviewed and are negative.     Allergies  Acetaminophen  Home Medications   Prior to Admission medications   Medication Sig Start Date End Date Taking? Authorizing Provider  Amino Acids-Protein Hydrolys (FEEDING SUPPLEMENT, PRO-STAT SUGAR FREE 64,) LIQD Take 30 mLs by mouth daily. Patient not taking: Reported on 12/13/2015 10/28/15   Jule Ser, DO  feeding  supplement (BOOST / RESOURCE BREEZE) LIQD Take 1 Container by mouth 3 (three) times daily between meals. Patient not taking: Reported on 12/13/2015 10/28/15   Jule Ser, DO  ibuprofen (ADVIL,MOTRIN) 600 MG tablet Take 1 tablet (600 mg total) by mouth every 6 (six) hours as needed. 01/30/16   Domenic Moras, PA-C  meloxicam (MOBIC) 7.5 MG tablet Take 1 tablet (7.5 mg total) by mouth daily. Patient not taking: Reported on 01/30/2016 11/20/15 11/19/16  Corky Sox, MD  Multiple Vitamin (MULTIVITAMIN WITH MINERALS) TABS tablet Take 1 tablet by mouth daily. Patient not taking: Reported on 12/13/2015 10/28/15   Jule Ser, DO  potassium chloride SA (K-DUR,KLOR-CON) 20 MEQ tablet Take 1 tablet (20 mEq total) by mouth 2 (two) times daily. 11/20/15   Corky Sox, MD  predniSONE (DELTASONE) 20 MG tablet Take 1 tablet (20 mg total) by mouth 2 (two) times daily with a meal. 03/14/16   Shela Leff, MD   BP 153/85 mmHg  Pulse 106  Temp(Src) 98.4 F (36.9 C) (Oral)  Resp 18  SpO2 100% Physical Exam  Constitutional: She is oriented to person, place, and time. She appears well-developed and well-nourished.  Tearful, sobbing loudly  HENT:  Head: Normocephalic.  Eyes: Conjunctivae are normal.  Neck: Neck supple.  Cardiovascular: Normal rate, regular rhythm and normal heart sounds.   Pulmonary/Chest: Effort normal and breath sounds normal. No respiratory distress. She has no wheezes. She has no rales.  Abdominal: Soft. Bowel sounds are normal. She exhibits no distension. There is no tenderness. There is no rebound.  Musculoskeletal: She exhibits no edema.  2 wounds to the posterior right calf, each measuring approximately 8 x 4 and 6 x 4 cm. No erythema or drainage around those wounds. Wounds are black in color with dry scabbing. There is a wound to the medial left ankle that is proximally 5 x 5 cm. It is erythematous, swollen, draining foul smelling drainage. There is swelling and erythema to the right  forearm with no skin lesions or palpable induration or an abscess. There is paronychia to the right middle finger that is tender to palpation.  Neurological: She is alert and oriented to person, place, and time.  Skin: Skin is warm and dry.  Psychiatric: She has a normal mood and affect. Her behavior is normal.  Nursing note and vitals reviewed.   ED Course  Procedures (including critical care time) Labs Review Labs Reviewed  MRSA PCR SCREENING - Abnormal; Notable for the following:    MRSA by PCR POSITIVE (*)    All other components within normal limits  CBC WITH DIFFERENTIAL/PLATELET - Abnormal; Notable for the following:    HCT 35.9 (*)    All other components within normal limits  COMPREHENSIVE METABOLIC PANEL - Abnormal; Notable for the following:    Sodium 134 (*)    BUN 21 (*)    ALT 11 (*)    All other components  within normal limits  URINALYSIS, ROUTINE W REFLEX MICROSCOPIC (NOT AT [facility]) - Abnormal; Notable for the following:    APPearance CLOUDY (*)    Hgb urine dipstick SMALL (*)    Protein, ur 30 (*)    All other components within normal limits  URINE RAPID DRUG SCREEN, HOSP PERFORMED - Abnormal; Notable for the following:    Opiates POSITIVE (*)    Cocaine POSITIVE (*)    All other components within normal limits  URINE MICROSCOPIC-ADD ON - Abnormal; Notable for the following:    Squamous Epithelial / LPF 0-5 (*)    Bacteria, UA RARE (*)    All other components within normal limits  CULTURE, BLOOD (ROUTINE X 2)  CULTURE, BLOOD (ROUTINE X 2)  PROCALCITONIN  COMPREHENSIVE METABOLIC PANEL  HIV ANTIBODY (ROUTINE TESTING)  I-STAT CG4 LACTIC ACID, ED    Imaging Review No results found. I have personally reviewed and evaluated these images and lab results as part of my medical decision-making.   EKG Interpretation None      MDM   Final diagnoses:  Cellulitis of right forearm  Vasculitis ([place])  Paronychia, right    Patient with known vasculitis from  cocaine use. Presents with increased pain. Patient does have multiple wounds to her lower extremities and now swelling to the right forearm and right anger. The left ankle wound does appear to be draining and looks edematous with mild erythema around the wound. This was not noted on her last exam 18 days ago. Also suspect cellulitis of the right forearm. Patient is mildly tachycardic, afebrile, she is crying. Will check labs. Pain medications ordered. Will check rectal temp   Low-grade rectal temperature 100.1. Labs unremarkable. Patient's lesions are chronic, however I am worried about cellulitis to the right forearm and new infection to the left ankle. I spoke with patient's primary care doctor team, internal medicine teaching service, they will admit her. I started her on antibiotics based on cellulitis antibiotic protocol from Epic, ordered Ancef. She has had negative MRSA screening in the past. Will transfer to call for further treatment.  Filed Vitals:   04/01/16 1842 04/01/16 1844 04/01/16 1845 04/01/16 1846  BP: 149/77 130/75 154/86 175/88  Pulse: 126 126 129 125  Temp:      TempSrc:      Resp:      Height:      Weight:      SpO2:          Jeannett Senior, PA-C 04/01/16 2048  Leonard Schwartz, MD 04/02/16 [phone]

## 2016-04-01 NOTE — Progress Notes (Signed)
Patient trasfered from [facility] to [phone] via wheelchair; alert and oriented x 4; complaints of generalized  Pain (MD informed); IV saline locked in LAC. Orient patient to room and unit; gave patient care guide; instructed how to use the call bell and  fall risk precautions. Will continue to monitor the patient.

## 2016-04-01 NOTE — ED Notes (Signed)
Per EMS, pt from home c/o bilateral leg wound infection. Ambulatory with EMS.

## 2016-04-01 NOTE — H&P (Signed)
Date: 04/01/2016               Patient Name:  [name] MRN: [id number]  DOB: [birth year] Age / Sex: 55 y.o., female   PCP: Zada Finders, MD         Medical Service: Internal Medicine Teaching Service         Attending Physician: Dr. Axel Filler, MD    First Contact: Dr. Benjamine Mola Pager: [phone]  Second Contact: Dr. 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 She also has ulceration of the left medial ankle. She feels that these wounds are more painful and her left ankle also has worsening swelling and drainage. She also reports a new redness and swelling in her right forearm and at the tip of her right 3rd finger. She was previously seen at wound care clinic then by home health for dressing changes, but she has been managing these wounds herself for what sounds like weeks but was not very specific. She has had subjective fevers and chills for approximately 2 days. She denies any chest pain or dyspnea. She reports some nausea but was not actively vomiting.  She reports that she has recently resumed near daily cocaine use, last 3 days ago. She has numerous recent hospitalizations for complications of her vasculitis and cocaine abuse in 04/2015 for chest pain, 09/2015 and 10/2015 for leg wound infections, 11/2015 with chest pain.  Meds: Current Facility-Administered Medications  Medication Dose Route Frequency Provider Last Rate Last  Dose  . [START ON 04/02/2016] cefTRIAXone (ROCEPHIN) 1 g in dextrose 5 % 50 mL IVPB  1 g Intravenous Q24H Jones Bales, MD      . Derrill Memo ON 04/02/2016] Chlorhexidine Gluconate Cloth 2 % PADS 6 each  6 each Topical Q0600 Axel Filler, MD      . enoxaparin (LOVENOX) injection 40 mg  40 mg Subcutaneous Q24H Jones Bales, MD   40 mg at 04/01/16 1811  . feeding supplement (BOOST / RESOURCE BREEZE) liquid 1 Container  1 Container Oral TID BM Jones Bales, MD      . feeding supplement (PRO-STAT SUGAR FREE 64) liquid 30 mL  30 mL Oral Daily Jones Bales, MD   30 mL at 04/01/16 1809  . ibuprofen (ADVIL,MOTRIN) tablet 600 mg  600 mg Oral Q6H PRN Jones Bales, MD      . multivitamin with minerals tablet 1 tablet  1 tablet Oral Daily Jones Bales, MD   1 tablet at 04/01/16 1809  . mupirocin ointment (BACTROBAN) 2 %   Nasal BID Axel Filler, MD      . Derrill Memo ON 04/02/2016] predniSONE (DELTASONE) tablet 40 mg  40 mg Oral Q breakfast Jones Bales, MD        Allergies: Allergies as of 04/01/2016 - Review Complete 04/01/2016  Allergen Reaction Noted  . Acetaminophen Swelling    Past Medical History  Diagnosis  Date  . Vasculitis ([place])     2/2 Levimasole toxicity. Followed by Dr. Louanne Skye  . Hypertension   . Cocaine abuse     ongoing with resultant vaculitis.  . Depression   . Normocytic anemia     BL Hgb 9.8-12. Last anemia panel 04/2010 - showing Fe 19, ferritin 101.  Pt on monthly B12 injections  . 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 CAP (community acquired pneumonia) 03/2014 X 2  . Daily headache   . Migraines     "probably 5-6/yr" (04/21/2014)  . Rheumatoid arthritis(714.0)     patient reported  . Inflammatory arthritis ([place])     Family History: alcohol abuse, breast cancer, hypertension  Social History: Daily cocaine use, <1/2 ppd cigarettes, does not drink  Review of Systems: A complete ROS was negative except as  per HPI. Review of Systems  Constitutional: Positive for fever and chills.  Eyes: Negative for blurred vision.  Respiratory: Negative for shortness of breath.   Cardiovascular: Negative for chest pain and leg swelling.  Gastrointestinal: Positive for nausea. Negative for vomiting and abdominal pain.  Genitourinary: Negative for dysuria.  Musculoskeletal: Positive for myalgias.  Skin: Positive for itching and rash.  Neurological: Negative for dizziness and headaches.  Endo/Heme/Allergies: Does not bruise/bleed easily.  Psychiatric/Behavioral: Positive for depression and substance abuse.    Physical Exam: Blood pressure 175/88, pulse 125, temperature 99.8 F (37.7 C), temperature source Oral, resp. rate 18, height 5' 1.5" (1.562 m), weight 45.36 kg (100 lb), SpO2 80 %. GENERAL- Patient crying profusely except when redirected, very distressed, frail thin woman HEENT- Left orbit edematous, oral mucosa appears moist, poor dentition, ears missing superior portion of helix b/l CARDIAC- Tachycardic, regular rhythm, no murmurs RESP- CTAB, no wheezes or crackles. 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 Small serosanguinous drainage on dressing without frank purulence. Malodorous smell.Very TTP  Posterior RLE: approximately 10x4cm area of scabbed over wound with scant drainage noted on dressing, no surrounding edema or erythema, moderately TTP  RUE: Medial paronychia on 3rd digit. Flexor surface of forearm is warm, erythematous, and greater in diameter compared to LUE, tender to firm palpation and wrist flexion   Assessment & Plan by Problem: Suspected Wound Infections:  Medial LLE ulcer 2/2 vasculitis now with increased edema and drainage, questionably infected. Patient has not been following anyone for wound care. There is minimal surrounding erythema.Small region of necrotic tissue on medial wound may need aggressive wound care versus surgical debridement. Her RLE wound does not look acute and is similar to previous evaluation. Her right forearm is diffusely warm and erythematous with swelling, but only mildly TTP. This does appear to be inflammatory but without purulence and does not appear to extend to her hand or elbow. May consider imaging if progresses or new concerning symptoms. PCT 1.07 unclear significance in identifying cellulitis process. - Wound care consult - Blood Cx - Empiric abtx with ceftriaxone 1g q24hrs - prednisone 61m qAM for vasculitis exacerbation  Distal Right 3nd Finger Abscess: Looks like paronychia without extension into proximal joints or hand. She has had multiple episodes along this and other digits episodically. - Warm compress - Empiric abtx as above - anticipate  I&D if not improving quickly with supportive care  Depression, chronic substance abuse: Distraught and tearful. Expresses guilt regarding relapsing cocaine use. We will consult spiritual care at her wishes.  HTN: Patient tachycardic and hypertensive to mostly in 140s-180s SBPs. She is extremely anxious and distress, crying and moving about which is likely elevating both true blood pressure and machine recording.  FULL CODE Diet: Regular VTE ppx: [place] enoxparin  Dispo: Admit patient to Observation with expected length of stay less than 2 midnights.  Signed: Collier [place], MD 04/01/2016, 7:19 PM  Pager: [phone]

## 2016-04-02 DIAGNOSIS — F17210 Nicotine dependence, cigarettes, uncomplicated: Secondary | ICD-10-CM | POA: Diagnosis present

## 2016-04-02 DIAGNOSIS — L97329 Non-pressure chronic ulcer of left ankle with unspecified severity: Secondary | ICD-10-CM | POA: Diagnosis present

## 2016-04-02 DIAGNOSIS — Z886 Allergy status to analgesic agent status: Secondary | ICD-10-CM | POA: Diagnosis not present

## 2016-04-02 DIAGNOSIS — M79605 Pain in left leg: Secondary | ICD-10-CM | POA: Diagnosis present

## 2016-04-02 DIAGNOSIS — F1420 Cocaine dependence, uncomplicated: Secondary | ICD-10-CM | POA: Diagnosis present

## 2016-04-02 DIAGNOSIS — Z7952 Long term (current) use of systemic steroids: Secondary | ICD-10-CM | POA: Diagnosis not present

## 2016-04-02 DIAGNOSIS — F329 Major depressive disorder, single episode, unspecified: Secondary | ICD-10-CM | POA: Diagnosis present

## 2016-04-02 DIAGNOSIS — B9689 Other specified bacterial agents as the cause of diseases classified elsewhere: Secondary | ICD-10-CM | POA: Diagnosis not present

## 2016-04-02 DIAGNOSIS — L03011 Cellulitis of right finger: Secondary | ICD-10-CM | POA: Diagnosis present

## 2016-04-02 DIAGNOSIS — L98499 Non-pressure chronic ulcer of skin of other sites with unspecified severity: Secondary | ICD-10-CM | POA: Diagnosis present

## 2016-04-02 DIAGNOSIS — L958 Other vasculitis limited to the skin: Secondary | ICD-10-CM | POA: Diagnosis present

## 2016-04-02 DIAGNOSIS — L03116 Cellulitis of left lower limb: Secondary | ICD-10-CM | POA: Diagnosis present

## 2016-04-02 DIAGNOSIS — I10 Essential (primary) hypertension: Secondary | ICD-10-CM | POA: Diagnosis present

## 2016-04-02 DIAGNOSIS — L03113 Cellulitis of right upper limb: Secondary | ICD-10-CM | POA: Diagnosis present

## 2016-04-02 DIAGNOSIS — L959 Vasculitis limited to the skin, unspecified: Secondary | ICD-10-CM | POA: Diagnosis not present

## 2016-04-02 DIAGNOSIS — T374X5A Adverse effect of anthelminthics, initial encounter: Secondary | ICD-10-CM | POA: Diagnosis present

## 2016-04-02 DIAGNOSIS — L97219 Non-pressure chronic ulcer of right calf with unspecified severity: Secondary | ICD-10-CM | POA: Diagnosis present

## 2016-04-02 DIAGNOSIS — Z791 Long term (current) use of non-steroidal anti-inflammatories (NSAID): Secondary | ICD-10-CM | POA: Diagnosis not present

## 2016-04-02 DIAGNOSIS — M069 Rheumatoid arthritis, unspecified: Secondary | ICD-10-CM | POA: Diagnosis present

## 2016-04-02 DIAGNOSIS — D649 Anemia, unspecified: Secondary | ICD-10-CM | POA: Diagnosis present

## 2016-04-02 DIAGNOSIS — F14288 Cocaine dependence with other cocaine-induced disorder: Secondary | ICD-10-CM | POA: Diagnosis not present

## 2016-04-02 LAB — COMPREHENSIVE METABOLIC PANEL
ALT: 10 U/L — ABNORMAL LOW (ref 14–54)
AST: 17 U/L (ref 15–41)
Albumin: 2.8 g/dL — ABNORMAL LOW (ref 3.5–5.0)
Alkaline Phosphatase: 76 U/L (ref 38–126)
Anion gap: 8 (ref 5–15)
BUN: 14 mg/dL (ref 6–20)
CHLORIDE: 108 mmol/L (ref 101–111)
CO2: 23 mmol/L (ref 22–32)
CREATININE: 0.69 mg/dL (ref 0.44–1.00)
Calcium: 8.8 mg/dL — ABNORMAL LOW (ref 8.9–10.3)
Glucose, Bld: 94 mg/dL (ref 65–99)
POTASSIUM: 3.7 mmol/L (ref 3.5–5.1)
Sodium: 139 mmol/L (ref 135–145)
Total Bilirubin: 0.4 mg/dL (ref 0.3–1.2)
Total Protein: 6.6 g/dL (ref 6.5–8.1)

## 2016-04-02 LAB — HIV ANTIBODY (ROUTINE TESTING W REFLEX): HIV SCREEN 4TH GENERATION: NONREACTIVE

## 2016-04-02 MED ORDER — CEFAZOLIN IN D5W 1 GM/50ML IV SOLN
1.0000 g | Freq: Three times a day (TID) | INTRAVENOUS | Status: DC
  Administered 2016-04-02 – 2016-04-04 (×6): 1 g via INTRAVENOUS
  Filled 2016-04-02 (×8): qty 50

## 2016-04-02 MED ORDER — OXYCODONE HCL 5 MG PO TABS
5.0000 mg | ORAL_TABLET | ORAL | Status: DC | PRN
  Administered 2016-04-02 – 2016-04-04 (×7): 5 mg via ORAL
  Filled 2016-04-02 (×7): qty 1

## 2016-04-02 MED ORDER — MUPIROCIN CALCIUM 2 % EX CREA
TOPICAL_CREAM | Freq: Every day | CUTANEOUS | Status: DC
  Administered 2016-04-02 – 2016-04-04 (×3): via TOPICAL
  Filled 2016-04-02: qty 15

## 2016-04-02 NOTE — Consult Note (Addendum)
WOC wound consult note This patient is well known to our department from her multiple previous admissions. She presents with levamisole vasculitis with a history of multiple wounds to ears and legs. She admits she is still using drugs and is aware that these wounds are the result of a component in the drugs. Reason for Consult:LE ulcers Wound type:Levamisole vasculitis Bilat ears with dry stable eschar; left 1X.2cm, right 1X.2cm Measurement:Full thickness wound to left leg 5.5X4.5X.2cm, dark red and moist, mod amt tan drainage, no odor. Right posterior leg with 2 full thickness wounds; 4.5X3.5X.3cm and 2X3X.2cm, both 80% dark red, 20% slough, mod amt red drainage, strong odor. Dressing procedure/placement/frequency: Patient has low tolerance for any wound contact layer suggested related to pain: [place] team has tried multiple products in the past including non-adherent gauze, silicone, petrolatum and oil emulsion. Today she requested to remove the old dressing herself rather than have someone else perform that task. She is sensitive to saline cleanse and will tolerate only non-adherent xeroform gauze; pt prefers to perform her own dressing changes and has been doing this prior to admission. Bactroban to ear eschar daily. Discussed plan of care with patient and she verbalized understanding. Please re-consult if further assistance is needed.  Thank-you,  Julien Girt MSN, [place], [place], [place], [place]

## 2016-04-02 NOTE — Progress Notes (Signed)
   04/02/16 1400  Clinical Encounter Type  Visited With Patient not available  Visit Type Spiritual support  Patient sleeping soundly, did not respond to calling her name. Will return later. Tymir Terral, Chaplain

## 2016-04-02 NOTE — Progress Notes (Signed)
Subjective: Her pain and distress are partially improved from yesterday. She denies ongoing fever or chills. Continues to have significant pain with any manipulation or dressing change of her leg wounds.  Objective: Vital signs in last 24 hours: Filed Vitals:   04/01/16 1846 04/01/16 2224 04/02/16 0555 04/02/16 1331  BP: 175/88 161/77 140/71 134/68  Pulse: 125 96 73 76  Temp:  99.4 F (37.4 C) 98.1 F (36.7 C) 98.6 F (37 C)  TempSrc:      Resp:  16 16 15   Height:      Weight:      SpO2:  99% 100% 98%   Weight change:   Intake/Output Summary (Last 24 hours) at 04/02/16 1342 Last data filed at 04/02/16 1300  Gross per 24 hour  Intake    873 ml  Output    550 ml  Net    323 ml   GENERAL- No acute distress, very thin woman CARDIAC- Tachycardic, regular rhythm, no murmurs RESP- CTAB, no wheezes or crackles. 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 Posterior RLE: approximately 8x4cm area of wound with scant drainage noted on dressing, malodorous, mostly dark red base with some slough present RUE: Third digit with small ulcers more consistent with vasculitic ulcers, forearm erythema without severe swelling or warmth, painful on wrist flexion  Lab Results: Basic Metabolic Panel:  Recent Labs Lab 04/01/16 1220 04/02/16 0723  NA 134* 139  K 3.8 3.7  CL 101 108  CO2 22 23  GLUCOSE 98 94  BUN 21* 14  CREATININE 0.89 0.69  CALCIUM 8.9 8.8*   Liver Function Tests:  Recent Labs Lab 04/01/16 1220 04/02/16 0723  AST 18 17  ALT 11* 10*  ALKPHOS 77 76  BILITOT 0.7 0.4  PROT 7.8 6.6  ALBUMIN 3.6 2.8*   No results for input(s): LIPASE, AMYLASE in the last 168 hours. No results for input(s): AMMONIA in the last 168 hours. CBC:  Recent Labs Lab 04/01/16 1220  WBC 7.3  NEUTROABS 6.3    HGB 12.8  HCT 35.9*  MCV 80.1  PLT 294   Urine Drug Screen: Drugs of Abuse     Component Value Date/Time   LABOPIA POSITIVE* 04/01/2016 1508   LABOPIA PPS 08/31/2012 1045   COCAINSCRNUR POSITIVE* 04/01/2016 1508   COCAINSCRNUR NEG 08/31/2012 1045   LABBENZ NONE DETECTED 04/01/2016 1508   LABBENZ NEG 08/31/2012 1045   LABBENZ NEGATIVE 06/12/2011 0221   AMPHETMU NONE DETECTED 04/01/2016 1508   AMPHETMU NEG 08/31/2012 1045   AMPHETMU NEGATIVE 06/12/2011 0221   THCU NONE DETECTED 04/01/2016 1508   THCU NEG 08/31/2012 1045   THCU 63.4* 05/20/2012 1405   LABBARB NONE DETECTED 04/01/2016 1508   LABBARB NEG 08/31/2012 1045   Urinalysis:  Recent Labs Lab 04/01/16 1508  COLORURINE YELLOW  LABSPEC 1.021  PHURINE 5.5  GLUCOSEU NEGATIVE  HGBUR SMALL*  BILIRUBINUR NEGATIVE  KETONESUR NEGATIVE  PROTEINUR 30*  NITRITE NEGATIVE  LEUKOCYTESUR NEGATIVE   Micro Results: Recent Results (from the past 240 hour(s))  Blood culture (routine x 2)     Status: None (Preliminary result)   Collection Time: 04/01/16 12:20 PM  Result Value Ref Range Status   Specimen Description BLOOD LEFT ARM  Final   Special Requests BOTTLES DRAWN AEROBIC AND ANAEROBIC 5CC  Final   Culture   Final  NO GROWTH < 24 HOURS Performed at [facility]    Report Status PENDING  Incomplete  Blood culture (routine x 2)     Status: None (Preliminary result)   Collection Time: 04/01/16 12:45 PM  Result Value Ref Range Status   Specimen Description BLOOD LEFT ARM  Final   Special Requests BOTTLES DRAWN AEROBIC AND ANAEROBIC 5ML  Final   Culture   Final    NO GROWTH < 24 HOURS Performed at [facility]    Report Status PENDING  Incomplete  MRSA PCR Screening     Status: Abnormal   Collection Time: 04/01/16  4:41 PM  Result Value Ref Range Status   MRSA by PCR POSITIVE (A) NEGATIVE Final    Comment:        The GeneXpert MRSA Assay (FDA approved for NASAL specimens only), is one component of  a comprehensive MRSA colonization surveillance program. It is not intended to diagnose MRSA infection nor to guide or monitor treatment for MRSA infections. RESULT CALLED TO, READ BACK BY AND VERIFIED WITH: G.SCACL RN AT [id number] ON 04/01/16 BY A.DAVIS    Studies/Results: No results found. Medications: I have reviewed the patient's current medications. Scheduled Meds: .  ceFAZolin (ANCEF) IV  1 g Intravenous Q8H  . Chlorhexidine Gluconate Cloth  6 each Topical Q0600  . enoxaparin (LOVENOX) injection  40 mg Subcutaneous Q24H  . feeding supplement  1 Container Oral TID BM  . feeding supplement (PRO-STAT SUGAR FREE 64)  30 mL Oral Daily  . multivitamin with minerals  1 tablet Oral Daily  . mupirocin cream   Topical Daily  . mupirocin ointment   Nasal BID  . predniSONE  40 mg Oral Q breakfast   Continuous Infusions:  PRN Meds:.ibuprofen, oxyCODONE Assessment/Plan: Suspected Wound Infections: Medial LLE is suspicious for superimposed wound infection so will empirically treat for cellulitis. Her right forearm may also be a mild nonpurulent cellulitis which will be covered appropriately. Anticipate starting IV antibiotics until symptoms improvement with oral antibiotics in the future. Fortunately she is not having systemic symptoms and fever already improved. Local infections do not appear severe enough to require debridement which is very good since this could exacerbate her ongoing vasculitis. Wound care evaluation is appreciated and will plan on routine wound care and will need further assistance at discharge. - F/U Blood Cx - Resume routine wound care - Empiric abtx with Ancef for a mderate cellulitis without large purulence  Exacerbation of chronic vasculitic ulcers: Worsened pain on bilateral legs with resumption of daily cocaine use. Distal R 3rd finger ulcers also appear to be part of same process. We will start oral steroids here with plan for taper on discharge with recommendation as  always to reduce cocaine use since this is the presumed cause and certainly exacerbator of her symptoms. -Prednisone 80m daily -oxycodone 568mPRN  FULL CODE Diet: Regular VTE ppx: [place] enoxaparin  Dispo: Admit patient to Observation with expected length of stay less than 2 midnights.   LOS: 0 days   ChCollier SalinaMD 04/02/2016, 1:42 PM

## 2016-04-03 DIAGNOSIS — L959 Vasculitis limited to the skin, unspecified: Secondary | ICD-10-CM

## 2016-04-03 NOTE — Progress Notes (Signed)
Subjective: She reports some subjective chills but no recorded fever overnight. Continues to have significant pain with any manipulation or dressing change of her bilateral leg wounds. She reports a small amount of spontaneous drainage from her right middle finger that has stopped.  Objective: Vital signs in last 24 hours: Filed Vitals:   04/02/16 0555 04/02/16 1331 04/02/16 2206 04/03/16 0540  BP: 140/71 134/68 152/81 152/86  Pulse: 73 76 86 65  Temp: 98.1 F (36.7 C) 98.6 F (37 C) 98.3 F (36.8 C) 97.8 F (36.6 C)  TempSrc:   Oral Oral  Resp: 16 15 14 18   Height:      Weight:      SpO2: 100% 98% 100% 100%   Weight change:   Intake/Output Summary (Last 24 hours) at 04/03/16 1408 Last data filed at 04/03/16 1344  Gross per 24 hour  Intake    780 ml  Output    350 ml  Net    430 ml   GENERAL- No acute distress, very thin woman CARDIAC- RRR, no murmurs RESP- CTAB, no wheezes or crackles. 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 Posterior RLE: dressing intact without large drainage noted RUE: Forearm erythema improved, nontender to palpation and wrist movement  Lab Results: Basic Metabolic Panel:  Recent Labs Lab 04/01/16 1220 04/02/16 0723  NA 134* 139  K 3.8 3.7  CL 101 108  CO2 22 23  GLUCOSE 98 94  BUN 21* 14  CREATININE 0.89 0.69  CALCIUM 8.9 8.8*   Liver Function Tests:  Recent Labs Lab 04/01/16 1220 04/02/16 0723  AST 18 17  ALT 11* 10*  ALKPHOS 77 76  BILITOT 0.7 0.4  PROT 7.8 6.6  ALBUMIN 3.6 2.8*   CBC:  Recent Labs Lab 04/01/16 1220  WBC 7.3  NEUTROABS 6.3  HGB 12.8  HCT 35.9*  MCV 80.1  PLT 294   Urine Drug Screen: Drugs of Abuse     Component Value Date/Time   LABOPIA POSITIVE* 04/01/2016 1508   LABOPIA PPS  08/31/2012 1045   COCAINSCRNUR POSITIVE* 04/01/2016 1508   COCAINSCRNUR NEG 08/31/2012 1045   LABBENZ NONE DETECTED 04/01/2016 1508   LABBENZ NEG 08/31/2012 1045   LABBENZ NEGATIVE 06/12/2011 0221   AMPHETMU NONE DETECTED 04/01/2016 1508   AMPHETMU NEG 08/31/2012 1045   AMPHETMU NEGATIVE 06/12/2011 0221   THCU NONE DETECTED 04/01/2016 1508   THCU NEG 08/31/2012 1045   THCU 63.4* 05/20/2012 1405   LABBARB NONE DETECTED 04/01/2016 1508   LABBARB NEG 08/31/2012 1045   Urinalysis:  Recent Labs Lab 04/01/16 1508  COLORURINE YELLOW  LABSPEC 1.021  PHURINE 5.5  GLUCOSEU NEGATIVE  HGBUR SMALL*  BILIRUBINUR NEGATIVE  KETONESUR NEGATIVE  PROTEINUR 30*  NITRITE NEGATIVE  LEUKOCYTESUR NEGATIVE   Micro Results: Recent Results (from the past 240 hour(s))  Blood culture (routine x 2)     Status: None (Preliminary result)   Collection Time: 04/01/16 12:20 PM  Result Value Ref Range Status   Specimen Description BLOOD LEFT ARM  Final   Special Requests BOTTLES DRAWN AEROBIC AND ANAEROBIC 5CC  Final   Culture   Final    NO GROWTH < 24 HOURS Performed at [facility]    Report Status PENDING  Incomplete  Blood culture (routine x 2)     Status:  None (Preliminary result)   Collection Time: 04/01/16 12:45 PM  Result Value Ref Range Status   Specimen Description BLOOD LEFT ARM  Final   Special Requests BOTTLES DRAWN AEROBIC AND ANAEROBIC 5ML  Final   Culture   Final    NO GROWTH < 24 HOURS Performed at [facility]    Report Status PENDING  Incomplete  MRSA PCR Screening     Status: Abnormal   Collection Time: 04/01/16  4:41 PM  Result Value Ref Range Status   MRSA by PCR POSITIVE (A) NEGATIVE Final    Comment:        The GeneXpert MRSA Assay (FDA approved for NASAL specimens only), is one component of a comprehensive MRSA colonization surveillance program. It is not intended to diagnose MRSA infection nor to guide or monitor treatment for MRSA  infections. RESULT CALLED TO, READ BACK BY AND VERIFIED WITH: G.SCACL RN AT 7106 ON 04/01/16 BY A.DAVIS    Studies/Results: No results found. Medications: I have reviewed the patient's current medications. Scheduled Meds: .  ceFAZolin (ANCEF) IV  1 g Intravenous Q8H  . Chlorhexidine Gluconate Cloth  6 each Topical Q0600  . enoxaparin (LOVENOX) injection  40 mg Subcutaneous Q24H  . feeding supplement  1 Container Oral TID BM  . feeding supplement (PRO-STAT SUGAR FREE 64)  30 mL Oral Daily  . multivitamin with minerals  1 tablet Oral Daily  . mupirocin cream   Topical Daily  . mupirocin ointment   Nasal BID  . predniSONE  40 mg Oral Q breakfast   Continuous Infusions:  PRN Meds:.ibuprofen, oxyCODONE Assessment/Plan: Vasculitic Wound Infections: Improvement in RUE erythema, difficult to tell purulence from liquifying necrosis in lower extremity ulcer drainage. Plan to continue IV antibiotics given clinical improvement to treatment so far. May be able to transition to oral treatment tomorrow as she remains afebrile and blood cultures NGTD - F/U Blood Cx - Resume routine wound care - Empiric abtx with Ancef for a moderate cellulitis, probably polymicrobial  Exacerbation of chronic vasculitic ulcers: Right 3rd finger ulceration moderately improving. Continues to have pain with severe tenderness to manipulation of her leg wounds. 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 She has history of a previously negative study for cryoglobulin in July 2011 that I was not able to review at this time. She was tested and negative for HCV, HBV, or Hep A in 04/2014. HIV test negative in 03/2016. She has a history of variably positive ANA titers dating 2010-2012. 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 -Prednisone 41m daily -oxycodone  557mPRN -May test for cryoglobulin as alternate exacerbator of chronic vasculitis.  FULL CODE Diet: Regular VTE ppx: [place] enoxaparin  Dispo: Admit patient to Observation with expected length of stay less than 2 midnights.   LOS: 1 day   ChCollier SalinaMD 04/03/2016, 2:08 PM

## 2016-04-04 DIAGNOSIS — F14288 Cocaine dependence with other cocaine-induced disorder: Secondary | ICD-10-CM

## 2016-04-04 DIAGNOSIS — L03116 Cellulitis of left lower limb: Secondary | ICD-10-CM

## 2016-04-04 DIAGNOSIS — L958 Other vasculitis limited to the skin: Principal | ICD-10-CM

## 2016-04-04 DIAGNOSIS — B9689 Other specified bacterial agents as the cause of diseases classified elsewhere: Secondary | ICD-10-CM

## 2016-04-04 LAB — CBC
HEMATOCRIT: 30.5 % — AB (ref 36.0–46.0)
HEMOGLOBIN: 9.9 g/dL — AB (ref 12.0–15.0)
MCH: 27.1 pg (ref 26.0–34.0)
MCHC: 32.5 g/dL (ref 30.0–36.0)
MCV: 83.6 fL (ref 78.0–100.0)
Platelets: 365 10*3/uL (ref 150–400)
RBC: 3.65 MIL/uL — ABNORMAL LOW (ref 3.87–5.11)
RDW: 14.1 % (ref 11.5–15.5)
WBC: 11.9 10*3/uL — ABNORMAL HIGH (ref 4.0–10.5)

## 2016-04-04 LAB — HEPATITIS C ANTIBODY (REFLEX): HCV Ab: <0.1 s/co ratio (ref 0.0–0.9)

## 2016-04-04 LAB — HCV COMMENT:

## 2016-04-04 MED ORDER — PREDNISONE 20 MG PO TABS: 40.0000 mg | ORAL_TABLET | Freq: Every day | ORAL | Status: DC

## 2016-04-04 MED ORDER — "XEROFORM PETROLAT GAUZE 5""X9"" EX MISC": CUTANEOUS | Status: DC

## 2016-04-04 MED ORDER — CEPHALEXIN 500 MG PO CAPS
500.0000 mg | ORAL_CAPSULE | Freq: Two times a day (BID) | ORAL | Status: DC
  Administered 2016-04-04: 500 mg via ORAL
  Filled 2016-04-04: qty 1

## 2016-04-04 MED ORDER — CEPHALEXIN 500 MG PO CAPS: 500.0000 mg | ORAL_CAPSULE | Freq: Two times a day (BID) | ORAL | Status: DC

## 2016-04-04 NOTE — Progress Notes (Signed)
   Subjective: Doing well this morning. Feels better overall. Pain improved.  Objective: Vital signs in last 24 hours: Filed Vitals:   04/03/16 0540 04/03/16 1502 04/03/16 2241 04/04/16 0536  BP: 152/86 149/72 156/75 170/74  Pulse: 65 74 83 63  Temp: 97.8 F (36.6 C) 98.4 F (36.9 C)  99.1 F (37.3 C)  TempSrc: Oral   Oral  Resp: 18 17  18   Height:      Weight:      SpO2: 100% 97% 100% 100%   Physical Exam General Apperance: NAD HEENT: Normocephalic, atraumatic, anicteric sclera Neck: Supple, trachea midline Lungs: Clear to auscultation bilaterally. No wheezes, rhonchi or rales. Breathing comfortably Heart: Regular rate and rhythm, no murmur/rub/gallop Abdomen: Soft, nontender, nondistended, no rebound/guarding Extremities: Warm and well perfused, no edema Skin:  Medial LLE: approximately 4x4 cm wound with some tan drainage and small blood present on dressing. Posterior RLE: dressing intact without large drainage noted RUE: Forearm erythema improved, nontender to palpation and wrist movement Neurologic: Alert and interactive. No gross deficits.  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  Acute on chronic cutaneous vasculitis with superinfection of LLE wound: Hep C Ab neg, unlikely to be related to cryoglobulinemia. -Continue prednisone 6m daily with discharge with additional two weeks. Will need to follow up in our clinic to determine further course of prednisone. Short-term glucocorticoid therapy (up to three weeks), can simply be stopped and need not be tapered. However, if she does not have much improvement in her wounds would need to consider longer course of steroids with a long taper. -Transition Ancef to Keflex -Continue local wound care with xeroform gauze  Dispo: Likely home today    LOS: 2 days   JMilagros Loll MD 04/04/2016, 10:31  AM Pager: [phone]

## 2016-04-04 NOTE — Discharge Summary (Signed)
Name: [name] MRN: [id number] DOB: 20-Apr-1961 55 y.o. PCP: Zada Finders, MD  Date of Admission: 04/01/2016 11:00 AM Date of Discharge: 04/04/2016 Attending Physician: Axel Filler, MD  Discharge Diagnosis: 1. Cellulitis 2. 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     ibuprofen 200 MG tablet  Commonly known as:  ADVIL,MOTRIN  Take 200-800 mg by mouth every 6 (six) hours as needed for headache or moderate pain.     multivitamin with minerals Tabs tablet  Take 1 tablet by mouth daily.     predniSONE 20 MG tablet  Commonly known as:  DELTASONE  Take 2 tablets (40 mg total) by mouth daily with breakfast.        Disposition and follow-up:   [name] was discharged from [facility] in Stable condition.  At the hospital follow up visit please address:  1.  Acute on chronic cutaneous vasculitis with superinfection of LLE wound: Please determine whether [name] needs to have a prolonged course of steroids with taper. Would need refill of prednisone in that case. Please ensure she finished her course of Keflex.  2.  Labs / imaging needed at time of follow-up: None  3.  Pending labs/ test needing follow-up: BCx from 6/28  Follow-up Appointments:     Follow-up Information    Schedule an appointment as soon as possible for a visit with Zada Finders, MD.   Specialty:  Internal Medicine   Why:  For hospital follow up in 1-2 weeks.   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 She was seen in consultation by wound care and started on ceftraixone. She was also started on prednisone 64m daily. She was provided xeroform gauze for dressing changings. Her antibiotics were changed to Ancef on 6/29. She was stable for discharge on 7/1. She was discharged on prednisone 443mdaily and Keflex 50031mID (end 7/5). Will have her follow up at the internal medicine center.  Discharge Vitals:   BP 170/74 mmHg  Pulse 63  Temp(Src) 99.1 F (37.3 C) (Oral)  Resp 18  Ht 5' 1.5" (1.562 m)  Wt 100 lb (45.36 kg)  BMI 18.59 kg/m2  SpO2 100%  Pertinent Labs, Studies, and Procedures:  HCV Ab 6/302-Jul-2017.1 HIV 04/01/2016 Non reactive  Discharge Instructions: Discharge Instructions    Call MD for:  difficulty breathing, headache or visual disturbances    Complete by:  As directed      Call MD for:  persistant dizziness or light-headedness    Complete by:  As directed  Call MD for:  persistant nausea and vomiting    Complete by:  As directed      Call MD for:  temperature >100.4    Complete by:  As directed      Change dressing (specify)    Complete by:  As directed   Dressing change: 1-2 times per day using xeroform gauze.     Diet - low sodium heart healthy    Complete by:  As directed      Discharge instructions    Complete by:  As directed   Please continue to take prednisone daily (two tablets) and follow up with our clinic to determine further dosing. Please take your antibiotic, Keflex (cephalexin) twice a day. Follow up in our clinic in 1-2 weeks.     Increase activity slowly    Complete by:  As directed             Signed: Milagros Loll, MD 04/04/2016, 12:38 PM   Pager: [phone]

## 2016-04-04 NOTE — Progress Notes (Signed)
[name] to be D/C'd to home per MD order.  Discussed with the patient and all questions fully answered.  VSS, Skin clean, dry and intact without evidence of skin break down, no evidence of skin tears noted. IV catheter discontinued intact. Site without signs and symptoms of complications. Dressing and pressure applied.  An After Visit Summary was printed and given to the patient. Patient received prescriptions.  D/c education completed with patient/family including follow up instructions, medication list, d/c activities limitations if indicated, with other d/c instructions as indicated by MD - patient able to verbalize understanding, all questions fully answered.   Patient instructed to return to ED, call 911, or call MD for any changes in condition.   Patient escorted via [place], and D/C home via private auto.  Morley Kos Price 04/04/2016 1:59 PM

## 2016-04-06 LAB — CULTURE, BLOOD (ROUTINE X 2)
CULTURE: NO GROWTH
Culture: NO GROWTH

## 2016-04-15 ENCOUNTER — Other Ambulatory Visit: Payer: Self-pay | Admitting: Internal Medicine

## 2016-04-15 ENCOUNTER — Ambulatory Visit (INDEPENDENT_AMBULATORY_CARE_PROVIDER_SITE_OTHER): Payer: Medicaid Other | Admitting: Internal Medicine

## 2016-04-15 VITALS — BP 181/93 | HR 76 | Temp 97.9°F | Ht 61.5 in | Wt 96.1 lb

## 2016-04-15 DIAGNOSIS — F17210 Nicotine dependence, cigarettes, uncomplicated: Secondary | ICD-10-CM

## 2016-04-15 DIAGNOSIS — Z1231 Encounter for screening mammogram for malignant neoplasm of breast: Secondary | ICD-10-CM

## 2016-04-15 DIAGNOSIS — F14988 Cocaine use, unspecified with other cocaine-induced disorder: Secondary | ICD-10-CM

## 2016-04-15 DIAGNOSIS — I10 Essential (primary) hypertension: Secondary | ICD-10-CM | POA: Diagnosis not present

## 2016-04-15 DIAGNOSIS — Z0000 Encounter for general adult medical examination without abnormal findings: Secondary | ICD-10-CM

## 2016-04-15 DIAGNOSIS — Z5189 Encounter for other specified aftercare: Secondary | ICD-10-CM

## 2016-04-15 DIAGNOSIS — I999 Unspecified disorder of circulatory system: Secondary | ICD-10-CM

## 2016-04-15 DIAGNOSIS — F14288 Cocaine dependence with other cocaine-induced disorder: Secondary | ICD-10-CM

## 2016-04-15 DIAGNOSIS — L958 Other vasculitis limited to the skin: Secondary | ICD-10-CM

## 2016-04-15 MED ORDER — LISINOPRIL 10 MG PO TABS: 10.0000 mg | ORAL_TABLET | Freq: Every day | ORAL | Status: DC

## 2016-04-15 MED ORDER — NAPROXEN SODIUM 220 MG PO TABS
220.0000 mg | ORAL_TABLET | Freq: Three times a day (TID) | ORAL | Status: DC
Start: 2016-04-15 — End: 2016-05-01

## 2016-04-15 NOTE — Assessment & Plan Note (Signed)
A/P: Due for mammogram and colonoscopy. Patient declines referral for colonoscopy but will go for mammogram. -Mammogram referral placed -Readdress Colonoscopy on future visits

## 2016-04-15 NOTE — Patient Instructions (Signed)
Please keep your dressing on for 7 days. We have given you supplies for home use to use until you are able to follow up with wound care. Please follow up with them when an appointment is given.  I have sent a prescription for Aleve 220 mg to take three times a day with meals for pain.  I have sent a prescription of Lisinopril 10 mg to your pharmacy for your blood pressure.  You have an appointment for your Mammogram on July 18th. Please arrive at 1:00 pm.  You may finish the Prednisone you have, but do not need to continue this afterwards.

## 2016-04-15 NOTE — Assessment & Plan Note (Signed)
A/P: Wounds without infected appearance or active pustular drainage. We have redressed wounds today with Hydrogel for 7 days and given home supplies. Will refer back to Wound Care. No antibiotics indicated at this time. Advised patient to complete her Prednisone course without taper as she has been taking this <3 weeks. -Redressed wounds with Hydrogel -Home supplies given for next 1-2 weeks -Refer back to Wound Care -Complete current Prednisone course, no further steroids at this time -Continue cocaine cessation counseling

## 2016-04-15 NOTE — Progress Notes (Signed)
[name], female   DOB: 29-Sep-1961, 55 y.o.   MRN: [id number]   CC: Vasculitis and hospital follow up  HPI: [name] is a 55 year old female with PMH as listed below who presents for routine and hospital follow up for management of her levamisole vasculitis and HTN.  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 She was treated with antibiotics in the hospital, initially Ceftriaxone then Cefazolin and discharged on oral Keflex, which she states she may have missed a day as she has a few pills left. Keflex was anticipated to end on 7/5. She was also started on Prednisone 40 mg in the hospital and discharged with 2 weeks supply for which she has about 2 days supply left. She has been changing the dressing on her wounds at home herself twice a day. She feels that the wound is improving and does not note any pustular drainage. She is requesting non-adherent dressings as the gauze she is using sticks to her wounds. She has previously followed with the wound center and had home health nursing bring supplies, which she no longer has. Hep C antibody and HIV testing were negative during her admission. She reports last cocaine use about 3 days ago.  Blood pressure today is 181/93. She was previously on Lisinopril in the past for HTN, but is unsure why this was stopped.  Patient last had a Mammogram in 04/2014 and is open to screening Mammogram as she is due. She denies Colonoscopy referral at this time.    [name] is a 55 y.o.   Past Medical History  Diagnosis Date  . Vasculitis ([place])     2/2 Levimasole toxicity. Followed by Dr. Louanne Skye  . Hypertension   . Cocaine abuse     ongoing with resultant vaculitis.  . Depression   . Normocytic anemia     BL Hgb 9.8-12. Last anemia panel 04/2010 - showing Fe 19, ferritin 101.  Pt  on monthly B12 injections  . 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 CAP (community acquired pneumonia) 03/2014 X 2  . Daily headache   . Migraines     "probably 5-6/yr" (04/21/2014)  . Rheumatoid arthritis(714.0)     patient reported  . Inflammatory arthritis ([place])     Review of Systems:   Review of Systems  Constitutional: Negative for fever and chills.  Skin: Positive for itching and rash.       No pustular drainage from leg wounds, painful to touch.  Psychiatric/Behavioral: Positive for substance abuse.       Cocaine use about 3 days ago.     Physical Exam: Physical Exam  Constitutional: She is oriented to person, place, and time.  Cachectic appearing woman, tearful  Neurological: She is alert and oriented to person, place, and time.  Skin:  Non-purulent vasculitic ulcers without granulation tissue on left medial malleolus and two on posterior RLE. Wound dressing with dry blood. No active bleeding or purulent drainage, some serosanguinous fluid present. Guarding to touch, prefers to remove wound dressing herself.   Filed Vitals:   04/15/16 1326  BP: 181/93  Pulse: 76  Temp: 97.9 F (36.6 C)  TempSrc: Oral  Height: 5' 1.5" (1.562 m)  Weight: 96 lb 1.6 oz (43.591 kg)  SpO2: 100%  LLE:  RLE:     Assessment & Plan:   See encounters tab for problem based medical decision making.   Patient discussed with Dr. Angelia Mould

## 2016-04-15 NOTE — Assessment & Plan Note (Signed)
BP Readings from Last 3 Encounters:  04/15/16 181/93  04/04/16 170/74  03/14/16 179/79    A/P: BP elevated, not currently taking medications for this. Previously on Lisinopril. Renal function is stable with last SCr 0.69. Will restart Lisinopril at this time. -Restart Lisinopril 10 mg daily -Consider HCTZ addition in future if not well controlled

## 2016-04-16 NOTE — Progress Notes (Signed)
Internal Medicine Clinic Attending  Case discussed with Dr. Patel,Vishal at the time of the visit.  We reviewed the resident's history and exam and pertinent patient test results.  I agree with the assessment, diagnosis, and plan of care documented in the resident's note.  

## 2016-04-20 IMAGING — CR DG CHEST 2V
2 series · 2 of 2 positions shown · non-contrast
Comparison: 04/16/2014

CLINICAL DATA: Chest pain

EXAM:
CHEST  2 VIEW

[w chest pa]
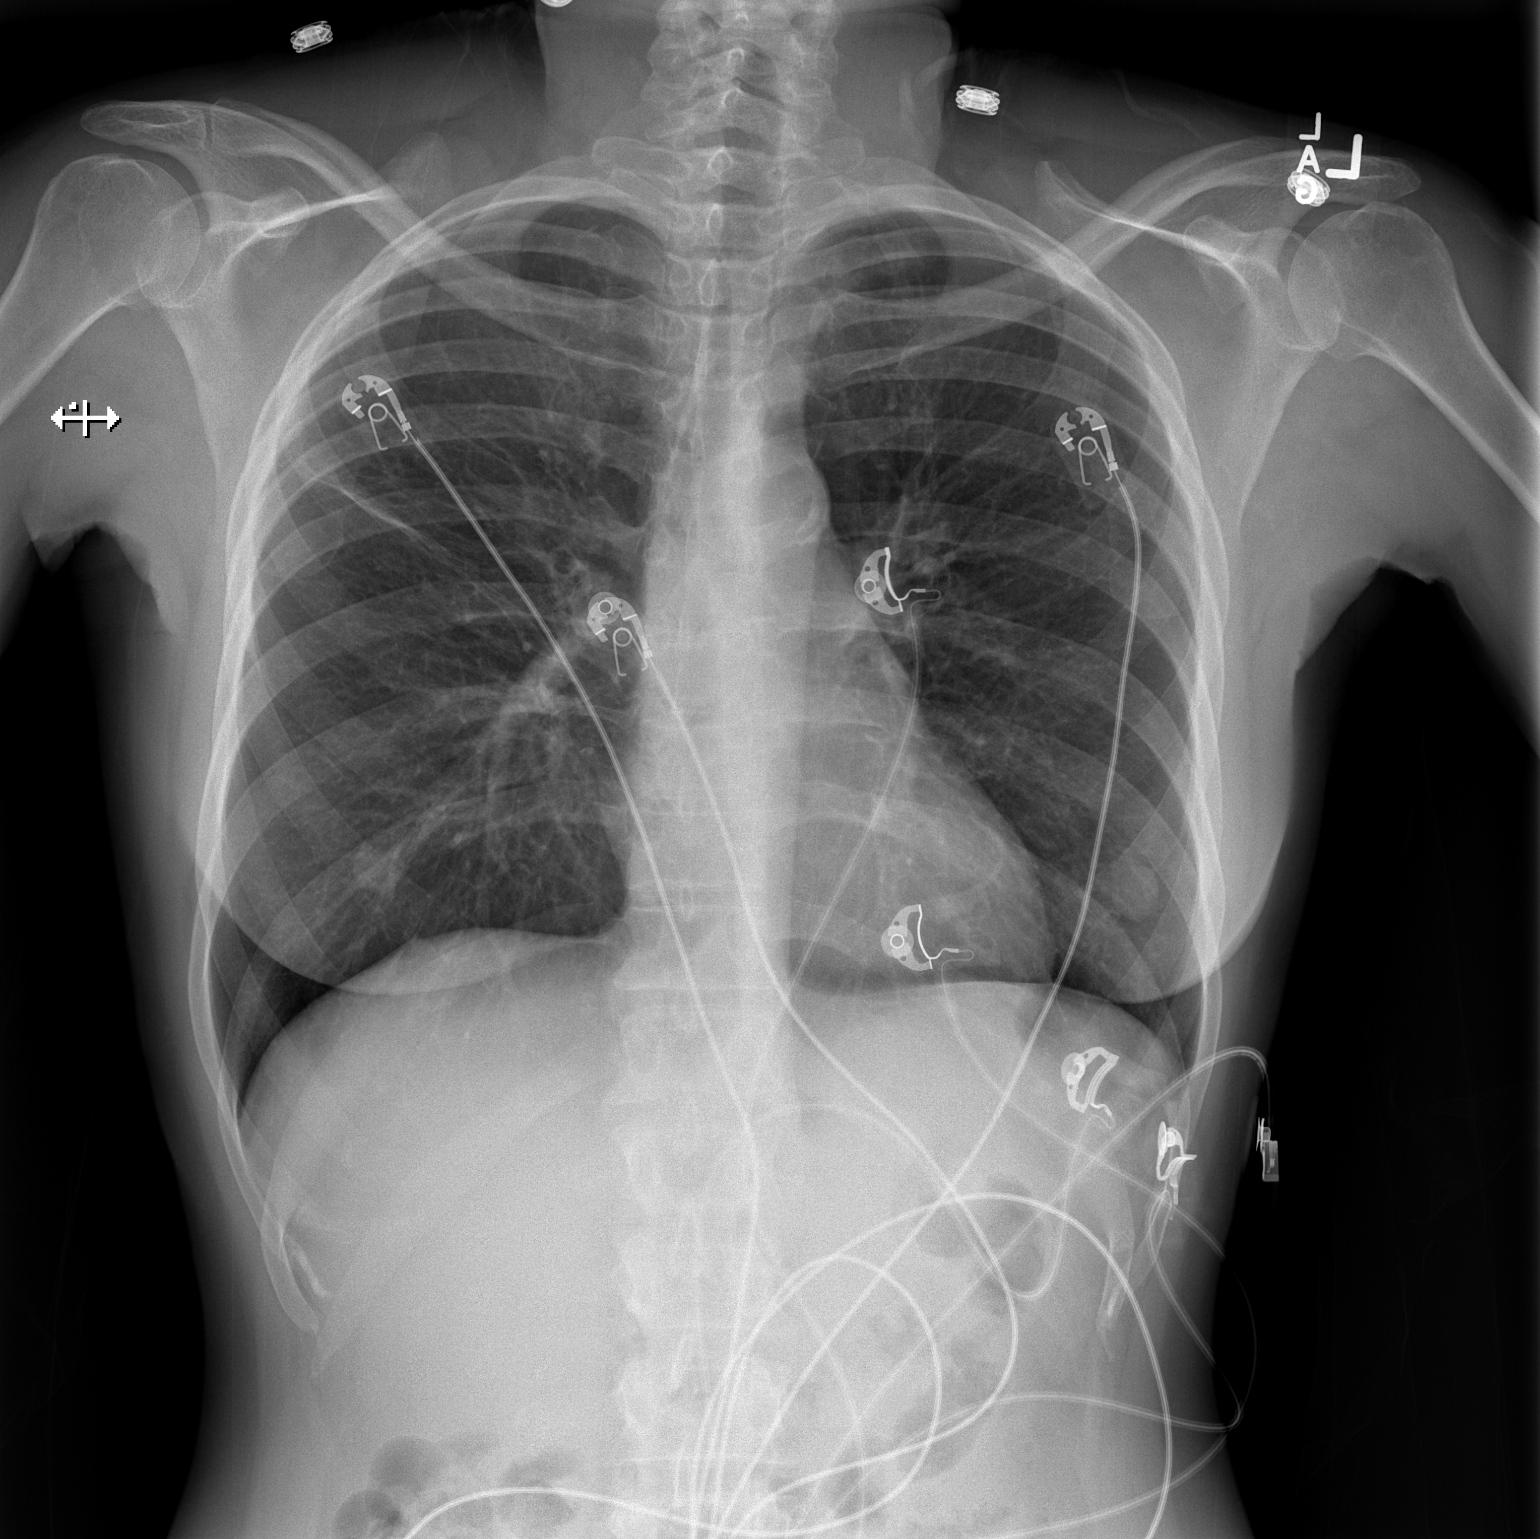

[w chest lat]
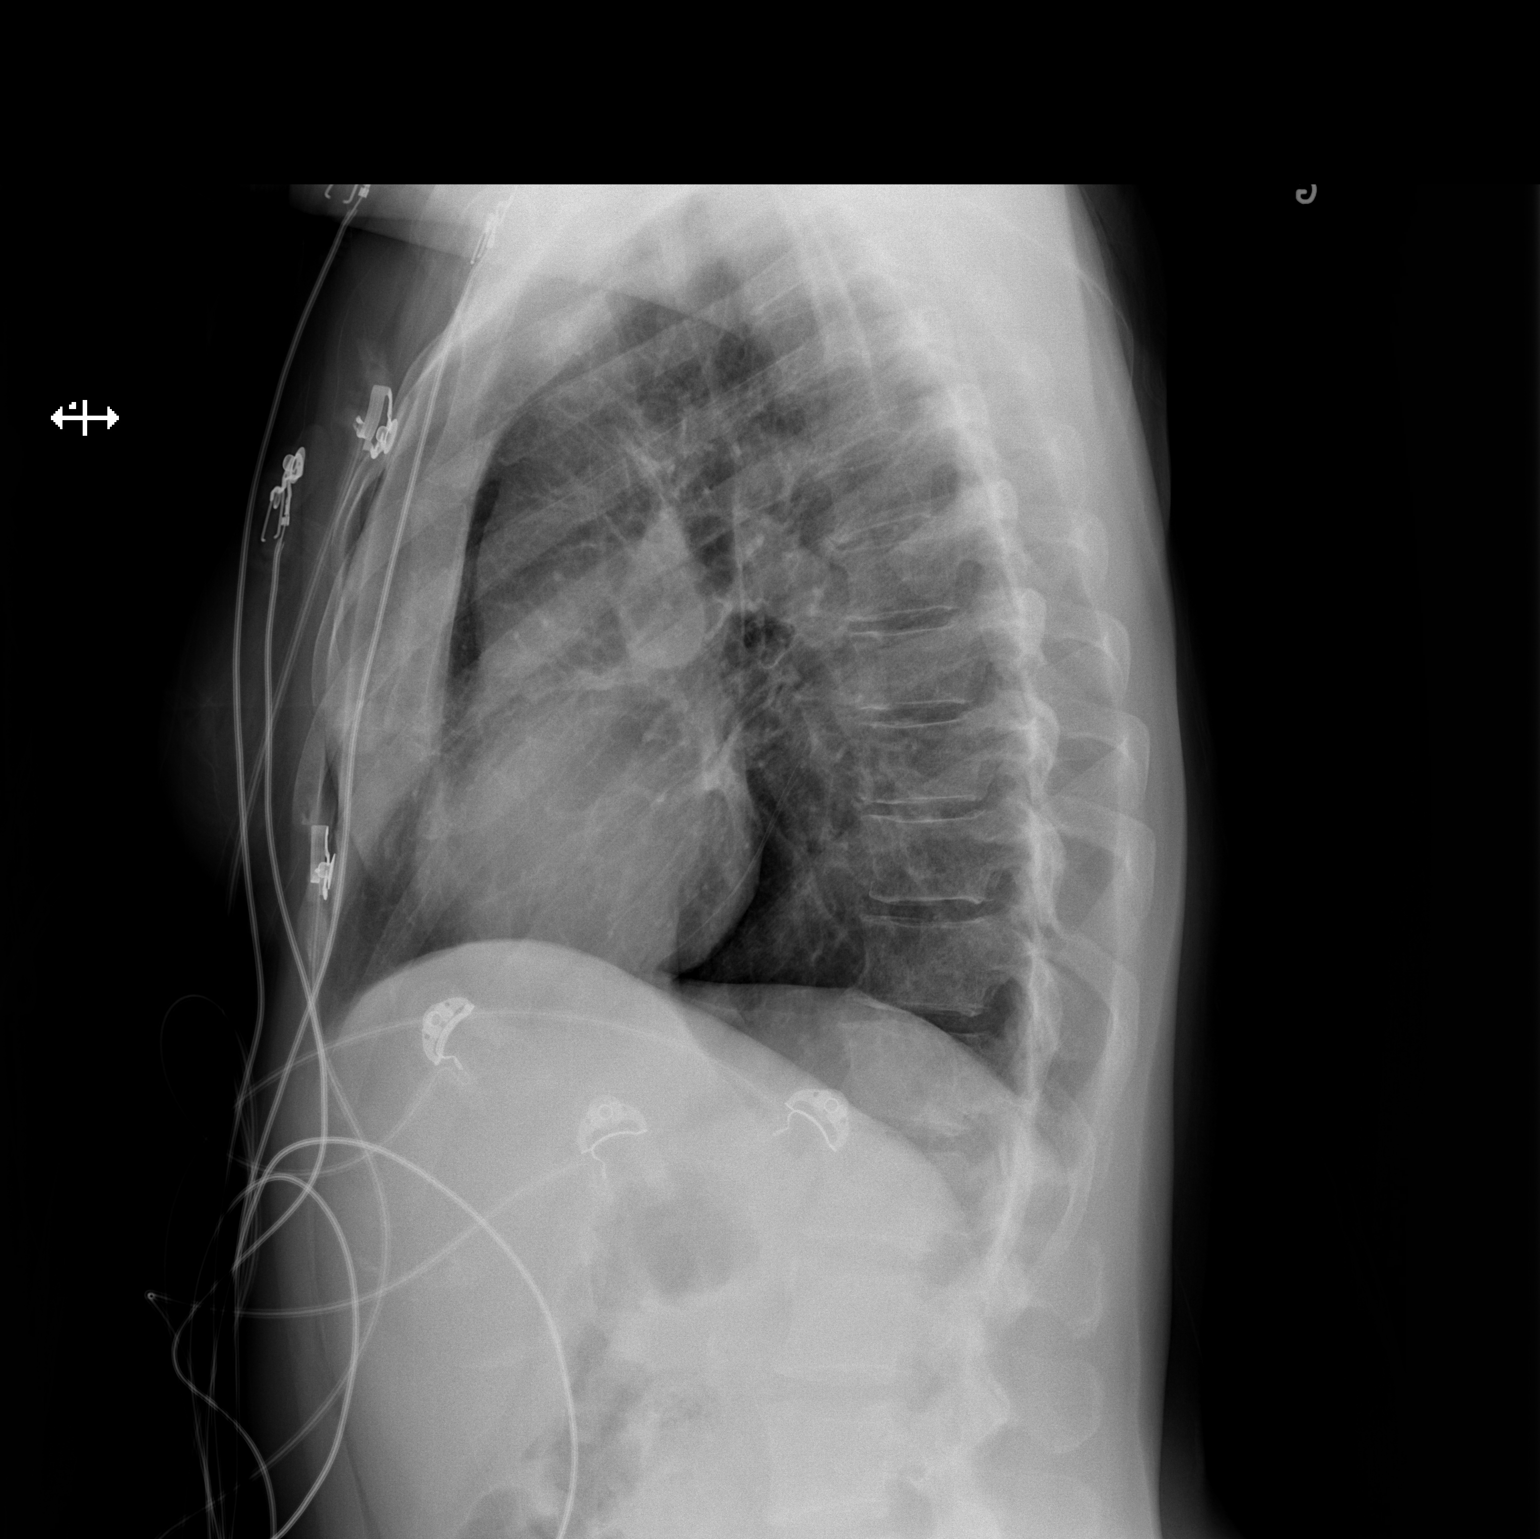

[2 of 2 positions shown; findings below may reference images not displayed]

FINDINGS: Right upper lobe scarring again noted and unchanged. Remaining lungs
are clear. Negative for heart failure or pneumonia. No pleural
effusion.
IMPRESSION: No active cardiopulmonary disease.

## 2016-04-21 ENCOUNTER — Ambulatory Visit: Payer: Self-pay

## 2016-04-27 ENCOUNTER — Emergency Department (HOSPITAL_COMMUNITY): Payer: Medicaid Other

## 2016-04-27 ENCOUNTER — Encounter (HOSPITAL_COMMUNITY): Payer: Self-pay | Admitting: Emergency Medicine

## 2016-04-27 ENCOUNTER — Inpatient Hospital Stay (HOSPITAL_COMMUNITY)
Admission: EM | Admit: 2016-04-27 | Discharge: 2016-05-01 | DRG: 682 | Disposition: A | Discharge status: Home / Self Care | Payer: Medicaid Other | Attending: Internal Medicine | Admitting: Internal Medicine

## 2016-04-27 DIAGNOSIS — L03115 Cellulitis of right lower limb: Secondary | ICD-10-CM | POA: Diagnosis present

## 2016-04-27 DIAGNOSIS — I10 Essential (primary) hypertension: Secondary | ICD-10-CM | POA: Diagnosis present

## 2016-04-27 DIAGNOSIS — N179 Acute kidney failure, unspecified: Secondary | ICD-10-CM | POA: Diagnosis present

## 2016-04-27 DIAGNOSIS — F14988 Cocaine use, unspecified with other cocaine-induced disorder: Secondary | ICD-10-CM | POA: Diagnosis present

## 2016-04-27 DIAGNOSIS — E43 Unspecified severe protein-calorie malnutrition: Secondary | ICD-10-CM | POA: Diagnosis present

## 2016-04-27 DIAGNOSIS — L03012 Cellulitis of left finger: Secondary | ICD-10-CM | POA: Diagnosis not present

## 2016-04-27 DIAGNOSIS — L0390 Cellulitis, unspecified: Secondary | ICD-10-CM | POA: Diagnosis present

## 2016-04-27 DIAGNOSIS — F1420 Cocaine dependence, uncomplicated: Secondary | ICD-10-CM | POA: Diagnosis present

## 2016-04-27 DIAGNOSIS — F1910 Other psychoactive substance abuse, uncomplicated: Secondary | ICD-10-CM | POA: Diagnosis not present

## 2016-04-27 DIAGNOSIS — F1220 Cannabis dependence, uncomplicated: Secondary | ICD-10-CM | POA: Diagnosis present

## 2016-04-27 DIAGNOSIS — E860 Dehydration: Secondary | ICD-10-CM | POA: Diagnosis present

## 2016-04-27 DIAGNOSIS — F14288 Cocaine dependence with other cocaine-induced disorder: Secondary | ICD-10-CM

## 2016-04-27 DIAGNOSIS — F17210 Nicotine dependence, cigarettes, uncomplicated: Secondary | ICD-10-CM | POA: Diagnosis present

## 2016-04-27 DIAGNOSIS — Z681 Body mass index (BMI) 19 or less, adult: Secondary | ICD-10-CM | POA: Diagnosis not present

## 2016-04-27 DIAGNOSIS — B9689 Other specified bacterial agents as the cause of diseases classified elsewhere: Secondary | ICD-10-CM | POA: Diagnosis not present

## 2016-04-27 DIAGNOSIS — M064 Inflammatory polyarthropathy: Secondary | ICD-10-CM | POA: Diagnosis present

## 2016-04-27 DIAGNOSIS — I776 Arteritis, unspecified: Secondary | ICD-10-CM | POA: Diagnosis present

## 2016-04-27 DIAGNOSIS — F339 Major depressive disorder, recurrent, unspecified: Secondary | ICD-10-CM | POA: Diagnosis present

## 2016-04-27 DIAGNOSIS — L98491 Non-pressure chronic ulcer of skin of other sites limited to breakdown of skin: Secondary | ICD-10-CM

## 2016-04-27 DIAGNOSIS — L03011 Cellulitis of right finger: Secondary | ICD-10-CM | POA: Diagnosis present

## 2016-04-27 DIAGNOSIS — I999 Unspecified disorder of circulatory system: Secondary | ICD-10-CM

## 2016-04-27 DIAGNOSIS — L958 Other vasculitis limited to the skin: Secondary | ICD-10-CM | POA: Diagnosis not present

## 2016-04-27 LAB — CBC WITH DIFFERENTIAL/PLATELET
BASOS ABS: 0 10*3/uL (ref 0.0–0.1)
Basophils Relative: 0 %
EOS PCT: 1 %
Eosinophils Absolute: 0.1 10*3/uL (ref 0.0–0.7)
HCT: 36.4 % (ref 36.0–46.0)
Hemoglobin: 12.2 g/dL (ref 12.0–15.0)
LYMPHS PCT: 14 %
Lymphs Abs: 1.6 10*3/uL (ref 0.7–4.0)
MCH: 28.3 pg (ref 26.0–34.0)
MCHC: 33.5 g/dL (ref 30.0–36.0)
MCV: 84.5 fL (ref 78.0–100.0)
Monocytes Absolute: 0.6 10*3/uL (ref 0.1–1.0)
Monocytes Relative: 6 %
Neutro Abs: 9.1 10*3/uL — ABNORMAL HIGH (ref 1.7–7.7)
Neutrophils Relative %: 79 %
PLATELETS: 261 10*3/uL (ref 150–400)
RBC: 4.31 MIL/uL (ref 3.87–5.11)
RDW: 16.8 % — ABNORMAL HIGH (ref 11.5–15.5)
WBC: 11.4 10*3/uL — ABNORMAL HIGH (ref 4.0–10.5)

## 2016-04-27 LAB — SEDIMENTATION RATE: SED RATE: 70 mm/h — AB (ref 0–22)

## 2016-04-27 LAB — BASIC METABOLIC PANEL WITH GFR
Anion gap: 14 (ref 5–15)
BUN: 56 mg/dL — ABNORMAL HIGH (ref 6–20)
CO2: 19 mmol/L — ABNORMAL LOW (ref 22–32)
Calcium: 8.4 mg/dL — ABNORMAL LOW (ref 8.9–10.3)
Chloride: 101 mmol/L (ref 101–111)
Creatinine, Ser: 5.84 mg/dL — ABNORMAL HIGH (ref 0.44–1.00)
GFR calc Af Amer: 9 mL/min — ABNORMAL LOW
GFR calc non Af Amer: 7 mL/min — ABNORMAL LOW
Glucose, Bld: 115 mg/dL — ABNORMAL HIGH (ref 65–99)
Potassium: 4 mmol/L (ref 3.5–5.1)
Sodium: 134 mmol/L — ABNORMAL LOW (ref 135–145)

## 2016-04-27 LAB — CREATININE, URINE, RANDOM: Creatinine, Urine: 130.08 mg/dL

## 2016-04-27 LAB — C-REACTIVE PROTEIN: CRP: 14.4 mg/dL — ABNORMAL HIGH

## 2016-04-27 LAB — SODIUM, URINE, RANDOM: SODIUM UR: 63 mmol/L

## 2016-04-27 MED ORDER — BACITRACIN ZINC 500 UNIT/GM EX OINT
TOPICAL_OINTMENT | Freq: Two times a day (BID) | CUTANEOUS | Status: DC
  Administered 2016-04-27: 20:00:00 via TOPICAL
  Administered 2016-04-27: 1 via TOPICAL
  Administered 2016-04-27 – 2016-04-30 (×7): via TOPICAL
  Filled 2016-04-27 (×2): qty 28.35
  Filled 2016-04-27: qty 0.9
  Filled 2016-04-27: qty 28.35

## 2016-04-27 MED ORDER — PROSIGHT PO TABS
1.0000 | ORAL_TABLET | Freq: Every day | ORAL | Status: DC
  Administered 2016-04-27 – 2016-05-01 (×5): 1 via ORAL
  Filled 2016-04-27 (×6): qty 1

## 2016-04-27 MED ORDER — ONDANSETRON HCL 4 MG PO TABS
4.0000 mg | ORAL_TABLET | Freq: Four times a day (QID) | ORAL | Status: DC | PRN
  Administered 2016-04-27: 4 mg via ORAL
  Filled 2016-04-27: qty 1

## 2016-04-27 MED ORDER — ONDANSETRON HCL 4 MG/2ML IJ SOLN: 4.0000 mg | Freq: Four times a day (QID) | INTRAMUSCULAR | Status: DC | PRN

## 2016-04-27 MED ORDER — VANCOMYCIN HCL IN DEXTROSE 1-5 GM/200ML-% IV SOLN
1000.0000 mg | Freq: Once | INTRAVENOUS | Status: AC
  Administered 2016-04-27: 1000 mg via INTRAVENOUS
  Filled 2016-04-27: qty 200

## 2016-04-27 MED ORDER — MUPIROCIN CALCIUM 2 % EX CREA
TOPICAL_CREAM | Freq: Every day | CUTANEOUS | Status: DC
  Administered 2016-04-27 – 2016-05-01 (×6): via TOPICAL
  Filled 2016-04-27: qty 15

## 2016-04-27 MED ORDER — METHYLPREDNISOLONE SODIUM SUCC 125 MG IJ SOLR
80.0000 mg | Freq: Every day | INTRAMUSCULAR | Status: DC
  Administered 2016-04-27 – 2016-04-29 (×3): 80 mg via INTRAVENOUS
  Filled 2016-04-27 (×3): qty 2

## 2016-04-27 MED ORDER — SODIUM CHLORIDE 0.9 % IV BOLUS (SEPSIS)
1000.0000 mL | Freq: Once | INTRAVENOUS | Status: AC
  Administered 2016-04-27: 1000 mL via INTRAVENOUS

## 2016-04-27 MED ORDER — AMLODIPINE BESYLATE 5 MG PO TABS
5.0000 mg | ORAL_TABLET | Freq: Every day | ORAL | Status: DC
  Administered 2016-04-27 – 2016-05-01 (×5): 5 mg via ORAL
  Filled 2016-04-27 (×5): qty 1

## 2016-04-27 MED ORDER — OXYCODONE HCL 5 MG PO TABS
5.0000 mg | ORAL_TABLET | ORAL | Status: DC | PRN
  Administered 2016-04-27 (×3): 5 mg via ORAL
  Filled 2016-04-27 (×3): qty 1

## 2016-04-27 MED ORDER — HEPARIN SODIUM (PORCINE) 5000 UNIT/ML IJ SOLN
5000.0000 [IU] | Freq: Three times a day (TID) | INTRAMUSCULAR | Status: DC
  Administered 2016-04-27 – 2016-05-01 (×12): 5000 [IU] via SUBCUTANEOUS
  Filled 2016-04-27 (×12): qty 1

## 2016-04-27 MED ORDER — SODIUM CHLORIDE 0.9 % IV SOLN
INTRAVENOUS | Status: AC
  Administered 2016-04-27: 11:00:00 via INTRAVENOUS

## 2016-04-27 MED ORDER — DEXTROSE 5 % IV SOLN
1.0000 g | INTRAVENOUS | Status: DC
  Administered 2016-04-27 – 2016-04-30 (×4): 1 g via INTRAVENOUS
  Filled 2016-04-27 (×5): qty 10

## 2016-04-27 MED ORDER — MORPHINE SULFATE (PF) 4 MG/ML IV SOLN
6.0000 mg | Freq: Once | INTRAVENOUS | Status: AC
  Administered 2016-04-27: 6 mg via INTRAVENOUS
  Filled 2016-04-27: qty 2

## 2016-04-27 MED ORDER — OXYCODONE HCL 5 MG PO TABS
5.0000 mg | ORAL_TABLET | ORAL | Status: DC | PRN
  Administered 2016-04-27: 5 mg via ORAL
  Administered 2016-04-28 – 2016-05-01 (×13): 10 mg via ORAL
  Filled 2016-04-27 (×13): qty 2
  Filled 2016-04-27: qty 1

## 2016-04-27 NOTE — Progress Notes (Signed)
Pharmacy Antibiotic Note  [name] is a 55 y.o. female admitted on 04/27/2016 with cellulitis.  Pharmacy has been consulted for ceftriaxone and vancomycin dosing. 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 She was treated with antibiotics in the hospital, initially Ceftriaxone then Cefazolin and discharged on oral Keflex, which she states she may have missed a day as she has a few pills left. Keflex was anticipated to end on 7/5. Re-admitted 7/24 - she thinks her chronic RLE wound is er-infected b/c it is painful and has malodorous drainage. 2 new painful lesions on fingers.  ARF with creat 5.84 on admission, baseline creat usus < 1.  Superimposed cellulitis of chronic RLE wound.   Plan:   Ceftriaxone 1 gm q24 vancomycin 1 gm loading dose to day, f/u creat for maintenance dose F/u renal fxn, wbc, temp, clinical course Vanc levels as needed  Temp (24hrs), Avg:98.4 F (36.9 C), Min:98.3 F (36.8 C), Max:98.5 F (36.9 C)   Recent Labs Lab 04/27/16 0615  WBC 11.4*  CREATININE 5.84*    Estimated Creatinine Clearance: 7.7 mL/min (by C-G formula based on SCr of 5.84 mg/dL).    Allergies  Allergen Reactions  . Acetaminophen Swelling    REACTION: eyelid swelling   Thank you for allowing pharmacy to be a part of this patient's care.  Eudelia Bunch, Pharm.D. [phone] 04/27/2016 9:36 AM

## 2016-04-27 NOTE — ED Notes (Signed)
Admitting MD at bedside.

## 2016-04-27 NOTE — ED Triage Notes (Addendum)
Pt. reports worsening chronic infected wounds at bilateral fingers/ right lower leg with malodorous drainage , denies fever or chills , Cocaine use yesterday.

## 2016-04-27 NOTE — H&P (Signed)
Date: 04/27/2016               Patient Name:  SULLY MANZI MRN: [id number]  DOB: 25-Mar-1961 Age / Sex: 55 y.o., female   PCP: Zada Finders, MD         Medical Service: Internal Medicine Teaching Service         Attending Physician: Dr. Aldine Contes, MD    First Contact: Dr. Wynetta Emery Pager: [phone]  Second Contact: Dr. Randell Patient Pager: [phone]       After Hours (After 5p/  First Contact Pager: [phone]  weekends / holidays): Second Contact Pager: [phone]   Chief Complaint: painful wounds  History of Present Illness: Ms. 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   Patient was recently admitted from 6/28-7/1 with a chronic cutaneous flare of with superinfection of LLE wound. She was intially treated with ceftriaxone, transitioned to cefazolin and then discharged on Keflex. She also received prednisone 40 mg daily for her wounds. She followed up in the clinic on 7/12 and reported that her wounds appeared to be healing. At that time, she was set up to follow up with the wound care clinic and also started on lisinopril for HTN and Naproxen for pain.   Patient presents today stating she thinks her chronic RLE wound is re-infected as it is painful and has a malodorous drainage again. She also has two new painful lesions on her left 3rd digit and right second digit that newly appeared in the last 2 days. The left third digit is swollen and erythematous. She admits to malaise, fatigue, nausea and three episodes of non-bloody, non-bilious emesis this morning. She denies associated fevers, chills, lightheadedness, chest pain, shortness of breath, abdominal pain or diarrhea. She thinks she feels dehydrated due to her thirst.   In the ED, patient was afebrile, normotensive, HR 92, RR 18, pulse ox 99% on room air. CBC showed mild leukocytosis of 11.4 with left  shift. BMET revealed creatinine of 5.84 (baseline is <1). Left hand xray showed diffuse soft tissue swelling of 3rd digit and right hand xray showed diffuse soft issue swelling of index finger.   Meds: Current Facility-Administered Medications  Medication Dose Route Frequency Provider Last Rate Last Dose  . bacitracin ointment   Topical BID Varney Biles, MD   1 application at 88/50/27 0617  . oxyCODONE (Oxy IR/ROXICODONE) immediate release tablet 5 mg  5 mg Oral Q4H PRN Florinda Marker, MD   5 mg at 04/27/16 0835  . sodium chloride 0.9 % bolus 1,000 mL  1,000 mL Intravenous Once Varney Biles, MD 1,000 mL/hr at 04/27/16 0835 1,000 mL at 04/27/16 0835    Allergies: Allergies as of 04/27/2016 - Review Complete 04/27/2016  Allergen Reaction Noted  . Acetaminophen Swelling    Past Medical History:  Diagnosis Date  . CAP (community acquired pneumonia) 03/2014 X 2  . Cocaine abuse    ongoing with resultant vaculitis.  . Daily headache   . Depression   . Hypertension   . Inflammatory arthritis ([place])   . Migraines    "probably 5-6/yr" (04/21/2014)  . Normocytic anemia    BL Hgb 9.8-12. Last anemia panel 04/2010 - showing Fe 19, ferritin 101.  Pt on monthly B12 injections  . Rheumatoid arthritis(714.0)    patient reported  . VASCULITIS 04/17/2010   2/2  levimasole toxicity vs autoimmune d/o     . Vasculitis ([place])    2/2 Levimasole toxicity. Followed by Dr. Louanne Skye    Family History:  Mother: breast cancer, alcohol abuse Father: alcohol abuse  Social History:  Tobacco Use: 2-3 cigarettes/day Alcohol Use: Rarely Illicit Drug Use: Cocaine- last use yesterday. Marijuana "every now and then"  Review of Systems: A complete ROS was negative except as per HPI.   Physical Exam: Vitals:   04/27/16 0530 04/27/16 0545 04/27/16 0600 04/27/16 0731  BP:  114/61 139/88 126/85  Pulse:  103 103 99  Resp:    16  Temp:      TempSrc:      SpO2: 100% 100% 100% 98%   General: Vital signs  reviewed.  Patient is thin, chronically ill appearing, in no acute distress and cooperative with exam.  Ears and Nose: Deformities from levamisole vasculitis Eyes: PERRLA, conjunctival injection bilaterally.  Neck: Supple, trachea midline.  Cardiovascular: Tachycardic, regular rhythm, S1 normal, S2 normal, no murmurs, gallops, or rubs. Pulmonary/Chest: Clear to auscultation bilaterally, no wheezes, rales, or rhonchi. Abdominal: Soft, non-tender, non-distended, BS +.  Extremities: Normal radial and pedal pulses, movement and sensation in tact.  Left Hand 3rd digit- erythematous, edematous, dorsal lesion over DIP with ulceration and drainage. Right Hand 2nd digit- erythematous with sanguinous drainage. RLE: Diffuse ulceration extending from posterior ankle proximally to knee. Distal lesion appears infected with erythema, purulent strong malodorous drainage diffusely warm with tenderness LLE: 3 cm x 3 cm circumscribed ulceration over medial ankle with minimal drainage and without surrounding erythema. Neurological: Awake, alert, oriented. Answers questions appropriately. Psychiatric: Tearful on exam, labile mood. Memory is grossly normal.   Left Hand Xray: Diffuse soft tissue swelling of the third digit may be infectious or inflammatory in etiology. Clinical correlation is recommended. No acute fracture or dislocation. Chronic erosive changes of the PIP joint of the fourth digit.  Right Hand Xray: Diffuse soft tissue swelling of the index finger likely related to underlying inflammatory arthritis. Underlying infection is not excluded. Clinical correlation is recommended.  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   Acute Renal Failure: Likely secondary to decreased po intake, vomiting, and recent start of lisinopril with concomitant use of Naproxen for pain. Creatinine 5.84 on admission, creatinine baseline is normally less than 1.  -FENa -Discontinue Naproxen and Lisinopril -NS 1 L boluse -NS 125 mL/hr x 12 hours -Repeat BMET tomorrow am -Encourage po intake -Zofran for nausea -Strict I/Os -Caution while on Vancomycin  Superimposed Cellulitis of Chronic RLE Wound: RLE wound has diffuse ulceration extending from posterior ankle proximally to knee. Distal lesion appears infected with erythema, purulent strong malodorous drainage, diffusely warm and tender. Afebrile, mildly tachycardic, mild leukocytosis. -Repeat CBC/BMET tomorrow am -Blood Cultures -Solumedrol 80 mg IV for vasculitis flare -Empiric antibiotics, will start Vancomycin and Ceftriaxone, unlikely to be pseudomonas at this time - could consider cefepime if not improving  Flare of Levamisole-Induced Cutaneous Vasculitis: Likely secondary to ongoing cocaine use. New lesions appearing on left 3rd digit and right second digit which  are new in the last 2 days. Left Hand 3rd digit is erythematous, edematous, dorsal lesion over DIP with ulceration and drainage. Right Hand 2nd digit is erythematous with sanguinous drainage. -Wound care consult -Solumedrol IV given patient has been on prednisone 40 mg daily, with long taper -Antibiotics as above -Consider hand surgery consult if no improvement in 24 hours  .  HTN: Normotensive on exam. Patient was started on lisinopril as outpatient. Given ARF, we will discontinue lisinopril and start amlodipine 5 mg daily.  -Amlodipine 5 mg daily  Chronic Substance Abuse: Patient admits to ongoing cocaine, marijuana and tobacco abuse. -Counseled on  cessation  DVT/PE ppx: Heparin TID  FEN: NS, Regular diet CODE: FULL  Dispo: Admit patient to Inpatient with expected length of stay greater than 2 midnights.               Signed: Martyn Malay, DO PGY-3 Internal Medicine Resident Pager # [phone] 04/27/2016 9:11 AM

## 2016-04-27 NOTE — ED Notes (Signed)
Wound to back of right leg covered with non-adherent bandage at this time per admitting MD request.

## 2016-04-27 NOTE — ED Notes (Signed)
Pt. Eating a meal while sitting calmly in her room.

## 2016-04-27 NOTE — Consult Note (Addendum)
WOC wound consult note This patient is well known to our department from her multiple previous admissions, most recent was in June. She presents with levamisole vasculitis with a history of multiple wounds to ears and legs. She admits she is aware that these wounds are the result of a component in the drugs. Reason for Consult:LE ulcers Wound type:Levamisole vasculitis Bilat ears with dry stable eschar greatly improved since previous admission, few patchy areas remain with small amt yellow drainage. Measurement:Full thickness wound to left leg 3.5X3.5X.2cm, dark red and moist, mod amt tan drainage, no odor. Slightly improved since previous assessment. Right posterior leg with multiple full thickness wounds; entire affected area approx 17X6X.2cm; 80% dark red, 20% slough, mod amt tan drainage, strong odor. Area has declined since previous admission. Left middle finger with large amt joint swelling and new full thickness wound; .8X.8X.1cm, dark red with small amt tan drainage, some odor. Right 1st finger with large amt joint sweling, some tan drainage weeping. Dressing procedure/placement/frequency: Patient has low tolerance for any wound contact layer suggested related to pain: [facility] team has tried multiple products in the past including non-adherent gauze, silicone, petrolatum and oil emulsion. Today she requested to remove the old dressing herself rather than have someone else perform that task. She is sensitive to saline cleanse and will tolerate only non-adherent xeroform gauze; pt prefers to perform her own dressing changes and has been doing this prior to admission. Bactroban to ear eschar daily.  Recommend hand surgeon for left and right finger assessment and plan of care Discussed plan of care with patient and she verbalized understanding. Please re-consult if further assistance is needed.  Thank-you,  Julien Girt MSN, [place], [place], [place], [place]

## 2016-04-27 NOTE — ED Provider Notes (Addendum)
[facility] DEPT Provider Note   CSN: [id number] Arrival date & time: 04/27/16  [id number]  First Provider Contact:  First MD Initiated Contact with Patient 04/27/16 [phone]        History   Chief Complaint Chief Complaint  Patient presents with  . Wound Infection    HPI [name] is a 55 y.o. female.  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 She reports that 2 days ago she noticed swelling and ulceration of the L long digit. Today she had a swelling of the R index finger. Pt has pain. She has some drainage on the L side, yellow. Pt denies fevers, chills. She had recent admission for cellulitis in the leg. She denies active cocaine use.   ROS 10 Systems reviewed and are negative for acute change except as noted in the HPI.     Past Medical History:  Diagnosis Date  . CAP (community acquired pneumonia) 03/2014 X 2  . Cocaine abuse    ongoing with resultant vaculitis.  . Daily headache   . Depression   . Hypertension   . Inflammatory arthritis ([place])   . Migraines    "probably 5-6/yr" (04/21/2014)  . Normocytic anemia    BL Hgb 9.8-12. Last anemia panel 04/2010 - showing Fe 19, ferritin 101.  Pt on monthly B12 injections  . Rheumatoid arthritis(714.0)    patient reported  . 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 Vasculitis ([place])    2/2 Levimasole toxicity. Followed by Dr. Louanne Skye    Patient Active Problem List   Diagnosis Date Noted  . Cellulitis 04/27/2016  . Acute renal failure (ARF) ([place]) 04/27/2016  . Healthcare maintenance 04/15/2016  . Leg ulcer ([place])   . Leg wound, left   . Skin ulcer ([place]) 06/04/2015  . Pressure ulcer 04/22/2015  . Skin necrosis 04/21/2015  . Protein-calorie malnutrition, severe ([place]) 01/15/2015  . Cocaine abuse   . Swelling of finger, right 01/14/2015  . Foot ulceration ([place]) 01/14/2015  . Severe recurrent major depression without  psychotic features ([place]) 10/16/2014  . Cocaine use disorder, severe, dependence ([place]) 10/10/2014  . Cannabis use disorder, severe, dependence ([place]) 10/10/2014  . 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 Substance induced mood disorder ([place]) 10/09/2014  . Major depressive disorder, single episode, severe without psychotic features ([place])   . 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 Vasculitis ([place]) 04/19/2014  . Hepatomegaly 04/11/2014  . Acute pericarditis 03/31/2014  . Major depression, recurrent ([place]) 01/13/2014  . Inflammatory arthritis ([place]) 09/24/2013  . 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 Cocaine dependence ([place]) 05/15/2013  . Tobacco abuse 01/03/2013  . hyperlipidemia 01/03/2013  . Essential hypertension 02/26/2010    Past Surgical History:  Procedure Laterality Date  . HERNIA REPAIR     "stomach"  . I&D EXTREMITY Right 09/26/2015   Procedure: IRRIGATION AND DEBRIDEMENT LEG WOUND  VAC PLACEMENT.;  Surgeon: Loel Lofty Dillingham, DO;  Location: [facility];  Service: Plastics;  Laterality: Right;  . IRRIGATION AND DEBRIDEMENT ABSCESS Bilateral 09/26/2013   Procedure: DEBRIDEMENT ULCERS BILATERAL THIGHS;  Surgeon: Gwenyth Ober, MD;  Location: [facility];  Service: General;  Laterality: Bilateral;  . SKIN BIOPSY Bilateral 04/2010   shin nodules    OB History    No data available       Home Medications    Prior to Admission medications  Medication Sig Start Date End Date Taking? Authorizing Provider  Bismuth Tribromoph-Petrolatum (XEROFORM PETROLAT GAUZE 5"X9") MISC Change on left lower leg wound 1-2 times daily 04/04/16  Yes Milagros Loll, MD  lisinopril (PRINIVIL,ZESTRIL) 10 MG tablet Take 1 tablet (10 mg total) by mouth daily. 04/15/16  Yes Zada Finders, MD  naproxen sodium (ANAPROX) 220 MG tablet Take 1 tablet (220 mg total) by mouth 3 (three) times daily with meals. Patient taking differently: Take 220 mg by  mouth 2 (two) times daily as needed (pain).  04/15/16  Yes Zada Finders, MD    Family History Family History  Problem Relation Age of Onset  . Breast cancer Mother     Breast cancer  . Alcohol abuse Mother   . Colon cancer Maternal Aunt 32  . Alcohol abuse Father     Social History Social History  Substance Use Topics  . Smoking status: Current Every Day Smoker    Packs/day: 0.30    Years: 34.00    Types: Cigarettes  . Smokeless tobacco: Never Used     Comment: "Trying very hard". / 4-5 CIGARETTES A DAY  . Alcohol use No     Allergies   Acetaminophen   Review of Systems Review of Systems   Physical Exam Updated Vital Signs BP 126/85   Pulse 99   Temp 98.5 F (36.9 C) (Oral)   Resp 16   SpO2 98%   Physical Exam  Constitutional: She is oriented to person, place, and time. She appears well-developed and well-nourished.  HENT:  Head: Normocephalic and atraumatic.  Eyes: EOM are normal. Pupils are equal, round, and reactive to light.  Neck: Neck supple.  Cardiovascular: Normal rate, regular rhythm and normal heart sounds.   No murmur heard. Pulmonary/Chest: Effort normal. No respiratory distress.  Abdominal: Soft. She exhibits no distension. There is no tenderness. There is no rebound and no guarding.  Neurological: She is alert and oriented to person, place, and time.  Skin: Skin is warm and dry. Capillary refill takes less than 2 seconds. There is erythema.  L long finger and R index finger are erythematous and edematous. There is ulceration on the L long finger on the dorsal side. ROM is compromised. There is yellow drainage on the ulceration.  Nursing note and vitals reviewed.    ED Treatments / Results  Labs (all labs ordered are listed, but only abnormal results are displayed) Labs Reviewed  CBC WITH DIFFERENTIAL/PLATELET - Abnormal; Notable for the following:       Result Value   WBC 11.4 (*)    RDW 16.8 (*)    Neutro Abs 9.1 (*)    All other  components within normal limits  BASIC METABOLIC PANEL - Abnormal; Notable for the following:    Sodium 134 (*)    CO2 19 (*)    Glucose, Bld 115 (*)    BUN 56 (*)    Creatinine, Ser 5.84 (*)    Calcium 8.4 (*)    GFR calc non Af Amer 7 (*)    GFR calc Af Amer 9 (*)    All other components within normal limits  C-REACTIVE PROTEIN - Abnormal; Notable for the following:    CRP 14.4 (*)    All other components within normal limits  SEDIMENTATION RATE    EKG  EKG Interpretation None       Radiology Dg Hand Complete Left  Result Date: 04/27/2016 CLINICAL DATA:  55 year old female with swelling of the left middle  finger. EXAM: LEFT HAND - COMPLETE 3+ VIEW COMPARISON:  None. FINDINGS: There is no acute fracture or dislocation. There is chronic erosive changes of the PIP joint of the fifth digit with ulnar subluxation. There is diffuse soft tissue swelling of the middle finger which may be infectious in etiology or related to underlying inflammatory process such as psoriatic arthritis. Clinical correlation is recommended. No radiopaque foreign object or soft tissue gas. IMPRESSION: Diffuse soft tissue swelling of the third digit may be infectious or inflammatory in etiology. Clinical correlation is recommended. No acute fracture or dislocation. Chronic erosive changes of the PIP joint of the fourth digit. Electronically Signed   By: Anner Crete M.D.   On: 04/27/2016 06:45  Dg Hand Complete Right  Result Date: 04/27/2016 CLINICAL DATA:  55 year old female with right index finger swelling. EXAM: RIGHT HAND - COMPLETE 3+ VIEW COMPARISON:  Radiograph dated 06/14/2015 FINDINGS: There is diffuse soft tissue swelling of the index finger, progressed compared to the prior study. This may be infectious in etiology or related to underlying inflammatory process such as psoriatic arthritis. Clinical correlation is recommended. There is no acute fracture for dislocation. The bones are mildly  osteopenic. No significant arthritic changes. There is extension of the PIP with flexion of the DIP of the third digit. IMPRESSION: Diffuse soft tissue swelling of the index finger likely related to underlying inflammatory arthritis. Underlying infection is not excluded. Clinical correlation is recommended. Electronically Signed   By: Anner Crete M.D.   On: 04/27/2016 06:51   Procedures Procedures (including critical care time)  Medications Ordered in ED Medications  bacitracin ointment (1 application Topical Given 04/27/16 0617)  morphine 4 MG/ML injection 6 mg (6 mg Intravenous Given 04/27/16 0615)     Initial Impression / Assessment and Plan / ED Course  I have reviewed the triage vital signs and the nursing notes.  Pertinent labs & imaging results that were available during my care of the patient were reviewed by me and considered in my medical decision making (see chart for details).  Clinical Course    Pt with likely flair up of vasculitis with superimposed infection of the digits. Plan is to start antibitoics, ensure no necrotizing infection and admit. PT can get hand / id consult if needed. We will defer the choice of antibiotics for the admitting doctors.  PT also noted to be in acute renal failure. PT denies active drug use, emesis, diarrhea, heavy nsaid use.   Final Clinical Impressions(s) / ED Diagnoses   Final diagnoses:  Cellulitis of finger of right hand  Cellulitis of finger of left hand  Skin ulceration, limited to breakdown of skin ([place])  AKI (acute kidney injury) [facility])    New Prescriptions New Prescriptions   No medications on file     Varney Biles, MD 04/27/16 [place], MD 04/27/16 [place], MD 04/27/16 [phone]

## 2016-04-27 NOTE — ED Notes (Signed)
Pt. continually calling out for pain medication and crying. No medications ordered at this time. Will page admitting MD.

## 2016-04-28 DIAGNOSIS — F1910 Other psychoactive substance abuse, uncomplicated: Secondary | ICD-10-CM

## 2016-04-28 DIAGNOSIS — N179 Acute kidney failure, unspecified: Principal | ICD-10-CM

## 2016-04-28 DIAGNOSIS — L03115 Cellulitis of right lower limb: Secondary | ICD-10-CM

## 2016-04-28 DIAGNOSIS — I10 Essential (primary) hypertension: Secondary | ICD-10-CM

## 2016-04-28 LAB — BASIC METABOLIC PANEL
Anion gap: 10 (ref 5–15)
BUN: 52 mg/dL — AB (ref 6–20)
CALCIUM: 8 mg/dL — AB (ref 8.9–10.3)
CO2: 17 mmol/L — AB (ref 22–32)
CREATININE: 3.65 mg/dL — AB (ref 0.44–1.00)
Chloride: 109 mmol/L (ref 101–111)
GFR calc Af Amer: 15 mL/min — ABNORMAL LOW (ref >60)
GFR calc non Af Amer: 13 mL/min — ABNORMAL LOW (ref >60)
GLUCOSE: 122 mg/dL — AB (ref 65–99)
Potassium: 4.3 mmol/L (ref 3.5–5.1)
Sodium: 136 mmol/L (ref 135–145)

## 2016-04-28 LAB — CBC
HCT: 32.5 % — ABNORMAL LOW (ref 36.0–46.0)
Hemoglobin: 10.6 g/dL — ABNORMAL LOW (ref 12.0–15.0)
MCH: 27.7 pg (ref 26.0–34.0)
MCHC: 32.6 g/dL (ref 30.0–36.0)
MCV: 84.9 fL (ref 78.0–100.0)
PLATELETS: 234 10*3/uL (ref 150–400)
RBC: 3.83 MIL/uL — ABNORMAL LOW (ref 3.87–5.11)
RDW: 16.9 % — ABNORMAL HIGH (ref 11.5–15.5)
WBC: 13 10*3/uL — ABNORMAL HIGH (ref 4.0–10.5)

## 2016-04-28 MED ORDER — MORPHINE SULFATE (PF) 2 MG/ML IV SOLN
2.0000 mg | Freq: Two times a day (BID) | INTRAVENOUS | Status: DC
  Administered 2016-04-28 – 2016-05-01 (×6): 2 mg via INTRAVENOUS
  Filled 2016-04-28 (×6): qty 1

## 2016-04-28 MED ORDER — SODIUM CHLORIDE 0.9 % IV SOLN
INTRAVENOUS | Status: AC
  Administered 2016-04-28: 14:00:00 via INTRAVENOUS

## 2016-04-28 NOTE — Progress Notes (Signed)
Subjective: [name] is in significant pain today, she says it feels about the same or worse compared to yesterday. Her wounds are still grossly purulent and malodorous with mildly worse swelling today. Her fingertips have developed a dusky hue concerning for poor perfusion. Wound care evaluated her yesterday afternoon (~1300) and applied bandages but these were dry and unchanged by this morning with no clear wound care recommendations aside from hand surgery consult for assessing her fingers. It seems that [name] best tolerates moist petroleum-based bandages such as xeroform and that she prefers to change her own bandages (although she does so without gloves or any sterile technique). She has terrible pain with dressing changes, especially if her dressings have been allowed to dry and adhere to her wounds. We will continue to IV solumedrol 80 mg, vancomycin, and ceftriaxone. Dressing changes BID with IV morphine seems prudent for patient comfort.  Contacted hand surgery - they will not see or evaluate the patient (who is well-known to them), as they have nothing to offer since the patient continues to use cocaine resulting in these injuries.  Objective: Vital signs in last 24 hours: Vitals:   04/27/16 1504 04/27/16 2056 04/28/16 0634 04/28/16 1422  BP: (!) 153/78 139/78 128/84 (!) 146/71  Pulse: 96 (!) 116 80 97  Resp: 17  16 17   Temp: 98.8 F (37.1 C) 98.6 F (37 C) 98.4 F (36.9 C) 98.2 F (36.8 C)  TempSrc: Oral Oral Oral Oral  SpO2: 100% 99% 100% 99%    Intake/Output Summary (Last 24 hours) at 04/28/16 1432 Last data filed at 04/28/16 1422  Gross per 24 hour  Intake              680 ml  Output             1900 ml  Net            -1220 ml   Physical Exam General appearance: Thin, ill-appearing woman lying in bed in moderate distress, tearful and moaning in pain HENT: 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 Cardiovascular: Mildly tachycardic but regular rate and rhythm, no  murmurs, rubs, gallops Respiratory/Chest: Clear to ausculation bilaterally, normal work of breathing Abdomen: Bowel sounds present, soft, non-tender, non-distended Extremities: 2+ radial and pedal pulses, sensation intact,   1. Left hand 3rd digit, open ulceration on extensor surface over PIP joint, with edematous, erythematous, weaping  2. Right hand 2nd digit, grossly edematous and erythematous PIP joint without ulceration  3. Right leg, Diffuse ulceration extending from posterior ankle proximally to knee. Distal lesion appears infected with erythema, purulent strong malodorous drainage diffusely warm with tenderness, right medial base of big toe 3x3cm purulent ulceration  4. Left leg, 3x3cm ulceration over medial ankle with minimal erythema/drainage Neuro: Cranial nerves grossly intact, alert and oriented Psych: Tearful and labile affect  Labs / Imaging / Procedures: CBC Latest Ref Rng & Units 04/28/2016 04/27/2016 04/04/2016  WBC 4.0 - 10.5 K/uL 13.0(H) 11.4(H) 11.9(H)  Hemoglobin 12.0 - 15.0 g/dL 10.6(L) 12.2 9.9(L)  Hematocrit 36.0 - 46.0 % 32.5(L) 36.4 30.5(L)  Platelets 150 - 400 K/uL 234 261 365   BMP Latest Ref Rng & Units 04/28/2016 04/27/2016 04/02/2016  Glucose 65 - 99 mg/dL 122(H) 115(H) 94  BUN 6 - 20 mg/dL 52(H) 56(H) 14  Creatinine 0.44 - 1.00 mg/dL 3.65(H) 5.84(H) 0.69  BUN/Creat Ratio 9 - 23 - - -  Sodium 135 - 145 mmol/L 136 134(L) 139  Potassium 3.5 - 5.1 mmol/L  4.3 4.0 3.7  Chloride 101 - 111 mmol/L 109 101 108  CO2 22 - 32 mmol/L 17(L) 19(L) 23  Calcium 8.9 - 10.3 mg/dL 8.0(L) 8.4(L) 8.8(L)   Blood cultures pending  Assessment/Plan:  [name] is a 55 y.o. 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  Cellulitis of chronic RLE wounds, diffuse purulent ulceration of multiple extremities, recent cocaine use 2 days ago, ongoing levamisole-induced  vasculitis flare, concern for dusky fingertips  - Vancomycin and Ceftriaxone per pharmacy, broaden CTX to Cefepime if wounds continue to worsen  - Blood cultures pending  - Continue Solumedrol 80 mg IV QD for vasculitis flare  - Moist dressing changes BID with morphine 60m IV pre-medication  - Wound care evaluated and signed off  - Attempted hand surgery consult, will revisit if digits continue to worsen  Acute Renal Failure, likely pre-renal due to poor po intake, N/V, NSAID use for pain, Cr improving to 3.65 from 5.84 on admission, baseline less than 1  - IVF as needed if poor po intake  - avoid NSAIDs  - daily BMPs  - Zofran for nausea  - Strict I/Os  - Vancomycin dosing per pharmacy  HTN  - Amlodipine 564mdaily  Chronic substance abuse  - Discusses abstinence/cessation  FEN/GI: NS IVF, regular diet DVT ppx: heparin TID  Dispo: Anticipated discharge in approximately 2-3 day(s).   LOS: 1 day   AdAsencion PartridgeMD 04/28/2016, 2:32 PM Pager: [phone]

## 2016-04-28 NOTE — Consult Note (Signed)
Reason for Consult:crack cocaine levimisole vasculitis with finger skin necrosis Referring Physician: Dareen Piano MD  [name] is an 55 y.o. female.  HPI: Hx of cocaine crack smoking with recurrent problems. Has seen other Orthopedist including Dr. Louanne Skye in the past.   Past Medical History:  Diagnosis Date  . CAP (community acquired pneumonia) 03/2014 X 2  . Cocaine abuse    ongoing with resultant vaculitis.  . Daily headache   . Depression   . Hypertension   . Inflammatory arthritis ([place])   . Migraines    "probably 5-6/yr" (04/21/2014)  . Normocytic anemia    BL Hgb 9.8-12. Last anemia panel 04/2010 - showing Fe 19, ferritin 101.  Pt on monthly B12 injections  . Rheumatoid arthritis(714.0)    patient reported  . 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 Vasculitis ([place])    2/2 Levimasole toxicity. Followed by Dr. Louanne Skye    Past Surgical History:  Procedure Laterality Date  . HERNIA REPAIR     "stomach"  . I&D EXTREMITY Right 09/26/2015   Procedure: IRRIGATION AND DEBRIDEMENT LEG WOUND  VAC PLACEMENT.;  Surgeon: Loel Lofty Dillingham, DO;  Location: [place];  Service: Plastics;  Laterality: Right;  . IRRIGATION AND DEBRIDEMENT ABSCESS Bilateral 09/26/2013   Procedure: DEBRIDEMENT ULCERS BILATERAL THIGHS;  Surgeon: Gwenyth Ober, MD;  Location: [place];  Service: General;  Laterality: Bilateral;  . SKIN BIOPSY Bilateral 04/2010   shin nodules    Family History  Problem Relation Age of Onset  . Breast cancer Mother     Breast cancer  . Alcohol abuse Mother   . Colon cancer Maternal Aunt 44  . Alcohol abuse Father     Social History:  reports that she has been smoking Cigarettes.  She has a 10.20 pack-year smoking history. She has never used smokeless tobacco. She reports that she uses drugs, including "Crack" cocaine and Cocaine. She reports that she does not drink alcohol.  Allergies:  Allergies  Allergen Reactions  . Acetaminophen Swelling   REACTION: eyelid swelling    Medications: I have reviewed the patient's current medications.  Results for orders placed or performed during the hospital encounter of 04/27/16 (from the past 48 hour(s))  CBC with Differential/Platelet     Status: Abnormal   Collection Time: 04/27/16  6:15 AM  Result Value Ref Range   WBC 11.4 (H) 4.0 - 10.5 K/uL   RBC 4.31 3.87 - 5.11 MIL/uL   Hemoglobin 12.2 12.0 - 15.0 g/dL   HCT 36.4 36.0 - 46.0 %   MCV 84.5 78.0 - 100.0 fL   MCH 28.3 26.0 - 34.0 pg   MCHC 33.5 30.0 - 36.0 g/dL   RDW 16.8 (H) 11.5 - 15.5 %   Platelets 261 150 - 400 K/uL   Neutrophils Relative % 79 %   Neutro Abs 9.1 (H) 1.7 - 7.7 K/uL   Lymphocytes Relative 14 %   Lymphs Abs 1.6 0.7 - 4.0 K/uL   Monocytes Relative 6 %   Monocytes Absolute 0.6 0.1 - 1.0 K/uL   Eosinophils Relative 1 %   Eosinophils Absolute 0.1 0.0 - 0.7 K/uL   Basophils Relative 0 %   Basophils Absolute 0.0 0.0 - 0.1 K/uL  Basic metabolic panel     Status: Abnormal   Collection Time: 04/27/16  6:15 AM  Result Value Ref Range   Sodium 134 (L) 135 - 145 mmol/L   Potassium 4.0 3.5 -  5.1 mmol/L   Chloride 101 101 - 111 mmol/L   CO2 19 (L) 22 - 32 mmol/L   Glucose, Bld 115 (H) 65 - 99 mg/dL   BUN 56 (H) 6 - 20 mg/dL   Creatinine, Ser 5.84 (H) 0.44 - 1.00 mg/dL   Calcium 8.4 (L) 8.9 - 10.3 mg/dL   GFR calc non Af Amer 7 (L) >60 mL/min   GFR calc Af Amer 9 (L) >60 mL/min    Comment: (NOTE) The eGFR has been calculated using the CKD EPI equation. This calculation has not been validated in all clinical situations. eGFR's persistently <60 mL/min signify possible Chronic Kidney Disease.    Anion gap 14 5 - 15  Sedimentation rate     Status: Abnormal   Collection Time: 04/27/16  6:15 AM  Result Value Ref Range   Sed Rate 70 (H) 0 - 22 mm/hr  C-reactive protein     Status: Abnormal   Collection Time: 04/27/16  6:15 AM  Result Value Ref Range   CRP 14.4 (H) <1.0 mg/dL  Culture, blood (Routine X 2) w Reflex  to ID Panel     Status: None (Preliminary result)   Collection Time: 04/27/16  8:40 AM  Result Value Ref Range   Specimen Description BLOOD LEFT WRIST    Special Requests BOTTLES DRAWN AEROBIC AND ANAEROBIC 5CC    Culture NO GROWTH 1 DAY    Report Status PENDING   Culture, blood (Routine X 2) w Reflex to ID Panel     Status: None (Preliminary result)   Collection Time: 04/27/16  8:46 AM  Result Value Ref Range   Specimen Description BLOOD LEFT HAND    Special Requests BOTTLES DRAWN AEROBIC AND ANAEROBIC  5CC    Culture NO GROWTH 1 DAY    Report Status PENDING   Sodium, urine, random     Status: None   Collection Time: 04/27/16 10:00 AM  Result Value Ref Range   Sodium, Ur 63 mmol/L  Creatinine, urine, random     Status: None   Collection Time: 04/27/16 10:00 AM  Result Value Ref Range   Creatinine, Urine 130.08 mg/dL  Basic metabolic panel     Status: Abnormal   Collection Time: 04/28/16  3:54 AM  Result Value Ref Range   Sodium 136 135 - 145 mmol/L   Potassium 4.3 3.5 - 5.1 mmol/L   Chloride 109 101 - 111 mmol/L   CO2 17 (L) 22 - 32 mmol/L   Glucose, Bld 122 (H) 65 - 99 mg/dL   BUN 52 (H) 6 - 20 mg/dL   Creatinine, Ser 3.65 (H) 0.44 - 1.00 mg/dL    Comment: DELTA CHECK NOTED   Calcium 8.0 (L) 8.9 - 10.3 mg/dL   GFR calc non Af Amer 13 (L) >60 mL/min   GFR calc Af Amer 15 (L) >60 mL/min    Comment: (NOTE) The eGFR has been calculated using the CKD EPI equation. This calculation has not been validated in all clinical situations. eGFR's persistently <60 mL/min signify possible Chronic Kidney Disease.    Anion gap 10 5 - 15  CBC     Status: Abnormal   Collection Time: 04/28/16  3:54 AM  Result Value Ref Range   WBC 13.0 (H) 4.0 - 10.5 K/uL   RBC 3.83 (L) 3.87 - 5.11 MIL/uL   Hemoglobin 10.6 (L) 12.0 - 15.0 g/dL   HCT 32.5 (L) 36.0 - 46.0 %   MCV 84.9 78.0 - 100.0 fL  MCH 27.7 26.0 - 34.0 pg   MCHC 32.6 30.0 - 36.0 g/dL   RDW 16.9 (H) 11.5 - 15.5 %   Platelets 234  150 - 400 K/uL    Dg Hand Complete Left  Result Date: 04/27/2016 CLINICAL DATA:  55 year old female with swelling of the left middle finger. EXAM: LEFT HAND - COMPLETE 3+ VIEW COMPARISON:  None. FINDINGS: There is no acute fracture or dislocation. There is chronic erosive changes of the PIP joint of the fifth digit with ulnar subluxation. There is diffuse soft tissue swelling of the middle finger which may be infectious in etiology or related to underlying inflammatory process such as psoriatic arthritis. Clinical correlation is recommended. No radiopaque foreign object or soft tissue gas. IMPRESSION: Diffuse soft tissue swelling of the third digit may be infectious or inflammatory in etiology. Clinical correlation is recommended. No acute fracture or dislocation. Chronic erosive changes of the PIP joint of the fourth digit. Electronically Signed   By: Anner Crete M.D.   On: 04/27/2016 06:45  Dg Hand Complete Right  Result Date: 04/27/2016 CLINICAL DATA:  55 year old female with right index finger swelling. EXAM: RIGHT HAND - COMPLETE 3+ VIEW COMPARISON:  Radiograph dated 06/14/2015 FINDINGS: There is diffuse soft tissue swelling of the index finger, progressed compared to the prior study. This may be infectious in etiology or related to underlying inflammatory process such as psoriatic arthritis. Clinical correlation is recommended. There is no acute fracture for dislocation. The bones are mildly osteopenic. No significant arthritic changes. There is extension of the PIP with flexion of the DIP of the third digit. IMPRESSION: Diffuse soft tissue swelling of the index finger likely related to underlying inflammatory arthritis. Underlying infection is not excluded. Clinical correlation is recommended. Electronically Signed   By: Anner Crete M.D.   On: 04/27/2016 06:51   Review of Systems  Constitutional: Positive for weight loss.  Cardiovascular: Positive for chest pain.  Musculoskeletal:  Positive for myalgias.  Skin: Positive for rash.       Skin necrosis  Psychiatric/Behavioral: Positive for substance abuse.   Blood pressure (!) 146/71, pulse 97, temperature 98.2 F (36.8 C), temperature source Oral, resp. rate 17, SpO2 99 %. Physical Exam  Constitutional:  Thin appears older than her age  Eyes:  Missing teeth  Neck: Normal range of motion.  Musculoskeletal:  levimisole vasculitis with finger skin necrosis areas.     Assessment/Plan: Vasculitis, chronic crack cocaine abuse, Levimisole induced vasculitis.  I do not have treatment options . If she stops drug use they will resolve , if she continues she will get autoamputation with more skin necrosis.   My cell [phone]   Marybelle Killings 04/28/2016, 9:12 PM

## 2016-04-29 DIAGNOSIS — L958 Other vasculitis limited to the skin: Secondary | ICD-10-CM

## 2016-04-29 LAB — BASIC METABOLIC PANEL
Anion gap: 9 (ref 5–15)
BUN: 45 mg/dL — AB (ref 6–20)
CO2: 17 mmol/L — ABNORMAL LOW (ref 22–32)
CREATININE: 2.39 mg/dL — AB (ref 0.44–1.00)
Calcium: 8.6 mg/dL — ABNORMAL LOW (ref 8.9–10.3)
Chloride: 111 mmol/L (ref 101–111)
GFR calc Af Amer: 26 mL/min — ABNORMAL LOW (ref >60)
GFR, EST NON AFRICAN AMERICAN: 22 mL/min — AB (ref >60)
GLUCOSE: 102 mg/dL — AB (ref 65–99)
POTASSIUM: 4.3 mmol/L (ref 3.5–5.1)
Sodium: 137 mmol/L (ref 135–145)

## 2016-04-29 LAB — CBC
HEMATOCRIT: 30.5 % — AB (ref 36.0–46.0)
Hemoglobin: 9.9 g/dL — ABNORMAL LOW (ref 12.0–15.0)
MCH: 28 pg (ref 26.0–34.0)
MCHC: 32.5 g/dL (ref 30.0–36.0)
MCV: 86.2 fL (ref 78.0–100.0)
PLATELETS: 253 10*3/uL (ref 150–400)
RBC: 3.54 MIL/uL — ABNORMAL LOW (ref 3.87–5.11)
RDW: 16.8 % — AB (ref 11.5–15.5)
WBC: 13.3 10*3/uL — ABNORMAL HIGH (ref 4.0–10.5)

## 2016-04-29 MED ORDER — PREDNISONE 20 MG PO TABS
40.0000 mg | ORAL_TABLET | Freq: Every day | ORAL | Status: DC
  Administered 2016-04-30 – 2016-05-01 (×2): 40 mg via ORAL
  Filled 2016-04-29 (×2): qty 2

## 2016-04-29 MED ORDER — LISINOPRIL 10 MG PO TABS
10.0000 mg | ORAL_TABLET | Freq: Every day | ORAL | Status: DC
  Administered 2016-04-29 – 2016-05-01 (×3): 10 mg via ORAL
  Filled 2016-04-29 (×3): qty 1

## 2016-04-29 MED ORDER — VANCOMYCIN HCL 500 MG IV SOLR
500.0000 mg | INTRAVENOUS | Status: DC
  Administered 2016-04-29: 500 mg via INTRAVENOUS
  Filled 2016-04-29: qty 500

## 2016-04-29 MED ORDER — METRONIDAZOLE 0.75 % EX GEL
Freq: Two times a day (BID) | CUTANEOUS | Status: DC
  Administered 2016-04-29 – 2016-05-01 (×3): via TOPICAL
  Administered 2016-05-01: 1 via TOPICAL
  Filled 2016-04-29 (×2): qty 45

## 2016-04-29 MED ORDER — BOOST / RESOURCE BREEZE PO LIQD
1.0000 | Freq: Three times a day (TID) | ORAL | Status: DC
  Administered 2016-04-29 – 2016-05-01 (×4): 1 via ORAL

## 2016-04-29 NOTE — Progress Notes (Signed)
Pharmacy Antibiotic Note  [name] is a 55 y.o. female admitted on 04/27/2016 with cellulitis.  Pharmacy consulted for ceftriaxone and vancomycin dosing. 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 She was treated with antibiotics in the hospital, initially Ceftriaxone then Cefazolin and discharged on oral Keflex, which she states she may have missed a day as she has a few pills left. Keflex was anticipated to end on 7/5. Re-admitted 7/24 - she thinks her chronic RLE wound is er-infected b/c it is painful and has malodorous drainage. 2 new painful lesions on fingers.  ARF with creat 5.84 on admission, baseline creat usually < 1.  Superimposed cellulitis of chronic RLE wound.  Creat 5.84>3.65>2.39, creat cl ~ 19 ml/min. WBC 13.3. AF, vanc 1 gm given 7/24 at 1134 am. Wt 43.5 kg  Plan:  Continue Ceftriaxone 1 gm q24 Maintenance dose Vancomycin 500 mg IV q48, start at 12 noon today F/u renal fxn, wbc, temp, clinical course Vanc levels as needed  Temp (24hrs), Avg:98.2 F (36.8 C), Min:98.1 F (36.7 C), Max:98.2 F (36.8 C)   Recent Labs Lab 04/27/16 0615 04/28/16 0354 04/29/16 0501  WBC 11.4* 13.0* 13.3*  CREATININE 5.84* 3.65* 2.39*    Estimated Creatinine Clearance: 18.7 mL/min (by C-G formula based on SCr of 2.39 mg/dL).    Allergies  Allergen Reactions  . Acetaminophen Swelling    REACTION: eyelid swelling   Ceftriaxone 7/24>> Vanc 7/24>>  7/24 BC x2>>ngtd 6/28 MRSA PCR +   Thank you for allowing pharmacy to be a part of this patient's care.  Eudelia Bunch, Pharm.D. [phone] 04/29/2016 7:41 AM

## 2016-04-29 NOTE — Progress Notes (Signed)
Subjective: [name] is feeling a little better today with pain under somewhat better control. Twice daily bandage changes with IV morphine premedication seems to have helped. Additionally she is now using non-adhesive telfa bandages with bacitracin ointment for moisture, as xeroform bandages seem to dry up too easily. She changed her dressings already early this morning and says that her wounds look the same as yesterday, and continue to weep purulent malodorous fluids. We again discussed the need for cocaine abstinence on hospital discharge and the concern for impending loss of life and limb should she continue. She mentions that a common trigger for her cocaine use is sadness over lack of family (who she says she lost 20 years ago and continues to search for). We briefly discussed strategies to deal with these stressors and to adjust expectations.  Objective: Vital signs in last 24 hours: Vitals:   04/28/16 0634 04/28/16 1422 04/28/16 2320 04/29/16 0620  BP: 128/84 (!) 146/71  (!) 160/89  Pulse: 80 97  79  Resp: 16 17  17   Temp: 98.4 F (36.9 C) 98.2 F (36.8 C)  98.1 F (36.7 C)  TempSrc: Oral Oral  Oral  SpO2: 100% 99%  100%  Weight:   43.5 kg (96 lb)   Height:   5' 1.5" (1.562 m)     Intake/Output Summary (Last 24 hours) at 04/29/16 0810 Last data filed at 04/28/16 2032  Gross per 24 hour  Intake              480 ml  Output             1300 ml  Net             -820 ml   Physical Exam General appearance: Thin, ill-appearing woman lying in bed in moderate distress, tearful and moaning in pain HENT: 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 Cardiovascular: Mildly tachycardic but regular rate and rhythm, no murmurs, rubs, gallops Respiratory/Chest: Clear to ausculation bilaterally, normal work of breathing Abdomen: Bowel sounds present, soft, non-tender, non-distended Extremities: 2+ radial and pedal pulses, sensation intact,   1. Left hand 3rd digit, open ulceration on  extensor surface over PIP joint, with edematous, erythematous, weaping  2. Right hand 2nd digit, grossly edematous and erythematous PIP joint without ulceration  3. Right leg, Diffuse ulceration extending from posterior ankle proximally to knee. Distal lesion appears infected with erythema, purulent strong malodorous drainage diffusely warm with tenderness, right medial base of big toe 3x3cm purulent ulceration  4. Left leg, 3x3cm ulceration over medial ankle with minimal erythema/drainage Dusky nail beds remain about the same, cap refill < 2 seconds  Neuro: Cranial nerves grossly intact, alert and oriented Psych: Tearful and labile affect  Labs / Imaging / Procedures: CBC Latest Ref Rng & Units 04/29/2016 04/28/2016 04/27/2016  WBC 4.0 - 10.5 K/uL 13.3(H) 13.0(H) 11.4(H)  Hemoglobin 12.0 - 15.0 g/dL 9.9(L) 10.6(L) 12.2  Hematocrit 36.0 - 46.0 % 30.5(L) 32.5(L) 36.4  Platelets 150 - 400 K/uL 253 234 261   BMP Latest Ref Rng & Units 04/29/2016 04/28/2016 04/27/2016  Glucose 65 - 99 mg/dL 102(H) 122(H) 115(H)  BUN 6 - 20 mg/dL 45(H) 52(H) 56(H)  Creatinine 0.44 - 1.00 mg/dL 2.39(H) 3.65(H) 5.84(H)  BUN/Creat Ratio 9 - 23 - - -  Sodium 135 - 145 mmol/L 137 136 134(L)  Potassium 3.5 - 5.1 mmol/L 4.3 4.3 4.0  Chloride 101 - 111 mmol/L 111 109 101  CO2 22 - 32 mmol/L 17(L) 17(L)  19(L)  Calcium 8.9 - 10.3 mg/dL 8.6(L) 8.0(L) 8.4(L)   Blood cultures pending  Assessment/Plan:  [name] is a 55 y.o. 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  Cellulitis of chronic RLE wounds, diffuse purulent ulceration of multiple extremities, recent cocaine use 4 days ago, ongoing levamisole-induced vasculitis flare, concern for dusky fingertips  - Vancomycin and Ceftriaxone per pharmacy, will narrow coverage down to doxycycline tomorrow  - Blood cultures pending  - Last dose Solumedrol 80 mg  IV today, will start Prednisone 88m po QD tomorrow  - Moist dressing changes BID with morphine 271mIV pre-medication  - Added Metronidazole gel BID with dressing changes to treat wound odor  - Wound care contacted and approves of telfa/bacitracin bandage plan  Acute Renal Failure, likely pre-renal due to poor po intake, improving  - Encourage po intake  - avoid NSAIDs  - daily BMPs  HTN, worsened today, up to SBP 200  - Restarting home lisinopril 1034m- Amlodipine 5mg31mily  Chronic substance abuse  - Discussed abstinence/cessation  FEN/GI: NS IVF, regular diet DVT ppx: heparin TID  Dispo: Anticipated discharge in approximately 1-2 days.   LOS: 2 days   AdamAsencion Partridge 04/29/2016, 8:10 AM Pager: [phone]

## 2016-04-29 NOTE — Progress Notes (Signed)
Initial Nutrition Assessment  DOCUMENTATION CODES:   Underweight, Severe malnutrition in context of social or environmental circumstances  INTERVENTION:   -Boost Breeze po TID, each supplement provides 250 kcal and 9 grams of protein -Continue MVI daily  NUTRITION DIAGNOSIS:   Malnutrition related to social / environmental circumstances as evidenced by severe depletion of muscle mass, severe depletion of body fat.  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   Pt admitted with infected rt RLE wound.   Spoke with pt at bedside, who was drowsy at time of visit, but cooperative. She reports her appetite is usually fair, consuming 1 meal per day (usually a sandwich and soda). She reports that she did not eat for 2-3 days PTA, reporting "I just needed to sleep". Intake has improved since admission, noted 80-85% meal completion. Pt shares that she has no teeth and shows this RD a sore on her inner gum near her molars. She denies any difficulty chewing or swallowing as long as she receives "soft food".   Pt reports UBW is around 100#. Noted wt has ranged from 95-100# over the past 6 months. Pt reports "I think I gained it all back after being here".   Nutrition-Focused physical exam completed. Findings are moderate to severe fat depletion, moderate to severe muscle depletion, and no edema.   Reviewed [facility] note from 04/27/16; pt with levamisole vasculitis and full thickness wounds on lt leg, rt posterior leg, lt middle finger, and 1st rt finger.   Surgery consulted. No available options due to ongoing crack cocaine abuse.   Discussed  importance of good meal intake, particularly protein, to assist with wound healing. Pt requested Boost Breeze supplements, as she prefers them over Ensure/Boost products and recalls receiving them from a previous admission. RD to order.   Case discussed with RN.  Labs reviewed.   Diet Order:  Diet regular Room service appropriate? Yes; Fluid consistency: Thin  Skin:  Wound (see comment) (levamisole vasculitis, multiple full thickness wounds)  Last BM:  04/25/16  Height:   Ht Readings from Last 1 Encounters:  04/28/16 5' 1.5" (1.562 m)    Weight:   Wt Readings from Last 1 Encounters:  04/28/16 96 lb (43.5 kg)    Ideal Body Weight:  49.1 kg  BMI:  Body mass index is 17.85 kg/m.  Estimated Nutritional Needs:   Kcal:  1400-1600  Protein:  65-80 grams  Fluid:  1.4-1.6 L  EDUCATION NEEDS:   Education needs addressed  [name], RD, LDN, CDE Pager: [phone] After hours Pager: [phone]

## 2016-04-30 LAB — CBC
HEMATOCRIT: 30.7 % — AB (ref 36.0–46.0)
HEMOGLOBIN: 9.9 g/dL — AB (ref 12.0–15.0)
MCH: 27.7 pg (ref 26.0–34.0)
MCHC: 32.2 g/dL (ref 30.0–36.0)
MCV: 86 fL (ref 78.0–100.0)
PLATELETS: 277 10*3/uL (ref 150–400)
RBC: 3.57 MIL/uL — AB (ref 3.87–5.11)
RDW: 16.4 % — ABNORMAL HIGH (ref 11.5–15.5)
WBC: 9.7 10*3/uL (ref 4.0–10.5)

## 2016-04-30 LAB — BASIC METABOLIC PANEL
Anion gap: 8 (ref 5–15)
BUN: 31 mg/dL — ABNORMAL HIGH (ref 6–20)
CHLORIDE: 110 mmol/L (ref 101–111)
CO2: 22 mmol/L (ref 22–32)
CREATININE: 1.68 mg/dL — AB (ref 0.44–1.00)
Calcium: 8.2 mg/dL — ABNORMAL LOW (ref 8.9–10.3)
GFR calc non Af Amer: 34 mL/min — ABNORMAL LOW (ref >60)
GFR, EST AFRICAN AMERICAN: 39 mL/min — AB (ref >60)
Glucose, Bld: 95 mg/dL (ref 65–99)
Potassium: 4 mmol/L (ref 3.5–5.1)
Sodium: 140 mmol/L (ref 135–145)

## 2016-04-30 MED ORDER — DOXYCYCLINE HYCLATE 100 MG PO TABS: 100.0000 mg | ORAL_TABLET | Freq: Two times a day (BID) | ORAL | Status: DC

## 2016-04-30 MED ORDER — DOXYCYCLINE HYCLATE 100 MG PO TABS
100.0000 mg | ORAL_TABLET | Freq: Two times a day (BID) | ORAL | Status: DC
  Administered 2016-05-01: 100 mg via ORAL
  Filled 2016-04-30: qty 1

## 2016-04-30 NOTE — Progress Notes (Signed)
Pt a/o, morphine given prior to drsg changes, pt does own drsgs, VSS, pt stable

## 2016-04-30 NOTE — Plan of Care (Signed)
Patient having difficulty with ambulation/mobility, unsteadiness on her feet today due to leg pain. She has been mostly bed-bound up to this point. Per patient, PT session/attempt today was limited due to pain. Patient says she plans to practice walking around her room this afternoon and evening to loosen up her legs and her bandaged wounds. Agreeable to discharge home tomorrow morning when ambulation has improved.

## 2016-04-30 NOTE — Progress Notes (Signed)
Subjective: [name] continues to improve clinically. Pain seems better today. She did not change bandages yesterday evening but changed them this morning after 71m IV morphine and the process was much less painful than previously. Her wounds are less purulent and malodorous and the ulcerations have distinct granulation tissue and scab formation. Surrounding erythema and edema has significantly decreased as well. We started metronidazole gel with her bandage changes and this should help decrease surface bacterial burden and odor. Her leukocytosis and creatinine elevation continue to improve today. We discussed hand hygiene prior to dressing changes as well as soft debridement with saline or clean water. [name] agreeable to discharge later today with short-term follow up next week.  Objective: Vital signs in last 24 hours: Vitals:   04/29/16 1054 04/29/16 1619 04/29/16 2128 04/30/16 0500  BP: (!) 200/35 (!) 145/78 (!) 168/84 (!) 158/79  Pulse:  74 90 78  Resp:  17 19 17   Temp:  98.2 F (36.8 C) 97.9 F (36.6 C) 98 F (36.7 C)  TempSrc:  Oral Oral   SpO2:  100% 100% 100%  Weight:      Height:        Intake/Output Summary (Last 24 hours) at 04/30/16 1059 Last data filed at 04/30/16 09476 Gross per 24 hour  Intake              240 ml  Output             4600 ml  Net            -4360 ml   Physical Exam General appearance: Thin, ill-appearing woman lying in bed, no apparent distress, conversational, disheveled HENT: 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 Left hand 3rd digit, healing wound over first PIP joint with minimal erythema/edema, no longer draining, dry  2. Right hand 2nd digit, PIP joint with minimal edema  3. Right  leg, diffuse ulceration extending from posterior ankle proximally to knee, moist with minimal purulent discharge, mildly malodorous, granulation tissue present  4. Left leg, 3x3cm ulceration over medial ankle with minimal erythema/drainage Neuro: Cranial nerves grossly intact, alert and oriented Psych: Tearful and labile affect  Labs / Imaging / Procedures: CBC Latest Ref Rng & Units 04/30/2016 04/29/2016 04/28/2016  WBC 4.0 - 10.5 K/uL 9.7 13.3(H) 13.0(H)  Hemoglobin 12.0 - 15.0 g/dL 9.9(L) 9.9(L) 10.6(L)  Hematocrit 36.0 - 46.0 % 30.7(L) 30.5(L) 32.5(L)  Platelets 150 - 400 K/uL 277 253 234   BMP Latest Ref Rng & Units 04/30/2016 04/29/2016 04/28/2016  Glucose 65 - 99 mg/dL 95 102(H) 122(H)  BUN 6 - 20 mg/dL 31(H) 45(H) 52(H)  Creatinine 0.44 - 1.00 mg/dL 1.68(H) 2.39(H) 3.65(H)  BUN/Creat Ratio 9 - 23 - - -  Sodium 135 - 145 mmol/L 140 137 136  Potassium 3.5 - 5.1 mmol/L 4.0 4.3 4.3  Chloride 101 - 111 mmol/L 110 111 109  CO2 22 - 32 mmol/L 22 17(L) 17(L)  Calcium 8.9 - 10.3 mg/dL 8.2(L) 8.6(L) 8.0(L)   Blood cultures - no growth to date  Assessment/Plan:  Cristina SCHNITZERis a 55y.o. 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  Cellulitis of chronic RLE wounds,  resolving, diffuse purulent ulceration of multiple extremities, recent cocaine use 5 days ago, resolving levamisole-induced vasculitis flare  - Last dose IV CTX this AM, de-escalate antibiotics to Doxycycline 113m BID for 7 days, starting tomorrow  - Blood cultures NGTD  - Prednisone 435mpo QD, will taper on discharge  - Moist dressing changes BID with morphine 43m66mV pre-medication  - Metronidazole gel BID with dressing changes to treat wound odor  - Oxycodone PRN for pain control  Acute Renal Failure, likely pre-renal due to poor po intake, improving  - Encourage po intake  - avoid NSAIDs  - daily  BMPs  HTN, worsened today, up to SBP 200  - Restarted home lisinopril 34m17m Amlodipine 5mg 109mly  Chronic substance abuse  - Discussed abstinence/cessation  FEN/GI: NS IVF, regular diet DVT ppx: heparin TID  Dispo: Anticipated discharge this afternoon.   LOS: 3 days   [name]7/27/2017, 10:59 AM Pager: [phone]

## 2016-04-30 NOTE — Evaluation (Signed)
Physical Therapy Evaluation Patient Details Name: [name] MRN: [id number] DOB: Feb 03, 1961 Today's Date: 04/30/2016   History of Present Illness  55 y.o. 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 PMH: vasculitis, RA, hypertension, depression, cocaine abuse.   Clinical Impression  Pt admitted with above diagnosis. Pt currently with functional limitations due to the deficits listed below (see PT Problem List).  Pt will benefit from skilled PT to increase their independence and safety with mobility to allow discharge to the venue listed below. Pt states that she will have help available all the time while at home from friends. Pt mobilized well with rw but avoided putting weight through her Rt LE, observed increased pain following ambulation in the Rt LE that improved with time. Pt states that she feels like she can go home, recommended use of rw and assistance for safety with ambulation. Pt denies any questions following session.        Follow Up Recommendations Supervision for mobility/OOB    Equipment Recommendations  Rolling walker with 5" wheels    Recommendations for Other Services       Precautions / Restrictions Precautions Precautions: Fall Restrictions Weight Bearing Restrictions: No      Mobility  Bed Mobility               General bed mobility comments: in chair upon arrival  Transfers Overall transfer level: Modified independent Equipment used: Rolling walker (2 wheeled)             General transfer comment: no assistance needed to come to standing.   Ambulation/Gait Ambulation/Gait assistance: Supervision Ambulation Distance (Feet): 35 Feet Assistive device: Rolling walker (2 wheeled) Gait Pattern/deviations:  (swing-to pattern) Gait velocity: slow pattern   General Gait Details: Pt avoiding weight through her Rt LE with reports of  pain as primary limitation.   Stairs            Wheelchair Mobility    Modified Rankin (Stroke Patients Only)       Balance Overall balance assessment: Needs assistance Sitting-balance support: No upper extremity supported Sitting balance-Leahy Scale: Good     Standing balance support: Bilateral upper extremity supported Standing balance-Leahy Scale: Poor Standing balance comment: using rw for support                             Pertinent Vitals/Pain Pain Assessment: Faces Faces Pain Scale: Hurts whole lot (following ambulation) Pain Location: Rt LE Pain Descriptors / Indicators: Crying;Moaning Pain Intervention(s): Monitored during session;Limited activity within patient's tolerance (pt declined requesting pain medication)    Home Living Family/patient expects to be discharged to:: Private residence Living Arrangements: Non-relatives/Friends Available Help at Discharge: Friend(s);Available 24 hours/day Type of Home: Other(Comment) (boarding house) Home Access: Level entry     Home Layout: One level Home Equipment: None Additional Comments: Pt states that she doesn't have to go up or down any steps to get into her home.     Prior Function Level of Independence: Independent               Hand Dominance        Extremity/Trunk Assessment   Upper Extremity Assessment: Overall WFL for tasks assessed           Lower Extremity Assessment: Generalized weakness         Communication   Communication: No  difficulties  Cognition Arousal/Alertness: Awake/alert Behavior During Therapy: Flat affect Overall Cognitive Status: Within Functional Limits for tasks assessed                      General Comments      Exercises        Assessment/Plan    PT Assessment Patient needs continued PT services  PT Diagnosis Difficulty walking;Generalized weakness   PT Problem List Decreased strength;Decreased activity  tolerance;Decreased balance;Decreased mobility  PT Treatment Interventions DME instruction;Gait training;Stair training;Functional mobility training;Therapeutic activities;Therapeutic exercise;Patient/family education   PT Goals (Current goals can be found in the Care Plan section) Acute Rehab PT Goals Patient Stated Goal: get out of the hospital PT Goal Formulation: With patient Time For Goal Achievement: 05/14/16 Potential to Achieve Goals: Fair    Frequency Min 3X/week   Barriers to discharge        Co-evaluation               End of Session Equipment Utilized During Treatment:  (pt refused use of gait belt) Activity Tolerance: Patient limited by pain Patient left: in chair;with call bell/phone within reach Nurse Communication: Mobility status         Time: 1130-1153 PT Time Calculation (min) (ACUTE ONLY): 23 min   Charges:   PT Evaluation $PT Eval Moderate Complexity: 1 Procedure PT Treatments $Gait Training: 8-22 mins   PT G Codes:        Cassell Clement, PT, CSCS Pager [phone] Office [phone]  04/30/2016, 2:03 PM

## 2016-05-01 DIAGNOSIS — B9689 Other specified bacterial agents as the cause of diseases classified elsewhere: Secondary | ICD-10-CM

## 2016-05-01 DIAGNOSIS — N179 Acute kidney failure, unspecified: Secondary | ICD-10-CM

## 2016-05-01 LAB — CBC
HEMATOCRIT: 30.9 % — AB (ref 36.0–46.0)
HEMOGLOBIN: 10.6 g/dL — AB (ref 12.0–15.0)
MCH: 28.2 pg (ref 26.0–34.0)
MCHC: 34.3 g/dL (ref 30.0–36.0)
MCV: 82.2 fL (ref 78.0–100.0)
Platelets: 275 10*3/uL (ref 150–400)
RBC: 3.76 MIL/uL — AB (ref 3.87–5.11)
RDW: 15.8 % — ABNORMAL HIGH (ref 11.5–15.5)
WBC: 10.3 10*3/uL (ref 4.0–10.5)

## 2016-05-01 LAB — BASIC METABOLIC PANEL
ANION GAP: 8 (ref 5–15)
BUN: 24 mg/dL — ABNORMAL HIGH (ref 6–20)
CHLORIDE: 107 mmol/L (ref 101–111)
CO2: 24 mmol/L (ref 22–32)
Calcium: 8.5 mg/dL — ABNORMAL LOW (ref 8.9–10.3)
Creatinine, Ser: 1.21 mg/dL — ABNORMAL HIGH (ref 0.44–1.00)
GFR calc non Af Amer: 50 mL/min — ABNORMAL LOW (ref >60)
GFR, EST AFRICAN AMERICAN: 58 mL/min — AB (ref >60)
Glucose, Bld: 111 mg/dL — ABNORMAL HIGH (ref 65–99)
POTASSIUM: 3.4 mmol/L — AB (ref 3.5–5.1)
SODIUM: 139 mmol/L (ref 135–145)

## 2016-05-01 MED ORDER — POTASSIUM CHLORIDE CRYS ER 20 MEQ PO TBCR
40.0000 meq | EXTENDED_RELEASE_TABLET | Freq: Once | ORAL | Status: AC
  Administered 2016-05-01: 40 meq via ORAL
  Filled 2016-05-01: qty 2

## 2016-05-01 MED ORDER — BOOST / RESOURCE BREEZE PO LIQD: 1.0000 | Freq: Three times a day (TID) | ORAL | 3 refills | Status: DC

## 2016-05-01 MED ORDER — OXYCODONE HCL 5 MG PO TABS: 5.0000 mg | ORAL_TABLET | Freq: Four times a day (QID) | ORAL | 0 refills | Status: DC | PRN

## 2016-05-01 MED ORDER — "XEROFORM PETROLAT GAUZE 5""X9"" EX MISC": CUTANEOUS | 0 refills | Status: DC

## 2016-05-01 MED ORDER — BACITRACIN ZINC 500 UNIT/GM EX OINT: TOPICAL_OINTMENT | Freq: Two times a day (BID) | CUTANEOUS | 0 refills | Status: DC

## 2016-05-01 MED ORDER — PREDNISONE 20 MG PO TABS: ORAL_TABLET | ORAL | 0 refills | Status: DC

## 2016-05-01 MED ORDER — METRONIDAZOLE 0.75 % EX GEL: Freq: Two times a day (BID) | CUTANEOUS | 0 refills | Status: DC

## 2016-05-01 MED ORDER — DOXYCYCLINE HYCLATE 100 MG PO TABS: 100.0000 mg | ORAL_TABLET | Freq: Two times a day (BID) | ORAL | 0 refills | Status: AC

## 2016-05-01 NOTE — Care Management Note (Addendum)
Case Management Note  Patient Details  Name: [name] MRN: [id number] Date of Birth: July 11, 1961  Subjective/Objective:                    Action/Plan:   Expected Discharge Date:                  Expected Discharge Plan:  Home/Self Care  In-House Referral:     Discharge planning Services  CM Consult  Post Acute Care Choice:  Durable Medical Equipment Choice offered to:     DME Arranged:  Walker rolling DME Agency:  [place]:    [facility] Agency:     Status of Service:  Completed, signed off  If discussed at [place] of Stay Meetings, dates discussed:    Additional Comments: Patient received a walker on 10/13/14 Medicaid will not cover another one . Patient states she does have a walker at home   Marilu Favre, RN 05/01/2016, 8:42 AM

## 2016-05-01 NOTE — Progress Notes (Signed)
PT Cancellation Note  Patient Details Name: [name] MRN: [id number] DOB: [birth year]   Cancelled Treatment:    Reason Eval/Treat Not Completed: Patient declined, no reason specified (Pt agitated awaiting d/c and refused OOB.  Pt reports, " I am suppose to leave.  I have already walked around outside today.")   Alexzavier Girardin Eli Hose 05/01/2016, 3:14 PM  Governor Rooks, PTA pager [phone]

## 2016-05-01 NOTE — Progress Notes (Signed)
Subjective: [name] continues to improve today. She walked with physical therapy yesterday, and is planning to walk and loosen up her legs after breakfast this morning. Pain from her wounds is still significant but manageable with medication. Witnessed dressing change of her wounds and patient is experiencing much less pain and her ulcers appear to be healing well, no odor present today. Photographed her wounds for documentation.  Objective: Vital signs in last 24 hours: Vitals:   04/30/16 1419 04/30/16 2241 05/01/16 0534 05/01/16 0600  BP: (!) 165/82 (!) 195/96 (!) 202/120 140/76  Pulse: 89 64 68   Resp: 17 17    Temp: 97.8 F (36.6 C) 98.4 F (36.9 C) 98.4 F (36.9 C)   TempSrc: Oral Oral Oral   SpO2: 100% 99% 100%   Weight:      Height:        Intake/Output Summary (Last 24 hours) at 05/01/16 0759 Last data filed at 05/01/16 0534  Gross per 24 hour  Intake              960 ml  Output             2300 ml  Net            -1340 ml   Physical Exam General appearance: Thin, ill-appearing woman lying in bed, no apparent distress, conversational, disheveled HENT: 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 Cardiovascular: Mildly tachycardic but regular rate and rhythm, no murmurs, rubs, gallops Respiratory/Chest: Clear to ausculation bilaterally, normal work of breathing Extremities: 2+ radial and pedal pulses, sensation intact, cap refill < 2 seconds, extensive healing ulcerative wounds of lower extremities and fingers bilaterally Psych: Labile affect  Labs / Imaging / Procedures: CBC Latest Ref Rng & Units 05/01/2016 04/30/2016 04/29/2016  WBC 4.0 - 10.5 K/uL 10.3 9.7 13.3(H)  Hemoglobin 12.0 - 15.0 g/dL 10.6(L) 9.9(L) 9.9(L)  Hematocrit 36.0 - 46.0 % 30.9(L) 30.7(L) 30.5(L)  Platelets 150 - 400 K/uL 275 277 253   BMP Latest Ref Rng & Units 05/01/2016 04/30/2016 04/29/2016  Glucose 65 - 99 mg/dL 111(H) 95 102(H)  BUN 6 - 20 mg/dL 24(H) 31(H) 45(H)  Creatinine 0.44 - 1.00 mg/dL  1.21(H) 1.68(H) 2.39(H)  BUN/Creat Ratio 9 - 23 - - -  Sodium 135 - 145 mmol/L 139 140 137  Potassium 3.5 - 5.1 mmol/L 3.4(L) 4.0 4.3  Chloride 101 - 111 mmol/L 107 110 111  CO2 22 - 32 mmol/L 24 22 17(L)  Calcium 8.9 - 10.3 mg/dL 8.5(L) 8.2(L) 8.6(L)   Blood cultures - no growth to date  Assessment/Plan:  [name] is a 55 y.o. 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  Cellulitis of chronic RLE wounds, resolving, diffuse purulent ulceration of multiple extremities, recent cocaine use 5 days ago, resolving levamisole-induced vasculitis flare  - Doxycycline 170m BID for 7 days, starting today  - Blood cultures NGTD  - Prednisone 441mpo QD, will taper on discharge  - Moist dressing changes BID with morphine 48m36mV pre-medication  - Metronidazole gel BID with dressing changes to treat wound odor  - Oxycodone PRN for pain control  Acute Renal Failure, likely pre-renal due to poor po intake, improving  - Encourage po intake  - avoid NSAIDs  - daily BMPs  HTN, worsened today, up to SBP 200  - Restarted home lisinopril 54m53m Amlodipine 5mg 648mly  Chronic substance  abuse  - Discussed abstinence/cessation  FEN/GI: NS IVF, regular diet DVT ppx: heparin TID  Dispo: Anticipated discharge late morning or early afternoon.   LOS: 4 days   [name], MD 05/01/2016, 7:59 AM Pager: [phone]

## 2016-05-01 NOTE — Progress Notes (Signed)
Pt discharge paper reviewed and pt understand content. Supplies sent home with pt for dressing changes.

## 2016-05-02 LAB — CULTURE, BLOOD (ROUTINE X 2)
CULTURE: NO GROWTH
Culture: NO GROWTH

## 2016-05-02 NOTE — Discharge Summary (Signed)
Name: [name] MRN: [id number] DOB: 1961-03-14 55 y.o. PCP: Zada Finders, MD  Date of Admission: 04/27/2016  5:08 AM Date of Discharge: 05/01/2016 Attending Physician: Aldine Contes, MD  Discharge Diagnosis: 1. 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 Acute renal failure  Principal Problem:   Acute renal failure (ARF) (HCC) Active Problems:   Essential hypertension   Cocaine dependence ([place])   Cocaine-induced vascular disorder (HCC)   Major depression, recurrent (HCC)   Cannabis use disorder, severe, dependence ([place])   Cellulitis   AKI (acute kidney injury) ([place])   Discharge Medications:   Medication List    STOP taking these medications   naproxen sodium 220 MG tablet Commonly known as:  ANAPROX     TAKE these medications   bacitracin ointment Apply topically 2 (two) times daily.   doxycycline 100 MG tablet Commonly known as:  VIBRA-TABS Take 1 tablet (100 mg total) by mouth every 12 (twelve) hours.   feeding supplement Liqd Take 1 Container by mouth 3 (three) times daily between meals.   lisinopril 10 MG tablet Commonly known as:  PRINIVIL,ZESTRIL Take 1 tablet (10 mg total) by mouth daily.   metroNIDAZOLE 0.75 % gel Commonly known as:  METROGEL Apply topically 2 (two) times daily.   oxyCODONE 5 MG immediate release tablet Commonly known as:  Oxy IR/ROXICODONE Take 1 tablet (5 mg total) by mouth every 6 (six) hours as needed for severe pain.   predniSONE 20 MG tablet Commonly known as:  DELTASONE 2 tablet daily with breakfast for 1 week, then 1.5 tablet daily for 1 week, then 1 tablet daily for 1 week, then half tablet for 1 week.   XEROFORM PETROLAT GAUZE 5"X9" Misc Change on left lower leg wound 1-2 times daily       Disposition and follow-up:   [name] was discharged from [facility] in Stable condition.  At the hospital follow up visit please address:  1.  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  Labs / imaging needed at time of follow-up: BMP  3.  Pending labs/ test needing follow-up: None  Follow-up Appointments: Follow-up Information    [place]. Go on 05/08/2016.   Why:  Hospital follow up appointment at 10:15 AM Contact information: 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 Levamisole-induced vasculitis with superimposed cellulitis - presented on 7/24 with infected bilateral finger wounds, RLE wound with malodorous purulent drainage. Seen by wound care and hand surgery, recommended ongoing dressing changes, antibiotics, and counseling in cocaine cessation. She received IV Solumedrol for 3 days along with IV ceftriaxone for 4 days before transitioning  to Doxycycline po on day of discharge (7/28). Dressing changes BID required morphine pre-medication and utilized non-adhesive Telfa bandages with Metronidazole gel for moisture and odor control. Wounds improved, drainage/odor stopped, pain decreased, an granulation tissue increasing. She was stable for  discharge and ambulatory on 7/28. Discharged with 7 day course Doxycycline, Prednisone taper (4 weeks, 6m, 30, 20, 10), and IMC follow up in 1 week.  2. Acute renal failure - presented on 7/24 with Cr 5.84 and had been taking Naproxen for two weeks for pain control and Ibuprofen for two weeks before that, also N/V and poor po intake present for several days prior to admission and taking Lisinopril for HTN. Discontinued all NSAIDs, held lisinopril, and gave IVF, good po intake, and renal function quickly recovered, Cr down-trending daily and 1.21 by discharge on 7/28 (baselin 0.6-0.8).  Discharge Vitals:   BP (!) 158/86 (BP Location: Right Arm)   Pulse 100   Temp 98.3 F (36.8 C) (Oral)   Resp 16   Ht 5' 1.5" (1.562 m)   Wt 43.5 kg (96 lb)   SpO2 100%   BMI 17.85 kg/m   Pertinent Labs, Studies, and Procedures:  CBC Latest Ref Rng & Units 05/01/2016 04/30/2016 04/29/2016  WBC 4.0 - 10.5 K/uL 10.3 9.7 13.3(H)  Hemoglobin 12.0 - 15.0 g/dL 10.6(L) 9.9(L) 9.9(L)  Hematocrit 36.0 - 46.0 % 30.9(L) 30.7(L) 30.5(L)  Platelets 150 - 400 K/uL 275 277 253   BMP Latest Ref Rng & Units 05/01/2016 04/30/2016 04/29/2016  Glucose 65 - 99 mg/dL 111(H) 95 102(H)  BUN 6 - 20 mg/dL 24(H) 31(H) 45(H)  Creatinine 0.44 - 1.00 mg/dL 1.21(H) 1.68(H) 2.39(H)  BUN/Creat Ratio 9 - 23 - - -  Sodium 135 - 145 mmol/L 139 140 137  Potassium 3.5 - 5.1 mmol/L 3.4(L) 4.0 4.3  Chloride 101 - 111 mmol/L 107 110 111  CO2 22 - 32 mmol/L 24 22 17(L)  Calcium 8.9 - 10.3 mg/dL 8.5(L) 8.2(L) 8.6(L)   DG HAND COMPLETE RIGHT 04/27/2016 IMPRESSION: Diffuse soft tissue swelling of the index finger likely related to underlying inflammatory arthritis. Underlying infection is not excluded. Clinical correlation is recommended.  DEmbarrassHAND COMPLETE LEFT 04/27/2016 IMPRESSION: Diffuse soft tissue swelling of the third digit may be infectious or inflammatory in etiology. Clinical correlation is recommended. No acute fracture or  dislocation. Chronic erosive changes of the PIP joint of the fourth digit.  Photos of wounds at discharge 05/01/16:             Discharge Instructions: Discharge Instructions    Call MD for:  difficulty breathing, headache or visual disturbances    Complete by:  As directed   Call MD for:  extreme fatigue    Complete by:  As directed   Call MD for:  persistant nausea and vomiting    Complete by:  As directed   Call MD for:  redness, tenderness, or signs of infection (pain, swelling, redness, odor or green/yellow discharge around incision site)    Complete by:  As directed   Call MD for:  severe uncontrolled pain    Complete by:  As directed   Call MD for:  temperature >100.4    Complete by:  As directed   Diet - low sodium heart healthy    Complete by:  As directed   Discharge instructions    Complete by:  As directed   Please continue to take your medications as prescribed and attend your hospital follow up appointment. It  is very important for you to take your antibiotics (Doxycycline) and steroids (Prednisone). Your steroid taper will be 2 tablets with breakfast for one week, 1.5 tablets the next, 1 tablet the next, and one final week with half a tablet with breakfast. Please continue to change your dressings daily and make sure you wash your hands with hot soapy water beforehand. Use the Metrogel to moisten your bandages as this antibiotic gel will help with the smell and fight the bacteria. Also remember to lightly wash your wounds with saline or clean water before applying fresh bandages.  Again it is absolutely essential that you find a way to stop using cocaine. Avoid situations or people that tempt you to use cocaine, and when you are feeling stressed or sad find a healthy way to deal with it. Talk to a friend or watch TV, walk outside, etc. When you are feeling tempted remind yourself of the repercussions and how painful these infected wounds will become. Continuing to use  cocaine will likely cause you to lose your fingers, arms, legs, and eventually your life.   If you have any questions or concerns, please call the clinic at [phone] or if it is the weekend or after hours, you may call [phone] and ask for the internal medicine resident on call. Please call or visit the ER if your wound infections get worse or you develop fevers, nausea, vomiting, pain, etc.   Increase activity slowly    Complete by:  As directed      Signed: [name], MD 05/02/2016, 10:50 PM   Pager: [phone]

## 2016-05-08 ENCOUNTER — Ambulatory Visit: Payer: Self-pay

## 2016-05-14 ENCOUNTER — Encounter (HOSPITAL_BASED_OUTPATIENT_CLINIC_OR_DEPARTMENT_OTHER): Payer: Medicaid Other

## 2016-05-18 ENCOUNTER — Inpatient Hospital Stay (HOSPITAL_COMMUNITY)
Admission: EM | Admit: 2016-05-18 | Discharge: 2016-05-23 | DRG: 255 | Disposition: A | Discharge status: Home / Self Care | Payer: Medicaid Other | Attending: Oncology | Admitting: Oncology

## 2016-05-18 ENCOUNTER — Encounter (HOSPITAL_COMMUNITY): Payer: Self-pay

## 2016-05-18 DIAGNOSIS — L089 Local infection of the skin and subcutaneous tissue, unspecified: Secondary | ICD-10-CM

## 2016-05-18 DIAGNOSIS — T148XXA Other injury of unspecified body region, initial encounter: Secondary | ICD-10-CM

## 2016-05-18 DIAGNOSIS — M069 Rheumatoid arthritis, unspecified: Secondary | ICD-10-CM | POA: Diagnosis present

## 2016-05-18 DIAGNOSIS — E43 Unspecified severe protein-calorie malnutrition: Secondary | ICD-10-CM | POA: Diagnosis present

## 2016-05-18 DIAGNOSIS — F14288 Cocaine dependence with other cocaine-induced disorder: Secondary | ICD-10-CM | POA: Diagnosis not present

## 2016-05-18 DIAGNOSIS — T374X4A Poisoning by anthelminthics, undetermined, initial encounter: Secondary | ICD-10-CM | POA: Diagnosis present

## 2016-05-18 DIAGNOSIS — I96 Gangrene, not elsewhere classified: Secondary | ICD-10-CM | POA: Insufficient documentation

## 2016-05-18 DIAGNOSIS — X58XXXA Exposure to other specified factors, initial encounter: Secondary | ICD-10-CM | POA: Diagnosis not present

## 2016-05-18 DIAGNOSIS — F1410 Cocaine abuse, uncomplicated: Secondary | ICD-10-CM

## 2016-05-18 DIAGNOSIS — F17210 Nicotine dependence, cigarettes, uncomplicated: Secondary | ICD-10-CM | POA: Diagnosis present

## 2016-05-18 DIAGNOSIS — Z886 Allergy status to analgesic agent status: Secondary | ICD-10-CM

## 2016-05-18 DIAGNOSIS — I776 Arteritis, unspecified: Secondary | ICD-10-CM

## 2016-05-18 DIAGNOSIS — Z79899 Other long term (current) drug therapy: Secondary | ICD-10-CM

## 2016-05-18 DIAGNOSIS — I10 Essential (primary) hypertension: Secondary | ICD-10-CM | POA: Diagnosis present

## 2016-05-18 DIAGNOSIS — M318 Other specified necrotizing vasculopathies: Principal | ICD-10-CM | POA: Diagnosis present

## 2016-05-18 DIAGNOSIS — I999 Unspecified disorder of circulatory system: Secondary | ICD-10-CM

## 2016-05-18 DIAGNOSIS — F1420 Cocaine dependence, uncomplicated: Secondary | ICD-10-CM | POA: Diagnosis present

## 2016-05-18 DIAGNOSIS — S63253A Unspecified dislocation of left middle finger, initial encounter: Secondary | ICD-10-CM | POA: Diagnosis not present

## 2016-05-18 DIAGNOSIS — Z681 Body mass index (BMI) 19 or less, adult: Secondary | ICD-10-CM

## 2016-05-18 DIAGNOSIS — S61203A Unspecified open wound of left middle finger without damage to nail, initial encounter: Secondary | ICD-10-CM | POA: Diagnosis present

## 2016-05-18 DIAGNOSIS — L958 Other vasculitis limited to the skin: Secondary | ICD-10-CM | POA: Diagnosis not present

## 2016-05-18 DIAGNOSIS — S63273A Dislocation of unspecified interphalangeal joint of left middle finger, initial encounter: Secondary | ICD-10-CM | POA: Diagnosis present

## 2016-05-18 DIAGNOSIS — F329 Major depressive disorder, single episode, unspecified: Secondary | ICD-10-CM | POA: Diagnosis present

## 2016-05-18 DIAGNOSIS — G894 Chronic pain syndrome: Secondary | ICD-10-CM

## 2016-05-18 DIAGNOSIS — E876 Hypokalemia: Secondary | ICD-10-CM | POA: Diagnosis present

## 2016-05-18 DIAGNOSIS — F14988 Cocaine use, unspecified with other cocaine-induced disorder: Secondary | ICD-10-CM

## 2016-05-18 DIAGNOSIS — Z9119 Patient's noncompliance with other medical treatment and regimen: Secondary | ICD-10-CM

## 2016-05-18 DIAGNOSIS — R64 Cachexia: Secondary | ICD-10-CM | POA: Diagnosis present

## 2016-05-18 DIAGNOSIS — M064 Inflammatory polyarthropathy: Secondary | ICD-10-CM | POA: Diagnosis present

## 2016-05-18 DIAGNOSIS — S81801A Unspecified open wound, right lower leg, initial encounter: Secondary | ICD-10-CM | POA: Diagnosis present

## 2016-05-18 LAB — CBC WITH DIFFERENTIAL/PLATELET
Basophils Absolute: 0 10*3/uL (ref 0.0–0.1)
Basophils Relative: 0 %
EOS ABS: 0.1 10*3/uL (ref 0.0–0.7)
EOS PCT: 0 %
HCT: 39.9 % (ref 36.0–46.0)
Hemoglobin: 13.1 g/dL (ref 12.0–15.0)
LYMPHS ABS: 2.9 10*3/uL (ref 0.7–4.0)
LYMPHS PCT: 22 %
MCH: 28.8 pg (ref 26.0–34.0)
MCHC: 32.8 g/dL (ref 30.0–36.0)
MCV: 87.7 fL (ref 78.0–100.0)
MONO ABS: 0.7 10*3/uL (ref 0.1–1.0)
Monocytes Relative: 5 %
Neutro Abs: 9.9 10*3/uL — ABNORMAL HIGH (ref 1.7–7.7)
Neutrophils Relative %: 73 %
PLATELETS: 285 10*3/uL (ref 150–400)
RBC: 4.55 MIL/uL (ref 3.87–5.11)
RDW: 17.1 % — AB (ref 11.5–15.5)
WBC: 13.6 10*3/uL — ABNORMAL HIGH (ref 4.0–10.5)

## 2016-05-18 LAB — URINALYSIS, ROUTINE W REFLEX MICROSCOPIC
Bilirubin Urine: NEGATIVE
GLUCOSE, UA: 250 mg/dL — AB
Ketones, ur: NEGATIVE mg/dL
Nitrite: NEGATIVE
PH: 6.5 (ref 5.0–8.0)
PROTEIN: 30 mg/dL — AB
SPECIFIC GRAVITY, URINE: 1.016 (ref 1.005–1.030)

## 2016-05-18 LAB — RAPID URINE DRUG SCREEN, HOSP PERFORMED
AMPHETAMINES: NOT DETECTED
BENZODIAZEPINES: NOT DETECTED
Barbiturates: NOT DETECTED
Cocaine: POSITIVE — AB
OPIATES: POSITIVE — AB
Tetrahydrocannabinol: NOT DETECTED

## 2016-05-18 LAB — COMPREHENSIVE METABOLIC PANEL
ALBUMIN: 3.9 g/dL (ref 3.5–5.0)
ALK PHOS: 57 U/L (ref 38–126)
ALT: 10 U/L — ABNORMAL LOW (ref 14–54)
ANION GAP: 8 (ref 5–15)
AST: 18 U/L (ref 15–41)
BILIRUBIN TOTAL: 0.8 mg/dL (ref 0.3–1.2)
BUN: 18 mg/dL (ref 6–20)
CALCIUM: 8.9 mg/dL (ref 8.9–10.3)
CO2: 25 mmol/L (ref 22–32)
Chloride: 106 mmol/L (ref 101–111)
Creatinine, Ser: 0.93 mg/dL (ref 0.44–1.00)
GFR calc Af Amer: >60 mL/min (ref >60)
GLUCOSE: 183 mg/dL — AB (ref 65–99)
Potassium: 3.4 mmol/L — ABNORMAL LOW (ref 3.5–5.1)
Sodium: 139 mmol/L (ref 135–145)
TOTAL PROTEIN: 7.2 g/dL (ref 6.5–8.1)

## 2016-05-18 LAB — I-STAT CG4 LACTIC ACID, ED
LACTIC ACID, VENOUS: 0.43 mmol/L — AB (ref 0.5–1.9)
Lactic Acid, Venous: 1.51 mmol/L (ref 0.5–1.9)

## 2016-05-18 LAB — I-STAT BETA HCG BLOOD, ED (MC, WL, AP ONLY)

## 2016-05-18 LAB — URINE MICROSCOPIC-ADD ON

## 2016-05-18 MED ORDER — MORPHINE SULFATE (PF) 2 MG/ML IV SOLN
2.0000 mg | INTRAVENOUS | Status: DC | PRN
  Administered 2016-05-18 – 2016-05-23 (×12): 2 mg via INTRAVENOUS
  Filled 2016-05-18 (×13): qty 1

## 2016-05-18 MED ORDER — LISINOPRIL 10 MG PO TABS
10.0000 mg | ORAL_TABLET | Freq: Every day | ORAL | Status: DC
  Administered 2016-05-18 – 2016-05-23 (×6): 10 mg via ORAL
  Filled 2016-05-18 (×6): qty 1

## 2016-05-18 MED ORDER — SODIUM CHLORIDE 0.9 % IV BOLUS (SEPSIS)
1000.0000 mL | Freq: Once | INTRAVENOUS | Status: AC
  Administered 2016-05-18: 1000 mL via INTRAVENOUS

## 2016-05-18 MED ORDER — VANCOMYCIN HCL IN DEXTROSE 750-5 MG/150ML-% IV SOLN
750.0000 mg | INTRAVENOUS | Status: AC
  Administered 2016-05-18: 750 mg via INTRAVENOUS
  Filled 2016-05-18: qty 150

## 2016-05-18 MED ORDER — HYDROMORPHONE HCL 1 MG/ML IJ SOLN
1.0000 mg | Freq: Once | INTRAMUSCULAR | Status: AC
  Administered 2016-05-18: 1 mg via INTRAVENOUS
  Filled 2016-05-18: qty 1

## 2016-05-18 MED ORDER — ENOXAPARIN SODIUM 30 MG/0.3ML ~~LOC~~ SOLN
30.0000 mg | SUBCUTANEOUS | Status: DC
  Administered 2016-05-19 – 2016-05-22 (×4): 30 mg via SUBCUTANEOUS
  Filled 2016-05-18 (×4): qty 0.3

## 2016-05-18 MED ORDER — METHYLPREDNISOLONE SODIUM SUCC 125 MG IJ SOLR
125.0000 mg | Freq: Once | INTRAMUSCULAR | Status: AC
  Administered 2016-05-18: 125 mg via INTRAVENOUS
  Filled 2016-05-18: qty 2

## 2016-05-18 MED ORDER — ONDANSETRON HCL 4 MG/2ML IJ SOLN: 4.0000 mg | Freq: Three times a day (TID) | INTRAMUSCULAR | Status: DC | PRN

## 2016-05-18 MED ORDER — BOOST / RESOURCE BREEZE PO LIQD
1.0000 | Freq: Three times a day (TID) | ORAL | Status: DC
  Administered 2016-05-18 – 2016-05-20 (×5): 1 via ORAL
  Administered 2016-05-20: 237 mL via ORAL
  Administered 2016-05-21 (×3): 1 via ORAL

## 2016-05-18 MED ORDER — METHYLPREDNISOLONE SODIUM SUCC 125 MG IJ SOLR
80.0000 mg | Freq: Every day | INTRAMUSCULAR | Status: DC
  Administered 2016-05-19: 80 mg via INTRAVENOUS
  Filled 2016-05-18: qty 2

## 2016-05-18 MED ORDER — MORPHINE SULFATE (PF) 4 MG/ML IV SOLN
4.0000 mg | Freq: Once | INTRAVENOUS | Status: AC
  Administered 2016-05-18: 4 mg via INTRAVENOUS

## 2016-05-18 MED ORDER — HYDROMORPHONE HCL 1 MG/ML IJ SOLN: 1.0000 mg | INTRAMUSCULAR | Status: DC | PRN

## 2016-05-18 MED ORDER — POTASSIUM CHLORIDE CRYS ER 20 MEQ PO TBCR
40.0000 meq | EXTENDED_RELEASE_TABLET | Freq: Once | ORAL | Status: AC
  Administered 2016-05-18: 40 meq via ORAL
  Filled 2016-05-18: qty 2

## 2016-05-18 MED ORDER — METRONIDAZOLE 0.75 % EX GEL
1.0000 "application " | Freq: Two times a day (BID) | CUTANEOUS | Status: DC
  Administered 2016-05-18 – 2016-05-19 (×2): 1 via TOPICAL
  Filled 2016-05-18: qty 45

## 2016-05-18 MED ORDER — VANCOMYCIN HCL IN DEXTROSE 750-5 MG/150ML-% IV SOLN
750.0000 mg | INTRAVENOUS | Status: DC
Start: 2016-05-19 — End: 2016-05-18

## 2016-05-18 MED ORDER — ENOXAPARIN SODIUM 40 MG/0.4ML ~~LOC~~ SOLN
40.0000 mg | SUBCUTANEOUS | Status: DC
  Administered 2016-05-18: 40 mg via SUBCUTANEOUS
  Filled 2016-05-18: qty 0.4

## 2016-05-18 NOTE — ED Notes (Signed)
[facility] called @ [phone]

## 2016-05-18 NOTE — Progress Notes (Addendum)
Pharmacy Antibiotic Note  [name] is a 55 y.o. female with PMHx significant for levamisole/cocaine induced vasculitis and chronic leg wounds on doxycycline with recent admission 7/24-7/28 for vasculitis admitted on 05/18/2016 with increased pain in her bilateral posterior lower leg wounds with thick yellow discharge.   Pharmacy has been consulted for Vancomycin dosing.  Note pt ran out of doxycycline 3 days previously.    Plan: Vancomycin 76m IV x 1  Await BMET results for maintenance doses  Height: 5' 1.5" (156.2 cm) Weight: 99 lb (44.9 kg) IBW/kg (Calculated) : 48.95  Temp (24hrs), Avg:98.7 F (37.1 C), Min:98.7 F (37.1 C), Max:98.7 F (37.1 C)   Recent Labs Lab 05/18/16 1501 05/18/16 1502  WBC  --  13.6*  LATICACIDVEN 1.51  --     Estimated Creatinine Clearance: 38.1 mL/min (by C-G formula based on SCr of 1.21 mg/dL).    Allergies  Allergen Reactions  . Acetaminophen Swelling and Other (See Comments)    Reaction:  Eyelid swelling    Antimicrobials this admission: 8/14 Vancomycin >>   Dose adjustments this admission:  Microbiology results: 8/14 BCx: Collected 8/14 UCx: Collected   Thank you for allowing pharmacy to be a part of this patient's care.  CRalene Bathe PharmD, BCPS 05/18/2016, 3:40 PM  Pager: [phone] Addendum:   SCr = 0.93, CrCl ~50 ml/min (N=79) Continue Vancomycin IV [id number] (goal trough 10-15) F/u renal function, cultures, VT at Css   CoRalene BathePharmD, BCPS 05/18/2016, 4:33 PM  Pager: [phone]

## 2016-05-18 NOTE — ED Triage Notes (Signed)
BIB EMS from [place], c/o generalized weakness and "not feeling well". Pt has open wound to right calf.

## 2016-05-18 NOTE — H&P (Signed)
Date: 05/18/2016               Patient Name:  [name] MRN: [id number]  DOB: [birth year] Age / Sex: 55 y.o., female   PCP: Zada Finders, MD         Medical Service: Internal Medicine Teaching Service         Attending Physician: Dr. Bartholomew Crews, MD    First Contact: Pershing Cox, MS4 Pager: [phone]  Second Contact: Dr. Charlott Rakes, MD Pager: [phone]       After Hours (After 5p/  First Contact Pager: [phone]  weekends / holidays): Second Contact Pager: [phone]   Chief Complaint: increased pain of lower leg wounds.  History of Present Illness:  [name] is a 55 y/o F with MHx significant for levamisole/cocaine-induced vasculitis, cocaine abuse, chronic leg and hand wounds who presents for evaluation of increased pain and drainage of her diffuse wounds. She was recently admitted 7/24-7/28 for management of cocaine-induced vasculitis. She was discharged home on 7 days of doxycycline, stop date 8/4, and a month long Prednisone taper and reports completion of these medications. She was also admitted for the same complaint 6/28-7/1 and was discharged home on Keflex and steroid taper. She is followed by wound care as an outpatient but missed her most recent appointment.  She admits to 10/10 pain of her wounds, bleeding and yellow discharge from the areas. She denies any fever, chills, cough, shortness of breath, chest pain, abdominal pain, dysuria, nausea, vomiting, diarrhea or blood in stools.  Last cocaine use 2 days ago per patient.  In the ED, vital signs were stable and she was given Solumedrol 181m as well as IV Vancomycin 7563m   Meds:  Current Meds  Medication Sig  . bacitracin ointment Apply 1 application topically 2 (two) times daily.  . feeding supplement (BOOST / RESOURCE BREEZE) LIQD Take 1 Container by mouth 3 (three) times daily between meals.  . Marland Kitchenisinopril (PRINIVIL,ZESTRIL) 10 MG tablet Take 1 tablet (10 mg total) by mouth daily.  . metroNIDAZOLE  (METROGEL) 0.75 % gel Apply 1 application topically 2 (two) times daily.  Prednisone Taper, started on 05/01/2016  Allergies: Allergies as of 05/18/2016 - Review Complete 05/18/2016  Allergen Reaction Noted  . Acetaminophen Swelling and Other (See Comments)    Past Medical History:  Diagnosis Date  . CAP (community acquired pneumonia) 03/2014 X 2  . Cocaine abuse    ongoing with resultant vaculitis.  . Daily headache   . Depression   . Hypertension   . Inflammatory arthritis ([place]  . Migraines    "probably 5-6/yr" (04/21/2014)  . Normocytic anemia    BL Hgb 9.8-12. Last anemia panel 04/2010 - showing Fe 19, ferritin 101.  Pt on monthly B12 injections  . Rheumatoid arthritis(714.0)    patient reported  . 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 Vasculitis ([facility]   2/2 Levimasole toxicity. Followed by Dr. NiLouanne Skye Family History:  Mother: breast cancer, alcohol abuse Father: alcohol abuse  Social History:  Tobacco: 4-5 cigarettes/day x 35 years Alcohol: none Drug use: cocaine and crack cocaine, last use 2 days ago  Review of Systems: A complete ROS was negative except as per HPI.   Physical Exam: Blood pressure (!) 149/80, pulse 60, temperature 98.6 F (37 C), temperature source Oral, resp. rate 19, height 5' 1"  (1.549 m), weight 40.1 kg (88 lb 6.5 oz),  SpO2 100 %. General: Alert, chronically ill appearing female. In moderate distress. Patient crying during examination. HENT: Ears show evidence of prior wounds, most noted on BL helicies Cardiovascular: Regular rate and rhythm, without murmur.  Pulmonary: CTA BL, without wheezes or rales. Breathing unlabored.  Abdomen: Soft, non-distended, non-tender. +bowel sounds Extremities: Multiple ulcerated lesions present throughout including the Left 3rd digit, BL posterior leg, right MTP joint and left lateral ankle. Some serosanguinous fluid noted on wounds. Minor purulent drainage. Skin: diffuse,  well-healed lesions on extremities and trunk secondary to prior ulcerations.         Assessment & Plan by Problem: Active Problems:   Wound infection ([place])  1. Levamisole/Cocaine-induced vasculitis This is a 55 y/o F with long standing history of drug-induced vasculitis who presents with increased wound pain at multiple sites. Patient recently completed outpatient course of Doxycycline. She was prescribed a month taper of Prednisone and she reports completion of medication, however end date for said prescription is not for another 2 weeks.  Since admission, patient has remained afebrile, not tachycardic or tachypnic and has had 2 normal lactic acid levels.  Blood cultures ordered.  CBC and CMP reviewed which demonstrated a white count of 13.6, likely secondary to patients recent prednisone use. Potassium 3.4 which was replaced with 40 mEq K-dur.  Pt notes increased bleeding from wounds along with yellow discharge. She reports a 7 day course of Doxycycline, last dose 8/4, did not improve her wounds. Prior to this, patient was admitted 6/28-7/1 for same complaints, and was discharged home on Keflex and a steroid taper. In ED, she was given one dose of IV Vancomycin and Solumedrol 169m. As this is a Levamisole/cocaine-induced vasculitis, cessation of all cocaine products and administration of Prednisone is the preferred treatment. Solumedrol 821mordered Qdaily.  Antibiotics were not continued overnight as there was no significant purulent drainage and recently completed 2 courses of antibiotics without improvement. Pain controlled with Morphine 67m43m4H PRNl. Wound care consulted to assist in patients care.  2. Polysubstance abuse Patient admits to cocaine use, most recently 2 days ago, as well as tobacco abuse.  Good discussion was had with the patient regarding her living situation and need to stop using illicit drugs. She verbalized understanding and reports she's tried to quit but its  very difficult. She reports she has many friends but none that she trusts enough to help her through the process of stopping using cocaine.  She reports moving to avoid the negative influences she was surrounded by previously but reports drugs are still prevalent in her new area and has been having difficulty resisting.   3. Hypertension Home lisinopril 10 mg continued.  Diet: HH DVT Prophylaxis: Lovenox Consults: Wound care Code: full IVF: NSL  Dispo: Admit patient to Observation with expected length of stay less than 2 midnights.  Signed: BEinar GipO 05/18/2016, 7:56 PM  Pager: [phone]

## 2016-05-18 NOTE — ED Provider Notes (Signed)
[facility] DEPT Provider Note   CSN: [id number] Arrival date & time: 05/18/16  1356     History   Chief Complaint Chief Complaint  Patient presents with  . Weakness  . Wound Infection    HPI ROMONDA PARKER is a 55 y.o. female.  HPI   Pt with hx levamisole/cocaine induced vasculitis, cocaine abuse, chronic leg wounds with recent admission 7/24-7/28 for vasculitis of hand, lower legs, presents with increased pain in her bilateral posterior lower leg wounds, generalized weakness, feeling sick.  Used cocaine two days ago.  She missed her most recent wound care appointment and was on doxycycline but ran out 3 days ago.  Was also discharged on prednisone but is not currently taking.  Now has thick yellow discharge from the wounds.     Level V caveat for acuity of condition   Past Medical History:  Diagnosis Date  . CAP (community acquired pneumonia) 03/2014 X 2  . Cocaine abuse    ongoing with resultant vaculitis.  . Daily headache   . Depression   . Hypertension   . Inflammatory arthritis ([place])   . Migraines    "probably 5-6/yr" (04/21/2014)  . Normocytic anemia    BL Hgb 9.8-12. Last anemia panel 04/2010 - showing Fe 19, ferritin 101.  Pt on monthly B12 injections  . Rheumatoid arthritis(714.0)    patient reported  . 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 Vasculitis ([place])    2/2 Levimasole toxicity. Followed by Dr. Louanne Skye    Patient Active Problem List   Diagnosis Date Noted  . Wound infection ([place]) 05/18/2016  . AKI (acute kidney injury) ([place])   . Cellulitis 04/27/2016  . Acute renal failure (ARF) ([place]) 04/27/2016  . Healthcare maintenance 04/15/2016  . Leg ulcer ([place])   . Leg wound, left   . Skin ulcer ([place]) 06/04/2015  . Pressure ulcer 04/22/2015  . Skin necrosis 04/21/2015  . Protein-calorie malnutrition, severe ([place]) 01/15/2015  . Cocaine abuse   . Swelling of finger, right 01/14/2015  . Foot ulceration ([place]) 01/14/2015  .  Severe recurrent major depression without psychotic features ([place]) 10/16/2014  . Cocaine use disorder, severe, dependence ([place]) 10/10/2014  . Cannabis use disorder, severe, dependence ([place]) 10/10/2014  . Substance or medication-induced depressive disorder with onset during intoxication ([place])   . Substance induced mood disorder ([place]) 10/09/2014  . Major depressive disorder, single episode, severe without psychotic features ([place])   . 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 Vasculitis ([place]) 04/19/2014  . Hepatomegaly 04/11/2014  . Acute pericarditis 03/31/2014  . Major depression, recurrent ([place]) 01/13/2014  . Inflammatory arthritis ([place]) 09/24/2013  . 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 Cocaine dependence ([place]) 05/15/2013  . Tobacco abuse 01/03/2013  . hyperlipidemia 01/03/2013  . Essential hypertension 02/26/2010    Past Surgical History:  Procedure Laterality Date  . HERNIA REPAIR     "stomach"  . I&D EXTREMITY Right 09/26/2015   Procedure: IRRIGATION AND DEBRIDEMENT LEG WOUND  VAC PLACEMENT.;  Surgeon: Loel Lofty Dillingham, DO;  Location: [facility];  Service: Plastics;  Laterality: Right;  . IRRIGATION AND DEBRIDEMENT ABSCESS Bilateral 09/26/2013   Procedure: DEBRIDEMENT ULCERS BILATERAL THIGHS;  Surgeon: Gwenyth Ober, MD;  Location: [facility];  Service: General;  Laterality: Bilateral;  . SKIN BIOPSY Bilateral 04/2010   shin nodules    OB History    No data available       Home Medications    Prior  to Admission medications   Medication Sig Start Date End Date Taking? Authorizing Provider  bacitracin ointment Apply 1 application topically 2 (two) times daily.   Yes Historical Provider, MD  feeding supplement (BOOST / RESOURCE BREEZE) LIQD Take 1 Container by mouth 3 (three) times daily between meals. 05/01/16  Yes [name], MD  lisinopril (PRINIVIL,ZESTRIL) 10 MG tablet Take 1 tablet (10 mg total) by mouth daily. 04/15/16  Yes Zada Finders, MD  metroNIDAZOLE  (METROGEL) 0.75 % gel Apply 1 application topically 2 (two) times daily.   Yes Historical Provider, MD  oxyCODONE (OXY IR/ROXICODONE) 5 MG immediate release tablet Take 1 tablet (5 mg total) by mouth every 6 (six) hours as needed for severe pain. Patient not taking: Reported on 05/18/2016 05/01/16   [name], MD  predniSONE (DELTASONE) 20 MG tablet 2 tablet daily with breakfast for 1 week, then 1.5 tablet daily for 1 week, then 1 tablet daily for 1 week, then half tablet for 1 week. Patient not taking: Reported on 05/18/2016 05/01/16   [name], MD    Family History Family History  Problem Relation Age of Onset  . Breast cancer Mother     Breast cancer  . Alcohol abuse Mother   . Colon cancer Maternal Aunt 46  . Alcohol abuse Father     Social History Social History  Substance Use Topics  . Smoking status: Current Every Day Smoker    Packs/day: 0.30    Years: 34.00    Types: Cigarettes  . Smokeless tobacco: Never Used     Comment: "Trying very hard". / 4-5 CIGARETTES A DAY  . Alcohol use No     Allergies   Acetaminophen   Review of Systems Review of Systems  Unable to perform ROS: Acuity of condition     Physical Exam Updated Vital Signs BP (!) 197/101   Pulse 98   Temp 98.8 F (37.1 C) (Oral)   Resp 18   Ht 5' 1"  (1.549 m)   Wt 40.1 kg   SpO2 100%   BMI 16.70 kg/m   Physical Exam  Constitutional: No distress.  Cachectic   HENT:  Head: Normocephalic and atraumatic.  Neck: Neck supple.  Cardiovascular: Intact distal pulses.  Tachycardia present.   Pulmonary/Chest: Effort normal and breath sounds normal.  Neurological: She is alert.  Skin: She is not diaphoretic.  Deep chronic wounds on hands and lower legs.  Posterior right lower leg with deep chronic wounds with thick yellow discharge.    Psychiatric:  Pt begins screaming in pain with placement of IV, with removal of dressings.    Nursing note and vitals reviewed.            ED  Treatments / Results  Labs (all labs ordered are listed, but only abnormal results are displayed) Labs Reviewed  COMPREHENSIVE METABOLIC PANEL - Abnormal; Notable for the following:       Result Value   Potassium 3.4 (*)    Glucose, Bld 183 (*)    ALT 10 (*)    All other components within normal limits  CBC WITH DIFFERENTIAL/PLATELET - Abnormal; Notable for the following:    WBC 13.6 (*)    RDW 17.1 (*)    Neutro Abs 9.9 (*)    All other components within normal limits  URINALYSIS, ROUTINE W REFLEX MICROSCOPIC (NOT AT [facility]) - Abnormal; Notable for the following:    APPearance CLOUDY (*)    Glucose, UA 250 (*)    Hgb  urine dipstick SMALL (*)    Protein, ur 30 (*)    Leukocytes, UA SMALL (*)    All other components within normal limits  URINE RAPID DRUG SCREEN, HOSP PERFORMED - Abnormal; Notable for the following:    Opiates POSITIVE (*)    Cocaine POSITIVE (*)    All other components within normal limits  URINE MICROSCOPIC-ADD ON - Abnormal; Notable for the following:    Squamous Epithelial / LPF 6-30 (*)    Bacteria, UA MANY (*)    All other components within normal limits  I-STAT CG4 LACTIC ACID, ED - Abnormal; Notable for the following:    Lactic Acid, Venous 0.43 (*)    All other components within normal limits  CULTURE, BLOOD (ROUTINE X 2)  AEROBIC CULTURE (SUPERFICIAL SPECIMEN)  CULTURE, BLOOD (ROUTINE X 2)  URINE CULTURE  I-STAT BETA HCG BLOOD, ED ([facility], [facility], [facility] ONLY)  I-STAT CG4 LACTIC ACID, ED    EKG  EKG Interpretation None      Vitals:   05/18/16 1809 05/18/16 1940  BP: 154/99 (!) 197/101  Pulse: 104 98  Resp: 18 18  Temp:  98.8 F (37.1 C)    Radiology No results found.  Procedures Procedures (including critical care time)  Medications Ordered in ED Medications  metroNIDAZOLE (METROGEL) 8.83 % gel 1 application (not administered)  feeding supplement (BOOST / RESOURCE BREEZE) liquid 1 Container (not administered)  lisinopril (PRINIVIL,ZESTRIL)  tablet 10 mg (not administered)  enoxaparin (LOVENOX) injection 40 mg (not administered)  morphine 2 MG/ML injection 2 mg (not administered)  sodium chloride 0.9 % bolus 1,000 mL (1,000 mLs Intravenous New Bag/Given 05/18/16 1502)  morphine 4 MG/ML injection 4 mg (4 mg Intravenous Given 05/18/16 1502)  methylPREDNISolone sodium succinate (SOLU-MEDROL) 125 mg/2 mL injection 125 mg (125 mg Intravenous Given 05/18/16 1605)  vancomycin (VANCOCIN) IVPB 750 mg/150 ml premix (750 mg Intravenous Given 05/18/16 1605)  HYDROmorphone (DILAUDID) injection 1 mg (1 mg Intravenous Given 05/18/16 1605)  sodium chloride 0.9 % bolus 1,000 mL (0 mLs Intravenous Stopped 05/18/16 1720)     Initial Impression / Assessment and Plan / ED Course  I have reviewed the triage vital signs and the nursing notes.  Pertinent labs & imaging results that were available during my care of the patient were reviewed by me and considered in my medical decision making (see chart for details).  Clinical Course    Afebrile nontoxic patient with significant amount of pain from acute on chronic vasculitis due to cocaine abuse with new wound infections.  Pt noncompliant with regimen after recent discharge from hospital, not taking antbioitics or steroids, but is using cocaine.  Has thick yellow purulent discharge from the right posterior leg wound.  Pt screams with examination and with IV placement, not therefore able to get a thorough history.  She is a patient of [facility] and was recently admitted by them at [facility].  Pt accepted in transfer to [facility] for admission.   Final Clinical Impressions(s) / ED Diagnoses   Final diagnoses:  Wound infection ([place])  Vasculitis ([place])  Cocaine abuse    New Prescriptions Current Discharge Medication List       Clayton Bibles, [place] 05/18/16 1959    Fatima Blank, MD 05/20/16 [phone]

## 2016-05-19 ENCOUNTER — Encounter (HOSPITAL_COMMUNITY): Payer: Self-pay | Admitting: General Practice

## 2016-05-19 ENCOUNTER — Observation Stay (HOSPITAL_COMMUNITY): Payer: Medicaid Other

## 2016-05-19 DIAGNOSIS — S63253A Unspecified dislocation of left middle finger, initial encounter: Secondary | ICD-10-CM | POA: Diagnosis not present

## 2016-05-19 DIAGNOSIS — Z79899 Other long term (current) drug therapy: Secondary | ICD-10-CM | POA: Diagnosis not present

## 2016-05-19 DIAGNOSIS — I10 Essential (primary) hypertension: Secondary | ICD-10-CM

## 2016-05-19 DIAGNOSIS — F329 Major depressive disorder, single episode, unspecified: Secondary | ICD-10-CM | POA: Diagnosis present

## 2016-05-19 DIAGNOSIS — L958 Other vasculitis limited to the skin: Secondary | ICD-10-CM

## 2016-05-19 DIAGNOSIS — F14288 Cocaine dependence with other cocaine-induced disorder: Secondary | ICD-10-CM

## 2016-05-19 DIAGNOSIS — R64 Cachexia: Secondary | ICD-10-CM | POA: Diagnosis present

## 2016-05-19 DIAGNOSIS — Z886 Allergy status to analgesic agent status: Secondary | ICD-10-CM | POA: Diagnosis not present

## 2016-05-19 DIAGNOSIS — S61203A Unspecified open wound of left middle finger without damage to nail, initial encounter: Secondary | ICD-10-CM | POA: Diagnosis present

## 2016-05-19 DIAGNOSIS — S63273A Dislocation of unspecified interphalangeal joint of left middle finger, initial encounter: Secondary | ICD-10-CM | POA: Diagnosis present

## 2016-05-19 DIAGNOSIS — M318 Other specified necrotizing vasculopathies: Secondary | ICD-10-CM | POA: Diagnosis present

## 2016-05-19 DIAGNOSIS — T374X4A Poisoning by anthelminthics, undetermined, initial encounter: Secondary | ICD-10-CM | POA: Diagnosis present

## 2016-05-19 DIAGNOSIS — E876 Hypokalemia: Secondary | ICD-10-CM | POA: Diagnosis present

## 2016-05-19 DIAGNOSIS — M069 Rheumatoid arthritis, unspecified: Secondary | ICD-10-CM | POA: Diagnosis present

## 2016-05-19 DIAGNOSIS — Z681 Body mass index (BMI) 19 or less, adult: Secondary | ICD-10-CM | POA: Diagnosis not present

## 2016-05-19 DIAGNOSIS — S81801A Unspecified open wound, right lower leg, initial encounter: Secondary | ICD-10-CM | POA: Diagnosis present

## 2016-05-19 DIAGNOSIS — X58XXXA Exposure to other specified factors, initial encounter: Secondary | ICD-10-CM

## 2016-05-19 DIAGNOSIS — I776 Arteritis, unspecified: Secondary | ICD-10-CM | POA: Diagnosis present

## 2016-05-19 DIAGNOSIS — F1420 Cocaine dependence, uncomplicated: Secondary | ICD-10-CM | POA: Diagnosis present

## 2016-05-19 DIAGNOSIS — E43 Unspecified severe protein-calorie malnutrition: Secondary | ICD-10-CM | POA: Diagnosis present

## 2016-05-19 DIAGNOSIS — M064 Inflammatory polyarthropathy: Secondary | ICD-10-CM | POA: Diagnosis present

## 2016-05-19 DIAGNOSIS — F17210 Nicotine dependence, cigarettes, uncomplicated: Secondary | ICD-10-CM | POA: Diagnosis present

## 2016-05-19 DIAGNOSIS — Z9119 Patient's noncompliance with other medical treatment and regimen: Secondary | ICD-10-CM | POA: Diagnosis not present

## 2016-05-19 LAB — BASIC METABOLIC PANEL
ANION GAP: 7 (ref 5–15)
BUN: 12 mg/dL (ref 6–20)
CALCIUM: 8.8 mg/dL — AB (ref 8.9–10.3)
CO2: 22 mmol/L (ref 22–32)
CREATININE: 0.87 mg/dL (ref 0.44–1.00)
Chloride: 111 mmol/L (ref 101–111)
GFR calc Af Amer: >60 mL/min (ref >60)
GFR calc non Af Amer: >60 mL/min (ref >60)
GLUCOSE: 153 mg/dL — AB (ref 65–99)
Potassium: 3.8 mmol/L (ref 3.5–5.1)
Sodium: 140 mmol/L (ref 135–145)

## 2016-05-19 LAB — URINE CULTURE

## 2016-05-19 MED ORDER — PREDNISONE 20 MG PO TABS
40.0000 mg | ORAL_TABLET | Freq: Every day | ORAL | Status: DC
  Administered 2016-05-20 – 2016-05-23 (×3): 40 mg via ORAL
  Filled 2016-05-19 (×4): qty 2

## 2016-05-19 MED ORDER — METRONIDAZOLE 0.75 % EX GEL
1.0000 "application " | Freq: Every morning | CUTANEOUS | Status: DC
  Administered 2016-05-20 – 2016-05-23 (×3): 1 via TOPICAL
  Filled 2016-05-19: qty 45

## 2016-05-19 NOTE — Consult Note (Addendum)
WOC wound consult note This patient is well known to our department from her multiple previous admissions, most recent was in July. She presents with levamisole vasculitis with a history of multiple wounds.  Previously involved right finger and bilat ears have healed; as well as several wounds to the legs in the past.  She admits she is aware that these wounds are the result of a component in the drugs she continues to use. All are very painful to the touch, despite meds given prior to dressing changes. Reason for Consult: BLE ulcers Wound type:Levamisole vasculitis with multiple full thickness wounds. Measurement: Left Inner ankle: 4X3X.2cm, dark 80% red and moist, 20% yellow, mod amt tan drainage, no odor. basically unchanged since previous assessment. Right leg: 4X2X.1cm, 90% dark red, 10% slough, small amt tan drainage, no odor, area unchanged since previous admission Right inner foot 3.5X3.5X.2cm ; 80% dark red, 20% slough, small amt tan drainage, no odor. Area unchanged since previous admission. Right posterior leg  7X6.5X.2cm ; 80% dark red, 20% slough,mod amt tan drainage, no odor. Area unchanged since previous admission.  Left middle finger with large amt joint swelling and new full thickness wound; 2X.3X.3cm, located over the joint, dark red with small amt tan drainage, some odor. This area has significantly declined since previous admission.  Pt is unable to straighten the digit.  Recommend hand surgery consult for further plan of care.  Discussed plan of care with primary team.  Dressing procedure/placement/frequency: Patient has low tolerance for any wound dressing,  contact layer suggested related to pain: [facility] team has tried multiple products in the past including non-adherent gauze, silicone, petrolatum and oil emulsion.She removed the old dressing herself rather than have someone else perform that task. She is sensitive to saline cleanse and will tolerate only non-adherent dressings with  wound gel; this has already been ordered by the primary team. Pt prefers to perform her own dressing changes and has been doing this prior to admission. Discussed plan of care with patient and she verbalized understanding. Please re-consult if further assistance is needed. Thank-you,  Julien Girt MSN, [place], [place], [place], [place]

## 2016-05-19 NOTE — Progress Notes (Signed)
Subjective: [name] is tearful this morning. She expresses understanding that her ulcers are related to her cocaine use.  She states she would like to abstain from cocaine but has difficulty with relapse. She only snorts cocaine--does not inject any drugs. The lesions are painful, especially with movement. She denies fever, chills, shortness of breath, headaches, and chest pain.   Objective: Vital signs in last 24 hours: Vitals:   05/18/16 1809 05/18/16 1940 05/18/16 2047 05/19/16 0519  BP: 154/99 (!) 197/101 (!) 168/85 (!) 149/80  Pulse: 104 98 77 60  Resp: _0 Temp:  98.8 F (37.1 C) 99.3 F (37.4 C) 98.6 F (37 C)  TempSrc:  Oral Oral Oral  SpO2: 100% 100% 100% 100%  Weight:  40.1 kg (88 lb 6.5 oz)    Height:  _1  (1.549 m)     Weight change:  No intake or output data in the 24 hours ending 05/19/16 1128  Physical Exam: General: thin, African American female in NAD. Tearful when discussing cocaine use. HEENT: EOMI intact, hearing grossly intact.  Heart: RRR, no murmurs, rubs, or gallops. Lungs: CTA bilat. No wheezes, rales, or crackles. Extremities: L third finger swollen and erythematous, fixed flexed position, deep wound with bone visualization over PIP joint. Right leg: large circular ulcerative lesion (4x2x1cm) with small amount of tan drainage. Right inner foot: large ulcer (3.5x3.5x2cm) with tan drainage. Right posterior leg: large ulcer (7x6.5x2cm) with moderate amount of tan drainage. Left inner ankle: ulcer (4x3x2cm) with moderate amount of tan drainage.  Pulses 2+ DP. No edema.  Lab Results: Component     Latest Ref Rng & Units 05/19/2016  Sodium     135 - 145 mmol/L 140  Potassium     3.5 - 5.1 mmol/L 3.8  Chloride     101 - 111 mmol/L 111  CO2     22 - 32 mmol/L 22  Glucose     65 - 99 mg/dL 153 (H)  BUN     6 - 20 mg/dL 12  Creatinine     0.44 - 1.00 mg/dL 0.87  Calcium     8.9 - 10.3 mg/dL 8.8 (L)  EGFR (Non-African Amer.)     >60 mL/min  >60  EGFR (African American)     >60 mL/min >60  Anion gap     5 - 15 7   Micro Results: Recent Results (from the past 240 hour(s))  Culture, blood (Routine x 2)     Status: None (Preliminary result)   Collection Time: 05/18/16 10:52 AM  Result Value Ref Range Status   Specimen Description BLOOD RIGHT ARM  Final   Special Requests   Final    BOTTLES DRAWN AEROBIC AND ANAEROBIC 10CC Performed at [facility]    Culture PENDING  Incomplete   Report Status PENDING  Incomplete  Wound or Superficial Culture     Status: None (Preliminary result)   Collection Time: 05/18/16  4:06 PM  Result Value Ref Range Status   Specimen Description WOUND RIGHT LEG  Final   Special Requests NONE  Final   Gram Stain   Final    FEW WBC PRESENT, PREDOMINANTLY PMN FEW GRAM POSITIVE COCCI IN CLUSTERS RARE GRAM NEGATIVE RODS Performed at [place]  Final   Report Status PENDING  Incomplete   Studies/Results: CLINICAL DATA:  Abnormal third proximal interphalangeal joint. Question osteomyelitis.  EXAM: LEFT HAND - COMPLETE  3+ VIEW  COMPARISON:  April 27, 2016  FINDINGS: Chronic erosive changes of the fourth proximal interphalangeal joint are stable. The third proximal interphalangeal joint is dislocated. A well corticated lucency along the ulnar aspect of the middle phalanx is favored to represent a prominent nutrient foramen. No definitive fractures identified. However, a repeat film after relocation is recommended as evaluation for fracture is limited due to overlapping bones on this study. No other acute abnormalities are noted. No bony erosion to suggest osteomyelitis.  IMPRESSION: 1. The third finger is dislocated at the proximal interphalangeal joint. There is a prominent nutrient foramen but no definitive fracture. However, evaluation for subtle fracture is limited due to overlapping bones and a repeat film after relocation is  recommended. 2. Chronic erosive changes at the fourth proximal interphalangeal joint.  Medications:  Scheduled Meds: . enoxaparin (LOVENOX) injection  30 mg Subcutaneous Q24H  . feeding supplement  1 Container Oral TID BM  . lisinopril  10 mg Oral Daily  . methylPREDNISolone (SOLU-MEDROL) injection  80 mg Intravenous Daily  . 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 She recently completed outpatient course of doxycycline and prednisone taper with no improvement in wounds. Wounds do not appear infected and patient is hemodynamically stable. However, left third finger concerning for bone involvement. X-ray 8/15 shows dislocation at PIP joint and chronic bone erosion, likely 2/2 underlying autoimmune process with no apparent signs of osteomyelitis. Patient previously referred to Dr. Ouida Sills at [facility] but was not seen. WBC elevated at 13.6 but this is likely 2/2 recent steroids. Will hold off on starting antibiotics at this time. Solumedrol 80 mg qday was started on admission but we will switch to po prednisone to avoid immunosuppression.    --po prednisone 40 mg qday starting 8/16   --continue morphine 2 mg q4h prn. Pain currently controlled with few doses.   --wound care consulted, appreciate recommendations   --Ortho consulted for left hand   --blood cultures pending   --continue to reinforce abstinence for appropriate healing  Polysubstance abuse: long-history of cocaine and tobacco abuse. Denies IV drug use. HIV and Hep C negative 03/2016. [name] expresses understanding that cocaine is causing  her wounds. She has been homeless in the past but is now living at a local group home. She has tried stopping cocaine but has relapsed multiple times. Limited social support--she tried moving to a different area but drugs still prevalent in her new location.   Hypertension: Systolic 673'A-193'X, diastolic 90'W. Continued home lisinopril 10 mg. Continue to monitor.  Hypokalemia: 3.4 on admission. Repleted with 40 mEq K-dur and now 3.8.   Malnutrition: Boost tid  Diet: Heart healthy  DVT Prophylaxis: Lovenox  Code: FULL  Dispo: Admitted to Observation with expected length of stay less than 2 midnights.  This is a Careers information officer Note.  The care of the patient was discussed with Dr. Lynnae January and the assessment and plan formulated with their assistance.  Please see their attached note for official documentation of the daily encounter.   LOS: 0 days   [name], Medical Student 05/19/2016, 11:28 AM

## 2016-05-20 DIAGNOSIS — E43 Unspecified severe protein-calorie malnutrition: Secondary | ICD-10-CM

## 2016-05-20 MED ORDER — BACITRACIN-NEOMYCIN-POLYMYXIN OINTMENT TUBE
TOPICAL_OINTMENT | Freq: Every day | CUTANEOUS | Status: DC
  Administered 2016-05-21 – 2016-05-23 (×2): via TOPICAL
  Filled 2016-05-20 (×2): qty 15

## 2016-05-20 NOTE — Progress Notes (Signed)
   Subjective:  This morning, she is emotionally upset that her current situation. She is worried about her left middle finger. She also feels that her family is trying to misuse her money does not care for her. She knows that cocaine as a cause of her skin breakdown and is remorseful for having used it in the week leading up to her current admission. She otherwise does not report any complaints to me, like fever, chills, nausea, vomiting.   Objective:  Vital signs in last 24 hours: Vitals:   05/19/16 0519 05/19/16 1413 05/19/16 2207 05/20/16 0430  BP: (!) 149/80 (!) 141/76 130/64 (!) 153/67  Pulse: 60 84 71 (!) 55  Resp: 19 18 17 16   Temp: 98.6 F (37 C) 98.8 F (37.1 C) 98.1 F (36.7 C) 98.1 F (36.7 C)  TempSrc: Oral Oral Oral   SpO2: 100% 100% 100% 100%  Weight:      Height:       Physical Exam  Constitutional:  Upset and tearful though intermittently smiling  Cardiovascular: Normal rate and regular rhythm.   Pulmonary/Chest: Effort normal. No respiratory distress.  Abdominal: Soft. Bowel sounds are normal.  Skin: She is not diaphoretic.  Bilateral lower extremities wrapped with gauze and dressing. Left middle finger with what appears to be disarticulation of the PIP/DIP joint covered with foam bandage. Bone exposed.     Assessment/Plan: Principal Problem:   Cocaine-induced vascular disorder ([place]) Active Problems:   Essential hypertension   Cocaine dependence ([place])   Protein-calorie malnutrition, severe ([place])   Wound infection ([place])   Ms. Degraff is a 55 year old female with levamisole-induced vasculitis secondary to cocaine use, severe protein calorie malnutrition, hypertension hospitalized for worsening of her chronic wounds in the setting of recent cocaine use.  Levamisole-induced vasculitis: Secondary to chronic cocaine use. She has remained afebrile overnight which is reassuring for no acute infection. Left third finger appears worrisome though per conversations with  [name] [Orthopedics], surgical intervention is being deferred to allow for her to auto-amputate given the extent of her disarticulation. Very high risk of infection and poor chance of healing if bone remains exposed. -Continue prednisone 40 mg daily -Continue morphine 2 mg every 4 hours as needed -Start Neosporin ointment for the left third finger  -Continue metronidazole 0.75% gel for leg wounds -Follow blood cultures -Wound Care following, appreciate recommendations  Cocaine use: Encouraged her to continue thinking cessation. -Consult social work for resources if she expresses interest pending treatment of acute illness  Severe protein calorie malnutrition: Continue boost supplements 3 times daily.  HTN: BP trending 130-153/60s-80, likely elevated in the setting of pain and acute illness. Home medications include lisinopril 75m.  #FEN:  -Diet: Regular  #DVT prophylaxis: Lovenox  #CODE STATUS: FULL CODE  Dispo: Anticipated discharge in approximately 1-3 day(s).   RRiccardo Dubin MD 05/20/2016, 10:35 AM Pager: @[email or web address] @

## 2016-05-20 NOTE — Progress Notes (Signed)
Subjective: [name] is tearful this morning and regrets her recent cocaine use. States pain is controlled and she has a good appetite. No fever or chills. Spoke with Ortho who will not surgically manage treatment until patient ceases cocaine use.   Objective: Vital signs in last 24 hours: Vitals:   05/19/16 0519 05/19/16 1413 05/19/16 2207 05/20/16 0430  BP: (!) 149/80 (!) 141/76 130/64 (!) 153/67  Pulse: 60 84 71 (!) 55  Resp: 19 18 17 16   Temp: 98.6 F (37 C) 98.8 F (37.1 C) 98.1 F (36.7 C) 98.1 F (36.7 C)  TempSrc: Oral Oral Oral   SpO2: 100% 100% 100% 100%  Weight:      Height:       Weight change:   Intake/Output Summary (Last 24 hours) at 05/20/16 1035 Last data filed at 05/20/16 0436  Gross per 24 hour  Intake              300 ml  Output              600 ml  Net             -300 ml   Physical Exam: General: thin, African American female in NAD. Tearful when discussing cocaine use. HEENT: EOMI intact, hearing grossly intact.  Heart: RRR, no murmurs, rubs, or gallops. Lungs: CTA bilat. No wheezes, rales, or crackles. Extremities: L third finger swollen and erythematous, fixed flexed position, deep wound with bone visualization over PIP joint. Lower extremity wounds bandaged and dry. No edema noted.  Lab Results: No new results.  Micro Results: Recent Results (from the past 240 hour(s))  Culture, blood (Routine x 2)     Status: None (Preliminary result)   Collection Time: 05/18/16 10:52 AM  Result Value Ref Range Status   Specimen Description BLOOD RIGHT ARM  Final   Special Requests BOTTLES DRAWN AEROBIC AND ANAEROBIC 10CC  Final   Culture   Final    NO GROWTH < 24 HOURS Performed at [facility]    Report Status PENDING  Incomplete  Urine culture     Status: Abnormal   Collection Time: 05/18/16  2:20 PM  Result Value Ref Range Status   Specimen Description URINE, CLEAN CATCH  Final   Special Requests NONE  Final   Culture (A)  Final    4,000  COLONIES/mL INSIGNIFICANT GROWTH Performed at [facility]    Report Status 05/19/2016 FINAL  Final  Culture, blood (Routine x 2)     Status: None (Preliminary result)   Collection Time: 05/18/16  3:11 PM  Result Value Ref Range Status   Specimen Description BLOOD LEFT ARM  Final   Special Requests BOTTLES DRAWN AEROBIC AND ANAEROBIC 10CC  Final   Culture   Final    NO GROWTH < 24 HOURS Performed at [facility]    Report Status PENDING  Incomplete  Wound or Superficial Culture     Status: None (Preliminary result)   Collection Time: 05/18/16  4:06 PM  Result Value Ref Range Status   Specimen Description WOUND RIGHT LEG  Final   Special Requests NONE  Final   Gram Stain   Final    FEW WBC PRESENT, PREDOMINANTLY PMN FEW GRAM POSITIVE COCCI IN CLUSTERS RARE GRAM NEGATIVE RODS    Culture   Final    FEW GRAM NEGATIVE RODS CULTURE REINCUBATED FOR BETTER GROWTH Performed at [facility]    Report Status PENDING  Incomplete  Studies/Results: Dg Hand Complete Left  Result Date: 05/19/2016 CLINICAL DATA:  Abnormal third proximal interphalangeal joint. Question osteomyelitis. EXAM: LEFT HAND - COMPLETE 3+ VIEW COMPARISON:  April 27, 2016 FINDINGS: Chronic erosive changes of the fourth proximal interphalangeal joint are stable. The third proximal interphalangeal joint is dislocated. A well corticated lucency along the ulnar aspect of the middle phalanx is favored to represent a prominent nutrient foramen. No definitive fractures identified. However, a repeat film after relocation is recommended as evaluation for fracture is limited due to overlapping bones on this study. No other acute abnormalities are noted. No bony erosion to suggest osteomyelitis. IMPRESSION: 1. The third finger is dislocated at the proximal interphalangeal joint. There is a prominent nutrient foramen but no definitive fracture. However, evaluation for subtle fracture is limited due to overlapping  bones and a repeat film after relocation is recommended. 2. Chronic erosive changes at the fourth proximal interphalangeal joint. These results will be called to the ordering clinician or representative by the Radiologist Assistant, and communication documented in the PACS or zVision Dashboard. Electronically Signed   By: Dorise Bullion III M.D   On: 05/19/2016 11:29   Medications: Scheduled Meds: . enoxaparin (LOVENOX) injection  30 mg Subcutaneous Q24H  . feeding supplement  1 Container Oral TID BM  . lisinopril  10 mg Oral Daily  . metroNIDAZOLE  1 application Topical q morning - 10a  . predniSONE  40 mg Oral Q breakfast   Continuous Infusions:  PRN Meds:.morphine injection   Assessment/Plan: Principal Problem:   Cocaine-induced vascular disorder ([place]) Active Problems:   Essential hypertension   Cocaine dependence (HCC)   Protein-calorie malnutrition, severe (HCC)   Wound infection ([place])  Vasculitis, likely 2/2 levamisole/cocaine use: Long-standing history of vasculitis with most recent cocaine use 3-4 days ago per patient. She recently completed outpatient course of doxycycline and prednisone taper with no improvement in wounds. Wounds do not appear infected and patient is hemodynamically stable. However, left third finger now dislocated with X-ray 8/15 showing dislocation at PIP joint and chronic bone erosion, likely 2/2 underlying autoimmune process with no apparent signs of osteomyelitis. WBC elevated at 13.6 but this is likely 2/2 recent steroids. Spoke to Ortho on 8/16--they will not surgically amputate finger unless patient discontinues cocaine use. 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 Her condition will likely not improve unless cocaine is stopped.                                             --po prednisone 40 mg qday starting 8/16                                             --continue morphine 2 mg q4h prn. Pain currently controlled with  few doses.                                             --wound care consulted, appreciate recommendations. Will also add neosporin for finger coverage.                                             --  continue to hold off on abx as she does not show signs of infection                                             --blood cultures pending            --check cryoglobulinemia to r/o cryoglobulinemia-induced vasculitis. Recent hepatitis panel shows no evidence of hep C                                             --continue to reinforce abstinence for appropriate healing  Polysubstance abuse: long-history of cocaine and tobacco abuse. Denies IV drug use. HIV and Hep C negative 03/2016. Ms. Booher expresses understanding that cocaine is causing her wounds. She has been homeless in the past but is now living at a local group home. She has tried stopping cocaine but has relapsed multiple times. Limited social support--she tried moving to a different area but drugs still prevalent in her new location.   Hypertension: Recent BP 153/67. Continued home lisinopril 10 mg. Continue to monitor.  Hypokalemia: 3.4 on admission. Repleted with 40 mEq K-dur and now 3.8.   Malnutrition: Boost tid  Diet: Regular diet  DVT Prophylaxis: Lovenox  Code: FULL  Dispo: Admitted to Observation with expected length of stay less than 2 midnights.   This is a Careers information officer Note.  The care of the patient was discussed with Dr. Posey Pronto and the assessment and plan formulated with their assistance.  Please see their attached note for official documentation of the daily encounter.   LOS: 1 day   Pershing Cox, Medical Student 05/20/2016, 10:35 AM

## 2016-05-20 NOTE — Consult Note (Signed)
WOC follow-up:  Ortho team is not planning to follow patient for left finger wound, according to the EMR.  Primary team is aware of the X-ray results and has ordered neosporin for topical treatment.  Please re-consult if further assistance is needed.  Thank-you,  Julien Girt MSN, [place], [place], [place], [place]

## 2016-05-21 NOTE — Progress Notes (Signed)
Pt ambulating independently on unit with no concerns voiced. Dressing to lower extremities changed this pm as ordered

## 2016-05-21 NOTE — Progress Notes (Signed)
   Subjective: This morning, she appeared somewhat upset about her current state of health. We explained to her the importance of having the amputation done given the risk of infection associated with exposed bone at her finger joint. She did express some acknowledgment of her reality. She did not report any complaints today, like fever, chills, difficulty breathing, chest pain.  Objective:  Vital signs in last 24 hours: Vitals:   05/20/16 0430 05/20/16 1300 05/20/16 2034 05/21/16 0607  BP: (!) 153/67 (!) 155/82 132/65 134/67  Pulse: (!) 55 95 88 79  Resp: 16  16 16   Temp: 98.1 F (36.7 C) 98.8 F (37.1 C) 98.4 F (36.9 C) 97.7 F (36.5 C)  TempSrc:  Oral Oral   SpO2: 100% 99% 100% 100%  Weight:      Height:       Physical Exam  Constitutional:  Tearful  Cardiovascular: Normal rate and regular rhythm.   Pulmonary/Chest: Effort normal. No respiratory distress.  Abdominal: Soft. Bowel sounds are normal.  Skin: She is not diaphoretic.  Bilateral lower extremities wrapped with gauze and dressing. Left middle finger covered with dressing.     Assessment/Plan:  Principal Problem:   Cocaine-induced vascular disorder ([place]) Active Problems:   Essential hypertension   Cocaine dependence ([place])   Protein-calorie malnutrition, severe ([place])   Finger necrosis ([place])  Ms. Charon is a 55 -year-old female with levamisole-induced vasculitis secondary to cocaine use, severe protein calorie malnutrition, hypertension hospitalized for worsening of her vasculitic wounds in the setting of recent cocaine use and now left third digit necrosis.  Levamisole-induced vasculitis complicated by left third digit necrosis: Secondary to chronic cocaine use. She again remained afebrile overnight and hemodynamically stable. Per orthopedics, she is scheduled for finger amputation tomorrow. -Continue prednisone 40 mg daily -Continue morphine 2 mg every 4 hours as needed -Follow blood cultures -Wound Care  following, appreciate recommendations  Severe protein calorie malnutrition: Continue boost supplements 3 times daily with meals.  Hypertension: Blood pressures trending 130s-150s/60s-82. -Continue home lisinopril 10 mg daily  Dispo: Anticipated discharge in approximately 2-3 day(s).   Riccardo Dubin, MD 05/21/2016, 1:29 PM Pager: @[email or web address] @

## 2016-05-21 NOTE — Consult Note (Signed)
Reason for Consult:left long finger necrosis with open PIP joint  Referring Physician: Lynnae January MD  [name] is an 55 y.o. female.  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 I saw pt in July with same problem. Continued drug use with Levimisole vasculitis. Open left PIP joint now.   Past Medical History:  Diagnosis Date  . CAP (community acquired pneumonia) 03/2014 X 2  . Cocaine abuse    ongoing with resultant vaculitis.  . Daily headache   . Depression   . Hypertension   . Inflammatory arthritis ([place])   . Migraines    "probably 5-6/yr" (04/21/2014)  . Normocytic anemia    BL Hgb 9.8-12. Last anemia panel 04/2010 - showing Fe 19, ferritin 101.  Pt on monthly B12 injections  . Rheumatoid arthritis(714.0)    patient reported  . 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 Vasculitis ([place])    2/2 Levimasole toxicity. Followed by Dr. Louanne Skye    Past Surgical History:  Procedure Laterality Date  . HERNIA REPAIR     "stomach"  . I&D EXTREMITY Right 09/26/2015   Procedure: IRRIGATION AND DEBRIDEMENT LEG WOUND  VAC PLACEMENT.;  Surgeon: Loel Lofty Dillingham, DO;  Location: [facility];  Service: Plastics;  Laterality: Right;  . IRRIGATION AND DEBRIDEMENT ABSCESS Bilateral 09/26/2013   Procedure: DEBRIDEMENT ULCERS BILATERAL THIGHS;  Surgeon: Gwenyth Ober, MD;  Location: [facility];  Service: General;  Laterality: Bilateral;  . SKIN BIOPSY Bilateral 04/2010   shin nodules    Family History  Problem Relation Age of Onset  . Breast cancer Mother     Breast cancer  . Alcohol abuse Mother   . Colon cancer Maternal Aunt 46  . Alcohol abuse Father     Social History:  reports that she has been smoking Cigarettes.  She has a 10.20 pack-year smoking history. She has never used smokeless tobacco. She reports that she uses drugs, including "Crack" cocaine and Cocaine. She reports that she does not drink alcohol.  Allergies:  Allergies  Allergen Reactions  .  Acetaminophen Swelling and Other (See Comments)    Reaction:  Eyelid swelling    Medications: I have reviewed the patient's current medications.  No results found for this or any previous visit (from the past 48 hour(s)).  Dg Hand Complete Left  Result Date: 05/19/2016 CLINICAL DATA:  Abnormal third proximal interphalangeal joint. Question osteomyelitis. EXAM: LEFT HAND - COMPLETE 3+ VIEW COMPARISON:  April 27, 2016 FINDINGS: Chronic erosive changes of the fourth proximal interphalangeal joint are stable. The third proximal interphalangeal joint is dislocated. A well corticated lucency along the ulnar aspect of the middle phalanx is favored to represent a prominent nutrient foramen. No definitive fractures identified. However, a repeat film after relocation is recommended as evaluation for fracture is limited due to overlapping bones on this study. No other acute abnormalities are noted. No bony erosion to suggest osteomyelitis. IMPRESSION: 1. The third finger is dislocated at the proximal interphalangeal joint. There is a prominent nutrient foramen but no definitive fracture. However, evaluation for subtle fracture is limited due to overlapping bones and a repeat film after relocation is recommended. 2. Chronic erosive changes at the fourth proximal interphalangeal joint. These results will be called to the ordering clinician or representative by the Radiologist Assistant, and communication documented in the PACS or zVision Dashboard. Electronically Signed   By: Dorise Bullion III M.D   On: 05/19/2016 11:29  Review of Systems  Constitutional: Positive for malaise/fatigue and weight loss.  HENT: Negative for tinnitus.   Psychiatric/Behavioral: Positive for depression.       Chronic cocaine use   Blood pressure 134/67, pulse 79, temperature 97.7 F (36.5 C), resp. rate 16, height 5' 1"  (1.549 m), weight 40.1 kg (88 lb 6.5 oz), SpO2 100 %. Physical Exam  Constitutional:  40kg appears cachetic,  thin , malnurished  Eyes: Pupils are equal, round, and reactive to light.  Cardiovascular: Normal rate.   Respiratory: Effort normal.  GI: Soft.  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 Have posted her for finger amputation tomorrow AM Friday.  NPO after MN.                      [phone]  Aerion Bagdasarian C 05/21/2016, 7:20 AM

## 2016-05-21 NOTE — Progress Notes (Signed)
Subjective: [name] continues to be tearful this morning. She met with the orthopedic surgeon and is agreeable to the amputation. She states pain is controlled and denies fevers, chills.   Objective: Vital signs in last 24 hours: Vitals:   05/20/16 0430 05/20/16 1300 05/20/16 2034 05/21/16 0607  BP: (!) 153/67 (!) 155/82 132/65 134/67  Pulse: (!) 55 95 88 79  Resp: 16  16 16   Temp: 98.1 F (36.7 C) 98.8 F (37.1 C) 98.4 F (36.9 C) 97.7 F (36.5 C)  TempSrc:  Oral Oral   SpO2: 100% 99% 100% 100%  Weight:      Height:       Weight change:   Intake/Output Summary (Last 24 hours) at 05/21/16 1356 Last data filed at 05/21/16 1323  Gross per 24 hour  Intake              300 ml  Output             1000 ml  Net             -700 ml   Physical Exam: General: thin, African American female in NAD. Tearful. Heart: RRR, no murmurs, rubs, or gallops. Lungs: CTA bilat. No wheezes, rales, or crackles. Extremities: Wounds on L third finger and lower extremities bandaged. No edema.  Lab Results: No new results  Micro Results: Recent Results (from the past 240 hour(s))  Culture, blood (Routine x 2)     Status: None (Preliminary result)   Collection Time: 05/18/16 10:52 AM  Result Value Ref Range Status   Specimen Description BLOOD RIGHT ARM  Final   Special Requests BOTTLES DRAWN AEROBIC AND ANAEROBIC 10CC  Final   Culture   Final    NO GROWTH 3 DAYS Performed at [facility]    Report Status PENDING  Incomplete  Urine culture     Status: Abnormal   Collection Time: 05/18/16  2:20 PM  Result Value Ref Range Status   Specimen Description URINE, CLEAN CATCH  Final   Special Requests NONE  Final   Culture (A)  Final    4,000 COLONIES/mL INSIGNIFICANT GROWTH Performed at [facility]    Report Status 05/19/2016 FINAL  Final  Culture, blood (Routine x 2)     Status: None (Preliminary result)   Collection Time: 05/18/16  3:11 PM  Result Value Ref Range Status   Specimen Description BLOOD LEFT ARM  Final   Special Requests BOTTLES DRAWN AEROBIC AND ANAEROBIC 10CC  Final   Culture   Final    NO GROWTH 3 DAYS Performed at [facility]    Report Status PENDING  Incomplete  Wound or Superficial Culture     Status: None (Preliminary result)   Collection Time: 05/18/16  4:06 PM  Result Value Ref Range Status   Specimen Description WOUND RIGHT LEG  Final   Special Requests NONE  Final   Gram Stain   Final    FEW WBC PRESENT, PREDOMINANTLY PMN FEW GRAM POSITIVE COCCI IN CLUSTERS RARE GRAM NEGATIVE RODS Performed at [facility]  Final   Report Status PENDING  Incomplete   Studies/Results: No results found.  Medications:  Scheduled Meds: . enoxaparin (LOVENOX) injection  30 mg Subcutaneous Q24H  . feeding supplement  1 Container Oral TID BM  . lisinopril  10 mg Oral Daily  . metroNIDAZOLE  1 application Topical q morning - 10a  . neomycin-bacitracin-polymyxin  Topical Daily  . 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 Wounds do not appear infected and patient remains hemodynamically stable. Given dislocation and severity of wound on left third finger, Ortho consulted and will perform amputation 8/18.            --NPO at midnight            --recheck am BMP, CBC --po prednisone 40 mg qday started 8/16 --continue morphine 2 mg q4h prn. Pain currently controlled with few  doses. --wound care consulted, appreciate recommendations. Added neosporin for finger. --continue to hold off on abx as she does not show signs of infection --blood cultures pending                                             --Cryoglobulins pending --continue to reinforce abstinence for appropriate healing  Polysubstance abuse: long-history of cocaine and tobacco abuse. Denies IV drug use. HIV and Hep C negative 03/2016. Ms. Stokely expresses understanding that cocaine is causing her wounds. She has been homeless in the past but is now living at a local group home. She has tried stopping cocaine but has relapsed multiple times. Limited social support--she tried moving to a different area but drugs still prevalent in her new location.   Hypertension: Normotensive. Continued home lisinopril 10 mg.  Hypokalemia: 3.4 on admission. Repleted with 40 mEq K-dur to 3.8. Recheck BMP 8/18.  Malnutrition:Boost tid  Diet:Regular diet  DVT Prophylaxis:Lovenox  Code: FULL  Dispo: Admitted toObservation with expected length of stay less than 2 midnights.  This is a Careers information officer Note.  The care of the patient was discussed with Dr. Posey Pronto and the assessment and plan formulated with their assistance.  Please see their attached note for official documentation of the daily encounter.   LOS: 2 days   Pershing Cox, Medical Student 05/21/2016, 1:56 PM

## 2016-05-21 NOTE — Plan of Care (Signed)
Problem: Skin Integrity: Goal: Risk for impaired skin integrity will decrease Outcome: Not Progressing Skin integrity poor, multiple none healing wounds  Problem: Activity: Goal: Risk for activity intolerance will decrease Outcome: Progressing Pt mobilizing independently on unit

## 2016-05-21 NOTE — Progress Notes (Signed)
Pt signed consent for surgery tomorrow. Already on contact precautions for  MRSA done on 04/01/16, surgical pcr not done

## 2016-05-21 NOTE — Progress Notes (Signed)
Medicine attending: I examined this patient with the medical team this morning including Dr. Charlott Rakes and medical student Pershing Cox. We greatly appreciate orthopedic evaluation. Plan is to proceed with amputation of the left third digit tomorrow. Finger wound with protruding bone now addressed. She remains afebrile. Not currently on parenteral antibiotics. She is receiving topical metronidazole dressings to the ulcers on her lower extremities. Most recent CBC on August 14 with total white count 13,600, 73% neutrophils. Blood pressure is normal. Oxygen saturation 100% on room air. She is medically stable to proceed with surgery tomorrow.

## 2016-05-22 ENCOUNTER — Inpatient Hospital Stay (HOSPITAL_COMMUNITY): Payer: Medicaid Other | Admitting: Anesthesiology

## 2016-05-22 ENCOUNTER — Encounter (HOSPITAL_COMMUNITY)
Admission: EM | Disposition: A | Discharge status: Home / Self Care | Payer: Self-pay | Source: Home / Self Care | Attending: Internal Medicine

## 2016-05-22 HISTORY — PX: AMPUTATION: SHX166

## 2016-05-22 LAB — BASIC METABOLIC PANEL
Anion gap: 8 (ref 5–15)
BUN: 20 mg/dL (ref 6–20)
CHLORIDE: 107 mmol/L (ref 101–111)
CO2: 25 mmol/L (ref 22–32)
CREATININE: 0.84 mg/dL (ref 0.44–1.00)
Calcium: 8.8 mg/dL — ABNORMAL LOW (ref 8.9–10.3)
GFR calc non Af Amer: >60 mL/min (ref >60)
Glucose, Bld: 87 mg/dL (ref 65–99)
POTASSIUM: 3.7 mmol/L (ref 3.5–5.1)
Sodium: 140 mmol/L (ref 135–145)

## 2016-05-22 LAB — CBC
HEMATOCRIT: 32.7 % — AB (ref 36.0–46.0)
Hemoglobin: 10.4 g/dL — ABNORMAL LOW (ref 12.0–15.0)
MCH: 28.3 pg (ref 26.0–34.0)
MCHC: 31.8 g/dL (ref 30.0–36.0)
MCV: 88.9 fL (ref 78.0–100.0)
PLATELETS: 246 10*3/uL (ref 150–400)
RBC: 3.68 MIL/uL — AB (ref 3.87–5.11)
RDW: 16.6 % — ABNORMAL HIGH (ref 11.5–15.5)
WBC: 10.7 10*3/uL — ABNORMAL HIGH (ref 4.0–10.5)

## 2016-05-22 LAB — SURGICAL PCR SCREEN
MRSA, PCR: NEGATIVE
Staphylococcus aureus: NEGATIVE

## 2016-05-22 SURGERY — AMPUTATION DIGIT
Anesthesia: General | Site: Finger | Laterality: Left

## 2016-05-22 MED ORDER — ONDANSETRON HCL 4 MG/2ML IJ SOLN: 4.0000 mg | Freq: Four times a day (QID) | INTRAMUSCULAR | Status: DC | PRN

## 2016-05-22 MED ORDER — FENTANYL CITRATE (PF) 100 MCG/2ML IJ SOLN
INTRAMUSCULAR | Status: AC
  Filled 2016-05-22: qty 4

## 2016-05-22 MED ORDER — MIDAZOLAM HCL 2 MG/2ML IJ SOLN
INTRAMUSCULAR | Status: AC
  Filled 2016-05-22: qty 2

## 2016-05-22 MED ORDER — DIPHENHYDRAMINE HCL 50 MG/ML IJ SOLN
INTRAMUSCULAR | Status: AC
  Filled 2016-05-22: qty 1

## 2016-05-22 MED ORDER — LIDOCAINE 2% (20 MG/ML) 5 ML SYRINGE
INTRAMUSCULAR | Status: AC
  Filled 2016-05-22: qty 10

## 2016-05-22 MED ORDER — METOCLOPRAMIDE HCL 5 MG/ML IJ SOLN: 5.0000 mg | Freq: Three times a day (TID) | INTRAMUSCULAR | Status: DC | PRN

## 2016-05-22 MED ORDER — ONDANSETRON HCL 4 MG PO TABS: 4.0000 mg | ORAL_TABLET | Freq: Four times a day (QID) | ORAL | Status: DC | PRN

## 2016-05-22 MED ORDER — ONDANSETRON HCL 4 MG/2ML IJ SOLN
INTRAMUSCULAR | Status: AC
  Filled 2016-05-22: qty 2

## 2016-05-22 MED ORDER — PHENYLEPHRINE 40 MCG/ML (10ML) SYRINGE FOR IV PUSH (FOR BLOOD PRESSURE SUPPORT)
PREFILLED_SYRINGE | INTRAVENOUS | Status: AC
  Filled 2016-05-22: qty 10

## 2016-05-22 MED ORDER — SODIUM CHLORIDE 0.9 % IJ SOLN
INTRAMUSCULAR | Status: AC
  Filled 2016-05-22: qty 20

## 2016-05-22 MED ORDER — BUPIVACAINE HCL 0.5 % IJ SOLN
INTRAMUSCULAR | Status: AC
  Filled 2016-05-22: qty 1

## 2016-05-22 MED ORDER — LACTATED RINGERS IV SOLN
INTRAVENOUS | Status: DC
  Administered 2016-05-22: 10:00:00 via INTRAVENOUS

## 2016-05-22 MED ORDER — METOCLOPRAMIDE HCL 5 MG PO TABS: 5.0000 mg | ORAL_TABLET | Freq: Three times a day (TID) | ORAL | Status: DC | PRN

## 2016-05-22 MED ORDER — HYDROMORPHONE HCL 1 MG/ML IJ SOLN: 0.2500 mg | INTRAMUSCULAR | Status: DC | PRN

## 2016-05-22 MED ORDER — BUPIVACAINE HCL 0.5 % IJ SOLN
INTRAMUSCULAR | Status: DC | PRN
  Administered 2016-05-22: 50 mL

## 2016-05-22 MED ORDER — CEFAZOLIN IN D5W 1 GM/50ML IV SOLN
1.0000 g | Freq: Once | INTRAVENOUS | Status: AC
  Administered 2016-05-22: 1 g via INTRAVENOUS

## 2016-05-22 MED ORDER — DEXAMETHASONE SODIUM PHOSPHATE 10 MG/ML IJ SOLN
INTRAMUSCULAR | Status: AC
  Filled 2016-05-22: qty 1

## 2016-05-22 MED ORDER — FENTANYL CITRATE (PF) 250 MCG/5ML IJ SOLN
INTRAMUSCULAR | Status: DC | PRN
  Administered 2016-05-22 (×4): 50 ug via INTRAVENOUS

## 2016-05-22 MED ORDER — ONDANSETRON HCL 4 MG/2ML IJ SOLN
INTRAMUSCULAR | Status: DC | PRN
  Administered 2016-05-22: 4 mg via INTRAVENOUS

## 2016-05-22 MED ORDER — OXYCODONE HCL 5 MG/5ML PO SOLN: 5.0000 mg | Freq: Once | ORAL | Status: DC | PRN

## 2016-05-22 MED ORDER — SUCCINYLCHOLINE CHLORIDE 200 MG/10ML IV SOSY
PREFILLED_SYRINGE | INTRAVENOUS | Status: AC
  Filled 2016-05-22: qty 20

## 2016-05-22 MED ORDER — ROCURONIUM BROMIDE 10 MG/ML (PF) SYRINGE
PREFILLED_SYRINGE | INTRAVENOUS | Status: AC
  Filled 2016-05-22: qty 10

## 2016-05-22 MED ORDER — PROPOFOL 10 MG/ML IV BOLUS
INTRAVENOUS | Status: DC | PRN
  Administered 2016-05-22: 140 mg via INTRAVENOUS

## 2016-05-22 MED ORDER — 0.9 % SODIUM CHLORIDE (POUR BTL) OPTIME
TOPICAL | Status: DC | PRN
  Administered 2016-05-22: 1000 mL

## 2016-05-22 MED ORDER — CEFAZOLIN SODIUM 1 G IJ SOLR
INTRAMUSCULAR | Status: AC
  Filled 2016-05-22: qty 10

## 2016-05-22 MED ORDER — PROPOFOL 10 MG/ML IV BOLUS
INTRAVENOUS | Status: AC
  Filled 2016-05-22: qty 20

## 2016-05-22 MED ORDER — OXYCODONE HCL 5 MG PO TABS: 5.0000 mg | ORAL_TABLET | Freq: Once | ORAL | Status: DC | PRN

## 2016-05-22 MED ORDER — MIDAZOLAM HCL 5 MG/5ML IJ SOLN
INTRAMUSCULAR | Status: DC | PRN
  Administered 2016-05-22: 2 mg via INTRAVENOUS

## 2016-05-22 MED ORDER — LIDOCAINE HCL (CARDIAC) 20 MG/ML IV SOLN
INTRAVENOUS | Status: DC | PRN
  Administered 2016-05-22: 50 mg via INTRATRACHEAL

## 2016-05-22 MED ORDER — EPHEDRINE SULFATE 50 MG/ML IJ SOLN
INTRAMUSCULAR | Status: AC
  Filled 2016-05-22: qty 1

## 2016-05-22 MED ORDER — OXYCODONE HCL 5 MG PO TABS
5.0000 mg | ORAL_TABLET | Freq: Four times a day (QID) | ORAL | Status: DC | PRN
  Administered 2016-05-22: 5 mg via ORAL
  Filled 2016-05-22 (×2): qty 1

## 2016-05-22 SURGICAL SUPPLY — 55 items
APL SKNCLS STERI-STRIP NONHPOA (GAUZE/BANDAGES/DRESSINGS) ×1
BANDAGE COBAN STERILE 2 (GAUZE/BANDAGES/DRESSINGS) ×2 IMPLANT
BANDAGE ELASTIC 4 VELCRO ST LF (GAUZE/BANDAGES/DRESSINGS) IMPLANT
BANDAGE GAUZE 4  KLING STR (GAUZE/BANDAGES/DRESSINGS) ×2 IMPLANT
BENZOIN TINCTURE PRP APPL 2/3 (GAUZE/BANDAGES/DRESSINGS) ×3 IMPLANT
BLADE SURG ROTATE 9660 (MISCELLANEOUS) IMPLANT
BNDG COHESIVE 1X5 TAN STRL LF (GAUZE/BANDAGES/DRESSINGS) IMPLANT
BNDG CONFORM 2 STRL LF (GAUZE/BANDAGES/DRESSINGS) IMPLANT
BNDG CONFORM 3 STRL LF (GAUZE/BANDAGES/DRESSINGS) IMPLANT
BNDG ELASTIC 2X5.8 VLCR STR LF (GAUZE/BANDAGES/DRESSINGS) IMPLANT
CLOSURE WOUND 1/2 X4 (GAUZE/BANDAGES/DRESSINGS) ×1
CORDS BIPOLAR (ELECTRODE) ×3 IMPLANT
COVER SURGICAL LIGHT HANDLE (MISCELLANEOUS) ×3 IMPLANT
CUFF TOURNIQUET SINGLE 18IN (TOURNIQUET CUFF) IMPLANT
CUFF TOURNIQUET SINGLE 24IN (TOURNIQUET CUFF) IMPLANT
DRSG EMULSION OIL 3X3 NADH (GAUZE/BANDAGES/DRESSINGS) IMPLANT
DURAPREP 26ML APPLICATOR (WOUND CARE) ×3 IMPLANT
ELECT REM PT RETURN 9FT ADLT (ELECTROSURGICAL)
ELECTRODE REM PT RTRN 9FT ADLT (ELECTROSURGICAL) IMPLANT
GAUZE SPONGE 2X2 8PLY STRL LF (GAUZE/BANDAGES/DRESSINGS) IMPLANT
GAUZE SPONGE 4X4 12PLY STRL (GAUZE/BANDAGES/DRESSINGS) IMPLANT
GAUZE XEROFORM 1X8 LF (GAUZE/BANDAGES/DRESSINGS) ×2 IMPLANT
GLOVE BIOGEL PI IND STRL 8 (GLOVE) ×1 IMPLANT
GLOVE BIOGEL PI INDICATOR 8 (GLOVE) ×2
GLOVE ORTHO TXT STRL SZ7.5 (GLOVE) ×3 IMPLANT
GOWN STRL REUS W/ TWL LRG LVL3 (GOWN DISPOSABLE) ×2 IMPLANT
GOWN STRL REUS W/TWL 2XL LVL3 (GOWN DISPOSABLE) IMPLANT
GOWN STRL REUS W/TWL LRG LVL3 (GOWN DISPOSABLE) ×6
KIT BASIN OR (CUSTOM PROCEDURE TRAY) ×3 IMPLANT
KIT ROOM TURNOVER OR (KITS) ×3 IMPLANT
MANIFOLD NEPTUNE II (INSTRUMENTS) ×3 IMPLANT
NS IRRIG 1000ML POUR BTL (IV SOLUTION) ×3 IMPLANT
PACK ORTHO EXTREMITY (CUSTOM PROCEDURE TRAY) ×3 IMPLANT
PAD ARMBOARD 7.5X6 YLW CONV (MISCELLANEOUS) ×6 IMPLANT
SPECIMEN JAR SMALL (MISCELLANEOUS) ×3 IMPLANT
SPONGE GAUZE 2X2 STER 10/PKG (GAUZE/BANDAGES/DRESSINGS)
SPONGE GAUZE 4X4 12PLY STER LF (GAUZE/BANDAGES/DRESSINGS) ×2 IMPLANT
STRIP CLOSURE SKIN 1/2X4 (GAUZE/BANDAGES/DRESSINGS) ×2 IMPLANT
SUCTION FRAZIER HANDLE 10FR (MISCELLANEOUS)
SUCTION TUBE FRAZIER 10FR DISP (MISCELLANEOUS) IMPLANT
SUT ETHIBOND 4 0 TF (SUTURE) IMPLANT
SUT ETHIBOND 5 0 P 3 (SUTURE)
SUT ETHILON 4 0 P 3 18 (SUTURE) IMPLANT
SUT ETHILON 4 0 PS 2 18 (SUTURE) ×2 IMPLANT
SUT ETHILON 5 0 P 3 18 (SUTURE) ×2
SUT MERSILENE 6 0 S14 DA (SUTURE) IMPLANT
SUT NYLON ETHILON 5-0 P-3 1X18 (SUTURE) ×1 IMPLANT
SUT POLY ETHIBOND 5-0 P-3 1X18 (SUTURE) IMPLANT
SUT PROLENE 4 0 P 3 18 (SUTURE) IMPLANT
SUT SILK 4 0 PS 2 (SUTURE) IMPLANT
TOWEL OR 17X24 6PK STRL BLUE (TOWEL DISPOSABLE) ×3 IMPLANT
TOWEL OR 17X26 10 PK STRL BLUE (TOWEL DISPOSABLE) ×3 IMPLANT
TUBE CONNECTING 12'X1/4 (SUCTIONS)
TUBE CONNECTING 12X1/4 (SUCTIONS) IMPLANT
WATER STERILE IRR 1000ML POUR (IV SOLUTION) ×3 IMPLANT

## 2016-05-22 NOTE — Progress Notes (Signed)
Subjective: Ms. Villeda slept well overnight. She is hungry and anticipating surgery. She notes some stinging over her right lower extremity wounds that she contributes to the metronidazole gel. She is in good spirits.  Objective: Vital signs in last 24 hours: Vitals:   05/21/16 0607 05/21/16 1444 05/21/16 2146 05/22/16 0529  BP: 134/67 (!) 132/53 136/75 (!) 163/74  Pulse: 79 87 88 69  Resp: _0 Temp: 97.7 F (36.5 C) 98.6 F (37 C) 98.8 F (37.1 C) 97.9 F (36.6 C)  TempSrc:  Oral Oral Oral  SpO2: 100% 100% 100% 100%  Weight:      Height:       Weight change:   Intake/Output Summary (Last 24 hours) at 05/22/16 0651 Last data filed at 05/21/16 2200  Gross per 24 hour  Intake              620 ml  Output              300 ml  Net              320 ml   Physical Exam: General: thin, African American female in NAD.  Heart: RRR, no murmurs, rubs, or gallops. Lungs: CTA bilat. No wheezes, rales, or crackles. Extremities: Wounds on L third finger and lower extremities bandaged. Some serosanguinous drainage from right lower extremity wounds. No edema.  Lab Results: Component     Latest Ref Rng & Units 05/22/2016  Sodium     135 - 145 mmol/L 140  Potassium     3.5 - 5.1 mmol/L 3.7  Chloride     101 - 111 mmol/L 107  CO2     22 - 32 mmol/L 25  Glucose     65 - 99 mg/dL 87  BUN     6 - 20 mg/dL 20  Creatinine     0.44 - 1.00 mg/dL 0.84  Calcium     8.9 - 10.3 mg/dL 8.8 (L)  EGFR (Non-African Amer.)     >60 mL/min >60  EGFR (African American)     >60 mL/min >60  Anion gap     5 - 15 8  WBC     4.0 - 10.5 K/uL 10.7 (H)  RBC     3.87 - 5.11 MIL/uL 3.68 (L)  Hemoglobin     12.0 - 15.0 g/dL 10.4 (L)  HCT     36.0 - 46.0 % 32.7 (L)  MCV     78.0 - 100.0 fL 88.9  MCH     26.0 - 34.0 pg 28.3  MCHC     30.0 - 36.0 g/dL 31.8  RDW     11.5 - 15.5 % 16.6 (H)  Platelets     150 - 400 K/uL 246    Micro Results: Recent Results (from the past 240 hour(s))    Culture, blood (Routine x 2)     Status: None (Preliminary result)   Collection Time: 05/18/16 10:52 AM  Result Value Ref Range Status   Specimen Description BLOOD RIGHT ARM  Final   Special Requests BOTTLES DRAWN AEROBIC AND ANAEROBIC 10CC  Final   Culture   Final    NO GROWTH 3 DAYS Performed at [facility]    Report Status PENDING  Incomplete  Urine culture     Status: Abnormal   Collection Time: 05/18/16  2:20 PM  Result Value Ref Range Status   Specimen Description URINE, CLEAN CATCH  Final  Special Requests NONE  Final   Culture (A)  Final    4,000 COLONIES/mL INSIGNIFICANT GROWTH Performed at [facility]    Report Status 05/19/2016 FINAL  Final  Culture, blood (Routine x 2)     Status: None (Preliminary result)   Collection Time: 05/18/16  3:11 PM  Result Value Ref Range Status   Specimen Description BLOOD LEFT ARM  Final   Special Requests BOTTLES DRAWN AEROBIC AND ANAEROBIC 10CC  Final   Culture   Final    NO GROWTH 3 DAYS Performed at [facility]    Report Status PENDING  Incomplete  Wound or Superficial Culture     Status: None (Preliminary result)   Collection Time: 05/18/16  4:06 PM  Result Value Ref Range Status   Specimen Description WOUND RIGHT LEG  Final   Special Requests NONE  Final   Gram Stain   Final    FEW WBC PRESENT, PREDOMINANTLY PMN FEW GRAM POSITIVE COCCI IN CLUSTERS RARE GRAM NEGATIVE RODS Performed at [facility]  Final   Report Status PENDING  Incomplete   Studies/Results: No results found.   Medications: Scheduled Meds: . enoxaparin (LOVENOX) injection  30 mg Subcutaneous Q24H  . feeding supplement  1 Container Oral TID BM  . lisinopril  10 mg Oral Daily  . metroNIDAZOLE  1 application Topical q morning - 10a  . neomycin-bacitracin-polymyxin   Topical Daily  . 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 Wounds do not appear infected, blood cultures NGTD, and patient remains hemodynamically stable. Given dislocation and severity of wound on left third finger, Ortho consulted and will perform amputation today (8/18).                                              --recheck BMP, CBC post-op --po prednisone 40 mg qday started 8/16 --continue morphine 2 mg q4h prn.  --wound care consulted, appreciate recommendations. --continue to hold off on abx as she does not show signs of infection  --Cryoglobulins pending --continue to reinforce abstinence for appropriate healing  Polysubstance abuse: long-history of cocaine and tobacco abuse. Denies IV drug use. HIV and Hep C negative 03/2016. Ms. Gaugh expresses understanding that cocaine is causing her wounds. She has been homeless in the past but is now living at a local group home. She has tried stopping cocaine but has relapsed multiple times. Limited social support--she tried moving to a different area but drugs still prevalent in her new location.   Hypertension: Hypertensive to 163/74 this morning. Continue home lisinopril and evaluate post-op.   Hypokalemia: 3.4 on admission. Repleted with 40 mEq K-dur to 3.8. Stable at 3.7 on 8/18.  Malnutrition:Boost tid  Diet:NPO, ADAT after surgery  DVT  Prophylaxis:Lovenox  Code: FULL  Dispo: Admitted to Inpatient. Anticipate stay of 2-3 days.   This is a Careers information officer Note.  The care of the patient was discussed with Dr. Posey Pronto and the assessment and  plan formulated with their assistance.  Please see their attached note for official documentation of the daily encounter.   LOS: 3 days   Pershing Cox, Medical Student 05/22/2016, 6:51 AM

## 2016-05-22 NOTE — Brief Op Note (Signed)
05/18/2016 - 05/22/2016  11:50 AM  PATIENT:  [name]  55 y.o. female  PRE-OPERATIVE DIAGNOSIS:  wound infection  POST-OPERATIVE DIAGNOSIS:  wound infection  PROCEDURE:  Procedure(s): AMPUTATION LEFT LONG FINGER (Left)  SURGEON:  Surgeon(s) and Role:    * Marybelle Killings, MD - Primary  PHYSICIAN ASSISTANT: Benjiman Core    ANESTHESIA:   general  EBL:  Total I/O In: 750 [I.V.:750] Out: 20 [Blood:20]  BLOOD ADMINISTERED:none  DRAINS: none   LOCAL MEDICATIONS USED:  NONE  SPECIMEN:  No Specimen  DISPOSITION OF SPECIMEN:  N/A  COUNTS:  YES  TOURNIQUET:   Total Tourniquet Time Documented: Upper Arm (Left) - 5 minutes Total: Upper Arm (Left) - 5 minutes   DICTATION: .Dragon Dictation   PATIENT DISPOSITION:  PACU - hemodynamically stable.

## 2016-05-22 NOTE — Anesthesia Preprocedure Evaluation (Signed)
Anesthesia Evaluation  Patient identified by MRN, date of birth, ID band Patient awake    Reviewed: Allergy & Precautions, NPO status , Patient's Chart, lab work & pertinent test results  Airway Mallampati: II   Neck ROM: full    Dental   Pulmonary Current Smoker,    breath sounds clear to auscultation       Cardiovascular hypertension,  Rhythm:regular Rate:Normal     Neuro/Psych  Headaches, PSYCHIATRIC DISORDERS Depression    GI/Hepatic (+)     substance abuse  cocaine use,   Endo/Other    Renal/GU      Musculoskeletal  (+) Arthritis ,   Abdominal   Peds  Hematology  (+) anemia ,   Anesthesia Other Findings   Reproductive/Obstetrics                             Anesthesia Physical Anesthesia Plan  ASA: II  Anesthesia Plan: General   Post-op Pain Management:    Induction: Intravenous  Airway Management Planned: LMA  Additional Equipment:   Intra-op Plan:   Post-operative Plan:   Informed Consent: I have reviewed the patients History and Physical, chart, labs and discussed the procedure including the risks, benefits and alternatives for the proposed anesthesia with the patient or authorized representative who has indicated his/her understanding and acceptance.     Plan Discussed with: CRNA, Anesthesiologist and Surgeon  Anesthesia Plan Comments:         Anesthesia Quick Evaluation

## 2016-05-22 NOTE — Anesthesia Procedure Notes (Signed)
Procedure Name: LMA Insertion Date/Time: 05/22/2016 11:09 AM Performed by: Mariea Clonts Pre-anesthesia Checklist: Patient identified, Emergency Drugs available, Suction available, Patient being monitored and Timeout performed Patient Re-evaluated:Patient Re-evaluated prior to inductionOxygen Delivery Method: Circle system utilized Preoxygenation: Pre-oxygenation with 100% oxygen Intubation Type: IV induction LMA: LMA inserted LMA Size: 3.0 Placement Confirmation: positive ETCO2 and breath sounds checked- equal and bilateral Tube secured with: Tape Dental Injury: Teeth and Oropharynx as per pre-operative assessment

## 2016-05-22 NOTE — H&P (View-Only) (Signed)
Reason for Consult:left long finger necrosis with open PIP joint  Referring Physician: Lynnae January MD  [name] is an 55 y.o. female.  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 I saw pt in July with same problem. Continued drug use with Levimisole vasculitis. Open left PIP joint now.   Past Medical History:  Diagnosis Date  . CAP (community acquired pneumonia) 03/2014 X 2  . Cocaine abuse    ongoing with resultant vaculitis.  . Daily headache   . Depression   . Hypertension   . Inflammatory arthritis ([place])   . Migraines    "probably 5-6/yr" (04/21/2014)  . Normocytic anemia    BL Hgb 9.8-12. Last anemia panel 04/2010 - showing Fe 19, ferritin 101.  Pt on monthly B12 injections  . Rheumatoid arthritis(714.0)    patient reported  . 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 Vasculitis ([place])    2/2 Levimasole toxicity. Followed by Dr. Louanne Skye    Past Surgical History:  Procedure Laterality Date  . HERNIA REPAIR     "stomach"  . I&D EXTREMITY Right 09/26/2015   Procedure: IRRIGATION AND DEBRIDEMENT LEG WOUND  VAC PLACEMENT.;  Surgeon: Loel Lofty Dillingham, DO;  Location: [facility];  Service: Plastics;  Laterality: Right;  . IRRIGATION AND DEBRIDEMENT ABSCESS Bilateral 09/26/2013   Procedure: DEBRIDEMENT ULCERS BILATERAL THIGHS;  Surgeon: Gwenyth Ober, MD;  Location: [facility];  Service: General;  Laterality: Bilateral;  . SKIN BIOPSY Bilateral 04/2010   shin nodules    Family History  Problem Relation Age of Onset  . Breast cancer Mother     Breast cancer  . Alcohol abuse Mother   . Colon cancer Maternal Aunt 9  . Alcohol abuse Father     Social History:  reports that she has been smoking Cigarettes.  She has a 10.20 pack-year smoking history. She has never used smokeless tobacco. She reports that she uses drugs, including "Crack" cocaine and Cocaine. She reports that she does not drink alcohol.  Allergies:  Allergies  Allergen Reactions  .  Acetaminophen Swelling and Other (See Comments)    Reaction:  Eyelid swelling    Medications: I have reviewed the patient's current medications.  No results found for this or any previous visit (from the past 48 hour(s)).  Dg Hand Complete Left  Result Date: 05/19/2016 CLINICAL DATA:  Abnormal third proximal interphalangeal joint. Question osteomyelitis. EXAM: LEFT HAND - COMPLETE 3+ VIEW COMPARISON:  April 27, 2016 FINDINGS: Chronic erosive changes of the fourth proximal interphalangeal joint are stable. The third proximal interphalangeal joint is dislocated. A well corticated lucency along the ulnar aspect of the middle phalanx is favored to represent a prominent nutrient foramen. No definitive fractures identified. However, a repeat film after relocation is recommended as evaluation for fracture is limited due to overlapping bones on this study. No other acute abnormalities are noted. No bony erosion to suggest osteomyelitis. IMPRESSION: 1. The third finger is dislocated at the proximal interphalangeal joint. There is a prominent nutrient foramen but no definitive fracture. However, evaluation for subtle fracture is limited due to overlapping bones and a repeat film after relocation is recommended. 2. Chronic erosive changes at the fourth proximal interphalangeal joint. These results will be called to the ordering clinician or representative by the Radiologist Assistant, and communication documented in the PACS or zVision Dashboard. Electronically Signed   By: Dorise Bullion III M.D   On: 05/19/2016 11:29  Review of Systems  Constitutional: Positive for malaise/fatigue and weight loss.  HENT: Negative for tinnitus.   Psychiatric/Behavioral: Positive for depression.       Chronic cocaine use   Blood pressure 134/67, pulse 79, temperature 97.7 F (36.5 C), resp. rate 16, height 5' 1"  (1.549 m), weight 40.1 kg (88 lb 6.5 oz), SpO2 100 %. Physical Exam  Constitutional:  40kg appears cachetic,  thin , malnurished  Eyes: Pupils are equal, round, and reactive to light.  Cardiovascular: Normal rate.   Respiratory: Effort normal.  GI: Soft.  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 Have posted her for finger amputation tomorrow AM Friday.  NPO after MN.                      [phone]  [name] C 05/21/2016, 7:20 AM

## 2016-05-22 NOTE — Progress Notes (Signed)
    Subjective: This morning, she appeared in right spirits. She is aware she has her procedure plan for today. She did complain of some sharp pain in her leg ulcers with movement and was not sure if it was related to the metronidazole cream which we have ordered to apply to these lesions. She also acknowledges hunger and looks forward to being able to eat after her procedure.  Objective:  Vital signs in last 24 hours: Vitals:   05/21/16 0607 05/21/16 1444 05/21/16 2146 05/22/16 0529  BP: 134/67 (!) 132/53 136/75 (!) 163/74  Pulse: 79 87 88 69  Resp: 16 17 17 18   Temp: 97.7 F (36.5 C) 98.6 F (37 C) 98.8 F (37.1 C) 97.9 F (36.6 C)  TempSrc:  Oral Oral Oral  SpO2: 100% 100% 100% 100%  Weight:      Height:       Physical Exam  Constitutional: She is oriented to person, place, and time. No distress.  HENT:  Head: Normocephalic and atraumatic.  Eyes: Conjunctivae are normal. No scleral icterus.  Cardiovascular: Normal rate and regular rhythm.   Pulmonary/Chest: Effort normal. No respiratory distress.  Abdominal: Soft. Bowel sounds are normal.  Neurological: She is alert and oriented to person, place, and time.  Skin: She is not diaphoretic.  Bilateral lower extremities covered with gauze and dressing with some serosanguineous drainage at the right lower extremity. Left third digit also covered in gauze and dressing.   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 Awaiting operative management today. -Continue prednisone 40 mg daily -Continue morphine 2 mg every 4 hours as needed -Continue metronidazole 0.75% gel to be applied to wounds -Follow blood culture x 2; no growth to date -Wound Care following, appreciate recommendations  Severe protein calorie malnutrition: Continue boost supplements 3 times daily with meals.  Hypertension: Blood pressures trending 130s-160s/50s-70s. -Continue lisinopril 10 mg daily  Dispo: Anticipated discharge in approximately 1-3 day(s).   Riccardo Dubin, MD 05/22/2016, 10:04 AM Pager: @MYPAGER @

## 2016-05-22 NOTE — OR Nursing (Signed)
Pt. Taken off contact isolation. Last PCR was June 2017. Sent PCR today,could not find one done for this admission. Use betadine nasal swab per orthopedic standing orders.

## 2016-05-22 NOTE — Transfer of Care (Signed)
Immediate Anesthesia Transfer of Care Note  Patient: [name]  Procedure(s) Performed: Procedure(s): AMPUTATION LEFT LONG FINGER (Left)  Patient Location: PACU  Anesthesia Type:General  Level of Consciousness: awake, alert  and oriented  Airway & Oxygen Therapy: Patient Spontanous Breathing and Patient connected to nasal cannula oxygen  Post-op Assessment: Report given to RN, Post -op Vital signs reviewed and stable and Patient moving all extremities X 4  Post vital signs: Reviewed and stable  Last Vitals:  Vitals:   05/21/16 2146 05/22/16 0529  BP: 136/75 (!) 163/74  Pulse: 88 69  Resp: 17 18  Temp: 37.1 C 36.6 C    Last Pain:  Vitals:   05/22/16 0830  TempSrc:   PainSc: 0-No pain      Patients Stated Pain Goal: 2 (68/15/94 7076)  Complications: No apparent anesthesia complications

## 2016-05-22 NOTE — Op Note (Signed)
[name], [name]                  ACCOUNT NO.:  [id number]  MEDICAL RECORD NO.:  [id number]  LOCATION:  [facility]                         FACILITY:  [facility]  PHYSICIAN:  Robbie Rideaux C. [name], M.D.    DATE OF BIRTH:  Nov 06, 1960  DATE OF PROCEDURE:  05/22/2016 DATE OF DISCHARGE:                              OPERATIVE REPORT   PREOPERATIVE DIAGNOSIS:  Left long finger vasculitis with necrosis and open proximal interphalangeal joint.  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  PROCEDURE:  Left long finger amputation.  SURGEON:  Shamere Campas C. [name], M.D.  ANESTHESIA:  General.  TOURNIQUET TIME:  5 minutes.  ESTIMATED BLOOD LOSS:  Minimal.  INDICATIONS FOR PROCEDURE:  This 55 year old female, long-term cocaine abuser with levamisole vasculitis.  She had been seen previously back in July, we had discussed with her with the involvement in her finger that she likely would lose her finger with continued drug use, which is not changed.  She has had multiple admissions.  The patient was readmitted with open PIP joint into the middle phalanx taking out through the skin and the extensors and collaterals were completely necrosed.  DESCRIPTION OF PROCEDURE:  After induction of anesthesia, forearm tourniquet, prepping and draping, extremity sheets, drapes, split sheets, time-out procedure was completed and Ancef was given prophylactically.  [place] incision was made with a longer dorsal than volar flap over the proximal phalanx, which was proximal areas of the vasculitis.  Skin was cut.  Neurovascular bundles were controlled with the bipolar.  Flexor extensors were allowed to retract after being divided.  Bone was divided using small bone biter, making sure there were no sharp spikes.  Copious irrigation.  Tourniquet deflated. Bipolar was used for small subcutaneous vein bleeding. Skin was approximated with 4-0 nylon, twiners, Xeroform,  digital block and Marcaine infiltration in the skin, a total of 5 mL for postoperative analgesia.  No epinephrine within the Marcaine.  A 2-inch Kling and 2- inch Coban were used for dressing and the patient was transferred to the recovery room.     Detria Cummings C. [name], M.D.     MCY/MEDQ  D:  05/22/2016  T:  05/22/2016  Job:  [id number]

## 2016-05-22 NOTE — Interval H&P Note (Signed)
History and Physical Interval Note:  05/22/2016 10:56 AM  [name]  has presented today for surgery, with the diagnosis of wound infection  The various methods of treatment have been discussed with the patient and family. After consideration of risks, benefits and other options for treatment, the patient has consented to  Procedure(s): AMPUTATION LEFT LONG FINGER (Left) as a surgical intervention .  The patient's history has been reviewed, patient examined, no change in status, stable for surgery.  I have reviewed the patient's chart and labs.  Questions were answered to the patient's satisfaction.     YATES,MARK C

## 2016-05-23 DIAGNOSIS — Z89022 Acquired absence of left finger(s): Secondary | ICD-10-CM

## 2016-05-23 LAB — CULTURE, BLOOD (ROUTINE X 2)
Culture: NO GROWTH
Culture: NO GROWTH

## 2016-05-23 LAB — BASIC METABOLIC PANEL
Anion gap: 6 (ref 5–15)
BUN: 23 mg/dL — AB (ref 6–20)
CALCIUM: 8.8 mg/dL — AB (ref 8.9–10.3)
CO2: 28 mmol/L (ref 22–32)
Chloride: 105 mmol/L (ref 101–111)
Creatinine, Ser: 0.95 mg/dL (ref 0.44–1.00)
GFR calc Af Amer: >60 mL/min (ref >60)
GLUCOSE: 98 mg/dL (ref 65–99)
POTASSIUM: 4.2 mmol/L (ref 3.5–5.1)
SODIUM: 139 mmol/L (ref 135–145)

## 2016-05-23 LAB — AEROBIC CULTURE W GRAM STAIN (SUPERFICIAL SPECIMEN)

## 2016-05-23 LAB — AEROBIC CULTURE  (SUPERFICIAL SPECIMEN)

## 2016-05-23 MED ORDER — METRONIDAZOLE 0.75 % EX GEL: 1.0000 "application " | Freq: Every morning | CUTANEOUS | 0 refills | Status: DC

## 2016-05-23 MED ORDER — OXYCODONE HCL 5 MG PO TABS: 5.0000 mg | ORAL_TABLET | Freq: Four times a day (QID) | ORAL | 0 refills | Status: DC | PRN

## 2016-05-23 MED ORDER — PREDNISONE 20 MG PO TABS: ORAL_TABLET | ORAL | 0 refills | Status: DC

## 2016-05-23 NOTE — Progress Notes (Signed)
    Subjective: This morning, she is in bright spirits and requesting to go home. She denies any pain, fever, chills, poor appetite. She is agreeable to following up with her doctors after the hospital.  Objective:  Vital signs in last 24 hours: Vitals:   05/22/16 2132 05/23/16 0216 05/23/16 0458 05/23/16 0936  BP: (!) 145/62 (!) 159/69 (!) 162/79 (!) 117/57  Pulse: 74 66 62 79  Resp: 18 18 17    Temp: 98.2 F (36.8 C) 98.6 F (37 C) 98.6 F (37 C) 99.3 F (37.4 C)  TempSrc: Oral Oral Oral Oral  SpO2: 100% 100% 99% 100%  Weight:      Height:       Physical Exam  Constitutional: She is oriented to person, place, and time. No distress.  HENT:  Head: Normocephalic and atraumatic.  Eyes: Conjunctivae are normal. No scleral icterus.  Cardiovascular: Normal rate and regular rhythm.   Pulmonary/Chest: Effort normal. No respiratory distress.  Abdominal: Soft. Bowel sounds are normal.  Neurological: She is alert and oriented to person, place, and time.  Skin: She is not diaphoretic.  Bilateral lower extremities covered with gauze and dressing. Left third digit stump also covered in gauze and dressing.     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 Blood cultures no growth to date, and she remains afebile again. -Continue prednisone 40 mg daily with plan to taper by 71m over next 4 weeks -Prescribed her  oxy IR 5 mg x 15 tablets to be taken 1 tablet every 6 hours as needed until follow-up -Continue metronidazole 0.75% gel to be applied to wounds -Recommend she keep her left hand dressing on until she follows up with orthopedist  Severe protein calorie malnutrition: Continue boost supplements 3 times daily with meals.  Hypertension: Blood pressures trending 145-176/60s-99. -Continue lisinopril 10 mg daily  Dispo: Anticipated discharge in approximately 1-3 day(s).   RRiccardo Dubin MD 05/23/2016, 2:03 PM Pager: @MYPAGER @

## 2016-05-23 NOTE — Anesthesia Postprocedure Evaluation (Signed)
Anesthesia Post Note  Patient: [name]  Procedure(s) Performed: Procedure(s) (LRB): AMPUTATION LEFT LONG FINGER (Left)  Patient location during evaluation: PACU Anesthesia Type: General Level of consciousness: awake and alert and patient cooperative Pain management: pain level controlled Vital Signs Assessment: post-procedure vital signs reviewed and stable Respiratory status: spontaneous breathing and respiratory function stable Cardiovascular status: stable Anesthetic complications: no    Last Vitals:  Vitals:   05/23/16 0216 05/23/16 0458  BP: (!) 159/69 (!) 162/79  Pulse: 66 62  Resp: 18 17  Temp: 37 C 37 C    Last Pain:  Vitals:   05/23/16 0458  TempSrc: Oral  PainSc:                  [place]

## 2016-05-23 NOTE — Discharge Instructions (Signed)
Keep the wound on your left hand bandaged and dry until you see [name] (orthopedic surgeon) in clinic.   Before showering, wrap left hand in plastic or water-proof material to prevent bandage from getting wet.   Stimulant Use Disorder-Cocaine Cocaine is one of a group of powerful drugs called stimulants. Cocaine has medical uses for stopping nosebleeds and for pain control before minor nose or dental surgery. However, cocaine is misused because of the effects that it produces. These effects include:   A feeling of extreme pleasure.  Alertness.  High energy. Common street names for cocaine include coke, crack, blow, snow, and nose candy. Cocaine is snorted, dissolved in water and injected, or smoked.  Stimulants are addictive because they activate regions of the brain that produce both the pleasurable sensation of "reward" and psychological dependence. Together, these actions account for loss of control and the rapid development of drug dependence. This means you become ill without the drug (withdrawal) and need to keep using it to function.  Stimulant use disorder is use of stimulants that disrupts your daily life. It disrupts relationships with family and friends and how you do your job. Cocaine increases your blood pressure and heart rate. It can cause a heart attack or stroke. Cocaine can also cause death from irregular heart rate or seizures. SYMPTOMS Symptoms of stimulant use disorder with cocaine include:  Use of cocaine in larger amounts or over a longer period of time than intended.  Unsuccessful attempts to cut down or control cocaine use.  A lot of time spent obtaining, using, or recovering from the effects of cocaine.  A strong desire or urge to use cocaine (craving).  Continued use of cocaine in spite of major problems at work, school, or home because of use.  Continued use of cocaine in spite of relationship problems because of use.  Giving up or cutting down on  important life activities because of cocaine use.  Use of cocaine over and over in situations when it is physically hazardous, such as driving a car.  Continued use of cocaine in spite of a physical problem that is likely related to use. Physical problems can include:  Malnutrition.  Nosebleeds.  Chest pain.  High blood pressure.  A hole that develops between the part of your nose that separates your nostrils (perforated nasal septum).  Lung and kidney damage.  Continued use of cocaine in spite of a mental problem that is likely related to use. Mental problems can include:  Schizophrenia-like symptoms.  Depression.  Bipolar mood swings.  Anxiety.  Sleep problems.  Need to use more and more cocaine to get the same effect, or lessened effect over time with use of the same amount of cocaine (tolerance).  Having withdrawal symptoms when cocaine use is stopped, or using cocaine to reduce or avoid withdrawal symptoms. Withdrawal symptoms include:  Depressed or irritable mood.  Low energy or restlessness.  Bad dreams.  Poor or excessive sleep.  Increased appetite. DIAGNOSIS Stimulant use disorder is diagnosed by your health care provider. You may be asked questions about your cocaine use and how it affects your life. A physical exam may be done. A drug screen may be ordered. You may be referred to a mental health professional. The diagnosis of stimulant use disorder requires at least two symptoms within 12 months. The type of stimulant use disorder depends on the number of signs and symptoms you have. The type may be:  Mild. Two or three signs and symptoms.  Moderate. Four or five signs and symptoms.  Severe. Six or more signs and symptoms. TREATMENT Treatment for stimulant use disorder is usually provided by mental health professionals with training in substance use disorders. The following options are available:  Counseling or talk therapy. Talk therapy addresses the  reasons you use cocaine and ways to keep you from using again. Goals of talk therapy include:  Identifying and avoiding triggers for use.  Handling cravings.  Replacing use with healthy activities.  Support groups. Support groups provide emotional support, advice, and guidance.  Medicine. Certain medicines may decrease cocaine cravings or withdrawal symptoms. HOME CARE INSTRUCTIONS  Take medicines only as directed by your health care provider.  Identify the people and activities that trigger your cocaine use and avoid them.  Keep all follow-up visits as directed by your health care provider. SEEK MEDICAL CARE IF:  Your symptoms get worse or you relapse.  You are not able to take medicines as directed. SEEK IMMEDIATE MEDICAL CARE IF:  You have serious thoughts about hurting yourself or others.  You have a seizure, chest pain, sudden weakness, or loss of speech or vision. [place] on Drug Abuse: motorcyclefax.com  Substance Abuse and Mental Health Services Administration: ktimeonline.com   This information is not intended to replace advice given to you by your health care provider. Make sure you discuss any questions you have with your health care provider.   Document Released: 09/18/2000 Document Revised: 10/12/2014 Document Reviewed: 10/04/2013 Elsevier Interactive Patient Education [employer].

## 2016-05-23 NOTE — Progress Notes (Signed)
Discharge home. Home diachrge instruction given, no question verbalized.

## 2016-05-23 NOTE — Discharge Summary (Signed)
Name: [name] MRN: [id number] DOB: Nov 20, 1960 55 y.o. PCP: Zada Finders, MD  Date of Admission: 05/18/2016  2:05 PM Date of Discharge: 05/23/2016 Attending Physician: Annia Belt, MD  Discharge Diagnosis: Principal Problem:   Cocaine-induced vascular disorder [facility]) Active Problems:   Essential hypertension   Cocaine dependence ([place])   Protein-calorie malnutrition, severe ([place])   Finger necrosis ([place])   Discharge Medications:   Medication List    TAKE these medications   bacitracin ointment Apply 1 application topically 2 (two) times daily.   feeding supplement Liqd Take 1 Container by mouth 3 (three) times daily between meals.   lisinopril 10 MG tablet Commonly known as:  PRINIVIL,ZESTRIL Take 1 tablet (10 mg total) by mouth daily.   metroNIDAZOLE 0.75 % gel Commonly known as:  METROGEL Apply 1 application topically 2 (two) times daily. What changed:  Another medication with the same name was added. Make sure you understand how and when to take each.   metroNIDAZOLE 0.75 % gel Commonly known as:  METROGEL Apply 1 application topically every morning. What changed:  You were already taking a medication with the same name, and this prescription was added. Make sure you understand how and when to take each.   oxyCODONE 5 MG immediate release tablet Commonly known as:  Oxy IR/ROXICODONE Take 1 tablet (5 mg total) by mouth every 6 (six) hours as needed for severe pain.   predniSONE 20 MG tablet Commonly known as:  DELTASONE 2 tablet daily with breakfast for 1 week, then 1.5 tablet daily for 1 week, then 1 tablet daily for 1 week, then half tablet for 1 week. What changed:  Another medication with the same name was added. Make sure you understand how and when to take each.   predniSONE 20 MG tablet Commonly known as:  DELTASONE Take 4 tablets x 7 days.Take 3 tablets x 7 days. Take 2 tablets x 7 days. Take 1 x 7 days. What changed:  You were already taking  a medication with the same name, and this prescription was added. Make sure you understand how and when to take each.       Disposition and follow-up:   [name] was discharged from [facility] in Stable condition.    Follow-up Appointments: Follow-up Information    YATES,MARK C, MD. Schedule an appointment as soon as possible for a visit in 1 week(s).   Specialty:  Orthopedic Surgery Why:  Call the [facility] office at [phone] to make a follow-up appointment with [name] this week. Contact information: [place] [address] [phone]        CLAIRE S DILLINGHAM, DO. Schedule an appointment as soon as possible for a visit today.   Specialty:  Plastic Surgery Contact information: [place] [address] [phone]        [facility]. Schedule an appointment as soon as possible for a visit in 1 week(s).   Contact information: [address] [place] Hospital Course by problem list: Principal Problem:   Cocaine-induced vascular disorder [facility]) Active Problems:   Essential hypertension   Cocaine dependence ([place])   Protein-calorie malnutrition, severe ([place])   Finger necrosis ([place])   [name] is a 55 year old female with levamisole-induced vasculitis with resulting necrotic ulcers and hypertension hospitalized for worsened appearance of her vasculitic wounds.  Necrotic ulcers secondary to levamisole-induced vasculitis: Recently admitted 7/24-7/28 for management of the wounds on her lower extremities and left third finger. She was discharged on 1 week of doxycycline and a 4 week prednisone taper. She states she "completed" these medications despite having a remaining two weeks left of the prednisone course. She was unable to make her outpatient wound care appointment and noticed worsening yellow discharge, bleeding, and  pain from wounds. In the Emergency Department, she was noted to be afebrile with mild leukocytosis likely secondary to recent steroid use. Blood cultures were drawn but revealed no growth during admission. She received Solumedrol which was transitioned to prednisone 40 mg for the remainder of her hospital course. Antibiotics were held as wounds did not appear infected. An x-ray of the left hand [see below] showed dislocation of the third finger at the DIP/PIP joint and chronic erosion. Wound Care and Orthopedic Surgery were consulted to provide recommendations and decided amputation of the third finger was necessary for the patient's comfort and infection prevention. She underwent a left finger amputation on 8/18. She was able to eat and ambulate post-op and did not show any signs of infection. She was discharged to home on 8/19 on a 4-week course of prednisone 2m which a planned 146mweek taper along with scheduled follow-up with Orthopedic Surgery and Plastic Surgery. -Please assess adherence to prednisone and consider adjusting taper regimen as needed -Please verify follow-up with Orthopedic Surgery and Plastic Surgery [see dates above]  Hypertension: Her home lisinopril was restarted and blood pressure improved and stabilized for the remainder of her admission.   Discharge Vitals:   BP (!) 117/57 (BP Location: Right Arm)   Pulse 79   Temp 99.3 F (37.4 C) (Oral)   Resp 17   Ht 5' 1"  (1.549 m)   Wt 88 lb 6.5 oz (40.1 kg)   SpO2 100%   BMI 16.70 kg/m   Pertinent Labs, Studies, and Procedures:   Left hand XR 05/19/16 FINDINGS: Chronic erosive changes of the fourth proximal interphalangeal joint are stable. The third proximal interphalangeal joint is dislocated. A well corticated lucency along the ulnar aspect of the middle phalanx is favored to represent a prominent nutrient foramen. No definitive fractures identified. However, a repeat film after relocation is recommended as evaluation for  fracture is limited due to overlapping bones on this study. No other acute abnormalities are noted. No bony erosion to suggest osteomyelitis.  IMPRESSION: 1. The third finger is dislocated at the proximal interphalangeal joint. There is a prominent nutrient foramen but no definitive fracture. However, evaluation for subtle fracture is limited due to overlapping bones and a repeat film after relocation is recommended.  2. Chronic erosive changes at the fourth proximal interphalangeal Joint.    Discharge Instructions: Discharge Instructions    Call MD for:  persistant nausea and vomiting    Complete by:  As directed   Call MD for:  severe uncontrolled pain    Complete by:  As directed   Call MD for:  temperature >100.4    Complete by:  As directed   Discharge wound care:    Complete by:  As directed   See patient instructions.   Increase activity slowly    Complete by:  As directed      Signed: RuRiccardo DubinMD 05/23/2016, 1:19 PM   Pager: @MYPAGER @

## 2016-05-25 ENCOUNTER — Encounter (HOSPITAL_COMMUNITY): Payer: Self-pay | Admitting: Orthopaedic Surgery

## 2016-05-25 LAB — CRYOGLOBULIN

## 2016-05-26 ENCOUNTER — Telehealth: Payer: Self-pay | Admitting: Licensed Clinical Social Worker

## 2016-05-26 NOTE — Telephone Encounter (Signed)
CSW received PCS request on behalf of [name].  CSW attempted to contact [name] at the contact number listed on chart, 704 area code.  Female answered and states the phone number did not belong to [name] and he did not know the person.  CSW attempted phone number listed on PCS form, left message requesting return call.

## 2016-05-26 NOTE — Discharge Summary (Signed)
Medicine attending discharge note: I personally examined this patient on the day of discharge together with resident physician Dr. Charlott Rakes and acting intern Ms. Pershing Cox and I attest to the accuracy of their discharge evaluation and plan.   Clinical summary: The patient is hopelessly addicted to crack cocaine. She has developed recurrent  multifocal areas of vasculitis with ischemic skin breakdown on the skin of her lower extremities and her fingers. Previous areas of involvement have affected the cartilage on the tip of her nose and ear lobes. She was readmitted on August 14. 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 The bone is now exposed to the open air and it appears that the DIP has disarticulated from the PIP.   Hospital course:  She was afebrile on admission. Initial white blood count 13,600 with 73% neutrophils. She was started on parenteral antibiotics and a trial of steroids. today. Orthopedic surgery initially  reluctant to operate on this lady unless she stops using cocaine. However, given the advanced nature of the local problem and high risk for superinfection, the patient did undergo amputation of the third digit of her left hand on August 18. She tolerated the procedure well. Blood cultures drawn on admission remain sterile. White blood count remained minimally and chronically elevated compared with her baseline with most recent value 10,700 on August 18. Necrotic ulcers involving the skin of both of her lower extremities and feet were attended to by wound care service. Local application of metronidazole gel and nonocclusive dressings. Renal function normal with BUN 18 creatinine 0.9. Liver functions normal. Initial lactic acid normal. No other acute issues identified during this admission.  Disposition: Condition stable at discharge She will follow-up in our Gen. medicine clinic. There were no complications.

## 2016-05-28 ENCOUNTER — Ambulatory Visit (INDEPENDENT_AMBULATORY_CARE_PROVIDER_SITE_OTHER): Payer: Medicaid Other | Admitting: Internal Medicine

## 2016-05-28 VITALS — BP 163/78 | HR 91 | Temp 98.5°F | Wt 98.7 lb

## 2016-05-28 DIAGNOSIS — L958 Other vasculitis limited to the skin: Secondary | ICD-10-CM | POA: Diagnosis not present

## 2016-05-28 DIAGNOSIS — F17210 Nicotine dependence, cigarettes, uncomplicated: Secondary | ICD-10-CM | POA: Diagnosis not present

## 2016-05-28 DIAGNOSIS — F14288 Cocaine dependence with other cocaine-induced disorder: Secondary | ICD-10-CM | POA: Diagnosis not present

## 2016-05-28 DIAGNOSIS — I10 Essential (primary) hypertension: Secondary | ICD-10-CM | POA: Diagnosis not present

## 2016-05-28 DIAGNOSIS — Z89022 Acquired absence of left finger(s): Secondary | ICD-10-CM

## 2016-05-28 DIAGNOSIS — I999 Unspecified disorder of circulatory system: Principal | ICD-10-CM

## 2016-05-28 DIAGNOSIS — Z720 Tobacco use: Secondary | ICD-10-CM

## 2016-05-28 DIAGNOSIS — F14988 Cocaine use, unspecified with other cocaine-induced disorder: Secondary | ICD-10-CM

## 2016-05-28 MED ORDER — LISINOPRIL 10 MG PO TABS: 10.0000 mg | ORAL_TABLET | Freq: Every day | ORAL | 5 refills | Status: DC

## 2016-05-28 NOTE — Assessment & Plan Note (Addendum)
Patient counseled that the proper treatment for her vasculitis is cocaine cessation. She verbalized understanding however is not ready to stop at this time. Last use earlier this week per pt.

## 2016-05-28 NOTE — Progress Notes (Signed)
   CC: follow up of Levimasole/Cocaine-induced vasculitis.  HPI:  Ms.Damyra D Weldin is a 55 y.o. female with medical history significant for Levimasole/Cocaine-induced vasulitis, HTN and HLD who presents for a hospital admission follow up. The patient was admitted 8/14-8/18 for severe pain of her chonic vasculitis-associated wounds. On evaluation during admission there was not significant purulent discharge and had recently completed several courses of antibiotics. During admission she was treated with steroids and had amputation of her left 3rd digit secondary to deep wounds with visible bone associated with her vasculitis. Patient is here today for follow up. She reports her pain is well controled with Aleve twice daily. She requests oral doxycycline because it started clearing up her lesions after one of her recent discharges. Denies any fevers or chills. Reports last cocaine use earlier this week.   She reports she has yet to make a follow up appointments for wound care, orthopedics and plastic surgery.   Past Medical History:  Diagnosis Date  . CAP (community acquired pneumonia) 03/2014 X 2  . Cocaine abuse    ongoing with resultant vaculitis.  . Daily headache   . Depression   . Hypertension   . Inflammatory arthritis ([place])   . Migraines    "probably 5-6/yr" (04/21/2014)  . Normocytic anemia    BL Hgb 9.8-12. Last anemia panel 04/2010 - showing Fe 19, ferritin 101.  Pt on monthly B12 injections  . Rheumatoid arthritis(714.0)    patient reported  . 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 Vasculitis ([place])    2/2 Levimasole toxicity. Followed by Dr. Louanne Skye    Review of Systems:  Review of Systems  Constitutional: Negative for chills and fever.  Respiratory: Negative for cough, sputum production and shortness of breath.   Cardiovascular: Negative for chest pain, palpitations, orthopnea and leg swelling.  Gastrointestinal: Negative for constipation,  diarrhea, heartburn, nausea and vomiting.  Genitourinary: Negative for dysuria and frequency.   Physical Exam: Physical Exam  Constitutional: She is well-developed, well-nourished, and in no distress.  HENT:  Head: Normocephalic and atraumatic.  Evidence of prior tissue loss of BL helices and nose. Well healed.  Eyes: Pupils are equal, round, and reactive to light.  Cardiovascular: Normal rate and regular rhythm.   Pulmonary/Chest: Effort normal and breath sounds normal. No respiratory distress.  Abdominal: Soft. Bowel sounds are normal. She exhibits no distension. There is no tenderness.  Musculoskeletal: She exhibits no edema.  Skin: Skin is warm and dry. She is not diaphoretic.  Left 3rd digit amputated. Stitches still in place. No drainage or erythema around surgical site. Patient has several large chronic wounds present on BL LE. No significant purulent drainage noted.  Diffuse hyperpigmented markings present on body, likely from prior healed ulcerations.    Vitals:   05/28/16 1350  BP: (!) 163/78  Pulse: 91  Temp: 98.5 F (36.9 C)  TempSrc: Oral  Weight: 98 lb 11.2 oz (44.8 kg)     Assessment & Plan:   See Encounters Tab for problem based charting.  Patient seen with Dr. Evette Doffing

## 2016-05-28 NOTE — Assessment & Plan Note (Signed)
Patient here for follow up of recent hospitalization for levamisole/cocaine-induced vasculitis. She reports her pain is under control with the use of Aleve twice daily. Patient requested oral antibiotics as they have helped her wounds heal in the past however on physical exam there was no significant evidence for bacterial infection. Will defer systemic antibiotics at this time.  -Patient counseled that the ultimate treatment for her condition is cocaine cessation and that she should not expect resolution of her symptoms without cessation. She verbalized understanding however does not wish to stop at this time.  -Patient was discharged home from the hospital on a steroid taper. Instructions for completing this taper were reviewed with the patient as well as compliance with this medication.  -Contact information was supplied to the patient for Orthopedics and Plastic Surgery so that she can make the previously recommended appointments.

## 2016-05-28 NOTE — Assessment & Plan Note (Addendum)
BP elevated during this visit at 163/78. Patient reports she ran out of her Lisinopril and hasn't taken it in 3 days. Requests refill of this. -Refill ordered of Lisinopril.  -Will continue to monitor blood pressure -Patient states she checks her blood pressure occasionally at the pharmacy.

## 2016-05-28 NOTE — Patient Instructions (Addendum)
It was a pleasures seeing you again today! I am glad you are feeling better since your hospital discharge.   1. Today we talked about your hypertension. I am sending in a refill of your Lisinopril. Please continue to take this as directed.  2. Today we also talked about your chronic wounds. I know that you requested Doxycycline however at this point, I do not feel that they are infected. You can continue to use your topical medications for your wounds. I'm glad your pain is relieved with Aleve. This is okay to continue to take at your current dosage. 3. Please make a follow up appointment with Plastic Surgery and Orthopedics.   Orthopedics - Dr. Mateo Flow  [phone]  [address] [place] [address]    Plastic Surgery - Dr. Marla Roe  [phone]  [place] [address] 4. Please follow up with wound care. 5. Please follow up here in the Clinic in 3 months or sooner if needed. 6. Please follow the instructions for the Prednisone taper from the bottle you showed me today in clinic.

## 2016-05-29 NOTE — Progress Notes (Signed)
Internal Medicine Clinic Attending  I saw and evaluated the patient.  I personally confirmed the key portions of the history and exam documented by Dr. Danford Bad and I reviewed pertinent patient test results.  The assessment, diagnosis, and plan were formulated together and I agree with the documentation in the resident's note.

## 2016-06-01 NOTE — Telephone Encounter (Signed)
During pt's 05/28/16 [facility] appointment, pt indicates she is independent with ADL's and did not discuss need for [facility] with physician during appointment.

## 2016-06-09 ENCOUNTER — Ambulatory Visit: Payer: Self-pay

## 2016-06-14 ENCOUNTER — Inpatient Hospital Stay (HOSPITAL_COMMUNITY)
Admission: EM | Admit: 2016-06-14 | Discharge: 2016-06-19 | DRG: 565 | Disposition: A | Discharge status: Home Health | Payer: Medicaid Other | Attending: Student in an Organized Health Care Education/Training Program | Admitting: Student in an Organized Health Care Education/Training Program

## 2016-06-14 ENCOUNTER — Inpatient Hospital Stay (HOSPITAL_COMMUNITY): Payer: Medicaid Other

## 2016-06-14 ENCOUNTER — Emergency Department (HOSPITAL_COMMUNITY): Payer: Medicaid Other

## 2016-06-14 ENCOUNTER — Encounter (HOSPITAL_COMMUNITY): Payer: Self-pay | Admitting: Emergency Medicine

## 2016-06-14 DIAGNOSIS — T8131XA Disruption of external operation (surgical) wound, not elsewhere classified, initial encounter: Secondary | ICD-10-CM

## 2016-06-14 DIAGNOSIS — M069 Rheumatoid arthritis, unspecified: Secondary | ICD-10-CM | POA: Diagnosis present

## 2016-06-14 DIAGNOSIS — M779 Enthesopathy, unspecified: Secondary | ICD-10-CM | POA: Diagnosis present

## 2016-06-14 DIAGNOSIS — F1420 Cocaine dependence, uncomplicated: Secondary | ICD-10-CM | POA: Diagnosis present

## 2016-06-14 DIAGNOSIS — F1410 Cocaine abuse, uncomplicated: Secondary | ICD-10-CM | POA: Diagnosis present

## 2016-06-14 DIAGNOSIS — F14988 Cocaine use, unspecified with other cocaine-induced disorder: Secondary | ICD-10-CM | POA: Diagnosis present

## 2016-06-14 DIAGNOSIS — T8781 Dehiscence of amputation stump: Principal | ICD-10-CM | POA: Diagnosis present

## 2016-06-14 DIAGNOSIS — I10 Essential (primary) hypertension: Secondary | ICD-10-CM

## 2016-06-14 DIAGNOSIS — L0390 Cellulitis, unspecified: Secondary | ICD-10-CM | POA: Diagnosis present

## 2016-06-14 DIAGNOSIS — Y835 Amputation of limb(s) as the cause of abnormal reaction of the patient, or of later complication, without mention of misadventure at the time of the procedure: Secondary | ICD-10-CM | POA: Diagnosis present

## 2016-06-14 DIAGNOSIS — E876 Hypokalemia: Secondary | ICD-10-CM | POA: Diagnosis present

## 2016-06-14 DIAGNOSIS — M609 Myositis, unspecified: Secondary | ICD-10-CM | POA: Diagnosis present

## 2016-06-14 DIAGNOSIS — L03115 Cellulitis of right lower limb: Secondary | ICD-10-CM | POA: Diagnosis present

## 2016-06-14 DIAGNOSIS — IMO0001 Reserved for inherently not codable concepts without codable children: Secondary | ICD-10-CM | POA: Diagnosis present

## 2016-06-14 DIAGNOSIS — F14288 Cocaine dependence with other cocaine-induced disorder: Secondary | ICD-10-CM | POA: Diagnosis not present

## 2016-06-14 DIAGNOSIS — E46 Unspecified protein-calorie malnutrition: Secondary | ICD-10-CM | POA: Diagnosis present

## 2016-06-14 DIAGNOSIS — Z9114 Patient's other noncompliance with medication regimen: Secondary | ICD-10-CM | POA: Diagnosis not present

## 2016-06-14 DIAGNOSIS — F17210 Nicotine dependence, cigarettes, uncomplicated: Secondary | ICD-10-CM

## 2016-06-14 DIAGNOSIS — M7750 Other enthesopathy of unspecified foot: Secondary | ICD-10-CM

## 2016-06-14 DIAGNOSIS — I999 Unspecified disorder of circulatory system: Secondary | ICD-10-CM

## 2016-06-14 DIAGNOSIS — T8130XA Disruption of wound, unspecified, initial encounter: Secondary | ICD-10-CM | POA: Diagnosis present

## 2016-06-14 DIAGNOSIS — N179 Acute kidney failure, unspecified: Secondary | ICD-10-CM | POA: Diagnosis present

## 2016-06-14 DIAGNOSIS — M60009 Infective myositis, unspecified site: Secondary | ICD-10-CM

## 2016-06-14 DIAGNOSIS — Z681 Body mass index (BMI) 19 or less, adult: Secondary | ICD-10-CM | POA: Diagnosis not present

## 2016-06-14 DIAGNOSIS — M869 Osteomyelitis, unspecified: Secondary | ICD-10-CM

## 2016-06-14 DIAGNOSIS — L97213 Non-pressure chronic ulcer of right calf with necrosis of muscle: Secondary | ICD-10-CM

## 2016-06-14 DIAGNOSIS — L958 Other vasculitis limited to the skin: Secondary | ICD-10-CM | POA: Diagnosis not present

## 2016-06-14 DIAGNOSIS — I776 Arteritis, unspecified: Secondary | ICD-10-CM | POA: Diagnosis present

## 2016-06-14 DIAGNOSIS — Z89022 Acquired absence of left finger(s): Secondary | ICD-10-CM

## 2016-06-14 DIAGNOSIS — B9689 Other specified bacterial agents as the cause of diseases classified elsewhere: Secondary | ICD-10-CM | POA: Diagnosis not present

## 2016-06-14 DIAGNOSIS — F339 Major depressive disorder, recurrent, unspecified: Secondary | ICD-10-CM | POA: Diagnosis present

## 2016-06-14 DIAGNOSIS — L02415 Cutaneous abscess of right lower limb: Secondary | ICD-10-CM | POA: Diagnosis present

## 2016-06-14 LAB — CBC WITH DIFFERENTIAL/PLATELET
BASOS PCT: 0 %
Basophils Absolute: 0 10*3/uL (ref 0.0–0.1)
EOS ABS: 0 10*3/uL (ref 0.0–0.7)
Eosinophils Relative: 0 %
HEMATOCRIT: 37.8 % (ref 36.0–46.0)
HEMOGLOBIN: 12.5 g/dL (ref 12.0–15.0)
LYMPHS ABS: 1.3 10*3/uL (ref 0.7–4.0)
Lymphocytes Relative: 10 %
MCH: 28.9 pg (ref 26.0–34.0)
MCHC: 33.1 g/dL (ref 30.0–36.0)
MCV: 87.5 fL (ref 78.0–100.0)
MONOS PCT: 5 %
Monocytes Absolute: 0.7 10*3/uL (ref 0.1–1.0)
NEUTROS ABS: 11.4 10*3/uL — AB (ref 1.7–7.7)
NEUTROS PCT: 85 %
Platelets: 376 10*3/uL (ref 150–400)
RBC: 4.32 MIL/uL (ref 3.87–5.11)
RDW: 15.7 % — ABNORMAL HIGH (ref 11.5–15.5)
WBC: 13.5 10*3/uL — AB (ref 4.0–10.5)

## 2016-06-14 LAB — COMPREHENSIVE METABOLIC PANEL
ALT: 8 U/L — ABNORMAL LOW (ref 14–54)
ANION GAP: 12 (ref 5–15)
AST: 15 U/L (ref 15–41)
Albumin: 3.5 g/dL (ref 3.5–5.0)
Alkaline Phosphatase: 93 U/L (ref 38–126)
BUN: 27 mg/dL — ABNORMAL HIGH (ref 6–20)
CALCIUM: 9.1 mg/dL (ref 8.9–10.3)
CHLORIDE: 102 mmol/L (ref 101–111)
CO2: 24 mmol/L (ref 22–32)
Creatinine, Ser: 1.31 mg/dL — ABNORMAL HIGH (ref 0.44–1.00)
GFR, EST AFRICAN AMERICAN: 53 mL/min — AB (ref >60)
GFR, EST NON AFRICAN AMERICAN: 46 mL/min — AB (ref >60)
Glucose, Bld: 130 mg/dL — ABNORMAL HIGH (ref 65–99)
Potassium: 3.9 mmol/L (ref 3.5–5.1)
SODIUM: 138 mmol/L (ref 135–145)
Total Bilirubin: 1.1 mg/dL (ref 0.3–1.2)
Total Protein: 7.8 g/dL (ref 6.5–8.1)

## 2016-06-14 LAB — I-STAT CG4 LACTIC ACID, ED: LACTIC ACID, VENOUS: 1.7 mmol/L (ref 0.5–1.9)

## 2016-06-14 MED ORDER — ONDANSETRON HCL 4 MG/2ML IJ SOLN
4.0000 mg | Freq: Once | INTRAMUSCULAR | Status: AC
  Administered 2016-06-14: 4 mg via INTRAVENOUS
  Filled 2016-06-14: qty 2

## 2016-06-14 MED ORDER — ENOXAPARIN SODIUM 30 MG/0.3ML ~~LOC~~ SOLN: 30.0000 mg | SUBCUTANEOUS | Status: DC

## 2016-06-14 MED ORDER — OXYCODONE HCL 5 MG PO TABS
5.0000 mg | ORAL_TABLET | ORAL | Status: DC | PRN
  Administered 2016-06-14 – 2016-06-19 (×22): 5 mg via ORAL
  Filled 2016-06-14 (×22): qty 1

## 2016-06-14 MED ORDER — SODIUM CHLORIDE 0.9 % IV SOLN
INTRAVENOUS | Status: DC
  Administered 2016-06-14: 19:00:00 via INTRAVENOUS

## 2016-06-14 MED ORDER — GADOBENATE DIMEGLUMINE 529 MG/ML IV SOLN
10.0000 mL | Freq: Once | INTRAVENOUS | Status: AC | PRN
  Administered 2016-06-14: 10 mL via INTRAVENOUS

## 2016-06-14 MED ORDER — SODIUM CHLORIDE 0.9 % IV BOLUS (SEPSIS)
1000.0000 mL | Freq: Once | INTRAVENOUS | Status: AC
  Administered 2016-06-14: 1000 mL via INTRAVENOUS

## 2016-06-14 MED ORDER — HEPARIN SODIUM (PORCINE) 5000 UNIT/ML IJ SOLN
5000.0000 [IU] | Freq: Three times a day (TID) | INTRAMUSCULAR | Status: DC
  Administered 2016-06-14 – 2016-06-19 (×15): 5000 [IU] via SUBCUTANEOUS
  Filled 2016-06-14 (×14): qty 1

## 2016-06-14 MED ORDER — ONDANSETRON HCL 4 MG/2ML IJ SOLN: 4.0000 mg | Freq: Four times a day (QID) | INTRAMUSCULAR | Status: DC | PRN

## 2016-06-14 MED ORDER — VANCOMYCIN HCL IN DEXTROSE 750-5 MG/150ML-% IV SOLN
750.0000 mg | INTRAVENOUS | Status: DC
  Administered 2016-06-15 – 2016-06-16 (×2): 750 mg via INTRAVENOUS
  Filled 2016-06-14 (×3): qty 150

## 2016-06-14 MED ORDER — MORPHINE SULFATE (PF) 4 MG/ML IV SOLN
4.0000 mg | Freq: Once | INTRAVENOUS | Status: AC
  Administered 2016-06-14: 4 mg via INTRAVENOUS
  Filled 2016-06-14: qty 1

## 2016-06-14 MED ORDER — VANCOMYCIN HCL IN DEXTROSE 1-5 GM/200ML-% IV SOLN
1000.0000 mg | Freq: Once | INTRAVENOUS | Status: AC
  Administered 2016-06-14: 1000 mg via INTRAVENOUS
  Filled 2016-06-14: qty 200

## 2016-06-14 MED ORDER — ONDANSETRON HCL 4 MG PO TABS: 4.0000 mg | ORAL_TABLET | Freq: Four times a day (QID) | ORAL | Status: DC | PRN

## 2016-06-14 NOTE — ED Notes (Signed)
I have just given report to [place] from [place], who are en route to [facility].  Pt. Remains in no distress and all of her suppurative wounds were dressed.

## 2016-06-14 NOTE — Discharge Summary (Signed)
Name: [name] MRN: [id number] DOB: [birth year] 55 y.o. PCP: Zada Finders, MD  Date of Admission: 06/14/2016 10:50 AM Date of Discharge: 06/19/2016 Attending Physician: Axel Filler, MD  Discharge Diagnosis:  1. Lower Extremity Ulcers  2. Wound Dehiscence  Principal Problem:   Cocaine-induced vascular disorder (HCC) Active Problems:   Essential hypertension   Major depression, recurrent (HCC)   Cocaine use disorder, severe, dependence (HCC)   Wound dehiscence   Myalgia and myositis   Tendonitis of ankle   Infectious myositis   Discharge Medications:   Medication List    STOP taking these medications   predniSONE 20 MG tablet Commonly known as:  DELTASONE     TAKE these medications   feeding supplement Liqd Take 1 Container by mouth 3 (three) times daily between meals.   lisinopril 10 MG tablet Commonly known as:  PRINIVIL,ZESTRIL Take 1 tablet (10 mg total) by mouth daily.   metroNIDAZOLE 0.75 % gel Commonly known as:  METROGEL Apply 1 application topically every morning.   naproxen sodium 220 MG tablet Commonly known as:  ANAPROX Take 440 mg by mouth every 12 (twelve) hours as needed (pain).   oxyCODONE 5 MG immediate release tablet Commonly known as:  ROXICODONE Take 1 tablet (5 mg total) by mouth every 6 (six) hours as needed for severe pain.   sulfamethoxazole-trimethoprim 800-160 MG tablet Commonly known as:  BACTRIM DS,SEPTRA DS Take 1 tablet by mouth every 12 (twelve) hours.       Disposition and follow-up:   [name] was discharged from [facility] in Stable condition.  At the hospital follow up visit please address:  1.  Wound healing and dressing.  Please assess her wounds, and provide supplies for dressing changes at home daily.  See images in H&P.  2.  Systemic infection?  Assess for febrile illness, signs of systemic infection or sepsis.  3.  Pain.  Prescribed 30x 69m oxycodone on discharge.  If  pain severely limits her mobility, will need to develop pain management plan.   4.  Cocaine use.  Encourage cessation and document use.  If she can abstain, surgical options may become available.  5.  Labs / imaging needed at time of follow-up: none  6.  Pending labs/ test needing follow-up: Blood cultures  Follow-up Appointments: Follow-up Information    [facility]   Why:  Mon 9/18 1:45 pm Thurs 9/21 1:45 pm Mon 9/25 1:45 pm [address] information: [email or web address]   Why:  rolling walker to be delivered to bedside prior to discharge Contact information: [phone]        [facility] .   Why:  Home health RN, Nurse Aide, SW  arranged Contact information: [address] [place] [phone]          [facility]: Principal Problem:   Cocaine-induced vascular disorder ([facility] Active Problems:   Essential hypertension   Major depression, recurrent (HCC)   Cocaine use disorder, severe, dependence (HCC)   Wound dehiscence   Myalgia and myositis   Tendonitis of ankle   Infectious myositis   1. Wounds She presented with worsening of her multiple, chronic bilateral lower extremity wounds and dehiscence of wound from prior  amputation or right 3rd finger, with several days of fevers/chills and new purulent discharge.  MRI of the R leg did not show osteomyelitis, but findings of abscess, myositis, and tendinitis.  Her worst wound is on the posterior right calf, with exposed fascia and tendon.  She was started on IV vancomycin, blood cultures were collected, and orthopedic surgery consulted.  With her ongoing cocaine use and vasculitis, no surgical options, including R BKA, were offered due to inability to heal.  She never  became septic, and with IV vancomyin she defervesced.  Blood cultures with no growth at 48 hours.  She was discharged with 2 weeks of Bactrim and instructed to loosely wrap wounds in clean, dry dressings with hydrogel, telfa, and kerlex with daily changes, and follow-up in [facility] closely.   Arranged for home health nursing, SW, and PT.  Has rolling walker and cane.  Encouraged abstinence from cocaine as her only chance to survive.  2. Acute Kidney Injury Due to fell ill and pain in her right foot, she did not eat or drink much in the days prior to admission.  Her Creatinine was 1.31 from baseline of ~0.8, and rapidly returned to baseline with IV fluids.    3. Hypokalemia Asymptomatic hypokalemia with hypomagnesemia.  Repleted K and Mg.   4. HTN Started her prescribed home lisinopril, which she had been noncompliant with.  Discharge Vitals:   BP 117/60   Pulse 99   Temp 98.9 F (37.2 C) (Oral)   Resp 16   Ht 5' 1"  (1.549 m) Comment: per 05/18/16 encounter  Wt 98 lb (44.5 kg) Comment: per 05/18/16  SpO2 99%   BMI 18.52 kg/m      Pertinent Labs, Studies, and Procedures:   MRI R Leg and Foot (06/14/16):  IMPRESSION: 1. Soft tissue wound along the distal calf. Soft tissue wound overlying the muscular tendinous junction of the proximal Achilles tendon with increased signal and enhancement along the lateral aspect concerning for tendinosis likely secondary to an infectious etiology. 2. 1 x 3.5 x 2.8 cm peripherally enhancing fluid collection along the posterior-lateral aspect of the ankle most consistent with an abscess. Severe soft tissue edema and enhancement adjacent to the abscess consistent with cellulitis. 3. Severe edema in the distal soleus muscle at the musculotendinous junction and to a lesser extent distal medial and lateral gastrocnemius muscles with enhancement and an overlying soft tissue wound most concerning for infectious myositis. 4. 13 x 13 x 19 mm peripherally  enhancing abnormality in the subcutaneous fat overlying the medial posterior calcaneus which may reflect an area of fat necrosis versus an abscess.  Blood Cultures (06/15/16): no growth at 3 days  Discharge Instructions: Discharge Instructions    Diet - low sodium heart healthy    Complete by:  As directed    Increase activity slowly    Complete by:  As directed       You were admitted to the hospital for worsening and infection of the wounds on your legs and amputated finger.  These wounds are getting worse because of your cocaine use.  We treated you with IV antibiotics, and did not find evidence to suggest that there was infection in your bone or blood at this time.  There are no available surgical options for you while you are using cocaine, because you will not be able to heal from surgery.  We will give you oral antibiotics to take for 2 weeks after leaving the hospital.  Antibiotics alone may  not be able to clear your infection, and you are at risk of dying from this.  Your only chance to heal is to stop using cocaine.  We will see you in clinic on a regular basis to check your wounds and see how you are doing.  A Home Health Nurse, Social Worker, and Physical Therapist will also visit you to work with you.  Keep your wounds wrapped with clean, dry dressings and change them daily.  If you have fevers or chills, or cannot eat and drink because of pain or illness, please call the clinic or go to the ED.  Signed: Minus Liberty, MD 06/19/2016, 1:10 PM   Pager: [phone]

## 2016-06-14 NOTE — ED Provider Notes (Signed)
[facility] DEPT Provider Note   CSN: [id number] Arrival date & time: 06/14/16  1036     History   Chief Complaint Chief Complaint  Patient presents with  . Leg Pain-possible infection  . possible finger amputation infection    HPI [name] is a 55 y.o. female.  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  She was admitted on 8/14-19 for infection in her ulcers.  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  She was seen by IM on 8/24 for follow up and her wounds did not appear to be infected.  The pt said that she has been in bed for 3 days due to the severe pain in her wounds.  She said that she ran out of her dressings and has also not changed dressings for a few days.  The wound to her left 3rd finger from her amputation site has dehisced.      Past Medical History:  Diagnosis Date  . CAP (community acquired pneumonia) 03/2014 X 2  . Cocaine abuse    ongoing with resultant vaculitis.  . Daily headache   . Depression   . Hypertension   . Inflammatory arthritis ([place])   . Migraines    "probably 5-6/yr" (04/21/2014)  . Normocytic anemia    BL Hgb 9.8-12. Last anemia panel 04/2010 - showing Fe 19, ferritin 101.  Pt on monthly B12 injections  . Rheumatoid arthritis(714.0)    patient reported  . 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 Vasculitis ([place])    2/2 Levimasole toxicity. Followed by Dr. Louanne Skye    Patient Active Problem List   Diagnosis Date Noted  . Finger necrosis ([place]) 05/18/2016  . AKI (acute kidney injury) ([place])   . Cellulitis 04/27/2016  . Acute renal failure (ARF) ([place]) 04/27/2016  . Healthcare maintenance 04/15/2016  . Leg ulcer ([place])   . Leg wound, left   . Skin ulcer ([place]) 06/04/2015  . Pressure ulcer 04/22/2015  . Skin necrosis 04/21/2015  . Protein-calorie malnutrition, severe ([place]) 01/15/2015  . Cocaine abuse   . Swelling of finger, right 01/14/2015  . Foot  ulceration ([place]) 01/14/2015  . Severe recurrent major depression without psychotic features ([place]) 10/16/2014  . Cocaine use disorder, severe, dependence ([place]) 10/10/2014  . Cannabis use disorder, severe, dependence ([place]) 10/10/2014  . 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 Substance induced mood disorder ([place]) 10/09/2014  . Major depressive disorder, single episode, severe without psychotic features ([place])   . 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 Vasculitis ([place]) 04/19/2014  . Hepatomegaly 04/11/2014  . Acute pericarditis 03/31/2014  . Major depression, recurrent ([place]) 01/13/2014  . Inflammatory arthritis ([place]) 09/24/2013  . 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 Cocaine dependence ([place]) 05/15/2013  . Tobacco abuse 01/03/2013  . hyperlipidemia 01/03/2013  . Essential hypertension 02/26/2010    Past Surgical History:  Procedure Laterality Date  . AMPUTATION Left 05/22/2016   Procedure: AMPUTATION LEFT LONG FINGER;  Surgeon: Marybelle Killings, MD;  Location: [facility];  Service: Orthopedics;  Laterality: Left;  . HERNIA REPAIR     "stomach"  . I&D EXTREMITY Right 09/26/2015   Procedure: IRRIGATION AND DEBRIDEMENT LEG WOUND  VAC PLACEMENT.;  Surgeon: Loel Lofty Dillingham, DO;  Location: [facility];  Service: Plastics;  Laterality: Right;  . IRRIGATION AND DEBRIDEMENT ABSCESS Bilateral 09/26/2013   Procedure: DEBRIDEMENT ULCERS BILATERAL  THIGHS;  Surgeon: Gwenyth Ober, MD;  Location: [facility];  Service: General;  Laterality: Bilateral;  . SKIN BIOPSY Bilateral 04/2010   shin nodules    OB History    No data available       Home Medications    Prior to Admission medications   Medication Sig Start Date End Date Taking? Authorizing Provider  naproxen sodium (ANAPROX) 220 MG tablet Take 440 mg by mouth every 12 (twelve) hours as needed (pain).   Yes Historical Provider, MD  feeding supplement (BOOST / RESOURCE BREEZE) LIQD Take 1  Container by mouth 3 (three) times daily between meals. Patient not taking: Reported on 06/14/2016 05/01/16   [name], MD  lisinopril (PRINIVIL,ZESTRIL) 10 MG tablet Take 1 tablet (10 mg total) by mouth daily. Patient not taking: Reported on 06/14/2016 05/28/16   Bethany Molt, DO  metroNIDAZOLE (METROGEL) 0.75 % gel Apply 1 application topically every morning. Patient not taking: Reported on 06/14/2016 05/23/16   Riccardo Dubin, MD  oxyCODONE (OXY IR/ROXICODONE) 5 MG immediate release tablet Take 1 tablet (5 mg total) by mouth every 6 (six) hours as needed for severe pain. Patient not taking: Reported on 06/14/2016 05/23/16   Riccardo Dubin, MD  predniSONE (DELTASONE) 20 MG tablet 2 tablet daily with breakfast for 1 week, then 1.5 tablet daily for 1 week, then 1 tablet daily for 1 week, then half tablet for 1 week. Patient not taking: Reported on 05/18/2016 05/01/16   [name], MD  predniSONE (DELTASONE) 20 MG tablet Take 4 tablets x 7 days.Take 3 tablets x 7 days. Take 2 tablets x 7 days. Take 1 x 7 days. Patient not taking: Reported on 06/14/2016 05/23/16   Riccardo Dubin, MD    Family History Family History  Problem Relation Age of Onset  . Breast cancer Mother     Breast cancer  . Alcohol abuse Mother   . Colon cancer Maternal Aunt 23  . Alcohol abuse Father     Social History Social History  Substance Use Topics  . Smoking status: Current Every Day Smoker    Packs/day: 0.30    Years: 34.00    Types: Cigarettes  . Smokeless tobacco: Never Used     Comment: "Trying very hard". / 4-5 CIGARETTES A DAY  . Alcohol use No     Allergies   Acetaminophen   Review of Systems Review of Systems  Musculoskeletal:       Leg pain  Skin: Positive for wound.  All other systems reviewed and are negative.    Physical Exam Updated Vital Signs BP 134/79   Pulse 94   Temp 97.7 F (36.5 C) (Oral)   Resp 15   SpO2 100%   Physical Exam  Constitutional: She appears well-developed.  She appears cachectic. She has a sickly appearance. She appears ill. She appears distressed.  HENT:  Head: Normocephalic and atraumatic.  Right Ear: External ear normal.  Left Ear: External ear normal.  Nose: Nose normal.  Mouth/Throat: Oropharynx is clear and moist.  Eyes: Conjunctivae and EOM are normal. Pupils are equal, round, and reactive to light.  Neck: Normal range of motion. Neck supple.  Cardiovascular: Regular rhythm.  Tachycardia present.   Pulmonary/Chest: Effort normal and breath sounds normal.  Abdominal: Soft. Bowel sounds are normal.  Musculoskeletal:  Left 3rd finger amputation site:  Dehisced, but not draining.  Left ankle:  Wound to medial ankle with purulent, foul smelling drainage  Right leg:  Multiple  wounds to right lower leg.  Skin is gone and fascia is visible.  Purulent, foul smelling drainage.  Right medial foot:  Ulcer with drainage  Nursing note and vitals reviewed.    ED Treatments / Results  Labs (all labs ordered are listed, but only abnormal results are displayed) Labs Reviewed  COMPREHENSIVE METABOLIC PANEL - Abnormal; Notable for the following:       Result Value   Glucose, Bld 130 (*)    BUN 27 (*)    Creatinine, Ser 1.31 (*)    ALT 8 (*)    GFR calc non Af Amer 46 (*)    GFR calc Af Amer 53 (*)    All other components within normal limits  CBC WITH DIFFERENTIAL/PLATELET - Abnormal; Notable for the following:    WBC 13.5 (*)    RDW 15.7 (*)    Neutro Abs 11.4 (*)    All other components within normal limits  URINALYSIS, ROUTINE W REFLEX MICROSCOPIC (NOT AT [facility])  URINE RAPID DRUG SCREEN, HOSP PERFORMED  I-STAT CG4 LACTIC ACID, ED  I-STAT CG4 LACTIC ACID, ED    EKG  EKG Interpretation None       Radiology Dg Tibia/fibula Right  Result Date: 06/14/2016 CLINICAL DATA:  Right calf pain and open wounds. EXAM: RIGHT TIBIA AND FIBULA - 2 VIEW COMPARISON:  Radiographs of September 25, 2015. FINDINGS: There is no evidence of  fracture or other focal bone lesions. Irregularity of posterior soft tissues of lower calf is noted consistent with infection or injury. No radiopaque foreign body is noted. IMPRESSION: No fracture or dislocation is noted. Irregularity of posterior soft tissues of lower calf consistent with infection or injury. Electronically Signed   By: Marijo Conception, M.D.   On: 06/14/2016 13:07   Dg Ankle Complete Left  Result Date: 06/14/2016 CLINICAL DATA:  Left ankle pain without known injury. EXAM: LEFT ANKLE COMPLETE - 3+ VIEW COMPARISON:  Radiographs of March 09, 2015. FINDINGS: There is no evidence of fracture, dislocation, or joint effusion. There is no evidence of arthropathy or other focal bone abnormality. Soft tissue gas is noted posteriorly consistent with infection. IMPRESSION: No definite osseous abnormality is noted. Soft tissue gas is seen posterior to distal tibia concerning for infection. Electronically Signed   By: Marijo Conception, M.D.   On: 06/14/2016 13:17   Dg Hand Complete Left  Result Date: 06/14/2016 CLINICAL DATA:  Pain to left middle finger amputation site. EXAM: LEFT HAND - COMPLETE 3+ VIEW COMPARISON:  Radiographs of May 19, 2016. FINDINGS: Status post surgical amputation of most of the third proximal phalanx and more distal phalanges. Stable chronic erosive changes are seen involving the proximal interphalangeal joint of the fourth finger. No acute fracture or dislocation is noted. IMPRESSION: Status post surgical amputation of phalanges of middle finger. Stable chronic erosive changes involving proximal interphalangeal joint of fourth finger. Electronically Signed   By: Marijo Conception, M.D.   On: 06/14/2016 13:14   Dg Foot Complete Right  Result Date: 06/14/2016 CLINICAL DATA:  Right foot pain in wounds. EXAM: RIGHT FOOT COMPLETE - 3+ VIEW COMPARISON:  None. FINDINGS: No definite fracture is noted. Severe hallux valgus deformity of first metatarsophalangeal joint is noted with  subluxation laterally of the first proximal phalanx relative to first metatarsal. There appears to be an open wound overlying the distal portion of the first metatarsal with some lucency in the bone in this area suggesting possible osteomyelitis. No radiopaque foreign body  is noted. IMPRESSION: Severe hallux valgus deformity of first metatarsophalangeal joint with subluxation laterally of first proximal phalanx relative to first metatarsal. Open wound is seen overlying the distal portion of the first metatarsal with some lucency seen in the bone in this area suggesting possible osteomyelitis. MRI may be performed further evaluation. Electronically Signed   By: Marijo Conception, M.D.   On: 06/14/2016 13:11    Procedures Procedures (including critical care time)  Medications Ordered in ED Medications  vancomycin (VANCOCIN) IVPB 750 mg/150 ml premix (not administered)  sodium chloride 0.9 % bolus 1,000 mL (0 mLs Intravenous Stopped 06/14/16 1404)  morphine 4 MG/ML injection 4 mg (4 mg Intravenous Given 06/14/16 1147)  ondansetron (ZOFRAN) injection 4 mg (4 mg Intravenous Given 06/14/16 1147)  vancomycin (VANCOCIN) IVPB 1000 mg/200 mL premix (0 mg Intravenous Stopped 06/14/16 1402)     Initial Impression / Assessment and Plan / ED Course  I have reviewed the triage vital signs and the nursing notes.  Pertinent labs & imaging results that were available during my care of the patient were reviewed by me and considered in my medical decision making (see chart for details).  Clinical Course  Pt may have osteomyelitis, so she was given appropriate vancomycin iv dosing per pharmacy.  Pt is a patient of the IM teaching service and will be transferred to [facility] for treatment.  Accepting physician is Dr. Evette Doffing.   Final Clinical Impressions(s) / ED Diagnoses   Final diagnoses:  Vasculitis ([place])  Cocaine abuse  Wound dehiscence, initial encounter  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, right [facility])     New Prescriptions New Prescriptions   No medications on file     Isla Pence, MD 06/14/16 1425

## 2016-06-14 NOTE — ED Triage Notes (Signed)
Patient has wound on left leg that is causing pain and has drainage with foul odor. Patient had left meddle finger amputated couple weeks ago, patient states that stitches are spilt open and painful.  Patient has dirty dressing on amputated finger and left leg.  Patient is tearful in triage.

## 2016-06-14 NOTE — Progress Notes (Signed)
Pharmacy Antibiotic Note  [name] is a 55 y.o. female with hx levamisole/cocaine induced vasculitis and chronic leg wounds who was hospitalized in August 2018 for management of wound and had amputation of her left 3rd finger on 8/18 d/t necrosis.  Wound culture with that admission was positive for MRSA and proteus.  She presented to the ED on 9/10 with c/o left leg wound with drainage.  To start vancomycin for wound infection and r/o leg osteomyelitis.  - wbc 13.5, scr 1.31 (crcl~35), LA 1.70  Plan: - vancomycin 1000 mg IV x1, then 750 mg q24h - monitor renal function ______________________________    Temp (24hrs), Avg:97.7 F (36.5 C), Min:97.7 F (36.5 C), Max:97.7 F (36.5 C)  No results for input(s): WBC, CREATININE, LATICACIDVEN, VANCOTROUGH, VANCOPEAK, VANCORANDOM, GENTTROUGH, GENTPEAK, GENTRANDOM, TOBRATROUGH, TOBRAPEAK, TOBRARND, AMIKACINPEAK, AMIKACINTROU, AMIKACIN in the last 168 hours.  CrCl cannot be calculated (Unknown ideal weight.).    Allergies  Allergen Reactions  . Acetaminophen Swelling and Other (See Comments)    Reaction:  Eyelid swelling     Thank you for allowing pharmacy to be a part of this patient's care.  Lynelle Doctor 06/14/2016 11:35 AM

## 2016-06-14 NOTE — ED Notes (Signed)
MD at bedside. 

## 2016-06-14 NOTE — H&P (Signed)
Date: 06/14/2016               Patient Name:  [name] MRN: [id number]  DOB: April 19, 1961 Age / Sex: 55 y.o., female   PCP: Zada Finders, MD         Medical Service: Internal Medicine Teaching Service         Attending Physician: Dr. Axel Filler, MD    First Contact: Dr. Inda Castle Pager: [phone]  Second Contact: Dr. Quay Burow Pager: [phone]       After Hours (After 5p/  First Contact Pager: [phone]  weekends / holidays): Second Contact Pager: [phone]   Chief Complaint: Wounds  History of Present Illness: Ms Hulet is a 55 year old woman with history of cocaine abuse and levimasole vasculitis who presents with multiple malodorous wounds with purulent discharge.  She has had multiple prior admission due to levimasole vasculitis and associated wounds.  She was admitted 7/24-7/28/2017 for necrotic ulcers of her fingers and lower extremities, initially treated with IV steroids and ceftriaxone, and then PO doxycycline and prednisone. She was then readmitted 8/14-8/17/2017 and her left 3rd finger was amputated at at when found to be dislocated and necrotic.  She was seen in clinic on 8/24, and her amputated finger seemed to be healing well, with no purulent drainage at that site or her bilateral leg wounds.    Since she was seen in clinic 2.5 weeks ago, her wounds become deeper, purulent, and painful.  Her wound from her finger amputation dehisced and started exuded pus since last week.  For the past 4 days, she's been confined to bed due to pain from her swollen, red right food.  She had had subjective fevers and chills.  In the ED, she was started on vancomycin for empiric treatment of her cutaneous infection and received 1L NS.  Last used cocaine 3-4 days ago.  Meds:  Current Meds  Medication Sig  . naproxen sodium (ANAPROX) 220 MG tablet Take 440 mg by mouth every 12 (twelve) hours as needed (pain).     Allergies: Allergies as of 06/14/2016 - Review Complete  06/14/2016  Allergen Reaction Noted  . Acetaminophen Swelling and Other (See Comments)    Past Medical History:  Diagnosis Date  . CAP (community acquired pneumonia) 03/2014 X 2  . Cocaine abuse    ongoing with resultant vaculitis.  . Daily headache   . Depression   . Hypertension   . Inflammatory arthritis ([place])   . Migraines    "probably 5-6/yr" (04/21/2014)  . Normocytic anemia    BL Hgb 9.8-12. Last anemia panel 04/2010 - showing Fe 19, ferritin 101.  Pt on monthly B12 injections  . Rheumatoid arthritis(714.0)    patient reported  . 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 Vasculitis ([place])    2/2 Levimasole toxicity. Followed by Dr. Louanne Skye   Family History  Problem Relation Age of Onset  . Breast cancer Mother     Breast cancer  . Alcohol abuse Mother   . Colon cancer Maternal Aunt 68  . Alcohol abuse Father    Social History: Current cocaine and tobacco (3-4 cigarettes daily) use.  No alcohol, other drugs.  Review of Systems: A complete ROS was negative except as per HPI.  Physical Exam: Blood pressure 130/77, pulse 116, temperature 97.7 F (36.5 C), temperature source Oral, resp. rate 17, SpO2 100 %.  Physical Exam  Constitutional: She is oriented  to person, place, and time.  Tearful, cachectic woman lying awkwardly in bed  Eyes: Conjunctivae are normal. No scleral icterus.  Neck: Normal range of motion. Neck supple.  Cardiovascular: Normal rate and regular rhythm.   Pulmonary/Chest: Effort normal and breath sounds normal.  Abdominal: Soft. She exhibits no distension. There is no tenderness.  Neurological: She is alert and oriented to person, place, and time.  Skin:  See included photos below  Multiple malodorous wounds, purulent, with exposed tendon, fascia, and subcutaneous fat covering approximately half of posterior R leg.   Smaller wounds of similar character on medial R foot over head of 1st metatarsal and on medial L  ankle  L 3rd finger s/p amputation with wound dehisence and purulent discharge  Numerous hypopigmented scars with hyperpigmented margins  Psychiatric:  Tearful, cooperative             CBC Latest Ref Rng & Units 06/14/2016 05/22/2016 05/18/2016  WBC 4.0 - 10.5 K/uL 13.5(H) 10.7(H) 13.6(H)  Hemoglobin 12.0 - 15.0 g/dL 12.5 10.4(L) 13.1  Hematocrit 36.0 - 46.0 % 37.8 32.7(L) 39.9  Platelets 150 - 400 K/uL 376 246 285   CMP Latest Ref Rng & Units 06/14/2016 05/23/2016 05/22/2016  Glucose 65 - 99 mg/dL 130(H) 98 87  BUN 6 - 20 mg/dL 27(H) 23(H) 20  Creatinine 0.44 - 1.00 mg/dL 1.31(H) 0.95 0.84  Sodium 135 - 145 mmol/L 138 139 140  Potassium 3.5 - 5.1 mmol/L 3.9 4.2 3.7  Chloride 101 - 111 mmol/L 102 105 107  CO2 22 - 32 mmol/L 24 28 25   Calcium 8.9 - 10.3 mg/dL 9.1 8.8(L) 8.8(L)  Total Protein 6.5 - 8.1 g/dL 7.8 - -  Total Bilirubin 0.3 - 1.2 mg/dL 1.1 - -  Alkaline Phos 38 - 126 U/L 93 - -  AST 15 - 41 U/L 15 - -  ALT 14 - 54 U/L 8(L) - -   Lactic Acid, Venous    Component Value Date/Time   LATICACIDVEN 1.70 06/14/2016 1150   Radiographs (06/14/2016) R foot: possible osteomyelitis R leg: no osteo L hand: no osteo L ankle: no signs of osteo  Assessment & Plan by Problem: Principal Problem:   Cellulitis Active Problems:   Essential hypertension   Cocaine-induced vascular disorder (HCC)   Major depression, recurrent (HCC)   Cocaine use disorder, severe, dependence (HCC)  #Levimasole Vasculitis #Leg Ulcers #Cellulitis Deep, purulent wounds concerning for bacterial superinfection of her chronic vasculitic wounds.  Subjective fever/chills, has leukocytosis here but afebrile.  Plain films with bony lucency concerning for osteomyelitis on R foot.  Continued cocaine use, smoking and malnutrition all likely contributing to the chronicity and recurrence of her ulcers. -Continue vancomycin for purulent SST infection in patient with extensive hospital exposure -MRI R foot  and leg for osteomyelitis -[place] plastic surgery  #Acute Kidney Injury Baseline Cr  ~0.8, 1.31 today with BUN/Cr of 21 and reported reduced PO intake, likely representing prerenal etiology of AKI.  Received 1L NS in ED. -mIVF -Repeat BMP  #HTN -Hold home lisinopril with AKI  Dispo: Admit patient to Inpatient with expected length of stay greater than 2 midnights.  Signed: Minus Liberty, MD 06/14/2016, 12:36 PM  Pager: [phone]

## 2016-06-15 DIAGNOSIS — L02415 Cutaneous abscess of right lower limb: Secondary | ICD-10-CM

## 2016-06-15 DIAGNOSIS — B9689 Other specified bacterial agents as the cause of diseases classified elsewhere: Secondary | ICD-10-CM

## 2016-06-15 DIAGNOSIS — IMO0001 Reserved for inherently not codable concepts without codable children: Secondary | ICD-10-CM | POA: Diagnosis present

## 2016-06-15 LAB — RAPID URINE DRUG SCREEN, HOSP PERFORMED
Amphetamines: NOT DETECTED
BENZODIAZEPINES: NOT DETECTED
Barbiturates: NOT DETECTED
COCAINE: POSITIVE — AB
Opiates: POSITIVE — AB
Tetrahydrocannabinol: NOT DETECTED

## 2016-06-15 LAB — BASIC METABOLIC PANEL
Anion gap: 8 (ref 5–15)
BUN: 20 mg/dL (ref 6–20)
CHLORIDE: 108 mmol/L (ref 101–111)
CO2: 24 mmol/L (ref 22–32)
Calcium: 8.3 mg/dL — ABNORMAL LOW (ref 8.9–10.3)
Creatinine, Ser: 0.98 mg/dL (ref 0.44–1.00)
GFR calc Af Amer: >60 mL/min (ref >60)
Glucose, Bld: 109 mg/dL — ABNORMAL HIGH (ref 65–99)
POTASSIUM: 3.8 mmol/L (ref 3.5–5.1)
SODIUM: 140 mmol/L (ref 135–145)

## 2016-06-15 LAB — CK: Total CK: 20 U/L — ABNORMAL LOW (ref 38–234)

## 2016-06-15 LAB — CBC
HEMATOCRIT: 30 % — AB (ref 36.0–46.0)
HEMOGLOBIN: 9.7 g/dL — AB (ref 12.0–15.0)
MCH: 28.7 pg (ref 26.0–34.0)
MCHC: 32.3 g/dL (ref 30.0–36.0)
MCV: 88.8 fL (ref 78.0–100.0)
Platelets: 266 10*3/uL (ref 150–400)
RBC: 3.38 MIL/uL — AB (ref 3.87–5.11)
RDW: 15.7 % — ABNORMAL HIGH (ref 11.5–15.5)
WBC: 6.4 10*3/uL (ref 4.0–10.5)

## 2016-06-15 MED ORDER — ASPIRIN EC 325 MG PO TBEC
325.0000 mg | DELAYED_RELEASE_TABLET | Freq: Once | ORAL | Status: AC | PRN
  Administered 2016-06-15: 325 mg via ORAL
  Filled 2016-06-15: qty 1

## 2016-06-15 NOTE — Progress Notes (Addendum)
   Subjective: No acute events overnight, lying in bed comfortably.  Denies fevers and chills.  Objective:  Vital signs in last 24 hours: Vitals:   06/14/16 1043 06/14/16 1313 06/14/16 1413 06/14/16 2151  BP: 130/77 166/79 134/79 132/68  Pulse: 116 91 94 91  Resp: 17 17 15 18   Temp: 97.7 F (36.5 C)   98.8 F (37.1 C)  TempSrc: Oral   Oral  SpO2: 100% 100% 100% 100%   Physical Exam  Constitutional:  Cachetic, chronically ill appearing, with clean bandages on hand and legs  Cardiovascular: Normal rate and regular rhythm.   Pulmonary/Chest: Effort normal and breath sounds normal.  Musculoskeletal:  Clean bandage over absent L 3rd finger  Skin:  Clean dressings over leg wounds  Psychiatric: She has a normal mood and affect. Her behavior is normal.   CBC Latest Ref Rng & Units 06/15/2016 06/14/2016 05/22/2016  WBC 4.0 - 10.5 K/uL 6.4 13.5(H) 10.7(H)  Hemoglobin 12.0 - 15.0 g/dL 9.7(L) 12.5 10.4(L)  Hematocrit 36.0 - 46.0 % 30.0(L) 37.8 32.7(L)  Platelets 150 - 400 K/uL 266 376 246   BMP Latest Ref Rng & Units 06/15/2016 06/14/2016 05/23/2016  Glucose 65 - 99 mg/dL 109(H) 130(H) 98  BUN 6 - 20 mg/dL 20 27(H) 23(H)  Creatinine 0.44 - 1.00 mg/dL 0.98 1.31(H) 0.95  BUN/Creat Ratio 9 - 23 - - -  Sodium 135 - 145 mmol/L 140 138 139  Potassium 3.5 - 5.1 mmol/L 3.8 3.9 4.2  Chloride 101 - 111 mmol/L 108 102 105  CO2 22 - 32 mmol/L 24 24 28   Calcium 8.9 - 10.3 mg/dL 8.3(L) 9.1 8.8(L)    MRI R Leg and Foot (06/14/16): No signs of osteo.  Soft tissue findings consistent with myositis, tendinosis, cellulitis, abscess  Assessment/Plan:  Principal Problem:   Cellulitis Active Problems:   Essential hypertension   Cocaine-induced vascular disorder (HCC)   Major depression, recurrent (HCC)   Cocaine use disorder, severe, dependence ([place])   Wound dehiscence  #Levimasole Vasculitis #Chronic Wounds Given the depth and extent of the wound on her R leg, concern the limb may not be  salvagable.  Afebrile, leukocytosis on admission is resolving on vancomycin. -Continue IV vancomycin pending surgical recommendations -Consult [name]  #Acute Kidney Injury Cr falling with IVF -mIVF  #HTN Not taking prescribed lisinopril at home.  Will not start currently with AKI, normotensive BPs.  Dispo: Anticipated discharge in approximately 3 day(s).   Minus Liberty, MD 06/15/2016, 9:58 AM Pager: [phone]

## 2016-06-16 DIAGNOSIS — E876 Hypokalemia: Secondary | ICD-10-CM

## 2016-06-16 LAB — BASIC METABOLIC PANEL
ANION GAP: 10 (ref 5–15)
Anion gap: 9 (ref 5–15)
BUN: 8 mg/dL (ref 6–20)
BUN: 9 mg/dL (ref 6–20)
CHLORIDE: 104 mmol/L (ref 101–111)
CO2: 25 mmol/L (ref 22–32)
CO2: 24 mmol/L (ref 22–32)
Calcium: 8.6 mg/dL — ABNORMAL LOW (ref 8.9–10.3)
Calcium: 8.4 mg/dL — ABNORMAL LOW (ref 8.9–10.3)
Chloride: 103 mmol/L (ref 101–111)
Creatinine, Ser: 0.78 mg/dL (ref 0.44–1.00)
Creatinine, Ser: 1.04 mg/dL — ABNORMAL HIGH (ref 0.44–1.00)
GFR calc Af Amer: >60 mL/min (ref >60)
GFR calc non Af Amer: >60 mL/min (ref >60)
Glucose, Bld: 144 mg/dL — ABNORMAL HIGH (ref 65–99)
Glucose, Bld: 141 mg/dL — ABNORMAL HIGH (ref 65–99)
POTASSIUM: 2.8 mmol/L — AB (ref 3.5–5.1)
POTASSIUM: 2.9 mmol/L — AB (ref 3.5–5.1)
SODIUM: 138 mmol/L (ref 135–145)
SODIUM: 137 mmol/L (ref 135–145)

## 2016-06-16 LAB — CBC WITH DIFFERENTIAL/PLATELET
BASOS PCT: 0 %
Basophils Absolute: 0 10*3/uL (ref 0.0–0.1)
EOS ABS: 0 10*3/uL (ref 0.0–0.7)
Eosinophils Relative: 0 %
HCT: 31.5 % — ABNORMAL LOW (ref 36.0–46.0)
HEMOGLOBIN: 10.5 g/dL — AB (ref 12.0–15.0)
Lymphocytes Relative: 22 %
Lymphs Abs: 1.3 10*3/uL (ref 0.7–4.0)
MCH: 28.7 pg (ref 26.0–34.0)
MCHC: 33.3 g/dL (ref 30.0–36.0)
MCV: 86.1 fL (ref 78.0–100.0)
Monocytes Absolute: 0.4 10*3/uL (ref 0.1–1.0)
Monocytes Relative: 6 %
NEUTROS PCT: 72 %
Neutro Abs: 4.3 10*3/uL (ref 1.7–7.7)
Platelets: 271 10*3/uL (ref 150–400)
RBC: 3.66 MIL/uL — AB (ref 3.87–5.11)
RDW: 15.1 % (ref 11.5–15.5)
WBC: 6.1 10*3/uL (ref 4.0–10.5)

## 2016-06-16 LAB — MAGNESIUM: MAGNESIUM: 1.6 mg/dL — AB (ref 1.7–2.4)

## 2016-06-16 MED ORDER — BOOST / RESOURCE BREEZE PO LIQD
1.0000 | Freq: Three times a day (TID) | ORAL | Status: DC
  Administered 2016-06-16 – 2016-06-19 (×10): 1 via ORAL
  Filled 2016-06-16 (×2): qty 1

## 2016-06-16 MED ORDER — IBUPROFEN 400 MG PO TABS
400.0000 mg | ORAL_TABLET | Freq: Four times a day (QID) | ORAL | Status: DC | PRN
  Administered 2016-06-16: 400 mg via ORAL
  Filled 2016-06-16: qty 1

## 2016-06-16 MED ORDER — LISINOPRIL 10 MG PO TABS
10.0000 mg | ORAL_TABLET | Freq: Every day | ORAL | Status: DC
  Administered 2016-06-16 – 2016-06-19 (×4): 10 mg via ORAL
  Filled 2016-06-16 (×4): qty 1

## 2016-06-16 MED ORDER — MAGNESIUM SULFATE IN D5W 1-5 GM/100ML-% IV SOLN
1.0000 g | Freq: Once | INTRAVENOUS | Status: AC
  Administered 2016-06-16: 1 g via INTRAVENOUS
  Filled 2016-06-16: qty 100

## 2016-06-16 MED ORDER — POTASSIUM CHLORIDE CRYS ER 20 MEQ PO TBCR
40.0000 meq | EXTENDED_RELEASE_TABLET | Freq: Two times a day (BID) | ORAL | Status: DC
  Administered 2016-06-16 – 2016-06-18 (×6): 40 meq via ORAL
  Filled 2016-06-16 (×7): qty 2

## 2016-06-16 MED ORDER — POTASSIUM CHLORIDE 10 MEQ/100ML IV SOLN
10.0000 meq | INTRAVENOUS | Status: AC
  Administered 2016-06-16 (×3): 10 meq via INTRAVENOUS
  Filled 2016-06-16 (×3): qty 100

## 2016-06-16 NOTE — Progress Notes (Signed)
   Subjective: Febrile yesterday and overnight, with Tmax 102.4, with occasional subjective chills.  Pain well controlled.  Objective:  Vital signs in last 24 hours: Vitals:   06/15/16 1404 06/15/16 2131 06/16/16 0559 06/16/16 0601  BP: (!) 165/76 (!) 166/81 (!) 162/76 (!) 145/73  Pulse: (!) 112 (!) 118 (!) 106 (!) 105  Resp: 18 18    Temp: (!) 101.9 F (38.8 C) (!) 102.4 F (39.1 C) 99.5 F (37.5 C)   TempSrc: Oral Oral Oral   SpO2: 96% 98% 100% 100%   Physical Exam  Constitutional: She is oriented to person, place, and time.  Cachetic, chronically ill appearing  Cardiovascular: Normal rate and regular rhythm.   Pulmonary/Chest: Effort normal and breath sounds normal.  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  Skin:  Clean dressings over leg wounds  Psychiatric: She has a normal mood and affect. Her behavior is normal.   CBC Latest Ref Rng & Units 06/16/2016 06/15/2016 06/14/2016  WBC 4.0 - 10.5 K/uL 6.1 6.4 13.5(H)  Hemoglobin 12.0 - 15.0 g/dL 10.5(L) 9.7(L) 12.5  Hematocrit 36.0 - 46.0 % 31.5(L) 30.0(L) 37.8  Platelets 150 - 400 K/uL 271 266 376   BMP Latest Ref Rng & Units 06/16/2016 06/15/2016 06/14/2016  Glucose 65 - 99 mg/dL 144(H) 109(H) 130(H)  BUN 6 - 20 mg/dL 8 20 27(H)  Creatinine 0.44 - 1.00 mg/dL 0.78 0.98 1.31(H)  BUN/Creat Ratio 9 - 23 - - -  Sodium 135 - 145 mmol/L 138 140 138  Potassium 3.5 - 5.1 mmol/L 2.8(L) 3.8 3.9  Chloride 101 - 111 mmol/L 104 108 102  CO2 22 - 32 mmol/L 25 24 24   Calcium 8.9 - 10.3 mg/dL 8.6(L) 8.3(L) 9.1    MRI R Leg and Foot (06/14/16): No signs of osteo.  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  She was febrile overnight, but given her antibiotics were started only 1 day ago this does not represented a treatment failure.  Due to her ongoing cocaine abuse, vasculitis, and impaired wound healing, she is not a surgical candidate for amputation per orthopedic surgeon Dr [name].  Her prognosis is poor with her vasculitis, poor social situation, and limited options for medical or surgical management of her infected chronic wounds. -Continue IV vancomycin -Follow-up blood cultures  #Hypokalemia K 2.8 today, down from 3.8 yesterday. -Replete -Repeat BMP -Check Mg  #Acute Kidney Injury Resolved with IVF, consistent with prerenal etiology.  #HTN Not taking prescribed lisinopril at home.  Will not start currently with AKI, normotensive BPs.  Dispo: Anticipated discharge in approximately 3 day(s).   Minus Liberty, MD 06/16/2016, 1:08 PM Pager: [phone]

## 2016-06-16 NOTE — Care Management Note (Signed)
Case Management Note  Patient Details  Name: [name] MRN: [id number] Date of Birth: [birth year]  Subjective/Objective:        Admitted with cocaine induced vascular disorder from boarding house in [place]. Independent with ADL's PTA, no DME usage.            Action/Plan: Plan is to d/c to home today with home health services. Pt declines SNF per PT's recommendation.  Expected Discharge Date:    06/19/2016              Expected Discharge Plan:  Home/Self Care  In-House Referral:  Clinical Social Work (Lives in boarding home)  Discharge planning Services  CM Consult  Post Acute Care Choice:    Choice offered to:     DME Arranged:    DME Agency:     [place] Arranged:   RN,Nurse Aide,SW/ referral made with Butch Penny @ [phone] [name] Agency:   [place]  Status of Service:  In process, will continue to follow  If discussed at Long Length of Stay Meetings, dates discussed:    Additional Comments:  Sharin Mons, RN 06/16/2016, 4:36 PM

## 2016-06-16 NOTE — Progress Notes (Signed)
[name], female   DOB: 06-02-1961, 55 y.o.   MRN: [id number] Talked with Dr. Damita Dunnings Attending about patient's problem with chronic cocaine use and vasculitis .  I have seen her several times with gangrenous skin changes and she now has exposed tendons . Unfortunately no surgery is likely to be successful due to Levamisole vasculitis. She had a healing finger amputation until recurrent use again and the finger has had dehiscence . If she undergoes a LE amputation she will have similar problem with a non healing LE stump which will split open. I have discussed with the patient multiple times as well as with her family that  surgery will not likely be successful with continued expose to chronic Cocaine with Levamisole vasculitis. Unfortunately I have nothing that will fix her leg or finger since she has not change her  Multi-year drug addiction.I had discussed with her before finger surgery that the finger would split open if she did cocaine again.  Her drug screen on this admission remains positive. Recommend she do dressing changes at home which she has done in the past. DEA literature states lists 69% of [place] cocaine has levamisole present. You could get another surgical opinion if you disagree.  My cell [phone]

## 2016-06-16 NOTE — Progress Notes (Signed)
Pt had a fever of 102.4. MD notified. Aspirin was ordered as pt is allergic to Tylenol. Aspirin given, Pt resting soundly. Reassessment of temp showed 99.69f Pt doing well. Will continue to monitor.

## 2016-06-16 NOTE — Progress Notes (Signed)
Initial Nutrition Assessment  DOCUMENTATION CODES:   Severe malnutrition in context of social or environmental circumstances  INTERVENTION:   Will order Boost Breeze supplement TID. Each oral nutrition supplement provides 250 kcal and 9 grams of protein.  NUTRITION DIAGNOSIS:   Malnutrition related to social / environmental circumstances (substance abuse) as evidenced by severe depletion of body fat, severe depletion of muscle mass.  GOAL:   Patient will meet greater than or equal to 90% of their needs  MONITOR:   PO intake, Supplement acceptance, Weight trends, Skin  REASON FOR ASSESSMENT:   Malnutrition Screening Tool   ASSESSMENT:   55 yo female with history of cocaine abuse and levimasole vasculitis who presents with multiple malodorous wounds with purulent discharge  Spoke with pt at bedside who reports good appetite at the hospital and PTA, however, limited access to food suspected due to pt's vague response in regards to how often she eats. Pt has been consuming 75% of meals during current hospitalization. Pt reports recent weight loss but is unable to provide more detailed information.  Pt reveals lack of teeth and the development of a mouth sore. Pt declines any intervention related to these issues.  Spoke with pt about adding an oral nutrition supplement. Pt agreeable and states that her preference is the [employer]. Will order TID.  Labs: low potassium, low calcium Meds: potassium chloride IV and tablet, heparin injection NFPE: Exam performed. Severe muscle depletion, severe fat depletion, and no edema noted.  Diet Order:  Diet regular Room service appropriate? Yes; Fluid consistency: Thin  Skin:  Wound (see comment) (per doc flowsheets)  Last BM:  PTA  Height:   Ht Readings from Last 1 Encounters:  06/16/16 5' 1"  (1.549 m)   Weight:   Wt Readings from Last 1 Encounters:  06/16/16 98 lb (44.5 kg)   Ideal Body Weight:  47.7  kg  BMI:  Body mass index is 18.52 kg/m.  Estimated Nutritional Needs:   Kcal:  1400-1600  Protein:  70-85 grams  Fluid:  1.4-1.6 L  EDUCATION NEEDS:   No education needs identified at this time  Jeb Levering Dietetic Intern Pager Number: [phone]

## 2016-06-16 NOTE — Consult Note (Signed)
Reason for Consult:left long finger amputation Referring Physician: Evette Doffing MD  [name] is an 55 y.o. female.  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 She states since discharge she has not been using but her medical notes states used cocaine 3 to 4 days ago.   Past Medical History:  Diagnosis Date  . CAP (community acquired pneumonia) 03/2014 X 2  . Cocaine abuse    ongoing with resultant vaculitis.  . Daily headache   . Depression   . Hypertension   . Inflammatory arthritis ([place])   . Migraines    "probably 5-6/yr" (04/21/2014)  . Normocytic anemia    BL Hgb 9.8-12. Last anemia panel 04/2010 - showing Fe 19, ferritin 101.  Pt on monthly B12 injections  . Rheumatoid arthritis(714.0)    patient reported  . 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 Vasculitis ([place])    2/2 Levimasole toxicity. Followed by Dr. Louanne Skye    Past Surgical History:  Procedure Laterality Date  . AMPUTATION Left 05/22/2016   Procedure: AMPUTATION LEFT LONG FINGER;  Surgeon: Marybelle Killings, MD;  Location: [facility];  Service: Orthopedics;  Laterality: Left;  . HERNIA REPAIR     "stomach"  . I&D EXTREMITY Right 09/26/2015   Procedure: IRRIGATION AND DEBRIDEMENT LEG WOUND  VAC PLACEMENT.;  Surgeon: Loel Lofty Dillingham, DO;  Location: [facility];  Service: Plastics;  Laterality: Right;  . IRRIGATION AND DEBRIDEMENT ABSCESS Bilateral 09/26/2013   Procedure: DEBRIDEMENT ULCERS BILATERAL THIGHS;  Surgeon: Gwenyth Ober, MD;  Location: [facility];  Service: General;  Laterality: Bilateral;  . SKIN BIOPSY Bilateral 04/2010   shin nodules    Family History  Problem Relation Age of Onset  . Breast cancer Mother     Breast cancer  . Alcohol abuse Mother   . Colon cancer Maternal Aunt 55  . Alcohol abuse Father     Social History:  reports that she has been smoking Cigarettes.  She has a 10.20 pack-year smoking history. She has never used  smokeless tobacco. She reports that she uses drugs, including "Crack" cocaine and Cocaine. She reports that she does not drink alcohol.  Allergies:  Allergies  Allergen Reactions  . Acetaminophen Swelling and Other (See Comments)    Reaction:  Eyelid swelling    Medications: medications reviewed.   Results for orders placed or performed during the hospital encounter of 06/14/16 (from the past 48 hour(s))  Comprehensive metabolic panel     Status: Abnormal   Collection Time: 06/14/16 11:45 AM  Result Value Ref Range   Sodium 138 135 - 145 mmol/L   Potassium 3.9 3.5 - 5.1 mmol/L   Chloride 102 101 - 111 mmol/L   CO2 24 22 - 32 mmol/L   Glucose, Bld 130 (H) 65 - 99 mg/dL   BUN 27 (H) 6 - 20 mg/dL   Creatinine, Ser 1.31 (H) 0.44 - 1.00 mg/dL   Calcium 9.1 8.9 - 10.3 mg/dL   Total Protein 7.8 6.5 - 8.1 g/dL   Albumin 3.5 3.5 - 5.0 g/dL   AST 15 15 - 41 U/L   ALT 8 (L) 14 - 54 U/L   Alkaline Phosphatase 93 38 - 126 U/L   Total Bilirubin 1.1 0.3 - 1.2 mg/dL   GFR calc non Af Amer 46 (L) >60 mL/min   GFR calc Af Amer 53 (L) >60 mL/min    Comment: (NOTE) The eGFR  has been calculated using the CKD EPI equation. This calculation has not been validated in all clinical situations. eGFR's persistently <60 mL/min signify possible Chronic Kidney Disease.    Anion gap 12 5 - 15  CBC with Differential     Status: Abnormal   Collection Time: 06/14/16 11:45 AM  Result Value Ref Range   WBC 13.5 (H) 4.0 - 10.5 K/uL   RBC 4.32 3.87 - 5.11 MIL/uL   Hemoglobin 12.5 12.0 - 15.0 g/dL   HCT 37.8 36.0 - 46.0 %   MCV 87.5 78.0 - 100.0 fL   MCH 28.9 26.0 - 34.0 pg   MCHC 33.1 30.0 - 36.0 g/dL   RDW 15.7 (H) 11.5 - 15.5 %   Platelets 376 150 - 400 K/uL   Neutrophils Relative % 85 %   Neutro Abs 11.4 (H) 1.7 - 7.7 K/uL   Lymphocytes Relative 10 %   Lymphs Abs 1.3 0.7 - 4.0 K/uL   Monocytes Relative 5 %   Monocytes Absolute 0.7 0.1 - 1.0 K/uL   Eosinophils Relative 0 %   Eosinophils Absolute  0.0 0.0 - 0.7 K/uL   Basophils Relative 0 %   Basophils Absolute 0.0 0.0 - 0.1 K/uL  I-Stat CG4 Lactic Acid, ED     Status: None   Collection Time: 06/14/16 11:50 AM  Result Value Ref Range   Lactic Acid, Venous 1.70 0.5 - 1.9 mmol/L  Basic metabolic panel     Status: Abnormal   Collection Time: 06/15/16  4:26 AM  Result Value Ref Range   Sodium 140 135 - 145 mmol/L   Potassium 3.8 3.5 - 5.1 mmol/L   Chloride 108 101 - 111 mmol/L   CO2 24 22 - 32 mmol/L   Glucose, Bld 109 (H) 65 - 99 mg/dL   BUN 20 6 - 20 mg/dL   Creatinine, Ser 0.98 0.44 - 1.00 mg/dL   Calcium 8.3 (L) 8.9 - 10.3 mg/dL   GFR calc non Af Amer >60 >60 mL/min   GFR calc Af Amer >60 >60 mL/min    Comment: (NOTE) The eGFR has been calculated using the CKD EPI equation. This calculation has not been validated in all clinical situations. eGFR's persistently <60 mL/min signify possible Chronic Kidney Disease.    Anion gap 8 5 - 15  CBC     Status: Abnormal   Collection Time: 06/15/16  4:26 AM  Result Value Ref Range   WBC 6.4 4.0 - 10.5 K/uL   RBC 3.38 (L) 3.87 - 5.11 MIL/uL   Hemoglobin 9.7 (L) 12.0 - 15.0 g/dL   HCT 30.0 (L) 36.0 - 46.0 %   MCV 88.8 78.0 - 100.0 fL   MCH 28.7 26.0 - 34.0 pg   MCHC 32.3 30.0 - 36.0 g/dL   RDW 15.7 (H) 11.5 - 15.5 %   Platelets 266 150 - 400 K/uL  CK     Status: Abnormal   Collection Time: 06/15/16  4:26 AM  Result Value Ref Range   Total CK 20 (L) 38 - 234 U/L  Urine rapid drug screen (hosp performed)     Status: Abnormal   Collection Time: 06/15/16  7:26 AM  Result Value Ref Range   Opiates POSITIVE (A) NONE DETECTED   Cocaine POSITIVE (A) NONE DETECTED   Benzodiazepines NONE DETECTED NONE DETECTED   Amphetamines NONE DETECTED NONE DETECTED   Tetrahydrocannabinol NONE DETECTED NONE DETECTED   Barbiturates NONE DETECTED NONE DETECTED    Comment:  DRUG SCREEN FOR MEDICAL PURPOSES ONLY.  IF CONFIRMATION IS NEEDED FOR ANY PURPOSE, NOTIFY LAB WITHIN 5 DAYS.         LOWEST DETECTABLE LIMITS FOR URINE DRUG SCREEN Drug Class       Cutoff (ng/mL) Amphetamine      1000 Barbiturate      200 Benzodiazepine   086 Tricyclics       761 Opiates          300 Cocaine          300 THC              50     Dg Tibia/fibula Right  Result Date: 06/14/2016 CLINICAL DATA:  Right calf pain and open wounds. EXAM: RIGHT TIBIA AND FIBULA - 2 VIEW COMPARISON:  Radiographs of September 25, 2015. FINDINGS: There is no evidence of fracture or other focal bone lesions. Irregularity of posterior soft tissues of lower calf is noted consistent with infection or injury. No radiopaque foreign body is noted. IMPRESSION: No fracture or dislocation is noted. Irregularity of posterior soft tissues of lower calf consistent with infection or injury. Electronically Signed   By: Marijo Conception, M.D.   On: 06/14/2016 13:07   Dg Ankle Complete Left  Result Date: 06/14/2016 CLINICAL DATA:  Left ankle pain without known injury. EXAM: LEFT ANKLE COMPLETE - 3+ VIEW COMPARISON:  Radiographs of March 09, 2015. FINDINGS: There is no evidence of fracture, dislocation, or joint effusion. There is no evidence of arthropathy or other focal bone abnormality. Soft tissue gas is noted posteriorly consistent with infection. IMPRESSION: No definite osseous abnormality is noted. Soft tissue gas is seen posterior to distal tibia concerning for infection. Electronically Signed   By: Marijo Conception, M.D.   On: 06/14/2016 13:17   Mr Foot Right W Wo Contrast  Result Date: 06/14/2016 CLINICAL DATA:  History of cocaine use. Malodorous wounds with coronal in discharge. Necrotic ulcers on her fingers and lower extremities. EXAM: MRI OF LOWER RIGHT EXTREMITY WITHOUT AND WITH CONTRAST MRI OF THE RIGHT FOREFOOT WITHOUT AND WITH CONTRAST TECHNIQUE: Multiplanar, multisequence MR imaging of the right lower leg and right ankle was performed both before and after administration of intravenous contrast. CONTRAST:  49m MULTIHANCE  GADOBENATE DIMEGLUMINE 529 MG/ML IV SOLN COMPARISON:  None. FINDINGS: Patient motion degrades image quality limiting evaluation. TENDONS Peroneal: Peroneal longus tendon intact. Peroneal brevis intact. Posteromedial: Posterior tibial tendon intact. Flexor hallucis longus tendon intact. Flexor digitorum longus tendon intact. Anterior: Tibialis anterior tendon intact. Extensor hallucis longus tendon intact Extensor digitorum longus tendon intact. Achilles: Soft tissue wound overlying the muscular tendinous junction of the proximal Achilles tendon with increased signal and enhancement along the lateral aspect concerning for tendinosis likely secondary to an infectious etiology. Plantar Fascia: Intact. LIGAMENTS Lateral: Anterior talofibular ligament not well-visualized. Calcaneofibular ligament intact. Posterior talofibular ligament intact. Anterior and posterior tibiofibular ligaments intact. Medial: Deltoid ligament intact. Spring ligament intact. CARTILAGE Ankle Joint: No joint effusion. Normal ankle mortise. No chondral defect. Subtalar Joints/Sinus Tarsi: Normal subtalar joints. No subtalar joint effusion. Normal sinus tarsi. Bones: No marrow signal abnormality.  No fracture or dislocation. Soft Tissue: 1 x 3.5 x 2.8 cm peripherally enhancing fluid collection along the posterior-lateral aspect of the ankle most consistent with an abscess. Severe soft tissue edema and enhancement adjacent to the abscess. Severe edema in the distal soleus muscle at the musculotendinous junction and to a lesser extent distal medial and lateral gastrocnemius muscles with enhancement and  an overlying soft tissue wound most concerning for infectious myositis. 13 x 13 x 19 mm peripherally enhancing abnormality in the subcutaneous fat overlying the medial posterior calcaneus which may reflect an area of fat necrosis versus an abscess. IMPRESSION: 1. Soft tissue wound along the distal calf. Soft tissue wound overlying the muscular  tendinous junction of the proximal Achilles tendon with increased signal and enhancement along the lateral aspect concerning for tendinosis likely secondary to an infectious etiology. 2. 1 x 3.5 x 2.8 cm peripherally enhancing fluid collection along the posterior-lateral aspect of the ankle most consistent with an abscess. Severe soft tissue edema and enhancement adjacent to the abscess consistent with cellulitis. 3. Severe edema in the distal soleus muscle at the musculotendinous junction and to a lesser extent distal medial and lateral gastrocnemius muscles with enhancement and an overlying soft tissue wound most concerning for infectious myositis. 4. 13 x 13 x 19 mm peripherally enhancing abnormality in the subcutaneous fat overlying the medial posterior calcaneus which may reflect an area of fat necrosis versus an abscess. Electronically Signed   By: Kathreen Devoid   On: 06/14/2016 18:32   Mr Tibia Fibula Right W Wo Contrast  Result Date: 06/14/2016 CLINICAL DATA:  History of cocaine use. Malodorous wounds with coronal in discharge. Necrotic ulcers on her fingers and lower extremities. EXAM: MRI OF LOWER RIGHT EXTREMITY WITHOUT AND WITH CONTRAST MRI OF THE RIGHT FOREFOOT WITHOUT AND WITH CONTRAST TECHNIQUE: Multiplanar, multisequence MR imaging of the right lower leg and right ankle was performed both before and after administration of intravenous contrast. CONTRAST:  59m MULTIHANCE GADOBENATE DIMEGLUMINE 529 MG/ML IV SOLN COMPARISON:  None. FINDINGS: Patient motion degrades image quality limiting evaluation. TENDONS Peroneal: Peroneal longus tendon intact. Peroneal brevis intact. Posteromedial: Posterior tibial tendon intact. Flexor hallucis longus tendon intact. Flexor digitorum longus tendon intact. Anterior: Tibialis anterior tendon intact. Extensor hallucis longus tendon intact Extensor digitorum longus tendon intact. Achilles: Soft tissue wound overlying the muscular tendinous junction of the proximal  Achilles tendon with increased signal and enhancement along the lateral aspect concerning for tendinosis likely secondary to an infectious etiology. Plantar Fascia: Intact. LIGAMENTS Lateral: Anterior talofibular ligament not well-visualized. Calcaneofibular ligament intact. Posterior talofibular ligament intact. Anterior and posterior tibiofibular ligaments intact. Medial: Deltoid ligament intact. Spring ligament intact. CARTILAGE Ankle Joint: No joint effusion. Normal ankle mortise. No chondral defect. Subtalar Joints/Sinus Tarsi: Normal subtalar joints. No subtalar joint effusion. Normal sinus tarsi. Bones: No marrow signal abnormality.  No fracture or dislocation. Soft Tissue: 1 x 3.5 x 2.8 cm peripherally enhancing fluid collection along the posterior-lateral aspect of the ankle most consistent with an abscess. Severe soft tissue edema and enhancement adjacent to the abscess. Severe edema in the distal soleus muscle at the musculotendinous junction and to a lesser extent distal medial and lateral gastrocnemius muscles with enhancement and an overlying soft tissue wound most concerning for infectious myositis. 13 x 13 x 19 mm peripherally enhancing abnormality in the subcutaneous fat overlying the medial posterior calcaneus which may reflect an area of fat necrosis versus an abscess. IMPRESSION: 1. Soft tissue wound along the distal calf. Soft tissue wound overlying the muscular tendinous junction of the proximal Achilles tendon with increased signal and enhancement along the lateral aspect concerning for tendinosis likely secondary to an infectious etiology. 2. 1 x 3.5 x 2.8 cm peripherally enhancing fluid collection along the posterior-lateral aspect of the ankle most consistent with an abscess. Severe soft tissue edema and enhancement adjacent to the abscess  consistent with cellulitis. 3. Severe edema in the distal soleus muscle at the musculotendinous junction and to a lesser extent distal medial and lateral  gastrocnemius muscles with enhancement and an overlying soft tissue wound most concerning for infectious myositis. 4. 13 x 13 x 19 mm peripherally enhancing abnormality in the subcutaneous fat overlying the medial posterior calcaneus which may reflect an area of fat necrosis versus an abscess. Electronically Signed   By: Kathreen Devoid   On: 06/14/2016 18:32   Dg Hand Complete Left  Result Date: 06/14/2016 CLINICAL DATA:  Pain to left middle finger amputation site. EXAM: LEFT HAND - COMPLETE 3+ VIEW COMPARISON:  Radiographs of May 19, 2016. FINDINGS: Status post surgical amputation of most of the third proximal phalanx and more distal phalanges. Stable chronic erosive changes are seen involving the proximal interphalangeal joint of the fourth finger. No acute fracture or dislocation is noted. IMPRESSION: Status post surgical amputation of phalanges of middle finger. Stable chronic erosive changes involving proximal interphalangeal joint of fourth finger. Electronically Signed   By: Marijo Conception, M.D.   On: 06/14/2016 13:14   Dg Foot Complete Right  Result Date: 06/14/2016 CLINICAL DATA:  Right foot pain in wounds. EXAM: RIGHT FOOT COMPLETE - 3+ VIEW COMPARISON:  None. FINDINGS: No definite fracture is noted. Severe hallux valgus deformity of first metatarsophalangeal joint is noted with subluxation laterally of the first proximal phalanx relative to first metatarsal. There appears to be an open wound overlying the distal portion of the first metatarsal with some lucency in the bone in this area suggesting possible osteomyelitis. No radiopaque foreign body is noted. IMPRESSION: Severe hallux valgus deformity of first metatarsophalangeal joint with subluxation laterally of first proximal phalanx relative to first metatarsal. Open wound is seen overlying the distal portion of the first metatarsal with some lucency seen in the bone in this area suggesting possible osteomyelitis. MRI may be performed further  evaluation. Electronically Signed   By: Marijo Conception, M.D.   On: 06/14/2016 13:11    Review of Systems  Constitutional:       No change in ROS since 2 wks ago other than finger dehis.   All other systems reviewed and are negative.  Blood pressure (!) 145/73, pulse (!) 105, temperature 99.5 F (37.5 C), temperature source Oral, resp. rate 18, SpO2 100 %. Physical Exam  Constitutional:  Thin , chronically ill, malnurished apprearance  HENT:  Head: Normocephalic and atraumatic.  Eyes: Pupils are equal, round, and reactive to light.  Neck: Normal range of motion.  Cardiovascular: Normal rate.   tachycardic  Respiratory: Effort normal.  GI: Soft.  Musculoskeletal:  Multiple LE areas of vasculitis from chronic cocaine induced vasculitis.    Left long finger incision dehiscence with chronic granulation tissue and single suture remaining. All other sutures from closure are gone.   Skin:  Multiple areas of  Lower extremity vasculitis    Assessment/Plan: Left long finger amputation dehiscence. She states she has not been smoking crack since discharge but drug screen positive and medical notes states she was using a few days ago.  Poor prognosis for healing with past Hx. xrays of left hand shows stable erosive destruction of ring finger PIP joint and no osteo of long finger on plain xrays.  Will discuss with medical team plan. With continued use of cocaine further surgery unlikely to heal . Medical team can call me to discuss. My cell     [phone]  Marybelle Killings 06/16/2016,  7:48 AM

## 2016-06-17 LAB — BASIC METABOLIC PANEL
ANION GAP: 5 (ref 5–15)
ANION GAP: 6 (ref 5–15)
BUN: 11 mg/dL (ref 6–20)
BUN: 14 mg/dL (ref 6–20)
CALCIUM: 8.5 mg/dL — AB (ref 8.9–10.3)
CALCIUM: 8.3 mg/dL — AB (ref 8.9–10.3)
CO2: 24 mmol/L (ref 22–32)
CO2: 23 mmol/L (ref 22–32)
Chloride: 109 mmol/L (ref 101–111)
Chloride: 108 mmol/L (ref 101–111)
Creatinine, Ser: 0.97 mg/dL (ref 0.44–1.00)
Creatinine, Ser: 0.71 mg/dL (ref 0.44–1.00)
GFR calc Af Amer: >60 mL/min (ref >60)
Glucose, Bld: 170 mg/dL — ABNORMAL HIGH (ref 65–99)
Glucose, Bld: 94 mg/dL (ref 65–99)
POTASSIUM: 3.8 mmol/L (ref 3.5–5.1)
POTASSIUM: 4.4 mmol/L (ref 3.5–5.1)
SODIUM: 138 mmol/L (ref 135–145)
SODIUM: 137 mmol/L (ref 135–145)

## 2016-06-17 LAB — CBC WITH DIFFERENTIAL/PLATELET
BASOS ABS: 0 10*3/uL (ref 0.0–0.1)
BASOS PCT: 0 %
EOS PCT: 1 %
Eosinophils Absolute: 0.1 10*3/uL (ref 0.0–0.7)
HCT: 28.5 % — ABNORMAL LOW (ref 36.0–46.0)
Hemoglobin: 9.5 g/dL — ABNORMAL LOW (ref 12.0–15.0)
LYMPHS PCT: 30 %
Lymphs Abs: 1.4 10*3/uL (ref 0.7–4.0)
MCH: 28.7 pg (ref 26.0–34.0)
MCHC: 33.3 g/dL (ref 30.0–36.0)
MCV: 86.1 fL (ref 78.0–100.0)
Monocytes Absolute: 0.4 10*3/uL (ref 0.1–1.0)
Monocytes Relative: 8 %
Neutro Abs: 2.9 10*3/uL (ref 1.7–7.7)
Neutrophils Relative %: 61 %
PLATELETS: 263 10*3/uL (ref 150–400)
RBC: 3.31 MIL/uL — AB (ref 3.87–5.11)
RDW: 15.3 % (ref 11.5–15.5)
WBC: 4.7 10*3/uL (ref 4.0–10.5)

## 2016-06-17 LAB — MAGNESIUM: MAGNESIUM: 1.9 mg/dL (ref 1.7–2.4)

## 2016-06-17 MED ORDER — VANCOMYCIN HCL IN DEXTROSE 1-5 GM/200ML-% IV SOLN
1000.0000 mg | INTRAVENOUS | Status: DC
  Administered 2016-06-17: 1000 mg via INTRAVENOUS
  Filled 2016-06-17 (×2): qty 200

## 2016-06-17 NOTE — Progress Notes (Signed)
   Subjective: No acute events.  No further fevers and chills.  No chest pain, palpitations, or dyspnea.  Pain is relieved by her oxycodone, but sometimes is 8/10 before she asks for pain meds.   Objective:  Vital signs in last 24 hours: Vitals:   06/16/16 1504 06/16/16 1551 06/16/16 2158 06/17/16 0503  BP:  (!) 156/83 137/64 (!) 157/81  Pulse:  (!) 108 78 81  Resp:  18 18 18   Temp:  100.2 F (37.9 C) 98.2 F (36.8 C) 98.4 F (36.9 C)  TempSrc:   Oral Oral  SpO2:  100% 100% 100%  Weight: 98 lb (44.5 kg)     Height: 5' 1"  (1.549 m)      Physical Exam  Constitutional: She is oriented to person, place, and time.  Cachetic, chronically ill appearing  Cardiovascular: Normal rate and regular rhythm.   Pulmonary/Chest: Effort normal and breath sounds normal.  Musculoskeletal:  L 3rd finger amputation covered with clean bandage  Neurological: She is alert and oriented to person, place, and time.  Skin:  RLE wound with exposed fascia and copious purulence similar to admission Psychiatric: She has a normal mood and affect. Her behavior is normal.       CBC Latest Ref Rng & Units 06/17/2016 06/16/2016 06/15/2016  WBC 4.0 - 10.5 K/uL 4.7 6.1 6.4  Hemoglobin 12.0 - 15.0 g/dL 9.5(L) 10.5(L) 9.7(L)  Hematocrit 36.0 - 46.0 % 28.5(L) 31.5(L) 30.0(L)  Platelets 150 - 400 K/uL 263 271 266   BMP Latest Ref Rng & Units 06/17/2016 06/17/2016 06/16/2016  Glucose 65 - 99 mg/dL 94 170(H) 141(H)  BUN 6 - 20 mg/dL 11 14 9   Creatinine 0.44 - 1.00 mg/dL 0.71 0.97 1.04(H)  BUN/Creat Ratio 9 - 23 - - -  Sodium 135 - 145 mmol/L 137 138 137  Potassium 3.5 - 5.1 mmol/L 4.4 3.8 2.9(L)  Chloride 101 - 111 mmol/L 108 109 103  CO2 22 - 32 mmol/L 23 24 24   Calcium 8.9 - 10.3 mg/dL 8.3(L) 8.5(L) 8.4(L)    MRI R Leg and Foot (06/14/16): No signs of osteo.  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  She was febrile overnight, but given her antibiotics were started only 1 day ago this does not represented a treatment failure.  Due to her ongoing cocaine abuse, vasculitis, and impaired wound healing, she is not a surgical candidate for amputation per orthopedic surgeon Dr [name].  Her prognosis is poor with her vasculitis, poor social situation, and limited options for medical or surgical management of her infected chronic wounds. -Continue IV vancomycin -Follow-up blood cultures -Clean, dry dressings -Wound consult  #Hypokalemia Resolved.  K 2.8 and Mg 1.6 on 9/12, repleted K and Mg. -Follow BMP and Mg  #Acute Kidney Injury Resolved with IVF, consistent with prerenal etiology.  #HTN Not taking prescribed lisinopril at home.   -Lisinopril 62m daily  Dispo: Anticipated discharge in approximately 3 day(s).   MMinus Liberty MD 06/17/2016, 10:31 AM Pager: [phone]

## 2016-06-17 NOTE — Consult Note (Addendum)
[facility] Nurse wound consult note Reason for Consult: Multiple Wounds on Right leg, right foot, and one on Left medial ankle. Wound type: Full Thickness Pressure Ulcer POA: No, not pressure related Measurement: Left medial ankle 2.5 cm x 2.5 cm x 0.25, tendon exposed, granulating with mod amt yellow exudate, slight odor. Right medial foot at base of great toe: 3cm x 3cm x 0.5cm, tendon exposed, 100% granulating, mod yellow exudate, mild odor. Right posterior leg has 4 wounds listed from distal to proximal 2cm x 2cm x 0.5 cm, granulating with mod yellow exudate, mild odor 8cm x 10cm,x 1cm, tendons showing, granulating with mod yellow exudate, mild odor. 1cm x 1cm x 0.25cm, granulating with mod yellow exudate, mild odor. 3cm x 2cm x 0.5 cm, granulating with mod yellow exudate, mild odor. Wound bed: see above Drainage (amount, consistency, odor) see above Periwound: intact Dressing procedure/placement/frequency: I have provided nurses with orders for Daily cleansing and applying hydrogel, telfa, kerlex with instructions to pre-medicate.  Pt states "I can't do this anymore, I am too tired, I have had enough, I'm ready to give up." Please refer to Ortho consult note.  We will not follow, but will remain available to this patient, to nursing, and the medical and/or surgical teams.   Fara Olden, RN-C, WTA-C Wound Treatment Associate

## 2016-06-17 NOTE — Progress Notes (Signed)
Pharmacy Antibiotic Note  [name] is a 55 y.o. female with hx levamisole/cocaine induced vasculitis and chronic leg wounds hospitalized in 05/2016 for management with amputation of left 3rd finger. Returns to ED 9/10 with c/o left leg wound with drainage.  Continues on vancomycin for wound infection. Ortho consulted - poor prognosis of healing with further surgery and continued cocaine use. Likely not a surgical candidate. Pt improving clinically, though vancomycin dose likely not enough to achieve target trough. Will adjust empirically. Remains afebrile, WBC now WNL.    Plan: -Vancomycin to 1g q24h -Monitor clinical course, renal function, VT prn -F/U C&S, abx deescalation / LOT -Consider proteus coverage if worsens clinically - grew from wound cx last admission ______________________________ Height: 5' 1"  (154.9 cm) (per 05/18/16 encounter) Weight: 98 lb (44.5 kg) (per 05/18/16) IBW/kg (Calculated) : 47.8  Temp (24hrs), Avg:98.9 F (37.2 C), Min:98.2 F (36.8 C), Max:100.2 F (37.9 C)   Recent Labs Lab 06/14/16 1145 06/14/16 1150 06/15/16 0426 06/16/16 1020 06/16/16 1453 06/17/16 0007 06/17/16 0553  WBC 13.5*  --  6.4 6.1  --   --  4.7  CREATININE 1.31*  --  0.98 0.78 1.04* 0.97 0.71  LATICACIDVEN  --  1.70  --   --   --   --   --     Estimated Creatinine Clearance: 57.1 mL/min (by C-G formula based on SCr of 0.71 mg/dL).    Allergies  Allergen Reactions  . Acetaminophen Swelling and Other (See Comments)    Reaction:  Eyelid swelling   Antimicrobials this admission: 9/10 vanc >>  Levels/dose changes this admission: 9/13: Vancomycin 7100m q24h >> 1g q24h  Microbiology results: 9/11 BCx: NGTD  8/14 ucx: insignif growth FINAL 8/14 bcx x2 neg FINAL 8/14 R leg wound: MRSA and proteus mirabilis (pansensitive) 8/18 MRSA PCR (-)   Thank you for allowing pharmacy to be a part of this patient's care.  RStephens November PharmD Clinical Pharmacist 11:09 AM,  06/17/2016

## 2016-06-18 LAB — CBC WITH DIFFERENTIAL/PLATELET
BASOS ABS: 0 10*3/uL (ref 0.0–0.1)
Basophils Relative: 0 %
Eosinophils Absolute: 0.1 10*3/uL (ref 0.0–0.7)
Eosinophils Relative: 1 %
HEMATOCRIT: 29.3 % — AB (ref 36.0–46.0)
Hemoglobin: 9.6 g/dL — ABNORMAL LOW (ref 12.0–15.0)
LYMPHS ABS: 1.9 10*3/uL (ref 0.7–4.0)
Lymphocytes Relative: 33 %
MCH: 28.7 pg (ref 26.0–34.0)
MCHC: 32.8 g/dL (ref 30.0–36.0)
MCV: 87.7 fL (ref 78.0–100.0)
MONO ABS: 0.5 10*3/uL (ref 0.1–1.0)
MONOS PCT: 9 %
NEUTROS ABS: 3.2 10*3/uL (ref 1.7–7.7)
Neutrophils Relative %: 57 %
Platelets: 306 10*3/uL (ref 150–400)
RBC: 3.34 MIL/uL — ABNORMAL LOW (ref 3.87–5.11)
RDW: 15.7 % — AB (ref 11.5–15.5)
WBC: 5.7 10*3/uL (ref 4.0–10.5)

## 2016-06-18 LAB — BASIC METABOLIC PANEL
Anion gap: 5 (ref 5–15)
BUN: 13 mg/dL (ref 6–20)
CALCIUM: 8.9 mg/dL (ref 8.9–10.3)
CO2: 25 mmol/L (ref 22–32)
CREATININE: 0.73 mg/dL (ref 0.44–1.00)
Chloride: 108 mmol/L (ref 101–111)
GFR calc non Af Amer: >60 mL/min (ref >60)
GLUCOSE: 97 mg/dL (ref 65–99)
Potassium: 5 mmol/L (ref 3.5–5.1)
Sodium: 138 mmol/L (ref 135–145)

## 2016-06-18 LAB — MAGNESIUM: Magnesium: 1.6 mg/dL — ABNORMAL LOW (ref 1.7–2.4)

## 2016-06-18 MED ORDER — MAGNESIUM CHLORIDE 64 MG PO TBEC
1.0000 | DELAYED_RELEASE_TABLET | Freq: Two times a day (BID) | ORAL | Status: AC
  Administered 2016-06-18 – 2016-06-19 (×2): 64 mg via ORAL
  Filled 2016-06-18 (×2): qty 1

## 2016-06-18 NOTE — Progress Notes (Signed)
PT Cancellation Note  Patient Details Name: [name] MRN: [id number] DOB: April 15, 1961   Cancelled Treatment:    Reason Eval/Treat Not Completed: Pain limiting ability to participate. Pt requested to hold off on session til after pain meds.  Will check back.  Will need to coordinate pain medicine with treatment for maximal participation.   Peyten Weare,Hally LUBECK 06/18/2016, 2:15 PM

## 2016-06-18 NOTE — Progress Notes (Signed)
Subjective: No acute events overnight.  Reports occasional chills.  No chest pain, palpitations, or dyspnea.  Pain is relieved by her oxycodone, but when there is pressure on the back of her right leg it is painful.  Objective:  Vital signs in last 24 hours: Vitals:   06/17/16 1537 06/17/16 2202 06/18/16 0544 06/18/16 1001  BP: 138/72 140/66 137/62 118/63  Pulse: 88 91 71 80  Resp: 20 16 18    Temp: 97.7 F (36.5 C) 98.5 F (36.9 C) 98.7 F (37.1 C)   TempSrc: Oral Oral Oral   SpO2: 99% 100% 100%   Weight:      Height:       Physical Exam  Constitutional: She is oriented to person, place, and time.  Cachetic, chronically ill appearing  Cardiovascular: Normal rate and regular rhythm.   Pulmonary/Chest: Effort normal and breath sounds normal.  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  Skin:  RLE wound wrapped with clean bandages Psychiatric: She has a normal mood and affect. Her behavior is normal.    CBC Latest Ref Rng & Units 06/18/2016 06/17/2016 06/16/2016  WBC 4.0 - 10.5 K/uL 5.7 4.7 6.1  Hemoglobin 12.0 - 15.0 g/dL 9.6(L) 9.5(L) 10.5(L)  Hematocrit 36.0 - 46.0 % 29.3(L) 28.5(L) 31.5(L)  Platelets 150 - 400 K/uL 306 263 271   BMP Latest Ref Rng & Units 06/18/2016 06/17/2016 06/17/2016  Glucose 65 - 99 mg/dL 97 94 170(H)  BUN 6 - 20 mg/dL 13 11 14   Creatinine 0.44 - 1.00 mg/dL 0.73 0.71 0.97  BUN/Creat Ratio 9 - 23 - - -  Sodium 135 - 145 mmol/L 138 137 138  Potassium 3.5 - 5.1 mmol/L 5.0 4.4 3.8  Chloride 101 - 111 mmol/L 108 108 109  CO2 22 - 32 mmol/L 25 23 24   Calcium 8.9 - 10.3 mg/dL 8.9 8.3(L) 8.5(L)    MRI R Leg and Foot (06/14/16): No signs of osteo.  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  She was febrile overnight, but given her antibiotics were started only 1 day ago this does not represented a treatment failure.  Due to her ongoing cocaine abuse, vasculitis, and impaired wound healing, she is not a surgical candidate for amputation per orthopedic surgeon Dr [name].  Her prognosis is poor with her vasculitis, poor social situation, and limited options for medical or surgical management of her infected chronic wounds.  Seen by Wound Care, and guidance for wrapping given to patient and nursing staff.  Her last fever (100.2) was 1600 on 9/12, so she is approaching 48 hours afebrile.  Will plan to discharge with close serial follow up at [facility] and PO antibiotics. -Continue IV vancomycin today -Bactrim DS (160/800) BID for 2 weeks -Follow-up blood cultures -Clean, dry dressings changed daily  #Hypokalemia Resolved. -Follow BMP and Mg  #Acute Kidney Injury Resolved with IVF, consistent with prerenal etiology.  #HTN Not taking prescribed lisinopril at home.   -  Lisinopril 38m daily  Dispo: Anticipated discharge in approximately 1 day(s).   MMinus Liberty MD 06/18/2016, 10:37 AM Pager: [phone]

## 2016-06-19 DIAGNOSIS — L958 Other vasculitis limited to the skin: Secondary | ICD-10-CM

## 2016-06-19 DIAGNOSIS — M60009 Infective myositis, unspecified site: Secondary | ICD-10-CM

## 2016-06-19 DIAGNOSIS — F14288 Cocaine dependence with other cocaine-induced disorder: Secondary | ICD-10-CM

## 2016-06-19 DIAGNOSIS — M7750 Other enthesopathy of unspecified foot: Secondary | ICD-10-CM

## 2016-06-19 MED ORDER — SULFAMETHOXAZOLE-TRIMETHOPRIM 800-160 MG PO TABS
1.0000 | ORAL_TABLET | Freq: Two times a day (BID) | ORAL | Status: DC
  Administered 2016-06-19: 1 via ORAL
  Filled 2016-06-19: qty 1

## 2016-06-19 MED ORDER — OXYCODONE HCL 5 MG PO TABS: 5.0000 mg | ORAL_TABLET | Freq: Four times a day (QID) | ORAL | 0 refills | Status: DC | PRN

## 2016-06-19 MED ORDER — SULFAMETHOXAZOLE-TRIMETHOPRIM 800-160 MG PO TABS: 1.0000 | ORAL_TABLET | Freq: Two times a day (BID) | ORAL | 0 refills | Status: DC

## 2016-06-19 NOTE — Progress Notes (Signed)
CSW spoke with pt about PT recommendation for SNF- pt initially agreeable but when CSW informed about Medicaid requirement for pt to stay for 30 days and give up disability check patient became adamantly against placement.  States that she would not be agreeable to staying more than a week.  Pt agreeable to whatever home services are available  CSW signing off  Jorge Ny, [place] Social Worker [phone]

## 2016-06-19 NOTE — Discharge Instructions (Signed)
You were admitted to the hospital for worsening and infection of the wounds on your legs and amputated finger.  These wounds are getting worse because of your cocaine use.  We treated you with IV antibiotics, and did not find evidence to suggest that there was infection in your bone or blood at this time.  There are no available surgical options for you while you are using cocaine, because you will not be able to heal from surgery.  We will give you oral antibiotics to take for 2 weeks after leaving the hospital.  Antibiotics alone may not be able to clear your infection, and you are at risk of dying from this.  Your only chance to heal is to stop using cocaine.  We will see you in clinic on a regular basis to check your wounds and see how you are doing.  A Home Health Nurse, Social Worker, and Physical Therapist will also visit you to work with you.  Keep your wounds wrapped with clean, dry dressings and change them daily.  If you have fevers or chills, or cannot eat and drink because of pain or illness, please call the clinic or go to the ED.

## 2016-06-19 NOTE — Progress Notes (Signed)
Subjective: No acute events overnight.  No fevers, chest pain, palpitations, or dyspnea.  Pain is well controlled at rest, but pain with pressure or weight bearing.  Understands plan to discharge with oral antibiotics today with close follow-up in clinic.  Objective:  Vital signs in last 24 hours: Vitals:   06/18/16 1001 06/18/16 1350 06/18/16 2236 06/19/16 0905  BP: 118/63 92/69 107/62 117/60  Pulse: 80 84 99   Resp:  16 16   Temp:  99 F (37.2 C) 98.9 F (37.2 C)   TempSrc:  Oral Oral   SpO2:  100% 99%   Weight:      Height:       Physical Exam  Constitutional: She is oriented to person, place, and time.  Cachetic, chronically ill appearing  Cardiovascular: Normal rate and regular rhythm.   Pulmonary/Chest: Effort normal and breath sounds normal.  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  Skin:  RLE wound wrapped with clean bandages Psychiatric: She has a normal mood and affect. Her behavior is normal.    CBC Latest Ref Rng & Units 06/18/2016 06/17/2016 06/16/2016  WBC 4.0 - 10.5 K/uL 5.7 4.7 6.1  Hemoglobin 12.0 - 15.0 g/dL 9.6(L) 9.5(L) 10.5(L)  Hematocrit 36.0 - 46.0 % 29.3(L) 28.5(L) 31.5(L)  Platelets 150 - 400 K/uL 306 263 271   BMP Latest Ref Rng & Units 06/18/2016 06/17/2016 06/17/2016  Glucose 65 - 99 mg/dL 97 94 170(H)  BUN 6 - 20 mg/dL 13 11 14   Creatinine 0.44 - 1.00 mg/dL 0.73 0.71 0.97  BUN/Creat Ratio 9 - 23 - - -  Sodium 135 - 145 mmol/L 138 137 138  Potassium 3.5 - 5.1 mmol/L 5.0 4.4 3.8  Chloride 101 - 111 mmol/L 108 108 109  CO2 22 - 32 mmol/L 25 23 24   Calcium 8.9 - 10.3 mg/dL 8.9 8.3(L) 8.5(L)    MRI R Leg and Foot (06/14/16): No signs of osteo.  Soft tissue findings consistent with myositis, tendinosis, cellulitis, abscess  Blood Cultures x2 (from 06/15/16): no growth 3 days Assessment/Plan:  Principal Problem:  Cocaine-induced vascular disorder ([place]) Active Problems:   Essential hypertension   Major depression, recurrent (HCC)   Cocaine use disorder, severe, dependence (HCC)   Wound dehiscence   Myalgia and myositis  #Levimasole Vasculitis #Chronic Wounds Given the depth and extent of the wound on her R leg, concern the limb may not be salvagable.  She was febrile overnight, but given her antibiotics were started only 1 day ago this does not represented a treatment failure.  Due to her ongoing cocaine abuse, vasculitis, and impaired wound healing, she is not a surgical candidate for amputation per orthopedic surgeon Dr [name].  Her prognosis is poor with her vasculitis, poor social situation, and limited options for medical or surgical management of her infected chronic wounds.  Seen by Wound Care, and guidance for wrapping given to patient and nursing staff.  Her last fever (100.2) was 1600 on 9/12, so she is now over 2 days afebrile.  Will plan to discharge with close serial follow up at [facility] and PO antibiotics. -Bactrim DS (160/800) BID for 2 weeks -Discharge home today -Clean, dry dressings changed daily -Follow up at [facility] biweekly  #HTN Normotensive.  Not taking prescribed lisinopril at home.   -Lisinopril 32m daily  #Hypokalemia #Acute Kidney Injury Resolved.    Dispo: Anticipated discharge today.  Minus Liberty, MD 06/19/2016, 9:31 AM Pager: [phone]

## 2016-06-19 NOTE — Evaluation (Signed)
Physical Therapy Evaluation Patient Details Name: [name] MRN: [id number] DOB: [birth year] Today's Date: 06/19/2016   History of Present Illness  55 y.o. 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 PMH: vasculitis, RA, hypertension, depression, cocaine abuse.   Clinical Impression  Pt admitted with/for vasculitic wounds.  Pt currently limited functionally due to the problems listed. ( See problems list.)   Pt will benefit from PT to maximize function and safety in order to get ready for next venue listed below.    Follow Up Recommendations SNF    Equipment Recommendations  Rolling walker with 5" wheels    Recommendations for Other Services       Precautions / Restrictions Precautions Precautions: Fall      Mobility  Bed Mobility Overal bed mobility: Modified Independent                Transfers Overall transfer level: Needs assistance   Transfers: Sit to/from Stand Sit to Stand: Supervision         General transfer comment: cues for most safe technque  Ambulation/Gait Ambulation/Gait assistance: Min guard Ambulation Distance (Feet): 10 Feet (forward and back) Assistive device: Rolling walker (2 wheeled) Gait Pattern/deviations: Step-to pattern     General Gait Details: unable to put weight down on her R foot.  Immediately R LE became too painful to continue when trying to accept weight through toes.  Stairs            Wheelchair Mobility    Modified Rankin (Stroke Patients Only)       Balance Overall balance assessment: Needs assistance Sitting-balance support: No upper extremity supported Sitting balance-Leahy Scale: Good       Standing balance-Leahy Scale: Poor Standing balance comment: reliant on AD                             Pertinent Vitals/Pain Pain Assessment: Faces Faces Pain Scale: Hurts  worst Pain Location: R leg at area of achelles and calf Pain Descriptors / Indicators: Burning;Crying;Sharp Pain Intervention(s): Monitored during session;Patient requesting pain meds-RN notified;Repositioned    Home Living Family/patient expects to be discharged to:: Group home Living Arrangements: Alone Available Help at Discharge: Friend(s);Available 24 hours/day   Home Access: Stairs to enter Entrance Stairs-Rails: Right Entrance Stairs-Number of Steps: 1 Home Layout: Two level Home Equipment: Cane - single point      Prior Function Level of Independence: Independent         Comments: Pt reports she always uses the cane both at home and out in public.     Hand Dominance   Dominant Hand: Right    Extremity/Trunk Assessment   Upper Extremity Assessment: Defer to OT evaluation           Lower Extremity Assessment: Generalized weakness         Communication   Communication: No difficulties  Cognition Arousal/Alertness: Awake/alert Behavior During Therapy: WFL for tasks assessed/performed Overall Cognitive Status: Within Functional Limits for tasks assessed (In denial of condition)                      General Comments General comments (skin integrity, edema, etc.): Pt needs the nursing care to keep wounds clean and dressed and needs any therapy a center can provide to help her mobilize on a basic level    Exercises  Assessment/Plan    PT Assessment Patient needs continued PT services  PT Problem List Decreased strength;Decreased mobility;Decreased knowledge of use of DME;Pain       PT Diagnosis   Vasculitis ([place])  Cocaine abuse  Wound dehiscence, initial encounter  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, right ([place])  Osteomyelitis ([place])    PT Treatment Interventions Gait training;Functional mobility training;Therapeutic activities;Patient/family education    PT Goals (Current goals can be found in the Care Plan section)  Acute  Rehab PT Goals Patient Stated Goal: I want to go home PT Goal Formulation: With patient Time For Goal Achievement: 06/19/16 Potential to Achieve Goals: Fair    Frequency Min 3X/week   Barriers to discharge        Co-evaluation               End of Session   Activity Tolerance: Patient tolerated treatment well Patient left: in bed;with call bell/phone within reach Nurse Communication: Mobility status         Time: 0932-3557 PT Time Calculation (min) (ACUTE ONLY): 48 min   Charges:   PT Evaluation $PT Eval Moderate Complexity: 1 Procedure PT Treatments $Gait Training: 8-22 mins $Therapeutic Activity: 8-22 mins   PT G Codes:        [name], [name] 06/19/2016, 10:56 AM 06/19/2016  [name], PT [phone]  (pager)

## 2016-06-19 NOTE — Progress Notes (Signed)
Pt discharged to home with [facility] and [facility]. BP stable, MD aware of change in BP. IV discontinued before discharge. Pt verbalizes understanding of discharge instructions via teachback. All questions answered.

## 2016-06-19 NOTE — Evaluation (Signed)
Occupational Therapy Evaluation Patient Details Name: [name] MRN: [id number] DOB: 31-Jan-1961 Today's Date: 06/19/2016    History of Present Illness 55 y.o. 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 PMH: vasculitis, RA, hypertension, depression, cocaine abuse.    Clinical Impression   PTA, pt was independent with ADLs and used [facility] for mobility. Pt currently requires supervision for all ADLs and mobility due to increased RLE pain and hopping LLE as a result. Educated pt on safe use of RW during ADLs and advised her to avoid getting in/out of shower until cleared from wound doctor and until having less pain and balance is improved. Pt refusing SNF and returning to boarding house where she will have 24/7 supervision from roommates. Pt will benefit from continued acute OT to increase independence and safety with ADLs and mobility to allow for safe discharge home. Will continue to follow acutely.    Follow Up Recommendations  No OT follow up;Supervision/Assistance - 24 hour    Equipment Recommendations  None recommended by OT    Recommendations for Other Services       Precautions / Restrictions Precautions Precautions: Fall Restrictions Weight Bearing Restrictions: No      Mobility Bed Mobility Overal bed mobility: Modified Independent                Transfers Overall transfer level: Needs assistance Equipment used: Rolling walker (2 wheeled) Transfers: Sit to/from Stand Sit to Stand: Supervision         General transfer comment: Supervision for safety as pt hopping on LLE due to RLE pain.    Balance Overall balance assessment: Needs assistance Sitting-balance support: No upper extremity supported;Feet supported Sitting balance-Leahy Scale: Good     Standing balance support: Bilateral upper extremity supported;During functional  activity Standing balance-Leahy Scale: Poor Standing balance comment: Heavily reliant on BUE support to keep RLE off ground due to pain.                            ADL Overall ADL's : Needs assistance/impaired     Grooming: Wash/dry hands;Wash/dry face;Supervision/safety;Standing   Upper Body Bathing: Supervision/ safety;Sitting   Lower Body Bathing: Supervison/ safety;Sit to/from stand   Upper Body Dressing : Supervision/safety;Sitting   Lower Body Dressing: Supervision/safety;Sit to/from stand   Toilet Transfer: Supervision/safety;Regular Toilet;RW   Toileting- Water quality scientist and Hygiene: Supervision/safety;Sit to/from stand       Functional mobility during ADLs: Supervision/safety;Rolling walker General ADL Comments: Advised pt to avoid getting into tub/shower and to sponge bathe instead. until cleared from wound MD and for safety due to pain     Vision Vision Assessment?: No apparent visual deficits   Perception     Praxis      Pertinent Vitals/Pain Pain Assessment: 0-10 Pain Score: 10-Worst pain ever Faces Pain Scale: Hurts worst Pain Location: R leg Pain Descriptors / Indicators: Burning;Crying;Guarding Pain Intervention(s): Limited activity within patient's tolerance;Monitored during session;Premedicated before session;Repositioned     Hand Dominance Right   Extremity/Trunk Assessment Upper Extremity Assessment Upper Extremity Assessment: Generalized weakness   Lower Extremity Assessment Lower Extremity Assessment: RLE deficits/detail RLE Deficits / Details: increased pain, barely putting R toes on ground   Cervical / Trunk Assessment Cervical / Trunk Assessment: Normal   Communication Communication Communication: No difficulties   Cognition Arousal/Alertness: Awake/alert Behavior During Therapy: WFL for tasks assessed/performed Overall Cognitive Status:  Within Functional Limits for tasks assessed (in denial of condition)                      General Comments       Exercises       Shoulder Instructions      Home Living Family/patient expects to be discharged to:: Group home Living Arrangements: Alone Available Help at Discharge: Friend(s);Available 24 hours/day   Home Access: Stairs to enter Entrance Stairs-Number of Steps: 1 Entrance Stairs-Rails: Right Home Layout: Two level;Bed/bath upstairs Alternate Level Stairs-Number of Steps: flight Alternate Level Stairs-Rails: Right;Left Bathroom Shower/Tub: Tub/shower unit Shower/tub characteristics: Architectural technologist: Standard     Home Equipment: Cane - single point          Prior Functioning/Environment Level of Independence: Independent with assistive device(s)        Comments: Pt reports she always uses the cane both at home and out in public.; uses bus for transportation    OT Diagnosis:   Vasculitis ([place])  Cocaine abuse  Wound dehiscence, initial encounter  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, right ([place])  Osteomyelitis ([place])    OT Problem List: Decreased strength;Decreased activity tolerance;Impaired balance (sitting and/or standing);Decreased cognition;Decreased safety awareness;Decreased knowledge of use of DME or AE;Pain   OT Treatment/Interventions: Self-care/ADL training;Therapeutic exercise;DME and/or AE instruction;Therapeutic activities;Balance training;Patient/family education    OT Goals(Current goals can be found in the care plan section) Acute Rehab OT Goals Patient Stated Goal: I want to go home OT Goal Formulation: With patient Time For Goal Achievement: 07/03/16 Potential to Achieve Goals: Good ADL Goals Pt Will Perform Upper Body Dressing: with modified independence;sitting Pt Will Perform Lower Body Dressing: with modified independence;sit to/from stand Pt Will Transfer to Toilet: with modified independence;ambulating;regular height toilet Pt Will Perform Toileting - Clothing Manipulation and  hygiene: with modified independence;sit to/from stand  OT Frequency: Min 2X/week   Barriers to D/C:            Co-evaluation              End of Session Equipment Utilized During Treatment: Gait belt;Rolling walker Nurse Communication: Mobility status  Activity Tolerance: Patient limited by pain Patient left: in bed;with call bell/phone within reach;with bed alarm set   Time: 1218-1239 OT Time Calculation (min): 21 min Charges:  OT General Charges $OT Visit: 1 Procedure OT Evaluation $OT Eval Moderate Complexity: 1 Procedure G-Codes:    Redmond Baseman, OTR/L Pager: [phone] 06/19/2016, 12:52 PM

## 2016-06-20 LAB — CULTURE, BLOOD (ROUTINE X 2)
CULTURE: NO GROWTH
Culture: NO GROWTH

## 2016-06-22 ENCOUNTER — Ambulatory Visit: Payer: Self-pay

## 2016-06-23 ENCOUNTER — Encounter: Payer: Self-pay | Admitting: Internal Medicine

## 2016-06-24 ENCOUNTER — Ambulatory Visit (INDEPENDENT_AMBULATORY_CARE_PROVIDER_SITE_OTHER): Payer: Medicaid Other | Admitting: Internal Medicine

## 2016-06-24 ENCOUNTER — Encounter: Payer: Self-pay | Admitting: Internal Medicine

## 2016-06-24 ENCOUNTER — Telehealth: Payer: Self-pay | Admitting: Internal Medicine

## 2016-06-24 VITALS — BP 162/89 | HR 111 | Temp 98.0°F | Ht 61.5 in | Wt 86.2 lb

## 2016-06-24 DIAGNOSIS — F14288 Cocaine dependence with other cocaine-induced disorder: Secondary | ICD-10-CM | POA: Diagnosis not present

## 2016-06-24 DIAGNOSIS — Z89029 Acquired absence of unspecified finger(s): Secondary | ICD-10-CM

## 2016-06-24 DIAGNOSIS — S91001D Unspecified open wound, right ankle, subsequent encounter: Secondary | ICD-10-CM

## 2016-06-24 DIAGNOSIS — L958 Other vasculitis limited to the skin: Secondary | ICD-10-CM

## 2016-06-24 DIAGNOSIS — L97213 Non-pressure chronic ulcer of right calf with necrosis of muscle: Secondary | ICD-10-CM

## 2016-06-24 DIAGNOSIS — L97203 Non-pressure chronic ulcer of unspecified calf with necrosis of muscle: Secondary | ICD-10-CM

## 2016-06-24 DIAGNOSIS — I10 Essential (primary) hypertension: Secondary | ICD-10-CM

## 2016-06-24 DIAGNOSIS — S81001D Unspecified open wound, right knee, subsequent encounter: Secondary | ICD-10-CM

## 2016-06-24 DIAGNOSIS — S81801D Unspecified open wound, right lower leg, subsequent encounter: Secondary | ICD-10-CM

## 2016-06-24 DIAGNOSIS — M79604 Pain in right leg: Secondary | ICD-10-CM

## 2016-06-24 MED ORDER — KETOROLAC TROMETHAMINE 30 MG/ML IJ SOLN
30.0000 mg | Freq: Once | INTRAMUSCULAR | Status: AC
  Administered 2016-06-24: 30 mg via INTRAMUSCULAR

## 2016-06-24 MED ORDER — TRAMADOL HCL 50 MG PO TABS: 50.0000 mg | ORAL_TABLET | Freq: Four times a day (QID) | ORAL | 0 refills | Status: DC | PRN

## 2016-06-24 NOTE — Patient Instructions (Addendum)
Thank you for coming to the clinic today. I'm sorry that you are having these bad wounds and pain. We will provide you with pain medicine and dressing today. I will send a referral for wound care clinic to manage your bad wounds. It's really important that you work on your cocaine use, as it is risking your life at this point. If you need any more assistance please let us know.

## 2016-06-24 NOTE — Progress Notes (Signed)
   CC: For follow-up of her right leg wound due to cocaine induced vasculitis.  HPI:  [name] is a 55 y.o. 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 She was recently discharged from hospital on September 15 due to wound infection. During her current hospital admission the internal medicine team consulted with [name] from orthopedic surgery,unfortunately because of her ongoing cocaine addiction the risks of amputation of the leg outweighed the benefits and we were left with conservative management of these aggressive wounds. Indeed this was the course of her left third finger amputation, which dehisced this hospitalization. [name] also recommended conservative management of this open joint space.  During her hospital stay she was offered to transfer her to a skilled nursing facility for wound care, but she declined stating that she wants to stay in her own apartment. She was told in the hospital that these leg wounds will likely not heal, and can even cause death, if she continue to use cocaine. She still continue to use cocaine regularly, her last use was 2 days ago. She she had a long history of frequent vasculitis wound due to her cocaine abuse. She complained of pain 10 over 10 in intensity in her right lower leg, she states that she has used on her oxycodone during this week. Past Medical History:  Diagnosis Date  . CAP (community acquired pneumonia) 03/2014 X 2  . Cocaine abuse    ongoing with resultant vaculitis.  . Daily headache   . Depression   . Hypertension   . Inflammatory arthritis ([place])   . Migraines    "probably 5-6/yr" (04/21/2014)  . Normocytic anemia    BL Hgb 9.8-12. Last anemia panel 04/2010 - showing Fe 19, ferritin 101.  Pt on monthly B12 injections  . Rheumatoid arthritis(714.0)    patient reported  . VASCULITIS 04/17/2010   2/2 levimasole toxicity vs autoimmune d/o       . Vasculitis ([place])    2/2 Levimasole toxicity. Followed by Dr. Louanne Skye    Review of Systems:  As per HPI.  Physical Exam:  Vitals:   06/24/16 1440  BP: (!) 162/89  Pulse: (!) 111  Temp: 98 F (36.7 C)  TempSrc: Oral  SpO2: 100%  Weight: 86 lb 3.2 oz (39.1 kg)  Height: 5' 1.5" (1.562 m)   Gen. Lean, emaciated lady. Crying with pain. Extremities. Multiple malodorous wounds, with exposed tendon, fascia, and subcutaneous fat covering approximately  whole  of posterior R leg.   Smaller wounds of similar character on medial R foot over head of 1st metatarsal and on medial L ankle about 3x3 cm in size.  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 No discharge.  Numerous hypopigmented scars with hyperpigmented margins in all extremities, and on her nose and ears.  Lungs. Clear bilaterally CV. RRR.  Assessment & Plan:   See Encounters Tab for problem based charting.  Patient seen with Dr. Angelia Mould.

## 2016-06-24 NOTE — Assessment & Plan Note (Signed)
She still continue to use cocaine regularly, her last use was 2 days ago.  She was counseled multiple times, and was told that these wounds can take her life if she will not stop using cocaine.

## 2016-06-24 NOTE — Assessment & Plan Note (Signed)
BP Readings from Last 3 Encounters:  06/24/16 (!) 162/89  06/19/16 107/61  05/28/16 (!) 163/78   She was having high blood pressure, unable to assess whether this is medication failure or that was because of her extreme pain. She states that she take her medicines regularly, but crying during visit and aren't able to provide proper history.  Continue with the same management of lisinopril 10 mg daily. Reassess during next visit.

## 2016-06-24 NOTE — Assessment & Plan Note (Signed)
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 See pictures in the exam section above.  She had multiple malodorous wound with exposed tendon, fascia and subcutaneous fat covering most of her posterior right calf.   During her current hospital admission the internal medicine team consulted with [name] from orthopedic surgery,unfortunately because of her ongoing cocaine addiction the risks of amputation of the leg outweighed the benefits and we were left with conservative management of these aggressive wounds.  She was given a prescription of tramadol for pain today. Referred to wound care clinic for further management of her wounds, I don't think she can take care of them herself at home.

## 2016-06-24 NOTE — Telephone Encounter (Signed)
APT. REMINDER CALL, NO ANSWER, VOICEMAIL FULL

## 2016-06-25 ENCOUNTER — Ambulatory Visit: Payer: Self-pay

## 2016-06-26 NOTE — Progress Notes (Signed)
Internal Medicine Clinic Attending  I saw and evaluated the patient.  I personally confirmed the key portions of the history and exam documented by Dr. Amin and I reviewed pertinent patient test results.  The assessment, diagnosis, and plan were formulated together and I agree with the documentation in the resident's note. 

## 2016-06-29 ENCOUNTER — Ambulatory Visit (INDEPENDENT_AMBULATORY_CARE_PROVIDER_SITE_OTHER): Payer: Medicaid Other | Admitting: Internal Medicine

## 2016-06-29 VITALS — BP 145/80 | HR 95 | Temp 98.1°F | Ht 61.5 in | Wt 90.4 lb

## 2016-06-29 DIAGNOSIS — L958 Other vasculitis limited to the skin: Secondary | ICD-10-CM

## 2016-06-29 DIAGNOSIS — Z5189 Encounter for other specified aftercare: Secondary | ICD-10-CM

## 2016-06-29 DIAGNOSIS — F14288 Cocaine dependence with other cocaine-induced disorder: Secondary | ICD-10-CM | POA: Diagnosis not present

## 2016-06-29 NOTE — Progress Notes (Signed)
   CC: For follow-up of her leg wounds.  HPI:  [name] is a 55 y.o. 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 She was also seen in the clinic last week and we provided the wound care. See previous notes. She was stating that she is telling better today, her pain is decreased.  She had a wound care clinic appointment coming on October 2. She came to the clinic for change of her dressing. She had her last cocaine use 2 days ago. She wants to explore her options to go to a rehabilitation facility versus skilled nursing home facility. So she can stay away from cocaine and get wound care too.  Past Medical History:  Diagnosis Date  . CAP (community acquired pneumonia) 03/2014 X 2  . Cocaine abuse    ongoing with resultant vaculitis.  . Daily headache   . Depression   . Hypertension   . Inflammatory arthritis ([place])   . Migraines    "probably 5-6/yr" (04/21/2014)  . Normocytic anemia    BL Hgb 9.8-12. Last anemia panel 04/2010 - showing Fe 19, ferritin 101.  Pt on monthly B12 injections  . Rheumatoid arthritis(714.0)    patient reported  . 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 Followed by Dr. Louanne Skye    Review of Systems:  As per HPI.  Physical Exam:  Vitals:   06/29/16 1350  BP: (!) 145/80  Pulse: 95  Temp: 98.1 F (36.7 C)  TempSrc: Oral  SpO2: 100%  Weight: 90 lb 6.4 oz (41 kg)  Height: 5' 1.5" (1.562 m)   Gen. Lean, emaciated lady. Crying with pain. Extremities. Multiple malodorous wounds, with exposed tendon, fascia, and subcutaneous fat covering approximately  whole  of posterior R leg.   Smaller wounds of similar character on medial R foot over head of 1st metatarsal and on medial L ankle about 3x3 cm in size. No change as compare to her previous exam.  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 No discharge.  Numerous hypopigmented scars with hyperpigmented marginsin all extremities, and on her nose and ears.  Lungs. Clear bilaterally CV. RRR.  Assessment & Plan:   See Encounters Tab for problem based charting.  Patient seen with Dr. Lynnae January.

## 2016-06-29 NOTE — Assessment & Plan Note (Signed)
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  On exam.She had multiple malodorous wound with exposed tendon, fascia and subcutaneous fat covering most of her posterior right calf. Her wounds was looking the same as compare to the previous visit last week.  She had a wound care clinic appointment coming on October 2.   We provided her with a new dressing. She should follow-up at wound clinic for further management.  She wants to explore her options to go to a rehabilitation facility versus skilled nursing home facility. So she can stay away from cocaine and get wound care too. We will consult our social worker Sima Matas regarding her options. She is still using cocaine. She needs amputation for these wounds, but that cannot be done until she will stop using cocaine.

## 2016-06-29 NOTE — Patient Instructions (Signed)
Thank you for visiting clinic today. You had your wound care appointment on October 2. You don't need to come to the clinic for your wound care needs anymore. I will talk with our social worker tomorrow to check the possibilities about your placement in some sort of rehabilitation program or skilled nursing home. We will call you once find any opportunity. Please follow-up with wound care clinic as scheduled. Stay away from cocaine, as we have discussed that can take your life at this point.

## 2016-06-30 ENCOUNTER — Telehealth: Payer: Self-pay | Admitting: Licensed Clinical Social Worker

## 2016-06-30 NOTE — Telephone Encounter (Signed)
CSW placed called to pt.  CSW left message requesting return call. CSW provided contact hours and phone number.  Ms. Brunelle was referred to CSW by Dr. Reesa Chew for inpatient detox/residential treatment programs.  During pt's appointment on 06/29/16, Ms. Ruehl expressed desire to go to a residential program stating she could not stay away from current drug use.  CSW placed call to [facility] to inquire about inpatient detox program.  Pt's insurance does not cover inpatient detox at this facility and CSW was referred to [facility], [place].  CSW contacted [employer]; however, due to patient having open wounds patient is excluded from this program.  Agency offered [facility] as a possibility.  Call placed to [facility].  Request to fax chart information regarding wounds to shift leader.  Agency will have RN shift leader review notes and determine if pt is appropriate for this facility.  CSW faxed d/c summary,  2 recent office notes, and demographics.

## 2016-07-01 NOTE — Progress Notes (Signed)
Internal Medicine Clinic Attending  I saw and evaluated the patient.  I personally confirmed the key portions of the history and exam documented by Dr. Reesa Chew and I reviewed pertinent patient test results.  The assessment, diagnosis, and plan were formulated together and I agree with the documentation in the resident's note. [name] requests a different living environment so that she is removed from influences leading to cocaine use. She states that her current living arrangements is safe. She states that she does need food and [name] got her some food from our food pantry.

## 2016-07-01 NOTE — Telephone Encounter (Signed)
[name] returned call to [facility].  Pt states she is still interested in inpatient detox.  CSW informed [name] in [place] and that they are reviewing her chart to determine appropriateness.  Pt agreeable for CSW to mail information about program and upcoming appointments.

## 2016-07-06 ENCOUNTER — Encounter (HOSPITAL_BASED_OUTPATIENT_CLINIC_OR_DEPARTMENT_OTHER): Payer: Medicaid Other

## 2016-07-13 ENCOUNTER — Encounter (HOSPITAL_BASED_OUTPATIENT_CLINIC_OR_DEPARTMENT_OTHER): Payer: Medicaid Other | Attending: Internal Medicine

## 2016-07-13 DIAGNOSIS — L97212 Non-pressure chronic ulcer of right calf with fat layer exposed: Secondary | ICD-10-CM | POA: Insufficient documentation

## 2016-07-13 DIAGNOSIS — I10 Essential (primary) hypertension: Secondary | ICD-10-CM | POA: Diagnosis not present

## 2016-07-13 DIAGNOSIS — M069 Rheumatoid arthritis, unspecified: Secondary | ICD-10-CM | POA: Diagnosis not present

## 2016-07-13 DIAGNOSIS — L97511 Non-pressure chronic ulcer of other part of right foot limited to breakdown of skin: Secondary | ICD-10-CM | POA: Diagnosis not present

## 2016-07-13 DIAGNOSIS — L97822 Non-pressure chronic ulcer of other part of left lower leg with fat layer exposed: Secondary | ICD-10-CM | POA: Diagnosis not present

## 2016-07-20 DIAGNOSIS — L97212 Non-pressure chronic ulcer of right calf with fat layer exposed: Secondary | ICD-10-CM | POA: Diagnosis not present

## 2016-07-25 ENCOUNTER — Inpatient Hospital Stay (HOSPITAL_COMMUNITY): Payer: Medicaid Other

## 2016-07-25 ENCOUNTER — Emergency Department (HOSPITAL_COMMUNITY): Payer: Medicaid Other

## 2016-07-25 ENCOUNTER — Inpatient Hospital Stay (HOSPITAL_COMMUNITY)
Admission: EM | Admit: 2016-07-25 | Discharge: 2016-07-29 | DRG: 592 | Disposition: A | Discharge status: Home / Self Care | Payer: Medicaid Other | Attending: Internal Medicine | Admitting: Internal Medicine

## 2016-07-25 DIAGNOSIS — M609 Myositis, unspecified: Secondary | ICD-10-CM | POA: Diagnosis present

## 2016-07-25 DIAGNOSIS — I776 Arteritis, unspecified: Secondary | ICD-10-CM | POA: Diagnosis present

## 2016-07-25 DIAGNOSIS — Z811 Family history of alcohol abuse and dependence: Secondary | ICD-10-CM

## 2016-07-25 DIAGNOSIS — G43909 Migraine, unspecified, not intractable, without status migrainosus: Secondary | ICD-10-CM | POA: Diagnosis present

## 2016-07-25 DIAGNOSIS — F14988 Cocaine use, unspecified with other cocaine-induced disorder: Secondary | ICD-10-CM | POA: Diagnosis present

## 2016-07-25 DIAGNOSIS — F17210 Nicotine dependence, cigarettes, uncomplicated: Secondary | ICD-10-CM | POA: Diagnosis present

## 2016-07-25 DIAGNOSIS — E43 Unspecified severe protein-calorie malnutrition: Secondary | ICD-10-CM | POA: Diagnosis present

## 2016-07-25 DIAGNOSIS — Z89021 Acquired absence of right finger(s): Secondary | ICD-10-CM | POA: Diagnosis not present

## 2016-07-25 DIAGNOSIS — Z515 Encounter for palliative care: Secondary | ICD-10-CM | POA: Diagnosis not present

## 2016-07-25 DIAGNOSIS — K5900 Constipation, unspecified: Secondary | ICD-10-CM | POA: Diagnosis present

## 2016-07-25 DIAGNOSIS — M1990 Unspecified osteoarthritis, unspecified site: Secondary | ICD-10-CM | POA: Diagnosis present

## 2016-07-25 DIAGNOSIS — M79604 Pain in right leg: Secondary | ICD-10-CM

## 2016-07-25 DIAGNOSIS — L97509 Non-pressure chronic ulcer of other part of unspecified foot with unspecified severity: Secondary | ICD-10-CM | POA: Diagnosis present

## 2016-07-25 DIAGNOSIS — F1420 Cocaine dependence, uncomplicated: Secondary | ICD-10-CM | POA: Diagnosis present

## 2016-07-25 DIAGNOSIS — S81802A Unspecified open wound, left lower leg, initial encounter: Secondary | ICD-10-CM | POA: Diagnosis present

## 2016-07-25 DIAGNOSIS — Z89022 Acquired absence of left finger(s): Secondary | ICD-10-CM | POA: Diagnosis not present

## 2016-07-25 DIAGNOSIS — M868X6 Other osteomyelitis, lower leg: Secondary | ICD-10-CM | POA: Diagnosis not present

## 2016-07-25 DIAGNOSIS — I999 Unspecified disorder of circulatory system: Secondary | ICD-10-CM

## 2016-07-25 DIAGNOSIS — F339 Major depressive disorder, recurrent, unspecified: Secondary | ICD-10-CM | POA: Diagnosis present

## 2016-07-25 DIAGNOSIS — Z800 Family history of malignant neoplasm of digestive organs: Secondary | ICD-10-CM

## 2016-07-25 DIAGNOSIS — F1423 Cocaine dependence with withdrawal: Secondary | ICD-10-CM | POA: Diagnosis present

## 2016-07-25 DIAGNOSIS — Z803 Family history of malignant neoplasm of breast: Secondary | ICD-10-CM

## 2016-07-25 DIAGNOSIS — L97903 Non-pressure chronic ulcer of unspecified part of unspecified lower leg with necrosis of muscle: Secondary | ICD-10-CM | POA: Diagnosis present

## 2016-07-25 DIAGNOSIS — T405X5A Adverse effect of cocaine, initial encounter: Secondary | ICD-10-CM | POA: Diagnosis present

## 2016-07-25 DIAGNOSIS — M86671 Other chronic osteomyelitis, right ankle and foot: Secondary | ICD-10-CM | POA: Diagnosis present

## 2016-07-25 DIAGNOSIS — T148XXA Other injury of unspecified body region, initial encounter: Secondary | ICD-10-CM | POA: Insufficient documentation

## 2016-07-25 DIAGNOSIS — L97322 Non-pressure chronic ulcer of left ankle with fat layer exposed: Secondary | ICD-10-CM | POA: Diagnosis present

## 2016-07-25 DIAGNOSIS — Z886 Allergy status to analgesic agent status: Secondary | ICD-10-CM

## 2016-07-25 DIAGNOSIS — Z681 Body mass index (BMI) 19 or less, adult: Secondary | ICD-10-CM | POA: Diagnosis not present

## 2016-07-25 DIAGNOSIS — N179 Acute kidney failure, unspecified: Secondary | ICD-10-CM | POA: Diagnosis present

## 2016-07-25 DIAGNOSIS — I10 Essential (primary) hypertension: Secondary | ICD-10-CM | POA: Diagnosis present

## 2016-07-25 DIAGNOSIS — L97213 Non-pressure chronic ulcer of right calf with necrosis of muscle: Secondary | ICD-10-CM | POA: Diagnosis present

## 2016-07-25 DIAGNOSIS — M869 Osteomyelitis, unspecified: Secondary | ICD-10-CM | POA: Diagnosis present

## 2016-07-25 DIAGNOSIS — F14288 Cocaine dependence with other cocaine-induced disorder: Secondary | ICD-10-CM | POA: Diagnosis not present

## 2016-07-25 DIAGNOSIS — M86171 Other acute osteomyelitis, right ankle and foot: Secondary | ICD-10-CM | POA: Diagnosis not present

## 2016-07-25 DIAGNOSIS — Z79899 Other long term (current) drug therapy: Secondary | ICD-10-CM

## 2016-07-25 DIAGNOSIS — L97512 Non-pressure chronic ulcer of other part of right foot with fat layer exposed: Secondary | ICD-10-CM | POA: Diagnosis present

## 2016-07-25 DIAGNOSIS — L958 Other vasculitis limited to the skin: Secondary | ICD-10-CM | POA: Diagnosis not present

## 2016-07-25 DIAGNOSIS — L24A9 Irritant contact dermatitis due friction or contact with other specified body fluids: Secondary | ICD-10-CM | POA: Insufficient documentation

## 2016-07-25 DIAGNOSIS — Z720 Tobacco use: Secondary | ICD-10-CM | POA: Diagnosis present

## 2016-07-25 DIAGNOSIS — B9689 Other specified bacterial agents as the cause of diseases classified elsewhere: Secondary | ICD-10-CM | POA: Diagnosis not present

## 2016-07-25 HISTORY — DX: Headache, unspecified: R51.9

## 2016-07-25 HISTORY — DX: Headache: R51

## 2016-07-25 LAB — CK: CK TOTAL: 27 U/L — AB (ref 38–234)

## 2016-07-25 LAB — RAPID URINE DRUG SCREEN, HOSP PERFORMED
AMPHETAMINES: NOT DETECTED
BENZODIAZEPINES: NOT DETECTED
Barbiturates: NOT DETECTED
Cocaine: POSITIVE — AB
Opiates: POSITIVE — AB
TETRAHYDROCANNABINOL: POSITIVE — AB

## 2016-07-25 LAB — CBC WITH DIFFERENTIAL/PLATELET
BASOS PCT: 0 %
Basophils Absolute: 0 10*3/uL (ref 0.0–0.1)
EOS ABS: 0 10*3/uL (ref 0.0–0.7)
Eosinophils Relative: 0 %
HCT: 39.5 % (ref 36.0–46.0)
HEMOGLOBIN: 13.2 g/dL (ref 12.0–15.0)
LYMPHS ABS: 1.9 10*3/uL (ref 0.7–4.0)
Lymphocytes Relative: 20 %
MCH: 29.7 pg (ref 26.0–34.0)
MCHC: 33.4 g/dL (ref 30.0–36.0)
MCV: 88.8 fL (ref 78.0–100.0)
Monocytes Absolute: 0.6 10*3/uL (ref 0.1–1.0)
Monocytes Relative: 6 %
NEUTROS PCT: 74 %
Neutro Abs: 7.2 10*3/uL (ref 1.7–7.7)
Platelets: 317 10*3/uL (ref 150–400)
RBC: 4.45 MIL/uL (ref 3.87–5.11)
RDW: 14.9 % (ref 11.5–15.5)
WBC: 9.7 10*3/uL (ref 4.0–10.5)

## 2016-07-25 LAB — COMPREHENSIVE METABOLIC PANEL
ALT: 9 U/L — AB (ref 14–54)
AST: 13 U/L — ABNORMAL LOW (ref 15–41)
Albumin: 3.5 g/dL (ref 3.5–5.0)
Alkaline Phosphatase: 69 U/L (ref 38–126)
Anion gap: 13 (ref 5–15)
BUN: 12 mg/dL (ref 6–20)
CALCIUM: 9.3 mg/dL (ref 8.9–10.3)
CHLORIDE: 105 mmol/L (ref 101–111)
CO2: 20 mmol/L — ABNORMAL LOW (ref 22–32)
CREATININE: 1.17 mg/dL — AB (ref 0.44–1.00)
GFR, EST NON AFRICAN AMERICAN: 52 mL/min — AB (ref >60)
Glucose, Bld: 83 mg/dL (ref 65–99)
Potassium: 4 mmol/L (ref 3.5–5.1)
Sodium: 138 mmol/L (ref 135–145)
Total Bilirubin: 1 mg/dL (ref 0.3–1.2)
Total Protein: 7.4 g/dL (ref 6.5–8.1)

## 2016-07-25 LAB — URINALYSIS, ROUTINE W REFLEX MICROSCOPIC
Bilirubin Urine: NEGATIVE
Glucose, UA: 250 mg/dL — AB
Hgb urine dipstick: NEGATIVE
Ketones, ur: 15 mg/dL — AB
LEUKOCYTES UA: NEGATIVE
NITRITE: NEGATIVE
Protein, ur: NEGATIVE mg/dL
SPECIFIC GRAVITY, URINE: 1.026 (ref 1.005–1.030)
pH: 6 (ref 5.0–8.0)

## 2016-07-25 LAB — SEDIMENTATION RATE: SED RATE: 64 mm/h — AB (ref 0–22)

## 2016-07-25 LAB — I-STAT CG4 LACTIC ACID, ED: LACTIC ACID, VENOUS: 1.25 mmol/L (ref 0.5–1.9)

## 2016-07-25 MED ORDER — VANCOMYCIN HCL IN DEXTROSE 1-5 GM/200ML-% IV SOLN
1000.0000 mg | Freq: Once | INTRAVENOUS | Status: AC
  Administered 2016-07-25: 1000 mg via INTRAVENOUS
  Filled 2016-07-25: qty 200

## 2016-07-25 MED ORDER — OXYCODONE HCL 5 MG PO TABS
5.0000 mg | ORAL_TABLET | ORAL | Status: DC | PRN
  Administered 2016-07-25 – 2016-07-29 (×16): 5 mg via ORAL
  Filled 2016-07-25 (×16): qty 1

## 2016-07-25 MED ORDER — PIPERACILLIN-TAZOBACTAM 3.375 G IVPB
3.3750 g | Freq: Three times a day (TID) | INTRAVENOUS | Status: DC
  Administered 2016-07-25 – 2016-07-28 (×9): 3.375 g via INTRAVENOUS
  Filled 2016-07-25 (×11): qty 50

## 2016-07-25 MED ORDER — SODIUM CHLORIDE 0.9 % IV SOLN
1000.0000 mL | INTRAVENOUS | Status: DC
  Administered 2016-07-25 (×2): 1000 mL via INTRAVENOUS

## 2016-07-25 MED ORDER — OXYCODONE-ACETAMINOPHEN 5-325 MG PO TABS: 1.0000 | ORAL_TABLET | ORAL | Status: DC | PRN

## 2016-07-25 MED ORDER — GADOBENATE DIMEGLUMINE 529 MG/ML IV SOLN
8.0000 mL | Freq: Once | INTRAVENOUS | Status: AC | PRN
Start: 2016-07-25 — End: 2016-07-25
  Administered 2016-07-25: 8 mL via INTRAVENOUS

## 2016-07-25 MED ORDER — ENOXAPARIN SODIUM 30 MG/0.3ML ~~LOC~~ SOLN
20.0000 mg | SUBCUTANEOUS | Status: DC
  Administered 2016-07-25 – 2016-07-29 (×5): 20 mg via SUBCUTANEOUS
  Filled 2016-07-25: qty 0.3
  Filled 2016-07-25 (×3): qty 0.2
  Filled 2016-07-25: qty 0.3
  Filled 2016-07-25 (×3): qty 0.2
  Filled 2016-07-25 (×2): qty 0.3
  Filled 2016-07-25: qty 0.2

## 2016-07-25 MED ORDER — BOOST / RESOURCE BREEZE PO LIQD
1.0000 | Freq: Three times a day (TID) | ORAL | Status: DC
  Administered 2016-07-25 – 2016-07-28 (×12): 1 via ORAL
  Administered 2016-07-29: 09:00:00 via ORAL

## 2016-07-25 MED ORDER — HYDROMORPHONE HCL 2 MG/ML IJ SOLN
1.0000 mg | Freq: Once | INTRAMUSCULAR | Status: AC
  Administered 2016-07-25: 1 mg via INTRAVENOUS
  Filled 2016-07-25: qty 1

## 2016-07-25 MED ORDER — HYDROMORPHONE HCL 2 MG/ML IJ SOLN
1.0000 mg | INTRAMUSCULAR | Status: DC | PRN
  Administered 2016-07-25 – 2016-07-28 (×15): 1 mg via INTRAVENOUS
  Filled 2016-07-25 (×15): qty 1

## 2016-07-25 MED ORDER — VANCOMYCIN HCL IN DEXTROSE 750-5 MG/150ML-% IV SOLN
750.0000 mg | INTRAVENOUS | Status: DC
  Administered 2016-07-26 – 2016-07-28 (×3): 750 mg via INTRAVENOUS
  Filled 2016-07-25 (×3): qty 150

## 2016-07-25 MED ORDER — PIPERACILLIN-TAZOBACTAM 3.375 G IVPB 30 MIN
3.3750 g | Freq: Once | INTRAVENOUS | Status: AC
  Administered 2016-07-25: 3.375 g via INTRAVENOUS
  Filled 2016-07-25: qty 50

## 2016-07-25 NOTE — H&P (Signed)
Date: 07/25/2016               Patient Name:  [name] MRN: [id number]  DOB: 1960-10-27 Age / Sex: 55 y.o., female   PCP: Zada Finders, MD         Medical Service: Internal Medicine Teaching Service         Attending Physician: Dr. Aldine Contes, MD    First Contact: Dr. Reesa Chew Pager: [phone]  Second Contact: Dr. 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  She states that she first noticed the odor 3-4 days ago.  She had seen wound care on 10/16.  She has been to wound care 2-3 times in the past month.  She states she continues to use crack-cocaine, last time was about 1 week ago.  She denies fever or chills, nausea or vomiting, chest pain, shortness of breath.  [name] has had multiple previous admissions due to levimasole vasculitis with associated wounds.  She was admitted in 7/17 for necrotic ulcers of her fingers and lower extremities treated with antibiotics.  She was readmitted on 8/17 and had her left 3rd finger amputeated.  Her last admission was on 9/17 because finger amputation dehisced and her chronic leg wounds had become purulent.    Meds:  No outpatient prescriptions have been marked as taking for the 07/25/16 encounter [facility] Encounter).     Allergies: Allergies as of 07/25/2016 - Review Complete 07/25/2016  Allergen Reaction Noted  . Acetaminophen Swelling and Other (See Comments)    Past Medical History:  Diagnosis Date  . CAP (community acquired pneumonia) 03/2014 X 2  . Cocaine abuse    ongoing with resultant vaculitis.  . Daily headache   . Depression   . Hypertension   .  Inflammatory arthritis   . Migraines    "probably 5-6/yr" (04/21/2014)  . Normocytic anemia    BL Hgb 9.8-12. Last anemia panel 04/2010 - showing Fe 19, ferritin 101.  Pt on monthly B12 injections  . Rheumatoid arthritis(714.0)    patient reported  . 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 Vasculitis ([place])    2/2 Levimasole toxicity. Followed by Dr. Louanne Skye    Family History:  Family History  Problem Relation Age of Onset  . Breast cancer Mother     Breast cancer  . Alcohol abuse Mother   . Colon cancer Maternal Aunt 37  . Alcohol abuse Father      Social History: Tobacco use: 1-2 cigarettes a day. Alcohol use:  Denies Illicit drug use:  States she has smoked crack cocaine for a long time, last use was one week ago.  Review of Systems: A complete ROS was negative except as per HPI.   Physical Exam: Blood pressure 143/84, pulse 87, temperature 98.1 F (36.7 C), temperature source Oral, resp. rate 24, height 5' 1.5" (1.562 m), weight 90 lb (40.8 kg), SpO2 99 %. Vitals:   07/25/16 0215 07/25/16 0230 07/25/16 0245 07/25/16 0315  BP: 147/83 151/85 156/85 143/84  Pulse: 92 91 95 87  Resp: 15  15 16 24   Temp:      TempSrc:      SpO2: 99% 99% 100% 99%  Weight:      Height:       General: Vital signs reviewed.  Patient is thin, in no acute distress and cooperative with exam.  Head: Normocephalic and atraumatic. Eyes: EOMI, conjunctivae normal, no scleral icterus.  Neck: Supple, trachea midline, normal ROM Cardiovascular: RRR, S1 normal, S2 normal, no murmurs, gallops, or rubs. Pulmonary/Chest: Clear to auscultation bilaterally, no wheezes, rales, or rhonchi. Abdominal: Soft, non-tender, non-distended, BS +, no masses, organomegaly, or guarding present.  Musculoskeletal: No joint deformities, erythema, or stiffness, ROM full and nontender. Extremities: Multiple malodorus wounds. Non purulent posterior right lower leg with tendon and muscle  exposure and necrotic tissue, left medial malleous and right medial 1st metatarsal open wound.  Psychiatric: Normal mood and affect. speech and behavior is normal. Cognition and memory are normal.   BMET    Component Value Date/Time   NA 138 07/25/2016 0041   NA 144 11/20/2015 1507   K 4.0 07/25/2016 0041   CL 105 07/25/2016 0041   CO2 20 (L) 07/25/2016 0041   GLUCOSE 83 07/25/2016 0041   BUN 12 07/25/2016 0041   BUN 11 11/20/2015 1507   CREATININE 1.17 (H) 07/25/2016 0041   CREATININE 0.66 09/24/2014 1512   CALCIUM 9.3 07/25/2016 0041   GFRNONAA 52 (L) 07/25/2016 0041   GFRNONAA >89 09/24/2014 1512   GFRAA >60 07/25/2016 0041   GFRAA >89 09/24/2014 1512   CBC    Component Value Date/Time   WBC 9.7 07/25/2016 0041   RBC 4.45 07/25/2016 0041   HGB 13.2 07/25/2016 0041   HCT 39.5 07/25/2016 0041   PLT 317 07/25/2016 0041   MCV 88.8 07/25/2016 0041   MCH 29.7 07/25/2016 0041   MCHC 33.4 07/25/2016 0041   RDW 14.9 07/25/2016 0041   LYMPHSABS 1.9 07/25/2016 0041   MONOABS 0.6 07/25/2016 0041   EOSABS 0.0 07/25/2016 0041   BASOSABS 0.0 07/25/2016 0041    EKG: none  X ray of Tibia/Fibula IMPRESSION: No evidence of fracture or dislocation. No definite osseous erosions seen. Very large soft tissue wound at the right posterior lower leg.  Assessment & Plan by Problem:  Chronic vascultic wound of left leg Patient was recently admitted on 06/14/16 for her leg wounds and was treated with empiric antibiotics. During that admission her worst wound was on the posterior right calf with exposed fascia and tendon. Orthopedic surgery was consulted however, due to ongoing cocaine use the amputation of the leg outweighed the benefits.  During that admission she never became septic and blood cultures were negative.  She was discharged with a 2 week course of bactrim which she reports finishing.  She has been following with wound care and last appointment was 5 days ago.  Today she  presents with increased pain in her posterior right lower leg.  She is afebrile and sed rate is 64 compared to 70 on previous admission. Lactic acid is not elevated.  There are multiple malodorous wounds without purulent drainage.  Upon comparison of pictures from previous admission on 9/10 and today, wound appears stable with more necrotic tissue visible.  She may benefit from another orthopedic consult to reassess options   - Vancomycin and Zosyn  - Blood culture - MRI of left foot and right leg - wound care - CK - Consider orthopedic consult - Dilaudid 54m QH4 PRN and Oxycodone [id number]  Acute Kidney Injury Creatinine is 1.17, increased from her baseline of about 0.7.  Likely due to poor oral intake - IV NS 13m/hr  Hypertension Blood pressures are elevated.  Her medications include llisinopril 172mdaily.  Will hold in the setting of AKI. - holding lisinopril    Dispo: Admit patient to Observation with expected length of stay less than 2 midnights.  Signed: JeValinda PartyDO 07/25/2016, 4:20 AM  Pager: [phone]

## 2016-07-25 NOTE — Progress Notes (Signed)
Pharmacy Antibiotic Note  [name] is a 55 y.o. female admitted on 07/25/2016 with chronic vasculitis L leg wound.  Pharmacy has been consulted for Vancomycin and Zosyn dosing.  Zosyn 3.375gm given ~0145 and Vanc 1gm given ~0245 in ED  Plan: Zosyn 3.375gm IV q8h - doses over 4 hours Vancomycin 769m IV q24h Will f/u micro data, renal function, and pt's clinical condition Vanc trough prn   Height: 5' 1.5" (156.2 cm) Weight: 90 lb (40.8 kg) IBW/kg (Calculated) : 48.95  Temp (24hrs), Avg:98.1 F (36.7 C), Min:98.1 F (36.7 C), Max:98.1 F (36.7 C)   Recent Labs Lab 07/25/16 0041 07/25/16 0105  WBC 9.7  --   CREATININE 1.17*  --   LATICACIDVEN  --  1.25    Estimated Creatinine Clearance: 35.8 mL/min (by C-G formula based on SCr of 1.17 mg/dL (H)).    Allergies  Allergen Reactions  . Acetaminophen Swelling and Other (See Comments)    Reaction:  Eyelid swelling    Antimicrobials this admission: 10/21 Zosyn >>  10/21 Vanc >>   Dose adjustments this admission: n/a  Microbiology results: 10/21 BCx x2:   Thank you for allowing pharmacy to be a part of this patient's care.  CSherlon Handing PharmD, BCPS Clinical pharmacist, pager [phone]/21/2017 4:39 AM

## 2016-07-25 NOTE — Progress Notes (Signed)
Subjective: Patient seen and examined this morning on rounds.  No acute events since admission.  She ate some breakfast this morning and tolerated it well.  Pain is currently being controlled.  She had one episode of non-bloody emesis per her report but currently has no nausea.  She has been ambulating at home with a rolling walker.  Purulent drainage has improved but malodorous nature is what prompted her to seek re-evaluation leading up to admission.  Objective:  Vital signs in last 24 hours: Vitals:   07/25/16 0315 07/25/16 0330 07/25/16 0415 07/25/16 0515  BP: 143/84 132/79 153/85 (!) 142/66  Pulse: 87 88 82 77  Resp: 24 (!) 29 17   Temp:    98.2 F (36.8 C)  TempSrc:    Oral  SpO2: 99% 98% 99% 100%  Weight:      Height:       General: thin, tearful, AA woman, resting in bed, not in distress HEENT: [place]/AT, EOMI, no scleral icterus Cardiac: RRR, no rubs, murmurs or gallops Pulm: clear to auscultation bilaterally, moving normal volumes of air Abd: soft, nontender, nondistended Ext: Non-purulent, malodorous posterior right lower leg wound with exposure of the tendon and muscle as well as some necrotic tissue.  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  Unfortunately, Ms. Ohlendorf remains hopelessly addicted to cocaine with positive UDS again this admission.  She has been advised  numerous times during previous admissions and outpatient follow-up to abstain from cocaine in order to have her wounds heal and potentially be a candidate for surgery.  We have contacted orthopedic surgery to make them aware of her admission, understanding that given her ongoing drug use, the management of her wounds would be unchanged.  [name] note from Sept 12, 2017 has been reviewed.  She remains afebrile, normotensive, WBC not elevated.  Her lactic acid is not elevated.  She was started on vancomycin and piperacillin-tazobactam overnight. - Continue Vancomycin and Piperacillin-Tazobactam today - Blood cultures x 2 are pending - WOC consult appreciated - MRI pending to rule out osteomyelitis - Pain control currently with Dilaudid 22m Q4H PRN and Oxycodone 530mQ4H PRN.  Taper as tolerated. Would recommend at discharge non-opiate management if possible - She may benefit from a discussion with Palliative Care as her surgical options are limited with ongoing cocaine use and non-healing wounds requiring frequent admission for pain control and intravenous antibiotics.   Acute Kidney Injury Creatinine is 1.17, increased from her baseline of about 0.7.  Likely due to poor oral intake - IV NS 12566mr - repeat BMET today  Hypertension Blood pressures are mildly elevated with most recent being 142/66.  Her medications include lisinopril 72m36mily.  Will hold in the setting of AKI. - holding lisinopril.  Consider restarting if BP becomes more elevated and renal function improves  Dispo: Anticipated discharge in approximately 2-3 day(s).   AndrJule Ser 07/25/2016, 10:25 AM Pager:  [phone]  

## 2016-07-25 NOTE — ED Provider Notes (Signed)
[facility] DEPT Provider Note   CSN: [id number] Arrival date & time: 07/25/16  0016     History   Chief Complaint Chief Complaint  Patient presents with  . Foot Pain  . Post-op Problem    HPI [name] is a 55 y.o. female with a hx of Cocaine induced vasculitis, depression, hypertension, arthritis presents to the Emergency Department complaining of gradual, persistent, progressively worsening right lower leg wound.  Patient reports that the wound has been there for some time and she is being followed by the wound care center. Her last appointment was 5 days ago. She reports that throughout the week the wound on her right leg has developed a "bad smell." She reports increased drainage from the wound, soaking her bandages which is uncommon. She denies fevers or chills. She reports a significant increase in the pain in her right lower leg. Nothing makes it better or worse. She denies headache, neck pain, chest pain, shortness of breath, abdominal pain, nausea, vomiting, diarrhea.    The history is provided by the patient and medical records. No language interpreter was used.    Past Medical History:  Diagnosis Date  . CAP (community acquired pneumonia) 03/2014 X 2  . Cocaine abuse    ongoing with resultant vaculitis.  . Daily headache   . Depression   . Hypertension   . Inflammatory arthritis   . Migraines    "probably 5-6/yr" (04/21/2014)  . Normocytic anemia    BL Hgb 9.8-12. Last anemia panel 04/2010 - showing Fe 19, ferritin 101.  Pt on monthly B12 injections  . Rheumatoid arthritis(714.0)    patient reported  . 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 Vasculitis ([place])    2/2 Levimasole toxicity. Followed by Dr. Louanne Skye    Patient Active Problem List   Diagnosis Date Noted  . Tendonitis of ankle   . Infectious myositis   . Myalgia and myositis 06/15/2016  . Wound dehiscence   . Finger necrosis ([place]) 05/18/2016  . AKI (acute kidney  injury) ([place])   . Acute renal failure (ARF) ([place]) 04/27/2016  . Healthcare maintenance 04/15/2016  . Leg ulcer ([place])   . Leg wound, left   . 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 ([place]) 06/04/2015  . Pressure ulcer 04/22/2015  . Skin necrosis ([place]) 04/21/2015  . Protein-calorie malnutrition, severe ([place]) 01/15/2015  . Swelling of finger, right 01/14/2015  . Foot ulceration ([place]) 01/14/2015  . Severe recurrent major depression without psychotic features ([place]) 10/16/2014  . Cocaine use disorder, severe, dependence ([place]) 10/10/2014  . Cannabis use disorder, severe, dependence ([place]) 10/10/2014  . Substance or medication-induced depressive disorder with onset during intoxication ([place])   . Substance induced mood disorder ([place]) 10/09/2014  . Major depressive disorder, single episode, severe without psychotic features ([place])   . 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 Vasculitis ([place]) 04/19/2014  . Hepatomegaly 04/11/2014  . Acute pericarditis 03/31/2014  . Major depression, recurrent ([place]) 01/13/2014  . Inflammatory arthritis 09/24/2013  . 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 Cocaine dependence ([place]) 05/15/2013  . Tobacco abuse 01/03/2013  . hyperlipidemia 01/03/2013  . Essential hypertension 02/26/2010    Past Surgical History:  Procedure Laterality Date  . AMPUTATION Left 05/22/2016   Procedure: AMPUTATION LEFT LONG FINGER;  Surgeon: Marybelle Killings, MD;  Location: [facility];  Service: Orthopedics;  Laterality: Left;  . HERNIA REPAIR     "stomach"  . I&D EXTREMITY Right  09/26/2015   Procedure: IRRIGATION AND DEBRIDEMENT LEG WOUND  VAC PLACEMENT.;  Surgeon: Loel Lofty Dillingham, DO;  Location: [facility];  Service: Plastics;  Laterality: Right;  . IRRIGATION AND DEBRIDEMENT ABSCESS Bilateral 09/26/2013   Procedure: DEBRIDEMENT ULCERS BILATERAL THIGHS;  Surgeon: Gwenyth Ober, MD;  Location: [facility];  Service: General;  Laterality: Bilateral;  . SKIN BIOPSY Bilateral 04/2010   shin  nodules    OB History    No data available       Home Medications    Prior to Admission medications   Medication Sig Start Date End Date Taking? Authorizing Provider  feeding supplement (BOOST / RESOURCE BREEZE) LIQD Take 1 Container by mouth 3 (three) times daily between meals. Patient not taking: Reported on 07/25/2016 05/01/16   [name], MD  lisinopril (PRINIVIL,ZESTRIL) 10 MG tablet Take 1 tablet (10 mg total) by mouth daily. Patient not taking: Reported on 07/25/2016 05/28/16   Bethany Molt, DO  metroNIDAZOLE (METROGEL) 0.75 % gel Apply 1 application topically every morning. Patient not taking: Reported on 07/25/2016 05/23/16   Riccardo Dubin, MD  oxyCODONE (ROXICODONE) 5 MG immediate release tablet Take 1 tablet (5 mg total) by mouth every 6 (six) hours as needed for severe pain. Patient not taking: Reported on 07/25/2016 06/19/16   Minus Liberty, MD  sulfamethoxazole-trimethoprim (BACTRIM DS,SEPTRA DS) 800-160 MG tablet Take 1 tablet by mouth every 12 (twelve) hours. Patient not taking: Reported on 07/25/2016 06/19/16   Minus Liberty, MD  traMADol (ULTRAM) 50 MG tablet Take 1 tablet (50 mg total) by mouth every 6 (six) hours as needed. Patient not taking: Reported on 07/25/2016 06/24/16 06/24/17  Lorella Nimrod, MD    Family History Family History  Problem Relation Age of Onset  . Breast cancer Mother     Breast cancer  . Alcohol abuse Mother   . Colon cancer Maternal Aunt 27  . Alcohol abuse Father     Social History Social History  Substance Use Topics  . Smoking status: Current Every Day Smoker    Packs/day: 0.30    Years: 34.00    Types: Cigarettes  . Smokeless tobacco: Never Used     Comment: "Trying very hard". / 2-3 CIGARETTES A DAY  . Alcohol use No     Allergies   Acetaminophen   Review of Systems Review of Systems  Musculoskeletal: Positive for arthralgias.  Skin: Positive for wound.  All other systems reviewed and are  negative.    Physical Exam Updated Vital Signs BP 176/90   Pulse 91   Temp 98.1 F (36.7 C) (Oral)   Resp 17   Ht 5' 1.5" (1.562 m)   Wt 40.8 kg   SpO2 98%   BMI 16.73 kg/m   Physical Exam  Constitutional: She appears well-developed and well-nourished. She appears distressed ( [place]ear[place]).  Awake, alert, nontoxic appearance  HENT:  Head: Normocephalic and atraumatic.  Mouth/Throat: Oropharynx is clear and moist. No oropharyngeal exudate.  Eyes: Conjunctivae are normal. No scleral icterus.  Neck: Normal range of motion. Neck supple.  Cardiovascular: Normal rate, regular rhythm and intact distal pulses.   Pulmonary/Chest: Effort normal and breath sounds normal. No respiratory distress. She has no wheezes.  Equal chest expansion  Abdominal: Soft. Bowel sounds are normal. She exhibits no mass. There is no tenderness. There is no rebound and no guarding.  Musculoskeletal: Normal range of motion. She exhibits no edema.  Neurological: She is alert.  Speech is clear and goal oriented Moves  extremities without ataxia  Skin: Skin is warm and dry. She is not diaphoretic.  Multiple wounds to the feet and leg See photos  Psychiatric: She has a normal mood and affect.  Nursing note and vitals reviewed.          ED Treatments / Results  Labs (all labs ordered are listed, but only abnormal results are displayed) Labs Reviewed  COMPREHENSIVE METABOLIC PANEL - Abnormal; Notable for the following:       Result Value   CO2 20 (*)    Creatinine, Ser 1.17 (*)    AST 13 (*)    ALT 9 (*)    GFR calc non Af Amer 52 (*)    All other components within normal limits  CULTURE, BLOOD (ROUTINE X 2)  CULTURE, BLOOD (ROUTINE X 2)  CBC WITH DIFFERENTIAL/PLATELET  RAPID URINE DRUG SCREEN, HOSP PERFORMED  URINALYSIS, ROUTINE W REFLEX MICROSCOPIC (NOT AT [facility])  SEDIMENTATION RATE  I-STAT CG4 LACTIC ACID, ED     Radiology Dg Tibia/fibula Right  Result Date: 07/25/2016 CLINICAL DATA:   Chronic large wound at the posterior calf, with necrosis. Initial encounter. EXAM: RIGHT TIBIA AND FIBULA - 2 VIEW COMPARISON:  MRI of the right tibia/fibula performed 06/14/2016 FINDINGS: There is no evidence of fracture or dislocation. No definite osseous erosions are seen. A very large soft tissue wound is noted at the right posterior lower leg. No radiopaque foreign bodies are seen. The ankle joint is grossly unremarkable in appearance. The knee joint is unremarkable in appearance. No knee joint effusion is seen. IMPRESSION: No evidence of fracture or dislocation. No definite osseous erosions seen. Very large soft tissue wound at the right posterior lower leg. Electronically Signed   By: Garald Balding M.D.   On: 07/25/2016 01:53    Procedures Procedures (including critical care time)  Medications Ordered in ED Medications  0.9 %  sodium chloride infusion (1,000 mLs Intravenous New Bag/Given 07/25/16 0143)  vancomycin (VANCOCIN) IVPB 1000 mg/200 mL premix (1,000 mg Intravenous New Bag/Given 07/25/16 0255)  HYDROmorphone (DILAUDID) injection 1 mg (1 mg Intravenous Given 07/25/16 0109)  piperacillin-tazobactam (ZOSYN) IVPB 3.375 g (3.375 g Intravenous New Bag/Given 07/25/16 0144)     Initial Impression / Assessment and Plan / ED Course  I have reviewed the triage vital signs and the nursing notes.  Pertinent labs & imaging results that were available during my care of the patient were reviewed by me and considered in my medical decision making (see chart for details).  Clinical Course  Value Comment By Time  Lactic Acid, Venous: 1.25 WNL Vanessa Kampf, PA-C 10/21 0240  CO2: (!) 20 Mildly low Keaton Stirewalt, PA-C 10/21 0240  Creatinine: (!) 1.17 elevated Taraya Steward, PA-C 10/21 0240  WBC: 9.7 No leukocytosis Abigail Butts, PA-C 10/21 0240  DG Tibia/Fibula Right No evidence of osteomyelitis  Abigail Butts, PA-C 10/21 0240   Discussed with IMTS who will admit  Abigail Butts, PA-C 10/21 0251    Patient with history of cocaine induced vasculitis and chronic wounds. Now with increased drainage and foul smell from the right lower leg wound. On exam wound is open and draining with erosion into the muscle belly and visible tendons.  Patient will need admission and for the consult. No evidence of sepsis. Normal lactic acid and patient is afebrile. Elevated creatinine, fluids given. No evidence of osteomyelitis on x-ray. ESR pending.  Final Clinical Impressions(s) / ED Diagnoses   Final diagnoses:  Wound drainage  Vasculitis ([place])  New Prescriptions New Prescriptions   No medications on file     Abigail Butts, PA-C 07/25/16 [place], MD 07/25/16 [phone]

## 2016-07-25 NOTE — ED Notes (Signed)
Patient transported to X-ray via stretcher 

## 2016-07-25 NOTE — ED Triage Notes (Signed)
Pt presents with hx of L foot surgery to place drain for fluid pump.  L foot dressing changed on Monday and has not been changed since. Pt called EMS for a foul smell and 10/10 pain to LLE.  Per EMS: BP 124/86, Pulse 90 and regular.

## 2016-07-25 NOTE — Progress Notes (Signed)
[name], female   DOB: 01-23-1961, 55 y.o.   MRN: [id number] Called about pt being admitted again. Still using cocaine. See my 06/16/16 progress note.

## 2016-07-25 NOTE — Progress Notes (Addendum)
Pt only able to complete a portion of mri tib/fib right and Left (almost all of w/o cm portion of exam ) I was 1 sequence away from starting the imaging of her right foot and she could no longer control pain. Crying and asked to finish study after she got a pain pill and got herself together,  Imaging obtained has been sent to PACS but not completed yet pending hopefully pt being able to finish exam later today. Please call with any questions. [phone]. NSG plz call when pt's pain is controlled and I'll pick up where we left off. I called the floor NSG was unable to answer at the moment.  Lynelle Doctor, RT,R,MR

## 2016-07-25 NOTE — ED Notes (Signed)
LLE dressing removed; tendon exposed in back of leg.  Dr. Ellender Hose called.

## 2016-07-26 DIAGNOSIS — M86171 Other acute osteomyelitis, right ankle and foot: Secondary | ICD-10-CM

## 2016-07-26 LAB — BASIC METABOLIC PANEL
ANION GAP: 6 (ref 5–15)
BUN: 11 mg/dL (ref 6–20)
CALCIUM: 8 mg/dL — AB (ref 8.9–10.3)
CHLORIDE: 111 mmol/L (ref 101–111)
CO2: 22 mmol/L (ref 22–32)
Creatinine, Ser: 0.95 mg/dL (ref 0.44–1.00)
GFR calc Af Amer: >60 mL/min (ref >60)
GFR calc non Af Amer: >60 mL/min (ref >60)
GLUCOSE: 106 mg/dL — AB (ref 65–99)
POTASSIUM: 3.9 mmol/L (ref 3.5–5.1)
Sodium: 139 mmol/L (ref 135–145)

## 2016-07-26 MED ORDER — LISINOPRIL 10 MG PO TABS
10.0000 mg | ORAL_TABLET | Freq: Every day | ORAL | Status: DC
  Administered 2016-07-26 – 2016-07-29 (×4): 10 mg via ORAL
  Filled 2016-07-26 (×4): qty 1

## 2016-07-26 MED ORDER — COLLAGENASE 250 UNIT/GM EX OINT
TOPICAL_OINTMENT | Freq: Every day | CUTANEOUS | Status: DC
  Administered 2016-07-26 – 2016-07-29 (×4): via TOPICAL
  Filled 2016-07-26: qty 30

## 2016-07-26 NOTE — Progress Notes (Signed)
   Subjective: Patient was feeling better this morning. Denies any pain. On questioning she admits that she had cocaine daily for her admission.  Objective:  Vital signs in last 24 hours: Vitals:   07/25/16 0515 07/25/16 1352 07/25/16 2129 07/26/16 0538  BP: (!) 142/66 134/78 126/80 (!) 168/82  Pulse: 77 90 96 87  Resp:   18 17  Temp: 98.2 F (36.8 C) 98.3 F (36.8 C) 98 F (36.7 C) 98.6 F (37 C)  TempSrc: Oral Oral Oral Oral  SpO2: 100% 100% 98% 97%  Weight:      Height:       Gen. lean lady, lying comfortably in bed, in no acute distress. Lungs. Clear bilaterally CV. Regular rate and rhythm Abdomen. Soft, nontender, nondistended, bowel sounds positive. Extremities. Malar tenderness right lower extremity wound with exposure of tendons and muscles, there was a big necrotic tissue along with some serosanguineous discharge. An other open wound left malleolus and right medial first metatarsal.        Labs. CBC Latest Ref Rng & Units 07/25/2016 06/18/2016 06/17/2016  WBC 4.0 - 10.5 K/uL 9.7 5.7 4.7  Hemoglobin 12.0 - 15.0 g/dL 13.2 9.6(L) 9.5(L)  Hematocrit 36.0 - 46.0 % 39.5 29.3(L) 28.5(L)  Platelets 150 - 400 K/uL 317 306 263   BMP Latest Ref Rng & Units 07/26/2016 07/25/2016 06/18/2016  Glucose 65 - 99 mg/dL 106(H) 83 97  BUN 6 - 20 mg/dL 11 12 13   Creatinine 0.44 - 1.00 mg/dL 0.95 1.17(H) 0.73  BUN/Creat Ratio 9 - 23 - - -  Sodium 135 - 145 mmol/L 139 138 138  Potassium 3.5 - 5.1 mmol/L 3.9 4.0 5.0  Chloride 101 - 111 mmol/L 111 105 108  CO2 22 - 32 mmol/L 22 20(L) 25  Calcium 8.9 - 10.3 mg/dL 8.0(L) 9.3 8.9   Assessment/Plan:  Ms. 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 Her recent MRI done yesterday shows osteomyelitis of right toe.  Was no osteomyelitis noted on tibia and fibula of both lower extremities. Orthopedic are not going to see as they have already told that they won't be able to help her unless she will stop using cocaine. She still uses cocaine pretty regularly. During admission she told us that she her last use was 2-3 weeks ago, when asked again this morning after telling her that her urine screen is positive for cocaine, she told me that she her last use was a day before she came to the ED. I talked with her about palliative care involvement this morning, she seems interested, not sure what are her understanding at this point. Blood culture results are still pending. -Continue vancomycin and Zosyn. -Wound care -Continue dilaudid and  oxycodone as needed. -Palliative care consult.  Acute kidney injury. Was most probably due to her poor by mouth intake. Her creatinine has improved to 0.95 this morning. -We'll continue IV normal saline today. -Encourage her to eat and drink well.  Hypertension. She was normotensive this morning, without her home lisinopril. -We will keep holding lisinopril, she can be restarted if her blood pressure started going up again.  Dispo: Anticipated discharge in approximately 2-3 day(s).   Lorella Nimrod, MD 07/26/2016, 10:47 AM Pager: [phone]

## 2016-07-26 NOTE — Progress Notes (Signed)
Wounds to bilateral lower extremities redressed this pm. Wound care nurse consult ordered

## 2016-07-27 DIAGNOSIS — L958 Other vasculitis limited to the skin: Secondary | ICD-10-CM

## 2016-07-27 DIAGNOSIS — F14288 Cocaine dependence with other cocaine-induced disorder: Secondary | ICD-10-CM

## 2016-07-27 DIAGNOSIS — I10 Essential (primary) hypertension: Secondary | ICD-10-CM

## 2016-07-27 DIAGNOSIS — M868X6 Other osteomyelitis, lower leg: Secondary | ICD-10-CM

## 2016-07-27 DIAGNOSIS — B9689 Other specified bacterial agents as the cause of diseases classified elsewhere: Secondary | ICD-10-CM

## 2016-07-27 LAB — MRSA PCR SCREENING: MRSA BY PCR: NEGATIVE

## 2016-07-27 MED ORDER — DAKINS (1/4 STRENGTH) 0.125 % EX SOLN
CUTANEOUS | Status: AC
  Administered 2016-07-27: 12:00:00
  Filled 2016-07-27: qty 473

## 2016-07-27 NOTE — Progress Notes (Signed)
Initial Nutrition Assessment  DOCUMENTATION CODES:   Severe malnutrition in context of social or environmental circumstances, Underweight  INTERVENTION:   -Continue Boost Breeze po TID, each supplement provides 250 kcal and 9 grams of protein  NUTRITION DIAGNOSIS:   Malnutrition related to social / environmental circumstances as evidenced by severe depletion of muscle mass, severe depletion of body fat.  GOAL:   Patient will meet greater than or equal to 90% of their needs  MONITOR:   PO intake, Supplement acceptance, Labs, Weight trends, Skin, I & O's  REASON FOR ASSESSMENT:   Malnutrition Screening Tool    ASSESSMENT:   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  She states that she first noticed the odor 3-4 days ago.  She had seen wound care on 10/16.  She has been to wound care 2-3 times in the past month.  She states she continues to use crack-cocaine, last time was about 1 week ago.  She denies fever or chills, nausea or vomiting, chest pain, shortness of breath.  Pt admitted with levamisole induced vasculitis.   Pt sleeping soundly at time of visit. Pt did not arise when name was called. Pt is familiar to this RD, due to multiple previous admissions.   Pt with hx of severe malnutrition of social/environemental circumstances, which is ongoing. Unable to perform nutrition-focused physical exam, however, exam recently completed on 06/16/16, which revealed severe fat and muscle depletion. This RD suspects no changes to exam at this time.   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 Per MD notes, surgical options and healing potential limited secondary to chronic drug use. Reviewed [facility] note on 07/27/16; pt with full thickness levamisole vasculitis on RLE, LLE, and rt lateral first  metatarsal.   Meal completion 75%. Pt enjoys Boost Breeze supplements, which have been ordered. Pt consumed 100% of supplement left on tray table.   Labs reviewed.   Diet Order:  Diet regular Room service appropriate? Yes; Fluid consistency: Thin  Skin:  Wound (see comment) (full thickness levamisole vasculitis RLE, LLE, rt first finger)  Last BM:  07/23/16  Height:   Ht Readings from Last 1 Encounters:  07/25/16 5' 1.5" (1.562 m)    Weight:   Wt Readings from Last 1 Encounters:  07/25/16 90 lb (40.8 kg)    Ideal Body Weight:  48.9 kg  BMI:  Body mass index is 16.73 kg/m.  Estimated Nutritional Needs:   Kcal:  1200-1400  Protein:  60-70 grams  Fluid:  1.2-1.4 L  EDUCATION NEEDS:   No education needs identified at this time  Takya Vandivier A. Jimmye Norman, RD, LDN, CDE Pager: [phone] After hours Pager: [phone]

## 2016-07-27 NOTE — Consult Note (Addendum)
[facility] Nurse wound consult note Reason for Consult:  Levamisole Vasculitis RLE  Wound type:Chronic full Thickness/Levamisole Vasculitis to right calf Measurement:12x8cm Wound bed:100% Loose necrotic, tendon exposure Drainage (amount, consistency, odor) Moderate amount purulent drainage, foul odor Periwound:Intact Dressing procedure/placement/frequency:Applied Santyl to wound bed, covered with ABD pad, secured with Kerlix and Medipore tape.    [place] Nurse wound consult note Reason for Consult:  Levamisole Vasculitis LLE Wound type:Full thickness/ Levamisole Vasculitis to left leg Measurement:2.5x2.5x0.2 Wound bed:80% red, 20% Yellow adherent slough  Drainage (amount, consistency, odor) Moderate amount purulent drainage, foul odor Periwound:Intact Dressing procedure/placement/frequency:Applied Santyl to wound bed, covered with ABD pad, secured with Kerlix and Medipore tape.   [facility] Nurse wound consult note Reason for Consult:  Levamisole Vasculitis Rt lateral 1st Metatarsal head Wound type:Full thickness/ Levamisole Vasculitis  Pressure Ulcer POA: Yes Measurement:1.5x1.5 Wound LYT:SSQS  Drainage (amount, consistency, odor) Small amount serous Periwound:Intact Dressing procedure/placement/frequency:Appiled foam border to wound. Change every 5 days, prn soilage.   Plan: Continue Santyl to left leg, Dakins to right leg to decrease odor and necrotic tissue. Right leg wound will not heal with topical treatment.  Pt has been told in the past by ortho service that she needs an amputation, but has been declined as a surgical candidate related to constantly using cocaine, according to the EMR.  Discussed plan of care with primary team.  Pt could benefit from comfort care goals with palliative care consult. Please re-consult if further assistance is needed.  Thank-you,  Julien Girt MSN, [place], [place], [place], [place]

## 2016-07-27 NOTE — Progress Notes (Signed)
   Subjective: Patient was lying comfortably in her bed this morning, she states that she is feeling better. Her pain is controlled with Dilaudid and oxycodone.  Objective:  Vital signs in last 24 hours: Vitals:   07/26/16 0538 07/26/16 1242 07/26/16 2311 07/27/16 0628  BP: (!) 168/82 (!) 161/89 (!) 161/91 139/73  Pulse: 87 93 (!) 103 90  Resp: 17 16 16 17   Temp: 98.6 F (37 C) 99.1 F (37.3 C) 99.2 F (37.3 C) 98.7 F (37.1 C)  TempSrc: Oral Oral Oral Oral  SpO2: 97% 98% 98% 100%  Weight:      Height:       Gen. lean lady, alert and oriented, in no acute distress. Extremities. Mal odorous lower extremity multiple wounds, more on right leg with exposure of tendon and muscles. No discharge noted on bandage today. An other open wound left malleolus and right medial first metatarsal. Lungs. Clear bilaterally CV. Regular rate and rhythm Abdomen. Soft, nontender, bowel sounds positive.  Labs. BMP Latest Ref Rng & Units 07/26/2016 07/25/2016 06/18/2016  Glucose 65 - 99 mg/dL 106(H) 83 97  BUN 6 - 20 mg/dL 11 12 13   Creatinine 0.44 - 1.00 mg/dL 0.95 1.17(H) 0.73  BUN/Creat Ratio 9 - 23 - - -  Sodium 135 - 145 mmol/L 139 138 138  Potassium 3.5 - 5.1 mmol/L 3.9 4.0 5.0  Chloride 101 - 111 mmol/L 111 105 108  CO2 22 - 32 mmol/L 22 20(L) 25  Calcium 8.9 - 10.3 mg/dL 8.0(L) 9.3 8.9    Assessment/Plan:  Ms. 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 She do not have much options with her concurrent cocaine abuse. -We will treat her osteomyelitis with Zosyn and vancomycin. -Wound care. -Pain control with Dilaudid and oxycodone -We will follow recommendations from palliative care.  Acute kidney injury. It has been dissolved. Most probably was due to poor by  mouth intake. -Stop IV fluid.  Hypertension. Her blood Pressure is mildly elevated. Her home dose of lisinopril 10 mg was restarted yesterday. -Monitor her blood pressure.  Dispo: Anticipated discharge in approximately 2-3 day(s).   Lorella Nimrod, MD 07/27/2016, 10:50 AM Pager: [phone]

## 2016-07-28 DIAGNOSIS — Z515 Encounter for palliative care: Secondary | ICD-10-CM

## 2016-07-28 DIAGNOSIS — I999 Unspecified disorder of circulatory system: Secondary | ICD-10-CM

## 2016-07-28 DIAGNOSIS — F14988 Cocaine use, unspecified with other cocaine-induced disorder: Secondary | ICD-10-CM

## 2016-07-28 MED ORDER — DOXYCYCLINE HYCLATE 100 MG PO TABS
100.0000 mg | ORAL_TABLET | Freq: Two times a day (BID) | ORAL | Status: DC
  Administered 2016-07-28 – 2016-07-29 (×3): 100 mg via ORAL
  Filled 2016-07-28 (×3): qty 1

## 2016-07-28 MED ORDER — HYDROXYZINE HCL 25 MG PO TABS
25.0000 mg | ORAL_TABLET | Freq: Once | ORAL | Status: AC
  Administered 2016-07-28: 25 mg via ORAL
  Filled 2016-07-28: qty 1

## 2016-07-28 MED ORDER — TRAMADOL HCL 50 MG PO TABS
50.0000 mg | ORAL_TABLET | Freq: Four times a day (QID) | ORAL | Status: DC
  Administered 2016-07-28 – 2016-07-29 (×6): 50 mg via ORAL
  Filled 2016-07-28 (×6): qty 1

## 2016-07-28 NOTE — Progress Notes (Signed)
Pt's BP at 2053  was 180/ 74, rechecked at 2200  bp 181/78. Pt denies chest pain, dizziness, headache, verbalized pain on left calf due to wound 7/10. On scheduled pain meds and prn pain meds given. Reprted to MD on call with order to further monitor.

## 2016-07-28 NOTE — Care Management Note (Signed)
Case Management Note  Patient Details  Name: MYRACLE FEBRES MRN: [id number] Date of Birth: 01-19-1961  Subjective/Objective:                    Action/Plan:  Confirmed face sheet information with patient. Discussed discharge planning . Patient does not want to go to SNF short term for wound care . Patient wants to go home. Patient states she lives at a boarding house and there is 5 or 6 people there who are willing to help . Patient states she is ambulatory as long as she has her walker to take the pressure off of her rt leg . Patient has had home health in past and does NOT want home health currently . She [facility] she [facility] .  Expected Discharge Date:                  Expected Discharge Plan:  Home/Self Care  In-House Referral:     Discharge planning Services     Post Acute Care Choice:    Choice offered to:  Patient  DME Arranged:    DME Agency:     HH Arranged:  Patient Refused [facility] Agency:     Status of Service:  Completed, signed off  If discussed at [employer] of Stay Meetings, dates discussed:    Additional Comments:  Marilu Favre, RN 07/28/2016, 2:46 PM

## 2016-07-28 NOTE — Progress Notes (Signed)
   07/28/16 1200  Clinical Encounter Type  Visited With Patient  Visit Type Initial;Spiritual support  Referral From Palliative care team  Spiritual Encounters  Spiritual Needs Prayer;Emotional  Stress Factors  Patient Stress Factors Exhausted;Health changes    Chaplain responded to [name] consult from pc. Sat with patient and provided spiritual care of prayer and reflective listening.

## 2016-07-28 NOTE — Progress Notes (Signed)
   Subjective: Patient was complaining of pain in her legs. She was little agitated with tearful eyes. We discussed with her the option of going to a skilled nursing facility for better wound care, patient states that she wants to go home.  Objective:  Vital signs in last 24 hours: Vitals:   07/27/16 0628 07/27/16 1615 07/27/16 2031 07/28/16 0610  BP: 139/73 140/75 (!) 145/55 (!) 156/72  Pulse: 90 87 88 75  Resp: 17 16 18 18   Temp: 98.7 F (37.1 C) 98.4 F (36.9 C) 98.3 F (36.8 C) 98.6 F (37 C)  TempSrc: Oral Oral Oral Oral  SpO2: 100% 100% 98% 99%  Weight:      Height:       Gen. thin lady, looks depressed with tearful eyes, little agitated, in no acute distress. Extremities. Lower extremities were covered with bandage,  bandage was soiled with serosanguineous discharge. Lungs. Clear bilaterally CV. Regular rate and rhythm Abdomen. Soft, nontender, bowel sounds positive.  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 Her mild agitation and depression today most probably due to cocaine withdrawal. Because of the chronicity of her wounds, her osteomyelitis can be treated as chronic osteomyelitis with oral doxycycline for 6-8 weeks. -Discontinue Zosyn and vancomycin. -Start doxycycline 100 mg twice a day. -Pain control with tramadol scheduled for every 6 hourly. -Oxycodone 5 mg for breakthrough pain as needed. -Daily dressing change. -We are waiting for palliative care recommendations.  Hypertension. Her blood pressure is still elevated. Might be due to her anxiety. We will keep monitoring, and continue her current dose of lisinopril 10 mg daily. -She might need an adjustment in her lisinopril.  Dispo:  Anticipated discharge in approximately 1 day(s).   Lorella Nimrod, MD 07/28/2016, 11:21 AM Pager: [phone]

## 2016-07-28 NOTE — Progress Notes (Signed)
Daily Progress Note   Patient Name: [name]       Date: 07/28/2016 DOB: [birth year]  Age: 55 y.o. MRN#: [id number] Attending Physician: Sid Falcon, MD Primary Care Physician: Zada Finders, MD Admit Date: 07/25/2016  Reason for Consultation/Follow-up: Establishing goals of care  Subjective: Myself and Charlynn Court, NP met again today with [name]. Very difficult situation. She had fluctuating mood during our visit today. She would laugh but then be tearful but she did not solely cry while we were there as she did yesterday. She is accepting of any support she can get at home. We discussed SNF but she says that she would rather be at home and that last time she went to SNF it was horrible and she continued to use cocaine while in facility. She does endorse more desire to abstain today but has no solid plan on how to stay sober. I encourage her to seek assistance and even a sponsor.   Also concerned with memory as she told us yesterday that she has lost all her family and does not know where anyone is at anymore. Today she tells [place] that she lives with her brother (whom she said she yesterday she had lost contact of him). Also attempted to revisit poor prognosis and GOC if she (as she likely will) decline further. She designates her brother as her surrogate Media planner. However she continues to tell us about all that she has endured and suffered in her lifetime and how God will continue to pull her through. Unable to truly address code status but she is accepting of outpatient palliative care services.   Length of Stay: 3  Current Medications: Scheduled Meds:  . collagenase   Topical Daily  . doxycycline  100 mg Oral Q12H  . enoxaparin (LOVENOX) injection  20 mg Subcutaneous Q24H  .  feeding supplement  1 Container Oral TID BM  . lisinopril  10 mg Oral Daily  . traMADol  50 mg Oral Q6H    Continuous Infusions:    PRN Meds: oxyCODONE  Physical Exam  Constitutional: She is oriented to person, place, and time. She appears well-developed.  Thin, frail  Cardiovascular: Normal rate.   Pulmonary/Chest: Effort normal. No accessory muscle usage. No tachypnea. No respiratory distress.  Abdominal: Normal appearance.  Neurological: She is  alert and oriented to person, place, and time.  Psychiatric: Her affect is labile. Her speech is not rapid and/or pressured and not slurred.  Nursing note and vitals reviewed.           Vital Signs: BP (!) 155/75 (BP Location: Left Arm)   Pulse 83   Temp 98.5 F (36.9 C) (Oral)   Resp 18   Ht 5' 1.5" (1.562 m)   Wt 40.8 kg (90 lb)   SpO2 98%   BMI 16.73 kg/m  SpO2: SpO2: 98 % O2 Device: O2 Device: Not Delivered O2 Flow Rate:    Intake/output summary:  Intake/Output Summary (Last 24 hours) at 07/28/16 2039 Last data filed at 07/28/16 1700  Gross per 24 hour  Intake              640 ml  Output             1700 ml  Net            -1060 ml   LBM: Last BM Date: 07/23/16 Baseline Weight: Weight: 40.8 kg (90 lb) Most recent weight: Weight: 40.8 kg (90 lb)       Palliative Assessment/Data:      Patient Active Problem List   Diagnosis Date Noted  . Wound drainage   . Tendonitis of ankle   . Infectious myositis   . Myalgia and myositis 06/15/2016  . Wound dehiscence   . Finger necrosis ([place]) 05/18/2016  . AKI (acute kidney injury) ([place])   . Acute renal failure (ARF) ([place]) 04/27/2016  . Healthcare maintenance 04/15/2016  . Leg ulcer ([place])   . Leg wound, left   . 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 ([place]) 06/04/2015  . Pressure ulcer 04/22/2015  . Skin necrosis ([place]) 04/21/2015  . Protein-calorie malnutrition, severe ([place]) 01/15/2015  . Swelling of finger, right 01/14/2015  . Foot ulceration ([place]) 01/14/2015  .  Severe recurrent major depression without psychotic features ([place]) 10/16/2014  . Cocaine use disorder, severe, dependence ([place]) 10/10/2014  . Cannabis use disorder, severe, dependence ([place]) 10/10/2014  . Substance or medication-induced depressive disorder with onset during intoxication ([place])   . Substance induced mood disorder ([place]) 10/09/2014  . Major depressive disorder, single episode, severe without psychotic features ([place])   . 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 Vasculitis ([place]) 04/19/2014  . Hepatomegaly 04/11/2014  . Acute pericarditis 03/31/2014  . Major depression, recurrent ([place]) 01/13/2014  . Inflammatory arthritis 09/24/2013  . Cocaine-induced vascular disorder ([place]) 06/19/2013  . Cocaine dependence ([place]) 05/15/2013  . Tobacco abuse 01/03/2013  . hyperlipidemia 01/03/2013  . Essential hypertension 02/26/2010    Palliative Care Assessment & Plan   HPI: 55 yo female with PMH vasculitis and chronic BLE wounds, HTN, cocaine abuse admitted from home 07/25/2016 with worsening odor and pain from LE wounds. No surgical options with recurrent cocaine abuse at this time.   Assessment: Lying in bed - a little less tearful today.   Recommendations/Plan:  Unfortunately I fear she is treating existential pain (as well as physical pain) with cocaine. I would be reluctant to provide any opioids with current drug use but would do frequent UDS and if she remained cocaine free would prescribe very short term opioids for pain with continued frequent UDS to continue opioids. On the other side without opioids she is extremely likely to seek out cocaine as well. There is no good answer at this time as she is unwilling to seek further assistance to rehabilitate from her  drug use. Would also benefit from counseling - this may help her more than anything else we could offer.   Goals of Care and Additional Recommendations:  Limitations on Scope of Treatment: Full Scope Treatment  Code  Status:  Full code  Prognosis:   Unable to determine but very poor with osteomyelitis with no surgical options d/t continued substance abuse.   Discharge Planning:  Home with Palliative Services   Thank you for allowing the Palliative Medicine Team to assist in the care of this patient.   Time In: 1400 Time Out: 1430 Total Time 47mn Prolonged Time Billed  no       Greater than 50%  of this time was spent counseling and coordinating care related to the above assessment and plan.  AVinie Sill NP Palliative Medicine Team Pager # [phone](M-F 8a-5p) Team Phone # [phone](Nights/Weekends)

## 2016-07-28 NOTE — Consult Note (Signed)
Consultation Note Date: 07/28/2016   Patient Name: [name]  DOB: 1961-07-27  MRN: [id number]  Age / Sex: 55 y.o., female  PCP: Zada Finders, MD Referring Physician: Sid Falcon, MD  Reason for Consultation: Establishing goals of care and Psychosocial/spiritual support  HPI/Patient Profile: 55 yo female with PMH vasculitis and chronic BLE wounds, HTN, cocaine abuse admitted from home 07/25/2016 with worsening odor and pain from LE wounds. No surgical options with recurrent cocaine abuse at this time.   Clinical Assessment and Goals of Care: Myself and Charlynn Court, NP met today with [name]. We approached the subject of her wounds and even lack of therapy. She immediately became tearful and remained tearful throughout much of our conversation. Very difficult to address goals in this state. She talks about being alone and having no family and that she has lost contact with everyone close to her. She talks about how difficult life has been for her.  When we address her cocaine abuse and how this is preventing her from any improvement she tells [place] how she has been to rehab a few times and "it seems like I  just came out doing more." She seems fairly hopeless at this point but also unwilling to accept any resources to assist her to get to a better place with her health or substance abuse.   Most of this visit was spent listening and attempting to calm and comfort [name]. At the end of our visit she continued to cry and called out for pain medication - seems like she is treating her existential suffering with pain medication at this time and I feel certain this is what cocaine is doing for her as well. Unclear where we can go from here but she clearly does not want to die but also does not seem willing to try and change her lifestyle. Will try again tomorrow.   Primary Decision Maker NEXT OF KIN self and then  her brother    SUMMARY OF RECOMMENDATIONS   Difficult situation Really no progress on rationale goals for her today  Code Status/Advance Care Planning:  Full code   Symptom Management:   BLE pain s/t wounds: She is receiving OxyIR 5 mg every 4 hours prn mod pain. Also on dilaudid  Palliative Prophylaxis:   Bowel Regimen, Delirium Protocol and Frequent Pain Assessment  Additional Recommendations (Limitations, Scope, Preferences):  Full Scope Treatment  Psycho-social/Spiritual:   Desire for further Chaplaincy support:yes  Prognosis:   < 6 months is very likely with what seems to be terminal wounds at this time.   Discharge Planning: Home with Home Health      Primary Diagnoses: Present on Admission: . Cocaine-induced vascular disorder ([place]) . Essential hypertension . Tobacco abuse . Cocaine dependence ([place]) . Major depression, recurrent ([place]) . Protein-calorie malnutrition, severe ([place]) . 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 ([place]) . Foot ulceration ([place]) . AKI (acute kidney injury) ([place])   I have reviewed the medical record, interviewed the patient and family, and  examined the patient. The following aspects are pertinent.  Past Medical History:  Diagnosis Date  . CAP (community acquired pneumonia) 03/2014 X 2  . Cocaine abuse    ongoing with resultant vaculitis.  . Daily headache   . Depression   . Hypertension   . Inflammatory arthritis   . Migraines    "probably 5-6/yr" (04/21/2014)  . Normocytic anemia    BL Hgb 9.8-12. Last anemia panel 04/2010 - showing Fe 19, ferritin 101.  Pt on monthly B12 injections  . Rheumatoid arthritis(714.0)    patient reported  . 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 Vasculitis ([place])    2/2 Levimasole toxicity. Followed by Dr. Louanne Skye   Social History   Social History  . Marital status: Single    Spouse name: N/A  . Number of children: 0  . Years of education: 11th grade    Occupational History  . Disability     since 2011, due to her rheumatoid arthritis   Social History Main Topics  . Smoking status: Current Every Day Smoker    Packs/day: 0.30    Years: 34.00    Types: Cigarettes  . Smokeless tobacco: Never Used     Comment: "Trying very hard". / 2-3 CIGARETTES A DAY  . Alcohol use No  . Drug use:     Types: "Crack" cocaine, Cocaine     Comment: States she still uses crack "a little bit"., last used 09/23/15  . Sexual activity: No   Other Topics Concern  . Not on file   Social History Narrative   Unemployed:  cleaning in past   Living at [facility]; has Medicaid.   crack/cocaine use; pt denies IVDU   tobacco:  1/2 ppd, trying to quit   alcohol:  none       Family History  Problem Relation Age of Onset  . Breast cancer Mother     Breast cancer  . Alcohol abuse Mother   . Colon cancer Maternal Aunt 3  . Alcohol abuse Father    Scheduled Meds: . collagenase   Topical Daily  . doxycycline  100 mg Oral Q12H  . enoxaparin (LOVENOX) injection  20 mg Subcutaneous Q24H  . feeding supplement  1 Container Oral TID BM  . lisinopril  10 mg Oral Daily  . traMADol  50 mg Oral Q6H   Continuous Infusions:  PRN Meds:.oxyCODONE Allergies  Allergen Reactions  . Acetaminophen Swelling and Other (See Comments)    Reaction:  Eyelid swelling   Review of Systems  Constitutional: Positive for appetite change.  Cardiovascular:       Leg pain  Neurological: Positive for weakness.  Psychiatric/Behavioral: The patient is nervous/anxious.     Physical Exam  Constitutional: She is oriented to person, place, and time. She appears well-developed.  Thin, frail  HENT:  Head: Normocephalic and atraumatic.  Cardiovascular: Normal rate.   Pulmonary/Chest: Effort normal. No accessory muscle usage. No tachypnea. No respiratory distress.  Abdominal: Normal appearance.  Neurological: She is alert and oriented to person, place, and time.  Psychiatric:   Very tearful    Vital Signs: BP (!) 155/75 (BP Location: Left Arm)   Pulse 83   Temp 98.5 F (36.9 C) (Oral)   Resp 18   Ht 5' 1.5" (1.562 m)   Wt 40.8 kg (90 lb)   SpO2 98%   BMI 16.73 kg/m  Pain Assessment: 0-10   Pain Score: 2  SpO2: SpO2: 98 % O2 Device:SpO2: 98 % O2 Flow Rate: .   IO: Intake/output summary:  Intake/Output Summary (Last 24 hours) at 07/28/16 2039 Last data filed at 07/28/16 1700  Gross per 24 hour  Intake              640 ml  Output             1700 ml  Net            -1060 ml    LBM: Last BM Date: 07/23/16 Baseline Weight: Weight: 40.8 kg (90 lb) Most recent weight: Weight: 40.8 kg (90 lb)     Palliative Assessment/Data:     Time In: 1300 Time Out: 1400 Time Total: 60mn Greater than 50%  of this time was spent counseling and coordinating care related to the above assessment and plan.  Signed by: AVinie Sill NP Palliative Medicine Team Pager # [phone](M-F 8a-5p) Team Phone # [phone](Nights/Weekends)

## 2016-07-29 ENCOUNTER — Encounter (HOSPITAL_COMMUNITY): Payer: Self-pay | Admitting: General Practice

## 2016-07-29 MED ORDER — DOXYCYCLINE HYCLATE 100 MG PO TABS: 100.0000 mg | ORAL_TABLET | Freq: Two times a day (BID) | ORAL | 0 refills | Status: DC

## 2016-07-29 MED ORDER — OXYCODONE HCL 5 MG PO TABS: 5.0000 mg | ORAL_TABLET | Freq: Three times a day (TID) | ORAL | 0 refills | Status: DC | PRN

## 2016-07-29 MED ORDER — DOCUSATE SODIUM 100 MG PO CAPS: 100.0000 mg | ORAL_CAPSULE | Freq: Two times a day (BID) | ORAL | 0 refills | Status: DC

## 2016-07-29 MED ORDER — POLYETHYLENE GLYCOL 3350 17 G PO PACK: 17.0000 g | PACK | Freq: Every day | ORAL | 0 refills | Status: DC | PRN

## 2016-07-29 MED ORDER — LISINOPRIL 20 MG PO TABS: 20.0000 mg | ORAL_TABLET | Freq: Every day | ORAL | Status: DC

## 2016-07-29 MED ORDER — LISINOPRIL 10 MG PO TABS: 20.0000 mg | ORAL_TABLET | Freq: Every day | ORAL | 5 refills | Status: DC

## 2016-07-29 MED ORDER — POLYETHYLENE GLYCOL 3350 17 G PO PACK
17.0000 g | PACK | Freq: Every day | ORAL | Status: DC | PRN
  Filled 2016-07-29: qty 1

## 2016-07-29 MED ORDER — DOCUSATE SODIUM 100 MG PO CAPS
100.0000 mg | ORAL_CAPSULE | Freq: Two times a day (BID) | ORAL | Status: DC
  Administered 2016-07-29: 100 mg via ORAL
  Filled 2016-07-29: qty 1

## 2016-07-29 NOTE — Evaluation (Signed)
Physical Therapy Evaluation Patient Details Name: [name] MRN: [id number] DOB: 1961/10/02 Today's Date: 07/29/2016   History of Present Illness  55 yo female with PMH vasculitis and chronic BLE wounds, HTN, cocaine abuse admitted from home 07/25/2016 with worsening odor and pain from LE wounds. No surgical options with recurrent cocaine abuse at this time.   Clinical Impression  Patient presents with decreased independence with mobility due to deficits listed in PT problem list.  She will benefit from skilled PT in the acute setting to allow return home, but current recommendation for SNF level rehab prior to d/c home.  Per chart she is refusing SNF.  May benefit from w/c and [facility] aide if continues to insist on d/c home.  She did not need assist to move to EOB or stand with walker, but was unable to tolerate standing or gait this session due to severe R LE pain.    Follow Up Recommendations SNF    Equipment Recommendations  Wheelchair (measurements PT)    Recommendations for Other Services       Precautions / Restrictions Precautions Precautions: Fall Precaution Comments: R LE wounds worse than L      Mobility  Bed Mobility Overal bed mobility: Modified Independent                Transfers Overall transfer level: Needs assistance Equipment used: Rolling walker (2 wheeled) Transfers: Sit to/from Stand Sit to Stand: Supervision         General transfer comment: stands pulling up on walker, unable to stand long due to severe pain R lower leg  Ambulation/Gait             General Gait Details: unable due to pain  Stairs            Wheelchair Mobility    Modified Rankin (Stroke Patients Only)       Balance Overall balance assessment: Needs assistance   Sitting balance-Leahy Scale: Good     Standing balance support: Bilateral upper extremity supported Standing balance-Leahy Scale: Poor Standing balance comment: needs UE support with walker in  standing, stood about 10 seconds, limited by severe R LE pain                             Pertinent Vitals/Pain Pain Assessment: Faces Faces Pain Scale: Hurts worst Pain Location: R LE esp with dependency Pain Descriptors / Indicators: Crying;Aching;Grimacing;Guarding;Sharp Pain Intervention(s): Monitored during session;Limited activity within patient's tolerance;RN gave pain meds during session    [place] expects to be discharged to:: Private residence (boarding house) Living Arrangements: Alone Available Help at Discharge: Friend(s);Available 24 hours/day   Home Access: Stairs to enter   Entrance Stairs-Number of Steps: 1 Home Layout: Two level;Bed/bath upstairs   Additional Comments: flight of stairs to her room in the boarding house    Prior Function Level of Independence: Independent with assistive device(s)         Comments: has been sponge bathing, someone takes her to get groceries, she cooks and cleans for herself     Hand Dominance        Extremity/Trunk Assessment   Upper Extremity Assessment: Defer to OT evaluation           Lower Extremity Assessment: RLE deficits/detail;LLE deficits/detail RLE Deficits / Details: moves leg WFL, but with kerlix bandage on entire lower leg and pt reports tendon and bone hanging out; severely painful with attempts to  stand LLE Deficits / Details: moves leg well, has bandage on lateral malleolus     Communication   Communication: No difficulties  Cognition Arousal/Alertness: Awake/alert Behavior During Therapy: Anxious (tearful with pain) Overall Cognitive Status: Within Functional Limits for tasks assessed                      General Comments      Exercises     Assessment/Plan    PT Assessment Patient needs continued PT services  PT Problem List Decreased activity tolerance;Decreased balance;Pain;Decreased mobility          PT Treatment Interventions DME  instruction;Therapeutic activities;Therapeutic exercise;Gait training;Stair training;Balance training;Functional mobility training    PT Goals (Current goals can be found in the Care Plan section)  Acute Rehab PT Goals Patient Stated Goal: To go home PT Goal Formulation: With patient Time For Goal Achievement: 08/05/16 Potential to Achieve Goals: Poor    Frequency Min 3X/week   Barriers to discharge Decreased caregiver support;Inaccessible home environment      Co-evaluation PT/OT/SLP Co-Evaluation/Treatment: Yes Reason for Co-Treatment: Complexity of the patient's impairments (multi-system involvement) PT goals addressed during session: Mobility/safety with mobility;Balance         End of Session Equipment Utilized During Treatment: Gait belt Activity Tolerance: Patient limited by pain Patient left: with call bell/phone within reach;in bed           Time: 1220-1245 PT Time Calculation (min) (ACUTE ONLY): 25 min   Charges:   PT Evaluation $PT Eval Moderate Complexity: 1 Procedure     PT G CodesReginia Naas 2016/08/10, 1:58 PM  Magda Kiel, [place] 08/10/2016

## 2016-07-29 NOTE — Progress Notes (Signed)
Pt was discharged to home via ambulance. Discharge instructions was given to pt, verbalized understanding.

## 2016-07-29 NOTE — Care Management (Addendum)
1149 Received call from [facility] at [place] , patient has been accepted for Palliative Care. Magdalen Spatz RN BSN [phone]     Discussed palliative care with patient , patient is agreeable. Wants palliative care through [place] . Called referral in . Unsure if they can accept due to cocaine use by patient. Referral faxed awaiting call back.   Confirmed face sheet information with patient . Patient will need transportation home at time of discharge . Patient requesting bus pass , however with limited mobility and wounds , ambulance transportation may be better.    Magdalen Spatz RN BSN [phone]

## 2016-07-29 NOTE — Evaluation (Signed)
Occupational Therapy Evaluation Patient Details Name: [name] MRN: [id number] DOB: 1961/09/18 Today's Date: 07/29/2016    History of Present Illness 55 yo female with PMH vasculitis and chronic BLE wounds, HTN, cocaine abuse admitted from home 07/25/2016 with worsening odor and pain from LE wounds. No surgical options with recurrent cocaine abuse at this time.    Clinical Impression   Pt reports she was independent with ADL PTA. Currently pt only tolerating sit to stand from EOB with supervision and NWB on RLE. Pt required min assist for LB ADL due to dressings on feet. Pt limited this session by pain. Recommending SNF for follow up to maximize independence and safety with ADL and functional mobility prior to return home. Pt would benefit from continued skilled OT to address established goals.    Follow Up Recommendations  SNF;Supervision/Assistance - 24 hour    Equipment Recommendations  3 in 1 bedside comode    Recommendations for Other Services       Precautions / Restrictions Precautions Precautions: Fall Precaution Comments: R LE wounds worse than L Restrictions Weight Bearing Restrictions: No      Mobility Bed Mobility Overal bed mobility: Modified Independent                Transfers Overall transfer level: Needs assistance Equipment used: Rolling walker (2 wheeled) Transfers: Sit to/from Stand Sit to Stand: Supervision         General transfer comment: stands pulling up on walker, unable to stand long due to severe pain R lower leg    Balance Overall balance assessment: Needs assistance   Sitting balance-Leahy Scale: Good     Standing balance support: Bilateral upper extremity supported Standing balance-Leahy Scale: Poor Standing balance comment: needs UE support with walker in standing, stood about 10 seconds, limited by severe R LE pain                            ADL Overall ADL's : Needs assistance/impaired      Grooming: Set up;Bed level;Wash/dry face   Upper Body Bathing: Set up;Supervision/ safety;Sitting   Lower Body Bathing: Minimal assistance;Sit to/from stand   Upper Body Dressing : Set up;Supervision/safety;Sitting   Lower Body Dressing: Minimal assistance;Sit to/from stand Lower Body Dressing Details (indicate cue type and reason): Pt able to don socks while performing long sit in bed. Min assist to doff due to bandages on feet.             Functional mobility during ADLs: Supervision/safety;Rolling walker (for sit<>stand only) General ADL Comments: Limited by pain this session.     Vision     Perception     Praxis      Pertinent Vitals/Pain Pain Assessment: Faces Faces Pain Scale: Hurts worst Pain Location: RLE Pain Descriptors / Indicators: Crying;Aching;Grimacing;Guarding;Sharp Pain Intervention(s): Monitored during session;Limited activity within patient's tolerance;Premedicated before session     Hand Dominance Right   Extremity/Trunk Assessment Upper Extremity Assessment Upper Extremity Assessment: Overall WFL for tasks assessed   Lower Extremity Assessment Lower Extremity Assessment: Defer to PT evaluation RLE Deficits / Details: moves leg WFL, but with kerlix bandage on entire lower leg and pt reports tendon and bone hanging out; severely painful with attempts to stand RLE: Unable to fully assess due to pain LLE Deficits / Details: moves leg well, has bandage on lateral malleolus LLE: Unable to fully assess due to pain   Cervical / Trunk Assessment Cervical / Trunk  Assessment: Normal   Communication Communication Communication: No difficulties   Cognition Arousal/Alertness: Awake/alert Behavior During Therapy: Anxious (tearful with pain) Overall Cognitive Status: Within Functional Limits for tasks assessed                     General Comments       Exercises       Shoulder Instructions      Home Living Family/patient expects to be  discharged to:: Other (Comment) (boarding house) Living Arrangements: Alone Available Help at Discharge: Friend(s);Available 24 hours/day Type of Home: House (boarding house) Home Access: Stairs to enter [employer] of Steps: 1   Home Layout: Two level;Bed/bath upstairs Alternate Level Stairs-Number of Steps: flight Alternate Level Stairs-Rails: Right;Left Bathroom Shower/Tub: Tub/shower unit Shower/tub characteristics: Architectural technologist: Standard     Home Equipment: Cane - single point;Walker - 2 wheels   Additional Comments: flight of stairs to her room in the boarding house      Prior Functioning/Environment Level of Independence: Independent with assistive device(s)        Comments: has been sponge bathing, someone takes her to get groceries, she cooks and cleans for herself        OT Problem List: Decreased activity tolerance;Pain   OT Treatment/Interventions: Self-care/ADL training;Energy conservation;DME and/or AE instruction;Therapeutic activities;Patient/family education;Balance training    OT Goals(Current goals can be found in the care plan section) Acute Rehab OT Goals Patient Stated Goal: To go home OT Goal Formulation: With patient Time For Goal Achievement: 08/12/16 Potential to Achieve Goals: Good ADL Goals Pt Will Perform Grooming: with supervision;standing Pt Will Perform Upper Body Bathing: with supervision;sitting Pt Will Perform Lower Body Bathing: with supervision;sit to/from stand Pt Will Transfer to Toilet: with supervision;ambulating;bedside commode  OT Frequency: Min 2X/week   Barriers to D/C: Decreased caregiver support;Inaccessible home environment  Bedroom upstairs. Lives with roommates in boarding house.       Co-evaluation PT/OT/SLP Co-Evaluation/Treatment: Yes Reason for Co-Treatment: Complexity of the patient's impairments (multi-system involvement) PT goals addressed during session: Mobility/safety with  mobility;Balance OT goals addressed during session: ADL's and self-care      End of Session Equipment Utilized During Treatment: Gait belt;Rolling walker  Activity Tolerance: Patient limited by pain Patient left: in bed;with call bell/phone within reach   Time: 1221-1239 OT Time Calculation (min): 18 min Charges:  OT General Charges $OT Visit: 1 Procedure OT Evaluation $OT Eval Moderate Complexity: 1 Procedure G-Codes:     Binnie Kand M.S., OTR/L Pager: [phone]  07/29/2016, 4:14 PM

## 2016-07-29 NOTE — Discharge Summary (Signed)
Name: [name] MRN: [id number] DOB: [birth year] 55 y.o. PCP: Zada Finders, MD  Date of Admission: 07/25/2016 12:16 AM Date of Discharge: 07/30/2016 Attending Physician: No att. providers found  Discharge Diagnosis: 1. 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 2. Osteomyelitis of right great toe. 3. Hypertension  Discharge Medications:   Medication List    STOP taking these medications   sulfamethoxazole-trimethoprim 800-160 MG tablet Commonly known as:  BACTRIM DS,SEPTRA DS   traMADol 50 MG tablet Commonly known as:  ULTRAM     TAKE these medications   docusate sodium 100 MG capsule Commonly known as:  COLACE Take 1 capsule (100 mg total) by mouth 2 (two) times daily.   doxycycline 100 MG tablet Commonly known as:  VIBRA-TABS Take 1 tablet (100 mg total) by mouth every 12 (twelve) hours.   feeding supplement Liqd Take 1 Container by mouth 3 (three) times daily between meals.   lisinopril 10 MG tablet Commonly known as:  PRINIVIL,ZESTRIL Take 2 tablets (20 mg total) by mouth daily. What changed:  how much to take   metroNIDAZOLE 0.75 % gel Commonly known as:  METROGEL Apply 1 application topically every morning.   oxyCODONE 5 MG immediate release tablet Commonly known as:  Oxy IR/ROXICODONE Take 1 tablet (5 mg total) by mouth every 8 (eight) hours as needed for moderate pain. What changed:  when to take this  reasons to take this   polyethylene glycol packet Commonly known as:  MIRALAX / GLYCOLAX Take 17 g by mouth daily as needed for mild constipation or moderate constipation.       Disposition and follow-up:   [name] was discharged from [facility] in Stable condition.  At the hospital follow up visit please address:  1.  Her wound care needs and blood pressure.Her pain and need of more narcotics.  2.  Labs / imaging needed at time of follow-up: None  3.  Pending labs/ test needing  follow-up: None  Follow-up Appointments: Follow-up Information    Zada Finders, MD Follow up on 08/03/2016.   Specialty:  Internal Medicine Why:  10:15 AM Contact information: [place] [id number] [place]              Follow up on 08/03/2016.   Why:  3:30 PM Contact information: 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 Her recent MRI done during this hospital stay shows osteomyelitis of the right great toe. There was no osteomyelitis noted on tibia and fibula of both lower extremities. She needs amputation, but Orthopedic will not operate on her because of her concurrent use of cocaine. She was treated initially with Zosyn and vancomycin. Her pain was controlled with Dilaudid and oxycodone. Because of the chronicity of her wounds, her osteomyelitis can be treated as chronic osteomyelitis with oral doxycycline for 6-8 weeks. As we ran out of options for her definitive treatment, we consulted palliative care. They will provide her the support with outpatient palliative and hospice management.  She was discharged prior  to doxycycline and oxycodone 5 mg TID X 5 days. She was given a closed follow-up for clinic and wound care center. As she will need close monitoring of her opiate use.  Hypertension. Her blood pressure remains elevated during her stay in the hospital. We increased her home dose of lisinopril from 10 to 20 mg daily.  Constipation. She was complaining of constipation during her stay most probably due to her extensive pain  medicine use.   Discharge Vitals:   BP (!) 150/72 (BP Location: Left Arm)   Pulse 82   Temp 98.9 F (37.2 C) (Oral)   Resp 18   Ht 5' 1.5" (1.562 m)   Wt 90 lb (40.8 kg)   SpO2 100%   BMI 16.73 kg/m   Gen. lean lady, lying comfortably in bed, less tearful as compared to before. Extremities. Lower extremities were covered with clean bandage, no discharge noted. Lungs. Clear bilaterally CV. Regular rate and rhythm Abdomen.Soft, nontender, bowel sounds positive.  Pertinent Labs, Studies, and Procedures:  CBC Latest Ref Rng & Units 07/25/2016 06/18/2016 06/17/2016  WBC 4.0 - 10.5 K/uL 9.7 5.7 4.7  Hemoglobin 12.0 - 15.0 g/dL 13.2 9.6(L) 9.5(L)  Hematocrit 36.0 - 46.0 % 39.5 29.3(L) 28.5(L)  Platelets 150 - 400 K/uL 317 306 263   CMP Latest Ref Rng & Units 07/26/2016 07/25/2016 06/18/2016  Glucose 65 - 99 mg/dL 106(H) 83 97  BUN 6 - 20 mg/dL 11 12 13   Creatinine 0.44 - 1.00 mg/dL 0.95 1.17(H) 0.73  Sodium 135 - 145 mmol/L 139 138 138  Potassium 3.5 - 5.1 mmol/L 3.9 4.0 5.0  Chloride 101 - 111 mmol/L 111 105 108  CO2 22 - 32 mmol/L 22 20(L) 25  Calcium 8.9 - 10.3 mg/dL 8.0(L) 9.3 8.9  Total Protein 6.5 - 8.1 g/dL - 7.4 -  Total Bilirubin 0.3 - 1.2 mg/dL - 1.0 -  Alkaline Phos 38 - 126 U/L - 69 -  AST 15 - 41 U/L - 13(L) -  ALT 14 - 54 U/L - 9(L) -   Urinalysis    Component Value Date/Time   COLORURINE YELLOW 07/25/2016 [place] 07/25/2016 0855   LABSPEC 1.026 07/25/2016 0855   PHURINE 6.0 07/25/2016 0855   GLUCOSEU 250 (A) 07/25/2016 0855   HGBUR NEGATIVE 07/25/2016 0855   BILIRUBINUR NEGATIVE 07/25/2016 0855   BILIRUBINUR small 04/29/2015 1533   KETONESUR 15 (A) 07/25/2016 0855   PROTEINUR NEGATIVE 07/25/2016 0855   UROBILINOGEN 1.0 06/14/2015 2234   NITRITE NEGATIVE 07/25/2016 0855   LEUKOCYTESUR NEGATIVE 07/25/2016 0855   Drugs of Abuse     Component Value Date/Time   LABOPIA POSITIVE (A) 07/25/2016 0855   COCAINSCRNUR POSITIVE (A) 07/25/2016  0855   COCAINSCRNUR NEG 08/31/2012 1045   LABBENZ NONE DETECTED 07/25/2016 0855   LABBENZ NEGATIVE 06/12/2011 0221   AMPHETMU NONE DETECTED 07/25/2016 0855   THCU POSITIVE (A) 07/25/2016 0855   LABBARB NONE DETECTED 07/25/2016 0855    Blood Culture. No growth ESR. 64  DG Tibia/Fibula Right. FINDINGS: There is no evidence of fracture or dislocation. No definite osseous erosions are seen.  A very large soft tissue wound is noted at the right posterior lower leg. No radiopaque foreign bodies are seen.  The ankle joint is grossly unremarkable in appearance. The knee joint is unremarkable in appearance. No knee joint effusion is seen.  IMPRESSION: No evidence of fracture or dislocation. No definite osseous erosions  seen. Very large soft tissue wound at the  right posterior lower leg.  MR Right Foot. FINDINGS: Hallux valgus with lateral subluxation of the proximal phalanx of the great toe with respect to the first metatarsal. Severe thinning of the skin overlying the first MTP joint, possibly with a draining ulcer extending to the joint. Appearance concerning for draining septic arthritis with osteomyelitis of the distal first metatarsal and proximal phalanx first digit. This also corresponds to the known site of ulceration.  Lisfranc ligament intact. The remaining metatarsals and phalanges appear intact as does the visualize part of the midfoot.  Despite efforts by the technologist and patient, motion artifact is present on today's exam and could not be eliminated. This reduces exam sensitivity and specificity.  Subcutaneous edema noted along the lateral ball of the foot. There is also edema plantar to the second and third MTP joints in the subcutaneous tissues.  IMPRESSION: 1. Ulceration medially overlying the first MTP joint, with possible drainage of the joint along the ulceration given the severity of thinning of the overlying scan. There is hallux valgus with lateral  displacement of the proximal phalanx on the first metatarsal head, an abnormal edema and enhancement in the distal first metatarsal and proximal phalanx great toe suspicious for osteomyelitis.  MR Tibia and Fibula Both Extremities. FINDINGS: Right calf:  Markedly severe ulceration and soft tissue defect in the distal third of the right calf extending down to the superficial fascia all muscle layers of the soleus medially, and with irregular ulceration posterolaterally as well. There is abnormal edema signal within the soleus muscle am within the flexor hallucis longus muscle indicative of myositis and fasciitis. I do not see a drainable abscess tracking deep to the ulceration. There is considerable cellulitis along the lower margin of the ulceration, extending superficial to the medial malleolus. The region of ulceration extends down to the calcaneus and tracks around the Achilles tendon, total length 15.7 cm. There is tendinopathy along the distal gastrocnemius tendons and soleus tendon.  I do not observe underlying osteomyelitis of the right tibia/fibula.  Left calf:  The findings on the left are less severe than on the right, with a  approximately 3 cm ulceration posteromedially in the subcutaneous tissues, along the posteromedial margin of the medial flexor tendons, questionably exposing the medial margin of the distal soleus muscle. This does not appear to erode into the tarsal tunnel and there is relatively mild surrounding cellulitis. No significant associated myositis. No drainable abscess. No findings of left tibial or fibular osteomyelitis.  IMPRESSION: 1. Markedly severe cutaneous and subcutaneous ulceration of the right distal calf over a 15.7 cm excursion, extending around the soleus muscle and Achilles tendon, with lack of overlying subcutaneous and cutaneous tissues along portions of the muscle beds. There is abnormal myositis involving the soleus and flexor hallucis longus  muscles with some underlying fasciitis, but no drainable abscess or osteomyelitis. Surrounding cellulitis noted. 2. 3 cm ulceration posteromedially in the distal subcutaneous tissues of the left calf. Very minimal surrounding cellulitis without drainable abscess or osteomyelitis of the left tibia/fibula.   Discharge Instructions: Discharge Instructions    Diet - low sodium heart healthy    Complete by:  As directed    Discharge instructions    Complete by:  As directed    Please take your medicines as directed. You have an appointment to be seen in clinic on Monday at 10:15 in the morning, later the same day you have an appointment at wound care center at 3:30 PM. I am providing you with  the pain medicine to last just 5 days, you can ask more during your follow-up visit in the clinic.   Increase activity slowly    Complete by:  As directed       Signed: Lorella Nimrod, MD 07/30/2016, 7:01 PM   Pager: [phone]

## 2016-07-29 NOTE — Progress Notes (Signed)
   Subjective: Patient was complaining of pain in her right leg. She still wants to go home, still denying any skilled nursing facility. She states that she's been able to ambulate with walker. She lives in an boarding house with her brother and 4- 5 more people, according to the patient they all help her. She is willing to continue wound care clinic, and said that she can also change her on dressing if she had the material. She also complained of being constipated for a few days now. Objective:  Vital signs in last 24 hours: Vitals:   07/28/16 1458 07/28/16 2053 07/28/16 2233 07/29/16 0620  BP: (!) 155/75 (!) 180/74 (!) 181/78 (!) 151/91  Pulse: 83 81 76 78  Resp: 18 18 18 19   Temp: 98.5 F (36.9 C) 99 F (37.2 C)  98.3 F (36.8 C)  TempSrc: Oral Oral  Oral  SpO2: 98% 100%  100%  Weight:      Height:       Gen. lean lady, lying comfortably in bed, less tearful as compared to before. Extremities. Lower extremities were covered with clean bandage, no discharge noted. Lungs. Clear bilaterally CV. Regular rate and rhythm Abdomen.Soft, nontender, bowel sounds positive.  Assessment/Plan:  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 Patient was little less agitated today. She is still complaining of pain mostly in her right leg. She is using oxycodone almost every 4 hourly, along with her scheduled tramadol. I'm concerned sending her home with that many pain medicines. Although she don't have any history of opioid abuse but she do uses cocaine pretty regularly.  Palliative care will continue to provide her  assistance as an outpatient. -Continue doxycycline 100 mg twice daily. -Continue tramadol every 6 hourly. -She can be discharged  home after PT and OT evaluation and with the recommendations.  Hypertension. Her blood pressure is still elevated. She was much less anxious today. -Increase her lisinopril to 20 mg daily.  Constipation. Most probably due to pain medications. -Colace twice daily. -MiraLAX  Dispo: Anticipated discharge in approximately 1 day(s).   Cristina Nimrod, MD 07/29/2016, 10:24 AM Pager: [phone]

## 2016-07-29 NOTE — Care Management (Addendum)
    1530 Medical necessity papers for transport and [facility] number placed on shadow chart in case patient discharged this evening. Bedside RN aware and can call [facility] if needed.    PT recommending SNF and wheelchair . Discussed same with patient . Patient is insisting she is going home at discharge and she does not want a wheelchair .   Patient requesting bus pass to get home . Explained NCM does not feel that it is safe for her to go home by bus or cab , since she was unable to put weight on right foot during PT. Patient agreeable to ambulance transportation home. Patient confirmed face sheet information . Patient states she lives with 4 - 5 people and she has her own key , so no need to call anyone.    Magdalen Spatz RN BSN [phone]

## 2016-07-30 LAB — CULTURE, BLOOD (ROUTINE X 2)
Culture: NO GROWTH
Culture: NO GROWTH

## 2016-08-01 ENCOUNTER — Emergency Department (HOSPITAL_COMMUNITY): Payer: Medicaid Other

## 2016-08-01 ENCOUNTER — Emergency Department (HOSPITAL_COMMUNITY)
Admission: EM | Admit: 2016-08-01 | Discharge: 2016-08-01 | Disposition: A | Discharge status: Home / Self Care | Payer: Medicaid Other | Attending: Emergency Medicine | Admitting: Emergency Medicine

## 2016-08-01 DIAGNOSIS — L958 Other vasculitis limited to the skin: Secondary | ICD-10-CM | POA: Diagnosis not present

## 2016-08-01 DIAGNOSIS — Z803 Family history of malignant neoplasm of breast: Secondary | ICD-10-CM | POA: Diagnosis not present

## 2016-08-01 DIAGNOSIS — M069 Rheumatoid arthritis, unspecified: Secondary | ICD-10-CM | POA: Diagnosis not present

## 2016-08-01 DIAGNOSIS — Z886 Allergy status to analgesic agent status: Secondary | ICD-10-CM

## 2016-08-01 DIAGNOSIS — Z800 Family history of malignant neoplasm of digestive organs: Secondary | ICD-10-CM | POA: Diagnosis not present

## 2016-08-01 DIAGNOSIS — Z811 Family history of alcohol abuse and dependence: Secondary | ICD-10-CM

## 2016-08-01 DIAGNOSIS — M8660 Other chronic osteomyelitis, unspecified site: Secondary | ICD-10-CM

## 2016-08-01 DIAGNOSIS — Z89022 Acquired absence of left finger(s): Secondary | ICD-10-CM

## 2016-08-01 DIAGNOSIS — F14288 Cocaine dependence with other cocaine-induced disorder: Secondary | ICD-10-CM

## 2016-08-01 DIAGNOSIS — L97918 Non-pressure chronic ulcer of unspecified part of right lower leg with other specified severity: Secondary | ICD-10-CM | POA: Diagnosis not present

## 2016-08-01 DIAGNOSIS — F17210 Nicotine dependence, cigarettes, uncomplicated: Secondary | ICD-10-CM | POA: Diagnosis not present

## 2016-08-01 DIAGNOSIS — L97928 Non-pressure chronic ulcer of unspecified part of left lower leg with other specified severity: Secondary | ICD-10-CM | POA: Diagnosis not present

## 2016-08-01 DIAGNOSIS — I10 Essential (primary) hypertension: Secondary | ICD-10-CM | POA: Diagnosis not present

## 2016-08-01 DIAGNOSIS — L089 Local infection of the skin and subcutaneous tissue, unspecified: Secondary | ICD-10-CM | POA: Diagnosis present

## 2016-08-01 DIAGNOSIS — M86661 Other chronic osteomyelitis, right tibia and fibula: Secondary | ICD-10-CM | POA: Diagnosis not present

## 2016-08-01 DIAGNOSIS — R079 Chest pain, unspecified: Secondary | ICD-10-CM | POA: Diagnosis not present

## 2016-08-01 DIAGNOSIS — F1410 Cocaine abuse, uncomplicated: Secondary | ICD-10-CM

## 2016-08-01 LAB — BASIC METABOLIC PANEL
Anion gap: 10 (ref 5–15)
BUN: 22 mg/dL — AB (ref 6–20)
CALCIUM: 9.8 mg/dL (ref 8.9–10.3)
CO2: 25 mmol/L (ref 22–32)
CREATININE: 1.13 mg/dL — AB (ref 0.44–1.00)
Chloride: 108 mmol/L (ref 101–111)
GFR calc Af Amer: >60 mL/min (ref >60)
GFR, EST NON AFRICAN AMERICAN: 54 mL/min — AB (ref >60)
GLUCOSE: 79 mg/dL (ref 65–99)
Potassium: 4 mmol/L (ref 3.5–5.1)
Sodium: 143 mmol/L (ref 135–145)

## 2016-08-01 LAB — CBC WITH DIFFERENTIAL/PLATELET
BASOS ABS: 0 10*3/uL (ref 0.0–0.1)
BASOS PCT: 0 %
EOS PCT: 1 %
Eosinophils Absolute: 0.1 10*3/uL (ref 0.0–0.7)
HCT: 31.3 % — ABNORMAL LOW (ref 36.0–46.0)
Hemoglobin: 10.3 g/dL — ABNORMAL LOW (ref 12.0–15.0)
Lymphocytes Relative: 15 %
Lymphs Abs: 1.6 10*3/uL (ref 0.7–4.0)
MCH: 28.9 pg (ref 26.0–34.0)
MCHC: 32.9 g/dL (ref 30.0–36.0)
MCV: 87.9 fL (ref 78.0–100.0)
MONO ABS: 0.7 10*3/uL (ref 0.1–1.0)
Monocytes Relative: 7 %
Neutro Abs: 7.9 10*3/uL — ABNORMAL HIGH (ref 1.7–7.7)
Neutrophils Relative %: 77 %
PLATELETS: 390 10*3/uL (ref 150–400)
RBC: 3.56 MIL/uL — ABNORMAL LOW (ref 3.87–5.11)
RDW: 14.4 % (ref 11.5–15.5)
WBC: 10.2 10*3/uL (ref 4.0–10.5)

## 2016-08-01 LAB — I-STAT TROPONIN, ED
TROPONIN I, POC: 0 ng/mL (ref 0.00–0.08)
Troponin i, poc: 0 ng/mL (ref 0.00–0.08)

## 2016-08-01 LAB — SEDIMENTATION RATE: SED RATE: 111 mm/h — AB (ref 0–22)

## 2016-08-01 MED ORDER — ASPIRIN 81 MG PO CHEW
324.0000 mg | CHEWABLE_TABLET | Freq: Once | ORAL | Status: AC
  Administered 2016-08-01: 324 mg via ORAL
  Filled 2016-08-01: qty 4

## 2016-08-01 MED ORDER — DOXYCYCLINE HYCLATE 100 MG PO TABS
100.0000 mg | ORAL_TABLET | Freq: Once | ORAL | Status: AC
  Administered 2016-08-01: 100 mg via ORAL
  Filled 2016-08-01: qty 1

## 2016-08-01 MED ORDER — HYDROMORPHONE HCL 2 MG/ML IJ SOLN
1.0000 mg | Freq: Once | INTRAMUSCULAR | Status: AC
  Administered 2016-08-01: 1 mg via INTRAVENOUS
  Filled 2016-08-01: qty 1

## 2016-08-01 MED ORDER — OXYCODONE HCL 5 MG PO TABS
10.0000 mg | ORAL_TABLET | Freq: Once | ORAL | Status: AC
  Administered 2016-08-01: 10 mg via ORAL
  Filled 2016-08-01: qty 2

## 2016-08-01 NOTE — ED Provider Notes (Signed)
[facility] DEPT Provider Note   CSN: [id number] Arrival date & time: 08/01/16  1144     History   Chief Complaint Chief Complaint  Patient presents with  . Wound Infection    HPI [name] is a 55 y.o. female.  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 Patient was discharged from this hospital on 10/25 after being admitted for a wound infection and right toe osteomyelitis. States she has been taking her Bactrim but has not been taking pain medicine because she cannot afford it. She was walking across the street and felt sudden worsening of pain from her mid shin down to her ankle. No acute injury. She feels like her wound is looking worse. She states she can see the tendons, then tells me this is not new. She states while she was waiting for EMS to come, she started having chest pain which has now been going on first few minutes. It feels like taking aching in her left lower chest. No associated radiation, short of breath, diaphoresis, or nausea/vomiting. Tells me that she lasted cocaine 2 days ago.   Past Medical History:  Diagnosis Date  . CAP (community acquired pneumonia) 03/2014 X 2  . Cocaine abuse    ongoing with resultant vaculitis.  . Depression   . Headache    "weekly" (07/29/2016)  . Hypertension   . Inflammatory arthritis   . Migraines    "probably 5-6/yr" (07/29/2016)  . Normocytic anemia    BL Hgb 9.8-12. Last anemia panel 04/2010 - showing Fe 19, ferritin 101.  Pt on monthly B12 injections  . Rheumatoid arthritis ([place])   . Rheumatoid arthritis(714.0)    patient reported  . 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 Followed by Dr. Louanne Skye    Patient Active Problem List   Diagnosis Date Noted  . Palliative care encounter   . Wound drainage   . Tendonitis of ankle   . Infectious  myositis   . Myalgia and myositis 06/15/2016  . Wound dehiscence   . Finger necrosis ([place]) 05/18/2016  . AKI (acute kidney injury) ([place])   . Acute renal failure (ARF) ([place]) 04/27/2016  . Healthcare maintenance 04/15/2016  . Leg ulcer ([place])   . Leg wound, left   . 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 ([place]) 06/04/2015  . Pressure ulcer 04/22/2015  . Skin necrosis ([place]) 04/21/2015  . Protein-calorie malnutrition, severe ([place]) 01/15/2015  . Swelling of finger, right 01/14/2015  . Foot ulceration ([place]) 01/14/2015  . Severe recurrent major depression without psychotic features ([place]) 10/16/2014  . Cocaine use disorder, severe, dependence ([place]) 10/10/2014  . Cannabis use disorder, severe, dependence ([place]) 10/10/2014  . Substance or medication-induced depressive disorder with onset during intoxication ([place])   . Substance induced mood disorder ([place]) 10/09/2014  . Major depressive disorder, single episode, severe without psychotic features ([place])   . 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 Vasculitis ([place]) 04/19/2014  . Hepatomegaly 04/11/2014  . Acute pericarditis 03/31/2014  . Major depression, recurrent ([place]) 01/13/2014  . Inflammatory arthritis 09/24/2013  . 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 Cocaine dependence ([place]) 05/15/2013  . Tobacco abuse 01/03/2013  . hyperlipidemia 01/03/2013  . Essential hypertension 02/26/2010    Past Surgical History:  Procedure Laterality Date  . AMPUTATION Left 05/22/2016   Procedure: AMPUTATION LEFT LONG  FINGER;  Surgeon: Marybelle Killings, MD;  Location: [facility];  Service: Orthopedics;  Laterality: Left;  . HERNIA REPAIR     "stomach"  . I&D EXTREMITY Right 09/26/2015   Procedure: IRRIGATION AND DEBRIDEMENT LEG WOUND  VAC PLACEMENT.;  Surgeon: Loel Lofty Dillingham, DO;  Location: [facility];  Service: Plastics;  Laterality: Right;  . IRRIGATION AND DEBRIDEMENT ABSCESS Bilateral 09/26/2013   Procedure: DEBRIDEMENT ULCERS BILATERAL THIGHS;   Surgeon: Gwenyth Ober, MD;  Location: [facility];  Service: General;  Laterality: Bilateral;  . SKIN BIOPSY Bilateral    shin nodules    OB History    No data available       Home Medications    Prior to Admission medications   Medication Sig Start Date End Date Taking? Authorizing Provider  doxycycline (VIBRA-TABS) 100 MG tablet Take 1 tablet (100 mg total) by mouth every 12 (twelve) hours. 07/29/16  Yes Lorella Nimrod, MD  lisinopril (PRINIVIL,ZESTRIL) 10 MG tablet Take 2 tablets (20 mg total) by mouth daily. 07/29/16  Yes Lorella Nimrod, MD  docusate sodium (COLACE) 100 MG capsule Take 1 capsule (100 mg total) by mouth 2 (two) times daily. Patient not taking: Reported on 08/01/2016 07/29/16   Lorella Nimrod, MD  feeding supplement (BOOST / RESOURCE BREEZE) LIQD Take 1 Container by mouth 3 (three) times daily between meals. Patient not taking: Reported on 08/01/2016 05/01/16   [name], MD  metroNIDAZOLE (METROGEL) 0.75 % gel Apply 1 application topically every morning. Patient not taking: Reported on 08/01/2016 05/23/16   Riccardo Dubin, MD  oxyCODONE (OXY IR/ROXICODONE) 5 MG immediate release tablet Take 1 tablet (5 mg total) by mouth every 8 (eight) hours as needed for moderate pain. Patient not taking: Reported on 08/01/2016 07/29/16   Lorella Nimrod, MD  polyethylene glycol (MIRALAX / GLYCOLAX) packet Take 17 g by mouth daily as needed for mild constipation or moderate constipation. Patient not taking: Reported on 08/01/2016 07/29/16   Lorella Nimrod, MD    Family History Family History  Problem Relation Age of Onset  . Breast cancer Mother     Breast cancer  . Alcohol abuse Mother   . Colon cancer Maternal Aunt 108  . Alcohol abuse Father     Social History Social History  Substance Use Topics  . Smoking status: Current Every Day Smoker    Packs/day: 0.12    Years: 38.00    Types: Cigarettes  . Smokeless tobacco: Never Used  . Alcohol use No     Allergies     Acetaminophen   Review of Systems Review of Systems  Constitutional: Negative for diaphoresis and fever.  Respiratory: Negative for shortness of breath.   Cardiovascular: Positive for chest pain.  Gastrointestinal: Negative for nausea and vomiting.  Musculoskeletal: Positive for arthralgias.  Skin: Positive for wound.  All other systems reviewed and are negative.    Physical Exam Updated Vital Signs BP 191/95 (BP Location: Left Arm)   Pulse 94   Temp 99.4 F (37.4 C) (Rectal)   Resp 21   Ht 5' 1.5" (1.562 m)   Wt 96 lb (43.5 kg)   SpO2 100%   BMI 17.85 kg/m   Physical Exam  Constitutional: She is oriented to person, place, and time. She appears well-developed and well-nourished.  HENT:  Head: Normocephalic and atraumatic.  Right Ear: External ear normal.  Left Ear: External ear normal.  Nose: Nose normal.  Eyes: Right eye exhibits no discharge. Left eye exhibits no discharge.  Cardiovascular: Normal rate, regular rhythm and normal heart sounds.   Pulmonary/Chest: Effort normal and breath sounds normal.  Abdominal: Soft. There is no tenderness.  Musculoskeletal:  Small wound over great toe. Right posterior calf as shown in picture. Not actively drainage but has foul odor. Diffuse tenderness along tibia.  Neurological: She is alert and oriented to person, place, and time.  Skin: Skin is warm and dry.  Nursing note and vitals reviewed.      ED Treatments / Results  Labs (all labs ordered are listed, but only abnormal results are displayed) Labs Reviewed  BASIC METABOLIC PANEL - Abnormal; Notable for the following:       Result Value   BUN 22 (*)    Creatinine, Ser 1.13 (*)    GFR calc non Af Amer 54 (*)    All other components within normal limits  CBC WITH DIFFERENTIAL/PLATELET - Abnormal; Notable for the following:    RBC 3.56 (*)    Hemoglobin 10.3 (*)    HCT 31.3 (*)    Neutro Abs 7.9 (*)    All other components within normal limits  SEDIMENTATION  RATE - Abnormal; Notable for the following:    Sed Rate 111 (*)    All other components within normal limits  I-STAT TROPOININ, ED  I-STAT TROPOININ, ED    EKG  EKG Interpretation  Date/Time:  Saturday August 01 2016 13:17:06 EDT Ventricular Rate:  86 PR Interval:    QRS Duration: 80 QT Interval:  404 QTC Calculation: 484 R Axis:   82 Text Interpretation:  Atrial-paced complexes Right atrial enlargement Left ventricular hypertrophy no significant change since Sept 2017 Confirmed by Regenia Skeeter MD, Elexia Friedt [phone]) on 08/01/2016 1:28:43 PM       Radiology Dg Chest 2 View  Result Date: 08/01/2016 CLINICAL DATA:  Acute chest pain EXAM: CHEST  2 VIEW COMPARISON:  03/14/2016 chest radiograph. FINDINGS: Stable cardiomediastinal silhouette with normal heart size and aortic atherosclerosis. No pneumothorax. No pleural effusion. Lungs appear clear, with no acute consolidative airspace disease and no pulmonary edema. IMPRESSION: No active cardiopulmonary disease.  Aortic atherosclerosis. Electronically Signed   By: Ilona Sorrel M.D.   On: 08/01/2016 13:23   Dg Tibia/fibula Right  Result Date: 08/01/2016 CLINICAL DATA:  Chronic wound.  Acute lower extremity pain. EXAM: RIGHT TIBIA AND FIBULA - 2 VIEW COMPARISON:  06/14/2016 right tibia/fibula radiographs FINDINGS: Large soft tissue defect is noted posteriorly at the level of the distal right tibial shaft. No fracture, cortical erosions, periosteal reaction or suspicious focal osseous lesion on these views. No evidence of malalignment at the right knee or right ankle on these views. No radiopaque foreign body. IMPRESSION: Large soft tissue defect posteriorly at the level of the distal right tibial shaft. No radiographic findings of osteomyelitis. Electronically Signed   By: Ilona Sorrel M.D.   On: 08/01/2016 13:22    Procedures Procedures (including critical care time)  Medications Ordered in ED Medications  HYDROmorphone (DILAUDID) injection 1  mg (1 mg Intravenous Given 08/01/16 1239)  aspirin chewable tablet 324 mg (324 mg Oral Given 08/01/16 1321)  HYDROmorphone (DILAUDID) injection 1 mg (1 mg Intravenous Given 08/01/16 1432)  doxycycline (VIBRA-TABS) tablet 100 mg (100 mg Oral Given 08/01/16 1638)  oxyCODONE (Oxy IR/ROXICODONE) immediate release tablet 10 mg (10 mg Oral Given 08/01/16 1638)     Initial Impression / Assessment and Plan / ED Course  I have reviewed the triage vital signs and the nursing notes.  Pertinent labs &  imaging results that were available during my care of the patient were reviewed by me and considered in my medical decision making (see chart for details).  Clinical Course  Comment By Time  Will give pain meds, check xray of leg give acute worsening of pain, and check ECG/labs for her chest pain Sherwood Gambler, MD 10/28 1208  D/w Teaching service, they will come to admit Sherwood Gambler, MD 10/28 1415  2nd trop negative Sherwood Gambler, MD 10/28 1727    Given she did cocaine 2 days ago, 2nd troponin performed for otherwise atypical CP. IM has seen and feels her wound is same as when they discharged her a few days ago. Made arrangements to f/u in their office in 2 days. ESR is elevated but appears clinically stable for discharge.  Final Clinical Impressions(s) / ED Diagnoses   Final diagnoses:  Chronic osteomyelitis ([place])  Chest pain, unspecified type  Cocaine abuse    New Prescriptions Discharge Medication List as of 08/01/2016  5:27 PM       Sherwood Gambler, MD 08/01/16 1942

## 2016-08-01 NOTE — ED Notes (Signed)
Wounds redressed.

## 2016-08-01 NOTE — ED Triage Notes (Addendum)
Patient to ED via EMS with C/O open wound on her right and left legs.  Patient stays at a boarding house.  Admits to  Using cocaine 2 days ago. Right lower, posterior wound is large and necrotic. Achilles tendon is exposed.  Left medial Malleolar wound is quarter sized with a maroon wound base.  Patient was at [facility] and called the ambulance because she could not walk.  States that she began having chest pain while waiting for the ambulance.

## 2016-08-01 NOTE — Consult Note (Signed)
Date: 08/01/2016               Patient Name:  [name] MRN: [id number]  DOB: 1961-06-12 Age / Sex: 54 y.o., female   PCP: Zada Finders, MD         Requesting Physician: Dr. 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 Patient has been admitted multiple times for her chronic osteomyelitis, most recently discharged two days ago on 07/30/2016. She has been evaluated by ortho on multiple occasions who have deemed she is not a surgical candidate due to her active cocaine use. Patient says after being discharged from the hospital on Thursday she went to a friend's birthday party the next day where she used cocaine. She was discharged with a prescription for doxycycline which she picked up from the pharmacy yesterday. She took one dose last night and one dose this morning. She was also given a prescription for oxycodone but she says she did not fill the prescription because she did not have money. She called 911 today because of pain and because she is afraid she will use cocaine again. She denies fevers or chills.   Meds: No current facility-administered medications for this encounter.    Current Outpatient Prescriptions  Medication Sig Dispense Refill  . doxycycline (VIBRA-TABS) 100 MG tablet Take 1 tablet (100 mg total) by mouth every 12 (twelve) hours. 90 tablet 0  . lisinopril (PRINIVIL,ZESTRIL) 10 MG tablet Take 2 tablets (20 mg total) by mouth daily. 30 tablet 5  . docusate sodium (COLACE) 100 MG capsule Take 1 capsule (100 mg total) by mouth 2 (two) times daily. (Patient not taking: Reported on 08/01/2016) 10 capsule 0  . feeding supplement (BOOST / RESOURCE BREEZE) LIQD Take 1 Container by mouth 3 (three) times daily  between meals. (Patient not taking: Reported on 08/01/2016) 8 Container 3  . metroNIDAZOLE (METROGEL) 0.75 % gel Apply 1 application topically every morning. (Patient not taking: Reported on 08/01/2016) 45 g 0  . oxyCODONE (OXY IR/ROXICODONE) 5 MG immediate release tablet Take 1 tablet (5 mg total) by mouth every 8 (eight) hours as needed for moderate pain. (Patient not taking: Reported on 08/01/2016) 15 tablet 0  . polyethylene glycol (MIRALAX / GLYCOLAX) packet Take 17 g by mouth daily as needed for mild constipation or moderate constipation. (Patient not taking: Reported on 08/01/2016) 14 each 0    Allergies: Allergies as of 08/01/2016 - Review Complete 08/01/2016  Allergen Reaction Noted  . Acetaminophen Swelling and Other (See Comments)    Past Medical History:  Diagnosis Date  . CAP (community acquired pneumonia) 03/2014 X 2  . Cocaine abuse    ongoing with resultant vaculitis.  . Depression   . Headache    "weekly" (07/29/2016)  . Hypertension   . Inflammatory arthritis   . Migraines    "probably 5-6/yr" (07/29/2016)  . Normocytic anemia    BL Hgb 9.8-12. Last anemia panel 04/2010 - showing Fe 19, ferritin 101.  Pt on monthly B12 injections  . Rheumatoid arthritis ([place])   . Rheumatoid arthritis(714.0)    patient reported  . VASCULITIS 04/17/2010   2/2 levimasole toxicity vs autoimmune d/o   ;  2/2 Levimasole  toxicity. Followed by Dr. Louanne Skye   Past Surgical History:  Procedure Laterality Date  . AMPUTATION Left 05/22/2016   Procedure: AMPUTATION LEFT LONG FINGER;  Surgeon: Marybelle Killings, MD;  Location: [facility];  Service: Orthopedics;  Laterality: Left;  . HERNIA REPAIR     "stomach"  . I&D EXTREMITY Right 09/26/2015   Procedure: IRRIGATION AND DEBRIDEMENT LEG WOUND  VAC PLACEMENT.;  Surgeon: Loel Lofty Dillingham, DO;  Location: [facility];  Service: Plastics;  Laterality: Right;  . IRRIGATION AND DEBRIDEMENT ABSCESS Bilateral 09/26/2013   Procedure: DEBRIDEMENT ULCERS BILATERAL  THIGHS;  Surgeon: Gwenyth Ober, MD;  Location: [facility];  Service: General;  Laterality: Bilateral;  . SKIN BIOPSY Bilateral    shin nodules   Family History  Problem Relation Age of Onset  . Breast cancer Mother     Breast cancer  . Alcohol abuse Mother   . Colon cancer Maternal Aunt 70  . Alcohol abuse Father    Social History   Social History  . Marital status: Single    Spouse name: N/A  . Number of children: 0  . Years of education: 11th grade   Occupational History  . Disability     since 2011, due to her rheumatoid arthritis   Social History Main Topics  . Smoking status: Current Every Day Smoker    Packs/day: 0.12    Years: 38.00    Types: Cigarettes  . Smokeless tobacco: Never Used  . Alcohol use No  . Drug use:     Types: "Crack" cocaine, Cocaine     Comment: 07/29/2016 "only use the one you smoke now., last used ~ 1-2 wk ago"  . Sexual activity: Not Currently   Other Topics Concern  . Not on file   Social History Narrative   Unemployed:  cleaning in past   Living at [place]; has Medicaid.   crack/cocaine use; pt denies IVDU   tobacco:  1/2 ppd, trying to quit   alcohol:  none        Review of Systems: Pertinent items are noted in HPI.  Physical Exam: Blood pressure 191/95, pulse 94, temperature 99.4 F (37.4 C), temperature source Rectal, resp. rate 21, height 5' 1.5" (1.562 m), weight 96 lb (43.5 kg), SpO2 100 %. Constitutional: tearful, NAD Cardiovascular: RRR, no murmurs, rubs, or gallops.  Pulmonary/Chest: CTAB, no wheezes, rales, or rhonchi. No chest wall abnormalities.  Abdominal: Soft, non tender, non distended. +BS.  Extremities: Right lower extremity with large open wound, exposed achilles tendon, serosanguinous drainage, malodorous smell. Open wounds also over her left malleolus and right first metatarsal (Please see ER note from today for picture).   Lab results:   Ref. Range 07/25/2016 00:41 08/01/2016 12:22  Sed Rate Latest  Ref Range: 0 - 22 mm/hr 64 (H) 111 (H)    Ref. Range 07/25/2016 00:41 08/01/2016 12:22  WBC Latest Ref Range: 4.0 - 10.5 K/uL 9.7 10.2  RBC Latest Ref Range: 3.87 - 5.11 MIL/uL 4.45 3.56 (L)  Hemoglobin Latest Ref Range: 12.0 - 15.0 g/dL 13.2 10.3 (L)  HCT Latest Ref Range: 36.0 - 46.0 % 39.5 31.3 (L)  MCV Latest Ref Range: 78.0 - 100.0 fL 88.8 87.9  MCH Latest Ref Range: 26.0 - 34.0 pg 29.7 28.9  MCHC Latest Ref Range: 30.0 - 36.0 g/dL 33.4 32.9  RDW Latest Ref Range: 11.5 - 15.5 % 14.9 14.4  Platelets Latest Ref Range: 150 - 400 K/uL 317 390   Imaging results:  Dg Chest 2 View  Result Date: 08/01/2016 CLINICAL DATA:  Acute chest pain EXAM: CHEST  2 VIEW COMPARISON:  03/14/2016 chest radiograph. FINDINGS: Stable cardiomediastinal silhouette with normal heart size and aortic atherosclerosis. No pneumothorax. No pleural effusion. Lungs appear clear, with no acute consolidative airspace disease and no pulmonary edema. IMPRESSION: No active cardiopulmonary disease.  Aortic atherosclerosis. Electronically Signed   By: Ilona Sorrel M.D.   On: 08/01/2016 13:23   Dg Tibia/fibula Right  Result Date: 08/01/2016 CLINICAL DATA:  Chronic wound.  Acute lower extremity pain. EXAM: RIGHT TIBIA AND FIBULA - 2 VIEW COMPARISON:  06/14/2016 right tibia/fibula radiographs FINDINGS: Large soft tissue defect is noted posteriorly at the level of the distal right tibial shaft. No fracture, cortical erosions, periosteal reaction or suspicious focal osseous lesion on these views. No evidence of malalignment at the right knee or right ankle on these views. No radiopaque foreign body. IMPRESSION: Large soft tissue defect posteriorly at the level of the distal right tibial shaft. No radiographic findings of osteomyelitis. Electronically Signed   By: Ilona Sorrel M.D.   On: 08/01/2016 13:22    Assessment, Plan, & Recommendations by Problem:  Multiple Chronic Open Wounds: In the setting of continued cocaine use.  Recently discharged with a course of PO doxycycline. Wounds appeared unchanged from discharge two days ago. Discussed with ED provider. Will give a dose of her home doxycycline and oxycodone and discharge from the ED with plans for close follow up on Monday. Instructed patient to continue taking her medication and to keep her scheduled appointments next week. Encouraged patient to abstain from cocaine.   The patient does have a current PCP (Zada Finders, MD) and does need an [facility] hospital follow-up appointment after discharge. Already scheduled for Monday 08/03/2016 at [facility]   Signed: Velna Ochs, MD 08/01/2016, 3:58 PM

## 2016-08-03 ENCOUNTER — Ambulatory Visit (INDEPENDENT_AMBULATORY_CARE_PROVIDER_SITE_OTHER): Payer: Medicaid Other | Admitting: Internal Medicine

## 2016-08-03 VITALS — BP 174/80 | HR 112 | Temp 98.2°F | Ht 61.5 in | Wt 87.4 lb

## 2016-08-03 DIAGNOSIS — L958 Other vasculitis limited to the skin: Secondary | ICD-10-CM

## 2016-08-03 DIAGNOSIS — F14288 Cocaine dependence with other cocaine-induced disorder: Secondary | ICD-10-CM

## 2016-08-03 DIAGNOSIS — F14988 Cocaine use, unspecified with other cocaine-induced disorder: Secondary | ICD-10-CM

## 2016-08-03 DIAGNOSIS — Z5189 Encounter for other specified aftercare: Secondary | ICD-10-CM

## 2016-08-03 DIAGNOSIS — I999 Unspecified disorder of circulatory system: Principal | ICD-10-CM

## 2016-08-03 MED ORDER — ONDANSETRON HCL 4 MG PO TABS: 4.0000 mg | ORAL_TABLET | Freq: Three times a day (TID) | ORAL | 0 refills | Status: DC | PRN

## 2016-08-03 NOTE — Assessment & Plan Note (Signed)
Ms. Hyder is in and out of the hospital/ED for her chronic wounds which, unfortunately, are a consequence of her recurrent use of cocaine. She is honest and forthcoming about her addiction which so far has been a hopeless battle. She was most recently admitted from 10/21-10/26 for her non-healing wounds. MRI of the right foot showed changes suspicious for osteomyelitis at the 1st digit. MRI did not show evidence of osteomyelitis of the tibia/fibula on either leg. As per usual care, orthopedics were consulted for her chronic wounds and likely need for amputation, however surgery has not been considered an option given her ongoing cocaine use which will limit any possible post-surgical healing.  She was discharged on Doxycycline 100 mg BID for treatment of chronic osteomyelitis and arranged with wound care follow up. She did present to the ED again on 10/28 for worsening right leg pain at her wound site after using cocaine again. IMTS saw patient in consultation and suggested continuing her current outpatient management and follow up in [facility] and wound care. Her wounds were wrapped at that time.  Patient states that her wound pain is controlled and she feels like there might be slight improvement. She has the wounds wrapped and does acknowledge her appointment with wound care this afternoon which she plans to keep. She does also acknowledge her cocaine addiction and relation to her wounds and does want to quit. She does not want to go to a nursing facility as she says there is no supervision and drug abuse is easily available. She would like to try inpatient drug rehab which our social worker begun work on. She tells me her last cocaine use of 3-4 days ago. She has been taking her Doxycycline once a day as she did not know this was a twice daily prescription. She has some nausea when taking Doxycycline, otherwise tolerates it well.  As patient is seeing wound care this afternoon, I did not remove her wound  dressings this visit. Photos from latest ED visit are available in the media tab. She denies any fever, chills, diaphoresis, or worsened pain. Patient strongly advised to follow up with wound care and try her best with cocaine cessation. She says she lives in a group home now which is a good environment for her. -Continue Doxycycline 100 mg BID (patient advised to take twice daily) -Zofran prn for nausea -follow up with Wound Care (patient given bus pass) -Continue cocaine cessation counseling (follow up on drug rehab options) -Continue to use walker

## 2016-08-03 NOTE — Progress Notes (Signed)
CC: Chronic leg wounds  HPI:  [name] is a 55 y.o. 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  Cocaine-induced levamisole vasculitis: [name] is in and out of the hospital/ED for her chronic wounds which, unfortunately, are a consequence of her recurrent use of cocaine. She is honest and forthcoming about her addiction which so far has been a hopeless battle. She was most recently admitted from 10/21-10/26 for her non-healing wounds. MRI of the right foot showed changes suspicious for osteomyelitis at the 1st digit. MRI did not show evidence of osteomyelitis of the tibia/fibula on either leg. As per usual care, orthopedics were consulted for her chronic wounds and likely need for amputation, however surgery has not been considered an option given her ongoing cocaine use which will limit any possible post-surgical healing.  She was discharged on Doxycycline 100 mg BID for treatment of chronic osteomyelitis and arranged with wound care follow up. She did present to the ED again on 10/28 for worsening right leg pain at her wound site after using cocaine again. IMTS saw patient in consultation and suggested continuing her current outpatient management and follow up in [facility] and wound care. Her wounds were wrapped at that time.  Patient states that her wound pain is controlled and she feels like there might be slight improvement. She has the wounds wrapped and does acknowledge her appointment with wound care this afternoon which she plans to keep. She does also acknowledge her cocaine addiction and relation to her wounds and does want to quit. She does not want to go to a nursing facility as she says there is no supervision and drug abuse is easily available. She would like to try inpatient drug rehab which our social worker begun work on. She tells me her last cocaine use of 3-4  days ago. She has been taking her Doxycycline once a day as she did not know this was a twice daily prescription. She has some nausea when taking Doxycycline, otherwise tolerates it well.  Past Medical History:  Diagnosis Date  . CAP (community acquired pneumonia) 03/2014 X 2  . Cocaine abuse    ongoing with resultant vaculitis.  . Depression   . Headache    "weekly" (07/29/2016)  . Hypertension   . Inflammatory arthritis   . Migraines    "probably 5-6/yr" (07/29/2016)  . Normocytic anemia    BL Hgb 9.8-12. Last anemia panel 04/2010 - showing Fe 19, ferritin 101.  Pt on monthly B12 injections  . Rheumatoid arthritis ([name])   . Rheumatoid arthritis(714.0)    patient reported  . 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 Followed by Dr. Louanne Skye    Review of Systems:   Review of Systems  Constitutional: Positive for chills. Negative for fever.  Respiratory: Negative for shortness of breath.   Cardiovascular: Negative for chest pain.  Musculoskeletal: Negative for falls.  Skin:       Lower extremity wounds maybe slightly better per patient, pain controlled  Neurological: Negative for loss of consciousness and headaches.  Psychiatric/Behavioral:       Reports last cocaine use 3-4 days ago     Physical Exam:  Vitals:   08/03/16 1114  BP: (!) 174/80  Pulse: (!) 112  Temp: 98.2 F (36.8 C)  TempSrc: Oral  SpO2: 100%  Weight: 87 lb 6.4  oz (39.6 kg)  Height: 5' 1.5" (1.562 m)   Physical Exam  Constitutional: No distress.  Chronically ill appearing woman, no acute distress, tearful  Cardiovascular: Regular rhythm.   Tachycardic, flow murmur heard RUS border  Pulmonary/Chest: Effort normal. No respiratory distress. She has no wheezes. She has no rales.  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  Patient discussed with Dr. Eppie Gibson

## 2016-08-03 NOTE — Patient Instructions (Addendum)
It was a pleasure to see you again Ms. Gillaspie.  Please continue the Doxycycline twice a day.  I have sent a prescription for Zofran to take as needed for nausea.  We will continue to work on trying to get you into a drug rehab program.  Please do your best to avoid using cocaine. Please call us if you want additional help staying off drugs.  Please see me again in 4 weeks or call us if you need to be seen sooner.  Please make sure to keep your appointments with wound care.

## 2016-08-06 ENCOUNTER — Encounter (HOSPITAL_BASED_OUTPATIENT_CLINIC_OR_DEPARTMENT_OTHER): Payer: Medicaid Other | Attending: Internal Medicine

## 2016-08-06 DIAGNOSIS — F1414 Cocaine abuse with cocaine-induced mood disorder: Secondary | ICD-10-CM | POA: Insufficient documentation

## 2016-08-06 DIAGNOSIS — M069 Rheumatoid arthritis, unspecified: Secondary | ICD-10-CM | POA: Diagnosis not present

## 2016-08-06 DIAGNOSIS — L97213 Non-pressure chronic ulcer of right calf with necrosis of muscle: Secondary | ICD-10-CM | POA: Diagnosis not present

## 2016-08-06 DIAGNOSIS — L97413 Non-pressure chronic ulcer of right heel and midfoot with necrosis of muscle: Secondary | ICD-10-CM | POA: Diagnosis not present

## 2016-08-06 DIAGNOSIS — D649 Anemia, unspecified: Secondary | ICD-10-CM | POA: Insufficient documentation

## 2016-08-06 DIAGNOSIS — I10 Essential (primary) hypertension: Secondary | ICD-10-CM | POA: Insufficient documentation

## 2016-08-06 DIAGNOSIS — L97323 Non-pressure chronic ulcer of left ankle with necrosis of muscle: Secondary | ICD-10-CM | POA: Insufficient documentation

## 2016-08-06 DIAGNOSIS — F419 Anxiety disorder, unspecified: Secondary | ICD-10-CM | POA: Insufficient documentation

## 2016-08-08 NOTE — Progress Notes (Signed)
[name], female   DOB: Jun 01, 1961, 55 y.o.   MRN: [id number]  Case discussed with Dr. Posey Pronto at the time of the visit.  We reviewed the resident's history and exam and pertinent patient test results.  I agree with the assessment, diagnosis, and plan of care documented in the resident's note.

## 2016-08-12 ENCOUNTER — Encounter (HOSPITAL_COMMUNITY): Payer: Self-pay | Admitting: *Deleted

## 2016-08-12 ENCOUNTER — Emergency Department (HOSPITAL_COMMUNITY): Payer: Medicaid Other

## 2016-08-12 ENCOUNTER — Emergency Department (HOSPITAL_COMMUNITY)
Admission: EM | Admit: 2016-08-12 | Discharge: 2016-08-12 | Disposition: A | Discharge status: Home / Self Care | Payer: Medicaid Other | Attending: Emergency Medicine | Admitting: Emergency Medicine

## 2016-08-12 DIAGNOSIS — F14988 Cocaine use, unspecified with other cocaine-induced disorder: Secondary | ICD-10-CM

## 2016-08-12 DIAGNOSIS — F17210 Nicotine dependence, cigarettes, uncomplicated: Secondary | ICD-10-CM | POA: Insufficient documentation

## 2016-08-12 DIAGNOSIS — F14188 Cocaine abuse with other cocaine-induced disorder: Secondary | ICD-10-CM | POA: Insufficient documentation

## 2016-08-12 DIAGNOSIS — Z79899 Other long term (current) drug therapy: Secondary | ICD-10-CM | POA: Insufficient documentation

## 2016-08-12 DIAGNOSIS — I10 Essential (primary) hypertension: Secondary | ICD-10-CM | POA: Insufficient documentation

## 2016-08-12 DIAGNOSIS — M79604 Pain in right leg: Secondary | ICD-10-CM | POA: Diagnosis present

## 2016-08-12 DIAGNOSIS — I999 Unspecified disorder of circulatory system: Secondary | ICD-10-CM

## 2016-08-12 LAB — CBC WITH DIFFERENTIAL/PLATELET
BASOS PCT: 0 %
Basophils Absolute: 0 10*3/uL (ref 0.0–0.1)
EOS PCT: 1 %
Eosinophils Absolute: 0.1 10*3/uL (ref 0.0–0.7)
HEMATOCRIT: 32.9 % — AB (ref 36.0–46.0)
Hemoglobin: 11.1 g/dL — ABNORMAL LOW (ref 12.0–15.0)
Lymphocytes Relative: 38 %
Lymphs Abs: 2.2 10*3/uL (ref 0.7–4.0)
MCH: 28.8 pg (ref 26.0–34.0)
MCHC: 33.7 g/dL (ref 30.0–36.0)
MCV: 85.2 fL (ref 78.0–100.0)
MONO ABS: 0.5 10*3/uL (ref 0.1–1.0)
MONOS PCT: 8 %
NEUTROS ABS: 3 10*3/uL (ref 1.7–7.7)
Neutrophils Relative %: 53 %
PLATELETS: 446 10*3/uL — AB (ref 150–400)
RBC: 3.86 MIL/uL — ABNORMAL LOW (ref 3.87–5.11)
RDW: 13.6 % (ref 11.5–15.5)
WBC: 5.7 10*3/uL (ref 4.0–10.5)

## 2016-08-12 LAB — BASIC METABOLIC PANEL
ANION GAP: 7 (ref 5–15)
BUN: 17 mg/dL (ref 6–20)
CALCIUM: 9.3 mg/dL (ref 8.9–10.3)
CO2: 25 mmol/L (ref 22–32)
CREATININE: 0.88 mg/dL (ref 0.44–1.00)
Chloride: 109 mmol/L (ref 101–111)
GFR calc Af Amer: >60 mL/min (ref >60)
GLUCOSE: 83 mg/dL (ref 65–99)
Potassium: 3.5 mmol/L (ref 3.5–5.1)
Sodium: 141 mmol/L (ref 135–145)

## 2016-08-12 MED ORDER — HYDROMORPHONE HCL 1 MG/ML IJ SOLN
1.0000 mg | Freq: Once | INTRAMUSCULAR | Status: DC
Start: 2016-08-12 — End: 2016-08-12
  Filled 2016-08-12: qty 1

## 2016-08-12 MED ORDER — ONDANSETRON HCL 4 MG/2ML IJ SOLN
4.0000 mg | Freq: Once | INTRAMUSCULAR | Status: AC
  Administered 2016-08-12: 4 mg via INTRAVENOUS
  Filled 2016-08-12: qty 2

## 2016-08-12 MED ORDER — OXYCODONE HCL 5 MG PO TABS: 5.0000 mg | ORAL_TABLET | ORAL | 0 refills | Status: DC | PRN

## 2016-08-12 MED ORDER — HYDROMORPHONE HCL 1 MG/ML IJ SOLN
1.0000 mg | Freq: Once | INTRAMUSCULAR | Status: AC
  Administered 2016-08-12: 1 mg via INTRAVENOUS

## 2016-08-12 MED ORDER — OXYCODONE HCL 5 MG PO TABS: 5.0000 mg | ORAL_TABLET | Freq: Once | ORAL | Status: DC

## 2016-08-12 NOTE — ED Notes (Signed)
Called Social worker to arrange transportation for patient because patient can not go back by [facility] due to patient can ambulate and sit in a wheelchair.

## 2016-08-12 NOTE — ED Provider Notes (Signed)
[facility] DEPT Provider Note   CSN: [id number] Arrival date & time: 08/12/16  1814     History   Chief Complaint No chief complaint on file.   HPI [name] is a 55 y.o. female who presents with worsening pain to a chronic slow healing wound of the right lower extremity. Past medical history significant for cocaine-induced vascular disorder and ongoing substance abuse. She states she has run out of her pain medicines and is in excruciating pain therefore came to the ED. She was last seen at the IM residency clinic on 10/30 who recommended continuing antibiotics and wound care since is not a surgical candidate due to ongoing cocaine use. She states she was supposed to see her wound care doctor yesterday however she said she could not make it. Last time she saw wound care was one week ago. She states she lives at a boarding house and is unable to walk. She states she normally can walk with a walker however now she is just sliding around on the floor. She has also been putting dirt on her wounds because she thinks that it promotes healing. She has not told her wound care doctor about this.She states she takes doxycycline twice daily and has been dressing her wounds daily.   HPI  Past Medical History:  Diagnosis Date  . CAP (community acquired pneumonia) 03/2014 X 2  . Cocaine abuse    ongoing with resultant vaculitis.  . Depression   . Headache    "weekly" (07/29/2016)  . Hypertension   . Inflammatory arthritis   . Migraines    "probably 5-6/yr" (07/29/2016)  . Normocytic anemia    BL Hgb 9.8-12. Last anemia panel 04/2010 - showing Fe 19, ferritin 101.  Pt on monthly B12 injections  . Rheumatoid arthritis(714.0)    patient reported  . 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 Followed by Dr. Louanne Skye    Patient Active Problem List   Diagnosis Date Noted  . Palliative care encounter   . Wound drainage   . Infectious  myositis   . Wound dehiscence   . Finger necrosis ([place]) 05/18/2016  . AKI (acute kidney injury) ([place])   . Leg ulcer ([place])   . 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 ([place]) 06/04/2015  . Pressure ulcer 04/22/2015  . Skin necrosis ([place]) 04/21/2015  . Protein-calorie malnutrition, severe ([place]) 01/15/2015  . Severe recurrent major depression without psychotic features ([place]) 10/16/2014  . Cocaine use disorder, severe, dependence ([place]) 10/10/2014  . Cannabis use disorder, severe, dependence ([place]) 10/10/2014  . Substance induced mood disorder ([place]) 10/09/2014  . 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 Hepatomegaly 04/11/2014  . Acute pericarditis 03/31/2014  . Inflammatory arthritis 09/24/2013  . 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 Tobacco abuse 01/03/2013  . hyperlipidemia 01/03/2013  . Essential hypertension 02/26/2010    Past Surgical History:  Procedure Laterality Date  . AMPUTATION Left 05/22/2016   Procedure: AMPUTATION LEFT LONG FINGER;  Surgeon: Marybelle Killings, MD;  Location: [facility];  Service: Orthopedics;  Laterality: Left;  . HERNIA REPAIR     "stomach"  . I&D EXTREMITY Right 09/26/2015   Procedure: IRRIGATION AND DEBRIDEMENT LEG WOUND  VAC PLACEMENT.;  Surgeon: Loel Lofty Dillingham, DO;  Location: [facility];  Service: Plastics;  Laterality: Right;  . IRRIGATION AND DEBRIDEMENT ABSCESS Bilateral 09/26/2013   Procedure: DEBRIDEMENT ULCERS BILATERAL THIGHS;  Surgeon: Gwenyth Ober, MD;  Location: [facility]  OR;  Service: General;  Laterality: Bilateral;  . SKIN BIOPSY Bilateral    shin nodules    OB History    No data available       Home Medications    Prior to Admission medications   Medication Sig Start Date End Date Taking? Authorizing Provider  doxycycline (VIBRA-TABS) 100 MG tablet Take 1 tablet (100 mg total) by mouth every 12 (twelve) hours. 07/29/16  Yes Lorella Nimrod, MD  lisinopril (PRINIVIL,ZESTRIL) 10 MG tablet Take 2 tablets (20 mg total) by mouth daily.  07/29/16  Yes Lorella Nimrod, MD  oxyCODONE (OXY IR/ROXICODONE) 5 MG immediate release tablet Take 1 tablet (5 mg total) by mouth every 8 (eight) hours as needed for moderate pain. 07/29/16  Yes Lorella Nimrod, MD  oxyCODONE (ROXICODONE) 5 MG immediate release tablet Take 1 tablet (5 mg total) by mouth every 4 (four) hours as needed for severe pain. 08/12/16   Recardo Evangelist, PA-C    Family History Family History  Problem Relation Age of Onset  . Breast cancer Mother     Breast cancer  . Alcohol abuse Mother   . Colon cancer Maternal Aunt 44  . Alcohol abuse Father     Social History Social History  Substance Use Topics  . Smoking status: Current Every Day Smoker    Packs/day: 0.12    Years: 38.00    Types: Cigarettes  . Smokeless tobacco: Never Used  . Alcohol use No     Allergies   Acetaminophen   Review of Systems Review of Systems  Constitutional: Negative for fever.  Musculoskeletal: Positive for arthralgias and gait problem.  Skin: Positive for wound.  All other systems reviewed and are negative.    Physical Exam Updated Vital Signs BP 176/93   Pulse 84   Temp 97.7 F (36.5 C) (Oral)   Resp 18   SpO2 100%   Physical Exam  Constitutional: She is oriented to person, place, and time. She appears well-developed and well-nourished. She appears distressed.  Tearful, cachetic appearing  HENT:  Head: Normocephalic and atraumatic.  Eyes: Conjunctivae are normal. Pupils are equal, round, and reactive to light. Right eye exhibits no discharge. Left eye exhibits no discharge. No scleral icterus.  Neck: Normal range of motion. Neck supple.  Cardiovascular: Normal rate and regular rhythm.   No murmur heard. Pulmonary/Chest: Effort normal and breath sounds normal. No respiratory distress.  Abdominal: Soft. She exhibits no distension. There is no tenderness.  Musculoskeletal: She exhibits no edema.  Right leg: Large, malodorous wound over the right lower leg with skin  hanging down. 2+ DP pulse (sec picture for details)  Left leg: 3cm ulceration which does not appear acutely infected  Neurological: She is alert and oriented to person, place, and time.  Skin: Skin is warm and dry.  Psychiatric: She has a normal mood and affect. Her behavior is normal.  Nursing note and vitals reviewed.          ED Treatments / Results  Labs (all labs ordered are listed, but only abnormal results are displayed) Labs Reviewed  CBC WITH DIFFERENTIAL/PLATELET - Abnormal; Notable for the following:       Result Value   RBC 3.86 (*)    Hemoglobin 11.1 (*)    HCT 32.9 (*)    Platelets 446 (*)    All other components within normal limits  BASIC METABOLIC PANEL    EKG  EKG Interpretation None       Radiology Dg  Tibia/fibula Right  Result Date: 08/12/2016 CLINICAL DATA:  Nonhealing wound in the posterior distal right lower extremity. EXAM: RIGHT TIBIA AND FIBULA - 2 VIEW COMPARISON:  Right tibia and fibula radiographs 08/01/2016 FINDINGS: A large soft tissue Ing is again seen posteriorly at the level of the distal tibia and fibula. There is no underlying osseous change. The ankle is located. Radiopaque dressing material is now in place. The proximal tibia and fibula are unremarkable. IMPRESSION: 1. Similar appearance of large soft tissue wound posterior to the distal tibia and fibula without evidence for osteomyelitis. Electronically Signed   By: San Morelle M.D.   On: 08/12/2016 19:22   Dg Ankle Complete Left  Result Date: 08/12/2016 CLINICAL DATA:  Chronic soft tissue wound along the medial malleolus. EXAM: LEFT ANKLE COMPLETE - 3+ VIEW COMPARISON:  Left ankle radiographs 06/14/2016. FINDINGS: A subtle soft tissue ulceration is noted along the medial malleolus. There is no underlying osseous abnormality. The ankle is located. The lateral malleolus is within normal limits. There is no significant joint effusion. IMPRESSION: Soft tissue swelling and subtle  ulceration along the medial malleolus without radiographic evidence for underlying osteomyelitis. Electronically Signed   By: San Morelle M.D.   On: 08/12/2016 19:24    Procedures Procedures (including critical care time)  Medications Ordered in ED Medications  ondansetron [facility]) injection 4 mg (4 mg Intravenous Given 08/12/16 2043)  HYDROmorphone (DILAUDID) injection 1 mg (1 mg Intravenous Given 08/12/16 2043)     Initial Impression / Assessment and Plan / ED Course  I have reviewed the triage vital signs and the nursing notes.  Pertinent labs & imaging results that were available during my care of the patient were reviewed by me and considered in my medical decision making (see chart for details).  Clinical Course    55 year old female with chronic non-healing wounds which appear slightly improved when compared to pictures taken in the ED on 10/28. She is afebrile and without leukocytosis. She is compliant with antibiotics but is here mostly because she is out of pain medicines. Xrays are negative for osteomyelitis. Pain control given and rx for pain meds. SW and CM consulted who has provided resources for substance abuse and a wheelchair with home health services since she is reportedly "sliding around" on the floor. Wound was cleaned and dressed in the ED. Encouraged close follow up with wound care and also advised to not put dirt in wound. Patient verbalized understanding. Return precautions given.   Final Clinical Impressions(s) / ED Diagnoses   Final diagnoses:  Cocaine-induced vascular disorder ([place])  Acute pain of right lower extremity    New Prescriptions Discharge Medication List as of 08/12/2016  9:28 PM    START taking these medications   Details  !! oxyCODONE (ROXICODONE) 5 MG immediate release tablet Take 1 tablet (5 mg total) by mouth every 4 (four) hours as needed for severe pain., Starting Wed 08/12/2016, Print     !! - Potential duplicate medications  found. Please discuss with provider.       Recardo Evangelist, PA-C 08/13/16 1113    Blanchie Dessert, MD 08/13/16 1458

## 2016-08-12 NOTE — Discharge Instructions (Signed)
Follow up with wound care doctor and your primary care doctor

## 2016-08-12 NOTE — ED Notes (Signed)
Patient transported to X-ray 

## 2016-08-12 NOTE — ED Triage Notes (Signed)
Pt  BIB  EMS from home with rt leg wound.  EMS noticed avulsion to rt leg. She has noticed for weeks. She was given oxycodone but does not help. Does not know how this happened, but just noticed her skin came off.

## 2016-08-12 NOTE — Progress Notes (Signed)
CSW provided patient with inpatient and outpatient substance abuse services.   Kingsley Spittle, [place] Clinical Social Worker [phone]

## 2016-08-12 NOTE — Care Management Note (Addendum)
Case Management Note  Patient Details  Name: [name] MRN: [id number] Date of Birth: [birth year]  Subjective/Objective: Patient brought to ED from home for right leg wound                   Action/Plan: Discharge home with home health services.  Patient lives in [place] home.  She still refuses to go to a SNF.  She has had [facility] in the past for home health services and she was very happy with their services and has chosen this agency again.  Patient also agreeable for wheelchair.  Patient agreeable to [facility] for dme.  Patient reports she is having difficulty getting to her wound care clinic at this time.  She reports she has missed her appointment yesterday but has scheduled an appointment for next Thursday per patient.  [facility] provided patient with phone number to [facility] and Medicaid Transportation.  Patient very thankful for services.  No further EDCM needs at this time.   Expected Discharge Date:                  Expected Discharge Plan:  [place]  In-House Referral:  Clinical Social Work  Discharge planning Services  CM Consult  Post Acute Care Choice:  Durable Medical Equipment, Home Health Choice offered to:  Patient  DME Arranged:  Programmer, multimedia DME Agency:  [place]:  RN, Nurse's Aide, Social Work [employer] Agency:  [facility]  Status of Service:  Completed, signed off  If discussed at [employer], dates discussed:    Additional Comments:  [facility] faxed home health orders to [facility] and dme order to [facility] with confirmation of receipt.  Christen Bame, Merelyn Klump, RN 08/12/2016, 9:32 PM

## 2016-08-12 NOTE — Progress Notes (Signed)
Patient suffers from chronic lower leg wounds which impairs their ability to perform daily activities like walking in the home. A walker will not resolve issue with performing activities of daily living. A wheelchair will allow patient to safely perform daily activities. Patient is not able to propel themselves in the home using a standard weight wheelchair due to weakness. Patient can self propel in the lightweight wheelchair.  Accessories: elevating leg rests (ELRs), wheel locks, extensions and anti-tippers.

## 2016-08-13 ENCOUNTER — Encounter (HOSPITAL_COMMUNITY): Payer: Self-pay | Admitting: Medical

## 2016-08-14 ENCOUNTER — Telehealth: Payer: Self-pay

## 2016-08-14 NOTE — Telephone Encounter (Signed)
Return Tina's call - no one answer; left message to give [place] a call back.

## 2016-08-14 NOTE — Telephone Encounter (Signed)
Tina from [facility] requesting VO.

## 2016-08-18 ENCOUNTER — Telehealth: Payer: Self-pay

## 2016-08-18 NOTE — Telephone Encounter (Signed)
[name] from [facility] requesting VO. Please call back.

## 2016-08-25 ENCOUNTER — Telehealth: Payer: Self-pay | Admitting: General Practice

## 2016-09-01 ENCOUNTER — Other Ambulatory Visit: Payer: Self-pay | Admitting: Internal Medicine

## 2016-09-01 NOTE — Telephone Encounter (Signed)
doxycycline (VIBRA-TABS) 100 MG tablet oxyCODONE (OXY IR/ROXICODONE) 5 MG immediate release tablet

## 2016-09-02 ENCOUNTER — Emergency Department (HOSPITAL_COMMUNITY)
Admission: EM | Admit: 2016-09-02 | Discharge: 2016-09-02 | Disposition: A | Discharge status: Home / Self Care | Payer: Medicaid Other | Attending: Emergency Medicine | Admitting: Emergency Medicine

## 2016-09-02 ENCOUNTER — Encounter (HOSPITAL_COMMUNITY): Payer: Self-pay

## 2016-09-02 ENCOUNTER — Emergency Department (HOSPITAL_COMMUNITY): Payer: Medicaid Other

## 2016-09-02 DIAGNOSIS — Z811 Family history of alcohol abuse and dependence: Secondary | ICD-10-CM

## 2016-09-02 DIAGNOSIS — L989 Disorder of the skin and subcutaneous tissue, unspecified: Secondary | ICD-10-CM | POA: Diagnosis not present

## 2016-09-02 DIAGNOSIS — F14288 Cocaine dependence with other cocaine-induced disorder: Secondary | ICD-10-CM | POA: Diagnosis not present

## 2016-09-02 DIAGNOSIS — Z800 Family history of malignant neoplasm of digestive organs: Secondary | ICD-10-CM

## 2016-09-02 DIAGNOSIS — F17210 Nicotine dependence, cigarettes, uncomplicated: Secondary | ICD-10-CM | POA: Diagnosis not present

## 2016-09-02 DIAGNOSIS — Z886 Allergy status to analgesic agent status: Secondary | ICD-10-CM

## 2016-09-02 DIAGNOSIS — L958 Other vasculitis limited to the skin: Secondary | ICD-10-CM

## 2016-09-02 DIAGNOSIS — Z5189 Encounter for other specified aftercare: Secondary | ICD-10-CM

## 2016-09-02 DIAGNOSIS — Z4800 Encounter for change or removal of nonsurgical wound dressing: Secondary | ICD-10-CM | POA: Diagnosis not present

## 2016-09-02 DIAGNOSIS — Z803 Family history of malignant neoplasm of breast: Secondary | ICD-10-CM

## 2016-09-02 DIAGNOSIS — I10 Essential (primary) hypertension: Secondary | ICD-10-CM | POA: Insufficient documentation

## 2016-09-02 DIAGNOSIS — F1410 Cocaine abuse, uncomplicated: Secondary | ICD-10-CM | POA: Insufficient documentation

## 2016-09-02 DIAGNOSIS — Z792 Long term (current) use of antibiotics: Secondary | ICD-10-CM

## 2016-09-02 MED ORDER — KETOROLAC TROMETHAMINE 60 MG/2ML IM SOLN
30.0000 mg | Freq: Once | INTRAMUSCULAR | Status: AC
  Administered 2016-09-02: 30 mg via INTRAMUSCULAR
  Filled 2016-09-02: qty 2

## 2016-09-02 MED ORDER — OXYCODONE HCL 5 MG PO TABS: 5.0000 mg | ORAL_TABLET | ORAL | 0 refills | Status: DC | PRN

## 2016-09-02 MED ORDER — OXYCODONE HCL 5 MG PO TABS
5.0000 mg | ORAL_TABLET | Freq: Once | ORAL | Status: AC
  Administered 2016-09-02: 5 mg via ORAL
  Filled 2016-09-02: qty 1

## 2016-09-02 MED ORDER — DOXYCYCLINE HYCLATE 100 MG PO CAPS: 100.0000 mg | ORAL_CAPSULE | Freq: Two times a day (BID) | ORAL | 0 refills | Status: DC

## 2016-09-02 NOTE — ED Triage Notes (Signed)
Patient here requesting her BP meds and ongoing leg pain and wounds bilateral, patient here with chronic vasculitis. Denies any new trauma, NAD

## 2016-09-02 NOTE — Consult Note (Signed)
Date: 09/02/2016               Patient Name:  [name] MRN: [id number]  DOB: [birth year] Age / Sex: 55 y.o., female   PCP: Zada Finders, MD         Requesting Physician: Dr. Deno Etienne, DO    Consulting Reason:  Assessment of chronic wounds in [facility] clinic patient     Chief Complaint: pain in legs and feet  History of Present Illness: [name] is a 54 year old woman with with history of cocaine abuse, levimasole vasculitis, and chronic wounds who presents with subacute worsening pain in her legs.  For the past 2-3 weeks she reports worsening pain in both lower legs exacerbated by weight bearing and walking.  She is able to to ambulate at home with a walker.  She was last seen at the wound care center for her chronic wounds about 2 weeks ago, and has a home health nurse who she has seen once since leaving the hospital one month ago.  In the past week, she ran out of oxycodone, doxycycline, and wound care supplies, prompting her to come in the ED today.   She denies fevers, anorexia, malodorous wounds, new purulent discharge.  She most recently used cocaine last week.  Meds: No current facility-administered medications for this encounter.    Current Outpatient Prescriptions  Medication Sig Dispense Refill  . doxycycline (VIBRA-TABS) 100 MG tablet Take 1 tablet (100 mg total) by mouth every 12 (twelve) hours. 90 tablet 0  . doxycycline (VIBRAMYCIN) 100 MG capsule Take 1 capsule (100 mg total) by mouth 2 (two) times daily. 20 capsule 0  . lisinopril (PRINIVIL,ZESTRIL) 10 MG tablet Take 2 tablets (20 mg total) by mouth daily. 30 tablet 5  . oxyCODONE (OXY IR/ROXICODONE) 5 MG immediate release tablet Take 1 tablet (5 mg total) by mouth every 8 (eight) hours as needed for moderate pain. 15 tablet 0  . oxyCODONE (ROXICODONE) 5 MG immediate release tablet Take 1 tablet (5 mg total) by mouth every 4 (four) hours as needed for severe pain. 15 tablet 0  . oxyCODONE (ROXICODONE) 5 MG immediate  release tablet Take 1 tablet (5 mg total) by mouth every 4 (four) hours as needed for severe pain. 8 tablet 0    Allergies: Allergies as of 09/02/2016 - Review Complete 09/02/2016  Allergen Reaction Noted  . Acetaminophen Swelling and Other (See Comments)    Past Medical History:  Diagnosis Date  . CAP (community acquired pneumonia) 03/2014 X 2  . Cocaine abuse    ongoing with resultant vaculitis.  . Depression   . Headache    "weekly" (07/29/2016)  . Hypertension   . Inflammatory arthritis   . Migraines    "probably 5-6/yr" (07/29/2016)  . Normocytic anemia    BL Hgb 9.8-12. Last anemia panel 04/2010 - showing Fe 19, ferritin 101.  Pt on monthly B12 injections  . Rheumatoid arthritis(714.0)    patient reported  . 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 Followed by Dr. Louanne Skye   Past Surgical History:  Procedure Laterality Date  . AMPUTATION Left 05/22/2016   Procedure: AMPUTATION LEFT LONG FINGER;  Surgeon: Marybelle Killings, MD;  Location: [place];  Service: Orthopedics;  Laterality: Left;  . HERNIA REPAIR     "stomach"  . I&D EXTREMITY Right 09/26/2015   Procedure: IRRIGATION AND DEBRIDEMENT LEG WOUND  VAC PLACEMENT.;  Surgeon: Lyndee Leo  S Dillingham, DO;  Location: [place];  Service: Plastics;  Laterality: Right;  . IRRIGATION AND DEBRIDEMENT ABSCESS Bilateral 09/26/2013   Procedure: DEBRIDEMENT ULCERS BILATERAL THIGHS;  Surgeon: Gwenyth Ober, MD;  Location: [place];  Service: General;  Laterality: Bilateral;  . SKIN BIOPSY Bilateral    shin nodules   Family History  Problem Relation Age of Onset  . Breast cancer Mother     Breast cancer  . Alcohol abuse Mother   . Colon cancer Maternal Aunt 43  . Alcohol abuse Father    Social History   Social History  . Marital status: Single    Spouse name: N/A  . Number of children: 0  . Years of education: 11th grade   Occupational History  . Disability     since 2011, due to  her rheumatoid arthritis   Social History Main Topics  . Smoking status: Current Every Day Smoker    Packs/day: 0.12    Years: 38.00    Types: Cigarettes  . Smokeless tobacco: Never Used  . Alcohol use No  . Drug use:     Types: "Crack" cocaine, Cocaine     Comment: 07/29/2016 "only use the one you smoke now., last used ~ 1-2 wk ago"  . Sexual activity: Not Currently   Other Topics Concern  . Not on file   Social History Narrative   Unemployed:  cleaning in past   Living at [place]; has Medicaid.   crack/cocaine use; pt denies IVDU   tobacco:  1/2 ppd, trying to quit   alcohol:  none        Review of Systems: Review of Systems  Constitutional: Positive for chills. Negative for fever.  Respiratory: Negative for cough and shortness of breath.   Cardiovascular: Negative for chest pain and palpitations.  Gastrointestinal: Positive for nausea and vomiting.  Musculoskeletal: Positive for joint pain and myalgias. Negative for falls.  Skin:       Chronic wounds Worst on L medial ankle  Psychiatric/Behavioral: Positive for substance abuse.     Physical Exam: Blood pressure (!) 205/72, pulse 94, temperature 98.5 F (36.9 C), temperature source Oral, resp. rate 20, SpO2 100 %.  Physical Exam  Constitutional: She is oriented to person, place, and time.  Cachectic, chronically ill appearing woman in no distress  HENT:  Head: Normocephalic and atraumatic.  Eyes: Conjunctivae are normal. No scleral icterus.  Neck: Normal range of motion. Neck supple.  Cardiovascular: Normal rate and regular rhythm.   Pulmonary/Chest: Effort normal and breath sounds normal.  Abdominal: Soft. She exhibits no distension. There is no tenderness.  Musculoskeletal: She exhibits no edema.  Tender to palpation around wounds  Neurological: She is alert and oriented to person, place, and time.  Skin:  Large posterior R calf wound  3x3cm medial left ankle wound Both without purulence,  surrounding erythema or induration, necrotic tissue See pictures  Psychiatric: She has a normal mood and affect. Her behavior is normal.          Lab results: CBC Latest Ref Rng & Units 08/12/2016 08/01/2016 07/25/2016  WBC 4.0 - 10.5 K/uL 5.7 10.2 9.7  Hemoglobin 12.0 - 15.0 g/dL 11.1(L) 10.3(L) 13.2  Hematocrit 36.0 - 46.0 % 32.9(L) 31.3(L) 39.5  Platelets 150 - 400 K/uL 446(H) 390 317    Imaging results:  Dg Tibia/fibula Left  Result Date: 09/02/2016 CLINICAL DATA:  Bilateral leg pain and skin wounds, initial encounter EXAM: LEFT TIBIA AND FIBULA - 2  VIEW COMPARISON:  08/12/2016 FINDINGS: Enlarging soft tissue wound is noted along the distal aspect of the tibia. No underlying bony destruction is seen. No other soft tissue abnormality is noted. IMPRESSION: Soft tissue wound enlarging in size without evidence of underlying osteomyelitis. Electronically Signed   By: Inez Catalina M.D.   On: 09/02/2016 16:08   Dg Tibia/fibula Right  Result Date: 09/02/2016 CLINICAL DATA:  Leg pain and skin wounds, subsequent encounter EXAM: RIGHT TIBIA AND FIBULA - 2 VIEW COMPARISON:  08/12/2016 FINDINGS: Posterior skin wound is noted distally. No underlying bony abnormality is identified. IMPRESSION: Soft tissue wound without acute bony abnormality. Electronically Signed   By: Inez Catalina M.D.   On: 09/02/2016 16:09    Assessment, Plan, & Recommendations by Problem:  55 year old woman with ongoing cocaine abuse and chronic vasculitic wounds due to her cocaine abuse.  Her wounds do not appear acutely infected and she is nontoxic and without evidence of systemic infection or osteomyelitis.  Her presentation at this time seems to have been driven by running out of meds and supplies.  #Chronic Wound #Levimasole Vasculitis -Short course of opiates for acute pain (6-8 pills 5 mg oxycodone) -Refill doxycycline 100 mg BID for possible chronic osteomyelitis for deep chronic wounds -Clean dry dressings on  wounds -F/u in [facility] clinic next week  Dispo: Recommend discharge home with [facility] clinic follow-up next week.   The patient does have a current PCP (Zada Finders, MD) and does need an [facility] hospital follow-up appointment after discharge.  Unclear if this patient have transportation limitations that hinder transportation to clinic appointments.  Signed: Minus Liberty, MD 09/02/2016, 4:59 PM

## 2016-09-02 NOTE — Care Management (Signed)
ED CM received consult concerning follow-up for bilateral leg wound .CM reviewed record, patient is an ex\stablsihed patient at the [facility]. Patient apparently was a no show at the [facility]  11/16.  Follow up appt.Tuesday 12/5 at 3:30.  Patient is agreeable to follow up and call [facility] Transportation. [phone] by Monday 12/3. Patient verbalized understanding. No further questions or concerns noted. No further CM needs identified.

## 2016-09-02 NOTE — Discharge Instructions (Signed)
Follow up with your doc.

## 2016-09-02 NOTE — ED Provider Notes (Signed)
[facility] DEPT Provider Note   CSN: [id number] Arrival date & time: 09/02/16  1148  By signing my name below, I, Evelene Croon, attest that this documentation has been prepared under the direction and in the presence of Deno Etienne, DO . Electronically Signed: Evelene Croon, Scribe. 09/02/2016. 2:53 PM. History   Chief Complaint Chief Complaint  Patient presents with  . Wound Check    The history is provided by the patient. No language interpreter was used.  Wound Check  This is a chronic problem. The problem occurs constantly. The problem has been gradually worsening. Pertinent negatives include no chest pain, no headaches and no shortness of breath. Nothing relieves the symptoms.     HPI Comments:  [name] is a 55 y.o. female with a history of cocaine-induced vascular disorder who presents to the Emergency Department for a wound check of chronic wounds to the BLE. The current bandage in place has been on the wound for greater than 1 week because she does not have bandages to change it with. She reports worsening 10/10 pain to the site. Pt reports associated chills. No alleviating factors noted.   Past Medical History:  Diagnosis Date  . CAP (community acquired pneumonia) 03/2014 X 2  . Cocaine abuse    ongoing with resultant vaculitis.  . Depression   . Headache    "weekly" (07/29/2016)  . Hypertension   . Inflammatory arthritis   . Migraines    "probably 5-6/yr" (07/29/2016)  . Normocytic anemia    BL Hgb 9.8-12. Last anemia panel 04/2010 - showing Fe 19, ferritin 101.  Pt on monthly B12 injections  . Rheumatoid arthritis(714.0)    patient reported  . 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 Followed by Dr. Louanne Skye    Patient Active Problem List   Diagnosis Date Noted  . Palliative care encounter   . Wound drainage   . Infectious myositis   . Wound dehiscence   . Finger necrosis ([place]) 05/18/2016  . AKI  (acute kidney injury) ([place])   . Leg ulcer ([place])   . 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 ([place]) 06/04/2015  . Pressure ulcer 04/22/2015  . Skin necrosis ([place]) 04/21/2015  . Protein-calorie malnutrition, severe ([place]) 01/15/2015  . Severe recurrent major depression without psychotic features ([place]) 10/16/2014  . Cocaine use disorder, severe, dependence ([place]) 10/10/2014  . Cannabis use disorder, severe, dependence ([place]) 10/10/2014  . Substance induced mood disorder ([place]) 10/09/2014  . 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 Hepatomegaly 04/11/2014  . Acute pericarditis 03/31/2014  . Inflammatory arthritis 09/24/2013  . 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 Tobacco abuse 01/03/2013  . hyperlipidemia 01/03/2013  . Essential hypertension 02/26/2010    Past Surgical History:  Procedure Laterality Date  . AMPUTATION Left 05/22/2016   Procedure: AMPUTATION LEFT LONG FINGER;  Surgeon: Marybelle Killings, MD;  Location: [place];  Service: Orthopedics;  Laterality: Left;  . HERNIA REPAIR     "stomach"  . I&D EXTREMITY Right 09/26/2015   Procedure: IRRIGATION AND DEBRIDEMENT LEG WOUND  VAC PLACEMENT.;  Surgeon: Loel Lofty Dillingham, DO;  Location: [place];  Service: Plastics;  Laterality: Right;  . IRRIGATION AND DEBRIDEMENT ABSCESS Bilateral 09/26/2013   Procedure: DEBRIDEMENT ULCERS BILATERAL THIGHS;  Surgeon: Gwenyth Ober, MD;  Location: [place];  Service: General;  Laterality: Bilateral;  . SKIN BIOPSY Bilateral    shin nodules    OB History  No data available       Home Medications    Prior to Admission medications   Medication Sig Start Date End Date Taking? Authorizing Provider  doxycycline (VIBRA-TABS) 100 MG tablet Take 1 tablet (100 mg total) by mouth every 12 (twelve) hours. 07/29/16   Lorella Nimrod, MD  doxycycline (VIBRAMYCIN) 100 MG capsule Take 1 capsule (100 mg total) by mouth 2 (two) times daily. 09/02/16   Deno Etienne, DO  lisinopril (PRINIVIL,ZESTRIL) 10 MG tablet  Take 2 tablets (20 mg total) by mouth daily. 07/29/16   Lorella Nimrod, MD  oxyCODONE (OXY IR/ROXICODONE) 5 MG immediate release tablet Take 1 tablet (5 mg total) by mouth every 8 (eight) hours as needed for moderate pain. 07/29/16   Lorella Nimrod, MD  oxyCODONE (ROXICODONE) 5 MG immediate release tablet Take 1 tablet (5 mg total) by mouth every 4 (four) hours as needed for severe pain. 08/12/16   Recardo Evangelist, PA-C  oxyCODONE (ROXICODONE) 5 MG immediate release tablet Take 1 tablet (5 mg total) by mouth every 4 (four) hours as needed for severe pain. 09/02/16   Deno Etienne, DO    Family History Family History  Problem Relation Age of Onset  . Breast cancer Mother     Breast cancer  . Alcohol abuse Mother   . Colon cancer Maternal Aunt 32  . Alcohol abuse Father     Social History Social History  Substance Use Topics  . Smoking status: Current Every Day Smoker    Packs/day: 0.12    Years: 38.00    Types: Cigarettes  . Smokeless tobacco: Never Used  . Alcohol use No     Allergies   Acetaminophen   Review of Systems Review of Systems  Constitutional: Positive for chills. Negative for fever.  HENT: Negative for congestion and rhinorrhea.   Eyes: Negative for redness and visual disturbance.  Respiratory: Negative for shortness of breath and wheezing.   Cardiovascular: Negative for chest pain and palpitations.  Gastrointestinal: Negative for nausea and vomiting.  Genitourinary: Negative for dysuria and urgency.  Musculoskeletal: Negative for arthralgias and myalgias.  Skin: Positive for wound. Negative for pallor.  Neurological: Negative for dizziness and headaches.     Physical Exam Updated Vital Signs BP (!) 205/72 (BP Location: Right Arm)   Pulse 94   Temp 98.5 F (36.9 C) (Oral)   Resp 20   SpO2 100%   Physical Exam  Constitutional: She is oriented to person, place, and time. She appears well-developed and well-nourished. No distress.  HENT:  Head:  Normocephalic and atraumatic.  Eyes: EOM are normal. Pupils are equal, round, and reactive to light.  Neck: Normal range of motion. Neck supple.  Cardiovascular: Normal rate and regular rhythm.  Exam reveals no gallop and no friction rub.   No murmur heard. Pulmonary/Chest: Effort normal. She has no wheezes. She has no rales.  Abdominal: Soft. She exhibits no distension. There is no tenderness.  Musculoskeletal: She exhibits no edema or tenderness.  Neurological: She is alert and oriented to person, place, and time.  Skin: Skin is warm and dry. She is not diaphoretic.  Chronic appearing venous stasis wounds: the left is just above the medial malleolus and right one is on the  posterior calf  No noted drainage  Wounds are pink with healthy appearing tissue   Psychiatric: She has a normal mood and affect. Her behavior is normal.  Nursing note and vitals reviewed.    ED Treatments / Results  DIAGNOSTIC  STUDIES:  Oxygen Saturation is 100% on RA, normal by my interpretation.    COORDINATION OF CARE:  2:52 PM Discussed treatment plan with pt at bedside and pt agreed to plan.  Labs (all labs ordered are listed, but only abnormal results are displayed) Labs Reviewed - No data to display  EKG  EKG Interpretation None       Radiology Dg Tibia/fibula Left  Result Date: 09/02/2016 CLINICAL DATA:  Bilateral leg pain and skin wounds, initial encounter EXAM: LEFT TIBIA AND FIBULA - 2 VIEW COMPARISON:  08/12/2016 FINDINGS: Enlarging soft tissue wound is noted along the distal aspect of the tibia. No underlying bony destruction is seen. No other soft tissue abnormality is noted. IMPRESSION: Soft tissue wound enlarging in size without evidence of underlying osteomyelitis. Electronically Signed   By: Inez Catalina M.D.   On: 09/02/2016 16:08   Dg Tibia/fibula Right  Result Date: 09/02/2016 CLINICAL DATA:  Leg pain and skin wounds, subsequent encounter EXAM: RIGHT TIBIA AND FIBULA - 2 VIEW  COMPARISON:  08/12/2016 FINDINGS: Posterior skin wound is noted distally. No underlying bony abnormality is identified. IMPRESSION: Soft tissue wound without acute bony abnormality. Electronically Signed   By: Inez Catalina M.D.   On: 09/02/2016 16:09    Procedures Procedures (including critical care time)  Medications Ordered in ED Medications  ketorolac (TORADOL) injection 30 mg (30 mg Intramuscular Given 09/02/16 1512)  oxyCODONE (Oxy IR/ROXICODONE) immediate release tablet 5 mg (5 mg Oral Given 09/02/16 1511)     Initial Impression / Assessment and Plan / ED Course  I have reviewed the triage vital signs and the nursing notes.  Pertinent labs & imaging results that were available during my care of the patient were reviewed by me and considered in my medical decision making (see chart for details).  Clinical Course     55 yo F with chronic leg wounds from cocaine abuse.  She feels like they are worsening.  They have not been checked in at least a week looking at the bandages.  Wounds themselves look well compared to prior images.  IM residency program patient, they eval at bedside and agree that the wounds look well.  D/c home with doxy per them, case management eval for home health and wound clinic follow up.    I have discussed the diagnosis/risks/treatment options with the patient and believe the pt to be eligible for discharge home to follow-up with PCP. We also discussed returning to the ED immediately if new or worsening sx occur. We discussed the sx which are most concerning (e.g., sudden worsening pain, fever, inability to tolerate by mouth) that necessitate immediate return. Medications administered to the patient during their visit and any new prescriptions provided to the patient are listed below.  Medications given during this visit Medications  ketorolac (TORADOL) injection 30 mg (30 mg Intramuscular Given 09/02/16 1512)  oxyCODONE (Oxy IR/ROXICODONE) immediate release  tablet 5 mg (5 mg Oral Given 09/02/16 1511)     The patient appears reasonably screen and/or stabilized for discharge and I doubt any other medical condition or other [facility] requiring further screening, evaluation, or treatment in the ED at this time prior to discharge.    Final Clinical Impressions(s) / ED Diagnoses   Final diagnoses:  Visit for wound check    New Prescriptions Discharge Medication List as of 09/02/2016  4:43 PM    START taking these medications   Details  doxycycline (VIBRAMYCIN) 100 MG capsule Take 1 capsule (100 mg  total) by mouth 2 (two) times daily., Starting Wed 09/02/2016, Print    !! oxyCODONE (ROXICODONE) 5 MG immediate release tablet Take 1 tablet (5 mg total) by mouth every 4 (four) hours as needed for severe pain., Starting Wed 09/02/2016, Print     !! - Potential duplicate medications found. Please discuss with provider.     I personally performed the services described in this documentation, which was scribed in my presence. The recorded information has been reviewed and is accurate.     Deno Etienne, DO 09/04/16 [id number]

## 2016-09-09 ENCOUNTER — Encounter (HOSPITAL_COMMUNITY): Payer: Self-pay | Admitting: Emergency Medicine

## 2016-09-09 ENCOUNTER — Emergency Department (HOSPITAL_COMMUNITY): Payer: Medicaid Other

## 2016-09-09 ENCOUNTER — Observation Stay (HOSPITAL_COMMUNITY)
Admission: EM | Admit: 2016-09-09 | Discharge: 2016-09-11 | Disposition: A | Discharge status: Home / Self Care | Payer: Medicaid Other | Attending: Internal Medicine | Admitting: Internal Medicine

## 2016-09-09 DIAGNOSIS — T405X1A Poisoning by cocaine, accidental (unintentional), initial encounter: Secondary | ICD-10-CM | POA: Diagnosis not present

## 2016-09-09 DIAGNOSIS — Z79899 Other long term (current) drug therapy: Secondary | ICD-10-CM

## 2016-09-09 DIAGNOSIS — G8929 Other chronic pain: Secondary | ICD-10-CM | POA: Diagnosis not present

## 2016-09-09 DIAGNOSIS — E43 Unspecified severe protein-calorie malnutrition: Secondary | ICD-10-CM | POA: Diagnosis not present

## 2016-09-09 DIAGNOSIS — F1220 Cannabis dependence, uncomplicated: Secondary | ICD-10-CM | POA: Insufficient documentation

## 2016-09-09 DIAGNOSIS — T8130XA Disruption of wound, unspecified, initial encounter: Secondary | ICD-10-CM | POA: Diagnosis not present

## 2016-09-09 DIAGNOSIS — N179 Acute kidney failure, unspecified: Secondary | ICD-10-CM | POA: Diagnosis not present

## 2016-09-09 DIAGNOSIS — D649 Anemia, unspecified: Secondary | ICD-10-CM | POA: Insufficient documentation

## 2016-09-09 DIAGNOSIS — Z800 Family history of malignant neoplasm of digestive organs: Secondary | ICD-10-CM | POA: Diagnosis not present

## 2016-09-09 DIAGNOSIS — F14988 Cocaine use, unspecified with other cocaine-induced disorder: Secondary | ICD-10-CM | POA: Diagnosis present

## 2016-09-09 DIAGNOSIS — I309 Acute pericarditis, unspecified: Secondary | ICD-10-CM | POA: Insufficient documentation

## 2016-09-09 DIAGNOSIS — E785 Hyperlipidemia, unspecified: Secondary | ICD-10-CM | POA: Diagnosis not present

## 2016-09-09 DIAGNOSIS — I776 Arteritis, unspecified: Secondary | ICD-10-CM | POA: Insufficient documentation

## 2016-09-09 DIAGNOSIS — F332 Major depressive disorder, recurrent severe without psychotic features: Secondary | ICD-10-CM | POA: Insufficient documentation

## 2016-09-09 DIAGNOSIS — I10 Essential (primary) hypertension: Secondary | ICD-10-CM | POA: Diagnosis not present

## 2016-09-09 DIAGNOSIS — M941 Relapsing polychondritis: Secondary | ICD-10-CM | POA: Insufficient documentation

## 2016-09-09 DIAGNOSIS — L958 Other vasculitis limited to the skin: Secondary | ICD-10-CM | POA: Diagnosis not present

## 2016-09-09 DIAGNOSIS — F1420 Cocaine dependence, uncomplicated: Secondary | ICD-10-CM | POA: Diagnosis not present

## 2016-09-09 DIAGNOSIS — Z811 Family history of alcohol abuse and dependence: Secondary | ICD-10-CM | POA: Diagnosis not present

## 2016-09-09 DIAGNOSIS — Z886 Allergy status to analgesic agent status: Secondary | ICD-10-CM

## 2016-09-09 DIAGNOSIS — M60009 Infective myositis, unspecified site: Secondary | ICD-10-CM | POA: Insufficient documentation

## 2016-09-09 DIAGNOSIS — F17210 Nicotine dependence, cigarettes, uncomplicated: Secondary | ICD-10-CM | POA: Insufficient documentation

## 2016-09-09 DIAGNOSIS — F14288 Cocaine dependence with other cocaine-induced disorder: Secondary | ICD-10-CM | POA: Diagnosis not present

## 2016-09-09 DIAGNOSIS — G43909 Migraine, unspecified, not intractable, without status migrainosus: Secondary | ICD-10-CM | POA: Insufficient documentation

## 2016-09-09 DIAGNOSIS — Z79891 Long term (current) use of opiate analgesic: Secondary | ICD-10-CM

## 2016-09-09 DIAGNOSIS — M069 Rheumatoid arthritis, unspecified: Secondary | ICD-10-CM | POA: Diagnosis not present

## 2016-09-09 DIAGNOSIS — I999 Unspecified disorder of circulatory system: Secondary | ICD-10-CM

## 2016-09-09 DIAGNOSIS — Z803 Family history of malignant neoplasm of breast: Secondary | ICD-10-CM

## 2016-09-09 LAB — BASIC METABOLIC PANEL
ANION GAP: 7 (ref 5–15)
BUN: 34 mg/dL — ABNORMAL HIGH (ref 6–20)
CALCIUM: 9.6 mg/dL (ref 8.9–10.3)
CO2: 25 mmol/L (ref 22–32)
CREATININE: 1.76 mg/dL — AB (ref 0.44–1.00)
Chloride: 107 mmol/L (ref 101–111)
GFR, EST AFRICAN AMERICAN: 37 mL/min — AB (ref >60)
GFR, EST NON AFRICAN AMERICAN: 32 mL/min — AB (ref >60)
GLUCOSE: 137 mg/dL — AB (ref 65–99)
Potassium: 4.2 mmol/L (ref 3.5–5.1)
Sodium: 139 mmol/L (ref 135–145)

## 2016-09-09 LAB — URINALYSIS, ROUTINE W REFLEX MICROSCOPIC
Bilirubin Urine: NEGATIVE
GLUCOSE, UA: NEGATIVE mg/dL
Hgb urine dipstick: NEGATIVE
Ketones, ur: NEGATIVE mg/dL
NITRITE: NEGATIVE
PH: 5 (ref 5.0–8.0)
Protein, ur: NEGATIVE mg/dL
SPECIFIC GRAVITY, URINE: 1.023 (ref 1.005–1.030)

## 2016-09-09 LAB — CBC WITH DIFFERENTIAL/PLATELET
BASOS ABS: 0 10*3/uL (ref 0.0–0.1)
BASOS PCT: 0 %
EOS ABS: 0.1 10*3/uL (ref 0.0–0.7)
Eosinophils Relative: 1 %
HCT: 35.2 % — ABNORMAL LOW (ref 36.0–46.0)
Hemoglobin: 11.7 g/dL — ABNORMAL LOW (ref 12.0–15.0)
Lymphocytes Relative: 27 %
Lymphs Abs: 2.8 10*3/uL (ref 0.7–4.0)
MCH: 28.3 pg (ref 26.0–34.0)
MCHC: 33.2 g/dL (ref 30.0–36.0)
MCV: 85.2 fL (ref 78.0–100.0)
MONO ABS: 0.6 10*3/uL (ref 0.1–1.0)
MONOS PCT: 6 %
NEUTROS ABS: 6.6 10*3/uL (ref 1.7–7.7)
NEUTROS PCT: 66 %
Platelets: 350 10*3/uL (ref 150–400)
RBC: 4.13 MIL/uL (ref 3.87–5.11)
RDW: 14 % (ref 11.5–15.5)
WBC: 10.1 10*3/uL (ref 4.0–10.5)

## 2016-09-09 LAB — SEDIMENTATION RATE: SED RATE: 65 mm/h — AB (ref 0–22)

## 2016-09-09 LAB — MRSA PCR SCREENING: MRSA BY PCR: NEGATIVE

## 2016-09-09 MED ORDER — MORPHINE SULFATE (PF) 4 MG/ML IV SOLN
1.0000 mg | INTRAVENOUS | Status: DC | PRN
  Administered 2016-09-09 – 2016-09-10 (×6): 1 mg via INTRAVENOUS
  Filled 2016-09-09 (×6): qty 1

## 2016-09-09 MED ORDER — DOXYCYCLINE HYCLATE 100 MG PO TABS
100.0000 mg | ORAL_TABLET | Freq: Two times a day (BID) | ORAL | Status: DC
  Administered 2016-09-09 – 2016-09-11 (×4): 100 mg via ORAL
  Filled 2016-09-09 (×4): qty 1

## 2016-09-09 MED ORDER — MORPHINE SULFATE (PF) 4 MG/ML IV SOLN: 2.0000 mg | Freq: Once | INTRAVENOUS | Status: DC

## 2016-09-09 MED ORDER — DOXYCYCLINE HYCLATE 100 MG PO CAPS: 100.0000 mg | ORAL_CAPSULE | Freq: Two times a day (BID) | ORAL | Status: DC

## 2016-09-09 MED ORDER — FENTANYL CITRATE (PF) 100 MCG/2ML IJ SOLN
50.0000 ug | Freq: Once | INTRAMUSCULAR | Status: AC
  Administered 2016-09-09: 50 ug via INTRAVENOUS
  Filled 2016-09-09: qty 2

## 2016-09-09 MED ORDER — SODIUM CHLORIDE 0.9 % IV SOLN
INTRAVENOUS | Status: AC
  Administered 2016-09-09: 100 mL/h via INTRAVENOUS

## 2016-09-09 MED ORDER — KETOROLAC TROMETHAMINE 30 MG/ML IJ SOLN
30.0000 mg | Freq: Once | INTRAMUSCULAR | Status: AC
  Administered 2016-09-09: 30 mg via INTRAMUSCULAR
  Filled 2016-09-09: qty 1

## 2016-09-09 MED ORDER — MORPHINE SULFATE (PF) 4 MG/ML IV SOLN
4.0000 mg | Freq: Once | INTRAVENOUS | Status: AC
  Administered 2016-09-09: 4 mg via INTRAVENOUS
  Filled 2016-09-09: qty 1

## 2016-09-09 MED ORDER — SODIUM CHLORIDE 0.9 % IV BOLUS (SEPSIS)
1000.0000 mL | Freq: Once | INTRAVENOUS | Status: AC
  Administered 2016-09-09: 1000 mL via INTRAVENOUS

## 2016-09-09 MED ORDER — HYDRALAZINE HCL 20 MG/ML IJ SOLN
10.0000 mg | Freq: Four times a day (QID) | INTRAMUSCULAR | Status: DC | PRN
  Administered 2016-09-09: 10 mg via INTRAVENOUS
  Filled 2016-09-09: qty 1

## 2016-09-09 MED ORDER — ENSURE ENLIVE PO LIQD
237.0000 mL | Freq: Two times a day (BID) | ORAL | Status: DC
  Administered 2016-09-10 – 2016-09-11 (×2): 237 mL via ORAL

## 2016-09-09 MED ORDER — OXYCODONE HCL 5 MG PO TABS
5.0000 mg | ORAL_TABLET | Freq: Three times a day (TID) | ORAL | Status: DC | PRN
  Administered 2016-09-09 – 2016-09-11 (×4): 5 mg via ORAL
  Filled 2016-09-09 (×4): qty 1

## 2016-09-09 MED ORDER — OXYCODONE HCL 5 MG PO TABS
5.0000 mg | ORAL_TABLET | Freq: Once | ORAL | Status: AC
  Administered 2016-09-09: 5 mg via ORAL
  Filled 2016-09-09: qty 1

## 2016-09-09 MED ORDER — HEPARIN SODIUM (PORCINE) 5000 UNIT/ML IJ SOLN
5000.0000 [IU] | Freq: Three times a day (TID) | INTRAMUSCULAR | Status: DC
  Administered 2016-09-09 – 2016-09-11 (×5): 5000 [IU] via SUBCUTANEOUS
  Filled 2016-09-09 (×5): qty 1

## 2016-09-09 NOTE — ED Triage Notes (Signed)
Per EMS: pt from home c/o leg pain from chronic leg pain with vasculitis pain worse today

## 2016-09-09 NOTE — ED Notes (Signed)
Pt crying loudly and rolling around on bed. Asking for pain medication.

## 2016-09-09 NOTE — ED Provider Notes (Signed)
[name]   CSN: [id number] Arrival date & time: 09/09/16  0705     History   Chief Complaint Chief Complaint  Patient presents with  . Leg Pain  . Wound Check    HPI [name] is a 55 y.o. female.  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 States [name] was unable to sleep less like due to this. [name] states [name] missed her most recent appointment with the internal medicine teaching service yesterday. No fevers. Chronic drainage but not changed. Is out of the oxycodone [name] was given last ED visit. Last did cocaine 3 days ago. Reports [name] is taking her doxycycline as prescribed.  Past Medical History:  Diagnosis Date  . CAP (community acquired pneumonia) 03/2014 X 2  . Cocaine abuse    ongoing with resultant vaculitis.  . Depression   . Headache    "weekly" (07/29/2016)  . Hypertension   . Inflammatory arthritis   . Migraines    "probably 5-6/yr" (07/29/2016)  . Normocytic anemia    BL Hgb 9.8-12. Last anemia panel 04/2010 - showing Fe 19, ferritin 101.  Pt on monthly B12 injections  . Rheumatoid arthritis(714.0)    patient reported  . 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 Followed by Dr. Louanne Skye    Patient Active Problem List   Diagnosis Date Noted  . Acute kidney injury ([place]) 09/09/2016  . Palliative care encounter   . Wound drainage   . Infectious myositis   . Wound dehiscence   . Finger necrosis ([place]) 05/18/2016  . AKI (acute kidney injury) ([place])   . Leg ulcer ([place])   . 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 ([place]) 06/04/2015  . Pressure ulcer 04/22/2015  . Skin necrosis ([place]) 04/21/2015  . Protein-calorie malnutrition, severe ([place]) 01/15/2015  . Severe recurrent major depression without psychotic features ([place]) 10/16/2014  . Cocaine use disorder, severe,  dependence ([place]) 10/10/2014  . Cannabis use disorder, severe, dependence ([place]) 10/10/2014  . Substance induced mood disorder ([place]) 10/09/2014  . 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 Hepatomegaly 04/11/2014  . Acute pericarditis 03/31/2014  . Inflammatory arthritis 09/24/2013  . 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 Tobacco abuse 01/03/2013  . hyperlipidemia 01/03/2013  . Essential hypertension 02/26/2010    Past Surgical History:  Procedure Laterality Date  . AMPUTATION Left 05/22/2016   Procedure: AMPUTATION LEFT LONG FINGER;  Surgeon: Marybelle Killings, MD;  Location: [facility];  Service: Orthopedics;  Laterality: Left;  . HERNIA REPAIR     "stomach"  . I&D EXTREMITY Right 09/26/2015   Procedure: IRRIGATION AND DEBRIDEMENT LEG WOUND  VAC PLACEMENT.;  Surgeon: Loel Lofty Dillingham, DO;  Location: [facility];  Service: Plastics;  Laterality: Right;  . IRRIGATION AND DEBRIDEMENT ABSCESS Bilateral 09/26/2013   Procedure: DEBRIDEMENT ULCERS BILATERAL THIGHS;  Surgeon: Gwenyth Ober, MD;  Location: [facility];  Service: General;  Laterality: Bilateral;  . SKIN BIOPSY Bilateral    shin nodules    OB History    No data available       Home Medications    Prior to Admission medications   Medication Sig Start Date End Date Taking? Authorizing Provider  doxycycline (VIBRAMYCIN) 100 MG capsule Take 1 capsule (100 mg total) by mouth 2 (two) times daily. 09/02/16  Yes Deno Etienne,  DO  lisinopril (PRINIVIL,ZESTRIL) 10 MG tablet Take 2 tablets (20 mg total) by mouth daily. 07/29/16  Yes Lorella Nimrod, MD  naproxen sodium (ANAPROX) 220 MG tablet Take 440 mg by mouth daily as needed (pain).   Yes Historical Provider, MD  oxyCODONE (OXY IR/ROXICODONE) 5 MG immediate release tablet Take 1 tablet (5 mg total) by mouth every 8 (eight) hours as needed for moderate pain. Patient not taking: Reported on 09/09/2016 07/29/16   Lorella Nimrod, MD  oxyCODONE (ROXICODONE) 5 MG immediate release tablet Take 1  tablet (5 mg total) by mouth every 4 (four) hours as needed for severe pain. Patient not taking: Reported on 09/09/2016 08/12/16   Recardo Evangelist, PA-C  oxyCODONE (ROXICODONE) 5 MG immediate release tablet Take 1 tablet (5 mg total) by mouth every 4 (four) hours as needed for severe pain. Patient not taking: Reported on 09/09/2016 09/02/16   Deno Etienne, DO    Family History Family History  Problem Relation Age of Onset  . Breast cancer Mother     Breast cancer  . Alcohol abuse Mother   . Colon cancer Maternal Aunt 32  . Alcohol abuse Father     Social History Social History  Substance Use Topics  . Smoking status: Current Every Day Smoker    Packs/day: 0.12    Years: 38.00    Types: Cigarettes  . Smokeless tobacco: Never Used  . Alcohol use No     Allergies   Acetaminophen   Review of Systems Review of Systems  Constitutional: Negative for fever.  Gastrointestinal: Negative for vomiting.  Musculoskeletal: Positive for arthralgias.  Skin: Positive for wound.  All other systems reviewed and are negative.    Physical Exam Updated Vital Signs BP (!) 202/100   Pulse 96   SpO2 100%   Physical Exam  Constitutional: [name] is oriented to person, place, and time. [name] appears well-developed and well-nourished.  Laying in Biomedical scientist, interacting with cell phone  HENT:  Head: Normocephalic and atraumatic.  Right Ear: External ear normal.  Left Ear: External ear normal.  Nose: Nose normal.  Eyes: Right eye exhibits no discharge. Left eye exhibits no discharge.  Cardiovascular: Normal rate, regular rhythm and normal heart sounds.   Pulses:      Dorsalis pedis pulses are 2+ on the right side, and 2+ on the left side.  Pulmonary/Chest: Effort normal.  Abdominal: Soft. [name] exhibits no distension.  Musculoskeletal:  Bilateral feet are warm, 2+ pulses bilaterally. Chronic appearing wounds. No active drainage. No swelling. No redness/erythema outside of the wounds. Foul  smelling. Very small superficial wound to left 2nd digit.  Neurological: [name] is alert and oriented to person, place, and time.  Skin: Skin is warm and dry. [name] is not diaphoretic.  Nursing [name] and vitals reviewed.    ED Treatments / Results  Labs (all labs ordered are listed, but only abnormal results are displayed) Labs Reviewed  BASIC METABOLIC PANEL - Abnormal; Notable for the following:       Result Value   Glucose, Bld 137 (*)    BUN 34 (*)    Creatinine, Ser 1.76 (*)    GFR calc non Af Amer 32 (*)    GFR calc Af Amer 37 (*)    All other components within normal limits  CBC WITH DIFFERENTIAL/PLATELET - Abnormal; Notable for the following:    Hemoglobin 11.7 (*)    HCT 35.2 (*)    All other components within normal limits  SEDIMENTATION RATE -  Abnormal; Notable for the following:    Sed Rate 65 (*)    All other components within normal limits  URINALYSIS, ROUTINE W REFLEX MICROSCOPIC    EKG  EKG Interpretation None       Radiology Dg Tibia/fibula Right  Result Date: 09/09/2016 CLINICAL DATA:  Lower extremity wound.  Cocaine induced vasculitis. EXAM: RIGHT TIBIA AND FIBULA - 2 VIEW COMPARISON:  09/02/2016. FINDINGS: No acute bony or joint abnormality. Irregular prominent wound noted along the posterior aspect of the distal right lower extremity. No underlying bony lesion identified. No radiopaque foreign body . IMPRESSION: Prominent irregular wound again noted along the posterior aspect of the right lower extremity. No underlying bony abnormality. No radiopaque foreign body. Electronically Signed   By: Marcello Moores  Register   On: 09/09/2016 08:30   Dg Ankle Complete Left  Result Date: 09/09/2016 CLINICAL DATA:  Cocaine induced vasculitis with lower extremity wound EXAM: LEFT ANKLE COMPLETE - 3+ VIEW COMPARISON:  Left tibia and fibula films of 09/02/2016 FINDINGS: A soft tissue defect is noted along the distal left tibia medially. No underlying bony erosion or periosteal  reaction is seen. The ankle joint is unremarkable. Alignment is normal. IMPRESSION: Soft tissue defect within the distal left lower leg medially and posteriorly with no underlying bony abnormality. Electronically Signed   By: Ivar Drape M.D.   On: 09/09/2016 08:33   Dg Toe 2nd Left  Result Date: 09/09/2016 CLINICAL DATA:  Lower extremity wound.  Cocaine induced vasculitis. EXAM: LEFT SECOND TOE COMPARISON:  None. FINDINGS: Diffuse degenerative change. There is no evidence of fracture or dislocation. There is no evidence of arthropathy or other focal bone abnormality. Soft tissues are unremarkable. IMPRESSION: No acute or focal abnormality.  Diffuse degenerative change. Electronically Signed   By: Marcello Moores  Register   On: 09/09/2016 08:34    Procedures Procedures (including critical care time)  Medications Ordered in ED Medications  0.9 %  sodium chloride infusion (not administered)  morphine 4 MG/ML injection 1 mg (1 mg Intravenous Given 09/09/16 1027)  oxyCODONE (Oxy IR/ROXICODONE) immediate release tablet 5 mg (5 mg Oral Given 09/09/16 0747)  ketorolac (TORADOL) 30 MG/ML injection 30 mg (30 mg Intramuscular Given 09/09/16 0747)  sodium chloride 0.9 % bolus 1,000 mL (1,000 mLs Intravenous New Bag/Given 09/09/16 0932)  fentaNYL (SUBLIMAZE) injection 50 mcg (50 mcg Intravenous Given 09/09/16 0932)     Initial Impression / Assessment and Plan / ED Course  I have reviewed the triage vital signs and the nursing notes.  Pertinent labs & imaging results that were available during my care of the patient were reviewed by me and considered in my medical decision making (see chart for details).  Clinical Course as of Sep 10 1035  Wed Sep 09, 2016  0728 I believe patient's pain is likely acute on chronic. Will check labs, Xray foot given new small ulceration. Will consult IM after labs/xrays  [SG]  6295 Patient's white blood cell count is unremarkable. Sedimentation rate seems to be lower than when  checked about a month ago. However [name] appears to have AKI. [name] will be given IV fluids. Admit to internal medicine teaching service.  [SG]  [id number] IM teaching service to admit, med-surg obs  [SG]    Clinical Course User Index [SG] Sherwood Gambler, MD    Final Clinical Impressions(s) / ED Diagnoses   Final diagnoses:  Acute kidney injury [facility])    New Prescriptions New Prescriptions   No medications on file  Sherwood Gambler, MD 09/09/16 1036

## 2016-09-09 NOTE — Discharge Planning (Signed)
[name] [phone] called back to confirm RN services.

## 2016-09-09 NOTE — ED Notes (Signed)
Patient was given a sprite.

## 2016-09-09 NOTE — Care Management Note (Addendum)
Case Management Note  Patient Details  Name: [name] MRN: [id number] Date of Birth: Oct 15, 1960  Subjective/Objective:                  55 yo F with chronic leg wounds from cocaine abuse. From boarding home.  Action/Plan: Follow for disposition needs.   Expected Discharge Date:  09/11/16               Expected Discharge Plan:  [place]  In-House Referral:  NA  Discharge planning Services  CM Consult  [facility] Agency:   [facility]    Additional Comments: Pt currently active with [facility] for RN, services.  Resumption of care requested. [facility] called, no answer- will continue to call.  No DME needs identified at this time.  Fuller Mandril, RN 09/09/2016, 10:12 AM

## 2016-09-09 NOTE — ED Notes (Signed)
Pt crying out at present

## 2016-09-09 NOTE — ED Notes (Signed)
Meal tray ordered. MD states pt may eat.

## 2016-09-09 NOTE — ED Notes (Signed)
Admitting phy at bedside  

## 2016-09-09 NOTE — ED Notes (Signed)
Patient transported to X-ray 

## 2016-09-09 NOTE — H&P (Signed)
Date: 09/09/2016               Patient Name:  [name] MRN: [id number]  DOB: April 12, 1961 Age / Sex: 55 y.o., female   PCP: Zada Finders, MD         Medical Service: Internal Medicine Teaching Service         Attending Physician: Dr. Dareen Piano    First Contact: Dr. Heber Bent Pager: [phone]  Second Contact: Dr. 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 She has been hospitalized multiple times in the last 6 months related to her chronic lower extremity wounds from her vasculitis and continued cocaine use. She reports worsening leg pain for the last few days but denies any inciting event. She notes yellow drainage for her chronic leg wounds. She reports she developed a new lesion in the web between her right big toe and 2nd toe. She reports subjective fevers at home. She has tried taking Oxycodone 1-2 pills daily with minimal relief of her pain. She notes decreased appetite lately. She had been going to wound care but missed her appointment last week as she overslept. She reports taking her Doxycycline.   In the ED she was found to have an elevated Cr of 1.76 from baseline 0.88. She was given a 1 L bolus of NS.   Meds:  Current Meds  Medication Sig  . doxycycline (VIBRAMYCIN) 100 MG capsule Take 1 capsule (100 mg total) by mouth 2 (two) times daily.  Marland Kitchen lisinopril (PRINIVIL,ZESTRIL) 10 MG tablet Take 2 tablets (20 mg total) by mouth daily.  . naproxen sodium (ANAPROX) 220 MG tablet Take 440 mg by mouth daily as needed (pain).     Allergies: Allergies as of 09/09/2016 - Review Complete 09/09/2016  Allergen Reaction Noted    . Acetaminophen Swelling and Other (See Comments)    Past Medical History:  Diagnosis Date  . CAP (community acquired pneumonia) 03/2014 X 2  . Cocaine abuse    ongoing with resultant vaculitis.  . Depression   . Headache    "weekly" (07/29/2016)  . Hypertension   . Inflammatory arthritis   . Migraines    "probably 5-6/yr" (07/29/2016)  . Normocytic anemia    BL Hgb 9.8-12. Last anemia panel 04/2010 - showing Fe 19, ferritin 101.  Pt on monthly B12 injections  . Rheumatoid arthritis(714.0)    patient reported  . 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 Followed by Dr. Louanne Skye    Family History: Mother- breast CA, alcohol abuse. Father- alcohol abuse. Mat aunt- colon cancer.  Social History: Current everyday smoker, ~5 pack years. Denies alcohol use. Uses crack cocaine weekly- last use 4 days ago.  Review of Systems: A complete ROS was negative except as per HPI.   Physical Exam: Blood pressure (!) 190/122, pulse 109, temperature 98.2 F (36.8 C), temperature source Oral, resp. rate 16, SpO2 100 %. General: thin woman sitting up in bed, tearful, uncomfortable due to pain HEENT: [place]/AT, EOMI, sclera anicteric, mucus membranes moist CV: RRR, no m/g/r Pulm: CTA bilaterally,  breaths non-labored Abd: BS+, soft, non-tender  Ext: Large right posterior calf wound. Smaller left medial ankle wound. Small wound in web between right 1st and 2nd toes. All wounds are non-purulent, have good granulation tissue.  Neuro: alert and oriented x 3  Right tib/fib x-ray: Prominent wound on posterior aspect. No evidence of osteomyelitis. Left 2nd toe x-ray: No acute abnormality.  Left ankle x-ray: Soft tissue defect medially. No bony abnormality.   ESR 65 (down from 111 one month ago) Cr 1.76 (BL 0.8)  Assessment & Plan by Problem:  AKI: Cr 1.76 on admission with baseline 0.8. Patient reports decreased appetite over last few days. UA shows  trace leuks, 6-30 RBCs. No evidence of infection. Her AKI is likely due to volume depletion. She received 1 L NS bolus in the ED. Will give her some IVFs as well.  - Repeat bmet in AM - Obtain renal US if Cr does not improve in AM - Hold home Lisinopril   Acute on chronic bilateral leg pain due to chronic wounds from vasculitis: Pain not controlled at home with PO oxycodone. Her wounds actually look much better than when I saw them about 6 weeks ago. Does not appear to be any new infection to explain her worsening pain. Will try to get her pain under control with IV medication and then discharge her with PO pain meds hopefully tomorrow. Will have wound care see her as well. - Continue home Oxycodone 5 mg Q8H PRN - Start Morphine 1 mg Q4H PRN severe pain - Wound care consult  - Continue Doxycycline 100 mg BID (end date: through 09/12/16, will be 8 weeks total) - Counseled on stopping cocaine use  HTN: BPs elevated in 190s-200s on admission. Likely due to pain. She is on Lisinopril 20 mg daily at home. Will hold this given her AKI. Will try to get her pain under better control and use hydralazine PRN if BP still elevated. - Hold Lisinopril - Hydralazine 10 mg IV Q6H PRN SBP >180, DBP >100   Diet: Heart healthy DVT PPx: Heparin SQ Dispo: Admit patient to Observation with expected length of stay less than 2 midnights.  Signed: Juliet Rude, MD  Internal Medicine Resident, PGY-III 09/09/2016, 9:29 AM  Pager: [phone]

## 2016-09-10 DIAGNOSIS — I10 Essential (primary) hypertension: Secondary | ICD-10-CM

## 2016-09-10 DIAGNOSIS — F14288 Cocaine dependence with other cocaine-induced disorder: Secondary | ICD-10-CM | POA: Diagnosis not present

## 2016-09-10 DIAGNOSIS — L958 Other vasculitis limited to the skin: Secondary | ICD-10-CM | POA: Diagnosis not present

## 2016-09-10 DIAGNOSIS — N179 Acute kidney failure, unspecified: Secondary | ICD-10-CM | POA: Diagnosis not present

## 2016-09-10 LAB — BASIC METABOLIC PANEL
ANION GAP: 10 (ref 5–15)
BUN: 22 mg/dL — ABNORMAL HIGH (ref 6–20)
CHLORIDE: 112 mmol/L — AB (ref 101–111)
CO2: 18 mmol/L — AB (ref 22–32)
Calcium: 8.8 mg/dL — ABNORMAL LOW (ref 8.9–10.3)
Creatinine, Ser: 1.04 mg/dL — ABNORMAL HIGH (ref 0.44–1.00)
GFR calc Af Amer: >60 mL/min (ref >60)
GFR calc non Af Amer: >60 mL/min (ref >60)
GLUCOSE: 96 mg/dL (ref 65–99)
POTASSIUM: 4.3 mmol/L (ref 3.5–5.1)
Sodium: 140 mmol/L (ref 135–145)

## 2016-09-10 MED ORDER — LISINOPRIL 20 MG PO TABS
20.0000 mg | ORAL_TABLET | Freq: Every day | ORAL | Status: DC
  Administered 2016-09-10 – 2016-09-11 (×2): 20 mg via ORAL
  Filled 2016-09-10 (×2): qty 1

## 2016-09-10 MED ORDER — OXYCODONE HCL 5 MG PO TABS: 5.0000 mg | ORAL_TABLET | Freq: Three times a day (TID) | ORAL | 0 refills | Status: DC | PRN

## 2016-09-10 NOTE — Progress Notes (Signed)
Initial Nutrition Assessment  DOCUMENTATION CODES:   Underweight, Severe malnutrition in context of social or environmental circumstances  INTERVENTION:   -D/c Ensure Enlive po BID, each supplement provides 350 kcal and 20 grams of protein -Boost Breeze po TID, each supplement provides 250 kcal and 9 grams of protein -MVI daily  NUTRITION DIAGNOSIS:   Malnutrition related to social / environmental circumstances as evidenced by severe depletion of muscle mass, severe depletion of body fat.  GOAL:   Patient will meet greater than or equal to 90% of their needs  MONITOR:   PO intake, Supplement acceptance, Labs, Weight trends, Skin, I & O's  REASON FOR ASSESSMENT:   Malnutrition Screening Tool    ASSESSMENT:   Ms. 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 She has been hospitalized multiple times in the last 6 months related to her chronic lower extremity wounds from her vasculitis and continued cocaine use. She reports worsening leg pain for the last few days but denies any inciting event. She notes yellow drainage for her chronic leg wounds. She reports she developed a new lesion in the web between her right big toe and 2nd toe. She reports subjective fevers at home. She has tried taking Oxycodone 1-2 pills daily with minimal relief of her pain. She notes decreased appetite lately. She had been going to wound care but missed her appointment last week as she overslept. She reports taking her Doxycycline.   Pt admitted with AKI.   Pt sleeping soundly with blankets over her head at time of visit. RD did not disturb.   Pt is very familiar to this RD due to multiple previous admissions. Pt with hx of levamisole vasculitis secondary to cocaine abuse. Per [facility] note, pt with areas on RL, lt medial leg, and lt great toe.   Wt has  remained stable. Pt with hx of severe malnutrition of social and environmental circumstances, which is ongoing. Last NFPE completed on 06/16/16 (which revealed severe fat and muscle depletion), however, this RD suspects no changes to exam at this time.   Meal completion 75%. Pt currently ordered Ensure supplements, however, pt prefers [employer]. RD will modify orders per pt preference.   Labs reviewed.   Diet Order:  Diet Heart Room service appropriate? Yes; Fluid consistency: Thin Diet - low sodium heart healthy  Skin:  Wound (see comment) (levamisole vasculitis on RLE, lt lateral leg, and lt great toe)  Last BM:  09/09/16  Height:   Ht Readings from Last 1 Encounters:  09/09/16 5' 1"  (1.549 m)    Weight:   Wt Readings from Last 1 Encounters:  09/09/16 94 lb 14.4 oz (43 kg)    Ideal Body Weight:  47.7 kg  BMI:  Body mass index is 17.93 kg/m.  Estimated Nutritional Needs:   Kcal:  1500-1700  Protein:  70-85 grams  Fluid:  >1.5 L  EDUCATION NEEDS:   No education needs identified at this time  Latravious Levitt A. Jimmye Norman, RD, LDN, CDE Pager: [phone] After hours Pager: [phone]

## 2016-09-10 NOTE — Progress Notes (Signed)
   Subjective: Patient was evaluated this morning on rounds. She states that she continues to have leg pain. She denies fever chills nausea or vomiting.  Objective:  Vital signs in last 24 hours: Vitals:   09/10/16 0502 09/10/16 1200 09/10/16 1201 09/10/16 1416  BP: (!) 172/97 (!) 165/84 (!) 165/84 138/67  Pulse: (!) 103  92 99  Resp: 16   16  Temp: 98.6 F (37 C)   99 F (37.2 C)  TempSrc: Oral   Oral  SpO2: 100%   100%  Weight:      Height:       Physical Exam  Cardiovascular: Normal rate, regular rhythm and normal heart sounds.  Exam reveals no gallop and no friction rub.   No murmur heard. Pulmonary/Chest: Effort normal and breath sounds normal. No respiratory distress. She has no wheezes. She has no rales.  Abdominal: Soft. She exhibits no distension. There is no tenderness.  Musculoskeletal: She exhibits no edema.  Skin:  Noted below in pictures.         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  Cr 1.76 on admission with baseline 0.8. Today creatinine is 1.04 after IV NS. Encouraged oral hydration. - discharge today     Acute on chronic bilateral leg pain due to chronic wounds from vasculitis Patient came into the ED because her pain was not controlled at home with PO oxycodone. Her wounds appear stable with slight improvement in the right leg.  Patient was continued on doxycycline started from previous admission approximately 8 weeks ago. - Discharge with 5 days of pain medication for acute pain - Continue Doxycycline 100 mg BID (end date: through 09/12/16, will be 8 weeks total) - Counseled on stopping cocaine use  HTN BPs elevated 172/97.  Likely due to pain. Lisinopril was held yesterday. Restarted lisinopril today due to improvement in her renal function. -restart lisinopril  Dispo: Anticipated discharge today.   Valinda Party, DO 09/10/2016, 3:19 PM Pager: [phone]

## 2016-09-10 NOTE — Consult Note (Signed)
[place] Nurse wound consult note Reason for Consult: levamisole vasculitis chronic cocaine use.  Patient with long history of wounds related to drug use over her entire body.  She has had chronic leg wounds for several months.  She is well known to the [facility] team. Today the medical team has just assessed the RLE wound, I have looked at the pictures and discussed with the patient.  I did not take dressing down on the RLE per the patient's request since she just changed the dressing with the medical team.  Wound type: 3 open areas related to levamisole vasculitis Measurements:  Left medial: 3.5cm 4.5cm x 0.5cm  Left lateral great toe/webspace: 2cm x 1cm x 0.2cm   Wound bed: Left medial and left great toe are clean, pink, moist, painful Patient reports right posterior calf is clean with some loose "meat" centrally, I have seen this in the photos taken in the ED.  Do not feel the appearance of that tissue warrants enzymatic debridement ointment this time, patient agrees. Drainage (amount, consistency, odor) moderate, with odor from all wounds Periwound: intact with scarring over her body from previous wounds Dressing procedure/placement/frequency: Continue cleaning wounds with saline, cover with non adherent dressings, wrap with kerlix. Patient is independent with wound care and her wounds actually have not worsens since her last admission. Continue HHRN for support at home with wound care and supplies.  Discussed POC with patient and bedside nurse.  Re consult if needed, will not follow at this time. Thanks  Dayanira Giovannetti [employer], RN,CWOCN, CNS [phone])

## 2016-09-10 NOTE — Progress Notes (Addendum)
Update: Pt returned to ED and said she could not walk and needs a taxi voucher. CSW provided taxi voucher.  CSW received consult regarding transportation needs. Patient reported that she has no way home and no money with her. CSW provided bus pass.  Percell Locus Demarco Bacci LCSWA [phone]

## 2016-09-10 NOTE — Progress Notes (Signed)
Note copied from Dr.'s order  I Boyd Kerbs certify that this patient is under my care and that I, or a nurse practitioner or physician's assistant working with me, had a face-to-face encounter that meets the physician face-to-face encounter requirements with this patient on 09/10/2016. The encounter with the patient was in whole, or in part for the following medical condition(s) which is the primary reason for home health care (List medical condition): vascular disorder

## 2016-09-10 NOTE — Care Management Note (Addendum)
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 States lives @ a boarding house.  Ailene Ards (Brother)     [phone]       PCP:  Action/Plan: Return to home when medically stable. CM to f/u with disposition needs.  Expected Discharge Date:                Expected Discharge Plan:  [place]  In-House Referral:     Discharge planning Services  CM Consult  Post Acute Care Choice:  Resumption of Svcs/PTA Provider Choice offered to:  Patient  DME Arranged:   Wheelerchair DME Agency:   [facility]/ referral made with [facility] Arranged:  RN [facility] Agency:  [facility]/ Levada Dy @ [phone]  Status of Service:     If discussed at [place] of Stay Meetings, dates discussed:    Additional Comments:  Sharin Mons, RN 09/10/2016, 12:10 PM

## 2016-09-11 DIAGNOSIS — L958 Other vasculitis limited to the skin: Secondary | ICD-10-CM | POA: Diagnosis not present

## 2016-09-11 DIAGNOSIS — N179 Acute kidney failure, unspecified: Secondary | ICD-10-CM | POA: Diagnosis not present

## 2016-09-11 DIAGNOSIS — F14288 Cocaine dependence with other cocaine-induced disorder: Secondary | ICD-10-CM | POA: Diagnosis not present

## 2016-09-11 NOTE — Progress Notes (Signed)
Md notified ED care manager states unable to obtain wheelchair for patient to discharge at this time; will have to address in the morning. [facility] was also contacted prior to 5pm today, but no response. Patient states she is unable to walk to/from bus stop or from cab to destination due to leg pain/weakness.   Unable to discharge patient tonight. Md states will relay message to Dr. Heber Woodlynne in the morning.

## 2016-09-11 NOTE — Evaluation (Signed)
Physical Therapy Evaluation Patient Details Name: [name] MRN: [id number] DOB: 1961/01/19 Today's Date: 09/11/2016   History of Present Illness  Pt adm with AKI and acute on chronic BLE pain and wounds due to vasculitis. PMH - Polysubstance abuse, HTN, vasculitis  Clinical Impression  Pt presents with above and is limited in her mobility due to pain. Pt now with w/c and is able to demonstrate safe transfer into and out of w/c. Pt refused any attempts at amb and refused w/c propulsion. Pt verbalized she knew how to use w/c. Pt also verbalized she had been shown how all the parts of the w/c worked and that she understood this. Recommend return to home with wheelchair and her walker. Expect she will mobilize as needed with one of these at home.    Follow Up Recommendations No PT follow up    Equipment Recommendations  Wheelchair (measurements PT);Wheelchair cushion (measurements PT) (already received)    Recommendations for Other Services       Precautions / Restrictions Precautions Precautions: None Restrictions Weight Bearing Restrictions: No      Mobility  Bed Mobility Overal bed mobility: Independent                Transfers Overall transfer level: Modified independent Equipment used: None             General transfer comment: Pt performed stand pivot  bed to w/c to bed. Pt only putting minimal weight on rt lower extremity.  Ambulation/Gait             General Gait Details: Pt refused due to pain  Hotel manager mobility:  (Pt refused)  Modified Rankin (Stroke Patients Only)       Balance Overall balance assessment: No apparent balance deficits (not formally assessed)                                           Pertinent Vitals/Pain Pain Assessment: 0-10 Pain Score: 10-Worst pain ever Pain Location: bilateral lower extremities Pain Intervention(s): Limited  activity within patient's tolerance;Premedicated before session    Home Living Family/patient expects to be discharged to:: Private residence (boarding house)     Type of Home: House (boarding house) Home Access: Stairs to enter Entrance Stairs-Rails: Right Entrance Stairs-Number of Steps: 1 Home Layout: Two level;Bed/bath upstairs (Per nurse pt may be able to move to room on ground floor) Home Equipment: Cane - single point;Walker - 2 wheels      Prior Function Level of Independence: Independent with assistive device(s)         Comments: Reports she has been using walker. Either walks up the stairs or bumps up the stairs on her buttocks.     Hand Dominance   Dominant Hand: Right    Extremity/Trunk Assessment   Upper Extremity Assessment: Overall WFL for tasks assessed           Lower Extremity Assessment: RLE deficits/detail;LLE deficits/detail         Communication   Communication: No difficulties  Cognition Arousal/Alertness: Awake/alert Behavior During Therapy: WFL for tasks assessed/performed Overall Cognitive Status: Within Functional Limits for tasks assessed                      General Comments  Exercises     Assessment/Plan    PT Assessment Patent does not need any further PT services  PT Problem List            PT Treatment Interventions      PT Goals (Current goals can be found in the Care Plan section)  Acute Rehab PT Goals PT Goal Formulation: All assessment and education complete, DC therapy    Frequency     Barriers to discharge        Co-evaluation               End of Session   Activity Tolerance: Patient limited by pain Patient left: in bed;with call bell/phone within reach;with bed alarm set           Time: 1240-1250 PT Time Calculation (min) (ACUTE ONLY): 10 min   Charges:   PT Evaluation $PT Eval Low Complexity: 1 Procedure     PT G CodesShary Decamp [name] 09/12/16, 1:43  PM [employer] PT [phone]

## 2016-09-11 NOTE — Progress Notes (Signed)
CM spoke with [facility] @ [phone] regarding wheelchair. Larene Beach stated will f/u and wheelchair will be delivered to bedside. Whitman Hero CM,BSN,CM

## 2016-09-11 NOTE — Progress Notes (Signed)
   Subjective: Patient was evaluated this morning on rounds. She has no further complaints.  She states she couldn't walk yesterday to leave the hospital.  We offered a wheel chair but states she doesn't need it as she has a walker at home.  Objective:  Vital signs in last 24 hours: Vitals:   09/10/16 1201 09/10/16 1416 09/10/16 2250 09/11/16 0533  BP: (!) 165/84 138/67 (!) 165/84 (!) 162/77  Pulse: 92 99 93 88  Resp:  16 20 16   Temp:  99 F (37.2 C) 98.8 F (37.1 C) 98.8 F (37.1 C)  TempSrc:  Oral Oral Oral  SpO2:  100% 99% 99%  Weight:      Height:       Physical Exam  Constitutional: No distress.  Cardiovascular: Normal rate, regular rhythm and normal heart sounds.  Exam reveals no gallop and no friction rub.   No murmur heard. Pulmonary/Chest: Effort normal and breath sounds normal. No respiratory distress. She has no wheezes. She has no rales.  Abdominal: Soft. Bowel sounds are normal. She exhibits no distension. There is no tenderness.  Musculoskeletal: She exhibits no edema.  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      Acute on chronic bilateral leg pain due to chronic wounds from vasculitis Stable.  She has follow up with wound care on December 15th at 2:30 and December 14th with PCP at 1:15.  Patient was discharged yesterday but stated she could not walk to the bus stop.  A wheelchair was ordered and patient received but refused.  She stayed overnight despite giving wheelchair and bus/taxi pass. - Discharge with 5 days of pain medication for acute pain - Continue Doxycycline 100 mg BID (end date: through 09/12/16, will be 8 weeks total) - Wheelchair  HTN BPs elevated 162/77.  Likely due to pain. Lisinopril was restarted yesterday.  She has follow up with PCP next  week.  Dispo: Anticipated discharge today.   Valinda Party, DO 09/11/2016, 12:03 PM Pager: [phone]

## 2016-09-14 NOTE — Discharge Summary (Signed)
Name: [name] MRN: [id number] DOB: [birth year] 55 y.o. PCP: Zada Finders, MD  Date of Admission: 09/09/2016  7:05 AM Date of Discharge: 09/11/2016 Attending Physician: Aldine Contes  Discharge Diagnosis: 1. Acute Kidney Injury 2. 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   lisinopril 10 MG tablet Commonly known as:  PRINIVIL,ZESTRIL Take 2 tablets (20 mg total) by mouth daily.   naproxen sodium 220 MG tablet Commonly known as:  ANAPROX Take 440 mg by mouth daily as needed (pain).   oxyCODONE 5 MG immediate release tablet Commonly known as:  Oxy IR/ROXICODONE Take 1 tablet (5 mg total) by mouth every 8 (eight) hours as needed for moderate pain. What changed:  Another medication with the same name was removed. Continue taking this medication, and follow the directions you see here.       Disposition and follow-up:   [name] was discharged from [facility] in stable condition.  At the hospital follow up visit please address:  1.  Remind about follow up with wound care on 12/15, blood pressure check, antibiotic compliance (doxycycline for 8 weeks, end date of 12/9)  2.  Labs / imaging needed at time of follow-up: BMET, assessing creatinine as she was admitted for AKI  3.  Pending labs/ test needing follow-up: none  Follow-up Appointments: Follow-up Information    Medicaid Transportation Follow up.   Why:  Call 1 day in advance for transportation to and from appointments Contact information: [phone]       Zada Finders, MD Follow up on 09/17/2016.   Specialty:  Internal Medicine Why:  12/14 at 1:15pm Contact  information: [address] [place] [address] [phone]        [facility] Follow up on 09/18/2016.   Why:  12/15 at 2:30pm Contact information: [address] 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 Acute Kidney Injury Patient had a creatinine of 1.76 on admission with baseline of 0.8.  Patient reported decreased appetite over several days.  She was likely volume depleted leading to her AKI.  She received a 1 L NS bolus in the ED and maintenance fluid during admission.  Her creatinine improved to 1.04 prior to discharge.  She has close follow up with the Internal Medicine Clinic.  2. Cocaine-induced vascular disorder Patient follows with wound care but missed her last appointment.  Patient has had multiple hospital visits for her chronic lower extremity wounds.  She reports continued pain over the past couple of days with a new lesion on her left foot.  She has been on doxycycline since her last admission and was continued while in the hospital.  Her end date for the antibiotic is 12/9 for a total of 8 weeks.  Her wounds appear improved from 6 weeks ago.  Wound care was consulted and saw the patient during admission.  She was given oxycodone and a short course on discharge  for pain control.  On day of discharge patient stated she couldn't walk to the bus stop due to pain and required the use of a wheelchair.  This was provided, to help with mobilization.    3. Essential hypertension Blood pressures were elevated during admission in the 190s-200s.  It may have been due to pain. Lisinopril was held until her AKI resolved and then restarted. She has close follow up in the [facility].  Discharge Vitals:   BP (!) 162/77 (BP Location: Right Arm)   Pulse 88   Temp 98.8  F (37.1 C) (Oral)   Resp 16   Ht 5' 1"  (1.549 m)   Wt 94 lb 14.4 oz (43 kg)   SpO2 99%   BMI 17.93 kg/m   Pertinent Labs, Studies, and Procedures:  BMP Latest Ref Rng & Units 09/10/2016 09/09/2016 08/12/2016  Glucose 65 - 99 mg/dL 96 137(H) 83  BUN 6 - 20 mg/dL 22(H) 34(H) 17  Creatinine 0.44 - 1.00 mg/dL 1.04(H) 1.76(H) 0.88  BUN/Creat Ratio 9 - 23 - - -  Sodium 135 - 145 mmol/L 140 139 141  Potassium 3.5 - 5.1 mmol/L 4.3 4.2 3.5  Chloride 101 - 111 mmol/L 112(H) 107 109  CO2 22 - 32 mmol/L 18(L) 25 25  Calcium 8.9 - 10.3 mg/dL 8.8(L) 9.6 9.3    Discharge Instructions: Discharge Instructions    Diet - low sodium heart healthy    Complete by:  As directed    Increase activity slowly    Complete by:  As directed       Signed: Valinda Party, DO 09/14/2016, 9:07 PM   Pager: [phone]

## 2016-09-15 ENCOUNTER — Emergency Department (HOSPITAL_COMMUNITY)
Admission: EM | Admit: 2016-09-15 | Discharge: 2016-09-16 | Disposition: A | Discharge status: Home / Self Care | Payer: Medicaid Other | Attending: Emergency Medicine | Admitting: Emergency Medicine

## 2016-09-15 ENCOUNTER — Encounter (HOSPITAL_COMMUNITY): Payer: Self-pay | Admitting: Emergency Medicine

## 2016-09-15 DIAGNOSIS — I10 Essential (primary) hypertension: Secondary | ICD-10-CM | POA: Insufficient documentation

## 2016-09-15 DIAGNOSIS — L97313 Non-pressure chronic ulcer of right ankle with necrosis of muscle: Secondary | ICD-10-CM

## 2016-09-15 DIAGNOSIS — F17210 Nicotine dependence, cigarettes, uncomplicated: Secondary | ICD-10-CM | POA: Diagnosis not present

## 2016-09-15 DIAGNOSIS — Z79899 Other long term (current) drug therapy: Secondary | ICD-10-CM | POA: Insufficient documentation

## 2016-09-15 DIAGNOSIS — M79605 Pain in left leg: Secondary | ICD-10-CM | POA: Insufficient documentation

## 2016-09-15 DIAGNOSIS — M79604 Pain in right leg: Secondary | ICD-10-CM | POA: Diagnosis present

## 2016-09-15 MED ORDER — FENTANYL CITRATE (PF) 100 MCG/2ML IJ SOLN
50.0000 ug | Freq: Once | INTRAMUSCULAR | Status: AC
  Administered 2016-09-15: 50 ug via INTRAMUSCULAR
  Filled 2016-09-15: qty 2

## 2016-09-15 NOTE — ED Triage Notes (Signed)
Sores noted on both legs with drainage on dressings with foul odor

## 2016-09-15 NOTE — ED Triage Notes (Signed)
Pt comes from home via EMS with complaints of bilateral leg pain with open sores.  Pt states she had a "filter" put in her legs to increase circulation.  Noticed the sores about a week ago.  Hx of chronic pain.  Takes Vicodin at home. Ambulatory on scene. BP 158/90 HR 100 RR 16 in route.

## 2016-09-16 ENCOUNTER — Telehealth: Payer: Self-pay | Admitting: Internal Medicine

## 2016-09-16 NOTE — ED Notes (Signed)
Patient was alert, oriented and stable upon discharge. RN went over AVS and patient had no further questions.  

## 2016-09-16 NOTE — ED Provider Notes (Signed)
[facility] DEPT Provider Note   CSN: [id number] Arrival date & time: 09/15/16  2044     History   Chief Complaint Chief Complaint  Patient presents with  . Leg Pain  . Leg Sores    HPI [name] is a 55 y.o. female.  HPI 55 year old female with a history of cocaine abuse and chronic wounds due to vasculitis in her lower extremities presents with acute pain in her lower extremities. Pt reports that she has wound care help with her wounds and she also goes to the wound clinic for proper care. Pt started having increased pain today, and she noticed "nectrotic" tissue like she has had in the past. Pt reports that in the past the tissue has been debrided in the clinic, but her pain was so bad that she had to come to the ER. No n/v/f/c. Pt has pain meds and antibiotics at home.  Past Medical History:  Diagnosis Date  . CAP (community acquired pneumonia) 03/2014 X 2  . Cocaine abuse    ongoing with resultant vaculitis.  . Depression   . Headache    "weekly" (07/29/2016)  . Hypertension   . Inflammatory arthritis   . Migraines    "probably 5-6/yr" (07/29/2016)  . Normocytic anemia    BL Hgb 9.8-12. Last anemia panel 04/2010 - showing Fe 19, ferritin 101.  Pt on monthly B12 injections  . Rheumatoid arthritis(714.0)    patient reported  . 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 Followed by Dr. Louanne Skye    Patient Active Problem List   Diagnosis Date Noted  . Acute kidney injury ([place]) 09/09/2016  . Palliative care encounter   . Wound drainage   . Infectious myositis   . Wound dehiscence   . Finger necrosis ([place]) 05/18/2016  . AKI (acute kidney injury) ([place])   . Leg ulcer ([place])   . 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 ([place]) 06/04/2015  . Pressure ulcer 04/22/2015  . Skin necrosis ([place]) 04/21/2015  . Protein-calorie malnutrition, severe ([place]) 01/15/2015  . Severe recurrent major depression without psychotic  features ([place]) 10/16/2014  . Cocaine use disorder, severe, dependence ([place]) 10/10/2014  . Cannabis use disorder, severe, dependence ([place]) 10/10/2014  . Substance induced mood disorder ([place]) 10/09/2014  . 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 Hepatomegaly 04/11/2014  . Acute pericarditis 03/31/2014  . Inflammatory arthritis 09/24/2013  . 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 Tobacco abuse 01/03/2013  . hyperlipidemia 01/03/2013  . Essential hypertension 02/26/2010    Past Surgical History:  Procedure Laterality Date  . AMPUTATION Left 05/22/2016   Procedure: AMPUTATION LEFT LONG FINGER;  Surgeon: Marybelle Killings, MD;  Location: [facility];  Service: Orthopedics;  Laterality: Left;  . HERNIA REPAIR     "stomach"  . I&D EXTREMITY Right 09/26/2015   Procedure: IRRIGATION AND DEBRIDEMENT LEG WOUND  VAC PLACEMENT.;  Surgeon: Loel Lofty Dillingham, DO;  Location: [facility];  Service: Plastics;  Laterality: Right;  . IRRIGATION AND DEBRIDEMENT ABSCESS Bilateral 09/26/2013   Procedure: DEBRIDEMENT ULCERS BILATERAL THIGHS;  Surgeon: Gwenyth Ober, MD;  Location: [facility];  Service: General;  Laterality: Bilateral;  . SKIN BIOPSY Bilateral    shin nodules    OB History    No data available       Home Medications    Prior to Admission medications   Medication Sig Start Date End Date Taking? Authorizing Provider  lisinopril (PRINIVIL,ZESTRIL) 10  MG tablet Take 2 tablets (20 mg total) by mouth daily. 07/29/16  Yes Lorella Nimrod, MD  naproxen sodium (ANAPROX) 220 MG tablet Take 440 mg by mouth daily as needed (pain).   Yes Historical Provider, MD  doxycycline (VIBRAMYCIN) 100 MG capsule Take 1 capsule (100 mg total) by mouth 2 (two) times daily. Patient not taking: Reported on 09/15/2016 09/02/16   Deno Etienne, DO  oxyCODONE (OXY IR/ROXICODONE) 5 MG immediate release tablet Take 1 tablet (5 mg total) by mouth every 8 (eight) hours as needed for moderate pain. Patient not taking: Reported on  09/15/2016 09/10/16   Juliet Rude, MD    Family History Family History  Problem Relation Age of Onset  . Breast cancer Mother     Breast cancer  . Alcohol abuse Mother   . Colon cancer Maternal Aunt 66  . Alcohol abuse Father     Social History Social History  Substance Use Topics  . Smoking status: Current Every Day Smoker    Packs/day: 0.12    Years: 38.00    Types: Cigarettes  . Smokeless tobacco: Never Used  . Alcohol use No     Allergies   Acetaminophen   Review of Systems Review of Systems  ROS 10 Systems reviewed and are negative for acute change except as noted in the HPI.     Physical Exam Updated Vital Signs BP 186/83 (BP Location: Right Arm)   Pulse 90   Temp 98.2 F (36.8 C) (Oral)   Resp 18   Ht 5' 1"  (1.549 m)   Wt 98 lb (44.5 kg)   SpO2 100%   BMI 18.52 kg/m   Physical Exam  Constitutional: She is oriented to person, place, and time. She appears well-developed.  HENT:  Head: Normocephalic and atraumatic.  Eyes: EOM are normal.  Neck: Normal range of motion. Neck supple.  Cardiovascular: Normal rate.   Pulmonary/Chest: Effort normal.  Abdominal: Bowel sounds are normal.  Neurological: She is alert and oriented to person, place, and time.  Skin: Skin is warm and dry.  Large ulceration to the R posterior calf. Please see the inserted picture below  Nursing note and vitals reviewed.        ED Treatments / Results  Labs (all labs ordered are listed, but only abnormal results are displayed) Labs Reviewed - No data to display  EKG  EKG Interpretation None       Radiology No results found.  Procedures Procedures (including critical care time)  Medications Ordered in ED Medications  fentaNYL (SUBLIMAZE) injection 50 mcg (50 mcg Intramuscular Given 09/15/16 2329)     Initial Impression / Assessment and Plan / ED Course  I have reviewed the triage vital signs and the nursing notes.  Pertinent labs & imaging results  that were available during my care of the patient were reviewed by me and considered in my medical decision making (see chart for details).  Clinical Course as of Sep 17 103  Wed Sep 16, 2016  0103 Pt'a pain has resolved. She wants food and sprite, which I provided. Pt is comfortable going to the clinic for debridement tomorrow. Strict ER return precautions have been discussed, and patient is agreeing with the plan and is comfortable with the workup done and the recommendations from the ER.   [AN]    Clinical Course User Index [AN] Varney Biles, MD   Pt has pain at her chronic ulcer site. She has necrotic lesions, that will need debridement. No  signs of infection. Pt's wound appeared fine 2 days ago according to her. Pt has had wound debridement in the clinic, and feels comfortable going there, but her pain was unbearable, thus she called EMS.  - Pain control - If pain controlled- d/c with f/u at the wound clinic for debridement.  - If pain not controlled, admit to medicine and pt can get inpatient debridement.  Pt is in agreement with the plan.    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

## 2016-09-16 NOTE — Telephone Encounter (Signed)
APT. REMINDER CALL, NO ANSWER, NO VOICEMAIL °

## 2016-09-16 NOTE — Discharge Instructions (Signed)
[name], you will need the wound debrided. Please go to the wound care clinic tomorrow to get the wound debrided.  Return to the Er if the pain is severe, unbearable.

## 2016-09-17 ENCOUNTER — Ambulatory Visit: Payer: Self-pay

## 2016-09-20 ENCOUNTER — Emergency Department (HOSPITAL_COMMUNITY)
Admission: EM | Admit: 2016-09-20 | Discharge: 2016-09-22 | Disposition: A | Discharge status: Home / Self Care | Payer: Medicaid Other | Attending: Emergency Medicine | Admitting: Emergency Medicine

## 2016-09-20 ENCOUNTER — Encounter (HOSPITAL_COMMUNITY): Payer: Self-pay | Admitting: Emergency Medicine

## 2016-09-20 DIAGNOSIS — Z9889 Other specified postprocedural states: Secondary | ICD-10-CM | POA: Diagnosis not present

## 2016-09-20 DIAGNOSIS — Z803 Family history of malignant neoplasm of breast: Secondary | ICD-10-CM | POA: Diagnosis not present

## 2016-09-20 DIAGNOSIS — L97912 Non-pressure chronic ulcer of unspecified part of right lower leg with fat layer exposed: Secondary | ICD-10-CM

## 2016-09-20 DIAGNOSIS — F17210 Nicotine dependence, cigarettes, uncomplicated: Secondary | ICD-10-CM | POA: Insufficient documentation

## 2016-09-20 DIAGNOSIS — L97212 Non-pressure chronic ulcer of right calf with fat layer exposed: Secondary | ICD-10-CM | POA: Diagnosis not present

## 2016-09-20 DIAGNOSIS — F332 Major depressive disorder, recurrent severe without psychotic features: Secondary | ICD-10-CM | POA: Diagnosis not present

## 2016-09-20 DIAGNOSIS — Z811 Family history of alcohol abuse and dependence: Secondary | ICD-10-CM | POA: Diagnosis not present

## 2016-09-20 DIAGNOSIS — L97222 Non-pressure chronic ulcer of left calf with fat layer exposed: Secondary | ICD-10-CM | POA: Insufficient documentation

## 2016-09-20 DIAGNOSIS — I10 Essential (primary) hypertension: Secondary | ICD-10-CM | POA: Insufficient documentation

## 2016-09-20 DIAGNOSIS — L97229 Non-pressure chronic ulcer of left calf with unspecified severity: Secondary | ICD-10-CM | POA: Diagnosis present

## 2016-09-20 DIAGNOSIS — L97922 Non-pressure chronic ulcer of unspecified part of left lower leg with fat layer exposed: Secondary | ICD-10-CM

## 2016-09-20 MED ORDER — OXYCODONE HCL 5 MG PO TABS: 5.0000 mg | ORAL_TABLET | Freq: Three times a day (TID) | ORAL | 0 refills | Status: DC | PRN

## 2016-09-20 MED ORDER — OXYCODONE HCL 5 MG PO TABS
10.0000 mg | ORAL_TABLET | Freq: Once | ORAL | Status: AC
  Administered 2016-09-20: 10 mg via ORAL
  Filled 2016-09-20: qty 2

## 2016-09-20 MED ORDER — LISINOPRIL 10 MG PO TABS: 20.0000 mg | ORAL_TABLET | Freq: Every day | ORAL | 5 refills | Status: DC

## 2016-09-20 NOTE — ED Notes (Signed)
Wounds to bilat lower est and between toes on L foot wrapped. Telfa placed on wounds with K[place] and ace wrap.  Pt states she usually gets home by bus but will have to stay in lobby until morning, pt loudly sobbing in pain.  NP and charge nurse made aware of patients needs.

## 2016-09-20 NOTE — Discharge Instructions (Signed)
Your prescription for blood pressure medicine and pain medication, have been refilled your wounds look improved from the last visit, please continue your wound care regime.  Make an appointment with the wound care clinic for further evaluation

## 2016-09-20 NOTE — ED Triage Notes (Signed)
Per EMS: pt from home c/o leg pain is chronic in nature with chronic wound; pt sts URI sx

## 2016-09-20 NOTE — ED Provider Notes (Addendum)
[facility] DEPT Provider Note   CSN: [id number] Arrival date & time: 09/20/16  1842     History   Chief Complaint Chief Complaint  Patient presents with  . Leg Pain    HPI [name] is a 55 y.o. female.  This a 55 year old female with chronic ulcers of her lower legs, frequency emergency department for pain control.  She supposed to follow-up with wound care clinic, but has not been in 3 weeks.  She states she ran out of her oxycodone as well as her blood pressure medicine this morning is experienced increased pain.  She's been doing her own wound care.  She states she does not like to go see Dr. Posey Pronto because it hurts when he debridements and treats her wounds      Past Medical History:  Diagnosis Date  . CAP (community acquired pneumonia) 03/2014 X 2  . Cocaine abuse    ongoing with resultant vaculitis.  . Depression   . Headache    "weekly" (07/29/2016)  . Hypertension   . Inflammatory arthritis   . Migraines    "probably 5-6/yr" (07/29/2016)  . Normocytic anemia    BL Hgb 9.8-12. Last anemia panel 04/2010 - showing Fe 19, ferritin 101.  Pt on monthly B12 injections  . Rheumatoid arthritis(714.0)    patient reported  . 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 Followed by Dr. Louanne Skye    Patient Active Problem List   Diagnosis Date Noted  . Acute kidney injury ([place]) 09/09/2016  . Palliative care encounter   . Wound drainage   . Infectious myositis   . Wound dehiscence   . Finger necrosis ([place]) 05/18/2016  . AKI (acute kidney injury) ([place])   . Leg ulcer ([place])   . 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 ([place]) 06/04/2015  . Pressure ulcer 04/22/2015  . Skin necrosis ([place]) 04/21/2015  . Protein-calorie malnutrition, severe ([place]) 01/15/2015  . Severe recurrent major depression without psychotic features ([place]) 10/16/2014  . Cocaine use disorder, severe, dependence ([place]) 10/10/2014  . Cannabis use  disorder, severe, dependence ([place]) 10/10/2014  . Substance induced mood disorder ([place]) 10/09/2014  . 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 Hepatomegaly 04/11/2014  . Acute pericarditis 03/31/2014  . Inflammatory arthritis 09/24/2013  . 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 Tobacco abuse 01/03/2013  . hyperlipidemia 01/03/2013  . Essential hypertension 02/26/2010    Past Surgical History:  Procedure Laterality Date  . AMPUTATION Left 05/22/2016   Procedure: AMPUTATION LEFT LONG FINGER;  Surgeon: Marybelle Killings, MD;  Location: [facility];  Service: Orthopedics;  Laterality: Left;  . HERNIA REPAIR     "stomach"  . I&D EXTREMITY Right 09/26/2015   Procedure: IRRIGATION AND DEBRIDEMENT LEG WOUND  VAC PLACEMENT.;  Surgeon: Loel Lofty Dillingham, DO;  Location: [facility];  Service: Plastics;  Laterality: Right;  . IRRIGATION AND DEBRIDEMENT ABSCESS Bilateral 09/26/2013   Procedure: DEBRIDEMENT ULCERS BILATERAL THIGHS;  Surgeon: Gwenyth Ober, MD;  Location: [facility];  Service: General;  Laterality: Bilateral;  . SKIN BIOPSY Bilateral    shin nodules    OB History    No data available       Home Medications    Prior to Admission medications   Medication Sig Start Date End Date Taking? Authorizing Provider  doxycycline (VIBRAMYCIN) 100 MG capsule Take 1 capsule (100 mg total) by mouth 2 (two) times daily. Patient not taking: Reported on 09/15/2016  09/02/16   Deno Etienne, DO  lisinopril (PRINIVIL,ZESTRIL) 10 MG tablet Take 2 tablets (20 mg total) by mouth daily. 09/20/16   Junius Creamer, NP  naproxen sodium (ANAPROX) 220 MG tablet Take 440 mg by mouth daily as needed (pain).    Historical Provider, MD  oxyCODONE (OXY IR/ROXICODONE) 5 MG immediate release tablet Take 1 tablet (5 mg total) by mouth every 8 (eight) hours as needed for moderate pain. 09/20/16   Junius Creamer, NP    Family History Family History  Problem Relation Age of Onset  . Breast cancer Mother     Breast cancer  .  Alcohol abuse Mother   . Colon cancer Maternal Aunt 14  . Alcohol abuse Father     Social History Social History  Substance Use Topics  . Smoking status: Current Every Day Smoker    Packs/day: 0.12    Years: 38.00    Types: Cigarettes  . Smokeless tobacco: Never Used  . Alcohol use No     Allergies   Acetaminophen   Review of Systems Review of Systems  Constitutional: Negative for fever.  Respiratory: Negative for shortness of breath.   Cardiovascular: Negative for chest pain and leg swelling.  Skin: Positive for wound.  All other systems reviewed and are negative.    Physical Exam Updated Vital Signs BP 167/91 (BP Location: Right Arm)   Pulse 105   Temp 98.7 F (37.1 C)   Resp 18   SpO2 100%   Physical Exam  Constitutional: She appears well-developed and well-nourished. She appears distressed.  HENT:  Head: Normocephalic.  Eyes: Pupils are equal, round, and reactive to light.  Neck: Normal range of motion.  Cardiovascular: Normal rate.   Pulmonary/Chest: Effort normal.  Musculoskeletal: She exhibits tenderness.       Legs: Neurological: She is alert.  Skin: Skin is warm.  Psychiatric: She has a normal mood and affect.  Nursing note and vitals reviewed.    ED Treatments / Results  Labs (all labs ordered are listed, but only abnormal results are displayed) Labs Reviewed - No data to display  EKG  EKG Interpretation None       Radiology No results found.  Procedures Procedures (including critical care time)  Medications Ordered in ED Medications  oxyCODONE (Oxy IR/ROXICODONE) immediate release tablet 10 mg (10 mg Oral Given 09/20/16 2052)     Initial Impression / Assessment and Plan / ED Course  I have reviewed the triage vital signs and the nursing notes.  Pertinent labs & imaging results that were available during my care of the patient were reviewed by me and considered in my medical decision making (see chart for  details).  Clinical Course    Patient is crying in pain, but asking for exam much as well. Once examined.  There is significant improvement from documentation dated pictures on December 12.  I feel she is doing a good job with dressing change.  She's been encouraged to follow-up with the wound care center.  Prescriptions for pain control and her antihypertensives have been issued. Time of discharge because patient is not being admitted to the hospital for pain control.  She now states that she is suicidal.  I will have TTS evaluation Patient was hysterical during the TTS evaluation, screaming and yelling that she was in a kill himself, that she can't take it anymore.  The counselor felt that she needed to see the psychiatrist in the morning.  She will be held over for  psychiatric evaluation in the a.m.  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

## 2016-09-21 DIAGNOSIS — Z79899 Other long term (current) drug therapy: Secondary | ICD-10-CM

## 2016-09-21 DIAGNOSIS — Z800 Family history of malignant neoplasm of digestive organs: Secondary | ICD-10-CM

## 2016-09-21 DIAGNOSIS — Z803 Family history of malignant neoplasm of breast: Secondary | ICD-10-CM | POA: Diagnosis not present

## 2016-09-21 DIAGNOSIS — Z9889 Other specified postprocedural states: Secondary | ICD-10-CM

## 2016-09-21 DIAGNOSIS — Z79891 Long term (current) use of opiate analgesic: Secondary | ICD-10-CM

## 2016-09-21 DIAGNOSIS — Z888 Allergy status to other drugs, medicaments and biological substances status: Secondary | ICD-10-CM

## 2016-09-21 DIAGNOSIS — F332 Major depressive disorder, recurrent severe without psychotic features: Secondary | ICD-10-CM

## 2016-09-21 DIAGNOSIS — Z811 Family history of alcohol abuse and dependence: Secondary | ICD-10-CM

## 2016-09-21 DIAGNOSIS — F17210 Nicotine dependence, cigarettes, uncomplicated: Secondary | ICD-10-CM

## 2016-09-21 DIAGNOSIS — R45851 Suicidal ideations: Secondary | ICD-10-CM

## 2016-09-21 LAB — CBC WITH DIFFERENTIAL/PLATELET
Basophils Absolute: 0 10*3/uL (ref 0.0–0.1)
Basophils Relative: 0 %
EOS ABS: 0.1 10*3/uL (ref 0.0–0.7)
Eosinophils Relative: 1 %
HCT: 32.7 % — ABNORMAL LOW (ref 36.0–46.0)
HEMOGLOBIN: 10.7 g/dL — AB (ref 12.0–15.0)
LYMPHS ABS: 2.3 10*3/uL (ref 0.7–4.0)
LYMPHS PCT: 21 %
MCH: 27.9 pg (ref 26.0–34.0)
MCHC: 32.7 g/dL (ref 30.0–36.0)
MCV: 85.2 fL (ref 78.0–100.0)
Monocytes Absolute: 0.7 10*3/uL (ref 0.1–1.0)
Monocytes Relative: 7 %
NEUTROS PCT: 71 %
Neutro Abs: 7.8 10*3/uL — ABNORMAL HIGH (ref 1.7–7.7)
Platelets: 357 10*3/uL (ref 150–400)
RBC: 3.84 MIL/uL — AB (ref 3.87–5.11)
RDW: 14.1 % (ref 11.5–15.5)
WBC: 10.8 10*3/uL — AB (ref 4.0–10.5)

## 2016-09-21 LAB — I-STAT CHEM 8, ED
BUN: 21 mg/dL — AB (ref 6–20)
CALCIUM ION: 1.21 mmol/L (ref 1.15–1.40)
CHLORIDE: 106 mmol/L (ref 101–111)
Creatinine, Ser: 1.1 mg/dL — ABNORMAL HIGH (ref 0.44–1.00)
Glucose, Bld: 81 mg/dL (ref 65–99)
HCT: 29 % — ABNORMAL LOW (ref 36.0–46.0)
Hemoglobin: 9.9 g/dL — ABNORMAL LOW (ref 12.0–15.0)
POTASSIUM: 3.8 mmol/L (ref 3.5–5.1)
SODIUM: 143 mmol/L (ref 135–145)
TCO2: 28 mmol/L (ref 0–100)

## 2016-09-21 MED ORDER — GABAPENTIN 100 MG PO CAPS
100.0000 mg | ORAL_CAPSULE | Freq: Three times a day (TID) | ORAL | Status: DC
  Administered 2016-09-21 – 2016-09-22 (×4): 100 mg via ORAL
  Filled 2016-09-21 (×4): qty 1

## 2016-09-21 MED ORDER — ONDANSETRON HCL 4 MG PO TABS: 4.0000 mg | ORAL_TABLET | Freq: Three times a day (TID) | ORAL | Status: DC | PRN

## 2016-09-21 MED ORDER — OXYCODONE HCL 5 MG PO TABS
5.0000 mg | ORAL_TABLET | Freq: Three times a day (TID) | ORAL | Status: DC | PRN
  Administered 2016-09-21 – 2016-09-22 (×3): 5 mg via ORAL
  Filled 2016-09-21 (×3): qty 1

## 2016-09-21 MED ORDER — DULOXETINE HCL 20 MG PO CPEP
20.0000 mg | ORAL_CAPSULE | Freq: Every day | ORAL | Status: DC
  Administered 2016-09-21 – 2016-09-22 (×2): 20 mg via ORAL
  Filled 2016-09-21 (×2): qty 1

## 2016-09-21 MED ORDER — IBUPROFEN 200 MG PO TABS: 600.0000 mg | ORAL_TABLET | Freq: Three times a day (TID) | ORAL | Status: DC | PRN

## 2016-09-21 MED ORDER — LORAZEPAM 1 MG PO TABS
1.0000 mg | ORAL_TABLET | Freq: Three times a day (TID) | ORAL | Status: DC | PRN
  Administered 2016-09-21 – 2016-09-22 (×3): 1 mg via ORAL
  Filled 2016-09-21 (×3): qty 1

## 2016-09-21 MED ORDER — NICOTINE 21 MG/24HR TD PT24
21.0000 mg | MEDICATED_PATCH | Freq: Every day | TRANSDERMAL | Status: DC
  Filled 2016-09-21: qty 1

## 2016-09-21 MED ORDER — NAPROXEN SODIUM 275 MG PO TABS
440.0000 mg | ORAL_TABLET | Freq: Every day | ORAL | Status: DC | PRN
Start: 2016-09-21 — End: 2016-09-21

## 2016-09-21 MED ORDER — LISINOPRIL 20 MG PO TABS
20.0000 mg | ORAL_TABLET | Freq: Every day | ORAL | Status: DC
  Administered 2016-09-21 – 2016-09-22 (×2): 20 mg via ORAL
  Filled 2016-09-21 (×2): qty 1

## 2016-09-21 MED ORDER — NAPROXEN 250 MG PO TABS
500.0000 mg | ORAL_TABLET | Freq: Every day | ORAL | Status: DC | PRN
  Administered 2016-09-21 – 2016-09-22 (×3): 500 mg via ORAL
  Filled 2016-09-21 (×3): qty 2

## 2016-09-21 NOTE — Discharge Planning (Addendum)
Pt up for discharge. [facility] reviewed chart for possible CM needs. Pt currently active with [facility].  Resumption of care requested.  No DME needs identified at this time.  No needs identified or communicated.

## 2016-09-21 NOTE — BH Assessment (Addendum)
Tele Assessment Note   [name] is an 55 y.o. female, who presents voluntarily and unac[facility] to [facility]. Pt reports, she initially came to [facility] because today she has extreme leg pain. Pt reported, yesterday was not bad but today she can not walk. Pt reported, she came to [facility] and when she was about to be discharged, she said she "was going to kill herself." Thoughtout the assessment, pt reported, "I can't do this anymore; it hurts so bad; when he gets ready for me, I'm ready." Pt cried, grabbed her legs and moved around in the bed during the assessment. Pt reported, she hears her mothers' voice. Pt reported, she was separated from her family 14 years ago and she doesn't know if they are dead or alive.  Pt reported, she  Has not seen her wound doctor (Dr. Posey Pronto) in about a month because he it hurts when he cleans her wounds. Pt reports, the doctor does numb her. Pt denied HI and self-injurious behaviors.   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  She supposed to follow-up with wound care clinic, but has not been in 3 weeks.  She states she ran out of her oxycodone as well as her blood pressure medicine this morning is experienced increased pain.  She's been doing her own wound care.  She states she does not like to go see Dr. Posey Pronto because it hurts when he debridements and treats her wounds."  Per Anderson Malta, RN note: "NT Informed RN that pt stated that "I'm going to kill myself" over and over as she was being discharged.  RN informed PA and charge RN.  Pt is not going to be discharged, will be TTS."   Pt denied experience verbal, physical and sexual abuse. Pt reported, smoking four cigarettes. Pt has a history of crack cocaine usage. Pt reported, she is not linked to OPT resources. Pt reported, previous inpatient admission to [facility] for SI. Pt could not recall specific date.    Pt presented crying in street  clothes with crying/sobbing speech. Pt eye contact was poor. Pt's mood was depressed/sad. Pt's affect was appropriate to circumstance. Pt's thought process was coherent/relevant. Pt was not oriented. Pt's concentrating was poor. Pt's judgement was partial. Pt's insight and impulse control was poor. Pt reported, if discharged from [facility] she could not contract for safety. Pt reported, if inpatient treatment was recommended she would sign in voluntarily.   Diagnosis: Major Depressive Disorder, Single Episode.   Past Medical History:  Past Medical History:  Diagnosis Date  . CAP (community acquired pneumonia) 03/2014 X 2  . Cocaine abuse    ongoing with resultant vaculitis.  . Depression   . Headache    "weekly" (07/29/2016)  . Hypertension   . Inflammatory arthritis   . Migraines    "probably 5-6/yr" (07/29/2016)  . Normocytic anemia    BL Hgb 9.8-12. Last anemia panel 04/2010 - showing Fe 19, ferritin 101.  Pt on monthly B12 injections  . Rheumatoid arthritis(714.0)    patient reported  . 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 Followed by Dr. Louanne Skye    Past Surgical History:  Procedure Laterality Date  . AMPUTATION Left 05/22/2016   Procedure: AMPUTATION LEFT LONG FINGER;  Surgeon: Marybelle Killings, MD;  Location: [facility];  Service: Orthopedics;  Laterality: Left;  . HERNIA REPAIR     "  stomach"  . I&D EXTREMITY Right 09/26/2015   Procedure: IRRIGATION AND DEBRIDEMENT LEG WOUND  VAC PLACEMENT.;  Surgeon: Loel Lofty Dillingham, DO;  Location: [facility];  Service: Plastics;  Laterality: Right;  . IRRIGATION AND DEBRIDEMENT ABSCESS Bilateral 09/26/2013   Procedure: DEBRIDEMENT ULCERS BILATERAL THIGHS;  Surgeon: Gwenyth Ober, MD;  Location: [facility];  Service: General;  Laterality: Bilateral;  . SKIN BIOPSY Bilateral    shin nodules    Family History:  Family History  Problem Relation Age of Onset  . Breast cancer Mother     Breast cancer  .  Alcohol abuse Mother   . Colon cancer Maternal Aunt 55  . Alcohol abuse Father     Social History:  reports that she has been smoking Cigarettes.  She has a 4.56 pack-year smoking history. She has never used smokeless tobacco. She reports that she uses drugs, including "Crack" cocaine and Cocaine. She reports that she does not drink alcohol.  Additional Social History:  Alcohol / Drug Use Pain Medications: See MAR Prescriptions: See MAR Over the Counter: See MAR History of alcohol / drug use?: Yes Substance #1 Name of Substance 1: Marijuana 1 - Age of First Use: UTA 1 - Amount (size/oz): Pt reported, it's been a while since she smoked marijuana.  1 - Frequency: UTA 1 - Duration: UTA 1 - Last Use / Amount: Pt reported, its been a while since she smoke marijuana.  Substance #2 Name of Substance 2: Cigarettes 2 - Age of First Use: UTA 2 - Amount (size/oz): Pt reported, smoking four cigarettes daily.  2 - Frequency: UTA 2 - Duration: UTA 2 - Last Use / Amount: Pt reported, smoking four cigarettes daily.  Substance #3 Name of Substance 3: Cocaine  3 - Last Use / Amount: By history  CIWA: CIWA-Ar BP: 193/85 Pulse Rate: 103 COWS:    PATIENT STRENGTHS: (choose at least two) Average or above average intelligence General fund of knowledge  Allergies:  Allergies  Allergen Reactions  . Acetaminophen Swelling and Other (See Comments)    Reaction:  Eyelid swelling    Home Medications:  (Not in a hospital admission)  OB/GYN Status:  No LMP recorded. Patient is postmenopausal.  General Assessment Data Location of Assessment: [facility] ED TTS Assessment: In system Is this a Tele or Face-to-Face Assessment?: Tele Assessment Is this an Initial Assessment or a Re-assessment for this encounter?: Initial Assessment Marital status: Single Maiden name: NA Is patient pregnant?: No Pregnancy Status: No Living Arrangements:  (Pt reports with friends. ) Can pt return to current living  arrangement?: Yes Admission Status: Voluntary Is patient capable of signing voluntary admission?: Yes Referral Source: Self/Family/Friend Insurance type: Medicaid     Crisis Care Plan Living Arrangements:  (Pt reports with friends. ) Legal Guardian: Other: (Self) Name of Psychiatrist: NA Name of Therapist: NA  Education Status Is patient currently in school?: No Current Grade: NA Highest grade of school patient has completed: [employer] Name of school: NA Contact person: NA  Risk to self with the past 6 months Suicidal Ideation: Yes-Currently Present Has patient been a risk to self within the past 6 months prior to admission? : No Suicidal Intent: No Has patient had any suicidal intent within the past 6 months prior to admission? : No Is patient at risk for suicide?: Yes Suicidal Plan?: No Has patient had any suicidal plan within the past 6 months prior to admission? : No Access to Means: No What has been your use  of drugs/alcohol within the last 12 months?: Marijuana, cigarettes Previous Attempts/Gestures: Yes How many times?: 1 Other Self Harm Risks: NA Triggers for Past Attempts: Other (Comment) (Pt reported because she was in pain.) Intentional Self Injurious Behavior: None (Pt denies. ) Family Suicide History: Unknown Recent stressful life event(s):  (Pt reported not seeing her family in 76 years and her pain.) Persecutory voices/beliefs?: No Depression: Yes Depression Symptoms: Isolating, Feeling worthless/self pity Substance abuse history and/or treatment for substance abuse?: Yes Suicide prevention information given to non-admitted patients: Not applicable  Risk to Others within the past 6 months Homicidal Ideation: No (Pt denies. ) Does patient have any lifetime risk of violence toward others beyond the six months prior to admission? : No Thoughts of Harm to Others: No Current Homicidal Intent: No Current Homicidal Plan: No Access to Homicidal Means: No Identified  Victim: NA History of harm to others?: No Assessment of Violence: None Noted Violent Behavior Description: NA Does patient have access to weapons?: Yes (Comment) (Pt reports, knives.) Criminal Charges Pending?: No Does patient have a court date: No Is patient on probation?: No  Psychosis Hallucinations: Auditory Delusions: None noted (Pt denies.)  Mental Status Report Appearance/Hygiene: In scrubs Eye Contact: Poor Motor Activity: Freedom of movement Speech: Other (Comment) (crying) Level of Consciousness: Crying Mood: Depressed, Sad Affect: Appropriate to circumstance Anxiety Level: Minimal Thought Processes: Coherent, Relevant Judgement: Partial Orientation: Not oriented Obsessive Compulsive Thoughts/Behaviors: Unable to Assess  Cognitive Functioning Concentration: Poor Memory: Recent Intact IQ: Average Insight: Poor Impulse Control: Poor Appetite:  (UTA) Weight Loss: 0 Weight Gain: 0 Sleep: Unable to Assess Vegetative Symptoms: None  ADLScreening [facility] Assessment Services) Patient's cognitive ability adequate to safely complete daily activities?: Yes Patient able to express need for assistance with ADLs?: Yes Independently performs ADLs?: No  Prior Inpatient Therapy Prior Inpatient Therapy: Yes Prior Therapy Dates: Pt reported, a long time ago.  Prior Therapy Facilty/Provider(s): [facility] Reason for Treatment: SI  Prior Outpatient Therapy Prior Outpatient Therapy: No Prior Therapy Dates: NA Prior Therapy Facilty/Provider(s): NA Reason for Treatment: NA Does patient have an ACCT team?: Unknown Does patient have Intensive In-House Services?  : No Does patient have [facility] services? : Unknown Does patient have P4CC services?: Unknown  ADL Screening (condition at time of admission) Patient's cognitive ability adequate to safely complete daily activities?: Yes Is the patient deaf or have difficulty hearing?: Yes Does the patient have difficulty seeing, even  when wearing glasses/contacts?: No (Pt denies. ) Does the patient have difficulty concentrating, remembering, or making decisions?: Yes Patient able to express need for assistance with ADLs?: Yes Does the patient have difficulty dressing or bathing?: Yes Independently performs ADLs?: No Communication: Independent Dressing (OT): Independent Grooming: Independent Feeding: Independent Bathing: Needs assistance Is this a change from baseline?: Pre-admission baseline Toileting: Independent In/Out Bed: Independent Walks in Home: Needs assistance Is this a change from baseline?: Pre-admission baseline Does the patient have difficulty walking or climbing stairs?: Yes Weakness of Legs: Both Weakness of Arms/Hands:  (Pt missing finger. )       Abuse/Neglect Assessment (Assessment to be complete while patient is alone) Physical Abuse: Denies (Pt denies. ) Verbal Abuse: Denies (Pt denies. ) Sexual Abuse: Denies (Pt denies. )     Advance Directives (For Healthcare) Does Patient Have a Medical Advance Directive?: No    Additional Information 1:1 In Past 12 Months?: No CIRT Risk: No Elopement Risk: No Does patient have medical clearance?: No     Disposition: Lindon Romp,  NP recommends AM Psychiatric Evaluation. Disposition discussed with Baker Janus, NP and Janett Billow, RN.   Disposition Initial Assessment Completed for this Encounter: Yes Disposition of Patient: Other dispositions (AM Psychiatric Evaluation) Other disposition(s): Other (Comment) (AM Psychiatric Evaluation)  Edd Fabian 09/21/2016 1:17 AM   Edd Fabian, MS, [facility], [facility] Triage Specialist [phone]

## 2016-09-21 NOTE — ED Notes (Signed)
NT Informed RN that pt stated that "I'm going to kill myself" over and over as she was being discharged.  RN informed PA and charge RN.  Pt is not going to be discharged, will be TTS.

## 2016-09-21 NOTE — ED Notes (Addendum)
Pt placed in maroon scrubs, security called to wand pt, belongings placed in bags with pt label, notified staffing and charge nurse

## 2016-09-21 NOTE — ED Notes (Signed)
While helping patient get in wheelchair for discharge, she continued to say, "I can't take this anymore, I am going to kill myself." I informed Anderson Malta, RN and [place], [place]

## 2016-09-21 NOTE — ED Notes (Signed)
valuables inventoried and given to security

## 2016-09-21 NOTE — ED Notes (Addendum)
Pt sleeping when RN not in room. However, when RN entered room to assess patient's pain she awakens and begins to cry out, moan, and roll back and forth in bed. Patient tearful and states she can not take this pain anymore. Requesting more medication for pain.

## 2016-09-21 NOTE — Consult Note (Signed)
Telepsych Consultation   Reason for Consult:  Depression with SI Referring Physician: EDP Patient Identification: [name] MRN:  [id number] Principal Diagnosis: MDD (major depressive disorder), recurrent episode, severe ([place]) Diagnosis:   Patient Active Problem List   Diagnosis Date Noted  . Acute kidney injury ([place]) [N17.9] 09/09/2016  . Palliative care encounter [Z51.5]   . Wound drainage [T14.8XXA]   . Infectious myositis [M60.009]   . Wound dehiscence [T81.30XA]   . Finger necrosis ([place]) [I96] 05/18/2016  . AKI (acute kidney injury) ([place]) [N17.9]   . Leg ulcer ([address]) [M19.622]   . 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 ([place]) [L97.203] 06/04/2015  . Pressure ulcer [L89.90] 04/22/2015  . Skin necrosis ([place]) [I96] 04/21/2015  . Protein-calorie malnutrition, severe ([place]) [E43] 01/15/2015  . MDD (major depressive disorder), recurrent episode, severe ([place]) [F33.2] 10/16/2014  . Cocaine use disorder, severe, dependence ([place]) [F14.20] 10/10/2014  . Cannabis use disorder, severe, dependence ([place]) [F12.20] 10/10/2014  . Substance induced mood disorder ([place]) [F19.94] 10/09/2014  . 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 Hepatomegaly [R16.0] 04/11/2014  . Acute pericarditis [I30.9] 03/31/2014  . Inflammatory arthritis [M19.90] 09/24/2013  . 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 Tobacco abuse [Z72.0] 01/03/2013  . hyperlipidemia [E78.5] 01/03/2013  . Essential hypertension [I10] 02/26/2010    Total Time spent with patient: 20 minutes  Subjective:   [name] is a 55 y.o. female patient admitted with extreme leg pain but later upon being discharged expressed suicidal ideation with a plan.   HPI:    Per initial TTS Assessment:   [name] is an 55 y.o. female, who presents voluntarily and unaccompanied to [facility]. Pt reports, she initially came to [facility] because today she has extreme leg pain. Pt reported, yesterday was not bad but today she  can not walk. Pt reported, she came to [facility] and when she was about to be discharged, she said she "was going to kill herself." Thoughtout the assessment, pt reported, "I can't do this anymore; it hurts so bad; when he gets ready for me, I'm ready." Pt cried, grabbed her legs and moved around in the bed during the assessment. Pt reported, she hears her mothers' voice. Pt reported, she was separated from her family 13 years ago and she doesn't know if they are dead or alive.  Pt reported, she  Has not seen her wound doctor (Dr. Posey Pronto) in about a month because he it hurts when he cleans her wounds. Pt reports, the doctor does numb her. Pt denied HI and self-injurious behaviors.   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 She supposed to follow-up with wound care clinic, but has not been in 3 weeks. She states she ran out of her oxycodone as well as her blood pressure medicine this morning is experienced increased pain. She's been doing her own wound care. She states she does not like to go see Dr. Posey Pronto because it hurts when he debridements and treats her wounds."   Patient seen this morning for am psych evaluation 09/21/2016. According to documentation in epic the patient made multiple suicidal comments earlier this morning.  She had difficulty participating in the assessment due to pain in her legs. Patient was noted to be crying during assessment. The patient continues to endorse suicidal ideation but with no plan. She states "I have been off my psychiatric medications for a long time. I'm  not sure what kind of help I need. I just am in so much pain. I am depressed about other things. Sometimes I hear my mother's voice talking to me. But not right now.  I feel hopeless. I will probably hurt myself if I leave here. I'm hurting so bad right now." Per review of chart patient's last admission was in 2015 at [facility]. At that  time patient was discharged on Risperdal, Remeron, Neurontin, and Wellbutrin. She denies having any recent follow up. Stellar has a history of chronic cocaine abuse with last reported use one week ago. Due to limited ability for patient to engage in assessment would recommend re-starting her Neurontin at 100 mg TID to help with anxiety/pain and starting Cymbalta 20 mg daily for depressive symptoms. Patient can be re-evaluated for final disposition when her pain is under better control. At this time it is unclear if the patient meets inpatient criteria due to the severe nature of her leg pain. Patient was noted to be rolling around in the bed and crying throughout the assessment.    Past Psychiatric History: MDD, Cocaine dependence   Risk to Self: Suicidal Ideation: Yes-Currently Present Suicidal Intent: No Is patient at risk for suicide?: Yes Suicidal Plan?: No Access to Means: No What has been your use of drugs/alcohol within the last 12 months?: Marijuana, cigarettes How many times?: 1 Other Self Harm Risks: NA Triggers for Past Attempts: Other (Comment) (Pt reported because she was in pain.) Intentional Self Injurious Behavior: None (Pt denies. ) Risk to Others: Homicidal Ideation: No (Pt denies. ) Thoughts of Harm to Others: No Current Homicidal Intent: No Current Homicidal Plan: No Access to Homicidal Means: No Identified Victim: NA History of harm to others?: No Assessment of Violence: None Noted Violent Behavior Description: NA Does patient have access to weapons?: Yes (Comment) (Pt reports, knives.) Criminal Charges Pending?: No Does patient have a court date: No Prior Inpatient Therapy: Prior Inpatient Therapy: Yes Prior Therapy Dates: Pt reported, a long time ago.  Prior Therapy Facilty/Provider(s): [facility] Reason for Treatment: SI Prior Outpatient Therapy: Prior Outpatient Therapy: No Prior Therapy Dates: NA Prior Therapy Facilty/Provider(s): NA Reason for Treatment:  NA Does patient have an ACCT team?: Unknown Does patient have Intensive In-House Services?  : No Does patient have [facility] services? : Unknown Does patient have [facility] services?: Unknown  Past Medical History:  Past Medical History:  Diagnosis Date  . CAP (community acquired pneumonia) 03/2014 X 2  . Cocaine abuse    ongoing with resultant vaculitis.  . Depression   . Headache    "weekly" (07/29/2016)  . Hypertension   . Inflammatory arthritis   . Migraines    "probably 5-6/yr" (07/29/2016)  . Normocytic anemia    BL Hgb 9.8-12. Last anemia panel 04/2010 - showing Fe 19, ferritin 101.  Pt on monthly B12 injections  . Rheumatoid arthritis(714.0)    patient reported  . 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 Followed by Dr. Louanne Skye    Past Surgical History:  Procedure Laterality Date  . AMPUTATION Left 05/22/2016   Procedure: AMPUTATION LEFT LONG FINGER;  Surgeon: Marybelle Killings, MD;  Location: [place];  Service: Orthopedics;  Laterality: Left;  . HERNIA REPAIR     "stomach"  . I&D EXTREMITY Right 09/26/2015   Procedure: IRRIGATION AND DEBRIDEMENT LEG WOUND  VAC PLACEMENT.;  Surgeon: Loel Lofty Dillingham, DO;  Location: [place];  Service: Plastics;  Laterality: Right;  . IRRIGATION AND DEBRIDEMENT ABSCESS Bilateral 09/26/2013   Procedure: DEBRIDEMENT ULCERS BILATERAL THIGHS;  Surgeon: Gwenyth Ober, MD;  Location: [place];  Service: General;  Laterality: Bilateral;  . SKIN BIOPSY Bilateral    shin nodules   Family History:  Family History  Problem Relation Age of Onset  . Breast cancer Mother     Breast cancer  . Alcohol abuse Mother   . Colon cancer Maternal Aunt 29  . Alcohol abuse Father    Family Psychiatric  History: See above  Social History:  History  Alcohol Use No     History  Drug Use  . Types: "Crack" cocaine, Cocaine    Comment: 07/29/2016 "only use the one you smoke now., last used ~ 1-2 wk ago"    Social  History   Social History  . Marital status: Single    Spouse name: N/A  . Number of children: 0  . Years of education: 11th grade   Occupational History  . Disability     since 2011, due to her rheumatoid arthritis   Social History Main Topics  . Smoking status: Current Every Day Smoker    Packs/day: 0.12    Years: 38.00    Types: Cigarettes  . Smokeless tobacco: Never Used  . Alcohol use No  . Drug use:     Types: "Crack" cocaine, Cocaine     Comment: 07/29/2016 "only use the one you smoke now., last used ~ 1-2 wk ago"  . Sexual activity: Not Currently   Other Topics Concern  . None   Social History Narrative   Unemployed:  cleaning in past   Living at [place]; has Medicaid.   crack/cocaine use; pt denies IVDU   tobacco:  1/2 ppd, trying to quit   alcohol:  none       Additional Social History:    Allergies:   Allergies  Allergen Reactions  . Acetaminophen Swelling and Other (See Comments)    Reaction:  Eyelid swelling    Labs:  Results for orders placed or performed during the hospital encounter of 09/20/16 (from the past 48 hour(s))  CBC with Differential     Status: Abnormal   Collection Time: 09/21/16  1:06 AM  Result Value Ref Range   WBC 10.8 (H) 4.0 - 10.5 K/uL   RBC 3.84 (L) 3.87 - 5.11 MIL/uL   Hemoglobin 10.7 (L) 12.0 - 15.0 g/dL   HCT 32.7 (L) 36.0 - 46.0 %   MCV 85.2 78.0 - 100.0 fL   MCH 27.9 26.0 - 34.0 pg   MCHC 32.7 30.0 - 36.0 g/dL   RDW 14.1 11.5 - 15.5 %   Platelets 357 150 - 400 K/uL   Neutrophils Relative % 71 %   Neutro Abs 7.8 (H) 1.7 - 7.7 K/uL   Lymphocytes Relative 21 %   Lymphs Abs 2.3 0.7 - 4.0 K/uL   Monocytes Relative 7 %   Monocytes Absolute 0.7 0.1 - 1.0 K/uL   Eosinophils Relative 1 %   Eosinophils Absolute 0.1 0.0 - 0.7 K/uL   Basophils Relative 0 %   Basophils Absolute 0.0 0.0 - 0.1 K/uL  I-stat chem 8, ed     Status: Abnormal   Collection Time: 09/21/16  1:23 AM  Result Value Ref Range   Sodium 143 135  - 145 mmol/L   Potassium 3.8 3.5 - 5.1 mmol/L   Chloride 106 101 - 111 mmol/L   BUN 21 (H)  6 - 20 mg/dL   Creatinine, Ser 1.10 (H) 0.44 - 1.00 mg/dL   Glucose, Bld 81 65 - 99 mg/dL   Calcium, Ion 1.21 1.15 - 1.40 mmol/L   TCO2 28 0 - 100 mmol/L   Hemoglobin 9.9 (L) 12.0 - 15.0 g/dL   HCT 29.0 (L) 36.0 - 46.0 %    Current Facility-Administered Medications  Medication Dose Route Frequency Provider Last Rate Last Dose  . ibuprofen (ADVIL,MOTRIN) tablet 600 mg  600 mg Oral Q8H PRN Junius Creamer, NP      . lisinopril (PRINIVIL,ZESTRIL) tablet 20 mg  20 mg Oral Daily Junius Creamer, NP   20 mg at 09/21/16 0903  . LORazepam (ATIVAN) tablet 1 mg  1 mg Oral Q8H PRN Junius Creamer, NP   1 mg at 09/21/16 0119  . naproxen (NAPROSYN) tablet 500 mg  500 mg Oral Daily PRN Fredia Sorrow, MD   500 mg at 09/21/16 1054  . nicotine (NICODERM CQ - dosed in mg/24 hours) patch 21 mg  21 mg Transdermal Daily Junius Creamer, NP      . ondansetron [facility]) tablet 4 mg  4 mg Oral Q8H PRN Junius Creamer, NP      . oxyCODONE (Oxy IR/ROXICODONE) immediate release tablet 5 mg  5 mg Oral Q8H PRN Junius Creamer, NP       Current Outpatient Prescriptions  Medication Sig Dispense Refill  . doxycycline (VIBRAMYCIN) 100 MG capsule Take 1 capsule (100 mg total) by mouth 2 (two) times daily. (Patient not taking: Reported on 09/15/2016) 20 capsule 0  . lisinopril (PRINIVIL,ZESTRIL) 10 MG tablet Take 2 tablets (20 mg total) by mouth daily. 60 tablet 5  . naproxen sodium (ANAPROX) 220 MG tablet Take 440 mg by mouth daily as needed (pain).    Marland Kitchen oxyCODONE (OXY IR/ROXICODONE) 5 MG immediate release tablet Take 1 tablet (5 mg total) by mouth every 8 (eight) hours as needed for moderate pain. 40 tablet 0    Musculoskeletal:  Unable to assess via camera   Psychiatric Specialty Exam: Physical Exam  Review of Systems  Psychiatric/Behavioral: Positive for depression, substance abuse and suicidal ideas. Negative for hallucinations and memory  loss. The patient is nervous/anxious and has insomnia.     Blood pressure 126/72, pulse 97, temperature 98.6 F (37 C), temperature source Oral, resp. rate 20, SpO2 100 %.There is no height or weight on file to calculate BMI.  General Appearance: Disheveled  Eye Contact:  Minimal  Speech:  Clear and Coherent  Volume:  Varied, increased when crying out in pain   Mood:  Anxious, Depressed and Hopeless  Affect:  Congruent  Thought Process:  Coherent  Orientation:  Full (Time, Place, and Person)  Thought Content:  Symptoms, worries, concerns  Suicidal Thoughts:  Yes.  without intent/plan  Homicidal Thoughts:  No  Memory:  Immediate;   Good Recent;   Fair Remote;   Fair  Judgement:  Poor  Insight:  Shallow  Psychomotor Activity:  Increased and Restlessness  Concentration:  Concentration: Poor and Attention Span: Poor  Recall:  [employer] of Knowledge:  Fair  Language:  Good  Akathisia:  No  Handed:  Right  AIMS (if indicated):     Assets:  Communication Skills Desire for Improvement Financial Resources/Insurance Leisure Time Resilience Social Support  ADL's:  Intact  Cognition:  WNL  Sleep:        Treatment Plan Summary: Plan to re-evaluate patient tomorrow after re-starting her psychiatric medications and when her  pain is under better control.   -Start Neurontin 100 mg TID for chronic pain/anxiety -Start Cymbalta 20 mg daily for chronic pain/depression  Disposition: Am psych evaluation 09/22/2016 for re-assessment to determine final disposition.   Elmarie Shiley, NP 09/21/2016 1:24 PM  Agree with NP assessment as above

## 2016-09-21 NOTE — ED Notes (Signed)
Pt resting quietly upon RN arrival to room. Pt gently awoken to speak to telepsych counselor.

## 2016-09-21 NOTE — ED Notes (Signed)
Pt given snack.

## 2016-09-21 NOTE — ED Notes (Signed)
Sitter to desk to ask for pain medication for patient.  Patient is not due for medications until 2200 and 2300.

## 2016-09-21 NOTE — ED Notes (Signed)
Regular diet order for breakfast was called in for pt.

## 2016-09-22 IMAGING — CR DG CHEST 2V
2 series · 2 of 2 positions shown · non-contrast
Comparison: 05/07/2014

CLINICAL DATA: Upper chest pain for 24 hr, worsening.

EXAM:
CHEST  2 VIEW

[w chest pa]
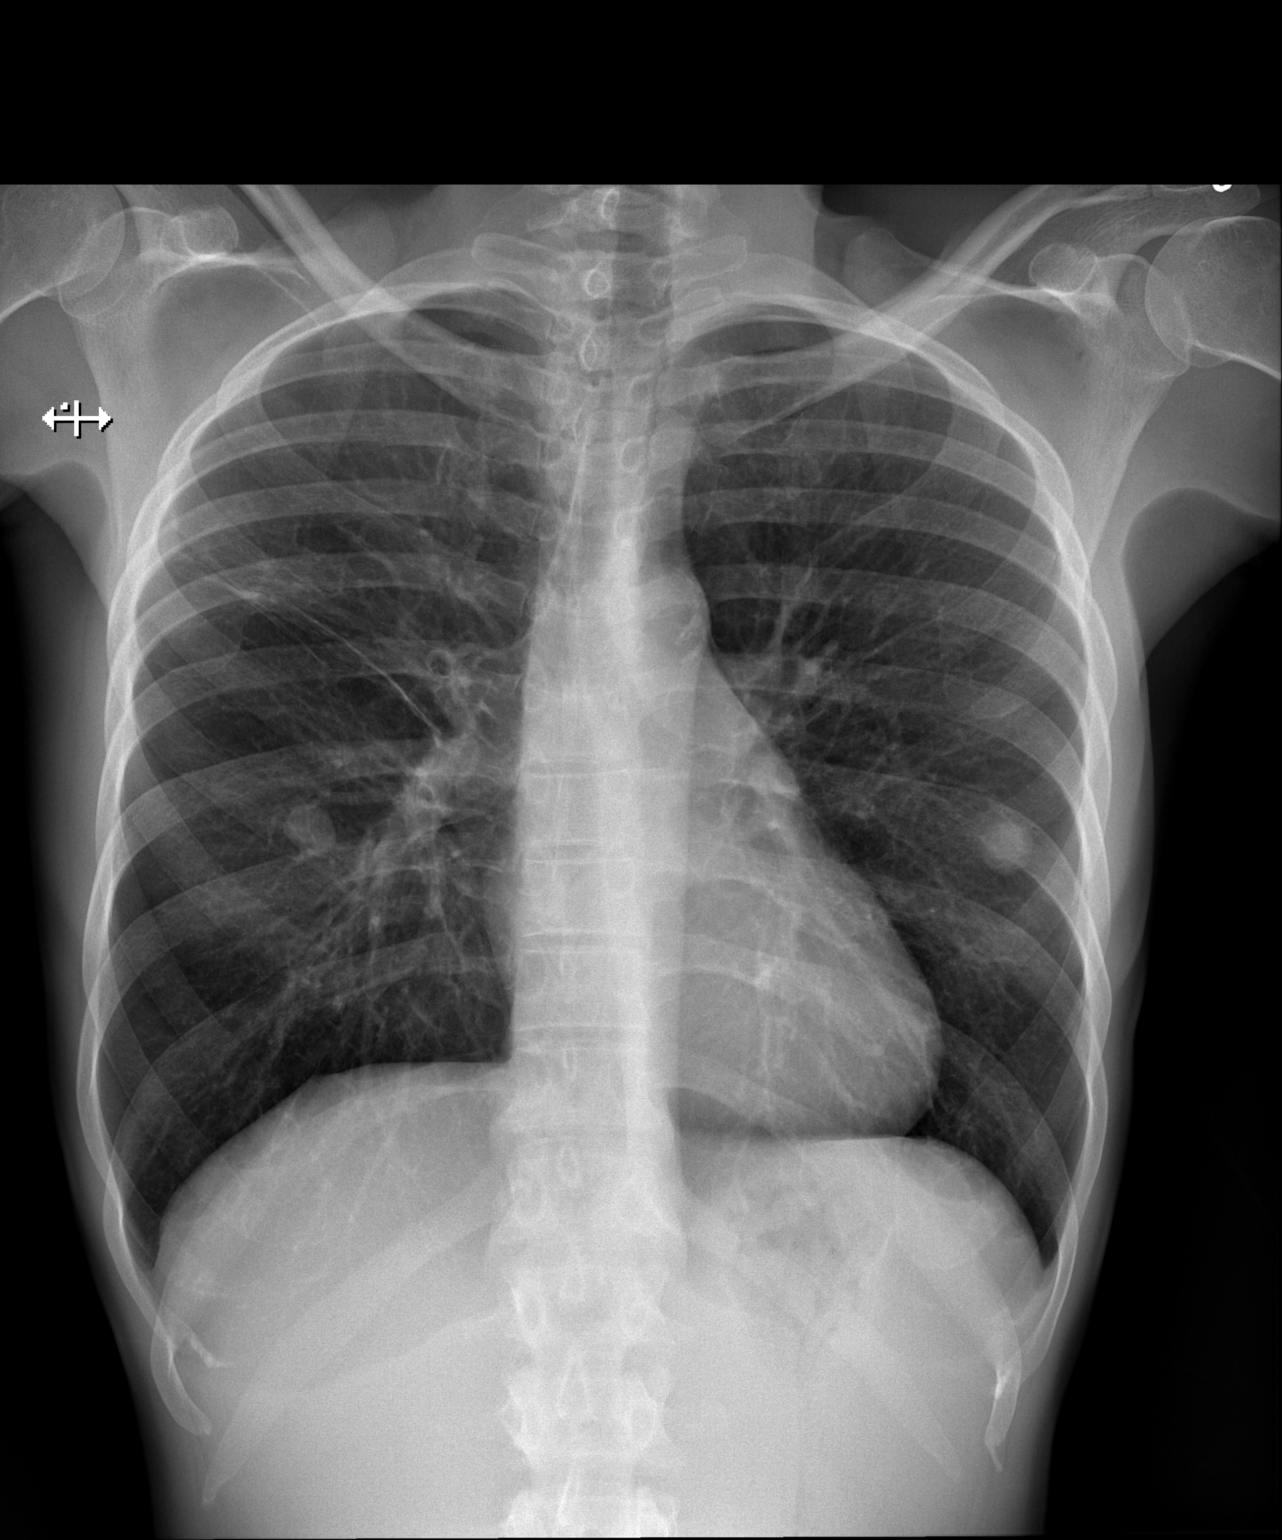

[w chest lat]
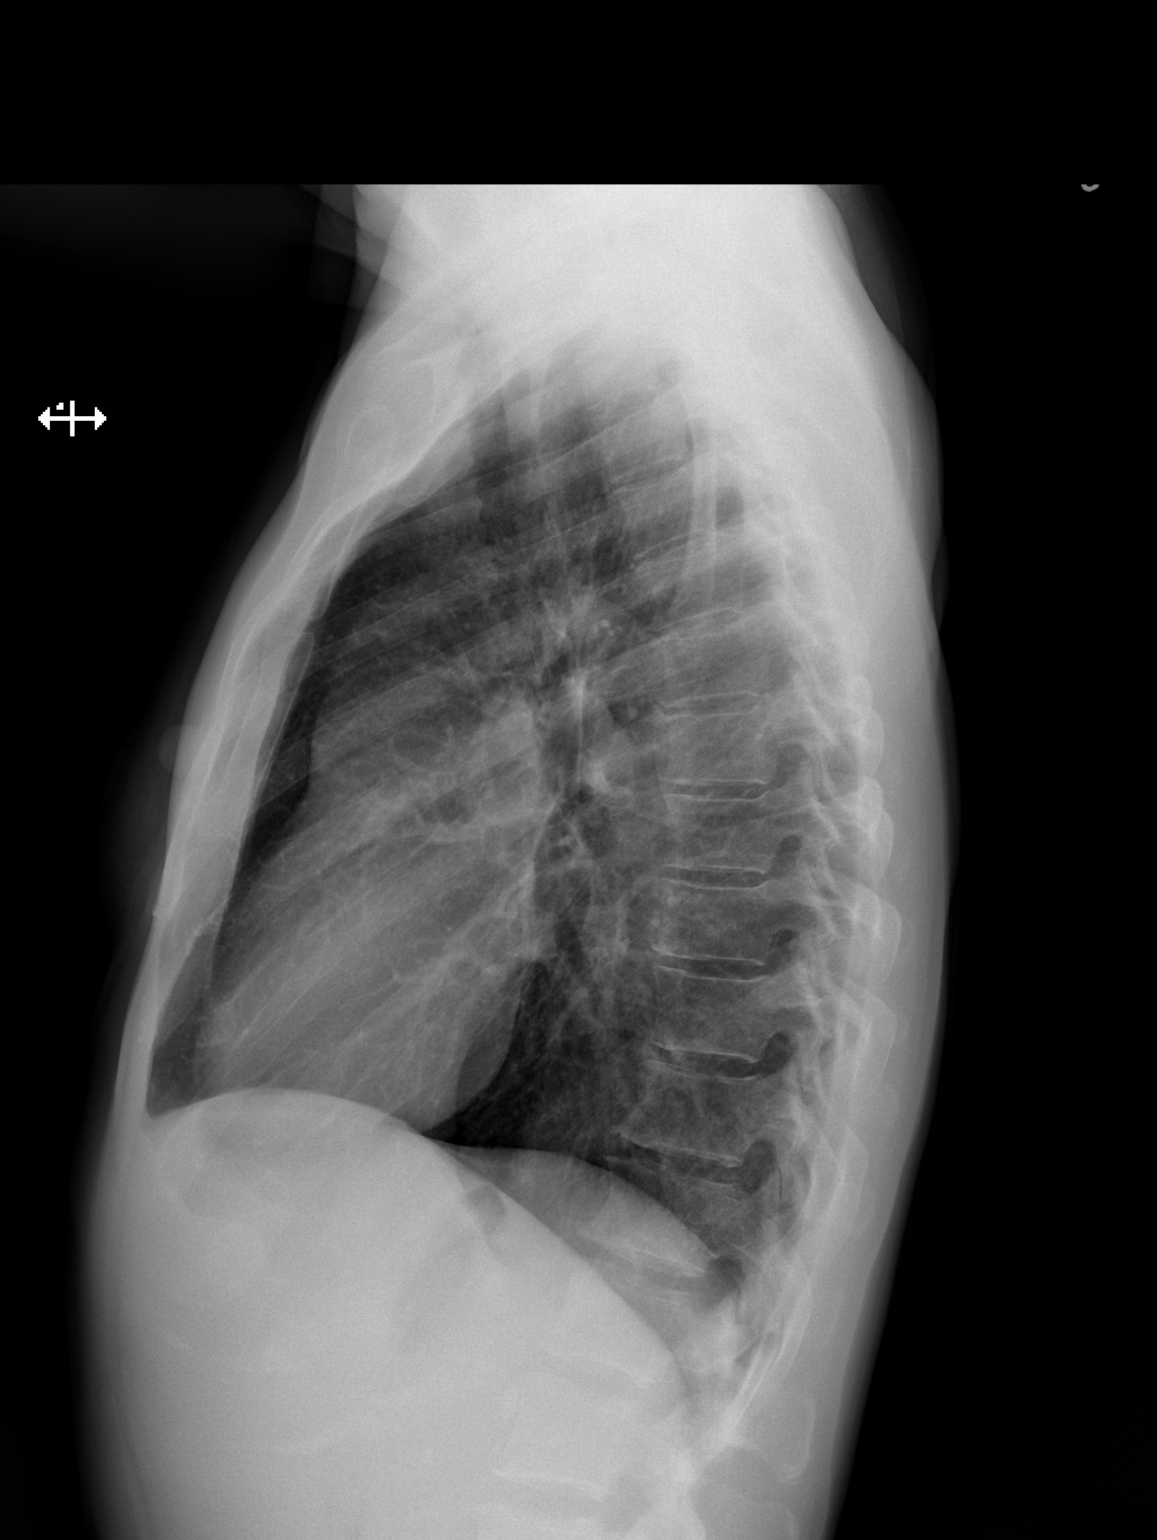

[2 of 2 positions shown; findings below may reference images not displayed]

FINDINGS: Normal heart size and pulmonary vascularity. Pulmonary
hyperinflation. Linear scarring in the right upper lung. No focal
airspace disease or consolidation. No blunting of costophrenic
angles. No pneumothorax. Prominent nodular opacities over the mid
lungs consistent with prominent nipple shadows. No change since
prior study.
IMPRESSION: No active cardiopulmonary disease.

## 2016-09-22 NOTE — Progress Notes (Signed)
Follow up TTS/ Assessment: Patient denies SI plan, states some SI thoughts with primary concern currently with leg pain. Patient denies HI and states had some AVH "a couple days ago" , but none noted currently. Consult with [name], NP states does not meet inpatient criteria. Alanzo Lamb K. Nash Shearer, LPC-A, [facility]  Counselor 09/22/2016 2:44 PM

## 2016-09-22 NOTE — Discharge Planning (Signed)
Pt up for discharge. [facility] reviewed chart for possible CM needs. Pt currently active with [facility] for [facility] services.  Resumption of care requested.  No DME needs identified at this time.  No needs identified or communicated.

## 2016-09-22 NOTE — ED Notes (Signed)
Pt began sleeping during middle of assessment - woke easily to voice. States uses w/c and walker at home.

## 2016-09-22 NOTE — ED Notes (Signed)
Dr Leonette [facility] aware pt cleared psych from [facility].

## 2016-09-22 NOTE — ED Notes (Signed)
Pt awake used bathroom and given a sprite

## 2016-09-22 NOTE — ED Notes (Signed)
Regular diet was ordered for lunch, and Pt. Was given a snack and drink.

## 2016-09-22 NOTE — ED Notes (Signed)
Pt requesting dressing change to legs due to drainage.  Telfa non-adherent dressings and Kerlix gauze rolls used to dress RLE and LLE plus L toes.

## 2016-09-22 NOTE — ED Notes (Signed)
Security escorted pt to bus stop per pt's request via w/c. Pt given bus pass as requested. States she will be able to get on and off bus w/o difficulty - when questioned by RN x 2.

## 2016-09-22 NOTE — ED Notes (Signed)
Woke pt for TTS - being performed at this time.

## 2016-09-26 ENCOUNTER — Emergency Department (HOSPITAL_COMMUNITY)
Admission: EM | Admit: 2016-09-26 | Discharge: 2016-09-26 | Disposition: A | Discharge status: Home / Self Care | Payer: Medicaid Other | Attending: Emergency Medicine | Admitting: Emergency Medicine

## 2016-09-26 ENCOUNTER — Encounter (HOSPITAL_COMMUNITY): Payer: Self-pay | Admitting: Emergency Medicine

## 2016-09-26 DIAGNOSIS — Z9119 Patient's noncompliance with other medical treatment and regimen: Secondary | ICD-10-CM | POA: Insufficient documentation

## 2016-09-26 DIAGNOSIS — I10 Essential (primary) hypertension: Secondary | ICD-10-CM | POA: Insufficient documentation

## 2016-09-26 DIAGNOSIS — Z91199 Patient's noncompliance with other medical treatment and regimen due to unspecified reason: Secondary | ICD-10-CM

## 2016-09-26 DIAGNOSIS — L97912 Non-pressure chronic ulcer of unspecified part of right lower leg with fat layer exposed: Secondary | ICD-10-CM | POA: Diagnosis not present

## 2016-09-26 DIAGNOSIS — M79605 Pain in left leg: Secondary | ICD-10-CM | POA: Diagnosis present

## 2016-09-26 DIAGNOSIS — F17210 Nicotine dependence, cigarettes, uncomplicated: Secondary | ICD-10-CM | POA: Insufficient documentation

## 2016-09-26 DIAGNOSIS — L97922 Non-pressure chronic ulcer of unspecified part of left lower leg with fat layer exposed: Secondary | ICD-10-CM | POA: Diagnosis not present

## 2016-09-26 LAB — BASIC METABOLIC PANEL
ANION GAP: 8 (ref 5–15)
BUN: 18 mg/dL (ref 6–20)
CALCIUM: 9.3 mg/dL (ref 8.9–10.3)
CO2: 25 mmol/L (ref 22–32)
Chloride: 109 mmol/L (ref 101–111)
Creatinine, Ser: 0.94 mg/dL (ref 0.44–1.00)
GFR calc non Af Amer: >60 mL/min (ref >60)
Glucose, Bld: 100 mg/dL — ABNORMAL HIGH (ref 65–99)
Potassium: 3.9 mmol/L (ref 3.5–5.1)
SODIUM: 142 mmol/L (ref 135–145)

## 2016-09-26 LAB — CBC WITH DIFFERENTIAL/PLATELET
BASOS ABS: 0 10*3/uL (ref 0.0–0.1)
BASOS PCT: 0 %
Eosinophils Absolute: 0.1 10*3/uL (ref 0.0–0.7)
Eosinophils Relative: 2 %
HEMATOCRIT: 32.8 % — AB (ref 36.0–46.0)
HEMOGLOBIN: 11 g/dL — AB (ref 12.0–15.0)
Lymphocytes Relative: 36 %
Lymphs Abs: 2.2 10*3/uL (ref 0.7–4.0)
MCH: 28.3 pg (ref 26.0–34.0)
MCHC: 33.5 g/dL (ref 30.0–36.0)
MCV: 84.3 fL (ref 78.0–100.0)
Monocytes Absolute: 0.4 10*3/uL (ref 0.1–1.0)
Monocytes Relative: 6 %
NEUTROS ABS: 3.4 10*3/uL (ref 1.7–7.7)
NEUTROS PCT: 56 %
Platelets: 386 10*3/uL (ref 150–400)
RBC: 3.89 MIL/uL (ref 3.87–5.11)
RDW: 13.8 % (ref 11.5–15.5)
WBC: 6.1 10*3/uL (ref 4.0–10.5)

## 2016-09-26 LAB — I-STAT CG4 LACTIC ACID, ED: LACTIC ACID, VENOUS: 0.42 mmol/L — AB (ref 0.5–1.9)

## 2016-09-26 MED ORDER — OXYCODONE HCL 5 MG PO TABS
10.0000 mg | ORAL_TABLET | Freq: Once | ORAL | Status: AC
  Administered 2016-09-26: 10 mg via ORAL
  Filled 2016-09-26: qty 2

## 2016-09-26 MED ORDER — OXYCODONE HCL 10 MG PO TABS: 10.0000 mg | ORAL_TABLET | ORAL | 0 refills | Status: DC | PRN

## 2016-09-26 NOTE — ED Notes (Signed)
Pt is in stable condition upon d/c and is escorted from ED via wheelchair. 

## 2016-09-26 NOTE — ED Notes (Signed)
Pt tearful on exam, states she did have home visit from home health but they did not change bandages to lower extremities. Foul odor noted in room.   Pt states she has taken all her pain medication.  Pt seen here 09/20/16 for same, pt reports she has not changed the bandages since they were wrapped at this facility on that date.

## 2016-09-26 NOTE — ED Triage Notes (Signed)
Pt transported from home by EMS with c/o bilat lower extremity pain, pt has large open venous stasis ulcer to BLE. Pt seen several times recently for same.

## 2016-09-26 NOTE — ED Provider Notes (Signed)
[facility] DEPT Provider Note   CSN: [id number] Arrival date & time: 09/26/16  0330     History   Chief Complaint Chief Complaint  Patient presents with  . Wound Check    HPI [name] is a 55 y.o. female.  She has a history of ulcers in both legs with chronic pain. She is complaining of increased pain in both of her legs. She rates pain at 8/10. She states she had run out of her oxycodone for pain. She does endorse subjective fever at home with chills and sweats. She is supposed to go to the wound care clinic, but has not gone in a long time because the provider there hurt her.   The history is provided by the patient.  Wound Check     Past Medical History:  Diagnosis Date  . CAP (community acquired pneumonia) 03/2014 X 2  . Cocaine abuse    ongoing with resultant vaculitis.  . Depression   . Headache    "weekly" (07/29/2016)  . Hypertension   . Inflammatory arthritis   . Migraines    "probably 5-6/yr" (07/29/2016)  . Normocytic anemia    BL Hgb 9.8-12. Last anemia panel 04/2010 - showing Fe 19, ferritin 101.  Pt on monthly B12 injections  . Rheumatoid arthritis(714.0)    patient reported  . 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 Followed by Dr. Louanne Skye    Patient Active Problem List   Diagnosis Date Noted  . Acute kidney injury ([place]) 09/09/2016  . Palliative care encounter   . Wound drainage   . Infectious myositis   . Wound dehiscence   . Finger necrosis ([place]) 05/18/2016  . AKI (acute kidney injury) ([place])   . Leg ulcer ([place])   . 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 ([place]) 06/04/2015  . Pressure ulcer 04/22/2015  . Skin necrosis ([place]) 04/21/2015  . Protein-calorie malnutrition, severe ([place]) 01/15/2015  . MDD (major depressive disorder), recurrent episode, severe ([place]) 10/16/2014  . Cocaine use disorder, severe, dependence ([place]) 10/10/2014  . Cannabis use disorder, severe, dependence ([place])  10/10/2014  . Substance induced mood disorder ([place]) 10/09/2014  . 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 Hepatomegaly 04/11/2014  . Acute pericarditis 03/31/2014  . Inflammatory arthritis 09/24/2013  . 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 Tobacco abuse 01/03/2013  . hyperlipidemia 01/03/2013  . Essential hypertension 02/26/2010    Past Surgical History:  Procedure Laterality Date  . AMPUTATION Left 05/22/2016   Procedure: AMPUTATION LEFT LONG FINGER;  Surgeon: Marybelle Killings, MD;  Location: [facility];  Service: Orthopedics;  Laterality: Left;  . HERNIA REPAIR     "stomach"  . I&D EXTREMITY Right 09/26/2015   Procedure: IRRIGATION AND DEBRIDEMENT LEG WOUND  VAC PLACEMENT.;  Surgeon: Loel Lofty Dillingham, DO;  Location: [facility];  Service: Plastics;  Laterality: Right;  . IRRIGATION AND DEBRIDEMENT ABSCESS Bilateral 09/26/2013   Procedure: DEBRIDEMENT ULCERS BILATERAL THIGHS;  Surgeon: Gwenyth Ober, MD;  Location: [facility];  Service: General;  Laterality: Bilateral;  . SKIN BIOPSY Bilateral    shin nodules    OB History    No data available       Home Medications    Prior to Admission medications   Medication Sig Start Date End Date Taking? Authorizing Provider  ibuprofen (ADVIL,MOTRIN) 200 MG tablet Take 200 mg by mouth every 6 (six) hours as needed (pain).    Historical Provider, MD  lisinopril (PRINIVIL,ZESTRIL) 10 MG tablet Take 2 tablets (20 mg total) by mouth daily. 09/20/16   Junius Creamer, NP  naproxen sodium (ANAPROX) 220 MG tablet Take 440 mg by mouth daily as needed (pain).    Historical Provider, MD  oxyCODONE (OXY IR/ROXICODONE) 5 MG immediate release tablet Take 1 tablet (5 mg total) by mouth every 8 (eight) hours as needed for moderate pain. 09/20/16   Junius Creamer, NP    Family History Family History  Problem Relation Age of Onset  . Breast cancer Mother     Breast cancer  . Alcohol abuse Mother   . Colon cancer Maternal Aunt 8  . Alcohol abuse Father      Social History Social History  Substance Use Topics  . Smoking status: Current Every Day Smoker    Packs/day: 0.12    Years: 38.00    Types: Cigarettes  . Smokeless tobacco: Never Used  . Alcohol use No     Allergies   Acetaminophen   Review of Systems Review of Systems  All other systems reviewed and are negative.    Physical Exam Updated Vital Signs BP 172/81 (BP Location: Right Arm)   Pulse 74   Temp 97.8 F (36.6 C) (Oral)   Resp 16   SpO2 100%   Physical Exam  Nursing note and vitals reviewed.  Somewhat cachectic 55 year old female, Crying in pain, but his in no acute distress. Vital signs are significant for hypertension. Oxygen saturation is 100%, which is normal. Head is normocephalic and atraumatic. PERRLA, EOMI. Oropharynx is clear. Neck is nontender and supple without adenopathy or JVD. Back is nontender and there is no CVA tenderness. Lungs are clear without rales, wheezes, or rhonchi. Chest is nontender. Heart has regular rate and rhythm without murmur. Abdomen is soft, flat, nontender without masses or hepatosplenomegaly and peristalsis is normoactive. Extremities: Ulcers are present on both lower legs. There is an ulcer on the left lower leg medially, just proximal to the medial malleolus. This ulcer measures 3 cm x 4 cm. Ulcer is into the subcutaneous fat but no bone or muscle is exposed. Right leg has a large ulceration posteriorly. This measures 12 x 5 cm. This also goes into fat but not muscle or bone. There is bleeding from both of the ulcers but more on the right. She also has areas on her feet where the skin is thinned but not ulcerated in the region of the first MTP joint. Skin is warm and dry without rash. Neurologic: Mental status is normal, cranial nerves are intact, there are no motor or sensory deficits.  ED Treatments / Results  Labs (all labs ordered are listed, but only abnormal results are displayed) Labs Reviewed  CBC WITH  DIFFERENTIAL/PLATELET - Abnormal; Notable for the following:       Result Value   Hemoglobin 11.0 (*)    HCT 32.8 (*)    All other components within normal limits  BASIC METABOLIC PANEL - Abnormal; Notable for the following:    Glucose, Bld 100 (*)    All other components within normal limits  I-STAT CG4 LACTIC ACID, ED - Abnormal; Notable for the following:    Lactic Acid, Venous 0.42 (*)    All other components within normal limits   Procedures Procedures (including critical care time)  Medications Ordered in ED Medications  oxyCODONE (Oxy IR/ROXICODONE) immediate release tablet 10 mg (not administered)     Initial Impression / Assessment and Plan / ED Course  I have reviewed the triage vital signs and the nursing notes.  Pertinent lab results that were available during my care of the patient were reviewed by me and considered in my medical decision making (see chart for details).  Clinical Course    Chronic skin ulcers with chronic pain associated with them. Old records are reviewed, and she has several ED visits related to these ulcers. Review of her record on the [place] controlled substance reporting website shows that she had prescription for 40 oxycodone 5 mg tablets filled 4 days ago. When asked about this, patient states that her pain had increased so she had to take 2 at a time. Prior to that, she had a prescription for 20 oxycodone 5 mg tablets filled on December 8. Screening labs will be checked and she is given a dose of oxycodone.  She had good relief of pain with oxycodone 10 mg. She does not seem to be abusing the oxycodone, but her requirement seems to be for higher dose. Laboratory workup shows mild anemia which is unchanged. No increase in WBCs. Patient is encouraged to follow-up with wound management clinic and she is discharged with prescription for oxycodone.  Final Clinical Impressions(s) / ED Diagnoses   Final diagnoses:  Chronic ulcer of lower  extremity, right, with fat layer exposed ([place])  Chronic ulcer of left lower extremity with fat layer exposed ([place])  Noncompliance    New Prescriptions New Prescriptions   OXYCODONE HCL 10 MG TABS    Take 1 tablet (10 mg total) by mouth every 4 (four) hours as needed (severe pain).     Delora Fuel, MD 94/70/76 [id number]

## 2016-09-26 NOTE — Discharge Instructions (Signed)
Continue home wound care. Make an appointment at the wound management clinic.

## 2016-09-26 NOTE — ED Notes (Signed)
Pt eating multiple items and drinking sprite at this time

## 2016-10-03 ENCOUNTER — Emergency Department (HOSPITAL_COMMUNITY)
Admission: EM | Admit: 2016-10-03 | Discharge: 2016-10-03 | Disposition: A | Discharge status: Home / Self Care | Payer: Medicaid Other | Attending: Emergency Medicine | Admitting: Emergency Medicine

## 2016-10-03 ENCOUNTER — Encounter (HOSPITAL_COMMUNITY): Payer: Self-pay | Admitting: Nurse Practitioner

## 2016-10-03 DIAGNOSIS — X58XXXD Exposure to other specified factors, subsequent encounter: Secondary | ICD-10-CM | POA: Diagnosis not present

## 2016-10-03 DIAGNOSIS — F17210 Nicotine dependence, cigarettes, uncomplicated: Secondary | ICD-10-CM | POA: Insufficient documentation

## 2016-10-03 DIAGNOSIS — I10 Essential (primary) hypertension: Secondary | ICD-10-CM | POA: Insufficient documentation

## 2016-10-03 DIAGNOSIS — Z79899 Other long term (current) drug therapy: Secondary | ICD-10-CM | POA: Diagnosis not present

## 2016-10-03 DIAGNOSIS — S81802D Unspecified open wound, left lower leg, subsequent encounter: Secondary | ICD-10-CM | POA: Insufficient documentation

## 2016-10-03 DIAGNOSIS — S81801D Unspecified open wound, right lower leg, subsequent encounter: Secondary | ICD-10-CM | POA: Insufficient documentation

## 2016-10-03 DIAGNOSIS — S8992XD Unspecified injury of left lower leg, subsequent encounter: Secondary | ICD-10-CM | POA: Diagnosis present

## 2016-10-03 MED ORDER — KETOROLAC TROMETHAMINE 60 MG/2ML IM SOLN
60.0000 mg | Freq: Once | INTRAMUSCULAR | Status: AC
  Administered 2016-10-03: 60 mg via INTRAMUSCULAR
  Filled 2016-10-03: qty 2

## 2016-10-03 MED ORDER — HALOPERIDOL LACTATE 5 MG/ML IJ SOLN
5.0000 mg | Freq: Once | INTRAMUSCULAR | Status: AC
  Administered 2016-10-03: 5 mg via INTRAMUSCULAR
  Filled 2016-10-03: qty 1

## 2016-10-03 NOTE — ED Notes (Signed)
Pt screaming & tearful during vital signs

## 2016-10-03 NOTE — ED Notes (Signed)
Pt has eaten 2 [place] sandwiches and had one Sprite without vomiting or difficulty.

## 2016-10-03 NOTE — ED Provider Notes (Signed)
[facility] DEPT Provider Note   CSN: [id number] Arrival date & time: 10/03/16  0957     History   Chief Complaint Chief Complaint  Patient presents with  . Leg Pain    bilateral  . Ulcer    vasculitis    HPI [name] is a 55 y.o. female.  She presented by EMS, for leg pain. She is writhing in pain, crying profusely, and does not answer questions.  Level V caveat- altered mental status  HPI  Past Medical History:  Diagnosis Date  . CAP (community acquired pneumonia) 03/2014 X 2  . Cocaine abuse    ongoing with resultant vaculitis.  . Depression   . Headache    "weekly" (07/29/2016)  . Hypertension   . Inflammatory arthritis   . Migraines    "probably 5-6/yr" (07/29/2016)  . Normocytic anemia    BL Hgb 9.8-12. Last anemia panel 04/2010 - showing Fe 19, ferritin 101.  Pt on monthly B12 injections  . Rheumatoid arthritis(714.0)    patient reported  . 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 Followed by Dr. Louanne Skye    Patient Active Problem List   Diagnosis Date Noted  . Acute kidney injury ([place]) 09/09/2016  . Palliative care encounter   . Wound drainage   . Infectious myositis   . Wound dehiscence   . Finger necrosis ([place]) 05/18/2016  . AKI (acute kidney injury) ([place])   . Leg ulcer ([place])   . 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 ([place]) 06/04/2015  . Pressure ulcer 04/22/2015  . Skin necrosis ([place]) 04/21/2015  . Protein-calorie malnutrition, severe ([place]) 01/15/2015  . MDD (major depressive disorder), recurrent episode, severe ([place]) 10/16/2014  . Cocaine use disorder, severe, dependence ([place]) 10/10/2014  . Cannabis use disorder, severe, dependence ([place]) 10/10/2014  . Substance induced mood disorder ([place]) 10/09/2014  . 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 Hepatomegaly 04/11/2014  . Acute pericarditis 03/31/2014  . Inflammatory arthritis 09/24/2013  . 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 Tobacco abuse 01/03/2013  . hyperlipidemia 01/03/2013  . Essential hypertension 02/26/2010    Past Surgical History:  Procedure Laterality Date  . AMPUTATION Left 05/22/2016   Procedure: AMPUTATION LEFT LONG FINGER;  Surgeon: Marybelle Killings, MD;  Location: [facility];  Service: Orthopedics;  Laterality: Left;  . HERNIA REPAIR     "stomach"  . I&D EXTREMITY Right 09/26/2015   Procedure: IRRIGATION AND DEBRIDEMENT LEG WOUND  VAC PLACEMENT.;  Surgeon: Loel Lofty Dillingham, DO;  Location: [facility];  Service: Plastics;  Laterality: Right;  . IRRIGATION AND DEBRIDEMENT ABSCESS Bilateral 09/26/2013   Procedure: DEBRIDEMENT ULCERS BILATERAL THIGHS;  Surgeon: Gwenyth Ober, MD;  Location: [facility];  Service: General;  Laterality: Bilateral;  . SKIN BIOPSY Bilateral    shin nodules    OB History    No data available       Home Medications    Prior to Admission medications   Medication Sig Start Date End Date Taking? Authorizing Provider  ibuprofen (ADVIL,MOTRIN) 200 MG tablet Take 200 mg by mouth every 6 (six) hours as needed (pain).    Historical Provider, MD  lisinopril (PRINIVIL,ZESTRIL) 10 MG tablet Take 2 tablets (20 mg total) by mouth daily. 09/20/16   Junius Creamer, NP  naproxen sodium (ANAPROX) 220 MG tablet Take 440 mg by mouth daily as needed (pain).    Historical Provider, MD  Oxycodone HCl 10 MG TABS Take  1 tablet (10 mg total) by mouth every 4 (four) hours as needed (severe pain). 25/42/70   Delora Fuel, MD    Family History Family History  Problem Relation Age of Onset  . Breast cancer Mother     Breast cancer  . Alcohol abuse Mother   . Colon cancer Maternal Aunt 63  . Alcohol abuse Father     Social History Social History  Substance Use Topics  . Smoking status: Current Every Day Smoker    Packs/day: 0.12    Years: 38.00    Types: Cigarettes  . Smokeless tobacco: Never Used  . Alcohol use No     Allergies   Acetaminophen   Review of Systems Review of  Systems  Unable to perform ROS: Mental status change     Physical Exam Updated Vital Signs BP 178/95 (BP Location: Right Arm)   Pulse 108   Temp 98.2 F (36.8 C) (Oral)   Resp 24   SpO2 100%   Physical Exam  Constitutional: She appears well-developed.  Disheveled, undernourished.  HENT:  Head: Normocephalic and atraumatic.  Eyes: Conjunctivae and EOM are normal. Pupils are equal, round, and reactive to light.  Neck: Normal range of motion and phonation normal. Neck supple.  Cardiovascular:  Tachycardic  Pulmonary/Chest: Effort normal. No respiratory distress.  Musculoskeletal: Normal range of motion. She exhibits no deformity.  Moving arms and legs bilaterally. There are wraps on the lower legs bilaterally. Patient does not cooperate with physical examination of the legs at the time of initial evaluation.  Neurological: She is alert. She exhibits normal muscle tone.  No dysarthria.  Skin: Skin is warm and dry.  Psychiatric:  Tearful, agitated, uncooperative.  Nursing note and vitals reviewed.    ED Treatments / Results  Labs (all labs ordered are listed, but only abnormal results are displayed) Labs Reviewed - No data to display  EKG  EKG Interpretation None       Radiology No results found.  Procedures Procedures (including critical care time)  Medications Ordered in ED Medications  haloperidol lactate (HALDOL) injection 5 mg (not administered)  ketorolac (TORADOL) injection 60 mg (not administered)     Initial Impression / Assessment and Plan / ED Course  I have reviewed the triage vital signs and the nursing notes.  Pertinent labs & imaging results that were available during my care of the patient were reviewed by me and considered in my medical decision making (see chart for details).  Clinical Course     Medications  haloperidol lactate (HALDOL) injection 5 mg (not administered)  ketorolac (TORADOL) injection 60 mg (not administered)     Patient Vitals for the past 24 hrs:  BP Temp Temp src Pulse Resp SpO2  10/03/16 1003 178/95 98.2 F (36.8 C) Oral 108 24 100 %    4:14 PM Reevaluation with update and discussion. After initial assessment and treatment, an updated evaluation reveals she is comfortable, calm now. She'll let me evaluate the legs at this time. Her dressings were removed. There is a large wound of the right posterior leg, extending from just above the foot, to the mid calf. The wound has irregular margins, but is approximately 8 x 15 cm. The wound has characteristics of healing with granulation tissue, and no associated purulent drainage, bleeding or proximal streaking. There is a similar but smaller wound of the left medial ankle region, measuring about 3 x 5 cm. Findings discussed with patient, all questions answered. Ryna Beckstrom L  Final Clinical Impressions(s) / ED Diagnoses   Final diagnoses:  Wound of left lower extremity, subsequent encounter  Wound of right lower extremity, subsequent encounter    Nursing Notes Reviewed/ Care Coordinated, and agree without changes. Applicable Imaging Reviewed.  Interpretation of Laboratory Data incorporated into ED treatment  Nursing Notes Reviewed/ Care Coordinated Applicable Imaging Reviewed Interpretation of Laboratory Data incorporated into ED treatment  The patient appears reasonably screened and/or stabilized for discharge and I doubt any other medical condition or other [facility] requiring further screening, evaluation, or treatment in the ED at this time prior to discharge.  Plan: Home Medications- continue; Home Treatments- rest; return here if the recommended treatment, does not improve the symptoms; Recommended follow up- PCP to arrange wound care management as soon as possible   New Prescriptions New Prescriptions   No medications on file     Daleen Bo, MD 10/03/16 1627

## 2016-10-03 NOTE — ED Triage Notes (Signed)
Pt presents to [facility] for complaints of lower leg pain. She has a history of vasculitis and lower leg ulceration. She has been seen for same recently and discharged with wound care instructions. Patient is unable to ambulate. EMS did not remove patient's pants to visualize wounds. Patient screaming in pain and unable to complete full triage history.

## 2016-10-03 NOTE — Discharge Instructions (Signed)
Wash the wounds well with soap and water once a day, then reapply a light gauze bandage.  Call your PCP as soon as possible to arrange for wound care management.  For pain, take Tylenol.

## 2016-10-03 NOTE — ED Notes (Signed)
Pt R calf wound dressed wet to dry. Wound on L ankle also dressed.

## 2016-10-03 NOTE — ED Notes (Signed)
Pt says she feels 'better' and 'can I eat now.'

## 2016-10-03 NOTE — ED Notes (Signed)
Bed: WA06 Expected date: 10/03/16 Expected time: 9:56 AM Means of arrival: Ambulance Comments: Leg sores, unable to ambulate

## 2016-10-08 ENCOUNTER — Encounter (HOSPITAL_COMMUNITY): Payer: Self-pay

## 2016-10-08 ENCOUNTER — Emergency Department (HOSPITAL_COMMUNITY): Payer: Medicaid Other

## 2016-10-08 ENCOUNTER — Emergency Department (HOSPITAL_COMMUNITY)
Admission: EM | Admit: 2016-10-08 | Discharge: 2016-10-08 | Disposition: A | Discharge status: Home / Self Care | Payer: Medicaid Other | Attending: Emergency Medicine | Admitting: Emergency Medicine

## 2016-10-08 DIAGNOSIS — M79605 Pain in left leg: Secondary | ICD-10-CM | POA: Diagnosis present

## 2016-10-08 DIAGNOSIS — Z79899 Other long term (current) drug therapy: Secondary | ICD-10-CM | POA: Diagnosis not present

## 2016-10-08 DIAGNOSIS — L97522 Non-pressure chronic ulcer of other part of left foot with fat layer exposed: Secondary | ICD-10-CM | POA: Insufficient documentation

## 2016-10-08 DIAGNOSIS — L97922 Non-pressure chronic ulcer of unspecified part of left lower leg with fat layer exposed: Secondary | ICD-10-CM

## 2016-10-08 DIAGNOSIS — F17210 Nicotine dependence, cigarettes, uncomplicated: Secondary | ICD-10-CM | POA: Insufficient documentation

## 2016-10-08 DIAGNOSIS — I10 Essential (primary) hypertension: Secondary | ICD-10-CM | POA: Diagnosis not present

## 2016-10-08 DIAGNOSIS — M25532 Pain in left wrist: Secondary | ICD-10-CM | POA: Diagnosis not present

## 2016-10-08 LAB — CBC
HCT: 31.8 % — ABNORMAL LOW (ref 36.0–46.0)
HEMOGLOBIN: 10.5 g/dL — AB (ref 12.0–15.0)
MCH: 27.6 pg (ref 26.0–34.0)
MCHC: 33 g/dL (ref 30.0–36.0)
MCV: 83.5 fL (ref 78.0–100.0)
Platelets: 328 10*3/uL (ref 150–400)
RBC: 3.81 MIL/uL — ABNORMAL LOW (ref 3.87–5.11)
RDW: 14.6 % (ref 11.5–15.5)
WBC: 7.3 10*3/uL (ref 4.0–10.5)

## 2016-10-08 LAB — BASIC METABOLIC PANEL
ANION GAP: 10 (ref 5–15)
BUN: 31 mg/dL — AB (ref 6–20)
CHLORIDE: 111 mmol/L (ref 101–111)
CO2: 20 mmol/L — ABNORMAL LOW (ref 22–32)
Calcium: 9.1 mg/dL (ref 8.9–10.3)
Creatinine, Ser: 1.17 mg/dL — ABNORMAL HIGH (ref 0.44–1.00)
GFR calc Af Amer: >60 mL/min (ref >60)
GFR, EST NON AFRICAN AMERICAN: 52 mL/min — AB (ref >60)
GLUCOSE: 111 mg/dL — AB (ref 65–99)
POTASSIUM: 3.7 mmol/L (ref 3.5–5.1)
Sodium: 141 mmol/L (ref 135–145)

## 2016-10-08 LAB — I-STAT TROPONIN, ED
TROPONIN I, POC: 0.01 ng/mL (ref 0.00–0.08)
Troponin i, poc: 0 ng/mL (ref 0.00–0.08)

## 2016-10-08 MED ORDER — OXYCODONE HCL 5 MG PO TABS
5.0000 mg | ORAL_TABLET | Freq: Once | ORAL | Status: AC
  Administered 2016-10-08: 5 mg via ORAL
  Filled 2016-10-08: qty 1

## 2016-10-08 MED ORDER — HYDROMORPHONE HCL 2 MG/ML IJ SOLN
1.0000 mg | Freq: Once | INTRAMUSCULAR | Status: AC
  Administered 2016-10-08: 1 mg via INTRAVENOUS
  Filled 2016-10-08: qty 1

## 2016-10-08 MED ORDER — OXYCODONE HCL 5 MG PO TABS
5.0000 mg | ORAL_TABLET | Freq: Once | ORAL | Status: AC | PRN
  Administered 2016-10-08: 5 mg via ORAL
  Filled 2016-10-08: qty 1

## 2016-10-08 MED ORDER — SODIUM CHLORIDE 0.9 % IV BOLUS (SEPSIS)
1000.0000 mL | Freq: Once | INTRAVENOUS | Status: AC
  Administered 2016-10-08: 1000 mL via INTRAVENOUS

## 2016-10-08 NOTE — ED Notes (Signed)
ER PA aware of pt's vitals at this time

## 2016-10-08 NOTE — ED Notes (Signed)
Pt transported to xray 

## 2016-10-08 NOTE — ED Notes (Signed)
Pt's legs cleaned and redressed barrier cream applied to pt's bottom and lotion applied to pt. Wound beds were cleaned and new dressings applied.  Pt continues to moan and yell throughout procedure ER PA made aware

## 2016-10-08 NOTE — ED Notes (Signed)
Pt asked for pain meds. Informed York Cerise - PA and Ubaldo Glassing - RN.

## 2016-10-08 NOTE — ED Notes (Signed)
Pt conitnues to moan and yell in to hallway

## 2016-10-08 NOTE — ED Notes (Signed)
Pt brought back to room appears very tearful pt has both legs wrapped in bandages with exodous from wounds seeping through dressings

## 2016-10-08 NOTE — ED Notes (Signed)
Pt returned to room from xray.

## 2016-10-08 NOTE — ED Notes (Signed)
Patient transported to X-ray 

## 2016-10-08 NOTE — ED Provider Notes (Signed)
[facility] [facility] DEPT Provider Note   CSN: [id number] Arrival date & time: 10/08/16  1008     History   Chief Complaint Chief Complaint  Patient presents with  . Chest Pain  . Leg Pain  . Emesis    HPI [name] is a 56 y.o. female.  The history is provided by the patient and medical records. No language interpreter was used.  Chest Pain   Associated symptoms include cough and leg pain. Pertinent negatives include no abdominal pain, no fever, no headaches, no nausea, no shortness of breath and no vomiting.  Leg Pain    Emesis   Associated symptoms include arthralgias and cough. Pertinent negatives include no abdominal pain, no chills, no diarrhea, no fever and no headaches.   [name] is a 56 y.o. female  with a PMH of chronic ulcers to bilateral LE's, RA, HTN who presents to the Emergency Department complaining of bilateral lower extremity pain and left wrist pain. Patient states she has been doing her own wound care. She initially saw the wound care center, however the debridements were too painful so she did not go back. Pain is worse but wounds themselves are no better or worse. Additionally, she is complaining of chest pain and cough which began yesterday. One episode had a small amount of blood this morning. No further blood seen. No n/v/d or abdominal pain. No blood in the stool. No fevers/chills.    Past Medical History:  Diagnosis Date  . CAP (community acquired pneumonia) 03/2014 X 2  . Cocaine abuse    ongoing with resultant vaculitis.  . Depression   . Headache    "weekly" (07/29/2016)  . Hypertension   . Inflammatory arthritis   . Migraines    "probably 5-6/yr" (07/29/2016)  . Normocytic anemia    BL Hgb 9.8-12. Last anemia panel 04/2010 - showing Fe 19, ferritin 101.  Pt on monthly B12 injections  . Rheumatoid arthritis(714.0)    patient reported  . 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 Followed  by Dr. Louanne Skye    Patient Active Problem List   Diagnosis Date Noted  . Acute kidney injury ([place]) 09/09/2016  . Palliative care encounter   . Wound drainage   . Infectious myositis   . Wound dehiscence   . Finger necrosis ([place]) 05/18/2016  . AKI (acute kidney injury) ([place])   . Leg ulcer ([place])   . 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 ([place]) 06/04/2015  . Pressure ulcer 04/22/2015  . Skin necrosis ([place]) 04/21/2015  . Protein-calorie malnutrition, severe ([place]) 01/15/2015  . MDD (major depressive disorder), recurrent episode, severe ([place]) 10/16/2014  . Cocaine use disorder, severe, dependence ([place]) 10/10/2014  . Cannabis use disorder, severe, dependence ([place]) 10/10/2014  . Substance induced mood disorder ([place]) 10/09/2014  . 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 Hepatomegaly 04/11/2014  . Acute pericarditis 03/31/2014  . Inflammatory arthritis 09/24/2013  . 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 Tobacco abuse 01/03/2013  . hyperlipidemia 01/03/2013  . Essential hypertension 02/26/2010    Past Surgical History:  Procedure Laterality Date  . AMPUTATION Left 05/22/2016   Procedure: AMPUTATION LEFT LONG FINGER;  Surgeon: Marybelle Killings, MD;  Location: [facility];  Service: Orthopedics;  Laterality: Left;  . HERNIA REPAIR     "stomach"  . I&D EXTREMITY Right 09/26/2015   Procedure: IRRIGATION AND DEBRIDEMENT LEG WOUND  VAC PLACEMENT.;  Surgeon: Loel Lofty Dillingham, DO;  Location: [facility];  Service: Plastics;  Laterality: Right;  . IRRIGATION AND DEBRIDEMENT ABSCESS Bilateral 09/26/2013   Procedure: DEBRIDEMENT ULCERS BILATERAL THIGHS;  Surgeon: Gwenyth Ober, MD;  Location: [facility];  Service: General;  Laterality: Bilateral;  . SKIN BIOPSY Bilateral    shin nodules    OB History    No data available       Home Medications    Prior to Admission medications   Medication Sig Start Date End Date Taking? Authorizing Provider  lisinopril (PRINIVIL,ZESTRIL) 10 MG tablet Take 2  tablets (20 mg total) by mouth daily. 09/20/16   Junius Creamer, NP  oxyCODONE (OXY IR/ROXICODONE) 5 MG immediate release tablet Take 5 mg by mouth every 8 (eight) hours as needed. 09/22/16   Historical Provider, MD  Oxycodone HCl 10 MG TABS Take 1 tablet (10 mg total) by mouth every 4 (four) hours as needed (severe pain). Patient taking differently: Take 10 mg by mouth every 4 (four) hours as needed (severe pain). Take 34m once every 4 hours as needed for severe pain. Take with 536mfor total of 1513mer dose 09/23/24/42DavDelora FuelD    Family History Family History  Problem Relation Age of Onset  . Breast cancer Mother     Breast cancer  . Alcohol abuse Mother   . Colon cancer Maternal Aunt 50 74 Alcohol abuse Father     Social History Social History  Substance Use Topics  . Smoking status: Current Every Day Smoker    Packs/day: 0.12    Years: 38.00    Types: Cigarettes  . Smokeless tobacco: Never Used  . Alcohol use No     Allergies   Acetaminophen and Other   Review of Systems Review of Systems  Constitutional: Negative for chills and fever.  HENT: Positive for congestion.   Eyes: Negative for visual disturbance.  Respiratory: Positive for cough. Negative for shortness of breath.   Cardiovascular: Positive for chest pain.  Gastrointestinal: Negative for abdominal pain, diarrhea, nausea and vomiting.  Genitourinary: Negative for dysuria.  Musculoskeletal: Positive for arthralgias.  Skin: Positive for wound.  Neurological: Negative for headaches.     Physical Exam Updated Vital Signs BP 179/74 (BP Location: Left Arm)   Pulse 98   Temp 99.5 F (37.5 C) (Oral)   Resp 18   Ht 5' 1"  (1.549 m)   Wt 44.5 kg   SpO2 100%   BMI 18.52 kg/m   Physical Exam  Constitutional: She is oriented to person, place, and time.  Undernourished, anxious female.   HENT:  Head: Normocephalic and atraumatic.  Cardiovascular: Normal rate, regular rhythm and normal heart sounds.    No murmur heard. Pulmonary/Chest: Effort normal and breath sounds normal. No respiratory distress. She has no wheezes. She has no rales. She exhibits tenderness.  Abdominal: Soft. She exhibits no distension. There is no tenderness.  Musculoskeletal:  Moves all extremities well x 4.   Neurological: She is alert and oriented to person, place, and time.  Skin:  Bilateral lower extremities with chronic appearing wounds. Feet are warm with 2+ DP pulses. No active drainage. No surrounding erythema or swelling.   Nursing note and vitals reviewed.    ED Treatments / Results  Labs (all labs ordered are listed, but only abnormal results are displayed) Labs Reviewed  BASIC METABOLIC PANEL - Abnormal; Notable for the following:       Result Value   CO2 20 (*)    Glucose,  Bld 111 (*)    BUN 31 (*)    Creatinine, Ser 1.17 (*)    GFR calc non Af Amer 52 (*)    All other components within normal limits  CBC - Abnormal; Notable for the following:    RBC 3.81 (*)    Hemoglobin 10.5 (*)    HCT 31.8 (*)    All other components within normal limits  I-STAT TROPOININ, ED  I-STAT TROPOININ, ED    EKG  EKG Interpretation  Date/Time:  Thursday October 08 2016 10:36:48 EST Ventricular Rate:  106 PR Interval:  118 QRS Duration: 74 QT Interval:  374 QTC Calculation: 496 R Axis:   89 Text Interpretation:  Sinus tachycardia Right atrial enlargement Left ventricular hypertrophy with repolarization abnormality Abnormal ECG tachycardia new but otherwise no change since october Confirmed by [facility] MD, JASON [phone]) on 10/08/2016 2:36:47 PM       Radiology Dg Chest 2 View  Result Date: 10/08/2016 CLINICAL DATA:  Fever and hemoptysis EXAM: CHEST  2 VIEW COMPARISON:  August 01, 2016 FINDINGS: There is slight scarring in each upper lobe. There is no edema or consolidation. Heart size and pulmonary vascularity are normal. No adenopathy. There is atherosclerotic calcification in the aorta. No bone  lesions. IMPRESSION: Mild scarring in both upper lobes. No edema or consolidation. Stable cardiac silhouette. There is aortic atherosclerosis. Electronically Signed   By: Lowella Grip III M.D.   On: 10/08/2016 11:21   Dg Wrist Complete Left  Result Date: 10/08/2016 CLINICAL DATA:  Pain and swelling to left posterior wrist for 2-3 days EXAM: LEFT WRIST - COMPLETE 3+ VIEW COMPARISON:  06/14/2016 FINDINGS: There is no evidence of fracture or dislocation. There is no evidence of arthropathy or other focal bone abnormality. Soft tissues are unremarkable. IMPRESSION: Negative. Electronically Signed   By: Rolm Baptise M.D.   On: 10/08/2016 12:32    Procedures Procedures (including critical care time)  Medications Ordered in ED Medications  oxyCODONE (Oxy IR/ROXICODONE) immediate release tablet 5 mg (5 mg Oral Given 10/08/16 1208)  sodium chloride 0.9 % bolus 1,000 mL (0 mLs Intravenous Stopped 10/08/16 1538)  oxyCODONE (Oxy IR/ROXICODONE) immediate release tablet 5 mg (5 mg Oral Given 10/08/16 1433)  HYDROmorphone (DILAUDID) injection 1 mg (1 mg Intravenous Given 10/08/16 1554)     Initial Impression / Assessment and Plan / ED Course  I have reviewed the triage vital signs and the nursing notes.  Pertinent labs & imaging results that were available during my care of the patient were reviewed by me and considered in my medical decision making (see chart for details).  Clinical Course    [name] is a 56 y.o. female who presents to ED for multiple pain related complaints including chronic lower extremity pain, left wrist pain, chest pain. Seen in ED 5 times in the last 6 months for lower extremity pain. This is her first visit this month complaining of chest pain. On exam, she is undernourished and anxious appearing, often not cooperative with examination. Her lower extremity wounds are chronic and do not appear infected today. There is no surrounding erythema or warmth. No purulent discharge. Feet  were soaked in wounds cleaned in ED today by nursing staff. Wounds were redressed. Mildly elevated creatinine and fluids given. Pain controlled in ED today. X-rays negative. Patient was complaining of chest pain along with other musculoskeletal pain. Troponin negative 2. EKG with no significant change from prior tracings. ACS unlikely. Discussed patient's case with social  work who states that patient has home health already for dressing changes. Patient discharged in satisfactory condition and encouraged to follow up with when care and primary care.   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

## 2016-10-08 NOTE — ED Notes (Signed)
Pt back from xray having outbursts of crying spells and than appears calm and asks for something to drink will continue to monitor

## 2016-10-08 NOTE — ED Triage Notes (Signed)
Pt. Reports having chest pain and hemoptysis for a few days.  She also reports having bilateral leg pain with ulcerations.  She has dressings on her legs unable to assess.  Pt. Is crying  In triage.  Very difficulty to triage

## 2016-10-08 NOTE — Discharge Instructions (Signed)
Please take your blood pressure medication each day as directed. It is very important that you follow up with wound care and your primary care provider for continual management of your chronic wounds.Return to ER for new or worsening symptoms, any additional concerns.

## 2016-10-08 NOTE — Discharge Planning (Signed)
Pt currently active with [facility] for wound care services.  Resumption of care requested. Georgiana Shore of [facility] notified.  No DME needs identified at this time.

## 2016-10-14 ENCOUNTER — Inpatient Hospital Stay (HOSPITAL_COMMUNITY)
Admission: EM | Admit: 2016-10-14 | Discharge: 2016-10-24 | DRG: 580 | Disposition: A | Discharge status: Home Health | Payer: Medicaid Other | Attending: Internal Medicine | Admitting: Internal Medicine

## 2016-10-14 ENCOUNTER — Encounter (HOSPITAL_COMMUNITY): Payer: Self-pay

## 2016-10-14 DIAGNOSIS — I999 Unspecified disorder of circulatory system: Secondary | ICD-10-CM

## 2016-10-14 DIAGNOSIS — R000 Tachycardia, unspecified: Secondary | ICD-10-CM | POA: Diagnosis present

## 2016-10-14 DIAGNOSIS — Z89022 Acquired absence of left finger(s): Secondary | ICD-10-CM | POA: Diagnosis not present

## 2016-10-14 DIAGNOSIS — Z811 Family history of alcohol abuse and dependence: Secondary | ICD-10-CM

## 2016-10-14 DIAGNOSIS — L97902 Non-pressure chronic ulcer of unspecified part of unspecified lower leg with fat layer exposed: Secondary | ICD-10-CM | POA: Diagnosis present

## 2016-10-14 DIAGNOSIS — Z886 Allergy status to analgesic agent status: Secondary | ICD-10-CM | POA: Diagnosis not present

## 2016-10-14 DIAGNOSIS — D891 Cryoglobulinemia: Secondary | ICD-10-CM | POA: Diagnosis present

## 2016-10-14 DIAGNOSIS — D649 Anemia, unspecified: Secondary | ICD-10-CM | POA: Diagnosis not present

## 2016-10-14 DIAGNOSIS — Z79899 Other long term (current) drug therapy: Secondary | ICD-10-CM

## 2016-10-14 DIAGNOSIS — I10 Essential (primary) hypertension: Secondary | ICD-10-CM | POA: Diagnosis present

## 2016-10-14 DIAGNOSIS — L97909 Non-pressure chronic ulcer of unspecified part of unspecified lower leg with unspecified severity: Secondary | ICD-10-CM | POA: Diagnosis present

## 2016-10-14 DIAGNOSIS — Z803 Family history of malignant neoplasm of breast: Secondary | ICD-10-CM

## 2016-10-14 DIAGNOSIS — L97919 Non-pressure chronic ulcer of unspecified part of right lower leg with unspecified severity: Principal | ICD-10-CM | POA: Diagnosis present

## 2016-10-14 DIAGNOSIS — Z9114 Patient's other noncompliance with medication regimen: Secondary | ICD-10-CM | POA: Diagnosis not present

## 2016-10-14 DIAGNOSIS — M318 Other specified necrotizing vasculopathies: Secondary | ICD-10-CM | POA: Diagnosis present

## 2016-10-14 DIAGNOSIS — F17210 Nicotine dependence, cigarettes, uncomplicated: Secondary | ICD-10-CM | POA: Diagnosis not present

## 2016-10-14 DIAGNOSIS — L958 Other vasculitis limited to the skin: Secondary | ICD-10-CM | POA: Diagnosis not present

## 2016-10-14 DIAGNOSIS — F1420 Cocaine dependence, uncomplicated: Secondary | ICD-10-CM | POA: Diagnosis present

## 2016-10-14 DIAGNOSIS — L97929 Non-pressure chronic ulcer of unspecified part of left lower leg with unspecified severity: Secondary | ICD-10-CM | POA: Diagnosis present

## 2016-10-14 DIAGNOSIS — F14288 Cocaine dependence with other cocaine-induced disorder: Secondary | ICD-10-CM | POA: Diagnosis not present

## 2016-10-14 DIAGNOSIS — Z9119 Patient's noncompliance with other medical treatment and regimen: Secondary | ICD-10-CM

## 2016-10-14 DIAGNOSIS — I776 Arteritis, unspecified: Secondary | ICD-10-CM | POA: Diagnosis present

## 2016-10-14 DIAGNOSIS — Z79891 Long term (current) use of opiate analgesic: Secondary | ICD-10-CM | POA: Diagnosis not present

## 2016-10-14 DIAGNOSIS — F14988 Cocaine use, unspecified with other cocaine-induced disorder: Secondary | ICD-10-CM | POA: Diagnosis present

## 2016-10-14 LAB — COMPREHENSIVE METABOLIC PANEL
ALT: 11 U/L — ABNORMAL LOW (ref 14–54)
AST: 17 U/L (ref 15–41)
Albumin: 2.9 g/dL — ABNORMAL LOW (ref 3.5–5.0)
Alkaline Phosphatase: 69 U/L (ref 38–126)
Anion gap: 7 (ref 5–15)
BILIRUBIN TOTAL: 0.7 mg/dL (ref 0.3–1.2)
BUN: 17 mg/dL (ref 6–20)
CHLORIDE: 106 mmol/L (ref 101–111)
CO2: 25 mmol/L (ref 22–32)
Calcium: 9 mg/dL (ref 8.9–10.3)
Creatinine, Ser: 0.98 mg/dL (ref 0.44–1.00)
GFR calc non Af Amer: >60 mL/min (ref >60)
Glucose, Bld: 94 mg/dL (ref 65–99)
POTASSIUM: 4.1 mmol/L (ref 3.5–5.1)
Sodium: 138 mmol/L (ref 135–145)
TOTAL PROTEIN: 6.1 g/dL — AB (ref 6.5–8.1)

## 2016-10-14 LAB — CBC WITH DIFFERENTIAL/PLATELET
Basophils Absolute: 0 10*3/uL (ref 0.0–0.1)
Basophils Relative: 0 %
EOS PCT: 1 %
Eosinophils Absolute: 0.1 10*3/uL (ref 0.0–0.7)
HEMATOCRIT: 28.8 % — AB (ref 36.0–46.0)
Hemoglobin: 9.5 g/dL — ABNORMAL LOW (ref 12.0–15.0)
LYMPHS ABS: 2.5 10*3/uL (ref 0.7–4.0)
Lymphocytes Relative: 22 %
MCH: 27.5 pg (ref 26.0–34.0)
MCHC: 33 g/dL (ref 30.0–36.0)
MCV: 83.5 fL (ref 78.0–100.0)
MONO ABS: 0.7 10*3/uL (ref 0.1–1.0)
MONOS PCT: 6 %
NEUTROS ABS: 8 10*3/uL — AB (ref 1.7–7.7)
Neutrophils Relative %: 71 %
PLATELETS: 343 10*3/uL (ref 150–400)
RBC: 3.45 MIL/uL — ABNORMAL LOW (ref 3.87–5.11)
RDW: 14.7 % (ref 11.5–15.5)
WBC: 11.2 10*3/uL — ABNORMAL HIGH (ref 4.0–10.5)

## 2016-10-14 LAB — RAPID URINE DRUG SCREEN, HOSP PERFORMED
AMPHETAMINES: NOT DETECTED
Barbiturates: NOT DETECTED
Benzodiazepines: NOT DETECTED
COCAINE: POSITIVE — AB
OPIATES: NOT DETECTED
Tetrahydrocannabinol: NOT DETECTED

## 2016-10-14 LAB — MRSA PCR SCREENING: MRSA by PCR: POSITIVE — AB

## 2016-10-14 MED ORDER — POLYETHYLENE GLYCOL 3350 17 G PO PACK
17.0000 g | PACK | Freq: Every day | ORAL | Status: DC | PRN
  Filled 2016-10-14: qty 1

## 2016-10-14 MED ORDER — OXYCODONE HCL 5 MG PO TABS
10.0000 mg | ORAL_TABLET | Freq: Once | ORAL | Status: AC
  Administered 2016-10-14: 10 mg via ORAL
  Filled 2016-10-14: qty 2

## 2016-10-14 MED ORDER — BOOST / RESOURCE BREEZE PO LIQD
1.0000 | Freq: Three times a day (TID) | ORAL | Status: DC
  Administered 2016-10-15 – 2016-10-23 (×20): 1 via ORAL

## 2016-10-14 MED ORDER — CHLORHEXIDINE GLUCONATE CLOTH 2 % EX PADS
6.0000 | MEDICATED_PAD | Freq: Every day | CUTANEOUS | Status: AC
  Administered 2016-10-15 – 2016-10-19 (×5): 6 via TOPICAL

## 2016-10-14 MED ORDER — IBUPROFEN 600 MG PO TABS
600.0000 mg | ORAL_TABLET | Freq: Three times a day (TID) | ORAL | Status: DC | PRN
  Administered 2016-10-15 – 2016-10-24 (×19): 600 mg via ORAL
  Filled 2016-10-14 (×19): qty 1

## 2016-10-14 MED ORDER — MUPIROCIN 2 % EX OINT
1.0000 "application " | TOPICAL_OINTMENT | Freq: Two times a day (BID) | CUTANEOUS | Status: AC
  Administered 2016-10-15 – 2016-10-19 (×9): 1 via NASAL
  Filled 2016-10-14 (×2): qty 22

## 2016-10-14 MED ORDER — ENOXAPARIN SODIUM 40 MG/0.4ML ~~LOC~~ SOLN
40.0000 mg | SUBCUTANEOUS | Status: DC
  Administered 2016-10-14 – 2016-10-23 (×9): 40 mg via SUBCUTANEOUS
  Filled 2016-10-14 (×10): qty 0.4

## 2016-10-14 MED ORDER — ENSURE ENLIVE PO LIQD: 237.0000 mL | Freq: Two times a day (BID) | ORAL | Status: DC

## 2016-10-14 MED ORDER — LORAZEPAM 2 MG/ML IJ SOLN
1.0000 mg | Freq: Once | INTRAMUSCULAR | Status: AC
  Administered 2016-10-14: 1 mg via INTRAVENOUS
  Filled 2016-10-14: qty 1

## 2016-10-14 MED ORDER — LISINOPRIL 20 MG PO TABS
20.0000 mg | ORAL_TABLET | Freq: Every day | ORAL | Status: DC
  Administered 2016-10-15 – 2016-10-24 (×10): 20 mg via ORAL
  Filled 2016-10-14 (×10): qty 1

## 2016-10-14 MED ORDER — OXYCODONE HCL 5 MG PO TABS
5.0000 mg | ORAL_TABLET | Freq: Four times a day (QID) | ORAL | Status: DC | PRN
  Administered 2016-10-14 – 2016-10-15 (×2): 5 mg via ORAL
  Filled 2016-10-14 (×2): qty 1

## 2016-10-14 MED ORDER — TAB-A-VITE/IRON PO TABS
1.0000 | ORAL_TABLET | Freq: Every day | ORAL | Status: DC
  Administered 2016-10-15 – 2016-10-24 (×10): 1 via ORAL
  Filled 2016-10-14 (×12): qty 1

## 2016-10-14 MED ORDER — LISINOPRIL 20 MG PO TABS
20.0000 mg | ORAL_TABLET | Freq: Once | ORAL | Status: AC
  Administered 2016-10-14: 20 mg via ORAL
  Filled 2016-10-14: qty 1

## 2016-10-14 MED ORDER — HYDRALAZINE HCL 20 MG/ML IJ SOLN: 2.0000 mg | Freq: Four times a day (QID) | INTRAMUSCULAR | Status: DC | PRN

## 2016-10-14 NOTE — ED Notes (Signed)
Attempted report 

## 2016-10-14 NOTE — Progress Notes (Signed)
CSW engaged with Patient at her bedside. Patient very lethargic. Patient admits to continuing to abuse cocaine despite the negative effects it has on her physical and mental health. CSW reiterated the need to stop using cocaine and provided Patient with substance abuse resources. CSW inquired about nursing home or ALF placement. Patient declined as she does not want to lose her monthly check which would go to the facility. Per previous notes, as of 10/08/2016, Patient was active with [facility] for wound care services, however, Patient reports that she washes and dresses her own wounds at home and has been using apple cider vinegar to clean her wounds.    Lorrine Kin, MSW, LCSW [facility] ED/73M Clinical Social Worker [phone]

## 2016-10-14 NOTE — Care Management Note (Signed)
Case Management Note  Patient Details  Name: [name] MRN: [id number] Date of Birth: 04/01/1961  Subjective/Objective:                    Action/Plan:  Spoke to Angela at [facility] [phone] , patient is no longer active with them . Per [facility] Dy patient was refusing for their nurse to make visits and patient was doing her own wound care. Therefore , [facility] discharged patient from their services.  If patient agreeable to home health at discharge , [facility] will come see patient in hospital and discuss home health with her , before accepting her back. Expected Discharge Date:                  Expected Discharge Plan:  Home/Self Care  In-House Referral:     Discharge planning Services     Post Acute Care Choice:    Choice offered to:     DME Arranged:    DME Agency:     HH Arranged:    HH Agency:     Status of Service:  In process, will continue to follow  If discussed at Long Length of Stay Meetings, dates discussed:    Additional Comments:  Marilu Favre, RN 10/14/2016, 2:29 PM

## 2016-10-14 NOTE — ED Triage Notes (Addendum)
Pt arrives via EMS c/o BL foot pain since Sept 2017. Per EMS, pt was admitted to [facility] ~4 months ago and had a wound vac to her R foot wound. Per EMS, pt washes and dressed her own wounds at home. Pt poor reports she is unsure if she has DM but does have HTN. CBG 131. 170/100, HR 110, RR 24. Pt visibly anxious. Malodorous wounds dressed with gauze noted to both feet. Pt visibly anxious and crying. Pedal pulses 2+ bilaterally.

## 2016-10-14 NOTE — ED Notes (Signed)
EDp made aware of patient BP.

## 2016-10-14 NOTE — ED Provider Notes (Signed)
   Pt very anxious She has been seen previously for these issues Will control pain and reassess    Ripley Fraise, MD 10/14/16 [phone]

## 2016-10-14 NOTE — ED Notes (Signed)
Pt wounds cleaned and dressed with xeroform and gauze. Pt tolerated poorly. Pt then attempted to stand on edge of bed to clean after episode of incontinence. Pt gait unsteady and pt unable to bear weight. PA aware.   PT reports cleaning wounds with apple cider vinegar at home. Instructed pt to clean with soap and water.

## 2016-10-14 NOTE — ED Notes (Signed)
RN attempted to ambulated patient. Patient unable to bear weigh. Patient tearful in pain when patient sitting on side of bed. PA aware.

## 2016-10-14 NOTE — Progress Notes (Signed)
Initial Nutrition Assessment  DOCUMENTATION CODES:   Severe malnutrition in context of chronic illness  INTERVENTION:   -Boost Breeze po TID, each supplement provides 250 kcal and 9 grams of protein -MVI daily -D/c Ensure Enlive po BID, each supplement provides 350 kcal and 20 grams of protein  NUTRITION DIAGNOSIS:   Malnutrition related to chronic illness as evidenced by severe depletion of muscle mass, severe depletion of body fat.  GOAL:   Patient will meet greater than or equal to 90% of their needs  MONITOR:   PO intake, Supplement acceptance, Labs, Weight trends, Skin, I & O's  REASON FOR ASSESSMENT:   Malnutrition Screening Tool    ASSESSMENT:   56 y.o. 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  Her pain has worsened after running out of opiates, and he wounds seem to be stable and not infected.  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   Pt familiar to this RD, due to multiple previous admissions for levamisole vasculitis.   Pt sleeping soundly at time of visit. Pt did not arouse when name was called or when performing exam.   Noted pt consumed 100% of lunch tray, as well as a popsicle on bedside table. Noted Ensure supplements were ordered, however, pt prefers Boost Breeze. RD to adjust order.   Nutrition-Focused physical exam completed. Findings are moderate to severe fat depletion, moderate to severe muscle depletion, and no edema.   Labs reviewed.   Diet Order:  Diet regular Room service appropriate? Yes; Fluid consistency: Thin  Skin:  Wound (see comment) (levamisole vasculitis on rt lower leg, lt lower leg, lt foot)  Last BM:  PTA  Height:   Ht Readings from Last 1 Encounters:  10/14/16 5' 1"  (1.549 m)    Weight:   Wt Readings from Last 1 Encounters:   10/14/16 98 lb (44.5 kg)    Ideal Body Weight:  47.7 kg  BMI:  Body mass index is 18.52 kg/m.  Estimated Nutritional Needs:   Kcal:  1400-1600  Protein:  65-80 grams  Fluid:  >1.4 L  EDUCATION NEEDS:   No education needs identified at this time  [name] A. Jimmye Norman, RD, LDN, CDE Pager: [phone] After hours Pager: [phone]

## 2016-10-14 NOTE — H&P (Signed)
Date: 10/14/2016               Patient Name:  [name] MRN: [id number]  DOB: 1961/04/24 Age / Sex: 56 y.o., female   PCP: Zada Finders, MD         Medical Service: Internal Medicine Teaching Service         Attending Physician: Dr. Annia Belt, MD    First Contact: Dr. Inda Castle Pager: [phone]  Second Contact: Dr. Juleen China Pager: [phone]       After Hours (After 5p/  First Contact Pager: [phone]  weekends / holidays): Second Contact Pager: [phone]   Chief Complaint: leg pain  History of Present Illness: [name] is a 56 y.o. 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  Since she ran out of oxycodone 2-3 days ago and the pain in her leg wounds has gotten worse, and she has had increasing difficulties getting around at home because of the pain.  She reports home health is coming 2-3x per week and that she has not had troubles getting wound care supplies.  Also denies difficulty accessing food.  Denies fever, nausea/vomiting, diarrhea, abdominal pain, chest pain, dyspnea, headache, and any other symptoms.  She has had may prior presentations with similar complaints, most recently last week on 1/4.  At that time, her wounds were cleaned and bandaged, give some IVF, pain controlled, and she was discharged home to continue with home health wound care.  She also mentioned some chest pain at that time and was ruled out for ACS with EKG and negative troponins.   Meds:  No outpatient prescriptions have been marked as taking for the 10/14/16 encounter [facility] Encounter).   She says she takes "about 5" medicines every day, but cannot remember any names or what they are for other than "oxy".   Allergies: Allergies as of 10/14/2016 - Review Complete 10/14/2016  Allergen Reaction Noted  . Acetaminophen Swelling and Other (See Comments)   . Other  10/03/2016   Past  Medical History:  Diagnosis Date  . CAP (community acquired pneumonia) 03/2014 X 2  . Cocaine abuse    ongoing with resultant vaculitis.  . Depression   . Headache    "weekly" (07/29/2016)  . Hypertension   . Inflammatory arthritis   . Migraines    "probably 5-6/yr" (07/29/2016)  . Normocytic anemia    BL Hgb 9.8-12. Last anemia panel 04/2010 - showing Fe 19, ferritin 101.  Pt on monthly B12 injections  . Rheumatoid arthritis(714.0)    patient reported  . 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 Followed by Dr. Louanne Skye    Family History: Mother with breast cancer, mother and father with alcohol abuse  Social History:  Active cocaine abuse (last "a few days ago"), no alcohol or other drugs Smokes ~5 cigarettes daily Living in boarding house Denies difficulty affording or accessing food  Review of Systems: A complete ROS was negative except as per HPI.  Physical Exam: Blood pressure (!) 193/111, pulse (!) 112, temperature 99.1 F (37.3 C), temperature source Oral, resp. rate 19, height 5' 1"  (1.549 m), weight 98 lb (44.5 kg), SpO2 100 %.  Physical Exam  Constitutional: She is oriented to person, place, and time.  Cachectic woman sleeping in bed Wakes to voice, repeatedly falls asleep during interview  HENT:  Head: Normocephalic and atraumatic.  Mouth/Throat: Oropharynx is clear and moist.  Eyes: Conjunctivae are normal. No scleral icterus.  Neck: Normal range of motion. Neck supple.  Cardiovascular:  Tachycardic 2/6 systolic murmur Normal O5F2  Pulmonary/Chest: Effort normal and breath sounds normal. No respiratory distress.  Abdominal: Soft. She exhibits no distension. There is no tenderness.  Musculoskeletal:  Multiple large ulcers on bilateral lower extremities though subcutaneous fat to fascia (see photos) Largest involving over half of posterior right leg, also ~4 cm diameter over medial L ankle posterior to  malleolus No discharge or odor  Neurological: She is alert and oriented to person, place, and time.  Psychiatric:  Tearful               CBC Latest Ref Rng & Units 10/14/2016 10/08/2016 09/26/2016  WBC 4.0 - 10.5 K/uL 11.2(H) 7.3 6.1  Hemoglobin 12.0 - 15.0 g/dL 9.5(L) 10.5(L) 11.0(L)  Hematocrit 36.0 - 46.0 % 28.8(L) 31.8(L) 32.8(L)  Platelets 150 - 400 K/uL 343 328 386   CMP Latest Ref Rng & Units 10/14/2016 10/08/2016 09/26/2016  Glucose 65 - 99 mg/dL 94 111(H) 100(H)  BUN 6 - 20 mg/dL 17 31(H) 18  Creatinine 0.44 - 1.00 mg/dL 0.98 1.17(H) 0.94  Sodium 135 - 145 mmol/L 138 141 142  Potassium 3.5 - 5.1 mmol/L 4.1 3.7 3.9  Chloride 101 - 111 mmol/L 106 111 109  CO2 22 - 32 mmol/L 25 20(L) 25  Calcium 8.9 - 10.3 mg/dL 9.0 9.1 9.3  Total Protein 6.5 - 8.1 g/dL 6.1(L) - -  Total Bilirubin 0.3 - 1.2 mg/dL 0.7 - -  Alkaline Phos 38 - 126 U/L 69 - -  AST 15 - 41 U/L 17 - -  ALT 14 - 54 U/L 11(L) - -   Drugs of Abuse     Component Value Date/Time   LABOPIA NONE DETECTED 10/14/2016 0401   COCAINSCRNUR POSITIVE (A) 10/14/2016 0401   COCAINSCRNUR NEG 08/31/2012 1045   LABBENZ NONE DETECTED 10/14/2016 0401   LABBENZ NEGATIVE 06/12/2011 0221   AMPHETMU NONE DETECTED 10/14/2016 0401   THCU NONE DETECTED 10/14/2016 0401   LABBARB NONE DETECTED 10/14/2016 0401      Assessment & Plan by Problem: Active Problems:   Lower extremity ulceration ([place])   56 y.o. 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  Her pain has worsened after running out of opiates, and he wounds seem to be stable and not infected.  #Chronic Wounds #Levimasole Vasculitis #Cocaine Abuse -Counseled regarding risks of cocaine abuse and importance of cessation -Wound care consult -Oxy 5 Q6H, Tylenol, Ibuprofen for pain -She may require chronic opiates for  chronic wounds  #HTN #Tachycardia Prescribed lisinopril 20 mg at home, noncompliant. -Hydralazine 2 mg Q6H PRN for SBP >180, DBP >100  #Anemia Chronic, normocytic, baseline Hgb ~10.  Likely due to chronic inflammation  Fluids: none Diet: heart DVT Prophylaxis: lovenox Code Status: full  Dispo: Admit patient to Observation with expected length of stay less than 2 midnights.  Signed: Minus Liberty, MD 10/14/2016, 12:55 PM  Pager: [phone]

## 2016-10-14 NOTE — ED Notes (Signed)
Called report 6N Emergency planning/management officer

## 2016-10-14 NOTE — Progress Notes (Signed)
Pt arrived to floor from ED.  Pt oriented to room and call bell and phone and call bell within reach.  MD notified that pt has arrived.  [name]

## 2016-10-15 DIAGNOSIS — Z79891 Long term (current) use of opiate analgesic: Secondary | ICD-10-CM | POA: Diagnosis not present

## 2016-10-15 DIAGNOSIS — L958 Other vasculitis limited to the skin: Secondary | ICD-10-CM | POA: Diagnosis not present

## 2016-10-15 DIAGNOSIS — Z886 Allergy status to analgesic agent status: Secondary | ICD-10-CM | POA: Diagnosis not present

## 2016-10-15 DIAGNOSIS — Z79899 Other long term (current) drug therapy: Secondary | ICD-10-CM | POA: Diagnosis not present

## 2016-10-15 DIAGNOSIS — D891 Cryoglobulinemia: Secondary | ICD-10-CM | POA: Diagnosis present

## 2016-10-15 DIAGNOSIS — Z9889 Other specified postprocedural states: Secondary | ICD-10-CM | POA: Diagnosis not present

## 2016-10-15 DIAGNOSIS — I10 Essential (primary) hypertension: Secondary | ICD-10-CM | POA: Diagnosis present

## 2016-10-15 DIAGNOSIS — M318 Other specified necrotizing vasculopathies: Secondary | ICD-10-CM | POA: Diagnosis present

## 2016-10-15 DIAGNOSIS — Z9119 Patient's noncompliance with other medical treatment and regimen: Secondary | ICD-10-CM | POA: Diagnosis not present

## 2016-10-15 DIAGNOSIS — D649 Anemia, unspecified: Secondary | ICD-10-CM | POA: Diagnosis present

## 2016-10-15 DIAGNOSIS — F1420 Cocaine dependence, uncomplicated: Secondary | ICD-10-CM | POA: Diagnosis present

## 2016-10-15 DIAGNOSIS — L97929 Non-pressure chronic ulcer of unspecified part of left lower leg with unspecified severity: Secondary | ICD-10-CM | POA: Diagnosis present

## 2016-10-15 DIAGNOSIS — F17210 Nicotine dependence, cigarettes, uncomplicated: Secondary | ICD-10-CM | POA: Diagnosis not present

## 2016-10-15 DIAGNOSIS — Z9114 Patient's other noncompliance with medication regimen: Secondary | ICD-10-CM | POA: Diagnosis not present

## 2016-10-15 DIAGNOSIS — F14288 Cocaine dependence with other cocaine-induced disorder: Secondary | ICD-10-CM | POA: Diagnosis not present

## 2016-10-15 DIAGNOSIS — L97919 Non-pressure chronic ulcer of unspecified part of right lower leg with unspecified severity: Secondary | ICD-10-CM | POA: Diagnosis present

## 2016-10-15 DIAGNOSIS — R000 Tachycardia, unspecified: Secondary | ICD-10-CM | POA: Diagnosis present

## 2016-10-15 DIAGNOSIS — I776 Arteritis, unspecified: Secondary | ICD-10-CM | POA: Diagnosis present

## 2016-10-15 LAB — BASIC METABOLIC PANEL
Anion gap: 9 (ref 5–15)
BUN: 17 mg/dL (ref 6–20)
CO2: 23 mmol/L (ref 22–32)
Calcium: 9.1 mg/dL (ref 8.9–10.3)
Chloride: 105 mmol/L (ref 101–111)
Creatinine, Ser: 1.24 mg/dL — ABNORMAL HIGH (ref 0.44–1.00)
GFR calc Af Amer: 56 mL/min — ABNORMAL LOW (ref >60)
GFR calc non Af Amer: 49 mL/min — ABNORMAL LOW (ref >60)
Glucose, Bld: 155 mg/dL — ABNORMAL HIGH (ref 65–99)
Potassium: 4 mmol/L (ref 3.5–5.1)
Sodium: 137 mmol/L (ref 135–145)

## 2016-10-15 LAB — CBC
HCT: 34.1 % — ABNORMAL LOW (ref 36.0–46.0)
Hemoglobin: 11.2 g/dL — ABNORMAL LOW (ref 12.0–15.0)
MCH: 27.4 pg (ref 26.0–34.0)
MCHC: 32.8 g/dL (ref 30.0–36.0)
MCV: 83.4 fL (ref 78.0–100.0)
Platelets: 440 10*3/uL — ABNORMAL HIGH (ref 150–400)
RBC: 4.09 MIL/uL (ref 3.87–5.11)
RDW: 14.2 % (ref 11.5–15.5)
WBC: 10 10*3/uL (ref 4.0–10.5)

## 2016-10-15 MED ORDER — MORPHINE SULFATE (PF) 2 MG/ML IV SOLN
2.0000 mg | Freq: Every day | INTRAVENOUS | Status: DC | PRN
  Administered 2016-10-15 – 2016-10-19 (×5): 2 mg via INTRAVENOUS
  Filled 2016-10-15 (×5): qty 1

## 2016-10-15 MED ORDER — OXYCODONE HCL 5 MG PO TABS
5.0000 mg | ORAL_TABLET | ORAL | Status: DC | PRN
  Administered 2016-10-15 – 2016-10-21 (×30): 5 mg via ORAL
  Filled 2016-10-15 (×31): qty 1

## 2016-10-15 MED ORDER — COLLAGENASE 250 UNIT/GM EX OINT
TOPICAL_OINTMENT | Freq: Every day | CUTANEOUS | Status: DC
  Administered 2016-10-15 – 2016-10-24 (×6): via TOPICAL
  Filled 2016-10-15: qty 30

## 2016-10-15 MED ORDER — SODIUM CHLORIDE 0.9 % IV SOLN
INTRAVENOUS | Status: AC
  Administered 2016-10-15: 12:00:00 via INTRAVENOUS
  Filled 2016-10-15 (×2): qty 1000

## 2016-10-15 NOTE — Progress Notes (Signed)
Pt. Crying in pain. MD PAGED. New orders placed.

## 2016-10-15 NOTE — Progress Notes (Addendum)
Dressing change done on right leg with xeroform, abd pads, and kerlex. Per MD order. Wrapped leg with 1 kerlex and pt. Refused wrapping anymore. Dressing change done on left leg by [place] nurse.

## 2016-10-15 NOTE — Progress Notes (Signed)
   Subjective: Still complaining of severe pain in her legs despite oxycodone.  Able to eat and drink.  Wounds evaluated and bandaged by [facility].  Objective:  Vital signs in last 24 hours: Vitals:   10/14/16 1121 10/14/16 1600 10/14/16 2031 10/15/16 0427  BP: (!) 193/111 (!) 154/87 123/77 131/74  Pulse: (!) 112 (!) 114 (!) 118 (!) 107  Resp: 19 18 17 17   Temp: 99.1 F (37.3 C) 99.9 F (37.7 C) (!) 100.8 F (38.2 C) 98.6 F (37 C)  TempSrc: Oral Oral Oral   SpO2: 100% 100% 100% 100%  Weight:      Height:       Physical Exam  Constitutional: She is oriented to person, place, and time.  Cachectic, tearful, in no acute distress  Cardiovascular: Normal rate and regular rhythm.   Pulmonary/Chest: Effort normal and breath sounds normal.  Musculoskeletal:  Bilateral lower legs covered in fresh clean bandages  Neurological: She is alert and oriented to person, place, and time.  Psychiatric: She has a normal mood and affect. Her behavior is normal.    Assessment/Plan:  Principal Problem:   Ulcer of lower extremity with fat layer exposed ([place]) Active Problems:   Essential hypertension   Cocaine-induced vascular disorder (HCC)   Cocaine use disorder, severe, dependence (HCC)   Lower extremity ulceration ([place])  56 y.o. 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  Her pain has worsened after running out of opiates, and he wounds seem to be stable and not infected.  She is not a surgical candidate with ongoing cocaine abuse, pain control is current treatment goal.  #Chronic Wounds #Levimasole Vasculitis #Cocaine Abuse -Counseled regarding risks of cocaine abuse and importance of cessation -Wound care consult -Oxy 5 Q4H, Tylenol, Ibuprofen for pain -She may require chronic opiates for chronic  wounds  #HTN #Tachycardia Prescribed lisinopril 20 mg at home, noncompliant. -Hydralazine 2 mg Q6H PRN for SBP >180, DBP >100  Fluids: none Diet: heart DVT Prophylaxis: lovenox Code Status: full  Dispo: Anticipated discharge in approximately 1-2 day(s).   Minus Liberty, MD 10/15/2016, 9:04 AM Pager: [phone]

## 2016-10-15 NOTE — Consult Note (Signed)
[facility] Nurse wound consult note Patient well known to this [place] from her previous multiple admissions.  Reason for Consult: multiple ulcers; levamisole vasculitis  Wound type: full thickness ulcerations with necrosis Measurement: Right posterior calf 12cm x 5cm x 1.0cm; 25% necrotic/75% pink but not healthy, unable to visualize tendon but fearful of exposure with current depth Right great toe: 2cm x 3.5cm x 0.1cm 100% pale pink, clean Left great toe/plantar surface: 1cm x 3cm x 0.2cm; 10% black/90% pink but not healthy tissue Left malleolus: 3.5cm x 2.5cm x 0.2cm ;50% yellow/10% black at wound edges/40% pink, ruddy  Left medial malleolus:5cm x 5cm x 0.1cm; 100% clean:  Wound bed:see above  Drainage (amount, consistency, odor) moderate, yellow/brown drainage from all wounds. Odor noted mostly from wounds that area necrotic Periwound: intact with evidence of scarring over the legs from previous wounds Dressing procedure/placement/frequency: Add enzymatic debridement ointment to the left lateral malleolus Continue xeroform to all other wounds at this time Contacted attending MD about ortho to re-eval for amputation of the RLE, discussed with patient Patient consents to rehab for drug addiction.  Discussed POC with patient and bedside nurse.  Re consult if needed, will not follow at this time. Thanks  Orlene Salmons [employer], RN,CWOCN, CNS [phone])

## 2016-10-16 ENCOUNTER — Encounter (HOSPITAL_COMMUNITY): Payer: Self-pay | Admitting: *Deleted

## 2016-10-16 NOTE — Progress Notes (Signed)
   Subjective: No acute events overnight.  Leg pain still severe, R > L.   Would be interested in amputation if it is an option because she feels she can't live with this pain any more.  Objective:  Vital signs in last 24 hours: Vitals:   10/15/16 0427 10/15/16 1411 10/15/16 2030 10/16/16 0646  BP: 131/74 101/63 95/65 105/64  Pulse: (!) 107 (!) 105 (!) 113 (!) 104  Resp: 17 18 19 18   Temp: 98.6 F (37 C) 98.8 F (37.1 C) 98.4 F (36.9 C) 98.7 F (37.1 C)  TempSrc:  Oral Oral   SpO2: 100% 100% 100% 98%  Weight:      Height:       Physical Exam  Constitutional: She is oriented to person, place, and time.  Cachetic woman lying comfortably in bed, in no acute distress  Cardiovascular: Normal rate and regular rhythm.   Pulmonary/Chest: Effort normal and breath sounds normal.  Musculoskeletal:  Bilateral lower legs bandaged, some bloody discharge over L posterior lateral ankle  Neurological: She is alert and oriented to person, place, and time.  Psychiatric: She has a normal mood and affect. Her behavior is normal.   CBC Latest Ref Rng & Units 10/15/2016 10/14/2016 10/08/2016  WBC 4.0 - 10.5 K/uL 10.0 11.2(H) 7.3  Hemoglobin 12.0 - 15.0 g/dL 11.2(L) 9.5(L) 10.5(L)  Hematocrit 36.0 - 46.0 % 34.1(L) 28.8(L) 31.8(L)  Platelets 150 - 400 K/uL 440(H) 343 328   BMP Latest Ref Rng & Units 10/15/2016 10/14/2016 10/08/2016  Glucose 65 - 99 mg/dL 155(H) 94 111(H)  BUN 6 - 20 mg/dL 17 17 31(H)  Creatinine 0.44 - 1.00 mg/dL 1.24(H) 0.98 1.17(H)  BUN/Creat Ratio 9 - 23 - - -  Sodium 135 - 145 mmol/L 137 138 141  Potassium 3.5 - 5.1 mmol/L 4.0 4.1 3.7  Chloride 101 - 111 mmol/L 105 106 111  CO2 22 - 32 mmol/L 23 25 20(L)  Calcium 8.9 - 10.3 mg/dL 9.1 9.0 9.1    Assessment/Plan:  Principal Problem:   Ulcer of lower extremity with fat layer exposed ([place]) Active Problems:   Essential hypertension   Cocaine-induced vascular disorder (HCC)   Cocaine use disorder, severe, dependence (HCC)  Lower extremity ulceration ([place])  56 y.o. 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  Her pain has worsened after running out of opiates, and he wounds seem to be stable and not infected.  She has been deemed not a surgical candidate by orthopedics previously, will attempt to control pain and consult podiatry for surgical evaluation.  #Chronic Wounds #Levimasole Vasculitis #Cocaine Abuse -Counseled regarding risks of cocaine abuse and importance of cessation -Wound care -Oxy 5 Q4H, Tylenol, Ibuprofen for pain -She may require chronic opiates for chronic wounds at discharge -Podiatry consult  #HTN #Tachycardia Prescribed lisinopril 20 mg at home, noncompliant. -Lisinopril 20 mg daily -Hydralazine 2 mg Q6H PRN for SBP >180, DBP >100  Fluids: none Diet: heart DVT Prophylaxis: lovenox Code Status: full  Dispo: Anticipated discharge pending surgical evaluation.   Minus Liberty, MD 10/16/2016, 7:04 AM Pager: [phone]

## 2016-10-17 DIAGNOSIS — L03039 Cellulitis of unspecified toe: Secondary | ICD-10-CM

## 2016-10-17 NOTE — Progress Notes (Signed)
   Subjective: No acute events overnight.  Seen by podiatry this AM.  Agreeable to debridement as an option.  Pain appears adequately controlled at this time.  Objective:  Vital signs in last 24 hours: Vitals:   10/16/16 0646 10/16/16 1347 10/16/16 2138 10/17/16 0500  BP: 105/64 123/66 (!) 128/59 122/68  Pulse: (!) 104 (!) 105 99 73  Resp: 18 18 18 18   Temp: 98.7 F (37.1 C) 98.2 F (36.8 C) 99 F (37.2 C) 98.2 F (36.8 C)  TempSrc:  Oral Oral Oral  SpO2: 98% 100% 98% 92%  Weight:      Height:       Physical Exam  Constitutional: She is oriented to person, place, and time.  Cachetic woman lying comfortably in bed, in no acute distress  Pulmonary/Chest: Effort normal.  Musculoskeletal:  Bilateral lower legs bandaged.  Neurological: She is alert and oriented to person, place, and time. No cranial nerve deficit.  Psychiatric: She has a normal mood and affect. Her behavior is normal.   CBC Latest Ref Rng & Units 10/15/2016 10/14/2016 10/08/2016  WBC 4.0 - 10.5 K/uL 10.0 11.2(H) 7.3  Hemoglobin 12.0 - 15.0 g/dL 11.2(L) 9.5(L) 10.5(L)  Hematocrit 36.0 - 46.0 % 34.1(L) 28.8(L) 31.8(L)  Platelets 150 - 400 K/uL 440(H) 343 328   BMP Latest Ref Rng & Units 10/15/2016 10/14/2016 10/08/2016  Glucose 65 - 99 mg/dL 155(H) 94 111(H)  BUN 6 - 20 mg/dL 17 17 31(H)  Creatinine 0.44 - 1.00 mg/dL 1.24(H) 0.98 1.17(H)  BUN/Creat Ratio 9 - 23 - - -  Sodium 135 - 145 mmol/L 137 138 141  Potassium 3.5 - 5.1 mmol/L 4.0 4.1 3.7  Chloride 101 - 111 mmol/L 105 106 111  CO2 22 - 32 mmol/L 23 25 20(L)  Calcium 8.9 - 10.3 mg/dL 9.1 9.0 9.1    Assessment/Plan:  Principal Problem:   Ulcer of lower extremity with fat layer exposed ([place]) Active Problems:   Essential hypertension   Cocaine-induced vascular disorder (HCC)   Cocaine use disorder, severe, dependence (HCC)   Lower extremity ulceration ([place])  56 y.o. female with history of cocaine abuse, cocaine-associated (levimasole) vasculitis, and  chronic lower extremity wounds due to the aforementioned who presents with worsening pain in her chronic wounds.  Her pain has worsened after running out of opiates, and he wounds seem to be stable and not infected.  She has been deemed not a surgical candidate by orthopedics previously, will attempt to control pain and consult podiatry for surgical evaluation.  #Chronic Wounds #Levimasole Vasculitis #Cocaine Abuse -Counseled regarding risks of cocaine abuse and importance of cessation -Wound care -Oxy 5 Q4H, Tylenol, Ibuprofen for pain -Has once per day morphine PRN for dressing changes -Miralax PRN -She may require chronic opiates for chronic wounds at discharge -Podiatry consult appreciated.  Recommend debridement early next week  #HTN #Tachycardia - resolved Prescribed lisinopril 20 mg at home, noncompliant. -Lisinopril 20 mg daily  Fluids: none Diet: heart DVT Prophylaxis: lovenox Code Status: full  Dispo: Anticipated discharge pending surgical evaluation.  2-3 days.  Jule Ser, DO 10/17/2016, 10:35 AM Pager: [phone]

## 2016-10-17 NOTE — Consult Note (Addendum)
Reason for consult : bilateral lower extremity ulcerations  Subjective:  56 yr old female with a history of cocaine abuse and cocaine associated vasulitis presents as inpatient for evaluation ulcerations to the bilateral lower extremities. Patient is in significant pain regarding the ulcerations. She states that she has had the ulcerations for several years. She states that she has been managing her ulcerations herself at home prior to admission on 10/14/16.   Patient Active Problem List   Diagnosis Date Noted  . Ulcer of lower extremity with fat layer exposed ([place]) 10/14/2016  . Lower extremity ulceration ([place]) 10/14/2016  . Acute kidney injury ([place]) 09/09/2016  . Palliative care encounter   . Wound drainage   . Infectious myositis   . Wound dehiscence   . Finger necrosis ([place]) 05/18/2016  . AKI (acute kidney injury) ([place])   . Leg ulcer ([place])   . 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 ([place]) 06/04/2015  . Pressure ulcer 04/22/2015  . Skin necrosis ([place]) 04/21/2015  . Protein-calorie malnutrition, severe ([place]) 01/15/2015  . MDD (major depressive disorder), recurrent episode, severe ([place]) 10/16/2014  . Cocaine use disorder, severe, dependence ([place]) 10/10/2014  . Cannabis use disorder, severe, dependence ([place]) 10/10/2014  . Substance induced mood disorder ([place]) 10/09/2014  . 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 Hepatomegaly 04/11/2014  . Acute pericarditis 03/31/2014  . Inflammatory arthritis 09/24/2013  . Cocaine-induced vascular disorder ([place]) 06/19/2013  . Tobacco abuse 01/03/2013  . hyperlipidemia 01/03/2013  . Essential hypertension 02/26/2010    Objective/Physical Exam  BMP Latest Ref Rng & Units 10/15/2016 10/14/2016 10/08/2016  Glucose 65 - 99 mg/dL 155(H) 94 111(H)  BUN 6 - 20 mg/dL 17 17 31(H)  Creatinine 0.44 - 1.00 mg/dL 1.24(H) 0.98 1.17(H)  BUN/Creat Ratio 9 - 23 - - -  Sodium 135 - 145 mmol/L 137 138 141  Potassium 3.5 - 5.1 mmol/L 4.0 4.1 3.7  Chloride 101 -  111 mmol/L 105 106 111  CO2 22 - 32 mmol/L 23 25 20(L)  Calcium 8.9 - 10.3 mg/dL 9.1 9.0 9.1    Dermatology:  Wound #1 noted to the lateral aspect of the ankle measuring 4.0x4.0x0.5 cm (LxWxD).   Wound #2 noted to the medial aspect of the ankle measuring 5.0x5.0x0.5 cm (LxWxD).   Wound #3 noted to the dorsal aspect of the left foot 1st MTP joint measuring 3.0x4.0x0.1cm (LxWxD).   Wound #4 noted to the right posterior ankle measuring approximately 20.0x7.0x0.5 cm (LxWxD).   To the noted ulceration(s), there is no overlying eschar. There is an extensive amount of slough, fibrin, and necrotic tissue noted to wound #1, remaining wound are mostly granular. Granulation tissue and wound base is red. There is a moderate serosanguineous drainage noted. There is exposed muscle-tendon ligament. There appears to be no exposed bone or joint at the moment. There is no distinct malodor. Periwound integrity is intact.  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 The patient would likely benefit from a surgical debridement of all wounds due to inability to mechanically debride at bedside due to pain. We will plan for surgical debridement beginning of next week. At the moment, continue daily dressing change orders provided by wound care consisting of santyl and xeroform. Podiatry will follow while inpatient.   Thank you for the consultation. Any questions regarding care please contact me directly [phone].  Edrick Kins, DPM [facility]  Dr. Edrick Kins, [address]                                        [place], [place] [address]                Office [phone]  Fax [phone]

## 2016-10-18 LAB — BASIC METABOLIC PANEL
ANION GAP: 7 (ref 5–15)
BUN: 21 mg/dL — ABNORMAL HIGH (ref 6–20)
CALCIUM: 8.8 mg/dL — AB (ref 8.9–10.3)
CHLORIDE: 108 mmol/L (ref 101–111)
CO2: 24 mmol/L (ref 22–32)
Creatinine, Ser: 0.98 mg/dL (ref 0.44–1.00)
GFR calc non Af Amer: >60 mL/min (ref >60)
Glucose, Bld: 97 mg/dL (ref 65–99)
Potassium: 4 mmol/L (ref 3.5–5.1)
SODIUM: 139 mmol/L (ref 135–145)

## 2016-10-18 NOTE — Progress Notes (Signed)
   Subjective: Continues to have bad pain in both her legs which she says is not improved by oxycodone.  No fevers, chills, chest pain, dyspnea.  Reports eating and drinking well.  Understands plan for surgical debridement tomorrow or Tues.  Objective:  Vital signs in last 24 hours: Vitals:   10/17/16 0500 10/17/16 1216 10/17/16 2033 10/18/16 0702  BP: 122/68 121/64 140/76 136/88  Pulse: 73 96 (!) 107 83  Resp: 18 18 18 17   Temp: 98.2 F (36.8 C) 99 F (37.2 C) 98.6 F (37 C) 98.6 F (37 C)  TempSrc: Oral Oral Oral Oral  SpO2: 92% 100% 99% 100%  Weight:      Height:       Physical Exam  Constitutional: She is oriented to person, place, and time.  Cachetic woman lying comfortably in bed, in no acute distress  Cardiovascular: Normal rate and regular rhythm.   Pulmonary/Chest: Effort normal and breath sounds normal.  Musculoskeletal:  Wounds unchanged from admission Pink based ulcers through subcutaneous fat No odor, purulent drainage, conspicuous undermining, or surrounds erythema  Neurological: She is alert and oriented to person, place, and time.  Psychiatric: She has a normal mood and affect. Her behavior is normal.   CBC Latest Ref Rng & Units 10/15/2016 10/14/2016 10/08/2016  WBC 4.0 - 10.5 K/uL 10.0 11.2(H) 7.3  Hemoglobin 12.0 - 15.0 g/dL 11.2(L) 9.5(L) 10.5(L)  Hematocrit 36.0 - 46.0 % 34.1(L) 28.8(L) 31.8(L)  Platelets 150 - 400 K/uL 440(H) 343 328   BMP Latest Ref Rng & Units 10/18/2016 10/15/2016 10/14/2016  Glucose 65 - 99 mg/dL 97 155(H) 94  BUN 6 - 20 mg/dL 21(H) 17 17  Creatinine 0.44 - 1.00 mg/dL 0.98 1.24(H) 0.98  BUN/Creat Ratio 9 - 23 - - -  Sodium 135 - 145 mmol/L 139 137 138  Potassium 3.5 - 5.1 mmol/L 4.0 4.0 4.1  Chloride 101 - 111 mmol/L 108 105 106  CO2 22 - 32 mmol/L 24 23 25   Calcium 8.9 - 10.3 mg/dL 8.8(L) 9.1 9.0    Assessment/Plan:  Principal Problem:   Ulcer of lower extremity with fat layer exposed (HCC) Active Problems:   Essential  hypertension   Cocaine-induced vascular disorder (HCC)   Cocaine use disorder, severe, dependence (HCC)   Lower extremity ulceration ([place])  56 y.o. 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  Her pain has worsened after running out of opiates, and he wounds seem to be stable and not infected.  Plan for surgical debridement by Podiatry early next week.  #Chronic Wounds #Levimasole Vasculitis #Cocaine Abuse -Counseled regarding risks of cocaine abuse and importance of cessation -Wound care, daily dressing changes -Oxy 5 Q4H PRN, Ibuprofen for pain -IV morphine with dressing changes -She may require chronic opiates for chronic wounds at discharge -Podiatry following, Dr Amalia Hailey planning for surgical debridement early week of 1/15  #HTN #Tachycardia Prescribed lisinopril 20 mg at home, noncompliant. -Lisinopril 20 mg daily -Hydralazine 2 mg Q6H PRN for SBP >180, DBP >100  Fluids: none Diet: heart DVT Prophylaxis: lovenox Code Status: full  Dispo: Anticipated discharge 2-3 days.   Minus Liberty, MD 10/18/2016, 7:09 AM Pager: [phone]

## 2016-10-19 MED ORDER — CEFAZOLIN SODIUM-DEXTROSE 2-4 GM/100ML-% IV SOLN
2.0000 g | INTRAVENOUS | Status: AC
  Administered 2016-10-20: 2 g via INTRAVENOUS
  Filled 2016-10-19: qty 100

## 2016-10-19 MED ORDER — POVIDONE-IODINE 7.5 % EX SOLN
Freq: Once | CUTANEOUS | Status: DC
  Filled 2016-10-19: qty 118

## 2016-10-19 NOTE — Progress Notes (Signed)
   Subjective: No acute events overnight.  Pain in legs continues.  Eating and drinking ok.  Objective:  Vital signs in last 24 hours: Vitals:   10/18/16 0702 10/18/16 1350 10/18/16 2025 10/19/16 0443  BP: 136/88 127/65 (!) 143/68 (!) 124/58  Pulse: 83 98 (!) 102 80  Resp: 17  18 17   Temp: 98.6 F (37 C) 98.8 F (37.1 C) 99.5 F (37.5 C) 98.4 F (36.9 C)  TempSrc: Oral Oral Oral Oral  SpO2: 100% 100% 99% 100%  Weight:      Height:       Physical Exam  Constitutional: She is oriented to person, place, and time.  Cachetic woman lying comfortably in bed, in no acute distress  Cardiovascular: Normal rate and regular rhythm.   Pulmonary/Chest: Effort normal and breath sounds normal.  Musculoskeletal:  Bilateral lower legs covered with clean bandages  Neurological: She is alert and oriented to person, place, and time.  Psychiatric: She has a normal mood and affect. Her behavior is normal.   CBC Latest Ref Rng & Units 10/15/2016 10/14/2016 10/08/2016  WBC 4.0 - 10.5 K/uL 10.0 11.2(H) 7.3  Hemoglobin 12.0 - 15.0 g/dL 11.2(L) 9.5(L) 10.5(L)  Hematocrit 36.0 - 46.0 % 34.1(L) 28.8(L) 31.8(L)  Platelets 150 - 400 K/uL 440(H) 343 328   BMP Latest Ref Rng & Units 10/18/2016 10/15/2016 10/14/2016  Glucose 65 - 99 mg/dL 97 155(H) 94  BUN 6 - 20 mg/dL 21(H) 17 17  Creatinine 0.44 - 1.00 mg/dL 0.98 1.24(H) 0.98  BUN/Creat Ratio 9 - 23 - - -  Sodium 135 - 145 mmol/L 139 137 138  Potassium 3.5 - 5.1 mmol/L 4.0 4.0 4.1  Chloride 101 - 111 mmol/L 108 105 106  CO2 22 - 32 mmol/L 24 23 25   Calcium 8.9 - 10.3 mg/dL 8.8(L) 9.1 9.0    Assessment/Plan:  Principal Problem:   Ulcer of lower extremity with fat layer exposed (HCC) Active Problems:   Essential hypertension   Cocaine-induced vascular disorder (HCC)   Cocaine use disorder, severe, dependence (HCC)   Lower extremity ulceration ([place])  56 y.o. female with history of cocaine abuse, cocaine-associated (levimasole) vasculitis, and chronic  lower extremity wounds due to the aforementioned who presents with worsening pain in her chronic wounds.  Her pain has worsened after running out of opiates, and he wounds seem to be stable and not infected.  Plan for surgical debridement by Podiatry early this week.  #Chronic Wounds #Levimasole Vasculitis #Cocaine Abuse -Counseled regarding risks of cocaine abuse and importance of cessation -Wound care, daily dressing changes -Oxy 5 Q4H PRN, Ibuprofen for pain -IV morphine with dressing changes -She may require chronic opiates for chronic wounds at discharge -Podiatry following, Dr Amalia Hailey planning for surgical debridement early this week #HTN #Tachycardia Prescribed lisinopril 20 mg at home, noncompliant. -Lisinopril 20 mg daily -Hydralazine 2 mg Q6H PRN for SBP >180, DBP >100  Fluids: none Diet: heart; NPO at midnight DVT Prophylaxis: lovenox Code Status: full  Dispo: Anticipated discharge 2-3 days.   Minus Liberty, MD 10/19/2016, 11:01 AM Pager: [phone]

## 2016-10-20 ENCOUNTER — Inpatient Hospital Stay (HOSPITAL_COMMUNITY): Payer: Medicaid Other | Admitting: Certified Registered Nurse Anesthetist

## 2016-10-20 ENCOUNTER — Encounter (HOSPITAL_COMMUNITY)
Admission: EM | Disposition: A | Discharge status: Home Health | Payer: Self-pay | Source: Home / Self Care | Attending: Oncology

## 2016-10-20 ENCOUNTER — Encounter (HOSPITAL_COMMUNITY): Payer: Self-pay | Admitting: General Practice

## 2016-10-20 DIAGNOSIS — F17210 Nicotine dependence, cigarettes, uncomplicated: Secondary | ICD-10-CM

## 2016-10-20 DIAGNOSIS — D649 Anemia, unspecified: Secondary | ICD-10-CM

## 2016-10-20 DIAGNOSIS — L97522 Non-pressure chronic ulcer of other part of left foot with fat layer exposed: Secondary | ICD-10-CM

## 2016-10-20 DIAGNOSIS — Z79891 Long term (current) use of opiate analgesic: Secondary | ICD-10-CM

## 2016-10-20 HISTORY — PX: INCISION AND DRAINAGE OF WOUND: SHX1803

## 2016-10-20 SURGERY — IRRIGATION AND DEBRIDEMENT WOUND
Anesthesia: General | Site: Foot | Laterality: Bilateral

## 2016-10-20 MED ORDER — PHENYLEPHRINE 40 MCG/ML (10ML) SYRINGE FOR IV PUSH (FOR BLOOD PRESSURE SUPPORT)
PREFILLED_SYRINGE | INTRAVENOUS | Status: AC
  Filled 2016-10-20: qty 20

## 2016-10-20 MED ORDER — HYDROMORPHONE HCL 1 MG/ML IJ SOLN
INTRAMUSCULAR | Status: AC
  Filled 2016-10-20: qty 0.5

## 2016-10-20 MED ORDER — FENTANYL CITRATE (PF) 100 MCG/2ML IJ SOLN
INTRAMUSCULAR | Status: DC | PRN
  Administered 2016-10-20 (×2): 50 ug via INTRAVENOUS

## 2016-10-20 MED ORDER — LACTATED RINGERS IV SOLN
INTRAVENOUS | Status: DC | PRN
  Administered 2016-10-20 (×2): via INTRAVENOUS

## 2016-10-20 MED ORDER — SUFENTANIL CITRATE 50 MCG/ML IV SOLN
INTRAVENOUS | Status: AC
  Filled 2016-10-20: qty 1

## 2016-10-20 MED ORDER — 0.9 % SODIUM CHLORIDE (POUR BTL) OPTIME
TOPICAL | Status: DC | PRN
  Administered 2016-10-20: 1000 mL

## 2016-10-20 MED ORDER — BUPIVACAINE HCL (PF) 0.5 % IJ SOLN
INTRAMUSCULAR | Status: AC
  Filled 2016-10-20: qty 30

## 2016-10-20 MED ORDER — OXYCODONE HCL 5 MG PO TABS: 5.0000 mg | ORAL_TABLET | Freq: Once | ORAL | Status: DC | PRN

## 2016-10-20 MED ORDER — PROPOFOL 10 MG/ML IV BOLUS
INTRAVENOUS | Status: DC | PRN
  Administered 2016-10-20: 130 mg via INTRAVENOUS
  Administered 2016-10-20: 70 mg via INTRAVENOUS

## 2016-10-20 MED ORDER — LABETALOL HCL 5 MG/ML IV SOLN
INTRAVENOUS | Status: DC | PRN
  Administered 2016-10-20 (×2): 5 mg via INTRAVENOUS
  Administered 2016-10-20: 10 mg via INTRAVENOUS

## 2016-10-20 MED ORDER — SUFENTANIL CITRATE 50 MCG/ML IV SOLN
INTRAVENOUS | Status: DC | PRN
  Administered 2016-10-20: 5 ug via INTRAVENOUS

## 2016-10-20 MED ORDER — OXYCODONE HCL 5 MG/5ML PO SOLN
5.0000 mg | Freq: Once | ORAL | Status: DC | PRN
Start: 2016-10-20 — End: 2016-10-20

## 2016-10-20 MED ORDER — ONDANSETRON HCL 4 MG/2ML IJ SOLN: 4.0000 mg | Freq: Four times a day (QID) | INTRAMUSCULAR | Status: DC | PRN

## 2016-10-20 MED ORDER — LIDOCAINE HCL 2 % IJ SOLN
INTRAMUSCULAR | Status: AC
  Filled 2016-10-20: qty 20

## 2016-10-20 MED ORDER — LIDOCAINE HCL (CARDIAC) 20 MG/ML IV SOLN
INTRAVENOUS | Status: DC | PRN
  Administered 2016-10-20: 100 mg via INTRAVENOUS

## 2016-10-20 MED ORDER — ONDANSETRON HCL 4 MG/2ML IJ SOLN
INTRAMUSCULAR | Status: DC | PRN
  Administered 2016-10-20: 4 mg via INTRAVENOUS

## 2016-10-20 MED ORDER — HYDROMORPHONE HCL 1 MG/ML IJ SOLN
0.2500 mg | INTRAMUSCULAR | Status: DC | PRN
  Administered 2016-10-20 (×2): 0.5 mg via INTRAVENOUS

## 2016-10-20 MED ORDER — MIDAZOLAM HCL 5 MG/5ML IJ SOLN
INTRAMUSCULAR | Status: DC | PRN
  Administered 2016-10-20: 2 mg via INTRAVENOUS

## 2016-10-20 MED ORDER — BUPIVACAINE HCL (PF) 0.5 % IJ SOLN
INTRAMUSCULAR | Status: DC | PRN
Start: 2016-10-20 — End: 2016-10-20
  Administered 2016-10-20: 30 mL

## 2016-10-20 SURGICAL SUPPLY — 44 items
2% LIDOCAINE 20 ML IMPLANT
BANDAGE ACE 4X5 VEL STRL LF (GAUZE/BANDAGES/DRESSINGS) IMPLANT
BLADE SURG 15 STRL LF DISP TIS (BLADE) ×3 IMPLANT
BLADE SURG 15 STRL SS (BLADE) ×6
BNDG COHESIVE 4X5 TAN STRL (GAUZE/BANDAGES/DRESSINGS) ×2 IMPLANT
BNDG COHESIVE 6X5 TAN STRL LF (GAUZE/BANDAGES/DRESSINGS) IMPLANT
BNDG GAUZE ELAST 4 BULKY (GAUZE/BANDAGES/DRESSINGS) ×4 IMPLANT
BOWL SMART MIX CTS (DISPOSABLE) IMPLANT
CHLORAPREP W/TINT 26ML (MISCELLANEOUS) ×1 IMPLANT
COVER SURGICAL LIGHT HANDLE (MISCELLANEOUS) ×2 IMPLANT
CUFF TOURNIQUET SINGLE 34IN LL (TOURNIQUET CUFF) ×1 IMPLANT
DRAPE U-SHAPE 47X51 STRL (DRAPES) ×2 IMPLANT
DRSG PAD ABDOMINAL 8X10 ST (GAUZE/BANDAGES/DRESSINGS) ×1 IMPLANT
ELECT REM PT RETURN 9FT ADLT (ELECTROSURGICAL) ×2
ELECTRODE REM PT RTRN 9FT ADLT (ELECTROSURGICAL) IMPLANT
GAUZE SPONGE 4X4 12PLY STRL (GAUZE/BANDAGES/DRESSINGS) ×1 IMPLANT
GAUZE XEROFORM 1X8 LF (GAUZE/BANDAGES/DRESSINGS) ×1 IMPLANT
GLOVE BIOGEL PI IND STRL 8 (GLOVE) ×1 IMPLANT
GLOVE BIOGEL PI INDICATOR 8 (GLOVE) ×1
GLOVE SURG ORTHO 8.0 STRL STRW (GLOVE) ×2 IMPLANT
GOWN STRL REUS W/ TWL LRG LVL3 (GOWN DISPOSABLE) ×2 IMPLANT
GOWN STRL REUS W/TWL LRG LVL3 (GOWN DISPOSABLE) ×4
HANDPIECE INTERPULSE COAX TIP (DISPOSABLE)
KIT BASIN OR (CUSTOM PROCEDURE TRAY) ×2 IMPLANT
KIT ROOM TURNOVER OR (KITS) ×2 IMPLANT
MANIFOLD NEPTUNE II (INSTRUMENTS) ×2 IMPLANT
NDL HYPO 25GX1X1/2 BEV (NEEDLE) ×1 IMPLANT
NEEDLE HYPO 25GX1X1/2 BEV (NEEDLE) ×2 IMPLANT
NS IRRIG 1000ML POUR BTL (IV SOLUTION) ×2 IMPLANT
PACK ORTHO EXTREMITY (CUSTOM PROCEDURE TRAY) ×2 IMPLANT
PAD ARMBOARD 7.5X6 YLW CONV (MISCELLANEOUS) ×4 IMPLANT
PADDING CAST COTTON 6X4 STRL (CAST SUPPLIES) ×1 IMPLANT
SCRUB BETADINE 4OZ XXX (MISCELLANEOUS) ×2 IMPLANT
SET HNDPC FAN SPRY TIP SCT (DISPOSABLE) IMPLANT
SOLUTION BETADINE 4OZ (MISCELLANEOUS) ×2 IMPLANT
SPONGE LAP 18X18 X RAY DECT (DISPOSABLE) ×1 IMPLANT
STAPLER VISISTAT 35W (STAPLE) ×1 IMPLANT
STOCKINETTE IMPERVIOUS 9X36 MD (GAUZE/BANDAGES/DRESSINGS) ×3 IMPLANT
SUT PROLENE 3 0 PS 2 (SUTURE) ×1 IMPLANT
SYR CONTROL 10ML LL (SYRINGE) ×2 IMPLANT
TOWEL OR 17X24 6PK STRL BLUE (TOWEL DISPOSABLE) ×2 IMPLANT
TOWEL OR 17X26 10 PK STRL BLUE (TOWEL DISPOSABLE) ×2 IMPLANT
TUBE CONNECTING 12X1/4 (SUCTIONS) ×2 IMPLANT
YANKAUER SUCT BULB TIP NO VENT (SUCTIONS) ×2 IMPLANT

## 2016-10-20 NOTE — Anesthesia Procedure Notes (Signed)
Procedure Name: LMA Insertion Date/Time: 10/20/2016 6:44 PM Performed by: Claris Che Pre-anesthesia Checklist: Patient identified, Emergency Drugs available, Suction available, Patient being monitored and Timeout performed Patient Re-evaluated:Patient Re-evaluated prior to inductionOxygen Delivery Method: Circle system utilized and Simple face mask Preoxygenation: Pre-oxygenation with 100% oxygen Intubation Type: IV induction Ventilation: Mask ventilation without difficulty LMA: LMA inserted LMA Size: 5.0 Number of attempts: 2 Airway Equipment and Method: Patient positioned with wedge pillow Placement Confirmation: positive ETCO2 and breath sounds checked- equal and bilateral Tube secured with: Tape Dental Injury: Teeth and Oropharynx as per pre-operative assessment

## 2016-10-20 NOTE — Transfer of Care (Signed)
Immediate Anesthesia Transfer of Care Note  Patient: [name]  Procedure(s) Performed: Procedure(s): IRRIGATION AND DEBRIDEMENT WOUND BILATERAL (Bilateral)  Patient Location: PACU  Anesthesia Type:General  Level of Consciousness: oriented, sedated, patient cooperative and responds to stimulation  Airway & Oxygen Therapy: Patient Spontanous Breathing and Patient connected to nasal cannula oxygen  Post-op Assessment: Report given to RN, Post -op Vital signs reviewed and stable and Patient moving all extremities X 4  Post vital signs: Reviewed and stable  Last Vitals:  Vitals:   10/20/16 1420 10/20/16 1929  BP: 127/62 (!) 151/88  Pulse: 82 81  Resp: 16 17  Temp: 37.1 C 36.4 C    Last Pain:  Vitals:   10/20/16 1929  TempSrc:   PainSc: (P) 0-No pain      Patients Stated Pain Goal: 2 (63/94/32 0037)  Complications: No apparent anesthesia complications

## 2016-10-20 NOTE — Progress Notes (Signed)
   Subjective: No acute events overnight.  Understands plan for surgical debridement with Dr Amalia Hailey this afternoon.  Eager for breakfast.  Objective:  Vital signs in last 24 hours: Vitals:   10/19/16 0443 10/19/16 1343 10/19/16 2215 10/20/16 0452  BP: (!) 124/58 106/61 127/62 (!) 163/83  Pulse: 80 91 80 85  Resp: 17  16 16   Temp: 98.4 F (36.9 C) 98.4 F (36.9 C) 98.8 F (37.1 C) 98.8 F (37.1 C)  TempSrc: Oral Oral Oral Oral  SpO2: 100% 100% 100% 100%  Weight:      Height:       Physical Exam  Constitutional: She is oriented to person, place, and time.  Cachetic woman lying comfortably in bed, in no acute distress  Cardiovascular: Normal rate and regular rhythm.   Pulmonary/Chest: Effort normal and breath sounds normal.  Musculoskeletal:  Bilateral lower legs covered with clean bandages  Neurological: She is alert and oriented to person, place, and time.  Psychiatric: She has a normal mood and affect. Her behavior is normal.   CBC Latest Ref Rng & Units 10/15/2016 10/14/2016 10/08/2016  WBC 4.0 - 10.5 K/uL 10.0 11.2(H) 7.3  Hemoglobin 12.0 - 15.0 g/dL 11.2(L) 9.5(L) 10.5(L)  Hematocrit 36.0 - 46.0 % 34.1(L) 28.8(L) 31.8(L)  Platelets 150 - 400 K/uL 440(H) 343 328   BMP Latest Ref Rng & Units 10/18/2016 10/15/2016 10/14/2016  Glucose 65 - 99 mg/dL 97 155(H) 94  BUN 6 - 20 mg/dL 21(H) 17 17  Creatinine 0.44 - 1.00 mg/dL 0.98 1.24(H) 0.98  BUN/Creat Ratio 9 - 23 - - -  Sodium 135 - 145 mmol/L 139 137 138  Potassium 3.5 - 5.1 mmol/L 4.0 4.0 4.1  Chloride 101 - 111 mmol/L 108 105 106  CO2 22 - 32 mmol/L 24 23 25   Calcium 8.9 - 10.3 mg/dL 8.8(L) 9.1 9.0    Assessment/Plan:  Principal Problem:   Ulcer of lower extremity with fat layer exposed (HCC) Active Problems:   Essential hypertension   Cocaine-induced vascular disorder (HCC)   Cocaine use disorder, severe, dependence (HCC)   Lower extremity ulceration ([place])  56 y.o. 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  Her pain has worsened after running out of opiates, and he wounds seem to be stable and not infected.  Plan for surgical debridement by Podiatry early this week.  #Chronic Wounds #Levimasole Vasculitis #Cocaine Abuse -Counseled regarding risks of cocaine abuse and importance of cessation -Wound care, daily dressing changes -Oxy 5 Q4H PRN, Ibuprofen for pain -IV morphine with dressing changes -She may require chronic opiates for chronic wounds at discharge -Podiatry following, Dr Amalia Hailey planning for surgical debridement afternoon of 1/16  #HTN #Tachycardia Prescribed lisinopril 20 mg at home, noncompliant. -Lisinopril 20 mg daily -Hydralazine 2 mg Q6H PRN for SBP >180, DBP >100  Fluids: none Diet: NPO after breakfast DVT Prophylaxis: lovenox Code Status: full  Dispo: Anticipated discharge 2-3 days.   Minus Liberty, MD 10/20/2016, 7:19 AM Pager: [phone]

## 2016-10-20 NOTE — Brief Op Note (Signed)
10/14/2016 - 10/20/2016  7:33 PM  PATIENT:  [name]  56 y.o. female  PRE-OPERATIVE DIAGNOSIS:  Bilateral lower extremities ulcerations  POST-OPERATIVE DIAGNOSIS:  Bilateral lower extremities ulcerations  PROCEDURE:  Procedure(s): IRRIGATION AND DEBRIDEMENT WOUND BILATERAL (Bilateral)  SURGEON:  Surgeon(s) and Role:    * Edrick Kins, DPM - Primary  PHYSICIAN ASSISTANT: None  ASSISTANTS: none   ANESTHESIA:   local and general  EBL:  Total I/O In: 1000 [I.V.:1000] Out: -   BLOOD ADMINISTERED:none  DRAINS: none   LOCAL MEDICATIONS USED:  BUPIVICAINE  and XYLOCAINE   SPECIMEN:  No Specimen  DISPOSITION OF SPECIMEN:  N/A  COUNTS:  YES  TOURNIQUET:  * No tourniquets in log *  DICTATION: .Dragon Dictation  PLAN OF CARE: Admit to inpatient   PATIENT DISPOSITION:  PACU - hemodynamically stable.   Delay start of Pharmacological VTE agent (>24hrs) due to surgical blood loss or risk of bleeding: not applicable

## 2016-10-20 NOTE — Anesthesia Preprocedure Evaluation (Signed)
Anesthesia Evaluation  Patient identified by MRN, date of birth, ID band Patient awake    Reviewed: Allergy & Precautions, H&P , NPO status , Patient's Chart, lab work & pertinent test results  Airway Mallampati: II   Neck ROM: full    Dental   Pulmonary Current Smoker,    breath sounds clear to auscultation       Cardiovascular hypertension,  Rhythm:regular Rate:Normal     Neuro/Psych  Headaches, PSYCHIATRIC DISORDERS Depression  Neuromuscular disease    GI/Hepatic (+)     substance abuse  cocaine use,   Endo/Other    Renal/GU      Musculoskeletal  (+) Arthritis ,   Abdominal   Peds  Hematology   Anesthesia Other Findings   Reproductive/Obstetrics                             Anesthesia Physical Anesthesia Plan  ASA: III  Anesthesia Plan: General   Post-op Pain Management:    Induction: Intravenous  Airway Management Planned: LMA  Additional Equipment:   Intra-op Plan:   Post-operative Plan:   Informed Consent: I have reviewed the patients History and Physical, chart, labs and discussed the procedure including the risks, benefits and alternatives for the proposed anesthesia with the patient or authorized representative who has indicated his/her understanding and acceptance.     Plan Discussed with: CRNA, Anesthesiologist and Surgeon  Anesthesia Plan Comments:         Anesthesia Quick Evaluation

## 2016-10-20 NOTE — Op Note (Signed)
10/14/2016 - 10/20/2016  7:33 PM  PATIENT:  [name]  56 y.o. female  PRE-OPERATIVE DIAGNOSIS:  Bilateral lower extremities ulcerations secondary to cocaine abuse  POST-OPERATIVE DIAGNOSIS:  Bilateral lower extremities ulcerations secondary to cocaine abuse  PROCEDURE:  Procedure(s): IRRIGATION AND DEBRIDEMENT WOUND BILATERAL (Bilateral)  SURGEON:  Surgeon(s) and Role:    * Edrick Kins, DPM - Primary  PHYSICIAN ASSISTANT: None  ASSISTANTS: none   ANESTHESIA:   local and general  EBL:  Total I/O In: 1000 [I.V.:1000] Out: -   BLOOD ADMINISTERED:none  DRAINS: none   LOCAL MEDICATIONS USED:  BUPIVICAINE  and XYLOCAINE   SPECIMEN:  No Specimen  DISPOSITION OF SPECIMEN:  N/A  COUNTS:  YES  TOURNIQUET:  * No tourniquets in log *  JUSTIFICATION FOR PROCEDURE: 56 year old female with history of cocaine abuse presents to the OR for surgical debridement of bilateral lower extremity ulcerations. The ulcerations are so painful that no one is able to debride the wounds outside of the surgical setting. The patient was told benefits as well as possible side effects of surgery. The patient consented for surgical correction. The patient consent form was reviewed. All patient questions were answered. No guarantees were expressed or implied. The patient and the surgeon boson the patient consent form with the witness present and placed in the patient's chart.  PROCEDURE IN DETAIL: Patient was brought to the operating room placed the operating table in a supine position at which time an aseptic scrub and drape were performed about the patient's bilateral lower extremity after anesthesia was induced as described above. At this time attention was directed to the lower extremity ulcerations were procedure number one commenced.  PROCEDURE #1: Debridement of ulcerations >100 cm bilateral lower extremity  Wound #1 noted to the lateral aspect of the left ankle measuring  4.0x4.0x0.5 cm (LxWxD).   Wound #2 noted to the medial aspect of the left ankle measuring 5.0x5.0x0.5 cm (LxWxD).   Wound #3 noted to the dorsal aspect of the left foot 1st MTP joint measuring 3.0x4.0x0.1cm (LxWxD).   Wound #4 noted to the right posterior ankle measuring approximately 20.0x7.0x0.5 cm (LxWxD).   Medically necessary excisional debridement including muscle and deep fascial tissue tissue was performed all bilateral lower extremity ulcerations using a curette. Excisional debridement of all the necrotic nonviable tissue down to healthy bleeding viable tissue was performed with post-debridement measurements same as pre- to a total wound measurement area of approximately 193 cm.  The wounds were then cleansed with normal saline and dried in preparation for dressings. Viscopaste zinc oxide dressing was applied to the bilateral lower extremities followed by large kerlex and Coban dressings.  The patient was then transferred from the operating room to the recovery room having tolerated the procedure and anesthesia well. All vital signs are stable. After a brief stay in the recovery room the patient was readmitted to her room with prescriptions for adequate analgesic care for in hospital use.  PLAN OF CARE: Admit to inpatient. From a surgical standpoint the wounds are stable. The patient will require long-term wound care at the [facility] wound care facility. The patient is at risk for bilateral below-knee amputations.  The patient can be weightbearing as tolerated to the bilateral lower extremities. Please keep the dressings applied and OR clean dry and intact for 3 days. Recommend long-standing order of Viscopaste zinc oxide medicated gauze, kerlex, and coban or ace wrap dressings.  PATIENT DISPOSITION:  PACU - hemodynamically stable.   Delay  start of Pharmacological VTE agent (>24hrs) due to surgical blood loss or risk of bleeding: not applicable  Edrick Kins, DPM [facility]  Dr. Edrick Kins, [address]                                        [place], [place] [address]                Office [phone]  Fax [phone]

## 2016-10-20 NOTE — Anesthesia Postprocedure Evaluation (Signed)
Anesthesia Post Note  Patient: [name]  Procedure(s) Performed: Procedure(s) (LRB): IRRIGATION AND DEBRIDEMENT WOUND BILATERAL (Bilateral)  Patient location during evaluation: PACU Anesthesia Type: General Level of consciousness: awake and alert and patient cooperative Pain management: pain level controlled Vital Signs Assessment: post-procedure vital signs reviewed and stable Respiratory status: spontaneous breathing and respiratory function stable Cardiovascular status: stable Anesthetic complications: no       Last Vitals:  Vitals:   10/20/16 1945 10/20/16 2000  BP: (!) 162/92 (!) 163/91  Pulse: 80 74  Resp: 13 14  Temp:      Last Pain:  Vitals:   10/20/16 2012  TempSrc:   PainSc: 5     LLE Motor Response: Purposeful movement;Responds to commands (10/20/16 2000) LLE Sensation: Full sensation (10/20/16 2000) RLE Motor Response: Purposeful movement;Responds to commands (10/20/16 2000) RLE Sensation: Full sensation (10/20/16 2000)      [place]

## 2016-10-21 DIAGNOSIS — R000 Tachycardia, unspecified: Secondary | ICD-10-CM

## 2016-10-21 DIAGNOSIS — Z9114 Patient's other noncompliance with medication regimen: Secondary | ICD-10-CM

## 2016-10-21 DIAGNOSIS — I10 Essential (primary) hypertension: Secondary | ICD-10-CM

## 2016-10-21 DIAGNOSIS — Z79899 Other long term (current) drug therapy: Secondary | ICD-10-CM

## 2016-10-21 DIAGNOSIS — L958 Other vasculitis limited to the skin: Secondary | ICD-10-CM

## 2016-10-21 DIAGNOSIS — F14288 Cocaine dependence with other cocaine-induced disorder: Secondary | ICD-10-CM

## 2016-10-21 MED ORDER — OXYCODONE HCL 5 MG PO TABS
10.0000 mg | ORAL_TABLET | ORAL | Status: DC | PRN
  Administered 2016-10-21 – 2016-10-24 (×17): 10 mg via ORAL
  Filled 2016-10-21 (×18): qty 2

## 2016-10-21 MED ORDER — MORPHINE SULFATE (PF) 2 MG/ML IV SOLN
2.0000 mg | Freq: Once | INTRAVENOUS | Status: AC
  Administered 2016-10-21: 2 mg via INTRAVENOUS
  Filled 2016-10-21: qty 1

## 2016-10-21 MED ORDER — MORPHINE SULFATE (PF) 2 MG/ML IV SOLN
2.0000 mg | INTRAVENOUS | Status: DC | PRN
  Administered 2016-10-21: 2 mg via INTRAVENOUS
  Filled 2016-10-21: qty 1

## 2016-10-21 NOTE — Progress Notes (Signed)
   Subjective: Surgical debridement by Dr Amalia Hailey yesterday, pain worse this morning.  Understands plan to discharge home tomorrow with wound care and clinic follow-up.  Objective:  Vital signs in last 24 hours: Vitals:   10/20/16 2040 10/20/16 2101 10/21/16 0130 10/21/16 0600  BP: (!) 160/64 104/72 139/79 (!) 171/89  Pulse: 79 74 77 74  Resp:  15 16 18   Temp: 98.2 F (36.8 C) 98.2 F (36.8 C) 98.4 F (36.9 C) 98.1 F (36.7 C)  TempSrc: Oral Oral Oral Oral  SpO2: 97% 100% 100% 100%  Weight:      Height:       Physical Exam  Constitutional: She is oriented to person, place, and time.  Cachetic woman lying comfortably in bed, in no acute distress  Cardiovascular: Normal rate and regular rhythm.   Pulmonary/Chest: Effort normal and breath sounds normal.  Musculoskeletal:  Bilateral lower legs covered with clean bandages  Neurological: She is alert and oriented to person, place, and time.  Psychiatric: She has a normal mood and affect. Her behavior is normal.   CBC Latest Ref Rng & Units 10/15/2016 10/14/2016 10/08/2016  WBC 4.0 - 10.5 K/uL 10.0 11.2(H) 7.3  Hemoglobin 12.0 - 15.0 g/dL 11.2(L) 9.5(L) 10.5(L)  Hematocrit 36.0 - 46.0 % 34.1(L) 28.8(L) 31.8(L)  Platelets 150 - 400 K/uL 440(H) 343 328   BMP Latest Ref Rng & Units 10/18/2016 10/15/2016 10/14/2016  Glucose 65 - 99 mg/dL 97 155(H) 94  BUN 6 - 20 mg/dL 21(H) 17 17  Creatinine 0.44 - 1.00 mg/dL 0.98 1.24(H) 0.98  BUN/Creat Ratio 9 - 23 - - -  Sodium 135 - 145 mmol/L 139 137 138  Potassium 3.5 - 5.1 mmol/L 4.0 4.0 4.1  Chloride 101 - 111 mmol/L 108 105 106  CO2 22 - 32 mmol/L 24 23 25   Calcium 8.9 - 10.3 mg/dL 8.8(L) 9.1 9.0    Assessment/Plan:  Principal Problem:   Ulcer of lower extremity with fat layer exposed (HCC) Active Problems:   Essential hypertension   Cocaine-induced vascular disorder (HCC)   Cocaine use disorder, severe, dependence (HCC)   Lower extremity ulceration ([place])  56 y.o. female with history  of cocaine abuse, cocaine-associated (levimasole) vasculitis, and chronic lower extremity wounds due to the aforementioned who presents with worsening pain in her chronic wounds.  Her pain has worsened after running out of opiates, and he wounds seem to be stable and not infected.  Surgical debridement on 1/16.  #Chronic Wounds #Levimasole Vasculitis #Cocaine Abuse -Counseled regarding risks of cocaine abuse and importance of cessation -Wound care, daily dressing changes -Oxy 5 Q4H PRN, Ibuprofen for pain -IV morphine with dressing changes -She may require chronic opiates for chronic wounds at discharge  #HTN #Tachycardia Prescribed lisinopril 20 mg at home, noncompliant. -Lisinopril 20 mg daily -Hydralazine 2 mg Q6H PRN for SBP >180, DBP >100  Fluids: none Diet: regular DVT Prophylaxis: lovenox Code Status: full  Dispo: Anticipated discharge in 1 day.   Minus Liberty, MD 10/21/2016, 6:48 AM Pager: [phone]

## 2016-10-21 NOTE — Progress Notes (Signed)
Nutrition Follow-up  DOCUMENTATION CODES:   Severe malnutrition in context of chronic illness  INTERVENTION:   -Continue Boost Breeze po TID, each supplement provides 250 kcal and 9 grams of protein -Continue MVI daily  NUTRITION DIAGNOSIS:   Malnutrition related to chronic illness as evidenced by severe depletion of muscle mass, severe depletion of body fat.  Ongoing  GOAL:   Patient will meet greater than or equal to 90% of their needs  Progressing  MONITOR:   PO intake, Supplement acceptance, Labs, Weight trends, Skin, I & O's  REASON FOR ASSESSMENT:   Malnutrition Screening Tool    ASSESSMENT:   56 y.o. 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  Her pain has worsened after running out of opiates, and he wounds seem to be stable and not infected.  S/p Procedure(s) 10/20/16: IRRIGATION AND DEBRIDEMENT WOUND BILATERAL (Bilateral)  Pt reports continued good appetite. Meal completion 75-100%. Pt continues to accept Boost Breeze supplements and MVI well. Discussed off continued good nutrition to support healing.   Labs reviewed.   Diet Order:  Diet regular Room service appropriate? Yes; Fluid consistency: Thin  Skin:  Wound (see comment) (levamisole vasculitis on rt lower leg, lt lower leg, lt foot)  Last BM:  10/15/16  Height:   Ht Readings from Last 1 Encounters:  10/14/16 5' 1"  (1.549 m)    Weight:   Wt Readings from Last 1 Encounters:  10/14/16 98 lb (44.5 kg)    Ideal Body Weight:  47.7 kg  BMI:  Body mass index is 18.52 kg/m.  Estimated Nutritional Needs:   Kcal:  1400-1600  Protein:  65-80 grams  Fluid:  >1.4 L  EDUCATION NEEDS:   No education needs identified at this time  Jaid Quirion A. Jimmye Norman, RD, LDN, CDE Pager: [phone] After hours Pager: [phone]

## 2016-10-22 ENCOUNTER — Encounter (HOSPITAL_COMMUNITY): Payer: Self-pay | Admitting: Podiatry

## 2016-10-22 DIAGNOSIS — Z9889 Other specified postprocedural states: Secondary | ICD-10-CM

## 2016-10-22 MED ORDER — OXYCODONE HCL 5 MG PO TABS
5.0000 mg | ORAL_TABLET | Freq: Once | ORAL | Status: AC
  Administered 2016-10-22: 5 mg via ORAL
  Filled 2016-10-22: qty 1

## 2016-10-22 NOTE — Evaluation (Addendum)
Physical Therapy Evaluation Patient Details Name: [name] MRN: [id number] DOB: October 14, 1960 Today's Date: 10/22/2016   History of Present Illness  [name] is a 56 y.o. 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  Now s/o I&D and working on pain control.  Clinical Impression  Patient presents with poor tolerance to activity crying when first attempted to stand today, so allowed pt to rest and came later after medications working better.  Able to walk few feet in room only and continues to be limited by pain.  She would be willing to go to SNF for rehab only but prefers home once her pain is better controlled.  Not safe to d/c home at this time due unable to access second level room in boarding house (reports working on main level room).  Will follow along until d/c.    Follow Up Recommendations SNF    Equipment Recommendations  None recommended by PT    Recommendations for Other Services       Precautions / Restrictions Precautions Precautions: Fall      Mobility  Bed Mobility Overal bed mobility: Modified Independent                Transfers Overall transfer level: Needs assistance Equipment used: Rolling walker (2 wheeled) Transfers: Sit to/from Stand Sit to Stand: Supervision         General transfer comment: assist for safety, increased attempts for success due to pain, pt not wanting me to help  Ambulation/Gait Ambulation/Gait assistance: Supervision Ambulation Distance (Feet): 12 Feet Assistive device: Rolling walker (2 wheeled) Gait Pattern/deviations: Step-to pattern;Trunk flexed     General Gait Details: heavy UE support on walker, needs to stay flexed and keeps feet anterior due to ankle DF limitations; limited distance due to pain  Stairs            Wheelchair Mobility    Modified Rankin (Stroke Patients Only)       Balance  Overall balance assessment: Needs assistance   Sitting balance-Leahy Scale: Good     Standing balance support: Bilateral upper extremity supported Standing balance-Leahy Scale: Poor Standing balance comment: UE support for balance due to LE pain                             Pertinent Vitals/Pain Pain Assessment: 0-10 Pain Score: 9  Pain Location: both lower legs Pain Descriptors / Indicators: Aching Pain Intervention(s): Monitored during session    Home Living Family/patient expects to be discharged to:: Private residence ([facility]) Living Arrangements: Other relatives (brother)     Home Access: Stairs to enter Entrance Stairs-Rails: Right Entrance Stairs-Number of Steps: 2 Home Layout: Two level;Bed/bath upstairs Home Equipment: Cane - single point;Walker - 2 wheels;Wheelchair - manual Additional Comments: flight of stairs to her room in the boarding house    Prior Function Level of Independence: Independent with assistive device(s)               Hand Dominance   Dominant Hand: Right    Extremity/Trunk Assessment   Upper Extremity Assessment Upper Extremity Assessment: Overall WFL for tasks assessed    Lower Extremity Assessment Lower Extremity Assessment: LLE deficits/detail;RLE deficits/detail RLE Deficits / Details: limited testing due to pain, able to lift both legs antigravity, both feet are wrapped R LE with drainage through dressing on posterior calf/ankle; difficulty with  ankle DF even in stance due to pain LLE Deficits / Details: limited testing due to pain, able to lift both legs antigravity, both feet are wrapped R LE with drainage through dressing on posterior calf/ankle; difficulty with ankle DF even in stance due to pain       Communication   Communication: No difficulties  Cognition Arousal/Alertness: Awake/alert Behavior During Therapy: WFL for tasks assessed/performed Overall Cognitive Status: Within Functional Limits for  tasks assessed                      General Comments      Exercises     Assessment/Plan    PT Assessment Patient needs continued PT services  PT Problem List Decreased range of motion;Decreased strength;Decreased activity tolerance;Decreased balance;Pain;Decreased knowledge of use of DME;Decreased safety awareness          PT Treatment Interventions DME instruction;Gait training;Therapeutic exercise;Patient/family education;Stair training;Balance training;Therapeutic activities;Functional mobility training    PT Goals (Current goals can be found in the Care Plan section)  Acute Rehab PT Goals Patient Stated Goal: To stay until can go home PT Goal Formulation: With patient Time For Goal Achievement: 10/29/16 Potential to Achieve Goals: Fair    Frequency Min 3X/week   Barriers to discharge        Co-evaluation               End of Session Equipment Utilized During Treatment: Gait belt Activity Tolerance: Patient limited by pain Patient left: in bed;with call bell/phone within reach           Time: 1128-1151 PT Time Calculation (min) (ACUTE ONLY): 23 min   Charges:   PT Evaluation $PT Eval Moderate Complexity: 1 Procedure PT Treatments $Gait Training: 8-22 mins   PT G Codes:        [name] 11/14/16, 12:00 PM  [name], [place] November 14, 2016

## 2016-10-22 NOTE — ED Provider Notes (Signed)
[facility] DEPT Provider Note   CSN: [id number] Arrival date & time: 10/14/16  0259     History   Chief Complaint Chief Complaint  Patient presents with  . Wound Infection  . Foot Pain    HPI [name] is a 56 y.o. female.  Patient well known to the emergency department presents with complaint of pain in bilateral lower extremities at the site of chronic ulcerations. She has been followed by the wound care clinic but reports she no longer goes secondary to pain with wound care and bandage changes. She states she treats her wounds herself at home. She usually take Percocet for pain but ran out 2 days ago. No fever. She states she is in severe pain and is crying inconsolably on presentation.    The history is provided by the patient and the EMS personnel. No language interpreter was used.    Past Medical History:  Diagnosis Date  . CAP (community acquired pneumonia) 03/2014 X 2  . Cocaine abuse    ongoing with resultant vaculitis.  . Depression   . Headache    "weekly" (07/29/2016)  . Hypertension   . Inflammatory arthritis   . Migraines    "probably 5-6/yr" (07/29/2016)  . Normocytic anemia    BL Hgb 9.8-12. Last anemia panel 04/2010 - showing Fe 19, ferritin 101.  Pt on monthly B12 injections  . Rheumatoid arthritis(714.0)    patient reported  . 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 Followed by Dr. Louanne Skye    Patient Active Problem List   Diagnosis Date Noted  . Ulcer of lower extremity with fat layer exposed ([place]) 10/14/2016  . Lower extremity ulceration ([place]) 10/14/2016  . Acute kidney injury ([place]) 09/09/2016  . Palliative care encounter   . Wound drainage   . Infectious myositis   . Wound dehiscence   . Finger necrosis ([place]) 05/18/2016  . AKI (acute kidney injury) ([place])   . Leg ulcer ([place])   . 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 ([place]) 06/04/2015  . Pressure ulcer 04/22/2015  . Skin necrosis  ([place]) 04/21/2015  . Protein-calorie malnutrition, severe ([place]) 01/15/2015  . MDD (major depressive disorder), recurrent episode, severe ([place]) 10/16/2014  . Cocaine use disorder, severe, dependence ([place]) 10/10/2014  . Cannabis use disorder, severe, dependence ([place]) 10/10/2014  . Substance induced mood disorder ([place]) 10/09/2014  . 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 Hepatomegaly 04/11/2014  . Acute pericarditis 03/31/2014  . Inflammatory arthritis 09/24/2013  . 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 Tobacco abuse 01/03/2013  . hyperlipidemia 01/03/2013  . Essential hypertension 02/26/2010    Past Surgical History:  Procedure Laterality Date  . AMPUTATION Left 05/22/2016   Procedure: AMPUTATION LEFT LONG FINGER;  Surgeon: Marybelle Killings, MD;  Location: [facility];  Service: Orthopedics;  Laterality: Left;  . HERNIA REPAIR     "stomach"  . I&D EXTREMITY Right 09/26/2015   Procedure: IRRIGATION AND DEBRIDEMENT LEG WOUND  VAC PLACEMENT.;  Surgeon: Loel Lofty Dillingham, DO;  Location: [facility];  Service: Plastics;  Laterality: Right;  . IRRIGATION AND DEBRIDEMENT ABSCESS Bilateral 09/26/2013   Procedure: DEBRIDEMENT ULCERS BILATERAL THIGHS;  Surgeon: Gwenyth Ober, MD;  Location: [facility];  Service: General;  Laterality: Bilateral;  . SKIN BIOPSY Bilateral    shin nodules    OB History    No data available       Home Medications    Prior to  Admission medications   Medication Sig Start Date End Date Taking? Authorizing Provider  lisinopril (PRINIVIL,ZESTRIL) 10 MG tablet Take 2 tablets (20 mg total) by mouth daily. Patient not taking: Reported on 10/14/2016 09/20/16   Junius Creamer, NP    Family History Family History  Problem Relation Age of Onset  . Breast cancer Mother     Breast cancer  . Alcohol abuse Mother   . Colon cancer Maternal Aunt 31  . Alcohol abuse Father     Social History Social History  Substance Use Topics  . Smoking status: Current Every Day Smoker     Packs/day: 0.12    Years: 38.00    Types: Cigarettes  . Smokeless tobacco: Never Used  . Alcohol use No     Allergies   Acetaminophen and Other   Review of Systems Review of Systems  Constitutional: Negative for chills and fever.  HENT: Negative.   Respiratory: Negative.   Cardiovascular: Negative.   Gastrointestinal: Negative.   Musculoskeletal:       Bilateral LE pain  Skin: Positive for wound.  Neurological: Negative.  Negative for numbness.     Physical Exam Updated Vital Signs BP (!) 149/85 (BP Location: Left Arm)   Pulse 79   Temp 98.1 F (36.7 C) (Oral)   Resp 20   Ht 5' 1"  (1.549 m)   Wt 44.5 kg   SpO2 100%   BMI 18.52 kg/m   Physical Exam  Constitutional: She appears well-developed and well-nourished. She appears distressed.  HENT:  Head: Normocephalic.  Neck: Normal range of motion. Neck supple.  Cardiovascular: Normal rate and regular rhythm.   Pulmonary/Chest: Effort normal and breath sounds normal.  Abdominal: Soft. Bowel sounds are normal. There is no tenderness. There is no rebound and no guarding.  Musculoskeletal: Normal range of motion.  Neurological: She is alert.  Skin: Skin is warm and dry. No rash noted.  Multiple deep ulcerations to posterior LE's and feet. See downloaded images. No surrounding erythema, purulent drainage or active bleeding.   Psychiatric: She has a normal mood and affect.     ED Treatments / Results  Labs (all labs ordered are listed, but only abnormal results are displayed) Labs Reviewed  MRSA PCR SCREENING - Abnormal; Notable for the following:       Result Value   MRSA by PCR POSITIVE (*)    All other components within normal limits  CBC WITH DIFFERENTIAL/PLATELET - Abnormal; Notable for the following:    WBC 11.2 (*)    RBC 3.45 (*)    Hemoglobin 9.5 (*)    HCT 28.8 (*)    Neutro Abs 8.0 (*)    All other components within normal limits  COMPREHENSIVE METABOLIC PANEL - Abnormal; Notable for the  following:    Total Protein 6.1 (*)    Albumin 2.9 (*)    ALT 11 (*)    All other components within normal limits  RAPID URINE DRUG SCREEN, HOSP PERFORMED - Abnormal; Notable for the following:    Cocaine POSITIVE (*)    All other components within normal limits  BASIC METABOLIC PANEL - Abnormal; Notable for the following:    Glucose, Bld 155 (*)    Creatinine, Ser 1.24 (*)    GFR calc non Af Amer 49 (*)    GFR calc Af Amer 56 (*)    All other components within normal limits  CBC - Abnormal; Notable for the following:    Hemoglobin 11.2 (*)  HCT 34.1 (*)    Platelets 440 (*)    All other components within normal limits  BASIC METABOLIC PANEL - Abnormal; Notable for the following:    BUN 21 (*)    Calcium 8.8 (*)    All other components within normal limits    EKG  EKG Interpretation  Date/Time:  Wednesday October 14 2016 03:26:01 EST Ventricular Rate:  100 PR Interval:    QRS Duration: 84 QT Interval:  398 QTC Calculation: 514 R Axis:   74 Text Interpretation:  Sinus tachycardia Ventricular premature complex Aberrant conduction of SV complex(es) Biatrial enlargement Left ventricular hypertrophy Prolonged QT interval Interpretation limited secondary to artifact Confirmed by Christy Gentles  MD, DONALD ([id number]) on 10/14/2016 3:39:41 AM       Radiology No results found.  Procedures Procedures (including critical care time)  Medications Ordered in ED Medications  ibuprofen (ADVIL,MOTRIN) tablet 600 mg (600 mg Oral Given 10/22/16 0449)  lisinopril (PRINIVIL,ZESTRIL) tablet 20 mg (20 mg Oral Given 10/21/16 0953)  enoxaparin (LOVENOX) injection 40 mg (40 mg Subcutaneous Given 10/21/16 1810)  polyethylene glycol (MIRALAX / GLYCOLAX) packet 17 g ( Oral MAR Unhold 10/20/16 2037)  feeding supplement (BOOST / RESOURCE BREEZE) liquid 1 Container (1 Container Oral Not Given 10/21/16 2000)  multivitamins with iron tablet 1 tablet (1 tablet Oral Given 10/21/16 0951)  mupirocin ointment  (BACTROBAN) 2 % 1 application (1 application Nasal Given 10/19/16 0956)  sodium chloride 0.9 % 1,000 mL infusion ( Intravenous New Bag/Given 10/15/16 1211)  collagenase (SANTYL) ointment ( Topical Not Given 10/21/16 1000)  oxyCODONE (Oxy IR/ROXICODONE) immediate release tablet 10 mg (10 mg Oral Given 10/22/16 0224)  oxyCODONE (Oxy IR/ROXICODONE) immediate release tablet 10 mg (10 mg Oral Given 10/14/16 0352)  LORazepam (ATIVAN) injection 1 mg (1 mg Intravenous Given 10/14/16 0441)  lisinopril (PRINIVIL,ZESTRIL) tablet 20 mg (20 mg Oral Given 10/14/16 0846)  Chlorhexidine Gluconate Cloth 2 % PADS 6 each (6 each Topical Given 10/19/16 0536)  ceFAZolin (ANCEF) IVPB 2g/100 mL premix (2 g Intravenous Given 10/20/16 1843)  HYDROmorphone (DILAUDID) 1 MG/ML injection (  Duplicate 0/01/74 9449)  HYDROmorphone (DILAUDID) 1 MG/ML injection (  Duplicate 6/75/91 6384)  morphine 2 MG/ML injection 2 mg (2 mg Intravenous Given 10/21/16 0052)  morphine 2 MG/ML injection 2 mg (2 mg Intravenous Given 10/21/16 0500)     Initial Impression / Assessment and Plan / ED Course  I have reviewed the triage vital signs and the nursing notes.  Pertinent labs & imaging results that were available during my care of the patient were reviewed by me and considered in my medical decision making (see chart for details).     Patient presents with painful, chronic ulcerations to bilateral lower extremities. History of cocaine-induced vasculitis and persistent cocaine abuse. No fever. Pain very difficult to control. The patient refuses to ambulate secondary to pain. She lives alone, has in-home care and symptoms uncontrolled. Will admit for further management, pain control and potential social worker intervention for in-home care assessment.   Final Clinical Impressions(s) / ED Diagnoses   Final diagnoses:  Ulcer of lower extremity with fat layer exposed, unspecified laterality ([place])  Vasculitis [facility])    New Prescriptions Current  Discharge Medication List       Charlann Lange, Hershal Coria 10/22/16 [place], MD 10/23/16 1431

## 2016-10-22 NOTE — NC FL2 (Signed)
[place] LEVEL OF CARE SCREENING TOOL     IDENTIFICATION  Patient Name: [name] Birthdate: 1961-06-20 Sex: female Admission Date (Current Location): 10/14/2016  [facility] and [place] Number:  Herbalist and Address:  [facility] [place] [address] [place], [place] [address]      Provider Number: [id number]  Attending Physician Name and Address:  Lucious Groves, DO  Relative Name and Phone Number:       Current Level of Care: SNF Recommended Level of Care: [place] Prior Approval Number:    Date Approved/Denied:   PASRR Number:    Discharge Plan: SNF    Current Diagnoses: Patient Active Problem List   Diagnosis Date Noted  . Ulcer of lower extremity with fat layer exposed ([place]) 10/14/2016  . Lower extremity ulceration ([place]) 10/14/2016  . Acute kidney injury ([place]) 09/09/2016  . Palliative care encounter   . Wound drainage   . Infectious myositis   . Wound dehiscence   . Finger necrosis ([place]) 05/18/2016  . AKI (acute kidney injury) ([place])   . Leg ulcer ([place])   . 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 ([place]) 06/04/2015  . Pressure ulcer 04/22/2015  . Skin necrosis ([place]) 04/21/2015  . Protein-calorie malnutrition, severe ([place]) 01/15/2015  . MDD (major depressive disorder), recurrent episode, severe ([place]) 10/16/2014  . Cocaine use disorder, severe, dependence ([place]) 10/10/2014  . Cannabis use disorder, severe, dependence ([place]) 10/10/2014  . Substance induced mood disorder ([place]) 10/09/2014  . 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 Hepatomegaly 04/11/2014  . Acute pericarditis 03/31/2014  . Inflammatory arthritis 09/24/2013  . 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 Tobacco abuse 01/03/2013  . hyperlipidemia 01/03/2013  . 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)                       Personal Care Assistance Level of Assistance  Bathing, Feeding, Dressing Bathing Assistance: Limited assistance Feeding assistance: Independent Dressing Assistance: Limited assistance     Functional Limitations Info  Sight, Hearing, Speech Sight Info: Adequate Hearing Info: Adequate Speech Info: Adequate    SPECIAL CARE FACTORS FREQUENCY  PT (By licensed PT)     PT Frequency:  (5x/week)              Contractures Contractures Info: Not present    Additional Factors Info  Allergies Code Status Info:  (full) Allergies Info:  (acetaminophen)           Current Medications (10/22/2016):  This is the current hospital active medication list Current Facility-Administered Medications  Medication Dose Route Frequency Provider Last Rate Last Dose  . collagenase (SANTYL) ointment   Topical Daily Annia Belt, MD   Stopped at 10/20/16 1000  . enoxaparin (LOVENOX) injection 40 mg  40 mg Subcutaneous Q24H Jule Ser, DO   40 mg at 10/22/16 1647  . feeding supplement (BOOST / RESOURCE BREEZE) liquid 1 Container  1 Container Oral TID BM Annia Belt, MD   1 Container at 10/22/16 1948  . ibuprofen (ADVIL,MOTRIN) tablet 600 mg  600 mg Oral Q8H PRN Minus Liberty, MD   600 mg at 10/22/16 1647  . lisinopril (PRINIVIL,ZESTRIL) tablet 20 mg  20 mg Oral Daily Jule Ser, DO   20 mg at 10/22/16 1007  . multivitamins with iron tablet 1 tablet  1 tablet Oral Daily Annia Belt, MD   1 tablet at 10/22/16 1007  . oxyCODONE (Oxy IR/ROXICODONE) immediate release tablet 10 mg  10 mg Oral Q4H PRN Minus Liberty, MD   10 mg at 10/22/16 1948  . polyethylene glycol (MIRALAX / GLYCOLAX) packet 17 g  17  g Oral Daily PRN Jule Ser, DO         Discharge Medications: Please see discharge summary for a list of discharge medications.  Relevant Imaging Results:  Relevant Lab Results:   Additional Information    Hadasah Brugger M, LCSW

## 2016-10-22 NOTE — Progress Notes (Signed)
   Subjective: Legs still in pain, no improvement with increased dose of oxycodone yesterday.  Unable to participate in PT.  She loosened the bandages on her legs because she thought they were too tight and she needed to scratch.  Discussed importance of leaving them in place and not scratch.  Discussed importance of discharge to SNF for rehab and wound care.  Objective:  Vital signs in last 24 hours: Vitals:   10/21/16 0600 10/21/16 0952 10/21/16 1008 10/22/16 0429  BP: (!) 171/89 (!) 153/87 (!) 143/74 (!) 149/85  Pulse: 74 84 78 79  Resp: 18  18 20   Temp: 98.1 F (36.7 C)  98.8 F (37.1 C) 98.1 F (36.7 C)  TempSrc: Oral  Oral Oral  SpO2: 100%  100% 100%  Weight:      Height:       Physical Exam  Constitutional: She is oriented to person, place, and time.  Cachetic woman lying comfortably in bed, in no acute distress  Cardiovascular: Normal rate and regular rhythm.   Pulmonary/Chest: Effort normal and breath sounds normal.  Musculoskeletal:  Bilateral lower legs covered with clean bandages  Neurological: She is alert and oriented to person, place, and time.  Psychiatric: She has a normal mood and affect. Her behavior is normal.   CBC Latest Ref Rng & Units 10/15/2016 10/14/2016 10/08/2016  WBC 4.0 - 10.5 K/uL 10.0 11.2(H) 7.3  Hemoglobin 12.0 - 15.0 g/dL 11.2(L) 9.5(L) 10.5(L)  Hematocrit 36.0 - 46.0 % 34.1(L) 28.8(L) 31.8(L)  Platelets 150 - 400 K/uL 440(H) 343 328   BMP Latest Ref Rng & Units 10/18/2016 10/15/2016 10/14/2016  Glucose 65 - 99 mg/dL 97 155(H) 94  BUN 6 - 20 mg/dL 21(H) 17 17  Creatinine 0.44 - 1.00 mg/dL 0.98 1.24(H) 0.98  BUN/Creat Ratio 9 - 23 - - -  Sodium 135 - 145 mmol/L 139 137 138  Potassium 3.5 - 5.1 mmol/L 4.0 4.0 4.1  Chloride 101 - 111 mmol/L 108 105 106  CO2 22 - 32 mmol/L 24 23 25   Calcium 8.9 - 10.3 mg/dL 8.8(L) 9.1 9.0    Assessment/Plan:  Principal Problem:   Ulcer of lower extremity with fat layer exposed (HCC) Active Problems:  Essential hypertension   Cocaine-induced vascular disorder (HCC)   Cocaine use disorder, severe, dependence (HCC)   Lower extremity ulceration ([place])  56 y.o. 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  Her pain has worsened after running out of opiates, and he wounds seem to be stable and not infected.  Surgically debrided on 1/16, now medically ready for discharge.  #Chronic Wounds #Levimasole Vasculitis #Cocaine Abuse -Counseled regarding risks of cocaine abuse and importance of cessation -Wound care, daily dressing changes -Oxy 10 Q4H PRN, Ibuprofen for pain -She may require chronic opiates for chronic wounds at discharge -PT Eval  #HTN #Tachycardia Prescribed lisinopril 20 mg at home, noncompliant. -Lisinopril 20 mg daily -Hydralazine 2 mg Q6H PRN for SBP >180, DBP >100  Fluids: none Diet: regular DVT Prophylaxis: lovenox Code Status: full  Dispo: Anticipated discharge in 0-1 days.   To SNF for rehab and wound care.  Minus Liberty, MD 10/22/2016, 6:23 AM Pager: [phone]

## 2016-10-23 MED ORDER — TAB-A-VITE/IRON PO TABS: 1.0000 | ORAL_TABLET | Freq: Every day | ORAL | 0 refills | Status: DC

## 2016-10-23 MED ORDER — COLLAGENASE 250 UNIT/GM EX OINT
TOPICAL_OINTMENT | Freq: Every day | CUTANEOUS | 0 refills | Status: DC
Start: 2016-10-23 — End: 2017-04-08

## 2016-10-23 MED ORDER — LISINOPRIL 20 MG PO TABS: 20.0000 mg | ORAL_TABLET | Freq: Every day | ORAL | Status: DC

## 2016-10-23 MED ORDER — BOOST / RESOURCE BREEZE PO LIQD: 1.0000 | Freq: Three times a day (TID) | ORAL | 0 refills | Status: DC

## 2016-10-23 MED ORDER — IBUPROFEN 600 MG PO TABS
600.0000 mg | ORAL_TABLET | Freq: Three times a day (TID) | ORAL | 0 refills | Status: DC | PRN
Start: 2016-10-23 — End: 2016-11-05

## 2016-10-23 MED ORDER — SENNA 8.6 MG PO TABS: 1.0000 | ORAL_TABLET | Freq: Every day | ORAL | 0 refills | Status: DC | PRN

## 2016-10-23 MED ORDER — OXYCODONE HCL 10 MG PO TABS: 10.0000 mg | ORAL_TABLET | ORAL | 0 refills | Status: DC | PRN

## 2016-10-23 NOTE — Progress Notes (Signed)
Physical Therapy Treatment Patient Details Name: [name] MRN: [id number] DOB: December 26, 1960 Today's Date: 10/23/2016    History of Present Illness [name] is a 57 y.o. 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  Now s/o I&D and working on pain control.    PT Comments    Pt not progressing towards goals. Continues to be limited by pain in BUEs. Attempted stair training with pt today but pt unable 2/2 pain after 2 ft of ambulation. Pt cannot bear weight through BLEs for longer than 3 minutes without intense pain. Pt able with good ability to transfer sit<> stand due to good UE strength . Pt continues to be appropriate for d/c to SNF following hospital stay to address limited mobility and activity tolerance before return to home. PT will continue to follow acutely.   Follow Up Recommendations  SNF     Equipment Recommendations  None recommended by PT    Recommendations for Other Services       Precautions / Restrictions Precautions Precautions: Fall Restrictions Weight Bearing Restrictions: No    Mobility  Bed Mobility Overal bed mobility: Modified Independent                Transfers Overall transfer level: Needs assistance Equipment used: Rolling walker (2 wheeled) Transfers: Sit to/from [facility] Sit to Stand: Min guard Stand pivot transfers: Min assist       General transfer comment: Min guard for safety with sit<>stand. Pt with good UE strength to power to stand. Limited weight bearing through LE. Min A to control lowering with stand pivot.   Ambulation/Gait Ambulation/Gait assistance: Min guard Ambulation Distance (Feet): 2 Feet Assistive device: Rolling walker (2 wheeled) Gait Pattern/deviations: Step-to pattern;Trunk flexed Gait velocity: decreased Gait velocity interpretation: Below normal speed for age/gender General Gait  Details: heavy UE support on walker, needs to stay flexed and keeps feet anterior due to ankle DF limitations; limited distance due to pain   Stairs Stairs:  (attempted but pt too painful after 2 ft ambulation)          Wheelchair Mobility    Modified Rankin (Stroke Patients Only)       Balance Overall balance assessment: Needs assistance Sitting-balance support: No upper extremity supported;Feet supported Sitting balance-Leahy Scale: Good     Standing balance support: Bilateral upper extremity supported Standing balance-Leahy Scale: Poor Standing balance comment: UE support for balance due to LE pain                    Cognition Arousal/Alertness: Awake/alert Behavior During Therapy: WFL for tasks assessed/performed Overall Cognitive Status: Within Functional Limits for tasks assessed                      Exercises      General Comments        Pertinent Vitals/Pain Pain Assessment: 0-10 Pain Score: 9  Pain Location: both lower legs Pain Descriptors / Indicators: Aching Pain Intervention(s): Monitored during session;Limited activity within patient's tolerance;RN gave pain meds during session    Home Living                      Prior Function            PT Goals (current goals can now be found in the care plan section) Acute Rehab PT Goals Patient Stated Goal: To  stay until can go home PT Goal Formulation: With patient Time For Goal Achievement: 10/29/16 Potential to Achieve Goals: Fair Progress towards PT goals: Not progressing toward goals - comment (limited 2/2 pain)    Frequency    Min 3X/week      PT Plan Current plan remains appropriate    Co-evaluation             End of Session Equipment Utilized During Treatment: Gait belt Activity Tolerance: Patient limited by pain Patient left: in bed;with call bell/phone within reach     Time: 1147-1210 PT Time Calculation (min) (ACUTE ONLY): 23 min  Charges:   $Therapeutic Activity: 8-22 mins $Self Care/Home Management: 8-22                    G Codes:      Tonia Brooms November 02, 2016, 12:25 PM Tonia Brooms, SPT [phone]

## 2016-10-23 NOTE — Care Management Note (Signed)
Case Management Note  Patient Details  Name: [name] MRN: [id number] Date of Birth: May 10, 1961  Subjective/Objective:  Chronic wounds, Levimosole Vasculitis                  Action/Plan: Discharge Planning: AVS reviewed: NCM spoke to pt and she is agreeable to SNF-rehab. States she was living with her brother, [name]. Has RW and wheelchair at home. [place], [place]. They cannot accept referral due to pt's noncompliance. Contacted CSW for follow up on SNF placement.   PCP Zada Finders MD    Expected Discharge Date:  10/23/16               Expected Discharge Plan:  Skilled Nursing Facility  In-House Referral:  Clinical Social Work  Discharge planning Services  CM Consult  Post Acute Care Choice:  NA Choice offered to:  NA  DME Arranged:  N/A DME Agency:  NA  HH Arranged:  NA HH Agency:  NA  Status of Service:  Completed, signed off  If discussed at [employer] of Stay Meetings, dates discussed:    Additional Comments:  Erenest Rasher, RN 10/23/2016, 11:48 AM

## 2016-10-23 NOTE — Discharge Instructions (Signed)
Daily dressing changes for her lower leg wounds with viscopaste zinc oxide medicated gauze, kerlex, and coban or ace wrap dressings.  Pain control with oral narcotics and NSAIDs.  Oxycodone 10 mg Q4H PRN and ibuprofen. Physical therapy.  Substance abuse counseling and inpatient/intensive outpatient program placement.

## 2016-10-23 NOTE — Discharge Summary (Signed)
Name: [name] MRN: [id number] DOB: [birth year] 56 y.o. 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   feeding supplement Liqd Take 1 Container by mouth 3 (three) times daily between meals.   ibuprofen 600 MG tablet Commonly known as:  ADVIL,MOTRIN Take 1 tablet (600 mg total) by mouth every 8 (eight) hours as needed for mild pain or moderate pain.   lisinopril 20 MG tablet Commonly known as:  PRINIVIL,ZESTRIL Take 1 tablet (20 mg total) by mouth daily. What changed:  medication strength   multivitamins with iron Tabs tablet Take 1 tablet by mouth daily. Start taking on:  10/24/2016   Oxycodone HCl 10 MG Tabs Take 1 tablet (10 mg total) by mouth every 4 (four) hours as needed for severe pain.   senna 8.6 MG Tabs tablet Commonly known as:  SENOKOT Take 1 tablet (8.6 mg total) by mouth daily as needed for mild constipation.       Disposition and follow-up:   [name] was discharged from [facility] in Stable condition.  At the hospital follow up visit please address:  1.  Chronic Wounds.  Supplies and skilled help for wound care.  Daily dressing changes with viscopaste zinc oxide medicated  gauze, kerlex, and coban or ace wrap dressings.   Home health wound care, regular follow-up at [facility] clinic, and at [facility].  2.  Cocaine Abuse.  Encourage abstinence.  Facilitate SUD treatment including placement with [facility].  3.  Chronic Pain.  She should be seen frequently in clinic, pain evaluated, and oral narcotics prescribed as appropriate for her chronic pain.  4.  Labs / imaging needed at time of follow-up: none  5.  Pending labs/ test needing follow-up: none  Follow-up Appointments: Follow-up Information    Schedule an appointment as soon as possible for a visit  with [place].   Specialty:  Internal Medicine Why:  for continued pain relief. 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  She presented with worsening pain in her lower legs several days after running out of oxycodone.  The pain was severe enough that she could not get around  at home to take care of herself.  She was afebrile, without leukocytosis, no purulent drainage or odor from wounds, and not other signs/symptoms of systemic infection.  Her wounds were surgically debrided by podiatrist Dr Daylene Katayama on 1/16, and pain controlled with oral and IV narcotics.  She will require continuing wound care and may well need chronic narcotics.  She is at high risk of bilateral BKAs due to these wounds.  SNF placement was attempted but a  bed could not be obtained due to her ongoing substance abuse.  She was discharged home with orders for Home Health RN and instructions to follow-up at [facility], Wound Care, and Podiatry.  2. Cocaine Use Disorder Advised to abstain from cocaine, and that her wounds will not heal if she continues using cocaine.  Encouraged to continue pursuing treatment with [facility].  3. HTN Normotensive to slightly hypertensive on lisinopril 20 mg daily.  Discharge Vitals:   BP (!) 164/92 (BP Location: Left Arm)   Pulse 73   Temp 98.8 F (37.1 C) (Oral)   Resp 18   Ht 5' 1"  (1.549 m)   Wt 98 lb (44.5 kg)   SpO2 100%   BMI 18.52 kg/m   Pertinent Labs, Studies, and Procedures:   CBC Latest Ref Rng & Units 10/15/2016 10/14/2016 10/08/2016  WBC 4.0 - 10.5 K/uL 10.0 11.2(H) 7.3  Hemoglobin 12.0 - 15.0 g/dL 11.2(L) 9.5(L) 10.5(L)  Hematocrit 36.0 - 46.0 % 34.1(L) 28.8(L) 31.8(L)  Platelets 150 - 400 K/uL 440(H) 343 328   BMP Latest Ref Rng & Units 10/18/2016 10/15/2016 10/14/2016  Glucose 65 - 99 mg/dL 97 155(H) 94  BUN 6 - 20 mg/dL 21(H) 17 17  Creatinine 0.44 - 1.00 mg/dL 0.98 1.24(H) 0.98  BUN/Creat Ratio 9 - 23 - - -  Sodium 135 - 145 mmol/L 139 137 138  Potassium 3.5 - 5.1 mmol/L 4.0 4.0 4.1  Chloride 101 - 111 mmol/L 108 105 106  CO2 22 - 32 mmol/L 24 23 25   Calcium 8.9 - 10.3 mg/dL 8.8(L) 9.1 9.0    Discharge Instructions: Discharge Instructions    Diet - low sodium heart healthy    Complete by:  As directed    Increase activity slowly    Complete by:  As directed      Daily dressing changes for her lower leg wounds with viscopaste zinc oxide medicated gauze, kerlex, and coban or ace wrap dressings.  Pain control with oral narcotics and NSAIDs.  Oxycodone 10 mg Q4H PRN and ibuprofen. Physical therapy.  Substance abuse counseling and inpatient/intensive outpatient program placement.  Signed: Minus Liberty, MD 10/23/2016, 12:29 PM   Pager: [phone]

## 2016-10-23 NOTE — Progress Notes (Addendum)
   Subjective: No acute events overnight.  Legs still hurt.  Ready to go to SNF to continue recuperating.  Objective:  Vital signs in last 24 hours: Vitals:   10/22/16 0429 10/22/16 1430 10/22/16 2100 10/23/16 0618  BP: (!) 149/85 130/73 140/73 (!) 164/92  Pulse: 79 86 85 73  Resp: 20 20 18 18   Temp: 98.1 F (36.7 C) 97.7 F (36.5 C) 98.6 F (37 C) 98.8 F (37.1 C)  TempSrc: Oral Oral Oral Oral  SpO2: 100% 100% 98% 100%  Weight:      Height:       Physical Exam  Constitutional: She is oriented to person, place, and time.  Cachetic woman lying comfortably in bed, in no acute distress  Cardiovascular: Normal rate and regular rhythm.   Pulmonary/Chest: Effort normal and breath sounds normal.  Musculoskeletal:  Bilateral lower legs covered with clean bandages  Neurological: She is alert and oriented to person, place, and time.  Psychiatric: She has a normal mood and affect. Her behavior is normal.   CBC Latest Ref Rng & Units 10/15/2016 10/14/2016 10/08/2016  WBC 4.0 - 10.5 K/uL 10.0 11.2(H) 7.3  Hemoglobin 12.0 - 15.0 g/dL 11.2(L) 9.5(L) 10.5(L)  Hematocrit 36.0 - 46.0 % 34.1(L) 28.8(L) 31.8(L)  Platelets 150 - 400 K/uL 440(H) 343 328   BMP Latest Ref Rng & Units 10/18/2016 10/15/2016 10/14/2016  Glucose 65 - 99 mg/dL 97 155(H) 94  BUN 6 - 20 mg/dL 21(H) 17 17  Creatinine 0.44 - 1.00 mg/dL 0.98 1.24(H) 0.98  BUN/Creat Ratio 9 - 23 - - -  Sodium 135 - 145 mmol/L 139 137 138  Potassium 3.5 - 5.1 mmol/L 4.0 4.0 4.1  Chloride 101 - 111 mmol/L 108 105 106  CO2 22 - 32 mmol/L 24 23 25   Calcium 8.9 - 10.3 mg/dL 8.8(L) 9.1 9.0    Assessment/Plan:  Principal Problem:   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 female with history of cocaine abuse, cocaine-associated (levimasole) vasculitis,  and chronic lower extremity wounds due to the aforementioned who presents with worsening pain in her chronic wounds.  Her pain has worsened after running out of opiates, and he wounds seem to be stable and not infected.  Surgically debrided on 1/16, now medically ready for discharge.  #Chronic Wounds #Levimasole Vasculitis #Cocaine Abuse -Counseled regarding risks of cocaine abuse and importance of cessation -Wound care, daily dressing changes -Oxy 10 Q4H PRN, Ibuprofen for pain  #HTN #Tachycardia Prescribed lisinopril 20 mg at home, noncompliant. -Lisinopril 20 mg daily -Hydralazine 2 mg Q6H PRN for SBP >180, DBP >100  Fluids: none Diet: regular DVT Prophylaxis: lovenox Code Status: full  Dispo: Anticipated discharge today to SNF for rehab and wound care.  Minus Liberty, MD 10/23/2016, 12:28 PM Pager: [phone]  Internal Medicine Attending:   I saw and examined the patient. I reviewed the resident's note and I agree with the resident's findings and plan as documented in the resident's note. D/C to SNF today.  Lucious Groves, DO  10/23/16 4:00 PM

## 2016-10-24 ENCOUNTER — Other Ambulatory Visit: Payer: Self-pay

## 2016-10-24 DIAGNOSIS — Z886 Allergy status to analgesic agent status: Secondary | ICD-10-CM

## 2016-10-24 MED ORDER — OXYCODONE HCL 5 MG PO TABS
10.0000 mg | ORAL_TABLET | Freq: Once | ORAL | Status: AC
  Administered 2016-10-24: 10 mg via ORAL

## 2016-10-24 MED ORDER — HYDROCORTISONE 1 % EX CREA
1.0000 "application " | TOPICAL_CREAM | Freq: Three times a day (TID) | CUTANEOUS | Status: DC | PRN
  Administered 2016-10-24: 1 via TOPICAL
  Filled 2016-10-24: qty 28

## 2016-10-24 MED ORDER — DIPHENHYDRAMINE HCL 25 MG PO CAPS
25.0000 mg | ORAL_CAPSULE | Freq: Once | ORAL | Status: AC
Start: 2016-10-24 — End: 2016-10-24
  Administered 2016-10-24: 25 mg via ORAL
  Filled 2016-10-24: qty 1

## 2016-10-24 NOTE — Progress Notes (Signed)
Was called in by patient who was sobbing stating she couldn't get up because the pain in her legs was too bad she wouldn't be able to get home tonight. I notified the MD the situation she said she would call her and call me back.

## 2016-10-24 NOTE — Progress Notes (Signed)
Pt refused dressing change at 0500. Pt stated "I'll do it later." Will endorse appropriately.

## 2016-10-24 NOTE — Clinical Social Work Note (Signed)
At this time pt has no bed offers. CSW spoke with pt at bedside. Pt reports she gets [facility]. Pt reports she also has help from her brother. Pt has decided to return home. MD notified. MD to put in a consult for Case Management. Clinical Social Worker will sign off for now as social work intervention is no longer needed. Please consult [place] again if new need arises.   Gerrianne Scale [place] 10/24/2016

## 2016-10-24 NOTE — Progress Notes (Signed)
Followed up with patient about her ride, she said she hadn't heard back from anyone and that she would ride the bus probably instead. I gave her d/c paperwork, from my end she is ready for d/c just awaiting ride situation.

## 2016-10-24 NOTE — Progress Notes (Signed)
Followed up with patient about ride, she said she is just going to take the bus, I gave her all her belongings that were in the closet so she could get ready. I gave her some wound supplies to get through until Monday when home wound care will come out.

## 2016-10-24 NOTE — Progress Notes (Signed)
Was called back by Md, she said they spoke and worked out that she would get her dose of oxycodone now and that we would get a taxi for her and wheel her down to [place], then once she got home she had a good support system who would help get her in the house once she got there.

## 2016-10-24 NOTE — Progress Notes (Signed)
Patient requesting to do her own dressing change, I gave her all necessary supplies.

## 2016-10-24 NOTE — Progress Notes (Signed)
   Subjective: Legs still in pain, complains of itching, not progressing with PT.  States if unable to get to rehab facility today that she needs to go home to take care of some business with her house and brother, [name].  She is asking for Benadryl due to itching.  Discussed importance of discharge to SNF for rehab and wound care.  Objective:  Vital signs in last 24 hours: Vitals:   10/23/16 0618 10/23/16 1315 10/23/16 2137 10/24/16 0448  BP: (!) 164/92 (!) 147/59 (!) 141/98 (!) 166/73  Pulse: 73 64 78   Resp: 18 19 18 18   Temp: 98.8 F (37.1 C) 98.6 F (37 C) 98.1 F (36.7 C) 97.4 F (36.3 C)  TempSrc: Oral Oral Oral Oral  SpO2: 100% 100% 100% 99%  Weight:      Height:       Physical Exam  Constitutional: She is oriented to person, place, and time.  Cachetic woman lying comfortably in bed, in no acute distress  Pulmonary/Chest: Effort normal.  Musculoskeletal:  Bilateral lower legs covered with clean bandages  Neurological: She is alert and oriented to person, place, and time.  Psychiatric: She has a normal mood and affect. Her behavior is normal.   CBC Latest Ref Rng & Units 10/15/2016 10/14/2016 10/08/2016  WBC 4.0 - 10.5 K/uL 10.0 11.2(H) 7.3  Hemoglobin 12.0 - 15.0 g/dL 11.2(L) 9.5(L) 10.5(L)  Hematocrit 36.0 - 46.0 % 34.1(L) 28.8(L) 31.8(L)  Platelets 150 - 400 K/uL 440(H) 343 328   BMP Latest Ref Rng & Units 10/18/2016 10/15/2016 10/14/2016  Glucose 65 - 99 mg/dL 97 155(H) 94  BUN 6 - 20 mg/dL 21(H) 17 17  Creatinine 0.44 - 1.00 mg/dL 0.98 1.24(H) 0.98  BUN/Creat Ratio 9 - 23 - - -  Sodium 135 - 145 mmol/L 139 137 138  Potassium 3.5 - 5.1 mmol/L 4.0 4.0 4.1  Chloride 101 - 111 mmol/L 108 105 106  CO2 22 - 32 mmol/L 24 23 25   Calcium 8.9 - 10.3 mg/dL 8.8(L) 9.1 9.0    Assessment/Plan:  Principal Problem:   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  Her pain has worsened after running out of opiates, and he wounds seem to be stable and not infected.  Surgically debrided on 1/16, now medically ready for discharge.  #Chronic Wounds #Levimasole Vasculitis #Cocaine Abuse -Counseled regarding risks of cocaine abuse and importance of cessation -Wound care, daily dressing changes -Oxy 10 Q4H PRN, Ibuprofen for pain -She may require chronic opiates for chronic wounds at discharge -PT Eval appreciated -Discuss with CSW re: status of SNF  #HTN #Tachycardia Prescribed lisinopril 20 mg at home, noncompliant. -Lisinopril 20 mg daily  Fluids: none Diet: regular DVT Prophylaxis: lovenox Code Status: full  Dispo: Anticipated discharge in 0-1 days.   To SNF for rehab and wound care.  Jule Ser, DO 10/24/2016, 8:28 AM Pager: [phone]

## 2016-10-24 NOTE — Care Management Note (Signed)
Case Management Note  Patient Details  Name: KENA LIMON MRN: [id number] Date of Birth: [birth year]  Subjective/Objective:                  Ulcer of lower extremity with fat layer exposed [facility]) Action/Plan: Discharge planning Expected Discharge Date:  10/24/16               Expected Discharge Plan:  [place]  In-House Referral:     Discharge planning Services  CM Consult  Post Acute Care Choice:  Home Health Choice offered to:  Patient  DME Arranged:  N/A DME Agency:  NA  HH Arranged:  RN [place] Agency:  [place]  Status of Service:  Completed, signed off  If discussed at [place] of Stay Meetings, dates discussed:    Additional Comments: CM spoke with pt to explain [facility] has withdrawn their care for non-compliance but if pt provided an acceptable environment with compliance, [facility] is willing to accept referral; pt agrees.  [facility] will not continue care if the environment is unsuitable or the pt is noncompliant. Referral called to [facility] rep, Jermaine for [facility] for wound management and dressing supplies. No other CM needs were communicated. Dellie Catholic, RN 10/24/2016, 2:47 PM

## 2016-10-24 NOTE — Progress Notes (Signed)
Notified patient she was being d/c and she said she would call her ride and see when they could pick her up.

## 2016-10-29 ENCOUNTER — Encounter (HOSPITAL_COMMUNITY): Payer: Self-pay

## 2016-10-29 ENCOUNTER — Emergency Department (HOSPITAL_COMMUNITY)
Admission: EM | Admit: 2016-10-29 | Discharge: 2016-10-29 | Disposition: A | Discharge status: Home / Self Care | Payer: Medicaid Other | Attending: Emergency Medicine | Admitting: Emergency Medicine

## 2016-10-29 DIAGNOSIS — Z79899 Other long term (current) drug therapy: Secondary | ICD-10-CM | POA: Insufficient documentation

## 2016-10-29 DIAGNOSIS — M79604 Pain in right leg: Secondary | ICD-10-CM | POA: Insufficient documentation

## 2016-10-29 DIAGNOSIS — M79605 Pain in left leg: Secondary | ICD-10-CM | POA: Insufficient documentation

## 2016-10-29 DIAGNOSIS — F17210 Nicotine dependence, cigarettes, uncomplicated: Secondary | ICD-10-CM | POA: Insufficient documentation

## 2016-10-29 DIAGNOSIS — I10 Essential (primary) hypertension: Secondary | ICD-10-CM | POA: Diagnosis not present

## 2016-10-29 DIAGNOSIS — G8929 Other chronic pain: Secondary | ICD-10-CM | POA: Diagnosis not present

## 2016-10-29 LAB — COMPREHENSIVE METABOLIC PANEL
ALBUMIN: 3.4 g/dL — AB (ref 3.5–5.0)
ALT: 11 U/L — ABNORMAL LOW (ref 14–54)
AST: 14 U/L — AB (ref 15–41)
Alkaline Phosphatase: 62 U/L (ref 38–126)
Anion gap: 11 (ref 5–15)
BUN: 16 mg/dL (ref 6–20)
CO2: 22 mmol/L (ref 22–32)
CREATININE: 0.96 mg/dL (ref 0.44–1.00)
Calcium: 9.6 mg/dL (ref 8.9–10.3)
Chloride: 108 mmol/L (ref 101–111)
GFR calc Af Amer: >60 mL/min (ref >60)
GFR calc non Af Amer: >60 mL/min (ref >60)
Glucose, Bld: 129 mg/dL — ABNORMAL HIGH (ref 65–99)
Potassium: 3.7 mmol/L (ref 3.5–5.1)
SODIUM: 141 mmol/L (ref 135–145)
Total Bilirubin: 0.4 mg/dL (ref 0.3–1.2)
Total Protein: 7 g/dL (ref 6.5–8.1)

## 2016-10-29 LAB — CBC WITH DIFFERENTIAL/PLATELET
Basophils Absolute: 0 10*3/uL (ref 0.0–0.1)
Basophils Relative: 0 %
EOS ABS: 0.1 10*3/uL (ref 0.0–0.7)
EOS PCT: 1 %
HCT: 31.5 % — ABNORMAL LOW (ref 36.0–46.0)
HEMOGLOBIN: 10.4 g/dL — AB (ref 12.0–15.0)
LYMPHS ABS: 1.8 10*3/uL (ref 0.7–4.0)
LYMPHS PCT: 21 %
MCH: 27.8 pg (ref 26.0–34.0)
MCHC: 33 g/dL (ref 30.0–36.0)
MCV: 84.2 fL (ref 78.0–100.0)
MONOS PCT: 6 %
Monocytes Absolute: 0.5 10*3/uL (ref 0.1–1.0)
Neutro Abs: 5.9 10*3/uL (ref 1.7–7.7)
Neutrophils Relative %: 72 %
Platelets: 532 10*3/uL — ABNORMAL HIGH (ref 150–400)
RBC: 3.74 MIL/uL — ABNORMAL LOW (ref 3.87–5.11)
RDW: 15.1 % (ref 11.5–15.5)
WBC: 8.3 10*3/uL (ref 4.0–10.5)

## 2016-10-29 LAB — I-STAT CG4 LACTIC ACID, ED
LACTIC ACID, VENOUS: 0.65 mmol/L (ref 0.5–1.9)
LACTIC ACID, VENOUS: 0.88 mmol/L (ref 0.5–1.9)

## 2016-10-29 NOTE — ED Notes (Signed)
Sprite given

## 2016-10-29 NOTE — Discharge Instructions (Signed)
Make an appointment to follow-up with your primary physician in clinic.

## 2016-10-29 NOTE — ED Provider Notes (Signed)
[facility] DEPT Provider Note   CSN: [id number] Arrival date & time: 10/29/16  1232     History   Chief Complaint No chief complaint on file.   HPI [name] is a 56 y.o. female.  HPI patient has chronic bilateral lower extremity ulcerations due to vasculitis from cocaine abuse. Recently hospitalized and had wounds debrided. Wound care was arranged as outpatient. Patient was given oxycodone for chronic pain management. She states the pain worsened today. She came to the emergency department. She denies running out of her oxycodone at home. She's had no fever or chills. She has not contacted her primary care doctor. Denies recent cocaine use.  Past Medical History:  Diagnosis Date  . CAP (community acquired pneumonia) 03/2014 X 2  . Cocaine abuse    ongoing with resultant vaculitis.  . Depression   . Headache    "weekly" (07/29/2016)  . Hypertension   . Inflammatory arthritis   . Migraines    "probably 5-6/yr" (07/29/2016)  . Normocytic anemia    BL Hgb 9.8-12. Last anemia panel 04/2010 - showing Fe 19, ferritin 101.  Pt on monthly B12 injections  . Rheumatoid arthritis(714.0)    patient reported  . 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 Followed by Dr. Louanne Skye    Patient Active Problem List   Diagnosis Date Noted  . Ulcer of lower extremity with fat layer exposed ([place]) 10/14/2016  . Lower extremity ulceration ([place]) 10/14/2016  . Acute kidney injury ([place]) 09/09/2016  . Palliative care encounter   . Wound drainage   . Infectious myositis   . Wound dehiscence   . Finger necrosis ([place]) 05/18/2016  . AKI (acute kidney injury) ([place])   . Leg ulcer ([place])   . 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 ([place]) 06/04/2015  . Pressure ulcer 04/22/2015  . Skin necrosis ([place]) 04/21/2015  . Protein-calorie malnutrition, severe ([place]) 01/15/2015  . MDD (major depressive disorder), recurrent episode, severe ([place]) 10/16/2014  .  Cocaine use disorder, severe, dependence ([place]) 10/10/2014  . Cannabis use disorder, severe, dependence ([place]) 10/10/2014  . Substance induced mood disorder ([place]) 10/09/2014  . 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 Vasculitis ([place]) 04/19/2014  . Hepatomegaly 04/11/2014  . Acute pericarditis 03/31/2014  . Inflammatory arthritis 09/24/2013  . 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 Tobacco abuse 01/03/2013  . hyperlipidemia 01/03/2013  . Essential hypertension 02/26/2010    Past Surgical History:  Procedure Laterality Date  . AMPUTATION Left 05/22/2016   Procedure: AMPUTATION LEFT LONG FINGER;  Surgeon: Marybelle Killings, MD;  Location: [facility];  Service: Orthopedics;  Laterality: Left;  . HERNIA REPAIR     "stomach"  . I&D EXTREMITY Right 09/26/2015   Procedure: IRRIGATION AND DEBRIDEMENT LEG WOUND  VAC PLACEMENT.;  Surgeon: Loel Lofty Dillingham, DO;  Location: [facility];  Service: Plastics;  Laterality: Right;  . INCISION AND DRAINAGE OF WOUND Bilateral 10/20/2016   Procedure: IRRIGATION AND DEBRIDEMENT WOUND BILATERAL;  Surgeon: Edrick Kins, DPM;  Location: [facility];  Service: Podiatry;  Laterality: Bilateral;  . IRRIGATION AND DEBRIDEMENT ABSCESS Bilateral 09/26/2013   Procedure: DEBRIDEMENT ULCERS BILATERAL THIGHS;  Surgeon: Gwenyth Ober, MD;  Location: [facility];  Service: General;  Laterality: Bilateral;  . SKIN BIOPSY Bilateral    shin nodules    OB History    No data available       Home Medications    Prior to Admission medications  Medication Sig Start Date End Date Taking? Authorizing Provider  collagenase (SANTYL) ointment Apply topically daily. 10/23/16   Minus Liberty, MD  feeding supplement (BOOST / RESOURCE BREEZE) LIQD Take 1 Container by mouth 3 (three) times daily between meals. 10/23/16   Minus Liberty, MD  ibuprofen (ADVIL,MOTRIN) 600 MG tablet Take 1 tablet (600 mg total) by mouth every 8 (eight) hours as needed for mild pain or moderate pain.  10/23/16   Minus Liberty, MD  lisinopril (PRINIVIL,ZESTRIL) 20 MG tablet Take 1 tablet (20 mg total) by mouth daily. 10/23/16   Minus Liberty, MD  Multiple Vitamins-Iron (MULTIVITAMINS WITH IRON) TABS tablet Take 1 tablet by mouth daily. 10/24/16   Minus Liberty, MD  Oxycodone HCl 10 MG TABS Take 1 tablet (10 mg total) by mouth every 4 (four) hours as needed. 10/23/16   Minus Liberty, MD  senna (SENOKOT) 8.6 MG TABS tablet Take 1 tablet (8.6 mg total) by mouth daily as needed for mild constipation. 10/23/16   Minus Liberty, MD    Family History Family History  Problem Relation Age of Onset  . Breast cancer Mother     Breast cancer  . Alcohol abuse Mother   . Colon cancer Maternal Aunt 90  . Alcohol abuse Father     Social History Social History  Substance Use Topics  . Smoking status: Current Every Day Smoker    Packs/day: 0.12    Years: 38.00    Types: Cigarettes  . Smokeless tobacco: Never Used  . Alcohol use No     Allergies   Acetaminophen and Other   Review of Systems Review of Systems  Constitutional: Negative for chills and fever.  Respiratory: Negative for shortness of breath.   Cardiovascular: Negative for chest pain.  Gastrointestinal: Negative for abdominal pain and nausea.  Skin: Positive for wound.  Neurological: Negative for weakness, numbness and headaches.  All other systems reviewed and are negative.    Physical Exam Updated Vital Signs BP 173/90   Pulse 96   Temp 98.4 F (36.9 C) (Oral)   Resp 16   SpO2 100%   Physical Exam  Constitutional: She is oriented to person, place, and time. She appears well-developed and well-nourished.  Patient is resting comfortably as I enter the room. She quickly becomes tearful and crying loudly. Chronically ill-appearing  HENT:  Head: Normocephalic and atraumatic.  Mouth/Throat: Oropharynx is clear and moist. No oropharyngeal exudate.  Eyes: EOM are normal. Pupils are equal, round, and  reactive to light.  Neck: Normal range of motion. Neck supple.  Cardiovascular: Normal rate and regular rhythm.  Exam reveals no gallop and no friction rub.   No murmur heard. Pulmonary/Chest: Effort normal and breath sounds normal. No respiratory distress. She has no wheezes. She has no rales. She exhibits no tenderness.  Abdominal: Soft. Bowel sounds are normal. There is no tenderness. There is no rebound and no guarding.  Musculoskeletal: Normal range of motion. She exhibits no edema or tenderness.  Lower extremities are wrapped in clean intact bandages.  Neurological: She is alert and oriented to person, place, and time.  Moving all extremities without focal deficit. Sensation intact.  Skin: Skin is warm and dry. No rash noted. No erythema.  Psychiatric: She has a normal mood and affect. Her behavior is normal.  Nursing note and vitals reviewed.    ED Treatments / Results  Labs (all labs ordered are listed, but only abnormal results are displayed) Labs Reviewed  COMPREHENSIVE METABOLIC PANEL - Abnormal; Notable  for the following:       Result Value   Glucose, Bld 129 (*)    Albumin 3.4 (*)    AST 14 (*)    ALT 11 (*)    All other components within normal limits  CBC WITH DIFFERENTIAL/PLATELET - Abnormal; Notable for the following:    RBC 3.74 (*)    Hemoglobin 10.4 (*)    HCT 31.5 (*)    Platelets 532 (*)    All other components within normal limits  I-STAT CG4 LACTIC ACID, ED  I-STAT CG4 LACTIC ACID, ED    EKG  EKG Interpretation None       Radiology No results found.  Procedures Procedures (including critical care time)  Medications Ordered in ED Medications - No data to display   Initial Impression / Assessment and Plan / ED Course  I have reviewed the triage vital signs and the nursing notes.  Pertinent labs & imaging results that were available during my care of the patient were reviewed by me and considered in my medical decision making (see chart  for details).    We'll take down dressings to reevaluate chronic wounds. Blood work with normal lactic acid and white blood cell count. Patient is afebrile.   Dressings removed and ulcerations evaluated. No evidence of infection. No purulent discharge or surrounding erythema. No bleeding.   Patient is calm and comfortable when provider staff are not in the room. We'll touch base with the patient's primary care doctor to arrange follow-up. Do not believe that acute treatment is necessary at this point.  Discussed with internal medicine. Will arrange follow-up in clinic. Final Clinical Impressions(s) / ED Diagnoses   Final diagnoses:  Chronic pain of both lower extremities    New Prescriptions New Prescriptions   No medications on file     Julianne Rice, MD 10/29/16 1846

## 2016-10-29 NOTE — ED Triage Notes (Signed)
Patient here with bilateral ongoing lower leg pain, has chronic wounds to same due to vascuitis. Seen often for same. Denies recent drug use

## 2016-10-29 NOTE — ED Notes (Signed)
Pt stable, states understanding of discharge instructions

## 2016-10-29 NOTE — ED Notes (Signed)
Pt states she cant walk because of the pain but is able to when BLE do not hurt.

## 2016-11-05 ENCOUNTER — Encounter: Payer: Self-pay | Admitting: Internal Medicine

## 2016-11-05 ENCOUNTER — Ambulatory Visit (INDEPENDENT_AMBULATORY_CARE_PROVIDER_SITE_OTHER): Payer: Medicaid Other | Admitting: Internal Medicine

## 2016-11-05 DIAGNOSIS — Z89022 Acquired absence of left finger(s): Secondary | ICD-10-CM

## 2016-11-05 DIAGNOSIS — F14288 Cocaine dependence with other cocaine-induced disorder: Secondary | ICD-10-CM | POA: Diagnosis not present

## 2016-11-05 DIAGNOSIS — Z79899 Other long term (current) drug therapy: Secondary | ICD-10-CM

## 2016-11-05 DIAGNOSIS — I10 Essential (primary) hypertension: Secondary | ICD-10-CM | POA: Diagnosis not present

## 2016-11-05 DIAGNOSIS — L958 Other vasculitis limited to the skin: Secondary | ICD-10-CM | POA: Diagnosis present

## 2016-11-05 DIAGNOSIS — F17210 Nicotine dependence, cigarettes, uncomplicated: Secondary | ICD-10-CM | POA: Diagnosis not present

## 2016-11-05 DIAGNOSIS — L97203 Non-pressure chronic ulcer of unspecified calf with necrosis of muscle: Secondary | ICD-10-CM

## 2016-11-05 MED ORDER — OXYCODONE HCL 10 MG PO TABS
10.0000 mg | ORAL_TABLET | Freq: Four times a day (QID) | ORAL | 0 refills | Status: DC | PRN
Start: 2016-11-05 — End: 2016-12-10

## 2016-11-05 MED ORDER — LISINOPRIL 40 MG PO TABS: 40.0000 mg | ORAL_TABLET | Freq: Every day | ORAL | 11 refills | Status: DC

## 2016-11-05 MED ORDER — IBUPROFEN 600 MG PO TABS: 600.0000 mg | ORAL_TABLET | Freq: Three times a day (TID) | ORAL | 11 refills | Status: DC | PRN

## 2016-11-05 NOTE — Patient Instructions (Signed)
Take the oxycodone only as needed for severe, unbearable pain. Take ibuprofen 600 mg every 8 hours as needed for pain.   Take the new dose of lisinopril 40 mg once a day for blood pressure.   Continue wound dressing changes.

## 2016-11-05 NOTE — Assessment & Plan Note (Signed)
BP Readings from Last 3 Encounters:  11/05/16 (!) 151/87  10/29/16 (!) 208/134  10/24/16 134/73    Lab Results  Component Value Date   NA 141 10/29/2016   K 3.7 10/29/2016   CREATININE 0.96 10/29/2016    Assessment: Blood pressure control:  Uncontrolled Progress toward BP goal:   Deteriorated Comments: Reports compliance with lisinopril 20 mg QD. Likely elevated d/t pain- but likely always elevated due to pain. Controlled during inpatient admission, however, [name] was on IV narcotics at this time.   Plan: Medications:  Increase lisinopril to 40 mg QD.  Other plans: Follow up in one month.

## 2016-11-05 NOTE — Progress Notes (Signed)
Medicine attending: Medical history, presenting problems, physical findings, and medications, reviewed with resident physician Dr Martyn Malay on the day of the patient visit and I concur with her evaluation and management plan.

## 2016-11-05 NOTE — Assessment & Plan Note (Signed)
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 Patient was recently admitted and discharged on 10/26/16 for acute on chronic pain from her wounds several days after running out of oxycodone. Per discharge summary, patient was unable to walk around at home to take care of herself. On admission she was afebrile, without leukocytosis, no purulent drainage or odor from wounds, and not other signs/symptoms of systemic infection. On 1/16, her wounds were surgically debrided. Patient was given IV opioids while admitted and discharged with 30 pills of Oxycodone. She has been taking this every 4 hours for pain but still has 2 left over. She states they help her function around her house and perform her ADLs. However, the pain has recently worsened, she is tearful on exam. She denies any change in her wounds- no increased drainage, erythema or extension of ulceration. She denies fever, chills, nausea or vomiting.  [facility] has been coming out every other day for dressing changes. She just had her dressings changed prior to coming here and requests that we do not take them off due to the severity of the pain. On exam, patient has chronic left lower extremity wound, well wrapped without drainage or odor. Right lower extremity wounds- well wrapped, with obvious drainage through bandage but without odor. 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   Plan: -Continue dressing changes -Keep wounds clean and dry -Stop using cocaine -Follow up with wound care clinic -Will prescribe oxycodone for acute pain (allergy to tylenol) for 3 days ONLY #12 pills -Emphasize use of Ibuprofen 600 mg Q8H prn pain and oxycodone only for severe unbearable pain

## 2016-11-05 NOTE — Progress Notes (Signed)
    CC: Chronic Lower Extremity Wounds  HPI: [name] is a 56 y.o. 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 Patient was recently admitted and discharged on 10/26/16 for acute on chronic pain from her wounds several days after running out of oxycodone. Per discharge summary, patient was unable to walk around at home to take care of herself. On admission she was afebrile, without leukocytosis, no purulent drainage or odor from wounds, and not other signs/symptoms of systemic infection. On 1/16, her wounds were surgically debrided. Patient was given IV opioids while admitted and discharged with 30 pills of Oxycodone. She has been taking this every 4 hours for pain but still has 2 left over. She states they help her function around her house and perform her ADLs. However, the pain has recently worsened, she is tearful on exam. She denies any change in her wounds- no increased drainage, erythema or extension of ulceration. She denies fever, chills, nausea or vomiting.  [facility] has been coming out every other day for dressing changes. She just had her dressings changed prior to coming here and requests that we do not take them off due to the severity of the pain.   Past Medical History:  Diagnosis Date  . CAP (community acquired pneumonia) 03/2014 X 2  . Cocaine abuse    ongoing with resultant vaculitis.  . Depression   . Headache    "weekly" (07/29/2016)  . Hypertension   . Inflammatory arthritis   . Migraines    "probably 5-6/yr" (07/29/2016)  . Normocytic anemia    BL Hgb 9.8-12. Last anemia panel 04/2010 - showing Fe 19, ferritin 101.  Pt on monthly B12 injections  . Rheumatoid arthritis(714.0)    patient reported  . VASCULITIS  04/17/2010   2/2 levimasole toxicity vs autoimmune d/o   ;  2/2 Levimasole toxicity. Followed by Dr. Louanne Skye    Review of Systems: Please see pertinent ROS reviewed in HPI and problem based charting.   Physical Exam: Vitals:   11/05/16 1329 11/05/16 1349  BP: (!) 174/78 (!) 151/87  Pulse: (!) 109 (!) 110  Temp: 98.6 F (37 C)   TempSrc: Oral   SpO2: 100%   Weight: 88 lb 8 oz (40.1 kg)   Height: 5' 1"  (1.549 m)    General: Vital signs reviewed.  Patient is chronically ill appearring, in moderate acute distress and tearful on exam Cardiovascular: Tachycardic, regular rhythm, S1 normal, S2 normal, no murmurs, gallops, or rubs. Pulmonary/Chest: Clear to auscultation bilaterally, no wheezes, rales, or rhonchi. Abdominal: Soft, non-tender, non-distended Extremities: No lower extremity edema bilaterally, chronic left lower extremity wound, well wrapped without drainage or odor. Right lower extremity wounds- well wrapped, with obvious drainage through bandage but without odor. 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 Skin: Chronic vasculitis wounds on ears- healed Psychiatric: Tearful, anxious.   Assessment & Plan:  See encounters tab for problem based medical decision making. Patient discussed with Dr. Beryle Beams.

## 2016-11-09 ENCOUNTER — Telehealth: Payer: Self-pay | Admitting: Internal Medicine

## 2016-11-09 NOTE — Telephone Encounter (Signed)
[facility] Nurse notifying PC that patient missed a visit today. If you have any questions please contact her directly at the number listed.

## 2016-11-12 ENCOUNTER — Telehealth: Payer: Self-pay

## 2016-11-12 NOTE — Telephone Encounter (Signed)
FYI

## 2016-11-12 NOTE — Telephone Encounter (Signed)
[name] from [facility] states she is unable to reach pt and missed a visit for this week.

## 2016-12-08 ENCOUNTER — Telehealth: Payer: Self-pay | Admitting: Internal Medicine

## 2016-12-08 NOTE — Telephone Encounter (Signed)
APT. REMINDER CALL, LMTCB °

## 2016-12-09 ENCOUNTER — Encounter: Payer: Self-pay | Admitting: Internal Medicine

## 2016-12-10 ENCOUNTER — Ambulatory Visit (INDEPENDENT_AMBULATORY_CARE_PROVIDER_SITE_OTHER): Payer: Medicaid Other | Admitting: Internal Medicine

## 2016-12-10 ENCOUNTER — Telehealth: Payer: Self-pay | Admitting: Internal Medicine

## 2016-12-10 VITALS — BP 198/103 | HR 96 | Temp 97.9°F | Ht 61.5 in | Wt 85.2 lb

## 2016-12-10 DIAGNOSIS — Z79891 Long term (current) use of opiate analgesic: Secondary | ICD-10-CM | POA: Diagnosis not present

## 2016-12-10 DIAGNOSIS — I999 Unspecified disorder of circulatory system: Secondary | ICD-10-CM

## 2016-12-10 DIAGNOSIS — L958 Other vasculitis limited to the skin: Secondary | ICD-10-CM

## 2016-12-10 DIAGNOSIS — Z79899 Other long term (current) drug therapy: Secondary | ICD-10-CM | POA: Diagnosis not present

## 2016-12-10 DIAGNOSIS — I10 Essential (primary) hypertension: Secondary | ICD-10-CM | POA: Diagnosis not present

## 2016-12-10 DIAGNOSIS — F14288 Cocaine dependence with other cocaine-induced disorder: Secondary | ICD-10-CM

## 2016-12-10 DIAGNOSIS — F14988 Cocaine use, unspecified with other cocaine-induced disorder: Secondary | ICD-10-CM

## 2016-12-10 MED ORDER — OXYCODONE HCL 10 MG PO TABS: 10.0000 mg | ORAL_TABLET | Freq: Four times a day (QID) | ORAL | 0 refills | Status: DC | PRN

## 2016-12-10 MED ORDER — CLONIDINE HCL 0.1 MG PO TABS: 0.2000 mg | ORAL_TABLET | Freq: Once | ORAL | Status: DC

## 2016-12-10 NOTE — Telephone Encounter (Signed)
Pt missed appt yesterday. Called wanting to come in  asap upset said there was issue with her wound meat hanging off leg. Offered 10:15 [name] would call back if she could get a ride. haven't heard back so put in a message for nurse to call.

## 2016-12-10 NOTE — Telephone Encounter (Signed)
Seen in clinic today.

## 2016-12-10 NOTE — Patient Instructions (Signed)
Refilled your oxycodone. You must go to the wound care.  Follow up in 1 week.

## 2016-12-10 NOTE — Assessment & Plan Note (Signed)
Patient is here today in continued pain from her vasculitis wounds on both legs. Has not seen wound care since last visit.  - gave her 12 more tablets of oxycodone for her pain along with continuing ibuprofen - asked her to see wound care clinic.

## 2016-12-10 NOTE — Progress Notes (Signed)
Medicine attending: Medical history, presenting problems, physical findings, and medications, reviewed with resident physician Dr Dellia Nims on the day of the patient visit and I concur with his evaluation and management plan.

## 2016-12-10 NOTE — Assessment & Plan Note (Signed)
Vitals:   12/10/16 1120  BP: (!) 198/103  Pulse: 96  Temp: 97.9 F (36.6 C)   BP elevated 2/2 to not taking her meds and likely from pain. Could also be from cocaine al though she denies any recent use.  - gave 1x clonidine 0.2m PO in the clinic and reminded her to pick up the lisinopril from the pharmacy (already has refill) along with her pain medication today.  -f/up in 1 week.

## 2016-12-10 NOTE — Progress Notes (Signed)
   CC: pain, BP f/up  HPI:  [name] is a 56 y.o. with PMh as listed below is here with her chronic pain from her leg wounds 2/2 to levimasole induced vasculitis. Has not seen wound care yet. Ran out of her oxycodone 12 tablets, requesting more oxycodone and ibuprofen.   BP is high, has not taken her lisinopril for last few days as she ran out and did not know that she can get refill from her pharmacy. No vision changes, numbness, headache.   Past Medical History:  Diagnosis Date  . CAP (community acquired pneumonia) 03/2014 X 2  . Cocaine abuse    ongoing with resultant vaculitis.  . Depression   . Headache    "weekly" (07/29/2016)  . Hypertension   . Inflammatory arthritis   . Migraines    "probably 5-6/yr" (07/29/2016)  . Normocytic anemia    BL Hgb 9.8-12. Last anemia panel 04/2010 - showing Fe 19, ferritin 101.  Pt on monthly B12 injections  . Rheumatoid arthritis(714.0)    patient reported  . 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 Followed by Dr. Louanne Skye    Review of Systems:   Review of Systems  Constitutional: Negative for chills and fever.  Cardiovascular: Negative for chest pain and palpitations.  Musculoskeletal: Positive for joint pain.     Physical Exam:  Vitals:   12/10/16 1120  BP: (!) 198/103  Pulse: 96  Temp: 97.9 F (36.6 C)  TempSrc: Oral  SpO2: 100%  Weight: 85 lb 3.2 oz (38.6 kg)  Height: 5' 1.5" (1.562 m)   Physical Exam  Constitutional:  Intermittently tearful complaining of the pain.   Cardiovascular: Normal rate and regular rhythm.  Exam reveals no friction rub.   No murmur heard. Respiratory: Effort normal and breath sounds normal. No respiratory distress.  Musculoskeletal:  Has wounds throughout her lower leg covered with dressing. I did not undress them today.     Assessment & Plan:   See Encounters Tab for problem based charting.  Patient discussed with Dr.  Beryle Beams

## 2016-12-16 ENCOUNTER — Emergency Department (HOSPITAL_COMMUNITY)
Admission: EM | Admit: 2016-12-16 | Discharge: 2016-12-16 | Disposition: A | Discharge status: Home / Self Care | Payer: Medicaid Other | Attending: Emergency Medicine | Admitting: Emergency Medicine

## 2016-12-16 ENCOUNTER — Encounter (HOSPITAL_COMMUNITY): Payer: Self-pay | Admitting: Emergency Medicine

## 2016-12-16 DIAGNOSIS — L97922 Non-pressure chronic ulcer of unspecified part of left lower leg with fat layer exposed: Secondary | ICD-10-CM

## 2016-12-16 DIAGNOSIS — F419 Anxiety disorder, unspecified: Secondary | ICD-10-CM | POA: Diagnosis not present

## 2016-12-16 DIAGNOSIS — L97921 Non-pressure chronic ulcer of unspecified part of left lower leg limited to breakdown of skin: Secondary | ICD-10-CM | POA: Insufficient documentation

## 2016-12-16 DIAGNOSIS — I10 Essential (primary) hypertension: Secondary | ICD-10-CM | POA: Insufficient documentation

## 2016-12-16 DIAGNOSIS — F17210 Nicotine dependence, cigarettes, uncomplicated: Secondary | ICD-10-CM | POA: Insufficient documentation

## 2016-12-16 DIAGNOSIS — Z79899 Other long term (current) drug therapy: Secondary | ICD-10-CM | POA: Insufficient documentation

## 2016-12-16 DIAGNOSIS — L97912 Non-pressure chronic ulcer of unspecified part of right lower leg with fat layer exposed: Secondary | ICD-10-CM

## 2016-12-16 DIAGNOSIS — M79662 Pain in left lower leg: Secondary | ICD-10-CM | POA: Diagnosis present

## 2016-12-16 LAB — COMPREHENSIVE METABOLIC PANEL
ALBUMIN: 3.7 g/dL (ref 3.5–5.0)
ALK PHOS: 78 U/L (ref 38–126)
ALT: 11 U/L — AB (ref 14–54)
ANION GAP: 12 (ref 5–15)
AST: 18 U/L (ref 15–41)
BUN: 23 mg/dL — ABNORMAL HIGH (ref 6–20)
CALCIUM: 9.3 mg/dL (ref 8.9–10.3)
CO2: 22 mmol/L (ref 22–32)
CREATININE: 1.04 mg/dL — AB (ref 0.44–1.00)
Chloride: 105 mmol/L (ref 101–111)
GFR calc Af Amer: >60 mL/min (ref >60)
GFR calc non Af Amer: >60 mL/min (ref >60)
GLUCOSE: 180 mg/dL — AB (ref 65–99)
Potassium: 3.4 mmol/L — ABNORMAL LOW (ref 3.5–5.1)
SODIUM: 139 mmol/L (ref 135–145)
Total Bilirubin: 0.5 mg/dL (ref 0.3–1.2)
Total Protein: 6.7 g/dL (ref 6.5–8.1)

## 2016-12-16 LAB — CBC WITH DIFFERENTIAL/PLATELET
BASOS PCT: 0 %
Basophils Absolute: 0 10*3/uL (ref 0.0–0.1)
Eosinophils Absolute: 0.1 10*3/uL (ref 0.0–0.7)
Eosinophils Relative: 1 %
HEMATOCRIT: 32.5 % — AB (ref 36.0–46.0)
Hemoglobin: 10.7 g/dL — ABNORMAL LOW (ref 12.0–15.0)
LYMPHS ABS: 2.1 10*3/uL (ref 0.7–4.0)
Lymphocytes Relative: 18 %
MCH: 26.8 pg (ref 26.0–34.0)
MCHC: 32.9 g/dL (ref 30.0–36.0)
MCV: 81.5 fL (ref 78.0–100.0)
MONO ABS: 0.6 10*3/uL (ref 0.1–1.0)
Monocytes Relative: 5 %
NEUTROS ABS: 8.5 10*3/uL — AB (ref 1.7–7.7)
Neutrophils Relative %: 76 %
Platelets: 380 10*3/uL (ref 150–400)
RBC: 3.99 MIL/uL (ref 3.87–5.11)
RDW: 15.3 % (ref 11.5–15.5)
WBC: 11.2 10*3/uL — ABNORMAL HIGH (ref 4.0–10.5)

## 2016-12-16 LAB — URINALYSIS, ROUTINE W REFLEX MICROSCOPIC
BACTERIA UA: NONE SEEN
BILIRUBIN URINE: NEGATIVE
Glucose, UA: NEGATIVE mg/dL
Hgb urine dipstick: NEGATIVE
Ketones, ur: NEGATIVE mg/dL
Leukocytes, UA: NEGATIVE
Nitrite: NEGATIVE
Protein, ur: 30 mg/dL — AB
RBC / HPF: NONE SEEN RBC/hpf (ref 0–5)
Specific Gravity, Urine: 1.025 (ref 1.005–1.030)
pH: 5 (ref 5.0–8.0)

## 2016-12-16 LAB — PROTIME-INR
INR: 1
Prothrombin Time: 13.2 seconds (ref 11.4–15.2)

## 2016-12-16 LAB — I-STAT CG4 LACTIC ACID, ED
LACTIC ACID, VENOUS: 0.71 mmol/L (ref 0.5–1.9)
Lactic Acid, Venous: 2.3 mmol/L (ref 0.5–1.9)

## 2016-12-16 MED ORDER — LORAZEPAM 1 MG PO TABS
2.0000 mg | ORAL_TABLET | Freq: Once | ORAL | Status: AC
  Administered 2016-12-16: 2 mg via ORAL
  Filled 2016-12-16: qty 2

## 2016-12-16 NOTE — ED Triage Notes (Signed)
Per EMS: Patient presents to ED for assessment of lower left leg pain with chronic ulcers, increasing pain x 4 days.  Ulceration is almost to bone.  Pt tearful at triage.

## 2016-12-16 NOTE — ED Notes (Addendum)
Pt remains to be tearful upon arrival of this RN to the room. PT yelling out prior to RN walking into the room.

## 2016-12-16 NOTE — ED Notes (Signed)
Assisted patient with dressing and into wheelchair. Escorted to lobby and provided coffee. Cab voucher obtained and verified address with patient. Called [employer] to transport patient home.

## 2016-12-16 NOTE — ED Provider Notes (Signed)
[place] DEPT Provider Note   CSN: [id number] Arrival date & time: 12/16/16  0247     History   Chief Complaint Chief Complaint  Patient presents with  . Leg Pain   Patient gave verbal permission to utilize photo for medical documentation only The image was not stored on any personal device   HPI [name] is a 56 y.o. female.  The history is provided by the patient.  Leg Pain   This is a chronic problem. The current episode started more than 1 week ago. The problem occurs daily. The problem has been gradually worsening. The pain is present in the left lower leg and right lower leg. The quality of the pain is described as constant. The pain is severe. The symptoms are aggravated by contact. Treatments tried: wound care. The treatment provided no relief.  patient with h/o cocaine abuse, cocaine induced vasculitis with known chronic wounds to her legs, presents with worsening pain in her legs She reports fevers.  She reports she "is burning from the inside" She has wound clinic followup next week She reports she is anxious  Past Medical History:  Diagnosis Date  . CAP (community acquired pneumonia) 03/2014 X 2  . Cocaine abuse    ongoing with resultant vaculitis.  . Depression   . Headache    "weekly" (07/29/2016)  . Hypertension   . Inflammatory arthritis   . Migraines    "probably 5-6/yr" (07/29/2016)  . Normocytic anemia    BL Hgb 9.8-12. Last anemia panel 04/2010 - showing Fe 19, ferritin 101.  Pt on monthly B12 injections  . Rheumatoid arthritis(714.0)    patient reported  . 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 Followed by Dr. Louanne Skye    Patient Active Problem List   Diagnosis Date Noted  . 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 Protein-calorie malnutrition, severe ([place]) 01/15/2015  . MDD (major depressive disorder), recurrent episode, severe ([place]) 10/16/2014  . Cocaine use  disorder, severe, dependence ([place]) 10/10/2014  . Substance induced mood disorder ([place]) 10/09/2014  . Acute pericarditis 03/31/2014  . 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 Essential hypertension 02/26/2010    Past Surgical History:  Procedure Laterality Date  . AMPUTATION Left 05/22/2016   Procedure: AMPUTATION LEFT LONG FINGER;  Surgeon: Marybelle Killings, MD;  Location: [facility];  Service: Orthopedics;  Laterality: Left;  . HERNIA REPAIR     "stomach"  . I&D EXTREMITY Right 09/26/2015   Procedure: IRRIGATION AND DEBRIDEMENT LEG WOUND  VAC PLACEMENT.;  Surgeon: Loel Lofty Dillingham, DO;  Location: [facility];  Service: Plastics;  Laterality: Right;  . INCISION AND DRAINAGE OF WOUND Bilateral 10/20/2016   Procedure: IRRIGATION AND DEBRIDEMENT WOUND BILATERAL;  Surgeon: Edrick Kins, DPM;  Location: [facility];  Service: Podiatry;  Laterality: Bilateral;  . IRRIGATION AND DEBRIDEMENT ABSCESS Bilateral 09/26/2013   Procedure: DEBRIDEMENT ULCERS BILATERAL THIGHS;  Surgeon: Gwenyth Ober, MD;  Location: [facility];  Service: General;  Laterality: Bilateral;  . SKIN BIOPSY Bilateral    shin nodules    OB History    No data available       Home Medications    Prior to Admission medications   Medication Sig Start Date End Date Taking? Authorizing Provider  collagenase (SANTYL) ointment Apply topically daily. 10/23/16   Minus Liberty, MD  feeding supplement (BOOST / RESOURCE BREEZE) LIQD Take 1 Container by mouth  3 (three) times daily between meals. 10/23/16   Minus Liberty, MD  ibuprofen (ADVIL,MOTRIN) 600 MG tablet Take 1 tablet (600 mg total) by mouth every 8 (eight) hours as needed for mild pain or moderate pain. 11/05/16   Florinda Marker, MD  lisinopril (PRINIVIL,ZESTRIL) 40 MG tablet Take 1 tablet (40 mg total) by mouth daily. 11/05/16   Florinda Marker, MD  Multiple Vitamins-Iron (MULTIVITAMINS WITH IRON) TABS tablet Take 1 tablet by mouth daily. 10/24/16   Minus Liberty, MD    Oxycodone HCl 10 MG TABS Take 1 tablet (10 mg total) by mouth every 6 (six) hours as needed. 12/10/16   Dellia Nims, MD  senna (SENOKOT) 8.6 MG TABS tablet Take 1 tablet (8.6 mg total) by mouth daily as needed for mild constipation. 10/23/16   Minus Liberty, MD    Family History Family History  Problem Relation Age of Onset  . Breast cancer Mother     Breast cancer  . Alcohol abuse Mother   . Colon cancer Maternal Aunt 37  . Alcohol abuse Father     Social History Social History  Substance Use Topics  . Smoking status: Current Every Day Smoker    Packs/day: 0.12    Years: 38.00    Types: Cigarettes  . Smokeless tobacco: Never Used     Comment: 2 A DAY  . Alcohol use No     Allergies   Acetaminophen and Other   Review of Systems Review of Systems  Constitutional: Positive for fever.  Skin: Positive for wound.  Psychiatric/Behavioral: The patient is nervous/anxious.   All other systems reviewed and are negative.    Physical Exam Updated Vital Signs BP (!) 206/17 (BP Location: Left Arm)   Pulse 113   Temp 98.1 F (36.7 C) (Oral)   Resp 24   SpO2 100%   Physical Exam CONSTITUTIONAL:Anxious, teaful, yelling in pain HEAD: Normocephalic/atraumatic EYES: EOMI/PERRL ENMT: Mucous membranes moist NECK: supple no meningeal signs CV: S1/S2 noted, no murmurs/rubs/gallops noted LUNGS: Lungs are clear to auscultation bilaterally, no apparent distress ABDOMEN: soft, nontender GU:no cva tenderness NEURO: Pt is awake/alert/appropriate, moves all extremitiesx4.  No facial droop.   EXTREMITIES: pulses normal/equal, full ROM, no crepitus to lower extremities.  See photo below SKIN: warm, color normal PSYCH: anxious          ED Treatments / Results  Labs (all labs ordered are listed, but only abnormal results are displayed) Labs Reviewed  COMPREHENSIVE METABOLIC PANEL - Abnormal; Notable for the following:       Result Value   Potassium 3.4 (*)    Glucose,  Bld 180 (*)    BUN 23 (*)    Creatinine, Ser 1.04 (*)    ALT 11 (*)    All other components within normal limits  CBC WITH DIFFERENTIAL/PLATELET - Abnormal; Notable for the following:    WBC 11.2 (*)    Hemoglobin 10.7 (*)    HCT 32.5 (*)    Neutro Abs 8.5 (*)    All other components within normal limits  URINALYSIS, ROUTINE W REFLEX MICROSCOPIC - Abnormal; Notable for the following:    APPearance HAZY (*)    Protein, ur 30 (*)    Squamous Epithelial / LPF 0-5 (*)    All other components within normal limits  I-STAT CG4 LACTIC ACID, ED - Abnormal; Notable for the following:    Lactic Acid, Venous 2.30 (*)    All other components within normal limits  CULTURE, BLOOD (ROUTINE  X 2)  CULTURE, BLOOD (ROUTINE X 2)  PROTIME-INR  I-STAT CG4 LACTIC ACID, ED    EKG  EKG Interpretation None       Radiology No results found.  Procedures Procedures (including critical care time)  Medications Ordered in ED Medications  LORazepam (ATIVAN) tablet 2 mg (2 mg Oral Given 12/16/16 0559)     Initial Impression / Assessment and Plan / ED Course  I have reviewed the triage vital signs and the nursing notes.  Pertinent labs  results that were available during my care of the patient were reviewed by me and considered in my medical decision making (see chart for details).     6:23 AM Pt here in the ED for continued pain in her legs and chronic wounds This is not an acute issue Review of previous notes/photos reveal similar appearance Lactate negative No fever No significant elevation in WBC I don't fee this represents acute infectious etiology Advised to wrap wounds and she can f/u with internal medicine today for pain control needs   Final Clinical Impressions(s) / ED Diagnoses   Final diagnoses:  Ulcer of left lower extremity with fat layer exposed ([place])  Ulcer of right lower extremity with fat layer exposed [facility])  Anxiety    New Prescriptions New Prescriptions   No  medications on file     Ripley Fraise, MD 12/16/16 [phone]

## 2016-12-16 NOTE — Discharge Instructions (Signed)
PLEASE CALL AND FOLLOWUP WITH YOUR PRIMARY DOCTOR TODAY

## 2016-12-21 LAB — CULTURE, BLOOD (ROUTINE X 2)
CULTURE: NO GROWTH
Culture: NO GROWTH

## 2016-12-21 IMAGING — DX DG FINGER INDEX 2+V*R*
3 series · 3 of 3 positions shown · non-contrast
Comparison: RIGHT hand radiographs 10/29/2010

CLINICAL DATA: Swelling at PIP joint RIGHT index finger, had
drainage and blood after sticking site with a needle, question
osteomyelitis

EXAM:
RIGHT INDEX FINGER 2+V

[finger ap]
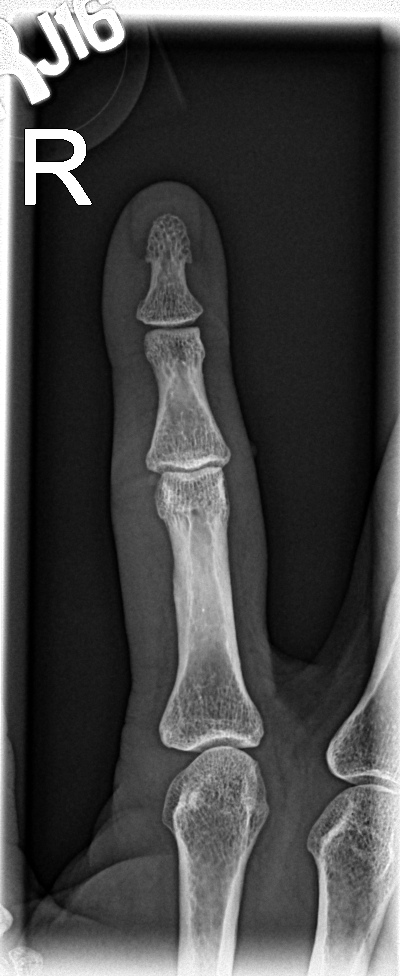

[finger obl]
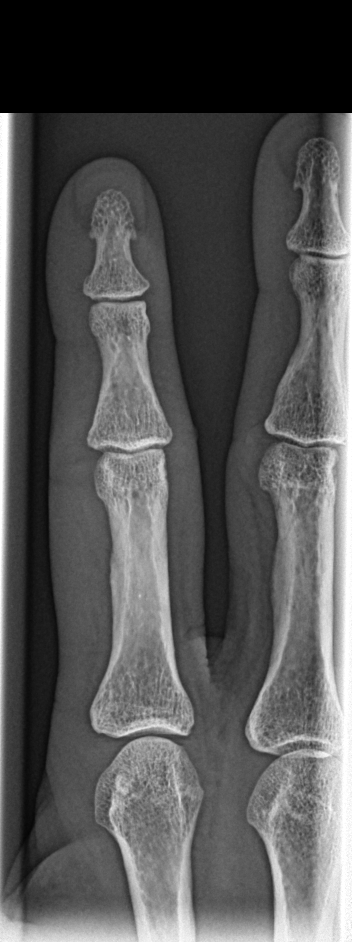

[finger lat]
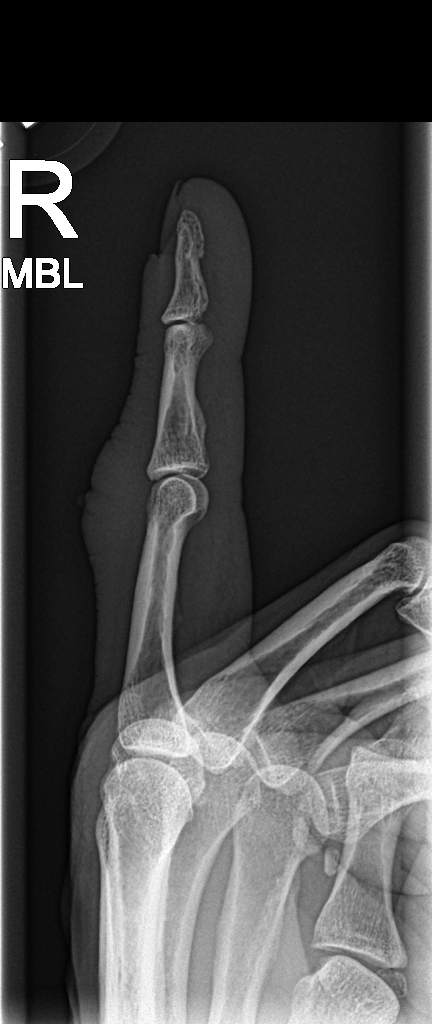

[3 of 3 positions shown; findings below may reference images not displayed]

FINDINGS: Soft tissue swelling centered at PIP joint RIGHT index finger,
especially dorsally.

Osseous mineralization normal.

Joint spaces preserved.

No acute fracture, dislocation or bone destruction.

No soft tissue gas.
IMPRESSION: Soft tissue swelling without acute bony abnormalities.

## 2016-12-22 ENCOUNTER — Inpatient Hospital Stay (HOSPITAL_COMMUNITY): Payer: Medicaid Other

## 2016-12-22 ENCOUNTER — Inpatient Hospital Stay (HOSPITAL_COMMUNITY)
Admission: EM | Admit: 2016-12-22 | Discharge: 2016-12-28 | DRG: 593 | Disposition: A | Discharge status: Home Health | Payer: Medicaid Other | Attending: Infectious Disease | Admitting: Infectious Disease

## 2016-12-22 DIAGNOSIS — L97213 Non-pressure chronic ulcer of right calf with necrosis of muscle: Secondary | ICD-10-CM | POA: Diagnosis present

## 2016-12-22 DIAGNOSIS — D891 Cryoglobulinemia: Secondary | ICD-10-CM | POA: Diagnosis present

## 2016-12-22 DIAGNOSIS — Z79899 Other long term (current) drug therapy: Secondary | ICD-10-CM

## 2016-12-22 DIAGNOSIS — L97813 Non-pressure chronic ulcer of other part of right lower leg with necrosis of muscle: Secondary | ICD-10-CM | POA: Diagnosis not present

## 2016-12-22 DIAGNOSIS — M60861 Other myositis, right lower leg: Secondary | ICD-10-CM | POA: Diagnosis not present

## 2016-12-22 DIAGNOSIS — T148XXA Other injury of unspecified body region, initial encounter: Secondary | ICD-10-CM

## 2016-12-22 DIAGNOSIS — R4189 Other symptoms and signs involving cognitive functions and awareness: Secondary | ICD-10-CM | POA: Diagnosis present

## 2016-12-22 DIAGNOSIS — K0889 Other specified disorders of teeth and supporting structures: Secondary | ICD-10-CM | POA: Diagnosis not present

## 2016-12-22 DIAGNOSIS — Z803 Family history of malignant neoplasm of breast: Secondary | ICD-10-CM

## 2016-12-22 DIAGNOSIS — E870 Hyperosmolality and hypernatremia: Secondary | ICD-10-CM | POA: Diagnosis present

## 2016-12-22 DIAGNOSIS — Z800 Family history of malignant neoplasm of digestive organs: Secondary | ICD-10-CM | POA: Diagnosis not present

## 2016-12-22 DIAGNOSIS — Z886 Allergy status to analgesic agent status: Secondary | ICD-10-CM

## 2016-12-22 DIAGNOSIS — L97823 Non-pressure chronic ulcer of other part of left lower leg with necrosis of muscle: Secondary | ICD-10-CM | POA: Diagnosis not present

## 2016-12-22 DIAGNOSIS — R627 Adult failure to thrive: Secondary | ICD-10-CM | POA: Diagnosis present

## 2016-12-22 DIAGNOSIS — F14988 Cocaine use, unspecified with other cocaine-induced disorder: Secondary | ICD-10-CM | POA: Diagnosis present

## 2016-12-22 DIAGNOSIS — D649 Anemia, unspecified: Secondary | ICD-10-CM | POA: Diagnosis not present

## 2016-12-22 DIAGNOSIS — Z811 Family history of alcohol abuse and dependence: Secondary | ICD-10-CM

## 2016-12-22 DIAGNOSIS — Z9119 Patient's noncompliance with other medical treatment and regimen: Secondary | ICD-10-CM | POA: Diagnosis not present

## 2016-12-22 DIAGNOSIS — M609 Myositis, unspecified: Secondary | ICD-10-CM | POA: Diagnosis present

## 2016-12-22 DIAGNOSIS — Z9109 Other allergy status, other than to drugs and biological substances: Secondary | ICD-10-CM

## 2016-12-22 DIAGNOSIS — R64 Cachexia: Secondary | ICD-10-CM | POA: Diagnosis not present

## 2016-12-22 DIAGNOSIS — F17210 Nicotine dependence, cigarettes, uncomplicated: Secondary | ICD-10-CM | POA: Diagnosis present

## 2016-12-22 DIAGNOSIS — F14288 Cocaine dependence with other cocaine-induced disorder: Secondary | ICD-10-CM | POA: Diagnosis not present

## 2016-12-22 DIAGNOSIS — I999 Unspecified disorder of circulatory system: Secondary | ICD-10-CM

## 2016-12-22 DIAGNOSIS — L97223 Non-pressure chronic ulcer of left calf with necrosis of muscle: Secondary | ICD-10-CM | POA: Diagnosis not present

## 2016-12-22 DIAGNOSIS — L958 Other vasculitis limited to the skin: Secondary | ICD-10-CM | POA: Diagnosis not present

## 2016-12-22 DIAGNOSIS — T374X1S Poisoning by anthelminthics, accidental (unintentional), sequela: Secondary | ICD-10-CM

## 2016-12-22 DIAGNOSIS — M60862 Other myositis, left lower leg: Secondary | ICD-10-CM | POA: Diagnosis not present

## 2016-12-22 DIAGNOSIS — T148XXD Other injury of unspecified body region, subsequent encounter: Secondary | ICD-10-CM

## 2016-12-22 DIAGNOSIS — I10 Essential (primary) hypertension: Secondary | ICD-10-CM | POA: Diagnosis present

## 2016-12-22 DIAGNOSIS — D72829 Elevated white blood cell count, unspecified: Secondary | ICD-10-CM | POA: Diagnosis present

## 2016-12-22 DIAGNOSIS — G3184 Mild cognitive impairment, so stated: Secondary | ICD-10-CM | POA: Diagnosis not present

## 2016-12-22 DIAGNOSIS — D509 Iron deficiency anemia, unspecified: Secondary | ICD-10-CM | POA: Diagnosis present

## 2016-12-22 DIAGNOSIS — I96 Gangrene, not elsewhere classified: Secondary | ICD-10-CM

## 2016-12-22 DIAGNOSIS — L98493 Non-pressure chronic ulcer of skin of other sites with necrosis of muscle: Secondary | ICD-10-CM

## 2016-12-22 DIAGNOSIS — N179 Acute kidney failure, unspecified: Secondary | ICD-10-CM | POA: Diagnosis present

## 2016-12-22 DIAGNOSIS — F1420 Cocaine dependence, uncomplicated: Secondary | ICD-10-CM | POA: Diagnosis present

## 2016-12-22 DIAGNOSIS — M869 Osteomyelitis, unspecified: Secondary | ICD-10-CM

## 2016-12-22 DIAGNOSIS — L089 Local infection of the skin and subcutaneous tissue, unspecified: Secondary | ICD-10-CM | POA: Diagnosis present

## 2016-12-22 LAB — CBC WITH DIFFERENTIAL/PLATELET
Basophils Absolute: 0 10*3/uL (ref 0.0–0.1)
Basophils Relative: 0 %
Eosinophils Absolute: 0 10*3/uL (ref 0.0–0.7)
Eosinophils Relative: 0 %
HEMATOCRIT: 36.9 % (ref 36.0–46.0)
Hemoglobin: 12.7 g/dL (ref 12.0–15.0)
LYMPHS ABS: 1.6 10*3/uL (ref 0.7–4.0)
LYMPHS PCT: 14 %
MCH: 27.5 pg (ref 26.0–34.0)
MCHC: 34.4 g/dL (ref 30.0–36.0)
MCV: 79.9 fL (ref 78.0–100.0)
MONO ABS: 1.2 10*3/uL — AB (ref 0.1–1.0)
MONOS PCT: 11 %
NEUTROS ABS: 8 10*3/uL — AB (ref 1.7–7.7)
Neutrophils Relative %: 75 %
Platelets: 503 10*3/uL — ABNORMAL HIGH (ref 150–400)
RBC: 4.62 MIL/uL (ref 3.87–5.11)
RDW: 15.1 % (ref 11.5–15.5)
WBC: 10.8 10*3/uL — ABNORMAL HIGH (ref 4.0–10.5)

## 2016-12-22 LAB — COMPREHENSIVE METABOLIC PANEL
ALBUMIN: 3.2 g/dL — AB (ref 3.5–5.0)
ALK PHOS: 106 U/L (ref 38–126)
ALT: 11 U/L — ABNORMAL LOW (ref 14–54)
ANION GAP: 12 (ref 5–15)
AST: 18 U/L (ref 15–41)
BUN: 32 mg/dL — ABNORMAL HIGH (ref 6–20)
CALCIUM: 9.9 mg/dL (ref 8.9–10.3)
CO2: 24 mmol/L (ref 22–32)
Chloride: 110 mmol/L (ref 101–111)
Creatinine, Ser: 1.29 mg/dL — ABNORMAL HIGH (ref 0.44–1.00)
GFR calc Af Amer: 54 mL/min — ABNORMAL LOW (ref >60)
GFR calc non Af Amer: 46 mL/min — ABNORMAL LOW (ref >60)
GLUCOSE: 109 mg/dL — AB (ref 65–99)
Potassium: 4.8 mmol/L (ref 3.5–5.1)
SODIUM: 146 mmol/L — AB (ref 135–145)
Total Bilirubin: 0.9 mg/dL (ref 0.3–1.2)
Total Protein: 7.8 g/dL (ref 6.5–8.1)

## 2016-12-22 LAB — I-STAT CG4 LACTIC ACID, ED: Lactic Acid, Venous: 1.73 mmol/L (ref 0.5–1.9)

## 2016-12-22 LAB — URINALYSIS, ROUTINE W REFLEX MICROSCOPIC
Bilirubin Urine: NEGATIVE
Glucose, UA: NEGATIVE mg/dL
Ketones, ur: 20 mg/dL — AB
Leukocytes, UA: NEGATIVE
Nitrite: NEGATIVE
PROTEIN: 30 mg/dL — AB
SPECIFIC GRAVITY, URINE: 1.021 (ref 1.005–1.030)
pH: 5 (ref 5.0–8.0)

## 2016-12-22 MED ORDER — TAB-A-VITE/IRON PO TABS
1.0000 | ORAL_TABLET | Freq: Every day | ORAL | Status: DC
  Administered 2016-12-23 – 2016-12-28 (×6): 1 via ORAL
  Filled 2016-12-22 (×6): qty 1

## 2016-12-22 MED ORDER — LISINOPRIL 20 MG PO TABS
40.0000 mg | ORAL_TABLET | Freq: Once | ORAL | Status: AC
  Administered 2016-12-22: 40 mg via ORAL
  Filled 2016-12-22: qty 2

## 2016-12-22 MED ORDER — HYDROMORPHONE HCL 1 MG/ML IJ SOLN
1.0000 mg | Freq: Once | INTRAMUSCULAR | Status: AC
  Administered 2016-12-22: 1 mg via INTRAVENOUS
  Filled 2016-12-22: qty 1

## 2016-12-22 MED ORDER — VANCOMYCIN HCL IN DEXTROSE 1-5 GM/200ML-% IV SOLN
1000.0000 mg | Freq: Once | INTRAVENOUS | Status: AC
  Administered 2016-12-22: 1000 mg via INTRAVENOUS
  Filled 2016-12-22: qty 200

## 2016-12-22 MED ORDER — SODIUM CHLORIDE 0.9 % IV SOLN
1000.0000 mL | Freq: Once | INTRAVENOUS | Status: AC
  Administered 2016-12-22: 1000 mL via INTRAVENOUS

## 2016-12-22 MED ORDER — COLLAGENASE 250 UNIT/GM EX OINT
TOPICAL_OINTMENT | Freq: Every day | CUTANEOUS | Status: DC
  Filled 2016-12-22: qty 30

## 2016-12-22 MED ORDER — PROMETHAZINE HCL 25 MG PO TABS: 12.5000 mg | ORAL_TABLET | Freq: Four times a day (QID) | ORAL | Status: DC | PRN

## 2016-12-22 MED ORDER — SODIUM CHLORIDE 0.9 % IV SOLN
1000.0000 mL | INTRAVENOUS | Status: DC
  Administered 2016-12-22 – 2016-12-23 (×4): 1000 mL via INTRAVENOUS

## 2016-12-22 MED ORDER — ENOXAPARIN SODIUM 40 MG/0.4ML ~~LOC~~ SOLN
40.0000 mg | SUBCUTANEOUS | Status: DC
  Administered 2016-12-22: 40 mg via SUBCUTANEOUS
  Filled 2016-12-22: qty 0.4

## 2016-12-22 MED ORDER — PIPERACILLIN-TAZOBACTAM 3.375 G IVPB
3.3750 g | Freq: Once | INTRAVENOUS | Status: AC
  Administered 2016-12-22: 3.375 g via INTRAVENOUS
  Filled 2016-12-22: qty 50

## 2016-12-22 MED ORDER — OXYCODONE HCL 5 MG PO TABS
10.0000 mg | ORAL_TABLET | ORAL | Status: DC | PRN
  Administered 2016-12-22 – 2016-12-28 (×29): 10 mg via ORAL
  Filled 2016-12-22 (×30): qty 2

## 2016-12-22 MED ORDER — SENNA 8.6 MG PO TABS
1.0000 | ORAL_TABLET | Freq: Every day | ORAL | Status: DC | PRN
  Administered 2016-12-26 – 2016-12-27 (×2): 8.6 mg via ORAL
  Filled 2016-12-22 (×2): qty 1

## 2016-12-22 MED ORDER — CLONIDINE HCL 0.1 MG PO TABS
0.2000 mg | ORAL_TABLET | Freq: Once | ORAL | Status: AC
  Administered 2016-12-22: 0.2 mg via ORAL
  Filled 2016-12-22: qty 2

## 2016-12-22 MED ORDER — SODIUM CHLORIDE 0.9% FLUSH
3.0000 mL | Freq: Two times a day (BID) | INTRAVENOUS | Status: DC
  Administered 2016-12-22 – 2016-12-28 (×9): 3 mL via INTRAVENOUS

## 2016-12-22 MED ORDER — BOOST / RESOURCE BREEZE PO LIQD
1.0000 | Freq: Three times a day (TID) | ORAL | Status: DC
  Administered 2016-12-23 – 2016-12-28 (×16): 1 via ORAL

## 2016-12-22 NOTE — ED Provider Notes (Signed)
[facility] DEPT Provider Note   CSN: [id number] Arrival date & time: 12/22/16  1444     History   Chief Complaint Chief Complaint  Patient presents with  . Leg ulcers    HPI [name] is a 56 y.o. female.  HPI Patient presents with increasing pain and bleeding from her leg ulcers. She reports they have gotten worse and she has severe pain with walking or any activity. She also reports they're frequently bleeding now. Patient has missed wound care appointments. She reports she also feels weak and dehydrated. Patient denies documented fever. Past Medical History:  Diagnosis Date  . CAP (community acquired pneumonia) 03/2014 X 2  . Cocaine abuse    ongoing with resultant vaculitis.  . Depression   . Headache    "weekly" (07/29/2016)  . Hypertension   . Inflammatory arthritis   . Migraines    "probably 5-6/yr" (07/29/2016)  . Normocytic anemia    BL Hgb 9.8-12. Last anemia panel 04/2010 - showing Fe 19, ferritin 101.  Pt on monthly B12 injections  . Rheumatoid arthritis(714.0)    patient reported  . 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 Followed by Dr. Louanne Skye    Patient Active Problem List   Diagnosis Date Noted  . Wound infection 12/22/2016  . 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 Protein-calorie malnutrition, severe ([place]) 01/15/2015  . MDD (major depressive disorder), recurrent episode, severe ([place]) 10/16/2014  . Cocaine use disorder, severe, dependence ([place]) 10/10/2014  . Substance induced mood disorder ([place]) 10/09/2014  . Acute pericarditis 03/31/2014  . 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 Essential hypertension 02/26/2010    Past Surgical History:  Procedure Laterality Date  . AMPUTATION Left 05/22/2016   Procedure: AMPUTATION LEFT LONG FINGER;  Surgeon: Marybelle Killings, MD;  Location: [facility];  Service: Orthopedics;  Laterality: Left;  . HERNIA REPAIR     "stomach"  . I&D EXTREMITY Right 09/26/2015   Procedure: IRRIGATION AND DEBRIDEMENT LEG WOUND  VAC PLACEMENT.;  Surgeon: Loel Lofty Dillingham, DO;  Location: [facility];  Service: Plastics;  Laterality: Right;  . INCISION AND DRAINAGE OF WOUND Bilateral 10/20/2016   Procedure: IRRIGATION AND DEBRIDEMENT WOUND BILATERAL;  Surgeon: Edrick Kins, DPM;  Location: [facility];  Service: Podiatry;  Laterality: Bilateral;  . IRRIGATION AND DEBRIDEMENT ABSCESS Bilateral 09/26/2013   Procedure: DEBRIDEMENT ULCERS BILATERAL THIGHS;  Surgeon: Gwenyth Ober, MD;  Location: [facility];  Service: General;  Laterality: Bilateral;  . SKIN BIOPSY Bilateral    shin nodules    OB History    No data available       Home Medications    Prior to Admission medications   Medication Sig Start Date End Date Taking? Authorizing Provider  collagenase (SANTYL) ointment Apply topically daily. 10/23/16  Yes Minus Liberty, MD  feeding supplement (BOOST / RESOURCE BREEZE) LIQD Take 1 Container by mouth 3 (three) times daily between meals. 10/23/16  Yes Minus Liberty, MD  ibuprofen (ADVIL,MOTRIN) 600 MG tablet Take 1 tablet (600 mg total) by mouth every 8 (eight) hours as needed for mild pain or moderate pain. 11/05/16  Yes Alexa Angela Burke, MD  lisinopril (PRINIVIL,ZESTRIL) 40 MG tablet Take 1 tablet (40 mg total) by mouth daily. 11/05/16  Yes Alexa Angela Burke, MD  Multiple Vitamins-Iron (MULTIVITAMINS WITH IRON) TABS tablet Take 1 tablet by mouth daily. 10/24/16  Yes Minus Liberty, MD  Oxycodone  HCl 10 MG TABS Take 1 tablet (10 mg total) by mouth every 6 (six) hours as needed. Patient taking differently: Take 10 mg by mouth every 6 (six) hours as needed (For pain.).  12/10/16  Yes Tasrif Ahmed, MD  senna (SENOKOT) 8.6 MG TABS tablet Take 1 tablet (8.6 mg total) by mouth daily as needed for mild constipation. 10/23/16  Yes Minus Liberty, MD    Family History Family History  Problem Relation Age of Onset  . Breast cancer Mother      Breast cancer  . Alcohol abuse Mother   . Colon cancer Maternal Aunt 23  . Alcohol abuse Father     Social History Social History  Substance Use Topics  . Smoking status: Current Every Day Smoker    Packs/day: 0.12    Years: 38.00    Types: Cigarettes  . Smokeless tobacco: Never Used     Comment: 2 A DAY  . Alcohol use No     Allergies   Acetaminophen and Other   Review of Systems Review of Systems 10 Systems reviewed and are negative for acute change except as noted in the HPI.   Physical Exam Updated Vital Signs BP (!) 204/110 (BP Location: Left Arm)   Pulse (!) 110   Temp 98.3 F (36.8 C) (Oral)   Resp 18   Ht 5' 1.5" (1.562 m)   Wt 88 lb 3.2 oz (40 kg)   SpO2 99%   BMI 16.40 kg/m   Physical Exam  Constitutional: She is oriented to person, place, and time.  Patient is cachectic and in distress and tearful. No respiratory distress.  HENT:  Head: Normocephalic and atraumatic.  Mouth/Throat: Oropharynx is clear and moist.  Cardiovascular: Normal rate, regular rhythm, normal heart sounds and intact distal pulses.   Pulmonary/Chest: Effort normal and breath sounds normal.  Abdominal: Soft. She exhibits no distension. There is no tenderness.  Musculoskeletal:  Patient has multiple deep ulcerations to the back of her legs. A deepest ulceration on the posterior legs is to deep muscle layer. There is foul smelling purulent discharge within the wound. She also has significant deep ulcerative wound to her great toe which exposes deep muscle layer of the forefoot.   Neurological: She is alert and oriented to person, place, and time. She exhibits normal muscle tone. Coordination normal.  Skin: Skin is warm and dry.  Psychiatric:  Patient is anxious and tearful.     ED Treatments / Results  Labs (all labs ordered are listed, but only abnormal results are displayed) Labs Reviewed  COMPREHENSIVE METABOLIC PANEL - Abnormal; Notable for the following:       Result  Value   Sodium 146 (*)    Glucose, Bld 109 (*)    BUN 32 (*)    Creatinine, Ser 1.29 (*)    Albumin 3.2 (*)    ALT 11 (*)    GFR calc non Af Amer 46 (*)    GFR calc Af Amer 54 (*)    All other components within normal limits  CBC WITH DIFFERENTIAL/PLATELET - Abnormal; Notable for the following:    WBC 10.8 (*)    Platelets 503 (*)    Neutro Abs 8.0 (*)    Monocytes Absolute 1.2 (*)    All other components within normal limits  URINALYSIS, ROUTINE W REFLEX MICROSCOPIC - Abnormal; Notable for the following:    APPearance HAZY (*)    Hgb urine dipstick SMALL (*)    Ketones, ur 20 (*)  Protein, ur 30 (*)    Bacteria, UA RARE (*)    Squamous Epithelial / LPF 6-30 (*)    All other components within normal limits  CULTURE, BLOOD (ROUTINE X 2)  CULTURE, BLOOD (ROUTINE X 2)  AEROBIC CULTURE (SUPERFICIAL SPECIMEN)  I-STAT CG4 LACTIC ACID, ED  I-STAT CG4 LACTIC ACID, ED    EKG  EKG Interpretation None       Radiology No results found.  Procedures Procedures (including critical care time)  Medications Ordered in ED Medications  piperacillin-tazobactam (ZOSYN) IVPB 3.375 g (3.375 g Intravenous New Bag/Given 12/22/16 1811)  0.9 %  sodium chloride infusion (0 mLs Intravenous Stopped 12/22/16 1810)    Followed by  0.9 %  sodium chloride infusion (1,000 mLs Intravenous New Bag/Given 12/22/16 1700)  vancomycin (VANCOCIN) IVPB 1000 mg/200 mL premix (0 mg Intravenous Stopped 12/22/16 1810)  HYDROmorphone (DILAUDID) injection 1 mg (1 mg Intravenous Given 12/22/16 1658)  HYDROmorphone (DILAUDID) injection 1 mg (1 mg Intravenous Given 12/22/16 1904)  cloNIDine (CATAPRES) tablet 0.2 mg (0.2 mg Oral Given 12/22/16 1902)  lisinopril (PRINIVIL,ZESTRIL) tablet 40 mg (40 mg Oral Given 12/22/16 1909)     Initial Impression / Assessment and Plan / ED Course  I have reviewed the triage vital signs and the nursing notes.  Pertinent labs & imaging results that were available during my care of  the patient were reviewed by me and considered in my medical decision making (see chart for details).      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 She has required chronic pain management for these and has been noncompliant with wound care. Today however the wounds have areas of purulent drainage and are necrotic smelling. At this time, it appears likely she has associated gangrene and necrosis of muscle tissue. Patient will be admitted to internal medicine teaching service for further treatment of exacerbation of chronic severe wounds. New Prescriptions Current Discharge Medication List       Charlesetta Shanks, MD 12/22/16 2016

## 2016-12-22 NOTE — Progress Notes (Addendum)
Received patient from [facility] via [facility] with serousangenious drainage of bilateral leg ulcer. Elevated BP and PR at  204/110 and 110 respectively, pain at 7/10 and O2 Sat at 99% on RA.  Paged Dr. Reesa Chew, IM Resident of patient's admission to floor and will come and see the patient.  Patient now resting on bed watching TV and sipping some Sprite.

## 2016-12-22 NOTE — Progress Notes (Signed)
RN received report from [place], [place]

## 2016-12-22 NOTE — ED Triage Notes (Signed)
Per EMS, pt w/ hx of chronic leg ulcers complains of 10/10 pain. Pt was seen last week and states she has been unable to follow up.

## 2016-12-22 NOTE — ED Notes (Signed)
Bed: WA07 Expected date:  Expected time:  Means of arrival:  Comments: Room is clean

## 2016-12-22 NOTE — H&P (Signed)
Date: 12/22/2016               Patient Name:  [name] MRN: [id number]  DOB: 28-Jun-1961 Age / Sex: 56 y.o., female   PCP: Zada Finders, MD         Medical Service: Internal Medicine Teaching Service         Attending Physician: Dr. Truman Hayward, MD    First Contact: Dr. Danford Bad  Pager: [phone]  Second Contact: Dr. 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  She has had may prior presentations with similar complaints.Her last visit to ED was March 14 with no admission and similar complaints. She is hopelessly addicted to crack cocaine. 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 She has suffered ischemic necrosis of her extremities including the tip of her nose and ear lobes, and the skin of her fingers, and bilateral lower extremities. Due to the vasculitic nature of these wounds, they are nonhealing. She has required  Amputation, but orthopedic has previously refused as her stem will never heal. She is required numerous irrigation and debridement of nonhealing ulcers on her legs bilaterally.  She was referred to wound care clinic, she missed her multiple appointments stating that they don't know her and they hurt her when she goes there by doing debridements.  She continued to use cocaine, last use was about 4 days ago according to patient. She denies any nausea, vomiting, recent change in her appetite, no fever or chills. No dysuria or hematuria.  Meds:  Current Facility-Administered Medications for the 12/22/16 encounter [facility] Encounter)  Medication  . cloNIDine (CATAPRES) tablet 0.2  mg   Current Meds  Medication Sig  . collagenase (SANTYL) ointment Apply topically daily.  . feeding supplement (BOOST / RESOURCE BREEZE) LIQD Take 1 Container by mouth 3 (three) times daily between meals.  Marland Kitchen ibuprofen (ADVIL,MOTRIN) 600 MG tablet Take 1 tablet (600 mg total) by mouth every 8 (eight) hours as needed for mild pain or moderate pain.  Marland Kitchen lisinopril (PRINIVIL,ZESTRIL) 40 MG tablet Take 1 tablet (40 mg total) by mouth daily.  . Multiple Vitamins-Iron (MULTIVITAMINS WITH IRON) TABS tablet Take 1 tablet by mouth daily.  . Oxycodone HCl 10 MG TABS Take 1 tablet (10 mg total) by mouth every 6 (six) hours as needed. (Patient taking differently: Take 10 mg by mouth every 6 (six) hours as needed (For pain.). )  . senna (SENOKOT) 8.6 MG TABS tablet Take 1 tablet (8.6 mg total) by mouth daily as needed for mild constipation.     Allergies: Allergies as of 12/22/2016 - Review Complete 12/22/2016  Allergen Reaction Noted  . Acetaminophen Swelling and Other (See Comments)   . Other Other (See Comments) 10/03/2016   Past Medical History:  Diagnosis Date  . CAP (community acquired pneumonia) 03/2014 X 2  . Cocaine abuse    ongoing with resultant vaculitis.  . Depression   . Headache    "weekly" (07/29/2016)  . Hypertension   . Inflammatory arthritis   . Migraines    "probably 5-6/yr" (  07/29/2016)  . Normocytic anemia    BL Hgb 9.8-12. Last anemia panel 04/2010 - showing Fe 19, ferritin 101.  Pt on monthly B12 injections  . Rheumatoid arthritis(714.0)    patient reported  . 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 Followed by Dr. Louanne Skye    Family History: Mother with breast cancer, mother and father with alcohol abuse  Social History: Active cocaine abuse Last use was 4 days ago according to her, no alcohol or other drugs Smokes ~5 cigarettes daily Living in boarding house.  Review of Systems: A complete ROS was negative  except as per HPI.   Physical Exam: Blood pressure 129/67, pulse 80, temperature 98.3 F (36.8 C), temperature source Oral, resp. rate 18, height 5' 1.5" (1.562 m), weight 88 lb 3.2 oz (40 kg), SpO2 100 %. Vitals:   12/22/16 1730 12/22/16 1800 12/22/16 2007 12/22/16 2150  BP: (!) 197/176 (!) 191/141 (!) 204/110 129/67  Pulse: (!) 117 (!) 110 (!) 110 80  Resp:      Temp:      TempSrc:   Oral   SpO2: 100% 100% 99% 100%  Weight:   88 lb 3.2 oz (40 kg)   Height:   5' 1.5" (1.562 m)    General: Vital signs reviewed.Patient is cachectic and Tearful, in no acute distress and cooperative with exam.  Head: Normocephalic and atraumatic. Eyes: EOMI, conjunctivae normal, no scleral icterus.  Neck: Supple, trachea midline, normal ROM, no JVD, masses, thyromegaly, or carotid bruit present.  Cardiovascular: Tachycardia with regular rhythm, no murmurs, gallops, or rubs. Pulmonary/Chest: Clear to auscultation bilaterally, no wheezes, rales, or rhonchi. Abdominal: Soft, non-tender, non-distended, BS +, no masses, organomegaly, or guarding present.  .Extremities: Multiple deep ulcerations on the back of her both legs, exposing muscles and tendons, looks close to bone, surrounding by necrosed tissue, with foul-smelling serosanguineous and bloody discharge. There was an other deep ulcerative wound at the base of her left great toe.No lower extremity edema bilaterally,  pulses symmetric and intact bilaterally. No cyanosis or clubbing. Neurological: A&O x3, Strength is normal and symmetric bilaterally, cranial nerve II-XII are grossly intact, no focal motor deficit, sensory intact to light touch bilaterally.  Psychiatric: Tearful and anxious.         Labs. CBC    Component Value Date/Time   WBC 10.8 (H) 12/22/2016 1527   RBC 4.62 12/22/2016 1527   HGB 12.7 12/22/2016 1527   HCT 36.9 12/22/2016 1527   PLT 503 (H) 12/22/2016 1527   MCV 79.9 12/22/2016 1527   MCH 27.5 12/22/2016 1527   MCHC 34.4  12/22/2016 1527   RDW 15.1 12/22/2016 1527   LYMPHSABS 1.6 12/22/2016 1527   MONOABS 1.2 (H) 12/22/2016 1527   EOSABS 0.0 12/22/2016 1527   BASOSABS 0.0 12/22/2016 1527   CMP Latest Ref Rng & Units 12/22/2016 12/16/2016 10/29/2016  Glucose 65 - 99 mg/dL 109(H) 180(H) 129(H)  BUN 6 - 20 mg/dL 32(H) 23(H) 16  Creatinine 0.44 - 1.00 mg/dL 1.29(H) 1.04(H) 0.96  Sodium 135 - 145 mmol/L 146(H) 139 141  Potassium 3.5 - 5.1 mmol/L 4.8 3.4(L) 3.7  Chloride 101 - 111 mmol/L 110 105 108  CO2 22 - 32 mmol/L 24 22 22   Calcium 8.9 - 10.3 mg/dL 9.9 9.3 9.6  Total Protein 6.5 - 8.1 g/dL 7.8 6.7 7.0  Total Bilirubin 0.3 - 1.2 mg/dL 0.9 0.5 0.4  Alkaline Phos 38 - 126 U/L 106  78 62  AST 15 - 41 U/L 18 18 14(L)  ALT 14 - 54 U/L 11(L) 11(L) 11(L)   Urinalysis    Component Value Date/Time   COLORURINE YELLOW 12/22/2016 1520   APPEARANCEUR HAZY (A) 12/22/2016 1520   LABSPEC 1.021 12/22/2016 1520   PHURINE 5.0 12/22/2016 1520   GLUCOSEU NEGATIVE 12/22/2016 1520   HGBUR SMALL (A) 12/22/2016 1520   BILIRUBINUR NEGATIVE 12/22/2016 1520   BILIRUBINUR small 04/29/2015 1533   KETONESUR 20 (A) 12/22/2016 1520   PROTEINUR 30 (A) 12/22/2016 1520   UROBILINOGEN 1.0 06/14/2015 2234   NITRITE NEGATIVE 12/22/2016 1520   LEUKOCYTESUR NEGATIVE 12/22/2016 1520   Lactic Acid. 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  Chronic wounds due to cocaine abuse and Levimasole vasculitis.She has had may prior presentations with similar complaints. She presents at this time with increasing pain from chronic, necrotic, nonhealing, bilateral, leg ulcers. She does not has any frank purulent discharge currently. She has mild leukocytosis, remained afebrile. Her all cell lines has been increased,May be it is falsely positive leukocytosis due to volume  contraction. She was given 1 L NS bolus in ED. She already got one dose of Zosyn and vancomycin in ED. -Admit to MedSurg. -Wound care consult. -ESR and C-reactive protein to rule out any infection. -Repeat CBC in the morning. -X-rays of both lower extremities-for any possible osteomyelitis, and if negative can be followed up with MRI if suspicion remains high. -We will hold antibiotics, can be restarted if needed. -IV NS at 125 mL per hour. -Oxycodone 10 mg every 4 hourly when necessary for pain.  Hypertension. She has an history of being noncompliant, although she was saying that she is now takes her blood pressure medicine regularly. Never took on the day of admission as she spent more of her day in hospital.  Found to be hypertensive, on presentation with systolic blood pressure and 190-200. She was given a dose of lisinopril and clonidine in ED. Her blood pressure normalized after that. -Continue home dose of lisinopril 40 mg daily.  AKI. Her creatinine on presentation was 1.29, BUN was 32, with baseline less than 1. Most likely due to poor by mouth intake. She was given 1 L of normal saline in ED. Having some ketones in urine. -IV normal saline at 125 mL per hour. -Repeat BMP tomorrow morning.  Mild hypernatremia. Most likely due to dehydration. She will and was given a bolus of normal saline in the ED, and getting IV fluids. -Repeat BMP tomorrow morning.  DVT Prophylaxis: lovenox Code Status: full Diet: heart healthy  Dispo: Admit patient to Inpatient with expected length of stay greater than 2 midnights.  Signed: Lorella Nimrod, MD 12/22/2016, 11:50 PM  Pager: [phone]

## 2016-12-23 ENCOUNTER — Encounter (HOSPITAL_COMMUNITY): Payer: Self-pay | Admitting: General Practice

## 2016-12-23 LAB — CBC
HCT: 28.7 % — ABNORMAL LOW (ref 36.0–46.0)
HEMOGLOBIN: 9.2 g/dL — AB (ref 12.0–15.0)
MCH: 26.4 pg (ref 26.0–34.0)
MCHC: 32.1 g/dL (ref 30.0–36.0)
MCV: 82.5 fL (ref 78.0–100.0)
PLATELETS: 360 10*3/uL (ref 150–400)
RBC: 3.48 MIL/uL — AB (ref 3.87–5.11)
RDW: 15.1 % (ref 11.5–15.5)
WBC: 8.1 10*3/uL (ref 4.0–10.5)

## 2016-12-23 LAB — BASIC METABOLIC PANEL
ANION GAP: 12 (ref 5–15)
BUN: 21 mg/dL — ABNORMAL HIGH (ref 6–20)
CALCIUM: 8.4 mg/dL — AB (ref 8.9–10.3)
CO2: 22 mmol/L (ref 22–32)
CREATININE: 1.13 mg/dL — AB (ref 0.44–1.00)
Chloride: 105 mmol/L (ref 101–111)
GFR, EST NON AFRICAN AMERICAN: 54 mL/min — AB (ref >60)
Glucose, Bld: 102 mg/dL — ABNORMAL HIGH (ref 65–99)
Potassium: 3.9 mmol/L (ref 3.5–5.1)
SODIUM: 139 mmol/L (ref 135–145)

## 2016-12-23 LAB — SEDIMENTATION RATE: SED RATE: 67 mm/h — AB (ref 0–22)

## 2016-12-23 LAB — GLUCOSE, CAPILLARY: Glucose-Capillary: 105 mg/dL — ABNORMAL HIGH (ref 65–99)

## 2016-12-23 LAB — C-REACTIVE PROTEIN: CRP: 5.1 mg/dL — AB (ref <1.0)

## 2016-12-23 MED ORDER — ENOXAPARIN SODIUM 30 MG/0.3ML ~~LOC~~ SOLN
30.0000 mg | SUBCUTANEOUS | Status: DC
  Administered 2016-12-23 – 2016-12-27 (×5): 30 mg via SUBCUTANEOUS
  Filled 2016-12-23 (×5): qty 0.3

## 2016-12-23 MED ORDER — LISINOPRIL 20 MG PO TABS
20.0000 mg | ORAL_TABLET | Freq: Every day | ORAL | Status: DC
  Administered 2016-12-23: 20 mg via ORAL
  Filled 2016-12-23: qty 1

## 2016-12-23 MED ORDER — LISINOPRIL 40 MG PO TABS
40.0000 mg | ORAL_TABLET | Freq: Every day | ORAL | Status: DC
  Administered 2016-12-24 – 2016-12-28 (×5): 40 mg via ORAL
  Filled 2016-12-23 (×5): qty 1

## 2016-12-23 MED ORDER — MORPHINE SULFATE (PF) 4 MG/ML IV SOLN
4.0000 mg | Freq: Once | INTRAVENOUS | Status: AC
  Administered 2016-12-23: 4 mg via INTRAVENOUS
  Filled 2016-12-23: qty 1

## 2016-12-23 NOTE — Progress Notes (Signed)
1230 Stage 2 pressure ulcer noted to sacrum, 2 open sores noted to the left 1cmx 1cm and a 0.8cm x 0.8cm. Dressing applied. Encouraged pt to turn to sides at intervals.

## 2016-12-23 NOTE — Care Management Note (Signed)
Case Management Note  Patient Details  Name: [name] MRN: [id number] Date of Birth: [birth year]  Subjective/Objective:                    Action/Plan:  Will follow , in past refused home health  Expected Discharge Date:                  Expected Discharge Plan:  Home/Self Care  In-House Referral:     Discharge planning Services     Post Acute Care Choice:    Choice offered to:     DME Arranged:    DME Agency:     HH Arranged:    [place] Agency:     Status of Service:  In process, will continue to follow  If discussed at Long Length of Stay Meetings, dates discussed:    Additional Comments:  Marilu Favre, RN 12/23/2016, 10:08 AM

## 2016-12-23 NOTE — Consult Note (Addendum)
[facility] Nurse wound consult note Pt is familiar to [facility] team from multiple previous admissions.  She has chronic nonhealing wounds to BLE and states she realizes they will not get better while she continues to use cocaine.  Ortho service has been consulted in the past for possible amputation surgery, but they declined since surgical wounds wound not heal if patient continues to use cocaine, according to their EMR notes. All wounds have increased in size and depth since previous assessment and declined in appearance, increased in odor, and amt of drainage. Informed consent form signiture obtained to use pt's photos which are in the EMR for possible publication regarding topical treatment of levamisole vasculitis wounds, pt verbalized understanding and stated, "I hope I can help someone else who might be going through this." Reason for Consult: multiple ulcers related tolevamisole vasculitis  Wound type: multiple full thickness ulcerations with necrosis Measurement: Right posterior calf 23cm x 12cm x 8.0cm; 40% necrotic/60% red, tendons visible, strong foul odor, large amt thick tan drainage Right lower ankle area 2X2X.3cm, 10% eschar, 90% red, mod amt thick tan drainage, strong odor. Left great toe/plantar surface: 3cm x2cm x 0.2cm; 30% black/70% red, mod amt tan drainage, strong odor, loose flap of skin surrounding wound Left inner malleolus: 6X5X.3cm 50% loose eschar/40%red, 10% yellow, mod amt thick tan drainage, strong odor  Left posterior ankleolus: 10X10X.3cm, 80% red, 20% eschar, mod amt tan drainage, strong odor Periwound: intact with evidence of scarring over the legs from previous wounds Dressing procedure/placement/frequency: Performed conservative sharp debridement of loose nonviable tissue using scissors and forceps to areas which were barely attached to several wounds and had a strong foul odor, pt tolerated with minimal discomfort and no bleeding. Pt will not allow any dressings except  nonadherent, ABD pads, and kelrex.  Pt states, "anything else doesn't work or is too painful." She prefers to perform her own dressings. Please consider consult with palliative care team to determine goals of care. Consider re-consult to ortho service to re-eval for amputation of the RLE if aggressive plan of care is desired; since topical treatment has become minimally effective during the past several months and wounds continue to decline. Discussed POC with patient and bedside nurse.  Please re-consult if further assistance is needed.  Thank-you,  Julien Girt MSN, [place], [place], [place], [place]

## 2016-12-23 NOTE — Progress Notes (Signed)
PT Cancellation Note  Patient Details Name: [name] MRN: [id number] DOB: 10-Mar-1961   Cancelled Treatment:    Reason Eval/Treat Not Completed: Pain limiting ability to participate Pt sleeping upon entry, however, when reporting she is in too much pain to participate. RN notified for pain meds. Will follow up as schedule allows.    Army Melia 12/23/2016, 4:34 PM   Nicky Pugh, PT, DPT  Acute Rehabilitation Services  Pager: [phone]

## 2016-12-23 NOTE — Progress Notes (Signed)
   Subjective: Ms. Sleeper was seen and evaluated at bedside. Initially sleeping in bed however upon awakening quite distressed due to pain from her chronic lower extremity wounds. Open for facility placement.   Objective:  Vital signs in last 24 hours: Vitals:   12/22/16 1800 12/22/16 2007 12/22/16 2150 12/23/16 0423  BP: (!) 191/141 (!) 204/110 129/67 132/68  Pulse: (!) 110 (!) 110 80 84  Resp:    17  Temp:    98.3 F (36.8 C)  TempSrc:  Oral  Oral  SpO2: 100% 99% 100% 99%  Weight:  88 lb 3.2 oz (40 kg)  88 lb 2.9 oz (40 kg)  Height:  5' 1.5" (1.562 m)     General: Cachectic and chronically ill appearing african american female crying in bed. In emotional distress. Holding lower extremities and crying. HENT: EOMI. No conjunctival injection, icterus or ptosis. Poor dentition. Pulmonary: Unlabored breathing. No audible wheezes.  Abdomen: Non-distended. No guarding or rigidity Extremities: No peripheral edema noted BL. Large BL LE wounds covered in dressings. Foul smelling serosanguineous drainage Psych: Mood depressed and anxious and affect was mood congruent. Responds to questions appropriately. Tearful throughout entire examination.   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 Pt significantly distressed due to pain and her condition. Interested in SNF placement to help keep her away from the bad influences at home. Concern for osteomyelitis in this patient with visible bone noted on admission. She's been on chronic doxycycline in the past for concerns of osteo. She has been seen and evaluated by general surgery and plastics multiple times who will not operate as patient continues to use cocaine. She was again lectured on the importance of cocaine cessation as this is  the treatment for her condition.  -Received Vanc and Zosyn in ED. Holding abx at this time  -Wound care recs palliative care consult. She has been seen by palliative care multiple times who have been unable to get through to the patient. Could consider repeat consult -PT on board for eval, pt would benefit from SNF placement -Wound care on - appreciate their assistance -Will order MRI as visible bone noted on admission  Hypertension: Hypertensive on admission with SBP in 190's-200's however resolved with resuming her home medications. She has history of non-compliance. -Lisinopril 40 mg daily   AKI: Cr on admission 1.3, normal baseline. Cr today 1.1. Have been rehydrating patient and will recheck BMET in AM.  -BMET  Dispo: Anticipated discharge unclear.   Moe Graca, DO 12/23/2016, 11:43 AM Pager: [phone]

## 2016-12-23 NOTE — Progress Notes (Signed)
OT Cancellation    12/23/16 1500  OT Visit Information  Last OT Received On 12/23/16  Reason Eval/Treat Not Completed Patient declined, no reason specified (Will plan for eval tomorrow. )   Loretha Brasil, OTR/L [phone]

## 2016-12-24 ENCOUNTER — Inpatient Hospital Stay (HOSPITAL_COMMUNITY): Payer: Medicaid Other

## 2016-12-24 DIAGNOSIS — R64 Cachexia: Secondary | ICD-10-CM

## 2016-12-24 DIAGNOSIS — I96 Gangrene, not elsewhere classified: Secondary | ICD-10-CM

## 2016-12-24 DIAGNOSIS — L98493 Non-pressure chronic ulcer of skin of other sites with necrosis of muscle: Secondary | ICD-10-CM

## 2016-12-24 DIAGNOSIS — K0889 Other specified disorders of teeth and supporting structures: Secondary | ICD-10-CM

## 2016-12-24 DIAGNOSIS — M869 Osteomyelitis, unspecified: Secondary | ICD-10-CM | POA: Insufficient documentation

## 2016-12-24 LAB — BASIC METABOLIC PANEL
Anion gap: 8 (ref 5–15)
BUN: 11 mg/dL (ref 6–20)
CHLORIDE: 112 mmol/L — AB (ref 101–111)
CO2: 20 mmol/L — AB (ref 22–32)
CREATININE: 0.95 mg/dL (ref 0.44–1.00)
Calcium: 8 mg/dL — ABNORMAL LOW (ref 8.9–10.3)
GFR calc Af Amer: >60 mL/min (ref >60)
GFR calc non Af Amer: >60 mL/min (ref >60)
GLUCOSE: 116 mg/dL — AB (ref 65–99)
Potassium: 3.6 mmol/L (ref 3.5–5.1)
SODIUM: 140 mmol/L (ref 135–145)

## 2016-12-24 LAB — GLUCOSE, CAPILLARY: GLUCOSE-CAPILLARY: 101 mg/dL — AB (ref 65–99)

## 2016-12-24 MED ORDER — GADOBENATE DIMEGLUMINE 529 MG/ML IV SOLN
10.0000 mL | Freq: Once | INTRAVENOUS | Status: AC | PRN
  Administered 2016-12-24: 8 mL via INTRAVENOUS

## 2016-12-24 NOTE — Evaluation (Addendum)
Occupational Therapy Evaluation Patient Details Name: [name] MRN: [id number] DOB: June 26, 1961 Today's Date: 12/24/2016    History of Present Illness Pt is 56 y/o female admitted secondary to cocaine induced vascular disorders and wounds on B feet. PMH includes vasculitis, RA, migraines, cocaine abuse, depression, and HTN.    Clinical Impression   PTA, pt was living with 4 roommates and was independent in ADLs and IADLs. Currently, pt's participation in [place] is limited by pain in [place]. Pt performed donning new gown, bathing seated in bed, and lateral scoot to BSC with Min guard A. Pt would benefit from continued acute OT to increase independence in ADLs and functional mobility. Recommend dc SNF for further OT to facilitate safe transition home.     Follow Up Recommendations  Supervision/Assistance - 24 hour;SNF (Initial 24/7 supervision)    Equipment Recommendations  Other (comment) (Defer to next session)    Recommendations for Other Services       Precautions / Restrictions Precautions Precautions: None Restrictions Weight Bearing Restrictions: No      Mobility Bed Mobility Overal bed mobility: Needs Assistance Bed Mobility: Supine to Sit     Supine to sit: Min guard     General bed mobility comments: Min guard for steadying during trunk elevation. Pt very tearful with movement secondary to pain in BLE. Attempted to assist with BLE to decrease pain, however, pt requesting not to touch BLE.   Transfers Overall transfer level: Needs assistance Equipment used: None Transfers: Lateral/Scoot Transfers          Lateral/Scoot Transfers: Min guard General transfer comment: Posterior scoot onto bedside commode. Min guard for safety and steadying during transfer. Pt very tearful secondary to pain and requesting PT not touch BLE.     Balance Overall balance assessment: Needs assistance Sitting-balance support: Feet unsupported;Bilateral upper extremity  supported Sitting balance-Leahy Scale: Poor Sitting balance - Comments: Required use of BUE during sitting balance secondary to increased pain in BLE with movement.  Postural control: Posterior lean                                  ADL Overall ADL's : Needs assistance/impaired Eating/Feeding: Set up;Sitting   Grooming: Set up;Sitting   Upper Body Bathing: Minimal assistance;Sitting   Lower Body Bathing: Minimal assistance;Sitting/lateral leans   Upper Body Dressing : Minimal assistance Upper Body Dressing Details (indicate cue type and reason): donnned gown Lower Body Dressing: Minimal assistance;Sitting/lateral leans   Toilet Transfer: Min guard;BSC (Lateral scoot to [facility])             General ADL Comments: Pt performed Bathing and donning new gown with Min A. Pt limited by pain and anxious behavior     Vision         Perception     Praxis      Pertinent Vitals/Pain Pain Assessment: Faces Faces Pain Scale: Hurts whole lot Pain Location: B feet and legs  Pain Descriptors / Indicators: Crying;Sharp;Tender Pain Intervention(s): Limited activity within patient's tolerance;Monitored during session;Repositioned;Patient requesting pain meds-RN notified     Hand Dominance Right   Extremity/Trunk Assessment Upper Extremity Assessment Upper Extremity Assessment: Overall WFL for tasks assessed   Lower Extremity Assessment Lower Extremity Assessment: Defer to PT evaluation RLE Deficits / Details: Reporting tingling sensation through legs. Very guarded with movement secondary to increased pain. Pt able to flex hip and knees in supine, so at least 3/5.  LLE  Deficits / Details: Reporting tingling sensation through legs. Very guarded with movement secondary to increased pain. Pt able to flex hip and knees in supine, so at least 3/5.       Communication Communication Communication: No difficulties   Cognition Arousal/Alertness: Awake/alert Behavior During  Therapy: Anxious (tearful ) Overall Cognitive Status: No family/caregiver present to determine baseline cognitive functioning (Likely baseline)                     General Comments  Pt very tearful throughout session but stats that she was to partciiapte in therapy and get stronger    Exercises       Shoulder Instructions      Home Living Family/patient expects to be discharged to:: Group home Living Arrangements: Other (Comment) (lives in a boarding house) Available Help at Discharge: Friend(s);Available 24 hours/day Type of Home: House (boarding house) Home Access: Stairs to enter [employer] of Steps: 8 Entrance Stairs-Rails: Left       Bathroom Shower/Tub: Tub/shower unit;Curtain   Bathroom Toilet: Standard     Home Equipment: Cane - single point;Walker - 2 wheels;Wheelchair - manual          Prior Functioning/Environment Level of Independence: Needs assistance  Gait / Transfers Assistance Needed: Used RW or WC for mobility. Reports her friends help her if she needs to get down the steps.  ADL's / Homemaking Assistance Needed: Reports she could bathe and dress independently.             OT Problem List: Decreased activity tolerance;Impaired balance (sitting and/or standing);Pain;Decreased safety awareness;Decreased knowledge of use of DME or AE      OT Treatment/Interventions: Self-care/ADL training;Therapeutic exercise;Energy conservation;DME and/or AE instruction;Therapeutic activities;Patient/family education    OT Goals(Current goals can be found in the care plan section) Acute Rehab OT Goals Patient Stated Goal: Unable to state goal  ADL Goals Pt Will Perform Grooming: with modified independence;standing Pt Will Perform Upper Body Bathing: with modified independence;standing Pt Will Perform Lower Body Bathing: with modified independence;sit to/from stand Pt Will Transfer to Toilet: with modified independence;ambulating;regular height  toilet  OT Frequency: Min 2X/week   Barriers to D/C:            Co-evaluation              End of Session    Activity Tolerance:   Patient left:    OT Visit Diagnosis: Unsteadiness on feet (R26.81);Pain Pain - Right/Left: Right Pain - part of body: Knee                ADL either performed or assessed with clinical judgement  Time: 7169-6789 OT Time Calculation (min): 25 min Charges:  OT General Charges $OT Visit: 1 Procedure OT Evaluation $OT Eval Low Complexity: 1 Procedure G-Codes:     Shea Kapur, OTR/L [phone]  [place] 12/24/2016, 4:55 PM

## 2016-12-24 NOTE — Progress Notes (Signed)
   Subjective: [name] was seen and evaluated at bedside. Still continues to complain of lower extremity pain but improved slightly since admission. Open to SNF placement today.   Objective:  Vital signs in last 24 hours: Vitals:   12/22/16 2150 12/23/16 0423 12/23/16 1449 12/23/16 2137  BP: 129/67 132/68 118/64 (!) 144/77  Pulse: 80 84 94 100  Resp:  17 17 17   Temp:  98.3 F (36.8 C) 99.1 F (37.3 C) 99.9 F (37.7 C)  TempSrc:  Oral Oral Oral  SpO2: 100% 99% 98% 98%  Weight:  88 lb 2.9 oz (40 kg)    Height:       General: Cachectic and chronically ill appearing african [place] female crying in bed. Tearful. HENT: No conjunctival injection, icterus or ptosis. Poor dentition. Pulmonary: Unlabored breathing. No audible wheezes.  Abdomen: Non-distended. No guarding or rigidity Extremities: Large BL LE wounds covered in dressings. Psych: Mood depressed and anxious and affect was mood congruent. Responds to questions appropriately. Tearful throughout examination.   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 The patient is quite debilitated due to her multiple large lower extremity wounds and associated pain. MRI obtained today negative for osteomyelitis however of course did demonstrate diffuse soft tissue involvement. She was again lectured on the importance of cocaine cessation as this is the treatment for her condition. Have discussed case with plastics who will see patient however unlikely to surgically intervene at this time.  -Plastics on board, greatly appreciate their input in regards to management of this patient.  -Oxycodone for pain -Received Vanc and Zosyn in ED. Holding abx at this time  -Wound care recs palliative care consult. She has been seen by  palliative care multiple times who have been unable to get through to the patient. Could consider repeat consult -PT on board for eval, pt would benefit from SNF placement -Wound care on - appreciate their assistance  Hypertension: Hypertensive on admission with SBP in 190's-200's however resolved with resuming her home medications. She has history of non-compliance. -Lisinopril 40 mg daily   AKI: Cr on admission 1.3, normal baseline and currently 0.95.  Dispo: Anticipated discharge unclear.   Cristina Guinyard, DO 12/24/2016, 10:24 AM Pager: [phone]

## 2016-12-24 NOTE — Evaluation (Signed)
Physical Therapy Evaluation Patient Details Name: [name] MRN: [id number] DOB: 21-May-1961 Today's Date: 12/24/2016   History of Present Illness  Pt is 56 y/o female admitted secondary to cocaine induced vascular disorders and wounds on B feet. PMH includes vasculitis, RA, migraines, cocaine abuse, depression, and HTN.   Clinical Impression  Pt admitted for problem above with deficits below. PTA, pt reports she was independent with use of RW and WC at boarding home. Pt reports her friends would help her walk up the steps. Upon evaluation, pt extremely tearful, and experiencing pain in BLE which limited participation in functional mobility tasks. Pt only able to perform bed mobility and posterior scoot transfers. Per pt current status, recommending SNF to increase independence with functional mobility tasks. Will continue to follow and update d/c recommendations as pt progresses. Will continue to follow.     Follow Up Recommendations SNF;Supervision/Assistance - 24 hour    Equipment Recommendations  3in1 (PT)    Recommendations for Other Services       Precautions / Restrictions Precautions Precautions: None Restrictions Weight Bearing Restrictions: No      Mobility  Bed Mobility Overal bed mobility: Needs Assistance Bed Mobility: Supine to Sit     Supine to sit: Min guard     General bed mobility comments: Min guard for steadying during trunk elevation. Pt very tearful with movement secondary to pain in BLE. Attempted to assist with BLE to decrease pain, however, pt requesting not to touch BLE.   Transfers Overall transfer level: Needs assistance Equipment used: None Transfers: Lateral/Scoot Transfers          Lateral/Scoot Transfers: Min guard General transfer comment: Posterior scoot onto bedside commode. Min guard for safety and steadying during transfer. Pt very tearful secondary to pain and requesting PT not touch BLE.   Ambulation/Gait                 Stairs            Wheelchair Mobility    Modified Rankin (Stroke Patients Only)       Balance Overall balance assessment: Needs assistance Sitting-balance support: Feet unsupported;Bilateral upper extremity supported Sitting balance-Leahy Scale: Poor Sitting balance - Comments: Required use of BUE during sitting balance secondary to increased pain in BLE with movement.  Postural control: Posterior lean                                   Pertinent Vitals/Pain Pain Assessment: Faces Faces Pain Scale: Hurts whole lot Pain Location: B feet and legs  Pain Descriptors / Indicators: Crying;Sharp;Tender Pain Intervention(s): Limited activity within patient's tolerance;Monitored during session;Repositioned;Patient requesting pain meds-RN notified    Home Living Family/patient expects to be discharged to:: Group home Living Arrangements: Other (Comment) (lives in a boarding house) Available Help at Discharge: Friend(s);Available 24 hours/day Type of Home: House (boarding house) Home Access: Stairs to enter Entrance Stairs-Rails: Left Entrance Stairs-Number of Steps: 8   Home Equipment: Cane - single point;Walker - 2 wheels;Wheelchair - manual      Prior Function Level of Independence: Needs assistance   Gait / Transfers Assistance Needed: Used RW or WC for mobility. Reports her friends help her if she needs to get down the steps.   ADL's / Homemaking Assistance Needed: Reports she could bathe and dress independently.         Hand Dominance   Dominant Hand: Right  Extremity/Trunk Assessment        Lower Extremity Assessment Lower Extremity Assessment: RLE deficits/detail;LLE deficits/detail RLE Deficits / Details: Reporting tingling sensation through legs. Very guarded with movement secondary to increased pain. Pt able to flex hip and knees in supine, so at least 3/5.  LLE Deficits / Details: Reporting tingling sensation through legs. Very  guarded with movement secondary to increased pain. Pt able to flex hip and knees in supine, so at least 3/5.       Communication   Communication: No difficulties  Cognition Arousal/Alertness: Awake/alert Behavior During Therapy:  (tearful ) Overall Cognitive Status: No family/caregiver present to determine baseline cognitive functioning (Likely baseline)                      General Comments General comments (skin integrity, edema, etc.): Pt very tearful throughout session, however, at end of session very thankful of PT/OT time spent with pt.     Exercises     Assessment/Plan    PT Assessment Patient needs continued PT services  PT Problem List Decreased strength;Decreased range of motion;Decreased activity tolerance;Decreased balance;Decreased mobility;Decreased coordination;Decreased knowledge of use of DME;Decreased safety awareness;Decreased knowledge of precautions;Impaired sensation;Pain       PT Treatment Interventions DME instruction;Gait training;Stair training;Functional mobility training;Therapeutic activities;Therapeutic exercise;Balance training;Neuromuscular re-education;Patient/family education    PT Goals (Current goals can be found in the Care Plan section)  Acute Rehab PT Goals Patient Stated Goal: Unable to state goal  PT Goal Formulation: With patient Time For Goal Achievement: 01/07/17 Potential to Achieve Goals: Fair    Frequency Min 3X/week   Barriers to discharge Decreased caregiver support      Co-evaluation               End of Session   Activity Tolerance: Patient limited by pain Patient left: in bed;with call bell/phone within reach;with nursing/sitter in room Nurse Communication: Mobility status PT Visit Diagnosis: Other abnormalities of gait and mobility (R26.89);Pain Pain - Right/Left:  (Bilateral) Pain - part of body: Leg;Ankle and joints of foot         Time: 2182-8833 PT Time Calculation (min) (ACUTE ONLY): 24  min   Charges:   PT Evaluation $PT Eval Low Complexity: 1 Procedure     PT G Codes:         Army Melia 12/24/2016, 3:19 PM  Nicky Pugh, PT, DPT  Acute Rehabilitation Services  Pager: [phone]

## 2016-12-24 NOTE — Consult Note (Signed)
Reason for Consult:bilateral leg wounds Referring Physician: Dr. Lorella Nimrod  [name] is an 56 y.o. female.  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  She has ongoing drug use.  The wounds are not improving.  She tries to do nonstick dressing.  It is not clear if the dressings are being done on a regular basis.  There is muscle and bone exposed.  There is necrotic tissue bilateral legs of the posterior distal leg, plantar toes, medial and latera.  Malodor noted.  The feet are swollen.  No cellulitis noted.    Past Medical History:  Diagnosis Date  . CAP (community acquired pneumonia) 03/2014 X 2  . Cocaine abuse    ongoing with resultant vaculitis.  . Depression   . Headache    "weekly" (07/29/2016)  . Hypertension   . Inflammatory arthritis   . Migraines    "probably 5-6/yr" (07/29/2016)  . Normocytic anemia    BL Hgb 9.8-12. Last anemia panel 04/2010 - showing Fe 19, ferritin 101.  Pt on monthly B12 injections  . Rheumatoid arthritis(714.0)    patient reported  . 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 Followed by Dr. Louanne Skye    Past Surgical History:  Procedure Laterality Date  . AMPUTATION Left 05/22/2016   Procedure: AMPUTATION LEFT LONG FINGER;  Surgeon: Marybelle Killings, MD;  Location: [facility];  Service: Orthopedics;  Laterality: Left;  . HERNIA REPAIR     "stomach"  . I&D EXTREMITY Right 09/26/2015   Procedure: IRRIGATION AND DEBRIDEMENT LEG WOUND  VAC PLACEMENT.;  Surgeon: Loel Lofty Dillingham, DO;  Location: [facility];  Service: Plastics;  Laterality: Right;  . INCISION AND DRAINAGE OF WOUND Bilateral 10/20/2016   Procedure: IRRIGATION AND DEBRIDEMENT WOUND BILATERAL;  Surgeon: Edrick Kins, DPM;  Location: [facility];  Service: Podiatry;  Laterality: Bilateral;  . IRRIGATION AND DEBRIDEMENT ABSCESS Bilateral 09/26/2013   Procedure: DEBRIDEMENT  ULCERS BILATERAL THIGHS;  Surgeon: Gwenyth Ober, MD;  Location: [facility];  Service: General;  Laterality: Bilateral;  . SKIN BIOPSY Bilateral    shin nodules    Family History  Problem Relation Age of Onset  . Breast cancer Mother     Breast cancer  . Alcohol abuse Mother   . Colon cancer Maternal Aunt 23  . Alcohol abuse Father     Social History:  reports that she has been smoking Cigarettes.  She has a 4.56 pack-year smoking history. She has never used smokeless tobacco. She reports that she uses drugs, including "Crack" cocaine and Cocaine. She reports that she does not drink alcohol.  Allergies:  Allergies  Allergen Reactions  . Acetaminophen Swelling and Other (See Comments)    Reaction:  Eyelid swelling  . Other Other (See Comments)    Pt states she is allergic to a blood pressure medication, but does not know the reaction or medication    Medications: I have reviewed the patient's current medications.  Results for orders placed or performed during the hospital encounter of 12/22/16 (from the past 48 hour(s))  Blood culture (routine x 2)     Status: None (Preliminary result)   Collection Time: 12/22/16  3:15 PM  Result Value Ref Range   Specimen Description BLOOD RIGHT FOREARM    Special Requests IN PEDIATRIC BOTTLE [place]    Culture  NO GROWTH 2 DAYS Performed at [facility], [place] [address], [place], [place] [address]    Report Status PENDING   Urinalysis, Routine w reflex microscopic     Status: Abnormal   Collection Time: 12/22/16  3:20 PM  Result Value Ref Range   Color, Urine YELLOW YELLOW   APPearance HAZY (A) CLEAR   Specific Gravity, Urine 1.021 1.005 - 1.030   pH 5.0 5.0 - 8.0   Glucose, UA NEGATIVE NEGATIVE mg/dL   Hgb urine dipstick SMALL (A) NEGATIVE   Bilirubin Urine NEGATIVE NEGATIVE   Ketones, ur 20 (A) NEGATIVE mg/dL   Protein, ur 30 (A) NEGATIVE mg/dL   Nitrite NEGATIVE NEGATIVE   Leukocytes, UA NEGATIVE NEGATIVE   RBC / HPF 0-5 0 - 5  RBC/hpf   WBC, UA 0-5 0 - 5 WBC/hpf   Bacteria, UA RARE (A) NONE SEEN   Squamous Epithelial / LPF 6-30 (A) NONE SEEN   Mucous PRESENT   Blood culture (routine x 2)     Status: None (Preliminary result)   Collection Time: 12/22/16  3:23 PM  Result Value Ref Range   Specimen Description BLOOD LEFT WRIST    Special Requests IN PEDIATRIC BOTTLE 1CC    Culture      NO GROWTH 2 DAYS Performed at [facility], [address] [place], [place] [address]    Report Status PENDING   Comprehensive metabolic panel     Status: Abnormal   Collection Time: 12/22/16  3:27 PM  Result Value Ref Range   Sodium 146 (H) 135 - 145 mmol/L   Potassium 4.8 3.5 - 5.1 mmol/L   Chloride 110 101 - 111 mmol/L   CO2 24 22 - 32 mmol/L   Glucose, Bld 109 (H) 65 - 99 mg/dL   BUN 32 (H) 6 - 20 mg/dL   Creatinine, Ser 1.29 (H) 0.44 - 1.00 mg/dL   Calcium 9.9 8.9 - 10.3 mg/dL   Total Protein 7.8 6.5 - 8.1 g/dL   Albumin 3.2 (L) 3.5 - 5.0 g/dL   AST 18 15 - 41 U/L   ALT 11 (L) 14 - 54 U/L   Alkaline Phosphatase 106 38 - 126 U/L   Total Bilirubin 0.9 0.3 - 1.2 mg/dL   GFR calc non Af Amer 46 (L) >60 mL/min   GFR calc Af Amer 54 (L) >60 mL/min    Comment: (NOTE) The eGFR has been calculated using the CKD EPI equation. This calculation has not been validated in all clinical situations. eGFR's persistently <60 mL/min signify possible Chronic Kidney Disease.    Anion gap 12 5 - 15  CBC with Differential     Status: Abnormal   Collection Time: 12/22/16  3:27 PM  Result Value Ref Range   WBC 10.8 (H) 4.0 - 10.5 K/uL   RBC 4.62 3.87 - 5.11 MIL/uL   Hemoglobin 12.7 12.0 - 15.0 g/dL   HCT 36.9 36.0 - 46.0 %   MCV 79.9 78.0 - 100.0 fL   MCH 27.5 26.0 - 34.0 pg   MCHC 34.4 30.0 - 36.0 g/dL   RDW 15.1 11.5 - 15.5 %   Platelets 503 (H) 150 - 400 K/uL   Neutrophils Relative % 75 %   Neutro Abs 8.0 (H) 1.7 - 7.7 K/uL   Lymphocytes Relative 14 %   Lymphs Abs 1.6 0.7 - 4.0 K/uL   Monocytes Relative 11 %    Monocytes Absolute 1.2 (H) 0.1 - 1.0 K/uL  Eosinophils Relative 0 %   Eosinophils Absolute 0.0 0.0 - 0.7 K/uL   Basophils Relative 0 %   Basophils Absolute 0.0 0.0 - 0.1 K/uL  I-Stat CG4 Lactic Acid, ED     Status: None   Collection Time: 12/22/16  3:33 PM  Result Value Ref Range   Lactic Acid, Venous 1.73 0.5 - 1.9 mmol/L  Wound or Superficial Culture     Status: None (Preliminary result)   Collection Time: 12/22/16  5:07 PM  Result Value Ref Range   Specimen Description FOOT    Special Requests NONE    Gram Stain      ABUNDANT WBC PRESENT,BOTH PMN AND MONONUCLEAR RARE GRAM NEGATIVE RODS RARE GRAM POSITIVE COCCI IN PAIRS Performed at [facility], [place] [address], [place], [place] [address]    Culture FEW GRAM NEGATIVE RODS    Report Status PENDING   Basic metabolic panel     Status: Abnormal   Collection Time: 12/23/16  3:37 AM  Result Value Ref Range   Sodium 139 135 - 145 mmol/L   Potassium 3.9 3.5 - 5.1 mmol/L   Chloride 105 101 - 111 mmol/L   CO2 22 22 - 32 mmol/L   Glucose, Bld 102 (H) 65 - 99 mg/dL   BUN 21 (H) 6 - 20 mg/dL   Creatinine, Ser 1.13 (H) 0.44 - 1.00 mg/dL   Calcium 8.4 (L) 8.9 - 10.3 mg/dL   GFR calc non Af Amer 54 (L) >60 mL/min   GFR calc Af Amer >60 >60 mL/min    Comment: (NOTE) The eGFR has been calculated using the CKD EPI equation. This calculation has not been validated in all clinical situations. eGFR's persistently <60 mL/min signify possible Chronic Kidney Disease.    Anion gap 12 5 - 15  CBC     Status: Abnormal   Collection Time: 12/23/16  3:37 AM  Result Value Ref Range   WBC 8.1 4.0 - 10.5 K/uL   RBC 3.48 (L) 3.87 - 5.11 MIL/uL   Hemoglobin 9.2 (L) 12.0 - 15.0 g/dL   HCT 28.7 (L) 36.0 - 46.0 %   MCV 82.5 78.0 - 100.0 fL   MCH 26.4 26.0 - 34.0 pg   MCHC 32.1 30.0 - 36.0 g/dL   RDW 15.1 11.5 - 15.5 %   Platelets 360 150 - 400 K/uL  Sedimentation rate     Status: Abnormal   Collection Time: 12/23/16  3:37 AM  Result Value  Ref Range   Sed Rate 67 (H) 0 - 22 mm/hr  C-reactive protein     Status: Abnormal   Collection Time: 12/23/16  3:37 AM  Result Value Ref Range   CRP 5.1 (H) <1.0 mg/dL  Glucose, capillary     Status: Abnormal   Collection Time: 12/23/16  7:49 AM  Result Value Ref Range   Glucose-Capillary 105 (H) 65 - 99 mg/dL   Comment 1 Notify RN   Glucose, capillary     Status: Abnormal   Collection Time: 12/24/16  8:02 AM  Result Value Ref Range   Glucose-Capillary 101 (H) 65 - 99 mg/dL   Comment 1 Notify RN    Comment 2 Document in Chart   Basic metabolic panel     Status: Abnormal   Collection Time: 12/24/16  9:42 AM  Result Value Ref Range   Sodium 140 135 - 145 mmol/L   Potassium 3.6 3.5 - 5.1 mmol/L   Chloride 112 (H) 101 - 111 mmol/L  CO2 20 (L) 22 - 32 mmol/L   Glucose, Bld 116 (H) 65 - 99 mg/dL   BUN 11 6 - 20 mg/dL   Creatinine, Ser 0.95 0.44 - 1.00 mg/dL   Calcium 8.0 (L) 8.9 - 10.3 mg/dL   GFR calc non Af Amer >60 >60 mL/min   GFR calc Af Amer >60 >60 mL/min    Comment: (NOTE) The eGFR has been calculated using the CKD EPI equation. This calculation has not been validated in all clinical situations. eGFR's persistently <60 mL/min signify possible Chronic Kidney Disease.    Anion gap 8 5 - 15    Dg Tibia/fibula Left  Result Date: 12/23/2016 CLINICAL DATA:  Delayed wound healing at the left lower leg. Initial encounter. EXAM: LEFT TIBIA AND FIBULA - 2 VIEW COMPARISON:  Left ankle radiographs performed 09/09/2016 FINDINGS: The left lower leg wound appears to have increased in size. No osseous erosions are seen. There is no evidence of fracture or dislocation. The knee joint is grossly unremarkable. No knee joint effusion is seen. IMPRESSION: No osseous erosions seen. Left lower leg wound appears to have increased in size. Electronically Signed   By: Garald Balding M.D.   On: 12/23/2016 03:07   Dg Tibia/fibula Right  Result Date: 12/23/2016 CLINICAL DATA:  Delayed wound  healing at the right lower leg. Large ulcerations, with severe pain. Subsequent encounter. EXAM: RIGHT TIBIA AND FIBULA - 2 VIEW COMPARISON:  Right tibia/fibula radiographs performed 09/09/2016 FINDINGS: An enlarging soft tissue defect is noted involving the right ankle and lower leg. No definite osseous erosions are seen at this time. The tibia and fibula appear grossly intact. The knee joint is grossly unremarkable. IMPRESSION: No definite osseous erosions. Enlarging soft tissue defect involving the right ankle and lower leg. Electronically Signed   By: Garald Balding M.D.   On: 12/23/2016 03:04   Dg Ankle Complete Left  Result Date: 12/23/2016 CLINICAL DATA:  Delayed healing of left lower leg wound. Initial encounter. EXAM: LEFT ANKLE COMPLETE - 3+ VIEW COMPARISON:  Left ankle radiographs performed 09/09/2016 FINDINGS: A persistent large wound is noted at the medial aspect of the left ankle; it now appears to extend to the lateral and posterior aspects of the ankle. Edema tracks about Kager's fat pad. There is no evidence of osseous erosion. The ankle mortise is unremarkable in appearance. The subtalar joint is grossly unremarkable. IMPRESSION: Persistent large wound at the medial aspect of the left ankle, now extending to the lateral and posterior aspects of the ankle. Edema tracking about Kager's fat pad. No osseous erosions seen. Electronically Signed   By: Garald Balding M.D.   On: 12/23/2016 03:03   Mr Tibia Fibula Right W Wo Contrast  Result Date: 12/24/2016 CLINICAL DATA:  She continues to use cocaine which is unfortunately universally contaminated with levimasole and she has developed consequential worsening ulceration and necrosis of soft tissues in her legs and toes to the point where there may be bone exposed now. On exam this morning we came to see her she was extremely tearful and crying and upset and in pain. She states that the ulcers are incredibly painful. I again explained to her that  her cocaine use was driving the pathology. EXAM: MRI OF LOWER RIGHT EXTREMITY WITHOUT AND WITH CONTRAST MRI OF LOWER LEFT EXTREMITY WITHOUT AND WITH CONTRAST TECHNIQUE: Multiplanar, multisequence MR imaging of the right lower leg was performed both before and after administration of intravenous contrast. Multiplanar, multisequence MR imaging of the left  lower leg was performed both before and after administration of intravenous contrast. CONTRAST:  25m MULTIHANCE GADOBENATE DIMEGLUMINE 529 MG/ML IV SOLN COMPARISON:  None. FINDINGS: Bones/Joint/Cartilage No focal marrow signal abnormality of bilateral tibia and fibula. No fracture or dislocation. Normal alignment. No joint effusion. No aggressive osseous lesion. No periosteal reaction or bone destruction. Ligaments Collateral ligaments are intact. Muscles and Tendons Muscle edema in the right distal soleus muscle with mild enhancement. Muscle edema in the right flexor hallucis longus muscle. Mild muscle edema in the left distal soleus muscle to lesser extent than the right. Proximal right Achilles tendon is severely attenuated and not visualized and may reflect severe tendinosis versus complete tear. Soft tissue Cutaneous ulcers involving the posterior distal lower leg bilaterally, right worse than left. No soft tissue mass. IMPRESSION: 1. Cutaneous ulcers involving the posterior distal lower leg bilaterally, right worse than left. 2. Muscle edema in the right distal soleus muscle with mild enhancement. Muscle edema in the right flexor hallucis longus muscle. Mild muscle edema in the left distal soleus muscle to lesser extent than the right. Findings are most concerning for myositis which may be secondary to an inflammatory or infectious etiology. 3. Proximal right Achilles tendon is severely attenuated and not visualized and may reflect severe tendinosis versus complete tear. 4. No drainable fluid collection to suggest an abscess. 5. No osseous abnormality to  suggest osteomyelitis. Electronically Signed   By: HKathreen Devoid  On: 12/24/2016 09:10   Mr Tibia Fibula Left W Wo Contrast  Result Date: 12/24/2016 CLINICAL DATA:  She continues to use cocaine which is unfortunately universally contaminated with levimasole and she has developed consequential worsening ulceration and necrosis of soft tissues in her legs and toes to the point where there may be bone exposed now. On exam this morning we came to see her she was extremely tearful and crying and upset and in pain. She states that the ulcers are incredibly painful. I again explained to her that her cocaine use was driving the pathology. EXAM: MRI OF LOWER RIGHT EXTREMITY WITHOUT AND WITH CONTRAST MRI OF LOWER LEFT EXTREMITY WITHOUT AND WITH CONTRAST TECHNIQUE: Multiplanar, multisequence MR imaging of the right lower leg was performed both before and after administration of intravenous contrast. Multiplanar, multisequence MR imaging of the left lower leg was performed both before and after administration of intravenous contrast. CONTRAST:  840mMULTIHANCE GADOBENATE DIMEGLUMINE 529 MG/ML IV SOLN COMPARISON:  None. FINDINGS: Bones/Joint/Cartilage No focal marrow signal abnormality of bilateral tibia and fibula. No fracture or dislocation. Normal alignment. No joint effusion. No aggressive osseous lesion. No periosteal reaction or bone destruction. Ligaments Collateral ligaments are intact. Muscles and Tendons Muscle edema in the right distal soleus muscle with mild enhancement. Muscle edema in the right flexor hallucis longus muscle. Mild muscle edema in the left distal soleus muscle to lesser extent than the right. Proximal right Achilles tendon is severely attenuated and not visualized and may reflect severe tendinosis versus complete tear. Soft tissue Cutaneous ulcers involving the posterior distal lower leg bilaterally, right worse than left. No soft tissue mass. IMPRESSION: 1. Cutaneous ulcers involving the  posterior distal lower leg bilaterally, right worse than left. 2. Muscle edema in the right distal soleus muscle with mild enhancement. Muscle edema in the right flexor hallucis longus muscle. Mild muscle edema in the left distal soleus muscle to lesser extent than the right. Findings are most concerning for myositis which may be secondary to an inflammatory or infectious etiology. 3. Proximal  right Achilles tendon is severely attenuated and not visualized and may reflect severe tendinosis versus complete tear. 4. No drainable fluid collection to suggest an abscess. 5. No osseous abnormality to suggest osteomyelitis. Electronically Signed   By: Kathreen Devoid   On: 12/24/2016 09:10    Review of Systems  Constitutional: Positive for fever.  HENT: Negative.   Eyes: Negative.   Respiratory: Negative.   Cardiovascular: Negative.   Gastrointestinal: Negative.   Genitourinary: Negative.   Musculoskeletal: Negative.   Skin:       Leg wound  Psychiatric/Behavioral: Positive for depression and substance abuse.   Blood pressure (!) 144/77, pulse 100, temperature 99.9 F (37.7 C), temperature source Oral, resp. rate 17, height 5' 1.5" (1.562 m), weight 40 kg (88 lb 2.9 oz), SpO2 98 %. Physical Exam  Constitutional: She appears distressed.  HENT:  Head: Normocephalic.  Helix distortion from wounds in past  Eyes: Pupils are equal, round, and reactive to light.  Cardiovascular: Normal rate.   Respiratory: Effort normal.  GI: Soft.  Musculoskeletal: She exhibits edema, tenderness and deformity.       Feet:  Neurological: She is alert.  Psychiatric: She has a normal mood and affect.  Complains of pain in legs    Assessment/Plan: May benefit from hydrotherapy.  Wound care per Wound nurses. Likely needs amputations. Patient not ready for this option.  Elevate as able.  Nutrition maximization with vitamins and protein supplements.  Wallace Going 12/24/2016, 11:49 AM

## 2016-12-25 DIAGNOSIS — T148XXD Other injury of unspecified body region, subsequent encounter: Secondary | ICD-10-CM

## 2016-12-25 LAB — CBC
HCT: 25.8 % — ABNORMAL LOW (ref 36.0–46.0)
Hemoglobin: 8.5 g/dL — ABNORMAL LOW (ref 12.0–15.0)
MCH: 27 pg (ref 26.0–34.0)
MCHC: 32.9 g/dL (ref 30.0–36.0)
MCV: 81.9 fL (ref 78.0–100.0)
Platelets: 334 10*3/uL (ref 150–400)
RBC: 3.15 MIL/uL — ABNORMAL LOW (ref 3.87–5.11)
RDW: 15.3 % (ref 11.5–15.5)
WBC: 6.9 10*3/uL (ref 4.0–10.5)

## 2016-12-25 LAB — AEROBIC CULTURE W GRAM STAIN (SUPERFICIAL SPECIMEN)

## 2016-12-25 LAB — BASIC METABOLIC PANEL
Anion gap: 7 (ref 5–15)
BUN: 13 mg/dL (ref 6–20)
CALCIUM: 8.2 mg/dL — AB (ref 8.9–10.3)
CO2: 21 mmol/L — ABNORMAL LOW (ref 22–32)
Chloride: 113 mmol/L — ABNORMAL HIGH (ref 101–111)
Creatinine, Ser: 0.88 mg/dL (ref 0.44–1.00)
GFR calc Af Amer: >60 mL/min (ref >60)
Glucose, Bld: 102 mg/dL — ABNORMAL HIGH (ref 65–99)
Potassium: 3.5 mmol/L (ref 3.5–5.1)
Sodium: 141 mmol/L (ref 135–145)

## 2016-12-25 LAB — AEROBIC CULTURE  (SUPERFICIAL SPECIMEN)

## 2016-12-25 LAB — MRSA PCR SCREENING: MRSA BY PCR: POSITIVE — AB

## 2016-12-25 LAB — GLUCOSE, CAPILLARY: GLUCOSE-CAPILLARY: 102 mg/dL — AB (ref 65–99)

## 2016-12-25 LAB — HIV ANTIBODY (ROUTINE TESTING W REFLEX): HIV SCREEN 4TH GENERATION: NONREACTIVE

## 2016-12-25 MED ORDER — CLONIDINE HCL 0.1 MG PO TABS
0.1000 mg | ORAL_TABLET | Freq: Once | ORAL | Status: AC
  Administered 2016-12-25: 0.1 mg via ORAL
  Filled 2016-12-25: qty 1

## 2016-12-25 NOTE — Progress Notes (Signed)
   Subjective: Ms. Nevares was seen and evaluated at bedside. Still in pain but improved. Discussed importance of cocaine cessation so that she may be able hopefully have amputations in the future as surgeons wont touch her with active and continued use. Discussed facility placement and she is agreeable. Discussed possibility of hydrotherapy and pt states "Ill do what ever it takes to fix me" however discussed that this procedure will NOT heal her wounds but may clean them a bit more.   Objective:  Vital signs in last 24 hours: Vitals:   12/24/16 2100 12/25/16 0540 12/25/16 0615 12/25/16 0700  BP: (!) 199/96 (!) 198/93 (!) 182/100 (!) 174/90  Pulse: (!) 110 (!) 103    Resp: 16 19    Temp: 98.8 F (37.1 C) 98.6 F (37 C)    TempSrc: Oral     SpO2: 95% 98%    Weight:      Height:       General: Cachectic and chronically ill appearing african [place] female.  HENT: No conjunctival injection, icterus or ptosis. Poor dentition. Pulmonary: Unlabored breathing. No audible wheezes.  Abdomen: Non-distended. No guarding or rigidity Extremities: Large BL LE wounds covered in dressings. Psych: Mood depressed and anxious and affect was mood congruent. Responds to questions appropriately. Less tearful than on prior exams.   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 Ms. Magloire is debilitated by her extremely large and deep lower extremity wounds and is chronically in considerable pain. MRI neg for osteo. Seen by plastics yesterday who will not amputate due to patients considerable barriers although did rec hydrotherapy. Discussed this with pt as this will likely be a very painful  procedure and she is unsure of if she wants to proceed with this therapy. She was again counseled on the necessity of cocaine cessation as this is the primary cause of her disease. Noted some understanding.  -?Hydrotherapy - awaiting patient decision. She will need analgesic pre-procedure. -Oxycodone for pain -Received Vanc and Zosyn in ED. Holding abx at this time  -Wound care -PT/OT of course recommend SNF. She is severely limited due to her extensive lower extremity wounds.   Hypertension: Hypertensive on admission with SBP in 190's-200's. Was controled with resuming her home meds however hypertensive this morning again 180's-190's/90's-100's. She has history of non-compliance. -Lisinopril 40 mg daily  -Clonidine 0.33m x 1 this am -Will start Amlodipine 5.  Dispo: Anticipated discharge unclear. Appreciate SW assistance in placement of this patient.  Chayce [place], DO 12/25/2016, 11:30 AM Pager: [phone]

## 2016-12-26 DIAGNOSIS — M60861 Other myositis, right lower leg: Secondary | ICD-10-CM

## 2016-12-26 DIAGNOSIS — L97813 Non-pressure chronic ulcer of other part of right lower leg with necrosis of muscle: Secondary | ICD-10-CM

## 2016-12-26 DIAGNOSIS — D649 Anemia, unspecified: Secondary | ICD-10-CM

## 2016-12-26 DIAGNOSIS — L97823 Non-pressure chronic ulcer of other part of left lower leg with necrosis of muscle: Secondary | ICD-10-CM

## 2016-12-26 DIAGNOSIS — M60862 Other myositis, left lower leg: Secondary | ICD-10-CM

## 2016-12-26 LAB — HEPATITIS B SURFACE ANTIGEN: HEP B S AG: NEGATIVE

## 2016-12-26 LAB — GLUCOSE, CAPILLARY: Glucose-Capillary: 91 mg/dL (ref 65–99)

## 2016-12-26 LAB — HCV COMMENT:

## 2016-12-26 LAB — HEPATITIS C ANTIBODY (REFLEX): HCV Ab: <0.1 s/co ratio (ref 0.0–0.9)

## 2016-12-26 MED ORDER — AMLODIPINE BESYLATE 5 MG PO TABS
5.0000 mg | ORAL_TABLET | Freq: Every day | ORAL | Status: DC
  Administered 2016-12-26 – 2016-12-28 (×3): 5 mg via ORAL
  Filled 2016-12-26: qty 2
  Filled 2016-12-26 (×3): qty 1

## 2016-12-26 NOTE — Progress Notes (Signed)
   Subjective: Ms. Hebdon was seen and evaluated today at bedside. She notes her pain is much more tolerable today than at time of admission. Still debating whether or not shes interested in hydrotherapy. Still interested in SNF placement after d/c.   Objective:  Vital signs in last 24 hours: Vitals:   12/25/16 0700 12/25/16 1500 12/25/16 2135 12/26/16 0636  BP: (!) 174/90 (!) 145/104 (!) 175/86 (!) 163/81  Pulse:  99 (!) 102 90  Resp:  (!) 99 19 18  Temp:  98.2 F (36.8 C) 99.3 F (37.4 C) 98.6 F (37 C)  TempSrc:  Oral Oral Oral  SpO2:   95% 97%  Weight:      Height:       General: Cachectic and chronically-ill appearing african [place] female. In no acute distress. Resting comfortably in bed.  HENT: [place]/AT. BL ear helices missing. No conjunctival injection, icterus or ptosis. Cardiovascular: Regular rate and rhythm. No murmur or rub appreciated. Pulmonary: CTA BL, no wheezing, crackles or rhonchi appreciated. Unlabored breathing.  Abdomen: Soft, non-tender. +bowel sounds.  Extremities: Large BL LE wounds covered in dressings. Psych: Mood depressed and affect was mood congruent. Responds to questions appropriately. Less tearful today, seems more optimistic.   Assessment/Plan: 56 y/o F with hx of cocaine abuse disorder, cocaine-associated (levamisole) vasculitis and chronic lower extremity wounds secondary to the above who presents with worsening pain of her chronic wounds. Pain is improved since admission with PO Oxy IR and MRI negative for osteomyelitis. Seen by plastics who note patient is not ready for amputation at this time. Currently medically stable for SNF placement.   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 Califf is more comfortable today and appears less distressed. Still uncertain if she wishes to pursue hydrotherapy - her understanding of this procedure seems poor, she states "maybe water will heal my wounds." It was explained that this would not heal them, but would help in debriding them.  -Oxy 15m IR Q4H PRN -Wound care on board, appreciate their assistance in management of this patient! -PT recommends SNF - believe patient would benefit greatly with facility placement -SW on board, appreciate placement assistance  Hypertension: On Lisinopril 464mat home however intermittently compliant with this. SBP 190's-200's on admission however resolved with resuming Lisinopril. Pt again developed HTN yesterday and will be started on Amlodipine 5 mg today.  -Amlodipine 5 mg Qdaily  Dispo: Anticipated discharge pending hopeful SNF placement.   Arley Garant, DO 12/26/2016, 7:12 AM Pager: [phone]

## 2016-12-26 NOTE — Clinical Social Work Note (Signed)
Clinical Social Work Assessment  Patient Details  Name: CAILEY TRIGUEROS MRN: [id number] Date of Birth: [birth year]  Date of referral:  12/26/16               Reason for consult:  Facility Placement                Permission sought to share information with:  Facility Sport and exercise psychologist, Family Supports Permission granted to share information::  Yes, Verbal Permission Granted  Name::        Agency::  SNFs  Relationship::     Contact Information:     Housing/Transportation Living arrangements for the past 2 months:  International Paper of Information:  Patient Patient Interpreter Needed:  None Criminal Activity/Legal Involvement Pertinent to Current Situation/Hospitalization:  No - Comment as needed Significant Relationships:  None Lives with:  Other (Comment) Do you feel safe going back to the place where you live?  No Need for family participation in patient care:  No (Coment)  Care giving concerns:  CSW received consult for possible SNF placement at time of discharge. CSW spoke with patient regarding PT recommendation of SNF placement at time of discharge. Patient expressed understanding of PT recommendation and is agreeable to SNF placement at time of discharge. Patient lives at a [place] and has had home health before that doesn't help. However, patient states she is unwilling to give up her Medicaid check because she has to pay rent, but that she has no one to help. CSW explained potential payor source barrier. CSW to continue to follow and assist with discharge planning needs.   Social Worker assessment / plan:  CSW spoke with patient concerning possibility of rehab at [facility] before returning home.  Employment status:  Disabled (Comment on whether or not currently receiving Disability) Insurance information:  Medicaid In [place] PT Recommendations:  [place] / Referral to community resources:  [place]  Patient/Family's Response  to care:  Patient recognizes need for rehab before returning home and is agreeable to a SNF if bed is found.  Patient/Family's Understanding of and Emotional Response to Diagnosis, Current Treatment, and Prognosis:  Patient/family is realistic regarding therapy needs and expressed being hopeful for SNF placement. Patient aware of her medical diagnoses. Patient expressed understanding of CSW role and discharge process. No questions/concerns about plan or treatment.    Emotional Assessment Appearance:  Appears older than stated age Attitude/Demeanor/Rapport:  Other, Lethargic (Short responses) Affect (typically observed):  Irritable Orientation:  Oriented to Self, Oriented to Place, Oriented to Situation, Oriented to  Time Alcohol / Substance use:  Illicit Drugs Psych involvement (Current and /or in the community):  No (Comment)  Discharge Needs  Concerns to be addressed:  Care Coordination Readmission within the last 30 days:  No Current discharge risk:  Physical Impairment, Substance Abuse Barriers to Discharge:  Continued Medical Work up   [employer], [place] 12/26/2016, 4:35 PM

## 2016-12-26 NOTE — NC FL2 (Signed)
[place] LEVEL OF CARE SCREENING TOOL     IDENTIFICATION  Patient Name: [name] Birthdate: [birth year] Sex: female Admission Date (Current Location): 12/22/2016  [facility] and [place] Number:  Herbalist and Address:  [facility] [place], [place] [address], [place], [place] [address]      Provider Number: [id number]  Attending Physician Name and Address:  Sid Falcon, MD  Relative Name and Phone Number:       Current Level of Care: Hospital Recommended Level of Care: [place] Prior Approval Number:    Date Approved/Denied:   PASRR Number: [id number]  Discharge Plan: SNF    Current Diagnoses: Patient Active Problem List   Diagnosis Date Noted  . Wound healing, delayed   . Osteomyelitis ([place])   . Ulcer with gangrene, with necrosis of muscle ([place])   . Cognitive impairment   . Wound infection 12/22/2016  . 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 ([place]) 06/04/2015  . Protein-calorie malnutrition, severe ([place]) 01/15/2015  . MDD (major depressive disorder), recurrent episode, severe ([place]) 10/16/2014  . Cocaine use disorder, severe, dependence ([place]) 10/10/2014  . Substance induced mood disorder ([place]) 10/09/2014  . Acute pericarditis 03/31/2014  . 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 Essential hypertension 02/26/2010    Orientation RESPIRATION BLADDER Height & Weight     Self, Time, Situation, Place  Normal Continent Weight: 40 kg (88 lb 2.9 oz) Height:  5' 1.5" (156.2 cm)  BEHAVIORAL SYMPTOMS/MOOD NEUROLOGICAL BOWEL NUTRITION STATUS      Continent Diet (Please see DC Summary)  AMBULATORY STATUS COMMUNICATION OF NEEDS Skin   Limited Assist Verbally PU Stage and Appropriate Care (Pressure ulcer stage II on sacrum; Wound on venous stasis)                       Personal Care Assistance Level of Assistance  Bathing, Feeding, Dressing Bathing Assistance: Limited assistance Feeding assistance:  Independent Dressing Assistance: Limited assistance     Functional Limitations Info             SPECIAL CARE FACTORS FREQUENCY  PT (By licensed PT)     PT Frequency: 5x/week              Contractures      Additional Factors Info  Code Status, Allergies Code Status Info: Full Allergies Info: Acetaminophen, Other           Current Medications (12/26/2016):  This is the current hospital active medication list Current Facility-Administered Medications  Medication Dose Route Frequency Provider Last Rate Last Dose  . amLODipine (NORVASC) tablet 5 mg  5 mg Oral Daily Bethany Molt, DO   5 mg at 12/26/16 0947  . enoxaparin (LOVENOX) injection 30 mg  30 mg Subcutaneous Q24H Cecilio Asper Batchelder, RPH   30 mg at 12/25/16 2246  . feeding supplement (BOOST / RESOURCE BREEZE) liquid 1 Container  1 Container Oral TID BM Burgess Estelle, MD   1 Container at 12/26/16 1400  . lisinopril (PRINIVIL,ZESTRIL) tablet 40 mg  40 mg Oral Daily Bethany Molt, DO   40 mg at 12/26/16 0947  . multivitamins with iron tablet 1 tablet  1 tablet Oral Daily Burgess Estelle, MD   1 tablet at 12/26/16 0947  . oxyCODONE (Oxy IR/ROXICODONE) immediate release tablet 10 mg  10 mg Oral Q4H PRN Burgess Estelle, MD   10 mg at 12/26/16 1307  . promethazine (PHENERGAN) tablet 12.5 mg  12.5 mg Oral Q6H PRN Burgess Estelle, MD      . senna (SENOKOT) tablet 8.6 mg  1 tablet Oral Daily PRN Burgess Estelle, MD      . sodium chloride flush (NS) 0.9 % injection 3 mL  3 mL Intravenous Q12H Burgess Estelle, MD   3 mL at 12/26/16 0948     Discharge Medications: Please see discharge summary for a list of discharge medications.  Relevant Imaging Results:  Relevant Lab Results:   Additional Information [id number]  Benard Halsted, LCSWA

## 2016-12-26 NOTE — Progress Notes (Signed)
Offered to do patient's dressing change, she declined and said she wanted to do herself, I gave her all supplies to do so.

## 2016-12-27 LAB — FERRITIN: FERRITIN: 21 ng/mL (ref 11–307)

## 2016-12-27 LAB — GLUCOSE, CAPILLARY: GLUCOSE-CAPILLARY: 89 mg/dL (ref 65–99)

## 2016-12-27 LAB — CULTURE, BLOOD (ROUTINE X 2)
Culture: NO GROWTH
Culture: NO GROWTH

## 2016-12-27 LAB — IRON AND TIBC
Iron: 18 ug/dL — ABNORMAL LOW (ref 28–170)
Saturation Ratios: 8 % — ABNORMAL LOW (ref 10.4–31.8)
TIBC: 232 ug/dL — ABNORMAL LOW (ref 250–450)
UIBC: 214 ug/dL

## 2016-12-27 MED ORDER — FERROUS SULFATE 325 (65 FE) MG PO TABS
325.0000 mg | ORAL_TABLET | Freq: Two times a day (BID) | ORAL | Status: DC
  Administered 2016-12-27 – 2016-12-28 (×2): 325 mg via ORAL
  Filled 2016-12-27 (×2): qty 1

## 2016-12-27 NOTE — Progress Notes (Signed)
   Subjective: [name] was seen and evaluated today at bedside. Pain more tolerable than yesterday however still quite significant with movement. No other complaints. Awaiting SNF placement. She has not noticed any hematochezia or hematuria however does not inspect for this.   Objective:  Vital signs in last 24 hours: Vitals:   12/26/16 1356 12/26/16 2005 12/27/16 0500 12/27/16 0829  BP: (!) 156/88 (!) 163/86 (!) 183/88 (!) 173/104  Pulse: 90 97 80   Resp: 20 19 18    Temp: 98.2 F (36.8 C) 99.1 F (37.3 C) 98.3 F (36.8 C)   TempSrc: Oral Oral Axillary   SpO2: 99% 98% 100%   Weight:      Height:       General: Cachectic and chronically-ill appearing african [place] female. Sleeping comfortably without distress. HENT: [place]/AT. BL ear helices absent. Cardiovascular: Regular rate and rhythm. No murmur or rub appreciated. Pulmonary: Faint basilar crackles however unlabored breathing.  Abdomen: Soft, non-tender. +bowel sounds.  Psych: Mood depressed but improved and affect was mood congruent. Responds to questions appropriately. Appears optimistic. Appreciative of care.   Assessment/Plan: 56 y/o F with hx of cocaine abuse disorder, cocaine-associated (levamisole) vasculitis and chronic lower extremity wounds secondary to the above who presents with worsening pain of her chronic wounds. Pain is improved since admission with PO Oxy IR and MRI negative for osteomyelitis. Seen by plastics who note patient is not ready for amputation at this time. Currently medically stable for SNF placement.   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 Wound care on  board and appreciate their assistance in management of this patients large chronic wounds. Awaiting SNF placement, believe pt would benefit greatly from this however pt unwilling to put her medicaid check towards SNF as per SW note yesterday.  -Oxy 42m IR Q4H PRN -Wound care on board -PT recommends SNF - believe patient would benefit greatly with facility placement -SW on board assisting with placement.  Hypertension: On Lisinopril 450mat home and was started on Amlodipine 5 mg yesterday. Still hypertensive however in setting of significant pain. Will continue current therapy.   -Amlodipine 5 mg Qdaily  Normocytic Anemia: Hb 8.5 yesterday, was 9.2 the day prior and normal on admission. She denies any blood loss however does not often inspect for this. Anemia studies show low iron deficiency and decreased saturation, c/w iron deficiency anemia. Will be started on oral iron replacement and will observe for blood loss.   Dispo: Anticipated discharge pending hopeful SNF placement.   Cristina Whiteside, DO 12/27/2016, 12:15 PM Pager: [phone]

## 2016-12-28 DIAGNOSIS — G3184 Mild cognitive impairment, so stated: Secondary | ICD-10-CM

## 2016-12-28 LAB — GLUCOSE, CAPILLARY: GLUCOSE-CAPILLARY: 99 mg/dL (ref 65–99)

## 2016-12-28 IMAGING — CR DG HAND COMPLETE 3+V*R*
3 series · 3 of 3 positions shown · non-contrast
Comparison: And radiograph 10/29/2010

CLINICAL DATA: Evaluate for infection of the right hand.

EXAM:
RIGHT HAND - COMPLETE 3+ VIEW

[PA]
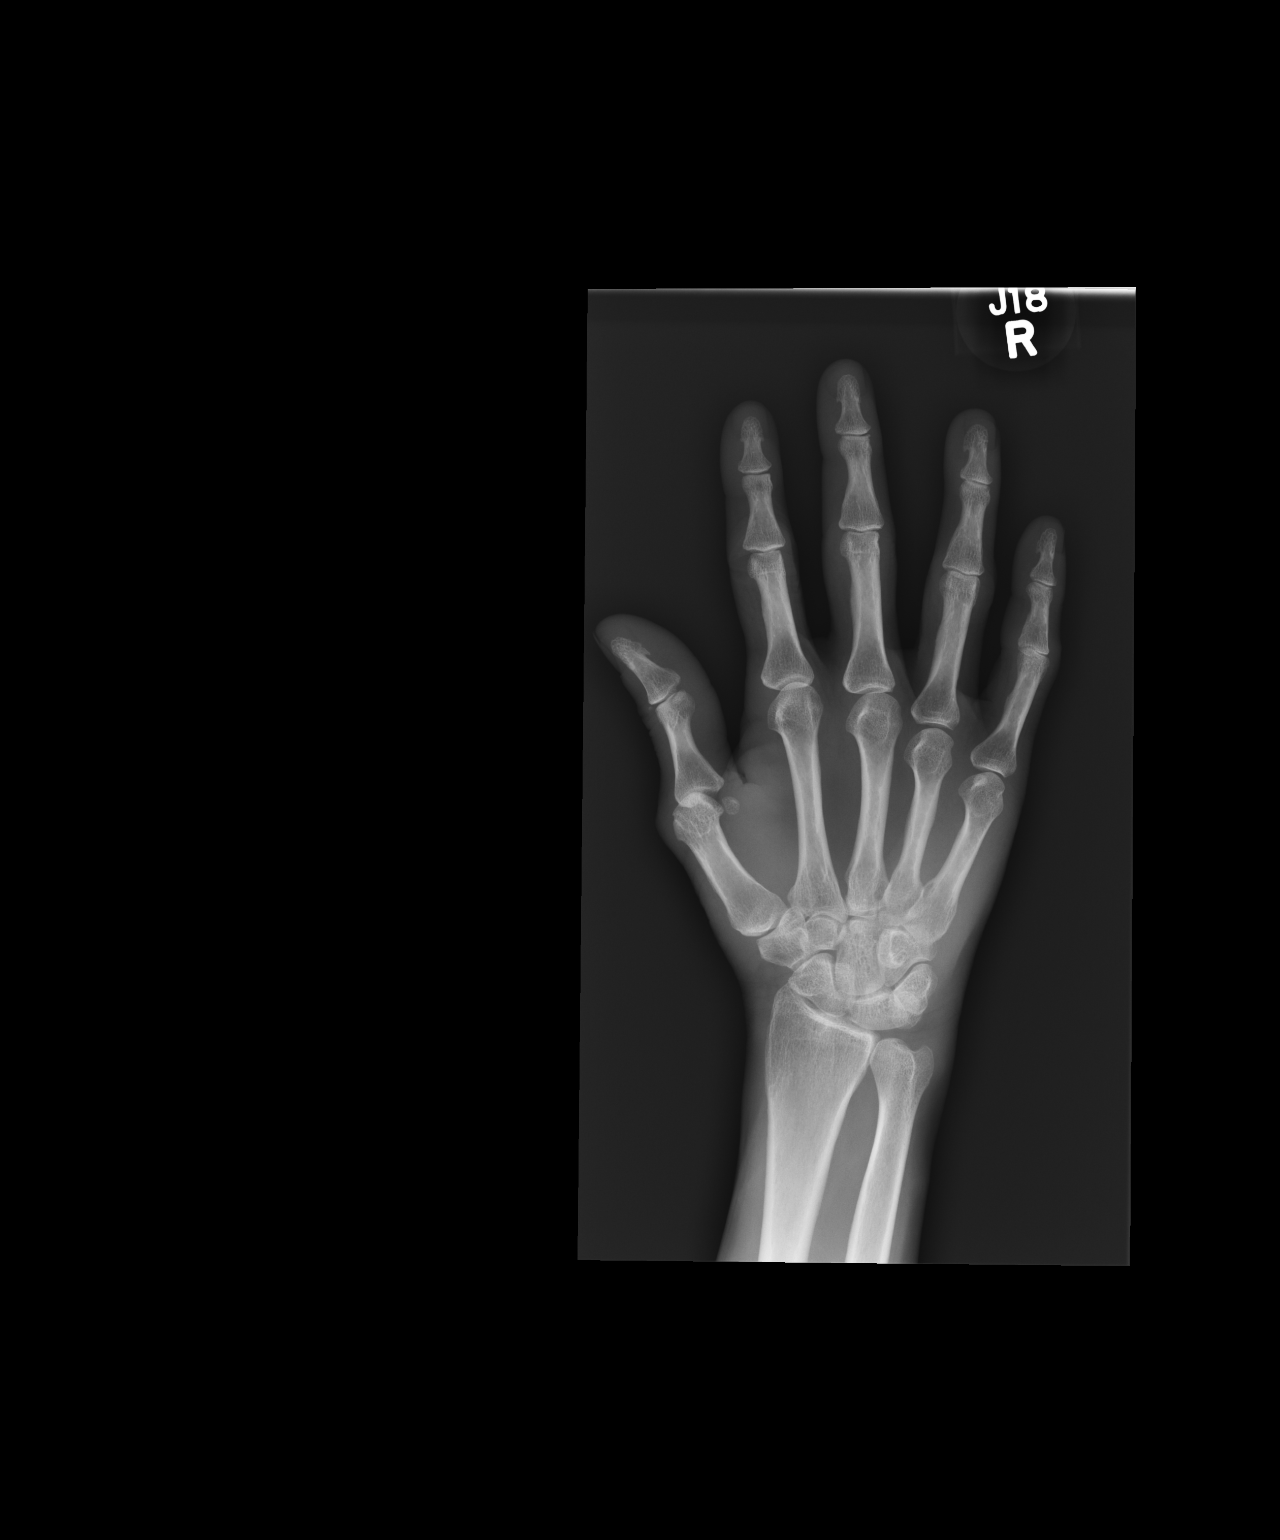

[lateral]
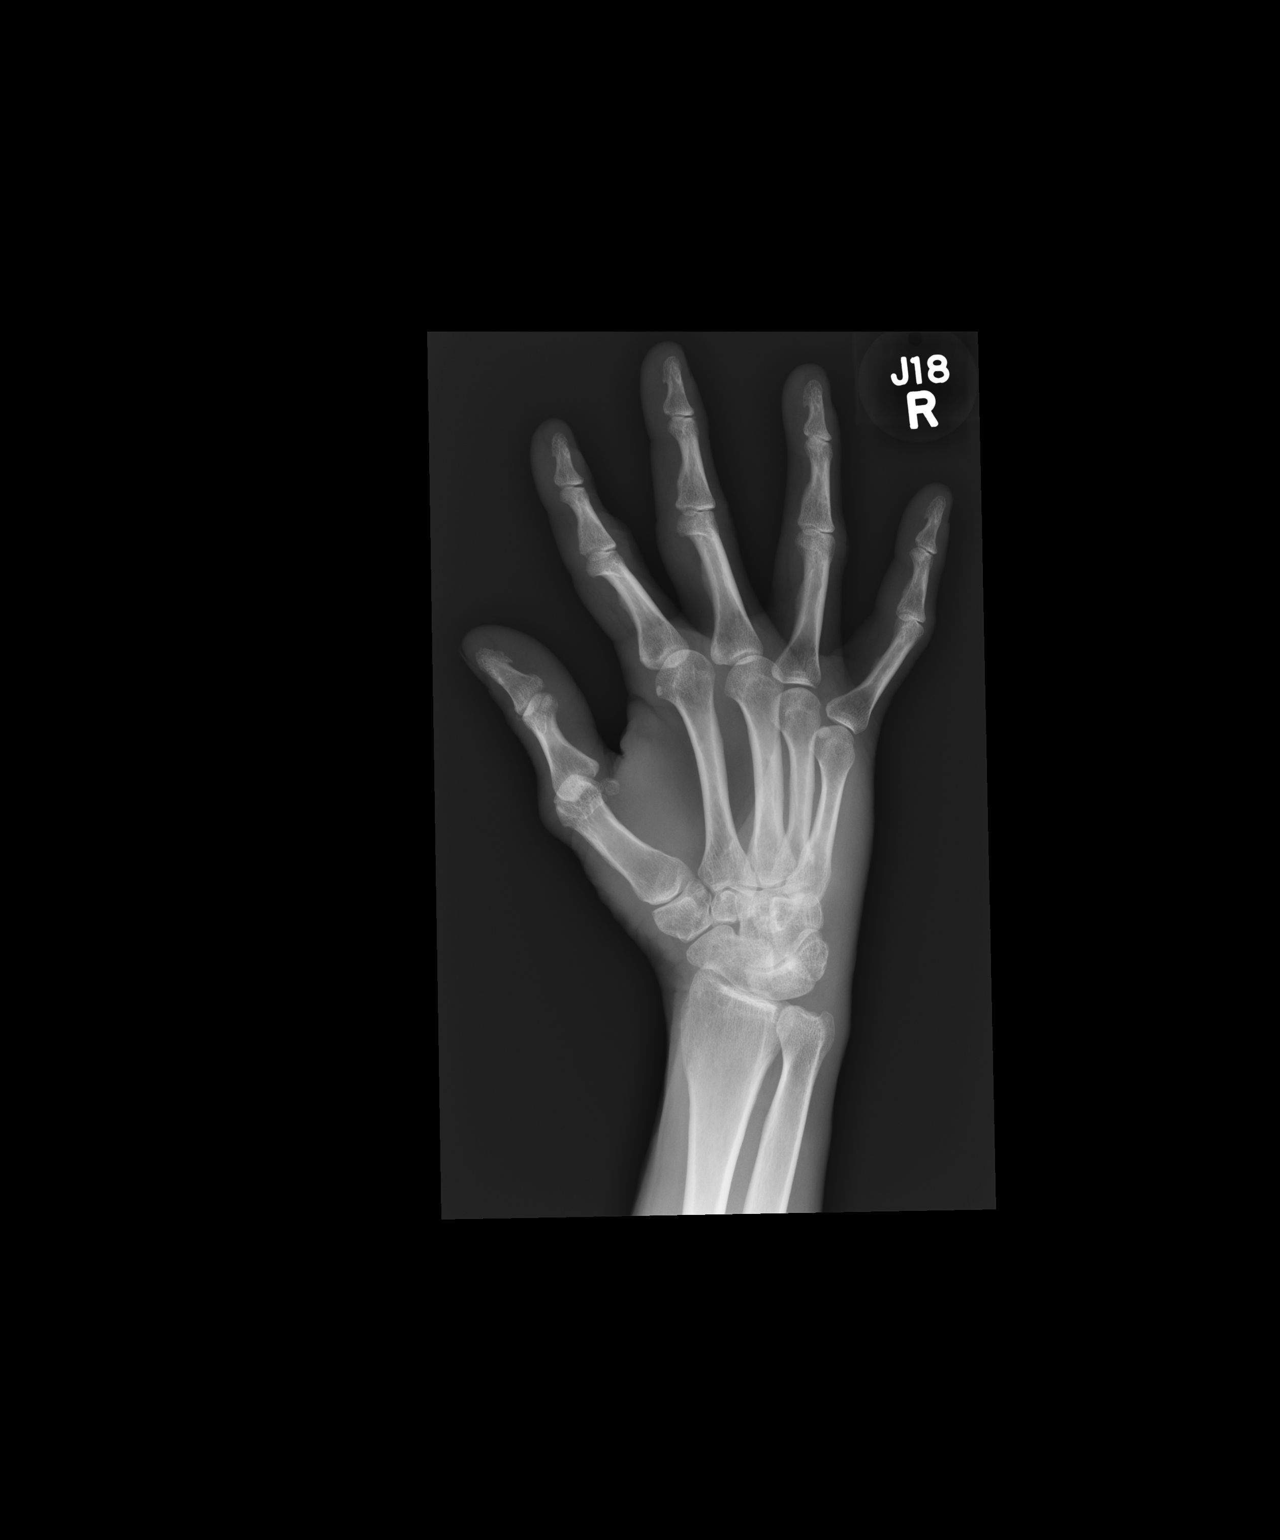

[pa obl]
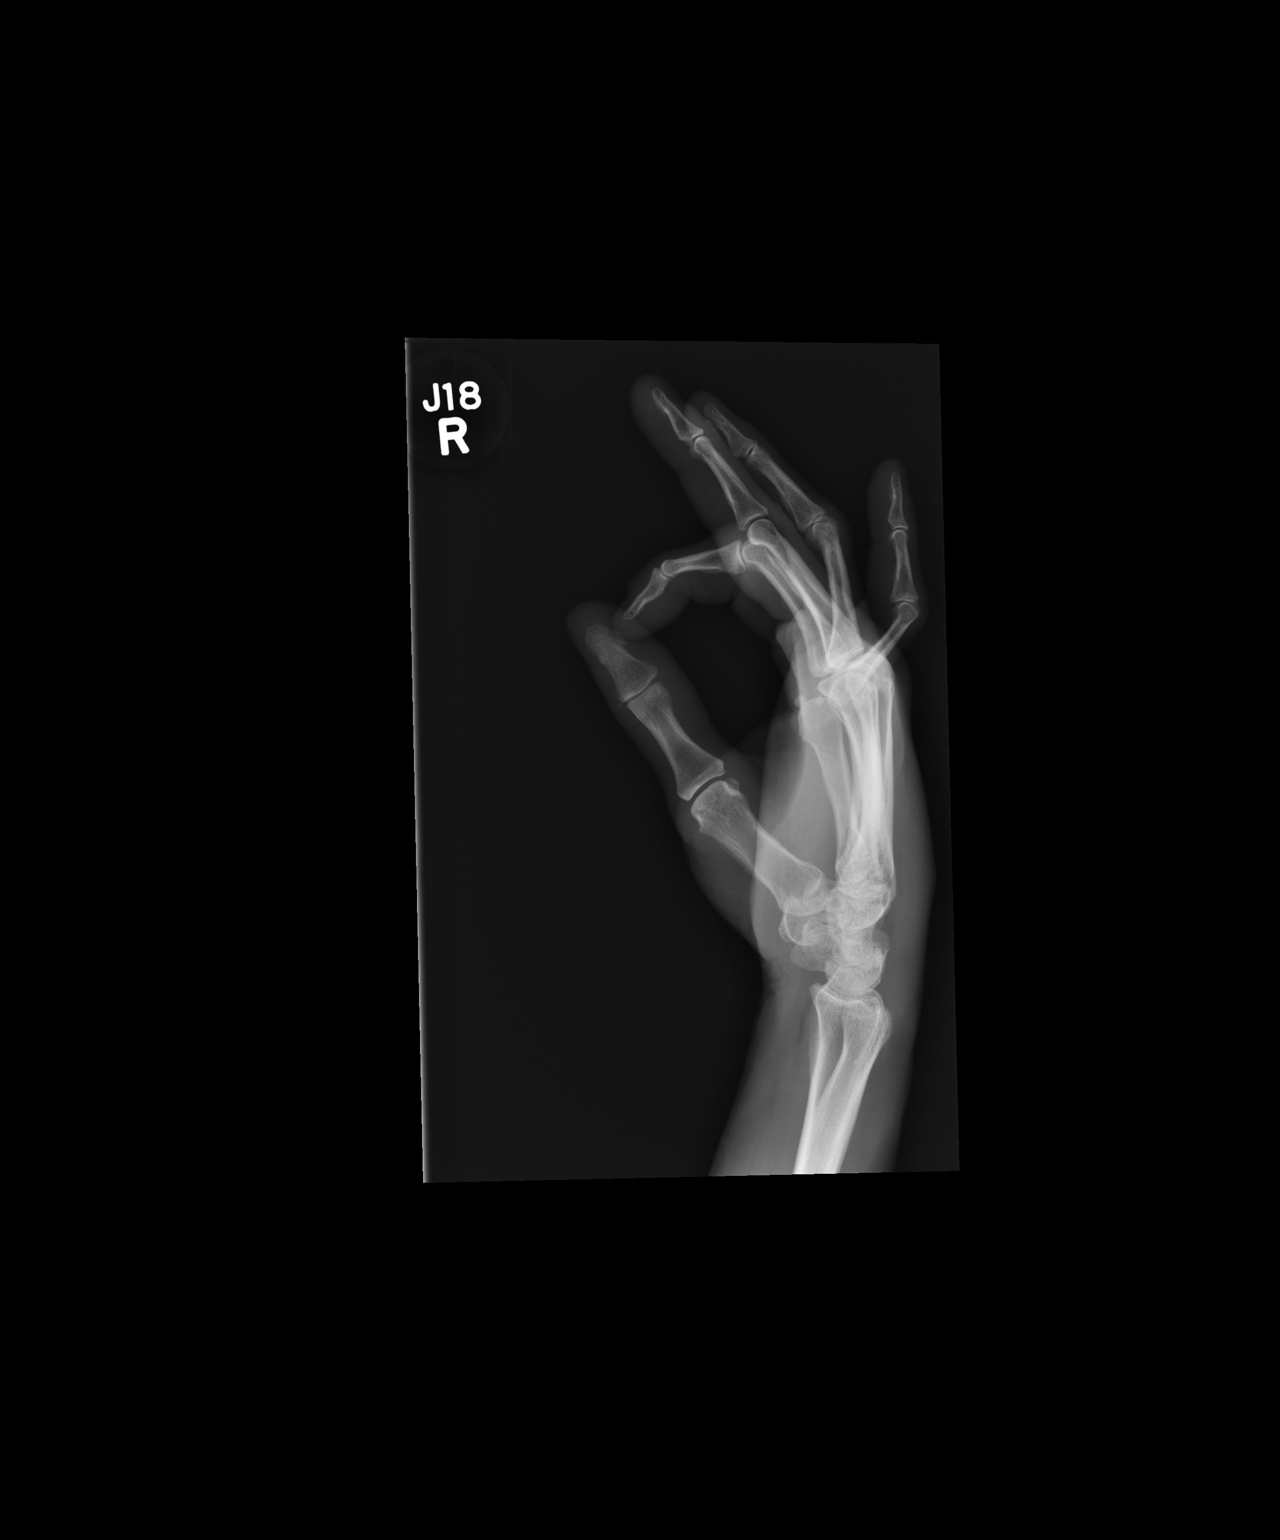

[3 of 3 positions shown; findings below may reference images not displayed]

FINDINGS: Normal anatomic alignment. No evidence for acute fracture or
dislocation. No significant soft tissue swelling. No definite
destructive osseous lesion identified.
IMPRESSION: No acute osseous abnormality.

## 2016-12-28 IMAGING — CR DG FOOT COMPLETE 3+V*R*
3 series · 3 of 3 positions shown · non-contrast
Comparison: 04/15/2013

CLINICAL DATA: Right foot pain and swelling for 1 month, infection

EXAM:
RIGHT FOOT COMPLETE - 3+ VIEW

[AP]
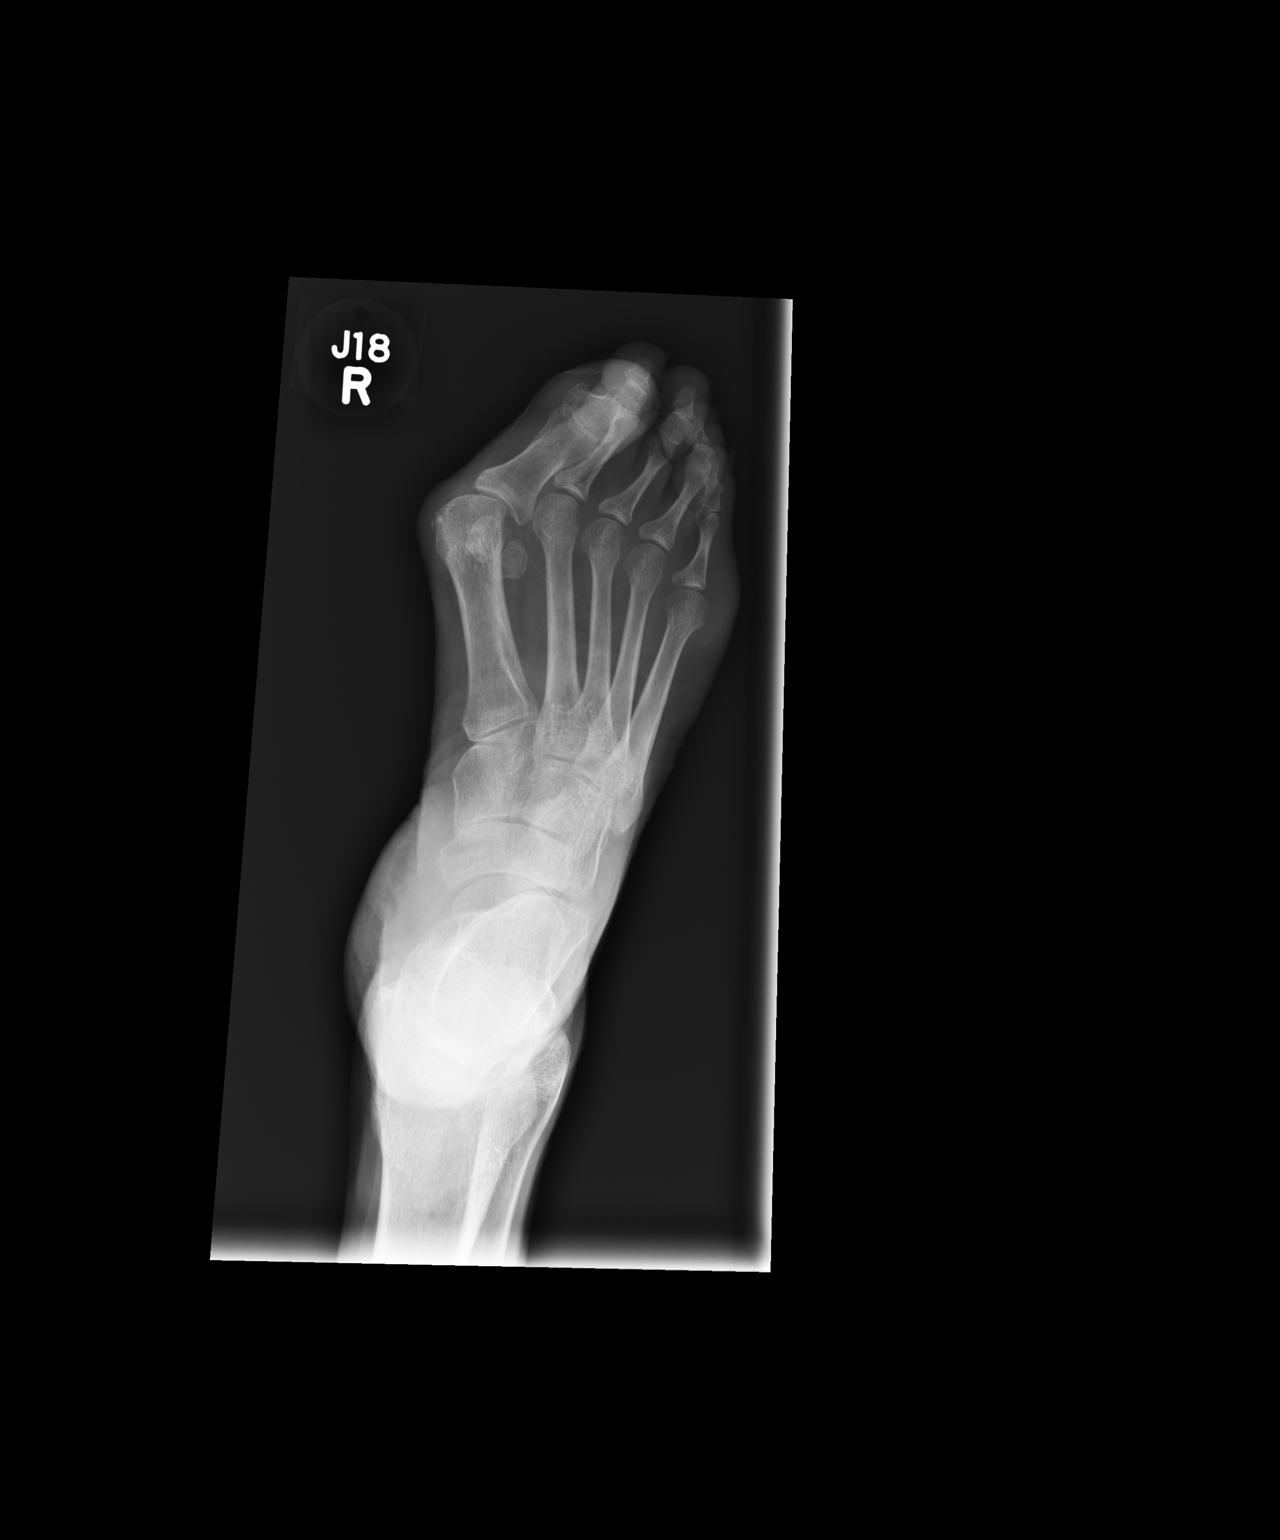

[ap obl int rot]
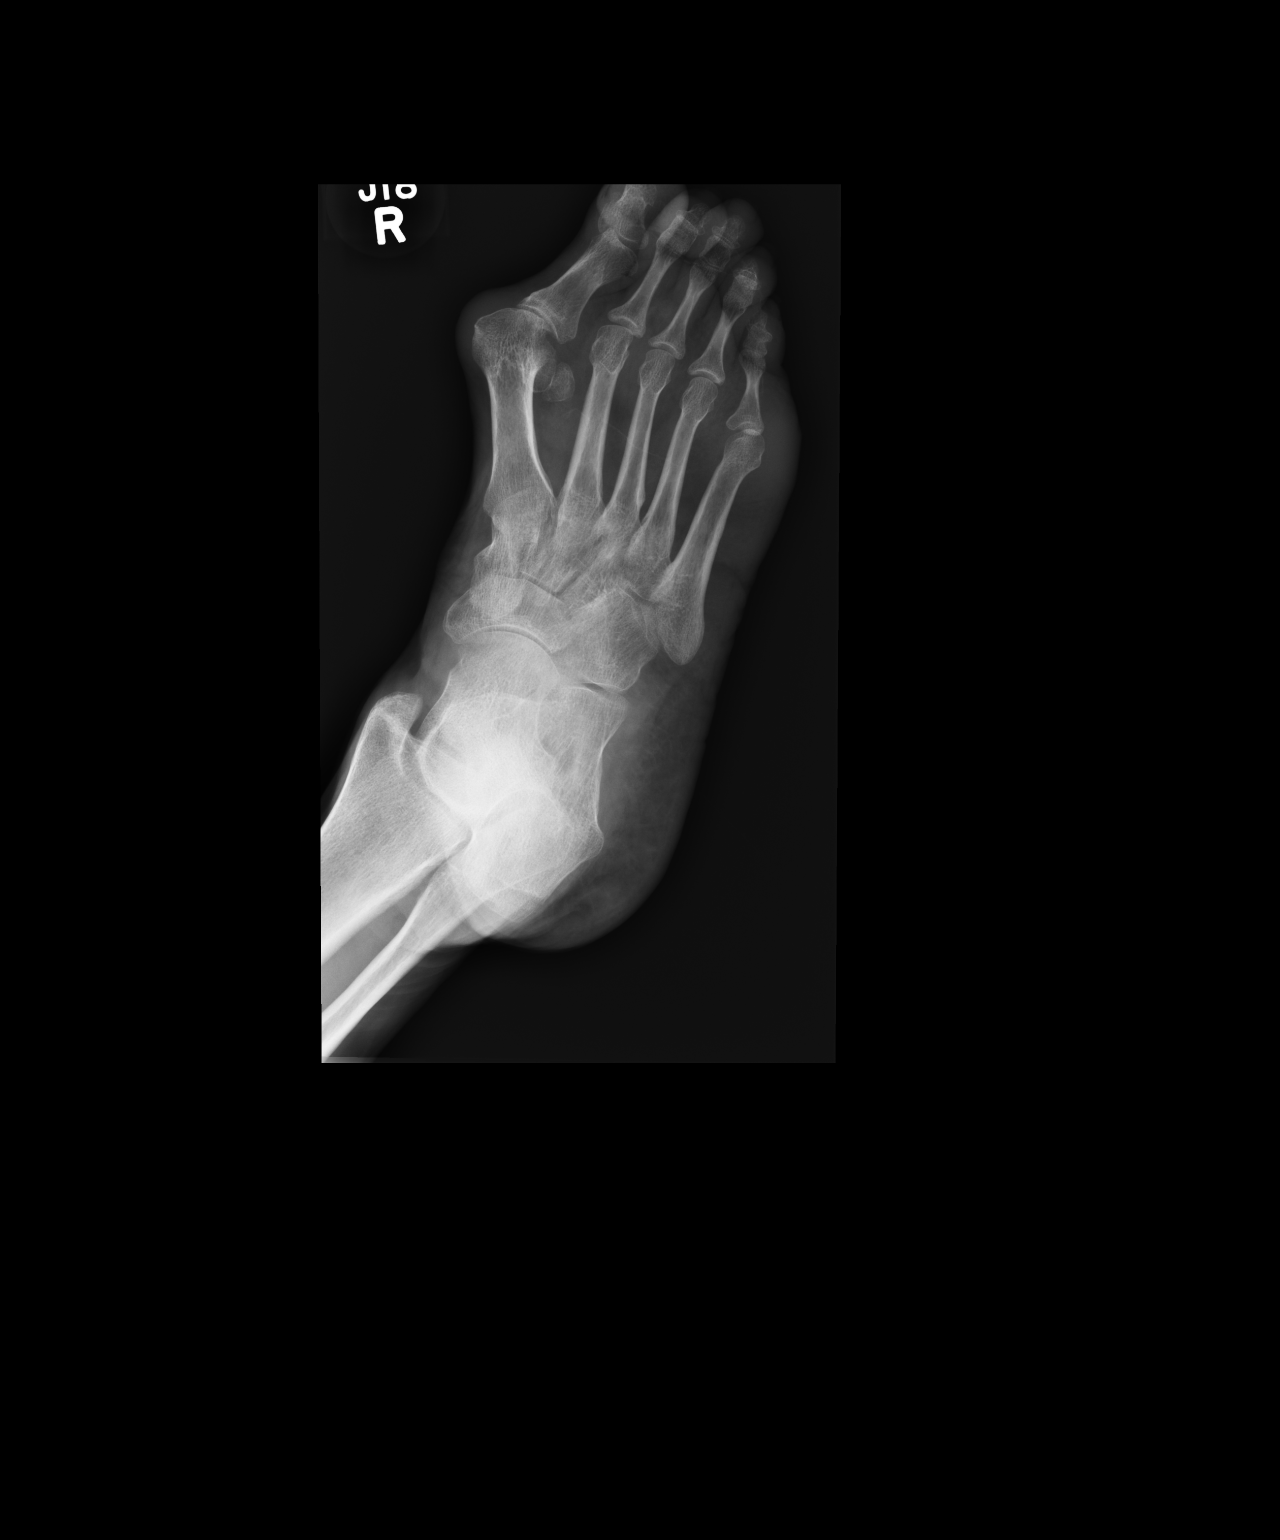

[lateral]
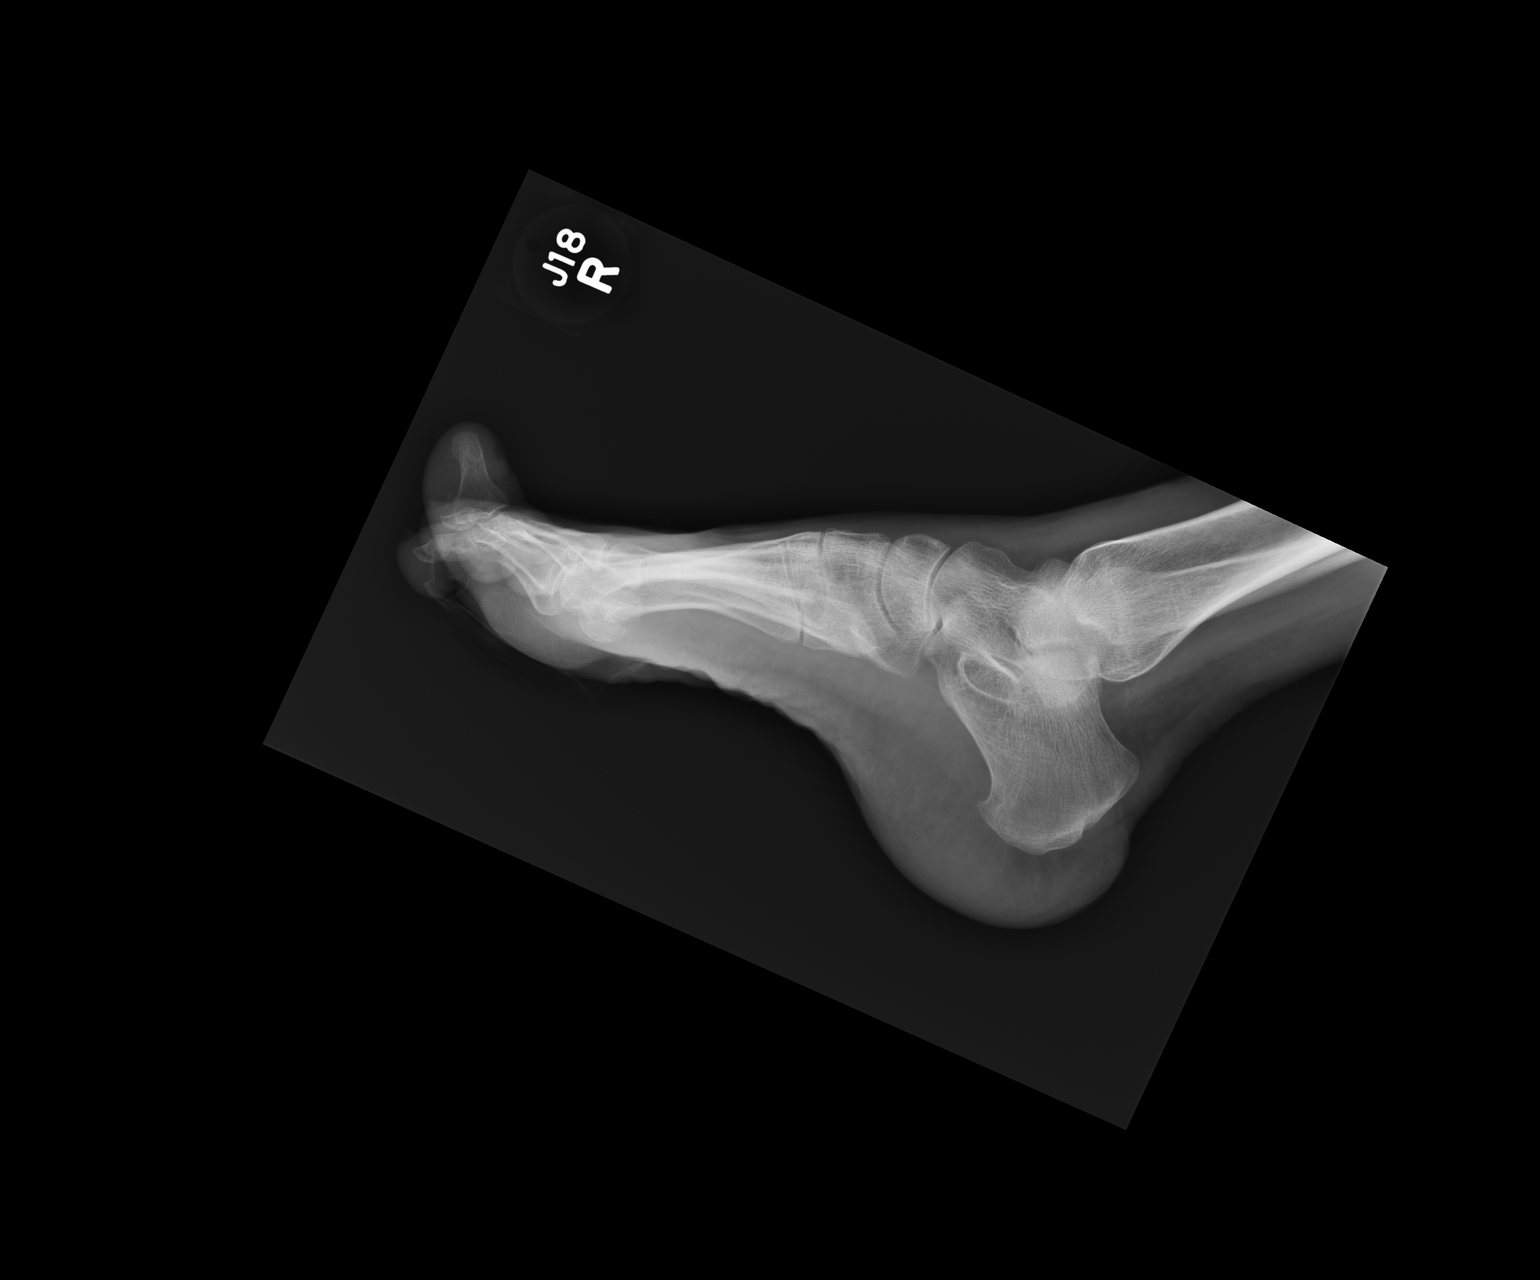

[3 of 3 positions shown; findings below may reference images not displayed]

FINDINGS: Marked hallux valgus deformity is noted. Swelling of the soft tissue
is identified at the level of the fifth metatarsal-phalangeal joint.
No radiopaque foreign body. No osseous destruction or fracture.
IMPRESSION: No osseous destruction or fracture. Mild soft tissue swelling is
noted adjacent to the fifth metatarsophalangeal joint.

## 2016-12-28 MED ORDER — CHLORHEXIDINE GLUCONATE CLOTH 2 % EX PADS
6.0000 | MEDICATED_PAD | Freq: Every day | CUTANEOUS | Status: DC
  Administered 2016-12-28: 6 via TOPICAL

## 2016-12-28 MED ORDER — MUPIROCIN 2 % EX OINT
1.0000 "application " | TOPICAL_OINTMENT | Freq: Two times a day (BID) | CUTANEOUS | Status: DC
  Administered 2016-12-28: 1 via NASAL
  Filled 2016-12-28: qty 22

## 2016-12-28 MED ORDER — OXYCODONE HCL 10 MG PO TABS: 10.0000 mg | ORAL_TABLET | ORAL | 0 refills | Status: DC | PRN

## 2016-12-28 MED ORDER — AMLODIPINE BESYLATE 10 MG PO TABS: 10.0000 mg | ORAL_TABLET | Freq: Every day | ORAL | 1 refills | Status: DC

## 2016-12-28 MED ORDER — FERROUS SULFATE 325 (65 FE) MG PO TABS: 325.0000 mg | ORAL_TABLET | Freq: Two times a day (BID) | ORAL | 3 refills | Status: DC

## 2016-12-28 NOTE — Progress Notes (Signed)
   Subjective: [name] was seen and evaluated today at bedside. Pain continues to improve. Discussed with patient disposition however reports she is unable and unwilling to give her Medicaid check to the skilled nursing facility as she has to pay last months in this month's rent at her own home.  Objective:  Vital signs in last 24 hours: Vitals:   12/27/16 1403 12/27/16 2122 12/28/16 0700 12/28/16 1300  BP: (!) 165/84 (!) 180/92 (!) 154/80 (!) 143/65  Pulse: 91 88 78 94  Resp: 18 18 18    Temp: 98.5 F (36.9 C) 98.8 F (37.1 C) 98.3 F (36.8 C) 98.8 F (37.1 C)  TempSrc: Oral Oral Oral Oral  SpO2: 100% 98% 99% 94%  Weight:      Height:       General: Cachectic and chronically-ill appearing african [place] female. Sleeping comfortably without distress. HENT: [place]/AT. BL ear helices absent. Cardiovascular: Regular rate and rhythm. No murmur or rub appreciated. Pulmonary: CTA bl. Breathing unlabored.  Abdomen: Soft, non-tender. +bowel sounds.  Skin: 3 small pressure ulcers on buttocks. Stage 2. Pressure ulcer on left elbow.  Psych: Mood depressed but improved and affect was mood congruent. Responds to questions appropriately. Appears optimistic. Appreciative of care.   Assessment/Plan: 56 y/o F with hx of cocaine abuse disorder, cocaine-associated (levamisole) vasculitis and chronic lower extremity wounds secondary to the above who presents with worsening pain of her chronic wounds. Pain is improved since admission with PO Oxy IR and MRI negative for osteomyelitis. Seen by plastics who note patient is not ready for amputation at this time. 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 Social work has been of great help in terms of disposition for this patient however patient refuses to defer her Medicaid check towards SNF due to financial concerns at home. Patient requests discharge with home health RN. This will be ordered for patient upon discharge to assist her with the complexity of her wound care. She is again been counseled on the importance of absolute cocaine abstinence, and she is hopeful she will be able to resist upon discharge. -Oxy 70m IR Q4H PRN -She will require outpatient follow-up in the internal medicine clinic  Hypertension: On Lisinopril 447mat home and was started on Amlodipine 5 mg during admission. Still continues to remain hypertensive. Will increase amlodipine to 10 mg daily and keep lisinopril 40 mg.  Normocytic Anemia: Anemia studies indicate iron deficiency and was started on PO iron replacement during admission. This will be continued upon d/c.   Dispo: Anticipated discharge today.   Ishi Danser, DO 12/28/2016, 1:48 PM Pager: [phone]

## 2016-12-28 NOTE — Clinical Social Work Note (Signed)
CSW met with pt at bedside to address consult for SNF. CSW clarified expectations for Medicaid when placed in SNF. Pt states she needs to pay rent at her boarding house and "cannot give check" to SNF. Pt stated she would check with her brother to see if he would be willing to pay rent.   Per RNCM, pt decided to go home with home health. RNCM following for home health and d/c needs. CSW signing off as no further SW interventions needed. Please reconsult if new SW needs arise.   [name], [place], [place] Work [phone]

## 2016-12-28 NOTE — Progress Notes (Signed)
Orthopedic Tech Progress Note Patient Details:  [name] [birth year] [id number]  Ortho Devices Type of Ortho Device: Louretta Parma boot Ortho Device/Splint Location: LLE Ortho Device/Splint Interventions: Ordered, Application   Braulio Bosch 12/28/2016, 3:33 PM

## 2016-12-28 NOTE — Care Management Note (Signed)
Case Management Note  Patient Details  Name: [name] MRN: [id number] Date of Birth: [birth year]  Subjective/Objective:                    Action/Plan:  Spoke with patient regarding SNF vs home health . Patient states she has decided to go home with home health [facility] , confirmed with Gracelyn with [facility] they can provide [facility].   Confirmed face sheet information with patient. Patient unsure at present if she will need ambulance transportation home . If she does she has a key and has already spoke to someone at home who is aware discharge is today and they will be home all day to let her in.   Will leave ambulance transport paperwork in shadow chart in case needed.  Paged MD for [facility] orders and face to face. Expected Discharge Date:                  Expected Discharge Plan:  [place]  In-House Referral:     Discharge planning Services  CM Consult  Post Acute Care Choice:  Home Health Choice offered to:  Patient  DME Arranged:    DME Agency:     HH Arranged:  RN [place] Agency:  [place]  Status of Service:  In process, will continue to follow  If discussed at Long Length of Stay Meetings, dates discussed:    Additional Comments:  Marilu Favre, RN 12/28/2016, 12:08 PM

## 2016-12-28 NOTE — Progress Notes (Signed)
qPhysical Therapy Treatment Patient Details Name: [name] MRN: [id number] DOB: 15-Mar-1961 Today's Date: 12/28/2016    History of Present Illness Pt is 56 y/o female admitted secondary to cocaine induced vascular disorders and wounds on B feet. PMH includes vasculitis, RA, migraines, cocaine abuse, depression, and HTN.     PT Comments    Pt getting ready to go home on entry. Pt asked how she was going to get around at home and she stated that she would use her w/c or her RW. Pt asked to demonstrate ambulation with RW. Pt supervision with bed mobility and min guard for sit<>stand with RW and ambulation of 3 feet. After 3 feet ambulation pt R foot began to bleed through bandaging. Pt returned to bed and began crying. Nursing notified and came in to change dressing. PT and nursing agreed that some sort of boot to normalize pressure across foot would be beneficial. Nursing to consult with physician. Pt requires skilled PT in acute setting to work with DME for mobility to be safe in her environment.      Follow Up Recommendations  SNF;Supervision/Assistance - 24 hour     Equipment Recommendations  3in1 (PT)    Recommendations for Other Services       Precautions / Restrictions Precautions Precautions: None Restrictions Weight Bearing Restrictions: No    Mobility  Bed Mobility Overal bed mobility: Needs Assistance Bed Mobility: Supine to Sit     Supine to sit: Supervision     General bed mobility comments: able to come to EoB with assist from bed rail   Transfers Overall transfer level: Needs assistance Equipment used: Rolling walker (2 wheeled) Transfers: Sit to/from Stand Sit to Stand: Min guard         General transfer comment: min Assist offered under arms pt declined help and came to standing under own power  Ambulation/Gait Ambulation/Gait assistance: Min guard Ambulation Distance (Feet): 3 Feet Assistive device: Rolling walker (2 wheeled) Gait  Pattern/deviations: Step-through pattern;Antalgic;Decreased step length - right;Decreased step length - left     General Gait Details: pt only able to ambulate 3 feet before L foot began bleeding through bandanges pt returned to bed an began crying in pain.     Balance Overall balance assessment: Needs assistance Sitting-balance support: Feet unsupported;Bilateral upper extremity supported Sitting balance-Leahy Scale: Poor Sitting balance - Comments: Required use of BUE during sitting balance secondary to increased pain in BLE with movement.    Standing balance support: Bilateral upper extremity supported Standing balance-Leahy Scale: Poor Standing balance comment: UE on RW                            Cognition Arousal/Alertness: Awake/alert Behavior During Therapy:  (tearful ) Overall Cognitive Status: No family/caregiver present to determine baseline cognitive functioning (Likely baseline)                                           General Comments General comments (skin integrity, edema, etc.): Pt R foot began to bleed through bandaging with gait, nursing notified and came to replace bandaging. Pt asked what she was going to do to be able to transfer to her w/c at home pt didn't know. In consulting with nursing agreed to have MD order boots to protect feet with even limited gait.      Pertinent  Vitals/Pain Pain Assessment: Faces Faces Pain Scale: Hurts worst Pain Location: B feet and legs  Pain Descriptors / Indicators: Crying;Sharp;Tender Pain Intervention(s): Limited activity within patient's tolerance;Patient requesting pain meds-RN notified;Monitored during session  VSS           PT Goals (current goals can now be found in the care plan section) Acute Rehab PT Goals Patient Stated Goal: Unable to state goal  PT Goal Formulation: With patient Time For Goal Achievement: 01/07/17 Potential to Achieve Goals: Fair Progress towards PT goals: Not  progressing toward goals - comment    Frequency    Min 3X/week      PT Plan Discharge plan needs to be updated (pt would be bettter served at [facility] but refuses going home)    Co-evaluation             End of Session   Activity Tolerance: Patient limited by pain Patient left: in bed;with call bell/phone within reach;with nursing/sitter in room Nurse Communication: Mobility status PT Visit Diagnosis: Other abnormalities of gait and mobility (R26.89);Pain Pain - Right/Left:  (Bilateral) Pain - part of body: Leg;Ankle and joints of foot     Time: 1430-1455 PT Time Calculation (min) (ACUTE ONLY): 25 min  Charges:  $Gait Training: 8-22 mins $Therapeutic Activity: 8-22 mins                    G Codes:       Christifer Chapdelaine B. Migdalia Dk PT, DPT Acute Rehabilitation  [phone] Pager [phone]     [place] 12/28/2016, 3:29 PM

## 2016-12-28 NOTE — Progress Notes (Signed)
[name] to be D/C'd  per MD order. Discussed with the patient and all questions fully answered.  VSS, Skin clean, dry and intact without evidence of skin break down, no evidence of skin tears noted.  IV catheter discontinued intact. Site without signs and symptoms of complications. Dressing and pressure applied.  An After Visit Summary was printed and given to the patient. Patient received prescription.  D/c education completed with patient/family including follow up instructions, medication list, d/c activities limitations if indicated, with other d/c instructions as indicated by MD - patient able to verbalize understanding, all questions fully answered.   Patient instructed to return to ED, call 911, or call MD for any changes in condition.   Patient to be escorted via [place], and D/C home via private auto.

## 2016-12-29 ENCOUNTER — Telehealth: Payer: Self-pay | Admitting: Internal Medicine

## 2016-12-29 NOTE — Telephone Encounter (Signed)
Called again, lm for rtc

## 2016-12-29 NOTE — Telephone Encounter (Signed)
Please call patient back as she states she was just discharge and states she has white blisters popping up on her skin.

## 2016-12-29 NOTE — Telephone Encounter (Signed)
Called pt, no answer, lm for rtc

## 2016-12-29 NOTE — Telephone Encounter (Signed)
Called, no answer, lm for rtc

## 2016-12-30 NOTE — Telephone Encounter (Signed)
Called pt back again, she says she has big white blisters all over since hosp disch, she cannot tell me more, she will try to get a ride Dr Gay Filler 1515 thurs 3/29

## 2016-12-31 ENCOUNTER — Encounter: Payer: Self-pay | Admitting: Internal Medicine

## 2017-01-04 ENCOUNTER — Telehealth: Payer: Self-pay | Admitting: *Deleted

## 2017-01-04 NOTE — Discharge Summary (Signed)
Name: [name] MRN: [id number] DOB: 1961-07-21 56 y.o. PCP: Zada Finders, MD  Date of Admission: 12/22/2016  2:50 PM Date of Discharge: 12/28/2016 Attending Physician: Dr. Rogene [place]  Discharge Diagnosis: 1. Cocaine-induced vascular disorder 2. Chronic non-healing wounds Principal Problem:   Cocaine-induced vascular disorder (HCC) Active Problems:   Cocaine use disorder, severe, dependence (HCC)   Wound infection   Ulcer with gangrene, with necrosis of muscle (HCC)   Cognitive impairment   Wound healing, delayed   Discharge Medications: Allergies as of 12/28/2016      Reactions   Acetaminophen Swelling, Other (See Comments)   Reaction:  Eyelid swelling   Other Other (See Comments)   Pt states she is allergic to a blood pressure medication, but does not know the reaction or medication      Medication List    TAKE these medications   amLODipine 10 MG tablet Commonly known as:  NORVASC Take 1 tablet (10 mg total) by mouth daily.   collagenase ointment Commonly known as:  SANTYL Apply topically daily.   feeding supplement Liqd Take 1 Container by mouth 3 (three) times daily between meals.   ferrous sulfate 325 (65 FE) MG tablet Take 1 tablet (325 mg total) by mouth 2 (two) times daily with a meal.   ibuprofen 600 MG tablet Commonly known as:  ADVIL,MOTRIN Take 1 tablet (600 mg total) by mouth every 8 (eight) hours as needed for mild pain or moderate pain.   lisinopril 40 MG tablet Commonly known as:  PRINIVIL,ZESTRIL Take 1 tablet (40 mg total) by mouth daily.   multivitamins with iron Tabs tablet Take 1 tablet by mouth daily.   Oxycodone HCl 10 MG Tabs Take 1 tablet (10 mg total) by mouth every 4 (four) hours as needed for moderate pain or severe pain. What changed:  when to take this  reasons to take this   senna 8.6 MG Tabs tablet Commonly known as:  SENOKOT Take 1 tablet (8.6 mg total) by mouth daily as needed for mild constipation.        Disposition and follow-up:   [name] was discharged from [facility] in stable condition.  At the hospital follow up visit please address:  1.  Cocaine use: Please continue to emphasize the importance of cocaine abstinence. Unfortunately, surgeons will not operate due to her ongoing cocaine use and subsequent vasculitis which would render her very difficult to heal.  Chronic wounds: NO osteomyelitis demonstrated on MR. Wounds are often mistaken as appearing infected, please use clinical judgement to assess this including fever, leukocytosis, etc. She was discharged home with [facility] RN to assist her with wound care.  2.  Labs / imaging needed at time of follow-up: None.  3.  Pending labs/ test needing follow-up: None.  Follow-up Appointments:   Hospital Course by problem list: Principal Problem:   Cocaine-induced vascular disorder ([place]) Active Problems:   Cocaine use disorder, severe, dependence ([place])   Wound infection   Ulcer with gangrene, with necrosis of muscle (HCC)   Cognitive impairment   Wound healing, delayed   1. 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 [name] continues to use cocaine despite many admissions for the same. The large wounds did not clinically appear infected and MR negative for osteomyelitis. She was seen by  plastics who will not operate. She has been seen by general surgery several times in the past who will also not operate given her ongoing cocaine use and poor healing given her vasculitis. PT/OT recommended SNF placement and SW was working on placement however pt refused to give up her Medicaid check and wanted to be discharged home with PT/OT. She was counseled extensively during her hospitalization re: the absolute need to quit all cocaine  products. She seemed motivated to quit.   Discharge Vitals:   BP (!) 143/65 (BP Location: Right Arm)   Pulse 94   Temp 98.8 F (37.1 C) (Oral)   Resp 18   Ht 5' 1.5" (1.562 m)   Wt 88 lb 2.9 oz (40 kg)   SpO2 94%   BMI 16.39 kg/m   Pertinent Labs, Studies, and Procedures:  BL MR tib/fib without evidence for osteomyelitis. Did show significant muscle edema and tendon attenuation  Discharge Instructions: [name], you were admitted for worsening pain of your chronic lower extremity wounds. We cant emphasize enough the importance of COMPLETE COCAINE CESSATION as this is the treatment for your condition! If you continue to use cocaine, your lower extremity wounds will worsen and unfortunately surgeons will not operate if you are still using cocaine as you will not heal.  I have sent you home with home health nursing to help you with your wound care.   SignedEinar Gip, DO 01/04/2017, 12:52 PM   Pager: [phone]

## 2017-01-04 NOTE — Telephone Encounter (Signed)
Returning pt's call - she had called earlier needing dressing supplies for her wounds. States she was able to change dressing today; out of supplies - she called  [facility] but stated they were unable to help and to call the office. Asked about HHN which was ordered upon discharge - stated they have finished. Pt did not show at [facility]U appt. She will come tomorrow for f/u  - appt 0915 AM ACC.

## 2017-01-05 ENCOUNTER — Ambulatory Visit: Payer: Self-pay

## 2017-01-06 ENCOUNTER — Ambulatory Visit (INDEPENDENT_AMBULATORY_CARE_PROVIDER_SITE_OTHER): Payer: Medicaid Other | Admitting: Internal Medicine

## 2017-01-06 ENCOUNTER — Encounter: Payer: Self-pay | Admitting: Internal Medicine

## 2017-01-06 VITALS — BP 163/86 | HR 110 | Temp 97.4°F | Ht 61.2 in | Wt 85.1 lb

## 2017-01-06 DIAGNOSIS — L97323 Non-pressure chronic ulcer of left ankle with necrosis of muscle: Secondary | ICD-10-CM | POA: Diagnosis not present

## 2017-01-06 DIAGNOSIS — I10 Essential (primary) hypertension: Secondary | ICD-10-CM | POA: Diagnosis not present

## 2017-01-06 DIAGNOSIS — Z5189 Encounter for other specified aftercare: Secondary | ICD-10-CM

## 2017-01-06 DIAGNOSIS — L958 Other vasculitis limited to the skin: Secondary | ICD-10-CM | POA: Diagnosis not present

## 2017-01-06 DIAGNOSIS — E46 Unspecified protein-calorie malnutrition: Secondary | ICD-10-CM

## 2017-01-06 DIAGNOSIS — F14288 Cocaine dependence with other cocaine-induced disorder: Secondary | ICD-10-CM | POA: Diagnosis not present

## 2017-01-06 DIAGNOSIS — F14988 Cocaine use, unspecified with other cocaine-induced disorder: Secondary | ICD-10-CM

## 2017-01-06 DIAGNOSIS — I999 Unspecified disorder of circulatory system: Secondary | ICD-10-CM

## 2017-01-06 DIAGNOSIS — F1424 Cocaine dependence with cocaine-induced mood disorder: Secondary | ICD-10-CM

## 2017-01-06 DIAGNOSIS — R000 Tachycardia, unspecified: Secondary | ICD-10-CM | POA: Diagnosis not present

## 2017-01-06 DIAGNOSIS — L98493 Non-pressure chronic ulcer of skin of other sites with necrosis of muscle: Principal | ICD-10-CM

## 2017-01-06 DIAGNOSIS — F17210 Nicotine dependence, cigarettes, uncomplicated: Secondary | ICD-10-CM | POA: Diagnosis not present

## 2017-01-06 DIAGNOSIS — L97213 Non-pressure chronic ulcer of right calf with necrosis of muscle: Secondary | ICD-10-CM | POA: Diagnosis not present

## 2017-01-06 DIAGNOSIS — L97223 Non-pressure chronic ulcer of left calf with necrosis of muscle: Secondary | ICD-10-CM

## 2017-01-06 DIAGNOSIS — I96 Gangrene, not elsewhere classified: Secondary | ICD-10-CM

## 2017-01-06 NOTE — Progress Notes (Signed)
   CC: Hospital follow up for bilateral leg wounds  HPI:  [name] is a 56 y.o. woman here today for a one week follow up after hospital discharge for bilateral vasculitic leg wounds without evidence of superimposed infection. She is here today with an aquaintance from her boarding house who is also informally for some home aide tasks.   See problem based assessment and plan below for additional details  Past Medical History:  Diagnosis Date  . CAP (community acquired pneumonia) 03/2014 X 2  . Cocaine abuse    ongoing with resultant vaculitis.  . Depression   . Headache    "weekly" (07/29/2016)  . Hypertension   . Inflammatory arthritis   . Migraines    "probably 5-6/yr" (07/29/2016)  . Normocytic anemia    BL Hgb 9.8-12. Last anemia panel 04/2010 - showing Fe 19, ferritin 101.  Pt on monthly B12 injections  . Rheumatoid arthritis(714.0)    patient reported  . 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 Followed by Dr. Louanne Skye    Review of Systems:  Review of Systems  Constitutional: Negative for fever.  Respiratory: Negative for shortness of breath.   Cardiovascular: Negative for leg swelling.  Gastrointestinal: Negative for diarrhea.  Musculoskeletal: Negative for falls.       Leg pain  Psychiatric/Behavioral: Positive for depression.    Physical Exam: Physical Exam  Constitutional:  Chronically ill, malnourished woman  HENT:  Bilateral ear tip erosions  Cardiovascular: Normal rate and regular rhythm.   Pulmonary/Chest: Effort normal and breath sounds normal.  Musculoskeletal:  Bilateral leg wounds on inferior, lateral aspect of left ankle, superior medial aspect of left calf Some fascial layer exposed on medial left ankle  Right leg wound red, clean based extending about 15cm up back of right calf  Wounds appear red, moist, and clean based without obvious purulence or drainage  Lymphadenopathy:    She  has no cervical adenopathy.  Psychiatric:  Tearful with any manipulation of her wounds. Withdrawn when discussing her treatment plan    Vitals:   01/06/17 1542  BP: (!) 163/86  Pulse: (!) 110  Temp: 97.4 F (36.3 C)  TempSrc: Oral  SpO2: 100%  Weight: 85 lb 1.6 oz (38.6 kg)  Height: 5' 1.2" (1.554 m)     Assessment & Plan:   See Encounters Tab for problem based charting.  Patient seen with Dr. Dareen Piano

## 2017-01-06 NOTE — Patient Instructions (Signed)
It was a pleasure to see you today [name].  I am sad to hear you are having trouble getting your home supplies for wound care. Ideally we can get you better connected so this can be replaced more frequently to minimize the risk of infection.  It will be very important to follow up with the wound care clinic quickly for their recommendations but also so we can get you dressings more reliably. This follow up will be more valuable than seeing you in our clinic at present. We will be happy to see you however if needed due to trouble getting into their clinic or if you start to feel worse.  I can refer you for home health aide services through medicaid although I cannot guarantee a particular agency to be preferred over others at this time.

## 2017-01-07 ENCOUNTER — Telehealth: Payer: Self-pay

## 2017-01-08 NOTE — Assessment & Plan Note (Addendum)
Reports no additional use of cocaine since recent hospital discharge. She does continue to be somewhat withdrawn and tearful during our encounter which is unfortunately common due to her chronic pain and substance induced mood disorder/depression. I do think her overall malnutrition and poor self care will benefit from additional home supervision and assistance. Problem discussed regarding her wounds above.

## 2017-01-08 NOTE — Assessment & Plan Note (Signed)
HPI: [name] has chronic non-healing wounds on both legs including the right calf left calf and left lateral ankle associated with cocaine-induced vasculitis that has had a waxing and waning course due to variable compliance with home wound care and abstinence from cocaine. Overall her functional status has remained poor with some stepwise declines after hospitalizations or complications due to infected wounds.  She was recently hospitalized for these wounds due to increased debility due to pain and concern for possible wound infection which was worked up and fortunately appeared negative. She was recommended for SNIF placement at discharged with the hope she might get more benefit with physical therapy or better nutrition but was not agreeable to this option as she wants to maintain her independent finances and given back to her boarding home. She has been doing fairly well at home although is having trouble due to running out of dressings and supplies to continue her self wound care. Her mobility is overall pretty poor though she is able to travel with a walker for periods of time to limit the total weight on her calves and ankles.  She has been getting informal home health assistance through a resident of her boarding house was also expressed interest in becoming a personal care assistant/home health aid for her since she does have significant difficulties performing all of her ADLs and maintaining a hygienic home environment due to pain and decreased mobility.  A: Chronic nonhealing bilateral lower extremity wounds that appear to have healthy granulation tissue on bases though there is some exposed muscular and fascial layers. She would benefit from analgesics for her wound care and hygiene but unfortunately is not a good candidate for any narcotic prescription treatment due to history of substance use, mood disorder, and relatively unsecured home environment. I agree she could benefit with formal  home aide that could be subsidized by medicaid due to her low mobility/functional status and might also lead to attending more clinic and wound care appointments.  P: -Ambulatory referral to wound care clinic -Social work referral for arrangement of PCS to help home care and appointments -Reordered ibuprofen 876m as PRN analgesic -Wounds redressed today and limited supplies given for home change -Emphasized again the need for continued abstinence from cocaine use

## 2017-01-08 NOTE — Assessment & Plan Note (Signed)
HPI: She's been consistently hypertensive during most recent visits though is often confounded by active pain during her clinical encounters with this. However this is probably true most the time for her if she is mobile at all at home. Blood pressure in 237S systolic and Today with some tachycardia 110 bpm. She reports no recurrent recent substance use contributing. She was recently prescribed amlodipine as blood pressure medicine during her hospitalization a week ago but has not actually been taking this medicine by now. This should be no contraindication to discuss internal blocker since peripheral vasodilation would not be harmful in her case.  A: Continued moderately uncontrolled hypertension She has not actually changed treatment with the recommendations from hospitalization one week ago She is not having any clear symptoms though this would be hard to tell given her coexisting problems  P: Continue lisinopril once daily for hypertension I recommended her to take up amlodipine and try starting this medicine Recommend return to clinic at 2 weeks hopefully after starting treatment

## 2017-01-12 NOTE — Progress Notes (Signed)
Internal Medicine Clinic Attending  I saw and evaluated the patient.  I personally confirmed the key portions of the history and exam documented by Dr. Rice and I reviewed pertinent patient test results.  The assessment, diagnosis, and plan were formulated together and I agree with the documentation in the resident's note.  

## 2017-01-19 ENCOUNTER — Telehealth: Payer: Self-pay | Admitting: Internal Medicine

## 2017-01-19 NOTE — Telephone Encounter (Signed)
Calling to confirm appointment for 01/20/17 at 2:15 lmtcb

## 2017-01-20 ENCOUNTER — Ambulatory Visit (INDEPENDENT_AMBULATORY_CARE_PROVIDER_SITE_OTHER): Payer: Medicaid Other | Admitting: Pulmonary Disease

## 2017-01-20 VITALS — BP 161/130 | HR 107 | Temp 98.6°F

## 2017-01-20 DIAGNOSIS — L97913 Non-pressure chronic ulcer of unspecified part of right lower leg with necrosis of muscle: Secondary | ICD-10-CM

## 2017-01-20 DIAGNOSIS — F14288 Cocaine dependence with other cocaine-induced disorder: Secondary | ICD-10-CM | POA: Diagnosis not present

## 2017-01-20 DIAGNOSIS — L97923 Non-pressure chronic ulcer of unspecified part of left lower leg with necrosis of muscle: Secondary | ICD-10-CM

## 2017-01-20 DIAGNOSIS — L958 Other vasculitis limited to the skin: Secondary | ICD-10-CM | POA: Diagnosis not present

## 2017-01-20 DIAGNOSIS — L97903 Non-pressure chronic ulcer of unspecified part of unspecified lower leg with necrosis of muscle: Secondary | ICD-10-CM

## 2017-01-20 MED ORDER — OXYCODONE HCL 10 MG PO TABS: 10.0000 mg | ORAL_TABLET | Freq: Four times a day (QID) | ORAL | 0 refills | Status: DC | PRN

## 2017-01-20 MED ORDER — IBUPROFEN 600 MG PO TABS: 600.0000 mg | ORAL_TABLET | Freq: Three times a day (TID) | ORAL | 11 refills | Status: DC | PRN

## 2017-01-20 MED ORDER — KETOROLAC TROMETHAMINE 30 MG/ML IJ SOLN
60.0000 mg | Freq: Once | INTRAMUSCULAR | Status: AC
  Administered 2017-01-20: 60 mg via INTRAMUSCULAR

## 2017-01-20 NOTE — Patient Instructions (Signed)
Take the ibuprofen three times a day Take oxycodone up to 4 times a day only as needed for severe pain Follow up here on Friday or Monday Please go to wound care clinic tomorrow

## 2017-01-20 NOTE — Progress Notes (Signed)
   CC: BLE wounds  HPI:  [name] is a 55 y.o. woman with history as noted below here for follow up of her chronic bilateral LE wounds from levamisole vasculitis.   She ran out of her oxycodone a couple of days ago and has been having a lot of pain. Also ran out of her wound care supplies. Denies cocaine use for the past couple of weeks. During questioning, she is very tearful and somewhat hysterical to the point of having a difficult time understanding her. She has an appointment tomorrow with the wound care clinic.    Past Medical History:  Diagnosis Date  . CAP (community acquired pneumonia) 03/2014 X 2  . Cocaine abuse    ongoing with resultant vaculitis.  . Depression   . Headache    "weekly" (07/29/2016)  . Hypertension   . Inflammatory arthritis   . Migraines    "probably 5-6/yr" (07/29/2016)  . Normocytic anemia    BL Hgb 9.8-12. Last anemia panel 04/2010 - showing Fe 19, ferritin 101.  Pt on monthly B12 injections  . Rheumatoid arthritis(714.0)    patient reported  . 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 Followed by Dr. Louanne Skye    Review of Systems:   No dyspnea No nausea/vomiting  Physical Exam:  Vitals:   01/20/17 1425  BP: (!) 161/130  Pulse: (!) 107  Temp: 98.6 F (37 C)  TempSrc: Oral  SpO2: 100%   General Apperance: Lying on side on exam table and starts to cry as soon as I attempt to engage her in conversation HEENT: Normocephalic, atraumatic, anicteric sclera Neck: Supple, trachea midline Lungs: Clear to auscultation bilaterally. No wheezes, rhonchi or rales. Breathing comfortably Heart: Tachycardic rate and regular rhythm Abdomen: Soft, nontender, nondistended, no rebound/guarding Extremities: Warm and well perfused, no edema Skin: BLE dressings on with serous strikethrough, no odor Neurologic: Alert and interactive. No gross deficits.  Assessment & Plan:   See Encounters Tab for  problem based charting.  Patient discussed with Dr. Lynnae January

## 2017-01-21 ENCOUNTER — Encounter (HOSPITAL_BASED_OUTPATIENT_CLINIC_OR_DEPARTMENT_OTHER): Payer: Medicaid Other | Attending: Internal Medicine

## 2017-01-21 NOTE — Progress Notes (Signed)
Internal Medicine Clinic Attending  Case discussed with Dr. Randell Patient at the time of the visit.  We reviewed the resident's history and exam and pertinent patient test results.  I agree with the assessment, diagnosis, and plan of care documented in the resident's note. This is a challenging situation as the patient has clear reasons for having pain. However, she has an addiction to cocaine, has a history of not showing up for her appointments, doesn't always follow recommendations, and has poor healthcare literacy inability to advocate for her health. In January of this year she got 30 oxycodone 10 mg. In February she got 12 oxycodone 10 mg pills. In March she got 42 oxycodone 10 mg pills. All prescriptions have come from the [facility] but from different physicians and 2 pharmacies. This is a haphazard approach to her care but currently is the only available option to [place]. She has a very poor prognosis regardless of our pain control.

## 2017-01-21 NOTE — Assessment & Plan Note (Addendum)
Assessment: She is in a lot of pain today. She was given 8m IM toradol after which she was calm when no one is engaging her in conversation.  Plan: Difficulty with compliance. I discussed with her the importance of going to the wound care clinic and complying with their recommendations. I will provide her with a short supply of oxycodone 169m#16 tablets. She is to follow up in our clinic on Friday or Monday. If she does not follow these recommendations or if she starts using cocaine again, we will not be continuing her prescriptions for oxycodone. I think as long as she complies and has frequent follow ups here, we can continue short supplies of oxycodone to make sure that her legs heal properly. Would get UDS at next several visits to make sure she is actually taking the oxycodone and to make sure the cocaine clears from her system.

## 2017-01-25 ENCOUNTER — Other Ambulatory Visit: Payer: Self-pay | Admitting: Internal Medicine

## 2017-01-25 ENCOUNTER — Ambulatory Visit: Payer: Self-pay

## 2017-01-25 NOTE — Telephone Encounter (Signed)
Pt was quite upset, states that is not what she told "you", stated she told "you" that she took her most recent script from dr Randell Patient to [facility] and they would not fill it because their computer system w/ medicaid is down, [facility] and [facility] because she is locked in, called [facility], the pharmacist stated she goes to multiple providers and medicaid has found out and she is locked in to one provider, I explained all recent providers are with [facility] the pharmacist then said she should have only gone to one pharmacy, I explained [facility] cannot use the medicaid system at present, thanked them and called [facility] [address], explained to pharmacist the dilemma and he has agreed to fill the script with my explanation. I then cautioned pt and caregiver that pt has a scheduled appt 4/25 and cannot miss it and she should not miss wound care.she stated she will be here and didn't have transportation to wound care or here, I then ask how she would get to pharmacy and she didn't answer just hung up.

## 2017-01-25 NOTE — Telephone Encounter (Signed)
Denied. Pt did not keep wound care appt and no showed [facility] appt today.

## 2017-01-25 NOTE — Telephone Encounter (Signed)
Patient requesting refill  Oxycodone HCl 10 MG TABS

## 2017-01-26 ENCOUNTER — Telehealth: Payer: Self-pay | Admitting: Internal Medicine

## 2017-01-26 NOTE — Telephone Encounter (Signed)
APT. REMINDER CALL, LMTCB °

## 2017-01-26 NOTE — Telephone Encounter (Signed)
Noted. Thanks. Has appt tomorrow.

## 2017-01-27 ENCOUNTER — Ambulatory Visit: Payer: Self-pay

## 2017-02-01 ENCOUNTER — Ambulatory Visit (INDEPENDENT_AMBULATORY_CARE_PROVIDER_SITE_OTHER): Payer: Medicaid Other | Admitting: Internal Medicine

## 2017-02-01 ENCOUNTER — Encounter: Payer: Self-pay | Admitting: Internal Medicine

## 2017-02-01 VITALS — BP 181/98 | HR 104 | Temp 97.9°F | Ht 61.2 in | Wt 90.8 lb

## 2017-02-01 DIAGNOSIS — L97813 Non-pressure chronic ulcer of other part of right lower leg with necrosis of muscle: Secondary | ICD-10-CM | POA: Diagnosis not present

## 2017-02-01 DIAGNOSIS — L97823 Non-pressure chronic ulcer of other part of left lower leg with necrosis of muscle: Secondary | ICD-10-CM

## 2017-02-01 DIAGNOSIS — F17210 Nicotine dependence, cigarettes, uncomplicated: Secondary | ICD-10-CM | POA: Diagnosis not present

## 2017-02-01 DIAGNOSIS — I10 Essential (primary) hypertension: Secondary | ICD-10-CM | POA: Diagnosis present

## 2017-02-01 DIAGNOSIS — L97903 Non-pressure chronic ulcer of unspecified part of unspecified lower leg with necrosis of muscle: Secondary | ICD-10-CM

## 2017-02-01 DIAGNOSIS — F1420 Cocaine dependence, uncomplicated: Secondary | ICD-10-CM | POA: Diagnosis not present

## 2017-02-01 NOTE — Progress Notes (Signed)
   CC: HTN, chronic wounds  HPI: [name] is a 56 y.o. woman with PMH noted below here for HTN, chronic wounds.   Please see Problem List/A&P for the status of the patient's chronic medical problems   Past Medical History:  Diagnosis Date  . CAP (community acquired pneumonia) 03/2014 X 2  . Cocaine abuse    ongoing with resultant vaculitis.  . Depression   . Headache    "weekly" (07/29/2016)  . Hypertension   . Inflammatory arthritis   . Migraines    "probably 5-6/yr" (07/29/2016)  . Normocytic anemia    BL Hgb 9.8-12. Last anemia panel 04/2010 - showing Fe 19, ferritin 101.  Pt on monthly B12 injections  . Rheumatoid arthritis(714.0)    patient reported  . 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 Followed by Dr. Louanne Skye    Review of Systems: Denies fevers, chills,  Denies n/v/abd pain Has been off cocaine for 5 days Has missed wound care appt   Physical Exam: Vitals:   02/01/17 1435  BP: (!) 181/98  Pulse: (!) 104  Temp: 97.9 F (36.6 C)  TempSrc: Oral  SpO2: 100%  Weight: 90 lb 12.8 oz (41.2 kg)  Height: 5' 1.2" (1.554 m)    General: A&O, in NAD CV: RRR, normal s1, s2, no m/r/g,  Resp: equal and symmetric breath sounds, no wheezing or crackles  Abdomen: soft, nontender, nondistended, +BS  Bilateral leg wounds- seem to have granulation tissue over it.  Pictures of the wound have been uploaded to the chart      Assessment & Plan:   See encounters tab for problem based medical decision making. Patient discussed with Dr. Evette Doffing

## 2017-02-01 NOTE — Assessment & Plan Note (Signed)
Pt was hypertensive today at 181/98.  She has been intermittently using he lisinopril and has not taken the amlodipine which was sent earlier.  Plan -continue lisinopril -encouraged to start the amlodipine.

## 2017-02-01 NOTE — Progress Notes (Signed)
Pt given dismissed from practice letter in person at the end of her appt with Dr. Tiburcio Pea, and signed a copy of that letter to indicate she understood the need to find another PCP. Pt was reminded that she needed to keep her wound care appt on May 10, was given wound care supplies per her own regimen of using Telfa and ABD pads wrapped in Kerlex. Pt verbalized understanding she was being d/c for having so many "no show" appt in spite of being given her pain medicine with the stipulation she come for her appt and go to her referral appt. Pt stated she had difficulty with transportation and was reminded to call ahead with her next PCP so they know she cannot come and why rather than not calling and not coming as she has done repeatedly at [facility]. Pt had no questions and took a copy of the signed dismissal letter with her, along with the wound care supplies and the 5/10 appt reminder for the wound center. Susette Racer, Engineer, building services witnessed the discussion and letter signing. Yvonna Alanis, RN, 02/01/17- late chart at 6:26 P for 3: 45 P dicussion with pt.

## 2017-02-01 NOTE — Patient Instructions (Signed)
Thank you for your visit today  Please follow up with wound care. Please continue to stay off cocaine and do not smoke.

## 2017-02-01 NOTE — Assessment & Plan Note (Addendum)
Patient is here for follow up.  She missed her wound care appt last week, and she has been bandaging her wounds and dressing changed this morning. On exam, wounds have granulation tissue and do not see current signs of infection   Plan -follow up with wound care -Pt has been dismissed from the practice.  -Have explained the need to stay off cocaine and smoking

## 2017-02-02 NOTE — Progress Notes (Signed)
Internal Medicine Clinic Attending  I saw and evaluated the patient.  I personally confirmed the key portions of the history and exam documented by Dr. Saraiya and I reviewed pertinent patient test results.  The assessment, diagnosis, and plan were formulated together and I agree with the documentation in the resident's note.  

## 2017-02-03 ENCOUNTER — Ambulatory Visit: Payer: Self-pay

## 2017-02-05 DIAGNOSIS — E785 Hyperlipidemia, unspecified: Secondary | ICD-10-CM | POA: Diagnosis present

## 2017-02-05 DIAGNOSIS — E43 Unspecified severe protein-calorie malnutrition: Secondary | ICD-10-CM | POA: Diagnosis present

## 2017-02-05 DIAGNOSIS — F329 Major depressive disorder, single episode, unspecified: Secondary | ICD-10-CM | POA: Diagnosis present

## 2017-02-05 DIAGNOSIS — F17210 Nicotine dependence, cigarettes, uncomplicated: Secondary | ICD-10-CM | POA: Diagnosis present

## 2017-02-05 DIAGNOSIS — E860 Dehydration: Secondary | ICD-10-CM | POA: Diagnosis present

## 2017-02-05 DIAGNOSIS — Z79899 Other long term (current) drug therapy: Secondary | ICD-10-CM

## 2017-02-05 DIAGNOSIS — L03011 Cellulitis of right finger: Secondary | ICD-10-CM | POA: Diagnosis present

## 2017-02-05 DIAGNOSIS — I776 Arteritis, unspecified: Principal | ICD-10-CM | POA: Diagnosis present

## 2017-02-05 DIAGNOSIS — L02511 Cutaneous abscess of right hand: Secondary | ICD-10-CM | POA: Diagnosis present

## 2017-02-05 DIAGNOSIS — D509 Iron deficiency anemia, unspecified: Secondary | ICD-10-CM | POA: Diagnosis present

## 2017-02-05 DIAGNOSIS — Z681 Body mass index (BMI) 19 or less, adult: Secondary | ICD-10-CM

## 2017-02-05 DIAGNOSIS — Z886 Allergy status to analgesic agent status: Secondary | ICD-10-CM

## 2017-02-05 DIAGNOSIS — I10 Essential (primary) hypertension: Secondary | ICD-10-CM | POA: Diagnosis present

## 2017-02-05 DIAGNOSIS — M064 Inflammatory polyarthropathy: Secondary | ICD-10-CM | POA: Diagnosis present

## 2017-02-05 DIAGNOSIS — N179 Acute kidney failure, unspecified: Secondary | ICD-10-CM | POA: Diagnosis present

## 2017-02-05 DIAGNOSIS — F14188 Cocaine abuse with other cocaine-induced disorder: Secondary | ICD-10-CM | POA: Diagnosis present

## 2017-02-05 DIAGNOSIS — E876 Hypokalemia: Secondary | ICD-10-CM | POA: Diagnosis present

## 2017-02-06 ENCOUNTER — Encounter (HOSPITAL_COMMUNITY): Payer: Self-pay | Admitting: Emergency Medicine

## 2017-02-06 ENCOUNTER — Observation Stay (HOSPITAL_COMMUNITY): Payer: Medicaid Other

## 2017-02-06 ENCOUNTER — Inpatient Hospital Stay (HOSPITAL_COMMUNITY)
Admission: EM | Admit: 2017-02-06 | Discharge: 2017-02-08 | DRG: 545 | Disposition: A | Discharge status: Home / Self Care | Payer: Medicaid Other | Attending: Internal Medicine | Admitting: Internal Medicine

## 2017-02-06 ENCOUNTER — Emergency Department (HOSPITAL_COMMUNITY): Payer: Medicaid Other

## 2017-02-06 DIAGNOSIS — Z886 Allergy status to analgesic agent status: Secondary | ICD-10-CM | POA: Diagnosis not present

## 2017-02-06 DIAGNOSIS — F14988 Cocaine use, unspecified with other cocaine-induced disorder: Secondary | ICD-10-CM | POA: Diagnosis present

## 2017-02-06 DIAGNOSIS — D509 Iron deficiency anemia, unspecified: Secondary | ICD-10-CM | POA: Diagnosis not present

## 2017-02-06 DIAGNOSIS — Z811 Family history of alcohol abuse and dependence: Secondary | ICD-10-CM

## 2017-02-06 DIAGNOSIS — I999 Unspecified disorder of circulatory system: Secondary | ICD-10-CM

## 2017-02-06 DIAGNOSIS — F14288 Cocaine dependence with other cocaine-induced disorder: Secondary | ICD-10-CM | POA: Diagnosis not present

## 2017-02-06 DIAGNOSIS — I10 Essential (primary) hypertension: Secondary | ICD-10-CM | POA: Diagnosis present

## 2017-02-06 DIAGNOSIS — Z800 Family history of malignant neoplasm of digestive organs: Secondary | ICD-10-CM

## 2017-02-06 DIAGNOSIS — L958 Other vasculitis limited to the skin: Secondary | ICD-10-CM

## 2017-02-06 DIAGNOSIS — Z803 Family history of malignant neoplasm of breast: Secondary | ICD-10-CM

## 2017-02-06 DIAGNOSIS — N179 Acute kidney failure, unspecified: Secondary | ICD-10-CM | POA: Diagnosis not present

## 2017-02-06 DIAGNOSIS — E876 Hypokalemia: Secondary | ICD-10-CM | POA: Diagnosis not present

## 2017-02-06 DIAGNOSIS — L02511 Cutaneous abscess of right hand: Secondary | ICD-10-CM

## 2017-02-06 DIAGNOSIS — L089 Local infection of the skin and subcutaneous tissue, unspecified: Secondary | ICD-10-CM

## 2017-02-06 DIAGNOSIS — T148XXA Other injury of unspecified body region, initial encounter: Secondary | ICD-10-CM

## 2017-02-06 DIAGNOSIS — F17210 Nicotine dependence, cigarettes, uncomplicated: Secondary | ICD-10-CM

## 2017-02-06 DIAGNOSIS — E43 Unspecified severe protein-calorie malnutrition: Secondary | ICD-10-CM | POA: Diagnosis present

## 2017-02-06 DIAGNOSIS — M869 Osteomyelitis, unspecified: Secondary | ICD-10-CM

## 2017-02-06 LAB — CBC WITH DIFFERENTIAL/PLATELET
BASOS ABS: 0 10*3/uL (ref 0.0–0.1)
Basophils Relative: 0 %
Eosinophils Absolute: 0.1 10*3/uL (ref 0.0–0.7)
Eosinophils Relative: 1 %
HEMATOCRIT: 30.3 % — AB (ref 36.0–46.0)
Hemoglobin: 10.1 g/dL — ABNORMAL LOW (ref 12.0–15.0)
LYMPHS ABS: 2.1 10*3/uL (ref 0.7–4.0)
LYMPHS PCT: 26 %
MCH: 26 pg (ref 26.0–34.0)
MCHC: 33.3 g/dL (ref 30.0–36.0)
MCV: 77.9 fL — AB (ref 78.0–100.0)
MONO ABS: 0.5 10*3/uL (ref 0.1–1.0)
MONOS PCT: 6 %
NEUTROS ABS: 5.3 10*3/uL (ref 1.7–7.7)
Neutrophils Relative %: 67 %
PLATELETS: 334 10*3/uL (ref 150–400)
RBC: 3.89 MIL/uL (ref 3.87–5.11)
RDW: 15.8 % — AB (ref 11.5–15.5)
WBC: 7.9 10*3/uL (ref 4.0–10.5)

## 2017-02-06 LAB — COMPREHENSIVE METABOLIC PANEL
ALT: 9 U/L — ABNORMAL LOW (ref 14–54)
AST: 12 U/L — AB (ref 15–41)
Albumin: 3.3 g/dL — ABNORMAL LOW (ref 3.5–5.0)
Alkaline Phosphatase: 73 U/L (ref 38–126)
Anion gap: 15 (ref 5–15)
BUN: 39 mg/dL — AB (ref 6–20)
CALCIUM: 9.2 mg/dL (ref 8.9–10.3)
CO2: 20 mmol/L — ABNORMAL LOW (ref 22–32)
CREATININE: 2.35 mg/dL — AB (ref 0.44–1.00)
Chloride: 101 mmol/L (ref 101–111)
GFR calc Af Amer: 26 mL/min — ABNORMAL LOW (ref >60)
GFR, EST NON AFRICAN AMERICAN: 22 mL/min — AB (ref >60)
GLUCOSE: 107 mg/dL — AB (ref 65–99)
POTASSIUM: 3.1 mmol/L — AB (ref 3.5–5.1)
Sodium: 136 mmol/L (ref 135–145)
Total Bilirubin: 0.5 mg/dL (ref 0.3–1.2)
Total Protein: 7.3 g/dL (ref 6.5–8.1)

## 2017-02-06 LAB — RAPID URINE DRUG SCREEN, HOSP PERFORMED
Amphetamines: NOT DETECTED
BARBITURATES: NOT DETECTED
Benzodiazepines: NOT DETECTED
Cocaine: POSITIVE — AB
Opiates: POSITIVE — AB
Tetrahydrocannabinol: POSITIVE — AB

## 2017-02-06 LAB — URINALYSIS, ROUTINE W REFLEX MICROSCOPIC
Bilirubin Urine: NEGATIVE
Glucose, UA: NEGATIVE mg/dL
Ketones, ur: NEGATIVE mg/dL
LEUKOCYTES UA: NEGATIVE
NITRITE: NEGATIVE
PROTEIN: 100 mg/dL — AB
SPECIFIC GRAVITY, URINE: 1.021 (ref 1.005–1.030)
pH: 5 (ref 5.0–8.0)

## 2017-02-06 LAB — MAGNESIUM: Magnesium: 2.1 mg/dL (ref 1.7–2.4)

## 2017-02-06 LAB — I-STAT CG4 LACTIC ACID, ED: Lactic Acid, Venous: 0.81 mmol/L (ref 0.5–1.9)

## 2017-02-06 MED ORDER — HYDROMORPHONE HCL 1 MG/ML IJ SOLN
1.0000 mg | Freq: Once | INTRAMUSCULAR | Status: AC
  Administered 2017-02-06: 1 mg via INTRAVENOUS
  Filled 2017-02-06: qty 1

## 2017-02-06 MED ORDER — POTASSIUM CHLORIDE CRYS ER 20 MEQ PO TBCR
40.0000 meq | EXTENDED_RELEASE_TABLET | Freq: Once | ORAL | Status: AC
  Administered 2017-02-06: 40 meq via ORAL
  Filled 2017-02-06: qty 2

## 2017-02-06 MED ORDER — FERROUS SULFATE 325 (65 FE) MG PO TABS
325.0000 mg | ORAL_TABLET | Freq: Two times a day (BID) | ORAL | Status: DC
  Administered 2017-02-06 – 2017-02-08 (×6): 325 mg via ORAL
  Filled 2017-02-06 (×6): qty 1

## 2017-02-06 MED ORDER — MORPHINE SULFATE (PF) 4 MG/ML IV SOLN
4.0000 mg | Freq: Once | INTRAVENOUS | Status: AC
  Administered 2017-02-06: 4 mg via INTRAVENOUS
  Filled 2017-02-06: qty 1

## 2017-02-06 MED ORDER — ADULT MULTIVITAMIN W/MINERALS CH
1.0000 | ORAL_TABLET | Freq: Every day | ORAL | Status: DC
  Administered 2017-02-06 – 2017-02-08 (×3): 1 via ORAL
  Filled 2017-02-06 (×3): qty 1

## 2017-02-06 MED ORDER — OXYCODONE HCL 5 MG PO TABS
10.0000 mg | ORAL_TABLET | ORAL | Status: DC | PRN
Start: 2017-02-06 — End: 2017-02-08
  Administered 2017-02-06 – 2017-02-08 (×9): 10 mg via ORAL
  Filled 2017-02-06 (×9): qty 2

## 2017-02-06 MED ORDER — SODIUM CHLORIDE 0.9 % IV BOLUS (SEPSIS)
1000.0000 mL | Freq: Once | INTRAVENOUS | Status: AC
  Administered 2017-02-06: 1000 mL via INTRAVENOUS

## 2017-02-06 MED ORDER — SODIUM CHLORIDE 0.9 % IV SOLN
INTRAVENOUS | Status: AC
  Administered 2017-02-06: 10:00:00 via INTRAVENOUS
  Administered 2017-02-07: 100 mL/h via INTRAVENOUS

## 2017-02-06 MED ORDER — HEPARIN SODIUM (PORCINE) 5000 UNIT/ML IJ SOLN
5000.0000 [IU] | Freq: Three times a day (TID) | INTRAMUSCULAR | Status: DC
  Administered 2017-02-06 – 2017-02-08 (×6): 5000 [IU] via SUBCUTANEOUS
  Filled 2017-02-06 (×6): qty 1

## 2017-02-06 MED ORDER — PIPERACILLIN-TAZOBACTAM 3.375 G IVPB 30 MIN
3.3750 g | Freq: Once | INTRAVENOUS | Status: AC
  Administered 2017-02-06: 3.375 g via INTRAVENOUS
  Filled 2017-02-06: qty 50

## 2017-02-06 MED ORDER — AMLODIPINE BESYLATE 10 MG PO TABS
10.0000 mg | ORAL_TABLET | Freq: Every day | ORAL | Status: DC
  Administered 2017-02-06 – 2017-02-08 (×4): 10 mg via ORAL
  Filled 2017-02-06 (×3): qty 1
  Filled 2017-02-06: qty 2

## 2017-02-06 MED ORDER — TAB-A-VITE/IRON PO TABS: 1.0000 | ORAL_TABLET | Freq: Every day | ORAL | Status: DC

## 2017-02-06 MED ORDER — ONDANSETRON HCL 4 MG PO TABS: 4.0000 mg | ORAL_TABLET | Freq: Four times a day (QID) | ORAL | Status: DC | PRN

## 2017-02-06 MED ORDER — ONDANSETRON HCL 4 MG/2ML IJ SOLN: 4.0000 mg | Freq: Four times a day (QID) | INTRAMUSCULAR | Status: DC | PRN

## 2017-02-06 MED ORDER — SENNA 8.6 MG PO TABS
1.0000 | ORAL_TABLET | Freq: Every day | ORAL | Status: DC | PRN
  Administered 2017-02-06: 8.6 mg via ORAL
  Filled 2017-02-06: qty 1

## 2017-02-06 MED ORDER — ONDANSETRON HCL 4 MG/2ML IJ SOLN
4.0000 mg | Freq: Once | INTRAMUSCULAR | Status: AC
  Administered 2017-02-06: 4 mg via INTRAVENOUS
  Filled 2017-02-06: qty 2

## 2017-02-06 MED ORDER — OXYCODONE HCL 5 MG PO TABS
10.0000 mg | ORAL_TABLET | Freq: Four times a day (QID) | ORAL | Status: DC | PRN
  Administered 2017-02-06 (×2): 10 mg via ORAL
  Filled 2017-02-06 (×2): qty 2

## 2017-02-06 MED ORDER — VANCOMYCIN HCL IN DEXTROSE 1-5 GM/200ML-% IV SOLN
1000.0000 mg | Freq: Once | INTRAVENOUS | Status: AC
  Administered 2017-02-06: 1000 mg via INTRAVENOUS
  Filled 2017-02-06: qty 200

## 2017-02-06 MED ORDER — HYDRALAZINE HCL 10 MG PO TABS
10.0000 mg | ORAL_TABLET | Freq: Three times a day (TID) | ORAL | Status: DC
  Administered 2017-02-06 – 2017-02-08 (×7): 10 mg via ORAL
  Filled 2017-02-06 (×7): qty 1

## 2017-02-06 NOTE — H&P (Signed)
Date: 02/06/2017               Patient Name:  [name] MRN: [id number]  DOB: Mar 13, 1961 Age / Sex: 56 y.o., female   PCP: Zada Finders, MD         Medical Service: Internal Medicine Teaching Service         Attending Physician: Dr. Bartholomew Crews, MD    First Contact: Dr. Reesa Chew Pager: [phone]  Second Contact: Dr. 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 Two days ago she noticed an abscess over her right ring finger and that her legs had more drainage and skin break down. She also developed nausea and vomited once yesterday but those symptoms have since resolved. She denies fever or chills.  In the ED she was afebrile, tachycardic 90-100 bpm, RR 20, BP 180/83, SpO2 98% on room air. Na 136, K 3.1, CO2 20, BUN 39, Crt 2.35, glucose 107, AST 12, ALT 9, WBC 7.9, Hgb 10.1.  In the ED she received a dose of vanc and zosyn. dilaudid, morphine, oxycodone, zofran, and 1 L NS, blood cultures were drawn and the abscess over her right ring finger was incised and drained. Xrays were obtained and reveled a questionable area of osteomyelitis over her right medial malleolus.   Meds:  Current Meds  Medication Sig  . ibuprofen (ADVIL,MOTRIN) 600 MG tablet Take 1 tablet (600 mg total) by mouth every 8 (eight) hours as needed for mild pain or moderate pain.  Marland Kitchen lisinopril (PRINIVIL,ZESTRIL) 40 MG tablet Take 1 tablet (40 mg total) by mouth daily.     Allergies: Allergies as of 02/05/2017 - Review Complete 02/01/2017  Allergen Reaction Noted  . Acetaminophen Swelling and Other (See Comments)   . Other Other (See Comments) 10/03/2016   Past Medical History:    Diagnosis Date  . CAP (community acquired pneumonia) 03/2014 X 2  . Cocaine abuse    ongoing with resultant vaculitis.  . Depression   . Headache    "weekly" (07/29/2016)  . Hypertension   . Inflammatory arthritis   . Migraines    "probably 5-6/yr" (07/29/2016)  . Normocytic anemia    BL Hgb 9.8-12. Last anemia panel 04/2010 - showing Fe 19, ferritin 101.  Pt on monthly B12 injections  . Rheumatoid arthritis(714.0)    patient reported  . 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 Followed by Dr. Louanne Skye   Family History:  Family History  Problem Relation Age of Onset  . Breast cancer Mother     Breast cancer  . Alcohol abuse Mother   . Colon cancer Maternal Aunt 106  . Alcohol abuse Father    Social History: Smokes about 6 cigarettes per day for the last 40 years. Admits to using crackcocaine, last used two days ago. Denies alcohol or illicit drug use.   Review of Systems: A complete ROS was negative except as per HPI.   Physical Exam: Blood pressure (!) 220/97, pulse 88, temperature 98.6 F (37 C), temperature source Rectal, resp. rate 13, SpO2 100 %. Physical Exam  Constitutional:  She is oriented to person, place, and time. No distress.  HENT:  Head: Normocephalic and atraumatic.  Moist mucous membranes  Cardiovascular: Normal rate and regular rhythm.   No murmur heard. Pulmonary/Chest: Effort normal. No respiratory distress. She has no wheezes. She has no rales.  Abdominal: Soft. Bowel sounds are normal. She exhibits no distension. There is no tenderness. There is no guarding.  Neurological: She is alert and oriented to person, place, and time.  Skin: Skin is warm and dry. She is not diaphoretic.  Shallow wound over her left ring finger, dressings clean dry and intact over her right ring finger and both lower extremities   Xray right index finger: personal review of the xray revealed large soft tissue swelling.   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 Underlying osteomyelitis  Patient has chronic wounds over her lower legs and fingers related to levamisole vasculitis. Abscess over her right ring finger was I&D-ed in the emergency room. Xrays were taken of her right ring finger and b/l tibia and fibula and were concerning for osteomyelitis of her right medial malleolus. She received 1 dose of vanc and zosyn.    - follow up MRI right ankle  -discontinued vanc and zosyn  -oxycodone for pain   AKI  Crt 2.35 up from baseline 0.88, may be related to dehydration.  - Ordered NS gtt  -avoid nephrotoxic agents   Hypertension  BP not currently controlled  -Continue home med amlodipine 10 mg daily -hold lisinopril for AKI   Hypokalemia  Repleating  -Follow up BMP    Iron deficient anemia  Hgb 10.1 which is at her b/l 9-10.   - continue home medication ferrous sulfate  Dispo: Admit patient to Observation with expected length of stay less than 2 midnights.  Signed: Ledell Noss, MD 02/06/2017, 6:44 AM  Pager: [phone]

## 2017-02-06 NOTE — ED Notes (Signed)
Attempted ivx2 unsuccessful

## 2017-02-06 NOTE — ED Provider Notes (Signed)
[facility] DEPT Provider Note   CSN: [id number] Arrival date & time: 02/05/17  2340     History   Chief Complaint Chief Complaint  Patient presents with  . Leg Pain    HPI [name] is a 56 y.o. female.  HPI   Patient is a 56 year old female with history of Levimasole induced vasculitis secondary to cocaine abuse, hyperlipidemia, hypertension, and depression who presents to the ED with complaint of worsening pain and drainage to bilateral leg wounds. Patient reports over the past 2 days she has had worsening pain with associated bloody purulent drainage from both of her chronic wounds to her lower legs. She also reports having subjective fever with associated chills and bodyaches over the past few days. Patient denies any recent use of antibiotics. She notes she has been changing her dressings at home and notes she is scheduled to follow up with the wound clinic in June. Denies redness, swelling or warmth to her lower legs. She notes she has had 2 episodes of vomiting yesterday. She also reports having worsening swelling, redness and warmth to her right ring finger that started 4 days ago. Patient reports 2 days ago while she was in the shower she was able to expel a small amount of bloody purulent drainage from the tip of her finger but notes the swelling significantly worsened over the past few days. Denies cough, chest pain, shortness of breath, abdominal pain, numbness, weakness. Patient denies any recent injury, trauma or puncture wound to her finger.  Past Medical History:  Diagnosis Date  . CAP (community acquired pneumonia) 03/2014 X 2  . Cocaine abuse    ongoing with resultant vaculitis.  . Depression   . Headache    "weekly" (07/29/2016)  . Hypertension   . Inflammatory arthritis   . Migraines    "probably 5-6/yr" (07/29/2016)  . Normocytic anemia    BL Hgb 9.8-12. Last anemia panel 04/2010 - showing Fe 19, ferritin 101.  Pt on monthly B12 injections  . Rheumatoid  arthritis(714.0)    patient reported  . 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 Followed by Dr. Louanne Skye    Patient Active Problem List   Diagnosis Date Noted  . Wound healing, delayed   . Osteomyelitis ([place])   . Ulcer with gangrene, with necrosis of muscle ([place])   . Skin ulcer of lower leg with necrosis of muscle, unspecified laterality ([place]) 06/04/2015  . Protein-calorie malnutrition, severe ([place]) 01/15/2015  . MDD (major depressive disorder), recurrent episode, severe ([place]) 10/16/2014  . Cocaine use disorder, severe, dependence ([place]) 10/10/2014  . 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 Essential hypertension 02/26/2010    Past Surgical History:  Procedure Laterality Date  . AMPUTATION Left 05/22/2016   Procedure: AMPUTATION LEFT LONG FINGER;  Surgeon: Marybelle Killings, MD;  Location: [facility];  Service: Orthopedics;  Laterality: Left;  . HERNIA REPAIR     "stomach"  . I&D EXTREMITY Right 09/26/2015   Procedure: IRRIGATION AND DEBRIDEMENT LEG WOUND  VAC PLACEMENT.;  Surgeon: Loel Lofty Dillingham, DO;  Location: [facility];  Service: Plastics;  Laterality: Right;  . INCISION AND DRAINAGE OF WOUND Bilateral 10/20/2016   Procedure: IRRIGATION AND DEBRIDEMENT WOUND BILATERAL;  Surgeon: Edrick Kins, DPM;  Location: [facility];  Service: Podiatry;  Laterality: Bilateral;  . IRRIGATION AND DEBRIDEMENT ABSCESS Bilateral 09/26/2013   Procedure: DEBRIDEMENT ULCERS BILATERAL THIGHS;  Surgeon: Gwenyth Ober, MD;  Location:  [facility];  Service: General;  Laterality: Bilateral;  . SKIN BIOPSY Bilateral    shin nodules    OB History    No data available       Home Medications    Prior to Admission medications   Medication Sig Start Date End Date Taking? Authorizing Provider  ibuprofen (ADVIL,MOTRIN) 600 MG tablet Take 1 tablet (600 mg total) by mouth every 8 (eight) hours as needed for mild pain or moderate pain. 01/20/17  Yes Milagros Loll, MD  lisinopril (PRINIVIL,ZESTRIL) 40 MG tablet Take 1 tablet (40 mg total) by mouth daily. 11/05/16  Yes Burns, Alexa R, MD  amLODipine (NORVASC) 10 MG tablet Take 1 tablet (10 mg total) by mouth daily. Patient not taking: Reported on 02/01/2017 12/29/16   Molt, Bethany, DO  collagenase (SANTYL) ointment Apply topically daily. Patient not taking: Reported on 02/01/2017 10/23/16   Minus Liberty, MD  feeding supplement (BOOST / RESOURCE BREEZE) LIQD Take 1 Container by mouth 3 (three) times daily between meals. Patient not taking: Reported on 02/01/2017 10/23/16   Minus Liberty, MD  ferrous sulfate 325 (65 FE) MG tablet Take 1 tablet (325 mg total) by mouth 2 (two) times daily with a meal. Patient not taking: Reported on 02/01/2017 12/28/16   Molt, Bethany, DO  Multiple Vitamins-Iron (MULTIVITAMINS WITH IRON) TABS tablet Take 1 tablet by mouth daily. Patient not taking: Reported on 02/01/2017 10/24/16   Minus Liberty, MD  Oxycodone HCl 10 MG TABS Take 1 tablet (10 mg total) by mouth every 6 (six) hours as needed (severe pain). Patient not taking: Reported on 02/06/2017 01/20/17   Milagros Loll, MD  senna (SENOKOT) 8.6 MG TABS tablet Take 1 tablet (8.6 mg total) by mouth daily as needed for mild constipation. Patient not taking: Reported on 02/01/2017 10/23/16   Minus Liberty, MD    Family History Family History  Problem Relation Age of Onset  . Breast cancer Mother     Breast cancer  . Alcohol abuse Mother   . Colon cancer Maternal Aunt 51  . Alcohol abuse Father     Social History Social History  Substance Use Topics  . Smoking status: Current Every Day Smoker    Packs/day: 0.12    Years: 38.00    Types: Cigarettes  . Smokeless tobacco: Never Used     Comment: 2 A DAY  . Alcohol use No     Allergies   Acetaminophen and Other   Review of Systems Review of Systems  Constitutional: Positive for chills and fever (subjective).  Gastrointestinal:  Positive for vomiting.  Musculoskeletal: Positive for arthralgias (right ring finger), joint swelling and myalgias (generalized).  Skin: Positive for color change (redness) and wound (BLE).  All other systems reviewed and are negative.    Physical Exam Updated Vital Signs BP (!) 181/83   Pulse 94   Temp 98.6 F (37 C) (Rectal)   Resp 13   SpO2 98%   Physical Exam  Constitutional: She is oriented to person, place, and time. She appears well-developed and well-nourished.  Pt appears to be in mild discomfort.  HENT:  Head: Normocephalic and atraumatic.  Mouth/Throat: Oropharynx is clear and moist. No oropharyngeal exudate.  Eyes: Conjunctivae and EOM are normal. Right eye exhibits no discharge. Left eye exhibits no discharge. No scleral icterus.  Neck: Normal range of motion. Neck supple.  Cardiovascular: Regular rhythm, normal heart sounds and intact distal pulses.   Mildly tachycardic, HR 104  Pulmonary/Chest: Effort normal  and breath sounds normal. No respiratory distress. She has no wheezes. She has no rales. She exhibits no tenderness.  Abdominal: Soft. Bowel sounds are normal. She exhibits no distension and no mass. There is no tenderness. There is no rebound and no guarding.  Musculoskeletal: Normal range of motion. She exhibits no edema.  FROM of BLE with reported pain with ROM of bilateral lower legs. 2+ DP pulses. Sensation grossly intact.   Moderate swelling present to right ring finger with limited ROM of finger due to swelling and pain. FROM of right wrist, hand and other digits. 2+ radial pulse. Sensation grossly intact.   Neurological: She is alert and oriented to person, place, and time.  Skin: Skin is warm and dry.  Open ulcers present to bilateral posterior lower legs with bloody purulent drainage present.  Moderate swelling, erythema, fluctuance and warmth present to distal half of right ring finger with TTP. No drainage. No puncture wounds noted. Nail intact.     Nursing note and vitals reviewed.             ED Treatments / Results  Labs (all labs ordered are listed, but only abnormal results are displayed) Labs Reviewed  COMPREHENSIVE METABOLIC PANEL - Abnormal; Notable for the following:       Result Value   Potassium 3.1 (*)    CO2 20 (*)    Glucose, Bld 107 (*)    BUN 39 (*)    Creatinine, Ser 2.35 (*)    Albumin 3.3 (*)    AST 12 (*)    ALT 9 (*)    GFR calc non Af Amer 22 (*)    GFR calc Af Amer 26 (*)    All other components within normal limits  CBC WITH DIFFERENTIAL/PLATELET - Abnormal; Notable for the following:    Hemoglobin 10.1 (*)    HCT 30.3 (*)    MCV 77.9 (*)    RDW 15.8 (*)    All other components within normal limits  CULTURE, BLOOD (ROUTINE X 2)  CULTURE, BLOOD (ROUTINE X 2)  AEROBIC CULTURE (SUPERFICIAL SPECIMEN)  URINALYSIS, ROUTINE W REFLEX MICROSCOPIC  I-STAT CG4 LACTIC ACID, ED    EKG  EKG Interpretation None       Radiology Dg Tibia/fibula Left  Result Date: 02/06/2017 CLINICAL DATA:  Skin breakdown and purulent discharge. EXAM: LEFT TIBIA AND FIBULA - 2 VIEW COMPARISON:  12/24/2016, 12/22/2016. FINDINGS: Soft tissue ulceration of the distal leg posteriorly and medially. No frank bony destruction to confirm osteomyelitis. No soft tissue foreign body or soft tissue gas beyond the ulcer. IMPRESSION: Soft tissue ulceration without frank radiographic evidence of osteomyelitis. MRI would be more sensitive if additional imaging is clinically warranted. Electronically Signed   By: Andreas Newport M.D.   On: 02/06/2017 01:31   Dg Tibia/fibula Right  Result Date: 02/06/2017 CLINICAL DATA:  Skin breakdown and soft tissue ulcerations with purulent drainage. EXAM: RIGHT TIBIA AND FIBULA - 2 VIEW COMPARISON:  None. FINDINGS: Large area of soft tissue ulceration in the posterior medial distal leg. Bone loss at the medial malleolus is suspicious for osteomyelitis. There is marked focal demineralization in  this area as well as loss of cortical definition. IMPRESSION: Bone loss at the medial malleolus, suspicious for osteomyelitis. Electronically Signed   By: Andreas Newport M.D.   On: 02/06/2017 01:36   Dg Finger Ring Right  Result Date: 02/06/2017 CLINICAL DATA:  Soft tissue swelling and drainage. EXAM: RIGHT RING FINGER 2+V COMPARISON:  None.  FINDINGS: Marked soft tissue swelling of the fourth digit. No soft tissue gas. No foreign body is evident. No bony destruction to confirm osteomyelitis. Articulations are intact. IMPRESSION: No radiographic findings to confirm osteomyelitis. MRI will be more sensitive if additional imaging is clinically warranted. Electronically Signed   By: Andreas Newport M.D.   On: 02/06/2017 01:33    Procedures .Marland KitchenIncision and Drainage Date/Time: 02/06/2017 4:47 AM Performed by: Nona Dell Authorized by: Nona Dell   Consent:    Consent obtained:  Verbal   Consent given by:  Patient Location:    Type:  Abscess   Location: right ring finger. Pre-procedure details:    Skin preparation:  Betadine Anesthesia (see MAR for exact dosages):    Anesthesia method:  None Procedure type:    Complexity:  Simple Procedure details:    Incision types:  Stab incision   Incision depth:  Dermal   Scalpel blade:  11   Wound management:  Irrigated with saline   Drainage:  Bloody and purulent   Drainage amount:  Moderate   Wound treatment:  Wound left open   Packing materials:  None Post-procedure details:    Patient tolerance of procedure:  Tolerated well, no immediate complications    (including critical care time)  Medications Ordered in ED Medications  vancomycin (VANCOCIN) IVPB 1000 mg/200 mL premix (1,000 mg Intravenous New Bag/Given 02/06/17 0449)  sodium chloride 0.9 % bolus 1,000 mL (not administered)  morphine 4 MG/ML injection 4 mg (4 mg Intravenous Given 02/06/17 0311)  ondansetron (ZOFRAN) injection 4 mg (4 mg Intravenous Given  02/06/17 0311)  piperacillin-tazobactam (ZOSYN) IVPB 3.375 g (3.375 g Intravenous New Bag/Given 02/06/17 0448)  HYDROmorphone (DILAUDID) injection 1 mg (1 mg Intravenous Given 02/06/17 0417)     Initial Impression / Assessment and Plan / ED Course  I have reviewed the triage vital signs and the nursing notes.  Pertinent labs & imaging results that were available during my care of the patient were reviewed by me and considered in my medical decision making (see chart for details).     Patient presents with worsening drainage from her chronic lower leg wounds. She also reports having worsening pain and swelling to her right ring finger over the past 4 days. Reports subjective fever. Denies any current use of antibiotics. VSS, afebrile. Exam revealed severe chronic bilateral lower leg wounds with moderate amount of bloody purulent drainage present to both legs which appears significantly worsened from pictures taken during pt's most recent admission the end of March. Pt with moderate swelling, erythema, warmth and induration present to right ring finger, right hand and otherwise neurovascularly intact. Pt given IVF and pain meds. Right ring finger x-ray revealed no findings to confirm osteomyelitis, marked soft tissue swelling present without foreign bodies. I&D performed of right ring finger without any locations. Right tib-fib x-ray revealed bone loss at medial malleolus suspicious for osteomyelitis. Left tib-fib x-ray revealed soft tissue ulceration without evidence of osteomyelitis. Patient started on IV Vanco and Zosyn for suspected infection of chronic leg ulcers. Lactic 0.8. Potassium 3.1. Creatinine 2.35, BUNs 39. Due to patient with significant worsening of chronic leg ulcers and signs of infection on exam, plan to admit patient for further management. Consultation internal medicine who agrees to admission. Discussed results and plan for admission with patient.  Final Clinical Impressions(s) / ED  Diagnoses   Final diagnoses:  Wound infection  Abscess of finger of right hand    New Prescriptions New Prescriptions  No medications on file     Nona Dell, Hershal Coria 02/06/17 [place], Delice Bison, DO 02/06/17 [phone]

## 2017-02-06 NOTE — ED Triage Notes (Signed)
Pt to ED from home c/o "holes in both lower legs due to poor circulation." Pt reports having had a pump on both legs last year that would help her blood flow, that caused holes in her legs which have caused repeat infections. Multiple areas of skin breakdown with serosanguinous and pus-like discharge. CSM intact distal to wounds. Pt also c/o R ring finger swelling x 2 days, unable to bend finger. Finger appears to have large blister surrounding it. Pt states she has had fevers intermittently over past couple days. Pt wheelchair bound.

## 2017-02-06 NOTE — Progress Notes (Signed)
Subjective: Patient was hungry and asking for food when seen this morning. She was also complaining of pain in her right ring finger but also stating that it is improved since incision and drainage. According to patient her next wound care appointment is on May 10 and she ran out of her supplies but denies any worsening pain in her chronic wounds but do admit increase in discharge of her left leg.  Objective:  Vital signs in last 24 hours: Vitals:   02/06/17 0621 02/06/17 0645 02/06/17 0700 02/06/17 0750  BP: (!) 220/97 (!) 195/86 (!) 191/94 (!) 205/98  Pulse:    100  Resp:  13 15   Temp:    98.9 F (37.2 C)  TempSrc:    Oral  SpO2:  100%  91%   Gen. Malnourished, emaciated lady, in no acute distress. Lungs. Clear bilaterally. CV. Regular rate and rhythm. Extremities. She had a small incision on her right index finger and a small spontaneous opening on the tip of the same finger with serous discharge. Her lower extremity chronic wounds were wrapped in clean dressing.  Labs. Urinalysis    Component Value Date/Time   COLORURINE YELLOW 02/06/2017 0615   APPEARANCEUR HAZY (A) 02/06/2017 0615   LABSPEC 1.021 02/06/2017 0615   PHURINE 5.0 02/06/2017 0615   GLUCOSEU NEGATIVE 02/06/2017 0615   HGBUR SMALL (A) 02/06/2017 0615   BILIRUBINUR NEGATIVE 02/06/2017 0615   BILIRUBINUR small 04/29/2015 1533   KETONESUR NEGATIVE 02/06/2017 0615   PROTEINUR 100 (A) 02/06/2017 0615   UROBILINOGEN 1.0 06/14/2015 2234   NITRITE NEGATIVE 02/06/2017 0615   LEUKOCYTESUR NEGATIVE 02/06/2017 0615   Drugs of Abuse     Component Value Date/Time   LABOPIA POSITIVE (A) 02/06/2017 0615   COCAINSCRNUR POSITIVE (A) 02/06/2017 0615   COCAINSCRNUR NEG 08/31/2012 1045   LABBENZ NONE DETECTED 02/06/2017 0615   LABBENZ NEGATIVE 06/12/2011 0221   AMPHETMU NONE DETECTED 02/06/2017 0615   THCU POSITIVE (A) 02/06/2017 0615   LABBARB NONE DETECTED 02/06/2017 0615    MRI right foot. FINDINGS: Distal  lower leg and ankle findings are dictated separately.  Bones/Joint/Cartilage  Again demonstrated is severe arthropathy at the first metatarsal phalangeal joint with a hallux valgus deformity, lateral subluxation of the great toe and progressive erosions of the first metatarsal head and medial proximal phalangeal base. These osseous findings have mildly progressed compared with the previous MRI from 7 months ago. No significant joint effusion.  The additional metatarsal phalangeal joints appear normal. The alignment is normal at the Lisfranc joint. No other significant osseous findings.  Ligaments The Lisfranc ligament is intact.  Muscles and Tendons  The forefoot muscles and tendons appear intact.  Soft tissues  There are chronic inflammatory changes within the soft tissues surrounding the first metatarsal phalangeal joint, similar to previous study. No focal fluid collection or obvious draining tract identified. Small pressure lesions in the plantar aspect of the forefoot are similar to previous study.  IMPRESSION: 1. Progressive advanced arthropathy at the first metatarsal phalangeal joint compared with previous study from 7 months ago. These findings are nonspecific. Chronic osteomyelitis cannot be completely excluded. 2. No obvious sinus tract or soft tissue abscess. 3. No other significant findings. Ankle findings are dictated separately.  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 Two days ago she noticed an abscess over her right ring finger and that  her legs had more drainage and skin break down. She also developed nausea and vomited once yesterday.  Chronic wounds with new right ring finger abscess. Her MRI was negative for any new osteomyelitis, chronic osteomyelitis cannot be ruled out. She remained afebrile and has no  leukocytosis. She had an I and D for her right ring finger abscess. -Wound care for her wounds. -Oxycodone for pain.  AK I. Most likely prerenal because of her ongoing cocaine use and dehydration. -Repeat BMP.  Hypertension. Blood pressure remained elevated. We will continue amlodipine 10 mg daily. -We will add hydralazine 10 mg every 8 hourly. -Continue to hold lisinopril because of AK I.  Hypokalemia. Potassium was replaced. -Repeat BMP.  Iron deficient anemia  Hgb 10.1 which is at her b/l 9-10.   - continue home ferrous sulfate.  Dispo: Anticipated discharge in approximately 1-2 day(s).   Lorella Nimrod, MD 02/06/2017, 2:28 PM Pager: [phone]

## 2017-02-06 NOTE — ED Notes (Signed)
Per admitting MD, pt NPO until MRI performed.

## 2017-02-06 NOTE — ED Notes (Signed)
Pt asking for food, informed that admitting MD wants to wait until MRI is performed before allowing pt to eat.

## 2017-02-07 DIAGNOSIS — I776 Arteritis, unspecified: Secondary | ICD-10-CM | POA: Diagnosis not present

## 2017-02-07 DIAGNOSIS — L02511 Cutaneous abscess of right hand: Secondary | ICD-10-CM | POA: Diagnosis present

## 2017-02-07 DIAGNOSIS — F14288 Cocaine dependence with other cocaine-induced disorder: Secondary | ICD-10-CM | POA: Diagnosis not present

## 2017-02-07 DIAGNOSIS — E860 Dehydration: Secondary | ICD-10-CM | POA: Diagnosis present

## 2017-02-07 DIAGNOSIS — N179 Acute kidney failure, unspecified: Secondary | ICD-10-CM | POA: Diagnosis present

## 2017-02-07 DIAGNOSIS — Z886 Allergy status to analgesic agent status: Secondary | ICD-10-CM | POA: Diagnosis not present

## 2017-02-07 DIAGNOSIS — E43 Unspecified severe protein-calorie malnutrition: Secondary | ICD-10-CM | POA: Diagnosis present

## 2017-02-07 DIAGNOSIS — D509 Iron deficiency anemia, unspecified: Secondary | ICD-10-CM | POA: Diagnosis present

## 2017-02-07 DIAGNOSIS — F14188 Cocaine abuse with other cocaine-induced disorder: Secondary | ICD-10-CM | POA: Diagnosis present

## 2017-02-07 DIAGNOSIS — Z681 Body mass index (BMI) 19 or less, adult: Secondary | ICD-10-CM | POA: Diagnosis not present

## 2017-02-07 DIAGNOSIS — E876 Hypokalemia: Secondary | ICD-10-CM | POA: Diagnosis present

## 2017-02-07 DIAGNOSIS — Z79899 Other long term (current) drug therapy: Secondary | ICD-10-CM | POA: Diagnosis not present

## 2017-02-07 DIAGNOSIS — E785 Hyperlipidemia, unspecified: Secondary | ICD-10-CM | POA: Diagnosis present

## 2017-02-07 DIAGNOSIS — M064 Inflammatory polyarthropathy: Secondary | ICD-10-CM | POA: Diagnosis present

## 2017-02-07 DIAGNOSIS — F329 Major depressive disorder, single episode, unspecified: Secondary | ICD-10-CM | POA: Diagnosis present

## 2017-02-07 DIAGNOSIS — M79644 Pain in right finger(s): Secondary | ICD-10-CM | POA: Diagnosis not present

## 2017-02-07 DIAGNOSIS — F17210 Nicotine dependence, cigarettes, uncomplicated: Secondary | ICD-10-CM | POA: Diagnosis present

## 2017-02-07 DIAGNOSIS — L03011 Cellulitis of right finger: Secondary | ICD-10-CM | POA: Diagnosis present

## 2017-02-07 DIAGNOSIS — I10 Essential (primary) hypertension: Secondary | ICD-10-CM | POA: Diagnosis present

## 2017-02-07 DIAGNOSIS — L958 Other vasculitis limited to the skin: Secondary | ICD-10-CM | POA: Diagnosis not present

## 2017-02-07 LAB — CBC
HCT: 28.5 % — ABNORMAL LOW (ref 36.0–46.0)
HEMOGLOBIN: 8.8 g/dL — AB (ref 12.0–15.0)
MCH: 24.7 pg — AB (ref 26.0–34.0)
MCHC: 30.9 g/dL (ref 30.0–36.0)
MCV: 80.1 fL (ref 78.0–100.0)
Platelets: 353 10*3/uL (ref 150–400)
RBC: 3.56 MIL/uL — AB (ref 3.87–5.11)
RDW: 15.5 % (ref 11.5–15.5)
WBC: 5.8 10*3/uL (ref 4.0–10.5)

## 2017-02-07 LAB — BASIC METABOLIC PANEL
ANION GAP: 8 (ref 5–15)
BUN: 22 mg/dL — ABNORMAL HIGH (ref 6–20)
CO2: 22 mmol/L (ref 22–32)
Calcium: 8.7 mg/dL — ABNORMAL LOW (ref 8.9–10.3)
Chloride: 110 mmol/L (ref 101–111)
Creatinine, Ser: 1.08 mg/dL — ABNORMAL HIGH (ref 0.44–1.00)
GFR calc Af Amer: >60 mL/min (ref >60)
GFR calc non Af Amer: 58 mL/min — ABNORMAL LOW (ref >60)
GLUCOSE: 105 mg/dL — AB (ref 65–99)
POTASSIUM: 4.1 mmol/L (ref 3.5–5.1)
Sodium: 140 mmol/L (ref 135–145)

## 2017-02-07 MED ORDER — LISINOPRIL 40 MG PO TABS
40.0000 mg | ORAL_TABLET | Freq: Every day | ORAL | Status: DC
  Administered 2017-02-07 – 2017-02-08 (×2): 40 mg via ORAL
  Filled 2017-02-07 (×2): qty 1

## 2017-02-07 NOTE — Progress Notes (Signed)
MD paged to address patients severe ulcers on her legs.MD following patient states that is her baseline.   Wet to dry dressings placed on bilateral leg ulcers, a dry gauze dressing to right ring finger and a foam dressing to the left great toe ulcer.  [facility] Nurse consulted.

## 2017-02-07 NOTE — Progress Notes (Signed)
   Subjective: Patient was seen and examined at bedside this morning. States her pain is improved compared to yesterday. She denies having any fevers, chills, nausea, or vomiting. Reports eating all her meals and having regular bowel movements. No other complaints.  Objective:  Vital signs in last 24 hours: Vitals:   02/06/17 0750 02/06/17 1510 02/06/17 2004 02/07/17 0453  BP: (!) 205/98 (!) 155/72 (!) 166/68 (!) 169/91  Pulse: 100 84 89 85  Resp:      Temp: 98.9 F (37.2 C)  99.5 F (37.5 C) 98.9 F (37.2 C)  TempSrc: Oral  Oral Oral  SpO2: 91%  99% 100%   Gen. Malnourished, emaciated lady, in no acute distress. Lungs. Clear to auscultation bilaterally. No wheezes, rales, or rhonchi. CV. Regular rate and rhythm. S1 and S2 appreciated. Grade 3/6 systolic ejection murmur appreciated.  Abd: Soft, nontender, nondistended. Positive bowel sounds. Extremities. Right hand ring finger and bilateral lower extremities covered with dressing.   Labs. 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  Chronic wounds and new right ring finger abscess. Status post I&D of right ring finger abscess in the emergency room. MRI of right ankle was negative for any new osteomyelitis. MRI of right foot showing progressive advanced arthropathy at the first metatarsophalangeal joint compared with  previous study from 7 months ago; chronic osteomyelitis cannot be ruled out. No signs or symptoms of systemic infection. She remains afebrile and does not have leukocytosis. -Continue wound care for her wounds -Oxycodone IR 10 mg every 4 when necessary. Pain is well-controlled with this regimen.  AKI (resolved). Most likely prerenal because of her ongoing cocaine use and dehydration. Serum creatinine 1.0 today.  Hypertension. Blood pressure continues to be elevated with systolic ranging from 503-888 and diastolic ranging from 28-00 in the past 24 hours. -Continue amlodipine 10 mg daily. -Continue hydralazine 10 mg every 8 hrs -Resume home Lisinopril 40 mg daily  -Continue to monitor blood pressure  Hypokalemia (resolved). Potassium were 0.1 today  Iron deficient anemia. Hemoglobin 8.8 today; baseline 9-10.   - continue home ferrous sulfate.  Dispo: Anticipated discharge in approximately 1-2 day(s). Patient needs continued pain management and wound care at this time.  Shela Leff, MD 02/07/2017, 10:50 AM Pager: [phone]

## 2017-02-08 DIAGNOSIS — N179 Acute kidney failure, unspecified: Secondary | ICD-10-CM | POA: Diagnosis not present

## 2017-02-08 DIAGNOSIS — E43 Unspecified severe protein-calorie malnutrition: Secondary | ICD-10-CM

## 2017-02-08 DIAGNOSIS — L02511 Cutaneous abscess of right hand: Secondary | ICD-10-CM | POA: Diagnosis not present

## 2017-02-08 DIAGNOSIS — L958 Other vasculitis limited to the skin: Secondary | ICD-10-CM | POA: Diagnosis not present

## 2017-02-08 LAB — MRSA PCR SCREENING: MRSA BY PCR: POSITIVE — AB

## 2017-02-08 MED ORDER — OLMESARTAN-AMLODIPINE-HCTZ 40-10-12.5 MG PO TABS: ORAL_TABLET | ORAL | 0 refills | Status: DC

## 2017-02-08 MED ORDER — MUPIROCIN CALCIUM 2 % EX CREA
TOPICAL_CREAM | Freq: Every day | CUTANEOUS | Status: DC
  Administered 2017-02-08: 14:00:00 via TOPICAL
  Filled 2017-02-08: qty 15

## 2017-02-08 MED ORDER — HYDROCHLOROTHIAZIDE 12.5 MG PO CAPS
12.5000 mg | ORAL_CAPSULE | Freq: Every day | ORAL | Status: DC
Start: 2017-02-08 — End: 2017-02-08

## 2017-02-08 MED ORDER — MUPIROCIN CALCIUM 2 % EX CREA: TOPICAL_CREAM | Freq: Every day | CUTANEOUS | 0 refills | Status: DC

## 2017-02-08 MED ORDER — OXYCODONE HCL 10 MG PO TABS: 10.0000 mg | ORAL_TABLET | Freq: Four times a day (QID) | ORAL | 0 refills | Status: DC | PRN

## 2017-02-08 NOTE — Consult Note (Signed)
[facility] Nurse wound consult note Pt is familiar to [facility] team from multiple previous admissions.  She has chronic nonhealing wounds to BLE and states she realizes they will not get better while she continues to use cocaine.  Ortho service has been consulted in the past for possible amputation surgery, but they declined since surgical wounds wound not heal if patient continues to use cocaine, according to their EMR notes.  Reason for Consult: multiple ulcers related tolevamisole vasculitis  Cellulitis to right fourth finger, purulence noted, I &D in ED.   Diagnostic imaging has ruled out osteomyelitis. Wound type: multiple full thickness ulcerations with necrosis Measurement: Right posterior calf 23cm x 12cm x 8.0cm; 40% necrotic/60% red, tendons visible, strong foul odor, large amt thick tan drainage Right lower ankle area 2X2X.3cm, 10% eschar, 90% red, mod amt thick tan drainage, strong odor. Left great toe/plantar surface: 3cm x2cm x 0.2cm; 30% black/70% red, mod amt tan drainage, strong odor,  Left inner malleolus: 6X5X.3cm 50% loose eschar/40%red, 10% yellow, mod amt thick tan drainage, strong odor  Left posterior ankleolus: 10X10X.3cm, 80% red, 20% eschar, mod amt tan drainage, strong odor Periwound: intact with evidence of scarring over the legs from previous wounds Dressing procedure/placement/frequency:  Lower legs:  Cleanse gently with NS and pat dry.   Pt will not allow any dressings except nonadherent, ABD pads, and kelrex.  Pt states, "anything else doesn't work or is too painful." She prefers to perform her own dressings.  Cleanse right fourth finger with NS and pat gently dry.  Apply Bactroban to wound bed.  Cover with gauze and kerlix/tape.  Change daily.  Will not follow at this time.  Please re-consult if needed.  Domenic Moras RN BSN [place] Pager [phone]

## 2017-02-08 NOTE — Discharge Instructions (Signed)
Dressing procedure/placement/frequency:  Lower legs:  Cleanse gently with NS and pat dry.   Pt will not allow any dressings except nonadherent, ABD pads, and kerlex. Change daily. .  Cleanse right fourth finger with NS and pat gently dry.  Apply Bactroban to wound bed.  Cover with gauze and kerlix/tape.  Change daily.

## 2017-02-08 NOTE — Discharge Summary (Signed)
Name: [name] MRN: [id number] DOB: [birth year] 56 y.o. PCP: Zada Finders, MD  Date of Admission: 02/06/2017  1:36 AM Date of Discharge: 02/08/2017 Attending Physician: Bartholomew Crews, MD  Discharge Diagnosis: 1. Right ring finger abscess. 2. 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[facility])   Protein-calorie malnutrition, severe ([place])   AKI (acute kidney injury) ([place])   Discharge Medications: Allergies as of 02/08/2017      Reactions   Acetaminophen Swelling, Other (See Comments)   Reaction:  Eyelid swelling   Other Other (See Comments)   Pt states she is allergic to a blood pressure medication, but does not know the reaction or medication      Medication List    STOP taking these medications   amLODipine 10 MG tablet Commonly known as:  NORVASC   lisinopril 40 MG tablet Commonly known as:  PRINIVIL,ZESTRIL     TAKE these medications   collagenase ointment Commonly known as:  SANTYL Apply topically daily.   feeding supplement Liqd Take 1 Container by mouth 3 (three) times daily between meals.   ferrous sulfate 325 (65 FE) MG tablet Take 1 tablet (325 mg total) by mouth 2 (two) times daily with a meal.   ibuprofen 600 MG tablet Commonly known as:  ADVIL,MOTRIN Take 1 tablet (600 mg total) by mouth every 8 (eight) hours as needed for mild pain or moderate pain.   multivitamins with iron Tabs tablet Take 1 tablet by mouth daily.   mupirocin cream 2 % Commonly known as:  BACTROBAN Apply topically daily. Start taking on:  02/09/2017   Olmesartan-Amlodipine-HCTZ 40-10-12.5 MG Tabs Commonly known as:  TRIBENZOR Take 1 tablet by mouth daily.   Oxycodone HCl 10 MG Tabs Take 1 tablet (10 mg total) by mouth every 6 (six) hours as needed (severe pain).   senna 8.6 MG Tabs tablet Commonly known as:  SENOKOT Take  1 tablet (8.6 mg total) by mouth daily as needed for mild constipation.       Disposition and follow-up:   [name] was discharged from [facility] in Good condition.  At the hospital follow up visit please address:  1.  Her blood pressure-she was started on a single pill containing Olmesartan, amlodipine and hydrochlorothiazide for better drug compliance. -Her compliance with wound care clinic.   2.  Labs / imaging needed at time of follow-up: None  3.  Pending labs/ test needing follow-up: None  Follow-up Appointments: Follow-up Information    [facility] Follow up.   Why:  You are scheduled for a hospital followup appt and to establish a PCP on Friday, Feb 12, 2017 at 1030 Am. 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 Two days ago she noticed an abscess over her right ring finger and that her legs had more drainage and skin break down.   Chronic wounds with new right ring finger abscess. She had incision and drainage done on her right ring finger abscess. She was getting daily dressing change via wound care team in hospital for  her chronic lower extremity wounds. Initially there were some concern of acute osteomyelitis with increased drainage of her left leg wound. Her MRI was negative for any acute osteomyelitis. She remained afebrile, no leukocytosis, later her wound culture grew Proteus mirabilis which was most likely a contaminant. Patient was given 1 dose of Zosyn and vancomycin in ED. No other antibiotics were continued. She was complaining of a lot of pain in her chronic wounds. Her pain was controlled with oxycodone and she was discharged on 20 tablets of  oxycodone.  She was a patient of internal medicine Center at [facility] dismissed from clinic because of multiple no-shows. Her new PCP can reassess her need of pain medications and act accordingly.  Hypertension. Initially she was on lisinopril at home, her blood pressure remained elevated and she needed addition of amlodipine and hydrochlorothiazide. We discharged her on a combination pill. Her new PCP should be able to monitor her blood pressure and titrated her antihypertensives accordingly.  Iron deficient anemia. Her hemoglobin was stable at her baseline. We continued her home dose of ferrous sulfate.  Discharge Vitals:   BP 136/72   Pulse 75   Temp 98.5 F (36.9 C) (Oral)   Resp 16   SpO2 100%   Gen.Malnourished, emaciated lady, in no acute distress. CV.Regular rate and rhythm. Lungs.Clear bilaterally. Abdomen. Soft, nontender, bowel sounds positive. Extremities. Clean bandages on both lower extremities, bandage with bloody serous discharge on right ring finger and a ruptured blister on her left small finger  with some serous discharge.  Pertinent Labs, Studies, and Procedures:  CBC Latest Ref Rng & Units 02/07/2017 02/06/2017 12/25/2016  WBC 4.0 - 10.5 K/uL 5.8 7.9 6.9  Hemoglobin 12.0 - 15.0 g/dL 8.8(L) 10.1(L) 8.5(L)  Hematocrit 36.0 - 46.0 % 28.5(L) 30.3(L) 25.8(L)  Platelets 150 - 400 K/uL 353 334 334   CMP Latest Ref Rng & Units 02/07/2017 02/06/2017 12/24/2016  Glucose 65 - 99 mg/dL 105(H) 107(H) 102(H)  BUN 6 - 20 mg/dL 22(H) 39(H) 13  Creatinine 0.44 - 1.00 mg/dL 1.08(H) 2.35(H) 0.88  Sodium 135 - 145 mmol/L 140 136 141  Potassium 3.5 - 5.1 mmol/L 4.1 3.1(L) 3.5  Chloride 101 - 111 mmol/L 110 101 113(H)  CO2 22 - 32 mmol/L 22 20(L) 21(L)  Calcium 8.9 - 10.3 mg/dL 8.7(L) 9.2 8.2(L)  Total Protein 6.5 - 8.1 g/dL - 7.3 -  Total Bilirubin 0.3 - 1.2 mg/dL - 0.5 -  Alkaline Phos 38 - 126 U/L - 73 -  AST 15 - 41 U/L - 12(L) -  ALT 14 - 54 U/L - 9(L) -    Drugs of Abuse     Component Value Date/Time   LABOPIA POSITIVE (A) 02/06/2017 0615   COCAINSCRNUR POSITIVE (A) 02/06/2017 0615   COCAINSCRNUR NEG 08/31/2012 1045   LABBENZ NONE DETECTED 02/06/2017 0615   LABBENZ NEGATIVE 06/12/2011 0221   AMPHETMU NONE DETECTED 02/06/2017 0615   THCU POSITIVE (A) 02/06/2017 0615   LABBARB NONE DETECTED 02/06/2017 0615    DG ring finger right. FINDINGS: Marked soft tissue swelling of the fourth digit. No soft tissue gas. No foreign body is evident. No bony destruction to confirm osteomyelitis. Articulations are intact.  IMPRESSION: No radiographic findings to confirm osteomyelitis. MRI will be more sensitive if additional imaging is clinically warranted.  MRI right foot. FINDINGS: Distal lower leg and ankle findings are dictated separately.  Bones/Joint/Cartilage  Again demonstrated is severe arthropathy at the first metatarsal phalangeal joint with a hallux valgus  deformity, lateral subluxation of the great toe and progressive erosions of the first metatarsal head and medial proximal phalangeal base. These osseous findings have mildly progressed compared with the previous MRI from 7 months ago. No significant joint effusion.  The additional metatarsal phalangeal joints appear normal. The alignment is normal at the Lisfranc joint. No other significant osseous findings.  Ligaments The Lisfranc ligament is intact.  Muscles and Tendons  The forefoot muscles and tendons appear intact.  Soft tissues  There are chronic inflammatory changes within the soft tissues surrounding the first metatarsal phalangeal joint, similar to previous study. No focal fluid collection or obvious draining tract identified. Small pressure lesions in the plantar aspect of the forefoot are similar to previous study.  IMPRESSION: 1. Progressive advanced arthropathy at the first metatarsal phalangeal joint compared with previous study from 7 months ago. These  findings are nonspecific. Chronic osteomyelitis cannot be completely excluded. 2. No obvious sinus tract or soft tissue abscess. 3. No other significant findings. Ankle findings are dictated separately.  MRI right ankle. FINDINGS: TENDONS  Peroneal: Intact and normally positioned.  Posteromedial: Intact and normally positioned.  Anterior: Intact and normally positioned.  Achilles: There is a large chronic ulcer posteriorly in the distal lower leg associated with chronic disruption of the Achilles tendon proximal to its insertion. No significant change from previous studies.  Plantar Fascia: Mild thickening of the plantar fascia with chronic focal edema in the subcutaneous fat near the calcaneal insertion. No evidence of fascial rupture or marrow edema.  LIGAMENTS  Lateral: The anterior and posterior talofibular and calcaneofibular ligaments are intact.  Medial: The deltoid and visualized portions of the spring ligament appear intact.  CARTILAGE AND BONES  Ankle Joint: No significant ankle joint effusion. The talar dome and tibial plafond are intact.  Subtalar Joints/Sinus Tarsi: Unremarkable.  Bones: There is no evidence of acute fracture, focal osteochondral lesion or bone destruction. Specifically, the medial malleolus is intact.  Other: As above, there is a chronic large ulcer posteriorly in the distal lower leg with resulting chronic disruption of the Achilles tendon. There are inflammatory changes in the underlying subcutaneous fat and some T2 hyperintensity within the flexor hallucis longus muscle. No focal fluid collection to suggest abscess.  IMPRESSION: 1. Chronic large ulcer posteriorly in the distal lower leg with chronic disruption of the Achilles tendon. Appearance is similar to previous studies. 2. No evidence of soft tissue abscess or osteomyelitis. 3. Forefoot findings dictated separately.   Wound or Superficial Culture  Order: [id number]    Status:  Preliminary result Visible to patient:  No (Not Released) Next appt:  02/11/2017 at 11:00 AM in [place] ([place])   2d ago  Specimen Description WOUND RIGHT FINGER   Special Requests Normal   Gram Stain FEW WBC PRESENT, PREDOMINANTLY PMN  FEW GRAM POSITIVE COCCI IN CLUSTERS  FEW GRAM NEGATIVE RODS     Culture FEW PROTEUS MIRABILIS  CULTURE REINCUBATED FOR BETTER GROWTH     Report Status PENDING   Organism ID, Bacteria PROTEUS MIRABILIS   Resulting Agency [facility]  Susceptibility          Proteus mirabilis    MIC    AMPICILLIN <=2 SENSITIVE "><=2 SENSITIVE  Sensitive    AMPICILLIN/SULBACTAM <=2 SENSITIVE "><=2 SENSITIVE  Sensitive    CEFAZOLIN <=4 SENSITIVE "><=4 SENSITIVE  Sensitive    CEFEPIME <=1 SENSITIVE "><=1 SENSITIVE  Sensitive    CEFTAZIDIME <=1 SENSITIVE "><=1 SENSITIVE  Sensitive    CEFTRIAXONE <=1 SENSITIVE "><=  1 SENSITIVE  Sensitive    CIPROFLOXACIN <=0.25 SENSITIVE "><=0.25 SENS... Sensitive    GENTAMICIN <=1 SENSITIVE "><=1 SENSITIVE  Sensitive    IMIPENEM 4 SENSITIVE  Sensitive    PIP/TAZO <=4 SENSITIVE "><=4 SENSITIVE  Sensitive    TRIMETH/SULFA <=20 SENSITIVE "><=20 SENSIT... Sensitive         Susceptibility Comments   Proteus mirabilis           Discharge Instructions: Discharge Instructions    Diet - low sodium heart healthy    Complete by:  As directed    Increase activity slowly    Complete by:  As directed       Signed: Lorella Nimrod, MD 02/08/2017, 5:28 PM   Pager: [phone]

## 2017-02-08 NOTE — Care Management Note (Signed)
Case Management Note  Patient Details  Name: DAYZHA POGOSYAN MRN: [id number] Date of Birth: 1961-01-07  Subjective/Objective:    Admitted with right 4th finger abscess and vasculitis of the legs                 Action/Plan: Case manager arranged f/u appointment for patient at the [facility] on Friday, Feb 12, 2017 at 10:30 am, she will see Molli Barrows, NP. Case manager explained to patient importance of keeping this appointment. It has been added to her discharge paperwork.  No further Case management needs.   Expected Discharge Date:   02/08/17               Expected Discharge Plan:   Home/self care   In-House Referral:     Discharge planning Services  CM Consult, [facility]  Post Acute Care Choice:  NA Choice offered to:  Patient  DME Arranged:  N/A DME Agency:     HH Arranged:  NA HH Agency:  NA  Status of Service:  Completed, signed off  If discussed at [place] of Stay Meetings, dates discussed:    Additional Comments:  Ninfa Meeker, RN 02/08/2017, 1:56 PM

## 2017-02-08 NOTE — Progress Notes (Signed)
Patient's wound dressings to bilateral lower extremities and to her right fourth finger were changed, as order by WOC, by patient. Patient would not accept help from RN.

## 2017-02-08 NOTE — Progress Notes (Signed)
Patient notified RN that her ride was on the way. Patient's iv removed. rn educated patient on discharge instructions and provided patient with prescription. Patient provided with wound care supplies, as verbally requested by MD, so that paitent could change her wound dressings at home until she was able to get to her follow up appointment

## 2017-02-08 NOTE — Progress Notes (Signed)
Subjective: Patient was complaining of pain in her lower extremity wounds. She also has a ruptured blister on left little finger with some serous drainage. Denies any fever, chills.  Objective:  Vital signs in last 24 hours: Vitals:   02/07/17 1544 02/07/17 2033 02/08/17 0540 02/08/17 1337  BP: 134/86 132/62 (!) 156/71 136/76  Pulse: (!) 109 87 75 89  Resp:  16 16   Temp: 98.8 F (37.1 C) 99.4 F (37.4 C) 99 F (37.2 C)   TempSrc: Oral Oral Oral   SpO2: 100% 99% 100%    Gen. Malnourished, emaciated lady, in no acute distress. CV. Regular rate and rhythm. Lungs. Clear bilaterally. Abdomen. Soft, nontender, bowel sounds positive. Extremities. Clean bandages on both lower extremities, bandage with bloody serous discharge on right ring finger and a ruptured blister on her left small finger  with some serous discharge.  Wound or Superficial Culture  Order: [id number]  Status:  Preliminary result Visible to patient:  No (Not Released) Next appt:  02/11/2017 at 11:00 AM in [place] ([place])   2d ago  Specimen Description WOUND RIGHT FINGER   Special Requests Normal   Gram Stain FEW WBC PRESENT, PREDOMINANTLY PMN  FEW GRAM POSITIVE COCCI IN CLUSTERS  FEW GRAM NEGATIVE RODS      Culture FEW PROTEUS MIRABILIS  CULTURE REINCUBATED FOR BETTER GROWTH      Report Status PENDING   Organism ID, Bacteria PROTEUS MIRABILIS   Resulting Agency [facility]  Susceptibility    Proteus mirabilis    MIC    AMPICILLIN <=2 SENSITIVE "><=2 SENSITIVE  Sensitive    AMPICILLIN/SULBACTAM <=2 SENSITIVE "><=2 SENSITIVE  Sensitive    CEFAZOLIN <=4 SENSITIVE "><=4 SENSITIVE  Sensitive    CEFEPIME <=1 SENSITIVE "><=1 SENSITIVE  Sensitive    CEFTAZIDIME <=1 SENSITIVE "><=1 SENSITIVE  Sensitive    CEFTRIAXONE <=1 SENSITIVE "><=1 SENSITIVE  Sensitive    CIPROFLOXACIN <=0.25 SENSITIVE "><=0.25 SENS... Sensitive    GENTAMICIN <=1 SENSITIVE "><=1 SENSITIVE  Sensitive    IMIPENEM 4 SENSITIVE   Sensitive    PIP/TAZO <=4 SENSITIVE "><=4 SENSITIVE  Sensitive    TRIMETH/SULFA <=20 SENSITIVE "><=20 SENSIT... 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 Two days ago she noticed an abscess over her right ring finger and that her legs had more drainage and skin break down. She also developed nausea and vomited once yesterday.  Chronic wounds with new right ring finger abscess. Her wound culture is growing Proteus mirabilis which is most likely a contaminant. Patient remained afebrile and without any leukocytosis. She can be discharged home with some supplies for wound care, she had a wound care appointment on 02/11/2017. -We are trying to find her a new PCP, most likely she will not be able to get one before that month, at that time we will see her in our clinic 1 time within 1-2 weeks. -We will provide her 2 week worth of pain medicine.  Hypertension. She shows little improvement after adding her home lisinopril along with hydralazine. We can discontinue hydralazine and add hydrochlorothiazide because of the convenience of taking once daily medicine. She needs a close follow-up with primary care to titrate her antihypertensives.  AK I.. Resolved.  Hypokalemia. Resolved  Iron deficient anemia. - continue home ferrous sulfate.  Dispo: Being discharged today.   Lorella Nimrod, MD 02/08/2017, 1:43 PM Pager: [phone]

## 2017-02-09 ENCOUNTER — Other Ambulatory Visit: Payer: Self-pay | Admitting: Internal Medicine

## 2017-02-09 LAB — AEROBIC CULTURE  (SUPERFICIAL SPECIMEN)

## 2017-02-09 LAB — AEROBIC CULTURE W GRAM STAIN (SUPERFICIAL SPECIMEN): Special Requests: NORMAL

## 2017-02-09 NOTE — Telephone Encounter (Signed)
NEEDS REFILL FOR PAIN MEDICATION

## 2017-02-11 ENCOUNTER — Encounter (HOSPITAL_BASED_OUTPATIENT_CLINIC_OR_DEPARTMENT_OTHER): Payer: Medicaid Other | Attending: Internal Medicine

## 2017-02-11 DIAGNOSIS — L97522 Non-pressure chronic ulcer of other part of left foot with fat layer exposed: Secondary | ICD-10-CM | POA: Diagnosis not present

## 2017-02-11 DIAGNOSIS — Z89022 Acquired absence of left finger(s): Secondary | ICD-10-CM | POA: Insufficient documentation

## 2017-02-11 DIAGNOSIS — F1414 Cocaine abuse with cocaine-induced mood disorder: Secondary | ICD-10-CM | POA: Insufficient documentation

## 2017-02-11 DIAGNOSIS — L97323 Non-pressure chronic ulcer of left ankle with necrosis of muscle: Secondary | ICD-10-CM | POA: Insufficient documentation

## 2017-02-11 DIAGNOSIS — I10 Essential (primary) hypertension: Secondary | ICD-10-CM | POA: Insufficient documentation

## 2017-02-11 DIAGNOSIS — M069 Rheumatoid arthritis, unspecified: Secondary | ICD-10-CM | POA: Diagnosis not present

## 2017-02-11 DIAGNOSIS — L98491 Non-pressure chronic ulcer of skin of other sites limited to breakdown of skin: Secondary | ICD-10-CM | POA: Insufficient documentation

## 2017-02-11 DIAGNOSIS — F17210 Nicotine dependence, cigarettes, uncomplicated: Secondary | ICD-10-CM | POA: Diagnosis not present

## 2017-02-11 DIAGNOSIS — L98498 Non-pressure chronic ulcer of skin of other sites with other specified severity: Secondary | ICD-10-CM | POA: Diagnosis not present

## 2017-02-11 DIAGNOSIS — L97213 Non-pressure chronic ulcer of right calf with necrosis of muscle: Secondary | ICD-10-CM | POA: Diagnosis not present

## 2017-02-11 DIAGNOSIS — L03114 Cellulitis of left upper limb: Secondary | ICD-10-CM | POA: Insufficient documentation

## 2017-02-11 LAB — CULTURE, BLOOD (ROUTINE X 2)
Culture: NO GROWTH
Culture: NO GROWTH
SPECIAL REQUESTS: ADEQUATE
Special Requests: ADEQUATE

## 2017-02-11 NOTE — Telephone Encounter (Signed)
Patient was discharged from hospital on 02/08/17. She was given 20 tablets of Oxycodone to take every 6 hours as needed (5 day supply). She is requesting a refill too early. She has been dismissed from the [facility] and has an appointment to establish care with the [facility] on 02/12/17 at which time her pain needs can be assessed.

## 2017-02-12 ENCOUNTER — Ambulatory Visit: Payer: Self-pay | Admitting: Family Medicine

## 2017-02-18 DIAGNOSIS — L97323 Non-pressure chronic ulcer of left ankle with necrosis of muscle: Secondary | ICD-10-CM | POA: Diagnosis not present

## 2017-02-18 IMAGING — CR DG FOOT COMPLETE 3+V*L*
3 series · 3 of 3 positions shown · non-contrast
Comparison: LEFT foot radiograph January 30, 2010

CLINICAL DATA: LEFT foot pain and wound.  History of vasculitis.

EXAM:
LEFT FOOT - COMPLETE 3+ VIEW

[foot ap]
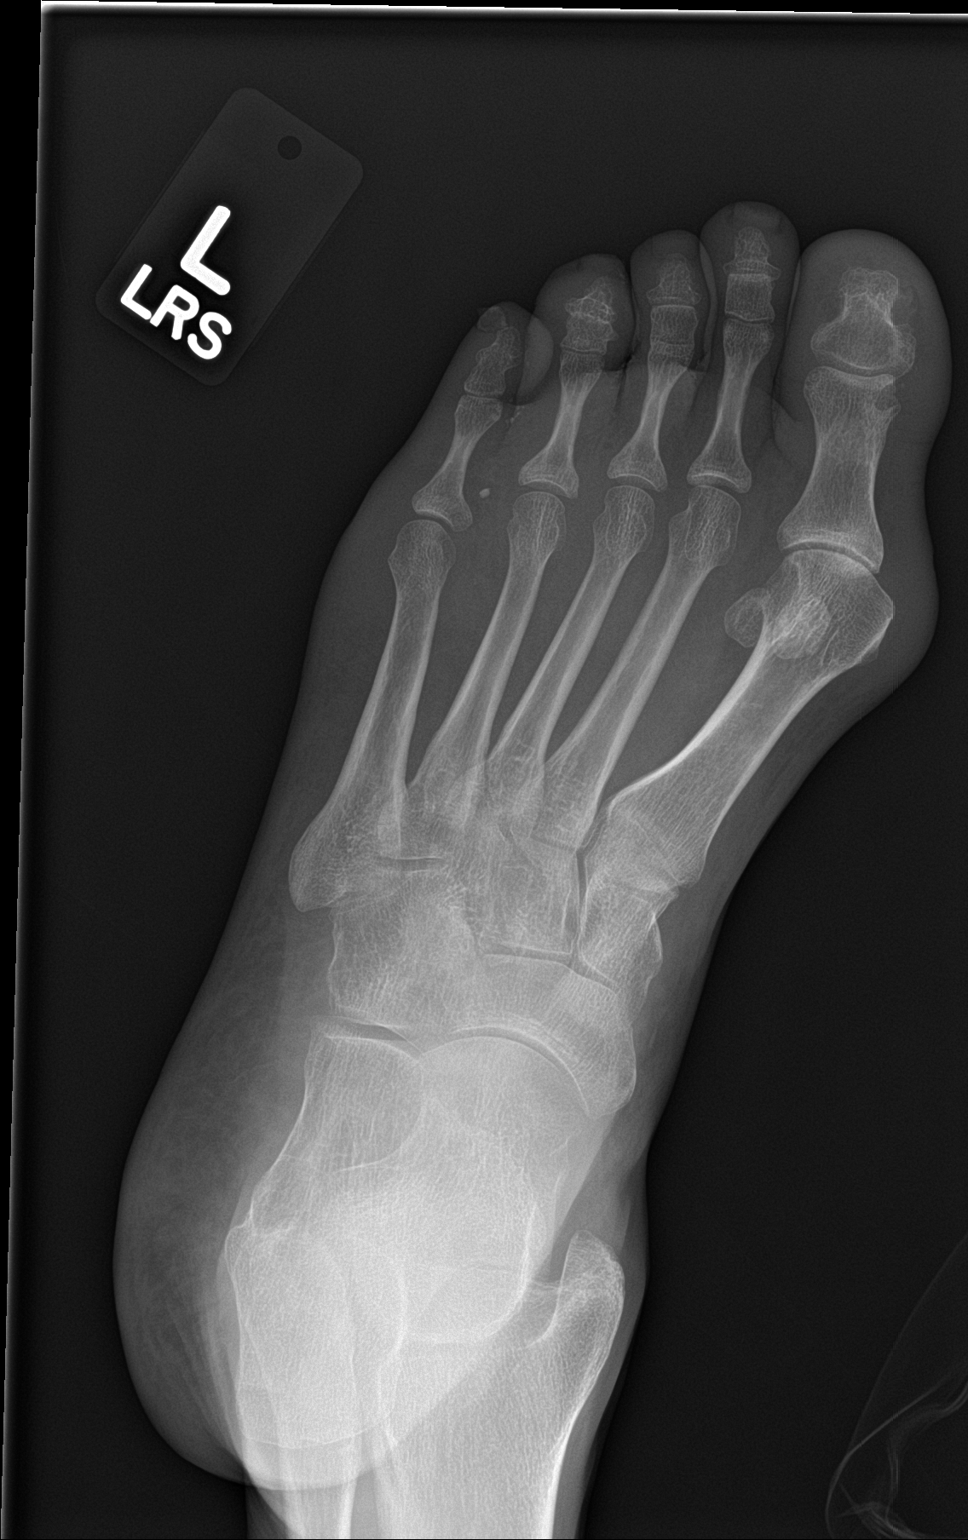

[foot obl]
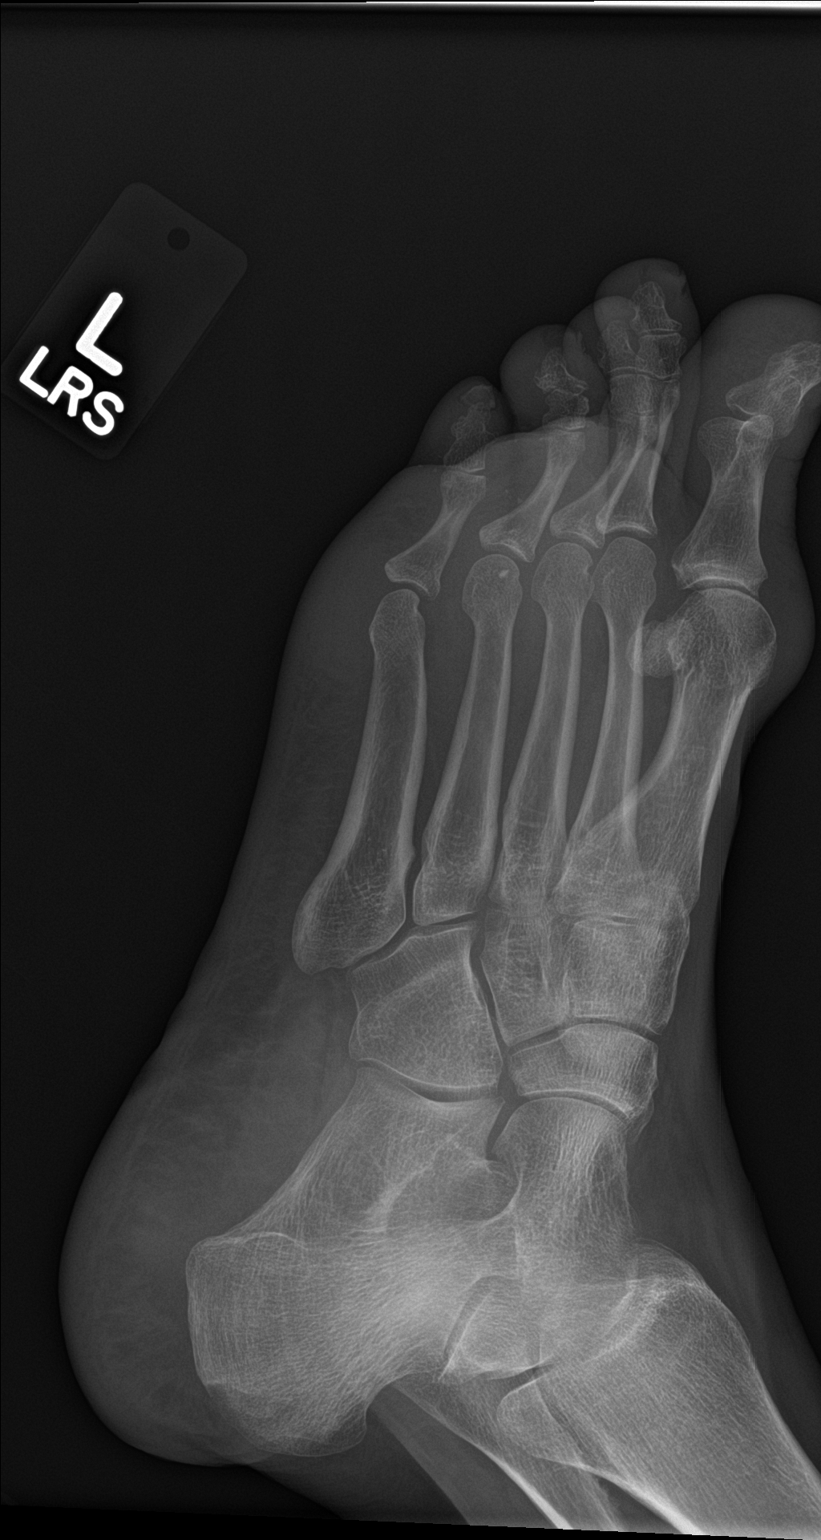

[foot lat]
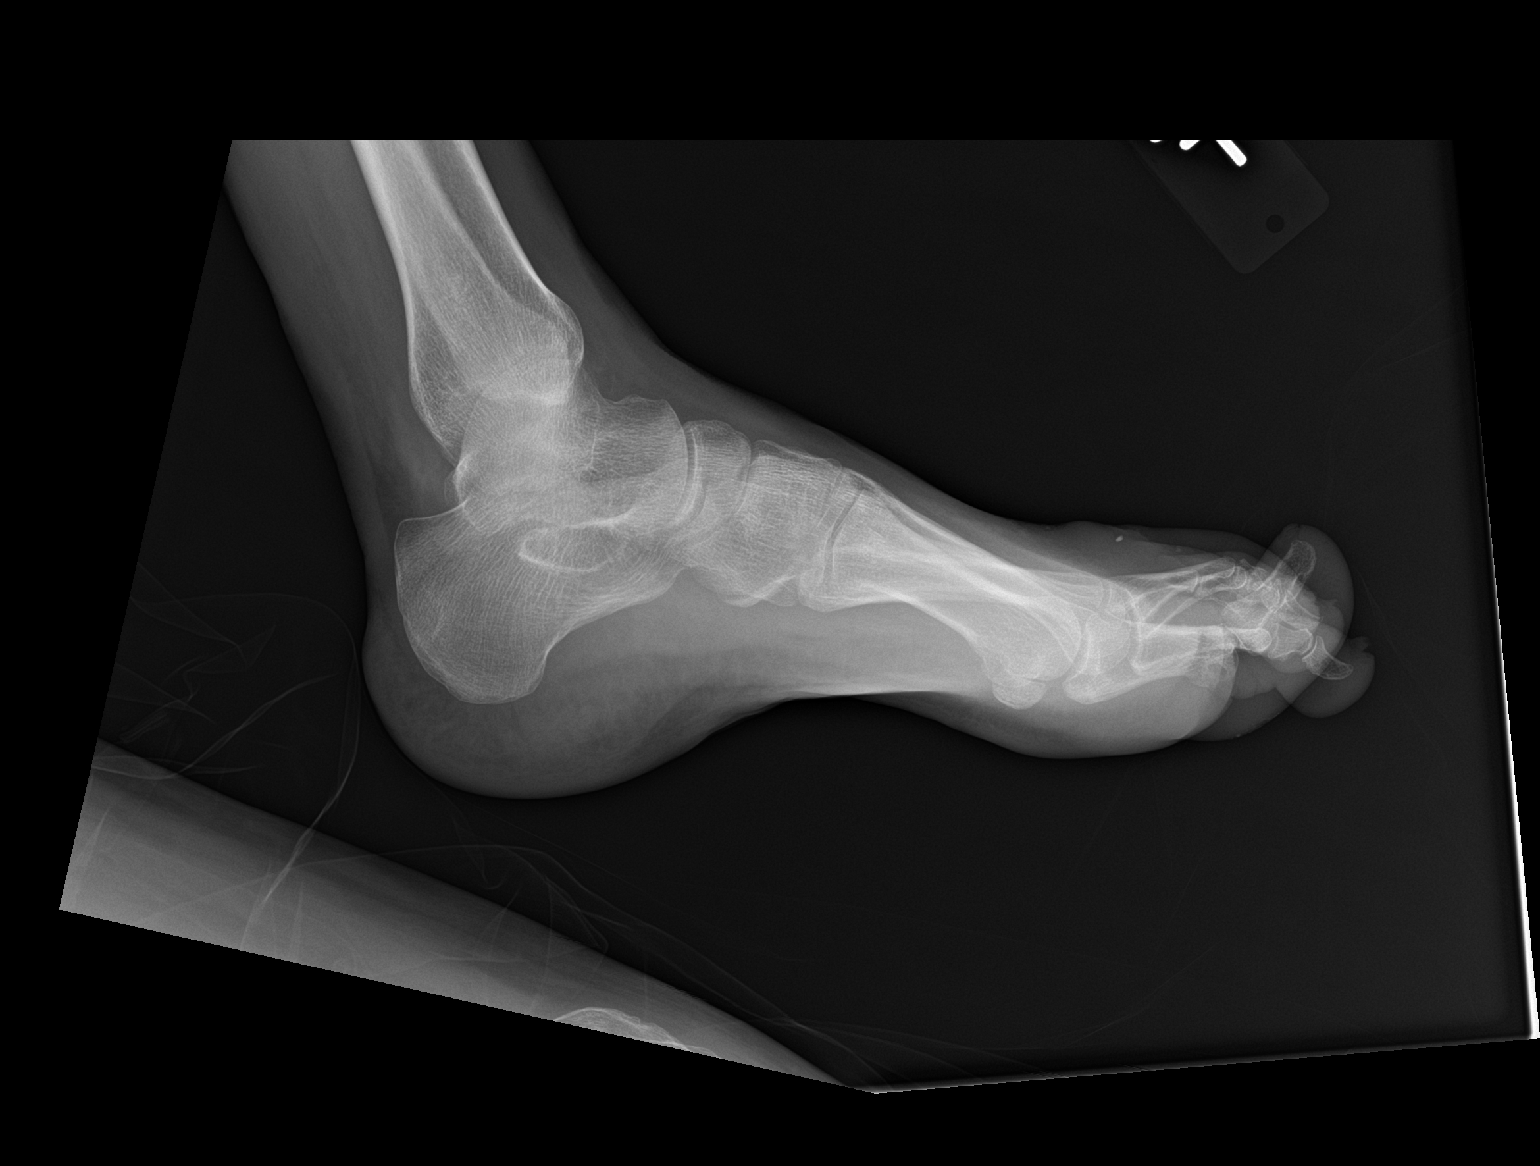

[3 of 3 positions shown; findings below may reference images not displayed]

FINDINGS: No acute fracture deformity. No dislocation. Moderate hallux valgus
deformity appears mildly advanced from prior examination with mild
first metatarsophalangeal osteoarthrosis. No destructive bony
lesions. Debris projecting in the dorsum of the forefoot with soft
tissue swelling. No subcutaneous gas.
IMPRESSION: Dorsal forefoot soft tissue swelling with debris, which could be
external to the patient, recommend direct inspection.

No acute fracture deformity or dislocation.

## 2017-02-20 IMAGING — MR MR FOOT*L* W/O CM
4 of 5 series · 24 of 40 positions shown · non-contrast
Comparison: Radiograph 03/09/2015

CLINICAL DATA: Diabetic foot ulcers.

EXAM:
MRI OF THE LEFT FOREFOOT WITHOUT CONTRAST
TECHNIQUE: Multiplanar, multisequence MR imaging was performed. No intravenous
contrast was administered.

[Series 3: T1 · coronal · 4.0mm · 0.29mm/px · 8 of 31 slices shown (1 of 2)]
[im 1/31]
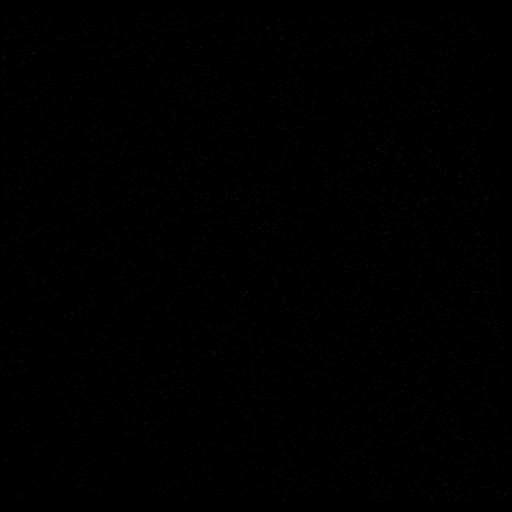
[im 4/31]
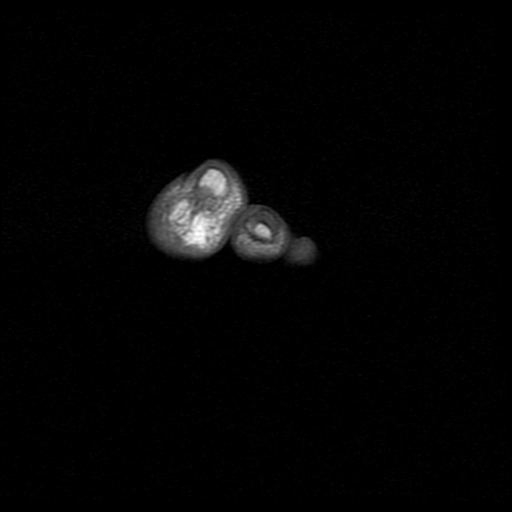
[im 11/31]
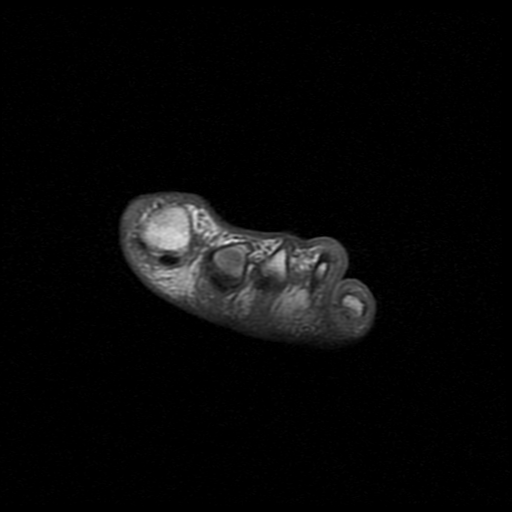
[im 14/31]
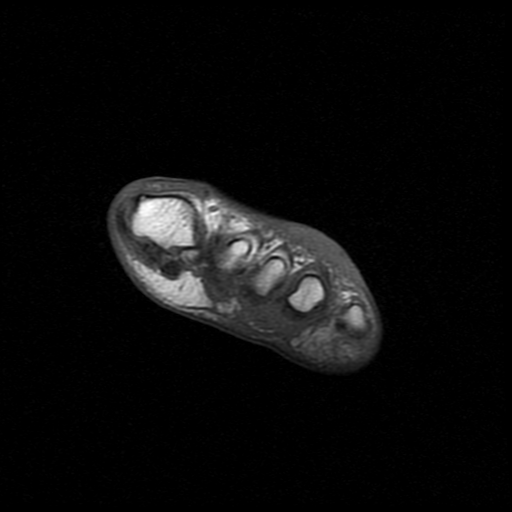
[im 17/31]
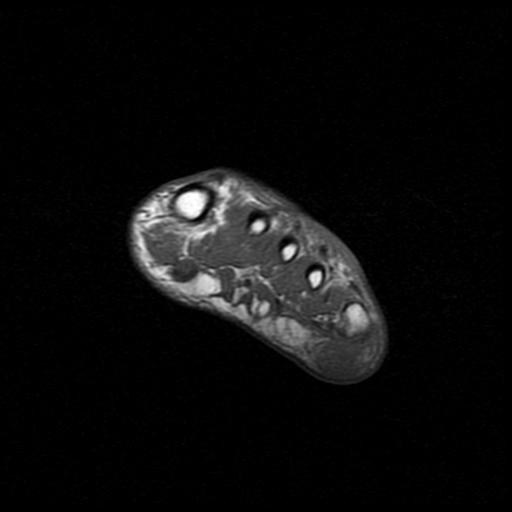
[im 21/31]
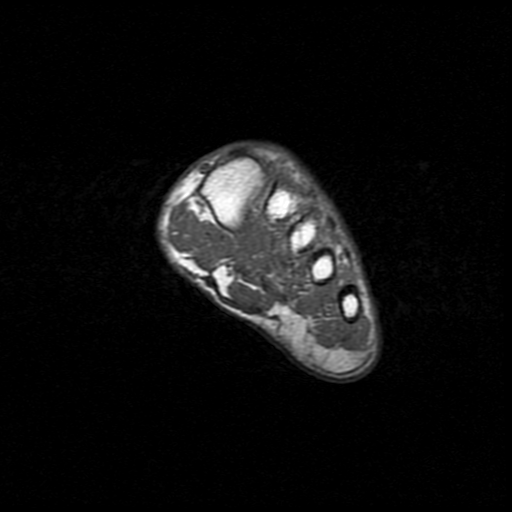
[im 27/31]
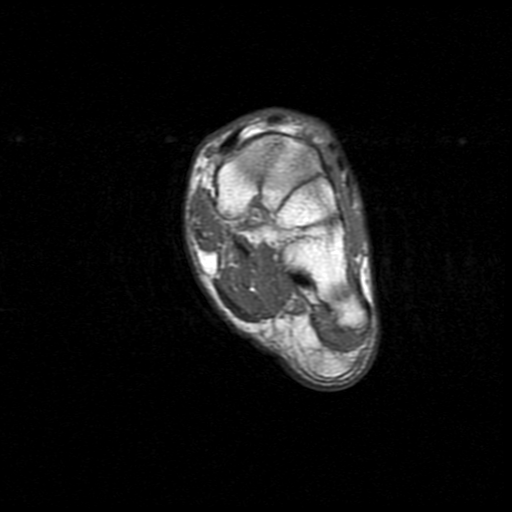
[im 31/31]
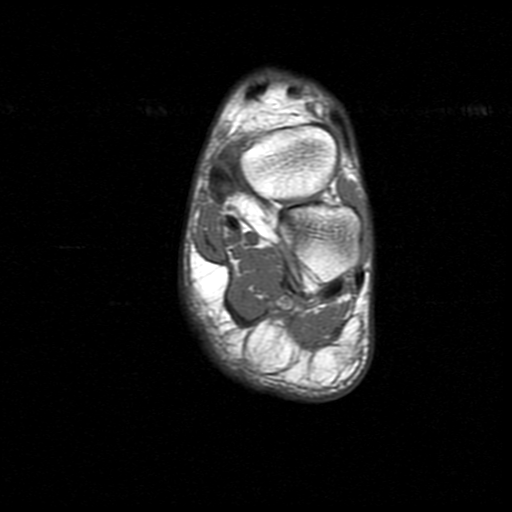

[Series 4: T2 · coronal · 4.0mm · 0.29mm/px · 7 of 31 slices shown]
[im 1/31]
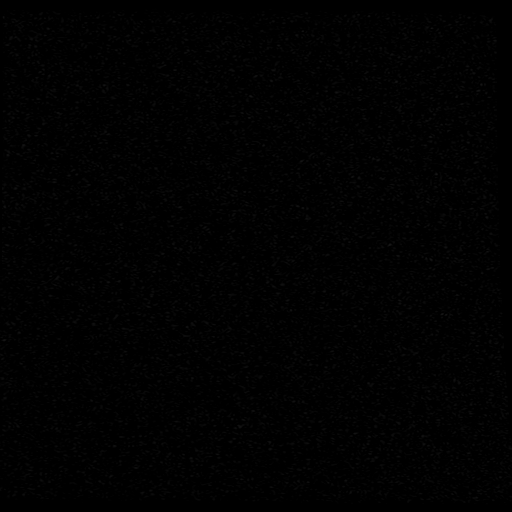
[im 4/31]
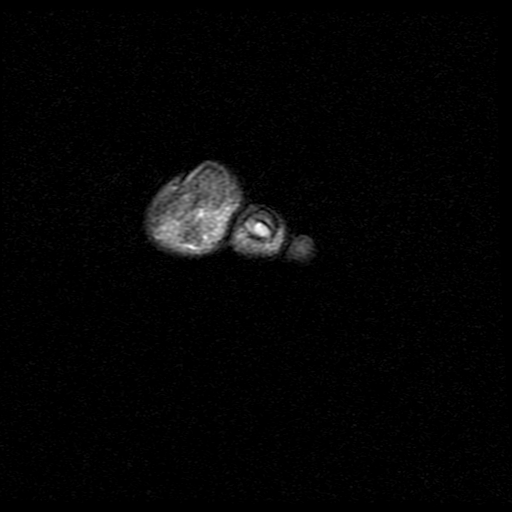
[im 7/31]
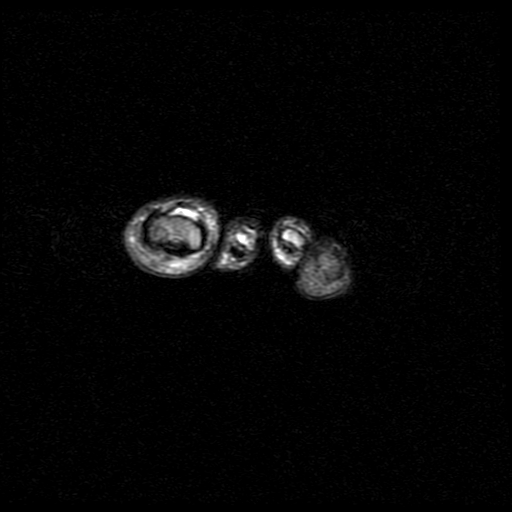
[im 10/31]
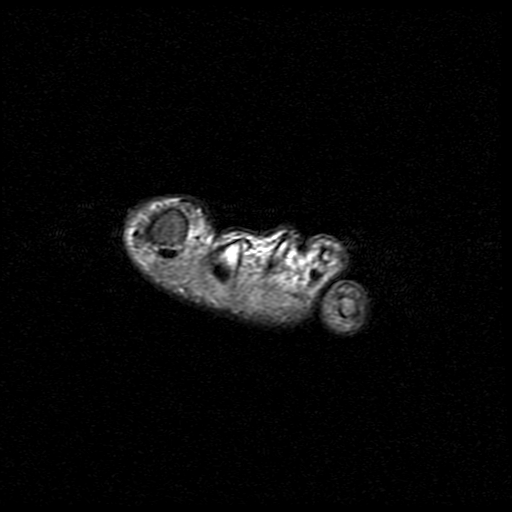
[im 13/31]
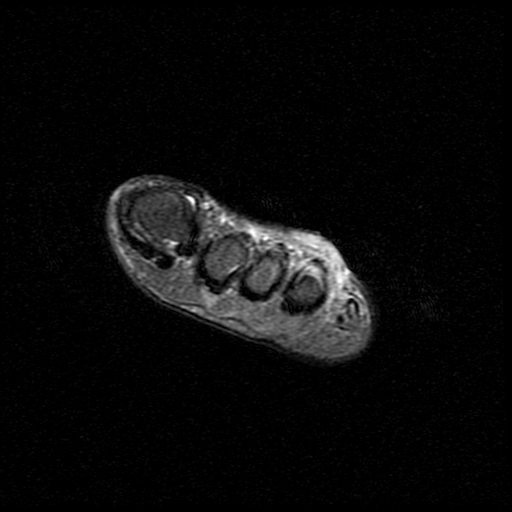
[im 16/31]
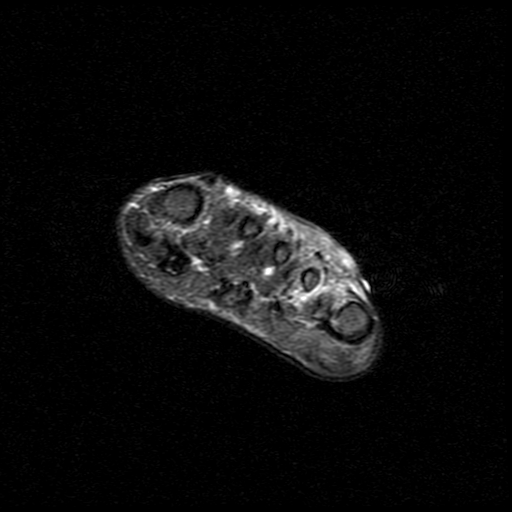
[im 28/31]
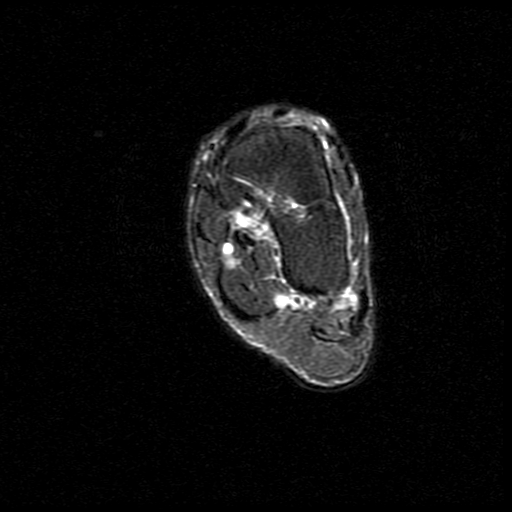

[Series 5: T1 · oblique · 4.0mm · 0.62mm/px · 6 of 16 slices shown (2 of 2)]
[im 1/16]
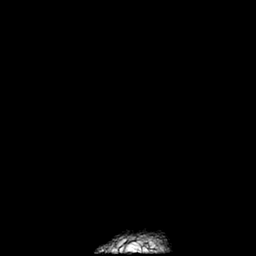
[im 4/16]
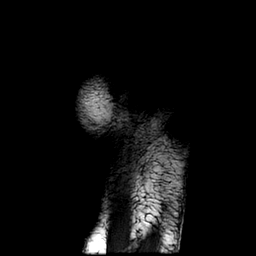
[im 7/16]
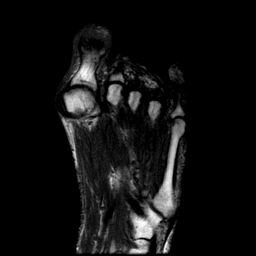
[im 10/16]
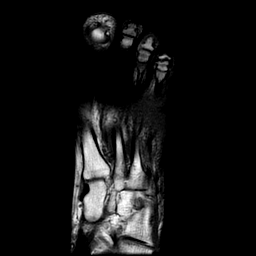
[im 13/16]
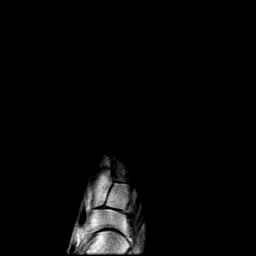
[im 16/16]
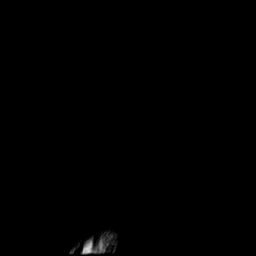

[Series 6: STIR · oblique · 4.0mm · 0.31mm/px · 3 of 16 slices shown]
[im 4/16]
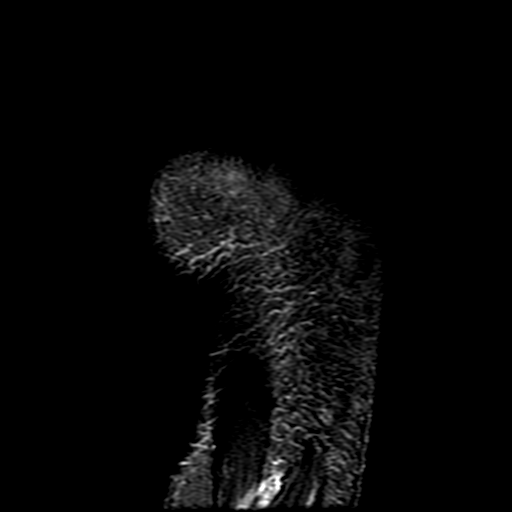
[im 10/16]
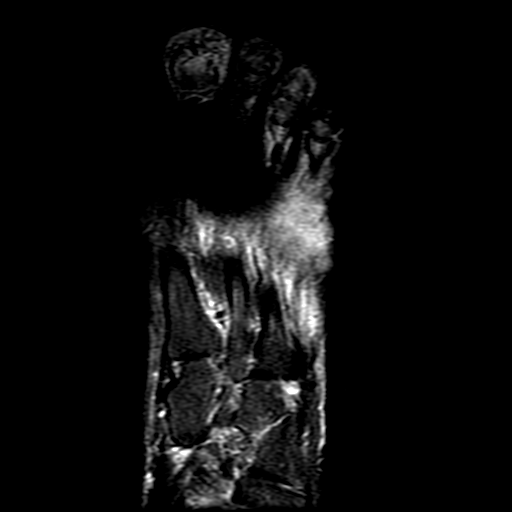
[im 16/16]
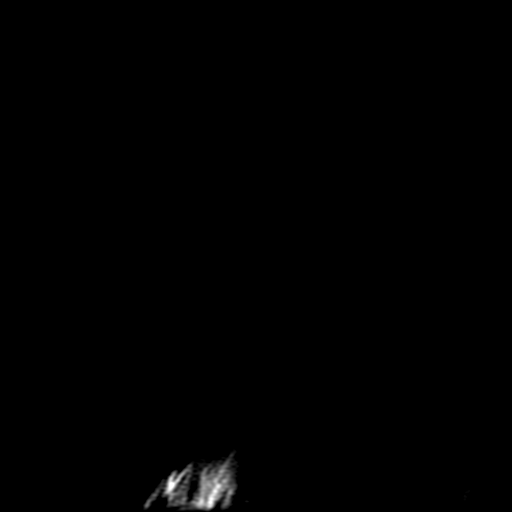

[24 of 40 positions shown; findings below may reference images not displayed]

FINDINGS: There are mild degenerative changes involving the forefoot most
notably at the first metatarsal phalangeal joint with a mild hallux
valgus deformity. No findings suspicious for osteomyelitis or septic
arthritis. Mild cellulitis and myositis. No focal soft tissue
abscess or findings for pyomyositis. The lesion involving the
proximal phalanx of the big toe is more likely under erosion and
could be secondary to gout.
IMPRESSION: No MR findings suspicious for septic arthritis or osteomyelitis.

Mild cellulitis and myositis but no focal drainable soft tissue
abscess.

## 2017-02-20 IMAGING — CR DG FOOT COMPLETE 3+V*L*
3 series · 3 of 3 positions shown · non-contrast
Comparison: Three views of the left foot 03/07/2015.

CLINICAL DATA: Infection of the left foot.

EXAM:
LEFT FOOT - COMPLETE 3+ VIEW

[x foot ap left]
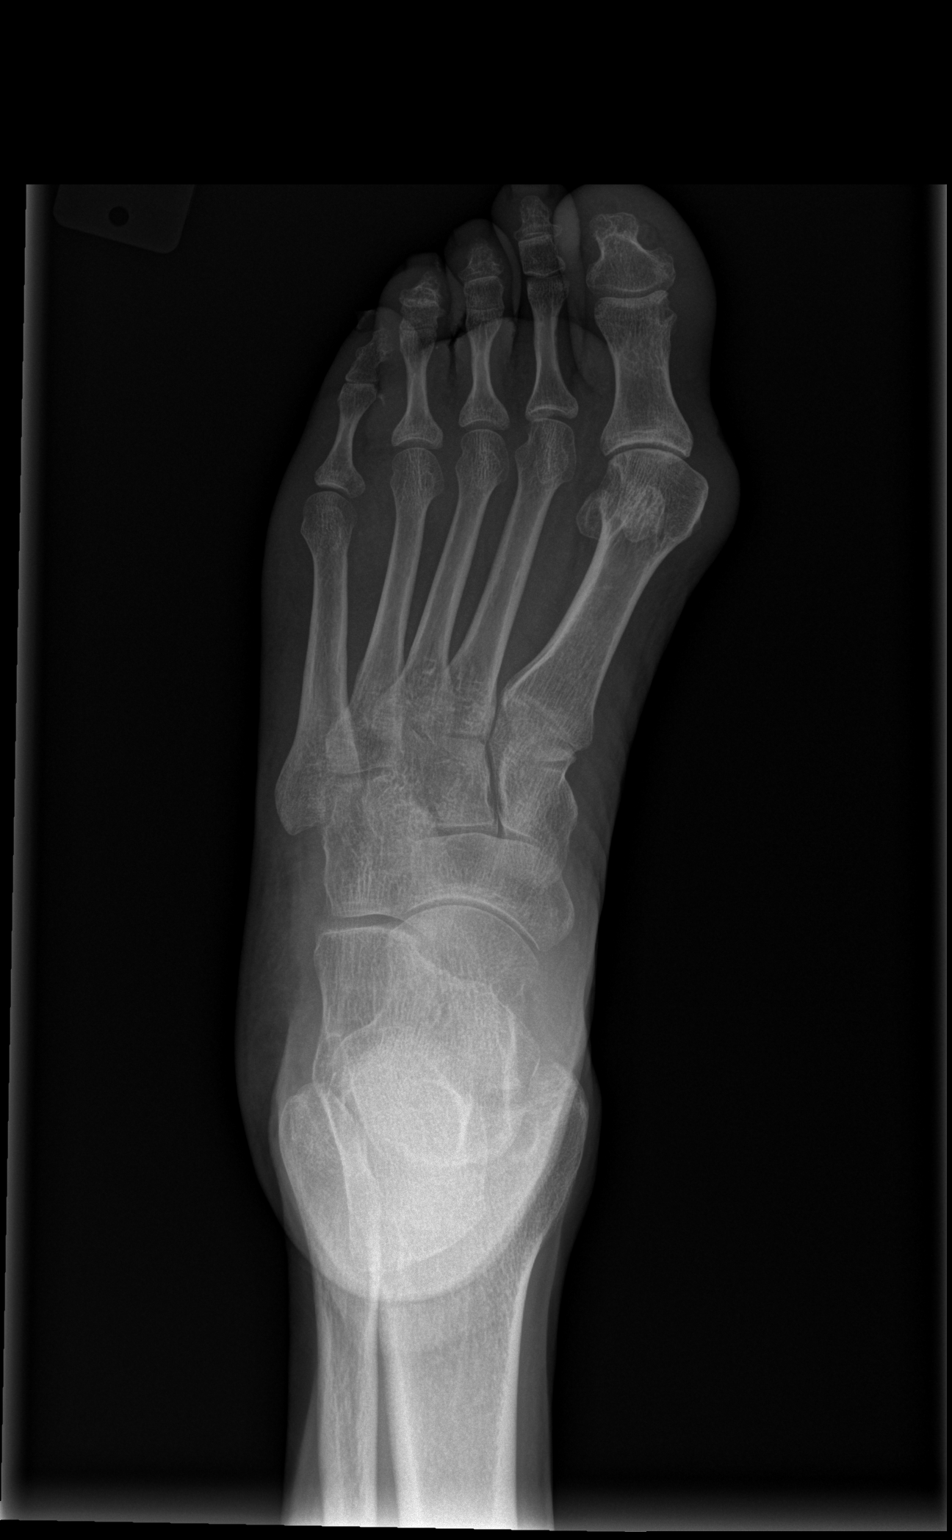

[x foot obl left]
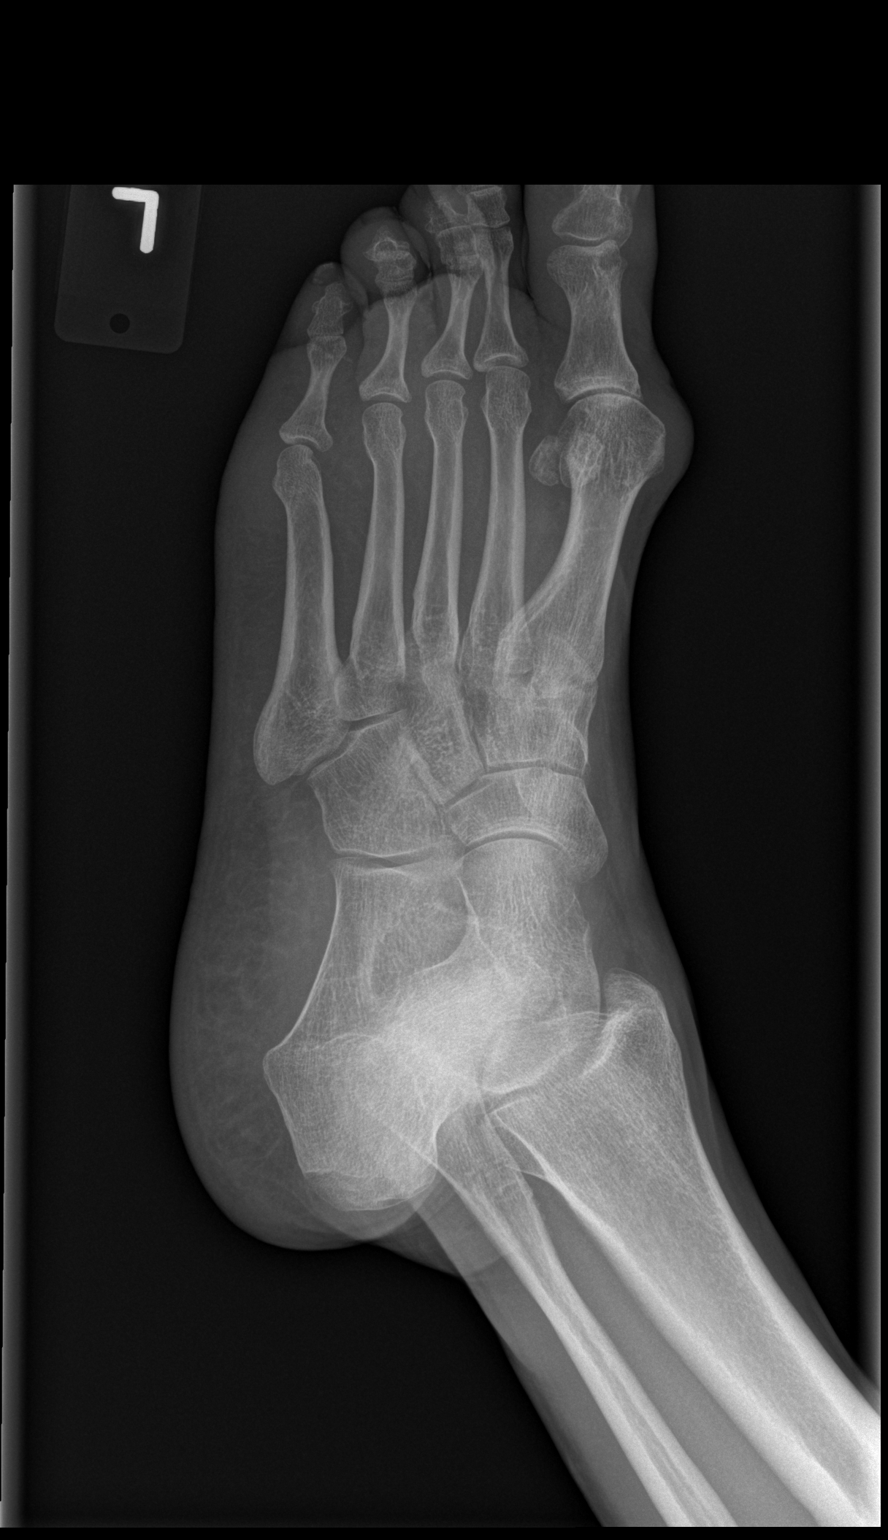

[x foot lat left]
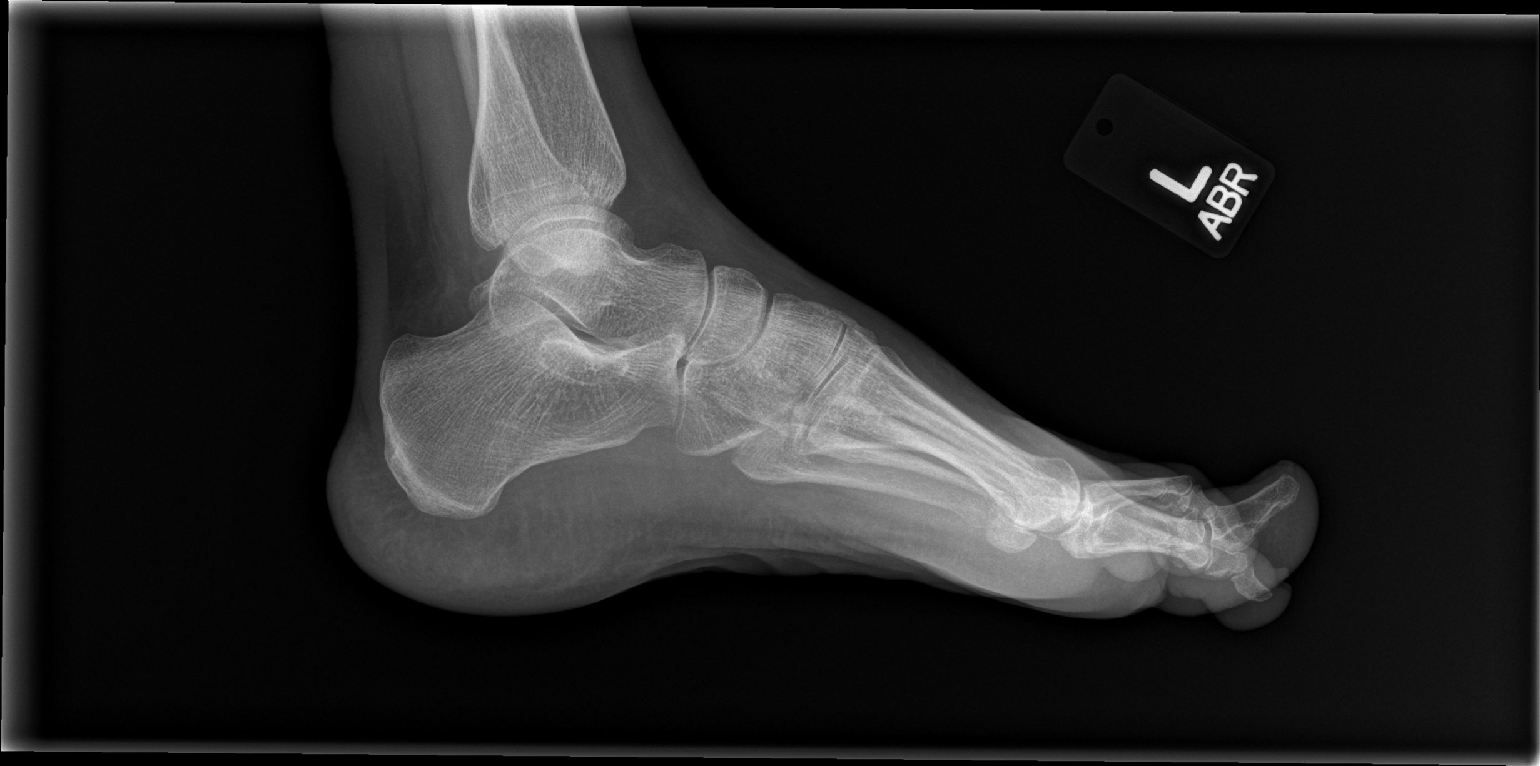

[3 of 3 positions shown; findings below may reference images not displayed]

FINDINGS: Soft tissue swelling over the great toe is again noted. And erosion
in the distal aspect of the proximal phalanx in the great toe may be
related to osteomyelitis. Soft tissue swelling is seen over the
fourth and fifth digits is well without other osseous lesions.
Remainder the foot is unremarkable.
IMPRESSION: 1. Persistent soft tissue swelling most pronounced in the great toe,
fourth and fifth digits.
2. Erosion the distal aspect of the proximal phalanx may be related
to osteomyelitis.

## 2017-02-25 ENCOUNTER — Ambulatory Visit: Payer: Self-pay | Admitting: Family Medicine

## 2017-03-22 ENCOUNTER — Emergency Department (HOSPITAL_COMMUNITY): Payer: Medicaid Other

## 2017-03-22 ENCOUNTER — Encounter (HOSPITAL_COMMUNITY): Payer: Self-pay | Admitting: Emergency Medicine

## 2017-03-22 ENCOUNTER — Emergency Department (HOSPITAL_COMMUNITY)
Admission: EM | Admit: 2017-03-22 | Discharge: 2017-03-22 | Disposition: A | Discharge status: Home / Self Care | Payer: Medicaid Other | Attending: Emergency Medicine | Admitting: Emergency Medicine

## 2017-03-22 DIAGNOSIS — I10 Essential (primary) hypertension: Secondary | ICD-10-CM | POA: Insufficient documentation

## 2017-03-22 DIAGNOSIS — Z79899 Other long term (current) drug therapy: Secondary | ICD-10-CM | POA: Insufficient documentation

## 2017-03-22 DIAGNOSIS — L97319 Non-pressure chronic ulcer of right ankle with unspecified severity: Secondary | ICD-10-CM | POA: Diagnosis not present

## 2017-03-22 DIAGNOSIS — F17210 Nicotine dependence, cigarettes, uncomplicated: Secondary | ICD-10-CM | POA: Diagnosis not present

## 2017-03-22 DIAGNOSIS — L97329 Non-pressure chronic ulcer of left ankle with unspecified severity: Secondary | ICD-10-CM | POA: Insufficient documentation

## 2017-03-22 IMAGING — CR DG CHEST 1V PORT
1 series · 1 of 1 positions shown · non-contrast
Comparison: 10/09/2014 and earlier.

CLINICAL DATA: Acute onset of left-sided chest pain yesterday.
Current history of hypertension. Nonsmoker.

EXAM:
PORTABLE CHEST - 1 VIEW

[AP]
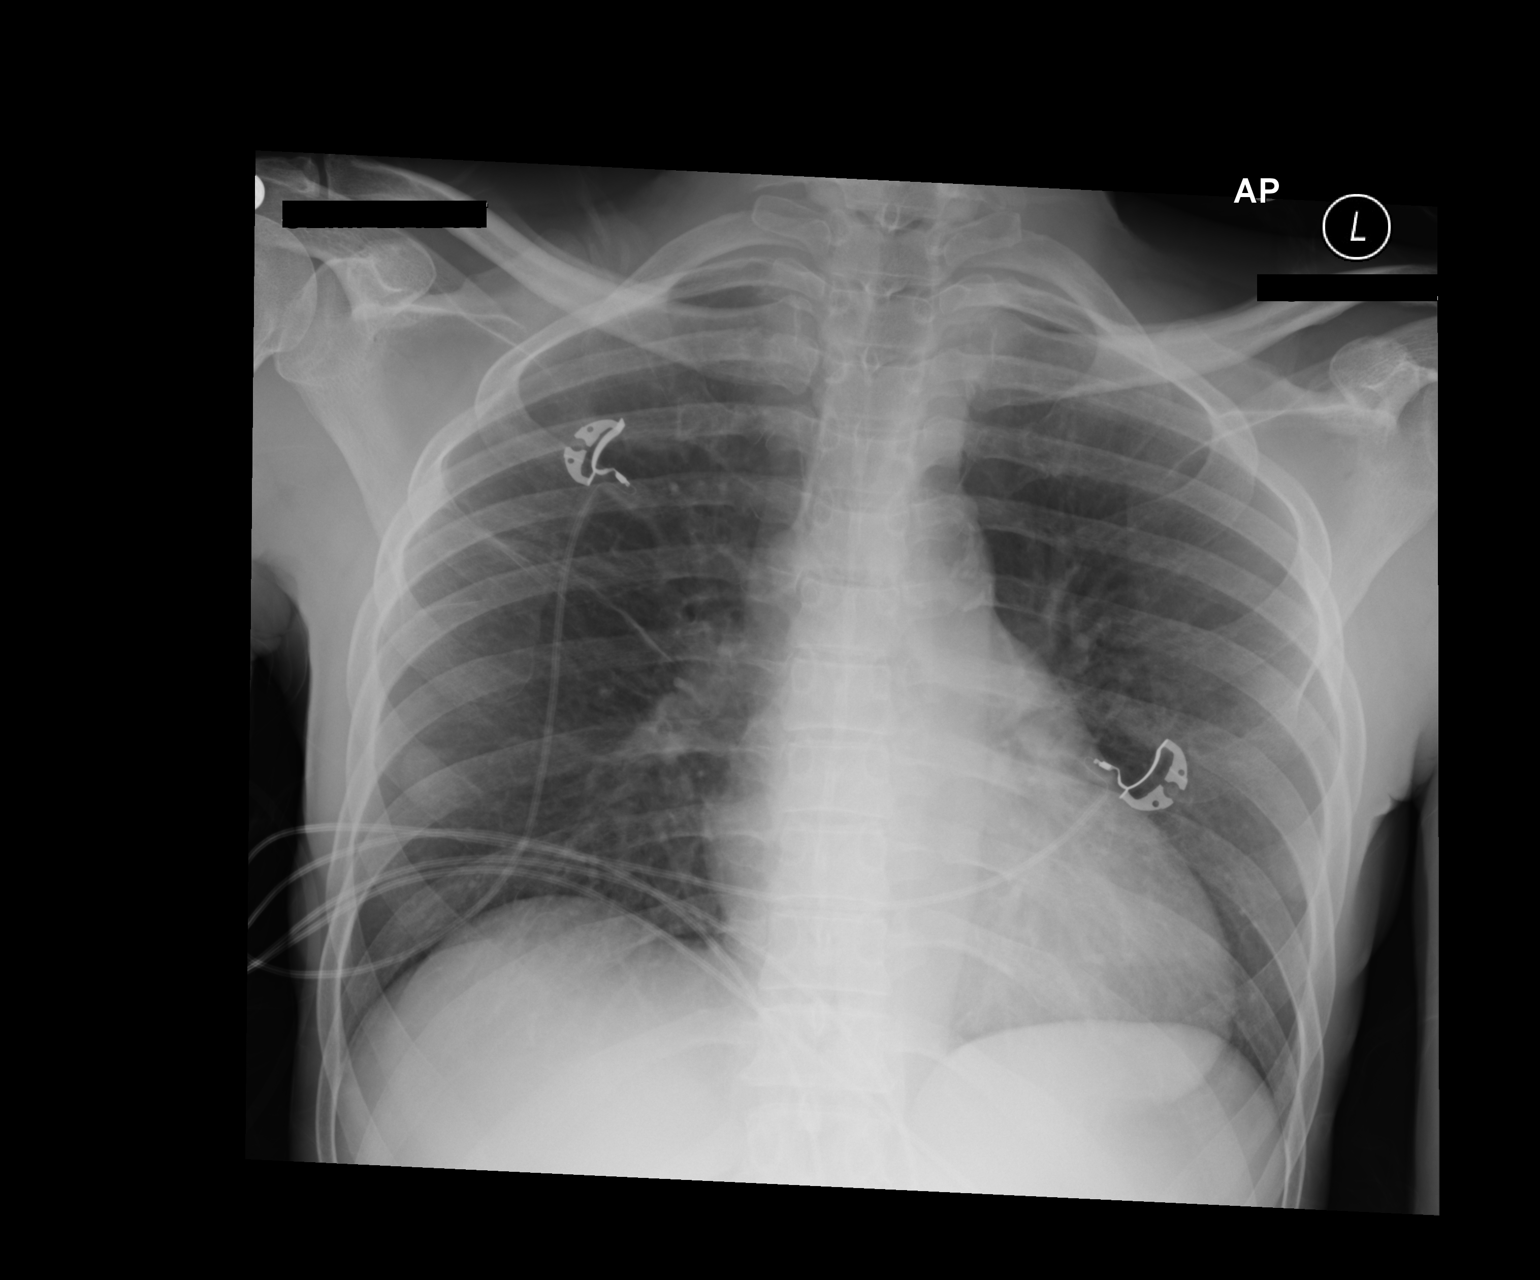

[1 of 1 positions shown; findings below may reference images not displayed]

FINDINGS: Cardiac silhouette upper normal in size for AP portable technique,
unchanged. Thoracic aorta mildly atherosclerotic, unchanged. Hilar
and mediastinal contours otherwise unremarkable. Linear scarring in
the right upper lobe, unchanged. Lungs otherwise clear. No localized
airspace consolidation. No pleural effusions. No pneumothorax.
Normal pulmonary vascularity.
IMPRESSION: Stable scarring in the right upper lobe. No acute cardiopulmonary
disease.

## 2017-03-22 MED ORDER — MELOXICAM 7.5 MG PO TABS
15.0000 mg | ORAL_TABLET | Freq: Once | ORAL | Status: AC
  Administered 2017-03-22: 15 mg via ORAL
  Filled 2017-03-22: qty 2

## 2017-03-22 NOTE — ED Triage Notes (Signed)
EMS picked pt up from home w/ bilateral foot pain, pt was able to walk to the ambulance although the pain was an 8.  She missed her last wound care appointment.  174/100, HB95, RR20, 100% RA.

## 2017-03-22 NOTE — ED Notes (Signed)
Pt. Given bus pass and snack and rolled to bus stop by RN in wheelchair.

## 2017-03-22 NOTE — ED Provider Notes (Signed)
[facility] DEPT Provider Note   CSN: [id number] Arrival date & time: 03/22/17  0540     History   Chief Complaint Chief Complaint  Patient presents with  . bilateral foot wound pain    HPI [name] is a 56 y.o. female who presents with bilateral foot pain. 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 She was dismissed by the [facility] and has refused SNF in the past. She was referred to the S[facility] cell clinic but hasn't followed up. Ortho has recommended amputation but this cannot be done until she stops cocaine use. She was last admitted from 5/5-5/8 for possible osteomyelitis which was ruled out. She states that recently her wounds have been causing her more pain and is worried they are infected. She has gone to the wound care center once since being discharged but missed her last appointment. No known fever but has had vomiting.  HPI  Past Medical History:  Diagnosis Date  . CAP (community acquired pneumonia) 03/2014 X 2  . Cocaine abuse    ongoing with resultant vaculitis.  . Depression   . Headache    "weekly" (07/29/2016)  . Hypertension   . Inflammatory arthritis   . Migraines    "probably 5-6/yr" (07/29/2016)  . Normocytic anemia    BL Hgb 9.8-12. Last anemia panel 04/2010 - showing Fe 19, ferritin 101.  Pt on monthly B12 injections  . Rheumatoid arthritis(714.0)    patient reported  . 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 Followed by Dr. Louanne Skye    Patient Active Problem List   Diagnosis Date Noted  . AKI (acute kidney injury) ([place]) 02/07/2017  . Acute kidney injury ([place]) 02/06/2017  . Wound healing, delayed   . Osteomyelitis ([place])   . Ulcer with gangrene, with necrosis of muscle ([place])   . Skin ulcer of lower leg with necrosis of muscle, unspecified laterality ([place]) 06/04/2015  .  Protein-calorie malnutrition, severe ([place]) 01/15/2015  . MDD (major depressive disorder), recurrent episode, severe ([place]) 10/16/2014  . Cocaine use disorder, severe, dependence ([place]) 10/10/2014  . 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 Essential hypertension 02/26/2010    Past Surgical History:  Procedure Laterality Date  . AMPUTATION Left 05/22/2016   Procedure: AMPUTATION LEFT LONG FINGER;  Surgeon: Marybelle Killings, MD;  Location: [facility];  Service: Orthopedics;  Laterality: Left;  . HERNIA REPAIR     "stomach"  . I&D EXTREMITY Right 09/26/2015   Procedure: IRRIGATION AND DEBRIDEMENT LEG WOUND  VAC PLACEMENT.;  Surgeon: Loel Lofty Dillingham, DO;  Location: [facility];  Service: Plastics;  Laterality: Right;  . INCISION AND DRAINAGE OF WOUND Bilateral 10/20/2016   Procedure: IRRIGATION AND DEBRIDEMENT WOUND BILATERAL;  Surgeon: Edrick Kins, DPM;  Location: [facility];  Service: Podiatry;  Laterality: Bilateral;  . IRRIGATION AND DEBRIDEMENT ABSCESS Bilateral 09/26/2013   Procedure: DEBRIDEMENT ULCERS BILATERAL THIGHS;  Surgeon: Gwenyth Ober, MD;  Location: [facility];  Service: General;  Laterality: Bilateral;  . SKIN BIOPSY Bilateral    shin nodules    OB History    No data available       Home Medications    Prior to Admission medications   Medication Sig Start Date End Date Taking? Authorizing Provider  collagenase (SANTYL) ointment Apply topically daily. Patient not taking: Reported on 02/01/2017 10/23/16   Minus Liberty,  MD  feeding supplement (BOOST / RESOURCE BREEZE) LIQD Take 1 Container by mouth 3 (three) times daily between meals. Patient not taking: Reported on 02/01/2017 10/23/16   Minus Liberty, MD  ferrous sulfate 325 (65 FE) MG tablet Take 1 tablet (325 mg total) by mouth 2 (two) times daily with a meal. Patient not taking: Reported on 02/01/2017 12/28/16   Molt, Bethany, DO  ibuprofen (ADVIL,MOTRIN) 600 MG tablet Take 1 tablet (600 mg total) by mouth every 8  (eight) hours as needed for mild pain or moderate pain. 01/20/17   Milagros Loll, MD  Multiple Vitamins-Iron (MULTIVITAMINS WITH IRON) TABS tablet Take 1 tablet by mouth daily. Patient not taking: Reported on 02/01/2017 10/24/16   Minus Liberty, MD  mupirocin cream (BACTROBAN) 2 % Apply topically daily. 02/09/17   Shela Leff, MD  Olmesartan-Amlodipine-HCTZ (TRIBENZOR) 40-10-12.5 MG TABS Take 1 tablet by mouth daily. 02/08/17   Shela Leff, MD  Oxycodone HCl 10 MG TABS Take 1 tablet (10 mg total) by mouth every 6 (six) hours as needed (severe pain). 02/08/17   Shela Leff, MD  senna (SENOKOT) 8.6 MG TABS tablet Take 1 tablet (8.6 mg total) by mouth daily as needed for mild constipation. Patient not taking: Reported on 02/01/2017 10/23/16   Minus Liberty, MD    Family History Family History  Problem Relation Age of Onset  . Breast cancer Mother        Breast cancer  . Alcohol abuse Mother   . Colon cancer Maternal Aunt 67  . Alcohol abuse Father     Social History Social History  Substance Use Topics  . Smoking status: Current Every Day Smoker    Packs/day: 0.12    Years: 38.00    Types: Cigarettes  . Smokeless tobacco: Never Used     Comment: 2 A DAY  . Alcohol use No     Allergies   Acetaminophen and Other   Review of Systems Review of Systems  Constitutional: Negative for fever.  Gastrointestinal: Positive for vomiting. Negative for abdominal pain and nausea.  Musculoskeletal: Positive for arthralgias, gait problem and myalgias.  Skin: Positive for wound.  All other systems reviewed and are negative.    Physical Exam Updated Vital Signs BP (!) 143/114 (BP Location: Left Arm)   Pulse 91   Temp 98.5 F (36.9 C) (Oral)   Resp 20   SpO2 100%   Physical Exam  Constitutional: She is oriented to person, place, and time. She appears cachectic. She appears distressed (mildly).  Tearful  HENT:  Head: Normocephalic and atraumatic.  Eyes:  Conjunctivae are normal. Pupils are equal, round, and reactive to light. Right eye exhibits no discharge. Left eye exhibits no discharge. No scleral icterus.  Neck: Normal range of motion.  Cardiovascular: Normal rate and regular rhythm.  Exam reveals no gallop and no friction rub.   No murmur heard. Pulmonary/Chest: Effort normal and breath sounds normal. No respiratory distress. She has no wheezes. She has no rales. She exhibits no tenderness.  Abdominal: She exhibits no distension.  Musculoskeletal:  Right leg: Chronic wound of posterior calf. See below picture for details  Left leg: Chronic wound of posterior ankle. See below picture for details.  Neurological: She is alert and oriented to person, place, and time.  Skin: Skin is warm and dry.  Chronic   Psychiatric: She has a normal mood and affect. Her behavior is normal.  Nursing note and vitals reviewed.        ED  Treatments / Results  Labs (all labs ordered are listed, but only abnormal results are displayed) Labs Reviewed - No data to display  EKG  EKG Interpretation None       Radiology No results found.  Procedures Procedures (including critical care time)  Medications Ordered in ED Medications - No data to display   Initial Impression / Assessment and Plan / ED Course  I have reviewed the triage vital signs and the nursing notes.  Pertinent labs & imaging results that were available during my care of the patient were reviewed by me and considered in my medical decision making (see chart for details).  56 year old female presents with worsening pain of her chronic leg ulcers. Xray of left leg is unremarkable. Xray of right shows possible small periosteal reaction of distal fibula. Meloxicam given for pain. Discussed with Dr. Christy Gentles. Vital signs are normal. Wound actually appears better than it has in the past. Wound care was given in the ED and she was advised to f/u with Wound care clinic. Return  precautions given.  Final Clinical Impressions(s) / ED Diagnoses   Final diagnoses:  Chronic ulcer of ankle, left, with unspecified severity ([place])  Chronic ulcer of right ankle, unspecified ulcer stage [facility])    New Prescriptions New Prescriptions   No medications on file     Iris Pert 03/22/17 2106    Ripley Fraise, MD 03/22/17 2306

## 2017-03-22 NOTE — ED Notes (Signed)
Requested meds from pharmacy

## 2017-03-22 NOTE — Discharge Instructions (Signed)
Please follow up with wound center for ongoing care

## 2017-03-22 NOTE — ED Notes (Signed)
Pt states her legs hurt all the time. Pt has wounds on her legs and believes they are infected again.

## 2017-03-30 ENCOUNTER — Emergency Department (HOSPITAL_COMMUNITY): Payer: Medicaid Other

## 2017-03-30 ENCOUNTER — Inpatient Hospital Stay (HOSPITAL_COMMUNITY)
Admission: EM | Admit: 2017-03-30 | Discharge: 2017-04-01 | DRG: 299 | Disposition: A | Discharge status: Home / Self Care | Payer: Medicaid Other | Attending: Family Medicine | Admitting: Family Medicine

## 2017-03-30 ENCOUNTER — Encounter (HOSPITAL_COMMUNITY): Payer: Self-pay | Admitting: *Deleted

## 2017-03-30 DIAGNOSIS — T148XXA Other injury of unspecified body region, initial encounter: Secondary | ICD-10-CM

## 2017-03-30 DIAGNOSIS — L089 Local infection of the skin and subcutaneous tissue, unspecified: Secondary | ICD-10-CM | POA: Diagnosis not present

## 2017-03-30 DIAGNOSIS — I999 Unspecified disorder of circulatory system: Secondary | ICD-10-CM | POA: Diagnosis not present

## 2017-03-30 DIAGNOSIS — Z888 Allergy status to other drugs, medicaments and biological substances status: Secondary | ICD-10-CM

## 2017-03-30 DIAGNOSIS — F332 Major depressive disorder, recurrent severe without psychotic features: Secondary | ICD-10-CM | POA: Diagnosis present

## 2017-03-30 DIAGNOSIS — I10 Essential (primary) hypertension: Secondary | ICD-10-CM | POA: Diagnosis present

## 2017-03-30 DIAGNOSIS — N179 Acute kidney failure, unspecified: Secondary | ICD-10-CM | POA: Diagnosis present

## 2017-03-30 DIAGNOSIS — M79605 Pain in left leg: Secondary | ICD-10-CM

## 2017-03-30 DIAGNOSIS — Z886 Allergy status to analgesic agent status: Secondary | ICD-10-CM

## 2017-03-30 DIAGNOSIS — I96 Gangrene, not elsewhere classified: Principal | ICD-10-CM

## 2017-03-30 DIAGNOSIS — R51 Headache: Secondary | ICD-10-CM | POA: Diagnosis present

## 2017-03-30 DIAGNOSIS — F17210 Nicotine dependence, cigarettes, uncomplicated: Secondary | ICD-10-CM | POA: Diagnosis present

## 2017-03-30 DIAGNOSIS — F14988 Cocaine use, unspecified with other cocaine-induced disorder: Secondary | ICD-10-CM | POA: Diagnosis present

## 2017-03-30 DIAGNOSIS — L03119 Cellulitis of unspecified part of limb: Secondary | ICD-10-CM | POA: Diagnosis present

## 2017-03-30 DIAGNOSIS — Z89022 Acquired absence of left finger(s): Secondary | ICD-10-CM

## 2017-03-30 DIAGNOSIS — M069 Rheumatoid arthritis, unspecified: Secondary | ICD-10-CM | POA: Diagnosis present

## 2017-03-30 DIAGNOSIS — F1420 Cocaine dependence, uncomplicated: Secondary | ICD-10-CM | POA: Diagnosis present

## 2017-03-30 DIAGNOSIS — F14288 Cocaine dependence with other cocaine-induced disorder: Secondary | ICD-10-CM | POA: Diagnosis present

## 2017-03-30 DIAGNOSIS — M79604 Pain in right leg: Secondary | ICD-10-CM

## 2017-03-30 DIAGNOSIS — I776 Arteritis, unspecified: Secondary | ICD-10-CM | POA: Diagnosis present

## 2017-03-30 DIAGNOSIS — Z9119 Patient's noncompliance with other medical treatment and regimen: Secondary | ICD-10-CM

## 2017-03-30 DIAGNOSIS — E43 Unspecified severe protein-calorie malnutrition: Secondary | ICD-10-CM | POA: Diagnosis not present

## 2017-03-30 DIAGNOSIS — L98493 Non-pressure chronic ulcer of skin of other sites with necrosis of muscle: Secondary | ICD-10-CM | POA: Diagnosis not present

## 2017-03-30 DIAGNOSIS — Z681 Body mass index (BMI) 19 or less, adult: Secondary | ICD-10-CM

## 2017-03-30 LAB — CBC WITH DIFFERENTIAL/PLATELET
Basophils Absolute: 0 10*3/uL (ref 0.0–0.1)
Basophils Relative: 0 %
EOS ABS: 0.2 10*3/uL (ref 0.0–0.7)
Eosinophils Relative: 2 %
HEMATOCRIT: 33.2 % — AB (ref 36.0–46.0)
HEMOGLOBIN: 10.8 g/dL — AB (ref 12.0–15.0)
LYMPHS ABS: 2.5 10*3/uL (ref 0.7–4.0)
Lymphocytes Relative: 25 %
MCH: 25.8 pg — AB (ref 26.0–34.0)
MCHC: 32.5 g/dL (ref 30.0–36.0)
MCV: 79.2 fL (ref 78.0–100.0)
MONOS PCT: 7 %
Monocytes Absolute: 0.7 10*3/uL (ref 0.1–1.0)
NEUTROS PCT: 66 %
Neutro Abs: 6.5 10*3/uL (ref 1.7–7.7)
Platelets: 356 10*3/uL (ref 150–400)
RBC: 4.19 MIL/uL (ref 3.87–5.11)
RDW: 15.8 % — ABNORMAL HIGH (ref 11.5–15.5)
WBC: 9.8 10*3/uL (ref 4.0–10.5)

## 2017-03-30 LAB — BASIC METABOLIC PANEL
Anion gap: 10 (ref 5–15)
BUN: 27 mg/dL — AB (ref 6–20)
CHLORIDE: 105 mmol/L (ref 101–111)
CO2: 22 mmol/L (ref 22–32)
CREATININE: 1.29 mg/dL — AB (ref 0.44–1.00)
Calcium: 9.4 mg/dL (ref 8.9–10.3)
GFR calc Af Amer: 54 mL/min — ABNORMAL LOW (ref >60)
GFR calc non Af Amer: 46 mL/min — ABNORMAL LOW (ref >60)
GLUCOSE: 111 mg/dL — AB (ref 65–99)
Potassium: 4.4 mmol/L (ref 3.5–5.1)
SODIUM: 137 mmol/L (ref 135–145)

## 2017-03-30 LAB — I-STAT CG4 LACTIC ACID, ED
Lactic Acid, Venous: 0.77 mmol/L (ref 0.5–1.9)
Lactic Acid, Venous: 0.68 mmol/L (ref 0.5–1.9)

## 2017-03-30 LAB — TSH: TSH: 0.786 u[IU]/mL (ref 0.350–4.500)

## 2017-03-30 LAB — PROTIME-INR
INR: 1.05
INR: 1.07
Prothrombin Time: 13.7 seconds (ref 11.4–15.2)
Prothrombin Time: 13.9 seconds (ref 11.4–15.2)

## 2017-03-30 MED ORDER — ACETAMINOPHEN 325 MG PO TABS: 650.0000 mg | ORAL_TABLET | Freq: Four times a day (QID) | ORAL | Status: DC | PRN

## 2017-03-30 MED ORDER — HYDROMORPHONE HCL 1 MG/ML IJ SOLN
1.0000 mg | Freq: Once | INTRAMUSCULAR | Status: AC
  Administered 2017-03-30: 1 mg via INTRAVENOUS
  Filled 2017-03-30: qty 1

## 2017-03-30 MED ORDER — COLLAGENASE 250 UNIT/GM EX OINT
TOPICAL_OINTMENT | Freq: Three times a day (TID) | CUTANEOUS | Status: DC
  Administered 2017-04-01: 1 via TOPICAL
  Filled 2017-03-30: qty 90

## 2017-03-30 MED ORDER — SODIUM CHLORIDE 0.9 % IV SOLN
1.5000 g | Freq: Two times a day (BID) | INTRAVENOUS | Status: DC
  Administered 2017-03-30 – 2017-03-31 (×2): 1.5 g via INTRAVENOUS
  Filled 2017-03-30 (×3): qty 1.5

## 2017-03-30 MED ORDER — ACETAMINOPHEN 650 MG RE SUPP: 650.0000 mg | Freq: Four times a day (QID) | RECTAL | Status: DC | PRN

## 2017-03-30 MED ORDER — SODIUM CHLORIDE 0.9% FLUSH
3.0000 mL | Freq: Two times a day (BID) | INTRAVENOUS | Status: DC
  Administered 2017-03-30 – 2017-03-31 (×2): 3 mL via INTRAVENOUS

## 2017-03-30 MED ORDER — ONDANSETRON HCL 4 MG/2ML IJ SOLN: 4.0000 mg | Freq: Four times a day (QID) | INTRAMUSCULAR | Status: DC | PRN

## 2017-03-30 MED ORDER — OLMESARTAN-AMLODIPINE-HCTZ 40-10-12.5 MG PO TABS: 1.0000 | ORAL_TABLET | Freq: Every day | ORAL | Status: DC

## 2017-03-30 MED ORDER — HEPARIN SODIUM (PORCINE) 5000 UNIT/ML IJ SOLN
5000.0000 [IU] | Freq: Three times a day (TID) | INTRAMUSCULAR | Status: DC
  Administered 2017-03-30 – 2017-04-01 (×5): 5000 [IU] via SUBCUTANEOUS
  Filled 2017-03-30 (×5): qty 1

## 2017-03-30 MED ORDER — DEXTROSE 5 % IV SOLN
1.0000 g | Freq: Once | INTRAVENOUS | Status: AC
  Administered 2017-03-30: 1 g via INTRAVENOUS
  Filled 2017-03-30: qty 10

## 2017-03-30 MED ORDER — SODIUM CHLORIDE 0.9 % IV BOLUS (SEPSIS)
1000.0000 mL | Freq: Once | INTRAVENOUS | Status: AC
  Administered 2017-03-30: 1000 mL via INTRAVENOUS

## 2017-03-30 MED ORDER — METRONIDAZOLE 0.75 % EX GEL
Freq: Two times a day (BID) | CUTANEOUS | Status: DC
  Administered 2017-04-01: 1 via TOPICAL
  Filled 2017-03-30: qty 45

## 2017-03-30 MED ORDER — HYDROCODONE-ACETAMINOPHEN 5-325 MG PO TABS
1.0000 | ORAL_TABLET | ORAL | Status: DC | PRN
  Administered 2017-03-30 (×2): 1 via ORAL
  Administered 2017-03-30 – 2017-03-31 (×4): 2 via ORAL
  Administered 2017-04-01: 1 via ORAL
  Administered 2017-04-01 (×2): 2 via ORAL
  Filled 2017-03-30 (×7): qty 2
  Filled 2017-03-30: qty 1

## 2017-03-30 MED ORDER — MORPHINE SULFATE (PF) 4 MG/ML IV SOLN
4.0000 mg | Freq: Once | INTRAVENOUS | Status: AC
  Administered 2017-03-30: 4 mg via INTRAVENOUS
  Filled 2017-03-30: qty 1

## 2017-03-30 MED ORDER — HYDROCHLOROTHIAZIDE 12.5 MG PO CAPS
12.5000 mg | ORAL_CAPSULE | Freq: Every day | ORAL | Status: DC
  Administered 2017-03-30 – 2017-04-01 (×3): 12.5 mg via ORAL
  Filled 2017-03-30 (×3): qty 1

## 2017-03-30 MED ORDER — IRBESARTAN 150 MG PO TABS
300.0000 mg | ORAL_TABLET | Freq: Every day | ORAL | Status: DC
  Administered 2017-03-30 – 2017-04-01 (×3): 300 mg via ORAL
  Filled 2017-03-30 (×3): qty 2

## 2017-03-30 MED ORDER — AMLODIPINE BESYLATE 10 MG PO TABS
10.0000 mg | ORAL_TABLET | Freq: Every day | ORAL | Status: DC
  Administered 2017-03-30 – 2017-04-01 (×3): 10 mg via ORAL
  Filled 2017-03-30 (×3): qty 1

## 2017-03-30 MED ORDER — VANCOMYCIN HCL IN DEXTROSE 1-5 GM/200ML-% IV SOLN
1000.0000 mg | Freq: Once | INTRAVENOUS | Status: AC
  Administered 2017-03-30: 1000 mg via INTRAVENOUS
  Filled 2017-03-30: qty 200

## 2017-03-30 MED ORDER — SODIUM CHLORIDE 0.9 % IV BOLUS (SEPSIS)
1000.0000 mL | Freq: Once | INTRAVENOUS | Status: AC
Start: 2017-03-30 — End: 2017-03-30
  Administered 2017-03-30: 1000 mL via INTRAVENOUS

## 2017-03-30 MED ORDER — KETOROLAC TROMETHAMINE 15 MG/ML IJ SOLN
15.0000 mg | Freq: Four times a day (QID) | INTRAMUSCULAR | Status: DC | PRN
  Administered 2017-03-30 – 2017-04-01 (×3): 15 mg via INTRAVENOUS
  Filled 2017-03-30 (×3): qty 1

## 2017-03-30 MED ORDER — ONDANSETRON HCL 4 MG PO TABS: 4.0000 mg | ORAL_TABLET | Freq: Four times a day (QID) | ORAL | Status: DC | PRN

## 2017-03-30 MED ORDER — SODIUM CHLORIDE 0.9 % IV SOLN
INTRAVENOUS | Status: DC
  Administered 2017-03-30 – 2017-03-31 (×2): via INTRAVENOUS

## 2017-03-30 MED ORDER — HYDRALAZINE HCL 20 MG/ML IJ SOLN
10.0000 mg | Freq: Four times a day (QID) | INTRAMUSCULAR | Status: DC | PRN
  Administered 2017-03-30: 10 mg via INTRAVENOUS
  Filled 2017-03-30: qty 0.5

## 2017-03-30 NOTE — ED Notes (Signed)
BP taken twice due to movement

## 2017-03-30 NOTE — ED Provider Notes (Signed)
[facility] DEPT Provider Note   CSN: [id number] Arrival date & time: 03/30/17  6387     History   Chief Complaint Chief Complaint  Patient presents with  . Leg Pain    HPI [name] is a 56 y.o. female.  The history is provided by the patient and medical records. No language interpreter was used.  Wound Check  This is a new problem. The current episode started more than 1 week ago. The problem occurs constantly. The problem has been rapidly worsening. Pertinent negatives include no chest pain, no abdominal pain, no headaches and no shortness of breath. The symptoms are aggravated by walking and standing. Nothing relieves the symptoms. She has tried nothing for the symptoms. The treatment provided no relief.    Past Medical History:  Diagnosis Date  . CAP (community acquired pneumonia) 03/2014 X 2  . Cocaine abuse    ongoing with resultant vaculitis.  . Depression   . Headache    "weekly" (07/29/2016)  . Hypertension   . Inflammatory arthritis   . Migraines    "probably 5-6/yr" (07/29/2016)  . Normocytic anemia    BL Hgb 9.8-12. Last anemia panel 04/2010 - showing Fe 19, ferritin 101.  Pt on monthly B12 injections  . Rheumatoid arthritis(714.0)    patient reported  . 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 Followed by Dr. Louanne Skye    Patient Active Problem List   Diagnosis Date Noted  . AKI (acute kidney injury) ([place]) 02/07/2017  . Acute kidney injury ([place]) 02/06/2017  . Wound healing, delayed   . Osteomyelitis ([place])   . 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 Skin ulcer of lower leg with necrosis of muscle, unspecified laterality ([place]) 06/04/2015  . Protein-calorie malnutrition, severe ([place]) 01/15/2015  . MDD (major depressive disorder), recurrent episode, severe ([place]) 10/16/2014  . Cocaine use disorder, severe, dependence ([place]) 10/10/2014  . 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 Essential hypertension 02/26/2010    Past Surgical History:  Procedure Laterality Date  . AMPUTATION Left 05/22/2016   Procedure: AMPUTATION LEFT LONG FINGER;  Surgeon: Marybelle Killings, MD;  Location: [facility];  Service: Orthopedics;  Laterality: Left;  . HERNIA REPAIR     "stomach"  . I&D EXTREMITY Right 09/26/2015   Procedure: IRRIGATION AND DEBRIDEMENT LEG WOUND  VAC PLACEMENT.;  Surgeon: Loel Lofty Dillingham, DO;  Location: [facility];  Service: Plastics;  Laterality: Right;  . INCISION AND DRAINAGE OF WOUND Bilateral 10/20/2016   Procedure: IRRIGATION AND DEBRIDEMENT WOUND BILATERAL;  Surgeon: Edrick Kins, DPM;  Location: [facility];  Service: Podiatry;  Laterality: Bilateral;  . IRRIGATION AND DEBRIDEMENT ABSCESS Bilateral 09/26/2013   Procedure: DEBRIDEMENT ULCERS BILATERAL THIGHS;  Surgeon: Gwenyth Ober, MD;  Location: [facility];  Service: General;  Laterality: Bilateral;  . SKIN BIOPSY Bilateral    shin nodules    OB History    No data available       Home Medications    Prior to Admission medications   Medication Sig Start Date End Date Taking? Authorizing Provider  collagenase (SANTYL) ointment Apply topically daily. Patient not taking: Reported on 02/01/2017 10/23/16   Minus Liberty, MD  feeding supplement (BOOST / RESOURCE BREEZE) LIQD Take 1 Container by mouth 3 (three) times daily between meals. Patient not taking: Reported on 02/01/2017 10/23/16   Minus Liberty, MD  ferrous sulfate 325 (65 FE) MG tablet  Take 1 tablet (325 mg total) by mouth 2 (two) times daily with a meal. Patient not taking: Reported on 02/01/2017 12/28/16   Molt, Bethany, DO  ibuprofen (ADVIL,MOTRIN) 600 MG tablet Take 1 tablet (600 mg total) by mouth every 8 (eight) hours as needed for mild pain or moderate pain. 01/20/17   Milagros Loll, MD  Multiple Vitamins-Iron (MULTIVITAMINS WITH IRON) TABS tablet Take 1 tablet by mouth daily. Patient not taking: Reported on 02/01/2017 10/24/16    Minus Liberty, MD  mupirocin cream (BACTROBAN) 2 % Apply topically daily. 02/09/17   Shela Leff, MD  Olmesartan-Amlodipine-HCTZ (TRIBENZOR) 40-10-12.5 MG TABS Take 1 tablet by mouth daily. 02/08/17   Shela Leff, MD  Oxycodone HCl 10 MG TABS Take 1 tablet (10 mg total) by mouth every 6 (six) hours as needed (severe pain). 02/08/17   Shela Leff, MD  senna (SENOKOT) 8.6 MG TABS tablet Take 1 tablet (8.6 mg total) by mouth daily as needed for mild constipation. Patient not taking: Reported on 02/01/2017 10/23/16   Minus Liberty, MD    Family History Family History  Problem Relation Age of Onset  . Breast cancer Mother        Breast cancer  . Alcohol abuse Mother   . Colon cancer Maternal Aunt 32  . Alcohol abuse Father     Social History Social History  Substance Use Topics  . Smoking status: Current Every Day Smoker    Packs/day: 0.12    Years: 38.00    Types: Cigarettes  . Smokeless tobacco: Never Used     Comment: 2 A DAY  . Alcohol use No     Allergies   Acetaminophen and Other   Review of Systems Review of Systems  Constitutional: Positive for chills and fever. Negative for appetite change, diaphoresis and fatigue.  HENT: Negative for congestion and rhinorrhea.   Eyes: Negative for visual disturbance.  Respiratory: Negative for chest tightness, shortness of breath, wheezing and stridor.   Cardiovascular: Negative for chest pain.  Gastrointestinal: Negative for abdominal pain, constipation, diarrhea, nausea and vomiting.  Genitourinary: Negative for dysuria and flank pain.  Musculoskeletal: Negative for back pain, neck pain and neck stiffness.  Skin: Positive for rash and wound.  Neurological: Negative for light-headedness, numbness and headaches.  All other systems reviewed and are negative.    Physical Exam Updated Vital Signs BP (!) 248/130 (BP Location: Left Arm) Comment: Pt reports not taking her blood pressure in about 3 months.    Pulse (!) 107   Temp 98.3 F (36.8 C)   Resp (!) 22   SpO2 100%   Physical Exam  Constitutional: She is oriented to person, place, and time. She appears well-developed and well-nourished. She appears distressed.  HENT:  Head: Normocephalic.  Mouth/Throat: Oropharynx is clear and moist. No oropharyngeal exudate.  Eyes: Conjunctivae and EOM are normal. Pupils are equal, round, and reactive to light.  Neck: Normal range of motion.  Cardiovascular: Normal rate and intact distal pulses.   No murmur heard. Pulmonary/Chest: Effort normal and breath sounds normal. No stridor. No respiratory distress. She has no wheezes. She exhibits no tenderness.  Abdominal: Soft. There is no tenderness.  Musculoskeletal: She exhibits tenderness.  Neurological: She is alert and oriented to person, place, and time. No sensory deficit. She exhibits normal muscle tone.  Skin: Capillary refill takes less than 2 seconds. Rash noted. She is not diaphoretic. There is erythema.  Psychiatric: She has a normal mood and affect.  Nursing note  and vitals reviewed.    Left leg   Left leg   R leg     ED Treatments / Results  Labs (all labs ordered are listed, but only abnormal results are displayed) Labs Reviewed  CBC WITH DIFFERENTIAL/PLATELET - Abnormal; Notable for the following:       Result Value   Hemoglobin 10.8 (*)    HCT 33.2 (*)    MCH 25.8 (*)    RDW 15.8 (*)    All other components within normal limits  BASIC METABOLIC PANEL - Abnormal; Notable for the following:    Glucose, Bld 111 (*)    BUN 27 (*)    Creatinine, Ser 1.29 (*)    GFR calc non Af Amer 46 (*)    GFR calc Af Amer 54 (*)    All other components within normal limits  CULTURE, BLOOD (ROUTINE X 2)  CULTURE, BLOOD (ROUTINE X 2)  PROTIME-INR  URINALYSIS, ROUTINE W REFLEX MICROSCOPIC  RAPID URINE DRUG SCREEN, HOSP PERFORMED  I-STAT CG4 LACTIC ACID, ED  I-STAT CG4 LACTIC ACID, ED    EKG  EKG Interpretation None        Radiology Dg Tibia/fibula Left  Result Date: 03/30/2017 CLINICAL DATA:  Pt has hx of ulcer with gangrene in March and had surgery at [facility]. C/o bilateral lower leg and foot pain, EXAM: LEFT TIBIA AND FIBULA - 2 VIEW COMPARISON:  03/22/2017 FINDINGS: No fracture.  No bone lesion. There is no bone resorption to suggest osteomyelitis. Knee and ankle joints are normally spaced and aligned. No arthropathic change. There is a soft tissue ulcer that is most evident posteriorly, involving the distal leg and ankle. The normal Achilles tendon shadows non avid suggesting a disrupted Achilles tendon. There are several bubbles of soft tissue air projecting over the wound. IMPRESSION: 1. No fracture or bone lesion. 2. No evidence of osteomyelitis. Electronically Signed   By: Lajean Manes M.D.   On: 03/30/2017 10:44   Dg Tibia/fibula Right  Result Date: 03/30/2017 CLINICAL DATA:  Pt has hx of ulcer with gangrene in March and had surgery at [facility]. C/o bilateral lower leg and foot pain, EXAM: RIGHT TIBIA AND FIBULA - 2 VIEW COMPARISON:  03/22/2017 FINDINGS: No fracture.  No bone lesion. No bone resorption is seen to suggest osteomyelitis. The knee and ankle joints are normally spaced and aligned. There is a soft tissue wound is most apparent posteriorly, involving the distal leg and posterior ankle. This may disrupts the Achilles tendon. IMPRESSION: 1. No fracture, bone lesion or evidence of osteomyelitis. Electronically Signed   By: Lajean Manes M.D.   On: 03/30/2017 10:46   Dg Foot Complete Left  Result Date: 03/30/2017 CLINICAL DATA:  Pt has hx of ulcer with gangrene in March and had surgery at [facility]. C/o bilateral lower leg and foot pain. EXAM: LEFT FOOT - COMPLETE 3+ VIEW COMPARISON:  02/06/2017 and 12/22/2016 and 09/09/2016. FINDINGS: No fracture.  No bone lesion. There are periarticular erosions at the PIP joint of the second toe, new since 09/09/2016. This could reflect the sequelae of a septic arthropathy.  It may be due to an inflammatory arthropathy. There is no current associated soft tissue swelling. There are no other areas of bone resorption. There is no convincing osteomyelitis. The other joints are normally spaced and aligned. There is a soft tissue wound over the dorsal aspect of the distal leg and ankle. Several bubbles of soft tissue air are noted. IMPRESSION: 1. No convincing osteomyelitis.  2. Erosive changes of the PIP joint of the second toe are likely chronic, but are new since the study dated 09/09/2016 consistent with either the sequelae of an inflammatory arthropathy or septic arthritis. 3. Soft tissue ulcer over the dorsal aspect of the lower leg and ankle. Electronically Signed   By: Lajean Manes M.D.   On: 03/30/2017 10:43   Dg Foot Complete Right  Result Date: 03/30/2017 CLINICAL DATA:  Pt has hx of ulcer with gangrene in March and had surgery at [facility]. C/o bilateral lower leg and foot pain, EXAM: RIGHT FOOT COMPLETE - 3+ VIEW COMPARISON:  Right foot MRI, 02/06/2017 FINDINGS: There is marked joint space narrowing with subchondral erosions and mild ordering sclerosis at the first metatarsophalangeal joint. The proximal phalanx of the great toe has subluxed laterally in relation to the first metatarsal head. These findings are similar to the prior MRI. The remaining joints are normally spaced and aligned. No other arthropathic change. No soft tissue swelling is evident. There is a small focus of subcutaneous air adjacent to the medial first metatarsal head. IMPRESSION: 1. Advanced arthropathic changes of the first metatarsophalangeal joint, which may reflect the sequelae of a septic arthropathy or inflammatory arthropathy. 2. Small focus of subcutaneous air adjacent to the medial first metatarsal head. 3. No other abnormalities. Electronically Signed   By: Lajean Manes M.D.   On: 03/30/2017 11:13    Procedures Procedures (including critical care time)  Medications Ordered in  ED Medications  HYDROcodone-acetaminophen (NORCO/VICODIN) 5-325 MG per tablet 1-2 tablet (not administered)  hydrALAZINE (APRESOLINE) injection 10 mg (not administered)  morphine 4 MG/ML injection 4 mg (4 mg Intravenous Given 03/30/17 0913)  sodium chloride 0.9 % bolus 1,000 mL (1,000 mLs Intravenous New Bag/Given 03/30/17 0913)  HYDROmorphone (DILAUDID) injection 1 mg (1 mg Intravenous Given 03/30/17 1039)  sodium chloride 0.9 % bolus 1,000 mL (0 mLs Intravenous Stopped 03/30/17 1526)  HYDROmorphone (DILAUDID) injection 1 mg (1 mg Intravenous Given 03/30/17 1328)  cefTRIAXone (ROCEPHIN) 1 g in dextrose 5 % 50 mL IVPB (0 g Intravenous Stopped 03/30/17 1552)  HYDROmorphone (DILAUDID) injection 1 mg (1 mg Intravenous Given 03/30/17 1549)     Initial Impression / Assessment and Plan / ED Course  I have reviewed the triage vital signs and the nursing notes.  Pertinent labs & imaging results that were available during my care of the patient were reviewed by me and considered in my medical decision making (see chart for details).     [name] is a 56 y.o. female with a past medical history significant for hypertension, vasculitis and bilateral lower extremity wounds who presents for worsening bilateral lower extremity pain, enlargement of leg wounds, development of foul smell with drainage from the wounds, subjective fevers, chills, and urinary frequency. Patient reports that she has had previous deep infections in her legs and according to documentation, patient may need amputations that will be contingent on her stopping cocaine use. Patient says that she last used cocaine several days ago and will stop on her [birth year] on [birth year].   She says that over the last few days, her pain is acutely worsened and is now 10 out of 10 in severity in both legs. She says that the foul smell is worsening and there were areas of drainage. She describes subjective fevers and chills and some urinary frequency. She  denies abdominal pain chest pain, or other symptoms.  History and exam are seen above. On exam, patient has wounds on  both posterior lower legs. Patient also has wounds on the dorsum of both of her great toes. She says the toe wounds have been new. Patient has symmetric DP pulses and has sensation in both feet. Patient has normal capillary refill. See clinical photo for wound.   Given foul smell, there is concern for worsening infection. Patient will have x-rays to look for ostiomyelitis or septic case gas. She will have lab testing to look for infection and we given pain medicine and fluids. Anticipate admission given worsening appearance of wounds  9 prostate testing results are seen above. X-rays did not show evidence of osteomyelitis or fractures. Lab testing was overall grossly reassuring.  Reassessment revealed patient continued to have severe pain and requiring IV pain medications. Patient's wounds continued to smell foul and look infected. Pharmacy was called and they recommended Rocephin based on prior cultures. Patient given antibiotic. Clinically, there is concern for infection with appearance of necrosis. Patient was admitted to hospitalist service for further management of her severe pain and infected wounds.  Patient admitted to hospitalist service.   Final Clinical Impressions(s) / ED Diagnoses   Final diagnoses:  Cellulitis of lower extremity, unspecified laterality  Bilateral leg pain   Clinical Impression: 1. Cellulitis of lower extremity, unspecified laterality   2. Bilateral leg pain     Disposition: Admit to Hospitalist service    Tegeler, Gwenyth Allegra, MD 03/30/17 [phone]

## 2017-03-30 NOTE — Progress Notes (Signed)
Pharmacy Antibiotic Note  [name] is a 56 y.o. female admitted on 03/30/2017 with necrotic, odorous, painful lower extremity wound infection. Pharmacy has been consulted for Vancomycin and Unasyn dosing.  Plan: Vancomycin 1g IV x 1 already given as ordered by MD. Will check random Vancomycin level 6/27 PM and assess for further dosing. Unasyn 1.5g IV q12h. Monitor renal function, cultures, clinical course.   Height: 5' 1.5" (156.2 cm) Weight: 78 lb 6.4 oz (35.6 kg) IBW/kg (Calculated) : 48.95  Temp (24hrs), Avg:98.7 F (37.1 C), Min:98.3 F (36.8 C), Max:99 F (37.2 C)   Recent Labs Lab 03/30/17 0908 03/30/17 0924 03/30/17 1322  WBC 9.8  --   --   CREATININE 1.29*  --   --   LATICACIDVEN  --  0.77 0.68    Estimated Creatinine Clearance: 28.3 mL/min (A) (by C-G formula based on SCr of 1.29 mg/dL (H)).    Allergies  Allergen Reactions  . Acetaminophen Swelling and Other (See Comments)    Reaction:  Eyelid swelling  . Other Other (See Comments)    Pt states she is allergic to a blood pressure medication, but does not know the reaction or medication    Antimicrobials this admission: 6/26 >> Ceftriaxone x 1 6/26 >> Vancomycin >> 6/26 >> Unasyn >>  Dose adjustments this admission: --  Microbiology results: 6/26 BCx: sent   Thank you for allowing pharmacy to be a part of this patient's care.   Lindell Spar, PharmD, BCPS Pager: [phone] 03/30/2017 8:02 PM

## 2017-03-30 NOTE — ED Notes (Signed)
Bed: MK73 Expected date:  Expected time:  Means of arrival:  Comments: Hold for [place] D

## 2017-03-30 NOTE — H&P (Signed)
History and Physical    [name]:[id number] DOB: 05-07-1961 DOA: 03/30/2017  PCP: Internal medicine teaching service, last note point she was seen via nasal discharge from that clinic because of multiple no shows. Patient coming from: Home  Chief Complaint: Bilateral lower extremity pain  HPI: [name] is a 56 y.o. female with medical history significant of chronic bilateral lower extremity wound, persistent cocaine use, hypertension and no tears to medical therapy. She presented to the hospital complaining about worsening of pain in her legs, she had one of the worst orders will have smelled in a wound recently, the wound is blackened and appears to be a chronic (please review the pictures attached to the H&P) although the wound appeared blackened and necrotic she does not have fever or leukocytosis. Patient admitted to the hospital for further evaluation of her wounds.  ED Course:  Vitals: Blood pressure elevated Labs: WNL creatinine 1.29. Imaging: N/A Interventions: Started on Rocephin  Review of Systems:  12 point review of systems negative except for symptoms mentioned in the history of present illness  Past Medical History:  Diagnosis Date  . CAP (community acquired pneumonia) 03/2014 X 2  . Cocaine abuse    ongoing with resultant vaculitis.  . Depression   . Headache    "weekly" (07/29/2016)  . Hypertension   . Inflammatory arthritis   . Migraines    "probably 5-6/yr" (07/29/2016)  . Normocytic anemia    BL Hgb 9.8-12. Last anemia panel 04/2010 - showing Fe 19, ferritin 101.  Pt on monthly B12 injections  . Rheumatoid arthritis(714.0)    patient reported  . 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 Followed by Dr. Louanne Skye    Past Surgical History:  Procedure Laterality Date  . AMPUTATION Left 05/22/2016   Procedure: AMPUTATION LEFT LONG FINGER;  Surgeon: Marybelle Killings, MD;  Location: [facility];  Service:  Orthopedics;  Laterality: Left;  . HERNIA REPAIR     "stomach"  . I&D EXTREMITY Right 09/26/2015   Procedure: IRRIGATION AND DEBRIDEMENT LEG WOUND  VAC PLACEMENT.;  Surgeon: Loel Lofty Dillingham, DO;  Location: [facility];  Service: Plastics;  Laterality: Right;  . INCISION AND DRAINAGE OF WOUND Bilateral 10/20/2016   Procedure: IRRIGATION AND DEBRIDEMENT WOUND BILATERAL;  Surgeon: Edrick Kins, DPM;  Location: [facility];  Service: Podiatry;  Laterality: Bilateral;  . IRRIGATION AND DEBRIDEMENT ABSCESS Bilateral 09/26/2013   Procedure: DEBRIDEMENT ULCERS BILATERAL THIGHS;  Surgeon: Gwenyth Ober, MD;  Location: [facility];  Service: General;  Laterality: Bilateral;  . SKIN BIOPSY Bilateral    shin nodules     reports that she has been smoking Cigarettes.  She has a 4.56 pack-year smoking history. She has never used smokeless tobacco. She reports that she uses drugs, including "Crack" cocaine and Cocaine. She reports that she does not drink alcohol.  Allergies  Allergen Reactions  . Acetaminophen Swelling and Other (See Comments)    Reaction:  Eyelid swelling  . Other Other (See Comments)    Pt states she is allergic to a blood pressure medication, but does not know the reaction or medication    Family History  Problem Relation Age of Onset  . Breast cancer Mother        Breast cancer  . Alcohol abuse Mother   . Colon cancer Maternal Aunt 7  . Alcohol abuse Father     Prior to Admission medications  Medication Sig Start Date End Date Taking? Authorizing Provider  ibuprofen (ADVIL,MOTRIN) 200 MG tablet Take 800 mg by mouth every 6 (six) hours as needed for moderate pain.   Yes [provider]  collagenase (SANTYL) ointment Apply topically daily. Patient not taking: Reported on 02/01/2017 10/23/16   Minus Liberty, MD  feeding supplement (BOOST / RESOURCE BREEZE) LIQD Take 1 Container by mouth 3 (three) times daily between meals. Patient not taking: Reported on 02/01/2017 10/23/16    Minus Liberty, MD  ferrous sulfate 325 (65 FE) MG tablet Take 1 tablet (325 mg total) by mouth 2 (two) times daily with a meal. Patient not taking: Reported on 03/30/2017 12/28/16   Molt, Bethany, DO  ibuprofen (ADVIL,MOTRIN) 600 MG tablet Take 1 tablet (600 mg total) by mouth every 8 (eight) hours as needed for mild pain or moderate pain. Patient not taking: Reported on 03/30/2017 01/20/17   Milagros Loll, MD  Multiple Vitamins-Iron (MULTIVITAMINS WITH IRON) TABS tablet Take 1 tablet by mouth daily. Patient not taking: Reported on 02/01/2017 10/24/16   Minus Liberty, MD  mupirocin cream (BACTROBAN) 2 % Apply topically daily. Patient not taking: Reported on 03/30/2017 02/09/17   Shela Leff, MD  Olmesartan-Amlodipine-HCTZ [facility]) 40-10-12.5 MG TABS Take 1 tablet by mouth daily. Patient not taking: Reported on 03/30/2017 02/08/17   Shela Leff, MD  Oxycodone HCl 10 MG TABS Take 1 tablet (10 mg total) by mouth every 6 (six) hours as needed (severe pain). Patient not taking: Reported on 03/30/2017 02/08/17   Shela Leff, MD  senna (SENOKOT) 8.6 MG TABS tablet Take 1 tablet (8.6 mg total) by mouth daily as needed for mild constipation. Patient not taking: Reported on 02/01/2017 10/23/16   Minus Liberty, MD    Physical Exam:  Vitals:   03/30/17 1038 03/30/17 1315 03/30/17 1345 03/30/17 1532  BP: (!) 147/101  (!) 171/102 (!) 226/114  Pulse: (!) 104   (!) 102  Resp: (!) 22 (!) 22  18  Temp:      SpO2: 99%   96%    Constitutional: NAD, calm, comfortable Eyes: PERRL, lids and conjunctivae normal ENMT: Mucous membranes are moist. Posterior pharynx clear of any exudate or lesions.Normal dentition.  Neck: normal, supple, no masses, no thyromegaly Respiratory: clear to auscultation bilaterally, no wheezing, no crackles. Normal respiratory effort. No accessory muscle use.  Cardiovascular: Regular rate and rhythm, no murmurs / rubs / gallops. No extremity edema. 2+  pedal pulses. No carotid bruits.  Abdomen: no tenderness, no masses palpated. No hepatosplenomegaly. Bowel sounds positive.  Musculoskeletal: Bones and both of the back of the legs Skin: no rashes, lesions, ulcers. No induration Neurologic: CN 2-12 grossly intact. Sensation intact, DTR normal. Strength 5/5 in all 4.  Psychiatric: Normal judgment and insight. Alert and oriented x 3. Normal mood.           Labs on Admission: I have personally reviewed following labs and imaging studies  CBC:  Recent Labs Lab 03/30/17 0908  WBC 9.8  NEUTROABS 6.5  HGB 10.8*  HCT 33.2*  MCV 79.2  PLT 638   Basic Metabolic Panel:  Recent Labs Lab 03/30/17 0908  NA 137  K 4.4  CL 105  CO2 22  GLUCOSE 111*  BUN 27*  CREATININE 1.29*  CALCIUM 9.4   GFR: CrCl cannot be calculated (Unknown ideal weight.). Liver Function Tests: No results for input(s): AST, ALT, ALKPHOS, BILITOT, PROT, ALBUMIN in the last 168 hours. No results for input(s): LIPASE, AMYLASE in  the last 168 hours. No results for input(s): AMMONIA in the last 168 hours. Coagulation Profile:  Recent Labs Lab 03/30/17 0908  INR 1.05   Cardiac Enzymes: No results for input(s): CKTOTAL, CKMB, CKMBINDEX, TROPONINI in the last 168 hours. BNP (last 3 results) No results for input(s): PROBNP in the last 8760 hours. HbA1C: No results for input(s): HGBA1C in the last 72 hours. CBG: No results for input(s): GLUCAP in the last 168 hours. Lipid Profile: No results for input(s): CHOL, HDL, LDLCALC, TRIG, CHOLHDL, LDLDIRECT in the last 72 hours. Thyroid Function Tests: No results for input(s): TSH, T4TOTAL, FREET4, T3FREE, THYROIDAB in the last 72 hours. Anemia Panel: No results for input(s): VITAMINB12, FOLATE, FERRITIN, TIBC, IRON, RETICCTPCT in the last 72 hours. Urine analysis:    Component Value Date/Time   COLORURINE YELLOW 02/06/2017 0615   APPEARANCEUR HAZY (A) 02/06/2017 0615   LABSPEC 1.021 02/06/2017 0615    PHURINE 5.0 02/06/2017 0615   GLUCOSEU NEGATIVE 02/06/2017 0615   HGBUR SMALL (A) 02/06/2017 0615   BILIRUBINUR NEGATIVE 02/06/2017 0615   BILIRUBINUR small 04/29/2015 1533   KETONESUR NEGATIVE 02/06/2017 0615   PROTEINUR 100 (A) 02/06/2017 0615   UROBILINOGEN 1.0 06/14/2015 2234   NITRITE NEGATIVE 02/06/2017 0615   LEUKOCYTESUR NEGATIVE 02/06/2017 0615   Sepsis Labs: !!!!!!!!!!!!!!!!!!!!!!!!!!!!!!!!!!!!!!!!!!!! Invalid input(s): PROCALCITONIN, LACTICIDVEN No results found for this or any previous visit (from the past 240 hour(s)).   Radiological Exams on Admission: Dg Tibia/fibula Left  Result Date: 03/30/2017 CLINICAL DATA:  Pt has hx of ulcer with gangrene in March and had surgery at [facility]. C/o bilateral lower leg and foot pain, EXAM: LEFT TIBIA AND FIBULA - 2 VIEW COMPARISON:  03/22/2017 FINDINGS: No fracture.  No bone lesion. There is no bone resorption to suggest osteomyelitis. Knee and ankle joints are normally spaced and aligned. No arthropathic change. There is a soft tissue ulcer that is most evident posteriorly, involving the distal leg and ankle. The normal Achilles tendon shadows non avid suggesting a disrupted Achilles tendon. There are several bubbles of soft tissue air projecting over the wound. IMPRESSION: 1. No fracture or bone lesion. 2. No evidence of osteomyelitis. Electronically Signed   By: Lajean Manes M.D.   On: 03/30/2017 10:44   Dg Tibia/fibula Right  Result Date: 03/30/2017 CLINICAL DATA:  Pt has hx of ulcer with gangrene in March and had surgery at [facility]. C/o bilateral lower leg and foot pain, EXAM: RIGHT TIBIA AND FIBULA - 2 VIEW COMPARISON:  03/22/2017 FINDINGS: No fracture.  No bone lesion. No bone resorption is seen to suggest osteomyelitis. The knee and ankle joints are normally spaced and aligned. There is a soft tissue wound is most apparent posteriorly, involving the distal leg and posterior ankle. This may disrupts the Achilles tendon. IMPRESSION: 1. No  fracture, bone lesion or evidence of osteomyelitis. Electronically Signed   By: Lajean Manes M.D.   On: 03/30/2017 10:46   Dg Foot Complete Left  Result Date: 03/30/2017 CLINICAL DATA:  Pt has hx of ulcer with gangrene in March and had surgery at [facility]. C/o bilateral lower leg and foot pain. EXAM: LEFT FOOT - COMPLETE 3+ VIEW COMPARISON:  02/06/2017 and 12/22/2016 and 09/09/2016. FINDINGS: No fracture.  No bone lesion. There are periarticular erosions at the PIP joint of the second toe, new since 09/09/2016. This could reflect the sequelae of a septic arthropathy. It may be due to an inflammatory arthropathy. There is no current associated soft tissue swelling. There are no other  areas of bone resorption. There is no convincing osteomyelitis. The other joints are normally spaced and aligned. There is a soft tissue wound over the dorsal aspect of the distal leg and ankle. Several bubbles of soft tissue air are noted. IMPRESSION: 1. No convincing osteomyelitis. 2. Erosive changes of the PIP joint of the second toe are likely chronic, but are new since the study dated 09/09/2016 consistent with either the sequelae of an inflammatory arthropathy or septic arthritis. 3. Soft tissue ulcer over the dorsal aspect of the lower leg and ankle. Electronically Signed   By: Lajean Manes M.D.   On: 03/30/2017 10:43   Dg Foot Complete Right  Result Date: 03/30/2017 CLINICAL DATA:  Pt has hx of ulcer with gangrene in March and had surgery at [facility]. C/o bilateral lower leg and foot pain, EXAM: RIGHT FOOT COMPLETE - 3+ VIEW COMPARISON:  Right foot MRI, 02/06/2017 FINDINGS: There is marked joint space narrowing with subchondral erosions and mild ordering sclerosis at the first metatarsophalangeal joint. The proximal phalanx of the great toe has subluxed laterally in relation to the first metatarsal head. These findings are similar to the prior MRI. The remaining joints are normally spaced and aligned. No other arthropathic  change. No soft tissue swelling is evident. There is a small focus of subcutaneous air adjacent to the medial first metatarsal head. IMPRESSION: 1. Advanced arthropathic changes of the first metatarsophalangeal joint, which may reflect the sequelae of a septic arthropathy or inflammatory arthropathy. 2. Small focus of subcutaneous air adjacent to the medial first metatarsal head. 3. No other abnormalities. Electronically Signed   By: Lajean Manes M.D.   On: 03/30/2017 11:13    EKG: Independently reviewed.   Assessment/Plan Principal Problem:   Wound infection Active Problems:   Cocaine-induced vascular disorder (HCC)   Cocaine use disorder, severe, dependence (HCC)   MDD (major depressive disorder), recurrent episode, severe (HCC)   Protein-calorie malnutrition, severe (HCC)   Ulcer with gangrene, with necrosis of muscle (HCC)   Wound infection -Presented with worsening pain although she doesn't have fever or leukocytosis but the wounds appeared to be necrotic and blackened, has horrible odor. -According to the notes, Ortho recommended cocaine abuse cessation before intervention. -She has history of positive MRSA swab. -Started on Unasyn and vancomycin. [place] team consulted, if more debridement needed might consider surgical consultation. -Added metronidazole gel (to control the wound oder) and Santyl.  Accelerated hypertension -Blood pressure elevated at 200/100, part of this secondary to pain. -Reported cocaine use about 4 days ago. -She is on Tribenzor, restarted, hydralazine as needed for high blood pressure.  Severe protein calorie malnutrition -RD to evaluate.  Renal sufficiency -Creatinine is 1.29, recently been up and down, BUN elevated suggesting dehydration. -Hydrated with IV fluids, check BMP in a.m. Unclear chronicity.  Substance abuse induced mood disorder -Patient is very emotional, crying asking about pain medications. -She started on ketorolac, try to avoid opioid  pain medications.   DVT prophylaxis: SQ Heparin Code Status: Full code Family Communication: Plan D/W patient Disposition Plan: Home Consults called: Wound care. Admission status: Observation   [facility] A MD [facility] Pager [phone]  If 7PM-7AM, please contact night-coverage [email or web address] Password [email or web address]  03/30/2017, 4:18 PM

## 2017-03-30 NOTE — ED Triage Notes (Addendum)
Pt brought in by EMS with c/o bilateral leg pain  Pt states she has had pain in that legs for a long time but it became unbearable last night  Pt has a dressing on her lower legs and feet  EMS did not remove but states that there is a foul odor coming from them  Pt states she has skin hanging off her heel  Pt has hx of ulcer with gangrene in March and had surgery at [facility]

## 2017-03-31 DIAGNOSIS — E43 Unspecified severe protein-calorie malnutrition: Secondary | ICD-10-CM

## 2017-03-31 DIAGNOSIS — I999 Unspecified disorder of circulatory system: Secondary | ICD-10-CM

## 2017-03-31 DIAGNOSIS — F14988 Cocaine use, unspecified with other cocaine-induced disorder: Secondary | ICD-10-CM

## 2017-03-31 DIAGNOSIS — I96 Gangrene, not elsewhere classified: Secondary | ICD-10-CM | POA: Diagnosis not present

## 2017-03-31 DIAGNOSIS — F1420 Cocaine dependence, uncomplicated: Secondary | ICD-10-CM

## 2017-03-31 DIAGNOSIS — I10 Essential (primary) hypertension: Secondary | ICD-10-CM | POA: Diagnosis present

## 2017-03-31 DIAGNOSIS — I776 Arteritis, unspecified: Secondary | ICD-10-CM | POA: Diagnosis present

## 2017-03-31 DIAGNOSIS — T148XXA Other injury of unspecified body region, initial encounter: Secondary | ICD-10-CM | POA: Diagnosis not present

## 2017-03-31 DIAGNOSIS — M79605 Pain in left leg: Secondary | ICD-10-CM | POA: Diagnosis not present

## 2017-03-31 DIAGNOSIS — N179 Acute kidney failure, unspecified: Secondary | ICD-10-CM | POA: Diagnosis present

## 2017-03-31 DIAGNOSIS — L03119 Cellulitis of unspecified part of limb: Secondary | ICD-10-CM | POA: Diagnosis present

## 2017-03-31 DIAGNOSIS — Z888 Allergy status to other drugs, medicaments and biological substances status: Secondary | ICD-10-CM | POA: Diagnosis not present

## 2017-03-31 DIAGNOSIS — M79604 Pain in right leg: Secondary | ICD-10-CM

## 2017-03-31 DIAGNOSIS — Z886 Allergy status to analgesic agent status: Secondary | ICD-10-CM | POA: Diagnosis not present

## 2017-03-31 DIAGNOSIS — Z89022 Acquired absence of left finger(s): Secondary | ICD-10-CM | POA: Diagnosis not present

## 2017-03-31 DIAGNOSIS — L089 Local infection of the skin and subcutaneous tissue, unspecified: Secondary | ICD-10-CM | POA: Diagnosis not present

## 2017-03-31 DIAGNOSIS — F332 Major depressive disorder, recurrent severe without psychotic features: Secondary | ICD-10-CM | POA: Diagnosis present

## 2017-03-31 DIAGNOSIS — F14288 Cocaine dependence with other cocaine-induced disorder: Secondary | ICD-10-CM | POA: Diagnosis present

## 2017-03-31 DIAGNOSIS — R51 Headache: Secondary | ICD-10-CM | POA: Diagnosis present

## 2017-03-31 DIAGNOSIS — M069 Rheumatoid arthritis, unspecified: Secondary | ICD-10-CM | POA: Diagnosis present

## 2017-03-31 DIAGNOSIS — Z9119 Patient's noncompliance with other medical treatment and regimen: Secondary | ICD-10-CM | POA: Diagnosis not present

## 2017-03-31 DIAGNOSIS — F17210 Nicotine dependence, cigarettes, uncomplicated: Secondary | ICD-10-CM | POA: Diagnosis present

## 2017-03-31 DIAGNOSIS — Z681 Body mass index (BMI) 19 or less, adult: Secondary | ICD-10-CM | POA: Diagnosis not present

## 2017-03-31 LAB — RAPID URINE DRUG SCREEN, HOSP PERFORMED
Amphetamines: NOT DETECTED
Barbiturates: NOT DETECTED
Benzodiazepines: NOT DETECTED
Cocaine: POSITIVE — AB
Opiates: POSITIVE — AB
TETRAHYDROCANNABINOL: NOT DETECTED

## 2017-03-31 LAB — VANCOMYCIN, RANDOM: VANCOMYCIN RM: 7

## 2017-03-31 LAB — CBC
HCT: 29.4 % — ABNORMAL LOW (ref 36.0–46.0)
Hemoglobin: 9.5 g/dL — ABNORMAL LOW (ref 12.0–15.0)
MCH: 25.4 pg — AB (ref 26.0–34.0)
MCHC: 32.3 g/dL (ref 30.0–36.0)
MCV: 78.6 fL (ref 78.0–100.0)
Platelets: 352 10*3/uL (ref 150–400)
RBC: 3.74 MIL/uL — ABNORMAL LOW (ref 3.87–5.11)
RDW: 15.8 % — AB (ref 11.5–15.5)
WBC: 10.5 10*3/uL (ref 4.0–10.5)

## 2017-03-31 LAB — BASIC METABOLIC PANEL
Anion gap: 9 (ref 5–15)
BUN: 15 mg/dL (ref 6–20)
CALCIUM: 9 mg/dL (ref 8.9–10.3)
CO2: 22 mmol/L (ref 22–32)
Chloride: 106 mmol/L (ref 101–111)
Creatinine, Ser: 0.83 mg/dL (ref 0.44–1.00)
GFR calc Af Amer: >60 mL/min (ref >60)
GLUCOSE: 84 mg/dL (ref 65–99)
Potassium: 3.9 mmol/L (ref 3.5–5.1)
Sodium: 137 mmol/L (ref 135–145)

## 2017-03-31 LAB — MRSA PCR SCREENING: MRSA BY PCR: NEGATIVE

## 2017-03-31 MED ORDER — BOOST / RESOURCE BREEZE PO LIQD
1.0000 | Freq: Three times a day (TID) | ORAL | Status: DC
  Administered 2017-03-31 – 2017-04-01 (×3): 1 via ORAL

## 2017-03-31 MED ORDER — JUVEN PO PACK
1.0000 | PACK | Freq: Two times a day (BID) | ORAL | Status: DC
  Administered 2017-03-31 – 2017-04-01 (×2): 1 via ORAL
  Filled 2017-03-31 (×4): qty 1

## 2017-03-31 MED ORDER — VANCOMYCIN HCL IN DEXTROSE 750-5 MG/150ML-% IV SOLN
750.0000 mg | INTRAVENOUS | Status: DC
  Administered 2017-03-31: 750 mg via INTRAVENOUS
  Filled 2017-03-31: qty 150

## 2017-03-31 MED ORDER — SODIUM CHLORIDE 0.9 % IV SOLN
1.5000 g | Freq: Four times a day (QID) | INTRAVENOUS | Status: DC
  Administered 2017-03-31 – 2017-04-01 (×3): 1.5 g via INTRAVENOUS
  Filled 2017-03-31 (×4): qty 1.5

## 2017-03-31 MED ORDER — ADULT MULTIVITAMIN W/MINERALS CH
1.0000 | ORAL_TABLET | Freq: Every day | ORAL | Status: DC
  Administered 2017-03-31: 1 via ORAL
  Filled 2017-03-31: qty 1

## 2017-03-31 NOTE — Progress Notes (Signed)
PROGRESS NOTE    [name]  WVP:[id number] DOB: Feb 24, 1961 DOA: 03/30/2017 PCP: Patient, No Pcp Per   Brief Narrative: [name] is a 56 y.o. female with a history of chronic bilateral lower extremity wounds, cocaine abuse, hypertension, depression. Patient presents with worsening pain of her wounds. Concern for infection and patient was started on IV antibiotics. Blood cultures pending.   Assessment & Plan:   Principal Problem:   Wound infection Active Problems:   Cocaine-induced vascular disorder (HCC)   Cocaine use disorder, severe, dependence (HCC)   MDD (major depressive disorder), recurrent episode, severe (HCC)   Protein-calorie malnutrition, severe ([place])   Ulcer with gangrene, with necrosis of muscle (HCC)   Chronic wound Wound infection Appears acutely infected. No leukocytosis or fevers. Given Vancomycin and ceftriaxone in ED. Started on Unasyn. -continue Unasyn -continue Vancomycin -blood cultures pending -wound care  Levamisole-induced vasculitis -continued wound care as an outpatient  Accelerated hypertension Improved -continue antihypertensives  Protein-calorie malnutrition, severe Dietitian recommendations: Boost breeze TID, magic cup TID, fruit punch BID  Acute kidney injury Resolved.   DVT prophylaxis: Heparin Code Status: Full code Family Communication: None at bedside Disposition Plan: Discharge in 24 hours   Consultants:   Infectious disease (telephone), Dr. Megan Salon  Procedures:   None  Antimicrobials:  Ceftriaxone  Vancomycin  Unasyn    Subjective: History reports pain located around her wounds.  Objective: Vitals:   03/30/17 1900 03/30/17 2111 03/31/17 0418 03/31/17 1009  BP: (!) 151/90 (!) 125/49 (!) 170/85 (!) 170/72  Pulse: (!) 108 (!) 103 95 (!) 106  Resp: 20 14 20    Temp: 99 F (37.2 C) 99.3 F (37.4 C) 98.3 F (36.8 C)   TempSrc: Oral Oral Oral   SpO2: 100% 100% 100%   Weight: 35.6 kg (78 lb 6.4 oz)      Height: 5' 1.5" (1.562 m)       Intake/Output Summary (Last 24 hours) at 03/31/17 1027 Last data filed at 03/31/17 1010  Gross per 24 hour  Intake             1370 ml  Output                0 ml  Net             1370 ml   Filed Weights   03/30/17 1900  Weight: 35.6 kg (78 lb 6.4 oz)    Examination:  General exam: Appears calm and comfortable Respiratory system: Respiratory effort normal. Central nervous system: Alert and oriented. No focal neurological deficits. Extremities: No edema. No calf tenderness Skin: Right leg wrapped in gauze. Left leg significant for large wound with black colored slough, no surrounding erythema and brown discharge. Psychiatry: Judgement and insight appear normal. Mood & affect appropriate.     Data Reviewed: I have personally reviewed following labs and imaging studies  CBC:  Recent Labs Lab 03/30/17 0908 03/31/17 0520  WBC 9.8 10.5  NEUTROABS 6.5  --   HGB 10.8* 9.5*  HCT 33.2* 29.4*  MCV 79.2 78.6  PLT 356 546   Basic Metabolic Panel:  Recent Labs Lab 03/30/17 0908 03/31/17 0520  NA 137 137  K 4.4 3.9  CL 105 106  CO2 22 22  GLUCOSE 111* 84  BUN 27* 15  CREATININE 1.29* 0.83  CALCIUM 9.4 9.0   GFR: Estimated Creatinine Clearance: 44.1 mL/min (by C-G formula based on SCr of 0.83 mg/dL). Liver Function Tests: No results for input(s): AST,  ALT, ALKPHOS, BILITOT, PROT, ALBUMIN in the last 168 hours. No results for input(s): LIPASE, AMYLASE in the last 168 hours. No results for input(s): AMMONIA in the last 168 hours. Coagulation Profile:  Recent Labs Lab 03/30/17 0908 03/30/17 1944  INR 1.05 1.07   Cardiac Enzymes: No results for input(s): CKTOTAL, CKMB, CKMBINDEX, TROPONINI in the last 168 hours. BNP (last 3 results) No results for input(s): PROBNP in the last 8760 hours. HbA1C: No results for input(s): HGBA1C in the last 72 hours. CBG: No results for input(s): GLUCAP in the last 168 hours. Lipid  Profile: No results for input(s): CHOL, HDL, LDLCALC, TRIG, CHOLHDL, LDLDIRECT in the last 72 hours. Thyroid Function Tests:  Recent Labs  03/30/17 1944  TSH 0.786   Anemia Panel: No results for input(s): VITAMINB12, FOLATE, FERRITIN, TIBC, IRON, RETICCTPCT in the last 72 hours. Sepsis Labs:  Recent Labs Lab 03/30/17 0924 03/30/17 1322  LATICACIDVEN 0.77 0.68    No results found for this or any previous visit (from the past 240 hour(s)).       Radiology Studies: Dg Tibia/fibula Left  Result Date: 03/30/2017 CLINICAL DATA:  Pt has hx of ulcer with gangrene in March and had surgery at [facility]. C/o bilateral lower leg and foot pain, EXAM: LEFT TIBIA AND FIBULA - 2 VIEW COMPARISON:  03/22/2017 FINDINGS: No fracture.  No bone lesion. There is no bone resorption to suggest osteomyelitis. Knee and ankle joints are normally spaced and aligned. No arthropathic change. There is a soft tissue ulcer that is most evident posteriorly, involving the distal leg and ankle. The normal Achilles tendon shadows non avid suggesting a disrupted Achilles tendon. There are several bubbles of soft tissue air projecting over the wound. IMPRESSION: 1. No fracture or bone lesion. 2. No evidence of osteomyelitis. Electronically Signed   By: Lajean Manes M.D.   On: 03/30/2017 10:44   Dg Tibia/fibula Right  Result Date: 03/30/2017 CLINICAL DATA:  Pt has hx of ulcer with gangrene in March and had surgery at [facility]. C/o bilateral lower leg and foot pain, EXAM: RIGHT TIBIA AND FIBULA - 2 VIEW COMPARISON:  03/22/2017 FINDINGS: No fracture.  No bone lesion. No bone resorption is seen to suggest osteomyelitis. The knee and ankle joints are normally spaced and aligned. There is a soft tissue wound is most apparent posteriorly, involving the distal leg and posterior ankle. This may disrupts the Achilles tendon. IMPRESSION: 1. No fracture, bone lesion or evidence of osteomyelitis. Electronically Signed   By: Lajean Manes M.D.    On: 03/30/2017 10:46   Dg Foot Complete Left  Result Date: 03/30/2017 CLINICAL DATA:  Pt has hx of ulcer with gangrene in March and had surgery at [facility]. C/o bilateral lower leg and foot pain. EXAM: LEFT FOOT - COMPLETE 3+ VIEW COMPARISON:  02/06/2017 and 12/22/2016 and 09/09/2016. FINDINGS: No fracture.  No bone lesion. There are periarticular erosions at the PIP joint of the second toe, new since 09/09/2016. This could reflect the sequelae of a septic arthropathy. It may be due to an inflammatory arthropathy. There is no current associated soft tissue swelling. There are no other areas of bone resorption. There is no convincing osteomyelitis. The other joints are normally spaced and aligned. There is a soft tissue wound over the dorsal aspect of the distal leg and ankle. Several bubbles of soft tissue air are noted. IMPRESSION: 1. No convincing osteomyelitis. 2. Erosive changes of the PIP joint of the second toe are likely chronic, but are  new since the study dated 09/09/2016 consistent with either the sequelae of an inflammatory arthropathy or septic arthritis. 3. Soft tissue ulcer over the dorsal aspect of the lower leg and ankle. Electronically Signed   By: Lajean Manes M.D.   On: 03/30/2017 10:43   Dg Foot Complete Right  Result Date: 03/30/2017 CLINICAL DATA:  Pt has hx of ulcer with gangrene in March and had surgery at [facility]. C/o bilateral lower leg and foot pain, EXAM: RIGHT FOOT COMPLETE - 3+ VIEW COMPARISON:  Right foot MRI, 02/06/2017 FINDINGS: There is marked joint space narrowing with subchondral erosions and mild ordering sclerosis at the first metatarsophalangeal joint. The proximal phalanx of the great toe has subluxed laterally in relation to the first metatarsal head. These findings are similar to the prior MRI. The remaining joints are normally spaced and aligned. No other arthropathic change. No soft tissue swelling is evident. There is a small focus of subcutaneous air adjacent to the  medial first metatarsal head. IMPRESSION: 1. Advanced arthropathic changes of the first metatarsophalangeal joint, which may reflect the sequelae of a septic arthropathy or inflammatory arthropathy. 2. Small focus of subcutaneous air adjacent to the medial first metatarsal head. 3. No other abnormalities. Electronically Signed   By: Lajean Manes M.D.   On: 03/30/2017 11:13        Scheduled Meds: . irbesartan  300 mg Oral Daily   And  . amLODipine  10 mg Oral Daily   And  . hydrochlorothiazide  12.5 mg Oral Daily  . collagenase   Topical TID  . heparin  5,000 Units Subcutaneous Q8H  . metroNIDAZOLE   Topical BID  . sodium chloride flush  3 mL Intravenous Q12H   Continuous Infusions: . sodium chloride 100 mL/hr at 03/31/17 1013  . ampicillin-sulbactam (UNASYN) IV 1.5 g (03/31/17 1013)     LOS: 0 days     Cordelia Poche, MD [facility] 03/31/2017, 10:27 AM Pager: [phone]  If 7PM-7AM, please contact night-coverage [email or web address] Password [facility] 03/31/2017, 10:27 AM

## 2017-03-31 NOTE — Progress Notes (Signed)
Pharmacy Antibiotic Note  [name] is a 56 y.o. female admitted on 03/30/2017 with necrotic, odorous, painful lower extremity wound infection. Pharmacy has been consulted for Vancomycin and Unasyn dosing.  Today, 03/31/2017: D2 abx Afebrile WBC 10.5 SCr significantly improved to 0.83, CrCl ~ 44 ml/min   Plan: Increase to Unasyn 1.5g IV q6h. Vancomycin 750 IV q24h. Measure Vanc trough at steady state. Follow up renal fxn, culture results, and clinical course.   Height: 5' 1.5" (156.2 cm) Weight: 78 lb 6.4 oz (35.6 kg) IBW/kg (Calculated) : 48.95  Temp (24hrs), Avg:98.7 F (37.1 C), Min:98.3 F (36.8 C), Max:99.3 F (37.4 C)   Recent Labs Lab 03/30/17 0908 03/30/17 0924 03/30/17 1322 03/31/17 0520  WBC 9.8  --   --  10.5  CREATININE 1.29*  --   --  0.83  LATICACIDVEN  --  0.77 0.68  --     Estimated Creatinine Clearance: 44.1 mL/min (by C-G formula based on SCr of 0.83 mg/dL).    Allergies  Allergen Reactions  . Acetaminophen Swelling and Other (See Comments)    Reaction:  Eyelid swelling  . Other Other (See Comments)    Pt states she is allergic to a blood pressure medication, but does not know the reaction or medication    Antimicrobials this admission: 6/26 >> Ceftriaxone x 1 6/26 >> Vancomycin >> 6/26 >> Unasyn >>  Dose adjustments this admission: 6/27 Random vanc level (~24 hrs after Vanc 1g x1 dose) = 7  Microbiology results: 6/26 BCx: sent   Thank you for allowing pharmacy to be a part of this patient's care.   Gretta Arab PharmD, BCPS Pager [phone] 03/31/2017 9:26 PM

## 2017-03-31 NOTE — Consult Note (Signed)
[facility] Nurse wound consult note Reason for Consult:Wounds BLE Wound type:Full thickness, Levamisole induced vasculitis Pressure Injury POA: NA Measurement: Dorsal surface of  Left great toe 2cm x 1.25cm 0.1cm pale pink bed, scant drainage Dorsal surface of Right great toe 1cm x 2cm x 0.1cm pale pink bed, scant drainage Right posterior calf 12cm x 11cm x 1cm at deepest base, 10% pink healing scar tissue, 40% red non granulating  Tissue, 50% blackened necrotic area, moderate serosanguinous drainage, malodorous Left posterior calf 9cm x 10cm x 3cm 10% red non granulating tissue, 90% black stringy slough and tissue attached at some points, moderate serosanguinous drainage, malodorous Left medial ankle area has three wounds 2.5cm x 2.5cm x 0.1cm pale pink bed, slight drainage 2cm x 2cm x 0.1cm pale pink bed, slight drainage 2cm x 2cm x 0.1cm pale pink bed, slight drainage Wound bed: see above Drainage (amount, consistency, odor) see above Periwound: scar tissue from previous wounds Dressing procedure/placement/frequency: Patient insists on using non adherent for wounds.  Explained purpose of NS moist to dry to pull dead tissue off, pt refusing. Therefore I have provided nurses with orders for To BLE wounds, (including dorsal surface of both great toes, cleanse with NS, pat dry, cover wounds with telfa, place ABD, wrap with Kerlix, adhere with tape, perform BID. Patient has been seen by Ortho in the past who states she is not a candidate due to continued Cocaine use. We will not follow, but will remain available to this patient, to nursing, and the medical and/or surgical teams.  Please re-consult if we need to assist further.   Fara Olden, RN-C, WTA-C Wound Treatment Associate

## 2017-03-31 NOTE — Progress Notes (Signed)
Initial Nutrition Assessment  DOCUMENTATION CODES:   Severe malnutrition in context of chronic illness  INTERVENTION:   Juven Fruit Punch BID, each serving provides 80kcal and 14g of protein (amino acids glutamine and arginine)  Magic cup TID with meals, each supplement provides 290 kcal and 9 grams of protein  Boost Breeze po TID, each supplement provides 250 kcal and 9 grams of protein  MVI  Snacks  NUTRITION DIAGNOSIS:   Malnutrition (severe) related to chronic illness (drug abuse, non healing wounds) as evidenced by 13 percent weight loss in 2 months, severe depletion of body fat, severe depletion of muscle mass.  GOAL:   Patient will meet greater than or equal to 90% of their needs  MONITOR:   PO intake, Supplement acceptance, Labs, Weight trends, Skin  REASON FOR ASSESSMENT:   Malnutrition Screening Tool    ASSESSMENT:   56 y.o. female with medical history significant of chronic bilateral lower extremity wound, persistent cocaine use, hypertension and no tears to medical therapy.    Met with pt in room today. Pt reports intermittent poor appetite pta; RD suspects this is related to drug abuse. Per chart, pt with 12lb(13%) weight loss in 2 months; this is significant. Pt currently eating 100% of meals. RD discussed with pt the importance of adequate nutrition for wound healing. Pt is willing to try any supplements but she really likes Boost Breeze. Adequate protein intake will be critical for wound healing.   Medications reviewed and include: heparin, metronidazole, unasyn, hydrocodone  Labs reviewed: Hgb 9.5(L), Hct 29.4(L)  Nutrition-Focused physical exam completed. Findings are severe fat depletion in arms and chest, severe muscle depletion over entire body, and no edema.   Diet Order:  Diet regular Room service appropriate? Yes; Fluid consistency: Thin  Skin:  Reviewed, no issues  Last BM:  none since admit  Height:   Ht Readings from Last 1 Encounters:   03/30/17 5' 1.5" (1.562 m)    Weight:   Wt Readings from Last 1 Encounters:  03/30/17 78 lb 6.4 oz (35.6 kg)    Ideal Body Weight:  48.9 kg  BMI:  Body mass index is 14.57 kg/m.  Estimated Nutritional Needs:   Kcal:  1250-1450kcal/day   Protein:  71-88g/day   Fluid:  >1.2L/day   EDUCATION NEEDS:   Education needs addressed  Koleen Distance MS, RD, LDN Pager #[phone] After Hours Pager: [phone]

## 2017-03-31 NOTE — Progress Notes (Signed)
Correction:[facility] is not established, but they will follow the patient @ d/c.

## 2017-03-31 NOTE — Care Management Note (Signed)
Case Management Note  Patient Details  Name: [name] MRN: [id number] Date of Birth: Aug 28, 1961  Subjective/Objective:  56 y/o f admitted w/wound infection. Hx: Cocaine use. From [place]. No current PCP-dismissed from [facility] in May 2018. Will have [place] to check for [facility] pcp appt.. She is established w/[facility]-rep [place] already following-patient not answering their calls. Covered-Augmentin to be substituted for Amoxicillin-clavulanate tab, doxycycline(covered).                   Action/Plan:d/c plan return back to [place] home.   Expected Discharge Date:   (unknown)               Expected Discharge Plan:  Home/Self Care  In-House Referral:     Discharge planning Services  CM Consult, [facility]  Post Acute Care Choice:    Choice offered to:     DME Arranged:    DME Agency:     HH Arranged:    HH Agency:     Status of Service:  In process, will continue to follow  If discussed at Long Length of Stay Meetings, dates discussed:    Additional Comments:  Dessa Phi, RN 03/31/2017, 2:57 PM

## 2017-04-01 DIAGNOSIS — F332 Major depressive disorder, recurrent severe without psychotic features: Secondary | ICD-10-CM

## 2017-04-01 LAB — CBC
HEMATOCRIT: 25.8 % — AB (ref 36.0–46.0)
Hemoglobin: 8.6 g/dL — ABNORMAL LOW (ref 12.0–15.0)
MCH: 25.7 pg — AB (ref 26.0–34.0)
MCHC: 33.3 g/dL (ref 30.0–36.0)
MCV: 77.2 fL — ABNORMAL LOW (ref 78.0–100.0)
Platelets: 358 10*3/uL (ref 150–400)
RBC: 3.34 MIL/uL — ABNORMAL LOW (ref 3.87–5.11)
RDW: 15.7 % — AB (ref 11.5–15.5)
WBC: 5.8 10*3/uL (ref 4.0–10.5)

## 2017-04-01 LAB — BASIC METABOLIC PANEL
Anion gap: 9 (ref 5–15)
BUN: 17 mg/dL (ref 6–20)
CALCIUM: 8.5 mg/dL — AB (ref 8.9–10.3)
CO2: 24 mmol/L (ref 22–32)
Chloride: 107 mmol/L (ref 101–111)
Creatinine, Ser: 0.86 mg/dL (ref 0.44–1.00)
GFR calc Af Amer: >60 mL/min (ref >60)
GLUCOSE: 101 mg/dL — AB (ref 65–99)
Potassium: 3.3 mmol/L — ABNORMAL LOW (ref 3.5–5.1)
Sodium: 140 mmol/L (ref 135–145)

## 2017-04-01 LAB — HEMOGLOBIN A1C
HEMOGLOBIN A1C: 5.5 % (ref 4.8–5.6)
Mean Plasma Glucose: 111 mg/dL

## 2017-04-01 MED ORDER — DOXYCYCLINE HYCLATE 100 MG PO TABS
100.0000 mg | ORAL_TABLET | Freq: Two times a day (BID) | ORAL | Status: DC
  Administered 2017-04-01: 100 mg via ORAL
  Filled 2017-04-01: qty 1

## 2017-04-01 MED ORDER — OLMESARTAN-AMLODIPINE-HCTZ 40-10-12.5 MG PO TABS: ORAL_TABLET | ORAL | 0 refills | Status: DC

## 2017-04-01 MED ORDER — AMOXICILLIN-POT CLAVULANATE 500-125 MG PO TABS
1.0000 | ORAL_TABLET | Freq: Three times a day (TID) | ORAL | Status: DC
  Administered 2017-04-01: 500 mg via ORAL
  Filled 2017-04-01 (×3): qty 1

## 2017-04-01 MED ORDER — ADULT MULTIVITAMIN W/MINERALS CH
1.0000 | ORAL_TABLET | Freq: Every day | ORAL | Status: DC
  Administered 2017-04-01: 1 via ORAL
  Filled 2017-04-01: qty 1

## 2017-04-01 MED ORDER — AMOXICILLIN-POT CLAVULANATE 500-125 MG PO TABS: 1.0000 | ORAL_TABLET | Freq: Three times a day (TID) | ORAL | 0 refills | Status: DC

## 2017-04-01 MED ORDER — DOXYCYCLINE HYCLATE 100 MG PO TABS: 100.0000 mg | ORAL_TABLET | Freq: Two times a day (BID) | ORAL | 0 refills | Status: DC

## 2017-04-01 NOTE — Discharge Instructions (Signed)
[name],  You were admitted because of an infection of your leg wound. You were prescribed antibiotics. No evidence of infection in your blood at this time. X-rays did not show evidence of bone infection. You will be discharged with antibiotics to take. Please complete the course of your antibiotics.

## 2017-04-01 NOTE — Care Management Note (Signed)
Case Management Note  Patient Details  Name: CAMARIA GERALD MRN: [id number] Date of Birth: [birth year]  Subjective/Objective: [facility]cp appt made-patient has missed several scheduled appts in the past; [facility] [place] [facility] has dismissed her in May 2018. No further CM needs.                   Action/Plan:d/c back to boarding house.   Expected Discharge Date:   (unknown)               Expected Discharge Plan:  Home/Self Care  In-House Referral:     Discharge planning Services  CM Consult, [facility]  Post Acute Care Choice:    Choice offered to:     DME Arranged:    DME Agency:     HH Arranged:    HH Agency:     Status of Service:  Completed, signed off  If discussed at [employer] of Stay Meetings, dates discussed:    Additional Comments:  Dessa Phi, RN 04/01/2017, 11:11 AM

## 2017-04-01 NOTE — Progress Notes (Signed)
Dressings changed per would care recommendations.

## 2017-04-01 NOTE — Discharge Summary (Signed)
Physician Discharge Summary  [name]:[id number] DOB: 1961/07/14 DOA: 03/30/2017  PCP: Patient, No Pcp Per  Admit date: 03/30/2017 Discharge date: 04/01/2017  Admitted From: Home Disposition: Home  Recommendations for Outpatient Follow-up:  1. Follow up with PCP in 1-2 weeks 2. Follow up with wound care 3. Patient needs continued counseling on cocaine use cessation 4. Adjust antihypertensives to obtain adequately controlled blood pressure   Discharge Condition: Stable CODE STATUS: Full code Diet recommendation: Heart healthy   Brief/Interim Summary:  Admission HPI written by Verlee Monte, MD   Chief Complaint: Bilateral lower extremity pain  HPI: [name] is a 56 y.o. female with medical history significant of chronic bilateral lower extremity wound, persistent cocaine use, hypertension and no tears to medical therapy. She presented to the hospital complaining about worsening of pain in her legs, she had one of the worst orders will have smelled in a wound recently, the wound is blackened and appears to be a chronic (please review the pictures attached to the H&P) although the wound appeared blackened and necrotic she does not have fever or leukocytosis. Patient admitted to the hospital for further evaluation of her wounds.  ED Course:  Vitals: Blood pressure elevated Labs: WNL creatinine 1.29. Imaging: N/A Interventions: Started on Rocephin    Hospital course:  Chronic wound Wound infection Appears acutely infected. No leukocytosis or fevers. Given Vancomycin and ceftriaxone in ED And then started on Unasyn. Discussed antibiotic regimen with infectious disease recommended Augmentin and doxycycline for discharge. Wound care came and assessed patient. Patient to follow-up with wound care as an outpatient. Patient not a candidate for surgery secondary to continued cocaine use.  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 Continue antihypertensives  Protein-calorie malnutrition, severe Dietitian recommendations: Boost breeze TID, magic cup TID, fruit punch BID  Acute kidney injury Resolved with IV fluids.  Cocaine use Patient counseled. Not interested in resources at this time.  Discharge Diagnoses:  Principal Problem:   Wound infection Active Problems:   Cocaine-induced vascular disorder (HCC)   Cocaine use disorder, severe, dependence (HCC)   MDD (major depressive disorder), recurrent episode, severe (HCC)   Protein-calorie malnutrition, severe (HCC)   Ulcer with gangrene, with necrosis of muscle [facility])    Discharge Instructions  Discharge Instructions    Call MD for:  difficulty breathing, headache or visual disturbances    Complete by:  As directed    Call MD for:  persistant nausea and vomiting    Complete by:  As directed    Call MD for:  temperature >100.4    Complete by:  As directed    Diet - low sodium heart healthy    Complete by:  As directed    Increase activity slowly    Complete by:  As directed      Allergies as of 04/01/2017      Reactions   Acetaminophen Swelling, Other (See Comments)   Reaction:  Eyelid swelling   Other Other (See Comments)   Pt states she is allergic to a blood pressure medication, but does not know the reaction or medication      Medication List    STOP taking these medications   Oxycodone HCl 10 MG Tabs     TAKE these medications   amoxicillin-clavulanate 500-125 MG tablet Commonly known as:  AUGMENTIN Take 1 tablet (500 mg total) by mouth 3 (three) times daily.   collagenase ointment Commonly known as:  Corporate treasurer  topically daily. Notes to patient:  Apply to legs twice a day with dressing changes    doxycycline 100 MG tablet Commonly known as:  VIBRA-TABS Take 1 tablet (100 mg total) by mouth every 12 (twelve) hours.   feeding supplement Liqd Take 1 Container by mouth 3 (three) times daily between  meals.   ferrous sulfate 325 (65 FE) MG tablet Take 1 tablet (325 mg total) by mouth 2 (two) times daily with a meal.   ibuprofen 200 MG tablet Commonly known as:  ADVIL,MOTRIN Take 800 mg by mouth every 6 (six) hours as needed for moderate pain. What changed:  Another medication with the same name was removed. Continue taking this medication, and follow the directions you see here.   multivitamins with iron Tabs tablet Take 1 tablet by mouth daily.   mupirocin cream 2 % Commonly known as:  BACTROBAN Apply topically daily.   Olmesartan-Amlodipine-HCTZ 40-10-12.5 MG Tabs Commonly known as:  TRIBENZOR Take 1 tablet by mouth daily.   senna 8.6 MG Tabs tablet Commonly known as:  SENOKOT Take 1 tablet (8.6 mg total) by mouth daily as needed for mild constipation.      Follow-up Information    [facility] Follow up on 04/08/2017.   Why:   @ 8:30a.tel#[phone]; please go to appt. Bring insurance card,& meds. Contact information: [place] [phone]       Part[facility] for [facility] Follow up.   Why:  They will follow you for additional resources-please allow them to contact you.         Allergies  Allergen Reactions  . Acetaminophen Swelling and Other (See Comments)    Reaction:  Eyelid swelling  . Other Other (See Comments)    Pt states she is allergic to a blood pressure medication, but does not know the reaction or medication    Consultations:  Infectious disease (telephone)   Procedures/Studies: Dg Tibia/fibula Left  Result Date: 03/30/2017 CLINICAL DATA:  Pt has hx of ulcer with gangrene in March and had surgery at [facility]. C/o bilateral lower leg and foot pain, EXAM: LEFT TIBIA AND FIBULA - 2 VIEW COMPARISON:  03/22/2017 FINDINGS: No fracture.  No bone lesion. There is no bone resorption to suggest osteomyelitis. Knee and ankle joints are normally spaced and aligned. No arthropathic change. There is a soft  tissue ulcer that is most evident posteriorly, involving the distal leg and ankle. The normal Achilles tendon shadows non avid suggesting a disrupted Achilles tendon. There are several bubbles of soft tissue air projecting over the wound. IMPRESSION: 1. No fracture or bone lesion. 2. No evidence of osteomyelitis. Electronically Signed   By: Lajean Manes M.D.   On: 03/30/2017 10:44   Dg Tibia/fibula Left  Result Date: 03/22/2017 CLINICAL DATA:  Bilateral open and infected appearing leg wounds EXAM: LEFT TIBIA AND FIBULA - 2 VIEW COMPARISON:  Left tibia and fibula of Feb 06, 2017 FINDINGS: The bones are subjectively adequately mineralized. There is no lytic or blastic lesion or periosteal reaction. There are deep ulcers noted posteriorly and laterally over the distal leg. The observed portions of the knee and ankle are normal. IMPRESSION: Soft tissue ulceration a similar to that seen previously. No radiographic evidence of acute osteomyelitis. As mentioned on the previous study, MRI would be more sensitive for detection of early changes of osteomyelitis. Electronically Signed   By: David  Martinique M.D.   On: 03/22/2017 07:07   Dg  Tibia/fibula Right  Result Date: 03/30/2017 CLINICAL DATA:  Pt has hx of ulcer with gangrene in March and had surgery at [facility]. C/o bilateral lower leg and foot pain, EXAM: RIGHT TIBIA AND FIBULA - 2 VIEW COMPARISON:  03/22/2017 FINDINGS: No fracture.  No bone lesion. No bone resorption is seen to suggest osteomyelitis. The knee and ankle joints are normally spaced and aligned. There is a soft tissue wound is most apparent posteriorly, involving the distal leg and posterior ankle. This may disrupts the Achilles tendon. IMPRESSION: 1. No fracture, bone lesion or evidence of osteomyelitis. Electronically Signed   By: Lajean Manes M.D.   On: 03/30/2017 10:46   Dg Tibia/fibula Right  Result Date: 03/22/2017 CLINICAL DATA:  Bilateral open leg wounds. EXAM: RIGHT TIBIA AND FIBULA - 2 VIEW  COMPARISON:  Right tibia and fibula of Feb 06, 2017 FINDINGS: The bones are subjectively adequately mineralized. Suspected osteolysis involving the proximal aspect of the medial malleolus seen previously is not evident today. There is subtle periosteal reaction along the posterior aspect of the distal tibia. The ankle joint mortise is preserved. The knee exhibits no acute abnormality. There are deep soft tissue wounds over the posterior 1/3 of the leg. IMPRESSION: Subtle periosteal reaction along the posterior aspect of the lower third of the diaphysis of the fibula. The medial malleolar changes seen on the previous study are not evident today. Deep wounds over the posterior soft tissues of the leg. Electronically Signed   By: David  Martinique M.D.   On: 03/22/2017 07:10   Dg Foot Complete Left  Result Date: 03/30/2017 CLINICAL DATA:  Pt has hx of ulcer with gangrene in March and had surgery at [facility]. C/o bilateral lower leg and foot pain. EXAM: LEFT FOOT - COMPLETE 3+ VIEW COMPARISON:  02/06/2017 and 12/22/2016 and 09/09/2016. FINDINGS: No fracture.  No bone lesion. There are periarticular erosions at the PIP joint of the second toe, new since 09/09/2016. This could reflect the sequelae of a septic arthropathy. It may be due to an inflammatory arthropathy. There is no current associated soft tissue swelling. There are no other areas of bone resorption. There is no convincing osteomyelitis. The other joints are normally spaced and aligned. There is a soft tissue wound over the dorsal aspect of the distal leg and ankle. Several bubbles of soft tissue air are noted. IMPRESSION: 1. No convincing osteomyelitis. 2. Erosive changes of the PIP joint of the second toe are likely chronic, but are new since the study dated 09/09/2016 consistent with either the sequelae of an inflammatory arthropathy or septic arthritis. 3. Soft tissue ulcer over the dorsal aspect of the lower leg and ankle. Electronically Signed   By: Lajean Manes M.D.   On: 03/30/2017 10:43   Dg Foot Complete Right  Result Date: 03/30/2017 CLINICAL DATA:  Pt has hx of ulcer with gangrene in March and had surgery at [facility]. C/o bilateral lower leg and foot pain, EXAM: RIGHT FOOT COMPLETE - 3+ VIEW COMPARISON:  Right foot MRI, 02/06/2017 FINDINGS: There is marked joint space narrowing with subchondral erosions and mild ordering sclerosis at the first metatarsophalangeal joint. The proximal phalanx of the great toe has subluxed laterally in relation to the first metatarsal head. These findings are similar to the prior MRI. The remaining joints are normally spaced and aligned. No other arthropathic change. No soft tissue swelling is evident. There is a small focus of subcutaneous air adjacent to the medial first metatarsal head. IMPRESSION: 1. Advanced arthropathic  changes of the first metatarsophalangeal joint, which may reflect the sequelae of a septic arthropathy or inflammatory arthropathy. 2. Small focus of subcutaneous air adjacent to the medial first metatarsal head. 3. No other abnormalities. Electronically Signed   By: Lajean Manes M.D.   On: 03/30/2017 11:13      Subjective: Afebrile overnight.  Discharge Exam: Vitals:   04/01/17 0532 04/01/17 1008  BP: (!) 144/80 (!) 175/83  Pulse: 81 (!) 103  Resp: 18 20  Temp: 98.5 F (36.9 C) 98.5 F (36.9 C)   Vitals:   03/31/17 1459 03/31/17 2213 04/01/17 0532 04/01/17 1008  BP: (!) 150/73 (!) 106/58 (!) 144/80 (!) 175/83  Pulse: (!) 104 96 81 (!) 103  Resp: 20 20 18 20   Temp: 98.6 F (37 C) 98.8 F (37.1 C) 98.5 F (36.9 C) 98.5 F (36.9 C)  TempSrc: Oral Oral Oral Oral  SpO2: 100% 100% 100% 100%  Weight:      Height:        General: Pt is alert, awake, not in acute distress Cardiovascular: RRR, S1/S2 +, no rubs, no gallops Respiratory: CTA bilaterally, no wheezing, no rhonchi Abdominal: Soft, NT, ND, bowel sounds + Extremities: no edema, no cyanosis Skin: wounds wrapped in gauze.  Admission photos provided for reference            The results of significant diagnostics from this hospitalization (including imaging, microbiology, ancillary and laboratory) are listed below for reference.     Microbiology: Recent Results (from the past 240 hour(s))  Blood culture (routine x 2)     Status: None (Preliminary result)   Collection Time: 03/30/17  9:08 AM  Result Value Ref Range Status   Specimen Description BLOOD RIGHT ANTECUBITAL  Final   Special Requests IN PEDIATRIC BOTTLE Blood Culture adequate volume  Final   Culture   Final    NO GROWTH 1 DAY Performed at [facility], [place] [address] [place], [place] [address]    Report Status PENDING  Incomplete  MRSA PCR Screening     Status: None   Collection Time: 03/31/17  3:17 PM  Result Value Ref Range Status   MRSA by PCR NEGATIVE NEGATIVE Final    Comment:        The GeneXpert MRSA Assay (FDA approved for NASAL specimens only), is one component of a comprehensive MRSA colonization surveillance program. It is not intended to diagnose MRSA infection nor to guide or monitor treatment for MRSA infections.      Labs: BNP (last 3 results) No results for input(s): BNP in the last 8760 hours. Basic Metabolic Panel:  Recent Labs Lab 03/30/17 0908 03/31/17 0520 04/01/17 0541  NA 137 137 140  K 4.4 3.9 3.3*  CL 105 106 107  CO2 22 22 24   GLUCOSE 111* 84 101*  BUN 27* 15 17  CREATININE 1.29* 0.83 0.86  CALCIUM 9.4 9.0 8.5*   Liver Function Tests: No results for input(s): AST, ALT, ALKPHOS, BILITOT, PROT, ALBUMIN in the last 168 hours. No results for input(s): LIPASE, AMYLASE in the last 168 hours. No results for input(s): AMMONIA in the last 168 hours. CBC:  Recent Labs Lab 03/30/17 0908 03/31/17 0520 04/01/17 0541  WBC 9.8 10.5 5.8  NEUTROABS 6.5  --   --   HGB 10.8* 9.5* 8.6*  HCT 33.2* 29.4* 25.8*  MCV 79.2 78.6 77.2*  PLT 356 352 358   Cardiac Enzymes: No results for  input(s): CKTOTAL, CKMB, CKMBINDEX, TROPONINI in  the last 168 hours. BNP: Invalid input(s): POCBNP CBG: No results for input(s): GLUCAP in the last 168 hours. D-Dimer No results for input(s): DDIMER in the last 72 hours. Hgb A1c  Recent Labs  03/30/17 1944  HGBA1C 5.5   Lipid Profile No results for input(s): CHOL, HDL, LDLCALC, TRIG, CHOLHDL, LDLDIRECT in the last 72 hours. Thyroid function studies  Recent Labs  03/30/17 1944  TSH 0.786   Anemia work up No results for input(s): VITAMINB12, FOLATE, FERRITIN, TIBC, IRON, RETICCTPCT in the last 72 hours. Urinalysis    Component Value Date/Time   COLORURINE YELLOW 02/06/2017 0615   APPEARANCEUR HAZY (A) 02/06/2017 0615   LABSPEC 1.021 02/06/2017 0615   PHURINE 5.0 02/06/2017 0615   GLUCOSEU NEGATIVE 02/06/2017 0615   HGBUR SMALL (A) 02/06/2017 0615   BILIRUBINUR NEGATIVE 02/06/2017 0615   BILIRUBINUR small 04/29/2015 1533   KETONESUR NEGATIVE 02/06/2017 0615   PROTEINUR 100 (A) 02/06/2017 0615   UROBILINOGEN 1.0 06/14/2015 2234   NITRITE NEGATIVE 02/06/2017 0615   LEUKOCYTESUR NEGATIVE 02/06/2017 0615   Sepsis Labs Invalid input(s): PROCALCITONIN,  WBC,  LACTICIDVEN Microbiology Recent Results (from the past 240 hour(s))  Blood culture (routine x 2)     Status: None (Preliminary result)   Collection Time: 03/30/17  9:08 AM  Result Value Ref Range Status   Specimen Description BLOOD RIGHT ANTECUBITAL  Final   Special Requests IN PEDIATRIC BOTTLE Blood Culture adequate volume  Final   Culture   Final    NO GROWTH 1 DAY Performed at [facility], [place] [address], [place], [place] [address]    Report Status PENDING  Incomplete  MRSA PCR Screening     Status: None   Collection Time: 03/31/17  3:17 PM  Result Value Ref Range Status   MRSA by PCR NEGATIVE NEGATIVE Final    Comment:        The GeneXpert MRSA Assay (FDA approved for NASAL specimens only), is one component of a comprehensive MRSA  colonization surveillance program. It is not intended to diagnose MRSA infection nor to guide or monitor treatment for MRSA infections.      Time coordinating discharge: Over 30 minutes  SIGNED:   Cordelia Poche, MD [facility] 04/01/2017, 12:41 PM Pager [phone]  If 7PM-7AM, please contact night-coverage [email or web address] Password [email or web address]

## 2017-04-04 LAB — CULTURE, BLOOD (ROUTINE X 2)
CULTURE: NO GROWTH
SPECIAL REQUESTS: ADEQUATE

## 2017-04-04 IMAGING — DX DG CHEST 2V
2 series · 2 of 2 positions shown · non-contrast
Comparison: 04/08/2015

CLINICAL DATA: Productive cough

EXAM:
CHEST  2 VIEW

[chest pa]
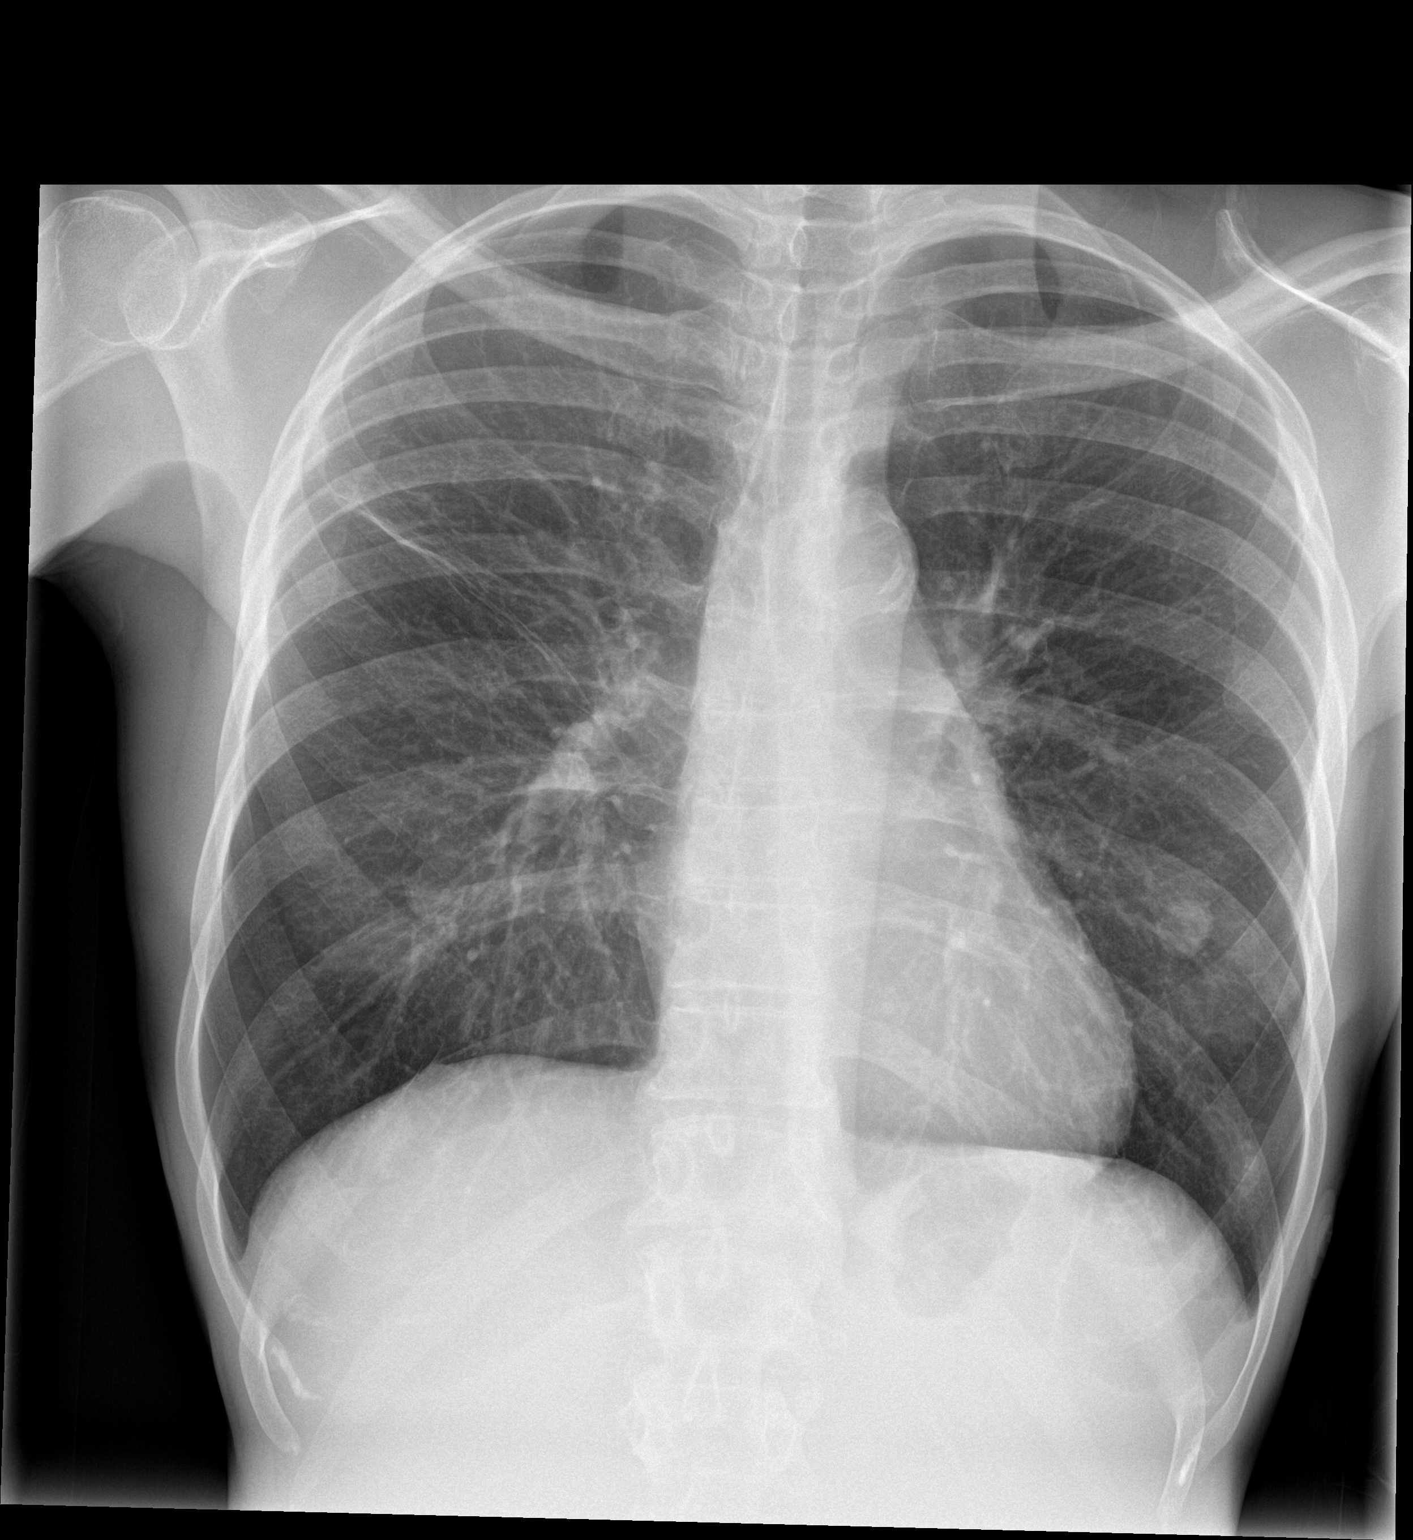

[chest lat]
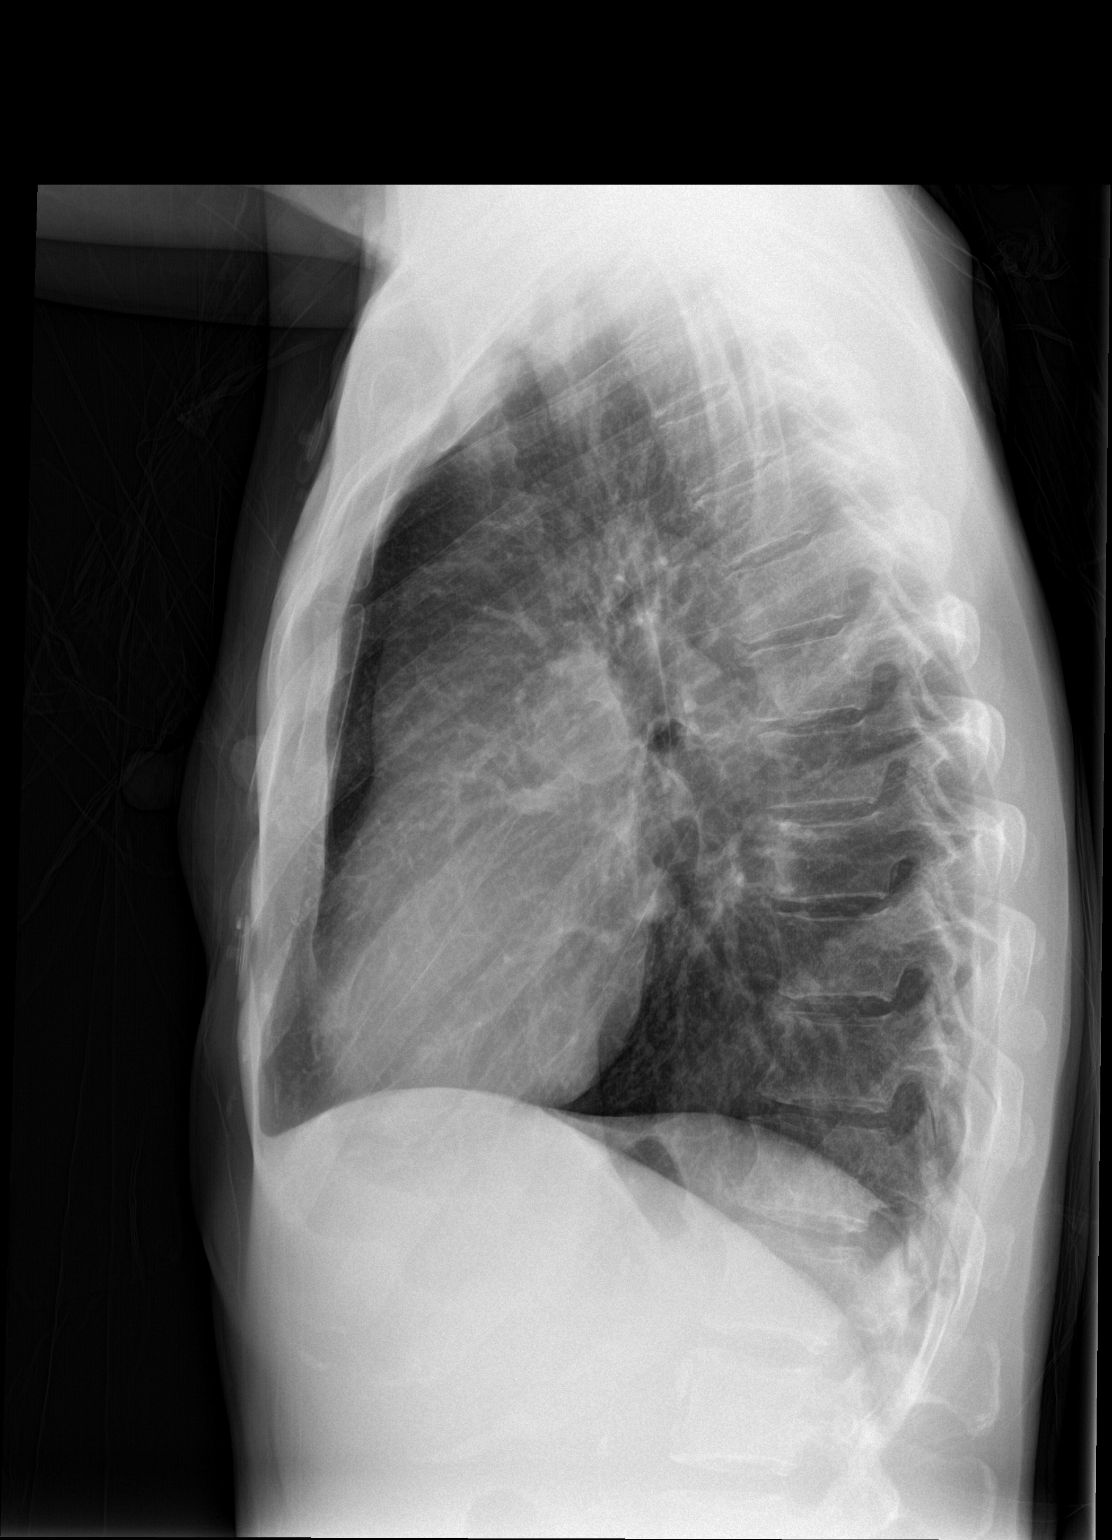

[2 of 2 positions shown; findings below may reference images not displayed]

FINDINGS: Cardiomediastinal silhouette is stable. Mild hyperinflation. Nodular
nipple shadow is noted bilaterally. No acute infiltrate or pulmonary
edema. Stable linear scarring in right upper lobe.
IMPRESSION: No active cardiopulmonary disease. Mild hyperinflation. Nodular
nipple shadow is noted bilaterally.

## 2017-04-05 LAB — CULTURE, BLOOD (ROUTINE X 2)
CULTURE: NO GROWTH
Culture: NO GROWTH
Special Requests: ADEQUATE
Special Requests: ADEQUATE

## 2017-04-08 ENCOUNTER — Emergency Department (HOSPITAL_COMMUNITY): Payer: Medicaid Other

## 2017-04-08 ENCOUNTER — Ambulatory Visit: Payer: Self-pay | Admitting: Family Medicine

## 2017-04-08 ENCOUNTER — Encounter (HOSPITAL_COMMUNITY): Payer: Self-pay | Admitting: Emergency Medicine

## 2017-04-08 ENCOUNTER — Inpatient Hospital Stay (HOSPITAL_COMMUNITY)
Admission: EM | Admit: 2017-04-08 | Discharge: 2017-04-13 | DRG: 239 | Disposition: A | Discharge status: Rehabilitation Facility | Payer: Medicaid Other | Attending: Internal Medicine | Admitting: Internal Medicine

## 2017-04-08 DIAGNOSIS — Z811 Family history of alcohol abuse and dependence: Secondary | ICD-10-CM

## 2017-04-08 DIAGNOSIS — M069 Rheumatoid arthritis, unspecified: Secondary | ICD-10-CM | POA: Diagnosis present

## 2017-04-08 DIAGNOSIS — Z8701 Personal history of pneumonia (recurrent): Secondary | ICD-10-CM

## 2017-04-08 DIAGNOSIS — L97903 Non-pressure chronic ulcer of unspecified part of unspecified lower leg with necrosis of muscle: Secondary | ICD-10-CM | POA: Diagnosis present

## 2017-04-08 DIAGNOSIS — E860 Dehydration: Secondary | ICD-10-CM | POA: Diagnosis present

## 2017-04-08 DIAGNOSIS — Z803 Family history of malignant neoplasm of breast: Secondary | ICD-10-CM

## 2017-04-08 DIAGNOSIS — L97223 Non-pressure chronic ulcer of left calf with necrosis of muscle: Secondary | ICD-10-CM | POA: Diagnosis present

## 2017-04-08 DIAGNOSIS — Z800 Family history of malignant neoplasm of digestive organs: Secondary | ICD-10-CM

## 2017-04-08 DIAGNOSIS — F1420 Cocaine dependence, uncomplicated: Secondary | ICD-10-CM | POA: Diagnosis present

## 2017-04-08 DIAGNOSIS — F1490 Cocaine use, unspecified, uncomplicated: Secondary | ICD-10-CM

## 2017-04-08 DIAGNOSIS — F329 Major depressive disorder, single episode, unspecified: Secondary | ICD-10-CM | POA: Diagnosis present

## 2017-04-08 DIAGNOSIS — L089 Local infection of the skin and subcutaneous tissue, unspecified: Secondary | ICD-10-CM

## 2017-04-08 DIAGNOSIS — F17210 Nicotine dependence, cigarettes, uncomplicated: Secondary | ICD-10-CM | POA: Diagnosis not present

## 2017-04-08 DIAGNOSIS — T148XXA Other injury of unspecified body region, initial encounter: Secondary | ICD-10-CM | POA: Diagnosis not present

## 2017-04-08 DIAGNOSIS — K5900 Constipation, unspecified: Secondary | ICD-10-CM | POA: Diagnosis present

## 2017-04-08 DIAGNOSIS — I70261 Atherosclerosis of native arteries of extremities with gangrene, right leg: Secondary | ICD-10-CM

## 2017-04-08 DIAGNOSIS — I70263 Atherosclerosis of native arteries of extremities with gangrene, bilateral legs: Secondary | ICD-10-CM

## 2017-04-08 DIAGNOSIS — G8929 Other chronic pain: Secondary | ICD-10-CM | POA: Diagnosis present

## 2017-04-08 DIAGNOSIS — Z9114 Patient's other noncompliance with medication regimen: Secondary | ICD-10-CM

## 2017-04-08 DIAGNOSIS — D62 Acute posthemorrhagic anemia: Secondary | ICD-10-CM | POA: Diagnosis not present

## 2017-04-08 DIAGNOSIS — F14988 Cocaine use, unspecified with other cocaine-induced disorder: Secondary | ICD-10-CM | POA: Diagnosis present

## 2017-04-08 DIAGNOSIS — Z713 Dietary counseling and surveillance: Secondary | ICD-10-CM

## 2017-04-08 DIAGNOSIS — I999 Unspecified disorder of circulatory system: Secondary | ICD-10-CM

## 2017-04-08 DIAGNOSIS — I10 Essential (primary) hypertension: Secondary | ICD-10-CM | POA: Diagnosis present

## 2017-04-08 DIAGNOSIS — Z720 Tobacco use: Secondary | ICD-10-CM

## 2017-04-08 DIAGNOSIS — Z560 Unemployment, unspecified: Secondary | ICD-10-CM

## 2017-04-08 DIAGNOSIS — Z681 Body mass index (BMI) 19 or less, adult: Secondary | ICD-10-CM

## 2017-04-08 DIAGNOSIS — Z9119 Patient's noncompliance with other medical treatment and regimen: Secondary | ICD-10-CM

## 2017-04-08 DIAGNOSIS — Z791 Long term (current) use of non-steroidal anti-inflammatories (NSAID): Secondary | ICD-10-CM

## 2017-04-08 DIAGNOSIS — Z8614 Personal history of Methicillin resistant Staphylococcus aureus infection: Secondary | ICD-10-CM

## 2017-04-08 DIAGNOSIS — M79609 Pain in unspecified limb: Secondary | ICD-10-CM

## 2017-04-08 DIAGNOSIS — Z886 Allergy status to analgesic agent status: Secondary | ICD-10-CM

## 2017-04-08 DIAGNOSIS — I776 Arteritis, unspecified: Secondary | ICD-10-CM | POA: Diagnosis present

## 2017-04-08 DIAGNOSIS — G8918 Other acute postprocedural pain: Secondary | ICD-10-CM

## 2017-04-08 DIAGNOSIS — N179 Acute kidney failure, unspecified: Secondary | ICD-10-CM | POA: Diagnosis present

## 2017-04-08 DIAGNOSIS — L97213 Non-pressure chronic ulcer of right calf with necrosis of muscle: Secondary | ICD-10-CM | POA: Diagnosis present

## 2017-04-08 DIAGNOSIS — I96 Gangrene, not elsewhere classified: Principal | ICD-10-CM | POA: Diagnosis present

## 2017-04-08 DIAGNOSIS — G546 Phantom limb syndrome with pain: Secondary | ICD-10-CM | POA: Diagnosis present

## 2017-04-08 DIAGNOSIS — E43 Unspecified severe protein-calorie malnutrition: Secondary | ICD-10-CM | POA: Diagnosis present

## 2017-04-08 LAB — CBC WITH DIFFERENTIAL/PLATELET
BASOS ABS: 0 10*3/uL (ref 0.0–0.1)
Basophils Relative: 0 %
Eosinophils Absolute: 0.3 10*3/uL (ref 0.0–0.7)
Eosinophils Relative: 3 %
HEMATOCRIT: 26.9 % — AB (ref 36.0–46.0)
Hemoglobin: 8.9 g/dL — ABNORMAL LOW (ref 12.0–15.0)
LYMPHS ABS: 2.4 10*3/uL (ref 0.7–4.0)
LYMPHS PCT: 24 %
MCH: 26 pg (ref 26.0–34.0)
MCHC: 33.1 g/dL (ref 30.0–36.0)
MCV: 78.7 fL (ref 78.0–100.0)
MONO ABS: 0.7 10*3/uL (ref 0.1–1.0)
Monocytes Relative: 7 %
NEUTROS ABS: 6.7 10*3/uL (ref 1.7–7.7)
Neutrophils Relative %: 66 %
Platelets: 513 10*3/uL — ABNORMAL HIGH (ref 150–400)
RBC: 3.42 MIL/uL — AB (ref 3.87–5.11)
RDW: 16.5 % — ABNORMAL HIGH (ref 11.5–15.5)
WBC: 10.1 10*3/uL (ref 4.0–10.5)

## 2017-04-08 LAB — SEDIMENTATION RATE: SED RATE: 104 mm/h — AB (ref 0–22)

## 2017-04-08 LAB — RAPID URINE DRUG SCREEN, HOSP PERFORMED
AMPHETAMINES: POSITIVE — AB
BARBITURATES: NOT DETECTED
BENZODIAZEPINES: NOT DETECTED
Cocaine: POSITIVE — AB
Opiates: NOT DETECTED
TETRAHYDROCANNABINOL: NOT DETECTED

## 2017-04-08 LAB — BASIC METABOLIC PANEL
Anion gap: 11 (ref 5–15)
BUN: 26 mg/dL — AB (ref 6–20)
CO2: 22 mmol/L (ref 22–32)
CREATININE: 2.25 mg/dL — AB (ref 0.44–1.00)
Calcium: 8.7 mg/dL — ABNORMAL LOW (ref 8.9–10.3)
Chloride: 107 mmol/L (ref 101–111)
GFR, EST AFRICAN AMERICAN: 27 mL/min — AB (ref >60)
GFR, EST NON AFRICAN AMERICAN: 24 mL/min — AB (ref >60)
Glucose, Bld: 103 mg/dL — ABNORMAL HIGH (ref 65–99)
POTASSIUM: 4 mmol/L (ref 3.5–5.1)
SODIUM: 140 mmol/L (ref 135–145)

## 2017-04-08 LAB — I-STAT CHEM 8, ED
BUN: 26 mg/dL — ABNORMAL HIGH (ref 6–20)
CHLORIDE: 107 mmol/L (ref 101–111)
CREATININE: 2.6 mg/dL — AB (ref 0.44–1.00)
Calcium, Ion: 1.17 mmol/L (ref 1.15–1.40)
GLUCOSE: 96 mg/dL (ref 65–99)
HCT: 26 % — ABNORMAL LOW (ref 36.0–46.0)
Hemoglobin: 8.8 g/dL — ABNORMAL LOW (ref 12.0–15.0)
POTASSIUM: 4 mmol/L (ref 3.5–5.1)
Sodium: 139 mmol/L (ref 135–145)
TCO2: 23 mmol/L (ref 0–100)

## 2017-04-08 LAB — CK: Total CK: 77 U/L (ref 38–234)

## 2017-04-08 LAB — C-REACTIVE PROTEIN: CRP: 2 mg/dL — AB (ref <1.0)

## 2017-04-08 LAB — I-STAT CG4 LACTIC ACID, ED: Lactic Acid, Venous: 0.52 mmol/L (ref 0.5–1.9)

## 2017-04-08 LAB — PREALBUMIN: Prealbumin: 13.6 mg/dL — ABNORMAL LOW (ref 18–38)

## 2017-04-08 MED ORDER — ACETAMINOPHEN 650 MG RE SUPP: 650.0000 mg | Freq: Four times a day (QID) | RECTAL | Status: DC | PRN

## 2017-04-08 MED ORDER — GABAPENTIN 100 MG PO CAPS
100.0000 mg | ORAL_CAPSULE | Freq: Three times a day (TID) | ORAL | Status: DC
  Administered 2017-04-08 – 2017-04-13 (×17): 100 mg via ORAL
  Filled 2017-04-08 (×18): qty 1

## 2017-04-08 MED ORDER — HALOPERIDOL LACTATE 5 MG/ML IJ SOLN
5.0000 mg | Freq: Once | INTRAMUSCULAR | Status: AC
  Administered 2017-04-08: 5 mg via INTRAVENOUS
  Filled 2017-04-08: qty 1

## 2017-04-08 MED ORDER — AMLODIPINE BESYLATE 5 MG PO TABS
5.0000 mg | ORAL_TABLET | Freq: Every day | ORAL | Status: DC
  Administered 2017-04-08 – 2017-04-13 (×6): 5 mg via ORAL
  Filled 2017-04-08 (×6): qty 1

## 2017-04-08 MED ORDER — ONDANSETRON HCL 4 MG PO TABS: 4.0000 mg | ORAL_TABLET | Freq: Four times a day (QID) | ORAL | Status: DC | PRN

## 2017-04-08 MED ORDER — PIPERACILLIN-TAZOBACTAM 3.375 G IVPB 30 MIN
3.3750 g | Freq: Once | INTRAVENOUS | Status: AC
  Administered 2017-04-08: 3.375 g via INTRAVENOUS
  Filled 2017-04-08: qty 50

## 2017-04-08 MED ORDER — MIRTAZAPINE 15 MG PO TABS
7.5000 mg | ORAL_TABLET | Freq: Every day | ORAL | Status: DC
  Administered 2017-04-08 – 2017-04-12 (×5): 7.5 mg via ORAL
  Filled 2017-04-08 (×5): qty 1

## 2017-04-08 MED ORDER — HYDROCODONE-ACETAMINOPHEN 5-325 MG PO TABS
1.0000 | ORAL_TABLET | ORAL | Status: DC | PRN
  Administered 2017-04-08 – 2017-04-13 (×6): 2 via ORAL
  Filled 2017-04-08 (×6): qty 2

## 2017-04-08 MED ORDER — ACETAMINOPHEN 325 MG PO TABS: 650.0000 mg | ORAL_TABLET | Freq: Four times a day (QID) | ORAL | Status: DC | PRN

## 2017-04-08 MED ORDER — FENTANYL CITRATE (PF) 100 MCG/2ML IJ SOLN
50.0000 ug | INTRAMUSCULAR | Status: DC | PRN
  Administered 2017-04-08: 50 ug via INTRAVENOUS
  Filled 2017-04-08: qty 2

## 2017-04-08 MED ORDER — FERROUS SULFATE 325 (65 FE) MG PO TABS
325.0000 mg | ORAL_TABLET | Freq: Two times a day (BID) | ORAL | Status: DC
  Administered 2017-04-08 – 2017-04-13 (×12): 325 mg via ORAL
  Filled 2017-04-08 (×12): qty 1

## 2017-04-08 MED ORDER — BUPROPION HCL ER (XL) 150 MG PO TB24
150.0000 mg | ORAL_TABLET | Freq: Every day | ORAL | Status: DC
  Administered 2017-04-08 – 2017-04-13 (×6): 150 mg via ORAL
  Filled 2017-04-08 (×6): qty 1

## 2017-04-08 MED ORDER — SODIUM CHLORIDE 0.9 % IV SOLN
INTRAVENOUS | Status: DC
  Administered 2017-04-08 – 2017-04-09 (×2): via INTRAVENOUS

## 2017-04-08 MED ORDER — SODIUM CHLORIDE 0.9 % IV BOLUS (SEPSIS)
1000.0000 mL | Freq: Once | INTRAVENOUS | Status: AC
  Administered 2017-04-08: 1000 mL via INTRAVENOUS

## 2017-04-08 MED ORDER — HEPARIN SODIUM (PORCINE) 5000 UNIT/ML IJ SOLN
5000.0000 [IU] | Freq: Three times a day (TID) | INTRAMUSCULAR | Status: DC
  Administered 2017-04-08 – 2017-04-13 (×15): 5000 [IU] via SUBCUTANEOUS
  Filled 2017-04-08 (×15): qty 1

## 2017-04-08 MED ORDER — SODIUM CHLORIDE 0.9 % IV SOLN
1.5000 g | Freq: Two times a day (BID) | INTRAVENOUS | Status: DC
  Administered 2017-04-08 (×2): 1.5 g via INTRAVENOUS
  Filled 2017-04-08 (×3): qty 1.5

## 2017-04-08 MED ORDER — HYDRALAZINE HCL 20 MG/ML IJ SOLN
10.0000 mg | INTRAMUSCULAR | Status: DC | PRN
  Administered 2017-04-08 – 2017-04-12 (×5): 10 mg via INTRAVENOUS
  Filled 2017-04-08 (×2): qty 0.5
  Filled 2017-04-08 (×2): qty 1

## 2017-04-08 MED ORDER — SODIUM CHLORIDE 0.9 % IV SOLN
INTRAVENOUS | Status: DC
  Administered 2017-04-08: 07:00:00 via INTRAVENOUS

## 2017-04-08 MED ORDER — ONDANSETRON HCL 4 MG/2ML IJ SOLN: 4.0000 mg | Freq: Four times a day (QID) | INTRAMUSCULAR | Status: DC | PRN

## 2017-04-08 MED ORDER — POLYETHYLENE GLYCOL 3350 17 G PO PACK: 17.0000 g | PACK | Freq: Every day | ORAL | Status: DC | PRN

## 2017-04-08 MED ORDER — SILVER SULFADIAZINE 1 % EX CREA
TOPICAL_CREAM | Freq: Every day | CUTANEOUS | Status: DC
  Administered 2017-04-08: 16:00:00 via TOPICAL
  Administered 2017-04-09: 1 via TOPICAL
  Filled 2017-04-08: qty 50

## 2017-04-08 MED ORDER — VANCOMYCIN HCL IN DEXTROSE 1-5 GM/200ML-% IV SOLN
1000.0000 mg | Freq: Once | INTRAVENOUS | Status: AC
  Administered 2017-04-08: 1000 mg via INTRAVENOUS
  Filled 2017-04-08: qty 200

## 2017-04-08 MED ORDER — HYDROXYZINE HCL 25 MG PO TABS: 25.0000 mg | ORAL_TABLET | Freq: Three times a day (TID) | ORAL | Status: DC | PRN

## 2017-04-08 MED ORDER — DOXYCYCLINE HYCLATE 100 MG IV SOLR
100.0000 mg | Freq: Two times a day (BID) | INTRAVENOUS | Status: DC
  Filled 2017-04-08: qty 100

## 2017-04-08 MED ORDER — ENSURE ENLIVE PO LIQD
237.0000 mL | Freq: Two times a day (BID) | ORAL | Status: DC
  Administered 2017-04-08 (×2): 237 mL via ORAL

## 2017-04-08 MED ORDER — METHOCARBAMOL 500 MG PO TABS
500.0000 mg | ORAL_TABLET | Freq: Three times a day (TID) | ORAL | Status: DC | PRN
  Administered 2017-04-08 – 2017-04-09 (×2): 500 mg via ORAL
  Filled 2017-04-08 (×2): qty 1

## 2017-04-08 NOTE — H&P (Signed)
[facility] History and Physical   Patient: [name] [id number]:[id number]   PCP: Patient, No Pcp Per DOB: [birth year]   DOA: 04/08/2017   DOS: 04/08/2017   DOS: the patient was seen and examined on 04/08/2017  Patient coming from: The patient is coming from home.  Chief Complaint: Foul swelling from the wound as well as pain in the leg  HPI: [name] is a 56 y.o. female with Past medical history of cocaine abuse, depression, hypertension, cocaine induced vasculitis with nonhealing wounds. Presents with 3 days history of foul-smelling wound as well as worsening pain in the leg. Denies having any fever or chills. Does mention that she did have some nausea as well as vomiting. One episode of vomiting was reported yesterday by patient. She also mentions he had 3 episodes of diarrhea yesterday. She did not take any the antibiotic since the time of the discharge she mentions ever making her sick. She continues to abuse cocaine and has used it 3 days ago per patient. Denies any alcohol abuse.  ED Course: Presented with foul-smelling wound, given IV antibiotics vancomycin and Zosyn, blood cultures performed. Patient was referred for admission.  At her baseline ambulates with support And is independent for most of her ADL; manages her medication on her own.  Review of Systems: as mentioned in the history of present illness.  All other systems reviewed and are negative.  Past Medical History:  Diagnosis Date  . CAP (community acquired pneumonia) 03/2014 X 2  . Cocaine abuse    ongoing with resultant vaculitis.  . Depression   . Headache    "weekly" (07/29/2016)  . Hypertension   . Inflammatory arthritis   . Migraines    "probably 5-6/yr" (07/29/2016)  . Normocytic anemia    BL Hgb 9.8-12. Last anemia panel 04/2010 - showing Fe 19, ferritin 101.  Pt on monthly B12 injections  . Rheumatoid arthritis(714.0)    patient reported  . 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 Followed by Dr. Louanne Skye   Past Surgical History:  Procedure Laterality Date  . AMPUTATION Left 05/22/2016   Procedure: AMPUTATION LEFT LONG FINGER;  Surgeon: Marybelle Killings, MD;  Location: [facility];  Service: Orthopedics;  Laterality: Left;  . HERNIA REPAIR     "stomach"  . I&D EXTREMITY Right 09/26/2015   Procedure: IRRIGATION AND DEBRIDEMENT LEG WOUND  VAC PLACEMENT.;  Surgeon: Loel Lofty Dillingham, DO;  Location: [facility];  Service: Plastics;  Laterality: Right;  . INCISION AND DRAINAGE OF WOUND Bilateral 10/20/2016   Procedure: IRRIGATION AND DEBRIDEMENT WOUND BILATERAL;  Surgeon: Edrick Kins, DPM;  Location: [place];  Service: Podiatry;  Laterality: Bilateral;  . IRRIGATION AND DEBRIDEMENT ABSCESS Bilateral 09/26/2013   Procedure: DEBRIDEMENT ULCERS BILATERAL THIGHS;  Surgeon: Gwenyth Ober, MD;  Location: [place];  Service: General;  Laterality: Bilateral;  . SKIN BIOPSY Bilateral    shin nodules   Social History:  reports that she has been smoking Cigarettes.  She has a 4.56 pack-year smoking history. She has never used smokeless tobacco. She reports that she uses drugs, including "Crack" cocaine and Cocaine. She reports that she does not drink alcohol.  Allergies  Allergen Reactions  . Acetaminophen Swelling and Other (See Comments)    Reaction:  Eyelid swelling  . Other Other (See Comments)    Pt states she is allergic to a blood pressure medication, but does not know the  reaction or medication    Family History  Problem Relation Age of Onset  . Breast cancer Mother        Breast cancer  . Alcohol abuse Mother   . Colon cancer Maternal Aunt 13  . Alcohol abuse Father      Prior to Admission medications   Medication Sig Start Date End Date Taking? Authorizing Provider  naproxen sodium (ANAPROX) 220 MG tablet Take 220 mg by mouth 2 (two) times daily with a meal.   Yes [provider]  ferrous sulfate 325 (65 FE) MG tablet Take 1  tablet (325 mg total) by mouth 2 (two) times daily with a meal. Patient not taking: Reported on 03/30/2017 12/28/16   Molt, Bethany, DO  Olmesartan-Amlodipine-HCTZ (TRIBENZOR) 40-10-12.5 MG TABS Take 1 tablet by mouth daily. Patient not taking: Reported on 04/08/2017 04/01/17   Mariel Aloe, MD    Physical Exam: Vitals:   04/08/17 0700 04/08/17 0730 04/08/17 0800 04/08/17 0830  BP: (!) 168/92 (!) 172/84 (!) 156/94 (!) 154/85  Pulse: 90 91    Resp: 20     Temp:      TempSrc:      SpO2: 100% 100%      General: Alert, Awake and Oriented to Time, Place and Person. Appear in moderate distress, affect labile Eyes: PERRL, Conjunctiva normal ENT: Oral Mucosa clear moist. Neck: no JVD, no Abnormal Mass Or lumps Cardiovascular: S1 and S2 Present, no Murmur, Peripheral Pulses Present Respiratory: normal respiratory effort, Bilateral Air entry equal and Decreased, no use of accessory muscle, Clear to Auscultation, no Crackles, no wheezes Abdomen: Bowel Sound present, Soft and no tenderness, no hernia Skin: no redness, no Rash, no induration Extremities: no Pedal edema, no calf tenderness Neurologic: Grossly no focal neuro deficit. Bilaterally Equal motor strength  Labs on Admission:  CBC:  Recent Labs Lab 04/08/17 0549 04/08/17 0618  WBC 10.1  --   NEUTROABS 6.7  --   HGB 8.9* 8.8*  HCT 26.9* 26.0*  MCV 78.7  --   PLT 513*  --    Basic Metabolic Panel:  Recent Labs Lab 04/08/17 0618  NA 139  K 4.0  CL 107  GLUCOSE 96  BUN 26*  CREATININE 2.60*   GFR: Estimated Creatinine Clearance: 14.1 mL/min (A) (by C-G formula based on SCr of 2.6 mg/dL (H)). Liver Function Tests: No results for input(s): AST, ALT, ALKPHOS, BILITOT, PROT, ALBUMIN in the last 168 hours. No results for input(s): LIPASE, AMYLASE in the last 168 hours. No results for input(s): AMMONIA in the last 168 hours. Coagulation Profile: No results for input(s): INR, PROTIME in the last 168 hours. Cardiac  Enzymes: No results for input(s): CKTOTAL, CKMB, CKMBINDEX, TROPONINI in the last 168 hours. BNP (last 3 results) No results for input(s): PROBNP in the last 8760 hours. HbA1C: No results for input(s): HGBA1C in the last 72 hours. CBG: No results for input(s): GLUCAP in the last 168 hours. Lipid Profile: No results for input(s): CHOL, HDL, LDLCALC, TRIG, CHOLHDL, LDLDIRECT in the last 72 hours. Thyroid Function Tests: No results for input(s): TSH, T4TOTAL, FREET4, T3FREE, THYROIDAB in the last 72 hours. Anemia Panel: No results for input(s): VITAMINB12, FOLATE, FERRITIN, TIBC, IRON, RETICCTPCT in the last 72 hours. Urine analysis:    Component Value Date/Time   COLORURINE YELLOW 02/06/2017 0615   APPEARANCEUR HAZY (A) 02/06/2017 0615   LABSPEC 1.021 02/06/2017 0615   PHURINE 5.0 02/06/2017 0615   GLUCOSEU NEGATIVE 02/06/2017 0615  HGBUR SMALL (A) 02/06/2017 0615   BILIRUBINUR NEGATIVE 02/06/2017 0615   BILIRUBINUR small 04/29/2015 1533   KETONESUR NEGATIVE 02/06/2017 0615   PROTEINUR 100 (A) 02/06/2017 0615   UROBILINOGEN 1.0 06/14/2015 2234   NITRITE NEGATIVE 02/06/2017 0615   LEUKOCYTESUR NEGATIVE 02/06/2017 0615    Radiological Exams on Admission: Dg Chest 2 View  Result Date: 04/08/2017 CLINICAL DATA:  Infection EXAM: CHEST  2 VIEW COMPARISON:  Chest radiograph 10/08/2016 FINDINGS: Normal cardiomediastinal contours. No pulmonary edema or focal consolidation. No pleural effusion or pneumothorax. There are bilateral round opacities overlying the mid thorax, which may be nipple shadows. IMPRESSION: 1. No active cardiopulmonary disease. 2. Bilateral round opacities of the mid chest, likely nipple shadows. Repeat radiograph with nipple markers would be confirmatory. Electronically Signed   By: Ulyses Jarred M.D.   On: 04/08/2017 06:48   Dg Tibia/fibula Left  Result Date: 04/08/2017 CLINICAL DATA:  Open wounds at the left leg, with severe pain. Initial encounter. EXAM: LEFT TIBIA  AND FIBULA - 2 VIEW COMPARISON:  Left tibia/fibula radiographs performed 03/30/2017 FINDINGS: There is no evidence of fracture or dislocation. The tibia and fibula appear grossly intact. The ankle mortise is unremarkable in appearance. The knee joint is unremarkable. No knee joint effusion is identified. A large soft tissue wound is noted overlying the expected location of the left Achilles tendon. No definite osseous erosions are seen at this time. IMPRESSION: 1. No evidence of fracture or dislocation. No definite osseous erosions seen. 2. Large soft tissue wound overlying the expected location of the Achilles tendon. Electronically Signed   By: Garald Balding M.D.   On: 04/08/2017 06:49   Dg Tibia/fibula Right  Result Date: 04/08/2017 CLINICAL DATA:  Open wounds at the right lower leg, with severe pain. Initial encounter. EXAM: RIGHT TIBIA AND FIBULA - 2 VIEW COMPARISON:  Right tibia/fibula radiographs performed 03/30/2017 FINDINGS: There is no evidence of fracture or dislocation. The tibia and fibula appear intact. The ankle mortise is incompletely assessed, but appears grossly unremarkable. No knee joint effusion is identified. Prominent soft tissue wounds are noted along the posterior lower leg. No definite osseous erosions are seen. IMPRESSION: 1. No evidence of fracture or dislocation. No definite osseous erosion seen. 2. Prominent soft tissue wounds along the posterior lower leg. Electronically Signed   By: Garald Balding M.D.   On: 04/08/2017 06:50   Assessment/Plan 1. Wound infection Necrotic nonhealing wound left lower extremity Patient has chronic lower extremity wounds with poor healing and necrosis due to vasculitis as well as active cocaine use disorder Recently admitted in the hospital in June END, discharged on oral antibiotics. After discharge patient did not take any antibiotic nor has seen a PCP or wound care and presented to the hospital today for worsening pain and foul-smelling from  the wound. At present I would resume the antibiotic chosen during last admission, IV doxycycline and IV Unasyn as do not feel further escalation of the antibiotic regimen is necessary at present. Patient primary needs operative repair of this chronic nonhealing wound as well as absolute abstinence from cocaine. I have reviewed her chart, plastic surgery has a relative to the patient and perform debridement, orthopedics [name] has performed an amputation of the toe; but due to patient noncompliance with postoperative wound care as well as continues use of cocaine as caused poor wound healing in these circumstances. In January 2018 podiatry performed wound debridement with the patient fails to go to wound care Center outpatient. Multiple attempts  to get the patient to SNF for continuous wound care has not been successful in the past; also refuses to go to SNF on discharge. Patient presents to the ER and got submitted almost on a monthly basis for this condition. Palliative care was consulted in October 2017 for goals of care discussion. At present I believe that most appropriate course of action would be to consult psychiatry to ensure medical capacity followed by consulting palliative care to get a clarity and goals of care's for the patient. If The patient has medical capacity, and wants operative evaluation; postoperative care must happen in SNF given patient's lack of compliance of continuing care in the outpatient setting.  2. Chronic pain management. Patient was lying in the bed comfortably when I walked in and then suddenly started jumping in the bed due to severe pain. She does have chronic vasculitis as well as chronic nonhealing wound and part of her recurrent chronic cocaine abuse stems from pain control as well. In the past the patient has been started on gabapentin which I would resume. Avoid IV narcotics unless indicated. Use when necessary Norco. When necessary Robaxin.  3.  Polysubstance abuse. Active cocaine use. History of depression. Psychiatry consulted for further assistance. Resuming Wellbutrin which was stopped in the past for unknown reason to help with cocaine abuse as well as depression. Social worker consulted for active substance abuse as well. Patient shows willingness of quitting cocaine in the hospital and then comes back the next month with active abuse. Avoid beta blockers, may need clonidine.  4. Accelerated hypertension. Likely from cocaine abuse. Use Norvasc, avoid beta blockers. May require clonidine, currently holding as patient has exhibited poor compliance with all medications in the past.  5. Acute kidney injury. Likely combination of hemodynamic mediated injury as well as she did note from dehydration and active substance abuse. Continue aggressive IV hydration, check CK, check urinalysis. Recheck the labs as well. If the renal function and urine output does not improve with require further workup and treatment.  Nutrition: regular diet DVT Prophylaxis: subcutaneous Heparin  Advance goals of care discussion: full code   Consults: Psychiatry, palliative care  Family Communication: no family was present at bedside, at the time of interview.  Disposition: Admitted as observation, med-surge unit. Likely to be discharged to be determined, in 1-2 days.  Author: Berle Mull, MD [facility] Pager: [phone] 04/08/2017  If 7PM-7AM, please contact night-coverage [email or web address] Password [email or web address]

## 2017-04-08 NOTE — ED Triage Notes (Signed)
Pt brought in by EMS for c/o bilateral foot pain  Pt has wounds to both feet  Dressings in place  Pt states she changes her dressing herself at home  Foul odor noted from dressings

## 2017-04-08 NOTE — Progress Notes (Signed)
Discuss with Dr. Randal Buba.  [name] is a 56 year old female with history of chronic lower extremity wounds who was just recently admitted 6/26-6/28. Patient reportedly did not take antibiotics prescribed at discharge. Achilles tendon seen from wound.  UDS + cocaine with new AKI Cr 2.6(baseline Cr 0.86). 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 Placed on empiric antibiotics of vancomycin and Zosyn.

## 2017-04-08 NOTE — Progress Notes (Signed)
Pharmacy Antibiotic Follow-up Note  [name] is a 56 y.o. year-old female admitted on 04/08/2017.  The patient is currently on day 1 of Unasyn & Doxycycline/MD for LE wounds.  Assessment/Plan: Doxycycline 149m IV q12 Unasyn 1.5gm q12  Temp (24hrs), Avg:97.8 F (36.6 C), Min:97.8 F (36.6 C), Max:97.8 F (36.6 C)   Recent Labs Lab 04/08/17 0549  WBC 10.1    Recent Labs Lab 04/08/17 0618  CREATININE 2.60*   Estimated Creatinine Clearance: 14.1 mL/min (A) (by C-G formula based on SCr of 2.6 mg/dL (H)).    Allergies  Allergen Reactions  . Acetaminophen Swelling and Other (See Comments)    Reaction:  Eyelid swelling  . Other Other (See Comments)    Pt states she is allergic to a blood pressure medication, but does not know the reaction or medication   Antimicrobials this admission: 7/5 Vancomycin & Zosyn x1 in ED 7/5 Doxycycline >>  7/5 Unasyn >>  Levels/dose changes this admission:  Microbiology results: 7/5 BCx: in process 7/5 HIV Ab: pending       UDS ordered:  Thank you for allowing pharmacy to be a part of this patient's care.  GMinda DittoPharmD Pager [phone]/02/2017, 9:00 AM

## 2017-04-08 NOTE — Consult Note (Addendum)
[facility] Face-to-Face Psychiatry Consult   Reason for Consult:  capacity Referring Physician:  Dr. Patel Patient Identification: [name] MRN:  [id number] Principal Diagnosis: Wound infection Diagnosis:   Patient Active Problem List   Diagnosis Date Noted  . Wound infection [T14.8XXA, L08.9] 03/30/2017  . AKI (acute kidney injury) (HCC) [N17.9] 02/07/2017  . Acute kidney injury (HCC) [N17.9] 02/06/2017  . Wound healing, delayed [T14.8XXD]   . Osteomyelitis (HCC) [M86.9]   . Ulcer with gangrene, with necrosis of muscle (HCC) [L98.493, I96]   . Skin ulcer of lower leg with necrosis of muscle, unspecified laterality (HCC) [L97.903] 06/04/2015  . Protein-calorie malnutrition, severe (HCC) [E43] 01/15/2015  . MDD (major depressive disorder), recurrent episode, severe (HCC) [F33.2] 10/16/2014  . Cocaine use disorder, severe, dependence (HCC) [F14.20] 10/10/2014  . 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 Essential hypertension [I10] 02/26/2010    Total Time spent with patient: 45 minutes  Subjective:   [name] is a 55 y.o. female patient admitted with Leg wounds.  HPI:  [name] is a 55 y.o. female, seen, chart reviewed and case discussed with the unit CSW and hospitalists. Patient is awake, alert, oriented to time place person and situation. Patient reported she has been disabled and living in an apartment by herself. Patient also reportedly smoking tobacco regularly and also cocaine daily. Patient reported she has been tired off using cocaine and now she is trying to quit. Patient denies current acute symptoms of depression, anxiety, mania, auditory/visual hallucinations, delusions paranoia. Patient denies current suicidal/homicidal ideation, intention or plans. Patient has intact cognition including orientation, concentration, immediate memory but poor delayed recall and limited fund of knowledge. Patient seems to be more concrete thought process during  this evaluation. Patient is willing to accept assisted living after being discharged from the hospital and reportedly she was admitted to assisted living facility in the past.  Past Psychiatric History: Patient has history of chronic recurrent substance abuse especially cocaine and tobacco and denies acute psychiatric hospitalization.  Risk to Self: Is patient at risk for suicide?: No Risk to Others:   Prior Inpatient Therapy:   Prior Outpatient Therapy:    Past Medical History:  Past Medical History:  Diagnosis Date  . CAP (community acquired pneumonia) 03/2014 X 2  . Cocaine abuse    ongoing with resultant vaculitis.  . Depression   . Headache    "weekly" (07/29/2016)  . Hypertension   . Inflammatory arthritis   . Migraines    "probably 5-6/yr" (07/29/2016)  . Normocytic anemia    BL Hgb 9.8-12. Last anemia panel 04/2010 - showing Fe 19, ferritin 101.  Pt on monthly B12 injections  . Rheumatoid arthritis(714.0)    patient reported  . 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 Followed by Dr. Nitka    Past Surgical History:  Procedure Laterality Date  . AMPUTATION Left 05/22/2016   Procedure: AMPUTATION LEFT LONG FINGER;  Surgeon: Mark C Yates, MD;  Location: [facility] OR;  Service: Orthopedics;  Laterality: Left;  . HERNIA REPAIR     "stomach"  . I&D EXTREMITY Right 09/26/2015   Procedure: IRRIGATION AND DEBRIDEMENT LEG WOUND  VAC PLACEMENT.;  Surgeon: Claire S Dillingham, DO;  Location: [facility] OR;  Service: Plastics;  Laterality: Right;  . INCISION AND DRAINAGE OF WOUND Bilateral 10/20/2016   Procedure: IRRIGATION AND DEBRIDEMENT WOUND BILATERAL;  Surgeon: Brent M Evans, DPM;  Location: [facility] OR;    Service: Podiatry;  Laterality: Bilateral;  . IRRIGATION AND DEBRIDEMENT ABSCESS Bilateral 09/26/2013   Procedure: DEBRIDEMENT ULCERS BILATERAL THIGHS;  Surgeon: James O Wyatt, MD;  Location: [facility] OR;  Service: General;  Laterality: Bilateral;  .  SKIN BIOPSY Bilateral    shin nodules   Family History:  Family History  Problem Relation Age of Onset  . Breast cancer Mother        Breast cancer  . Alcohol abuse Mother   . Colon cancer Maternal Aunt 50  . Alcohol abuse Father    Family Psychiatric  History: Unknown Social History:  History  Alcohol Use No     History  Drug Use  . Types: "Crack" cocaine, Cocaine    Comment: 07/29/2016 "only use the one you smoke now., last used ~ 1-2 wk ago"    Social History   Social History  . Marital status: Single    Spouse name: N/A  . Number of children: 0  . Years of education: 11th grade   Occupational History  . Disability     since 2011, due to her rheumatoid arthritis   Social History Main Topics  . Smoking status: Current Every Day Smoker    Packs/day: 0.12    Years: 38.00    Types: Cigarettes  . Smokeless tobacco: Never Used     Comment: 2 A DAY  . Alcohol use No  . Drug use: Yes    Types: "Crack" cocaine, Cocaine     Comment: 07/29/2016 "only use the one you smoke now., last used ~ 1-2 wk ago"  . Sexual activity: Not Currently   Other Topics Concern  . None   Social History Narrative   Unemployed:  cleaning in past   Living at [facility]; has Medicaid.   crack/cocaine use; pt denies IVDU   tobacco:  1/2 ppd, trying to quit   alcohol:  none       Additional Social History:    Allergies:   Allergies  Allergen Reactions  . Acetaminophen Swelling and Other (See Comments)    Reaction:  Eyelid swelling  . Other Other (See Comments)    Pt states she is allergic to a blood pressure medication, but does not know the reaction or medication    Labs:  Results for orders placed or performed during the hospital encounter of 04/08/17 (from the past 48 hour(s))  CBC with Differential/Platelet     Status: Abnormal   Collection Time: 04/08/17  5:49 AM  Result Value Ref Range   WBC 10.1 4.0 - 10.5 K/uL   RBC 3.42 (L) 3.87 - 5.11 MIL/uL   Hemoglobin 8.9  (L) 12.0 - 15.0 g/dL   HCT 26.9 (L) 36.0 - 46.0 %   MCV 78.7 78.0 - 100.0 fL   MCH 26.0 26.0 - 34.0 pg   MCHC 33.1 30.0 - 36.0 g/dL   RDW 16.5 (H) 11.5 - 15.5 %   Platelets 513 (H) 150 - 400 K/uL   Neutrophils Relative % 66 %   Neutro Abs 6.7 1.7 - 7.7 K/uL   Lymphocytes Relative 24 %   Lymphs Abs 2.4 0.7 - 4.0 K/uL   Monocytes Relative 7 %   Monocytes Absolute 0.7 0.1 - 1.0 K/uL   Eosinophils Relative 3 %   Eosinophils Absolute 0.3 0.0 - 0.7 K/uL   Basophils Relative 0 %   Basophils Absolute 0.0 0.0 - 0.1 K/uL  I-Stat Chem 8, ED     Status: Abnormal   Collection   Time: 04/08/17  6:18 AM  Result Value Ref Range   Sodium 139 135 - 145 mmol/L   Potassium 4.0 3.5 - 5.1 mmol/L   Chloride 107 101 - 111 mmol/L   BUN 26 (H) 6 - 20 mg/dL   Creatinine, Ser 2.60 (H) 0.44 - 1.00 mg/dL   Glucose, Bld 96 65 - 99 mg/dL   Calcium, Ion 1.17 1.15 - 1.40 mmol/L   TCO2 23 0 - 100 mmol/L   Hemoglobin 8.8 (L) 12.0 - 15.0 g/dL   HCT 26.0 (L) 36.0 - 46.0 %  I-Stat CG4 Lactic Acid, ED     Status: None   Collection Time: 04/08/17  6:18 AM  Result Value Ref Range   Lactic Acid, Venous 0.52 0.5 - 1.9 mmol/L  Basic metabolic panel     Status: Abnormal   Collection Time: 04/08/17  9:54 AM  Result Value Ref Range   Sodium 140 135 - 145 mmol/L   Potassium 4.0 3.5 - 5.1 mmol/L   Chloride 107 101 - 111 mmol/L   CO2 22 22 - 32 mmol/L   Glucose, Bld 103 (H) 65 - 99 mg/dL   BUN 26 (H) 6 - 20 mg/dL   Creatinine, Ser 2.25 (H) 0.44 - 1.00 mg/dL   Calcium 8.7 (L) 8.9 - 10.3 mg/dL   GFR calc non Af Amer 24 (L) >60 mL/min   GFR calc Af Amer 27 (L) >60 mL/min    Comment: (NOTE) The eGFR has been calculated using the CKD EPI equation. This calculation has not been validated in all clinical situations. eGFR's persistently <60 mL/min signify possible Chronic Kidney Disease.    Anion gap 11 5 - 15  Sedimentation rate     Status: Abnormal   Collection Time: 04/08/17  9:54 AM  Result Value Ref Range   Sed  Rate 104 (H) 0 - 22 mm/hr  C-reactive protein     Status: Abnormal   Collection Time: 04/08/17  9:54 AM  Result Value Ref Range   CRP 2.0 (H) <1.0 mg/dL    Comment: Performed at [facility], [address] [place], [place] [address]  Prealbumin     Status: Abnormal   Collection Time: 04/08/17  9:54 AM  Result Value Ref Range   Prealbumin 13.6 (L) 18 - 38 mg/dL    Comment: Performed at [facility], [address] [place],  [address]  Rapid urine drug screen (hospital performed)     Status: Abnormal   Collection Time: 04/08/17  9:56 AM  Result Value Ref Range   Opiates NONE DETECTED NONE DETECTED   Cocaine POSITIVE (A) NONE DETECTED   Benzodiazepines NONE DETECTED NONE DETECTED   Amphetamines POSITIVE (A) NONE DETECTED   Tetrahydrocannabinol NONE DETECTED NONE DETECTED   Barbiturates NONE DETECTED NONE DETECTED    Comment:        DRUG SCREEN FOR MEDICAL PURPOSES ONLY.  IF CONFIRMATION IS NEEDED FOR ANY PURPOSE, NOTIFY LAB WITHIN 5 DAYS.        LOWEST DETECTABLE LIMITS FOR URINE DRUG SCREEN Drug Class       Cutoff (ng/mL) Amphetamine      1000 Barbiturate      200 Benzodiazepine   200 Tricyclics       300 Opiates          300 Cocaine          300 THC              50     CK     Status: None   Collection Time: 04/08/17 10:06 AM  Result Value Ref Range   Total CK 77 38 - 234 U/L    Current Facility-Administered Medications  Medication Dose Route Frequency Provider Last Rate Last Dose  . 0.9 %  sodium chloride infusion   Intravenous Continuous Lavina Hamman, MD 100 mL/hr at 04/08/17 1008    . acetaminophen (TYLENOL) tablet 650 mg  650 mg Oral Q6H PRN Lavina Hamman, MD       Or  . acetaminophen (TYLENOL) suppository 650 mg  650 mg Rectal Q6H PRN Lavina Hamman, MD      . amLODipine (NORVASC) tablet 5 mg  5 mg Oral Daily Lavina Hamman, MD   5 mg at 04/08/17 1030  . ampicillin-sulbactam (UNASYN) 1.5 g in sodium chloride 0.9 % 50 mL IVPB  1.5 g  Intravenous Q12H Green, Terri L, RPH 100 mL/hr at 04/08/17 1309 1.5 g at 04/08/17 1309  . buPROPion (WELLBUTRIN XL) 24 hr tablet 150 mg  150 mg Oral Daily Lavina Hamman, MD   150 mg at 04/08/17 1030  . [START ON 04/09/2017] doxycycline (VIBRAMYCIN) 100 mg in dextrose 5 % 250 mL IVPB  100 mg Intravenous Q12H Lavina Hamman, MD      . feeding supplement (ENSURE ENLIVE) (ENSURE ENLIVE) liquid 237 mL  237 mL Oral BID BM Lavina Hamman, MD   237 mL at 04/08/17 1308  . ferrous sulfate tablet 325 mg  325 mg Oral BID WC Lavina Hamman, MD   325 mg at 04/08/17 1308  . gabapentin (NEURONTIN) capsule 100 mg  100 mg Oral TID Lavina Hamman, MD   100 mg at 04/08/17 1030  . heparin injection 5,000 Units  5,000 Units Subcutaneous Q8H Lavina Hamman, MD      . hydrALAZINE (APRESOLINE) injection 10 mg  10 mg Intravenous Q4H PRN Lavina Hamman, MD      . HYDROcodone-acetaminophen (NORCO/VICODIN) 5-325 MG per tablet 1-2 tablet  1-2 tablet Oral Q4H PRN Lavina Hamman, MD   2 tablet at 04/08/17 1030  . hydrOXYzine (ATARAX/VISTARIL) tablet 25 mg  25 mg Oral TID PRN Lavina Hamman, MD      . methocarbamol (ROBAXIN) tablet 500 mg  500 mg Oral Q8H PRN Lavina Hamman, MD   500 mg at 04/08/17 1030  . ondansetron (ZOFRAN) tablet 4 mg  4 mg Oral Q6H PRN Lavina Hamman, MD       Or  . ondansetron [facility]) injection 4 mg  4 mg Intravenous Q6H PRN Lavina Hamman, MD      . polyethylene glycol (MIRALAX / GLYCOLAX) packet 17 g  17 g Oral Daily PRN Lavina Hamman, MD      . silver sulfADIAZINE (SILVADENE) 1 % cream   Topical Daily Lavina Hamman, MD        Musculoskeletal: Strength & Muscle Tone: decreased Gait & Station: unable to stand Patient leans: N/A  Psychiatric Specialty Exam: Physical Exam as per history and physical   ROS generalized weakness, leg pains and difficulty to walk and no one to care for her. Patient denies nausea, vomiting, abdomen pain, shortness of breath and chest pain. No  Fever-chills, No Headache, No changes with Vision or hearing, reports vertigo No problems swallowing food or Liquids, No Chest pain, Cough or Shortness of Breath, No Abdominal pain, No Nausea or Vommitting, Bowel movements are regular, No Blood in  stool or Urine, No dysuria, No new skin rashes or bruises, No new joints pains-aches,  No new weakness, tingling, numbness in any extremity, No recent weight gain or loss, No polyuria, polydypsia or polyphagia,  A full 10 point Review of Systems was done, except as stated above, all other Review of Systems were negative.   Blood pressure (!) 154/85, pulse 91, temperature 97.8 F (36.6 C), temperature source Oral, resp. rate 20, SpO2 100 %.There is no height or weight on file to calculate BMI.  General Appearance: Disheveled and Guarded  Eye Contact:  Good  Speech:  Clear and Coherent  Volume:  Decreased  Mood:  Anxious and Depressed  Affect:  Constricted and Depressed  Thought Process:  Coherent and Goal Directed  Orientation:  Full (Time, Place, and Person)  Thought Content:  WDL  Suicidal Thoughts:  No  Homicidal Thoughts:  No  Memory:  Immediate;   Good Recent;   Fair Remote;   Poor  Judgement:  Poor  Insight:  Fair  Psychomotor Activity:  Decreased  Concentration:  Concentration: Fair and Attention Span: Fair  Recall:  Good  Fund of Knowledge:  Fair  Language:  Good  Akathisia:  Negative  Handed:  Right  AIMS (if indicated):     Assets:  Communication Skills Desire for Improvement Financial Resources/Insurance Housing Leisure Time Resilience Social Support  ADL's:  Impaired  Cognition:  WNL  Sleep:        Treatment Plan Summary: 56 years old female with the history of tobacco smoking and chronic recurrent cocaine use disorder, severe dependence presented with cocaine-induced vascular disorder.  Patient has no safety concerns We'll start Remeron 7.5 mg at bedtime Continue Wellbutrin and Neurontin as  prescribed Case discussed with the CSW  Based on my evaluation today patient meets criteria for capacity to make her own medical decisions and living arrangements.  Patient benefit from assisted living facility placement after medically stable and discharged  Daily contact with patient to assess and evaluate symptoms and progress in treatment and Medication management  Disposition: No evidence of imminent risk to self or others at present.   Supportive therapy provided about ongoing stressors.  Ambrose Finland, MD 04/08/2017 2:17 PM

## 2017-04-08 NOTE — Consult Note (Signed)
[facility] Nurse wound consult note Reason for Consult: Patient known to the [facility] Nursing department from previous admissions. Last seen 8 days ago by Letitia Caul, RN-C, WTA-C. She was seen by Plastic Surgeon C. Dillingham 4 months ago. Patient continually rubs her LEs, keeps them drawn up against her abdomen and moans softly during my assessment and for wound measuring.  She is polite and expresses appreciation for care. 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 Right malleolus to posterior LE (contiguous wound) 13cm x 9cm x 0.5cm with black dried tissue and dried exposed tendon. Wound is 60% necrotic and 40% red, smooth, non-granulating. Left medial malleolus:  Three moist, pink and circular wounds in an area measuring 10cm x 5cm, the largest of which is the most distal and measures 2cm x 2cm x 0.1cm. Left posterior LE: 9cm x 10cm x 1.5cm wound with 50% black necrotic dry tissue (firmly adherent) and 50% red, moist, smooth (non-granulating) tissue. Buttocks:  Area of moisture associated skin damage (MASD) on the right buttock measuring 1cm round x 0.1. Pink, moist and non-draining. Patient wears mesh briefs with a maxi-pad in the center that is in need of changing. Wound bed: See above Drainage (amount, consistency, odor) Small amounts of serous to light yellow exudate, significant odor consistent with necrotic tissue present. There are several places where dried blood is noted on her bed sheet, but I cannot tell from which wound this might have come. Periwound: Feet and bilateral LEs are with edema, but no erythema, warmth or induration. Evidence of previous wound healing (scars), apparent.  Dressing procedure/placement/frequency: I will implement daily care with silver sulfadiazine rather than the collagenase (Santyl) used in the past in  an effort to impact bioburden and subsequently, the strong odor.  As patient allows only a few nurses to assist her in wound care (electing to do it herself most of the time), we will provide the silver sulfadiazine and tongue blades for application. Ideally, silver sulfadiazine would be applied twice daily, but patient prefers once daily care.  Also, topping with saline moistened gauze is known to prevent adherence, but she is not sure if she will do this.  She prefers to top any topical product with Telfa or similar non-adherent dressings, ABD pads and secure with Kerlix/paper tape.  The nursing staff member responsible for her care today is familiar with her and is one of the few patient allows to assist her. She is a Wound Treatment Associate (WTA) and cared for her last week during her most recent admission. Patient has a consult for palliative care: consideration for goals of care on the chart today due to frequent admissions, inability to comply with recommended lifestyle changes and failure of topical care to move her closer to goals. I explain the rationale for changing from collagenase to silver sulfadiazine and she nods understanding. I told her I appreciated her input to the POC. [place] nursing team will not follow, but will remain available to this patient, the nursing and medical teams.  Please re-consult if needed. Thanks, Maudie Flakes, MSN, RN, [place], Arther Abbott  Pager# [phone]

## 2017-04-08 NOTE — ED Notes (Signed)
BOTH BLOOD CULTURES HAVE BEEN DRAWN [id number] and [id number].

## 2017-04-08 NOTE — ED Provider Notes (Signed)
Case d/w [facility] Plan who will have am team admit patient   Veatrice Kells, MD 04/08/17 [phone]

## 2017-04-08 NOTE — ED Provider Notes (Addendum)
[facility] DEPT Provider Note   CSN: [id number] Arrival date & time: 04/08/17  0446     History   Chief Complaint Chief Complaint  Patient presents with  . Wound Check  . Foot Pain    HPI ADELITA HONE is a 56 y.o. female.  The history is provided by the patient.  Wound Check  This is a chronic problem. The current episode started more than 1 week ago. The problem occurs constantly. The problem has been gradually worsening. Pertinent negatives include no chest pain, no abdominal pain, no headaches and no shortness of breath. Nothing aggravates the symptoms. Nothing relieves the symptoms. Treatments tried: not taking her antibiotics  The treatment provided no relief.  JOVON STREETMAN is a 56 y.o. female with a past medical history significant for hypertension,cocaine abuse with cocaine induced vasculitis and bilateral lower extremity wounds who presents for worsening bilateral lower extremity pain, enlargement of leg wounds, development of foul smell with drainage from the wounds since prior to her last hospitalization in June.  She has not taken the antibiotics she was given at discharge stating that they made her sick.  Her pain has worsened and is now 10/ 10 in both legs. She says that the foul smell is worsening and there were areas of drainage.   Past Medical History:  Diagnosis Date  . CAP (community acquired pneumonia) 03/2014 X 2  . Cocaine abuse    ongoing with resultant vaculitis.  . Depression   . Headache    "weekly" (07/29/2016)  . Hypertension   . Inflammatory arthritis   . Migraines    "probably 5-6/yr" (07/29/2016)  . Normocytic anemia    BL Hgb 9.8-12. Last anemia panel 04/2010 - showing Fe 19, ferritin 101.  Pt on monthly B12 injections  . Rheumatoid arthritis(714.0)    patient reported  . 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 Followed by Dr. Louanne Skye    Patient Active Problem List   Diagnosis Date  Noted  . Wound infection 03/30/2017  . AKI (acute kidney injury) ([place]) 02/07/2017  . Acute kidney injury ([place]) 02/06/2017  . Wound healing, delayed   . Osteomyelitis ([place])   . Ulcer with gangrene, with necrosis of muscle ([place])   . Skin ulcer of lower leg with necrosis of muscle, unspecified laterality ([place]) 06/04/2015  . Protein-calorie malnutrition, severe ([place]) 01/15/2015  . MDD (major depressive disorder), recurrent episode, severe ([place]) 10/16/2014  . Cocaine use disorder, severe, dependence ([place]) 10/10/2014  . 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 Essential hypertension 02/26/2010    Past Surgical History:  Procedure Laterality Date  . AMPUTATION Left 05/22/2016   Procedure: AMPUTATION LEFT LONG FINGER;  Surgeon: Marybelle Killings, MD;  Location: [facility];  Service: Orthopedics;  Laterality: Left;  . HERNIA REPAIR     "stomach"  . I&D EXTREMITY Right 09/26/2015   Procedure: IRRIGATION AND DEBRIDEMENT LEG WOUND  VAC PLACEMENT.;  Surgeon: Loel Lofty Dillingham, DO;  Location: [facility] [place] [facility];  Service: Plastics;  Laterality: Right;  . INCISION AND DRAINAGE OF WOUND Bilateral 10/20/2016   Procedure: IRRIGATION AND DEBRIDEMENT WOUND BILATERAL;  Surgeon: Edrick Kins, DPM;  Location: [facility];  Service: Podiatry;  Laterality: Bilateral;  . IRRIGATION AND DEBRIDEMENT ABSCESS Bilateral 09/26/2013   Procedure: DEBRIDEMENT ULCERS BILATERAL THIGHS;  Surgeon: Gwenyth Ober, MD;  Location: [facility];  Service: General;  Laterality: Bilateral;  . SKIN BIOPSY Bilateral  shin nodules    OB History    No data available       Home Medications    Prior to Admission medications   Medication Sig Start Date End Date Taking? Authorizing Provider  amoxicillin-clavulanate (AUGMENTIN) 500-125 MG tablet Take 1 tablet (500 mg total) by mouth 3 (three) times daily. 04/01/17 04/10/17  Mariel Aloe, MD  collagenase (SANTYL) ointment Apply topically daily. Patient not taking: Reported on 02/01/2017 10/23/16    Minus Liberty, MD  doxycycline (VIBRA-TABS) 100 MG tablet Take 1 tablet (100 mg total) by mouth every 12 (twelve) hours. 04/01/17 04/10/17  Mariel Aloe, MD  feeding supplement (BOOST / RESOURCE BREEZE) LIQD Take 1 Container by mouth 3 (three) times daily between meals. Patient not taking: Reported on 02/01/2017 10/23/16   Minus Liberty, MD  ferrous sulfate 325 (65 FE) MG tablet Take 1 tablet (325 mg total) by mouth 2 (two) times daily with a meal. Patient not taking: Reported on 03/30/2017 12/28/16   Molt, Bethany, DO  ibuprofen (ADVIL,MOTRIN) 200 MG tablet Take 800 mg by mouth every 6 (six) hours as needed for moderate pain.    [provider]  Multiple Vitamins-Iron (MULTIVITAMINS WITH IRON) TABS tablet Take 1 tablet by mouth daily. Patient not taking: Reported on 02/01/2017 10/24/16   Minus Liberty, MD  mupirocin cream (BACTROBAN) 2 % Apply topically daily. Patient not taking: Reported on 03/30/2017 02/09/17   Shela Leff, MD  Olmesartan-Amlodipine-HCTZ [facility]) 40-10-12.5 MG TABS Take 1 tablet by mouth daily. 04/01/17   Mariel Aloe, MD  senna (SENOKOT) 8.6 MG TABS tablet Take 1 tablet (8.6 mg total) by mouth daily as needed for mild constipation. Patient not taking: Reported on 02/01/2017 10/23/16   Minus Liberty, MD    Family History Family History  Problem Relation Age of Onset  . Breast cancer Mother        Breast cancer  . Alcohol abuse Mother   . Colon cancer Maternal Aunt 32  . Alcohol abuse Father     Social History Social History  Substance Use Topics  . Smoking status: Current Every Day Smoker    Packs/day: 0.12    Years: 38.00    Types: Cigarettes  . Smokeless tobacco: Never Used     Comment: 2 A DAY  . Alcohol use No     Allergies   Acetaminophen and Other   Review of Systems Review of Systems  Respiratory: Negative for shortness of breath.   Cardiovascular: Negative for chest pain.  Gastrointestinal: Negative for  abdominal pain.  Genitourinary: Negative for flank pain.  Musculoskeletal: Positive for arthralgias.  Skin: Positive for color change and wound.  Neurological: Negative for headaches.  All other systems reviewed and are negative.    Physical Exam Updated Vital Signs BP (!) 156/103 (BP Location: Left Arm)   Pulse 93   Temp 97.8 F (36.6 C) (Oral)   Resp 20   SpO2 100%   Physical Exam  Constitutional: She appears well-developed and well-nourished.  HENT:  Head: Normocephalic and atraumatic.  Nose: Nose normal.  Mouth/Throat: No oropharyngeal exudate.  Eyes: Conjunctivae are normal. Pupils are equal, round, and reactive to light.  Neck: Normal range of motion. Neck supple.  Cardiovascular: Normal rate, regular rhythm, normal heart sounds and intact distal pulses.   Pulmonary/Chest: Effort normal and breath sounds normal. No respiratory distress. She has no wheezes. She has no rales.  Abdominal: Soft. Bowel sounds are normal. She exhibits no mass. There is no  tenderness. There is no rebound and no guarding.  Musculoskeletal:       Legs: Neurological: She is alert.  Skin: Skin is warm. Capillary refill takes less than 2 seconds. She is not diaphoretic.  Psychiatric: She has a normal mood and affect.     ED Treatments / Results   Vitals:   04/08/17 0514 04/08/17 0649  BP: (!) 156/103 (!) 160/74  Pulse: 93 83  Resp: 20 18  Temp:      Labs (all labs ordered are listed, but only abnormal results are displayed)  Results for orders placed or performed during the hospital encounter of 04/08/17  CBC with Differential/Platelet  Result Value Ref Range   WBC 10.1 4.0 - 10.5 K/uL   RBC 3.42 (L) 3.87 - 5.11 MIL/uL   Hemoglobin 8.9 (L) 12.0 - 15.0 g/dL   HCT 26.9 (L) 36.0 - 46.0 %   MCV 78.7 78.0 - 100.0 fL   MCH 26.0 26.0 - 34.0 pg   MCHC 33.1 30.0 - 36.0 g/dL   RDW 16.5 (H) 11.5 - 15.5 %   Platelets 513 (H) 150 - 400 K/uL   Neutrophils Relative % 66 %   Neutro Abs 6.7  1.7 - 7.7 K/uL   Lymphocytes Relative 24 %   Lymphs Abs 2.4 0.7 - 4.0 K/uL   Monocytes Relative 7 %   Monocytes Absolute 0.7 0.1 - 1.0 K/uL   Eosinophils Relative 3 %   Eosinophils Absolute 0.3 0.0 - 0.7 K/uL   Basophils Relative 0 %   Basophils Absolute 0.0 0.0 - 0.1 K/uL  I-Stat Chem 8, ED  Result Value Ref Range   Sodium 139 135 - 145 mmol/L   Potassium 4.0 3.5 - 5.1 mmol/L   Chloride 107 101 - 111 mmol/L   BUN 26 (H) 6 - 20 mg/dL   Creatinine, Ser 2.60 (H) 0.44 - 1.00 mg/dL   Glucose, Bld 96 65 - 99 mg/dL   Calcium, Ion 1.17 1.15 - 1.40 mmol/L   TCO2 23 0 - 100 mmol/L   Hemoglobin 8.8 (L) 12.0 - 15.0 g/dL   HCT 26.0 (L) 36.0 - 46.0 %  I-Stat CG4 Lactic Acid, ED  Result Value Ref Range   Lactic Acid, Venous 0.52 0.5 - 1.9 mmol/L   Dg Chest 2 View  Result Date: 04/08/2017 CLINICAL DATA:  Infection EXAM: CHEST  2 VIEW COMPARISON:  Chest radiograph 10/08/2016 FINDINGS: Normal cardiomediastinal contours. No pulmonary edema or focal consolidation. No pleural effusion or pneumothorax. There are bilateral round opacities overlying the mid thorax, which may be nipple shadows. IMPRESSION: 1. No active cardiopulmonary disease. 2. Bilateral round opacities of the mid chest, likely nipple shadows. Repeat radiograph with nipple markers would be confirmatory. Electronically Signed   By: Ulyses Jarred M.D.   On: 04/08/2017 06:48   Dg Tibia/fibula Left  Result Date: 04/08/2017 CLINICAL DATA:  Open wounds at the left leg, with severe pain. Initial encounter. EXAM: LEFT TIBIA AND FIBULA - 2 VIEW COMPARISON:  Left tibia/fibula radiographs performed 03/30/2017 FINDINGS: There is no evidence of fracture or dislocation. The tibia and fibula appear grossly intact. The ankle mortise is unremarkable in appearance. The knee joint is unremarkable. No knee joint effusion is identified. A large soft tissue wound is noted overlying the expected location of the left Achilles tendon. No definite osseous erosions are  seen at this time. IMPRESSION: 1. No evidence of fracture or dislocation. No definite osseous erosions seen. 2. Large soft tissue wound overlying the  expected location of the Achilles tendon. Electronically Signed   By: Garald Balding M.D.   On: 04/08/2017 06:49   Dg Tibia/fibula Left  Result Date: 03/30/2017 CLINICAL DATA:  Pt has hx of ulcer with gangrene in March and had surgery at [facility]. C/o bilateral lower leg and foot pain, EXAM: LEFT TIBIA AND FIBULA - 2 VIEW COMPARISON:  03/22/2017 FINDINGS: No fracture.  No bone lesion. There is no bone resorption to suggest osteomyelitis. Knee and ankle joints are normally spaced and aligned. No arthropathic change. There is a soft tissue ulcer that is most evident posteriorly, involving the distal leg and ankle. The normal Achilles tendon shadows non avid suggesting a disrupted Achilles tendon. There are several bubbles of soft tissue air projecting over the wound. IMPRESSION: 1. No fracture or bone lesion. 2. No evidence of osteomyelitis. Electronically Signed   By: Lajean Manes M.D.   On: 03/30/2017 10:44   Dg Tibia/fibula Left  Result Date: 03/22/2017 CLINICAL DATA:  Bilateral open and infected appearing leg wounds EXAM: LEFT TIBIA AND FIBULA - 2 VIEW COMPARISON:  Left tibia and fibula of Feb 06, 2017 FINDINGS: The bones are subjectively adequately mineralized. There is no lytic or blastic lesion or periosteal reaction. There are deep ulcers noted posteriorly and laterally over the distal leg. The observed portions of the knee and ankle are normal. IMPRESSION: Soft tissue ulceration a similar to that seen previously. No radiographic evidence of acute osteomyelitis. As mentioned on the previous study, MRI would be more sensitive for detection of early changes of osteomyelitis. Electronically Signed   By: David  Martinique M.D.   On: 03/22/2017 07:07   Dg Tibia/fibula Right  Result Date: 04/08/2017 CLINICAL DATA:  Open wounds at the right lower leg, with severe  pain. Initial encounter. EXAM: RIGHT TIBIA AND FIBULA - 2 VIEW COMPARISON:  Right tibia/fibula radiographs performed 03/30/2017 FINDINGS: There is no evidence of fracture or dislocation. The tibia and fibula appear intact. The ankle mortise is incompletely assessed, but appears grossly unremarkable. No knee joint effusion is identified. Prominent soft tissue wounds are noted along the posterior lower leg. No definite osseous erosions are seen. IMPRESSION: 1. No evidence of fracture or dislocation. No definite osseous erosion seen. 2. Prominent soft tissue wounds along the posterior lower leg. Electronically Signed   By: Garald Balding M.D.   On: 04/08/2017 06:50   Dg Tibia/fibula Right  Result Date: 03/30/2017 CLINICAL DATA:  Pt has hx of ulcer with gangrene in March and had surgery at [facility]. C/o bilateral lower leg and foot pain, EXAM: RIGHT TIBIA AND FIBULA - 2 VIEW COMPARISON:  03/22/2017 FINDINGS: No fracture.  No bone lesion. No bone resorption is seen to suggest osteomyelitis. The knee and ankle joints are normally spaced and aligned. There is a soft tissue wound is most apparent posteriorly, involving the distal leg and posterior ankle. This may disrupts the Achilles tendon. IMPRESSION: 1. No fracture, bone lesion or evidence of osteomyelitis. Electronically Signed   By: Lajean Manes M.D.   On: 03/30/2017 10:46   Dg Tibia/fibula Right  Result Date: 03/22/2017 CLINICAL DATA:  Bilateral open leg wounds. EXAM: RIGHT TIBIA AND FIBULA - 2 VIEW COMPARISON:  Right tibia and fibula of Feb 06, 2017 FINDINGS: The bones are subjectively adequately mineralized. Suspected osteolysis involving the proximal aspect of the medial malleolus seen previously is not evident today. There is subtle periosteal reaction along the posterior aspect of the distal tibia. The ankle joint mortise is preserved. The  knee exhibits no acute abnormality. There are deep soft tissue wounds over the posterior 1/3 of the leg. IMPRESSION:  Subtle periosteal reaction along the posterior aspect of the lower third of the diaphysis of the fibula. The medial malleolar changes seen on the previous study are not evident today. Deep wounds over the posterior soft tissues of the leg. Electronically Signed   By: David  Martinique M.D.   On: 03/22/2017 07:10   Dg Foot Complete Left  Result Date: 03/30/2017 CLINICAL DATA:  Pt has hx of ulcer with gangrene in March and had surgery at [facility] C/o bilateral lower leg and foot pain. EXAM: LEFT FOOT - COMPLETE 3+ VIEW COMPARISON:  02/06/2017 and 12/22/2016 and 09/09/2016. FINDINGS: No fracture.  No bone lesion. There are periarticular erosions at the PIP joint of the second toe, new since 09/09/2016. This could reflect the sequelae of a septic arthropathy. It may be due to an inflammatory arthropathy. There is no current associated soft tissue swelling. There are no other areas of bone resorption. There is no convincing osteomyelitis. The other joints are normally spaced and aligned. There is a soft tissue wound over the dorsal aspect of the distal leg and ankle. Several bubbles of soft tissue air are noted. IMPRESSION: 1. No convincing osteomyelitis. 2. Erosive changes of the PIP joint of the second toe are likely chronic, but are new since the study dated 09/09/2016 consistent with either the sequelae of an inflammatory arthropathy or septic arthritis. 3. Soft tissue ulcer over the dorsal aspect of the lower leg and ankle. Electronically Signed   By: Lajean Manes M.D.   On: 03/30/2017 10:43   Dg Foot Complete Right  Result Date: 03/30/2017 CLINICAL DATA:  Pt has hx of ulcer with gangrene in March and had surgery at [facility]. C/o bilateral lower leg and foot pain, EXAM: RIGHT FOOT COMPLETE - 3+ VIEW COMPARISON:  Right foot MRI, 02/06/2017 FINDINGS: There is marked joint space narrowing with subchondral erosions and mild ordering sclerosis at the first metatarsophalangeal joint. The proximal phalanx of the great toe  has subluxed laterally in relation to the first metatarsal head. These findings are similar to the prior MRI. The remaining joints are normally spaced and aligned. No other arthropathic change. No soft tissue swelling is evident. There is a small focus of subcutaneous air adjacent to the medial first metatarsal head. IMPRESSION: 1. Advanced arthropathic changes of the first metatarsophalangeal joint, which may reflect the sequelae of a septic arthropathy or inflammatory arthropathy. 2. Small focus of subcutaneous air adjacent to the medial first metatarsal head. 3. No other abnormalities. Electronically Signed   By: Lajean Manes M.D.   On: 03/30/2017 11:13    Radiology Dg Chest 2 View  Result Date: 04/08/2017 CLINICAL DATA:  Infection EXAM: CHEST  2 VIEW COMPARISON:  Chest radiograph 10/08/2016 FINDINGS: Normal cardiomediastinal contours. No pulmonary edema or focal consolidation. No pleural effusion or pneumothorax. There are bilateral round opacities overlying the mid thorax, which may be nipple shadows. IMPRESSION: 1. No active cardiopulmonary disease. 2. Bilateral round opacities of the mid chest, likely nipple shadows. Repeat radiograph with nipple markers would be confirmatory. Electronically Signed   By: Ulyses Jarred M.D.   On: 04/08/2017 06:48   Dg Tibia/fibula Left  Result Date: 04/08/2017 CLINICAL DATA:  Open wounds at the left leg, with severe pain. Initial encounter. EXAM: LEFT TIBIA AND FIBULA - 2 VIEW COMPARISON:  Left tibia/fibula radiographs performed 03/30/2017 FINDINGS: There is no evidence of fracture or dislocation.  The tibia and fibula appear grossly intact. The ankle mortise is unremarkable in appearance. The knee joint is unremarkable. No knee joint effusion is identified. A large soft tissue wound is noted overlying the expected location of the left Achilles tendon. No definite osseous erosions are seen at this time. IMPRESSION: 1. No evidence of fracture or dislocation. No definite  osseous erosions seen. 2. Large soft tissue wound overlying the expected location of the Achilles tendon. Electronically Signed   By: Garald Balding M.D.   On: 04/08/2017 06:49   Dg Tibia/fibula Right  Result Date: 04/08/2017 CLINICAL DATA:  Open wounds at the right lower leg, with severe pain. Initial encounter. EXAM: RIGHT TIBIA AND FIBULA - 2 VIEW COMPARISON:  Right tibia/fibula radiographs performed 03/30/2017 FINDINGS: There is no evidence of fracture or dislocation. The tibia and fibula appear intact. The ankle mortise is incompletely assessed, but appears grossly unremarkable. No knee joint effusion is identified. Prominent soft tissue wounds are noted along the posterior lower leg. No definite osseous erosions are seen. IMPRESSION: 1. No evidence of fracture or dislocation. No definite osseous erosion seen. 2. Prominent soft tissue wounds along the posterior lower leg. Electronically Signed   By: Garald Balding M.D.   On: 04/08/2017 06:50        Procedures Procedures (including critical care time)  Medications Ordered in ED Medications  vancomycin (VANCOCIN) IVPB 1000 mg/200 mL premix (1,000 mg Intravenous New Bag/Given 04/08/17 0648)  0.9 %  sodium chloride infusion (not administered)  fentaNYL (SUBLIMAZE) injection 50 mcg (50 mcg Intravenous Given 04/08/17 0652)  haloperidol lactate (HALDOL) injection 5 mg (5 mg Intravenous Given 04/08/17 0616)  sodium chloride 0.9 % bolus 1,000 mL (1,000 mLs Intravenous New Bag/Given 04/08/17 0617)  piperacillin-tazobactam (ZOSYN) IVPB 3.375 g (0 g Intravenous Stopped 04/08/17 0648)       Final Clinical Impressions(s) / ED Diagnoses  Wound infection, cocaine induced vasculitis and acute renal failure.  Will admit to medicine for IVF to improve renal function and IV abx.         Ameri Cahoon, MD 04/08/17 [phone]

## 2017-04-08 NOTE — Care Management Note (Addendum)
Case Management Note  Patient Details  Name: [name] MRN: [id number] Date of Birth: 01-Nov-1960  Subjective/Objective:    Nonhealing wounds, cocaine abuse                Action/Plan: Discharge Planning: NCM spoke to pt and states she lives in a boarding house. Explained [facility] RN will not be able to come to an unsafe environment. States she does get a SS check monthly that pays her rent. She panhandles for money for drugs. States she will be will to go to SNF if IV abx is need long term. States she just does not want to end up in a SNF forever. Pt states she [facility] ([facility] Called to make clinic aware. Scheduled appt for 04/21/2017 at 43 am. Pt requesting her brother for HCPOA, Chaplain referral.    Expected Discharge Date:             Expected Discharge Plan:  [place]  In-House Referral:  Clinical Social Work, Clinical biochemist  Discharge planning Services  CM Consult  Post Acute Care Choice:  Home Health Choice offered to:  Patient  DME Arranged:    DME Agency:     HH Arranged:  RN [place] Agency:     Status of Service:  In process, will continue to follow  If discussed at Long Length of Stay Meetings, dates discussed:    Additional Comments:  Erenest Rasher, RN 04/08/2017, 3:36 PM

## 2017-04-09 DIAGNOSIS — T148XXA Other injury of unspecified body region, initial encounter: Secondary | ICD-10-CM | POA: Diagnosis not present

## 2017-04-09 DIAGNOSIS — F1420 Cocaine dependence, uncomplicated: Secondary | ICD-10-CM | POA: Diagnosis present

## 2017-04-09 DIAGNOSIS — Z713 Dietary counseling and surveillance: Secondary | ICD-10-CM | POA: Diagnosis not present

## 2017-04-09 DIAGNOSIS — R269 Unspecified abnormalities of gait and mobility: Secondary | ICD-10-CM | POA: Diagnosis not present

## 2017-04-09 DIAGNOSIS — G546 Phantom limb syndrome with pain: Secondary | ICD-10-CM | POA: Diagnosis present

## 2017-04-09 DIAGNOSIS — Z9119 Patient's noncompliance with other medical treatment and regimen: Secondary | ICD-10-CM | POA: Diagnosis not present

## 2017-04-09 DIAGNOSIS — Z811 Family history of alcohol abuse and dependence: Secondary | ICD-10-CM | POA: Diagnosis not present

## 2017-04-09 DIAGNOSIS — I70262 Atherosclerosis of native arteries of extremities with gangrene, left leg: Secondary | ICD-10-CM | POA: Diagnosis not present

## 2017-04-09 DIAGNOSIS — M79672 Pain in left foot: Secondary | ICD-10-CM | POA: Diagnosis present

## 2017-04-09 DIAGNOSIS — Z681 Body mass index (BMI) 19 or less, adult: Secondary | ICD-10-CM | POA: Diagnosis not present

## 2017-04-09 DIAGNOSIS — Z8701 Personal history of pneumonia (recurrent): Secondary | ICD-10-CM | POA: Diagnosis not present

## 2017-04-09 DIAGNOSIS — Z9114 Patient's other noncompliance with medication regimen: Secondary | ICD-10-CM | POA: Diagnosis not present

## 2017-04-09 DIAGNOSIS — N179 Acute kidney failure, unspecified: Secondary | ICD-10-CM | POA: Diagnosis present

## 2017-04-09 DIAGNOSIS — L089 Local infection of the skin and subcutaneous tissue, unspecified: Secondary | ICD-10-CM | POA: Diagnosis not present

## 2017-04-09 DIAGNOSIS — G8929 Other chronic pain: Secondary | ICD-10-CM | POA: Diagnosis present

## 2017-04-09 DIAGNOSIS — F329 Major depressive disorder, single episode, unspecified: Secondary | ICD-10-CM | POA: Diagnosis present

## 2017-04-09 DIAGNOSIS — Z886 Allergy status to analgesic agent status: Secondary | ICD-10-CM | POA: Diagnosis not present

## 2017-04-09 DIAGNOSIS — F14988 Cocaine use, unspecified with other cocaine-induced disorder: Secondary | ICD-10-CM | POA: Diagnosis not present

## 2017-04-09 DIAGNOSIS — I10 Essential (primary) hypertension: Secondary | ICD-10-CM | POA: Diagnosis present

## 2017-04-09 DIAGNOSIS — L97213 Non-pressure chronic ulcer of right calf with necrosis of muscle: Secondary | ICD-10-CM | POA: Diagnosis present

## 2017-04-09 DIAGNOSIS — K5900 Constipation, unspecified: Secondary | ICD-10-CM | POA: Diagnosis present

## 2017-04-09 DIAGNOSIS — L97903 Non-pressure chronic ulcer of unspecified part of unspecified lower leg with necrosis of muscle: Secondary | ICD-10-CM | POA: Diagnosis not present

## 2017-04-09 DIAGNOSIS — I169 Hypertensive crisis, unspecified: Secondary | ICD-10-CM | POA: Diagnosis not present

## 2017-04-09 DIAGNOSIS — I96 Gangrene, not elsewhere classified: Secondary | ICD-10-CM | POA: Diagnosis present

## 2017-04-09 DIAGNOSIS — I999 Unspecified disorder of circulatory system: Secondary | ICD-10-CM | POA: Diagnosis not present

## 2017-04-09 DIAGNOSIS — F17210 Nicotine dependence, cigarettes, uncomplicated: Secondary | ICD-10-CM | POA: Diagnosis present

## 2017-04-09 DIAGNOSIS — I776 Arteritis, unspecified: Secondary | ICD-10-CM | POA: Diagnosis present

## 2017-04-09 DIAGNOSIS — L97223 Non-pressure chronic ulcer of left calf with necrosis of muscle: Secondary | ICD-10-CM | POA: Diagnosis present

## 2017-04-09 DIAGNOSIS — I70261 Atherosclerosis of native arteries of extremities with gangrene, right leg: Secondary | ICD-10-CM | POA: Diagnosis not present

## 2017-04-09 DIAGNOSIS — Z800 Family history of malignant neoplasm of digestive organs: Secondary | ICD-10-CM | POA: Diagnosis not present

## 2017-04-09 DIAGNOSIS — Z89511 Acquired absence of right leg below knee: Secondary | ICD-10-CM | POA: Diagnosis not present

## 2017-04-09 DIAGNOSIS — Z803 Family history of malignant neoplasm of breast: Secondary | ICD-10-CM | POA: Diagnosis not present

## 2017-04-09 DIAGNOSIS — Z89512 Acquired absence of left leg below knee: Secondary | ICD-10-CM | POA: Diagnosis not present

## 2017-04-09 DIAGNOSIS — E860 Dehydration: Secondary | ICD-10-CM | POA: Diagnosis present

## 2017-04-09 DIAGNOSIS — I70263 Atherosclerosis of native arteries of extremities with gangrene, bilateral legs: Secondary | ICD-10-CM

## 2017-04-09 DIAGNOSIS — D62 Acute posthemorrhagic anemia: Secondary | ICD-10-CM | POA: Diagnosis not present

## 2017-04-09 DIAGNOSIS — E43 Unspecified severe protein-calorie malnutrition: Secondary | ICD-10-CM | POA: Diagnosis present

## 2017-04-09 DIAGNOSIS — G8918 Other acute postprocedural pain: Secondary | ICD-10-CM | POA: Diagnosis not present

## 2017-04-09 LAB — BASIC METABOLIC PANEL
ANION GAP: 8 (ref 5–15)
BUN: 20 mg/dL (ref 6–20)
CO2: 21 mmol/L — AB (ref 22–32)
Calcium: 8.6 mg/dL — ABNORMAL LOW (ref 8.9–10.3)
Chloride: 112 mmol/L — ABNORMAL HIGH (ref 101–111)
Creatinine, Ser: 1.18 mg/dL — ABNORMAL HIGH (ref 0.44–1.00)
GFR calc Af Amer: 60 mL/min — ABNORMAL LOW (ref >60)
GFR, EST NON AFRICAN AMERICAN: 52 mL/min — AB (ref >60)
GLUCOSE: 104 mg/dL — AB (ref 65–99)
Potassium: 4 mmol/L (ref 3.5–5.1)
Sodium: 141 mmol/L (ref 135–145)

## 2017-04-09 LAB — URINALYSIS, ROUTINE W REFLEX MICROSCOPIC
Bilirubin Urine: NEGATIVE
Glucose, UA: 50 mg/dL — AB
Hgb urine dipstick: NEGATIVE
Ketones, ur: NEGATIVE mg/dL
Leukocytes, UA: NEGATIVE
Nitrite: NEGATIVE
Protein, ur: 100 mg/dL — AB
SPECIFIC GRAVITY, URINE: 1.02 (ref 1.005–1.030)
pH: 5 (ref 5.0–8.0)

## 2017-04-09 LAB — CBC
HEMATOCRIT: 28 % — AB (ref 36.0–46.0)
Hemoglobin: 8.9 g/dL — ABNORMAL LOW (ref 12.0–15.0)
MCH: 24.9 pg — ABNORMAL LOW (ref 26.0–34.0)
MCHC: 31.8 g/dL (ref 30.0–36.0)
MCV: 78.4 fL (ref 78.0–100.0)
PLATELETS: 441 10*3/uL — AB (ref 150–400)
RBC: 3.57 MIL/uL — AB (ref 3.87–5.11)
RDW: 16 % — ABNORMAL HIGH (ref 11.5–15.5)
WBC: 7.3 10*3/uL (ref 4.0–10.5)

## 2017-04-09 MED ORDER — CHLORHEXIDINE GLUCONATE 4 % EX LIQD
60.0000 mL | Freq: Once | CUTANEOUS | Status: DC
  Filled 2017-04-09: qty 60

## 2017-04-09 MED ORDER — CHLORHEXIDINE GLUCONATE 4 % EX LIQD
60.0000 mL | Freq: Once | CUTANEOUS | Status: AC
  Administered 2017-04-10: 4 via TOPICAL
  Filled 2017-04-09 (×2): qty 60

## 2017-04-09 MED ORDER — ADULT MULTIVITAMIN W/MINERALS CH
1.0000 | ORAL_TABLET | Freq: Every day | ORAL | Status: DC
  Administered 2017-04-09 – 2017-04-13 (×5): 1 via ORAL
  Filled 2017-04-09 (×5): qty 1

## 2017-04-09 MED ORDER — SODIUM CHLORIDE 0.9 % IV SOLN
1.5000 g | Freq: Three times a day (TID) | INTRAVENOUS | Status: DC
  Administered 2017-04-09 – 2017-04-12 (×9): 1.5 g via INTRAVENOUS
  Filled 2017-04-09 (×12): qty 1.5

## 2017-04-09 MED ORDER — DOXYCYCLINE HYCLATE 100 MG PO TABS
100.0000 mg | ORAL_TABLET | Freq: Two times a day (BID) | ORAL | Status: DC
  Administered 2017-04-09 – 2017-04-12 (×7): 100 mg via ORAL
  Filled 2017-04-09 (×7): qty 1

## 2017-04-09 MED ORDER — SODIUM CHLORIDE 0.9 % IV SOLN
INTRAVENOUS | Status: AC
  Administered 2017-04-09: 10:00:00 via INTRAVENOUS

## 2017-04-09 MED ORDER — CEFAZOLIN SODIUM-DEXTROSE 2-4 GM/100ML-% IV SOLN
2.0000 g | INTRAVENOUS | Status: AC
  Administered 2017-04-10: 2 g via INTRAVENOUS
  Filled 2017-04-09: qty 100

## 2017-04-09 MED ORDER — POVIDONE-IODINE 10 % EX SWAB
2.0000 "application " | Freq: Once | CUTANEOUS | Status: AC
  Administered 2017-04-10: 2 via TOPICAL

## 2017-04-09 MED ORDER — BOOST / RESOURCE BREEZE PO LIQD
1.0000 | Freq: Three times a day (TID) | ORAL | Status: DC
  Administered 2017-04-09 – 2017-04-13 (×13): 1 via ORAL

## 2017-04-09 NOTE — Progress Notes (Signed)
Palliative Care Progress Notes  Chart reviewed and case discussed with Dr. Venetia Constable.  Plan for transfer to [facility] for evaluation by ortho for possible amputation.  Discussed engaging patient now vs await surgical eval to see if there is possibility of surgical intervention.    After discussion with attending, will plan to hold on consult at this time until she can be evaluated by ortho team.  It may be most beneficial to have a better idea which options are possible (such as surgical intervention) prior to discussion of long term goals.    Will continue to follow with plan to engage after surgical evaluation.  Please let us know if we can be of further assistance in the interim.  NO CHARGE NOTE  Micheline Rough, MD [facility] [phone]

## 2017-04-09 NOTE — Progress Notes (Signed)
Attempted to contact patient's brother in regards to amputation scheduled for tomorrow. Left a message with a call back number. Updated MD, " 5w34. Leu, haven't been able to get in contact with patients brother. Left a message. will update when on board. "

## 2017-04-09 NOTE — Progress Notes (Addendum)
Pharmacy Antibiotic Follow-up Note  [name] is a 56 y.o. year-old female admitted on 04/08/2017.  The patient is currently on day 2 of Unasyn and day 1 of  Doxycycline for LE wounds.  WBC WNL, AF, creat 2.6>2.25>1.18. Taking PO meds.   Assessment/Plan: Doxycycline 100 mg PO q12 (change to PO) Unasyn 1.5gm q8 Consider pre-med with zofran or phenergan to allow pt to tolerate PO abx if N/V is an issue Pharmacy to sign off  Temp (24hrs), Avg:98.8 F (37.1 C), Min:98.5 F (36.9 C), Max:99.3 F (37.4 C)   Recent Labs Lab 04/08/17 0549 04/09/17 0510  WBC 10.1 7.3     Recent Labs Lab 04/08/17 0618 04/08/17 0954 04/09/17 0510  CREATININE 2.60* 2.25* 1.18*   Estimated Creatinine Clearance: 35.2 mL/min (A) (by C-G formula based on SCr of 1.18 mg/dL (H)).    Allergies  Allergen Reactions  . Acetaminophen Swelling and Other (See Comments)    Reaction:  Eyelid swelling  . Other Other (See Comments)    Pt states she is allergic to a blood pressure medication, but does not know the reaction or medication   Antimicrobials this admission: 7/5 Vancomycin x 1 7/5 zosyn x 1 7/6 Doxycycline >>  7/5 Unasyn >>  Levels/dose changes this admission: 7/6 increase Unasyn to 1.5 q8 7/6 change doxy to PO Microbiology results: 7/5 BCx: in process 7/5 HIV Ab: pending       UDS ordered:  Thank you for allowing pharmacy to be a part of this patient's care.  Eudelia Bunch, Pharm.D. [phone] 04/09/2017 8:02 AM

## 2017-04-09 NOTE — Clinical Social Work Note (Addendum)
Clinical Social Work Assessment  Patient Details  Name: [name] MRN: [id number] Date of Birth: 1961-04-12  Date of referral:  04/09/17               Reason for consult:  Mental Health Concerns                Permission sought to share information with:    Permission granted to share information::     Name::        Agency::     Relationship::     Contact Information:     Housing/Transportation Living arrangements for the past 2 months:  [place] of Information:  Patient Patient Interpreter Needed:  None Criminal Activity/Legal Involvement Pertinent to Current Situation/Hospitalization:  No - Comment as needed Significant Relationships:  Siblings Lives with:  Self Do you feel safe going back to the place where you live?  Yes Need for family participation in patient care:  No (Coment)  Care giving concerns: Foul smelling wound with worsening pain in leg. Cocaine use/ Depression  Facilities manager / plan:  CSW and psychiatrist met with patient at bedside, explain role and reason for visit. Patient agreeable to talk. Patient reports she has been feeling sadness due to the condition of her wounds.Patient denies SI/HI, visual and auditory hallucinations. The patient reports they are painful and hard to manage. Patient reports she lives in a boarding house and received SSI check. Patient a brother but no other family. She reports has not saw them in 20 years. Patient became tearful when she discussed her mother.  Patient reports cocaine, tobacco use and denies alcohol use. The patient reports she has been trying to quit using, but did not indicate any rehab programs used in the past.  At it this time the patient is unsure if she will need IV antibiotics. The patient reports, " I will think about going to nursing home if IV is needed."  Patient open to learning about SA treatment facilities.   Plan: Still being assessed.   Employment status:  Unemployed Radiation protection practitioner:  Medicaid In [place] PT Recommendations:  Not assessed at this time Information / Referral to community resources:  Outpatient Substance Abuse Treatment Options, Outpatient Psychiatric Care (Comment Required)  Patient/Family's Response to care:  No family at bedside, agreeable and responding to care.   Patient/Family's Understanding of and Emotional Response to Diagnosis, Current Treatment, and Prognosis:  No family at bedside. Patient tearful and is learning the  medical work up needed for wounds.   Emotional Assessment Appearance:    Attitude/Demeanor/Rapport:    Affect (typically observed):  Accepting, Tearful/Crying Orientation:  Oriented to Self, Oriented to Place, Oriented to  Time, Oriented to Situation Alcohol / Substance use:  Illicit Drugs Psych involvement (Current and /or in the community):  Yes (Comment)  Discharge Needs  Concerns to be addressed:  Substance Abuse Concerns, Mental Health Concerns Readmission within the last 30 days:    Current discharge risk:  Substance Abuse Barriers to Discharge:  Continued Medical Work up   [employer], LCSW 04/09/2017, 8:34 AM

## 2017-04-09 NOTE — Progress Notes (Signed)
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  GOAL:   Patient will meet greater than or equal to 90% of their needs  MONITOR:   PO intake, Supplement acceptance, Labs, Weight trends, Skin, I & O's  REASON FOR ASSESSMENT:   Consult, Malnutrition Screening Tool Wound healing  ASSESSMENT:   [name] is a 56 y.o. female with Past medical history of cocaine abuse, depression, hypertension, cocaine induced vasculitis with nonhealing wounds.  Pt admitted with necrotic healing wound of left lower extremity.   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   Pt is familiar to this RD, due to multiple prior admissions. Pt was consuming breakfast at time of visit. She shares she has a good appetite (meal completion 95%). PTA, she reports consuming approximately two meals per day (meals include items such as chicken, rice, and gravy).   Pt suspects she has lost weight. Per wt hx, pt has experienced a 9.2% wt loss over the past 6 months. While not significant, wt loss in concerning given malnutrition, underweight status, and increased nutrient needs in the setting of wound healing.   Nutrition-Focused physical exam completed. Findings are moderate to severe fat depletion, moderate to severe muscle depletion, and no edema.   Discussed with  pt importance of good nutritional intake (meals and supplements, especially protein) to promote healing. Pt reports that she has been drinking the Ensure supplements, however, is requesting Boost Breeze, due to preference.   Palliative care consult pending for goals of care. Per CSW note, pt is willing to go to SNF if IV antibiotics are needed.   Labs reviewed.   Diet Order:  Diet regular Room service appropriate? Yes; Fluid consistency: Thin  Skin:  Wound (see comment) (MASD buttocks, multiple areas of levamisole vasculitis)  Last BM:  04/07/17  Height:   Ht Readings from Last 1 Encounters:  03/30/17 5' 1.5" (1.562 m)    Weight:   Wt Readings from Last 1 Encounters:  04/09/17 89 lb 1.1 oz (40.4 kg)    Ideal Body Weight:  48.9 kg  BMI:  Body mass index is 16.56 kg/m.  Estimated Nutritional Needs:   Kcal:  1400-1600  Protein:  65-80 grams  Fluid:  > 1.4 L  EDUCATION NEEDS:   Education needs addressed  Rosana Farnell A. Jimmye Norman, RD, LDN, CDE Pager: [phone] After hours Pager: [phone]

## 2017-04-09 NOTE — Progress Notes (Signed)
Report called to [place] on [place] at [facility].  Patient transported by [facility].

## 2017-04-09 NOTE — Progress Notes (Addendum)
[facility] PROGRESS NOTE    Progress Note  [name]  GGY:[id number] DOB: 1961-03-16 DOA: 04/08/2017 PCP: Patient, No Pcp Per     Brief Narrative:   [name] is an 56 y.o. 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  Assessment/Plan:   Skin ulcer of lower leg with necrosis of muscle, unspecified laterality ([place]) see pictures from Dr. Nicholes Stairs: The patient has known chronic lower extremity wounds which are poor healing and necrosis due to vasculitis and ongoing cocaine use. Recently admitted in the hospital in June and discharge her on oral antibiotics patient was noncompliant with her medication nor with her appointments with her PCP. I agree with antibiotics. As her platelet count was 513 on admission likely acting as an acute phase reactant. I have reviewed her chart in plastic surgery did perform a debridement on this patient, [name] orthopedic surgeon perform amputation, in June 2018 the podiatrist perform debridement, but the patient failed to follow-up with wound care center as an outpatient.  Multiple attempts to pass his been unsuccessful for the patient to get wound care at skilled nursing facility. She has been evaluated by psych which deemed her to have the capacity to make medical decisions. I have contacted [facility], and they requested transfer to call for further evaluation and possibly amputation.  Chronic pain management: We'll continue to avoid IV narcotics unless indicated, agree with oral Norco and gabapentin.  Probably substance abuse/Cocaine use disorder, severe, dependence ([place])  With active cocaine use, psychiatry was consulted and recommended to start Wellbutrin. Social and consulted for substance abuse. Patient shows willingness to quit cocaine will continue monitor for signs  of withdrawal.  Elevated hypertension: Likely due to cocaine, agree with Norvasc will use clonidine when necessary.  Acute kidney injury: Acute prerenal in etiology, improving with IV fluid hydration continue normal saline.   DVT prophylaxis: lovenox Family Communication:none Disposition Plan/Barrier to D/C: unable to detemrine Code Status:     Code Status Orders        Start     Ordered   04/08/17 0834  Full code  Continuous     04/08/17 0834    Code Status History    Date Active Date Inactive Code Status Order ID Comments User Context   03/30/2017  6:51 PM 04/01/2017  8:24 PM Full Code [id number]  Verlee Monte, MD Inpatient   02/06/2017  6:05 AM 02/08/2017  8:31 PM Full Code [id number]  Riccardo Dubin, MD ED   12/22/2016  9:31 PM 12/28/2016 10:34 PM Full Code [id number]  Burgess Estelle, MD Inpatient   10/14/2016  3:25 PM 10/24/2016 10:53 PM Full Code [id number]  Jule Ser, DO Inpatient   09/21/2016  1:07 AM 09/22/2016  7:10 PM Full Code [id number]  Junius Creamer, NP ED   09/09/2016  5:22 PM 09/11/2016  5:01 PM Full Code [id number]  Juliet Rude, MD Inpatient   07/25/2016  5:25 AM 07/29/2016  9:20 PM Full Code [id number]  Juliet Rude, MD Inpatient   06/14/2016  3:13 PM 06/19/2016  7:31 PM Full Code [id number]  Florinda Marker, MD Inpatient   05/18/2016  7:59 PM 05/23/2016  5:17 PM Full Code [id number]  Juliet Rude, MD Inpatient   04/27/2016  9:11 AM 05/01/2016  7:16 PM Full Code [id number]  Florinda Marker, MD Inpatient   04/01/2016  5:27  PM 04/04/2016  4:51 PM Full Code [id number]  Jones Bales, MD Inpatient   11/14/2015 11:16 PM 11/15/2015  5:29 PM Full Code [id number]  Juluis Mire, MD ED   10/23/2015  4:15 PM 10/29/2015  4:36 PM Full Code [id number]  Juliet Rude, MD Inpatient   09/25/2015  5:36 AM 10/02/2015  7:11 PM Full Code [id number]  Riccardo Dubin, MD ED   06/15/2015  1:47 AM 06/17/2015  8:39 PM Full Code [id number]  Vickii Chafe, MD Inpatient   06/04/2015  8:45 PM 06/05/2015   9:33 PM Full Code [id number]  Corky Sox, MD Inpatient   04/21/2015  2:05 PM 04/22/2015 10:25 PM Full Code [id number]  Corky Sox, MD Inpatient   04/08/2015  1:01 AM 04/08/2015  8:15 PM Full Code [id number]  Dellia Nims, MD Inpatient   03/09/2015  9:35 PM 03/12/2015 12:31 AM Full Code [id number]  Karlene Einstein, MD Inpatient   01/14/2015  5:34 PM 01/17/2015  1:10 AM Full Code [id number]  Juliet Rude, MD Inpatient   10/09/2014 10:45 PM 10/17/2014  5:38 PM Full Code [id number]  Laverle Hobby, PA-C Inpatient   10/09/2014  3:00 AM 10/09/2014 10:45 PM Full Code [id number]  Everlene Balls, MD ED   05/07/2014  5:09 PM 05/08/2014  7:23 PM Full Code [id number]  Benjamine Mola, [place] Inpatient   05/07/2014  8:35 AM 05/07/2014  5:09 PM Full Code [id number]  Ernestina Patches, MD ED   05/07/2014  1:08 AM 05/07/2014  8:35 AM Full Code [id number]  Carmin Muskrat, MD ED   04/19/2014  5:26 PM 04/22/2014  2:22 PM Full Code [id number]  Clinton Gallant, MD Inpatient   03/30/2014  3:27 AM 03/31/2014  8:08 PM Full Code [id number]  Cresenciano Genre, MD Inpatient   01/13/2014  6:23 PM 01/26/2014  4:51 PM Full Code [id number]  Waylan Boga, NP Inpatient   01/13/2014  4:57 AM 01/13/2014  6:23 PM Full Code [id number]  Garald Balding, NP ED   10/11/2013  4:50 AM 10/17/2013  8:05 PM Full Code [id number]  Blain Pais, MD ED   09/24/2013  6:52 PM 10/02/2013  6:07 PM Full Code [id number]  Blain Pais, MD Inpatient   08/30/2013  4:06 AM 08/30/2013 11:40 PM Full Code [id number]  Martie Lee, PA-C ED   06/23/2013  9:05 PM 06/28/2013  5:56 PM Full Code [id number]  Delfin Gant, NP Inpatient   06/22/2013  4:01 PM 06/23/2013  9:05 PM Full Code [id number]  Mariea Clonts, MD ED   06/13/2013 11:40 PM 06/14/2013  8:45 PM Full Code [id number]  Othella Boyer, MD Inpatient   05/13/2013 11:23 AM 05/14/2013  9:24 PM Full Code [id number]  Waynetta Pean ED   02/13/2012  4:03 PM 02/15/2012  6:29 PM Full Code [id number]  Shary Decamp, RN Inpatient   10/14/2011  1:06  PM 10/14/2011  1:06 PM DNR [id number]  Thera Flake, MD Inpatient        IV Access:    Peripheral IV   Procedures and diagnostic studies:   Dg Chest 2 View  Result Date: 04/08/2017 CLINICAL DATA:  Infection EXAM: CHEST  2 VIEW COMPARISON:  Chest radiograph 10/08/2016 FINDINGS: Normal cardiomediastinal contours. No pulmonary edema or focal consolidation. No pleural effusion or pneumothorax. There are bilateral round opacities overlying the mid thorax, which may be nipple shadows. IMPRESSION: 1. No active cardiopulmonary disease. 2. Bilateral round opacities of the mid chest, likely nipple  shadows. Repeat radiograph with nipple markers would be confirmatory. Electronically Signed   By: Ulyses Jarred M.D.   On: 04/08/2017 06:48   Dg Tibia/fibula Left  Result Date: 04/08/2017 CLINICAL DATA:  Open wounds at the left leg, with severe pain. Initial encounter. EXAM: LEFT TIBIA AND FIBULA - 2 VIEW COMPARISON:  Left tibia/fibula radiographs performed 03/30/2017 FINDINGS: There is no evidence of fracture or dislocation. The tibia and fibula appear grossly intact. The ankle mortise is unremarkable in appearance. The knee joint is unremarkable. No knee joint effusion is identified. A large soft tissue wound is noted overlying the expected location of the left Achilles tendon. No definite osseous erosions are seen at this time. IMPRESSION: 1. No evidence of fracture or dislocation. No definite osseous erosions seen. 2. Large soft tissue wound overlying the expected location of the Achilles tendon. Electronically Signed   By: Garald Balding M.D.   On: 04/08/2017 06:49   Dg Tibia/fibula Right  Result Date: 04/08/2017 CLINICAL DATA:  Open wounds at the right lower leg, with severe pain. Initial encounter. EXAM: RIGHT TIBIA AND FIBULA - 2 VIEW COMPARISON:  Right tibia/fibula radiographs performed 03/30/2017 FINDINGS: There is no evidence of fracture or dislocation. The tibia and fibula appear intact. The ankle  mortise is incompletely assessed, but appears grossly unremarkable. No knee joint effusion is identified. Prominent soft tissue wounds are noted along the posterior lower leg. No definite osseous erosions are seen. IMPRESSION: 1. No evidence of fracture or dislocation. No definite osseous erosion seen. 2. Prominent soft tissue wounds along the posterior lower leg. Electronically Signed   By: Garald Balding M.D.   On: 04/08/2017 06:50     Medical Consultants:    None.  Anti-Infectives:   Doxycycline and Unasyn.  Subjective:    [name] she relates her pain is controlled.  Objective:    Vitals:   04/08/17 1400 04/08/17 2016 04/08/17 2155 04/09/17 0641  BP: (!) 199/99 (!) 180/96 (!) 186/90 (!) 196/91  Pulse: 96 98  100  Resp: 18 18  18   Temp: 98.7 F (37.1 C) 98.5 F (36.9 C)  99.3 F (37.4 C)  TempSrc: Oral Oral  Oral  SpO2: 100% 100%  100%  Weight:    40.4 kg (89 lb 1.1 oz)    Intake/Output Summary (Last 24 hours) at 04/09/17 0846 Last data filed at 04/09/17 0644  Gross per 24 hour  Intake          2111.67 ml  Output             1750 ml  Net           361.67 ml   Filed Weights   04/09/17 0641  Weight: 40.4 kg (89 lb 1.1 oz)    Exam: General exam: In no acute distress Respiratory system: Good air movement clear to auscultation. Cardiovascular system: Regular rate and rhythm with positive S1-S2 no murmurs or gallops. Gastrointestinal system: Abdomen soft nontender nondistended Central nervous system: Alert and oriented 3 nonfocal. Extremities: No lower extremity edema. Skin: He has a necrotic open wound. Foul smelling with exposed ligament vein and arteries and nerves.   Data Reviewed:    Labs: Basic Metabolic Panel:  Recent Labs Lab 04/08/17 0618 04/08/17 0954 04/09/17 0510  NA 139 140 141  K 4.0 4.0 4.0  CL 107 107 112*  CO2  --  22 21*  GLUCOSE 96 103* 104*  BUN 26* 26* 20  CREATININE 2.60* 2.25* 1.18*  CALCIUM  --  8.7* 8.6*    GFR Estimated Creatinine Clearance: 35.2 mL/min (A) (by C-G formula based on SCr of 1.18 mg/dL (H)). Liver Function Tests: No results for input(s): AST, ALT, ALKPHOS, BILITOT, PROT, ALBUMIN in the last 168 hours. No results for input(s): LIPASE, AMYLASE in the last 168 hours. No results for input(s): AMMONIA in the last 168 hours. Coagulation profile No results for input(s): INR, PROTIME in the last 168 hours.  CBC:  Recent Labs Lab 04/08/17 0549 04/08/17 0618 04/09/17 0510  WBC 10.1  --  7.3  NEUTROABS 6.7  --   --   HGB 8.9* 8.8* 8.9*  HCT 26.9* 26.0* 28.0*  MCV 78.7  --  78.4  PLT 513*  --  441*   Cardiac Enzymes:  Recent Labs Lab 04/08/17 1006  CKTOTAL 77   BNP (last 3 results) No results for input(s): PROBNP in the last 8760 hours. CBG: No results for input(s): GLUCAP in the last 168 hours. D-Dimer: No results for input(s): DDIMER in the last 72 hours. Hgb A1c: No results for input(s): HGBA1C in the last 72 hours. Lipid Profile: No results for input(s): CHOL, HDL, LDLCALC, TRIG, CHOLHDL, LDLDIRECT in the last 72 hours. Thyroid function studies: No results for input(s): TSH, T4TOTAL, T3FREE, THYROIDAB in the last 72 hours.  Invalid input(s): FREET3 Anemia work up: No results for input(s): VITAMINB12, FOLATE, FERRITIN, TIBC, IRON, RETICCTPCT in the last 72 hours. Sepsis Labs:  Recent Labs Lab 04/08/17 0549 04/08/17 0618 04/09/17 0510  WBC 10.1  --  7.3  LATICACIDVEN  --  0.52  --    Microbiology Recent Results (from the past 240 hour(s))  Blood culture (routine x 2)     Status: None   Collection Time: 03/30/17  9:08 AM  Result Value Ref Range Status   Specimen Description BLOOD RIGHT ANTECUBITAL  Final   Special Requests IN PEDIATRIC BOTTLE Blood Culture adequate volume  Final   Culture   Final    NO GROWTH 5 DAYS Performed at [facility], [place] [address], [place], [place] [address]    Report Status 04/04/2017 FINAL  Final  Culture,  blood (routine x 2)     Status: None   Collection Time: 03/30/17  7:44 PM  Result Value Ref Range Status   Specimen Description BLOOD RIGHT HAND  Final   Special Requests IN PEDIATRIC BOTTLE Blood Culture adequate volume  Final   Culture   Final    NO GROWTH 5 DAYS Performed at [facility], [place] [address] [place], [place] [address]    Report Status 04/05/2017 FINAL  Final  Culture, blood (routine x 2)     Status: None   Collection Time: 03/30/17  8:00 PM  Result Value Ref Range Status   Specimen Description BLOOD LEFT HAND  Final   Special Requests IN PEDIATRIC BOTTLE Blood Culture adequate volume  Final   Culture   Final    NO GROWTH 5 DAYS Performed at [facility], [place] [address], [place], [place] [address]    Report Status 04/05/2017 FINAL  Final  MRSA PCR Screening     Status: None   Collection Time: 03/31/17  3:17 PM  Result Value Ref Range Status   MRSA by PCR NEGATIVE NEGATIVE Final    Comment:        The GeneXpert MRSA Assay (FDA approved for NASAL specimens only), is one component of a comprehensive MRSA colonization surveillance program. It is not intended to  diagnose MRSA infection nor to guide or monitor treatment for MRSA infections.      Medications:   . amLODipine  5 mg Oral Daily  . buPROPion  150 mg Oral Daily  . doxycycline  100 mg Oral Q12H  . feeding supplement (ENSURE ENLIVE)  237 mL Oral BID BM  . ferrous sulfate  325 mg Oral BID WC  . gabapentin  100 mg Oral TID  . heparin  5,000 Units Subcutaneous Q8H  . mirtazapine  7.5 mg Oral QHS  . silver sulfADIAZINE   Topical Daily   Continuous Infusions: . sodium chloride 100 mL/hr at 04/09/17 0646  . ampicillin-sulbactam (UNASYN) IV        LOS: 0 days   Charlynne Cousins  [facility] Pager [phone]  *Please refer to [email or web address], password [email or web address] to get updated schedule on who will round on this patient, as hospitalists switch teams weekly. If 7PM-7AM, please contact  night-coverage at [email or web address], password [email or web address] for any overnight needs.  04/09/2017, 8:46 AM

## 2017-04-09 NOTE — Anesthesia Preprocedure Evaluation (Addendum)
Anesthesia Evaluation  Patient identified by MRN, date of birth, ID band Patient awake    Reviewed: Allergy & Precautions, H&P , NPO status , Patient's Chart, lab work & pertinent test results  Airway Mallampati: II   Neck ROM: full    Dental   Pulmonary Current Smoker,    breath sounds clear to auscultation       Cardiovascular hypertension, Pt. on medications + Peripheral Vascular Disease   Rhythm:regular Rate:Normal     Neuro/Psych  Headaches, PSYCHIATRIC DISORDERS Depression  Neuromuscular disease    GI/Hepatic (+)     substance abuse  cocaine use,   Endo/Other    Renal/GU Renal disease     Musculoskeletal  (+) Arthritis ,   Abdominal   Peds  Hematology  (+) anemia ,   Anesthesia Other Findings   Reproductive/Obstetrics                             Anesthesia Physical  Anesthesia Plan  ASA: III  Anesthesia Plan: Spinal and Regional   Post-op Pain Management:  Regional for Post-op pain   Induction:   PONV Risk Score and Plan: 1 and Ondansetron and Propofol  Airway Management Planned:   Additional Equipment:   Intra-op Plan:   Post-operative Plan:   Informed Consent: I have reviewed the patients History and Physical, chart, labs and discussed the procedure including the risks, benefits and alternatives for the proposed anesthesia with the patient or authorized representative who has indicated his/her understanding and acceptance.   Dental advisory given  Plan Discussed with: CRNA  Anesthesia Plan Comments:        Anesthesia Quick Evaluation

## 2017-04-09 NOTE — Progress Notes (Signed)
Chaplain saw pt in response to spiritual care consult.   Familiar with pt from prior admissions.  Pt was lying in bed, lethargic upon chaplain arrival.  Expressed familiarity with chaplain and welcomed.   She was in and out of sleep during our encounter.    Chaplain provided education around [facility].  Pt expressed that she does want to make her brother HCPOA.  She does not have access to his contact information.  Reported that the number on file in pt's face sheet is not correct, but could not recall brother's phone number.  Continued to fall asleep attempting to recall number.   Pt stated she had spoken with brother, who lives in [place], and chaplain encouraged to get his contact information for HCPOA.   Reported same to RN.   Chaplain spoke with pt about transferring to [facility]  And surgery.  She communicates minimal investment, stating "I guess, that's what they are saying."    Unable to assess and provide support with pt due to lethargy.     Will follow up during admission to [facility] and forward to chaplain service at [facility] to attempt to complete HCPOA and provide further support around upcoming surgery.     [facility] / [place], [place]  Page [phone]  Office [phone]

## 2017-04-09 NOTE — Consult Note (Signed)
ORTHOPAEDIC CONSULTATION  REQUESTING PHYSICIAN: Charlynne Cousins, MD  Chief Complaint: Chronic gangrenous painful ulcers bilateral.  HPI: [name] is a 56 y.o. female who presents with several year history of necrotic gangrenous ulcers posterior calf bilaterally. Patient complains of pain odor drainage necrotic tissue.  Past Medical History:  Diagnosis Date  . CAP (community acquired pneumonia) 03/2014 X 2  . Cocaine abuse    ongoing with resultant vaculitis.  . Depression   . Headache    "weekly" (07/29/2016)  . Hypertension   . Inflammatory arthritis   . Migraines    "probably 5-6/yr" (07/29/2016)  . Normocytic anemia    BL Hgb 9.8-12. Last anemia panel 04/2010 - showing Fe 19, ferritin 101.  Pt on monthly B12 injections  . Rheumatoid arthritis(714.0)    patient reported  . 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 Followed by Dr. Louanne Skye   Past Surgical History:  Procedure Laterality Date  . AMPUTATION Left 05/22/2016   Procedure: AMPUTATION LEFT LONG FINGER;  Surgeon: Marybelle Killings, MD;  Location: [facility];  Service: Orthopedics;  Laterality: Left;  . HERNIA REPAIR     "stomach"  . I&D EXTREMITY Right 09/26/2015   Procedure: IRRIGATION AND DEBRIDEMENT LEG WOUND  VAC PLACEMENT.;  Surgeon: Loel Lofty Dillingham, DO;  Location: [facility];  Service: Plastics;  Laterality: Right;  . INCISION AND DRAINAGE OF WOUND Bilateral 10/20/2016   Procedure: IRRIGATION AND DEBRIDEMENT WOUND BILATERAL;  Surgeon: Edrick Kins, DPM;  Location: [facility];  Service: Podiatry;  Laterality: Bilateral;  . IRRIGATION AND DEBRIDEMENT ABSCESS Bilateral 09/26/2013   Procedure: DEBRIDEMENT ULCERS BILATERAL THIGHS;  Surgeon: Gwenyth Ober, MD;  Location: [facility];  Service: General;  Laterality: Bilateral;  . SKIN BIOPSY Bilateral    shin nodules   Social History   Social History  . Marital status: Single    Spouse name: N/A  . Number of children: 0   . Years of education: 11th grade   Occupational History  . Disability     since 2011, due to her rheumatoid arthritis   Social History Main Topics  . Smoking status: Current Every Day Smoker    Packs/day: 0.12    Years: 38.00    Types: Cigarettes  . Smokeless tobacco: Never Used     Comment: 2 A DAY  . Alcohol use No  . Drug use: Yes    Types: "Crack" cocaine, Cocaine     Comment: 07/29/2016 "only use the one you smoke now., last used ~ 1-2 wk ago"  . Sexual activity: Not Currently   Other Topics Concern  . None   Social History Narrative   Unemployed:  cleaning in past   Living at [facility]; has Medicaid.   crack/cocaine use; pt denies IVDU   tobacco:  1/2 ppd, trying to quit   alcohol:  none       Family History  Problem Relation Age of Onset  . Breast cancer Mother        Breast cancer  . Alcohol abuse Mother   . Colon cancer Maternal Aunt 48  . Alcohol abuse Father    - negative except otherwise stated in the family history section Allergies  Allergen Reactions  . Acetaminophen Swelling and Other (See Comments)    Reaction:  Eyelid swelling  . Other Other (See Comments)    Pt states she is allergic to a blood pressure  medication, but does not know the reaction or medication   Prior to Admission medications   Medication Sig Start Date End Date Taking? Authorizing Provider  naproxen sodium (ANAPROX) 220 MG tablet Take 220 mg by mouth 2 (two) times daily with a meal.   Yes [provider]  ferrous sulfate 325 (65 FE) MG tablet Take 1 tablet (325 mg total) by mouth 2 (two) times daily with a meal. Patient not taking: Reported on 03/30/2017 12/28/16   Molt, Bethany, DO  Olmesartan-Amlodipine-HCTZ (TRIBENZOR) 40-10-12.5 MG TABS Take 1 tablet by mouth daily. Patient not taking: Reported on 04/08/2017 04/01/17   Mariel Aloe, MD   Dg Chest 2 View  Result Date: 04/08/2017 CLINICAL DATA:  Infection EXAM: CHEST  2 VIEW COMPARISON:  Chest radiograph  10/08/2016 FINDINGS: Normal cardiomediastinal contours. No pulmonary edema or focal consolidation. No pleural effusion or pneumothorax. There are bilateral round opacities overlying the mid thorax, which may be nipple shadows. IMPRESSION: 1. No active cardiopulmonary disease. 2. Bilateral round opacities of the mid chest, likely nipple shadows. Repeat radiograph with nipple markers would be confirmatory. Electronically Signed   By: Ulyses Jarred M.D.   On: 04/08/2017 06:48   Dg Tibia/fibula Left  Result Date: 04/08/2017 CLINICAL DATA:  Open wounds at the left leg, with severe pain. Initial encounter. EXAM: LEFT TIBIA AND FIBULA - 2 VIEW COMPARISON:  Left tibia/fibula radiographs performed 03/30/2017 FINDINGS: There is no evidence of fracture or dislocation. The tibia and fibula appear grossly intact. The ankle mortise is unremarkable in appearance. The knee joint is unremarkable. No knee joint effusion is identified. A large soft tissue wound is noted overlying the expected location of the left Achilles tendon. No definite osseous erosions are seen at this time. IMPRESSION: 1. No evidence of fracture or dislocation. No definite osseous erosions seen. 2. Large soft tissue wound overlying the expected location of the Achilles tendon. Electronically Signed   By: Garald Balding M.D.   On: 04/08/2017 06:49   Dg Tibia/fibula Right  Result Date: 04/08/2017 CLINICAL DATA:  Open wounds at the right lower leg, with severe pain. Initial encounter. EXAM: RIGHT TIBIA AND FIBULA - 2 VIEW COMPARISON:  Right tibia/fibula radiographs performed 03/30/2017 FINDINGS: There is no evidence of fracture or dislocation. The tibia and fibula appear intact. The ankle mortise is incompletely assessed, but appears grossly unremarkable. No knee joint effusion is identified. Prominent soft tissue wounds are noted along the posterior lower leg. No definite osseous erosions are seen. IMPRESSION: 1. No evidence of fracture or dislocation. No  definite osseous erosion seen. 2. Prominent soft tissue wounds along the posterior lower leg. Electronically Signed   By: Garald Balding M.D.   On: 04/08/2017 06:50   - pertinent xrays, CT, MRI studies were reviewed and independently interpreted  Positive ROS: All other systems have been reviewed and were otherwise negative with the exception of those mentioned in the HPI and as above.  Physical Exam: General: Alert, no acute distress Psychiatric: Patient is competent for consent with normal mood and affect Lymphatic: No axillary or cervical lymphadenopathy Cardiovascular: No pedal edema Respiratory: No cyanosis, no use of accessory musculature GI: No organomegaly, abdomen is soft and non-tender  Skin: Patient has 2 necrotic ulcers on the posterior aspect of her calves left worse than right. There is necrotic Achilles tendon there is exposed bone the ulcers are approximately 15 x 10 cm and extended down to necrotic bone and tendon. There is no healthy viable tissue there is  no abscess there is no ascending cellulitis. Patient has a strong bilateral dorsalis pedis pulse.   Neurologic: Patient does not have protective sensation bilateral lower extremities.   MUSCULOSKELETAL:  Patient is thin and cachectic. She has severe protein caloric malnutrition. She has good arterial inflow to both lower extremities but has chronic necrotic ulcers of both calves and does not have a limb salvage options.  Assessment: Assessment: Bilateral calf necrotic ulcers with necrotic tendon muscle and exposed bone.  Plan: Plan: Patient's only option is for a transtibial amputation. Risks and benefits were discussed including risk of the wound not healing due to her protein caloric malnutrition potential for higher level amputation. Patient states she understands she will discuss this with her brother we will plan for surgery tomorrow Saturday morning.  Thank you for the consult and the opportunity to see Ms.  Burns Spain, [place] [phone] 6:47 PM

## 2017-04-10 ENCOUNTER — Other Ambulatory Visit: Payer: Self-pay

## 2017-04-10 ENCOUNTER — Inpatient Hospital Stay (HOSPITAL_COMMUNITY): Payer: Medicaid Other | Admitting: Anesthesiology

## 2017-04-10 ENCOUNTER — Encounter (HOSPITAL_COMMUNITY)
Admission: EM | Disposition: A | Discharge status: Rehabilitation Facility | Payer: Self-pay | Source: Home / Self Care | Attending: Internal Medicine

## 2017-04-10 ENCOUNTER — Encounter (HOSPITAL_COMMUNITY): Payer: Self-pay | Admitting: Certified Registered Nurse Anesthetist

## 2017-04-10 DIAGNOSIS — L089 Local infection of the skin and subcutaneous tissue, unspecified: Secondary | ICD-10-CM

## 2017-04-10 DIAGNOSIS — N179 Acute kidney failure, unspecified: Secondary | ICD-10-CM

## 2017-04-10 DIAGNOSIS — F14988 Cocaine use, unspecified with other cocaine-induced disorder: Secondary | ICD-10-CM

## 2017-04-10 DIAGNOSIS — I10 Essential (primary) hypertension: Secondary | ICD-10-CM

## 2017-04-10 DIAGNOSIS — I999 Unspecified disorder of circulatory system: Secondary | ICD-10-CM

## 2017-04-10 DIAGNOSIS — T148XXA Other injury of unspecified body region, initial encounter: Secondary | ICD-10-CM

## 2017-04-10 DIAGNOSIS — F1420 Cocaine dependence, uncomplicated: Secondary | ICD-10-CM

## 2017-04-10 DIAGNOSIS — I70262 Atherosclerosis of native arteries of extremities with gangrene, left leg: Secondary | ICD-10-CM

## 2017-04-10 DIAGNOSIS — E43 Unspecified severe protein-calorie malnutrition: Secondary | ICD-10-CM

## 2017-04-10 HISTORY — PX: AMPUTATION: SHX166

## 2017-04-10 LAB — CBC
HCT: 26.1 % — ABNORMAL LOW (ref 36.0–46.0)
HEMOGLOBIN: 8.1 g/dL — AB (ref 12.0–15.0)
MCH: 25 pg — ABNORMAL LOW (ref 26.0–34.0)
MCHC: 31 g/dL (ref 30.0–36.0)
MCV: 80.6 fL (ref 78.0–100.0)
Platelets: 450 10*3/uL — ABNORMAL HIGH (ref 150–400)
RBC: 3.24 MIL/uL — AB (ref 3.87–5.11)
RDW: 16.5 % — ABNORMAL HIGH (ref 11.5–15.5)
WBC: 6.3 10*3/uL (ref 4.0–10.5)

## 2017-04-10 LAB — BASIC METABOLIC PANEL
ANION GAP: 7 (ref 5–15)
BUN: 17 mg/dL (ref 6–20)
CHLORIDE: 112 mmol/L — AB (ref 101–111)
CO2: 21 mmol/L — ABNORMAL LOW (ref 22–32)
Calcium: 8.7 mg/dL — ABNORMAL LOW (ref 8.9–10.3)
Creatinine, Ser: 0.91 mg/dL (ref 0.44–1.00)
GFR calc non Af Amer: >60 mL/min (ref >60)
Glucose, Bld: 101 mg/dL — ABNORMAL HIGH (ref 65–99)
POTASSIUM: 3.7 mmol/L (ref 3.5–5.1)
SODIUM: 140 mmol/L (ref 135–145)

## 2017-04-10 LAB — SURGICAL PCR SCREEN
MRSA, PCR: NEGATIVE
Staphylococcus aureus: NEGATIVE

## 2017-04-10 SURGERY — AMPUTATION BELOW KNEE
Anesthesia: Regional | Site: Leg Lower | Laterality: Bilateral

## 2017-04-10 MED ORDER — 0.9 % SODIUM CHLORIDE (POUR BTL) OPTIME
TOPICAL | Status: DC | PRN
  Administered 2017-04-10: 1000 mL

## 2017-04-10 MED ORDER — HYDROMORPHONE HCL 1 MG/ML IJ SOLN: 0.2500 mg | INTRAMUSCULAR | Status: DC | PRN

## 2017-04-10 MED ORDER — MIDAZOLAM HCL 2 MG/2ML IJ SOLN
INTRAMUSCULAR | Status: AC
  Filled 2017-04-10: qty 2

## 2017-04-10 MED ORDER — PHENYLEPHRINE HCL 10 MG/ML IJ SOLN
INTRAVENOUS | Status: DC | PRN
  Administered 2017-04-10: 25 ug/min via INTRAVENOUS

## 2017-04-10 MED ORDER — BUPIVACAINE-EPINEPHRINE (PF) 0.5% -1:200000 IJ SOLN
INTRAMUSCULAR | Status: DC | PRN
  Administered 2017-04-10: 30 mL via PERINEURAL

## 2017-04-10 MED ORDER — SODIUM CHLORIDE 0.9 % IV SOLN
INTRAVENOUS | Status: DC
  Administered 2017-04-10 – 2017-04-12 (×5): via INTRAVENOUS

## 2017-04-10 MED ORDER — METOCLOPRAMIDE HCL 10 MG PO TABS: 5.0000 mg | ORAL_TABLET | Freq: Three times a day (TID) | ORAL | Status: DC | PRN

## 2017-04-10 MED ORDER — ONDANSETRON HCL 4 MG/2ML IJ SOLN
INTRAMUSCULAR | Status: DC | PRN
  Administered 2017-04-10: 4 mg via INTRAVENOUS

## 2017-04-10 MED ORDER — ONDANSETRON HCL 4 MG/2ML IJ SOLN: 4.0000 mg | Freq: Four times a day (QID) | INTRAMUSCULAR | Status: DC | PRN

## 2017-04-10 MED ORDER — METOCLOPRAMIDE HCL 5 MG/ML IJ SOLN: 5.0000 mg | Freq: Three times a day (TID) | INTRAMUSCULAR | Status: DC | PRN

## 2017-04-10 MED ORDER — PROPOFOL 500 MG/50ML IV EMUL
INTRAVENOUS | Status: DC | PRN
  Administered 2017-04-10: 10 ug/kg/min via INTRAVENOUS

## 2017-04-10 MED ORDER — PHENYLEPHRINE HCL 10 MG/ML IJ SOLN
INTRAMUSCULAR | Status: DC | PRN
  Administered 2017-04-10: 40 ug via INTRAVENOUS
  Administered 2017-04-10: 80 ug via INTRAVENOUS
  Administered 2017-04-10 (×2): 40 ug via INTRAVENOUS

## 2017-04-10 MED ORDER — PROPOFOL 10 MG/ML IV BOLUS
INTRAVENOUS | Status: AC
  Filled 2017-04-10: qty 40

## 2017-04-10 MED ORDER — MEPERIDINE HCL 25 MG/ML IJ SOLN: 6.2500 mg | INTRAMUSCULAR | Status: DC | PRN

## 2017-04-10 MED ORDER — OXYCODONE HCL 5 MG PO TABS
5.0000 mg | ORAL_TABLET | ORAL | Status: DC | PRN
  Administered 2017-04-10 – 2017-04-13 (×5): 10 mg via ORAL
  Filled 2017-04-10 (×5): qty 2

## 2017-04-10 MED ORDER — METHOCARBAMOL 500 MG PO TABS: 500.0000 mg | ORAL_TABLET | Freq: Four times a day (QID) | ORAL | Status: DC | PRN

## 2017-04-10 MED ORDER — METHOCARBAMOL 1000 MG/10ML IJ SOLN
500.0000 mg | Freq: Four times a day (QID) | INTRAVENOUS | Status: DC | PRN
  Filled 2017-04-10: qty 5

## 2017-04-10 MED ORDER — LACTATED RINGERS IV SOLN
INTRAVENOUS | Status: DC | PRN
  Administered 2017-04-10: 07:00:00 via INTRAVENOUS

## 2017-04-10 MED ORDER — LIDOCAINE HCL (PF) 2 % IJ SOLN
INTRAMUSCULAR | Status: DC | PRN
  Administered 2017-04-10: 20 mL via INTRADERMAL

## 2017-04-10 MED ORDER — MAGNESIUM CITRATE PO SOLN: 1.0000 | Freq: Once | ORAL | Status: DC | PRN

## 2017-04-10 MED ORDER — PROPOFOL 10 MG/ML IV BOLUS
INTRAVENOUS | Status: DC | PRN
  Administered 2017-04-10: 20 mg via INTRAVENOUS
  Administered 2017-04-10 (×2): 10 mg via INTRAVENOUS

## 2017-04-10 MED ORDER — PROMETHAZINE HCL 25 MG/ML IJ SOLN
6.2500 mg | INTRAMUSCULAR | Status: DC | PRN
Start: 2017-04-10 — End: 2017-04-10

## 2017-04-10 MED ORDER — DOCUSATE SODIUM 100 MG PO CAPS
100.0000 mg | ORAL_CAPSULE | Freq: Two times a day (BID) | ORAL | Status: DC
  Administered 2017-04-10 – 2017-04-13 (×7): 100 mg via ORAL
  Filled 2017-04-10 (×7): qty 1

## 2017-04-10 MED ORDER — ONDANSETRON HCL 4 MG PO TABS: 4.0000 mg | ORAL_TABLET | Freq: Four times a day (QID) | ORAL | Status: DC | PRN

## 2017-04-10 MED ORDER — BISACODYL 10 MG RE SUPP: 10.0000 mg | Freq: Every day | RECTAL | Status: DC | PRN

## 2017-04-10 MED ORDER — HYDROMORPHONE HCL 1 MG/ML IJ SOLN
1.0000 mg | INTRAMUSCULAR | Status: DC | PRN
  Administered 2017-04-10 – 2017-04-13 (×16): 1 mg via INTRAVENOUS
  Filled 2017-04-10 (×16): qty 1

## 2017-04-10 MED ORDER — MIDAZOLAM HCL 2 MG/2ML IJ SOLN
INTRAMUSCULAR | Status: DC | PRN
  Administered 2017-04-10: 1 mg via INTRAVENOUS

## 2017-04-10 MED ORDER — FENTANYL CITRATE (PF) 100 MCG/2ML IJ SOLN
INTRAMUSCULAR | Status: DC | PRN
  Administered 2017-04-10: 25 ug via INTRAVENOUS

## 2017-04-10 MED ORDER — POLYETHYLENE GLYCOL 3350 17 G PO PACK
17.0000 g | PACK | Freq: Every day | ORAL | Status: DC | PRN
  Administered 2017-04-11: 17 g via ORAL
  Filled 2017-04-10: qty 1

## 2017-04-10 MED ORDER — FENTANYL CITRATE (PF) 250 MCG/5ML IJ SOLN
INTRAMUSCULAR | Status: AC
  Filled 2017-04-10: qty 5

## 2017-04-10 SURGICAL SUPPLY — 34 items
BLADE SAW RECIP 87.9 MT (BLADE) ×3 IMPLANT
BLADE SURG 21 STRL SS (BLADE) ×5 IMPLANT
BNDG COHESIVE 6X5 TAN STRL LF (GAUZE/BANDAGES/DRESSINGS) ×2 IMPLANT
BNDG GAUZE ELAST 4 BULKY (GAUZE/BANDAGES/DRESSINGS) ×2 IMPLANT
CANISTER WOUND CARE 500ML ATS (WOUND CARE) ×4 IMPLANT
COVER SURGICAL LIGHT HANDLE (MISCELLANEOUS) ×3 IMPLANT
CUFF TOURNIQUET SINGLE 34IN LL (TOURNIQUET CUFF) ×4 IMPLANT
CUFF TOURNIQUET SINGLE 44IN (TOURNIQUET CUFF) IMPLANT
DRAPE INCISE IOBAN 66X45 STRL (DRAPES) ×4 IMPLANT
DRAPE U-SHAPE 47X51 STRL (DRAPES) ×5 IMPLANT
DRESSING VERAFLO CLEANSE CC (GAUZE/BANDAGES/DRESSINGS) IMPLANT
DRSG VAC ATS MED SENSATRAC (GAUZE/BANDAGES/DRESSINGS) ×1 IMPLANT
DRSG VERAFLO CLEANSE CC (GAUZE/BANDAGES/DRESSINGS) ×3
ELECT REM PT RETURN 9FT ADLT (ELECTROSURGICAL) ×3
ELECTRODE REM PT RTRN 9FT ADLT (ELECTROSURGICAL) ×1 IMPLANT
GLOVE BIOGEL PI IND STRL 9 (GLOVE) ×1 IMPLANT
GLOVE BIOGEL PI INDICATOR 9 (GLOVE) ×2
GLOVE SURG ORTHO 9.0 STRL STRW (GLOVE) ×5 IMPLANT
GOWN STRL REUS W/ TWL XL LVL3 (GOWN DISPOSABLE) ×2 IMPLANT
GOWN STRL REUS W/TWL XL LVL3 (GOWN DISPOSABLE) ×6
KIT BASIN OR (CUSTOM PROCEDURE TRAY) ×3 IMPLANT
KIT ROOM TURNOVER OR (KITS) ×3 IMPLANT
MANIFOLD NEPTUNE II (INSTRUMENTS) ×3 IMPLANT
NS IRRIG 1000ML POUR BTL (IV SOLUTION) ×3 IMPLANT
PACK ORTHO EXTREMITY (CUSTOM PROCEDURE TRAY) ×3 IMPLANT
PAD ARMBOARD 7.5X6 YLW CONV (MISCELLANEOUS) ×3 IMPLANT
PAD NEG PRESSURE SENSATRAC (MISCELLANEOUS) ×2 IMPLANT
SPONGE LAP 18X18 X RAY DECT (DISPOSABLE) ×2 IMPLANT
STAPLER VISISTAT 35W (STAPLE) ×2 IMPLANT
STOCKINETTE IMPERVIOUS LG (DRAPES) ×5 IMPLANT
SUT SILK 2 0 (SUTURE) ×3
SUT SILK 2-0 18XBRD TIE 12 (SUTURE) ×1 IMPLANT
SUT VIC AB 1 CTX 27 (SUTURE) ×8 IMPLANT
TOWEL OR 17X26 10 PK STRL BLUE (TOWEL DISPOSABLE) ×3 IMPLANT

## 2017-04-10 NOTE — Anesthesia Procedure Notes (Signed)
Spinal  Patient location during procedure: OR Staffing Anesthesiologist: Nolon Nations Performed: anesthesiologist  Preanesthetic Checklist Completed: patient identified, site marked, surgical consent, pre-op evaluation, timeout performed, IV checked, risks and benefits discussed and monitors and equipment checked Spinal Block Patient position: sitting Prep: ChloraPrep and site prepped and draped Patient monitoring: heart rate, continuous pulse ox, blood pressure and cardiac monitor Approach: right paramedian Location: L4-5 Injection technique: single-shot Needle Needle type: Sprotte  Needle gauge: 24 G Needle length: 9 cm Additional Notes Expiration date of kit checked and confirmed. Patient tolerated procedure well, without complications.

## 2017-04-10 NOTE — H&P (View-Only) (Signed)
ORTHOPAEDIC CONSULTATION  REQUESTING PHYSICIAN: Charlynne Cousins, MD  Chief Complaint: Chronic gangrenous painful ulcers bilateral.  HPI: [name] is a 56 y.o. female who presents with several year history of necrotic gangrenous ulcers posterior calf bilaterally. Patient complains of pain odor drainage necrotic tissue.  Past Medical History:  Diagnosis Date  . CAP (community acquired pneumonia) 03/2014 X 2  . Cocaine abuse    ongoing with resultant vaculitis.  . Depression   . Headache    "weekly" (07/29/2016)  . Hypertension   . Inflammatory arthritis   . Migraines    "probably 5-6/yr" (07/29/2016)  . Normocytic anemia    BL Hgb 9.8-12. Last anemia panel 04/2010 - showing Fe 19, ferritin 101.  Pt on monthly B12 injections  . Rheumatoid arthritis(714.0)    patient reported  . 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 Followed by Dr. Louanne Skye   Past Surgical History:  Procedure Laterality Date  . AMPUTATION Left 05/22/2016   Procedure: AMPUTATION LEFT LONG FINGER;  Surgeon: Marybelle Killings, MD;  Location: [facility];  Service: Orthopedics;  Laterality: Left;  . HERNIA REPAIR     "stomach"  . I&D EXTREMITY Right 09/26/2015   Procedure: IRRIGATION AND DEBRIDEMENT LEG WOUND  VAC PLACEMENT.;  Surgeon: Loel Lofty Dillingham, DO;  Location: [facility];  Service: Plastics;  Laterality: Right;  . INCISION AND DRAINAGE OF WOUND Bilateral 10/20/2016   Procedure: IRRIGATION AND DEBRIDEMENT WOUND BILATERAL;  Surgeon: Edrick Kins, DPM;  Location: [facility];  Service: Podiatry;  Laterality: Bilateral;  . IRRIGATION AND DEBRIDEMENT ABSCESS Bilateral 09/26/2013   Procedure: DEBRIDEMENT ULCERS BILATERAL THIGHS;  Surgeon: Gwenyth Ober, MD;  Location: [facility];  Service: General;  Laterality: Bilateral;  . SKIN BIOPSY Bilateral    shin nodules   Social History   Social History  . Marital status: Single    Spouse name: N/A  . Number of children: 0   . Years of education: 11th grade   Occupational History  . Disability     since 2011, due to her rheumatoid arthritis   Social History Main Topics  . Smoking status: Current Every Day Smoker    Packs/day: 0.12    Years: 38.00    Types: Cigarettes  . Smokeless tobacco: Never Used     Comment: 2 A DAY  . Alcohol use No  . Drug use: Yes    Types: "Crack" cocaine, Cocaine     Comment: 07/29/2016 "only use the one you smoke now., last used ~ 1-2 wk ago"  . Sexual activity: Not Currently   Other Topics Concern  . None   Social History Narrative   Unemployed:  cleaning in past   Living at [facility]; has Medicaid.   crack/cocaine use; pt denies IVDU   tobacco:  1/2 ppd, trying to quit   alcohol:  none       Family History  Problem Relation Age of Onset  . Breast cancer Mother        Breast cancer  . Alcohol abuse Mother   . Colon cancer Maternal Aunt 79  . Alcohol abuse Father    - negative except otherwise stated in the family history section Allergies  Allergen Reactions  . Acetaminophen Swelling and Other (See Comments)    Reaction:  Eyelid swelling  . Other Other (See Comments)    Pt states she is allergic to a blood pressure  medication, but does not know the reaction or medication   Prior to Admission medications   Medication Sig Start Date End Date Taking? Authorizing Provider  naproxen sodium (ANAPROX) 220 MG tablet Take 220 mg by mouth 2 (two) times daily with a meal.   Yes [provider]  ferrous sulfate 325 (65 FE) MG tablet Take 1 tablet (325 mg total) by mouth 2 (two) times daily with a meal. Patient not taking: Reported on 03/30/2017 12/28/16   Molt, Bethany, DO  Olmesartan-Amlodipine-HCTZ (TRIBENZOR) 40-10-12.5 MG TABS Take 1 tablet by mouth daily. Patient not taking: Reported on 04/08/2017 04/01/17   Mariel Aloe, MD   Dg Chest 2 View  Result Date: 04/08/2017 CLINICAL DATA:  Infection EXAM: CHEST  2 VIEW COMPARISON:  Chest radiograph  10/08/2016 FINDINGS: Normal cardiomediastinal contours. No pulmonary edema or focal consolidation. No pleural effusion or pneumothorax. There are bilateral round opacities overlying the mid thorax, which may be nipple shadows. IMPRESSION: 1. No active cardiopulmonary disease. 2. Bilateral round opacities of the mid chest, likely nipple shadows. Repeat radiograph with nipple markers would be confirmatory. Electronically Signed   By: Ulyses Jarred M.D.   On: 04/08/2017 06:48   Dg Tibia/fibula Left  Result Date: 04/08/2017 CLINICAL DATA:  Open wounds at the left leg, with severe pain. Initial encounter. EXAM: LEFT TIBIA AND FIBULA - 2 VIEW COMPARISON:  Left tibia/fibula radiographs performed 03/30/2017 FINDINGS: There is no evidence of fracture or dislocation. The tibia and fibula appear grossly intact. The ankle mortise is unremarkable in appearance. The knee joint is unremarkable. No knee joint effusion is identified. A large soft tissue wound is noted overlying the expected location of the left Achilles tendon. No definite osseous erosions are seen at this time. IMPRESSION: 1. No evidence of fracture or dislocation. No definite osseous erosions seen. 2. Large soft tissue wound overlying the expected location of the Achilles tendon. Electronically Signed   By: Garald Balding M.D.   On: 04/08/2017 06:49   Dg Tibia/fibula Right  Result Date: 04/08/2017 CLINICAL DATA:  Open wounds at the right lower leg, with severe pain. Initial encounter. EXAM: RIGHT TIBIA AND FIBULA - 2 VIEW COMPARISON:  Right tibia/fibula radiographs performed 03/30/2017 FINDINGS: There is no evidence of fracture or dislocation. The tibia and fibula appear intact. The ankle mortise is incompletely assessed, but appears grossly unremarkable. No knee joint effusion is identified. Prominent soft tissue wounds are noted along the posterior lower leg. No definite osseous erosions are seen. IMPRESSION: 1. No evidence of fracture or dislocation. No  definite osseous erosion seen. 2. Prominent soft tissue wounds along the posterior lower leg. Electronically Signed   By: Garald Balding M.D.   On: 04/08/2017 06:50   - pertinent xrays, CT, MRI studies were reviewed and independently interpreted  Positive ROS: All other systems have been reviewed and were otherwise negative with the exception of those mentioned in the HPI and as above.  Physical Exam: General: Alert, no acute distress Psychiatric: Patient is competent for consent with normal mood and affect Lymphatic: No axillary or cervical lymphadenopathy Cardiovascular: No pedal edema Respiratory: No cyanosis, no use of accessory musculature GI: No organomegaly, abdomen is soft and non-tender  Skin: Patient has 2 necrotic ulcers on the posterior aspect of her calves left worse than right. There is necrotic Achilles tendon there is exposed bone the ulcers are approximately 15 x 10 cm and extended down to necrotic bone and tendon. There is no healthy viable tissue there is  no abscess there is no ascending cellulitis. Patient has a strong bilateral dorsalis pedis pulse.   Neurologic: Patient does not have protective sensation bilateral lower extremities.   MUSCULOSKELETAL:  Patient is thin and cachectic. She has severe protein caloric malnutrition. She has good arterial inflow to both lower extremities but has chronic necrotic ulcers of both calves and does not have a limb salvage options.  Assessment: Assessment: Bilateral calf necrotic ulcers with necrotic tendon muscle and exposed bone.  Plan: Plan: Patient's only option is for a transtibial amputation. Risks and benefits were discussed including risk of the wound not healing due to her protein caloric malnutrition potential for higher level amputation. Patient states she understands she will discuss this with her brother we will plan for surgery tomorrow Saturday morning.  Thank you for the consult and the opportunity to see Ms.  Burns Spain, [place] [phone] 6:47 PM

## 2017-04-10 NOTE — Plan of Care (Signed)
Problem: Skin Integrity: Goal: Risk for impaired skin integrity will decrease Outcome: Progressing Pt will be repositioned or turned q 2 hrs to prevent pressure injuries.  Problem: Activity: Goal: Will remain free from falls Outcome: Progressing Pt will remain free from falls and injuries during this hospitalization.  Problem: Pain Management: Goal: Pain level will decrease with appropriate interventions Outcome: Progressing Pt will be at tolerable pain goal prior to discharge.

## 2017-04-10 NOTE — Interval H&P Note (Signed)
History and Physical Interval Note:  04/10/2017 7:32 AM  [name]  has presented today for surgery, with the diagnosis of cellulitis of lower extremities  The various methods of treatment have been discussed with the patient and family. After consideration of risks, benefits and other options for treatment, the patient has consented to  Procedure(s): AMPUTATION BELOW KNEE (Bilateral) as a surgical intervention .  The patient's history has been reviewed, patient examined, no change in status, stable for surgery.  I have reviewed the patient's chart and labs.  Questions were answered to the patient's satisfaction.     Newt Minion

## 2017-04-10 NOTE — Anesthesia Postprocedure Evaluation (Signed)
Anesthesia Post Note  Patient: [name]  Procedure(s) Performed: Procedure(s) (LRB): AMPUTATION BELOW KNEE (Bilateral)     Patient location during evaluation: PACU Anesthesia Type: Regional and Spinal Level of consciousness: awake and alert Pain management: pain level controlled Vital Signs Assessment: post-procedure vital signs reviewed and stable Respiratory status: spontaneous breathing and respiratory function stable Cardiovascular status: blood pressure returned to baseline and stable Postop Assessment: spinal receding Anesthetic complications: no    Last Vitals:  Vitals:   04/10/17 1015 04/10/17 1033  BP: 104/76 109/75  Pulse: 85 74  Resp: 16 16  Temp: (!) 36.3 C (!) 36.4 C    Last Pain:  Vitals:   04/10/17 1033  TempSrc: Oral  PainSc:                  Nolon Nations

## 2017-04-10 NOTE — Progress Notes (Signed)
Pt requested the presence of the brother to sign the consent form. Brother was called 04/09/17 PM. Brother reported he would be coming to see her this AM, 04/10/17. Upon arrival, pt's brother had a question about pt just having being discharged had to come back on this admission to have an amputation done. Thy said they would like to talk to the MD prior to signing the consent form. The On call Orthopedic surgeon was called, he advised to call the OR to inform them about the situation. OR was called. OR was informed and were informed that the pt would like to talk with MD prior to signing the consent. Brother was called upon the transporter's arrival. However, brother says he would come to see the pt after the procedure. Will continue to monitor.

## 2017-04-10 NOTE — Progress Notes (Signed)
[facility] PROGRESS NOTE    Progress Note  [name]  WER:[id number] DOB: 26-Jul-1961 DOA: 04/08/2017 PCP: Patient, No Pcp Per     Brief Narrative:   [name] is an 56 y.o. 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 Nicholes Stairs:  With gangrenous changes who failed outpatient treatment because of noncompliance and lack of follow-up with the wound care clinic with the frequent admissions in the past for the same reason who continues to do drugs. She was taken to the OR today for right below the knee amputation continue the IV antibiotics for now and pain control and follow-up with the surgery and wound care.   Chronic pain management: Avoid any IV drugs as possible .  Probably substance abuse/Cocaine use disorder, severe, dependence ([place])  With active cocaine use, psychiatry was consulted and recommended to start Wellbutrin. Social and consulted for substance abuse.   Elevated hypertension: Likely due to cocaine, agree with Norvasc will use clonidine when necessary.  Acute kidney injury: Acute prerenal in etiology, improving with IV fluid hydration continue normal saline.   DVT prophylaxis: lovenox Family Communication:none Disposition Plan/Barrier to D/C: unable to detemrine Code Status:     Code Status Orders        Start     Ordered   04/08/17 0834  Full code  Continuous     04/08/17 0834    Code Status History    Date Active Date Inactive Code Status Order ID Comments User Context   03/30/2017  6:51 PM 04/01/2017  8:24 PM Full Code [id number]  Verlee Monte, MD Inpatient   02/06/2017  6:05 AM 02/08/2017  8:31 PM Full Code [id number]  Riccardo Dubin, MD ED   12/22/2016  9:31 PM 12/28/2016 10:34 PM Full Code  [id number]  Burgess Estelle, MD Inpatient   10/14/2016  3:25 PM 10/24/2016 10:53 PM Full Code [id number]  Jule Ser, DO Inpatient   09/21/2016  1:07 AM 09/22/2016  7:10 PM Full Code [id number]  Junius Creamer, NP ED   09/09/2016  5:22 PM 09/11/2016  5:01 PM Full Code [id number]  Juliet Rude, MD Inpatient   07/25/2016  5:25 AM 07/29/2016  9:20 PM Full Code [id number]  Juliet Rude, MD Inpatient   06/14/2016  3:13 PM 06/19/2016  7:31 PM Full Code [id number]  Florinda Marker, MD Inpatient   05/18/2016  7:59 PM 05/23/2016  5:17 PM Full Code [id number]  Juliet Rude, MD Inpatient   04/27/2016  9:11 AM 05/01/2016  7:16 PM Full Code [id number]  Florinda Marker, MD Inpatient   04/01/2016  5:27 PM 04/04/2016  4:51 PM Full Code [id number]  Jones Bales, MD Inpatient   11/14/2015 11:16 PM 11/15/2015  5:29 PM Full Code [id number]  Juluis Mire, MD ED   10/23/2015  4:15 PM 10/29/2015  4:36 PM Full Code [id number]  Juliet Rude, MD Inpatient   09/25/2015  5:36 AM 10/02/2015  7:11 PM Full Code [id number]  Riccardo Dubin, MD ED   06/15/2015  1:47 AM 06/17/2015  8:39 PM Full Code [id number]  Vickii Chafe, MD Inpatient   06/04/2015  8:45 PM 06/05/2015  9:33 PM Full Code [id number]  Corky Sox, MD Inpatient   04/21/2015  2:05 PM 04/22/2015 10:25  PM Full Code [id number]  Corky Sox, MD Inpatient   04/08/2015  1:01 AM 04/08/2015  8:15 PM Full Code [id number]  Dellia Nims, MD Inpatient   03/09/2015  9:35 PM 03/12/2015 12:31 AM Full Code [id number]  Karlene Einstein, MD Inpatient   01/14/2015  5:34 PM 01/17/2015  1:10 AM Full Code [id number]  Juliet Rude, MD Inpatient   10/09/2014 10:45 PM 10/17/2014  5:38 PM Full Code [id number]  Laverle Hobby, PA-C Inpatient   10/09/2014  3:00 AM 10/09/2014 10:45 PM Full Code [id number]  Everlene Balls, MD ED   05/07/2014  5:09 PM 05/08/2014  7:23 PM Full Code [id number]  Benjamine Mola, FNP Inpatient   05/07/2014  8:35 AM 05/07/2014  5:09 PM Full Code [id number]  Ernestina Patches, MD ED   05/07/2014  1:08 AM  05/07/2014  8:35 AM Full Code [id number]  Carmin Muskrat, MD ED   04/19/2014  5:26 PM 04/22/2014  2:22 PM Full Code [id number]  Clinton Gallant, MD Inpatient   03/30/2014  3:27 AM 03/31/2014  8:08 PM Full Code [id number]  Cresenciano Genre, MD Inpatient   01/13/2014  6:23 PM 01/26/2014  4:51 PM Full Code [id number]  Waylan Boga, NP Inpatient   01/13/2014  4:57 AM 01/13/2014  6:23 PM Full Code [id number]  Garald Balding, NP ED   10/11/2013  4:50 AM 10/17/2013  8:05 PM Full Code [id number]  Blain Pais, MD ED   09/24/2013  6:52 PM 10/02/2013  6:07 PM Full Code [id number]  Blain Pais, MD Inpatient   08/30/2013  4:06 AM 08/30/2013 11:40 PM Full Code [id number]  Martie Lee, PA-C ED   06/23/2013  9:05 PM 06/28/2013  5:56 PM Full Code [id number]  Delfin Gant, NP Inpatient   06/22/2013  4:01 PM 06/23/2013  9:05 PM Full Code [id number]  Mariea Clonts, MD ED   06/13/2013 11:40 PM 06/14/2013  8:45 PM Full Code [id number]  Othella Boyer, MD Inpatient   05/13/2013 11:23 AM 05/14/2013  9:24 PM Full Code [id number]  Waynetta Pean ED   02/13/2012  4:03 PM 02/15/2012  6:29 PM Full Code [id number]  Shary Decamp, RN Inpatient   10/14/2011  1:06 PM 10/14/2011  1:06 PM DNR [id number]  Thera Flake, MD Inpatient        IV Access:    Peripheral IV   Procedures and diagnostic studies:   No results found.   Medical Consultants:    None.  Anti-Infectives:   Doxycycline and Unasyn.  Subjective:    [name] is post operative for R BKA , Pain is controlled , no N/V , no complaints or issues .  Objective:    Vitals:   04/10/17 1005 04/10/17 1015 04/10/17 1033 04/10/17 1318  BP:  104/76 109/75 (!) 154/69  Pulse: 82 85 74 90  Resp: 17 16 16 16   Temp:  (!) 97.3 F (36.3 C) (!) 97.5 F (36.4 C) 98.3 F (36.8 C)  TempSrc:   Oral Oral  SpO2: 100% 100% 100% 100%  Weight:      Height:        Intake/Output Summary (Last 24 hours) at 04/10/17 1435 Last data filed at 04/10/17 1318   Gross per 24 hour  Intake          1486.67 ml  Output              450 ml  Net  1036.67 ml   Filed Weights   04/09/17 0641 04/09/17 1714 04/10/17 0524  Weight: 40.4 kg (89 lb 1.1 oz) 40.7 kg (89 lb 11.6 oz) 40.2 kg (88 lb 10 oz)    Exam: General exam: NAD. Respiratory system: CTAB . Cardiovascular system: Regular rate and rhythm with positive S1-S2 no murmurs or gallops. Gastrointestinal system: Abdomen soft nontender nondistended Central nervous system: aao*3. Extremities: No lower extremity edema. Skin: He has a necrotic open wound. Foul smelling with exposed ligament vein and arteries and nerves.   Data Reviewed:    Labs: Basic Metabolic Panel:  Recent Labs Lab 04/08/17 0618 04/08/17 0954 04/09/17 0510 04/10/17 0254  NA 139 140 141 140  K 4.0 4.0 4.0 3.7  CL 107 107 112* 112*  CO2  --  22 21* 21*  GLUCOSE 96 103* 104* 101*  BUN 26* 26* 20 17  CREATININE 2.60* 2.25* 1.18* 0.91  CALCIUM  --  8.7* 8.6* 8.7*   GFR Estimated Creatinine Clearance: 45.4 mL/min (by C-G formula based on SCr of 0.91 mg/dL). Liver Function Tests: No results for input(s): AST, ALT, ALKPHOS, BILITOT, PROT, ALBUMIN in the last 168 hours. No results for input(s): LIPASE, AMYLASE in the last 168 hours. No results for input(s): AMMONIA in the last 168 hours. Coagulation profile No results for input(s): INR, PROTIME in the last 168 hours.  CBC:  Recent Labs Lab 04/08/17 0549 04/08/17 0618 04/09/17 0510 04/10/17 0254  WBC 10.1  --  7.3 6.3  NEUTROABS 6.7  --   --   --   HGB 8.9* 8.8* 8.9* 8.1*  HCT 26.9* 26.0* 28.0* 26.1*  MCV 78.7  --  78.4 80.6  PLT 513*  --  441* 450*   Cardiac Enzymes:  Recent Labs Lab 04/08/17 1006  CKTOTAL 77   BNP (last 3 results) No results for input(s): PROBNP in the last 8760 hours. CBG: No results for input(s): GLUCAP in the last 168 hours. D-Dimer: No results for input(s): DDIMER in the last 72 hours. Hgb A1c: No results for input(s):  HGBA1C in the last 72 hours. Lipid Profile: No results for input(s): CHOL, HDL, LDLCALC, TRIG, CHOLHDL, LDLDIRECT in the last 72 hours. Thyroid function studies: No results for input(s): TSH, T4TOTAL, T3FREE, THYROIDAB in the last 72 hours.  Invalid input(s): FREET3 Anemia work up: No results for input(s): VITAMINB12, FOLATE, FERRITIN, TIBC, IRON, RETICCTPCT in the last 72 hours. Sepsis Labs:  Recent Labs Lab 04/08/17 0549 04/08/17 0618 04/09/17 0510 04/10/17 0254  WBC 10.1  --  7.3 6.3  LATICACIDVEN  --  0.52  --   --    Microbiology Recent Results (from the past 240 hour(s))  MRSA PCR Screening     Status: None   Collection Time: 03/31/17  3:17 PM  Result Value Ref Range Status   MRSA by PCR NEGATIVE NEGATIVE Final    Comment:        The GeneXpert MRSA Assay (FDA approved for NASAL specimens only), is one component of a comprehensive MRSA colonization surveillance program. It is not intended to diagnose MRSA infection nor to guide or monitor treatment for MRSA infections.   Blood culture (routine x 2)     Status: None (Preliminary result)   Collection Time: 04/08/17  6:09 AM  Result Value Ref Range Status   Specimen Description BLOOD RIGHT ARM  Final   Special Requests IN PEDIATRIC BOTTLE Blood Culture adequate volume  Final   Culture   Final    NO  GROWTH 2 DAYS Performed at [facility], [place] [address] [place], [place] [address]    Report Status PENDING  Incomplete  Blood culture (routine x 2)     Status: None (Preliminary result)   Collection Time: 04/08/17  6:10 AM  Result Value Ref Range Status   Specimen Description BLOOD LEFT FOREARM  Final   Special Requests IN PEDIATRIC BOTTLE Blood Culture adequate volume  Final   Culture   Final    NO GROWTH 2 DAYS Performed at [facility], [place] [address] [place], [place] [address]    Report Status PENDING  Incomplete  Surgical pcr screen     Status: None   Collection Time: 04/09/17  8:57 PM    Result Value Ref Range Status   MRSA, PCR NEGATIVE NEGATIVE Final   Staphylococcus aureus NEGATIVE NEGATIVE Final    Comment:        The Xpert SA Assay (FDA approved for NASAL specimens in patients over 12 years of age), is one component of a comprehensive surveillance program.  Test performance has been validated by [facility] for patients greater than or equal to 23 year old. It is not intended to diagnose infection nor to guide or monitor treatment.      Medications:   . amLODipine  5 mg Oral Daily  . buPROPion  150 mg Oral Daily  . docusate sodium  100 mg Oral BID  . doxycycline  100 mg Oral Q12H  . feeding supplement  1 Container Oral TID BM  . ferrous sulfate  325 mg Oral BID WC  . gabapentin  100 mg Oral TID  . heparin  5,000 Units Subcutaneous Q8H  . mirtazapine  7.5 mg Oral QHS  . multivitamin with minerals  1 tablet Oral Daily  . silver sulfADIAZINE   Topical Daily   Continuous Infusions: . sodium chloride 10 mL/hr at 04/10/17 1349  . ampicillin-sulbactam (UNASYN) IV Stopped (04/10/17 0218)  . methocarbamol (ROBAXIN)  IV        LOS: 1 day   Waldron Session  [facility] Pager [phone]  *Please refer to [email or web address], password [email or web address] to get updated schedule on who will round on this patient, as hospitalists switch teams weekly. If 7PM-7AM, please contact night-coverage at [email or web address], password [email or web address] for any overnight needs.  04/10/2017, 2:35 PM

## 2017-04-10 NOTE — Op Note (Addendum)
   Date of Surgery: 04/10/2017  INDICATIONS: [name] is a 56 y.o.-year-old female who has had a several year history of chronic ulcers posterior aspect of both. Patient is undergone prolonged conservative therapy without resolution of the ulcers. She has necrotic skin soft tissue involving the posterior half of her calf there is exposed bone and necrotic Achilles tendon bilaterally.  Right calf ulcer 12 x 6 cm left calf ulcer 9 x 9 cm  PREOPERATIVE DIAGNOSIS: Gangrenous ulcers bilateral lower extremities  POSTOPERATIVE DIAGNOSIS: Same.  PROCEDURE: Transtibial amputation bilateral Application of Prevena wound VAC bilateral  SURGEON: Sharol Given, M.D.  ANESTHESIA:  Spinal plus bilateral popliteal blocks  IV FLUIDS AND URINE: See anesthesia.  ESTIMATED BLOOD LOSS: Minimal mL.  COMPLICATIONS: None.  DESCRIPTION OF PROCEDURE: The patient was brought to the operating room and underwent a spinal anesthetic after bilateral popliteal blocks. After adequate levels of anesthesia were obtained patient's lower extremity was prepped using DuraPrep draped into a sterile field. A timeout was called. The foot was draped out of the sterile field with impervious stockinette. This was performed bilaterally. A transverse incision was made 11 cm distal to the tibial tubercle. This curved proximally and a large posterior flap was created. The tibia was transected 1 cm proximal to the skin incision. The fibula was transected just proximal to the tibial incision. The tibia was beveled anteriorly. A large posterior flap was created. The sciatic nerve was pulled cut and allowed to retract. The vascular bundles were suture ligated with 2-0 silk. The deep and superficial fascial layers were closed using #1 Vicryl. The skin was closed using staples and 2-0 nylon. The wound was covered with a Prevena wound VAC. There was a good suction fit. This was performed bilaterally.. Patient wa taken to the PACU in stable  condition.  Cristina Score, MD [place] 8:47 AM

## 2017-04-10 NOTE — Progress Notes (Signed)
PT Cancellation Note  Patient Details Name: [name] MRN: [id number] DOB: 1961/03/31   Cancelled Treatment:    Reason Eval/Treat Not Completed: Patient at procedure or test/unavailable;Patient not medically ready.   Patient to OR today, now s/p bilateral BKA's.  Will initiate PT evaluation tomorrow.  Thank you for this referral.   Despina Pole 04/10/2017, 12:12 PM Carita Pian. Sanjuana Kava, [place] Pager [phone]

## 2017-04-10 NOTE — Transfer of Care (Signed)
Immediate Anesthesia Transfer of Care Note  Patient: [name]  Procedure(s) Performed: Procedure(s): AMPUTATION BELOW KNEE (Bilateral)  Patient Location: PACU  Anesthesia Type:MAC, Regional and Spinal  Level of Consciousness: awake, alert  and oriented  Airway & Oxygen Therapy: Patient Spontanous Breathing  Post-op Assessment: Report given to RN and Post -op Vital signs reviewed and stable  Post vital signs: Reviewed and stable  Last Vitals:  Vitals:   04/09/17 2226 04/10/17 0524  BP: (!) 149/77 (!) 182/88  Pulse: 92 91  Resp: 18 20  Temp: 37.1 C 36.9 C    Last Pain:  Vitals:   04/09/17 2246  TempSrc:   PainSc: 0-No pain         Complications: No apparent anesthesia complications

## 2017-04-10 NOTE — Anesthesia Procedure Notes (Signed)
Anesthesia Regional Block: Popliteal block   Pre-Anesthetic Checklist: ,, timeout performed, Correct Patient, Correct Site, Correct Laterality, Correct Procedure, Correct Position, site marked, Risks and benefits discussed,  Surgical consent,  Pre-op evaluation,  At surgeon's request and post-op pain management  Laterality: Right and Left  Prep: chloraprep       Needles:  Injection technique: Single-shot  Needle Type: Stimiplex     Needle Length: 10cm  Needle Gauge: 21     Additional Needles:   Procedures: ultrasound guided,,,,,,,,  Motor weakness within 5 minutes.   Nerve Stimulator or Paresthesia:  Response: 0.5 mA,   Additional Responses:   Narrative:  Start time: 04/10/2017 7:15 AM End time: 04/10/2017 7:25 AM Injection made incrementally with aspirations every 5 mL.  Performed by: Personally  Anesthesiologist: Nolon Nations  Additional Notes: Nerve located and needle positioned with direct ultrasound guidance. Good perineural spread. Patient tolerated well.

## 2017-04-10 NOTE — Progress Notes (Signed)
Pt awaiting transfer  Back to room as of 0950

## 2017-04-11 ENCOUNTER — Encounter (HOSPITAL_COMMUNITY): Payer: Self-pay | Admitting: Orthopedic Surgery

## 2017-04-11 DIAGNOSIS — L97903 Non-pressure chronic ulcer of unspecified part of unspecified lower leg with necrosis of muscle: Secondary | ICD-10-CM

## 2017-04-11 DIAGNOSIS — I70261 Atherosclerosis of native arteries of extremities with gangrene, right leg: Secondary | ICD-10-CM

## 2017-04-11 LAB — COMPREHENSIVE METABOLIC PANEL
ALBUMIN: 2.3 g/dL — AB (ref 3.5–5.0)
ALT: 10 U/L — ABNORMAL LOW (ref 14–54)
AST: 19 U/L (ref 15–41)
Alkaline Phosphatase: 52 U/L (ref 38–126)
Anion gap: 5 (ref 5–15)
BILIRUBIN TOTAL: 0.4 mg/dL (ref 0.3–1.2)
BUN: 14 mg/dL (ref 6–20)
CO2: 21 mmol/L — AB (ref 22–32)
Calcium: 8.7 mg/dL — ABNORMAL LOW (ref 8.9–10.3)
Chloride: 111 mmol/L (ref 101–111)
Creatinine, Ser: 0.87 mg/dL (ref 0.44–1.00)
GFR calc Af Amer: >60 mL/min (ref >60)
GFR calc non Af Amer: >60 mL/min (ref >60)
GLUCOSE: 143 mg/dL — AB (ref 65–99)
POTASSIUM: 4.1 mmol/L (ref 3.5–5.1)
SODIUM: 137 mmol/L (ref 135–145)
TOTAL PROTEIN: 5.9 g/dL — AB (ref 6.5–8.1)

## 2017-04-11 LAB — CBC
HEMATOCRIT: 25.7 % — AB (ref 36.0–46.0)
HEMOGLOBIN: 7.8 g/dL — AB (ref 12.0–15.0)
MCH: 25.1 pg — ABNORMAL LOW (ref 26.0–34.0)
MCHC: 30.4 g/dL (ref 30.0–36.0)
MCV: 82.6 fL (ref 78.0–100.0)
Platelets: 396 10*3/uL (ref 150–400)
RBC: 3.11 MIL/uL — ABNORMAL LOW (ref 3.87–5.11)
RDW: 17.1 % — AB (ref 11.5–15.5)
WBC: 8.7 10*3/uL (ref 4.0–10.5)

## 2017-04-11 NOTE — Plan of Care (Signed)
Problem: Activity: Goal: Will remain free from falls Outcome: Progressing Pt will be free from falls and injuries during this hospitalization.  Problem: Pain Management: Goal: Pain level will decrease with appropriate interventions Outcome: Progressing Pt will be at goal tolerable pain goal prior to discharge.

## 2017-04-11 NOTE — Evaluation (Signed)
Physical Therapy Evaluation Patient Details Name: [name] MRN: [id number] DOB: 10/17/1960 Today's Date: 04/11/2017   History of Present Illness  [name] is an 56 y.o. 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  Patient with gangrenous changes, and is now s/p Bilateral BKA's with VAC placement on 04/10/17.  Clinical Impression  Patient presents with problems listed below.  Will benefit from acute PT to maximize functional mobility prior to discharge.  Recommend Inpatient Rehab consult to return patient to highest level of independence prior to return home.      Follow Up Recommendations CIR    Equipment Recommendations  3in1 (PT) (Drop-arm 3in1 BSC)    Recommendations for Other Services Rehab consult     Precautions / Restrictions Precautions Precautions: Fall Restrictions Weight Bearing Restrictions: No Other Position/Activity Restrictions: Keep operative extremities elevated      Mobility  Bed Mobility Overal bed mobility: Needs Assistance Bed Mobility: Supine to Sit;Sit to Supine     Supine to sit: Supervision Sit to supine: Supervision   General bed mobility comments: Patient able to move to long sitting, but keeps stumps off of bed.  Good balance in sitting with UE support.  Supervision for safety only.  Transfers                 General transfer comment: Declined due to pain.  Ambulation/Gait                Stairs            Wheelchair Mobility    Modified Rankin (Stroke Patients Only)       Balance Overall balance assessment: Needs assistance Sitting-balance support: Single extremity supported Sitting balance-Leahy Scale: Fair                                       Pertinent Vitals/Pain Pain Assessment: 0-10 Pain Score: 10-Worst  pain ever Pain Location: Bil residual limbs Pain Descriptors / Indicators: Aching;Constant;Grimacing;Sharp;Sore Pain Intervention(s): Limited activity within patient's tolerance;Monitored during session;Repositioned;Patient requesting pain meds-RN notified    Home Living Family/patient expects to be discharged to:: Unsure Living Arrangements: Other (Comment) (Boarding house with some of her relatives present) Available Help at Discharge: Family;Friend(s);Available 24 hours/day ("They are around all of the time") Type of Home: House ([place]) Home Access: Stairs to enter Entrance Stairs-Rails: Left Entrance Stairs-Number of Steps: 3 Home Layout: Two level;Able to live on main level with bedroom/bathroom Home Equipment: Kasandra Knudsen - single point;Walker - 2 wheels;Wheelchair - Education officer, community - power;Shower seat (Battery is dead in Engineer, manufacturing systems)      Prior Function Level of Independence: Needs assistance   Gait / Transfers Assistance Needed: Used RW or WC for mobility. Reports her friends help her if she needs to get down the steps.   ADL's / Homemaking Assistance Needed: Reports she could bathe and dress independently.  They prepare their own food at house.        Hand Dominance   Dominant Hand: Right    Extremity/Trunk Assessment   Upper Extremity Assessment Upper Extremity Assessment: Overall WFL for tasks assessed    Lower Extremity Assessment Lower Extremity Assessment: RLE deficits/detail;LLE deficits/detail RLE Deficits / Details: New BKA, strength at least 3/5 RLE: Unable to fully assess due to  pain LLE Deficits / Details: New BKA, strength at least 3/5 LLE: Unable to fully assess due to pain    Cervical / Trunk Assessment Cervical / Trunk Assessment: Normal  Communication   Communication: No difficulties  Cognition Arousal/Alertness: Awake/alert Behavior During Therapy: WFL for tasks assessed/performed;Flat affect Overall Cognitive Status: Within  Functional Limits for tasks assessed                                        General Comments General comments (skin integrity, edema, etc.): VAC's in place on BLE's    Exercises Amputee Exercises Quad Sets: AROM;Both;5 reps;Supine Chair Push Up: AROM;Both;5 reps;Seated (Patient able to clear hips from bed)   Assessment/Plan    PT Assessment Patient needs continued PT services  PT Problem List Decreased activity tolerance;Decreased balance;Decreased mobility;Decreased knowledge of use of DME;Decreased knowledge of precautions;Pain;Decreased skin integrity       PT Treatment Interventions DME instruction;Functional mobility training;Therapeutic activities;Therapeutic exercise;Balance training;Patient/family education;Wheelchair mobility training    PT Goals (Current goals can be found in the Care Plan section)  Acute Rehab PT Goals Patient Stated Goal: To go home PT Goal Formulation: With patient Time For Goal Achievement: 04/25/17 Potential to Achieve Goals: Good    Frequency Min 3X/week   Barriers to discharge        Co-evaluation               AM-PAC PT "6 Clicks" Daily Activity  Outcome Measure Difficulty turning over in bed (including adjusting bedclothes, sheets and blankets)?: None Difficulty moving from lying on back to sitting on the side of the bed? : None Difficulty sitting down on and standing up from a chair with arms (e.g., wheelchair, bedside commode, etc,.)?: Total Help needed moving to and from a bed to chair (including a wheelchair)?: A Lot Help needed walking in hospital room?: Total Help needed climbing 3-5 steps with a railing? : Total 6 Click Score: 13    End of Session   Activity Tolerance: Patient limited by pain Patient left: in bed;with call bell/phone within reach;with bed alarm set (LE's elevated on pillows) Nurse Communication: Patient requests pain meds PT Visit Diagnosis: Pain;Other abnormalities of gait and  mobility (R26.89) Pain - Right/Left:  (Bilateral) Pain - part of body: Leg    Time: 4287-6811 PT Time Calculation (min) (ACUTE ONLY): 19 min   Charges:   PT Evaluation $PT Eval Moderate Complexity: 1 Procedure     PT G Codes:        Carita Pian. Sanjuana Kava, [facility] Acute Rehab Services Pager [place] 04/11/2017, 10:40 PM

## 2017-04-11 NOTE — Progress Notes (Signed)
Orders reviewed

## 2017-04-11 NOTE — Progress Notes (Signed)
[facility] PROGRESS NOTE    Progress Note  [name]  GLO:[id number] DOB: [birth year] DOA: 04/08/2017 PCP: Patient, No Pcp Per     Brief Narrative:   [name] is an 56 y.o. 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 Nicholes Stairs:  With gangrenous changes who failed outpatient treatment because of noncompliance and lack of follow-up with the wound care clinic with the frequent admissions in the past for the same reason who continues to do drugs. She was taken to the OR 04/10/17 for bilateral below the knee amputation continue the IV antibiotics for now and pain control and follow-up with the surgery and wound care.  Placement efforts will be initiated .   Chronic pain management: Avoid any IV drugs as possible .  Probably substance abuse/Cocaine use disorder, severe, dependence ([place])  With active cocaine use, psychiatry was consulted and recommended to start Wellbutrin. Social and consulted for substance abuse.   Elevated hypertension: Likely due to cocaine, agree with Norvasc will use clonidine when necessary.  Acute kidney injury: Acute prerenal in etiology, improving with IV fluid hydration continue normal saline.   DVT prophylaxis: lovenox Family Communication:none Disposition Plan/Barrier to D/C: unable to detemrine Code Status:     Code Status Orders        Start     Ordered   04/08/17 0834  Full code  Continuous     04/08/17 0834    Code Status History    Date Active Date Inactive Code Status Order ID Comments User Context   03/30/2017  6:51 PM 04/01/2017  8:24 PM Full Code [id number]  Verlee Monte, MD Inpatient   02/06/2017  6:05 AM 02/08/2017  8:31 PM Full Code [id number]  Riccardo Dubin, MD ED   12/22/2016  9:31 PM 12/28/2016 10:34 PM Full Code [id number]  Burgess Estelle, MD Inpatient   10/14/2016  3:25 PM 10/24/2016 10:53 PM Full Code [id number]  Jule Ser, DO Inpatient   09/21/2016  1:07 AM 09/22/2016  7:10 PM Full Code [id number]  Junius Creamer, NP ED   09/09/2016  5:22 PM 09/11/2016  5:01 PM Full Code [id number]  Juliet Rude, MD Inpatient   07/25/2016  5:25 AM 07/29/2016  9:20 PM Full Code [id number]  Juliet Rude, MD Inpatient   06/14/2016  3:13 PM 06/19/2016  7:31 PM Full Code [id number]  Florinda Marker, MD Inpatient   05/18/2016  7:59 PM 05/23/2016  5:17 PM Full Code [id number]  Juliet Rude, MD Inpatient   04/27/2016  9:11 AM 05/01/2016  7:16 PM Full Code [id number]  Florinda Marker, MD Inpatient   04/01/2016  5:27 PM 04/04/2016  4:51 PM Full Code [id number]  Jones Bales, MD Inpatient   11/14/2015 11:16 PM 11/15/2015  5:29 PM Full Code [id number]  Juluis Mire, MD ED   10/23/2015  4:15 PM 10/29/2015  4:36 PM Full Code [id number]  Juliet Rude, MD Inpatient   09/25/2015  5:36 AM 10/02/2015  7:11 PM Full Code [id number]  Riccardo Dubin, MD ED   06/15/2015  1:47 AM 06/17/2015  8:39 PM Full Code [id number]  Vickii Chafe, MD Inpatient   06/04/2015  8:45 PM 06/05/2015  9:33 PM Full Code [id number]  Corky Sox, MD Inpatient  04/21/2015  2:05 PM 04/22/2015 10:25 PM Full Code [id number]  Corky Sox, MD Inpatient   04/08/2015  1:01 AM 04/08/2015  8:15 PM Full Code [id number]  Dellia Nims, MD Inpatient   03/09/2015  9:35 PM 03/12/2015 12:31 AM Full Code [id number]  Karlene Einstein, MD Inpatient   01/14/2015  5:34 PM 01/17/2015  1:10 AM Full Code [id number]  Juliet Rude, MD Inpatient   10/09/2014 10:45 PM 10/17/2014  5:38 PM Full Code [id number]  Laverle Hobby, PA-C Inpatient   10/09/2014  3:00 AM 10/09/2014 10:45 PM Full Code [id number]  Everlene Balls, MD ED   05/07/2014  5:09 PM 05/08/2014  7:23 PM Full Code [id number]  Benjamine Mola, FNP Inpatient   05/07/2014  8:35 AM 05/07/2014  5:09 PM Full Code [id number]   Ernestina Patches, MD ED   05/07/2014  1:08 AM 05/07/2014  8:35 AM Full Code [id number]  Carmin Muskrat, MD ED   04/19/2014  5:26 PM 04/22/2014  2:22 PM Full Code [id number]  Clinton Gallant, MD Inpatient   03/30/2014  3:27 AM 03/31/2014  8:08 PM Full Code [id number]  Cresenciano Genre, MD Inpatient   01/13/2014  6:23 PM 01/26/2014  4:51 PM Full Code [id number]  Waylan Boga, NP Inpatient   01/13/2014  4:57 AM 01/13/2014  6:23 PM Full Code [id number]  Garald Balding, NP ED   10/11/2013  4:50 AM 10/17/2013  8:05 PM Full Code [id number]  Blain Pais, MD ED   09/24/2013  6:52 PM 10/02/2013  6:07 PM Full Code [id number]  Blain Pais, MD Inpatient   08/30/2013  4:06 AM 08/30/2013 11:40 PM Full Code [id number]  Martie Lee, PA-C ED   06/23/2013  9:05 PM 06/28/2013  5:56 PM Full Code [id number]  Delfin Gant, NP Inpatient   06/22/2013  4:01 PM 06/23/2013  9:05 PM Full Code [id number]  Mariea Clonts, MD ED   06/13/2013 11:40 PM 06/14/2013  8:45 PM Full Code [id number]  Othella Boyer, MD Inpatient   05/13/2013 11:23 AM 05/14/2013  9:24 PM Full Code [id number]  Waynetta Pean ED   02/13/2012  4:03 PM 02/15/2012  6:29 PM Full Code [id number]  Shary Decamp, RN Inpatient   10/14/2011  1:06 PM 10/14/2011  1:06 PM DNR [id number]  Thera Flake, MD Inpatient        IV Access:    Peripheral IV   Procedures and diagnostic studies:   No results found.   Medical Consultants:    None.  Anti-Infectives:   Doxycycline and Unasyn.  Subjective:    [name] is post operative for BKA ,She is smily and happy without any complaints , her attitude is very good today with a good energy level, is denying any pain or nausea or vomiting or fevers or chills.  Objective:    Vitals:   04/10/17 1318 04/10/17 2100 04/11/17 0500 04/11/17 0611  BP: (!) 154/69 (!) 157/89  (!) 159/85  Pulse: 90 (!) 106  (!) 107  Resp: 16 20  16   Temp: 98.3 F (36.8 C) 99.1 F (37.3 C)  99.4 F (37.4 C)  TempSrc: Oral  Oral  Oral  SpO2: 100% 97%  99%  Weight:   46.9 kg (103 lb 6.4 oz)   Height:        Intake/Output Summary (Last 24 hours) at 04/11/17 1358 Last data filed at 04/11/17 1343  Gross per 24 hour  Intake  2603.42 ml  Output             1300 ml  Net          1303.42 ml   Filed Weights   04/09/17 1714 04/10/17 0524 04/11/17 0500  Weight: 40.7 kg (89 lb 11.6 oz) 40.2 kg (88 lb 10 oz) 46.9 kg (103 lb 6.4 oz)    Exam: General exam: non acute distress. Respiratory system: CTAB . Cardiovascular system:RRR,S1S2. Gastrointestinal system: Abdomen soft nontender nondistended Central nervous system: aao*3. Extremities: No lower extremity edema. Skin: He has a necrotic open wound. Foul smelling with exposed ligament vein and arteries and nerves.   Data Reviewed:    Labs: Basic Metabolic Panel:  Recent Labs Lab 04/08/17 0618 04/08/17 0954 04/09/17 0510 04/10/17 0254 04/11/17 0538  NA 139 140 141 140 137  K 4.0 4.0 4.0 3.7 4.1  CL 107 107 112* 112* 111  CO2  --  22 21* 21* 21*  GLUCOSE 96 103* 104* 101* 143*  BUN 26* 26* 20 17 14   CREATININE 2.60* 2.25* 1.18* 0.91 0.87  CALCIUM  --  8.7* 8.6* 8.7* 8.7*   GFR Estimated Creatinine Clearance: 55.4 mL/min (by C-G formula based on SCr of 0.87 mg/dL). Liver Function Tests:  Recent Labs Lab 04/11/17 0538  AST 19  ALT 10*  ALKPHOS 52  BILITOT 0.4  PROT 5.9*  ALBUMIN 2.3*   No results for input(s): LIPASE, AMYLASE in the last 168 hours. No results for input(s): AMMONIA in the last 168 hours. Coagulation profile No results for input(s): INR, PROTIME in the last 168 hours.  CBC:  Recent Labs Lab 04/08/17 0549 04/08/17 0618 04/09/17 0510 04/10/17 0254 04/11/17 0538  WBC 10.1  --  7.3 6.3 8.7  NEUTROABS 6.7  --   --   --   --   HGB 8.9* 8.8* 8.9* 8.1* 7.8*  HCT 26.9* 26.0* 28.0* 26.1* 25.7*  MCV 78.7  --  78.4 80.6 82.6  PLT 513*  --  441* 450* 396   Cardiac Enzymes:  Recent Labs Lab 04/08/17 1006    CKTOTAL 77   BNP (last 3 results) No results for input(s): PROBNP in the last 8760 hours. CBG: No results for input(s): GLUCAP in the last 168 hours. D-Dimer: No results for input(s): DDIMER in the last 72 hours. Hgb A1c: No results for input(s): HGBA1C in the last 72 hours. Lipid Profile: No results for input(s): CHOL, HDL, LDLCALC, TRIG, CHOLHDL, LDLDIRECT in the last 72 hours. Thyroid function studies: No results for input(s): TSH, T4TOTAL, T3FREE, THYROIDAB in the last 72 hours.  Invalid input(s): FREET3 Anemia work up: No results for input(s): VITAMINB12, FOLATE, FERRITIN, TIBC, IRON, RETICCTPCT in the last 72 hours. Sepsis Labs:  Recent Labs Lab 04/08/17 0549 04/08/17 0618 04/09/17 0510 04/10/17 0254 04/11/17 0538  WBC 10.1  --  7.3 6.3 8.7  LATICACIDVEN  --  0.52  --   --   --    Microbiology Recent Results (from the past 240 hour(s))  Blood culture (routine x 2)     Status: None (Preliminary result)   Collection Time: 04/08/17  6:09 AM  Result Value Ref Range Status   Specimen Description BLOOD RIGHT ARM  Final   Special Requests IN PEDIATRIC BOTTLE Blood Culture adequate volume  Final   Culture   Final    NO GROWTH 3 DAYS Performed at [facility], [place] [address] [place], [place] [address]    Report Status PENDING  Incomplete  Blood culture (routine x 2)     Status: None (Preliminary result)   Collection Time: 04/08/17  6:10 AM  Result Value Ref Range Status   Specimen Description BLOOD LEFT FOREARM  Final   Special Requests IN PEDIATRIC BOTTLE Blood Culture adequate volume  Final   Culture   Final    NO GROWTH 3 DAYS Performed at [facility], [place] [address] [place], [place] [address]    Report Status PENDING  Incomplete  Surgical pcr screen     Status: None   Collection Time: 04/09/17  8:57 PM  Result Value Ref Range Status   MRSA, PCR NEGATIVE NEGATIVE Final   Staphylococcus aureus NEGATIVE NEGATIVE Final    Comment:        The  Xpert SA Assay (FDA approved for NASAL specimens in patients over 48 years of age), is one component of a comprehensive surveillance program.  Test performance has been validated by [facility] for patients greater than or equal to 18 year old. It is not intended to diagnose infection nor to guide or monitor treatment.      Medications:   . amLODipine  5 mg Oral Daily  . buPROPion  150 mg Oral Daily  . docusate sodium  100 mg Oral BID  . doxycycline  100 mg Oral Q12H  . feeding supplement  1 Container Oral TID BM  . ferrous sulfate  325 mg Oral BID WC  . gabapentin  100 mg Oral TID  . heparin  5,000 Units Subcutaneous Q8H  . mirtazapine  7.5 mg Oral QHS  . multivitamin with minerals  1 tablet Oral Daily  . silver sulfADIAZINE   Topical Daily   Continuous Infusions: . sodium chloride 75 mL/hr at 04/11/17 1326  . ampicillin-sulbactam (UNASYN) IV Stopped (04/11/17 1056)  . methocarbamol (ROBAXIN)  IV        LOS: 2 days   Waldron Session  [facility] Pager [phone]  *Please refer to [email or web address], password [email or web address] to get updated schedule on who will round on this patient, as hospitalists switch teams weekly. If 7PM-7AM, please contact night-coverage at [email or web address], password [email or web address] for any overnight needs.  04/11/2017, 1:58 PM

## 2017-04-11 NOTE — Progress Notes (Signed)
[name], female   DOB: December 02, 1960, 56 y.o.   MRN: [id number] Postoperative day 1 status post bilateral transtibial amputations. Patient states she has a little bit of soreness but not the chronic pain she's had before. There is no drainage from the wound vacs. She was given instructions to work on knee extension exercises daily. Anticipate discharge to skilled nursing at this time.

## 2017-04-12 LAB — HEMOGLOBIN AND HEMATOCRIT, BLOOD
HEMATOCRIT: 28.5 % — AB (ref 36.0–46.0)
HEMOGLOBIN: 9.1 g/dL — AB (ref 12.0–15.0)

## 2017-04-12 LAB — COMPREHENSIVE METABOLIC PANEL
ALBUMIN: 2.1 g/dL — AB (ref 3.5–5.0)
ALK PHOS: 48 U/L (ref 38–126)
ALT: 10 U/L — ABNORMAL LOW (ref 14–54)
ANION GAP: 5 (ref 5–15)
AST: 19 U/L (ref 15–41)
BILIRUBIN TOTAL: 0.5 mg/dL (ref 0.3–1.2)
BUN: 13 mg/dL (ref 6–20)
CALCIUM: 8.3 mg/dL — AB (ref 8.9–10.3)
CO2: 21 mmol/L — ABNORMAL LOW (ref 22–32)
Chloride: 110 mmol/L (ref 101–111)
Creatinine, Ser: 0.74 mg/dL (ref 0.44–1.00)
Glucose, Bld: 102 mg/dL — ABNORMAL HIGH (ref 65–99)
POTASSIUM: 4 mmol/L (ref 3.5–5.1)
Sodium: 136 mmol/L (ref 135–145)
TOTAL PROTEIN: 5.3 g/dL — AB (ref 6.5–8.1)

## 2017-04-12 LAB — CBC
HEMATOCRIT: 21.8 % — AB (ref 36.0–46.0)
HEMOGLOBIN: 6.8 g/dL — AB (ref 12.0–15.0)
MCH: 25.3 pg — AB (ref 26.0–34.0)
MCHC: 31.2 g/dL (ref 30.0–36.0)
MCV: 81 fL (ref 78.0–100.0)
Platelets: 330 10*3/uL (ref 150–400)
RBC: 2.69 MIL/uL — ABNORMAL LOW (ref 3.87–5.11)
RDW: 16.4 % — AB (ref 11.5–15.5)
WBC: 6.5 10*3/uL (ref 4.0–10.5)

## 2017-04-12 LAB — ABO/RH: ABO/RH(D): A POS

## 2017-04-12 LAB — PREPARE RBC (CROSSMATCH)

## 2017-04-12 MED ORDER — SODIUM CHLORIDE 0.9 % IV SOLN
Freq: Once | INTRAVENOUS | Status: AC
  Administered 2017-04-12: 16:00:00 via INTRAVENOUS

## 2017-04-12 NOTE — Progress Notes (Signed)
CRITICAL VALUE ALERT  Critical Value:  haemoglobin  Date & Time Notied:  04/12/2017 at 0638  Provider Notified: DR opyd  Orders Received/Actions taken: order receive for blood transfusion.

## 2017-04-12 NOTE — NC FL2 (Signed)
[place] LEVEL OF CARE SCREENING TOOL     IDENTIFICATION  Patient Name: [name] Birthdate: 1960-10-21 Sex: female Admission Date (Current Location): 04/08/2017  [facility] and [place] Number:  Herbalist and Address:  [facility], [place] [address], [place],  [address]      Provider Number: [id number]  Attending Physician Name and Address:  Waldron Session, MD  Relative Name and Phone Number:       Current Level of Care: Hospital Recommended Level of Care: [place] Prior Approval Number:    Date Approved/Denied:   PASRR Number: [id number]  Discharge Plan: SNF    Current Diagnoses: Patient Active Problem List   Diagnosis Date Noted  . Atherosclerosis of native arteries of extremities with gangrene, left leg ([place])   . Atherosclerosis of native arteries of extremities with gangrene, right leg ([place])   . Wound infection 03/30/2017  . AKI (acute kidney injury) ([place]) 02/07/2017  . Acute kidney injury ([place]) 02/06/2017  . Wound healing, delayed   . Osteomyelitis ([place])   . Ulcer with gangrene, with necrosis of muscle ([place])   . Skin ulcer of lower leg with necrosis of muscle, unspecified laterality ([place]) 06/04/2015  . Protein-calorie malnutrition, severe ([place]) 01/15/2015  . MDD (major depressive disorder), recurrent episode, severe ([place]) 10/16/2014  . Cocaine use disorder, severe, dependence ([place]) 10/10/2014  . 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 Essential hypertension 02/26/2010    Orientation RESPIRATION BLADDER Height & Weight     Self, Time, Situation, Place  Normal Incontinent, External catheter Weight: 47.2 kg (104 lb 0.9 oz) Height:  5' 1"  (154.9 cm)  BEHAVIORAL SYMPTOMS/MOOD NEUROLOGICAL BOWEL NUTRITION STATUS      Continent Diet (Please see DC Summary)  AMBULATORY STATUS COMMUNICATION OF NEEDS Skin   Extensive Assist Verbally Surgical wounds (Closed incision on leg; wound vac)                        Personal Care Assistance Level of Assistance  Bathing, Feeding, Dressing Bathing Assistance: Maximum assistance Feeding assistance: Independent Dressing Assistance: Limited assistance     Functional Limitations Info             SPECIAL CARE FACTORS FREQUENCY  PT (By licensed PT)     PT Frequency: 5x/week              Contractures      Additional Factors Info  Code Status, Allergies, Isolation Precautions, Psychotropic Code Status Info: Full Allergies Info: Acetaminophen, Other Psychotropic Info: Wellbutrin   Isolation Precautions Info: MRSA     Current Medications (04/12/2017):  This is the current hospital active medication list Current Facility-Administered Medications  Medication Dose Route Frequency Provider Last Rate Last Dose  . 0.9 %  sodium chloride infusion   Intravenous Continuous Waldron Session, MD 75 mL/hr at 04/12/17 0232    . 0.9 %  sodium chloride infusion   Intravenous Once Opyd, Ilene Qua, MD      . acetaminophen (TYLENOL) tablet 650 mg  650 mg Oral Q6H PRN Lavina Hamman, MD       Or  . acetaminophen (TYLENOL) suppository 650 mg  650 mg Rectal Q6H PRN Lavina Hamman, MD      . amLODipine (NORVASC) tablet 5 mg  5 mg Oral Daily Lavina Hamman, MD   5 mg at 04/11/17 0901  . ampicillin-sulbactam (UNASYN) 1.5 g in sodium chloride 0.9 %  50 mL IVPB  1.5 g Intravenous Q8H Charlynne Cousins, MD   Stopped at 04/12/17 0133  . bisacodyl (DULCOLAX) suppository 10 mg  10 mg Rectal Daily PRN Newt Minion, MD      . buPROPion (WELLBUTRIN XL) 24 hr tablet 150 mg  150 mg Oral Daily Lavina Hamman, MD   150 mg at 04/11/17 0901  . docusate sodium (COLACE) capsule 100 mg  100 mg Oral BID Newt Minion, MD   100 mg at 04/11/17 2122  . doxycycline (VIBRA-TABS) tablet 100 mg  100 mg Oral Q12H Charlynne Cousins, MD   100 mg at 04/11/17 2122  . feeding supplement (BOOST / RESOURCE BREEZE) liquid 1 Container  1 Container Oral TID BM Charlynne Cousins, MD   1 Container at 04/11/17 2016  . ferrous sulfate tablet 325 mg  325 mg Oral BID WC Lavina Hamman, MD   325 mg at 04/11/17 1649  . gabapentin (NEURONTIN) capsule 100 mg  100 mg Oral TID Lavina Hamman, MD   100 mg at 04/11/17 2122  . heparin injection 5,000 Units  5,000 Units Subcutaneous Q8H Lavina Hamman, MD   5,000 Units at 04/12/17 0529  . hydrALAZINE (APRESOLINE) injection 10 mg  10 mg Intravenous Q4H PRN Lavina Hamman, MD   10 mg at 04/10/17 9163  . HYDROcodone-acetaminophen (NORCO/VICODIN) 5-325 MG per tablet 1-2 tablet  1-2 tablet Oral Q4H PRN Lavina Hamman, MD   2 tablet at 04/09/17 1244  . HYDROmorphone (DILAUDID) injection 1 mg  1 mg Intravenous Q2H PRN Newt Minion, MD   1 mg at 04/12/17 8466  . hydrOXYzine (ATARAX/VISTARIL) tablet 25 mg  25 mg Oral TID PRN Lavina Hamman, MD      . magnesium citrate solution 1 Bottle  1 Bottle Oral Once PRN Newt Minion, MD      . methocarbamol (ROBAXIN) tablet 500 mg  500 mg Oral Q6H PRN Newt Minion, MD       Or  . methocarbamol (ROBAXIN) 500 mg in dextrose 5 % 50 mL IVPB  500 mg Intravenous Q6H PRN Newt Minion, MD      . metoCLOPramide (REGLAN) tablet 5-10 mg  5-10 mg Oral Q8H PRN Newt Minion, MD       Or  . metoCLOPramide (REGLAN) injection 5-10 mg  5-10 mg Intravenous Q8H PRN Newt Minion, MD      . mirtazapine (REMERON) tablet 7.5 mg  7.5 mg Oral QHS Ambrose Finland, MD   7.5 mg at 04/11/17 2121  . multivitamin with minerals tablet 1 tablet  1 tablet Oral Daily Charlynne Cousins, MD   1 tablet at 04/11/17 0901  . ondansetron (ZOFRAN) tablet 4 mg  4 mg Oral Q6H PRN Newt Minion, MD       Or  . ondansetron [facility]) injection 4 mg  4 mg Intravenous Q6H PRN Newt Minion, MD      . ondansetron [facility]) tablet 4 mg  4 mg Oral Q6H PRN Lavina Hamman, MD      . oxyCODONE (Oxy IR/ROXICODONE) immediate release tablet 5-10 mg  5-10 mg Oral Q3H PRN Newt Minion, MD   10 mg at 04/11/17 2122  .  polyethylene glycol (MIRALAX / GLYCOLAX) packet 17 g  17 g Oral Daily PRN Newt Minion, MD   17 g at 04/11/17 1741  . silver sulfADIAZINE (SILVADENE) 1 % cream  Topical Daily Lavina Hamman, MD   1 application at 73/53/29 [phone]     Discharge Medications: Please see discharge summary for a list of discharge medications.  Relevant Imaging Results:  Relevant Lab Results:   Additional Information [id number]  Benard Halsted, LCSWA

## 2017-04-12 NOTE — Progress Notes (Signed)
   04/12/17 1005  Clinical Encounter Type  Visited With Patient  Visit Type Other (Comment) ([place] consult)  Spiritual Encounters  Spiritual Needs Brochure  Stress Factors  Patient Stress Factors Health changes  Introduction to Pt. Provided and explained AD. Notarized. Original to Pt and copy to chart.

## 2017-04-12 NOTE — Progress Notes (Signed)
Rehab Admissions Coordinator Note:  Patient was screened by Cleatrice Burke for appropriateness for an Inpatient Acute Rehab Consult per PT recommendation. PT eval limited yesterday. Would like to see more with therapy and then request an inpt rehab consult.  Cleatrice Burke 04/12/2017, 8:27 AM  I can be reached at [phone].

## 2017-04-12 NOTE — Progress Notes (Signed)
Physical Therapy Treatment Patient Details Name: [name] MRN: [id number] DOB: 1961/05/01 Today's Date: 04/12/2017    History of Present Illness [name] is an 56 y.o. 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  Patient with gangrenous changes, and is now s/p Bilateral BKA's with VAC placement on 04/10/17.    PT Comments    Pt performed posterior scoot transfer into chair from bed with min A. Pt with good control of limbs during transfer. Having increased pain residual limbs and keeping bilateral hips and knees flexed. Discussed proper positioning to prepare for prostheses as well as reviewing appropriate exercises. Pt currently limited by pain which is affecting positioning. PT will continue to follow.    Follow Up Recommendations  CIR     Equipment Recommendations  3in1 (PT)    Recommendations for Other Services Rehab consult     Precautions / Restrictions Precautions Precautions: Fall Restrictions Weight Bearing Restrictions: No Other Position/Activity Restrictions: Keep operative extremities elevated. wound vacs B LE's    Mobility  Bed Mobility Overal bed mobility: Needs Assistance Bed Mobility: Supine to Sit     Supine to sit: Supervision     General bed mobility comments: pt able to prop self onto elbows and then to hands and scoot to EOB  Transfers Overall transfer level: Needs assistance   Transfers: Comptroller transfers: Min assist   General transfer comment: posterior transfer into recliner with min A. Pt very strong UE's and once able to get arms of chair could scoot with supervision  Ambulation/Gait                 Stairs            Wheelchair Mobility    Modified Rankin (Stroke Patients Only)        Balance Overall balance assessment: Needs assistance Sitting-balance support: Single extremity supported Sitting balance-Leahy Scale: Fair Sitting balance - Comments: getting used to decreased wt of LE's, occasionally loses balance with self correction Postural control: Posterior lean                                  Cognition Arousal/Alertness: Awake/alert Behavior During Therapy: WFL for tasks assessed/performed;Flat affect Overall Cognitive Status: Within Functional Limits for tasks assessed                                        Exercises Amputee Exercises Quad Sets: AROM;Both;10 reps;Seated Hip ABduction/ADduction: AROM;Both;5 reps;Seated Hip Flexion/Marching: AROM;Both;10 reps;Seated Knee Extension: AROM;Both;10 reps;Seated Chair Push Up: AROM;Seated;5 reps    General Comments General comments (skin integrity, edema, etc.): hand out of amputee exercises given and discussed proper positioning to prepare for prostheses      Pertinent Vitals/Pain Pain Assessment: Faces Faces Pain Scale: Hurts whole lot Pain Location: Bil residual limbs Pain Descriptors / Indicators: Aching;Constant;Grimacing;Sharp;Sore Pain Intervention(s): Limited activity within patient's tolerance;Monitored during session;Repositioned    Home Living                      Prior Function            PT Goals (current goals can now  be found in the care plan section) Acute Rehab PT Goals Patient Stated Goal: To go home PT Goal Formulation: With patient Time For Goal Achievement: 04/25/17 Potential to Achieve Goals: Good Progress towards PT goals: Progressing toward goals    Frequency    Min 3X/week      PT Plan Current plan remains appropriate    Co-evaluation              AM-PAC PT "6 Clicks" Daily Activity  Outcome Measure  Difficulty turning over in bed (including adjusting bedclothes, sheets and blankets)?: None Difficulty moving from  lying on back to sitting on the side of the bed? : None Difficulty sitting down on and standing up from a chair with arms (e.g., wheelchair, bedside commode, etc,.)?: Total Help needed moving to and from a bed to chair (including a wheelchair)?: A Little Help needed walking in hospital room?: Total Help needed climbing 3-5 steps with a railing? : Total 6 Click Score: 14    End of Session   Activity Tolerance: Patient tolerated treatment well Patient left: in chair;with call bell/phone within reach Nurse Communication: Mobility status PT Visit Diagnosis: Pain;Other abnormalities of gait and mobility (R26.89) Pain - Right/Left:  (B) Pain - part of body: Leg     Time: 0626-9485 PT Time Calculation (min) (ACUTE ONLY): 20 min  Charges:  $Therapeutic Activity: 8-22 mins                    G Codes:       [place]  [place] 04/12/2017, 1:42 PM

## 2017-04-12 NOTE — Progress Notes (Addendum)
Chart reviewed.    [name] is s/p surgery.  I had discussed case with Dr. Posey Pronto on admission, and palliative was consulted prior to surgery to help decision making if she was not deemed surgical candidate.  As clear plan for rehab moving forward, palliative to sign off at this time.    Please let us know if there are specific areas with which we can be of assistance in the care of [name].  Micheline Rough, MD [place] Palliative Medicine Team [phone]  NO CHARGE NOTE

## 2017-04-12 NOTE — Progress Notes (Addendum)
[facility] PROGRESS NOTE    Progress Note  KHRISTY KALAN  AJO:[id number] DOB: [birth year] DOA: 04/08/2017 PCP: Patient, No Pcp Per     Brief Narrative:   [name] is an 56 y.o. 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 Nicholes Stairs:  With gangrenous changes who failed outpatient treatment because of noncompliance and lack of follow-up with the wound care clinic with the frequent admissions in the past for the same reason who continues to do drugs. She was taken to the OR 04/10/17 for bilateral below the knee amputation , stop the IV antibiotics ,  Continue pain control and follow-up with the surgery and wound care.  Pending Placement efforts.  Chronic pain management: Avoid any IV drugs as possible .  Probably substance abuse/Cocaine use disorder, severe, dependence ([place])  With active cocaine use, psychiatry was consulted and recommended to start Wellbutrin. Social and consulted for substance abuse.   Elevated hypertension: Likely due to cocaine, agree with Norvasc will use clonidine when necessary.  Acute kidney injury: Acute prerenal in etiology, improving with IV fluid hydration continue normal saline.   DVT prophylaxis: lovenox Family Communication:none Disposition Plan/Barrier to D/C: unable to detemrine Code Status:     Code Status Orders        Start     Ordered   04/08/17 0834  Full code  Continuous     04/08/17 0834    Code Status History    Date Active Date Inactive Code Status Order ID Comments User Context   03/30/2017  6:51 PM 04/01/2017  8:24 PM Full Code [id number]  Verlee Monte, MD Inpatient   02/06/2017  6:05 AM 02/08/2017  8:31 PM Full Code [id number]  Riccardo Dubin, MD ED   12/22/2016  9:31  PM 12/28/2016 10:34 PM Full Code [id number]  Burgess Estelle, MD Inpatient   10/14/2016  3:25 PM 10/24/2016 10:53 PM Full Code [id number]  Jule Ser, DO Inpatient   09/21/2016  1:07 AM 09/22/2016  7:10 PM Full Code [id number]  Junius Creamer, NP ED   09/09/2016  5:22 PM 09/11/2016  5:01 PM Full Code [id number]  Juliet Rude, MD Inpatient   07/25/2016  5:25 AM 07/29/2016  9:20 PM Full Code [id number]  Juliet Rude, MD Inpatient   06/14/2016  3:13 PM 06/19/2016  7:31 PM Full Code [id number]  Florinda Marker, MD Inpatient   05/18/2016  7:59 PM 05/23/2016  5:17 PM Full Code [id number]  Juliet Rude, MD Inpatient   04/27/2016  9:11 AM 05/01/2016  7:16 PM Full Code [id number]  Florinda Marker, MD Inpatient   04/01/2016  5:27 PM 04/04/2016  4:51 PM Full Code [id number]  Jones Bales, MD Inpatient   11/14/2015 11:16 PM 11/15/2015  5:29 PM Full Code [id number]  Juluis Mire, MD ED   10/23/2015  4:15 PM 10/29/2015  4:36 PM Full Code [id number]  Juliet Rude, MD Inpatient   09/25/2015  5:36 AM 10/02/2015  7:11 PM Full Code [id number]  Riccardo Dubin, MD ED   06/15/2015  1:47 AM 06/17/2015  8:39 PM Full Code [id number]  Vickii Chafe, MD Inpatient   06/04/2015  8:45 PM 06/05/2015  9:33 PM Full Code [id number]  Corky Sox, MD Inpatient   04/21/2015  2:05 PM 04/22/2015 10:25 PM Full Code [id number]  Corky Sox, MD Inpatient   04/08/2015  1:01 AM 04/08/2015  8:15 PM Full Code [id number]  Dellia Nims, MD Inpatient   03/09/2015  9:35 PM 03/12/2015 12:31 AM Full Code [id number]  Karlene Einstein, MD Inpatient   01/14/2015  5:34 PM 01/17/2015  1:10 AM Full Code [id number]  Juliet Rude, MD Inpatient   10/09/2014 10:45 PM 10/17/2014  5:38 PM Full Code [id number]  Laverle Hobby, PA-C Inpatient   10/09/2014  3:00 AM 10/09/2014 10:45 PM Full Code [id number]  Everlene Balls, MD ED   05/07/2014  5:09 PM 05/08/2014  7:23 PM Full Code [id number]  Benjamine Mola, FNP Inpatient   05/07/2014  8:35 AM 05/07/2014  5:09 PM Full Code [id number]  Ernestina Patches,  MD ED   05/07/2014  1:08 AM 05/07/2014  8:35 AM Full Code [id number]  Carmin Muskrat, MD ED   04/19/2014  5:26 PM 04/22/2014  2:22 PM Full Code [id number]  Clinton Gallant, MD Inpatient   03/30/2014  3:27 AM 03/31/2014  8:08 PM Full Code [id number]  Cresenciano Genre, MD Inpatient   01/13/2014  6:23 PM 01/26/2014  4:51 PM Full Code [id number]  Waylan Boga, NP Inpatient   01/13/2014  4:57 AM 01/13/2014  6:23 PM Full Code [id number]  Garald Balding, NP ED   10/11/2013  4:50 AM 10/17/2013  8:05 PM Full Code [id number]  Blain Pais, MD ED   09/24/2013  6:52 PM 10/02/2013  6:07 PM Full Code [id number]  Blain Pais, MD Inpatient   08/30/2013  4:06 AM 08/30/2013 11:40 PM Full Code [id number]  Martie Lee, PA-C ED   06/23/2013  9:05 PM 06/28/2013  5:56 PM Full Code [id number]  Delfin Gant, NP Inpatient   06/22/2013  4:01 PM 06/23/2013  9:05 PM Full Code [id number]  Mariea Clonts, MD ED   06/13/2013 11:40 PM 06/14/2013  8:45 PM Full Code [id number]  Othella Boyer, MD Inpatient   05/13/2013 11:23 AM 05/14/2013  9:24 PM Full Code [id number]  Waynetta Pean ED   02/13/2012  4:03 PM 02/15/2012  6:29 PM Full Code [id number]  Shary Decamp, RN Inpatient   10/14/2011  1:06 PM 10/14/2011  1:06 PM DNR [id number]  Thera Flake, MD Inpatient        IV Access:    Peripheral IV   Procedures and diagnostic studies:   No results found.   Medical Consultants:    None.  Anti-Infectives:   Doxycycline and Unasyn.  Subjective:    [name] is post operative for BKA ,she complains of B/L stump pain , no fevers or chills , no  N/V/D.  Objective:    Vitals:   04/12/17 0242 04/12/17 0624 04/12/17 1403 04/12/17 1530  BP:  (!) 144/87 (!) 158/80 (!) 158/85  Pulse:  99 97 97  Resp:  17 18 18   Temp:  98.5 F (36.9 C) 98.4 F (36.9 C) 99 F (37.2 C)  TempSrc:  Oral  Oral  SpO2:  95% 98% 98%  Weight: 47.2 kg (104 lb 0.9 oz)     Height:        Intake/Output Summary (Last 24 hours) at 04/12/17  1557 Last data filed at 04/12/17 1535  Gross per 24 hour  Intake          2998.75 ml  Output              300  ml  Net          2698.75 ml   Filed Weights   04/10/17 0524 04/11/17 0500 04/12/17 0242  Weight: 40.2 kg (88 lb 10 oz) 46.9 kg (103 lb 6.4 oz) 47.2 kg (104 lb 0.9 oz)    Exam: General exam: NAD. Respiratory system: CTAB . Cardiovascular system:RRR,S1S2. Gastrointestinal system: Abdomen soft nontender nondistended Central nervous system: aao*3. Extremities: No lower extremity edema , Wound vac in place , no signs of any residual infection .    Data Reviewed:    Labs: Basic Metabolic Panel:  Recent Labs Lab 04/08/17 0954 04/09/17 0510 04/10/17 0254 04/11/17 0538 04/12/17 0534  NA 140 141 140 137 136  K 4.0 4.0 3.7 4.1 4.0  CL 107 112* 112* 111 110  CO2 22 21* 21* 21* 21*  GLUCOSE 103* 104* 101* 143* 102*  BUN 26* 20 17 14 13   CREATININE 2.25* 1.18* 0.91 0.87 0.74  CALCIUM 8.7* 8.6* 8.7* 8.7* 8.3*   GFR Estimated Creatinine Clearance: 60.6 mL/min (by C-G formula based on SCr of 0.74 mg/dL). Liver Function Tests:  Recent Labs Lab 04/11/17 0538 04/12/17 0534  AST 19 19  ALT 10* 10*  ALKPHOS 52 48  BILITOT 0.4 0.5  PROT 5.9* 5.3*  ALBUMIN 2.3* 2.1*   No results for input(s): LIPASE, AMYLASE in the last 168 hours. No results for input(s): AMMONIA in the last 168 hours. Coagulation profile No results for input(s): INR, PROTIME in the last 168 hours.  CBC:  Recent Labs Lab 04/08/17 0549 04/08/17 0618 04/09/17 0510 04/10/17 0254 04/11/17 0538 04/12/17 0534  WBC 10.1  --  7.3 6.3 8.7 6.5  NEUTROABS 6.7  --   --   --   --   --   HGB 8.9* 8.8* 8.9* 8.1* 7.8* 6.8*  HCT 26.9* 26.0* 28.0* 26.1* 25.7* 21.8*  MCV 78.7  --  78.4 80.6 82.6 81.0  PLT 513*  --  441* 450* 396 330   Cardiac Enzymes:  Recent Labs Lab 04/08/17 1006  CKTOTAL 77   BNP (last 3 results) No results for input(s): PROBNP in the last 8760 hours. CBG: No results for  input(s): GLUCAP in the last 168 hours. D-Dimer: No results for input(s): DDIMER in the last 72 hours. Hgb A1c: No results for input(s): HGBA1C in the last 72 hours. Lipid Profile: No results for input(s): CHOL, HDL, LDLCALC, TRIG, CHOLHDL, LDLDIRECT in the last 72 hours. Thyroid function studies: No results for input(s): TSH, T4TOTAL, T3FREE, THYROIDAB in the last 72 hours.  Invalid input(s): FREET3 Anemia work up: No results for input(s): VITAMINB12, FOLATE, FERRITIN, TIBC, IRON, RETICCTPCT in the last 72 hours. Sepsis Labs:  Recent Labs Lab 04/08/17 0618 04/09/17 0510 04/10/17 0254 04/11/17 0538 04/12/17 0534  WBC  --  7.3 6.3 8.7 6.5  LATICACIDVEN 0.52  --   --   --   --    Microbiology Recent Results (from the past 240 hour(s))  Blood culture (routine x 2)     Status: None (Preliminary result)   Collection Time: 04/08/17  6:09 AM  Result Value Ref Range Status   Specimen Description BLOOD RIGHT ARM  Final   Special Requests IN PEDIATRIC BOTTLE Blood Culture adequate volume  Final   Culture   Final    NO GROWTH 4 DAYS Performed at [facility], [place] [address] [place], [place] [address]    Report Status PENDING  Incomplete  Blood culture (routine x 2)  Status: None (Preliminary result)   Collection Time: 04/08/17  6:10 AM  Result Value Ref Range Status   Specimen Description BLOOD LEFT FOREARM  Final   Special Requests IN PEDIATRIC BOTTLE Blood Culture adequate volume  Final   Culture   Final    NO GROWTH 4 DAYS Performed at [facility], [place] [address] [place], [place] [address]    Report Status PENDING  Incomplete  Surgical pcr screen     Status: None   Collection Time: 04/09/17  8:57 PM  Result Value Ref Range Status   MRSA, PCR NEGATIVE NEGATIVE Final   Staphylococcus aureus NEGATIVE NEGATIVE Final    Comment:        The Xpert SA Assay (FDA approved for NASAL specimens in patients over 44 years of age), is one component of a  comprehensive surveillance program.  Test performance has been validated by [facility] for patients greater than or equal to 36 year old. It is not intended to diagnose infection nor to guide or monitor treatment.      Medications:   . amLODipine  5 mg Oral Daily  . buPROPion  150 mg Oral Daily  . docusate sodium  100 mg Oral BID  . feeding supplement  1 Container Oral TID BM  . ferrous sulfate  325 mg Oral BID WC  . gabapentin  100 mg Oral TID  . heparin  5,000 Units Subcutaneous Q8H  . mirtazapine  7.5 mg Oral QHS  . multivitamin with minerals  1 tablet Oral Daily  . silver sulfADIAZINE   Topical Daily   Continuous Infusions: . sodium chloride 75 mL/hr at 04/12/17 0232  . methocarbamol (ROBAXIN)  IV        LOS: 3 days   Waldron Session  [facility] Pager [phone]  *Please refer to [email or web address], password [email or web address] to get updated schedule on who will round on this patient, as hospitalists switch teams weekly. If 7PM-7AM, please contact night-coverage at [email or web address], password [email or web address] for any overnight needs.  04/12/2017, 3:57 PM

## 2017-04-12 NOTE — Progress Notes (Signed)
CM received consult:  Equipment     Home health needs    Medication needs    Other (see comments                                          PT/OT/SLP DME as needed. Pt evaluated per PT, recommendation : CIR.  Pt has medicaid, not eligible for medication assistance. CM following for disposition needs. Whitman Hero  RN,BSN,CM

## 2017-04-12 NOTE — Discharge Instructions (Signed)
Quad Set / Hip Extension   With towel roll under calf of residual limb, tighten thigh muscle to straighten knee. Push down into towel roll to lift buttocks. Hold 3 seconds. Repeat 10 times. Do 2 sessions per day.    Hip Adduction   With towel roll between thighs, gently squeeze thighs together and down. Hold 3 seconds. Repeat 10 times. Do 2 sessions per day.            Knee AROM: Flexion / Extension   With towel roll behind knee, gently bend and straighten knee over the towel roll. Repeat 10 times. Do 2 sessions per day.    Hip / Knee AROM: Abduction / Adduction   Roll to sound side. Lift residual limb straight up and down while keeping hip straight. Repeat 10 times. Do 2 sessions per day.           Hip / Knee AROM: Flexion / Extension   Roll to sound side. Bring knee to chest while bending knee. Reach limb back as far as possible while straightening knee. Repeat 10 times. Do 2 sessions per day.  Copyright  VHI. All rights reserved.

## 2017-04-13 ENCOUNTER — Encounter (HOSPITAL_COMMUNITY): Payer: Self-pay | Admitting: *Deleted

## 2017-04-13 ENCOUNTER — Inpatient Hospital Stay (HOSPITAL_COMMUNITY)
Admission: RE | Admit: 2017-04-13 | Discharge: 2017-04-17 | DRG: 560 | Disposition: A | Discharge status: Home Health | Payer: Medicaid Other | Source: Intra-hospital | Attending: Physical Medicine & Rehabilitation | Admitting: Physical Medicine & Rehabilitation

## 2017-04-13 DIAGNOSIS — Z886 Allergy status to analgesic agent status: Secondary | ICD-10-CM

## 2017-04-13 DIAGNOSIS — I776 Arteritis, unspecified: Secondary | ICD-10-CM | POA: Diagnosis present

## 2017-04-13 DIAGNOSIS — Z89512 Acquired absence of left leg below knee: Secondary | ICD-10-CM | POA: Diagnosis not present

## 2017-04-13 DIAGNOSIS — Z888 Allergy status to other drugs, medicaments and biological substances status: Secondary | ICD-10-CM

## 2017-04-13 DIAGNOSIS — I10 Essential (primary) hypertension: Secondary | ICD-10-CM | POA: Diagnosis present

## 2017-04-13 DIAGNOSIS — E8809 Other disorders of plasma-protein metabolism, not elsewhere classified: Secondary | ICD-10-CM | POA: Diagnosis present

## 2017-04-13 DIAGNOSIS — F17210 Nicotine dependence, cigarettes, uncomplicated: Secondary | ICD-10-CM | POA: Diagnosis present

## 2017-04-13 DIAGNOSIS — G546 Phantom limb syndrome with pain: Secondary | ICD-10-CM | POA: Diagnosis not present

## 2017-04-13 DIAGNOSIS — R269 Unspecified abnormalities of gait and mobility: Secondary | ICD-10-CM

## 2017-04-13 DIAGNOSIS — F1410 Cocaine abuse, uncomplicated: Secondary | ICD-10-CM | POA: Diagnosis present

## 2017-04-13 DIAGNOSIS — D62 Acute posthemorrhagic anemia: Secondary | ICD-10-CM

## 2017-04-13 DIAGNOSIS — G8918 Other acute postprocedural pain: Secondary | ICD-10-CM

## 2017-04-13 DIAGNOSIS — Z791 Long term (current) use of non-steroidal anti-inflammatories (NSAID): Secondary | ICD-10-CM

## 2017-04-13 DIAGNOSIS — K5900 Constipation, unspecified: Secondary | ICD-10-CM | POA: Diagnosis present

## 2017-04-13 DIAGNOSIS — Z89511 Acquired absence of right leg below knee: Secondary | ICD-10-CM

## 2017-04-13 DIAGNOSIS — I169 Hypertensive crisis, unspecified: Secondary | ICD-10-CM

## 2017-04-13 DIAGNOSIS — Z4781 Encounter for orthopedic aftercare following surgical amputation: Principal | ICD-10-CM

## 2017-04-13 DIAGNOSIS — T8789 Other complications of amputation stump: Secondary | ICD-10-CM

## 2017-04-13 DIAGNOSIS — Z720 Tobacco use: Secondary | ICD-10-CM

## 2017-04-13 HISTORY — DX: Acquired absence of left leg below knee: Z89.511

## 2017-04-13 LAB — TYPE AND SCREEN
ABO/RH(D): A POS
ANTIBODY SCREEN: NEGATIVE
Unit division: 0

## 2017-04-13 LAB — COMPREHENSIVE METABOLIC PANEL
ALBUMIN: 2.1 g/dL — AB (ref 3.5–5.0)
ALK PHOS: 52 U/L (ref 38–126)
ALT: 10 U/L — AB (ref 14–54)
AST: 19 U/L (ref 15–41)
Anion gap: 6 (ref 5–15)
BILIRUBIN TOTAL: 0.6 mg/dL (ref 0.3–1.2)
BUN: 14 mg/dL (ref 6–20)
CO2: 23 mmol/L (ref 22–32)
CREATININE: 0.62 mg/dL (ref 0.44–1.00)
Calcium: 8.7 mg/dL — ABNORMAL LOW (ref 8.9–10.3)
Chloride: 110 mmol/L (ref 101–111)
GFR calc Af Amer: >60 mL/min (ref >60)
GLUCOSE: 96 mg/dL (ref 65–99)
Potassium: 3.8 mmol/L (ref 3.5–5.1)
Sodium: 139 mmol/L (ref 135–145)
TOTAL PROTEIN: 5.6 g/dL — AB (ref 6.5–8.1)

## 2017-04-13 LAB — CULTURE, BLOOD (ROUTINE X 2)
CULTURE: NO GROWTH
Culture: NO GROWTH
SPECIAL REQUESTS: ADEQUATE
Special Requests: ADEQUATE

## 2017-04-13 LAB — CBC
HCT: 27 % — ABNORMAL LOW (ref 36.0–46.0)
Hemoglobin: 8.7 g/dL — ABNORMAL LOW (ref 12.0–15.0)
MCH: 26 pg (ref 26.0–34.0)
MCHC: 32.2 g/dL (ref 30.0–36.0)
MCV: 80.8 fL (ref 78.0–100.0)
PLATELETS: 375 10*3/uL (ref 150–400)
RBC: 3.34 MIL/uL — AB (ref 3.87–5.11)
RDW: 15.7 % — ABNORMAL HIGH (ref 11.5–15.5)
WBC: 7.9 10*3/uL (ref 4.0–10.5)

## 2017-04-13 LAB — BPAM RBC
Blood Product Expiration Date: 201807132359
ISSUE DATE / TIME: 201807091521
UNIT TYPE AND RH: 6200

## 2017-04-13 MED ORDER — SORBITOL 70 % SOLN: 30.0000 mL | Freq: Every day | Status: DC | PRN

## 2017-04-13 MED ORDER — ONDANSETRON HCL 4 MG/2ML IJ SOLN: 4.0000 mg | Freq: Four times a day (QID) | INTRAMUSCULAR | Status: DC | PRN

## 2017-04-13 MED ORDER — POLYETHYLENE GLYCOL 3350 17 G PO PACK: 17.0000 g | PACK | Freq: Every day | ORAL | Status: DC | PRN

## 2017-04-13 MED ORDER — BISACODYL 10 MG RE SUPP: 10.0000 mg | Freq: Every day | RECTAL | Status: DC | PRN

## 2017-04-13 MED ORDER — AMLODIPINE BESYLATE 5 MG PO TABS: 5.0000 mg | ORAL_TABLET | Freq: Every day | ORAL | Status: DC

## 2017-04-13 MED ORDER — ACETAMINOPHEN 325 MG PO TABS: 650.0000 mg | ORAL_TABLET | Freq: Four times a day (QID) | ORAL | Status: DC | PRN

## 2017-04-13 MED ORDER — DOCUSATE SODIUM 100 MG PO CAPS
100.0000 mg | ORAL_CAPSULE | Freq: Two times a day (BID) | ORAL | Status: DC
  Administered 2017-04-13 – 2017-04-17 (×8): 100 mg via ORAL
  Filled 2017-04-13 (×8): qty 1

## 2017-04-13 MED ORDER — SILVER SULFADIAZINE 1 % EX CREA
TOPICAL_CREAM | Freq: Every day | CUTANEOUS | Status: DC
  Administered 2017-04-16: 10:00:00 via TOPICAL
  Filled 2017-04-13: qty 85

## 2017-04-13 MED ORDER — MAGNESIUM CITRATE PO SOLN: 1.0000 | Freq: Once | ORAL | Status: DC | PRN

## 2017-04-13 MED ORDER — ACETAMINOPHEN 650 MG RE SUPP: 650.0000 mg | Freq: Four times a day (QID) | RECTAL | Status: DC | PRN

## 2017-04-13 MED ORDER — BOOST / RESOURCE BREEZE PO LIQD
1.0000 | Freq: Three times a day (TID) | ORAL | Status: DC
  Administered 2017-04-14 – 2017-04-16 (×7): 1 via ORAL

## 2017-04-13 MED ORDER — HEPARIN SODIUM (PORCINE) 5000 UNIT/ML IJ SOLN
5000.0000 [IU] | Freq: Three times a day (TID) | INTRAMUSCULAR | Status: DC
  Administered 2017-04-13 – 2017-04-17 (×11): 5000 [IU] via SUBCUTANEOUS
  Filled 2017-04-13 (×12): qty 1

## 2017-04-13 MED ORDER — AMLODIPINE BESYLATE 5 MG PO TABS
5.0000 mg | ORAL_TABLET | Freq: Every day | ORAL | Status: DC
  Administered 2017-04-14: 5 mg via ORAL
  Filled 2017-04-13 (×2): qty 1

## 2017-04-13 MED ORDER — GABAPENTIN 100 MG PO CAPS
100.0000 mg | ORAL_CAPSULE | Freq: Three times a day (TID) | ORAL | Status: DC
  Administered 2017-04-13 – 2017-04-17 (×11): 100 mg via ORAL
  Filled 2017-04-13 (×11): qty 1

## 2017-04-13 MED ORDER — BUPROPION HCL ER (XL) 150 MG PO TB24: 150.0000 mg | ORAL_TABLET | Freq: Every day | ORAL | Status: DC

## 2017-04-13 MED ORDER — MIRTAZAPINE 15 MG PO TABS
7.5000 mg | ORAL_TABLET | Freq: Every day | ORAL | Status: DC
Start: 2017-04-13 — End: 2017-04-17
  Administered 2017-04-13 – 2017-04-16 (×4): 7.5 mg via ORAL
  Filled 2017-04-13 (×4): qty 1

## 2017-04-13 MED ORDER — POLYETHYLENE GLYCOL 3350 17 G PO PACK: 17.0000 g | PACK | Freq: Every day | ORAL | 0 refills | Status: DC | PRN

## 2017-04-13 MED ORDER — HEPARIN SODIUM (PORCINE) 5000 UNIT/ML IJ SOLN: 5000.0000 [IU] | Freq: Three times a day (TID) | INTRAMUSCULAR | Status: DC

## 2017-04-13 MED ORDER — METHOCARBAMOL 1000 MG/10ML IJ SOLN
500.0000 mg | Freq: Four times a day (QID) | INTRAVENOUS | Status: DC | PRN
  Filled 2017-04-13: qty 5

## 2017-04-13 MED ORDER — OXYCODONE HCL 5 MG PO TABS: 5.0000 mg | ORAL_TABLET | ORAL | 0 refills | Status: DC | PRN

## 2017-04-13 MED ORDER — OXYCODONE HCL 5 MG PO TABS
5.0000 mg | ORAL_TABLET | ORAL | Status: DC | PRN
  Administered 2017-04-13 – 2017-04-16 (×13): 10 mg via ORAL
  Filled 2017-04-13 (×13): qty 2

## 2017-04-13 MED ORDER — HYDROCODONE-ACETAMINOPHEN 5-325 MG PO TABS: 1.0000 | ORAL_TABLET | ORAL | 0 refills | Status: DC | PRN

## 2017-04-13 MED ORDER — GABAPENTIN 100 MG PO CAPS
100.0000 mg | ORAL_CAPSULE | Freq: Three times a day (TID) | ORAL | Status: DC
Start: 2017-04-13 — End: 2017-04-16

## 2017-04-13 MED ORDER — FERROUS SULFATE 325 (65 FE) MG PO TABS
325.0000 mg | ORAL_TABLET | Freq: Two times a day (BID) | ORAL | Status: DC
  Administered 2017-04-14 – 2017-04-17 (×7): 325 mg via ORAL
  Filled 2017-04-13 (×7): qty 1

## 2017-04-13 MED ORDER — METHOCARBAMOL 500 MG PO TABS
500.0000 mg | ORAL_TABLET | Freq: Four times a day (QID) | ORAL | Status: DC | PRN
  Administered 2017-04-14 (×2): 500 mg via ORAL
  Filled 2017-04-13 (×2): qty 1

## 2017-04-13 MED ORDER — ADULT MULTIVITAMIN W/MINERALS CH
1.0000 | ORAL_TABLET | Freq: Every day | ORAL | Status: DC
  Administered 2017-04-14 – 2017-04-17 (×4): 1 via ORAL
  Filled 2017-04-13 (×4): qty 1

## 2017-04-13 MED ORDER — ONDANSETRON HCL 4 MG PO TABS: 4.0000 mg | ORAL_TABLET | Freq: Four times a day (QID) | ORAL | Status: DC | PRN

## 2017-04-13 MED ORDER — BUPROPION HCL ER (XL) 150 MG PO TB24
150.0000 mg | ORAL_TABLET | Freq: Every day | ORAL | Status: DC
  Administered 2017-04-14 – 2017-04-17 (×4): 150 mg via ORAL
  Filled 2017-04-13 (×4): qty 1

## 2017-04-13 NOTE — H&P (Signed)
Physical Medicine and Rehabilitation Admission H&P     Chief Complaint  Patient presents with  . Wound Check  . Foot Pain  :  HPI: [name] is a 56 y.o. 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 Per chart review and patient, patient lives in a local boarding house. Independent with electric scooter prior to admission In the community and used a walker while in the home. She has an aunt and a brother who work at the boarding home who can assist . Presented 04/08/2017 with increasing leg pain as well as foul-smelling ulcerations lower extremities with exposed bone and necrotic tissue. Underwent bilateral transtibial amputation 04/10/2017 per Dr. Sharol Given. Hospital course pain management. Acute blood loss anemia and transfused. Subcutaneous heparin for DVT prophylaxis. Physical therapy evaluation completed with recommendations of physical medicine rehabilitation consult.Patient was admitted for a comprehensive rehabilitation program  Review of Systems  Constitutional: Negative for chills and fever.  HENT: Negative for hearing loss.  Eyes: Negative for blurred vision and double vision.  Respiratory: Positive for shortness of breath.  Cardiovascular: Positive for leg swelling. Negative for palpitations.  Gastrointestinal: Positive for constipation. Negative for nausea and vomiting.  Genitourinary: Negative for dysuria, flank pain and hematuria.  Musculoskeletal: Positive for joint pain and myalgias.  Skin: Negative for rash.  Neurological: Positive for weakness and headaches. Negative for seizures.  Psychiatric/Behavioral: Positive for depression.  All other systems reviewed and are negative.       Past Medical History:  Diagnosis Date  . CAP (community acquired pneumonia) 03/2014 X 2  . Cocaine abuse    ongoing with resultant vaculitis.    . Depression   . Headache    "weekly" (07/29/2016)  . Hypertension   . Inflammatory arthritis   . Migraines    "probably 5-6/yr" (07/29/2016)  . Normocytic anemia    BL Hgb 9.8-12. Last anemia panel 04/2010 - showing Fe 19, ferritin 101. Pt on monthly B12 injections  . Rheumatoid arthritis(714.0)    patient reported  . VASCULITIS 04/17/2010   2/2 levimasole toxicity vs autoimmune d/o ; 2/2 Levimasole toxicity. Followed by Dr. Louanne Skye        Past Surgical History:  Procedure Laterality Date  . AMPUTATION Left 05/22/2016   Procedure: AMPUTATION LEFT LONG FINGER; Surgeon: Marybelle Killings, MD; Location: [facility]; Service: Orthopedics; Laterality: Left;  . AMPUTATION Bilateral 04/10/2017   Procedure: AMPUTATION BELOW KNEE; Surgeon: Newt Minion, MD; Location: [facility]; Service: Orthopedics; Laterality: Bilateral;  . HERNIA REPAIR     "stomach"  . I&D EXTREMITY Right 09/26/2015   Procedure: IRRIGATION AND DEBRIDEMENT LEG WOUND VAC PLACEMENT.; Surgeon: Loel Lofty Dillingham, DO; Location: [place]; Service: Plastics; Laterality: Right;  . INCISION AND DRAINAGE OF WOUND Bilateral 10/20/2016   Procedure: IRRIGATION AND DEBRIDEMENT WOUND BILATERAL; Surgeon: Edrick Kins, DPM; Location: [facility]; Service: Podiatry; Laterality: Bilateral;  . IRRIGATION AND DEBRIDEMENT ABSCESS Bilateral 09/26/2013   Procedure: DEBRIDEMENT ULCERS BILATERAL THIGHS; Surgeon: Gwenyth Ober, MD; Location: [facility]; Service: General; Laterality: Bilateral;  . SKIN BIOPSY Bilateral    shin nodules        Family History  Problem Relation Age of Onset  . Breast cancer Mother    Breast cancer  . Alcohol abuse Mother   . Colon cancer Maternal Aunt 31  . Alcohol abuse Father    Social History: reports that she has been  smoking Cigarettes. She has a 4.56 pack-year smoking history. She has never used smokeless tobacco. She reports that she uses drugs, including "Crack" cocaine and Cocaine. She reports that she does not drink alcohol.   Allergies:       Allergies  Allergen Reactions  . Acetaminophen Swelling and Other (See Comments)    Reaction: Eyelid swelling  . Other Other (See Comments)    Pt states she is allergic to a blood pressure medication, but does not know the reaction or medication         Medications Prior to Admission  Medication Sig Dispense Refill  . naproxen sodium (ANAPROX) 220 MG tablet Take 220 mg by mouth 2 (two) times daily with a meal.    . ferrous sulfate 325 (65 FE) MG tablet Take 1 tablet (325 mg total) by mouth 2 (two) times daily with a meal. (Patient not taking: Reported on 03/30/2017) 60 tablet 3  . Olmesartan-Amlodipine-HCTZ (TRIBENZOR) 40-10-12.5 MG TABS Take 1 tablet by mouth daily. (Patient not taking: Reported on 04/08/2017) 30 tablet 0   Home:  [place] expects to be discharged to:: Unsure  Living Arrangements: Other (Comment) (Boarding house with some of her relatives present)  Available Help at Discharge: Family, Friend(s), Available 24 hours/day ("They are around all of the time")  Type of Home: House ([place])  Home Access: Stairs to enter  Technical brewer of Steps: 3  Entrance Stairs-Rails: Left  Home Layout: Two level, Able to live on main level with bedroom/bathroom  Home Equipment: Kasandra Knudsen - single point, Environmental consultant - 2 wheels, Wheelchair - manual, Wheelchair - power, Civil engineer, contracting (Battery is dead in Engineer, manufacturing systems)  Functional History:  Prior Function  Level of Independence: Needs assistance  Gait / Transfers Assistance Needed: Used RW or WC for mobility. Reports her friends help her if she needs to get down the steps.  ADL's / Homemaking Assistance Needed: Reports she could bathe and dress independently. They prepare their own food at house.  Functional Status:  Mobility:  Bed Mobility  Overal bed mobility: Needs Assistance  Bed Mobility: Supine to Sit  Supine to sit: Supervision  Sit to supine: Supervision  General bed mobility  comments: pt able to prop self onto elbows and then to hands and scoot to EOB  Transfers  Overall transfer level: Needs assistance  Transfers: Therapist, art transfers: Min assist  General transfer comment: posterior transfer into recliner with min A. Pt very strong UE's and once able to get arms of chair could scoot with supervision    ADL:   Cognition:  Cognition  Overall Cognitive Status: Within Functional Limits for tasks assessed  Orientation Level: Oriented X4  Cognition  Arousal/Alertness: Awake/alert  Behavior During Therapy: WFL for tasks assessed/performed, Flat affect  Overall Cognitive Status: Within Functional Limits for tasks assessed  Physical Exam:  Blood pressure (!) 149/84, pulse 86, temperature 98.8 F (37.1 C), resp. rate 18, height 5' 1"  (1.549 m), weight 47.7 kg (105 lb 1.6 oz), SpO2 100 %.  Physical Exam  Constitutional:  56 year old right-handed female.  HENT:  Head: Normocephalic.  Eyes: EOM are normal.  Neck: Normal range of motion. Neck supple. No thyromegaly present.  Cardiovascular: Normal rate, regular rhythm and normal heart sounds. Exam reveals no friction rub.  No murmur heard.  Respiratory: Breath sounds normal. No respiratory distress. She has no wheezes.  GI: Soft. Bowel sounds are normal. She exhibits no distension. There is no tenderness. There is no rebound  and no guarding.  Psychiatric: She has a normal mood and affect. Her behavior is normal. Thought content normal.  Skin. Bilateral transtibial amputation sites are dressed with wound VACs in place  Musculoskeletal: She exhibits tenderness. She exhibits no edema. She is able to nearly extend both knees. (-5 degrees from full extension bilaterally) Neurological: She is alert and oriented to person, place, and time.  Motor: 5/5 Bilateral UE prox to distal. LE: HF 3+ to 4-/5. KE 3-/5.  Sensation intact to light touch in all 4's.  Lab Results Last 48 Hours  Imaging Results (Last 48 hours)     Medical Problem List and Plan:  1. Decreased functional mobility secondary to bilateral transtibial amputation 04/10/2017  -admit to inpatient rehab  2. DVT Prophylaxis/Anticoagulation: Subcutaneous heparin. Monitor platelet counts and any signs of bleeding  3. Pain Management: pt with stump and phantom limb pain  - Neurontin 100 mg 3 times a day, oxycodone and Robaxin as needed  -reviewed massage and sensory feed back to limbs as well.  4. Mood: Wellbutrin 150 mg daily, Remeron 7.5 mg daily at bedtime  5. Neuropsych: This patient is capable of making decisions on her own behalf.  6. Skin/Wound Care: Routine skin checks  -continue vac therapy for 7 days per Dr. Sharol Given.  7. Fluids/Electrolytes/Nutrition: Routine I&O with follow-up chemistries  8. Acute blood loss anemia. Continue iron supplement. Follow-up CBC  9. History of hypertension. Norvasc 5 mg daily  10. History of cocaine abuse. Provide counseling  11.History of MRSA PCR screen positive. Contact precautions  12. Constipation. Laxative assistance  Post Admission Physician Evaluation:  1. Functional deficits secondary to bilateral below knee amputations. 2. Patient is admitted to receive collaborative, interdisciplinary care between the physiatrist, rehab nursing staff, and therapy team. 3. Patient's  level of medical complexity and substantial therapy needs in context of that medical necessity cannot be provided at a lesser intensity of care such as a SNF. 4. Patient has experienced substantial functional loss from his/her baseline which was documented above under the "Functional History" and "Functional Status" headings. Judging by the patient's diagnosis, physical exam, and functional history, the patient has potential for functional progress which will result in measurable gains while on inpatient rehab. These gains will be of substantial and practical use upon discharge in facilitating mobility and self-care at the household level. 5. Physiatrist will provide 24 hour management of medical needs as well as oversight of the therapy plan/treatment and provide guidance as appropriate regarding the interaction of the two. 6. The Preadmission Screening has been reviewed and patient status is unchanged unless otherwise stated above. 7. 24 hour rehab nursing will assist with bladder management, bowel management, safety, skin/wound care, disease management, medication administration, pain management and patient education and help integrate therapy concepts, techniques,education, etc. 8. PT will assess and treat for/with: Lower extremity strength, range of motion, stamina, balance, functional mobility, safety, adaptive techniques and equipment, pre-prosthetic education, w/c assessment (pt has power wheelchair with "dead battery"), community reintegration, family ed. Goals are: mod I at w/c level except for home entry. 9. OT will assess and treat for/with: ADL's, functional mobility, safety, upper extremity strength, adaptive techniques and equipment. Goals are: mod I at w/c level. Therapy may proceed with showering this patient. 10. SLP will assess and treat for/with: n/a. Goals are: n/a. 11. Case Management and Social Worker will assess and treat for psychological issues and discharge planning. 12. Team  conference will be held weekly to assess progress toward goals and to determine barriers to discharge. 13. Patient will receive at least 3 hours of therapy per day at least 5 days per week. 14. ELOS: 5-7 days  15. Prognosis: excellent Meredith Staggers, MD, [place] [facility]  04/13/2017  Cathlyn Parsons., PA-C  04/13/2017

## 2017-04-13 NOTE — Consult Note (Signed)
Physical Medicine and Rehabilitation Consult Reason for Consult: Decreased functional mobility Referring Physician: [facility]   HPI: [name] is a 56 y.o. 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 Per chart review and patient, patient lives in a local boarding house. Independent with electric scooter prior to admission. Her aunt is also a resident of the boarding home that can assist. Presented 04/08/2017 with increasing leg pain as well as foul-smelling ulcerations lower extremities with exposed bone and necrotic tissue. Underwent bilateral transtibial amputation 04/10/2017 per Dr. Sharol Given. Hospital course pain management. Acute blood loss anemia and transfused. Subcutaneous heparin for DVT prophylaxis. Physical therapy evaluation completed with recommendations of physical medicine rehabilitation consult.   Review of Systems  Constitutional: Negative for chills and fever.  HENT: Negative for hearing loss.   Eyes: Negative for blurred vision and double vision.  Respiratory: Positive for shortness of breath. Negative for cough.   Cardiovascular: Negative for chest pain.  Gastrointestinal: Positive for constipation. Negative for nausea and vomiting.  Genitourinary: Negative for dysuria, flank pain and hematuria.  Musculoskeletal: Positive for joint pain and myalgias.  Skin: Negative for rash.  Neurological: Positive for headaches. Negative for seizures.  Psychiatric/Behavioral: Positive for depression.  All other systems reviewed and are negative.  Past Medical History:  Diagnosis Date  . CAP (community acquired pneumonia) 03/2014 X 2  . Cocaine abuse    ongoing with resultant vaculitis.  . Depression   . Headache    "weekly" (07/29/2016)  . Hypertension   . Inflammatory arthritis   . Migraines    "probably 5-6/yr"  (07/29/2016)  . Normocytic anemia    BL Hgb 9.8-12. Last anemia panel 04/2010 - showing Fe 19, ferritin 101.  Pt on monthly B12 injections  . Rheumatoid arthritis(714.0)    patient reported  . 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 Followed by Dr. Louanne Skye   Past Surgical History:  Procedure Laterality Date  . AMPUTATION Left 05/22/2016   Procedure: AMPUTATION LEFT LONG FINGER;  Surgeon: Marybelle Killings, MD;  Location: [facility];  Service: Orthopedics;  Laterality: Left;  . AMPUTATION Bilateral 04/10/2017   Procedure: AMPUTATION BELOW KNEE;  Surgeon: Newt Minion, MD;  Location: [facility];  Service: Orthopedics;  Laterality: Bilateral;  . HERNIA REPAIR     "stomach"  . I&D EXTREMITY Right 09/26/2015   Procedure: IRRIGATION AND DEBRIDEMENT LEG WOUND  VAC PLACEMENT.;  Surgeon: Loel Lofty Dillingham, DO;  Location: [facility];  Service: Plastics;  Laterality: Right;  . INCISION AND DRAINAGE OF WOUND Bilateral 10/20/2016   Procedure: IRRIGATION AND DEBRIDEMENT WOUND BILATERAL;  Surgeon: Edrick Kins, DPM;  Location: [facility];  Service: Podiatry;  Laterality: Bilateral;  . IRRIGATION AND DEBRIDEMENT ABSCESS Bilateral 09/26/2013   Procedure: DEBRIDEMENT ULCERS BILATERAL THIGHS;  Surgeon: Gwenyth Ober, MD;  Location: [facility];  Service: General;  Laterality: Bilateral;  . SKIN BIOPSY Bilateral    shin nodules   Family History  Problem Relation Age of Onset  . Breast cancer Mother        Breast cancer  . Alcohol abuse Mother   . Colon cancer Maternal Aunt 63  . Alcohol abuse Father    Social History:  reports that she has been smoking Cigarettes.  She has a 4.56 pack-year smoking history. She has never used  smokeless tobacco. She reports that she uses drugs, including "Crack" cocaine and Cocaine. She reports that she does not drink alcohol. Allergies:  Allergies  Allergen Reactions  . Acetaminophen Swelling and Other (See Comments)    Reaction:  Eyelid  swelling  . Other Other (See Comments)    Pt states she is allergic to a blood pressure medication, but does not know the reaction or medication   Medications Prior to Admission  Medication Sig Dispense Refill  . naproxen sodium (ANAPROX) 220 MG tablet Take 220 mg by mouth 2 (two) times daily with a meal.    . ferrous sulfate 325 (65 FE) MG tablet Take 1 tablet (325 mg total) by mouth 2 (two) times daily with a meal. (Patient not taking: Reported on 03/30/2017) 60 tablet 3  . Olmesartan-Amlodipine-HCTZ (TRIBENZOR) 40-10-12.5 MG TABS Take 1 tablet by mouth daily. (Patient not taking: Reported on 04/08/2017) 30 tablet 0    Home: [place] expects to be discharged to:: Unsure Living Arrangements: Other (Comment) (Boarding house with some of her relatives present) Available Help at Discharge: Family, Friend(s), Available 24 hours/day ("They are around all of the time") Type of Home: House ([place]) Home Access: Stairs to enter Technical brewer of Steps: 3 Entrance Stairs-Rails: Left Home Layout: Two level, Able to live on main level with bedroom/bathroom Home Equipment: Kasandra Knudsen - single point, Environmental consultant - 2 wheels, Wheelchair - manual, Wheelchair - power, Civil engineer, contracting (Battery is dead in Engineer, manufacturing systems)  Functional History: Prior Function Level of Independence: Needs assistance Gait / Transfers Assistance Needed: Used RW or WC for mobility. Reports her friends help her if she needs to get down the steps.  ADL's / Homemaking Assistance Needed: Reports she could bathe and dress independently.  They prepare their own food at house. Functional Status:  Mobility: Bed Mobility Overal bed mobility: Needs Assistance Bed Mobility: Supine to Sit Supine to sit: Supervision Sit to supine: Supervision General bed mobility comments: pt able to prop self onto elbows and then to hands and scoot to EOB Transfers Overall transfer level: Needs assistance Transfers:  Government social research officer transfers: Min assist General transfer comment: posterior transfer into recliner with min A. Pt very strong UE's and once able to get arms of chair could scoot with supervision      ADL:    Cognition: Cognition Overall Cognitive Status: Within Functional Limits for tasks assessed Orientation Level: Oriented X4 Cognition Arousal/Alertness: Awake/alert Behavior During Therapy: WFL for tasks assessed/performed, Flat affect Overall Cognitive Status: Within Functional Limits for tasks assessed  Blood pressure (!) 162/78, pulse (!) 101, temperature 99.2 F (37.3 C), resp. rate 18, height 5' 1"  (1.549 m), weight 47.2 kg (104 lb 0.9 oz), SpO2 97 %. Physical Exam  Vitals reviewed. Constitutional: She is oriented to person, place, and time. She appears well-developed.  56 year old female appearing older than stated age Cachectic  HENT:  Head: Normocephalic and atraumatic.  Eyes: EOM are normal. Right eye exhibits no discharge. Left eye exhibits no discharge.  Neck: Normal range of motion. Neck supple.  Cardiovascular: Normal rate and regular rhythm.   Respiratory: Effort normal and breath sounds normal. No respiratory distress.  GI: Soft. Bowel sounds are normal.  Musculoskeletal: She exhibits tenderness. She exhibits no edema.  Neurological: She is alert and oriented to person, place, and time.  Motor: 5/5 B/l UE, B/l HF (knee extension limited by pain0 Sensation intact to light touch  Skin:  Bilateral transtibial amputation sites are dressed appropriately tender with wound  VAC  Psychiatric: She has a normal mood and affect. Her behavior is normal.    Results for orders placed or performed during the hospital encounter of 04/08/17 (from the past 24 hour(s))  Type and screen [place]     Status: None (Preliminary result)   Collection Time: 04/12/17  7:04 AM  Result Value Ref Range   ABO/RH(D) A POS    Antibody  Screen NEG    Sample Expiration 04/15/2017    Unit Number [id number]    Blood Component Type RED CELLS,LR    Unit division 00    Status of Unit ISSUED    Transfusion Status OK TO TRANSFUSE    Crossmatch Result Compatible   Prepare RBC     Status: None   Collection Time: 04/12/17  7:04 AM  Result Value Ref Range   Order Confirmation ORDER PROCESSED BY BLOOD BANK   ABO/Rh     Status: None   Collection Time: 04/12/17  7:04 AM  Result Value Ref Range   ABO/RH(D) A POS   Hemoglobin and hematocrit, blood     Status: Abnormal   Collection Time: 04/12/17  7:58 PM  Result Value Ref Range   Hemoglobin 9.1 (L) 12.0 - 15.0 g/dL   HCT 28.5 (L) 36.0 - 46.0 %  CBC     Status: Abnormal   Collection Time: 04/13/17  3:55 AM  Result Value Ref Range   WBC 7.9 4.0 - 10.5 K/uL   RBC 3.34 (L) 3.87 - 5.11 MIL/uL   Hemoglobin 8.7 (L) 12.0 - 15.0 g/dL   HCT 27.0 (L) 36.0 - 46.0 %   MCV 80.8 78.0 - 100.0 fL   MCH 26.0 26.0 - 34.0 pg   MCHC 32.2 30.0 - 36.0 g/dL   RDW 15.7 (H) 11.5 - 15.5 %   Platelets 375 150 - 400 K/uL  Comprehensive metabolic panel     Status: Abnormal   Collection Time: 04/13/17  3:55 AM  Result Value Ref Range   Sodium 139 135 - 145 mmol/L   Potassium 3.8 3.5 - 5.1 mmol/L   Chloride 110 101 - 111 mmol/L   CO2 23 22 - 32 mmol/L   Glucose, Bld 96 65 - 99 mg/dL   BUN 14 6 - 20 mg/dL   Creatinine, Ser 0.62 0.44 - 1.00 mg/dL   Calcium 8.7 (L) 8.9 - 10.3 mg/dL   Total Protein 5.6 (L) 6.5 - 8.1 g/dL   Albumin 2.1 (L) 3.5 - 5.0 g/dL   AST 19 15 - 41 U/L   ALT 10 (L) 14 - 54 U/L   Alkaline Phosphatase 52 38 - 126 U/L   Total Bilirubin 0.6 0.3 - 1.2 mg/dL   GFR calc non Af Amer >60 >60 mL/min   GFR calc Af Amer >60 >60 mL/min   Anion gap 6 5 - 15   No results found.  Assessment/Plan: Diagnosis: Bilateral BKAs Labs independently reviewed.  Records reviewed and summated above. Clean amputation daily with soap and water Monitor incision site for signs of infection or  impending skin breakdown. Staples to remain in place for 3-4 weeks Stump shrinker, for edema control  Scar mobilization massaging to prevent soft tissue adherence Stump protector during therapies Prevent flexion contractures by implementing the following:   Encourage prone lying for 20-30 mins per day BID to avoid hip flexion  Contractures if medically appropriate;  Avoid pillow under knees when patient is lying in bed in order to prevent both  knee and hip flexion contractures;  Avoid prolonged sitting Post surgical pain control with oral medication Phantom limb pain control with physical modalities including desensitization techniques (gentle self massage to the residual stump,hot packs if sensation intact, [place]) and mirror therapy, TENS. If ineffective, consider pharmacological treatment for neuropathic pain (e.g gabapentin, pregabalin, amytriptalyine, duloxetine).  When using wheelchair, patient should have knee on amputated side fully extended with board under the seat cushion.  1. Does the need for close, 24 hr/day medical supervision in concert with the patient's rehab needs make it unreasonable for this patient to be served in a less intensive setting? Potentially  2. Co-Morbidities requiring supervision/potential complications: cocaine and tobacco abuse (counsel), HTN (monitor and provide prns in accordance with increased physical exertion and pain), cocaine induced vasculitis, post-op pain (Biofeedback training with therapies to help reduce reliance on opiate pain medications, particularly IV dilaudid, monitor pain control during therapies, and sedation at rest and titrate to maximum efficacy to ensure participation and gains in therapies), Acute blood loss anemia (transfuse if necessary to ensure appropriate perfusion for increased activity tolerance) 3. Due to safety, skin/wound care, disease management, pain management and patient education, does the patient require 24 hr/day rehab nursing?  Yes 4. Does the patient require coordinated care of a physician, rehab nurse, PT (1-2 hrs/day, 5 days/week) and OT (1-2 hrs/day, 5 days/week) to address physical and functional deficits in the context of the above medical diagnosis(es)? Yes Addressing deficits in the following areas: balance, endurance, locomotion, strength, transferring, bathing, dressing, toileting and psychosocial support 5. Can the patient actively participate in an intensive therapy program of at least 3 hrs of therapy per day at least 5 days per week? Potentially 6. The potential for patient to make measurable gains while on inpatient rehab is good 7. Anticipated functional outcomes upon discharge from inpatient rehab are Mod I at wheelchair level  with PT, Mod I at wheelchair level with OT, n/a with SLP. 8. Estimated rehab length of stay to reach the above functional goals is: 4-6 days. 9. Anticipated D/C setting: Home 10. Anticipated post D/C treatments: HH therapy and Home excercise program 11. Overall Rehab/Functional Prognosis: good  RECOMMENDATIONS: This patient's condition is appropriate for continued rehabilitative care in the following setting: Pt making good progress with therapeis.  Will cont to follow as pain improves and consider CIR if functional deficits persist. Patient has agreed to participate in recommended program. Potentially Note that insurance prior authorization may be required for reimbursement for recommended care.  Comment: Rehab Admissions Coordinator to follow up.  Delice Lesch, MD, Mellody Drown Cathlyn Parsons., PA-C 04/13/2017

## 2017-04-13 NOTE — H&P (Signed)
Physical Medicine and Rehabilitation Admission H&P    Chief Complaint  Patient presents with  . Wound Check  . Foot Pain  : HPI:  [name] is a 56 y.o. 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 Per chart review and patient, patient lives in a local boarding house. Independent with electric scooter prior to admission In the community and used a walker while in the home. She has an aunt and a brother who work at the boarding home who can assist . Presented 04/08/2017 with increasing leg pain as well as foul-smelling ulcerations lower extremities with exposed bone and necrotic tissue. Underwent bilateral transtibial amputation 04/10/2017 per Dr. Sharol Given. Hospital course pain management. Acute blood loss anemia and transfused. Subcutaneous heparin for DVT prophylaxis. Physical therapy evaluation completed with recommendations of physical medicine rehabilitation consult.Patient was admitted for a comprehensive rehabilitation program  Review of Systems  Constitutional: Negative for chills and fever.  HENT: Negative for hearing loss.   Eyes: Negative for blurred vision and double vision.  Respiratory: Positive for shortness of breath.   Cardiovascular: Positive for leg swelling. Negative for palpitations.  Gastrointestinal: Positive for constipation. Negative for nausea and vomiting.  Genitourinary: Negative for dysuria, flank pain and hematuria.  Musculoskeletal: Positive for joint pain and myalgias.  Skin: Negative for rash.  Neurological: Positive for weakness and headaches. Negative for seizures.  Psychiatric/Behavioral: Positive for depression.  All other systems reviewed and are negative.  Past Medical History:  Diagnosis Date  . CAP (community acquired pneumonia) 03/2014 X 2  . Cocaine abuse    ongoing with resultant vaculitis.    . Depression   . Headache    "weekly" (07/29/2016)  . Hypertension   . Inflammatory arthritis   . Migraines    "probably 5-6/yr" (07/29/2016)  . Normocytic anemia    BL Hgb 9.8-12. Last anemia panel 04/2010 - showing Fe 19, ferritin 101.  Pt on monthly B12 injections  . Rheumatoid arthritis(714.0)    patient reported  . 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 Followed by Dr. Louanne Skye   Past Surgical History:  Procedure Laterality Date  . AMPUTATION Left 05/22/2016   Procedure: AMPUTATION LEFT LONG FINGER;  Surgeon: Marybelle Killings, MD;  Location: [facility];  Service: Orthopedics;  Laterality: Left;  . AMPUTATION Bilateral 04/10/2017   Procedure: AMPUTATION BELOW KNEE;  Surgeon: Newt Minion, MD;  Location: [facility];  Service: Orthopedics;  Laterality: Bilateral;  . HERNIA REPAIR     "stomach"  . I&D EXTREMITY Right 09/26/2015   Procedure: IRRIGATION AND DEBRIDEMENT LEG WOUND  VAC PLACEMENT.;  Surgeon: Loel Lofty Dillingham, DO;  Location: [facility];  Service: Plastics;  Laterality: Right;  . INCISION AND DRAINAGE OF WOUND Bilateral 10/20/2016   Procedure: IRRIGATION AND DEBRIDEMENT WOUND BILATERAL;  Surgeon: Edrick Kins, DPM;  Location: [facility];  Service: Podiatry;  Laterality: Bilateral;  . IRRIGATION AND DEBRIDEMENT ABSCESS Bilateral 09/26/2013   Procedure: DEBRIDEMENT ULCERS BILATERAL THIGHS;  Surgeon: Gwenyth Ober, MD;  Location: [facility];  Service: General;  Laterality: Bilateral;  . SKIN BIOPSY Bilateral    shin nodules   Family History  Problem Relation Age of Onset  . Breast cancer Mother        Breast cancer  . Alcohol abuse Mother   . Colon cancer Maternal  Aunt 50  . Alcohol abuse Father    Social History:  reports that she has been smoking Cigarettes.  She has a 4.56 pack-year smoking history. She has never used smokeless tobacco. She reports that she uses drugs, including "Crack" cocaine and Cocaine. She reports that she does not  drink alcohol. Allergies:  Allergies  Allergen Reactions  . Acetaminophen Swelling and Other (See Comments)    Reaction:  Eyelid swelling  . Other Other (See Comments)    Pt states she is allergic to a blood pressure medication, but does not know the reaction or medication   Medications Prior to Admission  Medication Sig Dispense Refill  . naproxen sodium (ANAPROX) 220 MG tablet Take 220 mg by mouth 2 (two) times daily with a meal.    . ferrous sulfate 325 (65 FE) MG tablet Take 1 tablet (325 mg total) by mouth 2 (two) times daily with a meal. (Patient not taking: Reported on 03/30/2017) 60 tablet 3  . Olmesartan-Amlodipine-HCTZ (TRIBENZOR) 40-10-12.5 MG TABS Take 1 tablet by mouth daily. (Patient not taking: Reported on 04/08/2017) 30 tablet 0    Home: [place] expects to be discharged to:: Unsure Living Arrangements: Other (Comment) (Boarding house with some of her relatives present) Available Help at Discharge: Family, Friend(s), Available 24 hours/day ("They are around all of the time") Type of Home: House ([place]) Home Access: Stairs to enter Technical brewer of Steps: 3 Entrance Stairs-Rails: Left Home Layout: Two level, Able to live on main level with bedroom/bathroom Home Equipment: Kasandra Knudsen - single point, Environmental consultant - 2 wheels, Wheelchair - manual, Wheelchair - power, Civil engineer, contracting (Battery is dead in Engineer, manufacturing systems)   Functional History: Prior Function Level of Independence: Needs assistance Gait / Transfers Assistance Needed: Used RW or WC for mobility. Reports her friends help her if she needs to get down the steps.  ADL's / Homemaking Assistance Needed: Reports she could bathe and dress independently.  They prepare their own food at house.  Functional Status:  Mobility: Bed Mobility Overal bed mobility: Needs Assistance Bed Mobility: Supine to Sit Supine to sit: Supervision Sit to supine: Supervision General bed mobility comments: pt able  to prop self onto elbows and then to hands and scoot to EOB Transfers Overall transfer level: Needs assistance Transfers: Government social research officer transfers: Min assist General transfer comment: posterior transfer into recliner with min A. Pt very strong UE's and once able to get arms of chair could scoot with supervision      ADL:    Cognition: Cognition Overall Cognitive Status: Within Functional Limits for tasks assessed Orientation Level: Oriented X4 Cognition Arousal/Alertness: Awake/alert Behavior During Therapy: WFL for tasks assessed/performed, Flat affect Overall Cognitive Status: Within Functional Limits for tasks assessed  Physical Exam: Blood pressure (!) 149/84, pulse 86, temperature 98.8 F (37.1 C), resp. rate 18, height _0  (1.549 m), weight 47.7 kg (105 lb 1.6 oz), SpO2 100 %. Physical Exam  Constitutional:  56 year old right-handed female.  HENT:  Head: Normocephalic.  Eyes: EOM are normal.  Neck: Normal range of motion. Neck supple. No thyromegaly present.  Cardiovascular: Normal rate, regular rhythm and normal heart sounds.  Exam reveals no friction rub.   No murmur heard. Respiratory: Breath sounds normal. No respiratory distress. She has no wheezes.  GI: Soft. Bowel sounds are normal. She exhibits no distension. There is no tenderness. There is no rebound and no guarding.  Psychiatric: She has a normal mood and affect. Her behavior is normal. Thought content  normal.  Skin. Bilateral transtibial amputation sites are dressed with wound VACs in place Musculoskeletal: She exhibits tenderness. She exhibits no edema. She is able to nearly extend both knees. (-5 degrees from full extension bilaterally) Neurological: She is alert and oriented to person, place, and time.  Motor: 5/5 Bilateral UE prox to distal. LE: HF 3+ to 4-/5. KE 3-/5.  Sensation intact to light touch in all 4's.    Results for orders placed or performed during the  hospital encounter of 04/08/17 (from the past 48 hour(s))  CBC     Status: Abnormal   Collection Time: 04/12/17  5:34 AM  Result Value Ref Range   WBC 6.5 4.0 - 10.5 K/uL   RBC 2.69 (L) 3.87 - 5.11 MIL/uL   Hemoglobin 6.8 (LL) 12.0 - 15.0 g/dL    Comment: REPEATED TO VERIFY CRITICAL RESULT CALLED TO, READ BACK BY AND VERIFIED WITH: MAHEE S RN AT 7902 ON 07.09.2018 BY COCHRANE S    HCT 21.8 (L) 36.0 - 46.0 %   MCV 81.0 78.0 - 100.0 fL   MCH 25.3 (L) 26.0 - 34.0 pg   MCHC 31.2 30.0 - 36.0 g/dL   RDW 16.4 (H) 11.5 - 15.5 %   Platelets 330 150 - 400 K/uL  Comprehensive metabolic panel     Status: Abnormal   Collection Time: 04/12/17  5:34 AM  Result Value Ref Range   Sodium 136 135 - 145 mmol/L   Potassium 4.0 3.5 - 5.1 mmol/L   Chloride 110 101 - 111 mmol/L   CO2 21 (L) 22 - 32 mmol/L   Glucose, Bld 102 (H) 65 - 99 mg/dL   BUN 13 6 - 20 mg/dL   Creatinine, Ser 0.74 0.44 - 1.00 mg/dL   Calcium 8.3 (L) 8.9 - 10.3 mg/dL   Total Protein 5.3 (L) 6.5 - 8.1 g/dL   Albumin 2.1 (L) 3.5 - 5.0 g/dL   AST 19 15 - 41 U/L   ALT 10 (L) 14 - 54 U/L   Alkaline Phosphatase 48 38 - 126 U/L   Total Bilirubin 0.5 0.3 - 1.2 mg/dL   GFR calc non Af Amer >60 >60 mL/min   GFR calc Af Amer >60 >60 mL/min    Comment: (NOTE) The eGFR has been calculated using the CKD EPI equation. This calculation has not been validated in all clinical situations. eGFR's persistently <60 mL/min signify possible Chronic Kidney Disease.    Anion gap 5 5 - 15  Type and screen [facility]     Status: None   Collection Time: 04/12/17  7:04 AM  Result Value Ref Range   ABO/RH(D) A POS    Antibody Screen NEG    Sample Expiration 04/15/2017    Unit Number [id number]    Blood Component Type RED CELLS,LR    Unit division 00    Status of Unit ISSUED,FINAL    Transfusion Status OK TO TRANSFUSE    Crossmatch Result Compatible   Prepare RBC     Status: None   Collection Time: 04/12/17  7:04 AM  Result  Value Ref Range   Order Confirmation ORDER PROCESSED BY BLOOD BANK   ABO/Rh     Status: None   Collection Time: 04/12/17  7:04 AM  Result Value Ref Range   ABO/RH(D) A POS   Hemoglobin and hematocrit, blood     Status: Abnormal   Collection Time: 04/12/17  7:58 PM  Result Value Ref Range   Hemoglobin 9.1 (  L) 12.0 - 15.0 g/dL    Comment: POST TRANSFUSION SPECIMEN   HCT 28.5 (L) 36.0 - 46.0 %  CBC     Status: Abnormal   Collection Time: 04/13/17  3:55 AM  Result Value Ref Range   WBC 7.9 4.0 - 10.5 K/uL   RBC 3.34 (L) 3.87 - 5.11 MIL/uL   Hemoglobin 8.7 (L) 12.0 - 15.0 g/dL   HCT 27.0 (L) 36.0 - 46.0 %   MCV 80.8 78.0 - 100.0 fL   MCH 26.0 26.0 - 34.0 pg   MCHC 32.2 30.0 - 36.0 g/dL   RDW 15.7 (H) 11.5 - 15.5 %   Platelets 375 150 - 400 K/uL  Comprehensive metabolic panel     Status: Abnormal   Collection Time: 04/13/17  3:55 AM  Result Value Ref Range   Sodium 139 135 - 145 mmol/L   Potassium 3.8 3.5 - 5.1 mmol/L   Chloride 110 101 - 111 mmol/L   CO2 23 22 - 32 mmol/L   Glucose, Bld 96 65 - 99 mg/dL   BUN 14 6 - 20 mg/dL   Creatinine, Ser 0.62 0.44 - 1.00 mg/dL   Calcium 8.7 (L) 8.9 - 10.3 mg/dL   Total Protein 5.6 (L) 6.5 - 8.1 g/dL   Albumin 2.1 (L) 3.5 - 5.0 g/dL   AST 19 15 - 41 U/L   ALT 10 (L) 14 - 54 U/L   Alkaline Phosphatase 52 38 - 126 U/L   Total Bilirubin 0.6 0.3 - 1.2 mg/dL   GFR calc non Af Amer >60 >60 mL/min   GFR calc Af Amer >60 >60 mL/min    Comment: (NOTE) The eGFR has been calculated using the CKD EPI equation. This calculation has not been validated in all clinical situations. eGFR's persistently <60 mL/min signify possible Chronic Kidney Disease.    Anion gap 6 5 - 15   No results found.     Medical Problem List and Plan: 1.  Decreased functional mobility secondary to bilateral transtibial amputation 04/10/2017  -admit to inpatient rehab 2.  DVT Prophylaxis/Anticoagulation: Subcutaneous heparin. Monitor platelet counts and any signs of  bleeding 3. Pain Management: pt with stump and phantom limb pain  - Neurontin 100 mg 3  times a day, oxycodone and Robaxin as needed  -reviewed massage and sensory feed back to limbs as well.  4. Mood: Wellbutrin 150 mg daily, Remeron 7.5 mg daily at bedtime 5. Neuropsych: This patient is capable of making decisions on her own behalf. 6. Skin/Wound Care: Routine skin checks  -continue vac therapy for 7 days per Dr. Sharol Given.  7. Fluids/Electrolytes/Nutrition: Routine I&O with follow-up chemistries 8. Acute blood loss anemia. Continue iron supplement. Follow-up CBC 9. History of hypertension. Norvasc 5 mg daily 10. History of cocaine abuse. Provide counseling 11.History of MRSA PCR screen positive. Contact precautions 12. Constipation. Laxative assistance   Post Admission Physician Evaluation: 1. Functional deficits secondary  to bilateral below knee amputations. 2. Patient is admitted to receive collaborative, interdisciplinary care between the physiatrist, rehab nursing staff, and therapy team. 3. Patient's level of medical complexity and substantial therapy needs in context of that medical necessity cannot be provided at a lesser intensity of care such as a SNF. 4. Patient has experienced substantial functional loss from his/her baseline which was documented above under the "Functional History" and "Functional Status" headings.  Judging by the patient's diagnosis, physical exam, and functional history, the patient has potential for functional progress which will result in measurable  gains while on inpatient rehab.  These gains will be of substantial and practical use upon discharge  in facilitating mobility and self-care at the household level. 5. Physiatrist will provide 24 hour management of medical needs as well as oversight of the therapy plan/treatment and provide guidance as appropriate regarding the interaction of the two. 6. The Preadmission Screening has been reviewed and patient status  is unchanged unless otherwise stated above. 7. 24 hour rehab nursing will assist with bladder management, bowel management, safety, skin/wound care, disease management, medication administration, pain management and patient education  and help integrate therapy concepts, techniques,education, etc. 8. PT will assess and treat for/with: Lower extremity strength, range of motion, stamina, balance, functional mobility, safety, adaptive techniques and equipment, pre-prosthetic education, w/c assessment (pt has power wheelchair with "dead battery"), community reintegration, family ed.   Goals are: mod I at w/c level except for home entry. 9. OT will assess and treat for/with: ADL's, functional mobility, safety, upper extremity strength, adaptive techniques and equipment.   Goals are: mod I at w/c level. Therapy may proceed with showering this patient. 10. SLP will assess and treat for/with: n/a.  Goals are: n/a. 11. Case Management and Social Worker will assess and treat for psychological issues and discharge planning. 12. Team conference will be held weekly to assess progress toward goals and to determine barriers to discharge. 13. Patient will receive at least 3 hours of therapy per day at least 5 days per week. 14. ELOS: 5-7 days       15. Prognosis:  excellent     Meredith Staggers, MD, [facility] 04/13/2017  Cathlyn Parsons., PA-C 04/13/2017

## 2017-04-13 NOTE — Progress Notes (Signed)
Patient admitting to CIR. CSW signing off.  [name] Locus Bobbyjoe Pabst LCSWA [phone]

## 2017-04-13 NOTE — Progress Notes (Signed)
Pt discharged from [facility] and admitted to CIR. All belongings taken with Pt. All questions answered.

## 2017-04-13 NOTE — Progress Notes (Signed)
Jamse Arn, MD Physician Signed Physical Medicine and Rehabilitation  Consult Note Date of Service: 04/13/2017 5:41 AM  Related encounter: ED to Hosp-Admission (Current) from 04/08/2017 in [facility] All Collapse All   [] Hide copied text [] Hover for attribution information      Physical Medicine and Rehabilitation Consult Reason for Consult: Decreased functional mobility Referring Physician: [facility]   HPI: [name] is a 56 y.o. 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 Per chart review and patient, patient lives in a local boarding house. Independent with electric scooter prior to admission. Her aunt is also a resident of the boarding home that can assist. Presented 04/08/2017 with increasing leg pain as well as foul-smelling ulcerations lower extremities with exposed bone and necrotic tissue. Underwent bilateral transtibial amputation 04/10/2017 per Dr. Sharol Given. Hospital course pain management. Acute blood loss anemia and transfused. Subcutaneous heparin for DVT prophylaxis. Physical therapy evaluation completed with recommendations of physical medicine rehabilitation consult.   Review of Systems  Constitutional: Negative for chills and fever.  HENT: Negative for hearing loss.   Eyes: Negative for blurred vision and double vision.  Respiratory: Positive for shortness of breath. Negative for cough.   Cardiovascular: Negative for chest pain.  Gastrointestinal: Positive for constipation. Negative for nausea and vomiting.  Genitourinary: Negative for dysuria, flank pain and hematuria.  Musculoskeletal: Positive for joint pain and myalgias.  Skin: Negative for rash.  Neurological: Positive for headaches. Negative for seizures.  Psychiatric/Behavioral: Positive for depression.  All  other systems reviewed and are negative.      Past Medical History:  Diagnosis Date  . CAP (community acquired pneumonia) 03/2014 X 2  . Cocaine abuse    ongoing with resultant vaculitis.  . Depression   . Headache    "weekly" (07/29/2016)  . Hypertension   . Inflammatory arthritis   . Migraines    "probably 5-6/yr" (07/29/2016)  . Normocytic anemia    BL Hgb 9.8-12. Last anemia panel 04/2010 - showing Fe 19, ferritin 101.  Pt on monthly B12 injections  . Rheumatoid arthritis(714.0)    patient reported  . 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 Followed by Dr. Louanne Skye        Past Surgical History:  Procedure Laterality Date  . AMPUTATION Left 05/22/2016   Procedure: AMPUTATION LEFT LONG FINGER;  Surgeon: Marybelle Killings, MD;  Location: [facility];  Service: Orthopedics;  Laterality: Left;  . AMPUTATION Bilateral 04/10/2017   Procedure: AMPUTATION BELOW KNEE;  Surgeon: Newt Minion, MD;  Location: [facility];  Service: Orthopedics;  Laterality: Bilateral;  . HERNIA REPAIR     "stomach"  . I&D EXTREMITY Right 09/26/2015   Procedure: IRRIGATION AND DEBRIDEMENT LEG WOUND  VAC PLACEMENT.;  Surgeon: Loel Lofty Dillingham, DO;  Location: [facility];  Service: Plastics;  Laterality: Right;  . INCISION AND DRAINAGE OF WOUND Bilateral 10/20/2016   Procedure: IRRIGATION AND DEBRIDEMENT WOUND BILATERAL;  Surgeon: Edrick Kins, DPM;  Location: [facility];  Service: Podiatry;  Laterality: Bilateral;  . IRRIGATION AND DEBRIDEMENT ABSCESS Bilateral 09/26/2013   Procedure: DEBRIDEMENT ULCERS BILATERAL THIGHS;  Surgeon: Gwenyth Ober, MD;  Location: [facility];  Service: General;  Laterality: Bilateral;  . SKIN BIOPSY Bilateral    shin nodules  Family History  Problem Relation Age of Onset  . Breast cancer Mother        Breast cancer  . Alcohol abuse Mother   . Colon cancer Maternal Aunt 37  . Alcohol abuse Father    Social  History:  reports that she has been smoking Cigarettes.  She has a 4.56 pack-year smoking history. She has never used smokeless tobacco. She reports that she uses drugs, including "Crack" cocaine and Cocaine. She reports that she does not drink alcohol. Allergies:       Allergies  Allergen Reactions  . Acetaminophen Swelling and Other (See Comments)    Reaction:  Eyelid swelling  . Other Other (See Comments)    Pt states she is allergic to a blood pressure medication, but does not know the reaction or medication         Medications Prior to Admission  Medication Sig Dispense Refill  . naproxen sodium (ANAPROX) 220 MG tablet Take 220 mg by mouth 2 (two) times daily with a meal.    . ferrous sulfate 325 (65 FE) MG tablet Take 1 tablet (325 mg total) by mouth 2 (two) times daily with a meal. (Patient not taking: Reported on 03/30/2017) 60 tablet 3  . Olmesartan-Amlodipine-HCTZ (TRIBENZOR) 40-10-12.5 MG TABS Take 1 tablet by mouth daily. (Patient not taking: Reported on 04/08/2017) 30 tablet 0    Home: [place] expects to be discharged to:: Unsure Living Arrangements: Other (Comment) (Boarding house with some of her relatives present) Available Help at Discharge: Family, Friend(s), Available 24 hours/day ("They are around all of the time") Type of Home: House ([place]) Home Access: Stairs to enter Technical brewer of Steps: 3 Entrance Stairs-Rails: Left Home Layout: Two level, Able to live on main level with bedroom/bathroom Home Equipment: Kasandra Knudsen - single point, Environmental consultant - 2 wheels, Wheelchair - manual, Wheelchair - power, Civil engineer, contracting (Battery is dead in Engineer, manufacturing systems)  Functional History: Prior Function Level of Independence: Needs assistance Gait / Transfers Assistance Needed: Used RW or WC for mobility. Reports her friends help her if she needs to get down the steps.  ADL's / Homemaking Assistance Needed: Reports she could bathe and dress  independently.  They prepare their own food at house. Functional Status:  Mobility: Bed Mobility Overal bed mobility: Needs Assistance Bed Mobility: Supine to Sit Supine to sit: Supervision Sit to supine: Supervision General bed mobility comments: pt able to prop self onto elbows and then to hands and scoot to EOB Transfers Overall transfer level: Needs assistance Transfers: Government social research officer transfers: Min assist General transfer comment: posterior transfer into recliner with min A. Pt very strong UE's and once able to get arms of chair could scoot with supervision  ADL:  Cognition: Cognition Overall Cognitive Status: Within Functional Limits for tasks assessed Orientation Level: Oriented X4 Cognition Arousal/Alertness: Awake/alert Behavior During Therapy: WFL for tasks assessed/performed, Flat affect Overall Cognitive Status: Within Functional Limits for tasks assessed  Blood pressure (!) 162/78, pulse (!) 101, temperature 99.2 F (37.3 C), resp. rate 18, height 5' 1"  (1.549 m), weight 47.2 kg (104 lb 0.9 oz), SpO2 97 %. Physical Exam  Vitals reviewed. Constitutional: She is oriented to person, place, and time. She appears well-developed.  56 year old female appearing older than stated age Cachectic  HENT:  Head: Normocephalic and atraumatic.  Eyes: EOM are normal. Right eye exhibits no discharge. Left eye exhibits no discharge.  Neck: Normal range of motion. Neck supple.  Cardiovascular: Normal rate and regular rhythm.  Respiratory: Effort normal and breath sounds normal. No respiratory distress.  GI: Soft. Bowel sounds are normal.  Musculoskeletal: She exhibits tenderness. She exhibits no edema.  Neurological: She is alert and oriented to person, place, and time.  Motor: 5/5 B/l UE, B/l HF (knee extension limited by pain0 Sensation intact to light touch  Skin:  Bilateral transtibial amputation sites are dressed appropriately tender  with wound VAC  Psychiatric: She has a normal mood and affect. Her behavior is normal.    Lab Results Last 24 Hours       Results for orders placed or performed during the hospital encounter of 04/08/17 (from the past 24 hour(s))  Type and screen [place]     Status: None (Preliminary result)   Collection Time: 04/12/17  7:04 AM  Result Value Ref Range   ABO/RH(D) A POS    Antibody Screen NEG    Sample Expiration 04/15/2017    Unit Number [id number]    Blood Component Type RED CELLS,LR    Unit division 00    Status of Unit ISSUED    Transfusion Status OK TO TRANSFUSE    Crossmatch Result Compatible   Prepare RBC     Status: None   Collection Time: 04/12/17  7:04 AM  Result Value Ref Range   Order Confirmation ORDER PROCESSED BY BLOOD BANK   ABO/Rh     Status: None   Collection Time: 04/12/17  7:04 AM  Result Value Ref Range   ABO/RH(D) A POS   Hemoglobin and hematocrit, blood     Status: Abnormal   Collection Time: 04/12/17  7:58 PM  Result Value Ref Range   Hemoglobin 9.1 (L) 12.0 - 15.0 g/dL   HCT 28.5 (L) 36.0 - 46.0 %  CBC     Status: Abnormal   Collection Time: 04/13/17  3:55 AM  Result Value Ref Range   WBC 7.9 4.0 - 10.5 K/uL   RBC 3.34 (L) 3.87 - 5.11 MIL/uL   Hemoglobin 8.7 (L) 12.0 - 15.0 g/dL   HCT 27.0 (L) 36.0 - 46.0 %   MCV 80.8 78.0 - 100.0 fL   MCH 26.0 26.0 - 34.0 pg   MCHC 32.2 30.0 - 36.0 g/dL   RDW 15.7 (H) 11.5 - 15.5 %   Platelets 375 150 - 400 K/uL  Comprehensive metabolic panel     Status: Abnormal   Collection Time: 04/13/17  3:55 AM  Result Value Ref Range   Sodium 139 135 - 145 mmol/L   Potassium 3.8 3.5 - 5.1 mmol/L   Chloride 110 101 - 111 mmol/L   CO2 23 22 - 32 mmol/L   Glucose, Bld 96 65 - 99 mg/dL   BUN 14 6 - 20 mg/dL   Creatinine, Ser 0.62 0.44 - 1.00 mg/dL   Calcium 8.7 (L) 8.9 - 10.3 mg/dL   Total Protein 5.6 (L) 6.5 - 8.1 g/dL   Albumin 2.1 (L) 3.5 -  5.0 g/dL   AST 19 15 - 41 U/L   ALT 10 (L) 14 - 54 U/L   Alkaline Phosphatase 52 38 - 126 U/L   Total Bilirubin 0.6 0.3 - 1.2 mg/dL   GFR calc non Af Amer >60 >60 mL/min   GFR calc Af Amer >60 >60 mL/min   Anion gap 6 5 - 15     Imaging Results (Last 48 hours)  No results found.    Assessment/Plan: Diagnosis: Bilateral BKAs Labs independently reviewed.  Records reviewed and summated  above. Clean amputation daily with soap and water Monitor incision site for signs of infection or impending skin breakdown. Staples to remain in place for 3-4 weeks Stump shrinker, for edema control  Scar mobilization massaging to prevent soft tissue adherence Stump protector during therapies Prevent flexion contractures by implementing the following:              Encourage prone lying for 20-30 mins per day BID to avoid hip flexion       Contractures if medically appropriate;             Avoid pillow under knees when patient is lying in bed in order to prevent both        knee and hip flexion contractures;             Avoid prolonged sitting Post surgical pain control with oral medication Phantom limb pain control with physical modalities including desensitization techniques (gentle self massage to the residual stump,hot packs if sensation intact, [place]) and mirror therapy, TENS. If ineffective, consider pharmacological treatment for neuropathic pain (e.g gabapentin, pregabalin, amytriptalyine, duloxetine).  When using wheelchair, patient should have knee on amputated side fully extended with board under the seat cushion.  1. Does the need for close, 24 hr/day medical supervision in concert with the patient's rehab needs make it unreasonable for this patient to be served in a less intensive setting? Potentially  2. Co-Morbidities requiring supervision/potential complications: cocaine and tobacco abuse (counsel), HTN (monitor and provide prns in accordance with increased physical exertion and pain),  cocaine induced vasculitis, post-op pain (Biofeedback training with therapies to help reduce reliance on opiate pain medications, particularly IV dilaudid, monitor pain control during therapies, and sedation at rest and titrate to maximum efficacy to ensure participation and gains in therapies), Acute blood loss anemia (transfuse if necessary to ensure appropriate perfusion for increased activity tolerance) 3. Due to safety, skin/wound care, disease management, pain management and patient education, does the patient require 24 hr/day rehab nursing? Yes 4. Does the patient require coordinated care of a physician, rehab nurse, PT (1-2 hrs/day, 5 days/week) and OT (1-2 hrs/day, 5 days/week) to address physical and functional deficits in the context of the above medical diagnosis(es)? Yes Addressing deficits in the following areas: balance, endurance, locomotion, strength, transferring, bathing, dressing, toileting and psychosocial support 5. Can the patient actively participate in an intensive therapy program of at least 3 hrs of therapy per day at least 5 days per week? Potentially 6. The potential for patient to make measurable gains while on inpatient rehab is good 7. Anticipated functional outcomes upon discharge from inpatient rehab are Mod I at wheelchair level  with PT, Mod I at wheelchair level with OT, n/a with SLP. 8. Estimated rehab length of stay to reach the above functional goals is: 4-6 days. 9. Anticipated D/C setting: Home 10. Anticipated post D/C treatments: HH therapy and Home excercise program 11. Overall Rehab/Functional Prognosis: good  RECOMMENDATIONS: This patient's condition is appropriate for continued rehabilitative care in the following setting: Pt making good progress with therapeis.  Will cont to follow as pain improves and consider CIR if functional deficits persist. Patient has agreed to participate in recommended program. Potentially Note that insurance prior  authorization may be required for reimbursement for recommended care.  Comment: Rehab Admissions Coordinator to follow up.  Delice Lesch, MD, Mellody Drown Cathlyn Parsons., PA-C 04/13/2017    Revision History  Routing History

## 2017-04-13 NOTE — Progress Notes (Signed)
[facility] Admission Coordinator Signed Physical Medicine and Rehabilitation  PMR Pre-admission Date of Service: 04/13/2017 2:35 PM  Related encounter: ED to Hosp-Admission (Current) from 04/08/2017 in [place]       [] Hide copied text PMR Admission Coordinator Pre-Admission Assessment  Patient: [name] is an 56 y.o., female MRN: [id number] DOB: 08-30-1961 Height: 5' 1"  (154.9 cm) Weight: 47.7 kg (105 lb 1.6 oz)                                                                                                                                                  Insurance Information HMO:     PPO:      PCP:      IPA:      80/20:      OTHER:  PRIMARY: Medicaid [place] Access      Policy#: [id number]      Subscriber: Self CM Name:       Phone#:      Fax#:  Pre-Cert#: coverage code: [place]      Employer: Disabled  Benefits:  Phone #:      Name:  Eff. Date: eligible as of 04/13/17     Deduct:       Out of Pocket Max:       Life Max:  CIR:       SNF:  Outpatient:      Co-Pay:  Home Health:       Co-Pay:  DME:      Co-Pay:  Providers:   Medicaid Application Date:       Case Manager:  Disability Application Date:       Case Worker:   Emergency Contact Information        Contact Information    Name Relation Home Work Mobile   McNair,[name] Brother   [phone]   Selena Batten   [phone]     Current Medical History  Patient Admitting Diagnosis: Bilateral BKAs  History of Present Illness: [name] a 56 y.o.right handed femalewith history of cocaine abuse, hypertension, 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 Prior to admission she was independent with electric scooter in the community and used a walker while in the home. She has an  aunt and a brother who work at the boarding home who can assist as needed.Presented 04/08/2017 with increasing leg pain as well as foul-smelling ulcerations in lower extremities with exposed bone and necrotic tissue. Underwent bilateral transtibial amputation 04/10/2017 per Dr. Sharol Given. Hospital course complicated by pain management. Acute blood loss anemia and transfused. Subcutaneous heparin for DVT prophylaxis. Physical therapy evaluation completed with recommendations of physical medicine rehabilitation consult. Patient was admitted for a  comprehensive rehabilitation program 04/13/17.   Past Medical History      Past Medical History:  Diagnosis Date  . CAP (community acquired pneumonia) 03/2014 X 2  . Cocaine abuse    ongoing with resultant vaculitis.  . Depression   . Headache    "weekly" (07/29/2016)  . Hypertension   . Inflammatory arthritis   . Migraines    "probably 5-6/yr" (07/29/2016)  . Normocytic anemia    BL Hgb 9.8-12. Last anemia panel 04/2010 - showing Fe 19, ferritin 101.  Pt on monthly B12 injections  . Rheumatoid arthritis(714.0)    patient reported  . 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 Followed by Dr. Louanne Skye    Family History  family history includes Alcohol abuse in her father and mother; Breast cancer in her mother; Colon cancer (age of onset: 42) in her maternal aunt.  Prior Rehab/Hospitalizations:  Has the patient had major surgery during 100 days prior to admission? No  Current Medications   Current Facility-Administered Medications:  .  acetaminophen (TYLENOL) tablet 650 mg, 650 mg, Oral, Q6H PRN **OR** acetaminophen (TYLENOL) suppository 650 mg, 650 mg, Rectal, Q6H PRN, Lavina Hamman, MD .  amLODipine (NORVASC) tablet 5 mg, 5 mg, Oral, Daily, Lavina Hamman, MD, 5 mg at 04/13/17 6073 .  bisacodyl (DULCOLAX) suppository 10 mg, 10 mg, Rectal, Daily PRN, Newt Minion, MD .   buPROPion (WELLBUTRIN XL) 24 hr tablet 150 mg, 150 mg, Oral, Daily, Lavina Hamman, MD, 150 mg at 04/13/17 0918 .  docusate sodium (COLACE) capsule 100 mg, 100 mg, Oral, BID, Newt Minion, MD, 100 mg at 04/13/17 0919 .  feeding supplement (BOOST / RESOURCE BREEZE) liquid 1 Container, 1 Container, Oral, TID BM, Charlynne Cousins, MD, 1 Container at 04/13/17 1348 .  ferrous sulfate tablet 325 mg, 325 mg, Oral, BID WC, Lavina Hamman, MD, 325 mg at 04/13/17 0917 .  gabapentin (NEURONTIN) capsule 100 mg, 100 mg, Oral, TID, Lavina Hamman, MD, 100 mg at 04/13/17 0918 .  heparin injection 5,000 Units, 5,000 Units, Subcutaneous, Q8H, Lavina Hamman, MD, 5,000 Units at 04/13/17 1348 .  hydrALAZINE (APRESOLINE) injection 10 mg, 10 mg, Intravenous, Q4H PRN, Lavina Hamman, MD, 10 mg at 04/12/17 1842 .  HYDROcodone-acetaminophen (NORCO/VICODIN) 5-325 MG per tablet 1-2 tablet, 1-2 tablet, Oral, Q4H PRN, Lavina Hamman, MD, 2 tablet at 04/13/17 1418 .  hydrOXYzine (ATARAX/VISTARIL) tablet 25 mg, 25 mg, Oral, TID PRN, Lavina Hamman, MD .  magnesium citrate solution 1 Bottle, 1 Bottle, Oral, Once PRN, Newt Minion, MD .  methocarbamol (ROBAXIN) tablet 500 mg, 500 mg, Oral, Q6H PRN **OR** methocarbamol (ROBAXIN) 500 mg in dextrose 5 % 50 mL IVPB, 500 mg, Intravenous, Q6H PRN, Newt Minion, MD .  metoCLOPramide (REGLAN) tablet 5-10 mg, 5-10 mg, Oral, Q8H PRN **OR** metoCLOPramide (REGLAN) injection 5-10 mg, 5-10 mg, Intravenous, Q8H PRN, Newt Minion, MD .  mirtazapine (REMERON) tablet 7.5 mg, 7.5 mg, Oral, QHS, Jonnalagadda, Arbutus Ped, MD, 7.5 mg at 04/12/17 2126 .  multivitamin with minerals tablet 1 tablet, 1 tablet, Oral, Daily, Charlynne Cousins, MD, 1 tablet at 04/13/17 0919 .  ondansetron (ZOFRAN) tablet 4 mg, 4 mg, Oral, Q6H PRN **OR** ondansetron (ZOFRAN) injection 4 mg, 4 mg, Intravenous, Q6H PRN, Newt Minion, MD .  ondansetron (ZOFRAN) tablet 4 mg, 4 mg, Oral, Q6H PRN **OR**  [DISCONTINUED] ondansetron (ZOFRAN) injection  4 mg, 4 mg, Intravenous, Q6H PRN, Lavina Hamman, MD .  oxyCODONE (Oxy IR/ROXICODONE) immediate release tablet 5-10 mg, 5-10 mg, Oral, Q3H PRN, Newt Minion, MD, 10 mg at 04/11/17 2122 .  polyethylene glycol (MIRALAX / GLYCOLAX) packet 17 g, 17 g, Oral, Daily PRN, Newt Minion, MD, 17 g at 04/11/17 1741 .  silver sulfADIAZINE (SILVADENE) 1 % cream, , Topical, Daily, Lavina Hamman, MD, 1 application at 10/93/23 5573  Patients Current Diet: Diet regular Room service appropriate? Yes; Fluid consistency: Thin Diet - low sodium heart healthy Diet - low sodium heart healthy  Precautions / Restrictions Precautions Precautions: Fall Restrictions Weight Bearing Restrictions: No Other Position/Activity Restrictions: Keep operative extremities elevated. wound vacs B LE's   Has the patient had 2 or more falls or a fall with injury in the past year?No  Prior Activity Level Community (5-7x/wk): Prior to admission patient was out in the community daily with her motorized wheelchair and was independent in the house with a rolling walker.    Home Assistive Devices / Equipment Home Assistive Devices/Equipment: Wheelchair, Environmental consultant (specify type) ([place]) Home Equipment: Kasandra Knudsen - single point, Environmental consultant - 2 wheels, Wheelchair - manual, Wheelchair - power, Civil engineer, contracting (Battery is dead in Engineer, manufacturing systems)  Prior Device Use: Indicate devices/aids used by the patient prior to current illness, exacerbation or injury? Motorized wheelchair or scooter and Radio producer Level Prior Function Level of Independence: Independent with assistive device(s) Gait / Transfers Assistance Needed: Used RW or WC for mobility. Reports her friends help her if she needs to get down the steps.  ADL's / Homemaking Assistance Needed: Reports she could bathe and dress independently.  They prepare their own food at house.  Self Care: Did the patient  need help bathing, dressing, using the toilet or eating?  Independent  Indoor Mobility: Did the patient need assistance with walking from room to room (with or without device)? Independent  Stairs: Did the patient need assistance with internal or external stairs (with or without device)? Needed some help  Functional Cognition: Did the patient need help planning regular tasks such as shopping or remembering to take medications? Independent  Current Functional Level Cognition  Overall Cognitive Status: Within Functional Limits for tasks assessed Orientation Level: Oriented X4    Extremity Assessment (includes Sensation/Coordination)  Upper Extremity Assessment: Overall WFL for tasks assessed  Lower Extremity Assessment: RLE deficits/detail, LLE deficits/detail RLE Deficits / Details: New BKA, strength at least 3/5 RLE: Unable to fully assess due to pain LLE Deficits / Details: New BKA, strength at least 3/5 LLE: Unable to fully assess due to pain    ADLs       Mobility  Overal bed mobility: Needs Assistance Bed Mobility: Supine to Sit, Sit to Supine Supine to sit: Supervision Sit to supine: Supervision General bed mobility comments: Pt able to come to long sitting position in bed and then return to supine    Transfers  Overall transfer level: Needs assistance Transfers: Government social research officer transfers: Min assist General transfer comment: Pt had just returned to bed from chair so performed anterior and posterior scooting in bed. Min assist for balance. Pt able to scoot posteriorly and anteriorly 2-3'    Ambulation / Gait / Stairs / Office manager / Balance Dynamic Sitting Balance Sitting balance - Comments: stable static sitting. Occasional minor loss of balance with dynamic activities Balance Overall balance assessment: Needs assistance Sitting-balance support: No  upper extremity supported Sitting balance-Leahy  Scale: Fair Sitting balance - Comments: stable static sitting. Occasional minor loss of balance with dynamic activities Postural control: Posterior lean    Special needs/care consideration BiPAP/CPAP: No CPM: No Continuous Drip IV: No Dialysis: No         Life Vest: No Oxygen: No Special Bed: No Trach Size: No Wound Vac (area): Yes bilateral lower extremities, surgical incisions         Skin: Dry, Abrasions to bilateral upper extremities, Moisture associated break down to bottom         Bowel mgmt:Continent, last BM 04/12/17 Bladder mgmt: Incontinent with external foley in place; PTA continent during the day and wore "pull ups" at night due to incontinence from urgency  Diabetic mgmt: No     Previous Home Environment Living Arrangements: Other (Comment) (Boarding house with some of her relatives present) Available Help at Discharge: Family, Friend(s), Available 24 hours/day ("They are around all of the time") Type of Home: House (Boarding house) Home Layout: Two level, Able to live on main level with bedroom/bathroom Home Access: Stairs to enter Entrance Stairs-Rails: Left Entrance Stairs-Number of Steps: 3 [place]: No  Discharge Living Setting Plans for Discharge Living Setting: Other (Comment) (Lives in a boarding house with family and friends ) Type of Home at Discharge: Other (Comment) (Boarding house that is managed by her brother [name] ) Discharge Home Layout: Two level, Able to live on main level with bedroom/bathroom Discharge Home Access: Stairs to enter (notified brother of need for a ramp) Entrance Stairs-Number of Steps: 2, landing, 1 Discharge Bathroom Shower/Tub: Tub/shower unit Discharge Bathroom Toilet: Standard Discharge Bathroom Accessibility: Yes How Accessible: Accessible via wheelchair (patient with narrow hips, brother to measure door frames) Does the patient have any problems obtaining your medications?: No  Social/Family/Support  Systems Patient Roles: Other (Comment) (Sibling and friend) Contact Information: Brother: Ailene Ards [phone] Anticipated Caregiver: Brother, aunt, and friends  Anticipated Caregiver's Contact Information: see above  Ability/Limitations of Caregiver: None Caregiver Availability: 24/7 Discharge Plan Discussed with Primary Caregiver: Yes Is Caregiver In Agreement with Plan?: Yes Does Caregiver/Family have Issues with Lodging/Transportation while Pt is in Rehab?: No  Goals/Additional Needs Patient/Family Goal for Rehab: PT/OT Mod I wheelchair level  Expected length of stay: 4-6 days  Cultural Considerations: None Dietary Needs: Regular textures and thin liquids  Equipment Needs: TBD Special Service Needs: Patient reports having a motorized wheelchair, but needs a battery  Additional Information: Brother to build ramp and measure doors Pt/Family Agrees to Admission and willing to participate: Yes Program Orientation Provided & Reviewed with Pt/Caregiver Including Roles  & Responsibilities: Yes Additional Information Needs: See above regarding access barriers and pt. requests a new battery for her motorized wheelchair  Information Needs to be Provided By: Team   Decrease burden of Care through IP rehab admission: No  Possible need for SNF placement upon discharge: No  Patient Condition: This patient's medical and functional status has changed since the consult dated 04/13/17 in which the Rehabilitation Physician determined and documented that the patient was potentially appropriate for intensive rehabilitative care in an inpatient rehabilitation facility. Issues have been addressed and update has been discussed with Dr. Naaman Plummer and patient now appropriate for inpatient rehabilitation. Will admit to inpatient rehab today.   Preadmission Screen Completed By:  Gunnar Fusi, 04/13/2017 2:35 PM ______________________________________________________________________   Discussed status  with Dr. Naaman Plummer on 04/13/17 at 1445 and received telephone approval for admission today.  Admission Coordinator:  Gunnar Fusi, time 1445/Date 04/13/17       Cosigned by: Meredith Staggers, MD at 04/13/2017 3:21 PM  Revision History

## 2017-04-13 NOTE — PMR Pre-admission (Signed)
PMR Admission Coordinator Pre-Admission Assessment  Patient: [name] is an 56 y.o., female MRN: [id number] DOB: Oct 01, 1961 Height: 5' 1"  (154.9 cm) Weight: 47.7 kg (105 lb 1.6 oz)              Insurance Information HMO:     PPO:      PCP:      IPA:      80/20:      OTHER:  PRIMARY: Medicaid [place] [facility]      Policy#: [id number]      Subscriber: Self CM Name:       Phone#:      Fax#:  Pre-Cert#: coverage code: [id number]      Employer: Disabled  Benefits:  Phone #:      Name:  Eff. Date: eligible as of 04/13/17     Deduct:       Out of Pocket Max:       Life Max:  CIR:       SNF:  Outpatient:      Co-Pay:  Home Health:       Co-Pay:  DME:      Co-Pay:  Providers:   Medicaid Application Date:       Case Manager:  Disability Application Date:       Case Worker:   Emergency Contact Information Contact Information    Name Relation Home Work Mobile   McNair,[name] Brother   [phone]   Selena Batten   [phone]     Current Medical History  Patient Admitting Diagnosis: Bilateral BKAs  History of Present Illness: [name] a 56 y.o.right handed femalewith history of cocaine abuse, hypertension, 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 Prior to admission she was independent with electric scooter in the community and used a walker while in the home. She has an aunt and a brother who work at the boarding home who can assist as needed.Presented 04/08/2017 with increasing leg pain as well as foul-smelling ulcerations in lower extremities with exposed bone and necrotic tissue. Underwent bilateral transtibial amputation 04/10/2017 per Dr. Sharol Given. Hospital course complicated by pain management. Acute blood loss anemia and transfused. Subcutaneous heparin for DVT prophylaxis. Physical therapy  evaluation completed with recommendations of physical medicine rehabilitation consult. Patient was admitted for a comprehensive rehabilitation program 04/13/17.       Past Medical History  Past Medical History:  Diagnosis Date  . CAP (community acquired pneumonia) 03/2014 X 2  . Cocaine abuse    ongoing with resultant vaculitis.  . Depression   . Headache    "weekly" (07/29/2016)  . Hypertension   . Inflammatory arthritis   . Migraines    "probably 5-6/yr" (07/29/2016)  . Normocytic anemia    BL Hgb 9.8-12. Last anemia panel 04/2010 - showing Fe 19, ferritin 101.  Pt on monthly B12 injections  . Rheumatoid arthritis(714.0)    patient reported  . 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 Followed by Dr. Louanne Skye    Family History  family history includes Alcohol abuse in her father and mother; Breast cancer in her mother; Colon cancer (age of onset: 19) in her maternal aunt.  Prior Rehab/Hospitalizations:  Has the patient had major surgery during 100 days prior to admission? No  Current Medications   Current Facility-Administered  Medications:  .  acetaminophen (TYLENOL) tablet 650 mg, 650 mg, Oral, Q6H PRN **OR** acetaminophen (TYLENOL) suppository 650 mg, 650 mg, Rectal, Q6H PRN, Lavina Hamman, MD .  amLODipine (NORVASC) tablet 5 mg, 5 mg, Oral, Daily, Lavina Hamman, MD, 5 mg at 04/13/17 2595 .  bisacodyl (DULCOLAX) suppository 10 mg, 10 mg, Rectal, Daily PRN, Newt Minion, MD .  buPROPion (WELLBUTRIN XL) 24 hr tablet 150 mg, 150 mg, Oral, Daily, Lavina Hamman, MD, 150 mg at 04/13/17 0918 .  docusate sodium (COLACE) capsule 100 mg, 100 mg, Oral, BID, Newt Minion, MD, 100 mg at 04/13/17 0919 .  feeding supplement (BOOST / RESOURCE BREEZE) liquid 1 Container, 1 Container, Oral, TID BM, Charlynne Cousins, MD, 1 Container at 04/13/17 1348 .  ferrous sulfate tablet 325 mg, 325 mg, Oral, BID WC, Lavina Hamman, MD, 325  mg at 04/13/17 0917 .  gabapentin (NEURONTIN) capsule 100 mg, 100 mg, Oral, TID, Lavina Hamman, MD, 100 mg at 04/13/17 0918 .  heparin injection 5,000 Units, 5,000 Units, Subcutaneous, Q8H, Lavina Hamman, MD, 5,000 Units at 04/13/17 1348 .  hydrALAZINE (APRESOLINE) injection 10 mg, 10 mg, Intravenous, Q4H PRN, Lavina Hamman, MD, 10 mg at 04/12/17 1842 .  HYDROcodone-acetaminophen (NORCO/VICODIN) 5-325 MG per tablet 1-2 tablet, 1-2 tablet, Oral, Q4H PRN, Lavina Hamman, MD, 2 tablet at 04/13/17 1418 .  hydrOXYzine (ATARAX/VISTARIL) tablet 25 mg, 25 mg, Oral, TID PRN, Lavina Hamman, MD .  magnesium citrate solution 1 Bottle, 1 Bottle, Oral, Once PRN, Newt Minion, MD .  methocarbamol (ROBAXIN) tablet 500 mg, 500 mg, Oral, Q6H PRN **OR** methocarbamol (ROBAXIN) 500 mg in dextrose 5 % 50 mL IVPB, 500 mg, Intravenous, Q6H PRN, Newt Minion, MD .  metoCLOPramide (REGLAN) tablet 5-10 mg, 5-10 mg, Oral, Q8H PRN **OR** metoCLOPramide (REGLAN) injection 5-10 mg, 5-10 mg, Intravenous, Q8H PRN, Newt Minion, MD .  mirtazapine (REMERON) tablet 7.5 mg, 7.5 mg, Oral, QHS, Jonnalagadda, Arbutus Ped, MD, 7.5 mg at 04/12/17 2126 .  multivitamin with minerals tablet 1 tablet, 1 tablet, Oral, Daily, Charlynne Cousins, MD, 1 tablet at 04/13/17 0919 .  ondansetron (ZOFRAN) tablet 4 mg, 4 mg, Oral, Q6H PRN **OR** ondansetron (ZOFRAN) injection 4 mg, 4 mg, Intravenous, Q6H PRN, Newt Minion, MD .  ondansetron (ZOFRAN) tablet 4 mg, 4 mg, Oral, Q6H PRN **OR** [DISCONTINUED] ondansetron (ZOFRAN) injection 4 mg, 4 mg, Intravenous, Q6H PRN, Lavina Hamman, MD .  oxyCODONE (Oxy IR/ROXICODONE) immediate release tablet 5-10 mg, 5-10 mg, Oral, Q3H PRN, Newt Minion, MD, 10 mg at 04/11/17 2122 .  polyethylene glycol (MIRALAX / GLYCOLAX) packet 17 g, 17 g, Oral, Daily PRN, Newt Minion, MD, 17 g at 04/11/17 1741 .  silver sulfADIAZINE (SILVADENE) 1 % cream, , Topical, Daily, Lavina Hamman, MD, 1 application at  63/87/56 4332  Patients Current Diet: Diet regular Room service appropriate? Yes; Fluid consistency: Thin Diet - low sodium heart healthy Diet - low sodium heart healthy  Precautions / Restrictions Precautions Precautions: Fall Restrictions Weight Bearing Restrictions: No Other Position/Activity Restrictions: Keep operative extremities elevated. wound vacs B LE's   Has the patient had 2 or more falls or a fall with injury in the past year?No  Prior Activity Level Community (5-7x/wk): Prior to admission patient was out in the community daily with her motorized wheelchair and was independent in the house with a rolling walker.    Home Assistive Devices /  Equipment Home Assistive Devices/Equipment: Wheelchair, Environmental consultant (specify type) ([place]) Home Equipment: Kasandra Knudsen - single point, Environmental consultant - 2 wheels, Wheelchair - manual, Wheelchair - power, Civil engineer, contracting (Battery is dead in Engineer, manufacturing systems)  Prior Device Use: Indicate devices/aids used by the patient prior to current illness, exacerbation or injury? Motorized wheelchair or scooter and Radio producer Level Prior Function Level of Independence: Independent with assistive device(s) Gait / Transfers Assistance Needed: Used RW or WC for mobility. Reports her friends help her if she needs to get down the steps.  ADL's / Homemaking Assistance Needed: Reports she could bathe and dress independently.  They prepare their own food at house.  Self Care: Did the patient need help bathing, dressing, using the toilet or eating?  Independent  Indoor Mobility: Did the patient need assistance with walking from room to room (with or without device)? Independent  Stairs: Did the patient need assistance with internal or external stairs (with or without device)? Needed some help  Functional Cognition: Did the patient need help planning regular tasks such as shopping or remembering to take medications? Independent  Current Functional  Level Cognition  Overall Cognitive Status: Within Functional Limits for tasks assessed Orientation Level: Oriented X4    Extremity Assessment (includes Sensation/Coordination)  Upper Extremity Assessment: Overall WFL for tasks assessed  Lower Extremity Assessment: RLE deficits/detail, LLE deficits/detail RLE Deficits / Details: New BKA, strength at least 3/5 RLE: Unable to fully assess due to pain LLE Deficits / Details: New BKA, strength at least 3/5 LLE: Unable to fully assess due to pain    ADLs       Mobility  Overal bed mobility: Needs Assistance Bed Mobility: Supine to Sit, Sit to Supine Supine to sit: Supervision Sit to supine: Supervision General bed mobility comments: Pt able to come to long sitting position in bed and then return to supine    Transfers  Overall transfer level: Needs assistance Transfers: Government social research officer transfers: Min assist General transfer comment: Pt had just returned to bed from chair so performed anterior and posterior scooting in bed. Min assist for balance. Pt able to scoot posteriorly and anteriorly 2-3'    Ambulation / Gait / Stairs / Office manager / Balance Dynamic Sitting Balance Sitting balance - Comments: stable static sitting. Occasional minor loss of balance with dynamic activities Balance Overall balance assessment: Needs assistance Sitting-balance support: No upper extremity supported Sitting balance-Leahy Scale: Fair Sitting balance - Comments: stable static sitting. Occasional minor loss of balance with dynamic activities Postural control: Posterior lean    Special needs/care consideration BiPAP/CPAP: No CPM: No Continuous Drip IV: No Dialysis: No         Life Vest: No Oxygen: No Special Bed: No Trach Size: No Wound Vac (area): Yes bilateral lower extremities, surgical incisions         Skin: Dry, Abrasions to bilateral upper extremities, Moisture associated break down  to bottom         Bowel mgmt:Continent, last BM 04/12/17 Bladder mgmt: Incontinent with external foley in place; PTA continent during the day and wore "pull ups" at night due to incontinence from urgency  Diabetic mgmt: No     Previous Home Environment Living Arrangements: Other (Comment) (Boarding house with some of her relatives present) Available Help at Discharge: Family, Friend(s), Available 24 hours/day ("They are around all of the time") Type of Home: House (Boarding house) Home Layout: Two level, Able to live on main  level with bedroom/bathroom Home Access: Stairs to enter Entrance Stairs-Rails: Left Entrance Stairs-Number of Steps: 3 Home Care Services: No  Discharge Living Setting Plans for Discharge Living Setting: Other (Comment) (Lives in a boarding house with family and friends ) Type of Home at Discharge: Other (Comment) (Boarding house that is managed by her brother [name] ) Discharge Home Layout: Two level, Able to live on main level with bedroom/bathroom Discharge Home Access: Stairs to enter (notified brother of need for a ramp) Entrance Stairs-Number of Steps: 2, landing, 1 Discharge Bathroom Shower/Tub: Tub/shower unit Discharge Bathroom Toilet: Standard Discharge Bathroom Accessibility: Yes How Accessible: Accessible via wheelchair (patient with narrow hips, brother to measure door frames) Does the patient have any problems obtaining your medications?: No  Social/Family/Support Systems Patient Roles: Other (Comment) (Sibling and friend) Contact Information: Brother: Ailene Ards [phone] Anticipated Caregiver: Brother, aunt, and friends  Anticipated Caregiver's Contact Information: see above  Ability/Limitations of Caregiver: None Caregiver Availability: 24/7 Discharge Plan Discussed with Primary Caregiver: Yes Is Caregiver In Agreement with Plan?: Yes Does Caregiver/Family have Issues with Lodging/Transportation while Pt is in Rehab?:  No  Goals/Additional Needs Patient/Family Goal for Rehab: PT/OT Mod I wheelchair level  Expected length of stay: 4-6 days  Cultural Considerations: None Dietary Needs: Regular textures and thin liquids  Equipment Needs: TBD Special Service Needs: Patient reports having a motorized wheelchair, but needs a battery  Additional Information: Brother to build ramp and measure doors Pt/Family Agrees to Admission and willing to participate: Yes Program Orientation Provided & Reviewed with Pt/Caregiver Including Roles  & Responsibilities: Yes Additional Information Needs: See above regarding access barriers and pt. requests a new battery for her motorized wheelchair  Information Needs to be Provided By: Team   Decrease burden of Care through IP rehab admission: No  Possible need for SNF placement upon discharge: No  Patient Condition: This patient's medical and functional status has changed since the consult dated 04/13/17 in which the Rehabilitation Physician determined and documented that the patient was potentially appropriate for intensive rehabilitative care in an inpatient rehabilitation facility. Issues have been addressed and update has been discussed with Dr. Naaman Plummer and patient now appropriate for inpatient rehabilitation. Will admit to inpatient rehab today.   Preadmission Screen Completed By:  Gunnar Fusi, 04/13/2017 2:35 PM ______________________________________________________________________   Discussed status with Dr. Naaman Plummer on 04/13/17 at 1445 and received telephone approval for admission today.  Admission Coordinator:  Gunnar Fusi, time 1445/Date 04/13/17

## 2017-04-13 NOTE — Progress Notes (Signed)
Inpatient Rehabilitation  Met with patient to discuss team's recommendation for IP Rehab.  Shared booklets and answered questions.  Patient is eager to return home as independent as possible and has good family/friend support.  Plan to follow up with brother to clarify that the home will need to be wheelchair accessible.  Note that patient has been transitioned to oral pain meds.  I await therapy recommendations from today and rehab medical clearance prior to potential admission today.  Plan to update the team as I know.  Please call with questions.   Carmelia Roller., CCC/SLP Admission Coordinator  Post  Cell [phone]

## 2017-04-13 NOTE — Evaluation (Signed)
Occupational Therapy Evaluation Patient Details Name: [name] MRN: [id number] DOB: 1961-08-12 Today's Date: 04/13/2017    History of Present Illness [name] is an 56 y.o. 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  Patient with gangrenous changes, and is now s/p Bilateral BKA's with VAC placement on 04/10/17.   Clinical Impression   Pt reports independence with RW or W/C for basic ADL and functional mobility and has assistance with cooking and cleaning tasks. She currently requires min guard to mod assist overall for ADL tasks. Pt was unable to complete toilet transfer this session due to significant pain but is highly motivated to participate with therapy and return to independence. She would benefit from CIR level therapies in order maximize independence and safety with ADL and functional mobility. OT will continue to follow acutely.    Follow Up Recommendations  CIR;Supervision/Assistance - 24 hour    Equipment Recommendations  Other (comment) (TBD at next venue of care)    Recommendations for Other Services Rehab consult     Precautions / Restrictions Precautions Precautions: Fall Restrictions Weight Bearing Restrictions: No Other Position/Activity Restrictions: Keep operative extremities elevated. wound vacs B LE's      Mobility Bed Mobility Overal bed mobility: Needs Assistance Bed Mobility: Supine to Sit;Sit to Supine     Supine to sit: Supervision Sit to supine: Supervision   General bed mobility comments: Pt able to come to long sitting position in bed. Pain limiting further mobility.   Transfers Overall transfer level: Needs assistance   Transfers: Comptroller transfers: Min assist   General transfer comment: Pt with significant  pain and declining transfer to chair. She does report being up earlier in the day and completing UB bathing tasks with set-up.    Balance Overall balance assessment: Needs assistance Sitting-balance support: No upper extremity supported Sitting balance-Leahy Scale: Fair Sitting balance - Comments: stable static sitting. Occasional minor loss of balance with dynamic activities                                   ADL either performed or assessed with clinical judgement   ADL Overall ADL's : Needs assistance/impaired Eating/Feeding: Set up;Bed level   Grooming: Set up;Bed level   Upper Body Bathing: Min guard;Sitting   Lower Body Bathing: Moderate assistance;Sitting/lateral leans   Upper Body Dressing : Min guard;Sitting   Lower Body Dressing: Moderate assistance;Sitting/lateral leans                 General ADL Comments: Pt limited in participation due to significant pain this session. Able to sit in long sitting position in bed to participate in ADL. However, declined further mobility.      Vision Patient Visual Report: No change from baseline Vision Assessment?: No apparent visual deficits     Perception     Praxis      Pertinent Vitals/Pain Pain Assessment: 0-10 Pain Score: 10-Worst pain ever Faces Pain Scale: Hurts whole lot Pain Location: Bil residual limbs Pain Descriptors / Indicators: Aching;Grimacing;Sore Pain Intervention(s): Limited activity within patient's tolerance;Monitored during session;Repositioned     Hand Dominance Right   Extremity/Trunk Assessment Upper Extremity Assessment Upper Extremity Assessment: Overall WFL for tasks assessed   Lower Extremity Assessment  Lower Extremity Assessment: Defer to PT evaluation   Cervical / Trunk Assessment Cervical / Trunk Assessment: Normal   Communication Communication Communication: No difficulties   Cognition Arousal/Alertness: Awake/alert Behavior During Therapy: WFL for tasks  assessed/performed;Flat affect Overall Cognitive Status: Within Functional Limits for tasks assessed                                 General Comments: Pt understandably emotional concerning pain and current situation.    General Comments       Exercises Exercises: Amputee Amputee Exercises Quad Sets: AROM;Both;10 reps;Supine Hip Extension: AROM;Both;10 reps;Sidelying Hip Flexion/Marching: AROM;Both;10 reps;Supine Knee Flexion: AROM;Both;10 reps;Supine Knee Extension: AROM;Both;10 reps;Supine Straight Leg Raises: AROM;Both;10 reps;Supine Chair Push Up: AROM;Seated;5 reps   Shoulder Instructions      Home Living Family/patient expects to be discharged to:: Unsure Living Arrangements: Other (Comment) (lives in a boarding house) Available Help at Discharge: Family;Friend(s);Available 24 hours/day Type of Home: House ([place]) Home Access: Stairs to enter [employer] of Steps: 3 Entrance Stairs-Rails: Left Home Layout: Two level;Able to live on main level with bedroom/bathroom               Home Equipment: Kasandra Knudsen - single point;Walker - 2 wheels;Wheelchair - Education officer, community - power;Shower seat (battery is dead in wheelchair/scooter per PT)          Prior Functioning/Environment Level of Independence: Independent with assistive device(s)  Gait / Transfers Assistance Needed: Used RW or WC for mobility. Reports her friends help her if she needs to get down the steps.  ADL's / Homemaking Assistance Needed: Reports she could bathe and dress independently.  Has assistance with cooking and cleaning.             OT Problem List: Decreased strength;Decreased range of motion;Decreased activity tolerance;Impaired balance (sitting and/or standing);Decreased safety awareness;Decreased knowledge of use of DME or AE;Decreased knowledge of precautions;Pain      OT Treatment/Interventions: Self-care/ADL training;Therapeutic exercise;Energy  conservation;DME and/or AE instruction;Therapeutic activities;Patient/family education;Balance training    OT Goals(Current goals can be found in the care plan section) Acute Rehab OT Goals Patient Stated Goal: To go home OT Goal Formulation: With patient Time For Goal Achievement: 04/27/17 Potential to Achieve Goals: Good ADL Goals Pt Will Perform Upper Body Dressing: with modified independence;sitting Pt Will Perform Lower Body Dressing: with modified independence;sitting/lateral leans Pt Will Transfer to Toilet: with modified independence;with transfer board;bedside commode Pt Will Perform Toileting - Clothing Manipulation and hygiene: with modified independence;sitting/lateral leans Pt Will Perform Tub/Shower Transfer: Tub transfer;3 in 1;with transfer board  OT Frequency: Min 3X/week   Barriers to D/C:            Co-evaluation              AM-PAC PT "6 Clicks" Daily Activity     Outcome Measure Help from another person eating meals?: None Help from another person taking care of personal grooming?: A Little Help from another person toileting, which includes using toliet, bedpan, or urinal?: A Little Help from another person bathing (including washing, rinsing, drying)?: A Lot Help from another person to put on and taking off regular upper body clothing?: None Help from another person to put on and taking off regular lower body clothing?: A Lot 6 Click Score: 18   End of Session Equipment Utilized During Treatment:  (B wound vacs) Nurse Communication: Mobility status  Activity Tolerance: Patient tolerated treatment well Patient left:  in bed;with call bell/phone within reach;with bed alarm set  OT Visit Diagnosis: Muscle weakness (generalized) (M62.81);Pain Pain - Right/Left:  (bilateral) Pain - part of body: Knee (residual limbs)                Time: 7530-1040 OT Time Calculation (min): 11 min Charges:  OT General Charges $OT Visit: 1 Procedure OT Evaluation $OT  Eval Moderate Complexity: 1 Procedure G-Codes:     Norman Herrlich, MS OTR/L  Pager: [place] A [name] 04/13/2017, 5:08 PM

## 2017-04-13 NOTE — Discharge Summary (Signed)
Physician Discharge Summary  RAYVEN RETTIG BSW:[id number] DOB: 07-Sep-1961 DOA: 04/08/2017  PCP: Patient, No Pcp Per  Admit date: 04/08/2017 Discharge date: 04/13/2017  Admitted From:(Home) Disposition:(IP rehab ) . Recommendations for Outpatient Follow-up:  1. Follow up with PCP in 2  weeks    Home Health:( YES) .  Discharge Condition: (Stable) CODE STATUS: (FULL) Diet recommendation: Heart Healthy   Brief/Interim Summary:  AMANDEEP HOGSTON is an 56 y.o. 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 She was taken to the OR 04/10/17 for bilateral below the knee amputation ,we stopped the IV antibiotics ,  Continue pain control and wound care at the inpatient rehab .    2-Chronic pain management: Avoid any IV drugs as possible .  3-Probably substance abuse/Cocaine use disorder, severe, dependence ([place]) :  With active cocaine use, psychiatry was consulted and recommended to start Wellbutrin.    4-Elevated hypertension :  Likely due to cocaine, agree with Norvasc will use clonidine when necessary.  5-Acute kidney injury :  Acute prerenal in etiology, improved with IVF .    Discharge  Instructions  Discharge Instructions    Diet - low sodium heart healthy    Complete by:  As directed    Increase activity slowly    Complete by:  As directed      Allergies as of 04/13/2017      Reactions   Acetaminophen Swelling, Other (See Comments)   Reaction:  Eyelid swelling   Other Other (See Comments)   Pt states she is allergic to a blood pressure medication, but does not know the reaction or medication      Medication List    STOP taking these medications   naproxen sodium 220 MG tablet Commonly known as:  ANAPROX   Olmesartan-Amlodipine-HCTZ 40-10-12.5 MG Tabs Commonly known as:  TRIBENZOR     TAKE these medications   ferrous sulfate 325 (65 FE) MG tablet Take 1 tablet (325 mg total) by mouth 2 (two) times daily with a meal.   gabapentin 100 MG capsule Commonly known as:  NEURONTIN Take 1 capsule (100 mg total) by mouth 3 (three) times daily.   HYDROcodone-acetaminophen 5-325 MG tablet Commonly known as:  NORCO/VICODIN Take 1-2 tablets by mouth every 4 (four) hours as needed for moderate pain.   magnesium citrate Soln Take 296 mLs (1 Bottle total) by mouth once as needed for severe constipation.   oxyCODONE 5 MG immediate release tablet Commonly known as:  Oxy IR/ROXICODONE Take 1-2 tablets (5-10 mg total) by mouth every 3 (three) hours as needed for breakthrough pain.   polyethylene glycol packet Commonly known as:  MIRALAX / GLYCOLAX Take 17 g by mouth daily as needed  for mild constipation.      Follow-up Information    [facility] Follow up.   Specialty:  Internal Medicine Why:  appointment scheduled for 04/21/2017 at 1030 am, please call if you are unable to make appt.  Contact information: [place]       Newt Minion, MD In 1 week.   Specialty:  Orthopedic Surgery Contact information: [address] [place] [address] [phone]           Allergies  Allergen Reactions  . Acetaminophen Swelling and Other (See Comments)    Reaction:  Eyelid swelling  . Other Other (See Comments)    Pt states she is allergic to a blood pressure medication, but does not know the reaction or medication    Consultations:  Ortho-surgery .   Procedures/Studies: Dg Chest 2 View  Result Date: 04/08/2017 CLINICAL DATA:  Infection EXAM: CHEST  2 VIEW COMPARISON:  Chest radiograph 10/08/2016 FINDINGS: Normal cardiomediastinal contours. No pulmonary edema or focal consolidation. No pleural effusion or pneumothorax. There are bilateral round opacities overlying the mid thorax, which may be nipple shadows. IMPRESSION: 1. No active cardiopulmonary disease. 2. Bilateral round opacities of the mid chest, likely nipple shadows. Repeat radiograph with nipple markers would be confirmatory. Electronically Signed   By: Ulyses Jarred M.D.   On: 04/08/2017 06:48   Dg Tibia/fibula Left  Result Date: 04/08/2017 CLINICAL DATA:  Open wounds at the left leg, with severe pain. Initial encounter. EXAM: LEFT TIBIA AND FIBULA - 2 VIEW COMPARISON:  Left tibia/fibula radiographs performed 03/30/2017 FINDINGS: There is no evidence of fracture or dislocation. The tibia and fibula appear grossly intact. The ankle mortise is unremarkable in appearance. The knee joint is unremarkable. No knee joint effusion is identified. A large soft tissue wound is noted overlying the expected location of the left Achilles tendon. No definite osseous erosions are seen at this time. IMPRESSION: 1. No evidence of fracture or dislocation. No definite osseous erosions seen. 2. Large soft tissue wound overlying the expected location of the Achilles tendon. Electronically Signed   By: Garald Balding M.D.   On: 04/08/2017 06:49   Dg Tibia/fibula Left  Result Date: 03/30/2017 CLINICAL DATA:  Pt has hx of ulcer with gangrene in March and had surgery at [facility]. C/o bilateral lower leg and foot pain, EXAM:  LEFT TIBIA AND FIBULA - 2 VIEW COMPARISON:  03/22/2017 FINDINGS: No fracture.  No bone lesion. There is no bone resorption to suggest osteomyelitis. Knee and ankle joints are normally spaced and aligned. No arthropathic change. There is a soft tissue ulcer that is most evident posteriorly, involving the distal leg and ankle. The normal Achilles tendon shadows non avid suggesting a disrupted Achilles tendon. There are several bubbles of soft tissue air projecting over the wound. IMPRESSION: 1. No fracture or bone lesion. 2. No evidence of osteomyelitis. Electronically Signed   By: Lajean Manes M.D.   On: 03/30/2017 10:44   Dg Tibia/fibula Left  Result Date: 03/22/2017 CLINICAL DATA:  Bilateral open and infected appearing leg wounds EXAM: LEFT TIBIA AND FIBULA - 2 VIEW COMPARISON:  Left tibia and fibula of Feb 06, 2017 FINDINGS: The bones are subjectively adequately mineralized. There is no lytic or blastic lesion or periosteal reaction. There are deep ulcers noted posteriorly and laterally over the distal leg. The observed portions of the knee and ankle are normal. IMPRESSION: Soft tissue ulceration a similar  to that seen previously. No radiographic evidence of acute osteomyelitis. As mentioned on the previous study, MRI would be more sensitive for detection of early changes of osteomyelitis. Electronically Signed   By: David  Martinique M.D.   On: 03/22/2017 07:07   Dg Tibia/fibula Right  Result Date: 04/08/2017 CLINICAL DATA:  Open wounds at the right lower leg, with severe pain. Initial encounter. EXAM: RIGHT TIBIA AND FIBULA - 2 VIEW COMPARISON:  Right tibia/fibula radiographs performed 03/30/2017 FINDINGS: There is no evidence of fracture or dislocation. The tibia and fibula appear intact. The ankle mortise is incompletely assessed, but appears grossly unremarkable. No knee joint effusion is identified. Prominent soft tissue wounds are noted along the posterior lower leg. No definite osseous erosions are  seen. IMPRESSION: 1. No evidence of fracture or dislocation. No definite osseous erosion seen. 2. Prominent soft tissue wounds along the posterior lower leg. Electronically Signed   By: Garald Balding M.D.   On: 04/08/2017 06:50   Dg Tibia/fibula Right  Result Date: 03/30/2017 CLINICAL DATA:  Pt has hx of ulcer with gangrene in March and had surgery at [facility]. C/o bilateral lower leg and foot pain, EXAM: RIGHT TIBIA AND FIBULA - 2 VIEW COMPARISON:  03/22/2017 FINDINGS: No fracture.  No bone lesion. No bone resorption is seen to suggest osteomyelitis. The knee and ankle joints are normally spaced and aligned. There is a soft tissue wound is most apparent posteriorly, involving the distal leg and posterior ankle. This may disrupts the Achilles tendon. IMPRESSION: 1. No fracture, bone lesion or evidence of osteomyelitis. Electronically Signed   By: Lajean Manes M.D.   On: 03/30/2017 10:46   Dg Tibia/fibula Right  Result Date: 03/22/2017 CLINICAL DATA:  Bilateral open leg wounds. EXAM: RIGHT TIBIA AND FIBULA - 2 VIEW COMPARISON:  Right tibia and fibula of Feb 06, 2017 FINDINGS: The bones are subjectively adequately mineralized. Suspected osteolysis involving the proximal aspect of the medial malleolus seen previously is not evident today. There is subtle periosteal reaction along the posterior aspect of the distal tibia. The ankle joint mortise is preserved. The knee exhibits no acute abnormality. There are deep soft tissue wounds over the posterior 1/3 of the leg. IMPRESSION: Subtle periosteal reaction along the posterior aspect of the lower third of the diaphysis of the fibula. The medial malleolar changes seen on the previous study are not evident today. Deep wounds over the posterior soft tissues of the leg. Electronically Signed   By: David  Martinique M.D.   On: 03/22/2017 07:10   Dg Foot Complete Left  Result Date: 03/30/2017 CLINICAL DATA:  Pt has hx of ulcer with gangrene in March and had surgery at [facility].  C/o bilateral lower leg and foot pain. EXAM: LEFT FOOT - COMPLETE 3+ VIEW COMPARISON:  02/06/2017 and 12/22/2016 and 09/09/2016. FINDINGS: No fracture.  No bone lesion. There are periarticular erosions at the PIP joint of the second toe, new since 09/09/2016. This could reflect the sequelae of a septic arthropathy. It may be due to an inflammatory arthropathy. There is no current associated soft tissue swelling. There are no other areas of bone resorption. There is no convincing osteomyelitis. The other joints are normally spaced and aligned. There is a soft tissue wound over the dorsal aspect of the distal leg and ankle. Several bubbles of soft tissue air are noted. IMPRESSION: 1. No convincing osteomyelitis. 2. Erosive changes of the PIP joint of the second toe are likely chronic, but are new since the study dated  09/09/2016 consistent with either the sequelae of an inflammatory arthropathy or septic arthritis. 3. Soft tissue ulcer over the dorsal aspect of the lower leg and ankle. Electronically Signed   By: Lajean Manes M.D.   On: 03/30/2017 10:43   Dg Foot Complete Right  Result Date: 03/30/2017 CLINICAL DATA:  Pt has hx of ulcer with gangrene in March and had surgery at [facility]. C/o bilateral lower leg and foot pain, EXAM: RIGHT FOOT COMPLETE - 3+ VIEW COMPARISON:  Right foot MRI, 02/06/2017 FINDINGS: There is marked joint space narrowing with subchondral erosions and mild ordering sclerosis at the first metatarsophalangeal joint. The proximal phalanx of the great toe has subluxed laterally in relation to the first metatarsal head. These findings are similar to the prior MRI. The remaining joints are normally spaced and aligned. No other arthropathic change. No soft tissue swelling is evident. There is a small focus of subcutaneous air adjacent to the medial first metatarsal head. IMPRESSION: 1. Advanced arthropathic changes of the first metatarsophalangeal joint, which may reflect the sequelae of a septic  arthropathy or inflammatory arthropathy. 2. Small focus of subcutaneous air adjacent to the medial first metatarsal head. 3. No other abnormalities. Electronically Signed   By: Lajean Manes M.D.   On: 03/30/2017 11:13    (Echo, Carotid, EGD, Colonoscopy, ERCP)    Subjective:   Discharge Exam: Vitals:   04/13/17 0618 04/13/17 1354  BP: (!) 149/84 (!) 169/79  Pulse: 86 95  Resp: 18 18  Temp: 98.8 F (37.1 C) 98.3 F (36.8 C)   Vitals:   04/12/17 1830 04/12/17 2121 04/13/17 0618 04/13/17 1354  BP: (!) 179/87 (!) 162/78 (!) 149/84 (!) 169/79  Pulse: 97 (!) 101 86 95  Resp: 18 18 18 18   Temp: 98.6 F (37 C) 99.2 F (37.3 C) 98.8 F (37.1 C) 98.3 F (36.8 C)  TempSrc: Oral     SpO2: 100% 97% 100% 100%  Weight:   47.7 kg (105 lb 1.6 oz)   Height:        General: Pt is alert, awake, not in acute distress Cardiovascular: RRR, S1/S2 +, no rubs, no gallops Respiratory: CTA bilaterally, no wheezing, no rhonchi Abdominal: Soft, NT, ND, bowel sounds + Extremities: no edema, no cyanosis    The results of significant diagnostics from this hospitalization (including imaging, microbiology, ancillary and laboratory) are listed below for reference.     Microbiology: Recent Results (from the past 240 hour(s))  Blood culture (routine x 2)     Status: None   Collection Time: 04/08/17  6:09 AM  Result Value Ref Range Status   Specimen Description BLOOD RIGHT ARM  Final   Special Requests IN PEDIATRIC BOTTLE Blood Culture adequate volume  Final   Culture   Final    NO GROWTH 5 DAYS Performed at [facility], [address], [place], [place] [address]    Report Status 04/13/2017 FINAL  Final  Blood culture (routine x 2)     Status: None   Collection Time: 04/08/17  6:10 AM  Result Value Ref Range Status   Specimen Description BLOOD LEFT FOREARM  Final   Special Requests IN PEDIATRIC BOTTLE Blood Culture adequate volume  Final   Culture   Final    NO GROWTH 5  DAYS Performed at [facility], [place] [address] [place], [place] [address]    Report Status 04/13/2017 FINAL  Final  Surgical pcr screen     Status: None  Collection Time: 04/09/17  8:57 PM  Result Value Ref Range Status   MRSA, PCR NEGATIVE NEGATIVE Final   Staphylococcus aureus NEGATIVE NEGATIVE Final    Comment:        The Xpert SA Assay (FDA approved for NASAL specimens in patients over 33 years of age), is one component of a comprehensive surveillance program.  Test performance has been validated by [facility] for patients greater than or equal to 62 year old. It is not intended to diagnose infection nor to guide or monitor treatment.      Labs: BNP (last 3 results) No results for input(s): BNP in the last 8760 hours. Basic Metabolic Panel:  Recent Labs Lab 04/09/17 0510 04/10/17 0254 04/11/17 0538 04/12/17 0534 04/13/17 0355  NA 141 140 137 136 139  K 4.0 3.7 4.1 4.0 3.8  CL 112* 112* 111 110 110  CO2 21* 21* 21* 21* 23  GLUCOSE 104* 101* 143* 102* 96  BUN 20 17 14 13 14   CREATININE 1.18* 0.91 0.87 0.74 0.62  CALCIUM 8.6* 8.7* 8.7* 8.3* 8.7*   Liver Function Tests:  Recent Labs Lab 04/11/17 0538 04/12/17 0534 04/13/17 0355  AST 19 19 19   ALT 10* 10* 10*  ALKPHOS 52 48 52  BILITOT 0.4 0.5 0.6  PROT 5.9* 5.3* 5.6*  ALBUMIN 2.3* 2.1* 2.1*   No results for input(s): LIPASE, AMYLASE in the last 168 hours. No results for input(s): AMMONIA in the last 168 hours. CBC:  Recent Labs Lab 04/08/17 0549  04/09/17 0510 04/10/17 0254 04/11/17 0538 04/12/17 0534 04/12/17 1958 04/13/17 0355  WBC 10.1  --  7.3 6.3 8.7 6.5  --  7.9  NEUTROABS 6.7  --   --   --   --   --   --   --   HGB 8.9*  < > 8.9* 8.1* 7.8* 6.8* 9.1* 8.7*  HCT 26.9*  < > 28.0* 26.1* 25.7* 21.8* 28.5* 27.0*  MCV 78.7  --  78.4 80.6 82.6 81.0  --  80.8  PLT 513*  --  441* 450* 396 330  --  375  < > = values in this interval not displayed. Cardiac Enzymes:  Recent Labs Lab  04/08/17 1006  CKTOTAL 77   BNP: Invalid input(s): POCBNP CBG: No results for input(s): GLUCAP in the last 168 hours. D-Dimer No results for input(s): DDIMER in the last 72 hours. Hgb A1c No results for input(s): HGBA1C in the last 72 hours. Lipid Profile No results for input(s): CHOL, HDL, LDLCALC, TRIG, CHOLHDL, LDLDIRECT in the last 72 hours. Thyroid function studies No results for input(s): TSH, T4TOTAL, T3FREE, THYROIDAB in the last 72 hours.  Invalid input(s): FREET3 Anemia work up No results for input(s): VITAMINB12, FOLATE, FERRITIN, TIBC, IRON, RETICCTPCT in the last 72 hours. Urinalysis    Component Value Date/Time   COLORURINE AMBER (A) 04/08/2017 0956   APPEARANCEUR TURBID (A) 04/08/2017 0956   LABSPEC 1.020 04/08/2017 0956   PHURINE 5.0 04/08/2017 0956   GLUCOSEU 50 (A) 04/08/2017 0956   HGBUR NEGATIVE 04/08/2017 0956   BILIRUBINUR NEGATIVE 04/08/2017 0956   BILIRUBINUR small 04/29/2015 1533   KETONESUR NEGATIVE 04/08/2017 0956   PROTEINUR 100 (A) 04/08/2017 0956   UROBILINOGEN 1.0 06/14/2015 2234   NITRITE NEGATIVE 04/08/2017 0956   LEUKOCYTESUR NEGATIVE 04/08/2017 0956   Sepsis Labs Invalid input(s): PROCALCITONIN,  WBC,  LACTICIDVEN Microbiology Recent Results (from the past 240 hour(s))  Blood culture (routine x 2)     Status:  None   Collection Time: 04/08/17  6:09 AM  Result Value Ref Range Status   Specimen Description BLOOD RIGHT ARM  Final   Special Requests IN PEDIATRIC BOTTLE Blood Culture adequate volume  Final   Culture   Final    NO GROWTH 5 DAYS Performed at [facility], [place] [address] [place], [place] [address]    Report Status 04/13/2017 FINAL  Final  Blood culture (routine x 2)     Status: None   Collection Time: 04/08/17  6:10 AM  Result Value Ref Range Status   Specimen Description BLOOD LEFT FOREARM  Final   Special Requests IN PEDIATRIC BOTTLE Blood Culture adequate volume  Final   Culture   Final    NO GROWTH 5  DAYS Performed at [facility], [place] [address], [place], [place] [address]    Report Status 04/13/2017 FINAL  Final  Surgical pcr screen     Status: None   Collection Time: 04/09/17  8:57 PM  Result Value Ref Range Status   MRSA, PCR NEGATIVE NEGATIVE Final   Staphylococcus aureus NEGATIVE NEGATIVE Final    Comment:        The Xpert SA Assay (FDA approved for NASAL specimens in patients over 88 years of age), is one component of a comprehensive surveillance program.  Test performance has been validated by [facility] for patients greater than or equal to 79 year old. It is not intended to diagnose infection nor to guide or monitor treatment.      Time coordinating discharge: Over 30 minutes  SIGNED:   Waldron Session, MD  [facility] 04/13/2017, 2:10 PM Pager   If 7PM-7AM, please contact night-coverage [email or web address] Password [email or web address]

## 2017-04-13 NOTE — Progress Notes (Signed)
Physical Therapy Treatment Patient Details Name: [name] MRN: [id number] DOB: 1961-07-21 Today's Date: 04/13/2017    History of Present Illness [name] is an 56 y.o. 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  Patient with gangrenous changes, and is now s/p Bilateral BKA's with VAC placement on 04/10/17.    PT Comments    Pt continues to make good progress. Pt reports pain in BLE's but is able to fully participate in PT. Expect with CIR pt will reach modified independent at w/c level prior to return home.  Doubt pt will be eligible to receive any HHPT due to insurer appears to be Medicaid.  Follow Up Recommendations  CIR     Equipment Recommendations  Other (comment) (drop arm)    Recommendations for Other Services Rehab consult     Precautions / Restrictions Precautions Precautions: Fall Restrictions Weight Bearing Restrictions: No Other Position/Activity Restrictions: Keep operative extremities elevated. wound vacs B LE's    Mobility  Bed Mobility Overal bed mobility: Needs Assistance Bed Mobility: Supine to Sit;Sit to Supine     Supine to sit: Supervision Sit to supine: Supervision   General bed mobility comments: Pt able to come to long sitting position in bed and then return to supine  Transfers Overall transfer level: Needs assistance   Transfers: Anterior-Posterior Transfer       Anterior-Posterior transfers: Min assist   General transfer comment: Pt had just returned to bed from chair so performed anterior and posterior scooting in bed. Min assist for balance. Pt able to scoot posteriorly and anteriorly 2-3'  Ambulation/Gait                 Stairs            Wheelchair Mobility    Modified Rankin (Stroke Patients Only)       Balance Overall  balance assessment: Needs assistance Sitting-balance support: No upper extremity supported Sitting balance-Leahy Scale: Fair Sitting balance - Comments: stable static sitting. Occasional minor loss of balance with dynamic activities                                    Cognition Arousal/Alertness: Awake/alert Behavior During Therapy: WFL for tasks assessed/performed;Flat affect Overall Cognitive Status: Within Functional Limits for tasks assessed                                        Exercises Amputee Exercises Quad Sets: AROM;Both;10 reps;Supine Hip Extension: AROM;Both;10 reps;Sidelying Hip Flexion/Marching: AROM;Both;10 reps;Supine Knee Flexion: AROM;Both;10 reps;Supine Knee Extension: AROM;Both;10 reps;Supine Straight Leg Raises: AROM;Both;10 reps;Supine Chair Push Up: AROM;Seated;5 reps    General Comments        Pertinent Vitals/Pain Pain Assessment: 0-10 Faces Pain Scale: Hurts whole lot Pain Location: Bil residual limbs Pain Descriptors / Indicators: Aching;Grimacing;Sore Pain Intervention(s): Monitored during session    Home Living                      Prior Function            PT Goals (current goals can now be found in the care plan section) Progress towards PT goals: Progressing toward goals  Frequency    Min 3X/week      PT Plan Current plan remains appropriate    Co-evaluation              AM-PAC PT "6 Clicks" Daily Activity  Outcome Measure  Difficulty turning over in bed (including adjusting bedclothes, sheets and blankets)?: None Difficulty moving from lying on back to sitting on the side of the bed? : None Difficulty sitting down on and standing up from a chair with arms (e.g., wheelchair, bedside commode, etc,.)?: Total Help needed moving to and from a bed to chair (including a wheelchair)?: A Little Help needed walking in hospital room?: Total Help needed climbing 3-5 steps with a  railing? : Total 6 Click Score: 14    End of Session   Activity Tolerance: Patient tolerated treatment well Patient left: with call bell/phone within reach;in bed;with bed alarm set   PT Visit Diagnosis: Pain;Other abnormalities of gait and mobility (R26.89) Pain - Right/Left:  (Both) Pain - part of body: Leg     Time: 9969-2493 PT Time Calculation (min) (ACUTE ONLY): 13 min  Charges:  $Therapeutic Activity: 8-22 mins                    G Codes:       [facility] PT [place] 04/13/2017, 2:03 PM

## 2017-04-13 NOTE — Progress Notes (Signed)
Inpatient Rehabilitation  IP Rehab medical director, Dr. Naaman Plummer has examined patient and is able to mange her current pain in conjunction with the initiation of IP Rehab therapy program.  Have requested that Dr. Burnis Medin discharge patient to IP Rehab today as we have a bed available to offer patient.  Updated Angela RNCM.    Carmelia Roller., CCC/SLP Admission Coordinator  [place]  Cell [phone]

## 2017-04-14 ENCOUNTER — Inpatient Hospital Stay (HOSPITAL_COMMUNITY): Payer: Self-pay | Admitting: Physical Therapy

## 2017-04-14 ENCOUNTER — Inpatient Hospital Stay (HOSPITAL_COMMUNITY): Payer: Self-pay | Admitting: Occupational Therapy

## 2017-04-14 ENCOUNTER — Inpatient Hospital Stay (HOSPITAL_COMMUNITY): Payer: Medicaid Other | Admitting: Occupational Therapy

## 2017-04-14 ENCOUNTER — Encounter (HOSPITAL_COMMUNITY): Payer: Self-pay

## 2017-04-14 DIAGNOSIS — G546 Phantom limb syndrome with pain: Secondary | ICD-10-CM

## 2017-04-14 DIAGNOSIS — D62 Acute posthemorrhagic anemia: Secondary | ICD-10-CM

## 2017-04-14 DIAGNOSIS — I10 Essential (primary) hypertension: Secondary | ICD-10-CM

## 2017-04-14 DIAGNOSIS — G8918 Other acute postprocedural pain: Secondary | ICD-10-CM

## 2017-04-14 LAB — COMPREHENSIVE METABOLIC PANEL WITH GFR
ALT: 16 U/L (ref 14–54)
AST: 24 U/L (ref 15–41)
Albumin: 2.3 g/dL — ABNORMAL LOW (ref 3.5–5.0)
Alkaline Phosphatase: 52 U/L (ref 38–126)
Anion gap: 10 (ref 5–15)
BUN: 11 mg/dL (ref 6–20)
CO2: 23 mmol/L (ref 22–32)
Calcium: 9 mg/dL (ref 8.9–10.3)
Chloride: 107 mmol/L (ref 101–111)
Creatinine, Ser: 0.67 mg/dL (ref 0.44–1.00)
GFR calc Af Amer: >60 mL/min
GFR calc non Af Amer: >60 mL/min
Glucose, Bld: 99 mg/dL (ref 65–99)
Potassium: 3.9 mmol/L (ref 3.5–5.1)
Sodium: 140 mmol/L (ref 135–145)
Total Bilirubin: 0.6 mg/dL (ref 0.3–1.2)
Total Protein: 6.1 g/dL — ABNORMAL LOW (ref 6.5–8.1)

## 2017-04-14 LAB — CBC WITH DIFFERENTIAL/PLATELET
Basophils Absolute: 0 K/uL (ref 0.0–0.1)
Basophils Relative: 0 %
Eosinophils Absolute: 0.2 K/uL (ref 0.0–0.7)
Eosinophils Relative: 3 %
HCT: 29.6 % — ABNORMAL LOW (ref 36.0–46.0)
Hemoglobin: 9.4 g/dL — ABNORMAL LOW (ref 12.0–15.0)
Lymphocytes Relative: 28 %
Lymphs Abs: 1.7 K/uL (ref 0.7–4.0)
MCH: 26.2 pg (ref 26.0–34.0)
MCHC: 31.8 g/dL (ref 30.0–36.0)
MCV: 82.5 fL (ref 78.0–100.0)
Monocytes Absolute: 0.3 K/uL (ref 0.1–1.0)
Monocytes Relative: 5 %
Neutro Abs: 3.8 K/uL (ref 1.7–7.7)
Neutrophils Relative %: 64 %
Platelets: 400 K/uL (ref 150–400)
RBC: 3.59 MIL/uL — ABNORMAL LOW (ref 3.87–5.11)
RDW: 16.4 % — ABNORMAL HIGH (ref 11.5–15.5)
WBC: 6 K/uL (ref 4.0–10.5)

## 2017-04-14 MED ORDER — PRO-STAT SUGAR FREE PO LIQD
30.0000 mL | Freq: Two times a day (BID) | ORAL | Status: DC
  Administered 2017-04-14 – 2017-04-17 (×7): 30 mL via ORAL
  Filled 2017-04-14 (×6): qty 30

## 2017-04-14 NOTE — Progress Notes (Signed)
Occupational Therapy Session Note  Patient Details  Name: [name] MRN: [id number] Date of Birth: Jan 18, 1961  Today's Date: 04/14/2017 OT Individual Time: 2840-6986 OT Individual Time Calculation (min): 70 min    Short Term Goals: Week 1:     Skilled Therapeutic Interventions/Progress Updates:    Pt seen this session to practice transfers to a BSC.  Obtained a BSC and placed adjacent to the bed. Pt was able to complete A/P transfer on and off BSC with touching A. She used commode and was able to cleanse self and manage clothing.  Transitioned to the bed. Obtained B amputee pads for w/c. She used a Warehouse manager to [employer] with steadying A. Pillow placed on top of pads to elevate legs slightly.  Pt became very emotional, crying heavily. Spent quite a bit of time counseling pt and pt talked at great length about her past and her life challenges.  Pt opted to rest in w/c until after lunch.     Therapy Documentation Precautions:  Restrictions Weight Bearing Restrictions: Yes    Vital Signs: Therapy Vitals BP: (!) 163/83 Pain: Pain Assessment Pain Assessment: 0-10 Pain Score: 4  Pain Type: Surgical pain Pain Location: Leg Pain Orientation: Right;Left Pain Descriptors / Indicators: Aching Pain Frequency: Constant Pain Onset: On-going Patients Stated Pain Goal: 4 Pain Intervention(s): Medication (See eMAR) ADL:   See Function Navigator for Current Functional Status.   Therapy/Group: Individual Therapy  [place] 04/14/2017, 11:58 AM

## 2017-04-14 NOTE — Progress Notes (Signed)
[facility] PHYSICAL MEDICINE & REHABILITATION     PROGRESS NOTE  Subjective/Complaints:  Pt seen laying in bed this Am.  She had a difficult time falling asleep last night, but is ready to begin therapies.   ROS: Denies CP, SOB, N/V/D.  Objective: Vital Signs: Blood pressure (!) 168/88, pulse 79, temperature 97.7 F (36.5 C), temperature source Oral, resp. rate 18, height 5' 1"  (1.549 m), weight 39.3 kg (86 lb 11.2 oz), SpO2 100 %. No results found.  Recent Labs  04/12/17 0534 04/12/17 1958 04/13/17 0355  WBC 6.5  --  7.9  HGB 6.8* 9.1* 8.7*  HCT 21.8* 28.5* 27.0*  PLT 330  --  375    Recent Labs  04/12/17 0534 04/13/17 0355  NA 136 139  K 4.0 3.8  CL 110 110  GLUCOSE 102* 96  BUN 13 14  CREATININE 0.74 0.62  CALCIUM 8.3* 8.7*   CBG (last 3)  No results for input(s): GLUCAP in the last 72 hours.  Wt Readings from Last 3 Encounters:  04/14/17 39.3 kg (86 lb 11.2 oz)  04/13/17 47.7 kg (105 lb 1.6 oz)  03/30/17 35.6 kg (78 lb 6.4 oz)    Physical Exam:  BP (!) 168/88 (BP Location: Left Arm)   Pulse 79   Temp 97.7 F (36.5 C) (Oral)   Resp 18   Ht 5' 1"  (1.549 m)   Wt 39.3 kg (86 lb 11.2 oz)   SpO2 100%   BMI 16.38 kg/m  Gen: NAD. Vital signs reviewed.  HENT: Normocephalic. Atraumatic.  Eyes: EOM are normal. No discharge.  Cardiovascular: Normal rate, regular rhythm. No JVD. Respiratory: Breath sounds normal. No respiratory distress.  GI: Soft. Bowel sounds are normal.  Musculoskeletal: She exhibits tenderness. She exhibits no edema.  Neurological: She is alert and oriented.  Motor: 5/5 Bilateral UE prox to distal.  B/l LE: HF 4/5. KE 3-/5 (pain inhibition).  Skin. Bilateral transtibial amputation sites are dressed with wound VACs in place  Psychiatric: She has a normal mood and affect. Her behavior is normal. Thought content normal.   Assessment/Plan: 1. Functional deficits secondary to bilateral transtibial amputation which require 3+ hours per day  of interdisciplinary therapy in a comprehensive inpatient rehab setting. Physiatrist is providing close team supervision and 24 hour management of active medical problems listed below. Physiatrist and rehab team continue to assess barriers to discharge/monitor patient progress toward functional and medical goals.  Function:  Bathing Bathing position      Bathing parts      Bathing assist        Upper Body Dressing/Undressing Upper body dressing                    Upper body assist        Lower Body Dressing/Undressing Lower body dressing                                  Lower body assist        Toileting Toileting          Toileting assist     Transfers Chair/bed transfer             Locomotion Ambulation           Wheelchair          Cognition Comprehension Comprehension assist level: Follows complex conversation/direction with no assist  Expression Expression assist level: Expresses  complex ideas: With no assist  Social Interaction Social Interaction assist level: Interacts appropriately with others - No medications needed.  Problem Solving Problem solving assist level: Solves complex problems: Recognizes & self-corrects  Memory Memory assist level: Complete Independence: No helper    Medical Problem List and Plan:  1. Decreased functional mobility secondary to bilateral transtibial amputation 04/10/2017   Begin CIR 2. DVT Prophylaxis/Anticoagulation: Subcutaneous heparin. Monitor platelet counts and any signs of bleeding  3. Pain Management: pt with stump and phantom limb pain   Neurontin 100 mg 3 times a day, oxycodone and Robaxin as needed  4. Mood: Wellbutrin 150 mg daily, Remeron 7.5 mg daily at bedtime  5. Neuropsych: This patient is capable of making decisions on her own behalf.  6. Skin/Wound Care: Routine skin checks   continue vac therapy ~7/14 7. Fluids/Electrolytes/Nutrition: Routine I&Os  BMP WNL on 7/11 8.  Acute blood loss anemia. Continue iron supplement.  Hb 9.4 on 7/11  Cont to monitor 9. Hypertension.   Norvasc 5 mg daily   Monitor with increased mobility 10. History of cocaine abuse. Provide counseling  11.History of MRSA PCR screen positive. Contact precautions  12. Constipation. Laxative assistance  13. Hypoalbuminemia  Supplement initiated 7/11  LOS (Days) 1 A FACE TO FACE EVALUATION WAS PERFORMED  Ankit Lorie Phenix 04/14/2017 8:29 AM

## 2017-04-14 NOTE — Evaluation (Signed)
Occupational Therapy Assessment and Plan  Patient Details  Name: [name] MRN: [id number] Date of Birth: [birth year]  OT Diagnosis: acute pain and decreased functional mobility status Rehab Potential: Rehab Potential (ACUTE ONLY): Good ELOS: 4-6 days    Today's Date: 04/14/2017 OT Individual Time: 0820-0920 OT Individual Time Calculation (min): 60 min     Problem List:  Patient Active Problem List   Diagnosis Date Noted  . Phantom limb pain ([place])   . S/P bilateral below knee amputation ([place]) 04/13/2017  . Tobacco abuse   . Benign essential HTN   . Post-operative pain   . Acute blood loss anemia   . 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 Atherosclerosis of native arteries of extremities with gangrene, right leg ([place])   . Wound infection 03/30/2017  . AKI (acute kidney injury) ([place]) 02/07/2017  . Acute kidney injury ([place]) 02/06/2017  . Wound healing, delayed   . Osteomyelitis ([place])   . Ulcer with gangrene, with necrosis of muscle ([place])   . Skin ulcer of lower leg with necrosis of muscle, unspecified laterality ([place]) 06/04/2015  . Protein-calorie malnutrition, severe ([place]) 01/15/2015  . MDD (major depressive disorder), recurrent episode, severe ([place]) 10/16/2014  . Cocaine use disorder, severe, dependence ([place]) 10/10/2014  . 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 Essential hypertension 02/26/2010    Past Medical History:  Past Medical History:  Diagnosis Date  . CAP (community acquired pneumonia) 03/2014 X 2  . Cocaine abuse    ongoing with resultant vaculitis.  . Depression   . Headache    "weekly" (07/29/2016)  . Hypertension   . Inflammatory arthritis   . Migraines    "probably 5-6/yr" (07/29/2016)  . Normocytic anemia    BL Hgb 9.8-12. Last anemia panel 04/2010 - showing Fe 19, ferritin 101.  Pt on monthly B12 injections  . Rheumatoid arthritis(714.0)    patient reported  . VASCULITIS 04/17/2010   2/2  levimasole toxicity vs autoimmune d/o   ;  2/2 Levimasole toxicity. Followed by Dr. Louanne Skye   Past Surgical History:  Past Surgical History:  Procedure Laterality Date  . AMPUTATION Left 05/22/2016   Procedure: AMPUTATION LEFT LONG FINGER;  Surgeon: Marybelle Killings, MD;  Location: [facility];  Service: Orthopedics;  Laterality: Left;  . AMPUTATION Bilateral 04/10/2017   Procedure: AMPUTATION BELOW KNEE;  Surgeon: Newt Minion, MD;  Location: [facility];  Service: Orthopedics;  Laterality: Bilateral;  . HERNIA REPAIR     "stomach"  . I&D EXTREMITY Right 09/26/2015   Procedure: IRRIGATION AND DEBRIDEMENT LEG WOUND  VAC PLACEMENT.;  Surgeon: Loel Lofty Dillingham, DO;  Location: [facility];  Service: Plastics;  Laterality: Right;  . INCISION AND DRAINAGE OF WOUND Bilateral 10/20/2016   Procedure: IRRIGATION AND DEBRIDEMENT WOUND BILATERAL;  Surgeon: Edrick Kins, DPM;  Location: [facility];  Service: Podiatry;  Laterality: Bilateral;  . IRRIGATION AND DEBRIDEMENT ABSCESS Bilateral 09/26/2013   Procedure: DEBRIDEMENT ULCERS BILATERAL THIGHS;  Surgeon: Gwenyth Ober, MD;  Location: [facility];  Service: General;  Laterality: Bilateral;  . SKIN BIOPSY Bilateral    shin nodules    Assessment & Plan Clinical Impression: Patient is a 56 y.o. 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 Per chart review and patient, patient lives in a local boarding house. Independent with electric scooter prior to admission  In the community and used a walker while in the home. She has an aunt and a brother who work at the boarding home who can assist . Presented 04/08/2017 with increasing leg pain as well as foul-smelling ulcerations lower extremities with exposed bone and necrotic tissue. Underwent bilateral transtibial amputation 04/10/2017 per Dr. Sharol Given. Hospital course pain management. Acute blood loss  anemia and transfused. Subcutaneous heparin for DVT prophylaxis. Physical therapy evaluation completed with recommendations of physical medicine rehabilitation consult.Patient was admitted for a comprehensive rehabilitation program.   Patient transferred to CIR on 04/13/2017 .    Patient currently requires min with basic self-care skills secondary to pain and decreased balance strategies.  Prior to hospitalization, patient could complete ADLs and functional mobility with modified independent  using motor w/c and RW.  Patient will benefit from skilled intervention to decrease level of assist with basic self-care skills and increase independence with basic self-care skills prior to discharge home with care partner (family).  Anticipate patient will require intermittent supervision and no further OT follow recommended.  OT - End of Session Activity Tolerance: Tolerates 30+ min activity with multiple rests Endurance Deficit: Yes OT Assessment Rehab Potential (ACUTE ONLY): Good OT Patient demonstrates impairments in the following area(s): Endurance;Pain;Safety OT Basic ADL's Functional Problem(s): Grooming;Bathing;Dressing;Toileting OT Transfers Functional Problem(s): Toilet;Tub/Shower OT Plan OT Intensity: Minimum of 1-2 x/day, 45 to 90 minutes OT Frequency: 5 out of 7 days OT Duration/Estimated Length of Stay: 4-6 days  OT Treatment/Interventions: Balance/vestibular training;Discharge planning;Pain management;Self Care/advanced ADL retraining;Therapeutic Activities;UE/LE Coordination activities;Functional mobility training;Patient/family education;Therapeutic Exercise;Community reintegration;DME/adaptive equipment instruction;UE/LE Strength taining/ROM;Wheelchair propulsion/positioning OT Self Feeding Anticipated Outcome(s): mod independent OT Basic Self-Care Anticipated Outcome(s): mod independent OT Toileting Anticipated Outcome(s): mod independent OT Bathroom Transfers Anticipated Outcome(s):  mod independent OT Recommendation Patient destination: Home Follow Up Recommendations: Other (comment) (TBD) Equipment Recommended: To be determined Equipment Details: anticipate Pt will need BSC (droparm?) and tub bench    Skilled Therapeutic Intervention OT eval completed with discussion on OT role, CIR process, and POC.  OT treatment completed with focus on ADL selfcare retraining, functional mobility transfers, and activity tolerance. Pt completed bathing and dressing ADLs seated EOB (Pt currently with bil LE wound vacs) with setup assist and supervision throughout. Pt washes all applicable body parts including back, completing lateral leans to wash buttocks. Pt doffing/donning underwear at bed level demonstrating lateral leans for pulling underwear over hips. Setup assist and mod verbal cues for correctly donning hospital gown (Pt attempting to don around back). Pt completed posterior transfer EOB to w/c with minA for safety and min verbal cues for technique. Pt self-propelled w/c around CIR unit with intermittent rest breaks throughout. Noted increased morale and motivation to participate in therapy after getting out of room and receiving further education on CIR process and goals. Pt requesting to return to bed end of session after return to room with Pt completing lateral transfer w/c to EOB with minA and min verbal cues for safety/technique. Pt left supine in bed, call bell and needs within reach.   OT Evaluation Precautions/Restrictions  Precautions Precautions: Fall Precaution Comments: bil BKA  Restrictions Weight Bearing Restrictions: No Other Position/Activity Restrictions: Wound vacs B LE's General Chart Reviewed: Yes Vital Signs Therapy Vitals Temp: 98.8 F (37.1 C) Temp Source: Oral Pulse Rate: 90 Resp: 18 BP: (!) 145/84 Patient Position (if appropriate): Lying Oxygen Therapy SpO2: 99 % O2 Device: Not Delivered Pain Pain Assessment Pain Assessment: Faces Faces  Pain Scale: Hurts little more Pain Type: Surgical  pain Pain Location: Leg Pain Orientation: Right;Left Pain Descriptors / Indicators: Aching Pain Onset: On-going Pain Intervention(s): Repositioned Home Living/Prior Functioning Home Living Available Help at Discharge: Family, Friend(s), Available 24 hours/day Type of Home: House (boarding house per chart review ) Home Access: Stairs to enter Technical brewer of Steps: 3 Entrance Stairs-Rails: Left Home Layout: Two level, Able to live on main level with bedroom/bathroom Alternate Level Stairs-Rails: Right, Left Bathroom Shower/Tub: Tub/shower unit, Architectural technologist: Standard  Lives With: Family IADL History Homemaking Responsibilities: Yes (minimal ) Meal Prep Responsibility: No Shopping Responsibility: Secondary Leisure and Hobbies: sleeping  Prior Function Level of Independence: Requires assistive device for independence, Other (comment), Independent with basic ADLs (using power w/c for community mobility and RW for in-home mobility ) ADL ADL ADL Comments: see functional navigator  Vision Patient Visual Report: No change from baseline Vision Assessment?: No apparent visual deficits Perception  Perception: Within Functional Limits Praxis Praxis: Intact Cognition Overall Cognitive Status: Within Functional Limits for tasks assessed Arousal/Alertness: Awake/alert Orientation Level: Person;Place;Situation Person: Oriented Place: Oriented Situation: Oriented Year: 2017 Month: July (requires min verbal cues) Day of Week: Correct (requires min verbal cues ) Memory: Appears intact Immediate Memory Recall: Sock;Blue;Bed Memory Recall: Blue;Bed Memory Recall Blue: Without Cue Memory Recall Bed: Without Cue Attention: Alternating Awareness: Appears intact Problem Solving: Appears intact (min verbal cues ) Executive Function: Self Correcting Self Correcting: Impaired (minimally) Behaviors: Other (comment)  (understandably emotional due to recent events (loss of both limbs)) Safety/Judgment: Impaired (min verbal cues for safety ) Comments: Pt requries min verbal cues for transfer techniques in order to safely complete  Sensation Sensation Light Touch: Appears Intact Coordination Gross Motor Movements are Fluid and Coordinated: Yes Fine Motor Movements are Fluid and Coordinated: Yes Motor  Motor Motor: Within Functional Limits (UEs) Mobility  Bed Mobility Bed Mobility: Supine to Sit;Sitting - Scoot to [employer] of Bed;Sit to Supine Supine to Sit: 5: Supervision Supine to Sit Details: Verbal cues for precautions/safety Sitting - Scoot to Edge of Bed: 5: Supervision Sitting - Scoot to [employer] of Bed Details: Verbal cues for precautions/safety;Verbal cues for technique Sit to Supine: 5: Supervision Sit to Supine - Details: Verbal cues for technique;Verbal cues for sequencing Transfers Transfers:  (Pt completing anterior/posterior and lateral transfers only at time of eval; completes transfers with minGuard and MinA )  Trunk/Postural Assessment  Cervical Assessment Cervical Assessment: Within Functional Limits Thoracic Assessment Thoracic Assessment: Within Functional Limits Lumbar Assessment Lumbar Assessment: Within Functional Limits Postural Control Postural Control: Within Functional Limits  Balance Balance Balance Assessed: Yes Static Sitting Balance Static Sitting - Balance Support: Right upper extremity supported;Left upper extremity supported;No upper extremity supported Static Sitting - Level of Assistance: 5: Stand by assistance Dynamic Sitting Balance Dynamic Sitting - Balance Support: Right upper extremity supported;Left upper extremity supported;No upper extremity supported;During functional activity Dynamic Sitting - Level of Assistance: 5: Stand by assistance Dynamic Sitting - Balance Activities: Lateral lean/weight shifting;Forward lean/weight shifting;Reaching across  midline;Other (comment) Sitting balance - Comments: Pt completed bathing at EOB, completing lateral leans to wash buttocks, leaning forward to obtain ADL items, maintaining balance with close supervision for safety throughout  Extremity/Trunk Assessment RUE Assessment RUE Assessment: Within Functional Limits LUE Assessment LUE Assessment: Within Functional Limits   See Function Navigator for Current Functional Status.   Refer to Care Plan for Long Term Goals  Recommendations for other services: None    Discharge Criteria: Patient will be discharged from OT if patient refuses treatment 3 consecutive times  without medical reason, if treatment goals not met, if there is a change in medical status, if patient makes no progress towards goals or if patient is discharged from hospital.  The above assessment, treatment plan, treatment alternatives and goals were discussed and mutually agreed upon: by patient  Raymondo Band 04/14/2017, 4:28 PM

## 2017-04-14 NOTE — Evaluation (Signed)
Physical Therapy Assessment and Plan  Patient Details  Name: [name] MRN: [id number] Date of Birth: 1961-06-01  PT Diagnosis: Abnormality of gait, Impaired sensation and Muscle weakness Rehab Potential: Good ELOS: 5-7 days    Today's Date: 04/14/2017 PT Individual Time:1600-1700 18mn       Problem List:  Patient Active Problem List   Diagnosis Date Noted  . Phantom limb pain (HRural [place]   . S/P bilateral below knee amputation (HS[facility] [place] 04/13/2017  . Tobacco abuse   . Benign essential HTN   . Post-operative pain   . Acute blood loss anemia   . Atherosclerosis of native arteries of extremities with gangrene, left leg (HE[place]   . Atherosclerosis of native arteries of extremities with gangrene, right leg (HK[place]   . Wound infection 03/30/2017  . AKI (acute kidney injury) ([facility] 02/07/2017  . Acute kidney injury (HG[place] 02/06/2017  . Wound healing, delayed   . Osteomyelitis (HG[place]   . Ulcer with gangrene, with necrosis of muscle ([place]   . Skin ulcer of lower leg with necrosis of muscle, unspecified laterality ([place] 06/04/2015  . Protein-calorie malnutrition, severe (H[place] 01/15/2015  . MDD (major depressive disorder), recurrent episode, severe (HG[place] 10/16/2014  . Cocaine use disorder, severe, dependence (HBell [place] 10/10/2014  . Cocaine-induced vascular disorder (HS[place] 06/19/2013  . Essential hypertension 02/26/2010    Past Medical History:  Past Medical History:  Diagnosis Date  . CAP (community acquired pneumonia) 03/2014 X 2  . Cocaine abuse    ongoing with resultant vaculitis.  . Depression   . Headache    "weekly" (07/29/2016)  . Hypertension   . Inflammatory arthritis   . Migraines    "probably 5-6/yr" (07/29/2016)  . Normocytic anemia    BL Hgb 9.8-12. Last anemia panel 04/2010 - showing Fe 19, ferritin 101.  Pt on monthly B12 injections  . Rheumatoid arthritis(714.0)    patient reported  . VASCULITIS 04/17/2010   2/2 levimasole toxicity vs autoimmune d/o   ;  2/2 Levimasole  toxicity. Followed by Dr. NLouanne Skye  Past Surgical History:  Past Surgical History:  Procedure Laterality Date  . AMPUTATION Left 05/22/2016   Procedure: AMPUTATION LEFT LONG FINGER;  Surgeon: MMarybelle Killings MD;  [place] [facility]  Service: Orthopedics;  Laterality: Left;  . AMPUTATION Bilateral 04/10/2017   Procedure: AMPUTATION BELOW KNEE;  Surgeon: DNewt Minion MD;  [place]  Service: Orthopedics;  Laterality: Bilateral;  . HERNIA REPAIR     "stomach"  . I&D EXTREMITY Right 09/26/2015   Procedure: IRRIGATION AND DEBRIDEMENT LEG WOUND  VAC PLACEMENT.;  Surgeon: CLoel LoftyDillingham, DO;  [place] [facility]  Service: Plastics;  Laterality: Right;  . INCISION AND DRAINAGE OF WOUND Bilateral 10/20/2016   Procedure: IRRIGATION AND DEBRIDEMENT WOUND BILATERAL;  Surgeon: BEdrick Kins DPM;  Location: [facility]  Service: Podiatry;  Laterality: Bilateral;  . IRRIGATION AND DEBRIDEMENT ABSCESS Bilateral 09/26/2013   Procedure: DEBRIDEMENT ULCERS BILATERAL THIGHS;  Surgeon: JGwenyth Ober MD;  Location: [facility]  Service: General;  Laterality: Bilateral;  . SKIN BIOPSY Bilateral    shin nodules    Assessment & Plan Clinical Impression: Patient is a 56y.o. 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 Per chart review and patient, patient lives in a local boarding house. Independent with electric scooter prior to admission In the community and used a walker while in  the home. She has an aunt and a brother who work at the boarding home who can assist . Presented 04/08/2017 with increasing leg pain as well as foul-smelling ulcerations lower extremities with exposed bone and necrotic tissue. Underwent bilateral transtibial amputation 04/10/2017 per Dr. Sharol Given. Hospital course pain management. Acute blood loss anemia and transfused. Subcutaneous heparin for DVT  prophylaxis.  Patient transferred to CIR on 04/13/2017 .   Patient currently requires supervision with mobility secondary to muscle weakness, decreased cardiorespiratoy endurance and decreased balance strategies.  Prior to hospitalization, patient was modified independent  with mobility and lived with Family in a House (boarding house per chart review ) home.  Home access is 3Stairs to enter.  Patient will benefit from skilled PT intervention to maximize safe functional mobility, minimize fall risk and decrease caregiver burden for planned discharge home with intermittent assist.  Anticipate patient will benefit from follow up [facility] at discharge.  PT - End of Session Activity Tolerance: Tolerates 30+ min activity with multiple rests Endurance Deficit: Yes PT Assessment Rehab Potential (ACUTE/IP ONLY): Good PT Barriers to Discharge: Inaccessible home environment;Decreased caregiver support PT Patient demonstrates impairments in the following area(s): Balance;Endurance;Pain;Nutrition;Safety;Sensory;Skin Integrity PT Transfers Functional Problem(s): Bed Mobility;Bed to Chair;Car;Floor;Furniture PT Locomotion Functional Problem(s): Ambulation;Wheelchair Mobility;Stairs PT Plan PT Intensity: Minimum of 1-2 x/day ,45 to 90 minutes PT Frequency: 5 out of 7 days PT Duration Estimated Length of Stay: 5-7 days  PT Treatment/Interventions: Balance/vestibular training;Community reintegration;Disease management/prevention;Discharge planning;Cognitive remediation/compensation;DME/adaptive equipment instruction;Functional electrical stimulation;Functional mobility training;Neuromuscular re-education;Pain management;Patient/family education;Psychosocial support;Skin care/wound management;Splinting/orthotics;Stair training;Therapeutic Exercise;UE/LE Coordination activities;UE/LE Strength taining/ROM;Wheelchair propulsion/positioning;Therapeutic Activities;Visual/perceptual remediation/compensation PT Transfers  Anticipated Outcome(s): Mod I at [facility] level  PT Locomotion Anticipated Outcome(s): Mod I with LRAD  PT Recommendation Follow Up Recommendations: Home health PT Patient destination: Home Equipment Recommended: Other (comment) (Slide board ) Equipment Details: Pt has power and manual WC/   Skilled Therapeutic Intervention PT instructed patient in PT Evaluation and initiated treatment intervention; see below for results. PT educated patient in [place], rehab potential, rehab goals, and discharge recommendations.    PT Evaluation Precautions/Restrictions Precautions Precautions: Fall Precaution Comments: bil BKA  Restrictions Weight Bearing Restrictions: No Other Position/Activity Restrictions: Wound vacs B LE's General   Vital SignsTherapy Vitals Temp: 98.8 F (37.1 C) Temp Source: Oral Pulse Rate: 90 Resp: 18 BP: (!) 145/84 Patient Position (if appropriate): Lying Oxygen Therapy SpO2: 99 % O2 Device: Not Delivered Pain Pain Assessment Pain Assessment: Faces Faces Pain Scale: Hurts little more Pain Type: Surgical pain Pain Location: Leg Pain Orientation: Right;Left Pain Descriptors / Indicators: Aching Pain Onset: On-going Pain Intervention(s): Repositioned Home Living/Prior Functioning Home Living Available Help at Discharge: Family;Friend(s);Available 24 hours/day Type of Home: House (boarding house per chart review ) Home Access: Stairs to enter [employer] of Steps: 3 Entrance Stairs-Rails: Left Home Layout: Two level;Able to live on main level with bedroom/bathroom Alternate Level Stairs-Rails: Right;Left Bathroom Shower/Tub: Tub/shower unit;Curtain Bathroom Toilet: Standard  Lives With: Family Prior Function Level of Independence: Requires assistive device for independence;Other (comment);Independent with basic ADLs (using power w/c for community mobility and RW for in-home mobility )  Able to Take Stairs?: Yes Vocation: On  disability Vision/Perception  Perception Perception: Within Functional Limits Praxis Praxis: Intact  Cognition Overall Cognitive Status: Within Functional Limits for tasks assessed Arousal/Alertness: Awake/alert Attention: Alternating Memory: Appears intact Awareness: Appears intact Problem Solving: Appears intact (min verbal cues ) Executive Function: Self Correcting Self Correcting: Impaired (minimally) Behaviors: Other (comment) (understandably emotional due to recent events (loss  of both limbs)) Safety/Judgment: Impaired (min verbal cues for safety ) Comments: Pt requries min verbal cues for transfer techniques in order to safely complete  Sensation Sensation Light Touch: Appears Intact Proprioception: Appears Intact Additional Comments: Pt reports mild decrease appreciation to sensation testing on the RLE compared to the left Coordination Gross Motor Movements are Fluid and Coordinated: Yes Fine Motor Movements are Fluid and Coordinated: Yes Motor  Motor Motor: Within Functional Limits;Other (comment) Motor - Skilled Clinical Observations: Mild strength deficits in BLE   Mobility Bed Mobility Bed Mobility: Rolling Right;Rolling Left;Supine to Sit;Sit to Supine Rolling Right: 5: Supervision Rolling Right Details: Verbal cues for technique Rolling Left: 5: Supervision Rolling Left Details: Verbal cues for technique Supine to Sit: 5: Supervision Supine to Sit Details: Verbal cues for technique Sitting - Scoot to Edge of Bed: 5: Supervision Sitting - Scoot to [employer] of Bed Details: Verbal cues for precautions/safety;Verbal cues for technique Sit to Supine: 5: Supervision Sit to Supine - Details: Verbal cues for technique Transfers Transfers: Yes Anterior-Posterior Transfer: 5: Supervision Anterior-Posterior Transfer Details: Verbal cues for technique;Verbal cues for precautions/safety Lateral/Scoot Transfers: 5: Supervision Lateral/Scoot Transfer Details: Verbal cues  for technique;Verbal cues for precautions/safety  SB car transfer completed with min assist for set up and safety.  Locomotion  Ambulation Ambulation: No Gait Gait: No Stairs / Additional Locomotion Stairs: No Architect: Yes Wheelchair Assistance: 5: Careers information officer: Both upper extremities Wheelchair Parts Management: Supervision/cueing  Trunk/Postural Assessment  Cervical Assessment Cervical Assessment: Within Scientist, physiological Assessment: Within Functional Limits Lumbar Assessment Lumbar Assessment: Within Functional Limits Postural Control Postural Control: Within Functional Limits  Balance Balance Balance Assessed: Yes Static Sitting Balance Static Sitting - Balance Support: Right upper extremity supported;Left upper extremity supported;No upper extremity supported Static Sitting - Level of Assistance: 5: Stand by assistance Dynamic Sitting Balance Dynamic Sitting - Balance Support: Right upper extremity supported;Left upper extremity supported;No upper extremity supported;During functional activity Dynamic Sitting - Level of Assistance: 5: Stand by assistance Dynamic Sitting - Balance Activities: Lateral lean/weight shifting;Forward lean/weight shifting;Reaching across midline Sitting balance - Comments: Pt completed bathing at EOB, completing lateral leans to wash buttocks, leaning forward to obtain ADL items, maintaining balance with close supervision for safety throughout  Extremity Assessment  RUE Assessment RUE Assessment: Within Functional Limits LUE Assessment LUE Assessment: Within Functional Limits RLE Assessment RLE Assessment: Exceptions to [facility] RLE AROM (degrees) RLE Overall AROM Comments: WFL for hip and knee RLE Strength RLE Overall Strength Comments: 4+/5 in hip and 3/5 knee flexion/extension due to pain  LLE Assessment LLE Assessment: Exceptions to WFL LLE AROM  (degrees) LLE Overall AROM Comments: WFL for hip and knee LLE Strength LLE Overall Strength Comments: 4+/5 in hip and 3/5 knee flexion/extension due to pain    See Function Navigator for Current Functional Status.   Refer to Care Plan for Long Term Goals  Recommendations for other services: Neuropsych  Discharge Criteria: Patient will be discharged from PT if patient refuses treatment 3 consecutive times without medical reason, if treatment goals not met, if there is a change in medical status, if patient makes no progress towards goals or if patient is discharged from hospital.  The above assessment, treatment plan, treatment alternatives and goals were discussed and mutually agreed upon: by patient  Lorie Phenix 04/14/2017, 4:46 PM

## 2017-04-14 NOTE — IPOC Note (Signed)
Overall Plan of Care [facility]) Patient Details Name: [name] MRN: [id number] DOB: 26-Sep-1961  Admitting Diagnosis: B BKA  Hospital Problems: Active Problems:   S/P bilateral below knee amputation ([place])   Phantom limb pain ([place])     Functional Problem List: Nursing Bladder, Endurance, Medication Management, Nutrition, Pain, Safety, Skin Integrity  PT Balance, Endurance, Pain, Nutrition, Safety, Sensory, Skin Integrity  OT Endurance, Pain, Safety  SLP    TR         Basic ADL's: OT Grooming, Bathing, Dressing, Toileting     Advanced  ADL's: OT       Transfers: PT Bed Mobility, Bed to Chair, Car, Floor, Manufacturing systems engineer, Metallurgist: PT Ambulation, Emergency planning/management officer, Stairs     Additional Impairments: OT    SLP        TR      Anticipated Outcomes Item Anticipated Outcome  Self Feeding mod independent  Swallowing      Basic self-care  mod independent  Toileting  mod independent   Bathroom Transfers mod independent  Bowel/Bladder  min assist  Transfers  Mod I at [facility] level   Locomotion  Mod I with LRAD   Communication     Cognition     Pain  4 or less  Safety/Judgment  min assist   Therapy Plan: PT Intensity: Minimum of 1-2 x/day ,45 to 90 minutes PT Frequency: 5 out of 7 days PT Duration Estimated Length of Stay: 5-7 days  OT Intensity: Minimum of 1-2 x/day, 45 to 90 minutes OT Frequency: 5 out of 7 days OT Duration/Estimated Length of Stay: 4-6 days          Team Interventions: Nursing Interventions Bladder Management, Patient/Family Education, Disease Management/Prevention, Skin Care/Wound Management, Medication Management, Pain Management, Discharge Planning, Psychosocial Support  PT interventions Balance/vestibular training, Community reintegration, Disease management/prevention, Discharge planning, Cognitive remediation/compensation, DME/adaptive equipment instruction, Functional electrical stimulation, Functional  mobility training, Neuromuscular re-education, Pain management, Patient/family education, Psychosocial support, Skin care/wound management, Splinting/orthotics, Stair training, Therapeutic Exercise, UE/LE Coordination activities, UE/LE Strength taining/ROM, Wheelchair propulsion/positioning, Therapeutic Activities, Visual/perceptual remediation/compensation  OT Interventions Balance/vestibular training, Discharge planning, Pain management, Self Care/advanced ADL retraining, Therapeutic Activities, UE/LE Coordination activities, Functional mobility training, Patient/family education, Therapeutic Exercise, Community reintegration, Engineer, drilling, UE/LE Strength taining/ROM, Wheelchair propulsion/positioning  SLP Interventions    TR Interventions    SW/CM Interventions Discharge Planning, Barrister's clerk, Patient/Family Education    Team Discharge Planning: Destination: PT-Home ,OT- Home , SLP-  Projected Follow-up: PT-Home health PT, OT-  Other (comment) (TBD), SLP-  Projected Equipment Needs: PT-Other (comment) (Slide board ), OT- To be determined, SLP-  Equipment Details: PT-Pt has power and manual WC/ , OT-anticipate Pt will need BSC (droparm?) and tub bench  Patient/family involved in discharge planning: PT- Patient,  OT-Patient, SLP-   MD ELOS: 5-7 days. Medical Rehab Prognosis:  Good Assessment: 56 y.o. 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 Presented 04/08/2017 with increasing leg pain as well as foul-smelling ulcerations lower extremities with exposed bone and necrotic tissue. Underwent bilateral transtibial amputation 04/10/2017 per Dr. Sharol Given. Hospital course pain management. Acute blood loss anemia and transfused. Pt with resulting functional deficits with mobility, transfers, self-care.  Will set goals for Mod I  with most tasks with PT/OT.   See Team Conference Notes for weekly updates to the plan of care

## 2017-04-14 NOTE — Progress Notes (Signed)
Patient information reviewed and entered into eRehab system by Malonie Tatum, RN, CRRN, PPS Coordinator.  Information including medical coding and functional independence measure will be reviewed and updated through discharge.    

## 2017-04-15 ENCOUNTER — Encounter (HOSPITAL_COMMUNITY): Payer: Self-pay | Admitting: *Deleted

## 2017-04-15 ENCOUNTER — Inpatient Hospital Stay (HOSPITAL_COMMUNITY): Payer: Medicaid Other | Admitting: Occupational Therapy

## 2017-04-15 ENCOUNTER — Inpatient Hospital Stay (HOSPITAL_COMMUNITY): Payer: Self-pay

## 2017-04-15 DIAGNOSIS — I169 Hypertensive crisis, unspecified: Secondary | ICD-10-CM

## 2017-04-15 DIAGNOSIS — R269 Unspecified abnormalities of gait and mobility: Secondary | ICD-10-CM

## 2017-04-15 MED ORDER — AMLODIPINE BESYLATE 10 MG PO TABS
10.0000 mg | ORAL_TABLET | Freq: Every day | ORAL | Status: DC
  Administered 2017-04-15 – 2017-04-17 (×3): 10 mg via ORAL
  Filled 2017-04-15 (×3): qty 1

## 2017-04-15 NOTE — Progress Notes (Signed)
[name], female   DOB: [birth year], 56 y.o.   MRN: [id number] Postoperative day 5 bilateral transtibial amputations. We will order stump shrinker is from [employer] prosthetics. When the stump shrinker arrives remove the wound VAC and applied bilateral stump shrinker.

## 2017-04-15 NOTE — Progress Notes (Signed)
[facility] Individual Statement of Services  Patient Name:  [name]  Date:  04/15/2017  Welcome to the [facility].  Our goal is to provide you with an individualized program based on your diagnosis and situation, designed to meet your specific needs.  With this comprehensive rehabilitation program, you will be expected to participate in at least 3 hours of rehabilitation therapies Monday-Friday, with modified therapy programming on the weekends.  Your rehabilitation program will include the following services:  Physical Therapy (PT), Occupational Therapy (OT), 24 hour per day rehabilitation nursing, Therapeutic Recreaction (TR), Case Management (Social Worker), Rehabilitation Medicine, Nutrition Services and Pharmacy Services  Weekly team conferences will be held on Wednesdays to discuss your progress.  Your Social Worker will talk with you frequently to get your input and to update you on team discussions.  Team conferences with you and your family in attendance may also be held.  Expected length of stay:  4 to 7 days  Overall anticipated outcome:  Modified independent from the wheelchair; supervision for car transfers and total assistance f  Depending on your progress and recovery, your program may change. Your Social Worker will coordinate services and will keep you informed of any changes. Your Social Worker's name and contact numbers are listed  below.  The following services may also be recommended but are not provided by the [facility] will be made to provide these services after discharge if needed.  Arrangements include referral to agencies that provide these services.  Your insurance has been verified to be:  Medicaid Your primary doctor is:  Dr. Molli Barrows   Pertinent information will be shared  with your doctor and your insurance company.  Social Worker:  Alfonse Alpers, LCSW  [phone] or [phone]  Information discussed with and copy given to patient by: Trey Sailors, 04/15/2017, 11:31 AM

## 2017-04-15 NOTE — Progress Notes (Signed)
Occupational Therapy Session Note  Patient Details  Name: [name] MRN: [id number] Date of Birth: 02/24/1961  Today's Date: 04/15/2017 OT Individual Time: 9407-6808 OT Individual Time Calculation (min): 54 min    Short Term Goals: Week 1:  OT Short Term Goal 1 (Week 1): STG=LTG 2/2 ELOS   Skilled Therapeutic Interventions/Progress Updates:    Pt completed transfers from bed to drop arm commode with use of the sliding board with supervision.  She was able to also complete toilet hygiene, donn new brief, and pull up and down paper scrub pants with supervision as well.  Pt rolled her wheelchair down to the tub/shower room for practice with tub/shower transfers.  Scoot transfer completed to tub bench with supervision scooting without sliding board on and off of the tub bench.  Discussed need for hand held shower as well, which pt will pass on to her brother.  Returned to room at end of session with pt transferring back to bed with supervision scoot pivot.  Call button and phone in reach.   Therapy Documentation Precautions:  Precautions Precautions: Fall Precaution Comments: bil BKA  Restrictions Weight Bearing Restrictions: No Other Position/Activity Restrictions: Wound vacs B LE's  Pain: Pain Assessment Pain Assessment: Faces Faces Pain Scale: Hurts a little bit Pain Type: Surgical pain Pain Location: Leg Pain Orientation: Right;Left Pain Descriptors / Indicators: Discomfort Pain Intervention(s): Repositioned;Medication (See eMAR) ADL: See Function Navigator for Current Functional Status.   Therapy/Group: Individual Therapy  Keonta Monceaux OTR/L 04/15/2017, 3:55 PM

## 2017-04-15 NOTE — Progress Notes (Signed)
Orthopedic Tech Progress Note Patient Details:  [name] May 31, 1961 [id number]  Patient ID: [name], female   DOB: [birth year], 56 y.o.   MRN: [id number]   Hildred Priest 04/15/2017, 11:31 AM Called in advanced brace order; spoke with Carolyne Fiscal

## 2017-04-15 NOTE — Patient Care Conference (Signed)
Inpatient RehabilitationTeam Conference and Plan of Care Update Date: 04/14/2017   Time: 2:00 PM    Patient Name: [name]      Medical Record Number: [id number]  Date of Birth: Dec 07, 1960 Sex: Female         Room/Bed: 4M09C/4M09C-01 Payor Info: Payor: MEDICAID [place] / Plan: MEDICAID [place] Number Two ACCESS / Product Type: *No Product type* /    Admitting Diagnosis: B BKA  Admit Date/Time:  04/13/2017  6:17 PM Admission Comments: No comment available   Primary Diagnosis:  <principal problem not specified> Principal Problem: <principal problem not specified>  Patient Active Problem List   Diagnosis Date Noted  . Hypertensive crisis   . Abnormality of gait   . Phantom limb pain ([place])   . S/P bilateral below knee amputation ([place]) 04/13/2017  . Tobacco abuse   . Benign essential HTN   . Post-operative pain   . Acute blood loss anemia   . Atherosclerosis of native arteries of extremities with gangrene, left leg ([place])   . Atherosclerosis of native arteries of extremities with gangrene, right leg ([place])   . Wound infection 03/30/2017  . AKI (acute kidney injury) ([place]) 02/07/2017  . Acute kidney injury ([place]) 02/06/2017  . Wound healing, delayed   . Osteomyelitis ([place])   . Ulcer with gangrene, with necrosis of muscle ([place])   . Skin ulcer of lower leg with necrosis of muscle, unspecified laterality ([place]) 06/04/2015  . Protein-calorie malnutrition, severe ([place]) 01/15/2015  . MDD (major depressive disorder), recurrent episode, severe ([place]) 10/16/2014  . Cocaine use disorder, severe, dependence ([place]) 10/10/2014  . 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 Essential hypertension 02/26/2010    Expected Discharge Date:    Team Members Present:       Current Status/Progress Goal Weekly Team Focus  Medical   Decreased functional mobility secondary to bilateral transtibial amputation 04/10/2017   Improve mobility, transfers, BP  See above   Bowel/Bladder   continent bowel and  bladder LBM 04/13/17 /peri wicks at HS  maintain continence   assess bowel and bladder q shift   Swallow/Nutrition/ Hydration             ADL's   supervision for EOB and bed level ADLs including bathing and dressing (UB/LB); MinA for functional mobility transfers  mod independent  ADL retraining, functional mobility transfers, endurance/activity tolerance, dynamic sitting balance    Mobility   supervision for lateral scoot transfers to slideboard, supervision for bed mobility, supervision for all w/c mobility  mod I for all mobility and transfers  family/pt education, balance retraining, LE strengthening, ROM, endurance/activity tolerance   Communication             Safety/Cognition/ Behavioral Observations  no unsafe behavior  min assist  assess safety q shift   Pain   prn oxy 43m /neurontin/robaxin  pain less than or equal to 4  assess pain q shift and prn   Skin   bilateral BKA with wound VAC on continuously; psoriasis like patches to skin  ski free from infection/breakdown during rehab stay  assess skin q shift and prn    Rehab Goals Patient on target to meet rehab goals: Yes Rehab Goals Revised: none - pt's first conference *See Care Plan and progress notes for long and short-term goals.     Barriers to Discharge  Current Status/Progress Possible Resolutions Date Resolved   Physician              Therapies, optimize BP meds,  counsel        Nursing                    PT                      OT                    SLP                  SW Home environment access/layout Brother working on building a ramp.            Discharge Planning/Teaching Needs:  Pt to return to the boarding house where she lives and her brother manages.  Pt's brother to come for family education and CSW asked her to have him bring in the w/c.   Team Discussion:  Pt with bilateral BKAs, expected blood loss anemia, wound VACs (will come off prior to d/c).  MD is working to manage medication to  lower BP.    Revisions to Treatment Plan:  none    Continued Need for Acute Rehabilitation Level of Care: The patient requires daily medical management by a physician with specialized training in physical medicine and rehabilitation for the following conditions: Daily direction of a multidisciplinary physical rehabilitation program to ensure safe treatment while eliciting the highest outcome that is of practical value to the patient.: Yes Daily medical management of patient stability for increased activity during participation in an intensive rehabilitation regime.: Yes Daily analysis of laboratory values and/or radiology reports with any subsequent need for medication adjustment of medical intervention for : Post surgical problems;Wound care problems;Blood pressure problems  Rea Reser, Silvestre Mesi 04/15/2017, 1:44 PM

## 2017-04-15 NOTE — Progress Notes (Signed)
[facility] PHYSICAL MEDICINE & REHABILITATION     PROGRESS NOTE  Subjective/Complaints:  Pt seen laying in bed this AM.  She slept well overnight.  She states she wants to go home.    ROS: Denies CP, SOB, N/V/D.  Objective: Vital Signs: Blood pressure (!) 181/90, pulse 79, temperature 98.5 F (36.9 C), temperature source Oral, resp. rate 18, height 5' 1"  (1.549 m), weight 38.7 kg (85 lb 5.1 oz), SpO2 98 %. No results found.  Recent Labs  04/13/17 0355 04/14/17 0731  WBC 7.9 6.0  HGB 8.7* 9.4*  HCT 27.0* 29.6*  PLT 375 400    Recent Labs  04/13/17 0355 04/14/17 0731  NA 139 140  K 3.8 3.9  CL 110 107  GLUCOSE 96 99  BUN 14 11  CREATININE 0.62 0.67  CALCIUM 8.7* 9.0   CBG (last 3)  No results for input(s): GLUCAP in the last 72 hours.  Wt Readings from Last 3 Encounters:  04/15/17 38.7 kg (85 lb 5.1 oz)  04/13/17 47.7 kg (105 lb 1.6 oz)  03/30/17 35.6 kg (78 lb 6.4 oz)    Physical Exam:  BP (!) 181/90 (BP Location: Left Arm)   Pulse 79   Temp 98.5 F (36.9 C) (Oral)   Resp 18   Ht 5' 1"  (1.549 m)   Wt 38.7 kg (85 lb 5.1 oz)   SpO2 98%   BMI 16.12 kg/m  Gen: NAD. Vital signs reviewed.  HENT: Normocephalic. Atraumatic.  Eyes: EOM are normal. No discharge.  Cardiovascular: RRR. No JVD. Respiratory: Breath sounds normal. Clear.  GI: Soft. Bowel sounds are normal.  Musculoskeletal: She exhibits tenderness. She exhibits no edema.  Neurological: She is alert and oriented.  Motor: 5/5 Bilateral UE prox to distal.  B/l LE: HF 4/5. KE 4-/5 (pain inhibition).  Skin. Bilateral transtibial amputation sites are dressed with wound VACs in place  Psychiatric: She has a normal mood and affect. Her behavior is normal. Thought content normal.   Assessment/Plan: 1. Functional deficits secondary to bilateral transtibial amputation which require 3+ hours per day of interdisciplinary therapy in a comprehensive inpatient rehab setting. Physiatrist is providing close team  supervision and 24 hour management of active medical problems listed below. Physiatrist and rehab team continue to assess barriers to discharge/monitor patient progress toward functional and medical goals.  Function:  Bathing Bathing position   Position: Sitting EOB  Bathing parts Body parts bathed by patient: Right arm, Left arm, Chest, Abdomen, Front perineal area, Buttocks, Right upper leg, Left upper leg, Back    Bathing assist Assist Level: Supervision or verbal cues      Upper Body Dressing/Undressing Upper body dressing   What is the patient wearing?: Hospital gown                Upper body assist Assist Level: Supervision or verbal cues      Lower Body Dressing/Undressing Lower body dressing   What is the patient wearing?: Underwear Underwear - Performed by patient: Thread/unthread right underwear leg, Thread/unthread left underwear leg, Pull underwear up/down                            Lower body assist Assist for lower body dressing: Supervision or verbal cues      Toileting Toileting          Toileting assist     Transfers Chair/bed transfer   Chair/bed transfer method: Lateral scoot, Anterior/posterior Chair/bed  transfer assist level: Supervision or verbal cues Chair/bed transfer assistive device: Armrests, Sliding board     Locomotion Ambulation Ambulation activity did not occur: Safety/medical concerns         Wheelchair   Type: Manual Max wheelchair distance: 141f  Assist Level: Supervision or verbal cues  Cognition Comprehension Comprehension assist level: Follows complex conversation/direction with no assist  Expression Expression assist level: Expresses complex ideas: With no assist  Social Interaction Social Interaction assist level: Interacts appropriately with others - No medications needed.  Problem Solving Problem solving assist level: Solves complex problems: Recognizes & self-corrects  Memory Memory assist level:  Recognizes or recalls 90% of the time/requires cueing < 10% of the time    Medical Problem List and Plan:  1. Decreased functional mobility secondary to bilateral transtibial amputation 04/10/2017   Cont CIR 2. DVT Prophylaxis/Anticoagulation: Subcutaneous heparin. Monitor platelet counts and any signs of bleeding  3. Pain Management: pt with stump and phantom limb pain   Neurontin 100 mg 3 times a day, oxycodone and Robaxin as needed  4. Mood: Wellbutrin 150 mg daily, Remeron 7.5 mg daily at bedtime  5. Neuropsych: This patient is capable of making decisions on her own behalf.  6. Skin/Wound Care: Routine skin checks   continue vac therapy ~7/14 7. Fluids/Electrolytes/Nutrition: Routine I&Os  BMP WNL on 7/11 8. Acute blood loss anemia. Continue iron supplement.  Hb 9.4 on 7/11  Cont to monitor 9. Hypertension.   Norvasc 5 mg daily, increased to 190mon 7/12  Hypertensive crisis this AM 10. History of cocaine abuse. Provide counseling  11.History of MRSA PCR screen positive. Contact precautions  12. Constipation. Laxative assistance  13. Hypoalbuminemia  Supplement initiated 7/11  LOS (Days) 2 A FACE TO FACE EVALUATION WAS PERFORMED  Ankit AnLorie Phenix/09/2017 8:01 AM

## 2017-04-15 NOTE — Progress Notes (Signed)
Occupational Therapy Session Note  Patient Details  Name: [name] MRN: [id number] Date of Birth: January 17, 1961  Today's Date: 04/15/2017 OT Individual Time: 8346-2194 OT Individual Time Calculation (min): 57 min    Short Term Goals: Week 1:  OT Short Term Goal 1 (Week 1): STG=LTG 2/2 ELOS   Skilled Therapeutic Interventions/Progress Updates:    Pt completed bathing from EOB during session.  She was able to complete all aspects with supervision including donning new brief, with lateral leans side to side.  She also completed anterior/posterior transfer to drop arm commode as well with supervision including toilet hygiene.  Pt left in bed finishing breakfast to conclude session.  Call button and phone in reach.    Therapy Documentation Precautions:  Precautions Precautions: Fall Precaution Comments: bil BKA  Restrictions Weight Bearing Restrictions: No Other Position/Activity Restrictions: Wound vacs B LE's   Pain: Pain Assessment Pain Assessment: Faces Pain Score: 4  Faces Pain Scale: Hurts a little bit Pain Location: Leg Pain Orientation: Right;Left Pain Intervention(s): Medication (See eMAR);Repositioned ADL: See Function Navigator for Current Functional Status.   Therapy/Group: Individual Therapy  Aara Jacquot OTR/L 04/15/2017, 12:22 PM

## 2017-04-15 NOTE — Progress Notes (Addendum)
Social Work Assessment and Plan  Patient Details  Name: [name] MRN: [id number] Date of Birth: Apr 08, 1961  Today's Date: 04/14/2017  Problem List:  Patient Active Problem List   Diagnosis Date Noted  . Hypertensive crisis   . Abnormality of gait   . Phantom limb pain ([place])   . S/P bilateral below knee amputation ([place]) 04/13/2017  . Tobacco abuse   . Benign essential HTN   . Post-operative pain   . Acute blood loss anemia   . Atherosclerosis of native arteries of extremities with gangrene, left leg ([place])   . Atherosclerosis of native arteries of extremities with gangrene, right leg ([place])   . Wound infection 03/30/2017  . AKI (acute kidney injury) ([place]) 02/07/2017  . Acute kidney injury ([place]) 02/06/2017  . Wound healing, delayed   . Osteomyelitis ([place])   . Ulcer with gangrene, with necrosis of muscle ([place])   . Skin ulcer of lower leg with necrosis of muscle, unspecified laterality ([place]) 06/04/2015  . Protein-calorie malnutrition, severe ([place]) 01/15/2015  . MDD (major depressive disorder), recurrent episode, severe ([place]) 10/16/2014  . Cocaine use disorder, severe, dependence ([place]) 10/10/2014  . 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 Essential hypertension 02/26/2010   Past Medical History:  Past Medical History:  Diagnosis Date  . CAP (community acquired pneumonia) 03/2014 X 2  . Cocaine abuse    ongoing with resultant vaculitis.  . Depression   . Headache    "weekly" (07/29/2016)  . Hypertension   . Inflammatory arthritis   . Migraines    "probably 5-6/yr" (07/29/2016)  . Normocytic anemia    BL Hgb 9.8-12. Last anemia panel 04/2010 - showing Fe 19, ferritin 101.  Pt on monthly B12 injections  . Rheumatoid arthritis(714.0)    patient reported  . 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 Followed by Dr. Louanne Skye   Past Surgical History:  Past Surgical History:  Procedure Laterality Date  .  AMPUTATION Left 05/22/2016   Procedure: AMPUTATION LEFT LONG FINGER;  Surgeon: Marybelle Killings, MD;  Location: [facility];  Service: Orthopedics;  Laterality: Left;  . AMPUTATION Bilateral 04/10/2017   Procedure: AMPUTATION BELOW KNEE;  Surgeon: Newt Minion, MD;  Location: [facility];  Service: Orthopedics;  Laterality: Bilateral;  . HERNIA REPAIR     "stomach"  . I&D EXTREMITY Right 09/26/2015   Procedure: IRRIGATION AND DEBRIDEMENT LEG WOUND  VAC PLACEMENT.;  Surgeon: Loel Lofty Dillingham, DO;  Location: [facility];  Service: Plastics;  Laterality: Right;  . INCISION AND DRAINAGE OF WOUND Bilateral 10/20/2016   Procedure: IRRIGATION AND DEBRIDEMENT WOUND BILATERAL;  Surgeon: Edrick Kins, DPM;  Location: [facility];  Service: Podiatry;  Laterality: Bilateral;  . IRRIGATION AND DEBRIDEMENT ABSCESS Bilateral 09/26/2013   Procedure: DEBRIDEMENT ULCERS BILATERAL THIGHS;  Surgeon: Gwenyth Ober, MD;  Location: [facility];  Service: General;  Laterality: Bilateral;  . SKIN BIOPSY Bilateral    shin nodules   Social History:  reports that she has been smoking Cigarettes.  She has a 4.56 pack-year smoking history. She has never used smokeless tobacco. She reports that she uses drugs, including "Crack" cocaine and Cocaine. She reports that she does not drink alcohol.  Family / Support Systems Marital Status: Single Patient Roles: Other (Comment) (sister, niece, friend) Other Supports: [name] - brother - [phone]; [name] - aunt - [phone] Anticipated Caregiver: Brother, aunt, and friends  Ability/Limitations of  Caregiver: None per pt Caregiver Availability: 24/7 Family Dynamics: [name] is very supportive of pt.  Social History Preferred language: English Religion: Holiness Cultural Background: Pt is estranged from most of her family. Read: Yes Write: Yes Employment Status: Disabled Date Retired/Disabled/Unemployed: 2017 Legal History/Current Legal Issues: Pt with a history of incareration on  and off for 20 years. Guardian/Conservator: N/A - MD has determined that pt is capable of making her own decisions.   Abuse/Neglect Physical Abuse: Denies Verbal Abuse: Denies Sexual Abuse: Denies Exploitation of patient/patient's resources: Denies Self-Neglect: Denies  Emotional Status Pt's affect, behavior and adjustment status: Pt was tearful when talking about being estranged from her family - mother and sisters.  She just wants to find them and have some closure.  She even acknowleged that her mother may be deceased.  Pt is adjusting to her amputations and was more sad about the loss of relationships with her family. Recent Psychosocial Issues: Pt was homeless for 5 years after leaving prison.  She now lives in a boarding house her brother manages.   Psychiatric History: Pt's chart reports depression.  She did not disclose this to CSW, but was tearful during visit. Substance Abuse History: Pt admits to cocaine use.  She has been to inpt treatment in the past and has stopped on her own, too.  Pt reports being ready to give up cocaine completely after having the amputations and has been clean for 20 days.  She plans to stay away from the people who abuse substances as this is her weakness.  Patient / Family Perceptions, Expectations & Goals Pt/Family understanding of illness & functional limitations: Pt reports an understanding of her condition and limitations.   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  Pt reports having good, supportive friends in her life.  She said that the boarding house residents are not cocaine abusers. Anticipated changes in roles/activities/participation: Pt stated that her friends and brother will help her with getting up and down the stairs, so that she can still be out in the community.  Brother working on building a ramp. Pt/family expectations/goals: Pt just wants to learn whatever she can from the therapists while she is here and  "get home!"  [employer]: None Premorbid Home Care/DME Agencies: Other (Comment) ([place] for DME) Transportation available at discharge: brother Resource referrals recommended: Neuropsychology, Support group (specify) Visual merchandiser Amputee Support Group)  Discharge Planning Living Arrangements: Non-relatives/Friends (in a boarding house her brother manages) Support Systems: Friends/neighbors Type of Residence: Other (Comment) (boarding house) Insurance Resources: [employer] (specify county) Pensions consultant: [employer] Financial Screen Referred: No Living Expenses: Education officer, community Management: Patient Does the patient have any problems obtaining your medications?: No (although pt talks about finances being tight with meds, food, rent, etc to pay for.) Home Management: Pt's brother/friends will assist with this. Patient/Family Preliminary Plans: Pt plans to return to her room at the boarding house. Sw Barriers to Discharge: Home environment access/layout Sw Barriers to Discharge Comments: Brother working on building a ramp. Social Work Anticipated Follow Up Needs: HH/OP, Support Group Expected length of stay: 4 to 7 days  Clinical Impression CSW met with pt to introduce self and role of CSW, as well as to complete assessment.  Pt went between being emotional with CSW to being defensive at times, to arguing about being able to get more disability now with bilateral amputations.  She was also more even with CSW, at times, talking about her faith and that God  would see her through this time.  Pt was ultimately pleased she is on CIR and appreciative of CSW's involvement, but stressed multiple times she is ready to go home.  Pt was open with CSW about her cocaine use and made a loose connection to her current health condition and drug use.  She reports having no interest in using again and told CSW that her brother has made a stern, strong plea with her to not use cocaine  anymore.  She stated she's been clean for 20 days and wants to build on that.  She plans to not be around the crowd that usually gets her into trouble. CSW offered community resources.  Pt has been in treatment before.  CSW offered support when pt was tearful about missing her family and will continue to assist during her stay.  Will also refer pt to neuropsychologist if pt is still here next week.  CSW will continue to follow and assist as needed.  [name], Silvestre Mesi 04/15/2017, 12:21 PM

## 2017-04-15 NOTE — Progress Notes (Signed)
Physical Therapy Session Note  Patient Details  Name: [name] MRN: [id number] Date of Birth: Aug 22, 1961  Today's Date: 04/15/2017 PT Individual Time: 1100-1200, 1300-1345 PT Individual Time Calculation (min): 60 min   Short Term Goals: Week 1:  PT Short Term Goal 1 (Week 1): STG=LTG due to ELOS  Skilled Therapeutic Interventions/Progress Updates:  Session 1:   Pt supine in bed upon PT arrival, agreeable to therapy tx. Pt transferred from supine to sitting EOB with supervision. Pt transferred from sitting EOB to w/c lateral scoot, pt requested not to use the slide board, supervision for balance. Pt propelled w/c to the gym x 178f with supervision using B UEs. Pt transferred from w/c to mat lateral scoot without slideboard with supervision. Focused on LE and core strengthening, performed SLR 2 x 10, hip flexion 2 x 10, B leg raises 2 x 10, and opposite LE to UE 2 x 10. In sidelying pt performed 2 x 10 hip abduction each leg. In prone pt performed prone press up on elbows for hip flexor stretch 2 x 30 sec hold and hip extension 2 x 10. Pt performed all bed mobility with supervision. Pt sitting edge of mat without LE or UE support worked on dynamic sitting balance hitting ball back and forth, reaching in different directions 2 x 4 min. Pt transferred from mat>w/c with supervision, lateral scoot. Pt worked on endurance with w/c propulsion, x 300+ ft with supervision using UEs. Pt transferred from w/c back to mat, lateral scoot with supervision. Pt left supine in bed with needs in reach.   Session 2:  Pt supine in bed upon PT arrival, agreeable to therapy tx. Discussed the importance of having family training before d/c in order to practice bumping up/down steps. Pt transferred from bed>w/c with supervision, lateral scoot without slideboard. Pt propelled w/c x 150 ft using UEs with supervision. Pt worked on endurance using the arm bike x 8 minutes on level 4 resistance. Pt practiced transfers to  uneven surfaces with slideboard and min assist for balance x 3. Pt transferred back to bed from w/c with supervision and lateral scoot without slide board. Pt left supine in bed with needs in reach.   Therapy Documentation Precautions:  Precautions Precautions: Fall Precaution Comments: bil BKA  Restrictions Weight Bearing Restrictions: No Other Position/Activity Restrictions: Wound vacs B LE's Vital Signs: Therapy Vitals BP: (!) 170/84 Pain: Pain Assessment Pain Assessment: 0-10 Pain Score: 4  Pain Type: Surgical pain Pain Location: Leg Pain Orientation: Right;Left Pain Descriptors / Indicators: Aching Pain Frequency: Constant Pain Onset: On-going Patients Stated Pain Goal: 4 Pain Intervention(s): Medication (See eMAR)  See Function Navigator for Current Functional Status.   Therapy/Group: Individual Therapy  ENetta Corrigan PT, DPT 04/15/2017, 11:23 AM

## 2017-04-16 ENCOUNTER — Ambulatory Visit (HOSPITAL_COMMUNITY): Payer: Self-pay

## 2017-04-16 ENCOUNTER — Inpatient Hospital Stay (HOSPITAL_COMMUNITY): Payer: Self-pay

## 2017-04-16 ENCOUNTER — Inpatient Hospital Stay (HOSPITAL_COMMUNITY): Payer: Medicaid Other | Admitting: Occupational Therapy

## 2017-04-16 LAB — GLUCOSE, CAPILLARY: GLUCOSE-CAPILLARY: 99 mg/dL (ref 65–99)

## 2017-04-16 MED ORDER — FERROUS SULFATE 325 (65 FE) MG PO TABS: 325.0000 mg | ORAL_TABLET | Freq: Two times a day (BID) | ORAL | 0 refills | Status: DC

## 2017-04-16 MED ORDER — OXYCODONE HCL 5 MG PO TABS
5.0000 mg | ORAL_TABLET | Freq: Four times a day (QID) | ORAL | Status: DC | PRN
  Administered 2017-04-16 – 2017-04-17 (×3): 5 mg via ORAL
  Filled 2017-04-16 (×3): qty 1

## 2017-04-16 MED ORDER — ADULT MULTIVITAMIN W/MINERALS CH: 1.0000 | ORAL_TABLET | Freq: Every day | ORAL | 0 refills | Status: DC

## 2017-04-16 MED ORDER — METHOCARBAMOL 500 MG PO TABS: 500.0000 mg | ORAL_TABLET | Freq: Four times a day (QID) | ORAL | 0 refills | Status: DC | PRN

## 2017-04-16 MED ORDER — SILVER SULFADIAZINE 1 % EX CREA: TOPICAL_CREAM | Freq: Every day | CUTANEOUS | 0 refills | Status: DC

## 2017-04-16 MED ORDER — MIRTAZAPINE 7.5 MG PO TABS: 7.5000 mg | ORAL_TABLET | Freq: Every day | ORAL | 0 refills | Status: DC

## 2017-04-16 MED ORDER — GABAPENTIN 100 MG PO CAPS: 100.0000 mg | ORAL_CAPSULE | Freq: Three times a day (TID) | ORAL | 0 refills | Status: DC

## 2017-04-16 MED ORDER — DOCUSATE SODIUM 100 MG PO CAPS: 100.0000 mg | ORAL_CAPSULE | Freq: Two times a day (BID) | ORAL | 0 refills | Status: DC

## 2017-04-16 MED ORDER — AMLODIPINE BESYLATE 10 MG PO TABS: 10.0000 mg | ORAL_TABLET | Freq: Every day | ORAL | 0 refills | Status: DC

## 2017-04-16 MED ORDER — BUPROPION HCL ER (XL) 150 MG PO TB24: 150.0000 mg | ORAL_TABLET | Freq: Every day | ORAL | 0 refills | Status: DC

## 2017-04-16 MED ORDER — NICOTINE 7 MG/24HR TD PT24
7.0000 mg | MEDICATED_PATCH | Freq: Every day | TRANSDERMAL | Status: DC
  Administered 2017-04-16 – 2017-04-17 (×2): 7 mg via TRANSDERMAL
  Filled 2017-04-16 (×2): qty 1

## 2017-04-16 MED ORDER — OXYCODONE HCL 5 MG PO TABS: 5.0000 mg | ORAL_TABLET | Freq: Two times a day (BID) | ORAL | 0 refills | Status: DC | PRN

## 2017-04-16 MED ORDER — NICOTINE 7 MG/24HR TD PT24: 7.0000 mg | MEDICATED_PATCH | Freq: Every day | TRANSDERMAL | 0 refills | Status: DC

## 2017-04-16 MED ORDER — OXYCODONE HCL 5 MG PO TABS: 5.0000 mg | ORAL_TABLET | Freq: Four times a day (QID) | ORAL | Status: DC | PRN

## 2017-04-16 NOTE — Progress Notes (Signed)
Rx for oxycodone 5 mg # 15 written. Patient given instructions on overdose as well as need to avoid illicit drugs while taking narcotics.

## 2017-04-16 NOTE — Progress Notes (Signed)
Physical Therapy Session Note  Patient Details  Name: [name] MRN: [id number] Date of Birth: 11-13-1960  Today's Date: 04/16/2017 PT Individual Time: 1130-1200, 1415-1500 PT Individual Time Calculation (min): 30 min , 45 min   Short Term Goals: Week 1:  PT Short Term Goal 1 (Week 1): STG=LTG due to ELOS  Skilled Therapeutic Interventions/Progress Updates:    Session 1: 1130-1200 Pt supine in bed upon PT arrival, agreeable to therapy tx. Pt transferred from bed>w/c Mod I using lateral scoot transfer. Pt propelled w/c 129f mod I to gym. Pt's aunt present for family training. PT explained how to perform stair bumping with w/c with a demonstration. Pt's aunt performed stair bumping x 4 steps with empty w/c and then again with pt in w/c. Pt's aunt independent in performing stair bumping 4 steps ascending and descending. Pt performed car transfer with slideboard and supervision, pt's aunt present for demonstration. Pt propelled w/c back to room mod I using B UE's. Pt transferred from w/c to bed mod I, lateral scoot transfer. Discussed importance of using slideboard for uneven surfaces and transfers with larger gaps when at home. Pt left supine in bed with needs in reach. All of aunt's and pt's questions answered, no further questions at this time. Pt denies pain this session.   Session 2: 10349 1500 Pt supine in bed upon PT arrival, agreeable to therapy tx. Pt transferred from supine to sitting EOB mod I and to the chair Mod I lateral scoot transfer. Pt donned leg rests, managed brakes and arm rests mod I. Pt propelled w/c to the gym mod I using B UEs. Pt transferred from w/c to mat lateral scoot, mod I setting up the transfer appropriately and managing breaks/arm rests without cues. Reviewed HEP of LAQ, quad sets, hip flexion, hip extension and hip abduction exercises on the mat. Sitting edge of mat pt worked on dDietitianwhile playing the wii sports tennis, golf and bowling requiring her to  reach and move outside of her base of support and self correcting minimal loss of balance. Pt transferred back to w/c mod I, propelled back to room mod I and transferred back to her bed mod I using lateral scoot. Pt left supine in bed with needs in reach. Pt denies pain this session.   Therapy Documentation Precautions:  Precautions Precautions: Fall Precaution Comments: bil BKA  Restrictions Weight Bearing Restrictions: Yes RLE Weight Bearing: Non weight bearing LLE Weight Bearing: Non weight bearing Other Position/Activity Restrictions: Wound vacs B LE's   See Function Navigator for Current Functional Status.   Therapy/Group: Individual Therapy  ENetta Corrigan PT, DPT 04/16/2017, 3:01 PM

## 2017-04-16 NOTE — Progress Notes (Signed)
[place] PHYSICAL MEDICINE & REHABILITATION     PROGRESS NOTE  Subjective/Complaints:  Pt seen laying in bed this Am.  She slept well overnight.  She was initially bright, but when told it was not in her best interest to go home today she becomes disappointed.   ROS: Denies CP, SOB, N/V/D.  Objective: Vital Signs: Blood pressure (!) 170/86, pulse 85, temperature 99 F (37.2 C), temperature source Oral, resp. rate 18, height 5' 1"  (1.549 m), weight 38.7 kg (85 lb 5.1 oz), SpO2 99 %. No results found.  Recent Labs  04/14/17 0731  WBC 6.0  HGB 9.4*  HCT 29.6*  PLT 400    Recent Labs  04/14/17 0731  NA 140  K 3.9  CL 107  GLUCOSE 99  BUN 11  CREATININE 0.67  CALCIUM 9.0   CBG (last 3)   Recent Labs  04/16/17 0636  GLUCAP 99    Wt Readings from Last 3 Encounters:  04/15/17 38.7 kg (85 lb 5.1 oz)  04/13/17 47.7 kg (105 lb 1.6 oz)  03/30/17 35.6 kg (78 lb 6.4 oz)    Physical Exam:  BP (!) 170/86 (BP Location: Right Arm) Comment (BP Location): rn notified  Pulse 85   Temp 99 F (37.2 C) (Oral)   Resp 18   Ht 5' 1"  (1.549 m)   Wt 38.7 kg (85 lb 5.1 oz)   SpO2 99%   BMI 16.12 kg/m  Gen: NAD. Vital signs reviewed.  HENT: Normocephalic. Atraumatic.  Eyes: EOM are normal. No discharge.  Cardiovascular: RRR. No JVD. Respiratory: Breath sounds normal. Unlabored. GI: Soft. Bowel sounds are normal.  Musculoskeletal: She exhibits tenderness. She exhibits no edema.  Neurological: She is alert and oriented.  Motor: 5/5 Bilateral UE prox to distal.  B/l LE: HF 4/5. KE 4/5 (pain inhibition).  Skin. Bilateral transtibial amputation sites are dressed with wound VACs in place  Psychiatric: She has a normal mood and affect. Her behavior is normal. Thought content normal.   Assessment/Plan: 1. Functional deficits secondary to bilateral transtibial amputation which require 3+ hours per day of interdisciplinary therapy in a comprehensive inpatient rehab  setting. Physiatrist is providing close team supervision and 24 hour management of active medical problems listed below. Physiatrist and rehab team continue to assess barriers to discharge/monitor patient progress toward functional and medical goals.  Function:  Bathing Bathing position   Position: Sitting EOB  Bathing parts Body parts bathed by patient: Right arm, Left arm, Chest, Abdomen, Front perineal area, Buttocks, Right upper leg, Left upper leg, Back    Bathing assist Assist Level: Supervision or verbal cues      Upper Body Dressing/Undressing Upper body dressing   What is the patient wearing?: Hospital gown                Upper body assist Assist Level: Supervision or verbal cues      Lower Body Dressing/Undressing Lower body dressing   What is the patient wearing?: [place] - Performed by patient: Thread/unthread right underwear leg, Thread/unthread left underwear leg, Pull underwear up/down                            Lower body assist Assist for lower body dressing: Supervision or verbal cues      Toileting Toileting   Toileting steps completed by patient: Adjust clothing prior to toileting, Performs perineal hygiene, Adjust clothing after toileting   Toileting Assistive Devices:  Toilet aid  Toileting assist Assist level: Supervision or verbal cues   Transfers Chair/bed transfer   Chair/bed transfer method: Lateral scoot Chair/bed transfer assist level: Supervision or verbal cues Chair/bed transfer assistive device: Armrests     Locomotion Ambulation Ambulation activity did not occur: Safety/medical concerns         Wheelchair   Type: Manual Max wheelchair distance: 148f  Assist Level: Supervision or verbal cues  Cognition Comprehension Comprehension assist level: Follows complex conversation/direction with no assist  Expression Expression assist level: Expresses complex ideas: With no assist  Social Interaction  Social Interaction assist level: Interacts appropriately with others - No medications needed.  Problem Solving Problem solving assist level: Solves complex problems: Recognizes & self-corrects  Memory Memory assist level: Recognizes or recalls 90% of the time/requires cueing < 10% of the time    Medical Problem List and Plan:  1. Decreased functional mobility secondary to bilateral transtibial amputation 04/10/2017   Cont CIR 2. DVT Prophylaxis/Anticoagulation: Subcutaneous heparin. Monitor platelet counts and any signs of bleeding  3. Pain Management: pt with stump and phantom limb pain   Neurontin 100 mg 3 times a day, oxycodone and Robaxin as needed  4. Mood: Wellbutrin 150 mg daily, Remeron 7.5 mg daily at bedtime  5. Neuropsych: This patient is capable of making decisions on her own behalf.  6. Skin/Wound Care: Routine skin checks   continue vac therapy ~7/14 7. Fluids/Electrolytes/Nutrition: Routine I&Os  BMP WNL on 7/11 8. Acute blood loss anemia. Continue iron supplement.  Hb 9.4 on 7/11  Labs ordered for Monday  Cont to monitor 9. Hypertension.   Norvasc 5 mg daily, increased to 138mon 7/12  Will need further adjustments 10. History of cocaine abuse. Provide counseling  11.History of MRSA PCR screen positive. Contact precautions  12. Constipation. Laxative assistance  13. Hypoalbuminemia  Supplement initiated 7/11  LOS (Days) 3 A FACE TO FACE EVALUATION WAS PERFORMED  Ankit AnLorie Phenix/13/2018 8:14 AM

## 2017-04-16 NOTE — Discharge Instructions (Signed)
Inpatient Rehab Discharge Instructions  [name] Discharge date and time:  04/17/17  Activities/Precautions/ Functional Status: Activity: no lifting, driving, or strenuous exercise for till cleared by MD Diet: regular diet Wound Care: Wash with soap and water. Pat dry and then apply a layer of silvadene. Cover with dry dressing and then the sock. Wash out sock every day. Contact MD if you develop any problems with your incision/wound--redness, swelling, increase in pain, drainage or if you develop fever or chills.    Functional status:  ___ No restrictions     ___ Walk up steps independently ___ 24/7 supervision/assistance   ___ Walk up steps with assistance _X__ Intermittent supervision/assistance  __X_ Bathe/dress independently ___ Walk with walker     ___ Bathe/dress with assistance ___ Walk Independently    ___ Shower independently ___ Walk with assistance    ___ Shower with assistance _X__ No alcohol     ___ Return to work/school ________    COMMUNITY REFERRALS UPON DISCHARGE:   Home Health:   PT     OT     RN  Agency:  [facility] Phone:  [phone] Medical Equipment/Items Ordered:  16"x16" basic cushion; bilateral amputee support pads for w/c; tub transfer bench; 30" transfer board  Agency/Supplier:  [employer]      Phone:  [phone] Other:  You could not receive a drop arm commode because you were issued a bedside commode on 06-05-16 and Medicaid will not pay for another commode.  You can buy one if you want.  GENERAL COMMUNITY RESOURCES FOR PATIENT/FAMILY: Support Groups:  Amputee Support Group of                              2nd Tuesday of the month, 7-8:30 PM                             [facility]                             For information, call [phone] Substance Abuse Resources:  See packet Sonia Baller gave to you and discontinue all illicit drug use.  Special Instructions:   Opioid Overdose Opioids are  substances that relieve pain by binding to pain receptors in your brain and spinal cord. Opioids include illegal drugs, such as heroin, as well as prescription pain medicines.An opioid overdose happens when you take too much of an opioid substance. This can happen with any type of opioid, including:  Heroin.  Morphine.  Codeine.  Methadone.  Oxycodone.  Hydrocodone.  Fentanyl.  Hydromorphone.  Buprenorphine.  The effects of an overdose can be mild, dangerous, or even deadly. Opioid overdose is a medical emergency. What are the causes? This condition may be caused by:  Taking too much of an opioid by accident.  Taking too much of an opioid on purpose.  An error made by a health care provider who prescribes a medicine.  An error made by the pharmacist who fills the prescription order.  Using more than one substance that contains opioids at the same time.  Mixing an opioid with a substance that affects your heart, breathing, or blood pressure. These include alcohol, tranquilizers, sleeping pills, illegal drugs, and some over-the-counter medicines.  What increases the risk? This condition is more likely in:  Children. They may  be attracted to colorful pills. Because of a child's small size, even a small amount of a drug can be dangerous.  Elderly people. They may be taking many different drugs. Elderly people may have difficulty reading labels or remembering when they last took their medicine.  People who take an opioid on a long-term basis.  People who use: ? Illegal drugs. ? Other substances, including alcohol, while using an opioid.  People who have: ? A history of drug or alcohol abuse. ? Certain mental health conditions.  People who take opioids that are not prescribed for them.  What are the signs or symptoms? Symptoms of this condition depend on the type of opioid and the amount that was taken. Common symptoms include:  Sleepiness or difficulty waking  from sleep.  Confusion.  Slurred speech.  Slowed breathing and a slow pulse.  Nausea and vomiting.  Abnormally small pupils.  Signs and symptoms that require emergency treatment include:  Cold, clammy, and pale skin.  Blue lips and fingernails.  Vomiting.  Gurgling sounds in the throat.  A pulse that is very slow or difficult to detect.  Breathing that is very slow, noisy, or difficult to detect.  Limp body.  Inability to respond to speech or be awakened from sleep (stupor).  How is this diagnosed? This condition is diagnosed based on your symptoms. It is important to tell your health care provider:  All of the opioidsthat you took.  When you took the opioids.  Whether you were drinking alcohol or using other substances.  Your health care provider will do a physical exam. This exam may include:  Checking and monitoring your heart rate and rhythm, your breathing rate and depth, your temperature, and your blood pressure (vital signs).  Checking for abnormally small pupils.  Measuring oxygen levels in your blood.  You may also have blood tests or urine tests. How is this treated? Supporting your vital signs and your breathing is the first step in treating an opioid overdose. Treatment may also include:  Giving fluids and minerals (electrolytes) through an IV tube.  Inserting a breathing tube (endotracheal tube) in your airway to help you breathe.  Giving oxygen.  Passing a tube through your nose and into your stomach (NG tube, or nasogastric tube) to wash out your stomach.  Giving medicines that: ? Increase your blood pressure. ? Absorb any opioid that is in your digestive system. ? Reverse the effects of the opioid (naloxone).  Ongoing counseling and mental health support if you intentionally overdosed or used an illegal drug.  Follow these instructions at home:  Take over-the-counter and prescription medicines only as told by your health care  provider. Always ask your health care provider about possible side effects and interactions of any new medicine that you start taking.  Keep a list of all of the medicines that you take, including over-the-counter medicines. Bring this list with you to all of your medical visits.  Drink enough fluid to keep your urine clear or pale yellow.  Keep all follow-up visits as told by your health care provider. This is important. How is this prevented?  Get help if you are struggling with: ? Alcohol or drug use. ? Depression or another mental health problem.  Keep the phone number of your local poison control center near your phone or on your cell phone.  Store all medicines in safety containers that are out of the reach of children.  Read the drug inserts that come with your medicines.  Do not drink alcohol when taking opioids.  Do not use illegal drugs.  Do not take opioid medicines that are not prescribed for you. Contact a health care provider if:  Your symptoms return.  You develop new symptoms or side effects when you are taking medicines. Get help right away if:  You think that you or someone else may have taken too much of an opioid. The hotline of the [facility] is (902)672-6264.  You or someone else is having symptoms of an opioid overdose.  You have serious thoughts about hurting yourself or others.  You have: ? Chest pain. ? Difficulty breathing. ? A loss of consciousness. Opioid overdose is an emergency. Do not wait to see if the symptoms will go away. Get medical help right away. Call your local emergency services (911 in the [place] Do not drive yourself to the hospital. This information is not intended to replace advice given to you by your health care provider. Make sure you discuss any questions you have with your health care provider. Document Released: 10/29/2004 Document Revised: 02/27/2016 Document Reviewed: 03/07/2015 Elsevier Interactive  Patient Education  2018 [employer].    My questions have been answered and I understand these instructions. I will adhere to these goals and the provided educational materials after my discharge from the hospital.  Patient/Caregiver Signature _______________________________ Date __________  Clinician Signature _______________________________________ Date __________  Please bring this form and your medication list with you to all your follow-up doctor's appointments.

## 2017-04-16 NOTE — Progress Notes (Signed)
Social Work [name], female   DOB: 03-24-1961, 56 y.o.   MRN: [id number]   CSW met with pt to update her on team conference discussion and other informal team discussions since that time.  CSW also talked with medical team.  It looks like pt can go home 04-17-17 after OT session so that she can practice bathing since wound VAC is off.  CSW gave pt substance abuse resources and arranged [place] for RN/OT/PT.  Also, ordered cushion for pt's w/c along with bilateral amputee support pads, 30" slide board, and tub transfer bench.  Pt cannot have a drop arm commode because she received one on 06-05-16.  Pt said that was given to her 7 years ago, but [place] checked and pt received it on that date under her Medicaid, so she cannot receive another commode at this time.  CSW is available to assist as needed.

## 2017-04-16 NOTE — Discharge Summary (Signed)
Discharge summary job # [id number]

## 2017-04-16 NOTE — Progress Notes (Signed)
Occupational Therapy Session Note  Patient Details  Name: [name] MRN: [id number] Date of Birth: 05/28/1961  Today's Date: 04/16/2017 OT Individual Time: 0349-6116 OT Individual Time Calculation (min): 45 min    Short Term Goals: Week 1:  OT Short Term Goal 1 (Week 1): STG=LTG 2/2 ELOS   Skilled Therapeutic Interventions/Progress Updates:    Completed ADL retraining at setup- Mod I level.  Pt received asleep in bed, easily aroused.  Pt reports she will typically bathe either at bed level or in shower at home.  Due to wound vac on BLE, engaged in bathing at bed level with setup for items.  Pt able to complete all dressing at Mod I level from bed.  Lateral scoot transfer bed > w/c at Mod I level.  Pt propelled w/c to ADL tubroom.  Pt setup w/c while verbalizing strategies previously learned for safe transfer.  Supervision transfer to tub bench and returned to w/c with only setup to improve positioning of w/c.  Discussed w/c accessibility of bathroom, with practicing through 26" doorway to just allow fit of w/c.  Pt returned to room and left upright in w/c.    Therapy Documentation Precautions:  Precautions Precautions: Fall Precaution Comments: bil BKA  Restrictions Weight Bearing Restrictions: Yes RLE Weight Bearing: Non weight bearing LLE Weight Bearing: Non weight bearing Other Position/Activity Restrictions: Wound vacs B LE's Pain: Pain Assessment Pain Assessment: No/denies pain Pain Score: 6  Pain Type: Surgical pain Pain Location: Leg Pain Orientation: Right;Left Pain Descriptors / Indicators: Aching Pain Intervention(s): Medication (See eMAR) ADL: ADL ADL Comments: see functional navigator   See Function Navigator for Current Functional Status.   Therapy/Group: Individual Therapy  Simonne Come 04/16/2017, 3:02 PM

## 2017-04-16 NOTE — Progress Notes (Signed)
Physical Therapy Discharge Summary  Patient Details  Name: [name] MRN: [id number] Date of Birth: 1960/11/27  Today's Date: 04/16/2017 PT Individual Time: 1130-1200 PT Individual Time Calculation (min): 30 min   Patient has met 8 of 8 long term goals due to improved activity tolerance, improved balance, increased strength, increased range of motion and decreased pain.  Patient to discharge at a wheelchair level Modified Independent.   Patient's care partner is independent to provide the necessary physical assistance at discharge to bump up stairs.  Reasons goals not met: not applicable   Recommendation:  Patient will benefit from ongoing skilled PT services in home health setting to continue to advance safe functional mobility, address ongoing impairments in balance, strength, ROM and minimize fall risk.  Equipment: w/c cushion, amputee pads and slideboard  Reasons for discharge: treatment goals met  Patient/family agrees with progress made and goals achieved: Yes   Therapy Interventions: See treatment note  PT Discharge Precautions/Restrictions Precautions Precautions: Fall Precaution Comments: bil BKA  Restrictions Weight Bearing Restrictions: Yes RLE Weight Bearing: Non weight bearing LLE Weight Bearing: Non weight bearing Pain  Pt denies pain this session.  Cognition Overall Cognitive Status: Within Functional Limits for tasks assessed Arousal/Alertness: Awake/alert Orientation Level: Oriented X4 Attention: Selective Memory: Appears intact Awareness: Appears intact Problem Solving: Appears intact Behaviors: Impulsive Safety/Judgment: Appears intact Sensation Sensation Light Touch: Appears Intact Additional Comments: Pt reports mild decrease appreciation to sensation testing on the RLE compared to the left Coordination Gross Motor Movements are Fluid and Coordinated: Yes Fine Motor Movements are Fluid and Coordinated: Yes Motor  Motor Motor: Within  Functional Limits Motor - Skilled Clinical Observations: generalized weakness  Trunk/Postural Assessment  Cervical Assessment Cervical Assessment: Within Functional Limits Thoracic Assessment Thoracic Assessment: Within Functional Limits Lumbar Assessment Lumbar Assessment: Within Functional Limits Postural Control Postural Control: Within Functional Limits  Balance Balance Balance Assessed: Yes Static Sitting Balance Static Sitting - Level of Assistance: 6: Modified independent (Device/Increase time) Dynamic Sitting Balance Dynamic Sitting - Level of Assistance: 6: Modified independent (Device/Increase time) Dynamic Sitting - Balance Activities: Lateral lean/weight shifting;Forward lean/weight shifting;Reaching across midline Extremity Assessment  RLE Assessment RLE Assessment: Exceptions to [facility] RLE AROM (degrees) RLE Overall AROM Comments: WFL for hip and knee RLE Strength RLE Overall Strength Comments: 4+/5 in hip and 3+/5 knee flexion/extension  LLE Assessment LLE Assessment: Exceptions to WFL LLE AROM (degrees) LLE Overall AROM Comments: WFL for hip and knee LLE Strength LLE Overall Strength Comments: 4+/5 in hip and 3+/5 knee flexion/extension    See Function Navigator for Current Functional Status.  Netta Corrigan, PT, DPT 04/16/2017, 12:25 PM

## 2017-04-16 NOTE — Progress Notes (Signed)
Occupational Therapy Session Note  Patient Details  Name: [name] MRN: [id number] Date of Birth: 1961-06-07  Today's Date: 04/16/2017 OT Individual Time: 1601-0932 OT Individual Time Calculation (min): 45 min    Short Term Goals: Week 1:  OT Short Term Goal 1 (Week 1): STG=LTG 2/2 ELOS   Skilled Therapeutic Interventions/Progress Updates:    1:1. Focus of session on w/c propulsion, UE strengthening, and tub transfers. Pt transfers throughout session with set up EOB<>w/c<>bench<>W/c<>TTB. Notified pt of possible d/c Sunday instead of Saturday  Per CSW plan, and pt becomes tearful and visibly upset. OT provides support and encouragement. With increased time and 1 rest break, pt propels w/c to/from outside courtyard and up/down ramp with supervision. Pt completes 1x15 reps of BUE strengthening in all planes of motion with 5# dowel rod to increase strength required for BADLs and functional transfers. Pt completes TTB shower transfer with set up in prep for d/c home. Exited session with pt in bed with call light in reach and all needs met.   Therapy Documentation Precautions:  Precautions Precautions: Fall Precaution Comments: bil BKA  Restrictions Weight Bearing Restrictions: Yes RLE Weight Bearing: Non weight bearing LLE Weight Bearing: Non weight bearing Other Position/Activity Restrictions: Wound vacs B LE's ADL: ADL ADL Comments: see functional navigator   See Function Navigator for Current Functional Status.   Therapy/Group: Individual Therapy  Tonny Branch 04/16/2017, 3:42 PM

## 2017-04-17 ENCOUNTER — Inpatient Hospital Stay (HOSPITAL_COMMUNITY): Payer: Self-pay

## 2017-04-17 NOTE — Plan of Care (Signed)
Problem: RH Bathing Goal: LTG Patient will bathe with assist, cues/equipment (OT) LTG: Patient will bathe specified number of body parts with assist with/without cues using equipment (position)  (OT)  Outcome: Not Met (add Reason) Pt bathing at bed level requiring set up to gather items  Problem: RH Tub/Shower Transfers Goal: LTG Patient will perform tub/shower transfers w/assist (OT) LTG: Patient will perform tub/shower transfers with assist, with/without cues using equipment (OT)  Outcome: Not Met (add Reason) Pt requiring supervision set up at d/c for tub transfers 2/2 safety  Comments: Pt did not meet bathing goal since pt bathes at bed level requiring set up for gathering items. Recommending supervision for tub shower transfers at d/c 2/2 safety.

## 2017-04-17 NOTE — Discharge Summary (Signed)
[name]                  ACCOUNT NO.:  [id number]  MEDICAL RECORD NO.:  [id number]  LOCATION:  4M09C                        FACILITY:  [facility]  PHYSICIAN:  Delice Lesch, MD        DATE OF BIRTH:  January 29, 1961  DATE OF ADMISSION:  04/13/2017 DATE OF DISCHARGE:  04/17/2017                              DISCHARGE SUMMARY   DISCHARGE DIAGNOSES: 1. 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 2. Subcutaneous heparin for DVT prophylaxis. 3. Pain management. 4. Acute blood loss anemia. 5. Hypertension. 6. History of cocaine abuse. 7. History of the MRSA PCR screening positive. 8. Constipation. 9. Decreased nutritional storage.  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  She lives in a local boarding house.  She used an IT trainer wheelchair prior to admission and a walker for short distances.  Presented on April 08, 2017, with increasing leg pain, foul-smelling ulcerations of lower extremities with exposed bone and necrotic tissue.  Underwent bilateral transtibial amputation on April 10, 2017, per Dr. Sharol Given.  Hospital course, pain management.  Acute blood loss anemia, she was transfused.  Subcutaneous heparin for DVT prophylaxis.  Physical and occupational therapy ongoing.  The patient was admitted for a comprehensive rehab program.  PAST MEDICAL HISTORY:  See discharge diagnoses.  SOCIAL HISTORY:  She lives in a local boarding home with assistance from her aunt and brother.  FUNCTIONAL STATUS UPON ADMISSION TO REHAB SERVICES:  Supervision supine to sit, supervision sit to supine, minimal assist anterior-posterior transfers, min to mod assist activities of daily living.  PHYSICAL EXAMINATION:  VITAL SIGNS:  Blood pressure 149/84, pulse 86, temperature 98, and  respirations 18. GENERAL:  This was an alert female, in no acute distress, oriented x3. HEENT:  EOMs intact. NECK:  Supple, nontender.  No JVD. CARDIAC:  Rate controlled. ABDOMEN:  Soft and nontender.  Good bowel sounds. LUNGS:  Clear to auscultation without wheeze. EXTREMITIES:  Bilateral transtibial amputation site is dressed with wound VAC in place.  REHABILITATION HOSPITAL COURSE:  The patient was admitted to Inpatient Rehab Services and therapies initiated on a 3-hour daily basis, consisting of physical therapy, occupational therapy, and rehabilitation nursing.  The following issues were addressed during the patient's rehabilitation stay.  Pertaining to Mrs. Mcglory's bilateral transtibial amputations, wound VAC in place, discontinued on the day of discharge, site healing nicely.  She would follow up with Dr. Sharol Given.  Subcutaneous heparin for DVT prophylaxis.  No bleeding episodes.  Pain management with use of Neurontin 100 mg t.i.d. as well as oxycodone, Robaxin for breakthrough pain and spasms.  She continued on Wellbutrin as well as Remeron for history of depression.  Emotional support provided.  She was attending full therapies.  Acute blood loss anemia stable, latest hemoglobin 9.4.  Blood pressures controlled with low-dose Norvasc, increased to 10 mg on April 15, 2017.  She would follow up with her primary MD.  She did have a history of cocaine abuse, provided counseling in regard to cessation of all listed drug products.  It was questionable if she would be compliant with these requests.  She remained on contact precautions for history of MRSA PCR screening positive.  The patient received weekly collaborative interdisciplinary team conferences to discuss estimated length of stay, family teaching, any barriers to her discharge.  She transferred bed to wheelchair modified independent using a lateral scoot transfers, propel her wheelchair modified independent.  She had ongoing  family education with her aunt.  The patient explained how to perform stair bumping with wheelchair demonstration.  Aunt performed stair bumping x4 steps with an empty wheelchair and then again with the patient in wheelchair.  She could sit edge of bed, wheelchair transfer to bench supervision, gather belongings for activities of daily living and homemaking, up and down a ramp with supervision.  Full family teaching completed, plan discharge to home.  DISCHARGE MEDICATIONS: 1. Norvasc 10 mg p.o. daily. 2. Wellbutrin 150 mg p.o. daily. 3. Colace 100 mg p.o. b.i.d. 4. Ferrous sulfate 325 mg p.o. b.i.d. 5. Neurontin 100 mg p.o. t.i.d. 6. Remeron 7.5 mg p.o. at bedtime. 7. Multivitamin daily. 8. NicoDerm patch, taper as directed. 9. Robaxin 500 mg p.o. every 6 hours as needed pain. 10.Oxycodone, dispense of 15 tablets, 5 mg every 6 hours as needed     pain.  DIET:  Regular.  SPECIAL INSTRUCTIONS:  No smoking.  No alcohol.  No illicit drug use. The patient would follow up with Dr. Delice Lesch at the Outpatient Rehab Service as directed; Dr. Meridee Score, call for appointment, Molli Barrows, nurse practitioner, Medical Management.     Lauraine Rinne, P.A.   ______________________________ Delice Lesch, MD    DA/MEDQ  D:  04/16/2017  T:  04/17/2017  Job:  [id number]  cc:   Molli Barrows, FNP Newt Minion, MD Delice Lesch, MD

## 2017-04-17 NOTE — Progress Notes (Signed)
Occupational Therapy Discharge Summary  Patient Details  Name: MALLEY HAUTER MRN: [id number] Date of Birth: 29-Apr-1961  Today's Date: 04/17/2017 OT Missed Time:   60 min Missed Time Reason:   Pt missed 60 min skilled OT as pt left/discharged hospital prior to OT session.    Patient has met 6 of 8 long term goals due to improved activity tolerance, improved balance and ability to compensate for deficits.  Patient to discharge at overall Modified Independent -Set up level.  Patient's care partner was not present for any OT sessions and pt discharged prior to final OT session to practice bathing without wound vacs on BLE. Pt will require set up assistance for bathing at discharge.    Reasons goals not met: Pt at EOB requires set up to gather needed items for bathing  Recommendation:  Patient will benefit from ongoing skilled OT services in home health setting to continue to advance functional skills in the area of BADL and iADL.  Equipment: TTB  Reasons for discharge: treatment goals met and discharge from hospital  Patient/family agrees with progress made and goals achieved: Yes  OT Discharge Precautions/Restrictions  Precautions Precautions: Fall Precaution Comments: bil BKA  Restrictions Weight Bearing Restrictions: Yes RLE Weight Bearing: Non weight bearing LLE Weight Bearing: Non weight bearing Other Position/Activity Restrictions: Wound vacs B LE's General   Vital Signs  Pain   ADL ADL ADL Comments: see functional navigator  Vision Baseline Vision/History: No visual deficits Patient Visual Report: No change from baseline Vision Assessment?: No apparent visual deficits Perception  Perception: Within Functional Limits Praxis Praxis: Intact Cognition Overall Cognitive Status: Within Functional Limits for tasks assessed Arousal/Alertness: Awake/alert Orientation Level: Oriented X4 Attention: Selective Memory: Appears intact Awareness: Appears intact Problem  Solving: Appears intact Executive Function: Self Correcting Self Correcting: Appears intact Sensation Sensation Light Touch: Appears Intact Proprioception: Appears Intact Coordination Gross Motor Movements are Fluid and Coordinated: Yes Fine Motor Movements are Fluid and Coordinated: Yes Motor  Motor Motor: Within Functional Limits Motor - Skilled Clinical Observations: generalized weakness Mobility  Transfers Transfers:  (ant/post or lateral scoot transfers MOD I)  Trunk/Postural Assessment  Cervical Assessment Cervical Assessment: Within Functional Limits Thoracic Assessment Thoracic Assessment: Within Functional Limits Lumbar Assessment Lumbar Assessment: Within Functional Limits Postural Control Postural Control: Within Functional Limits  Balance Balance Balance Assessed: Yes Static Sitting Balance Static Sitting - Level of Assistance: 7: Independent Dynamic Sitting Balance Dynamic Sitting - Level of Assistance: 6: Modified independent (Device/Increase time) Extremity/Trunk Assessment RUE Assessment RUE Assessment: Within Functional Limits LUE Assessment LUE Assessment: Within Functional Limits   See Function Navigator for Current Functional Status.  Lowella Dell Hanae Waiters 04/17/2017, 10:13 AM

## 2017-04-17 NOTE — Progress Notes (Signed)
Social Work Discharge Note  The overall goal for the admission was met for:   Discharge location: Yes - home to boarding house  Length of Stay: Yes - 4 days  Discharge activity level: Yes  Home/community participation: Yes  Services provided included: MD, RD, PT, OT, RN, Pharmacy and SW  Financial Services: Medicaid  Follow-up services arranged: Home Health: PT/OT/RN with [facility], DME: 16"x16" basic cushion for pt's w/c; bilateral amputee support pads; 30" slide board; tub transfer bench from [facility] and Patient/Family has no preference for HH/DME agencies  Comments (or additional information): Pt will have support from her aunt and brother, as needed.  Aunt received family education and brother is working on ramp to enter home. Pt cannot receive a drop arm commode due to receiving a BSC 06-05-16.  Modifications were made to her w/c.  Pt told to not use illicit drugs and substance abuse resources were given.  Patient/Family verbalized understanding of follow-up arrangements: Yes  Individual responsible for coordination of the follow-up plan: pt with her aunt and brother  Confirmed correct DME delivered: Trey Sailors 04/17/2017    Anaia Frith, Silvestre Mesi

## 2017-04-21 ENCOUNTER — Ambulatory Visit: Payer: Self-pay | Admitting: Family Medicine

## 2017-04-21 ENCOUNTER — Telehealth (INDEPENDENT_AMBULATORY_CARE_PROVIDER_SITE_OTHER): Payer: Self-pay | Admitting: Orthopedic Surgery

## 2017-04-21 NOTE — Telephone Encounter (Signed)
Noted  

## 2017-04-21 NOTE — Telephone Encounter (Signed)
[facility] CALLED TO ADVISE THEIR RN WILL BE STARTING HH TOMORROW.  [phone]

## 2017-04-23 NOTE — Telephone Encounter (Signed)
Note opened in error.

## 2017-04-27 ENCOUNTER — Encounter: Payer: Self-pay | Admitting: Family Medicine

## 2017-04-27 ENCOUNTER — Telehealth: Payer: Self-pay | Admitting: Physical Medicine & Rehabilitation

## 2017-04-27 ENCOUNTER — Ambulatory Visit (INDEPENDENT_AMBULATORY_CARE_PROVIDER_SITE_OTHER): Payer: Medicaid Other | Admitting: Family Medicine

## 2017-04-27 VITALS — BP 118/62 | HR 84 | Temp 98.1°F | Resp 16 | Ht 61.0 in

## 2017-04-27 DIAGNOSIS — F332 Major depressive disorder, recurrent severe without psychotic features: Secondary | ICD-10-CM | POA: Diagnosis not present

## 2017-04-27 DIAGNOSIS — Z720 Tobacco use: Secondary | ICD-10-CM

## 2017-04-27 DIAGNOSIS — I10 Essential (primary) hypertension: Secondary | ICD-10-CM | POA: Diagnosis not present

## 2017-04-27 DIAGNOSIS — F1419 Cocaine abuse with unspecified cocaine-induced disorder: Secondary | ICD-10-CM | POA: Diagnosis not present

## 2017-04-27 DIAGNOSIS — Z89619 Acquired absence of unspecified leg above knee: Secondary | ICD-10-CM

## 2017-04-27 DIAGNOSIS — S88919A Complete traumatic amputation of unspecified lower leg, level unspecified, initial encounter: Secondary | ICD-10-CM

## 2017-04-27 LAB — COMPLETE METABOLIC PANEL WITH GFR
ALBUMIN: 3.6 g/dL (ref 3.6–5.1)
ALK PHOS: 78 U/L (ref 33–130)
ALT: 10 U/L (ref 6–29)
AST: 11 U/L (ref 10–35)
BUN: 14 mg/dL (ref 7–25)
CALCIUM: 9.1 mg/dL (ref 8.6–10.4)
CO2: 21 mmol/L (ref 20–31)
Chloride: 108 mmol/L (ref 98–110)
Creat: 0.94 mg/dL (ref 0.50–1.05)
GFR, EST NON AFRICAN AMERICAN: 69 mL/min (ref >=60)
GFR, Est African American: 80 mL/min (ref >=60)
Glucose, Bld: 104 mg/dL — ABNORMAL HIGH (ref 65–99)
POTASSIUM: 3.6 mmol/L (ref 3.5–5.3)
Sodium: 141 mmol/L (ref 135–146)
Total Bilirubin: 0.2 mg/dL (ref 0.2–1.2)
Total Protein: 6.3 g/dL (ref 6.1–8.1)

## 2017-04-27 LAB — CBC WITH DIFFERENTIAL/PLATELET
BASOS ABS: 0 {cells}/uL (ref 0–200)
Basophils Relative: 0 %
Eosinophils Absolute: 240 cells/uL (ref 15–500)
Eosinophils Relative: 4 %
HEMATOCRIT: 33.2 % — AB (ref 35.0–45.0)
HEMOGLOBIN: 10.9 g/dL — AB (ref 11.7–15.5)
LYMPHS ABS: 1740 {cells}/uL (ref 850–3900)
Lymphocytes Relative: 29 %
MCH: 27.3 pg (ref 27.0–33.0)
MCHC: 32.8 g/dL (ref 32.0–36.0)
MCV: 83 fL (ref 80.0–100.0)
MONO ABS: 300 {cells}/uL (ref 200–950)
MPV: 9.9 fL (ref 7.5–12.5)
Monocytes Relative: 5 %
NEUTROS PCT: 62 %
Neutro Abs: 3720 cells/uL (ref 1500–7800)
Platelets: 440 10*3/uL — ABNORMAL HIGH (ref 140–400)
RBC: 4 MIL/uL (ref 3.80–5.10)
RDW: 17.6 % — ABNORMAL HIGH (ref 11.0–15.0)
WBC: 6 10*3/uL (ref 3.8–10.8)

## 2017-04-27 MED ORDER — GABAPENTIN 100 MG PO CAPS: 100.0000 mg | ORAL_CAPSULE | Freq: Three times a day (TID) | ORAL | 3 refills | Status: DC

## 2017-04-27 NOTE — Progress Notes (Signed)
[name], female    DOB: [birth year], 57 y.o.   MRN: [id number]  PCP: Scot Jun, FNP  Chief Complaint  Patient presents with  . Establish Care  . Hospitalization Follow-up    Subjective:  HPI  [name] is a 56 y.o. female presents to establish care and for hospital follow-up s/p bilateral below the knee amputation. Medical problems include a long history of cocaine dependence, depression, anxiety. [name] was admitted to the hospital on 04/08/2017 where she underwent a bilateral below the knee amputation related to cocaine-induced vasculitis with gangrene he has changes involving the muscle. She has been noncompliant previously with wound care follow-up and antibiotic treatment therefore resulting in nonhealing of previously diagnosed ulceration of her bilateral legs. She was discharged home on 04/13/2017 and is currently receiving in-home health and physical therapy. She reports that physically she has been doing well. Her pain has been at a minimum and she gets great relief from phantom leg pain with gabapentin 100 mg 3 times a day. [name] admits that she has been significantly depressed since loosing her legs. She is trying to adjust and realize that the circumstances surrounding her health are a result of life choices. [name] is displaced from her family and reports a few supportive friends in the area. She is requesting assistance with transportation through [employer] and requests completion of the application. [name] confirms that at present she is not using cocaine and trying really hard to stop smoking cigarettes. She has been wearing nicotine patches since being discharged from the hospital and she admits that she is now smoking less than 10 cigarette per day. Social History   Social History  . Marital status: Single    Spouse name: N/A  . Number of children: 0  . Years of education: 11th grade   Occupational History  . Disability     since 2011, due to her  rheumatoid arthritis   Social History Main Topics  . Smoking status: Current Every Day Smoker    Packs/day: 0.12    Years: 38.00    Types: Cigarettes  . Smokeless tobacco: Never Used     Comment: 2 A DAY  . Alcohol use No  . Drug use: Yes    Types: "Crack" cocaine, Cocaine     Comment: 07/29/2016 "only use the one you smoke now., last used ~ 1-2 wk ago"  . Sexual activity: Not Currently   Other Topics Concern  . Not on file   Social History Narrative   Unemployed:  cleaning in past   Living at [place]; has Medicaid.   crack/cocaine use; pt denies IVDU   tobacco:  1/2 ppd, trying to quit   alcohol:  none        Family History  Problem Relation Age of Onset  . Breast cancer Mother        Breast cancer  . Alcohol abuse Mother   . Colon cancer Maternal Aunt 82  . Alcohol abuse Father    Review of Systems See HPI Patient Active Problem List   Diagnosis Date Noted  . Hypertensive crisis   . Abnormality of gait   . Phantom limb pain ([place])   . S/P bilateral below knee amputation ([place]) 04/13/2017  . Tobacco abuse   . Benign essential HTN   . Post-operative pain   . Acute blood loss anemia   . 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  Atherosclerosis of native arteries of extremities with gangrene, right leg ([place])   . Wound infection 03/30/2017  . AKI (acute kidney injury) ([place]) 02/07/2017  . Acute kidney injury ([place]) 02/06/2017  . Wound healing, delayed   . Osteomyelitis ([place])   . Ulcer with gangrene, with necrosis of muscle ([place])   . Skin ulcer of lower leg with necrosis of muscle, unspecified laterality ([place]) 06/04/2015  . Protein-calorie malnutrition, severe ([place]) 01/15/2015  . MDD (major depressive disorder), recurrent episode, severe ([place]) 10/16/2014  . Cocaine use disorder, severe, dependence ([place]) 10/10/2014  . 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 Essential hypertension 02/26/2010     Allergies  Allergen Reactions  . Acetaminophen Swelling and Other (See Comments)    Reaction:  Eyelid swelling  . Other Other (See Comments)    Pt states she is allergic to a blood pressure medication, but does not know the reaction or medication    Prior to Admission medications   Medication Sig Start Date End Date Taking? Authorizing Provider  amLODipine (NORVASC) 10 MG tablet Take 1 tablet (10 mg total) by mouth daily. 04/16/17  Yes Love, Ivan Anchors, PA-C  buPROPion (WELLBUTRIN XL) 150 MG 24 hr tablet Take 1 tablet (150 mg total) by mouth daily. 04/16/17  Yes Love, Ivan Anchors, PA-C  docusate sodium (COLACE) 100 MG capsule Take 1 capsule (100 mg total) by mouth 2 (two) times daily. 04/16/17  Yes Love, Ivan Anchors, PA-C  ferrous sulfate 325 (65 FE) MG tablet Take 1 tablet (325 mg total) by mouth 2 (two) times daily with a meal. 04/16/17  Yes Love, Ivan Anchors, PA-C  gabapentin (NEURONTIN) 100 MG capsule Take 1 capsule (100 mg total) by mouth 3 (three) times daily. 04/16/17  Yes Love, Ivan Anchors, PA-C  methocarbamol (ROBAXIN) 500 MG tablet Take 1 tablet (500 mg total) by mouth every 6 (six) hours as needed for muscle spasms. 04/16/17  Yes Love, Ivan Anchors, PA-C  mirtazapine (REMERON) 7.5 MG tablet Take 1 tablet (7.5 mg total) by mouth at bedtime. 04/16/17  Yes Love, Ivan Anchors, PA-C  Multiple Vitamin (MULTIVITAMIN WITH MINERALS) TABS tablet Take 1 tablet by mouth daily. 04/17/17  Yes Love, Ivan Anchors, PA-C  nicotine (NICODERM CQ - DOSED IN MG/24 HR) 7 mg/24hr patch Place 1 patch (7 mg total) onto the skin daily. 04/17/17  Yes Love, Ivan Anchors, PA-C  oxyCODONE (OXY IR/ROXICODONE) 5 MG immediate release tablet Take 1 tablet (5 mg total) by mouth every 12 (twelve) hours as needed for breakthrough pain. 04/16/17  Yes Love, Ivan Anchors, PA-C  polyethylene glycol (MIRALAX / GLYCOLAX) packet Take 17 g by mouth daily as needed for mild constipation. 04/13/17  Yes Waldron Session, MD  silver sulfADIAZINE (SILVADENE) 1 % cream Apply  topically daily. 04/17/17  Yes Love, Ivan Anchors, PA-C    Past Medical, Surgical Family and Social History reviewed and updated.    Objective:   Today's Vitals   04/27/17 1432  BP: 118/62  Pulse: 84  Resp: 16  Temp: 98.1 F (36.7 C)  TempSrc: Oral  SpO2: 100%  Height: 5' 1"  (1.549 m)    Wt Readings from Last 3 Encounters:  04/15/17 85 lb 5.1 oz (38.7 kg)  04/13/17 105 lb 1.6 oz (47.7 kg)  03/30/17 78 lb 6.4 oz (35.6 kg)   Physical Exam  Constitutional: She is oriented to person, place, and time. She appears well-developed and well-nourished.  HENT:  Head: Normocephalic and atraumatic.  Eyes: Pupils are equal, round, and  reactive to light. Conjunctivae are normal.  Neck: Normal range of motion. Neck supple.  Cardiovascular: Normal rate, regular rhythm, normal heart sounds and intact distal pulses.   Pulmonary/Chest: Effort normal and breath sounds normal.  Musculoskeletal:  Trace edema bilateral lower thighs. Dressings are covering the stumps which are dry and intact.  Neurological: She is alert and oriented to person, place, and time.  Skin: Skin is warm and dry.  Psychiatric: She has a normal mood and affect. Her behavior is normal. Judgment and thought content normal.    Assessment & Plan:  1. Amputation of leg (HCC) - CBC with Differential - COMPLETE METABOLIC PANEL WITH GFR -Refilled gabapentin 100 mg 3 times a day as needed for phantom leg pain.  2. Severe episode of recurrent major depressive disorder, without psychotic features ([place]) - Ambulatory referral to Psychiatry  -Continue Remeron and Wellbutrin as prescribed.  3. Essential hypertension, well controlled today. -Continue Amlodipine 10 mg once daily  4. Tobacco abuse -Continue nicotine patches and educated that she should not smoke when nicotine patches are applied. -Continue Wellbutrin as prescribed.  5. Cocaine abuse with cocaine-induced disorder [facility]) - Ambulatory referral to Psychiatry -Continue  Remeron and Wellbutrin as prescribed.  RTC: 2 months, chronic conditions-fasting labs, Hemoglobin A1C, Smoking Cessation. [employer] transportation application form completed and mailed to patient.  Carroll Sage. Kenton Kingfisher, MSN, FNP-C [facility]  [address] [place] [place], [place] [address] [phone]

## 2017-04-27 NOTE — Telephone Encounter (Signed)
Izora Gala OT with [facility] needs to get verbal orders to see patient 2w1 for tub transfers and bathing techniquesp.  Please call her at [phone].

## 2017-04-28 NOTE — Telephone Encounter (Signed)
Verbal approval given.

## 2017-05-02 MED ORDER — MIRTAZAPINE 7.5 MG PO TABS: 7.5000 mg | ORAL_TABLET | Freq: Every day | ORAL | 2 refills | Status: DC

## 2017-05-02 MED ORDER — DOCUSATE SODIUM 100 MG PO CAPS: 100.0000 mg | ORAL_CAPSULE | Freq: Two times a day (BID) | ORAL | 1 refills | Status: DC

## 2017-05-02 MED ORDER — FERROUS SULFATE 325 (65 FE) MG PO TABS: 325.0000 mg | ORAL_TABLET | Freq: Two times a day (BID) | ORAL | 0 refills | Status: DC

## 2017-05-02 MED ORDER — POLYETHYLENE GLYCOL 3350 17 G PO PACK: 17.0000 g | PACK | Freq: Every day | ORAL | 2 refills | Status: DC | PRN

## 2017-05-02 MED ORDER — METHOCARBAMOL 500 MG PO TABS: 500.0000 mg | ORAL_TABLET | Freq: Four times a day (QID) | ORAL | 1 refills | Status: DC | PRN

## 2017-05-02 MED ORDER — NICOTINE 7 MG/24HR TD PT24: 7.0000 mg | MEDICATED_PATCH | Freq: Every day | TRANSDERMAL | 2 refills | Status: DC

## 2017-05-02 MED ORDER — BUPROPION HCL ER (XL) 150 MG PO TB24: 150.0000 mg | ORAL_TABLET | Freq: Every day | ORAL | 2 refills | Status: DC

## 2017-05-02 MED ORDER — AMLODIPINE BESYLATE 10 MG PO TABS: 10.0000 mg | ORAL_TABLET | Freq: Every day | ORAL | 2 refills | Status: DC

## 2017-05-02 MED ORDER — ADULT MULTIVITAMIN W/MINERALS CH: 1.0000 | ORAL_TABLET | Freq: Every day | ORAL | 0 refills | Status: DC

## 2017-05-05 ENCOUNTER — Ambulatory Visit (INDEPENDENT_AMBULATORY_CARE_PROVIDER_SITE_OTHER): Payer: Medicaid Other | Admitting: Family

## 2017-05-07 ENCOUNTER — Ambulatory Visit (INDEPENDENT_AMBULATORY_CARE_PROVIDER_SITE_OTHER): Payer: Medicaid Other | Admitting: Family

## 2017-05-10 ENCOUNTER — Ambulatory Visit (INDEPENDENT_AMBULATORY_CARE_PROVIDER_SITE_OTHER): Payer: Medicaid Other | Admitting: Family

## 2017-05-10 DIAGNOSIS — Z89511 Acquired absence of right leg below knee: Secondary | ICD-10-CM

## 2017-05-10 DIAGNOSIS — Z89512 Acquired absence of left leg below knee: Secondary | ICD-10-CM

## 2017-05-10 NOTE — Progress Notes (Signed)
Post-Op Visit Note   Patient: [name]           Date of Birth: 1961/05/22           MRN: [id number] Visit Date: 05/10/2017 PCP: Scot Jun, FNP  Chief Complaint:  Chief Complaint  Patient presents with  . Right Knee - Routine Post Op  . Left Knee - Routine Post Op    HPI:  Patient is a 56 year old woman seen 4 weeks status post bilateral below knee amputations.    Ortho Exam  Incisions are healing well bilaterally. No erythema, drainage, odor or sign of infection. Healing well.  Visit Diagnoses:  1. S/P bilateral below knee amputation ([place])     Plan: Staples harvested today. Given an order for prostheses to [employer]. Follow up in office in 2 weeks.   Follow-Up Instructions: Return in about 2 weeks (around 05/24/2017).   Imaging: No results found.  Orders:  No orders of the defined types were placed in this encounter.  No orders of the defined types were placed in this encounter.    PMFS History: Patient Active Problem List   Diagnosis Date Noted  . Hypertensive crisis   . Phantom limb pain ([place])   . S/P bilateral below knee amputation ([place]) 04/13/2017  . Tobacco abuse   . Post-operative pain   . Acute blood loss anemia   . 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 Wound infection 03/30/2017  . AKI (acute kidney injury) ([place]) 02/07/2017  . Acute kidney injury ([place]) 02/06/2017  . Wound healing, delayed   . Osteomyelitis ([place])   . Ulcer with gangrene, with necrosis of muscle ([place])   . Skin ulcer of lower leg with necrosis of muscle, unspecified laterality ([place]) 06/04/2015  . Protein-calorie malnutrition, severe ([place]) 01/15/2015  . MDD (major depressive disorder), recurrent episode, severe ([place]) 10/16/2014  . Cocaine use disorder, severe, dependence ([place]) 10/10/2014  . 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 Essential hypertension 02/26/2010   Past Medical History:  Diagnosis Date  . CAP  (community acquired pneumonia) 03/2014 X 2  . Cocaine abuse    ongoing with resultant vaculitis.  . Depression   . Headache    "weekly" (07/29/2016)  . Hypertension   . Inflammatory arthritis   . Migraines    "probably 5-6/yr" (07/29/2016)  . Normocytic anemia    BL Hgb 9.8-12. Last anemia panel 04/2010 - showing Fe 19, ferritin 101.  Pt on monthly B12 injections  . Rheumatoid arthritis(714.0)    patient reported  . 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 Followed by Dr. Louanne Skye    Family History  Problem Relation Age of Onset  . Breast cancer Mother        Breast cancer  . Alcohol abuse Mother   . Colon cancer Maternal Aunt 14  . Alcohol abuse Father     Past Surgical History:  Procedure Laterality Date  . AMPUTATION Left 05/22/2016   Procedure: AMPUTATION LEFT LONG FINGER;  Surgeon: Marybelle Killings, MD;  Location: [facility];  Service: Orthopedics;  Laterality: Left;  . AMPUTATION Bilateral 04/10/2017   Procedure: AMPUTATION BELOW KNEE;  Surgeon: Newt Minion, MD;  Location: [facility];  Service: Orthopedics;  Laterality: Bilateral;  . HERNIA REPAIR     "stomach"  . I&D EXTREMITY Right 09/26/2015   Procedure: IRRIGATION AND DEBRIDEMENT LEG WOUND  [place].;  Surgeon: Loel Lofty Dillingham, DO;  Location: [facility];  Service: Plastics;  Laterality: Right;  . INCISION AND DRAINAGE OF WOUND Bilateral 10/20/2016   Procedure: IRRIGATION AND DEBRIDEMENT WOUND BILATERAL;  Surgeon: Edrick Kins, DPM;  Location: [facility];  Service: Podiatry;  Laterality: Bilateral;  . IRRIGATION AND DEBRIDEMENT ABSCESS Bilateral 09/26/2013   Procedure: DEBRIDEMENT ULCERS BILATERAL THIGHS;  Surgeon: Gwenyth Ober, MD;  Location: [facility];  Service: General;  Laterality: Bilateral;  . SKIN BIOPSY Bilateral    shin nodules   Social History   Occupational History  . Disability     since 2011, due to her rheumatoid arthritis   Social History Main Topics  . Smoking  status: Current Every Day Smoker    Packs/day: 0.12    Years: 38.00    Types: Cigarettes  . Smokeless tobacco: Never Used     Comment: 2 A DAY  . Alcohol use No  . Drug use: Yes    Types: "Crack" cocaine, Cocaine     Comment: 07/29/2016 "only use the one you smoke now., last used ~ 1-2 wk ago"  . Sexual activity: Not Currently

## 2017-05-18 IMAGING — CR DG TIBIA/FIBULA 2V*L*
3 series · 3 of 3 positions shown · non-contrast
Comparison: 03/09/2015

CLINICAL DATA: Left lower extremity wound.

EXAM:
LEFT TIBIA AND FIBULA - 2 VIEW

[x tib-fib ap left]
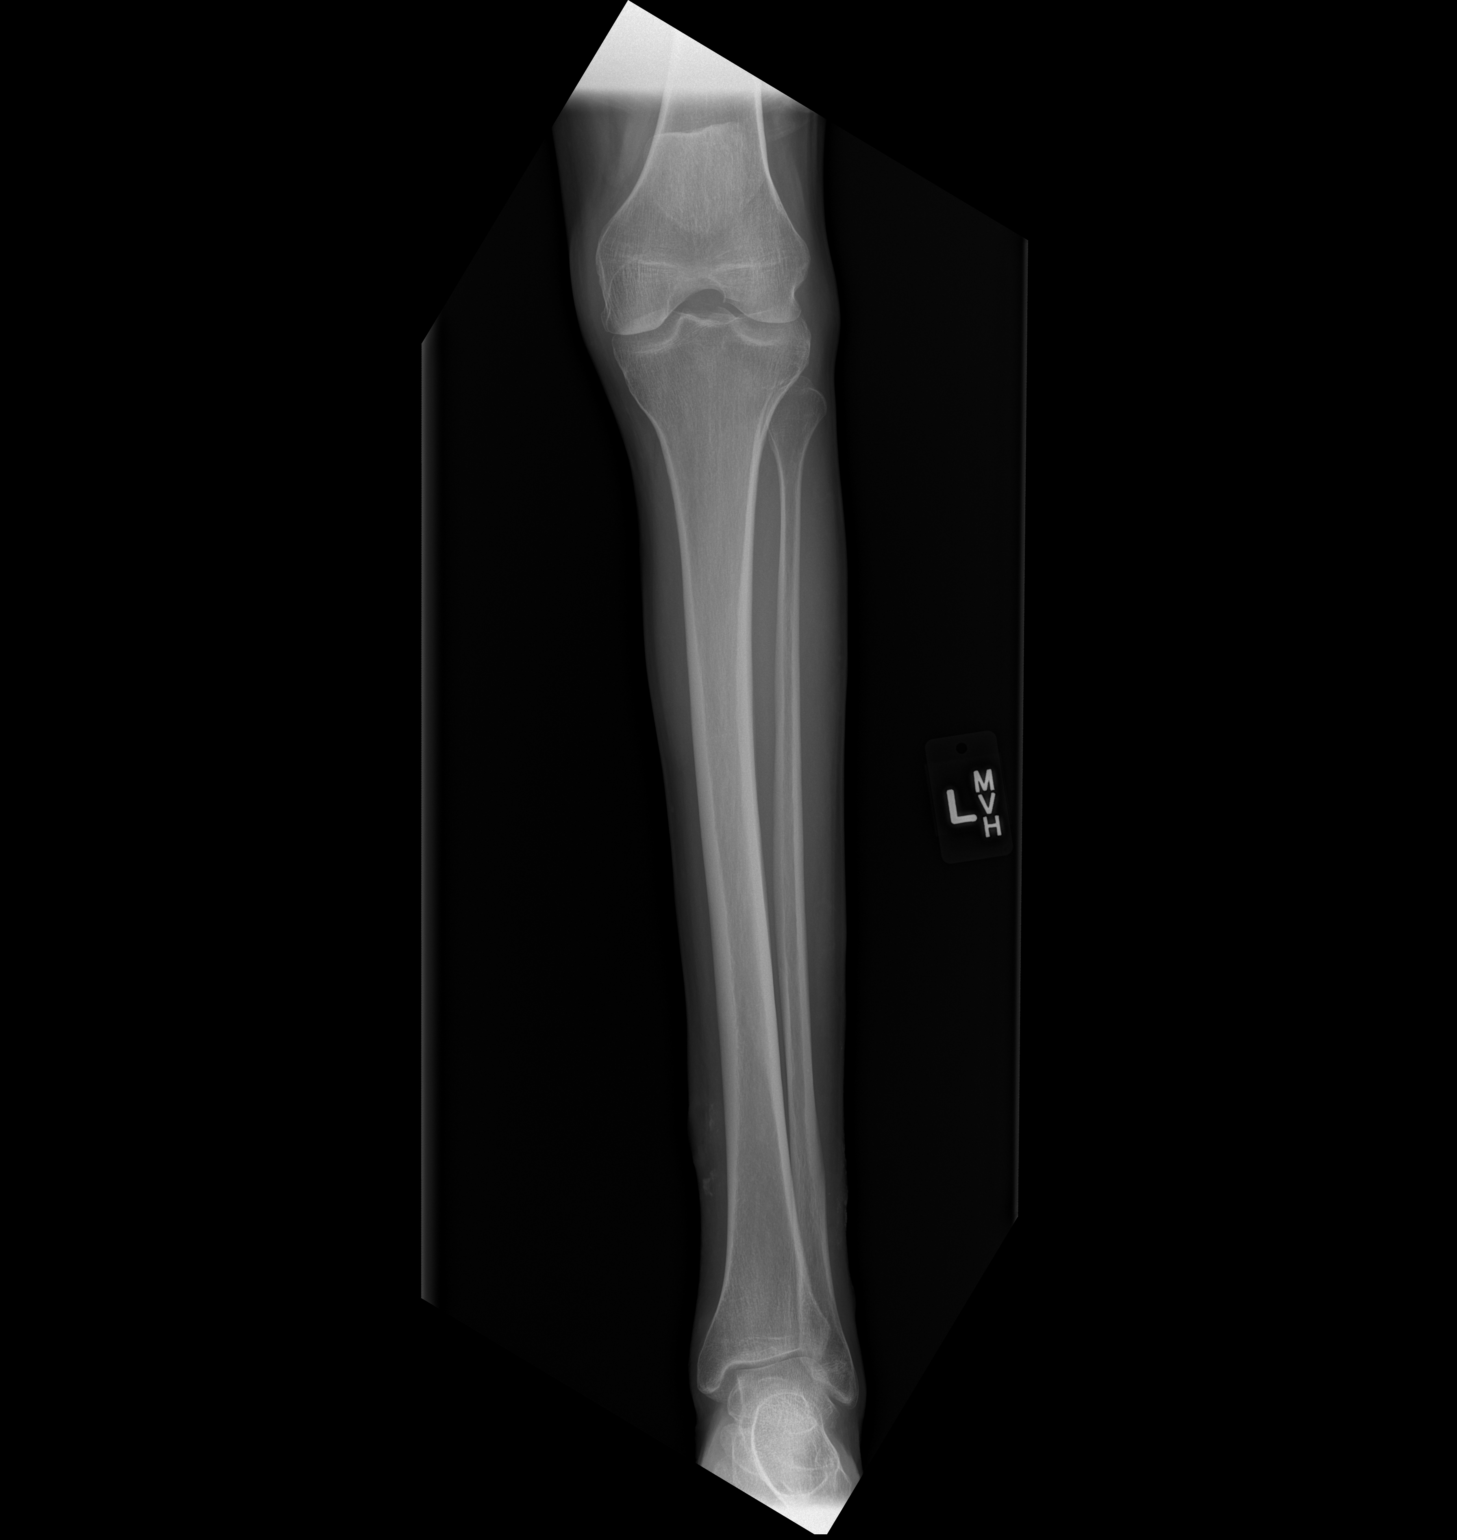

[x tib-fib lat left (1 of 2)]
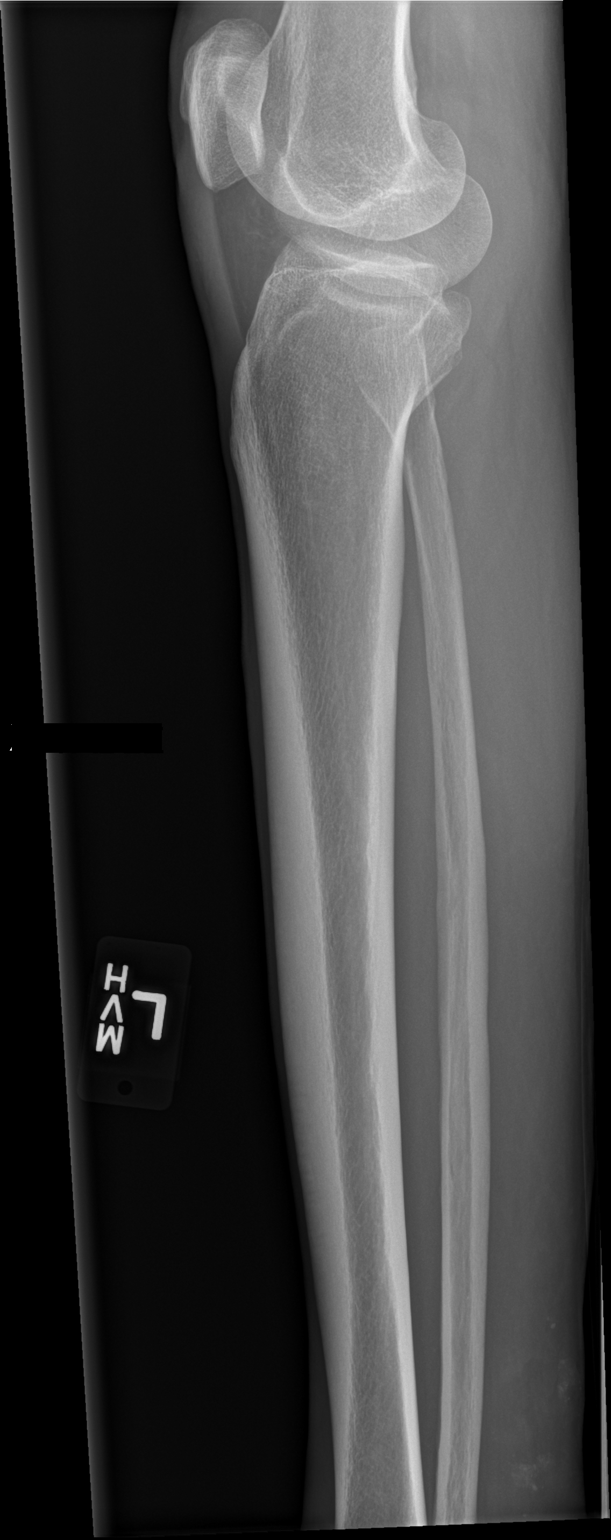

[x tib-fib lat left (2 of 2)]
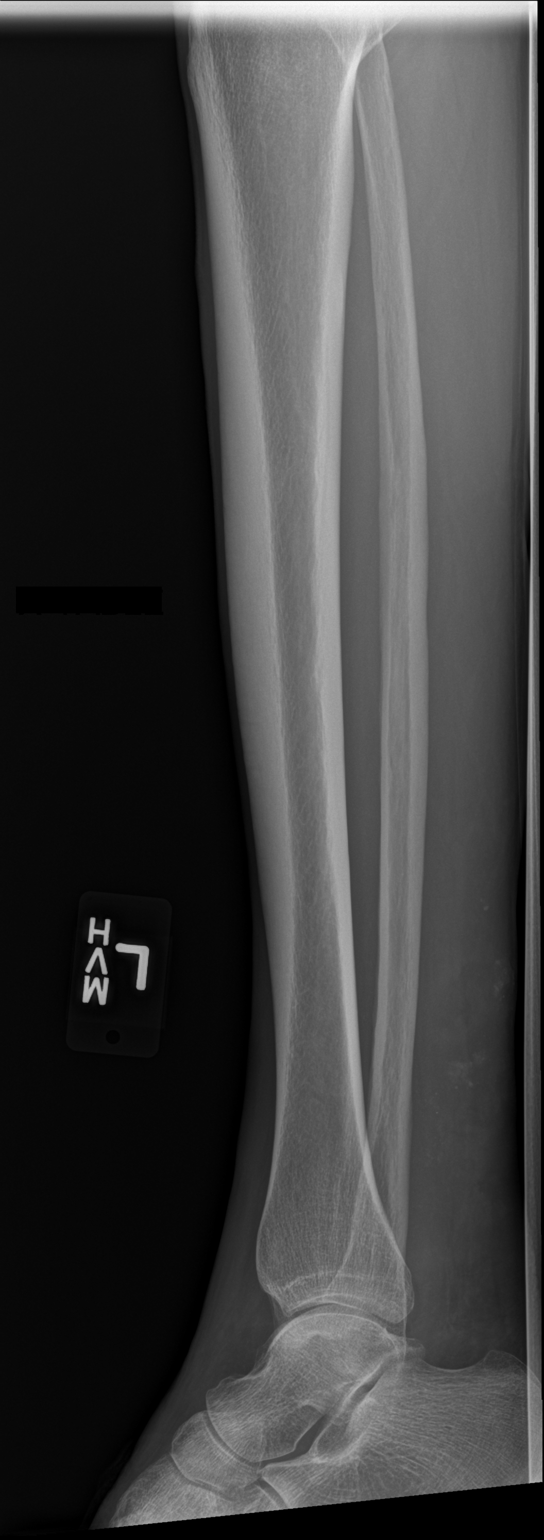

[3 of 3 positions shown; findings below may reference images not displayed]

FINDINGS: High-density material around skin thickening and ulceration on the
lower leg could reflect calcification or superficial
debris/ointment. No evidence of osseous infection or soft tissue
gas. No acute fracture or malalignment.
IMPRESSION: Lower leg ulcers with debris/ointment or calcification. No soft
tissue gas or osseous abnormality.

## 2017-05-18 IMAGING — MR MR [PERSON_NAME] LOW W/O CM*L*
4 of 6 series · 19 of 40 positions shown · non-contrast
Comparison: Radiographs 06/04/2015. MRI of the lower legs
04/21/2010 and of the left foot 03/09/2015.

CLINICAL DATA: Increasing left lower leg pain, difficulty walking
and nonhealing wound. History of drug abuse and vasculitis.

EXAM:
MRI OF LOWER LEFT EXTREMITY WITHOUT CONTRAST
TECHNIQUE: Multiplanar, multisequence MR imaging of the left lower leg was
performed. No intravenous contrast was administered.

[Series 3: T1 · axial · 5.0mm · 0.57mm/px · z∈[-139,+199]mm · 4 of 59 slices shown (1 of 3)]
[im 1/59]
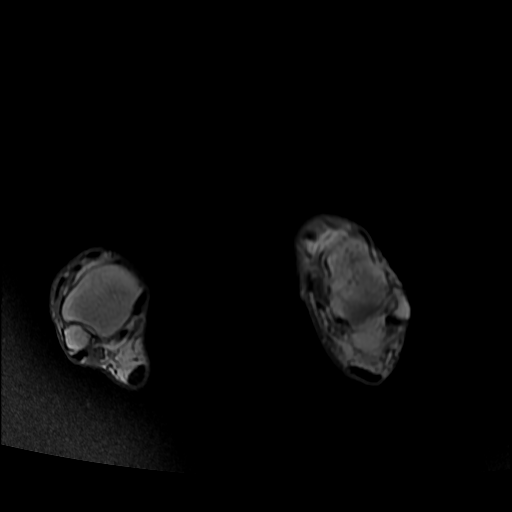
[im 11/59]
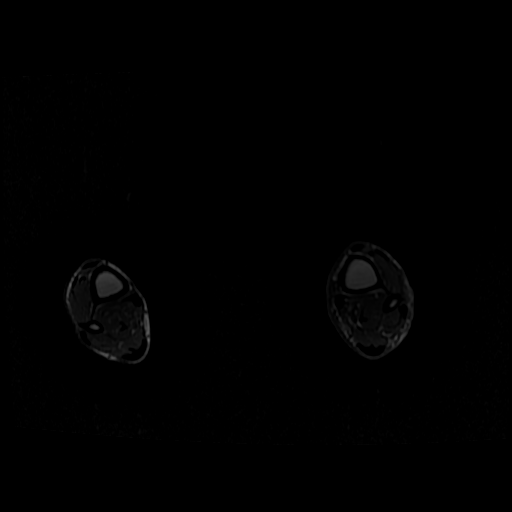
[im 32/59]
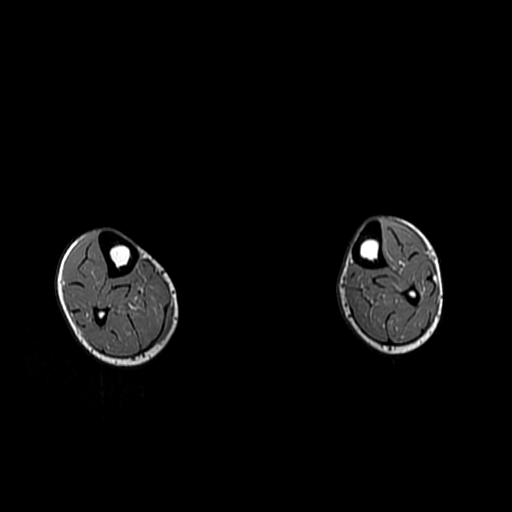
[im 53/59]
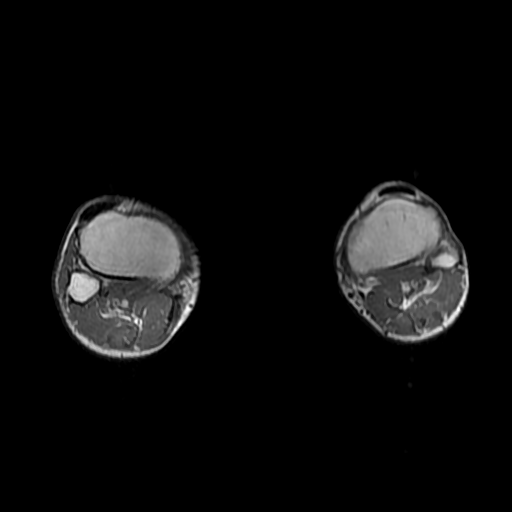

[Series 4: T2 fat-sat · axial · 5.0mm · 0.57mm/px · z∈[-139,+238]mm · 9 of 59 slices shown]
[im 1/59]
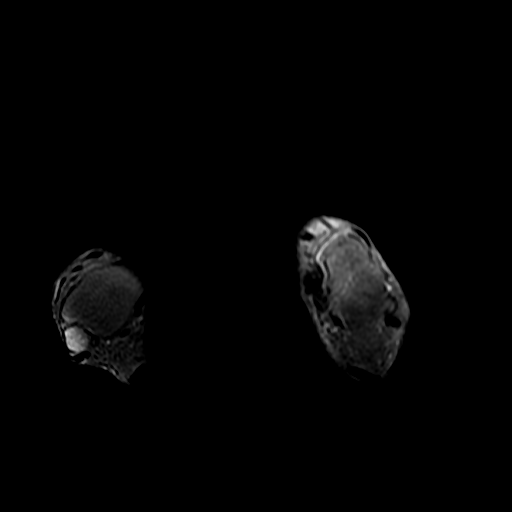
[im 11/59]
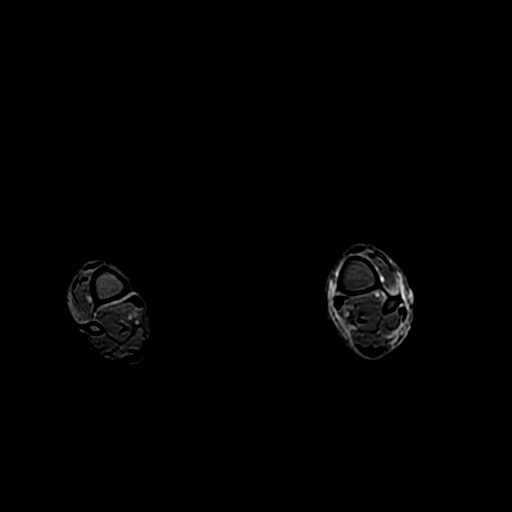
[im 16/59]
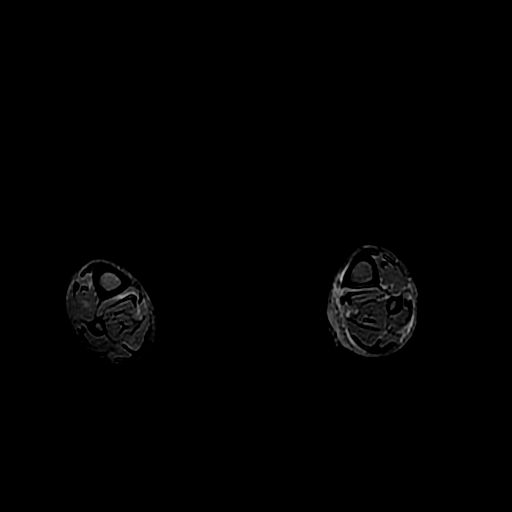
[im 27/59]
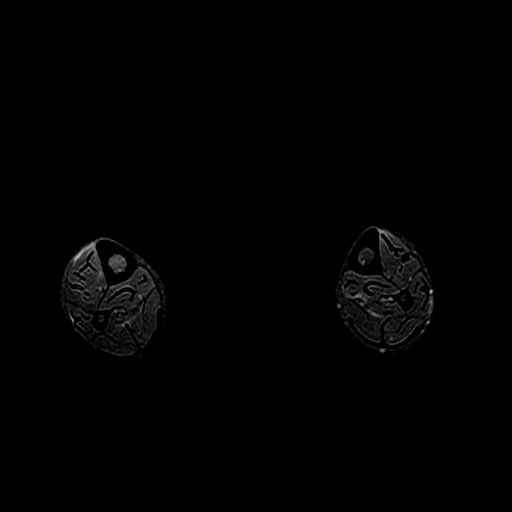
[im 32/59]
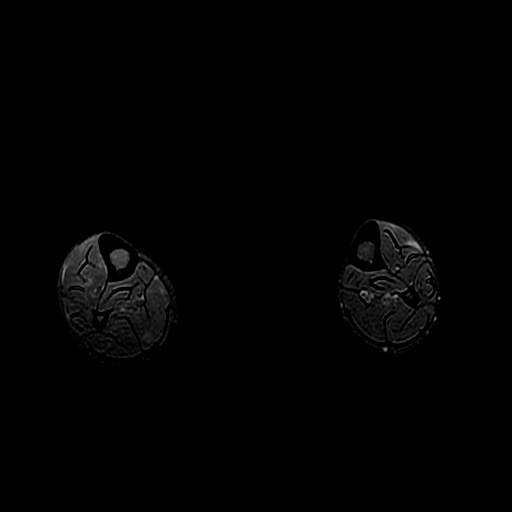
[im 43/59]
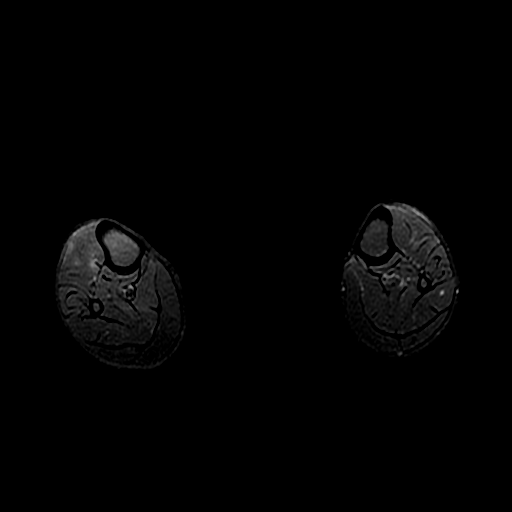
[im 48/59]
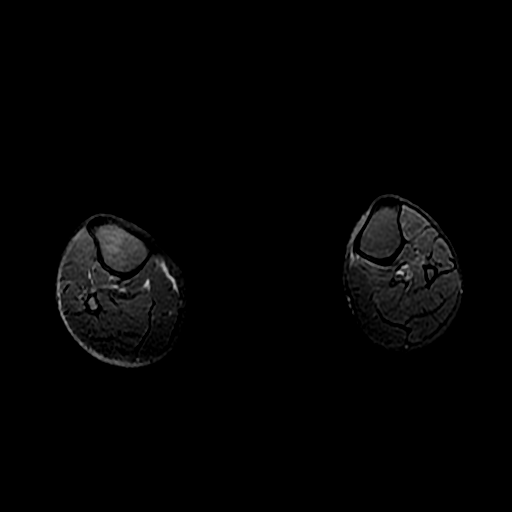
[im 53/59]
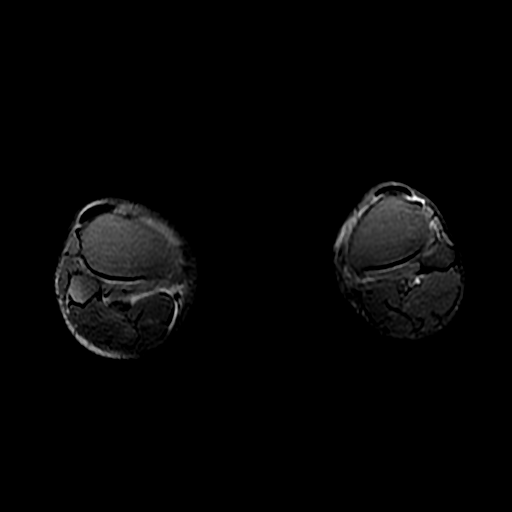
[im 59/59]
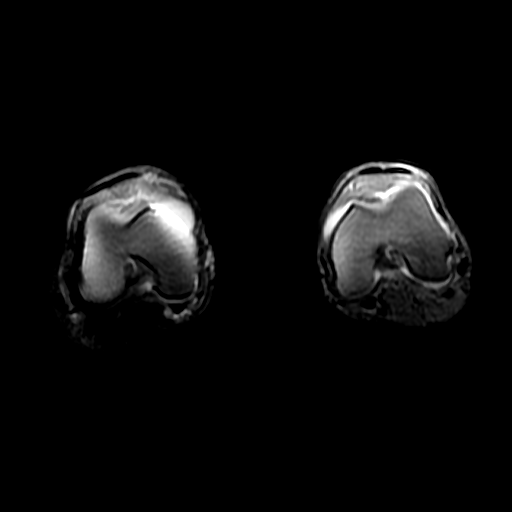

[Series 6: T1 · coronal · 5.0mm · 0.76mm/px · 3 of 20 slices shown (2 of 3)]
[im 1/20]
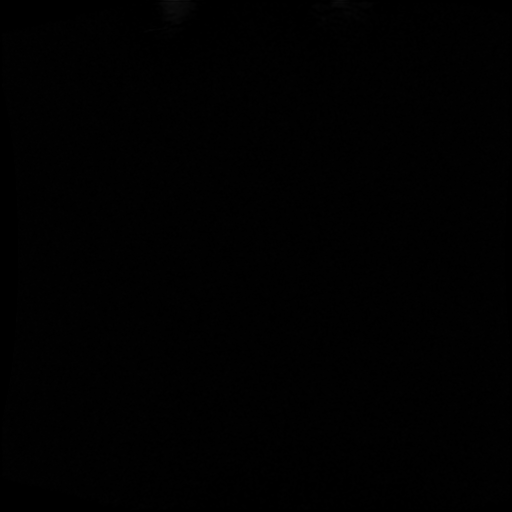
[im 13/20]
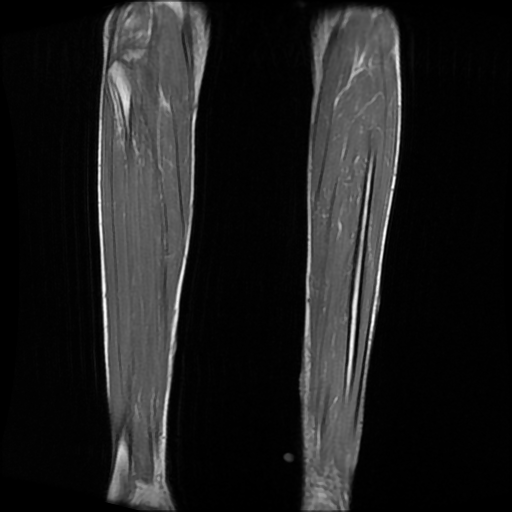
[im 20/20]
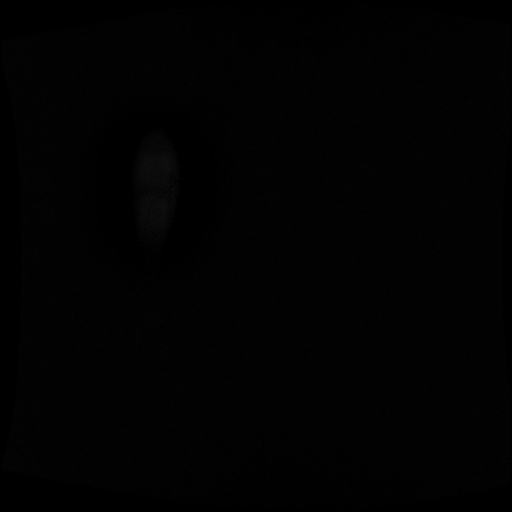

[Series 7: T1 · sagittal · 5.0mm · 0.78mm/px · 3 of 18 slices shown (3 of 3)]
[im 1/18]
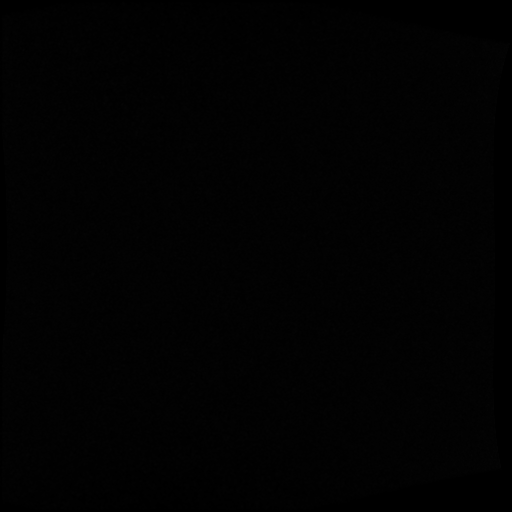
[im 12/18]
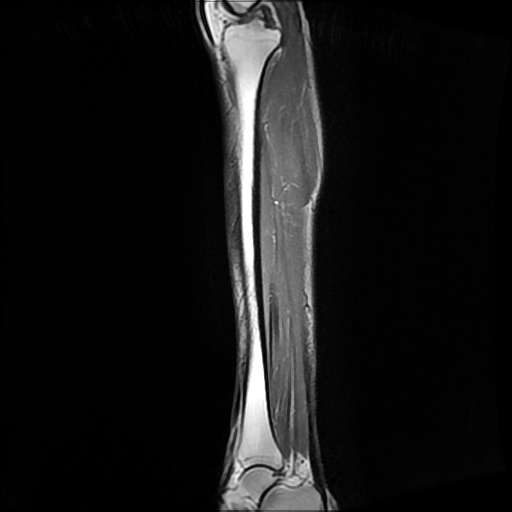
[im 18/18]
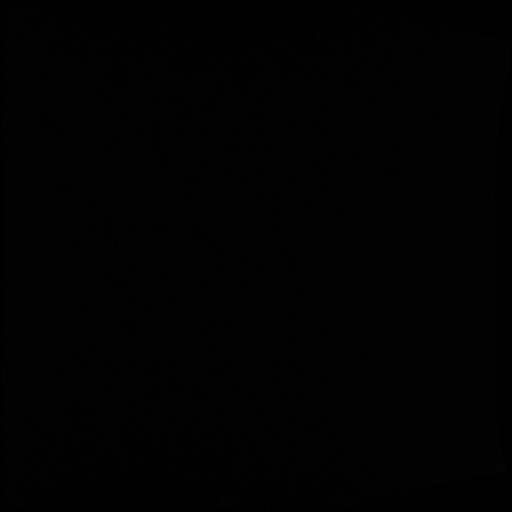

[19 of 40 positions shown; findings below may reference images not displayed]

FINDINGS: Examination was performed using the body coil. Both lower legs are
included on the axial and coronal images.

Capsules were placed around the nonhealing wound posteromedially in
the distal left lower leg. There is overlying skin irregularity with
dermal thickening and subcutaneous edema. The subcutaneous edema
extends laterally. No focal fluid collection demonstrated on
noncontrast imaging. No evidence of foreign body, tenosynovitis or
muscular T2 hyperintensity.

The left tibia and fibula appear normal.

No significant abnormalities identified within the right lower leg.
IMPRESSION: 1. Nonhealing wound in the posteromedial aspect of the distal left
lower leg is associated with surrounding subcutaneous edema most
consistent with cellulitis.
2. No drainable abscess, deep soft tissue infection or evidence of
osteomyelitis.

## 2017-05-24 ENCOUNTER — Encounter (INDEPENDENT_AMBULATORY_CARE_PROVIDER_SITE_OTHER): Payer: Self-pay | Admitting: Family

## 2017-05-24 ENCOUNTER — Ambulatory Visit (INDEPENDENT_AMBULATORY_CARE_PROVIDER_SITE_OTHER): Payer: Medicaid Other | Admitting: Family

## 2017-05-24 ENCOUNTER — Encounter (INDEPENDENT_AMBULATORY_CARE_PROVIDER_SITE_OTHER): Payer: Self-pay

## 2017-05-24 DIAGNOSIS — I70262 Atherosclerosis of native arteries of extremities with gangrene, left leg: Secondary | ICD-10-CM

## 2017-05-24 DIAGNOSIS — Z89512 Acquired absence of left leg below knee: Secondary | ICD-10-CM

## 2017-05-24 DIAGNOSIS — Z89511 Acquired absence of right leg below knee: Secondary | ICD-10-CM

## 2017-05-24 MED ORDER — DOXYCYCLINE HYCLATE 100 MG PO TABS: 100.0000 mg | ORAL_TABLET | Freq: Two times a day (BID) | ORAL | 0 refills | Status: DC

## 2017-05-24 MED ORDER — OXYCODONE HCL 5 MG PO TABS: 5.0000 mg | ORAL_TABLET | Freq: Two times a day (BID) | ORAL | 0 refills | Status: DC | PRN

## 2017-05-24 NOTE — Progress Notes (Signed)
Post-Op Visit Note   Patient: [name]           Date of Birth: 12-Mar-1961           MRN: [id number] Visit Date: 05/24/2017 PCP: Scot Jun, FNP  Chief Complaint:  Chief Complaint  Patient presents with  . Left Leg - Pain  . Right Leg - Pain    HPI:  The patient is a 56 year old woman who is seen today for evaluation of her left below the knee amputation surgery was done on July 7 of this year. She is requesting a refill of her oxycodone. She states that she's been having pus draining from a lateral sore to her below the knee amputation. She is no dressing applied today.  Is in a motorized scooter today for ambulation she is currently completing a course of doxycycline it should've completed today.    Ortho Exam On examination the right below the knee amputation is well-healed. The left below the knee amputation has one small opening medially and 1 laterally there is exposed internal suture material this is harvested today. Purulence expressed. No surrounding erythema. No odor.  Visit Diagnoses:  1. S/P bilateral below knee amputation ([place])   2. Atherosclerosis of native arteries of extremities with gangrene, left leg ([facility])     Plan: will extend doxycycline to a 4 week course. follow up in 2 weeks. Do daily dial soap cleansing and dry dressing changes.  Follow-Up Instructions: Return in about 2 weeks (around 06/07/2017).   Imaging: No results found.  Orders:  No orders of the defined types were placed in this encounter.  Meds ordered this encounter  Medications  . doxycycline (VIBRA-TABS) 100 MG tablet    Sig: Take 1 tablet (100 mg total) by mouth every 12 (twelve) hours.    Dispense:  28 tablet    Refill:  0  . oxyCODONE (OXY IR/ROXICODONE) 5 MG immediate release tablet    Sig: Take 1 tablet (5 mg total) by mouth every 12 (twelve) hours as needed for breakthrough pain.    Dispense:  14 tablet    Refill:  0     PMFS History: Patient Active Problem List    Diagnosis Date Noted  . Hypertensive crisis   . Phantom limb pain ([place])   . S/P bilateral below knee amputation ([place]) 04/13/2017  . Tobacco abuse   . Post-operative pain   . Acute blood loss anemia   . Atherosclerosis of native arteries of extremities with gangrene, left leg ([place])   . Wound infection 03/30/2017  . AKI (acute kidney injury) ([place]) 02/07/2017  . Acute kidney injury ([place]) 02/06/2017  . Wound healing, delayed   . Osteomyelitis ([place])   . Ulcer with gangrene, with necrosis of muscle ([place])   . Skin ulcer of lower leg with necrosis of muscle, unspecified laterality ([place]) 06/04/2015  . Protein-calorie malnutrition, severe ([place]) 01/15/2015  . MDD (major depressive disorder), recurrent episode, severe ([place]) 10/16/2014  . Cocaine use disorder, severe, dependence ([place]) 10/10/2014  . 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 Essential hypertension 02/26/2010   Past Medical History:  Diagnosis Date  . CAP (community acquired pneumonia) 03/2014 X 2  . Cocaine abuse    ongoing with resultant vaculitis.  . Depression   . Headache    "weekly" (07/29/2016)  . Hypertension   . Inflammatory arthritis   . Migraines    "probably 5-6/yr" (07/29/2016)  . Normocytic anemia    BL Hgb 9.8-12. Last  anemia panel 04/2010 - showing Fe 19, ferritin 101.  Pt on monthly B12 injections  . Rheumatoid arthritis(714.0)    patient reported  . 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 Followed by Dr. Louanne Skye    Family History  Problem Relation Age of Onset  . Breast cancer Mother        Breast cancer  . Alcohol abuse Mother   . Colon cancer Maternal Aunt 61  . Alcohol abuse Father     Past Surgical History:  Procedure Laterality Date  . AMPUTATION Left 05/22/2016   Procedure: AMPUTATION LEFT LONG FINGER;  Surgeon: Marybelle Killings, MD;  Location: [facility];  Service: Orthopedics;  Laterality: Left;  . AMPUTATION Bilateral 04/10/2017    Procedure: AMPUTATION BELOW KNEE;  Surgeon: Newt Minion, MD;  Location: [facility];  Service: Orthopedics;  Laterality: Bilateral;  . HERNIA REPAIR     "stomach"  . I&D EXTREMITY Right 09/26/2015   Procedure: IRRIGATION AND DEBRIDEMENT LEG WOUND  VAC PLACEMENT.;  Surgeon: Loel Lofty Dillingham, DO;  Location: [facility];  Service: Plastics;  Laterality: Right;  . INCISION AND DRAINAGE OF WOUND Bilateral 10/20/2016   Procedure: IRRIGATION AND DEBRIDEMENT WOUND BILATERAL;  Surgeon: Edrick Kins, DPM;  Location: [facility];  Service: Podiatry;  Laterality: Bilateral;  . IRRIGATION AND DEBRIDEMENT ABSCESS Bilateral 09/26/2013   Procedure: DEBRIDEMENT ULCERS BILATERAL THIGHS;  Surgeon: Gwenyth Ober, MD;  Location: [facility];  Service: General;  Laterality: Bilateral;  . SKIN BIOPSY Bilateral    shin nodules   Social History   Occupational History  . Disability     since 2011, due to her rheumatoid arthritis   Social History Main Topics  . Smoking status: Current Every Day Smoker    Packs/day: 0.12    Years: 38.00    Types: Cigarettes  . Smokeless tobacco: Never Used     Comment: 2 A DAY  . Alcohol use No  . Drug use: Yes    Types: "Crack" cocaine, Cocaine     Comment: 07/29/2016 "only use the one you smoke now., last used ~ 1-2 wk ago"  . Sexual activity: Not Currently

## 2017-05-28 IMAGING — CR DG HAND 2V*R*
2 series · 2 of 2 positions shown · non-contrast
Comparison: Radiograph dated 01/14/2015

CLINICAL DATA: 54-year-old female with abscess at distal tip of the
right index finger

EXAM:
RIGHT HAND - 2 VIEW

[hand pa]
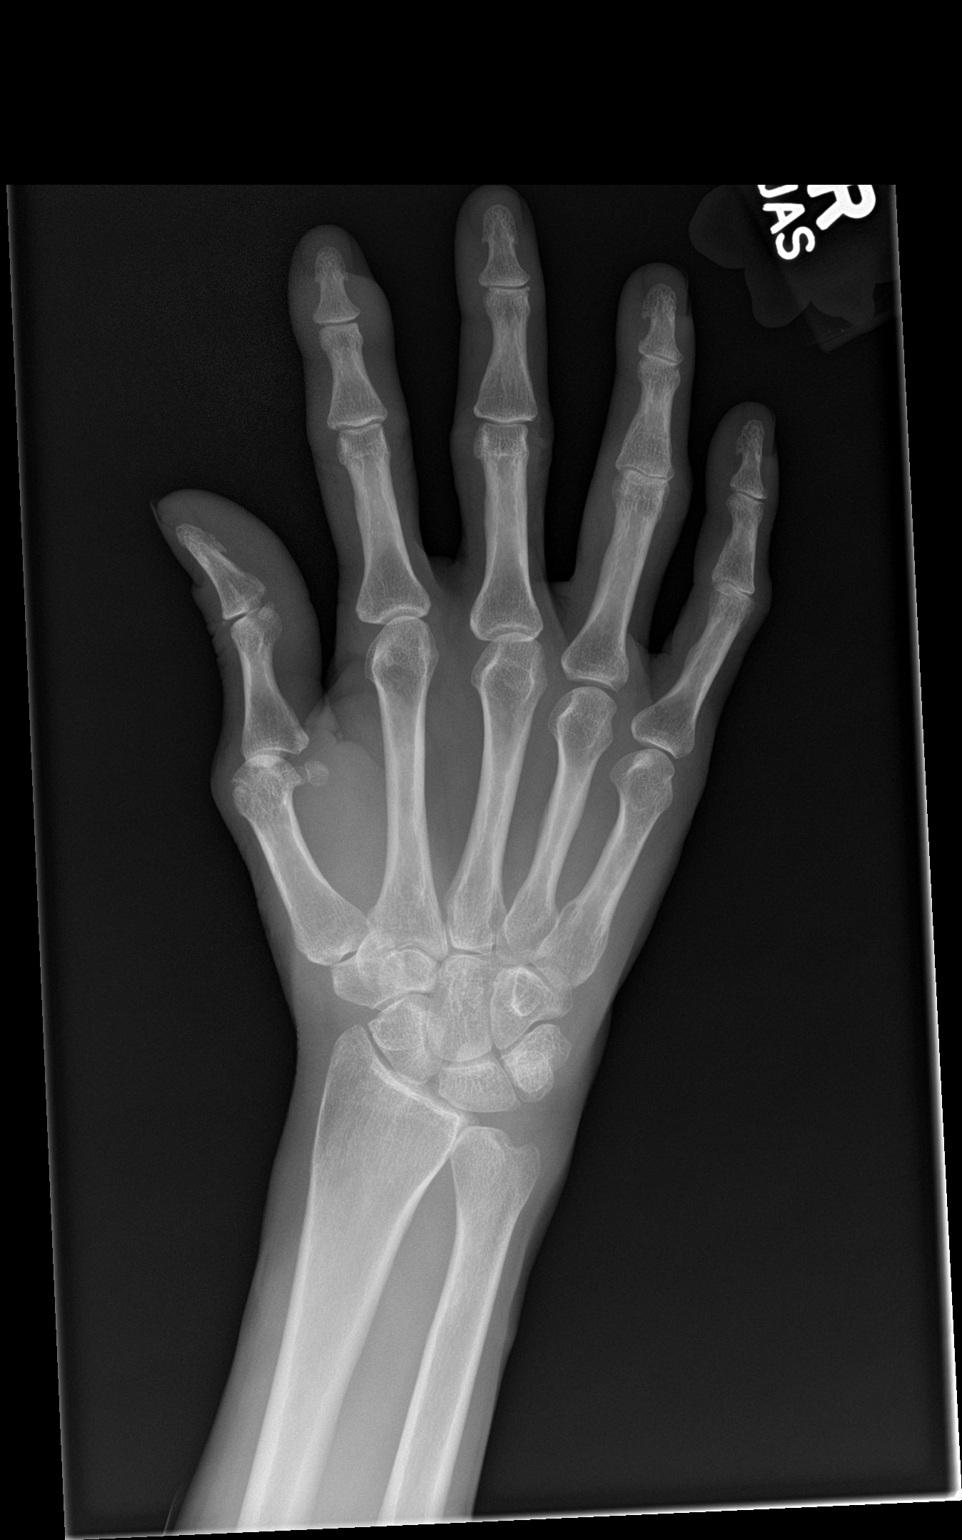

[hand lat]
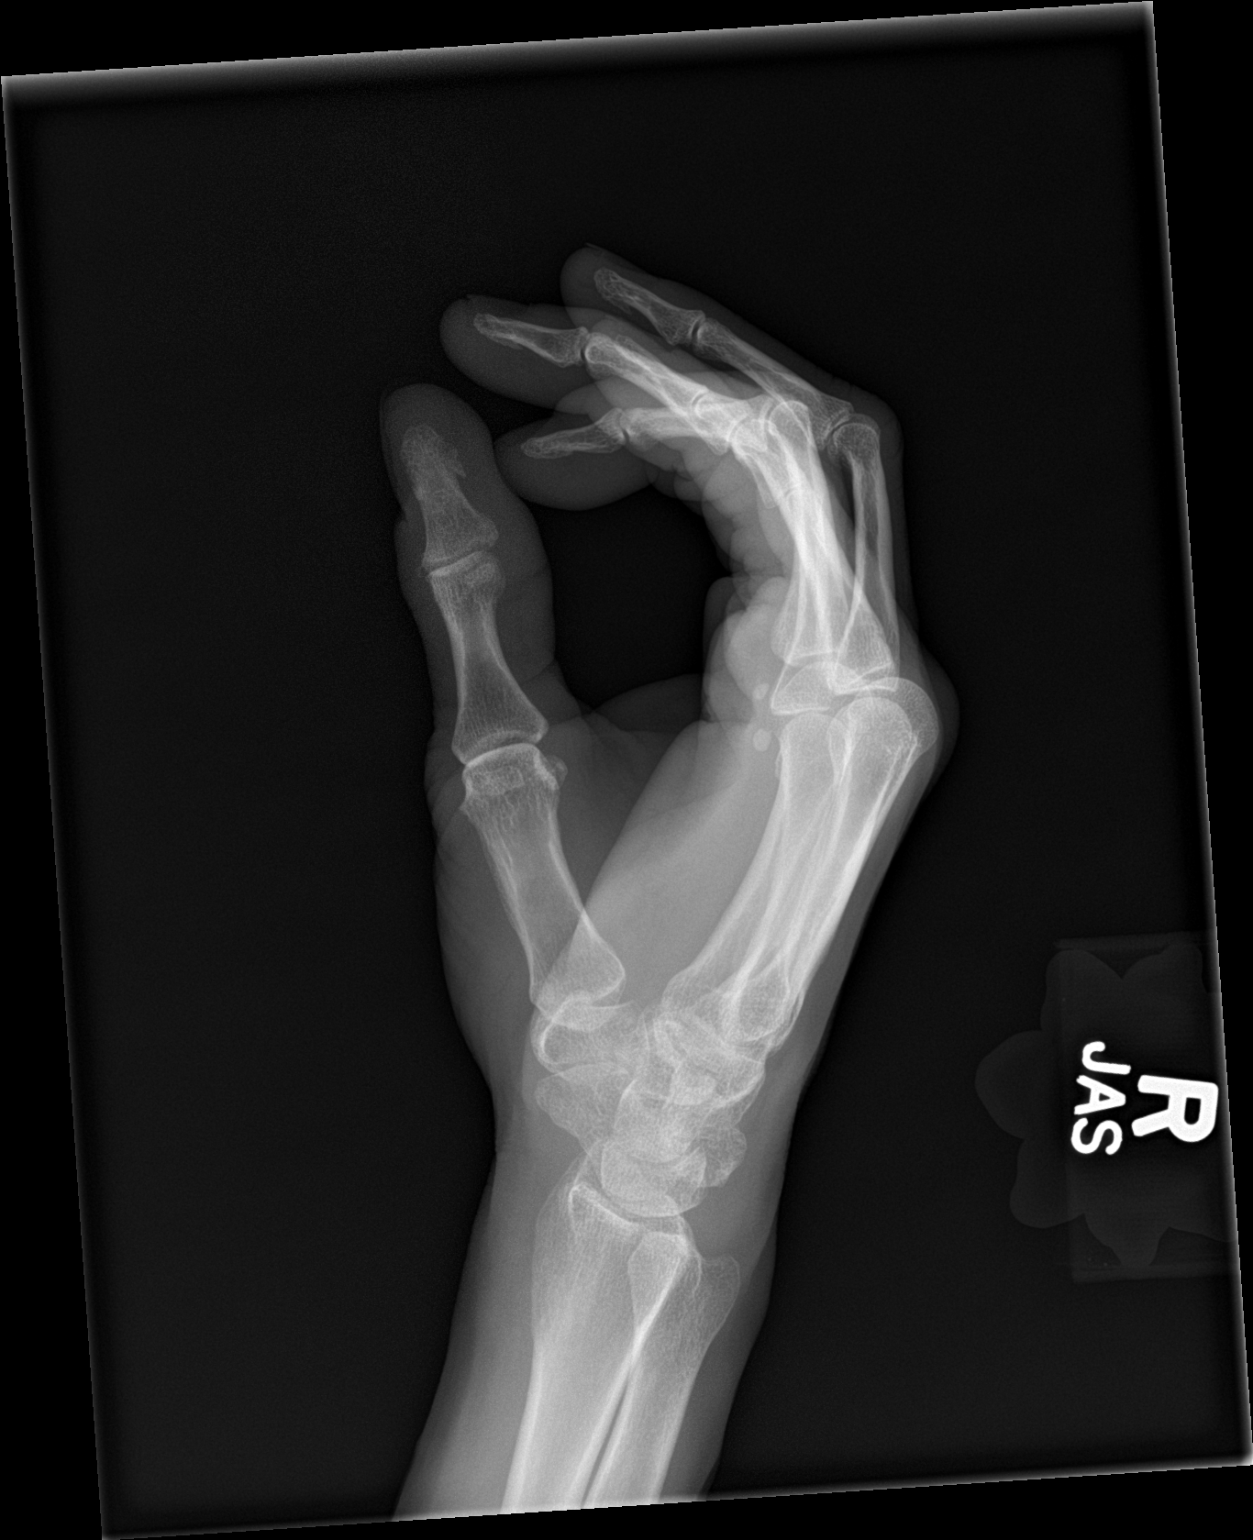

[2 of 2 positions shown; findings below may reference images not displayed]

FINDINGS: There is no acute fracture or dislocation. There is a focal soft
tissue swelling at the distal interphalangeal joint of the second
digit. No soft tissue gas or radiopaque foreign object identified.
Clinical correlation is recommended.
IMPRESSION: Soft tissue swelling of the distal aspect of the index finger. No
radiopaque foreign object or soft tissue gas identified.

No fracture or dislocation.

## 2017-05-28 IMAGING — DX DG TIBIA/FIBULA 2V*L*
2 series · 2 of 2 positions shown · non-contrast
Comparison: June 04, 2015.

CLINICAL DATA: Left lower extremity pain and infection.

EXAM:
LEFT TIBIA AND FIBULA - 2 VIEW

[tibia ap]
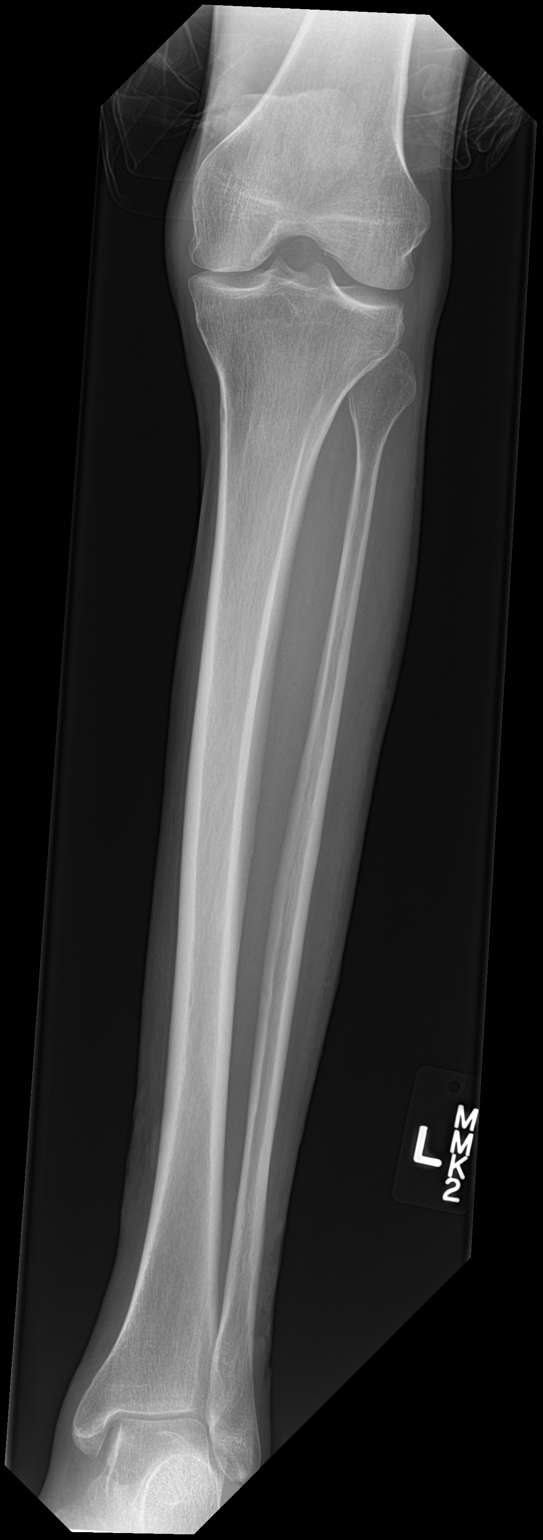

[tibia lat]
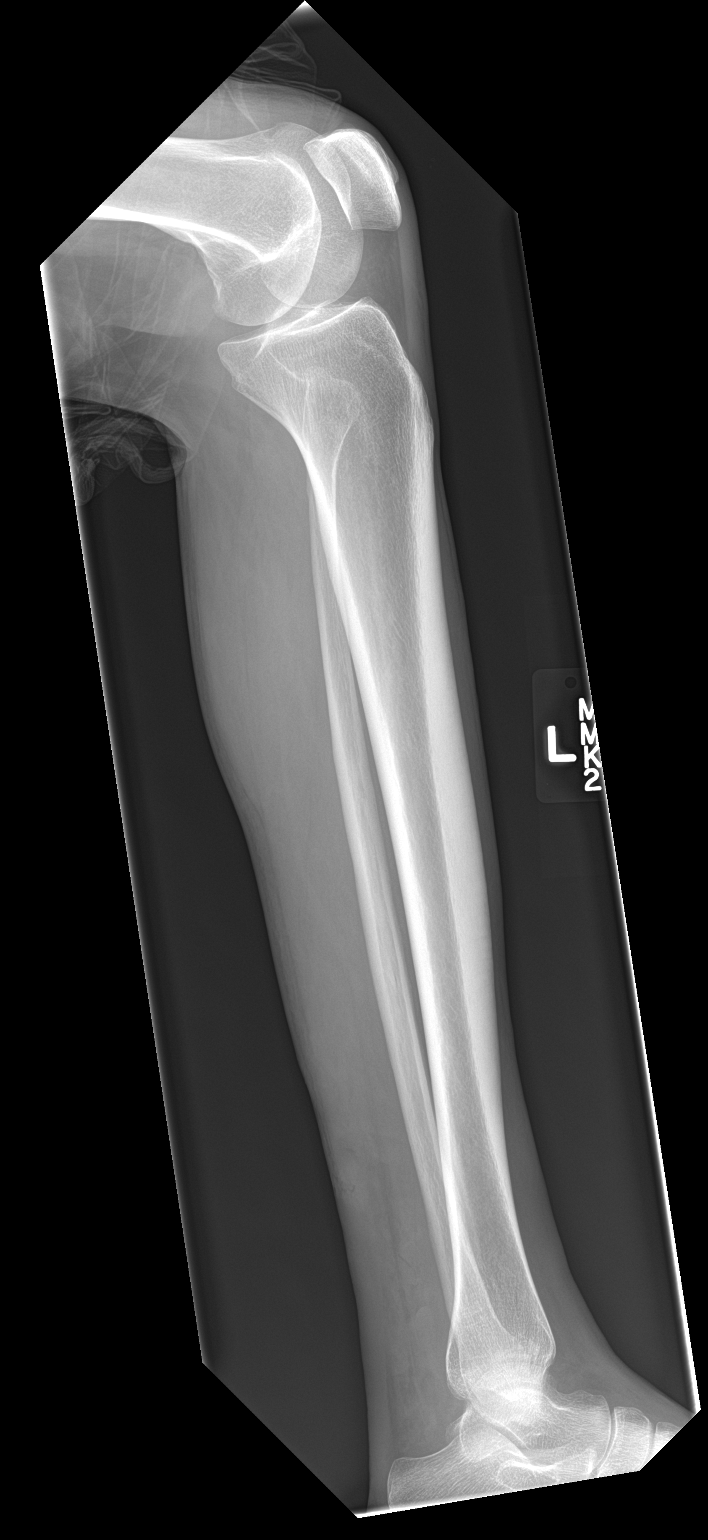

[2 of 2 positions shown; findings below may reference images not displayed]

FINDINGS: There is no evidence of fracture or other focal bone lesions. Soft
tissues are unremarkable.
IMPRESSION: Normal left tibia and fibula.

## 2017-05-30 ENCOUNTER — Emergency Department (HOSPITAL_COMMUNITY)
Admission: EM | Admit: 2017-05-30 | Discharge: 2017-05-30 | Disposition: A | Discharge status: Home / Self Care | Payer: Medicaid Other | Attending: Emergency Medicine | Admitting: Emergency Medicine

## 2017-05-30 ENCOUNTER — Encounter (HOSPITAL_COMMUNITY): Payer: Self-pay

## 2017-05-30 DIAGNOSIS — Z79899 Other long term (current) drug therapy: Secondary | ICD-10-CM | POA: Insufficient documentation

## 2017-05-30 DIAGNOSIS — F17210 Nicotine dependence, cigarettes, uncomplicated: Secondary | ICD-10-CM | POA: Insufficient documentation

## 2017-05-30 DIAGNOSIS — G894 Chronic pain syndrome: Secondary | ICD-10-CM | POA: Diagnosis not present

## 2017-05-30 DIAGNOSIS — G8918 Other acute postprocedural pain: Secondary | ICD-10-CM | POA: Insufficient documentation

## 2017-05-30 DIAGNOSIS — M79609 Pain in unspecified limb: Secondary | ICD-10-CM

## 2017-05-30 DIAGNOSIS — I10 Essential (primary) hypertension: Secondary | ICD-10-CM | POA: Insufficient documentation

## 2017-05-30 MED ORDER — OXYCODONE-ACETAMINOPHEN 5-325 MG PO TABS
2.0000 | ORAL_TABLET | Freq: Once | ORAL | Status: AC
  Administered 2017-05-30: 2 via ORAL
  Filled 2017-05-30: qty 2

## 2017-05-30 NOTE — ED Triage Notes (Signed)
PT reports she came to ED for pain control. Pt was given  Pain meds  And has used all the pills.

## 2017-05-30 NOTE — ED Provider Notes (Signed)
[facility] DEPT Provider Note   CSN: [id number] Arrival date & time: 05/30/17  1509     History   Chief Complaint Chief Complaint  Patient presents with  . Leg Pain    HPI [name] is a 56 y.o. female.  HPI Patient is status post bilateral below-the-knee amputations from 7\04/23/2017 by Dr. Sharol Given. Patient previously had chronic nonhealing wounds and ulcers. Patient has been seen in follow-up and is getting wound care. She reports chronic and severe pain. Pain is sharp and burning quality. Patient is taking doxycycline which she should still be taking for several weeks. She reports she is out of her oxycodone. Past Medical History:  Diagnosis Date  . CAP (community acquired pneumonia) 03/2014 X 2  . Cocaine abuse    ongoing with resultant vaculitis.  . Depression   . Headache    "weekly" (07/29/2016)  . Hypertension   . Inflammatory arthritis   . Migraines    "probably 5-6/yr" (07/29/2016)  . Normocytic anemia    BL Hgb 9.8-12. Last anemia panel 04/2010 - showing Fe 19, ferritin 101.  Pt on monthly B12 injections  . Rheumatoid arthritis(714.0)    patient reported  . 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 Followed by Dr. Louanne Skye    Patient Active Problem List   Diagnosis Date Noted  . Hypertensive crisis   . Phantom limb pain ([place])   . S/P bilateral below knee amputation ([place]) 04/13/2017  . Tobacco abuse   . Post-operative pain   . Acute blood loss anemia   . Atherosclerosis of native arteries of extremities with gangrene, left leg ([place])   . Wound infection 03/30/2017  . AKI (acute kidney injury) ([name]) 02/07/2017  . Acute kidney injury ([place]) 02/06/2017  . Wound healing, delayed   . Osteomyelitis ([place])   . Ulcer with gangrene, with necrosis of muscle ([place])   . Skin ulcer of lower leg with necrosis of muscle, unspecified laterality ([place]) 06/04/2015  . Protein-calorie malnutrition, severe ([place]) 01/15/2015    . MDD (major depressive disorder), recurrent episode, severe ([place]) 10/16/2014  . Cocaine use disorder, severe, dependence ([place]) 10/10/2014  . 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 Essential hypertension 02/26/2010    Past Surgical History:  Procedure Laterality Date  . AMPUTATION Left 05/22/2016   Procedure: AMPUTATION LEFT LONG FINGER;  Surgeon: Marybelle Killings, MD;  Location: [facility];  Service: Orthopedics;  Laterality: Left;  . AMPUTATION Bilateral 04/10/2017   Procedure: AMPUTATION BELOW KNEE;  Surgeon: Newt Minion, MD;  Location: [facility];  Service: Orthopedics;  Laterality: Bilateral;  . HERNIA REPAIR     "stomach"  . I&D EXTREMITY Right 09/26/2015   Procedure: IRRIGATION AND DEBRIDEMENT LEG WOUND  VAC PLACEMENT.;  Surgeon: Loel Lofty Dillingham, DO;  Location: [facility];  Service: Plastics;  Laterality: Right;  . INCISION AND DRAINAGE OF WOUND Bilateral 10/20/2016   Procedure: IRRIGATION AND DEBRIDEMENT WOUND BILATERAL;  Surgeon: Edrick Kins, DPM;  Location: [facility];  Service: Podiatry;  Laterality: Bilateral;  . IRRIGATION AND DEBRIDEMENT ABSCESS Bilateral 09/26/2013   Procedure: DEBRIDEMENT ULCERS BILATERAL THIGHS;  Surgeon: Gwenyth Ober, MD;  Location: [facility];  Service: General;  Laterality: Bilateral;  . SKIN BIOPSY Bilateral    shin nodules    OB History    No data available       Home Medications    Prior to Admission medications   Medication Sig Start  Date End Date Taking? Authorizing Provider  amLODipine (NORVASC) 10 MG tablet Take 1 tablet (10 mg total) by mouth daily. 05/02/17   Scot Jun, FNP  amoxicillin-clavulanate (AUGMENTIN) 500-125 MG tablet Take 1 tablet by mouth 3 (three) times daily. 04/02/17   [provider]  buPROPion (WELLBUTRIN XL) 150 MG 24 hr tablet Take 1 tablet (150 mg total) by mouth daily. 05/02/17   Scot Jun, FNP  docusate sodium (COLACE) 100 MG capsule Take 1 capsule (100 mg total) by mouth 2 (two) times daily.  05/02/17   Scot Jun, FNP  doxycycline (VIBRA-TABS) 100 MG tablet Take 1 tablet (100 mg total) by mouth every 12 (twelve) hours. 05/24/17   Suzan Slick, NP  ferrous sulfate 325 (65 FE) MG tablet Take 1 tablet (325 mg total) by mouth 2 (two) times daily with a meal. 05/02/17   Scot Jun, FNP  gabapentin (NEURONTIN) 100 MG capsule Take 1 capsule (100 mg total) by mouth 3 (three) times daily. 04/27/17   Scot Jun, FNP  methocarbamol (ROBAXIN) 500 MG tablet Take 1 tablet (500 mg total) by mouth every 6 (six) hours as needed for muscle spasms. 05/02/17   Scot Jun, FNP  mirtazapine (REMERON) 7.5 MG tablet Take 1 tablet (7.5 mg total) by mouth at bedtime. 05/02/17   Scot Jun, FNP  Multiple Vitamin (MULTIVITAMIN WITH MINERALS) TABS tablet Take 1 tablet by mouth daily. 05/02/17   Scot Jun, FNP  nicotine (NICODERM CQ - DOSED IN MG/24 HR) 7 mg/24hr patch Place 1 patch (7 mg total) onto the skin daily. 05/02/17   Scot Jun, FNP  oxyCODONE (OXY IR/ROXICODONE) 5 MG immediate release tablet Take 1 tablet (5 mg total) by mouth every 12 (twelve) hours as needed for breakthrough pain. 05/24/17   Suzan Slick, NP  polyethylene glycol (MIRALAX / GLYCOLAX) packet Take 17 g by mouth daily as needed for mild constipation. 05/02/17   Scot Jun, FNP  silver sulfADIAZINE (SILVADENE) 1 % cream Apply topically daily. 04/17/17   Bary Leriche, PA-C    Family History Family History  Problem Relation Age of Onset  . Breast cancer Mother        Breast cancer  . Alcohol abuse Mother   . Colon cancer Maternal Aunt 33  . Alcohol abuse Father     Social History Social History  Substance Use Topics  . Smoking status: Current Every Day Smoker    Packs/day: 0.12    Years: 38.00    Types: Cigarettes  . Smokeless tobacco: Never Used     Comment: 2 A DAY  . Alcohol use No     Allergies   Acetaminophen and Other   Review of Systems Review of  Systems 10 Systems reviewed and are negative for acute change except as noted in the HPI.   Physical Exam Updated Vital Signs BP (!) 160/70 (BP Location: Right Arm)   Pulse (!) 115   Temp 98.4 F (36.9 C) (Oral)   SpO2 100%   Physical Exam  Constitutional: She is oriented to person, place, and time.  Patient is very thin borderline cachectic. She is nontoxic. Intermittently tearful.  HENT:  Head: Normocephalic and atraumatic.  Eyes: EOM are normal.  Cardiovascular: Normal rate, regular rhythm and normal heart sounds.   Pulmonary/Chest: Effort normal and breath sounds normal.  Abdominal: Soft. She exhibits no distension. There is no tenderness.  Musculoskeletal:  Bilateral below-the-knee amputations see attached images.  Neurological: She is alert and oriented to person, place, and time. No cranial nerve deficit. She exhibits normal muscle tone. Coordination normal.  Skin: Skin is warm and dry.   Slight dehiscence but no purulent drainage or surrounding cellulitis  Superficial eschar. No signs of secondary infection.      ED Treatments / Results  Labs (all labs ordered are listed, but only abnormal results are displayed) Labs Reviewed - No data to display  EKG  EKG Interpretation None       Radiology No results found.  Procedures Procedures (including critical care time)  Medications Ordered in ED Medications  oxyCODONE-acetaminophen (PERCOCET/ROXICET) 5-325 MG per tablet 2 tablet (not administered)     Initial Impression / Assessment and Plan / ED Course  I have reviewed the triage vital signs and the nursing notes.  Pertinent labs & imaging results that were available during my care of the patient were reviewed by me and considered in my medical decision making (see chart for details).     Final Clinical Impressions(s) / ED Diagnoses   Final diagnoses:  Postoperative pain of extremity  Chronic pain syndrome  Patient reports significant pain and  is out of oxycodone. Wounds do peeled to be healing fairly well. She has small area of dehiscence but does not appear secondarily infected. Patient is being managed by orthopedics and has been prescribed long course of doxycycline. Patient reports she is being compliant. At this time, plan will be to give patient 1 dose of oral Percocet in the emergency department for acute pain control and redress the wounds. Patient counseled to call Dr. Jess Barters office on Monday regarding ongoing pain and wound care.  New Prescriptions New Prescriptions   No medications on file     Charlesetta Shanks, MD 05/30/17 [phone]

## 2017-05-30 NOTE — ED Triage Notes (Signed)
Bilateral BKA.  Right stump has wound on end that is partially scabbed, wound on left stump has blood draining from several openings.  Pt on Doxycycline is to do dry dressing changes per last MD appt 05-24-17,  Stumps have no dressing applied today.  Pt tearful c/o pain.  Pt is in manual wheelchair.  Pt is out of oxycodone.

## 2017-06-05 ENCOUNTER — Emergency Department (HOSPITAL_COMMUNITY): Payer: Medicaid Other

## 2017-06-05 ENCOUNTER — Encounter (HOSPITAL_COMMUNITY): Payer: Self-pay | Admitting: Emergency Medicine

## 2017-06-05 ENCOUNTER — Emergency Department (HOSPITAL_BASED_OUTPATIENT_CLINIC_OR_DEPARTMENT_OTHER): Payer: Medicaid Other

## 2017-06-05 ENCOUNTER — Observation Stay (HOSPITAL_COMMUNITY)
Admission: EM | Admit: 2017-06-05 | Discharge: 2017-06-06 | Disposition: A | Discharge status: Home / Self Care | Payer: Medicaid Other | Attending: Family Medicine | Admitting: Family Medicine

## 2017-06-05 DIAGNOSIS — I3139 Other pericardial effusion (noninflammatory): Secondary | ICD-10-CM

## 2017-06-05 DIAGNOSIS — I5031 Acute diastolic (congestive) heart failure: Secondary | ICD-10-CM

## 2017-06-05 DIAGNOSIS — R9431 Abnormal electrocardiogram [ECG] [EKG]: Secondary | ICD-10-CM

## 2017-06-05 DIAGNOSIS — R079 Chest pain, unspecified: Secondary | ICD-10-CM | POA: Diagnosis not present

## 2017-06-05 DIAGNOSIS — Z79899 Other long term (current) drug therapy: Secondary | ICD-10-CM | POA: Insufficient documentation

## 2017-06-05 DIAGNOSIS — I313 Pericardial effusion (noninflammatory): Secondary | ICD-10-CM | POA: Insufficient documentation

## 2017-06-05 DIAGNOSIS — I10 Essential (primary) hypertension: Secondary | ICD-10-CM | POA: Diagnosis not present

## 2017-06-05 DIAGNOSIS — I4589 Other specified conduction disorders: Principal | ICD-10-CM | POA: Insufficient documentation

## 2017-06-05 DIAGNOSIS — F17210 Nicotine dependence, cigarettes, uncomplicated: Secondary | ICD-10-CM | POA: Insufficient documentation

## 2017-06-05 DIAGNOSIS — L97903 Non-pressure chronic ulcer of unspecified part of unspecified lower leg with necrosis of muscle: Secondary | ICD-10-CM

## 2017-06-05 LAB — CBC
HEMATOCRIT: 31.3 % — AB (ref 36.0–46.0)
HEMOGLOBIN: 10.6 g/dL — AB (ref 12.0–15.0)
MCH: 27 pg (ref 26.0–34.0)
MCHC: 33.9 g/dL (ref 30.0–36.0)
MCV: 79.6 fL (ref 78.0–100.0)
PLATELETS: 369 10*3/uL (ref 150–400)
RBC: 3.93 MIL/uL (ref 3.87–5.11)
RDW: 14.7 % (ref 11.5–15.5)
WBC: 6.7 10*3/uL (ref 4.0–10.5)

## 2017-06-05 LAB — RAPID URINE DRUG SCREEN, HOSP PERFORMED
AMPHETAMINES: NOT DETECTED
Barbiturates: NOT DETECTED
Benzodiazepines: NOT DETECTED
COCAINE: POSITIVE — AB
OPIATES: NOT DETECTED
Tetrahydrocannabinol: POSITIVE — AB

## 2017-06-05 LAB — TROPONIN I
Troponin I: <0.03 ng/mL (ref <0.03)
Troponin I: <0.03 ng/mL (ref <0.03)

## 2017-06-05 LAB — COMPREHENSIVE METABOLIC PANEL
ALBUMIN: 3.2 g/dL — AB (ref 3.5–5.0)
ALK PHOS: 74 U/L (ref 38–126)
ALT: 11 U/L — AB (ref 14–54)
ANION GAP: 14 (ref 5–15)
AST: 19 U/L (ref 15–41)
BILIRUBIN TOTAL: 0.3 mg/dL (ref 0.3–1.2)
CALCIUM: 9.4 mg/dL (ref 8.9–10.3)
CO2: 21 mmol/L — AB (ref 22–32)
Chloride: 102 mmol/L (ref 101–111)
Creatinine, Ser: 0.58 mg/dL (ref 0.44–1.00)
GFR calc Af Amer: >60 mL/min (ref >60)
GFR calc non Af Amer: >60 mL/min (ref >60)
GLUCOSE: 98 mg/dL (ref 65–99)
Potassium: 3.2 mmol/L — ABNORMAL LOW (ref 3.5–5.1)
SODIUM: 137 mmol/L (ref 135–145)
Total Protein: 6.8 g/dL (ref 6.5–8.1)

## 2017-06-05 LAB — PROTIME-INR
INR: 1.08
Prothrombin Time: 13.9 seconds (ref 11.4–15.2)

## 2017-06-05 LAB — CBC WITH DIFFERENTIAL/PLATELET
BASOS PCT: 0 %
Basophils Absolute: 0 10*3/uL (ref 0.0–0.1)
EOS PCT: 1 %
Eosinophils Absolute: 0.1 10*3/uL (ref 0.0–0.7)
HEMATOCRIT: 31.5 % — AB (ref 36.0–46.0)
Hemoglobin: 10.8 g/dL — ABNORMAL LOW (ref 12.0–15.0)
Lymphocytes Relative: 18 %
Lymphs Abs: 1.4 10*3/uL (ref 0.7–4.0)
MCH: 27.5 pg (ref 26.0–34.0)
MCHC: 34.3 g/dL (ref 30.0–36.0)
MCV: 80.2 fL (ref 78.0–100.0)
MONO ABS: 0.3 10*3/uL (ref 0.1–1.0)
MONOS PCT: 4 %
NEUTROS ABS: 6.1 10*3/uL (ref 1.7–7.7)
Neutrophils Relative %: 77 %
PLATELETS: 397 10*3/uL (ref 150–400)
RBC: 3.93 MIL/uL (ref 3.87–5.11)
RDW: 14.8 % (ref 11.5–15.5)
WBC: 7.8 10*3/uL (ref 4.0–10.5)

## 2017-06-05 LAB — URINALYSIS, ROUTINE W REFLEX MICROSCOPIC
Bacteria, UA: NONE SEEN
Bilirubin Urine: NEGATIVE
Glucose, UA: NEGATIVE mg/dL
Ketones, ur: NEGATIVE mg/dL
Leukocytes, UA: NEGATIVE
Nitrite: NEGATIVE
PROTEIN: NEGATIVE mg/dL
Specific Gravity, Urine: 1.008 (ref 1.005–1.030)
WBC UA: NONE SEEN WBC/hpf (ref 0–5)
pH: 7 (ref 5.0–8.0)

## 2017-06-05 LAB — I-STAT TROPONIN, ED
Troponin i, poc: 0.01 ng/mL (ref 0.00–0.08)
Troponin i, poc: 0 ng/mL (ref 0.00–0.08)

## 2017-06-05 LAB — CREATININE, SERUM
Creatinine, Ser: 0.72 mg/dL (ref 0.44–1.00)
GFR calc non Af Amer: >60 mL/min (ref >60)

## 2017-06-05 LAB — MAGNESIUM: MAGNESIUM: 1.7 mg/dL (ref 1.7–2.4)

## 2017-06-05 LAB — ECHOCARDIOGRAM COMPLETE

## 2017-06-05 MED ORDER — ONDANSETRON HCL 4 MG/2ML IJ SOLN: 4.0000 mg | Freq: Four times a day (QID) | INTRAMUSCULAR | Status: DC | PRN

## 2017-06-05 MED ORDER — ONDANSETRON HCL 4 MG/2ML IJ SOLN: 4.0000 mg | Freq: Once | INTRAMUSCULAR | Status: DC

## 2017-06-05 MED ORDER — NICOTINE 7 MG/24HR TD PT24
7.0000 mg | MEDICATED_PATCH | Freq: Every day | TRANSDERMAL | Status: DC
  Administered 2017-06-06: 7 mg via TRANSDERMAL
  Filled 2017-06-05: qty 1

## 2017-06-05 MED ORDER — GABAPENTIN 100 MG PO CAPS
100.0000 mg | ORAL_CAPSULE | Freq: Three times a day (TID) | ORAL | Status: DC
  Administered 2017-06-05 – 2017-06-06 (×3): 100 mg via ORAL
  Filled 2017-06-05 (×3): qty 1

## 2017-06-05 MED ORDER — DOCUSATE SODIUM 100 MG PO CAPS
100.0000 mg | ORAL_CAPSULE | Freq: Two times a day (BID) | ORAL | Status: DC
  Administered 2017-06-05 – 2017-06-06 (×2): 100 mg via ORAL
  Filled 2017-06-05 (×2): qty 1

## 2017-06-05 MED ORDER — METHOCARBAMOL 500 MG PO TABS
500.0000 mg | ORAL_TABLET | Freq: Four times a day (QID) | ORAL | Status: DC | PRN
  Administered 2017-06-05: 500 mg via ORAL
  Filled 2017-06-05: qty 1

## 2017-06-05 MED ORDER — AMLODIPINE BESYLATE 5 MG PO TABS
10.0000 mg | ORAL_TABLET | Freq: Every day | ORAL | Status: DC
  Administered 2017-06-06: 10 mg via ORAL
  Filled 2017-06-05: qty 2

## 2017-06-05 MED ORDER — OXYCODONE HCL 5 MG PO TABS
5.0000 mg | ORAL_TABLET | Freq: Two times a day (BID) | ORAL | Status: DC | PRN
  Administered 2017-06-05 – 2017-06-06 (×2): 5 mg via ORAL
  Filled 2017-06-05 (×2): qty 1

## 2017-06-05 MED ORDER — MAGNESIUM SULFATE IN D5W 1-5 GM/100ML-% IV SOLN
1.0000 g | Freq: Once | INTRAVENOUS | Status: AC
  Administered 2017-06-05: 1 g via INTRAVENOUS
  Filled 2017-06-05: qty 100

## 2017-06-05 MED ORDER — AMOXICILLIN-POT CLAVULANATE 500-125 MG PO TABS
1.0000 | ORAL_TABLET | Freq: Three times a day (TID) | ORAL | Status: DC
  Administered 2017-06-05 – 2017-06-06 (×3): 500 mg via ORAL
  Filled 2017-06-05 (×4): qty 1

## 2017-06-05 MED ORDER — MORPHINE SULFATE (PF) 4 MG/ML IV SOLN
4.0000 mg | Freq: Once | INTRAVENOUS | Status: AC
  Administered 2017-06-05: 4 mg via INTRAVENOUS
  Filled 2017-06-05: qty 1

## 2017-06-05 MED ORDER — BUPROPION HCL ER (XL) 150 MG PO TB24
150.0000 mg | ORAL_TABLET | Freq: Every day | ORAL | Status: DC
  Administered 2017-06-06: 150 mg via ORAL
  Filled 2017-06-05: qty 1

## 2017-06-05 MED ORDER — HYDRALAZINE HCL 20 MG/ML IJ SOLN: 10.0000 mg | Freq: Three times a day (TID) | INTRAMUSCULAR | Status: DC | PRN

## 2017-06-05 MED ORDER — ZOLPIDEM TARTRATE 5 MG PO TABS: 5.0000 mg | ORAL_TABLET | Freq: Every evening | ORAL | Status: DC | PRN

## 2017-06-05 MED ORDER — HYDROMORPHONE HCL 1 MG/ML IJ SOLN
1.0000 mg | Freq: Once | INTRAMUSCULAR | Status: AC
  Administered 2017-06-05: 1 mg via INTRAVENOUS
  Filled 2017-06-05: qty 1

## 2017-06-05 MED ORDER — AMLODIPINE BESYLATE 5 MG PO TABS
10.0000 mg | ORAL_TABLET | Freq: Once | ORAL | Status: AC
  Administered 2017-06-05: 10 mg via ORAL
  Filled 2017-06-05: qty 2

## 2017-06-05 MED ORDER — HEPARIN SODIUM (PORCINE) 5000 UNIT/ML IJ SOLN
5000.0000 [IU] | Freq: Three times a day (TID) | INTRAMUSCULAR | Status: DC
  Administered 2017-06-05 – 2017-06-06 (×2): 5000 [IU] via SUBCUTANEOUS
  Filled 2017-06-05 (×2): qty 1

## 2017-06-05 MED ORDER — MIRTAZAPINE 7.5 MG PO TABS
7.5000 mg | ORAL_TABLET | Freq: Every day | ORAL | Status: DC
  Administered 2017-06-05: 7.5 mg via ORAL
  Filled 2017-06-05: qty 1

## 2017-06-05 MED ORDER — POTASSIUM CHLORIDE CRYS ER 20 MEQ PO TBCR
40.0000 meq | EXTENDED_RELEASE_TABLET | Freq: Once | ORAL | Status: AC
  Administered 2017-06-05: 40 meq via ORAL
  Filled 2017-06-05: qty 2

## 2017-06-05 MED ORDER — IOPAMIDOL (ISOVUE-370) INJECTION 76%
INTRAVENOUS | Status: AC
  Administered 2017-06-05: 100 mL
  Filled 2017-06-05: qty 100

## 2017-06-05 MED ORDER — DOXYCYCLINE HYCLATE 100 MG PO TABS
100.0000 mg | ORAL_TABLET | Freq: Two times a day (BID) | ORAL | Status: DC
  Administered 2017-06-05 – 2017-06-06 (×2): 100 mg via ORAL
  Filled 2017-06-05 (×2): qty 1

## 2017-06-05 MED ORDER — NITROGLYCERIN 0.4 MG SL SUBL
0.4000 mg | SUBLINGUAL_TABLET | SUBLINGUAL | Status: DC | PRN
  Administered 2017-06-05 (×3): 0.4 mg via SUBLINGUAL
  Filled 2017-06-05: qty 1

## 2017-06-05 NOTE — H&P (Signed)
[facility] History and Physical  [name] YJE:[id number] DOB: 1961/06/21 DOA: 06/05/2017  Referring physician:  PCP: Scot Jun, FNP   Chief Complaint: CP  HPI: [name] is a 56 y.o. female  past medical history significant for cocaine abuse, hypertension, migraines, report arthritis who presents emergency room with chief complaint of chest pain. Chest pain started yesterday. Patient tried to sleep. The pain that did not go away with rest. She then came to emergency room via [place]. Patient rates the chest pain severe. Location midsternal. Worse with deep inspiration. Denies any recent chest trauma. Denies history of heart attack. Hasn't been coughing recently. Does not take aspirin regularly. With episodes of chest pain patient has had intermittent diaphoresis. Patient also having nausea. Chest pain does not radiate.  ED course: Initial troponin negative. CT chest showed pericardial effusion. Hospitalist consulted for admission.   Review of Systems:  As per HPI otherwise 10 point review of systems negative.    Past Medical History:  Diagnosis Date  . CAP (community acquired pneumonia) 03/2014 X 2  . Cocaine abuse    ongoing with resultant vaculitis.  . Depression   . Headache    "weekly" (07/29/2016)  . Hypertension   . Inflammatory arthritis   . Migraines    "probably 5-6/yr" (07/29/2016)  . Normocytic anemia    BL Hgb 9.8-12. Last anemia panel 04/2010 - showing Fe 19, ferritin 101.  Pt on monthly B12 injections  . Rheumatoid arthritis(714.0)    patient reported  . 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 Followed by Dr. Louanne Skye   Past Surgical History:  Procedure Laterality Date  . AMPUTATION Left 05/22/2016   Procedure: AMPUTATION LEFT LONG FINGER;  Surgeon: Marybelle Killings, MD;  Location: [place];  Service: Orthopedics;  Laterality: Left;  . AMPUTATION Bilateral 04/10/2017   Procedure: AMPUTATION BELOW  KNEE;  Surgeon: Newt Minion, MD;  Location: [place];  Service: Orthopedics;  Laterality: Bilateral;  . HERNIA REPAIR     "stomach"  . I&D EXTREMITY Right 09/26/2015   Procedure: IRRIGATION AND DEBRIDEMENT LEG WOUND  VAC PLACEMENT.;  Surgeon: Loel Lofty Dillingham, DO;  Location: [place];  Service: Plastics;  Laterality: Right;  . INCISION AND DRAINAGE OF WOUND Bilateral 10/20/2016   Procedure: IRRIGATION AND DEBRIDEMENT WOUND BILATERAL;  Surgeon: Edrick Kins, DPM;  Location: [place];  Service: Podiatry;  Laterality: Bilateral;  . IRRIGATION AND DEBRIDEMENT ABSCESS Bilateral 09/26/2013   Procedure: DEBRIDEMENT ULCERS BILATERAL THIGHS;  Surgeon: Gwenyth Ober, MD;  Location: [place];  Service: General;  Laterality: Bilateral;  . SKIN BIOPSY Bilateral    shin nodules   Social History:  reports that she has been smoking Cigarettes.  She has a 4.56 pack-year smoking history. She has never used smokeless tobacco. She reports that she uses drugs, including "Crack" cocaine and Cocaine. She reports that she does not drink alcohol.  Allergies  Allergen Reactions  . Acetaminophen Swelling and Other (See Comments)    Reaction:  Eyelid swelling  . Other Other (See Comments)    Pt states she is allergic to a blood pressure medication, but does not know the reaction or medication    Family History  Problem Relation Age of Onset  . Breast cancer Mother        Breast cancer  . Alcohol abuse Mother   . Colon cancer Maternal Aunt 62  . Alcohol abuse Father  Prior to Admission medications   Medication Sig Start Date End Date Taking? Authorizing Provider  amLODipine (NORVASC) 10 MG tablet Take 1 tablet (10 mg total) by mouth daily. 05/02/17  Yes Scot Jun, FNP  amoxicillin-clavulanate (AUGMENTIN) 500-125 MG tablet Take 1 tablet by mouth 3 (three) times daily. 04/02/17  Yes [provider]  buPROPion (WELLBUTRIN XL) 150 MG 24 hr tablet Take 1 tablet (150 mg total) by mouth daily. 05/02/17   Yes Scot Jun, FNP  docusate sodium (COLACE) 100 MG capsule Take 1 capsule (100 mg total) by mouth 2 (two) times daily. 05/02/17  Yes Scot Jun, FNP  doxycycline (VIBRA-TABS) 100 MG tablet Take 1 tablet (100 mg total) by mouth every 12 (twelve) hours. 05/24/17  Yes Suzan Slick, NP  ferrous sulfate 325 (65 FE) MG tablet Take 1 tablet (325 mg total) by mouth 2 (two) times daily with a meal. 05/02/17  Yes Scot Jun, FNP  gabapentin (NEURONTIN) 100 MG capsule Take 1 capsule (100 mg total) by mouth 3 (three) times daily. 04/27/17  Yes Scot Jun, FNP  methocarbamol (ROBAXIN) 500 MG tablet Take 1 tablet (500 mg total) by mouth every 6 (six) hours as needed for muscle spasms. 05/02/17  Yes Scot Jun, FNP  mirtazapine (REMERON) 7.5 MG tablet Take 1 tablet (7.5 mg total) by mouth at bedtime. 05/02/17  Yes Scot Jun, FNP  Multiple Vitamin (MULTIVITAMIN WITH MINERALS) TABS tablet Take 1 tablet by mouth daily. 05/02/17  Yes Scot Jun, FNP  nicotine (NICODERM CQ - DOSED IN MG/24 HR) 7 mg/24hr patch Place 1 patch (7 mg total) onto the skin daily. 05/02/17  Yes Scot Jun, FNP  oxyCODONE (OXY IR/ROXICODONE) 5 MG immediate release tablet Take 1 tablet (5 mg total) by mouth every 12 (twelve) hours as needed for breakthrough pain. 05/24/17  Yes Dondra Prader R, NP  polyethylene glycol (MIRALAX / GLYCOLAX) packet Take 17 g by mouth daily as needed for mild constipation. 05/02/17  Yes Scot Jun, FNP  silver sulfADIAZINE (SILVADENE) 1 % cream Apply topically daily. 04/17/17  Yes Flora Lipps   Physical Exam: Vitals:   06/05/17 1245 06/05/17 1315 06/05/17 1330 06/05/17 1345  BP: (!) 168/115 (!) 165/99 (!) 150/97 (!) 148/86  Pulse: 84 100 (!) 109 94  Resp: 20 (!) 25 (!) 30 16  Temp:      TempSrc:      SpO2: 100% 100% 100% 100%    Wt Readings from Last 3 Encounters:  04/15/17 38.7 kg (85 lb 5.1 oz)  04/13/17 47.7 kg (105 lb 1.6 oz)    03/30/17 35.6 kg (78 lb 6.4 oz)    General:  Appears calm and comfortable; A&Ox3 Eyes:  PERRL, EOMI, normal lids, iris ENT:  grossly normal hearing, lips & tongue Neck:  no LAD, masses or thyromegaly Cardiovascular:  RRR, no m/r/g. No LE edema.  Respiratory:  CTA bilaterally, no w/r/r. Normal respiratory effort. Abdomen:  soft, ntnd Skin:  no rash or induration seen on limited exam, skin maceration on stumps of bilateral BKAs Musculoskeletal:  grossly normal tone BUE/BLE Psychiatric:  grossly normal mood and affect, speech fluent and appropriate Neurologic:  CN 2-12 grossly intact, moves all extremities in coordinated fashion.          Labs on Admission:  Basic Metabolic Panel:  Recent Labs Lab 06/05/17 0850  NA 137  K 3.2*  CL 102  CO2 21*  GLUCOSE 98  BUN <  5*  CREATININE 0.58  CALCIUM 9.4  MG 1.7   Liver Function Tests:  Recent Labs Lab 06/05/17 0850  AST 19  ALT 11*  ALKPHOS 74  BILITOT 0.3  PROT 6.8  ALBUMIN 3.2*   No results for input(s): LIPASE, AMYLASE in the last 168 hours. No results for input(s): AMMONIA in the last 168 hours. CBC:  Recent Labs Lab 06/05/17 0850  WBC 7.8  NEUTROABS 6.1  HGB 10.8*  HCT 31.5*  MCV 80.2  PLT 397   Cardiac Enzymes: No results for input(s): CKTOTAL, CKMB, CKMBINDEX, TROPONINI in the last 168 hours.  BNP (last 3 results) No results for input(s): BNP in the last 8760 hours.  ProBNP (last 3 results) No results for input(s): PROBNP in the last 8760 hours.   Creatinine clearance cannot be calculated (Unknown ideal weight.)  CBG: No results for input(s): GLUCAP in the last 168 hours.  Radiological Exams on Admission: Dg Chest 2 View  Result Date: 06/05/2017 CLINICAL DATA:  Pt complains of mid-sternal chest pain onset at 0630 today. Hx of HTN. Smoker EXAM: CHEST - 2 VIEW COMPARISON:  04/08/2017 FINDINGS: Stable linear scarring or subsegmental atelectasis in the right upper lobe. Lungs are otherwise clear.  Heart size and mediastinal contours are within normal limits. No effusion. Visualized bones unremarkable. IMPRESSION: No acute cardiopulmonary disease. Electronically Signed   By: Lucrezia Europe M.D.   On: 06/05/2017 08:57   Ct Angio Chest Pe W And/or Wo Contrast  Result Date: 06/05/2017 CLINICAL DATA:  Acute onset of left-sided chest pain that began at 1 o'clock a.m. this morning. EXAM: CT ANGIOGRAPHY CHEST WITH CONTRAST TECHNIQUE: Multidetector CT imaging of the chest was performed using the standard protocol during bolus administration of intravenous contrast. Multiplanar CT image reconstructions and MIPs were obtained to evaluate the vascular anatomy. CONTRAST:  100 mL Isovue 370 IV. COMPARISON:  CTA chest 10/12/2011. FINDINGS: Cardiovascular: Excellent contrast opacification of the pulmonary arteries. No filling defects within either main pulmonary artery or their segmental branches in either lung to suggest pulmonary embolism. Heart size upper normal slightly enlarged with left ventricular hypertrophy. Mild right coronary artery atherosclerosis. Small pericardial effusion. Mild to moderate atherosclerosis involving the thoracic and upper abdominal aorta without evidence of aneurysm. Bovine aortic arch anatomy (left common carotid artery arises from the innominate artery). Mediastinum/Nodes: Upper normal size right hilar node measuring approximately 1.4 cm. No pathologically enlarged mediastinal, hilar or axillary lymph nodes. No mediastinal masses. Normal-appearing esophagus. Thyroid gland unremarkable. Lungs/Pleura: Emphysematous changes throughout both lungs scarring involving the right upper lobe, unchanged. Lungs otherwise clear without confluent airspace consolidation or interstitial disease. No pulmonary parenchymal nodules or masses. No pleural effusions. Central airways patent without significant bronchial wall thickening. Upper Abdomen: Unremarkable for the early arterial phase of enhancement.  Musculoskeletal: Mild degenerative disc disease and spondylosis involving the mid thoracic spine. Review of the MIP images confirms the above findings. IMPRESSION: 1. No evidence of pulmonary embolism. 2. COPD/emphysema. Right upper lobe scarring. No acute cardiopulmonary disease. 3. Small pericardial effusion. Borderline cardiomegaly with left ventricular hypertrophy and mild right coronary atherosclerosis. Aortic Atherosclerosis (ICD10-I70.0) and Emphysema (ICD10-J43.9). Electronically Signed   By: Evangeline Dakin M.D.   On: 06/05/2017 13:08    EKG: Independently reviewed. Sinus tachycardia. No STEMI.  Assessment/Plan Principal Problem:   Chest pain Active Problems:   Essential hypertension   1) CP - serial trop ordered, initial neg - prn EKG CP - prn moprhine CP - prn ntg cp - asa in  ED and QD - ECHO ordered - tele bed, cardiac monitoring - ambien for sleep prn - zofran prn for nausea CXR neg  Pericardial effusion No muiffle dheart sounds, hypotension of JVD Will get echo  Low K Replaced in ED Recheck in AM  Skin infection Doxy and Augmentin day 12/12  Constipation Cont colace  Anemia Hold iron tabs  Chronic pain Cont robaxin and gabapentin  Tobacco abuse Nicotine patch  Mood Disorder Cont Remeron    Code Status: FULL  DVT Prophylaxis: heparin Family Communication: none at bedside Disposition Plan: Pending Improvement  Status: obs tele  Elwin Mocha, MD Family Medicine [facility] [email or web address] Password [email or web address]

## 2017-06-05 NOTE — Progress Notes (Signed)
  Echocardiogram 2D Echocardiogram has been performed.  [name] 06/05/2017, 3:54 PM

## 2017-06-05 NOTE — ED Notes (Signed)
PA at bedside.

## 2017-06-05 NOTE — ED Triage Notes (Addendum)
Received pt from home with c/o woke up with center chest pain at 0630 today. Pt given 324 mg of ASA by EMS. Pt also c/o B/L leg pain, pt had amputation done "3 weeks ago" and has pain ever since. Pt reports pain increases with palpation, movement and breathing. Pt has multiple wounds on legs.

## 2017-06-05 NOTE — ED Provider Notes (Signed)
[name] DEPT Provider Note   CSN: [id number] Arrival date & time: 06/05/17  0758     History   Chief Complaint Chief Complaint  Patient presents with  . Chest Pain  . Leg Pain    HPI NASIRAH SACHS is a 56 y.o. female.  HPI   MADAY GUARINO is a 56 y.o. female, with a history of HTN, vasculitis, anemia, and cocaine abuse, presenting to the ED with chest pain that began about an hour prior to arrival, waking patient from her sleep. Pain is central chest, aching, 10/10, nonradiating. Has not had this pain before. The pain does not change with changes in position. Does endorse "a little bit of a cough" beginning yesterday with yellow sputum.  Endorses loose stools, but states this has been constant since she started her doxycycline about three weeks ago. Has had one stool a day. She also notes she has not yet filled her prescription of doxycycline prescribed on 8/20 to extend her course to a 4 weeks course. She states, "I have just been taking my left over antibiotic from when I was discharged from the hospital last time. 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 Sharol Given.  Crack cocaine use with last use three days ago. Denies IV drug use. Patient was given 324 mg ASA via EMS prior to arrival.   Denies SOB, N/V, dizziness, back pain, abdominal pain, fever/chills, or any other complaints. Denies history of DVT/PE.   Past Medical History:  Diagnosis Date  . CAP (community acquired pneumonia) 03/2014 X 2  . Cocaine abuse    ongoing with resultant vaculitis.  . Depression   . Headache    "weekly" (07/29/2016)  . Hypertension   . Inflammatory arthritis   . Migraines    "probably 5-6/yr" (07/29/2016)  . Normocytic anemia    BL Hgb 9.8-12. Last anemia panel 04/2010 - showing Fe 19, ferritin 101.  Pt on monthly B12 injections  . Rheumatoid arthritis(714.0)    patient reported  . 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 Followed by Dr. Louanne Skye    Patient Active Problem List   Diagnosis Date Noted  . Hypertensive crisis   . Phantom limb pain ([place])   . S/P bilateral below knee amputation ([place]) 04/13/2017  . Tobacco abuse   . Post-operative pain   . Acute blood loss anemia   . 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 Wound infection 03/30/2017  . AKI (acute kidney injury) ([place]) 02/07/2017  . Acute kidney injury ([place]) 02/06/2017  . Wound healing, delayed   . Osteomyelitis ([place])   . Ulcer with gangrene, with necrosis of muscle ([place])   . Skin ulcer of lower leg with necrosis of muscle, unspecified laterality ([place]) 06/04/2015  . Protein-calorie malnutrition, severe ([place]) 01/15/2015  . MDD (major depressive disorder), recurrent episode, severe ([place]) 10/16/2014  . Cocaine use disorder, severe, dependence ([place]) 10/10/2014  . 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 Essential hypertension 02/26/2010    Past Surgical History:  Procedure Laterality Date  . AMPUTATION Left 05/22/2016   Procedure: AMPUTATION LEFT LONG FINGER;  Surgeon: Marybelle Killings, MD;  Location: [facility];  Service: Orthopedics;  Laterality: Left;  . AMPUTATION Bilateral 04/10/2017   Procedure: AMPUTATION BELOW KNEE;  Surgeon: Newt Minion, MD;  Location: [facility];  Service: Orthopedics;  Laterality: Bilateral;  .  HERNIA REPAIR     "stomach"  . I&D EXTREMITY Right 09/26/2015   Procedure: IRRIGATION AND DEBRIDEMENT LEG WOUND  VAC PLACEMENT.;  Surgeon: Loel Lofty Dillingham, DO;  Location: [facility];  Service: Plastics;  Laterality: Right;  . INCISION AND DRAINAGE OF WOUND Bilateral 10/20/2016   Procedure: IRRIGATION AND DEBRIDEMENT WOUND BILATERAL;  Surgeon: Edrick Kins, DPM;  Location: [facility];  Service: Podiatry;  Laterality: Bilateral;  . IRRIGATION AND DEBRIDEMENT ABSCESS Bilateral 09/26/2013   Procedure: DEBRIDEMENT ULCERS BILATERAL THIGHS;  Surgeon:  Gwenyth Ober, MD;  Location: [facility];  Service: General;  Laterality: Bilateral;  . SKIN BIOPSY Bilateral    shin nodules    OB History    No data available       Home Medications    Prior to Admission medications   Medication Sig Start Date End Date Taking? Authorizing Provider  amLODipine (NORVASC) 10 MG tablet Take 1 tablet (10 mg total) by mouth daily. 05/02/17  Yes Scot Jun, FNP  amoxicillin-clavulanate (AUGMENTIN) 500-125 MG tablet Take 1 tablet by mouth 3 (three) times daily. 04/02/17  Yes [provider]  buPROPion (WELLBUTRIN XL) 150 MG 24 hr tablet Take 1 tablet (150 mg total) by mouth daily. 05/02/17  Yes Scot Jun, FNP  docusate sodium (COLACE) 100 MG capsule Take 1 capsule (100 mg total) by mouth 2 (two) times daily. 05/02/17  Yes Scot Jun, FNP  doxycycline (VIBRA-TABS) 100 MG tablet Take 1 tablet (100 mg total) by mouth every 12 (twelve) hours. 05/24/17  Yes Suzan Slick, NP  ferrous sulfate 325 (65 FE) MG tablet Take 1 tablet (325 mg total) by mouth 2 (two) times daily with a meal. 05/02/17  Yes Scot Jun, FNP  gabapentin (NEURONTIN) 100 MG capsule Take 1 capsule (100 mg total) by mouth 3 (three) times daily. 04/27/17  Yes Scot Jun, FNP  methocarbamol (ROBAXIN) 500 MG tablet Take 1 tablet (500 mg total) by mouth every 6 (six) hours as needed for muscle spasms. 05/02/17  Yes Scot Jun, FNP  mirtazapine (REMERON) 7.5 MG tablet Take 1 tablet (7.5 mg total) by mouth at bedtime. 05/02/17  Yes Scot Jun, FNP  Multiple Vitamin (MULTIVITAMIN WITH MINERALS) TABS tablet Take 1 tablet by mouth daily. 05/02/17  Yes Scot Jun, FNP  nicotine (NICODERM CQ - DOSED IN MG/24 HR) 7 mg/24hr patch Place 1 patch (7 mg total) onto the skin daily. 05/02/17  Yes Scot Jun, FNP  oxyCODONE (OXY IR/ROXICODONE) 5 MG immediate release tablet Take 1 tablet (5 mg total) by mouth every 12 (twelve) hours as needed for  breakthrough pain. 05/24/17  Yes Dondra Prader R, NP  polyethylene glycol (MIRALAX / GLYCOLAX) packet Take 17 g by mouth daily as needed for mild constipation. 05/02/17  Yes Scot Jun, FNP  silver sulfADIAZINE (SILVADENE) 1 % cream Apply topically daily. 04/17/17  Yes Love, Ivan Anchors, PA-C    Family History Family History  Problem Relation Age of Onset  . Breast cancer Mother        Breast cancer  . Alcohol abuse Mother   . Colon cancer Maternal Aunt 35  . Alcohol abuse Father     Social History Social History  Substance Use Topics  . Smoking status: Current Every Day Smoker    Packs/day: 0.12    Years: 38.00    Types: Cigarettes  . Smokeless tobacco: Never Used     Comment: 2 A DAY  .  Alcohol use No     Allergies   Acetaminophen and Other   Review of Systems Review of Systems  Constitutional: Negative for chills, diaphoresis and fever.  Respiratory: Negative for shortness of breath.   Cardiovascular: Positive for chest pain.  Gastrointestinal: Negative for abdominal pain, constipation, diarrhea, nausea and vomiting.  Musculoskeletal: Negative for back pain.  Neurological: Negative for dizziness, weakness, light-headedness and numbness.  All other systems reviewed and are negative.    Physical Exam Updated Vital Signs BP (!) 175/98 (BP Location: Right Arm)   Pulse (!) 103   Temp 99.1 F (37.3 C) (Oral)   Resp (!) 24   SpO2 100%   Physical Exam  Constitutional: She appears well-developed and well-nourished. No distress.  HENT:  Head: Normocephalic and atraumatic.  Eyes: Conjunctivae are normal.  Neck: Neck supple.  Cardiovascular: Normal rate, regular rhythm, normal heart sounds and intact distal pulses.   Pulmonary/Chest: Effort normal and breath sounds normal. No respiratory distress. She exhibits tenderness (to left of sternum).  Abdominal: Soft. There is no tenderness. There is no guarding.  Musculoskeletal: She exhibits tenderness. She exhibits no  edema.  Lymphadenopathy:    She has no cervical adenopathy.  Neurological: She is alert.  Skin: Skin is warm and dry. Capillary refill takes less than 2 seconds. She is not diaphoretic.  Right BKA: Shallow wound with granulation tissue noted to the anterior inferior surface. No noted swelling, surrounding erythema, active hemorrhage, or discharge, purulent or otherwise.  Left BKA: One medial and one lateral wound. No noted swelling, surrounding erythema, active hemorrhage, or discharge, purulent or otherwise.  Psychiatric: She has a normal mood and affect. Her behavior is normal.  Nursing note and vitals reviewed.            ED Treatments / Results  Labs (all labs ordered are listed, but only abnormal results are displayed) Labs Reviewed  COMPREHENSIVE METABOLIC PANEL - Abnormal; Notable for the following:       Result Value   Potassium 3.2 (*)    CO2 21 (*)    BUN <5 (*)    Albumin 3.2 (*)    ALT 11 (*)    All other components within normal limits  CBC WITH DIFFERENTIAL/PLATELET - Abnormal; Notable for the following:    Hemoglobin 10.8 (*)    HCT 31.5 (*)    All other components within normal limits  URINALYSIS, ROUTINE W REFLEX MICROSCOPIC - Abnormal; Notable for the following:    APPearance CLOUDY (*)    Hgb urine dipstick SMALL (*)    Squamous Epithelial / LPF 0-5 (*)    All other components within normal limits  CULTURE, BLOOD (ROUTINE X 2)  CULTURE, BLOOD (ROUTINE X 2)  PROTIME-INR  MAGNESIUM  TROPONIN I  TROPONIN I  TROPONIN I  I-STAT TROPONIN, ED  I-STAT TROPONIN, ED    EKG  EKG Interpretation  Date/Time:  Saturday June 05 2017 08:11:17 EDT Ventricular Rate:  102 PR Interval:    QRS Duration: 89 QT Interval:  397 QTC Calculation: 518 R Axis:   81 Text Interpretation:  Sinus tachycardia Biatrial enlargement Left ventricular hypertrophy Anterior ST elevation, probably due to LVH Prolonged QT interval when compared to prior, similar morphology  of T waves in leds V2-V3 No STEMI Confirmed by Antony Blackbird [phone]) on 06/05/2017 8:23:41 AM       EKG Interpretation  Date/Time:  Saturday June 05 2017 08:11:17 EDT Ventricular Rate:  102 PR Interval:    QRS  Duration: 89 QT Interval:  397 QTC Calculation: 518 R Axis:   81 Text Interpretation:  Sinus tachycardia Biatrial enlargement Left ventricular hypertrophy Anterior ST elevation, probably due to LVH Prolonged QT interval when compared to prior, similar morphology of T waves in leds V2-V3 No STEMI Confirmed by Antony Blackbird [phone]) on 06/05/2017 8:23:41 AM       Radiology Dg Chest 2 View  Result Date: 06/05/2017 CLINICAL DATA:  Pt complains of mid-sternal chest pain onset at 0630 today. Hx of HTN. Smoker EXAM: CHEST - 2 VIEW COMPARISON:  04/08/2017 FINDINGS: Stable linear scarring or subsegmental atelectasis in the right upper lobe. Lungs are otherwise clear. Heart size and mediastinal contours are within normal limits. No effusion. Visualized bones unremarkable. IMPRESSION: No acute cardiopulmonary disease. Electronically Signed   By: Lucrezia Europe M.D.   On: 06/05/2017 08:57   Ct Angio Chest Pe W And/or Wo Contrast  Result Date: 06/05/2017 CLINICAL DATA:  Acute onset of left-sided chest pain that began at 1 o'clock a.m. this morning. EXAM: CT ANGIOGRAPHY CHEST WITH CONTRAST TECHNIQUE: Multidetector CT imaging of the chest was performed using the standard protocol during bolus administration of intravenous contrast. Multiplanar CT image reconstructions and MIPs were obtained to evaluate the vascular anatomy. CONTRAST:  100 mL Isovue 370 IV. COMPARISON:  CTA chest 10/12/2011. FINDINGS: Cardiovascular: Excellent contrast opacification of the pulmonary arteries. No filling defects within either main pulmonary artery or their segmental branches in either lung to suggest pulmonary embolism. Heart size upper normal slightly enlarged with left ventricular hypertrophy. Mild right coronary artery  atherosclerosis. Small pericardial effusion. Mild to moderate atherosclerosis involving the thoracic and upper abdominal aorta without evidence of aneurysm. Bovine aortic arch anatomy (left common carotid artery arises from the innominate artery). Mediastinum/Nodes: Upper normal size right hilar node measuring approximately 1.4 cm. No pathologically enlarged mediastinal, hilar or axillary lymph nodes. No mediastinal masses. Normal-appearing esophagus. Thyroid gland unremarkable. Lungs/Pleura: Emphysematous changes throughout both lungs scarring involving the right upper lobe, unchanged. Lungs otherwise clear without confluent airspace consolidation or interstitial disease. No pulmonary parenchymal nodules or masses. No pleural effusions. Central airways patent without significant bronchial wall thickening. Upper Abdomen: Unremarkable for the early arterial phase of enhancement. Musculoskeletal: Mild degenerative disc disease and spondylosis involving the mid thoracic spine. Review of the MIP images confirms the above findings. IMPRESSION: 1. No evidence of pulmonary embolism. 2. COPD/emphysema. Right upper lobe scarring. No acute cardiopulmonary disease. 3. Small pericardial effusion. Borderline cardiomegaly with left ventricular hypertrophy and mild right coronary atherosclerosis. Aortic Atherosclerosis (ICD10-I70.0) and Emphysema (ICD10-J43.9). Electronically Signed   By: Evangeline Dakin M.D.   On: 06/05/2017 13:08    Procedures Procedures (including critical care time)  Medications Ordered in ED Medications  nitroGLYCERIN (NITROSTAT) SL tablet 0.4 mg (0.4 mg Sublingual Given 06/05/17 1340)  morphine 4 MG/ML injection 4 mg (4 mg Intravenous Given 06/05/17 0927)  magnesium sulfate IVPB 1 g 100 mL (0 g Intravenous Stopped 06/05/17 1239)  potassium chloride SA (K-DUR,KLOR-CON) CR tablet 40 mEq (40 mEq Oral Given 06/05/17 1236)  iopamidol (ISOVUE-370) 76 % injection (100 mLs  Contrast Given 06/05/17 1200)    HYDROmorphone (DILAUDID) injection 1 mg (1 mg Intravenous Given 06/05/17 1239)  amLODipine (NORVASC) tablet 10 mg (10 mg Oral Given 06/05/17 1238)     Initial Impression / Assessment and Plan / ED Course  I have reviewed the triage vital signs and the nursing notes.  Pertinent labs & imaging results that were available during my care of  the patient were reviewed by me and considered in my medical decision making (see chart for details).  Clinical Course as of Jun 05 1454  Sat Jun 05, 2017  0830 Patient in xray.  [SJ]  [id number] Patient states she has not yet taken her medications this morning.  BP: (!) 175/98 [SJ]  1405 Patient is noted to be sleeping upon reassessment. Patient states her pain has improved, rated about a 4/10.   [SJ]  Portal with Dr. Aggie Moats, hospitalist, who agrees to admit the patient. Plans to work up the small pericardial effusion as well. Repeat troponin and EKG pending.  [SJ]    Clinical Course User Index [SJ] Joy, Shawn C, PA-C    Patient presents with chest pain. Patient is nontoxic appearing, afebrile, not hypotensive, maintains SPO2 of 100% on room air. Patient borderline tachycardic and only tachypneic when complaining of pain. HEART score is 4, indicating moderate risk for a cardiac event. Patient's recent surgical history and today's sudden onset of chest pain makes patient, in my opinion, too high risk for d-dimer rule out of PE. CT angiogram of the chest reveals no PE, however, does reveal a small pericardial effusion. Patient does not appear to be unstable. She has no components of Beck's triad. There were no acute changes on her EKG when compared to previous other than a prolonged QT interval. Patient to be admitted for further workup of her chest pain and her pericardial effusion.  Findings and plan of care discussed with Antony Blackbird, MD.    Vitals:   06/05/17 1245 06/05/17 1315 06/05/17 1330 06/05/17 1345  BP: (!) 168/115 (!) 165/99 (!) 150/97 (!) 148/86   Pulse: 84 100 (!) 109 94  Resp: 20 (!) 25 (!) 30 16  Temp:      TempSrc:      SpO2: 100% 100% 100% 100%    Recent patient care history: Patient was last seen by Dr. Jess Barters office on August 20. There were two small areas of purulence noted to the suture lines of the left BKA. Her doxycycline was extended to a four-week course and was advised to follow-up in 2 weeks. She also requests a refill of her 5 mg oxycodone, of which 14 tablets were prescribed. Patient was then seen in the ED on August 26 with complaints of pain. There were no signs of purulence or cellulitis noted to the wounds at that time. She was discharged and advised to follow up with Dr. Jess Barters office in the coming week.     Final Clinical Impressions(s) / ED Diagnoses   Final diagnoses:  Prolonged Q-T interval on ECG  Chest pain, unspecified type  Pericardial effusion    New Prescriptions New Prescriptions   No medications on file     Layla Maw 06/05/17 1456    Tegeler, Gwenyth Allegra, MD 06/05/17 [phone]

## 2017-06-06 ENCOUNTER — Encounter (HOSPITAL_COMMUNITY): Payer: Self-pay | Admitting: Family Medicine

## 2017-06-06 DIAGNOSIS — F1410 Cocaine abuse, uncomplicated: Secondary | ICD-10-CM | POA: Diagnosis not present

## 2017-06-06 DIAGNOSIS — R079 Chest pain, unspecified: Secondary | ICD-10-CM | POA: Diagnosis not present

## 2017-06-06 DIAGNOSIS — F1910 Other psychoactive substance abuse, uncomplicated: Secondary | ICD-10-CM | POA: Diagnosis not present

## 2017-06-06 DIAGNOSIS — I10 Essential (primary) hypertension: Secondary | ICD-10-CM | POA: Diagnosis not present

## 2017-06-06 LAB — BASIC METABOLIC PANEL
ANION GAP: 11 (ref 5–15)
BUN: 14 mg/dL (ref 6–20)
CHLORIDE: 102 mmol/L (ref 101–111)
CO2: 24 mmol/L (ref 22–32)
Calcium: 9.4 mg/dL (ref 8.9–10.3)
Creatinine, Ser: 0.73 mg/dL (ref 0.44–1.00)
GFR calc Af Amer: >60 mL/min (ref >60)
GFR calc non Af Amer: >60 mL/min (ref >60)
GLUCOSE: 107 mg/dL — AB (ref 65–99)
POTASSIUM: 3.8 mmol/L (ref 3.5–5.1)
Sodium: 137 mmol/L (ref 135–145)

## 2017-06-06 LAB — TROPONIN I

## 2017-06-06 NOTE — Consult Note (Signed)
[facility] Nurse wound consult note Reason for Consult: Bilateral stump wounds, full thickness.  Also wound at right lateral knee.  Patient is known to me and to our service.  Last seen by this writer two months ago prior to bilateral transtibial amputation by Dr. Meridee Score. Also seen by Plastics (Dr. Guadlupe Spanish) in the past 6 months. Wound type: Trauma, surgical Pressure Injury POA: N/A Measurement: Right lateral knee lesion:  1cm round with depth obscured by the presence of dried serum.  Right lateral stump wound (full thickness) measures 1cm x 2cm x 0.1cm with red, dry wound bed. Dried serum covers small places along right incision line.   Left lateral stump full thickness wound: 2cm x 2.5cm x 0.2cm with red, dry wound bed and purple discoloration at the lateral margin of wound (0.5cm x 0.8cm area). Wound bed: Red, dry with purple discoloration at the left lateral stump wound.  Drainage (amount, consistency, odor) None Periwound: Intact, dry with evidence of recent wound healing (scar) at remainder of bilateral incision lines Dressing procedure/placement/frequency: I will implement a conservative POC for stump wound and right lateral knee wound consisting of cleansing with saline, gently patting dry and covering affected areas with a white petrolatum gauze dressing twice daily.  Securement will be with Kerlix roll gauze/paper tape.  Patient is taught that she remains at risk for pressure injury and skin breakdown related to moisture and she indicates understanding to lie in the side lying position and minimize time spent in the supine position. I have asked Nursing via the Orders to place a prophylactic foam dressing to the sacrum. [facility] nursing team will not follow, but will remain available to this patient, the nursing and medical teams.  Please re-consult if needed. Thanks, Maudie Flakes, MSN, RN, [facility], Arther Abbott  Pager# [phone]

## 2017-06-06 NOTE — Plan of Care (Signed)
Problem: Pain Managment: Goal: General experience of comfort will improve Outcome: Progressing No c/o chest pain. Complained of bilateral stump pain at the beginning of the shift.  PRN pain medication not administered because it was too early.  Patient fell asleep shortly afterwards and is still asleep.

## 2017-06-06 NOTE — Discharge Summary (Signed)
Physician Discharge Summary  [name] ENI:[id number] DOB: Aug 21, 1961 DOA: 06/05/2017  PCP: Scot Jun, FNP Orthopedist: Dr. Sharol Given   Admit date: 06/05/2017 Discharge date: 06/06/2017  Admitted From: HOME  Disposition: HOME   Recommendations for Outpatient Follow-up:  1. Follow up with PCP in 1 weeks 2. Follow up with Dr. Sharol Given in 2 days as already scheduled 3. Please obtain BMP/CBC in one week  Discharge Condition: STABLE  CODE STATUS: FULL    Brief Hospitalization Summary: Please see all hospital notes, images, labs for full details of the hospitalization.  HPI: [name] is a 56 y.o. female  past medical history significant for cocaine abuse, hypertension, migraines, report arthritis who presents emergency room with chief complaint of chest pain. Chest pain started yesterday. Patient tried to sleep. The pain that did not go away with rest. She then came to emergency room via [place]. Patient rates the chest pain severe. Location midsternal. Worse with deep inspiration. Denies any recent chest trauma. Denies history of heart attack. Hasn't been coughing recently. Does not take aspirin regularly. With episodes of chest pain patient has had intermittent diaphoresis. Patient also having nausea. Chest pain does not radiate.  ED course: Initial troponin negative. CT chest showed pericardial effusion. Hospitalist consulted for admission.  Pt was admitted for observation. Serial troponins negative x 3, CTA chest negative for PE, echocardiogram did not see any pericardial effusion.  Pt tested positive for cocaine and marijuana during this observation.  Potassium was repleted.  Telemetry stable.  Pt was seen by wound care nurse and has appt to see Dr. Sharol Given in 2 days.  Pt stable for discharge home with follow up with PCP in 1 week.  QT interval normal.  I recommend she not be prescribed narcotics given she was positive for cocaine and marijuana but defer to PCP.     Discharge Diagnoses:   Principal Problem:   Chest pain Active Problems:   Essential hypertension  Discharge Instructions: Discharge Instructions    AMB referral to wound care center    Complete by:  As directed    Call MD for:  difficulty breathing, headache or visual disturbances    Complete by:  As directed    Call MD for:  extreme fatigue    Complete by:  As directed    Call MD for:  hives    Complete by:  As directed    Call MD for:  persistant dizziness or light-headedness    Complete by:  As directed    Call MD for:  persistant nausea and vomiting    Complete by:  As directed    Call MD for:  redness, tenderness, or signs of infection (pain, swelling, redness, odor or green/yellow discharge around incision site)    Complete by:  As directed    Call MD for:  severe uncontrolled pain    Complete by:  As directed    Call MD for:  temperature >100.4    Complete by:  As directed    Increase activity slowly    Complete by:  As directed      Allergies as of 06/06/2017      Reactions   Acetaminophen Swelling, Other (See Comments)   Reaction:  Eyelid swelling   Other Other (See Comments)   Pt states she is allergic to a blood pressure medication, but does not know the reaction or medication      Medication List    STOP taking these medications  oxyCODONE 5 MG immediate release tablet Commonly known as:  Oxy IR/ROXICODONE     TAKE these medications   amLODipine 10 MG tablet Commonly known as:  NORVASC Take 1 tablet (10 mg total) by mouth daily.   amoxicillin-clavulanate 500-125 MG tablet Commonly known as:  AUGMENTIN Take 1 tablet by mouth 3 (three) times daily.   buPROPion 150 MG 24 hr tablet Commonly known as:  WELLBUTRIN XL Take 1 tablet (150 mg total) by mouth daily.   docusate sodium 100 MG capsule Commonly known as:  COLACE Take 1 capsule (100 mg total) by mouth 2 (two) times daily.   doxycycline 100 MG tablet Commonly known as:  VIBRA-TABS Take 1 tablet (100 mg total) by  mouth every 12 (twelve) hours.   ferrous sulfate 325 (65 FE) MG tablet Take 1 tablet (325 mg total) by mouth 2 (two) times daily with a meal.   gabapentin 100 MG capsule Commonly known as:  NEURONTIN Take 1 capsule (100 mg total) by mouth 3 (three) times daily.   methocarbamol 500 MG tablet Commonly known as:  ROBAXIN Take 1 tablet (500 mg total) by mouth every 6 (six) hours as needed for muscle spasms.   mirtazapine 7.5 MG tablet Commonly known as:  REMERON Take 1 tablet (7.5 mg total) by mouth at bedtime.   multivitamin with minerals Tabs tablet Take 1 tablet by mouth daily.   nicotine 7 mg/24hr patch Commonly known as:  NICODERM CQ - dosed in mg/24 hr Place 1 patch (7 mg total) onto the skin daily.   polyethylene glycol packet Commonly known as:  MIRALAX / GLYCOLAX Take 17 g by mouth daily as needed for mild constipation.   silver sulfADIAZINE 1 % cream Commonly known as:  SILVADENE Apply topically daily.            Discharge Care Instructions        Start     Ordered   06/06/17 0000  Increase activity slowly     06/06/17 1109   06/06/17 0000  Call MD for:  temperature >100.4     06/06/17 1109   06/06/17 0000  Call MD for:  persistant nausea and vomiting     06/06/17 1109   06/06/17 0000  Call MD for:  severe uncontrolled pain     06/06/17 1109   06/06/17 0000  Call MD for:  redness, tenderness, or signs of infection (pain, swelling, redness, odor or green/yellow discharge around incision site)     06/06/17 1109   06/06/17 0000  Call MD for:  difficulty breathing, headache or visual disturbances     06/06/17 1109   06/06/17 0000  Call MD for:  hives     06/06/17 1109   06/06/17 0000  Call MD for:  persistant dizziness or light-headedness     06/06/17 1109   06/06/17 0000  Call MD for:  extreme fatigue     06/06/17 1109   06/06/17 0000  AMB referral to wound care center     06/06/17 1109     Follow-up Information    Scot Jun, FNP. Schedule  an appointment as soon as possible for a visit in 1 week(s).   Specialty:  Family Medicine Why:  Hospital Follow Up  Contact information: [place] [address] [phone]        Newt Minion, MD. Go on 06/08/2017.   Specialty:  Orthopedic Surgery Why:  as already scheduled Contact information: [address]  [place] [address] [phone]          Allergies  Allergen Reactions  . Acetaminophen Swelling and Other (See Comments)    Reaction:  Eyelid swelling  . Other Other (See Comments)    Pt states she is allergic to a blood pressure medication, but does not know the reaction or medication   Current Discharge Medication List    CONTINUE these medications which have NOT CHANGED   Details  amLODipine (NORVASC) 10 MG tablet Take 1 tablet (10 mg total) by mouth daily. Qty: 90 tablet, Refills: 2    amoxicillin-clavulanate (AUGMENTIN) 500-125 MG tablet Take 1 tablet by mouth 3 (three) times daily. Refills: 0    buPROPion (WELLBUTRIN XL) 150 MG 24 hr tablet Take 1 tablet (150 mg total) by mouth daily. Qty: 90 tablet, Refills: 2    docusate sodium (COLACE) 100 MG capsule Take 1 capsule (100 mg total) by mouth 2 (two) times daily. Qty: 180 capsule, Refills: 1    doxycycline (VIBRA-TABS) 100 MG tablet Take 1 tablet (100 mg total) by mouth every 12 (twelve) hours. Qty: 28 tablet, Refills: 0    ferrous sulfate 325 (65 FE) MG tablet Take 1 tablet (325 mg total) by mouth 2 (two) times daily with a meal. Qty: 180 tablet, Refills: 0    gabapentin (NEURONTIN) 100 MG capsule Take 1 capsule (100 mg total) by mouth 3 (three) times daily. Qty: 90 capsule, Refills: 3    methocarbamol (ROBAXIN) 500 MG tablet Take 1 tablet (500 mg total) by mouth every 6 (six) hours as needed for muscle spasms. Qty: 90 tablet, Refills: 1    mirtazapine (REMERON) 7.5 MG tablet Take 1 tablet (7.5 mg total) by mouth at bedtime. Qty: 90 tablet, Refills: 2    Multiple  Vitamin (MULTIVITAMIN WITH MINERALS) TABS tablet Take 1 tablet by mouth daily. Qty: 90 tablet, Refills: 0    nicotine (NICODERM CQ - DOSED IN MG/24 HR) 7 mg/24hr patch Place 1 patch (7 mg total) onto the skin daily. Qty: 28 patch, Refills: 2    polyethylene glycol (MIRALAX / GLYCOLAX) packet Take 17 g by mouth daily as needed for mild constipation. Qty: 30 each, Refills: 2    silver sulfADIAZINE (SILVADENE) 1 % cream Apply topically daily. Qty: 50 g, Refills: 0      STOP taking these medications     oxyCODONE (OXY IR/ROXICODONE) 5 MG immediate release tablet         Procedures/Studies: Dg Chest 2 View  Result Date: 06/05/2017 CLINICAL DATA:  Pt complains of mid-sternal chest pain onset at 0630 today. Hx of HTN. Smoker EXAM: CHEST - 2 VIEW COMPARISON:  04/08/2017 FINDINGS: Stable linear scarring or subsegmental atelectasis in the right upper lobe. Lungs are otherwise clear. Heart size and mediastinal contours are within normal limits. No effusion. Visualized bones unremarkable. IMPRESSION: No acute cardiopulmonary disease. Electronically Signed   By: Lucrezia Europe M.D.   On: 06/05/2017 08:57   Ct Angio Chest Pe W And/or Wo Contrast  Result Date: 06/05/2017 CLINICAL DATA:  Acute onset of left-sided chest pain that began at 1 o'clock a.m. this morning. EXAM: CT ANGIOGRAPHY CHEST WITH CONTRAST TECHNIQUE: Multidetector CT imaging of the chest was performed using the standard protocol during bolus administration of intravenous contrast. Multiplanar CT image reconstructions and MIPs were obtained to evaluate the vascular anatomy. CONTRAST:  100 mL Isovue 370 IV. COMPARISON:  CTA chest 10/12/2011. FINDINGS: Cardiovascular: Excellent contrast opacification of the pulmonary arteries. No filling defects within  either main pulmonary artery or their segmental branches in either lung to suggest pulmonary embolism. Heart size upper normal slightly enlarged with left ventricular hypertrophy. Mild right  coronary artery atherosclerosis. Small pericardial effusion. Mild to moderate atherosclerosis involving the thoracic and upper abdominal aorta without evidence of aneurysm. Bovine aortic arch anatomy (left common carotid artery arises from the innominate artery). Mediastinum/Nodes: Upper normal size right hilar node measuring approximately 1.4 cm. No pathologically enlarged mediastinal, hilar or axillary lymph nodes. No mediastinal masses. Normal-appearing esophagus. Thyroid gland unremarkable. Lungs/Pleura: Emphysematous changes throughout both lungs scarring involving the right upper lobe, unchanged. Lungs otherwise clear without confluent airspace consolidation or interstitial disease. No pulmonary parenchymal nodules or masses. No pleural effusions. Central airways patent without significant bronchial wall thickening. Upper Abdomen: Unremarkable for the early arterial phase of enhancement. Musculoskeletal: Mild degenerative disc disease and spondylosis involving the mid thoracic spine. Review of the MIP images confirms the above findings. IMPRESSION: 1. No evidence of pulmonary embolism. 2. COPD/emphysema. Right upper lobe scarring. No acute cardiopulmonary disease. 3. Small pericardial effusion. Borderline cardiomegaly with left ventricular hypertrophy and mild right coronary atherosclerosis. Aortic Atherosclerosis (ICD10-I70.0) and Emphysema (ICD10-J43.9). Electronically Signed   By: Evangeline Dakin M.D.   On: 06/05/2017 13:08      Subjective: Pt without complaints  Discharge Exam: Vitals:   06/06/17 0441 06/06/17 0818  BP: (!) 166/87   Pulse: 99   Resp: 19 (!) 21  Temp: 98.9 F (37.2 C)   SpO2: 98%    Vitals:   06/05/17 1732 06/05/17 2013 06/06/17 0441 06/06/17 0818  BP:  (!) 146/91 (!) 166/87   Pulse: 94 96 99   Resp: (!) 25 20 19  (!) 21  Temp:  99.2 F (37.3 C) 98.9 F (37.2 C)   TempSrc:  Oral Oral   SpO2: 100% 99% 98%   Weight:   36.2 kg (79 lb 12.9 oz)    General: Pt is  alert, awake, not in acute distress Cardiovascular: RRR, S1/S2 +, no rubs, no gallops Respiratory: CTA bilaterally, no wheezing, no rhonchi Abdominal: Soft, NT, ND, bowel sounds + Extremities: stump wound clean and dry   The results of significant diagnostics from this hospitalization (including imaging, microbiology, ancillary and laboratory) are listed below for reference.     Microbiology: Recent Results (from the past 240 hour(s))  Culture, blood (routine x 2)     Status: None (Preliminary result)   Collection Time: 06/05/17  2:48 PM  Result Value Ref Range Status   Specimen Description BLOOD  Final   Special Requests IN PEDIATRIC BOTTLE Blood Culture adequate volume  Final   Culture NO GROWTH < 24 HOURS  Final   Report Status PENDING  Incomplete  Culture, blood (routine x 2)     Status: None (Preliminary result)   Collection Time: 06/05/17  5:04 PM  Result Value Ref Range Status   Specimen Description BLOOD RIGHT ANTECUBITAL  Final   Special Requests   Final    BOTTLES DRAWN AEROBIC AND ANAEROBIC Blood Culture adequate volume   Culture NO GROWTH < 24 HOURS  Final   Report Status PENDING  Incomplete     Labs: BNP (last 3 results) No results for input(s): BNP in the last 8760 hours. Basic Metabolic Panel:  Recent Labs Lab 06/05/17 0850 06/05/17 1704 06/06/17 0200  NA 137  --  137  K 3.2*  --  3.8  CL 102  --  102  CO2 21*  --  24  GLUCOSE  98  --  107*  BUN <5*  --  14  CREATININE 0.58 0.72 0.73  CALCIUM 9.4  --  9.4  MG 1.7  --   --    Liver Function Tests:  Recent Labs Lab 06/05/17 0850  AST 19  ALT 11*  ALKPHOS 74  BILITOT 0.3  PROT 6.8  ALBUMIN 3.2*   No results for input(s): LIPASE, AMYLASE in the last 168 hours. No results for input(s): AMMONIA in the last 168 hours. CBC:  Recent Labs Lab 06/05/17 0850 06/05/17 1704  WBC 7.8 6.7  NEUTROABS 6.1  --   HGB 10.8* 10.6*  HCT 31.5* 31.3*  MCV 80.2 79.6  PLT 397 369   Cardiac  Enzymes:  Recent Labs Lab 06/05/17 1704 06/05/17 2020 06/06/17 0200  TROPONINI <0.03 <0.03 <0.03   BNP: Invalid input(s): POCBNP CBG: No results for input(s): GLUCAP in the last 168 hours. D-Dimer No results for input(s): DDIMER in the last 72 hours. Hgb A1c No results for input(s): HGBA1C in the last 72 hours. Lipid Profile No results for input(s): CHOL, HDL, LDLCALC, TRIG, CHOLHDL, LDLDIRECT in the last 72 hours. Thyroid function studies No results for input(s): TSH, T4TOTAL, T3FREE, THYROIDAB in the last 72 hours.  Invalid input(s): FREET3 Anemia work up No results for input(s): VITAMINB12, FOLATE, FERRITIN, TIBC, IRON, RETICCTPCT in the last 72 hours. Urinalysis    Component Value Date/Time   COLORURINE YELLOW 06/05/2017 0835   APPEARANCEUR CLOUDY (A) 06/05/2017 0835   LABSPEC 1.008 06/05/2017 0835   PHURINE 7.0 06/05/2017 0835   GLUCOSEU NEGATIVE 06/05/2017 0835   HGBUR SMALL (A) 06/05/2017 0835   BILIRUBINUR NEGATIVE 06/05/2017 0835   BILIRUBINUR small 04/29/2015 1533   KETONESUR NEGATIVE 06/05/2017 0835   PROTEINUR NEGATIVE 06/05/2017 0835   UROBILINOGEN 1.0 06/14/2015 2234   NITRITE NEGATIVE 06/05/2017 0835   LEUKOCYTESUR NEGATIVE 06/05/2017 0835   Sepsis Labs Invalid input(s): PROCALCITONIN,  WBC,  LACTICIDVEN Microbiology Recent Results (from the past 240 hour(s))  Culture, blood (routine x 2)     Status: None (Preliminary result)   Collection Time: 06/05/17  2:48 PM  Result Value Ref Range Status   Specimen Description BLOOD  Final   Special Requests IN PEDIATRIC BOTTLE Blood Culture adequate volume  Final   Culture NO GROWTH < 24 HOURS  Final   Report Status PENDING  Incomplete  Culture, blood (routine x 2)     Status: None (Preliminary result)   Collection Time: 06/05/17  5:04 PM  Result Value Ref Range Status   Specimen Description BLOOD RIGHT ANTECUBITAL  Final   Special Requests   Final    BOTTLES DRAWN AEROBIC AND ANAEROBIC Blood Culture  adequate volume   Culture NO GROWTH < 24 HOURS  Final   Report Status PENDING  Incomplete    Time coordinating discharge:   SIGNED:  Irwin Brakeman, MD  [facility] 06/06/2017, 11:11 AM Pager [phone]  If 7PM-7AM, please contact night-coverage [email or web address] Password [email or web address]

## 2017-06-06 NOTE — Clinical Social Work Note (Signed)
CSW consulted, pt has no transportation home. Pt rt/lft BKA's,gets around via wheelchair, no wheelchair present.  EC Brother cannot be reached. Alternate EC Aunt is in [place] reported to [place] someone is already home and pt can call [facility] to verify he's there. Pt contacted [employer] who is at the residence and will meet pt at taxi with wheelchair.  Taxi authorized by CSW.. Pt has no other resources at this time.  Aalyssa Elderkin B. Joline Maxcy Clinical Social Work Dept Weekend Social Worker [phone] 4:37 PM

## 2017-06-06 NOTE — Discharge Instructions (Signed)
Follow with Primary MD  Scot Jun, FNP  and other consultant's as instructed your Hospitalist MD  Please get a complete blood count and chemistry panel checked by your Primary MD at your next visit, and again as instructed by your Primary MD.  Get Medicines reviewed and adjusted: Please take all your medications with you for your next visit with your Primary MD  Laboratory/radiological data: Please request your Primary MD to go over all hospital tests and procedure/radiological results at the follow up, please ask your Primary MD to get all Hospital records sent to his/her office.  In some cases, they will be blood work, cultures and biopsy results pending at the time of your discharge. Please request that your primary care M.D. follows up on these results.  Also Note the following: If you experience worsening of your admission symptoms, develop shortness of breath, life threatening emergency, suicidal or homicidal thoughts you must seek medical attention immediately by calling 911 or calling your MD immediately  if symptoms less severe.  You must read complete instructions/literature along with all the possible adverse reactions/side effects for all the Medicines you take and that have been prescribed to you. Take any new Medicines after you have completely understood and accpet all the possible adverse reactions/side effects.   Do not drive when taking Pain medications or sleeping medications (Benzodaizepines)  Do not take more than prescribed Pain, Sleep and Anxiety Medications. It is not advisable to combine anxiety,sleep and pain medications without talking with your primary care practitioner  Special Instructions: If you have smoked or chewed Tobacco  in the last 2 yrs please stop smoking, stop any regular Alcohol  and or any Recreational drug use.  Wear Seat belts while driving.  Please note: You were cared for by a hospitalist during your hospital stay. Once you are  discharged, your primary care physician will handle any further medical issues. Please note that NO REFILLS for any discharge medications will be authorized once you are discharged, as it is imperative that you return to your primary care physician (or establish a relationship with a primary care physician if you do not have one) for your post hospital discharge needs so that they can reassess your need for medications and monitor your lab values.

## 2017-06-06 NOTE — Progress Notes (Signed)
Discharged to home via wheelchair to front entrance with [employer] with [place]. Tolerated well. Verified with pt that there is an adult at home to receive her once they arrive. Taxi Driver made aware.

## 2017-06-08 ENCOUNTER — Ambulatory Visit (INDEPENDENT_AMBULATORY_CARE_PROVIDER_SITE_OTHER): Payer: Medicaid Other | Admitting: Orthopedic Surgery

## 2017-06-08 ENCOUNTER — Encounter (INDEPENDENT_AMBULATORY_CARE_PROVIDER_SITE_OTHER): Payer: Self-pay

## 2017-06-10 LAB — CULTURE, BLOOD (ROUTINE X 2)
Culture: NO GROWTH
Culture: NO GROWTH
Special Requests: ADEQUATE
Special Requests: ADEQUATE

## 2017-06-14 ENCOUNTER — Encounter (INDEPENDENT_AMBULATORY_CARE_PROVIDER_SITE_OTHER): Payer: Self-pay

## 2017-06-14 ENCOUNTER — Ambulatory Visit (INDEPENDENT_AMBULATORY_CARE_PROVIDER_SITE_OTHER): Payer: Medicaid Other | Admitting: Orthopedic Surgery

## 2017-06-22 ENCOUNTER — Ambulatory Visit (INDEPENDENT_AMBULATORY_CARE_PROVIDER_SITE_OTHER): Payer: Medicaid Other | Admitting: Orthopedic Surgery

## 2017-06-22 ENCOUNTER — Encounter (INDEPENDENT_AMBULATORY_CARE_PROVIDER_SITE_OTHER): Payer: Self-pay | Admitting: Orthopedic Surgery

## 2017-06-22 DIAGNOSIS — G546 Phantom limb syndrome with pain: Secondary | ICD-10-CM

## 2017-06-22 DIAGNOSIS — Z89511 Acquired absence of right leg below knee: Secondary | ICD-10-CM

## 2017-06-22 DIAGNOSIS — Z89512 Acquired absence of left leg below knee: Secondary | ICD-10-CM

## 2017-06-22 NOTE — Progress Notes (Signed)
Post-Op Visit Note   Patient: [name]           Date of Birth: 1961-07-14           MRN: [id number] Visit Date: 06/22/2017 PCP: Scot Jun, FNP  Chief Complaint:  Chief Complaint  Patient presents with  . Right Leg - Follow-up  . Left Leg - Follow-up    HPI:  The patient is a 56 year old woman seen status post bilateral below knee amputations on 04/10/17. Continues to complain of aching, throbbing, pulsing pain. Can be electrical at times is intermittent  Denies ever taking Gabapentin or Lyrica.   States has not been given an order for a prosthetic.    Ortho Exam On examination the right residual limb  is well-healed. The left below the knee amputation is a large area of eschar this was debrided. There is underlying ulceration to the distal tip this is just 6 mm in diameter superficial. Filled in with beefy granulation tissue. There is no drainage no surrounding erythema no maceration no sign of infection. Bilateral below the knee amputations have consolidated well.  Visit Diagnoses:  1. S/P bilateral below knee amputation ([place])   2. Phantom limb pain ([place])     Plan: continue daily cleansing and dressing changes the left. Provided an order to biotech for bilateral prosthetics. Follow-up in office in 2 months  Follow-Up Instructions: Return in about 2 months (around 08/22/2017).   Imaging: No results found.  Orders:  No orders of the defined types were placed in this encounter.  No orders of the defined types were placed in this encounter.    PMFS History: Patient Active Problem List   Diagnosis Date Noted  . Hypertensive crisis   . Phantom limb pain ([place])   . S/P bilateral below knee amputation ([place]) 04/13/2017  . Tobacco abuse   . Post-operative pain   . Acute blood loss anemia   . Atherosclerosis of native arteries of extremities with gangrene, left leg ([place])   . Wound infection 03/30/2017  . AKI (acute kidney injury) ([place]) 02/07/2017  . Acute  kidney injury ([place]) 02/06/2017  . Wound healing, delayed   . Chest pain 04/07/2015  . Protein-calorie malnutrition, severe ([place]) 01/15/2015  . MDD (major depressive disorder), recurrent episode, severe ([place]) 10/16/2014  . Cocaine use disorder, severe, dependence ([place]) 10/10/2014  . 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 Essential hypertension 02/26/2010   Past Medical History:  Diagnosis Date  . CAP (community acquired pneumonia) 03/2014 X 2  . Cocaine abuse    ongoing with resultant vaculitis.  . Depression   . Headache    "weekly" (07/29/2016)  . Hypertension   . Inflammatory arthritis   . Migraines    "probably 5-6/yr" (07/29/2016)  . Normocytic anemia    BL Hgb 9.8-12. Last anemia panel 04/2010 - showing Fe 19, ferritin 101.  Pt on monthly B12 injections  . Rheumatoid arthritis(714.0)    patient reported  . 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 Followed by Dr. Louanne Skye    Family History  Problem Relation Age of Onset  . Breast cancer Mother        Breast cancer  . Alcohol abuse Mother   . Colon cancer Maternal Aunt 53  . Alcohol abuse Father     Past Surgical History:  Procedure Laterality Date  . AMPUTATION Left 05/22/2016   Procedure: AMPUTATION LEFT LONG FINGER;  Surgeon: Marybelle Killings, MD;  Location: [facility];  Service: Orthopedics;  Laterality: Left;  . AMPUTATION Bilateral 04/10/2017   Procedure: AMPUTATION BELOW KNEE;  Surgeon: Newt Minion, MD;  Location: [facility];  Service: Orthopedics;  Laterality: Bilateral;  . HERNIA REPAIR     "stomach"  . I&D EXTREMITY Right 09/26/2015   Procedure: IRRIGATION AND DEBRIDEMENT LEG WOUND  VAC PLACEMENT.;  Surgeon: Loel Lofty Dillingham, DO;  Location: [facility];  Service: Plastics;  Laterality: Right;  . INCISION AND DRAINAGE OF WOUND Bilateral 10/20/2016   Procedure: IRRIGATION AND DEBRIDEMENT WOUND BILATERAL;  Surgeon: Edrick Kins, DPM;  Location: [facility];  Service:  Podiatry;  Laterality: Bilateral;  . IRRIGATION AND DEBRIDEMENT ABSCESS Bilateral 09/26/2013   Procedure: DEBRIDEMENT ULCERS BILATERAL THIGHS;  Surgeon: Gwenyth Ober, MD;  Location: [facility];  Service: General;  Laterality: Bilateral;  . SKIN BIOPSY Bilateral    shin nodules   Social History   Occupational History  . Disability     since 2011, due to her rheumatoid arthritis   Social History Main Topics  . Smoking status: Current Every Day Smoker    Packs/day: 0.12    Years: 38.00    Types: Cigarettes  . Smokeless tobacco: Never Used     Comment: 2 A DAY  . Alcohol use No  . Drug use: Yes    Types: "Crack" cocaine, Cocaine     Comment: Smoked crack 06/02/2017  . Sexual activity: Not Currently

## 2017-06-28 ENCOUNTER — Ambulatory Visit: Payer: Self-pay | Admitting: Family Medicine

## 2017-07-04 ENCOUNTER — Emergency Department (HOSPITAL_COMMUNITY): Payer: Medicaid Other

## 2017-07-04 ENCOUNTER — Encounter (HOSPITAL_COMMUNITY): Payer: Self-pay | Admitting: Emergency Medicine

## 2017-07-04 ENCOUNTER — Emergency Department (HOSPITAL_COMMUNITY)
Admission: EM | Admit: 2017-07-04 | Discharge: 2017-07-04 | Disposition: A | Discharge status: Home / Self Care | Payer: Medicaid Other | Attending: Emergency Medicine | Admitting: Emergency Medicine

## 2017-07-04 DIAGNOSIS — R0789 Other chest pain: Secondary | ICD-10-CM | POA: Diagnosis not present

## 2017-07-04 DIAGNOSIS — Z89511 Acquired absence of right leg below knee: Secondary | ICD-10-CM | POA: Diagnosis not present

## 2017-07-04 DIAGNOSIS — Z89022 Acquired absence of left finger(s): Secondary | ICD-10-CM | POA: Diagnosis not present

## 2017-07-04 DIAGNOSIS — M069 Rheumatoid arthritis, unspecified: Secondary | ICD-10-CM | POA: Diagnosis not present

## 2017-07-04 DIAGNOSIS — F17210 Nicotine dependence, cigarettes, uncomplicated: Secondary | ICD-10-CM | POA: Insufficient documentation

## 2017-07-04 DIAGNOSIS — Z89512 Acquired absence of left leg below knee: Secondary | ICD-10-CM | POA: Diagnosis not present

## 2017-07-04 DIAGNOSIS — F1410 Cocaine abuse, uncomplicated: Secondary | ICD-10-CM

## 2017-07-04 DIAGNOSIS — R21 Rash and other nonspecific skin eruption: Secondary | ICD-10-CM | POA: Insufficient documentation

## 2017-07-04 DIAGNOSIS — I10 Essential (primary) hypertension: Secondary | ICD-10-CM | POA: Insufficient documentation

## 2017-07-04 LAB — BASIC METABOLIC PANEL
Anion gap: 6 (ref 5–15)
BUN: 6 mg/dL (ref 6–20)
CHLORIDE: 105 mmol/L (ref 101–111)
CO2: 24 mmol/L (ref 22–32)
Calcium: 9 mg/dL (ref 8.9–10.3)
Creatinine, Ser: 0.68 mg/dL (ref 0.44–1.00)
GFR calc Af Amer: >60 mL/min (ref >60)
GFR calc non Af Amer: >60 mL/min (ref >60)
Glucose, Bld: 116 mg/dL — ABNORMAL HIGH (ref 65–99)
POTASSIUM: 3.4 mmol/L — AB (ref 3.5–5.1)
SODIUM: 135 mmol/L (ref 135–145)

## 2017-07-04 LAB — URINALYSIS, ROUTINE W REFLEX MICROSCOPIC
BILIRUBIN URINE: NEGATIVE
Glucose, UA: NEGATIVE mg/dL
KETONES UR: NEGATIVE mg/dL
Nitrite: NEGATIVE
PH: 6 (ref 5.0–8.0)
Protein, ur: NEGATIVE mg/dL
Specific Gravity, Urine: 1.012 (ref 1.005–1.030)

## 2017-07-04 LAB — RAPID URINE DRUG SCREEN, HOSP PERFORMED
Amphetamines: NOT DETECTED
BENZODIAZEPINES: NOT DETECTED
Barbiturates: NOT DETECTED
COCAINE: POSITIVE — AB
OPIATES: NOT DETECTED
Tetrahydrocannabinol: POSITIVE — AB

## 2017-07-04 LAB — CBC
HEMATOCRIT: 33 % — AB (ref 36.0–46.0)
HEMOGLOBIN: 11.3 g/dL — AB (ref 12.0–15.0)
MCH: 27.8 pg (ref 26.0–34.0)
MCHC: 34.2 g/dL (ref 30.0–36.0)
MCV: 81.3 fL (ref 78.0–100.0)
Platelets: 246 10*3/uL (ref 150–400)
RBC: 4.06 MIL/uL (ref 3.87–5.11)
RDW: 15.3 % (ref 11.5–15.5)
WBC: 8.1 10*3/uL (ref 4.0–10.5)

## 2017-07-04 LAB — HEPATIC FUNCTION PANEL
ALBUMIN: 3.3 g/dL — AB (ref 3.5–5.0)
ALT: 10 U/L — ABNORMAL LOW (ref 14–54)
AST: 17 U/L (ref 15–41)
Alkaline Phosphatase: 63 U/L (ref 38–126)
BILIRUBIN TOTAL: 0.6 mg/dL (ref 0.3–1.2)
Total Protein: 6.6 g/dL (ref 6.5–8.1)

## 2017-07-04 LAB — LIPASE, BLOOD: LIPASE: 26 U/L (ref 11–51)

## 2017-07-04 LAB — I-STAT TROPONIN, ED
TROPONIN I, POC: 0 ng/mL (ref 0.00–0.08)
Troponin i, poc: 0.01 ng/mL (ref 0.00–0.08)

## 2017-07-04 LAB — D-DIMER, QUANTITATIVE (NOT AT ARMC): D DIMER QUANT: 3.92 ug{FEU}/mL — AB (ref 0.00–0.50)

## 2017-07-04 MED ORDER — NAPROXEN 250 MG PO TABS: 250.0000 mg | ORAL_TABLET | Freq: Two times a day (BID) | ORAL | 0 refills | Status: DC

## 2017-07-04 MED ORDER — KETOROLAC TROMETHAMINE 30 MG/ML IJ SOLN
15.0000 mg | Freq: Once | INTRAMUSCULAR | Status: AC
  Administered 2017-07-04: 15 mg via INTRAVENOUS
  Filled 2017-07-04: qty 1

## 2017-07-04 MED ORDER — IOPAMIDOL (ISOVUE-370) INJECTION 76%
INTRAVENOUS | Status: AC
  Administered 2017-07-04: 100 mL
  Filled 2017-07-04: qty 100

## 2017-07-04 MED ORDER — CYCLOBENZAPRINE HCL 5 MG PO TABS: 5.0000 mg | ORAL_TABLET | Freq: Three times a day (TID) | ORAL | 0 refills | Status: DC | PRN

## 2017-07-04 NOTE — ED Notes (Signed)
Patient transported to X-ray 

## 2017-07-04 NOTE — ED Triage Notes (Signed)
Per EMS pt has had chest pain all day, worsening throughout.  Tonight awoke her from sleep.  10/10 centralized, aching, non radiating.  No nausea or vomiting. Recent hx of bilateral BKA.  Not taking blood pressure medication at this time and was 170/120 on scene.

## 2017-07-04 NOTE — ED Notes (Signed)
Patient transported to CT 

## 2017-07-04 NOTE — Discharge Instructions (Signed)
STOP USING COCAINE!! It can cause chest pain and even a heart attack. Take the medications as prescribed. Use ice or heat for comfort. Follow up with your primary care provider as needed.

## 2017-07-04 NOTE — ED Provider Notes (Signed)
[facility] DEPT Provider Note   CSN: [id number] Arrival date & time: 07/04/17  0119  Time seen 02:04 AM   History   Chief Complaint Chief Complaint  Patient presents with  . Chest Pain    HPI [name] is a 56 y.o. female.  HPI  patient reports she was awakened on September 28 from sleep with pain in her chest just to the left of her sternum. She states the pain has been there constantly and describes it as a pressure. She states deep breathing and movement makes the pain worse. Nothing makes it feel better. She states she has shortness of breath and she started coughing today with white sputum production. She is unsure of fever but states she's been feeling cold and having chills. She's had some green rhinorrhea without sneezing, and some sore throat. She had nausea once earlier today that is gone. She denies vomiting. She also describes epigastric abdominal pain that is aching. She denies diarrhea. She states she's had this chest pain before but does not know what the diagnosis was. She states she was admitted to the hospital. She is unsure if she saw a cardiologist. Patient also complains of a rash for the past couple weeks that she is "flaking off". She has the rash in both of her axilla. She also admits to doing cocaine, however she states it's been a while which means 4-5 days ago. She denies feeling depressed today.   PCP Scot Jun, FNP   Past Medical History:  Diagnosis Date  . CAP (community acquired pneumonia) 03/2014 X 2  . Cocaine abuse    ongoing with resultant vaculitis.  . Depression   . Headache    "weekly" (07/29/2016)  . Hypertension   . Inflammatory arthritis   . Migraines    "probably 5-6/yr" (07/29/2016)  . Normocytic anemia    BL Hgb 9.8-12. Last anemia panel 04/2010 - showing Fe 19, ferritin 101.  Pt on monthly B12 injections  . Rheumatoid arthritis(714.0)    patient reported  . 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 Followed by Dr. Louanne Skye    Patient Active Problem List   Diagnosis Date Noted  . Hypertensive crisis   . Phantom limb pain ([place])   . S/P bilateral below knee amputation ([place]) 04/13/2017  . Tobacco abuse   . Post-operative pain   . Acute blood loss anemia   . 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 Wound infection 03/30/2017  . AKI (acute kidney injury) ([place]) 02/07/2017  . Acute kidney injury ([place]) 02/06/2017  . Wound healing, delayed   . Chest pain 04/07/2015  . Protein-calorie malnutrition, severe ([place]) 01/15/2015  . MDD (major depressive disorder), recurrent episode, severe ([place]) 10/16/2014  . Cocaine use disorder, severe, dependence ([place]) 10/10/2014  . 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 Essential hypertension 02/26/2010    Past Surgical History:  Procedure Laterality Date  . AMPUTATION Left 05/22/2016   Procedure: AMPUTATION LEFT LONG FINGER;  Surgeon: Marybelle Killings, MD;  Location: [facility];  Service: Orthopedics;  Laterality: Left;  . AMPUTATION Bilateral 04/10/2017   Procedure: AMPUTATION BELOW KNEE;  Surgeon: Newt Minion, MD;  Location: [facility];  Service: Orthopedics;  Laterality: Bilateral;  . HERNIA REPAIR     "stomach"  . I&D EXTREMITY Right 09/26/2015   Procedure: IRRIGATION AND DEBRIDEMENT LEG WOUND  VAC PLACEMENT.;  Surgeon: Lyndee Leo  S Dillingham, DO;  Location: [facility];  Service: Plastics;  Laterality: Right;  . INCISION AND DRAINAGE OF WOUND Bilateral 10/20/2016   Procedure: IRRIGATION AND DEBRIDEMENT WOUND BILATERAL;  Surgeon: Edrick Kins, DPM;  Location: [facility];  Service: Podiatry;  Laterality: Bilateral;  . IRRIGATION AND DEBRIDEMENT ABSCESS Bilateral 09/26/2013   Procedure: DEBRIDEMENT ULCERS BILATERAL THIGHS;  Surgeon: Gwenyth Ober, MD;  Location: [facility];  Service: General;  Laterality: Bilateral;  . SKIN BIOPSY Bilateral    shin nodules    OB History    No data available        Home Medications    Prior to Admission medications   Medication Sig Start Date End Date Taking? Authorizing Provider  cyclobenzaprine (FLEXERIL) 5 MG tablet Take 1 tablet (5 mg total) by mouth 3 (three) times daily as needed (muscle soreness). 07/04/17   Rolland Porter, MD  naproxen (NAPROSYN) 250 MG tablet Take 1 tablet (250 mg total) by mouth 2 (two) times daily. 07/04/17   Rolland Porter, MD    Family History Family History  Problem Relation Age of Onset  . Breast cancer Mother        Breast cancer  . Alcohol abuse Mother   . Colon cancer Maternal Aunt 66  . Alcohol abuse Father     Social History Social History  Substance Use Topics  . Smoking status: Current Every Day Smoker    Packs/day: 0.12    Years: 38.00    Types: Cigarettes  . Smokeless tobacco: Never Used     Comment: 2 A DAY  . Alcohol use No  on disability for unknown initial reason   Allergies   Acetaminophen and Other   Review of Systems Review of Systems  All other systems reviewed and are negative.    Physical Exam Updated Vital Signs BP (!) 179/117   Pulse (!) 109   Temp (!) 100.5 F (38.1 C) (Oral)   Resp 20   SpO2 99%   Vital signs normal except hypertension and tachycardia   Physical Exam  Constitutional: She is oriented to person, place, and time.  Non-toxic appearance. She does not appear ill. No distress.  Thin female who is tearful  HENT:  Head: Normocephalic and atraumatic.  Right Ear: External ear normal.  Left Ear: External ear normal.  Nose: Nose normal. No mucosal edema or rhinorrhea.  Mouth/Throat: Oropharynx is clear and moist and mucous membranes are normal. No dental abscesses or uvula swelling.  Eyes: Pupils are equal, round, and reactive to light. Conjunctivae and EOM are normal.  Neck: Normal range of motion and full passive range of motion without pain. Neck supple.  Cardiovascular: Normal rate, regular rhythm and normal heart sounds.  Exam reveals no gallop and  no friction rub.   No murmur heard. Pulmonary/Chest: Effort normal and breath sounds normal. No respiratory distress. She has no wheezes. She has no rhonchi. She has no rales. She exhibits no tenderness and no crepitus.    Abdominal: Soft. Normal appearance and bowel sounds are normal. She exhibits no distension. There is tenderness in the epigastric area. There is no rebound and no guarding.  Musculoskeletal: Normal range of motion. She exhibits no edema or tenderness.  Moves all extremities well. 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  Neurological: She is alert and oriented to person, place, and time. She has  normal strength. No cranial nerve deficit.  Skin: Skin is warm, dry and intact. Rash noted. No erythema. No pallor.  Patient is noted to have a rash in both her axilla with some scabbed areas and hypopigmented skin. She also is having a rash on her elbows with some dried out lesions.  Psychiatric: She has a normal mood and affect. Her speech is normal and behavior is normal. Her mood appears not anxious.  Nursing note and vitals reviewed.          ED Treatments / Results  Labs (all labs ordered are listed, but only abnormal results are displayed) Results for orders placed or performed during the hospital encounter of 26/94/85  Basic metabolic panel  Result Value Ref Range   Sodium 135 135 - 145 mmol/L   Potassium 3.4 (L) 3.5 - 5.1 mmol/L   Chloride 105 101 - 111 mmol/L   CO2 24 22 - 32 mmol/L   Glucose, Bld 116 (H) 65 - 99 mg/dL   BUN 6 6 - 20 mg/dL   Creatinine, Ser 0.68 0.44 - 1.00 mg/dL   Calcium 9.0 8.9 - 10.3 mg/dL   GFR calc non Af Amer >60 >60 mL/min   GFR calc Af Amer >60 >60 mL/min   Anion gap 6 5 - 15  CBC  Result Value Ref Range   WBC 8.1 4.0 - 10.5 K/uL   RBC 4.06 3.87 - 5.11 MIL/uL   Hemoglobin 11.3 (L) 12.0 - 15.0  g/dL   HCT 33.0 (L) 36.0 - 46.0 %   MCV 81.3 78.0 - 100.0 fL   MCH 27.8 26.0 - 34.0 pg   MCHC 34.2 30.0 - 36.0 g/dL   RDW 15.3 11.5 - 15.5 %   Platelets 246 150 - 400 K/uL  D-dimer, quantitative (not at [facility])  Result Value Ref Range   D-Dimer, Quant 3.92 (H) 0.00 - 0.50 ug/mL-FEU  Lipase, blood  Result Value Ref Range   Lipase 26 11 - 51 U/L  Urine rapid drug screen (hosp performed)  Result Value Ref Range   Opiates NONE DETECTED NONE DETECTED   Cocaine POSITIVE (A) NONE DETECTED   Benzodiazepines NONE DETECTED NONE DETECTED   Amphetamines NONE DETECTED NONE DETECTED   Tetrahydrocannabinol POSITIVE (A) NONE DETECTED   Barbiturates NONE DETECTED NONE DETECTED  Urinalysis, Routine w reflex microscopic  Result Value Ref Range   Color, Urine YELLOW YELLOW   APPearance HAZY (A) CLEAR   Specific Gravity, Urine 1.012 1.005 - 1.030   pH 6.0 5.0 - 8.0   Glucose, UA NEGATIVE NEGATIVE mg/dL   Hgb urine dipstick MODERATE (A) NEGATIVE   Bilirubin Urine NEGATIVE NEGATIVE   Ketones, ur NEGATIVE NEGATIVE mg/dL   Protein, ur NEGATIVE NEGATIVE mg/dL   Nitrite NEGATIVE NEGATIVE   Leukocytes, UA MODERATE (A) NEGATIVE   RBC / HPF 6-30 0 - 5 RBC/hpf   WBC, UA 6-30 0 - 5 WBC/hpf   Bacteria, UA MANY (A) NONE SEEN   Squamous Epithelial / LPF 0-5 (A) NONE SEEN   Mucus PRESENT    Amorphous Crystal PRESENT   Hepatic function panel  Result Value Ref Range   Total Protein 6.6 6.5 - 8.1 g/dL   Albumin 3.3 (L) 3.5 - 5.0 g/dL   AST 17 15 - 41 U/L   ALT 10 (L) 14 - 54 U/L   Alkaline Phosphatase 63 38 - 126 U/L   Total Bilirubin 0.6 0.3 - 1.2 mg/dL   Bilirubin, Direct <0.1 (L) 0.1 -  0.5 mg/dL   Indirect Bilirubin NOT CALCULATED 0.3 - 0.9 mg/dL  I-stat troponin, ED  Result Value Ref Range   Troponin i, poc 0.01 0.00 - 0.08 ng/mL   Comment 3          I-stat troponin, ED  Result Value Ref Range   Troponin i, poc 0.00 0.00 - 0.08 ng/mL   Comment 3            Laboratory interpretation all normal  except Chronic renal failure, positive UDS for cocaine and marijuana, very positive d-dimer however in retrospect it is always positive, and a normal delta troponin    EKG  EKG Interpretation  Date/Time:  Sunday July 04 2017 01:29:09 EDT Ventricular Rate:  110 PR Interval:    QRS Duration: 84 QT Interval:  317 QTC Calculation: 429 R Axis:   75 Text Interpretation:  Sinus tachycardia Right atrial enlargement Left ventricular hypertrophy Borderline abnrm T, anterolateral leads No significant change since last tracing 05 Jun 2017 Confirmed by Rolland Porter [phone]) on 07/04/2017 1:32:04 AM       Radiology Dg Chest 2 View  Result Date: 07/04/2017 CLINICAL DATA:  56 y/o  F; chest pain. EXAM: CHEST  2 VIEW COMPARISON:  06/05/2017 CT chest and chest radiograph. FINDINGS: Stable borderline cardiomegaly. Aortic atherosclerosis with calcification. Scarring in right upper lobe. No focal consolidation. No pleural effusion or pneumothorax. No acute osseous abnormality is evident. IMPRESSION: No acute pulmonary process identified. Stable borderline cardiomegaly. Aortic atherosclerosis. Electronically Signed   By: Kristine Garbe M.D.   On: 07/04/2017 03:07   Ct Angio Chest Pe W/cm &/or Wo Cm  Result Date: 07/04/2017 CLINICAL DATA:  Chest pain. EXAM: CT ANGIOGRAPHY CHEST WITH CONTRAST TECHNIQUE: Multidetector CT imaging of the chest was performed using the standard protocol during bolus administration of intravenous contrast. Multiplanar CT image reconstructions and MIPs were obtained to evaluate the vascular anatomy. CONTRAST:  70 cc Isovue 370 IV COMPARISON:  Radiograph earlier this day. Chest CT PE protocol 4 weeks prior FINDINGS: Cardiovascular: There are no filling defects within the pulmonary arteries to suggest pulmonary embolus. Atherosclerotic thoracic aorta without dissection. Common origin of the innominate and left common carotid artery, normal variant anatomy. Mild cardiomegaly.  Small pericardial fluid/thickening appears similar. Mediastinum/Nodes: Prominent right hilar node is unchanged measuring 10 mm short axis. No new or progressive adenopathy. Visualized thyroid gland and esophagus are unremarkable. Lungs/Pleura: Moderate emphysema. Linear right upper lobe scarring. No consolidation, pulmonary edema or pleural fluid. Upper Abdomen: No acute abnormality or change from prior. Musculoskeletal: There are no acute or suspicious osseous abnormalities. Review of the MIP images confirms the above findings. IMPRESSION: 1. No pulmonary embolus or acute intrathoracic abnormality. 2. Mild emphysema with right upper lobe scarring. Aortic Atherosclerosis (ICD10-I70.0) and Emphysema (ICD10-J43.9). Electronically Signed   By: Jeb Levering M.D.   On: 07/04/2017 05:25     Ct Angio Chest Pe W And/or Wo Contrast  Result Date: 06/05/2017 CLINICAL DATA:  Acute onset of left-sided chest pain that began at 1 o'clock a.m. this morning. Marland Kitchen IMPRESSION: 1. No evidence of pulmonary embolism. 2. COPD/emphysema. Right upper lobe scarring. No acute cardiopulmonary disease. 3. Small pericardial effusion. Borderline cardiomegaly with left ventricular hypertrophy and mild right coronary atherosclerosis. Aortic Atherosclerosis (ICD10-I70.0) and Emphysema (ICD10-J43.9). Electronically Signed   By: Evangeline Dakin M.D.   On: 06/05/2017 13:08    Procedures Procedures (including critical care time)  Echocardiogram Sept 1, 2018 ------------------------------------------------------------------- Study Conclusions  - Left ventricle: The cavity  size was normal. Wall thickness was   increased in a pattern of mild LVH. Systolic function was normal.   The estimated ejection fraction was in the range of 55% to 60%.   Wall motion was normal; there were no regional wall motion   abnormalities. Doppler parameters are consistent with abnormal   left ventricular relaxation (grade 1 diastolic  dysfunction).  Impressions:  - Normal LV systolic function; mild diastolic dysfunction; mild   LVH; no significant pericardial effusion.   Medications Ordered in ED Medications  ketorolac (TORADOL) 30 MG/ML injection 15 mg (15 mg Intravenous Given 07/04/17 0329)  iopamidol (ISOVUE-370) 76 % injection (100 mLs  Contrast Given 07/04/17 0437)     Initial Impression / Assessment and Plan / ED Course  I have reviewed the triage vital signs and the nursing notes.  Pertinent labs & imaging results that were available during my care of the patient were reviewed by me and considered in my medical decision making (see chart for details).     Patient was admitted to the hospital September 1 when she presented with similar type chest pain. She had a CTA done which was negative for PE but was suggestive of a pericardial effusion. She had echocardiogram which did not show the effusion. She was positive for cocaine at that time. She was referred back to her PCP.  Patient's d-dimer is elevated, CTA was done. However in retrospect when awoke her d-dimer's are always high. Her delta troponin is negative. I talked to the patient about her test results. We discussed that cocaine can make even a normal heart have a heart attack patient does not seem motivated to quit.  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 This can be followed up by her PCP. Currently I do not feel like she has an acute shingles outbreak. She was not started on steroids or Valtrex.  Final Clinical Impressions(s) / ED Diagnoses   Final diagnoses:  Atypical chest pain  Cocaine abuse (HCC)    New Prescriptions New Prescriptions   CYCLOBENZAPRINE (FLEXERIL) 5 MG TABLET    Take 1 tablet (5 mg total) by mouth 3 (three) times daily as needed (muscle soreness).   NAPROXEN (NAPROSYN) 250 MG TABLET    Take 1 tablet (250 mg total) by mouth 2 (two)  times daily.    Plan discharge  Rolland Porter, MD, Barbette Or, MD 07/04/17 0730

## 2017-07-05 ENCOUNTER — Ambulatory Visit: Payer: Self-pay | Admitting: Family Medicine

## 2017-07-15 ENCOUNTER — Encounter (HOSPITAL_COMMUNITY): Payer: Self-pay | Admitting: Emergency Medicine

## 2017-07-15 ENCOUNTER — Emergency Department (HOSPITAL_COMMUNITY)
Admission: EM | Admit: 2017-07-15 | Discharge: 2017-07-15 | Disposition: A | Discharge status: Home / Self Care | Payer: Medicaid Other | Attending: Emergency Medicine | Admitting: Emergency Medicine

## 2017-07-15 ENCOUNTER — Emergency Department (HOSPITAL_COMMUNITY): Payer: Medicaid Other

## 2017-07-15 DIAGNOSIS — Z79899 Other long term (current) drug therapy: Secondary | ICD-10-CM | POA: Insufficient documentation

## 2017-07-15 DIAGNOSIS — S8012XA Contusion of left lower leg, initial encounter: Secondary | ICD-10-CM | POA: Insufficient documentation

## 2017-07-15 DIAGNOSIS — Y939 Activity, unspecified: Secondary | ICD-10-CM | POA: Diagnosis not present

## 2017-07-15 DIAGNOSIS — Y929 Unspecified place or not applicable: Secondary | ICD-10-CM | POA: Insufficient documentation

## 2017-07-15 DIAGNOSIS — W050XXA Fall from non-moving wheelchair, initial encounter: Secondary | ICD-10-CM | POA: Insufficient documentation

## 2017-07-15 DIAGNOSIS — F17210 Nicotine dependence, cigarettes, uncomplicated: Secondary | ICD-10-CM | POA: Diagnosis not present

## 2017-07-15 DIAGNOSIS — T148XXA Other injury of unspecified body region, initial encounter: Secondary | ICD-10-CM

## 2017-07-15 DIAGNOSIS — Y999 Unspecified external cause status: Secondary | ICD-10-CM | POA: Insufficient documentation

## 2017-07-15 DIAGNOSIS — S8010XA Contusion of unspecified lower leg, initial encounter: Secondary | ICD-10-CM

## 2017-07-15 MED ORDER — TRAMADOL HCL 50 MG PO TABS: 50.0000 mg | ORAL_TABLET | Freq: Four times a day (QID) | ORAL | 0 refills | Status: DC | PRN

## 2017-07-15 MED ORDER — ONDANSETRON HCL 4 MG/2ML IJ SOLN
4.0000 mg | Freq: Once | INTRAMUSCULAR | Status: AC
  Administered 2017-07-15: 4 mg via INTRAVENOUS
  Filled 2017-07-15: qty 2

## 2017-07-15 MED ORDER — DIPHENHYDRAMINE HCL 50 MG/ML IJ SOLN
25.0000 mg | Freq: Once | INTRAMUSCULAR | Status: AC
  Administered 2017-07-15: 25 mg via INTRAVENOUS
  Filled 2017-07-15: qty 1

## 2017-07-15 MED ORDER — HYDROMORPHONE HCL 1 MG/ML IJ SOLN
1.0000 mg | Freq: Once | INTRAMUSCULAR | Status: AC
  Administered 2017-07-15: 1 mg via INTRAVENOUS
  Filled 2017-07-15: qty 1

## 2017-07-15 MED ORDER — SILVER SULFADIAZINE 1 % EX CREA: TOPICAL_CREAM | Freq: Two times a day (BID) | CUTANEOUS | Status: DC

## 2017-07-15 NOTE — ED Provider Notes (Addendum)
[facility] DEPT Provider Note   CSN: [id number] Arrival date & time: 07/15/17  6433     History   Chief Complaint No chief complaint on file.   HPI [name] is a 56 y.o. female.  Patient presents to the emergency department for evaluation after a fall. Patient reports that she was going down a hill in her wheelchair and fell out of the wheelchair, landing on both of her stumps. Patient complaining of severe pain of the bilateral BKA stumps. She did not hit her head or lose consciousness. No neck or back pain.      Past Medical History:  Diagnosis Date  . CAP (community acquired pneumonia) 03/2014 X 2  . Cocaine abuse    ongoing with resultant vaculitis.  . Depression   . Headache    "weekly" (07/29/2016)  . Hypertension   . Inflammatory arthritis   . Migraines    "probably 5-6/yr" (07/29/2016)  . Normocytic anemia    BL Hgb 9.8-12. Last anemia panel 04/2010 - showing Fe 19, ferritin 101.  Pt on monthly B12 injections  . Rheumatoid arthritis(714.0)    patient reported  . 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 Followed by Dr. Louanne Skye    Patient Active Problem List   Diagnosis Date Noted  . Hypertensive crisis   . Phantom limb pain ([place])   . S/P bilateral below knee amputation ([place]) 04/13/2017  . Tobacco abuse   . Post-operative pain   . Acute blood loss anemia   . 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 Wound infection 03/30/2017  . AKI (acute kidney injury) ([place]) 02/07/2017  . Acute kidney injury ([place]) 02/06/2017  . Wound healing, delayed   . Chest pain 04/07/2015  . Protein-calorie malnutrition, severe ([place]) 01/15/2015  . MDD (major depressive disorder), recurrent episode, severe ([place]) 10/16/2014  . Cocaine use disorder, severe, dependence ([place]) 10/10/2014  . 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 Essential hypertension 02/26/2010     Past Surgical History:  Procedure Laterality Date  . AMPUTATION Left 05/22/2016   Procedure: AMPUTATION LEFT LONG FINGER;  Surgeon: Marybelle Killings, MD;  Location: [facility];  Service: Orthopedics;  Laterality: Left;  . AMPUTATION Bilateral 04/10/2017   Procedure: AMPUTATION BELOW KNEE;  Surgeon: Newt Minion, MD;  Location: [facility];  Service: Orthopedics;  Laterality: Bilateral;  . HERNIA REPAIR     "stomach"  . I&D EXTREMITY Right 09/26/2015   Procedure: IRRIGATION AND DEBRIDEMENT LEG WOUND  VAC PLACEMENT.;  Surgeon: Loel Lofty Dillingham, DO;  Location: [facility];  Service: Plastics;  Laterality: Right;  . INCISION AND DRAINAGE OF WOUND Bilateral 10/20/2016   Procedure: IRRIGATION AND DEBRIDEMENT WOUND BILATERAL;  Surgeon: Edrick Kins, DPM;  Location: [facility];  Service: Podiatry;  Laterality: Bilateral;  . IRRIGATION AND DEBRIDEMENT ABSCESS Bilateral 09/26/2013   Procedure: DEBRIDEMENT ULCERS BILATERAL THIGHS;  Surgeon: Gwenyth Ober, MD;  Location: [facility];  Service: General;  Laterality: Bilateral;  . SKIN BIOPSY Bilateral    shin nodules    OB History    No data available       Home Medications    Prior to Admission medications   Medication Sig Start Date End Date Taking? Authorizing Provider  cyclobenzaprine (FLEXERIL) 5 MG tablet Take 1 tablet (5 mg total) by mouth 3 (three) times daily as needed (muscle soreness). 07/04/17   Rolland Porter, MD  naproxen (NAPROSYN) 250 MG tablet Take 1 tablet (250 mg total) by mouth 2 (two) times daily. 07/04/17   Rolland Porter, MD    Family History Family History  Problem Relation Age of Onset  . Breast cancer Mother        Breast cancer  . Alcohol abuse Mother   . Colon cancer Maternal Aunt 93  . Alcohol abuse Father     Social History Social History  Substance Use Topics  . Smoking status: Current Every Day Smoker    Packs/day: 0.12    Years: 38.00    Types: Cigarettes  . Smokeless tobacco: Never Used     Comment: 2 A DAY  . Alcohol use No      Allergies   Acetaminophen and Other   Review of Systems Review of Systems  Skin: Positive for wound.  All other systems reviewed and are negative.    Physical Exam Updated Vital Signs BP (!) 166/81 (BP Location: Right Arm)   Pulse 79   Temp 98.1 F (36.7 C)   Resp 16   SpO2 100%   Physical Exam  Constitutional: She is oriented to person, place, and time. She appears well-developed and well-nourished. No distress.  HENT:  Head: Normocephalic and atraumatic.  Right Ear: Hearing normal.  Left Ear: Hearing normal.  Nose: Nose normal.  Mouth/Throat: Oropharynx is clear and moist and mucous membranes are normal.  Eyes: Pupils are equal, round, and reactive to light. Conjunctivae and EOM are normal.  Neck: Normal range of motion. Neck supple.  Cardiovascular: Regular rhythm, S1 normal and S2 normal.  Exam reveals no gallop and no friction rub.   No murmur heard. Pulmonary/Chest: Effort normal and breath sounds normal. No respiratory distress. She exhibits no tenderness.  Abdominal: Soft. Normal appearance and bowel sounds are normal. There is no hepatosplenomegaly. There is no tenderness. There is no rebound, no guarding, no tenderness at McBurney's point and negative [name]'s sign. No hernia.  Musculoskeletal: Normal range of motion.  Bilateral BKA  Neurological: She is alert and oriented to person, place, and time. She has normal strength. No cranial nerve deficit or sensory deficit. Coordination normal. GCS eye subscore is 4. GCS verbal subscore is 5. GCS motor subscore is 6.  Skin: Skin is warm and dry. No rash noted. No cyanosis.  Abrasion over left patella without any erythema or signs of infection  Bilateral stumps with dried blood at edges but no erythema, warmth, fluctuance.  Psychiatric: She has a normal mood and affect. Her speech is normal and behavior is normal. Thought content normal.  Nursing note and vitals reviewed.          ED Treatments /  Results  Labs (all labs ordered are listed, but only abnormal results are displayed) Labs Reviewed - No data to display  EKG  EKG Interpretation None       Radiology No results found.  Procedures Procedures (including critical care time)  Medications Ordered in ED Medications - No data to display   Initial Impression / Assessment and Plan / ED Course  I have reviewed the triage vital signs and the nursing notes.  Pertinent labs & imaging results that were available during my care of the patient were reviewed by me and considered in my medical decision making (see chart for details).     Patient presents to the ER for evaluation of injury to her bilateral BKA stumps. Patient fell directly onto them out of her wheelchair. X-rays performed. Patient  has a wound over her left patella that she reports has been there for 2 weeks. This was from another fall. There is deep abrasion present but no evidence of infection at this time. Will initiate Silvadene dressings and instructed patient on wound care.  Final Clinical Impressions(s) / ED Diagnoses   Final diagnoses:  Contusion of lower leg, unspecified laterality, initial encounter  Abrasion    New Prescriptions New Prescriptions   No medications on file     Orpah Greek, MD 07/15/17 6606    Orpah Greek, MD 07/15/17 [phone]

## 2017-07-15 NOTE — ED Notes (Signed)
[facility] called for transport.  

## 2017-07-15 NOTE — ED Triage Notes (Signed)
Patient BIB [facility] from home. Patient c/o pain after sliding out of wheelchair and hitting bilateral stumps on floor. Pt had bka amputation about a month ago. Fall occurred around 2030, patient tried to tolerate pain but was unable to and called EMS. EMS gave 100 mcg fentanyl on way to hospital.

## 2017-07-15 NOTE — ED Notes (Signed)
Bed: AY30 Expected date:  Expected time:  Means of arrival:  Comments: EMS 56 yo female from home-BKA-fell on amputee site/administered pain meds

## 2017-07-16 ENCOUNTER — Telehealth: Payer: Self-pay | Admitting: Family Medicine

## 2017-07-16 ENCOUNTER — Ambulatory Visit: Payer: Self-pay | Admitting: Family Medicine

## 2017-07-16 NOTE — Telephone Encounter (Signed)
Completed form requesting personal care assistant  for patient.

## 2017-07-19 ENCOUNTER — Emergency Department (HOSPITAL_COMMUNITY)
Admission: EM | Admit: 2017-07-19 | Discharge: 2017-07-19 | Disposition: A | Discharge status: Home / Self Care | Payer: Medicaid Other | Attending: Emergency Medicine | Admitting: Emergency Medicine

## 2017-07-19 ENCOUNTER — Emergency Department (HOSPITAL_COMMUNITY): Payer: Medicaid Other

## 2017-07-19 ENCOUNTER — Encounter (HOSPITAL_COMMUNITY): Payer: Self-pay | Admitting: Emergency Medicine

## 2017-07-19 DIAGNOSIS — M79606 Pain in leg, unspecified: Secondary | ICD-10-CM | POA: Insufficient documentation

## 2017-07-19 DIAGNOSIS — G894 Chronic pain syndrome: Secondary | ICD-10-CM | POA: Insufficient documentation

## 2017-07-19 DIAGNOSIS — F17210 Nicotine dependence, cigarettes, uncomplicated: Secondary | ICD-10-CM | POA: Diagnosis not present

## 2017-07-19 DIAGNOSIS — I2510 Atherosclerotic heart disease of native coronary artery without angina pectoris: Secondary | ICD-10-CM | POA: Insufficient documentation

## 2017-07-19 DIAGNOSIS — R079 Chest pain, unspecified: Secondary | ICD-10-CM | POA: Diagnosis present

## 2017-07-19 DIAGNOSIS — I10 Essential (primary) hypertension: Secondary | ICD-10-CM | POA: Insufficient documentation

## 2017-07-19 LAB — CBC
HCT: 29.3 % — ABNORMAL LOW (ref 36.0–46.0)
Hemoglobin: 9.8 g/dL — ABNORMAL LOW (ref 12.0–15.0)
MCH: 26.9 pg (ref 26.0–34.0)
MCHC: 33.4 g/dL (ref 30.0–36.0)
MCV: 80.5 fL (ref 78.0–100.0)
PLATELETS: 482 10*3/uL — AB (ref 150–400)
RBC: 3.64 MIL/uL — AB (ref 3.87–5.11)
RDW: 14.2 % (ref 11.5–15.5)
WBC: 4.6 10*3/uL (ref 4.0–10.5)

## 2017-07-19 LAB — I-STAT TROPONIN, ED: TROPONIN I, POC: 0 ng/mL (ref 0.00–0.08)

## 2017-07-19 LAB — BASIC METABOLIC PANEL
Anion gap: 10 (ref 5–15)
BUN: 11 mg/dL (ref 6–20)
CALCIUM: 9.2 mg/dL (ref 8.9–10.3)
CHLORIDE: 103 mmol/L (ref 101–111)
CO2: 23 mmol/L (ref 22–32)
CREATININE: 0.69 mg/dL (ref 0.44–1.00)
GFR calc non Af Amer: >60 mL/min (ref >60)
GLUCOSE: 103 mg/dL — AB (ref 65–99)
Potassium: 3.7 mmol/L (ref 3.5–5.1)
Sodium: 136 mmol/L (ref 135–145)

## 2017-07-19 MED ORDER — ONDANSETRON HCL 4 MG/2ML IJ SOLN
INTRAMUSCULAR | Status: AC
  Filled 2017-07-19: qty 2

## 2017-07-19 MED ORDER — HYDROMORPHONE HCL 1 MG/ML IJ SOLN
2.0000 mg | Freq: Once | INTRAMUSCULAR | Status: AC
  Administered 2017-07-19: 2 mg via INTRAVENOUS
  Filled 2017-07-19: qty 2

## 2017-07-19 MED ORDER — ONDANSETRON HCL 4 MG/2ML IJ SOLN
4.0000 mg | Freq: Once | INTRAMUSCULAR | Status: AC
  Administered 2017-07-19: 4 mg via INTRAVENOUS
  Filled 2017-07-19: qty 2

## 2017-07-19 MED ORDER — HYDRALAZINE HCL 20 MG/ML IJ SOLN
10.0000 mg | Freq: Once | INTRAMUSCULAR | Status: AC
  Administered 2017-07-19: 10 mg via INTRAVENOUS
  Filled 2017-07-19: qty 1

## 2017-07-19 MED ORDER — ONDANSETRON HCL 4 MG/2ML IJ SOLN
4.0000 mg | Freq: Once | INTRAMUSCULAR | Status: AC
  Administered 2017-07-19: 4 mg via INTRAVENOUS

## 2017-07-19 MED ORDER — HYDROMORPHONE HCL 1 MG/ML IJ SOLN
1.0000 mg | Freq: Once | INTRAMUSCULAR | Status: AC
  Administered 2017-07-19: 1 mg via INTRAVENOUS
  Filled 2017-07-19: qty 1

## 2017-07-19 NOTE — ED Notes (Signed)
[facility] notified to transport pt

## 2017-07-19 NOTE — ED Notes (Signed)
Patient transported to X-ray 

## 2017-07-19 NOTE — ED Triage Notes (Signed)
Pt reports phantom pain all night that moves from her legs to her chest.  Pt reports she is out of her blood pressure medication and pain medication and is unable to control the pain.

## 2017-07-19 NOTE — ED Notes (Signed)
Nurse drawing labs. 

## 2017-07-19 NOTE — ED Provider Notes (Signed)
[facility] DEPT Provider Note   CSN: [id number] Arrival date & time: 07/19/17  0442     History   Chief Complaint Chief Complaint  Patient presents with  . Chest Pain  . Leg Pain    HPI [name] is a 56 y.o. female.  Patient presents to the ER for evaluation of severe pain. Patient reports that she is having sharp pains in her legs that radiated all the way up into her chest. Patient is distraught and tearful at arrival.She reports that she has run out of her blood pressure medication and has been unable to see her doctor.      Past Medical History:  Diagnosis Date  . CAP (community acquired pneumonia) 03/2014 X 2  . Cocaine abuse ([place])    ongoing with resultant vaculitis.  . Depression   . Headache    "weekly" (07/29/2016)  . Hypertension   . Inflammatory arthritis   . Migraines    "probably 5-6/yr" (07/29/2016)  . Normocytic anemia    BL Hgb 9.8-12. Last anemia panel 04/2010 - showing Fe 19, ferritin 101.  Pt on monthly B12 injections  . Rheumatoid arthritis(714.0)    patient reported  . 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 Followed by Dr. Louanne Skye    Patient Active Problem List   Diagnosis Date Noted  . Hypertensive crisis   . Phantom limb pain ([place])   . S/P bilateral below knee amputation ([place]) 04/13/2017  . Tobacco abuse   . Post-operative pain   . Acute blood loss anemia   . 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 Wound infection 03/30/2017  . AKI (acute kidney injury) ([place]) 02/07/2017  . Acute kidney injury ([place]) 02/06/2017  . Wound healing, delayed   . Chest pain 04/07/2015  . Protein-calorie malnutrition, severe ([place]) 01/15/2015  . MDD (major depressive disorder), recurrent episode, severe ([place]) 10/16/2014  . Cocaine use disorder, severe, dependence ([place]) 10/10/2014  . 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 Essential  hypertension 02/26/2010    Past Surgical History:  Procedure Laterality Date  . AMPUTATION Left 05/22/2016   Procedure: AMPUTATION LEFT LONG FINGER;  Surgeon: Marybelle Killings, MD;  Location: [facility];  Service: Orthopedics;  Laterality: Left;  . AMPUTATION Bilateral 04/10/2017   Procedure: AMPUTATION BELOW KNEE;  Surgeon: Newt Minion, MD;  Location: [facility];  Service: Orthopedics;  Laterality: Bilateral;  . HERNIA REPAIR     "stomach"  . I&D EXTREMITY Right 09/26/2015   Procedure: IRRIGATION AND DEBRIDEMENT LEG WOUND  VAC PLACEMENT.;  Surgeon: Loel Lofty Dillingham, DO;  Location: [facility];  Service: Plastics;  Laterality: Right;  . INCISION AND DRAINAGE OF WOUND Bilateral 10/20/2016   Procedure: IRRIGATION AND DEBRIDEMENT WOUND BILATERAL;  Surgeon: Edrick Kins, DPM;  Location: [facility];  Service: Podiatry;  Laterality: Bilateral;  . IRRIGATION AND DEBRIDEMENT ABSCESS Bilateral 09/26/2013   Procedure: DEBRIDEMENT ULCERS BILATERAL THIGHS;  Surgeon: Gwenyth Ober, MD;  Location: [facility];  Service: General;  Laterality: Bilateral;  . SKIN BIOPSY Bilateral    shin nodules    OB History    No data available       Home Medications    Prior to Admission medications   Medication Sig Start Date End Date Taking? Authorizing Provider  cyclobenzaprine (FLEXERIL) 5 MG tablet Take 1 tablet (5 mg total) by mouth 3 (three) times daily as needed (  muscle soreness). Patient not taking: Reported on 07/15/2017 07/04/17   Rolland Porter, MD  naproxen (NAPROSYN) 250 MG tablet Take 1 tablet (250 mg total) by mouth 2 (two) times daily. Patient not taking: Reported on 07/15/2017 07/04/17   Rolland Porter, MD  traMADol (ULTRAM) 50 MG tablet Take 1 tablet (50 mg total) by mouth every 6 (six) hours as needed. 07/15/17   Orpah Greek, MD    Family History Family History  Problem Relation Age of Onset  . Breast cancer Mother        Breast cancer  . Alcohol abuse Mother   . Colon cancer Maternal Aunt 66  . Alcohol abuse  Father     Social History Social History  Substance Use Topics  . Smoking status: Current Every Day Smoker    Packs/day: 0.12    Years: 38.00    Types: Cigarettes  . Smokeless tobacco: Never Used     Comment: 2 A DAY  . Alcohol use No     Allergies   Acetaminophen and Other   Review of Systems Review of Systems  Cardiovascular: Positive for chest pain.  Musculoskeletal: Positive for arthralgias and back pain.  All other systems reviewed and are negative.    Physical Exam Updated Vital Signs BP 121/76   Pulse 96   Temp 98.8 F (37.1 C) (Oral)   Resp 18   SpO2 98%   Physical Exam  Constitutional: She is oriented to person, place, and time. She appears well-developed and well-nourished. She appears distressed.  HENT:  Head: Normocephalic and atraumatic.  Right Ear: Hearing normal.  Left Ear: Hearing normal.  Nose: Nose normal.  Mouth/Throat: Oropharynx is clear and moist and mucous membranes are normal.  Eyes: Pupils are equal, round, and reactive to light. Conjunctivae and EOM are normal.  Neck: Normal range of motion. Neck supple.  Cardiovascular: Regular rhythm, S1 normal and S2 normal.  Exam reveals no gallop and no friction rub.   No murmur heard. Pulmonary/Chest: Effort normal and breath sounds normal. No respiratory distress. She exhibits no tenderness.  Abdominal: Soft. Normal appearance and bowel sounds are normal. There is no hepatosplenomegaly. There is no tenderness. There is no rebound, no guarding, no tenderness at McBurney's point and negative [name]'s sign. No hernia.  Musculoskeletal: Normal range of motion.  Neurological: She is alert and oriented to person, place, and time. She has normal strength. No cranial nerve deficit or sensory deficit. Coordination normal. GCS eye subscore is 4. GCS verbal subscore is 5. GCS motor subscore is 6.  Skin: Skin is warm, dry and intact. No rash noted. No cyanosis.  Psychiatric: She has a normal mood and affect.  Her speech is normal and behavior is normal. Thought content normal.  Nursing note and vitals reviewed.    ED Treatments / Results  Labs (all labs ordered are listed, but only abnormal results are displayed) Labs Reviewed  BASIC METABOLIC PANEL - Abnormal; Notable for the following:       Result Value   Glucose, Bld 103 (*)    All other components within normal limits  CBC - Abnormal; Notable for the following:    RBC 3.64 (*)    Hemoglobin 9.8 (*)    HCT 29.3 (*)    Platelets 482 (*)    All other components within normal limits  I-STAT TROPONIN, ED    EKG  EKG Interpretation  Date/Time:  Monday July 19 2017 04:49:13 EDT Ventricular Rate:  97 PR Interval:  QRS Duration: 84 QT Interval:  392 QTC Calculation: 498 R Axis:   83 Text Interpretation:  Sinus rhythm Biatrial enlargement Left ventricular hypertrophy Borderline prolonged QT interval No significant change since last tracing Confirmed by Orpah Greek [phone]) on 07/19/2017 4:56:41 AM Also confirmed by Orpah Greek [phone]), editor Drema Pry [phone])  on 07/19/2017 7:10:45 AM       Radiology Dg Chest 2 View  Result Date: 07/19/2017 CLINICAL DATA:  Initial evaluation for acute mid chest pain. EXAM: CHEST  2 VIEW COMPARISON:  Recent CT from 07/04/2017. FINDINGS: Mild cardiomegaly. Mediastinal silhouette normal. Aortic atherosclerosis. Lungs mildly hyperinflated with emphysematous changes. Nodular densities overlying the mid lungs bilaterally consistent with nipple shadows. No focal infiltrates. No pulmonary edema or pleural effusion. No pneumothorax. No acute osseus abnormality. IMPRESSION: 1. No active cardiopulmonary disease. 2. Emphysema. 3. Cardiomegaly with aortic atherosclerosis. Electronically Signed   By: Jeannine Boga M.D.   On: 07/19/2017 06:01    Procedures Procedures (including critical care time)  Medications Ordered in ED Medications  HYDROmorphone (DILAUDID)  injection 1 mg (1 mg Intravenous Given 07/19/17 0518)  ondansetron (ZOFRAN) injection 4 mg (4 mg Intravenous Given 07/19/17 0518)  hydrALAZINE (APRESOLINE) injection 10 mg (10 mg Intravenous Given 07/19/17 0518)  HYDROmorphone (DILAUDID) injection 2 mg (2 mg Intravenous Given 07/19/17 0656)  ondansetron (ZOFRAN) injection 4 mg (4 mg Intravenous Given 07/19/17 0623)     Initial Impression / Assessment and Plan / ED Course  I have reviewed the triage vital signs and the nursing notes.  Pertinent labs & imaging results that were available during my care of the patient were reviewed by me and considered in my medical decision making (see chart for details).     Patient presents to the ER with complaints of pain. Patient has been experiencing pain all over. She reports that the pain starts in her legs and goes all the way up into her chest. Patient is tearful at arrival. Patient has a history of similar presentations. The last time she came in [facility] presentation she had workup including CT angiography, multiple troponins, symptoms were felt to be secondary to her cocaine abuse and chronic pain. Her workup today including blood work, troponin, EKG. No acute abnormality noted. Patient's blood pressure was elevated at arrival, significantly improved, now normal after analgesia. Patient will be discharged, follow-up with her primary care physician.  Final Clinical Impressions(s) / ED Diagnoses   Final diagnoses:  Chronic pain syndrome    New Prescriptions New Prescriptions   No medications on file     Orpah Greek, MD 07/19/17 [phone]

## 2017-07-23 ENCOUNTER — Encounter (HOSPITAL_COMMUNITY): Payer: Self-pay | Admitting: Emergency Medicine

## 2017-07-23 ENCOUNTER — Emergency Department (HOSPITAL_COMMUNITY)
Admission: EM | Admit: 2017-07-23 | Discharge: 2017-07-23 | Disposition: A | Discharge status: Home / Self Care | Payer: Medicaid Other | Attending: Emergency Medicine | Admitting: Emergency Medicine

## 2017-07-23 DIAGNOSIS — T8789 Other complications of amputation stump: Secondary | ICD-10-CM

## 2017-07-23 DIAGNOSIS — Z89511 Acquired absence of right leg below knee: Secondary | ICD-10-CM | POA: Insufficient documentation

## 2017-07-23 DIAGNOSIS — Z89512 Acquired absence of left leg below knee: Secondary | ICD-10-CM | POA: Insufficient documentation

## 2017-07-23 DIAGNOSIS — G546 Phantom limb syndrome with pain: Secondary | ICD-10-CM | POA: Insufficient documentation

## 2017-07-23 DIAGNOSIS — F1410 Cocaine abuse, uncomplicated: Secondary | ICD-10-CM | POA: Insufficient documentation

## 2017-07-23 DIAGNOSIS — F17210 Nicotine dependence, cigarettes, uncomplicated: Secondary | ICD-10-CM | POA: Insufficient documentation

## 2017-07-23 DIAGNOSIS — I10 Essential (primary) hypertension: Secondary | ICD-10-CM | POA: Diagnosis not present

## 2017-07-23 DIAGNOSIS — M79609 Pain in unspecified limb: Secondary | ICD-10-CM

## 2017-07-23 MED ORDER — IBUPROFEN 800 MG PO TABS
800.0000 mg | ORAL_TABLET | Freq: Once | ORAL | Status: AC
  Administered 2017-07-23: 800 mg via ORAL
  Filled 2017-07-23: qty 1

## 2017-07-23 NOTE — ED Provider Notes (Signed)
[facility] DEPT Provider Note   CSN: [id number] Arrival date & time: 07/23/17  0357     History   Chief Complaint Chief Complaint  Patient presents with  . leg/stump pain    HPI [name] is a 56 y.o. female.  HPI   56 year old female with hx of cocaine abusewith resultant vasculitis, depression, hypertension, phantom limb painpresenting complaining of stump pain.  Patient reports she fell off a wheelchair several days ago and injuring his thumbs. She is now having pain to both stumps more significant on the left side. She described pain as "it just hurts" and rates pain as severe, persistent, with pain throughout her body. She denies any specific treatment tried. She is scheduled to follow-up with her primary care provider today for further management of her condition. She is here requesting for pain management. No report of fever or numbness. No other injury. She reports last cocaine use was 5 days ago and admits that she is trying her best to quit. She has been seen in the ER multiple times for similar complaints. X-rays of her tib-fib region bilaterally was performed on 07/15/17 which were negative and no evidence of acute fx/dislocation or osteomyelitis.       Past Medical History:  Diagnosis Date  . CAP (community acquired pneumonia) 03/2014 X 2  . Cocaine abuse ([place])    ongoing with resultant vaculitis.  . Depression   . Headache    "weekly" (07/29/2016)  . Hypertension   . Inflammatory arthritis   . Migraines    "probably 5-6/yr" (07/29/2016)  . Normocytic anemia    BL Hgb 9.8-12. Last anemia panel 04/2010 - showing Fe 19, ferritin 101.  Pt on monthly B12 injections  . Rheumatoid arthritis(714.0)    patient reported  . 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 Followed by Dr. Louanne Skye    Patient Active Problem List   Diagnosis Date Noted  . Hypertensive crisis   . Phantom limb  pain ([place])   . S/P bilateral below knee amputation ([place]) 04/13/2017  . Tobacco abuse   . Post-operative pain   . Acute blood loss anemia   . 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 Wound infection 03/30/2017  . AKI (acute kidney injury) ([place]) 02/07/2017  . Acute kidney injury ([place]) 02/06/2017  . Wound healing, delayed   . Chest pain 04/07/2015  . Protein-calorie malnutrition, severe ([place]) 01/15/2015  . MDD (major depressive disorder), recurrent episode, severe ([place]) 10/16/2014  . Cocaine use disorder, severe, dependence ([place]) 10/10/2014  . 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 Essential hypertension 02/26/2010    Past Surgical History:  Procedure Laterality Date  . AMPUTATION Left 05/22/2016   Procedure: AMPUTATION LEFT LONG FINGER;  Surgeon: Marybelle Killings, MD;  Location: [facility];  Service: Orthopedics;  Laterality: Left;  . AMPUTATION Bilateral 04/10/2017   Procedure: AMPUTATION BELOW KNEE;  Surgeon: Newt Minion, MD;  Location: [facility];  Service: Orthopedics;  Laterality: Bilateral;  . HERNIA REPAIR     "stomach"  . I&D EXTREMITY Right 09/26/2015   Procedure: IRRIGATION AND DEBRIDEMENT LEG WOUND  VAC PLACEMENT.;  Surgeon: Loel Lofty Dillingham, DO;  Location: [facility];  Service: Plastics;  Laterality: Right;  . INCISION AND DRAINAGE OF WOUND Bilateral 10/20/2016   Procedure: IRRIGATION AND DEBRIDEMENT WOUND BILATERAL;  Surgeon: Edrick Kins, DPM;  [facility];  Service: Podiatry;  Laterality: Bilateral;  . IRRIGATION AND DEBRIDEMENT ABSCESS Bilateral 09/26/2013   Procedure: DEBRIDEMENT ULCERS BILATERAL THIGHS;  Surgeon: Gwenyth Ober, MD;  Location: [facility];  Service: General;  Laterality: Bilateral;  . SKIN BIOPSY Bilateral    shin nodules    OB History    No data available       Home Medications    Prior to Admission medications   Medication Sig Start Date End Date Taking? Authorizing Provider  cyclobenzaprine  (FLEXERIL) 5 MG tablet Take 1 tablet (5 mg total) by mouth 3 (three) times daily as needed (muscle soreness). Patient not taking: Reported on 07/15/2017 07/04/17   Rolland Porter, MD  naproxen (NAPROSYN) 250 MG tablet Take 1 tablet (250 mg total) by mouth 2 (two) times daily. Patient not taking: Reported on 07/15/2017 07/04/17   Rolland Porter, MD  traMADol (ULTRAM) 50 MG tablet Take 1 tablet (50 mg total) by mouth every 6 (six) hours as needed. 07/15/17   Orpah Greek, MD    Family History Family History  Problem Relation Age of Onset  . Breast cancer Mother        Breast cancer  . Alcohol abuse Mother   . Colon cancer Maternal Aunt 35  . Alcohol abuse Father     Social History Social History  Substance Use Topics  . Smoking status: Current Every Day Smoker    Packs/day: 0.12    Years: 38.00    Types: Cigarettes  . Smokeless tobacco: Never Used     Comment: 2 A DAY  . Alcohol use No     Allergies   Acetaminophen and Other   Review of Systems Review of Systems  All other systems reviewed and are negative.    Physical Exam Updated Vital Signs BP (!) 180/98 (BP Location: Right Arm)   Pulse 100   Temp 98 F (36.7 C) (Oral)   Resp (!) 24   SpO2 100%   Physical Exam  Constitutional:  Chronically ill-appearing female, writhing in bed, crying  Neck: Neck supple.  Cardiovascular:  Mild tachycardia without murmurs rubs gallops  Pulmonary/Chest: Effort normal and breath sounds normal.  Musculoskeletal: She exhibits tenderness (tenderness to left stump on palpation without crepitus or skin breakage aside from previously healing ulcer. L knee nontender.).  RLE: dressing in place at the stump, a shallow ulceration mild discharge noted, no skin erythema.    Skin:  Multiple healing vasculitis skin changes noted to ears, face, and body.   Nursing note and vitals reviewed.    ED Treatments / Results  Labs (all labs ordered are listed, but only abnormal results are  displayed) Labs Reviewed - No data to display  EKG  EKG Interpretation None       Radiology No results found.  Procedures Procedures (including critical care time)  Medications Ordered in ED Medications - No data to display   Initial Impression / Assessment and Plan / ED Course  I have reviewed the triage vital signs and the nursing notes.  Pertinent labs & imaging results that were available during my care of the patient were reviewed by me and considered in my medical decision making (see chart for details).     BP (!) 196/95 (BP Location: Left Arm) Comment: pt will not sit still   Pulse 99   Temp 98.2 F (36.8 C) (Oral)   Resp (!) 22   SpO2 100%  The patient was noted to be hypertensive today in  the emergency department. I have spoken with the patient regarding hypertension and the need for improved management. I instructed the patient to followup with the Primary care doctor within 4 days to improve the management of the patient's hypertension. I also counseled the patient regarding the signs and symptoms which would require an emergent visit to an emergency department for hypertensive urgency and/or hypertensive emergency. The patient understood the need for improved hypertensive management.   Final Clinical Impressions(s) / ED Diagnoses   Final diagnoses:  Stump pain ([place])    New Prescriptions New Prescriptions   No medications on file   6:29 AM Pt here with c/o pain to her stumps.  She did report falling over a week ago and report more pain recently.  She has had negative imaging of both tib/tib on 07/15/17.  I believe her pain is related to phantom limp pain as oppose to osteomyelitis or new fx/dislocation. She does have a shallow ulceration to her R stump with dressing in place.  She will benefit from antibiotic and close monitoring. She is schedule to be seen by her PCP today.  Therefore, I will defer further management to her PCP. Will provide pain control in  the ER.  Care discussed with Dr. Florina Ou.   7:04 AM After receiving ibuprofen, pt requested to be discharge.  She will follow up with her PCP today for further management of her stump pain.  Her BP is elevated likely 2/2 to pain.  She will have it recheck by her PCP.    Domenic Moras, PA-C 07/23/17 2924    Shanon Rosser, MD 07/23/17 [phone]

## 2017-07-23 NOTE — Discharge Instructions (Signed)
Please follow with your doctor today for further management of your stump pain.

## 2017-07-23 NOTE — ED Notes (Signed)
[facility] has been notified regarding transport.

## 2017-07-23 NOTE — ED Notes (Signed)
Patient resting comfortably, no acute distress noted at this time. Will continue to monitor. Awaiting [facility] transport.

## 2017-07-23 NOTE — ED Triage Notes (Signed)
Patient BIB [facility] from home for bilateral stump pain. patient recently fell out of wheelchair onto stumps and pain became worse this evening. Patient very tearful, pain 10/10.  Pt states no cocaine use "in about 5 days".

## 2017-07-23 NOTE — ED Notes (Signed)
Bed: GO11 Expected date:  Expected time:  Means of arrival:  Comments: EMS 56 yo female left leg pain-amputated 3 months ago-fall 3 days ago-seen for same-now has severe pain in stump

## 2017-07-26 ENCOUNTER — Encounter: Payer: Self-pay | Admitting: Family Medicine

## 2017-07-26 ENCOUNTER — Ambulatory Visit (INDEPENDENT_AMBULATORY_CARE_PROVIDER_SITE_OTHER): Payer: Medicaid Other | Admitting: Family Medicine

## 2017-07-26 VITALS — BP 118/80 | HR 88 | Temp 98.7°F | Ht 61.0 in

## 2017-07-26 DIAGNOSIS — F1410 Cocaine abuse, uncomplicated: Secondary | ICD-10-CM | POA: Diagnosis not present

## 2017-07-26 DIAGNOSIS — N3001 Acute cystitis with hematuria: Secondary | ICD-10-CM | POA: Diagnosis not present

## 2017-07-26 DIAGNOSIS — G546 Phantom limb syndrome with pain: Secondary | ICD-10-CM

## 2017-07-26 DIAGNOSIS — F418 Other specified anxiety disorders: Secondary | ICD-10-CM

## 2017-07-26 LAB — POCT URINALYSIS DIP (DEVICE)
BILIRUBIN URINE: NEGATIVE
GLUCOSE, UA: NEGATIVE mg/dL
Ketones, ur: NEGATIVE mg/dL
NITRITE: POSITIVE — AB
Protein, ur: NEGATIVE mg/dL
Specific Gravity, Urine: 1.025 (ref 1.005–1.030)
UROBILINOGEN UA: 1 mg/dL (ref 0.0–1.0)
pH: 6 (ref 5.0–8.0)

## 2017-07-26 MED ORDER — BUSPIRONE HCL 15 MG PO TABS: 15.0000 mg | ORAL_TABLET | Freq: Three times a day (TID) | ORAL | 0 refills | Status: DC

## 2017-07-26 MED ORDER — GABAPENTIN 600 MG PO TABS: 600.0000 mg | ORAL_TABLET | Freq: Three times a day (TID) | ORAL | 3 refills | Status: DC

## 2017-07-26 MED ORDER — TRAMADOL HCL 50 MG PO TABS: 100.0000 mg | ORAL_TABLET | Freq: Two times a day (BID) | ORAL | 0 refills | Status: DC | PRN

## 2017-07-26 MED ORDER — ESCITALOPRAM OXALATE 10 MG PO TABS: 20.0000 mg | ORAL_TABLET | Freq: Every day | ORAL | 0 refills | Status: DC

## 2017-07-26 MED ORDER — ESCITALOPRAM OXALATE 10 MG PO TABS: 20.0000 mg | ORAL_TABLET | Freq: Every day | ORAL | 4 refills | Status: DC

## 2017-07-26 NOTE — Patient Instructions (Addendum)
Reach out to the wound care center to schedule a wound care appointment-Phone: [phone]  I have referred you to [facility]. The case manager will contact you to schedule your first appointment and intake.   Urinary tract infection, take Keflex 500 mg twice daily for 10 day.   Buspar for anxiety 3 times day 15 mg and Lexapro 20 mg everyday at bedtime.

## 2017-07-26 NOTE — Progress Notes (Signed)
Patient ID: CARIANNE [name], female    DOB: Jun 02, 1961, 56 y.o.   MRN: [id number]  PCP: Scot Jun, FNP  Chief Complaint  Patient presents with  . Follow-up    Subjective:  HPI [name] is a 56 y.o. female presents for routine follow-up. [name] has "no showed" several follow-up appointments since her last appointment in 04/27/2017. Medical history significant for cocaine abuse, bilateral BK amputation, hypertension, chronic tobacco use, major depressive disorder, and phantom leg pain.  [name] reports today that she last used cocaine more than a week ago.  She continues to experience chronic depression and has had thoughts of suicide although currently denies suicidal or homicidal thoughts.  She reports frustration as she is wanting to go into some type of a rehab program to help with her drug addiction but was turned down by a local rehab facility .  During her last office visit she was placed on escitalopram 20 mg to manage symptoms of depression however she admits to inconsistently taking medication and never returned to office for follow-up.  [name] also continues to complain of phantom leg pain.  She recently experienced a fall from her wheelchair in which she felt both of her stumps and sustained abrasions from the fall.  She reports that she has upcoming appointment with wound care for wound management. For pain , she has been prescribed tramadol, and reports pain has not improved. She obtained pain relief from gabapentin, although has been out of medication for several weeks. Social History   Social History  . Marital status: Single    Spouse name: N/A  . Number of children: 0  . Years of education: 11th grade   Occupational History  . Disability     since 2011, due to her rheumatoid arthritis   Social History Main Topics  . Smoking status: Current Every Day Smoker    Packs/day: 0.12    Years: 38.00    Types: Cigarettes  . Smokeless tobacco: Never Used     Comment: 2 A DAY   . Alcohol use No  . Drug use: Yes    Types: "Crack" cocaine, Cocaine     Comment: Smoked crack 06/02/2017  . Sexual activity: Not Currently   Other Topics Concern  . Not on file   Social History Narrative   Unemployed:  cleaning in past   Living at [place]; has Medicaid.   crack/cocaine use; pt denies IVDU   tobacco:  1/2 ppd, trying to quit   alcohol:  none        Family History  Problem Relation Age of Onset  . Breast cancer Mother        Breast cancer  . Alcohol abuse Mother   . Colon cancer Maternal Aunt 33  . Alcohol abuse Father    Review of Systems  Constitutional: Negative for fever, chills, diaphoresis, activity change, appetite change and fatigue. Respiratory: Negative for cough, choking, chest tightness, shortness of breath, wheezing and stridor.  Cardiovascular: Negative for chest pain, palpitations and leg swelling. Genitourinary: Positive for dysuria, negative urgency, negative frequency, hematuria, flank pain, Musculoskeletal: Positive bilateral stump pain  Neurological: Positive Phantom leg Pain  Psychiatric/Behavioral: Positive for chronic substance abuse, Positive anxiety and depressed mood, Postive for episodic crying episodes  Patient Active Problem List   Diagnosis Date Noted  . Hypertensive crisis   . Phantom limb pain ([place])   . S/P bilateral below knee amputation ([place]) 04/13/2017  . Tobacco abuse   .  Post-operative pain   . Acute blood loss anemia   . Atherosclerosis of native arteries of extremities with gangrene, left leg ([place])   . Wound infection 03/30/2017  . AKI (acute kidney injury) ([place]) 02/07/2017  . Acute kidney injury ([place]) 02/06/2017  . Wound healing, delayed   . Chest pain 04/07/2015  . Protein-calorie malnutrition, severe ([place]) 01/15/2015  . MDD (major depressive disorder), recurrent episode, severe ([place]) 10/16/2014  . Cocaine use disorder, severe, dependence ([place]) 10/10/2014  . 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 Essential hypertension 02/26/2010    Allergies  Allergen Reactions  . Acetaminophen Swelling and Other (See Comments)    Reaction:  Eyelid swelling  . Other Other (See Comments)    Pt states she is allergic to a blood pressure medication, but does not know the reaction or medication    Prior to Admission medications   Medication Sig Start Date End Date Taking? Authorizing Provider  cyclobenzaprine (FLEXERIL) 5 MG tablet Take 1 tablet (5 mg total) by mouth 3 (three) times daily as needed (muscle soreness). 07/04/17  Yes Rolland Porter, MD  naproxen (NAPROSYN) 250 MG tablet Take 1 tablet (250 mg total) by mouth 2 (two) times daily. 07/04/17  Yes Rolland Porter, MD  traMADol (ULTRAM) 50 MG tablet Take 1 tablet (50 mg total) by mouth every 6 (six) hours as needed. Patient not taking: Reported on 07/26/2017 07/15/17   Orpah Greek, MD    Past Medical, Surgical Family and Social History reviewed and updated.    Objective:   Today's Vitals   07/26/17 1453  BP: 118/80  Pulse: 88  Temp: 98.7 F (37.1 C)  TempSrc: Oral  SpO2: 100%  Height: 5' 1"  (1.549 m)    Wt Readings from Last 3 Encounters:  06/06/17 79 lb 12.9 oz (36.2 kg)  04/15/17 85 lb 5.1 oz (38.7 kg)  04/13/17 105 lb 1.6 oz (47.7 kg)    Physical Exam Constitutional: Patient appears cachectic. No distress. HENT: Normocephalic, atraumatic, External right and left ear normal. Oropharynx is clear and moist.  Neck: Normal ROM. Neck supple.  CVS: RRR, S1/S2 +, no murmurs, no gallops, no carotid bruit.  Pulmonary: Effort and breath sounds normal.  Abdominal: Soft. BS +, no distension, tenderness, rebound or guarding.  Musculoskeletal: No edema and stump tenderness.  Neuro: Alert. Normal reflexes, muscle tone coordination.  Skin: Skin is warm and dry. No rash noted. Not diaphoretic. No erythema. No pallor. Psychiatric: Normal mood and affect. Behavior, judgment, thought content normal. Assessment & Plan:  1.  Acute cystitis with hematuria, treating empirically with Keflex 500 mg twice daily for 10 days. Urine culture pending. 2. Depression with anxiety, will resume Lexapro 20 mg daily at bedtime.  Adding BuSpar 15 mg 3 times per day.  [facility] as well as [facility]. 3. Cocaine abuse (HCC)Patient is being referred to [facility] program.  Encouraged compliance with prescribed medications to manage depression and anxiety in efforts to reduce cravings for illicit drugs. 4. Phantom Limb Pain, resume Gabapentin and will trial increasing Tramadol in efforts of improving pain.    Meds ordered this encounter  Medications  . gabapentin (NEURONTIN) 600 MG tablet    Sig: Take 1 tablet (600 mg total) by mouth 3 (three) times daily.    Dispense:  60 tablet    Refill:  3    Order Specific Question:   Supervising Provider    Answer:  JEGEDE, OLUGBEMIGA E [id number]  . traMADol (ULTRAM) 50 MG tablet    Sig: Take 2 tablets (100 mg total) by mouth every 12 (twelve) hours as needed for severe pain.    Dispense:  15 tablet    Refill:  0    Order Specific Question:   Supervising Provider    Answer:   Tresa Garter [id number]  . busPIRone (BUSPAR) 15 MG tablet    Sig: Take 1 tablet (15 mg total) by mouth 3 (three) times daily.    Dispense:  90 tablet    Refill:  0    Order Specific Question:   Supervising Provider    Answer:   Tresa Garter [id number]  . DISCONTD: escitalopram (LEXAPRO) 10 MG tablet    Sig: Take 2 tablets (20 mg total) by mouth at bedtime.    Dispense:  60 tablet    Refill:  0    Order Specific Question:   Supervising Provider    Answer:   Tresa Garter [id number]  . escitalopram (LEXAPRO) 10 MG tablet    Sig: Take 2 tablets (20 mg total) by mouth at bedtime.    Dispense:  60 tablet    Refill:  4    Order Specific Question:   Supervising Provider    Answer:   Tresa Garter  [[id number]]    Return for care 2 weeks for re-evaluation of depression and anxiety.   Carroll Sage. Kenton Kingfisher, MSN, FNP-C [facility]  [address] [place], [place] [address] [phone]

## 2017-07-27 ENCOUNTER — Telehealth: Payer: Self-pay | Admitting: Family Medicine

## 2017-07-27 LAB — UNLABELED: Test Ordered On Req: 395

## 2017-07-27 NOTE — Telephone Encounter (Signed)
Forms faxed

## 2017-07-27 NOTE — Telephone Encounter (Signed)
Please complete [facility] referral form with patients personal information and scan form fax form. I also completed PCA form, please fax to [facility].  Carroll Sage. Kenton Kingfisher, MSN, FNP-C [facility]  [address], [place] [address] [phone]

## 2017-07-31 ENCOUNTER — Encounter (HOSPITAL_COMMUNITY): Payer: Self-pay | Admitting: Emergency Medicine

## 2017-07-31 ENCOUNTER — Emergency Department (HOSPITAL_COMMUNITY): Payer: Medicaid Other

## 2017-07-31 ENCOUNTER — Emergency Department (HOSPITAL_COMMUNITY)
Admission: EM | Admit: 2017-07-31 | Discharge: 2017-08-01 | Disposition: A | Discharge status: Home / Self Care | Payer: Medicaid Other | Attending: Emergency Medicine | Admitting: Emergency Medicine

## 2017-07-31 DIAGNOSIS — I10 Essential (primary) hypertension: Secondary | ICD-10-CM | POA: Insufficient documentation

## 2017-07-31 DIAGNOSIS — G8929 Other chronic pain: Secondary | ICD-10-CM | POA: Diagnosis present

## 2017-07-31 DIAGNOSIS — Z79899 Other long term (current) drug therapy: Secondary | ICD-10-CM | POA: Insufficient documentation

## 2017-07-31 DIAGNOSIS — F17210 Nicotine dependence, cigarettes, uncomplicated: Secondary | ICD-10-CM | POA: Diagnosis not present

## 2017-07-31 MED ORDER — NAPROXEN 500 MG PO TABS
500.0000 mg | ORAL_TABLET | Freq: Once | ORAL | Status: AC
  Administered 2017-07-31: 500 mg via ORAL
  Filled 2017-07-31: qty 1

## 2017-07-31 MED ORDER — GABAPENTIN 300 MG PO CAPS
300.0000 mg | ORAL_CAPSULE | Freq: Once | ORAL | Status: AC
  Administered 2017-07-31: 300 mg via ORAL
  Filled 2017-07-31 (×2): qty 1

## 2017-07-31 NOTE — ED Triage Notes (Signed)
Pt brought from home per EMS with c/o bilat LE pain post BKA procedure x1 month ago. Pt is very restless and loudly c/o pain

## 2017-07-31 NOTE — ED Provider Notes (Signed)
[facility] DEPT Provider Note   CSN: [id number] Arrival date & time: 07/31/17  2308     History   Chief Complaint Chief Complaint  Patient presents with  . Leg Pain    Bilat amputee BKA pain    HPI [name] is a 56 y.o. female.  Patient presents to the ED with a chief complaint of chronic pain.  She states that she has chronic pain in her bilateral BKAs.  She states that she bumped them on her wheel chair, and believes that this triggered her pain.  She denies any other new injuries.  There are no modifying factors.  There are no other associated symptoms.     The history is provided by the patient. No language interpreter was used.    Past Medical History:  Diagnosis Date  . CAP (community acquired pneumonia) 03/2014 X 2  . Cocaine abuse ([place])    ongoing with resultant vaculitis.  . Depression   . Headache    "weekly" (07/29/2016)  . Hypertension   . Inflammatory arthritis   . Migraines    "probably 5-6/yr" (07/29/2016)  . Normocytic anemia    BL Hgb 9.8-12. Last anemia panel 04/2010 - showing Fe 19, ferritin 101.  Pt on monthly B12 injections  . Rheumatoid arthritis(714.0)    patient reported  . 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 Followed by Dr. Louanne Skye    Patient Active Problem List   Diagnosis Date Noted  . Hypertensive crisis   . Phantom limb pain ([place])   . S/P bilateral below knee amputation ([place]) 04/13/2017  . Tobacco abuse   . Post-operative pain   . Acute blood loss anemia   . Atherosclerosis of native arteries of extremities with gangrene, left leg ([place])   . Wound infection 03/30/2017  . AKI (acute kidney injury) ([place]) 02/07/2017  . Acute kidney injury ([place]) 02/06/2017  . Wound healing, delayed   . Chest pain 04/07/2015  . Protein-calorie malnutrition, severe ([place]) 01/15/2015  . MDD (major depressive disorder), recurrent episode, severe ([place]) 10/16/2014    . Cocaine use disorder, severe, dependence ([place]) 10/10/2014  . 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 Essential hypertension 02/26/2010    Past Surgical History:  Procedure Laterality Date  . AMPUTATION Left 05/22/2016   Procedure: AMPUTATION LEFT LONG FINGER;  Surgeon: Marybelle Killings, MD;  Location: [facility];  Service: Orthopedics;  Laterality: Left;  . AMPUTATION Bilateral 04/10/2017   Procedure: AMPUTATION BELOW KNEE;  Surgeon: Newt Minion, MD;  Location: [facility];  Service: Orthopedics;  Laterality: Bilateral;  . HERNIA REPAIR     "stomach"  . I&D EXTREMITY Right 09/26/2015   Procedure: IRRIGATION AND DEBRIDEMENT LEG WOUND  VAC PLACEMENT.;  Surgeon: Loel Lofty Dillingham, DO;  Location: [facility];  Service: Plastics;  Laterality: Right;  . INCISION AND DRAINAGE OF WOUND Bilateral 10/20/2016   Procedure: IRRIGATION AND DEBRIDEMENT WOUND BILATERAL;  Surgeon: Edrick Kins, DPM;  Location: [facility];  Service: Podiatry;  Laterality: Bilateral;  . IRRIGATION AND DEBRIDEMENT ABSCESS Bilateral 09/26/2013   Procedure: DEBRIDEMENT ULCERS BILATERAL THIGHS;  Surgeon: Gwenyth Ober, MD;  Location: [facility];  Service: General;  Laterality: Bilateral;  . SKIN BIOPSY Bilateral    shin nodules    OB History    No data available       Home Medications    Prior to Admission medications   Medication Sig  Start Date End Date Taking? Authorizing Provider  busPIRone (BUSPAR) 15 MG tablet Take 1 tablet (15 mg total) by mouth 3 (three) times daily. 07/26/17   Scot Jun, FNP  cyclobenzaprine (FLEXERIL) 5 MG tablet Take 1 tablet (5 mg total) by mouth 3 (three) times daily as needed (muscle soreness). 07/04/17   Rolland Porter, MD  escitalopram (LEXAPRO) 10 MG tablet Take 2 tablets (20 mg total) by mouth at bedtime. 07/26/17   Scot Jun, FNP  gabapentin (NEURONTIN) 600 MG tablet Take 1 tablet (600 mg total) by mouth 3 (three) times daily. 07/26/17   Scot Jun, FNP  naproxen (NAPROSYN)  250 MG tablet Take 1 tablet (250 mg total) by mouth 2 (two) times daily. 07/04/17   Rolland Porter, MD  traMADol (ULTRAM) 50 MG tablet Take 2 tablets (100 mg total) by mouth every 12 (twelve) hours as needed for severe pain. 07/26/17   Scot Jun, FNP    Family History Family History  Problem Relation Age of Onset  . Breast cancer Mother        Breast cancer  . Alcohol abuse Mother   . Colon cancer Maternal Aunt 23  . Alcohol abuse Father     Social History Social History  Substance Use Topics  . Smoking status: Current Every Day Smoker    Packs/day: 0.12    Years: 38.00    Types: Cigarettes  . Smokeless tobacco: Never Used     Comment: 2 A DAY  . Alcohol use No     Allergies   Acetaminophen and Other   Review of Systems Review of Systems  All other systems reviewed and are negative.    Physical Exam Updated Vital Signs There were no vitals taken for this visit.  Physical Exam  Constitutional: She is oriented to person, place, and time.  Chronically ill appearing  HENT:  Head: Normocephalic and atraumatic.  Eyes: Pupils are equal, round, and reactive to light. Conjunctivae and EOM are normal.  Neck: Normal range of motion. Neck supple.  Cardiovascular: Normal rate and regular rhythm.  Exam reveals no gallop and no friction rub.   No murmur heard. Pulmonary/Chest: Effort normal and breath sounds normal. No respiratory distress. She has no wheezes. She has no rales. She exhibits no tenderness.  Abdominal: Soft. Bowel sounds are normal. She exhibits no distension and no mass. There is no tenderness. There is no rebound and no guarding.  Musculoskeletal: Normal range of motion. She exhibits tenderness. She exhibits no edema.  Bilateral BKAs, TTP, no bony abnormality or deformity, ROM and strength is 5/5  Neurological: She is alert and oriented to person, place, and time.  Skin: Skin is warm and dry.  Psychiatric: She has a normal mood and affect. Her behavior is  normal. Judgment and thought content normal.  Nursing note and vitals reviewed.    ED Treatments / Results  Labs (all labs ordered are listed, but only abnormal results are displayed) Labs Reviewed - No data to display  EKG  EKG Interpretation None       Radiology No results found.  Procedures Procedures (including critical care time)  Medications Ordered in ED Medications  gabapentin (NEURONTIN) capsule 300 mg (not administered)  naproxen (NAPROSYN) tablet 500 mg (not administered)     Initial Impression / Assessment and Plan / ED Course  I have reviewed the triage vital signs and the nursing notes.  Pertinent labs & imaging results that were available during my care of  the patient were reviewed by me and considered in my medical decision making (see chart for details).     Patient with BKAs.  Chronic pain syndrome.  Multiple visits for the same.  Discussed chronic pain management, and that ED does not manage chronic pain.  Will check plain films since the patient states she hit the stumps a few days ago.   Xrays show no acute abnormality.  No sign of infection on exam.  Treat pain with naprosyn and gabapentin.  PCP follow-up.  Final Clinical Impressions(s) / ED Diagnoses   Final diagnoses:  Other chronic pain    New Prescriptions New Prescriptions   No medications on file     Montine Circle, Hershal Coria 08/01/17 0043    Molpus, Jenny Reichmann, MD 08/01/17 [phone]

## 2017-08-01 LAB — URINE CULTURE
MICRO NUMBER: 81196622
SPECIMEN QUALITY:: ADEQUATE

## 2017-08-01 LAB — PAT ID TIQ DOC: TEST AFFECTED: 395

## 2017-08-01 LAB — SPECIMEN ID NOTIFICATION MISSING 2ND ID

## 2017-08-01 NOTE — ED Notes (Signed)
[facility] here to transport pt back to her home/residence.

## 2017-08-01 NOTE — ED Notes (Signed)
Pt called stating she just woke up and has bruising and swelling on legs and arms "from Tylenol ya'll gave me" Upon checking chart no Tylenol was given. Pt made aware, she is welcome to return for evaluation.

## 2017-08-03 ENCOUNTER — Other Ambulatory Visit: Payer: Self-pay | Admitting: Family Medicine

## 2017-08-03 MED ORDER — SULFAMETHOXAZOLE-TRIMETHOPRIM 800-160 MG PO TABS: 1.0000 | ORAL_TABLET | Freq: Two times a day (BID) | ORAL | 0 refills | Status: DC

## 2017-08-03 NOTE — Progress Notes (Unsigned)
Please advise patient  According to C&S for urine culture, will e-prescribed Bactrim twice daily x 10 days to treat UTI symptoms.  Carroll Sage. Kenton Kingfisher, MSN, FNP-C [facility]  [address] [place], [place] [address] [phone]

## 2017-08-04 NOTE — Progress Notes (Signed)
Left a vm for patient to callback regarding lab results

## 2017-08-06 NOTE — Progress Notes (Signed)
Patient notified

## 2017-08-06 NOTE — Progress Notes (Signed)
Tried to call patient again no answer

## 2017-08-09 ENCOUNTER — Encounter (HOSPITAL_COMMUNITY): Payer: Self-pay

## 2017-08-09 ENCOUNTER — Emergency Department (HOSPITAL_COMMUNITY)
Admission: EM | Admit: 2017-08-09 | Discharge: 2017-08-09 | Disposition: A | Discharge status: Home / Self Care | Payer: Medicaid Other | Attending: Emergency Medicine | Admitting: Emergency Medicine

## 2017-08-09 ENCOUNTER — Encounter (HOSPITAL_COMMUNITY): Payer: Self-pay | Admitting: Family Medicine

## 2017-08-09 ENCOUNTER — Emergency Department (EMERGENCY_DEPARTMENT_HOSPITAL)
Admission: EM | Admit: 2017-08-09 | Discharge: 2017-08-11 | Disposition: A | Discharge status: Home / Self Care | Payer: Medicaid Other | Source: Home / Self Care | Attending: Emergency Medicine | Admitting: Emergency Medicine

## 2017-08-09 DIAGNOSIS — F332 Major depressive disorder, recurrent severe without psychotic features: Secondary | ICD-10-CM | POA: Insufficient documentation

## 2017-08-09 DIAGNOSIS — Y829 Unspecified medical devices associated with adverse incidents: Secondary | ICD-10-CM | POA: Insufficient documentation

## 2017-08-09 DIAGNOSIS — D649 Anemia, unspecified: Secondary | ICD-10-CM | POA: Diagnosis not present

## 2017-08-09 DIAGNOSIS — Z91148 Patient's other noncompliance with medication regimen for other reason: Secondary | ICD-10-CM

## 2017-08-09 DIAGNOSIS — Z008 Encounter for other general examination: Secondary | ICD-10-CM

## 2017-08-09 DIAGNOSIS — E876 Hypokalemia: Secondary | ICD-10-CM | POA: Insufficient documentation

## 2017-08-09 DIAGNOSIS — R45851 Suicidal ideations: Secondary | ICD-10-CM

## 2017-08-09 DIAGNOSIS — F1414 Cocaine abuse with cocaine-induced mood disorder: Secondary | ICD-10-CM | POA: Diagnosis present

## 2017-08-09 DIAGNOSIS — T8744 Infection of amputation stump, left lower extremity: Secondary | ICD-10-CM | POA: Insufficient documentation

## 2017-08-09 DIAGNOSIS — R440 Auditory hallucinations: Secondary | ICD-10-CM | POA: Diagnosis not present

## 2017-08-09 DIAGNOSIS — Z89612 Acquired absence of left leg above knee: Secondary | ICD-10-CM | POA: Diagnosis not present

## 2017-08-09 DIAGNOSIS — I10 Essential (primary) hypertension: Secondary | ICD-10-CM | POA: Insufficient documentation

## 2017-08-09 DIAGNOSIS — F1420 Cocaine dependence, uncomplicated: Secondary | ICD-10-CM | POA: Diagnosis present

## 2017-08-09 DIAGNOSIS — Z9114 Patient's other noncompliance with medication regimen: Secondary | ICD-10-CM

## 2017-08-09 DIAGNOSIS — T8743 Infection of amputation stump, right lower extremity: Secondary | ICD-10-CM | POA: Diagnosis not present

## 2017-08-09 DIAGNOSIS — Z79899 Other long term (current) drug therapy: Secondary | ICD-10-CM | POA: Insufficient documentation

## 2017-08-09 DIAGNOSIS — F17210 Nicotine dependence, cigarettes, uncomplicated: Secondary | ICD-10-CM | POA: Diagnosis not present

## 2017-08-09 DIAGNOSIS — F1490 Cocaine use, unspecified, uncomplicated: Secondary | ICD-10-CM

## 2017-08-09 DIAGNOSIS — F1410 Cocaine abuse, uncomplicated: Secondary | ICD-10-CM | POA: Insufficient documentation

## 2017-08-09 DIAGNOSIS — G894 Chronic pain syndrome: Secondary | ICD-10-CM

## 2017-08-09 DIAGNOSIS — F1494 Cocaine use, unspecified with cocaine-induced mood disorder: Secondary | ICD-10-CM | POA: Diagnosis not present

## 2017-08-09 DIAGNOSIS — Z720 Tobacco use: Secondary | ICD-10-CM

## 2017-08-09 DIAGNOSIS — Z89611 Acquired absence of right leg above knee: Secondary | ICD-10-CM | POA: Diagnosis not present

## 2017-08-09 DIAGNOSIS — T8789 Other complications of amputation stump: Secondary | ICD-10-CM

## 2017-08-09 LAB — COMPREHENSIVE METABOLIC PANEL
ALK PHOS: 70 U/L (ref 38–126)
ALT: 8 U/L — ABNORMAL LOW (ref 14–54)
AST: 16 U/L (ref 15–41)
Albumin: 3.1 g/dL — ABNORMAL LOW (ref 3.5–5.0)
Anion gap: 10 (ref 5–15)
BILIRUBIN TOTAL: 0.2 mg/dL — AB (ref 0.3–1.2)
BUN: 15 mg/dL (ref 6–20)
CALCIUM: 9.1 mg/dL (ref 8.9–10.3)
CO2: 25 mmol/L (ref 22–32)
CREATININE: 0.69 mg/dL (ref 0.44–1.00)
Chloride: 106 mmol/L (ref 101–111)
GFR calc Af Amer: >60 mL/min (ref >60)
GFR calc non Af Amer: >60 mL/min (ref >60)
Glucose, Bld: 123 mg/dL — ABNORMAL HIGH (ref 65–99)
Potassium: 3.2 mmol/L — ABNORMAL LOW (ref 3.5–5.1)
SODIUM: 141 mmol/L (ref 135–145)
TOTAL PROTEIN: 6.9 g/dL (ref 6.5–8.1)

## 2017-08-09 LAB — CBC
HCT: 31 % — ABNORMAL LOW (ref 36.0–46.0)
HEMOGLOBIN: 10.3 g/dL — AB (ref 12.0–15.0)
MCH: 27.8 pg (ref 26.0–34.0)
MCHC: 33.2 g/dL (ref 30.0–36.0)
MCV: 83.6 fL (ref 78.0–100.0)
Platelets: 394 10*3/uL (ref 150–400)
RBC: 3.71 MIL/uL — ABNORMAL LOW (ref 3.87–5.11)
RDW: 14.9 % (ref 11.5–15.5)
WBC: 6.6 10*3/uL (ref 4.0–10.5)

## 2017-08-09 LAB — RAPID URINE DRUG SCREEN, HOSP PERFORMED
Amphetamines: NOT DETECTED
Barbiturates: NOT DETECTED
Benzodiazepines: NOT DETECTED
Cocaine: POSITIVE — AB
OPIATES: POSITIVE — AB
TETRAHYDROCANNABINOL: POSITIVE — AB

## 2017-08-09 LAB — SALICYLATE LEVEL

## 2017-08-09 LAB — ACETAMINOPHEN LEVEL

## 2017-08-09 LAB — ETHANOL: Alcohol, Ethyl (B): <10 mg/dL (ref <10)

## 2017-08-09 MED ORDER — POTASSIUM CHLORIDE CRYS ER 20 MEQ PO TBCR
40.0000 meq | EXTENDED_RELEASE_TABLET | Freq: Once | ORAL | Status: AC
Start: 2017-08-09 — End: 2017-08-09
  Administered 2017-08-09: 40 meq via ORAL
  Filled 2017-08-09: qty 2

## 2017-08-09 MED ORDER — ZOLPIDEM TARTRATE 5 MG PO TABS: 5.0000 mg | ORAL_TABLET | Freq: Every evening | ORAL | Status: DC | PRN

## 2017-08-09 MED ORDER — ALUM & MAG HYDROXIDE-SIMETH 200-200-20 MG/5ML PO SUSP: 30.0000 mL | Freq: Four times a day (QID) | ORAL | Status: DC | PRN

## 2017-08-09 MED ORDER — HYDROMORPHONE HCL 1 MG/ML IJ SOLN
1.0000 mg | Freq: Once | INTRAMUSCULAR | Status: AC
  Administered 2017-08-09: 1 mg via INTRAMUSCULAR
  Filled 2017-08-09: qty 1

## 2017-08-09 MED ORDER — IBUPROFEN 200 MG PO TABS
600.0000 mg | ORAL_TABLET | Freq: Three times a day (TID) | ORAL | Status: DC | PRN
  Administered 2017-08-10 (×2): 600 mg via ORAL
  Filled 2017-08-09 (×2): qty 3

## 2017-08-09 MED ORDER — ONDANSETRON HCL 4 MG PO TABS: 4.0000 mg | ORAL_TABLET | Freq: Three times a day (TID) | ORAL | Status: DC | PRN

## 2017-08-09 MED ORDER — NICOTINE 21 MG/24HR TD PT24
21.0000 mg | MEDICATED_PATCH | Freq: Every day | TRANSDERMAL | Status: DC
  Administered 2017-08-09: 21 mg via TRANSDERMAL
  Filled 2017-08-09 (×2): qty 1

## 2017-08-09 MED ORDER — TRAMADOL HCL 50 MG PO TABS
100.0000 mg | ORAL_TABLET | Freq: Two times a day (BID) | ORAL | Status: DC | PRN
  Administered 2017-08-10 – 2017-08-11 (×3): 100 mg via ORAL
  Filled 2017-08-09 (×4): qty 2

## 2017-08-09 NOTE — ED Triage Notes (Signed)
Patient is reporting she is suicidal with no plan to harm herself. She reports she was at [facility] Emergency Department earlier today for infection to her leg amputations. When she arrived back home, she reports becoming suicidal. She called [facility] and was referred to the emergency department. Patient states she is tired and ready to get her life back on track.

## 2017-08-09 NOTE — ED Notes (Signed)
Pt verbalized understanding of d/c instructions. Given supplies for dressing changes.

## 2017-08-09 NOTE — ED Triage Notes (Addendum)
Pt from home via [facility] for bilateral severe leg pain. Pt with hx of bilateral AKA 4 months ago. Pt seen at [facility] today and was sent to the ED due to malodorous smell from stumps. EMS VS: 150/100, 106 HR, 96% on RA.

## 2017-08-09 NOTE — ED Notes (Signed)
Pt wounds cleaned and debrided with sterile gauze and sterile water. Pt wounds wrapped with xeroform, telfa non adherent gauze, kerlix, and coband.

## 2017-08-09 NOTE — ED Notes (Signed)
ED Provider at bedside. 

## 2017-08-09 NOTE — BH Assessment (Addendum)
Assessment Note  [name] is an 56 y.o. female.  -Clinician reviewed note by [employer] PA.  [name] is a 56 y.o. 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  Patient states "I am tired and I need some help". Chart review reveals she was seen at the [facility] ED earlier today for b/l leg pain and chronic wounds to the stumps, evaluation revealed no frank cellulitis, she was given 38m dilaudid and sent home with recommendations to continue wound care and f/up outpatient. She reports that once she arrived back home, she became suicidal so she called behavioral health Hospital who instructed her to come here for further evaluation.  She states that she stopped taking all of her medications last month because she could not afford them.  She has been having occasional auditory hallucinations hearing voices sometimes, although none currently.  She admits to using cocaine 2 days ago, and is also requesting help with getting off of cocaine.  She is a cigarette smoker.  She denies HI, visual hallucinations, or alcohol use.  She is not currently under the care of any psychiatrist.  She has no other medical complaints at this time, aside from her chronic bilateral leg pain for which she was already evaluated earlier.  She is here voluntarily requesting help with her SI and cocaine abuse.    Patient is drowsy when assessed.  She confirms that she is without a psychiatrist or therapist.  She says that she has no medications and cannot afford them.  Patient is currently living in a boarding house which is operated by her brother.  [facility]  Patient does not offer much information and is not the best historian. When asked if she was still feeling suicidal she says  "sometimes, yes."  When asked about a plan she does not have one.  Patient does say she has had a previous suicide attempt.    Pt says that she has no HI or visual hallucinations.  She does hear voices she cannot tell what they say.  Hears "noises like something creeping around."  Patient admits to using cocaine 2-3 times in a week when she can afford it.  Patient says nothing about wanting to get off it.  She does use marijuana about once per week.  Patient denies use of opiates but is positive for them.  She says that she has oxycodone and percocet prescribed by her doctor.  Patient says she gets depressed "about family problems."  But she cannot elaborate.    Patient denies having been inpatient at a psychiatric facility.  Patient says she has no outpatient provider.  -Clinician discussed patient care with SPatriciaann Clan PA who recommends inpatient psychiatric care.  Patient disposition discussed with CIrena Cords PA who was in agreement.  TTS to seek placement.  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 CAP (community acquired pneumonia) 03/2014 X 2  . Cocaine abuse ([place]    ongoing with resultant vaculitis.  . Depression   . Headache    "weekly" (07/29/2016)  . Hypertension   . Inflammatory arthritis   . Migraines    "probably 5-6/yr" (07/29/2016)  . Normocytic anemia  BL Hgb 9.8-12. Last anemia panel 04/2010 - showing Fe 19, ferritin 101.  Pt on monthly B12 injections  . Rheumatoid arthritis(714.0)    patient reported  . 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 Followed by Dr. Louanne Skye    Past Surgical History:  Procedure Laterality Date  . HERNIA REPAIR     "stomach"  . SKIN BIOPSY Bilateral    shin nodules    Family History:  Family History  Problem Relation Age of Onset  . Breast cancer Mother         Breast cancer  . Alcohol abuse Mother   . Colon cancer Maternal Aunt 53  . Alcohol abuse Father     Social History:  reports that she has been smoking cigarettes.  She has a 4.56 pack-year smoking history. she has never used smokeless tobacco. She reports that she uses drugs. Drugs: "Crack" cocaine and Cocaine. She reports that she does not drink alcohol.  Additional Social History:  Alcohol / Drug Use Pain Medications: Percocet, Oxycodone as prescribed by physician Prescriptions: See PTA medication list Over the Counter: See PTA medication list History of alcohol / drug use?: Yes Substance #1 Name of Substance 1: Cocaine (crack) 1 - Age of First Use: Can't recall 1 - Amount (size/oz): 2-3 rocks at a time 1 - Frequency: 2-3 days out of a week 1 - Duration: off and on 1 - Last Use / Amount: Two days ago? Substance #2 Name of Substance 2: Marijuana 2 - Age of First Use: Teens 2 - Amount (size/oz): Varies 2 - Frequency: About once in a week 2 - Duration: off and on 2 - Last Use / Amount: 08/06/17  CIWA: CIWA-Ar BP: (!) 156/100 Pulse Rate: 97 COWS:    Allergies:  Allergies  Allergen Reactions  . Acetaminophen Swelling and Other (See Comments)    Reaction:  Eyelid swelling  . Other Other (See Comments)    Pt states she is allergic to a blood pressure medication, but does not know the reaction or medication    Home Medications:  (Not in a hospital admission)  OB/GYN Status:  No LMP recorded. Patient is postmenopausal.  General Assessment Data [facility] of Assessment: [facility] TTS Assessment: In system Is this a Tele or Face-to-Face Assessment?: Face-to-Face Is this an Initial Assessment or a Re-assessment for this encounter?: Initial Assessment Marital status: Single Is patient pregnant?: No Pregnancy Status: No Living Arrangements: Non-relatives/Friends(In a boarding house her brother manages.) Can pt return to current living arrangement?: Yes Admission Status:  Voluntary Is patient capable of signing voluntary admission?: Yes Referral Source: Self/Family/Friend(Brother brought her to [facility] Insurance type: MCD     Crisis Care Plan Living Arrangements: Non-relatives/Friends(In a boarding house her brother manages.) Name of Psychiatrist: None Name of Therapist: None  Education Status Is patient currently in school?: No Highest grade of school patient has completed: 11th grade  Risk to self with the past 6 months Suicidal Ideation: Yes-Currently Present Has patient been a risk to self within the past 6 months prior to admission? : Yes Suicidal Intent: Yes-Currently Present Has patient had any suicidal intent within the past 6 months prior to admission? : Yes Is patient at risk for suicide?: Yes Suicidal Plan?: No Has patient had any suicidal plan within the past 6 months prior to admission? : No Access to Means: No What has been your use of drugs/alcohol within the last 12 months?: None Previous  Attempts/Gestures: Yes How many times?: 2 Other Self Harm Risks: None Triggers for Past Attempts: None known Intentional Self Injurious Behavior: None Family Suicide History: No Recent stressful life event(s): Financial Problems, Turmoil (Comment)(Gets depressed about not being around family.) Persecutory voices/beliefs?: No Depression: Yes Depression Symptoms: Despondent, Isolating, Loss of interest in usual pleasures, Feeling worthless/self pity Substance abuse history and/or treatment for substance abuse?: Yes Suicide prevention information given to non-admitted patients: Not applicable  Risk to Others within the past 6 months Homicidal Ideation: No Does patient have any lifetime risk of violence toward others beyond the six months prior to admission? : No Thoughts of Harm to Others: No Current Homicidal Intent: No Current Homicidal Plan: No Access to Homicidal Means: No Identified Victim: No one History of harm to others?:  No Assessment of Violence: None Noted Violent Behavior Description: None Does patient have access to weapons?: No Criminal Charges Pending?: No Does patient have a court date: No Is patient on probation?: No  Psychosis Hallucinations: Auditory(Will hear noises and voices but can't understand them.) Delusions: None noted  Mental Status Report Appearance/Hygiene: Disheveled, In scrubs Eye Contact: Poor Motor Activity: Freedom of movement, Other (Comment)(Bilateral amputation below knee) Speech: Logical/coherent, Soft Level of Consciousness: Drowsy Mood: Depressed, Despair, Helpless, Sad Affect: Depressed, Blunted, Sad Anxiety Level: Moderate Thought Processes: Coherent, Relevant Judgement: Unimpaired Orientation: Appropriate for developmental age Obsessive Compulsive Thoughts/Behaviors: None  Cognitive Functioning Concentration: Fair Memory: Recent Impaired, Remote Impaired IQ: Average Insight: Poor Impulse Control: Good Appetite: Good Weight Loss: 0 Weight Gain: 0 Sleep: No Change Total Hours of Sleep: 6 Vegetative Symptoms: Staying in bed  ADLScreening [facility] Assessment Services) Patient's cognitive ability adequate to safely complete daily activities?: Yes Patient able to express need for assistance with ADLs?: Yes Independently performs ADLs?: Yes (appropriate for developmental age)  Prior Inpatient Therapy Prior Inpatient Therapy: No Prior Therapy Dates: None Prior Therapy Facilty/Provider(s): Pt says no Reason for Treatment: Pt says no  Prior Outpatient Therapy Prior Outpatient Therapy: No Prior Therapy Dates: Unknown Prior Therapy Facilty/Provider(s): Unknown Reason for Treatment: Unknown Does patient have an ACCT team?: No Does patient have Intensive In-House Services?  : No Does patient have [facility] services? : No Does patient have P4CC services?: No  ADL Screening (condition at time of admission) Patient's cognitive ability adequate to safely  complete daily activities?: Yes Is the patient deaf or have difficulty hearing?: No Does the patient have difficulty seeing, even when wearing glasses/contacts?: No Does the patient have difficulty concentrating, remembering, or making decisions?: No Patient able to express need for assistance with ADLs?: Yes Does the patient have difficulty dressing or bathing?: No Independently performs ADLs?: Yes (appropriate for developmental age) Does the patient have difficulty walking or climbing stairs?: Yes Weakness of Legs: Both(bilateral BKA) Weakness of Arms/Hands: None       Abuse/Neglect Assessment (Assessment to be complete while patient is alone) Abuse/Neglect Assessment Can Be Completed: Yes Physical Abuse: Denies Verbal Abuse: Denies Sexual Abuse: Denies Exploitation of patient/patient's resources: Denies Self-Neglect: Denies     Regulatory affairs officer (For Healthcare) Does Patient Have a Medical Advance Directive?: No Would patient like information on creating a medical advance directive?: No - Patient declined    Additional Information 1:1 In Past 12 Months?: No CIRT Risk: No Elopement Risk: No Does patient have medical clearance?: Yes     Disposition:  Disposition Initial Assessment Completed for this Encounter: Yes Disposition of Patient: Other dispositions Other disposition(s): Other (Comment)(To be reviewed by PA)  On Site  Evaluation by:   Reviewed with Physician:    Curlene Dolphin Ray 08/09/2017 10:54 PM

## 2017-08-09 NOTE — ED Notes (Signed)
Pt requesting additional pain medication at 14:18. Was crying loudly and kicking her legs.

## 2017-08-09 NOTE — ED Notes (Signed)
Bed: FV88 Expected date:  Expected time:  Means of arrival:  Comments: Triage 3

## 2017-08-09 NOTE — ED Provider Notes (Signed)
[place] DEPT Provider Note   CSN: [id number] Arrival date & time: 08/09/17  1853     History   Chief Complaint Chief Complaint  Patient presents with  . Suicidal    HPI [name] is a 56 y.o. 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  Patient states "I am tired and I need some help". Chart review reveals she was seen at the [facility] ED earlier today for b/l leg pain and chronic wounds to the stumps, evaluation revealed no frank cellulitis, she was given 4m dilaudid and sent home with recommendations to continue wound care and f/up outpatient. She reports that once she arrived back home, she became suicidal so she called behavioral health Hospital who instructed her to come here for further evaluation.  She states that she stopped taking all of her medications last month because she could not afford them.  She has been having occasional auditory hallucinations hearing voices sometimes, although none currently.  She admits to using cocaine 2 days ago, and is also requesting help with getting off of cocaine.  She is a cigarette smoker.  She denies HI, visual hallucinations, or alcohol use.  She is not currently under the care of any psychiatrist.  She has no other medical complaints at this time, aside from her chronic bilateral leg pain for which she was already evaluated earlier.  She is here voluntarily requesting help with her SI and cocaine abuse.    The history is provided by the patient and medical records. No language interpreter was used.  Mental Health Problem  Presenting symptoms: hallucinations and suicidal thoughts   Presenting symptoms: no homicidal ideas   Onset quality:  Sudden Duration:  5 hours Timing:  Constant Progression:  Unchanged Chronicity:   Recurrent Context: drug abuse, noncompliance and stressful life event   Treatment compliance:  None of the time Time since last psychoactive medication taken:  1 month Relieved by:  None tried Worsened by:  Drugs Ineffective treatments:  None tried Associated symptoms: no abdominal pain and no chest pain   Risk factors: hx of mental illness   Risk factors: no recent psychiatric admission     Past Medical History:  Diagnosis Date  . CAP (community acquired pneumonia) 03/2014 X 2  . Cocaine abuse ([place]    ongoing with resultant vaculitis.  . Depression   . Headache    "weekly" (07/29/2016)  . Hypertension   . Inflammatory arthritis   . Migraines    "probably 5-6/yr" (07/29/2016)  . Normocytic anemia    BL Hgb 9.8-12. Last anemia panel 04/2010 - showing Fe 19, ferritin 101.  Pt on monthly B12 injections  . Rheumatoid arthritis(714.0)    patient reported  . 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 Followed by Dr. NLouanne Skye   Patient Active Problem List   Diagnosis Date Noted  . Hypertensive crisis   . Phantom limb pain ([place]   . S/P bilateral below knee amputation ([place] 04/13/2017  . Tobacco abuse   . Post-operative pain   . Acute blood loss anemia   . Atherosclerosis of native arteries of extremities with gangrene, left leg ([place]   . Wound infection 03/30/2017  . AKI (acute kidney injury) ([place] 02/07/2017  . Acute  kidney injury ([place]) 02/06/2017  . Wound healing, delayed   . Chest pain 04/07/2015  . Protein-calorie malnutrition, severe ([place]) 01/15/2015  . MDD (major depressive disorder), recurrent episode, severe ([place]) 10/16/2014  . Cocaine use disorder, severe, dependence ([place]) 10/10/2014  . 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 Essential hypertension 02/26/2010    Past Surgical History:  Procedure Laterality Date  . HERNIA REPAIR     "stomach"  . SKIN BIOPSY Bilateral    shin nodules    OB  History    No data available       Home Medications    Prior to Admission medications   Medication Sig Start Date End Date Taking? Authorizing Provider  busPIRone (BUSPAR) 15 MG tablet Take 1 tablet (15 mg total) by mouth 3 (three) times daily. Patient not taking: Reported on 08/09/2017 07/26/17   Scot Jun, FNP  cyclobenzaprine (FLEXERIL) 5 MG tablet Take 1 tablet (5 mg total) by mouth 3 (three) times daily as needed (muscle soreness). Patient not taking: Reported on 08/09/2017 07/04/17   Rolland Porter, MD  escitalopram (LEXAPRO) 10 MG tablet Take 2 tablets (20 mg total) by mouth at bedtime. Patient not taking: Reported on 08/09/2017 07/26/17   Scot Jun, FNP  gabapentin (NEURONTIN) 600 MG tablet Take 1 tablet (600 mg total) by mouth 3 (three) times daily. Patient not taking: Reported on 08/09/2017 07/26/17   Scot Jun, FNP  naproxen (NAPROSYN) 250 MG tablet Take 1 tablet (250 mg total) by mouth 2 (two) times daily. Patient not taking: Reported on 08/09/2017 07/04/17   Rolland Porter, MD  sulfamethoxazole-trimethoprim (BACTRIM DS,SEPTRA DS) 800-160 MG tablet Take 1 tablet by mouth 2 (two) times daily. Patient not taking: Reported on 08/09/2017 08/03/17   Scot Jun, FNP  traMADol (ULTRAM) 50 MG tablet Take 2 tablets (100 mg total) by mouth every 12 (twelve) hours as needed for severe pain. 07/26/17   Scot Jun, FNP    Family History Family History  Problem Relation Age of Onset  . Breast cancer Mother        Breast cancer  . Alcohol abuse Mother   . Colon cancer Maternal Aunt 24  . Alcohol abuse Father     Social History Social History   Tobacco Use  . Smoking status: Current Every Day Smoker    Packs/day: 0.12    Years: 38.00    Pack years: 4.56    Types: Cigarettes  . Smokeless tobacco: Never Used  . Tobacco comment: 2 A DAY  Substance Use Topics  . Alcohol use: No    Alcohol/week: 0.0 oz  . Drug use: Yes    Types: "Crack" cocaine,  Cocaine    Comment: Smoked crack 06/02/2017     Allergies   Acetaminophen and Other   Review of Systems Review of Systems  Constitutional: Negative for chills and fever.  Respiratory: Negative for shortness of breath.   Cardiovascular: Negative for chest pain.  Gastrointestinal: Negative for abdominal pain, constipation, diarrhea, nausea and vomiting.  Genitourinary: Negative for dysuria and hematuria.  Musculoskeletal: Negative for arthralgias (chronic leg pain, unchanged) and myalgias.  Skin: Negative for color change.  Allergic/Immunologic: Negative for immunocompromised state.  Neurological: Negative for weakness and numbness.  Psychiatric/Behavioral: Positive for hallucinations and suicidal ideas. Negative for confusion and homicidal ideas.   All other systems reviewed and are negative for acute change except as noted in the HPI.    Physical Exam Updated Vital Signs BP (!) 156/100 (  BP Location: Right Arm)   Pulse 97   Temp 98.4 F (36.9 C) (Oral)   Resp 20   SpO2 100%   Physical Exam  Constitutional: She is oriented to person, place, and time. Vital signs are normal. She appears well-developed and well-nourished.  Non-toxic appearance. No distress.  Afebrile, nontoxic, NAD  HENT:  Head: Normocephalic and atraumatic.  Mouth/Throat: Oropharynx is clear and moist and mucous membranes are normal.  Eyes: Conjunctivae and EOM are normal. Right eye exhibits no discharge. Left eye exhibits no discharge.  Neck: Normal range of motion. Neck supple.  Cardiovascular: Normal rate, regular rhythm, normal heart sounds and intact distal pulses. Exam reveals no gallop and no friction rub.  No murmur heard. Pulmonary/Chest: Effort normal and breath sounds normal. No respiratory distress. She has no decreased breath sounds. She has no wheezes. She has no rhonchi. She has no rales.  Abdominal: Soft. Normal appearance and bowel sounds are normal. She exhibits no distension. There is no  tenderness. There is no rigidity, no rebound, no guarding, no CVA tenderness, no tenderness at McBurney's point and negative [name]'s sign.  Musculoskeletal: Normal range of motion.  B/l BKA stumps dressed with coban, dressings c/d/i MAE x4  Neurological: She is alert and oriented to person, place, and time. She has normal strength. No sensory deficit.  Skin: Skin is warm, dry and intact. No rash noted.  Psychiatric: She is actively hallucinating. She exhibits a depressed mood. She expresses suicidal ideation. She expresses no homicidal ideation. She expresses no suicidal plans and no homicidal plans.  Depressed affect, but pleasant and cooperative. Endorsing SI without a plan, endorsing auditory hallucinations, denies HI or VH, doesn't seem to be responding to internal stimuli.   Nursing note and vitals reviewed.    ED Treatments / Results  Labs (all labs ordered are listed, but only abnormal results are displayed) Labs Reviewed  COMPREHENSIVE METABOLIC PANEL - Abnormal; Notable for the following components:      Result Value   Potassium 3.2 (*)    Glucose, Bld 123 (*)    Albumin 3.1 (*)    ALT 8 (*)    Total Bilirubin 0.2 (*)    All other components within normal limits  ACETAMINOPHEN LEVEL - Abnormal; Notable for the following components:   Acetaminophen (Tylenol), Serum <10 (*)    All other components within normal limits  CBC - Abnormal; Notable for the following components:   RBC 3.71 (*)    Hemoglobin 10.3 (*)    HCT 31.0 (*)    All other components within normal limits  ETHANOL  SALICYLATE LEVEL  RAPID URINE DRUG SCREEN, HOSP PERFORMED    EKG  EKG Interpretation None       Radiology No results found.  Procedures Procedures (including critical care time)  Medications Ordered in ED Medications  potassium chloride SA (K-DUR,KLOR-CON) CR tablet 40 mEq (not administered)  ibuprofen (ADVIL,MOTRIN) tablet 600 mg (not administered)  zolpidem (AMBIEN) tablet 5 mg  (not administered)  ondansetron (ZOFRAN) tablet 4 mg (not administered)  alum & mag hydroxide-simeth (MAALOX/MYLANTA) 200-200-20 MG/5ML suspension 30 mL (not administered)  nicotine (NICODERM CQ - dosed in mg/24 hours) patch 21 mg (not administered)  traMADol (ULTRAM) tablet 100 mg (not administered)     Initial Impression / Assessment and Plan / ED Course  I have reviewed the triage vital signs and the nursing notes.  Pertinent labs & imaging results that were available during my care of the patient were reviewed by  me and considered in my medical decision making (see chart for details).     56 y.o. female here with SI without a plan, reports occasional auditory hallucinations as well, denies HI/VH, admits to cocaine use but no other drug use, denies EtOH use. +Tobacco user, smoking cessation advised. Stopped all her meds last month, including psych meds. On exam, b/l BKA with stumps dressed with coban, depressed affect, but otherwise unremarkable exam. Work up thus far reveals: CBC with stable anemia, CMP/EtOH/Salicylate/Acetaminophen results pending. Will await these and reassess shortly.   9:19 PM CMP with mild hypokalemia 3.2, will replete here. EtOH level undetectable. Salicylate and acetaminophen levels WNL. UDS pending, but does not interfere with med clearance. Pt medically cleared at this time. Psych hold orders placed. Will reorder her tramadol since that's the only medication she reports still being on; however I did not restart any other home medications as she hasn't been on these in at least one month, and couldn't recall what the meds she was supposed to be on were; will leave that decision up to psychiatric team on whether to restart the meds. Please see TTS notes for further documentation of care/dispo. PLEASE NOTE THAT PT IS HERE VOLUNTARILY AT THIS TIME, IF PT TRIES TO LEAVE THEY WOULD NEED IVC PAPERWORK TAKEN OUT. Pt stable at time of med clearance.     Final Clinical  Impressions(s) / ED Diagnoses   Final diagnoses:  Suicidal ideation  Cocaine use  Tobacco use  Auditory hallucinations  Noncompliance with medication regimen  Medical clearance for psychiatric admission  Chronic anemia  Hypokalemia    ED Discharge Orders    [address] [place], [place] 08/09/17 2119    Sherwood Gambler, MD 08/09/17 2320

## 2017-08-09 NOTE — ED Notes (Signed)
Gave report to Mendel Ryder, Therapist, sports for [employer].

## 2017-08-09 NOTE — ED Notes (Signed)
PT has literally been crying out and moaning since being back in room; MD informed dressings removed which caused her much pain and wounds will need to be cleaned before being redressed. Pain meds ordered.

## 2017-08-09 NOTE — ED Notes (Signed)
Dr. Johnney Killian notified pt potential sepsis pt.

## 2017-08-09 NOTE — ED Notes (Signed)
Bed: [facility]3 Expected date:  Expected time:  Means of arrival:  Comments: 

## 2017-08-09 NOTE — ED Provider Notes (Signed)
[facility] EMERGENCY DEPARTMENT Provider Note   CSN: [id number] Arrival date & time: 08/09/17  1232     History   Chief Complaint Chief Complaint  Patient presents with  . Wound Infection    HPI [name] is a 56 y.o. female.  HPI Has had chronic pain in her amputation sites.  Amputations were done approximately 4 months ago.  Patient had chronic nonhealing lower extremity wounds.  Patient has had multiple evaluations for chronic lower extremity pain both proceeding or amputations and since.  She reports that she has run out of materials for changing her dressings. Past Medical History:  Diagnosis Date  . CAP (community acquired pneumonia) 03/2014 X 2  . Cocaine abuse ([place])    ongoing with resultant vaculitis.  . Depression   . Headache    "weekly" (07/29/2016)  . Hypertension   . Inflammatory arthritis   . Migraines    "probably 5-6/yr" (07/29/2016)  . Normocytic anemia    BL Hgb 9.8-12. Last anemia panel 04/2010 - showing Fe 19, ferritin 101.  Pt on monthly B12 injections  . Rheumatoid arthritis(714.0)    patient reported  . 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 Followed by Dr. Louanne Skye    Patient Active Problem List   Diagnosis Date Noted  . Hypertensive crisis   . Phantom limb pain ([place])   . S/P bilateral below knee amputation ([place]) 04/13/2017  . Tobacco abuse   . Post-operative pain   . Acute blood loss anemia   . 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 Wound infection 03/30/2017  . AKI (acute kidney injury) ([place]) 02/07/2017  . Acute kidney injury ([place]) 02/06/2017  . Wound healing, delayed   . Chest pain 04/07/2015  . Protein-calorie malnutrition, severe ([place]) 01/15/2015  . MDD (major depressive disorder), recurrent episode, severe ([place]) 10/16/2014  . Cocaine use disorder, severe, dependence ([place]) 10/10/2014  . 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 Essential hypertension 02/26/2010    Past Surgical History:  Procedure Laterality Date  . HERNIA REPAIR     "stomach"  . SKIN BIOPSY Bilateral    shin nodules    OB History    No data available       Home Medications    Prior to Admission medications   Medication Sig Start Date End Date Taking? Authorizing Provider  busPIRone (BUSPAR) 15 MG tablet Take 1 tablet (15 mg total) by mouth 3 (three) times daily. 07/26/17   Scot Jun, FNP  cyclobenzaprine (FLEXERIL) 5 MG tablet Take 1 tablet (5 mg total) by mouth 3 (three) times daily as needed (muscle soreness). 07/04/17   Rolland Porter, MD  escitalopram (LEXAPRO) 10 MG tablet Take 2 tablets (20 mg total) by mouth at bedtime. 07/26/17   Scot Jun, FNP  gabapentin (NEURONTIN) 600 MG tablet Take 1 tablet (600 mg total) by mouth 3 (three) times daily. 07/26/17   Scot Jun, FNP  naproxen (NAPROSYN) 250 MG tablet Take 1 tablet (250 mg total) by mouth 2 (two) times daily. 07/04/17   Rolland Porter, MD  sulfamethoxazole-trimethoprim (BACTRIM DS,SEPTRA DS) 800-160 MG tablet Take 1 tablet by mouth 2 (two) times daily. 08/03/17   Scot Jun, FNP  traMADol (ULTRAM) 50 MG tablet Take 2 tablets (100 mg total) by mouth every 12 (twelve) hours as needed for severe pain. 07/26/17   Molli Barrows  S, FNP    Family History Family History  Problem Relation Age of Onset  . Breast cancer Mother        Breast cancer  . Alcohol abuse Mother   . Colon cancer Maternal Aunt 79  . Alcohol abuse Father     Social History Social History   Tobacco Use  . Smoking status: Current Every Day Smoker    Packs/day: 0.12    Years: 38.00    Pack years: 4.56    Types: Cigarettes  . Smokeless tobacco: Never Used  . Tobacco comment: 2 A DAY  Substance Use Topics  . Alcohol use: No    Alcohol/week: 0.0 oz  . Drug use: Yes    Types: "Crack" cocaine, Cocaine    Comment: Smoked crack 06/02/2017      Allergies   Acetaminophen and Other   Review of Systems Review of Systems 10 Systems reviewed and are negative for acute change except as noted in the HPI.  Physical Exam Updated Vital Signs BP (!) 171/69   Pulse (!) 101   Temp 98.4 F (36.9 C) (Oral)   Resp 20   Ht 5' 1"  (1.549 m)   Wt 36.3 kg (80 lb)   SpO2 99%   BMI 15.12 kg/m   Physical Exam  Constitutional:  Patient is nontoxic.  She is constantly crying.  Respiratory distress.  Patient is extremely thin and poorly nutrition.  HENT:  Head: Normocephalic and atraumatic.  Eyes: EOM are normal.  Cardiovascular: Normal rate, regular rhythm, normal heart sounds and intact distal pulses.  Pulmonary/Chest: Effort normal and breath sounds normal.  Abdominal: Soft. She exhibits no distension. There is no tenderness. There is no guarding.  Musculoskeletal:  Patient has bilateral below-the-knee amputations.  Left has granulation tissue with persisting open wound.  No cellulitis around wound margins.  Scant amount of exudative discharge.            ED Treatments / Results  Labs (all labs ordered are listed, but only abnormal results are displayed) Labs Reviewed - No data to display  EKG  EKG Interpretation None       Radiology No results found.  Procedures Procedures (including critical care time)  Medications Ordered in ED Medications  HYDROmorphone (DILAUDID) injection 1 mg (1 mg Intramuscular Given 08/09/17 1333)     Initial Impression / Assessment and Plan / ED Course  I have reviewed the triage vital signs and the nursing notes.  Pertinent labs & imaging results that were available during my care of the patient were reviewed by me and considered in my medical decision making (see chart for details).      Final Clinical Impressions(s) / ED Diagnoses   Final diagnoses:  Chronic lower extremity wounds.  Patient has had chronic poorly healing wounds.  At this time however wound appears  consistent with granulation tissue without active cellulitis or secondary infection.  Chronic pain with these wounds has been an ongoing situation.  Dressings have been changed and wounds cleaned.  Dressing materials have been provided for the patient to use at home.  Plan will be to continue home wound care.  ED Discharge Orders    None       Charlesetta Shanks, MD 08/09/17 1549

## 2017-08-09 NOTE — ED Notes (Signed)
Confirmed with pt and pt's family that pt lives in a house with multiple roommates who will be present when pt returns. Family notified pt is being discharged. PTAR notified there will be people there. PT[facility] originally transported pt to [facility] and earlier confirmed that people were present at pt's home.

## 2017-08-09 NOTE — ED Notes (Signed)
This RN attempted to clean and dress pt wounds. Pt screaming and crying in pain, kept moving her legs and would not allow this RN to clean. Will wait to clean and dress with help from [place], [place]

## 2017-08-09 NOTE — ED Notes (Signed)
PTAR called  

## 2017-08-10 DIAGNOSIS — F419 Anxiety disorder, unspecified: Secondary | ICD-10-CM

## 2017-08-10 DIAGNOSIS — Z89611 Acquired absence of right leg above knee: Secondary | ICD-10-CM | POA: Diagnosis not present

## 2017-08-10 DIAGNOSIS — F1414 Cocaine abuse with cocaine-induced mood disorder: Secondary | ICD-10-CM | POA: Diagnosis present

## 2017-08-10 DIAGNOSIS — M79604 Pain in right leg: Secondary | ICD-10-CM | POA: Diagnosis not present

## 2017-08-10 DIAGNOSIS — Z811 Family history of alcohol abuse and dependence: Secondary | ICD-10-CM

## 2017-08-10 DIAGNOSIS — R45851 Suicidal ideations: Secondary | ICD-10-CM

## 2017-08-10 DIAGNOSIS — Z89612 Acquired absence of left leg above knee: Secondary | ICD-10-CM

## 2017-08-10 DIAGNOSIS — M79605 Pain in left leg: Secondary | ICD-10-CM | POA: Diagnosis not present

## 2017-08-10 DIAGNOSIS — R4587 Impulsiveness: Secondary | ICD-10-CM | POA: Diagnosis not present

## 2017-08-10 DIAGNOSIS — F1494 Cocaine use, unspecified with cocaine-induced mood disorder: Secondary | ICD-10-CM | POA: Diagnosis not present

## 2017-08-10 DIAGNOSIS — R450 Nervousness: Secondary | ICD-10-CM | POA: Diagnosis not present

## 2017-08-10 DIAGNOSIS — Z736 Limitation of activities due to disability: Secondary | ICD-10-CM

## 2017-08-10 MED ORDER — HYDRALAZINE HCL 10 MG PO TABS
10.0000 mg | ORAL_TABLET | Freq: Once | ORAL | Status: AC
  Administered 2017-08-10: 10 mg via ORAL
  Filled 2017-08-10: qty 1

## 2017-08-10 NOTE — BHH Suicide Risk Assessment (Signed)
Suicide Risk Assessment  Discharge Assessment   [facility] Discharge Suicide Risk Assessment   Principal Problem: Cocaine-induced mood disorder with depressive symptoms [facility]) Discharge Diagnoses:  Patient Active Problem List   Diagnosis Date Noted  . 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 Hypertensive crisis [I16.9]   . Phantom limb pain ([place]) [G54.6]   . S/P bilateral below knee amputation ([place]) [D98.338, Z89.511] 04/13/2017  . Tobacco abuse [Z72.0]   . Post-operative pain [G89.18]   . Acute blood loss anemia [D62]   . Atherosclerosis of native arteries of extremities with gangrene, left leg ([facility]) [I70.262]   . Wound infection [T14.8XXA, L08.9] 03/30/2017  . AKI (acute kidney injury) ([place]) [N17.9] 02/07/2017  . Acute kidney injury ([place]) [N17.9] 02/06/2017  . Wound healing, delayed [T14.8XXD]   . Chest pain [R07.9] 04/07/2015  . Protein-calorie malnutrition, severe ([place]) [E43] 01/15/2015  . MDD (major depressive disorder), recurrent episode, severe ([place]) [F33.2] 10/16/2014  . Cocaine use disorder, severe, dependence ([place]) [F14.20] 10/10/2014  . 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 Essential hypertension [I10] 02/26/2010    Total Time spent with patient: 45 minutes  Musculoskeletal: Strength & Muscle Tone: within normal limits Gait & Station: normal Patient leans: N/A  Psychiatric Specialty Exam: Physical Exam  Constitutional: She is oriented to person, place, and time. She appears well-developed and well-nourished.  Respiratory: Effort normal.  Neurological: She is alert and oriented to person, place, and time.  Psychiatric: Her speech is normal and behavior is normal. Thought content normal. Her mood appears anxious. Cognition and memory are normal. She expresses impulsivity. She exhibits a depressed mood.   Review of Systems  Psychiatric/Behavioral: Positive for depression and substance abuse.  Negative for hallucinations, memory loss and suicidal ideas. The patient is nervous/anxious. The patient does not have insomnia.   All other systems reviewed and are negative.  Blood pressure (!) 176/83, pulse 93, temperature 98.1 F (36.7 C), temperature source Oral, resp. rate 18, SpO2 94 %.There is no height or weight on file to calculate BMI. General Appearance: Casual Eye Contact:  Good Speech:  Clear and Coherent and Normal Rate Volume:  Normal Mood:  Anxious and Depressed Affect:  Congruent and Depressed Thought Process:  Coherent, Goal Directed and Linear Orientation:  Full (Time, Place, and Person) Thought Content:  Logical Suicidal Thoughts:  No Homicidal Thoughts:  No Memory:  Immediate;   Good Recent;   Good Remote;   Fair Judgement:  Good Insight:  Fair Psychomotor Activity:  Normal Concentration:  Concentration: Good and Attention Span: Good Recall:  Good Fund of Knowledge:  Good Language:  Good Akathisia:  No Handed:  Right AIMS (if indicated):    Assets:  Agricultural consultant Housing Social Support ADL's:  Intact Cognition:  WNL   Mental Status Per Nursing Assessment::   On Admission:   suicidal ideation without a plan  Demographic Factors:  Low socioeconomic status, Living alone and Unemployed  Loss Factors: Financial problems/change in socioeconomic status  Historical Factors: Impulsivity  Risk Reduction Factors:   Religious beliefs about death  Continued Clinical Symptoms:  Depression:   Impulsivity Alcohol/Substance Abuse/Dependencies More than one psychiatric diagnosis Previous Psychiatric Diagnoses and Treatments Medical Diagnoses and Treatments/Surgeries  Cognitive Features That Contribute To Risk:  Closed-mindedness    Suicide Risk:  Minimal: No identifiable suicidal ideation.  Patients presenting with no risk factors but with morbid ruminations; may be classified as minimal risk based on the severity  of the depressive  symptoms  Follow-up Information    [facility] Follow up.   Specialty:  Catering manager information: [facility] [place] [address] [phone]        Services, [facility] Follow up.   Why:  Please follow up for substance abuse services. Walk-in services on Friday 11/8 Contact information: [address] [place] [address] [phone]           Plan Of Care/Follow-up recommendations:  Activity:  as tolerated Diet:  Heart Healthy  Ethelene Hal, NP 08/10/2017, 1:20 PM

## 2017-08-10 NOTE — ED Provider Notes (Signed)
Patient reported to the staff nurse, that she is concerned about infection in her legs.  She was evaluated twice yesterday by different providers, who felt that her leg wounds were chronic and did not require intervention, beyond wound care.  She has been prescribed tramadol for pain here in the emergency department.  Currently she is complaining of discomfort only in the stump of her left BKA.  The dressing from this wound has been removed.  She has excellent granulation tissue, without drainage including bleeding or serous fluid.  There is no associated swelling or deformity.  There is no proximal streaking or abnormality of the knee.  Patient was reassured that her wound of her left BKA, is healing well and that no intervention is required at this time   Daleen Bo, MD 08/10/17 1127

## 2017-08-10 NOTE — ED Notes (Signed)
PTAR called for transport home.

## 2017-08-10 NOTE — ED Notes (Signed)
Patient c/o pain at both amputation sites.  She reports coban dressings are too tight.  Nurse removed coban dressings and placed ace wrap dressing on right lower extremity over the existing kerlix.  Left lower extremity had some serosanguinous and purulent discharge on kerlix.  Patient reports it has had an unpleasant odor.  MD Eulis Foster notified.

## 2017-08-10 NOTE — Patient Outreach (Signed)
ED Peer Support Specialist Patient Intake (Complete at intake & 30-60 Day Follow-up)  Name: [name]  MRN: [id number]  Age: 56 y.o.   Date of Admission: 08/10/2017  Intake: Initial Comments:      Primary Reason Admitted: [name] is a 56 y.o. 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 Patient states "I am tired and I need some help". Chart review reveals she was seen at the [facility] ED earlier today for b/l leg pain and chronic wounds to the stumps, evaluation revealed no frank cellulitis, she was given 60m dilaudid and sent home with recommendations to continue wound care and f/up outpatient. She reports that once she arrived back home, she became suicidal so she called behavioral health Hospital who instructed her to come here for further evaluation. She states that she stopped taking all of her medications last month because she could not afford them. She has been having occasional auditory hallucinations hearing voices sometimes, although none currently.   Lab values: Alcohol/ETOH: Negative Positive UDS? Yes Amphetamines:   Barbiturates:   Benzodiazepines:   Cocaine:   Opiates: Yes Cannabinoids: Yes  Demographic information: Gender: Female Ethnicity: African American Marital Status: Single Insurance Status: MMarketing executive(Work FNeurosurgeon fPhysicist, medical etc.:   Lives with: Alone Living situation: Other (comment)([facility] )  Reported Patient History: Patient reported health conditions: Hypertension, Depression Patient aware of HIV and hepatitis status: Yes (comment)  In past year, has patient visited ED for any reason? Yes(both legs amputated )  Number of ED visits: 3  Reason(s) for visit:    In past year, has patient been hospitalized  for any reason? Yes  Number of hospitalizations:    Reason(s) for hospitalization:    In past year, has patient been arrested? No  Number of arrests:    Reason(s) for arrest:    In past year, has patient been incarcerated? No  Number of incarcerations:    Reason(s) for incarceration:    In past year, has patient received medication-assisted treatment? No  In past year, patient received the following treatments:    In past year, has patient received any harm reduction services? No  Did this include any of the following?    In past year, has patient received care from a mental health provider for diagnosis other than SUD? No  In past year, is this first time patient has overdosed? No  Number of past overdoses:    In past year, is this first time patient has been hospitalized for an overdose? No  Number of hospitalizations for overdose(s):    Is patient currently receiving treatment for a mental health diagnosis? No  Patient reports experiencing difficulty participating in SUD treatment: No    Most important reason(s) for this difficulty?    Has patient received prior services for treatment?    In past, patient has received services from following agencies:    Plan of Care:  Suggested follow up at these agencies/treatment centers: [facility]  Other information: CPSS BAaron Edelmanand JJenny Reichmannspoke with Pt to make sure that she understood CPSS role in the ED. CPSS addressed the issues and concerns that of what she was addressing. [facility]. CPSS John gave Pt  information about a few treatment facilities that maybe of help for substance abuse.    Aaron Edelman [place] Wiseman, CPSS  08/10/2017 12:17 PM

## 2017-08-10 NOTE — Progress Notes (Addendum)
UPDATE: Patient does not have keys to appartment- PTAR unable to provide transportation home. Patient states her brother will pick her up tonight at [place] (brother): [phone]  CSW met with patient via bedside to discuss transportation needs. Patient is a double amputee and was requesting taxi voucher however no one was at her residence to assist with patient getting into her house. Patient primarily uses a wheel chair to get around and does not have this with her at current ED visit. CSW discussed with RN and both CSW/ RN think PTAR would be best to transport patient home at this time.   Kingsley Spittle, [facility] Emergency Room Clinical Social Worker [phone]

## 2017-08-10 NOTE — BH Assessment (Addendum)
08/10/2017: Per Dr. Constance Holster and Jinny Blossom, NP, patient does not meet criteria for INPT treatment. Patient is ok to discharge. F/u referrals provided to local mental health facilities including [place], [facility], and [facility]. Patient will also meet with peer support prior to discharge for supportive services/referrals. LCSW contacted to assist with transportation needs.

## 2017-08-10 NOTE — Consult Note (Addendum)
[facility] Consult   Reason for Consult:  Suicidal ideation without a plan Referring Physician:  EDP Patient Identification: LEMMA TETRO MRN:  [id number] Principal Diagnosis: Cocaine-induced mood disorder with depressive symptoms [facility]) Diagnosis:   Patient Active Problem List   Diagnosis Date Noted  . Cocaine-induced mood disorder with depressive symptoms ([place]) [F14.94] 08/10/2017  . Hypertensive crisis [I16.9]   . Phantom limb pain ([place]) [G54.6]   . S/P bilateral below knee amputation ([place]) [P01.410, Z89.511] 04/13/2017  . Tobacco abuse [Z72.0]   . Post-operative pain [G89.18]   . Acute blood loss anemia [D62]   . Atherosclerosis of native arteries of extremities with gangrene, left leg (HCC) [I70.262]   . Wound infection [T14.8XXA, L08.9] 03/30/2017  . AKI (acute kidney injury) ([place]) [N17.9] 02/07/2017  . Acute kidney injury ([place]) [N17.9] 02/06/2017  . Wound healing, delayed [T14.8XXD]   . Chest pain [R07.9] 04/07/2015  . Protein-calorie malnutrition, severe ([place]) [E43] 01/15/2015  . MDD (major depressive disorder), recurrent episode, severe ([place]) [F33.2] 10/16/2014  . Cocaine use disorder, severe, dependence ([place]) [F14.20] 10/10/2014  . Cocaine-induced vascular disorder ([place]) [V01.314, I99.9] 06/19/2013  . Essential hypertension [I10] 02/26/2010    Total Time spent with patient: 45 minutes  Subjective:   [name] is a 56 y.o. female patient admitted with suicidal ideation without a plan .  HPI:  Pt was seen and chart reviewed with treatment team and Dr Mariea Clonts.  Pt presented to the [facility], voluntarily, complaining of suicidal ideation without a plan. Pt is a bilateral amputee and had been seen at [facility] earlier the same day for bilateral leg pain and chronic non-healing wounds to both legs. Pt has a long history of drug abuse. UDS positive for cocaine, THC and opiates, BAL negative. Pt stated she needs wound care and want help with her substance abuse. Today, Pt  denies suicidal/homicidal ideation, denies auditory/visual hallucinations and does not appear to be responding to internal stimuli. Pt will be seen by Peer Support for community resources and support for substance abuse treatment. Pt is waiting to be seen by medical doctor for her wounds. Pt is stable and psychiatrically clear for discharge.  Per Dr Beatriz Chancellor note: Pt's wounds are healing as expected and there is no need for any further intervention.   Past Psychiatric History: As above  Risk to Self: None Risk to Others: None Prior Inpatient Therapy: Prior Inpatient Therapy: No Prior Therapy Dates: None Prior Therapy Facilty/Provider(s): Pt says no Reason for Treatment: Pt says no Prior Outpatient Therapy: Prior Outpatient Therapy: No Prior Therapy Dates: Unknown Prior Therapy Facilty/Provider(s): Unknown Reason for Treatment: Unknown Does patient have an ACCT team?: No Does patient have Intensive In-House Services?  : No Does patient have [facility] services? : No Does patient have P4CC services?: No  Past Medical History:  Past Medical History:  Diagnosis Date  . CAP (community acquired pneumonia) 03/2014 X 2  . Cocaine abuse ([place])    ongoing with resultant vaculitis.  . Depression   . Headache    "weekly" (07/29/2016)  . Hypertension   . Inflammatory arthritis   . Migraines    "probably 5-6/yr" (07/29/2016)  . Normocytic anemia    BL Hgb 9.8-12. Last anemia panel 04/2010 - showing Fe 19, ferritin 101.  Pt on monthly B12 injections  . Rheumatoid arthritis(714.0)    patient reported  . 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 Followed by Dr. Louanne Skye  Past Surgical History:  Procedure Laterality Date  . HERNIA REPAIR     "stomach"  . SKIN BIOPSY Bilateral    shin nodules   Family History:  Family History  Problem Relation Age of Onset  . Breast cancer Mother        Breast cancer  . Alcohol abuse Mother   . Colon  cancer Maternal Aunt 42  . Alcohol abuse Father    Family Psychiatric  History: Unknown Social History:  Social History   Substance and Sexual Activity  Alcohol Use No  . Alcohol/week: 0.0 oz     Social History   Substance and Sexual Activity  Drug Use Yes  . Types: "Crack" cocaine, Cocaine   Comment: Smoked crack 06/02/2017    Social History   Socioeconomic History  . Marital status: Single    Spouse name: None  . Number of children: 0  . Years of education: 11th grade  . Highest education level: None  Social Needs  . Financial resource strain: None  . Food insecurity - worry: None  . Food insecurity - inability: None  . Transportation needs - medical: None  . Transportation needs - non-medical: None  Occupational History  . Occupation: Disability    Comment: since 2011, due to her rheumatoid arthritis  Tobacco Use  . Smoking status: Current Every Day Smoker    Packs/day: 0.12    Years: 38.00    Pack years: 4.56    Types: Cigarettes  . Smokeless tobacco: Never Used  . Tobacco comment: 2 A DAY  Substance and Sexual Activity  . Alcohol use: No    Alcohol/week: 0.0 oz  . Drug use: Yes    Types: "Crack" cocaine, Cocaine    Comment: Smoked crack 06/02/2017  . Sexual activity: Not Currently  Other Topics Concern  . None  Social History Narrative   Unemployed:  cleaning in past   Living at [place]; has Medicaid.   crack/cocaine use; pt denies IVDU   tobacco:  1/2 ppd, trying to quit   alcohol:  none       Additional Social History: N/A    Allergies:   Allergies  Allergen Reactions  . Acetaminophen Swelling and Other (See Comments)    Reaction:  Eyelid swelling  . Other Other (See Comments)    Pt states she is allergic to a blood pressure medication, but does not know the reaction or medication    Labs:  Results for orders placed or performed during the hospital encounter of 08/09/17 (from the past 48 hour(s))  Rapid urine drug screen  (hospital performed)     Status: Abnormal   Collection Time: 08/09/17  7:12 PM  Result Value Ref Range   Opiates POSITIVE (A) NONE DETECTED   Cocaine POSITIVE (A) NONE DETECTED   Benzodiazepines NONE DETECTED NONE DETECTED   Amphetamines NONE DETECTED NONE DETECTED   Tetrahydrocannabinol POSITIVE (A) NONE DETECTED   Barbiturates NONE DETECTED NONE DETECTED    Comment:        DRUG SCREEN FOR MEDICAL PURPOSES ONLY.  IF CONFIRMATION IS NEEDED FOR ANY PURPOSE, NOTIFY LAB WITHIN 5 DAYS.        LOWEST DETECTABLE LIMITS FOR URINE DRUG SCREEN Drug Class       Cutoff (ng/mL) Amphetamine      1000 Barbiturate      200 Benzodiazepine   161 Tricyclics       096 Opiates          300  Cocaine          300 THC              50   Comprehensive metabolic panel     Status: Abnormal   Collection Time: 08/09/17  7:56 PM  Result Value Ref Range   Sodium 141 135 - 145 mmol/L   Potassium 3.2 (L) 3.5 - 5.1 mmol/L   Chloride 106 101 - 111 mmol/L   CO2 25 22 - 32 mmol/L   Glucose, Bld 123 (H) 65 - 99 mg/dL   BUN 15 6 - 20 mg/dL   Creatinine, Ser 0.69 0.44 - 1.00 mg/dL   Calcium 9.1 8.9 - 10.3 mg/dL   Total Protein 6.9 6.5 - 8.1 g/dL   Albumin 3.1 (L) 3.5 - 5.0 g/dL   AST 16 15 - 41 U/L   ALT 8 (L) 14 - 54 U/L   Alkaline Phosphatase 70 38 - 126 U/L   Total Bilirubin 0.2 (L) 0.3 - 1.2 mg/dL   GFR calc non Af Amer >60 >60 mL/min   GFR calc Af Amer >60 >60 mL/min    Comment: (NOTE) The eGFR has been calculated using the CKD EPI equation. This calculation has not been validated in all clinical situations. eGFR's persistently <60 mL/min signify possible Chronic Kidney Disease.    Anion gap 10 5 - 15  Ethanol     Status: None   Collection Time: 08/09/17  7:56 PM  Result Value Ref Range   Alcohol, Ethyl (B) <10 <10 mg/dL    Comment:        LOWEST DETECTABLE LIMIT FOR SERUM ALCOHOL IS 10 mg/dL FOR MEDICAL PURPOSES ONLY   Salicylate level     Status: None   Collection Time: 08/09/17  7:56 PM   Result Value Ref Range   Salicylate Lvl <3.3 2.8 - 30.0 mg/dL  Acetaminophen level     Status: Abnormal   Collection Time: 08/09/17  7:56 PM  Result Value Ref Range   Acetaminophen (Tylenol), Serum <10 (L) 10 - 30 ug/mL    Comment:        THERAPEUTIC CONCENTRATIONS VARY SIGNIFICANTLY. A RANGE OF 10-30 ug/mL MAY BE AN EFFECTIVE CONCENTRATION FOR MANY PATIENTS. HOWEVER, SOME ARE BEST TREATED AT CONCENTRATIONS OUTSIDE THIS RANGE. ACETAMINOPHEN CONCENTRATIONS >150 ug/mL AT 4 HOURS AFTER INGESTION AND >50 ug/mL AT 12 HOURS AFTER INGESTION ARE OFTEN ASSOCIATED WITH TOXIC REACTIONS.   cbc     Status: Abnormal   Collection Time: 08/09/17  7:56 PM  Result Value Ref Range   WBC 6.6 4.0 - 10.5 K/uL   RBC 3.71 (L) 3.87 - 5.11 MIL/uL   Hemoglobin 10.3 (L) 12.0 - 15.0 g/dL   HCT 31.0 (L) 36.0 - 46.0 %   MCV 83.6 78.0 - 100.0 fL   MCH 27.8 26.0 - 34.0 pg   MCHC 33.2 30.0 - 36.0 g/dL   RDW 14.9 11.5 - 15.5 %   Platelets 394 150 - 400 K/uL    Current Facility-Administered Medications  Medication Dose Route Frequency Provider Last Rate Last Dose  . alum & mag hydroxide-simeth (MAALOX/MYLANTA) 200-200-20 MG/5ML suspension 30 mL  30 mL Oral Q6H PRN Street, [place], [place]      . ibuprofen (ADVIL,MOTRIN) tablet 600 mg  600 mg Oral Q8H PRN Street, [place], [place]   600 mg at 08/10/17 8250  . nicotine (NICODERM CQ - dosed in mg/24 hours) patch 21 mg  21 mg Transdermal Daily [address] [place], [place]-C   21 mg  at 08/09/17 2151  . ondansetron (ZOFRAN) tablet 4 mg  4 mg Oral Q8H PRN Street, [place], [place]      . traMADol Veatrice Bourbon) tablet 100 mg  100 mg Oral Q12H PRN Street, [place], [place]   100 mg at 08/10/17 6606  . zolpidem (AMBIEN) tablet 5 mg  5 mg Oral QHS PRN Street, [place], [place]       Current Outpatient Medications  Medication Sig Dispense Refill  . busPIRone (BUSPAR) 15 MG tablet Take 1 tablet (15 mg total) by mouth 3 (three) times daily. (Patient not taking: Reported on 08/09/2017) 90 tablet  0  . cyclobenzaprine (FLEXERIL) 5 MG tablet Take 1 tablet (5 mg total) by mouth 3 (three) times daily as needed (muscle soreness). (Patient not taking: Reported on 08/09/2017) 30 tablet 0  . escitalopram (LEXAPRO) 10 MG tablet Take 2 tablets (20 mg total) by mouth at bedtime. (Patient not taking: Reported on 08/09/2017) 60 tablet 4  . gabapentin (NEURONTIN) 600 MG tablet Take 1 tablet (600 mg total) by mouth 3 (three) times daily. (Patient not taking: Reported on 08/09/2017) 60 tablet 3  . naproxen (NAPROSYN) 250 MG tablet Take 1 tablet (250 mg total) by mouth 2 (two) times daily. (Patient not taking: Reported on 08/09/2017) 20 tablet 0  . sulfamethoxazole-trimethoprim (BACTRIM DS,SEPTRA DS) 800-160 MG tablet Take 1 tablet by mouth 2 (two) times daily. (Patient not taking: Reported on 08/09/2017) 20 tablet 0  . traMADol (ULTRAM) 50 MG tablet Take 2 tablets (100 mg total) by mouth every 12 (twelve) hours as needed for severe pain. 15 tablet 0    Musculoskeletal: Strength & Muscle Tone: within normal limits Gait & Station: normal Patient leans: N/A  Psychiatric Specialty Exam: Physical Exam  Constitutional: She is oriented to person, place, and time. She appears well-developed and well-nourished.  Respiratory: Effort normal.  Neurological: She is alert and oriented to person, place, and time.  Psychiatric: Her speech is normal and behavior is normal. Thought content normal. Her mood appears anxious. Cognition and memory are normal. She expresses impulsivity. She exhibits a depressed mood.    Review of Systems  Psychiatric/Behavioral: Positive for depression and substance abuse. Negative for hallucinations, memory loss and suicidal ideas. The patient is nervous/anxious. The patient does not have insomnia.   All other systems reviewed and are negative.   Blood pressure (!) 176/83, pulse 93, temperature 98.1 F (36.7 C), temperature source Oral, resp. rate 18, SpO2 94 %.There is no height or weight  on file to calculate BMI.  General Appearance: Casual  Eye Contact:  Good  Speech:  Clear and Coherent and Normal Rate  Volume:  Normal  Mood:  Anxious and Depressed  Affect:  Congruent and Depressed  Thought Process:  Coherent, Goal Directed and Linear  Orientation:  Full (Time, Place, and Person)  Thought Content:  Logical  Suicidal Thoughts:  No  Homicidal Thoughts:  No  Memory:  Immediate;   Good Recent;   Good Remote;   Fair  Judgement:  Good  Insight:  Fair  Psychomotor Activity:  Normal  Concentration:  Concentration: Good and Attention Span: Good  Recall:  Good  Fund of Knowledge:  Good  Language:  Good  Akathisia:  No  Handed:  Right  AIMS (if indicated):   N/A  Assets:  Agricultural consultant Housing Social Support  ADL's:  Intact  Cognition:  WNL  Sleep:   Okay     Treatment Plan Summary: Plan Cocaine-induced mood disorder with depressive  symptoms [facility])  Discharge Home  Follow up with Peer Support in the community for substance abuse treatment Peer Support is working with pt for possible admission to [facility] for substance abuse treatment.  Avoid the use of alcohol and illicit drugs Take all medications as prescribed  Disposition: No evidence of imminent risk to self or others at present.   Patient does not meet criteria for psychiatric inpatient admission. Supportive therapy provided about ongoing stressors. Discussed crisis plan, support from social network, calling 911, coming to the Emergency Department, and calling Suicide Hotline.  Ethelene Hal, NP 08/10/2017 12:53 PM   Patient seen face-to-face for psychiatric evaluation, chart reviewed and case discussed with the physician extender and developed treatment plan. Reviewed the information documented and agree with the treatment plan.  Buford Dresser, DO

## 2017-08-11 MED ORDER — POTASSIUM CHLORIDE ER 10 MEQ PO TBCR: 10.0000 meq | EXTENDED_RELEASE_TABLET | Freq: Every day | ORAL | 0 refills | Status: DC

## 2017-08-11 MED ORDER — POTASSIUM CHLORIDE CRYS ER 20 MEQ PO TBCR: 20.0000 meq | EXTENDED_RELEASE_TABLET | Freq: Every day | ORAL | Status: DC

## 2017-08-11 MED ORDER — HYDROCHLOROTHIAZIDE 25 MG PO TABS
25.0000 mg | ORAL_TABLET | Freq: Every day | ORAL | Status: DC
Start: 2017-08-11 — End: 2017-08-11
  Administered 2017-08-11: 25 mg via ORAL
  Filled 2017-08-11: qty 1

## 2017-08-11 MED ORDER — HYDROCHLOROTHIAZIDE 25 MG PO TABS: 25.0000 mg | ORAL_TABLET | Freq: Every day | ORAL | 1 refills | Status: DC

## 2017-08-11 NOTE — ED Notes (Signed)
Patient slept on and off during the night. Calm and cooperative without disturbance. Denies SI.

## 2017-08-11 NOTE — Discharge Instructions (Signed)
Take the medications for blood pressure and potassium.  Follow up with your primary care doctor to recheck you blood pressure

## 2017-08-11 NOTE — ED Provider Notes (Signed)
Patient is persistently hypertensive.  HCTZ 25 mg with daily potassium ordered   Dorie Rank, MD 08/11/17 0700

## 2017-08-11 NOTE — ED Notes (Signed)
Per Erasmo Downer, RN patient is to be picked up by brother after work at 2300.

## 2017-08-11 NOTE — Progress Notes (Signed)
CSW spoke with pt's brother [name] via phone [phone]. He states he will be coming to pick pt up from ED this morning. "Thought she had gotten another ride home." Updated TCU.  Sharren Bridge, MSW, LCSW Clinical Social Work 08/11/2017 Coverage for [phone]

## 2017-08-11 NOTE — ED Notes (Signed)
Patient's brother has not come to pick up patient. Attempted to call brother but no answer. Voicemail left within HIPPA regulation.

## 2017-08-12 ENCOUNTER — Other Ambulatory Visit: Payer: Self-pay

## 2017-08-12 ENCOUNTER — Emergency Department (HOSPITAL_COMMUNITY)
Admission: EM | Admit: 2017-08-12 | Discharge: 2017-08-13 | Disposition: A | Discharge status: Home / Self Care | Payer: Medicaid Other | Attending: Emergency Medicine | Admitting: Emergency Medicine

## 2017-08-12 DIAGNOSIS — F333 Major depressive disorder, recurrent, severe with psychotic symptoms: Secondary | ICD-10-CM | POA: Diagnosis present

## 2017-08-12 DIAGNOSIS — I10 Essential (primary) hypertension: Secondary | ICD-10-CM | POA: Insufficient documentation

## 2017-08-12 DIAGNOSIS — Z79899 Other long term (current) drug therapy: Secondary | ICD-10-CM | POA: Insufficient documentation

## 2017-08-12 DIAGNOSIS — R45851 Suicidal ideations: Secondary | ICD-10-CM | POA: Diagnosis not present

## 2017-08-12 DIAGNOSIS — M79605 Pain in left leg: Secondary | ICD-10-CM | POA: Diagnosis not present

## 2017-08-12 DIAGNOSIS — F32A Depression, unspecified: Secondary | ICD-10-CM

## 2017-08-12 DIAGNOSIS — F329 Major depressive disorder, single episode, unspecified: Secondary | ICD-10-CM

## 2017-08-12 DIAGNOSIS — F17210 Nicotine dependence, cigarettes, uncomplicated: Secondary | ICD-10-CM | POA: Diagnosis not present

## 2017-08-12 LAB — CBC
HEMATOCRIT: 35.5 % — AB (ref 36.0–46.0)
Hemoglobin: 12.3 g/dL (ref 12.0–15.0)
MCH: 27.5 pg (ref 26.0–34.0)
MCHC: 34.6 g/dL (ref 30.0–36.0)
MCV: 79.4 fL (ref 78.0–100.0)
Platelets: 536 10*3/uL — ABNORMAL HIGH (ref 150–400)
RBC: 4.47 MIL/uL (ref 3.87–5.11)
RDW: 13.8 % (ref 11.5–15.5)
WBC: 8.6 10*3/uL (ref 4.0–10.5)

## 2017-08-12 LAB — COMPREHENSIVE METABOLIC PANEL
ALBUMIN: 3.3 g/dL — AB (ref 3.5–5.0)
ALK PHOS: 80 U/L (ref 38–126)
ALT: 9 U/L — ABNORMAL LOW (ref 14–54)
ANION GAP: 14 (ref 5–15)
AST: 15 U/L (ref 15–41)
BILIRUBIN TOTAL: 0.8 mg/dL (ref 0.3–1.2)
BUN: 24 mg/dL — AB (ref 6–20)
CALCIUM: 9.8 mg/dL (ref 8.9–10.3)
CO2: 23 mmol/L (ref 22–32)
Chloride: 96 mmol/L — ABNORMAL LOW (ref 101–111)
Creatinine, Ser: 0.82 mg/dL (ref 0.44–1.00)
GFR calc Af Amer: >60 mL/min (ref >60)
GLUCOSE: 101 mg/dL — AB (ref 65–99)
Potassium: 4.2 mmol/L (ref 3.5–5.1)
Sodium: 133 mmol/L — ABNORMAL LOW (ref 135–145)
TOTAL PROTEIN: 7.6 g/dL (ref 6.5–8.1)

## 2017-08-12 LAB — ACETAMINOPHEN LEVEL

## 2017-08-12 LAB — SALICYLATE LEVEL: Salicylate Lvl: <7 mg/dL (ref 2.8–30.0)

## 2017-08-12 LAB — ETHANOL: Alcohol, Ethyl (B): <10 mg/dL (ref <10)

## 2017-08-12 MED ORDER — KETOROLAC TROMETHAMINE 60 MG/2ML IM SOLN
15.0000 mg | Freq: Once | INTRAMUSCULAR | Status: AC
  Administered 2017-08-12: 15 mg via INTRAMUSCULAR
  Filled 2017-08-12: qty 2

## 2017-08-12 MED ORDER — OXYCODONE HCL 5 MG PO TABS
5.0000 mg | ORAL_TABLET | Freq: Once | ORAL | Status: AC
  Administered 2017-08-12: 5 mg via ORAL
  Filled 2017-08-12: qty 1

## 2017-08-12 MED ORDER — DIAZEPAM 5 MG PO TABS
5.0000 mg | ORAL_TABLET | Freq: Once | ORAL | Status: AC
  Administered 2017-08-12: 5 mg via ORAL
  Filled 2017-08-12: qty 1

## 2017-08-12 NOTE — ED Provider Notes (Signed)
[facility] DEPT Provider Note   CSN: [id number] Arrival date & time: 08/12/17  1945     History   Chief Complaint Chief Complaint  Patient presents with  . Leg Pain  . Suicidal    HPI [name] is a 56 y.o. female.  56 yo F with a chief complaint of bilateral leg pain.  The patient has had stomach pain for quite some time.  She thinks many many months.  Is currently making her suicidal.  The patient has no active plan for suicide.  She states the reason why that has come off is because of the amount of pain that she has.  She denies fevers or chills denies nausea or vomiting.  Denies recent injury.  She is taking tramadol at home for pain.  Does not feel it is improving.  She was seen here couple days ago for the same.   The history is provided by the patient.  Leg Pain   This is a chronic problem. The current episode started more than 1 week ago. The problem occurs constantly. The problem has not changed since onset.The pain is present in the left lower leg and right lower leg. The quality of the pain is described as constant. The pain is at a severity of 10/10. The pain is severe. The symptoms are aggravated by contact. She has tried nothing for the symptoms. The treatment provided no relief.    Past Medical History:  Diagnosis Date  . CAP (community acquired pneumonia) 03/2014 X 2  . Cocaine abuse ([place])    ongoing with resultant vaculitis.  . Depression   . Headache    "weekly" (07/29/2016)  . Hypertension   . Inflammatory arthritis   . Migraines    "probably 5-6/yr" (07/29/2016)  . Normocytic anemia    BL Hgb 9.8-12. Last anemia panel 04/2010 - showing Fe 19, ferritin 101.  Pt on monthly B12 injections  . Rheumatoid arthritis(714.0)    patient reported  . 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 Followed by Dr. Louanne Skye    Patient Active Problem List   Diagnosis Date Noted  .  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 Hypertensive crisis   . Phantom limb pain ([place])   . S/P bilateral below knee amputation ([place]) 04/13/2017  . Tobacco abuse   . Post-operative pain   . Acute blood loss anemia   . 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 Wound infection 03/30/2017  . AKI (acute kidney injury) ([place]) 02/07/2017  . Acute kidney injury ([place]) 02/06/2017  . Wound healing, delayed   . Chest pain 04/07/2015  . Protein-calorie malnutrition, severe ([place]) 01/15/2015  . MDD (major depressive disorder), recurrent episode, severe ([name]) 10/16/2014  . Cocaine use disorder, severe, dependence ([place]) 10/10/2014  . 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 Essential hypertension 02/26/2010    Past Surgical History:  Procedure Laterality Date  . HERNIA REPAIR     "stomach"  . SKIN BIOPSY Bilateral    shin nodules    OB History    No data available       Home Medications    Prior to Admission medications   Medication Sig Start Date End Date Taking? Authorizing Provider  hydrochlorothiazide (HYDRODIURIL) 25 MG tablet Take 1 tablet (25 mg total) daily by mouth. 08/11/17  Yes Dorie Rank, MD  potassium chloride (  K-DUR) 10 MEQ tablet Take 1 tablet (10 mEq total) daily by mouth. 08/11/17  Yes Dorie Rank, MD  busPIRone (BUSPAR) 15 MG tablet Take 1 tablet (15 mg total) by mouth 3 (three) times daily. Patient not taking: Reported on 08/09/2017 07/26/17   Scot Jun, FNP  cyclobenzaprine (FLEXERIL) 5 MG tablet Take 1 tablet (5 mg total) by mouth 3 (three) times daily as needed (muscle soreness). Patient not taking: Reported on 08/09/2017 07/04/17   Rolland Porter, MD  escitalopram (LEXAPRO) 10 MG tablet Take 2 tablets (20 mg total) by mouth at bedtime. Patient not taking: Reported on 08/09/2017 07/26/17   Scot Jun, FNP  gabapentin (NEURONTIN) 600 MG tablet Take 1 tablet (600 mg total)  by mouth 3 (three) times daily. Patient not taking: Reported on 08/09/2017 07/26/17   Scot Jun, FNP  naproxen (NAPROSYN) 250 MG tablet Take 1 tablet (250 mg total) by mouth 2 (two) times daily. Patient not taking: Reported on 08/09/2017 07/04/17   Rolland Porter, MD  sulfamethoxazole-trimethoprim (BACTRIM DS,SEPTRA DS) 800-160 MG tablet Take 1 tablet by mouth 2 (two) times daily. Patient not taking: Reported on 08/09/2017 08/03/17   Scot Jun, FNP  traMADol (ULTRAM) 50 MG tablet Take 2 tablets (100 mg total) by mouth every 12 (twelve) hours as needed for severe pain. Patient not taking: Reported on 08/12/2017 07/26/17   Scot Jun, FNP    Family History Family History  Problem Relation Age of Onset  . Breast cancer Mother        Breast cancer  . Alcohol abuse Mother   . Colon cancer Maternal Aunt 49  . Alcohol abuse Father     Social History Social History   Tobacco Use  . Smoking status: Current Every Day Smoker    Packs/day: 0.12    Years: 38.00    Pack years: 4.56    Types: Cigarettes  . Smokeless tobacco: Never Used  . Tobacco comment: 2 A DAY  Substance Use Topics  . Alcohol use: No    Alcohol/week: 0.0 oz  . Drug use: Yes    Types: "Crack" cocaine, Cocaine    Comment: Smoked crack 06/02/2017     Allergies   Acetaminophen and Other   Review of Systems Review of Systems  Constitutional: Negative for chills and fever.  HENT: Negative for congestion and rhinorrhea.   Eyes: Negative for redness and visual disturbance.  Respiratory: Negative for shortness of breath and wheezing.   Cardiovascular: Negative for chest pain and palpitations.  Gastrointestinal: Negative for nausea and vomiting.  Genitourinary: Negative for dysuria and urgency.  Musculoskeletal: Negative for arthralgias and myalgias.  Skin: Negative for pallor and wound.  Neurological: Negative for dizziness and headaches.     Physical Exam Updated Vital Signs BP 106/89 (BP  Location: Right Arm)   Pulse (!) 110   Temp 98 F (36.7 C) (Oral)   Ht 5' 1.5" (1.562 m)   Wt 43.5 kg (96 lb)   SpO2 99%   BMI 17.85 kg/m   Physical Exam  Constitutional: She is oriented to person, place, and time. She appears well-developed and well-nourished. No distress.  HENT:  Head: Normocephalic and atraumatic.  Eyes: EOM are normal. Pupils are equal, round, and reactive to light.  Neck: Normal range of motion. Neck supple.  Cardiovascular: Normal rate and regular rhythm. Exam reveals no gallop and no friction rub.  No murmur heard. Pulmonary/Chest: Effort normal. She has no wheezes. She has no rales.  Abdominal: Soft. She exhibits no distension. There is no tenderness.  Musculoskeletal: She exhibits no edema or tenderness.  Granulation tissue to left BKA.  Some mild serous drainage.  No surrounding erythema no fluctuance.   Neurological: She is alert and oriented to person, place, and time.  Skin: Skin is warm and dry. She is not diaphoretic.  Psychiatric: She has a normal mood and affect. Her behavior is normal.  Nursing note and vitals reviewed.    ED Treatments / Results  Labs (all labs ordered are listed, but only abnormal results are displayed) Labs Reviewed  COMPREHENSIVE METABOLIC PANEL - Abnormal; Notable for the following components:      Result Value   Sodium 133 (*)    Chloride 96 (*)    Glucose, Bld 101 (*)    BUN 24 (*)    Albumin 3.3 (*)    ALT 9 (*)    All other components within normal limits  ACETAMINOPHEN LEVEL - Abnormal; Notable for the following components:   Acetaminophen (Tylenol), Serum <10 (*)    All other components within normal limits  CBC - Abnormal; Notable for the following components:   HCT 35.5 (*)    Platelets 536 (*)    All other components within normal limits  ETHANOL  SALICYLATE LEVEL  RAPID URINE DRUG SCREEN, HOSP PERFORMED    EKG  EKG Interpretation None       Radiology No results  found.  Procedures Procedures (including critical care time)  Medications Ordered in ED Medications  ketorolac (TORADOL) injection 15 mg (15 mg Intramuscular Given 08/12/17 2213)  oxyCODONE (Oxy IR/ROXICODONE) immediate release tablet 5 mg (5 mg Oral Given 08/12/17 2214)  diazepam (VALIUM) tablet 5 mg (5 mg Oral Given 08/12/17 2214)     Initial Impression / Assessment and Plan / ED Course  I have reviewed the triage vital signs and the nursing notes.  Pertinent labs & imaging results that were available during my care of the patient were reviewed by me and considered in my medical decision making (see chart for details).     56 yo F with a chief complaint of stump pain.  This is a chronic issue for her.  She is on tramadol at home.  I will give her a dose of pain medicine here.  She also is stating that she is suicidal.  I discussed limitation of evaluation in the emergency department but she is somewhat insistent.  We will have them evaluate her.  I feel that she is medically clear.   11:23 PM:  I have discussed the diagnosis/risks/treatment options with the patient and family and believe the pt to be eligible for discharge home to follow-up with PCP. We also discussed returning to the ED immediately if new or worsening sx occur. We discussed the sx which are most concerning (e.g., sudden worsening pain, fever, inability to tolerate by mouth) that necessitate immediate return. Medications administered to the patient during their visit and any new prescriptions provided to the patient are listed below.  Medications given during this visit Medications  ketorolac (TORADOL) injection 15 mg (15 mg Intramuscular Given 08/12/17 2213)  oxyCODONE (Oxy IR/ROXICODONE) immediate release tablet 5 mg (5 mg Oral Given 08/12/17 2214)  diazepam (VALIUM) tablet 5 mg (5 mg Oral Given 08/12/17 2214)     The patient appears reasonably screen and/or stabilized for discharge and I doubt any other medical  condition or other [facility] requiring further screening, evaluation, or treatment in the ED at  this time prior to discharge.    Final Clinical Impressions(s) / ED Diagnoses   Final diagnoses:  Suicidal ideation    ED Discharge Orders    None       Deno Etienne, DO 08/12/17 2323

## 2017-08-12 NOTE — ED Notes (Signed)
Bed: XN54 Expected date:  Expected time:  Means of arrival:  Comments: 56 yo F  bilat knee amp  Pain at surgical site, SI

## 2017-08-12 NOTE — ED Triage Notes (Signed)
Pt BIB [facility] from home. Pt is c/o pain at her amputation site BKA bilaterally. States pain is 10/10. Surgery was 2 months ago. The incision does not show any signs of infection according to EMS. Pt is currently living in a home with no bed. She sleeps on a hardwood floor with a sheet. She has had some suicidal ideations in the past. Denies SI/HI today. Pt is CAOx4.

## 2017-08-13 ENCOUNTER — Encounter (HOSPITAL_COMMUNITY): Payer: Self-pay | Admitting: Emergency Medicine

## 2017-08-13 ENCOUNTER — Emergency Department (HOSPITAL_COMMUNITY)
Admission: EM | Admit: 2017-08-13 | Discharge: 2017-08-13 | Disposition: A | Discharge status: Home / Self Care | Payer: Medicaid Other | Source: Home / Self Care | Attending: Emergency Medicine | Admitting: Emergency Medicine

## 2017-08-13 DIAGNOSIS — Z659 Problem related to unspecified psychosocial circumstances: Secondary | ICD-10-CM

## 2017-08-13 NOTE — ED Notes (Signed)
Gave [employer] off duty officer patient name, address, and brother's name and phone number to do wellness check to see about getting patient home.  Awaiting for further information.

## 2017-08-13 NOTE — ED Notes (Signed)
[facility] called for pt transport.
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
## 2017-08-13 NOTE — ED Provider Notes (Signed)
TTS advised outpatient treatment.  I spoke with pt.  She reports she is safe at home.  Pt denies thoughts of harming herself.  Pt reports she will not harm anyone else.   Pt will return if any problems.   Fransico Meadow, PA-C 65/20/76 [id number]    Delora Fuel, MD 50/27/14 [phone]

## 2017-08-13 NOTE — ED Provider Notes (Signed)
[facility] DEPT Provider Note   CSN: [id number] Arrival date & time: 08/13/17  0444     History   Chief Complaint Chief Complaint  Patient presents with  . Can't get into house    HPI BRINLYNN GORTON is a 56 y.o. female.  The history is provided by the patient and medical records. No language interpreter was used.   YOCELINE BAZAR is a 56 y.o. female  with a PMH as listed below who presents to the Emergency Department because she could not get into her house.  She was seen yesterday for bilateral leg pain and suicidal thoughts.  She was cleared by psychiatry and was discharged last night and taken home by [facility]. PTAR reports that when they got to her home, she could not get inside.  She did not have a key and all the doors were locked.  Her brother's vehicle was in the driveway, but after multiple attempts at knocking on several doors, no one answered. They called his phone, but it went straight to voicemail.  Because they could not bring her home, they brought her back to the emergency department.  She has no other complaints. She denies suicidal or homicidal thoughts. She made multiple attempts at getting in touch with family while I was at bedside with no success.    Past Medical History:  Diagnosis Date  . CAP (community acquired pneumonia) 03/2014 X 2  . Cocaine abuse ([place])    ongoing with resultant vaculitis.  . Depression   . Headache    "weekly" (07/29/2016)  . Hypertension   . Inflammatory arthritis   . Migraines    "probably 5-6/yr" (07/29/2016)  . Normocytic anemia    BL Hgb 9.8-12. Last anemia panel 04/2010 - showing Fe 19, ferritin 101.  Pt on monthly B12 injections  . Rheumatoid arthritis(714.0)    patient reported  . 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 Followed by Dr. Louanne Skye    Patient Active Problem List   Diagnosis Date Noted  . 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 Hypertensive crisis   . Phantom limb pain ([place])   . S/P bilateral below knee amputation ([place]) 04/13/2017  . Tobacco abuse   . Post-operative pain   . Acute blood loss anemia   . 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 Wound infection 03/30/2017  . AKI (acute kidney injury) ([place]) 02/07/2017  . Acute kidney injury ([place]) 02/06/2017  . Wound healing, delayed   . Chest pain 04/07/2015  . Protein-calorie malnutrition, severe ([place]) 01/15/2015  . MDD (major depressive disorder), recurrent episode, severe ([place]) 10/16/2014  . Cocaine use disorder, severe, dependence ([place]) 10/10/2014  . 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 Essential hypertension 02/26/2010    Past Surgical History:  Procedure Laterality Date  . HERNIA REPAIR     "stomach"  . SKIN BIOPSY Bilateral    shin nodules    OB History    No data available       Home Medications    Prior to Admission medications   Medication Sig Start Date End Date Taking? Authorizing Provider  hydrochlorothiazide (HYDRODIURIL) 25 MG tablet Take 1 tablet (25 mg total) daily by mouth. 08/11/17  Yes Dorie Rank, MD  potassium chloride (K-DUR) 10 MEQ tablet Take 1 tablet (10 mEq total) daily by mouth. 08/11/17  Yes Dorie Rank,  MD  busPIRone (BUSPAR) 15 MG tablet Take 1 tablet (15 mg total) by mouth 3 (three) times daily. Patient not taking: Reported on 08/09/2017 07/26/17   Scot Jun, FNP  cyclobenzaprine (FLEXERIL) 5 MG tablet Take 1 tablet (5 mg total) by mouth 3 (three) times daily as needed (muscle soreness). Patient not taking: Reported on 08/09/2017 07/04/17   Rolland Porter, MD  escitalopram (LEXAPRO) 10 MG tablet Take 2 tablets (20 mg total) by mouth at bedtime. Patient not taking: Reported on 08/09/2017 07/26/17   Scot Jun, FNP  gabapentin (NEURONTIN) 600 MG tablet Take 1 tablet (600 mg total) by mouth 3 (three) times  daily. Patient not taking: Reported on 08/09/2017 07/26/17   Scot Jun, FNP  naproxen (NAPROSYN) 250 MG tablet Take 1 tablet (250 mg total) by mouth 2 (two) times daily. Patient not taking: Reported on 08/09/2017 07/04/17   Rolland Porter, MD  sulfamethoxazole-trimethoprim (BACTRIM DS,SEPTRA DS) 800-160 MG tablet Take 1 tablet by mouth 2 (two) times daily. Patient not taking: Reported on 08/09/2017 08/03/17   Scot Jun, FNP  traMADol (ULTRAM) 50 MG tablet Take 2 tablets (100 mg total) by mouth every 12 (twelve) hours as needed for severe pain. Patient not taking: Reported on 08/12/2017 07/26/17   Scot Jun, FNP    Family History Family History  Problem Relation Age of Onset  . Breast cancer Mother        Breast cancer  . Alcohol abuse Mother   . Colon cancer Maternal Aunt 50  . Alcohol abuse Father     Social History Social History   Tobacco Use  . Smoking status: Current Every Day Smoker    Packs/day: 0.12    Years: 38.00    Pack years: 4.56    Types: Cigarettes  . Smokeless tobacco: Never Used  . Tobacco comment: 2 A DAY  Substance Use Topics  . Alcohol use: No    Alcohol/week: 0.0 oz  . Drug use: Yes    Types: "Crack" cocaine, Cocaine    Comment: Smoked crack 06/02/2017     Allergies   Acetaminophen and Other   Review of Systems Review of Systems  Psychiatric/Behavioral: Negative for hallucinations and suicidal ideas.     Physical Exam Updated Vital Signs BP (!) 157/87 (BP Location: Left Arm)   Pulse (!) 110   Temp 98.5 F (36.9 C) (Oral)   Resp 16   SpO2 99%   Physical Exam  Constitutional: She appears well-developed and well-nourished. No distress.  HENT:  Head: Normocephalic and atraumatic.  Neck: Neck supple.  Cardiovascular: Normal rate, regular rhythm and normal heart sounds.  No murmur heard. Pulmonary/Chest: Effort normal and breath sounds normal. No respiratory distress. She has no wheezes. She has no rales.   Neurological: She is alert.  Skin: Skin is warm and dry.  Nursing note and vitals reviewed.    ED Treatments / Results  Labs (all labs ordered are listed, but only abnormal results are displayed) Labs Reviewed - No data to display  EKG  EKG Interpretation None       Radiology No results found.  Procedures Procedures (including critical care time)  Medications Ordered in ED Medications - No data to display   Initial Impression / Assessment and Plan / ED Course  I have reviewed the triage vital signs and the nursing notes.  Pertinent labs & imaging results that were available during my care of the patient were reviewed by me and considered in  my medical decision making (see chart for details).    FRITZI SCRIPTER is a 56 y.o. female who presents to ED by [facility] after just being discharged. Per PTAR, they went to bring her home, but doors were all locked and she had no key. Her brother's car was in the driveway, but he did not answer the door or his phone when called. They then brought patient back to ER. She has no complaints at this time. Multiple phone calls made to brother, but no one was answering the phone. I spoke with [employer] who went to the home to perform wellness check and brother was home. He was notified that his sister whom lives with her was being discharged and agreed to stay home until patient arrived. PTAR notified and patient discharged in stable condition.     Final Clinical Impressions(s) / ED Diagnoses   Final diagnoses:  Social problem    ED Discharge Orders    None       Leyan Branden, Ozella Almond, [place] 08/13/17 1313    Duffy Bruce, MD 08/14/17 1347

## 2017-08-13 NOTE — ED Triage Notes (Addendum)
Pt discharged at [facility] and brought home by [facility]. Patient unable to get into home. Patient thought she had her keys and does not. PTAR attempted to knock on the door, call house and rang the door bell with no answer. Brother's car is in drive way but there was no answer on the door. Patient medically cleared per last visit.

## 2017-08-13 NOTE — ED Notes (Signed)
This RN tried calling patient's brother, [name] at [phone], again to see if he would be home or able to have someone unlock door at house, but no answer, LMVM.

## 2017-08-13 NOTE — BHH Counselor (Signed)
Clinician asked the pt if her brother would pick her up from the hospital? Pt reported, EMS (PTAR) takes her home. Pt reported, her wounds are bleeding. Clinician observed blood on her sheets. Clinician noted the pt has her keys. Clinician updated Colletta Maryland, RN and Clarise Cruz, RN.   Vertell Novak, MS, [facility], [facility] Triage Specialist [phone]

## 2017-08-13 NOTE — ED Notes (Signed)
PATIENT PLACED ON BED PAN SO SHE COULD USE THE BATHROOM

## 2017-08-13 NOTE — Progress Notes (Signed)
CSW consulted because patient is unable to get into her home. Per chart review, patient was discharged home via PTAR this am and no one answered the door and she did not have her key to get into the home. Per chart review, patient's RN has called brother and has not been successful.   CSW contacted patient's brother [name] [phone]) and informed him that patient is ready for discharge and has been unable to get into home. Patient's brother reported that he is home and that he will be at the home to receive patient.   CSW informed patient's RN that patient's brother is home and that she can call transport for patient. CSW signing off, no other needs identified.

## 2017-08-13 NOTE — ED Notes (Signed)
Patient reports that she tried calling her brother who didn't answer.

## 2017-08-13 NOTE — ED Notes (Signed)
Bed: WHALB Expected date:  Expected time:  Means of arrival:  Comments: 

## 2017-08-13 NOTE — ED Notes (Signed)
Patient moved to hallway bed and plugged her cell phone up to charge as she reports is dead and unable to call her brother.   This RN attempted to call [name] at [phone] again but no answer.

## 2017-08-13 NOTE — ED Notes (Signed)
Called [name], listed as aide in patient's chart at [phone] and had to LM on VM.  Called patient's Brother, [name], at [phone] as listed in demographics and not working.  Found another number for [name], [phone], in social worker's note from couple days ago, called and had to LM.

## 2017-08-13 NOTE — ED Notes (Signed)
Bed: IX78 Expected date:  Expected time:  Means of arrival:  Comments:

## 2017-08-13 NOTE — Discharge Instructions (Signed)
Follow up with your Physicians as scheduled

## 2017-08-14 ENCOUNTER — Other Ambulatory Visit: Payer: Self-pay

## 2017-08-14 ENCOUNTER — Encounter (HOSPITAL_COMMUNITY): Payer: Self-pay | Admitting: Emergency Medicine

## 2017-08-14 ENCOUNTER — Emergency Department (HOSPITAL_COMMUNITY)
Admission: EM | Admit: 2017-08-14 | Discharge: 2017-08-17 | Disposition: A | Discharge status: Home / Self Care | Payer: Medicaid Other | Attending: Emergency Medicine | Admitting: Emergency Medicine

## 2017-08-14 DIAGNOSIS — F17210 Nicotine dependence, cigarettes, uncomplicated: Secondary | ICD-10-CM | POA: Insufficient documentation

## 2017-08-14 DIAGNOSIS — F333 Major depressive disorder, recurrent, severe with psychotic symptoms: Secondary | ICD-10-CM | POA: Insufficient documentation

## 2017-08-14 DIAGNOSIS — R45851 Suicidal ideations: Secondary | ICD-10-CM | POA: Insufficient documentation

## 2017-08-14 DIAGNOSIS — F1494 Cocaine use, unspecified with cocaine-induced mood disorder: Secondary | ICD-10-CM | POA: Diagnosis not present

## 2017-08-14 DIAGNOSIS — G546 Phantom limb syndrome with pain: Secondary | ICD-10-CM | POA: Diagnosis not present

## 2017-08-14 DIAGNOSIS — Z79899 Other long term (current) drug therapy: Secondary | ICD-10-CM | POA: Insufficient documentation

## 2017-08-14 DIAGNOSIS — F329 Major depressive disorder, single episode, unspecified: Secondary | ICD-10-CM | POA: Diagnosis present

## 2017-08-14 DIAGNOSIS — N926 Irregular menstruation, unspecified: Secondary | ICD-10-CM | POA: Insufficient documentation

## 2017-08-14 DIAGNOSIS — I169 Hypertensive crisis, unspecified: Secondary | ICD-10-CM | POA: Diagnosis not present

## 2017-08-14 DIAGNOSIS — I10 Essential (primary) hypertension: Secondary | ICD-10-CM | POA: Diagnosis not present

## 2017-08-14 DIAGNOSIS — F1910 Other psychoactive substance abuse, uncomplicated: Secondary | ICD-10-CM | POA: Insufficient documentation

## 2017-08-14 DIAGNOSIS — F332 Major depressive disorder, recurrent severe without psychotic features: Secondary | ICD-10-CM | POA: Diagnosis not present

## 2017-08-14 DIAGNOSIS — F1414 Cocaine abuse with cocaine-induced mood disorder: Secondary | ICD-10-CM | POA: Diagnosis present

## 2017-08-14 LAB — CBC
HCT: 37.4 % (ref 36.0–46.0)
Hemoglobin: 13.1 g/dL (ref 12.0–15.0)
MCH: 28.3 pg (ref 26.0–34.0)
MCHC: 35 g/dL (ref 30.0–36.0)
MCV: 80.8 fL (ref 78.0–100.0)
Platelets: 557 10*3/uL — ABNORMAL HIGH (ref 150–400)
RBC: 4.63 MIL/uL (ref 3.87–5.11)
RDW: 14.1 % (ref 11.5–15.5)
WBC: 9.7 10*3/uL (ref 4.0–10.5)

## 2017-08-14 LAB — COMPREHENSIVE METABOLIC PANEL
ALT: 9 U/L — ABNORMAL LOW (ref 14–54)
AST: 20 U/L (ref 15–41)
Albumin: 3.9 g/dL (ref 3.5–5.0)
Alkaline Phosphatase: 87 U/L (ref 38–126)
Anion gap: 13 (ref 5–15)
BUN: 31 mg/dL — ABNORMAL HIGH (ref 6–20)
CO2: 24 mmol/L (ref 22–32)
Calcium: 10.1 mg/dL (ref 8.9–10.3)
Chloride: 101 mmol/L (ref 101–111)
Creatinine, Ser: 0.96 mg/dL (ref 0.44–1.00)
GFR calc Af Amer: >60 mL/min (ref >60)
GFR calc non Af Amer: >60 mL/min (ref >60)
Glucose, Bld: 111 mg/dL — ABNORMAL HIGH (ref 65–99)
Potassium: 3.7 mmol/L (ref 3.5–5.1)
Sodium: 138 mmol/L (ref 135–145)
Total Bilirubin: 0.7 mg/dL (ref 0.3–1.2)
Total Protein: 8.5 g/dL — ABNORMAL HIGH (ref 6.5–8.1)

## 2017-08-14 LAB — RAPID URINE DRUG SCREEN, HOSP PERFORMED
Amphetamines: NOT DETECTED
Barbiturates: NOT DETECTED
Benzodiazepines: POSITIVE — AB
Cocaine: POSITIVE — AB
Opiates: NOT DETECTED
Tetrahydrocannabinol: POSITIVE — AB

## 2017-08-14 LAB — ETHANOL: Alcohol, Ethyl (B): <10 mg/dL (ref <10)

## 2017-08-14 LAB — I-STAT BETA HCG BLOOD, ED (MC, WL, AP ONLY): I-stat hCG, quantitative: 8.6 m[IU]/mL — ABNORMAL HIGH (ref <5)

## 2017-08-14 LAB — ACETAMINOPHEN LEVEL: Acetaminophen (Tylenol), Serum: <10 ug/mL — ABNORMAL LOW (ref 10–30)

## 2017-08-14 LAB — HCG, QUANTITATIVE, PREGNANCY: hCG, Beta Chain, Quant, S: 11 m[IU]/mL — ABNORMAL HIGH (ref <5)

## 2017-08-14 LAB — PREGNANCY, URINE: Preg Test, Ur: POSITIVE — AB

## 2017-08-14 LAB — SALICYLATE LEVEL: Salicylate Lvl: <7 mg/dL (ref 2.8–30.0)

## 2017-08-14 MED ORDER — TRAMADOL HCL 50 MG PO TABS
50.0000 mg | ORAL_TABLET | Freq: Once | ORAL | Status: AC
  Administered 2017-08-14: 50 mg via ORAL
  Filled 2017-08-14: qty 1

## 2017-08-14 MED ORDER — LORAZEPAM 1 MG PO TABS
1.0000 mg | ORAL_TABLET | Freq: Once | ORAL | Status: AC
  Administered 2017-08-14: 1 mg via ORAL
  Filled 2017-08-14: qty 1

## 2017-08-14 NOTE — Progress Notes (Addendum)
Pt labs show positive for pregnancy. TTS confirmed pregnancy is false positive. Pt is NOT pregnant.   Lind Covert, MSW, LCSW Therapeutic Triage Specialist  [phone]

## 2017-08-14 NOTE — ED Notes (Signed)
Patient becoming irritable when offered Tramadol, but she did take it as ordered. Pt watching television with no distress noted at this time.

## 2017-08-14 NOTE — ED Notes (Signed)
Patient is currently screaming in her room asking for pain medication. Pt also noted to remove the dressings off of her leg amputations and scratch the wounds. This writer instructed pt not to pick her legs. Pt unable to be redirected.

## 2017-08-14 NOTE — ED Notes (Signed)
Dr. Wilson Singer notified of patient's request for medications. New orders given.

## 2017-08-14 NOTE — ED Notes (Signed)
Patient currently being assessed via tele psych. Pt cooperative with counselor.

## 2017-08-14 NOTE — ED Notes (Signed)
Bed: PQ24 Expected date:  Expected time:  Means of arrival:  Comments: EMS-SI

## 2017-08-14 NOTE — ED Notes (Signed)
Provider in room to assess patient at this time.   

## 2017-08-14 NOTE — ED Provider Notes (Signed)
[facility] DEPT Provider Note   CSN: [id number] Arrival date & time: 08/14/17  2145     History   Chief Complaint Chief Complaint  Patient presents with  . Medical Clearance    SI    HPI [name] is a 56 y.o. female with history of cocaine abuse with resultant vasculitis, hypertension status post bilateral BKA July 2018 presenting with ongoing and worsening suicidal ideation.  She states this is secondary to her chronic pain in her bilateral stumps.  Pain is identical in nature to her usual pain.  Does not radiate.  She states "I am hurting so bad I will kill myself".  She denies specific plan and states "I have been hurting so much I have not had the time to think about a plan but I will do it".  She states that she has tried rubbing her wounds without significant relief.  She endorses mild serosanguineous drainage from the left BKA.  Denies fevers or chills.  Denies any other medical complaints at this time.  Endorses cocaine use, last used yesterday.  Denies alcohol use.  Denies homicidal ideation, or auditory or visual hallucinations.  The history is provided by the patient.    Past Medical History:  Diagnosis Date  . CAP (community acquired pneumonia) 03/2014 X 2  . Cocaine abuse ([place])    ongoing with resultant vaculitis.  . Depression   . Headache    "weekly" (07/29/2016)  . Hypertension   . Inflammatory arthritis   . Migraines    "probably 5-6/yr" (07/29/2016)  . Normocytic anemia    BL Hgb 9.8-12. Last anemia panel 04/2010 - showing Fe 19, ferritin 101.  Pt on monthly B12 injections  . Rheumatoid arthritis(714.0)    patient reported  . 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 Followed by Dr. Louanne Skye    Patient Active Problem List   Diagnosis Date Noted  . Cocaine-induced mood disorder with depressive symptoms ([place]) 08/10/2017  . Hypertensive crisis   . Phantom limb pain ([place])    . S/P bilateral below knee amputation ([place]) 04/13/2017  . Tobacco abuse   . Post-operative pain   . Acute blood loss anemia   . 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 Wound infection 03/30/2017  . AKI (acute kidney injury) ([place]) 02/07/2017  . Acute kidney injury ([place]) 02/06/2017  . Wound healing, delayed   . Chest pain 04/07/2015  . Protein-calorie malnutrition, severe ([place]) 01/15/2015  . MDD (major depressive disorder), recurrent episode, severe ([place]) 10/16/2014  . Cocaine use disorder, severe, dependence ([place]) 10/10/2014  . 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 Essential hypertension 02/26/2010    Past Surgical History:  Procedure Laterality Date  . HERNIA REPAIR     "stomach"  . SKIN BIOPSY Bilateral    shin nodules    OB History    No data available       Home Medications    Prior to Admission medications   Medication Sig Start Date End Date Taking? Authorizing Provider  busPIRone (BUSPAR) 15 MG tablet Take 1 tablet (15 mg total) by mouth 3 (three) times daily. Patient not taking: Reported on 08/09/2017 07/26/17   Scot Jun, FNP  cyclobenzaprine (FLEXERIL) 5 MG tablet Take 1 tablet (5 mg total) by mouth 3 (three) times daily as needed (muscle soreness). Patient not taking: Reported on 08/09/2017 07/04/17  Rolland Porter, MD  escitalopram (LEXAPRO) 10 MG tablet Take 2 tablets (20 mg total) by mouth at bedtime. Patient not taking: Reported on 08/09/2017 07/26/17   Scot Jun, FNP  gabapentin (NEURONTIN) 600 MG tablet Take 1 tablet (600 mg total) by mouth 3 (three) times daily. Patient not taking: Reported on 08/09/2017 07/26/17   Scot Jun, FNP  hydrochlorothiazide (HYDRODIURIL) 25 MG tablet Take 1 tablet (25 mg total) daily by mouth. 08/11/17   Dorie Rank, MD  naproxen (NAPROSYN) 250 MG tablet Take 1 tablet (250 mg total) by mouth 2 (two) times daily. Patient not taking: Reported on  08/09/2017 07/04/17   Rolland Porter, MD  potassium chloride (K-DUR) 10 MEQ tablet Take 1 tablet (10 mEq total) daily by mouth. 08/11/17   Dorie Rank, MD  sulfamethoxazole-trimethoprim (BACTRIM DS,SEPTRA DS) 800-160 MG tablet Take 1 tablet by mouth 2 (two) times daily. Patient not taking: Reported on 08/09/2017 08/03/17   Scot Jun, FNP  traMADol (ULTRAM) 50 MG tablet Take 2 tablets (100 mg total) by mouth every 12 (twelve) hours as needed for severe pain. Patient not taking: Reported on 08/12/2017 07/26/17   Scot Jun, FNP    Family History Family History  Problem Relation Age of Onset  . Breast cancer Mother        Breast cancer  . Alcohol abuse Mother   . Colon cancer Maternal Aunt 109  . Alcohol abuse Father     Social History Social History   Tobacco Use  . Smoking status: Current Every Day Smoker    Packs/day: 0.12    Years: 38.00    Pack years: 4.56    Types: Cigarettes  . Smokeless tobacco: Never Used  . Tobacco comment: 2 A DAY  Substance Use Topics  . Alcohol use: No    Alcohol/week: 0.0 oz  . Drug use: Yes    Types: "Crack" cocaine, Cocaine    Comment: Smoked crack 06/02/2017     Allergies   Acetaminophen and Other   Review of Systems Review of Systems  Constitutional: Negative for chills and fever.  Gastrointestinal: Negative for abdominal pain.  Musculoskeletal: Positive for myalgias.  Skin: Positive for wound.  Psychiatric/Behavioral: Positive for suicidal ideas.  All other systems reviewed and are negative.    Physical Exam Updated Vital Signs BP (!) 156/88 (BP Location: Left Arm)   Pulse (!) 101   Wt 40.8 kg (90 lb)   SpO2 98%   BMI 16.73 kg/m   Physical Exam  Constitutional: She appears well-developed and well-nourished. No distress.  Tearful, clutching her legs  HENT:  Head: Normocephalic and atraumatic.  Eyes: Conjunctivae are normal. Right eye exhibits no discharge. Left eye exhibits no discharge.  Neck: No JVD present.  No tracheal deviation present.  Cardiovascular: Normal rate, regular rhythm, normal heart sounds and intact distal pulses.  Pulmonary/Chest: Effort normal and breath sounds normal.  Abdominal: Soft. Bowel sounds are normal. She exhibits no distension. There is no tenderness.  Musculoskeletal: She exhibits no edema.  Bilateral BKA, well-healing granulation tissue, no drainage noted.  Moves extremities spontaneously with 5/5 strength  Neurological: She is alert.  Skin: Skin is warm and dry. No erythema.  Psychiatric: Her mood appears anxious. She is agitated. She expresses suicidal ideation. She expresses no homicidal ideation. She expresses no suicidal plans and no homicidal plans.  Nursing note and vitals reviewed.    ED Treatments / Results  Labs (all labs ordered are listed, but only abnormal results  are displayed) Labs Reviewed  CBC - Abnormal; Notable for the following components:      Result Value   Platelets 557 (*)    All other components within normal limits  RAPID URINE DRUG SCREEN, HOSP PERFORMED - Abnormal; Notable for the following components:   Cocaine POSITIVE (*)    Benzodiazepines POSITIVE (*)    Tetrahydrocannabinol POSITIVE (*)    All other components within normal limits  I-STAT BETA HCG BLOOD, ED ([facility], [facility], [facility] ONLY) - Abnormal; Notable for the following components:   I-stat hCG, quantitative 8.6 (*)    All other components within normal limits  COMPREHENSIVE METABOLIC PANEL  ETHANOL  SALICYLATE LEVEL  ACETAMINOPHEN LEVEL  PREGNANCY, URINE    EKG  EKG Interpretation None       Radiology No results found.  Procedures Procedures (including critical care time)  Medications Ordered in ED Medications  traMADol (ULTRAM) tablet 50 mg (not administered)     Initial Impression / Assessment and Plan / ED Course  I have reviewed the triage vital signs and the nursing notes.  Pertinent labs & imaging results that were available during my care of the  patient were reviewed by me and considered in my medical decision making (see chart for details).     Patient with suicidal ideation secondary to her chronic pain.  Afebrile, vital signs are stable.  No evidence of cellulitis, abscess, or wound infection.  She was given 1 tablet of tramadol which she is prescribed to take at home but states she cannot afford to fill, with improvement in her pain.  Initially very tearful, but on my reevaluation she is resting comfortably.  Per RN, patient is in no apparent distress when a provider is not in the room.  No leukocytosis, CMP unremarkable.Urine drug screen positive for cocaine, benzodiazepines, and THC. Of note, initial i-STAT beta hCG was positive, as well as urine pregnancy.  Quantitative hCG of 11.  Spoke with patient who is adamant that she is not sexually active "for several months at least ".  She states "my last period was in my 72s or 30s ".  I discussed with patient that she should follow-up with her primary care physician in 2 days or return to the ED for repeat beta-hCG to track her levels.  She verbalized understanding of and agreement with this plan.  She is otherwise medically cleared for TTS evaluation.  I explained to patient that follow-up with pain management will be imperative.  11:25 PM TTS recommends overnight observation and monitoring with re-evaluation in the AM.  She is here voluntarily but may require IVC if she attempts to leave prior to their assessment. Final Clinical Impressions(s) / ED Diagnoses   Final diagnoses:  None    ED Discharge Orders    None       Debroah Baller 08/15/17 1523    Virgel Manifold, MD 08/17/17 1237

## 2017-08-14 NOTE — BH Assessment (Signed)
[facility] Assessment Progress Note  Per Lindon Romp, NP pt is recommended for overnight observation and to be reassessed in the AM due to current SI. Pt's nurse [name], RN and EDP Nils Flack, Mina A, PA-C have been notified.  Lind Covert, MSW, LCSW Therapeutic Triage Specialist  [phone]

## 2017-08-14 NOTE — ED Triage Notes (Signed)
Patient arrived to ER via EMS with complaints of severe bilateral leg pain after a recent bilateral BKA. Pt states "I can't take this pain! I want to die! Help me, I need OxyContin now!". Pt having difficulty talking during assessment due to crying. Pt rates pain a "20 out of 10". Pt states her amputation was six weeks ago. Sitter at bedside for safety.

## 2017-08-14 NOTE — BH Assessment (Addendum)
Tele Assessment Note   Patient Name: [name] MRN: [id number] Referring Physician: Dr. Wilson Singer, MD Location of Patient: [facility] Location of Provider: [facility]  [name] is an 56 y.o. female who presents to the ED voluntarily due to [facility] without a plan. 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 Pt reports she has attempted in the past and per chart pt has been admitted to the inpt hospital at [facility] in 2016 and 2015. Pt has a hx of substance abuse and endorses using cocaine daily. Pt states she also hears voices that "tell me to do things." Pt crying and screaming hysterically throughout assessment. Pt often goes from crying hysterically to talking in a euthymic tone. Pt states she feels she does not have any supports and is currently living in a boarding house. Pt was recently assessed by TTS on 08/13/17 in which she was inconsistent with her SI. Pt at one point during the assessment on 08/13/17 stated she was suicidal, then later during the same assessment stated she was not suicidal.   Per Lindon Romp, NP pt is recommended for overnight observation and to be reassessed in the AM due to current SI. Pt's nurse [name], RN and EDP Nils Flack, Mina A, PA-C have been notified.  Diagnosis: MDD, recurrent, moderate, w/ psychotic features; Cocaine Use Disorder; Cannabis Use Disorder  Past Medical History:  Past Medical History:  Diagnosis Date  . CAP (community acquired pneumonia) 03/2014 X 2  . Cocaine abuse ([place])    ongoing with resultant vaculitis.  . Depression   . Headache    "weekly" (07/29/2016)  . Hypertension   . Inflammatory arthritis   . Migraines    "probably 5-6/yr" (07/29/2016)  . Normocytic anemia    BL Hgb 9.8-12. Last anemia panel 04/2010 - showing Fe 19, ferritin 101.  Pt on monthly B12 injections  . Rheumatoid arthritis(714.0)    patient reported  .  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 Followed by Dr. Louanne Skye    Past Surgical History:  Procedure Laterality Date  . HERNIA REPAIR     "stomach"  . SKIN BIOPSY Bilateral    shin nodules    Family History:  Family History  Problem Relation Age of Onset  . Breast cancer Mother        Breast cancer  . Alcohol abuse Mother   . Colon cancer Maternal Aunt 60  . Alcohol abuse Father     Social History:  reports that she has been smoking cigarettes.  She has a 4.56 pack-year smoking history. she has never used smokeless tobacco. She reports that she uses drugs. Drugs: "Crack" cocaine and Cocaine. She reports that she does not drink alcohol.  Additional Social History:  Alcohol / Drug Use Pain Medications: See MAR Prescriptions: See MAR Over the Counter: See MAR History of alcohol / drug use?: Yes Substance #1 Name of Substance 1: Cocaine 1 - Age of First Use: 19 1 - Amount (size/oz): nickle bag 1 - Frequency: daily 1 - Duration: ongoing 1 - Last Use / Amount: 08/14/17 Substance #2 Name of Substance 2: Cannabis 2 - Age of First Use: 20 2 - Amount (size/oz): 1 joint 2 - Frequency: weekly 2 - Duration: ongoing 2 - Last Use / Amount: 08/08/17  CIWA: CIWA-Ar BP: (!) 156/88 Pulse  Rate: (!) 101 COWS:    PATIENT STRENGTHS: (choose at least two) Warehouse manager means  Allergies:  Allergies  Allergen Reactions  . Acetaminophen Swelling and Other (See Comments)    Reaction:  Eyelid swelling  . Other Other (See Comments)    Pt states she is allergic to a blood pressure medication, but does not know the reaction or medication    Home Medications:  (Not in a hospital admission)  OB/GYN Status:  No LMP recorded. Patient is postmenopausal.  General Assessment Data Location of Assessment: [facility] TTS Assessment: In system Is this a Tele or Face-to-Face Assessment?: Tele Assessment Is this an Initial  Assessment or a Re-assessment for this encounter?: Initial Assessment Marital status: Single Is patient pregnant?: No Pregnancy Status: No Living Arrangements: Group Home Can pt return to current living arrangement?: Yes Admission Status: Voluntary Is patient capable of signing voluntary admission?: Yes Referral Source: Self/Family/Friend Insurance type: Medicaid     Crisis Care Plan Living Arrangements: Group Home Name of Psychiatrist: none Name of Therapist: none  Education Status Is patient currently in school?: No Highest grade of school patient has completed: 11th Contact person: self  Risk to self with the past 6 months Suicidal Ideation: Yes-Currently Present Has patient been a risk to self within the past 6 months prior to admission? : Yes Suicidal Intent: No Has patient had any suicidal intent within the past 6 months prior to admission? : No Is patient at risk for suicide?: Yes Suicidal Plan?: No Has patient had any suicidal plan within the past 6 months prior to admission? : No Access to Means: No What has been your use of drugs/alcohol within the last 12 months?: reports to daily cocaine and marijuana use  Previous Attempts/Gestures: Yes How many times?: 2 Triggers for Past Attempts: Unpredictable Intentional Self Injurious Behavior: None Family Suicide History: No Recent stressful life event(s): Recent negative physical changes Persecutory voices/beliefs?: No Depression: Yes Depression Symptoms: Despondent, Tearfulness, Isolating, Fatigue, Guilt, Loss of interest in usual pleasures, Feeling worthless/self pity, Feeling angry/irritable Substance abuse history and/or treatment for substance abuse?: Yes Suicide prevention information given to non-admitted patients: Not applicable  Risk to Others within the past 6 months Homicidal Ideation: No Does patient have any lifetime risk of violence toward others beyond the six months prior to admission? : No Thoughts  of Harm to Others: No Current Homicidal Intent: No Current Homicidal Plan: No Access to Homicidal Means: No History of harm to others?: No Assessment of Violence: None Noted Does patient have access to weapons?: Yes (Comment)(knives) Criminal Charges Pending?: No Does patient have a court date: No Is patient on probation?: No  Psychosis Hallucinations: Auditory, With command Delusions: None noted  Mental Status Report Appearance/Hygiene: In scrubs, Disheveled Eye Contact: Fair Motor Activity: Unsteady, Rigidity Speech: Loud Level of Consciousness: Crying, Restless Mood: Depressed, Helpless, Sad Affect: Depressed, Sad Anxiety Level: None Thought Processes: Coherent, Relevant Judgement: Partial Orientation: Person, Place, Time, Appropriate for developmental age, Situation Obsessive Compulsive Thoughts/Behaviors: None  Cognitive Functioning Concentration: Normal Memory: Remote Intact, Recent Intact IQ: Average Insight: Fair Impulse Control: Fair Appetite: Good Sleep: Increased Total Hours of Sleep: 12 Vegetative Symptoms: None  ADLScreening [facility] Assessment Services) Patient's cognitive ability adequate to safely complete daily activities?: Yes Patient able to express need for assistance with ADLs?: Yes Independently performs ADLs?: No  Prior Inpatient Therapy Prior Inpatient Therapy: Yes Prior Therapy Dates: 2016, 2015 Prior Therapy Facilty/Provider(s): [facility] Reason for Treatment: MDD, SI  Prior Outpatient Therapy  Prior Outpatient Therapy: No Does patient have an ACCT team?: No Does patient have Intensive In-House Services?  : No Does patient have [facility] services? : No Does patient have P4CC services?: No  ADL Screening (condition at time of admission) Patient's cognitive ability adequate to safely complete daily activities?: Yes Is the patient deaf or have difficulty hearing?: No Does the patient have difficulty seeing, even when wearing glasses/contacts?:  No Does the patient have difficulty concentrating, remembering, or making decisions?: No Patient able to express need for assistance with ADLs?: Yes Does the patient have difficulty dressing or bathing?: Yes Independently performs ADLs?: No Communication: Independent Dressing (OT): Needs assistance Is this a change from baseline?: Pre-admission baseline Grooming: Independent Feeding: Independent Bathing: Needs assistance Is this a change from baseline?: Pre-admission baseline Toileting: Needs assistance Is this a change from baseline?: Pre-admission baseline In/Out Bed: Needs assistance Is this a change from baseline?: Pre-admission baseline Walks in Home: Needs assistance Is this a change from baseline?: Pre-admission baseline Does the patient have difficulty walking or climbing stairs?: Yes Weakness of Legs: Both Weakness of Arms/Hands: None  Home Assistive Devices/Equipment Home Assistive Devices/Equipment: Wheelchair    Abuse/Neglect Assessment (Assessment to be complete while patient is alone) Abuse/Neglect Assessment Can Be Completed: Yes Physical Abuse: Denies Verbal Abuse: Denies Sexual Abuse: Denies Exploitation of patient/patient's resources: Denies Self-Neglect: Denies     Regulatory affairs officer (For Healthcare) Does Patient Have a Medical Advance Directive?: Yes Does patient want to make changes to medical advance directive?: No - Patient declined Type of Advance Directive: Living will Copy of Living Will in Chart?: No - copy requested Would patient like information on creating a medical advance directive?: No - Patient declined    Additional Information 1:1 In Past 12 Months?: No CIRT Risk: No Elopement Risk: No Does patient have medical clearance?: Yes     Disposition:  Disposition Initial Assessment Completed for this Encounter: Yes Disposition of Patient: Re-evaluation by Psychiatry recommended(per Lindon Romp, NP)  This service was provided via  telemedicine using a 2-way, interactive audio and video technology.  Names of all persons participating in this telemedicine service and their role in this encounter. Name: [name] Role: Patient  Name: Lind Covert Role: TTS Counselor          Lyanne Co 08/14/2017 11:33 PM

## 2017-08-15 DIAGNOSIS — F1494 Cocaine use, unspecified with cocaine-induced mood disorder: Secondary | ICD-10-CM | POA: Diagnosis not present

## 2017-08-15 DIAGNOSIS — F1910 Other psychoactive substance abuse, uncomplicated: Secondary | ICD-10-CM | POA: Diagnosis not present

## 2017-08-15 DIAGNOSIS — I169 Hypertensive crisis, unspecified: Secondary | ICD-10-CM

## 2017-08-15 DIAGNOSIS — Z89619 Acquired absence of unspecified leg above knee: Secondary | ICD-10-CM | POA: Diagnosis not present

## 2017-08-15 DIAGNOSIS — F332 Major depressive disorder, recurrent severe without psychotic features: Secondary | ICD-10-CM

## 2017-08-15 DIAGNOSIS — F17210 Nicotine dependence, cigarettes, uncomplicated: Secondary | ICD-10-CM

## 2017-08-15 DIAGNOSIS — Z811 Family history of alcohol abuse and dependence: Secondary | ICD-10-CM

## 2017-08-15 LAB — HCG, QUANTITATIVE, PREGNANCY: hCG, Beta Chain, Quant, S: 11 m[IU]/mL — ABNORMAL HIGH (ref <5)

## 2017-08-15 MED ORDER — LORAZEPAM 1 MG PO TABS
1.0000 mg | ORAL_TABLET | Freq: Four times a day (QID) | ORAL | Status: DC | PRN
  Administered 2017-08-15 – 2017-08-16 (×2): 1 mg via ORAL
  Filled 2017-08-15 (×2): qty 1

## 2017-08-15 MED ORDER — TRAMADOL HCL 50 MG PO TABS
50.0000 mg | ORAL_TABLET | Freq: Two times a day (BID) | ORAL | Status: DC | PRN
  Administered 2017-08-15 – 2017-08-17 (×3): 50 mg via ORAL
  Filled 2017-08-15 (×3): qty 1

## 2017-08-15 NOTE — ED Provider Notes (Signed)
  Physical Exam  BP (!) 164/77 (BP Location: Left Arm)   Pulse 98   Temp 99.7 F (37.6 C) (Oral)   Resp 20   Wt 40.8 kg (90 lb)   SpO2 98%   BMI 16.73 kg/m   Physical Exam  ED Course  Procedures  MDM I was called by nurse regarding pain control. Patient was on tramadol before and requesting tramadol for pain and ativan for anxiety. I noticed that she had HCG 11 yesterday but adamantly denies being pregnant. Repeat HCG unchanged. She has no vaginal bleeding or discharge or pain per previous provider. Slightly elevated HCG level can be worked up outpatient given absence of vaginal symptoms. Medically cleared, pending psych admission    Drenda Freeze, MD 08/15/17 [phone]

## 2017-08-15 NOTE — Consult Note (Signed)
[facility] Consult   Reason for Consult:  Depression with suicidal ideation Referring Physician:  EDP Patient Identification: [name] MRN:  [id number] Principal Diagnosis: MDD (major depressive disorder), recurrent episode, severe ([place]) Diagnosis:   Patient Active Problem List   Diagnosis Date Noted  . Cocaine-induced mood disorder with depressive symptoms ([place]) [F14.94] 08/10/2017  . Hypertensive crisis [I16.9]   . Phantom limb pain ([place]) [G54.6]   . S/P bilateral below knee amputation ([place]) [M21.117, Z89.511] 04/13/2017  . Tobacco abuse [Z72.0]   . Post-operative pain [G89.18]   . Acute blood loss anemia [D62]   . Atherosclerosis of native arteries of extremities with gangrene, left leg (HCC) [I70.262]   . Wound infection [T14.8XXA, L08.9] 03/30/2017  . AKI (acute kidney injury) ([place]) [N17.9] 02/07/2017  . Acute kidney injury ([place]) [N17.9] 02/06/2017  . Wound healing, delayed [T14.8XXD]   . Chest pain [R07.9] 04/07/2015  . Protein-calorie malnutrition, severe ([place]) [E43] 01/15/2015  . MDD (major depressive disorder), recurrent episode, severe ([place]) [F33.2] 10/16/2014  . Cocaine use disorder, severe, dependence ([place]) [F14.20] 10/10/2014  . Cocaine-induced vascular disorder ([place]) [B56.701, I99.9] 06/19/2013  . Essential hypertension [I10] 02/26/2010    Total Time spent with patient: 45 minutes  Subjective:   [name] is a 56 y.o. female patient admitted with concerns for pain and depression.  HPI:   Patient presents to ER for concern bilateral leg pain and associated depression increasing over the last 6 weeks coinciding with her recent leg amputation. Patient notes a history of circulatory issues related to prior and current cocaine use. Patient has just recently had her RIGHT leg amputated below the knee and has been noted increasing 20/10 bilateral leg phantom pains and pains with wound healing. Patient requesting additional pain control from ER. ER  provider requesting consult for further noted depression. Patient admits depression increasing over the last month as she was told by her brother to go to [facility] to look at other housing arrangements as her brother has been renting out rooms in their home to pay for a museum that he is opening up. Patient states that she and her brother had a verbal altercation felt her pain in BLE increase and came to the ER.  Pt denies homicidal ideation, denies auditory/visual hallucinations and does not appear to be responding to internal stimuli. Pt endorses passive suicidal ideation. Patient main stressors are her family relations, and her decreased independence since getting her legs amputated.  Pt would benefit from an inpatient psychiatric admission.      Past Psychiatric History:  Reports prior admission at [facility] "years ago".  Risk to Self: Suicidal Ideation: Yes-Currently Present Suicidal Intent: No Is patient at risk for suicide?: Yes Suicidal Plan?: No Access to Means: No What has been your use of drugs/alcohol within the last 12 months?: reports to daily cocaine and marijuana use  How many times?: 2 Triggers for Past Attempts: Unpredictable Intentional Self Injurious Behavior: None Risk to Others: Homicidal Ideation: No Thoughts of Harm to Others: No Current Homicidal Intent: No Current Homicidal Plan: No Access to Homicidal Means: No History of harm to others?: No Assessment of Violence: None Noted Does patient have access to weapons?: Yes (Comment)(knives) Criminal Charges Pending?: No Does patient have a court date: No Prior Inpatient Therapy: Prior Inpatient Therapy: Yes Prior Therapy Dates: 2016, 2015 Prior Therapy Facilty/Provider(s): [facility] Reason for Treatment: MDD, SI Prior Outpatient Therapy: Prior Outpatient Therapy: No Does patient have an ACCT team?: No Does patient have  Intensive In-House Services?  : No Does patient have [facility] services? : No Does patient have P4CC services?:  No  Past Medical History:  Past Medical History:  Diagnosis Date  . CAP (community acquired pneumonia) 03/2014 X 2  . Cocaine abuse ([place])    ongoing with resultant vaculitis.  . Depression   . Headache    "weekly" (07/29/2016)  . Hypertension   . Inflammatory arthritis   . Migraines    "probably 5-6/yr" (07/29/2016)  . Normocytic anemia    BL Hgb 9.8-12. Last anemia panel 04/2010 - showing Fe 19, ferritin 101.  Pt on monthly B12 injections  . Rheumatoid arthritis(714.0)    patient reported  . 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 Followed by Dr. Louanne Skye    Past Surgical History:  Procedure Laterality Date  . HERNIA REPAIR     "stomach"  . SKIN BIOPSY Bilateral    shin nodules   Family History:  Family History  Problem Relation Age of Onset  . Breast cancer Mother        Breast cancer  . Alcohol abuse Mother   . Colon cancer Maternal Aunt 65  . Alcohol abuse Father    Family Psychiatric  History: Unknown Social History:  Social History   Substance and Sexual Activity  Alcohol Use No  . Alcohol/week: 0.0 oz     Social History   Substance and Sexual Activity  Drug Use Yes  . Types: "Crack" cocaine, Cocaine   Comment: Smoked crack 06/02/2017    Social History   Socioeconomic History  . Marital status: Single    Spouse name: None  . Number of children: 0  . Years of education: 11th grade  . Highest education level: None  Social Needs  . Financial resource strain: None  . Food insecurity - worry: None  . Food insecurity - inability: None  . Transportation needs - medical: None  . Transportation needs - non-medical: None  Occupational History  . Occupation: Disability    Comment: since 2011, due to her rheumatoid arthritis  Tobacco Use  . Smoking status: Current Every Day Smoker    Packs/day: 0.12    Years: 38.00    Pack years: 4.56    Types: Cigarettes  . Smokeless tobacco: Never Used  .  Tobacco comment: 2 A DAY  Substance and Sexual Activity  . Alcohol use: No    Alcohol/week: 0.0 oz  . Drug use: Yes    Types: "Crack" cocaine, Cocaine    Comment: Smoked crack 06/02/2017  . Sexual activity: Not Currently  Other Topics Concern  . None  Social History Narrative   Unemployed:  cleaning in past   Living at [place]; has Medicaid.   crack/cocaine use; pt denies IVDU   tobacco:  1/2 ppd, trying to quit   alcohol:  none       Additional Social History:    Allergies:   Allergies  Allergen Reactions  . Acetaminophen Swelling and Other (See Comments)    Reaction:  Eyelid swelling  . Other Other (See Comments)    Pt states she is allergic to a blood pressure medication, but does not know the reaction or medication    Labs:  Results for orders placed or performed during the hospital encounter of 08/14/17 (from the past 48 hour(s))  Rapid urine drug screen (hospital performed)     Status: Abnormal   Collection Time: 08/14/17  9:51 PM  Result Value Ref Range   Opiates NONE DETECTED NONE DETECTED   Cocaine POSITIVE (A) NONE DETECTED   Benzodiazepines POSITIVE (A) NONE DETECTED   Amphetamines NONE DETECTED NONE DETECTED   Tetrahydrocannabinol POSITIVE (A) NONE DETECTED   Barbiturates NONE DETECTED NONE DETECTED    Comment:        DRUG SCREEN FOR MEDICAL PURPOSES ONLY.  IF CONFIRMATION IS NEEDED FOR ANY PURPOSE, NOTIFY LAB WITHIN 5 DAYS.        LOWEST DETECTABLE LIMITS FOR URINE DRUG SCREEN Drug Class       Cutoff (ng/mL) Amphetamine      1000 Barbiturate      200 Benzodiazepine   559 Tricyclics       741 Opiates          300 Cocaine          300 THC              50   Comprehensive metabolic panel     Status: Abnormal   Collection Time: 08/14/17 10:11 PM  Result Value Ref Range   Sodium 138 135 - 145 mmol/L   Potassium 3.7 3.5 - 5.1 mmol/L   Chloride 101 101 - 111 mmol/L   CO2 24 22 - 32 mmol/L   Glucose, Bld 111 (H) 65 - 99 mg/dL   BUN 31 (H) 6  - 20 mg/dL   Creatinine, Ser 0.96 0.44 - 1.00 mg/dL   Calcium 10.1 8.9 - 10.3 mg/dL   Total Protein 8.5 (H) 6.5 - 8.1 g/dL   Albumin 3.9 3.5 - 5.0 g/dL   AST 20 15 - 41 U/L   ALT 9 (L) 14 - 54 U/L   Alkaline Phosphatase 87 38 - 126 U/L   Total Bilirubin 0.7 0.3 - 1.2 mg/dL   GFR calc non Af Amer >60 >60 mL/min   GFR calc Af Amer >60 >60 mL/min    Comment: (NOTE) The eGFR has been calculated using the CKD EPI equation. This calculation has not been validated in all clinical situations. eGFR's persistently <60 mL/min signify possible Chronic Kidney Disease.    Anion gap 13 5 - 15  Ethanol     Status: None   Collection Time: 08/14/17 10:11 PM  Result Value Ref Range   Alcohol, Ethyl (B) <10 <10 mg/dL    Comment:        LOWEST DETECTABLE LIMIT FOR SERUM ALCOHOL IS 10 mg/dL FOR MEDICAL PURPOSES ONLY   Salicylate level     Status: None   Collection Time: 08/14/17 10:11 PM  Result Value Ref Range   Salicylate Lvl <6.3 2.8 - 30.0 mg/dL  Acetaminophen level     Status: Abnormal   Collection Time: 08/14/17 10:11 PM  Result Value Ref Range   Acetaminophen (Tylenol), Serum <10 (L) 10 - 30 ug/mL    Comment:        THERAPEUTIC CONCENTRATIONS VARY SIGNIFICANTLY. A RANGE OF 10-30 ug/mL MAY BE AN EFFECTIVE CONCENTRATION FOR MANY PATIENTS. HOWEVER, SOME ARE BEST TREATED AT CONCENTRATIONS OUTSIDE THIS RANGE. ACETAMINOPHEN CONCENTRATIONS >150 ug/mL AT 4 HOURS AFTER INGESTION AND >50 ug/mL AT 12 HOURS AFTER INGESTION ARE OFTEN ASSOCIATED WITH TOXIC REACTIONS.   cbc     Status: Abnormal   Collection Time: 08/14/17 10:11 PM  Result Value Ref Range   WBC 9.7 4.0 - 10.5 K/uL   RBC 4.63 3.87 - 5.11 MIL/uL   Hemoglobin 13.1 12.0 - 15.0 g/dL   HCT 37.4 36.0 -  46.0 %   MCV 80.8 78.0 - 100.0 fL   MCH 28.3 26.0 - 34.0 pg   MCHC 35.0 30.0 - 36.0 g/dL   RDW 14.1 11.5 - 15.5 %   Platelets 557 (H) 150 - 400 K/uL  I-Stat beta hCG blood, ED     Status: Abnormal   Collection Time: 08/14/17  10:21 PM  Result Value Ref Range   I-stat hCG, quantitative 8.6 (H) <5 mIU/mL   Comment 3            Comment:   GEST. AGE      CONC.  (mIU/mL)   <=1 WEEK        5 - 50     2 WEEKS       50 - 500     3 WEEKS       100 - 10,000     4 WEEKS     1,000 - 30,000        FEMALE AND NON-PREGNANT FEMALE:     LESS THAN 5 mIU/mL   Pregnancy, urine     Status: Abnormal   Collection Time: 08/14/17 10:38 PM  Result Value Ref Range   Preg Test, Ur POSITIVE (A) NEGATIVE    Comment:        THE SENSITIVITY OF THIS METHODOLOGY IS >20 mIU/mL.   hCG, quantitative, pregnancy     Status: Abnormal   Collection Time: 08/14/17 11:11 PM  Result Value Ref Range   hCG, Beta Chain, Quant, S 11 (H) <5 mIU/mL    Comment:          GEST. AGE      CONC.  (mIU/mL)   <=1 WEEK        5 - 50     2 WEEKS       50 - 500     3 WEEKS       100 - 10,000     4 WEEKS     1,000 - 30,000     5 WEEKS     3,500 - 115,000   6-8 WEEKS     12,000 - 270,000    12 WEEKS     15,000 - 220,000        FEMALE AND NON-PREGNANT FEMALE:     LESS THAN 5 mIU/mL     No current facility-administered medications for this encounter.    Current Outpatient Medications  Medication Sig Dispense Refill  . busPIRone (BUSPAR) 15 MG tablet Take 1 tablet (15 mg total) by mouth 3 (three) times daily. (Patient not taking: Reported on 08/09/2017) 90 tablet 0  . cyclobenzaprine (FLEXERIL) 5 MG tablet Take 1 tablet (5 mg total) by mouth 3 (three) times daily as needed (muscle soreness). (Patient not taking: Reported on 08/09/2017) 30 tablet 0  . escitalopram (LEXAPRO) 10 MG tablet Take 2 tablets (20 mg total) by mouth at bedtime. (Patient not taking: Reported on 08/09/2017) 60 tablet 4  . gabapentin (NEURONTIN) 600 MG tablet Take 1 tablet (600 mg total) by mouth 3 (three) times daily. (Patient not taking: Reported on 08/09/2017) 60 tablet 3  . hydrochlorothiazide (HYDRODIURIL) 25 MG tablet Take 1 tablet (25 mg total) daily by mouth. 30 tablet 1  . naproxen  (NAPROSYN) 250 MG tablet Take 1 tablet (250 mg total) by mouth 2 (two) times daily. (Patient not taking: Reported on 08/09/2017) 20 tablet 0  . potassium chloride (K-DUR) 10 MEQ tablet Take 1 tablet (10 mEq total) daily by mouth.  30 tablet 0  . sulfamethoxazole-trimethoprim (BACTRIM DS,SEPTRA DS) 800-160 MG tablet Take 1 tablet by mouth 2 (two) times daily. (Patient not taking: Reported on 08/09/2017) 20 tablet 0  . traMADol (ULTRAM) 50 MG tablet Take 2 tablets (100 mg total) by mouth every 12 (twelve) hours as needed for severe pain. (Patient not taking: Reported on 08/12/2017) 15 tablet 0    Musculoskeletal:  Gait & Station: Bilateral BKA unable to have any locomotion without assistance Patient leans: N/A  Psychiatric Specialty Exam: Physical Exam  Constitutional: She appears cachectic. She is active.  Neurological: She is alert.  Psychiatric: Her speech is normal and behavior is normal. Judgment and thought content normal. Cognition and memory are normal. She exhibits a depressed mood.    ROS  Blood pressure (!) 201/108, pulse (!) 112, temperature 99.5 F (37.5 C), temperature source Oral, resp. rate 20, weight 40.8 kg (90 lb), SpO2 96 %.Body mass index is 16.73 kg/m.  General Appearance: Patient is thin and appears weak  Eye Contact:  Fair  Speech:  Normal Rate  Volume:  Normal  Mood:  Negative  Affect:  Appropriate  Thought Process:  Goal Directed  Orientation:  Full (Time, Place, and Person)  Thought Content:  WDL  Suicidal Thoughts:  No  Homicidal Thoughts:  No  Memory:  Immediate;   Good Recent;   Good Remote;   Good  Judgement:  Intact  Insight:  Fair  Psychomotor Activity:  Normal  Concentration:  Concentration: Good and Attention Span: Good  Recall:  Good  Fund of Knowledge:  Good  Language:  Good  Akathisia:  No  Handed:  Right  AIMS (if indicated):     Assets:  Financial Resources/Insurance Housing Resilience  ADL's:  Impaired  Cognition:  WNL  Sleep:         Treatment Plan Summary: Daily contact with patient to assess and evaluate symptoms and progress in treatment and Medication management (see MAR)  Disposition: Recommend psychiatric Inpatient admission when medically cleared. TTS to seek appropriate placement  Ethelene Hal, NP 08/15/2017 12:52 PM

## 2017-08-15 NOTE — ED Notes (Signed)
Patient resting in bed with her eyes closed. No signs or symptoms of distress noted at this time. Sitter at bedside for safety.

## 2017-08-15 NOTE — ED Notes (Signed)
Dr. Dina Rich notified of patient's vitals.

## 2017-08-15 NOTE — Progress Notes (Signed)
Per Psychiatrist Hisada and NP Romilda Garret, patient meets inpatient criteria. CSW faxed patient's referral to: [facility], [place], [place], [place] and [facility].  Abundio Miu, [facility]  Clinical Social Worker [phone]

## 2017-08-16 ENCOUNTER — Ambulatory Visit: Payer: Self-pay | Admitting: Family Medicine

## 2017-08-16 ENCOUNTER — Ambulatory Visit (HOSPITAL_COMMUNITY): Payer: Self-pay

## 2017-08-16 DIAGNOSIS — F333 Major depressive disorder, recurrent, severe with psychotic symptoms: Secondary | ICD-10-CM

## 2017-08-16 DIAGNOSIS — I169 Hypertensive crisis, unspecified: Secondary | ICD-10-CM | POA: Diagnosis not present

## 2017-08-16 DIAGNOSIS — R443 Hallucinations, unspecified: Secondary | ICD-10-CM

## 2017-08-16 DIAGNOSIS — G546 Phantom limb syndrome with pain: Secondary | ICD-10-CM | POA: Diagnosis not present

## 2017-08-16 DIAGNOSIS — F1494 Cocaine use, unspecified with cocaine-induced mood disorder: Secondary | ICD-10-CM | POA: Diagnosis not present

## 2017-08-16 DIAGNOSIS — F1910 Other psychoactive substance abuse, uncomplicated: Secondary | ICD-10-CM | POA: Diagnosis not present

## 2017-08-16 DIAGNOSIS — R45851 Suicidal ideations: Secondary | ICD-10-CM

## 2017-08-16 DIAGNOSIS — R4587 Impulsiveness: Secondary | ICD-10-CM | POA: Diagnosis not present

## 2017-08-16 DIAGNOSIS — Z811 Family history of alcohol abuse and dependence: Secondary | ICD-10-CM | POA: Diagnosis not present

## 2017-08-16 MED ORDER — GABAPENTIN 100 MG PO CAPS
200.0000 mg | ORAL_CAPSULE | Freq: Two times a day (BID) | ORAL | Status: DC
  Administered 2017-08-16 – 2017-08-17 (×3): 200 mg via ORAL
  Filled 2017-08-16 (×3): qty 2

## 2017-08-16 MED ORDER — FLUOXETINE HCL 20 MG PO CAPS
20.0000 mg | ORAL_CAPSULE | Freq: Every day | ORAL | Status: DC
  Administered 2017-08-16 – 2017-08-17 (×2): 20 mg via ORAL
  Filled 2017-08-16 (×2): qty 1

## 2017-08-16 NOTE — Consult Note (Signed)
[facility] Psych ED Progress Note  08/16/2017 10:55 AM [name]  MRN:  [id number] Subjective: ''I am still feeling depressed and suicidal.''  Objective: Patient was seen, interviewed, her chart reviewed and case discussed with treatment team. Patient continues to endorse feeling depressed, lack of motivation, low energy level, hearing voices and having recurrent suicidal thoughts. Patient also reports occasional auditory hallucinations especially she smokes cocaine.  Principal Problem: MDD (major depressive disorder), recurrent episode, severe ([place]) Diagnosis:   Patient Active Problem List   Diagnosis Date Noted  . 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 Hypertensive crisis [I16.9]   . Phantom limb pain ([place]) [G54.6]   . S/P bilateral below knee amputation ([place]) [Q11.941, Z89.511] 04/13/2017  . Tobacco abuse [Z72.0]   . Post-operative pain [G89.18]   . Acute blood loss anemia [D62]   . Atherosclerosis of native arteries of extremities with gangrene, left leg (HCC) [I70.262]   . Wound infection [T14.8XXA, L08.9] 03/30/2017  . AKI (acute kidney injury) ([place]) [N17.9] 02/07/2017  . Acute kidney injury ([place]) [N17.9] 02/06/2017  . Wound healing, delayed [T14.8XXD]   . Chest pain [R07.9] 04/07/2015  . Protein-calorie malnutrition, severe ([place]) [E43] 01/15/2015  . MDD (major depressive disorder), recurrent episode, severe ([place]) [F33.2] 10/16/2014  . Cocaine use disorder, severe, dependence ([place]) [F14.20] 10/10/2014  . 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 Essential hypertension [I10] 02/26/2010   Total Time spent with patient: 30 minutes  Past Psychiatric History: as above  Past Medical History:  Past Medical History:  Diagnosis Date  . CAP (community acquired pneumonia) 03/2014 X 2  . Cocaine abuse ([place])    ongoing with resultant vaculitis.  . Depression   . Headache    "weekly" (07/29/2016)  . Hypertension    . Inflammatory arthritis   . Migraines    "probably 5-6/yr" (07/29/2016)  . Normocytic anemia    BL Hgb 9.8-12. Last anemia panel 04/2010 - showing Fe 19, ferritin 101.  Pt on monthly B12 injections  . Rheumatoid arthritis(714.0)    patient reported  . 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 Followed by Dr. Louanne Skye    Past Surgical History:  Procedure Laterality Date  . HERNIA REPAIR     "stomach"  . SKIN BIOPSY Bilateral    shin nodules   Family History:  Family History  Problem Relation Age of Onset  . Breast cancer Mother        Breast cancer  . Alcohol abuse Mother   . Colon cancer Maternal Aunt 62  . Alcohol abuse Father    Family Psychiatric  History:  Social History:  Social History   Substance and Sexual Activity  Alcohol Use No  . Alcohol/week: 0.0 oz     Social History   Substance and Sexual Activity  Drug Use Yes  . Types: "Crack" cocaine, Cocaine   Comment: Smoked crack 06/02/2017    Social History   Socioeconomic History  . Marital status: Single    Spouse name: None  . Number of children: 0  . Years of education: 11th grade  . Highest education level: None  Social Needs  . Financial resource strain: None  . Food insecurity - worry: None  . Food insecurity - inability: None  . Transportation needs - medical: None  . Transportation needs - non-medical: None  Occupational History  . Occupation: Disability    Comment: since 2011, due  to her rheumatoid arthritis  Tobacco Use  . Smoking status: Current Every Day Smoker    Packs/day: 0.12    Years: 38.00    Pack years: 4.56    Types: Cigarettes  . Smokeless tobacco: Never Used  . Tobacco comment: 2 A DAY  Substance and Sexual Activity  . Alcohol use: No    Alcohol/week: 0.0 oz  . Drug use: Yes    Types: "Crack" cocaine, Cocaine    Comment: Smoked crack 06/02/2017  . Sexual activity: Not Currently  Other Topics Concern  . None   Social History Narrative   Unemployed:  cleaning in past   Living at [facility]; has Medicaid.   crack/cocaine use; pt denies IVDU   tobacco:  1/2 ppd, trying to quit   alcohol:  none        Sleep: Fair  Appetite:  Fair  Current Medications: Current Facility-Administered Medications  Medication Dose Route Frequency Provider Last Rate Last Dose  . LORazepam (ATIVAN) tablet 1 mg  1 mg Oral Q6H PRN Yao, David Hsienta, MD   1 mg at 08/15/17 2101  . traMADol (ULTRAM) tablet 50 mg  50 mg Oral Q12H PRN Yao, David Hsienta, MD   50 mg at 08/15/17 2101   Current Outpatient Medications  Medication Sig Dispense Refill  . busPIRone (BUSPAR) 15 MG tablet Take 1 tablet (15 mg total) by mouth 3 (three) times daily. (Patient not taking: Reported on 08/09/2017) 90 tablet 0  . cyclobenzaprine (FLEXERIL) 5 MG tablet Take 1 tablet (5 mg total) by mouth 3 (three) times daily as needed (muscle soreness). (Patient not taking: Reported on 08/09/2017) 30 tablet 0  . escitalopram (LEXAPRO) 10 MG tablet Take 2 tablets (20 mg total) by mouth at bedtime. (Patient not taking: Reported on 08/09/2017) 60 tablet 4  . gabapentin (NEURONTIN) 600 MG tablet Take 1 tablet (600 mg total) by mouth 3 (three) times daily. (Patient not taking: Reported on 08/09/2017) 60 tablet 3  . hydrochlorothiazide (HYDRODIURIL) 25 MG tablet Take 1 tablet (25 mg total) daily by mouth. 30 tablet 1  . naproxen (NAPROSYN) 250 MG tablet Take 1 tablet (250 mg total) by mouth 2 (two) times daily. (Patient not taking: Reported on 08/09/2017) 20 tablet 0  . potassium chloride (K-DUR) 10 MEQ tablet Take 1 tablet (10 mEq total) daily by mouth. 30 tablet 0  . sulfamethoxazole-trimethoprim (BACTRIM DS,SEPTRA DS) 800-160 MG tablet Take 1 tablet by mouth 2 (two) times daily. (Patient not taking: Reported on 08/09/2017) 20 tablet 0  . traMADol (ULTRAM) 50 MG tablet Take 2 tablets (100 mg total) by mouth every 12 (twelve) hours as needed for severe pain.  (Patient not taking: Reported on 08/12/2017) 15 tablet 0    Lab Results:  Results for orders placed or performed during the hospital encounter of 08/14/17 (from the past 48 hour(s))  Rapid urine drug screen (hospital performed)     Status: Abnormal   Collection Time: 08/14/17  9:51 PM  Result Value Ref Range   Opiates NONE DETECTED NONE DETECTED   Cocaine POSITIVE (A) NONE DETECTED   Benzodiazepines POSITIVE (A) NONE DETECTED   Amphetamines NONE DETECTED NONE DETECTED   Tetrahydrocannabinol POSITIVE (A) NONE DETECTED   Barbiturates NONE DETECTED NONE DETECTED    Comment:        DRUG SCREEN FOR MEDICAL PURPOSES ONLY.  IF CONFIRMATION IS NEEDED FOR ANY PURPOSE, NOTIFY LAB WITHIN 5 DAYS.        LOWEST DETECTABLE LIMITS FOR   URINE DRUG SCREEN Drug Class       Cutoff (ng/mL) Amphetamine      1000 Barbiturate      200 Benzodiazepine   801 Tricyclics       655 Opiates          300 Cocaine          300 THC              50   Comprehensive metabolic panel     Status: Abnormal   Collection Time: 08/14/17 10:11 PM  Result Value Ref Range   Sodium 138 135 - 145 mmol/L   Potassium 3.7 3.5 - 5.1 mmol/L   Chloride 101 101 - 111 mmol/L   CO2 24 22 - 32 mmol/L   Glucose, Bld 111 (H) 65 - 99 mg/dL   BUN 31 (H) 6 - 20 mg/dL   Creatinine, Ser 0.96 0.44 - 1.00 mg/dL   Calcium 10.1 8.9 - 10.3 mg/dL   Total Protein 8.5 (H) 6.5 - 8.1 g/dL   Albumin 3.9 3.5 - 5.0 g/dL   AST 20 15 - 41 U/L   ALT 9 (L) 14 - 54 U/L   Alkaline Phosphatase 87 38 - 126 U/L   Total Bilirubin 0.7 0.3 - 1.2 mg/dL   GFR calc non Af Amer >60 >60 mL/min   GFR calc Af Amer >60 >60 mL/min    Comment: (NOTE) The eGFR has been calculated using the CKD EPI equation. This calculation has not been validated in all clinical situations. eGFR's persistently <60 mL/min signify possible Chronic Kidney Disease.    Anion gap 13 5 - 15  Ethanol     Status: None   Collection Time: 08/14/17 10:11 PM  Result Value Ref Range    Alcohol, Ethyl (B) <10 <10 mg/dL    Comment:        LOWEST DETECTABLE LIMIT FOR SERUM ALCOHOL IS 10 mg/dL FOR MEDICAL PURPOSES ONLY   Salicylate level     Status: None   Collection Time: 08/14/17 10:11 PM  Result Value Ref Range   Salicylate Lvl <3.7 2.8 - 30.0 mg/dL  Acetaminophen level     Status: Abnormal   Collection Time: 08/14/17 10:11 PM  Result Value Ref Range   Acetaminophen (Tylenol), Serum <10 (L) 10 - 30 ug/mL    Comment:        THERAPEUTIC CONCENTRATIONS VARY SIGNIFICANTLY. A RANGE OF 10-30 ug/mL MAY BE AN EFFECTIVE CONCENTRATION FOR MANY PATIENTS. HOWEVER, SOME ARE BEST TREATED AT CONCENTRATIONS OUTSIDE THIS RANGE. ACETAMINOPHEN CONCENTRATIONS >150 ug/mL AT 4 HOURS AFTER INGESTION AND >50 ug/mL AT 12 HOURS AFTER INGESTION ARE OFTEN ASSOCIATED WITH TOXIC REACTIONS.   cbc     Status: Abnormal   Collection Time: 08/14/17 10:11 PM  Result Value Ref Range   WBC 9.7 4.0 - 10.5 K/uL   RBC 4.63 3.87 - 5.11 MIL/uL   Hemoglobin 13.1 12.0 - 15.0 g/dL   HCT 37.4 36.0 - 46.0 %   MCV 80.8 78.0 - 100.0 fL   MCH 28.3 26.0 - 34.0 pg   MCHC 35.0 30.0 - 36.0 g/dL   RDW 14.1 11.5 - 15.5 %   Platelets 557 (H) 150 - 400 K/uL  I-Stat beta hCG blood, ED     Status: Abnormal   Collection Time: 08/14/17 10:21 PM  Result Value Ref Range   I-stat hCG, quantitative 8.6 (H) <5 mIU/mL   Comment 3  Comment:   GEST. AGE      CONC.  (mIU/mL)   <=1 WEEK        5 - 50     2 WEEKS       50 - 500     3 WEEKS       100 - 10,000     4 WEEKS     1,000 - 30,000        FEMALE AND NON-PREGNANT FEMALE:     LESS THAN 5 mIU/mL   Pregnancy, urine     Status: Abnormal   Collection Time: 08/14/17 10:38 PM  Result Value Ref Range   Preg Test, Ur POSITIVE (A) NEGATIVE    Comment:        THE SENSITIVITY OF THIS METHODOLOGY IS >20 mIU/mL.   hCG, quantitative, pregnancy     Status: Abnormal   Collection Time: 08/14/17 11:11 PM  Result Value Ref Range   hCG, Beta Chain, Quant, S 11  (H) <5 mIU/mL    Comment:          GEST. AGE      CONC.  (mIU/mL)   <=1 WEEK        5 - 50     2 WEEKS       50 - 500     3 WEEKS       100 - 10,000     4 WEEKS     1,000 - 30,000     5 WEEKS     3,500 - 115,000   6-8 WEEKS     12,000 - 270,000    12 WEEKS     15,000 - 220,000        FEMALE AND NON-PREGNANT FEMALE:     LESS THAN 5 mIU/mL   hCG, quantitative, pregnancy     Status: Abnormal   Collection Time: 08/15/17  8:31 PM  Result Value Ref Range   hCG, Beta Chain, Quant, S 11 (H) <5 mIU/mL    Comment:          GEST. AGE      CONC.  (mIU/mL)   <=1 WEEK        5 - 50     2 WEEKS       50 - 500     3 WEEKS       100 - 10,000     4 WEEKS     1,000 - 30,000     5 WEEKS     3,500 - 115,000   6-8 WEEKS     12,000 - 270,000    12 WEEKS     15,000 - 220,000        FEMALE AND NON-PREGNANT FEMALE:     LESS THAN 5 mIU/mL     Blood Alcohol level:  Lab Results  Component Value Date   ETH <10 08/14/2017   ETH <10 08/12/2017    Physical Findings: AIMS:  , ,  ,  ,    CIWA:    COWS:     Musculoskeletal: Strength & Muscle Tone: within normal limits Gait & Station: unsteady Patient leans: Front  Psychiatric Specialty Exam: Physical Exam  Psychiatric: Her speech is normal. She is slowed, withdrawn and actively hallucinating. Cognition and memory are normal. She expresses impulsivity. She exhibits a depressed mood. She expresses suicidal ideation.    Review of Systems  Constitutional: Negative.   HENT: Negative.   Eyes: Negative.   Respiratory: Negative.     Cardiovascular: Negative.   Gastrointestinal: Negative.   Genitourinary: Negative.   Musculoskeletal: Negative.   Skin: Negative.   Neurological: Negative.   Endo/Heme/Allergies: Negative.   Psychiatric/Behavioral: Positive for depression, substance abuse and suicidal ideas.    Blood pressure (!) 125/7, pulse 90, temperature 98.1 F (36.7 C), temperature source Oral, resp. rate 20, weight 40.8 kg (90 lb), SpO2 100  %.Body mass index is 16.73 kg/m.  General Appearance: Casual  Eye Contact:  Fair  Speech:  Clear and Coherent  Volume:  Decreased  Mood:  Depressed, Dysphoric and Hopeless  Affect:  Constricted  Thought Process:  Coherent  Orientation:  Full (Time, Place, and Person)  Thought Content:  Hallucinations: Auditory  Suicidal Thoughts:  Yes.  with intent/plan  Homicidal Thoughts:  No  Memory:  Immediate;   Fair Recent;   Fair Remote;   Fair  Judgement:  Poor  Insight:  Shallow  Psychomotor Activity:  Decreased and Psychomotor Retardation  Concentration:  Concentration: Fair and Attention Span: Fair  Recall:  Fair  Fund of Knowledge:  Fair  Language:  Good  Akathisia:  No  Handed:  Right  AIMS (if indicated):     Assets:  Communication Skills  ADL's:  Intact  Cognition:  WNL  Sleep:         Treatment Plan Summary: Daily contact with patient to assess and evaluate symptoms and progress in treatment and Medication management Start Prozac 20 mg daily for depression, Gabapentin 200 mg bid for cocaine/agitation.  Disposition: Recommend psychiatric Inpatient admission when medically cleared. TTS to seek appropriate placement  , , MD 08/16/2017, 10:55 AM 

## 2017-08-16 NOTE — BH Assessment (Signed)
[facility] Assessment Progress Note  Per Corena Pilgrim, MD, this pt requires psychiatric hospitalization at this time.  The following facilities have a history of providing Med Psych services; they have been contacted to seek placement for this pt, with results as noted:  [facility]: at [place]: unit is currently closed for renovation [facility]: pt is not eligible due to substance abuse   Jalene Mullet, [place] Triage Specialist [phone]

## 2017-08-16 NOTE — ED Notes (Signed)
ED Provider at bedside.  psych

## 2017-08-17 NOTE — BH Assessment (Signed)
[place] Assessment Progress Note  Per Corena Pilgrim, MD, this pt does not require psychiatric hospitalization at this time.  Pt is to be discharged from [facility] with recommendation to follow up with [facility].  This has been included in pt's discharge instructions.  Pt would also benefit from seeing Peer Support Specialists; they will be asked to speak to pt.  Pt's nurse has been notified.  Jalene Mullet, [place] Triage Specialist [phone]

## 2017-08-17 NOTE — BHH Suicide Risk Assessment (Signed)
Suicide Risk Assessment  Discharge Assessment   [facility] Discharge Suicide Risk Assessment   Principal Problem: Cocaine-induced mood disorder with depressive symptoms [facility]) Discharge Diagnoses:  Patient Active Problem List   Diagnosis Date Noted  . 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 Hypertensive crisis [I16.9]   . Phantom limb pain ([place]) [G54.6]   . S/P bilateral below knee amputation ([place]) [O37.858, Z89.511] 04/13/2017  . Tobacco abuse [Z72.0]   . Post-operative pain [G89.18]   . Acute blood loss anemia [D62]   . Atherosclerosis of native arteries of extremities with gangrene, left leg (HCC) [I70.262]   . Wound infection [T14.8XXA, L08.9] 03/30/2017  . AKI (acute kidney injury) ([place]) [N17.9] 02/07/2017  . Acute kidney injury ([place]) [N17.9] 02/06/2017  . Wound healing, delayed [T14.8XXD]   . Chest pain [R07.9] 04/07/2015  . Protein-calorie malnutrition, severe ([place]) [E43] 01/15/2015  . MDD (major depressive disorder), recurrent episode, severe ([place]) [F33.2] 10/16/2014  . Cocaine use disorder, severe, dependence ([place]) [F14.20] 10/10/2014  . Cocaine-induced vascular disorder ([place]) [I50.277, I99.9] 06/19/2013  . Essential hypertension [I10] 02/26/2010    Total Time spent with patient: 45 minutes  Musculoskeletal: Strength & Muscle Tone: within normal limits Gait & Station: normal Patient leans: N/A  Psychiatric Specialty Exam: Physical Exam  Constitutional: She is oriented to person, place, and time. She appears cachectic. She is active.  HENT:  Head: Normocephalic.  Respiratory: Effort normal.  Neurological: She is alert and oriented to person, place, and time.  Psychiatric: Her speech is normal and behavior is normal. Judgment and thought content normal. Cognition and memory are normal. She exhibits a depressed mood.   ROS Blood pressure 139/72, pulse 92, temperature 99.3 F (37.4 C), temperature source Oral, resp. rate 18,  weight 40.8 kg (90 lb), SpO2 99 %.Body mass index is 16.73 kg/m. General Appearance: Patient is thin and appears weak Eye Contact:  Good Speech:  Normal Rate Volume:  Normal Mood:  Depressed Affect:  Appropriate Thought Process:  Goal Directed Orientation:  Full (Time, Place, and Person) Thought Content:  WDL Suicidal Thoughts:  No Homicidal Thoughts:  No Memory:  Immediate;   Good Recent;   Good Remote;   Good Judgement:  Intact Insight:  Fair Psychomotor Activity:  Normal Concentration:  Concentration: Good and Attention Span: Good Recall:  Good Fund of Knowledge:  Good Language:  Good Akathisia:  No Handed:  Right AIMS (if indicated):    Assets:  Financial Resources/Insurance Housing Resilience ADL's:  Impaired Cognition:  WNL   Mental Status Per Nursing Assessment::   On Admission:   Bilateral leg pain causing suicidal ideation  Demographic Factors:  Low socioeconomic status and Unemployed  Loss Factors: Decline in physical health and Financial problems/change in socioeconomic status  Historical Factors: Impulsivity  Risk Reduction Factors:   Sense of responsibility to family and Living with another person, especially a relative  Continued Clinical Symptoms:  Depression:   Impulsivity Alcohol/Substance Abuse/Dependencies  Cognitive Features That Contribute To Risk:  Closed-mindedness    Suicide Risk:  Minimal: No identifiable suicidal ideation.  Patients presenting with no risk factors but with morbid ruminations; may be classified as minimal risk based on the severity of the depressive symptoms  Follow-up Information    Scot Jun, FNP Follow up in 2 day(s).   Specialty:  Family Medicine Why:  for repeat pregnancy hormone levels Contact information: [place] [address] [phone]  Plan Of Care/Follow-up recommendations:  Activity:  as tolerated Diet:  Heart Healthy    Ethelene Hal,  NP 08/17/2017, 1:09 PM

## 2017-08-17 NOTE — Consult Note (Signed)
[facility] Consult   Reason for Consult:  Depression with suicidal ideation Referring Physician:  EDP Patient Identification: GENISIS SONNIER MRN:  [id number] Principal Diagnosis: Cocaine-induced mood disorder with depressive symptoms [facility]) Diagnosis:   Patient Active Problem List   Diagnosis Date Noted  . Cocaine-induced mood disorder with depressive symptoms ([place]) [F14.94] 08/10/2017  . Hypertensive crisis [I16.9]   . Phantom limb pain ([place]) [G54.6]   . S/P bilateral below knee amputation ([place]) [T41.962, Z89.511] 04/13/2017  . Tobacco abuse [Z72.0]   . Post-operative pain [G89.18]   . Acute blood loss anemia [D62]   . Atherosclerosis of native arteries of extremities with gangrene, left leg ([facility]) [I70.262]   . Wound infection [T14.8XXA, L08.9] 03/30/2017  . AKI (acute kidney injury) ([place]) [N17.9] 02/07/2017  . Acute kidney injury ([place]) [N17.9] 02/06/2017  . Wound healing, delayed [T14.8XXD]   . Chest pain [R07.9] 04/07/2015  . Protein-calorie malnutrition, severe ([place]) [E43] 01/15/2015  . MDD (major depressive disorder), recurrent episode, severe ([place]) [F33.2] 10/16/2014  . Cocaine use disorder, severe, dependence ([place]) [F14.20] 10/10/2014  . Cocaine-induced vascular disorder ([place]) [I29.798, I99.9] 06/19/2013  . Essential hypertension [I10] 02/26/2010    Total Time spent with patient: 30 minutes  Subjective:   ACHAIA GARLOCK is a 56 y.o. female patient admitted with concerns for pain and depression.  HPI:   Pt was seen and chart reviewed with treatment team and Dr Darleene Cleaver. Patient presents to ER for concern bilateral leg pain and associated depression increasing over the last 6 weeks coinciding with her recent leg amputation. Patient notes a history of circulatory issues related to prior and current cocaine use. Patient has just recently had her RIGHT leg amputated below the knee and has been noted increasing bilateral leg phantom pains and pains with wound healing.  .  Patient states that she and her brother had a verbal altercation felt her pain in BLE increase and came to the ER.  Pt denies suicidal and homicidal ideation, denies auditory/visual hallucinations and does not appear to be responding to internal stimuli.  Patient main stressors are her family relations, and her decreased independence since getting her legs amputated. Pt is stable and is psychiatrically clear for discharge  Past Psychiatric History:  As above  Risk to Self: None Risk to Others: None Prior Inpatient Therapy: Prior Inpatient Therapy: Yes Prior Therapy Dates: 2016, 2015 Prior Therapy Facilty/Provider(s): [facility] Reason for Treatment: MDD, SI Prior Outpatient Therapy: Prior Outpatient Therapy: No Does patient have an ACCT team?: No Does patient have Intensive In-House Services?  : No Does patient have [facility] services? : No Does patient have P4CC services?: No  Past Medical History:  Past Medical History:  Diagnosis Date  . CAP (community acquired pneumonia) 03/2014 X 2  . Cocaine abuse ([place])    ongoing with resultant vaculitis.  . Depression   . Headache    "weekly" (07/29/2016)  . Hypertension   . Inflammatory arthritis   . Migraines    "probably 5-6/yr" (07/29/2016)  . Normocytic anemia    BL Hgb 9.8-12. Last anemia panel 04/2010 - showing Fe 19, ferritin 101.  Pt on monthly B12 injections  . Rheumatoid arthritis(714.0)    patient reported  . 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 Followed by Dr. Louanne Skye    Past Surgical History:  Procedure Laterality Date  . HERNIA REPAIR     "stomach"  . SKIN BIOPSY Bilateral  shin nodules   Family History:  Family History  Problem Relation Age of Onset  . Breast cancer Mother        Breast cancer  . Alcohol abuse Mother   . Colon cancer Maternal Aunt 80  . Alcohol abuse Father    Family Psychiatric  History: Unknown Social History:  Social History    Substance and Sexual Activity  Alcohol Use No  . Alcohol/week: 0.0 oz     Social History   Substance and Sexual Activity  Drug Use Yes  . Types: "Crack" cocaine, Cocaine   Comment: Smoked crack 06/02/2017    Social History   Socioeconomic History  . Marital status: Single    Spouse name: None  . Number of children: 0  . Years of education: 11th grade  . Highest education level: None  Social Needs  . Financial resource strain: None  . Food insecurity - worry: None  . Food insecurity - inability: None  . Transportation needs - medical: None  . Transportation needs - non-medical: None  Occupational History  . Occupation: Disability    Comment: since 2011, due to her rheumatoid arthritis  Tobacco Use  . Smoking status: Current Every Day Smoker    Packs/day: 0.12    Years: 38.00    Pack years: 4.56    Types: Cigarettes  . Smokeless tobacco: Never Used  . Tobacco comment: 2 A DAY  Substance and Sexual Activity  . Alcohol use: No    Alcohol/week: 0.0 oz  . Drug use: Yes    Types: "Crack" cocaine, Cocaine    Comment: Smoked crack 06/02/2017  . Sexual activity: Not Currently  Other Topics Concern  . None  Social History Narrative   Unemployed:  cleaning in past   Living at [place]; has Medicaid.   crack/cocaine use; pt denies IVDU   tobacco:  1/2 ppd, trying to quit   alcohol:  none       Additional Social History:    Allergies:   Allergies  Allergen Reactions  . Acetaminophen Swelling and Other (See Comments)    Reaction:  Eyelid swelling  . Other Other (See Comments)    Pt states she is allergic to a blood pressure medication, but does not know the reaction or medication    Labs:  Results for orders placed or performed during the hospital encounter of 08/14/17 (from the past 48 hour(s))  hCG, quantitative, pregnancy     Status: Abnormal   Collection Time: 08/15/17  8:31 PM  Result Value Ref Range   hCG, Beta Chain, Quant, S 11 (H) <5 mIU/mL     Comment:          GEST. AGE      CONC.  (mIU/mL)   <=1 WEEK        5 - 50     2 WEEKS       50 - 500     3 WEEKS       100 - 10,000     4 WEEKS     1,000 - 30,000     5 WEEKS     3,500 - 115,000   6-8 WEEKS     12,000 - 270,000    12 WEEKS     15,000 - 220,000        FEMALE AND NON-PREGNANT FEMALE:     LESS THAN 5 mIU/mL     Current Facility-Administered Medications  Medication Dose Route Frequency Provider Last Rate  Last Dose  . FLUoxetine (PROZAC) capsule 20 mg  20 mg Oral Daily Nickson Middlesworth, MD   20 mg at 08/17/17 1244  . gabapentin (NEURONTIN) capsule 200 mg  200 mg Oral BID Darleene Cleaver, Maycee Blasco, MD   200 mg at 08/17/17 1244  . LORazepam (ATIVAN) tablet 1 mg  1 mg Oral Q6H PRN Drenda Freeze, MD   1 mg at 08/16/17 1248  . traMADol (ULTRAM) tablet 50 mg  50 mg Oral Q12H PRN Drenda Freeze, MD   50 mg at 08/17/17 0900   Current Outpatient Medications  Medication Sig Dispense Refill  . busPIRone (BUSPAR) 15 MG tablet Take 1 tablet (15 mg total) by mouth 3 (three) times daily. (Patient not taking: Reported on 08/09/2017) 90 tablet 0  . cyclobenzaprine (FLEXERIL) 5 MG tablet Take 1 tablet (5 mg total) by mouth 3 (three) times daily as needed (muscle soreness). (Patient not taking: Reported on 08/09/2017) 30 tablet 0  . escitalopram (LEXAPRO) 10 MG tablet Take 2 tablets (20 mg total) by mouth at bedtime. (Patient not taking: Reported on 08/09/2017) 60 tablet 4  . gabapentin (NEURONTIN) 600 MG tablet Take 1 tablet (600 mg total) by mouth 3 (three) times daily. (Patient not taking: Reported on 08/09/2017) 60 tablet 3  . hydrochlorothiazide (HYDRODIURIL) 25 MG tablet Take 1 tablet (25 mg total) daily by mouth. 30 tablet 1  . naproxen (NAPROSYN) 250 MG tablet Take 1 tablet (250 mg total) by mouth 2 (two) times daily. (Patient not taking: Reported on 08/09/2017) 20 tablet 0  . potassium chloride (K-DUR) 10 MEQ tablet Take 1 tablet (10 mEq total) daily by mouth. 30 tablet 0  .  sulfamethoxazole-trimethoprim (BACTRIM DS,SEPTRA DS) 800-160 MG tablet Take 1 tablet by mouth 2 (two) times daily. (Patient not taking: Reported on 08/09/2017) 20 tablet 0  . traMADol (ULTRAM) 50 MG tablet Take 2 tablets (100 mg total) by mouth every 12 (twelve) hours as needed for severe pain. (Patient not taking: Reported on 08/12/2017) 15 tablet 0    Musculoskeletal:  Gait & Station: Bilateral BKA unable to have any locomotion without assistance Patient leans: N/A  Psychiatric Specialty Exam: Physical Exam  Constitutional: She is oriented to person, place, and time. She appears cachectic. She is active.  HENT:  Head: Normocephalic.  Respiratory: Effort normal.  Neurological: She is alert and oriented to person, place, and time.  Psychiatric: Her speech is normal and behavior is normal. Judgment and thought content normal. Cognition and memory are normal. She exhibits a depressed mood.    ROS  Blood pressure 139/72, pulse 92, temperature 99.3 F (37.4 C), temperature source Oral, resp. rate 18, weight 40.8 kg (90 lb), SpO2 99 %.Body mass index is 16.73 kg/m.  General Appearance: Patient is thin and appears weak  Eye Contact:  Good  Speech:  Normal Rate  Volume:  Normal  Mood:  Depressed  Affect:  Appropriate  Thought Process:  Goal Directed  Orientation:  Full (Time, Place, and Person)  Thought Content:  WDL  Suicidal Thoughts:  No  Homicidal Thoughts:  No  Memory:  Immediate;   Good Recent;   Good Remote;   Good  Judgement:  Intact  Insight:  Fair  Psychomotor Activity:  Normal  Concentration:  Concentration: Good and Attention Span: Good  Recall:  Good  Fund of Knowledge:  Good  Language:  Good  Akathisia:  No  Handed:  Right  AIMS (if indicated):     Assets:  Financial Resources/Insurance Housing  Resilience  ADL's:  Impaired  Cognition:  WNL  Sleep:        Treatment Plan Summary: Plan Cocaine-induced mood disorder with depressive symptoms ([place])  Discharge  Home [facility] up with [facility] for therapy and medication management Follow up with your PCP for any medical concerns and treatments Take all medications as prescribed Avoid the use of alcohol and illicit drugs  Disposition: No evidence of imminent risk to self or others at present.   Patient does not meet criteria for psychiatric inpatient admission. Supportive therapy provided about ongoing stressors. Discussed crisis plan, support from social network, calling 911, coming to the Emergency Department, and calling Suicide Hotline.   Ethelene Hal, NP 08/17/2017 12:57 PM  Patient seen face-to-face for psychiatric evaluation, chart reviewed and case discussed with the physician extender and developed treatment plan. Reviewed the information documented and agree with the treatment plan. Corena Pilgrim, MD

## 2017-08-17 NOTE — ED Notes (Signed)
[facility] has been called

## 2017-08-17 NOTE — ED Notes (Signed)
Peer support to see patient p-tar to tx home

## 2017-08-17 NOTE — ED Notes (Signed)
Pt does not have keys to get into her home, she gave me her family members number to call but no one answered; left a vm to return my call

## 2017-08-17 NOTE — ED Notes (Signed)
ED Provider at bedside.  Psych  Awaiting placement

## 2017-08-17 NOTE — ED Notes (Signed)
[name] [phone] brother

## 2017-08-17 NOTE — ED Notes (Signed)
Spoke to pts brother, curtis, he will be home by 6pm tonight, pt will be transported by ptar after 6pm to the home

## 2017-08-21 IMAGING — DX DG CHEST 2V
2 series · 2 of 2 positions shown · non-contrast
Comparison: 04/21/2015 chest radiograph.

CLINICAL DATA: Productive cough

EXAM:
CHEST  2 VIEW

[chest lat]
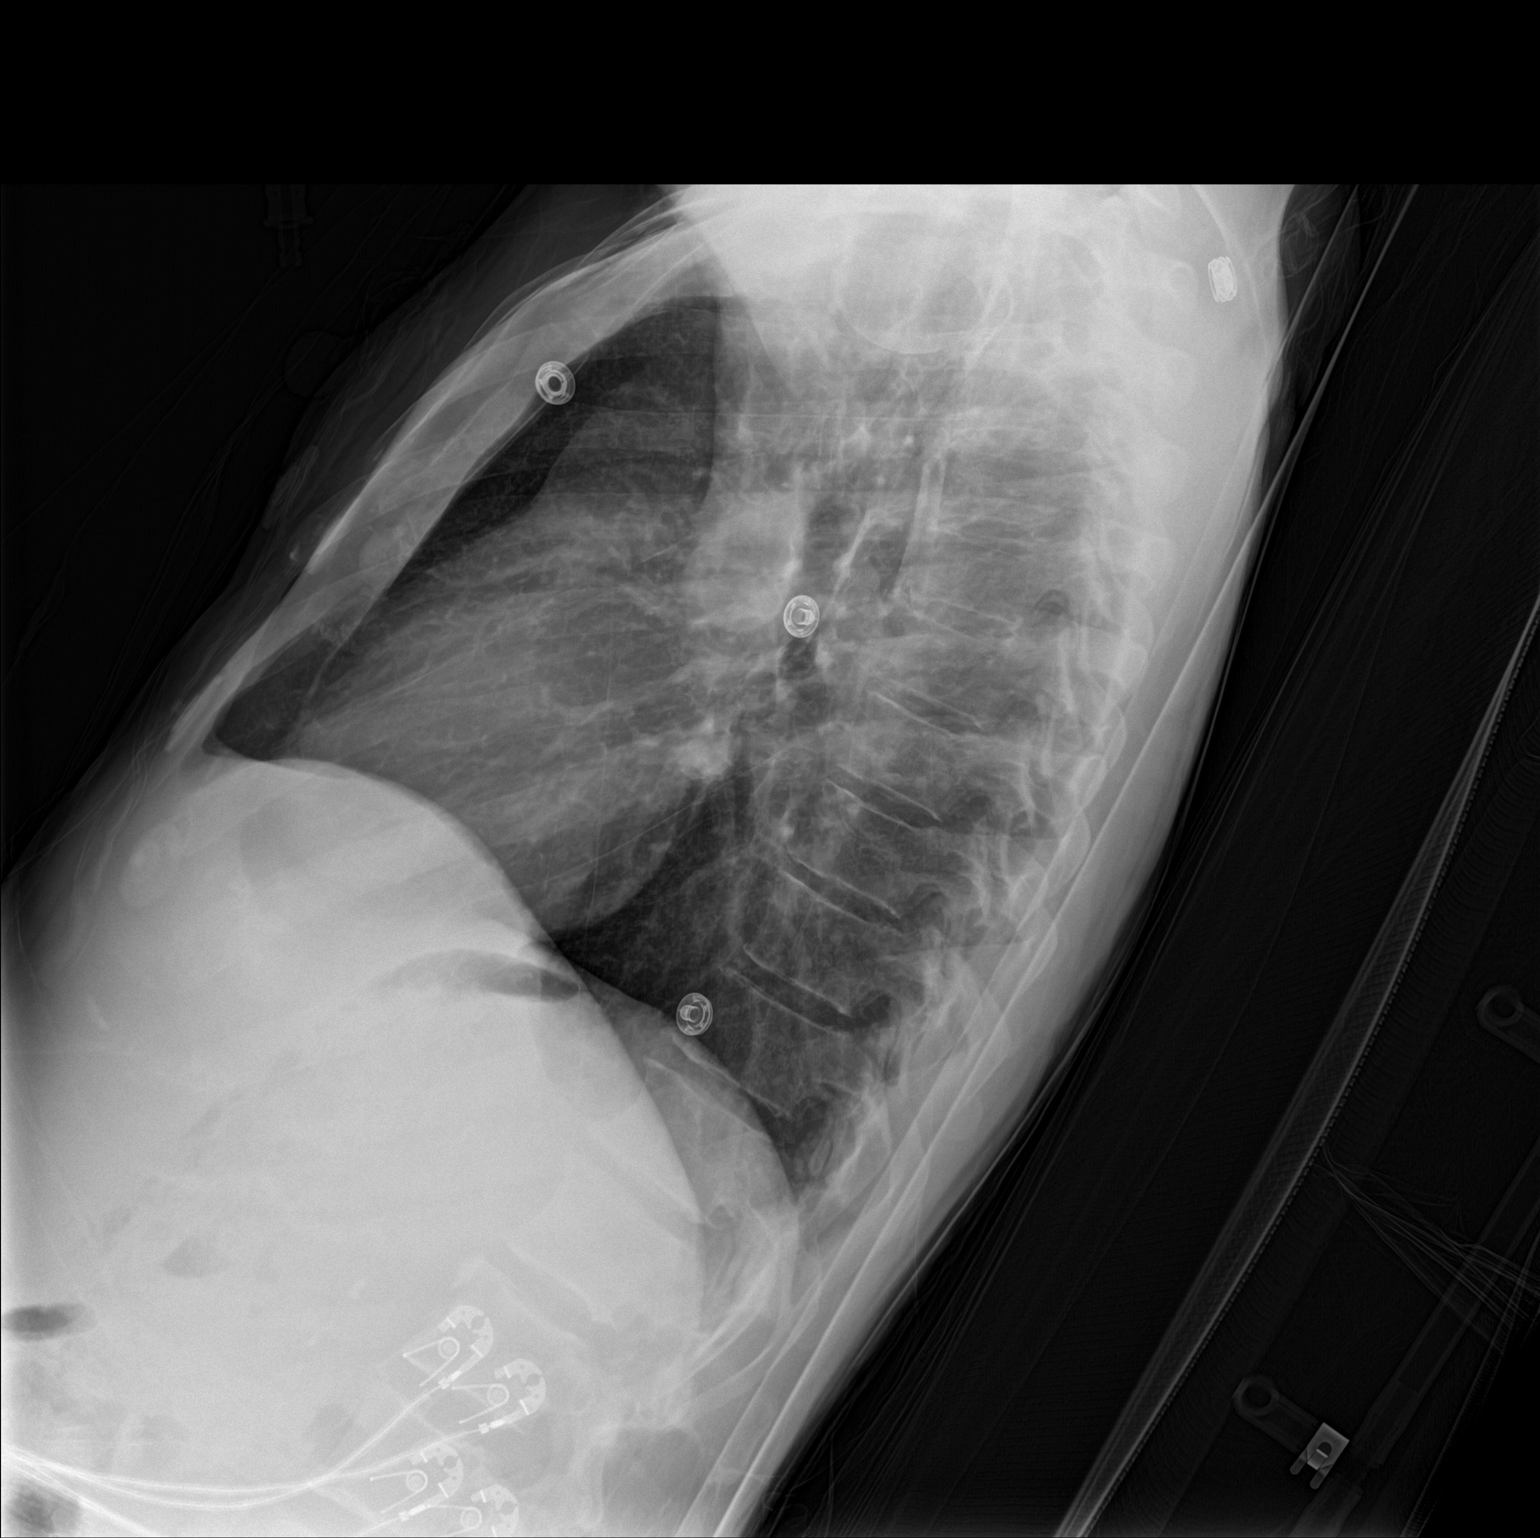

[chest ap]
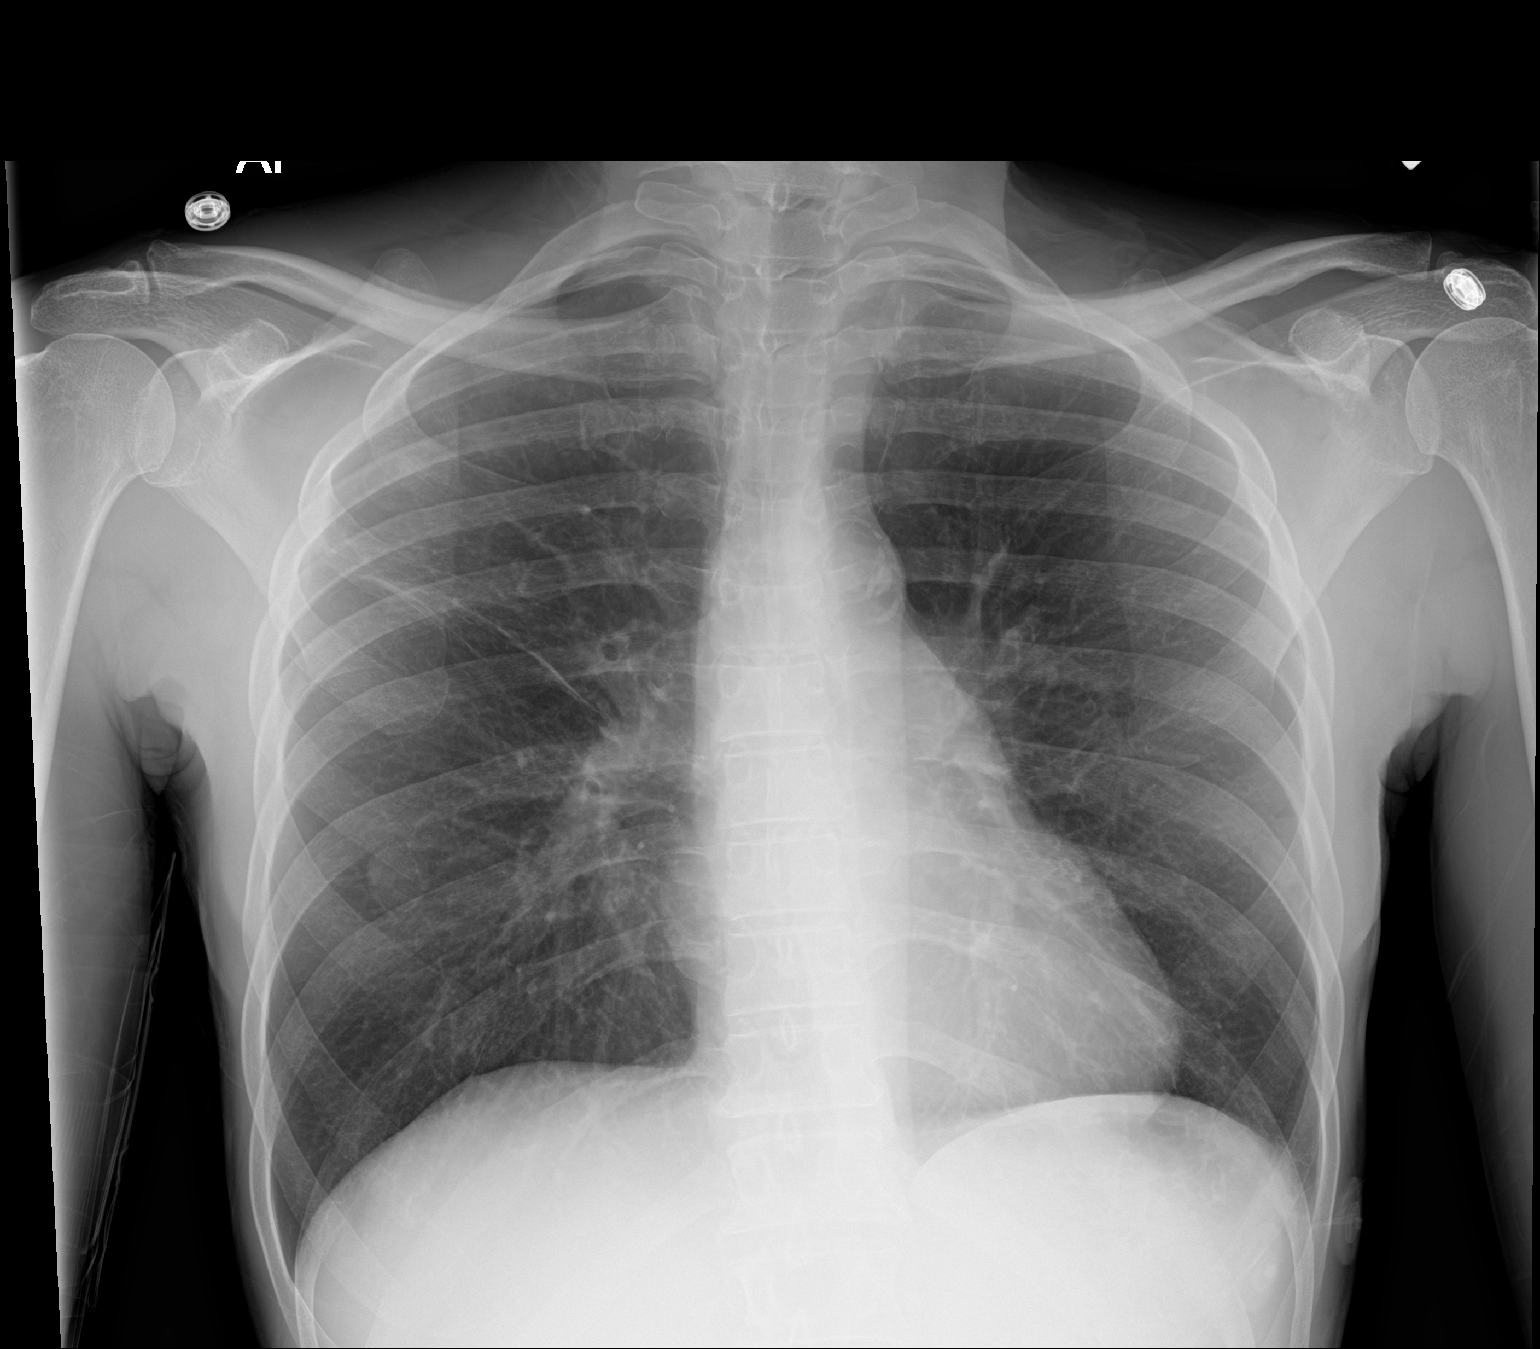

[2 of 2 positions shown; findings below may reference images not displayed]

FINDINGS: Stable cardiomediastinal silhouette with normal heart size. No
pneumothorax. No pleural effusion. Stable minimal scarring in the
mid to upper right lung. No new focal lung opacity. No pulmonary
edema.
IMPRESSION: No active cardiopulmonary disease.

## 2017-08-23 ENCOUNTER — Ambulatory Visit (INDEPENDENT_AMBULATORY_CARE_PROVIDER_SITE_OTHER): Payer: Medicaid Other | Admitting: Orthopedic Surgery

## 2017-09-08 IMAGING — DX DG TIBIA/FIBULA 2V*R*
2 series · 2 of 2 positions shown · non-contrast
Comparison: None.

CLINICAL DATA: 54-year-old female with possible infection of the
right lower extremity.

EXAM:
RIGHT TIBIA AND FIBULA - 2 VIEW

[tibia ap]
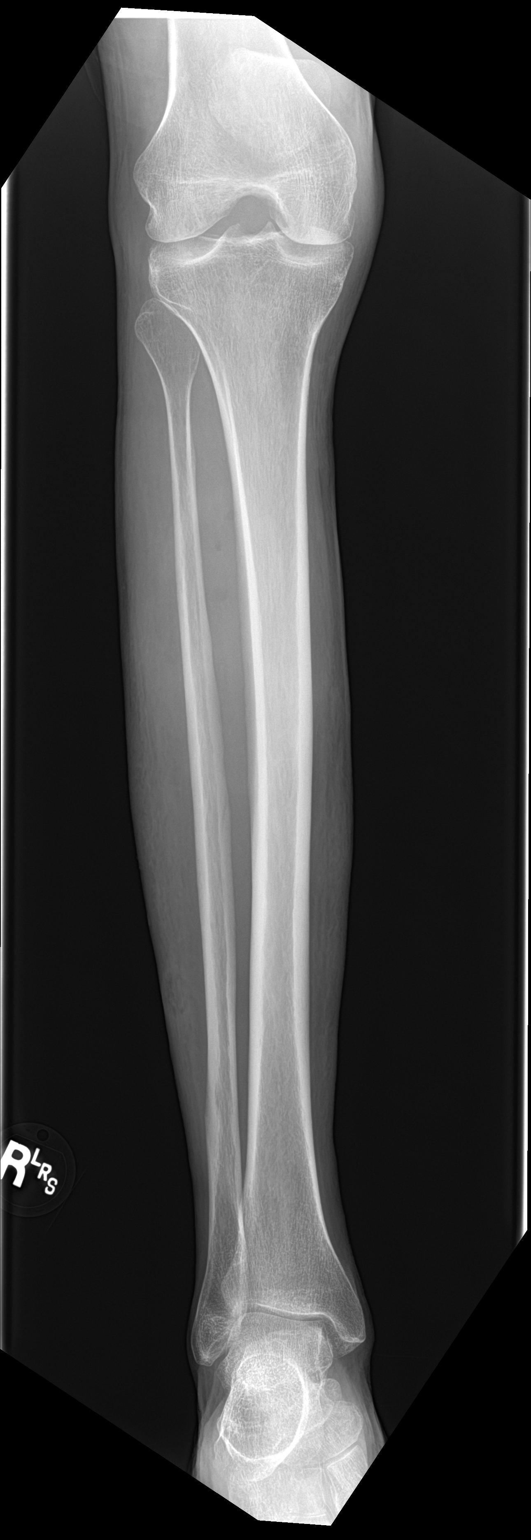

[tibia lat]
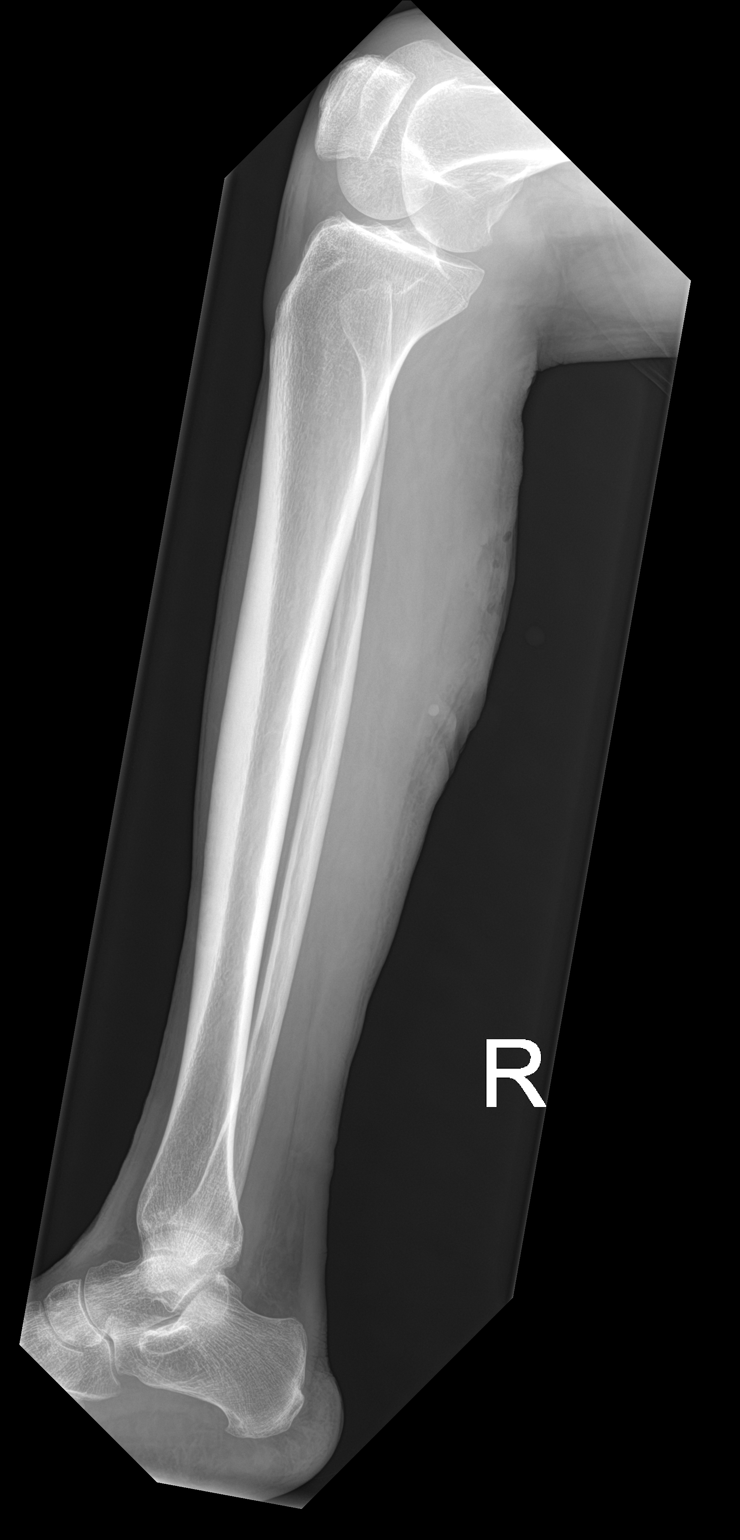

[2 of 2 positions shown; findings below may reference images not displayed]

FINDINGS: There is no acute fracture or dislocation. There is irregularity of
the superficial soft tissues of the posterior calf with small
pockets of subcutaneous appearing air. Clinical correlation is
recommended. No radiopaque foreign object identified.
IMPRESSION: Irregularity of the skin with small pockets of gas in the
subcutaneous soft tissues of the posterior calf concerning for
infection. Clinical correlation is recommended.

## 2017-09-08 IMAGING — DX DG CHEST 2V
2 series · 2 of 2 positions shown · non-contrast
Comparison: Chest radiograph dated 09/07/2015

CLINICAL DATA: 54-year-old female with chest pain

EXAM:
CHEST  2 VIEW

[chest lat]
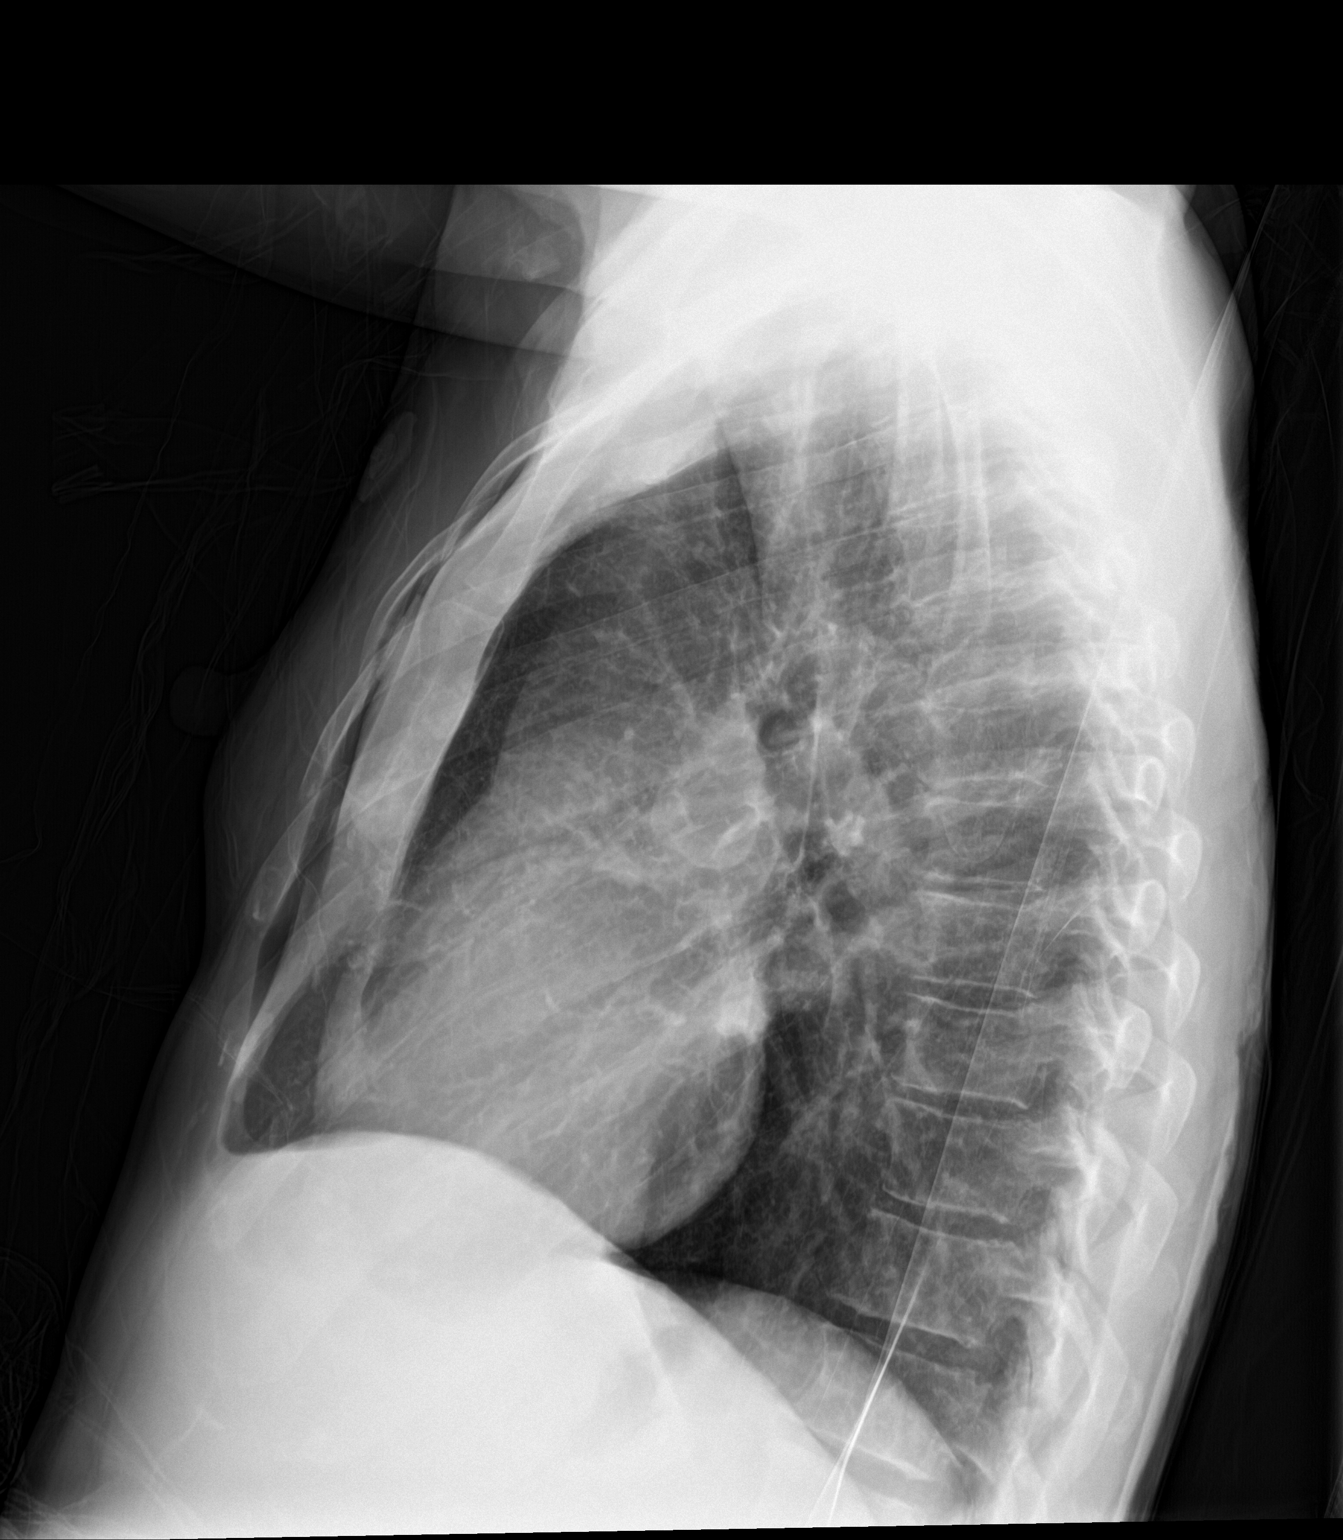

[chest ap]
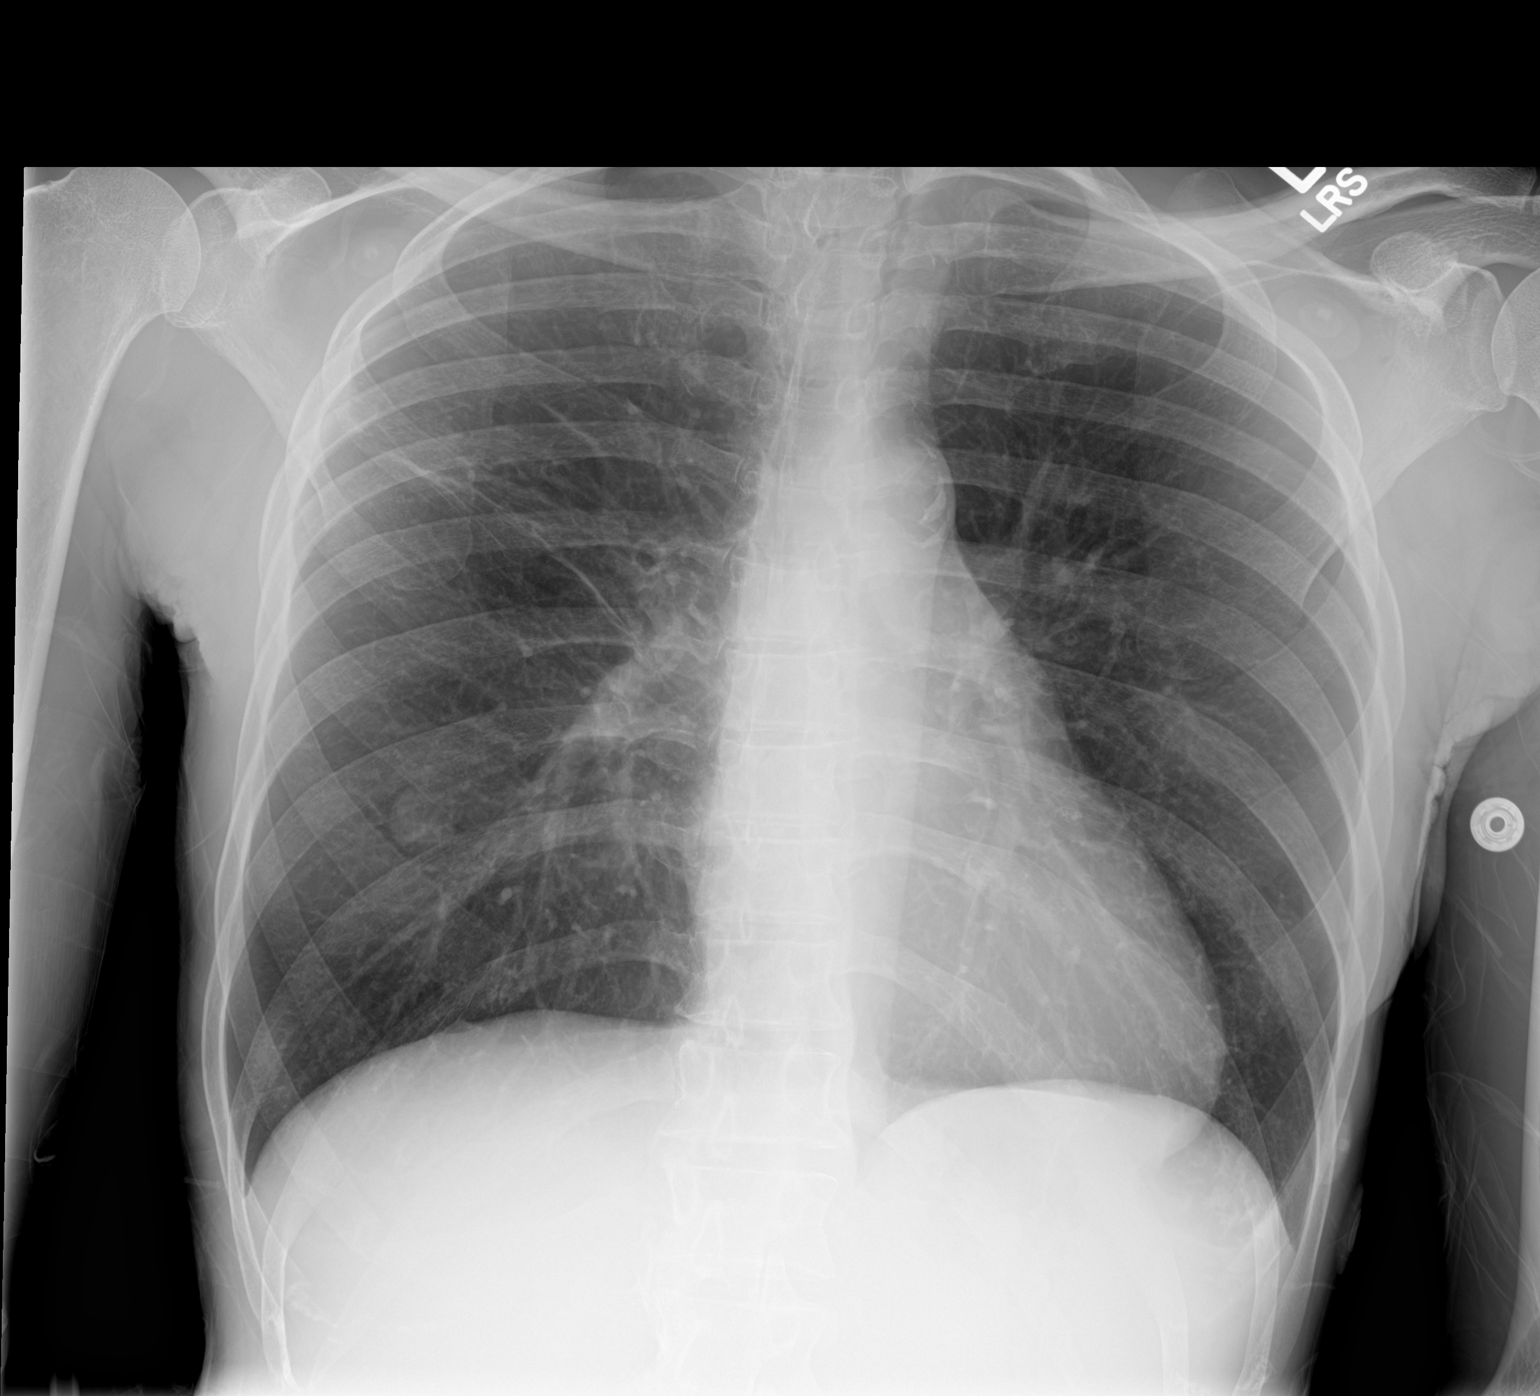

[2 of 2 positions shown; findings below may reference images not displayed]

FINDINGS: The heart size and mediastinal contours are within normal limits.
There is apparent lung tissue anterior to the mediastinal on the
lateral projection, likely related to patient positioning. Both
lungs are clear. The visualized skeletal structures are
unremarkable.
IMPRESSION: No active cardiopulmonary disease.

## 2017-09-10 ENCOUNTER — Encounter (HOSPITAL_BASED_OUTPATIENT_CLINIC_OR_DEPARTMENT_OTHER): Payer: Medicaid Other | Attending: Internal Medicine

## 2017-09-14 ENCOUNTER — Emergency Department (HOSPITAL_COMMUNITY)
Admission: EM | Admit: 2017-09-14 | Discharge: 2017-09-14 | Disposition: A | Discharge status: Home / Self Care | Payer: Medicaid Other | Attending: Emergency Medicine | Admitting: Emergency Medicine

## 2017-09-14 ENCOUNTER — Emergency Department (HOSPITAL_COMMUNITY): Payer: Medicaid Other

## 2017-09-14 ENCOUNTER — Encounter (HOSPITAL_COMMUNITY): Payer: Self-pay | Admitting: Emergency Medicine

## 2017-09-14 ENCOUNTER — Other Ambulatory Visit: Payer: Self-pay

## 2017-09-14 DIAGNOSIS — R05 Cough: Secondary | ICD-10-CM | POA: Diagnosis present

## 2017-09-14 DIAGNOSIS — F17210 Nicotine dependence, cigarettes, uncomplicated: Secondary | ICD-10-CM | POA: Diagnosis not present

## 2017-09-14 DIAGNOSIS — R059 Cough, unspecified: Secondary | ICD-10-CM

## 2017-09-14 DIAGNOSIS — R0789 Other chest pain: Secondary | ICD-10-CM

## 2017-09-14 DIAGNOSIS — I10 Essential (primary) hypertension: Secondary | ICD-10-CM | POA: Diagnosis not present

## 2017-09-14 DIAGNOSIS — Z79899 Other long term (current) drug therapy: Secondary | ICD-10-CM | POA: Insufficient documentation

## 2017-09-14 LAB — BASIC METABOLIC PANEL
Anion gap: 8 (ref 5–15)
BUN: 8 mg/dL (ref 6–20)
CHLORIDE: 104 mmol/L (ref 101–111)
CO2: 26 mmol/L (ref 22–32)
Calcium: 9.2 mg/dL (ref 8.9–10.3)
Creatinine, Ser: 0.52 mg/dL (ref 0.44–1.00)
GFR calc Af Amer: >60 mL/min (ref >60)
GFR calc non Af Amer: >60 mL/min (ref >60)
Glucose, Bld: 85 mg/dL (ref 65–99)
POTASSIUM: 3 mmol/L — AB (ref 3.5–5.1)
SODIUM: 138 mmol/L (ref 135–145)

## 2017-09-14 LAB — CBC
HCT: 33.4 % — ABNORMAL LOW (ref 36.0–46.0)
Hemoglobin: 10.8 g/dL — ABNORMAL LOW (ref 12.0–15.0)
MCH: 26.5 pg (ref 26.0–34.0)
MCHC: 32.3 g/dL (ref 30.0–36.0)
MCV: 82.1 fL (ref 78.0–100.0)
Platelets: 384 10*3/uL (ref 150–400)
RBC: 4.07 MIL/uL (ref 3.87–5.11)
RDW: 15 % (ref 11.5–15.5)
WBC: 5.4 10*3/uL (ref 4.0–10.5)

## 2017-09-14 LAB — I-STAT TROPONIN, ED
Troponin i, poc: 0 ng/mL (ref 0.00–0.08)
Troponin i, poc: 0 ng/mL (ref 0.00–0.08)

## 2017-09-14 LAB — I-STAT BETA HCG BLOOD, ED (MC, WL, AP ONLY): I-stat hCG, quantitative: 6.6 m[IU]/mL — ABNORMAL HIGH (ref <5)

## 2017-09-14 MED ORDER — AMLODIPINE BESYLATE 10 MG PO TABS: 10.0000 mg | ORAL_TABLET | Freq: Every day | ORAL | 0 refills | Status: DC

## 2017-09-14 MED ORDER — ALBUTEROL SULFATE HFA 108 (90 BASE) MCG/ACT IN AERS
2.0000 | INHALATION_SPRAY | Freq: Once | RESPIRATORY_TRACT | Status: AC
  Administered 2017-09-14: 2 via RESPIRATORY_TRACT
  Filled 2017-09-14: qty 6.7

## 2017-09-14 MED ORDER — BENZONATATE 100 MG PO CAPS: 100.0000 mg | ORAL_CAPSULE | Freq: Three times a day (TID) | ORAL | 0 refills | Status: DC

## 2017-09-14 MED ORDER — POTASSIUM CHLORIDE CRYS ER 20 MEQ PO TBCR
60.0000 meq | EXTENDED_RELEASE_TABLET | Freq: Once | ORAL | Status: AC
  Administered 2017-09-14: 60 meq via ORAL
  Filled 2017-09-14: qty 3

## 2017-09-14 NOTE — Discharge Instructions (Signed)
Medications: Tessalon, amlodipine  Treatment: Take Tessalon every 8 hours as needed for cough.  Use albuterol inhaler every 4-6 hours as needed for shortness of breath or wheezing, or chest tightness.  Resume taking amlodipine.  Follow-up: Please follow-up with your primary care provider in 2-3 days for recheck.  Please return to emergency department if you develop any new or worsening symptoms.

## 2017-09-14 NOTE — ED Provider Notes (Signed)
[facility] EMERGENCY DEPARTMENT Provider Note   CSN: [id number] Arrival date & time: 09/14/17  0547     History   Chief Complaint Chief Complaint  Patient presents with  . Chest Pain  . Hypertension    HPI [name] is a 56 y.o. female with history of cocaine use, hypertension, anemia after double who presents with a 2-week history of productive cough, chest pain, and shortness of breath.  Patient reports that she has chest pain that she describes as a pressure, however it is much worse when she coughs.  She occasionally has pleuritic pain.  She occasionally has some shortness of breath, but none at this time.  She reports she has been coughing up green sputum.  She denies any history of blood clots.  She is wheelchair-bound BKA which she states are healing well.  She is not taking any medications at home for her symptoms.  She reports she has been out of her lisinopril for her blood pressure for a while.  She reports subjective intermittent fever and chills at home.  She has had associated nasal congestion and sore throat.  Patient smokes cigarettes and occasionally smokes cocaine.  Last use 2 weeks ago.  She denies any abdominal pain, nausea, vomiting.  She has had some dysuria lately.  Patient was given 324 mg of aspirin and 2 nitroglycerin tablets prior to arrival, which she states helped her pain a little.  HPI  Past Medical History:  Diagnosis Date  . CAP (community acquired pneumonia) 03/2014 X 2  . Cocaine abuse ([place])    ongoing with resultant vaculitis.  . Depression   . Headache    "weekly" (07/29/2016)  . Hypertension   . Inflammatory arthritis   . Migraines    "probably 5-6/yr" (07/29/2016)  . Normocytic anemia    BL Hgb 9.8-12. Last anemia panel 04/2010 - showing Fe 19, ferritin 101.  Pt on monthly B12 injections  . Rheumatoid arthritis(714.0)    patient reported  . 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 Followed by Dr. Louanne Skye    Patient Active Problem List   Diagnosis Date Noted  . 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 Hypertensive crisis   . Phantom limb pain ([place])   . S/P bilateral below knee amputation ([place]) 04/13/2017  . Tobacco abuse   . Post-operative pain   . Acute blood loss anemia   . 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 Wound infection 03/30/2017  . AKI (acute kidney injury) ([place]) 02/07/2017  . Acute kidney injury ([place]) 02/06/2017  . Wound healing, delayed   . Chest pain 04/07/2015  . Protein-calorie malnutrition, severe ([place]) 01/15/2015  . MDD (major depressive disorder), recurrent episode, severe ([place]) 10/16/2014  . Cocaine use disorder, severe, dependence ([place]) 10/10/2014  . 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 Essential hypertension 02/26/2010    Past Surgical History:  Procedure Laterality Date  . AMPUTATION Left 05/22/2016   Procedure: AMPUTATION LEFT LONG FINGER;  Surgeon: Marybelle Killings, MD;  Location: [facility];  Service: Orthopedics;  Laterality: Left;  . AMPUTATION Bilateral 04/10/2017   Procedure: AMPUTATION BELOW KNEE;  Surgeon: Newt Minion, MD;  Location: [facility];  Service: Orthopedics;  Laterality: Bilateral;  . HERNIA REPAIR     "stomach"  . I&D EXTREMITY Right 09/26/2015   Procedure: IRRIGATION AND DEBRIDEMENT LEG WOUND  [place].;  Surgeon: Loel Lofty Dillingham, DO;  Location: [facility];  Service: Plastics;  Laterality: Right;  . INCISION AND DRAINAGE OF WOUND Bilateral 10/20/2016   Procedure: IRRIGATION AND DEBRIDEMENT WOUND BILATERAL;  Surgeon: Edrick Kins, DPM;  Location: [facility];  Service: Podiatry;  Laterality: Bilateral;  . IRRIGATION AND DEBRIDEMENT ABSCESS Bilateral 09/26/2013   Procedure: DEBRIDEMENT ULCERS BILATERAL THIGHS;  Surgeon: Gwenyth Ober, MD;  Location: [facility];  Service: General;  Laterality: Bilateral;  . SKIN BIOPSY  Bilateral    shin nodules    OB History    No data available       Home Medications    Prior to Admission medications   Medication Sig Start Date End Date Taking? Authorizing Provider  amLODipine (NORVASC) 10 MG tablet Take 1 tablet (10 mg total) by mouth daily. 09/14/17   Aliviya Schoeller, Bea Graff, PA-C  benzonatate (TESSALON) 100 MG capsule Take 1 capsule (100 mg total) by mouth every 8 (eight) hours. 09/14/17   Emmersen Garraway, Bea Graff, PA-C  busPIRone (BUSPAR) 15 MG tablet Take 1 tablet (15 mg total) by mouth 3 (three) times daily. Patient not taking: Reported on 08/09/2017 07/26/17   Scot Jun, FNP  cyclobenzaprine (FLEXERIL) 5 MG tablet Take 1 tablet (5 mg total) by mouth 3 (three) times daily as needed (muscle soreness). Patient not taking: Reported on 08/09/2017 07/04/17   Rolland Porter, MD  escitalopram (LEXAPRO) 10 MG tablet Take 2 tablets (20 mg total) by mouth at bedtime. Patient not taking: Reported on 08/09/2017 07/26/17   Scot Jun, FNP  gabapentin (NEURONTIN) 600 MG tablet Take 1 tablet (600 mg total) by mouth 3 (three) times daily. Patient not taking: Reported on 08/09/2017 07/26/17   Scot Jun, FNP  hydrochlorothiazide (HYDRODIURIL) 25 MG tablet Take 1 tablet (25 mg total) daily by mouth. Patient not taking: Reported on 09/14/2017 08/11/17   Dorie Rank, MD  naproxen (NAPROSYN) 250 MG tablet Take 1 tablet (250 mg total) by mouth 2 (two) times daily. Patient not taking: Reported on 08/09/2017 07/04/17   Rolland Porter, MD  potassium chloride (K-DUR) 10 MEQ tablet Take 1 tablet (10 mEq total) daily by mouth. Patient not taking: Reported on 09/14/2017 08/11/17   Dorie Rank, MD  sulfamethoxazole-trimethoprim (BACTRIM DS,SEPTRA DS) 800-160 MG tablet Take 1 tablet by mouth 2 (two) times daily. Patient not taking: Reported on 08/09/2017 08/03/17   Scot Jun, FNP  traMADol (ULTRAM) 50 MG tablet Take 2 tablets (100 mg total) by mouth every 12 (twelve) hours as needed for  severe pain. Patient not taking: Reported on 08/12/2017 07/26/17   Scot Jun, FNP    Family History Family History  Problem Relation Age of Onset  . Breast cancer Mother        Breast cancer  . Alcohol abuse Mother   . Colon cancer Maternal Aunt 22  . Alcohol abuse Father     Social History Social History   Tobacco Use  . Smoking status: Current Every Day Smoker    Packs/day: 0.12    Years: 38.00    Pack years: 4.56    Types: Cigarettes  . Smokeless tobacco: Never Used  . Tobacco comment: 2 A DAY  Substance Use Topics  . Alcohol use: No    Alcohol/week: 0.0 oz  . Drug use: Yes    Types: "Crack" cocaine, Cocaine    Comment: Smoked crack 06/02/2017     Allergies   Acetaminophen and Other   Review  of Systems Review of Systems  Constitutional: Positive for chills and fever.  HENT: Positive for congestion and sore throat. Negative for facial swelling.   Respiratory: Positive for cough and shortness of breath.   Cardiovascular: Positive for chest pain.  Gastrointestinal: Negative for abdominal pain, nausea and vomiting.  Genitourinary: Positive for dysuria.  Musculoskeletal: Negative for back pain.  Skin: Negative for rash and wound.  Neurological: Negative for headaches.  Psychiatric/Behavioral: The patient is not nervous/anxious.      Physical Exam Updated Vital Signs BP (!) 171/84 (BP Location: Right Arm) Comment: Simultaneous filing. User may not have seen previous data.  Pulse 72 Comment: Simultaneous filing. User may not have seen previous data.  Temp 98.1 F (36.7 C) (Oral)   Resp 16 Comment: Simultaneous filing. User may not have seen previous data.  Wt 40.8 kg (90 lb)   SpO2 100% Comment: Simultaneous filing. User may not have seen previous data.  BMI 16.73 kg/m   Physical Exam  Constitutional: She appears well-developed and well-nourished. No distress.  HENT:  Head: Normocephalic and atraumatic.  Mouth/Throat: Oropharynx is clear and  moist. No oropharyngeal exudate.  Eyes: Conjunctivae are normal. Pupils are equal, round, and reactive to light. Right eye exhibits no discharge. Left eye exhibits no discharge. No scleral icterus.  Neck: Normal range of motion. Neck supple. No thyromegaly present.  Cardiovascular: Normal rate, regular rhythm, normal heart sounds and intact distal pulses. Exam reveals no gallop and no friction rub.  No murmur heard. Pulmonary/Chest: Effort normal. No stridor. No respiratory distress. She has no wheezes. She has rhonchi. She has no rales. She exhibits tenderness.  Abdominal: Soft. Bowel sounds are normal. She exhibits no distension. There is no tenderness. There is no rebound and no guarding.  Genitourinary:  Genitourinary Comments: Depend in place  Musculoskeletal: She exhibits no edema.  Lymphadenopathy:    She has no cervical adenopathy.  Neurological: She is alert. Coordination normal.  Skin: Skin is warm and dry. No rash noted. She is not diaphoretic. No pallor.  Psychiatric: She has a normal mood and affect.  Nursing note and vitals reviewed.    ED Treatments / Results  Labs (all labs ordered are listed, but only abnormal results are displayed) Labs Reviewed  BASIC METABOLIC PANEL - Abnormal; Notable for the following components:      Result Value   Potassium 3.0 (*)    All other components within normal limits  CBC - Abnormal; Notable for the following components:   Hemoglobin 10.8 (*)    HCT 33.4 (*)    All other components within normal limits  I-STAT BETA HCG BLOOD, ED ([facility], [facility], [facility] ONLY) - Abnormal; Notable for the following components:   I-stat hCG, quantitative 6.6 (*)    All other components within normal limits  I-STAT TROPONIN, ED  I-STAT TROPONIN, ED    EKG  EKG Interpretation  Date/Time:  Tuesday September 14 2017 05:58:28 EST Ventricular Rate:  78 PR Interval:    QRS Duration: 85 QT Interval:  458 QTC Calculation: 522 R Axis:   86 Text Interpretation:   Sinus rhythm Biatrial enlargement Left ventricular hypertrophy Prolonged QT interval Confirmed by Ripley Fraise [phone]) on 09/14/2017 6:54:37 AM Also confirmed by Ripley Fraise [phone]), editor Philomena Doheny [phone])  on 09/14/2017 10:12:17 AM       Radiology Dg Chest 2 View  Result Date: 09/14/2017 CLINICAL DATA:  Acute onset of central chest pain, cough and high blood pressure. EXAM: CHEST  2 VIEW  COMPARISON:  Chest radiograph performed 07/19/2017, and CTA of the chest performed 07/04/2017 FINDINGS: The lungs are well-aerated. The patient's right-sided nipple shadow is again noted. There is no evidence of focal opacification, pleural effusion or pneumothorax. The heart is normal in size; the mediastinal contour is within normal limits. No acute osseous abnormalities are seen. IMPRESSION: No acute cardiopulmonary process seen. Electronically Signed   By: Garald Balding M.D.   On: 09/14/2017 06:57    Procedures Procedures (including critical care time)  Medications Ordered in ED Medications  potassium chloride SA (K-DUR,KLOR-CON) CR tablet 60 mEq (60 mEq Oral Given 09/14/17 0845)  albuterol (PROVENTIL HFA;VENTOLIN HFA) 108 (90 Base) MCG/ACT inhaler 2 puff (2 puffs Inhalation Given 09/14/17 1108)     Initial Impression / Assessment and Plan / ED Course  I have reviewed the triage vital signs and the nursing notes.  Pertinent labs & imaging results that were available during my care of the patient were reviewed by me and considered in my medical decision making (see chart for details).     Patient with reproducible chest pain and cough for 2 weeks.  Chest x-ray is negative, however associated URI symptoms lead to most likely musculoskeletal pain from coughing.  Also troponin is negative.  Low risk for PE at this time.  Potassium replaced in the ED, initially 3.  Patient with stable chronic anemia, hemoglobin 10.8.  Patient has not had her blood pressure medication lately.  Will refill  amlodipine 10 mg, per chart review.  Patient initially said lisinopril, however when questioned, she does believe they have changed it recently.  Will discharge home with symptomatic treatment with Tessalon.  Follow-up to PCP in 2-3 days.  Return precautions discussed.  Patient understands and agrees with plan.  Patient also evaluated by Dr. Christy Gentles who guided the patient's management and agrees with plan.  Patient discharged in satisfactory condition.  Final Clinical Impressions(s) / ED Diagnoses   Final diagnoses:  Chest wall pain  Cough    ED Discharge Orders        Ordered    benzonatate (TESSALON) 100 MG capsule  Every 8 hours     09/14/17 1059    amLODipine (NORVASC) 10 MG tablet  Daily     09/14/17 [address], [place] 09/14/17 1321    Ripley Fraise, MD 09/14/17 2323

## 2017-09-14 NOTE — ED Notes (Signed)
Requested transportation [place] residence in [place] w/ no special equipment needed except for transport needed for wheelchair.
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
## 2017-09-14 NOTE — ED Provider Notes (Signed)
Patient seen/examined in the Emergency Department in conjunction with [facility] Patient reports cough, shortness of breath, chest pain Exam : Awake alert, no distress, chest wall is tender to palpation, lungs are clear bilaterally Plan: Imaging pending at this time Patient reports mostly having cough with green sputum, and has chest wall tenderness with palpation, I doubt PE at this time   Ripley Fraise, MD 09/14/17 [phone]

## 2017-10-09 ENCOUNTER — Emergency Department (HOSPITAL_COMMUNITY): Payer: Medicaid Other

## 2017-10-09 ENCOUNTER — Other Ambulatory Visit: Payer: Self-pay

## 2017-10-09 ENCOUNTER — Encounter (HOSPITAL_COMMUNITY): Payer: Self-pay | Admitting: Emergency Medicine

## 2017-10-09 ENCOUNTER — Emergency Department (HOSPITAL_COMMUNITY)
Admission: EM | Admit: 2017-10-09 | Discharge: 2017-10-10 | Disposition: A | Discharge status: Home / Self Care | Payer: Medicaid Other | Attending: Emergency Medicine | Admitting: Emergency Medicine

## 2017-10-09 DIAGNOSIS — I10 Essential (primary) hypertension: Secondary | ICD-10-CM | POA: Insufficient documentation

## 2017-10-09 DIAGNOSIS — R0789 Other chest pain: Secondary | ICD-10-CM | POA: Diagnosis not present

## 2017-10-09 DIAGNOSIS — F17210 Nicotine dependence, cigarettes, uncomplicated: Secondary | ICD-10-CM | POA: Insufficient documentation

## 2017-10-09 DIAGNOSIS — R079 Chest pain, unspecified: Secondary | ICD-10-CM | POA: Diagnosis present

## 2017-10-09 DIAGNOSIS — B029 Zoster without complications: Secondary | ICD-10-CM | POA: Insufficient documentation

## 2017-10-09 DIAGNOSIS — Z79899 Other long term (current) drug therapy: Secondary | ICD-10-CM | POA: Insufficient documentation

## 2017-10-09 LAB — I-STAT BETA HCG BLOOD, ED (MC, WL, AP ONLY): I-stat hCG, quantitative: <5 m[IU]/mL (ref <5)

## 2017-10-09 LAB — BASIC METABOLIC PANEL
ANION GAP: 11 (ref 5–15)
BUN: 14 mg/dL (ref 6–20)
CHLORIDE: 100 mmol/L — AB (ref 101–111)
CO2: 23 mmol/L (ref 22–32)
CREATININE: 0.67 mg/dL (ref 0.44–1.00)
Calcium: 9.5 mg/dL (ref 8.9–10.3)
GFR calc Af Amer: >60 mL/min (ref >60)
GFR calc non Af Amer: >60 mL/min (ref >60)
Glucose, Bld: 122 mg/dL — ABNORMAL HIGH (ref 65–99)
POTASSIUM: 3.3 mmol/L — AB (ref 3.5–5.1)
SODIUM: 134 mmol/L — AB (ref 135–145)

## 2017-10-09 LAB — CBC
HCT: 41.6 % (ref 36.0–46.0)
Hemoglobin: 13.7 g/dL (ref 12.0–15.0)
MCH: 27.4 pg (ref 26.0–34.0)
MCHC: 32.9 g/dL (ref 30.0–36.0)
MCV: 83.2 fL (ref 78.0–100.0)
PLATELETS: 314 10*3/uL (ref 150–400)
RBC: 5 MIL/uL (ref 3.87–5.11)
RDW: 16 % — ABNORMAL HIGH (ref 11.5–15.5)
WBC: 8.4 10*3/uL (ref 4.0–10.5)

## 2017-10-09 LAB — D-DIMER, QUANTITATIVE: D-Dimer, Quant: 8.65 ug/mL-FEU — ABNORMAL HIGH (ref 0.00–0.50)

## 2017-10-09 LAB — I-STAT TROPONIN, ED: Troponin i, poc: 0 ng/mL (ref 0.00–0.08)

## 2017-10-09 MED ORDER — LORAZEPAM 2 MG/ML IJ SOLN
1.0000 mg | Freq: Once | INTRAMUSCULAR | Status: AC
  Administered 2017-10-09: 1 mg via INTRAVENOUS
  Filled 2017-10-09: qty 1

## 2017-10-09 MED ORDER — IOPAMIDOL (ISOVUE-370) INJECTION 76%
INTRAVENOUS | Status: AC
  Administered 2017-10-09: 100 mL
  Filled 2017-10-09: qty 100

## 2017-10-09 MED ORDER — ACYCLOVIR 200 MG PO CAPS
800.0000 mg | ORAL_CAPSULE | Freq: Once | ORAL | Status: DC
  Filled 2017-10-09: qty 4

## 2017-10-09 MED ORDER — MUPIROCIN CALCIUM 2 % EX CREA
TOPICAL_CREAM | Freq: Three times a day (TID) | CUTANEOUS | Status: DC
  Administered 2017-10-09: 22:00:00 via TOPICAL
  Filled 2017-10-09: qty 15

## 2017-10-09 MED ORDER — ACYCLOVIR 800 MG PO TABS
800.0000 mg | ORAL_TABLET | Freq: Once | ORAL | Status: AC
  Administered 2017-10-09: 800 mg via ORAL
  Filled 2017-10-09: qty 1

## 2017-10-09 NOTE — ED Notes (Signed)
Returned from xray

## 2017-10-09 NOTE — ED Notes (Signed)
Patient transported to X-ray 

## 2017-10-09 NOTE — ED Notes (Signed)
Patient transported to CT 

## 2017-10-09 NOTE — ED Notes (Signed)
EMT currently collecting labs.

## 2017-10-09 NOTE — ED Notes (Signed)
Blue top tube collected by [employer].   Handwritten label sent to main lab by phleb kmat.

## 2017-10-09 NOTE — ED Provider Notes (Signed)
[facility] EMERGENCY DEPARTMENT Provider Note   CSN: [id number] Arrival date & time: 10/09/17  1953     History   Chief Complaint Chief Complaint  Patient presents with  . Chest Pain    HPI [name] is a 57 y.o. female.  57yo F w/ PMH inlcuding HTN, migraines, cocaine abuse, who p/w chest pain. She reports that around noon today, she was awakened from sleep by Constant, achy, non-radiating chest pain with associated SOB, nausea. No diaphoresis. She received 396m ASA by EMS. She reports hurting all over. She has had a cough x 1 week, no known fevers. No vomiting.  Last use of crack cocaine was a few days ago, none today. She reports painful, itchy rash on L axilla for the past 2-3 days for which she is concerned is shingles.    The history is provided by the patient.  Chest Pain      Past Medical History:  Diagnosis Date  . CAP (community acquired pneumonia) 03/2014 X 2  . Cocaine abuse ([facility]    ongoing with resultant vaculitis.  . Depression   . Headache    "weekly" (07/29/2016)  . Hypertension   . Inflammatory arthritis   . Migraines    "probably 5-6/yr" (07/29/2016)  . Normocytic anemia    BL Hgb 9.8-12. Last anemia panel 04/2010 - showing Fe 19, ferritin 101.  Pt on monthly B12 injections  . Rheumatoid arthritis(714.0)    patient reported  . 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 Followed by Dr. NLouanne Skye   Patient Active Problem List   Diagnosis Date Noted  . Cocaine-induced mood disorder with depressive symptoms ([facility] 08/10/2017  . Hypertensive crisis   . Phantom limb pain ([facility]   . S/P bilateral below knee amputation ([facility] 04/13/2017  . Tobacco abuse   . Post-operative pain   . Acute blood loss anemia   . Atherosclerosis of native arteries of extremities with gangrene, left leg ([place]   . Wound infection 03/30/2017  . AKI (acute kidney injury) ([facility] 02/07/2017  . Acute kidney  injury ([place] 02/06/2017  . Wound healing, delayed   . Chest pain 04/07/2015  . Protein-calorie malnutrition, severe ([facility] 01/15/2015  . MDD (major depressive disorder), recurrent episode, severe ([place] 10/16/2014  . Cocaine use disorder, severe, dependence ([place] 10/10/2014  . Cocaine-induced vascular disorder ([facility] [place] 06/19/2013  . Essential hypertension 02/26/2010    Past Surgical History:  Procedure Laterality Date  . AMPUTATION Left 05/22/2016   Procedure: AMPUTATION LEFT LONG FINGER;  Surgeon: MMarybelle Killings MD;  [place] [place]  Service: Orthopedics;  Laterality: Left;  . AMPUTATION Bilateral 04/10/2017   Procedure: AMPUTATION BELOW KNEE;  Surgeon: DNewt Minion MD;  [place]  Service: [place]opedics;  Laterality: Bilateral;  . HERNIA REPAIR     "stomach"  . I&D EXTREMITY Right 09/26/2015   Procedure: IRRIGATION AND DEBRIDEMENT LEG WOUND  VAC PLACEMENT.;  Surgeon: CLoel LoftyDillingham, DO;  [place]  Service: Plastics;  Laterality: Right;  . INCISION AND DRAINAGE OF WOUND Bilateral 10/20/2016   Procedure: IRRIGATION AND DEBRIDEMENT WOUND BILATERAL;  Surgeon: BEdrick Kins DPM;  [place] [facility]  Service: Podiatry;  Laterality: Bilateral;  . IRRIGATION AND DEBRIDEMENT ABSCESS Bilateral 09/26/2013   Procedure: DEBRIDEMENT ULCERS BILATERAL THIGHS;  Surgeon: JGwenyth Ober MD;  [place] [facility]  Service: General;  Laterality: Bilateral;  . SKIN BIOPSY Bilateral  shin nodules    OB History    No data available       Home Medications    Prior to Admission medications   Medication Sig Start Date End Date Taking? Authorizing Provider  amLODipine (NORVASC) 10 MG tablet Take 1 tablet (10 mg total) by mouth daily. Patient not taking: Reported on 10/09/2017 09/14/17   Frederica Kuster, PA-C  busPIRone (BUSPAR) 15 MG tablet Take 1 tablet (15 mg total) by mouth 3 (three) times daily. Patient not taking: Reported on 08/09/2017 07/26/17   Scot Jun, FNP  cyclobenzaprine  (FLEXERIL) 5 MG tablet Take 1 tablet (5 mg total) by mouth 3 (three) times daily as needed (muscle soreness). Patient not taking: Reported on 08/09/2017 07/04/17   Rolland Porter, MD  escitalopram (LEXAPRO) 10 MG tablet Take 2 tablets (20 mg total) by mouth at bedtime. Patient not taking: Reported on 08/09/2017 07/26/17   Scot Jun, FNP  gabapentin (NEURONTIN) 600 MG tablet Take 1 tablet (600 mg total) by mouth 3 (three) times daily. Patient not taking: Reported on 08/09/2017 07/26/17   Scot Jun, FNP  hydrochlorothiazide (HYDRODIURIL) 25 MG tablet Take 1 tablet (25 mg total) daily by mouth. Patient not taking: Reported on 09/14/2017 08/11/17   Dorie Rank, MD  potassium chloride (K-DUR) 10 MEQ tablet Take 1 tablet (10 mEq total) daily by mouth. Patient not taking: Reported on 09/14/2017 08/11/17   Dorie Rank, MD    Family History Family History  Problem Relation Age of Onset  . Breast cancer Mother        Breast cancer  . Alcohol abuse Mother   . Colon cancer Maternal Aunt 68  . Alcohol abuse Father     Social History Social History   Tobacco Use  . Smoking status: Current Every Day Smoker    Packs/day: 0.12    Years: 38.00    Pack years: 4.56    Types: Cigarettes  . Smokeless tobacco: Never Used  . Tobacco comment: 2 A DAY  Substance Use Topics  . Alcohol use: No    Alcohol/week: 0.0 oz  . Drug use: Yes    Types: "Crack" cocaine, Cocaine    Comment: Smoked crack 06/02/2017     Allergies   Acetaminophen and Other   Review of Systems Review of Systems  Cardiovascular: Positive for chest pain.   All other systems reviewed and are negative except that which was mentioned in HPI   Physical Exam Updated Vital Signs BP (!) 171/91   Pulse (!) 112   Temp 98.8 F (37.1 C) (Oral)   Resp (!) 24   SpO2 100%   Physical Exam  Constitutional: She is oriented to person, place, and time. She appears well-developed. She appears distressed.  Thin, chronically ill  appearing, moaning and crying  HENT:  Head: Normocephalic and atraumatic.  Moist mucous membranes  Eyes: Conjunctivae are normal.  Neck: Neck supple.  Cardiovascular: Regular rhythm and normal heart sounds. Tachycardia present.  No murmur heard. Pulmonary/Chest: Effort normal and breath sounds normal.  Abdominal: Soft. Bowel sounds are normal. She exhibits no distension. There is tenderness (mild generalized).  Musculoskeletal: She exhibits no edema.  B/l BKAs with scabs on stumps, no erythema or drainage  Neurological: She is alert and oriented to person, place, and time.  Fluent speech  Skin: Skin is warm and dry. Rash noted.  L axilla: multiple scabbed vesicles and a few scattered pustules with a few enlarged lymph nodes, tender to palpation  Psychiatric: She has a normal mood and affect. Judgment normal.  Nursing note and vitals reviewed.    ED Treatments / Results  Labs (all labs ordered are listed, but only abnormal results are displayed) Labs Reviewed  BASIC METABOLIC PANEL - Abnormal; Notable for the following components:      Result Value   Sodium 134 (*)    Potassium 3.3 (*)    Chloride 100 (*)    Glucose, Bld 122 (*)    All other components within normal limits  CBC - Abnormal; Notable for the following components:   RDW 16.0 (*)    All other components within normal limits  D-DIMER, QUANTITATIVE (NOT AT [facility]) - Abnormal; Notable for the following components:   D-Dimer, Quant 8.65 (*)    All other components within normal limits  I-STAT TROPONIN, ED  I-STAT BETA HCG BLOOD, ED ([facility], [facility], [facility] ONLY)  I-STAT TROPONIN, ED    EKG  EKG Interpretation  Date/Time:  Saturday October 09 2017 19:57:50 EST Ventricular Rate:  121 PR Interval:    QRS Duration: 83 QT Interval:  307 QTC Calculation: 436 R Axis:   88 Text Interpretation:  Sinus tachycardia LAE, consider biatrial enlargement Left ventricular hypertrophy tachycardia new from previous Confirmed by Theotis Burrow [phone]) on 10/09/2017 10:01:06 PM       Radiology Dg Chest 2 View  Result Date: 10/09/2017 CLINICAL DATA:  Severe chest pain EXAM: CHEST  2 VIEW COMPARISON:  09/14/2017 FINDINGS: Normal heart size, mediastinal contours, and pulmonary vascularity. Atherosclerotic calcifications aorta. Lungs clear. No pleural effusion or pneumothorax. Bones unremarkable. IMPRESSION: No acute abnormalities. Electronically Signed   By: Lavonia Dana M.D.   On: 10/09/2017 20:33   Ct Angio Chest Pe W/cm &/or Wo Cm  Result Date: 10/09/2017 CLINICAL DATA:  57 y/o F; central chest pain and mild shortness of breath. EXAM: CT ANGIOGRAPHY CHEST WITH CONTRAST TECHNIQUE: Multidetector CT imaging of the chest was performed using the standard protocol during bolus administration of intravenous contrast. Multiplanar CT image reconstructions and MIPs were obtained to evaluate the vascular anatomy. CONTRAST:  152m ISOVUE-370 IOPAMIDOL (ISOVUE-370) INJECTION 76% COMPARISON:  07/04/2017 CT angiogram of the chest. FINDINGS: Cardiovascular: Satisfactory opacification of the pulmonary arteries to the segmental level. No evidence of pulmonary embolism. Normal heart size. No pericardial effusion. Mild calcific atherosclerosis of thoracic aorta. Mediastinum/Nodes: No enlarged mediastinal, hilar, or axillary lymph nodes. Normal thyroid gland and thoracic esophagus. 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 Lungs/Pleura: Lungs are clear. No pleural effusion or pneumothorax. Mild centrilobular emphysema. Upper Abdomen: No acute abnormality. Musculoskeletal: No chest wall abnormality. No acute or significant osseous findings. Review of the MIP images confirms the above findings. IMPRESSION: 1. No pulmonary embolus identified. 2. Mild centrilobular emphysema of the lungs. No acute pulmonary  process identified. 3. Mild aortic atherosclerosis. 4. 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 Electronically Signed   By: LKristine GarbeM.D.   On: 10/09/2017 23:54    Procedures Procedures (including critical care time)  Medications Ordered in ED Medications  mupirocin cream (BACTROBAN) 2 % ( Topical Given 10/09/17 2155)  LORazepam (ATIVAN) injection 1 mg (1 mg Intravenous Given 10/09/17 2158)  acyclovir (ZOVIRAX) tablet 800 mg (800 mg Oral Given 10/09/17 2155)  iopamidol (ISOVUE-370) 76 % injection (100 mLs  Contrast Given 10/09/17 2316)  Initial Impression / Assessment and Plan / ED Course  I have reviewed the triage vital signs and the nursing notes.  Pertinent labs & imaging results that were available during my care of the patient were reviewed by me and considered in my medical decision making (see chart for details).    PT w/ multiple complaints including constant chest pain for several hours today, rash in axilla, and full body pain. She was tachycardic on exam but also upset and crying. L axilla rash c/w shingles, recommended starting acyclovir. She also has several pustules and crusting and has been scratching it thus will also start mupirocin to cover secondary bacterial infection. CXR negative acute, EKG without acute ischemia.  Because of patient's tachycardia and complaints of chest pain, obtain d-dimer which was elevated.  CTA of chest negative for PE or other acute finding to explain her symptoms.  Serial troponin negative.  Her description of symptoms is very atypical especially given that it includes full body pain and pain related to her shingles.  I feel she is safe for discharge and I have discussed outpatient treatment with acyclovir as well as PCP follow-up.  Reviewed return precautions.  Patient voiced understanding and discharged in satisfactory condition.  Resting comfortably on reassessment prior to discharge.  Final Clinical  Impressions(s) / ED Diagnoses   Final diagnoses:  None    ED Discharge Orders    None       Bhakti Labella, Wenda Overland, MD 10/10/17 0045

## 2017-10-09 NOTE — ED Triage Notes (Addendum)
Patient with central chest pain, no radiation.  Patient states that it woke her from sleep, achy in nature.  The pain started at noon today, with some mild shortness of breath.  The pain is constant and has not let up during the day.  Patient was given 352m ASA en route to ED.

## 2017-10-10 LAB — I-STAT TROPONIN, ED: Troponin i, poc: 0.01 ng/mL (ref 0.00–0.08)

## 2017-10-10 MED ORDER — ACYCLOVIR 400 MG PO TABS: 800.0000 mg | ORAL_TABLET | Freq: Every day | ORAL | 0 refills | Status: AC

## 2017-10-17 ENCOUNTER — Other Ambulatory Visit: Payer: Self-pay

## 2017-10-17 ENCOUNTER — Encounter (HOSPITAL_COMMUNITY): Payer: Self-pay | Admitting: Emergency Medicine

## 2017-10-17 ENCOUNTER — Emergency Department (HOSPITAL_COMMUNITY)
Admission: EM | Admit: 2017-10-17 | Discharge: 2017-10-17 | Disposition: A | Discharge status: Home / Self Care | Payer: Medicaid Other | Attending: Emergency Medicine | Admitting: Emergency Medicine

## 2017-10-17 DIAGNOSIS — M79604 Pain in right leg: Secondary | ICD-10-CM

## 2017-10-17 DIAGNOSIS — Z89511 Acquired absence of right leg below knee: Secondary | ICD-10-CM | POA: Diagnosis not present

## 2017-10-17 DIAGNOSIS — G8929 Other chronic pain: Secondary | ICD-10-CM

## 2017-10-17 DIAGNOSIS — M79661 Pain in right lower leg: Secondary | ICD-10-CM | POA: Insufficient documentation

## 2017-10-17 DIAGNOSIS — Z89512 Acquired absence of left leg below knee: Secondary | ICD-10-CM | POA: Diagnosis not present

## 2017-10-17 DIAGNOSIS — F17210 Nicotine dependence, cigarettes, uncomplicated: Secondary | ICD-10-CM | POA: Insufficient documentation

## 2017-10-17 DIAGNOSIS — M79605 Pain in left leg: Secondary | ICD-10-CM

## 2017-10-17 DIAGNOSIS — G546 Phantom limb syndrome with pain: Secondary | ICD-10-CM | POA: Insufficient documentation

## 2017-10-17 DIAGNOSIS — M79662 Pain in left lower leg: Secondary | ICD-10-CM | POA: Diagnosis present

## 2017-10-17 MED ORDER — OXYCODONE HCL 5 MG PO TABS
10.0000 mg | ORAL_TABLET | Freq: Once | ORAL | Status: AC
  Administered 2017-10-17: 10 mg via ORAL
  Filled 2017-10-17: qty 2

## 2017-10-17 NOTE — ED Triage Notes (Signed)
Pt presents by EMS for increasing bilateral leg pain. Pt is BKA bilaterally. EMS reports sores to both stumps.

## 2017-10-17 NOTE — ED Notes (Signed)
Bed: ZB01 Expected date:  Expected time:  Means of arrival:  Comments: EMS bilateral leg pain

## 2017-10-17 NOTE — ED Notes (Signed)
[facility] here for transport of pt back to residence.

## 2017-10-17 NOTE — ED Provider Notes (Signed)
TIME SEEN: 1:50 AM  CHIEF COMPLAINT: Leg pain  HPI: Patient is a 57 year old female with history of hypertension, cocaine abuse, bilateral BKA's who presents emergency department with leg pain.  States she feels like it is phantom limb pain and she feels like there is pain in her feet.  Pain is been present for weeks but acutely worsened tonight.  No injury to the legs.  She also has superficial wounds to these extremities but states this is chronic and they have not worsened.  No purulent drainage.  No surrounding redness or warmth.  No fever.  ROS: See HPI Constitutional: no fever  Eyes: no drainage  ENT: no runny nose   Cardiovascular:  no chest pain  Resp: no SOB  GI: no vomiting GU: no dysuria Integumentary: no rash  Allergy: no hives  Musculoskeletal: no leg swelling  Neurological: no slurred speech ROS otherwise negative  PAST MEDICAL HISTORY/PAST SURGICAL HISTORY:  Past Medical History:  Diagnosis Date  . CAP (community acquired pneumonia) 03/2014 X 2  . Cocaine abuse ([place])    ongoing with resultant vaculitis.  . Depression   . Headache    "weekly" (07/29/2016)  . Hypertension   . Inflammatory arthritis   . Migraines    "probably 5-6/yr" (07/29/2016)  . Normocytic anemia    BL Hgb 9.8-12. Last anemia panel 04/2010 - showing Fe 19, ferritin 101.  Pt on monthly B12 injections  . Rheumatoid arthritis(714.0)    patient reported  . 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 Followed by Dr. Louanne Skye    MEDICATIONS:  Prior to Admission medications   Medication Sig Start Date End Date Taking? Authorizing Provider  acyclovir (ZOVIRAX) 400 MG tablet Take 2 tablets (800 mg total) by mouth 5 (five) times daily for 7 days. 10/10/17 10/17/17  Little, Wenda Overland, MD  amLODipine (NORVASC) 10 MG tablet Take 1 tablet (10 mg total) by mouth daily. Patient not taking: Reported on 10/09/2017 09/14/17   Frederica Kuster, PA-C  busPIRone  (BUSPAR) 15 MG tablet Take 1 tablet (15 mg total) by mouth 3 (three) times daily. Patient not taking: Reported on 08/09/2017 07/26/17   Scot Jun, FNP  cyclobenzaprine (FLEXERIL) 5 MG tablet Take 1 tablet (5 mg total) by mouth 3 (three) times daily as needed (muscle soreness). Patient not taking: Reported on 08/09/2017 07/04/17   Rolland Porter, MD  escitalopram (LEXAPRO) 10 MG tablet Take 2 tablets (20 mg total) by mouth at bedtime. Patient not taking: Reported on 08/09/2017 07/26/17   Scot Jun, FNP  gabapentin (NEURONTIN) 600 MG tablet Take 1 tablet (600 mg total) by mouth 3 (three) times daily. Patient not taking: Reported on 08/09/2017 07/26/17   Scot Jun, FNP  hydrochlorothiazide (HYDRODIURIL) 25 MG tablet Take 1 tablet (25 mg total) daily by mouth. Patient not taking: Reported on 09/14/2017 08/11/17   Dorie Rank, MD  potassium chloride (K-DUR) 10 MEQ tablet Take 1 tablet (10 mEq total) daily by mouth. Patient not taking: Reported on 09/14/2017 08/11/17   Dorie Rank, MD    ALLERGIES:  Allergies  Allergen Reactions  . Acetaminophen Swelling and Other (See Comments)    Reaction:  Eyelid swelling  . Other Other (See Comments)    Pt states she is allergic to a blood pressure medication, but does not know the reaction or medication    SOCIAL HISTORY:  Social History   Tobacco Use  . Smoking status:  Current Every Day Smoker    Packs/day: 0.12    Years: 38.00    Pack years: 4.56    Types: Cigarettes  . Smokeless tobacco: Never Used  . Tobacco comment: 2 A DAY  Substance Use Topics  . Alcohol use: No    Alcohol/week: 0.0 oz    FAMILY HISTORY: Family History  Problem Relation Age of Onset  . Breast cancer Mother        Breast cancer  . Alcohol abuse Mother   . Colon cancer Maternal Aunt 84  . Alcohol abuse Father     EXAM: BP (!) 132/54   Pulse (!) 101   Temp 98.7 F (37.1 C) (Oral)   Resp 20   Ht 5' 1"  (1.549 m)   Wt 44.5 kg (98 lb)   SpO2 100%    BMI 18.52 kg/m  CONSTITUTIONAL: Alert and oriented and responds appropriately to questions.  Chronically ill-appearing, tearful, afebrile HEAD: Normocephalic EYES: Conjunctivae clear, pupils appear equal, EOMI ENT: normal nose; moist mucous membranes NECK: Supple, no meningismus, no nuchal rigidity, no LAD  CARD: RRR; S1 and S2 appreciated; no murmurs, no clicks, no rubs, no gallops RESP: Normal chest excursion without splinting or tachypnea; breath sounds clear and equal bilaterally; no wheezes, no rhonchi, no rales, no hypoxia or respiratory distress, speaking full sentences ABD/GI: Normal bowel sounds; non-distended; soft, non-tender, no rebound, no guarding, no peritoneal signs, no hepatosplenomegaly BACK:  The back appears normal and is non-tender to palpation, there is no CVA tenderness EXT: Patient is status post bilateral BKA.  She has superficial wounds noted to both legs that do not appear to go any deeper than just the skin.  There is no surrounding redness, warmth, induration or fluctuance.  No purulent drainage or odor.  She has no joint effusion on exam.  Her compartments are soft.  Extremities are warm and well perfused.  No sign of bony injury or bony tenderness on exam.  Normal ROM in all joints;  no edema; normal capillary refill; no cyanosis, no calf tenderness or swelling    SKIN: Normal color for age and race; warm; no rash NEURO: Moves all extremities equally PSYCH: The patient's mood and manner are appropriate. Grooming and personal hygiene are appropriate.  MEDICAL DECISION MAKING: Patient here with complaints of chronic pain.  We will clean and dress her wounds and give her 1 dose of oral pain medication here.  Given her history of substance abuse and that this is chronic in nature I do not feel it is appropriate to discharge her with a prescription of narcotics and she is comfortable with this plan.  She will follow-up with her outpatient provider for further management.   I do not feel that she has osteomyelitis, abscess, cellulitis based on exam.  No history of any injury.  Her extremities are warm and well perfused.  At this time, I do not feel there is any life-threatening condition present. I have reviewed and discussed all results (EKG, imaging, lab, urine as appropriate) and exam findings with patient/family. I have reviewed nursing notes and appropriate previous records.  I feel the patient is safe to be discharged home without further emergent workup and can continue workup as an outpatient as needed. Discussed usual and customary return precautions. Patient/family verbalize understanding and are comfortable with this plan.  Outpatient follow-up has been provided if needed. All questions have been answered.      Ward, Delice Bison, DO 10/17/17 [phone]

## 2017-10-17 NOTE — ED Notes (Signed)
[employer] notified of need for transport of pt back to residence.

## 2017-10-17 NOTE — ED Triage Notes (Signed)
Pt has open sores to bilateral knees and dressings were completely soiled with foul odor present.

## 2017-10-23 ENCOUNTER — Other Ambulatory Visit: Payer: Self-pay

## 2017-10-23 ENCOUNTER — Inpatient Hospital Stay (HOSPITAL_COMMUNITY)
Admission: EM | Admit: 2017-10-23 | Discharge: 2017-10-26 | DRG: 918 | Disposition: A | Discharge status: Home / Self Care | Payer: Medicaid Other | Attending: Student in an Organized Health Care Education/Training Program | Admitting: Student in an Organized Health Care Education/Training Program

## 2017-10-23 ENCOUNTER — Emergency Department (HOSPITAL_COMMUNITY): Payer: Medicaid Other

## 2017-10-23 ENCOUNTER — Encounter (HOSPITAL_COMMUNITY): Payer: Self-pay | Admitting: Emergency Medicine

## 2017-10-23 DIAGNOSIS — Z23 Encounter for immunization: Secondary | ICD-10-CM

## 2017-10-23 DIAGNOSIS — L958 Other vasculitis limited to the skin: Secondary | ICD-10-CM | POA: Diagnosis present

## 2017-10-23 DIAGNOSIS — T374X1A Poisoning by anthelminthics, accidental (unintentional), initial encounter: Principal | ICD-10-CM | POA: Diagnosis present

## 2017-10-23 DIAGNOSIS — Z89512 Acquired absence of left leg below knee: Secondary | ICD-10-CM

## 2017-10-23 DIAGNOSIS — Z89511 Acquired absence of right leg below knee: Secondary | ICD-10-CM

## 2017-10-23 DIAGNOSIS — L97909 Non-pressure chronic ulcer of unspecified part of unspecified lower leg with unspecified severity: Secondary | ICD-10-CM | POA: Diagnosis present

## 2017-10-23 DIAGNOSIS — L089 Local infection of the skin and subcutaneous tissue, unspecified: Secondary | ICD-10-CM

## 2017-10-23 DIAGNOSIS — Z803 Family history of malignant neoplasm of breast: Secondary | ICD-10-CM

## 2017-10-23 DIAGNOSIS — T8789 Other complications of amputation stump: Secondary | ICD-10-CM

## 2017-10-23 DIAGNOSIS — I999 Unspecified disorder of circulatory system: Secondary | ICD-10-CM

## 2017-10-23 DIAGNOSIS — R509 Fever, unspecified: Secondary | ICD-10-CM | POA: Diagnosis present

## 2017-10-23 DIAGNOSIS — T8781 Dehiscence of amputation stump: Secondary | ICD-10-CM | POA: Diagnosis present

## 2017-10-23 DIAGNOSIS — D649 Anemia, unspecified: Secondary | ICD-10-CM | POA: Diagnosis present

## 2017-10-23 DIAGNOSIS — I10 Essential (primary) hypertension: Secondary | ICD-10-CM | POA: Diagnosis present

## 2017-10-23 DIAGNOSIS — E876 Hypokalemia: Secondary | ICD-10-CM | POA: Diagnosis present

## 2017-10-23 DIAGNOSIS — T148XXA Other injury of unspecified body region, initial encounter: Secondary | ICD-10-CM

## 2017-10-23 DIAGNOSIS — F1410 Cocaine abuse, uncomplicated: Secondary | ICD-10-CM | POA: Diagnosis present

## 2017-10-23 DIAGNOSIS — F14988 Cocaine use, unspecified with other cocaine-induced disorder: Secondary | ICD-10-CM | POA: Diagnosis present

## 2017-10-23 LAB — CBC WITH DIFFERENTIAL/PLATELET
Basophils Absolute: 0 10*3/uL (ref 0.0–0.1)
Basophils Relative: 0 %
Eosinophils Absolute: 0 10*3/uL (ref 0.0–0.7)
Eosinophils Relative: 0 %
HCT: 33.4 % — ABNORMAL LOW (ref 36.0–46.0)
Hemoglobin: 11.1 g/dL — ABNORMAL LOW (ref 12.0–15.0)
Lymphocytes Relative: 36 %
Lymphs Abs: 1.2 10*3/uL (ref 0.7–4.0)
MCH: 26.7 pg (ref 26.0–34.0)
MCHC: 33.2 g/dL (ref 30.0–36.0)
MCV: 80.5 fL (ref 78.0–100.0)
Monocytes Absolute: 0.4 10*3/uL (ref 0.1–1.0)
Monocytes Relative: 11 %
Neutro Abs: 1.7 10*3/uL (ref 1.7–7.7)
Neutrophils Relative %: 53 %
Platelets: 409 10*3/uL — ABNORMAL HIGH (ref 150–400)
RBC: 4.15 MIL/uL (ref 3.87–5.11)
RDW: 15.1 % (ref 11.5–15.5)
WBC: 3.3 10*3/uL — ABNORMAL LOW (ref 4.0–10.5)

## 2017-10-23 LAB — CBC
HCT: 35.6 % — ABNORMAL LOW (ref 36.0–46.0)
HEMOGLOBIN: 11.7 g/dL — AB (ref 12.0–15.0)
MCH: 26.9 pg (ref 26.0–34.0)
MCHC: 32.9 g/dL (ref 30.0–36.0)
MCV: 81.8 fL (ref 78.0–100.0)
Platelets: 348 10*3/uL (ref 150–400)
RBC: 4.35 MIL/uL (ref 3.87–5.11)
RDW: 15 % (ref 11.5–15.5)
WBC: 3.1 10*3/uL — AB (ref 4.0–10.5)

## 2017-10-23 LAB — BASIC METABOLIC PANEL
Anion gap: 11 (ref 5–15)
BUN: 13 mg/dL (ref 6–20)
CO2: 20 mmol/L — ABNORMAL LOW (ref 22–32)
Calcium: 8.8 mg/dL — ABNORMAL LOW (ref 8.9–10.3)
Chloride: 103 mmol/L (ref 101–111)
Creatinine, Ser: 0.72 mg/dL (ref 0.44–1.00)
GFR calc Af Amer: >60 mL/min (ref >60)
GFR calc non Af Amer: >60 mL/min (ref >60)
Glucose, Bld: 88 mg/dL (ref 65–99)
Potassium: 3.3 mmol/L — ABNORMAL LOW (ref 3.5–5.1)
Sodium: 134 mmol/L — ABNORMAL LOW (ref 135–145)

## 2017-10-23 LAB — I-STAT TROPONIN, ED: Troponin i, poc: 0.01 ng/mL (ref 0.00–0.08)

## 2017-10-23 LAB — MRSA PCR SCREENING: MRSA by PCR: NEGATIVE

## 2017-10-23 LAB — CREATININE, SERUM: CREATININE: 0.69 mg/dL (ref 0.44–1.00)

## 2017-10-23 LAB — I-STAT CG4 LACTIC ACID, ED: Lactic Acid, Venous: 0.6 mmol/L (ref 0.5–1.9)

## 2017-10-23 MED ORDER — ONDANSETRON HCL 4 MG PO TABS: 4.0000 mg | ORAL_TABLET | Freq: Four times a day (QID) | ORAL | Status: DC | PRN

## 2017-10-23 MED ORDER — VANCOMYCIN HCL IN DEXTROSE 1-5 GM/200ML-% IV SOLN
1000.0000 mg | Freq: Once | INTRAVENOUS | Status: AC
  Administered 2017-10-23: 1000 mg via INTRAVENOUS
  Filled 2017-10-23: qty 200

## 2017-10-23 MED ORDER — OXYCODONE HCL 5 MG PO TABS
10.0000 mg | ORAL_TABLET | ORAL | Status: DC | PRN
  Administered 2017-10-23 – 2017-10-25 (×6): 10 mg via ORAL
  Filled 2017-10-23 (×6): qty 2

## 2017-10-23 MED ORDER — INFLUENZA VAC SPLIT QUAD 0.5 ML IM SUSY
0.5000 mL | PREFILLED_SYRINGE | INTRAMUSCULAR | Status: AC
  Administered 2017-10-24: 0.5 mL via INTRAMUSCULAR
  Filled 2017-10-23: qty 0.5

## 2017-10-23 MED ORDER — MORPHINE SULFATE (PF) 4 MG/ML IV SOLN
4.0000 mg | Freq: Once | INTRAVENOUS | Status: AC
  Administered 2017-10-23: 4 mg via INTRAVENOUS
  Filled 2017-10-23: qty 1

## 2017-10-23 MED ORDER — POTASSIUM CHLORIDE CRYS ER 20 MEQ PO TBCR
40.0000 meq | EXTENDED_RELEASE_TABLET | Freq: Once | ORAL | Status: AC
  Administered 2017-10-23: 40 meq via ORAL
  Filled 2017-10-23: qty 2

## 2017-10-23 MED ORDER — VANCOMYCIN HCL IN DEXTROSE 750-5 MG/150ML-% IV SOLN: 750.0000 mg | INTRAVENOUS | Status: DC

## 2017-10-23 MED ORDER — ENOXAPARIN SODIUM 40 MG/0.4ML ~~LOC~~ SOLN
40.0000 mg | SUBCUTANEOUS | Status: DC
  Administered 2017-10-23 – 2017-10-24 (×2): 40 mg via SUBCUTANEOUS
  Filled 2017-10-23 (×2): qty 0.4

## 2017-10-23 MED ORDER — OXYCODONE HCL 5 MG PO TABS
5.0000 mg | ORAL_TABLET | ORAL | Status: DC | PRN
  Administered 2017-10-23: 5 mg via ORAL
  Filled 2017-10-23: qty 1

## 2017-10-23 MED ORDER — ONDANSETRON HCL 4 MG/2ML IJ SOLN: 4.0000 mg | Freq: Four times a day (QID) | INTRAMUSCULAR | Status: DC | PRN

## 2017-10-23 MED ORDER — HYDROMORPHONE HCL 1 MG/ML IJ SOLN
1.0000 mg | Freq: Once | INTRAMUSCULAR | Status: AC
  Administered 2017-10-23: 1 mg via INTRAVENOUS
  Filled 2017-10-23: qty 1

## 2017-10-23 MED ORDER — ONDANSETRON HCL 4 MG/2ML IJ SOLN
4.0000 mg | Freq: Once | INTRAMUSCULAR | Status: AC
  Administered 2017-10-23: 4 mg via INTRAVENOUS
  Filled 2017-10-23: qty 2

## 2017-10-23 MED ORDER — KETOROLAC TROMETHAMINE 30 MG/ML IJ SOLN
30.0000 mg | Freq: Once | INTRAMUSCULAR | Status: AC
  Administered 2017-10-23: 30 mg via INTRAVENOUS
  Filled 2017-10-23: qty 1

## 2017-10-23 MED ORDER — POLYETHYLENE GLYCOL 3350 17 G PO PACK: 17.0000 g | PACK | Freq: Every day | ORAL | Status: DC | PRN

## 2017-10-23 NOTE — ED Triage Notes (Signed)
Patient woke up to the pain of both BKAs has been having foul odor to both of her BKAs.  The patient called EMS and EMS brought her to the ED.  Patient was crying due to pain.

## 2017-10-23 NOTE — ED Provider Notes (Signed)
[facility] EMERGENCY DEPARTMENT Provider Note   CSN: [id number] Arrival date & time: 10/23/17  0549     History   Chief Complaint Chief Complaint  Patient presents with  . Leg Pain    HPI [name] is a 57 y.o. female with history of hypertension, cocaine abuse, bilateral BKA's who presents with acute on chronic leg pain.  Patient has had increasing pain over the past day.  She reports foul-smelling discharge from wounds on her stumps.  She was seen on 10/17/2017 and had dressings placed on the wounds.  Per chart review, no infection or purulent drainage at that time.  Patient has had the dressings in place for 1 week.  She denies any fevers.  She also reports that she has had a productive cough with pain in her chest with coughing for the past week.  She denies shortness of breath, abdominal pain, nausea, or vomiting.  HPI  Past Medical History:  Diagnosis Date  . CAP (community acquired pneumonia) 03/2014 X 2  . Cocaine abuse ([place])    ongoing with resultant vaculitis.  . Depression   . Headache    "weekly" (07/29/2016)  . Hypertension   . Inflammatory arthritis   . Migraines    "probably 5-6/yr" (07/29/2016)  . Normocytic anemia    BL Hgb 9.8-12. Last anemia panel 04/2010 - showing Fe 19, ferritin 101.  Pt on monthly B12 injections  . Rheumatoid arthritis(714.0)    patient reported  . 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 Followed by Dr. Louanne Skye    Patient Active Problem List   Diagnosis Date Noted  . 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 Hypertensive crisis   . Phantom limb pain ([place])   . S/P bilateral below knee amputation ([place]) 04/13/2017  . Tobacco abuse   . Post-operative pain   . Acute blood loss anemia   . Atherosclerosis of native arteries of extremities with gangrene, left leg ([place])   . Wound infection 03/30/2017  . AKI (acute kidney injury)  ([place]) 02/07/2017  . Acute kidney injury ([place]) 02/06/2017  . Wound healing, delayed   . Chest pain 04/07/2015  . Protein-calorie malnutrition, severe ([place]) 01/15/2015  . MDD (major depressive disorder), recurrent episode, severe ([place]) 10/16/2014  . Cocaine use disorder, severe, dependence ([place]) 10/10/2014  . 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 Essential hypertension 02/26/2010    Past Surgical History:  Procedure Laterality Date  . AMPUTATION Left 05/22/2016   Procedure: AMPUTATION LEFT LONG FINGER;  Surgeon: Marybelle Killings, MD;  Location: [facility];  Service: Orthopedics;  Laterality: Left;  . AMPUTATION Bilateral 04/10/2017   Procedure: AMPUTATION BELOW KNEE;  Surgeon: Newt Minion, MD;  Location: [facility];  Service: Orthopedics;  Laterality: Bilateral;  . HERNIA REPAIR     "stomach"  . I&D EXTREMITY Right 09/26/2015   Procedure: IRRIGATION AND DEBRIDEMENT LEG WOUND  VAC PLACEMENT.;  Surgeon: Loel Lofty Dillingham, DO;  Location: [facility];  Service: Plastics;  Laterality: Right;  . INCISION AND DRAINAGE OF WOUND Bilateral 10/20/2016   Procedure: IRRIGATION AND DEBRIDEMENT WOUND BILATERAL;  Surgeon: Edrick Kins, DPM;  Location: [facility];  Service: Podiatry;  Laterality: Bilateral;  . IRRIGATION AND DEBRIDEMENT ABSCESS Bilateral 09/26/2013   Procedure: DEBRIDEMENT ULCERS BILATERAL THIGHS;  Surgeon: Gwenyth Ober, MD;  Location: [facility];  Service: General;  Laterality: Bilateral;  . SKIN BIOPSY Bilateral  shin nodules    OB History    No data available       Home Medications    Prior to Admission medications   Medication Sig Start Date End Date Taking? Authorizing Provider  amLODipine (NORVASC) 10 MG tablet Take 1 tablet (10 mg total) by mouth daily. Patient not taking: Reported on 10/09/2017 09/14/17   Frederica Kuster, PA-C  busPIRone (BUSPAR) 15 MG tablet Take 1 tablet (15 mg total) by mouth 3 (three) times daily. Patient not taking: Reported on 08/09/2017 07/26/17   Scot Jun, FNP  cyclobenzaprine (FLEXERIL) 5 MG tablet Take 1 tablet (5 mg total) by mouth 3 (three) times daily as needed (muscle soreness). Patient not taking: Reported on 08/09/2017 07/04/17   Rolland Porter, MD  escitalopram (LEXAPRO) 10 MG tablet Take 2 tablets (20 mg total) by mouth at bedtime. Patient not taking: Reported on 08/09/2017 07/26/17   Scot Jun, FNP  gabapentin (NEURONTIN) 600 MG tablet Take 1 tablet (600 mg total) by mouth 3 (three) times daily. Patient not taking: Reported on 08/09/2017 07/26/17   Scot Jun, FNP  hydrochlorothiazide (HYDRODIURIL) 25 MG tablet Take 1 tablet (25 mg total) daily by mouth. Patient not taking: Reported on 09/14/2017 08/11/17   Dorie Rank, MD  potassium chloride (K-DUR) 10 MEQ tablet Take 1 tablet (10 mEq total) daily by mouth. Patient not taking: Reported on 09/14/2017 08/11/17   Dorie Rank, MD    Family History Family History  Problem Relation Age of Onset  . Breast cancer Mother        Breast cancer  . Alcohol abuse Mother   . Colon cancer Maternal Aunt 8  . Alcohol abuse Father     Social History Social History   Tobacco Use  . Smoking status: Current Every Day Smoker    Packs/day: 0.12    Years: 38.00    Pack years: 4.56    Types: Cigarettes  . Smokeless tobacco: Never Used  . Tobacco comment: 2 A DAY  Substance Use Topics  . Alcohol use: No    Alcohol/week: 0.0 oz  . Drug use: Yes    Types: "Crack" cocaine, Cocaine    Comment: Smoked crack 06/02/2017     Allergies   Acetaminophen and Other   Review of Systems Review of Systems  Constitutional: Negative for chills and fever.  HENT: Negative for facial swelling and sore throat.   Respiratory: Positive for cough. Negative for shortness of breath.   Cardiovascular: Positive for chest pain (with coughing).  Gastrointestinal: Negative for abdominal pain, nausea and vomiting.  Genitourinary: Negative for dysuria.  Musculoskeletal: Positive for  arthralgias. Negative for back pain.  Skin: Positive for wound. Negative for rash.  Neurological: Negative for headaches.  Psychiatric/Behavioral: The patient is not nervous/anxious.      Physical Exam Updated Vital Signs BP (!) 158/95 (BP Location: Left Arm)   Pulse 75   Temp 99.9 F (37.7 C) (Oral)   Resp (!) 24   SpO2 100%   Physical Exam  Constitutional: She appears well-developed and well-nourished. No distress.  Patient crying in pain  HENT:  Head: Normocephalic and atraumatic.  Mouth/Throat: Oropharynx is clear and moist. No oropharyngeal exudate.  Eyes: Conjunctivae are normal. Pupils are equal, round, and reactive to light. Right eye exhibits no discharge. Left eye exhibits no discharge. No scleral icterus.  Neck: Normal range of motion. Neck supple. No thyromegaly present.  Cardiovascular: Normal rate, regular rhythm, normal heart sounds and intact  distal pulses. Exam reveals no gallop and no friction rub.  No murmur heard. Pulmonary/Chest: Effort normal and breath sounds normal. No stridor. No respiratory distress. She has no wheezes. She has no rales.  Abdominal: Soft. Bowel sounds are normal. She exhibits no distension. There is no tenderness. There is no rebound and no guarding.  Musculoskeletal: She exhibits no edema.  Lymphadenopathy:    She has no cervical adenopathy.  Neurological: She is alert. Coordination normal.  Skin: Skin is warm and dry. No rash noted. She is not diaphoretic. No pallor.  Circular wounds to bilateral knees, purulent drainage bilaterally, foul-smelling; significantly tender  Psychiatric: She has a normal mood and affect.  Nursing note and vitals reviewed.        ED Treatments / Results  Labs (all labs ordered are listed, but only abnormal results are displayed) Labs Reviewed  BASIC METABOLIC PANEL - Abnormal; Notable for the following components:      Result Value   Sodium 134 (*)    Potassium 3.3 (*)    CO2 20 (*)    Calcium  8.8 (*)    All other components within normal limits  CBC WITH DIFFERENTIAL/PLATELET - Abnormal; Notable for the following components:   WBC 3.3 (*)    Hemoglobin 11.1 (*)    HCT 33.4 (*)    Platelets 409 (*)    All other components within normal limits  AEROBIC CULTURE (SUPERFICIAL SPECIMEN)  CULTURE, BLOOD (ROUTINE X 2)  CULTURE, BLOOD (ROUTINE X 2)  I-STAT TROPONIN, ED  I-STAT CG4 LACTIC ACID, ED  I-STAT CG4 LACTIC ACID, ED    EKG  EKG Interpretation None       Radiology Dg Chest 2 View  Result Date: 10/23/2017 CLINICAL DATA:  Cough and chest pain EXAM: CHEST  2 VIEW COMPARISON:  Chest radiograph October 09, 2017 and chest CT October 09, 2017 FINDINGS: There is an apparent nipple shadow on the right. There is scarring in the right upper lobe. There is no edema or consolidation. The heart size and pulmonary vascularity are normal. No adenopathy. There is aortic atherosclerosis. No evident bone lesions. IMPRESSION: Scarring right upper lobe. No edema or consolidation. There is aortic atherosclerosis. Aortic Atherosclerosis (ICD10-I70.0). Electronically Signed   By: Lowella Grip III M.D.   On: 10/23/2017 08:18   Dg Knee Complete 4 Views Left  Result Date: 10/23/2017 CLINICAL DATA:  Previous left BKA, pain EXAM: LEFT KNEE - COMPLETE 4+ VIEW COMPARISON:  07/31/2017 FINDINGS: Remote left BKA changes. No acute osseous finding or fracture. No joint abnormality or knee effusion. No significant soft tissue finding by plain radiography. IMPRESSION: Remote left BKA.  No acute osseous finding by plain radiography Electronically Signed   By: Jerilynn Mages.  Shick M.D.   On: 10/23/2017 10:05   Dg Knee Complete 4 Views Right  Result Date: 10/23/2017 CLINICAL DATA:  Knee pain, previous BKA EXAM: RIGHT KNEE - COMPLETE 4+ VIEW COMPARISON:  07/31/2017 FINDINGS: Remote right BKA. No acute osseous finding or fracture. No joint effusion or joint abnormality. No definite soft tissue abnormality by plain  radiography. IMPRESSION: Remote BKA.  No acute finding by plain radiography Electronically Signed   By: Jerilynn Mages.  Shick M.D.   On: 10/23/2017 10:04    Procedures Procedures (including critical care time)  Medications Ordered in ED Medications  vancomycin (VANCOCIN) IVPB 750 mg/150 ml premix (not administered)  morphine 4 MG/ML injection 4 mg (4 mg Intravenous Given 10/23/17 0830)  vancomycin (VANCOCIN) IVPB 1000 mg/200 mL premix (  0 mg Intravenous Stopped 10/23/17 1030)  HYDROmorphone (DILAUDID) injection 1 mg (1 mg Intravenous Given 10/23/17 0922)  ondansetron (ZOFRAN) injection 4 mg (4 mg Intravenous Given 10/23/17 1132)     Initial Impression / Assessment and Plan / ED Course  I have reviewed the triage vital signs and the nursing notes.  Pertinent labs & imaging results that were available during my care of the patient were reviewed by me and considered in my medical decision making (see chart for details).     Patient with purulent wound infections to bilateral knees.  Patient with oral temp 99.9.  Rectal temp requested, however patient is at home patient at the moment.   CBC shows WBC 3.3, hemoglobin 11.1.  BMP shows mild hypokalemia, 3.3.  Lactic 0.60.  Blood cultures and wound cultures pending.  Vancomycin initiated considering patient has history of MRSA infection.  No osteomyelitis found on bilateral knee x-rays.  Chest x-ray is negative for edema or consolidation.  Pain controlled with Dilaudid 1 mg in the ED.  I feel patient's temperature related to wound infections.  Patient began vomiting after eating in the ED, given Zofran.  I consulted the internal medicine teaching service who will admit the patient for further management.  Patient also evaluated by Dr. Regenia Skeeter who guided the patient's management and agrees with plan.  Final Clinical Impressions(s) / ED Diagnoses   Final diagnoses:  Wound infection    ED Discharge Orders    None       Frederica Kuster, PA-C 10/23/17  1207    Sherwood Gambler, MD 10/24/17 [phone]

## 2017-10-23 NOTE — H&P (Signed)
Date: 10/23/2017               Patient Name:  [name] MRN: [id number]  DOB: 21-Nov-1960 Age / Sex: 57 y.o., female   PCP: Scot Jun, FNP         Medical Service: Internal Medicine Teaching Service         Attending Physician: Dr. Aldine Contes, MD    First Contact: Dr. Tarri Abernethy Pager: [phone]  Second Contact: Dr. 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 Patient was previously seen in the ED on 1/13 for similar type of pain. At that point she was noted to have superficial wounds but no purulent discharge, surrounding erythema, or warmth. Her wounds were dressed, but no antibiotics were prescribed at that point. She presents in today with acute pain, and states that her dressings are now foul-smelling. She states that her wounds are infected now. She also states that it is cold outside and feels that the cold has settled into her legs. She currently denies any fevers, chills, nausea/vomiting, abdominal pain, shortness of breath.   She does endorse continued cocaine use.  Meds:  Current Meds  Medication Sig  . PRESCRIPTION MEDICATION Inhale 1 puff into the lungs as needed (Shortness of breath).   Allergies: Allergies as of 10/23/2017 - Review Complete 10/23/2017  Allergen Reaction Noted  . Acetaminophen Swelling and Other (See Comments)   . Lisinopril Other (See Comments) 10/23/2017   Past Medical History:  Diagnosis Date  . CAP (community acquired pneumonia) 03/2014 X 2  . Cocaine abuse ([place])    ongoing with resultant vaculitis.  . Depression   . Headache    "weekly" (07/29/2016)  .  Hypertension   . Inflammatory arthritis   . Migraines    "probably 5-6/yr" (07/29/2016)  . Normocytic anemia    BL Hgb 9.8-12. Last anemia panel 04/2010 - showing Fe 19, ferritin 101.  Pt on monthly B12 injections  . Rheumatoid arthritis(714.0)    patient reported  . 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 Followed by Dr. Louanne Skye   Family History:  Mother: + Breast cancer, alcohol abuse  Father: + Alcohol abuse   Social History:  Lives in a group home  Endorses the use of cocaine and tobacco  Denies EtOH use  Review of Systems: A complete ROS was negative except as per HPI.   Physical Exam: Blood pressure (!) 158/95, pulse 75, temperature (!) 97.4 F (36.3 C), temperature source Rectal, resp. rate (!) 24, SpO2 100 %.  General: Thin female in no acute distress HENT: Normocephalic, atraumatic, loss of cartilage of the pinna on her ears bilaterally.  Erythema and sores around the nares Pulm: Good air movement with no wheezing or crackles CV: Regular rate and rhythm, no murmurs, no rubs Abdomen: Active bowel sounds, soft, nondistended, no tenderness to palpation Extremities: With superficial wounds on the patella bilaterally. No purulent discharge noted, but is foul-smelling. Extension contractures of the PIP joints on the right hand with skin tightening around the digits. Skin: Warm and dry,  skin tightening of the bilateral hands Neuro: Alert and oriented x3  CXR: personally reviewed: my interpretation is good inspiration, penetration, and rotation. No bony or soft tissue abnormalities. No cardiomegaly. Hyperinflation of the lung fields, with no opacities or infiltrates noted. No pleural effusions.  Assessment & Plan by Problem: Active Problems:   Ulcer of amputation stump of lower extremity (HCC)  Bilateral superficial ulcers of amputation stumps. 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 Bilateral knee x-rays in the ED illustrated no foreign bodies or acute osseous changes. On exam no purulent discharge was noted. Will consult wound care for further evaluation. At this point do not think there is any indication for antibiotic therapy. Patient is afebrile and does not have a leukocytosis. - Consult wound care - Pain management with OxyIR 5 mg - No need for antibiotic therapy at this point.  Hypertension  - Previously prescribed Amlodipine 10 mg QD however, never started taking it. Will restart if needed.   Substance abuse - Patient continues to use cocaine - Attempting to quit, will provide with resources on discharge.  Normocytic anemia - Stable compared to prior - Previous workup indicating iron deficiency anemia  Hypokalemia - Potassium 3.3 today. Replace p.o.  Diet: Regular DVT prophylaxis: Lovenox CODE STATUS: Full code  Dispo: Admit patient to Observation with expected length of stay less than 2 midnights.  Signed: Ina Homes, MD 10/23/2017, 1:00 PM  My Pager: [phone]

## 2017-10-23 NOTE — Progress Notes (Signed)
Pharmacy Antibiotic Note  [name] is a 57 y.o. female admitted on 10/23/2017 with cellulitis.  Pharmacy has been consulted for Vancomycin dosing. WBC low, Tmax 99.9. BP elevated. HR 80s. LA is normal. No history of diabetes noted. Vancomycin 1g IV x1 ordered in ED.   Patient has bilateral BKAs both with foul odor and pain.  SCr is 0.67 but likely falsely low in setting of bilateral BKA and decreased muscle mass. Will be cautious interpreting SCr.   Plan: Start Vancomycin 750 mg IV every 24 hours tomorrow for goal trough 10-15.  Monitor renal function, culture results, and clinical status.      Temp (24hrs), Avg:99.2 F (37.3 C), Min:98.4 F (36.9 C), Max:99.9 F (37.7 C)  Recent Labs  Lab 10/23/17 0748 10/23/17 0836  WBC 3.3*  --   LATICACIDVEN  --  0.60    Estimated Creatinine Clearance: 56.5 mL/min (by C-G formula based on SCr of 0.67 mg/dL).    Allergies  Allergen Reactions  . Acetaminophen Swelling and Other (See Comments)    Reaction:  Eyelid swelling  . Other Other (See Comments)    Pt states she is allergic to a blood pressure medication, but does not know the reaction or medication    Antimicrobials this admission: Vancomycin 1/19 >>  Dose adjustments this admission:   Microbiology results: 1/19 BCx >> 1/19 Wound Cx >>  Thank you for allowing pharmacy to be a part of this patient's care.  Sloan Leiter, PharmD, BCPS, BCCCP Clinical Pharmacist Clinical phone 10/23/2017 until 3:30PM [phone] After hours, please call #[phone] 10/23/2017 8:58 AM

## 2017-10-24 DIAGNOSIS — Z888 Allergy status to other drugs, medicaments and biological substances status: Secondary | ICD-10-CM | POA: Diagnosis not present

## 2017-10-24 DIAGNOSIS — I10 Essential (primary) hypertension: Secondary | ICD-10-CM

## 2017-10-24 DIAGNOSIS — L959 Vasculitis limited to the skin, unspecified: Secondary | ICD-10-CM

## 2017-10-24 DIAGNOSIS — R509 Fever, unspecified: Secondary | ICD-10-CM | POA: Diagnosis present

## 2017-10-24 DIAGNOSIS — L958 Other vasculitis limited to the skin: Secondary | ICD-10-CM | POA: Diagnosis present

## 2017-10-24 DIAGNOSIS — F1410 Cocaine abuse, uncomplicated: Secondary | ICD-10-CM | POA: Diagnosis present

## 2017-10-24 DIAGNOSIS — L97929 Non-pressure chronic ulcer of unspecified part of left lower leg with unspecified severity: Secondary | ICD-10-CM | POA: Diagnosis not present

## 2017-10-24 DIAGNOSIS — Z886 Allergy status to analgesic agent status: Secondary | ICD-10-CM | POA: Diagnosis not present

## 2017-10-24 DIAGNOSIS — T8789 Other complications of amputation stump: Secondary | ICD-10-CM | POA: Diagnosis not present

## 2017-10-24 DIAGNOSIS — T374X1A Poisoning by anthelminthics, accidental (unintentional), initial encounter: Secondary | ICD-10-CM | POA: Diagnosis present

## 2017-10-24 DIAGNOSIS — L97819 Non-pressure chronic ulcer of other part of right lower leg with unspecified severity: Secondary | ICD-10-CM | POA: Diagnosis not present

## 2017-10-24 DIAGNOSIS — Z89512 Acquired absence of left leg below knee: Secondary | ICD-10-CM | POA: Diagnosis not present

## 2017-10-24 DIAGNOSIS — L97829 Non-pressure chronic ulcer of other part of left lower leg with unspecified severity: Secondary | ICD-10-CM | POA: Diagnosis not present

## 2017-10-24 DIAGNOSIS — Z803 Family history of malignant neoplasm of breast: Secondary | ICD-10-CM | POA: Diagnosis not present

## 2017-10-24 DIAGNOSIS — E876 Hypokalemia: Secondary | ICD-10-CM | POA: Diagnosis present

## 2017-10-24 DIAGNOSIS — Z23 Encounter for immunization: Secondary | ICD-10-CM | POA: Diagnosis not present

## 2017-10-24 DIAGNOSIS — D649 Anemia, unspecified: Secondary | ICD-10-CM

## 2017-10-24 DIAGNOSIS — Z89511 Acquired absence of right leg below knee: Secondary | ICD-10-CM

## 2017-10-24 LAB — CBC
HCT: 31.8 % — ABNORMAL LOW (ref 36.0–46.0)
Hemoglobin: 10.3 g/dL — ABNORMAL LOW (ref 12.0–15.0)
MCH: 26.8 pg (ref 26.0–34.0)
MCHC: 32.4 g/dL (ref 30.0–36.0)
MCV: 82.8 fL (ref 78.0–100.0)
PLATELETS: 326 10*3/uL (ref 150–400)
RBC: 3.84 MIL/uL — ABNORMAL LOW (ref 3.87–5.11)
RDW: 15.7 % — AB (ref 11.5–15.5)
WBC: 3 10*3/uL — ABNORMAL LOW (ref 4.0–10.5)

## 2017-10-24 LAB — BASIC METABOLIC PANEL
Anion gap: 10 (ref 5–15)
BUN: 12 mg/dL (ref 6–20)
CALCIUM: 8.6 mg/dL — AB (ref 8.9–10.3)
CO2: 19 mmol/L — AB (ref 22–32)
CREATININE: 0.68 mg/dL (ref 0.44–1.00)
Chloride: 107 mmol/L (ref 101–111)
GFR calc Af Amer: >60 mL/min (ref >60)
GFR calc non Af Amer: >60 mL/min (ref >60)
GLUCOSE: 114 mg/dL — AB (ref 65–99)
Potassium: 3.9 mmol/L (ref 3.5–5.1)
Sodium: 136 mmol/L (ref 135–145)

## 2017-10-24 MED ORDER — KETOROLAC TROMETHAMINE 30 MG/ML IJ SOLN
30.0000 mg | Freq: Four times a day (QID) | INTRAMUSCULAR | Status: DC | PRN
Start: 2017-10-24 — End: 2017-10-26
  Administered 2017-10-24 – 2017-10-25 (×3): 30 mg via INTRAVENOUS
  Filled 2017-10-24 (×3): qty 1

## 2017-10-24 MED ORDER — ALBUTEROL SULFATE (2.5 MG/3ML) 0.083% IN NEBU
2.5000 mg | INHALATION_SOLUTION | Freq: Four times a day (QID) | RESPIRATORY_TRACT | Status: DC | PRN
  Administered 2017-10-24: 2.5 mg via RESPIRATORY_TRACT
  Filled 2017-10-24: qty 3

## 2017-10-24 MED ORDER — VANCOMYCIN HCL IN DEXTROSE 750-5 MG/150ML-% IV SOLN
750.0000 mg | INTRAVENOUS | Status: DC
  Filled 2017-10-24: qty 150

## 2017-10-24 NOTE — Progress Notes (Signed)
Pharmacy Antibiotic Note  [name] is a 57 y.o. female admitted on 10/23/2017 with cellulitis.  Pharmacy has been consulted for Vancomycin dosing. Vancomycin 1g IV x1 given in the ED on 1/19, then d/c'd. Now vancomycin consult resumed.   Patient has bilateral BKAs both with foul odor and pain.  SCr is 0.68 stable but likely falsely low in setting of bilateral BKA and decreased muscle mass. Will be cautious interpreting SCr.   Plan: Resume Vancomycin 750 mg IV every 24 hours tomorrow for goal trough 10-15.  Monitor renal function, culture results, and clinical status.      Temp (24hrs), Avg:99.7 F (37.6 C), Min:98.3 F (36.8 C), Max:101 F (38.3 C)  Recent Labs  Lab 10/23/17 0748 10/23/17 0836 10/23/17 1418 10/24/17 0904  WBC 3.3*  --  3.1* 3.0*  CREATININE 0.72  --  0.69 0.68  LATICACIDVEN  --  0.60  --   --     Estimated Creatinine Clearance: 56.5 mL/min (by C-G formula based on SCr of 0.68 mg/dL).    Allergies  Allergen Reactions  . Acetaminophen Swelling and Other (See Comments)    Reaction:  Eyelid swelling  . Lisinopril Other (See Comments)    Unk    Elicia Lamp, PharmD, BCPS Clinical Pharmacist 10/24/2017 6:04 PM

## 2017-10-24 NOTE — Progress Notes (Signed)
   Subjective: Ms. Fluellen continues to have pain in her bilateral amputation stumps. Continues to insist that she wrap her own wounds. She states that last night she had an episode where she felt hot and nauseated. She has not had any other episodes since then. Today she feels well, but her pain continues to be difficult to control. We discussed that we will have wound care come by and see her ulcers, and continue to work on pain control. We discussed that we do not think she needs an antibiotic at this point. Discussed cessation of her cocaine use. All questions and concerns addressed.  Objective: Vital signs in last 24 hours: Vitals:   10/23/17 1340 10/23/17 1418 10/23/17 2248 10/24/17 0511  BP: 136/75 (!) 147/79 (!) 103/48 (!) 151/66  Pulse: 83 70 71 71  Resp: 17 18 17 18   Temp:  98.4 F (36.9 C) 98.3 F (36.8 C) 98.6 F (37 C)  TempSrc:  Oral Oral   SpO2: 100% 100% 100% 99%   General: Thin female in no acute distress Pulm: Good air movement with no wheezing or crackles CV: Regular rate and rhythm, no murmurs, no rubs Abdomen: Active bowel sounds, soft, nondistended, no tenderness to palpation Extremities: Bilateral below-the-knee amputations. Amputation stump is bandaged. Bandages removed, ulcerations without purulent discharge noted bilaterally.  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 At this point we do not believe there is any indication for antibiotics. We are awaiting wound care consult. Further management as outlined below.  Bilateral superficial ulcers of amputation stumps. - Patient continues to have pain, but remains afebrile. - Awaiting wound care consult. - Pain management with OxyIR 10 mg every 4 hours and as needed Toradol - No need for antibiotics at this point  Hypertension  -  Previously prescribed Amlodipine 10 mg QD however, never started taking it. Will restart if needed.   Substance abuse - Patient continues to use cocaine - Attempting to quit, will provide with resources on discharge.  Normocytic anemia - Stable compared to prior - Previous workup indicating iron deficiency anemia  Hypokalemia. Resolved  Dispo: Anticipated discharge in approximately 0-1 day(s) pending clinical improvement and wound care consult.   Ina Homes, MD 10/24/2017, 6:02 AM My Pager: [phone]

## 2017-10-25 LAB — CBC
HEMATOCRIT: 30.6 % — AB (ref 36.0–46.0)
Hemoglobin: 9.9 g/dL — ABNORMAL LOW (ref 12.0–15.0)
MCH: 26.6 pg (ref 26.0–34.0)
MCHC: 32.4 g/dL (ref 30.0–36.0)
MCV: 82.3 fL (ref 78.0–100.0)
Platelets: 306 10*3/uL (ref 150–400)
RBC: 3.72 MIL/uL — ABNORMAL LOW (ref 3.87–5.11)
RDW: 15.4 % (ref 11.5–15.5)
WBC: 3.2 10*3/uL — ABNORMAL LOW (ref 4.0–10.5)

## 2017-10-25 LAB — BASIC METABOLIC PANEL
Anion gap: 8 (ref 5–15)
BUN: 14 mg/dL (ref 6–20)
CO2: 22 mmol/L (ref 22–32)
CREATININE: 0.84 mg/dL (ref 0.44–1.00)
Calcium: 8.4 mg/dL — ABNORMAL LOW (ref 8.9–10.3)
Chloride: 109 mmol/L (ref 101–111)
GFR calc Af Amer: >60 mL/min (ref >60)
GLUCOSE: 89 mg/dL (ref 65–99)
POTASSIUM: 3.9 mmol/L (ref 3.5–5.1)
Sodium: 139 mmol/L (ref 135–145)

## 2017-10-25 LAB — HIV ANTIBODY (ROUTINE TESTING W REFLEX): HIV SCREEN 4TH GENERATION: NONREACTIVE

## 2017-10-25 LAB — HCG, QUANTITATIVE, PREGNANCY: hCG, Beta Chain, Quant, S: 2 m[IU]/mL (ref <5)

## 2017-10-25 MED ORDER — DOXYCYCLINE HYCLATE 100 MG PO TABS
100.0000 mg | ORAL_TABLET | Freq: Two times a day (BID) | ORAL | Status: DC
  Administered 2017-10-25 – 2017-10-26 (×3): 100 mg via ORAL
  Filled 2017-10-25 (×3): qty 1

## 2017-10-25 MED ORDER — ENOXAPARIN SODIUM 30 MG/0.3ML ~~LOC~~ SOLN
30.0000 mg | SUBCUTANEOUS | Status: DC
  Administered 2017-10-25 – 2017-10-26 (×2): 30 mg via SUBCUTANEOUS
  Filled 2017-10-25 (×2): qty 0.3

## 2017-10-25 NOTE — Progress Notes (Signed)
   Subjective: Patient is doing well this morning. She endorses fevers overnight, but feels that her pain is improving. We discussed that we are restarting antibiotics, and continuing to wait on wound care consult. We discussed that we will be putting her on doxycycline for 7 days. She will need help with getting medications. We have consulted case management to aid in getting her medications. All questions and concerns addressed.  Objective: Vital signs in last 24 hours: Vitals:   10/24/17 1430 10/24/17 1703 10/24/17 2222 10/25/17 0445  BP: 106/85 (!) 129/58 118/62 (!) 158/78  Pulse: 94 89 76 82  Resp:  20 19 18   Temp: (!) 100.8 F (38.2 C) (!) 101 F (38.3 C) 99.6 F (37.6 C) (!) 100.9 F (38.3 C)  TempSrc: Oral Oral    SpO2: 98% 100% 99% 99%   General: Thin female in no acute distress Pulm: Good air movement with no wheezing or crackles CV: Regular rate and rhythm, no murmurs, no rubs Abdomen: Active bowel sounds, soft, nondistended, no tenderness to palpation Extremities: Lateral below the knee amputations. 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 Overnight the patient became febrile and was started on vancomycin. We are awaiting wound care consult. Further management as outlined below.  Bilateral superficial ulcers of amputation stumps. - Patient continues to have pain and became febrile overnight - Awaiting wound care consult. - Pain management with OxyIR 10 mg every 4 hours and as needed Toradol - Patient started on empiric vancomycin overnight. She is able to tolerate oral intake, we will transition her to doxycycline 154m BID for 7 days.   Hypertension  - Previously prescribed Amlodipine 10 mg QD however, never started taking it. Will restart if  needed.  Substance abuse -Patient continues to use cocaine -Attempting to quit, will provide with resources on discharge.  Normocytic anemia -Stable compared to prior -Previous workup indicating iron deficiency anemia  Hypokalemia. Resolved  Dispo: Anticipated discharge in approximately 1-2 day(s). Have consulted case management for medication assistance.   HIna Homes MD 10/25/2017, 5:55 AM My Pager: [phone]

## 2017-10-25 NOTE — Consult Note (Signed)
[place] Nurse wound consult note Reason for Consult: patient well known to [facility] Nurse team from previous admissions. She has undergone bilateral LL amputations since I last saw her and now presents with open, full thickness ulcerations to her bilateral knees and distal stump wounds  Wound type: Levamisole vasculitis: right and left knee.  Patient reports presented just like other ulcers have in the past.  She is still using drugs Surgical wounds distally are almost healed, only a few small open islands of tissue Pressure Injury POA:NA Measurement: Right knee: 1.0cm x 1.0cm x 0.1cm: 100% pink, granulation tissue Left knee: 3cm x 2.5cm x 0.1cm: 100% hypergranulation tissue Left stump: 0.2cm x 0.2cm x 0.1cm, 0.1cm x 0.5cmx 0.1cm, 0.1cm x 0.1cm x 0.1cm Right stump: 1.0cm x 0.2cm x 0.1cm; 0.2cm x 0.2cm x 0.1cm  Wound bed: all wounds are clean, pink, moist Drainage (amount, consistency, odor) minimal, serosanguinous  Periwound:intact, evidence of re-epithelialization  Dressing procedure/placement/frequency: Continue non-adherent dressings with dry dressing toppers. Patient can perform, she does run out of supplies and this causes issues with compliance with her care.  I have provided her with some extra supplies today for her care.  Discussed POC with patient and bedside nurse.  Re consult if needed, will not follow at this time. Thanks  Alister Staver [employer], RN,CWOCN, CNS, [place] [phone])

## 2017-10-25 NOTE — Care Management Note (Signed)
Case Management Note  Patient Details  Name: [name] MRN: [id number] Date of Birth: [birth year]  Subjective/Objective:                    Action/Plan:  Consult for : Will need Doxycycline prior to discharge  Patient has has Medicaid $3 co pay, unable to assist with co pay  Expected Discharge Date:                  Expected Discharge Plan:  Group Home  In-House Referral:     Discharge planning Services  CM Consult, Medication Assistance  Post Acute Care Choice:  NA Choice offered to:     DME Arranged:  N/A DME Agency:  NA  HH Arranged:  NA HH Agency:  NA  Status of Service:  Completed, signed off  If discussed at [place] of Stay Meetings, dates discussed:    Additional Comments:  Marilu Favre, RN 10/25/2017, 9:39 AM

## 2017-10-26 DIAGNOSIS — Z888 Allergy status to other drugs, medicaments and biological substances status: Secondary | ICD-10-CM

## 2017-10-26 DIAGNOSIS — Z886 Allergy status to analgesic agent status: Secondary | ICD-10-CM

## 2017-10-26 LAB — BASIC METABOLIC PANEL
ANION GAP: 11 (ref 5–15)
BUN: 15 mg/dL (ref 6–20)
CHLORIDE: 109 mmol/L (ref 101–111)
CO2: 19 mmol/L — ABNORMAL LOW (ref 22–32)
Calcium: 8.9 mg/dL (ref 8.9–10.3)
Creatinine, Ser: 0.61 mg/dL (ref 0.44–1.00)
GFR calc Af Amer: >60 mL/min (ref >60)
GFR calc non Af Amer: >60 mL/min (ref >60)
GLUCOSE: 94 mg/dL (ref 65–99)
POTASSIUM: 4 mmol/L (ref 3.5–5.1)
Sodium: 139 mmol/L (ref 135–145)

## 2017-10-26 LAB — AEROBIC CULTURE  (SUPERFICIAL SPECIMEN)

## 2017-10-26 LAB — AEROBIC CULTURE W GRAM STAIN (SUPERFICIAL SPECIMEN)

## 2017-10-26 LAB — CBC
HEMATOCRIT: 32.1 % — AB (ref 36.0–46.0)
Hemoglobin: 10.4 g/dL — ABNORMAL LOW (ref 12.0–15.0)
MCH: 26.6 pg (ref 26.0–34.0)
MCHC: 32.4 g/dL (ref 30.0–36.0)
MCV: 82.1 fL (ref 78.0–100.0)
Platelets: 263 10*3/uL (ref 150–400)
RBC: 3.91 MIL/uL (ref 3.87–5.11)
RDW: 15.7 % — AB (ref 11.5–15.5)
WBC: 2.3 10*3/uL — AB (ref 4.0–10.5)

## 2017-10-26 MED ORDER — OXYCODONE HCL 10 MG PO TABS: 10.0000 mg | ORAL_TABLET | Freq: Four times a day (QID) | ORAL | 0 refills | Status: AC | PRN

## 2017-10-26 MED ORDER — DOXYCYCLINE HYCLATE 100 MG PO TABS: 100.0000 mg | ORAL_TABLET | Freq: Two times a day (BID) | ORAL | 0 refills | Status: AC

## 2017-10-26 NOTE — Progress Notes (Signed)
Went over discharge instructions with pt and gave her AVS. Pt did not have any concerns regarding discharge plans or instructions given. Waiting for transportation via ptar this evening to the boarding house where she lives

## 2017-10-26 NOTE — Social Work (Signed)
CSW consulted for support with pt discharge; pt discharging to boarding home owned by her brother. Pt unable to find someone to pick her up and when brother was called he also was unable to support transportation back home.   Pt brother confirmed address; RN Case Manager covering alerted and requested CSW set up [place]. PTAR called; RN notified.   CSW signing off. Please consult if any additional needs arise.  Alexander Mt, [place] Work [phone]

## 2017-10-26 NOTE — Progress Notes (Signed)
   Subjective: The patient is doing well this AM. She states that wound care came by and showed her out to wrap her wounds and gave her supplies. We discussed that we will continue with the current treatment plan and discharge her home today. She agrees. All questions and concerns addressed.   Objective: Vital signs in last 24 hours: Vitals:   10/25/17 0445 10/25/17 1338 10/25/17 2124 10/26/17 0453  BP: (!) 158/78 (!) 131/7 (!) 172/65 (!) 146/66  Pulse: 82 72 76 (!) 55  Resp: 18 18 19 18   Temp: (!) 100.9 F (38.3 C) 98.7 F (37.1 C) 98.6 F (37 C) 98.2 F (36.8 C)  TempSrc:  Oral Oral Oral  SpO2: 99% 100% 100% 100%   General: Thin female in no acute distress Pulm: Good air movement with no wheezing or crackles  CV: RRR, no murmurs, no rubs  Abdomen: Active bowel sounds, soft, non-distended, no tenderness to palpation  Extremities: Bilateral below the knee amputations. Amputation stumps are bandaged.    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 Pain is doing better today. Plan to discharge home today. Further management as outlined below.  Bilateral superficial ulcers of amputation stumps. -Patient continues to have pain and became febrile overnight -Pain management with OxyIR 10 mg every 4 hours and as needed Toradol -Continue doxycycline 100 mg BID - Wound care recommended daily dressing changes and provided the patient with supplies   Hypertension  - Previously prescribed Amlodipine 10 mg QD however, never started taking it. Will restart if needed.  Substance abuse -Patient continues to use cocaine -Attempting to quit, will provide with resources on discharge.  Normocytic anemia -Stable compared to prior -Previous workup indicating iron deficiency anemia  Hypokalemia.  Resolved  Dispo: Anticipated discharge today.   Ina Homes, MD 10/26/2017, 5:45 AM My Pager: [phone]

## 2017-10-26 NOTE — Discharge Instructions (Signed)
Thank you for allowing [place] to provide your care. Please pick up your antibiotic at your pharmacy and take it as prescribed. Follow-up with your primary care doctor as soon as possible. Please continue to work on stopping your cocaine use. I have attached information to Alcohol and Drug Services in [place].  Cellulitis, Adult Cellulitis is a skin infection. The infected area is usually red and sore. This condition occurs most often in the arms and lower legs. It is very important to get treated for this condition. Follow these instructions at home:  Take over-the-counter and prescription medicines only as told by your doctor.  If you were prescribed an antibiotic medicine, take it as told by your doctor. Do not stop taking the antibiotic even if you start to feel better.  Drink enough fluid to keep your pee (urine) clear or pale yellow.  Do not touch or rub the infected area.  Raise (elevate) the infected area above the level of your heart while you are sitting or lying down.  Place warm or cold wet cloths (warm or cold compresses) on the infected area. Do this as told by your doctor.  Keep all follow-up visits as told by your doctor. This is important. These visits let your doctor make sure your infection is not getting worse. Contact a doctor if:  You have a fever.  Your symptoms do not get better after 1-2 days of treatment.  Your bone or joint under the infected area starts to hurt after the skin has healed.  Your infection comes back. This can happen in the same area or another area.  You have a swollen bump in the infected area.  You have new symptoms.  You feel ill and also have muscle aches and pains. Get help right away if:  Your symptoms get worse.  You feel very sleepy.  You throw up (vomit) or have watery poop (diarrhea) for a long time.  There are red streaks coming from the infected area.  Your red area gets larger.  Your red area turns darker. This  information is not intended to replace advice given to you by your health care provider. Make sure you discuss any questions you have with your health care provider. Document Released: 03/09/2008 Document Revised: 02/27/2016 Document Reviewed: 07/31/2015 Elsevier Interactive Patient Education  2018 [employer].

## 2017-10-26 NOTE — Discharge Summary (Signed)
Name: [name] MRN: [id number] DOB: [birth year] 57 y.o. PCP: Scot Jun, FNP  Date of Admission: 10/23/2017  6:00 AM Date of Discharge:  Attending Physician: Axel Filler, *  Discharge Diagnosis: 1. Ulcer of amputation stump of lower extremities.  2. Substance use disorder.   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   busPIRone 15 MG tablet Commonly known as:  BUSPAR Take 1 tablet (15 mg total) by mouth 3 (three) times daily.   cyclobenzaprine 5 MG tablet Commonly known as:  FLEXERIL Take 1 tablet (5 mg total) by mouth 3 (three) times daily as needed (muscle soreness).   doxycycline 100 MG tablet Commonly known as:  VIBRA-TABS Take 1 tablet (100 mg total) by mouth every 12 (twelve) hours for 5 days.   escitalopram 10 MG tablet Commonly known as:  LEXAPRO Take 2 tablets (20 mg total) by mouth at bedtime.   gabapentin 600 MG tablet Commonly known as:  NEURONTIN Take 1 tablet (600 mg total) by mouth 3 (three) times daily.   hydrochlorothiazide 25 MG tablet Commonly known as:  HYDRODIURIL Take 1 tablet (25 mg total) daily by mouth.   Oxycodone HCl 10 MG Tabs Take 1 tablet (10 mg total) by mouth every 6 (six) hours as needed for up to 5 days for moderate pain.   potassium chloride 10 MEQ tablet Commonly known as:  K-DUR Take 1 tablet (10 mEq total) daily by mouth.   PRESCRIPTION MEDICATION Inhale 1 puff into the lungs as needed (Shortness of breath).     Disposition and follow-up:   [name] was discharged from  [facility] in Stable condition.  At the hospital follow up visit please address:  1.  Substance Use Disorder. Encourage the patient to quit using cocaine and provide needed support.   2.  Labs / imaging needed at time of follow-up: None  3.  Pending labs/ test needing follow-up: None  Follow-up Appointments: Follow-up Information    Scot Jun, FNP. Schedule an appointment as soon as possible for a visit.   Specialty:  Family Medicine Contact information: [address] [place] [address] [phone]        Services, Alcohol And Drug Follow up.   Specialty:  [facility] information: [place] [address] [place]  [phone]         Hospital Course by problem list: Principal Problem:   Ulcer of amputation stump of lower extremity ([place]) Active Problems:   Cocaine-induced vascular disorder ([facility])   Ulcer of amputation stump of lower extremities. 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 Bilateral knee x-rays in the ED illustrated no foreign bodies or acute osseous changes. On exam no purulent discharge was noted; however the patient was febrile so she was started on Doxycycline. Wound care was consulted that the wounds were  clean, pink, moist with minimal discharge or odor. Felt like both wounds had significant granulation tissue. Instructed the patient how to care for her wounds and provided her with extra supplies. These wounds are likely a result of her Levamisole vasculitis. Discussed that it is very important she quit using cocaine otherwise these wounds continue to progress and result amputations in the future. She voices understanding and states that she is attempting to quit. On discharge we provided her with contact information for alcohol and drug  services in [place]. She was discharged with Oxy IR 10 mg for 5 days and a brief course of doxycycline. She was instructed to follow-up with her primary care provider as soon as possible.  Substance use disorder. She was encouraged to stop using cocaine. Provided with contact information to alcohol and drug services in [place].  Discharge Vitals:   BP (!) 146/66 (BP Location: Left Arm)   Pulse (!) 55   Temp 98.2 F (36.8 C) (Oral)   Resp 18   SpO2 100%   Pertinent Labs, Studies, and Procedures:   Left Knee X-ray Remote left BKA.  No acute osseous finding by plain radiography  Right Knee X-ray Remote BKA.  No acute finding by plain radiography  Discharge Instructions: Discharge Instructions    Call MD for:  persistant nausea and vomiting   Complete by:  As directed    Call MD for:  redness, tenderness, or signs of infection (pain, swelling, redness, odor or green/yellow discharge around incision site)   Complete by:  As directed    Call MD for:  severe uncontrolled pain   Complete by:  As directed    Call MD for:  temperature >100.4   Complete by:  As directed    Diet - low sodium heart healthy   Complete by:  As directed    Increase activity slowly   Complete by:  As directed      Signed: Ina Homes, MD 10/26/2017, 1:42 PM   My Pager: [phone]

## 2017-10-28 ENCOUNTER — Other Ambulatory Visit: Payer: Self-pay | Admitting: Family Medicine

## 2017-10-28 LAB — CULTURE, BLOOD (ROUTINE X 2)
Culture: NO GROWTH
Culture: NO GROWTH
Special Requests: ADEQUATE
Special Requests: ADEQUATE

## 2017-10-28 IMAGING — DX DG CHEST 2V
2 series · 2 of 2 positions shown · non-contrast
Comparison: 09/25/2015.

CLINICAL DATA: Chest pain with cough.

EXAM:
CHEST  2 VIEW

[w chest pa]
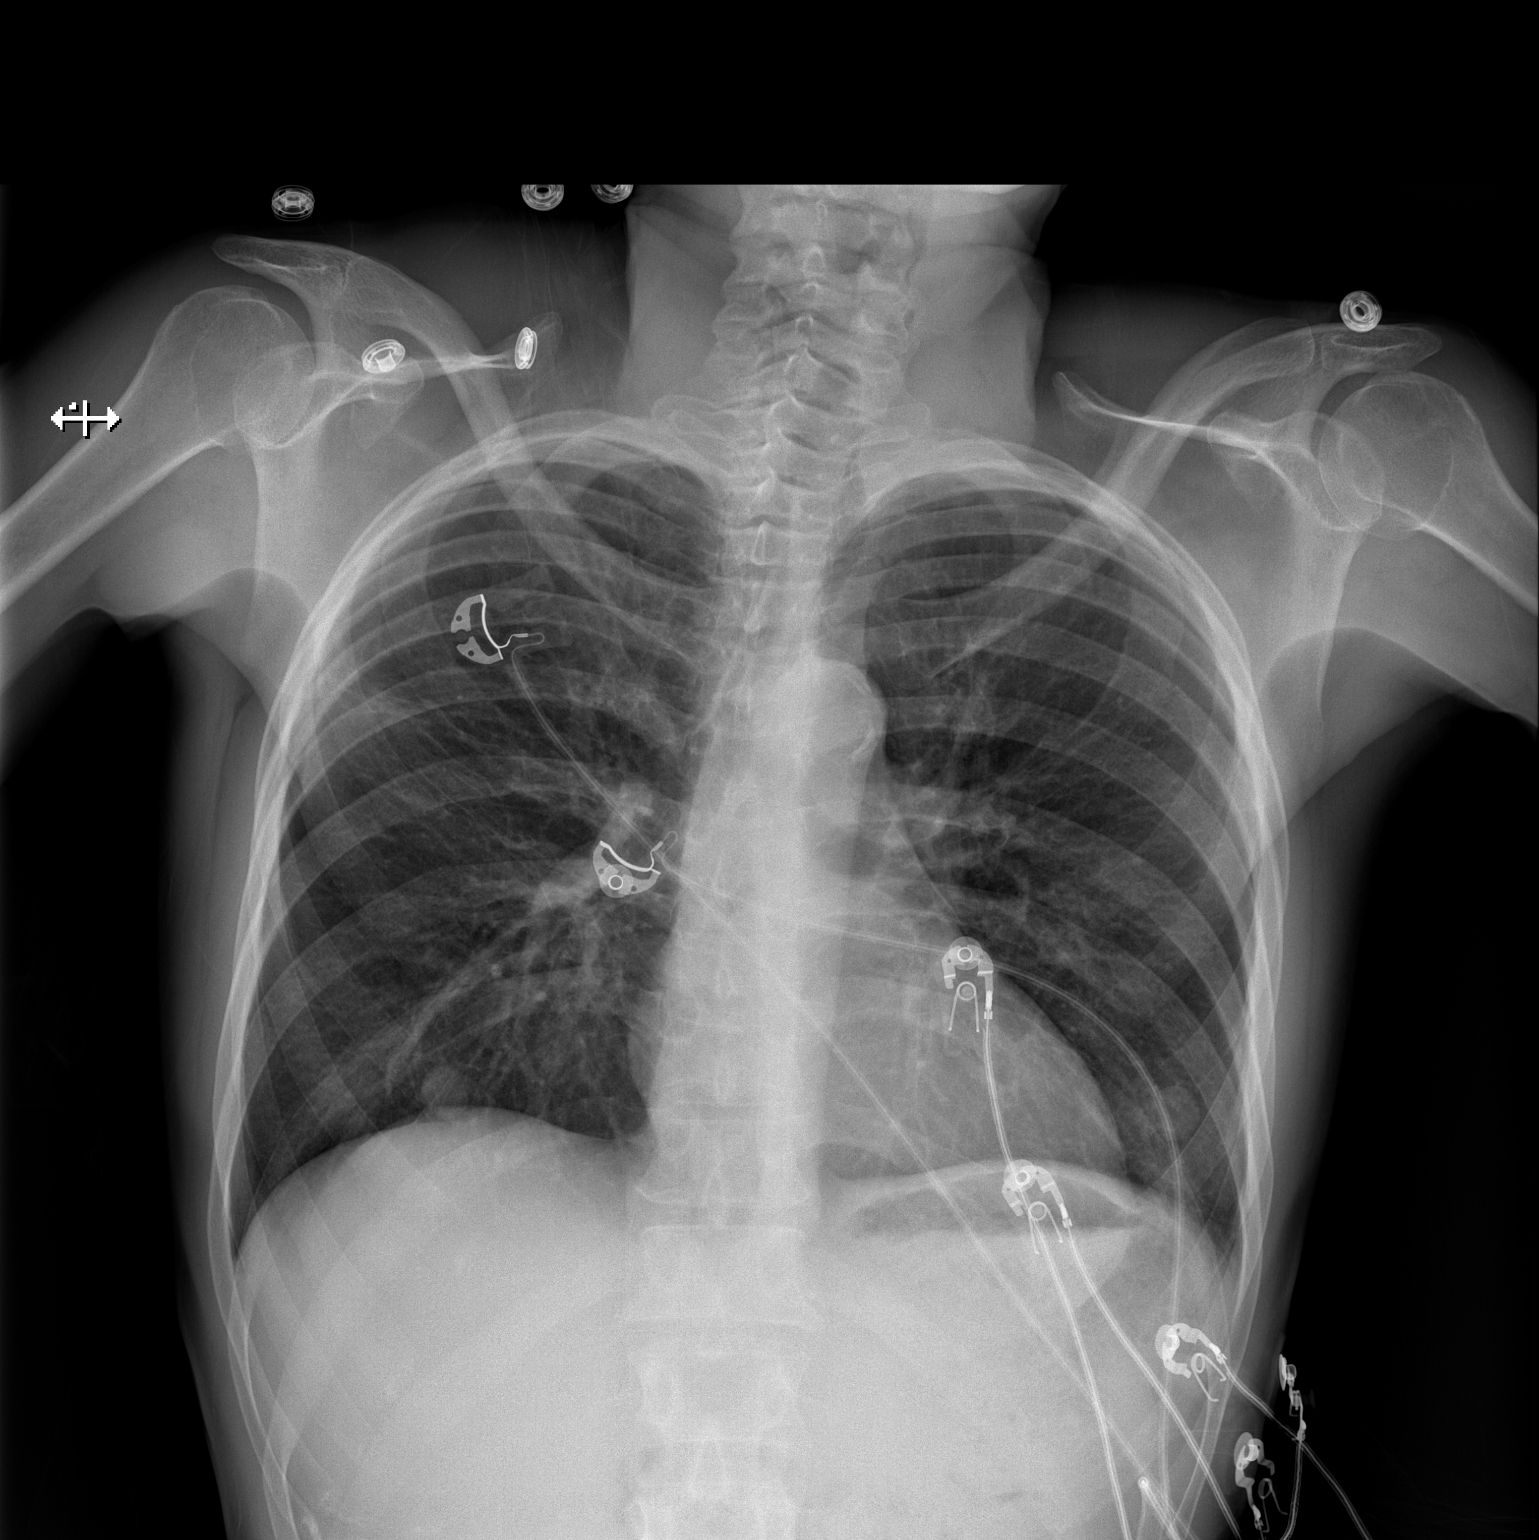

[w chest lat]
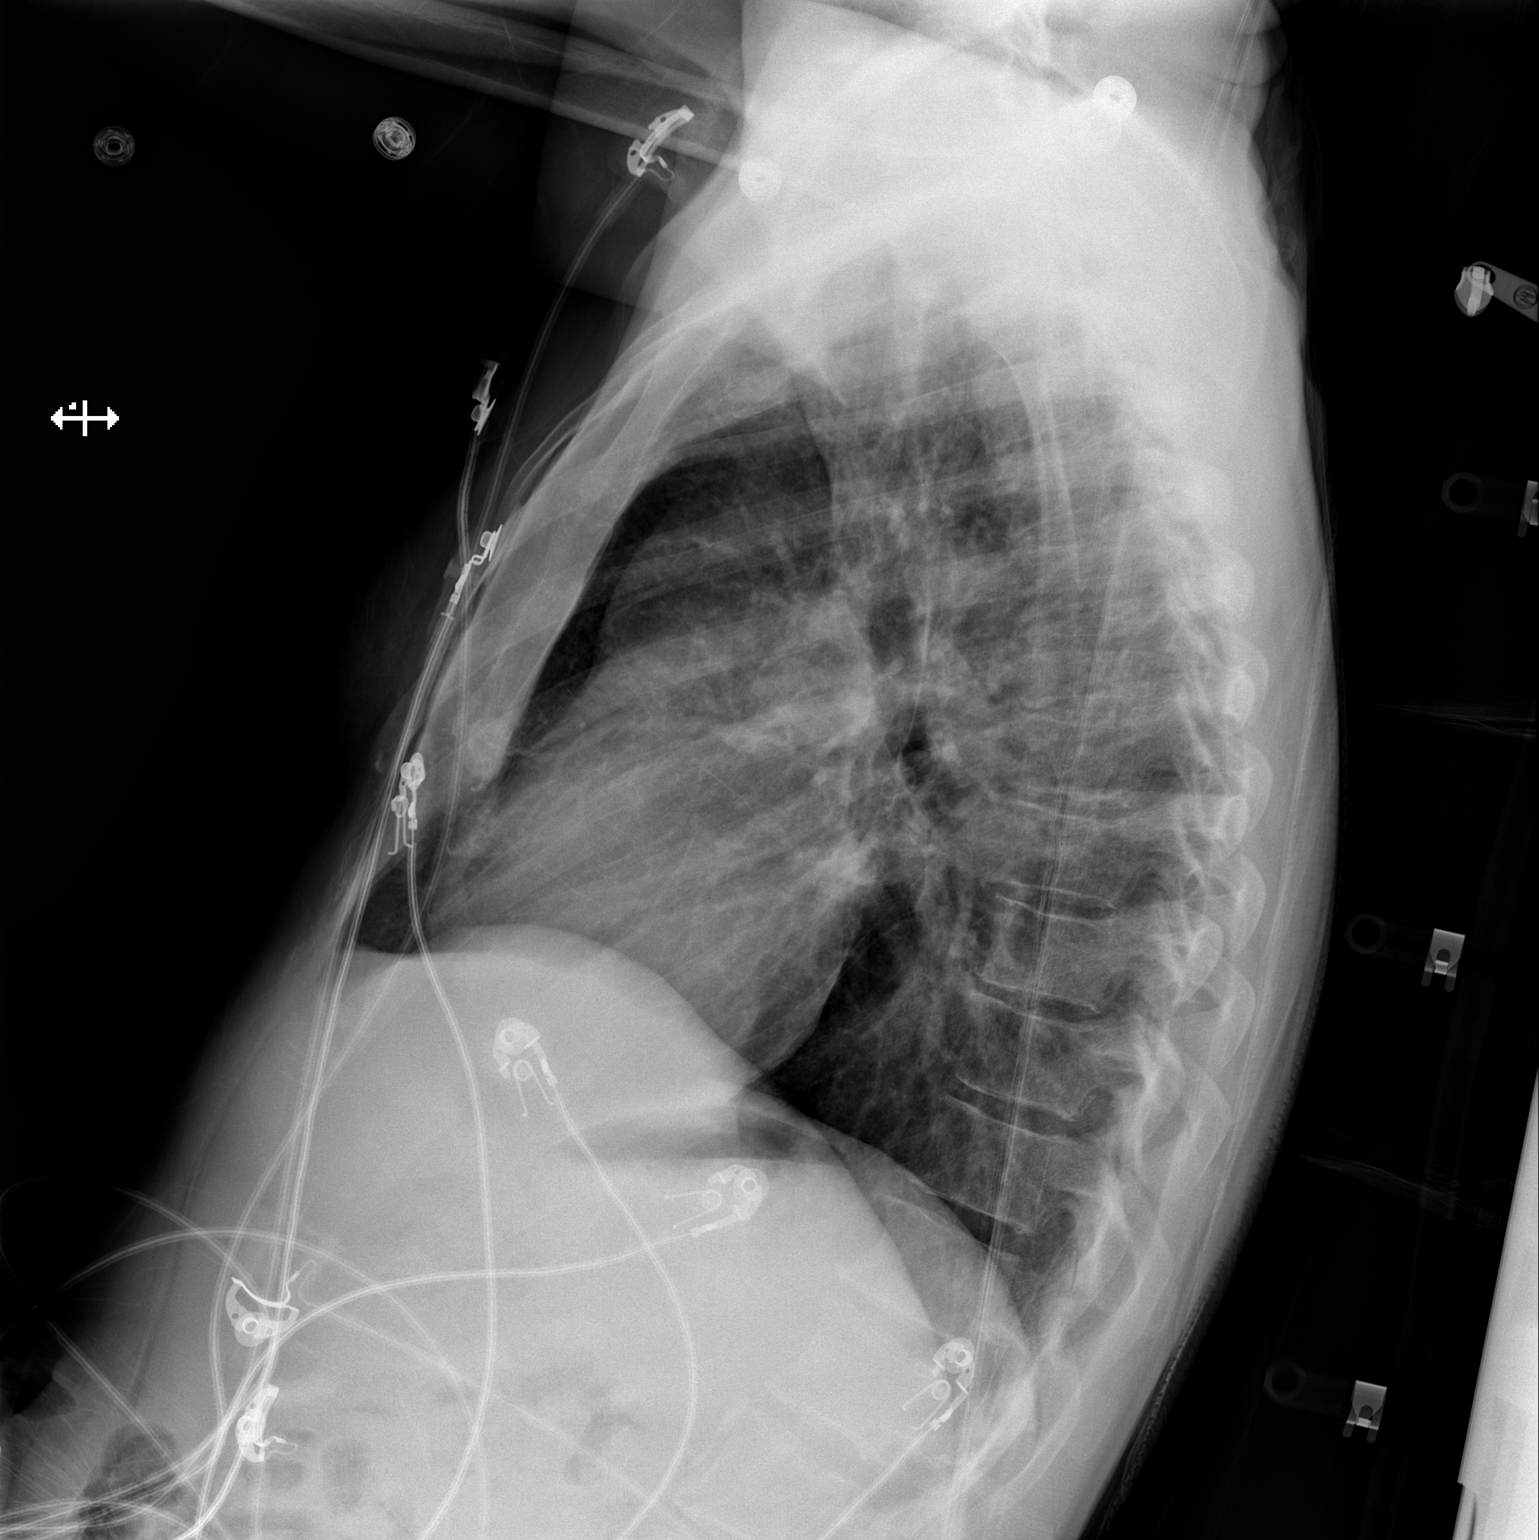

[2 of 2 positions shown; findings below may reference images not displayed]

FINDINGS: The heart size and mediastinal contours are within normal limits.
Mild scarring in the upper lung zones. BILATERAL nipple shadows. No
effusion or pneumothorax. No acute osseous findings. Thoracic
atherosclerosis.
IMPRESSION: No active cardiopulmonary disease.

## 2017-10-28 NOTE — Telephone Encounter (Signed)
[name], Thank you for giving them a call. I called last night and unfortunately they could not tell me which pharmacy she would need to get it from.

## 2017-10-28 NOTE — Telephone Encounter (Signed)
Rtc, mailbox full, went straight to [facility], script is on hold [facility]

## 2017-10-28 NOTE — Telephone Encounter (Signed)
Patient calling said her pain medicine was sent to another pharmacy, she we to [facility] but medicaid has her locked. Pls call patient

## 2017-12-10 ENCOUNTER — Encounter (HOSPITAL_COMMUNITY): Payer: Self-pay

## 2017-12-10 ENCOUNTER — Emergency Department (HOSPITAL_COMMUNITY)
Admission: EM | Admit: 2017-12-10 | Discharge: 2017-12-11 | Disposition: A | Discharge status: Home / Self Care | Payer: Medicaid Other | Attending: Emergency Medicine | Admitting: Emergency Medicine

## 2017-12-10 DIAGNOSIS — Y9389 Activity, other specified: Secondary | ICD-10-CM | POA: Insufficient documentation

## 2017-12-10 DIAGNOSIS — W19XXXA Unspecified fall, initial encounter: Secondary | ICD-10-CM

## 2017-12-10 DIAGNOSIS — F17210 Nicotine dependence, cigarettes, uncomplicated: Secondary | ICD-10-CM | POA: Diagnosis not present

## 2017-12-10 DIAGNOSIS — Y9289 Other specified places as the place of occurrence of the external cause: Secondary | ICD-10-CM | POA: Diagnosis not present

## 2017-12-10 DIAGNOSIS — M25561 Pain in right knee: Secondary | ICD-10-CM | POA: Insufficient documentation

## 2017-12-10 DIAGNOSIS — M25562 Pain in left knee: Secondary | ICD-10-CM | POA: Insufficient documentation

## 2017-12-10 DIAGNOSIS — S8992XA Unspecified injury of left lower leg, initial encounter: Secondary | ICD-10-CM | POA: Diagnosis not present

## 2017-12-10 DIAGNOSIS — M25552 Pain in left hip: Secondary | ICD-10-CM | POA: Insufficient documentation

## 2017-12-10 DIAGNOSIS — S8991XA Unspecified injury of right lower leg, initial encounter: Secondary | ICD-10-CM | POA: Diagnosis not present

## 2017-12-10 DIAGNOSIS — Y999 Unspecified external cause status: Secondary | ICD-10-CM | POA: Diagnosis not present

## 2017-12-10 DIAGNOSIS — S79912A Unspecified injury of left hip, initial encounter: Secondary | ICD-10-CM | POA: Diagnosis present

## 2017-12-10 DIAGNOSIS — I10 Essential (primary) hypertension: Secondary | ICD-10-CM | POA: Insufficient documentation
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
## 2017-12-11 ENCOUNTER — Emergency Department (HOSPITAL_COMMUNITY): Payer: Medicaid Other

## 2017-12-11 MED ORDER — OXYCODONE HCL 5 MG PO TABS
5.0000 mg | ORAL_TABLET | Freq: Once | ORAL | Status: AC
  Administered 2017-12-11: 5 mg via ORAL
  Filled 2017-12-11: qty 1

## 2017-12-11 NOTE — Discharge Instructions (Signed)
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
## 2017-12-11 NOTE — ED Notes (Signed)
PTAR called  

## 2017-12-14 ENCOUNTER — Emergency Department (HOSPITAL_COMMUNITY)
Admission: EM | Admit: 2017-12-14 | Discharge: 2017-12-14 | Disposition: A | Discharge status: Home / Self Care | Payer: Medicaid Other | Attending: Emergency Medicine | Admitting: Emergency Medicine

## 2017-12-14 ENCOUNTER — Encounter (HOSPITAL_COMMUNITY): Payer: Self-pay

## 2017-12-14 DIAGNOSIS — Z79899 Other long term (current) drug therapy: Secondary | ICD-10-CM | POA: Insufficient documentation

## 2017-12-14 DIAGNOSIS — F17210 Nicotine dependence, cigarettes, uncomplicated: Secondary | ICD-10-CM | POA: Insufficient documentation

## 2017-12-14 DIAGNOSIS — Y939 Activity, unspecified: Secondary | ICD-10-CM | POA: Diagnosis not present

## 2017-12-14 DIAGNOSIS — I10 Essential (primary) hypertension: Secondary | ICD-10-CM | POA: Insufficient documentation

## 2017-12-14 DIAGNOSIS — S8012XA Contusion of left lower leg, initial encounter: Secondary | ICD-10-CM | POA: Insufficient documentation

## 2017-12-14 DIAGNOSIS — S8011XA Contusion of right lower leg, initial encounter: Secondary | ICD-10-CM | POA: Diagnosis not present

## 2017-12-14 DIAGNOSIS — S8991XA Unspecified injury of right lower leg, initial encounter: Secondary | ICD-10-CM | POA: Diagnosis present

## 2017-12-14 DIAGNOSIS — Y999 Unspecified external cause status: Secondary | ICD-10-CM | POA: Diagnosis not present

## 2017-12-14 DIAGNOSIS — Y929 Unspecified place or not applicable: Secondary | ICD-10-CM | POA: Insufficient documentation

## 2017-12-14 MED ORDER — HYDROCODONE-IBUPROFEN 7.5-200 MG PO TABS: 1.0000 | ORAL_TABLET | Freq: Four times a day (QID) | ORAL | 0 refills | Status: DC | PRN

## 2017-12-14 MED ORDER — HYDROMORPHONE HCL 2 MG/ML IJ SOLN
2.0000 mg | Freq: Once | INTRAMUSCULAR | Status: AC
  Administered 2017-12-14: 2 mg via INTRAMUSCULAR
  Filled 2017-12-14: qty 1

## 2017-12-14 NOTE — ED Notes (Signed)
Bed: WLPT2 Expected date:  Expected time:  Means of arrival:  Comments: 

## 2017-12-14 NOTE — ED Notes (Signed)
Bed: WTR8 Expected date:  Expected time:  Means of arrival:  Comments: 

## 2017-12-14 NOTE — ED Triage Notes (Signed)
Pt complains of bilateral leg pain from falling out of her wheelchair on Sunday, pt was seen at [facility] and her xrays were negative, pt states that the pain is severe

## 2017-12-14 NOTE — ED Provider Notes (Signed)
[facility] DEPT Provider Note   CSN: [id number] Arrival date & time: 12/14/17  2353     History   Chief Complaint Chief Complaint  Patient presents with  . Leg Pain    HPI [name] is a 57 y.o. female.  The history is provided by the patient. No language interpreter was used.  Leg Pain   This is a new problem. The problem occurs constantly. The problem has been gradually worsening. The quality of the pain is described as aching. She has tried nothing for the symptoms. The treatment provided no relief.  Pt fell on 3/9.  Pt hit bilat stumps.  Pt complains of severe pain.  Pt yelling and picking at stumps.   Past Medical History:  Diagnosis Date  . CAP (community acquired pneumonia) 03/2014 X 2  . Cocaine abuse ([place])    ongoing with resultant vaculitis.  . Depression   . Headache    "weekly" (07/29/2016)  . Hypertension   . Inflammatory arthritis   . Migraines    "probably 5-6/yr" (07/29/2016)  . Normocytic anemia    BL Hgb 9.8-12. Last anemia panel 04/2010 - showing Fe 19, ferritin 101.  Pt on monthly B12 injections  . Rheumatoid arthritis(714.0)    patient reported  . 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 Followed by Dr. Louanne Skye    Patient Active Problem List   Diagnosis Date Noted  . Ulcer of amputation stump of lower extremity ([place]) 10/23/2017  . 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 Hypertensive crisis   . Phantom limb pain ([name])   . S/P bilateral below knee amputation ([place]) 04/13/2017  . Tobacco abuse   . Post-operative pain   . Acute blood loss anemia   . 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 Wound infection 03/30/2017  . AKI (acute kidney injury) ([place]) 02/07/2017  . Acute kidney injury ([place]) 02/06/2017  . Wound healing, delayed   . Chest pain 04/07/2015  . Protein-calorie  malnutrition, severe ([place]) 01/15/2015  . MDD (major depressive disorder), recurrent episode, severe ([place]) 10/16/2014  . Cocaine use disorder, severe, dependence ([place]) 10/10/2014  . 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 Essential hypertension 02/26/2010    Past Surgical History:  Procedure Laterality Date  . AMPUTATION Left 05/22/2016   Procedure: AMPUTATION LEFT LONG FINGER;  Surgeon: Marybelle Killings, MD;  Location: [facility];  Service: Orthopedics;  Laterality: Left;  . AMPUTATION Bilateral 04/10/2017   Procedure: AMPUTATION BELOW KNEE;  Surgeon: Newt Minion, MD;  Location: [facility];  Service: Orthopedics;  Laterality: Bilateral;  . HERNIA REPAIR     "stomach"  . I&D EXTREMITY Right 09/26/2015   Procedure: IRRIGATION AND DEBRIDEMENT LEG WOUND  VAC PLACEMENT.;  Surgeon: Loel Lofty Dillingham, DO;  Location: [facility];  Service: Plastics;  Laterality: Right;  . INCISION AND DRAINAGE OF WOUND Bilateral 10/20/2016   Procedure: IRRIGATION AND DEBRIDEMENT WOUND BILATERAL;  Surgeon: Edrick Kins, DPM;  Location: [facility];  Service: Podiatry;  Laterality: Bilateral;  . IRRIGATION AND DEBRIDEMENT ABSCESS Bilateral 09/26/2013   Procedure: DEBRIDEMENT ULCERS BILATERAL THIGHS;  Surgeon: Gwenyth Ober, MD;  Location: [facility];  Service: General;  Laterality: Bilateral;  . SKIN BIOPSY Bilateral    shin nodules    OB History    No data available       Home Medications  Prior to Admission medications   Medication Sig Start Date End Date Taking? Authorizing Provider  amLODipine (NORVASC) 10 MG tablet Take 1 tablet (10 mg total) by mouth daily. Patient not taking: Reported on 10/09/2017 09/14/17   Frederica Kuster, PA-C  busPIRone (BUSPAR) 15 MG tablet Take 1 tablet (15 mg total) by mouth 3 (three) times daily. Patient not taking: Reported on 08/09/2017 07/26/17   Scot Jun, FNP  cyclobenzaprine (FLEXERIL) 5 MG tablet Take 1 tablet (5 mg total) by mouth 3 (three) times daily as needed (muscle  soreness). Patient not taking: Reported on 08/09/2017 07/04/17   Rolland Porter, MD  escitalopram (LEXAPRO) 10 MG tablet Take 2 tablets (20 mg total) by mouth at bedtime. Patient not taking: Reported on 08/09/2017 07/26/17   Scot Jun, FNP  gabapentin (NEURONTIN) 600 MG tablet Take 1 tablet (600 mg total) by mouth 3 (three) times daily. Patient not taking: Reported on 08/09/2017 07/26/17   Scot Jun, FNP  hydrochlorothiazide (HYDRODIURIL) 25 MG tablet Take 1 tablet (25 mg total) daily by mouth. Patient not taking: Reported on 09/14/2017 08/11/17   Dorie Rank, MD  potassium chloride (K-DUR) 10 MEQ tablet Take 1 tablet (10 mEq total) daily by mouth. Patient not taking: Reported on 09/14/2017 08/11/17   Dorie Rank, MD    Family History Family History  Problem Relation Age of Onset  . Breast cancer Mother        Breast cancer  . Alcohol abuse Mother   . Colon cancer Maternal Aunt 61  . Alcohol abuse Father     Social History Social History   Tobacco Use  . Smoking status: Current Every Day Smoker    Packs/day: 0.12    Years: 38.00    Pack years: 4.56    Types: Cigarettes  . Smokeless tobacco: Never Used  . Tobacco comment: 2 A DAY  Substance Use Topics  . Alcohol use: No    Alcohol/week: 0.0 oz  . Drug use: Yes    Types: "Crack" cocaine, Cocaine    Comment: Smoked crack 06/02/2017     Allergies   Acetaminophen and Lisinopril   Review of Systems Review of Systems  Skin: Positive for wound.  All other systems reviewed and are negative.    Physical Exam Updated Vital Signs BP (!) 185/79   Pulse (!) 108   Temp 98 F (36.7 C) (Oral)   Resp 20   SpO2 98%   Physical Exam  Constitutional: She is oriented to person, place, and time. She appears well-developed and well-nourished.  Musculoskeletal: Normal range of motion.  Neurological: She is alert and oriented to person, place, and time.  Skin: Skin is warm.  Bleeding bilat stumps,  Pt picking at skin.      Psychiatric: She has a normal mood and affect.  Nursing note and vitals reviewed.    ED Treatments / Results  Labs (all labs ordered are listed, but only abnormal results are displayed) Labs Reviewed - No data to display  EKG  EKG Interpretation None       Radiology No results found.  Procedures Procedures (including critical care time)  Medications Ordered in ED Medications  HYDROmorphone (DILAUDID) injection 2 mg (2 mg Intramuscular Given 12/14/17 0370)     Initial Impression / Assessment and Plan / ED Course  I have reviewed the triage vital signs and the nursing notes.  Pertinent labs & imaging results that were available during my care of the patient were reviewed  by me and considered in my medical decision making (see chart for details).     Pt given dilaudid IM.   Rx for vicoprofen10 tablets.  Wounds wrapped.  Pt advised to follow up with her MD.   Final Clinical Impressions(s) / ED Diagnoses   Final diagnoses:  Contusion of left lower leg, initial encounter  Contusion of right lower extremity, initial encounter    ED Discharge Orders    None    An After Visit Summary was printed and given to the patient. Meds ordered this encounter  Medications  . HYDROmorphone (DILAUDID) injection 2 mg  . HYDROcodone-ibuprofen (VICOPROFEN) 7.5-200 MG tablet    Sig: Take 1 tablet by mouth every 6 (six) hours as needed for moderate pain.    Dispense:  10 tablet    Refill:  0    Order Specific Question:   Supervising Provider    Answer:   Noemi Chapel [[id number]]      Fransico Meadow, PA-C 12/14/17 1031    Carmin Muskrat, MD 12/17/17 1320

## 2017-12-14 NOTE — ED Notes (Signed)
Bed: WN46 Expected date:  Expected time:  Means of arrival:  Comments: Pt getting dressed

## 2017-12-14 NOTE — Discharge Instructions (Signed)
Do not pick at wounds

## 2017-12-17 ENCOUNTER — Encounter (HOSPITAL_COMMUNITY): Payer: Self-pay

## 2017-12-17 ENCOUNTER — Other Ambulatory Visit: Payer: Self-pay

## 2017-12-17 ENCOUNTER — Emergency Department (HOSPITAL_COMMUNITY)
Admission: EM | Admit: 2017-12-17 | Discharge: 2017-12-17 | Disposition: A | Discharge status: Home / Self Care | Payer: Medicaid Other | Attending: Emergency Medicine | Admitting: Emergency Medicine

## 2017-12-17 ENCOUNTER — Emergency Department (HOSPITAL_COMMUNITY): Payer: Medicaid Other

## 2017-12-17 DIAGNOSIS — M79604 Pain in right leg: Secondary | ICD-10-CM | POA: Diagnosis not present

## 2017-12-17 DIAGNOSIS — F17210 Nicotine dependence, cigarettes, uncomplicated: Secondary | ICD-10-CM | POA: Insufficient documentation

## 2017-12-17 DIAGNOSIS — I10 Essential (primary) hypertension: Secondary | ICD-10-CM | POA: Insufficient documentation

## 2017-12-17 DIAGNOSIS — M79605 Pain in left leg: Secondary | ICD-10-CM | POA: Insufficient documentation

## 2017-12-17 DIAGNOSIS — W19XXXA Unspecified fall, initial encounter: Secondary | ICD-10-CM

## 2017-12-17 MED ORDER — OXYCODONE HCL 5 MG PO TABS
5.0000 mg | ORAL_TABLET | Freq: Once | ORAL | Status: AC
  Administered 2017-12-17: 5 mg via ORAL
  Filled 2017-12-17: qty 1

## 2017-12-17 NOTE — Discharge Instructions (Signed)
Fill and take your pain medication Rx from your primary care physician that you have at home.  You will to need to have pain as long as you are using cocaine.  You should strongly consider not using cocaine.

## 2017-12-17 NOTE — ED Provider Notes (Addendum)
[facility] EMERGENCY DEPARTMENT Provider Note   CSN: [id number] Arrival date & time: 12/17/17  0710     History   Chief Complaint Chief Complaint  Patient presents with  . Leg Pain    HPI [name] is a 57 y.o. female.  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  Recurrent chronic pain of the bilateral extremities.  Multiple visits here.  Presented 5, and 2 days ago complaining of a fall.  Apparently he may have fallen again a few days ago.  Has chronic pain.  Is worsening of her pain.  States she supposed to pick up a prescription today.  She does not know if it is ibuprofen or hydrocodone.  No fevers, chills, vomiting, or other systemic signs of illness.  Past Medical History:  Diagnosis Date  . CAP (community acquired pneumonia) 03/2014 X 2  . Cocaine abuse ([place])    ongoing with resultant vaculitis.  . Depression   . Headache    "weekly" (07/29/2016)  . Hypertension   . Inflammatory arthritis   . Migraines    "probably 5-6/yr" (07/29/2016)  . Normocytic anemia    BL Hgb 9.8-12. Last anemia panel 04/2010 - showing Fe 19, ferritin 101.  Pt on monthly B12 injections  . Rheumatoid arthritis(714.0)    patient reported  . 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 Followed by Dr. Louanne Skye    Patient Active Problem List   Diagnosis Date Noted  . Ulcer of amputation stump of lower extremity ([place]) 10/23/2017  . 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 Hypertensive crisis   . Phantom limb pain ([place])   . S/P bilateral below knee amputation ([place]) 04/13/2017  . Tobacco abuse   . Post-operative pain   . Acute blood loss anemia   . 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 Wound infection 03/30/2017    . AKI (acute kidney injury) ([place]) 02/07/2017  . Acute kidney injury ([place]) 02/06/2017  . Wound healing, delayed   . Chest pain 04/07/2015  . Protein-calorie malnutrition, severe ([place]) 01/15/2015  . MDD (major depressive disorder), recurrent episode, severe ([place]) 10/16/2014  . Cocaine use disorder, severe, dependence ([place]) 10/10/2014  . 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 Essential hypertension 02/26/2010    Past Surgical History:  Procedure Laterality Date  . AMPUTATION Left 05/22/2016   Procedure: AMPUTATION LEFT LONG FINGER;  Surgeon: Marybelle Killings, MD;  Location: [facility];  Service: Orthopedics;  Laterality: Left;  . AMPUTATION Bilateral 04/10/2017   Procedure: AMPUTATION BELOW KNEE;  Surgeon: Newt Minion, MD;  Location: [facility];  Service: Orthopedics;  Laterality: Bilateral;  . HERNIA REPAIR     "stomach"  . I&D EXTREMITY Right 09/26/2015   Procedure: IRRIGATION AND DEBRIDEMENT LEG WOUND  VAC PLACEMENT.;  Surgeon: Loel Lofty Dillingham, DO;  Location: [facility];  Service: Plastics;  Laterality: Right;  . INCISION AND DRAINAGE OF WOUND Bilateral 10/20/2016   Procedure: IRRIGATION AND DEBRIDEMENT WOUND BILATERAL;  Surgeon: Edrick Kins, DPM;  Location: [facility];  Service: Podiatry;  Laterality: Bilateral;  . IRRIGATION AND DEBRIDEMENT ABSCESS Bilateral 09/26/2013   Procedure: DEBRIDEMENT ULCERS BILATERAL THIGHS;  Surgeon: Gwenyth Ober, MD;  Location: [facility];  Service: General;  Laterality: Bilateral;  .  SKIN BIOPSY Bilateral    shin nodules    OB History    No data available       Home Medications    Prior to Admission medications   Medication Sig Start Date End Date Taking? Authorizing Provider  amLODipine (NORVASC) 10 MG tablet Take 1 tablet (10 mg total) by mouth daily. Patient not taking: Reported on 10/09/2017 09/14/17   Frederica Kuster, PA-C  busPIRone (BUSPAR) 15 MG tablet Take 1 tablet (15 mg total) by mouth 3 (three) times daily. Patient not taking: Reported on  08/09/2017 07/26/17   Scot Jun, FNP  cyclobenzaprine (FLEXERIL) 5 MG tablet Take 1 tablet (5 mg total) by mouth 3 (three) times daily as needed (muscle soreness). Patient not taking: Reported on 08/09/2017 07/04/17   Rolland Porter, MD  escitalopram (LEXAPRO) 10 MG tablet Take 2 tablets (20 mg total) by mouth at bedtime. Patient not taking: Reported on 08/09/2017 07/26/17   Scot Jun, FNP  gabapentin (NEURONTIN) 600 MG tablet Take 1 tablet (600 mg total) by mouth 3 (three) times daily. Patient not taking: Reported on 08/09/2017 07/26/17   Scot Jun, FNP  hydrochlorothiazide (HYDRODIURIL) 25 MG tablet Take 1 tablet (25 mg total) daily by mouth. Patient not taking: Reported on 09/14/2017 08/11/17   Dorie Rank, MD  HYDROcodone-ibuprofen (VICOPROFEN) 7.5-200 MG tablet Take 1 tablet by mouth every 6 (six) hours as needed for moderate pain. Patient not taking: Reported on 12/17/2017 12/14/17   Carmin Muskrat, MD  potassium chloride (K-DUR) 10 MEQ tablet Take 1 tablet (10 mEq total) daily by mouth. Patient not taking: Reported on 09/14/2017 08/11/17   Dorie Rank, MD    Family History Family History  Problem Relation Age of Onset  . Breast cancer Mother        Breast cancer  . Alcohol abuse Mother   . Colon cancer Maternal Aunt 38  . Alcohol abuse Father     Social History Social History   Tobacco Use  . Smoking status: Current Every Day Smoker    Packs/day: 0.12    Years: 38.00    Pack years: 4.56    Types: Cigarettes  . Smokeless tobacco: Never Used  . Tobacco comment: 2 A DAY  Substance Use Topics  . Alcohol use: No    Alcohol/week: 0.0 oz  . Drug use: Yes    Types: "Crack" cocaine, Cocaine    Comment: Smoked crack 06/02/2017     Allergies   Acetaminophen and Lisinopril   Review of Systems Review of Systems  Constitutional: Negative for appetite change, chills, diaphoresis, fatigue and fever.  HENT: Negative for mouth sores, sore throat and trouble  swallowing.   Eyes: Negative for visual disturbance.  Respiratory: Negative for cough, chest tightness, shortness of breath and wheezing.   Cardiovascular: Negative for chest pain.  Gastrointestinal: Negative for abdominal distention, abdominal pain, diarrhea, nausea and vomiting.  Endocrine: Negative for polydipsia, polyphagia and polyuria.  Genitourinary: Negative for dysuria, frequency and hematuria.  Musculoskeletal: Negative for gait problem.       Bilateral lower extremity pain  Skin: Negative for color change, pallor and rash.  Neurological: Negative for dizziness, syncope, light-headedness and headaches.  Hematological: Does not bruise/bleed easily.  Psychiatric/Behavioral: Negative for behavioral problems and confusion.     Physical Exam Updated Vital Signs BP (!) 184/100   Pulse (!) 107   Temp 98.9 F (37.2 C)   Resp (!) 21   Wt 44.5 kg (98 lb)  SpO2 100%   BMI 18.52 kg/m   Physical Exam  Constitutional: She is oriented to person, place, and time. She appears well-developed and well-nourished. No distress.  HENT:  Head: Normocephalic.  Eyes: Conjunctivae are normal. Pupils are equal, round, and reactive to light. No scleral icterus.  Neck: Normal range of motion. Neck supple. No thyromegaly present.  Cardiovascular: Normal rate and regular rhythm. Exam reveals no gallop and no friction rub.  No murmur heard. Pulmonary/Chest: Effort normal and breath sounds normal. No respiratory distress. She has no wheezes. She has no rales.  Abdominal: Soft. Bowel sounds are normal. She exhibits no distension. There is no tenderness. There is no rebound.  Musculoskeletal:  Bilateral BKA's.  Chronic Tory changes around her incisions.  No frank thickness ulceration noted.  Has some areas of subcutaneous purpura.  Good capillary refill.  Neurological: She is alert and oriented to person, place, and time.  Skin: Skin is warm and dry. No rash noted.  Psychiatric: She has a normal  mood and affect. Her behavior is normal.     ED Treatments / Results  Labs (all labs ordered are listed, but only abnormal results are displayed) Labs Reviewed - No data to display  EKG  EKG Interpretation None       Radiology Dg Knee 1-2 Views Left  Result Date: 12/17/2017 CLINICAL DATA:  Fall. EXAM: LEFT KNEE - 1-2 VIEW COMPARISON:  12/11/2017 FINDINGS: Osteotomies in the proximal tibia and fibula. No fracture or dislocation. IMPRESSION: No fracture or dislocation. Electronically Signed   By: Suzy Bouchard M.D.   On: 12/17/2017 08:42   Dg Knee 1-2 Views Right  Result Date: 12/17/2017 CLINICAL DATA:  Laceration after fall. EXAM: RIGHT KNEE - 1-2 VIEW COMPARISON:  None. FINDINGS: No evidence of fracture, dislocation, or joint effusion. No evidence of arthropathy. Status post below-knee amputation. Soft tissues are unremarkable. IMPRESSION: No significant abnormality seen in the right knee. Electronically Signed   By: Marijo Conception, M.D.   On: 12/17/2017 08:41    Procedures Procedures (including critical care time)  Medications Ordered in ED Medications  oxyCODONE (Oxy IR/ROXICODONE) immediate release tablet 5 mg (not administered)  oxyCODONE (Oxy IR/ROXICODONE) immediate release tablet 5 mg (5 mg Oral Given 12/17/17 0754)     Initial Impression / Assessment and Plan / ED Course  I have reviewed the triage vital signs and the nursing notes.  Pertinent labs & imaging results that were available during my care of the patient were reviewed by me and considered in my medical decision making (see chart for details).   No acute fractures on x-ray.  No stapedius air.  Had a long discussion with the patient outlining the need for her to stop using cocaine less she have continued to chronic pain.  She has a prescription at home from her primary care physician for her pain.  I have encouraged her to use this.  She is discharged.    Final Clinical Impressions(s) / ED Diagnoses    Final diagnoses:  Bilateral leg pain    After discharge requested patient did complain of "tingling".  As I enter the room we had a long discussion about this.  She has normal neurological exam.  She states that it felt like she was tingling while she was "sobbing and crying".  She feels better now.  Appropriate for discharge.   ED Discharge Orders    None       Tanna Furry, MD 12/17/17 [phone]  Tanna Furry, MD 12/17/17 1007

## 2017-12-17 NOTE — ED Triage Notes (Addendum)
Pt presents to the ed with complaints of severe leg pain bilaterally. Pt is a bilateral BKA, states her surgery was 8 months ago, reports falling out of wheelchair on Sunday, skin is broken at incision site.  Pt reports severe pain since 2100 last night. Received 200 mcg of fentanyl with EMS

## 2017-12-17 NOTE — ED Notes (Signed)
Pt resting in bed comfortably.

## 2018-01-13 IMAGING — MR MR LUMBAR SPINE WO/W CM
6 of 7 series · 38 of 48 positions shown · IV contrast (multihance)
Comparison: CT abdomen and pelvis 10/12/2010 reviewed.

CLINICAL DATA: Left side low back pain radiating throughout the
back and into the left leg and foot with numbness and tingling in
the left leg. Symptoms began this morning. No known injury. Initial
encounter.

EXAM:
MRI LUMBAR SPINE WITHOUT AND WITH CONTRAST
TECHNIQUE: Multiplanar and multiecho pulse sequences of the lumbar spine were
obtained without and with intravenous contrast.
CONTRAST:  9mL MULTIHANCE GADOBENATE DIMEGLUMINE 529 MG/ML IV SOLN

[Series 3: T2 · sagittal · 4.0mm · 0.55mm/px · 4 of 12 slices shown (1 of 2)]
[im 1/12]
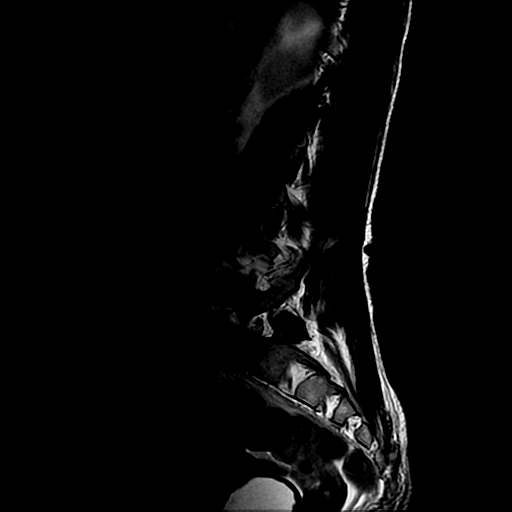
[im 4/12]
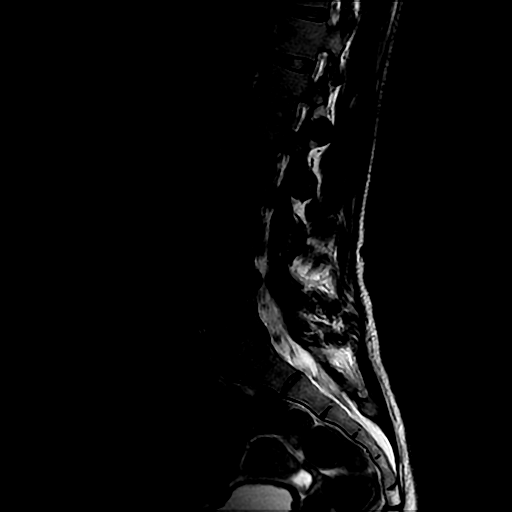
[im 8/12]
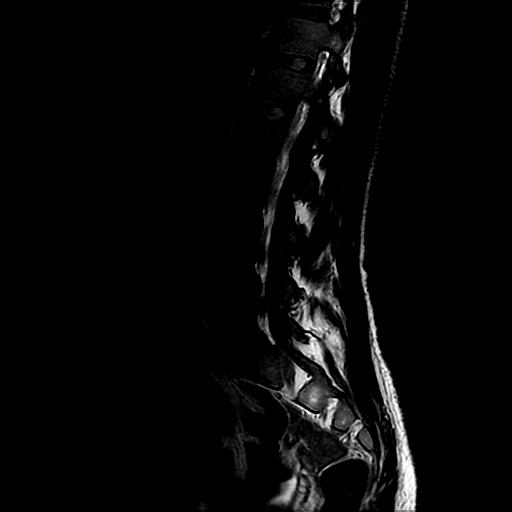
[im 12/12]
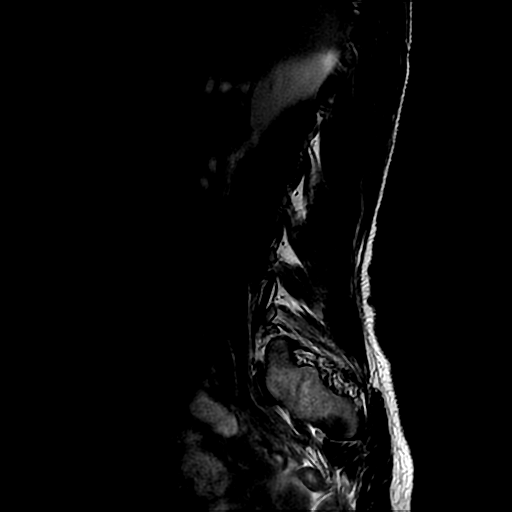

[Series 4: T1 · sagittal · 4.0mm · 0.55mm/px · 4 of 12 slices shown (1 of 2)]
[im 1/12]
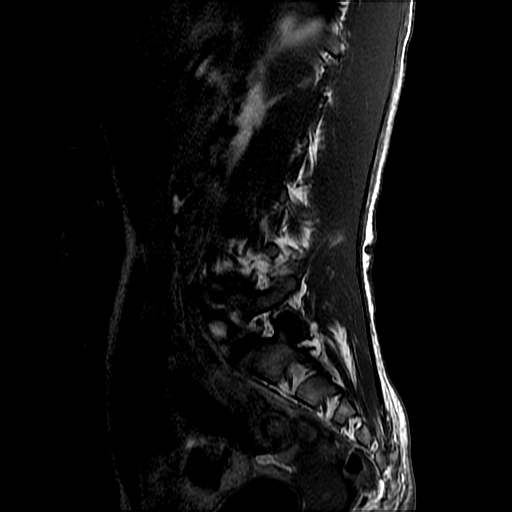
[im 4/12]
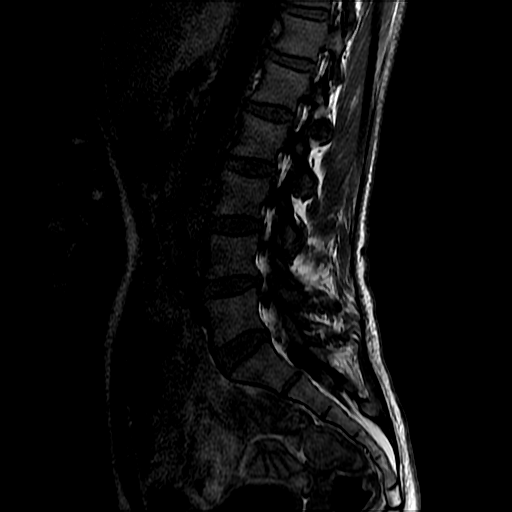
[im 8/12]
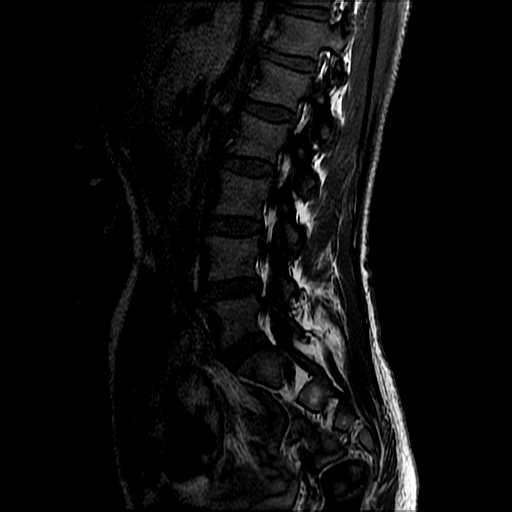
[im 12/12]
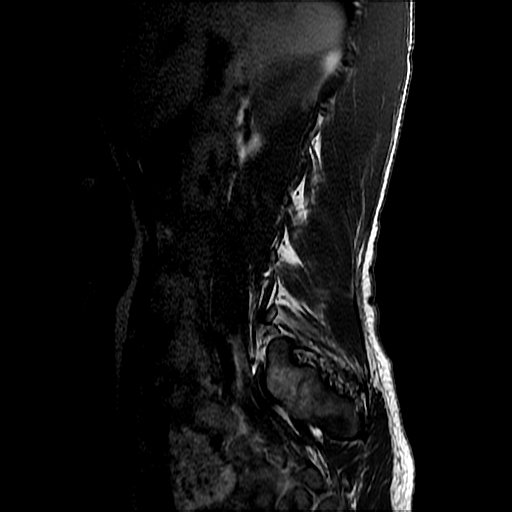

[Series 6: T2 · axial · 4.0mm · 0.78mm/px · z∈[-153,+59]mm · 11 of 39 slices shown (2 of 2)]
[im 1/39]
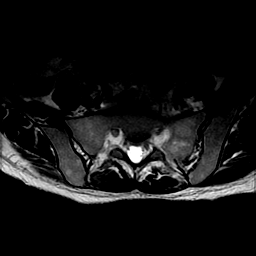
[im 4/39]
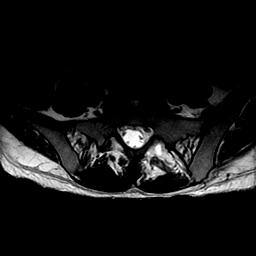
[im 8/39]
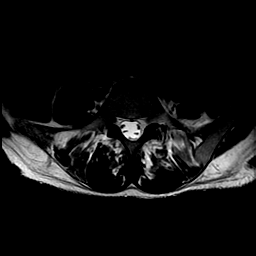
[im 12/39]
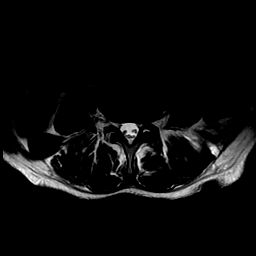
[im 16/39]
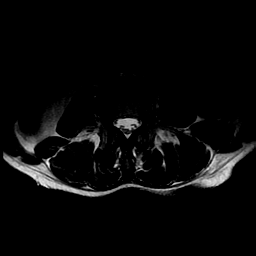
[im 20/39]
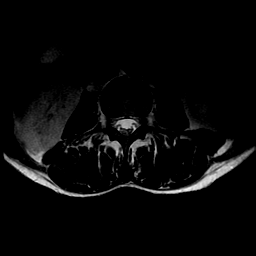
[im 23/39]
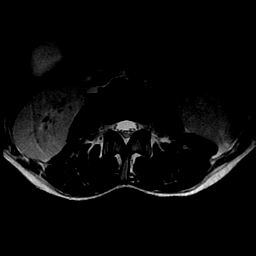
[im 27/39]
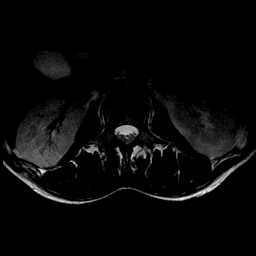
[im 31/39]
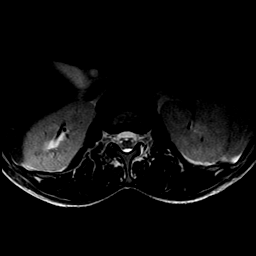
[im 35/39]
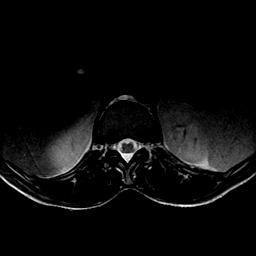
[im 39/39]
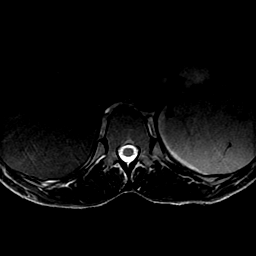

[Series 7: T1 · axial · 4.0mm · 0.78mm/px · z∈[-153,+59]mm · 11 of 39 slices shown (2 of 2)]
[im 1/39]
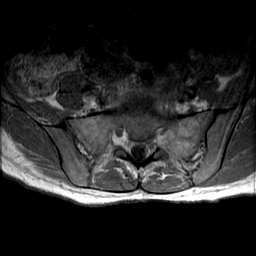
[im 4/39]
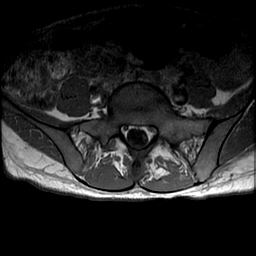
[im 8/39]
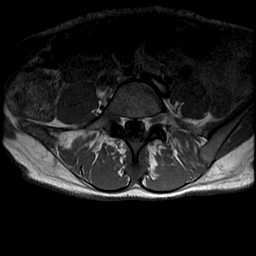
[im 12/39]
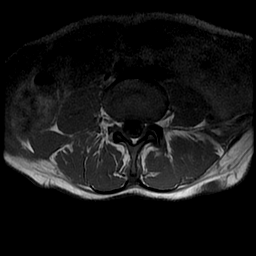
[im 16/39]
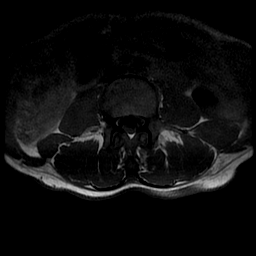
[im 20/39]
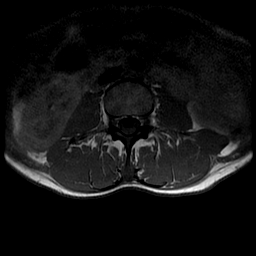
[im 23/39]
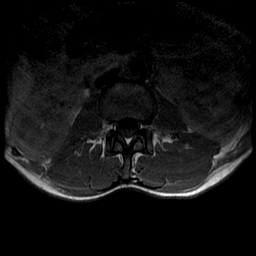
[im 27/39]
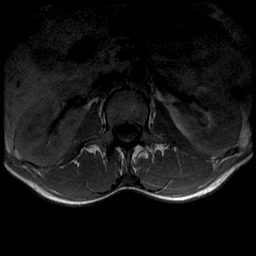
[im 31/39]
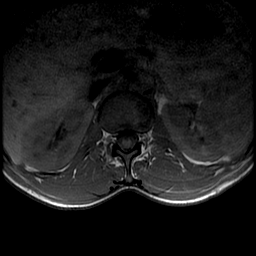
[im 35/39]
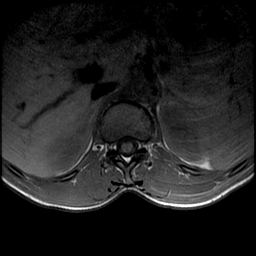
[im 39/39]
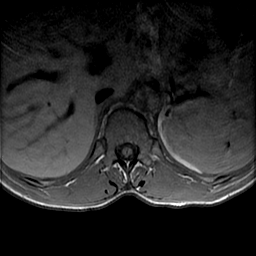

[Series 8: T1 post-contrast · sagittal · 4.0mm · 0.55mm/px · 3 of 12 slices shown (1 of 2)]
[im 1/12]
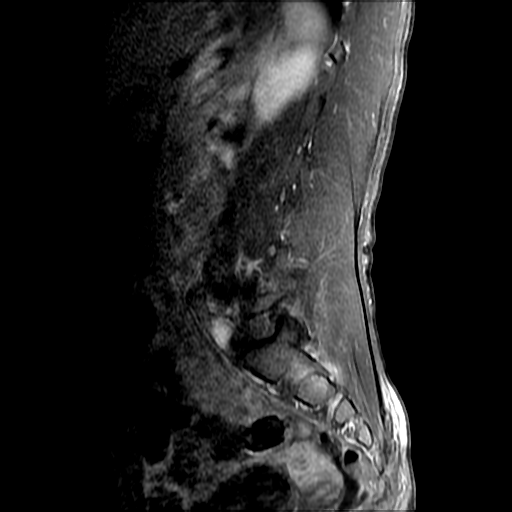
[im 6/12]
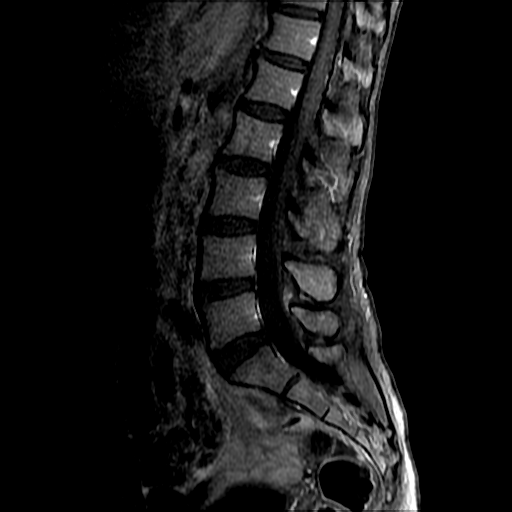
[im 12/12]
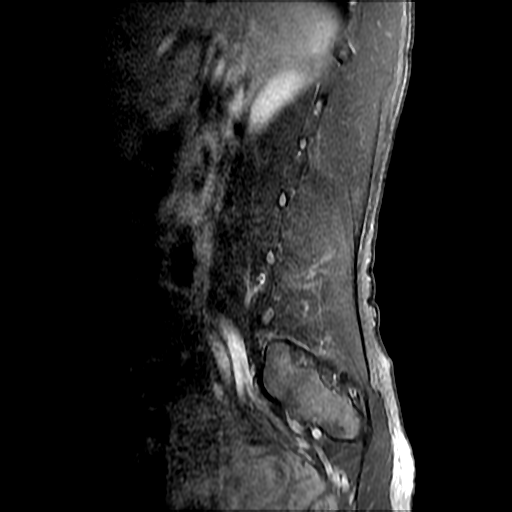

[Series 9: T1 post-contrast · axial · 4.0mm · 0.78mm/px · z∈[-153,-21]mm · 5 of 39 slices shown (2 of 2)]
[im 1/39]
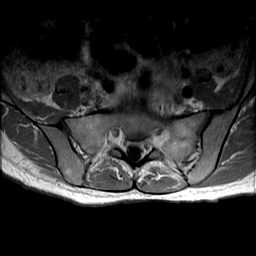
[im 8/39]
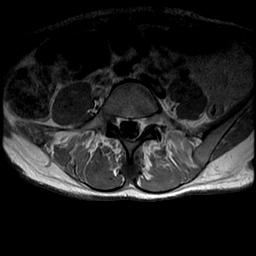
[im 12/39]
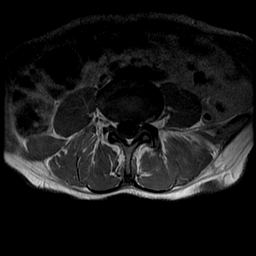
[im 16/39]
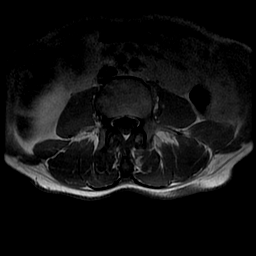
[im 23/39]
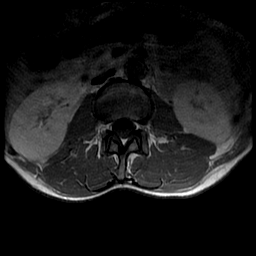

[38 of 48 positions shown; findings below may reference images not displayed]

FINDINGS: Vertebral body height, signal and alignment are normal. No
pathologic enhancement after contrast administration is identified.
The conus medullaris is normal in signal and position. Imaged
paraspinous structures are unremarkable.

L4-5: There is a small annular fissure and very shallow left
foraminal protrusion without foraminal stenosis. The central canal
and right foramen are widely patent.

Except as described above, intervertebral disc spaces are
unremarkable.
IMPRESSION: No finding to explain the patient's symptoms. Annular fissure and
very small left foraminal protrusion L4-5 without foraminal
stenosis. The exam is otherwise negative.

## 2018-01-13 IMAGING — DX DG CHEST 2V
2 series · 2 of 2 positions shown · non-contrast
Comparison: Multiple exams, including 11/14/2015

CLINICAL DATA: Chest pain.  Hypertension.  Tobacco use.

EXAM:
CHEST  2 VIEW

[w chest pa]
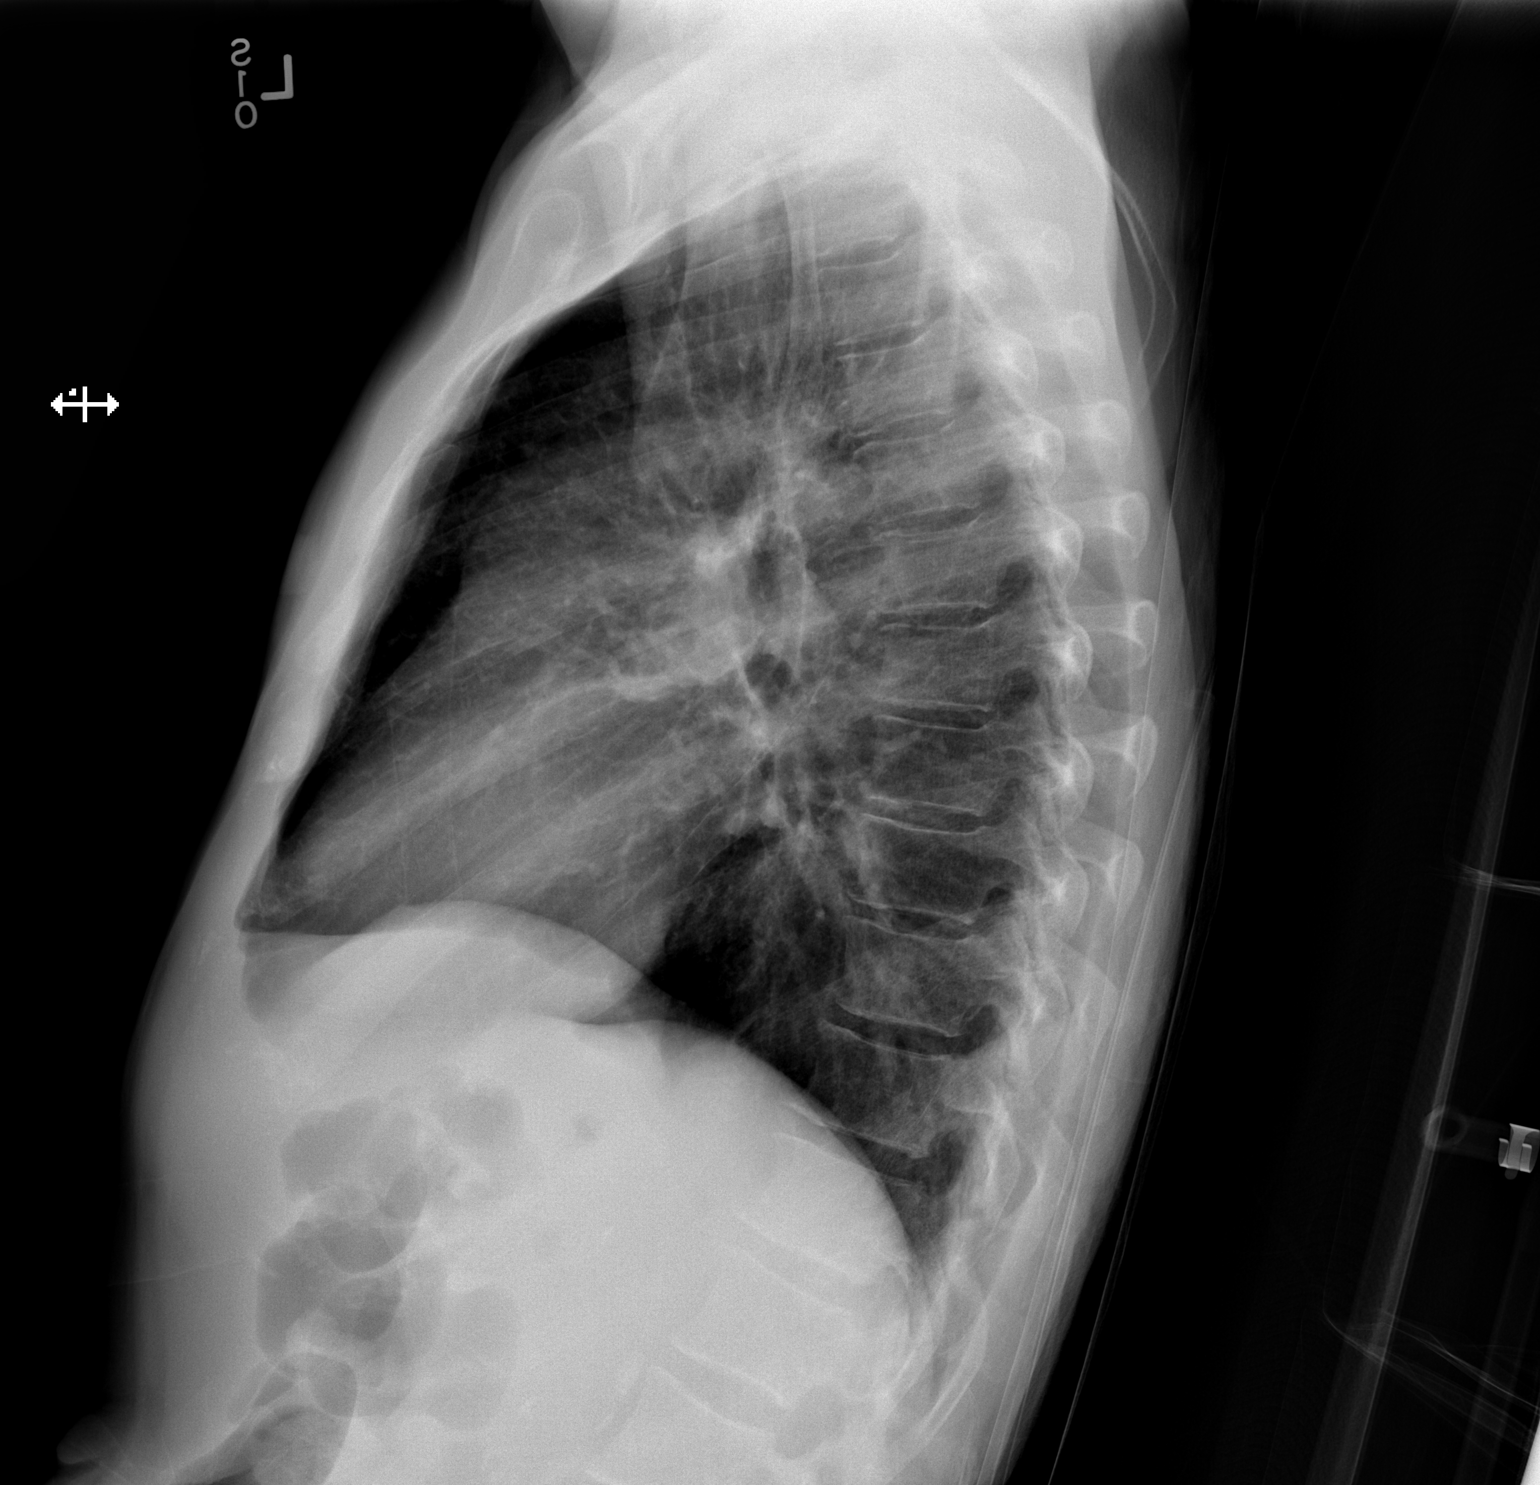

[x chest ap]
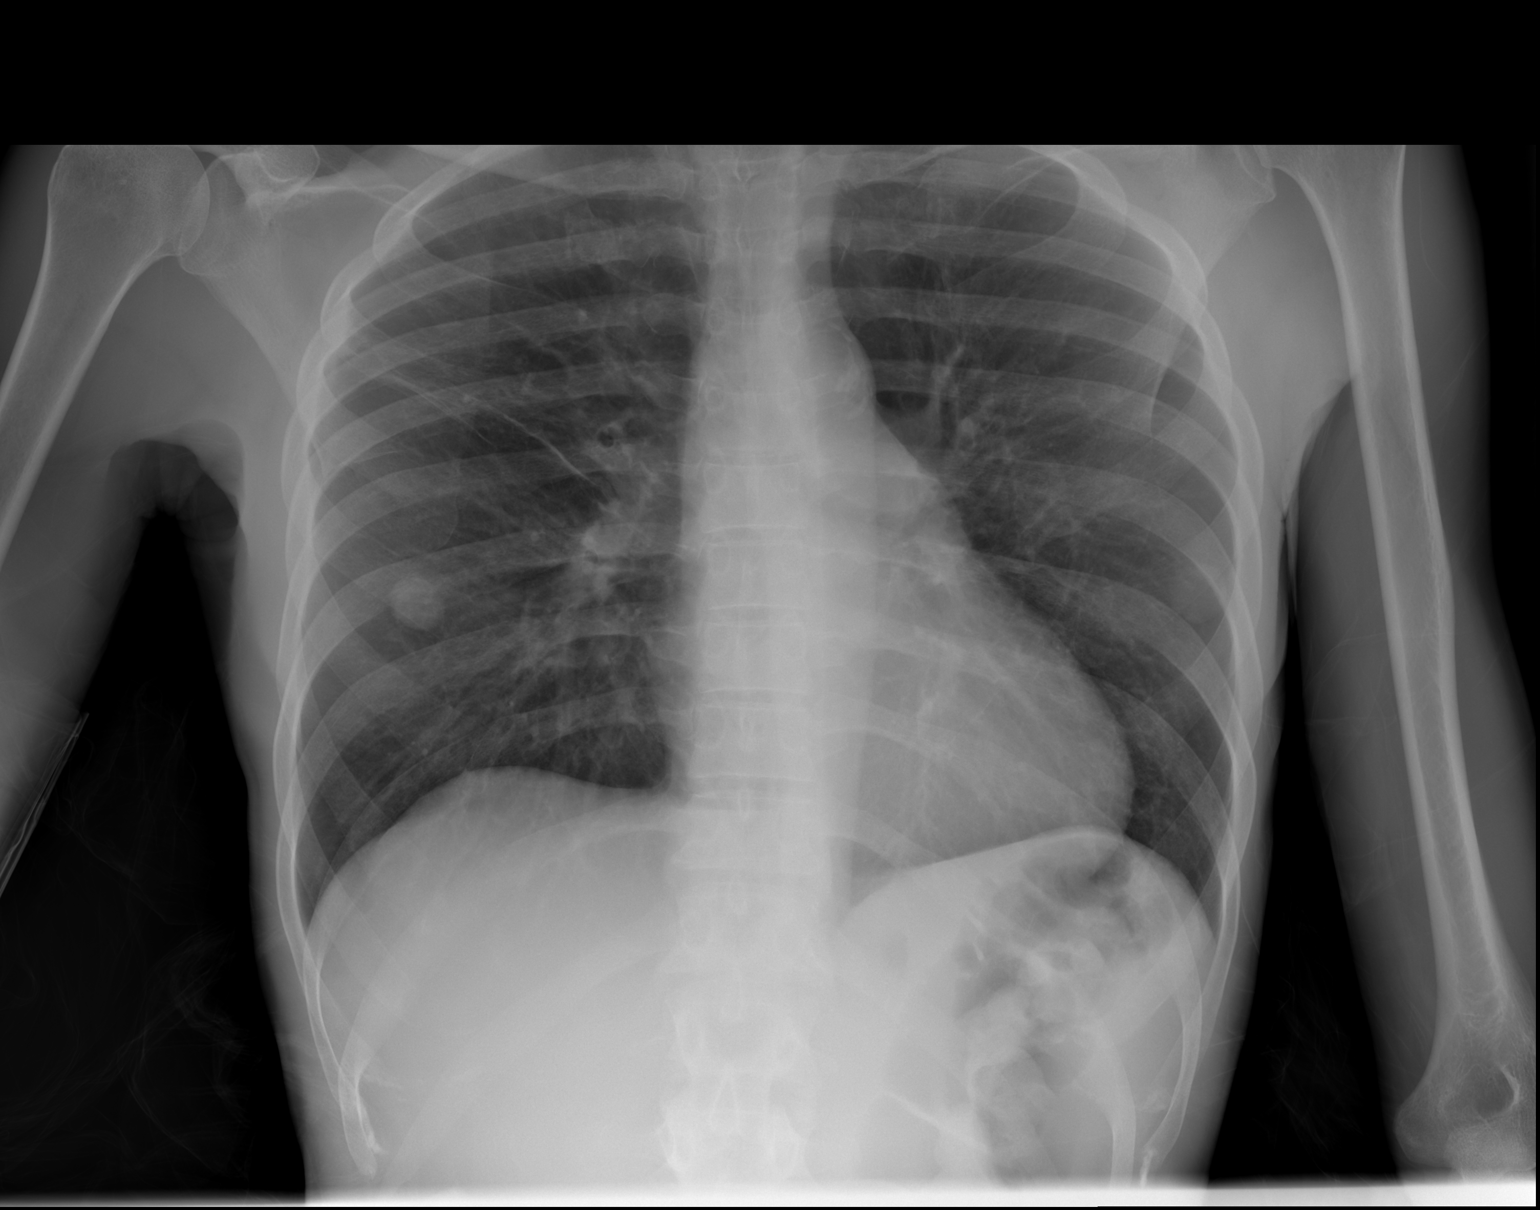

[2 of 2 positions shown; findings below may reference images not displayed]

FINDINGS: Bilateral asymmetric nodular densities projecting over the mid lungs
represent nipple shadows and are unchanged from multiple prior
exams.

Stable scarring, right upper lobe. Atherosclerotic aortic arch.
Heart size within normal limits. No pleural effusion.
IMPRESSION: 1.  No active cardiopulmonary disease is radiographically apparent.
2. Chronic scarring, right upper lobe.
3. Atherosclerosis.

## 2018-01-13 IMAGING — MR MR CERVICAL SPINE W/O CM
5 of 7 series · 18 of 48 positions shown · non-contrast
Comparison: None.

CLINICAL DATA: Left-sided back pain radiating in through the entire
back and into the left leg. Low-grade fever. Initial encounter.

EXAM:
MRI CERVICAL SPINE WITHOUT CONTRAST
TECHNIQUE: Multiplanar, multisequence MR imaging of the cervical spine was
performed. No intravenous contrast was administered.

[Series 2: T2 · sagittal · 3.0mm · 0.35mm/px · 4 of 13 slices shown (1 of 2)]
[im 1/13]
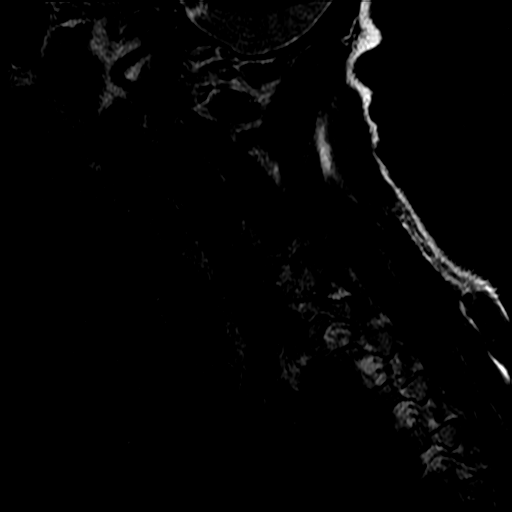
[im 5/13]
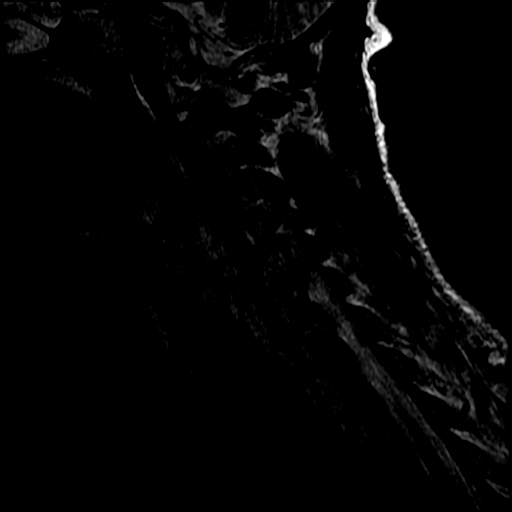
[im 9/13]
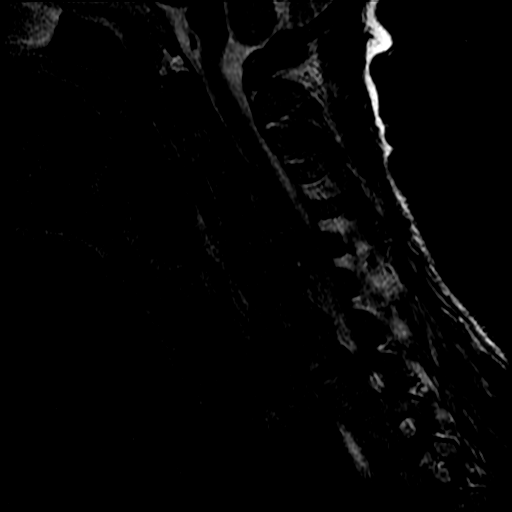
[im 13/13]
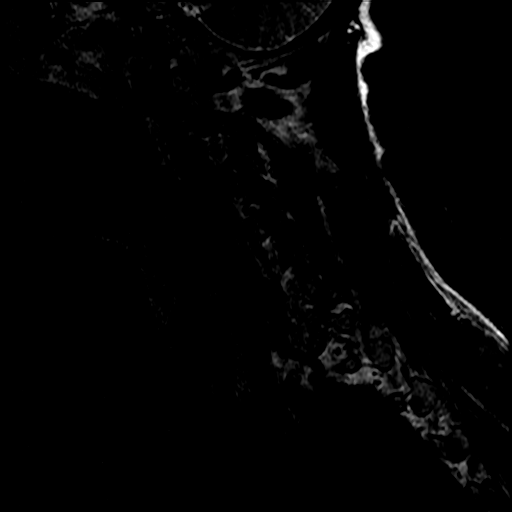

[Series 3: T1 · sagittal · 3.0mm · 0.35mm/px · 4 of 13 slices shown]
[im 1/13]
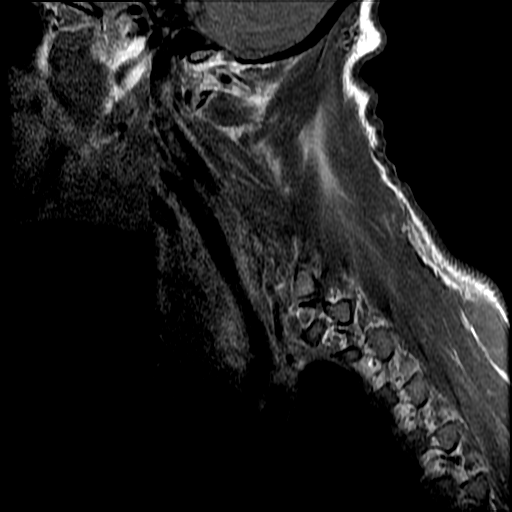
[im 5/13]
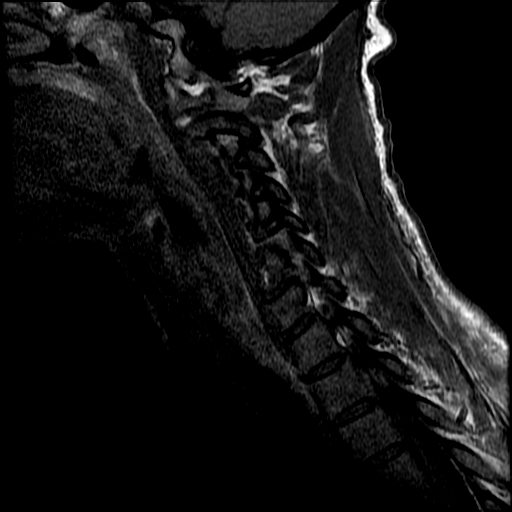
[im 9/13]
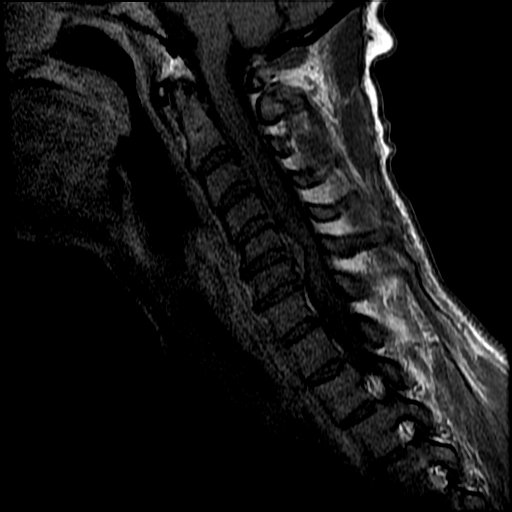
[im 13/13]
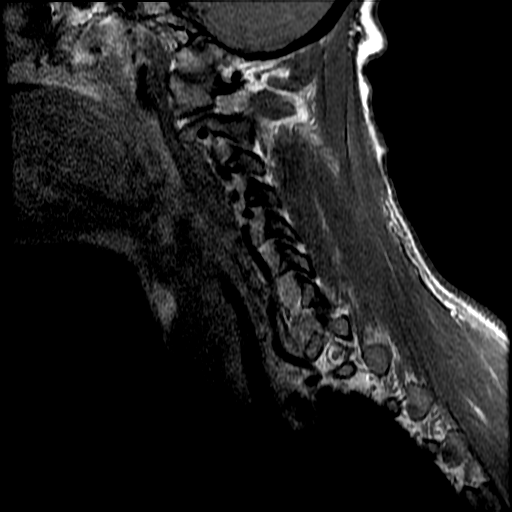

[Series 4: sag ir · sagittal · 3.0mm · 0.35mm/px · 3 of 13 slices shown]
[im 1/13]
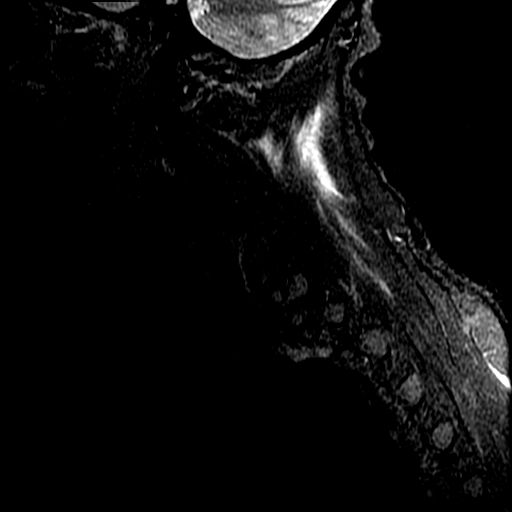
[im 7/13]
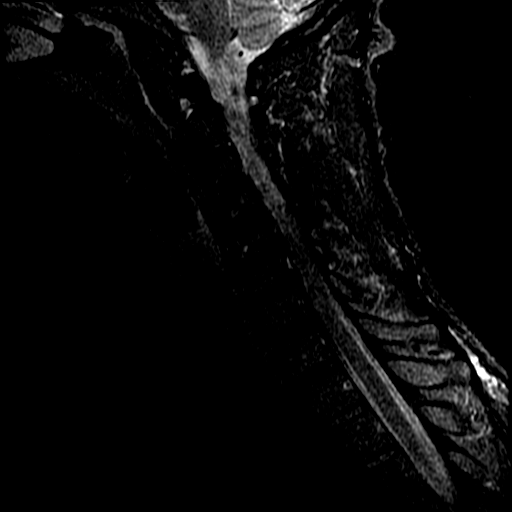
[im 13/13]
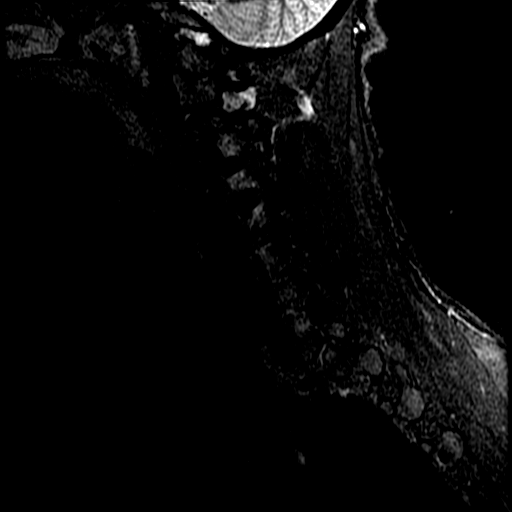

[Series 5: T2 · axial · 3.0mm · 0.39mm/px · z∈[-57,+17]mm · 6 of 24 slices shown (2 of 2)]
[im 1/24]
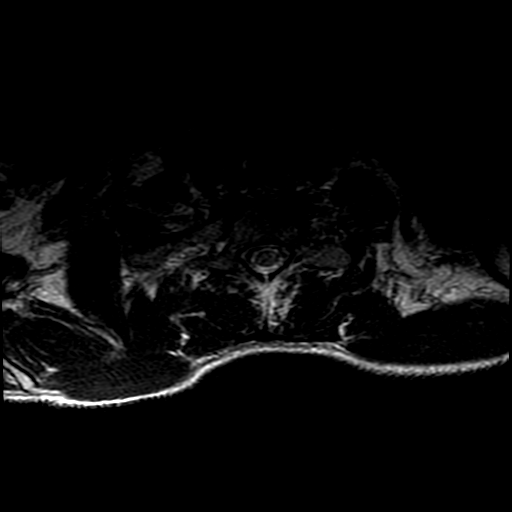
[im 5/24]
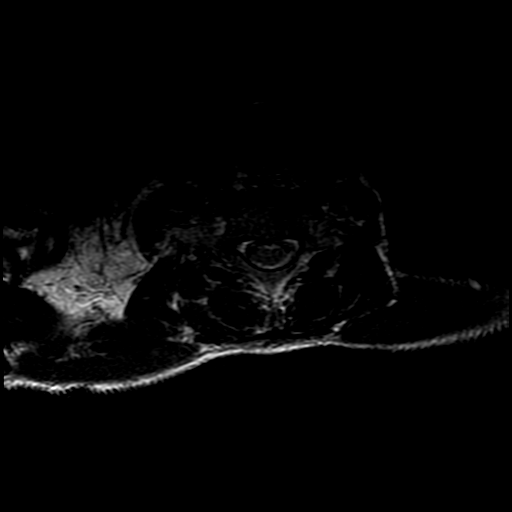
[im 10/24]
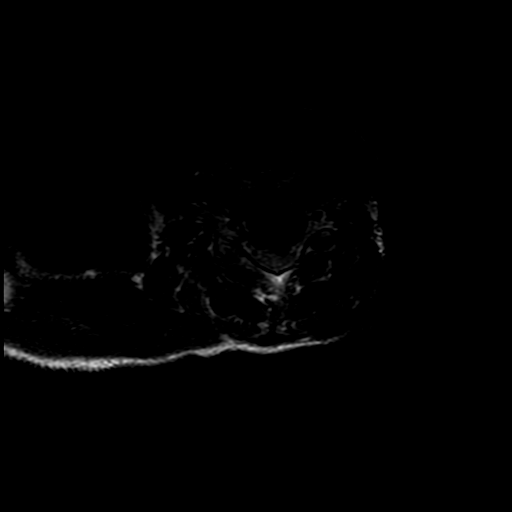
[im 14/24]
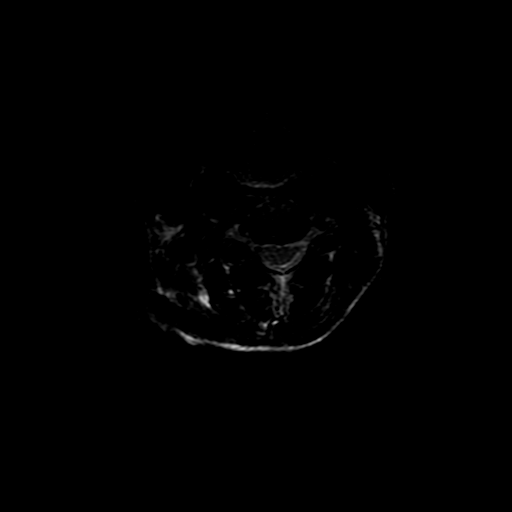
[im 19/24]
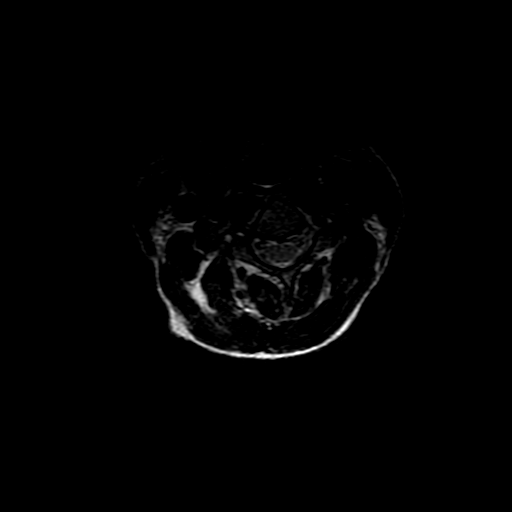
[im 24/24]
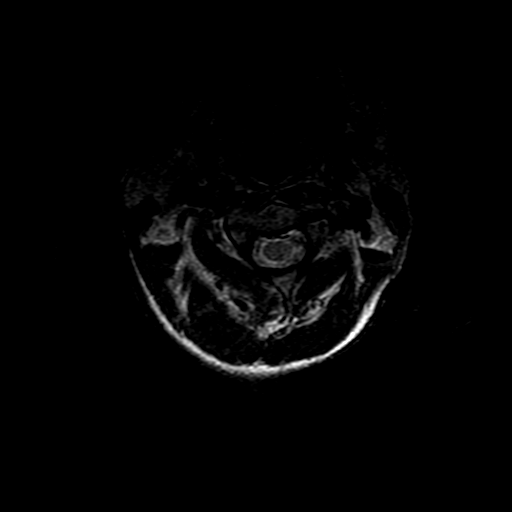

[Series 8: GRE · axial · 3.0mm · 0.39mm/px · 1 of 24 slices shown]
[im 1/24]
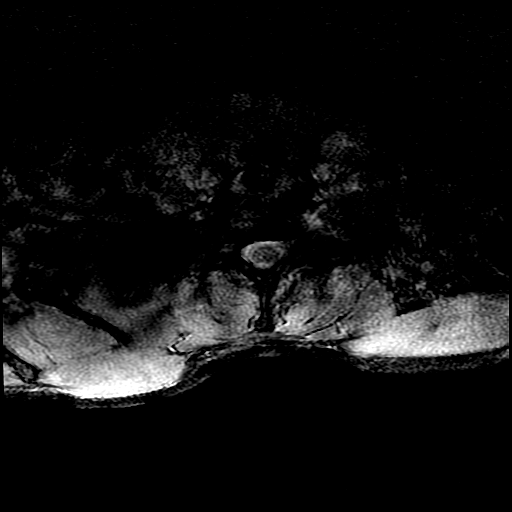

[18 of 48 positions shown; findings below may reference images not displayed]

FINDINGS: There is straightening of the normal cervical lordosis. Convex left
curvature may be positional. Vertebral body height, signal alignment
are maintained. The craniocervical junction is normal. Cervical cord
signal is normal. Fluid is seen interposed between the splenius
capitis and semispinalis capitis muscles. No edema within the
muscles themselves is identified. Paraspinous soft tissue structures
are unremarkable.

C2-3:  Negative.

C3-4: Shallow central protrusion effaces the ventral thecal sac. The
foramina are open.

C4-5: Shallow broad-based disc bulge effaces the ventral thecal sac.
The foramina are open.

C5-6: The patient has a disc bulge with a superimposed large left
paracentral protrusion with caudal extension. The cord is deformed
throughout and the left hemi cord is severely flattened. There is
moderate left foraminal narrowing. The right foramen is open.

C6-7: Shallow disc bulge and uncovertebral disease are seen. There
is mild for the ventral cord. Moderate to moderately severe
bilateral foraminal narrowing is identified.

C7-T1: Negative.
IMPRESSION: Fluid interposed between posterior paraspinous musculature on the
right is nonspecific. It could be secondary to strain, dependent
change or less likely infectious or inflammatory process. No
drainable collection is present. No convincing evidence of myositis
is identified and there is no evidence of discitis or osteomyelitis.

Disc bulge with a superimposed very large left there paracentral
protrusion with caudal extension at C5-6 results in severe
flattening of the left hemi cord.

Disc bulge at C6-7 effaces the ventral thecal sac. Moderate to
moderately severe foraminal narrowing at this level appears somewhat
worse on the left.

Disc bulge at C4-5 effaces the ventral thecal sac.

## 2018-01-13 IMAGING — MR MR THORACIC SPINE WO/W CM
6 of 9 series · 27 of 48 positions shown · IV contrast (Yes   MULTIHANCE)
Comparison: None.

CLINICAL DATA: Left side low back pain radiating throughout the
back and into the left leg and foot with numbness and tingling in
the left leg. Symptoms began this morning. No known injury. Initial
encounter.

EXAM:
MRI THORACIC SPINE WITHOUT AND WITH CONTRAST
TECHNIQUE: Multiplanar and multiecho pulse sequences of the thoracic spine were
obtained without and with intravenous contrast.
CONTRAST:  9 ml MULTIHANCE GADOBENATE DIMEGLUMINE 529 MG/ML IV SOLN

[Series 5: T1 post-contrast · sagittal · 3.0mm · 0.62mm/px · 2 of 14 slices shown (1 of 2)]
[im 1/14]
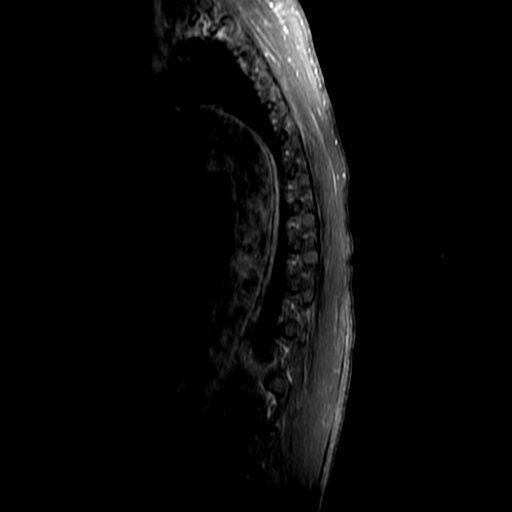
[im 14/14]
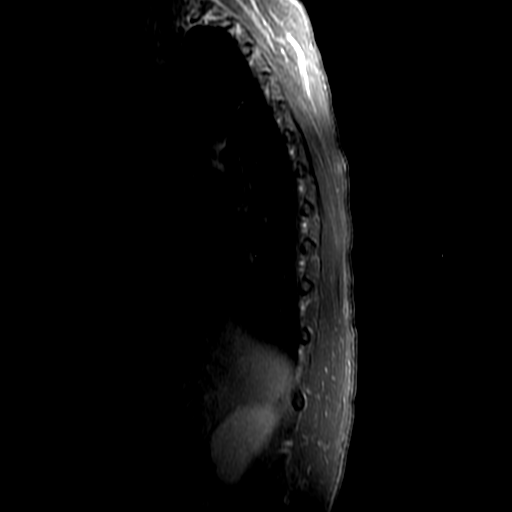

[Series 6: T1 post-contrast · axial · 4.0mm · 0.39mm/px · 1 of 42 slices shown (2 of 2)]
[im 1/42]
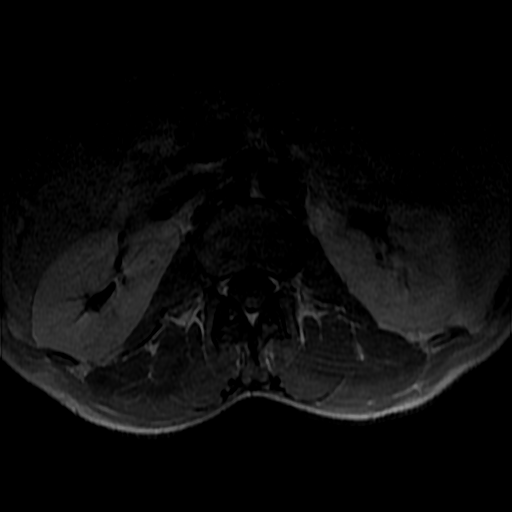

[Series 8: T2 · sagittal · 3.0mm · 1.25mm/px · 3 of 12 slices shown (1 of 2)]
[im 1/12]
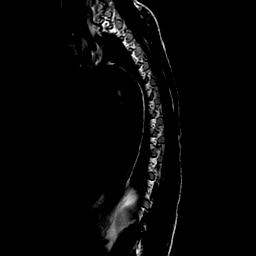
[im 6/12]
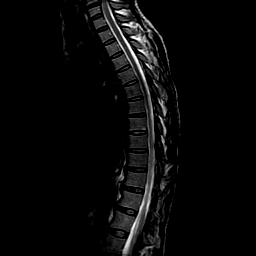
[im 12/12]
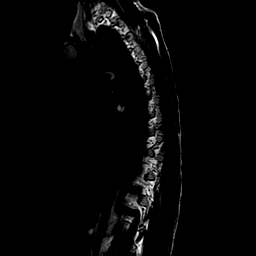

[Series 9: T1 · sagittal · 3.0mm · 0.62mm/px · 3 of 12 slices shown (1 of 2)]
[im 1/12]
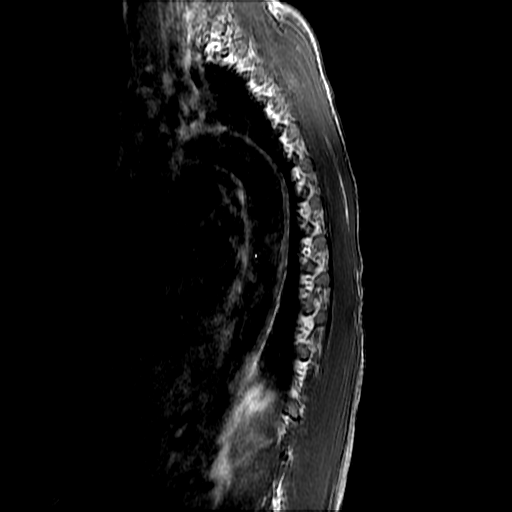
[im 6/12]
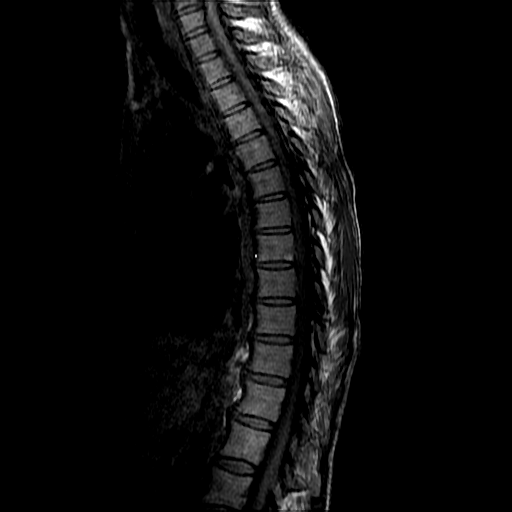
[im 12/12]
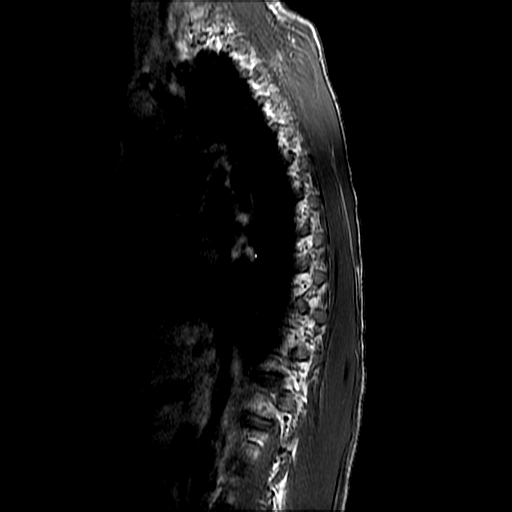

[Series 11: T2 · axial · 4.0mm · 0.86mm/px · z∈[-172,+22]mm · 9 of 42 slices shown (2 of 2)]
[im 1/42]
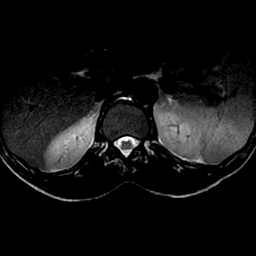
[im 6/42]
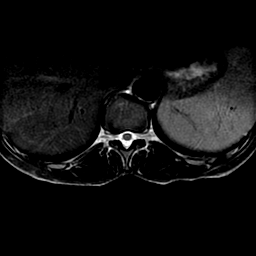
[im 11/42]
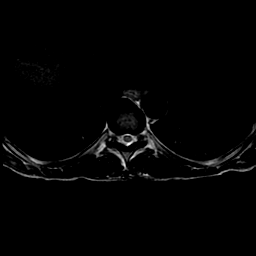
[im 16/42]
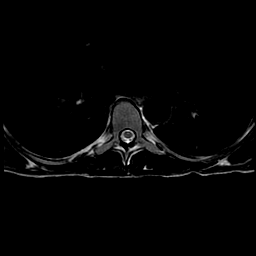
[im 21/42]
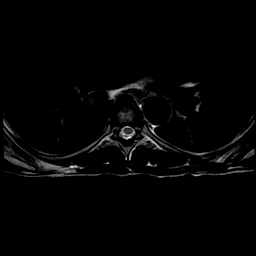
[im 26/42]
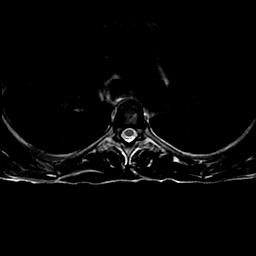
[im 31/42]
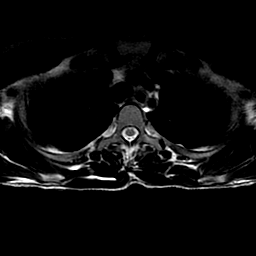
[im 36/42]
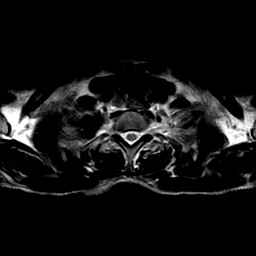
[im 42/42]
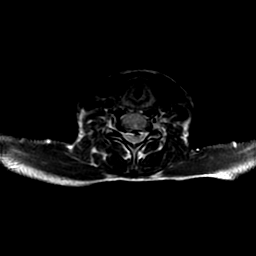

[Series 13: T1 · axial · non-contrast · 4.0mm · 0.43mm/px · z∈[-171,+22]mm · 9 of 42 slices shown (2 of 2)]
[im 1/42]
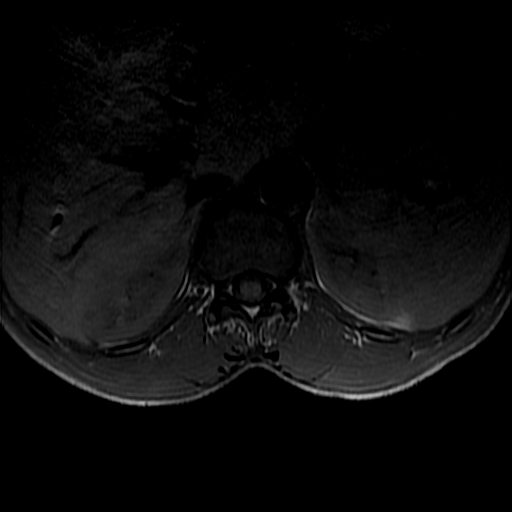
[im 6/42]
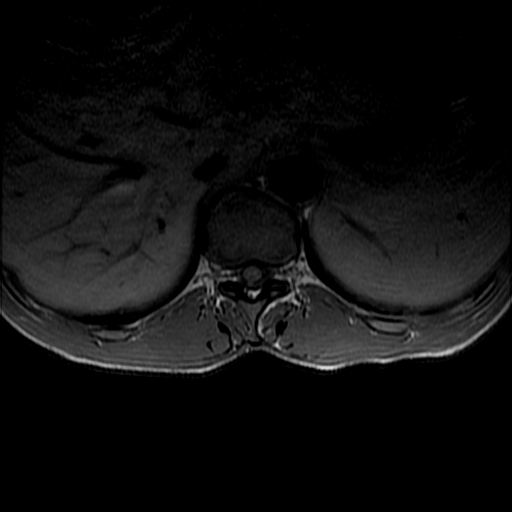
[im 11/42]
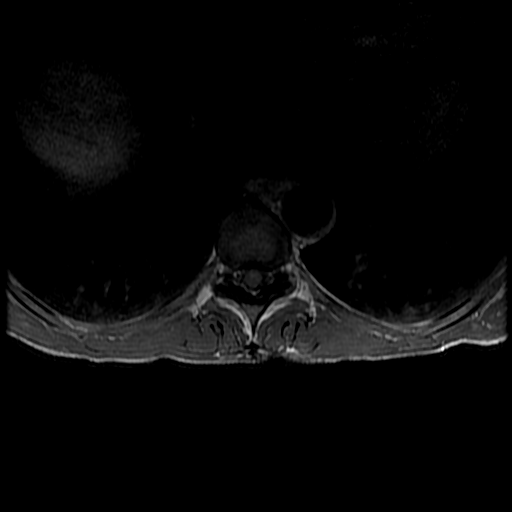
[im 16/42]
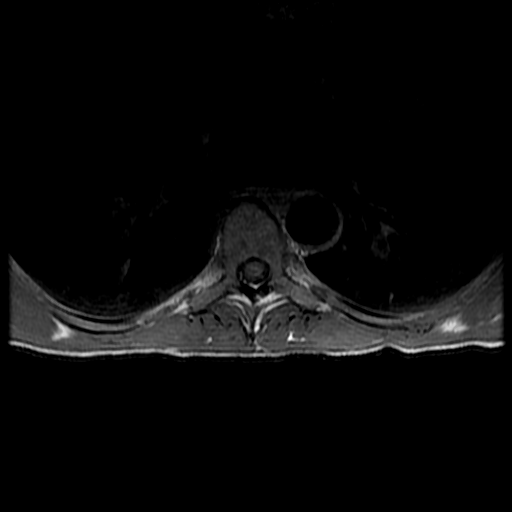
[im 21/42]
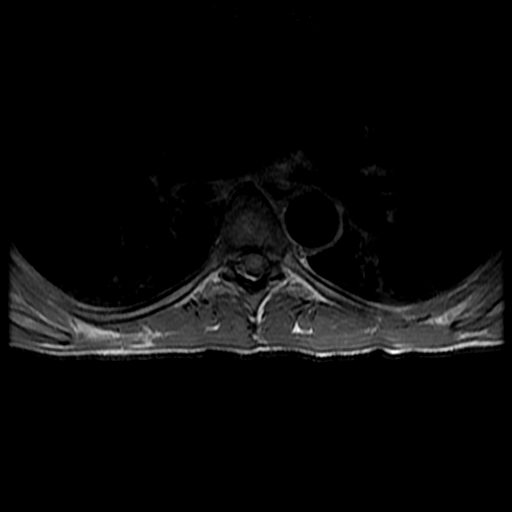
[im 26/42]
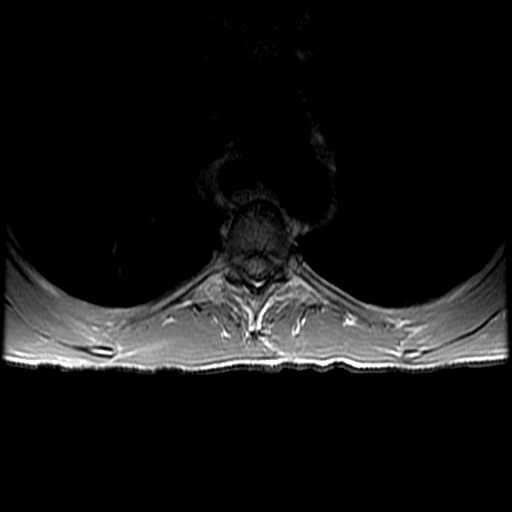
[im 31/42]
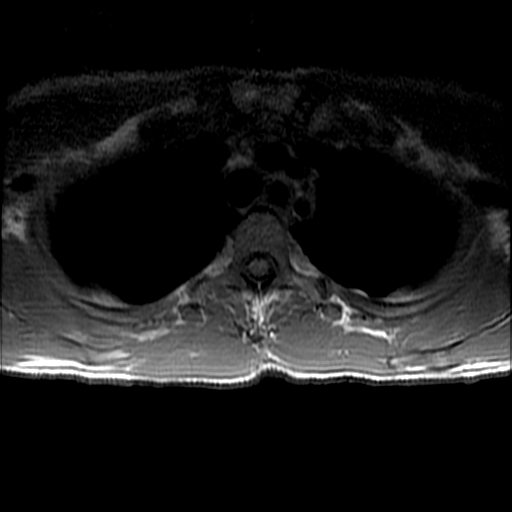
[im 36/42]
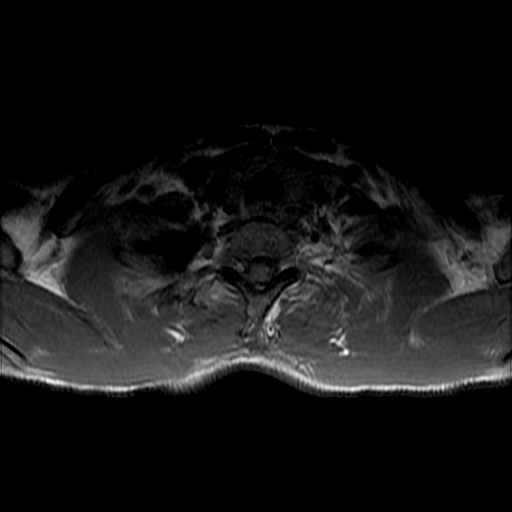
[im 42/42]
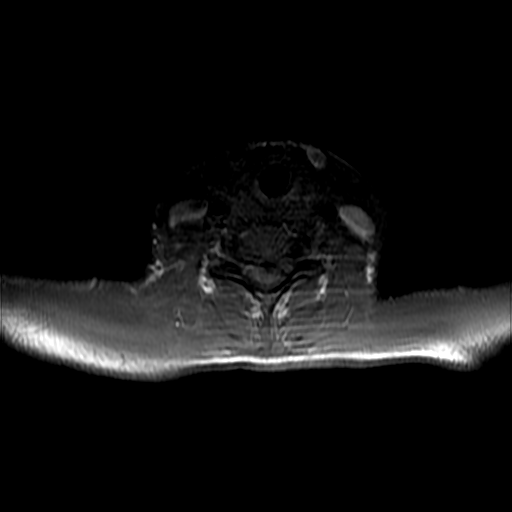

[27 of 48 positions shown; findings below may reference images not displayed]

FINDINGS: [facility] imaging includes a wide field-of-view sagittal T1 weighted
sequence of the cervical spine. The patient has a very large disc
protrusion at C5-6 which appears to be central and to the left.
There is also a disc bulge at C6-7. [facility] imaging is otherwise
unremarkable.

Vertebral body height, signal and alignment are maintained
throughout the thoracic spine. The thoracic cord demonstrates normal
signal throughout. No pathologic enhancement after contrast
administration is identified. The central spinal canal neural
foramina are widely patent at all levels. Intervertebral disc spaces
are unremarkable. Imaged paraspinous structures appear normal.
IMPRESSION: Dominant finding is on [facility] imaging where a very large central and
eccentric to the left disc protrusion at C5-6 is seen but
incompletely evaluated. Cervical spine MRI without contrast could be
used for further evaluation.

Negative thoracic spine.

## 2018-01-20 ENCOUNTER — Emergency Department (HOSPITAL_COMMUNITY): Payer: Medicaid Other

## 2018-01-20 ENCOUNTER — Emergency Department (HOSPITAL_COMMUNITY)
Admission: EM | Admit: 2018-01-20 | Discharge: 2018-01-20 | Disposition: A | Discharge status: Home / Self Care | Payer: Medicaid Other | Attending: Emergency Medicine | Admitting: Emergency Medicine

## 2018-01-20 DIAGNOSIS — F17210 Nicotine dependence, cigarettes, uncomplicated: Secondary | ICD-10-CM | POA: Diagnosis not present

## 2018-01-20 DIAGNOSIS — Z89511 Acquired absence of right leg below knee: Secondary | ICD-10-CM | POA: Diagnosis not present

## 2018-01-20 DIAGNOSIS — Z89512 Acquired absence of left leg below knee: Secondary | ICD-10-CM | POA: Insufficient documentation

## 2018-01-20 DIAGNOSIS — J40 Bronchitis, not specified as acute or chronic: Secondary | ICD-10-CM | POA: Diagnosis not present

## 2018-01-20 DIAGNOSIS — L089 Local infection of the skin and subcutaneous tissue, unspecified: Secondary | ICD-10-CM

## 2018-01-20 DIAGNOSIS — M069 Rheumatoid arthritis, unspecified: Secondary | ICD-10-CM | POA: Insufficient documentation

## 2018-01-20 DIAGNOSIS — G546 Phantom limb syndrome with pain: Secondary | ICD-10-CM

## 2018-01-20 DIAGNOSIS — Z79899 Other long term (current) drug therapy: Secondary | ICD-10-CM | POA: Diagnosis not present

## 2018-01-20 DIAGNOSIS — R05 Cough: Secondary | ICD-10-CM | POA: Diagnosis not present

## 2018-01-20 DIAGNOSIS — I10 Essential (primary) hypertension: Secondary | ICD-10-CM | POA: Diagnosis not present

## 2018-01-20 DIAGNOSIS — R509 Fever, unspecified: Secondary | ICD-10-CM | POA: Diagnosis not present

## 2018-01-20 DIAGNOSIS — T148XXA Other injury of unspecified body region, initial encounter: Secondary | ICD-10-CM

## 2018-01-20 DIAGNOSIS — F1910 Other psychoactive substance abuse, uncomplicated: Secondary | ICD-10-CM

## 2018-01-20 LAB — COMPREHENSIVE METABOLIC PANEL
ALBUMIN: 3.6 g/dL (ref 3.5–5.0)
ALK PHOS: 93 U/L (ref 38–126)
ALT: 16 U/L (ref 14–54)
AST: 30 U/L (ref 15–41)
Anion gap: 13 (ref 5–15)
BILIRUBIN TOTAL: 1.4 mg/dL — AB (ref 0.3–1.2)
BUN: 15 mg/dL (ref 6–20)
CALCIUM: 9.5 mg/dL (ref 8.9–10.3)
CO2: 20 mmol/L — ABNORMAL LOW (ref 22–32)
CREATININE: 0.68 mg/dL (ref 0.44–1.00)
Chloride: 105 mmol/L (ref 101–111)
GFR calc Af Amer: >60 mL/min (ref >60)
GFR calc non Af Amer: >60 mL/min (ref >60)
GLUCOSE: 107 mg/dL — AB (ref 65–99)
Potassium: 3.7 mmol/L (ref 3.5–5.1)
Sodium: 138 mmol/L (ref 135–145)
TOTAL PROTEIN: 8.2 g/dL — AB (ref 6.5–8.1)

## 2018-01-20 LAB — CBC WITH DIFFERENTIAL/PLATELET
BASOS ABS: 0 10*3/uL (ref 0.0–0.1)
BASOS PCT: 0 %
EOS ABS: 0 10*3/uL (ref 0.0–0.7)
EOS PCT: 0 %
HCT: 32.9 % — ABNORMAL LOW (ref 36.0–46.0)
Hemoglobin: 11.3 g/dL — ABNORMAL LOW (ref 12.0–15.0)
Lymphocytes Relative: 9 %
Lymphs Abs: 1.2 10*3/uL (ref 0.7–4.0)
MCH: 28.3 pg (ref 26.0–34.0)
MCHC: 34.3 g/dL (ref 30.0–36.0)
MCV: 82.3 fL (ref 78.0–100.0)
MONO ABS: 0.6 10*3/uL (ref 0.1–1.0)
Monocytes Relative: 4 %
Neutro Abs: 11.6 10*3/uL — ABNORMAL HIGH (ref 1.7–7.7)
Neutrophils Relative %: 87 %
PLATELETS: 275 10*3/uL (ref 150–400)
RBC: 4 MIL/uL (ref 3.87–5.11)
RDW: 15.7 % — AB (ref 11.5–15.5)
WBC: 13.4 10*3/uL — ABNORMAL HIGH (ref 4.0–10.5)

## 2018-01-20 LAB — RAPID URINE DRUG SCREEN, HOSP PERFORMED
Amphetamines: NOT DETECTED
BARBITURATES: NOT DETECTED
BENZODIAZEPINES: NOT DETECTED
COCAINE: POSITIVE — AB
Opiates: POSITIVE — AB
TETRAHYDROCANNABINOL: POSITIVE — AB

## 2018-01-20 LAB — URINALYSIS, ROUTINE W REFLEX MICROSCOPIC
Bacteria, UA: NONE SEEN
Bilirubin Urine: NEGATIVE
Glucose, UA: NEGATIVE mg/dL
Ketones, ur: NEGATIVE mg/dL
LEUKOCYTES UA: NEGATIVE
Nitrite: NEGATIVE
PH: 5 (ref 5.0–8.0)
Protein, ur: >=300 mg/dL — AB
SPECIFIC GRAVITY, URINE: 1.026 (ref 1.005–1.030)

## 2018-01-20 LAB — I-STAT CG4 LACTIC ACID, ED: Lactic Acid, Venous: 1.4 mmol/L (ref 0.5–1.9)

## 2018-01-20 MED ORDER — HYDROCODONE-IBUPROFEN 7.5-200 MG PO TABS: 1.0000 | ORAL_TABLET | Freq: Four times a day (QID) | ORAL | 0 refills | Status: DC | PRN

## 2018-01-20 MED ORDER — SODIUM CHLORIDE 0.9 % IV BOLUS
1000.0000 mL | Freq: Once | INTRAVENOUS | Status: AC
  Administered 2018-01-20: 1000 mL via INTRAVENOUS

## 2018-01-20 MED ORDER — SODIUM CHLORIDE 0.9 % IV SOLN
500.0000 mg | INTRAVENOUS | Status: DC
  Administered 2018-01-20: 500 mg via INTRAVENOUS
  Filled 2018-01-20: qty 500

## 2018-01-20 MED ORDER — SODIUM CHLORIDE 0.9 % IV BOLUS
500.0000 mL | Freq: Once | INTRAVENOUS | Status: AC
  Administered 2018-01-20: 500 mL via INTRAVENOUS

## 2018-01-20 MED ORDER — IBUPROFEN 200 MG PO TABS
600.0000 mg | ORAL_TABLET | Freq: Once | ORAL | Status: AC
  Administered 2018-01-20: 600 mg via ORAL
  Filled 2018-01-20: qty 3

## 2018-01-20 MED ORDER — HYDROMORPHONE HCL 1 MG/ML IJ SOLN
1.0000 mg | Freq: Once | INTRAMUSCULAR | Status: AC
Start: 2018-01-20 — End: 2018-01-20
  Administered 2018-01-20: 1 mg via INTRAVENOUS
  Filled 2018-01-20: qty 1

## 2018-01-20 MED ORDER — SODIUM CHLORIDE 0.9 % IV SOLN
2.0000 g | INTRAVENOUS | Status: DC
  Administered 2018-01-20: 2 g via INTRAVENOUS
  Filled 2018-01-20 (×2): qty 20

## 2018-01-20 MED ORDER — DIPHENHYDRAMINE HCL 50 MG/ML IJ SOLN
12.5000 mg | Freq: Once | INTRAMUSCULAR | Status: AC
  Administered 2018-01-20: 12.5 mg via INTRAVENOUS
  Filled 2018-01-20: qty 1

## 2018-01-20 MED ORDER — DOXYCYCLINE HYCLATE 100 MG PO CAPS: 100.0000 mg | ORAL_CAPSULE | Freq: Two times a day (BID) | ORAL | 0 refills | Status: DC

## 2018-01-20 MED ORDER — VANCOMYCIN HCL IN DEXTROSE 1-5 GM/200ML-% IV SOLN
1000.0000 mg | Freq: Once | INTRAVENOUS | Status: AC
Start: 2018-01-20 — End: 2018-01-20
  Administered 2018-01-20: 1000 mg via INTRAVENOUS
  Filled 2018-01-20: qty 200

## 2018-01-20 MED ORDER — HYDROMORPHONE HCL 1 MG/ML IJ SOLN
1.0000 mg | Freq: Once | INTRAMUSCULAR | Status: AC
  Administered 2018-01-20: 1 mg via INTRAVENOUS
  Filled 2018-01-20: qty 1

## 2018-01-20 NOTE — ED Triage Notes (Signed)
Per EMS, pt is coming from home with complaints of bilateral leg pain. Pt had bilateral BKA surgery 8 months ago and has been performing wound care at home. Pt recently had a fall 2 months ago that opened up her wounds. Pt AO x4. Pt has a hx of HTN, diabetes, and drug abuse.

## 2018-01-20 NOTE — ED Provider Notes (Signed)
[facility] DEPT Provider Note   CSN: [id number] Arrival date & time: 01/20/18  0806     History   Chief Complaint Chief Complaint  Patient presents with  . Wound Infection    HPI [name] is a 57 y.o. female.  Pt presents to the ED today with leg pain, fever, and cough.  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  She had a bilateral leg amputation in July 2018 due to nonhealing wounds.  The pt fell about 2 months ago and opened up the surgical wounds on the stumps.  The pt has been caring for the wounds herself.  She has been to the ED multiple times for phantom limb pain.  The pt did not take anything for her fever today.       Past Medical History:  Diagnosis Date  . CAP (community acquired pneumonia) 03/2014 X 2  . Cocaine abuse ([place])    ongoing with resultant vaculitis.  . Depression   . Headache    "weekly" (07/29/2016)  . Hypertension   . Inflammatory arthritis   . Migraines    "probably 5-6/yr" (07/29/2016)  . Normocytic anemia    BL Hgb 9.8-12. Last anemia panel 04/2010 - showing Fe 19, ferritin 101.  Pt on monthly B12 injections  . Rheumatoid arthritis(714.0)    patient reported  . 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 Followed by Dr. Louanne Skye    Patient Active Problem List   Diagnosis Date Noted  . Ulcer of amputation stump of lower extremity ([place]) 10/23/2017  . 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 Hypertensive crisis   . Phantom limb pain ([place])   . S/P bilateral below knee amputation ([place]) 04/13/2017  . Tobacco abuse   . Post-operative pain   . Acute blood loss anemia   . 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 Wound infection 03/30/2017  . AKI (acute kidney injury) ([place]) 02/07/2017  . Acute kidney injury ([place]) 02/06/2017  . Wound healing, delayed   . Chest pain  04/07/2015  . Protein-calorie malnutrition, severe ([place]) 01/15/2015  . MDD (major depressive disorder), recurrent episode, severe ([place]) 10/16/2014  . Cocaine use disorder, severe, dependence ([place]) 10/10/2014  . 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 Essential hypertension 02/26/2010    Past Surgical History:  Procedure Laterality Date  . AMPUTATION Left 05/22/2016   Procedure: AMPUTATION LEFT LONG FINGER;  Surgeon: Marybelle Killings, MD;  Location: [facility];  Service: Orthopedics;  Laterality: Left;  . AMPUTATION Bilateral 04/10/2017   Procedure: AMPUTATION BELOW KNEE;  Surgeon: Newt Minion, MD;  Location: [facility];  Service: Orthopedics;  Laterality: Bilateral;  . HERNIA REPAIR     "stomach"  . I&D EXTREMITY Right 09/26/2015   Procedure: IRRIGATION AND DEBRIDEMENT LEG WOUND  VAC PLACEMENT.;  Surgeon: Loel Lofty Dillingham, DO;  Location: [facility];  Service: Plastics;  Laterality: Right;  . INCISION AND DRAINAGE OF WOUND Bilateral 10/20/2016   Procedure: IRRIGATION AND DEBRIDEMENT WOUND BILATERAL;  Surgeon: Edrick Kins, DPM;  Location: [facility];  Service: Podiatry;  Laterality: Bilateral;  . IRRIGATION AND DEBRIDEMENT ABSCESS Bilateral 09/26/2013   Procedure: DEBRIDEMENT ULCERS BILATERAL THIGHS;  Surgeon: Gwenyth Ober, MD;  Location: [facility];  Service: General;  Laterality: Bilateral;  . SKIN BIOPSY Bilateral    shin nodules  OB History   None      Home Medications    Prior to Admission medications   Medication Sig Start Date End Date Taking? Authorizing Provider  amLODipine (NORVASC) 10 MG tablet Take 1 tablet (10 mg total) by mouth daily. Patient not taking: Reported on 10/09/2017 09/14/17   Frederica Kuster, PA-C  busPIRone (BUSPAR) 15 MG tablet Take 1 tablet (15 mg total) by mouth 3 (three) times daily. Patient not taking: Reported on 08/09/2017 07/26/17   Scot Jun, FNP  cyclobenzaprine (FLEXERIL) 5 MG tablet Take 1 tablet (5 mg total) by mouth 3 (three) times daily  as needed (muscle soreness). Patient not taking: Reported on 08/09/2017 07/04/17   Rolland Porter, MD  doxycycline (VIBRAMYCIN) 100 MG capsule Take 1 capsule (100 mg total) by mouth 2 (two) times daily. 01/20/18   Isla Pence, MD  escitalopram (LEXAPRO) 10 MG tablet Take 2 tablets (20 mg total) by mouth at bedtime. Patient not taking: Reported on 08/09/2017 07/26/17   Scot Jun, FNP  gabapentin (NEURONTIN) 600 MG tablet Take 1 tablet (600 mg total) by mouth 3 (three) times daily. Patient not taking: Reported on 08/09/2017 07/26/17   Scot Jun, FNP  hydrochlorothiazide (HYDRODIURIL) 25 MG tablet Take 1 tablet (25 mg total) daily by mouth. Patient not taking: Reported on 09/14/2017 08/11/17   Dorie Rank, MD  HYDROcodone-ibuprofen (VICOPROFEN) 7.5-200 MG tablet Take 1 tablet by mouth every 6 (six) hours as needed for moderate pain. 01/20/18   Isla Pence, MD  potassium chloride (K-DUR) 10 MEQ tablet Take 1 tablet (10 mEq total) daily by mouth. Patient not taking: Reported on 09/14/2017 08/11/17   Dorie Rank, MD    Family History Family History  Problem Relation Age of Onset  . Breast cancer Mother        Breast cancer  . Alcohol abuse Mother   . Colon cancer Maternal Aunt 42  . Alcohol abuse Father     Social History Social History   Tobacco Use  . Smoking status: Current Every Day Smoker    Packs/day: 0.12    Years: 38.00    Pack years: 4.56    Types: Cigarettes  . Smokeless tobacco: Never Used  . Tobacco comment: 2 A DAY  Substance Use Topics  . Alcohol use: No    Alcohol/week: 0.0 oz  . Drug use: Yes    Types: "Crack" cocaine, Cocaine    Comment: Smoked crack 06/02/2017     Allergies   Acetaminophen and Lisinopril   Review of Systems Review of Systems  Constitutional: Positive for fever.  Respiratory: Positive for cough.   Musculoskeletal:       Bilateral LE pain  All other systems reviewed and are negative.    Physical Exam Updated Vital  Signs BP 130/80   Pulse 90   Temp 99.3 F (37.4 C) (Rectal)   Resp 16   Wt 45.4 kg (100 lb)   SpO2 90%   BMI 18.89 kg/m   Physical Exam  Constitutional: She is oriented to person, place, and time. She appears distressed.  HENT:  Right Ear: External ear normal.  Left Ear: External ear normal.  Nose: Nose normal.  Mouth/Throat: Oropharynx is clear and moist.  Ears with evidence of vasculitis  Eyes: Pupils are equal, round, and reactive to light. Conjunctivae and EOM are normal.  Neck: Normal range of motion. Neck supple.  Cardiovascular: Normal heart sounds and intact distal pulses. Tachycardia present.  Pulmonary/Chest: Effort normal and  breath sounds normal.  Abdominal: Soft. Bowel sounds are normal.  Musculoskeletal:  Bilateral wound scabs to each stump.  No drainage.  Neurological: She is alert and oriented to person, place, and time.  Skin: Skin is warm and dry. Capillary refill takes less than 2 seconds.  Psychiatric: Thought content normal. Her mood appears anxious. She is agitated. Cognition and memory are normal.  Nursing note and vitals reviewed.    ED Treatments / Results  Labs (all labs ordered are listed, but only abnormal results are displayed) Labs Reviewed  COMPREHENSIVE METABOLIC PANEL - Abnormal; Notable for the following components:      Result Value   CO2 20 (*)    Glucose, Bld 107 (*)    Total Protein 8.2 (*)    Total Bilirubin 1.4 (*)    All other components within normal limits  CBC WITH DIFFERENTIAL/PLATELET - Abnormal; Notable for the following components:   WBC 13.4 (*)    Hemoglobin 11.3 (*)    HCT 32.9 (*)    RDW 15.7 (*)    Neutro Abs 11.6 (*)    All other components within normal limits  URINALYSIS, ROUTINE W REFLEX MICROSCOPIC - Abnormal; Notable for the following components:   Color, Urine AMBER (*)    APPearance HAZY (*)    Hgb urine dipstick LARGE (*)    Protein, ur >=300 (*)    Squamous Epithelial / LPF 0-5 (*)    All other  components within normal limits  RAPID URINE DRUG SCREEN, HOSP PERFORMED - Abnormal; Notable for the following components:   Opiates POSITIVE (*)    Cocaine POSITIVE (*)    Tetrahydrocannabinol POSITIVE (*)    All other components within normal limits  CULTURE, BLOOD (ROUTINE X 2)  CULTURE, BLOOD (ROUTINE X 2)  I-STAT CG4 LACTIC ACID, ED    EKG EKG Interpretation  Date/Time:  Thursday January 20 2018 08:09:20 EDT Ventricular Rate:  119 PR Interval:    QRS Duration: 83 QT Interval:  328 QTC Calculation: 462 R Axis:   84 Text Interpretation:  Sinus tachycardia Biatrial enlargement LVH with secondary repolarization abnormality ST depr, consider ischemia, inferior leads No significant change since last tracing Confirmed by Isla Pence [phone]) on 01/20/2018 8:53:39 AM   Radiology Dg Chest 2 View  Result Date: 01/20/2018 CLINICAL DATA:  Shortness of breath with cough and congestion EXAM: CHEST - 2 VIEW COMPARISON:  October 23, 2017 FINDINGS: There is scarring in the right upper lobe, stable. There is no edema or consolidation. Heart size and pulmonary vascularity are normal. No adenopathy. There is aortic atherosclerosis. No evident bone lesions. IMPRESSION: Mild scarring right upper lobe, stable. No edema or consolidation. Stable cardiac silhouette. There is aortic atherosclerosis. Aortic Atherosclerosis (ICD10-I70.0). Electronically Signed   By: Lowella Grip III M.D.   On: 01/20/2018 09:51   Dg Knee Complete 4 Views Left  Result Date: 01/20/2018 CLINICAL DATA:  Nonhealing wound at the left leg stump. EXAM: LEFT KNEE - COMPLETE 4+ VIEW COMPARISON:  12/17/2017 FINDINGS: Patient has a below the knee amputation. Stable appearance of the remaining tibia and fibula. There is no evidence for cortical irregularity or periosteal reaction at the amputation site. There is lucency and irregularity in the soft tissues at the stump. Left knee is located. IMPRESSION: No acute bone abnormality  involving the amputation site. No radiographic findings for osteomyelitis. Irregularity involving the soft tissues compatible with the clinical history. Electronically Signed   By: Markus Daft M.D.   On:  01/20/2018 09:54   Dg Knee Complete 4 Views Right  Result Date: 01/20/2018 CLINICAL DATA:  Nonhealing wound at left stump EXAM: RIGHT KNEE - COMPLETE 4+ VIEW COMPARISON:  12/17/2017 FINDINGS: Prior below the knee amputation on the right. No bony changes to suggest osteomyelitis. No soft tissue gas. No acute bony abnormality. IMPRESSION: Right BKA.  No radiographic changes of osteomyelitis. Electronically Signed   By: Rolm Baptise M.D.   On: 01/20/2018 09:50    Procedures Procedures (including critical care time)  Medications Ordered in ED Medications  cefTRIAXone (ROCEPHIN) 2 g in sodium chloride 0.9 % 100 mL IVPB (0 g Intravenous Stopped 01/20/18 0945)  azithromycin (ZITHROMAX) 500 mg in sodium chloride 0.9 % 250 mL IVPB (0 mg Intravenous Stopped 01/20/18 1140)  vancomycin (VANCOCIN) IVPB 1000 mg/200 mL premix (1,000 mg Intravenous New Bag/Given 01/20/18 1140)  HYDROmorphone (DILAUDID) injection 1 mg (1 mg Intravenous Given 01/20/18 0836)  sodium chloride 0.9 % bolus 1,000 mL (0 mLs Intravenous Stopped 01/20/18 1058)  ibuprofen (ADVIL,MOTRIN) tablet 600 mg (600 mg Oral Given 01/20/18 0849)  sodium chloride 0.9 % bolus 500 mL (0 mLs Intravenous Stopped 01/20/18 1039)  HYDROmorphone (DILAUDID) injection 1 mg (1 mg Intravenous Given 01/20/18 0907)  diphenhydrAMINE (BENADRYL) injection 12.5 mg (12.5 mg Intravenous Given 01/20/18 0958)     Initial Impression / Assessment and Plan / ED Course  I have reviewed the triage vital signs and the nursing notes.  Pertinent labs & imaging results that were available during my care of the patient were reviewed by me and considered in my medical decision making (see chart for details).    Pt is feeling much better after pain relief.  Vital signs have resolved  after IVFs.  She is encouraged to avoid cocaine.  No cellulitis around stump wounds, and oxygen saturation are ok.  Pt does not appear septic upon further evaluation and is stable for d/c.  Return if worse.  Final Clinical Impressions(s) / ED Diagnoses   Final diagnoses:  Phantom limb pain ([place])  Bronchitis  Wound infection  Polysubstance abuse ([place])  Fever, unspecified fever cause    ED Discharge Orders        Ordered    HYDROcodone-ibuprofen (VICOPROFEN) 7.5-200 MG tablet  Every 6 hours PRN     01/20/18 1229    doxycycline (VIBRAMYCIN) 100 MG capsule  2 times daily     01/20/18 1229       Isla Pence, MD 01/20/18 1233

## 2018-01-20 NOTE — Discharge Instructions (Signed)
Stop using cocaine.

## 2018-01-23 ENCOUNTER — Emergency Department (HOSPITAL_COMMUNITY)
Admission: EM | Admit: 2018-01-23 | Discharge: 2018-01-23 | Disposition: A | Discharge status: Home / Self Care | Payer: Medicaid Other | Attending: Emergency Medicine | Admitting: Emergency Medicine

## 2018-01-23 ENCOUNTER — Encounter (HOSPITAL_COMMUNITY): Payer: Self-pay

## 2018-01-23 ENCOUNTER — Other Ambulatory Visit: Payer: Self-pay

## 2018-01-23 DIAGNOSIS — I10 Essential (primary) hypertension: Secondary | ICD-10-CM | POA: Insufficient documentation

## 2018-01-23 DIAGNOSIS — T8789 Other complications of amputation stump: Secondary | ICD-10-CM | POA: Insufficient documentation

## 2018-01-23 DIAGNOSIS — M79605 Pain in left leg: Secondary | ICD-10-CM

## 2018-01-23 DIAGNOSIS — Z79899 Other long term (current) drug therapy: Secondary | ICD-10-CM | POA: Diagnosis not present

## 2018-01-23 DIAGNOSIS — M79604 Pain in right leg: Secondary | ICD-10-CM

## 2018-01-23 DIAGNOSIS — Y829 Unspecified medical devices associated with adverse incidents: Secondary | ICD-10-CM | POA: Insufficient documentation

## 2018-01-23 DIAGNOSIS — G8929 Other chronic pain: Secondary | ICD-10-CM | POA: Insufficient documentation

## 2018-01-23 DIAGNOSIS — F17210 Nicotine dependence, cigarettes, uncomplicated: Secondary | ICD-10-CM | POA: Diagnosis not present

## 2018-01-23 LAB — BASIC METABOLIC PANEL
Anion gap: 13 (ref 5–15)
BUN: 13 mg/dL (ref 6–20)
CALCIUM: 9 mg/dL (ref 8.9–10.3)
CHLORIDE: 108 mmol/L (ref 101–111)
CO2: 21 mmol/L — ABNORMAL LOW (ref 22–32)
Creatinine, Ser: 0.82 mg/dL (ref 0.44–1.00)
GFR calc non Af Amer: >60 mL/min (ref >60)
GLUCOSE: 97 mg/dL (ref 65–99)
POTASSIUM: 3.4 mmol/L — AB (ref 3.5–5.1)
Sodium: 142 mmol/L (ref 135–145)

## 2018-01-23 LAB — CBC WITH DIFFERENTIAL/PLATELET
BASOS ABS: 0 10*3/uL (ref 0.0–0.1)
BASOS PCT: 0 %
Eosinophils Absolute: 0.1 10*3/uL (ref 0.0–0.7)
Eosinophils Relative: 1 %
HEMATOCRIT: 29 % — AB (ref 36.0–46.0)
HEMOGLOBIN: 10 g/dL — AB (ref 12.0–15.0)
LYMPHS PCT: 22 %
Lymphs Abs: 1.6 10*3/uL (ref 0.7–4.0)
MCH: 28.7 pg (ref 26.0–34.0)
MCHC: 34.5 g/dL (ref 30.0–36.0)
MCV: 83.1 fL (ref 78.0–100.0)
MONO ABS: 0.4 10*3/uL (ref 0.1–1.0)
Monocytes Relative: 5 %
NEUTROS ABS: 5.4 10*3/uL (ref 1.7–7.7)
NEUTROS PCT: 72 %
Platelets: 305 10*3/uL (ref 150–400)
RBC: 3.49 MIL/uL — AB (ref 3.87–5.11)
RDW: 16 % — AB (ref 11.5–15.5)
WBC: 7.4 10*3/uL (ref 4.0–10.5)

## 2018-01-23 LAB — I-STAT CG4 LACTIC ACID, ED: Lactic Acid, Venous: 1.15 mmol/L (ref 0.5–1.9)

## 2018-01-23 MED ORDER — DOXYCYCLINE HYCLATE 100 MG PO CAPS: 100.0000 mg | ORAL_CAPSULE | Freq: Two times a day (BID) | ORAL | 0 refills | Status: DC

## 2018-01-23 MED ORDER — IBUPROFEN 200 MG PO TABS
600.0000 mg | ORAL_TABLET | Freq: Once | ORAL | Status: AC
  Administered 2018-01-23: 600 mg via ORAL
  Filled 2018-01-23: qty 3

## 2018-01-23 MED ORDER — HYDROCODONE-IBUPROFEN 7.5-200 MG PO TABS: 1.0000 | ORAL_TABLET | Freq: Four times a day (QID) | ORAL | 0 refills | Status: DC | PRN

## 2018-01-23 MED ORDER — HYDROMORPHONE HCL 1 MG/ML IJ SOLN: 1.0000 mg | Freq: Once | INTRAMUSCULAR | Status: DC

## 2018-01-23 MED ORDER — DOXYCYCLINE HYCLATE 100 MG PO TABS
100.0000 mg | ORAL_TABLET | Freq: Once | ORAL | Status: AC
  Administered 2018-01-23: 100 mg via ORAL
  Filled 2018-01-23: qty 1

## 2018-01-23 MED ORDER — HYDROMORPHONE HCL 1 MG/ML IJ SOLN
1.0000 mg | Freq: Once | INTRAMUSCULAR | Status: AC
  Administered 2018-01-23: 1 mg via INTRAMUSCULAR
  Filled 2018-01-23: qty 1

## 2018-01-23 MED ORDER — AMLODIPINE BESYLATE 5 MG PO TABS
5.0000 mg | ORAL_TABLET | Freq: Once | ORAL | Status: AC
Start: 2018-01-23 — End: 2018-01-23
  Administered 2018-01-23: 5 mg via ORAL
  Filled 2018-01-23: qty 1

## 2018-01-23 MED ORDER — HYDROCHLOROTHIAZIDE 12.5 MG PO CAPS
25.0000 mg | ORAL_CAPSULE | Freq: Once | ORAL | Status: AC
  Administered 2018-01-23: 25 mg via ORAL
  Filled 2018-01-23: qty 2

## 2018-01-23 NOTE — ED Triage Notes (Signed)
States since yesterday bil stump pain voiced no other complaints.

## 2018-01-23 NOTE — ED Notes (Signed)
Charge RN has called [facility]

## 2018-01-23 NOTE — ED Provider Notes (Signed)
[facility] DEPT Provider Note   CSN: [id number] Arrival date & time: 01/23/18  2423     History   Chief Complaint Chief Complaint  Patient presents with  . Stump pain and bleeding    HPI [name] is a 57 y.o. female who presents with bilateral stump pain. PMH significant for polysubstance abuse (cocaine, marijuana), vasculitis s/p bilateral BKAs by Dr. Sharol Given, chronic stump pain. She states that she was seen three days ago in the ED. She states she was given a prescription for Tramadol instead of Doxycycline. She was also given a prescription for Vicoprofen which she did not fill. She reports ongoing fevers. She does endorse chronic limb pain but states that this is worse than usual. She has had redness and drainage from bilateral stumps. She states one is not worse than the other, they are both very painful. She has not had any vomiting. She has had ongoing crack cocaine use.  HPI  Past Medical History:  Diagnosis Date  . CAP (community acquired pneumonia) 03/2014 X 2  . Cocaine abuse ([place])    ongoing with resultant vaculitis.  . Depression   . Headache    "weekly" (07/29/2016)  . Hypertension   . Inflammatory arthritis   . Migraines    "probably 5-6/yr" (07/29/2016)  . Normocytic anemia    BL Hgb 9.8-12. Last anemia panel 04/2010 - showing Fe 19, ferritin 101.  Pt on monthly B12 injections  . Rheumatoid arthritis(714.0)    patient reported  . 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 Followed by Dr. Louanne Skye    Patient Active Problem List   Diagnosis Date Noted  . Ulcer of amputation stump of lower extremity ([place]) 10/23/2017  . 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 Hypertensive crisis   . Phantom limb pain ([place])   . S/P bilateral below knee amputation ([place]) 04/13/2017  . Tobacco abuse   . Post-operative pain   . Acute blood loss anemia   .  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 Wound infection 03/30/2017  . AKI (acute kidney injury) ([place]) 02/07/2017  . Acute kidney injury ([place]) 02/06/2017  . Wound healing, delayed   . Chest pain 04/07/2015  . Protein-calorie malnutrition, severe ([place]) 01/15/2015  . MDD (major depressive disorder), recurrent episode, severe ([place]) 10/16/2014  . Cocaine use disorder, severe, dependence ([place]) 10/10/2014  . 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 Essential hypertension 02/26/2010    Past Surgical History:  Procedure Laterality Date  . AMPUTATION Left 05/22/2016   Procedure: AMPUTATION LEFT LONG FINGER;  Surgeon: Marybelle Killings, MD;  Location: [facility];  Service: Orthopedics;  Laterality: Left;  . AMPUTATION Bilateral 04/10/2017   Procedure: AMPUTATION BELOW KNEE;  Surgeon: Newt Minion, MD;  Location: [facility];  Service: Orthopedics;  Laterality: Bilateral;  . HERNIA REPAIR     "stomach"  . I&D EXTREMITY Right 09/26/2015   Procedure: IRRIGATION AND DEBRIDEMENT LEG WOUND  VAC PLACEMENT.;  Surgeon: Loel Lofty Dillingham, DO;  Location: [facility];  Service: Plastics;  Laterality: Right;  . INCISION AND DRAINAGE OF WOUND Bilateral 10/20/2016   Procedure: IRRIGATION AND DEBRIDEMENT WOUND BILATERAL;  Surgeon: Edrick Kins, DPM;  Location: [facility];  Service: Podiatry;  Laterality: Bilateral;  . IRRIGATION AND DEBRIDEMENT ABSCESS Bilateral 09/26/2013   Procedure: DEBRIDEMENT ULCERS BILATERAL THIGHS;  Surgeon: Gwenyth Ober, MD;  [facility];  Service: General;  Laterality: Bilateral;  . SKIN BIOPSY Bilateral    shin nodules     OB History   None      Home Medications    Prior to Admission medications   Medication Sig Start Date End Date Taking? Authorizing Provider  amLODipine (NORVASC) 10 MG tablet Take 1 tablet (10 mg total) by mouth daily. Patient not taking: Reported on 10/09/2017 09/14/17   Frederica Kuster, PA-C  busPIRone (BUSPAR) 15 MG  tablet Take 1 tablet (15 mg total) by mouth 3 (three) times daily. Patient not taking: Reported on 08/09/2017 07/26/17   Scot Jun, FNP  cyclobenzaprine (FLEXERIL) 5 MG tablet Take 1 tablet (5 mg total) by mouth 3 (three) times daily as needed (muscle soreness). Patient not taking: Reported on 08/09/2017 07/04/17   Rolland Porter, MD  doxycycline (VIBRAMYCIN) 100 MG capsule Take 1 capsule (100 mg total) by mouth 2 (two) times daily. 01/20/18   Isla Pence, MD  escitalopram (LEXAPRO) 10 MG tablet Take 2 tablets (20 mg total) by mouth at bedtime. Patient not taking: Reported on 08/09/2017 07/26/17   Scot Jun, FNP  gabapentin (NEURONTIN) 600 MG tablet Take 1 tablet (600 mg total) by mouth 3 (three) times daily. Patient not taking: Reported on 08/09/2017 07/26/17   Scot Jun, FNP  hydrochlorothiazide (HYDRODIURIL) 25 MG tablet Take 1 tablet (25 mg total) daily by mouth. Patient not taking: Reported on 09/14/2017 08/11/17   Dorie Rank, MD  HYDROcodone-ibuprofen (VICOPROFEN) 7.5-200 MG tablet Take 1 tablet by mouth every 6 (six) hours as needed for moderate pain. 01/20/18   Isla Pence, MD  potassium chloride (K-DUR) 10 MEQ tablet Take 1 tablet (10 mEq total) daily by mouth. Patient not taking: Reported on 09/14/2017 08/11/17   Dorie Rank, MD    Family History Family History  Problem Relation Age of Onset  . Breast cancer Mother        Breast cancer  . Alcohol abuse Mother   . Colon cancer Maternal Aunt 57  . Alcohol abuse Father     Social History Social History   Tobacco Use  . Smoking status: Current Every Day Smoker    Packs/day: 0.12    Years: 38.00    Pack years: 4.56    Types: Cigarettes  . Smokeless tobacco: Never Used  . Tobacco comment: 2 A DAY  Substance Use Topics  . Alcohol use: No    Alcohol/week: 0.0 oz  . Drug use: Yes    Types: "Crack" cocaine, Cocaine    Comment: Smoked crack 06/02/2017     Allergies   Acetaminophen and  Lisinopril   Review of Systems Review of Systems  Constitutional: Positive for fever.  Respiratory: Negative for shortness of breath.   Cardiovascular: Negative for chest pain.  Gastrointestinal: Negative for abdominal pain and vomiting.  Musculoskeletal: Positive for arthralgias.  Skin: Positive for wound.  All other systems reviewed and are negative.    Physical Exam Updated Vital Signs BP (!) 169/125 (BP Location: Right Arm) Comment: RN NOTIFIED  Pulse (!) 104 Comment: RN NOTIFIED  Temp 97.7 F (36.5 C) (Oral)   Resp (!) 24 Comment: RN NOTIFIED  Ht 4' 10"  (1.473 m)   Wt 44.5 kg (98 lb)   SpO2 100%   BMI 20.48 kg/m   Physical Exam  Constitutional: She is oriented to person, place, and time. She appears distressed (crying and rolling back and forth).  Chronically ill appearing  HENT:  Head: Normocephalic and atraumatic.  Eyes: Pupils are equal, round, and reactive to light. Conjunctivae are normal. Right eye exhibits no discharge. Left eye exhibits no discharge. No scleral icterus.  Neck: Normal range of motion.  Cardiovascular: Tachycardia present.  Pulmonary/Chest: Effort normal. No respiratory distress.  Abdominal: She exhibits no distension.  Musculoskeletal:  Bilateral BKAs. See pictures for details  Neurological: She is alert and oriented to person, place, and time.  Skin: Skin is warm and dry.  Psychiatric: She has a normal mood and affect. Her behavior is normal.  Nursing note and vitals reviewed.  Left knee   Left stump   Right knee   Right stump     ED Treatments / Results  Labs (all labs ordered are listed, but only abnormal results are displayed) Labs Reviewed  CBC WITH DIFFERENTIAL/PLATELET - Abnormal; Notable for the following components:      Result Value   RBC 3.49 (*)    Hemoglobin 10.0 (*)    HCT 29.0 (*)    RDW 16.0 (*)    All other components within normal limits  BASIC METABOLIC PANEL - Abnormal; Notable for the following  components:   Potassium 3.4 (*)    CO2 21 (*)    All other components within normal limits  CULTURE, BLOOD (ROUTINE X 2)  CULTURE, BLOOD (ROUTINE X 2)  I-STAT CG4 LACTIC ACID, ED    EKG None  Radiology No results found.  Procedures Procedures (including critical care time)  Medications Ordered in ED Medications  ibuprofen (ADVIL,MOTRIN) tablet 600 mg (600 mg Oral Given 01/23/18 0645)  doxycycline (VIBRA-TABS) tablet 100 mg (100 mg Oral Given 01/23/18 0645)  HYDROmorphone (DILAUDID) injection 1 mg (1 mg Intramuscular Given 01/23/18 0645)  amLODipine (NORVASC) tablet 5 mg (5 mg Oral Given 01/23/18 4193)  hydrochlorothiazide (MICROZIDE) capsule 25 mg (25 mg Oral Given 01/23/18 7902)     Initial Impression / Assessment and Plan / ED Course  I have reviewed the triage vital signs and the nursing notes.  Pertinent labs & imaging results that were available during my care of the patient were reviewed by me and considered in my medical decision making (see chart for details).  57 year old female with chronic leg pain and polysubstance abuse presents with worsening bilateral stump pain and is concerned for infection. She is hypertensive and mildly tachycardic but otherwise vitals are normal. She is distressed on exam however, unfortunately, this is her usual presentation. Her stumps do not have any purulent drainage that I can see however they are oozing small amounts of blood. There is some mild redness of the right knee but otherwise they do not appear overtly infected. Discussed with Dr. Leonides Schanz. Will obtain basic labs, blood cultures, lactic acid and give pain control, antibiotics, blood pressure meds.   Leukocytosis from two days ago has resolved. She still has some anemia which is around her baseline. BMP is remarkable for mild hypokalemia and mildly low bicarb. Lactic acid is normal. Will not re-image her stumps since she just had this done two days ago. Prescriptions for Doxy and Vicoprofen  were re-printed and she was encouraged to fill these and resources for inpatient detox were given. Her vitals were not improved on re-check however she is still writhing in the bed and will not stay still for them to be taken.  Final Clinical Impressions(s) / ED Diagnoses   Final diagnoses:  Pain of amputation stump of left lower extremity (HCC)  Pain of amputation stump of  right lower extremity [facility])    ED Discharge Orders    None       Recardo Evangelist, PA-C 01/23/18 8937    Charlesetta Shanks, MD 01/24/18 1650

## 2018-01-23 NOTE — ED Notes (Signed)
[facility] has been notified for transport home.

## 2018-01-23 NOTE — ED Notes (Signed)
Went in to check on pt crying with pain states it's really bad call light in reach no respiratory or acute distress noted.

## 2018-01-23 NOTE — ED Triage Notes (Signed)
Patient arrives by [facility] with complaints of pain and some bleeding to both stumps-states pain started yesterday and states wounds to stumps are worsening.

## 2018-01-23 NOTE — Discharge Instructions (Signed)
Please take Doxycycline twice a day for infection Take Vicoprofen for pain as needed

## 2018-01-23 NOTE — ED Notes (Signed)
[facility] here to transport pt home

## 2018-01-23 NOTE — ED Triage Notes (Signed)
Patient states she is here also for cocaine detox-states last used 2 days ago-smoked 2 crack rocks

## 2018-01-25 LAB — CULTURE, BLOOD (ROUTINE X 2)
CULTURE: NO GROWTH
CULTURE: NO GROWTH
SPECIAL REQUESTS: ADEQUATE
Special Requests: ADEQUATE

## 2018-02-02 ENCOUNTER — Inpatient Hospital Stay (HOSPITAL_COMMUNITY)
Admission: EM | Admit: 2018-02-02 | Discharge: 2018-03-02 | DRG: 853 | Disposition: A | Discharge status: Home / Self Care | Payer: Medicaid Other | Attending: Internal Medicine | Admitting: Internal Medicine

## 2018-02-02 ENCOUNTER — Emergency Department (HOSPITAL_COMMUNITY): Payer: Medicaid Other

## 2018-02-02 ENCOUNTER — Other Ambulatory Visit: Payer: Self-pay

## 2018-02-02 ENCOUNTER — Encounter (HOSPITAL_COMMUNITY): Payer: Self-pay | Admitting: *Deleted

## 2018-02-02 DIAGNOSIS — W050XXA Fall from non-moving wheelchair, initial encounter: Secondary | ICD-10-CM | POA: Diagnosis not present

## 2018-02-02 DIAGNOSIS — R52 Pain, unspecified: Secondary | ICD-10-CM

## 2018-02-02 DIAGNOSIS — Y835 Amputation of limb(s) as the cause of abnormal reaction of the patient, or of later complication, without mention of misadventure at the time of the procedure: Secondary | ICD-10-CM | POA: Diagnosis present

## 2018-02-02 DIAGNOSIS — F17210 Nicotine dependence, cigarettes, uncomplicated: Secondary | ICD-10-CM | POA: Diagnosis not present

## 2018-02-02 DIAGNOSIS — Z89511 Acquired absence of right leg below knee: Secondary | ICD-10-CM | POA: Diagnosis not present

## 2018-02-02 DIAGNOSIS — A419 Sepsis, unspecified organism: Secondary | ICD-10-CM | POA: Diagnosis not present

## 2018-02-02 DIAGNOSIS — Z79891 Long term (current) use of opiate analgesic: Secondary | ICD-10-CM

## 2018-02-02 DIAGNOSIS — I70263 Atherosclerosis of native arteries of extremities with gangrene, bilateral legs: Secondary | ICD-10-CM

## 2018-02-02 DIAGNOSIS — Z89611 Acquired absence of right leg above knee: Secondary | ICD-10-CM | POA: Diagnosis not present

## 2018-02-02 DIAGNOSIS — Z791 Long term (current) use of non-steroidal anti-inflammatories (NSAID): Secondary | ICD-10-CM

## 2018-02-02 DIAGNOSIS — I999 Unspecified disorder of circulatory system: Secondary | ICD-10-CM | POA: Diagnosis not present

## 2018-02-02 DIAGNOSIS — J9621 Acute and chronic respiratory failure with hypoxia: Secondary | ICD-10-CM | POA: Diagnosis not present

## 2018-02-02 DIAGNOSIS — Z89612 Acquired absence of left leg above knee: Secondary | ICD-10-CM | POA: Diagnosis not present

## 2018-02-02 DIAGNOSIS — Z792 Long term (current) use of antibiotics: Secondary | ICD-10-CM

## 2018-02-02 DIAGNOSIS — I10 Essential (primary) hypertension: Secondary | ICD-10-CM | POA: Diagnosis not present

## 2018-02-02 DIAGNOSIS — Z888 Allergy status to other drugs, medicaments and biological substances status: Secondary | ICD-10-CM | POA: Diagnosis not present

## 2018-02-02 DIAGNOSIS — I5032 Chronic diastolic (congestive) heart failure: Secondary | ICD-10-CM | POA: Diagnosis present

## 2018-02-02 DIAGNOSIS — Z751 Person awaiting admission to adequate facility elsewhere: Secondary | ICD-10-CM

## 2018-02-02 DIAGNOSIS — I5033 Acute on chronic diastolic (congestive) heart failure: Secondary | ICD-10-CM | POA: Diagnosis present

## 2018-02-02 DIAGNOSIS — S0003XA Contusion of scalp, initial encounter: Secondary | ICD-10-CM | POA: Diagnosis not present

## 2018-02-02 DIAGNOSIS — F332 Major depressive disorder, recurrent severe without psychotic features: Secondary | ICD-10-CM | POA: Diagnosis present

## 2018-02-02 DIAGNOSIS — Z681 Body mass index (BMI) 19 or less, adult: Secondary | ICD-10-CM | POA: Diagnosis not present

## 2018-02-02 DIAGNOSIS — D638 Anemia in other chronic diseases classified elsewhere: Secondary | ICD-10-CM | POA: Diagnosis present

## 2018-02-02 DIAGNOSIS — Y95 Nosocomial condition: Secondary | ICD-10-CM | POA: Diagnosis present

## 2018-02-02 DIAGNOSIS — G546 Phantom limb syndrome with pain: Secondary | ICD-10-CM | POA: Diagnosis present

## 2018-02-02 DIAGNOSIS — I70262 Atherosclerosis of native arteries of extremities with gangrene, left leg: Secondary | ICD-10-CM | POA: Diagnosis present

## 2018-02-02 DIAGNOSIS — Z89512 Acquired absence of left leg below knee: Secondary | ICD-10-CM

## 2018-02-02 DIAGNOSIS — Z811 Family history of alcohol abuse and dependence: Secondary | ICD-10-CM | POA: Diagnosis not present

## 2018-02-02 DIAGNOSIS — Z803 Family history of malignant neoplasm of breast: Secondary | ICD-10-CM

## 2018-02-02 DIAGNOSIS — E43 Unspecified severe protein-calorie malnutrition: Secondary | ICD-10-CM | POA: Diagnosis present

## 2018-02-02 DIAGNOSIS — T8740 Infection of amputation stump, unspecified extremity: Secondary | ICD-10-CM | POA: Diagnosis present

## 2018-02-02 DIAGNOSIS — M069 Rheumatoid arthritis, unspecified: Secondary | ICD-10-CM | POA: Diagnosis present

## 2018-02-02 DIAGNOSIS — F14988 Cocaine use, unspecified with other cocaine-induced disorder: Secondary | ICD-10-CM | POA: Diagnosis not present

## 2018-02-02 DIAGNOSIS — F419 Anxiety disorder, unspecified: Secondary | ICD-10-CM | POA: Diagnosis present

## 2018-02-02 DIAGNOSIS — F1910 Other psychoactive substance abuse, uncomplicated: Secondary | ICD-10-CM | POA: Diagnosis present

## 2018-02-02 DIAGNOSIS — Z89022 Acquired absence of left finger(s): Secondary | ICD-10-CM

## 2018-02-02 DIAGNOSIS — I110 Hypertensive heart disease with heart failure: Secondary | ICD-10-CM | POA: Diagnosis present

## 2018-02-02 DIAGNOSIS — J189 Pneumonia, unspecified organism: Secondary | ICD-10-CM | POA: Diagnosis not present

## 2018-02-02 DIAGNOSIS — G8929 Other chronic pain: Secondary | ICD-10-CM | POA: Diagnosis present

## 2018-02-02 DIAGNOSIS — D62 Acute posthemorrhagic anemia: Secondary | ICD-10-CM | POA: Diagnosis present

## 2018-02-02 DIAGNOSIS — Z7151 Drug abuse counseling and surveillance of drug abuser: Secondary | ICD-10-CM | POA: Diagnosis not present

## 2018-02-02 DIAGNOSIS — Z9119 Patient's noncompliance with other medical treatment and regimen: Secondary | ICD-10-CM

## 2018-02-02 DIAGNOSIS — R45851 Suicidal ideations: Secondary | ICD-10-CM | POA: Diagnosis present

## 2018-02-02 DIAGNOSIS — E872 Acidosis: Secondary | ICD-10-CM | POA: Diagnosis present

## 2018-02-02 DIAGNOSIS — Z800 Family history of malignant neoplasm of digestive organs: Secondary | ICD-10-CM

## 2018-02-02 DIAGNOSIS — F1420 Cocaine dependence, uncomplicated: Secondary | ICD-10-CM | POA: Diagnosis not present

## 2018-02-02 DIAGNOSIS — Z736 Limitation of activities due to disability: Secondary | ICD-10-CM | POA: Diagnosis not present

## 2018-02-02 DIAGNOSIS — Z79899 Other long term (current) drug therapy: Secondary | ICD-10-CM

## 2018-02-02 LAB — CBC
HCT: 25.5 % — ABNORMAL LOW (ref 36.0–46.0)
Hemoglobin: 8.3 g/dL — ABNORMAL LOW (ref 12.0–15.0)
MCH: 28 pg (ref 26.0–34.0)
MCHC: 32.5 g/dL (ref 30.0–36.0)
MCV: 86.1 fL (ref 78.0–100.0)
PLATELETS: 507 10*3/uL — AB (ref 150–400)
RBC: 2.96 MIL/uL — AB (ref 3.87–5.11)
RDW: 21 % — AB (ref 11.5–15.5)
WBC: 19.1 10*3/uL — ABNORMAL HIGH (ref 4.0–10.5)

## 2018-02-02 LAB — I-STAT CG4 LACTIC ACID, ED: LACTIC ACID, VENOUS: 2.53 mmol/L — AB (ref 0.5–1.9)

## 2018-02-02 LAB — COMPREHENSIVE METABOLIC PANEL
ALT: 10 U/L — AB (ref 14–54)
AST: 26 U/L (ref 15–41)
Albumin: 3.3 g/dL — ABNORMAL LOW (ref 3.5–5.0)
Alkaline Phosphatase: 82 U/L (ref 38–126)
Anion gap: 12 (ref 5–15)
BILIRUBIN TOTAL: 0.9 mg/dL (ref 0.3–1.2)
BUN: 12 mg/dL (ref 6–20)
CHLORIDE: 109 mmol/L (ref 101–111)
CO2: 22 mmol/L (ref 22–32)
CREATININE: 0.66 mg/dL (ref 0.44–1.00)
Calcium: 9.1 mg/dL (ref 8.9–10.3)
Glucose, Bld: 113 mg/dL — ABNORMAL HIGH (ref 65–99)
Potassium: 3.3 mmol/L — ABNORMAL LOW (ref 3.5–5.1)
Sodium: 143 mmol/L (ref 135–145)
TOTAL PROTEIN: 7.5 g/dL (ref 6.5–8.1)

## 2018-02-02 LAB — TROPONIN I: TROPONIN I: 0.09 ng/mL — AB (ref <0.03)

## 2018-02-02 LAB — PROTIME-INR
INR: 1.28
Prothrombin Time: 15.9 seconds — ABNORMAL HIGH (ref 11.4–15.2)

## 2018-02-02 LAB — I-STAT BETA HCG BLOOD, ED (MC, WL, AP ONLY): I-stat hCG, quantitative: 8.5 m[IU]/mL — ABNORMAL HIGH (ref <5)

## 2018-02-02 LAB — MRSA PCR SCREENING: MRSA by PCR: NEGATIVE

## 2018-02-02 LAB — LACTIC ACID, PLASMA: Lactic Acid, Venous: 1.5 mmol/L (ref 0.5–1.9)

## 2018-02-02 LAB — PROCALCITONIN: Procalcitonin: 0.17 ng/mL

## 2018-02-02 LAB — APTT: APTT: 29 s (ref 24–36)

## 2018-02-02 MED ORDER — SODIUM CHLORIDE 0.9 % IV BOLUS
2000.0000 mL | Freq: Once | INTRAVENOUS | Status: AC
  Administered 2018-02-02: 2000 mL via INTRAVENOUS

## 2018-02-02 MED ORDER — ZOLPIDEM TARTRATE 5 MG PO TABS
5.0000 mg | ORAL_TABLET | Freq: Every evening | ORAL | Status: DC | PRN
  Administered 2018-02-20: 5 mg via ORAL
  Filled 2018-02-02: qty 1

## 2018-02-02 MED ORDER — SODIUM CHLORIDE 0.9 % IV SOLN: 2.0000 g | Freq: Once | INTRAVENOUS | Status: DC

## 2018-02-02 MED ORDER — MORPHINE SULFATE (PF) 4 MG/ML IV SOLN
2.0000 mg | INTRAVENOUS | Status: DC | PRN
  Administered 2018-02-02 – 2018-02-03 (×7): 2 mg via INTRAVENOUS
  Filled 2018-02-02 (×7): qty 1

## 2018-02-02 MED ORDER — SODIUM CHLORIDE 0.9 % IV SOLN
1.0000 g | Freq: Three times a day (TID) | INTRAVENOUS | Status: DC
  Administered 2018-02-02 – 2018-02-06 (×11): 1 g via INTRAVENOUS
  Filled 2018-02-02 (×16): qty 1

## 2018-02-02 MED ORDER — AMLODIPINE BESYLATE 10 MG PO TABS
10.0000 mg | ORAL_TABLET | Freq: Every day | ORAL | Status: DC
  Administered 2018-02-02 – 2018-02-12 (×11): 10 mg via ORAL
  Filled 2018-02-02 (×11): qty 1

## 2018-02-02 MED ORDER — PIPERACILLIN-TAZOBACTAM 3.375 G IVPB 30 MIN
3.3750 g | Freq: Once | INTRAVENOUS | Status: AC
  Administered 2018-02-02: 3.375 g via INTRAVENOUS
  Filled 2018-02-02: qty 50

## 2018-02-02 MED ORDER — SODIUM CHLORIDE 0.9 % IV SOLN
INTRAVENOUS | Status: DC
  Administered 2018-02-02 – 2018-02-04 (×3): via INTRAVENOUS

## 2018-02-02 MED ORDER — ONDANSETRON HCL 4 MG PO TABS
4.0000 mg | ORAL_TABLET | Freq: Three times a day (TID) | ORAL | Status: DC | PRN
  Administered 2018-02-02: 4 mg via ORAL
  Filled 2018-02-02: qty 1

## 2018-02-02 MED ORDER — ESCITALOPRAM OXALATE 10 MG PO TABS
20.0000 mg | ORAL_TABLET | Freq: Every day | ORAL | Status: DC
  Administered 2018-02-02 – 2018-02-28 (×26): 20 mg via ORAL
  Filled 2018-02-02 (×13): qty 2
  Filled 2018-02-02: qty 1
  Filled 2018-02-02 (×13): qty 2
  Filled 2018-02-02: qty 1

## 2018-02-02 MED ORDER — ONDANSETRON HCL 4 MG PO TABS
4.0000 mg | ORAL_TABLET | Freq: Four times a day (QID) | ORAL | Status: DC | PRN
  Filled 2018-02-02: qty 1

## 2018-02-02 MED ORDER — VANCOMYCIN HCL IN DEXTROSE 1-5 GM/200ML-% IV SOLN
1000.0000 mg | Freq: Once | INTRAVENOUS | Status: AC
  Administered 2018-02-02: 1000 mg via INTRAVENOUS
  Filled 2018-02-02: qty 200

## 2018-02-02 MED ORDER — ALUM & MAG HYDROXIDE-SIMETH 200-200-20 MG/5ML PO SUSP
30.0000 mL | Freq: Four times a day (QID) | ORAL | Status: DC | PRN
  Administered 2018-02-02: 30 mL via ORAL
  Filled 2018-02-02: qty 30

## 2018-02-02 MED ORDER — VANCOMYCIN HCL IN DEXTROSE 1-5 GM/200ML-% IV SOLN: 1000.0000 mg | Freq: Once | INTRAVENOUS | Status: DC

## 2018-02-02 MED ORDER — SODIUM CHLORIDE 0.9 % IV SOLN
1.0000 g | Freq: Two times a day (BID) | INTRAVENOUS | Status: DC
  Filled 2018-02-02: qty 1

## 2018-02-02 MED ORDER — ONDANSETRON HCL 4 MG/2ML IJ SOLN
4.0000 mg | Freq: Four times a day (QID) | INTRAMUSCULAR | Status: DC | PRN
  Administered 2018-02-02 – 2018-02-17 (×2): 4 mg via INTRAVENOUS
  Filled 2018-02-02 (×2): qty 2

## 2018-02-02 MED ORDER — POLYETHYLENE GLYCOL 3350 17 G PO PACK: 17.0000 g | PACK | Freq: Every day | ORAL | Status: DC | PRN

## 2018-02-02 MED ORDER — VANCOMYCIN HCL IN DEXTROSE 750-5 MG/150ML-% IV SOLN
750.0000 mg | INTRAVENOUS | Status: DC
  Administered 2018-02-03 – 2018-02-07 (×5): 750 mg via INTRAVENOUS
  Filled 2018-02-02 (×5): qty 150

## 2018-02-02 MED ORDER — MORPHINE SULFATE (PF) 4 MG/ML IV SOLN
8.0000 mg | Freq: Once | INTRAVENOUS | Status: AC
  Administered 2018-02-02: 8 mg via INTRAVENOUS
  Filled 2018-02-02: qty 2

## 2018-02-02 MED ORDER — BUSPIRONE HCL 5 MG PO TABS
15.0000 mg | ORAL_TABLET | Freq: Three times a day (TID) | ORAL | Status: DC
  Administered 2018-02-03 – 2018-02-25 (×69): 15 mg via ORAL
  Filled 2018-02-02 (×70): qty 1

## 2018-02-02 MED ORDER — ENOXAPARIN SODIUM 40 MG/0.4ML ~~LOC~~ SOLN
40.0000 mg | SUBCUTANEOUS | Status: DC
  Administered 2018-02-02: 40 mg via SUBCUTANEOUS
  Filled 2018-02-02: qty 0.4

## 2018-02-02 MED ORDER — VANCOMYCIN HCL 500 MG IV SOLR: 500.0000 mg | INTRAVENOUS | Status: DC

## 2018-02-02 NOTE — ED Notes (Signed)
Bed: WA20 Expected date:  Expected time:  Means of arrival:  Comments: Closed until 1430

## 2018-02-02 NOTE — ED Notes (Signed)
Date and time results received: 02/02/18 1420 (use smartphrase ".now" to insert current time)  Test: Troponin Critical Value: 0.09  Name of Provider Notified: Dr. Venora Maples  Orders Received? Or Actions Taken?:

## 2018-02-02 NOTE — H&P (Addendum)
History and Physical  [name] SEG:[id number] DOB: [birth year] DOA: 02/02/2018  Referring physician: Jola Schmidt, ER physician PCP: Scot Jun, FNP  Outpatient Specialists: [facility],  Patient coming from: Home & is able to ambulate via wheelchair  Chief Complaint: Trouble breathing  HPI: [name] is a 57 y.o. 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  In the emergency room, patient's white blood cell count elevated at 19 and lactic acid level elevated at 2.5.  Chest x-ray noted signs of right sided pneumonia as well as soft tissue swelling with new subcutaneous gas seen in her right stump on x-ray.  Patient started on broad-spectrum IV antibiotics and fluid bolus.  Noted to be hypoxic on room air requiring 2 L.  Orthopedics notified and hospitalist called for further evaluation and admission.  Patient also verbalized suicidal ideation saying that she wanted to kill herself and did not want to live this way although she did not specify a plan.  Review of Systems: Patient seen after arrival to ICU. She moans, complaining of being in pain, but does not otherwise give a focal review of systems despite being asked.   Past Medical History:  Diagnosis Date  . CAP (community acquired pneumonia) 03/2014 X 2  . Cocaine abuse ([place])    ongoing with resultant vaculitis.  . Depression   . Headache    "weekly" (07/29/2016)  . Hypertension   . Inflammatory arthritis   . Migraines    "probably 5-6/yr" (07/29/2016)  . Normocytic anemia    BL Hgb 9.8-12. Last anemia panel 04/2010 - showing Fe 19, ferritin 101.  Pt on monthly B12 injections  . Rheumatoid arthritis(714.0)    patient reported  . 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 Followed by Dr. Louanne Skye   Past Surgical History:  Procedure Laterality Date  . AMPUTATION Left 05/22/2016   Procedure: AMPUTATION LEFT LONG FINGER;  Surgeon: Marybelle Killings, MD;  Location: [facility];  Service: Orthopedics;  Laterality: Left;  . AMPUTATION Bilateral 04/10/2017   Procedure: AMPUTATION BELOW KNEE;  Surgeon: Newt Minion, MD;  Location: [facility];  Service: Orthopedics;  Laterality: Bilateral;  . HERNIA REPAIR     "stomach"  . I&D EXTREMITY Right 09/26/2015   Procedure: IRRIGATION AND DEBRIDEMENT LEG WOUND  VAC PLACEMENT.;  Surgeon: Loel Lofty Dillingham, DO;  Location: [facility];  Service: Plastics;  Laterality: Right;  . INCISION AND DRAINAGE OF WOUND Bilateral 10/20/2016   Procedure: IRRIGATION AND DEBRIDEMENT WOUND BILATERAL;  Surgeon: Edrick Kins, DPM;  Location: [facility];  Service: Podiatry;  Laterality: Bilateral;  . IRRIGATION AND DEBRIDEMENT ABSCESS Bilateral 09/26/2013   Procedure: DEBRIDEMENT ULCERS BILATERAL THIGHS;  Surgeon: Gwenyth Ober, MD;  Location: [facility];  Service: General;  Laterality: Bilateral;  . SKIN BIOPSY Bilateral    shin nodules    Social History:  reports that she has been smoking cigarettes.  She has a 4.56 pack-year smoking history. She has never used smokeless tobacco. She reports that she has current or past drug history. Drugs: "Crack" cocaine and Cocaine. She reports that she does not drink alcohol.  Ongoing drug use, ambulates via wheelchair.  Lives alone  Allergies  Allergen Reactions  . Acetaminophen Swelling and Other (See Comments)    Reaction:  Eyelid swelling  . Lisinopril Other (See Comments)    Unk    Family History  Problem Relation Age of Onset  . Breast cancer Mother        Breast cancer  . Alcohol abuse Mother   . Colon cancer Maternal Aunt 91  . Alcohol abuse Father       Prior to Admission medications   Medication Sig Start Date End Date Taking? Authorizing  Provider  doxycycline (VIBRAMYCIN) 100 MG capsule Take 1 capsule (100 mg total) by mouth 2 (two) times daily. 01/23/18  Yes Recardo Evangelist, PA-C  HYDROcodone-ibuprofen (VICOPROFEN) 7.5-200 MG tablet Take 1 tablet by mouth every 6 (six) hours as needed for moderate pain. 01/23/18  Yes Recardo Evangelist, PA-C  ibuprofen (ADVIL,MOTRIN) 200 MG tablet Take 200 mg by mouth daily as needed for moderate pain.   Yes [provider]  naproxen sodium (ALEVE) 220 MG tablet Take 220 mg by mouth daily as needed (pain).   Yes [provider]  amLODipine (NORVASC) 10 MG tablet Take 1 tablet (10 mg total) by mouth daily. Patient not taking: Reported on 02/02/2018 09/14/17   Frederica Kuster, PA-C  busPIRone (BUSPAR) 15 MG tablet Take 1 tablet (15 mg total) by mouth 3 (three) times daily. Patient not taking: Reported on 02/02/2018 07/26/17   Scot Jun, FNP  cyclobenzaprine (FLEXERIL) 5 MG tablet Take 1 tablet (5 mg total) by mouth 3 (three) times daily as needed (muscle soreness). Patient not taking: Reported on 02/02/2018 07/04/17   Rolland Porter, MD  escitalopram (LEXAPRO) 10 MG tablet Take 2 tablets (20 mg total) by mouth at bedtime. Patient not taking: Reported on 02/02/2018 07/26/17   Scot Jun, FNP  gabapentin (NEURONTIN) 600 MG tablet Take 1 tablet (600 mg total) by mouth 3 (three) times daily. Patient not taking: Reported on 02/02/2018 07/26/17   Scot Jun, FNP  hydrochlorothiazide (HYDRODIURIL) 25 MG tablet Take 1 tablet (25 mg total) daily by mouth. Patient not taking: Reported on 02/02/2018 08/11/17   Dorie Rank, MD  potassium chloride (K-DUR) 10 MEQ tablet Take 1 tablet (10 mEq total) daily by mouth. Patient not taking: Reported on 02/02/2018 08/11/17   Dorie Rank, MD    Physical Exam: BP 126/68   Pulse (!) 101   Temp 99 F (37.2 C) (Oral)   Resp (!) 27   Ht 5' 1"  (1.549 m) Comment: Per pt  Wt 36.2 kg (79 lb 12.9 oz)   SpO2 97%   BMI 15.08 kg/m   General:  Somewhat somnolent, moans in pain, moderate distress Eyes: Sclera nonicteric, extraocular movements appear intact ENT: Normocephalic and atraumatic, mucous members are dry Neck: Supple, no JVD Cardiovascular: Regular rhythm, tachycardic Respiratory: Scattered rhonchi Abdomen: Soft, nontender, hypoactive bowel sounds Skin: Several areas on both her ears with small areas of necrosis.  Patient's lower extremity stumps post BKA also necrotic at both bases Musculoskeletal: As above, status post BKA Psychiatric: Tearful, no evidence of acute psychosis Neurologic: Limited exam due to BKA, patient's pain          Labs on Admission:  Basic Metabolic Panel: Recent Labs  Lab 02/02/18 1324  NA 143  K 3.3*  CL 109  CO2 22  GLUCOSE 113*  BUN 12  CREATININE 0.66  CALCIUM 9.1   Liver Function Tests: Recent Labs  Lab 02/02/18 1324  AST 26  ALT 10*  ALKPHOS 82  BILITOT 0.9  PROT 7.5  ALBUMIN 3.3*   No results for input(s): LIPASE, AMYLASE in the last 168 hours. No results for input(s): AMMONIA in the last 168 hours. CBC: Recent Labs  Lab 02/02/18 1324  WBC 19.1*  HGB 8.3*  HCT 25.5*  MCV 86.1  PLT 507*   Cardiac Enzymes: Recent Labs  Lab 02/02/18 1324  TROPONINI 0.09*    BNP (last 3 results) No results for input(s): BNP in the last 8760 hours.  ProBNP (last 3 results) No results for input(s): PROBNP in the last 8760 hours.  CBG: No results for input(s): GLUCAP in the last 168 hours.  Radiological Exams on Admission: Dg Chest 2 View  Addendum Date: 02/02/2018   ADDENDUM REPORT: 02/02/2018 14:33 ADDENDUM: Acute findings discussed with and reconfirmed by Dr.KEVIN CAMPOS on 02/02/2018 at 2:32 pm. Electronically Signed   By: Elon Alas M.D.   On: 02/02/2018 14:33   Result Date: 02/02/2018 CLINICAL DATA:  Mid chest pain, cough and congestion. EXAM: CHEST - 2 VIEW COMPARISON:  Chest radiograph January 20, 2018 FINDINGS: Multifocal RIGHT lung consolidation. Mild chronic  interstitial changes without pleural effusion. Borderline cardiomegaly. Calcified aortic knob. No pneumothorax. Soft tissue planes included osseous structures are nonsuspicious. IMPRESSION: 1. Multifocal RIGHT pneumonia. 2. Borderline cardiomegaly. 3.  Aortic Atherosclerosis (ICD10-I70.0). Electronically Signed: By: Elon Alas M.D. On: 02/02/2018 14:28   Dg Knee 1-2 Views Left  Result Date: 02/02/2018 CLINICAL DATA:  Knee pain.  Status post BKA. EXAM: LEFT KNEE - 1-2 VIEW COMPARISON:  01/20/2018 FINDINGS: Bones are demineralized. No evidence for an acute fracture. No subluxation or dislocation. No worrisome lytic or sclerotic osseous abnormality. No gross joint effusion evident. There is some soft tissue irregularity at the stump. IMPRESSION: Osteopenia without acute bony abnormality. Soft tissue irregularity at the stump may be postoperative scarring although wound/ulcer could have this appearance. Electronically Signed   By: Misty Stanley M.D.   On: 02/02/2018 14:30   Dg Knee 1-2 Views Right  Result Date: 02/02/2018 CLINICAL DATA:  Pain and necrosis at stump, bleeding. EXAM: RIGHT KNEE - 1-2 VIEW COMPARISON:  RIGHT knee radiograph January 20, 2018 FINDINGS: Status post below-knee amputation. No acute osseous process. No rib fracture deformity or dislocation. No advanced degenerative change for age. Increasing soft tissue swelling with superficial irregularity and subcutaneous gas about the stump. IMPRESSION: Soft tissue swelling with new subcutaneous gas seen with necrotizing fasciitis or recent instrumentation. No acute osseous process. Acute findings discussed with and reconfirmed by Dr.KEVIN CAMPOS on 02/02/2018 at 2:32 pm. Electronically Signed   By: Elon Alas M.D.   On: 02/02/2018 14:32    EKG: Not done  Assessment/Plan Present on Admission: . Essential hypertension: Patient poorly compliant with antihypertensive medications.  Likely her blood pressures are only stable because of  sepsis.  Rehydrating.  Have restarted oral med home medications plus as needed hydralazine. . 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 MDD (major depressive disorder), recurrent episode, severe ([place]) with suicidal ideation: Engineer, materials.  Have consulted psychiatry . Protein-calorie malnutrition, severe ([place]): In the context of her chronic illness.  Have consulted nutrition for assessment  . Atherosclerosis of native arteries of extremities with gangrene, left leg ([facility]) status post bilateral BKA's.:  Questionable acute gas is seen on x-ray.  Ortho notified who will contact Dr.D who will come see patientuda  . Sepsis secondary to HCAP (healthcare-associated pneumonia) causing acute on chronic respiratory failure with hypoxia: Principal  problem.  Patient meets criteria for sepsis on admission given tachycardia, tachypnea, decreased oxygen saturations on room air, pulmonary source, lactic acidosis and leukocytosis.  Aggressive IV fluid resuscitation plus broad-spectrum IV antibiotics.  Will follow lactic acid level and procalcitonin.  Blood cultures pending.  Chronic diastolic heart failure: Likely patient will end up going into acute failure from aggressive fluid resuscitation.  Monitor daily weights and input/output.  May need diuresis prior to discharge once sepsis stabilized   Principal Problem:   Sepsis ([place]) Active Problems:   Essential hypertension   Cocaine-induced vascular disorder (H[facility])   Cocaine use disorder, severe, dependence (HCC)   MDD (major depressive disorder), recurrent episode, severe ([place])   Protein-calorie malnutrition, severe (H[facility])   Atherosclerosis of native arteries of extremities with gangrene, left leg (HCC)   Suicidal ideation   HCAP (healthcare-associated pneumonia)   Acute on chronic respiratory failure with hypoxia ([facility])   DVT prophylaxis: Lovenox  Code Status: Full code  Family Communication: No family  Disposition  Plan: Continue in ICU until breathing and sepsis stabilized, seen by psychiatry  Consults called: Psychiatry   Admission status: Given need for acute inpatient services, patient's critical illness, will be here past 2 midnights therefore admit as inpatient  I have spent 45 minutes in the care of this critically ill patient including medical decision making, bedside examination and review of patient's records, lab work and radiology films.  Annita Brod MD [facility] Pager [phone]  If 7PM-7AM, please contact night-coverage [email or web address] Password [facility]  02/02/2018, 6:55 PM

## 2018-02-02 NOTE — ED Triage Notes (Addendum)
Per EMS, pt called EMS for shortness of breath and anxiety. EMS states the family wanted the pt to go [facility]. Pt has bilateral BTK amputation.  BP 160/90 O2 98% RA CBG 153   When talking to pt, pt reports she is feeling suicidal. States "I'm tired of dealing with all this".

## 2018-02-02 NOTE — ED Notes (Signed)
Patient transported to X-ray 

## 2018-02-02 NOTE — Progress Notes (Signed)
MD made aware of patient's complaints of pain. MD to come up to SD unit. Will continue to monitor.

## 2018-02-02 NOTE — Progress Notes (Addendum)
Pharmacy Antibiotic Note  [name] is a 57 y.o. female admitted on 02/02/2018 with pneumonia.  Pharmacy has been consulted for vancomycin and cefepime dosing.  Plan:  Vancomycin 1000 mg IV now, then 750 mg IV q24 hr (est AUC 636 based on SCr 0.66) - this AUC is higher than recommended; however I believe that her CrCl is not reflective of GFR d/t bilateral BKAs, which is supported by Pharmacy's previous vancomycin dosing experience. 750 mg q24 is the dose that was previously selected based on vancomycin levels with similar SCr.  Measure vancomycin AUC at steady state as indicated  Cefepime 1 g IV q8 hr, again using the same rationale as above for higher than recommended dosing for CrCl.  SCr q48 hr while on vanc/Cefepime combination  F/u MRSA PCR results to guide vancomycin LOT   Height: 5' 1"  (154.9 cm)(Per pt) Weight: 79 lb 12.9 oz (36.2 kg) IBW/kg (Calculated) : 47.8  Temp (24hrs), Avg:98.6 F (37 C), Min:98.1 F (36.7 C), Max:99 F (37.2 C)  Recent Labs  Lab 02/02/18 1324 02/02/18 1505  WBC 19.1*  --   CREATININE 0.66  --   LATICACIDVEN  --  2.53*    Estimated Creatinine Clearance: 45.9 mL/min (by C-G formula based on SCr of 0.66 mg/dL).    Allergies  Allergen Reactions  . Acetaminophen Swelling and Other (See Comments)    Reaction:  Eyelid swelling  . Lisinopril Other (See Comments)    Unk    Thank you for allowing pharmacy to be a part of this patient's care.  Reuel Boom, PharmD, BCPS [phone] 02/02/2018, 6:51 PM

## 2018-02-02 NOTE — ED Provider Notes (Signed)
[facility] DEPT Provider Note   CSN: [id number] Arrival date & time: 02/02/18  1241     History   Chief Complaint Chief Complaint  Patient presents with  . Suicidal    HPI [name] is a 57 y.o. female.  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  She has chronic pain in these legs.  She feels like the pain is been worsening over the past 2 weeks.  No fevers or chills.  She is extremely tearful at this time.  She is suicidal.  She states she is tired of living like this.  She reports increasing anxiety and chest pain associated with it this morning.  She admits to ongoing cocaine abuse. Admits to SOB   Past Medical History:  Diagnosis Date  . CAP (community acquired pneumonia) 03/2014 X 2  . Cocaine abuse ([place])    ongoing with resultant vaculitis.  . Depression   . Headache    "weekly" (07/29/2016)  . Hypertension   . Inflammatory arthritis   . Migraines    "probably 5-6/yr" (07/29/2016)  . Normocytic anemia    BL Hgb 9.8-12. Last anemia panel 04/2010 - showing Fe 19, ferritin 101.  Pt on monthly B12 injections  . Rheumatoid arthritis(714.0)    patient reported  . 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 Followed by Dr. Louanne Skye    Patient Active Problem List   Diagnosis Date Noted  . Ulcer of amputation stump of lower extremity ([place]) 10/23/2017  . 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 Hypertensive crisis   . Phantom limb pain ([place])   . S/P bilateral below knee amputation ([place]) 04/13/2017  . Tobacco abuse   . Post-operative pain   . Acute blood loss anemia   . 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 Wound  infection 03/30/2017  . AKI (acute kidney injury) ([place]) 02/07/2017  . Acute kidney injury ([place]) 02/06/2017  . Wound healing, delayed   . Chest pain 04/07/2015  . Protein-calorie malnutrition, severe ([place]) 01/15/2015  . MDD (major depressive disorder), recurrent episode, severe ([place]) 10/16/2014  . Cocaine use disorder, severe, dependence ([place]) 10/10/2014  . 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 Essential hypertension 02/26/2010    Past Surgical History:  Procedure Laterality Date  . AMPUTATION Left 05/22/2016   Procedure: AMPUTATION LEFT LONG FINGER;  Surgeon: Marybelle Killings, MD;  Location: [facility];  Service: Orthopedics;  Laterality: Left;  . AMPUTATION Bilateral 04/10/2017   Procedure: AMPUTATION BELOW KNEE;  Surgeon: Newt Minion, MD;  Location: [facility];  Service: Orthopedics;  Laterality: Bilateral;  . HERNIA REPAIR     "stomach"  . I&D EXTREMITY Right 09/26/2015   Procedure: IRRIGATION AND DEBRIDEMENT LEG WOUND  VAC PLACEMENT.;  Surgeon: Loel Lofty Dillingham, DO;  Location: [facility];  Service: Plastics;  Laterality: Right;  . INCISION AND DRAINAGE OF WOUND Bilateral 10/20/2016   Procedure: IRRIGATION AND DEBRIDEMENT WOUND BILATERAL;  Surgeon: Edrick Kins, DPM;  Location: [facility];  Service: Podiatry;  Laterality: Bilateral;  . IRRIGATION AND DEBRIDEMENT ABSCESS Bilateral 09/26/2013   Procedure: DEBRIDEMENT ULCERS BILATERAL THIGHS;  Surgeon: Gwenyth Ober, MD;  Location: [facility];  Service: General;  Laterality: Bilateral;  .  SKIN BIOPSY Bilateral    shin nodules     OB History   None      Home Medications    Prior to Admission medications   Medication Sig Start Date End Date Taking? Authorizing Provider  amLODipine (NORVASC) 10 MG tablet Take 1 tablet (10 mg total) by mouth daily. Patient not taking: Reported on 10/09/2017 09/14/17   Frederica Kuster, PA-C  busPIRone (BUSPAR) 15 MG tablet Take 1 tablet (15 mg total) by mouth 3 (three) times daily. Patient not taking:  Reported on 08/09/2017 07/26/17   Scot Jun, FNP  cyclobenzaprine (FLEXERIL) 5 MG tablet Take 1 tablet (5 mg total) by mouth 3 (three) times daily as needed (muscle soreness). Patient not taking: Reported on 08/09/2017 07/04/17   Rolland Porter, MD  doxycycline (VIBRAMYCIN) 100 MG capsule Take 1 capsule (100 mg total) by mouth 2 (two) times daily. 01/23/18   Recardo Evangelist, PA-C  escitalopram (LEXAPRO) 10 MG tablet Take 2 tablets (20 mg total) by mouth at bedtime. Patient not taking: Reported on 08/09/2017 07/26/17   Scot Jun, FNP  gabapentin (NEURONTIN) 600 MG tablet Take 1 tablet (600 mg total) by mouth 3 (three) times daily. Patient not taking: Reported on 08/09/2017 07/26/17   Scot Jun, FNP  hydrochlorothiazide (HYDRODIURIL) 25 MG tablet Take 1 tablet (25 mg total) daily by mouth. Patient not taking: Reported on 09/14/2017 08/11/17   Dorie Rank, MD  HYDROcodone-ibuprofen (VICOPROFEN) 7.5-200 MG tablet Take 1 tablet by mouth every 6 (six) hours as needed for moderate pain. 01/23/18   Recardo Evangelist, PA-C  potassium chloride (K-DUR) 10 MEQ tablet Take 1 tablet (10 mEq total) daily by mouth. Patient not taking: Reported on 09/14/2017 08/11/17   Dorie Rank, MD    Family History Family History  Problem Relation Age of Onset  . Breast cancer Mother        Breast cancer  . Alcohol abuse Mother   . Colon cancer Maternal Aunt 58  . Alcohol abuse Father     Social History Social History   Tobacco Use  . Smoking status: Current Every Day Smoker    Packs/day: 0.12    Years: 38.00    Pack years: 4.56    Types: Cigarettes  . Smokeless tobacco: Never Used  . Tobacco comment: 2 A DAY  Substance Use Topics  . Alcohol use: No    Alcohol/week: 0.0 oz  . Drug use: Yes    Types: "Crack" cocaine, Cocaine    Comment: Smoked crack 06/02/2017     Allergies   Acetaminophen and Lisinopril   Review of Systems Review of Systems  All other systems reviewed and are  negative.    Physical Exam Updated Vital Signs BP (!) 166/79 (BP Location: Right Arm)   Pulse (!) 113   Temp 98.1 F (36.7 C) (Oral)   Resp 18   SpO2 96%   Physical Exam  Constitutional: She is oriented to person, place, and time. She appears well-developed and well-nourished.  HENT:  Head: Normocephalic.  Eyes: EOM are normal.  Neck: Normal range of motion.  Cardiovascular: Normal rate.  Pulmonary/Chest: Effort normal and breath sounds normal.  Abdominal: Soft. She exhibits no distension.  Musculoskeletal:  Chronic necrotic appearing wounds of her bilateral BKA's both on the distal aspect as well as the anterior knees bilaterally without surrounding erythema.  Neurological: She is alert and oriented to person, place, and time.  Psychiatric:  Tearful.  Suicidal.  Nursing note  and vitals reviewed.    ED Treatments / Results  Labs (all labs ordered are listed, but only abnormal results are displayed) Labs Reviewed  CBC - Abnormal; Notable for the following components:      Result Value   WBC 19.1 (*)    RBC 2.96 (*)    Hemoglobin 8.3 (*)    HCT 25.5 (*)    RDW 21.0 (*)    Platelets 507 (*)    All other components within normal limits  COMPREHENSIVE METABOLIC PANEL - Abnormal; Notable for the following components:   Potassium 3.3 (*)    Glucose, Bld 113 (*)    Albumin 3.3 (*)    ALT 10 (*)    All other components within normal limits  TROPONIN I - Abnormal; Notable for the following components:   Troponin I 0.09 (*)    All other components within normal limits  I-STAT BETA HCG BLOOD, ED ([facility], [facility], [facility] ONLY) - Abnormal; Notable for the following components:   I-stat hCG, quantitative 8.5 (*)    All other components within normal limits  I-STAT CG4 LACTIC ACID, ED - Abnormal; Notable for the following components:   Lactic Acid, Venous 2.53 (*)    All other components within normal limits  CULTURE, BLOOD (ROUTINE X 2)  CULTURE, BLOOD (ROUTINE X 2)  MRSA PCR  SCREENING  HIV ANTIBODY (ROUTINE TESTING)  I-STAT CG4 LACTIC ACID, ED    EKG None  Radiology Dg Chest 2 View  Addendum Date: 02/02/2018   ADDENDUM REPORT: 02/02/2018 14:33 ADDENDUM: Acute findings discussed with and reconfirmed by Dr.Alika Saladin on 02/02/2018 at 2:32 pm. Electronically Signed   By: Elon Alas M.D.   On: 02/02/2018 14:33   Result Date: 02/02/2018 CLINICAL DATA:  Mid chest pain, cough and congestion. EXAM: CHEST - 2 VIEW COMPARISON:  Chest radiograph January 20, 2018 FINDINGS: Multifocal RIGHT lung consolidation. Mild chronic interstitial changes without pleural effusion. Borderline cardiomegaly. Calcified aortic knob. No pneumothorax. Soft tissue planes included osseous structures are nonsuspicious. IMPRESSION: 1. Multifocal RIGHT pneumonia. 2. Borderline cardiomegaly. 3.  Aortic Atherosclerosis (ICD10-I70.0). Electronically Signed: By: Elon Alas M.D. On: 02/02/2018 14:28   Dg Knee 1-2 Views Left  Result Date: 02/02/2018 CLINICAL DATA:  Knee pain.  Status post BKA. EXAM: LEFT KNEE - 1-2 VIEW COMPARISON:  01/20/2018 FINDINGS: Bones are demineralized. No evidence for an acute fracture. No subluxation or dislocation. No worrisome lytic or sclerotic osseous abnormality. No gross joint effusion evident. There is some soft tissue irregularity at the stump. IMPRESSION: Osteopenia without acute bony abnormality. Soft tissue irregularity at the stump may be postoperative scarring although wound/ulcer could have this appearance. Electronically Signed   By: Misty Stanley M.D.   On: 02/02/2018 14:30   Dg Knee 1-2 Views Right  Result Date: 02/02/2018 CLINICAL DATA:  Pain and necrosis at stump, bleeding. EXAM: RIGHT KNEE - 1-2 VIEW COMPARISON:  RIGHT knee radiograph January 20, 2018 FINDINGS: Status post below-knee amputation. No acute osseous process. No rib fracture deformity or dislocation. No advanced degenerative change for age. Increasing soft tissue swelling with superficial  irregularity and subcutaneous gas about the stump. IMPRESSION: Soft tissue swelling with new subcutaneous gas seen with necrotizing fasciitis or recent instrumentation. No acute osseous process. Acute findings discussed with and reconfirmed by Dr.Debralee Braaksma on 02/02/2018 at 2:32 pm. Electronically Signed   By: Elon Alas M.D.   On: 02/02/2018 14:32    Procedures Procedures (including critical care time)  Medications Ordered in ED  Medications  zolpidem (AMBIEN) tablet 5 mg (has no administration in time range)  ondansetron (ZOFRAN) tablet 4 mg (4 mg Oral Given 02/02/18 1419)  alum & mag hydroxide-simeth (MAALOX/MYLANTA) 200-200-20 MG/5ML suspension 30 mL (30 mLs Oral Given 02/02/18 1418)  vancomycin (VANCOCIN) IVPB 1000 mg/200 mL premix (1,000 mg Intravenous New Bag/Given 02/02/18 1547)  piperacillin-tazobactam (ZOSYN) IVPB 3.375 g (0 g Intravenous Stopped 02/02/18 1546)  morphine 4 MG/ML injection 8 mg (8 mg Intravenous Given 02/02/18 1546)  sodium chloride 0.9 % bolus 2,000 mL (2,000 mLs Intravenous New Bag/Given 02/02/18 1506)     Initial Impression / Assessment and Plan / ED Course  I have reviewed the triage vital signs and the nursing notes.  Pertinent labs & imaging results that were available during my care of the patient were reviewed by me and considered in my medical decision making (see chart for details).     White blood cell count 19,000.  Lactate is less than 2.  Concern for healthcare associated pneumonia given findings on chest x-ray.  This is like the cause of her chest pain shortness of breath.  Doubt pulmonary embolism.  In regards to her chronic necrotic BKA's.  These likely appear chronic.  There is a small focus of air in the right distal aspect.  This may just be due from necrosis.  Infection is not excluded.  Broad-spectrum antibiotics given for healthcare associated pneumonia should also cover her leg.  Case was discussed with orthopedic surgery who will inform her  primary orthopedist for consultation in the hospital.  Patient will be admitted to the stepdown unit with the hospitalist.  While in the hospital her depression and suicidal thoughts will need to be addressed by inpatient psych.  Consultations:  Orthopedics: Dr [name] Hospitalist: Dr Maryland Pink  Dispo: Admit  Final Clinical Impressions(s) / ED Diagnoses   Final diagnoses:  HCAP (healthcare-associated pneumonia)    ED Discharge Orders    None       Jola Schmidt, MD 02/02/18 1649

## 2018-02-03 DIAGNOSIS — F332 Major depressive disorder, recurrent severe without psychotic features: Secondary | ICD-10-CM

## 2018-02-03 DIAGNOSIS — Z736 Limitation of activities due to disability: Secondary | ICD-10-CM

## 2018-02-03 DIAGNOSIS — R45851 Suicidal ideations: Secondary | ICD-10-CM

## 2018-02-03 DIAGNOSIS — F17210 Nicotine dependence, cigarettes, uncomplicated: Secondary | ICD-10-CM

## 2018-02-03 DIAGNOSIS — Z811 Family history of alcohol abuse and dependence: Secondary | ICD-10-CM

## 2018-02-03 LAB — CBC
HEMATOCRIT: 18.9 % — AB (ref 36.0–46.0)
HEMATOCRIT: 32.5 % — AB (ref 36.0–46.0)
HEMOGLOBIN: 6.2 g/dL — AB (ref 12.0–15.0)
HEMOGLOBIN: 11 g/dL — AB (ref 12.0–15.0)
MCH: 29.5 pg (ref 26.0–34.0)
MCH: 29.7 pg (ref 26.0–34.0)
MCHC: 32.8 g/dL (ref 30.0–36.0)
MCHC: 33.8 g/dL (ref 30.0–36.0)
MCV: 90 fL (ref 78.0–100.0)
MCV: 87.8 fL (ref 78.0–100.0)
Platelets: 308 10*3/uL (ref 150–400)
Platelets: 323 10*3/uL (ref 150–400)
RBC: 2.1 MIL/uL — ABNORMAL LOW (ref 3.87–5.11)
RBC: 3.7 MIL/uL — ABNORMAL LOW (ref 3.87–5.11)
RDW: 21.7 % — AB (ref 11.5–15.5)
RDW: 17.2 % — AB (ref 11.5–15.5)
WBC: 14.7 10*3/uL — AB (ref 4.0–10.5)
WBC: 20.4 10*3/uL — ABNORMAL HIGH (ref 4.0–10.5)

## 2018-02-03 LAB — CREATININE, SERUM
Creatinine, Ser: 0.62 mg/dL (ref 0.44–1.00)
GFR calc Af Amer: >60 mL/min (ref >60)
GFR calc non Af Amer: >60 mL/min (ref >60)

## 2018-02-03 LAB — BASIC METABOLIC PANEL
ANION GAP: 8 (ref 5–15)
BUN: 13 mg/dL (ref 6–20)
CALCIUM: 7.9 mg/dL — AB (ref 8.9–10.3)
CHLORIDE: 114 mmol/L — AB (ref 101–111)
CO2: 20 mmol/L — AB (ref 22–32)
Creatinine, Ser: 0.64 mg/dL (ref 0.44–1.00)
GFR calc Af Amer: >60 mL/min (ref >60)
GFR calc non Af Amer: >60 mL/min (ref >60)
GLUCOSE: 113 mg/dL — AB (ref 65–99)
Potassium: 3.4 mmol/L — ABNORMAL LOW (ref 3.5–5.1)
Sodium: 142 mmol/L (ref 135–145)

## 2018-02-03 LAB — LACTIC ACID, PLASMA: Lactic Acid, Venous: 1.1 mmol/L (ref 0.5–1.9)

## 2018-02-03 LAB — PREPARE RBC (CROSSMATCH)

## 2018-02-03 LAB — ABO/RH: ABO/RH(D): A POS

## 2018-02-03 LAB — HIV ANTIBODY (ROUTINE TESTING W REFLEX): HIV Screen 4th Generation wRfx: NONREACTIVE

## 2018-02-03 MED ORDER — POTASSIUM CHLORIDE CRYS ER 20 MEQ PO TBCR
40.0000 meq | EXTENDED_RELEASE_TABLET | Freq: Once | ORAL | Status: AC
  Administered 2018-02-03: 40 meq via ORAL
  Filled 2018-02-03: qty 2

## 2018-02-03 MED ORDER — MORPHINE SULFATE (PF) 4 MG/ML IV SOLN
2.0000 mg | INTRAVENOUS | Status: DC | PRN
  Administered 2018-02-04: 2 mg via INTRAVENOUS
  Administered 2018-02-04 (×2): 3 mg via INTRAVENOUS
  Administered 2018-02-04: 2 mg via INTRAVENOUS
  Administered 2018-02-13 – 2018-02-18 (×17): 4 mg via INTRAVENOUS
  Administered 2018-02-18: 2 mg via INTRAVENOUS
  Administered 2018-02-18: 4 mg via INTRAVENOUS
  Administered 2018-02-19: 2 mg via INTRAVENOUS
  Administered 2018-02-19 – 2018-02-25 (×24): 4 mg via INTRAVENOUS
  Administered 2018-02-25: 2 mg via INTRAVENOUS
  Filled 2018-02-03 (×51): qty 1

## 2018-02-03 MED ORDER — FUROSEMIDE 10 MG/ML IJ SOLN
20.0000 mg | Freq: Once | INTRAMUSCULAR | Status: AC
  Administered 2018-02-03: 20 mg via INTRAVENOUS
  Filled 2018-02-03: qty 2

## 2018-02-03 MED ORDER — ENOXAPARIN SODIUM 30 MG/0.3ML ~~LOC~~ SOLN
30.0000 mg | SUBCUTANEOUS | Status: DC
  Administered 2018-02-03 – 2018-03-01 (×27): 30 mg via SUBCUTANEOUS
  Filled 2018-02-03 (×28): qty 0.3

## 2018-02-03 MED ORDER — ENSURE ENLIVE PO LIQD
237.0000 mL | Freq: Two times a day (BID) | ORAL | Status: DC
  Administered 2018-02-03 – 2018-02-24 (×11): 237 mL via ORAL

## 2018-02-03 MED ORDER — SODIUM CHLORIDE 0.9 % IV SOLN: Freq: Once | INTRAVENOUS | Status: DC

## 2018-02-03 MED ORDER — MORPHINE SULFATE (PF) 4 MG/ML IV SOLN
2.0000 mg | Freq: Once | INTRAVENOUS | Status: AC
  Administered 2018-02-03: 2 mg via INTRAVENOUS
  Filled 2018-02-03: qty 1

## 2018-02-03 MED ORDER — ADULT MULTIVITAMIN W/MINERALS CH
1.0000 | ORAL_TABLET | Freq: Every day | ORAL | Status: DC
  Administered 2018-02-03 – 2018-03-01 (×27): 1 via ORAL
  Filled 2018-02-03 (×28): qty 1

## 2018-02-03 MED ORDER — IBUPROFEN 200 MG PO TABS
400.0000 mg | ORAL_TABLET | Freq: Four times a day (QID) | ORAL | Status: DC | PRN
  Administered 2018-02-03 – 2018-02-17 (×8): 400 mg via ORAL
  Filled 2018-02-03 (×11): qty 2

## 2018-02-03 NOTE — Consult Note (Addendum)
[facility] Nurse wound consult note Reason for Consult: Consult requested for bilat stump wounds.  Pt is familiar to [facility] team from multiple previous admissions. 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  Pt had bilat amputations performed last summer for necrotic leg wounds.  She admits that she is still using cocaine and now has necrotic wounds to bilat stumps. These wounds are very painful and will not heal while patient is continuing cocaine use; discussed with her and she states she is aware this is the case.  Topical treatment is directed towards minimizing occurrence of pain and bleeding with dressing changes, and preventing further injury to the sites.   Wound type: Multiple full thickness wounds; 100% tightly adhered eschar. No odor or fluctuance, small amt bloody drainage. Measurement: Left knee 4X5cm Left stump 8X8cm Right lower thigh 4X4cm Right knee 4X7cm Right stump 10X9cm Dressing procedure/placement/frequency: Xeroform gauze to minimize adherence to wound, ABD pads and kerlex to protect from further injury.  Discussed plan of care with patient and she verbalized understanding. If aggressive plan of care is desired, please consult ortho team for further plan of care. Topical treatment orders provided for staff nurses to perform daily. Please re-consult if further assistance is needed.  Thank-you,  Julien Girt MSN, [place], [place], [place], [place]

## 2018-02-03 NOTE — Consult Note (Signed)
ORTHOPAEDIC CONSULTATION  REQUESTING PHYSICIAN: Doreatha Lew, MD  Chief Complaint: Painful gangrene bilateral transtibial amputations.  HPI: [name] is a 57 y.o. female who presents with bilateral transtibial amputations with severe peripheral vascular disease.  Patient has had rapid progression of the necrosis for both lower extremities including necrotic ulcers proximal to the patella.  Past Medical History:  Diagnosis Date  . CAP (community acquired pneumonia) 03/2014 X 2  . Cocaine abuse ([place])    ongoing with resultant vaculitis.  . Depression   . Headache    "weekly" (07/29/2016)  . Hypertension   . Inflammatory arthritis   . Migraines    "probably 5-6/yr" (07/29/2016)  . Normocytic anemia    BL Hgb 9.8-12. Last anemia panel 04/2010 - showing Fe 19, ferritin 101.  Pt on monthly B12 injections  . Rheumatoid arthritis(714.0)    patient reported  . 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 Followed by Dr. Louanne Skye   Past Surgical History:  Procedure Laterality Date  . AMPUTATION Left 05/22/2016   Procedure: AMPUTATION LEFT LONG FINGER;  Surgeon: Marybelle Killings, MD;  Location: [facility];  Service: Orthopedics;  Laterality: Left;  . AMPUTATION Bilateral 04/10/2017   Procedure: AMPUTATION BELOW KNEE;  Surgeon: Newt Minion, MD;  Location: [facility];  Service: Orthopedics;  Laterality: Bilateral;  . HERNIA REPAIR     "stomach"  . I&D EXTREMITY Right 09/26/2015   Procedure: IRRIGATION AND DEBRIDEMENT LEG WOUND  VAC PLACEMENT.;  Surgeon: Loel Lofty Dillingham, DO;  Location: [facility];  Service: Plastics;  Laterality: Right;  . INCISION AND DRAINAGE OF WOUND Bilateral 10/20/2016   Procedure: IRRIGATION AND DEBRIDEMENT WOUND BILATERAL;  Surgeon: Edrick Kins, DPM;  Location: [facility];  Service: Podiatry;  Laterality: Bilateral;  . IRRIGATION AND DEBRIDEMENT ABSCESS Bilateral 09/26/2013   Procedure: DEBRIDEMENT ULCERS BILATERAL  THIGHS;  Surgeon: Gwenyth Ober, MD;  Location: [facility];  Service: General;  Laterality: Bilateral;  . SKIN BIOPSY Bilateral    shin nodules   Social History   Socioeconomic History  . Marital status: Single    Spouse name: Not on file  . Number of children: 0  . Years of education: 11th grade  . Highest education level: Not on file  Occupational History  . Occupation: Disability    Comment: since 2011, due to her rheumatoid arthritis  Social Needs  . Financial resource strain: Not on file  . Food insecurity:    Worry: Not on file    Inability: Not on file  . Transportation needs:    Medical: Not on file    Non-medical: Not on file  Tobacco Use  . Smoking status: Current Every Day Smoker    Packs/day: 0.12    Years: 38.00    Pack years: 4.56    Types: Cigarettes  . Smokeless tobacco: Never Used  . Tobacco comment: 2 A DAY  Substance and Sexual Activity  . Alcohol use: No    Alcohol/week: 0.0 oz  . Drug use: Yes    Types: "Crack" cocaine, Cocaine    Comment: Smoked crack 06/02/2017  . Sexual activity: Not Currently  Lifestyle  . Physical activity:    Days per week: Not on file    Minutes per session: Not on file  . Stress: Not on file  Relationships  . Social connections:    Talks on phone: Not on file    Gets together:  Not on file    Attends religious service: Not on file    Active member of club or organization: Not on file    Attends meetings of clubs or organizations: Not on file    Relationship status: Not on file  Other Topics Concern  . Not on file  Social History Narrative   Unemployed:  cleaning in past   Living at [place]; has Medicaid.   crack/cocaine use; pt denies IVDU   tobacco:  1/2 ppd, trying to quit   alcohol:  none       Family History  Problem Relation Age of Onset  . Breast cancer Mother        Breast cancer  . Alcohol abuse Mother   . Colon cancer Maternal Aunt 22  . Alcohol abuse Father    - negative except otherwise  stated in the family history section Allergies  Allergen Reactions  . Acetaminophen Swelling and Other (See Comments)    Reaction:  Eyelid swelling  . Lisinopril Other (See Comments)    Unk   Prior to Admission medications   Medication Sig Start Date End Date Taking? Authorizing Provider  doxycycline (VIBRAMYCIN) 100 MG capsule Take 1 capsule (100 mg total) by mouth 2 (two) times daily. 01/23/18  Yes Recardo Evangelist, PA-C  HYDROcodone-ibuprofen (VICOPROFEN) 7.5-200 MG tablet Take 1 tablet by mouth every 6 (six) hours as needed for moderate pain. 01/23/18  Yes Recardo Evangelist, PA-C  ibuprofen (ADVIL,MOTRIN) 200 MG tablet Take 200 mg by mouth daily as needed for moderate pain.   Yes [provider]  naproxen sodium (ALEVE) 220 MG tablet Take 220 mg by mouth daily as needed (pain).   Yes [provider]  amLODipine (NORVASC) 10 MG tablet Take 1 tablet (10 mg total) by mouth daily. Patient not taking: Reported on 02/02/2018 09/14/17   Frederica Kuster, PA-C  busPIRone (BUSPAR) 15 MG tablet Take 1 tablet (15 mg total) by mouth 3 (three) times daily. Patient not taking: Reported on 02/02/2018 07/26/17   Scot Jun, FNP  cyclobenzaprine (FLEXERIL) 5 MG tablet Take 1 tablet (5 mg total) by mouth 3 (three) times daily as needed (muscle soreness). Patient not taking: Reported on 02/02/2018 07/04/17   Rolland Porter, MD  escitalopram (LEXAPRO) 10 MG tablet Take 2 tablets (20 mg total) by mouth at bedtime. Patient not taking: Reported on 02/02/2018 07/26/17   Scot Jun, FNP  gabapentin (NEURONTIN) 600 MG tablet Take 1 tablet (600 mg total) by mouth 3 (three) times daily. Patient not taking: Reported on 02/02/2018 07/26/17   Scot Jun, FNP  hydrochlorothiazide (HYDRODIURIL) 25 MG tablet Take 1 tablet (25 mg total) daily by mouth. Patient not taking: Reported on 02/02/2018 08/11/17   Dorie Rank, MD  potassium chloride (K-DUR) 10 MEQ tablet Take 1 tablet (10 mEq total) daily  by mouth. Patient not taking: Reported on 02/02/2018 08/11/17   Dorie Rank, MD   Dg Chest 2 View  Addendum Date: 02/02/2018   ADDENDUM REPORT: 02/02/2018 14:33 ADDENDUM: Acute findings discussed with and reconfirmed by Dr.KEVIN CAMPOS on 02/02/2018 at 2:32 pm. Electronically Signed   By: Elon Alas M.D.   On: 02/02/2018 14:33   Result Date: 02/02/2018 CLINICAL DATA:  Mid chest pain, cough and congestion. EXAM: CHEST - 2 VIEW COMPARISON:  Chest radiograph January 20, 2018 FINDINGS: Multifocal RIGHT lung consolidation. Mild chronic interstitial changes without pleural effusion. Borderline cardiomegaly. Calcified aortic knob. No pneumothorax. Soft tissue planes included  osseous structures are nonsuspicious. IMPRESSION: 1. Multifocal RIGHT pneumonia. 2. Borderline cardiomegaly. 3.  Aortic Atherosclerosis (ICD10-I70.0). Electronically Signed: By: Elon Alas M.D. On: 02/02/2018 14:28   Dg Knee 1-2 Views Left  Result Date: 02/02/2018 CLINICAL DATA:  Knee pain.  Status post BKA. EXAM: LEFT KNEE - 1-2 VIEW COMPARISON:  01/20/2018 FINDINGS: Bones are demineralized. No evidence for an acute fracture. No subluxation or dislocation. No worrisome lytic or sclerotic osseous abnormality. No gross joint effusion evident. There is some soft tissue irregularity at the stump. IMPRESSION: Osteopenia without acute bony abnormality. Soft tissue irregularity at the stump may be postoperative scarring although wound/ulcer could have this appearance. Electronically Signed   By: Misty Stanley M.D.   On: 02/02/2018 14:30   Dg Knee 1-2 Views Right  Result Date: 02/02/2018 CLINICAL DATA:  Pain and necrosis at stump, bleeding. EXAM: RIGHT KNEE - 1-2 VIEW COMPARISON:  RIGHT knee radiograph January 20, 2018 FINDINGS: Status post below-knee amputation. No acute osseous process. No rib fracture deformity or dislocation. No advanced degenerative change for age. Increasing soft tissue swelling with superficial irregularity and  subcutaneous gas about the stump. IMPRESSION: Soft tissue swelling with new subcutaneous gas seen with necrotizing fasciitis or recent instrumentation. No acute osseous process. Acute findings discussed with and reconfirmed by Dr.KEVIN CAMPOS on 02/02/2018 at 2:32 pm. Electronically Signed   By: Elon Alas M.D.   On: 02/02/2018 14:32   - pertinent xrays, CT, MRI studies were reviewed and independently interpreted  Positive ROS: All other systems have been reviewed and were otherwise negative with the exception of those mentioned in the HPI and as above.  Physical Exam: General: Alert, no acute distress Psychiatric: Patient is competent for consent with normal mood and affect Lymphatic: No axillary or cervical lymphadenopathy Cardiovascular: No pedal edema Respiratory: No cyanosis, no use of accessory musculature GI: No organomegaly, abdomen is soft and non-tender    Images:  @ENCIMAGES @  Labs:  Lab Results  Component Value Date   HGBA1C 5.5 03/30/2017   HGBA1C 5.1 04/21/2015   HGBA1C 5.8 (H) 02/13/2012   ESRSEDRATE 104 (H) 04/08/2017   ESRSEDRATE 67 (H) 12/23/2016   ESRSEDRATE 65 (H) 09/09/2016   CRP 2.0 (H) 04/08/2017   CRP 5.1 (H) 12/23/2016   CRP 14.4 (H) 04/27/2016   LABURIC 3.7 10/29/2010   LABURIC 3.4 10/22/2010   LABURIC 2.9 05/31/2009   REPTSTATUS PENDING 02/02/2018   REPTSTATUS PENDING 02/02/2018   GRAMSTAIN  10/23/2017    MODERATE WBC PRESENT, PREDOMINANTLY PMN MODERATE GRAM POSITIVE COCCI IN PAIRS IN CLUSTERS RARE GRAM NEGATIVE COCCOBACILLI    CULT  02/02/2018    NO GROWTH < 24 HOURS Performed at [facility], [place] [address], [place], [place] [address]    CULT  02/02/2018    NO GROWTH < 24 HOURS Performed at [facility] [address] [place], [place] [address]    [place] 10/23/2017     Neurologic: Patient does not have protective sensation bilateral lower  extremities.   MUSCULOSKELETAL:   Skin: Examination patient has full-thickness gangrene of both residual limb transtibial amputations below the patella.  There are also ischemic necrotic ulcers proximal to the patella.  There is no ascending cellulitis no signs of abscess.  The legs are thin and atrophic.  Dry gangrene.  Patient is alert oriented she has no complaints of pain in her thighs.  Assessment: Assessment: Severe peripheral vascular disease with dry gangrene of bilateral transtibial amputations  with ischemic ulcers extending proximal to the patella.  Plan: Plan: Discussed with the patient that her only option would be an above-the-knee amputation bilaterally.  Patient states that she understands the risks and benefits of surgery there is a high risk that even above-the-knee amputation would not heal.  Patient states she understands wished to proceed at this time.  Please have patient transferred to [facility] hospital when she is stabilized and I could proceed with surgery for bilateral above-the-knee amputations next week possibly Wednesday or Friday.  Thank you for the consult and the opportunity to see Ms. Burns [place], MD [place] [phone] 6:45 PM

## 2018-02-03 NOTE — Consult Note (Addendum)
[facility] Consult   Reason for Consult:  SI Referring Physician:  Dr. Patrecia Pour Patient Identification: [name] MRN:  [id number] Principal Diagnosis: Cocaine use disorder, severe, dependence ([place]) Diagnosis:   Patient Active Problem List   Diagnosis Date Noted  . Suicidal ideation [R45.851] 02/02/2018  . HCAP (healthcare-associated pneumonia) [J18.9] 02/02/2018  . Acute on chronic respiratory failure with hypoxia ([place]) [J96.21] 02/02/2018  . Sepsis ([place]) [A41.9] 02/02/2018  . Ulcer of amputation stump of lower extremity ([place]) [T87.89, L97.909] 10/23/2017  . 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 Hypertensive crisis [I16.9]   . Phantom limb pain ([place]) [G54.6]   . S/P bilateral below knee amputation ([place]) [U76.546, Z89.511] 04/13/2017  . Tobacco abuse [Z72.0]   . Post-operative pain [G89.18]   . Acute blood loss anemia [D62]   . Atherosclerosis of native arteries of extremities with gangrene, left leg (HCC) [I70.262]   . Wound infection [T14.8XXA, L08.9] 03/30/2017  . AKI (acute kidney injury) ([place]) [N17.9] 02/07/2017  . Acute kidney injury ([place]) [N17.9] 02/06/2017  . Wound healing, delayed [T14.8XXD]   . Chest pain [R07.9] 04/07/2015  . Protein-calorie malnutrition, severe ([place]) [E43] 01/15/2015  . MDD (major depressive disorder), recurrent episode, severe ([place]) [F33.2] 10/16/2014  . Cocaine use disorder, severe, dependence ([place]) [F14.20] 10/10/2014  . Cocaine-induced vascular disorder ([place]) [T03.546, I99.9] 06/19/2013  . Essential hypertension [I10] 02/26/2010    Total Time spent with patient: 1 hour  Subjective:   [name] is a 57 y.o. female patient admitted with pneumonia.  HPI:   Per chart review, patient was admitted with acute on chronic respiratory failure with hypoxia secondary to HCAP. 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 She reports ongoing bilateral leg pain. She has a history of MDD and cocaine abuse.  She was last seen on 08/17/2017 at [facility] for depression with SI in the setting of family stresors and leg pain following right BKA.  She was psychiatrically cleared after improving in the ED for 2 days. UDS was positive for opiates, cocaine and THC on 4/18.   On interview, [name] denies SI today. She reports feeling suicidal yesterday due to ongoing pain. She reports an improvement in her pain today. She denies HI or AVH. She denies problems with sleep or appetite. She denies problems with mood or anxiety at this time. She requests assistance with substance abuse treatment. She understands that she must stop using cocaine in order for her wounds to properly heal.   Past Psychiatric History: MDD and cocaine abuse.   Risk to Self: None. Denies SI.  Risk to Others:  None. Denies HI.  Prior Inpatient Therapy:  Denies  Prior Outpatient Therapy:  Denies   Past Medical History:  Past Medical History:  Diagnosis Date  . CAP (community acquired pneumonia) 03/2014 X 2  . Cocaine abuse ([place])    ongoing with resultant vaculitis.  . Depression   . Headache    "weekly" (07/29/2016)  . Hypertension   . Inflammatory arthritis   . Migraines    "probably 5-6/yr" (07/29/2016)  . Normocytic anemia    BL Hgb 9.8-12. Last anemia panel 04/2010 - showing Fe 19, ferritin 101.  Pt on monthly B12 injections  . Rheumatoid arthritis(714.0)    patient reported  . 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 Followed by Dr. Louanne Skye    Past Surgical History:  Procedure Laterality Date  . AMPUTATION Left 05/22/2016   Procedure: AMPUTATION LEFT LONG FINGER;  Surgeon: Marybelle Killings, MD;  Location: [facility];  Service: Orthopedics;  Laterality: Left;  . AMPUTATION Bilateral 04/10/2017   Procedure: AMPUTATION BELOW KNEE;  Surgeon: Newt Minion, MD;   Location: [facility];  Service: Orthopedics;  Laterality: Bilateral;  . HERNIA REPAIR     "stomach"  . I&D EXTREMITY Right 09/26/2015   Procedure: IRRIGATION AND DEBRIDEMENT LEG WOUND  VAC PLACEMENT.;  Surgeon: Loel Lofty Dillingham, DO;  Location: [facility];  Service: Plastics;  Laterality: Right;  . INCISION AND DRAINAGE OF WOUND Bilateral 10/20/2016   Procedure: IRRIGATION AND DEBRIDEMENT WOUND BILATERAL;  Surgeon: Edrick Kins, DPM;  Location: [facility];  Service: Podiatry;  Laterality: Bilateral;  . IRRIGATION AND DEBRIDEMENT ABSCESS Bilateral 09/26/2013   Procedure: DEBRIDEMENT ULCERS BILATERAL THIGHS;  Surgeon: Gwenyth Ober, MD;  Location: [facility];  Service: General;  Laterality: Bilateral;  . SKIN BIOPSY Bilateral    shin nodules   Family History:  Family History  Problem Relation Age of Onset  . Breast cancer Mother        Breast cancer  . Alcohol abuse Mother   . Colon cancer Maternal Aunt 83  . Alcohol abuse Father    Family Psychiatric  History: Alcohol abuse-father and mother.  Social History:  Social History   Substance and Sexual Activity  Alcohol Use No  . Alcohol/week: 0.0 oz     Social History   Substance and Sexual Activity  Drug Use Yes  . Types: "Crack" cocaine, Cocaine   Comment: Smoked crack 06/02/2017    Social History   Socioeconomic History  . Marital status: Single    Spouse name: Not on file  . Number of children: 0  . Years of education: 11th grade  . Highest education level: Not on file  Occupational History  . Occupation: Disability    Comment: since 2011, due to her rheumatoid arthritis  Social Needs  . Financial resource strain: Not on file  . Food insecurity:    Worry: Not on file    Inability: Not on file  . Transportation needs:    Medical: Not on file    Non-medical: Not on file  Tobacco Use  . Smoking status: Current Every Day Smoker    Packs/day: 0.12    Years: 38.00    Pack years: 4.56    Types: Cigarettes  . Smokeless tobacco:  Never Used  . Tobacco comment: 2 A DAY  Substance and Sexual Activity  . Alcohol use: No    Alcohol/week: 0.0 oz  . Drug use: Yes    Types: "Crack" cocaine, Cocaine    Comment: Smoked crack 06/02/2017  . Sexual activity: Not Currently  Lifestyle  . Physical activity:    Days per week: Not on file    Minutes per session: Not on file  . Stress: Not on file  Relationships  . Social connections:    Talks on phone: Not on file    Gets together: Not on file    Attends religious service: Not on file    Active member of club or organization: Not on file    Attends meetings of clubs or organizations: Not on file    Relationship status: Not on file  Other Topics Concern  . Not on file  Social History Narrative   Unemployed:  cleaning  in past   Living at [place]; has Medicaid.   crack/cocaine use; pt denies IVDU   tobacco:  1/2 ppd, trying to quit   alcohol:  none       Additional Social History: She lives in a home with 4 roommates. She binges on cocaine every weekend. She completed rehab several years ago for cocaine use. Her longest period of sobriety was 6 months. She denies alcohol or other illicit substance use.      Allergies:   Allergies  Allergen Reactions  . Acetaminophen Swelling and Other (See Comments)    Reaction:  Eyelid swelling  . Lisinopril Other (See Comments)    Unk    Labs:  Results for orders placed or performed during the hospital encounter of 02/02/18 (from the past 48 hour(s))  CBC     Status: Abnormal   Collection Time: 02/02/18  1:24 PM  Result Value Ref Range   WBC 19.1 (H) 4.0 - 10.5 K/uL   RBC 2.96 (L) 3.87 - 5.11 MIL/uL   Hemoglobin 8.3 (L) 12.0 - 15.0 g/dL   HCT 25.5 (L) 36.0 - 46.0 %   MCV 86.1 78.0 - 100.0 fL   MCH 28.0 26.0 - 34.0 pg   MCHC 32.5 30.0 - 36.0 g/dL   RDW 21.0 (H) 11.5 - 15.5 %   Platelets 507 (H) 150 - 400 K/uL    Comment: Performed at [facility], [place] [address], [place], [place] [address]   Comprehensive metabolic panel     Status: Abnormal   Collection Time: 02/02/18  1:24 PM  Result Value Ref Range   Sodium 143 135 - 145 mmol/L   Potassium 3.3 (L) 3.5 - 5.1 mmol/L   Chloride 109 101 - 111 mmol/L   CO2 22 22 - 32 mmol/L   Glucose, Bld 113 (H) 65 - 99 mg/dL   BUN 12 6 - 20 mg/dL   Creatinine, Ser 0.66 0.44 - 1.00 mg/dL   Calcium 9.1 8.9 - 10.3 mg/dL   Total Protein 7.5 6.5 - 8.1 g/dL   Albumin 3.3 (L) 3.5 - 5.0 g/dL   AST 26 15 - 41 U/L   ALT 10 (L) 14 - 54 U/L   Alkaline Phosphatase 82 38 - 126 U/L   Total Bilirubin 0.9 0.3 - 1.2 mg/dL   GFR calc non Af Amer >60 >60 mL/min   GFR calc Af Amer >60 >60 mL/min    Comment: (NOTE) The eGFR has been calculated using the CKD EPI equation. This calculation has not been validated in all clinical situations. eGFR's persistently <60 mL/min signify possible Chronic Kidney Disease.    Anion gap 12 5 - 15    Comment: Performed at [facility], [place] [address] [place], [place] [address]  Troponin I     Status: Abnormal   Collection Time: 02/02/18  1:24 PM  Result Value Ref Range   Troponin I 0.09 (HH) <0.03 ng/mL    Comment: CRITICAL RESULT CALLED TO, READ BACK BY AND VERIFIED WITH: SMITH,T. RN AT 2505 02/02/18 MULLINS,T  Performed at [facility], [place] [address], [place], [place] [address]   I-Stat beta hCG blood, ED     Status: Abnormal   Collection Time: 02/02/18  1:40 PM  Result Value Ref Range   I-stat hCG, quantitative 8.5 (H) <5 mIU/mL   Comment 3            Comment:   GEST. AGE  CONC.  (mIU/mL)   <=1 WEEK        5 - 50     2 WEEKS       50 - 500     3 WEEKS       100 - 10,000     4 WEEKS     1,000 - 30,000        FEMALE AND NON-PREGNANT FEMALE:     LESS THAN 5 mIU/mL   I-Stat CG4 Lactic Acid, ED     Status: Abnormal   Collection Time: 02/02/18  3:05 PM  Result Value Ref Range   Lactic Acid, Venous 2.53 (HH) 0.5 - 1.9 mmol/L   Comment NOTIFIED PHYSICIAN   MRSA PCR  Screening     Status: None   Collection Time: 02/02/18  4:38 PM  Result Value Ref Range   MRSA by PCR NEGATIVE NEGATIVE    Comment:        The GeneXpert MRSA Assay (FDA approved for NASAL specimens only), is one component of a comprehensive MRSA colonization surveillance program. It is not intended to diagnose MRSA infection nor to guide or monitor treatment for MRSA infections. Performed at [facility], [place] [address] [place], [place] [address]   Lactic acid, plasma     Status: None   Collection Time: 02/02/18  8:41 PM  Result Value Ref Range   Lactic Acid, Venous 1.5 0.5 - 1.9 mmol/L    Comment: Performed at [facility], [place] [address] [place], [place] [address]  Procalcitonin     Status: None   Collection Time: 02/02/18  8:41 PM  Result Value Ref Range   Procalcitonin 0.17 ng/mL    Comment:        Interpretation: PCT (Procalcitonin) <= 0.5 ng/mL: Systemic infection (sepsis) is not likely. Local bacterial infection is possible. (NOTE)       Sepsis PCT Algorithm           Lower Respiratory Tract                                      Infection PCT Algorithm    ----------------------------     ----------------------------         PCT < 0.25 ng/mL                PCT < 0.10 ng/mL         Strongly encourage             Strongly discourage   discontinuation of antibiotics    initiation of antibiotics    ----------------------------     -----------------------------       PCT 0.25 - 0.50 ng/mL            PCT 0.10 - 0.25 ng/mL               OR       >80% decrease in PCT            Discourage initiation of                                            antibiotics      Encourage discontinuation           of antibiotics    ----------------------------     -----------------------------  PCT >= 0.50 ng/mL              PCT 0.26 - 0.50 ng/mL               AND        <80% decrease in PCT             Encourage initiation of                                              antibiotics       Encourage continuation           of antibiotics    ----------------------------     -----------------------------        PCT >= 0.50 ng/mL                  PCT > 0.50 ng/mL               AND         increase in PCT                  Strongly encourage                                      initiation of antibiotics    Strongly encourage escalation           of antibiotics                                     -----------------------------                                           PCT <= 0.25 ng/mL                                                 OR                                        > 80% decrease in PCT                                     Discontinue / Do not initiate                                             antibiotics Performed at [place] [address], [place], [place] [address]   Protime-INR     Status: Abnormal   Collection Time: 02/02/18  8:41 PM  Result Value Ref Range   Prothrombin Time 15.9 (H) 11.4 - 15.2 seconds   INR 1.28     Comment: Performed at [facility], [place] [address] [place],  [place] [address]  APTT     Status: None   Collection Time: 02/02/18  8:41 PM  Result Value Ref Range   aPTT 29 24 - 36 seconds    Comment: Performed at [facility], [place] [address] [place], [place] [address]  Creatinine, serum     Status: None   Collection Time: 02/02/18  8:41 PM  Result Value Ref Range   Creatinine, Ser 0.62 0.44 - 1.00 mg/dL   GFR calc non Af Amer >60 >60 mL/min   GFR calc Af Amer >60 >60 mL/min    Comment: (NOTE) The eGFR has been calculated using the CKD EPI equation. This calculation has not been validated in all clinical situations. eGFR's persistently <60 mL/min signify possible Chronic Kidney Disease. Performed at [facility], [place] [address], [place], [place] [address]   Lactic acid, plasma     Status: None   Collection  Time: 02/03/18 12:13 AM  Result Value Ref Range   Lactic Acid, Venous 1.1 0.5 - 1.9 mmol/L    Comment: Performed at [facility], [place] [address] [place], [place] [address]  CBC     Status: Abnormal   Collection Time: 02/03/18  6:30 AM  Result Value Ref Range   WBC 14.7 (H) 4.0 - 10.5 K/uL   RBC 2.10 (L) 3.87 - 5.11 MIL/uL   Hemoglobin 6.2 (LL) 12.0 - 15.0 g/dL    Comment: REPEATED TO VERIFY DELTA CHECK NOTED CRITICAL RESULT CALLED TO, READ BACK BY AND VERIFIED WITH: FRASIER, H. RN _0  ON 5.2.19 BY NMCCOY    HCT 18.9 (L) 36.0 - 46.0 %   MCV 90.0 78.0 - 100.0 fL   MCH 29.5 26.0 - 34.0 pg   MCHC 32.8 30.0 - 36.0 g/dL   RDW 21.7 (H) 11.5 - 15.5 %   Platelets 308 150 - 400 K/uL    Comment: Performed at [facility], [place] [address], [place], [place] [address]  Basic metabolic panel     Status: Abnormal   Collection Time: 02/03/18  6:30 AM  Result Value Ref Range   Sodium 142 135 - 145 mmol/L   Potassium 3.4 (L) 3.5 - 5.1 mmol/L   Chloride 114 (H) 101 - 111 mmol/L   CO2 20 (L) 22 - 32 mmol/L   Glucose, Bld 113 (H) 65 - 99 mg/dL   BUN 13 6 - 20 mg/dL   Creatinine, Ser 0.64 0.44 - 1.00 mg/dL   Calcium 7.9 (L) 8.9 - 10.3 mg/dL   GFR calc non Af Amer >60 >60 mL/min   GFR calc Af Amer >60 >60 mL/min    Comment: (NOTE) The eGFR has been calculated using the CKD EPI equation. This calculation has not been validated in all clinical situations. eGFR's persistently <60 mL/min signify possible Chronic Kidney Disease.    Anion gap 8 5 - 15    Comment: Performed at [facility], [place] [address] [place],  [address]  Type and screen [place]     Status: None (Preliminary result)   Collection Time: 02/03/18  7:25 AM  Result Value Ref Range   ABO/RH(D) A POS    Antibody Screen NEG    Sample Expiration 02/06/2018    Unit Number [id number]    Blood Component Type RED CELLS,LR    Unit division 00     Status of Unit ISSUED    Transfusion Status OK TO TRANSFUSE    Crossmatch Result  Compatible Performed at [facility], [place] [address] [place], [place] [address]    Unit Number [id number]    Blood Component Type RED CELLS,LR    Unit division 00    Status of Unit ALLOCATED    Transfusion Status OK TO TRANSFUSE    Crossmatch Result Compatible   Prepare RBC     Status: None   Collection Time: 02/03/18  7:25 AM  Result Value Ref Range   Order Confirmation      ORDER PROCESSED BY BLOOD BANK Performed at [facility], [place] [address], [place], [place] [address]   ABO/Rh     Status: None   Collection Time: 02/03/18  7:25 AM  Result Value Ref Range   ABO/RH(D)      A POS Performed at [facility], [place] [address] [place], [place] [address]     Current Facility-Administered Medications  Medication Dose Route Frequency Provider Last Rate Last Dose  . 0.9 %  sodium chloride infusion   Intravenous Continuous Annita Brod, MD   Stopped at 02/03/18 [phone]  . 0.9 %  sodium chloride infusion   Intravenous Once Patrecia Pour, Christean Grief, MD      . alum & mag hydroxide-simeth (MAALOX/MYLANTA) 200-200-20 MG/5ML suspension 30 mL  30 mL Oral Q6H PRN Jola Schmidt, MD   30 mL at 02/02/18 1418  . amLODipine (NORVASC) tablet 10 mg  10 mg Oral Daily Annita Brod, MD   10 mg at 02/03/18 0912  . busPIRone (BUSPAR) tablet 15 mg  15 mg Oral TID Annita Brod, MD   15 mg at 02/03/18 0912  . ceFEPIme (MAXIPIME) 1 g in sodium chloride 0.9 % 100 mL IVPB  1 g Intravenous Q8H Wofford, Drew A, RPH 200 mL/hr at 02/03/18 0518 1 g at 02/03/18 0518  . enoxaparin (LOVENOX) injection 30 mg  30 mg Subcutaneous Q24H Green, Terri L, RPH      . escitalopram (LEXAPRO) tablet 20 mg  20 mg Oral QHS Annita Brod, MD   20 mg at 02/02/18 2052  . furosemide (LASIX) injection 20 mg  20 mg Intravenous Once Patrecia Pour, Christean Grief, MD      . furosemide  (LASIX) injection 20 mg  20 mg Intravenous Once Patrecia Pour, Christean Grief, MD      . morphine 4 MG/ML injection 2 mg  2 mg Intravenous Q2H PRN Annita Brod, MD   2 mg at 02/03/18 0254  . ondansetron (ZOFRAN) tablet 4 mg  4 mg Oral Q6H PRN Annita Brod, MD       Or  . ondansetron [facility]) injection 4 mg  4 mg Intravenous Q6H PRN Annita Brod, MD   4 mg at 02/02/18 2052  . polyethylene glycol (MIRALAX / GLYCOLAX) packet 17 g  17 g Oral Daily PRN Annita Brod, MD      . vancomycin (VANCOCIN) IVPB 750 mg/150 ml premix  750 mg Intravenous Q24H Polly Cobia, RPH   Stopped at 02/03/18 1007  . zolpidem (AMBIEN) tablet 5 mg  5 mg Oral QHS PRN Jola Schmidt, MD        Musculoskeletal: Strength & Muscle Tone: within normal limits Gait & Station: unable to stand due to bilateral BKA.  Patient leans: N/A  Psychiatric Specialty Exam: Physical Exam  Nursing note and vitals reviewed. Constitutional: She is oriented to person, place, and time. She appears well-developed.  Thin  HENT:  Head: Normocephalic and atraumatic.  Neck: Normal range of motion.  Respiratory: Effort normal.  Musculoskeletal: Normal range of motion.  Neurological: She is alert and oriented to person, place, and time.  Skin: No rash noted.  Psychiatric: She has a normal mood and affect. Her speech is normal and behavior is normal. Judgment and thought content normal. Cognition and memory are normal.    Review of Systems  Constitutional: Negative for chills and fever.  Cardiovascular: Negative for chest pain.  Gastrointestinal: Positive for nausea. Negative for abdominal pain, diarrhea and vomiting.  Psychiatric/Behavioral: Positive for substance abuse. Negative for depression, hallucinations and suicidal ideas. The patient is not nervous/anxious and does not have insomnia.   All other systems reviewed and are negative.   Blood pressure 119/60, pulse (!) 104, temperature (!) 100.5 F (38.1 C), temperature  source Oral, resp. rate (!) 27, height _0  (1.549 m), weight 36.2 kg (79 lb 12.9 oz), SpO2 97 %.Body mass index is 15.08 kg/m.  General Appearance: Fairly Groomed, middle aged, African American female, wearing a hospital gown with [place] in place and short, shaved hair who is lying in bed. NAD.   Eye Contact:  Good  Speech:  Clear and Coherent and Normal Rate  Volume:  Normal  Mood:  Dysphoric  Affect:  Congruent but attributed to ongoing pain.   Thought Process:  Goal Directed, Linear and Descriptions of Associations: Intact  Orientation:  Full (Time, Place, and Person)  Thought Content:  Logical  Suicidal Thoughts:  No  Homicidal Thoughts:  No  Memory:  Immediate;   Good Recent;   Good Remote;   Good  Judgement:  Fair  Insight:  Fair  Psychomotor Activity:  Normal  Concentration:  Concentration: Good and Attention Span: Good  Recall:  Good  Fund of Knowledge:  Good  Language:  Good  Akathisia:  No  Handed:  Right  AIMS (if indicated):   N/A  Assets:  Communication Skills Desire for Improvement Housing  ADL's:  Impaired  Cognition:  WNL  Sleep:   Okay   Assessment:  [name] is a 57 y.o. female who was admitted with pneumonia. She endorsed SI in the setting of pain. Her pain is better managed today and she denies SI, HI or AVH. She desires assistance with substance abuse and will be referred to SW for resources. She does not warrant inpatient psychiatric hospitalization at this time.   Treatment Plan Summary: -Please have unit SW provide patient with substance abuse treatment resources.  -Patient is psychiatrically cleared. Psychiatry will sign off on patient at this time. Please consult psychiatry again as needed.   Disposition: No evidence of imminent risk to self or others at present.   Patient does not meet criteria for psychiatric inpatient admission.  Faythe Dingwall, DO 02/03/2018 10:24 AM

## 2018-02-03 NOTE — Progress Notes (Signed)
Initial Nutrition Assessment  DOCUMENTATION CODES:   Severe malnutrition in context of social or environmental circumstances, Underweight  INTERVENTION:  - Will order Ensure Enlive po BID, each supplement provides 350 kcal and 20 grams of protein - Will order daily multivitamin with minerals.  - Continue to encourage PO intakes. - RD will continue to monitor for additional nutrition-related needs.   NUTRITION DIAGNOSIS:   Severe Malnutrition related to social / environmental circumstances(cocaine use) as evidenced by severe muscle depletion, severe fat depletion.   GOAL:   Patient will meet greater than or equal to 90% of their needs  MONITOR:   PO intake, Supplement acceptance, Weight trends, Labs, Skin  REASON FOR ASSESSMENT:   Consult Assessment of nutrition requirement/status  ASSESSMENT:   57 y.o. 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 She arrived to the ED complaining of bilateral leg pain and trouble breathing which is been going on now for several days.  Chest x-ray noted signs of right sided pneumonia as well as soft tissue swelling with new subcutaneous gas seen in her right stump on x-ray.  Patient started on broad-spectrum IV antibiotics and fluid bolus. Patient also verbalized suicidal ideation saying that she wanted to kill herself and did not want to live this way although she did not specify a plan.  No intakes documented since admission. Sitter at bedside. Pt reports having bacon and eggs for breakfast. Sitter confirms that pt received these items but that pt did not eat any of her breakfast. She denies abdominal pain or nausea, states that only her legs are bothering her.   At home, pt's appetite is "so so." She does not eat meals, eats 2-3 snacks per day which consist of items such as chips, cookies, and other  items that do not require refrigeration.   NFPE outlined below. Pt requests that legs not be assessed as both are hurting at this time.   Per Dr. Lyman Speller note yesterday evening: pt with sepsis 2/2 HCAP, suspect pt will go into acute on CHF d/t aggressive fluid resuscitation which may require diuresis once sepsis has stabilized.   Medications reviewed; 20 mg IV Lasix x3 today, 40 mEq oral KCl x1 dose today. Labs reviewed; K: 3.4 mmol/L, Cl: 114 mmol/L, Ca: 7.9 mg/dL.  IVF: NS @ 125 mL/hr.      NUTRITION - FOCUSED PHYSICAL EXAM:    Most Recent Value  Orbital Region  Unable to assess  Upper Arm Region  Severe depletion  Thoracic and Lumbar Region  Unable to assess  Buccal Region  Moderate depletion  Temple Region  No depletion  Clavicle Bone Region  Severe depletion  Clavicle and Acromion Bone Region  Severe depletion  Scapular Bone Region  Unable to assess  Dorsal Hand  Mild depletion  Patellar Region  Unable to assess  Anterior Thigh Region  Unable to assess  Posterior Calf Region  Unable to assess  Edema (RD Assessment)  Unable to assess  Hair  Reviewed  Eyes  Reviewed [yellowed]  Mouth  Reviewed  Skin  Reviewed  Nails  Reviewed       Diet Order:   Diet Order           Diet 2 gram sodium Room service appropriate? Yes; Fluid consistency: Thin  Diet effective now          EDUCATION NEEDS:   No  education needs have been identified at this time  Skin:  Skin Assessment: Skin Integrity Issues: Skin Integrity Issues:: Other (Comment) Other: bilateral BKAs, mid sacrum and bilateral ear wounds  Last BM:  5/1  Height:   Ht Readings from Last 1 Encounters:  02/02/18 5' 1"  (1.549 m)    Weight:   Wt Readings from Last 1 Encounters:  02/02/18 79 lb 12.9 oz (36.2 kg)    Ideal Body Weight:  47.27 kg  BMI:  Body mass index is 15.08 kg/m.  Estimated Nutritional Needs:   Kcal:  1265-1450 (35-40 kcal/kg)  Protein:  55-65 grams  Fluid:  >/= 1.5  L/day     Jarome Matin, MS, RD, LDN, [facility] Inpatient Clinical Dietitian Pager # [phone] After hours/weekend pager # [phone]

## 2018-02-03 NOTE — Progress Notes (Signed)
Pre-blood temperature 99.7. Temperature 15 minutes after beginning infusion was 101.6. Blood stopped. MD paged. Awaiting return call. All other VSS. Will continue to monitor pt closely.

## 2018-02-03 NOTE — Progress Notes (Signed)
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  Upon ED evaluation was found to have elevated WBC at 19, elevated lactic acid at 2.5, chest x-ray with signs of right-sided pneumonia.  X-ray of the right stump shows soft tissue swelling and no subcutaneous gas.  Patient was admitted with working diagnosis of pneumonia and possible right stump infection.  While on the ED patient verbalized suicidal ideation and psychiatry has been consulted.  Subjective: Patient seen and examined, she moans and say that she has pain all over her body.  Her hemoglobin dropped today to 6.2.  No signs of overt bleeding.  Assessment & Plan: Acute on chronic respiratory failure with hypoxia due sepsis 2/2 HCAP  I agree with broad-spectrum antibiotics, will continue current regimen. Follow-up blood culture.  Continue oxygen supplementation.  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 Question of acute gas seen on x-ray, wound care recommendations appreciated Dr. Sharol Given consulted, continue antibiotics.  Essential hypertension BP stable Continue current regimen  Anemia of chronic disease Hemoglobin 6.2 today down from 10 about a month ago. No signs of pulmonary bleeding.  Obtain FOBT, transfused 2 units of PRBCs  Acute on chronic diastolic heart failure Secondary to aggressive diuresis Will give 1 dose of Lasix and Lasix between and after transfusion Monitor renal function  Major depressive disorder/suicidal idea Continue sitter at  bedside Psychiatry has been consulted  DVT prophylaxis: Lovenox Code Status: Full code Family Communication: None at bedside Disposition Plan: Keep in SDU for now   Consultants:   Psychiatry  Orthopedic surgery  Procedures:  None  Antimicrobials: Anti-infectives (From admission, onward)   Start     Dose/Rate Route Frequency Ordered Stop   02/03/18 1800  vancomycin (VANCOCIN) 500 mg in sodium chloride 0.9 % 100 mL IVPB  Status:  Discontinued     500 mg 100 mL/hr over 60 Minutes Intravenous Every 24 hours 02/02/18 1847 02/02/18 1858   02/03/18 1000  vancomycin (VANCOCIN) IVPB 750 mg/150 ml premix     750 mg 150 mL/hr over 60 Minutes Intravenous Every 24 hours 02/02/18 1858     02/02/18 2200  ceFEPIme (MAXIPIME) 1 g in sodium chloride 0.9 % 100 mL IVPB  Status:  Discontinued     1 g 200 mL/hr over 30 Minutes Intravenous Every 12 hours 02/02/18 1847 02/02/18 1858   02/02/18 2200  ceFEPIme (MAXIPIME) 1 g in sodium chloride 0.9 % 100 mL IVPB     1 g 200 mL/hr over 30 Minutes Intravenous Every 8 hours 02/02/18 1858     02/02/18 1845  ceFEPIme (MAXIPIME) 2 g in sodium chloride 0.9 % 100 mL IVPB  Status:  Discontinued     2 g 200 mL/hr over 30 Minutes Intravenous  Once 02/02/18 1830 02/02/18 1847   02/02/18 1845  vancomycin (VANCOCIN) IVPB 1000 mg/200 mL premix  Status:  Discontinued     1,000 mg 200 mL/hr over 60 Minutes Intravenous  Once 02/02/18 1830 02/02/18 1847   02/02/18 1500  vancomycin (VANCOCIN) IVPB 1000 mg/200 mL premix     1,000 mg  200 mL/hr over 60 Minutes Intravenous  Once 02/02/18 1445 02/02/18 1647   02/02/18 1500  piperacillin-tazobactam (ZOSYN) IVPB 3.375 g     3.375 g 100 mL/hr over 30 Minutes Intravenous  Once 02/02/18 1445 02/02/18 1546         Objective: Vitals:   02/03/18 0310 02/03/18 0400 02/03/18 0500 02/03/18 0741  BP:  (!) 119/58 125/68   Pulse:  (!) 103 (!) 101   Resp:  (!) 24 (!) 28   Temp: 98.1 F (36.7 C)   98.3 F (36.8 C)  TempSrc:     Oral  SpO2:  93% 92%   Weight:      Height:        Intake/Output Summary (Last 24 hours) at 02/03/2018 0998 Last data filed at 02/03/2018 0200 Gross per 24 hour  Intake 1262.92 ml  Output -  Net 1262.92 ml   Filed Weights   02/02/18 1636  Weight: 36.2 kg (79 lb 12.9 oz)    Examination:  General exam: Sleepy, NAD Respiratory system: Decreased breath sounds bilaterally, diffuse crackle Cardiovascular system: S1-S2, RRR, no murmur Gastrointestinal system: Abdomen is nondistended, soft and nontender.  Central nervous system: Lethargic, but responsive to verbal stimuli. Follows simple commands Extremities: Bilateral BKA, stump with dressing clean and intact. Skin: No rashes Psychiatry: Denies SI to me, report speaking out of frustration   Data Reviewed: I have personally reviewed following labs and imaging studies  CBC: Recent Labs  Lab 02/02/18 1324 02/03/18 0630  WBC 19.1* 14.7*  HGB 8.3* 6.2*  HCT 25.5* 18.9*  MCV 86.1 90.0  PLT 507* 338   Basic Metabolic Panel: Recent Labs  Lab 02/02/18 1324 02/02/18 2041 02/03/18 0630  NA 143  --  142  K 3.3*  --  3.4*  CL 109  --  114*  CO2 22  --  20*  GLUCOSE 113*  --  113*  BUN 12  --  13  CREATININE 0.66 0.62 0.64  CALCIUM 9.1  --  7.9*   GFR: Estimated Creatinine Clearance: 45.9 mL/min (by C-G formula based on SCr of 0.64 mg/dL). Liver Function Tests: Recent Labs  Lab 02/02/18 1324  AST 26  ALT 10*  ALKPHOS 82  BILITOT 0.9  PROT 7.5  ALBUMIN 3.3*   No results for input(s): LIPASE, AMYLASE in the last 168 hours. No results for input(s): AMMONIA in the last 168 hours. Coagulation Profile: Recent Labs  Lab 02/02/18 2041  INR 1.28   Cardiac Enzymes: Recent Labs  Lab 02/02/18 1324  TROPONINI 0.09*   BNP (last 3 results) No results for input(s): PROBNP in the last 8760 hours. HbA1C: No results for input(s): HGBA1C in the last 72 hours. CBG: No results for input(s): GLUCAP in the last 168  hours. Lipid Profile: No results for input(s): CHOL, HDL, LDLCALC, TRIG, CHOLHDL, LDLDIRECT in the last 72 hours. Thyroid Function Tests: No results for input(s): TSH, T4TOTAL, FREET4, T3FREE, THYROIDAB in the last 72 hours. Anemia Panel: No results for input(s): VITAMINB12, FOLATE, FERRITIN, TIBC, IRON, RETICCTPCT in the last 72 hours. Sepsis Labs: Recent Labs  Lab 02/02/18 1505 02/02/18 2041 02/03/18 0013  PROCALCITON  --  0.17  --   LATICACIDVEN 2.53* 1.5 1.1    Recent Results (from the past 240 hour(s))  MRSA PCR Screening     Status: None   Collection Time: 02/02/18  4:38 PM  Result Value Ref Range Status   MRSA by PCR NEGATIVE NEGATIVE Final    Comment:  The GeneXpert MRSA Assay (FDA approved for NASAL specimens only), is one component of a comprehensive MRSA colonization surveillance program. It is not intended to diagnose MRSA infection nor to guide or monitor treatment for MRSA infections. Performed at [facility], [place] [address] [place], [place] [address]      Radiology Studies: Dg Chest 2 View  Addendum Date: 02/02/2018   ADDENDUM REPORT: 02/02/2018 14:33 ADDENDUM: Acute findings discussed with and reconfirmed by Dr.KEVIN CAMPOS on 02/02/2018 at 2:32 pm. Electronically Signed   By: Elon Alas M.D.   On: 02/02/2018 14:33   Result Date: 02/02/2018 CLINICAL DATA:  Mid chest pain, cough and congestion. EXAM: CHEST - 2 VIEW COMPARISON:  Chest radiograph January 20, 2018 FINDINGS: Multifocal RIGHT lung consolidation. Mild chronic interstitial changes without pleural effusion. Borderline cardiomegaly. Calcified aortic knob. No pneumothorax. Soft tissue planes included osseous structures are nonsuspicious. IMPRESSION: 1. Multifocal RIGHT pneumonia. 2. Borderline cardiomegaly. 3.  Aortic Atherosclerosis (ICD10-I70.0). Electronically Signed: By: Elon Alas M.D. On: 02/02/2018 14:28   Dg Knee 1-2 Views Left  Result Date:  02/02/2018 CLINICAL DATA:  Knee pain.  Status post BKA. EXAM: LEFT KNEE - 1-2 VIEW COMPARISON:  01/20/2018 FINDINGS: Bones are demineralized. No evidence for an acute fracture. No subluxation or dislocation. No worrisome lytic or sclerotic osseous abnormality. No gross joint effusion evident. There is some soft tissue irregularity at the stump. IMPRESSION: Osteopenia without acute bony abnormality. Soft tissue irregularity at the stump may be postoperative scarring although wound/ulcer could have this appearance. Electronically Signed   By: Misty Stanley M.D.   On: 02/02/2018 14:30   Dg Knee 1-2 Views Right  Result Date: 02/02/2018 CLINICAL DATA:  Pain and necrosis at stump, bleeding. EXAM: RIGHT KNEE - 1-2 VIEW COMPARISON:  RIGHT knee radiograph January 20, 2018 FINDINGS: Status post below-knee amputation. No acute osseous process. No rib fracture deformity or dislocation. No advanced degenerative change for age. Increasing soft tissue swelling with superficial irregularity and subcutaneous gas about the stump. IMPRESSION: Soft tissue swelling with new subcutaneous gas seen with necrotizing fasciitis or recent instrumentation. No acute osseous process. Acute findings discussed with and reconfirmed by Dr.KEVIN CAMPOS on 02/02/2018 at 2:32 pm. Electronically Signed   By: Elon Alas M.D.   On: 02/02/2018 14:32      Scheduled Meds: . amLODipine  10 mg Oral Daily  . busPIRone  15 mg Oral TID  . enoxaparin (LOVENOX) injection  30 mg Subcutaneous Q24H  . escitalopram  20 mg Oral QHS  . furosemide  20 mg Intravenous Once  . furosemide  20 mg Intravenous Once  . furosemide  20 mg Intravenous Once   Continuous Infusions: . sodium chloride Stopped (02/03/18 0846)  . sodium chloride    . ceFEPime (MAXIPIME) IV 1 g (02/03/18 0518)  . vancomycin       LOS: 1 day    Time spent: Total of 35 minutes spent with pt, greater than 50% of which was spent in discussion of  treatment, counseling and  coordination of care  Chipper Oman, MD Pager: Text Page via [email or web address]   If 7PM-7AM, please contact night-coverage [email or web address] 02/03/2018, 8:52 AM   Note - This record has been created using [employer]. Chart creation errors have been sought, but may not always have been located. Such creation errors do not reflect on the standard of medical care.

## 2018-02-03 NOTE — Progress Notes (Signed)
   02/03/18 0933  Clinical Encounter Type  Visited With Patient;Health care provider  Visit Type Initial;Spiritual support  Referral From Nurse  Consult/Referral To Chaplain  Spiritual Encounters  Spiritual Needs Prayer   Responded to a SCC.  Patient was in the bed with sitter at bedside.  Patient was dozing, but acknowledged my presence.  Stated she was doing ok.  She welcomed prayer.  Will follow and support as needed. Chaplain Katherene Ponto

## 2018-02-04 LAB — TYPE AND SCREEN
ABO/RH(D): A POS
Antibody Screen: NEGATIVE
Unit division: 0
Unit division: 0

## 2018-02-04 LAB — COMPREHENSIVE METABOLIC PANEL
ALK PHOS: 65 U/L (ref 38–126)
ALT: 9 U/L — AB (ref 14–54)
AST: 14 U/L — ABNORMAL LOW (ref 15–41)
Albumin: 2.3 g/dL — ABNORMAL LOW (ref 3.5–5.0)
Anion gap: 9 (ref 5–15)
BILIRUBIN TOTAL: 1.3 mg/dL — AB (ref 0.3–1.2)
BUN: 14 mg/dL (ref 6–20)
CHLORIDE: 107 mmol/L (ref 101–111)
CO2: 23 mmol/L (ref 22–32)
Calcium: 8.4 mg/dL — ABNORMAL LOW (ref 8.9–10.3)
Creatinine, Ser: 0.68 mg/dL (ref 0.44–1.00)
GFR calc Af Amer: >60 mL/min (ref >60)
GFR calc non Af Amer: >60 mL/min (ref >60)
GLUCOSE: 119 mg/dL — AB (ref 65–99)
Potassium: 3.2 mmol/L — ABNORMAL LOW (ref 3.5–5.1)
SODIUM: 139 mmol/L (ref 135–145)
Total Protein: 6 g/dL — ABNORMAL LOW (ref 6.5–8.1)

## 2018-02-04 LAB — BPAM RBC
BLOOD PRODUCT EXPIRATION DATE: 201905212359
Blood Product Expiration Date: 201905212359
ISSUE DATE / TIME: 201905020937
ISSUE DATE / TIME: 201905021250
UNIT TYPE AND RH: 6200
Unit Type and Rh: 6200

## 2018-02-04 LAB — CBC WITH DIFFERENTIAL/PLATELET
Basophils Absolute: 0 10*3/uL (ref 0.0–0.1)
Basophils Relative: 0 %
EOS PCT: 0 %
Eosinophils Absolute: 0.1 10*3/uL (ref 0.0–0.7)
HCT: 30.4 % — ABNORMAL LOW (ref 36.0–46.0)
Hemoglobin: 10.2 g/dL — ABNORMAL LOW (ref 12.0–15.0)
LYMPHS ABS: 1.3 10*3/uL (ref 0.7–4.0)
LYMPHS PCT: 7 %
MCH: 29.5 pg (ref 26.0–34.0)
MCHC: 33.6 g/dL (ref 30.0–36.0)
MCV: 87.9 fL (ref 78.0–100.0)
MONO ABS: 0.5 10*3/uL (ref 0.1–1.0)
Monocytes Relative: 3 %
Neutro Abs: 16.2 10*3/uL — ABNORMAL HIGH (ref 1.7–7.7)
Neutrophils Relative %: 90 %
PLATELETS: 280 10*3/uL (ref 150–400)
RBC: 3.46 MIL/uL — ABNORMAL LOW (ref 3.87–5.11)
RDW: 17.7 % — AB (ref 11.5–15.5)
WBC: 18.1 10*3/uL — ABNORMAL HIGH (ref 4.0–10.5)

## 2018-02-04 LAB — MAGNESIUM: Magnesium: 1.7 mg/dL (ref 1.7–2.4)

## 2018-02-04 LAB — PROCALCITONIN: Procalcitonin: 0.6 ng/mL

## 2018-02-04 MED ORDER — POTASSIUM CHLORIDE CRYS ER 20 MEQ PO TBCR
40.0000 meq | EXTENDED_RELEASE_TABLET | ORAL | Status: AC
  Administered 2018-02-04 (×2): 40 meq via ORAL
  Filled 2018-02-04 (×2): qty 2

## 2018-02-04 MED ORDER — MAGNESIUM SULFATE 2 GM/50ML IV SOLN
2.0000 g | Freq: Once | INTRAVENOUS | Status: AC
  Administered 2018-02-04: 2 g via INTRAVENOUS
  Filled 2018-02-04: qty 50

## 2018-02-04 MED ORDER — POTASSIUM CHLORIDE CRYS ER 20 MEQ PO TBCR
40.0000 meq | EXTENDED_RELEASE_TABLET | Freq: Once | ORAL | Status: AC
  Administered 2018-02-04: 40 meq via ORAL
  Filled 2018-02-04: qty 2

## 2018-02-04 NOTE — Progress Notes (Signed)
Suicide precautions and sitter discontinued per MD instruction.

## 2018-02-04 NOTE — Care Management Note (Signed)
Case Management Note  Patient Details  Name: [name] MRN: [id number] Date of Birth: [birth year]  Subjective/Objective:                  Sepsis protocol  Action/Plan: Date: Feb 04, 2018 Velva Harman, BSN, [place], [place] [phone] Chart and notes review for patient progress and needs. Will follow for case management and discharge needs. No cm or discharge needs present at time of this review. Next review date: [id number]  Expected Discharge Date:  (unknown)               Expected Discharge Plan:  Home/Self Care  In-House Referral:     Discharge planning Services  CM Consult  Post Acute Care Choice:    Choice offered to:     DME Arranged:    DME Agency:     HH Arranged:    HH Agency:     Status of Service:  In process, will continue to follow  If discussed at Long Length of Stay Meetings, dates discussed:    Additional Comments:  Leeroy Cha, RN 02/04/2018, 8:37 AM

## 2018-02-04 NOTE — Progress Notes (Addendum)
PROGRESS NOTE [facility]   [name]   VHQ:[id number] DOB: 09/22/1961  DOA: 02/02/2018 PCP: No primary care provider on file.   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  Upon ED evaluation was found to have elevated WBC at 19, elevated lactic acid at 2.5, chest x-ray with signs of right-sided pneumonia.  X-ray of the right stump shows soft tissue swelling and no subcutaneous gas.  Patient was admitted with working diagnosis of pneumonia and possible right stump infection.  While on the ED patient verbalized suicidal ideation and psychiatry has been consulted.  Subjective: Patient seen and examined, she is more alert and oriented today, continues to c/o pain "all over". Hgb up. Clinically improving. WBC higher.   Assessment & Plan: Acute on chronic respiratory failure with hypoxia due sepsis 2/2 HCAP  Blood cx no growth thus far. Continue oxygen supplementation wean as able.  Trend fever and WBC.  Continue Vanc and Cefepime. WBC slight increase from yesterday, likely reactive from blood transfusion.   HCAP  See Above   Bilateral gangrene of transtibial amputations  Given degree of necrosis will continue broad Bactrim antibiotic with vancomycin and cefepime.  Orthopedic surgery has been consulted and recommended AKA, patient has agree.  Recommendation are to [facility] for [facility] next week.  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 Now with acute gangrene will need AKA. Counseled on cocaine abuse.  Social worker consulted   Essential hypertension BP stable Continue current regimen  Anemia of chronic disease Status post 2 units of  PRBCs No signs of active bleeding Hemoglobin up to 10.2 Monitor CBC in a.m.  Acute on chronic diastolic heart failure Secondary to aggressive hydration  Responded well to diuresis, patient received 60 mg IV lasix on 5/2 Seems to be euvolemic. Hold Lasix for now Monitor closely   Major depressive disorder/suicidal idea D/c sitter, psych cleared patient.    DVT prophylaxis: Lovenox Code Status: Full code Family Communication: None at bedside Disposition Plan: Transferred to [facility]   Consultants:   Psychiatry  Orthopedic surgery  Procedures:  None  Antimicrobials: Anti-infectives (From admission, onward)   Start     Dose/Rate Route Frequency Ordered Stop   02/03/18 1800  vancomycin (VANCOCIN) 500 mg in sodium chloride 0.9 % 100 mL IVPB  Status:  Discontinued     500 mg 100 mL/hr over 60 Minutes Intravenous Every 24 hours 02/02/18 1847 02/02/18 1858   02/03/18 1000  vancomycin (VANCOCIN) IVPB 750 mg/150 ml premix     750 mg 150 mL/hr over 60 Minutes Intravenous Every 24 hours 02/02/18 1858     02/02/18 2200  ceFEPIme (MAXIPIME) 1 g in sodium chloride 0.9 % 100 mL IVPB  Status:  Discontinued     1 g 200 mL/hr over 30 Minutes Intravenous Every 12 hours 02/02/18 1847 02/02/18 1858   02/02/18 2200  ceFEPIme (MAXIPIME) 1 g in sodium chloride 0.9 % 100 mL IVPB     1 g 200 mL/hr over 30 Minutes Intravenous Every 8 hours 02/02/18 1858     02/02/18 1845  ceFEPIme (MAXIPIME) 2 g in sodium chloride 0.9 % 100 mL IVPB  Status:  Discontinued     2 g  200 mL/hr over 30 Minutes Intravenous  Once 02/02/18 1830 02/02/18 1847   02/02/18 1845  vancomycin (VANCOCIN) IVPB 1000 mg/200 mL premix  Status:  Discontinued     1,000 mg 200 mL/hr over 60 Minutes Intravenous  Once 02/02/18 1830 02/02/18 1847   02/02/18 1500  vancomycin (VANCOCIN) IVPB 1000 mg/200 mL premix     1,000 mg 200 mL/hr over 60 Minutes Intravenous  Once 02/02/18 1445 02/02/18 1647   02/02/18 1500   piperacillin-tazobactam (ZOSYN) IVPB 3.375 g     3.375 g 100 mL/hr over 30 Minutes Intravenous  Once 02/02/18 1445 02/02/18 1546         Objective: Vitals:   02/04/18 0314 02/04/18 0400 02/04/18 0700 02/04/18 0800  BP:  124/77 (!) 142/72   Pulse:  94 94   Resp:  (!) 24    Temp: 99 F (37.2 C)   98.7 F (37.1 C)  TempSrc: Oral   Oral  SpO2:  96% 98%   Weight:      Height:        Intake/Output Summary (Last 24 hours) at 02/04/2018 0858 Last data filed at 02/04/2018 0550 Gross per 24 hour  Intake 1150 ml  Output 3300 ml  Net -2150 ml   Filed Weights   02/02/18 1636  Weight: 36.2 kg (79 lb 12.9 oz)    Examination:  General: NAD  Cardiovascular: RRR, S1/S2 +, no rubs, no gallops Respiratory: Good air entry, mild rhonchi at the bases  Abdominal: Soft, NT, ND, bowel sounds + Extremities:         Data Reviewed: I have personally reviewed following labs and imaging studies  CBC: Recent Labs  Lab 02/02/18 1324 02/03/18 0630 02/03/18 1713 02/04/18 0337  WBC 19.1* 14.7* 20.4* 18.1*  NEUTROABS  --   --   --  16.2*  HGB 8.3* 6.2* 11.0* 10.2*  HCT 25.5* 18.9* 32.5* 30.4*  MCV 86.1 90.0 87.8 87.9  PLT 507* 308 323 188   Basic Metabolic Panel: Recent Labs  Lab 02/02/18 1324 02/02/18 2041 02/03/18 0630 02/04/18 0337  NA 143  --  142 139  K 3.3*  --  3.4* 3.2*  CL 109  --  114* 107  CO2 22  --  20* 23  GLUCOSE 113*  --  113* 119*  BUN 12  --  13 14  CREATININE 0.66 0.62 0.64 0.68  CALCIUM 9.1  --  7.9* 8.4*  MG  --   --   --  1.7   GFR: Estimated Creatinine Clearance: 45.9 mL/min (by C-G formula based on SCr of 0.68 mg/dL). Liver Function Tests: Recent Labs  Lab 02/02/18 1324 02/04/18 0337  AST 26 14*  ALT 10* 9*  ALKPHOS 82 65  BILITOT 0.9 1.3*  PROT 7.5 6.0*  ALBUMIN 3.3* 2.3*   No results for input(s): LIPASE, AMYLASE in the last 168 hours. No results for input(s): AMMONIA in the last 168 hours. Coagulation Profile: Recent Labs  Lab  02/02/18 2041  INR 1.28   Cardiac Enzymes: Recent Labs  Lab 02/02/18 1324  TROPONINI 0.09*   BNP (last 3 results) No results for input(s): PROBNP in the last 8760 hours. HbA1C: No results for input(s): HGBA1C in the last 72 hours. CBG: No results for input(s): GLUCAP in the last 168 hours. Lipid Profile: No results for input(s): CHOL, HDL, LDLCALC, TRIG, CHOLHDL, LDLDIRECT in the last 72 hours. Thyroid Function Tests: No results for input(s): TSH, T4TOTAL, FREET4, T3FREE, THYROIDAB in the last  72 hours. Anemia Panel: No results for input(s): VITAMINB12, FOLATE, FERRITIN, TIBC, IRON, RETICCTPCT in the last 72 hours. Sepsis Labs: Recent Labs  Lab 02/02/18 1505 02/02/18 2041 02/03/18 0013 02/04/18 0337  PROCALCITON  --  0.17  --  0.60  LATICACIDVEN 2.53* 1.5 1.1  --     Recent Results (from the past 240 hour(s))  Blood culture (routine x 2)     Status: None (Preliminary result)   Collection Time: 02/02/18  2:55 PM  Result Value Ref Range Status   Specimen Description   Final    BLOOD LEFT ARM Performed at [facility] [address], [place], [place] [address]    Special Requests   Final    BOTTLES DRAWN AEROBIC AND ANAEROBIC Blood Culture adequate volume Performed at [facility] [address], [place], [place] [address]    Culture   Final    NO GROWTH < 24 HOURS Performed at [facility] [address], [place], [place] [address]    Report Status PENDING  Incomplete  Blood culture (routine x 2)     Status: None (Preliminary result)   Collection Time: 02/02/18  2:55 PM  Result Value Ref Range Status   Specimen Description   Final    BLOOD RIGHT HAND Performed at [facility] [address] [place], [place] [address]    Special Requests   Final    BOTTLES DRAWN AEROBIC AND ANAEROBIC Blood Culture adequate volume Performed at [facility] [address], [place], [place]  [address]    Culture   Final    NO GROWTH < 24 HOURS Performed at [facility] [address], [place], [place] [address]    Report Status PENDING  Incomplete  MRSA PCR Screening     Status: None   Collection Time: 02/02/18  4:38 PM  Result Value Ref Range Status   MRSA by PCR NEGATIVE NEGATIVE Final    Comment:        The GeneXpert MRSA Assay (FDA approved for NASAL specimens only), is one component of a comprehensive MRSA colonization surveillance program. It is not intended to diagnose MRSA infection nor to guide or monitor treatment for MRSA infections. Performed at [facility], [place] [address], [place], [place] [address]      Radiology Studies: Dg Chest 2 View  Addendum Date: 02/02/2018   ADDENDUM REPORT: 02/02/2018 14:33 ADDENDUM: Acute findings discussed with and reconfirmed by Dr.KEVIN CAMPOS on 02/02/2018 at 2:32 pm. Electronically Signed   By: Elon Alas M.D.   On: 02/02/2018 14:33   Result Date: 02/02/2018 CLINICAL DATA:  Mid chest pain, cough and congestion. EXAM: CHEST - 2 VIEW COMPARISON:  Chest radiograph January 20, 2018 FINDINGS: Multifocal RIGHT lung consolidation. Mild chronic interstitial changes without pleural effusion. Borderline cardiomegaly. Calcified aortic knob. No pneumothorax. Soft tissue planes included osseous structures are nonsuspicious. IMPRESSION: 1. Multifocal RIGHT pneumonia. 2. Borderline cardiomegaly. 3.  Aortic Atherosclerosis (ICD10-I70.0). Electronically Signed: By: Elon Alas M.D. On: 02/02/2018 14:28   Dg Knee 1-2 Views Left  Result Date: 02/02/2018 CLINICAL DATA:  Knee pain.  Status post BKA. EXAM: LEFT KNEE - 1-2 VIEW COMPARISON:  01/20/2018 FINDINGS: Bones are demineralized. No evidence for an acute fracture. No subluxation or dislocation. No worrisome lytic or sclerotic osseous abnormality. No gross joint effusion evident. There is some soft tissue irregularity at the stump. IMPRESSION: Osteopenia without  acute bony abnormality. Soft tissue  irregularity at the stump may be postoperative scarring although wound/ulcer could have this appearance. Electronically Signed   By: Misty Stanley M.D.   On: 02/02/2018 14:30   Dg Knee 1-2 Views Right  Result Date: 02/02/2018 CLINICAL DATA:  Pain and necrosis at stump, bleeding. EXAM: RIGHT KNEE - 1-2 VIEW COMPARISON:  RIGHT knee radiograph January 20, 2018 FINDINGS: Status post below-knee amputation. No acute osseous process. No rib fracture deformity or dislocation. No advanced degenerative change for age. Increasing soft tissue swelling with superficial irregularity and subcutaneous gas about the stump. IMPRESSION: Soft tissue swelling with new subcutaneous gas seen with necrotizing fasciitis or recent instrumentation. No acute osseous process. Acute findings discussed with and reconfirmed by Dr.KEVIN CAMPOS on 02/02/2018 at 2:32 pm. Electronically Signed   By: Elon Alas M.D.   On: 02/02/2018 14:32      Scheduled Meds: . amLODipine  10 mg Oral Daily  . busPIRone  15 mg Oral TID  . enoxaparin (LOVENOX) injection  30 mg Subcutaneous Q24H  . escitalopram  20 mg Oral QHS  . feeding supplement (ENSURE ENLIVE)  237 mL Oral BID BM  . multivitamin with minerals  1 tablet Oral Daily  . potassium chloride  40 mEq Oral Q4H   Continuous Infusions: . sodium chloride Stopped (02/03/18 0846)  . sodium chloride    . ceFEPime (MAXIPIME) IV Stopped (02/04/18 0131)  . vancomycin Stopped (02/03/18 1007)     LOS: 2 days    Time spent: Total of 35 minutes spent with pt, greater than 50% of which was spent in discussion of  treatment, counseling and coordination of care  Chipper Oman, MD Pager: Text Page via [email or web address]   If 7PM-7AM, please contact night-coverage [email or web address] 02/04/2018, 8:58 AM   Note - This record has been created using [employer]. Chart creation errors have been sought, but may not always have been located. Such creation errors do not  reflect on the standard of medical care.

## 2018-02-04 NOTE — Progress Notes (Signed)
Pt admitted via stretcher from [facility] ICU. Pt is A&Ox4. Denies pain. Bilateral transtibial amputations are wrapped in kerlix c/d/i. Pt oriented to room, call button is in reach. VSS. BP 124/80 (BP Location: Left Arm)   Pulse 98   Temp 98.7 F (37.1 C) (Oral)   Resp 20   Ht 5' 1"  (1.549 m) Comment: Per pt  Wt 36.2 kg (79 lb 12.9 oz)   SpO2 91%   BMI 15.08 kg/m

## 2018-02-05 LAB — GLUCOSE, CAPILLARY: Glucose-Capillary: 106 mg/dL — ABNORMAL HIGH (ref 65–99)

## 2018-02-05 MED ORDER — CHLORHEXIDINE GLUCONATE 4 % EX LIQD
60.0000 mL | Freq: Once | CUTANEOUS | Status: AC
  Administered 2018-02-05: 4 via TOPICAL

## 2018-02-05 MED ORDER — POTASSIUM CHLORIDE CRYS ER 20 MEQ PO TBCR
40.0000 meq | EXTENDED_RELEASE_TABLET | Freq: Two times a day (BID) | ORAL | Status: AC
  Administered 2018-02-05 – 2018-02-06 (×3): 40 meq via ORAL
  Filled 2018-02-05 (×2): qty 2

## 2018-02-05 MED ORDER — POVIDONE-IODINE 10 % EX SWAB
2.0000 "application " | Freq: Once | CUTANEOUS | Status: AC
  Administered 2018-02-05: 2 via TOPICAL

## 2018-02-05 MED ORDER — CEFAZOLIN SODIUM-DEXTROSE 2-4 GM/100ML-% IV SOLN: 2.0000 g | INTRAVENOUS | Status: DC

## 2018-02-05 MED ORDER — CEFAZOLIN SODIUM-DEXTROSE 2-4 GM/100ML-% IV SOLN
2.0000 g | INTRAVENOUS | Status: AC
  Filled 2018-02-05: qty 100

## 2018-02-05 NOTE — Progress Notes (Signed)
PROGRESS NOTE [facility]   [name]   MBT:[id number] DOB: [birth year]  DOA: 02/02/2018 PCP: No primary care provider on file.   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  Upon ED evaluation was found to have elevated WBC at 19, elevated lactic acid at 2.5, chest x-ray with signs of right-sided pneumonia.  X-ray of the right stump shows soft tissue swelling and no subcutaneous gas.  Patient was admitted with working diagnosis of pneumonia and possible right stump infection.  While on the ED patient verbalized suicidal ideation and psychiatry has been consulted.  Subjective: Presents with fever chills.  Status post PRBC transfusion  Assessment & Plan: #1 acute on chronic respiratory failure with hypoxia due sepsis 2/2 HCAP: Blood cx no growth thus far. Continue oxygen supplementation wean as able.  Trend fever and WBC.  Continue Vanc and Cefepime. WBC slight increase from yesterday, likely reactive from blood transfusion.     #2 bilateral gangrene of transtibial amputations: Transferred to the hospital for requested hospital for surgery tomorrow-bilateral AKA Continue antibiotics Supportive care N.p.o. after midnight's  #3 cocaine-induced vascular disorder: Leading to lower extremity gangrene status post bilateral BKA. Now with acute gangrene needing AKA.  Counseled on cocaine abuse.  Social worker consulted   #4 Essential hypertension BP stable Continue current regimen  Anemia of chronic disease Status post 2 units of PRBCs No signs of active bleeding Hemoglobin up to 10.2 This is  Acute on chronic diastolic heart failure Secondary to aggressive hydration  Responded well to diuresis, patient  received 60 mg IV lasix on 5/2 Seems to be euvolemic.  Hold Lasix for now Monitor closely   Major depressive disorder/suicidal idea: Initially had a sitter -discontinued after psych clearance  DVT prophylaxis: Lovenox Code Status: Full code Family Communication: None at bedside Disposition Plan: To be determined   Consultants:   Psychiatry  Orthopedic surgery  Procedures:  None  Antimicrobials: Anti-infectives (From admission, onward)   Start     Dose/Rate Route Frequency Ordered Stop   02/06/18 0630  ceFAZolin (ANCEF) IVPB 2g/100 mL premix     2 g 200 mL/hr over 30 Minutes Intravenous To [facility] 02/05/18 1035 02/07/18 0630   02/05/18 1015  ceFAZolin (ANCEF) IVPB 2g/100 mL premix     2 g 200 mL/hr over 30 Minutes Intravenous On call to O.R. 02/05/18 1011 02/06/18 0559   02/03/18 1800  vancomycin (VANCOCIN) 500 mg in sodium chloride 0.9 % 100 mL IVPB  Status:  Discontinued     500 mg 100 mL/hr over 60 Minutes Intravenous Every 24 hours 02/02/18 1847 02/02/18 1858   02/03/18 1000  vancomycin (VANCOCIN) IVPB 750 mg/150 ml premix     750 mg 150 mL/hr over 60 Minutes Intravenous Every 24 hours 02/02/18 1858     02/02/18 2200  ceFEPIme (MAXIPIME) 1 g in sodium chloride 0.9 % 100 mL IVPB  Status:  Discontinued     1 g 200 mL/hr over 30 Minutes Intravenous Every 12 hours 02/02/18 1847 02/02/18 1858   02/02/18 2200  ceFEPIme (MAXIPIME) 1 g in sodium chloride 0.9 % 100 mL IVPB     1 g 200 mL/hr over 30 Minutes Intravenous Every 8 hours 02/02/18 1858     02/02/18 1845  ceFEPIme (MAXIPIME) 2 g in sodium chloride 0.9 % 100 mL IVPB  Status:  Discontinued     2 g 200 mL/hr over 30 Minutes Intravenous  Once 02/02/18 1830 02/02/18 1847   02/02/18 1845  vancomycin (VANCOCIN) IVPB 1000 mg/200 mL premix  Status:  Discontinued     1,000 mg 200 mL/hr over 60 Minutes Intravenous  Once 02/02/18 1830 02/02/18 1847   02/02/18 1500  vancomycin (VANCOCIN) IVPB 1000 mg/200 mL premix      1,000 mg 200 mL/hr over 60 Minutes Intravenous  Once 02/02/18 1445 02/02/18 1647   02/02/18 1500  piperacillin-tazobactam (ZOSYN) IVPB 3.375 g     3.375 g 100 mL/hr over 30 Minutes Intravenous  Once 02/02/18 1445 02/02/18 1546         Objective: Vitals:   02/04/18 1610 02/04/18 1736 02/04/18 2147 02/05/18 0620  BP:  124/80 134/71 131/76  Pulse: (!) 103 98 72 90  Resp: (!) 38 20 18 18   Temp:  98.7 F (37.1 C) 98.5 F (36.9 C) 98.2 F (36.8 C)  TempSrc:  Oral Oral Oral  SpO2: 91% 91% 94% 93%  Weight:      Height:        Intake/Output Summary (Last 24 hours) at 02/05/2018 1208 Last data filed at 02/04/2018 1600 Gross per 24 hour  Intake -  Output 100 ml  Net -100 ml   Filed Weights   02/02/18 1636  Weight: 36.2 kg (79 lb 12.9 oz)    Examination:  General: NAD  Cardiovascular: RRR, S1/S2 +, no rubs, no gallops Respiratory: Good air entry, mild rhonchi at the bases  Abdominal: Soft, NT, ND, bowel sounds + Extremities:         Data Reviewed:   CBC: Recent Labs  Lab 02/02/18 1324 02/03/18 0630 02/03/18 1713 02/04/18 0337  WBC 19.1* 14.7* 20.4* 18.1*  NEUTROABS  --   --   --  16.2*  HGB 8.3* 6.2* 11.0* 10.2*  HCT 25.5* 18.9* 32.5* 30.4*  MCV 86.1 90.0 87.8 87.9  PLT 507* 308 323 354   Basic Metabolic Panel: Recent Labs  Lab 02/02/18 1324 02/02/18 2041 02/03/18 0630 02/04/18 0337  NA 143  --  142 139  K 3.3*  --  3.4* 3.2*  CL 109  --  114* 107  CO2 22  --  20* 23  GLUCOSE 113*  --  113* 119*  BUN 12  --  13 14  CREATININE 0.66 0.62 0.64 0.68  CALCIUM 9.1  --  7.9* 8.4*  MG  --   --   --  1.7   GFR: Estimated Creatinine Clearance: 45.9 mL/min (by C-G formula based on SCr of 0.68 mg/dL). Liver Function Tests: Recent Labs  Lab 02/02/18 1324 02/04/18 0337  AST 26 14*  ALT 10* 9*  ALKPHOS 82 65  BILITOT 0.9 1.3*  PROT 7.5 6.0*  ALBUMIN 3.3* 2.3*   No results for input(s): LIPASE, AMYLASE in the last 168 hours. No results for  input(s): AMMONIA in the last 168 hours. Coagulation Profile: Recent Labs  Lab 02/02/18 2041  INR 1.28   Cardiac Enzymes: Recent Labs  Lab 02/02/18 1324  TROPONINI 0.09*   BNP (last 3 results) No results for input(s): PROBNP in the last 8760 hours. HbA1C: No results for input(s): HGBA1C in the last 72 hours. CBG: No results for input(s): GLUCAP in the last 168 hours. Lipid Profile: No results for input(s): CHOL, HDL, LDLCALC, TRIG, CHOLHDL, LDLDIRECT in the last 72  hours. Thyroid Function Tests: No results for input(s): TSH, T4TOTAL, FREET4, T3FREE, THYROIDAB in the last 72 hours. Anemia Panel: No results for input(s): VITAMINB12, FOLATE, FERRITIN, TIBC, IRON, RETICCTPCT in the last 72 hours. Sepsis Labs: Recent Labs  Lab 02/02/18 1505 02/02/18 2041 02/03/18 0013 02/04/18 0337  PROCALCITON  --  0.17  --  0.60  LATICACIDVEN 2.53* 1.5 1.1  --     Recent Results (from the past 240 hour(s))  Blood culture (routine x 2)     Status: None (Preliminary result)   Collection Time: 02/02/18  2:55 PM  Result Value Ref Range Status   Specimen Description   Final    BLOOD LEFT ARM Performed at [facility] [address], [place], [place] [address]    Special Requests   Final    BOTTLES DRAWN AEROBIC AND ANAEROBIC Blood Culture adequate volume Performed at [facility] [address], [place], [place] [address]    Culture   Final    NO GROWTH 2 DAYS Performed at [facility] [address], [place], [place] [address]    Report Status PENDING  Incomplete  Blood culture (routine x 2)     Status: None (Preliminary result)   Collection Time: 02/02/18  2:55 PM  Result Value Ref Range Status   Specimen Description   Final    BLOOD RIGHT HAND Performed at [facility] [address] [place], [place] [address]    Special Requests   Final    BOTTLES DRAWN AEROBIC AND ANAEROBIC Blood Culture adequate  volume Performed at [facility] [address] [place], [place] [address]    Culture   Final    NO GROWTH 2 DAYS Performed at [facility] [address] [place], [place] [address]    Report Status PENDING  Incomplete  MRSA PCR Screening     Status: None   Collection Time: 02/02/18  4:38 PM  Result Value Ref Range Status   MRSA by PCR NEGATIVE NEGATIVE Final    Comment:        The GeneXpert MRSA Assay (FDA approved for NASAL specimens only), is one component of a comprehensive MRSA colonization surveillance program. It is not intended to diagnose MRSA infection nor to guide or monitor treatment for MRSA infections. Performed at [facility], [place] [address] [place], [place] [address]      Radiology Studies: No results found.    Scheduled Meds: . amLODipine  10 mg Oral Daily  . busPIRone  15 mg Oral TID  . chlorhexidine  60 mL Topical Once  . enoxaparin (LOVENOX) injection  30 mg Subcutaneous Q24H  . escitalopram  20 mg Oral QHS  . feeding supplement (ENSURE ENLIVE)  237 mL Oral BID BM  . multivitamin with minerals  1 tablet Oral Daily  . povidone-iodine  2 application Topical Once   Continuous Infusions: . sodium chloride 50 mL/hr at 02/04/18 1651  . sodium chloride    .  ceFAZolin (ANCEF) IV    . [START ON 02/06/2018]  ceFAZolin (ANCEF) IV    . ceFEPime (MAXIPIME) IV Stopped (02/05/18 0603)  . vancomycin 750 mg (02/05/18 1005)     LOS: 3 days    Time spent: Total of 25 minutes spent with pt, greater than 50% of which was spent in discussion of  treatment, counseling and coordination of care  Benito Mccreedy, MD Pager: [phone] Text Page via [email or web address]  If 7PM-7AM, please contact night-coverage [email or web address] 02/05/2018, 12:08 PM   Note - This record has been created using [employer]. Chart creation errors have been sought, but may not always have been located. Such creation errors do not reflect  on the standard of medical care.

## 2018-02-05 NOTE — Progress Notes (Signed)
[facility] for Vanco/Cefepime Indication: HCAP, bilateral stump gangrene.   Allergies  Allergen Reactions  . Acetaminophen Swelling and Other (See Comments)    Reaction:  Eyelid swelling  . Lisinopril Other (See Comments)    Unk    Patient Measurements: Height: 5' 1"  (154.9 cm)(Per pt) Weight: 79 lb 12.9 oz (36.2 kg) IBW/kg (Calculated) : 47.8 Adjusted Body Weight:   Vital Signs: Temp: 98.2 F (36.8 C) (05/04 0620) Temp Source: Oral (05/04 0620) BP: 131/76 (05/04 0620) Pulse Rate: 90 (05/04 0620) Intake/Output from previous day: 05/03 0701 - 05/04 0700 In: -  Out: 300 [Urine:300] Intake/Output from this shift: No intake/output data recorded.  Labs: Recent Labs    02/02/18 2041 02/03/18 0630 02/03/18 1713 02/04/18 0337  WBC  --  14.7* 20.4* 18.1*  HGB  --  6.2* 11.0* 10.2*  PLT  --  308 323 280  CREATININE 0.62 0.64  --  0.68   Estimated Creatinine Clearance: 45.9 mL/min (by C-G formula based on SCr of 0.68 mg/dL). No results for input(s): VANCOTROUGH, VANCOPEAK, VANCORANDOM, GENTTROUGH, GENTPEAK, GENTRANDOM, TOBRATROUGH, TOBRAPEAK, TOBRARND, AMIKACINPEAK, AMIKACINTROU, AMIKACIN in the last 72 hours.   Microbiology:   Medical History: Past Medical History:  Diagnosis Date  . CAP (community acquired pneumonia) 03/2014 X 2  . Cocaine abuse ([place])    ongoing with resultant vaculitis.  . Depression   . Headache    "weekly" (07/29/2016)  . Hypertension   . Inflammatory arthritis   . Migraines    "probably 5-6/yr" (07/29/2016)  . Normocytic anemia    BL Hgb 9.8-12. Last anemia panel 04/2010 - showing Fe 19, ferritin 101.  Pt on monthly B12 injections  . Rheumatoid arthritis(714.0)    patient reported  . 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 Followed by Dr. Louanne Skye   Assessment:  ID: HCAP, bilateral stump gangrene. Needs B AKA. Tmax 99.9, WBC 18.1, SCr 0.68,  stable LA 2.53 > wnl  Antimicrobials this admission: 5/1 Zosyn x1 in ED 5/1 Vancomycin >>  5/1 Cefepime  >>   Dose adjustments this admission:  Microbiology results: 5/1 BCx: ngtd 5/1 MRSA PCR: neg 5/1 HIV: non-reactive  Goal of Therapy:  Vancomycin trough level 15-20 mcg/ml  Plan:  Plan bilateral AKAs Sunday Vanc 729m q24. Trough likely Monday Cefepime 1g q8   [name] S. RAlford Highland PharmD, BCPS Clinical Staff Pharmacist Pager [phone] REilene GhaziStillinger 02/05/2018,10:53 AM

## 2018-02-05 NOTE — H&P (View-Only) (Signed)
[name], female   DOB: 11-28-1960, 57 y.o.   MRN: [id number] Patient transferred from [facility] to proceed with above-the-knee amputations bilaterally.  Patient states that she is not ready for surgery today but would like to proceed for surgery tomorrow.  Her hemoglobin is 10.2 albumin 2.3 with severe protein caloric malnutrition.  We will plan for bilateral above-knee amputations tomorrow Sunday morning n.p.o. after midnight.

## 2018-02-05 NOTE — Progress Notes (Signed)
[name], female   DOB: September 13, 1961, 57 y.o.   MRN: [id number] Patient transferred from [facility] to proceed with above-the-knee amputations bilaterally.  Patient states that she is not ready for surgery today but would like to proceed for surgery tomorrow.  Her hemoglobin is 10.2 albumin 2.3 with severe protein caloric malnutrition.  We will plan for bilateral above-knee amputations tomorrow Sunday morning n.p.o. after midnight.

## 2018-02-06 ENCOUNTER — Encounter (HOSPITAL_COMMUNITY)
Admission: EM | Disposition: A | Discharge status: Home / Self Care | Payer: Self-pay | Source: Home / Self Care | Attending: Internal Medicine

## 2018-02-06 ENCOUNTER — Encounter (HOSPITAL_COMMUNITY): Payer: Self-pay | Admitting: Anesthesiology

## 2018-02-06 ENCOUNTER — Inpatient Hospital Stay (HOSPITAL_COMMUNITY): Payer: Medicaid Other | Admitting: Certified Registered Nurse Anesthetist

## 2018-02-06 DIAGNOSIS — F1420 Cocaine dependence, uncomplicated: Secondary | ICD-10-CM

## 2018-02-06 DIAGNOSIS — I70263 Atherosclerosis of native arteries of extremities with gangrene, bilateral legs: Secondary | ICD-10-CM

## 2018-02-06 DIAGNOSIS — E43 Unspecified severe protein-calorie malnutrition: Secondary | ICD-10-CM

## 2018-02-06 HISTORY — PX: AMPUTATION: SHX166

## 2018-02-06 LAB — BASIC METABOLIC PANEL
Anion gap: 8 (ref 5–15)
BUN: 14 mg/dL (ref 6–20)
CHLORIDE: 111 mmol/L (ref 101–111)
CO2: 20 mmol/L — ABNORMAL LOW (ref 22–32)
Calcium: 8.4 mg/dL — ABNORMAL LOW (ref 8.9–10.3)
Creatinine, Ser: 0.6 mg/dL (ref 0.44–1.00)
GFR calc non Af Amer: >60 mL/min (ref >60)
Glucose, Bld: 87 mg/dL (ref 65–99)
Potassium: 4.1 mmol/L (ref 3.5–5.1)
SODIUM: 139 mmol/L (ref 135–145)

## 2018-02-06 LAB — CBC
HEMATOCRIT: 31.5 % — AB (ref 36.0–46.0)
HEMOGLOBIN: 10.2 g/dL — AB (ref 12.0–15.0)
MCH: 29 pg (ref 26.0–34.0)
MCHC: 32.4 g/dL (ref 30.0–36.0)
MCV: 89.5 fL (ref 78.0–100.0)
Platelets: 301 10*3/uL (ref 150–400)
RBC: 3.52 MIL/uL — AB (ref 3.87–5.11)
RDW: 17.8 % — ABNORMAL HIGH (ref 11.5–15.5)
WBC: 8.2 10*3/uL (ref 4.0–10.5)

## 2018-02-06 LAB — GLUCOSE, CAPILLARY: Glucose-Capillary: 211 mg/dL — ABNORMAL HIGH (ref 65–99)

## 2018-02-06 SURGERY — AMPUTATION, ABOVE KNEE
Anesthesia: General | Site: Leg Upper | Laterality: Bilateral

## 2018-02-06 MED ORDER — LACTATED RINGERS IV SOLN: INTRAVENOUS | Status: DC

## 2018-02-06 MED ORDER — ONDANSETRON HCL 4 MG/2ML IJ SOLN
INTRAMUSCULAR | Status: AC
  Filled 2018-02-06: qty 2

## 2018-02-06 MED ORDER — LIDOCAINE 2% (20 MG/ML) 5 ML SYRINGE
INTRAMUSCULAR | Status: DC | PRN
Start: 2018-02-06 — End: 2018-02-06
  Administered 2018-02-06: 40 mg via INTRAVENOUS

## 2018-02-06 MED ORDER — OXYCODONE HCL 5 MG PO TABS
10.0000 mg | ORAL_TABLET | ORAL | Status: DC | PRN
  Administered 2018-02-06 – 2018-02-08 (×5): 15 mg via ORAL
  Administered 2018-02-08: 10 mg via ORAL
  Administered 2018-02-08 – 2018-02-11 (×7): 15 mg via ORAL
  Administered 2018-02-12: 10 mg via ORAL
  Administered 2018-02-15 – 2018-02-26 (×21): 15 mg via ORAL
  Administered 2018-02-27: 10 mg via ORAL
  Administered 2018-02-28 – 2018-03-01 (×5): 15 mg via ORAL
  Filled 2018-02-06 (×16): qty 3
  Filled 2018-02-06: qty 2
  Filled 2018-02-06 (×24): qty 3

## 2018-02-06 MED ORDER — METHOCARBAMOL 1000 MG/10ML IJ SOLN
500.0000 mg | Freq: Four times a day (QID) | INTRAVENOUS | Status: DC | PRN
  Filled 2018-02-06: qty 5

## 2018-02-06 MED ORDER — ACETAMINOPHEN 325 MG PO TABS
325.0000 mg | ORAL_TABLET | Freq: Four times a day (QID) | ORAL | Status: DC | PRN
  Administered 2018-02-11 – 2018-02-25 (×3): 650 mg via ORAL
  Filled 2018-02-06 (×4): qty 2

## 2018-02-06 MED ORDER — SODIUM CHLORIDE 0.9 % IV SOLN
INTRAVENOUS | Status: DC
  Administered 2018-02-06: 11:00:00 via INTRAVENOUS

## 2018-02-06 MED ORDER — HYDROMORPHONE HCL 2 MG/ML IJ SOLN: 0.3000 mg | INTRAMUSCULAR | Status: DC | PRN

## 2018-02-06 MED ORDER — PROPOFOL 10 MG/ML IV BOLUS
INTRAVENOUS | Status: AC
  Filled 2018-02-06: qty 20

## 2018-02-06 MED ORDER — BISACODYL 10 MG RE SUPP: 10.0000 mg | Freq: Every day | RECTAL | Status: DC | PRN

## 2018-02-06 MED ORDER — FENTANYL CITRATE (PF) 250 MCG/5ML IJ SOLN
INTRAMUSCULAR | Status: AC
  Filled 2018-02-06: qty 5

## 2018-02-06 MED ORDER — POLYETHYLENE GLYCOL 3350 17 G PO PACK: 17.0000 g | PACK | Freq: Every day | ORAL | Status: DC | PRN

## 2018-02-06 MED ORDER — SUCCINYLCHOLINE CHLORIDE 200 MG/10ML IV SOSY
PREFILLED_SYRINGE | INTRAVENOUS | Status: AC
  Filled 2018-02-06: qty 10

## 2018-02-06 MED ORDER — DEXAMETHASONE SODIUM PHOSPHATE 10 MG/ML IJ SOLN
INTRAMUSCULAR | Status: DC | PRN
  Administered 2018-02-06: 5 mg via INTRAVENOUS

## 2018-02-06 MED ORDER — OXYCODONE HCL 5 MG PO TABS
5.0000 mg | ORAL_TABLET | ORAL | Status: DC | PRN
  Administered 2018-02-11 – 2018-02-12 (×2): 10 mg via ORAL
  Administered 2018-02-13 – 2018-02-14 (×2): 5 mg via ORAL
  Administered 2018-02-14 – 2018-03-02 (×7): 10 mg via ORAL
  Filled 2018-02-06 (×7): qty 2
  Filled 2018-02-06: qty 1
  Filled 2018-02-06 (×4): qty 2
  Filled 2018-02-06: qty 1
  Filled 2018-02-06: qty 2

## 2018-02-06 MED ORDER — ONDANSETRON HCL 4 MG PO TABS: 4.0000 mg | ORAL_TABLET | Freq: Four times a day (QID) | ORAL | Status: DC | PRN

## 2018-02-06 MED ORDER — MIDAZOLAM HCL 2 MG/2ML IJ SOLN
INTRAMUSCULAR | Status: DC | PRN
  Administered 2018-02-06: 1 mg via INTRAVENOUS

## 2018-02-06 MED ORDER — METOCLOPRAMIDE HCL 5 MG PO TABS: 5.0000 mg | ORAL_TABLET | Freq: Three times a day (TID) | ORAL | Status: DC | PRN

## 2018-02-06 MED ORDER — PROMETHAZINE HCL 25 MG/ML IJ SOLN: 6.2500 mg | INTRAMUSCULAR | Status: DC | PRN

## 2018-02-06 MED ORDER — PHENYLEPHRINE 40 MCG/ML (10ML) SYRINGE FOR IV PUSH (FOR BLOOD PRESSURE SUPPORT)
PREFILLED_SYRINGE | INTRAVENOUS | Status: AC
  Filled 2018-02-06: qty 10

## 2018-02-06 MED ORDER — METHOCARBAMOL 500 MG PO TABS
500.0000 mg | ORAL_TABLET | Freq: Four times a day (QID) | ORAL | Status: DC | PRN
  Administered 2018-02-07 – 2018-03-01 (×27): 500 mg via ORAL
  Filled 2018-02-06 (×30): qty 1

## 2018-02-06 MED ORDER — LACTATED RINGERS IV SOLN
INTRAVENOUS | Status: DC | PRN
  Administered 2018-02-06: 07:00:00 via INTRAVENOUS

## 2018-02-06 MED ORDER — MEPERIDINE HCL 50 MG/ML IJ SOLN: 6.2500 mg | INTRAMUSCULAR | Status: DC | PRN

## 2018-02-06 MED ORDER — MAGNESIUM CITRATE PO SOLN: 1.0000 | Freq: Once | ORAL | Status: DC | PRN

## 2018-02-06 MED ORDER — PHENYLEPHRINE HCL 10 MG/ML IJ SOLN
INTRAMUSCULAR | Status: DC | PRN
  Administered 2018-02-06 (×2): 80 ug via INTRAVENOUS

## 2018-02-06 MED ORDER — 0.9 % SODIUM CHLORIDE (POUR BTL) OPTIME
TOPICAL | Status: DC | PRN
  Administered 2018-02-06: 1000 mL

## 2018-02-06 MED ORDER — DOCUSATE SODIUM 100 MG PO CAPS
100.0000 mg | ORAL_CAPSULE | Freq: Two times a day (BID) | ORAL | Status: DC
  Administered 2018-02-06 – 2018-03-01 (×35): 100 mg via ORAL
  Filled 2018-02-06 (×49): qty 1

## 2018-02-06 MED ORDER — HYDROMORPHONE HCL 2 MG/ML IJ SOLN: 0.5000 mg | INTRAMUSCULAR | Status: DC | PRN

## 2018-02-06 MED ORDER — METOCLOPRAMIDE HCL 5 MG/ML IJ SOLN: 5.0000 mg | Freq: Three times a day (TID) | INTRAMUSCULAR | Status: DC | PRN

## 2018-02-06 MED ORDER — FENTANYL CITRATE (PF) 250 MCG/5ML IJ SOLN
INTRAMUSCULAR | Status: DC | PRN
  Administered 2018-02-06: 25 ug via INTRAVENOUS
  Administered 2018-02-06: 50 ug via INTRAVENOUS
  Administered 2018-02-06 (×2): 25 ug via INTRAVENOUS
  Administered 2018-02-06 (×3): 50 ug via INTRAVENOUS

## 2018-02-06 MED ORDER — DEXAMETHASONE SODIUM PHOSPHATE 10 MG/ML IJ SOLN
INTRAMUSCULAR | Status: AC
  Filled 2018-02-06: qty 1

## 2018-02-06 MED ORDER — ONDANSETRON HCL 4 MG/2ML IJ SOLN
INTRAMUSCULAR | Status: DC | PRN
  Administered 2018-02-06: 4 mg via INTRAVENOUS

## 2018-02-06 MED ORDER — ONDANSETRON HCL 4 MG/2ML IJ SOLN: 4.0000 mg | Freq: Four times a day (QID) | INTRAMUSCULAR | Status: DC | PRN

## 2018-02-06 MED ORDER — MIDAZOLAM HCL 2 MG/2ML IJ SOLN
INTRAMUSCULAR | Status: AC
  Filled 2018-02-06: qty 2

## 2018-02-06 MED ORDER — PROPOFOL 10 MG/ML IV BOLUS
INTRAVENOUS | Status: DC | PRN
  Administered 2018-02-06: 120 mg via INTRAVENOUS

## 2018-02-06 MED ORDER — EPHEDRINE 5 MG/ML INJ
INTRAVENOUS | Status: AC
  Filled 2018-02-06: qty 10

## 2018-02-06 MED ORDER — LIDOCAINE 2% (20 MG/ML) 5 ML SYRINGE
INTRAMUSCULAR | Status: AC
  Filled 2018-02-06: qty 5

## 2018-02-06 MED ORDER — CEFEPIME HCL 1 G IJ SOLR
1.0000 g | INTRAMUSCULAR | Status: DC
  Administered 2018-02-07 – 2018-02-08 (×2): 1 g via INTRAVENOUS
  Filled 2018-02-06 (×2): qty 1

## 2018-02-06 SURGICAL SUPPLY — 36 items
BLADE SAW RECIP 87.9 MT (BLADE) ×3 IMPLANT
BNDG COHESIVE 6X5 TAN STRL LF (GAUZE/BANDAGES/DRESSINGS) ×3 IMPLANT
CANISTER WOUND CARE 500ML ATS (WOUND CARE) ×4 IMPLANT
CONNECTOR Y ATS VAC SYSTEM (MISCELLANEOUS) ×2 IMPLANT
COVER SURGICAL LIGHT HANDLE (MISCELLANEOUS) ×3 IMPLANT
CUFF TOURNIQUET SINGLE 34IN LL (TOURNIQUET CUFF) IMPLANT
DRAPE INCISE IOBAN 66X45 STRL (DRAPES) ×4 IMPLANT
DRAPE U-SHAPE 47X51 STRL (DRAPES) ×3 IMPLANT
DRESSING PREVENA PLUS CUSTOM (GAUZE/BANDAGES/DRESSINGS) IMPLANT
DRSG PREVENA PLUS CUSTOM (GAUZE/BANDAGES/DRESSINGS) ×3
DURAPREP 26ML APPLICATOR (WOUND CARE) ×3 IMPLANT
ELECT REM PT RETURN 9FT ADLT (ELECTROSURGICAL) ×3
ELECTRODE REM PT RTRN 9FT ADLT (ELECTROSURGICAL) ×1 IMPLANT
FACESHIELD WRAPAROUND (MASK) ×3 IMPLANT
FACESHIELD WRAPAROUND OR TEAM (MASK) IMPLANT
GLOVE BIOGEL PI IND STRL 9 (GLOVE) ×1 IMPLANT
GLOVE BIOGEL PI INDICATOR 9 (GLOVE) ×2
GLOVE SURG ORTHO 9.0 STRL STRW (GLOVE) ×3 IMPLANT
GOWN STRL REUS W/ TWL XL LVL3 (GOWN DISPOSABLE) ×2 IMPLANT
GOWN STRL REUS W/TWL XL LVL3 (GOWN DISPOSABLE) ×6
KIT BASIN OR (CUSTOM PROCEDURE TRAY) ×3 IMPLANT
KIT TURNOVER KIT B (KITS) ×3 IMPLANT
MANIFOLD NEPTUNE II (INSTRUMENTS) ×1 IMPLANT
NS IRRIG 1000ML POUR BTL (IV SOLUTION) ×3 IMPLANT
PACK ORTHO EXTREMITY (CUSTOM PROCEDURE TRAY) ×3 IMPLANT
PAD ARMBOARD 7.5X6 YLW CONV (MISCELLANEOUS) ×3 IMPLANT
PAD NEG PRESSURE SENSATRAC (MISCELLANEOUS) ×2 IMPLANT
STAPLER VISISTAT 35W (STAPLE) ×2 IMPLANT
STOCKINETTE IMPERVIOUS LG (DRAPES) ×4 IMPLANT
SUT ETHILON 2 0 PSLX (SUTURE) ×10 IMPLANT
SUT SILK 2 0 (SUTURE) ×3
SUT SILK 2-0 18XBRD TIE 12 (SUTURE) ×1 IMPLANT
TOWEL GREEN STERILE FF (TOWEL DISPOSABLE) ×3 IMPLANT
TUBE CONNECTING 20'X1/4 (TUBING) ×1
TUBE CONNECTING 20X1/4 (TUBING) ×2 IMPLANT
YANKAUER SUCT BULB TIP NO VENT (SUCTIONS) ×3 IMPLANT

## 2018-02-06 NOTE — Op Note (Signed)
02/06/2018  8:38 AM  PATIENT:  [name]    PRE-OPERATIVE DIAGNOSIS: Gangrene bilateral BKA  POST-OPERATIVE DIAGNOSIS:  Same  PROCEDURE:  AMPUTATION ABOVE KNEE, bilateral  SURGEON:  Newt Minion, MD  PHYSICIAN ASSISTANT:None ANESTHESIA:   General  PREOPERATIVE INDICATIONS:  [name] is a  57 y.o. female with a diagnosis of ESRD who failed conservative measures and elected for surgical management.    The risks benefits and alternatives were discussed with the patient preoperatively including but not limited to the risks of infection, bleeding, nerve injury, cardiopulmonary complications, the need for revision surgery, among others, and the patient was willing to proceed.  OPERATIVE IMPLANTS: Praveena customizable x2  @ENCIMAGES @  OPERATIVE FINDINGS: Muscle with good color contractility and consistency.  Minimal petechial bleeding.  OPERATIVE PROCEDURE: Patient was brought the operating room and underwent a general anesthetic.  After adequate levels of anesthesia were obtained patient's both lower extremities were prepped using DuraPrep draped into a sterile field the ischemic necrotic bilateral below the knee amputations were draped in sterile field with impervious stockinette.  A timeout was called.  Attention was first focused on the right lower extremity.  A fishmouth incision was made proximal to the gangrenous ulcers.  This was carried sharply down to bone.  The vascular bundle medially was clamped and suture-ligated with 2-0 silk.  The bone was resected 2 cm proximal to the skin incision.  Electrocautery was used for further hemostasis.  The deep and superficial fascia layers and skin was closed using 2-0 nylon and staples.  Attention was then focused on the left lower extremity.  A fishmouth incision was made just proximal to the gangrenous ulcers.  This was carried sharply down to bone.  The medial vascular bundle was clamped and suture-ligated with 2-0 silk.  The bone  resection was made 2 cm proximal to the skin incision.  Electrocautery was used for further hemostasis.  The deep and superficial fascial layers and skin was closed using 2-0 nylon.  A Praveena customizable was applied to both lower extremities this had a good suction fit through a Y connector.  Patient was extubated taken the PACU in stable condition.   DISCHARGE PLANNING:  Antibiotic duration: 24 hours postoperatively  Weightbearing: Full assist for transfers  Pain medication: Ordered  Dressing care/ Wound VAC: Continue wound VAC for 1 week.  Patient will need the Praveena plus at time of discharge.  This can be obtained from the operating room.  Ambulatory devices: None  Discharge to: Skilled nursing  Follow-up: In the office 1 week post operative.

## 2018-02-06 NOTE — Anesthesia Preprocedure Evaluation (Addendum)
Anesthesia Evaluation  Patient identified by MRN, date of birth, ID band Patient awake    Reviewed: Allergy & Precautions, NPO status , Patient's Chart, lab work & pertinent test results  Airway Mallampati: I  TM Distance: >3 FB Neck ROM: Full    Dental  (+) Edentulous Upper, Edentulous Lower   Pulmonary Current Smoker,     + decreased breath sounds      Cardiovascular hypertension, + Peripheral Vascular Disease   Rhythm:Regular Rate:Normal     Neuro/Psych  Headaches, PSYCHIATRIC DISORDERS Depression  Neuromuscular disease    GI/Hepatic negative GI ROS, Neg liver ROS,   Endo/Other    Renal/GU Renal disease     Musculoskeletal  (+) Arthritis , Rheumatoid disorders,    Abdominal Normal abdominal exam  (+)   Peds  Hematology   Anesthesia Other Findings   Reproductive/Obstetrics                            Lab Results  Component Value Date   WBC 8.2 02/06/2018   HGB 10.2 (L) 02/06/2018   HCT 31.5 (L) 02/06/2018   MCV 89.5 02/06/2018   PLT 301 02/06/2018   Lab Results  Component Value Date   CREATININE 0.60 02/06/2018   BUN 14 02/06/2018   NA 139 02/06/2018   K 4.1 02/06/2018   CL 111 02/06/2018   CO2 20 (L) 02/06/2018   Lab Results  Component Value Date   INR 1.28 02/02/2018   INR 1.08 06/05/2017   INR 1.07 03/30/2017   EKG: sinus tachycardia.  Anesthesia Physical Anesthesia Plan  ASA: III  Anesthesia Plan: General   Post-op Pain Management:    Induction: Intravenous  PONV Risk Score and Plan: 3 and Ondansetron, Dexamethasone and Midazolam  Airway Management Planned: LMA  Additional Equipment: None  Intra-op Plan:   Post-operative Plan: Extubation in OR  Informed Consent: I have reviewed the patients History and Physical, chart, labs and discussed the procedure including the risks, benefits and alternatives for the proposed anesthesia with the patient or  authorized representative who has indicated his/her understanding and acceptance.   Dental advisory given  Plan Discussed with: CRNA  Anesthesia Plan Comments:       Anesthesia Quick Evaluation

## 2018-02-06 NOTE — Progress Notes (Signed)
PROGRESS NOTE [facility]   [name]   VHQ:[id number] DOB: March 13, 1961  DOA: 02/02/2018 PCP: No primary care provider on file.   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  Upon ED evaluation was found to have elevated WBC at 19, elevated lactic acid at 2.5, chest x-ray with signs of right-sided pneumonia.  X-ray of the right stump shows soft tissue swelling and no subcutaneous gas.  Patient was admitted with working diagnosis of pneumonia and possible right stump infection.  While on the ED patient verbalized suicidal ideation and psychiatry has been consulted.  Subjective: Presents with fever chills.  Status post PRBC transfusion  Assessment & Plan: #1 acute on chronic respiratory failure with hypoxia due sepsis 2/2 HCAP: Blood cx no growth thus far. Continue oxygen supplementation wean as able.  Trend fever and WBC.  Continue Vanc and Cefepime. WBC slight increase from yesterday, likely reactive from blood transfusion.     #2 bilateral gangrene of transtibial amputations: Transferred to the hospital for requested hospital for surgery -bilateral AKA 02/06/2018 Continue antibiotics Supportive care Wound Care #3 cocaine-induced vascular disorder: Leading to lower extremity gangrene status post bilateral BKA. Now with acute gangrene needing AKA.  Counseled on cocaine abuse.  Social worker consulted   #4 Essential hypertension BP stable Continue current regimen  Anemia of chronic disease Status post 2 units of PRBCs No signs of active bleeding Hemoglobin up to 10.2 This is  Acute on chronic diastolic heart failure Secondary to aggressive hydration  Responded well to diuresis, patient received 60 mg  IV lasix on 5/2 Seems to be euvolemic.  Hold Lasix for now Monitor closely   Major depressive disorder/suicidal idea: Initially had a sitter -discontinued after psych clearance  DVT prophylaxis: Lovenox Code Status: Full code Family Communication: None at bedside Disposition Plan: To be determined   Consultants:   Psychiatry  Orthopedic surgery  Procedures:  None  Antimicrobials: Anti-infectives (From admission, onward)   Start     Dose/Rate Route Frequency Ordered Stop   02/06/18 0630  ceFAZolin (ANCEF) IVPB 2g/100 mL premix  Status:  Discontinued     2 g 200 mL/hr over 30 Minutes Intravenous To ShortStay Surgical 02/05/18 1035 02/06/18 0925   02/05/18 1015  ceFAZolin (ANCEF) IVPB 2g/100 mL premix     2 g 200 mL/hr over 30 Minutes Intravenous On call to O.R. 02/05/18 1011 02/06/18 0559   02/03/18 1800  vancomycin (VANCOCIN) 500 mg in sodium chloride 0.9 % 100 mL IVPB  Status:  Discontinued     500 mg 100 mL/hr over 60 Minutes Intravenous Every 24 hours 02/02/18 1847 02/02/18 1858   02/03/18 1000  vancomycin (VANCOCIN) IVPB 750 mg/150 ml premix     750 mg 150 mL/hr over 60 Minutes Intravenous Every 24 hours 02/02/18 1858     02/02/18 2200  ceFEPIme (MAXIPIME) 1 g in sodium chloride 0.9 % 100 mL IVPB  Status:  Discontinued     1 g 200 mL/hr over 30 Minutes Intravenous Every 12 hours 02/02/18 1847 02/02/18 1858   02/02/18 2200  ceFEPIme (MAXIPIME) 1 g in sodium chloride 0.9 % 100 mL IVPB     1 g 200 mL/hr over 30 Minutes Intravenous Every 8 hours 02/02/18 1858  02/02/18 1845  ceFEPIme (MAXIPIME) 2 g in sodium chloride 0.9 % 100 mL IVPB  Status:  Discontinued     2 g 200 mL/hr over 30 Minutes Intravenous  Once 02/02/18 1830 02/02/18 1847   02/02/18 1845  vancomycin (VANCOCIN) IVPB 1000 mg/200 mL premix  Status:  Discontinued     1,000 mg 200 mL/hr over 60 Minutes Intravenous  Once 02/02/18 1830 02/02/18 1847   02/02/18 1500  vancomycin (VANCOCIN) IVPB 1000 mg/200 mL  premix     1,000 mg 200 mL/hr over 60 Minutes Intravenous  Once 02/02/18 1445 02/02/18 1647   02/02/18 1500  piperacillin-tazobactam (ZOSYN) IVPB 3.375 g     3.375 g 100 mL/hr over 30 Minutes Intravenous  Once 02/02/18 1445 02/02/18 1546         Objective: Vitals:   02/06/18 0841 02/06/18 0845 02/06/18 0900 02/06/18 0915  BP: 140/79 140/79 (!) 163/95 (!) 162/93  Pulse: 100 96 (!) 102 97  Resp: 16 14 15 15   Temp: 98 F (36.7 C)  97.8 F (36.6 C)   TempSrc:      SpO2: 94% 94% 93% 96%  Weight:      Height:        Intake/Output Summary (Last 24 hours) at 02/06/2018 1420 Last data filed at 02/06/2018 8413 Gross per 24 hour  Intake 300 ml  Output 1200 ml  Net -900 ml   Filed Weights   02/02/18 1636  Weight: 36.2 kg (79 lb 12.9 oz)    Examination:  General: NAD  Cardiovascular: RRR, S1/S2 +, no rubs, no gallops Respiratory: Good air entry, mild rhonchi at the bases  Abdominal: Soft, NT, ND, bowel sounds + Extremities:         Data Reviewed:   CBC: Recent Labs  Lab 02/02/18 1324 02/03/18 0630 02/03/18 1713 02/04/18 0337 02/06/18 0301  WBC 19.1* 14.7* 20.4* 18.1* 8.2  NEUTROABS  --   --   --  16.2*  --   HGB 8.3* 6.2* 11.0* 10.2* 10.2*  HCT 25.5* 18.9* 32.5* 30.4* 31.5*  MCV 86.1 90.0 87.8 87.9 89.5  PLT 507* 308 323 280 244   Basic Metabolic Panel: Recent Labs  Lab 02/02/18 1324 02/02/18 2041 02/03/18 0630 02/04/18 0337 02/06/18 0301  NA 143  --  142 139 139  K 3.3*  --  3.4* 3.2* 4.1  CL 109  --  114* 107 111  CO2 22  --  20* 23 20*  GLUCOSE 113*  --  113* 119* 87  BUN 12  --  13 14 14   CREATININE 0.66 0.62 0.64 0.68 0.60  CALCIUM 9.1  --  7.9* 8.4* 8.4*  MG  --   --   --  1.7  --    GFR: Estimated Creatinine Clearance: 45.9 mL/min (by C-G formula based on SCr of 0.6 mg/dL). Liver Function Tests: Recent Labs  Lab 02/02/18 1324 02/04/18 0337  AST 26 14*  ALT 10* 9*  ALKPHOS 82 65  BILITOT 0.9 1.3*  PROT 7.5 6.0*  ALBUMIN 3.3* 2.3*     No results for input(s): LIPASE, AMYLASE in the last 168 hours. No results for input(s): AMMONIA in the last 168 hours. Coagulation Profile: Recent Labs  Lab 02/02/18 2041  INR 1.28   Cardiac Enzymes: Recent Labs  Lab 02/02/18 1324  TROPONINI 0.09*   BNP (last 3 results) No results for input(s): PROBNP in the last 8760 hours. HbA1C: No results for input(s): HGBA1C in the last 72 hours. CBG:  Recent Labs  Lab 02/05/18 1612  GLUCAP 106*   Lipid Profile: No results for input(s): CHOL, HDL, LDLCALC, TRIG, CHOLHDL, LDLDIRECT in the last 72 hours. Thyroid Function Tests: No results for input(s): TSH, T4TOTAL, FREET4, T3FREE, THYROIDAB in the last 72 hours. Anemia Panel: No results for input(s): VITAMINB12, FOLATE, FERRITIN, TIBC, IRON, RETICCTPCT in the last 72 hours. Sepsis Labs: Recent Labs  Lab 02/02/18 1505 02/02/18 2041 02/03/18 0013 02/04/18 0337  PROCALCITON  --  0.17  --  0.60  LATICACIDVEN 2.53* 1.5 1.1  --     Recent Results (from the past 240 hour(s))  Blood culture (routine x 2)     Status: None (Preliminary result)   Collection Time: 02/02/18  2:55 PM  Result Value Ref Range Status   Specimen Description   Final    BLOOD LEFT ARM Performed at [facility] [address], [place], [place] [address]    Special Requests   Final    BOTTLES DRAWN AEROBIC AND ANAEROBIC Blood Culture adequate volume Performed at [facility] [address] [place], [place] [address]    Culture   Final    NO GROWTH 4 DAYS Performed at [facility], [place] [address], [place], [place] [address]    Report Status PENDING  Incomplete  Blood culture (routine x 2)     Status: None (Preliminary result)   Collection Time: 02/02/18  2:55 PM  Result Value Ref Range Status   Specimen Description   Final    BLOOD RIGHT HAND Performed at [facility] [address] [place], [place] [address]    Special Requests    Final    BOTTLES DRAWN AEROBIC AND ANAEROBIC Blood Culture adequate volume Performed at [facility] [address], [place], [place] [address]    Culture   Final    NO GROWTH 4 DAYS Performed at [facility], [place] [address], [place], [place] [address]    Report Status PENDING  Incomplete  MRSA PCR Screening     Status: None   Collection Time: 02/02/18  4:38 PM  Result Value Ref Range Status   MRSA by PCR NEGATIVE NEGATIVE Final    Comment:        The GeneXpert MRSA Assay (FDA approved for NASAL specimens only), is one component of a comprehensive MRSA colonization surveillance program. It is not intended to diagnose MRSA infection nor to guide or monitor treatment for MRSA infections. Performed at [facility], [place] [address], [place], [place] [address]      Radiology Studies: No results found.    Scheduled Meds: . amLODipine  10 mg Oral Daily  . busPIRone  15 mg Oral TID  . docusate sodium  100 mg Oral BID  . enoxaparin (LOVENOX) injection  30 mg Subcutaneous Q24H  . escitalopram  20 mg Oral QHS  . feeding supplement (ENSURE ENLIVE)  237 mL Oral BID BM  . multivitamin with minerals  1 tablet Oral Daily   Continuous Infusions: . sodium chloride 50 mL/hr at 02/04/18 1651  . sodium chloride    . sodium chloride 10 mL/hr at 02/06/18 1044  . ceFEPime (MAXIPIME) IV Stopped (02/06/18 0610)  . methocarbamol (ROBAXIN)  IV    . vancomycin Stopped (02/06/18 1134)     LOS: 4 days    Time spent: Total of 25 minutes spent with pt, greater than 50% of which was spent in discussion of  treatment, counseling and coordination of care  Benito Mccreedy, MD Pager: [phone] Text Page via [email or web address]   If 7PM-7AM, please contact night-coverage [email or web address] 02/06/2018, 2:20 PM   Note - This record has been created using [employer]. Chart creation errors have been sought, but may not always have been located. Such  creation errors do not reflect on the standard of medical care.

## 2018-02-06 NOTE — Progress Notes (Signed)
Report attempted x2.

## 2018-02-06 NOTE — Transfer of Care (Signed)
Immediate Anesthesia Transfer of Care Note  Patient: [name]  Procedure(s) Performed: AMPUTATION ABOVE KNEE (Bilateral Leg Upper)  Patient Location: PACU  Anesthesia Type:General  Level of Consciousness: awake  Airway & Oxygen Therapy: Patient Spontanous Breathing and Patient connected to nasal cannula oxygen  Post-op Assessment: Report given to RN and Post -op Vital signs reviewed and stable  Post vital signs: Reviewed and stable  Last Vitals:  Vitals Value Taken Time  BP 140/79 02/06/2018  8:42 AM  Temp    Pulse 96 02/06/2018  8:44 AM  Resp 14 02/06/2018  8:44 AM  SpO2 94 % 02/06/2018  8:44 AM  Vitals shown include unvalidated device data.  Last Pain:  Vitals:   02/06/18 0841  TempSrc:   PainSc: (P) Asleep      Patients Stated Pain Goal: 2 (09/32/35 5732)  Complications: No apparent anesthesia complications

## 2018-02-06 NOTE — Interval H&P Note (Signed)
History and Physical Interval Note:  02/06/2018 7:40 AM  [name]  has presented today for surgery, with the diagnosis of ESRD  The various methods of treatment have been discussed with the patient and family. After consideration of risks, benefits and other options for treatment, the patient has consented to  Procedure(s): AMPUTATION ABOVE KNEE (Bilateral) as a surgical intervention .  The patient's history has been reviewed, patient examined, no change in status, stable for surgery.  I have reviewed the patient's chart and labs.  Questions were answered to the patient's satisfaction.     Newt Minion

## 2018-02-06 NOTE — Anesthesia Procedure Notes (Signed)
Procedure Name: LMA Insertion Date/Time: 02/06/2018 7:51 AM Performed by: Clearnce Sorrel, CRNA Pre-anesthesia Checklist: Patient identified, Emergency Drugs available, Suction available, Patient being monitored and Timeout performed Patient Re-evaluated:Patient Re-evaluated prior to induction Preoxygenation: Pre-oxygenation with 100% oxygen Induction Type: IV induction LMA: LMA inserted LMA Size: 3.0 Number of attempts: 1 Placement Confirmation: positive ETCO2 and breath sounds checked- equal and bilateral Tube secured with: Tape Dental Injury: Teeth and Oropharynx as per pre-operative assessment

## 2018-02-06 NOTE — Anesthesia Postprocedure Evaluation (Signed)
Anesthesia Post Note  Patient: [name]  Procedure(s) Performed: AMPUTATION ABOVE KNEE (Bilateral Leg Upper)     Patient location during evaluation: PACU Anesthesia Type: General Level of consciousness: awake and alert Pain management: pain level controlled Vital Signs Assessment: post-procedure vital signs reviewed and stable Respiratory status: spontaneous breathing, nonlabored ventilation, respiratory function stable and patient connected to nasal cannula oxygen Cardiovascular status: blood pressure returned to baseline and stable Postop Assessment: no apparent nausea or vomiting Anesthetic complications: no    Last Vitals:  Vitals:   02/06/18 0845 02/06/18 0900  BP: 140/79 (!) 163/95  Pulse: 96 (!) 102  Resp: 14 15  Temp:  36.6 C  SpO2: 94% 93%    Last Pain:  Vitals:   02/06/18 0841  TempSrc:   PainSc: Asleep                 Effie Berkshire

## 2018-02-07 ENCOUNTER — Encounter (HOSPITAL_COMMUNITY): Payer: Self-pay | Admitting: Orthopedic Surgery

## 2018-02-07 LAB — CULTURE, BLOOD (ROUTINE X 2)
CULTURE: NO GROWTH
CULTURE: NO GROWTH
SPECIAL REQUESTS: ADEQUATE
SPECIAL REQUESTS: ADEQUATE

## 2018-02-07 LAB — CREATININE, SERUM
Creatinine, Ser: 0.99 mg/dL (ref 0.44–1.00)
GFR calc Af Amer: >60 mL/min (ref >60)

## 2018-02-07 LAB — VANCOMYCIN, TROUGH: Vancomycin Tr: 11 ug/mL — ABNORMAL LOW (ref 15–20)

## 2018-02-07 MED ORDER — IPRATROPIUM-ALBUTEROL 0.5-2.5 (3) MG/3ML IN SOLN: 3.0000 mL | Freq: Four times a day (QID) | RESPIRATORY_TRACT | Status: DC | PRN

## 2018-02-07 MED ORDER — IPRATROPIUM-ALBUTEROL 0.5-2.5 (3) MG/3ML IN SOLN
3.0000 mL | Freq: Four times a day (QID) | RESPIRATORY_TRACT | Status: DC
  Administered 2018-02-07: 3 mL via RESPIRATORY_TRACT
  Filled 2018-02-07: qty 3

## 2018-02-07 MED ORDER — VANCOMYCIN HCL IN DEXTROSE 1-5 GM/200ML-% IV SOLN
1000.0000 mg | INTRAVENOUS | Status: DC
  Administered 2018-02-08: 1000 mg via INTRAVENOUS
  Filled 2018-02-07: qty 200

## 2018-02-07 MED ORDER — IPRATROPIUM-ALBUTEROL 0.5-2.5 (3) MG/3ML IN SOLN
3.0000 mL | Freq: Three times a day (TID) | RESPIRATORY_TRACT | Status: DC
  Administered 2018-02-07: 3 mL via RESPIRATORY_TRACT
  Filled 2018-02-07: qty 3

## 2018-02-07 NOTE — Progress Notes (Signed)
PT Cancellation Note  Patient Details Name: [name] MRN: [id number] DOB: 1960/12/23   Cancelled Treatment:    Reason Eval/Treat Not Completed: Pain limiting ability to participate.  Pt is declining working with PT siting pain.  She reports she gets pain medicine every time she can.  She is agreeable for PT to check back tomorrow and she will, "do something" with [place].    Thanks,    Barbarann Ehlers. Angie, [place], DPT [phone]   02/07/2018, 4:08 PM

## 2018-02-07 NOTE — Progress Notes (Signed)
[name], female   DOB: 05-25-1961, 57 y.o.   MRN: [id number] Patient is postoperative day 1 bilateral above-the-knee amputations.  There is no drainage in the wound VAC canister patient with minimal complaints.  Anticipate discharge to skilled nursing.

## 2018-02-07 NOTE — Progress Notes (Addendum)
Pharmacy Antibiotic Note  [name] is a 57 y.o. female admitted on 02/02/2018 with bilateral leg pain and SOB, found to have PNA and bilateral stump gangrene.  Pharmacy has been consulted for vancomycin and cefepime dosing.  Patient is s/p bilateral AKA.    Renal function is stable and vancomycin trough is sub-therapeutic.  Afebrile, WBC WNL.   Plan: Increase vanc to 1035m IV Q24H Cefepime 1gm IV Q24H Monitor renal fxn, clinical progress, abx LOT Consider adding stop date to abx orders   Height: 5' 1"  (154.9 cm)(Per pt) Weight: 79 lb 12.9 oz (36.2 kg) IBW/kg (Calculated) : 47.8  Temp (24hrs), Avg:98 F (36.7 C), Min:97.4 F (36.3 C), Max:98.5 F (36.9 C)  Recent Labs  Lab 02/02/18 1324 02/02/18 1505 02/02/18 2041 02/03/18 0013 02/03/18 0630 02/03/18 1713 02/04/18 0337 02/06/18 0301 02/07/18 0443 02/07/18 0930  WBC 19.1*  --   --   --  14.7* 20.4* 18.1* 8.2  --   --   CREATININE 0.66  --  0.62  --  0.64  --  0.68 0.60 0.99  --   LATICACIDVEN  --  2.53* 1.5 1.1  --   --   --   --   --   --   VANCOTROUGH  --   --   --   --   --   --   --   --   --  11*    Estimated Creatinine Clearance: 37.1 mL/min (by C-G formula based on SCr of 0.99 mg/dL).    Allergies  Allergen Reactions  . Acetaminophen Swelling and Other (See Comments)    Reaction:  Eyelid swelling  . Lisinopril     UNSPECIFIED REACTION      Vanc 5/1 >> Cefepime 5/1 >>  5/6 VT = 11 mcg/mL on [id number] >> incr 1g q24  5/1 BCx - negative 5/1 MRSA PCR: neg 5/1 HIV: non-reactive   Celeste Tavenner D. DaMina MarblePharmD, BCPS, BCCCP Pager:  [phone]/03/2018, 11:06 AM

## 2018-02-07 NOTE — Progress Notes (Signed)
PROGRESS NOTE    [name]  EQA:[id number] DOB: 02/27/1961 DOA: 02/02/2018 PCP: No primary care provider on file.   Brief Narrative:  57 year old female with a history of cocaine abuse, general vasculopathy, bilateral BKA and hypertension came to the hospital with complains of breathing issues.  Upon admission she was found diagnosed with concerns of pneumonia and bilateral right and left stump infection.  Dr. Sharol Given was consulted and patient underwent bilateral lower extremity above-the-knee amputation for concerns of gangrene.   Assessment & Plan:   Principal Problem:   Cocaine use disorder, severe, dependence (HCC) Active Problems:   Essential hypertension   Cocaine-induced vascular disorder (HCC)   MDD (major depressive disorder), recurrent episode, severe ([place])   Protein-calorie malnutrition, severe (HCC)   Atherosclerosis of native arteries of extremities with gangrene, bilateral legs (HCC)   Suicidal ideation   HCAP (healthcare-associated pneumonia)   Acute on chronic respiratory failure with hypoxia ([place])   Sepsis ([place])   Sepsis due to Healthcare acquired pneumonia -Currently patient is on vancomycin and cefepime.  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  Unable to get hold of orthopedic for the phone therefore I sent a message to Dr. Sharol Given to help determine length of antibiotic treatment for the gangrene if if necessary otherwise I will continue antibiotics for short course treatment of H CAP.  Essential hypertension -Continue Norvasc 10 mg daily  Anemia of chronic disease -Status post 2 units PRBC  Chronic diastolic congestive heart failure -Appears to be more euvolemic.  Holding off on Lasix  Major depressive disorder - No longer suicidal or homicidal.  Kennon Holter has  been discontinued after being evaluated by psychiatry -Continue Lexapro and BuSpar  DVT prophylaxis: Lovenox Code Status: Full code Family Communication: Husband at bedside Disposition Plan: To be determined.  Hopefully next 24-48 hours  Consultants:   Orthopedic  Procedures:   Bilateral above-the-knee amputation 5/5     Subjective: Patient denies any complaints besides mild pain at the surgical site.  They are concerned that patient will not be able to go home and be taken care of therefore will require some sort of long-term care placement.  Review of Systems Otherwise negative except as per HPI, including: General: Denies fever, chills, night sweats or unintended weight loss. Resp: Denies cough, wheezing, shortness of breath. Cardiac: Denies chest pain, palpitations, orthopnea, paroxysmal nocturnal dyspnea. GI: Denies abdominal pain, nausea, vomiting, diarrhea or constipation GU: Denies dysuria, frequency, hesitancy or incontinence MS: Denies muscle aches, joint pain or swelling Neuro: Denies headache, neurologic deficits (focal weakness, numbness, tingling), abnormal gait Psych: Denies anxiety, depression, SI/HI/AVH Skin: Denies new rashes or lesions ID: Denies sick contacts, exotic exposures, travel  Objective: Vitals:   02/06/18 0915 02/06/18 1600 02/06/18 1949 02/07/18 0451  BP: (!) 162/93 (!) 159/88 121/73 111/60  Pulse: 97 90 86 74  Resp: 15     Temp:   (!) 97.4 F (36.3 C) 98.5 F (36.9 C)  TempSrc:   Oral Oral  SpO2: 96% 100% 100% 100%  Weight:      Height:        Intake/Output Summary (Last 24 hours) at 02/07/2018 1300 Last data filed at 02/07/2018 0900 Gross per 24 hour  Intake 4614.34 ml  Output 800 ml  Net 3814.34 ml   Filed Weights   02/02/18 1636  Weight: 36.2 kg (  79 lb 12.9 oz)    Examination:  General exam: Appears calm and comfortable  Respiratory system: Clear to auscultation. Respiratory effort normal. Cardiovascular system: S1 & S2  heard, RRR. No JVD, murmurs, rubs, gallops or clicks. No pedal edema. Gastrointestinal system: Abdomen is nondistended, soft and nontender. No organomegaly or masses felt. Normal bowel sounds heard. Central nervous system: Alert and oriented. No focal neurological deficits. Extremities: Amputation noted with wound VAC in place. Skin: No rashes, lesions or ulcers Psychiatry: Judgement and insight appear normal. Mood & affect appropriate.     Data Reviewed:   CBC: Recent Labs  Lab 02/02/18 1324 02/03/18 0630 02/03/18 1713 02/04/18 0337 02/06/18 0301  WBC 19.1* 14.7* 20.4* 18.1* 8.2  NEUTROABS  --   --   --  16.2*  --   HGB 8.3* 6.2* 11.0* 10.2* 10.2*  HCT 25.5* 18.9* 32.5* 30.4* 31.5*  MCV 86.1 90.0 87.8 87.9 89.5  PLT 507* 308 323 280 294   Basic Metabolic Panel: Recent Labs  Lab 02/02/18 1324 02/02/18 2041 02/03/18 0630 02/04/18 0337 02/06/18 0301 02/07/18 0443  NA 143  --  142 139 139  --   K 3.3*  --  3.4* 3.2* 4.1  --   CL 109  --  114* 107 111  --   CO2 22  --  20* 23 20*  --   GLUCOSE 113*  --  113* 119* 87  --   BUN 12  --  13 14 14   --   CREATININE 0.66 0.62 0.64 0.68 0.60 0.99  CALCIUM 9.1  --  7.9* 8.4* 8.4*  --   MG  --   --   --  1.7  --   --    GFR: Estimated Creatinine Clearance: 37.1 mL/min (by C-G formula based on SCr of 0.99 mg/dL). Liver Function Tests: Recent Labs  Lab 02/02/18 1324 02/04/18 0337  AST 26 14*  ALT 10* 9*  ALKPHOS 82 65  BILITOT 0.9 1.3*  PROT 7.5 6.0*  ALBUMIN 3.3* 2.3*   No results for input(s): LIPASE, AMYLASE in the last 168 hours. No results for input(s): AMMONIA in the last 168 hours. Coagulation Profile: Recent Labs  Lab 02/02/18 2041  INR 1.28   Cardiac Enzymes: Recent Labs  Lab 02/02/18 1324  TROPONINI 0.09*   BNP (last 3 results) No results for input(s): PROBNP in the last 8760 hours. HbA1C: No results for input(s): HGBA1C in the last 72 hours. CBG: Recent Labs  Lab 02/05/18 1612 02/06/18 1219    GLUCAP 106* 211*   Lipid Profile: No results for input(s): CHOL, HDL, LDLCALC, TRIG, CHOLHDL, LDLDIRECT in the last 72 hours. Thyroid Function Tests: No results for input(s): TSH, T4TOTAL, FREET4, T3FREE, THYROIDAB in the last 72 hours. Anemia Panel: No results for input(s): VITAMINB12, FOLATE, FERRITIN, TIBC, IRON, RETICCTPCT in the last 72 hours. Sepsis Labs: Recent Labs  Lab 02/02/18 1505 02/02/18 2041 02/03/18 0013 02/04/18 0337  PROCALCITON  --  0.17  --  0.60  LATICACIDVEN 2.53* 1.5 1.1  --     Recent Results (from the past 240 hour(s))  Blood culture (routine x 2)     Status: None   Collection Time: 02/02/18  2:55 PM  Result Value Ref Range Status   Specimen Description   Final    BLOOD LEFT ARM Performed at [facility] [address], [place], [place] [address]    Special Requests   Final    BOTTLES DRAWN AEROBIC AND  ANAEROBIC Blood Culture adequate volume Performed at [facility] [address] [place], [place] [address]    Culture   Final    NO GROWTH 5 DAYS Performed at [facility], [place] [address], [place], [place] [address]    Report Status 02/07/2018 FINAL  Final  Blood culture (routine x 2)     Status: None   Collection Time: 02/02/18  2:55 PM  Result Value Ref Range Status   Specimen Description   Final    BLOOD RIGHT HAND Performed at [facility] [address], [place], [place] [address]    Special Requests   Final    BOTTLES DRAWN AEROBIC AND ANAEROBIC Blood Culture adequate volume Performed at [facility] [address] [place], [place] [address]    Culture   Final    NO GROWTH 5 DAYS Performed at [facility], [place] [address] [place], [place] [address]    Report Status 02/07/2018 FINAL  Final  MRSA PCR Screening     Status: None   Collection Time: 02/02/18  4:38 PM  Result Value Ref Range Status   MRSA by PCR NEGATIVE NEGATIVE Final     Comment:        The GeneXpert MRSA Assay (FDA approved for NASAL specimens only), is one component of a comprehensive MRSA colonization surveillance program. It is not intended to diagnose MRSA infection nor to guide or monitor treatment for MRSA infections. Performed at [facility], [place] [address], [place],  [address]          Radiology Studies: No results found.      Scheduled Meds: . amLODipine  10 mg Oral Daily  . busPIRone  15 mg Oral TID  . docusate sodium  100 mg Oral BID  . enoxaparin (LOVENOX) injection  30 mg Subcutaneous Q24H  . escitalopram  20 mg Oral QHS  . feeding supplement (ENSURE ENLIVE)  237 mL Oral BID BM  . multivitamin with minerals  1 tablet Oral Daily   Continuous Infusions: . sodium chloride 50 mL/hr at 02/04/18 1651  . sodium chloride    . sodium chloride 10 mL/hr at 02/06/18 1044  . ceFEPime (MAXIPIME) IV Stopped (02/07/18 0710)  . methocarbamol (ROBAXIN)  IV    . [START ON 02/08/2018] vancomycin       LOS: 5 days    I have spent 35 minutes face to face with the patient and on the ward discussing the patients care, assessment, plan and disposition with other care givers. >50% of the time was devoted counseling the patient about the risks and benefits of treatment and coordinating care.     Jahara Dail Arsenio Loader, MD [facility] Pager [phone]   If 7PM-7AM, please contact night-coverage [email or web address] Password [email or web address] 02/07/2018, 1:00 PM

## 2018-02-08 MED ORDER — LEVOFLOXACIN 500 MG PO TABS
500.0000 mg | ORAL_TABLET | Freq: Every day | ORAL | Status: AC
  Administered 2018-02-09 – 2018-02-13 (×5): 500 mg via ORAL
  Filled 2018-02-08 (×5): qty 1

## 2018-02-08 MED ORDER — LEVOFLOXACIN IN D5W 500 MG/100ML IV SOLN: 500.0000 mg | INTRAVENOUS | Status: DC

## 2018-02-08 NOTE — Social Work (Signed)
CSW started FL2 form.   Due to patients substance use, she may be a difficult to place in a facility.  CSW communicated with therapist as an Evaluation needed to move forward with placement.  CSW will continue to follow at PT evaluation is pending.  Elissa Hefty, LCSW Clinical Social Worker [phone]

## 2018-02-08 NOTE — Progress Notes (Signed)
PROGRESS NOTE    [name]  KVQ:[id number] DOB: Feb 15, 1961 DOA: 02/02/2018 PCP: No primary care provider on file.   Brief Narrative:  57 year old female with a history of cocaine abuse, general vasculopathy, bilateral BKA and hypertension came to the hospital with complains of breathing issues.  Upon admission she was found diagnosed with concerns of pneumonia and bilateral right and left stump infection.  Dr. Sharol Given was consulted and patient underwent bilateral lower extremity above-the-knee amputation for concerns of gangrene.  Currently patient has wound VAC in place and undergoing PT eval for long-term placement.   Assessment & Plan:   Principal Problem:   Cocaine use disorder, severe, dependence (HCC) Active Problems:   Essential hypertension   Cocaine-induced vascular disorder (HCC)   MDD (major depressive disorder), recurrent episode, severe ([place])   Protein-calorie malnutrition, severe (HCC)   Atherosclerosis of native arteries of extremities with gangrene, bilateral legs (HCC)   Suicidal ideation   HCAP (healthcare-associated pneumonia)   Acute on chronic respiratory failure with hypoxia ([place])   Sepsis ([place])   Sepsis due to Healthcare acquired pneumonia -We will stop her vancomycin and cefepime.  Switch her over to oral Levaquin. -Continue nebulizer treatments -Supplemental oxygen as necessary  Bilateral lower extremity gangrene -Patient is status post above-the-knee amputation bilaterally 5/5 and currently wound VAC in place -From postsurgical standpoint, postop 24 hours antibiotics can be stopped.  Appreciate input from Dr. Sharol Given.  Essential hypertension -Continue Norvasc 10 mg daily  Anemia of chronic disease -Status post 2 units PRBC  Chronic diastolic congestive heart failure -Appears to be more euvolemic.  Holding off on Lasix  Major depressive disorder - No longer suicidal or homicidal.  Kennon Holter has been discontinued after being evaluated by  psychiatry -Continue Lexapro and BuSpar  DVT prophylaxis: Lovenox Code Status: Full code Family Communication: Husband at bedside Disposition Plan: Long-term placement plan in place.  Social worker where he was working on this.  Consultants:   Orthopedic  Procedures:   Bilateral above-the-knee amputation 5/5   Subjective: No complaints this morning.  Remains afebrile, no acute events overnight.  Review of Systems Otherwise negative except as per HPI, including: HEENT/EYES = negative for pain, redness, loss of vision, double vision, blurred vision, loss of hearing, sore throat, hoarseness, dysphagia Cardiovascular= negative for chest pain, palpitation, murmurs, lower extremity swelling Respiratory/lungs= negative for shortness of breath, cough, hemoptysis, wheezing, mucus production Gastrointestinal= negative for nausea, vomiting,, abdominal pain, melena, hematemesis Genitourinary= negative for Dysuria, Hematuria, Change in Urinary Frequency MSK = Negative for arthralgia, myalgias, Back Pain, Joint swelling  Neurology= Negative for headache, seizures, numbness, tingling  Psychiatry= Negative for anxiety, depression, suicidal and homocidal ideation Allergy/Immunology= Medication/Food allergy as listed  Skin= Negative for Rash, lesions, ulcers, itching   Objective: Vitals:   02/07/18 1401 02/07/18 1424 02/07/18 2037 02/07/18 2053  BP:  (!) 101/58 103/64   Pulse:  75 81   Resp:  16 16   Temp:  97.8 F (36.6 C) 98.7 F (37.1 C)   TempSrc:  Oral Oral   SpO2: 100% 100% 100% 100%  Weight:      Height:        Intake/Output Summary (Last 24 hours) at 02/08/2018 1134 Last data filed at 02/08/2018 0900 Gross per 24 hour  Intake 360 ml  Output 1000 ml  Net -640 ml   Filed Weights   02/02/18 1636  Weight: 36.2 kg (79 lb 12.9 oz)    Examination: Constitutional: NAD, calm, comfortable Eyes: PERRL, lids and  conjunctivae normal ENMT: Mucous membranes are moist. Posterior  pharynx clear of any exudate or lesions.Normal dentition.  Neck: normal, supple, no masses, no thyromegaly Respiratory: clear to auscultation bilaterally, no wheezing, no crackles. Normal respiratory effort. No accessory muscle use.  Cardiovascular: Regular rate and rhythm, no murmurs / rubs / gallops. No extremity edema. 2+ pedal pulses. No carotid bruits.  Abdomen: no tenderness, no masses palpated. No hepatosplenomegaly. Bowel sounds positive.  Musculoskeletal: Bilateral lower extremity amputations  noted with wound VAC in place Skin: no rashes, lesions, ulcers. No induration Neurologic: CN 2-12 grossly intact. Sensation intact, DTR normal. Strength 5/5 in all 4.  Psychiatric: Normal judgment and insight. Alert and oriented x 3. Normal mood.    Data Reviewed:   CBC: Recent Labs  Lab 02/02/18 1324 02/03/18 0630 02/03/18 1713 02/04/18 0337 02/06/18 0301  WBC 19.1* 14.7* 20.4* 18.1* 8.2  NEUTROABS  --   --   --  16.2*  --   HGB 8.3* 6.2* 11.0* 10.2* 10.2*  HCT 25.5* 18.9* 32.5* 30.4* 31.5*  MCV 86.1 90.0 87.8 87.9 89.5  PLT 507* 308 323 280 998   Basic Metabolic Panel: Recent Labs  Lab 02/02/18 1324 02/02/18 2041 02/03/18 0630 02/04/18 0337 02/06/18 0301 02/07/18 0443  NA 143  --  142 139 139  --   K 3.3*  --  3.4* 3.2* 4.1  --   CL 109  --  114* 107 111  --   CO2 22  --  20* 23 20*  --   GLUCOSE 113*  --  113* 119* 87  --   BUN 12  --  13 14 14   --   CREATININE 0.66 0.62 0.64 0.68 0.60 0.99  CALCIUM 9.1  --  7.9* 8.4* 8.4*  --   MG  --   --   --  1.7  --   --    GFR: Estimated Creatinine Clearance: 37.1 mL/min (by C-G formula based on SCr of 0.99 mg/dL). Liver Function Tests: Recent Labs  Lab 02/02/18 1324 02/04/18 0337  AST 26 14*  ALT 10* 9*  ALKPHOS 82 65  BILITOT 0.9 1.3*  PROT 7.5 6.0*  ALBUMIN 3.3* 2.3*   No results for input(s): LIPASE, AMYLASE in the last 168 hours. No results for input(s): AMMONIA in the last 168 hours. Coagulation  Profile: Recent Labs  Lab 02/02/18 2041  INR 1.28   Cardiac Enzymes: Recent Labs  Lab 02/02/18 1324  TROPONINI 0.09*   BNP (last 3 results) No results for input(s): PROBNP in the last 8760 hours. HbA1C: No results for input(s): HGBA1C in the last 72 hours. CBG: Recent Labs  Lab 02/05/18 1612 02/06/18 1219  GLUCAP 106* 211*   Lipid Profile: No results for input(s): CHOL, HDL, LDLCALC, TRIG, CHOLHDL, LDLDIRECT in the last 72 hours. Thyroid Function Tests: No results for input(s): TSH, T4TOTAL, FREET4, T3FREE, THYROIDAB in the last 72 hours. Anemia Panel: No results for input(s): VITAMINB12, FOLATE, FERRITIN, TIBC, IRON, RETICCTPCT in the last 72 hours. Sepsis Labs: Recent Labs  Lab 02/02/18 1505 02/02/18 2041 02/03/18 0013 02/04/18 0337  PROCALCITON  --  0.17  --  0.60  LATICACIDVEN 2.53* 1.5 1.1  --     Recent Results (from the past 240 hour(s))  Blood culture (routine x 2)     Status: None   Collection Time: 02/02/18  2:55 PM  Result Value Ref Range Status   Specimen Description   Final    BLOOD LEFT ARM Performed at  [facility], [place] [address] [place], [place] [address]    Special Requests   Final    BOTTLES DRAWN AEROBIC AND ANAEROBIC Blood Culture adequate volume Performed at [facility] [address] [place], [place] [address]    Culture   Final    NO GROWTH 5 DAYS Performed at [facility], [place] [address] [place], [place] [address]    Report Status 02/07/2018 FINAL  Final  Blood culture (routine x 2)     Status: None   Collection Time: 02/02/18  2:55 PM  Result Value Ref Range Status   Specimen Description   Final    BLOOD RIGHT HAND Performed at [facility] [address] [place], [place] [address]    Special Requests   Final    BOTTLES DRAWN AEROBIC AND ANAEROBIC Blood Culture adequate volume Performed at [facility] [address], [place],  [place] [address]    Culture   Final    NO GROWTH 5 DAYS Performed at [facility], [place] [address] [place], [place] [address]    Report Status 02/07/2018 FINAL  Final  MRSA PCR Screening     Status: None   Collection Time: 02/02/18  4:38 PM  Result Value Ref Range Status   MRSA by PCR NEGATIVE NEGATIVE Final    Comment:        The GeneXpert MRSA Assay (FDA approved for NASAL specimens only), is one component of a comprehensive MRSA colonization surveillance program. It is not intended to diagnose MRSA infection nor to guide or monitor treatment for MRSA infections. Performed at [facility], [place] [address] [place], [place] [address]          Radiology Studies: No results found.      Scheduled Meds: . amLODipine  10 mg Oral Daily  . busPIRone  15 mg Oral TID  . docusate sodium  100 mg Oral BID  . enoxaparin (LOVENOX) injection  30 mg Subcutaneous Q24H  . escitalopram  20 mg Oral QHS  . feeding supplement (ENSURE ENLIVE)  237 mL Oral BID BM  . multivitamin with minerals  1 tablet Oral Daily   Continuous Infusions: . sodium chloride 50 mL/hr at 02/04/18 1651  . sodium chloride    . sodium chloride 10 mL/hr at 02/06/18 1044  . ceFEPime (MAXIPIME) IV Stopped (02/08/18 0745)  . methocarbamol (ROBAXIN)  IV    . vancomycin Stopped (02/08/18 1009)     LOS: 6 days    I have spent 35 minutes face to face with the patient and on the ward discussing the patients care, assessment, plan and disposition with other care givers. >50% of the time was devoted counseling the patient about the risks and benefits of treatment and coordinating care.     Ankit Arsenio Loader, MD [facility] Pager [phone]   If 7PM-7AM, please contact night-coverage [email or web address] Password [facility] 02/08/2018, 11:34 AM

## 2018-02-08 NOTE — Clinical Social Work Note (Signed)
Clinical Social Work Assessment  Patient Details  Name: [name] MRN: [id number] Date of Birth: May 08, 1961  Date of referral:  02/08/18               Reason for consult:  Facility Placement                Permission sought to share information with:  Facility Sport and exercise psychologist Permission granted to share information::  Yes, Verbal Permission Granted  Name::     Ailene Ards, son, [phone]  Agency::  SNF  Relationship::  son  Contact Information:     Housing/Transportation Living arrangements for the past 2 months:  Single Family Home Source of Information:  Patient Patient Interpreter Needed:  None Criminal Activity/Legal Involvement Pertinent to Current Situation/Hospitalization:  No - Comment as needed Significant Relationships:  Other Family Members, Friend Lives with:  Siblings Do you feel safe going back to the place where you live?  No Need for family participation in patient care:  Yes (Comment)  Care giving concerns:  Pt resided at home with family and now has bilateral AKA's. Clinical team is recommending rehabilitative therapies before returning home.   Social Worker assessment / plan:  CSW met with patient at bedside to discuss disposition plan.  Pt resided at home with brother prior to hospitalization.  She has an aid 3 days a week that assisted with ADL's. Pt has current substance use history. Pt indicated that she has been in treatment before. No s/i or h/i. She confirms that her main support is her brother. She denies spouse/partner or children. Pt indicated that she is agreeable top SNF at discharge. CSW discussed the SNF placement, process and barriers. Pt agreeable to same and would like to be in [employer] CSW advised that she needs to work with OT/PT so that they can evaluate her. Pt agreed to same.  Employment status:  Disabled (Comment on whether or not currently receiving Disability) Insurance information:  Medicaid In [place] PT  Recommendations:  [place] / Referral to community resources:  Organ  Patient/Family's Response to care:  Patient appreciative of CSW assistance and she is agreeable to SNF at discharge. No issues or concerns identified.  Patient/Family's Understanding of and Emotional Response to Diagnosis, Current Treatment, and Prognosis:  Pt has good understanding of her diagosis and her mood appears stable given her new impairment. CSW met with patient at bedside to provide support. Pt engaged appropriately and appears to be adjusting to current physical state. CSW awaiting on OT to evaluate patient. CSW plans to assist patient with SNF placement and patient agreeable to same at discharge.  CSW will continue to follow up for disposition.  Emotional Assessment Appearance:  Appears stated age Attitude/Demeanor/Rapport:  (Cooperative) Affect (typically observed):  Accepting, Appropriate Orientation:  Oriented to Self, Oriented to Place, Oriented to  Time, Oriented to Situation Alcohol / Substance use:  Not Applicable Psych involvement (Current and /or in the community):  No (Comment)  Discharge Needs  Concerns to be addressed:  Discharge Planning Concerns Readmission within the last 30 days:  Yes Current discharge risk:  Dependent with Mobility, Physical Impairment Barriers to Discharge:  No Barriers Identified   Normajean Baxter, LCSW 02/08/2018, 2:11 PM

## 2018-02-08 NOTE — Evaluation (Signed)
Physical Therapy Evaluation Patient Details Name: [name] MRN: [id number] DOB: April 15, 1961 Today's Date: 02/08/2018   History of Present Illness  57 y.o. female admitted on 02/02/18 for bilateral leg pain and trouble breathing.  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  Pt with significant PMH of vasculitis, anemia, migraines, inflammatory arthritis, cocaine abuse, multiple bil amputations of LEs.    Clinical Impression  Pt was able to anterior posterior transfer OOB to chair with two person min assist for safety.  She is really hurting.  Post transfemoral amputation education initiated and pt would benefit from SNF level rehab before returning home with family's assist.      Follow Up Recommendations SNF    Equipment Recommendations  None recommended by PT    Recommendations for Other Services   NA    Precautions / Restrictions Precautions Precautions: Fall Restrictions RLE Weight Bearing: Non weight bearing LLE Weight Bearing: Non weight bearing      Mobility  Bed Mobility Overal bed mobility: Needs Assistance Bed Mobility: Supine to Sit     Supine to sit: Min assist;HOB elevated     General bed mobility comments: Supine to long sit in bed from elevated HOB min assist.   Transfers Overall transfer level: Needs assistance   Transfers: Comptroller transfers: +2 physical assistance;Min assist   General transfer comment: Two person min assist mostly to keep her pad under her bottom to help boost her back from recliner chair into the bed with RN staff later.  Min assist mostly for balance at trunk and for slight lift at buttocks to scoot.  Pt grimacing throughout movement.          Balance Overall balance assessment: Needs assistance Sitting-balance support: Feet unsupported;Bilateral upper extremity  supported Sitting balance-Leahy Scale: Fair Sitting balance - Comments: bil UE prop in static sitting close supervision, anything dynamic requires min assist.                                      Pertinent Vitals/Pain Pain Assessment: Faces Faces Pain Scale: Hurts whole lot Pain Location: bil residual limbs Pain Descriptors / Indicators: Aching;Burning;Grimacing;Guarding Pain Intervention(s): Limited activity within patient's tolerance;Monitored during session;Repositioned    Home Living Family/patient expects to be discharged to:: Skilled nursing facility                      Prior Function Level of Independence: Independent with assistive device(s)         Comments: per pt report she scooted into WC mostly, was waiting on LE wounds to heal to get bil prostheses.      Hand Dominance   Dominant Hand: Right    Extremity/Trunk Assessment   Upper Extremity Assessment Upper Extremity Assessment: Overall WFL for tasks assessed    Lower Extremity Assessment Lower Extremity Assessment: RLE deficits/detail;LLE deficits/detail RLE Deficits / Details: bil LE post op AKA, pt keeps her right leg more flexed than her left and cannot currently tell unless she visually looks when it is touching the bed.   RLE: Unable to fully assess due to pain LLE Deficits / Details: bil LE post op AKA, pt keeps her right leg more flexed than her left and cannot  currently tell unless she visually looks when it is touching the bed.   LLE: Unable to fully assess due to pain    Cervical / Trunk Assessment Cervical / Trunk Assessment: Normal  Communication   Communication: No difficulties  Cognition Arousal/Alertness: Awake/alert Behavior During Therapy: WFL for tasks assessed/performed Overall Cognitive Status: Within Functional Limits for tasks assessed                                        General Comments General comments (skin integrity, edema, etc.):  Educated on importance of keeping legs flat to prevent bil hip flexion contractures.  She did not want to attempt sidelying extension exercises today due to pain, but was willing to work on lowering both legs down (as they were both up on pillows) flat to the bed and flat to the chair after transfer.         Assessment/Plan    PT Assessment Patient needs continued PT services  PT Problem List Decreased strength;Decreased range of motion;Decreased activity tolerance;Decreased balance;Decreased mobility;Decreased knowledge of use of DME;Decreased knowledge of precautions;Pain       PT Treatment Interventions DME instruction;Functional mobility training;Therapeutic activities;Therapeutic exercise;Balance training;Patient/family education;Wheelchair mobility training;Modalities    PT Goals (Current goals can be found in the Care Plan section)  Acute Rehab PT Goals Patient Stated Goal: to go to rehab and get healed up so she can get prostheses PT Goal Formulation: With patient Time For Goal Achievement: 02/22/18 Potential to Achieve Goals: Good    Frequency Min 2X/week   Barriers to discharge Inaccessible home environment has 2-3 steps to enter her home.        AM-PAC PT "6 Clicks" Daily Activity  Outcome Measure Difficulty turning over in bed (including adjusting bedclothes, sheets and blankets)?: Unable Difficulty moving from lying on back to sitting on the side of the bed? : Unable Difficulty sitting down on and standing up from a chair with arms (e.g., wheelchair, bedside commode, etc,.)?: Unable Help needed moving to and from a bed to chair (including a wheelchair)?: A Little Help needed walking in hospital room?: Total Help needed climbing 3-5 steps with a railing? : Total 6 Click Score: 8    End of Session   Activity Tolerance: Patient limited by pain Patient left: in chair;with call bell/phone within reach   PT Visit Diagnosis: Muscle weakness (generalized)  (M62.81);Pain Pain - Right/Left: (both) Pain - part of body: Leg(legs bil)    Time: 7622-6333 PT Time Calculation (min) (ACUTE ONLY): 16 min   Charges:         Wells Guiles B. Xian Apostol, PT, DPT [phone]   PT Evaluation $PT Eval Moderate Complexity: 1 Mod     02/08/2018, 3:12 PM

## 2018-02-09 LAB — CREATININE, SERUM: Creatinine, Ser: 0.94 mg/dL (ref 0.44–1.00)

## 2018-02-09 NOTE — Progress Notes (Signed)
Patient Demographics:    [name], is a 57 y.o. female, DOB - Sep 07, 1961, RNH:[id number]  Admit date - 02/02/2018   Admitting Physician Annita Brod, MD  Outpatient Primary MD for the patient is No primary care provider on file.  LOS - 7   Chief Complaint  Patient presents with  . Shortness of Breath        Subjective:    [name] today has no fevers, no emesis,  No chest pain, no new complaints  Assessment  & Plan :    Principal Problem:   Cocaine use disorder, severe, dependence (HCC) Active Problems:   Essential hypertension   Cocaine-induced vascular disorder (HCC)   MDD (major depressive disorder), recurrent episode, severe (HCC)   Protein-calorie malnutrition, severe (HCC)   Atherosclerosis of native arteries of extremities with gangrene, bilateral legs (HCC)   Suicidal ideation   HCAP (healthcare-associated pneumonia)   Acute on chronic respiratory failure with hypoxia (HCC)   Sepsis ([place])  Brief Narrative:  57 year old female with a history of cocaine abuse, general vasculopathy, bilateral BKA and hypertension came to the hospital with complains of breathing issues.  Upon admission she was found diagnosed with concerns of pneumonia and bilateral right and left stump infection.  Dr. 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  Currently patient has wound VAC in place and awaiting SNF    Plan:- 1)Sepsis secondary to HCAP-clinically much improved, treated with Vanco and cefepime, no significant hypoxia at this time, transition to p.o. Levaquin on 02/09/2018 for total of 5 days,  2)HTN-stable, continue Norvasc 10 mg daily  3)Acute on chronic anemia-patient has anemia of chronic disease, H&H has been stable since transfusion of 2 packs of packed red blood cells on 02/03/2018  4)HFpEF-clinically  stable, appears euvolemic, last known EF 55 to 60%, at baseline patient has chronic diastolic dysfunction CHF.  Avoid aggressive diuresis at this time  5) depression-psychiatric eval appreciated, no suicidal homicidal ideation at this time, continue Lexapro and BuSpar  6) bilateral lower extremity gangrene-status post bilateral AKA on 02/06/2018, wound VAC in place,.  Dr. Sharol Given following  Code Status : Full   Disposition Plan  : SNF  Consults  :  Ortho Sharol Given)  DVT Prophylaxis  :  Lovenox   Lab Results  Component Value Date   PLT 301 02/06/2018    Inpatient Medications  Scheduled Meds: . amLODipine  10 mg Oral Daily  . busPIRone  15 mg Oral TID  . docusate sodium  100 mg Oral BID  . enoxaparin (LOVENOX) injection  30 mg Subcutaneous Q24H  . escitalopram  20 mg Oral QHS  . feeding supplement (ENSURE ENLIVE)  237 mL Oral BID BM  . levofloxacin  500 mg Oral Daily  . multivitamin with minerals  1 tablet Oral Daily   Continuous Infusions: . sodium chloride 50 mL/hr at 02/04/18 1651  . sodium chloride    . sodium chloride 10 mL/hr at 02/06/18 1044  . methocarbamol (ROBAXIN)  IV     PRN Meds:.acetaminophen, alum & mag hydroxide-simeth, bisacodyl, HYDROmorphone (DILAUDID) injection, ibuprofen, ipratropium-albuterol, magnesium citrate, methocarbamol **OR** methocarbamol (ROBAXIN)  IV, metoCLOPramide **OR** metoCLOPramide (REGLAN) injection, morphine injection, ondansetron **OR** ondansetron (  ZOFRAN) IV, oxyCODONE, oxyCODONE, polyethylene glycol, zolpidem    Anti-infectives (From admission, onward)   Start     Dose/Rate Route Frequency Ordered Stop   02/09/18 1000  levofloxacin (LEVAQUIN) tablet 500 mg    Note to Pharmacy:  Dr switching pt over to Levaquin 500 mg oral per note.   500 mg Oral Daily 02/08/18 1349 02/14/18 0959   02/08/18 1145  levofloxacin (LEVAQUIN) IVPB 500 mg  Status:  Discontinued     500 mg 100 mL/hr over 60 Minutes Intravenous Every 24 hours 02/08/18 1142  02/08/18 1351   02/08/18 0900  vancomycin (VANCOCIN) IVPB 1000 mg/200 mL premix  Status:  Discontinued     1,000 mg 200 mL/hr over 60 Minutes Intravenous Every 24 hours 02/07/18 1107 02/08/18 1142   02/07/18 0600  ceFEPIme (MAXIPIME) 1 g in sodium chloride 0.9 % 100 mL IVPB  Status:  Discontinued     1 g 200 mL/hr over 30 Minutes Intravenous Every 24 hours 02/06/18 1442 02/08/18 1142   02/06/18 0630  ceFAZolin (ANCEF) IVPB 2g/100 mL premix  Status:  Discontinued     2 g 200 mL/hr over 30 Minutes Intravenous To [facility] 02/05/18 1035 02/06/18 0925   02/05/18 1015  ceFAZolin (ANCEF) IVPB 2g/100 mL premix     2 g 200 mL/hr over 30 Minutes Intravenous On call to O.R. 02/05/18 1011 02/06/18 0559   02/03/18 1800  vancomycin (VANCOCIN) 500 mg in sodium chloride 0.9 % 100 mL IVPB  Status:  Discontinued     500 mg 100 mL/hr over 60 Minutes Intravenous Every 24 hours 02/02/18 1847 02/02/18 1858   02/03/18 1000  vancomycin (VANCOCIN) IVPB 750 mg/150 ml premix  Status:  Discontinued     750 mg 150 mL/hr over 60 Minutes Intravenous Every 24 hours 02/02/18 1858 02/07/18 1107   02/02/18 2200  ceFEPIme (MAXIPIME) 1 g in sodium chloride 0.9 % 100 mL IVPB  Status:  Discontinued     1 g 200 mL/hr over 30 Minutes Intravenous Every 12 hours 02/02/18 1847 02/02/18 1858   02/02/18 2200  ceFEPIme (MAXIPIME) 1 g in sodium chloride 0.9 % 100 mL IVPB  Status:  Discontinued     1 g 200 mL/hr over 30 Minutes Intravenous Every 8 hours 02/02/18 1858 02/06/18 1442   02/02/18 1845  ceFEPIme (MAXIPIME) 2 g in sodium chloride 0.9 % 100 mL IVPB  Status:  Discontinued     2 g 200 mL/hr over 30 Minutes Intravenous  Once 02/02/18 1830 02/02/18 1847   02/02/18 1845  vancomycin (VANCOCIN) IVPB 1000 mg/200 mL premix  Status:  Discontinued     1,000 mg 200 mL/hr over 60 Minutes Intravenous  Once 02/02/18 1830 02/02/18 1847   02/02/18 1500  vancomycin (VANCOCIN) IVPB 1000 mg/200 mL premix     1,000 mg 200 mL/hr over  60 Minutes Intravenous  Once 02/02/18 1445 02/02/18 1647   02/02/18 1500  piperacillin-tazobactam (ZOSYN) IVPB 3.375 g     3.375 g 100 mL/hr over 30 Minutes Intravenous  Once 02/02/18 1445 02/02/18 1546        Objective:   Vitals:   02/08/18 2038 02/09/18 0436 02/09/18 1325 02/09/18 1327  BP: (!) 92/57 (!) 103/57 111/64 111/68  Pulse: 72 73 78 75  Resp: 16 16  16   Temp: 98.4 F (36.9 C) 98.3 F (36.8 C) 98.7 F (37.1 C) 98.2 F (36.8 C)  TempSrc: Oral Oral Oral Oral  SpO2: 95% 98% 100% 100%  Weight:  Height:        Wt Readings from Last 3 Encounters:  02/02/18 36.2 kg (79 lb 12.9 oz)  01/23/18 44.5 kg (98 lb)  01/20/18 45.4 kg (100 lb)     Intake/Output Summary (Last 24 hours) at 02/09/2018 1824 Last data filed at 02/09/2018 0830 Gross per 24 hour  Intake 240 ml  Output -  Net 240 ml     Physical Exam  Gen:- Awake Alert, frail and cachectic appearing HEENT:- [place].AT, No sclera icterus Neck-Supple Neck,No JVD,.  Lungs-  CTAB , good air movement CV- S1, S2 normal Abd-  +ve B.Sounds, Abd Soft, No tenderness,    Extremity/Skin:-Bilateral AKA with wound VAC in situ Psych-affect is appropriate, oriented x3 Neuro-no new focal deficits, no tremors   Data Review:   Micro Results Recent Results (from the past 240 hour(s))  Blood culture (routine x 2)     Status: None   Collection Time: 02/02/18  2:55 PM  Result Value Ref Range Status   Specimen Description   Final    BLOOD LEFT ARM Performed at [facility] [address], [place], [place] [address]    Special Requests   Final    BOTTLES DRAWN AEROBIC AND ANAEROBIC Blood Culture adequate volume Performed at [facility] [address], [place], [place] [address]    Culture   Final    NO GROWTH 5 DAYS Performed at [facility], [place] [address] [place], [place] [address]    Report Status 02/07/2018 FINAL  Final  Blood culture (routine x 2)     Status: None    Collection Time: 02/02/18  2:55 PM  Result Value Ref Range Status   Specimen Description   Final    BLOOD RIGHT HAND Performed at [facility] [address] [place], [place] [address]    Special Requests   Final    BOTTLES DRAWN AEROBIC AND ANAEROBIC Blood Culture adequate volume Performed at [facility] [address], [place], [place] [address]    Culture   Final    NO GROWTH 5 DAYS Performed at [facility], [place] [address] [place], [place] [address]    Report Status 02/07/2018 FINAL  Final  MRSA PCR Screening     Status: None   Collection Time: 02/02/18  4:38 PM  Result Value Ref Range Status   MRSA by PCR NEGATIVE NEGATIVE Final    Comment:        The GeneXpert MRSA Assay (FDA approved for NASAL specimens only), is one component of a comprehensive MRSA colonization surveillance program. It is not intended to diagnose MRSA infection nor to guide or monitor treatment for MRSA infections. Performed at [facility], [place] [address] [place], [place] [address]     Radiology Reports Dg Chest 2 View  Addendum Date: 02/02/2018   ADDENDUM REPORT: 02/02/2018 14:33 ADDENDUM: Acute findings discussed with and reconfirmed by Dr.KEVIN CAMPOS on 02/02/2018 at 2:32 pm. Electronically Signed   By: Elon Alas M.D.   On: 02/02/2018 14:33   Result Date: 02/02/2018 CLINICAL DATA:  Mid chest pain, cough and congestion. EXAM: CHEST - 2 VIEW COMPARISON:  Chest radiograph January 20, 2018 FINDINGS: Multifocal RIGHT lung consolidation. Mild chronic interstitial changes without pleural effusion. Borderline cardiomegaly. Calcified aortic knob. No pneumothorax. Soft tissue planes included osseous structures are nonsuspicious. IMPRESSION: 1. Multifocal RIGHT pneumonia. 2. Borderline cardiomegaly. 3.  Aortic Atherosclerosis (ICD10-I70.0). Electronically Signed: By: Sandie Ano  Bloomer M.D. On: 02/02/2018 14:28   Dg Chest 2 View  Result  Date: 01/20/2018 CLINICAL DATA:  Shortness of breath with cough and congestion EXAM: CHEST - 2 VIEW COMPARISON:  October 23, 2017 FINDINGS: There is scarring in the right upper lobe, stable. There is no edema or consolidation. Heart size and pulmonary vascularity are normal. No adenopathy. There is aortic atherosclerosis. No evident bone lesions. IMPRESSION: Mild scarring right upper lobe, stable. No edema or consolidation. Stable cardiac silhouette. There is aortic atherosclerosis. Aortic Atherosclerosis (ICD10-I70.0). Electronically Signed   By: Lowella Grip III M.D.   On: 01/20/2018 09:51   Dg Knee 1-2 Views Left  Result Date: 02/02/2018 CLINICAL DATA:  Knee pain.  Status post BKA. EXAM: LEFT KNEE - 1-2 VIEW COMPARISON:  01/20/2018 FINDINGS: Bones are demineralized. No evidence for an acute fracture. No subluxation or dislocation. No worrisome lytic or sclerotic osseous abnormality. No gross joint effusion evident. There is some soft tissue irregularity at the stump. IMPRESSION: Osteopenia without acute bony abnormality. Soft tissue irregularity at the stump may be postoperative scarring although wound/ulcer could have this appearance. Electronically Signed   By: Misty Stanley M.D.   On: 02/02/2018 14:30   Dg Knee 1-2 Views Right  Result Date: 02/02/2018 CLINICAL DATA:  Pain and necrosis at stump, bleeding. EXAM: RIGHT KNEE - 1-2 VIEW COMPARISON:  RIGHT knee radiograph January 20, 2018 FINDINGS: Status post below-knee amputation. No acute osseous process. No rib fracture deformity or dislocation. No advanced degenerative change for age. Increasing soft tissue swelling with superficial irregularity and subcutaneous gas about the stump. IMPRESSION: Soft tissue swelling with new subcutaneous gas seen with necrotizing fasciitis or recent instrumentation. No acute osseous process. Acute findings discussed with and reconfirmed by Dr.KEVIN CAMPOS on 02/02/2018 at 2:32 pm. Electronically Signed   By: Elon Alas M.D.   On: 02/02/2018 14:32   Dg Knee Complete 4 Views Left  Result Date: 01/20/2018 CLINICAL DATA:  Nonhealing wound at the left leg stump. EXAM: LEFT KNEE - COMPLETE 4+ VIEW COMPARISON:  12/17/2017 FINDINGS: Patient has a below the knee amputation. Stable appearance of the remaining tibia and fibula. There is no evidence for cortical irregularity or periosteal reaction at the amputation site. There is lucency and irregularity in the soft tissues at the stump. Left knee is located. IMPRESSION: No acute bone abnormality involving the amputation site. No radiographic findings for osteomyelitis. Irregularity involving the soft tissues compatible with the clinical history. Electronically Signed   By: Markus Daft M.D.   On: 01/20/2018 09:54   Dg Knee Complete 4 Views Right  Result Date: 01/20/2018 CLINICAL DATA:  Nonhealing wound at left stump EXAM: RIGHT KNEE - COMPLETE 4+ VIEW COMPARISON:  12/17/2017 FINDINGS: Prior below the knee amputation on the right. No bony changes to suggest osteomyelitis. No soft tissue gas. No acute bony abnormality. IMPRESSION: Right BKA.  No radiographic changes of osteomyelitis. Electronically Signed   By: Rolm Baptise M.D.   On: 01/20/2018 09:50     CBC Recent Labs  Lab 02/03/18 0630 02/03/18 1713 02/04/18 0337 02/06/18 0301  WBC 14.7* 20.4* 18.1* 8.2  HGB 6.2* 11.0* 10.2* 10.2*  HCT 18.9* 32.5* 30.4* 31.5*  PLT 308 323 280 301  MCV 90.0 87.8 87.9 89.5  MCH 29.5 29.7 29.5 29.0  MCHC 32.8 33.8 33.6 32.4  RDW 21.7* 17.2* 17.7* 17.8*  LYMPHSABS  --   --  1.3  --   MONOABS  --   --  0.5  --  EOSABS  --   --  0.1  --   BASOSABS  --   --  0.0  --     Chemistries  Recent Labs  Lab 02/03/18 0630 02/04/18 0337 02/06/18 0301 02/07/18 0443 02/09/18 0454  NA 142 139 139  --   --   K 3.4* 3.2* 4.1  --   --   CL 114* 107 111  --   --   CO2 20* 23 20*  --   --   GLUCOSE 113* 119* 87  --   --   BUN 13 14 14   --   --   CREATININE 0.64 0.68 0.60 0.99  0.94  CALCIUM 7.9* 8.4* 8.4*  --   --   MG  --  1.7  --   --   --   AST  --  14*  --   --   --   ALT  --  9*  --   --   --   ALKPHOS  --  65  --   --   --   BILITOT  --  1.3*  --   --   --    ------------------------------------------------------------------------------------------------------------------ No results for input(s): CHOL, HDL, LDLCALC, TRIG, CHOLHDL, LDLDIRECT in the last 72 hours.  Lab Results  Component Value Date   HGBA1C 5.5 03/30/2017   ------------------------------------------------------------------------------------------------------------------ No results for input(s): TSH, T4TOTAL, T3FREE, THYROIDAB in the last 72 hours.  Invalid input(s): FREET3 ------------------------------------------------------------------------------------------------------------------ No results for input(s): VITAMINB12, FOLATE, FERRITIN, TIBC, IRON, RETICCTPCT in the last 72 hours.  Coagulation profile Recent Labs  Lab 02/02/18 2041  INR 1.28    No results for input(s): DDIMER in the last 72 hours.  Cardiac Enzymes No results for input(s): CKMB, TROPONINI, MYOGLOBIN in the last 168 hours.  Invalid input(s): CK ------------------------------------------------------------------------------------------------------------------ No results found for: BNP   Roxan Hockey M.D on 02/09/2018 at 6:24 PM  Between 7am to 7pm - Pager - [phone]  After 7pm go to [email or web address] - password [email or web address]  [facility] -  Office  [phone]   Voice Recognition Viviann Spare dictation system was used to create this note, attempts have been made to correct errors. Please contact the author with questions and/or clarifications.

## 2018-02-09 NOTE — Social Work (Addendum)
OT has evaluated patient today.  CSW then completed FL2 and sent off for SNF offers.  CSW then contacted Asst. [facility]Brooks to discuss this patient being a difficult to place due to patients substance use. He will add patient to his difficult to place list. CSW will attempt to f/u for placement as well  CSW will continue to follow up for SNF placement.  Elissa Hefty, LCSW Clinical Social Worker [phone]

## 2018-02-09 NOTE — Evaluation (Signed)
Occupational Therapy Evaluation Patient Details Name: [name] MRN: [id number] DOB: 12/18/1960 Today's Date: 02/09/2018    History of Present Illness 57 y.o. female admitted on 02/02/18 for bilateral leg pain and trouble breathing.  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  Pt with significant PMH of vasculitis, anemia, migraines, inflammatory arthritis, cocaine abuse, multiple bil amputations of LEs.     Clinical Impression   This 57 y/o female presents with the above. At baseline pt reports she is mod independent with ADLs and functional mobility using w/c. Pt demonstrating bed mobility and lateral scoot along EOB with Minguard assist this session, anticipate pt may need increased assist for functional transfers OOB; pt currently requires ModA for LB ADLs, MinA for UB ADLs due to decreased dynamic sitting balance, pain, and decreased activity tolerance. Pt will benefit from continued acute OT services and recommend additional OT services in SNF setting after discharge to maximize her safety and independence with ADLs and mobility.     Follow Up Recommendations  SNF;Supervision/Assistance - 24 hour    Equipment Recommendations  Other (comment)(TBD in next venue )           Precautions / Restrictions Precautions Precautions: Fall Precaution Comments: wound vac bil LEs Restrictions Weight Bearing Restrictions: Yes RLE Weight Bearing: Non weight bearing LLE Weight Bearing: Non weight bearing      Mobility Bed Mobility Overal bed mobility: Needs Assistance Bed Mobility: Supine to Sit;Sit to Supine     Supine to sit: Min guard Sit to supine: Min guard   General bed mobility comments: Supine to long sit in bed with slightly elevated HOB, increased time and effort with use of handrails; minguard for safety   Transfers                 General transfer  comment: pt declining OOB this session; demonstrates lateral scoot along EOB with minguard for safety; anticipate will need increased assist for functional transfer OOB     Balance Overall balance assessment: Needs assistance Sitting-balance support: Feet unsupported;Bilateral upper extremity supported Sitting balance-Leahy Scale: Fair Sitting balance - Comments: bil UE prop in static sitting close supervision, able to maintain static sitting without UE support briefly with minguard for safety                                     ADL either performed or assessed with clinical judgement   ADL Overall ADL's : Needs assistance/impaired Eating/Feeding: Modified independent;Sitting   Grooming: Min guard;Sitting   Upper Body Bathing: Min guard;Sitting   Lower Body Bathing: Minimal assistance;Sitting/lateral leans   Upper Body Dressing : Minimal assistance;Sitting Upper Body Dressing Details (indicate cue type and reason): minguard-MinA for sitting balance Lower Body Dressing: Moderate assistance;Sitting/lateral leans       Toileting- Clothing Manipulation and Hygiene: Moderate assistance;Sitting/lateral lean         General ADL Comments: pt declining OOB to chair this session, agreeable to sitting up in bed but requires encouragement; pt demonstrates lateral scoot along EOB with close minguard; discussed techniques for completing LB ADLs including lateral leans; educated on edema reduction techniques and desensitization techniques  Pertinent Vitals/Pain Pain Assessment: Faces Faces Pain Scale: Hurts even more Pain Location: bil residual limbs Pain Descriptors / Indicators: Aching;Burning;Grimacing;Guarding Pain Intervention(s): Monitored during session;Limited activity within patient's tolerance     Hand Dominance Right   Extremity/Trunk Assessment Upper Extremity Assessment Upper Extremity Assessment: Overall WFL for tasks  assessed;Generalized weakness   Lower Extremity Assessment Lower Extremity Assessment: Defer to PT evaluation   Cervical / Trunk Assessment Cervical / Trunk Assessment: Normal   Communication Communication Communication: No difficulties   Cognition Arousal/Alertness: Awake/alert Behavior During Therapy: WFL for tasks assessed/performed Overall Cognitive Status: Within Functional Limits for tasks assessed                                                      Home Living Family/patient expects to be discharged to:: Skilled nursing facility                                        Prior Functioning/Environment Level of Independence: Independent with assistive device(s)        Comments: per pt report she scooted into WC mostly, was waiting on LE wounds to heal to get bil prostheses; reports she did not need assistance for ADLs         OT Problem List: Decreased strength;Impaired balance (sitting and/or standing);Decreased range of motion;Decreased activity tolerance;Pain      OT Treatment/Interventions: Self-care/ADL training;DME and/or AE instruction;Therapeutic activities;Balance training;Therapeutic exercise;Patient/family education    OT Goals(Current goals can be found in the care plan section) Acute Rehab OT Goals Patient Stated Goal: to go to rehab and get healed up so she can get prostheses OT Goal Formulation: With patient Time For Goal Achievement: 02/23/18 Potential to Achieve Goals: Good  OT Frequency: Min 2X/week                             AM-PAC PT "6 Clicks" Daily Activity     Outcome Measure Help from another person eating meals?: None Help from another person taking care of personal grooming?: A Little Help from another person toileting, which includes using toliet, bedpan, or urinal?: A Lot Help from another person bathing (including washing, rinsing, drying)?: A Lot Help from another person to put on and  taking off regular upper body clothing?: A Little Help from another person to put on and taking off regular lower body clothing?: A Lot 6 Click Score: 16   End of Session Nurse Communication: Mobility status  Activity Tolerance: Patient tolerated treatment well;Patient limited by pain Patient left: in bed;with call bell/phone within reach  OT Visit Diagnosis: Muscle weakness (generalized) (M62.81);Other abnormalities of gait and mobility (R26.89)                Time: 9024-0973 OT Time Calculation (min): 13 min Charges:  OT General Charges $OT Visit: 1 Visit OT Evaluation $OT Eval Moderate Complexity: 1 Mod G-Codes:     Lou Cal, OT Pager [phone] 02/09/2018   Raymondo Band 02/09/2018, 1:39 PM

## 2018-02-09 NOTE — Plan of Care (Signed)
  Problem: Spiritual Needs Goal: Ability to function at adequate level Outcome: Progressing   Problem: Education: Goal: Knowledge of General Education information will improve Outcome: Progressing   Problem: Health Behavior/Discharge Planning: Goal: Ability to manage health-related needs will improve Outcome: Progressing   Problem: Clinical Measurements: Goal: Ability to maintain clinical measurements within normal limits will improve Outcome: Progressing Goal: Will remain free from infection Outcome: Progressing Goal: Diagnostic test results will improve Outcome: Progressing Goal: Respiratory complications will improve Outcome: Progressing Goal: Cardiovascular complication will be avoided Outcome: Progressing   Problem: Activity: Goal: Risk for activity intolerance will decrease Outcome: Progressing   Problem: Nutrition: Goal: Adequate nutrition will be maintained Outcome: Progressing   Problem: Coping: Goal: Level of anxiety will decrease Outcome: Progressing   Problem: Elimination: Goal: Will not experience complications related to bowel motility Outcome: Progressing Goal: Will not experience complications related to urinary retention Outcome: Progressing   Problem: Safety: Goal: Ability to remain free from injury will improve Outcome: Progressing   Problem: Skin Integrity: Goal: Risk for impaired skin integrity will decrease Outcome: Progressing   Problem: Education: Goal: Knowledge of the prescribed therapeutic regimen will improve Outcome: Progressing Goal: Ability to verbalize activity precautions or restrictions will improve Outcome: Progressing Goal: Understanding of discharge needs will improve Outcome: Progressing   Problem: Activity: Goal: Ability to perform//tolerate increased activity and mobilize with assistive devices will improve Outcome: Progressing   Problem: Clinical Measurements: Goal: Postoperative complications will be avoided or  minimized Outcome: Progressing   Problem: Self-Care: Goal: Ability to meet self-care needs will improve Outcome: Progressing   Problem: Self-Concept: Goal: Ability to maintain and perform role responsibilities to the fullest extent possible will improve Outcome: Progressing   Problem: Pain Management: Goal: Pain level will decrease with appropriate interventions Outcome: Progressing   Problem: Skin Integrity: Goal: Demonstration of wound healing without infection will improve Outcome: Progressing

## 2018-02-09 NOTE — NC FL2 (Signed)
[place] LEVEL OF CARE SCREENING TOOL     IDENTIFICATION  Patient Name: [name] Birthdate: 1961/05/03 Sex: female Admission Date (Current Location): 02/02/2018  [facility] and [place] Number:  Herbalist and Address:  [facility] [place] [place] [place] [address], [place], [place] [address]      Provider Number: [id number]  Attending Physician Name and Address:  Roxan Hockey, MD  Relative Name and Phone Number:  Ailene Ards, brother, [phone]    Current Level of Care: Hospital Recommended Level of Care: [place] Prior Approval Number:    Date Approved/Denied:   PASRR Number: [id number]  Discharge Plan: SNF    Current Diagnoses: Patient Active Problem List   Diagnosis Date Noted  . Suicidal ideation 02/02/2018  . HCAP (healthcare-associated pneumonia) 02/02/2018  . Acute on chronic respiratory failure with hypoxia ([place]) 02/02/2018  . Sepsis ([place]) 02/02/2018  . Ulcer of amputation stump of lower extremity ([place]) 10/23/2017  . Cocaine-induced mood disorder with depressive symptoms ([place]) 08/10/2017  . Hypertensive crisis   . Phantom limb pain ([place])   . S/P bilateral below knee amputation ([place]) 04/13/2017  . Tobacco abuse   . Post-operative pain   . Acute blood loss anemia   . Atherosclerosis of native arteries of extremities with gangrene, bilateral legs ([place])   . Wound infection 03/30/2017  . AKI (acute kidney injury) ([place]) 02/07/2017  . Acute kidney injury ([place]) 02/06/2017  . Wound healing, delayed   . Chest pain 04/07/2015  . Protein-calorie malnutrition, severe ([place]) 01/15/2015  . MDD (major depressive disorder), recurrent episode, severe ([place]) 10/16/2014  . Cocaine use disorder, severe, dependence ([place]) 10/10/2014  . 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 Essential hypertension 02/26/2010    Orientation RESPIRATION BLADDER Height & Weight     Self, Time, Situation, Place  O2(Nasal  Cannula 1L) Incontinent Weight: 79 lb 12.9 oz (36.2 kg) Height:  5' 1"  (154.9 cm)(Per pt)  BEHAVIORAL SYMPTOMS/MOOD NEUROLOGICAL BOWEL NUTRITION STATUS      Continent Diet(See DC Summary)  AMBULATORY STATUS COMMUNICATION OF NEEDS Skin   (wheelchair dependent, bilateral AKA) Verbally Surgical wounds, Wound Vac                       Personal Care Assistance Level of Assistance  Bathing, Feeding, Dressing Bathing Assistance: Limited assistance Feeding assistance: Limited assistance Dressing Assistance: Maximum assistance     Functional Limitations Info  Sight, Hearing, Speech Sight Info: Adequate Hearing Info: Adequate Speech Info: Adequate    SPECIAL CARE FACTORS FREQUENCY  PT (By licensed PT), OT (By licensed OT)     PT Frequency: 2x week OT Frequency: 2x week            Contractures Contractures Info: Not present    Additional Factors Info  Code Status, Allergies, Isolation Precautions, Psychotropic Code Status Info: Full Allergies Info: ACETAMINOPHEN, LISINOPRIL  Psychotropic Info: Lexapro, Buspar   Isolation Precautions Info: MRSA     Current Medications (02/09/2018):  This is the current hospital active medication list Current Facility-Administered Medications  Medication Dose Route Frequency Provider Last Rate Last Dose  . 0.9 %  sodium chloride infusion   Intravenous Continuous Newt Minion, MD 50 mL/hr at 02/04/18 1651    . 0.9 %  sodium chloride infusion   Intravenous Once Newt Minion, MD      . 0.9 %  sodium chloride infusion   Intravenous Continuous Newt Minion, MD 10  mL/hr at 02/06/18 1044    . acetaminophen (TYLENOL) tablet 325-650 mg  325-650 mg Oral Q6H PRN Newt Minion, MD      . alum & mag hydroxide-simeth (MAALOX/MYLANTA) 200-200-20 MG/5ML suspension 30 mL  30 mL Oral Q6H PRN Newt Minion, MD   30 mL at 02/02/18 1418  . amLODipine (NORVASC) tablet 10 mg  10 mg Oral Daily Newt Minion, MD   10 mg at 02/09/18 1029  . bisacodyl  (DULCOLAX) suppository 10 mg  10 mg Rectal Daily PRN Newt Minion, MD      . busPIRone (BUSPAR) tablet 15 mg  15 mg Oral TID Newt Minion, MD   15 mg at 02/09/18 1029  . docusate sodium (COLACE) capsule 100 mg  100 mg Oral BID Newt Minion, MD   100 mg at 02/09/18 1030  . enoxaparin (LOVENOX) injection 30 mg  30 mg Subcutaneous Q24H Newt Minion, MD   30 mg at 02/08/18 2155  . escitalopram (LEXAPRO) tablet 20 mg  20 mg Oral QHS Newt Minion, MD   20 mg at 02/08/18 2155  . feeding supplement (ENSURE ENLIVE) (ENSURE ENLIVE) liquid 237 mL  237 mL Oral BID BM Newt Minion, MD   237 mL at 02/07/18 1036  . HYDROmorphone (DILAUDID) injection 0.5-1 mg  0.5-1 mg Intravenous Q4H PRN Newt Minion, MD      . ibuprofen (ADVIL,MOTRIN) tablet 400 mg  400 mg Oral Q6H PRN Newt Minion, MD   400 mg at 02/08/18 2155  . ipratropium-albuterol (DUONEB) 0.5-2.5 (3) MG/3ML nebulizer solution 3 mL  3 mL Nebulization Q6H PRN Amin, Ankit Chirag, MD      . levofloxacin (LEVAQUIN) tablet 500 mg  500 mg Oral Daily Amin, Ankit Chirag, MD   500 mg at 02/09/18 1030  . magnesium citrate solution 1 Bottle  1 Bottle Oral Once PRN Newt Minion, MD      . methocarbamol (ROBAXIN) tablet 500 mg  500 mg Oral Q6H PRN Newt Minion, MD   500 mg at 02/08/18 0557   Or  . methocarbamol (ROBAXIN) 500 mg in dextrose 5 % 50 mL IVPB  500 mg Intravenous Q6H PRN Newt Minion, MD      . metoCLOPramide (REGLAN) tablet 5-10 mg  5-10 mg Oral Q8H PRN Newt Minion, MD       Or  . metoCLOPramide (REGLAN) injection 5-10 mg  5-10 mg Intravenous Q8H PRN Newt Minion, MD      . morphine 4 MG/ML injection 2-4 mg  2-4 mg Intravenous Q2H PRN Newt Minion, MD   2 mg at 02/04/18 1609  . multivitamin with minerals tablet 1 tablet  1 tablet Oral Daily Newt Minion, MD   1 tablet at 02/09/18 1029  . ondansetron (ZOFRAN) tablet 4 mg  4 mg Oral Q6H PRN Newt Minion, MD       Or  . ondansetron [facility]) injection 4 mg  4 mg Intravenous  Q6H PRN Newt Minion, MD   4 mg at 02/02/18 2052  . oxyCODONE (Oxy IR/ROXICODONE) immediate release tablet 10-15 mg  10-15 mg Oral Q4H PRN Newt Minion, MD   15 mg at 02/08/18 1705  . oxyCODONE (Oxy IR/ROXICODONE) immediate release tablet 5-10 mg  5-10 mg Oral Q4H PRN Newt Minion, MD      . polyethylene glycol (MIRALAX / GLYCOLAX) packet 17 g  17 g Oral Daily PRN  Newt Minion, MD      . zolpidem Lorrin Mais) tablet 5 mg  5 mg Oral QHS PRN Newt Minion, MD         Discharge Medications: Please see discharge summary for a list of discharge medications.  Relevant Imaging Results:  Relevant Lab Results:   Additional Information SS#: [id number]  Normajean Baxter, LCSW

## 2018-02-10 DIAGNOSIS — Z89612 Acquired absence of left leg above knee: Secondary | ICD-10-CM

## 2018-02-10 DIAGNOSIS — F14988 Cocaine use, unspecified with other cocaine-induced disorder: Secondary | ICD-10-CM

## 2018-02-10 DIAGNOSIS — I999 Unspecified disorder of circulatory system: Secondary | ICD-10-CM

## 2018-02-10 DIAGNOSIS — Z89611 Acquired absence of right leg above knee: Secondary | ICD-10-CM

## 2018-02-10 NOTE — Plan of Care (Signed)
  Problem: Spiritual Needs Goal: Ability to function at adequate level Outcome: Progressing   Problem: Education: Goal: Knowledge of General Education information will improve Outcome: Progressing   Problem: Health Behavior/Discharge Planning: Goal: Ability to manage health-related needs will improve Outcome: Progressing   Problem: Clinical Measurements: Goal: Ability to maintain clinical measurements within normal limits will improve Outcome: Progressing Goal: Will remain free from infection Outcome: Progressing Goal: Diagnostic test results will improve Outcome: Progressing Goal: Respiratory complications will improve Outcome: Progressing Goal: Cardiovascular complication will be avoided Outcome: Progressing   Problem: Activity: Goal: Risk for activity intolerance will decrease Outcome: Progressing   Problem: Nutrition: Goal: Adequate nutrition will be maintained Outcome: Progressing   Problem: Coping: Goal: Level of anxiety will decrease Outcome: Progressing   Problem: Elimination: Goal: Will not experience complications related to bowel motility Outcome: Progressing Goal: Will not experience complications related to urinary retention Outcome: Progressing   Problem: Safety: Goal: Ability to remain free from injury will improve Outcome: Progressing   Problem: Skin Integrity: Goal: Risk for impaired skin integrity will decrease Outcome: Progressing   Problem: Education: Goal: Knowledge of the prescribed therapeutic regimen will improve Outcome: Progressing Goal: Ability to verbalize activity precautions or restrictions will improve Outcome: Progressing Goal: Understanding of discharge needs will improve Outcome: Progressing   Problem: Activity: Goal: Ability to perform//tolerate increased activity and mobilize with assistive devices will improve Outcome: Progressing   Problem: Clinical Measurements: Goal: Postoperative complications will be avoided or  minimized Outcome: Progressing   Problem: Self-Care: Goal: Ability to meet self-care needs will improve Outcome: Progressing   Problem: Self-Concept: Goal: Ability to maintain and perform role responsibilities to the fullest extent possible will improve Outcome: Progressing   Problem: Pain Management: Goal: Pain level will decrease with appropriate interventions Outcome: Progressing   Problem: Skin Integrity: Goal: Demonstration of wound healing without infection will improve Outcome: Progressing

## 2018-02-10 NOTE — Social Work (Signed)
CSW spoke to admission staff at [facility] and they indicated that they indicated that the declined.  CSW then met with patient and brother [name], [phone]) at bedside and discussed barriers. Brother advised that he went to [facility] today and the SNF indicated that they had beds.  CSW then called admission staff back and they confirmed that they did in fact decline patient and will not reconsider due to substance hx.   CSw f/u for SNF placement.  Elissa Hefty, LCSW Clinical Social Worker [phone]

## 2018-02-10 NOTE — Consult Note (Signed)
Physical Medicine and Rehabilitation Consult   Reason for Consult: B-AKA Referring Physician: Dr. Denton Brick   HPI: [name] is a 57 y.o. female with history of HTN, RA, cocaine induced vascular disorder,  B-BKA with non-healing wounds,  severe ongoing cocaine dependency who was admitted on 02/02/18 with BLE pain due to gangrenous changes, trouble breathing and suicidal ideation. She was started on broad spectrum antibiotics and psychiatry consulted for input. Patient reported SI in setting of pain and denied intent for self harm therefore psychiatry signes off.  She was evaluated by Dr. Sharol Given and underwent B-AKA on 05/5 by Dr. Sharol Given.  Therapy evaluations completed yesterday and patient limited by pain and variable participation. CIR consulted due to functional deficits.    Review of Systems  Constitutional: Negative for fever.  Eyes: Negative for double vision.  Respiratory: Negative for cough.   Cardiovascular: Negative for palpitations.  Gastrointestinal: Negative for nausea.  Musculoskeletal: Positive for joint pain.  Skin: Negative for rash.  Psychiatric/Behavioral: The patient is nervous/anxious.       Past Medical History:  Diagnosis Date  . CAP (community acquired pneumonia) 03/2014 X 2  . Cocaine abuse ([place])    ongoing with resultant vaculitis.  . Depression   . Headache    "weekly" (07/29/2016)  . Hypertension   . Inflammatory arthritis   . Migraines    "probably 5-6/yr" (07/29/2016)  . Normocytic anemia    BL Hgb 9.8-12. Last anemia panel 04/2010 - showing Fe 19, ferritin 101.  Pt on monthly B12 injections  . Rheumatoid arthritis(714.0)    patient reported  . 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 Followed by Dr. Louanne Skye    Past Surgical History:  Procedure Laterality Date  . AMPUTATION Left 05/22/2016   Procedure: AMPUTATION LEFT LONG FINGER;  Surgeon: Marybelle Killings, MD;  Location: [place];  Service:  Orthopedics;  Laterality: Left;  . AMPUTATION Bilateral 04/10/2017   Procedure: AMPUTATION BELOW KNEE;  Surgeon: Newt Minion, MD;  Location: [place];  Service: Orthopedics;  Laterality: Bilateral;  . AMPUTATION Bilateral 02/06/2018   Procedure: AMPUTATION ABOVE KNEE;  Surgeon: Newt Minion, MD;  Location: [place];  Service: Orthopedics;  Laterality: Bilateral;  . HERNIA REPAIR     "stomach"  . I&D EXTREMITY Right 09/26/2015   Procedure: IRRIGATION AND DEBRIDEMENT LEG WOUND  VAC PLACEMENT.;  Surgeon: Loel Lofty Dillingham, DO;  Location: [place];  Service: Plastics;  Laterality: Right;  . INCISION AND DRAINAGE OF WOUND Bilateral 10/20/2016   Procedure: IRRIGATION AND DEBRIDEMENT WOUND BILATERAL;  Surgeon: Edrick Kins, DPM;  Location: [place];  Service: Podiatry;  Laterality: Bilateral;  . IRRIGATION AND DEBRIDEMENT ABSCESS Bilateral 09/26/2013   Procedure: DEBRIDEMENT ULCERS BILATERAL THIGHS;  Surgeon: Gwenyth Ober, MD;  Location: [place];  Service: General;  Laterality: Bilateral;  . SKIN BIOPSY Bilateral    shin nodules    Family History  Problem Relation Age of Onset  . Breast cancer Mother        Breast cancer  . Alcohol abuse Mother   . Colon cancer Maternal Aunt 52  . Alcohol abuse Father     Social History:  Lives in boarding house. Used electric WC for mobility. Per  reports that she has been smoking cigarettes.  She has a 4.56 pack-year smoking history. She has never used smokeless tobacco. She reports that she has current or past drug  history. Drugs: "Crack" cocaine and Cocaine. She reports that she does not drink alcohol.   Allergies  Allergen Reactions  . Acetaminophen Swelling and Other (See Comments)    Reaction:  Eyelid swelling  . Lisinopril     UNSPECIFIED REACTION     Medications Prior to Admission  Medication Sig Dispense Refill  . doxycycline (VIBRAMYCIN) 100 MG capsule Take 1 capsule (100 mg total) by mouth 2 (two) times daily. 20 capsule 0  . HYDROcodone-ibuprofen  (VICOPROFEN) 7.5-200 MG tablet Take 1 tablet by mouth every 6 (six) hours as needed for moderate pain. 10 tablet 0  . ibuprofen (ADVIL,MOTRIN) 200 MG tablet Take 200 mg by mouth daily as needed for moderate pain.    . naproxen sodium (ALEVE) 220 MG tablet Take 220 mg by mouth daily as needed (pain).    Marland Kitchen amLODipine (NORVASC) 10 MG tablet Take 1 tablet (10 mg total) by mouth daily. (Patient not taking: Reported on 02/02/2018) 30 tablet 0  . busPIRone (BUSPAR) 15 MG tablet Take 1 tablet (15 mg total) by mouth 3 (three) times daily. (Patient not taking: Reported on 02/02/2018) 90 tablet 0  . cyclobenzaprine (FLEXERIL) 5 MG tablet Take 1 tablet (5 mg total) by mouth 3 (three) times daily as needed (muscle soreness). (Patient not taking: Reported on 02/02/2018) 30 tablet 0  . escitalopram (LEXAPRO) 10 MG tablet Take 2 tablets (20 mg total) by mouth at bedtime. (Patient not taking: Reported on 02/02/2018) 60 tablet 4  . gabapentin (NEURONTIN) 600 MG tablet Take 1 tablet (600 mg total) by mouth 3 (three) times daily. (Patient not taking: Reported on 02/02/2018) 60 tablet 3  . hydrochlorothiazide (HYDRODIURIL) 25 MG tablet Take 1 tablet (25 mg total) daily by mouth. (Patient not taking: Reported on 02/02/2018) 30 tablet 1  . potassium chloride (K-DUR) 10 MEQ tablet Take 1 tablet (10 mEq total) daily by mouth. (Patient not taking: Reported on 02/02/2018) 30 tablet 0    Home: Home Living Family/patient expects to be discharged to:: Skilled nursing facility  Functional History: Prior Function Level of Independence: Independent with assistive device(s) Comments: per pt report she scooted into WC mostly, was waiting on LE wounds to heal to get bil prostheses; reports she did not need assistance for ADLs  Functional Status:  Mobility: Bed Mobility Overal bed mobility: Needs Assistance Bed Mobility: Supine to Sit, Sit to Supine Supine to sit: Min guard Sit to supine: Min guard General bed mobility comments: Supine to  long sit in bed with slightly elevated HOB, increased time and effort with use of handrails; minguard for safety  Transfers Overall transfer level: Needs assistance Transfers: Government social research officer transfers: +2 physical assistance, Min assist General transfer comment: pt declining OOB this session; demonstrates lateral scoot along EOB with minguard for safety; anticipate will need increased assist for functional transfer OOB       ADL: ADL Overall ADL's : Needs assistance/impaired Eating/Feeding: Modified independent, Sitting Grooming: Min guard, Sitting Upper Body Bathing: Min guard, Sitting Lower Body Bathing: Minimal assistance, Sitting/lateral leans Upper Body Dressing : Minimal assistance, Sitting Upper Body Dressing Details (indicate cue type and reason): minguard-MinA for sitting balance Lower Body Dressing: Moderate assistance, Sitting/lateral leans Toileting- Clothing Manipulation and Hygiene: Moderate assistance, Sitting/lateral lean General ADL Comments: pt declining OOB to chair this session, agreeable to sitting up in bed but requires encouragement; pt demonstrates lateral scoot along EOB with close minguard; discussed techniques for completing LB ADLs including lateral leans; educated on edema reduction techniques and  desensitization techniques   Cognition: Cognition Overall Cognitive Status: Within Functional Limits for tasks assessed Orientation Level: Oriented X4 Cognition Arousal/Alertness: Awake/alert Behavior During Therapy: WFL for tasks assessed/performed Overall Cognitive Status: Within Functional Limits for tasks assessed   Blood pressure 115/70, pulse 72, temperature 97.8 F (36.6 C), temperature source Oral, resp. rate 16, height 5' 1"  (1.549 m), weight 36.2 kg (79 lb 12.9 oz), SpO2 100 %. Physical Exam  Constitutional: She is oriented to person, place, and time. No distress.  HENT:  Head: Normocephalic.  Eyes: Pupils are  equal, round, and reactive to light.  Neck: Normal range of motion.  Cardiovascular: Normal rate.  Respiratory: Effort normal.  GI: Soft.  Musculoskeletal:  Stumps swollen, tender with AROM/PROM  Neurological: She is alert and oriented to person, place, and time.  UE 5/5. LE: can lift both legs against gravity with extra time. Legs dressed  Skin:  Incisions dressed.   Psychiatric:  anxious    No results found for this or any previous visit (from the past 24 hour(s)). No results found.   Assessment/Plan: Diagnosis: Functional deficits secondary to revisions of BKA's to AKA's 1. Does the need for close, 24 hr/day medical supervision in concert with the patient's rehab needs make it unreasonable for this patient to be served in a less intensive setting? Yes 2. Co-Morbidities requiring supervision/potential complications: HTN, depression, cocaine abuse,  Pain mgt 3. Due to bladder management, bowel management, safety, skin/wound care, disease management, medication administration, pain management and patient education, does the patient require 24 hr/day rehab nursing? Yes 4. Does the patient require coordinated care of a physician, rehab nurse, PT (1-2 hrs/day, 5 days/week) and OT (1-2 hrs/day, 5 days/week) to address physical and functional deficits in the context of the above medical diagnosis(es)? Yes Addressing deficits in the following areas: balance, endurance, locomotion, strength, transferring, bowel/bladder control, bathing, dressing, feeding, grooming, toileting and psychosocial support 5. Can the patient actively participate in an intensive therapy program of at least 3 hrs of therapy per day at least 5 days per week? Yes 6. The potential for patient to make measurable gains while on inpatient rehab is excellent 7. Anticipated functional outcomes upon discharge from inpatient rehab are modified independent  with PT, modified independent with OT, n/a with SLP. W/C level goals.   8. Estimated rehab length of stay to reach the above functional goals is: 7 days 9. Anticipated D/C setting: Home 10. Anticipated post D/C treatments: [place] therapy 11. Overall Rehab/Functional Prognosis: excellent  RECOMMENDATIONS: This patient's condition is appropriate for continued rehabilitative care in the following setting: CIR Patient has agreed to participate in recommended program. Yes Note that insurance prior authorization may be required for reimbursement for recommended care.  Comment: Rehab Admissions Coordinator to follow up.  Thanks,  Meredith Staggers, MD, Mellody Drown  I have personally performed a face to face diagnostic evaluation of this patient. Additionally, I have reviewed and concur with the physician assistant's documentation above.     Bary Leriche, PA-C 02/10/2018

## 2018-02-10 NOTE — Social Work (Signed)
CSW f/u with SNF's and no bed offers yet. [facility], [facility] denied patient.  CSW will continue to contact SNF for placement.  Elissa Hefty, LCSW Clinical Social Worker [phone]

## 2018-02-10 NOTE — Progress Notes (Signed)
Nutrition Follow Up  DOCUMENTATION CODES:   Severe malnutrition in context of social or environmental circumstances, Underweight  INTERVENTION:    Continue Ensure Enlive po BID, each supplement provides 350 kcal and 20 grams of protein  NUTRITION DIAGNOSIS:   Severe Malnutrition related to social / environmental circumstances(cocaine use) as evidenced by severe muscle depletion, severe fat depletion, ongoing  GOAL:   Patient will meet greater than or equal to 90% of their needs, progressing   MONITOR:   PO intake, Supplement acceptance, Weight trends, Labs, Skin  ASSESSMENT:   57 y.o. 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 She arrived to the ED complaining of bilateral leg pain and trouble breathing which is been going on now for several days.  Chest x-ray noted signs of right sided pneumonia as well as soft tissue swelling with new subcutaneous gas seen in her right stump on x-ray.  Patient started on broad-spectrum IV antibiotics and fluid bolus. Patient also verbalized suicidal ideation saying that she wanted to kill herself and did not want to live this way although she did not specify a plan.  Pt s/p procedure 5/5: BILATERAL ABOVE KNEE AMPUTATION  Pt continues on a Regular diet. PO intake 50-75% per flowsheet records. Receiving Ensure Enlive supplements BID. Medications include Reglan and MVI. Labs reviewed.   Diet Order:   Diet Order           Diet regular Room service appropriate? Yes; Fluid consistency: Thin  Diet effective now         EDUCATION NEEDS:   No education needs have been identified at this time  Skin:  Skin Assessment: Skin Integrity Issues: Skin Integrity Issues:: Wound VAC Wound Vac: bilateral  Last BM:  5/7  Height:   Ht Readings from Last 1 Encounters:  02/02/18 5' 1"  (1.549 m)   Weight:    Wt Readings from Last 1 Encounters:  02/02/18 79 lb 12.9 oz (36.2 kg)   Ideal Body Weight:  47.27 kg  BMI:  Body mass index is 15.08 kg/m.  Estimated Nutritional Needs:   Kcal:  1300-1500  Protein:  60-75 gm  Fluid:  >/= 1.5 L  Arthur Holms, RD, LDN Pager #: [phone] After-Hours Pager #: [phone]

## 2018-02-10 NOTE — Progress Notes (Signed)
Patient Demographics:    [name], is a 57 y.o. female, DOB - May 14, 1961, EEF:[id number]  Admit date - 02/02/2018   Admitting Physician Annita Brod, MD  Outpatient Primary MD for the patient is No primary care provider on file.  LOS - 8   Chief Complaint  Patient presents with  . Shortness of Breath        Subjective:    [name] today has no fevers, no emesis, resting comfortably, eating and drinking okay  Assessment  & Plan :    Principal Problem:   Cocaine use disorder, severe, dependence (HCC) Active Problems:   Essential hypertension   Cocaine-induced vascular disorder (HCC)   MDD (major depressive disorder), recurrent episode, severe (HCC)   Protein-calorie malnutrition, severe (HCC)   Atherosclerosis of native arteries of extremities with gangrene, bilateral legs (HCC)   Suicidal ideation   HCAP (healthcare-associated pneumonia)   Acute on chronic respiratory failure with hypoxia (HCC)   Sepsis ([place])  Brief Narrative:  57 year old female with a history of cocaine abuse, general vasculopathy, bilateral BKA and hypertension came to the hospital with complains of breathing issues.  Upon admission she was found diagnosed with concerns of pneumonia and bilateral right and left stump infection.  Dr. 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  Currently patient has wound VAC in place on both AKA stumps and awaiting SNF    Plan:- 1)Sepsis secondary to HCAP-clinically much improved, treated with Vanco and cefepime, no significant hypoxia at this time, transitioned to p.o. Levaquin on 02/09/2018 for total of 5 days (last dose 02/13/18), no significant cough or dyspnea  2)HTN-stable, continue amlodipine 10 mg daily  3)Acute on chronic anemia-patient has anemia of chronic disease, H&H has been stable since  transfusion of 2 packs of packed red blood cells on 02/03/2018  4)HFpEF-clinically stable, appears euvolemic, last known EF 55 to 60%, at baseline patient has chronic diastolic dysfunction CHF.  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  Dr. Sharol Given following  7)Disposition-patient declines inpatient rehab due to the fact that they can only take out for 1 to 2 weeks, patient is awaiting possible placement to skilled nursing facility which has proved difficult due to a history of polysubstance abuse  Code Status : Full   Disposition Plan  : SNF (difficult to place program due to history of polysubstance abuse)  Consults  :  Ortho Sharol Given)  DVT Prophylaxis  :  Lovenox   Lab Results  Component Value Date   PLT 301 02/06/2018    Inpatient Medications  Scheduled Meds: . amLODipine  10 mg Oral Daily  . busPIRone  15 mg Oral TID  . docusate sodium  100 mg Oral BID  . enoxaparin (LOVENOX) injection  30 mg Subcutaneous Q24H  . escitalopram  20 mg Oral QHS  . feeding supplement (ENSURE ENLIVE)  237 mL Oral BID BM  . levofloxacin  500 mg Oral Daily  . multivitamin with minerals  1 tablet Oral Daily   Continuous Infusions: . sodium chloride  50 mL/hr at 02/04/18 1651  . sodium chloride    . sodium chloride 10 mL/hr at 02/06/18 1044  . methocarbamol (ROBAXIN)  IV     PRN Meds:.acetaminophen, alum & mag hydroxide-simeth, bisacodyl, HYDROmorphone (DILAUDID) injection, ibuprofen, ipratropium-albuterol, magnesium citrate, methocarbamol **OR** methocarbamol (ROBAXIN)  IV, metoCLOPramide **OR** metoCLOPramide (REGLAN) injection, morphine injection, ondansetron **OR** ondansetron (ZOFRAN) IV, oxyCODONE, oxyCODONE, polyethylene glycol, zolpidem    Anti-infectives (From admission, onward)    Start     Dose/Rate Route Frequency Ordered Stop   02/09/18 1000  levofloxacin (LEVAQUIN) tablet 500 mg    Note to Pharmacy:  Dr switching pt over to Levaquin 500 mg oral per note.   500 mg Oral Daily 02/08/18 1349 02/14/18 0959   02/08/18 1145  levofloxacin (LEVAQUIN) IVPB 500 mg  Status:  Discontinued     500 mg 100 mL/hr over 60 Minutes Intravenous Every 24 hours 02/08/18 1142 02/08/18 1351   02/08/18 0900  vancomycin (VANCOCIN) IVPB 1000 mg/200 mL premix  Status:  Discontinued     1,000 mg 200 mL/hr over 60 Minutes Intravenous Every 24 hours 02/07/18 1107 02/08/18 1142   02/07/18 0600  ceFEPIme (MAXIPIME) 1 g in sodium chloride 0.9 % 100 mL IVPB  Status:  Discontinued     1 g 200 mL/hr over 30 Minutes Intravenous Every 24 hours 02/06/18 1442 02/08/18 1142   02/06/18 0630  ceFAZolin (ANCEF) IVPB 2g/100 mL premix  Status:  Discontinued     2 g 200 mL/hr over 30 Minutes Intravenous To [facility] 02/05/18 1035 02/06/18 0925   02/05/18 1015  ceFAZolin (ANCEF) IVPB 2g/100 mL premix     2 g 200 mL/hr over 30 Minutes Intravenous On call to O.R. 02/05/18 1011 02/06/18 0559   02/03/18 1800  vancomycin (VANCOCIN) 500 mg in sodium chloride 0.9 % 100 mL IVPB  Status:  Discontinued     500 mg 100 mL/hr over 60 Minutes Intravenous Every 24 hours 02/02/18 1847 02/02/18 1858   02/03/18 1000  vancomycin (VANCOCIN) IVPB 750 mg/150 ml premix  Status:  Discontinued     750 mg 150 mL/hr over 60 Minutes Intravenous Every 24 hours 02/02/18 1858 02/07/18 1107   02/02/18 2200  ceFEPIme (MAXIPIME) 1 g in sodium chloride 0.9 % 100 mL IVPB  Status:  Discontinued     1 g 200 mL/hr over 30 Minutes Intravenous Every 12 hours 02/02/18 1847 02/02/18 1858   02/02/18 2200  ceFEPIme (MAXIPIME) 1 g in sodium chloride 0.9 % 100 mL IVPB  Status:  Discontinued     1 g 200 mL/hr over 30 Minutes Intravenous Every 8 hours 02/02/18 1858 02/06/18 1442   02/02/18 1845  ceFEPIme (MAXIPIME) 2 g in sodium chloride 0.9 %  100 mL IVPB  Status:  Discontinued     2 g 200 mL/hr over 30 Minutes Intravenous  Once 02/02/18 1830 02/02/18 1847   02/02/18 1845  vancomycin (VANCOCIN) IVPB 1000 mg/200 mL premix  Status:  Discontinued     1,000 mg 200 mL/hr over 60 Minutes Intravenous  Once 02/02/18 1830 02/02/18 1847   02/02/18 1500  vancomycin (VANCOCIN) IVPB 1000 mg/200 mL premix     1,000 mg 200 mL/hr over 60 Minutes Intravenous  Once 02/02/18 1445 02/02/18 1647   02/02/18 1500  piperacillin-tazobactam (ZOSYN) IVPB 3.375 g     3.375 g 100 mL/hr over 30 Minutes Intravenous  Once 02/02/18 1445 02/02/18 1546        Objective:   Vitals:   02/09/18 1327  02/09/18 2107 02/10/18 0605 02/10/18 1345  BP: 111/68 111/64 115/70 118/67  Pulse: 75 83 72 76  Resp: 16 16 16 14   Temp: 98.2 F (36.8 C) 98.6 F (37 C) 97.8 F (36.6 C) 98.6 F (37 C)  TempSrc: Oral Oral Oral Oral  SpO2: 100% 100% 100% 100%  Weight:      Height:        Wt Readings from Last 3 Encounters:  02/02/18 36.2 kg (79 lb 12.9 oz)  01/23/18 44.5 kg (98 lb)  01/20/18 45.4 kg (100 lb)     Intake/Output Summary (Last 24 hours) at 02/10/2018 1612 Last data filed at 02/10/2018 0830 Gross per 24 hour  Intake 240 ml  Output -  Net 240 ml     Physical Exam  Gen:- Awake Alert, frail and cachectic appearing, no distress HEENT:- [place].AT, No sclera icterus Neck-Supple Neck,No JVD,.  Lungs-  CTAB , good air movement CV- S1, S2 normal, 3/6 SM Abd-  +ve B.Sounds, Abd Soft, No tenderness,    Extremity/Skin:-Bilateral AKA with wound VAC in situ Psych-affect is appropriate, oriented x3 Neuro-no new focal deficits, no tremors   Data Review:   Micro Results Recent Results (from the past 240 hour(s))  Blood culture (routine x 2)     Status: None   Collection Time: 02/02/18  2:55 PM  Result Value Ref Range Status   Specimen Description   Final    BLOOD LEFT ARM Performed at [facility] [address], [place], [place]  [address]    Special Requests   Final    BOTTLES DRAWN AEROBIC AND ANAEROBIC Blood Culture adequate volume Performed at [facility] [address], [place], [place] [address]    Culture   Final    NO GROWTH 5 DAYS Performed at [facility], [place] [address], [place], [place] [address]    Report Status 02/07/2018 FINAL  Final  Blood culture (routine x 2)     Status: None   Collection Time: 02/02/18  2:55 PM  Result Value Ref Range Status   Specimen Description   Final    BLOOD RIGHT HAND Performed at [facility] [address], [place], [place] [address]    Special Requests   Final    BOTTLES DRAWN AEROBIC AND ANAEROBIC Blood Culture adequate volume Performed at [facility] [address] [place], [place] [address]    Culture   Final    NO GROWTH 5 DAYS Performed at [facility], [place] [address] [place], [place] [address]    Report Status 02/07/2018 FINAL  Final  MRSA PCR Screening     Status: None   Collection Time: 02/02/18  4:38 PM  Result Value Ref Range Status   MRSA by PCR NEGATIVE NEGATIVE Final    Comment:        The GeneXpert MRSA Assay (FDA approved for NASAL specimens only), is one component of a comprehensive MRSA colonization surveillance program. It is not intended to diagnose MRSA infection nor to guide or monitor treatment for MRSA infections. Performed at [facility], [place] [address], [place], [place] [address]     Radiology Reports Dg Chest 2 View  Addendum Date: 02/02/2018   ADDENDUM REPORT: 02/02/2018 14:33 ADDENDUM: Acute findings discussed with and reconfirmed by Dr.KEVIN CAMPOS on 02/02/2018 at 2:32 pm. Electronically Signed   By: Elon Alas M.D.   On: 02/02/2018 14:33   Result Date: 02/02/2018  CLINICAL DATA:  Mid chest pain, cough and congestion. EXAM: CHEST - 2 VIEW COMPARISON:  Chest radiograph January 20, 2018 FINDINGS: Multifocal RIGHT lung  consolidation. Mild chronic interstitial changes without pleural effusion. Borderline cardiomegaly. Calcified aortic knob. No pneumothorax. Soft tissue planes included osseous structures are nonsuspicious. IMPRESSION: 1. Multifocal RIGHT pneumonia. 2. Borderline cardiomegaly. 3.  Aortic Atherosclerosis (ICD10-I70.0). Electronically Signed: By: Elon Alas M.D. On: 02/02/2018 14:28   Dg Chest 2 View  Result Date: 01/20/2018 CLINICAL DATA:  Shortness of breath with cough and congestion EXAM: CHEST - 2 VIEW COMPARISON:  October 23, 2017 FINDINGS: There is scarring in the right upper lobe, stable. There is no edema or consolidation. Heart size and pulmonary vascularity are normal. No adenopathy. There is aortic atherosclerosis. No evident bone lesions. IMPRESSION: Mild scarring right upper lobe, stable. No edema or consolidation. Stable cardiac silhouette. There is aortic atherosclerosis. Aortic Atherosclerosis (ICD10-I70.0). Electronically Signed   By: Lowella Grip III M.D.   On: 01/20/2018 09:51   Dg Knee 1-2 Views Left  Result Date: 02/02/2018 CLINICAL DATA:  Knee pain.  Status post BKA. EXAM: LEFT KNEE - 1-2 VIEW COMPARISON:  01/20/2018 FINDINGS: Bones are demineralized. No evidence for an acute fracture. No subluxation or dislocation. No worrisome lytic or sclerotic osseous abnormality. No gross joint effusion evident. There is some soft tissue irregularity at the stump. IMPRESSION: Osteopenia without acute bony abnormality. Soft tissue irregularity at the stump may be postoperative scarring although wound/ulcer could have this appearance. Electronically Signed   By: Misty Stanley M.D.   On: 02/02/2018 14:30   Dg Knee 1-2 Views Right  Result Date: 02/02/2018 CLINICAL DATA:  Pain and necrosis at stump, bleeding. EXAM: RIGHT KNEE - 1-2 VIEW COMPARISON:  RIGHT knee radiograph January 20, 2018 FINDINGS: Status post below-knee amputation. No acute osseous process. No rib fracture deformity or  dislocation. No advanced degenerative change for age. Increasing soft tissue swelling with superficial irregularity and subcutaneous gas about the stump. IMPRESSION: Soft tissue swelling with new subcutaneous gas seen with necrotizing fasciitis or recent instrumentation. No acute osseous process. Acute findings discussed with and reconfirmed by Dr.KEVIN CAMPOS on 02/02/2018 at 2:32 pm. Electronically Signed   By: Elon Alas M.D.   On: 02/02/2018 14:32   Dg Knee Complete 4 Views Left  Result Date: 01/20/2018 CLINICAL DATA:  Nonhealing wound at the left leg stump. EXAM: LEFT KNEE - COMPLETE 4+ VIEW COMPARISON:  12/17/2017 FINDINGS: Patient has a below the knee amputation. Stable appearance of the remaining tibia and fibula. There is no evidence for cortical irregularity or periosteal reaction at the amputation site. There is lucency and irregularity in the soft tissues at the stump. Left knee is located. IMPRESSION: No acute bone abnormality involving the amputation site. No radiographic findings for osteomyelitis. Irregularity involving the soft tissues compatible with the clinical history. Electronically Signed   By: Markus Daft M.D.   On: 01/20/2018 09:54   Dg Knee Complete 4 Views Right  Result Date: 01/20/2018 CLINICAL DATA:  Nonhealing wound at left stump EXAM: RIGHT KNEE - COMPLETE 4+ VIEW COMPARISON:  12/17/2017 FINDINGS: Prior below the knee amputation on the right. No bony changes to suggest osteomyelitis. No soft tissue gas. No acute bony abnormality. IMPRESSION: Right BKA.  No radiographic changes of osteomyelitis. Electronically Signed   By: Rolm Baptise M.D.   On: 01/20/2018 09:50     CBC Recent Labs  Lab 02/03/18 1713 02/04/18 0337 02/06/18 0301  WBC 20.4* 18.1* 8.2  HGB 11.0* 10.2* 10.2*  HCT 32.5* 30.4* 31.5*  PLT 323 280 301  MCV 87.8 87.9 89.5  MCH 29.7 29.5 29.0  MCHC 33.8 33.6 32.4  RDW 17.2* 17.7* 17.8*  LYMPHSABS  --  1.3  --   MONOABS  --  0.5  --   EOSABS  --   0.1  --   BASOSABS  --  0.0  --     Chemistries  Recent Labs  Lab 02/04/18 0337 02/06/18 0301 02/07/18 0443 02/09/18 0454  NA 139 139  --   --   K 3.2* 4.1  --   --   CL 107 111  --   --   CO2 23 20*  --   --   GLUCOSE 119* 87  --   --   BUN 14 14  --   --   CREATININE 0.68 0.60 0.99 0.94  CALCIUM 8.4* 8.4*  --   --   MG 1.7  --   --   --   AST 14*  --   --   --   ALT 9*  --   --   --   ALKPHOS 65  --   --   --   BILITOT 1.3*  --   --   --    ------------------------------------------------------------------------------------------------------------------ No results for input(s): CHOL, HDL, LDLCALC, TRIG, CHOLHDL, LDLDIRECT in the last 72 hours.  Lab Results  Component Value Date   HGBA1C 5.5 03/30/2017   ------------------------------------------------------------------------------------------------------------------ No results for input(s): TSH, T4TOTAL, T3FREE, THYROIDAB in the last 72 hours.  Invalid input(s): FREET3 ------------------------------------------------------------------------------------------------------------------ No results for input(s): VITAMINB12, FOLATE, FERRITIN, TIBC, IRON, RETICCTPCT in the last 72 hours.  Coagulation profile No results for input(s): INR, PROTIME in the last 168 hours.  No results for input(s): DDIMER in the last 72 hours.  Cardiac Enzymes No results for input(s): CKMB, TROPONINI, MYOGLOBIN in the last 168 hours.  Invalid input(s): CK ------------------------------------------------------------------------------------------------------------------ No results found for: BNP   Roxan Hockey M.D on 02/10/2018 at 4:12 PM  Between 7am to 7pm - Pager - [phone]  After 7pm go to [email or web address] - password [email or web address]  [facility] -  Office  [phone]   Voice Recognition Viviann Spare dictation system was used to create this note, attempts have been made to correct errors. Please contact the author with questions and/or  clarifications.

## 2018-02-10 NOTE — Progress Notes (Signed)
I met with patient at bedside and then contacted her brother by phone with her permission. I offered an inpatient rehab admit for 7 to 10 days and then return home to her boarding house that brother is the manager of. He states that he has spoken with a SNF near [facility] to let them know that she needs to go somewhere for 6 months to a year and can't return to the boarding house. He states he has a bed for her. He does not want her to go to CIR for only 1 to 2 weeks. Patient is in agreement to SNF after speaking with brother. I have informed SW, Mardene Celeste, that brother coming about 2 pm to assist with these arrangements. We will sign off at this time. [phone]

## 2018-02-11 ENCOUNTER — Ambulatory Visit: Payer: Self-pay | Admitting: Family Medicine

## 2018-02-11 LAB — BASIC METABOLIC PANEL
ANION GAP: 8 (ref 5–15)
BUN: 11 mg/dL (ref 6–20)
CALCIUM: 8.9 mg/dL (ref 8.9–10.3)
CO2: 25 mmol/L (ref 22–32)
Chloride: 105 mmol/L (ref 101–111)
Creatinine, Ser: 0.84 mg/dL (ref 0.44–1.00)
GFR calc non Af Amer: >60 mL/min (ref >60)
Glucose, Bld: 79 mg/dL (ref 65–99)
POTASSIUM: 4.4 mmol/L (ref 3.5–5.1)
Sodium: 138 mmol/L (ref 135–145)

## 2018-02-11 LAB — CBC
HEMATOCRIT: 22.3 % — AB (ref 36.0–46.0)
Hemoglobin: 7.2 g/dL — ABNORMAL LOW (ref 12.0–15.0)
MCH: 29.8 pg (ref 26.0–34.0)
MCHC: 32.3 g/dL (ref 30.0–36.0)
MCV: 92.1 fL (ref 78.0–100.0)
PLATELETS: 282 10*3/uL (ref 150–400)
RBC: 2.42 MIL/uL — ABNORMAL LOW (ref 3.87–5.11)
RDW: 17.9 % — AB (ref 11.5–15.5)
WBC: 5.5 10*3/uL (ref 4.0–10.5)

## 2018-02-11 LAB — PREPARE RBC (CROSSMATCH)

## 2018-02-11 MED ORDER — SODIUM CHLORIDE 0.9 % IV SOLN
Freq: Once | INTRAVENOUS | Status: AC
  Administered 2018-02-11: 18:00:00 via INTRAVENOUS

## 2018-02-11 NOTE — Progress Notes (Signed)
PROGRESS NOTE                                                                                                                                                                                                             Patient Demographics:    [name], is a 57 y.o. female, DOB - Jan 31, 1961, IZT:[id number]  Admit date - 02/02/2018   Admitting Physician Annita Brod, MD  Outpatient Primary MD for the patient is No primary care provider on file.  LOS - 9  Outpatient Specialists: None  Chief Complaint  Patient presents with  . Shortness of Breath       Brief Narrative 57 year old female with history of cocaine abuse, vasculopathy, bilateral BKA and hypertension presenting with shortness of breath with concern for pneumonia.  She was also found to have bilateral right and left stump infection. Orthopedics (Dr. Sharol Given) consulted and she underwent bilateral lower extremity above-knee amputation on 5/5 with concern for gangrene. Patient now has a wound VAC in place on both AKA stumps and awaiting SNF (difficult placement due to active substance abuse)    Subjective:   Seen and examined this morning.  Denies any pain.   Assessment  & Plan :   Principal problem Sepsis secondary to healthcare associated pneumonia and bilateral stump infection with gangrene Sepsis resolved.  Treated with empiric vancomycin and cefepime and transition to Levaquin 10  days antibiotic course will be completed on 5/12).  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  Wound VAC in place.  Pain control as needed.  Dr. Sharol Given following.  Essential hypertension Stable.  Continue amlodipine.  Acute on chronic anemia Received 2 unit PRBC 5/2.  Now worsening again (hemoglobin 7.2 today) . Will order 1 unit PRBC.   Depression Seen by psych.  Continue Lexapro and BuSpar.  Chronic diastolic  CHF Euvolemic.  Polysubstance abuse Active cocaine use.  Counseled on cessation.  Disposition: She was accepted by CIR but patient and her brother refused stating they would want to be at an SNF for longer time and not for few weeks.  Having difficulty placement due to her active polysubstance use.  Social worker following.  Severe protein calorie malnutrition Supplement added.  Code Status : Full code  Family Communication  : None at bedside  Disposition Plan  : Awaiting  SNF bed  Barriers For Discharge : Disposition  Consults  : Orthopedics (Dr. Sharol Given) CIR  Procedures  : Bilateral AKA  DVT Prophylaxis  :  Lovenox -  Lab Results  Component Value Date   PLT 282 02/11/2018    Antibiotics  :   Anti-infectives (From admission, onward)   Start     Dose/Rate Route Frequency Ordered Stop   02/09/18 1000  levofloxacin (LEVAQUIN) tablet 500 mg    Note to Pharmacy:  Dr switching pt over to Levaquin 500 mg oral per note.   500 mg Oral Daily 02/08/18 1349 02/14/18 0959   02/08/18 1145  levofloxacin (LEVAQUIN) IVPB 500 mg  Status:  Discontinued     500 mg 100 mL/hr over 60 Minutes Intravenous Every 24 hours 02/08/18 1142 02/08/18 1351   02/08/18 0900  vancomycin (VANCOCIN) IVPB 1000 mg/200 mL premix  Status:  Discontinued     1,000 mg 200 mL/hr over 60 Minutes Intravenous Every 24 hours 02/07/18 1107 02/08/18 1142   02/07/18 0600  ceFEPIme (MAXIPIME) 1 g in sodium chloride 0.9 % 100 mL IVPB  Status:  Discontinued     1 g 200 mL/hr over 30 Minutes Intravenous Every 24 hours 02/06/18 1442 02/08/18 1142   02/06/18 0630  ceFAZolin (ANCEF) IVPB 2g/100 mL premix  Status:  Discontinued     2 g 200 mL/hr over 30 Minutes Intravenous To [facility] 02/05/18 1035 02/06/18 0925   02/05/18 1015  ceFAZolin (ANCEF) IVPB 2g/100 mL premix     2 g 200 mL/hr over 30 Minutes Intravenous On call to O.R. 02/05/18 1011 02/06/18 0559   02/03/18 1800  vancomycin (VANCOCIN) 500 mg in sodium  chloride 0.9 % 100 mL IVPB  Status:  Discontinued     500 mg 100 mL/hr over 60 Minutes Intravenous Every 24 hours 02/02/18 1847 02/02/18 1858   02/03/18 1000  vancomycin (VANCOCIN) IVPB 750 mg/150 ml premix  Status:  Discontinued     750 mg 150 mL/hr over 60 Minutes Intravenous Every 24 hours 02/02/18 1858 02/07/18 1107   02/02/18 2200  ceFEPIme (MAXIPIME) 1 g in sodium chloride 0.9 % 100 mL IVPB  Status:  Discontinued     1 g 200 mL/hr over 30 Minutes Intravenous Every 12 hours 02/02/18 1847 02/02/18 1858   02/02/18 2200  ceFEPIme (MAXIPIME) 1 g in sodium chloride 0.9 % 100 mL IVPB  Status:  Discontinued     1 g 200 mL/hr over 30 Minutes Intravenous Every 8 hours 02/02/18 1858 02/06/18 1442   02/02/18 1845  ceFEPIme (MAXIPIME) 2 g in sodium chloride 0.9 % 100 mL IVPB  Status:  Discontinued     2 g 200 mL/hr over 30 Minutes Intravenous  Once 02/02/18 1830 02/02/18 1847   02/02/18 1845  vancomycin (VANCOCIN) IVPB 1000 mg/200 mL premix  Status:  Discontinued     1,000 mg 200 mL/hr over 60 Minutes Intravenous  Once 02/02/18 1830 02/02/18 1847   02/02/18 1500  vancomycin (VANCOCIN) IVPB 1000 mg/200 mL premix     1,000 mg 200 mL/hr over 60 Minutes Intravenous  Once 02/02/18 1445 02/02/18 1647   02/02/18 1500  piperacillin-tazobactam (ZOSYN) IVPB 3.375 g     3.375 g 100 mL/hr over 30 Minutes Intravenous  Once 02/02/18 1445 02/02/18 1546        Objective:   Vitals:   02/10/18 0605 02/10/18 1345 02/10/18 2032 02/11/18 0443  BP: 115/70 118/67 (!) 97/59 111/64  Pulse: 72 76 69 68  Resp:  16 14 16 12   Temp: 97.8 F (36.6 C) 98.6 F (37 C) 98.1 F (36.7 C) 98.7 F (37.1 C)  TempSrc: Oral Oral Oral Oral  SpO2: 100% 100% 99% 100%  Weight:      Height:        Wt Readings from Last 3 Encounters:  02/02/18 36.2 kg (79 lb 12.9 oz)  01/23/18 44.5 kg (98 lb)  01/20/18 45.4 kg (100 lb)     Intake/Output Summary (Last 24 hours) at 02/11/2018 1132 Last data filed at 02/11/2018 0600 Gross  per 24 hour  Intake 480 ml  Output 850 ml  Net -370 ml     Physical Exam  Gen: not in distress HEENT: Pallor present, temporal wasting, moist mucosa, supple neck Chest: clear b/l, no added sounds CVS: N S1&S2, no murmurs,  GI: soft, NT, ND,  Musculoskeletal: warm, bilateral AKA with wound VAC    Data Review:    CBC Recent Labs  Lab 02/06/18 0301 02/11/18 0449  WBC 8.2 5.5  HGB 10.2* 7.2*  HCT 31.5* 22.3*  PLT 301 282  MCV 89.5 92.1  MCH 29.0 29.8  MCHC 32.4 32.3  RDW 17.8* 17.9*    Chemistries  Recent Labs  Lab 02/06/18 0301 02/07/18 0443 02/09/18 0454 02/11/18 0449  NA 139  --   --  138  K 4.1  --   --  4.4  CL 111  --   --  105  CO2 20*  --   --  25  GLUCOSE 87  --   --  79  BUN 14  --   --  11  CREATININE 0.60 0.99 0.94 0.84  CALCIUM 8.4*  --   --  8.9   ------------------------------------------------------------------------------------------------------------------ No results for input(s): CHOL, HDL, LDLCALC, TRIG, CHOLHDL, LDLDIRECT in the last 72 hours.  Lab Results  Component Value Date   HGBA1C 5.5 03/30/2017   ------------------------------------------------------------------------------------------------------------------ No results for input(s): TSH, T4TOTAL, T3FREE, THYROIDAB in the last 72 hours.  Invalid input(s): FREET3 ------------------------------------------------------------------------------------------------------------------ No results for input(s): VITAMINB12, FOLATE, FERRITIN, TIBC, IRON, RETICCTPCT in the last 72 hours.  Coagulation profile No results for input(s): INR, PROTIME in the last 168 hours.  No results for input(s): DDIMER in the last 72 hours.  Cardiac Enzymes No results for input(s): CKMB, TROPONINI, MYOGLOBIN in the last 168 hours.  Invalid input(s): CK ------------------------------------------------------------------------------------------------------------------ No results found for:  BNP  Inpatient Medications  Scheduled Meds: . amLODipine  10 mg Oral Daily  . busPIRone  15 mg Oral TID  . docusate sodium  100 mg Oral BID  . enoxaparin (LOVENOX) injection  30 mg Subcutaneous Q24H  . escitalopram  20 mg Oral QHS  . feeding supplement (ENSURE ENLIVE)  237 mL Oral BID BM  . levofloxacin  500 mg Oral Daily  . multivitamin with minerals  1 tablet Oral Daily   Continuous Infusions: . sodium chloride    . sodium chloride 10 mL/hr at 02/06/18 1044  . methocarbamol (ROBAXIN)  IV     PRN Meds:.acetaminophen, alum & mag hydroxide-simeth, bisacodyl, HYDROmorphone (DILAUDID) injection, ibuprofen, ipratropium-albuterol, magnesium citrate, methocarbamol **OR** methocarbamol (ROBAXIN)  IV, metoCLOPramide **OR** metoCLOPramide (REGLAN) injection, morphine injection, ondansetron **OR** ondansetron (ZOFRAN) IV, oxyCODONE, oxyCODONE, polyethylene glycol, zolpidem  Micro Results Recent Results (from the past 240 hour(s))  Blood culture (routine x 2)     Status: None   Collection Time: 02/02/18  2:55 PM  Result Value Ref Range Status   Specimen Description   Final  BLOOD LEFT ARM Performed at [facility] [place] [address] [place], [place] [address]    Special Requests   Final    BOTTLES DRAWN AEROBIC AND ANAEROBIC Blood Culture adequate volume Performed at [place] [address], [place], [place] [address]    Culture   Final    NO GROWTH 5 DAYS Performed at [facility], [place] [address], [place], [place] [address]    Report Status 02/07/2018 FINAL  Final  Blood culture (routine x 2)     Status: None   Collection Time: 02/02/18  2:55 PM  Result Value Ref Range Status   Specimen Description   Final    BLOOD RIGHT HAND Performed at [facility] [address], [place], [place] [address]    Special Requests   Final    BOTTLES DRAWN AEROBIC AND ANAEROBIC Blood Culture adequate volume Performed at [facility] [address] [place], [place] [address]    Culture   Final    NO GROWTH 5 DAYS Performed at [facility], [place] [address] [place], [place] [address]    Report Status 02/07/2018 FINAL  Final  MRSA PCR Screening     Status: None   Collection Time: 02/02/18  4:38 PM  Result Value Ref Range Status   MRSA by PCR NEGATIVE NEGATIVE Final    Comment:        The GeneXpert MRSA Assay (FDA approved for NASAL specimens only), is one component of a comprehensive MRSA colonization surveillance program. It is not intended to diagnose MRSA infection nor to guide or monitor treatment for MRSA infections. Performed at [facility], [place] [address] [place], [place] [address]     Radiology Reports Dg Chest 2 View  Addendum Date: 02/02/2018   ADDENDUM REPORT: 02/02/2018 14:33 ADDENDUM: Acute findings discussed with and reconfirmed by Dr.KEVIN CAMPOS on 02/02/2018 at 2:32 pm. Electronically Signed   By: Elon Alas M.D.   On: 02/02/2018 14:33   Result Date: 02/02/2018 CLINICAL DATA:  Mid chest pain, cough and congestion. EXAM: CHEST - 2 VIEW COMPARISON:  Chest radiograph January 20, 2018 FINDINGS: Multifocal RIGHT lung consolidation. Mild chronic interstitial changes without pleural effusion. Borderline cardiomegaly. Calcified aortic knob. No pneumothorax. Soft tissue planes included osseous structures are nonsuspicious. IMPRESSION: 1. Multifocal RIGHT pneumonia. 2. Borderline cardiomegaly. 3.  Aortic Atherosclerosis (ICD10-I70.0). Electronically Signed: By: Elon Alas M.D. On: 02/02/2018 14:28   Dg Chest 2 View  Result Date: 01/20/2018 CLINICAL DATA:  Shortness of breath with cough and congestion EXAM: CHEST - 2 VIEW COMPARISON:  October 23, 2017 FINDINGS: There is scarring in the right upper lobe, stable. There is no edema or consolidation. Heart size and pulmonary vascularity are normal. No adenopathy. There is aortic atherosclerosis. No  evident bone lesions. IMPRESSION: Mild scarring right upper lobe, stable. No edema or consolidation. Stable cardiac silhouette. There is aortic atherosclerosis. Aortic Atherosclerosis (ICD10-I70.0). Electronically Signed   By: Lowella Grip III M.D.   On: 01/20/2018 09:51   Dg Knee 1-2 Views Left  Result Date: 02/02/2018 CLINICAL DATA:  Knee pain.  Status post BKA. EXAM: LEFT KNEE - 1-2 VIEW COMPARISON:  01/20/2018 FINDINGS: Bones are demineralized. No evidence for an acute fracture. No subluxation or dislocation. No worrisome lytic or sclerotic osseous abnormality. No gross joint effusion evident. There is some soft tissue irregularity at the stump. IMPRESSION: Osteopenia without acute bony abnormality. Soft tissue irregularity at the  stump may be postoperative scarring although wound/ulcer could have this appearance. Electronically Signed   By: Misty Stanley M.D.   On: 02/02/2018 14:30   Dg Knee 1-2 Views Right  Result Date: 02/02/2018 CLINICAL DATA:  Pain and necrosis at stump, bleeding. EXAM: RIGHT KNEE - 1-2 VIEW COMPARISON:  RIGHT knee radiograph January 20, 2018 FINDINGS: Status post below-knee amputation. No acute osseous process. No rib fracture deformity or dislocation. No advanced degenerative change for age. Increasing soft tissue swelling with superficial irregularity and subcutaneous gas about the stump. IMPRESSION: Soft tissue swelling with new subcutaneous gas seen with necrotizing fasciitis or recent instrumentation. No acute osseous process. Acute findings discussed with and reconfirmed by Dr.KEVIN CAMPOS on 02/02/2018 at 2:32 pm. Electronically Signed   By: Elon Alas M.D.   On: 02/02/2018 14:32   Dg Knee Complete 4 Views Left  Result Date: 01/20/2018 CLINICAL DATA:  Nonhealing wound at the left leg stump. EXAM: LEFT KNEE - COMPLETE 4+ VIEW COMPARISON:  12/17/2017 FINDINGS: Patient has a below the knee amputation. Stable appearance of the remaining tibia and fibula. There is no  evidence for cortical irregularity or periosteal reaction at the amputation site. There is lucency and irregularity in the soft tissues at the stump. Left knee is located. IMPRESSION: No acute bone abnormality involving the amputation site. No radiographic findings for osteomyelitis. Irregularity involving the soft tissues compatible with the clinical history. Electronically Signed   By: Markus Daft M.D.   On: 01/20/2018 09:54   Dg Knee Complete 4 Views Right  Result Date: 01/20/2018 CLINICAL DATA:  Nonhealing wound at left stump EXAM: RIGHT KNEE - COMPLETE 4+ VIEW COMPARISON:  12/17/2017 FINDINGS: Prior below the knee amputation on the right. No bony changes to suggest osteomyelitis. No soft tissue gas. No acute bony abnormality. IMPRESSION: Right BKA.  No radiographic changes of osteomyelitis. Electronically Signed   By: Rolm Baptise M.D.   On: 01/20/2018 09:50    Time Spent in minutes  25   Olivene Cookston M.D on 02/11/2018 at 11:32 AM  Between 7am to 7pm - Pager - [phone]  After 7pm go to [email or web address] - password [facility]  [facility] -  Office  [phone]

## 2018-02-11 NOTE — Progress Notes (Signed)
Occupational Therapy Treatment Patient Details Name: [name] MRN: [id number] DOB: May 30, 1961 Today's Date: 02/11/2018    History of present illness 57 y.o. female admitted on 02/02/18 for bilateral leg pain and trouble breathing.  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  Pt with significant PMH of vasculitis, anemia, migraines, inflammatory arthritis, cocaine abuse, multiple bil amputations of LEs.     OT comments  Limited OT session as IV team arrived to assist with new IV placement.  Assisted with bed mobility techniques and grooming task. Will continue with current OT POC with focus on increasing independence with ADLs.  Follow Up Recommendations  SNF;Supervision/Assistance - 24 hour    Equipment Recommendations       Recommendations for Other Services      Precautions / Restrictions Precautions Precautions: Fall Precaution Comments: wound vac bil LEs Restrictions RLE Weight Bearing: Non weight bearing LLE Weight Bearing: Non weight bearing       Mobility Bed Mobility Overal bed mobility: Needs Assistance Bed Mobility: (sit to long sit)     Supine to sit: Supervision;HOB elevated     General bed mobility comments:   Pt pulled on bed rails using B ues to pull up in bed (pt. had been assisted back to bed just as i was entering the room)  Transfers Overall transfer level: Needs assistance Equipment used: None Transfers: Comptroller transfers: Supervision   General transfer comment: Supervision for safety, pt took extra time due to pain, however, she was able to transfer back into recliner chair without physical assist.     Balance                                           ADL either performed or assessed with clinical judgement   ADL Overall ADL's : Needs assistance/impaired      Grooming: Wash/dry face;Set up;Bed level                                       Vision       Perception     Praxis      Cognition Arousal/Alertness: Awake/alert Behavior During Therapy: WFL for tasks assessed/performed Overall Cognitive Status: Within Functional Limits for tasks assessed                                          Exercises     Shoulder Instructions       General Comments Continued to warn against putting pillows under her residual limbs as it would encourage bil hip flexion contractures, encouraged pt to lay back at times and put bil legs down to the bed (after pillows removed both legs wer up in flexion not touching the bed (both due to pain and they had not been down recently).     Pertinent Vitals/ Pain       Pain Assessment: No/denies pain Faces Pain Scale: Hurts whole lot Pain Location: bil residual limbs Pain Descriptors / Indicators: Crying Pain Intervention(s): Limited activity within patient's tolerance;Monitored during  session;Patient requesting pain meds-RN notified;Repositioned;Other (comment)(RN reports she has had all pain meds allowed)  Home Living                                          Prior Functioning/Environment              Frequency  Min 2X/week        Progress Toward Goals  OT Goals(current goals can now be found in the care plan section)  Progress towards OT goals: Progressing toward goals  Acute Rehab OT Goals Patient Stated Goal: to go to rehab and get healed up so she can get prostheses  Plan      Co-evaluation                 AM-PAC PT "6 Clicks" Daily Activity     Outcome Measure   Help from another person eating meals?: None Help from another person taking care of personal grooming?: A Little Help from another person toileting, which includes using toliet, bedpan, or urinal?: A Lot Help from another person bathing (including washing, rinsing,  drying)?: A Lot Help from another person to put on and taking off regular upper body clothing?: A Little Help from another person to put on and taking off regular lower body clothing?: A Lot 6 Click Score: 16    End of Session    OT Visit Diagnosis: Muscle weakness (generalized) (M62.81);Other abnormalities of gait and mobility (R26.89)   Activity Tolerance Patient tolerated treatment well   Patient Left in bed;with call bell/phone within reach;with nursing/sitter in room   Nurse Communication          Time: 2111-7356 OT Time Calculation (min): 8 min  Charges: OT General Charges $OT Visit: 1 Visit OT Treatments $Self Care/Home Management : 8-22 mins   Janice Coffin, COTA/L 02/11/2018, 2:57 PM

## 2018-02-11 NOTE — Progress Notes (Signed)
Physical Therapy Treatment Patient Details Name: [name] MRN: [id number] DOB: 01-15-1961 Today's Date: 02/11/2018    History of Present Illness 57 y.o. female admitted on 02/02/18 for bilateral leg pain and trouble breathing.  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  Pt with significant PMH of vasculitis, anemia, migraines, inflammatory arthritis, cocaine abuse, multiple bil amputations of LEs.      PT Comments    Pt is progressing well with anterior posterior transfers to the recliner chair and should be ready to practice more functional lateral transfers to [facility] next session (will need to find/bring a WC-she is petite, so a small one).  PT will continue to follow acutely for progression.     Follow Up Recommendations  SNF     Equipment Recommendations  None recommended by PT    Recommendations for Other Services   NA     Precautions / Restrictions Precautions Precautions: Fall Precaution Comments: wound vac bil LEs Restrictions RLE Weight Bearing: Non weight bearing LLE Weight Bearing: Non weight bearing    Mobility  Bed Mobility Overal bed mobility: Needs Assistance Bed Mobility: (sit to long sit)     Supine to sit: Supervision;HOB elevated     General bed mobility comments: supine to long sit supervision.  Pt using bed height up and bed rails to lift trunk off of bed.    Transfers Overall transfer level: Needs assistance Equipment used: None Transfers: Comptroller transfers: Supervision   General transfer comment: Supervision for safety, pt took extra time due to pain, however, she was able to transfer back into recliner chair without physical assist.   Ambulation/Gait             General Gait Details: unable at this time due to post op bil transfemoral amputations.                  Information systems manager mobility: (to bring WC next session)  Modified Rankin (Stroke Patients Only)       Balance                                            Cognition Arousal/Alertness: Awake/alert Behavior During Therapy: WFL for tasks assessed/performed Overall Cognitive Status: Within Functional Limits for tasks assessed                                           General Comments General comments (skin integrity, edema, etc.): Continued to warn against putting pillows under her residual limbs as it would encourage bil hip flexion contractures, encouraged pt to lay back at times and put bil legs down to the bed (after pillows removed both legs wer up in flexion not touching the bed (both due to pain and they had not been down recently).       Pertinent Vitals/Pain Pain Assessment: Faces Faces Pain Scale: Hurts whole lot Pain Location: bil residual limbs Pain Descriptors / Indicators: Crying Pain Intervention(s): Limited activity within patient's tolerance;Monitored during session;Patient requesting pain meds-RN notified;Repositioned;Other (comment)(RN reports she has had all pain meds allowed)  PT Goals (current goals can now be found in the care plan section) Acute Rehab PT Goals Patient Stated Goal: to go to rehab and get healed up so she can get prostheses Progress towards PT goals: Progressing toward goals    Frequency    Min 2X/week      PT Plan Current plan remains appropriate       AM-PAC PT "6 Clicks" Daily Activity  Outcome Measure  Difficulty turning over in bed (including adjusting bedclothes, sheets and blankets)?: A Little Difficulty moving from lying on back to sitting on the side of the bed? : A Little Difficulty sitting down on and standing up from a chair with arms (e.g., wheelchair, bedside commode, etc,.)?: Unable Help needed moving to and from a bed to chair  (including a wheelchair)?: None Help needed walking in hospital room?: Total Help needed climbing 3-5 steps with a railing? : Total 6 Click Score: 13    End of Session   Activity Tolerance: Patient limited by pain Patient left: in chair;with call bell/phone within reach Nurse Communication: Mobility status;Other (comment);Patient requests pain meds(to RN tech, pain meds to RN) PT Visit Diagnosis: Muscle weakness (generalized) (M62.81);Pain Pain - Right/Left: (bil) Pain - part of body: Leg(legs)     Time: 3833-3832 PT Time Calculation (min) (ACUTE ONLY): 10 min  Charges:  $Therapeutic Activity: 8-22 mins          Berry Godsey B. Sahmya Arai, PT, DPT [phone]            02/11/2018, 11:46 AM

## 2018-02-12 LAB — BPAM RBC
Blood Product Expiration Date: 201905282359
ISSUE DATE / TIME: 201905101722
UNIT TYPE AND RH: 6200

## 2018-02-12 LAB — TYPE AND SCREEN
ABO/RH(D): A POS
ANTIBODY SCREEN: NEGATIVE
Unit division: 0

## 2018-02-12 LAB — CBC
HCT: 28.3 % — ABNORMAL LOW (ref 36.0–46.0)
HEMOGLOBIN: 9.1 g/dL — AB (ref 12.0–15.0)
MCH: 29 pg (ref 26.0–34.0)
MCHC: 32.2 g/dL (ref 30.0–36.0)
MCV: 90.1 fL (ref 78.0–100.0)
Platelets: 312 10*3/uL (ref 150–400)
RBC: 3.14 MIL/uL — ABNORMAL LOW (ref 3.87–5.11)
RDW: 17.7 % — AB (ref 11.5–15.5)
WBC: 6.6 10*3/uL (ref 4.0–10.5)

## 2018-02-12 MED ORDER — AMLODIPINE BESYLATE 5 MG PO TABS
5.0000 mg | ORAL_TABLET | Freq: Every day | ORAL | Status: DC
Start: 2018-02-13 — End: 2018-02-24
  Administered 2018-02-13 – 2018-02-21 (×7): 5 mg via ORAL
  Filled 2018-02-12 (×10): qty 1

## 2018-02-12 NOTE — Progress Notes (Addendum)
PROGRESS NOTE  [name] NWG:[id number] DOB: 03-31-1961 DOA: 02/02/2018 PCP: No primary care provider on file.   LOS: 10 days   Brief Narrative / Interim history: 57 year old female with history of cocaine abuse, vasculopathy, bilateral BKA, hypertension who presents with shortness of breath is concerning for pneumonia.  She was also found to have bilateral right and left stump infection.  Orthopedic surgery was consulted and patient underwent bilateral lower extremity AKA on 5/5.  She now has bilateral wound vacs.  Awaiting SNF placement (difficult due to active substance abuse)  Assessment & Plan: Principal Problem:   Cocaine use disorder, severe, dependence ([place]) Active Problems:   Essential hypertension   Cocaine-induced vascular disorder (HCC)   MDD (major depressive disorder), recurrent episode, severe ([place])   Protein-calorie malnutrition, severe (HCC)   Atherosclerosis of native arteries of extremities with gangrene, bilateral legs (HCC)   Suicidal ideation   HCAP (healthcare-associated pneumonia)   Acute on chronic respiratory failure with hypoxia (HCC)   Sepsis ([place])   Sepsis due to H CAP and bilateral stump infection with gangrene -Sepsis physiology resorbing, she initially got vancomycin and cefepime now transitioned to Levaquin for 10-day antibiotic course to be completed on 5/12  Bilateral lower extremity gangrene -Status post bilateral AKA on 5/5.  Continue wound VAC.  Anemia of chronic disease/acute blood loss anemia postop -Received 2 units of packed red blood cells on 5/2, and an additional unit on 5/10.  Hemoglobin stable this morning.  Hypertension -On Norvasc, continue, has had intermittent hypotension (asymptomatic), will decrease the dose of Norvasc  Polysubstance abuse -Active cocaine user  Disposition -She was accepted by CIR but patient and her brother refused wanting SNF for longer time.  Social worker is following  Severe protein calorie  malnutrition -Continue supplements   DVT prophylaxis: Lovenox Code Status: Full code Family Communication: no family at bedside Disposition Plan: SNF when bed available   Consultants:   Orthopedic surgery   Procedures:   Bilateral AKA 5/5  Antimicrobials:  Vanc / Cefepime 5/1 >>  5/8  Levaquin 5/8 >>  Subjective: - no chest pain, shortness of breath, no abdominal pain, nausea or vomiting.    Objective: Vitals:   02/11/18 2000 02/11/18 2041 02/11/18 2042 02/12/18 0441  BP: (!) 91/56 (!) 87/50 (!) 88/52 106/61  Pulse: 75 83  70  Resp: 16     Temp: 98.4 F (36.9 C) 98.3 F (36.8 C)  98.6 F (37 C)  TempSrc: Oral Oral  Oral  SpO2:  100%  100%  Weight:      Height:        Intake/Output Summary (Last 24 hours) at 02/12/2018 1221 Last data filed at 02/12/2018 0900 Gross per 24 hour  Intake 1255 ml  Output 800 ml  Net 455 ml   Filed Weights   02/02/18 1636  Weight: 36.2 kg (79 lb 12.9 oz)    Examination:  Constitutional: NAD Eyes: lids and conjunctivae norma Respiratory: clear to auscultation bilaterally, no wheezing, no crackles.  Cardiovascular: Regular rate and rhythm, no murmurs / rubs / gallops.  Abdomen: no tenderness. Bowel sounds positive.  Musculoskeletal: bilateral AKA, wound vac in place Skin: no rashes Neurologic: equal strength upper extremities   Data Reviewed: I have independently reviewed following labs and imaging studies   CBC: Recent Labs  Lab 02/06/18 0301 02/11/18 0449 02/12/18 0355  WBC 8.2 5.5 6.6  HGB 10.2* 7.2* 9.1*  HCT 31.5* 22.3* 28.3*  MCV 89.5 92.1 90.1  PLT 301  282 735   Basic Metabolic Panel: Recent Labs  Lab 02/06/18 0301 02/07/18 0443 02/09/18 0454 02/11/18 0449  NA 139  --   --  138  K 4.1  --   --  4.4  CL 111  --   --  105  CO2 20*  --   --  25  GLUCOSE 87  --   --  79  BUN 14  --   --  11  CREATININE 0.60 0.99 0.94 0.84  CALCIUM 8.4*  --   --  8.9   GFR: Estimated Creatinine Clearance: 43.8  mL/min (by C-G formula based on SCr of 0.84 mg/dL). Liver Function Tests: No results for input(s): AST, ALT, ALKPHOS, BILITOT, PROT, ALBUMIN in the last 168 hours. No results for input(s): LIPASE, AMYLASE in the last 168 hours. No results for input(s): AMMONIA in the last 168 hours. Coagulation Profile: No results for input(s): INR, PROTIME in the last 168 hours. Cardiac Enzymes: No results for input(s): CKTOTAL, CKMB, CKMBINDEX, TROPONINI in the last 168 hours. BNP (last 3 results) No results for input(s): PROBNP in the last 8760 hours. HbA1C: No results for input(s): HGBA1C in the last 72 hours. CBG: Recent Labs  Lab 02/05/18 1612 02/06/18 1219  GLUCAP 106* 211*   Lipid Profile: No results for input(s): CHOL, HDL, LDLCALC, TRIG, CHOLHDL, LDLDIRECT in the last 72 hours. Thyroid Function Tests: No results for input(s): TSH, T4TOTAL, FREET4, T3FREE, THYROIDAB in the last 72 hours. Anemia Panel: No results for input(s): VITAMINB12, FOLATE, FERRITIN, TIBC, IRON, RETICCTPCT in the last 72 hours. Urine analysis:    Component Value Date/Time   COLORURINE AMBER (A) 01/20/2018 1020   APPEARANCEUR HAZY (A) 01/20/2018 1020   LABSPEC 1.026 01/20/2018 1020   PHURINE 5.0 01/20/2018 1020   GLUCOSEU NEGATIVE 01/20/2018 1020   HGBUR LARGE (A) 01/20/2018 1020   BILIRUBINUR NEGATIVE 01/20/2018 1020   BILIRUBINUR small 04/29/2015 1533   KETONESUR NEGATIVE 01/20/2018 1020   PROTEINUR >=300 (A) 01/20/2018 1020   UROBILINOGEN 1.0 07/26/2017 1550   NITRITE NEGATIVE 01/20/2018 1020   LEUKOCYTESUR NEGATIVE 01/20/2018 1020   Sepsis Labs: Invalid input(s): PROCALCITONIN, LACTICIDVEN  Recent Results (from the past 240 hour(s))  Blood culture (routine x 2)     Status: None   Collection Time: 02/02/18  2:55 PM  Result Value Ref Range Status   Specimen Description   Final    BLOOD LEFT ARM Performed at [facility] [address] [place], [place] [address]    Special  Requests   Final    BOTTLES DRAWN AEROBIC AND ANAEROBIC Blood Culture adequate volume Performed at [facility] [address] [place], [place] [address]    Culture   Final    NO GROWTH 5 DAYS Performed at [facility], [place] [address], [place], [place] [address]    Report Status 02/07/2018 FINAL  Final  Blood culture (routine x 2)     Status: None   Collection Time: 02/02/18  2:55 PM  Result Value Ref Range Status   Specimen Description   Final    BLOOD RIGHT HAND Performed at [facility] [address] [place], [place] [address]    Special Requests   Final    BOTTLES DRAWN AEROBIC AND ANAEROBIC Blood Culture adequate volume Performed at [facility] [address], [place], [place] [address]    Culture   Final    NO GROWTH 5 DAYS Performed at  [facility], [place] [address], [place], [place] [address]    Report Status 02/07/2018 FINAL  Final  MRSA PCR Screening     Status: None   Collection Time: 02/02/18  4:38 PM  Result Value Ref Range Status   MRSA by PCR NEGATIVE NEGATIVE Final    Comment:        The GeneXpert MRSA Assay (FDA approved for NASAL specimens only), is one component of a comprehensive MRSA colonization surveillance program. It is not intended to diagnose MRSA infection nor to guide or monitor treatment for MRSA infections. Performed at [facility], [place] [address] [place], [place] [address]       Radiology Studies: No results found.   Scheduled Meds: . amLODipine  10 mg Oral Daily  . busPIRone  15 mg Oral TID  . docusate sodium  100 mg Oral BID  . enoxaparin (LOVENOX) injection  30 mg Subcutaneous Q24H  . escitalopram  20 mg Oral QHS  . feeding supplement (ENSURE ENLIVE)  237 mL Oral BID BM  . levofloxacin  500 mg Oral Daily  . multivitamin with minerals  1 tablet Oral Daily   Continuous Infusions: . sodium chloride    . sodium chloride 10 mL/hr at  02/06/18 1044  . methocarbamol (ROBAXIN)  IV       Marzetta Board, MD, PhD [facility] Pager [phone]  If 7PM-7AM, please contact night-coverage [email or web address] Password [facility] 02/12/2018, 12:21 PM

## 2018-02-13 LAB — CREATININE, SERUM
Creatinine, Ser: 0.83 mg/dL (ref 0.44–1.00)
GFR calc non Af Amer: >60 mL/min (ref >60)

## 2018-02-13 NOTE — Progress Notes (Signed)
PROGRESS NOTE        PATIENT DETAILS Name: [name] Age: 57 y.o. Sex: female Date of Birth: Jul 04, 1961 Admit Date: 02/02/2018 Admitting Physician Annita Brod, MD PCP:No primary care provider on file.  Brief Narrative: Patient is a 57 y.o. female prior history of cocaine use, hypertension, bilateral BKA-admitted with shortness of breath, found to have pneumonia, she was also found to have bilateral BKA stump infection-she was evaluated by orthopedics and underwent bilateral AKA.  Awaiting SNF placement.  Subjective: Lying comfortably in bed-denies any chest pain or shortness of breath.  Assessment/Plan: Sepsis secondary to healthcare associated pneumonia and bilateral BKA stump infection with gangrene: Sepsis pathophysiology has resolved-she underwent bilateral AKA-initially on vancomycin and cefepime-subsequently transitioned to levofloxacin-stop date of 5/12.  Blood cultures negative.  Bilateral BKA stump infection with gangrene: Underwent bilateral AKA on 5/5-Dr. Sharol Given following.  Wound VAC in place.  Anemia: Multifactorial-probably has anemia of chronic disease at baseline-hemoglobin dropped due to perioperative blood loss anemia-received 3 units of PRBC-hemoglobin currently able-continue to follow  Hypertension: Blood pressure reasonably well controlled with Norvasc-follow for now.  Polysubstance abuse/cocaine use: Counseled.  Severe protein calorie malnutrition: Continue supplements  DVT Prophylaxis: Prophylactic Lovenox   Code Status: Full code   Family Communication: None at bedside  Disposition Plan: Remain inpatient-family refused CIR-awaiting SNF placement.  Antimicrobial agents: Anti-infectives (From admission, onward)   Start     Dose/Rate Route Frequency Ordered Stop   02/09/18 1000  levofloxacin (LEVAQUIN) tablet 500 mg    Note to Pharmacy:  Dr switching pt over to Levaquin 500 mg oral per note.   500 mg Oral Daily 02/08/18  1349 02/13/18 1033   02/08/18 1145  levofloxacin (LEVAQUIN) IVPB 500 mg  Status:  Discontinued     500 mg 100 mL/hr over 60 Minutes Intravenous Every 24 hours 02/08/18 1142 02/08/18 1351   02/08/18 0900  vancomycin (VANCOCIN) IVPB 1000 mg/200 mL premix  Status:  Discontinued     1,000 mg 200 mL/hr over 60 Minutes Intravenous Every 24 hours 02/07/18 1107 02/08/18 1142   02/07/18 0600  ceFEPIme (MAXIPIME) 1 g in sodium chloride 0.9 % 100 mL IVPB  Status:  Discontinued     1 g 200 mL/hr over 30 Minutes Intravenous Every 24 hours 02/06/18 1442 02/08/18 1142   02/06/18 0630  ceFAZolin (ANCEF) IVPB 2g/100 mL premix  Status:  Discontinued     2 g 200 mL/hr over 30 Minutes Intravenous To [facility]Stay [facility] 02/05/18 1035 02/06/18 0925   02/05/18 1015  ceFAZolin (ANCEF) IVPB 2g/100 mL premix     2 g 200 mL/hr over 30 Minutes Intravenous On call to O.R. 02/05/18 1011 02/06/18 0559   02/03/18 1800  vancomycin (VANCOCIN) 500 mg in sodium chloride 0.9 % 100 mL IVPB  Status:  Discontinued     500 mg 100 mL/hr over 60 Minutes Intravenous Every 24 hours 02/02/18 1847 02/02/18 1858   02/03/18 1000  vancomycin (VANCOCIN) IVPB 750 mg/150 ml premix  Status:  Discontinued     750 mg 150 mL/hr over 60 Minutes Intravenous Every 24 hours 02/02/18 1858 02/07/18 1107   02/02/18 2200  ceFEPIme (MAXIPIME) 1 g in sodium chloride 0.9 % 100 mL IVPB  Status:  Discontinued     1 g 200 mL/hr over 30 Minutes Intravenous Every 12 hours 02/02/18 1847 02/02/18 1858  02/02/18 2200  ceFEPIme (MAXIPIME) 1 g in sodium chloride 0.9 % 100 mL IVPB  Status:  Discontinued     1 g 200 mL/hr over 30 Minutes Intravenous Every 8 hours 02/02/18 1858 02/06/18 1442   02/02/18 1845  ceFEPIme (MAXIPIME) 2 g in sodium chloride 0.9 % 100 mL IVPB  Status:  Discontinued     2 g 200 mL/hr over 30 Minutes Intravenous  Once 02/02/18 1830 02/02/18 1847   02/02/18 1845  vancomycin (VANCOCIN) IVPB 1000 mg/200 mL premix  Status:  Discontinued      1,000 mg 200 mL/hr over 60 Minutes Intravenous  Once 02/02/18 1830 02/02/18 1847   02/02/18 1500  vancomycin (VANCOCIN) IVPB 1000 mg/200 mL premix     1,000 mg 200 mL/hr over 60 Minutes Intravenous  Once 02/02/18 1445 02/02/18 1647   02/02/18 1500  piperacillin-tazobactam (ZOSYN) IVPB 3.375 g     3.375 g 100 mL/hr over 30 Minutes Intravenous  Once 02/02/18 1445 02/02/18 1546      Procedures: 5/5>> bilateral AKA  CONSULTS:  orthopedic surgery  Time spent: 25- minutes-Greater than 50% of this time was spent in counseling, explanation of diagnosis, planning of further management, and coordination of care.  MEDICATIONS: Scheduled Meds: . amLODipine  5 mg Oral Daily  . busPIRone  15 mg Oral TID  . docusate sodium  100 mg Oral BID  . enoxaparin (LOVENOX) injection  30 mg Subcutaneous Q24H  . escitalopram  20 mg Oral QHS  . feeding supplement (ENSURE ENLIVE)  237 mL Oral BID BM  . multivitamin with minerals  1 tablet Oral Daily   Continuous Infusions: . sodium chloride    . sodium chloride 10 mL/hr at 02/06/18 1044  . methocarbamol (ROBAXIN)  IV     PRN Meds:.acetaminophen, alum & mag hydroxide-simeth, bisacodyl, HYDROmorphone (DILAUDID) injection, ibuprofen, ipratropium-albuterol, magnesium citrate, methocarbamol **OR** methocarbamol (ROBAXIN)  IV, metoCLOPramide **OR** metoCLOPramide (REGLAN) injection, morphine injection, ondansetron **OR** ondansetron (ZOFRAN) IV, oxyCODONE, oxyCODONE, polyethylene glycol, zolpidem   PHYSICAL EXAM: Vital signs: Vitals:   02/12/18 1727 02/12/18 2056 02/13/18 0716 02/13/18 1034  BP: (!) 99/57 (!) 107/58 109/71 109/71  Pulse: 65 80 77   Resp: 18 16 16    Temp: 98.4 F (36.9 C) 99 F (37.2 C) 99.4 F (37.4 C)   TempSrc: Oral Oral Oral   SpO2: 100% 100% 100%   Weight:      Height:       Filed Weights   02/02/18 1636  Weight: 36.2 kg (79 lb 12.9 oz)   Body mass index is 15.08 kg/m.   General appearance :Awake, alert, not in any  distress.  Chronically sick appearing. HEENT: Atraumatic and Normocephalic Neck: supple, no JVD. No cervical lymphadenopathy. No thyromegaly Resp:Good air entry bilaterally, no added sounds  CVS: S1 S2 regular, no murmurs.  GI: Bowel sounds present, Non tender and not distended with no gaurding, rigidity or rebound.No organomegaly Extremities: Bilateral BKA-wound VAC in place Neurology: Nonfocal Musculoskeletal:No digital cyanosis Skin:No Rash, warm and dry  I have personally reviewed following labs and imaging studies  LABORATORY DATA: CBC: Recent Labs  Lab 02/11/18 0449 02/12/18 0355  WBC 5.5 6.6  HGB 7.2* 9.1*  HCT 22.3* 28.3*  MCV 92.1 90.1  PLT 282 259    Basic Metabolic Panel: Recent Labs  Lab 02/07/18 0443 02/09/18 0454 02/11/18 0449 02/13/18 0431  NA  --   --  138  --   K  --   --  4.4  --  CL  --   --  105  --   CO2  --   --  25  --   GLUCOSE  --   --  79  --   BUN  --   --  11  --   CREATININE 0.99 0.94 0.84 0.83  CALCIUM  --   --  8.9  --     GFR: Estimated Creatinine Clearance: 44.3 mL/min (by C-G formula based on SCr of 0.83 mg/dL).  Liver Function Tests: No results for input(s): AST, ALT, ALKPHOS, BILITOT, PROT, ALBUMIN in the last 168 hours. No results for input(s): LIPASE, AMYLASE in the last 168 hours. No results for input(s): AMMONIA in the last 168 hours.  Coagulation Profile: No results for input(s): INR, PROTIME in the last 168 hours.  Cardiac Enzymes: No results for input(s): CKTOTAL, CKMB, CKMBINDEX, TROPONINI in the last 168 hours.  BNP (last 3 results) No results for input(s): PROBNP in the last 8760 hours.  HbA1C: No results for input(s): HGBA1C in the last 72 hours.  CBG: Recent Labs  Lab 02/06/18 1219  GLUCAP 211*    Lipid Profile: No results for input(s): CHOL, HDL, LDLCALC, TRIG, CHOLHDL, LDLDIRECT in the last 72 hours.  Thyroid Function Tests: No results for input(s): TSH, T4TOTAL, FREET4, T3FREE, THYROIDAB in  the last 72 hours.  Anemia Panel: No results for input(s): VITAMINB12, FOLATE, FERRITIN, TIBC, IRON, RETICCTPCT in the last 72 hours.  Urine analysis:    Component Value Date/Time   COLORURINE AMBER (A) 01/20/2018 1020   APPEARANCEUR HAZY (A) 01/20/2018 1020   LABSPEC 1.026 01/20/2018 1020   PHURINE 5.0 01/20/2018 1020   GLUCOSEU NEGATIVE 01/20/2018 1020   HGBUR LARGE (A) 01/20/2018 1020   BILIRUBINUR NEGATIVE 01/20/2018 1020   BILIRUBINUR small 04/29/2015 1533   KETONESUR NEGATIVE 01/20/2018 1020   PROTEINUR >=300 (A) 01/20/2018 1020   UROBILINOGEN 1.0 07/26/2017 1550   NITRITE NEGATIVE 01/20/2018 1020   LEUKOCYTESUR NEGATIVE 01/20/2018 1020    Sepsis Labs: Lactic Acid, Venous    Component Value Date/Time   LATICACIDVEN 1.1 02/03/2018 0013    MICROBIOLOGY: No results found for this or any previous visit (from the past 240 hour(s)).  RADIOLOGY STUDIES/RESULTS: Dg Chest 2 View  Addendum Date: 02/02/2018   ADDENDUM REPORT: 02/02/2018 14:33 ADDENDUM: Acute findings discussed with and reconfirmed by Dr.KEVIN CAMPOS on 02/02/2018 at 2:32 pm. Electronically Signed   By: Elon Alas M.D.   On: 02/02/2018 14:33   Result Date: 02/02/2018 CLINICAL DATA:  Mid chest pain, cough and congestion. EXAM: CHEST - 2 VIEW COMPARISON:  Chest radiograph January 20, 2018 FINDINGS: Multifocal RIGHT lung consolidation. Mild chronic interstitial changes without pleural effusion. Borderline cardiomegaly. Calcified aortic knob. No pneumothorax. Soft tissue planes included osseous structures are nonsuspicious. IMPRESSION: 1. Multifocal RIGHT pneumonia. 2. Borderline cardiomegaly. 3.  Aortic Atherosclerosis (ICD10-I70.0). Electronically Signed: By: Elon Alas M.D. On: 02/02/2018 14:28   Dg Chest 2 View  Result Date: 01/20/2018 CLINICAL DATA:  Shortness of breath with cough and congestion EXAM: CHEST - 2 VIEW COMPARISON:  October 23, 2017 FINDINGS: There is scarring in the right upper lobe, stable.  There is no edema or consolidation. Heart size and pulmonary vascularity are normal. No adenopathy. There is aortic atherosclerosis. No evident bone lesions. IMPRESSION: Mild scarring right upper lobe, stable. No edema or consolidation. Stable cardiac silhouette. There is aortic atherosclerosis. Aortic Atherosclerosis (ICD10-I70.0). Electronically Signed   By: Lowella Grip III M.D.   On: 01/20/2018 09:51  Dg Knee 1-2 Views Left  Result Date: 02/02/2018 CLINICAL DATA:  Knee pain.  Status post BKA. EXAM: LEFT KNEE - 1-2 VIEW COMPARISON:  01/20/2018 FINDINGS: Bones are demineralized. No evidence for an acute fracture. No subluxation or dislocation. No worrisome lytic or sclerotic osseous abnormality. No gross joint effusion evident. There is some soft tissue irregularity at the stump. IMPRESSION: Osteopenia without acute bony abnormality. Soft tissue irregularity at the stump may be postoperative scarring although wound/ulcer could have this appearance. Electronically Signed   By: Misty Stanley M.D.   On: 02/02/2018 14:30   Dg Knee 1-2 Views Right  Result Date: 02/02/2018 CLINICAL DATA:  Pain and necrosis at stump, bleeding. EXAM: RIGHT KNEE - 1-2 VIEW COMPARISON:  RIGHT knee radiograph January 20, 2018 FINDINGS: Status post below-knee amputation. No acute osseous process. No rib fracture deformity or dislocation. No advanced degenerative change for age. Increasing soft tissue swelling with superficial irregularity and subcutaneous gas about the stump. IMPRESSION: Soft tissue swelling with new subcutaneous gas seen with necrotizing fasciitis or recent instrumentation. No acute osseous process. Acute findings discussed with and reconfirmed by Dr.KEVIN CAMPOS on 02/02/2018 at 2:32 pm. Electronically Signed   By: Elon Alas M.D.   On: 02/02/2018 14:32   Dg Knee Complete 4 Views Left  Result Date: 01/20/2018 CLINICAL DATA:  Nonhealing wound at the left leg stump. EXAM: LEFT KNEE - COMPLETE 4+ VIEW  COMPARISON:  12/17/2017 FINDINGS: Patient has a below the knee amputation. Stable appearance of the remaining tibia and fibula. There is no evidence for cortical irregularity or periosteal reaction at the amputation site. There is lucency and irregularity in the soft tissues at the stump. Left knee is located. IMPRESSION: No acute bone abnormality involving the amputation site. No radiographic findings for osteomyelitis. Irregularity involving the soft tissues compatible with the clinical history. Electronically Signed   By: Markus Daft M.D.   On: 01/20/2018 09:54   Dg Knee Complete 4 Views Right  Result Date: 01/20/2018 CLINICAL DATA:  Nonhealing wound at left stump EXAM: RIGHT KNEE - COMPLETE 4+ VIEW COMPARISON:  12/17/2017 FINDINGS: Prior below the knee amputation on the right. No bony changes to suggest osteomyelitis. No soft tissue gas. No acute bony abnormality. IMPRESSION: Right BKA.  No radiographic changes of osteomyelitis. Electronically Signed   By: Rolm Baptise M.D.   On: 01/20/2018 09:50     LOS: 11 days   Oren Binet, MD  [facility] Pager:[phone]  If 7PM-7AM, please contact night-coverage [email or web address] Password [email or web address] 02/13/2018, 11:11 AM

## 2018-02-14 ENCOUNTER — Encounter (HOSPITAL_COMMUNITY): Payer: Self-pay | Admitting: *Deleted

## 2018-02-14 NOTE — Social Work (Addendum)
CSW contacted admission Debra at [facility] to see if they can offer long term placement. CSW faxing clinicals for review.  CSW f/u with [facility] on bed placement.  CSW then contacted [facility] [phone]) in [place] was unable to reach staff in admissions.   CSW will continue to follow up.  2:37pm CSW able to admission staff malinda at [facility] and [facility] faxed clinicals for her review.  CSW discussed case again with [facility] and they have declined to offer.  CSW will continue to followup on placement.  4:19 pm-CSW f/u for placement at [facility]. CSW left message for Kath[facility] in admissions and faxed clinicals.  Elissa Hefty, LCSW Clinical Social Worker [phone]

## 2018-02-14 NOTE — Progress Notes (Signed)
PROGRESS NOTE        PATIENT DETAILS Name: [name] Age: 57 y.o. Sex: female Date of Birth: December 24, 1960 Admit Date: 02/02/2018 Admitting Physician Annita Brod, MD PCP:No primary care provider on file.  Brief Narrative: Patient is a 57 y.o. female prior history of cocaine use, hypertension, bilateral BKA-admitted with shortness of breath, found to have pneumonia, she was also found to have bilateral BKA stump infection-she was evaluated by orthopedics and underwent bilateral AKA.  Awaiting SNF placement.  Subjective: No major issues overnight-lying comfortably in bed, denies any chest pain or shortness of breath.  Assessment/Plan: Sepsis secondary to healthcare associated pneumonia and bilateral BKA stump infection with gangrene: Sepsis pathophysiology has resolved-underwent bilateral AKA-initially covered with broad-spectrum antimicrobial therapy-subsequently was transitioned to levofloxacin which was discontinued on 5/12.  Blood cultures remain negative.  She remained stable to be transferred to a skilled nursing facility when a bed is available.    Bilateral BKA stump infection with gangrene: Status post bilateral AKA on 5/5-Dr. Sharol Given following-wound VAC in place.    Anemia: Multifactorial-probably has anemia of chronic disease at baseline-hemoglobin dropped due to perioperative blood loss anemia-received 3 units of PRBC-hemoglobin currently able-continue to follow  Hypertension: Blood pressure controlled-continue with Norvasc.  Follow and optimize accordingly.   Polysubstance abuse/cocaine use: Counseled.  Severe protein calorie malnutrition: Continue supplements  DVT Prophylaxis: Prophylactic Lovenox   Code Status: Full code   Family Communication: None at bedside  Disposition Plan: Remain inpatient-family refused CIR-awaiting SNF placement.  Antimicrobial agents: Anti-infectives (From admission, onward)   Start     Dose/Rate Route  Frequency Ordered Stop   02/09/18 1000  levofloxacin (LEVAQUIN) tablet 500 mg    Note to Pharmacy:  Dr switching pt over to Levaquin 500 mg oral per note.   500 mg Oral Daily 02/08/18 1349 02/13/18 1033   02/08/18 1145  levofloxacin (LEVAQUIN) IVPB 500 mg  Status:  Discontinued     500 mg 100 mL/hr over 60 Minutes Intravenous Every 24 hours 02/08/18 1142 02/08/18 1351   02/08/18 0900  vancomycin (VANCOCIN) IVPB 1000 mg/200 mL premix  Status:  Discontinued     1,000 mg 200 mL/hr over 60 Minutes Intravenous Every 24 hours 02/07/18 1107 02/08/18 1142   02/07/18 0600  ceFEPIme (MAXIPIME) 1 g in sodium chloride 0.9 % 100 mL IVPB  Status:  Discontinued     1 g 200 mL/hr over 30 Minutes Intravenous Every 24 hours 02/06/18 1442 02/08/18 1142   02/06/18 0630  ceFAZolin (ANCEF) IVPB 2g/100 mL premix  Status:  Discontinued     2 g 200 mL/hr over 30 Minutes Intravenous To [facility] 02/05/18 1035 02/06/18 0925   02/05/18 1015  ceFAZolin (ANCEF) IVPB 2g/100 mL premix     2 g 200 mL/hr over 30 Minutes Intravenous On call to O.R. 02/05/18 1011 02/06/18 0559   02/03/18 1800  vancomycin (VANCOCIN) 500 mg in sodium chloride 0.9 % 100 mL IVPB  Status:  Discontinued     500 mg 100 mL/hr over 60 Minutes Intravenous Every 24 hours 02/02/18 1847 02/02/18 1858   02/03/18 1000  vancomycin (VANCOCIN) IVPB 750 mg/150 ml premix  Status:  Discontinued     750 mg 150 mL/hr over 60 Minutes Intravenous Every 24 hours 02/02/18 1858 02/07/18 1107   02/02/18 2200  ceFEPIme (MAXIPIME) 1 g in sodium chloride  0.9 % 100 mL IVPB  Status:  Discontinued     1 g 200 mL/hr over 30 Minutes Intravenous Every 12 hours 02/02/18 1847 02/02/18 1858   02/02/18 2200  ceFEPIme (MAXIPIME) 1 g in sodium chloride 0.9 % 100 mL IVPB  Status:  Discontinued     1 g 200 mL/hr over 30 Minutes Intravenous Every 8 hours 02/02/18 1858 02/06/18 1442   02/02/18 1845  ceFEPIme (MAXIPIME) 2 g in sodium chloride 0.9 % 100 mL IVPB  Status:   Discontinued     2 g 200 mL/hr over 30 Minutes Intravenous  Once 02/02/18 1830 02/02/18 1847   02/02/18 1845  vancomycin (VANCOCIN) IVPB 1000 mg/200 mL premix  Status:  Discontinued     1,000 mg 200 mL/hr over 60 Minutes Intravenous  Once 02/02/18 1830 02/02/18 1847   02/02/18 1500  vancomycin (VANCOCIN) IVPB 1000 mg/200 mL premix     1,000 mg 200 mL/hr over 60 Minutes Intravenous  Once 02/02/18 1445 02/02/18 1647   02/02/18 1500  piperacillin-tazobactam (ZOSYN) IVPB 3.375 g     3.375 g 100 mL/hr over 30 Minutes Intravenous  Once 02/02/18 1445 02/02/18 1546      Procedures: 5/5>> bilateral AKA  CONSULTS:  orthopedic surgery  Time spent: 25- minutes-Greater than 50% of this time was spent in counseling, explanation of diagnosis, planning of further management, and coordination of care.  MEDICATIONS: Scheduled Meds: . amLODipine  5 mg Oral Daily  . busPIRone  15 mg Oral TID  . docusate sodium  100 mg Oral BID  . enoxaparin (LOVENOX) injection  30 mg Subcutaneous Q24H  . escitalopram  20 mg Oral QHS  . feeding supplement (ENSURE ENLIVE)  237 mL Oral BID BM  . multivitamin with minerals  1 tablet Oral Daily   Continuous Infusions: . methocarbamol (ROBAXIN)  IV     PRN Meds:.acetaminophen, alum & mag hydroxide-simeth, bisacodyl, HYDROmorphone (DILAUDID) injection, ibuprofen, ipratropium-albuterol, magnesium citrate, methocarbamol **OR** methocarbamol (ROBAXIN)  IV, metoCLOPramide **OR** metoCLOPramide (REGLAN) injection, morphine injection, ondansetron **OR** ondansetron (ZOFRAN) IV, oxyCODONE, oxyCODONE, polyethylene glycol, zolpidem   PHYSICAL EXAM: Vital signs: Vitals:   02/13/18 1342 02/13/18 2054 02/14/18 0544 02/14/18 1059  BP: (!) 107/59 119/69 112/63 112/63  Pulse: 80 66 66   Resp: 14     Temp: 98.7 F (37.1 C) 98.4 F (36.9 C) 98.4 F (36.9 C)   TempSrc: Oral Oral Oral   SpO2: 100% 100% 100%   Weight:      Height:       Filed Weights   02/02/18 1636    Weight: 36.2 kg (79 lb 12.9 oz)   Body mass index is 15.08 kg/m.   General appearance :Awake, alert, not in any distress.  HEENT: Atraumatic and Normocephalic Neck: supple, no JVD. Resp:Good air entry bilaterally, no rales or rhonchi CVS: S1 S2 regular, no murmurs.  GI: Bowel sounds present, Non tender and not distended with no gaurding, rigidity or rebound. Extremities: B/L Lower AKA with wound VAC in place Neurology:  speech clear,Non focal, sensation is grossly intact. Psychiatric: Normal judgment and insight. Normal mood. Musculoskeletal:No digital cyanosis Skin:No Rash, warm and dry Wounds:N/A  I have personally reviewed following labs and imaging studies  LABORATORY DATA: CBC: Recent Labs  Lab 02/11/18 0449 02/12/18 0355  WBC 5.5 6.6  HGB 7.2* 9.1*  HCT 22.3* 28.3*  MCV 92.1 90.1  PLT 282 408    Basic Metabolic Panel: Recent Labs  Lab 02/09/18 0454 02/11/18 0449 02/13/18 0431  NA  --  138  --   K  --  4.4  --   CL  --  105  --   CO2  --  25  --   GLUCOSE  --  79  --   BUN  --  11  --   CREATININE 0.94 0.84 0.83  CALCIUM  --  8.9  --     GFR: Estimated Creatinine Clearance: 44.3 mL/min (by C-G formula based on SCr of 0.83 mg/dL).  Liver Function Tests: No results for input(s): AST, ALT, ALKPHOS, BILITOT, PROT, ALBUMIN in the last 168 hours. No results for input(s): LIPASE, AMYLASE in the last 168 hours. No results for input(s): AMMONIA in the last 168 hours.  Coagulation Profile: No results for input(s): INR, PROTIME in the last 168 hours.  Cardiac Enzymes: No results for input(s): CKTOTAL, CKMB, CKMBINDEX, TROPONINI in the last 168 hours.  BNP (last 3 results) No results for input(s): PROBNP in the last 8760 hours.  HbA1C: No results for input(s): HGBA1C in the last 72 hours.  CBG: No results for input(s): GLUCAP in the last 168 hours.  Lipid Profile: No results for input(s): CHOL, HDL, LDLCALC, TRIG, CHOLHDL, LDLDIRECT in the last 72  hours.  Thyroid Function Tests: No results for input(s): TSH, T4TOTAL, FREET4, T3FREE, THYROIDAB in the last 72 hours.  Anemia Panel: No results for input(s): VITAMINB12, FOLATE, FERRITIN, TIBC, IRON, RETICCTPCT in the last 72 hours.  Urine analysis:    Component Value Date/Time   COLORURINE AMBER (A) 01/20/2018 1020   APPEARANCEUR HAZY (A) 01/20/2018 1020   LABSPEC 1.026 01/20/2018 1020   PHURINE 5.0 01/20/2018 1020   GLUCOSEU NEGATIVE 01/20/2018 1020   HGBUR LARGE (A) 01/20/2018 1020   BILIRUBINUR NEGATIVE 01/20/2018 1020   BILIRUBINUR small 04/29/2015 1533   KETONESUR NEGATIVE 01/20/2018 1020   PROTEINUR >=300 (A) 01/20/2018 1020   UROBILINOGEN 1.0 07/26/2017 1550   NITRITE NEGATIVE 01/20/2018 1020   LEUKOCYTESUR NEGATIVE 01/20/2018 1020    Sepsis Labs: Lactic Acid, Venous    Component Value Date/Time   LATICACIDVEN 1.1 02/03/2018 0013    MICROBIOLOGY: No results found for this or any previous visit (from the past 240 hour(s)).  RADIOLOGY STUDIES/RESULTS: Dg Chest 2 View  Addendum Date: 02/02/2018   ADDENDUM REPORT: 02/02/2018 14:33 ADDENDUM: Acute findings discussed with and reconfirmed by Dr.KEVIN CAMPOS on 02/02/2018 at 2:32 pm. Electronically Signed   By: Elon Alas M.D.   On: 02/02/2018 14:33   Result Date: 02/02/2018 CLINICAL DATA:  Mid chest pain, cough and congestion. EXAM: CHEST - 2 VIEW COMPARISON:  Chest radiograph January 20, 2018 FINDINGS: Multifocal RIGHT lung consolidation. Mild chronic interstitial changes without pleural effusion. Borderline cardiomegaly. Calcified aortic knob. No pneumothorax. Soft tissue planes included osseous structures are nonsuspicious. IMPRESSION: 1. Multifocal RIGHT pneumonia. 2. Borderline cardiomegaly. 3.  Aortic Atherosclerosis (ICD10-I70.0). Electronically Signed: By: Elon Alas M.D. On: 02/02/2018 14:28   Dg Chest 2 View  Result Date: 01/20/2018 CLINICAL DATA:  Shortness of breath with cough and congestion EXAM:  CHEST - 2 VIEW COMPARISON:  October 23, 2017 FINDINGS: There is scarring in the right upper lobe, stable. There is no edema or consolidation. Heart size and pulmonary vascularity are normal. No adenopathy. There is aortic atherosclerosis. No evident bone lesions. IMPRESSION: Mild scarring right upper lobe, stable. No edema or consolidation. Stable cardiac silhouette. There is aortic atherosclerosis. Aortic Atherosclerosis (ICD10-I70.0). Electronically Signed   By: Lowella Grip III M.D.   On: 01/20/2018 09:51  Dg Knee 1-2 Views Left  Result Date: 02/02/2018 CLINICAL DATA:  Knee pain.  Status post BKA. EXAM: LEFT KNEE - 1-2 VIEW COMPARISON:  01/20/2018 FINDINGS: Bones are demineralized. No evidence for an acute fracture. No subluxation or dislocation. No worrisome lytic or sclerotic osseous abnormality. No gross joint effusion evident. There is some soft tissue irregularity at the stump. IMPRESSION: Osteopenia without acute bony abnormality. Soft tissue irregularity at the stump may be postoperative scarring although wound/ulcer could have this appearance. Electronically Signed   By: Misty Stanley M.D.   On: 02/02/2018 14:30   Dg Knee 1-2 Views Right  Result Date: 02/02/2018 CLINICAL DATA:  Pain and necrosis at stump, bleeding. EXAM: RIGHT KNEE - 1-2 VIEW COMPARISON:  RIGHT knee radiograph January 20, 2018 FINDINGS: Status post below-knee amputation. No acute osseous process. No rib fracture deformity or dislocation. No advanced degenerative change for age. Increasing soft tissue swelling with superficial irregularity and subcutaneous gas about the stump. IMPRESSION: Soft tissue swelling with new subcutaneous gas seen with necrotizing fasciitis or recent instrumentation. No acute osseous process. Acute findings discussed with and reconfirmed by Dr.KEVIN CAMPOS on 02/02/2018 at 2:32 pm. Electronically Signed   By: Elon Alas M.D.   On: 02/02/2018 14:32   Dg Knee Complete 4 Views Left  Result Date:  01/20/2018 CLINICAL DATA:  Nonhealing wound at the left leg stump. EXAM: LEFT KNEE - COMPLETE 4+ VIEW COMPARISON:  12/17/2017 FINDINGS: Patient has a below the knee amputation. Stable appearance of the remaining tibia and fibula. There is no evidence for cortical irregularity or periosteal reaction at the amputation site. There is lucency and irregularity in the soft tissues at the stump. Left knee is located. IMPRESSION: No acute bone abnormality involving the amputation site. No radiographic findings for osteomyelitis. Irregularity involving the soft tissues compatible with the clinical history. Electronically Signed   By: Markus Daft M.D.   On: 01/20/2018 09:54   Dg Knee Complete 4 Views Right  Result Date: 01/20/2018 CLINICAL DATA:  Nonhealing wound at left stump EXAM: RIGHT KNEE - COMPLETE 4+ VIEW COMPARISON:  12/17/2017 FINDINGS: Prior below the knee amputation on the right. No bony changes to suggest osteomyelitis. No soft tissue gas. No acute bony abnormality. IMPRESSION: Right BKA.  No radiographic changes of osteomyelitis. Electronically Signed   By: Rolm Baptise M.D.   On: 01/20/2018 09:50     LOS: 12 days   Oren Binet, MD  [facility] Pager:[phone]  If 7PM-7AM, please contact night-coverage [email or web address] Password [email or web address] 02/14/2018, 1:04 PM

## 2018-02-14 NOTE — Progress Notes (Signed)
PT Cancellation Note  Patient Details Name: [name] MRN: [id number] DOB: [birth year]   Cancelled Treatment:    Reason Eval/Treat Not Completed: Pain limiting ability to participate.  Pt reports too much pain today (10/10) to participate in therapy.  She reports she just had IV pain meds.  PT educated her on the importance of mobility OOB and that it has been 3 days since she has been up.  She did state she would call her family to try to get her [facility] here so that we can get up to her chair and go out of the room in future sessions.  PT will continue to follow acutely.   Thanks,    Barbarann Ehlers. Wateen Varon, PT, DPT [phone]   02/14/2018, 1:47 PM

## 2018-02-15 LAB — CREATININE, SERUM: CREATININE: 0.93 mg/dL (ref 0.44–1.00)

## 2018-02-15 MED ORDER — GABAPENTIN 100 MG PO CAPS
200.0000 mg | ORAL_CAPSULE | Freq: Two times a day (BID) | ORAL | Status: DC
  Administered 2018-02-15 – 2018-03-01 (×25): 200 mg via ORAL
  Filled 2018-02-15 (×30): qty 2

## 2018-02-15 MED ORDER — DIPHENHYDRAMINE HCL 50 MG/ML IJ SOLN
12.5000 mg | Freq: Once | INTRAMUSCULAR | Status: AC
  Administered 2018-02-15: 12.5 mg via INTRAVENOUS
  Filled 2018-02-15: qty 1

## 2018-02-15 MED ORDER — DIPHENHYDRAMINE HCL 25 MG PO CAPS
25.0000 mg | ORAL_CAPSULE | Freq: Four times a day (QID) | ORAL | Status: DC | PRN
  Administered 2018-02-26: 25 mg via ORAL
  Filled 2018-02-15: qty 1

## 2018-02-15 NOTE — Progress Notes (Signed)
PROGRESS NOTE        PATIENT DETAILS Name: [name] Age: 57 y.o. Sex: female Date of Birth: 1960/12/07 Admit Date: 02/02/2018 Admitting Physician Annita Brod, MD PCP:No primary care provider on file.  Brief Narrative: Patient is a 57 y.o. female prior history of cocaine use, hypertension, bilateral BKA-admitted with shortness of breath, found to have pneumonia, she was also found to have bilateral BKA stump infection-she was evaluated by orthopedics and underwent bilateral AKA.  Awaiting SNF placement.  Subjective: Complains of pain at bilateral stump site.  No chest pain or shortness of breath.  Assessment/Plan: Sepsis secondary to healthcare associated pneumonia and bilateral BKA stump infection with gangrene: Sepsis pathophysiology has resolved-underwent bilateral AKA-no longer an any antimicrobial therapy-levofloxacin was discontinued on 5/12.  Blood cultures negative.  Stable to be transferred to SNF when a bed is available.    Bilateral BKA stump infection with gangrene: Status post bilateral AKA on 5/5-spoke with Dr. Lannette Donath to remove a wound VAC and place a 4 x 4 dressing with Ace wraps.  Please of pain at the stump site-we will try a trial of Neurontin.  Continue supportive care.   Anemia: Multifactorial-probably has anemia of chronic disease at baseline-hemoglobin dropped due to perioperative blood loss anemia-received 3 units of PRBC-hemoglobin currently able-continue to follow  Hypertension: Controlled-continue with Norvasc.  Polysubstance abuse/cocaine use: Consult  Severe protein calorie malnutrition: Continue supplements.    DVT Prophylaxis: Prophylactic Lovenox   Code Status: Full code   Family Communication: None at bedside  Disposition Plan: Remain inpatient-family refused CIR-awaiting SNF placement.  Antimicrobial agents: Anti-infectives (From admission, onward)   Start     Dose/Rate Route Frequency Ordered Stop   02/09/18 1000  levofloxacin (LEVAQUIN) tablet 500 mg    Note to Pharmacy:  Dr switching pt over to Levaquin 500 mg oral per note.   500 mg Oral Daily 02/08/18 1349 02/13/18 1033   02/08/18 1145  levofloxacin (LEVAQUIN) IVPB 500 mg  Status:  Discontinued     500 mg 100 mL/hr over 60 Minutes Intravenous Every 24 hours 02/08/18 1142 02/08/18 1351   02/08/18 0900  vancomycin (VANCOCIN) IVPB 1000 mg/200 mL premix  Status:  Discontinued     1,000 mg 200 mL/hr over 60 Minutes Intravenous Every 24 hours 02/07/18 1107 02/08/18 1142   02/07/18 0600  ceFEPIme (MAXIPIME) 1 g in sodium chloride 0.9 % 100 mL IVPB  Status:  Discontinued     1 g 200 mL/hr over 30 Minutes Intravenous Every 24 hours 02/06/18 1442 02/08/18 1142   02/06/18 0630  ceFAZolin (ANCEF) IVPB 2g/100 mL premix  Status:  Discontinued     2 g 200 mL/hr over 30 Minutes Intravenous To [facility] 02/05/18 1035 02/06/18 0925   02/05/18 1015  ceFAZolin (ANCEF) IVPB 2g/100 mL premix     2 g 200 mL/hr over 30 Minutes Intravenous On call to O.R. 02/05/18 1011 02/06/18 0559   02/03/18 1800  vancomycin (VANCOCIN) 500 mg in sodium chloride 0.9 % 100 mL IVPB  Status:  Discontinued     500 mg 100 mL/hr over 60 Minutes Intravenous Every 24 hours 02/02/18 1847 02/02/18 1858   02/03/18 1000  vancomycin (VANCOCIN) IVPB 750 mg/150 ml premix  Status:  Discontinued     750 mg 150 mL/hr over 60 Minutes Intravenous Every 24 hours 02/02/18 1858 02/07/18 1107  02/02/18 2200  ceFEPIme (MAXIPIME) 1 g in sodium chloride 0.9 % 100 mL IVPB  Status:  Discontinued     1 g 200 mL/hr over 30 Minutes Intravenous Every 12 hours 02/02/18 1847 02/02/18 1858   02/02/18 2200  ceFEPIme (MAXIPIME) 1 g in sodium chloride 0.9 % 100 mL IVPB  Status:  Discontinued     1 g 200 mL/hr over 30 Minutes Intravenous Every 8 hours 02/02/18 1858 02/06/18 1442   02/02/18 1845  ceFEPIme (MAXIPIME) 2 g in sodium chloride 0.9 % 100 mL IVPB  Status:  Discontinued     2 g 200 mL/hr  over 30 Minutes Intravenous  Once 02/02/18 1830 02/02/18 1847   02/02/18 1845  vancomycin (VANCOCIN) IVPB 1000 mg/200 mL premix  Status:  Discontinued     1,000 mg 200 mL/hr over 60 Minutes Intravenous  Once 02/02/18 1830 02/02/18 1847   02/02/18 1500  vancomycin (VANCOCIN) IVPB 1000 mg/200 mL premix     1,000 mg 200 mL/hr over 60 Minutes Intravenous  Once 02/02/18 1445 02/02/18 1647   02/02/18 1500  piperacillin-tazobactam (ZOSYN) IVPB 3.375 g     3.375 g 100 mL/hr over 30 Minutes Intravenous  Once 02/02/18 1445 02/02/18 1546      Procedures: 5/5>> bilateral AKA  CONSULTS:  orthopedic surgery  Time spent: 25- minutes-Greater than 50% of this time was spent in counseling, explanation of diagnosis, planning of further management, and coordination of care.  MEDICATIONS: Scheduled Meds: . amLODipine  5 mg Oral Daily  . busPIRone  15 mg Oral TID  . docusate sodium  100 mg Oral BID  . enoxaparin (LOVENOX) injection  30 mg Subcutaneous Q24H  . escitalopram  20 mg Oral QHS  . feeding supplement (ENSURE ENLIVE)  237 mL Oral BID BM  . multivitamin with minerals  1 tablet Oral Daily   Continuous Infusions: . methocarbamol (ROBAXIN)  IV     PRN Meds:.acetaminophen, alum & mag hydroxide-simeth, bisacodyl, HYDROmorphone (DILAUDID) injection, ibuprofen, ipratropium-albuterol, magnesium citrate, methocarbamol **OR** methocarbamol (ROBAXIN)  IV, metoCLOPramide **OR** metoCLOPramide (REGLAN) injection, morphine injection, ondansetron **OR** ondansetron (ZOFRAN) IV, oxyCODONE, oxyCODONE, polyethylene glycol, zolpidem   PHYSICAL EXAM: Vital signs: Vitals:   02/14/18 1059 02/14/18 1410 02/14/18 2024 02/15/18 0502  BP: 112/63 103/65 106/63 93/62  Pulse:  75 76 68  Resp:  15 12 12   Temp:  98.1 F (36.7 C) 98.4 F (36.9 C) 97.9 F (36.6 C)  TempSrc:  Oral Oral Oral  SpO2:  100% 100% 100%  Weight:      Height:       Filed Weights   02/02/18 1636  Weight: 36.2 kg (79 lb 12.9 oz)    Body mass index is 15.08 kg/m.   General appearance :Awake, alert, not in any distress.  HEENT: Atraumatic and Normocephalic Neck: supple, no JVD. Resp:Good air entry bilaterally, no rales or rhonchi CVS: S1 S2 regular, no murmurs.  GI: Bowel sounds present, Non tender and not distended with no gaurding, rigidity or rebound. Extremities: Bilateral AKA-wound VAC in place. Neurology:  speech clear,Non focal, sensation is grossly intact. Psychiatric: Normal judgment and insight. Normal mood. Musculoskeletal:No digital cyanosis Skin:No Rash, warm and dry Wounds:N/A  I have personally reviewed following labs and imaging studies  LABORATORY DATA: CBC: Recent Labs  Lab 02/11/18 0449 02/12/18 0355  WBC 5.5 6.6  HGB 7.2* 9.1*  HCT 22.3* 28.3*  MCV 92.1 90.1  PLT 282 742    Basic Metabolic Panel: Recent Labs  Lab 02/09/18 0454  02/11/18 0449 02/13/18 0431 02/15/18 0450  NA  --  138  --   --   K  --  4.4  --   --   CL  --  105  --   --   CO2  --  25  --   --   GLUCOSE  --  79  --   --   BUN  --  11  --   --   CREATININE 0.94 0.84 0.83 0.93  CALCIUM  --  8.9  --   --     GFR: Estimated Creatinine Clearance: 39.5 mL/min (by C-G formula based on SCr of 0.93 mg/dL).  Liver Function Tests: No results for input(s): AST, ALT, ALKPHOS, BILITOT, PROT, ALBUMIN in the last 168 hours. No results for input(s): LIPASE, AMYLASE in the last 168 hours. No results for input(s): AMMONIA in the last 168 hours.  Coagulation Profile: No results for input(s): INR, PROTIME in the last 168 hours.  Cardiac Enzymes: No results for input(s): CKTOTAL, CKMB, CKMBINDEX, TROPONINI in the last 168 hours.  BNP (last 3 results) No results for input(s): PROBNP in the last 8760 hours.  HbA1C: No results for input(s): HGBA1C in the last 72 hours.  CBG: No results for input(s): GLUCAP in the last 168 hours.  Lipid Profile: No results for input(s): CHOL, HDL, LDLCALC, TRIG, CHOLHDL, LDLDIRECT  in the last 72 hours.  Thyroid Function Tests: No results for input(s): TSH, T4TOTAL, FREET4, T3FREE, THYROIDAB in the last 72 hours.  Anemia Panel: No results for input(s): VITAMINB12, FOLATE, FERRITIN, TIBC, IRON, RETICCTPCT in the last 72 hours.  Urine analysis:    Component Value Date/Time   COLORURINE AMBER (A) 01/20/2018 1020   APPEARANCEUR HAZY (A) 01/20/2018 1020   LABSPEC 1.026 01/20/2018 1020   PHURINE 5.0 01/20/2018 1020   GLUCOSEU NEGATIVE 01/20/2018 1020   HGBUR LARGE (A) 01/20/2018 1020   BILIRUBINUR NEGATIVE 01/20/2018 1020   BILIRUBINUR small 04/29/2015 1533   KETONESUR NEGATIVE 01/20/2018 1020   PROTEINUR >=300 (A) 01/20/2018 1020   UROBILINOGEN 1.0 07/26/2017 1550   NITRITE NEGATIVE 01/20/2018 1020   LEUKOCYTESUR NEGATIVE 01/20/2018 1020    Sepsis Labs: Lactic Acid, Venous    Component Value Date/Time   LATICACIDVEN 1.1 02/03/2018 0013    MICROBIOLOGY: No results found for this or any previous visit (from the past 240 hour(s)).  RADIOLOGY STUDIES/RESULTS: Dg Chest 2 View  Addendum Date: 02/02/2018   ADDENDUM REPORT: 02/02/2018 14:33 ADDENDUM: Acute findings discussed with and reconfirmed by Dr.KEVIN CAMPOS on 02/02/2018 at 2:32 pm. Electronically Signed   By: Elon Alas M.D.   On: 02/02/2018 14:33   Result Date: 02/02/2018 CLINICAL DATA:  Mid chest pain, cough and congestion. EXAM: CHEST - 2 VIEW COMPARISON:  Chest radiograph January 20, 2018 FINDINGS: Multifocal RIGHT lung consolidation. Mild chronic interstitial changes without pleural effusion. Borderline cardiomegaly. Calcified aortic knob. No pneumothorax. Soft tissue planes included osseous structures are nonsuspicious. IMPRESSION: 1. Multifocal RIGHT pneumonia. 2. Borderline cardiomegaly. 3.  Aortic Atherosclerosis (ICD10-I70.0). Electronically Signed: By: Elon Alas M.D. On: 02/02/2018 14:28   Dg Chest 2 View  Result Date: 01/20/2018 CLINICAL DATA:  Shortness of breath with cough and  congestion EXAM: CHEST - 2 VIEW COMPARISON:  October 23, 2017 FINDINGS: There is scarring in the right upper lobe, stable. There is no edema or consolidation. Heart size and pulmonary vascularity are normal. No adenopathy. There is aortic atherosclerosis. No evident bone lesions. IMPRESSION: Mild scarring right upper lobe, stable.  No edema or consolidation. Stable cardiac silhouette. There is aortic atherosclerosis. Aortic Atherosclerosis (ICD10-I70.0). Electronically Signed   By: Lowella Grip III M.D.   On: 01/20/2018 09:51   Dg Knee 1-2 Views Left  Result Date: 02/02/2018 CLINICAL DATA:  Knee pain.  Status post BKA. EXAM: LEFT KNEE - 1-2 VIEW COMPARISON:  01/20/2018 FINDINGS: Bones are demineralized. No evidence for an acute fracture. No subluxation or dislocation. No worrisome lytic or sclerotic osseous abnormality. No gross joint effusion evident. There is some soft tissue irregularity at the stump. IMPRESSION: Osteopenia without acute bony abnormality. Soft tissue irregularity at the stump may be postoperative scarring although wound/ulcer could have this appearance. Electronically Signed   By: Misty Stanley M.D.   On: 02/02/2018 14:30   Dg Knee 1-2 Views Right  Result Date: 02/02/2018 CLINICAL DATA:  Pain and necrosis at stump, bleeding. EXAM: RIGHT KNEE - 1-2 VIEW COMPARISON:  RIGHT knee radiograph January 20, 2018 FINDINGS: Status post below-knee amputation. No acute osseous process. No rib fracture deformity or dislocation. No advanced degenerative change for age. Increasing soft tissue swelling with superficial irregularity and subcutaneous gas about the stump. IMPRESSION: Soft tissue swelling with new subcutaneous gas seen with necrotizing fasciitis or recent instrumentation. No acute osseous process. Acute findings discussed with and reconfirmed by Dr.KEVIN CAMPOS on 02/02/2018 at 2:32 pm. Electronically Signed   By: Elon Alas M.D.   On: 02/02/2018 14:32   Dg Knee Complete 4 Views  Left  Result Date: 01/20/2018 CLINICAL DATA:  Nonhealing wound at the left leg stump. EXAM: LEFT KNEE - COMPLETE 4+ VIEW COMPARISON:  12/17/2017 FINDINGS: Patient has a below the knee amputation. Stable appearance of the remaining tibia and fibula. There is no evidence for cortical irregularity or periosteal reaction at the amputation site. There is lucency and irregularity in the soft tissues at the stump. Left knee is located. IMPRESSION: No acute bone abnormality involving the amputation site. No radiographic findings for osteomyelitis. Irregularity involving the soft tissues compatible with the clinical history. Electronically Signed   By: Markus Daft M.D.   On: 01/20/2018 09:54   Dg Knee Complete 4 Views Right  Result Date: 01/20/2018 CLINICAL DATA:  Nonhealing wound at left stump EXAM: RIGHT KNEE - COMPLETE 4+ VIEW COMPARISON:  12/17/2017 FINDINGS: Prior below the knee amputation on the right. No bony changes to suggest osteomyelitis. No soft tissue gas. No acute bony abnormality. IMPRESSION: Right BKA.  No radiographic changes of osteomyelitis. Electronically Signed   By: Rolm Baptise M.D.   On: 01/20/2018 09:50     LOS: 13 days   Oren Binet, MD  [facility] Pager:[phone]  If 7PM-7AM, please contact night-coverage [email or web address] Password [email or web address] 02/15/2018, 1:20 PM

## 2018-02-15 NOTE — Progress Notes (Signed)
Occupational Therapy Treatment Patient Details Name: [name] MRN: [id number] DOB: 24-Jun-1961 Today's Date: 02/15/2018    History of present illness 57 y.o. female admitted on 02/02/18 for bilateral leg pain and trouble breathing.  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  Pt with significant PMH of vasculitis, anemia, migraines, inflammatory arthritis, cocaine abuse, multiple bil amputations of LEs.     OT comments  Pt demonstrating improvement toward OT goals this session. She did decline OOB mobility to day stating that she had been sitting in chair earlier today. OT educated pt concerning importance of increasing activity and benefits of getting to chair multiple times during the day but she continued to decline. Pt was agreeable to B UE strengthening exercises to improve strength and activity tolerance for ADL and wheelchair mobility. Provided HEP with handout and link for online videos through [facility] with level 2 resistance bands. Placed HEP copy in shadow chart.    Follow Up Recommendations  SNF;Supervision/Assistance - 24 hour    Equipment Recommendations  Other (comment)(TBD at next venue of care. )    Recommendations for Other Services      Precautions / Restrictions Precautions Precautions: Fall Precaution Comments: wound vac bil LEs Restrictions Weight Bearing Restrictions: Yes RLE Weight Bearing: Non weight bearing LLE Weight Bearing: Non weight bearing       Mobility Bed Mobility Overal bed mobility: Modified Independent             General bed mobility comments: Able to pull self up to reposition in bed.   Transfers                 General transfer comment: Pt declined OOB mobility.     Balance                                           ADL either performed or assessed with clinical judgement   ADL Overall  ADL's : Needs assistance/impaired                                       General ADL Comments: Pt declining OOB to chair this session. She was agreeable to B UE strengthening exercises as detailed in exercise section below. Provided [employer] handout with images and link for video as well as orange resistance bands.      Vision       Perception     Praxis      Cognition Arousal/Alertness: Awake/alert Behavior During Therapy: WFL for tasks assessed/performed Overall Cognitive Status: Within Functional Limits for tasks assessed                                          Exercises Exercises: General Upper Extremity;Other exercises General Exercises - Upper Extremity Shoulder Flexion: Strengthening;Both;Supine;Theraband;10 reps Theraband Level (Shoulder Flexion): Level 2 (Red) Shoulder ABduction: Strengthening;Both;10 reps;Supine;Theraband Theraband Level (Shoulder Abduction): Level 2 (Red) Elbow Flexion: Strengthening;Both;20 reps;Supine;Theraband Theraband Level (Elbow Flexion): Level 2 (Red) Other Exercises Other Exercises: Facilitated 10 repetitions of shoulder press with level 2 (red) theraband.  Shoulder Instructions       General Comments Educated concerning importance of OOB mobility as well as participation with therapies to maximize progress. She verbalized understanding.     Pertinent Vitals/ Pain       Pain Assessment: Faces Faces Pain Scale: Hurts a little bit Pain Location: bil residual limbs Pain Descriptors / Indicators: Discomfort Pain Intervention(s): Monitored during session  Home Living                                          Prior Functioning/Environment              Frequency  Min 2X/week        Progress Toward Goals  OT Goals(current goals can now be found in the care plan section)  Progress towards OT goals: Progressing toward goals  Acute Rehab OT Goals Patient Stated Goal: pt  awaiting her personal w/c so she can be more mobile OT Goal Formulation: With patient Time For Goal Achievement: 02/23/18 Potential to Achieve Goals: Good  Plan Discharge plan remains appropriate    Co-evaluation                 AM-PAC PT "6 Clicks" Daily Activity     Outcome Measure   Help from another person eating meals?: None Help from another person taking care of personal grooming?: A Little Help from another person toileting, which includes using toliet, bedpan, or urinal?: A Lot Help from another person bathing (including washing, rinsing, drying)?: A Lot Help from another person to put on and taking off regular upper body clothing?: A Little Help from another person to put on and taking off regular lower body clothing?: A Lot 6 Click Score: 16    End of Session    OT Visit Diagnosis: Muscle weakness (generalized) (M62.81);Other abnormalities of gait and mobility (R26.89)   Activity Tolerance Patient tolerated treatment well   Patient Left in bed;with call bell/phone within reach;with nursing/sitter in room   Nurse Communication Mobility status        Time: 1027-2536 OT Time Calculation (min): 19 min  Charges: OT General Charges $OT Visit: 1 Visit OT Treatments $Therapeutic Exercise: 8-22 mins   Norman Herrlich, MS OTR/L  Pager: [place] 02/15/2018, 3:29 PM

## 2018-02-15 NOTE — Progress Notes (Signed)
Physical Therapy Treatment Patient Details Name: [name] MRN: [id number] DOB: 02-13-1961 Today's Date: 02/15/2018    History of Present Illness 57 y.o. female admitted on 02/02/18 for bilateral leg pain and trouble breathing.  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  Pt with significant PMH of vasculitis, anemia, migraines, inflammatory arthritis, cocaine abuse, multiple bil amputations of LEs.      PT Comments    Pt reporting just having medications for pain and agreeable to limited treatment.  Completes AP transfer to recliner with supervision.  PT and pt discussed possibility of lying prone for extended hip flexor stretch once wound vacs removed.  Also discussed side lying and hip extension for strengthening and stretching.  Pt also called brother regarding her personal w/c and he states he should be able to bring it this afternoon.  Pt looking forward to being able to transfer to her w/c and have more mobility.      Follow Up Recommendations  SNF     Equipment Recommendations  None recommended by PT    Recommendations for Other Services       Precautions / Restrictions Precautions Precautions: Fall Precaution Comments: wound vac bil LEs Restrictions Weight Bearing Restrictions: Yes RLE Weight Bearing: Non weight bearing LLE Weight Bearing: Non weight bearing    Mobility  Bed Mobility Overal bed mobility: Modified Independent Bed Mobility: Supine to Sit;Sit to Supine     Supine to sit: HOB elevated;Modified independent (Device/Increase time)     General bed mobility comments: able to utilize BUEs to come to sitting and pivot in bed to line up for AP transfer  Transfers Overall transfer level: Needs assistance Equipment used: None Transfers: Comptroller transfers: Supervision   General transfer comment:  supervision to stedy recliner and keep pillow in position  Ambulation/Gait                 Stairs             Wheelchair Mobility    Modified Rankin (Stroke Patients Only)       Balance   Sitting-balance support: Feet unsupported;Bilateral upper extremity supported Sitting balance-Leahy Scale: Fair Sitting balance - Comments: bil UE prop in static sitting close supervision, able to maintain static sitting without UE support briefly with minguard for safety                                      Cognition Arousal/Alertness: Awake/alert Behavior During Therapy: WFL for tasks assessed/performed Overall Cognitive Status: Within Functional Limits for tasks assessed                                        Exercises      General Comments        Pertinent Vitals/Pain Pain Assessment: 0-10 Pain Score: 6  Pain Location: bil residual limbs Pain Intervention(s): Repositioned    Home Living                      Prior Function            PT Goals (current goals can now be found  in the care plan section) Acute Rehab PT Goals Patient Stated Goal: pt awaiting her personal w/c so she can be more mobile PT Goal Formulation: With patient Time For Goal Achievement: 02/22/18 Potential to Achieve Goals: Good Progress towards PT goals: Progressing toward goals    Frequency    Min 2X/week      PT Plan Current plan remains appropriate    Co-evaluation              AM-PAC PT "6 Clicks" Daily Activity  Outcome Measure  Difficulty turning over in bed (including adjusting bedclothes, sheets and blankets)?: A Little Difficulty moving from lying on back to sitting on the side of the bed? : A Little Difficulty sitting down on and standing up from a chair with arms (e.g., wheelchair, bedside commode, etc,.)?: Unable Help needed moving to and from a bed to chair (including a wheelchair)?: A Little Help needed walking in  hospital room?: Total Help needed climbing 3-5 steps with a railing? : Total 6 Click Score: 12    End of Session   Activity Tolerance: Patient limited by pain Patient left: in chair;with call bell/phone within reach   PT Visit Diagnosis: Muscle weakness (generalized) (M62.81);Pain     Time: 1120-1135 PT Time Calculation (min) (ACUTE ONLY): 15 min  Charges:  $Therapeutic Activity: 8-22 mins                    G Codes:          Michel Santee 02/15/2018, 11:41 AM

## 2018-02-16 ENCOUNTER — Inpatient Hospital Stay (HOSPITAL_COMMUNITY): Payer: Medicaid Other

## 2018-02-16 LAB — BASIC METABOLIC PANEL
Anion gap: 9 (ref 5–15)
BUN: 17 mg/dL (ref 6–20)
CO2: 23 mmol/L (ref 22–32)
CREATININE: 0.74 mg/dL (ref 0.44–1.00)
Calcium: 8.9 mg/dL (ref 8.9–10.3)
Chloride: 106 mmol/L (ref 101–111)
GFR calc non Af Amer: >60 mL/min (ref >60)
Glucose, Bld: 91 mg/dL (ref 65–99)
Potassium: 4.7 mmol/L (ref 3.5–5.1)
SODIUM: 138 mmol/L (ref 135–145)

## 2018-02-16 LAB — CBC
HEMATOCRIT: 29.7 % — AB (ref 36.0–46.0)
Hemoglobin: 9.4 g/dL — ABNORMAL LOW (ref 12.0–15.0)
MCH: 29.6 pg (ref 26.0–34.0)
MCHC: 31.6 g/dL (ref 30.0–36.0)
MCV: 93.4 fL (ref 78.0–100.0)
Platelets: 332 10*3/uL (ref 150–400)
RBC: 3.18 MIL/uL — ABNORMAL LOW (ref 3.87–5.11)
RDW: 15.9 % — ABNORMAL HIGH (ref 11.5–15.5)
WBC: 6.7 10*3/uL (ref 4.0–10.5)

## 2018-02-16 NOTE — Progress Notes (Signed)
Witnessed patient fall backwards out of wheelchair and hit back of head on the floor. Patient alert, oriented, moving all extremities. New bump noted on back of head. Patient chairlifted back to bed. [place] notified. Ordered STAT CT of head and spine. Patient down in CT now.   BP: 164/109 Temp: 98.3 Pulse: 72

## 2018-02-16 NOTE — Progress Notes (Signed)
PROGRESS NOTE        PATIENT DETAILS Name: [name] Age: 57 y.o. Sex: female Date of Birth: 12/19/1960 Admit Date: 02/02/2018 Admitting Physician Annita Brod, MD PCP:No primary care provider on file.  Brief Narrative: Patient is a 57 y.o. female prior history of cocaine use, hypertension, bilateral BKA-admitted with shortness of breath, found to have pneumonia, she was also found to have bilateral BKA stump infection-she was evaluated by orthopedics and underwent bilateral AKA.  Awaiting SNF placement.  Subjective: Pain stable at the bilateral stump site.  Assessment/Plan: Sepsis secondary to healthcare associated pneumonia and bilateral BKA stump infection with gangrene: Sepsis pathophysiology has resolved-underwent bilateral AKA-no longer an any antimicrobial therapy-levofloxacin was discontinued on 5/12.  Blood cultures negative.  Stable to be transferred to SNF when a bed is available-given prior history of drug use-difficult to place to SNF.    Bilateral BKA stump infection with gangrene: Status post bilateral AKA on 5/5-spoke with Dr. Sharol Given on 5/14-okay to remove a wound VAC and place a 4 x 4 dressing with Ace wraps.  Appears to have some phantom pain-I have started Neurontin.  Anemia: Multifactorial-probably secondary to acute illness-hemoglobin remained stable, has been transfused a total of 3 units of PRBC so far.  Hypertension: Controlled with Norvasc  Polysubstance abuse/cocaine use: Counseled  Severe protein calorie malnutrition: Continue supplements  DVT Prophylaxis: Prophylactic Lovenox   Code Status: Full code   Family Communication: None at bedside  Disposition Plan: Remain inpatient-family refused CIR-awaiting SNF placement.  Antimicrobial agents: Anti-infectives (From admission, onward)   Start     Dose/Rate Route Frequency Ordered Stop   02/09/18 1000  levofloxacin (LEVAQUIN) tablet 500 mg    Note to Pharmacy:  Dr  switching pt over to Levaquin 500 mg oral per note.   500 mg Oral Daily 02/08/18 1349 02/13/18 1033   02/08/18 1145  levofloxacin (LEVAQUIN) IVPB 500 mg  Status:  Discontinued     500 mg 100 mL/hr over 60 Minutes Intravenous Every 24 hours 02/08/18 1142 02/08/18 1351   02/08/18 0900  vancomycin (VANCOCIN) IVPB 1000 mg/200 mL premix  Status:  Discontinued     1,000 mg 200 mL/hr over 60 Minutes Intravenous Every 24 hours 02/07/18 1107 02/08/18 1142   02/07/18 0600  ceFEPIme (MAXIPIME) 1 g in sodium chloride 0.9 % 100 mL IVPB  Status:  Discontinued     1 g 200 mL/hr over 30 Minutes Intravenous Every 24 hours 02/06/18 1442 02/08/18 1142   02/06/18 0630  ceFAZolin (ANCEF) IVPB 2g/100 mL premix  Status:  Discontinued     2 g 200 mL/hr over 30 Minutes Intravenous To [facility]Stay [facility] 02/05/18 1035 02/06/18 0925   02/05/18 1015  ceFAZolin (ANCEF) IVPB 2g/100 mL premix     2 g 200 mL/hr over 30 Minutes Intravenous On call to O.R. 02/05/18 1011 02/06/18 0559   02/03/18 1800  vancomycin (VANCOCIN) 500 mg in sodium chloride 0.9 % 100 mL IVPB  Status:  Discontinued     500 mg 100 mL/hr over 60 Minutes Intravenous Every 24 hours 02/02/18 1847 02/02/18 1858   02/03/18 1000  vancomycin (VANCOCIN) IVPB 750 mg/150 ml premix  Status:  Discontinued     750 mg 150 mL/hr over 60 Minutes Intravenous Every 24 hours 02/02/18 1858 02/07/18 1107   02/02/18 2200  ceFEPIme (MAXIPIME) 1 g in sodium  chloride 0.9 % 100 mL IVPB  Status:  Discontinued     1 g 200 mL/hr over 30 Minutes Intravenous Every 12 hours 02/02/18 1847 02/02/18 1858   02/02/18 2200  ceFEPIme (MAXIPIME) 1 g in sodium chloride 0.9 % 100 mL IVPB  Status:  Discontinued     1 g 200 mL/hr over 30 Minutes Intravenous Every 8 hours 02/02/18 1858 02/06/18 1442   02/02/18 1845  ceFEPIme (MAXIPIME) 2 g in sodium chloride 0.9 % 100 mL IVPB  Status:  Discontinued     2 g 200 mL/hr over 30 Minutes Intravenous  Once 02/02/18 1830 02/02/18 1847   02/02/18 1845   vancomycin (VANCOCIN) IVPB 1000 mg/200 mL premix  Status:  Discontinued     1,000 mg 200 mL/hr over 60 Minutes Intravenous  Once 02/02/18 1830 02/02/18 1847   02/02/18 1500  vancomycin (VANCOCIN) IVPB 1000 mg/200 mL premix     1,000 mg 200 mL/hr over 60 Minutes Intravenous  Once 02/02/18 1445 02/02/18 1647   02/02/18 1500  piperacillin-tazobactam (ZOSYN) IVPB 3.375 g     3.375 g 100 mL/hr over 30 Minutes Intravenous  Once 02/02/18 1445 02/02/18 1546      Procedures: 5/5>> bilateral AKA  CONSULTS:  orthopedic surgery  Time spent: 25- minutes-Greater than 50% of this time was spent in counseling, explanation of diagnosis, planning of further management, and coordination of care.  MEDICATIONS: Scheduled Meds: . amLODipine  5 mg Oral Daily  . busPIRone  15 mg Oral TID  . docusate sodium  100 mg Oral BID  . enoxaparin (LOVENOX) injection  30 mg Subcutaneous Q24H  . escitalopram  20 mg Oral QHS  . feeding supplement (ENSURE ENLIVE)  237 mL Oral BID BM  . gabapentin  200 mg Oral BID  . multivitamin with minerals  1 tablet Oral Daily   Continuous Infusions: . methocarbamol (ROBAXIN)  IV     PRN Meds:.acetaminophen, alum & mag hydroxide-simeth, bisacodyl, diphenhydrAMINE, HYDROmorphone (DILAUDID) injection, ibuprofen, ipratropium-albuterol, magnesium citrate, methocarbamol **OR** methocarbamol (ROBAXIN)  IV, metoCLOPramide **OR** metoCLOPramide (REGLAN) injection, morphine injection, ondansetron **OR** ondansetron (ZOFRAN) IV, oxyCODONE, oxyCODONE, polyethylene glycol, zolpidem   PHYSICAL EXAM: Vital signs: Vitals:   02/15/18 0502 02/15/18 1343 02/15/18 2052 02/16/18 0051  BP: 93/62 (!) 89/53 (!) 89/53 122/72  Pulse: 68 (!) 102 85 82  Resp: 12 14 14 14   Temp: 97.9 F (36.6 C) 97.9 F (36.6 C) 98.6 F (37 C) 98.9 F (37.2 C)  TempSrc: Oral Oral Oral Oral  SpO2: 100% 100% 99% 100%  Weight:      Height:       Filed Weights   02/02/18 1636  Weight: 36.2 kg (79 lb 12.9 oz)    Body mass index is 15.08 kg/m.   General appearance :Awake, alert, not in any distress.  Eyes:, pupils equally reactive to light and accomodation,no scleral icterus. HEENT: Atraumatic and Normocephalic Neck: supple, no JVD. Resp:Good air entry bilaterally, no rales or rhonchi CVS: S1 S2 regular, no murmurs.  GI: Bowel sounds present, Non tender and not distended with no gaurding, rigidity or rebound. Extremities: B/L AKA Neurology:  speech clear,Non focal, sensation is grossly intact. Psychiatric: Normal judgment and insight. Normal mood. Musculoskeletal:No digital cyanosis Skin:No Rash, warm and dry Wounds:N/A  I have personally reviewed following labs and imaging studies  LABORATORY DATA: CBC: Recent Labs  Lab 02/11/18 0449 02/12/18 0355 02/16/18 0447  WBC 5.5 6.6 6.7  HGB 7.2* 9.1* 9.4*  HCT 22.3* 28.3* 29.7*  MCV  92.1 90.1 93.4  PLT 282 312 144    Basic Metabolic Panel: Recent Labs  Lab 02/11/18 0449 02/13/18 0431 02/15/18 0450 02/16/18 0447  NA 138  --   --  138  K 4.4  --   --  4.7  CL 105  --   --  106  CO2 25  --   --  23  GLUCOSE 79  --   --  91  BUN 11  --   --  17  CREATININE 0.84 0.83 0.93 0.74  CALCIUM 8.9  --   --  8.9    GFR: Estimated Creatinine Clearance: 45.9 mL/min (by C-G formula based on SCr of 0.74 mg/dL).  Liver Function Tests: No results for input(s): AST, ALT, ALKPHOS, BILITOT, PROT, ALBUMIN in the last 168 hours. No results for input(s): LIPASE, AMYLASE in the last 168 hours. No results for input(s): AMMONIA in the last 168 hours.  Coagulation Profile: No results for input(s): INR, PROTIME in the last 168 hours.  Cardiac Enzymes: No results for input(s): CKTOTAL, CKMB, CKMBINDEX, TROPONINI in the last 168 hours.  BNP (last 3 results) No results for input(s): PROBNP in the last 8760 hours.  HbA1C: No results for input(s): HGBA1C in the last 72 hours.  CBG: No results for input(s): GLUCAP in the last 168  hours.  Lipid Profile: No results for input(s): CHOL, HDL, LDLCALC, TRIG, CHOLHDL, LDLDIRECT in the last 72 hours.  Thyroid Function Tests: No results for input(s): TSH, T4TOTAL, FREET4, T3FREE, THYROIDAB in the last 72 hours.  Anemia Panel: No results for input(s): VITAMINB12, FOLATE, FERRITIN, TIBC, IRON, RETICCTPCT in the last 72 hours.  Urine analysis:    Component Value Date/Time   COLORURINE AMBER (A) 01/20/2018 1020   APPEARANCEUR HAZY (A) 01/20/2018 1020   LABSPEC 1.026 01/20/2018 1020   PHURINE 5.0 01/20/2018 1020   GLUCOSEU NEGATIVE 01/20/2018 1020   HGBUR LARGE (A) 01/20/2018 1020   BILIRUBINUR NEGATIVE 01/20/2018 1020   BILIRUBINUR small 04/29/2015 1533   KETONESUR NEGATIVE 01/20/2018 1020   PROTEINUR >=300 (A) 01/20/2018 1020   UROBILINOGEN 1.0 07/26/2017 1550   NITRITE NEGATIVE 01/20/2018 1020   LEUKOCYTESUR NEGATIVE 01/20/2018 1020    Sepsis Labs: Lactic Acid, Venous    Component Value Date/Time   LATICACIDVEN 1.1 02/03/2018 0013    MICROBIOLOGY: No results found for this or any previous visit (from the past 240 hour(s)).  RADIOLOGY STUDIES/RESULTS: Dg Chest 2 View  Addendum Date: 02/02/2018   ADDENDUM REPORT: 02/02/2018 14:33 ADDENDUM: Acute findings discussed with and reconfirmed by Dr.KEVIN CAMPOS on 02/02/2018 at 2:32 pm. Electronically Signed   By: Elon Alas M.D.   On: 02/02/2018 14:33   Result Date: 02/02/2018 CLINICAL DATA:  Mid chest pain, cough and congestion. EXAM: CHEST - 2 VIEW COMPARISON:  Chest radiograph January 20, 2018 FINDINGS: Multifocal RIGHT lung consolidation. Mild chronic interstitial changes without pleural effusion. Borderline cardiomegaly. Calcified aortic knob. No pneumothorax. Soft tissue planes included osseous structures are nonsuspicious. IMPRESSION: 1. Multifocal RIGHT pneumonia. 2. Borderline cardiomegaly. 3.  Aortic Atherosclerosis (ICD10-I70.0). Electronically Signed: By: Elon Alas M.D. On: 02/02/2018 14:28   Dg  Chest 2 View  Result Date: 01/20/2018 CLINICAL DATA:  Shortness of breath with cough and congestion EXAM: CHEST - 2 VIEW COMPARISON:  October 23, 2017 FINDINGS: There is scarring in the right upper lobe, stable. There is no edema or consolidation. Heart size and pulmonary vascularity are normal. No adenopathy. There is aortic atherosclerosis. No evident bone lesions. IMPRESSION:  Mild scarring right upper lobe, stable. No edema or consolidation. Stable cardiac silhouette. There is aortic atherosclerosis. Aortic Atherosclerosis (ICD10-I70.0). Electronically Signed   By: Lowella Grip III M.D.   On: 01/20/2018 09:51   Dg Knee 1-2 Views Left  Result Date: 02/02/2018 CLINICAL DATA:  Knee pain.  Status post BKA. EXAM: LEFT KNEE - 1-2 VIEW COMPARISON:  01/20/2018 FINDINGS: Bones are demineralized. No evidence for an acute fracture. No subluxation or dislocation. No worrisome lytic or sclerotic osseous abnormality. No gross joint effusion evident. There is some soft tissue irregularity at the stump. IMPRESSION: Osteopenia without acute bony abnormality. Soft tissue irregularity at the stump may be postoperative scarring although wound/ulcer could have this appearance. Electronically Signed   By: Misty Stanley M.D.   On: 02/02/2018 14:30   Dg Knee 1-2 Views Right  Result Date: 02/02/2018 CLINICAL DATA:  Pain and necrosis at stump, bleeding. EXAM: RIGHT KNEE - 1-2 VIEW COMPARISON:  RIGHT knee radiograph January 20, 2018 FINDINGS: Status post below-knee amputation. No acute osseous process. No rib fracture deformity or dislocation. No advanced degenerative change for age. Increasing soft tissue swelling with superficial irregularity and subcutaneous gas about the stump. IMPRESSION: Soft tissue swelling with new subcutaneous gas seen with necrotizing fasciitis or recent instrumentation. No acute osseous process. Acute findings discussed with and reconfirmed by Dr.KEVIN CAMPOS on 02/02/2018 at 2:32 pm. Electronically  Signed   By: Elon Alas M.D.   On: 02/02/2018 14:32   Dg Knee Complete 4 Views Left  Result Date: 01/20/2018 CLINICAL DATA:  Nonhealing wound at the left leg stump. EXAM: LEFT KNEE - COMPLETE 4+ VIEW COMPARISON:  12/17/2017 FINDINGS: Patient has a below the knee amputation. Stable appearance of the remaining tibia and fibula. There is no evidence for cortical irregularity or periosteal reaction at the amputation site. There is lucency and irregularity in the soft tissues at the stump. Left knee is located. IMPRESSION: No acute bone abnormality involving the amputation site. No radiographic findings for osteomyelitis. Irregularity involving the soft tissues compatible with the clinical history. Electronically Signed   By: Markus Daft M.D.   On: 01/20/2018 09:54   Dg Knee Complete 4 Views Right  Result Date: 01/20/2018 CLINICAL DATA:  Nonhealing wound at left stump EXAM: RIGHT KNEE - COMPLETE 4+ VIEW COMPARISON:  12/17/2017 FINDINGS: Prior below the knee amputation on the right. No bony changes to suggest osteomyelitis. No soft tissue gas. No acute bony abnormality. IMPRESSION: Right BKA.  No radiographic changes of osteomyelitis. Electronically Signed   By: Rolm Baptise M.D.   On: 01/20/2018 09:50     LOS: 14 days   Oren Binet, MD  [facility] Pager:[phone]  If 7PM-7AM, please contact night-coverage [email or web address] Password [facility] 02/16/2018, 11:58 AM

## 2018-02-17 LAB — CREATININE, SERUM
CREATININE: 0.75 mg/dL (ref 0.44–1.00)
GFR calc Af Amer: >60 mL/min (ref >60)

## 2018-02-17 NOTE — Progress Notes (Signed)
Nutrition Follow Up  DOCUMENTATION CODES:   Severe malnutrition in context of social or environmental circumstances, Underweight  INTERVENTION:    Continue Ensure Enlive po BID, each supplement provides 350 kcal and 20 grams of protein  NUTRITION DIAGNOSIS:   Severe Malnutrition related to social / environmental circumstances(cocaine use) as evidenced by severe muscle depletion, severe fat depletion, ongoing  GOAL:   Patient will meet greater than or equal to 90% of their needs, progressing   MONITOR:   PO intake, Supplement acceptance, Weight trends, Labs, Skin  ASSESSMENT:   57 y.o. 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 She arrived to the ED complaining of bilateral leg pain and trouble breathing which is been going on now for several days.  Chest x-ray noted signs of right sided pneumonia as well as soft tissue swelling with new subcutaneous gas seen in her right stump on x-ray.  Patient started on broad-spectrum IV antibiotics and fluid bolus. Patient also verbalized suicidal ideation saying that she wanted to kill herself and did not want to live this way although she did not specify a plan.  Pt s/p procedure 5/5: BILATERAL ABOVE KNEE AMPUTATION  Pt resting in bed upon RD visit.  Reports her appetite is "okay". PO intake improved at 75% per flowsheets. She is only drinking half (1/2) an Ensure Enlive supplement per day.  RD encouraged pt to try and drink one (1) full supplement. Medications include Reglan, MVI and Colace. Labs reviewed.  Diet Order:   Diet Order           Diet regular Room service appropriate? Yes; Fluid consistency: Thin  Diet effective now         EDUCATION NEEDS:   No education needs have been identified at this time  Skin:  Skin Assessment: Skin Integrity Issues: Skin Integrity Issues:: Wound  VAC Wound Vac: bilateral >> removed 5/14  Last BM:  5/15  Height:   Ht Readings from Last 1 Encounters:  02/02/18 5' 1"  (1.549 m)   Weight:   Wt Readings from Last 1 Encounters:  02/02/18 79 lb 12.9 oz (36.2 kg)   Ideal Body Weight:  47.27 kg  BMI:  Body mass index is 15.08 kg/m.  Estimated Nutritional Needs:   Kcal:  1300-1500  Protein:  60-75 gm  Fluid:  >/= 1.5 L  Arthur Holms, RD, LDN Pager #: [phone] After-Hours Pager #: [phone]

## 2018-02-17 NOTE — Progress Notes (Signed)
PROGRESS NOTE        PATIENT DETAILS Name: [name] Age: 57 y.o. Sex: female Date of Birth: 20-Sep-1961 Admit Date: 02/02/2018 Admitting Physician Annita Brod, MD PCP:No primary care provider on file.  Brief Narrative: Patient is a 57 y.o. female prior history of cocaine use, hypertension, bilateral BKA-admitted with shortness of breath, found to have pneumonia, she was also found to have bilateral BKA stump infection-she was evaluated by orthopedics and underwent bilateral AKA.  Awaiting SNF placement.  Subjective: Pain stable at the bilateral stump site.  Assessment/Plan: Sepsis secondary to healthcare associated pneumonia and bilateral BKA stump infection with gangrene: Sepsis pathophysiology has resolved-underwent bilateral AKA-no longer an any antimicrobial therapy-levofloxacin was discontinued on 5/12.  Blood cultures negative.  Stable to be transferred to SNF when a bed is available-given prior history of drug use-difficult to place to SNF.    Bilateral BKA stump infection with gangrene: Status post bilateral AKA on 5/5-spoke with Dr. Sharol Given on 5/14-okay to remove a wound VAC and place a 4 x 4 dressing with Ace wraps.  Appears to have some phantom pain-I have started Neurontin. Stump site looks benign.  Mechanical fall: Patient fell from a wheelchair yesterday and landed on her head-CT C-spine/CT head negative for acute abnormalities.  She has a mild scalp hematoma in the occipital area but otherwise appears stable.  Anemia: Multifactorial-probably secondary to acute illness-hemoglobin remained stable, has been transfused a total of 3 units of PRBC so far.  Hypertension: Controlled with Norvasc  Polysubstance abuse/cocaine use: Counseled  Severe protein calorie malnutrition: Continue supplements  DVT Prophylaxis: Prophylactic Lovenox   Code Status: Full code   Family Communication: None at bedside  Disposition Plan: Remain  inpatient-family refused CIR-awaiting SNF placement.  Antimicrobial agents: Anti-infectives (From admission, onward)   Start     Dose/Rate Route Frequency Ordered Stop   02/09/18 1000  levofloxacin (LEVAQUIN) tablet 500 mg    Note to Pharmacy:  Dr switching pt over to Levaquin 500 mg oral per note.   500 mg Oral Daily 02/08/18 1349 02/13/18 1033   02/08/18 1145  levofloxacin (LEVAQUIN) IVPB 500 mg  Status:  Discontinued     500 mg 100 mL/hr over 60 Minutes Intravenous Every 24 hours 02/08/18 1142 02/08/18 1351   02/08/18 0900  vancomycin (VANCOCIN) IVPB 1000 mg/200 mL premix  Status:  Discontinued     1,000 mg 200 mL/hr over 60 Minutes Intravenous Every 24 hours 02/07/18 1107 02/08/18 1142   02/07/18 0600  ceFEPIme (MAXIPIME) 1 g in sodium chloride 0.9 % 100 mL IVPB  Status:  Discontinued     1 g 200 mL/hr over 30 Minutes Intravenous Every 24 hours 02/06/18 1442 02/08/18 1142   02/06/18 0630  ceFAZolin (ANCEF) IVPB 2g/100 mL premix  Status:  Discontinued     2 g 200 mL/hr over 30 Minutes Intravenous To [facility] 02/05/18 1035 02/06/18 0925   02/05/18 1015  ceFAZolin (ANCEF) IVPB 2g/100 mL premix     2 g 200 mL/hr over 30 Minutes Intravenous On call to O.R. 02/05/18 1011 02/06/18 0559   02/03/18 1800  vancomycin (VANCOCIN) 500 mg in sodium chloride 0.9 % 100 mL IVPB  Status:  Discontinued     500 mg 100 mL/hr over 60 Minutes Intravenous Every 24 hours 02/02/18 1847 02/02/18 1858   02/03/18 1000  vancomycin (VANCOCIN)  IVPB 750 mg/150 ml premix  Status:  Discontinued     750 mg 150 mL/hr over 60 Minutes Intravenous Every 24 hours 02/02/18 1858 02/07/18 1107   02/02/18 2200  ceFEPIme (MAXIPIME) 1 g in sodium chloride 0.9 % 100 mL IVPB  Status:  Discontinued     1 g 200 mL/hr over 30 Minutes Intravenous Every 12 hours 02/02/18 1847 02/02/18 1858   02/02/18 2200  ceFEPIme (MAXIPIME) 1 g in sodium chloride 0.9 % 100 mL IVPB  Status:  Discontinued     1 g 200 mL/hr over 30 Minutes  Intravenous Every 8 hours 02/02/18 1858 02/06/18 1442   02/02/18 1845  ceFEPIme (MAXIPIME) 2 g in sodium chloride 0.9 % 100 mL IVPB  Status:  Discontinued     2 g 200 mL/hr over 30 Minutes Intravenous  Once 02/02/18 1830 02/02/18 1847   02/02/18 1845  vancomycin (VANCOCIN) IVPB 1000 mg/200 mL premix  Status:  Discontinued     1,000 mg 200 mL/hr over 60 Minutes Intravenous  Once 02/02/18 1830 02/02/18 1847   02/02/18 1500  vancomycin (VANCOCIN) IVPB 1000 mg/200 mL premix     1,000 mg 200 mL/hr over 60 Minutes Intravenous  Once 02/02/18 1445 02/02/18 1647   02/02/18 1500  piperacillin-tazobactam (ZOSYN) IVPB 3.375 g     3.375 g 100 mL/hr over 30 Minutes Intravenous  Once 02/02/18 1445 02/02/18 1546      Procedures: 5/5>> bilateral AKA  CONSULTS:  orthopedic surgery  Time spent: 25- minutes-Greater than 50% of this time was spent in counseling, explanation of diagnosis, planning of further management, and coordination of care.  MEDICATIONS: Scheduled Meds: . amLODipine  5 mg Oral Daily  . busPIRone  15 mg Oral TID  . docusate sodium  100 mg Oral BID  . enoxaparin (LOVENOX) injection  30 mg Subcutaneous Q24H  . escitalopram  20 mg Oral QHS  . feeding supplement (ENSURE ENLIVE)  237 mL Oral BID BM  . gabapentin  200 mg Oral BID  . multivitamin with minerals  1 tablet Oral Daily   Continuous Infusions: . methocarbamol (ROBAXIN)  IV     PRN Meds:.acetaminophen, alum & mag hydroxide-simeth, bisacodyl, diphenhydrAMINE, HYDROmorphone (DILAUDID) injection, ibuprofen, ipratropium-albuterol, magnesium citrate, methocarbamol **OR** methocarbamol (ROBAXIN)  IV, metoCLOPramide **OR** metoCLOPramide (REGLAN) injection, morphine injection, ondansetron **OR** ondansetron (ZOFRAN) IV, oxyCODONE, oxyCODONE, polyethylene glycol, zolpidem   PHYSICAL EXAM: Vital signs: Vitals:   02/16/18 2207 02/17/18 0034 02/17/18 0245 02/17/18 0532  BP: (!) 100/54 100/60 (!) 90/58 102/60  Pulse: 86 74 77 72    Resp: 18 17 16 16   Temp: 99.1 F (37.3 C) 98.2 F (36.8 C) 98 F (36.7 C) 97.6 F (36.4 C)  TempSrc: Oral Oral Oral Axillary  SpO2: 99% 100% 100% 98%  Weight:      Height:       Filed Weights   02/02/18 1636  Weight: 36.2 kg (79 lb 12.9 oz)   Body mass index is 15.08 kg/m.   General appearance :Awake, alert, not in any distress.  Eyes:, pupils equally reactive to light and accomodation,no scleral icterus. HEENT: Atraumatic and Normocephalic Neck: supple, no JVD. Resp:Good air entry bilaterally, no rales or rhonchi CVS: S1 S2 regular, no murmurs.  GI: Bowel sounds present, Non tender and not distended with no gaurding, rigidity or rebound. Extremities: B/L AKA Neurology:  speech clear,Non focal, sensation is grossly intact. Psychiatric: Normal judgment and insight. Normal mood. Musculoskeletal:No digital cyanosis Skin:No Rash, warm and dry Wounds:N/A  I  have personally reviewed following labs and imaging studies  LABORATORY DATA: CBC: Recent Labs  Lab 02/11/18 0449 02/12/18 0355 02/16/18 0447  WBC 5.5 6.6 6.7  HGB 7.2* 9.1* 9.4*  HCT 22.3* 28.3* 29.7*  MCV 92.1 90.1 93.4  PLT 282 312 600    Basic Metabolic Panel: Recent Labs  Lab 02/11/18 0449 02/13/18 0431 02/15/18 0450 02/16/18 0447 02/17/18 0434  NA 138  --   --  138  --   K 4.4  --   --  4.7  --   CL 105  --   --  106  --   CO2 25  --   --  23  --   GLUCOSE 79  --   --  91  --   BUN 11  --   --  17  --   CREATININE 0.84 0.83 0.93 0.74 0.75  CALCIUM 8.9  --   --  8.9  --     GFR: Estimated Creatinine Clearance: 45.9 mL/min (by C-G formula based on SCr of 0.75 mg/dL).  Liver Function Tests: No results for input(s): AST, ALT, ALKPHOS, BILITOT, PROT, ALBUMIN in the last 168 hours. No results for input(s): LIPASE, AMYLASE in the last 168 hours. No results for input(s): AMMONIA in the last 168 hours.  Coagulation Profile: No results for input(s): INR, PROTIME in the last 168 hours.  Cardiac  Enzymes: No results for input(s): CKTOTAL, CKMB, CKMBINDEX, TROPONINI in the last 168 hours.  BNP (last 3 results) No results for input(s): PROBNP in the last 8760 hours.  HbA1C: No results for input(s): HGBA1C in the last 72 hours.  CBG: No results for input(s): GLUCAP in the last 168 hours.  Lipid Profile: No results for input(s): CHOL, HDL, LDLCALC, TRIG, CHOLHDL, LDLDIRECT in the last 72 hours.  Thyroid Function Tests: No results for input(s): TSH, T4TOTAL, FREET4, T3FREE, THYROIDAB in the last 72 hours.  Anemia Panel: No results for input(s): VITAMINB12, FOLATE, FERRITIN, TIBC, IRON, RETICCTPCT in the last 72 hours.  Urine analysis:    Component Value Date/Time   COLORURINE AMBER (A) 01/20/2018 1020   APPEARANCEUR HAZY (A) 01/20/2018 1020   LABSPEC 1.026 01/20/2018 1020   PHURINE 5.0 01/20/2018 1020   GLUCOSEU NEGATIVE 01/20/2018 1020   HGBUR LARGE (A) 01/20/2018 1020   BILIRUBINUR NEGATIVE 01/20/2018 1020   BILIRUBINUR small 04/29/2015 1533   KETONESUR NEGATIVE 01/20/2018 1020   PROTEINUR >=300 (A) 01/20/2018 1020   UROBILINOGEN 1.0 07/26/2017 1550   NITRITE NEGATIVE 01/20/2018 1020   LEUKOCYTESUR NEGATIVE 01/20/2018 1020    Sepsis Labs: Lactic Acid, Venous    Component Value Date/Time   LATICACIDVEN 1.1 02/03/2018 0013    MICROBIOLOGY: No results found for this or any previous visit (from the past 240 hour(s)).  RADIOLOGY STUDIES/RESULTS: Dg Chest 2 View  Addendum Date: 02/02/2018   ADDENDUM REPORT: 02/02/2018 14:33 ADDENDUM: Acute findings discussed with and reconfirmed by Dr.KEVIN CAMPOS on 02/02/2018 at 2:32 pm. Electronically Signed   By: Elon Alas M.D.   On: 02/02/2018 14:33   Result Date: 02/02/2018 CLINICAL DATA:  Mid chest pain, cough and congestion. EXAM: CHEST - 2 VIEW COMPARISON:  Chest radiograph January 20, 2018 FINDINGS: Multifocal RIGHT lung consolidation. Mild chronic interstitial changes without pleural effusion. Borderline cardiomegaly.  Calcified aortic knob. No pneumothorax. Soft tissue planes included osseous structures are nonsuspicious. IMPRESSION: 1. Multifocal RIGHT pneumonia. 2. Borderline cardiomegaly. 3.  Aortic Atherosclerosis (ICD10-I70.0). Electronically Signed: By: Elon Alas M.D. On: 02/02/2018 14:28  Dg Chest 2 View  Result Date: 01/20/2018 CLINICAL DATA:  Shortness of breath with cough and congestion EXAM: CHEST - 2 VIEW COMPARISON:  October 23, 2017 FINDINGS: There is scarring in the right upper lobe, stable. There is no edema or consolidation. Heart size and pulmonary vascularity are normal. No adenopathy. There is aortic atherosclerosis. No evident bone lesions. IMPRESSION: Mild scarring right upper lobe, stable. No edema or consolidation. Stable cardiac silhouette. There is aortic atherosclerosis. Aortic Atherosclerosis (ICD10-I70.0). Electronically Signed   By: Lowella Grip III M.D.   On: 01/20/2018 09:51   Dg Knee 1-2 Views Left  Result Date: 02/02/2018 CLINICAL DATA:  Knee pain.  Status post BKA. EXAM: LEFT KNEE - 1-2 VIEW COMPARISON:  01/20/2018 FINDINGS: Bones are demineralized. No evidence for an acute fracture. No subluxation or dislocation. No worrisome lytic or sclerotic osseous abnormality. No gross joint effusion evident. There is some soft tissue irregularity at the stump. IMPRESSION: Osteopenia without acute bony abnormality. Soft tissue irregularity at the stump may be postoperative scarring although wound/ulcer could have this appearance. Electronically Signed   By: Misty Stanley M.D.   On: 02/02/2018 14:30   Dg Knee 1-2 Views Right  Result Date: 02/02/2018 CLINICAL DATA:  Pain and necrosis at stump, bleeding. EXAM: RIGHT KNEE - 1-2 VIEW COMPARISON:  RIGHT knee radiograph January 20, 2018 FINDINGS: Status post below-knee amputation. No acute osseous process. No rib fracture deformity or dislocation. No advanced degenerative change for age. Increasing soft tissue swelling with superficial  irregularity and subcutaneous gas about the stump. IMPRESSION: Soft tissue swelling with new subcutaneous gas seen with necrotizing fasciitis or recent instrumentation. No acute osseous process. Acute findings discussed with and reconfirmed by Dr.KEVIN CAMPOS on 02/02/2018 at 2:32 pm. Electronically Signed   By: Elon Alas M.D.   On: 02/02/2018 14:32   Ct Head Wo Contrast  Result Date: 02/16/2018 CLINICAL DATA:  Pain following fall EXAM: CT HEAD WITHOUT CONTRAST CT CERVICAL SPINE WITHOUT CONTRAST TECHNIQUE: Multidetector CT imaging of the head and cervical spine was performed following the standard protocol without intravenous contrast. Multiplanar CT image reconstructions of the cervical spine were also generated. COMPARISON:  Head CT June 08, 2009 FINDINGS: CT HEAD FINDINGS Brain: The ventricles are normal in size and configuration. There is no intracranial mass, hemorrhage, extra-axial fluid collection, or midline shift. Gray-white compartments appear normal. No evident acute infarct. Vascular: There is no hyperdense vessel. There is calcification in each carotid siphon region. Skull: Bony calvarium appears intact. There is a right parietal scalp hematoma. Sinuses/Orbits: There is mucosal thickening in several ethmoid air cells. Other visualized paranasal sinuses are clear. Visualized orbits appear symmetric bilaterally. Note that there is increased attenuation in each globe which potentially may be artifactual. Other: Mastoid air cells are clear. CT CERVICAL SPINE FINDINGS Alignment: There is no appreciable spondylolisthesis. Skull base and vertebrae: Skull base and craniocervical junction regions appear normal. No evident fracture. No blastic or lytic bone lesions. Soft tissues and spinal canal: Prevertebral soft tissues and predental space regions are normal. No paraspinous lesions evident. No cord canal hematoma appreciable. Disc levels: There is moderate disc space narrowing at C5-6 and C6-7.  There are anterior osteophytes at C5 and C6. There is a calcified central and left paracentral disc protrusion at C6-7 with mild impression on the ventral cord at this level. There is mild facet hypertrophy at several levels. Upper chest: Visualized upper lung zones are clear. There is a degree of underlying centrilobular emphysematous change. Other: There  are foci of calcification in each carotid and subclavian artery. IMPRESSION: CT head: 1. No mass or hemorrhage. Gray-white compartments appear normal. Note that there is a right parietal scalp hematoma. No fracture evident. 2.  Foci of arterial vascular calcification noted. 3.  Mucosal thickening in several ethmoid air cells. 4. Increased attenuation in both globes may well be artifactual. If patient is reporting vision difficulty, ophthalmological examination in this regard may be reasonable. CT cervical spine: No fracture or spondylolisthesis. Arthropathy at several levels with a calcified central and left paracentral disc protrusion at C6-7. No high-grade stenosis. Foci of calcification in subclavian and carotid arteries noted. Underlying centrilobular emphysematous change. Emphysema (ICD10-J43.9). Electronically Signed   By: Lowella Grip III M.D.   On: 02/16/2018 13:18   Ct Cervical Spine Wo Contrast  Result Date: 02/16/2018 CLINICAL DATA:  Pain following fall EXAM: CT HEAD WITHOUT CONTRAST CT CERVICAL SPINE WITHOUT CONTRAST TECHNIQUE: Multidetector CT imaging of the head and cervical spine was performed following the standard protocol without intravenous contrast. Multiplanar CT image reconstructions of the cervical spine were also generated. COMPARISON:  Head CT June 08, 2009 FINDINGS: CT HEAD FINDINGS Brain: The ventricles are normal in size and configuration. There is no intracranial mass, hemorrhage, extra-axial fluid collection, or midline shift. Gray-white compartments appear normal. No evident acute infarct. Vascular: There is no  hyperdense vessel. There is calcification in each carotid siphon region. Skull: Bony calvarium appears intact. There is a right parietal scalp hematoma. Sinuses/Orbits: There is mucosal thickening in several ethmoid air cells. Other visualized paranasal sinuses are clear. Visualized orbits appear symmetric bilaterally. Note that there is increased attenuation in each globe which potentially may be artifactual. Other: Mastoid air cells are clear. CT CERVICAL SPINE FINDINGS Alignment: There is no appreciable spondylolisthesis. Skull base and vertebrae: Skull base and craniocervical junction regions appear normal. No evident fracture. No blastic or lytic bone lesions. Soft tissues and spinal canal: Prevertebral soft tissues and predental space regions are normal. No paraspinous lesions evident. No cord canal hematoma appreciable. Disc levels: There is moderate disc space narrowing at C5-6 and C6-7. There are anterior osteophytes at C5 and C6. There is a calcified central and left paracentral disc protrusion at C6-7 with mild impression on the ventral cord at this level. There is mild facet hypertrophy at several levels. Upper chest: Visualized upper lung zones are clear. There is a degree of underlying centrilobular emphysematous change. Other: There are foci of calcification in each carotid and subclavian artery. IMPRESSION: CT head: 1. No mass or hemorrhage. Gray-white compartments appear normal. Note that there is a right parietal scalp hematoma. No fracture evident. 2.  Foci of arterial vascular calcification noted. 3.  Mucosal thickening in several ethmoid air cells. 4. Increased attenuation in both globes may well be artifactual. If patient is reporting vision difficulty, ophthalmological examination in this regard may be reasonable. CT cervical spine: No fracture or spondylolisthesis. Arthropathy at several levels with a calcified central and left paracentral disc protrusion at C6-7. No high-grade stenosis. Foci  of calcification in subclavian and carotid arteries noted. Underlying centrilobular emphysematous change. Emphysema (ICD10-J43.9). Electronically Signed   By: Lowella Grip III M.D.   On: 02/16/2018 13:18   Dg Knee Complete 4 Views Left  Result Date: 01/20/2018 CLINICAL DATA:  Nonhealing wound at the left leg stump. EXAM: LEFT KNEE - COMPLETE 4+ VIEW COMPARISON:  12/17/2017 FINDINGS: Patient has a below the knee amputation. Stable appearance of the remaining tibia and fibula. There is no  evidence for cortical irregularity or periosteal reaction at the amputation site. There is lucency and irregularity in the soft tissues at the stump. Left knee is located. IMPRESSION: No acute bone abnormality involving the amputation site. No radiographic findings for osteomyelitis. Irregularity involving the soft tissues compatible with the clinical history. Electronically Signed   By: Markus Daft M.D.   On: 01/20/2018 09:54   Dg Knee Complete 4 Views Right  Result Date: 01/20/2018 CLINICAL DATA:  Nonhealing wound at left stump EXAM: RIGHT KNEE - COMPLETE 4+ VIEW COMPARISON:  12/17/2017 FINDINGS: Prior below the knee amputation on the right. No bony changes to suggest osteomyelitis. No soft tissue gas. No acute bony abnormality. IMPRESSION: Right BKA.  No radiographic changes of osteomyelitis. Electronically Signed   By: Rolm Baptise M.D.   On: 01/20/2018 09:50     LOS: 15 days   Oren Binet, MD  [facility] Pager:[phone]  If 7PM-7AM, please contact night-coverage [email or web address] Password [facility] 02/17/2018, 2:14 PM

## 2018-02-18 NOTE — Social Work (Addendum)
CSW contacted admission staff at [facility] and was advised that their administrator declined to offer a bed for placement.  CSW discussed again with admissions staff at [facility] and [facility] to see if they would reconsider a SNF bed. They still declined patient.  CSW f/u for placement.  1:22pm: CSW contacted [facility] and left message for [place] in admissions. CSW then faxed clinicals for her to review.  CSW continuing to follow up for placement.  Elissa Hefty, LCSW Clinical Social Worker [phone]

## 2018-02-18 NOTE — Progress Notes (Signed)
PROGRESS NOTE        PATIENT DETAILS Name: [name] Age: 57 y.o. Sex: female Date of Birth: [birth year] Admit Date: 02/02/2018 Admitting Physician Annita Brod, MD PCP:No primary care provider on file.  Brief Narrative: Patient is a 57 y.o. female prior history of cocaine use, hypertension, bilateral BKA-admitted with shortness of breath, found to have pneumonia, she was also found to have bilateral BKA stump infection-she was evaluated by orthopedics and underwent bilateral AKA.  Awaiting SNF placement.  Subjective: Pain stable at the bilateral stump site.Lying comfortably in bed-denies any chest pain or SOB  Assessment/Plan: Sepsis secondary to healthcare associated pneumonia and bilateral BKA stump infection with gangrene: Sepsis pathophysiology has resolved-underwent bilateral AKA-no longer an any antimicrobial therapy-levofloxacin was discontinued on 5/12.  Blood cultures negative.  Stable to be transferred to SNF when a bed is available-given prior history of drug use-difficult to place to SNF.    Bilateral BKA stump infection with gangrene: Status post bilateral AKA on 5/5-spoke with Dr. Sharol Given on 5/14-okay to remove a wound VAC and place a 4 x 4 dressing with Ace wraps.  Appears to have some phantom pain-I have started Neurontin. Stump site looks benign.  Mechanical fall: Patient fell from a wheelchair yesterday and landed on her head-CT C-spine/CT head negative for acute abnormalities.  She has a mild scalp hematoma in the occipital area but otherwise appears stable.  Anemia: Multifactorial-probably secondary to acute illness-hemoglobin remained stable, has been transfused a total of 3 units of PRBC so far.  Hypertension: Controlled with Norvasc  Polysubstance abuse/cocaine use: Counseled  Severe protein calorie malnutrition: Continue supplements  DVT Prophylaxis: Prophylactic Lovenox   Code Status: Full code   Family Communication: None  at bedside  Disposition Plan: Remain inpatient-family refused CIR-awaiting SNF placement.  Antimicrobial agents: Anti-infectives (From admission, onward)   Start     Dose/Rate Route Frequency Ordered Stop   02/09/18 1000  levofloxacin (LEVAQUIN) tablet 500 mg    Note to Pharmacy:  Dr switching pt over to Levaquin 500 mg oral per note.   500 mg Oral Daily 02/08/18 1349 02/13/18 1033   02/08/18 1145  levofloxacin (LEVAQUIN) IVPB 500 mg  Status:  Discontinued     500 mg 100 mL/hr over 60 Minutes Intravenous Every 24 hours 02/08/18 1142 02/08/18 1351   02/08/18 0900  vancomycin (VANCOCIN) IVPB 1000 mg/200 mL premix  Status:  Discontinued     1,000 mg 200 mL/hr over 60 Minutes Intravenous Every 24 hours 02/07/18 1107 02/08/18 1142   02/07/18 0600  ceFEPIme (MAXIPIME) 1 g in sodium chloride 0.9 % 100 mL IVPB  Status:  Discontinued     1 g 200 mL/hr over 30 Minutes Intravenous Every 24 hours 02/06/18 1442 02/08/18 1142   02/06/18 0630  ceFAZolin (ANCEF) IVPB 2g/100 mL premix  Status:  Discontinued     2 g 200 mL/hr over 30 Minutes Intravenous To ShortStay [facility] 02/05/18 1035 02/06/18 0925   02/05/18 1015  ceFAZolin (ANCEF) IVPB 2g/100 mL premix     2 g 200 mL/hr over 30 Minutes Intravenous On call to O.R. 02/05/18 1011 02/06/18 0559   02/03/18 1800  vancomycin (VANCOCIN) 500 mg in sodium chloride 0.9 % 100 mL IVPB  Status:  Discontinued     500 mg 100 mL/hr over 60 Minutes Intravenous Every 24 hours 02/02/18 1847 02/02/18  1858   02/03/18 1000  vancomycin (VANCOCIN) IVPB 750 mg/150 ml premix  Status:  Discontinued     750 mg 150 mL/hr over 60 Minutes Intravenous Every 24 hours 02/02/18 1858 02/07/18 1107   02/02/18 2200  ceFEPIme (MAXIPIME) 1 g in sodium chloride 0.9 % 100 mL IVPB  Status:  Discontinued     1 g 200 mL/hr over 30 Minutes Intravenous Every 12 hours 02/02/18 1847 02/02/18 1858   02/02/18 2200  ceFEPIme (MAXIPIME) 1 g in sodium chloride 0.9 % 100 mL IVPB  Status:   Discontinued     1 g 200 mL/hr over 30 Minutes Intravenous Every 8 hours 02/02/18 1858 02/06/18 1442   02/02/18 1845  ceFEPIme (MAXIPIME) 2 g in sodium chloride 0.9 % 100 mL IVPB  Status:  Discontinued     2 g 200 mL/hr over 30 Minutes Intravenous  Once 02/02/18 1830 02/02/18 1847   02/02/18 1845  vancomycin (VANCOCIN) IVPB 1000 mg/200 mL premix  Status:  Discontinued     1,000 mg 200 mL/hr over 60 Minutes Intravenous  Once 02/02/18 1830 02/02/18 1847   02/02/18 1500  vancomycin (VANCOCIN) IVPB 1000 mg/200 mL premix     1,000 mg 200 mL/hr over 60 Minutes Intravenous  Once 02/02/18 1445 02/02/18 1647   02/02/18 1500  piperacillin-tazobactam (ZOSYN) IVPB 3.375 g     3.375 g 100 mL/hr over 30 Minutes Intravenous  Once 02/02/18 1445 02/02/18 1546      Procedures: 5/5>> bilateral AKA  CONSULTS:  orthopedic surgery  Time spent: 25- minutes-Greater than 50% of this time was spent in counseling, explanation of diagnosis, planning of further management, and coordination of care.  MEDICATIONS: Scheduled Meds: . amLODipine  5 mg Oral Daily  . busPIRone  15 mg Oral TID  . docusate sodium  100 mg Oral BID  . enoxaparin (LOVENOX) injection  30 mg Subcutaneous Q24H  . escitalopram  20 mg Oral QHS  . feeding supplement (ENSURE ENLIVE)  237 mL Oral BID BM  . gabapentin  200 mg Oral BID  . multivitamin with minerals  1 tablet Oral Daily   Continuous Infusions: . methocarbamol (ROBAXIN)  IV     PRN Meds:.acetaminophen, alum & mag hydroxide-simeth, bisacodyl, diphenhydrAMINE, HYDROmorphone (DILAUDID) injection, ibuprofen, ipratropium-albuterol, magnesium citrate, methocarbamol **OR** methocarbamol (ROBAXIN)  IV, metoCLOPramide **OR** metoCLOPramide (REGLAN) injection, morphine injection, ondansetron **OR** ondansetron (ZOFRAN) IV, oxyCODONE, oxyCODONE, polyethylene glycol, zolpidem   PHYSICAL EXAM: Vital signs: Vitals:   02/17/18 0532 02/17/18 1434 02/17/18 2131 02/18/18 0545  BP: 102/60  112/73 115/70 98/85  Pulse: 72 61 (!) 59 63  Resp: 16 18 15 17   Temp: 97.6 F (36.4 C) 97.9 F (36.6 C) (!) 97.5 F (36.4 C) 97.7 F (36.5 C)  TempSrc: Axillary Oral Oral Oral  SpO2: 98% 100% 100% 96%  Weight:      Height:       Filed Weights   02/02/18 1636  Weight: 36.2 kg (79 lb 12.9 oz)   Body mass index is 15.08 kg/m.   General appearance :Awake, alert, not in any distress.  Eyes:, pupils equally reactive to light and accomodation,no scleral icterus. HEENT: Atraumatic and Normocephalic Neck: supple, no JVD. Resp:Good air entry bilaterally, no rales or rhonchi CVS: S1 S2 regular, no murmurs.  GI: Bowel sounds present, Non tender and not distended with no gaurding, rigidity or rebound. Extremities: B/L AKA-staples in place-no active discharge.  Neurology:  speech clear,Non focal, sensation is grossly intact. Psychiatric: Normal judgment and insight. Normal  mood. Musculoskeletal:No digital cyanosis Skin:No Rash, warm and dry Wounds:N/A  I have personally reviewed following labs and imaging studies  LABORATORY DATA: CBC: Recent Labs  Lab 02/12/18 0355 02/16/18 0447  WBC 6.6 6.7  HGB 9.1* 9.4*  HCT 28.3* 29.7*  MCV 90.1 93.4  PLT 312 097    Basic Metabolic Panel: Recent Labs  Lab 02/13/18 0431 02/15/18 0450 02/16/18 0447 02/17/18 0434  NA  --   --  138  --   K  --   --  4.7  --   CL  --   --  106  --   CO2  --   --  23  --   GLUCOSE  --   --  91  --   BUN  --   --  17  --   CREATININE 0.83 0.93 0.74 0.75  CALCIUM  --   --  8.9  --     GFR: Estimated Creatinine Clearance: 45.9 mL/min (by C-G formula based on SCr of 0.75 mg/dL).  Liver Function Tests: No results for input(s): AST, ALT, ALKPHOS, BILITOT, PROT, ALBUMIN in the last 168 hours. No results for input(s): LIPASE, AMYLASE in the last 168 hours. No results for input(s): AMMONIA in the last 168 hours.  Coagulation Profile: No results for input(s): INR, PROTIME in the last 168  hours.  Cardiac Enzymes: No results for input(s): CKTOTAL, CKMB, CKMBINDEX, TROPONINI in the last 168 hours.  BNP (last 3 results) No results for input(s): PROBNP in the last 8760 hours.  HbA1C: No results for input(s): HGBA1C in the last 72 hours.  CBG: No results for input(s): GLUCAP in the last 168 hours.  Lipid Profile: No results for input(s): CHOL, HDL, LDLCALC, TRIG, CHOLHDL, LDLDIRECT in the last 72 hours.  Thyroid Function Tests: No results for input(s): TSH, T4TOTAL, FREET4, T3FREE, THYROIDAB in the last 72 hours.  Anemia Panel: No results for input(s): VITAMINB12, FOLATE, FERRITIN, TIBC, IRON, RETICCTPCT in the last 72 hours.  Urine analysis:    Component Value Date/Time   COLORURINE AMBER (A) 01/20/2018 1020   APPEARANCEUR HAZY (A) 01/20/2018 1020   LABSPEC 1.026 01/20/2018 1020   PHURINE 5.0 01/20/2018 1020   GLUCOSEU NEGATIVE 01/20/2018 1020   HGBUR LARGE (A) 01/20/2018 1020   BILIRUBINUR NEGATIVE 01/20/2018 1020   BILIRUBINUR small 04/29/2015 1533   KETONESUR NEGATIVE 01/20/2018 1020   PROTEINUR >=300 (A) 01/20/2018 1020   UROBILINOGEN 1.0 07/26/2017 1550   NITRITE NEGATIVE 01/20/2018 1020   LEUKOCYTESUR NEGATIVE 01/20/2018 1020    Sepsis Labs: Lactic Acid, Venous    Component Value Date/Time   LATICACIDVEN 1.1 02/03/2018 0013    MICROBIOLOGY: No results found for this or any previous visit (from the past 240 hour(s)).  RADIOLOGY STUDIES/RESULTS: Dg Chest 2 View  Addendum Date: 02/02/2018   ADDENDUM REPORT: 02/02/2018 14:33 ADDENDUM: Acute findings discussed with and reconfirmed by Dr.KEVIN CAMPOS on 02/02/2018 at 2:32 pm. Electronically Signed   By: Elon Alas M.D.   On: 02/02/2018 14:33   Result Date: 02/02/2018 CLINICAL DATA:  Mid chest pain, cough and congestion. EXAM: CHEST - 2 VIEW COMPARISON:  Chest radiograph January 20, 2018 FINDINGS: Multifocal RIGHT lung consolidation. Mild chronic interstitial changes without pleural effusion.  Borderline cardiomegaly. Calcified aortic knob. No pneumothorax. Soft tissue planes included osseous structures are nonsuspicious. IMPRESSION: 1. Multifocal RIGHT pneumonia. 2. Borderline cardiomegaly. 3.  Aortic Atherosclerosis (ICD10-I70.0). Electronically Signed: By: Elon Alas M.D. On: 02/02/2018 14:28   Dg Chest 2 View  Result Date: 01/20/2018 CLINICAL DATA:  Shortness of breath with cough and congestion EXAM: CHEST - 2 VIEW COMPARISON:  October 23, 2017 FINDINGS: There is scarring in the right upper lobe, stable. There is no edema or consolidation. Heart size and pulmonary vascularity are normal. No adenopathy. There is aortic atherosclerosis. No evident bone lesions. IMPRESSION: Mild scarring right upper lobe, stable. No edema or consolidation. Stable cardiac silhouette. There is aortic atherosclerosis. Aortic Atherosclerosis (ICD10-I70.0). Electronically Signed   By: Lowella Grip III M.D.   On: 01/20/2018 09:51   Dg Knee 1-2 Views Left  Result Date: 02/02/2018 CLINICAL DATA:  Knee pain.  Status post BKA. EXAM: LEFT KNEE - 1-2 VIEW COMPARISON:  01/20/2018 FINDINGS: Bones are demineralized. No evidence for an acute fracture. No subluxation or dislocation. No worrisome lytic or sclerotic osseous abnormality. No gross joint effusion evident. There is some soft tissue irregularity at the stump. IMPRESSION: Osteopenia without acute bony abnormality. Soft tissue irregularity at the stump may be postoperative scarring although wound/ulcer could have this appearance. Electronically Signed   By: Misty Stanley M.D.   On: 02/02/2018 14:30   Dg Knee 1-2 Views Right  Result Date: 02/02/2018 CLINICAL DATA:  Pain and necrosis at stump, bleeding. EXAM: RIGHT KNEE - 1-2 VIEW COMPARISON:  RIGHT knee radiograph January 20, 2018 FINDINGS: Status post below-knee amputation. No acute osseous process. No rib fracture deformity or dislocation. No advanced degenerative change for age. Increasing soft tissue  swelling with superficial irregularity and subcutaneous gas about the stump. IMPRESSION: Soft tissue swelling with new subcutaneous gas seen with necrotizing fasciitis or recent instrumentation. No acute osseous process. Acute findings discussed with and reconfirmed by Dr.KEVIN CAMPOS on 02/02/2018 at 2:32 pm. Electronically Signed   By: Elon Alas M.D.   On: 02/02/2018 14:32   Ct Head Wo Contrast  Result Date: 02/16/2018 CLINICAL DATA:  Pain following fall EXAM: CT HEAD WITHOUT CONTRAST CT CERVICAL SPINE WITHOUT CONTRAST TECHNIQUE: Multidetector CT imaging of the head and cervical spine was performed following the standard protocol without intravenous contrast. Multiplanar CT image reconstructions of the cervical spine were also generated. COMPARISON:  Head CT June 08, 2009 FINDINGS: CT HEAD FINDINGS Brain: The ventricles are normal in size and configuration. There is no intracranial mass, hemorrhage, extra-axial fluid collection, or midline shift. Gray-white compartments appear normal. No evident acute infarct. Vascular: There is no hyperdense vessel. There is calcification in each carotid siphon region. Skull: Bony calvarium appears intact. There is a right parietal scalp hematoma. Sinuses/Orbits: There is mucosal thickening in several ethmoid air cells. Other visualized paranasal sinuses are clear. Visualized orbits appear symmetric bilaterally. Note that there is increased attenuation in each globe which potentially may be artifactual. Other: Mastoid air cells are clear. CT CERVICAL SPINE FINDINGS Alignment: There is no appreciable spondylolisthesis. Skull base and vertebrae: Skull base and craniocervical junction regions appear normal. No evident fracture. No blastic or lytic bone lesions. Soft tissues and spinal canal: Prevertebral soft tissues and predental space regions are normal. No paraspinous lesions evident. No cord canal hematoma appreciable. Disc levels: There is moderate disc space  narrowing at C5-6 and C6-7. There are anterior osteophytes at C5 and C6. There is a calcified central and left paracentral disc protrusion at C6-7 with mild impression on the ventral cord at this level. There is mild facet hypertrophy at several levels. Upper chest: Visualized upper lung zones are clear. There is a degree of underlying centrilobular emphysematous change. Other: There are foci of calcification in  each carotid and subclavian artery. IMPRESSION: CT head: 1. No mass or hemorrhage. Gray-white compartments appear normal. Note that there is a right parietal scalp hematoma. No fracture evident. 2.  Foci of arterial vascular calcification noted. 3.  Mucosal thickening in several ethmoid air cells. 4. Increased attenuation in both globes may well be artifactual. If patient is reporting vision difficulty, ophthalmological examination in this regard may be reasonable. CT cervical spine: No fracture or spondylolisthesis. Arthropathy at several levels with a calcified central and left paracentral disc protrusion at C6-7. No high-grade stenosis. Foci of calcification in subclavian and carotid arteries noted. Underlying centrilobular emphysematous change. Emphysema (ICD10-J43.9). Electronically Signed   By: Lowella Grip III M.D.   On: 02/16/2018 13:18   Ct Cervical Spine Wo Contrast  Result Date: 02/16/2018 CLINICAL DATA:  Pain following fall EXAM: CT HEAD WITHOUT CONTRAST CT CERVICAL SPINE WITHOUT CONTRAST TECHNIQUE: Multidetector CT imaging of the head and cervical spine was performed following the standard protocol without intravenous contrast. Multiplanar CT image reconstructions of the cervical spine were also generated. COMPARISON:  Head CT June 08, 2009 FINDINGS: CT HEAD FINDINGS Brain: The ventricles are normal in size and configuration. There is no intracranial mass, hemorrhage, extra-axial fluid collection, or midline shift. Gray-white compartments appear normal. No evident acute infarct.  Vascular: There is no hyperdense vessel. There is calcification in each carotid siphon region. Skull: Bony calvarium appears intact. There is a right parietal scalp hematoma. Sinuses/Orbits: There is mucosal thickening in several ethmoid air cells. Other visualized paranasal sinuses are clear. Visualized orbits appear symmetric bilaterally. Note that there is increased attenuation in each globe which potentially may be artifactual. Other: Mastoid air cells are clear. CT CERVICAL SPINE FINDINGS Alignment: There is no appreciable spondylolisthesis. Skull base and vertebrae: Skull base and craniocervical junction regions appear normal. No evident fracture. No blastic or lytic bone lesions. Soft tissues and spinal canal: Prevertebral soft tissues and predental space regions are normal. No paraspinous lesions evident. No cord canal hematoma appreciable. Disc levels: There is moderate disc space narrowing at C5-6 and C6-7. There are anterior osteophytes at C5 and C6. There is a calcified central and left paracentral disc protrusion at C6-7 with mild impression on the ventral cord at this level. There is mild facet hypertrophy at several levels. Upper chest: Visualized upper lung zones are clear. There is a degree of underlying centrilobular emphysematous change. Other: There are foci of calcification in each carotid and subclavian artery. IMPRESSION: CT head: 1. No mass or hemorrhage. Gray-white compartments appear normal. Note that there is a right parietal scalp hematoma. No fracture evident. 2.  Foci of arterial vascular calcification noted. 3.  Mucosal thickening in several ethmoid air cells. 4. Increased attenuation in both globes may well be artifactual. If patient is reporting vision difficulty, ophthalmological examination in this regard may be reasonable. CT cervical spine: No fracture or spondylolisthesis. Arthropathy at several levels with a calcified central and left paracentral disc protrusion at C6-7. No  high-grade stenosis. Foci of calcification in subclavian and carotid arteries noted. Underlying centrilobular emphysematous change. Emphysema (ICD10-J43.9). Electronically Signed   By: Lowella Grip III M.D.   On: 02/16/2018 13:18   Dg Knee Complete 4 Views Left  Result Date: 01/20/2018 CLINICAL DATA:  Nonhealing wound at the left leg stump. EXAM: LEFT KNEE - COMPLETE 4+ VIEW COMPARISON:  12/17/2017 FINDINGS: Patient has a below the knee amputation. Stable appearance of the remaining tibia and fibula. There is no evidence for cortical irregularity or  periosteal reaction at the amputation site. There is lucency and irregularity in the soft tissues at the stump. Left knee is located. IMPRESSION: No acute bone abnormality involving the amputation site. No radiographic findings for osteomyelitis. Irregularity involving the soft tissues compatible with the clinical history. Electronically Signed   By: Markus Daft M.D.   On: 01/20/2018 09:54   Dg Knee Complete 4 Views Right  Result Date: 01/20/2018 CLINICAL DATA:  Nonhealing wound at left stump EXAM: RIGHT KNEE - COMPLETE 4+ VIEW COMPARISON:  12/17/2017 FINDINGS: Prior below the knee amputation on the right. No bony changes to suggest osteomyelitis. No soft tissue gas. No acute bony abnormality. IMPRESSION: Right BKA.  No radiographic changes of osteomyelitis. Electronically Signed   By: Rolm Baptise M.D.   On: 01/20/2018 09:50     LOS: 16 days   Oren Binet, MD  [facility] Pager:[phone]  If 7PM-7AM, please contact night-coverage [email or web address] Password [email or web address] 02/18/2018, 12:01 PM

## 2018-02-18 NOTE — Progress Notes (Signed)
Physical Therapy Treatment Patient Details Name: [name] MRN: [id number] DOB: [birth year] Today's Date: 02/18/2018    History of Present Illness 57 y.o. female admitted on 02/02/18 for bilateral leg pain and trouble breathing.  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  Pt with significant PMH of vasculitis, anemia, migraines, inflammatory arthritis, cocaine abuse, multiple bil amputations of LEs.      PT Comments    Pt performed transfer to seated position and able to back posterior out of bed in WC.  Anti-tippers placed down for safety and informed nursing they are in place.  Pt tolerated session well.  She became tearful during WC mobility around unit based on her limitations after surgery.  Informed nursing that she is tearful.  Pt resting in halls looking out window.  Informed nursing patient should be safe mobilizing in [facility] with anti-tippers in place.    Follow Up Recommendations  SNF     Equipment Recommendations  None recommended by PT    Recommendations for Other Services       Precautions / Restrictions Precautions Precautions: Fall Precaution Comments: wound vac is d/c at this time Restrictions Weight Bearing Restrictions: Yes RLE Weight Bearing: Non weight bearing LLE Weight Bearing: Non weight bearing    Mobility  Bed Mobility Overal bed mobility: Modified Independent Bed Mobility: Supine to Sit;Sit to Supine     Supine to sit: HOB elevated;Supervision     General bed mobility comments: Able to pull self up to reposition in bed. use of lower rail to move into long sitting and use of rails to turn and position for AP, transfer.  Supervision provided for safety.    Transfers Overall transfer level: Needs assistance Equipment used: None Transfers: Comptroller transfers: Supervision   General transfer  comment: Cues for hand placement and supervision for safety.  Placed anti-tippers down for OOB in [place] due to previous fall documented when tippers were up.  Ambulation/Gait Ambulation/Gait assistance: (unable.  )               Stairs             Wheelchair Mobility    Modified Rankin (Stroke Patients Only)       Balance Overall balance assessment: Needs assistance Sitting-balance support: Feet unsupported;Bilateral upper extremity supported Sitting balance-Leahy Scale: Fair Sitting balance - Comments: bil UE prop in static sitting close supervision, able to maintain static sitting without UE support briefly with minguard for safety                                      Cognition Arousal/Alertness: Awake/alert Behavior During Therapy: WFL for tasks assessed/performed Overall Cognitive Status: Within Functional Limits for tasks assessed                                        Exercises      General Comments        Pertinent Vitals/Pain Pain Assessment: Faces Pain Score: 6  Pain Location: bil residual limbs Pain Descriptors / Indicators: Discomfort Pain Intervention(s): Monitored during session;Repositioned;Ice applied    Home Living  Prior Function            PT Goals (current goals can now be found in the care plan section) Acute Rehab PT Goals Patient Stated Goal: To get some pain medicine.   PT Goal Formulation: With patient Potential to Achieve Goals: Good Progress towards PT goals: Progressing toward goals    Frequency    Min 2X/week      PT Plan Current plan remains appropriate    Co-evaluation              AM-PAC PT "6 Clicks" Daily Activity  Outcome Measure  Difficulty turning over in bed (including adjusting bedclothes, sheets and blankets)?: A Little Difficulty moving from lying on back to sitting on the side of the bed? : A Little Difficulty sitting down on and  standing up from a chair with arms (e.g., wheelchair, bedside commode, etc,.)?: Unable Help needed moving to and from a bed to chair (including a wheelchair)?: A Little Help needed walking in hospital room?: Total Help needed climbing 3-5 steps with a railing? : Total 6 Click Score: 12    End of Session Equipment Utilized During Treatment: Gait belt Activity Tolerance: Patient limited by pain Patient left: in chair;with call bell/phone within reach Nurse Communication: Mobility status;Other (comment);Patient requests pain meds(informed nursing that patient is tearful and requesting pain meds.  ) PT Visit Diagnosis: Muscle weakness (generalized) (M62.81);Pain Pain - Right/Left: (Bilateral) Pain - part of body: Leg(legs)     Time: 1601-0932 PT Time Calculation (min) (ACUTE ONLY): 26 min  Charges:  $Therapeutic Activity: 23-37 mins                    G Codes:       Governor Rooks, PTA pager [phone]    [name] 02/18/2018, 11:50 AM

## 2018-02-19 LAB — CREATININE, SERUM
Creatinine, Ser: 0.7 mg/dL (ref 0.44–1.00)
GFR calc non Af Amer: >60 mL/min (ref >60)

## 2018-02-19 NOTE — Plan of Care (Signed)
  Problem: Coping: Goal: Level of anxiety will decrease Outcome: Progressing Note:  Meds taken as ordered- pt. calm during shift.

## 2018-02-19 NOTE — Progress Notes (Signed)
PROGRESS NOTE        PATIENT DETAILS Name: [name] Age: 57 y.o. Sex: female Date of Birth: April 24, 1961 Admit Date: 02/02/2018 Admitting Physician Annita Brod, MD PCP:No primary care provider on file.  Brief Narrative: Patient is a 57 y.o. female prior history of cocaine use, hypertension, bilateral BKA-admitted with shortness of breath, found to have pneumonia, she was also found to have bilateral BKA stump infection-she was evaluated by orthopedics and underwent bilateral AKA.  Awaiting SNF placement.  Subjective: Pain stable at the bilateral stump site.Lying comfortably in bed-denies any chest pain or SOB has been stable for the past few days-awaiting SNF placement  Assessment/Plan: Sepsis secondary to healthcare associated pneumonia and bilateral BKA stump infection with gangrene: Sepsis pathophysiology has resolved-underwent bilateral AKA-no longer an any antimicrobial therapy-levofloxacin was discontinued on 5/12.  Blood cultures negative.  Stable to be transferred to SNF when a bed is available-given prior history of drug use-difficult to place to SNF.    Bilateral BKA stump infection with gangrene: Status post bilateral AKA on 5/5-spoke with Dr. Sharol Given on 5/14-okay to remove a wound VAC and place a 4 x 4 dressing with Ace wraps.  Appears to have some phantom pain-with good control with Neurontin. Stump site looks benign.  Mechanical fall: Patient fell from a wheelchair yesterday and landed on her head-CT C-spine/CT head negative for acute abnormalities.  She has a mild scalp hematoma in the occipital area but otherwise appears stable.  Anemia: Multifactorial-probably secondary to acute illness-hemoglobin remained stable, has been transfused a total of 3 units of PRBC so far.  Hypertension: Controlled with Norvasc  Polysubstance abuse/cocaine use: Counseled  Severe protein calorie malnutrition: Continue supplements  DVT Prophylaxis: Prophylactic  Lovenox   Code Status: Full code   Family Communication: None at bedside  Disposition Plan: Remain inpatient-family refused CIR-awaiting SNF placement.  Antimicrobial agents: Anti-infectives (From admission, onward)   Start     Dose/Rate Route Frequency Ordered Stop   02/09/18 1000  levofloxacin (LEVAQUIN) tablet 500 mg    Note to Pharmacy:  Dr switching pt over to Levaquin 500 mg oral per note.   500 mg Oral Daily 02/08/18 1349 02/13/18 1033   02/08/18 1145  levofloxacin (LEVAQUIN) IVPB 500 mg  Status:  Discontinued     500 mg 100 mL/hr over 60 Minutes Intravenous Every 24 hours 02/08/18 1142 02/08/18 1351   02/08/18 0900  vancomycin (VANCOCIN) IVPB 1000 mg/200 mL premix  Status:  Discontinued     1,000 mg 200 mL/hr over 60 Minutes Intravenous Every 24 hours 02/07/18 1107 02/08/18 1142   02/07/18 0600  ceFEPIme (MAXIPIME) 1 g in sodium chloride 0.9 % 100 mL IVPB  Status:  Discontinued     1 g 200 mL/hr over 30 Minutes Intravenous Every 24 hours 02/06/18 1442 02/08/18 1142   02/06/18 0630  ceFAZolin (ANCEF) IVPB 2g/100 mL premix  Status:  Discontinued     2 g 200 mL/hr over 30 Minutes Intravenous To [facility] 02/05/18 1035 02/06/18 0925   02/05/18 1015  ceFAZolin (ANCEF) IVPB 2g/100 mL premix     2 g 200 mL/hr over 30 Minutes Intravenous On call to O.R. 02/05/18 1011 02/06/18 0559   02/03/18 1800  vancomycin (VANCOCIN) 500 mg in sodium chloride 0.9 % 100 mL IVPB  Status:  Discontinued     500 mg 100  mL/hr over 60 Minutes Intravenous Every 24 hours 02/02/18 1847 02/02/18 1858   02/03/18 1000  vancomycin (VANCOCIN) IVPB 750 mg/150 ml premix  Status:  Discontinued     750 mg 150 mL/hr over 60 Minutes Intravenous Every 24 hours 02/02/18 1858 02/07/18 1107   02/02/18 2200  ceFEPIme (MAXIPIME) 1 g in sodium chloride 0.9 % 100 mL IVPB  Status:  Discontinued     1 g 200 mL/hr over 30 Minutes Intravenous Every 12 hours 02/02/18 1847 02/02/18 1858   02/02/18 2200  ceFEPIme  (MAXIPIME) 1 g in sodium chloride 0.9 % 100 mL IVPB  Status:  Discontinued     1 g 200 mL/hr over 30 Minutes Intravenous Every 8 hours 02/02/18 1858 02/06/18 1442   02/02/18 1845  ceFEPIme (MAXIPIME) 2 g in sodium chloride 0.9 % 100 mL IVPB  Status:  Discontinued     2 g 200 mL/hr over 30 Minutes Intravenous  Once 02/02/18 1830 02/02/18 1847   02/02/18 1845  vancomycin (VANCOCIN) IVPB 1000 mg/200 mL premix  Status:  Discontinued     1,000 mg 200 mL/hr over 60 Minutes Intravenous  Once 02/02/18 1830 02/02/18 1847   02/02/18 1500  vancomycin (VANCOCIN) IVPB 1000 mg/200 mL premix     1,000 mg 200 mL/hr over 60 Minutes Intravenous  Once 02/02/18 1445 02/02/18 1647   02/02/18 1500  piperacillin-tazobactam (ZOSYN) IVPB 3.375 g     3.375 g 100 mL/hr over 30 Minutes Intravenous  Once 02/02/18 1445 02/02/18 1546      Procedures: 5/5>> bilateral AKA  CONSULTS:  orthopedic surgery  Time spent: 25- minutes-Greater than 50% of this time was spent in counseling, explanation of diagnosis, planning of further management, and coordination of care.  MEDICATIONS: Scheduled Meds: . amLODipine  5 mg Oral Daily  . busPIRone  15 mg Oral TID  . docusate sodium  100 mg Oral BID  . enoxaparin (LOVENOX) injection  30 mg Subcutaneous Q24H  . escitalopram  20 mg Oral QHS  . feeding supplement (ENSURE ENLIVE)  237 mL Oral BID BM  . gabapentin  200 mg Oral BID  . multivitamin with minerals  1 tablet Oral Daily   Continuous Infusions: . methocarbamol (ROBAXIN)  IV     PRN Meds:.acetaminophen, alum & mag hydroxide-simeth, bisacodyl, diphenhydrAMINE, HYDROmorphone (DILAUDID) injection, ibuprofen, ipratropium-albuterol, magnesium citrate, methocarbamol **OR** methocarbamol (ROBAXIN)  IV, metoCLOPramide **OR** metoCLOPramide (REGLAN) injection, morphine injection, ondansetron **OR** ondansetron (ZOFRAN) IV, oxyCODONE, oxyCODONE, polyethylene glycol, zolpidem   PHYSICAL EXAM: Vital signs: Vitals:    02/18/18 1500 02/18/18 1543 02/18/18 1936 02/19/18 0631  BP: 103/63 103/63 97/67 122/65  Pulse: 73 73 75 72  Resp: 17 18    Temp: 97.7 F (36.5 C) 98.4 F (36.9 C) 98.4 F (36.9 C) 98.6 F (37 C)  TempSrc: Oral Oral Oral Oral  SpO2: 98% 99% 100% 98%  Weight:      Height:       Filed Weights   02/02/18 1636  Weight: 36.2 kg (79 lb 12.9 oz)   Body mass index is 15.08 kg/m.   General appearance :Awake, alert, not in any distress.  Eyes:, pupils equally reactive to light and accomodation,no scleral icterus. HEENT: Atraumatic and Normocephalic Neck: supple, no JVD. Resp:Good air entry bilaterally, no rales or rhonchi CVS: S1 S2 regular, no murmurs.  GI: Bowel sounds present, Non tender and not distended with no gaurding, rigidity or rebound. Extremities: B/L AKA-staples in place-no active discharge.  Neurology:  speech clear,Non focal, sensation  is grossly intact. Psychiatric: Normal judgment and insight. Normal mood. Musculoskeletal:No digital cyanosis Skin:No Rash, warm and dry Wounds:N/A  I have personally reviewed following labs and imaging studies  LABORATORY DATA: CBC: Recent Labs  Lab 02/16/18 0447  WBC 6.7  HGB 9.4*  HCT 29.7*  MCV 93.4  PLT 151    Basic Metabolic Panel: Recent Labs  Lab 02/13/18 0431 02/15/18 0450 02/16/18 0447 02/17/18 0434 02/19/18 0732  NA  --   --  138  --   --   K  --   --  4.7  --   --   CL  --   --  106  --   --   CO2  --   --  23  --   --   GLUCOSE  --   --  91  --   --   BUN  --   --  17  --   --   CREATININE 0.83 0.93 0.74 0.75 0.70  CALCIUM  --   --  8.9  --   --     GFR: Estimated Creatinine Clearance: 45.9 mL/min (by C-G formula based on SCr of 0.7 mg/dL).  Liver Function Tests: No results for input(s): AST, ALT, ALKPHOS, BILITOT, PROT, ALBUMIN in the last 168 hours. No results for input(s): LIPASE, AMYLASE in the last 168 hours. No results for input(s): AMMONIA in the last 168 hours.  Coagulation  Profile: No results for input(s): INR, PROTIME in the last 168 hours.  Cardiac Enzymes: No results for input(s): CKTOTAL, CKMB, CKMBINDEX, TROPONINI in the last 168 hours.  BNP (last 3 results) No results for input(s): PROBNP in the last 8760 hours.  HbA1C: No results for input(s): HGBA1C in the last 72 hours.  CBG: No results for input(s): GLUCAP in the last 168 hours.  Lipid Profile: No results for input(s): CHOL, HDL, LDLCALC, TRIG, CHOLHDL, LDLDIRECT in the last 72 hours.  Thyroid Function Tests: No results for input(s): TSH, T4TOTAL, FREET4, T3FREE, THYROIDAB in the last 72 hours.  Anemia Panel: No results for input(s): VITAMINB12, FOLATE, FERRITIN, TIBC, IRON, RETICCTPCT in the last 72 hours.  Urine analysis:    Component Value Date/Time   COLORURINE AMBER (A) 01/20/2018 1020   APPEARANCEUR HAZY (A) 01/20/2018 1020   LABSPEC 1.026 01/20/2018 1020   PHURINE 5.0 01/20/2018 1020   GLUCOSEU NEGATIVE 01/20/2018 1020   HGBUR LARGE (A) 01/20/2018 1020   BILIRUBINUR NEGATIVE 01/20/2018 1020   BILIRUBINUR small 04/29/2015 1533   KETONESUR NEGATIVE 01/20/2018 1020   PROTEINUR >=300 (A) 01/20/2018 1020   UROBILINOGEN 1.0 07/26/2017 1550   NITRITE NEGATIVE 01/20/2018 1020   LEUKOCYTESUR NEGATIVE 01/20/2018 1020    Sepsis Labs: Lactic Acid, Venous    Component Value Date/Time   LATICACIDVEN 1.1 02/03/2018 0013    MICROBIOLOGY: No results found for this or any previous visit (from the past 240 hour(s)).  RADIOLOGY STUDIES/RESULTS: Dg Chest 2 View  Addendum Date: 02/02/2018   ADDENDUM REPORT: 02/02/2018 14:33 ADDENDUM: Acute findings discussed with and reconfirmed by Dr.KEVIN CAMPOS on 02/02/2018 at 2:32 pm. Electronically Signed   By: Elon Alas M.D.   On: 02/02/2018 14:33   Result Date: 02/02/2018 CLINICAL DATA:  Mid chest pain, cough and congestion. EXAM: CHEST - 2 VIEW COMPARISON:  Chest radiograph January 20, 2018 FINDINGS: Multifocal RIGHT lung consolidation.  Mild chronic interstitial changes without pleural effusion. Borderline cardiomegaly. Calcified aortic knob. No pneumothorax. Soft tissue planes included osseous structures are nonsuspicious. IMPRESSION: 1. Multifocal  RIGHT pneumonia. 2. Borderline cardiomegaly. 3.  Aortic Atherosclerosis (ICD10-I70.0). Electronically Signed: By: Elon Alas M.D. On: 02/02/2018 14:28   Dg Knee 1-2 Views Left  Result Date: 02/02/2018 CLINICAL DATA:  Knee pain.  Status post BKA. EXAM: LEFT KNEE - 1-2 VIEW COMPARISON:  01/20/2018 FINDINGS: Bones are demineralized. No evidence for an acute fracture. No subluxation or dislocation. No worrisome lytic or sclerotic osseous abnormality. No gross joint effusion evident. There is some soft tissue irregularity at the stump. IMPRESSION: Osteopenia without acute bony abnormality. Soft tissue irregularity at the stump may be postoperative scarring although wound/ulcer could have this appearance. Electronically Signed   By: Misty Stanley M.D.   On: 02/02/2018 14:30   Dg Knee 1-2 Views Right  Result Date: 02/02/2018 CLINICAL DATA:  Pain and necrosis at stump, bleeding. EXAM: RIGHT KNEE - 1-2 VIEW COMPARISON:  RIGHT knee radiograph January 20, 2018 FINDINGS: Status post below-knee amputation. No acute osseous process. No rib fracture deformity or dislocation. No advanced degenerative change for age. Increasing soft tissue swelling with superficial irregularity and subcutaneous gas about the stump. IMPRESSION: Soft tissue swelling with new subcutaneous gas seen with necrotizing fasciitis or recent instrumentation. No acute osseous process. Acute findings discussed with and reconfirmed by Dr.KEVIN CAMPOS on 02/02/2018 at 2:32 pm. Electronically Signed   By: Elon Alas M.D.   On: 02/02/2018 14:32   Ct Head Wo Contrast  Result Date: 02/16/2018 CLINICAL DATA:  Pain following fall EXAM: CT HEAD WITHOUT CONTRAST CT CERVICAL SPINE WITHOUT CONTRAST TECHNIQUE: Multidetector CT imaging of the  head and cervical spine was performed following the standard protocol without intravenous contrast. Multiplanar CT image reconstructions of the cervical spine were also generated. COMPARISON:  Head CT June 08, 2009 FINDINGS: CT HEAD FINDINGS Brain: The ventricles are normal in size and configuration. There is no intracranial mass, hemorrhage, extra-axial fluid collection, or midline shift. Gray-white compartments appear normal. No evident acute infarct. Vascular: There is no hyperdense vessel. There is calcification in each carotid siphon region. Skull: Bony calvarium appears intact. There is a right parietal scalp hematoma. Sinuses/Orbits: There is mucosal thickening in several ethmoid air cells. Other visualized paranasal sinuses are clear. Visualized orbits appear symmetric bilaterally. Note that there is increased attenuation in each globe which potentially may be artifactual. Other: Mastoid air cells are clear. CT CERVICAL SPINE FINDINGS Alignment: There is no appreciable spondylolisthesis. Skull base and vertebrae: Skull base and craniocervical junction regions appear normal. No evident fracture. No blastic or lytic bone lesions. Soft tissues and spinal canal: Prevertebral soft tissues and predental space regions are normal. No paraspinous lesions evident. No cord canal hematoma appreciable. Disc levels: There is moderate disc space narrowing at C5-6 and C6-7. There are anterior osteophytes at C5 and C6. There is a calcified central and left paracentral disc protrusion at C6-7 with mild impression on the ventral cord at this level. There is mild facet hypertrophy at several levels. Upper chest: Visualized upper lung zones are clear. There is a degree of underlying centrilobular emphysematous change. Other: There are foci of calcification in each carotid and subclavian artery. IMPRESSION: CT head: 1. No mass or hemorrhage. Gray-white compartments appear normal. Note that there is a right parietal scalp  hematoma. No fracture evident. 2.  Foci of arterial vascular calcification noted. 3.  Mucosal thickening in several ethmoid air cells. 4. Increased attenuation in both globes may well be artifactual. If patient is reporting vision difficulty, ophthalmological examination in this regard may be reasonable. CT cervical  spine: No fracture or spondylolisthesis. Arthropathy at several levels with a calcified central and left paracentral disc protrusion at C6-7. No high-grade stenosis. Foci of calcification in subclavian and carotid arteries noted. Underlying centrilobular emphysematous change. Emphysema (ICD10-J43.9). Electronically Signed   By: Lowella Grip III M.D.   On: 02/16/2018 13:18   Ct Cervical Spine Wo Contrast  Result Date: 02/16/2018 CLINICAL DATA:  Pain following fall EXAM: CT HEAD WITHOUT CONTRAST CT CERVICAL SPINE WITHOUT CONTRAST TECHNIQUE: Multidetector CT imaging of the head and cervical spine was performed following the standard protocol without intravenous contrast. Multiplanar CT image reconstructions of the cervical spine were also generated. COMPARISON:  Head CT June 08, 2009 FINDINGS: CT HEAD FINDINGS Brain: The ventricles are normal in size and configuration. There is no intracranial mass, hemorrhage, extra-axial fluid collection, or midline shift. Gray-white compartments appear normal. No evident acute infarct. Vascular: There is no hyperdense vessel. There is calcification in each carotid siphon region. Skull: Bony calvarium appears intact. There is a right parietal scalp hematoma. Sinuses/Orbits: There is mucosal thickening in several ethmoid air cells. Other visualized paranasal sinuses are clear. Visualized orbits appear symmetric bilaterally. Note that there is increased attenuation in each globe which potentially may be artifactual. Other: Mastoid air cells are clear. CT CERVICAL SPINE FINDINGS Alignment: There is no appreciable spondylolisthesis. Skull base and vertebrae:  Skull base and craniocervical junction regions appear normal. No evident fracture. No blastic or lytic bone lesions. Soft tissues and spinal canal: Prevertebral soft tissues and predental space regions are normal. No paraspinous lesions evident. No cord canal hematoma appreciable. Disc levels: There is moderate disc space narrowing at C5-6 and C6-7. There are anterior osteophytes at C5 and C6. There is a calcified central and left paracentral disc protrusion at C6-7 with mild impression on the ventral cord at this level. There is mild facet hypertrophy at several levels. Upper chest: Visualized upper lung zones are clear. There is a degree of underlying centrilobular emphysematous change. Other: There are foci of calcification in each carotid and subclavian artery. IMPRESSION: CT head: 1. No mass or hemorrhage. Gray-white compartments appear normal. Note that there is a right parietal scalp hematoma. No fracture evident. 2.  Foci of arterial vascular calcification noted. 3.  Mucosal thickening in several ethmoid air cells. 4. Increased attenuation in both globes may well be artifactual. If patient is reporting vision difficulty, ophthalmological examination in this regard may be reasonable. CT cervical spine: No fracture or spondylolisthesis. Arthropathy at several levels with a calcified central and left paracentral disc protrusion at C6-7. No high-grade stenosis. Foci of calcification in subclavian and carotid arteries noted. Underlying centrilobular emphysematous change. Emphysema (ICD10-J43.9). Electronically Signed   By: Lowella Grip III M.D.   On: 02/16/2018 13:18     LOS: 17 days   Oren Binet, MD  [facility] Pager:[phone]  If 7PM-7AM, please contact night-coverage [email or web address] Password [email or web address] 02/19/2018, 12:10 PM

## 2018-02-20 NOTE — Progress Notes (Signed)
PROGRESS NOTE        PATIENT DETAILS Name: [name] Age: 57 y.o. Sex: female Date of Birth: May 06, 1961 Admit Date: 02/02/2018 Admitting Physician Annita Brod, MD PCP:No primary care provider on file.  Brief Narrative: Patient is a 57 y.o. female prior history of cocaine use, hypertension, bilateral BKA-admitted with shortness of breath, found to have pneumonia, she was also found to have bilateral BKA stump infection-she was evaluated by orthopedics and underwent bilateral AKA.  Awaiting SNF placement.  Subjective: No chest pain or shortness of breath.  Pain at the stump site appears to be stable.  Assessment/Plan: Sepsis secondary to healthcare associated pneumonia and bilateral BKA stump infection with gangrene: Sepsis pathophysiology has resolved, underwent bilateral AKA-no longer on any antimicrobial therapy-Levaquin is discontinued on 5/12.  Blood cultures negative.  Awaiting SNF bed-per social work difficult to place given prior history of drug use.    Bilateral BKA stump infection with gangrene: Status post bilateral AKA on 5/5-spoke with Dr. Sharol Given on 5/14-okay to remove a wound VAC and place a 4 x 4 dressing with Ace wraps.  Appears to have some phantom pain-with good control with Neurontin. Stump site looks benign.  Mechanical fall: Patient fell from a wheelchair on 5/15, CT of the head and CT of the C-spine negative for abnormalities.  Small scalp hematoma in the occipital area appears to be stable.    Anemia: Multifactorial-probably secondary to acute illness-hemoglobin remained stable, has been transfused a total of 3 units of PRBC so far.  Hypertension: Controlled with Norvasc  Polysubstance abuse/cocaine use: Counseled  Severe protein calorie malnutrition: Continue supplements  DVT Prophylaxis: Prophylactic Lovenox   Code Status: Full code   Family Communication: None at bedside  Disposition Plan: Remain inpatient-family refused  CIR-awaiting SNF placement.  Antimicrobial agents: Anti-infectives (From admission, onward)   Start     Dose/Rate Route Frequency Ordered Stop   02/09/18 1000  levofloxacin (LEVAQUIN) tablet 500 mg    Note to Pharmacy:  Dr switching pt over to Levaquin 500 mg oral per note.   500 mg Oral Daily 02/08/18 1349 02/13/18 1033   02/08/18 1145  levofloxacin (LEVAQUIN) IVPB 500 mg  Status:  Discontinued     500 mg 100 mL/hr over 60 Minutes Intravenous Every 24 hours 02/08/18 1142 02/08/18 1351   02/08/18 0900  vancomycin (VANCOCIN) IVPB 1000 mg/200 mL premix  Status:  Discontinued     1,000 mg 200 mL/hr over 60 Minutes Intravenous Every 24 hours 02/07/18 1107 02/08/18 1142   02/07/18 0600  ceFEPIme (MAXIPIME) 1 g in sodium chloride 0.9 % 100 mL IVPB  Status:  Discontinued     1 g 200 mL/hr over 30 Minutes Intravenous Every 24 hours 02/06/18 1442 02/08/18 1142   02/06/18 0630  ceFAZolin (ANCEF) IVPB 2g/100 mL premix  Status:  Discontinued     2 g 200 mL/hr over 30 Minutes Intravenous To [facility] 02/05/18 1035 02/06/18 0925   02/05/18 1015  ceFAZolin (ANCEF) IVPB 2g/100 mL premix     2 g 200 mL/hr over 30 Minutes Intravenous On call to O.R. 02/05/18 1011 02/06/18 0559   02/03/18 1800  vancomycin (VANCOCIN) 500 mg in sodium chloride 0.9 % 100 mL IVPB  Status:  Discontinued     500 mg 100 mL/hr over 60 Minutes Intravenous Every 24 hours 02/02/18 1847 02/02/18 1858  02/03/18 1000  vancomycin (VANCOCIN) IVPB 750 mg/150 ml premix  Status:  Discontinued     750 mg 150 mL/hr over 60 Minutes Intravenous Every 24 hours 02/02/18 1858 02/07/18 1107   02/02/18 2200  ceFEPIme (MAXIPIME) 1 g in sodium chloride 0.9 % 100 mL IVPB  Status:  Discontinued     1 g 200 mL/hr over 30 Minutes Intravenous Every 12 hours 02/02/18 1847 02/02/18 1858   02/02/18 2200  ceFEPIme (MAXIPIME) 1 g in sodium chloride 0.9 % 100 mL IVPB  Status:  Discontinued     1 g 200 mL/hr over 30 Minutes Intravenous Every 8 hours  02/02/18 1858 02/06/18 1442   02/02/18 1845  ceFEPIme (MAXIPIME) 2 g in sodium chloride 0.9 % 100 mL IVPB  Status:  Discontinued     2 g 200 mL/hr over 30 Minutes Intravenous  Once 02/02/18 1830 02/02/18 1847   02/02/18 1845  vancomycin (VANCOCIN) IVPB 1000 mg/200 mL premix  Status:  Discontinued     1,000 mg 200 mL/hr over 60 Minutes Intravenous  Once 02/02/18 1830 02/02/18 1847   02/02/18 1500  vancomycin (VANCOCIN) IVPB 1000 mg/200 mL premix     1,000 mg 200 mL/hr over 60 Minutes Intravenous  Once 02/02/18 1445 02/02/18 1647   02/02/18 1500  piperacillin-tazobactam (ZOSYN) IVPB 3.375 g     3.375 g 100 mL/hr over 30 Minutes Intravenous  Once 02/02/18 1445 02/02/18 1546      Procedures: 5/5>> bilateral AKA  CONSULTS:  orthopedic surgery  Time spent: 25- minutes-Greater than 50% of this time was spent in counseling, explanation of diagnosis, planning of further management, and coordination of care.  MEDICATIONS: Scheduled Meds: . amLODipine  5 mg Oral Daily  . busPIRone  15 mg Oral TID  . docusate sodium  100 mg Oral BID  . enoxaparin (LOVENOX) injection  30 mg Subcutaneous Q24H  . escitalopram  20 mg Oral QHS  . feeding supplement (ENSURE ENLIVE)  237 mL Oral BID BM  . gabapentin  200 mg Oral BID  . multivitamin with minerals  1 tablet Oral Daily   Continuous Infusions: . methocarbamol (ROBAXIN)  IV     PRN Meds:.acetaminophen, alum & mag hydroxide-simeth, bisacodyl, diphenhydrAMINE, HYDROmorphone (DILAUDID) injection, ibuprofen, ipratropium-albuterol, magnesium citrate, methocarbamol **OR** methocarbamol (ROBAXIN)  IV, metoCLOPramide **OR** metoCLOPramide (REGLAN) injection, morphine injection, ondansetron **OR** ondansetron (ZOFRAN) IV, oxyCODONE, oxyCODONE, polyethylene glycol, zolpidem   PHYSICAL EXAM: Vital signs: Vitals:   02/19/18 0631 02/19/18 1342 02/19/18 1929 02/20/18 0551  BP: 122/65 101/66 (!) 102/54 105/68  Pulse: 72 75 64 75  Resp:  16    Temp: 98.6 F  (37 C) 98.2 F (36.8 C) 98.1 F (36.7 C) 98.2 F (36.8 C)  TempSrc: Oral Oral Oral Oral  SpO2: 98% 97% 97% 97%  Weight:      Height:       Filed Weights   02/02/18 1636  Weight: 36.2 kg (79 lb 12.9 oz)   Body mass index is 15.08 kg/m.   General appearance:Awake, alert, not in any distress.  Eyes:no scleral icterus. HEENT: Atraumatic and Normocephalic Neck: supple, no JVD. Resp:Good air entry bilaterally,no rales or rhonchi CVS: S1 S2 regular, no murmurs.  GI: Bowel sounds present, Non tender and not distended with no gaurding, rigidity or rebound. Extremities: B/L AKA-staples in place-no erythema or swelling or discharge evident Neurology:  Non focal Psychiatric: Normal judgment and insight. Normal mood. Musculoskeletal:No digital cyanosis Skin:No Rash, warm and dry Wounds:N/A  I have personally reviewed  following labs and imaging studies  LABORATORY DATA: CBC: Recent Labs  Lab 02/16/18 0447  WBC 6.7  HGB 9.4*  HCT 29.7*  MCV 93.4  PLT 267    Basic Metabolic Panel: Recent Labs  Lab 02/15/18 0450 02/16/18 0447 02/17/18 0434 02/19/18 0732  NA  --  138  --   --   K  --  4.7  --   --   CL  --  106  --   --   CO2  --  23  --   --   GLUCOSE  --  91  --   --   BUN  --  17  --   --   CREATININE 0.93 0.74 0.75 0.70  CALCIUM  --  8.9  --   --     GFR: Estimated Creatinine Clearance: 45.9 mL/min (by C-G formula based on SCr of 0.7 mg/dL).  Liver Function Tests: No results for input(s): AST, ALT, ALKPHOS, BILITOT, PROT, ALBUMIN in the last 168 hours. No results for input(s): LIPASE, AMYLASE in the last 168 hours. No results for input(s): AMMONIA in the last 168 hours.  Coagulation Profile: No results for input(s): INR, PROTIME in the last 168 hours.  Cardiac Enzymes: No results for input(s): CKTOTAL, CKMB, CKMBINDEX, TROPONINI in the last 168 hours.  BNP (last 3 results) No results for input(s): PROBNP in the last 8760 hours.  HbA1C: No results for  input(s): HGBA1C in the last 72 hours.  CBG: No results for input(s): GLUCAP in the last 168 hours.  Lipid Profile: No results for input(s): CHOL, HDL, LDLCALC, TRIG, CHOLHDL, LDLDIRECT in the last 72 hours.  Thyroid Function Tests: No results for input(s): TSH, T4TOTAL, FREET4, T3FREE, THYROIDAB in the last 72 hours.  Anemia Panel: No results for input(s): VITAMINB12, FOLATE, FERRITIN, TIBC, IRON, RETICCTPCT in the last 72 hours.  Urine analysis:    Component Value Date/Time   COLORURINE AMBER (A) 01/20/2018 1020   APPEARANCEUR HAZY (A) 01/20/2018 1020   LABSPEC 1.026 01/20/2018 1020   PHURINE 5.0 01/20/2018 1020   GLUCOSEU NEGATIVE 01/20/2018 1020   HGBUR LARGE (A) 01/20/2018 1020   BILIRUBINUR NEGATIVE 01/20/2018 1020   BILIRUBINUR small 04/29/2015 1533   KETONESUR NEGATIVE 01/20/2018 1020   PROTEINUR >=300 (A) 01/20/2018 1020   UROBILINOGEN 1.0 07/26/2017 1550   NITRITE NEGATIVE 01/20/2018 1020   LEUKOCYTESUR NEGATIVE 01/20/2018 1020    Sepsis Labs: Lactic Acid, Venous    Component Value Date/Time   LATICACIDVEN 1.1 02/03/2018 0013    MICROBIOLOGY: No results found for this or any previous visit (from the past 240 hour(s)).  RADIOLOGY STUDIES/RESULTS: Dg Chest 2 View  Addendum Date: 02/02/2018   ADDENDUM REPORT: 02/02/2018 14:33 ADDENDUM: Acute findings discussed with and reconfirmed by Dr.KEVIN CAMPOS on 02/02/2018 at 2:32 pm. Electronically Signed   By: Elon Alas M.D.   On: 02/02/2018 14:33   Result Date: 02/02/2018 CLINICAL DATA:  Mid chest pain, cough and congestion. EXAM: CHEST - 2 VIEW COMPARISON:  Chest radiograph January 20, 2018 FINDINGS: Multifocal RIGHT lung consolidation. Mild chronic interstitial changes without pleural effusion. Borderline cardiomegaly. Calcified aortic knob. No pneumothorax. Soft tissue planes included osseous structures are nonsuspicious. IMPRESSION: 1. Multifocal RIGHT pneumonia. 2. Borderline cardiomegaly. 3.  Aortic  Atherosclerosis (ICD10-I70.0). Electronically Signed: By: Elon Alas M.D. On: 02/02/2018 14:28   Dg Knee 1-2 Views Left  Result Date: 02/02/2018 CLINICAL DATA:  Knee pain.  Status post BKA. EXAM: LEFT KNEE - 1-2 VIEW COMPARISON:  01/20/2018 FINDINGS: Bones are demineralized. No evidence for an acute fracture. No subluxation or dislocation. No worrisome lytic or sclerotic osseous abnormality. No gross joint effusion evident. There is some soft tissue irregularity at the stump. IMPRESSION: Osteopenia without acute bony abnormality. Soft tissue irregularity at the stump may be postoperative scarring although wound/ulcer could have this appearance. Electronically Signed   By: Misty Stanley M.D.   On: 02/02/2018 14:30   Dg Knee 1-2 Views Right  Result Date: 02/02/2018 CLINICAL DATA:  Pain and necrosis at stump, bleeding. EXAM: RIGHT KNEE - 1-2 VIEW COMPARISON:  RIGHT knee radiograph January 20, 2018 FINDINGS: Status post below-knee amputation. No acute osseous process. No rib fracture deformity or dislocation. No advanced degenerative change for age. Increasing soft tissue swelling with superficial irregularity and subcutaneous gas about the stump. IMPRESSION: Soft tissue swelling with new subcutaneous gas seen with necrotizing fasciitis or recent instrumentation. No acute osseous process. Acute findings discussed with and reconfirmed by Dr.KEVIN CAMPOS on 02/02/2018 at 2:32 pm. Electronically Signed   By: Elon Alas M.D.   On: 02/02/2018 14:32   Ct Head Wo Contrast  Result Date: 02/16/2018 CLINICAL DATA:  Pain following fall EXAM: CT HEAD WITHOUT CONTRAST CT CERVICAL SPINE WITHOUT CONTRAST TECHNIQUE: Multidetector CT imaging of the head and cervical spine was performed following the standard protocol without intravenous contrast. Multiplanar CT image reconstructions of the cervical spine were also generated. COMPARISON:  Head CT June 08, 2009 FINDINGS: CT HEAD FINDINGS Brain: The ventricles are  normal in size and configuration. There is no intracranial mass, hemorrhage, extra-axial fluid collection, or midline shift. Gray-white compartments appear normal. No evident acute infarct. Vascular: There is no hyperdense vessel. There is calcification in each carotid siphon region. Skull: Bony calvarium appears intact. There is a right parietal scalp hematoma. Sinuses/Orbits: There is mucosal thickening in several ethmoid air cells. Other visualized paranasal sinuses are clear. Visualized orbits appear symmetric bilaterally. Note that there is increased attenuation in each globe which potentially may be artifactual. Other: Mastoid air cells are clear. CT CERVICAL SPINE FINDINGS Alignment: There is no appreciable spondylolisthesis. Skull base and vertebrae: Skull base and craniocervical junction regions appear normal. No evident fracture. No blastic or lytic bone lesions. Soft tissues and spinal canal: Prevertebral soft tissues and predental space regions are normal. No paraspinous lesions evident. No cord canal hematoma appreciable. Disc levels: There is moderate disc space narrowing at C5-6 and C6-7. There are anterior osteophytes at C5 and C6. There is a calcified central and left paracentral disc protrusion at C6-7 with mild impression on the ventral cord at this level. There is mild facet hypertrophy at several levels. Upper chest: Visualized upper lung zones are clear. There is a degree of underlying centrilobular emphysematous change. Other: There are foci of calcification in each carotid and subclavian artery. IMPRESSION: CT head: 1. No mass or hemorrhage. Gray-white compartments appear normal. Note that there is a right parietal scalp hematoma. No fracture evident. 2.  Foci of arterial vascular calcification noted. 3.  Mucosal thickening in several ethmoid air cells. 4. Increased attenuation in both globes may well be artifactual. If patient is reporting vision difficulty, ophthalmological examination in  this regard may be reasonable. CT cervical spine: No fracture or spondylolisthesis. Arthropathy at several levels with a calcified central and left paracentral disc protrusion at C6-7. No high-grade stenosis. Foci of calcification in subclavian and carotid arteries noted. Underlying centrilobular emphysematous change. Emphysema (ICD10-J43.9). Electronically Signed   By: Lowella Grip III M.D.  On: 02/16/2018 13:18   Ct Cervical Spine Wo Contrast  Result Date: 02/16/2018 CLINICAL DATA:  Pain following fall EXAM: CT HEAD WITHOUT CONTRAST CT CERVICAL SPINE WITHOUT CONTRAST TECHNIQUE: Multidetector CT imaging of the head and cervical spine was performed following the standard protocol without intravenous contrast. Multiplanar CT image reconstructions of the cervical spine were also generated. COMPARISON:  Head CT June 08, 2009 FINDINGS: CT HEAD FINDINGS Brain: The ventricles are normal in size and configuration. There is no intracranial mass, hemorrhage, extra-axial fluid collection, or midline shift. Gray-white compartments appear normal. No evident acute infarct. Vascular: There is no hyperdense vessel. There is calcification in each carotid siphon region. Skull: Bony calvarium appears intact. There is a right parietal scalp hematoma. Sinuses/Orbits: There is mucosal thickening in several ethmoid air cells. Other visualized paranasal sinuses are clear. Visualized orbits appear symmetric bilaterally. Note that there is increased attenuation in each globe which potentially may be artifactual. Other: Mastoid air cells are clear. CT CERVICAL SPINE FINDINGS Alignment: There is no appreciable spondylolisthesis. Skull base and vertebrae: Skull base and craniocervical junction regions appear normal. No evident fracture. No blastic or lytic bone lesions. Soft tissues and spinal canal: Prevertebral soft tissues and predental space regions are normal. No paraspinous lesions evident. No cord canal hematoma  appreciable. Disc levels: There is moderate disc space narrowing at C5-6 and C6-7. There are anterior osteophytes at C5 and C6. There is a calcified central and left paracentral disc protrusion at C6-7 with mild impression on the ventral cord at this level. There is mild facet hypertrophy at several levels. Upper chest: Visualized upper lung zones are clear. There is a degree of underlying centrilobular emphysematous change. Other: There are foci of calcification in each carotid and subclavian artery. IMPRESSION: CT head: 1. No mass or hemorrhage. Gray-white compartments appear normal. Note that there is a right parietal scalp hematoma. No fracture evident. 2.  Foci of arterial vascular calcification noted. 3.  Mucosal thickening in several ethmoid air cells. 4. Increased attenuation in both globes may well be artifactual. If patient is reporting vision difficulty, ophthalmological examination in this regard may be reasonable. CT cervical spine: No fracture or spondylolisthesis. Arthropathy at several levels with a calcified central and left paracentral disc protrusion at C6-7. No high-grade stenosis. Foci of calcification in subclavian and carotid arteries noted. Underlying centrilobular emphysematous change. Emphysema (ICD10-J43.9). Electronically Signed   By: Lowella Grip III M.D.   On: 02/16/2018 13:18     LOS: 18 days   Oren Binet, MD  [facility] Pager:[phone]  If 7PM-7AM, please contact night-coverage [email or web address] Password [email or web address] 02/20/2018, 1:15 PM

## 2018-02-21 LAB — BASIC METABOLIC PANEL
ANION GAP: 9 (ref 5–15)
BUN: 15 mg/dL (ref 6–20)
CHLORIDE: 105 mmol/L (ref 101–111)
CO2: 26 mmol/L (ref 22–32)
Calcium: 9.2 mg/dL (ref 8.9–10.3)
Creatinine, Ser: 0.8 mg/dL (ref 0.44–1.00)
GFR calc Af Amer: >60 mL/min (ref >60)
GFR calc non Af Amer: >60 mL/min (ref >60)
GLUCOSE: 101 mg/dL — AB (ref 65–99)
POTASSIUM: 4.9 mmol/L (ref 3.5–5.1)
SODIUM: 140 mmol/L (ref 135–145)

## 2018-02-21 LAB — CBC
HCT: 31.4 % — ABNORMAL LOW (ref 36.0–46.0)
HEMOGLOBIN: 10.1 g/dL — AB (ref 12.0–15.0)
MCH: 29.8 pg (ref 26.0–34.0)
MCHC: 32.2 g/dL (ref 30.0–36.0)
MCV: 92.6 fL (ref 78.0–100.0)
PLATELETS: 316 10*3/uL (ref 150–400)
RBC: 3.39 MIL/uL — ABNORMAL LOW (ref 3.87–5.11)
RDW: 14.9 % (ref 11.5–15.5)
WBC: 6.1 10*3/uL (ref 4.0–10.5)

## 2018-02-21 MED ORDER — POLYETHYLENE GLYCOL 3350 17 G PO PACK
17.0000 g | PACK | Freq: Every day | ORAL | Status: DC
  Administered 2018-02-21 – 2018-02-24 (×4): 17 g via ORAL
  Filled 2018-02-21 (×8): qty 1

## 2018-02-21 NOTE — Progress Notes (Signed)
PROGRESS NOTE        PATIENT DETAILS Name: [name] Age: 57 y.o. Sex: female Date of Birth: [birth year] Admit Date: 02/02/2018 Admitting Physician Annita Brod, MD PCP:No primary care provider on file.  Brief Narrative: Patient is a 57 y.o. female prior history of cocaine use, hypertension, bilateral BKA-admitted with shortness of breath, found to have pneumonia, she was also found to have bilateral BKA stump infection-she was evaluated by orthopedics and underwent bilateral AKA.  Awaiting SNF placement.  Subjective: No major issues overnight-denies chest pain, shortness of breath.  Lying in bed comfortably.  Assessment/Plan: Sepsis secondary to healthcare associated pneumonia and bilateral BKA stump infection with gangrene: Sepsis pathophysiology has resolved, underwent bilateral AKA-no longer on any antimicrobial therapy-Levaquin is discontinued on 5/12.  Blood cultures negative.  Awaiting SNF bed-per social work difficulttoplace per social work) given prior history of drug use.    Bilateral BKA stump infection with gangrene: Status post bilateral AKA on 5/5-spoke with Dr. Sharol Given on 5/14-okay to remove a wound VAC and place a 4 x 4 dressing with Ace wraps.  Appears to have some phantom pain-with good control with Neurontin. Stump site looks benign.  Mechanical fall: Patient fell from a wheelchair on 5/15, CT of the head and CT of the C-spine negative for abnormalities.  Small scalp hematoma in the occipital area appears to be stable.    Anemia: Multifactorial-probably secondary to acute illness-hemoglobin remained stable, has been transfused a total of 3 units of PRBC so far.  Hypertension: Controlled with Norvasc  Polysubstance abuse/cocaine use: Counseled  Severe protein calorie malnutrition: Continue supplements  DVT Prophylaxis: Prophylactic Lovenox   Code Status: Full code   Family Communication: None at bedside  Disposition Plan: Remain  inpatient-family refused CIR-awaiting SNF placement.  Antimicrobial agents: Anti-infectives (From admission, onward)   Start     Dose/Rate Route Frequency Ordered Stop   02/09/18 1000  levofloxacin (LEVAQUIN) tablet 500 mg    Note to Pharmacy:  Dr switching pt over to Levaquin 500 mg oral per note.   500 mg Oral Daily 02/08/18 1349 02/13/18 1033   02/08/18 1145  levofloxacin (LEVAQUIN) IVPB 500 mg  Status:  Discontinued     500 mg 100 mL/hr over 60 Minutes Intravenous Every 24 hours 02/08/18 1142 02/08/18 1351   02/08/18 0900  vancomycin (VANCOCIN) IVPB 1000 mg/200 mL premix  Status:  Discontinued     1,000 mg 200 mL/hr over 60 Minutes Intravenous Every 24 hours 02/07/18 1107 02/08/18 1142   02/07/18 0600  ceFEPIme (MAXIPIME) 1 g in sodium chloride 0.9 % 100 mL IVPB  Status:  Discontinued     1 g 200 mL/hr over 30 Minutes Intravenous Every 24 hours 02/06/18 1442 02/08/18 1142   02/06/18 0630  ceFAZolin (ANCEF) IVPB 2g/100 mL premix  Status:  Discontinued     2 g 200 mL/hr over 30 Minutes Intravenous To [facility]Stay [facility] 02/05/18 1035 02/06/18 0925   02/05/18 1015  ceFAZolin (ANCEF) IVPB 2g/100 mL premix     2 g 200 mL/hr over 30 Minutes Intravenous On call to O.R. 02/05/18 1011 02/06/18 0559   02/03/18 1800  vancomycin (VANCOCIN) 500 mg in sodium chloride 0.9 % 100 mL IVPB  Status:  Discontinued     500 mg 100 mL/hr over 60 Minutes Intravenous Every 24 hours 02/02/18 1847 02/02/18 1858  02/03/18 1000  vancomycin (VANCOCIN) IVPB 750 mg/150 ml premix  Status:  Discontinued     750 mg 150 mL/hr over 60 Minutes Intravenous Every 24 hours 02/02/18 1858 02/07/18 1107   02/02/18 2200  ceFEPIme (MAXIPIME) 1 g in sodium chloride 0.9 % 100 mL IVPB  Status:  Discontinued     1 g 200 mL/hr over 30 Minutes Intravenous Every 12 hours 02/02/18 1847 02/02/18 1858   02/02/18 2200  ceFEPIme (MAXIPIME) 1 g in sodium chloride 0.9 % 100 mL IVPB  Status:  Discontinued     1 g 200 mL/hr over 30 Minutes  Intravenous Every 8 hours 02/02/18 1858 02/06/18 1442   02/02/18 1845  ceFEPIme (MAXIPIME) 2 g in sodium chloride 0.9 % 100 mL IVPB  Status:  Discontinued     2 g 200 mL/hr over 30 Minutes Intravenous  Once 02/02/18 1830 02/02/18 1847   02/02/18 1845  vancomycin (VANCOCIN) IVPB 1000 mg/200 mL premix  Status:  Discontinued     1,000 mg 200 mL/hr over 60 Minutes Intravenous  Once 02/02/18 1830 02/02/18 1847   02/02/18 1500  vancomycin (VANCOCIN) IVPB 1000 mg/200 mL premix     1,000 mg 200 mL/hr over 60 Minutes Intravenous  Once 02/02/18 1445 02/02/18 1647   02/02/18 1500  piperacillin-tazobactam (ZOSYN) IVPB 3.375 g     3.375 g 100 mL/hr over 30 Minutes Intravenous  Once 02/02/18 1445 02/02/18 1546      Procedures: 5/5>> bilateral AKA  CONSULTS:  orthopedic surgery  Time spent: 25- minutes-Greater than 50% of this time was spent in counseling, explanation of diagnosis, planning of further management, and coordination of care.  MEDICATIONS: Scheduled Meds: . amLODipine  5 mg Oral Daily  . busPIRone  15 mg Oral TID  . docusate sodium  100 mg Oral BID  . enoxaparin (LOVENOX) injection  30 mg Subcutaneous Q24H  . escitalopram  20 mg Oral QHS  . feeding supplement (ENSURE ENLIVE)  237 mL Oral BID BM  . gabapentin  200 mg Oral BID  . multivitamin with minerals  1 tablet Oral Daily   Continuous Infusions: . methocarbamol (ROBAXIN)  IV     PRN Meds:.acetaminophen, alum & mag hydroxide-simeth, bisacodyl, diphenhydrAMINE, HYDROmorphone (DILAUDID) injection, ibuprofen, ipratropium-albuterol, magnesium citrate, methocarbamol **OR** methocarbamol (ROBAXIN)  IV, metoCLOPramide **OR** metoCLOPramide (REGLAN) injection, morphine injection, ondansetron **OR** ondansetron (ZOFRAN) IV, oxyCODONE, oxyCODONE, polyethylene glycol, zolpidem   PHYSICAL EXAM: Vital signs: Vitals:   02/20/18 1325 02/20/18 2031 02/21/18 0519 02/21/18 1058  BP: 102/67 97/64 (!) 99/52 102/63  Pulse: 74 (!) 53 69 62    Resp: 16  16 16   Temp: 98.2 F (36.8 C) 98.3 F (36.8 C) 98.3 F (36.8 C) 98.1 F (36.7 C)  TempSrc: Oral Oral Oral Oral  SpO2: 99% 95% 100% 100%  Weight:      Height:       Filed Weights   02/02/18 1636  Weight: 36.2 kg (79 lb 12.9 oz)   Body mass index is 15.08 kg/m.   General appearance:Awake, alert, not in any distress.  Eyes:no scleral icterus. HEENT: Atraumatic and Normocephalic Neck: supple, no JVD. Resp:Good air entry bilaterally,no rales or rhonchi CVS: S1 S2 regular, no murmurs.  GI: Bowel sounds present, Non tender and not distended with no gaurding, rigidity or rebound. Extremities: B/L AKA-staples in place-no erythema or swelling or discharge evident Neurology:  Non focal Psychiatric: Normal judgment and insight. Normal mood. Musculoskeletal:No digital cyanosis Skin:No Rash, warm and dry Wounds:N/A  I have  personally reviewed following labs and imaging studies  LABORATORY DATA: CBC: Recent Labs  Lab 02/16/18 0447 02/21/18 0450  WBC 6.7 6.1  HGB 9.4* 10.1*  HCT 29.7* 31.4*  MCV 93.4 92.6  PLT 332 037    Basic Metabolic Panel: Recent Labs  Lab 02/15/18 0450 02/16/18 0447 02/17/18 0434 02/19/18 0732 02/21/18 0450  NA  --  138  --   --  140  K  --  4.7  --   --  4.9  CL  --  106  --   --  105  CO2  --  23  --   --  26  GLUCOSE  --  91  --   --  101*  BUN  --  17  --   --  15  CREATININE 0.93 0.74 0.75 0.70 0.80  CALCIUM  --  8.9  --   --  9.2    GFR: Estimated Creatinine Clearance: 45.9 mL/min (by C-G formula based on SCr of 0.8 mg/dL).  Liver Function Tests: No results for input(s): AST, ALT, ALKPHOS, BILITOT, PROT, ALBUMIN in the last 168 hours. No results for input(s): LIPASE, AMYLASE in the last 168 hours. No results for input(s): AMMONIA in the last 168 hours.  Coagulation Profile: No results for input(s): INR, PROTIME in the last 168 hours.  Cardiac Enzymes: No results for input(s): CKTOTAL, CKMB, CKMBINDEX, TROPONINI in  the last 168 hours.  BNP (last 3 results) No results for input(s): PROBNP in the last 8760 hours.  HbA1C: No results for input(s): HGBA1C in the last 72 hours.  CBG: No results for input(s): GLUCAP in the last 168 hours.  Lipid Profile: No results for input(s): CHOL, HDL, LDLCALC, TRIG, CHOLHDL, LDLDIRECT in the last 72 hours.  Thyroid Function Tests: No results for input(s): TSH, T4TOTAL, FREET4, T3FREE, THYROIDAB in the last 72 hours.  Anemia Panel: No results for input(s): VITAMINB12, FOLATE, FERRITIN, TIBC, IRON, RETICCTPCT in the last 72 hours.  Urine analysis:    Component Value Date/Time   COLORURINE AMBER (A) 01/20/2018 1020   APPEARANCEUR HAZY (A) 01/20/2018 1020   LABSPEC 1.026 01/20/2018 1020   PHURINE 5.0 01/20/2018 1020   GLUCOSEU NEGATIVE 01/20/2018 1020   HGBUR LARGE (A) 01/20/2018 1020   BILIRUBINUR NEGATIVE 01/20/2018 1020   BILIRUBINUR small 04/29/2015 1533   KETONESUR NEGATIVE 01/20/2018 1020   PROTEINUR >=300 (A) 01/20/2018 1020   UROBILINOGEN 1.0 07/26/2017 1550   NITRITE NEGATIVE 01/20/2018 1020   LEUKOCYTESUR NEGATIVE 01/20/2018 1020    Sepsis Labs: Lactic Acid, Venous    Component Value Date/Time   LATICACIDVEN 1.1 02/03/2018 0013    MICROBIOLOGY: No results found for this or any previous visit (from the past 240 hour(s)).  RADIOLOGY STUDIES/RESULTS: Dg Chest 2 View  Addendum Date: 02/02/2018   ADDENDUM REPORT: 02/02/2018 14:33 ADDENDUM: Acute findings discussed with and reconfirmed by Dr.KEVIN CAMPOS on 02/02/2018 at 2:32 pm. Electronically Signed   By: Elon Alas M.D.   On: 02/02/2018 14:33   Result Date: 02/02/2018 CLINICAL DATA:  Mid chest pain, cough and congestion. EXAM: CHEST - 2 VIEW COMPARISON:  Chest radiograph January 20, 2018 FINDINGS: Multifocal RIGHT lung consolidation. Mild chronic interstitial changes without pleural effusion. Borderline cardiomegaly. Calcified aortic knob. No pneumothorax. Soft tissue planes included  osseous structures are nonsuspicious. IMPRESSION: 1. Multifocal RIGHT pneumonia. 2. Borderline cardiomegaly. 3.  Aortic Atherosclerosis (ICD10-I70.0). Electronically Signed: By: Elon Alas M.D. On: 02/02/2018 14:28   Dg Knee 1-2 Views Left  Result  Date: 02/02/2018 CLINICAL DATA:  Knee pain.  Status post BKA. EXAM: LEFT KNEE - 1-2 VIEW COMPARISON:  01/20/2018 FINDINGS: Bones are demineralized. No evidence for an acute fracture. No subluxation or dislocation. No worrisome lytic or sclerotic osseous abnormality. No gross joint effusion evident. There is some soft tissue irregularity at the stump. IMPRESSION: Osteopenia without acute bony abnormality. Soft tissue irregularity at the stump may be postoperative scarring although wound/ulcer could have this appearance. Electronically Signed   By: Misty Stanley M.D.   On: 02/02/2018 14:30   Dg Knee 1-2 Views Right  Result Date: 02/02/2018 CLINICAL DATA:  Pain and necrosis at stump, bleeding. EXAM: RIGHT KNEE - 1-2 VIEW COMPARISON:  RIGHT knee radiograph January 20, 2018 FINDINGS: Status post below-knee amputation. No acute osseous process. No rib fracture deformity or dislocation. No advanced degenerative change for age. Increasing soft tissue swelling with superficial irregularity and subcutaneous gas about the stump. IMPRESSION: Soft tissue swelling with new subcutaneous gas seen with necrotizing fasciitis or recent instrumentation. No acute osseous process. Acute findings discussed with and reconfirmed by Dr.KEVIN CAMPOS on 02/02/2018 at 2:32 pm. Electronically Signed   By: Elon Alas M.D.   On: 02/02/2018 14:32   Ct Head Wo Contrast  Result Date: 02/16/2018 CLINICAL DATA:  Pain following fall EXAM: CT HEAD WITHOUT CONTRAST CT CERVICAL SPINE WITHOUT CONTRAST TECHNIQUE: Multidetector CT imaging of the head and cervical spine was performed following the standard protocol without intravenous contrast. Multiplanar CT image reconstructions of the cervical  spine were also generated. COMPARISON:  Head CT June 08, 2009 FINDINGS: CT HEAD FINDINGS Brain: The ventricles are normal in size and configuration. There is no intracranial mass, hemorrhage, extra-axial fluid collection, or midline shift. Gray-white compartments appear normal. No evident acute infarct. Vascular: There is no hyperdense vessel. There is calcification in each carotid siphon region. Skull: Bony calvarium appears intact. There is a right parietal scalp hematoma. Sinuses/Orbits: There is mucosal thickening in several ethmoid air cells. Other visualized paranasal sinuses are clear. Visualized orbits appear symmetric bilaterally. Note that there is increased attenuation in each globe which potentially may be artifactual. Other: Mastoid air cells are clear. CT CERVICAL SPINE FINDINGS Alignment: There is no appreciable spondylolisthesis. Skull base and vertebrae: Skull base and craniocervical junction regions appear normal. No evident fracture. No blastic or lytic bone lesions. Soft tissues and spinal canal: Prevertebral soft tissues and predental space regions are normal. No paraspinous lesions evident. No cord canal hematoma appreciable. Disc levels: There is moderate disc space narrowing at C5-6 and C6-7. There are anterior osteophytes at C5 and C6. There is a calcified central and left paracentral disc protrusion at C6-7 with mild impression on the ventral cord at this level. There is mild facet hypertrophy at several levels. Upper chest: Visualized upper lung zones are clear. There is a degree of underlying centrilobular emphysematous change. Other: There are foci of calcification in each carotid and subclavian artery. IMPRESSION: CT head: 1. No mass or hemorrhage. Gray-white compartments appear normal. Note that there is a right parietal scalp hematoma. No fracture evident. 2.  Foci of arterial vascular calcification noted. 3.  Mucosal thickening in several ethmoid air cells. 4. Increased  attenuation in both globes may well be artifactual. If patient is reporting vision difficulty, ophthalmological examination in this regard may be reasonable. CT cervical spine: No fracture or spondylolisthesis. Arthropathy at several levels with a calcified central and left paracentral disc protrusion at C6-7. No high-grade stenosis. Foci of calcification in subclavian and  carotid arteries noted. Underlying centrilobular emphysematous change. Emphysema (ICD10-J43.9). Electronically Signed   By: Lowella Grip III M.D.   On: 02/16/2018 13:18   Ct Cervical Spine Wo Contrast  Result Date: 02/16/2018 CLINICAL DATA:  Pain following fall EXAM: CT HEAD WITHOUT CONTRAST CT CERVICAL SPINE WITHOUT CONTRAST TECHNIQUE: Multidetector CT imaging of the head and cervical spine was performed following the standard protocol without intravenous contrast. Multiplanar CT image reconstructions of the cervical spine were also generated. COMPARISON:  Head CT June 08, 2009 FINDINGS: CT HEAD FINDINGS Brain: The ventricles are normal in size and configuration. There is no intracranial mass, hemorrhage, extra-axial fluid collection, or midline shift. Gray-white compartments appear normal. No evident acute infarct. Vascular: There is no hyperdense vessel. There is calcification in each carotid siphon region. Skull: Bony calvarium appears intact. There is a right parietal scalp hematoma. Sinuses/Orbits: There is mucosal thickening in several ethmoid air cells. Other visualized paranasal sinuses are clear. Visualized orbits appear symmetric bilaterally. Note that there is increased attenuation in each globe which potentially may be artifactual. Other: Mastoid air cells are clear. CT CERVICAL SPINE FINDINGS Alignment: There is no appreciable spondylolisthesis. Skull base and vertebrae: Skull base and craniocervical junction regions appear normal. No evident fracture. No blastic or lytic bone lesions. Soft tissues and spinal canal:  Prevertebral soft tissues and predental space regions are normal. No paraspinous lesions evident. No cord canal hematoma appreciable. Disc levels: There is moderate disc space narrowing at C5-6 and C6-7. There are anterior osteophytes at C5 and C6. There is a calcified central and left paracentral disc protrusion at C6-7 with mild impression on the ventral cord at this level. There is mild facet hypertrophy at several levels. Upper chest: Visualized upper lung zones are clear. There is a degree of underlying centrilobular emphysematous change. Other: There are foci of calcification in each carotid and subclavian artery. IMPRESSION: CT head: 1. No mass or hemorrhage. Gray-white compartments appear normal. Note that there is a right parietal scalp hematoma. No fracture evident. 2.  Foci of arterial vascular calcification noted. 3.  Mucosal thickening in several ethmoid air cells. 4. Increased attenuation in both globes may well be artifactual. If patient is reporting vision difficulty, ophthalmological examination in this regard may be reasonable. CT cervical spine: No fracture or spondylolisthesis. Arthropathy at several levels with a calcified central and left paracentral disc protrusion at C6-7. No high-grade stenosis. Foci of calcification in subclavian and carotid arteries noted. Underlying centrilobular emphysematous change. Emphysema (ICD10-J43.9). Electronically Signed   By: Lowella Grip III M.D.   On: 02/16/2018 13:18     LOS: 19 days   Oren Binet, MD  [facility] Pager:[phone]  If 7PM-7AM, please contact night-coverage [email or web address] Password [email or web address] 02/21/2018, 12:01 PM

## 2018-02-21 NOTE — Social Work (Signed)
CSW called admission staff Clarene Critchley [phone] at [facility] for placement. CSW left message.  CSW then called Suanne Marker in admission at [facility] to discuss case and she advised that her director denied patient due to substance use history.  CSW still f/u for placment.  Elissa Hefty, LCSW Clinical Social Worker [phone]

## 2018-02-22 NOTE — Progress Notes (Signed)
PROGRESS NOTE        PATIENT DETAILS Name: [name] Age: 57 y.o. Sex: female Date of Birth: [birth year] Admit Date: 02/02/2018 Admitting Physician Annita Brod, MD PCP:No primary care provider on file.  Brief Narrative: Patient is a 57 y.o. female prior history of cocaine use, hypertension, bilateral BKA-admitted with shortness of breath, found to have pneumonia, she was also found to have bilateral BKA stump infection-she was evaluated by orthopedics and underwent bilateral AKA.  Awaiting SNF placement.  Subjective: No major events overnight.  Lying comfortably in bed without any chest pain or shortness of breath   Assessment/Plan: Sepsis secondary to healthcare associated pneumonia and bilateral BKA stump infection with gangrene: Sepsis pathophysiology has resolved, underwent bilateral AKA-no longer on any antimicrobial therapy-Levaquin is discontinued on 5/12.  Blood cultures negative.  Awaiting SNF bed-per social work difficulttoplace per social work) given prior history of drug use.    Bilateral BKA stump infection with gangrene: Status post bilateral AKA on 5/5-spoke with Dr. Sharol Given on 5/14-okay to remove a wound VAC and place a 4 x 4 dressing with Ace wraps.  Appears to have some phantom pain-with good control with Neurontin. Stump site looks benign.  Mechanical fall: Patient fell from a wheelchair on 5/15, CT of the head and CT of the C-spine negative for abnormalities.  Small scalp hematoma in the occipital area appears to be stable.    Anemia: Multifactorial-probably secondary to acute illness-hemoglobin remained stable, has been transfused a total of 3 units of PRBC so far.  Hypertension: Controlled with Norvasc  Polysubstance abuse/cocaine use: Counseled  Severe protein calorie malnutrition: Continue supplements  DVT Prophylaxis: Prophylactic Lovenox   Code Status: Full code   Family Communication: None at bedside  Disposition  Plan: Remain inpatient-family refused CIR-awaiting SNF placement.  Antimicrobial agents: Anti-infectives (From admission, onward)   Start     Dose/Rate Route Frequency Ordered Stop   02/09/18 1000  levofloxacin (LEVAQUIN) tablet 500 mg    Note to Pharmacy:  Dr switching pt over to Levaquin 500 mg oral per note.   500 mg Oral Daily 02/08/18 1349 02/13/18 1033   02/08/18 1145  levofloxacin (LEVAQUIN) IVPB 500 mg  Status:  Discontinued     500 mg 100 mL/hr over 60 Minutes Intravenous Every 24 hours 02/08/18 1142 02/08/18 1351   02/08/18 0900  vancomycin (VANCOCIN) IVPB 1000 mg/200 mL premix  Status:  Discontinued     1,000 mg 200 mL/hr over 60 Minutes Intravenous Every 24 hours 02/07/18 1107 02/08/18 1142   02/07/18 0600  ceFEPIme (MAXIPIME) 1 g in sodium chloride 0.9 % 100 mL IVPB  Status:  Discontinued     1 g 200 mL/hr over 30 Minutes Intravenous Every 24 hours 02/06/18 1442 02/08/18 1142   02/06/18 0630  ceFAZolin (ANCEF) IVPB 2g/100 mL premix  Status:  Discontinued     2 g 200 mL/hr over 30 Minutes Intravenous To ShortStay [facility] 02/05/18 1035 02/06/18 0925   02/05/18 1015  ceFAZolin (ANCEF) IVPB 2g/100 mL premix     2 g 200 mL/hr over 30 Minutes Intravenous On call to O.R. 02/05/18 1011 02/06/18 0559   02/03/18 1800  vancomycin (VANCOCIN) 500 mg in sodium chloride 0.9 % 100 mL IVPB  Status:  Discontinued     500 mg 100 mL/hr over 60 Minutes Intravenous Every 24 hours 02/02/18 1847  02/02/18 1858   02/03/18 1000  vancomycin (VANCOCIN) IVPB 750 mg/150 ml premix  Status:  Discontinued     750 mg 150 mL/hr over 60 Minutes Intravenous Every 24 hours 02/02/18 1858 02/07/18 1107   02/02/18 2200  ceFEPIme (MAXIPIME) 1 g in sodium chloride 0.9 % 100 mL IVPB  Status:  Discontinued     1 g 200 mL/hr over 30 Minutes Intravenous Every 12 hours 02/02/18 1847 02/02/18 1858   02/02/18 2200  ceFEPIme (MAXIPIME) 1 g in sodium chloride 0.9 % 100 mL IVPB  Status:  Discontinued     1 g 200 mL/hr  over 30 Minutes Intravenous Every 8 hours 02/02/18 1858 02/06/18 1442   02/02/18 1845  ceFEPIme (MAXIPIME) 2 g in sodium chloride 0.9 % 100 mL IVPB  Status:  Discontinued     2 g 200 mL/hr over 30 Minutes Intravenous  Once 02/02/18 1830 02/02/18 1847   02/02/18 1845  vancomycin (VANCOCIN) IVPB 1000 mg/200 mL premix  Status:  Discontinued     1,000 mg 200 mL/hr over 60 Minutes Intravenous  Once 02/02/18 1830 02/02/18 1847   02/02/18 1500  vancomycin (VANCOCIN) IVPB 1000 mg/200 mL premix     1,000 mg 200 mL/hr over 60 Minutes Intravenous  Once 02/02/18 1445 02/02/18 1647   02/02/18 1500  piperacillin-tazobactam (ZOSYN) IVPB 3.375 g     3.375 g 100 mL/hr over 30 Minutes Intravenous  Once 02/02/18 1445 02/02/18 1546      Procedures: 5/5>> bilateral AKA  CONSULTS:  orthopedic surgery  Time spent: 25- minutes-Greater than 50% of this time was spent in counseling, explanation of diagnosis, planning of further management, and coordination of care.  MEDICATIONS: Scheduled Meds: . amLODipine  5 mg Oral Daily  . busPIRone  15 mg Oral TID  . docusate sodium  100 mg Oral BID  . enoxaparin (LOVENOX) injection  30 mg Subcutaneous Q24H  . escitalopram  20 mg Oral QHS  . feeding supplement (ENSURE ENLIVE)  237 mL Oral BID BM  . gabapentin  200 mg Oral BID  . multivitamin with minerals  1 tablet Oral Daily  . polyethylene glycol  17 g Oral Daily   Continuous Infusions: . methocarbamol (ROBAXIN)  IV     PRN Meds:.acetaminophen, alum & mag hydroxide-simeth, bisacodyl, diphenhydrAMINE, HYDROmorphone (DILAUDID) injection, ibuprofen, ipratropium-albuterol, magnesium citrate, methocarbamol **OR** methocarbamol (ROBAXIN)  IV, metoCLOPramide **OR** metoCLOPramide (REGLAN) injection, morphine injection, ondansetron **OR** ondansetron (ZOFRAN) IV, oxyCODONE, oxyCODONE, zolpidem   PHYSICAL EXAM: Vital signs: Vitals:   02/21/18 1058 02/21/18 1410 02/21/18 2319 02/22/18 0608  BP: 102/63 (!) 109/45  (!) 92/45 93/60  Pulse: 62 74 61 62  Resp: 16 16  15   Temp: 98.1 F (36.7 C) 98.1 F (36.7 C) 98.8 F (37.1 C) 98.2 F (36.8 C)  TempSrc: Oral Oral Oral Oral  SpO2: 100% 100% 100% 98%  Weight:      Height:       Filed Weights   02/02/18 1636  Weight: 36.2 kg (79 lb 12.9 oz)   Body mass index is 15.08 kg/m.   General appearance:Awake, alert, not in any distress.  Eyes:no scleral icterus. HEENT: Atraumatic and Normocephalic Neck: supple, no JVD. Resp:Good air entry bilaterally,no rales or rhonchi CVS: S1 S2 regular, no murmurs.  GI: Bowel sounds present, Non tender and not distended with no gaurding, rigidity or rebound. Extremities: B/L AKA-staples in place-no erythema or swelling or discharge evident Neurology:  Non focal Psychiatric: Normal judgment and insight. Normal mood. Musculoskeletal:No digital  cyanosis Skin:No Rash, warm and dry Wounds:N/A  I have personally reviewed following labs and imaging studies  LABORATORY DATA: CBC: Recent Labs  Lab 02/16/18 0447 02/21/18 0450  WBC 6.7 6.1  HGB 9.4* 10.1*  HCT 29.7* 31.4*  MCV 93.4 92.6  PLT 332 151    Basic Metabolic Panel: Recent Labs  Lab 02/16/18 0447 02/17/18 0434 02/19/18 0732 02/21/18 0450  NA 138  --   --  140  K 4.7  --   --  4.9  CL 106  --   --  105  CO2 23  --   --  26  GLUCOSE 91  --   --  101*  BUN 17  --   --  15  CREATININE 0.74 0.75 0.70 0.80  CALCIUM 8.9  --   --  9.2    GFR: Estimated Creatinine Clearance: 45.9 mL/min (by C-G formula based on SCr of 0.8 mg/dL).  Liver Function Tests: No results for input(s): AST, ALT, ALKPHOS, BILITOT, PROT, ALBUMIN in the last 168 hours. No results for input(s): LIPASE, AMYLASE in the last 168 hours. No results for input(s): AMMONIA in the last 168 hours.  Coagulation Profile: No results for input(s): INR, PROTIME in the last 168 hours.  Cardiac Enzymes: No results for input(s): CKTOTAL, CKMB, CKMBINDEX, TROPONINI in the last 168  hours.  BNP (last 3 results) No results for input(s): PROBNP in the last 8760 hours.  HbA1C: No results for input(s): HGBA1C in the last 72 hours.  CBG: No results for input(s): GLUCAP in the last 168 hours.  Lipid Profile: No results for input(s): CHOL, HDL, LDLCALC, TRIG, CHOLHDL, LDLDIRECT in the last 72 hours.  Thyroid Function Tests: No results for input(s): TSH, T4TOTAL, FREET4, T3FREE, THYROIDAB in the last 72 hours.  Anemia Panel: No results for input(s): VITAMINB12, FOLATE, FERRITIN, TIBC, IRON, RETICCTPCT in the last 72 hours.  Urine analysis:    Component Value Date/Time   COLORURINE AMBER (A) 01/20/2018 1020   APPEARANCEUR HAZY (A) 01/20/2018 1020   LABSPEC 1.026 01/20/2018 1020   PHURINE 5.0 01/20/2018 1020   GLUCOSEU NEGATIVE 01/20/2018 1020   HGBUR LARGE (A) 01/20/2018 1020   BILIRUBINUR NEGATIVE 01/20/2018 1020   BILIRUBINUR small 04/29/2015 1533   KETONESUR NEGATIVE 01/20/2018 1020   PROTEINUR >=300 (A) 01/20/2018 1020   UROBILINOGEN 1.0 07/26/2017 1550   NITRITE NEGATIVE 01/20/2018 1020   LEUKOCYTESUR NEGATIVE 01/20/2018 1020    Sepsis Labs: Lactic Acid, Venous    Component Value Date/Time   LATICACIDVEN 1.1 02/03/2018 0013    MICROBIOLOGY: No results found for this or any previous visit (from the past 240 hour(s)).  RADIOLOGY STUDIES/RESULTS: Dg Chest 2 View  Addendum Date: 02/02/2018   ADDENDUM REPORT: 02/02/2018 14:33 ADDENDUM: Acute findings discussed with and reconfirmed by Dr.KEVIN CAMPOS on 02/02/2018 at 2:32 pm. Electronically Signed   By: Elon Alas M.D.   On: 02/02/2018 14:33   Result Date: 02/02/2018 CLINICAL DATA:  Mid chest pain, cough and congestion. EXAM: CHEST - 2 VIEW COMPARISON:  Chest radiograph January 20, 2018 FINDINGS: Multifocal RIGHT lung consolidation. Mild chronic interstitial changes without pleural effusion. Borderline cardiomegaly. Calcified aortic knob. No pneumothorax. Soft tissue planes included osseous structures  are nonsuspicious. IMPRESSION: 1. Multifocal RIGHT pneumonia. 2. Borderline cardiomegaly. 3.  Aortic Atherosclerosis (ICD10-I70.0). Electronically Signed: By: Elon Alas M.D. On: 02/02/2018 14:28   Dg Knee 1-2 Views Left  Result Date: 02/02/2018 CLINICAL DATA:  Knee pain.  Status post BKA. EXAM: LEFT KNEE -  1-2 VIEW COMPARISON:  01/20/2018 FINDINGS: Bones are demineralized. No evidence for an acute fracture. No subluxation or dislocation. No worrisome lytic or sclerotic osseous abnormality. No gross joint effusion evident. There is some soft tissue irregularity at the stump. IMPRESSION: Osteopenia without acute bony abnormality. Soft tissue irregularity at the stump may be postoperative scarring although wound/ulcer could have this appearance. Electronically Signed   By: Misty Stanley M.D.   On: 02/02/2018 14:30   Dg Knee 1-2 Views Right  Result Date: 02/02/2018 CLINICAL DATA:  Pain and necrosis at stump, bleeding. EXAM: RIGHT KNEE - 1-2 VIEW COMPARISON:  RIGHT knee radiograph January 20, 2018 FINDINGS: Status post below-knee amputation. No acute osseous process. No rib fracture deformity or dislocation. No advanced degenerative change for age. Increasing soft tissue swelling with superficial irregularity and subcutaneous gas about the stump. IMPRESSION: Soft tissue swelling with new subcutaneous gas seen with necrotizing fasciitis or recent instrumentation. No acute osseous process. Acute findings discussed with and reconfirmed by Dr.KEVIN CAMPOS on 02/02/2018 at 2:32 pm. Electronically Signed   By: Elon Alas M.D.   On: 02/02/2018 14:32   Ct Head Wo Contrast  Result Date: 02/16/2018 CLINICAL DATA:  Pain following fall EXAM: CT HEAD WITHOUT CONTRAST CT CERVICAL SPINE WITHOUT CONTRAST TECHNIQUE: Multidetector CT imaging of the head and cervical spine was performed following the standard protocol without intravenous contrast. Multiplanar CT image reconstructions of the cervical spine were also  generated. COMPARISON:  Head CT June 08, 2009 FINDINGS: CT HEAD FINDINGS Brain: The ventricles are normal in size and configuration. There is no intracranial mass, hemorrhage, extra-axial fluid collection, or midline shift. Gray-white compartments appear normal. No evident acute infarct. Vascular: There is no hyperdense vessel. There is calcification in each carotid siphon region. Skull: Bony calvarium appears intact. There is a right parietal scalp hematoma. Sinuses/Orbits: There is mucosal thickening in several ethmoid air cells. Other visualized paranasal sinuses are clear. Visualized orbits appear symmetric bilaterally. Note that there is increased attenuation in each globe which potentially may be artifactual. Other: Mastoid air cells are clear. CT CERVICAL SPINE FINDINGS Alignment: There is no appreciable spondylolisthesis. Skull base and vertebrae: Skull base and craniocervical junction regions appear normal. No evident fracture. No blastic or lytic bone lesions. Soft tissues and spinal canal: Prevertebral soft tissues and predental space regions are normal. No paraspinous lesions evident. No cord canal hematoma appreciable. Disc levels: There is moderate disc space narrowing at C5-6 and C6-7. There are anterior osteophytes at C5 and C6. There is a calcified central and left paracentral disc protrusion at C6-7 with mild impression on the ventral cord at this level. There is mild facet hypertrophy at several levels. Upper chest: Visualized upper lung zones are clear. There is a degree of underlying centrilobular emphysematous change. Other: There are foci of calcification in each carotid and subclavian artery. IMPRESSION: CT head: 1. No mass or hemorrhage. Gray-white compartments appear normal. Note that there is a right parietal scalp hematoma. No fracture evident. 2.  Foci of arterial vascular calcification noted. 3.  Mucosal thickening in several ethmoid air cells. 4. Increased attenuation in both  globes may well be artifactual. If patient is reporting vision difficulty, ophthalmological examination in this regard may be reasonable. CT cervical spine: No fracture or spondylolisthesis. Arthropathy at several levels with a calcified central and left paracentral disc protrusion at C6-7. No high-grade stenosis. Foci of calcification in subclavian and carotid arteries noted. Underlying centrilobular emphysematous change. Emphysema (ICD10-J43.9). Electronically Signed   By: Gwyndolyn Saxon  Jasmine December III M.D.   On: 02/16/2018 13:18   Ct Cervical Spine Wo Contrast  Result Date: 02/16/2018 CLINICAL DATA:  Pain following fall EXAM: CT HEAD WITHOUT CONTRAST CT CERVICAL SPINE WITHOUT CONTRAST TECHNIQUE: Multidetector CT imaging of the head and cervical spine was performed following the standard protocol without intravenous contrast. Multiplanar CT image reconstructions of the cervical spine were also generated. COMPARISON:  Head CT June 08, 2009 FINDINGS: CT HEAD FINDINGS Brain: The ventricles are normal in size and configuration. There is no intracranial mass, hemorrhage, extra-axial fluid collection, or midline shift. Gray-white compartments appear normal. No evident acute infarct. Vascular: There is no hyperdense vessel. There is calcification in each carotid siphon region. Skull: Bony calvarium appears intact. There is a right parietal scalp hematoma. Sinuses/Orbits: There is mucosal thickening in several ethmoid air cells. Other visualized paranasal sinuses are clear. Visualized orbits appear symmetric bilaterally. Note that there is increased attenuation in each globe which potentially may be artifactual. Other: Mastoid air cells are clear. CT CERVICAL SPINE FINDINGS Alignment: There is no appreciable spondylolisthesis. Skull base and vertebrae: Skull base and craniocervical junction regions appear normal. No evident fracture. No blastic or lytic bone lesions. Soft tissues and spinal canal: Prevertebral soft  tissues and predental space regions are normal. No paraspinous lesions evident. No cord canal hematoma appreciable. Disc levels: There is moderate disc space narrowing at C5-6 and C6-7. There are anterior osteophytes at C5 and C6. There is a calcified central and left paracentral disc protrusion at C6-7 with mild impression on the ventral cord at this level. There is mild facet hypertrophy at several levels. Upper chest: Visualized upper lung zones are clear. There is a degree of underlying centrilobular emphysematous change. Other: There are foci of calcification in each carotid and subclavian artery. IMPRESSION: CT head: 1. No mass or hemorrhage. Gray-white compartments appear normal. Note that there is a right parietal scalp hematoma. No fracture evident. 2.  Foci of arterial vascular calcification noted. 3.  Mucosal thickening in several ethmoid air cells. 4. Increased attenuation in both globes may well be artifactual. If patient is reporting vision difficulty, ophthalmological examination in this regard may be reasonable. CT cervical spine: No fracture or spondylolisthesis. Arthropathy at several levels with a calcified central and left paracentral disc protrusion at C6-7. No high-grade stenosis. Foci of calcification in subclavian and carotid arteries noted. Underlying centrilobular emphysematous change. Emphysema (ICD10-J43.9). Electronically Signed   By: Lowella Grip III M.D.   On: 02/16/2018 13:18     LOS: 20 days   Oren Binet, MD  [facility] Pager:[phone]  If 7PM-7AM, please contact night-coverage [email or web address] Password [email or web address] 02/22/2018, 1:28 PM

## 2018-02-22 NOTE — Progress Notes (Addendum)
Occupational Therapy Treatment Patient Details Name: [name] MRN: [id number] DOB: December 04, 1960 Today's Date: 02/22/2018    History of present illness 57 y.o. female admitted on 02/02/18 for bilateral leg pain and trouble breathing.  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  Pt with significant PMH of vasculitis, anemia, migraines, inflammatory arthritis, cocaine abuse, multiple bil amputations of LEs.     OT comments  Pt is making good progress with ADL independence. She reports that she assisted with LB bathing tasks with nurse tech today. Pt educated concerning use of BSC for toileting tasks with assistance from staff and she was able to complete with min guard assist and cues from OT today. Initial time frame for goals has been met and OT updated goals to reflect current progress with ADL in care plans section. Provided upgraded theraband (level 3) as pt's previous theraband was removed when she changed to low bed. She demonstrates understanding of HEP. Will continue to follow.    Follow Up Recommendations  SNF;Supervision/Assistance - 24 hour    Equipment Recommendations  3 in 1 bedside commode(drop-arm 3-in-1)    Recommendations for Other Services      Precautions / Restrictions Precautions Precautions: Fall Restrictions Weight Bearing Restrictions: Yes RLE Weight Bearing: Non weight bearing LLE Weight Bearing: Non weight bearing       Mobility Bed Mobility Overal bed mobility: Modified Independent Bed Mobility: Supine to Sit;Sit to Supine     Supine to sit: Modified independent (Device/Increase time) Sit to supine: Modified independent (Device/Increase time)      Transfers Overall transfer level: Needs assistance Equipment used: None Transfers: Comptroller transfers: Min guard   General transfer comment:  Min guard assist for safety when completing transfer to and from [facility].     Balance Overall balance assessment: Needs assistance Sitting-balance support: Feet unsupported;Bilateral upper extremity supported   Sitting balance - Comments: Relies on B UE support.                                    ADL either performed or assessed with clinical judgement   ADL Overall ADL's : Needs assistance/impaired                         Toilet Transfer: Min guard;Anterior/posterior;BSC Armed forces technical officer Details (indicate cue type and reason): Pt able to complete anterior/posterior transfer from bed to [facility] and encouraged pt to use BSC rather than bed pain Toileting- Clothing Manipulation and Hygiene: Moderate assistance;Sitting/lateral lean Toileting - Clothing Manipulation Details (indicate cue type and reason): Difficulty with pericare with lateral leans and will need further practice.        General ADL Comments: Pt reports that she was able to bathe her UB independently and assist wtih LB today. Session focused on education concerning use of BSC for toileting tasks to increase mobility and independence with ADL tasks.      Vision       Perception     Praxis      Cognition Arousal/Alertness: Awake/alert Behavior During Therapy: WFL for tasks assessed/performed Overall Cognitive Status: Within Functional Limits for tasks assessed  Exercises Other Exercises Other Exercises: When pt was moved to low bed, theraband was not retained. OT provided green level 3 theraband for pt to continue with HEP. She has HEP handout present.    Shoulder Instructions       General Comments Educated concerning use of BSC rather than bed pan for bowel movement with staff assistance.     Pertinent Vitals/ Pain       Pain Assessment: Faces Faces Pain Scale: Hurts a little bit Pain Location: bil residual limbs Pain Descriptors /  Indicators: Discomfort Pain Intervention(s): Limited activity within patient's tolerance;Monitored during session  Home Living                                          Prior Functioning/Environment              Frequency  Min 1X/week        Progress Toward Goals  OT Goals(current goals can now be found in the care plan section)  Progress towards OT goals: Progressing toward goals(Goals updated in care plans as time frame has elapsed)  Acute Rehab OT Goals Patient Stated Goal: to be more independent OT Goal Formulation: With patient Time For Goal Achievement: 03/09/18 Potential to Achieve Goals: Good ADL Goals Pt Will Perform Upper Body Bathing: with modified independence;sitting Pt Will Perform Lower Body Bathing: with modified independence;sitting/lateral leans Pt Will Perform Lower Body Dressing: with modified independence;sitting/lateral leans Pt Will Transfer to Toilet: with modified independence;bedside commode;with transfer board Pt Will Perform Toileting - Clothing Manipulation and hygiene: with modified independence;sit to/from stand  Plan Discharge plan remains appropriate;Frequency needs to be updated    Co-evaluation                 AM-PAC PT "6 Clicks" Daily Activity     Outcome Measure   Help from another person eating meals?: None Help from another person taking care of personal grooming?: A Little Help from another person toileting, which includes using toliet, bedpan, or urinal?: A Lot Help from another person bathing (including washing, rinsing, drying)?: A Little Help from another person to put on and taking off regular upper body clothing?: A Little Help from another person to put on and taking off regular lower body clothing?: A Little 6 Click Score: 18    End of Session    OT Visit Diagnosis: Muscle weakness (generalized) (M62.81);Other abnormalities of gait and mobility (R26.89)   Activity Tolerance Patient  tolerated treatment well   Patient Left in bed;with call bell/phone within reach;with nursing/sitter in room   Nurse Communication Mobility status        Time: 6122-4497 OT Time Calculation (min): 19 min  Charges: OT General Charges $OT Visit: 1 Visit OT Treatments $Self Care/Home Management : 8-22 mins  Norman Herrlich, MS OTR/L  Pager: [phone] 02/22/2018, 2:12 PM

## 2018-02-22 NOTE — Progress Notes (Signed)
Physical Therapy Treatment Patient Details Name: [name] MRN: [id number] DOB: [birth year] Today's Date: 02/22/2018    History of Present Illness 57 y.o. female admitted on 02/02/18 for bilateral leg pain and trouble breathing.  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  Pt with significant PMH of vasculitis, anemia, migraines, inflammatory arthritis, cocaine abuse, multiple bil amputations of LEs.      PT Comments    Pt performed supine and sidelying exercises to promote strength and encourage ROM.  Pt tolerated well.  Issued HEP and reviewed technique and frequency.  Pt remains to await placement to SNF at this time.     Follow Up Recommendations  SNF     Equipment Recommendations  None recommended by PT    Recommendations for Other Services       Precautions / Restrictions Precautions Precautions: Fall Restrictions Weight Bearing Restrictions: Yes RLE Weight Bearing: Non weight bearing LLE Weight Bearing: Non weight bearing    Mobility  Bed Mobility               General bed mobility comments: Pt performed long sitting x 5 reps unassisted with heavy use of rails.    Transfers                    Ambulation/Gait                 Stairs             Wheelchair Mobility    Modified Rankin (Stroke Patients Only)       Balance                                            Cognition Arousal/Alertness: Awake/alert Behavior During Therapy: WFL for tasks assessed/performed Overall Cognitive Status: Within Functional Limits for tasks assessed                                        Exercises Amputee Exercises Quad Sets: AROM;Both;10 reps;Supine Gluteal Sets: AROM;Both;10 reps;Supine Towel Squeeze: AROM;Both;10 reps;Supine Hip Extension: AROM;Both;10 reps;Sidelying Hip ABduction/ADduction:  AROM;Both;10 reps;Sidelying Hip Flexion/Marching: AROM;Both;10 reps;Supine    General Comments        Pertinent Vitals/Pain Pain Assessment: 0-10 Pain Score: 5  Pain Location: bil residual limbs Pain Descriptors / Indicators: Discomfort Pain Intervention(s): Monitored during session;Repositioned    Home Living                      Prior Function            PT Goals (current goals can now be found in the care plan section) Acute Rehab PT Goals Patient Stated Goal: to be more independent Potential to Achieve Goals: Good Progress towards PT goals: Progressing toward goals    Frequency    Min 2X/week      PT Plan Current plan remains appropriate    Co-evaluation              AM-PAC PT "6 Clicks" Daily Activity  Outcome Measure  Difficulty turning over in bed (including adjusting bedclothes, sheets and blankets)?: A Little Difficulty moving from lying on  back to sitting on the side of the bed? : A Little Difficulty sitting down on and standing up from a chair with arms (e.g., wheelchair, bedside commode, etc,.)?: Unable Help needed moving to and from a bed to chair (including a wheelchair)?: A Little Help needed walking in hospital room?: A Little Help needed climbing 3-5 steps with a railing? : A Little 6 Click Score: 16    End of Session Equipment Utilized During Treatment: Gait belt Activity Tolerance: Patient limited by pain Patient left: with call bell/phone within reach;in bed Nurse Communication: Mobility status PT Visit Diagnosis: Muscle weakness (generalized) (M62.81);Pain Pain - Right/Left: (Bilateral) Pain - part of body: (legs)     Time: 2552-5894 PT Time Calculation (min) (ACUTE ONLY): 37 min  Charges:  $Therapeutic Exercise: 23-37 mins                    G Codes:       Governor Rooks, PTA pager [phone]    [name] 02/22/2018, 5:44 PM

## 2018-02-23 LAB — CREATININE, SERUM
Creatinine, Ser: 0.69 mg/dL (ref 0.44–1.00)
GFR calc Af Amer: >60 mL/min (ref >60)

## 2018-02-23 NOTE — Progress Notes (Signed)
   02/23/18 1100  Clinical Encounter Type  Visited With Patient  Visit Type Follow-up;Spiritual support  Referral From Social work  Consult/Referral To Chaplain  Spiritual Encounters  Spiritual Needs Emotional  Chaplain visited with PT per request of social worker.  Pt and I had conversation about her life, those circumstances that lander here here and also that she is tired of her addiction.  She is seeking placement to help her turn the corner.  PT was very tearful in conversation with the Chaplain.  It was hard to decipher if the tears were positive or negative affect for her.

## 2018-02-23 NOTE — Plan of Care (Signed)
  Problem: Activity: Goal: Risk for activity intolerance will decrease Outcome: Progressing   

## 2018-02-23 NOTE — Progress Notes (Signed)
Sutures removed today per dr order. Patient tolerated well. Both stumps rewrapped. Staples still in place.

## 2018-02-23 NOTE — Progress Notes (Signed)
PROGRESS NOTE        PATIENT DETAILS Name: [name] Age: 57 y.o. Sex: female Date of Birth: May 02, 1961 Admit Date: 02/02/2018 Admitting Physician Annita Brod, MD PCP:No primary care provider on file.  Brief Narrative: Patient is a 57 y.o. female prior history of cocaine use, hypertension, bilateral BKA-admitted with shortness of breath, found to have pneumonia, she was also found to have bilateral BKA stump infection-she was evaluated by orthopedics and underwent bilateral AKA.  Awaiting SNF placement.  Subjective: No major events overnight.  Lying comfortably in bed without any chest pain or shortness of breath  Assessment/Plan: Sepsis secondary to healthcare associated pneumonia and bilateral BKA stump infection with gangrene: Sepsis pathophysiology has resolved, underwent bilateral AKA-no longer on any antimicrobial therapy-Levaquin is discontinued on 5/12.  Blood cultures negative.  Awaiting SNF bed-per social work difficulttoplace per social work) given prior history of drug use.    Bilateral BKA stump infection with gangrene: Status post bilateral AKA on 5/5-spoke with Dr. Sharol Given on 5/14-okay to remove a wound VAC and place a 4 x 4 dressing with Ace wraps.  Appears to have some phantom pain-with good control with Neurontin. Stump site looks benign.Spoke with Dr Sharol Given on 5/22-ok to remove staples.   Mechanical fall: Patient fell from a wheelchair on 5/15, CT of the head and CT of the C-spine negative for abnormalities.  Small scalp hematoma in the occipital area appears to be stable.    Anemia: Multifactorial-probably secondary to acute illness-hemoglobin remained stable, has been transfused a total of 3 units of PRBC so far.  Hypertension: Controlled with Norvasc  Polysubstance abuse/cocaine use: Counseled  Severe protein calorie malnutrition: Continue supplements  DVT Prophylaxis: Prophylactic Lovenox   Code Status: Full code   Family  Communication: None at bedside  Disposition Plan: Remain inpatient-family refused CIR-awaiting SNF placement.  Antimicrobial agents: Anti-infectives (From admission, onward)   Start     Dose/Rate Route Frequency Ordered Stop   02/09/18 1000  levofloxacin (LEVAQUIN) tablet 500 mg    Note to Pharmacy:  Dr switching pt over to Levaquin 500 mg oral per note.   500 mg Oral Daily 02/08/18 1349 02/13/18 1033   02/08/18 1145  levofloxacin (LEVAQUIN) IVPB 500 mg  Status:  Discontinued     500 mg 100 mL/hr over 60 Minutes Intravenous Every 24 hours 02/08/18 1142 02/08/18 1351   02/08/18 0900  vancomycin (VANCOCIN) IVPB 1000 mg/200 mL premix  Status:  Discontinued     1,000 mg 200 mL/hr over 60 Minutes Intravenous Every 24 hours 02/07/18 1107 02/08/18 1142   02/07/18 0600  ceFEPIme (MAXIPIME) 1 g in sodium chloride 0.9 % 100 mL IVPB  Status:  Discontinued     1 g 200 mL/hr over 30 Minutes Intravenous Every 24 hours 02/06/18 1442 02/08/18 1142   02/06/18 0630  ceFAZolin (ANCEF) IVPB 2g/100 mL premix  Status:  Discontinued     2 g 200 mL/hr over 30 Minutes Intravenous To ShortStay [facility] 02/05/18 1035 02/06/18 0925   02/05/18 1015  ceFAZolin (ANCEF) IVPB 2g/100 mL premix     2 g 200 mL/hr over 30 Minutes Intravenous On call to O.R. 02/05/18 1011 02/06/18 0559   02/03/18 1800  vancomycin (VANCOCIN) 500 mg in sodium chloride 0.9 % 100 mL IVPB  Status:  Discontinued     500 mg 100 mL/hr over  60 Minutes Intravenous Every 24 hours 02/02/18 1847 02/02/18 1858   02/03/18 1000  vancomycin (VANCOCIN) IVPB 750 mg/150 ml premix  Status:  Discontinued     750 mg 150 mL/hr over 60 Minutes Intravenous Every 24 hours 02/02/18 1858 02/07/18 1107   02/02/18 2200  ceFEPIme (MAXIPIME) 1 g in sodium chloride 0.9 % 100 mL IVPB  Status:  Discontinued     1 g 200 mL/hr over 30 Minutes Intravenous Every 12 hours 02/02/18 1847 02/02/18 1858   02/02/18 2200  ceFEPIme (MAXIPIME) 1 g in sodium chloride 0.9 % 100 mL  IVPB  Status:  Discontinued     1 g 200 mL/hr over 30 Minutes Intravenous Every 8 hours 02/02/18 1858 02/06/18 1442   02/02/18 1845  ceFEPIme (MAXIPIME) 2 g in sodium chloride 0.9 % 100 mL IVPB  Status:  Discontinued     2 g 200 mL/hr over 30 Minutes Intravenous  Once 02/02/18 1830 02/02/18 1847   02/02/18 1845  vancomycin (VANCOCIN) IVPB 1000 mg/200 mL premix  Status:  Discontinued     1,000 mg 200 mL/hr over 60 Minutes Intravenous  Once 02/02/18 1830 02/02/18 1847   02/02/18 1500  vancomycin (VANCOCIN) IVPB 1000 mg/200 mL premix     1,000 mg 200 mL/hr over 60 Minutes Intravenous  Once 02/02/18 1445 02/02/18 1647   02/02/18 1500  piperacillin-tazobactam (ZOSYN) IVPB 3.375 g     3.375 g 100 mL/hr over 30 Minutes Intravenous  Once 02/02/18 1445 02/02/18 1546      Procedures: 5/5>> bilateral AKA  CONSULTS:  orthopedic surgery  Time spent: 25- minutes-Greater than 50% of this time was spent in counseling, explanation of diagnosis, planning of further management, and coordination of care.  MEDICATIONS: Scheduled Meds: . amLODipine  5 mg Oral Daily  . busPIRone  15 mg Oral TID  . docusate sodium  100 mg Oral BID  . enoxaparin (LOVENOX) injection  30 mg Subcutaneous Q24H  . escitalopram  20 mg Oral QHS  . feeding supplement (ENSURE ENLIVE)  237 mL Oral BID BM  . gabapentin  200 mg Oral BID  . multivitamin with minerals  1 tablet Oral Daily  . polyethylene glycol  17 g Oral Daily   Continuous Infusions: . methocarbamol (ROBAXIN)  IV     PRN Meds:.acetaminophen, alum & mag hydroxide-simeth, bisacodyl, diphenhydrAMINE, HYDROmorphone (DILAUDID) injection, ibuprofen, ipratropium-albuterol, magnesium citrate, methocarbamol **OR** methocarbamol (ROBAXIN)  IV, metoCLOPramide **OR** metoCLOPramide (REGLAN) injection, morphine injection, ondansetron **OR** ondansetron (ZOFRAN) IV, oxyCODONE, oxyCODONE, zolpidem   PHYSICAL EXAM: Vital signs: Vitals:   02/22/18 1432 02/22/18 1952  02/23/18 0455 02/23/18 1115  BP: 108/65 (!) 88/55 (!) 85/51 (!) 102/50  Pulse: 62 (!) 58 64   Resp: 16     Temp: 98.2 F (36.8 C) 98.3 F (36.8 C) 98.5 F (36.9 C)   TempSrc: Oral Oral Oral   SpO2: 100% 100% 100%   Weight:      Height:       Filed Weights   02/02/18 1636  Weight: 36.2 kg (79 lb 12.9 oz)   Body mass index is 15.08 kg/m.   General appearance:Awake, alert, not in any distress.  Eyes:no scleral icterus. HEENT: Atraumatic and Normocephalic Neck: supple, no JVD. Resp:Good air entry bilaterally,no rales or rhonchi CVS: S1 S2 regular, no murmurs.  GI: Bowel sounds present, Non tender and not distended with no gaurding, rigidity or rebound. Extremities: B/L AKA-staples in place-no erythema or swelling or discharge evident Neurology:  Non focal Psychiatric: Normal  judgment and insight. Normal mood. Musculoskeletal:No digital cyanosis Skin:No Rash, warm and dry Wounds:N/A  I have personally reviewed following labs and imaging studies  LABORATORY DATA: CBC: Recent Labs  Lab 02/21/18 0450  WBC 6.1  HGB 10.1*  HCT 31.4*  MCV 92.6  PLT 062    Basic Metabolic Panel: Recent Labs  Lab 02/17/18 0434 02/19/18 0732 02/21/18 0450 02/23/18 0441  NA  --   --  140  --   K  --   --  4.9  --   CL  --   --  105  --   CO2  --   --  26  --   GLUCOSE  --   --  101*  --   BUN  --   --  15  --   CREATININE 0.75 0.70 0.80 0.69  CALCIUM  --   --  9.2  --     GFR: Estimated Creatinine Clearance: 45.9 mL/min (by C-G formula based on SCr of 0.69 mg/dL).  Liver Function Tests: No results for input(s): AST, ALT, ALKPHOS, BILITOT, PROT, ALBUMIN in the last 168 hours. No results for input(s): LIPASE, AMYLASE in the last 168 hours. No results for input(s): AMMONIA in the last 168 hours.  Coagulation Profile: No results for input(s): INR, PROTIME in the last 168 hours.  Cardiac Enzymes: No results for input(s): CKTOTAL, CKMB, CKMBINDEX, TROPONINI in the last 168  hours.  BNP (last 3 results) No results for input(s): PROBNP in the last 8760 hours.  HbA1C: No results for input(s): HGBA1C in the last 72 hours.  CBG: No results for input(s): GLUCAP in the last 168 hours.  Lipid Profile: No results for input(s): CHOL, HDL, LDLCALC, TRIG, CHOLHDL, LDLDIRECT in the last 72 hours.  Thyroid Function Tests: No results for input(s): TSH, T4TOTAL, FREET4, T3FREE, THYROIDAB in the last 72 hours.  Anemia Panel: No results for input(s): VITAMINB12, FOLATE, FERRITIN, TIBC, IRON, RETICCTPCT in the last 72 hours.  Urine analysis:    Component Value Date/Time   COLORURINE AMBER (A) 01/20/2018 1020   APPEARANCEUR HAZY (A) 01/20/2018 1020   LABSPEC 1.026 01/20/2018 1020   PHURINE 5.0 01/20/2018 1020   GLUCOSEU NEGATIVE 01/20/2018 1020   HGBUR LARGE (A) 01/20/2018 1020   BILIRUBINUR NEGATIVE 01/20/2018 1020   BILIRUBINUR small 04/29/2015 1533   KETONESUR NEGATIVE 01/20/2018 1020   PROTEINUR >=300 (A) 01/20/2018 1020   UROBILINOGEN 1.0 07/26/2017 1550   NITRITE NEGATIVE 01/20/2018 1020   LEUKOCYTESUR NEGATIVE 01/20/2018 1020    Sepsis Labs: Lactic Acid, Venous    Component Value Date/Time   LATICACIDVEN 1.1 02/03/2018 0013    MICROBIOLOGY: No results found for this or any previous visit (from the past 240 hour(s)).  RADIOLOGY STUDIES/RESULTS: Dg Chest 2 View  Addendum Date: 02/02/2018   ADDENDUM REPORT: 02/02/2018 14:33 ADDENDUM: Acute findings discussed with and reconfirmed by Dr.KEVIN CAMPOS on 02/02/2018 at 2:32 pm. Electronically Signed   By: Elon Alas M.D.   On: 02/02/2018 14:33   Result Date: 02/02/2018 CLINICAL DATA:  Mid chest pain, cough and congestion. EXAM: CHEST - 2 VIEW COMPARISON:  Chest radiograph January 20, 2018 FINDINGS: Multifocal RIGHT lung consolidation. Mild chronic interstitial changes without pleural effusion. Borderline cardiomegaly. Calcified aortic knob. No pneumothorax. Soft tissue planes included osseous structures  are nonsuspicious. IMPRESSION: 1. Multifocal RIGHT pneumonia. 2. Borderline cardiomegaly. 3.  Aortic Atherosclerosis (ICD10-I70.0). Electronically Signed: By: Elon Alas M.D. On: 02/02/2018 14:28   Dg Knee 1-2 Views Left  Result  Date: 02/02/2018 CLINICAL DATA:  Knee pain.  Status post BKA. EXAM: LEFT KNEE - 1-2 VIEW COMPARISON:  01/20/2018 FINDINGS: Bones are demineralized. No evidence for an acute fracture. No subluxation or dislocation. No worrisome lytic or sclerotic osseous abnormality. No gross joint effusion evident. There is some soft tissue irregularity at the stump. IMPRESSION: Osteopenia without acute bony abnormality. Soft tissue irregularity at the stump may be postoperative scarring although wound/ulcer could have this appearance. Electronically Signed   By: Misty Stanley M.D.   On: 02/02/2018 14:30   Dg Knee 1-2 Views Right  Result Date: 02/02/2018 CLINICAL DATA:  Pain and necrosis at stump, bleeding. EXAM: RIGHT KNEE - 1-2 VIEW COMPARISON:  RIGHT knee radiograph January 20, 2018 FINDINGS: Status post below-knee amputation. No acute osseous process. No rib fracture deformity or dislocation. No advanced degenerative change for age. Increasing soft tissue swelling with superficial irregularity and subcutaneous gas about the stump. IMPRESSION: Soft tissue swelling with new subcutaneous gas seen with necrotizing fasciitis or recent instrumentation. No acute osseous process. Acute findings discussed with and reconfirmed by Dr.KEVIN CAMPOS on 02/02/2018 at 2:32 pm. Electronically Signed   By: Elon Alas M.D.   On: 02/02/2018 14:32   Ct Head Wo Contrast  Result Date: 02/16/2018 CLINICAL DATA:  Pain following fall EXAM: CT HEAD WITHOUT CONTRAST CT CERVICAL SPINE WITHOUT CONTRAST TECHNIQUE: Multidetector CT imaging of the head and cervical spine was performed following the standard protocol without intravenous contrast. Multiplanar CT image reconstructions of the cervical spine were also  generated. COMPARISON:  Head CT June 08, 2009 FINDINGS: CT HEAD FINDINGS Brain: The ventricles are normal in size and configuration. There is no intracranial mass, hemorrhage, extra-axial fluid collection, or midline shift. Gray-white compartments appear normal. No evident acute infarct. Vascular: There is no hyperdense vessel. There is calcification in each carotid siphon region. Skull: Bony calvarium appears intact. There is a right parietal scalp hematoma. Sinuses/Orbits: There is mucosal thickening in several ethmoid air cells. Other visualized paranasal sinuses are clear. Visualized orbits appear symmetric bilaterally. Note that there is increased attenuation in each globe which potentially may be artifactual. Other: Mastoid air cells are clear. CT CERVICAL SPINE FINDINGS Alignment: There is no appreciable spondylolisthesis. Skull base and vertebrae: Skull base and craniocervical junction regions appear normal. No evident fracture. No blastic or lytic bone lesions. Soft tissues and spinal canal: Prevertebral soft tissues and predental space regions are normal. No paraspinous lesions evident. No cord canal hematoma appreciable. Disc levels: There is moderate disc space narrowing at C5-6 and C6-7. There are anterior osteophytes at C5 and C6. There is a calcified central and left paracentral disc protrusion at C6-7 with mild impression on the ventral cord at this level. There is mild facet hypertrophy at several levels. Upper chest: Visualized upper lung zones are clear. There is a degree of underlying centrilobular emphysematous change. Other: There are foci of calcification in each carotid and subclavian artery. IMPRESSION: CT head: 1. No mass or hemorrhage. Gray-white compartments appear normal. Note that there is a right parietal scalp hematoma. No fracture evident. 2.  Foci of arterial vascular calcification noted. 3.  Mucosal thickening in several ethmoid air cells. 4. Increased attenuation in both  globes may well be artifactual. If patient is reporting vision difficulty, ophthalmological examination in this regard may be reasonable. CT cervical spine: No fracture or spondylolisthesis. Arthropathy at several levels with a calcified central and left paracentral disc protrusion at C6-7. No high-grade stenosis. Foci of calcification in subclavian and  carotid arteries noted. Underlying centrilobular emphysematous change. Emphysema (ICD10-J43.9). Electronically Signed   By: Lowella Grip III M.D.   On: 02/16/2018 13:18   Ct Cervical Spine Wo Contrast  Result Date: 02/16/2018 CLINICAL DATA:  Pain following fall EXAM: CT HEAD WITHOUT CONTRAST CT CERVICAL SPINE WITHOUT CONTRAST TECHNIQUE: Multidetector CT imaging of the head and cervical spine was performed following the standard protocol without intravenous contrast. Multiplanar CT image reconstructions of the cervical spine were also generated. COMPARISON:  Head CT June 08, 2009 FINDINGS: CT HEAD FINDINGS Brain: The ventricles are normal in size and configuration. There is no intracranial mass, hemorrhage, extra-axial fluid collection, or midline shift. Gray-white compartments appear normal. No evident acute infarct. Vascular: There is no hyperdense vessel. There is calcification in each carotid siphon region. Skull: Bony calvarium appears intact. There is a right parietal scalp hematoma. Sinuses/Orbits: There is mucosal thickening in several ethmoid air cells. Other visualized paranasal sinuses are clear. Visualized orbits appear symmetric bilaterally. Note that there is increased attenuation in each globe which potentially may be artifactual. Other: Mastoid air cells are clear. CT CERVICAL SPINE FINDINGS Alignment: There is no appreciable spondylolisthesis. Skull base and vertebrae: Skull base and craniocervical junction regions appear normal. No evident fracture. No blastic or lytic bone lesions. Soft tissues and spinal canal: Prevertebral soft  tissues and predental space regions are normal. No paraspinous lesions evident. No cord canal hematoma appreciable. Disc levels: There is moderate disc space narrowing at C5-6 and C6-7. There are anterior osteophytes at C5 and C6. There is a calcified central and left paracentral disc protrusion at C6-7 with mild impression on the ventral cord at this level. There is mild facet hypertrophy at several levels. Upper chest: Visualized upper lung zones are clear. There is a degree of underlying centrilobular emphysematous change. Other: There are foci of calcification in each carotid and subclavian artery. IMPRESSION: CT head: 1. No mass or hemorrhage. Gray-white compartments appear normal. Note that there is a right parietal scalp hematoma. No fracture evident. 2.  Foci of arterial vascular calcification noted. 3.  Mucosal thickening in several ethmoid air cells. 4. Increased attenuation in both globes may well be artifactual. If patient is reporting vision difficulty, ophthalmological examination in this regard may be reasonable. CT cervical spine: No fracture or spondylolisthesis. Arthropathy at several levels with a calcified central and left paracentral disc protrusion at C6-7. No high-grade stenosis. Foci of calcification in subclavian and carotid arteries noted. Underlying centrilobular emphysematous change. Emphysema (ICD10-J43.9). Electronically Signed   By: Lowella Grip III M.D.   On: 02/16/2018 13:18     LOS: 21 days   Oren Binet, MD  [facility] Pager:[phone]  If 7PM-7AM, please contact night-coverage [email or web address] Password [facility] 02/23/2018, 12:23 PM

## 2018-02-23 NOTE — Progress Notes (Signed)
02/23/2018 PTA alerted PT that goal update was due.  PT reviewed chart and updated goals.  If next goals are met, pt will be mobile enough to d/c from PT.  Barbarann Ehlers [place], [place], DPT [phone]

## 2018-02-24 MED ORDER — AMLODIPINE BESYLATE 2.5 MG PO TABS
2.5000 mg | ORAL_TABLET | Freq: Every day | ORAL | Status: DC
  Administered 2018-02-24: 2.5 mg via ORAL
  Filled 2018-02-24: qty 1

## 2018-02-24 NOTE — Social Work (Addendum)
CSW sent referral to [facility] to review and see if they can offer SNF bed.  CSW f/u as patient appears to be medically stable to dc back to community if no SNF offers received.  CSW called brother and left message requesting a call back to discuss disposition.   CSW will continue to follow up.  Elissa Hefty, LCSW Clinical Social Worker [phone]

## 2018-02-24 NOTE — Progress Notes (Signed)
Nutrition Follow Up  DOCUMENTATION CODES:   Severe malnutrition in context of social or environmental circumstances, Underweight  INTERVENTION:    Continue Ensure Enlive po BID, each supplement provides 350 kcal and 20 grams of protein  NUTRITION DIAGNOSIS:   Severe Malnutrition related to social / environmental circumstances(cocaine use) as evidenced by severe muscle depletion, severe fat depletion, ongoing  GOAL:   Patient will meet greater than or equal to 90% of their needs, met  MONITOR:   PO intake, Supplement acceptance, Weight trends, Labs, Skin  ASSESSMENT:   57 y.o. 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 She arrived to the ED complaining of bilateral leg pain and trouble breathing which is been going on now for several days.  Chest x-ray noted signs of right sided pneumonia as well as soft tissue swelling with new subcutaneous gas seen in her right stump on x-ray.  Patient started on broad-spectrum IV antibiotics and fluid bolus. Patient also verbalized suicidal ideation saying that she wanted to kill herself and did not want to live this way although she did not specify a plan.  Pt s/p procedure 5/5: BILATERAL ABOVE KNEE AMPUTATION  PO intake excellent at 100% per flowsheet records. Drinking her Ensure Enlive nutrition supplements. Medications include Reglan, MVI and Colace. Disposition: possible SNF placement.  Diet Order:   Diet Order           Diet regular Room service appropriate? Yes; Fluid consistency: Thin  Diet effective now         EDUCATION NEEDS:   No education needs have been identified at this time  Skin:  Skin Assessment: Skin Integrity Issues: Skin Integrity Issues:: Wound VAC Wound Vac: bilateral >> removed 5/14  Last BM:  5/20  Height:   Ht Readings from Last 1 Encounters:  02/02/18 5'  1" (1.549 m)   Weight:   Wt Readings from Last 1 Encounters:  02/02/18 79 lb 12.9 oz (36.2 kg)   Ideal Body Weight:  47.27 kg  BMI:  Body mass index is 15.08 kg/m.  Estimated Nutritional Needs:   Kcal:  1300-1500  Protein:  60-75 gm  Fluid:  >/= 1.5 L  Arthur Holms, RD, LDN Pager #: [phone] After-Hours Pager #: [phone]

## 2018-02-24 NOTE — Progress Notes (Addendum)
PROGRESS NOTE    [name]  GXQ:[id number] DOB: [birth year] DOA: 02/02/2018 PCP: No primary care provider on file.    Brief Narrative:  Patient is a 57 y.o. female prior history of cocaine use, hypertension, bilateral BKA-admitted with shortness of breath, found to have pneumonia, she was also found to have bilateral BKA stump infection-she was evaluated by orthopedics and underwent bilateral AKA.  Awaiting SNF placement.   Assessment & Plan: Sepsis secondary to healthcare associated pneumonia and bilateral BKA stump infection with gangrene: Sepsis pathophysiology has resolved, underwent bilateral AKA-no longer on any antimicrobial therapy-Levaquin is discontinued on 5/12.  Blood cultures negative.  Awaiting SNF bed-per social work difficult to place per social work given prior history of drug use.    Bilateral BKA stump infection with gangrene: Status post bilateral AKA on 5/5-spoke with Dr. Sharol Given on 5/14-okay to remove a wound VAC and place a 4 x 4 dressing with Ace wraps.  Appears to have some phantom pain-with good control with Neurontin. Stump site looks benign. Spoke with Dr Sharol Given on 5/22-ok to remove staples therefore all sutures removed.  Mechanical fall: Patient fell from a wheelchair on 5/15, CT of the head and CT of the C-spine negative for abnormalities.  Small scalp hematoma in the occipital area appears to be stable.    Anemia: Multifactorial-probably secondary to acute illness-hemoglobin remained stable, has been transfused a total of 3 units of PRBC so far.  Hypertension: soft b/p overnight, Norvasc decreased with parameters written for administration   Polysubstance abuse/cocaine use: Counseled  Severe protein calorie malnutrition: Continue supplements   DVT prophylaxis: Prophylactic Lovenox Code Status: Full code Family Communication: None at bedside  Disposition Plan: Remains inpatient-family refused CIR, awaiting SNF placement    Consultants:   Orthopedic  surgery   Procedures:   5/5 - Bilateral AKA  Antimicrobials:  Anti-infectives (From admission, onward)   Start     Dose/Rate Route Frequency Ordered Stop   02/09/18 1000  levofloxacin (LEVAQUIN) tablet 500 mg    Note to Pharmacy:  Dr switching pt over to Levaquin 500 mg oral per note.   500 mg Oral Daily 02/08/18 1349 02/13/18 1033   02/08/18 1145  levofloxacin (LEVAQUIN) IVPB 500 mg  Status:  Discontinued     500 mg 100 mL/hr over 60 Minutes Intravenous Every 24 hours 02/08/18 1142 02/08/18 1351   02/08/18 0900  vancomycin (VANCOCIN) IVPB 1000 mg/200 mL premix  Status:  Discontinued     1,000 mg 200 mL/hr over 60 Minutes Intravenous Every 24 hours 02/07/18 1107 02/08/18 1142   02/07/18 0600  ceFEPIme (MAXIPIME) 1 g in sodium chloride 0.9 % 100 mL IVPB  Status:  Discontinued     1 g 200 mL/hr over 30 Minutes Intravenous Every 24 hours 02/06/18 1442 02/08/18 1142   02/06/18 0630  ceFAZolin (ANCEF) IVPB 2g/100 mL premix  Status:  Discontinued     2 g 200 mL/hr over 30 Minutes Intravenous To ShortStay Surgical 02/05/18 1035 02/06/18 0925   02/05/18 1015  ceFAZolin (ANCEF) IVPB 2g/100 mL premix     2 g 200 mL/hr over 30 Minutes Intravenous On call to O.R. 02/05/18 1011 02/06/18 0559   02/03/18 1800  vancomycin (VANCOCIN) 500 mg in sodium chloride 0.9 % 100 mL IVPB  Status:  Discontinued     500 mg 100 mL/hr over 60 Minutes Intravenous Every 24 hours 02/02/18 1847 02/02/18 1858   02/03/18 1000  vancomycin (VANCOCIN) IVPB 750 mg/150 ml premix  Status:  Discontinued     750 mg 150 mL/hr over 60 Minutes Intravenous Every 24 hours 02/02/18 1858 02/07/18 1107   02/02/18 2200  ceFEPIme (MAXIPIME) 1 g in sodium chloride 0.9 % 100 mL IVPB  Status:  Discontinued     1 g 200 mL/hr over 30 Minutes Intravenous Every 12 hours 02/02/18 1847 02/02/18 1858   02/02/18 2200  ceFEPIme (MAXIPIME) 1 g in sodium chloride 0.9 % 100 mL IVPB  Status:  Discontinued     1 g 200 mL/hr over 30 Minutes  Intravenous Every 8 hours 02/02/18 1858 02/06/18 1442   02/02/18 1845  ceFEPIme (MAXIPIME) 2 g in sodium chloride 0.9 % 100 mL IVPB  Status:  Discontinued     2 g 200 mL/hr over 30 Minutes Intravenous  Once 02/02/18 1830 02/02/18 1847   02/02/18 1845  vancomycin (VANCOCIN) IVPB 1000 mg/200 mL premix  Status:  Discontinued     1,000 mg 200 mL/hr over 60 Minutes Intravenous  Once 02/02/18 1830 02/02/18 1847   02/02/18 1500  vancomycin (VANCOCIN) IVPB 1000 mg/200 mL premix     1,000 mg 200 mL/hr over 60 Minutes Intravenous  Once 02/02/18 1445 02/02/18 1647   02/02/18 1500  piperacillin-tazobactam (ZOSYN) IVPB 3.375 g     3.375 g 100 mL/hr over 30 Minutes Intravenous  Once 02/02/18 1445 02/02/18 1546        Subjective: Patient resting in bed upon assessment, no acute events overnight and no new complaints this am.   Objective: Vitals:   02/23/18 1115 02/23/18 1240 02/23/18 2041 02/24/18 0430  BP: (!) 102/50 (!) 94/55 108/63 101/62  Pulse:  (!) 57 63 73  Resp:  14    Temp:  97.9 F (36.6 C) 97.8 F (36.6 C) 98.5 F (36.9 C)  TempSrc:  Oral Oral Oral  SpO2:  100% 100% 100%  Weight:      Height:        Intake/Output Summary (Last 24 hours) at 02/24/2018 1043 Last data filed at 02/24/2018 0900 Gross per 24 hour  Intake 240 ml  Output 350 ml  Net -110 ml   Filed Weights   02/02/18 1636  Weight: 36.2 kg (79 lb 12.9 oz)    Examination: BP 101/62 (BP Location: Left Arm)   Pulse 73   Temp 98.5 F (36.9 C) (Oral)   Resp 14   Ht 5' 1"  (1.549 m) Comment: Per pt  Wt 36.2 kg (79 lb 12.9 oz)   SpO2 100%   BMI 15.08 kg/m  Filed Weights   02/02/18 1636  Weight: 36.2 kg (79 lb 12.9 oz)   General exam: Appears calm and comfortable  Respiratory system: Clear to auscultation. Respiratory effort normal. Cardiovascular system: S1 & S2 heard, RRR. No JVD, murmurs, rubs, gallops or clicks. No pedal edema. Gastrointestinal system: Abdomen is nondistended, soft and nontender. No  organomegaly or masses felt. Normal bowel sounds heard. Central nervous system: Alert and oriented. No focal neurological deficits. Extremities: Symmetric 5 x 5 power. Skin: No rashes, lesions or ulcers Psychiatry: Judgement and insight appear normal. Mood & affect appropriate.    Data Reviewed: I have personally reviewed following labs and imaging studies  CBC: Recent Labs  Lab 02/21/18 0450  WBC 6.1  HGB 10.1*  HCT 31.4*  MCV 92.6  PLT 284   Basic Metabolic Panel: Recent Labs  Lab 02/19/18 0732 02/21/18 0450 02/23/18 0441  NA  --  140  --   K  --  4.9  --  CL  --  105  --   CO2  --  26  --   GLUCOSE  --  101*  --   BUN  --  15  --   CREATININE 0.70 0.80 0.69  CALCIUM  --  9.2  --    GFR: Estimated Creatinine Clearance: 45.9 mL/min (by C-G formula based on SCr of 0.69 mg/dL). Liver Function Tests: No results for input(s): AST, ALT, ALKPHOS, BILITOT, PROT, ALBUMIN in the last 168 hours. No results for input(s): LIPASE, AMYLASE in the last 168 hours. No results for input(s): AMMONIA in the last 168 hours. Coagulation Profile: No results for input(s): INR, PROTIME in the last 168 hours. Cardiac Enzymes: No results for input(s): CKTOTAL, CKMB, CKMBINDEX, TROPONINI in the last 168 hours. BNP (last 3 results) No results for input(s): PROBNP in the last 8760 hours. HbA1C: No results for input(s): HGBA1C in the last 72 hours. CBG: No results for input(s): GLUCAP in the last 168 hours. Lipid Profile: No results for input(s): CHOL, HDL, LDLCALC, TRIG, CHOLHDL, LDLDIRECT in the last 72 hours. Thyroid Function Tests: No results for input(s): TSH, T4TOTAL, FREET4, T3FREE, THYROIDAB in the last 72 hours. Anemia Panel: No results for input(s): VITAMINB12, FOLATE, FERRITIN, TIBC, IRON, RETICCTPCT in the last 72 hours. Urine analysis:    Component Value Date/Time   COLORURINE AMBER (A) 01/20/2018 1020   APPEARANCEUR HAZY (A) 01/20/2018 1020   LABSPEC 1.026 01/20/2018  1020   PHURINE 5.0 01/20/2018 1020   GLUCOSEU NEGATIVE 01/20/2018 1020   HGBUR LARGE (A) 01/20/2018 1020   BILIRUBINUR NEGATIVE 01/20/2018 1020   BILIRUBINUR small 04/29/2015 1533   KETONESUR NEGATIVE 01/20/2018 1020   PROTEINUR >=300 (A) 01/20/2018 1020   UROBILINOGEN 1.0 07/26/2017 1550   NITRITE NEGATIVE 01/20/2018 1020   LEUKOCYTESUR NEGATIVE 01/20/2018 1020   Sepsis Labs: @LABRCNTIP (procalcitonin:4,lacticidven:4)  )No results found for this or any previous visit (from the past 240 hour(s)).    Radiology Studies: No results found.   Scheduled Meds: . amLODipine  2.5 mg Oral Daily  . busPIRone  15 mg Oral TID  . docusate sodium  100 mg Oral BID  . enoxaparin (LOVENOX) injection  30 mg Subcutaneous Q24H  . escitalopram  20 mg Oral QHS  . feeding supplement (ENSURE ENLIVE)  237 mL Oral BID BM  . gabapentin  200 mg Oral BID  . multivitamin with minerals  1 tablet Oral Daily  . polyethylene glycol  17 g Oral Daily   Continuous Infusions: . methocarbamol (ROBAXIN)  IV       LOS: 22 days    Time spent:  25- minutes-Greater than 50% of this time was spent in counseling, explanation of diagnosis, planning of further management, and coordination of care.  Johnsie Cancel, NP [facility]   If 7PM-7AM, please contact night-coverage [email or web address] Password [facility] 02/24/2018, 10:22 AM   Attending MD: Patient was seen and examined and agree with the above assessment and plan Vital signs are stable-blood pressure soft-amlodipine adjusted to 5 mg Rest of her issues are stable-exam is benign We are still awaiting SNF bed at this point.  Nena Alexander MD

## 2018-02-25 LAB — CREATININE, SERUM
CREATININE: 0.68 mg/dL (ref 0.44–1.00)
GFR calc Af Amer: >60 mL/min (ref >60)

## 2018-02-25 NOTE — Progress Notes (Signed)
PT Cancellation Note  Patient Details Name: [name] MRN: [id number] DOB: 14-Oct-1960   Cancelled Treatment:    Reason Eval/Treat Not Completed: (P) Pain limiting ability to participate(Pt reports she is waiting on a pain pill and not willing to participate at this time.  She pleasantly declined and reports she has been performing HEP in between therapy sessions.  Will f/u per POC.  )   Raley Novicki Eli Hose 02/25/2018, 4:30 PM  Governor Rooks, PTA pager [phone]

## 2018-02-25 NOTE — Progress Notes (Addendum)
PROGRESS NOTE    [name]  OVF:[id number] DOB: [birth year] DOA: 02/02/2018 PCP: No primary care provider on file.    Brief Narrative:  Patient is a24 y.o.female prior history of cocaine use, hypertension, bilateral BKA-admitted with shortness of breath, found to have pneumonia, she was also found to have bilateral BKA stump infection-she was evaluated by orthopedics and underwent bilateral AKA. Per case management-continue to remain unable to obtain SNF bed, have attempt to contact brother in order to develop discharge plan.    Assessment & Plan: Sepsis secondary to healthcare associated pneumonia and bilateral BKA stump infection with gangrene:Sepsis pathophysiology has resolved, underwent bilateral AKA-no longer on any antimicrobial therapy-Levaquin is discontinued on 5/12. Blood cultures negative. Awaiting SNF bed-per social work difficult to place per social work given prior history of drug use, may consider discharge back home with brother.   Bilateral BKA stump infection with gangrene: Status post bilateral AKA on 5/5-spoke with Dr. Sharol Given on 5/14-okay to remove a wound VAC and place a 4 x 4 dressing with Ace wraps. Appears to have some phantom pain-with good control with Neurontin. Stump site looks benign. Spoke with Dr Sharol Given on 5/22-ok to remove staples therefore all sutures removed.  Mechanical fall: Patient fell from a wheelchair on 5/15, CT of the head and CT of the C-spine negative for abnormalities. Small scalp hematoma in the occipital area appears to be stable.   Anemia:Multifactorial-probably secondary to acute illness-hemoglobin remained stable, has been transfused a total of 3 units of PRBC so far.  Hypertension: soft b/p continued overnight, Norvasc discontinued  Polysubstance abuse/cocaine ACZ:[id number]  Severe protein calorie malnutrition: Continue supplements  DVT prophylaxis: Prophylactic Lovenox Code Status: Full code Family Communication: None at  bedside  Disposition Plan: Remains inpatient-family refused CIR, awaiting SNF placement vs home discharge with brother   Consultants:   Orthopedic surgery   Procedures:   02/06/18>>Bilateral AKA  Antimicrobials:  Anti-infectives (From admission, onward)   Start     Dose/Rate Route Frequency Ordered Stop   02/09/18 1000  levofloxacin (LEVAQUIN) tablet 500 mg    Note to Pharmacy:  Dr switching pt over to Levaquin 500 mg oral per note.   500 mg Oral Daily 02/08/18 1349 02/13/18 1033   02/08/18 1145  levofloxacin (LEVAQUIN) IVPB 500 mg  Status:  Discontinued     500 mg 100 mL/hr over 60 Minutes Intravenous Every 24 hours 02/08/18 1142 02/08/18 1351   02/08/18 0900  vancomycin (VANCOCIN) IVPB 1000 mg/200 mL premix  Status:  Discontinued     1,000 mg 200 mL/hr over 60 Minutes Intravenous Every 24 hours 02/07/18 1107 02/08/18 1142   02/07/18 0600  ceFEPIme (MAXIPIME) 1 g in sodium chloride 0.9 % 100 mL IVPB  Status:  Discontinued     1 g 200 mL/hr over 30 Minutes Intravenous Every 24 hours 02/06/18 1442 02/08/18 1142   02/06/18 0630  ceFAZolin (ANCEF) IVPB 2g/100 mL premix  Status:  Discontinued     2 g 200 mL/hr over 30 Minutes Intravenous To ShortStay Surgical 02/05/18 1035 02/06/18 0925   02/05/18 1015  ceFAZolin (ANCEF) IVPB 2g/100 mL premix     2 g 200 mL/hr over 30 Minutes Intravenous On call to O.R. 02/05/18 1011 02/06/18 0559   02/03/18 1800  vancomycin (VANCOCIN) 500 mg in sodium chloride 0.9 % 100 mL IVPB  Status:  Discontinued     500 mg 100 mL/hr over 60 Minutes Intravenous Every 24 hours 02/02/18 1847 02/02/18 1858   02/03/18 1000  vancomycin (VANCOCIN) IVPB 750 mg/150 ml premix  Status:  Discontinued     750 mg 150 mL/hr over 60 Minutes Intravenous Every 24 hours 02/02/18 1858 02/07/18 1107   02/02/18 2200  ceFEPIme (MAXIPIME) 1 g in sodium chloride 0.9 % 100 mL IVPB  Status:  Discontinued     1 g 200 mL/hr over 30 Minutes Intravenous Every 12 hours 02/02/18 1847  02/02/18 1858   02/02/18 2200  ceFEPIme (MAXIPIME) 1 g in sodium chloride 0.9 % 100 mL IVPB  Status:  Discontinued     1 g 200 mL/hr over 30 Minutes Intravenous Every 8 hours 02/02/18 1858 02/06/18 1442   02/02/18 1845  ceFEPIme (MAXIPIME) 2 g in sodium chloride 0.9 % 100 mL IVPB  Status:  Discontinued     2 g 200 mL/hr over 30 Minutes Intravenous  Once 02/02/18 1830 02/02/18 1847   02/02/18 1845  vancomycin (VANCOCIN) IVPB 1000 mg/200 mL premix  Status:  Discontinued     1,000 mg 200 mL/hr over 60 Minutes Intravenous  Once 02/02/18 1830 02/02/18 1847   02/02/18 1500  vancomycin (VANCOCIN) IVPB 1000 mg/200 mL premix     1,000 mg 200 mL/hr over 60 Minutes Intravenous  Once 02/02/18 1445 02/02/18 1647   02/02/18 1500  piperacillin-tazobactam (ZOSYN) IVPB 3.375 g     3.375 g 100 mL/hr over 30 Minutes Intravenous  Once 02/02/18 1445 02/02/18 1546          Subjective: Patient resting in bed upon assessment, no complaints of pain or fever overnight. Spoke with patient regarding discharge plans.   Objective: Vitals:   02/24/18 1418 02/24/18 1943 02/24/18 2053 02/25/18 0535  BP: 103/67 (!) 89/48 106/62 103/86  Pulse: 71 62 70 68  Resp: 16     Temp: 98.2 F (36.8 C) 98.5 F (36.9 C)  97.9 F (36.6 C)  TempSrc: Oral Oral  Oral  SpO2: 100% 100%    Weight:      Height:        Intake/Output Summary (Last 24 hours) at 02/25/2018 1009 Last data filed at 02/25/2018 0700 Gross per 24 hour  Intake 480 ml  Output 300 ml  Net 180 ml   Filed Weights   02/02/18 1636  Weight: 36.2 kg (79 lb 12.9 oz)    Examination: BP 103/86 (BP Location: Right Arm)   Pulse 68   Temp 97.9 F (36.6 C) (Oral)   Resp 16   Ht 5' 1"  (1.549 m) Comment: Per pt  Wt 36.2 kg (79 lb 12.9 oz)   SpO2 100%   BMI 15.08 kg/m  General exam: Appears calm and comfortable  Respiratory system: Clear to auscultation. Respiratory effort normal. Cardiovascular system: S1 & S2 heard, RRR. No JVD, murmurs, rubs,  gallops or clicks. No pedal edema. Gastrointestinal system: Abdomen is nondistended, soft and nontender. No organomegaly or masses felt. Normal bowel sounds heard. Central nervous system: Alert and oriented. No focal neurological deficits. Extremities: Bilateral AKA with stumps wrapped Skin: No rashes, lesions or ulcers Psychiatry: Judgement and insight appear normal. Mood & affect appropriate.    Data Reviewed: I have personally reviewed following labs and imaging studies  CBC: Recent Labs  Lab 02/21/18 0450  WBC 6.1  HGB 10.1*  HCT 31.4*  MCV 92.6  PLT 578   Basic Metabolic Panel: Recent Labs  Lab 02/19/18 0732 02/21/18 0450 02/23/18 0441 02/25/18 0624  NA  --  140  --   --   K  --  4.9  --   --  CL  --  105  --   --   CO2  --  26  --   --   GLUCOSE  --  101*  --   --   BUN  --  15  --   --   CREATININE 0.70 0.80 0.69 0.68  CALCIUM  --  9.2  --   --    GFR: Estimated Creatinine Clearance: 45.9 mL/min (by C-G formula based on SCr of 0.68 mg/dL). Liver Function Tests: No results for input(s): AST, ALT, ALKPHOS, BILITOT, PROT, ALBUMIN in the last 168 hours. No results for input(s): LIPASE, AMYLASE in the last 168 hours. No results for input(s): AMMONIA in the last 168 hours. Coagulation Profile: No results for input(s): INR, PROTIME in the last 168 hours. Cardiac Enzymes: No results for input(s): CKTOTAL, CKMB, CKMBINDEX, TROPONINI in the last 168 hours. BNP (last 3 results) No results for input(s): PROBNP in the last 8760 hours. HbA1C: No results for input(s): HGBA1C in the last 72 hours. CBG: No results for input(s): GLUCAP in the last 168 hours. Lipid Profile: No results for input(s): CHOL, HDL, LDLCALC, TRIG, CHOLHDL, LDLDIRECT in the last 72 hours. Thyroid Function Tests: No results for input(s): TSH, T4TOTAL, FREET4, T3FREE, THYROIDAB in the last 72 hours. Anemia Panel: No results for input(s): VITAMINB12, FOLATE, FERRITIN, TIBC, IRON, RETICCTPCT in the  last 72 hours. Urine analysis:    Component Value Date/Time   COLORURINE AMBER (A) 01/20/2018 1020   APPEARANCEUR HAZY (A) 01/20/2018 1020   LABSPEC 1.026 01/20/2018 1020   PHURINE 5.0 01/20/2018 1020   GLUCOSEU NEGATIVE 01/20/2018 1020   HGBUR LARGE (A) 01/20/2018 1020   BILIRUBINUR NEGATIVE 01/20/2018 1020   BILIRUBINUR small 04/29/2015 1533   KETONESUR NEGATIVE 01/20/2018 1020   PROTEINUR >=300 (A) 01/20/2018 1020   UROBILINOGEN 1.0 07/26/2017 1550   NITRITE NEGATIVE 01/20/2018 1020   LEUKOCYTESUR NEGATIVE 01/20/2018 1020   Sepsis Labs: @LABRCNTIP (procalcitonin:4,lacticidven:4)  )No results found for this or any previous visit (from the past 240 hour(s)).       Radiology Studies: No results found.      Scheduled Meds: . busPIRone  15 mg Oral TID  . docusate sodium  100 mg Oral BID  . enoxaparin (LOVENOX) injection  30 mg Subcutaneous Q24H  . escitalopram  20 mg Oral QHS  . feeding supplement (ENSURE ENLIVE)  237 mL Oral BID BM  . gabapentin  200 mg Oral BID  . multivitamin with minerals  1 tablet Oral Daily  . polyethylene glycol  17 g Oral Daily   Continuous Infusions: . methocarbamol (ROBAXIN)  IV       LOS: 23 days    Time spent:  25- minutes-Greater than 50% of this time was spent in counseling, explanation of diagnosis, planning of further management, and coordination of care.    Johnsie Cancel, NP [facility]  If 7PM-7AM, please contact night-coverage [email or web address] Password [facility] 02/25/2018, 10:09 AM    Attending note Seen and examined-agree with above. Awaiting SNF placement-social worker following. Has been stable for the past few days  Nena Alexander MD

## 2018-02-26 IMAGING — CR DG CHEST 2V
2 series · 2 of 2 positions shown · non-contrast
Comparison: Multiple x-rays since September 07, 2015

CLINICAL DATA: Pain.

EXAM:
CHEST  2 VIEW

[chest lat]
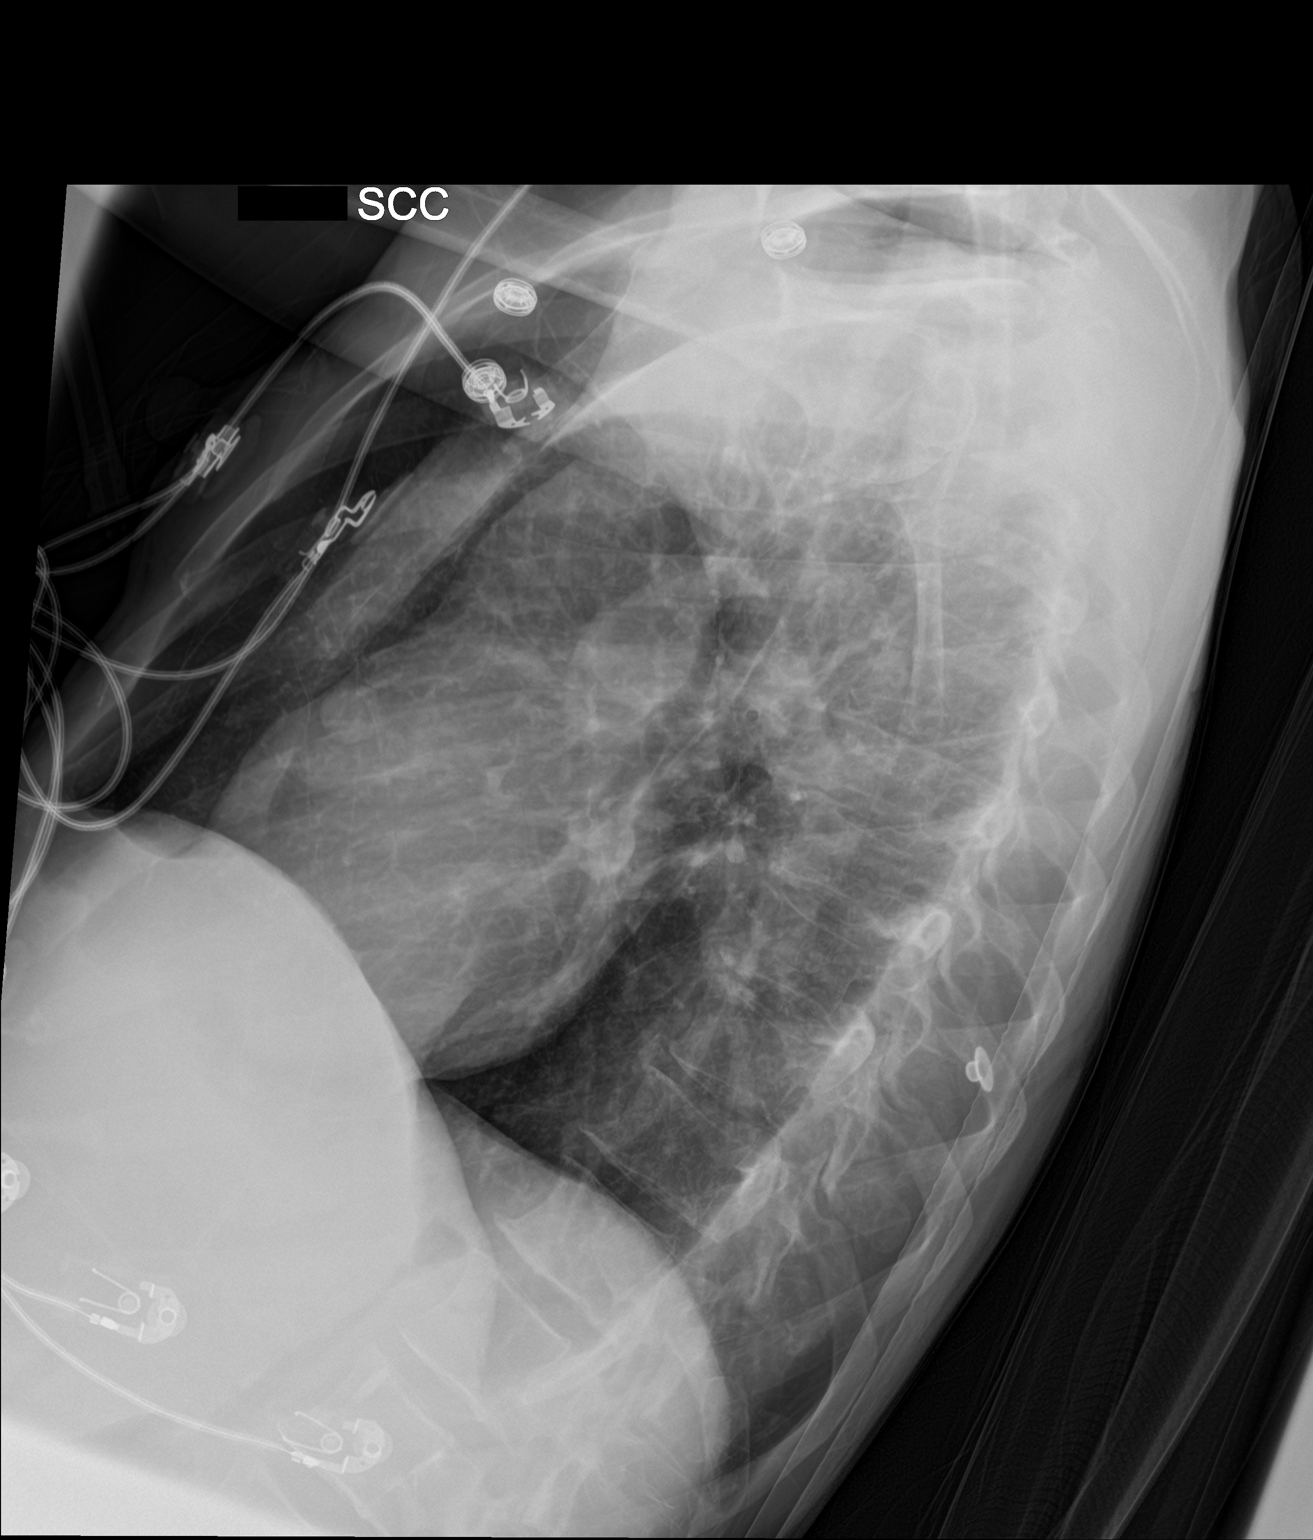

[chest ap]
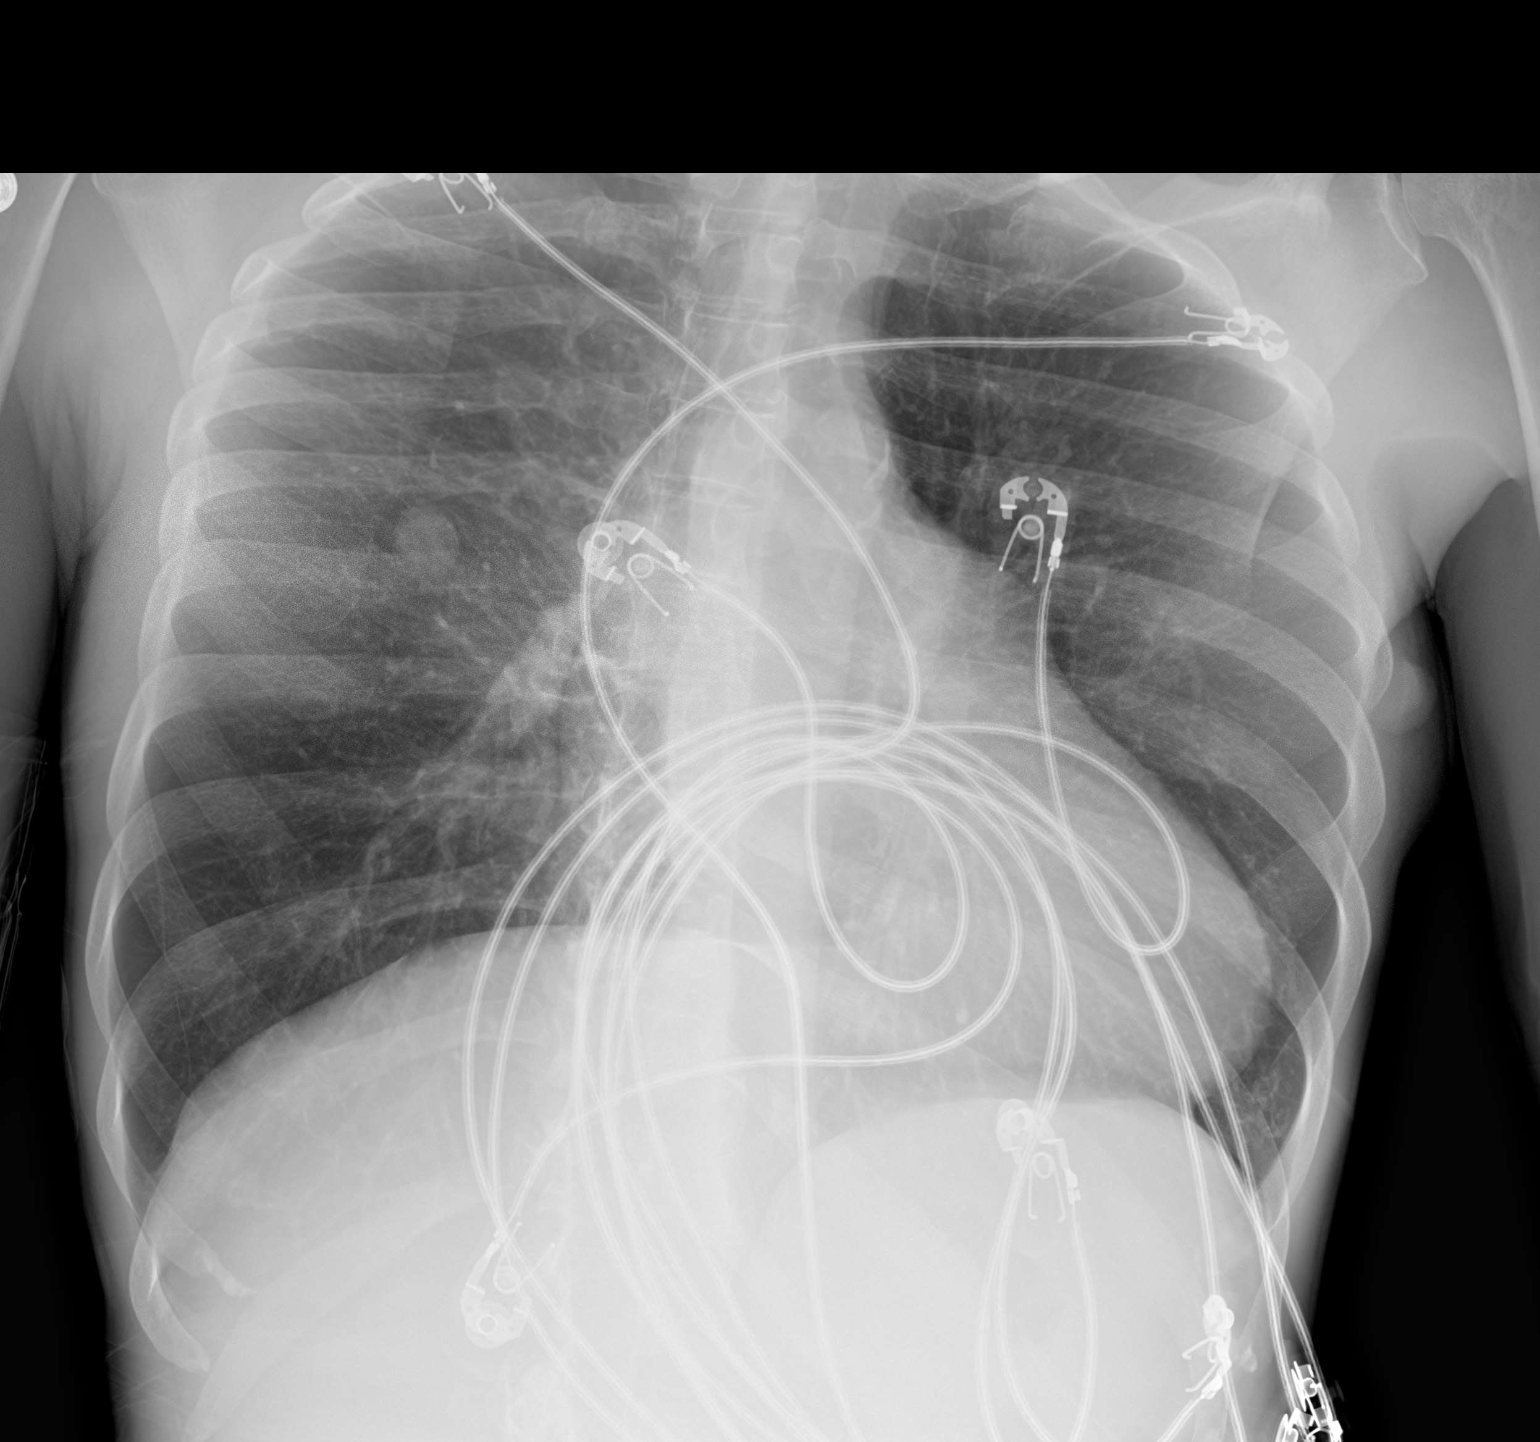

[2 of 2 positions shown; findings below may reference images not displayed]

FINDINGS: Prominent nipple shadows are again seen. The heart, hila,
mediastinum, lungs, and pleura are otherwise normal.
IMPRESSION: No active cardiopulmonary disease.

## 2018-02-26 MED ORDER — BUSPIRONE HCL 5 MG PO TABS
15.0000 mg | ORAL_TABLET | Freq: Three times a day (TID) | ORAL | Status: DC
  Administered 2018-02-26 – 2018-03-02 (×12): 15 mg via ORAL
  Filled 2018-02-26 (×12): qty 3

## 2018-02-26 NOTE — Progress Notes (Signed)
PROGRESS NOTE        PATIENT DETAILS Name: [name] Age: 57 y.o. Sex: female Date of Birth: 02-May-1961 Admit Date: 02/02/2018 Admitting Physician Annita Brod, MD PCP:No primary care provider on file.  Brief Narrative: Patient is a 57 y.o. female prior history of cocaine use, hypertension, bilateral BKA-admitted with shortness of breath, found to have pneumonia, she was also found to have bilateral BKA stump infection-she was evaluated by orthopedics and underwent bilateral AKA.  Awaiting SNF placement.  Subjective: No major events overnight.  Lying comfortably in bed.  Denies any chest pain or shortness of breath.   Assessment/Plan: Sepsis secondary to healthcare associated pneumonia and bilateral BKA stump infection with gangrene: Sepsis pathophysiology has resolved, underwent bilateral AKA-no longer on any antimicrobial therapy-Levaquin is discontinued on 5/12.  Blood cultures negative.  Awaiting SNF bed-per social work difficult toplace per social work given prior history of drug use.    Bilateral BKA stump infection with gangrene: Status post bilateral AKA on 5/5-spoke with Dr. Sharol Given on 5/14-okay to remove a wound VAC and place a 4 x 4 dressing with Ace wraps.  Appears to have some phantom pain-with good control with Neurontin. Stump site looks benign.Spoke with Dr Sharol Given on 5/22-ok to remove staples.   Mechanical fall: Patient fell from a wheelchair on 5/15, CT of the head and CT of the C-spine negative for abnormalities.  Small scalp hematoma in the occipital area appears to be stable.    Anemia: Multifactorial-probably secondary to acute illness-hemoglobin remained stable, has been transfused a total of 3 units of PRBC so far.  Hypertension: Controlled-no longer on  Norvasc  Polysubstance abuse/cocaine use: Counseled  Severe protein calorie malnutrition: Continue supplements  DVT Prophylaxis: Prophylactic Lovenox   Code Status: Full code    Family Communication: None at bedside  Disposition Plan: Remain inpatient-family refused CIR-awaiting SNF placement.  Antimicrobial agents: Anti-infectives (From admission, onward)   Start     Dose/Rate Route Frequency Ordered Stop   02/09/18 1000  levofloxacin (LEVAQUIN) tablet 500 mg    Note to Pharmacy:  Dr switching pt over to Levaquin 500 mg oral per note.   500 mg Oral Daily 02/08/18 1349 02/13/18 1033   02/08/18 1145  levofloxacin (LEVAQUIN) IVPB 500 mg  Status:  Discontinued     500 mg 100 mL/hr over 60 Minutes Intravenous Every 24 hours 02/08/18 1142 02/08/18 1351   02/08/18 0900  vancomycin (VANCOCIN) IVPB 1000 mg/200 mL premix  Status:  Discontinued     1,000 mg 200 mL/hr over 60 Minutes Intravenous Every 24 hours 02/07/18 1107 02/08/18 1142   02/07/18 0600  ceFEPIme (MAXIPIME) 1 g in sodium chloride 0.9 % 100 mL IVPB  Status:  Discontinued     1 g 200 mL/hr over 30 Minutes Intravenous Every 24 hours 02/06/18 1442 02/08/18 1142   02/06/18 0630  ceFAZolin (ANCEF) IVPB 2g/100 mL premix  Status:  Discontinued     2 g 200 mL/hr over 30 Minutes Intravenous To [facility] 02/05/18 1035 02/06/18 0925   02/05/18 1015  ceFAZolin (ANCEF) IVPB 2g/100 mL premix     2 g 200 mL/hr over 30 Minutes Intravenous On call to O.R. 02/05/18 1011 02/06/18 0559   02/03/18 1800  vancomycin (VANCOCIN) 500 mg in sodium chloride 0.9 % 100 mL IVPB  Status:  Discontinued  500 mg 100 mL/hr over 60 Minutes Intravenous Every 24 hours 02/02/18 1847 02/02/18 1858   02/03/18 1000  vancomycin (VANCOCIN) IVPB 750 mg/150 ml premix  Status:  Discontinued     750 mg 150 mL/hr over 60 Minutes Intravenous Every 24 hours 02/02/18 1858 02/07/18 1107   02/02/18 2200  ceFEPIme (MAXIPIME) 1 g in sodium chloride 0.9 % 100 mL IVPB  Status:  Discontinued     1 g 200 mL/hr over 30 Minutes Intravenous Every 12 hours 02/02/18 1847 02/02/18 1858   02/02/18 2200  ceFEPIme (MAXIPIME) 1 g in sodium chloride 0.9 %  100 mL IVPB  Status:  Discontinued     1 g 200 mL/hr over 30 Minutes Intravenous Every 8 hours 02/02/18 1858 02/06/18 1442   02/02/18 1845  ceFEPIme (MAXIPIME) 2 g in sodium chloride 0.9 % 100 mL IVPB  Status:  Discontinued     2 g 200 mL/hr over 30 Minutes Intravenous  Once 02/02/18 1830 02/02/18 1847   02/02/18 1845  vancomycin (VANCOCIN) IVPB 1000 mg/200 mL premix  Status:  Discontinued     1,000 mg 200 mL/hr over 60 Minutes Intravenous  Once 02/02/18 1830 02/02/18 1847   02/02/18 1500  vancomycin (VANCOCIN) IVPB 1000 mg/200 mL premix     1,000 mg 200 mL/hr over 60 Minutes Intravenous  Once 02/02/18 1445 02/02/18 1647   02/02/18 1500  piperacillin-tazobactam (ZOSYN) IVPB 3.375 g     3.375 g 100 mL/hr over 30 Minutes Intravenous  Once 02/02/18 1445 02/02/18 1546      Procedures: 5/5>> bilateral AKA  CONSULTS:  orthopedic surgery  Time spent: 25- minutes-Greater than 50% of this time was spent in counseling, explanation of diagnosis, planning of further management, and coordination of care.  MEDICATIONS: Scheduled Meds: . busPIRone  15 mg Oral TID  . docusate sodium  100 mg Oral BID  . enoxaparin (LOVENOX) injection  30 mg Subcutaneous Q24H  . escitalopram  20 mg Oral QHS  . feeding supplement (ENSURE ENLIVE)  237 mL Oral BID BM  . gabapentin  200 mg Oral BID  . multivitamin with minerals  1 tablet Oral Daily  . polyethylene glycol  17 g Oral Daily   Continuous Infusions: . methocarbamol (ROBAXIN)  IV     PRN Meds:.acetaminophen, alum & mag hydroxide-simeth, bisacodyl, diphenhydrAMINE, HYDROmorphone (DILAUDID) injection, ibuprofen, ipratropium-albuterol, magnesium citrate, methocarbamol **OR** methocarbamol (ROBAXIN)  IV, metoCLOPramide **OR** metoCLOPramide (REGLAN) injection, morphine injection, ondansetron **OR** ondansetron (ZOFRAN) IV, oxyCODONE, oxyCODONE, zolpidem   PHYSICAL EXAM: Vital signs: Vitals:   02/25/18 1236 02/25/18 2054 02/26/18 0624 02/26/18 1207    BP: (!) 93/50 (!) 94/58 (!) 129/52 107/71  Pulse: 62 64 (!) 56 67  Resp: 20   20  Temp: 98.2 F (36.8 C) 98.3 F (36.8 C) 98.3 F (36.8 C) 98.6 F (37 C)  TempSrc: Oral Oral Oral Oral  SpO2: 100% 100% 96% 96%  Weight:      Height:       Filed Weights   02/02/18 1636  Weight: 36.2 kg (79 lb 12.9 oz)   Body mass index is 15.08 kg/m.   General appearance:Awake, alert, not in any distress.  Eyes:no scleral icterus. HEENT: Atraumatic and Normocephalic Neck: supple, no JVD. Resp:Good air entry bilaterally,no rales or rhonchi CVS: S1 S2 regular, no murmurs.  GI: Bowel sounds present, Non tender and not distended with no gaurding, rigidity or rebound. Extremities: B/L AKA--no erythema or swelling or discharge evident Neurology:  Non focal Psychiatric: Normal judgment  and insight. Normal mood. Musculoskeletal:No digital cyanosis Skin:No Rash, warm and dry Wounds:N/A  I have personally reviewed following labs and imaging studies  LABORATORY DATA: CBC: Recent Labs  Lab 02/21/18 0450  WBC 6.1  HGB 10.1*  HCT 31.4*  MCV 92.6  PLT 109    Basic Metabolic Panel: Recent Labs  Lab 02/21/18 0450 02/23/18 0441 02/25/18 0624  NA 140  --   --   K 4.9  --   --   CL 105  --   --   CO2 26  --   --   GLUCOSE 101*  --   --   BUN 15  --   --   CREATININE 0.80 0.69 0.68  CALCIUM 9.2  --   --     GFR: Estimated Creatinine Clearance: 45.9 mL/min (by C-G formula based on SCr of 0.68 mg/dL).  Liver Function Tests: No results for input(s): AST, ALT, ALKPHOS, BILITOT, PROT, ALBUMIN in the last 168 hours. No results for input(s): LIPASE, AMYLASE in the last 168 hours. No results for input(s): AMMONIA in the last 168 hours.  Coagulation Profile: No results for input(s): INR, PROTIME in the last 168 hours.  Cardiac Enzymes: No results for input(s): CKTOTAL, CKMB, CKMBINDEX, TROPONINI in the last 168 hours.  BNP (last 3 results) No results for input(s): PROBNP in the last  8760 hours.  HbA1C: No results for input(s): HGBA1C in the last 72 hours.  CBG: No results for input(s): GLUCAP in the last 168 hours.  Lipid Profile: No results for input(s): CHOL, HDL, LDLCALC, TRIG, CHOLHDL, LDLDIRECT in the last 72 hours.  Thyroid Function Tests: No results for input(s): TSH, T4TOTAL, FREET4, T3FREE, THYROIDAB in the last 72 hours.  Anemia Panel: No results for input(s): VITAMINB12, FOLATE, FERRITIN, TIBC, IRON, RETICCTPCT in the last 72 hours.  Urine analysis:    Component Value Date/Time   COLORURINE AMBER (A) 01/20/2018 1020   APPEARANCEUR HAZY (A) 01/20/2018 1020   LABSPEC 1.026 01/20/2018 1020   PHURINE 5.0 01/20/2018 1020   GLUCOSEU NEGATIVE 01/20/2018 1020   HGBUR LARGE (A) 01/20/2018 1020   BILIRUBINUR NEGATIVE 01/20/2018 1020   BILIRUBINUR small 04/29/2015 1533   KETONESUR NEGATIVE 01/20/2018 1020   PROTEINUR >=300 (A) 01/20/2018 1020   UROBILINOGEN 1.0 07/26/2017 1550   NITRITE NEGATIVE 01/20/2018 1020   LEUKOCYTESUR NEGATIVE 01/20/2018 1020    Sepsis Labs: Lactic Acid, Venous    Component Value Date/Time   LATICACIDVEN 1.1 02/03/2018 0013    MICROBIOLOGY: No results found for this or any previous visit (from the past 240 hour(s)).  RADIOLOGY STUDIES/RESULTS: Dg Chest 2 View  Addendum Date: 02/02/2018   ADDENDUM REPORT: 02/02/2018 14:33 ADDENDUM: Acute findings discussed with and reconfirmed by Dr.KEVIN CAMPOS on 02/02/2018 at 2:32 pm. Electronically Signed   By: Elon Alas M.D.   On: 02/02/2018 14:33   Result Date: 02/02/2018 CLINICAL DATA:  Mid chest pain, cough and congestion. EXAM: CHEST - 2 VIEW COMPARISON:  Chest radiograph January 20, 2018 FINDINGS: Multifocal RIGHT lung consolidation. Mild chronic interstitial changes without pleural effusion. Borderline cardiomegaly. Calcified aortic knob. No pneumothorax. Soft tissue planes included osseous structures are nonsuspicious. IMPRESSION: 1. Multifocal RIGHT pneumonia. 2. Borderline  cardiomegaly. 3.  Aortic Atherosclerosis (ICD10-I70.0). Electronically Signed: By: Elon Alas M.D. On: 02/02/2018 14:28   Dg Knee 1-2 Views Left  Result Date: 02/02/2018 CLINICAL DATA:  Knee pain.  Status post BKA. EXAM: LEFT KNEE - 1-2 VIEW COMPARISON:  01/20/2018 FINDINGS: Bones are demineralized. No  evidence for an acute fracture. No subluxation or dislocation. No worrisome lytic or sclerotic osseous abnormality. No gross joint effusion evident. There is some soft tissue irregularity at the stump. IMPRESSION: Osteopenia without acute bony abnormality. Soft tissue irregularity at the stump may be postoperative scarring although wound/ulcer could have this appearance. Electronically Signed   By: Misty Stanley M.D.   On: 02/02/2018 14:30   Dg Knee 1-2 Views Right  Result Date: 02/02/2018 CLINICAL DATA:  Pain and necrosis at stump, bleeding. EXAM: RIGHT KNEE - 1-2 VIEW COMPARISON:  RIGHT knee radiograph January 20, 2018 FINDINGS: Status post below-knee amputation. No acute osseous process. No rib fracture deformity or dislocation. No advanced degenerative change for age. Increasing soft tissue swelling with superficial irregularity and subcutaneous gas about the stump. IMPRESSION: Soft tissue swelling with new subcutaneous gas seen with necrotizing fasciitis or recent instrumentation. No acute osseous process. Acute findings discussed with and reconfirmed by Dr.KEVIN CAMPOS on 02/02/2018 at 2:32 pm. Electronically Signed   By: Elon Alas M.D.   On: 02/02/2018 14:32   Ct Head Wo Contrast  Result Date: 02/16/2018 CLINICAL DATA:  Pain following fall EXAM: CT HEAD WITHOUT CONTRAST CT CERVICAL SPINE WITHOUT CONTRAST TECHNIQUE: Multidetector CT imaging of the head and cervical spine was performed following the standard protocol without intravenous contrast. Multiplanar CT image reconstructions of the cervical spine were also generated. COMPARISON:  Head CT June 08, 2009 FINDINGS: CT HEAD FINDINGS  Brain: The ventricles are normal in size and configuration. There is no intracranial mass, hemorrhage, extra-axial fluid collection, or midline shift. Gray-white compartments appear normal. No evident acute infarct. Vascular: There is no hyperdense vessel. There is calcification in each carotid siphon region. Skull: Bony calvarium appears intact. There is a right parietal scalp hematoma. Sinuses/Orbits: There is mucosal thickening in several ethmoid air cells. Other visualized paranasal sinuses are clear. Visualized orbits appear symmetric bilaterally. Note that there is increased attenuation in each globe which potentially may be artifactual. Other: Mastoid air cells are clear. CT CERVICAL SPINE FINDINGS Alignment: There is no appreciable spondylolisthesis. Skull base and vertebrae: Skull base and craniocervical junction regions appear normal. No evident fracture. No blastic or lytic bone lesions. Soft tissues and spinal canal: Prevertebral soft tissues and predental space regions are normal. No paraspinous lesions evident. No cord canal hematoma appreciable. Disc levels: There is moderate disc space narrowing at C5-6 and C6-7. There are anterior osteophytes at C5 and C6. There is a calcified central and left paracentral disc protrusion at C6-7 with mild impression on the ventral cord at this level. There is mild facet hypertrophy at several levels. Upper chest: Visualized upper lung zones are clear. There is a degree of underlying centrilobular emphysematous change. Other: There are foci of calcification in each carotid and subclavian artery. IMPRESSION: CT head: 1. No mass or hemorrhage. Gray-white compartments appear normal. Note that there is a right parietal scalp hematoma. No fracture evident. 2.  Foci of arterial vascular calcification noted. 3.  Mucosal thickening in several ethmoid air cells. 4. Increased attenuation in both globes may well be artifactual. If patient is reporting vision difficulty,  ophthalmological examination in this regard may be reasonable. CT cervical spine: No fracture or spondylolisthesis. Arthropathy at several levels with a calcified central and left paracentral disc protrusion at C6-7. No high-grade stenosis. Foci of calcification in subclavian and carotid arteries noted. Underlying centrilobular emphysematous change. Emphysema (ICD10-J43.9). Electronically Signed   By: Lowella Grip III M.D.   On: 02/16/2018 13:18  Ct Cervical Spine Wo Contrast  Result Date: 02/16/2018 CLINICAL DATA:  Pain following fall EXAM: CT HEAD WITHOUT CONTRAST CT CERVICAL SPINE WITHOUT CONTRAST TECHNIQUE: Multidetector CT imaging of the head and cervical spine was performed following the standard protocol without intravenous contrast. Multiplanar CT image reconstructions of the cervical spine were also generated. COMPARISON:  Head CT June 08, 2009 FINDINGS: CT HEAD FINDINGS Brain: The ventricles are normal in size and configuration. There is no intracranial mass, hemorrhage, extra-axial fluid collection, or midline shift. Gray-white compartments appear normal. No evident acute infarct. Vascular: There is no hyperdense vessel. There is calcification in each carotid siphon region. Skull: Bony calvarium appears intact. There is a right parietal scalp hematoma. Sinuses/Orbits: There is mucosal thickening in several ethmoid air cells. Other visualized paranasal sinuses are clear. Visualized orbits appear symmetric bilaterally. Note that there is increased attenuation in each globe which potentially may be artifactual. Other: Mastoid air cells are clear. CT CERVICAL SPINE FINDINGS Alignment: There is no appreciable spondylolisthesis. Skull base and vertebrae: Skull base and craniocervical junction regions appear normal. No evident fracture. No blastic or lytic bone lesions. Soft tissues and spinal canal: Prevertebral soft tissues and predental space regions are normal. No paraspinous lesions evident.  No cord canal hematoma appreciable. Disc levels: There is moderate disc space narrowing at C5-6 and C6-7. There are anterior osteophytes at C5 and C6. There is a calcified central and left paracentral disc protrusion at C6-7 with mild impression on the ventral cord at this level. There is mild facet hypertrophy at several levels. Upper chest: Visualized upper lung zones are clear. There is a degree of underlying centrilobular emphysematous change. Other: There are foci of calcification in each carotid and subclavian artery. IMPRESSION: CT head: 1. No mass or hemorrhage. Gray-white compartments appear normal. Note that there is a right parietal scalp hematoma. No fracture evident. 2.  Foci of arterial vascular calcification noted. 3.  Mucosal thickening in several ethmoid air cells. 4. Increased attenuation in both globes may well be artifactual. If patient is reporting vision difficulty, ophthalmological examination in this regard may be reasonable. CT cervical spine: No fracture or spondylolisthesis. Arthropathy at several levels with a calcified central and left paracentral disc protrusion at C6-7. No high-grade stenosis. Foci of calcification in subclavian and carotid arteries noted. Underlying centrilobular emphysematous change. Emphysema (ICD10-J43.9). Electronically Signed   By: Lowella Grip III M.D.   On: 02/16/2018 13:18     LOS: 24 days   Oren Binet, MD  [facility] Pager:[phone]  If 7PM-7AM, please contact night-coverage [email or web address] Password [email or web address] 02/26/2018, 4:00 PM

## 2018-02-26 NOTE — Plan of Care (Signed)
  Problem: Skin Integrity: Goal: Risk for impaired skin integrity will decrease Outcome: Progressing   Problem: Activity: Goal: Ability to perform//tolerate increased activity and mobilize with assistive devices will improve Outcome: Progressing   Problem: Self-Care: Goal: Ability to meet self-care needs will improve Outcome: Progressing   Problem: Self-Concept: Goal: Ability to maintain and perform role responsibilities to the fullest extent possible will improve Outcome: Progressing   Problem: Skin Integrity: Goal: Demonstration of wound healing without infection will improve Outcome: Progressing

## 2018-02-27 ENCOUNTER — Encounter (HOSPITAL_COMMUNITY): Payer: Self-pay

## 2018-02-27 LAB — BASIC METABOLIC PANEL
Anion gap: 10 (ref 5–15)
BUN: 15 mg/dL (ref 6–20)
CHLORIDE: 106 mmol/L (ref 101–111)
CO2: 21 mmol/L — ABNORMAL LOW (ref 22–32)
CREATININE: 0.75 mg/dL (ref 0.44–1.00)
Calcium: 9.1 mg/dL (ref 8.9–10.3)
GFR calc Af Amer: >60 mL/min (ref >60)
GFR calc non Af Amer: >60 mL/min (ref >60)
GLUCOSE: 79 mg/dL (ref 65–99)
Potassium: 4.5 mmol/L (ref 3.5–5.1)
Sodium: 137 mmol/L (ref 135–145)

## 2018-02-27 LAB — CBC
HCT: 31.7 % — ABNORMAL LOW (ref 36.0–46.0)
Hemoglobin: 10.4 g/dL — ABNORMAL LOW (ref 12.0–15.0)
MCH: 30.5 pg (ref 26.0–34.0)
MCHC: 32.8 g/dL (ref 30.0–36.0)
MCV: 93 fL (ref 78.0–100.0)
Platelets: 216 K/uL (ref 150–400)
RBC: 3.41 MIL/uL — ABNORMAL LOW (ref 3.87–5.11)
RDW: 15.5 % (ref 11.5–15.5)
WBC: 4.8 K/uL (ref 4.0–10.5)

## 2018-02-27 LAB — CREATININE, SERUM
Creatinine, Ser: 0.78 mg/dL (ref 0.44–1.00)
GFR calc Af Amer: >60 mL/min (ref >60)
GFR calc non Af Amer: >60 mL/min (ref >60)

## 2018-02-27 NOTE — Progress Notes (Signed)
PROGRESS NOTE        PATIENT DETAILS Name: [name] Age: 57 y.o. Sex: female Date of Birth: [birth year] Admit Date: 02/02/2018 Admitting Physician Annita Brod, MD PCP:No primary care provider on file.  Brief Narrative: Patient is a 57 y.o. female prior history of cocaine use, hypertension, bilateral BKA-admitted with shortness of breath, found to have pneumonia, she was also found to have bilateral BKA stump infection-she was evaluated by orthopedics and underwent bilateral AKA.  Awaiting SNF placement.  Subjective:  Patient in bed, appears comfortable, denies any headache, no fever, no chest pain or pressure, no shortness of breath , no abdominal pain. No focal weakness.    Assessment/Plan: Sepsis secondary to healthcare associated pneumonia and bilateral BKA stump infection with gangrene: Sepsis pathophysiology has resolved, underwent bilateral AKA-finished on antibiotic treatment on 02/13/2018.  Cultures negative.  She is medically stable but awaits SNF bed for placement.  Bilateral BKA stump infection with gangrene: Status post bilateral AKA on 5/5-previous MD had spoken with Dr. Sharol Given on 5/14-okay to remove a wound VAC and place a 4 x 4 dressing with ACE wraps, also okay to remove staples which was done on 02/23/2018.  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  Mechanical fall: Patient fell from a wheelchair on 5/15, CT of the head and CT of the C-spine negative for abnormalities.  Small scalp hematoma in the occipital area appears to be stable.    Anemia: Multifactorial-probably secondary to acute illness-hemoglobin remained stable, has been transfused a total of 3 units of PRBC so far.  Currently H&H stable.  Hypertension: Controlled-continue on no medications, Norvasc on hold.  Polysubstance abuse/cocaine use: Counseled.  Severe protein calorie malnutrition: Continue supplements.    DVT Prophylaxis: Prophylactic  Lovenox   Code Status: Full code   Family Communication: None at bedside  Disposition Plan: Remain inpatient-family refused CIR-awaiting SNF placement.  Antimicrobial agents: Anti-infectives (From admission, onward)   Start     Dose/Rate Route Frequency Ordered Stop   02/09/18 1000  levofloxacin (LEVAQUIN) tablet 500 mg    Note to Pharmacy:  Dr switching pt over to Levaquin 500 mg oral per note.   500 mg Oral Daily 02/08/18 1349 02/13/18 1033   02/08/18 1145  levofloxacin (LEVAQUIN) IVPB 500 mg  Status:  Discontinued     500 mg 100 mL/hr over 60 Minutes Intravenous Every 24 hours 02/08/18 1142 02/08/18 1351   02/08/18 0900  vancomycin (VANCOCIN) IVPB 1000 mg/200 mL premix  Status:  Discontinued     1,000 mg 200 mL/hr over 60 Minutes Intravenous Every 24 hours 02/07/18 1107 02/08/18 1142   02/07/18 0600  ceFEPIme (MAXIPIME) 1 g in sodium chloride 0.9 % 100 mL IVPB  Status:  Discontinued     1 g 200 mL/hr over 30 Minutes Intravenous Every 24 hours 02/06/18 1442 02/08/18 1142   02/06/18 0630  ceFAZolin (ANCEF) IVPB 2g/100 mL premix  Status:  Discontinued     2 g 200 mL/hr over 30 Minutes Intravenous To [facility] 02/05/18 1035 02/06/18 0925   02/05/18 1015  ceFAZolin (ANCEF) IVPB 2g/100 mL premix     2 g 200 mL/hr over 30 Minutes Intravenous On call to O.R. 02/05/18 1011 02/06/18 0559   02/03/18 1800  vancomycin (VANCOCIN) 500 mg in sodium chloride 0.9 % 100 mL IVPB  Status:  Discontinued     500 mg 100 mL/hr over 60 Minutes Intravenous Every 24 hours 02/02/18 1847 02/02/18 1858   02/03/18 1000  vancomycin (VANCOCIN) IVPB 750 mg/150 ml premix  Status:  Discontinued     750 mg 150 mL/hr over 60 Minutes Intravenous Every 24 hours 02/02/18 1858 02/07/18 1107   02/02/18 2200  ceFEPIme (MAXIPIME) 1 g in sodium chloride 0.9 % 100 mL IVPB  Status:  Discontinued     1 g 200 mL/hr over 30 Minutes Intravenous Every 12 hours 02/02/18 1847 02/02/18 1858   02/02/18 2200  ceFEPIme  (MAXIPIME) 1 g in sodium chloride 0.9 % 100 mL IVPB  Status:  Discontinued     1 g 200 mL/hr over 30 Minutes Intravenous Every 8 hours 02/02/18 1858 02/06/18 1442   02/02/18 1845  ceFEPIme (MAXIPIME) 2 g in sodium chloride 0.9 % 100 mL IVPB  Status:  Discontinued     2 g 200 mL/hr over 30 Minutes Intravenous  Once 02/02/18 1830 02/02/18 1847   02/02/18 1845  vancomycin (VANCOCIN) IVPB 1000 mg/200 mL premix  Status:  Discontinued     1,000 mg 200 mL/hr over 60 Minutes Intravenous  Once 02/02/18 1830 02/02/18 1847   02/02/18 1500  vancomycin (VANCOCIN) IVPB 1000 mg/200 mL premix     1,000 mg 200 mL/hr over 60 Minutes Intravenous  Once 02/02/18 1445 02/02/18 1647   02/02/18 1500  piperacillin-tazobactam (ZOSYN) IVPB 3.375 g     3.375 g 100 mL/hr over 30 Minutes Intravenous  Once 02/02/18 1445 02/02/18 1546      Procedures: 5/5>> bilateral AKA  CONSULTS:  orthopedic surgery  Time spent: 25- minutes-Greater than 50% of this time was spent in counseling, explanation of diagnosis, planning of further management, and coordination of care.  MEDICATIONS: Scheduled Meds: . busPIRone  15 mg Oral TID  . docusate sodium  100 mg Oral BID  . enoxaparin (LOVENOX) injection  30 mg Subcutaneous Q24H  . escitalopram  20 mg Oral QHS  . feeding supplement (ENSURE ENLIVE)  237 mL Oral BID BM  . gabapentin  200 mg Oral BID  . multivitamin with minerals  1 tablet Oral Daily  . polyethylene glycol  17 g Oral Daily   Continuous Infusions: . methocarbamol (ROBAXIN)  IV     PRN Meds:.acetaminophen, alum & mag hydroxide-simeth, bisacodyl, diphenhydrAMINE, HYDROmorphone (DILAUDID) injection, ibuprofen, ipratropium-albuterol, magnesium citrate, methocarbamol **OR** methocarbamol (ROBAXIN)  IV, metoCLOPramide **OR** metoCLOPramide (REGLAN) injection, morphine injection, ondansetron **OR** ondansetron (ZOFRAN) IV, oxyCODONE, oxyCODONE, zolpidem   PHYSICAL EXAM: Vital signs: Vitals:   02/26/18 0624  02/26/18 1207 02/26/18 2115 02/27/18 0604  BP: (!) 129/52 107/71 (!) 91/57 108/68  Pulse: (!) 56 67 77 68  Resp:  20 20 16   Temp: 98.3 F (36.8 C) 98.6 F (37 C) 98.2 F (36.8 C) 99 F (37.2 C)  TempSrc: Oral Oral Oral Oral  SpO2: 96% 96% 98% 100%  Weight:      Height:       Filed Weights   02/02/18 1636  Weight: 36.2 kg (79 lb 12.9 oz)   Body mass index is 15.08 kg/m.   Exam   Awake Alert, Oriented X 3, No new F.N deficits, Normal affect [place].AT,PERRAL Supple Neck,No JVD, No cervical lymphadenopathy appriciated.  Symmetrical Chest wall movement, Good air movement bilaterally, CTAB RRR,No Gallops, Rubs or new Murmurs, No Parasternal Heave +ve B.Sounds, Abd Soft, No tenderness, No organomegaly appriciated, No rebound - guarding or rigidity. No Cyanosis, Clubbing or edema,  No new Rash or bruise, bilateral AKA stump under bandage  I have personally reviewed following labs and imaging studies  LABORATORY DATA: CBC: Recent Labs  Lab 02/21/18 0450 02/27/18 0600  WBC 6.1 4.8  HGB 10.1* 10.4*  HCT 31.4* 31.7*  MCV 92.6 93.0  PLT 316 144    Basic Metabolic Panel: Recent Labs  Lab 02/21/18 0450 02/23/18 0441 02/25/18 0624 02/27/18 0600  NA 140  --   --  137  K 4.9  --   --  4.5  CL 105  --   --  106  CO2 26  --   --  21*  GLUCOSE 101*  --   --  79  BUN 15  --   --  15  CREATININE 0.80 0.69 0.68 0.75  0.78  CALCIUM 9.2  --   --  9.1    GFR: Estimated Creatinine Clearance: 45.9 mL/min (by C-G formula based on SCr of 0.78 mg/dL).  Liver Function Tests: No results for input(s): AST, ALT, ALKPHOS, BILITOT, PROT, ALBUMIN in the last 168 hours. No results for input(s): LIPASE, AMYLASE in the last 168 hours. No results for input(s): AMMONIA in the last 168 hours.  Coagulation Profile: No results for input(s): INR, PROTIME in the last 168 hours.  Cardiac Enzymes: No results for input(s): CKTOTAL, CKMB, CKMBINDEX, TROPONINI in the last 168 hours.  BNP (last 3  results) No results for input(s): PROBNP in the last 8760 hours.  HbA1C: No results for input(s): HGBA1C in the last 72 hours.  CBG: No results for input(s): GLUCAP in the last 168 hours.  Lipid Profile: No results for input(s): CHOL, HDL, LDLCALC, TRIG, CHOLHDL, LDLDIRECT in the last 72 hours.  Thyroid Function Tests: No results for input(s): TSH, T4TOTAL, FREET4, T3FREE, THYROIDAB in the last 72 hours.  Anemia Panel: No results for input(s): VITAMINB12, FOLATE, FERRITIN, TIBC, IRON, RETICCTPCT in the last 72 hours.  Urine analysis:    Component Value Date/Time   COLORURINE AMBER (A) 01/20/2018 1020   APPEARANCEUR HAZY (A) 01/20/2018 1020   LABSPEC 1.026 01/20/2018 1020   PHURINE 5.0 01/20/2018 1020   GLUCOSEU NEGATIVE 01/20/2018 1020   HGBUR LARGE (A) 01/20/2018 1020   BILIRUBINUR NEGATIVE 01/20/2018 1020   BILIRUBINUR small 04/29/2015 1533   KETONESUR NEGATIVE 01/20/2018 1020   PROTEINUR >=300 (A) 01/20/2018 1020   UROBILINOGEN 1.0 07/26/2017 1550   NITRITE NEGATIVE 01/20/2018 1020   LEUKOCYTESUR NEGATIVE 01/20/2018 1020    Sepsis Labs: Lactic Acid, Venous    Component Value Date/Time   LATICACIDVEN 1.1 02/03/2018 0013    MICROBIOLOGY: No results found for this or any previous visit (from the past 240 hour(s)).  RADIOLOGY STUDIES/RESULTS: Dg Chest 2 View  Addendum Date: 02/02/2018   ADDENDUM REPORT: 02/02/2018 14:33 ADDENDUM: Acute findings discussed with and reconfirmed by Dr.KEVIN CAMPOS on 02/02/2018 at 2:32 pm. Electronically Signed   By: Elon Alas M.D.   On: 02/02/2018 14:33   Result Date: 02/02/2018 CLINICAL DATA:  Mid chest pain, cough and congestion. EXAM: CHEST - 2 VIEW COMPARISON:  Chest radiograph January 20, 2018 FINDINGS: Multifocal RIGHT lung consolidation. Mild chronic interstitial changes without pleural effusion. Borderline cardiomegaly. Calcified aortic knob. No pneumothorax. Soft tissue planes included osseous structures are nonsuspicious.  IMPRESSION: 1. Multifocal RIGHT pneumonia. 2. Borderline cardiomegaly. 3.  Aortic Atherosclerosis (ICD10-I70.0). Electronically Signed: By: Elon Alas M.D. On: 02/02/2018 14:28   Dg Knee 1-2 Views Left  Result Date: 02/02/2018 CLINICAL DATA:  Knee pain.  Status  post BKA. EXAM: LEFT KNEE - 1-2 VIEW COMPARISON:  01/20/2018 FINDINGS: Bones are demineralized. No evidence for an acute fracture. No subluxation or dislocation. No worrisome lytic or sclerotic osseous abnormality. No gross joint effusion evident. There is some soft tissue irregularity at the stump. IMPRESSION: Osteopenia without acute bony abnormality. Soft tissue irregularity at the stump may be postoperative scarring although wound/ulcer could have this appearance. Electronically Signed   By: Misty Stanley M.D.   On: 02/02/2018 14:30   Dg Knee 1-2 Views Right  Result Date: 02/02/2018 CLINICAL DATA:  Pain and necrosis at stump, bleeding. EXAM: RIGHT KNEE - 1-2 VIEW COMPARISON:  RIGHT knee radiograph January 20, 2018 FINDINGS: Status post below-knee amputation. No acute osseous process. No rib fracture deformity or dislocation. No advanced degenerative change for age. Increasing soft tissue swelling with superficial irregularity and subcutaneous gas about the stump. IMPRESSION: Soft tissue swelling with new subcutaneous gas seen with necrotizing fasciitis or recent instrumentation. No acute osseous process. Acute findings discussed with and reconfirmed by Dr.KEVIN CAMPOS on 02/02/2018 at 2:32 pm. Electronically Signed   By: Elon Alas M.D.   On: 02/02/2018 14:32   Ct Head Wo Contrast  Result Date: 02/16/2018 CLINICAL DATA:  Pain following fall EXAM: CT HEAD WITHOUT CONTRAST CT CERVICAL SPINE WITHOUT CONTRAST TECHNIQUE: Multidetector CT imaging of the head and cervical spine was performed following the standard protocol without intravenous contrast. Multiplanar CT image reconstructions of the cervical spine were also generated. COMPARISON:   Head CT June 08, 2009 FINDINGS: CT HEAD FINDINGS Brain: The ventricles are normal in size and configuration. There is no intracranial mass, hemorrhage, extra-axial fluid collection, or midline shift. Gray-white compartments appear normal. No evident acute infarct. Vascular: There is no hyperdense vessel. There is calcification in each carotid siphon region. Skull: Bony calvarium appears intact. There is a right parietal scalp hematoma. Sinuses/Orbits: There is mucosal thickening in several ethmoid air cells. Other visualized paranasal sinuses are clear. Visualized orbits appear symmetric bilaterally. Note that there is increased attenuation in each globe which potentially may be artifactual. Other: Mastoid air cells are clear. CT CERVICAL SPINE FINDINGS Alignment: There is no appreciable spondylolisthesis. Skull base and vertebrae: Skull base and craniocervical junction regions appear normal. No evident fracture. No blastic or lytic bone lesions. Soft tissues and spinal canal: Prevertebral soft tissues and predental space regions are normal. No paraspinous lesions evident. No cord canal hematoma appreciable. Disc levels: There is moderate disc space narrowing at C5-6 and C6-7. There are anterior osteophytes at C5 and C6. There is a calcified central and left paracentral disc protrusion at C6-7 with mild impression on the ventral cord at this level. There is mild facet hypertrophy at several levels. Upper chest: Visualized upper lung zones are clear. There is a degree of underlying centrilobular emphysematous change. Other: There are foci of calcification in each carotid and subclavian artery. IMPRESSION: CT head: 1. No mass or hemorrhage. Gray-white compartments appear normal. Note that there is a right parietal scalp hematoma. No fracture evident. 2.  Foci of arterial vascular calcification noted. 3.  Mucosal thickening in several ethmoid air cells. 4. Increased attenuation in both globes may well be  artifactual. If patient is reporting vision difficulty, ophthalmological examination in this regard may be reasonable. CT cervical spine: No fracture or spondylolisthesis. Arthropathy at several levels with a calcified central and left paracentral disc protrusion at C6-7. No high-grade stenosis. Foci of calcification in subclavian and carotid arteries noted. Underlying centrilobular emphysematous change. Emphysema (ICD10-J43.9).  Electronically Signed   By: Lowella Grip III M.D.   On: 02/16/2018 13:18   Ct Cervical Spine Wo Contrast  Result Date: 02/16/2018 CLINICAL DATA:  Pain following fall EXAM: CT HEAD WITHOUT CONTRAST CT CERVICAL SPINE WITHOUT CONTRAST TECHNIQUE: Multidetector CT imaging of the head and cervical spine was performed following the standard protocol without intravenous contrast. Multiplanar CT image reconstructions of the cervical spine were also generated. COMPARISON:  Head CT June 08, 2009 FINDINGS: CT HEAD FINDINGS Brain: The ventricles are normal in size and configuration. There is no intracranial mass, hemorrhage, extra-axial fluid collection, or midline shift. Gray-white compartments appear normal. No evident acute infarct. Vascular: There is no hyperdense vessel. There is calcification in each carotid siphon region. Skull: Bony calvarium appears intact. There is a right parietal scalp hematoma. Sinuses/Orbits: There is mucosal thickening in several ethmoid air cells. Other visualized paranasal sinuses are clear. Visualized orbits appear symmetric bilaterally. Note that there is increased attenuation in each globe which potentially may be artifactual. Other: Mastoid air cells are clear. CT CERVICAL SPINE FINDINGS Alignment: There is no appreciable spondylolisthesis. Skull base and vertebrae: Skull base and craniocervical junction regions appear normal. No evident fracture. No blastic or lytic bone lesions. Soft tissues and spinal canal: Prevertebral soft tissues and predental  space regions are normal. No paraspinous lesions evident. No cord canal hematoma appreciable. Disc levels: There is moderate disc space narrowing at C5-6 and C6-7. There are anterior osteophytes at C5 and C6. There is a calcified central and left paracentral disc protrusion at C6-7 with mild impression on the ventral cord at this level. There is mild facet hypertrophy at several levels. Upper chest: Visualized upper lung zones are clear. There is a degree of underlying centrilobular emphysematous change. Other: There are foci of calcification in each carotid and subclavian artery. IMPRESSION: CT head: 1. No mass or hemorrhage. Gray-white compartments appear normal. Note that there is a right parietal scalp hematoma. No fracture evident. 2.  Foci of arterial vascular calcification noted. 3.  Mucosal thickening in several ethmoid air cells. 4. Increased attenuation in both globes may well be artifactual. If patient is reporting vision difficulty, ophthalmological examination in this regard may be reasonable. CT cervical spine: No fracture or spondylolisthesis. Arthropathy at several levels with a calcified central and left paracentral disc protrusion at C6-7. No high-grade stenosis. Foci of calcification in subclavian and carotid arteries noted. Underlying centrilobular emphysematous change. Emphysema (ICD10-J43.9). Electronically Signed   By: Lowella Grip III M.D.   On: 02/16/2018 13:18     LOS: 25 days   Signature  Lala Lund M.D on 02/27/2018 at 11:17 AM  Between 7am to 7pm - Pager - [phone] ( page via [email or web address], text pages only, please mention full 10 digit call back number).  After 7pm go to [email or web address] - password [email or web address]

## 2018-02-28 LAB — BASIC METABOLIC PANEL
ANION GAP: 7 (ref 5–15)
BUN: 14 mg/dL (ref 6–20)
CALCIUM: 9.2 mg/dL (ref 8.9–10.3)
CO2: 26 mmol/L (ref 22–32)
CREATININE: 0.64 mg/dL (ref 0.44–1.00)
Chloride: 106 mmol/L (ref 101–111)
GFR calc non Af Amer: >60 mL/min (ref >60)
Glucose, Bld: 99 mg/dL (ref 65–99)
Potassium: 4.8 mmol/L (ref 3.5–5.1)
Sodium: 139 mmol/L (ref 135–145)

## 2018-02-28 LAB — CBC
HEMATOCRIT: 32.9 % — AB (ref 36.0–46.0)
Hemoglobin: 10.6 g/dL — ABNORMAL LOW (ref 12.0–15.0)
MCH: 30.2 pg (ref 26.0–34.0)
MCHC: 32.2 g/dL (ref 30.0–36.0)
MCV: 93.7 fL (ref 78.0–100.0)
PLATELETS: 226 10*3/uL (ref 150–400)
RBC: 3.51 MIL/uL — ABNORMAL LOW (ref 3.87–5.11)
RDW: 15.4 % (ref 11.5–15.5)
WBC: 5.7 10*3/uL (ref 4.0–10.5)

## 2018-02-28 NOTE — Progress Notes (Signed)
PROGRESS NOTE        PATIENT DETAILS Name: [name] Age: 57 y.o. Sex: female Date of Birth: [birth year] Admit Date: 02/02/2018 Admitting Physician Annita Brod, MD PCP:No primary care provider on file.  Brief Narrative: Patient is a 57 y.o. female prior history of cocaine use, hypertension, bilateral BKA-admitted with shortness of breath, found to have pneumonia, she was also found to have bilateral BKA stump infection-she was evaluated by orthopedics and underwent bilateral AKA.  Awaiting SNF placement.  Subjective:  Patient in bed, appears comfortable, denies any headache, no fever, no chest pain or pressure, no shortness of breath , no abdominal pain. No focal weakness.    Assessment/Plan: Sepsis secondary to healthcare associated pneumonia and bilateral BKA stump infection with gangrene: Sepsis pathophysiology has resolved, underwent bilateral AKA-finished on antibiotic treatment on 02/13/2018.  Cultures negative.  She is medically stable but awaits SNF bed for placement.  Bilateral BKA stump infection with gangrene: Status post bilateral AKA on 5/5-previous MD had spoken with Dr. Sharol Given on 5/14-okay to remove a wound VAC and place a 4 x 4 dressing with ACE wraps, also okay to remove staples which was done on 02/23/2018.  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  Mechanical fall: Patient fell from a wheelchair on 5/15, CT of the head and CT of the C-spine negative for abnormalities.  Small scalp hematoma in the occipital area appears to be stable.    Anemia: Multifactorial-probably secondary to acute illness-hemoglobin remained stable, has been transfused a total of 3 units of PRBC so far.  Currently H&H stable.  Hypertension: Controlled-continue on no medications, Norvasc on hold.  Polysubstance abuse/cocaine use: Counseled.  Severe protein calorie malnutrition: Continue supplements.    DVT Prophylaxis: Prophylactic  Lovenox   Code Status: Full code   Family Communication: None at bedside  Disposition Plan: Remain inpatient-family refused CIR-awaiting SNF placement.  Antimicrobial agents: Anti-infectives (From admission, onward)   Start     Dose/Rate Route Frequency Ordered Stop   02/09/18 1000  levofloxacin (LEVAQUIN) tablet 500 mg    Note to Pharmacy:  Dr switching pt over to Levaquin 500 mg oral per note.   500 mg Oral Daily 02/08/18 1349 02/13/18 1033   02/08/18 1145  levofloxacin (LEVAQUIN) IVPB 500 mg  Status:  Discontinued     500 mg 100 mL/hr over 60 Minutes Intravenous Every 24 hours 02/08/18 1142 02/08/18 1351   02/08/18 0900  vancomycin (VANCOCIN) IVPB 1000 mg/200 mL premix  Status:  Discontinued     1,000 mg 200 mL/hr over 60 Minutes Intravenous Every 24 hours 02/07/18 1107 02/08/18 1142   02/07/18 0600  ceFEPIme (MAXIPIME) 1 g in sodium chloride 0.9 % 100 mL IVPB  Status:  Discontinued     1 g 200 mL/hr over 30 Minutes Intravenous Every 24 hours 02/06/18 1442 02/08/18 1142   02/06/18 0630  ceFAZolin (ANCEF) IVPB 2g/100 mL premix  Status:  Discontinued     2 g 200 mL/hr over 30 Minutes Intravenous To [facility] 02/05/18 1035 02/06/18 0925   02/05/18 1015  ceFAZolin (ANCEF) IVPB 2g/100 mL premix     2 g 200 mL/hr over 30 Minutes Intravenous On call to O.R. 02/05/18 1011 02/06/18 0559   02/03/18 1800  vancomycin (VANCOCIN) 500 mg in sodium chloride 0.9 % 100 mL IVPB  Status:  Discontinued     500 mg 100 mL/hr over 60 Minutes Intravenous Every 24 hours 02/02/18 1847 02/02/18 1858   02/03/18 1000  vancomycin (VANCOCIN) IVPB 750 mg/150 ml premix  Status:  Discontinued     750 mg 150 mL/hr over 60 Minutes Intravenous Every 24 hours 02/02/18 1858 02/07/18 1107   02/02/18 2200  ceFEPIme (MAXIPIME) 1 g in sodium chloride 0.9 % 100 mL IVPB  Status:  Discontinued     1 g 200 mL/hr over 30 Minutes Intravenous Every 12 hours 02/02/18 1847 02/02/18 1858   02/02/18 2200  ceFEPIme  (MAXIPIME) 1 g in sodium chloride 0.9 % 100 mL IVPB  Status:  Discontinued     1 g 200 mL/hr over 30 Minutes Intravenous Every 8 hours 02/02/18 1858 02/06/18 1442   02/02/18 1845  ceFEPIme (MAXIPIME) 2 g in sodium chloride 0.9 % 100 mL IVPB  Status:  Discontinued     2 g 200 mL/hr over 30 Minutes Intravenous  Once 02/02/18 1830 02/02/18 1847   02/02/18 1845  vancomycin (VANCOCIN) IVPB 1000 mg/200 mL premix  Status:  Discontinued     1,000 mg 200 mL/hr over 60 Minutes Intravenous  Once 02/02/18 1830 02/02/18 1847   02/02/18 1500  vancomycin (VANCOCIN) IVPB 1000 mg/200 mL premix     1,000 mg 200 mL/hr over 60 Minutes Intravenous  Once 02/02/18 1445 02/02/18 1647   02/02/18 1500  piperacillin-tazobactam (ZOSYN) IVPB 3.375 g     3.375 g 100 mL/hr over 30 Minutes Intravenous  Once 02/02/18 1445 02/02/18 1546      Procedures: 5/5>> bilateral AKA  CONSULTS:  orthopedic surgery  Time spent: 25- minutes-Greater than 50% of this time was spent in counseling, explanation of diagnosis, planning of further management, and coordination of care.  MEDICATIONS: Scheduled Meds: . busPIRone  15 mg Oral TID  . docusate sodium  100 mg Oral BID  . enoxaparin (LOVENOX) injection  30 mg Subcutaneous Q24H  . escitalopram  20 mg Oral QHS  . feeding supplement (ENSURE ENLIVE)  237 mL Oral BID BM  . gabapentin  200 mg Oral BID  . multivitamin with minerals  1 tablet Oral Daily  . polyethylene glycol  17 g Oral Daily   Continuous Infusions: . methocarbamol (ROBAXIN)  IV     PRN Meds:.acetaminophen, alum & mag hydroxide-simeth, bisacodyl, diphenhydrAMINE, HYDROmorphone (DILAUDID) injection, ibuprofen, ipratropium-albuterol, magnesium citrate, methocarbamol **OR** methocarbamol (ROBAXIN)  IV, metoCLOPramide **OR** metoCLOPramide (REGLAN) injection, morphine injection, ondansetron **OR** ondansetron (ZOFRAN) IV, oxyCODONE, oxyCODONE, zolpidem   PHYSICAL EXAM: Vital signs: Vitals:   02/26/18 2115  02/27/18 0604 02/27/18 1352 02/27/18 2200  BP: (!) 91/57 108/68 112/65 105/60  Pulse: 77 68 69 67  Resp: 20 16 20    Temp: 98.2 F (36.8 C) 99 F (37.2 C) 98.5 F (36.9 C) 98.5 F (36.9 C)  TempSrc: Oral Oral Oral Oral  SpO2: 98% 100% 100% 100%  Weight:      Height:       Filed Weights   02/02/18 1636  Weight: 36.2 kg (79 lb 12.9 oz)   Body mass index is 15.08 kg/m.   Exam   Awake Alert, Oriented X 3, No new F.N deficits, Normal affect [place].AT,PERRAL Supple Neck,No JVD, No cervical lymphadenopathy appriciated.  Symmetrical Chest wall movement, Good air movement bilaterally, CTAB RRR,No Gallops, Rubs ,  No Parasternal Heave +ve B.Sounds, Abd Soft, No tenderness, No organomegaly appriciated, No rebound - guarding or rigidity. No Cyanosis, Clubbing or edema, No new Rash  or bruise Bilateral AKA stump under bandage  I have personally reviewed following labs and imaging studies  LABORATORY DATA: CBC: Recent Labs  Lab 02/27/18 0600 02/28/18 0419  WBC 4.8 5.7  HGB 10.4* 10.6*  HCT 31.7* 32.9*  MCV 93.0 93.7  PLT 216 725    Basic Metabolic Panel: Recent Labs  Lab 02/23/18 0441 02/25/18 0624 02/27/18 0600 02/28/18 0419  NA  --   --  137 139  K  --   --  4.5 4.8  CL  --   --  106 106  CO2  --   --  21* 26  GLUCOSE  --   --  79 99  BUN  --   --  15 14  CREATININE 0.69 0.68 0.75  0.78 0.64  CALCIUM  --   --  9.1 9.2    GFR: Estimated Creatinine Clearance: 45.9 mL/min (by C-G formula based on SCr of 0.64 mg/dL).  Liver Function Tests: No results for input(s): AST, ALT, ALKPHOS, BILITOT, PROT, ALBUMIN in the last 168 hours. No results for input(s): LIPASE, AMYLASE in the last 168 hours. No results for input(s): AMMONIA in the last 168 hours.  Coagulation Profile: No results for input(s): INR, PROTIME in the last 168 hours.  Cardiac Enzymes: No results for input(s): CKTOTAL, CKMB, CKMBINDEX, TROPONINI in the last 168 hours.  BNP (last 3 results) No results  for input(s): PROBNP in the last 8760 hours.  HbA1C: No results for input(s): HGBA1C in the last 72 hours.  CBG: No results for input(s): GLUCAP in the last 168 hours.  Lipid Profile: No results for input(s): CHOL, HDL, LDLCALC, TRIG, CHOLHDL, LDLDIRECT in the last 72 hours.  Thyroid Function Tests: No results for input(s): TSH, T4TOTAL, FREET4, T3FREE, THYROIDAB in the last 72 hours.  Anemia Panel: No results for input(s): VITAMINB12, FOLATE, FERRITIN, TIBC, IRON, RETICCTPCT in the last 72 hours.  Urine analysis:    Component Value Date/Time   COLORURINE AMBER (A) 01/20/2018 1020   APPEARANCEUR HAZY (A) 01/20/2018 1020   LABSPEC 1.026 01/20/2018 1020   PHURINE 5.0 01/20/2018 1020   GLUCOSEU NEGATIVE 01/20/2018 1020   HGBUR LARGE (A) 01/20/2018 1020   BILIRUBINUR NEGATIVE 01/20/2018 1020   BILIRUBINUR small 04/29/2015 1533   KETONESUR NEGATIVE 01/20/2018 1020   PROTEINUR >=300 (A) 01/20/2018 1020   UROBILINOGEN 1.0 07/26/2017 1550   NITRITE NEGATIVE 01/20/2018 1020   LEUKOCYTESUR NEGATIVE 01/20/2018 1020    Sepsis Labs: Lactic Acid, Venous    Component Value Date/Time   LATICACIDVEN 1.1 02/03/2018 0013    MICROBIOLOGY: No results found for this or any previous visit (from the past 240 hour(s)).  RADIOLOGY STUDIES/RESULTS: Dg Chest 2 View  Addendum Date: 02/02/2018   ADDENDUM REPORT: 02/02/2018 14:33 ADDENDUM: Acute findings discussed with and reconfirmed by Dr.KEVIN CAMPOS on 02/02/2018 at 2:32 pm. Electronically Signed   By: Elon Alas M.D.   On: 02/02/2018 14:33   Result Date: 02/02/2018 CLINICAL DATA:  Mid chest pain, cough and congestion. EXAM: CHEST - 2 VIEW COMPARISON:  Chest radiograph January 20, 2018 FINDINGS: Multifocal RIGHT lung consolidation. Mild chronic interstitial changes without pleural effusion. Borderline cardiomegaly. Calcified aortic knob. No pneumothorax. Soft tissue planes included osseous structures are nonsuspicious. IMPRESSION: 1.  Multifocal RIGHT pneumonia. 2. Borderline cardiomegaly. 3.  Aortic Atherosclerosis (ICD10-I70.0). Electronically Signed: By: Elon Alas M.D. On: 02/02/2018 14:28   Dg Knee 1-2 Views Left  Result Date: 02/02/2018 CLINICAL DATA:  Knee pain.  Status post BKA. EXAM:  LEFT KNEE - 1-2 VIEW COMPARISON:  01/20/2018 FINDINGS: Bones are demineralized. No evidence for an acute fracture. No subluxation or dislocation. No worrisome lytic or sclerotic osseous abnormality. No gross joint effusion evident. There is some soft tissue irregularity at the stump. IMPRESSION: Osteopenia without acute bony abnormality. Soft tissue irregularity at the stump may be postoperative scarring although wound/ulcer could have this appearance. Electronically Signed   By: Misty Stanley M.D.   On: 02/02/2018 14:30   Dg Knee 1-2 Views Right  Result Date: 02/02/2018 CLINICAL DATA:  Pain and necrosis at stump, bleeding. EXAM: RIGHT KNEE - 1-2 VIEW COMPARISON:  RIGHT knee radiograph January 20, 2018 FINDINGS: Status post below-knee amputation. No acute osseous process. No rib fracture deformity or dislocation. No advanced degenerative change for age. Increasing soft tissue swelling with superficial irregularity and subcutaneous gas about the stump. IMPRESSION: Soft tissue swelling with new subcutaneous gas seen with necrotizing fasciitis or recent instrumentation. No acute osseous process. Acute findings discussed with and reconfirmed by Dr.KEVIN CAMPOS on 02/02/2018 at 2:32 pm. Electronically Signed   By: Elon Alas M.D.   On: 02/02/2018 14:32   Ct Head Wo Contrast  Result Date: 02/16/2018 CLINICAL DATA:  Pain following fall EXAM: CT HEAD WITHOUT CONTRAST CT CERVICAL SPINE WITHOUT CONTRAST TECHNIQUE: Multidetector CT imaging of the head and cervical spine was performed following the standard protocol without intravenous contrast. Multiplanar CT image reconstructions of the cervical spine were also generated. COMPARISON:  Head CT  June 08, 2009 FINDINGS: CT HEAD FINDINGS Brain: The ventricles are normal in size and configuration. There is no intracranial mass, hemorrhage, extra-axial fluid collection, or midline shift. Gray-white compartments appear normal. No evident acute infarct. Vascular: There is no hyperdense vessel. There is calcification in each carotid siphon region. Skull: Bony calvarium appears intact. There is a right parietal scalp hematoma. Sinuses/Orbits: There is mucosal thickening in several ethmoid air cells. Other visualized paranasal sinuses are clear. Visualized orbits appear symmetric bilaterally. Note that there is increased attenuation in each globe which potentially may be artifactual. Other: Mastoid air cells are clear. CT CERVICAL SPINE FINDINGS Alignment: There is no appreciable spondylolisthesis. Skull base and vertebrae: Skull base and craniocervical junction regions appear normal. No evident fracture. No blastic or lytic bone lesions. Soft tissues and spinal canal: Prevertebral soft tissues and predental space regions are normal. No paraspinous lesions evident. No cord canal hematoma appreciable. Disc levels: There is moderate disc space narrowing at C5-6 and C6-7. There are anterior osteophytes at C5 and C6. There is a calcified central and left paracentral disc protrusion at C6-7 with mild impression on the ventral cord at this level. There is mild facet hypertrophy at several levels. Upper chest: Visualized upper lung zones are clear. There is a degree of underlying centrilobular emphysematous change. Other: There are foci of calcification in each carotid and subclavian artery. IMPRESSION: CT head: 1. No mass or hemorrhage. Gray-white compartments appear normal. Note that there is a right parietal scalp hematoma. No fracture evident. 2.  Foci of arterial vascular calcification noted. 3.  Mucosal thickening in several ethmoid air cells. 4. Increased attenuation in both globes may well be artifactual. If  patient is reporting vision difficulty, ophthalmological examination in this regard may be reasonable. CT cervical spine: No fracture or spondylolisthesis. Arthropathy at several levels with a calcified central and left paracentral disc protrusion at C6-7. No high-grade stenosis. Foci of calcification in subclavian and carotid arteries noted. Underlying centrilobular emphysematous change. Emphysema (ICD10-J43.9). Electronically Signed  By: Lowella Grip III M.D.   On: 02/16/2018 13:18   Ct Cervical Spine Wo Contrast  Result Date: 02/16/2018 CLINICAL DATA:  Pain following fall EXAM: CT HEAD WITHOUT CONTRAST CT CERVICAL SPINE WITHOUT CONTRAST TECHNIQUE: Multidetector CT imaging of the head and cervical spine was performed following the standard protocol without intravenous contrast. Multiplanar CT image reconstructions of the cervical spine were also generated. COMPARISON:  Head CT June 08, 2009 FINDINGS: CT HEAD FINDINGS Brain: The ventricles are normal in size and configuration. There is no intracranial mass, hemorrhage, extra-axial fluid collection, or midline shift. Gray-white compartments appear normal. No evident acute infarct. Vascular: There is no hyperdense vessel. There is calcification in each carotid siphon region. Skull: Bony calvarium appears intact. There is a right parietal scalp hematoma. Sinuses/Orbits: There is mucosal thickening in several ethmoid air cells. Other visualized paranasal sinuses are clear. Visualized orbits appear symmetric bilaterally. Note that there is increased attenuation in each globe which potentially may be artifactual. Other: Mastoid air cells are clear. CT CERVICAL SPINE FINDINGS Alignment: There is no appreciable spondylolisthesis. Skull base and vertebrae: Skull base and craniocervical junction regions appear normal. No evident fracture. No blastic or lytic bone lesions. Soft tissues and spinal canal: Prevertebral soft tissues and predental space regions are  normal. No paraspinous lesions evident. No cord canal hematoma appreciable. Disc levels: There is moderate disc space narrowing at C5-6 and C6-7. There are anterior osteophytes at C5 and C6. There is a calcified central and left paracentral disc protrusion at C6-7 with mild impression on the ventral cord at this level. There is mild facet hypertrophy at several levels. Upper chest: Visualized upper lung zones are clear. There is a degree of underlying centrilobular emphysematous change. Other: There are foci of calcification in each carotid and subclavian artery. IMPRESSION: CT head: 1. No mass or hemorrhage. Gray-white compartments appear normal. Note that there is a right parietal scalp hematoma. No fracture evident. 2.  Foci of arterial vascular calcification noted. 3.  Mucosal thickening in several ethmoid air cells. 4. Increased attenuation in both globes may well be artifactual. If patient is reporting vision difficulty, ophthalmological examination in this regard may be reasonable. CT cervical spine: No fracture or spondylolisthesis. Arthropathy at several levels with a calcified central and left paracentral disc protrusion at C6-7. No high-grade stenosis. Foci of calcification in subclavian and carotid arteries noted. Underlying centrilobular emphysematous change. Emphysema (ICD10-J43.9). Electronically Signed   By: Lowella Grip III M.D.   On: 02/16/2018 13:18     LOS: 26 days   Signature  Lala Lund M.D on 02/28/2018 at 12:58 PM  Between 7am to 7pm - Pager - [phone] ( page via [email or web address], text pages only, please mention full 10 digit call back number).  After 7pm go to [email or web address] - password [email or web address]

## 2018-02-28 NOTE — Social Work (Signed)
CSW met with patient at bedside discussed disposition. CSW explained that patient is able to return home to boarding home since CSW unable to get patient placed in ALF.    CSW called brother [name],[phone] and left message to discuss disposition.   Elissa Hefty, LCSW Clinical Social Worker [phone]

## 2018-03-01 LAB — CREATININE, SERUM
Creatinine, Ser: 0.61 mg/dL (ref 0.44–1.00)
GFR calc Af Amer: >60 mL/min (ref >60)
GFR calc non Af Amer: >60 mL/min (ref >60)

## 2018-03-01 MED ORDER — MORPHINE SULFATE (PF) 2 MG/ML IV SOLN: 1.0000 mg | INTRAVENOUS | Status: DC | PRN

## 2018-03-01 NOTE — Social Work (Signed)
CSW met with patient at bedside to discuss disposition. Pt indicated that brother provided phone number for [place]. CSW advised patient that she would need to complete intake with brother and that her brother would have to coordinate placement if that is what he desires. CSW also left message on voicemail of brother advising of same.   CSW advised pt that we will look to set up transport home as patient is medically stable. CSW reiterated that patient from rooming home and had CNA 3x's a week with prior impairment and can continue services.  CSW will f/u for disposition.  Elissa Hefty, LCSW Clinical Social Worker [phone]

## 2018-03-01 NOTE — Plan of Care (Signed)
  Problem: Activity: Goal: Risk for activity intolerance will decrease Outcome: Progressing   Problem: Nutrition: Goal: Adequate nutrition will be maintained Outcome: Progressing   Problem: Self-Care: Goal: Ability to meet self-care needs will improve Outcome: Progressing

## 2018-03-01 NOTE — Progress Notes (Signed)
Occupational Therapy Treatment Patient Details Name: [name] MRN: [id number] DOB: 02-10-1961 Today's Date: 03/01/2018    History of present illness 57 y.o. female admitted on 02/02/18 for bilateral leg pain and trouble breathing.  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  Pt with significant PMH of vasculitis, anemia, migraines, inflammatory arthritis, cocaine abuse, multiple bil amputations of LEs.     OT comments  Pt demonstrating progress toward OT goals this session. She was able to complete transfers from bed to wheelchair, mobility into bathroom, and transfer from wheelchair to comfort height commode with modified independence today. Pt reports that her wheelchair will not fit through doorway of bathroom at home and she is unable to access a sink at the boarding house she has been staying in. She reports that she will be able to have assistance with cleaning BSC and her caregivers typically bring her a bucket of water for hand hygiene. Pt would benefit from continued OT services while admitted to improve independence and safety with ADL and functional mobility. Continue to feel that pt would benefit from SNF placement to maximize independence and safety and due to inaccessible home environment. However, if unable to D/C to SNF, will need home health OT services.   Follow Up Recommendations  Supervision/Assistance - 24 hour;Home health OT;SNF    Equipment Recommendations  3 in 1 bedside commode;Other (comment)(drop-arm 3-in-1)    Recommendations for Other Services      Precautions / Restrictions Precautions Precautions: Fall Restrictions Weight Bearing Restrictions: Yes RLE Weight Bearing: Non weight bearing LLE Weight Bearing: Non weight bearing       Mobility Bed Mobility Overal bed mobility: Modified Independent                Transfers Overall  transfer level: Needs assistance Equipment used: None Transfers: Comptroller transfers: Modified independent (Device/Increase time)   General transfer comment: Pt able to complete anterior/posterior transfers to and from w/c and commode with modified independence today.     Balance Overall balance assessment: Needs assistance Sitting-balance support: Feet unsupported;Bilateral upper extremity supported Sitting balance-Leahy Scale: Fair                                     ADL either performed or assessed with clinical judgement   ADL Overall ADL's : Needs assistance/impaired     Grooming: Modified independent;Wash/dry hands(at wheelchair level)                   Toilet Transfer: Modified Independent;Anterior/posterior;Comfort height toilet(from w/c to commode) Toilet Transfer Details (indicate cue type and reason): Pt able to complete anterior/posterior transfer from w/c to comfort height commode with modified independence today and utilized commode facing backwards but was very functional with this task.  Toileting- Clothing Manipulation and Hygiene: Modified independent;Sitting/lateral lean       Functional mobility during ADLs: Modified independent;Wheelchair General ADL Comments: Pt able to transfer to and from wheelchair with modified independence utilizing aterior/posterior technique. Pt reports that her wheelchair will not fit into bathroom and she does not have access to sink at home so her friends bring her buckets of water and clean her BSC.      Vision       Perception  Praxis      Cognition Arousal/Alertness: Awake/alert Behavior During Therapy: WFL for tasks assessed/performed Overall Cognitive Status: Within Functional Limits for tasks assessed                                          Exercises     Shoulder Instructions       General Comments Recommended that pt  continue with HEP for B UE strength.     Pertinent Vitals/ Pain       Pain Assessment: Faces Faces Pain Scale: Hurts a little bit Pain Location: bil residual limbs Pain Descriptors / Indicators: Discomfort Pain Intervention(s): Monitored during session;Limited activity within patient's tolerance  Home Living                                          Prior Functioning/Environment              Frequency  Min 1X/week        Progress Toward Goals  OT Goals(current goals can now be found in the care plan section)  Progress towards OT goals: Progressing toward goals  Acute Rehab OT Goals Patient Stated Goal: to be more independent OT Goal Formulation: With patient Time For Goal Achievement: 03/09/18 Potential to Achieve Goals: Good  Plan Frequency needs to be updated;Discharge plan needs to be updated    Co-evaluation                 AM-PAC PT "6 Clicks" Daily Activity     Outcome Measure   Help from another person eating meals?: None Help from another person taking care of personal grooming?: None Help from another person toileting, which includes using toliet, bedpan, or urinal?: None Help from another person bathing (including washing, rinsing, drying)?: A Little Help from another person to put on and taking off regular upper body clothing?: A Little Help from another person to put on and taking off regular lower body clothing?: A Little 6 Click Score: 21    End of Session Equipment Utilized During Treatment: Other (comment)(wheelchair)  OT Visit Diagnosis: Muscle weakness (generalized) (M62.81);Other abnormalities of gait and mobility (R26.89)   Activity Tolerance Patient tolerated treatment well   Patient Left in bed;with call bell/phone within reach   Nurse Communication Mobility status        Time: 1218-1228 OT Time Calculation (min): 10 min  Charges: OT General Charges $OT Visit: 1 Visit OT Treatments $Self Care/Home  Management : 8-22 mins  Norman Herrlich, MS OTR/L  Pager: [place] [facility] [name] 03/01/2018, 2:44 PM

## 2018-03-01 NOTE — Progress Notes (Signed)
Physical Therapy Treatment Patient Details Name: [name] MRN: [id number] DOB: 06/09/1961 Today's Date: 03/01/2018    History of Present Illness 57 y.o. female admitted on 02/02/18 for bilateral leg pain and trouble breathing.  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  Pt with significant PMH of vasculitis, anemia, migraines, inflammatory arthritis, cocaine abuse, multiple bil amputations of LEs.      PT Comments    Pt performed transfer and bed mobility activities and is currently functioning at baseline and all goals are met.  Based on her physical limitations from B AKAs she is best suited for SNF placement for long term care.  Pt remains to require assistance for technique when performing HEP.  Will f/u for review of HEP for accuracy and then d/c from PT caseload.    Follow Up Recommendations  SNF     Equipment Recommendations  None recommended by PT    Recommendations for Other Services       Precautions / Restrictions Precautions Precautions: Fall Restrictions Weight Bearing Restrictions: Yes RLE Weight Bearing: Non weight bearing LLE Weight Bearing: Non weight bearing    Mobility  Bed Mobility Overal bed mobility: Modified Independent Bed Mobility: Rolling Rolling: Independent   Supine to sit: Modified independent (Device/Increase time) Sit to supine: Modified independent (Device/Increase time)   General bed mobility comments: No assistance needed.    Transfers Overall transfer level: Modified independent Equipment used: None Transfers: Anterior-Posterior Transfer       Anterior-Posterior transfers: Modified independent (Device/Increase time)   General transfer comment: Pt able to complete anterior/posterior transfers to and from w/c with set up including locking of brakes for safety during transfers.    Ambulation/Gait Ambulation/Gait  assistance: (N/A)               Stairs             Wheelchair Mobility    Modified Rankin (Stroke Patients Only)       Balance Overall balance assessment: Needs assistance Sitting-balance support: Feet unsupported;Bilateral upper extremity supported Sitting balance-Leahy Scale: Fair Sitting balance - Comments: Relies on B UE support.                                     Cognition Arousal/Alertness: Awake/alert Behavior During Therapy: WFL for tasks assessed/performed Overall Cognitive Status: Within Functional Limits for tasks assessed                                        Exercises Amputee Exercises Quad Sets: AROM;Both;10 reps;Supine Gluteal Sets: AROM;Both;10 reps;Supine Towel Squeeze: AROM;Both;10 reps;Supine Hip Extension: AROM;Both;10 reps;Sidelying Hip ABduction/ADduction: AROM;Both;10 reps;Sidelying Hip Flexion/Marching: AROM;Both;10 reps;Supine    General Comments General comments (skin integrity, edema, etc.): Recommended that pt continue with HEP for B UE strength.       Pertinent Vitals/Pain Pain Assessment: No/denies pain Faces Pain Scale: Hurts a little bit Pain Location: bil residual limbs Pain Descriptors / Indicators: Discomfort Pain Intervention(s): Monitored during session;Limited activity within patient's tolerance    Home Living                      Prior Function              PT Goals (current goals can now be found in the care plan section) Acute Rehab PT Goals Patient Stated Goal: to be more independent Potential to Achieve Goals: Good Progress towards PT goals: Progressing toward goals    Frequency    Min 2X/week      PT Plan Current plan remains appropriate    Co-evaluation              AM-PAC PT "6 Clicks" Daily Activity  Outcome Measure  Difficulty turning over in bed (including adjusting bedclothes, sheets and blankets)?: None Difficulty moving from lying on  back to sitting on the side of the bed? : None Difficulty sitting down on and standing up from a chair with arms (e.g., wheelchair, bedside commode, etc,.)?: Unable Help needed moving to and from a bed to chair (including a wheelchair)?: None Help needed walking in hospital room?: Total Help needed climbing 3-5 steps with a railing? : Total 6 Click Score: 15    End of Session   Activity Tolerance: Patient limited by pain Patient left: with call bell/phone within reach;in bed Nurse Communication: Mobility status PT Visit Diagnosis: Muscle weakness (generalized) (M62.81);Pain Pain - Right/Left: (Bilateral) Pain - part of body: (legs)     Time: 4174-0814 PT Time Calculation (min) (ACUTE ONLY): 20 min  Charges:  $Therapeutic Exercise: 8-22 mins                    G Codes:       [name], PTA pager [phone]    [name] 03/01/2018, 5:06 PM

## 2018-03-01 NOTE — Progress Notes (Signed)
PROGRESS NOTE        PATIENT DETAILS Name: [name] Age: 57 y.o. Sex: female Date of Birth: 1961/03/27 Admit Date: 02/02/2018 Admitting Physician Annita Brod, MD PCP:No primary care provider on file.  Brief Narrative: Patient is a 57 y.o. female prior history of cocaine use, hypertension, bilateral BKA-admitted with shortness of breath, found to have pneumonia, she was also found to have bilateral BKA stump infection-she was evaluated by orthopedics and underwent bilateral AKA.  Awaiting SNF placement.  Subjective:  Patient in bed, appears comfortable, denies any headache, no fever, no chest pain or pressure, no shortness of breath , no abdominal pain. No focal weakness.     Assessment/Plan: Sepsis secondary to healthcare associated pneumonia and bilateral BKA stump infection with gangrene: Sepsis pathophysiology has resolved, underwent bilateral AKA-finished on antibiotic treatment on 02/13/2018.  Cultures negative.  She is medically stable but awaits SNF bed for placement.  Bilateral BKA stump infection with gangrene: Status post bilateral AKA on 5/5-previous MD had spoken with Dr. Sharol Given on 5/14-okay to remove a wound VAC and place a 4 x 4 dressing with ACE wraps, also okay to remove staples which was done on 02/23/2018.  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  Mechanical fall: Patient fell from a wheelchair on 5/15, CT of the head and CT of the C-spine negative for abnormalities.  Small scalp hematoma in the occipital area appears to be stable.    Anemia: Multifactorial-probably secondary to acute illness-hemoglobin remained stable, has been transfused a total of 3 units of PRBC so far.  Currently H&H stable.  Hypertension: Controlled-continue on no medications, Norvasc on hold.  Polysubstance abuse/cocaine use: Counseled.  Severe protein calorie malnutrition: Continue supplements.    DVT Prophylaxis: Prophylactic  Lovenox   Code Status: Full code   Family Communication: None at bedside  Disposition Plan: Remain inpatient-family refused CIR-awaiting SNF placement, medically ready for discharge.  Antimicrobial agents: Anti-infectives (From admission, onward)   Start     Dose/Rate Route Frequency Ordered Stop   02/09/18 1000  levofloxacin (LEVAQUIN) tablet 500 mg    Note to Pharmacy:  Dr switching pt over to Levaquin 500 mg oral per note.   500 mg Oral Daily 02/08/18 1349 02/13/18 1033   02/08/18 1145  levofloxacin (LEVAQUIN) IVPB 500 mg  Status:  Discontinued     500 mg 100 mL/hr over 60 Minutes Intravenous Every 24 hours 02/08/18 1142 02/08/18 1351   02/08/18 0900  vancomycin (VANCOCIN) IVPB 1000 mg/200 mL premix  Status:  Discontinued     1,000 mg 200 mL/hr over 60 Minutes Intravenous Every 24 hours 02/07/18 1107 02/08/18 1142   02/07/18 0600  ceFEPIme (MAXIPIME) 1 g in sodium chloride 0.9 % 100 mL IVPB  Status:  Discontinued     1 g 200 mL/hr over 30 Minutes Intravenous Every 24 hours 02/06/18 1442 02/08/18 1142   02/06/18 0630  ceFAZolin (ANCEF) IVPB 2g/100 mL premix  Status:  Discontinued     2 g 200 mL/hr over 30 Minutes Intravenous To [facility] 02/05/18 1035 02/06/18 0925   02/05/18 1015  ceFAZolin (ANCEF) IVPB 2g/100 mL premix     2 g 200 mL/hr over 30 Minutes Intravenous On call to O.R. 02/05/18 1011 02/06/18 0559   02/03/18 1800  vancomycin (VANCOCIN) 500 mg in sodium chloride 0.9 %  100 mL IVPB  Status:  Discontinued     500 mg 100 mL/hr over 60 Minutes Intravenous Every 24 hours 02/02/18 1847 02/02/18 1858   02/03/18 1000  vancomycin (VANCOCIN) IVPB 750 mg/150 ml premix  Status:  Discontinued     750 mg 150 mL/hr over 60 Minutes Intravenous Every 24 hours 02/02/18 1858 02/07/18 1107   02/02/18 2200  ceFEPIme (MAXIPIME) 1 g in sodium chloride 0.9 % 100 mL IVPB  Status:  Discontinued     1 g 200 mL/hr over 30 Minutes Intravenous Every 12 hours 02/02/18 1847 02/02/18 1858    02/02/18 2200  ceFEPIme (MAXIPIME) 1 g in sodium chloride 0.9 % 100 mL IVPB  Status:  Discontinued     1 g 200 mL/hr over 30 Minutes Intravenous Every 8 hours 02/02/18 1858 02/06/18 1442   02/02/18 1845  ceFEPIme (MAXIPIME) 2 g in sodium chloride 0.9 % 100 mL IVPB  Status:  Discontinued     2 g 200 mL/hr over 30 Minutes Intravenous  Once 02/02/18 1830 02/02/18 1847   02/02/18 1845  vancomycin (VANCOCIN) IVPB 1000 mg/200 mL premix  Status:  Discontinued     1,000 mg 200 mL/hr over 60 Minutes Intravenous  Once 02/02/18 1830 02/02/18 1847   02/02/18 1500  vancomycin (VANCOCIN) IVPB 1000 mg/200 mL premix     1,000 mg 200 mL/hr over 60 Minutes Intravenous  Once 02/02/18 1445 02/02/18 1647   02/02/18 1500  piperacillin-tazobactam (ZOSYN) IVPB 3.375 g     3.375 g 100 mL/hr over 30 Minutes Intravenous  Once 02/02/18 1445 02/02/18 1546      Procedures: 5/5>> bilateral AKA  CONSULTS:  orthopedic surgery  Time spent: 25- minutes-Greater than 50% of this time was spent in counseling, explanation of diagnosis, planning of further management, and coordination of care.  MEDICATIONS: Scheduled Meds: . busPIRone  15 mg Oral TID  . docusate sodium  100 mg Oral BID  . enoxaparin (LOVENOX) injection  30 mg Subcutaneous Q24H  . escitalopram  20 mg Oral QHS  . feeding supplement (ENSURE ENLIVE)  237 mL Oral BID BM  . gabapentin  200 mg Oral BID  . multivitamin with minerals  1 tablet Oral Daily  . polyethylene glycol  17 g Oral Daily   Continuous Infusions: . methocarbamol (ROBAXIN)  IV     PRN Meds:.acetaminophen, alum & mag hydroxide-simeth, bisacodyl, diphenhydrAMINE, HYDROmorphone (DILAUDID) injection, ibuprofen, ipratropium-albuterol, magnesium citrate, methocarbamol **OR** methocarbamol (ROBAXIN)  IV, metoCLOPramide **OR** metoCLOPramide (REGLAN) injection, morphine injection, ondansetron **OR** ondansetron (ZOFRAN) IV, oxyCODONE, oxyCODONE, zolpidem   PHYSICAL EXAM: Vital  signs: Vitals:   02/27/18 2200 02/28/18 1410 02/28/18 2126 03/01/18 0456  BP: 105/60 108/60 (!) 115/59 103/66  Pulse: 67 70 62 (!) 58  Resp:  20    Temp: 98.5 F (36.9 C) 98.2 F (36.8 C) 98.5 F (36.9 C) 98.4 F (36.9 C)  TempSrc: Oral Oral Oral Oral  SpO2: 100% 100% 100% 100%  Weight:      Height:       Filed Weights   02/02/18 1636  Weight: 36.2 kg (79 lb 12.9 oz)   Body mass index is 15.08 kg/m.   Exam   Awake Alert, Oriented X 3, No new F.N deficits, Normal affect [place].AT,PERRAL Supple Neck,No JVD, No cervical lymphadenopathy appriciated.  Symmetrical Chest wall movement, Good air movement bilaterally, CTAB RRR,No Gallops, Rubs or new Murmurs, No Parasternal Heave +ve B.Sounds, Abd Soft, No tenderness, No organomegaly appriciated, No rebound - guarding or rigidity.  No Cyanosis, Clubbing or edema, No new Rash or bruise Bilateral AKA stump under bandage  I have personally reviewed following labs and imaging studies  LABORATORY DATA: CBC: Recent Labs  Lab 02/27/18 0600 02/28/18 0419  WBC 4.8 5.7  HGB 10.4* 10.6*  HCT 31.7* 32.9*  MCV 93.0 93.7  PLT 216 449    Basic Metabolic Panel: Recent Labs  Lab 02/23/18 0441 02/25/18 0624 02/27/18 0600 02/28/18 0419 03/01/18 0448  NA  --   --  137 139  --   K  --   --  4.5 4.8  --   CL  --   --  106 106  --   CO2  --   --  21* 26  --   GLUCOSE  --   --  79 99  --   BUN  --   --  15 14  --   CREATININE 0.69 0.68 0.75  0.78 0.64 0.61  CALCIUM  --   --  9.1 9.2  --     GFR: Estimated Creatinine Clearance: 45.9 mL/min (by C-G formula based on SCr of 0.61 mg/dL).  Liver Function Tests: No results for input(s): AST, ALT, ALKPHOS, BILITOT, PROT, ALBUMIN in the last 168 hours. No results for input(s): LIPASE, AMYLASE in the last 168 hours. No results for input(s): AMMONIA in the last 168 hours.  Coagulation Profile: No results for input(s): INR, PROTIME in the last 168 hours.  Cardiac Enzymes: No results for  input(s): CKTOTAL, CKMB, CKMBINDEX, TROPONINI in the last 168 hours.  BNP (last 3 results) No results for input(s): PROBNP in the last 8760 hours.  HbA1C: No results for input(s): HGBA1C in the last 72 hours.  CBG: No results for input(s): GLUCAP in the last 168 hours.  Lipid Profile: No results for input(s): CHOL, HDL, LDLCALC, TRIG, CHOLHDL, LDLDIRECT in the last 72 hours.  Thyroid Function Tests: No results for input(s): TSH, T4TOTAL, FREET4, T3FREE, THYROIDAB in the last 72 hours.  Anemia Panel: No results for input(s): VITAMINB12, FOLATE, FERRITIN, TIBC, IRON, RETICCTPCT in the last 72 hours.  Urine analysis:    Component Value Date/Time   COLORURINE AMBER (A) 01/20/2018 1020   APPEARANCEUR HAZY (A) 01/20/2018 1020   LABSPEC 1.026 01/20/2018 1020   PHURINE 5.0 01/20/2018 1020   GLUCOSEU NEGATIVE 01/20/2018 1020   HGBUR LARGE (A) 01/20/2018 1020   BILIRUBINUR NEGATIVE 01/20/2018 1020   BILIRUBINUR small 04/29/2015 1533   KETONESUR NEGATIVE 01/20/2018 1020   PROTEINUR >=300 (A) 01/20/2018 1020   UROBILINOGEN 1.0 07/26/2017 1550   NITRITE NEGATIVE 01/20/2018 1020   LEUKOCYTESUR NEGATIVE 01/20/2018 1020    Sepsis Labs: Lactic Acid, Venous    Component Value Date/Time   LATICACIDVEN 1.1 02/03/2018 0013    MICROBIOLOGY: No results found for this or any previous visit (from the past 240 hour(s)).  RADIOLOGY STUDIES/RESULTS: Dg Chest 2 View  Addendum Date: 02/02/2018   ADDENDUM REPORT: 02/02/2018 14:33 ADDENDUM: Acute findings discussed with and reconfirmed by Dr.KEVIN CAMPOS on 02/02/2018 at 2:32 pm. Electronically Signed   By: Elon Alas M.D.   On: 02/02/2018 14:33   Result Date: 02/02/2018 CLINICAL DATA:  Mid chest pain, cough and congestion. EXAM: CHEST - 2 VIEW COMPARISON:  Chest radiograph January 20, 2018 FINDINGS: Multifocal RIGHT lung consolidation. Mild chronic interstitial changes without pleural effusion. Borderline cardiomegaly. Calcified aortic knob.  No pneumothorax. Soft tissue planes included osseous structures are nonsuspicious. IMPRESSION: 1. Multifocal RIGHT pneumonia. 2. Borderline cardiomegaly. 3.  Aortic Atherosclerosis (  ICD10-I70.0). Electronically Signed: By: Elon Alas M.D. On: 02/02/2018 14:28   Dg Knee 1-2 Views Left  Result Date: 02/02/2018 CLINICAL DATA:  Knee pain.  Status post BKA. EXAM: LEFT KNEE - 1-2 VIEW COMPARISON:  01/20/2018 FINDINGS: Bones are demineralized. No evidence for an acute fracture. No subluxation or dislocation. No worrisome lytic or sclerotic osseous abnormality. No gross joint effusion evident. There is some soft tissue irregularity at the stump. IMPRESSION: Osteopenia without acute bony abnormality. Soft tissue irregularity at the stump may be postoperative scarring although wound/ulcer could have this appearance. Electronically Signed   By: Misty Stanley M.D.   On: 02/02/2018 14:30   Dg Knee 1-2 Views Right  Result Date: 02/02/2018 CLINICAL DATA:  Pain and necrosis at stump, bleeding. EXAM: RIGHT KNEE - 1-2 VIEW COMPARISON:  RIGHT knee radiograph January 20, 2018 FINDINGS: Status post below-knee amputation. No acute osseous process. No rib fracture deformity or dislocation. No advanced degenerative change for age. Increasing soft tissue swelling with superficial irregularity and subcutaneous gas about the stump. IMPRESSION: Soft tissue swelling with new subcutaneous gas seen with necrotizing fasciitis or recent instrumentation. No acute osseous process. Acute findings discussed with and reconfirmed by Dr.KEVIN CAMPOS on 02/02/2018 at 2:32 pm. Electronically Signed   By: Elon Alas M.D.   On: 02/02/2018 14:32   Ct Head Wo Contrast  Result Date: 02/16/2018 CLINICAL DATA:  Pain following fall EXAM: CT HEAD WITHOUT CONTRAST CT CERVICAL SPINE WITHOUT CONTRAST TECHNIQUE: Multidetector CT imaging of the head and cervical spine was performed following the standard protocol without intravenous contrast.  Multiplanar CT image reconstructions of the cervical spine were also generated. COMPARISON:  Head CT June 08, 2009 FINDINGS: CT HEAD FINDINGS Brain: The ventricles are normal in size and configuration. There is no intracranial mass, hemorrhage, extra-axial fluid collection, or midline shift. Gray-white compartments appear normal. No evident acute infarct. Vascular: There is no hyperdense vessel. There is calcification in each carotid siphon region. Skull: Bony calvarium appears intact. There is a right parietal scalp hematoma. Sinuses/Orbits: There is mucosal thickening in several ethmoid air cells. Other visualized paranasal sinuses are clear. Visualized orbits appear symmetric bilaterally. Note that there is increased attenuation in each globe which potentially may be artifactual. Other: Mastoid air cells are clear. CT CERVICAL SPINE FINDINGS Alignment: There is no appreciable spondylolisthesis. Skull base and vertebrae: Skull base and craniocervical junction regions appear normal. No evident fracture. No blastic or lytic bone lesions. Soft tissues and spinal canal: Prevertebral soft tissues and predental space regions are normal. No paraspinous lesions evident. No cord canal hematoma appreciable. Disc levels: There is moderate disc space narrowing at C5-6 and C6-7. There are anterior osteophytes at C5 and C6. There is a calcified central and left paracentral disc protrusion at C6-7 with mild impression on the ventral cord at this level. There is mild facet hypertrophy at several levels. Upper chest: Visualized upper lung zones are clear. There is a degree of underlying centrilobular emphysematous change. Other: There are foci of calcification in each carotid and subclavian artery. IMPRESSION: CT head: 1. No mass or hemorrhage. Gray-white compartments appear normal. Note that there is a right parietal scalp hematoma. No fracture evident. 2.  Foci of arterial vascular calcification noted. 3.  Mucosal thickening  in several ethmoid air cells. 4. Increased attenuation in both globes may well be artifactual. If patient is reporting vision difficulty, ophthalmological examination in this regard may be reasonable. CT cervical spine: No fracture or spondylolisthesis. Arthropathy at several levels  with a calcified central and left paracentral disc protrusion at C6-7. No high-grade stenosis. Foci of calcification in subclavian and carotid arteries noted. Underlying centrilobular emphysematous change. Emphysema (ICD10-J43.9). Electronically Signed   By: Lowella Grip III M.D.   On: 02/16/2018 13:18   Ct Cervical Spine Wo Contrast  Result Date: 02/16/2018 CLINICAL DATA:  Pain following fall EXAM: CT HEAD WITHOUT CONTRAST CT CERVICAL SPINE WITHOUT CONTRAST TECHNIQUE: Multidetector CT imaging of the head and cervical spine was performed following the standard protocol without intravenous contrast. Multiplanar CT image reconstructions of the cervical spine were also generated. COMPARISON:  Head CT June 08, 2009 FINDINGS: CT HEAD FINDINGS Brain: The ventricles are normal in size and configuration. There is no intracranial mass, hemorrhage, extra-axial fluid collection, or midline shift. Gray-white compartments appear normal. No evident acute infarct. Vascular: There is no hyperdense vessel. There is calcification in each carotid siphon region. Skull: Bony calvarium appears intact. There is a right parietal scalp hematoma. Sinuses/Orbits: There is mucosal thickening in several ethmoid air cells. Other visualized paranasal sinuses are clear. Visualized orbits appear symmetric bilaterally. Note that there is increased attenuation in each globe which potentially may be artifactual. Other: Mastoid air cells are clear. CT CERVICAL SPINE FINDINGS Alignment: There is no appreciable spondylolisthesis. Skull base and vertebrae: Skull base and craniocervical junction regions appear normal. No evident fracture. No blastic or lytic bone  lesions. Soft tissues and spinal canal: Prevertebral soft tissues and predental space regions are normal. No paraspinous lesions evident. No cord canal hematoma appreciable. Disc levels: There is moderate disc space narrowing at C5-6 and C6-7. There are anterior osteophytes at C5 and C6. There is a calcified central and left paracentral disc protrusion at C6-7 with mild impression on the ventral cord at this level. There is mild facet hypertrophy at several levels. Upper chest: Visualized upper lung zones are clear. There is a degree of underlying centrilobular emphysematous change. Other: There are foci of calcification in each carotid and subclavian artery. IMPRESSION: CT head: 1. No mass or hemorrhage. Gray-white compartments appear normal. Note that there is a right parietal scalp hematoma. No fracture evident. 2.  Foci of arterial vascular calcification noted. 3.  Mucosal thickening in several ethmoid air cells. 4. Increased attenuation in both globes may well be artifactual. If patient is reporting vision difficulty, ophthalmological examination in this regard may be reasonable. CT cervical spine: No fracture or spondylolisthesis. Arthropathy at several levels with a calcified central and left paracentral disc protrusion at C6-7. No high-grade stenosis. Foci of calcification in subclavian and carotid arteries noted. Underlying centrilobular emphysematous change. Emphysema (ICD10-J43.9). Electronically Signed   By: Lowella Grip III M.D.   On: 02/16/2018 13:18     LOS: 27 days   Signature  Lala Lund M.D on 03/01/2018 at 11:10 AM  Between 7am to 7pm - Pager - [phone] ( page via [email or web address], text pages only, please mention full 10 digit call back number).  After 7pm go to [email or web address] - password [facility]

## 2018-03-02 LAB — CREATININE, SERUM
Creatinine, Ser: 0.55 mg/dL (ref 0.44–1.00)
GFR calc Af Amer: >60 mL/min (ref >60)
GFR calc non Af Amer: >60 mL/min (ref >60)

## 2018-03-02 MED ORDER — GABAPENTIN 100 MG PO CAPS: 200.0000 mg | ORAL_CAPSULE | Freq: Two times a day (BID) | ORAL | 0 refills | Status: DC

## 2018-03-02 MED ORDER — BUSPIRONE HCL 15 MG PO TABS: 15.0000 mg | ORAL_TABLET | Freq: Three times a day (TID) | ORAL | 0 refills | Status: DC

## 2018-03-02 MED ORDER — IBUPROFEN 200 MG PO TABS: 400.0000 mg | ORAL_TABLET | Freq: Four times a day (QID) | ORAL | 0 refills | Status: DC | PRN

## 2018-03-02 MED ORDER — ESCITALOPRAM OXALATE 10 MG PO TABS
20.0000 mg | ORAL_TABLET | Freq: Every day | ORAL | 0 refills | Status: DC
Start: 2018-03-02 — End: 2018-05-20

## 2018-03-02 NOTE — Plan of Care (Signed)
  Problem: Spiritual Needs Goal: Ability to function at adequate level Outcome: Progressing   Problem: Clinical Measurements: Goal: Ability to maintain clinical measurements within normal limits will improve Outcome: Progressing Goal: Will remain free from infection Outcome: Progressing Goal: Diagnostic test results will improve Outcome: Progressing Goal: Respiratory complications will improve Outcome: Progressing Goal: Cardiovascular complication will be avoided Outcome: Progressing   Problem: Activity: Goal: Risk for activity intolerance will decrease Outcome: Progressing   Problem: Nutrition: Goal: Adequate nutrition will be maintained Outcome: Progressing   Problem: Coping: Goal: Level of anxiety will decrease Outcome: Progressing   Problem: Elimination: Goal: Will not experience complications related to bowel motility Outcome: Progressing Goal: Will not experience complications related to urinary retention Outcome: Progressing   Problem: Safety: Goal: Ability to remain free from injury will improve Outcome: Progressing   Problem: Skin Integrity: Goal: Risk for impaired skin integrity will decrease Outcome: Progressing   Problem: Activity: Goal: Ability to perform//tolerate increased activity and mobilize with assistive devices will improve Outcome: Progressing   Problem: Clinical Measurements: Goal: Postoperative complications will be avoided or minimized Outcome: Progressing   Problem: Self-Care: Goal: Ability to meet self-care needs will improve Outcome: Progressing   Problem: Self-Concept: Goal: Ability to maintain and perform role responsibilities to the fullest extent possible will improve Outcome: Progressing   Problem: Skin Integrity: Goal: Demonstration of wound healing without infection will improve Outcome: Progressing

## 2018-03-02 NOTE — Care Management Note (Signed)
Case Management Note  Patient Details  Name: [name] MRN: [id number] Date of Birth: 04/29/1961  Subjective/Objective:  57 yr old female that came in for revision of BKA's to  bilateral AKA's. Patient's stay was prolonged waiting for placement.                  Action/Plan: Case manager has arranged followup appointment for patient at [facility] for Thursday March 31, 2018 at 1:50pm. Case manager informed clerk that patient needs follow up lab work and they agreed to place her on the cancellation list.     Expected Discharge Date:  03/02/18              Expected Discharge Plan:  Home/Self Care  In-House Referral:     Discharge planning Services  CM Consult, Follow-up appt scheduled  Post Acute Care Choice:  NA Choice offered to:     DME Arranged:  (has wheelchair) DME Agency:     HH Arranged:    [place] Agency:  (patient will resume care with services provided through the county)  Status of Service:  Completed, signed off  If discussed at [employer] of Stay Meetings, dates discussed:    Additional Comments:  Ninfa Meeker, RN 03/02/2018, 4:16 PM

## 2018-03-02 NOTE — Discharge Instructions (Signed)

## 2018-03-02 NOTE — Progress Notes (Signed)
Discharge instructions (including medications) discussed with and copy provided to patient/caregiver. Pt awaiting [facility] pick-up.

## 2018-03-02 NOTE — Progress Notes (Signed)
D/C order is in and [facility] was called to pick up patient.

## 2018-03-02 NOTE — Social Work (Addendum)
CSW Surveyor, quantity has f/u with [facility] in regards to taking pt wheelchair, they are able to take therefore pt can discharge back to home with [facility] and her wheelchair. CSW to page MD.   2:24pm- MD aware, plan is for discharge this afternoon, pt has key and pt brother [name] has been called. A HIPPA compliant voice message was left for him on # [phone].  2:37pm- [facility] paperwork given to bedside RN Ginger with [facility] number to be called when discharge summary available. Pt discharging back to: [place], [place] [id number]  CSW signing off. Please consult if any additional needs arise.  [place], [place] Work [phone]

## 2018-03-02 NOTE — Discharge Summary (Addendum)
Physician Discharge Summary  [name] WNI:[id number] DOB: 09-15-1961  PCP: No primary care provider on file.  Admit date: 02/02/2018 Discharge date: 03/02/2018  Recommendations for Outpatient Follow-up:  1. [address], new PCP on 03/31/2018 at 1:50 PM.  To be seen with repeat labs (CBC & BMP).  Patient is on wait list for a cancellation and may be called in for an earlier appointment. 2. Dr. Meridee Score, Orthopedics for postop follow-up in 1 week. 3. Recommending repeating chest x-ray in 4 weeks to ensure resolution of pneumonia findings.  Home Health: None Equipment/Devices: Wheelchair  Discharge Condition: Improved and stable CODE STATUS: Full Diet recommendation: Healthy diet.  Discharge Diagnoses:  Principal Problem:   Cocaine use disorder, severe, dependence (HCC) Active Problems:   Essential hypertension   Cocaine-induced vascular disorder (HCC)   MDD (major depressive disorder), recurrent episode, severe (HCC)   Protein-calorie malnutrition, severe (HCC)   Atherosclerosis of native arteries of extremities with gangrene, bilateral legs (HCC)   Suicidal ideation   HCAP (healthcare-associated pneumonia)   Acute on chronic respiratory failure with hypoxia (HCC)   Sepsis ([place])   Brief Summary: 57 year old female with PMH of cocaine use, HTN, bilateral BKA was admitted with dyspnea and found to have pneumonia and bilateral BKA stump infections.  She was evaluated by orthopedics and underwent bilateral AKA.  Assessment and plan:  1. Sepsis secondary to healthcare associated pneumonia and bilateral BKA stump infection with gangrene: She completed course of antibiotics on 02/13/2018.  Sepsis resolved.  Cultures negative. 2. 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  Has been off of wound VAC.  All staples removed prior to discharge on 5/29.  Phantom pain adequately controlled on current dose of  Neurontin. 3. Mechanical fall: Patient fell from wheelchair on 5/15.  CT head and neck negative for abnormalities.  Sustained small scalp hematoma in the right parietal area. 4. Anemia: Multifactorial secondary to acute illness.  Did get transfused total of 3 units of PRBCs during this admission.  Hemoglobin stable in the 10 g range. 5. Essential hypertension: Was not on any medications prior to admission.  Blood pressure stable. 6. Polysubstance abuse/cocaine use: Cessation counseled.  As per case management, patient's brother is assisting patient with getting into a rehab post discharge. 7. Severe protein calorie malnutrition: Outpatient diet as tolerated. 8. Major depressive disorder: Early on in admission she had suicidal ideations.  Psychiatry was consulted and she endorsed suicidal ideations in the setting of pain which was better managed.  She has no further ongoing suicidal ideations, homicidal ideations or audiovisual hallucinations.  Psychiatry determined that she did not warrant inpatient psychiatric hospitalization at this time.  She has been continued on BuSpar and Lexapro. 9. Acute respiratory failure with hypoxia: Resolved.  May have underlying COPD. 10. Chronic diastolic CHF: Compensated without diuretics.   Consultations:  Orthopedics  Procedures:  Bilateral BKA   Discharge Instructions  Discharge Instructions    Call MD for:  difficulty breathing, headache or visual disturbances   Complete by:  As directed    Call MD for:  extreme fatigue   Complete by:  As directed    Call MD for:  persistant dizziness or light-headedness   Complete by:  As directed    Call MD for:  persistant nausea and vomiting   Complete by:  As directed    Call MD for:  redness, tenderness, or signs of infection (pain, swelling, redness, odor or green/yellow discharge  around incision site)   Complete by:  As directed    Call MD for:  severe uncontrolled pain   Complete by:  As directed     Call MD for:  temperature >100.4   Complete by:  As directed    Diet - low sodium heart healthy   Complete by:  As directed    Increase activity slowly   Complete by:  As directed        Medication List    STOP taking these medications   amLODipine 10 MG tablet Commonly known as:  NORVASC   cyclobenzaprine 5 MG tablet Commonly known as:  FLEXERIL   doxycycline 100 MG capsule Commonly known as:  VIBRAMYCIN   gabapentin 600 MG tablet Commonly known as:  NEURONTIN Replaced by:  gabapentin 100 MG capsule   hydrochlorothiazide 25 MG tablet Commonly known as:  HYDRODIURIL   HYDROcodone-ibuprofen 7.5-200 MG tablet Commonly known as:  VICOPROFEN   naproxen sodium 220 MG tablet Commonly known as:  ALEVE   potassium chloride 10 MEQ tablet Commonly known as:  K-DUR     TAKE these medications   busPIRone 15 MG tablet Commonly known as:  BUSPAR Take 1 tablet (15 mg total) by mouth 3 (three) times daily.   escitalopram 10 MG tablet Commonly known as:  LEXAPRO Take 2 tablets (20 mg total) by mouth at bedtime.   gabapentin 100 MG capsule Commonly known as:  NEURONTIN Take 2 capsules (200 mg total) by mouth 2 (two) times daily. Replaces:  gabapentin 600 MG tablet   ibuprofen 200 MG tablet Commonly known as:  ADVIL,MOTRIN Take 2 tablets (400 mg total) by mouth every 6 (six) hours as needed for fever, mild pain or moderate pain. What changed:    how much to take  when to take this  reasons to take this      Follow-up Information    Newt Minion, MD Follow up in 1 week(s).   Specialty:  Orthopedic Surgery Contact information: [place] [address] [phone]        [place]. Go to.   Why:  Your appointment is scheduled for March 31, 2018 at 1:50pm. To be seen with repeat labs (CBC & BMP).  **you are on the wait list for a cancellation, you may receive a call to come in for [facility] earlier  appointment- Contact information: [place] [address] [phone]         Allergies  Allergen Reactions  . Acetaminophen Swelling and Other (See Comments)    Reaction:  Eyelid swelling  . Lisinopril     UNSPECIFIED REACTION       Procedures/Studies: Dg Chest 2 View  Addendum Date: 02/02/2018   ADDENDUM REPORT: 02/02/2018 14:33 ADDENDUM: Acute findings discussed with and reconfirmed by Dr.KEVIN CAMPOS on 02/02/2018 at 2:32 pm. Electronically Signed   By: Elon Alas M.D.   On: 02/02/2018 14:33   Result Date: 02/02/2018 CLINICAL DATA:  Mid chest pain, cough and congestion. EXAM: CHEST - 2 VIEW COMPARISON:  Chest radiograph January 20, 2018 FINDINGS: Multifocal RIGHT lung consolidation. Mild chronic interstitial changes without pleural effusion. Borderline cardiomegaly. Calcified aortic knob. No pneumothorax. Soft tissue planes included osseous structures are nonsuspicious. IMPRESSION: 1. Multifocal RIGHT pneumonia. 2. Borderline cardiomegaly. 3.  Aortic Atherosclerosis (ICD10-I70.0). Electronically Signed: By: Elon Alas M.D. On: 02/02/2018 14:28   Dg Knee 1-2 Views Left  Result Date: 02/02/2018 CLINICAL DATA:  Knee pain.  Status post BKA. EXAM: LEFT KNEE - 1-2 VIEW COMPARISON:  01/20/2018 FINDINGS: Bones are demineralized. No evidence for an acute fracture. No subluxation or dislocation. No worrisome lytic or sclerotic osseous abnormality. No gross joint effusion evident. There is some soft tissue irregularity at the stump. IMPRESSION: Osteopenia without acute bony abnormality. Soft tissue irregularity at the stump may be postoperative scarring although wound/ulcer could have this appearance. Electronically Signed   By: Misty Stanley M.D.   On: 02/02/2018 14:30   Dg Knee 1-2 Views Right  Result Date: 02/02/2018 CLINICAL DATA:  Pain and necrosis at stump, bleeding. EXAM: RIGHT KNEE - 1-2 VIEW COMPARISON:  RIGHT knee radiograph January 20, 2018 FINDINGS: Status post below-knee amputation. No acute osseous process. No rib fracture deformity or dislocation. No advanced degenerative change for age. Increasing soft tissue swelling with superficial irregularity and subcutaneous gas about the stump. IMPRESSION: Soft tissue swelling with new subcutaneous gas seen with necrotizing fasciitis or recent instrumentation. No acute osseous process. Acute findings discussed with and reconfirmed by Dr.KEVIN CAMPOS on 02/02/2018 at 2:32 pm. Electronically Signed   By: Elon Alas M.D.   On: 02/02/2018 14:32   Ct Head Wo Contrast  Result Date: 02/16/2018 CLINICAL DATA:  Pain following fall EXAM: CT HEAD WITHOUT CONTRAST CT CERVICAL SPINE WITHOUT CONTRAST TECHNIQUE: Multidetector CT imaging of the head and cervical spine was performed following the standard protocol without intravenous contrast. Multiplanar CT image reconstructions of the cervical spine were also generated. COMPARISON:  Head CT June 08, 2009 FINDINGS: CT HEAD FINDINGS Brain: The ventricles are normal in size and configuration. There is no intracranial mass, hemorrhage, extra-axial fluid collection, or midline shift. Gray-white compartments appear normal. No evident acute infarct. Vascular: There is no hyperdense vessel. There is calcification in each carotid siphon region. Skull: Bony calvarium appears intact. There is a right parietal scalp hematoma. Sinuses/Orbits: There is mucosal thickening in several ethmoid air cells. Other visualized paranasal sinuses are clear. Visualized orbits appear symmetric bilaterally. Note that there is increased attenuation in each globe which potentially may be artifactual. Other: Mastoid air cells are clear. CT CERVICAL SPINE FINDINGS Alignment: There is no appreciable spondylolisthesis. Skull base and vertebrae: Skull base and craniocervical junction regions appear normal. No evident fracture. No blastic or lytic bone lesions. Soft tissues and spinal  canal: Prevertebral soft tissues and predental space regions are normal. No paraspinous lesions evident. No cord canal hematoma appreciable. Disc levels: There is moderate disc space narrowing at C5-6 and C6-7. There are anterior osteophytes at C5 and C6. There is a calcified central and left paracentral disc protrusion at C6-7 with mild impression on the ventral cord at this level. There is mild facet hypertrophy at several levels. Upper chest: Visualized upper lung zones are clear. There is a degree of underlying centrilobular emphysematous change. Other: There are foci of calcification in each carotid and subclavian artery. IMPRESSION: CT head: 1. No mass or hemorrhage. Gray-white compartments appear normal. Note that there is a right parietal scalp hematoma. No fracture evident. 2.  Foci of arterial vascular calcification noted. 3.  Mucosal thickening in several ethmoid air cells. 4. Increased attenuation in both globes may well be artifactual. If patient is reporting vision difficulty, ophthalmological examination in this regard may be reasonable. CT cervical spine: No fracture or spondylolisthesis. Arthropathy at several levels with a calcified central and left paracentral disc protrusion at C6-7. No high-grade stenosis. Foci of calcification in subclavian and carotid arteries noted. Underlying centrilobular  emphysematous change. Emphysema (ICD10-J43.9). Electronically Signed   By: Lowella Grip III M.D.   On: 02/16/2018 13:18   Ct Cervical Spine Wo Contrast  Result Date: 02/16/2018 CLINICAL DATA:  Pain following fall EXAM: CT HEAD WITHOUT CONTRAST CT CERVICAL SPINE WITHOUT CONTRAST TECHNIQUE: Multidetector CT imaging of the head and cervical spine was performed following the standard protocol without intravenous contrast. Multiplanar CT image reconstructions of the cervical spine were also generated. COMPARISON:  Head CT June 08, 2009 FINDINGS: CT HEAD FINDINGS Brain: The ventricles are normal in  size and configuration. There is no intracranial mass, hemorrhage, extra-axial fluid collection, or midline shift. Gray-white compartments appear normal. No evident acute infarct. Vascular: There is no hyperdense vessel. There is calcification in each carotid siphon region. Skull: Bony calvarium appears intact. There is a right parietal scalp hematoma. Sinuses/Orbits: There is mucosal thickening in several ethmoid air cells. Other visualized paranasal sinuses are clear. Visualized orbits appear symmetric bilaterally. Note that there is increased attenuation in each globe which potentially may be artifactual. Other: Mastoid air cells are clear. CT CERVICAL SPINE FINDINGS Alignment: There is no appreciable spondylolisthesis. Skull base and vertebrae: Skull base and craniocervical junction regions appear normal. No evident fracture. No blastic or lytic bone lesions. Soft tissues and spinal canal: Prevertebral soft tissues and predental space regions are normal. No paraspinous lesions evident. No cord canal hematoma appreciable. Disc levels: There is moderate disc space narrowing at C5-6 and C6-7. There are anterior osteophytes at C5 and C6. There is a calcified central and left paracentral disc protrusion at C6-7 with mild impression on the ventral cord at this level. There is mild facet hypertrophy at several levels. Upper chest: Visualized upper lung zones are clear. There is a degree of underlying centrilobular emphysematous change. Other: There are foci of calcification in each carotid and subclavian artery. IMPRESSION: CT head: 1. No mass or hemorrhage. Gray-white compartments appear normal. Note that there is a right parietal scalp hematoma. No fracture evident. 2.  Foci of arterial vascular calcification noted. 3.  Mucosal thickening in several ethmoid air cells. 4. Increased attenuation in both globes may well be artifactual. If patient is reporting vision difficulty, ophthalmological examination in this regard  may be reasonable. CT cervical spine: No fracture or spondylolisthesis. Arthropathy at several levels with a calcified central and left paracentral disc protrusion at C6-7. No high-grade stenosis. Foci of calcification in subclavian and carotid arteries noted. Underlying centrilobular emphysematous change. Emphysema (ICD10-J43.9). Electronically Signed   By: Lowella Grip III M.D.   On: 02/16/2018 13:18      Subjective: Patient interviewed and examined in the presence of her RN this morning.  She denied complaints.  No pain reported.  Confirmed with patient and RN that patient able to independently transfer from bed to wheelchair and move around with that.  All staples removed today.  No fever or chills.  As per extensive discussion with clinical social work and case management, unable to get patient placed at a nursing facility and hence arrangements were made by social work with patient to return home with her brother who is also trying to arrange for her to go into outpatient rehab.  Discharge Exam:  Vitals:   03/01/18 0456 03/01/18 1428 03/01/18 2033 03/02/18 0530  BP: 103/66 (!) 90/58 106/76 120/73  Pulse: (!) 58 72 61 (!) 54  Resp:  18    Temp: 98.4 F (36.9 C) 98.6 F (37 C) 98.3 F (36.8 C) (!) 97.5 F (36.4 C)  TempSrc: Oral Oral Oral Oral  SpO2: 100% 100% 100% 100%  Weight:      Height:        General: Pleasant young female, small built, thinly nourished, lying comfortably propped up in bed without distress.  Oral mucosa moist. Cardiovascular: S1 & S2 heard, RRR, S1/S2 +. No murmurs, rubs, gallops or clicks. No JVD or pedal edema. Respiratory: Clear to auscultation without wheezing, rhonchi or crackles. No increased work of breathing. Abdominal:  Non distended, non tender & soft. No organomegaly or masses appreciated. Normal bowel sounds heard. CNS: Alert and oriented. No focal deficits. Extremities: no edema, no cyanosis.  Bilateral AKA stump dressings clean and dry.   Symmetric 5 x 5 power.    The results of significant diagnostics from this hospitalization (including imaging, microbiology, ancillary and laboratory) are listed below for reference.     Microbiology: No results found for this or any previous visit (from the past 240 hour(s)).   Labs: CBC: Recent Labs  Lab 02/27/18 0600 02/28/18 0419  WBC 4.8 5.7  HGB 10.4* 10.6*  HCT 31.7* 32.9*  MCV 93.0 93.7  PLT 216 683   Basic Metabolic Panel: Recent Labs  Lab 02/25/18 0624 02/27/18 0600 02/28/18 0419 03/01/18 0448 03/02/18 0534  NA  --  137 139  --   --   K  --  4.5 4.8  --   --   CL  --  106 106  --   --   CO2  --  21* 26  --   --   GLUCOSE  --  79 99  --   --   BUN  --  15 14  --   --   CREATININE 0.68 0.75  0.78 0.64 0.61 0.55  CALCIUM  --  9.1 9.2  --   --         Time coordinating discharge: 40 minutes  SIGNED:  Vernell Leep, MD, FACP, [facility]. [facility] Pager [phone]  If 7PM-7AM, please contact night-coverage [email or web address] Password [facility] 03/02/2018, 4:22 PM

## 2018-03-31 ENCOUNTER — Ambulatory Visit (INDEPENDENT_AMBULATORY_CARE_PROVIDER_SITE_OTHER): Payer: Self-pay | Admitting: Physician Assistant

## 2018-04-01 ENCOUNTER — Emergency Department (HOSPITAL_COMMUNITY)
Admission: EM | Admit: 2018-04-01 | Discharge: 2018-04-01 | Disposition: A | Discharge status: Home / Self Care | Payer: Medicaid Other | Source: Home / Self Care | Attending: Emergency Medicine | Admitting: Emergency Medicine

## 2018-04-01 ENCOUNTER — Emergency Department (HOSPITAL_COMMUNITY)
Admission: EM | Admit: 2018-04-01 | Discharge: 2018-04-01 | Disposition: A | Discharge status: Home / Self Care | Payer: Medicaid Other | Attending: Emergency Medicine | Admitting: Emergency Medicine

## 2018-04-01 ENCOUNTER — Encounter (HOSPITAL_COMMUNITY): Payer: Self-pay | Admitting: Emergency Medicine

## 2018-04-01 DIAGNOSIS — I10 Essential (primary) hypertension: Secondary | ICD-10-CM | POA: Insufficient documentation

## 2018-04-01 DIAGNOSIS — F17210 Nicotine dependence, cigarettes, uncomplicated: Secondary | ICD-10-CM | POA: Insufficient documentation

## 2018-04-01 DIAGNOSIS — T8789 Other complications of amputation stump: Secondary | ICD-10-CM | POA: Insufficient documentation

## 2018-04-01 DIAGNOSIS — Y829 Unspecified medical devices associated with adverse incidents: Secondary | ICD-10-CM | POA: Diagnosis not present

## 2018-04-01 DIAGNOSIS — Z79899 Other long term (current) drug therapy: Secondary | ICD-10-CM | POA: Diagnosis not present

## 2018-04-01 DIAGNOSIS — M79609 Pain in unspecified limb: Secondary | ICD-10-CM

## 2018-04-01 DIAGNOSIS — Z5900 Homelessness unspecified: Secondary | ICD-10-CM

## 2018-04-01 MED ORDER — GABAPENTIN 100 MG PO CAPS: 200.0000 mg | ORAL_CAPSULE | Freq: Two times a day (BID) | ORAL | 0 refills | Status: DC

## 2018-04-01 NOTE — ED Provider Notes (Signed)
[facility] EMERGENCY DEPARTMENT Provider Note   CSN: [id number] Arrival date & time: 04/01/18  4098     History   Chief Complaint Chief Complaint  Patient presents with  . Leg Pain    HPI [name] is a 57 y.o. female.  HPI   [name] is a 56 y.o. female, with a history of cocaine abuse, HTN, and recent bilateral AKA, presenting to the ED with bilateral leg pain.  Patient underwent bilateral AKA Feb 06, 2018.  She has had phantom pains since that time.  She was prescribed Neurontin 200 mg twice daily at hospital discharge.  She has since run out.  She has follow-up with her orthopedic surgeon, Dr. Sharol Given, in about 2 weeks. Denies numbness, fever, swelling, color change, or any other complaints.     Past Medical History:  Diagnosis Date  . CAP (community acquired pneumonia) 03/2014 X 2  . Cocaine abuse ([place])    ongoing with resultant vaculitis.  . Depression   . Headache    "weekly" (07/29/2016)  . Hypertension   . Inflammatory arthritis   . Migraines    "probably 5-6/yr" (07/29/2016)  . Normocytic anemia    BL Hgb 9.8-12. Last anemia panel 04/2010 - showing Fe 19, ferritin 101.  Pt on monthly B12 injections  . Rheumatoid arthritis(714.0)    patient reported  . 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 Followed by Dr. Louanne Skye    Patient Active Problem List   Diagnosis Date Noted  . Suicidal ideation 02/02/2018  . HCAP (healthcare-associated pneumonia) 02/02/2018  . Acute on chronic respiratory failure with hypoxia ([place]) 02/02/2018  . Sepsis ([place]) 02/02/2018  . Ulcer of amputation stump of lower extremity ([place]) 10/23/2017  . 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 Hypertensive crisis   . Phantom limb pain ([place])   . S/P bilateral below knee amputation ([place]) 04/13/2017  . Tobacco abuse   . Post-operative pain   . Acute blood loss anemia   .  Atherosclerosis of native arteries of extremities with gangrene, bilateral legs ([place])   . Wound infection 03/30/2017  . AKI (acute kidney injury) ([place]) 02/07/2017  . Acute kidney injury ([place]) 02/06/2017  . Wound healing, delayed   . Chest pain 04/07/2015  . Protein-calorie malnutrition, severe ([place]) 01/15/2015  . MDD (major depressive disorder), recurrent episode, severe ([place]) 10/16/2014  . Cocaine use disorder, severe, dependence ([place]) 10/10/2014  . 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 Essential hypertension 02/26/2010    Past Surgical History:  Procedure Laterality Date  . AMPUTATION Left 05/22/2016   Procedure: AMPUTATION LEFT LONG FINGER;  Surgeon: Marybelle Killings, MD;  Location: [facility];  Service: Orthopedics;  Laterality: Left;  . AMPUTATION Bilateral 04/10/2017   Procedure: AMPUTATION BELOW KNEE;  Surgeon: Newt Minion, MD;  Location: [facility];  Service: Orthopedics;  Laterality: Bilateral;  . AMPUTATION Bilateral 02/06/2018   Procedure: AMPUTATION ABOVE KNEE;  Surgeon: Newt Minion, MD;  Location: [facility];  Service: Orthopedics;  Laterality: Bilateral;  . HERNIA REPAIR     "stomach"  . I&D EXTREMITY Right 09/26/2015   Procedure: IRRIGATION AND DEBRIDEMENT LEG WOUND  VAC PLACEMENT.;  Surgeon: Loel Lofty Dillingham, DO;  Location: [facility];  Service: Plastics;  Laterality: Right;  . INCISION AND DRAINAGE OF WOUND Bilateral 10/20/2016   Procedure: IRRIGATION AND DEBRIDEMENT WOUND BILATERAL;  Surgeon: Edrick Kins, DPM;  Location: [facility];  Service: Podiatry;  Laterality: Bilateral;  . IRRIGATION AND DEBRIDEMENT ABSCESS Bilateral 09/26/2013   Procedure: DEBRIDEMENT ULCERS BILATERAL THIGHS;  Surgeon: Gwenyth Ober, MD;  Location: [facility];  Service: General;  Laterality: Bilateral;  . SKIN BIOPSY Bilateral    shin nodules     OB History   None      Home Medications    Prior to Admission medications   Medication Sig Start Date End Date Taking? Authorizing Provider  busPIRone  (BUSPAR) 15 MG tablet Take 1 tablet (15 mg total) by mouth 3 (three) times daily. 03/02/18   Hongalgi, Lenis Dickinson, MD  escitalopram (LEXAPRO) 10 MG tablet Take 2 tablets (20 mg total) by mouth at bedtime. 03/02/18   Hongalgi, Lenis Dickinson, MD  gabapentin (NEURONTIN) 100 MG capsule Take 2 capsules (200 mg total) by mouth 2 (two) times daily. 03/02/18   Hongalgi, Lenis Dickinson, MD  gabapentin (NEURONTIN) 100 MG capsule Take 2 capsules (200 mg total) by mouth 2 (two) times daily for 15 days. 04/01/18 04/16/18  Sheniqua Carolan C, PA-C  ibuprofen (ADVIL,MOTRIN) 200 MG tablet Take 2 tablets (400 mg total) by mouth every 6 (six) hours as needed for fever, mild pain or moderate pain. 03/02/18   Hongalgi, Lenis Dickinson, MD    Family History Family History  Problem Relation Age of Onset  . Breast cancer Mother        Breast cancer  . Alcohol abuse Mother   . Colon cancer Maternal Aunt 19  . Alcohol abuse Father     Social History Social History   Tobacco Use  . Smoking status: Current Every Day Smoker    Packs/day: 0.12    Years: 38.00    Pack years: 4.56    Types: Cigarettes  . Smokeless tobacco: Never Used  . Tobacco comment: 2 A DAY  Substance Use Topics  . Alcohol use: No    Alcohol/week: 0.0 oz  . Drug use: Yes    Types: "Crack" cocaine, Cocaine    Comment: Smoked crack 06/02/2017     Allergies   Acetaminophen and Lisinopril   Review of Systems Review of Systems  Constitutional: Negative for fever.  Musculoskeletal: Positive for myalgias.  Skin: Negative for color change.  Neurological: Negative for numbness.     Physical Exam Updated Vital Signs BP (!) 148/95 (BP Location: Right Arm)   Pulse 94   Temp 97.9 F (36.6 C) (Oral)   Resp 16   SpO2 98%   Physical Exam  Constitutional: She appears well-developed and well-nourished. No distress.  HENT:  Head: Normocephalic and atraumatic.  Eyes: Conjunctivae are normal.  Neck: Neck supple.  Cardiovascular: Normal rate and regular rhythm.    Pulmonary/Chest: Effort normal.  Musculoskeletal: She exhibits no edema.  Some sensitivity to palpation to the bilateral AKA sites.  No noted swelling, erythema, dehiscence of the incision sites, drainage, or other abnormality noted.  Wounds appear to be healing well.  There appears to be good circulation to the distal ends of the amputation sites.  Neurological: She is alert.  Sensation intact in the distal amputation stumps.  Skin: Skin is warm and dry. She is not diaphoretic. No erythema. No pallor.  Psychiatric: She has a normal mood and affect. Her behavior is normal.  Nursing note and vitals reviewed.    ED Treatments / Results  Labs (all labs ordered are listed, but only abnormal results are displayed) Labs Reviewed - No data to display  EKG None  Radiology No results found.  Procedures Procedures (including critical care time)  Medications Ordered in ED Medications - No data to display   Initial Impression / Assessment and Plan / ED Course  I have reviewed the triage vital signs and the nursing notes.  Pertinent labs & imaging results that were available during my care of the patient were reviewed by me and considered in my medical decision making (see chart for details).     Patient presents with continued phantom pain to the bilateral lower extremities following AKA beginning of May.  No noted signs of dehiscence or infection.  Patient's routine cocaine use may be contributory, however, there appears to be good circulation to the distal incision sites.  She has follow-up with her orthopedist in 2 weeks.  We will renew the Neurontin prescription that will cover the patient until she can follow-up with her orthopedist. The patient was given instructions for home care as well as return precautions. Patient voices understanding of these instructions, accepts the plan, and is comfortable with discharge.  Final Clinical Impressions(s) / ED Diagnoses   Final diagnoses:   Amputation stump pain [facility])    ED Discharge Orders        Ordered    gabapentin (NEURONTIN) 100 MG capsule  2 times daily     04/01/18 [place], Jazon Jipson C, PA-C 04/01/18 [place], April, MD 04/01/18 [id number]

## 2018-04-01 NOTE — ED Triage Notes (Signed)
BIB PTAR from a bus stop reporting "phantom pain" to her bilateral BKA sites. Also reports "side pain" X several weeks.

## 2018-04-01 NOTE — ED Notes (Signed)
Patient brought over by [facility] RN.  This tech was instructed to bathe patient prior to discharge.  Patient given basin with warm water, wash cloths, towel, soap, tooth brush, toothpaste, comb, clean scrubs and advised to clean herself.  Patient wants to sleep.  Attempted to wake patient several times.  Patient not cooperative and wants to sleep.  Will continue to arouse patient a get her cleaned and dressed for discharge.

## 2018-04-01 NOTE — ED Notes (Signed)
Pt sleeping in hallway- had to be awakened for VS- pt agreeable to d/c and being assisted to bus stop.

## 2018-04-01 NOTE — ED Notes (Signed)
Pt moved to F2 to clean up after urinary incontinence prior to discharging pt.

## 2018-04-01 NOTE — ED Notes (Signed)
Pt tearful in hallway, given a blanket. Pt with strong smell of urine.

## 2018-04-01 NOTE — ED Notes (Signed)
Pt sleeping in hallway- states that she lives at a motel and is going to try to get money for a room tonight. Pt panhandles on [employer] for money--

## 2018-04-01 NOTE — ED Notes (Signed)
Pt discharged within the last hour. Waiting in waiting room. When asked if she was leaving for the bus stop patient began hysterically screaming and holding her right side. Prior to being asked when she was leaving, patient was sleeping in waiting room in NAD. Pt requested to check back in.

## 2018-04-01 NOTE — ED Notes (Signed)
Assisted patient with getting dressed and into wheelchair.  Soiled clothing placed in pt belonging bag (black shorts and green shirt)  Patient given sandwich, sprite and a bus pass and wheeled to lobby.  Patient states she will take the bus to [facility] where she stays in a motel.

## 2018-04-01 NOTE — ED Provider Notes (Signed)
[facility] EMERGENCY DEPARTMENT Provider Note   CSN: [id number] Arrival date & time: 04/01/18  8592     History   Chief Complaint No chief complaint on file.   HPI [name] is a 57 y.o. female.  HPI   [name] is a 57 y.o. female, with a history of HTN, anemia, frequent cocaine abuse, and recent bilateral AKA, presenting to the ED with reported abdominal pain. Patient had just been seen by me earlier this morning for phantom lower extremity pain following her bilateral AKA.  She was discharged, brought to the lobby where she sat for less than an hour, and then checked herself back in.  She complains of right flank pain "for a while."  She is noted to be sleeping without any restlessness, but then if someone addresses her she grabs her side and rolls around on the bed.   Denies fever/chills, urinary complaints, N/V/C/D, abnormal vaginal discharge, vaginal bleeding, chest pain, shortness of breath, or any other complaints.  Past Medical History:  Diagnosis Date  . CAP (community acquired pneumonia) 03/2014 X 2  . Cocaine abuse ([place])    ongoing with resultant vaculitis.  . Depression   . Headache    "weekly" (07/29/2016)  . Hypertension   . Inflammatory arthritis   . Migraines    "probably 5-6/yr" (07/29/2016)  . Normocytic anemia    BL Hgb 9.8-12. Last anemia panel 04/2010 - showing Fe 19, ferritin 101.  Pt on monthly B12 injections  . Rheumatoid arthritis(714.0)    patient reported  . 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 Followed by Dr. Louanne Skye    Patient Active Problem List   Diagnosis Date Noted  . Suicidal ideation 02/02/2018  . HCAP (healthcare-associated pneumonia) 02/02/2018  . Acute on chronic respiratory failure with hypoxia ([place]) 02/02/2018  . Sepsis ([place]) 02/02/2018  . Ulcer of amputation stump of lower extremity ([place]) 10/23/2017  . Cocaine-induced mood disorder with depressive  symptoms ([place]) 08/10/2017  . Hypertensive crisis   . Phantom limb pain ([place])   . S/P bilateral below knee amputation ([place]) 04/13/2017  . Tobacco abuse   . Post-operative pain   . Acute blood loss anemia   . 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 Wound infection 03/30/2017  . AKI (acute kidney injury) ([place]) 02/07/2017  . Acute kidney injury ([place]) 02/06/2017  . Wound healing, delayed   . Chest pain 04/07/2015  . Protein-calorie malnutrition, severe ([place]) 01/15/2015  . MDD (major depressive disorder), recurrent episode, severe ([place]) 10/16/2014  . Cocaine use disorder, severe, dependence ([place]) 10/10/2014  . 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 Essential hypertension 02/26/2010    Past Surgical History:  Procedure Laterality Date  . AMPUTATION Left 05/22/2016   Procedure: AMPUTATION LEFT LONG FINGER;  Surgeon: Marybelle Killings, MD;  Location: [facility];  Service: Orthopedics;  Laterality: Left;  . AMPUTATION Bilateral 04/10/2017   Procedure: AMPUTATION BELOW KNEE;  Surgeon: Newt Minion, MD;  Location: [facility];  Service: Orthopedics;  Laterality: Bilateral;  . AMPUTATION Bilateral 02/06/2018   Procedure: AMPUTATION ABOVE KNEE;  Surgeon: Newt Minion, MD;  Location: [facility];  Service: Orthopedics;  Laterality: Bilateral;  . HERNIA REPAIR     "stomach"  . I&D EXTREMITY Right 09/26/2015   Procedure: IRRIGATION AND DEBRIDEMENT LEG WOUND  VAC PLACEMENT.;  Surgeon: Loel Lofty Dillingham, DO;  Location:  [facility];  Service: Plastics;  Laterality: Right;  . INCISION AND DRAINAGE OF WOUND Bilateral 10/20/2016   Procedure: IRRIGATION AND DEBRIDEMENT WOUND BILATERAL;  Surgeon: Edrick Kins, DPM;  Location: [facility];  Service: Podiatry;  Laterality: Bilateral;  . IRRIGATION AND DEBRIDEMENT ABSCESS Bilateral 09/26/2013   Procedure: DEBRIDEMENT ULCERS BILATERAL THIGHS;  Surgeon: Gwenyth Ober, MD;  Location: [facility];  Service: General;  Laterality:  Bilateral;  . SKIN BIOPSY Bilateral    shin nodules     OB History   None      Home Medications    Prior to Admission medications   Medication Sig Start Date End Date Taking? Authorizing Provider  busPIRone (BUSPAR) 15 MG tablet Take 1 tablet (15 mg total) by mouth 3 (three) times daily. 03/02/18   Hongalgi, Lenis Dickinson, MD  escitalopram (LEXAPRO) 10 MG tablet Take 2 tablets (20 mg total) by mouth at bedtime. 03/02/18   Hongalgi, Lenis Dickinson, MD  gabapentin (NEURONTIN) 100 MG capsule Take 2 capsules (200 mg total) by mouth 2 (two) times daily. 03/02/18   Hongalgi, Lenis Dickinson, MD  gabapentin (NEURONTIN) 100 MG capsule Take 2 capsules (200 mg total) by mouth 2 (two) times daily for 15 days. 04/01/18 04/16/18  Prabhleen Montemayor C, PA-C  ibuprofen (ADVIL,MOTRIN) 200 MG tablet Take 2 tablets (400 mg total) by mouth every 6 (six) hours as needed for fever, mild pain or moderate pain. 03/02/18   Hongalgi, Lenis Dickinson, MD    Family History Family History  Problem Relation Age of Onset  . Breast cancer Mother        Breast cancer  . Alcohol abuse Mother   . Colon cancer Maternal Aunt 15  . Alcohol abuse Father     Social History Social History   Tobacco Use  . Smoking status: Current Every Day Smoker    Packs/day: 0.12    Years: 38.00    Pack years: 4.56    Types: Cigarettes  . Smokeless tobacco: Never Used  . Tobacco comment: 2 A DAY  Substance Use Topics  . Alcohol use: No    Alcohol/week: 0.0 oz  . Drug use: Yes    Types: "Crack" cocaine, Cocaine    Comment: Smoked crack 06/02/2017     Allergies   Acetaminophen and Lisinopril   Review of Systems Review of Systems  Constitutional: Negative for chills and fever.  Respiratory: Negative for shortness of breath.   Cardiovascular: Negative for chest pain.  Gastrointestinal: Negative for diarrhea, nausea and vomiting.  Genitourinary: Positive for flank pain. Negative for dysuria, frequency and hematuria.  Musculoskeletal: Negative for back  pain.  All other systems reviewed and are negative.    Physical Exam Updated Vital Signs BP (!) 138/91 (BP Location: Right Arm)   Pulse 97   Resp 18   SpO2 97%   Physical Exam  Constitutional: She appears well-developed and well-nourished. No distress.  HENT:  Head: Normocephalic and atraumatic.  Eyes: Conjunctivae are normal.  Neck: Neck supple.  Cardiovascular: Normal rate, regular rhythm, normal heart sounds and intact distal pulses.  Pulmonary/Chest: Effort normal and breath sounds normal. No respiratory distress.  Abdominal: Soft. There is no tenderness. There is no guarding and no CVA tenderness.  She has no tenderness whatsoever throughout the abdomen, flank, or back.  Musculoskeletal: She exhibits no edema.  Lymphadenopathy:    She has no cervical adenopathy.  Neurological: She is alert.  Skin: Skin is warm and dry. She is not diaphoretic.  Psychiatric:  She has a normal mood and affect. Her behavior is normal.  Nursing note and vitals reviewed.    ED Treatments / Results  Labs (all labs ordered are listed, but only abnormal results are displayed) Labs Reviewed - No data to display  EKG None  Radiology No results found.  Procedures Procedures (including critical care time)  Medications Ordered in ED Medications - No data to display   Initial Impression / Assessment and Plan / ED Course  I have reviewed the triage vital signs and the nursing notes.  Pertinent labs & imaging results that were available during my care of the patient were reviewed by me and considered in my medical decision making (see chart for details).     Patient presents shortly after being discharged for a complaint of right flank pain.  She was noted to be resting comfortably and sleeping until someone would address her, and then she would start complaining of flank pain. Patient eventually confided that she is homeless and that she needed to be here because she had nowhere else to  go. Patient rested in the hallway and had no further complaints.  Final Clinical Impressions(s) / ED Diagnoses   Final diagnoses:  Homelessness    ED Discharge Orders    None       Layla Maw 04/01/18 1606    Fredia Sorrow, MD 04/04/18 2211

## 2018-04-01 NOTE — Discharge Instructions (Addendum)
Follow-up with the orthopedic surgeon, as scheduled.  Please also follow-up with a primary care provider.

## 2018-04-01 NOTE — ED Notes (Signed)
Patient awake and bathing herself.

## 2018-04-11 IMAGING — DX DG HAND COMPLETE 3+V*L*
3 series · 3 of 3 positions shown · non-contrast
Comparison: None.

CLINICAL DATA: 55-year-old female with swelling of the left middle
finger.

EXAM:
LEFT HAND - COMPLETE 3+ VIEW

[hand pa]
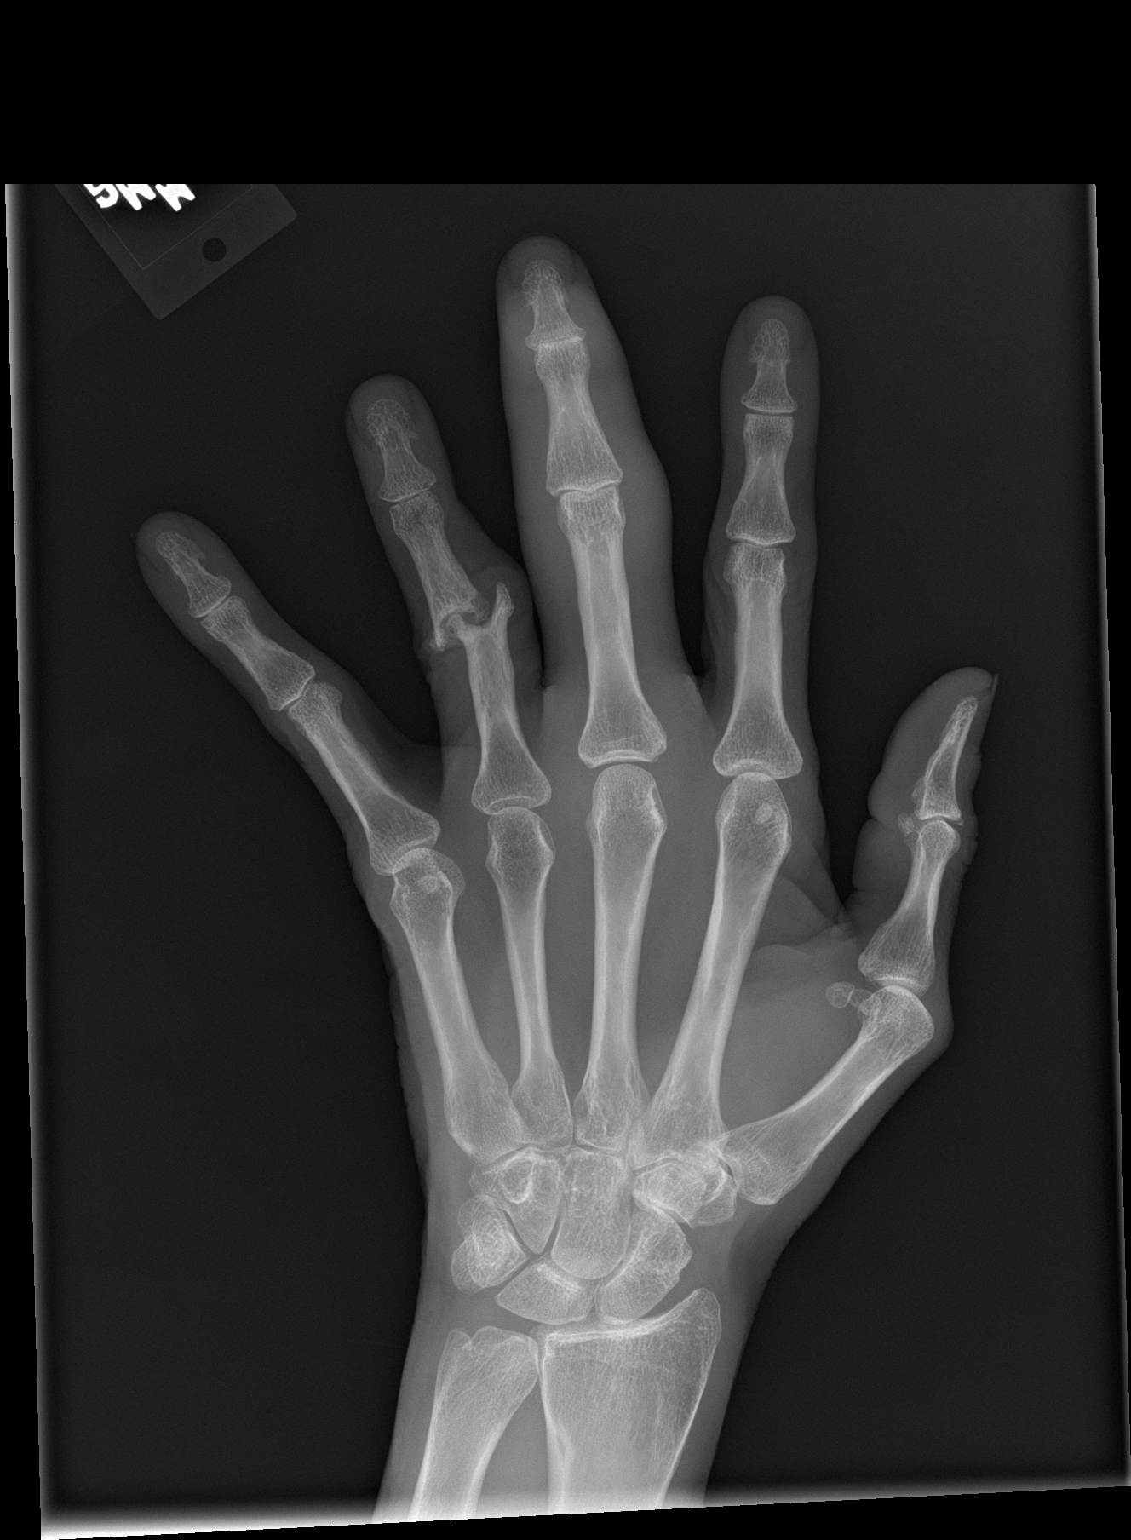

[hand obl]
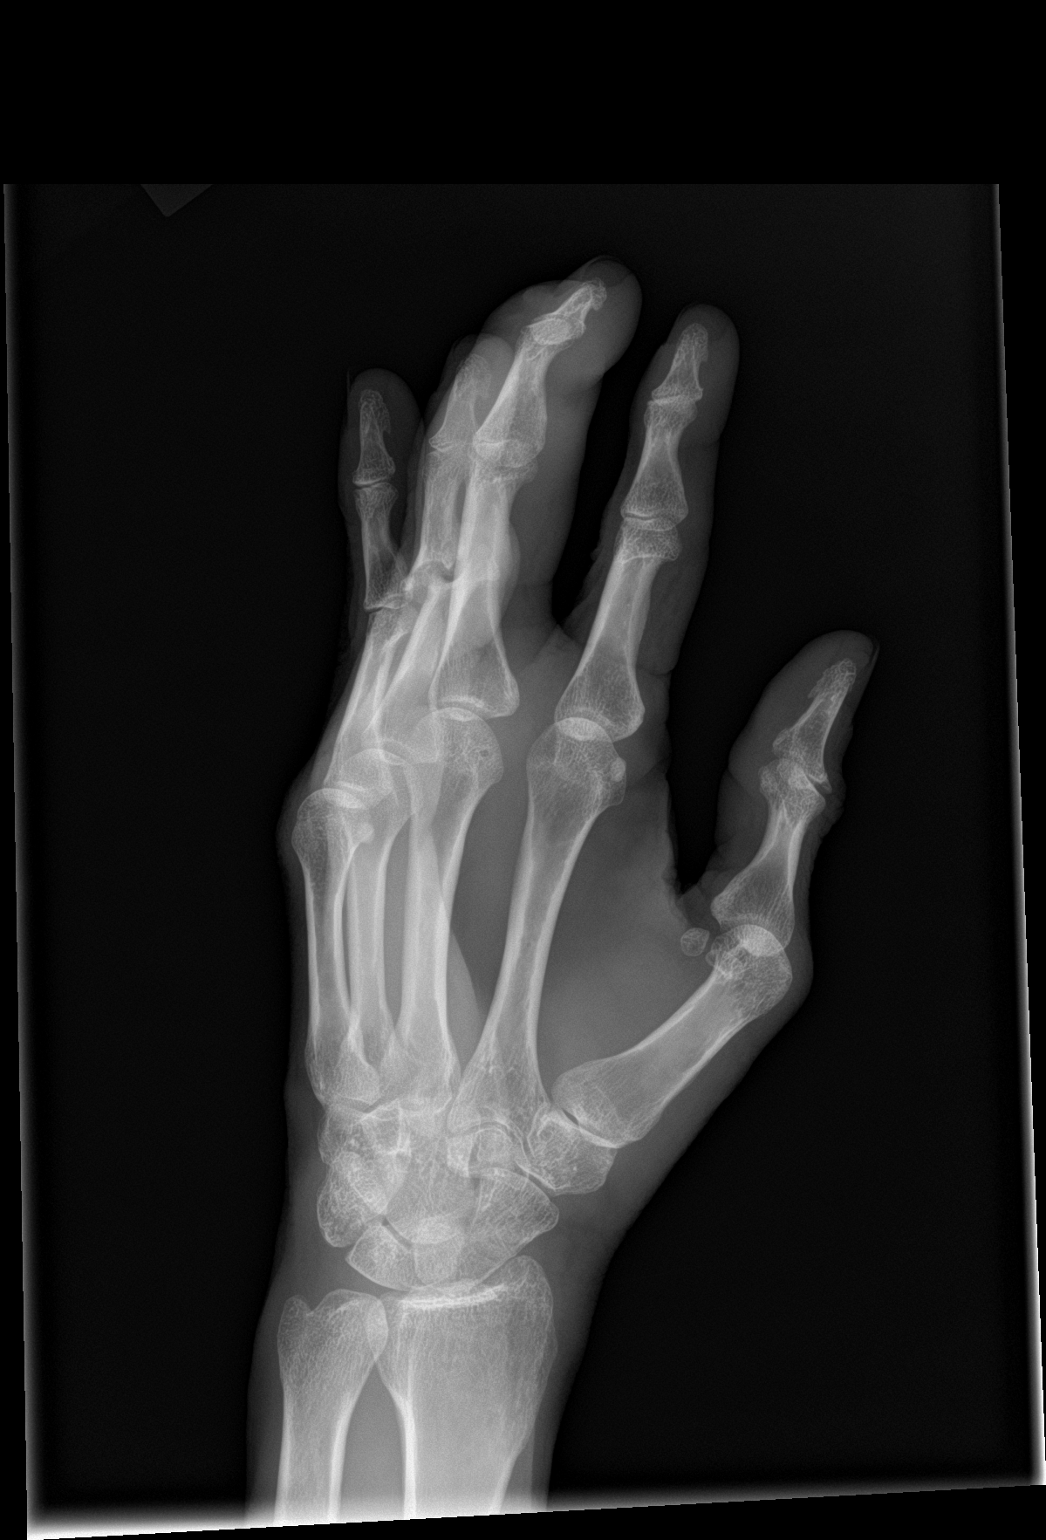

[hand lat]
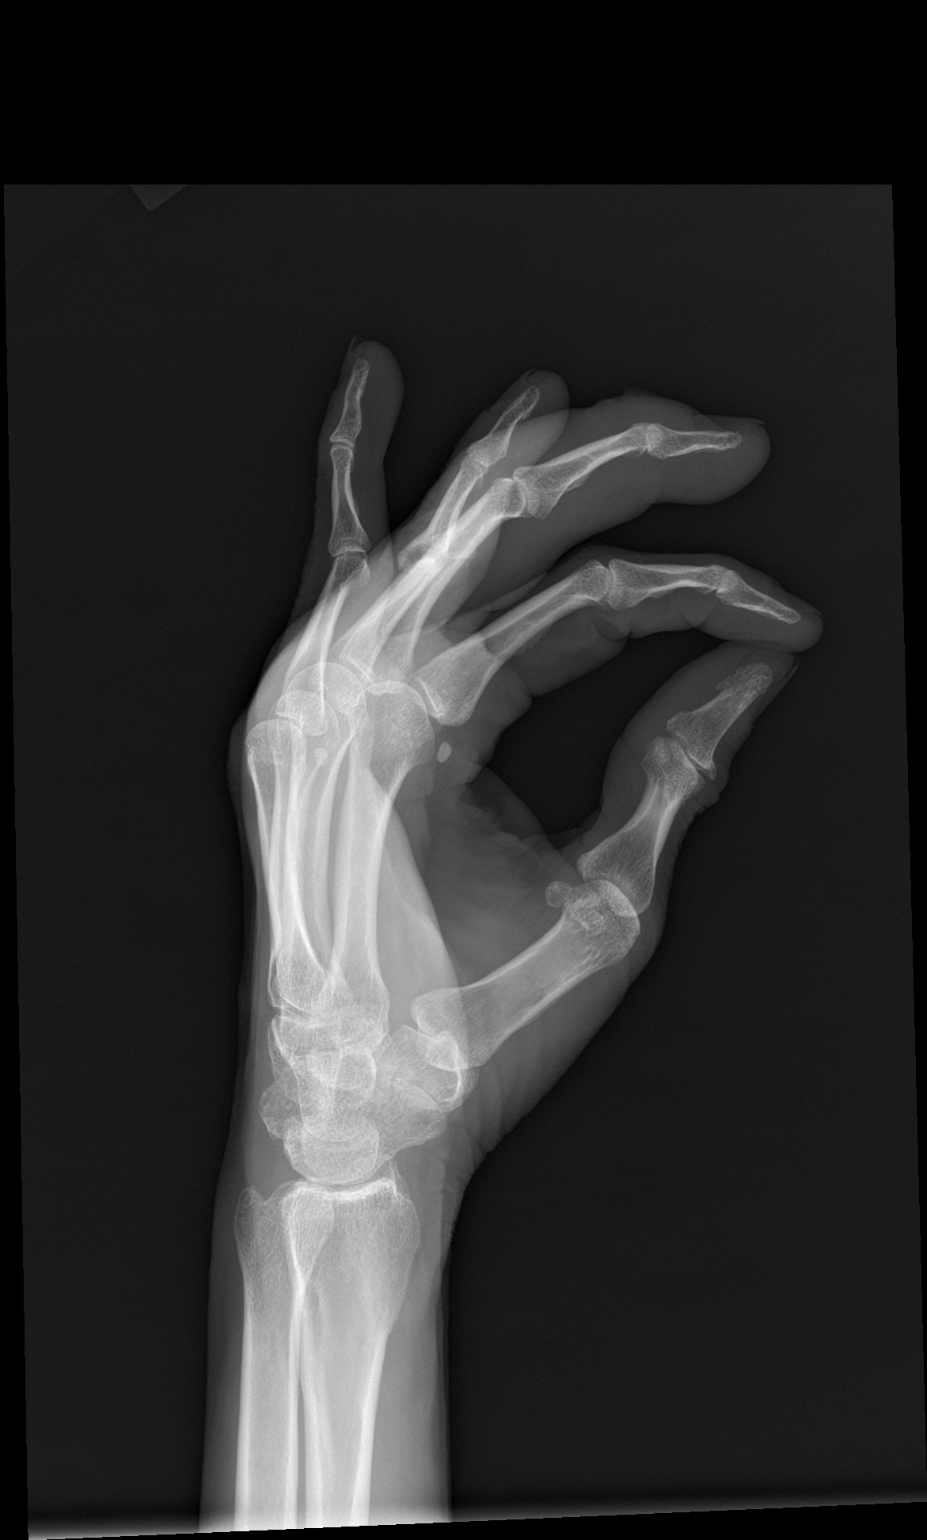

[3 of 3 positions shown; findings below may reference images not displayed]

FINDINGS: There is no acute fracture or dislocation. There is chronic erosive
changes of the PIP joint of the fifth digit with ulnar subluxation.
There is diffuse soft tissue swelling of the middle finger which may
be infectious in etiology or related to underlying inflammatory
process such as psoriatic arthritis. Clinical correlation is
recommended. No radiopaque foreign object or soft tissue gas.
IMPRESSION: Diffuse soft tissue swelling of the third digit may be infectious or
inflammatory in etiology. Clinical correlation is recommended.

No acute fracture or dislocation. Chronic erosive changes of the PIP
joint of the fourth digit.

## 2018-04-11 IMAGING — DX DG HAND COMPLETE 3+V*R*
3 series · 3 of 3 positions shown · non-contrast
Comparison: Radiograph dated 06/14/2015

CLINICAL DATA: 55-year-old female with right index finger swelling.

EXAM:
RIGHT HAND - COMPLETE 3+ VIEW

[hand pa]
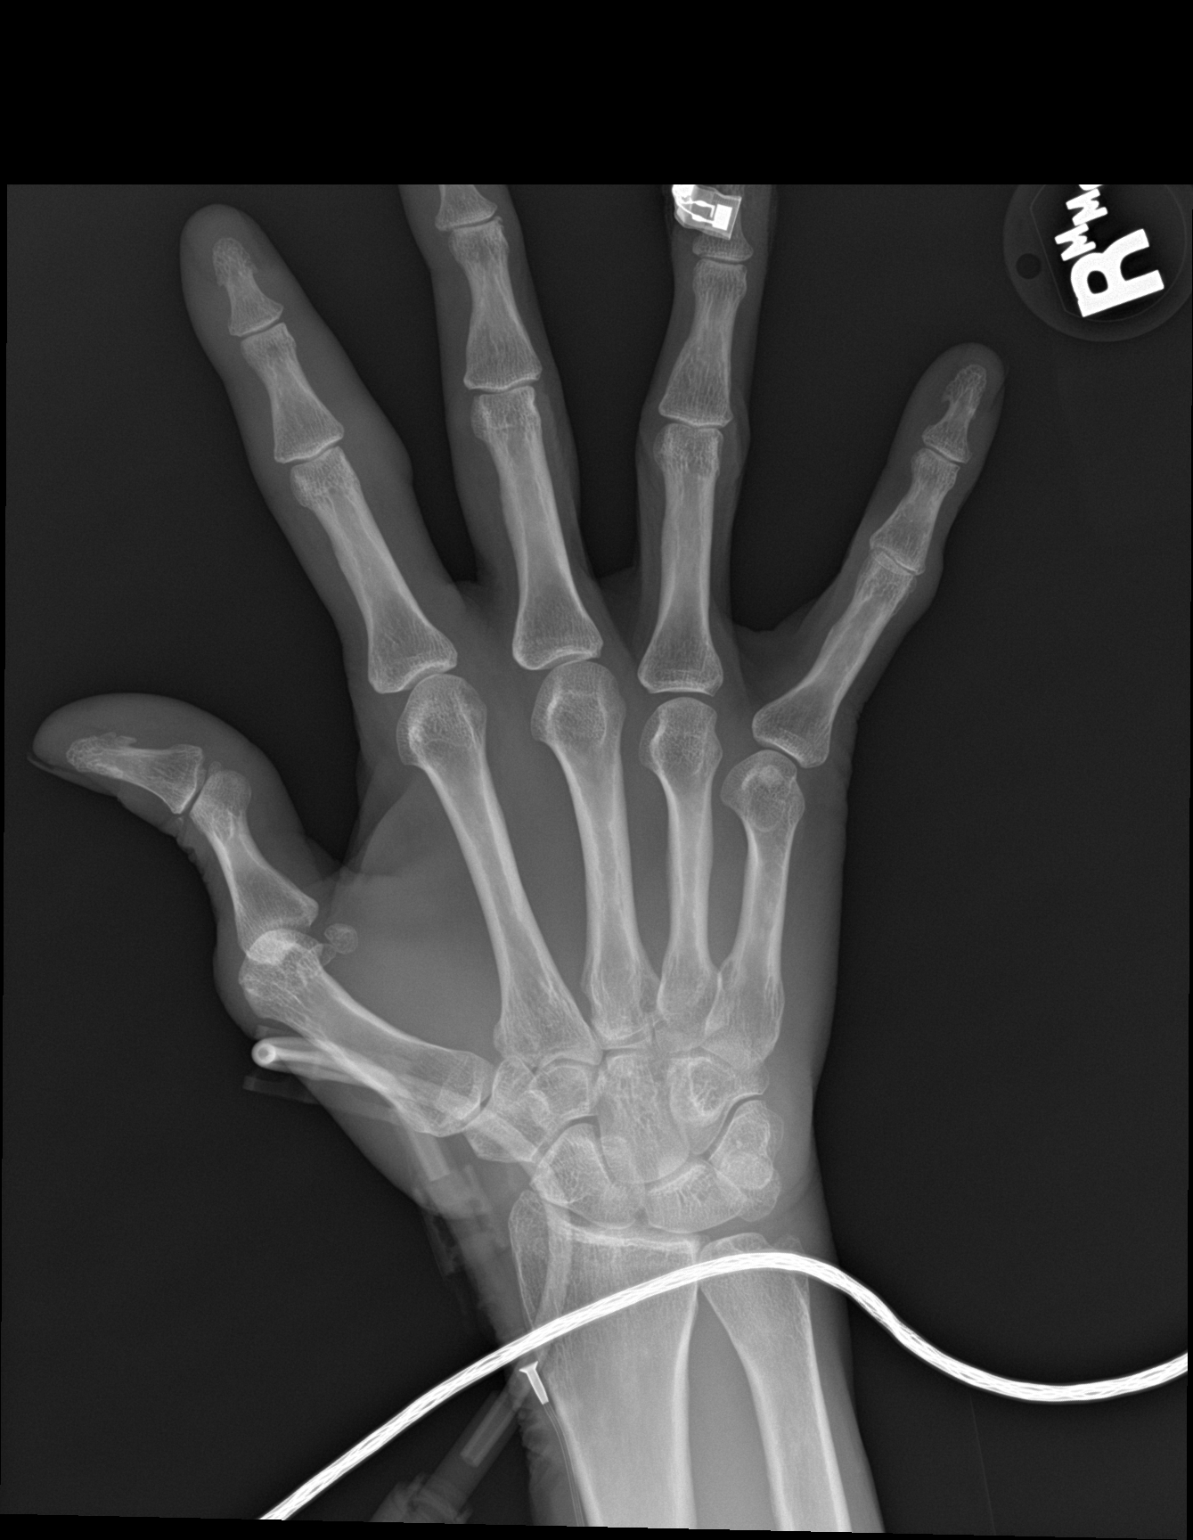

[hand obl]
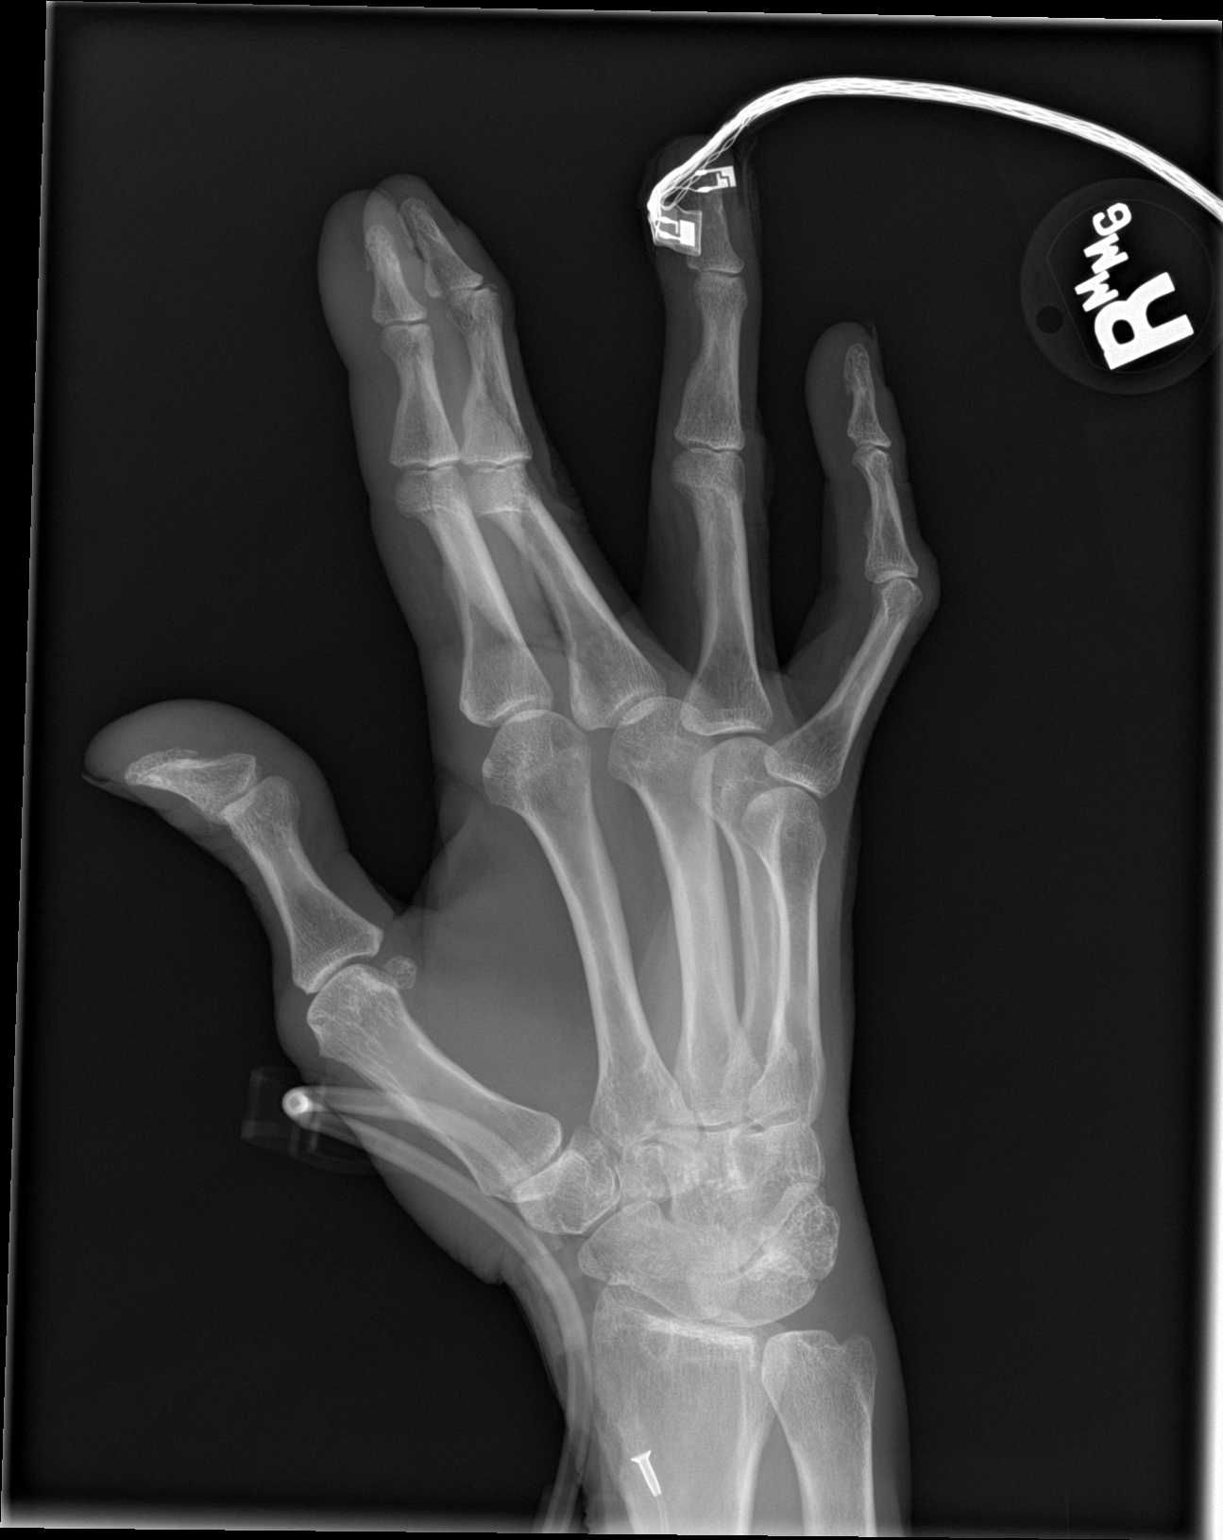

[hand lat]
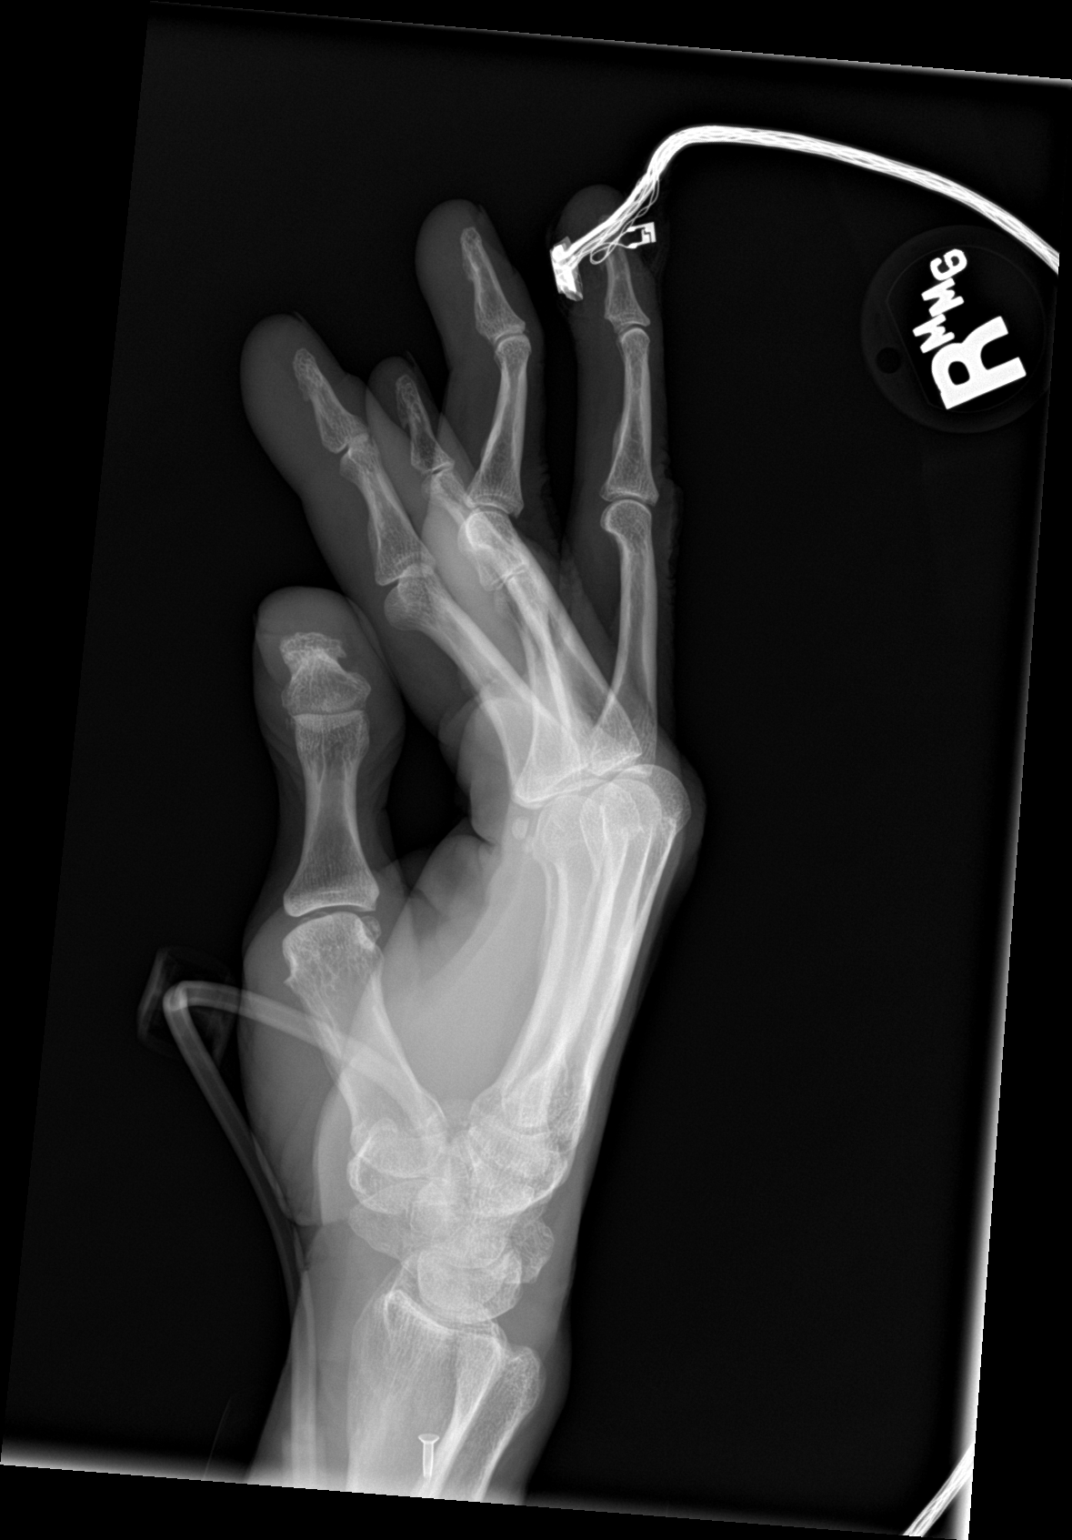

[3 of 3 positions shown; findings below may reference images not displayed]

FINDINGS: There is diffuse soft tissue swelling of the index finger,
progressed compared to the prior study. This may be infectious in
etiology or related to underlying inflammatory process such as
psoriatic arthritis. Clinical correlation is recommended. There is
no acute fracture for dislocation. The bones are mildly osteopenic.
No significant arthritic changes. There is extension of the PIP with
flexion of the DIP of the third digit.
IMPRESSION: Diffuse soft tissue swelling of the index finger likely related to
underlying inflammatory arthritis. Underlying infection is not
excluded. Clinical correlation is recommended.

## 2018-04-26 ENCOUNTER — Encounter (HOSPITAL_COMMUNITY): Payer: Self-pay | Admitting: Emergency Medicine

## 2018-04-26 ENCOUNTER — Other Ambulatory Visit: Payer: Self-pay

## 2018-04-26 ENCOUNTER — Emergency Department (HOSPITAL_COMMUNITY)
Admission: EM | Admit: 2018-04-26 | Discharge: 2018-04-26 | Disposition: A | Discharge status: Home / Self Care | Payer: Medicaid Other | Attending: Emergency Medicine | Admitting: Emergency Medicine

## 2018-04-26 ENCOUNTER — Emergency Department (HOSPITAL_COMMUNITY): Payer: Medicaid Other

## 2018-04-26 DIAGNOSIS — R0789 Other chest pain: Secondary | ICD-10-CM | POA: Diagnosis present

## 2018-04-26 DIAGNOSIS — R05 Cough: Secondary | ICD-10-CM | POA: Diagnosis not present

## 2018-04-26 DIAGNOSIS — R079 Chest pain, unspecified: Secondary | ICD-10-CM

## 2018-04-26 DIAGNOSIS — I10 Essential (primary) hypertension: Secondary | ICD-10-CM | POA: Insufficient documentation

## 2018-04-26 DIAGNOSIS — F17210 Nicotine dependence, cigarettes, uncomplicated: Secondary | ICD-10-CM | POA: Insufficient documentation

## 2018-04-26 DIAGNOSIS — Z79899 Other long term (current) drug therapy: Secondary | ICD-10-CM | POA: Insufficient documentation

## 2018-04-26 DIAGNOSIS — R059 Cough, unspecified: Secondary | ICD-10-CM

## 2018-04-26 DIAGNOSIS — I2510 Atherosclerotic heart disease of native coronary artery without angina pectoris: Secondary | ICD-10-CM | POA: Insufficient documentation

## 2018-04-26 LAB — BASIC METABOLIC PANEL
ANION GAP: 12 (ref 5–15)
BUN: 15 mg/dL (ref 6–20)
CALCIUM: 8.8 mg/dL — AB (ref 8.9–10.3)
CHLORIDE: 108 mmol/L (ref 98–111)
CO2: 18 mmol/L — AB (ref 22–32)
Creatinine, Ser: 0.71 mg/dL (ref 0.44–1.00)
GFR calc Af Amer: >60 mL/min (ref >60)
GFR calc non Af Amer: >60 mL/min (ref >60)
Glucose, Bld: 110 mg/dL — ABNORMAL HIGH (ref 70–99)
POTASSIUM: 3.5 mmol/L (ref 3.5–5.1)
Sodium: 138 mmol/L (ref 135–145)

## 2018-04-26 LAB — CBC
HEMATOCRIT: 34.8 % — AB (ref 36.0–46.0)
HEMOGLOBIN: 10.4 g/dL — AB (ref 12.0–15.0)
MCH: 29.1 pg (ref 26.0–34.0)
MCHC: 29.9 g/dL — ABNORMAL LOW (ref 30.0–36.0)
MCV: 97.5 fL (ref 78.0–100.0)
Platelets: 277 10*3/uL (ref 150–400)
RBC: 3.57 MIL/uL — AB (ref 3.87–5.11)
RDW: 16 % — ABNORMAL HIGH (ref 11.5–15.5)
WBC: 7 10*3/uL (ref 4.0–10.5)

## 2018-04-26 LAB — I-STAT TROPONIN, ED: TROPONIN I, POC: 0.06 ng/mL (ref 0.00–0.08)

## 2018-04-26 NOTE — ED Notes (Signed)
Pt stated that she wanted to rest in the waiting room for the night so she can catch the bus in the morning.

## 2018-04-26 NOTE — Discharge Instructions (Signed)
Your cardiac work-up today looked great. Please follow-up with your primary care doctor. Return here for any new/acute changes.

## 2018-04-26 NOTE — ED Triage Notes (Signed)
Per [facility], pt coming [place] gas station where she had sudden onset of chest pain. Pt did admit to smoking crack today.

## 2018-04-26 NOTE — ED Notes (Signed)
Patient verbalizes understanding of discharge instructions. Opportunity for questioning and answers were provided. Armband removed by staff, pt discharged from ED.  

## 2018-04-26 NOTE — ED Provider Notes (Signed)
[facility] EMERGENCY DEPARTMENT Provider Note   CSN: [id number] Arrival date & time: 04/26/18  0040     History   Chief Complaint Chief Complaint  Patient presents with  . Chest Pain    HPI [name] is a 57 y.o. female.  The history is provided by the patient and medical records.    57 y.o. F with history of cocaine abuse with resultant vasculitis, depression, hypertension, inflammatory arthritis, migraines, rheumatoid arthritis, anemia, presenting to the ED with chest pain.  Patient reports this began about an hour and a half ago while she was at a gas station.  States she has been having a "nasty cough" for about a week now.  This is productive with thick mucus but denies any hemoptysis.  States she intermittently feels short of breath, mostly whenever she gets into a coughing fit.  States her chest feels "sore" from coughing.  She denies any known cardiac history.  She is a daily smoker.  Triage note reports she smoked crack today, however patient states that was yesterday.  Past Medical History:  Diagnosis Date  . CAP (community acquired pneumonia) 03/2014 X 2  . Cocaine abuse ([place])    ongoing with resultant vaculitis.  . Depression   . Headache    "weekly" (07/29/2016)  . Hypertension   . Inflammatory arthritis   . Migraines    "probably 5-6/yr" (07/29/2016)  . Normocytic anemia    BL Hgb 9.8-12. Last anemia panel 04/2010 - showing Fe 19, ferritin 101.  Pt on monthly B12 injections  . Rheumatoid arthritis(714.0)    patient reported  . 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 Followed by Dr. Louanne Skye    Patient Active Problem List   Diagnosis Date Noted  . Suicidal ideation 02/02/2018  . HCAP (healthcare-associated pneumonia) 02/02/2018  . Acute on chronic respiratory failure with hypoxia ([place]) 02/02/2018  . Sepsis ([place]) 02/02/2018  . Ulcer of amputation stump of lower extremity ([place])  10/23/2017  . 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 Hypertensive crisis   . Phantom limb pain ([place])   . S/P bilateral below knee amputation ([place]) 04/13/2017  . Tobacco abuse   . Post-operative pain   . Acute blood loss anemia   . Atherosclerosis of native arteries of extremities with gangrene, bilateral legs ([place])   . Wound infection 03/30/2017  . AKI (acute kidney injury) ([place]) 02/07/2017  . Acute kidney injury ([place]) 02/06/2017  . Wound healing, delayed   . Chest pain 04/07/2015  . Protein-calorie malnutrition, severe ([place]) 01/15/2015  . MDD (major depressive disorder), recurrent episode, severe ([place]) 10/16/2014  . Cocaine use disorder, severe, dependence ([place]) 10/10/2014  . 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 Essential hypertension 02/26/2010    Past Surgical History:  Procedure Laterality Date  . AMPUTATION Left 05/22/2016   Procedure: AMPUTATION LEFT LONG FINGER;  Surgeon: Marybelle Killings, MD;  Location: [facility];  Service: Orthopedics;  Laterality: Left;  . AMPUTATION Bilateral 04/10/2017   Procedure: AMPUTATION BELOW KNEE;  Surgeon: Newt Minion, MD;  Location: [facility];  Service: Orthopedics;  Laterality: Bilateral;  . AMPUTATION Bilateral 02/06/2018   Procedure: AMPUTATION ABOVE KNEE;  Surgeon: Newt Minion, MD;  Location: [facility];  Service: Orthopedics;  Laterality: Bilateral;  . HERNIA REPAIR     "stomach"  . I&D EXTREMITY Right 09/26/2015   Procedure: IRRIGATION AND DEBRIDEMENT LEG WOUND  [place].;  Surgeon: Loel Lofty Dillingham, DO;  Location: [facility];  Service: Plastics;  Laterality: Right;  . INCISION AND DRAINAGE OF WOUND Bilateral 10/20/2016   Procedure: IRRIGATION AND DEBRIDEMENT WOUND BILATERAL;  Surgeon: Edrick Kins, DPM;  Location: [facility];  Service: Podiatry;  Laterality: Bilateral;  . IRRIGATION AND DEBRIDEMENT ABSCESS Bilateral 09/26/2013   Procedure: DEBRIDEMENT ULCERS BILATERAL THIGHS;  Surgeon: Gwenyth Ober,  MD;  Location: [facility];  Service: General;  Laterality: Bilateral;  . SKIN BIOPSY Bilateral    shin nodules     OB History   None      Home Medications    Prior to Admission medications   Medication Sig Start Date End Date Taking? Authorizing Provider  busPIRone (BUSPAR) 15 MG tablet Take 1 tablet (15 mg total) by mouth 3 (three) times daily. 03/02/18   Hongalgi, Lenis Dickinson, MD  escitalopram (LEXAPRO) 10 MG tablet Take 2 tablets (20 mg total) by mouth at bedtime. 03/02/18   Hongalgi, Lenis Dickinson, MD  gabapentin (NEURONTIN) 100 MG capsule Take 2 capsules (200 mg total) by mouth 2 (two) times daily. 03/02/18   Hongalgi, Lenis Dickinson, MD  gabapentin (NEURONTIN) 100 MG capsule Take 2 capsules (200 mg total) by mouth 2 (two) times daily for 15 days. 04/01/18 04/16/18  Joy, Shawn C, PA-C  ibuprofen (ADVIL,MOTRIN) 200 MG tablet Take 2 tablets (400 mg total) by mouth every 6 (six) hours as needed for fever, mild pain or moderate pain. 03/02/18   Hongalgi, Lenis Dickinson, MD    Family History Family History  Problem Relation Age of Onset  . Breast cancer Mother        Breast cancer  . Alcohol abuse Mother   . Colon cancer Maternal Aunt 59  . Alcohol abuse Father     Social History Social History   Tobacco Use  . Smoking status: Current Every Day Smoker    Packs/day: 0.12    Years: 38.00    Pack years: 4.56    Types: Cigarettes  . Smokeless tobacco: Never Used  . Tobacco comment: 2 A DAY  Substance Use Topics  . Alcohol use: No    Alcohol/week: 0.0 oz  . Drug use: Yes    Types: "Crack" cocaine, Cocaine    Comment: Smoked crack 06/02/2017     Allergies   Acetaminophen and Lisinopril   Review of Systems Review of Systems  Cardiovascular: Positive for chest pain.  All other systems reviewed and are negative.    Physical Exam Updated Vital Signs BP (!) 169/105 (BP Location: Right Arm)   Pulse 91   Temp 98.1 F (36.7 C) (Oral)   Resp 18   Ht 5' 1"  (1.549 m)   Wt 40.8 kg (90 lb)   SpO2  100%   BMI 17.01 kg/m   Physical Exam  Constitutional: She is oriented to person, place, and time. She appears well-developed and well-nourished.  Thin, somewhat disheveled appearing  HENT:  Head: Normocephalic and atraumatic.  Mouth/Throat: Oropharynx is clear and moist.  Eyes: Pupils are equal, round, and reactive to light. Conjunctivae and EOM are normal.  Neck: Normal range of motion.  Cardiovascular: Normal rate, regular rhythm and normal heart sounds.  Pulmonary/Chest: Effort normal and breath sounds normal. She has no decreased breath sounds. She has no wheezes.  Chest wall somewhat tender to palpation diffusely, no deformities or significant signs of trauma  Abdominal: Soft. Bowel sounds are normal.  Musculoskeletal: Normal range of motion.  Bilateral BKA  Neurological: She is alert and oriented to person, place, and time.  Skin: Skin is warm and dry.  Psychiatric: She has a normal mood and affect.  Nursing note and vitals reviewed.    ED Treatments / Results  Labs (all labs ordered are listed, but only abnormal results are displayed) Labs Reviewed  BASIC METABOLIC PANEL - Abnormal; Notable for the following components:      Result Value   CO2 18 (*)    Glucose, Bld 110 (*)    Calcium 8.8 (*)    All other components within normal limits  CBC - Abnormal; Notable for the following components:   RBC 3.57 (*)    Hemoglobin 10.4 (*)    HCT 34.8 (*)    MCHC 29.9 (*)    RDW 16.0 (*)    All other components within normal limits  I-STAT TROPONIN, ED    EKG None  Radiology Dg Chest 2 View  Result Date: 04/26/2018 CLINICAL DATA:  Chest pain and cough for 2 weeks. EXAM: CHEST - 2 VIEW COMPARISON:  02/02/2018 FINDINGS: Mild cardiac enlargement. Shallow inspiration. Previous right lung infiltrates have resolved. There is a small right pleural effusion with mild basilar atelectasis. No pneumothorax. Calcification of the aorta. Mediastinal contours appear intact.  IMPRESSION: Small right pleural effusion with basilar atelectasis. Resolution of previous right lung infiltrates. Electronically Signed   By: Lucienne Capers M.D.   On: 04/26/2018 01:52    Procedures Procedures (including critical care time)  Medications Ordered in ED Medications - No data to display   Initial Impression / Assessment and Plan / ED Course  I have reviewed the triage vital signs and the nursing notes.  Pertinent labs & imaging results that were available during my care of the patient were reviewed by me and considered in my medical decision making (see chart for details).  57 y.o. F here with chest pain.  History of heavy smoking as well as crack cocaine abused with resultant vasculitis.  Last used crack yesterday. EKG with prolonged QT, has been seen before on prior EKG's as well.  She is on anti-psychotics but neither of these with prolonging potential.  Labs overall reassuring.  Chest x-ray is clear.  Patient's pain is reproducible on exam with palpation of the chest wall.  Reports just feels "sore" from coughing.  Feel this is likely musculoskeletal.  Lower suspicion for ACS, PE, dissection, acute cardiac event.  Feel she is stable for discharge home.  Will have her follow-up with PCP.  Discussed plan with patient, she acknowledged understanding and agreed with plan of care.  Return precautions given for new or worsening symptoms.  Final Clinical Impressions(s) / ED Diagnoses   Final diagnoses:  Chest pain in adult  Cough    ED Discharge Orders    None       Larene Pickett, PA-C 04/26/18 [id number]    Jola Schmidt, MD 04/26/18 [phone]

## 2018-04-28 ENCOUNTER — Telehealth: Payer: Self-pay | Admitting: Family Medicine

## 2018-04-28 NOTE — Telephone Encounter (Signed)
Received call from friend of pt. Requesting continuation of in home care services; pt will fax requested information to this office for review

## 2018-05-02 NOTE — Telephone Encounter (Signed)
Form received and put in providers box to be filled out

## 2018-05-03 IMAGING — DX DG HAND COMPLETE 3+V*L*
3 series · 3 of 3 positions shown · non-contrast
Comparison: April 27, 2016

CLINICAL DATA: Abnormal third proximal interphalangeal joint.
Question osteomyelitis.

EXAM:
LEFT HAND - COMPLETE 3+ VIEW

[x hand pa left]
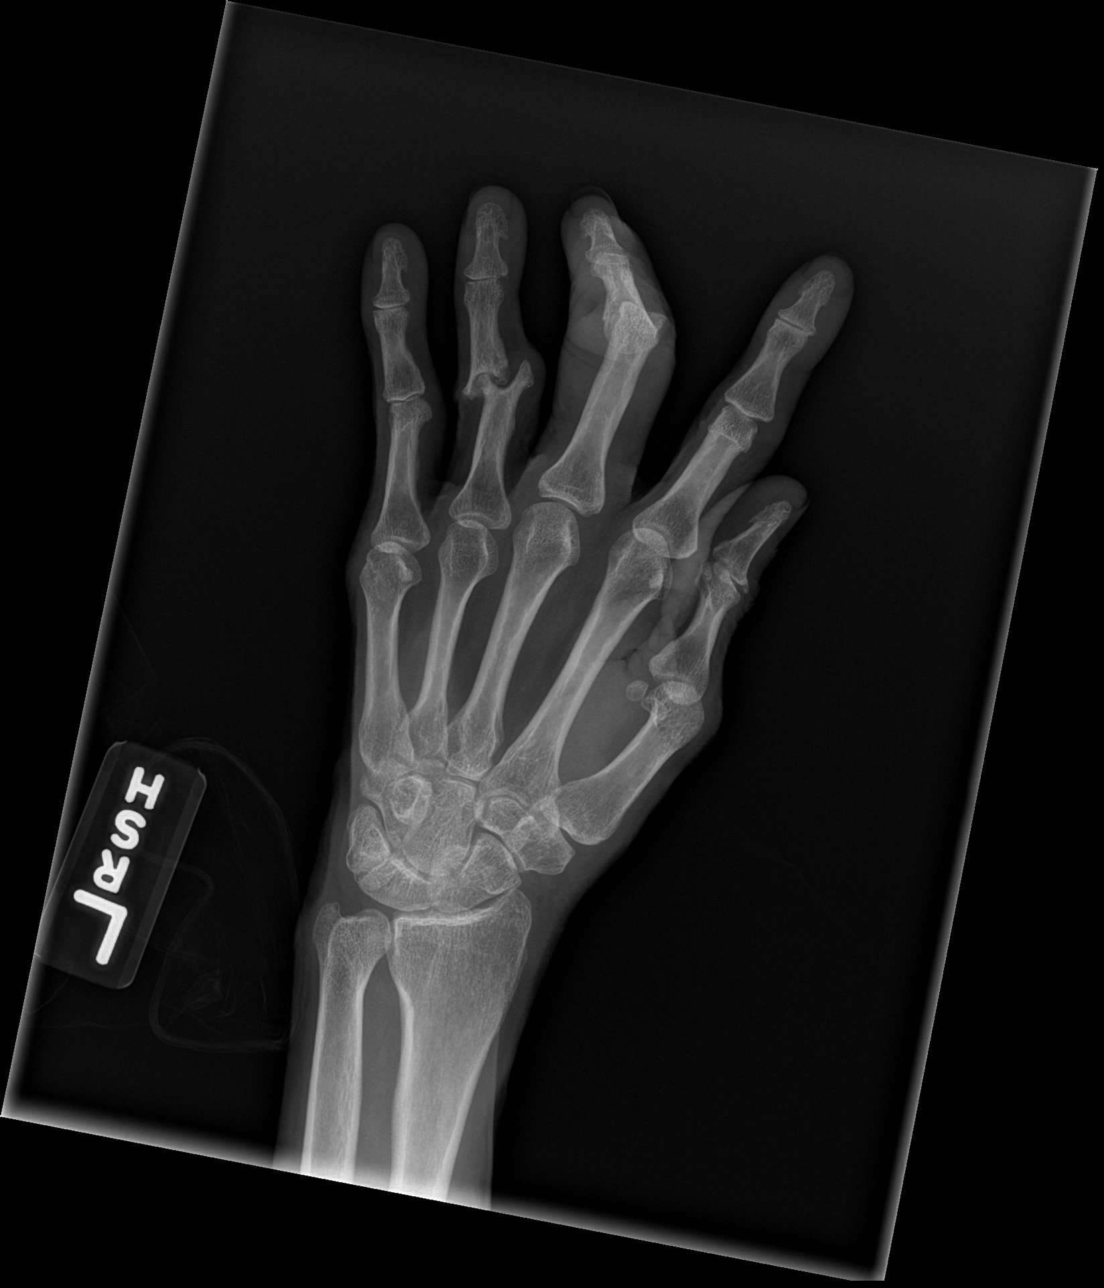

[x hand obl left]
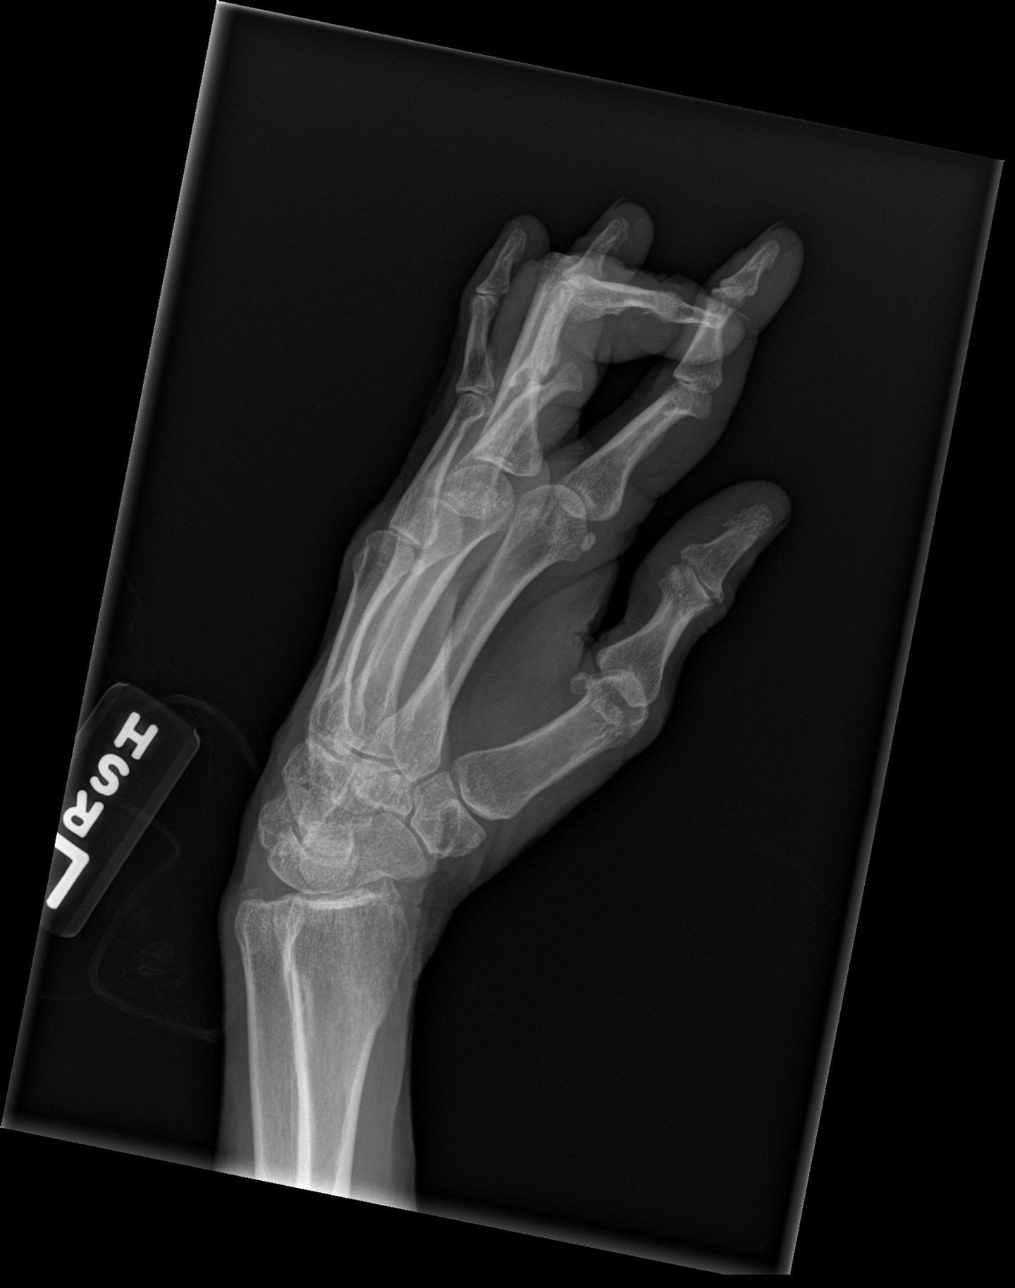

[x hand lat left]
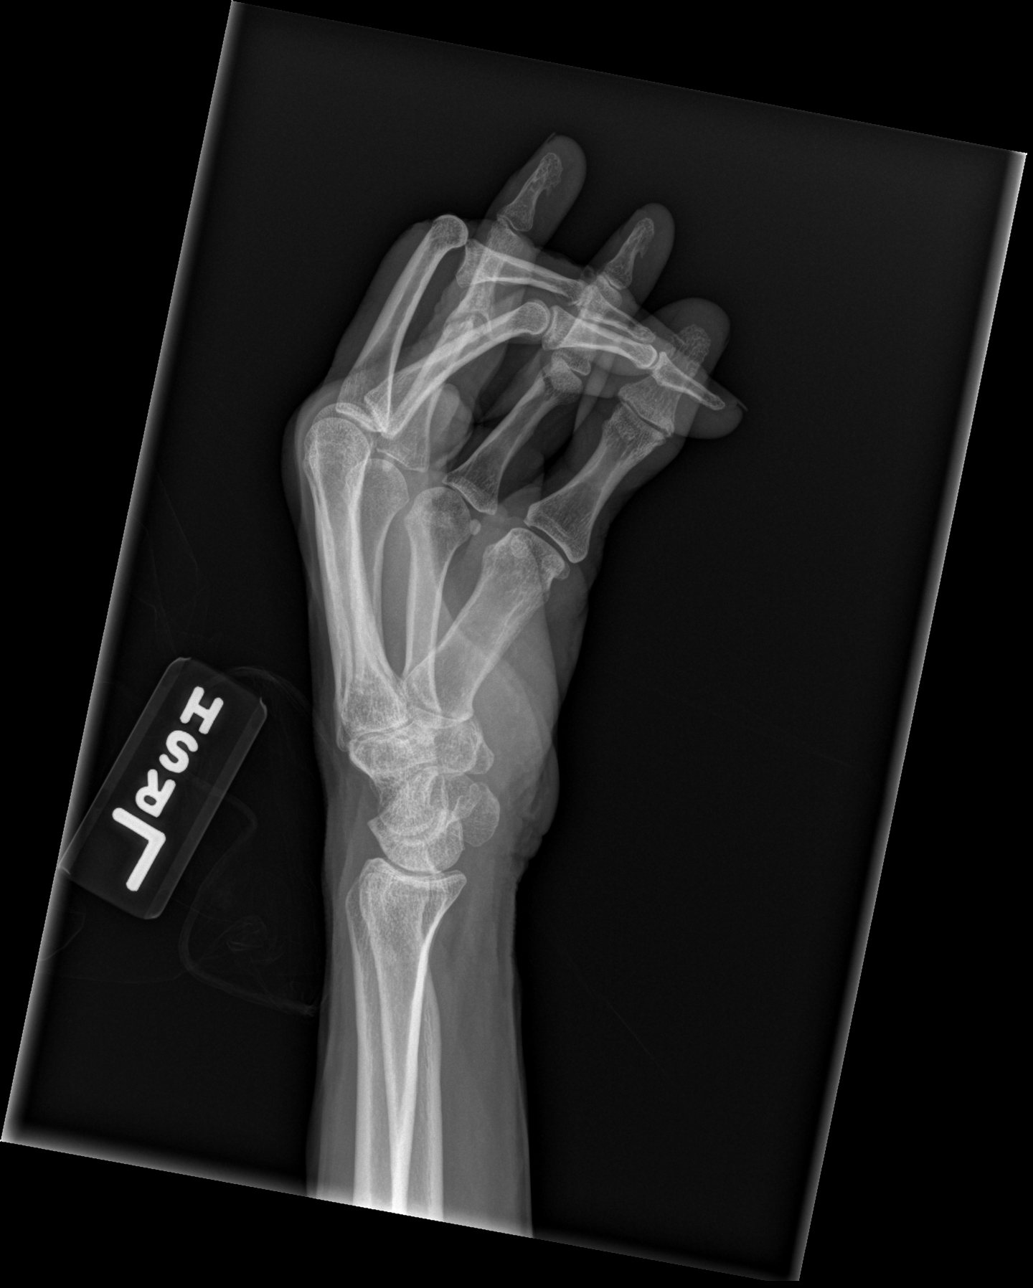

[3 of 3 positions shown; findings below may reference images not displayed]

FINDINGS: Chronic erosive changes of the fourth proximal interphalangeal joint
are stable. The third proximal interphalangeal joint is dislocated.
A well corticated lucency along the ulnar aspect of the middle
phalanx is favored to represent a prominent nutrient foramen. No
definitive fractures identified. However, a repeat film after
relocation is recommended as evaluation for fracture is limited due
to overlapping bones on this study. No other acute abnormalities are
noted. No bony erosion to suggest osteomyelitis.
IMPRESSION: 1. The third finger is dislocated at the proximal interphalangeal
joint. There is a prominent nutrient foramen but no definitive
fracture. However, evaluation for subtle fracture is limited due to
overlapping bones and a repeat film after relocation is recommended.
2. Chronic erosive changes at the fourth proximal interphalangeal
joint.
These results will be called to the ordering clinician or
representative by the Radiologist Assistant, and communication
documented in the PACS or zVision Dashboard.

## 2018-05-06 NOTE — Telephone Encounter (Signed)
Left a message with patient brother to have patient call and schedule appointment

## 2018-05-06 NOTE — Telephone Encounter (Signed)
-----   Message from Azzie Glatter, [place] sent at 05/05/2018 10:53 PM EDT ----- Regarding: "Follow Up Appointment" Andria Frames,  Please schedule patient for follow up appointment with me next week. [facility] needs paperwork completed.   Phone number to facility is: [phone].  Thank you.

## 2018-05-07 ENCOUNTER — Emergency Department (HOSPITAL_COMMUNITY)
Admission: EM | Admit: 2018-05-07 | Discharge: 2018-05-07 | Disposition: A | Discharge status: Home / Self Care | Payer: Medicaid Other | Attending: Emergency Medicine | Admitting: Emergency Medicine

## 2018-05-07 ENCOUNTER — Other Ambulatory Visit: Payer: Self-pay

## 2018-05-07 ENCOUNTER — Encounter (HOSPITAL_COMMUNITY): Payer: Self-pay

## 2018-05-07 DIAGNOSIS — T7840XA Allergy, unspecified, initial encounter: Secondary | ICD-10-CM | POA: Diagnosis not present

## 2018-05-07 DIAGNOSIS — R220 Localized swelling, mass and lump, head: Secondary | ICD-10-CM | POA: Diagnosis not present

## 2018-05-07 DIAGNOSIS — I10 Essential (primary) hypertension: Secondary | ICD-10-CM | POA: Insufficient documentation

## 2018-05-07 DIAGNOSIS — Z79899 Other long term (current) drug therapy: Secondary | ICD-10-CM | POA: Insufficient documentation

## 2018-05-07 DIAGNOSIS — F17210 Nicotine dependence, cigarettes, uncomplicated: Secondary | ICD-10-CM | POA: Insufficient documentation

## 2018-05-07 MED ORDER — DIPHENHYDRAMINE HCL 25 MG PO TABS: 25.0000 mg | ORAL_TABLET | Freq: Four times a day (QID) | ORAL | 0 refills | Status: DC | PRN

## 2018-05-07 MED ORDER — RANITIDINE HCL 150 MG PO TABS: 150.0000 mg | ORAL_TABLET | Freq: Two times a day (BID) | ORAL | 0 refills | Status: DC

## 2018-05-07 MED ORDER — PREDNISONE 20 MG PO TABS
60.0000 mg | ORAL_TABLET | Freq: Once | ORAL | Status: AC
  Administered 2018-05-07: 60 mg via ORAL
  Filled 2018-05-07: qty 3

## 2018-05-07 MED ORDER — HYDROCHLOROTHIAZIDE 25 MG PO TABS: 25.0000 mg | ORAL_TABLET | Freq: Every day | ORAL | 0 refills | Status: DC

## 2018-05-07 MED ORDER — DIPHENHYDRAMINE HCL 25 MG PO CAPS
25.0000 mg | ORAL_CAPSULE | Freq: Once | ORAL | Status: AC
  Administered 2018-05-07: 25 mg via ORAL
  Filled 2018-05-07: qty 1

## 2018-05-07 MED ORDER — HYDROCHLOROTHIAZIDE 12.5 MG PO CAPS
25.0000 mg | ORAL_CAPSULE | Freq: Once | ORAL | Status: AC
  Administered 2018-05-07: 25 mg via ORAL
  Filled 2018-05-07: qty 2

## 2018-05-07 MED ORDER — PREDNISONE 20 MG PO TABS: ORAL_TABLET | ORAL | 0 refills | Status: DC

## 2018-05-07 MED ORDER — FAMOTIDINE 20 MG PO TABS
20.0000 mg | ORAL_TABLET | Freq: Once | ORAL | Status: AC
  Administered 2018-05-07: 20 mg via ORAL
  Filled 2018-05-07: qty 1

## 2018-05-07 NOTE — ED Provider Notes (Signed)
[facility] DEPT Provider Note   CSN: [id number] Arrival date & time: 05/07/18  1346     History   Chief Complaint Chief Complaint  Patient presents with  . Facial Swelling    HPI [name] is a 57 y.o. female with a PMHx of homelessness, HTN, cocaine abuse, depression, chronic headaches, anemia, RA, and other conditions listed below, who presents to the ED with complaints of facial swelling for the last 5 days.  Patient states that she started noticing her face felt swollen 5 days ago, it seems worse on the right side.  She reports some mild itching but no pain.  She has not tried anything for it, no known aggravating factors.  She denies any recent animal or plant contact, changes in soaps or detergents, cosmetic use, changes in medications, or any other exposures.  She states that she is not on any medications right now because she ran out "a while ago".  She is not sure what she is supposed to be taking.  Chart review reveals that the last blood pressure medicine she was prescribed was HCTZ 25 mg, prescribed in April 2019.  She denies any rashes, warmth or erythema to the swollen areas on her face, eye pain or drainage, drooling, trismus, tongue swelling, difficulty swallowing or breathing, recent fevers, chills, CP, SOB, abd pain, N/V/D/C, hematuria, dysuria, myalgias, arthralgias, numbness, tingling, focal weakness, or any other complaints at this time.   The history is provided by the patient and medical records. No language interpreter was used.    Past Medical History:  Diagnosis Date  . CAP (community acquired pneumonia) 03/2014 X 2  . Cocaine abuse ([place])    ongoing with resultant vaculitis.  . Depression   . Headache    "weekly" (07/29/2016)  . Hypertension   . Inflammatory arthritis   . Migraines    "probably 5-6/yr" (07/29/2016)  . Normocytic anemia    BL Hgb 9.8-12. Last anemia panel 04/2010 - showing Fe 19, ferritin 101.  Pt on monthly  B12 injections  . Rheumatoid arthritis(714.0)    patient reported  . 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 Followed by Dr. Louanne Skye    Patient Active Problem List   Diagnosis Date Noted  . Suicidal ideation 02/02/2018  . HCAP (healthcare-associated pneumonia) 02/02/2018  . Acute on chronic respiratory failure with hypoxia ([employer]) 02/02/2018  . Sepsis ([place]) 02/02/2018  . Ulcer of amputation stump of lower extremity ([place]) 10/23/2017  . Cocaine-induced mood disorder with depressive symptoms ([place]) 08/10/2017  . Hypertensive crisis   . Phantom limb pain ([place])   . S/P bilateral below knee amputation ([place]) 04/13/2017  . Tobacco abuse   . Post-operative pain   . Acute blood loss anemia   . Atherosclerosis of native arteries of extremities with gangrene, bilateral legs ([place])   . Wound infection 03/30/2017  . AKI (acute kidney injury) ([place]) 02/07/2017  . Acute kidney injury ([place]) 02/06/2017  . Wound healing, delayed   . Chest pain 04/07/2015  . Protein-calorie malnutrition, severe ([place]) 01/15/2015  . MDD (major depressive disorder), recurrent episode, severe ([place]) 10/16/2014  . Cocaine use disorder, severe, dependence ([place]) 10/10/2014  . 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 Essential hypertension 02/26/2010    Past Surgical History:  Procedure Laterality Date  . AMPUTATION Left 05/22/2016   Procedure: AMPUTATION LEFT LONG FINGER;  Surgeon: Marybelle Killings, MD;  Location: [facility];  Service: Orthopedics;  Laterality: Left;  . AMPUTATION Bilateral 04/10/2017   Procedure: AMPUTATION BELOW KNEE;  Surgeon: Newt Minion, MD;  Location: [facility];  Service: Orthopedics;  Laterality: Bilateral;  . AMPUTATION Bilateral 02/06/2018   Procedure: AMPUTATION ABOVE KNEE;  Surgeon: Newt Minion, MD;  Location: [facility];  Service: Orthopedics;  Laterality: Bilateral;  . HERNIA REPAIR     "stomach"  . I&D EXTREMITY Right 09/26/2015   Procedure:  IRRIGATION AND DEBRIDEMENT LEG WOUND  VAC PLACEMENT.;  Surgeon: Loel Lofty Dillingham, DO;  Location: [facility];  Service: Plastics;  Laterality: Right;  . INCISION AND DRAINAGE OF WOUND Bilateral 10/20/2016   Procedure: IRRIGATION AND DEBRIDEMENT WOUND BILATERAL;  Surgeon: Edrick Kins, DPM;  Location: [facility];  Service: Podiatry;  Laterality: Bilateral;  . IRRIGATION AND DEBRIDEMENT ABSCESS Bilateral 09/26/2013   Procedure: DEBRIDEMENT ULCERS BILATERAL THIGHS;  Surgeon: Gwenyth Ober, MD;  Location: [facility];  Service: General;  Laterality: Bilateral;  . SKIN BIOPSY Bilateral    shin nodules     OB History   None      Home Medications    Prior to Admission medications   Medication Sig Start Date End Date Taking? Authorizing Provider  busPIRone (BUSPAR) 15 MG tablet Take 1 tablet (15 mg total) by mouth 3 (three) times daily. 03/02/18   Hongalgi, Lenis Dickinson, MD  escitalopram (LEXAPRO) 10 MG tablet Take 2 tablets (20 mg total) by mouth at bedtime. 03/02/18   Hongalgi, Lenis Dickinson, MD  gabapentin (NEURONTIN) 100 MG capsule Take 2 capsules (200 mg total) by mouth 2 (two) times daily. 03/02/18   Hongalgi, Lenis Dickinson, MD  gabapentin (NEURONTIN) 100 MG capsule Take 2 capsules (200 mg total) by mouth 2 (two) times daily for 15 days. 04/01/18 04/16/18  Joy, Shawn C, PA-C  ibuprofen (ADVIL,MOTRIN) 200 MG tablet Take 2 tablets (400 mg total) by mouth every 6 (six) hours as needed for fever, mild pain or moderate pain. 03/02/18   Hongalgi, Lenis Dickinson, MD    Family History Family History  Problem Relation Age of Onset  . Breast cancer Mother        Breast cancer  . Alcohol abuse Mother   . Colon cancer Maternal Aunt 71  . Alcohol abuse Father     Social History Social History   Tobacco Use  . Smoking status: Current Every Day Smoker    Packs/day: 0.12    Years: 38.00    Pack years: 4.56    Types: Cigarettes  . Smokeless tobacco: Never Used  . Tobacco comment: 2 A DAY  Substance Use Topics  . Alcohol use: No     Alcohol/week: 0.0 oz  . Drug use: Yes    Types: "Crack" cocaine, Cocaine    Comment: Smoked crack 06/02/2017     Allergies   Acetaminophen and Lisinopril   Review of Systems Review of Systems  Constitutional: Negative for chills and fever.  HENT: Positive for facial swelling. Negative for drooling, rhinorrhea and trouble swallowing.   Eyes: Negative for pain and discharge.  Respiratory: Negative for shortness of breath.   Cardiovascular: Negative for chest pain.  Gastrointestinal: Negative for abdominal pain, constipation, diarrhea, nausea and vomiting.  Genitourinary: Negative for dysuria and hematuria.  Musculoskeletal: Negative for arthralgias and myalgias.  Skin: Negative for rash.  Allergic/Immunologic: Negative for immunocompromised state.  Neurological: Negative for weakness and numbness.  Psychiatric/Behavioral: Negative for confusion.   All other systems reviewed and are negative for acute change except  as noted in the HPI.    Physical Exam Updated Vital Signs BP (!) 171/112 (BP Location: Right Arm)   Pulse 94   Temp 98 F (36.7 C) (Oral)   Resp 18   SpO2 95%   Physical Exam  Constitutional: She is oriented to person, place, and time. Vital signs are normal. She appears well-developed and well-nourished.  Non-toxic appearance. No distress.  Afebrile, nontoxic, NAD, HTN noted similar to prior visit  HENT:  Head: Normocephalic and atraumatic.  Nose: Nose normal.  Mouth/Throat: Uvula is midline, oropharynx is clear and moist and mucous membranes are normal. No trismus in the jaw. No uvula swelling.  Edentulous Airway patent, no tongue swelling Mild R facial swelling primarily around the periorbital area and R cheek, some on the R lower lip, no appreciable swelling to L face. No drooling or trismus. No erythema or warmth to the swollen areas, no wounds or overlying skin changes. No tenderness.   Eyes: Pupils are equal, round, and reactive to light. Conjunctivae  and EOM are normal. Right eye exhibits no discharge. Left eye exhibits no discharge.  PERRL, EOMI, no nystagmus, no pain with eye movement, no ocular drainage or conjunctival injection. No periorbital erythema or tenderness, no warmth.   Neck: Normal range of motion. Neck supple.  Cardiovascular: Normal rate, regular rhythm, normal heart sounds and intact distal pulses. Exam reveals no gallop and no friction rub.  No murmur heard. Pulmonary/Chest: Effort normal and breath sounds normal. No respiratory distress. She has no decreased breath sounds. She has no wheezes. She has no rhonchi. She has no rales.  Abdominal: Soft. Normal appearance and bowel sounds are normal. She exhibits no distension. There is no tenderness. There is no rigidity, no rebound, no guarding, no CVA tenderness, no tenderness at McBurney's point and negative [name]'s sign.  Musculoskeletal: Normal range of motion.  Bilateral leg amputations  Neurological: She is alert and oriented to person, place, and time. She has normal strength. No sensory deficit.  Skin: Skin is warm, dry and intact. No rash noted.  Facial swelling as mentioned above; no other areas of swelling to remainder of body  Psychiatric: She has a normal mood and affect.  Nursing note and vitals reviewed.    ED Treatments / Results  Labs (all labs ordered are listed, but only abnormal results are displayed) Labs Reviewed - No data to display  EKG None  Radiology No results found.  Procedures Procedures (including critical care time)  Medications Ordered in ED Medications  hydrochlorothiazide (MICROZIDE) capsule 25 mg (25 mg Oral Given 05/07/18 1505)  predniSONE (DELTASONE) tablet 60 mg (60 mg Oral Given 05/07/18 1505)  famotidine (PEPCID) tablet 20 mg (20 mg Oral Given 05/07/18 1505)  diphenhydrAMINE (BENADRYL) capsule 25 mg (25 mg Oral Given 05/07/18 1505)     Initial Impression / Assessment and Plan / ED Course  I have reviewed the triage vital  signs and the nursing notes.  Pertinent labs & imaging results that were available during my care of the patient were reviewed by me and considered in my medical decision making (see chart for details).     57 y.o. female here with facial swelling x5 days, mostly on R side. Unclear etiology. Denies exposures to anything. Of note, also hasn't been taking any of her medications for "a while". On exam, mildly hypertensive which is similar to prior visit, mild facial swelling mostly along R periorbital area and R cheek, somewhat onto R lower lip, not really any  appreciable swelling to the left face, no tongue swelling, airway patent, clear lungs. No erythema or warmth of the areas, eyes clear. EOMI without pain. No other areas of swelling to remainder of body. Doubt infection, doubt nephropathy, likely allergic reaction just unknown to what. Will give pepcid, benadryl, and prednisone, as well as HCTZ which is the last BP med I can find in her chart, and then monitor and reassess. Doubt need for imaging or labs currently.   6:39 PM Pt feeling better and without any worsening/progressing symptoms, facial swelling improving; BP stable/improving. I feel she is safe to go home at this time. Will send home with benadryl/zantac/prednisone. Advised avoidance of allergens/triggers if she can determine what those are. Cool compresses advised. Also discussed DASH diet and will restart HCTZ for BP management, discussed monitoring BP at home and avoidance of triggers that would elevate BP. Discussed f/up with [facility] in 5-7 days for recheck and to establish care, and for ongoing management of HTN/medical conditions. I explained the diagnosis and have given explicit precautions to return to the ER including for any other new or worsening symptoms. The patient understands and accepts the medical plan as it's been dictated and I have answered their questions. Discharge instructions concerning home care and prescriptions have been  given. The patient is STABLE and is discharged to home in good condition.      Final Clinical Impressions(s) / ED Diagnoses   Final diagnoses:  Facial swelling  Allergic reaction, initial encounter  Essential hypertension    ED Discharge Orders        Ordered    hydrochlorothiazide (HYDRODIURIL) 25 MG tablet  Daily     05/07/18 1838    diphenhydrAMINE (BENADRYL) 25 MG tablet  Every 6 hours PRN     05/07/18 1838    ranitidine (ZANTAC) 150 MG tablet  2 times daily     05/07/18 1838    predniSONE (DELTASONE) 20 MG tablet     05/07/18 [address], [place], [place] 05/07/18 1839    Milton Ferguson, MD 05/08/18 [phone]

## 2018-05-07 NOTE — Discharge Instructions (Addendum)
Take Benadryl and zantac as directed, taking your next dose of zantac tonight with dinner. Take prednisone as directed until completed, starting tomorrow since you received today's dose here. Get plenty of rest and drink plenty of fluids. Avoid any known triggers. You may use cool compresses to your face to help with swelling.   Your blood pressure was elevated today. Eat a low salt/low sodium diet, and start taking the prescribed blood pressure medication as directed, starting tomorrow since you received today's dose here. Keep a log of your blood pressure readings from every morning and evening (making sure to give yourself at least 15 minutes of rest prior to checking it) and take it to your doctor's office at your next appointment for ongoing management of your blood pressure. Stay well hydrated and get plenty of rest. Avoid caffeine and other over the counter products that would make your blood pressure go up (such as decongestants, excedrin, etc).   Please followup with the [facility] in 5-7 days to establish care and for recheck of symptoms and for discussion of your diagnoses and further evaluation after today's visit. Return to the ER for changes or worsening symptoms

## 2018-05-07 NOTE — ED Triage Notes (Signed)
She c/o painless facial swelling x 2 days. ?allergic reaction? She is in no distress. She is a double l.e. Amputee.

## 2018-05-07 NOTE — ED Notes (Signed)
Reviewed discharge instructions and prescriptions with pt. Pt verbalized understanding of discharge instructions prior to leaving facility via [facility].

## 2018-05-09 NOTE — Telephone Encounter (Signed)
Thanks Morey Hummingbird!

## 2018-05-16 ENCOUNTER — Telehealth: Payer: Self-pay

## 2018-05-16 NOTE — Telephone Encounter (Signed)
-----   Message from Azzie Glatter, [place] sent at 05/05/2018 10:53 PM EDT ----- Regarding: "Follow Up Appointment" Andria Frames,  Please schedule patient for follow up appointment with me next week. [facility] needs paperwork completed.   Phone number to facility is: [phone].  Thank you.

## 2018-05-16 NOTE — Telephone Encounter (Signed)
Letter will be mailed out as we have been unable to reach patient by phone. Also contact [facility] and let them know that we have been unable to get patient scheduled

## 2018-05-19 ENCOUNTER — Emergency Department (HOSPITAL_COMMUNITY): Payer: Medicaid Other

## 2018-05-19 ENCOUNTER — Inpatient Hospital Stay (HOSPITAL_COMMUNITY)
Admission: EM | Admit: 2018-05-19 | Discharge: 2018-05-23 | DRG: 897 | Disposition: A | Discharge status: Home / Self Care | Payer: Medicaid Other | Attending: Family Medicine | Admitting: Family Medicine

## 2018-05-19 DIAGNOSIS — R21 Rash and other nonspecific skin eruption: Secondary | ICD-10-CM | POA: Diagnosis not present

## 2018-05-19 DIAGNOSIS — F14988 Cocaine use, unspecified with other cocaine-induced disorder: Secondary | ICD-10-CM | POA: Diagnosis present

## 2018-05-19 DIAGNOSIS — Z888 Allergy status to other drugs, medicaments and biological substances status: Secondary | ICD-10-CM

## 2018-05-19 DIAGNOSIS — F332 Major depressive disorder, recurrent severe without psychotic features: Secondary | ICD-10-CM | POA: Diagnosis present

## 2018-05-19 DIAGNOSIS — Z89612 Acquired absence of left leg above knee: Secondary | ICD-10-CM

## 2018-05-19 DIAGNOSIS — I10 Essential (primary) hypertension: Secondary | ICD-10-CM | POA: Diagnosis present

## 2018-05-19 DIAGNOSIS — I509 Heart failure, unspecified: Secondary | ICD-10-CM

## 2018-05-19 DIAGNOSIS — M069 Rheumatoid arthritis, unspecified: Secondary | ICD-10-CM | POA: Diagnosis present

## 2018-05-19 DIAGNOSIS — F14188 Cocaine abuse with other cocaine-induced disorder: Principal | ICD-10-CM | POA: Diagnosis present

## 2018-05-19 DIAGNOSIS — Z681 Body mass index (BMI) 19 or less, adult: Secondary | ICD-10-CM

## 2018-05-19 DIAGNOSIS — I999 Unspecified disorder of circulatory system: Secondary | ICD-10-CM

## 2018-05-19 DIAGNOSIS — I169 Hypertensive crisis, unspecified: Secondary | ICD-10-CM | POA: Diagnosis present

## 2018-05-19 DIAGNOSIS — R740 Nonspecific elevation of levels of transaminase and lactic acid dehydrogenase [LDH]: Secondary | ICD-10-CM | POA: Diagnosis present

## 2018-05-19 DIAGNOSIS — R079 Chest pain, unspecified: Secondary | ICD-10-CM | POA: Diagnosis not present

## 2018-05-19 DIAGNOSIS — F17210 Nicotine dependence, cigarettes, uncomplicated: Secondary | ICD-10-CM | POA: Diagnosis present

## 2018-05-19 DIAGNOSIS — D649 Anemia, unspecified: Secondary | ICD-10-CM | POA: Diagnosis present

## 2018-05-19 DIAGNOSIS — I272 Pulmonary hypertension, unspecified: Secondary | ICD-10-CM | POA: Diagnosis present

## 2018-05-19 DIAGNOSIS — E876 Hypokalemia: Secondary | ICD-10-CM | POA: Diagnosis present

## 2018-05-19 DIAGNOSIS — Z9114 Patient's other noncompliance with medication regimen: Secondary | ICD-10-CM

## 2018-05-19 DIAGNOSIS — E875 Hyperkalemia: Secondary | ICD-10-CM | POA: Diagnosis not present

## 2018-05-19 DIAGNOSIS — Z89611 Acquired absence of right leg above knee: Secondary | ICD-10-CM

## 2018-05-19 DIAGNOSIS — T463X5A Adverse effect of coronary vasodilators, initial encounter: Secondary | ICD-10-CM | POA: Diagnosis not present

## 2018-05-19 DIAGNOSIS — Z79899 Other long term (current) drug therapy: Secondary | ICD-10-CM

## 2018-05-19 DIAGNOSIS — E440 Moderate protein-calorie malnutrition: Secondary | ICD-10-CM

## 2018-05-19 DIAGNOSIS — I340 Nonrheumatic mitral (valve) insufficiency: Secondary | ICD-10-CM | POA: Diagnosis present

## 2018-05-19 DIAGNOSIS — I160 Hypertensive urgency: Secondary | ICD-10-CM | POA: Diagnosis present

## 2018-05-19 LAB — BRAIN NATRIURETIC PEPTIDE: B NATRIURETIC PEPTIDE 5: 3603.1 pg/mL — AB (ref 0.0–100.0)

## 2018-05-19 LAB — COMPREHENSIVE METABOLIC PANEL
ALK PHOS: 95 U/L (ref 38–126)
ALT: 101 U/L — ABNORMAL HIGH (ref 0–44)
ANION GAP: 10 (ref 5–15)
AST: 36 U/L (ref 15–41)
Albumin: 3 g/dL — ABNORMAL LOW (ref 3.5–5.0)
BILIRUBIN TOTAL: 1.7 mg/dL — AB (ref 0.3–1.2)
BUN: 18 mg/dL (ref 6–20)
CALCIUM: 8.8 mg/dL — AB (ref 8.9–10.3)
CO2: 19 mmol/L — ABNORMAL LOW (ref 22–32)
Chloride: 106 mmol/L (ref 98–111)
Creatinine, Ser: 0.74 mg/dL (ref 0.44–1.00)
GFR calc non Af Amer: >60 mL/min (ref >60)
Glucose, Bld: 108 mg/dL — ABNORMAL HIGH (ref 70–99)
POTASSIUM: 3.4 mmol/L — AB (ref 3.5–5.1)
SODIUM: 135 mmol/L (ref 135–145)
TOTAL PROTEIN: 5.7 g/dL — AB (ref 6.5–8.1)

## 2018-05-19 LAB — CBC WITH DIFFERENTIAL/PLATELET
Abs Immature Granulocytes: 0 10*3/uL (ref 0.0–0.1)
BASOS PCT: 0 %
Basophils Absolute: 0 10*3/uL (ref 0.0–0.1)
EOS ABS: 0.1 10*3/uL (ref 0.0–0.7)
Eosinophils Relative: 1 %
HCT: 36.2 % (ref 36.0–46.0)
Hemoglobin: 10.4 g/dL — ABNORMAL LOW (ref 12.0–15.0)
IMMATURE GRANULOCYTES: 0 %
Lymphocytes Relative: 34 %
Lymphs Abs: 2 10*3/uL (ref 0.7–4.0)
MCH: 27.2 pg (ref 26.0–34.0)
MCHC: 28.7 g/dL — ABNORMAL LOW (ref 30.0–36.0)
MCV: 94.5 fL (ref 78.0–100.0)
Monocytes Absolute: 0.5 10*3/uL (ref 0.1–1.0)
Monocytes Relative: 9 %
NEUTROS ABS: 3.3 10*3/uL (ref 1.7–7.7)
NEUTROS PCT: 56 %
PLATELETS: 216 10*3/uL (ref 150–400)
RBC: 3.83 MIL/uL — AB (ref 3.87–5.11)
RDW: 16.2 % — AB (ref 11.5–15.5)
WBC: 5.9 10*3/uL (ref 4.0–10.5)

## 2018-05-19 LAB — I-STAT CG4 LACTIC ACID, ED: LACTIC ACID, VENOUS: 1.33 mmol/L (ref 0.5–1.9)

## 2018-05-19 LAB — LIPASE, BLOOD: Lipase: 23 U/L (ref 11–51)

## 2018-05-19 LAB — TROPONIN I: TROPONIN I: 0.07 ng/mL — AB (ref <0.03)

## 2018-05-19 MED ORDER — MORPHINE SULFATE (PF) 2 MG/ML IV SOLN
2.0000 mg | Freq: Once | INTRAVENOUS | Status: AC
  Administered 2018-05-19: 2 mg via INTRAVENOUS
  Filled 2018-05-19: qty 1

## 2018-05-19 MED ORDER — DM-GUAIFENESIN ER 30-600 MG PO TB12
1.0000 | ORAL_TABLET | Freq: Two times a day (BID) | ORAL | Status: DC
  Administered 2018-05-20 (×2): 1 via ORAL
  Filled 2018-05-19 (×2): qty 1

## 2018-05-19 MED ORDER — NITROGLYCERIN 0.4 MG SL SUBL
0.4000 mg | SUBLINGUAL_TABLET | SUBLINGUAL | Status: AC | PRN
  Administered 2018-05-19 (×3): 0.4 mg via SUBLINGUAL
  Filled 2018-05-19 (×2): qty 1

## 2018-05-19 MED ORDER — FUROSEMIDE 10 MG/ML IJ SOLN
40.0000 mg | Freq: Once | INTRAMUSCULAR | Status: AC
  Administered 2018-05-19: 40 mg via INTRAVENOUS
  Filled 2018-05-19: qty 4

## 2018-05-19 MED ORDER — NICOTINE 7 MG/24HR TD PT24
7.0000 mg | MEDICATED_PATCH | Freq: Every day | TRANSDERMAL | Status: DC
  Administered 2018-05-20 – 2018-05-23 (×5): 7 mg via TRANSDERMAL
  Filled 2018-05-19 (×4): qty 1

## 2018-05-19 MED ORDER — FENTANYL CITRATE (PF) 100 MCG/2ML IJ SOLN
50.0000 ug | Freq: Once | INTRAMUSCULAR | Status: AC
  Administered 2018-05-19: 50 ug via INTRAVENOUS
  Filled 2018-05-19: qty 2

## 2018-05-19 MED ORDER — ASPIRIN 81 MG PO CHEW
324.0000 mg | CHEWABLE_TABLET | Freq: Once | ORAL | Status: AC
  Administered 2018-05-19: 324 mg via ORAL
  Filled 2018-05-19: qty 4

## 2018-05-19 MED ORDER — IPRATROPIUM-ALBUTEROL 0.5-2.5 (3) MG/3ML IN SOLN
3.0000 mL | Freq: Four times a day (QID) | RESPIRATORY_TRACT | Status: DC
  Administered 2018-05-20: 3 mL via RESPIRATORY_TRACT
  Filled 2018-05-19: qty 3

## 2018-05-19 MED ORDER — IPRATROPIUM-ALBUTEROL 0.5-2.5 (3) MG/3ML IN SOLN
3.0000 mL | Freq: Once | RESPIRATORY_TRACT | Status: AC
  Administered 2018-05-19: 3 mL via RESPIRATORY_TRACT
  Filled 2018-05-19: qty 3

## 2018-05-19 NOTE — ED Provider Notes (Signed)
[facility] EMERGENCY DEPARTMENT Provider Note   CSN: [id number] Arrival date & time: 05/19/18  1857     History   Chief Complaint Chief Complaint  Patient presents with  . Chest Pain    HPI [name] is a 57 y.o. female.  HPI  57 year old female with a history of cocaine abuse and vasculitis requiring bilateral AKA presents with chest pain or shortness of breath.  Chest pain started yesterday and is dull and achy under her left breast.  Worse with coughing.  Has been coughing for about 1 week with white and now yellow sputum.  Subjective fever yesterday.  All of her symptoms seem to be worse yesterday.  Also having some diffuse abdominal soreness.  No vomiting but has felt nauseated.  Pain is a 8/10.  She states she was placed on oxygen due to her breathing by EMS but normally does not wear oxygen.  Last cocaine use 1 week ago.  Past Medical History:  Diagnosis Date  . CAP (community acquired pneumonia) 03/2014 X 2  . Cocaine abuse ([place])    ongoing with resultant vaculitis.  . Depression   . Headache    "weekly" (07/29/2016)  . Hypertension   . Inflammatory arthritis   . Migraines    "probably 5-6/yr" (07/29/2016)  . Normocytic anemia    BL Hgb 9.8-12. Last anemia panel 04/2010 - showing Fe 19, ferritin 101.  Pt on monthly B12 injections  . Rheumatoid arthritis(714.0)    patient reported  . 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 Followed by Dr. Louanne Skye    Patient Active Problem List   Diagnosis Date Noted  . Suicidal ideation 02/02/2018  . HCAP (healthcare-associated pneumonia) 02/02/2018  . Acute on chronic respiratory failure with hypoxia ([place]) 02/02/2018  . Sepsis ([place]) 02/02/2018  . Ulcer of amputation stump of lower extremity ([place]) 10/23/2017  . 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 Hypertensive crisis   . Phantom limb pain ([place])   . S/P bilateral  below knee amputation ([place]) 04/13/2017  . Tobacco abuse   . Post-operative pain   . Acute blood loss anemia   . 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 Wound infection 03/30/2017  . AKI (acute kidney injury) ([place]) 02/07/2017  . Acute kidney injury ([place]) 02/06/2017  . Wound healing, delayed   . Chest pain 04/07/2015  . Protein-calorie malnutrition, severe ([place]) 01/15/2015  . MDD (major depressive disorder), recurrent episode, severe ([place]) 10/16/2014  . Cocaine use disorder, severe, dependence ([place]) 10/10/2014  . 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 Essential hypertension 02/26/2010    Past Surgical History:  Procedure Laterality Date  . AMPUTATION Left 05/22/2016   Procedure: AMPUTATION LEFT LONG FINGER;  Surgeon: Marybelle Killings, MD;  Location: [facility];  Service: Orthopedics;  Laterality: Left;  . AMPUTATION Bilateral 04/10/2017   Procedure: AMPUTATION BELOW KNEE;  Surgeon: Newt Minion, MD;  Location: [facility];  Service: Orthopedics;  Laterality: Bilateral;  . AMPUTATION Bilateral 02/06/2018   Procedure: AMPUTATION ABOVE KNEE;  Surgeon: Newt Minion, MD;  Location: [facility];  Service: Orthopedics;  Laterality: Bilateral;  . HERNIA REPAIR     "stomach"  . I&D EXTREMITY Right 09/26/2015   Procedure: IRRIGATION AND DEBRIDEMENT LEG WOUND  VAC PLACEMENT.;  Surgeon: Loel Lofty Dillingham, DO;  Location: [facility];  Service: Plastics;  Laterality: Right;  .  INCISION AND DRAINAGE OF WOUND Bilateral 10/20/2016   Procedure: IRRIGATION AND DEBRIDEMENT WOUND BILATERAL;  Surgeon: Edrick Kins, DPM;  Location: [facility];  Service: Podiatry;  Laterality: Bilateral;  . IRRIGATION AND DEBRIDEMENT ABSCESS Bilateral 09/26/2013   Procedure: DEBRIDEMENT ULCERS BILATERAL THIGHS;  Surgeon: Gwenyth Ober, MD;  Location: [facility];  Service: General;  Laterality: Bilateral;  . SKIN BIOPSY Bilateral    shin nodules     OB History   None      Home  Medications    Prior to Admission medications   Medication Sig Start Date End Date Taking? Authorizing Provider  busPIRone (BUSPAR) 15 MG tablet Take 1 tablet (15 mg total) by mouth 3 (three) times daily. Patient not taking: Reported on 05/19/2018 03/02/18   Modena Jansky, MD  diphenhydrAMINE (BENADRYL) 25 MG tablet Take 1 tablet (25 mg total) by mouth every 6 (six) hours as needed for itching or allergies. Patient not taking: Reported on 05/19/2018 05/07/18   Street, [place], PA-C  escitalopram (LEXAPRO) 10 MG tablet Take 2 tablets (20 mg total) by mouth at bedtime. Patient not taking: Reported on 05/19/2018 03/02/18   Modena Jansky, MD  gabapentin (NEURONTIN) 100 MG capsule Take 2 capsules (200 mg total) by mouth 2 (two) times daily. Patient not taking: Reported on 05/19/2018 03/02/18   Modena Jansky, MD  gabapentin (NEURONTIN) 100 MG capsule Take 2 capsules (200 mg total) by mouth 2 (two) times daily for 15 days. Patient not taking: Reported on 05/19/2018 04/01/18 04/16/18  Lorayne Bender, PA-C  hydrochlorothiazide (HYDRODIURIL) 25 MG tablet Take 1 tablet (25 mg total) by mouth daily. STARTING 05/08/18 Patient not taking: Reported on 05/19/2018 05/08/18   Street, [place], PA-C  ibuprofen (ADVIL,MOTRIN) 200 MG tablet Take 2 tablets (400 mg total) by mouth every 6 (six) hours as needed for fever, mild pain or moderate pain. Patient not taking: Reported on 05/19/2018 03/02/18   Modena Jansky, MD  predniSONE (DELTASONE) 20 MG tablet 3 tabs po daily x 4 days STARTING TOMORROW 05/08/18 Patient not taking: Reported on 05/19/2018 05/07/18   Street, [place], PA-C  ranitidine (ZANTAC) 150 MG tablet Take 1 tablet (150 mg total) by mouth 2 (two) times daily for 5 days. STARTING TONIGHT AT BEDTIME (05/07/18) Patient not taking: Reported on 05/19/2018 05/07/18 05/12/18  Street, [place], PA-C    Family History Family History  Problem Relation Age of Onset  . Breast cancer Mother        Breast cancer  . Alcohol  abuse Mother   . Colon cancer Maternal Aunt 28  . Alcohol abuse Father     Social History Social History   Tobacco Use  . Smoking status: Current Every Day Smoker    Packs/day: 0.12    Years: 38.00    Pack years: 4.56    Types: Cigarettes  . Smokeless tobacco: Never Used  . Tobacco comment: 2 A DAY  Substance Use Topics  . Alcohol use: No    Alcohol/week: 0.0 standard drinks  . Drug use: Yes    Types: "Crack" cocaine, Cocaine    Comment: Smoked crack 06/02/2017     Allergies   Acetaminophen and Lisinopril   Review of Systems Review of Systems  Constitutional: Positive for fever (subjective).  Respiratory: Positive for cough and shortness of breath.   Cardiovascular: Positive for chest pain.  Gastrointestinal: Positive for abdominal pain and nausea. Negative for vomiting.  All other systems reviewed and are negative.  Physical Exam Updated Vital Signs BP (!) 173/113   Pulse 95   Temp (!) 97.5 F (36.4 C) (Oral)   Resp (!) 33   SpO2 97%   Physical Exam  Constitutional: She is oriented to person, place, and time. She appears well-developed and well-nourished.  Non-toxic appearance. She does not appear ill.  HENT:  Head: Normocephalic and atraumatic.  Right Ear: External ear normal.  Left Ear: External ear normal.  Nose: Nose normal.  Eyes: Right eye exhibits no discharge. Left eye exhibits no discharge.  Cardiovascular: Normal rate, regular rhythm and normal heart sounds.  Pulmonary/Chest: Effort normal. No accessory muscle usage. Tachypnea noted. She has decreased breath sounds in the right lower field and the left lower field. She exhibits tenderness.    Abdominal: Soft. There is generalized tenderness (mild).  Neurological: She is alert and oriented to person, place, and time.  Skin: Skin is warm and dry.  Nursing note and vitals reviewed.    ED Treatments / Results  Labs (all labs ordered are listed, but only abnormal results are  displayed) Labs Reviewed  COMPREHENSIVE METABOLIC PANEL - Abnormal; Notable for the following components:      Result Value   Potassium 3.4 (*)    CO2 19 (*)    Glucose, Bld 108 (*)    Calcium 8.8 (*)    Total Protein 5.7 (*)    Albumin 3.0 (*)    ALT 101 (*)    Total Bilirubin 1.7 (*)    All other components within normal limits  CBC WITH DIFFERENTIAL/PLATELET - Abnormal; Notable for the following components:   RBC 3.83 (*)    Hemoglobin 10.4 (*)    MCHC 28.7 (*)    RDW 16.2 (*)    All other components within normal limits  BRAIN NATRIURETIC PEPTIDE - Abnormal; Notable for the following components:   B Natriuretic Peptide 3,603.1 (*)    All other components within normal limits  TROPONIN I - Abnormal; Notable for the following components:   Troponin I 0.07 (*)    All other components within normal limits  LIPASE, BLOOD  I-STAT CG4 LACTIC ACID, ED    EKG EKG Interpretation  Date/Time:  Thursday May 19 2018 18:58:56 EDT Ventricular Rate:  94 PR Interval:    QRS Duration: 96 QT Interval:  427 QTC Calculation: 534 R Axis:   104 Text Interpretation:  Sinus rhythm Probable left atrial enlargement Right axis deviation Nonspecific T abnormalities, anterior leads Prolonged QT interval T wave flattening in V4-6 new since July 2019 Confirmed by Sherwood Gambler [phone]) on 05/19/2018 7:04:21 PM   Radiology Dg Chest 2 View  Result Date: 05/19/2018 CLINICAL DATA:  Shortness of breath and left-sided chest pain for 2 days. EXAM: CHEST - 2 VIEW COMPARISON:  04/26/2018 FINDINGS: Enlarged cardiac silhouette with preferential enlargement of the left ventricle. Calcific atherosclerotic disease of the aorta. Mediastinal contours appear intact. Right pleural effusion with associated compressive atelectasis. Osseous structures are without acute abnormality. Soft tissues are grossly normal. IMPRESSION: Enlarged cardiac silhouette with preferential enlargement of the left chambers. Moderate in  size right pleural effusion. Calcific atherosclerotic disease of the aorta. Electronically Signed   By: Fidela Salisbury M.D.   On: 05/19/2018 21:08    Procedures .Critical Care Performed by: Sherwood Gambler, MD Authorized by: Sherwood Gambler, MD   Critical care provider statement:    Critical care time (minutes):  35   Critical care time was exclusive of:  Separately billable procedures and treating  other patients   Critical care was necessary to treat or prevent imminent or life-threatening deterioration of the following conditions:  Circulatory failure, cardiac failure and respiratory failure   Critical care was time spent personally by me on the following activities:  Development of treatment plan with patient or surrogate, discussions with consultants, evaluation of patient's response to treatment, examination of patient, obtaining history from patient or surrogate, ordering and performing treatments and interventions, ordering and review of laboratory studies, ordering and review of radiographic studies, pulse oximetry, re-evaluation of patient's condition and review of old charts   (including critical care time)  Medications Ordered in ED Medications  nitroGLYCERIN (NITROSTAT) SL tablet 0.4 mg (0.4 mg Sublingual Given 05/19/18 2231)  ipratropium-albuterol (DUONEB) 0.5-2.5 (3) MG/3ML nebulizer solution 3 mL (3 mLs Nebulization Given 05/19/18 1935)  fentaNYL (SUBLIMAZE) injection 50 mcg (50 mcg Intravenous Given 05/19/18 1936)  aspirin chewable tablet 324 mg (324 mg Oral Given 05/19/18 2224)  furosemide (LASIX) injection 40 mg (40 mg Intravenous Given 05/19/18 2234)     Initial Impression / Assessment and Plan / ED Course  I have reviewed the triage vital signs and the nursing notes.  Pertinent labs & imaging results that were available during my care of the patient were reviewed by me and considered in my medical decision making (see chart for details).     Presentation today is  most consistent with acute CHF.  She has a history of grade 1 diastolic dysfunction but this appears to be worse than that.  She is tachypneic but not in distress.  I think a lot of her tachypnea is due to pain but she does have pleural effusion seen on chest x-ray.  She is afebrile and so I think pneumonia is much less likely.  BNP is significantly elevated.  Troponin is mildly elevated and she has some nonspecific T wave changes but no frank ST elevations or depressions.  This is probably secondary to her CHF. She will need this trended. Given ASA. Nitros have helped some and also mildly reduced her BP. Hospitalist to admit.  Final Clinical Impressions(s) / ED Diagnoses   Final diagnoses:  Acute congestive heart failure, unspecified heart failure type [facility])    ED Discharge Orders    None       Sherwood Gambler, MD 05/19/18 2258

## 2018-05-19 NOTE — ED Triage Notes (Signed)
Pt arrived via [facility] from home c/o cp that was reproducible w/ palpation by EMS. Pt was tearful at triage. Pt is alert and oriented x4. EMS 180/100 hr97 100%ra cbg 114. No tx PTA.

## 2018-05-19 NOTE — H&P (Signed)
History and Physical    [name]:[id number] DOB: October 29, 1960 DOA: 05/19/2018  PCP: Patient, No Pcp Per   Patient coming from: Home  Chief Complaint: SOB, Chest pain  HPI: [name] is a 57 y.o. female with medical history significant for HTN, cocaine induced muscular disorder, depression, bilateral AKA's, who presented to the ED with complaints of shortness of breath and chest pain that started 3 days ago, and productive cough. Chest pain is persistent over the past 3 days, left sided, pleuritic-worse with coughing, started while she was lying on the couch.  Cough is productive of white-yellow sputum.  She reports last use of cocaine was 1 week ago, with ongoing tobacco abuse-she smokes both.  Denies IV drug use.  She denies fever or chills.  She has not been checking her weight.   ED Course: Elevated blood pressure to 190/112, tachypneic to 40.  O2 sats greater than 95% on room air.  Two-view chest x-ray moderate sized right pleural effusion, enlarged cardiac silhouette with preferential enlargement of the left chambers.  WBC 5.9.  Stable chronic anemia- ~10.  BNP- markedly elevated 3603.  Elevated troponin  0.07.  Given 40 Lasix twice daily.  EKG-Q waves otherwise nonspecific T wave changes.  Patient given IV Lasix 401 hospitalist called to admit for chest pain, acute CHF.  Review of Systems: As per HPI all other systems reviewed and negative.   Past Medical History:  Diagnosis Date  . CAP (community acquired pneumonia) 03/2014 X 2  . Cocaine abuse ([place])    ongoing with resultant vaculitis.  . Depression   . Headache    "weekly" (07/29/2016)  . Hypertension   . Inflammatory arthritis   . Migraines    "probably 5-6/yr" (07/29/2016)  . Normocytic anemia    BL Hgb 9.8-12. Last anemia panel 04/2010 - showing Fe 19, ferritin 101.  Pt on monthly B12 injections  . Rheumatoid arthritis(714.0)    patient reported  . 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 Followed by Dr. Louanne Skye    Past Surgical History:  Procedure Laterality Date  . AMPUTATION Left 05/22/2016   Procedure: AMPUTATION LEFT LONG FINGER;  Surgeon: Marybelle Killings, MD;  Location: [place];  Service: Orthopedics;  Laterality: Left;  . AMPUTATION Bilateral 04/10/2017   Procedure: AMPUTATION BELOW KNEE;  Surgeon: Newt Minion, MD;  Location: [place];  Service: Orthopedics;  Laterality: Bilateral;  . AMPUTATION Bilateral 02/06/2018   Procedure: AMPUTATION ABOVE KNEE;  Surgeon: Newt Minion, MD;  Location: [facility];  Service: Orthopedics;  Laterality: Bilateral;  . HERNIA REPAIR     "stomach"  . I&D EXTREMITY Right 09/26/2015   Procedure: IRRIGATION AND DEBRIDEMENT LEG WOUND  VAC PLACEMENT.;  Surgeon: Loel Lofty Dillingham, DO;  Location: [place];  Service: Plastics;  Laterality: Right;  . INCISION AND DRAINAGE OF WOUND Bilateral 10/20/2016   Procedure: IRRIGATION AND DEBRIDEMENT WOUND BILATERAL;  Surgeon: Edrick Kins, DPM;  Location: [place];  Service: Podiatry;  Laterality: Bilateral;  . IRRIGATION AND DEBRIDEMENT ABSCESS Bilateral 09/26/2013   Procedure: DEBRIDEMENT ULCERS BILATERAL THIGHS;  Surgeon: Gwenyth Ober, MD;  Location: [place];  Service: General;  Laterality: Bilateral;  . SKIN BIOPSY Bilateral    shin nodules     reports that she has been smoking cigarettes. She has a 4.56 pack-year smoking history. She has never used smokeless tobacco. She reports that she has current or past  drug history. Drugs: "Crack" cocaine and Cocaine. She reports that she does not drink alcohol.  Allergies  Allergen Reactions  . Acetaminophen Swelling and Other (See Comments)    Reaction:  Eyelid swelling  . Lisinopril     UNSPECIFIED REACTION     Family History  Problem Relation Age of Onset  . Breast cancer Mother        Breast cancer  . Alcohol abuse Mother   . Colon cancer Maternal Aunt 46  . Alcohol abuse Father     Prior to Admission medications   Medication  Sig Start Date End Date Taking? Authorizing Provider  busPIRone (BUSPAR) 15 MG tablet Take 1 tablet (15 mg total) by mouth 3 (three) times daily. Patient not taking: Reported on 05/19/2018 03/02/18   Modena Jansky, MD  diphenhydrAMINE (BENADRYL) 25 MG tablet Take 1 tablet (25 mg total) by mouth every 6 (six) hours as needed for itching or allergies. Patient not taking: Reported on 05/19/2018 05/07/18   Street, [place], PA-C  escitalopram (LEXAPRO) 10 MG tablet Take 2 tablets (20 mg total) by mouth at bedtime. Patient not taking: Reported on 05/19/2018 03/02/18   Modena Jansky, MD  gabapentin (NEURONTIN) 100 MG capsule Take 2 capsules (200 mg total) by mouth 2 (two) times daily. Patient not taking: Reported on 05/19/2018 03/02/18   Modena Jansky, MD  gabapentin (NEURONTIN) 100 MG capsule Take 2 capsules (200 mg total) by mouth 2 (two) times daily for 15 days. Patient not taking: Reported on 05/19/2018 04/01/18 04/16/18  Lorayne Bender, PA-C  hydrochlorothiazide (HYDRODIURIL) 25 MG tablet Take 1 tablet (25 mg total) by mouth daily. STARTING 05/08/18 Patient not taking: Reported on 05/19/2018 05/08/18   Street, [place], PA-C  ibuprofen (ADVIL,MOTRIN) 200 MG tablet Take 2 tablets (400 mg total) by mouth every 6 (six) hours as needed for fever, mild pain or moderate pain. Patient not taking: Reported on 05/19/2018 03/02/18   Modena Jansky, MD  predniSONE (DELTASONE) 20 MG tablet 3 tabs po daily x 4 days STARTING TOMORROW 05/08/18 Patient not taking: Reported on 05/19/2018 05/07/18   Street, [place], PA-C  ranitidine (ZANTAC) 150 MG tablet Take 1 tablet (150 mg total) by mouth 2 (two) times daily for 5 days. STARTING TONIGHT AT BEDTIME (05/07/18) Patient not taking: Reported on 05/19/2018 05/07/18 05/12/18  Street, [place], [place]    Physical Exam: Vitals:   05/19/18 1900 05/19/18 2000 05/19/18 2131 05/19/18 2213  BP: (!) 168/108 (!) 190/112 (!) 175/117 (!) 173/113  Pulse: 93 95 93 95  Resp: (!) 40 (!) 32 (!)  33   Temp:      TempSrc:      SpO2: 99% 100% 95% 97%    Constitutional: Mild to moderate discomfort from chest pain Vitals:   05/19/18 1900 05/19/18 2000 05/19/18 2131 05/19/18 2213  BP: (!) 168/108 (!) 190/112 (!) 175/117 (!) 173/113  Pulse: 93 95 93 95  Resp: (!) 40 (!) 32 (!) 33   Temp:      TempSrc:      SpO2: 99% 100% 95% 97%   Eyes: PERRL, lids and conjunctivae normal ENMT: Mucous membranes are moist. Posterior pharynx clear of any exudate or lesions. Neck: normal, supple, no masses, no thyromegaly Respiratory: clear to auscultation bilaterally, reduced breath sounds right lung no wheezing. Normal respiratory effort. No accessory muscle use.  Cardiovascular: Regular rate and rhythm, no murmurs / rubs / gallops.  2+ pedal pulses. No carotid bruits.  Abdomen: no tenderness, no  masses palpated. No hepatosplenomegaly. Bowel sounds positive.  Musculoskeletal: Bilateral above-knee amputations, well-healed stumps. Skin: no rashes, lesions, ulcers. No induration Neurologic: CN 2-12 grossly intact.  Renal extremities Psychiatric: Normal judgment and insight. Alert and oriented x 3. Normal mood.   Labs on Admission: I have personally reviewed following labs and imaging studies  CBC: Recent Labs  Lab 05/19/18 2116  WBC 5.9  NEUTROABS 3.3  HGB 10.4*  HCT 36.2  MCV 94.5  PLT 970   Basic Metabolic Panel: Recent Labs  Lab 05/19/18 2116  NA 135  K 3.4*  CL 106  CO2 19*  GLUCOSE 108*  BUN 18  CREATININE 0.74  CALCIUM 8.8*   GFR: CrCl cannot be calculated (Unknown ideal weight.). Liver Function Tests: Recent Labs  Lab 05/19/18 2116  AST 36  ALT 101*  ALKPHOS 95  BILITOT 1.7*  PROT 5.7*  ALBUMIN 3.0*   Recent Labs  Lab 05/19/18 2116  LIPASE 23   Cardiac Enzymes: Recent Labs  Lab 05/19/18 2116  TROPONINI 0.07*   Urine analysis:    Component Value Date/Time   COLORURINE AMBER (A) 01/20/2018 1020   APPEARANCEUR HAZY (A) 01/20/2018 1020   LABSPEC 1.026  01/20/2018 1020   PHURINE 5.0 01/20/2018 1020   GLUCOSEU NEGATIVE 01/20/2018 1020   HGBUR LARGE (A) 01/20/2018 1020   BILIRUBINUR NEGATIVE 01/20/2018 1020   BILIRUBINUR small 04/29/2015 1533   KETONESUR NEGATIVE 01/20/2018 1020   PROTEINUR >=300 (A) 01/20/2018 1020   UROBILINOGEN 1.0 07/26/2017 1550   NITRITE NEGATIVE 01/20/2018 1020   LEUKOCYTESUR NEGATIVE 01/20/2018 1020    Radiological Exams on Admission: Dg Chest 2 View  Result Date: 05/19/2018 CLINICAL DATA:  Shortness of breath and left-sided chest pain for 2 days. EXAM: CHEST - 2 VIEW COMPARISON:  04/26/2018 FINDINGS: Enlarged cardiac silhouette with preferential enlargement of the left ventricle. Calcific atherosclerotic disease of the aorta. Mediastinal contours appear intact. Right pleural effusion with associated compressive atelectasis. Osseous structures are without acute abnormality. Soft tissues are grossly normal. IMPRESSION: Enlarged cardiac silhouette with preferential enlargement of the left chambers. Moderate in size right pleural effusion. Calcific atherosclerotic disease of the aorta. Electronically Signed   By: Fidela Salisbury M.D.   On: 05/19/2018 21:08    EKG: Independently reviewed.  Q waves 2 3 aVF inferior lateral leads.  Nonspecific T wave changes V2 V3.  Assessment/Plan Principal Problem:   Chest pain Active Problems:   Essential hypertension   Cocaine-induced vascular disorder (HCC)   MDD (major depressive disorder), recurrent episode, severe ([place])   Hypertensive crisis  Dyspnea - with chest pain. Ongoing cocaine abuse despite bilateral TKAs from cocaine induced vascular disorder.  Last cocaine use 1 week ago.  Last echo-06/2017 EF 55 to 60%, G1DD.  Not on diuretics at home.  BNP markedly elevated 3603.  EKG with nonspecific T wave changes V2 V3, 1, AVL.  Troponin 0.07, prior elevation to 0.09. 2-view chest x-ray-moderate right side pleural effusion.  Differentials-cocaine induced vasospasm, congestive  heart failure -Tropes x2  -Echocardiogram -Continue Lasix 40 twice daily -Strict input output, Daily weights -BMP a.m. -EKG a.m. -Duo nebs mucolytic's - nitroglycerin  Cocaine induced vascular disorder, tobacco abuse-productive cough 3 days, dyspnea, no wheeze.  Ongoing cocaine use. -UDS -Nicotine patch  Bilateral above-knee amputations - resides with her brother.   HTN-elevated.  Noncompliance with home medications- HCTZ 26m -Will start norvasc 565mfor now, as she is now on diuretic Lasix -Would avoid beta-blockers, in the setting of cocaine abuse  DVT prophylaxis: Lovenox Code Status: full Family Communication: None at bedside Disposition Plan: Per rounding team Consults called: none Admission status: Inpt, step down, with persistent chest pain, inpatients as she is requiring stepdown level of care.   Bethena Roys MD [facility] Pager [phone] From 6PM-2AM.  Otherwise please contact night-coverage [email or web address] Password [facility]  05/19/2018, 11:20 PM

## 2018-05-20 ENCOUNTER — Inpatient Hospital Stay (HOSPITAL_COMMUNITY): Payer: Medicaid Other

## 2018-05-20 ENCOUNTER — Other Ambulatory Visit: Payer: Self-pay

## 2018-05-20 ENCOUNTER — Encounter (HOSPITAL_COMMUNITY): Payer: Self-pay | Admitting: General Practice

## 2018-05-20 DIAGNOSIS — F14988 Cocaine use, unspecified with other cocaine-induced disorder: Secondary | ICD-10-CM

## 2018-05-20 DIAGNOSIS — R21 Rash and other nonspecific skin eruption: Secondary | ICD-10-CM | POA: Diagnosis not present

## 2018-05-20 DIAGNOSIS — F17210 Nicotine dependence, cigarettes, uncomplicated: Secondary | ICD-10-CM | POA: Diagnosis present

## 2018-05-20 DIAGNOSIS — R079 Chest pain, unspecified: Secondary | ICD-10-CM | POA: Diagnosis not present

## 2018-05-20 DIAGNOSIS — I340 Nonrheumatic mitral (valve) insufficiency: Secondary | ICD-10-CM | POA: Diagnosis present

## 2018-05-20 DIAGNOSIS — I999 Unspecified disorder of circulatory system: Secondary | ICD-10-CM | POA: Diagnosis not present

## 2018-05-20 DIAGNOSIS — R740 Nonspecific elevation of levels of transaminase and lactic acid dehydrogenase [LDH]: Secondary | ICD-10-CM | POA: Diagnosis present

## 2018-05-20 DIAGNOSIS — M069 Rheumatoid arthritis, unspecified: Secondary | ICD-10-CM | POA: Diagnosis present

## 2018-05-20 DIAGNOSIS — I169 Hypertensive crisis, unspecified: Secondary | ICD-10-CM

## 2018-05-20 DIAGNOSIS — I272 Pulmonary hypertension, unspecified: Secondary | ICD-10-CM | POA: Diagnosis present

## 2018-05-20 DIAGNOSIS — E876 Hypokalemia: Secondary | ICD-10-CM | POA: Diagnosis present

## 2018-05-20 DIAGNOSIS — I361 Nonrheumatic tricuspid (valve) insufficiency: Secondary | ICD-10-CM

## 2018-05-20 DIAGNOSIS — E875 Hyperkalemia: Secondary | ICD-10-CM | POA: Diagnosis not present

## 2018-05-20 DIAGNOSIS — I509 Heart failure, unspecified: Secondary | ICD-10-CM | POA: Diagnosis not present

## 2018-05-20 DIAGNOSIS — Z89612 Acquired absence of left leg above knee: Secondary | ICD-10-CM | POA: Diagnosis not present

## 2018-05-20 DIAGNOSIS — I10 Essential (primary) hypertension: Secondary | ICD-10-CM | POA: Diagnosis not present

## 2018-05-20 DIAGNOSIS — Z888 Allergy status to other drugs, medicaments and biological substances status: Secondary | ICD-10-CM | POA: Diagnosis not present

## 2018-05-20 DIAGNOSIS — I5031 Acute diastolic (congestive) heart failure: Secondary | ICD-10-CM | POA: Diagnosis not present

## 2018-05-20 DIAGNOSIS — T463X5A Adverse effect of coronary vasodilators, initial encounter: Secondary | ICD-10-CM | POA: Diagnosis not present

## 2018-05-20 DIAGNOSIS — I160 Hypertensive urgency: Secondary | ICD-10-CM | POA: Diagnosis present

## 2018-05-20 DIAGNOSIS — D649 Anemia, unspecified: Secondary | ICD-10-CM | POA: Diagnosis present

## 2018-05-20 DIAGNOSIS — E440 Moderate protein-calorie malnutrition: Secondary | ICD-10-CM

## 2018-05-20 DIAGNOSIS — Z681 Body mass index (BMI) 19 or less, adult: Secondary | ICD-10-CM | POA: Diagnosis not present

## 2018-05-20 DIAGNOSIS — F332 Major depressive disorder, recurrent severe without psychotic features: Secondary | ICD-10-CM | POA: Diagnosis present

## 2018-05-20 DIAGNOSIS — Z89611 Acquired absence of right leg above knee: Secondary | ICD-10-CM | POA: Diagnosis not present

## 2018-05-20 DIAGNOSIS — Z9114 Patient's other noncompliance with medication regimen: Secondary | ICD-10-CM | POA: Diagnosis not present

## 2018-05-20 DIAGNOSIS — F14188 Cocaine abuse with other cocaine-induced disorder: Secondary | ICD-10-CM | POA: Diagnosis present

## 2018-05-20 DIAGNOSIS — Z79899 Other long term (current) drug therapy: Secondary | ICD-10-CM | POA: Diagnosis not present

## 2018-05-20 LAB — BASIC METABOLIC PANEL
Anion gap: 11 (ref 5–15)
BUN: 15 mg/dL (ref 6–20)
CHLORIDE: 105 mmol/L (ref 98–111)
CO2: 20 mmol/L — ABNORMAL LOW (ref 22–32)
Calcium: 8.7 mg/dL — ABNORMAL LOW (ref 8.9–10.3)
Creatinine, Ser: 0.74 mg/dL (ref 0.44–1.00)
GFR calc non Af Amer: >60 mL/min (ref >60)
Glucose, Bld: 176 mg/dL — ABNORMAL HIGH (ref 70–99)
Potassium: 3.3 mmol/L — ABNORMAL LOW (ref 3.5–5.1)
SODIUM: 136 mmol/L (ref 135–145)

## 2018-05-20 LAB — ECHOCARDIOGRAM COMPLETE: Weight: 1400.36 oz

## 2018-05-20 LAB — LIPID PANEL
CHOLESTEROL: 164 mg/dL (ref 0–200)
HDL: 40 mg/dL — AB (ref >40)
LDL Cholesterol: 116 mg/dL — ABNORMAL HIGH (ref 0–99)
Total CHOL/HDL Ratio: 4.1 RATIO
Triglycerides: 40 mg/dL (ref <150)
VLDL: 8 mg/dL (ref 0–40)

## 2018-05-20 LAB — RAPID URINE DRUG SCREEN, HOSP PERFORMED
AMPHETAMINES: NOT DETECTED
BENZODIAZEPINES: NOT DETECTED
Barbiturates: NOT DETECTED
COCAINE: POSITIVE — AB
OPIATES: NOT DETECTED
TETRAHYDROCANNABINOL: NOT DETECTED

## 2018-05-20 LAB — TROPONIN I
Troponin I: 0.05 ng/mL (ref <0.03)
Troponin I: 0.04 ng/mL (ref <0.03)

## 2018-05-20 LAB — TSH: TSH: 1.93 u[IU]/mL (ref 0.350–4.500)

## 2018-05-20 LAB — GLUCOSE, CAPILLARY
GLUCOSE-CAPILLARY: 134 mg/dL — AB (ref 70–99)
Glucose-Capillary: 166 mg/dL — ABNORMAL HIGH (ref 70–99)

## 2018-05-20 LAB — MRSA PCR SCREENING: MRSA BY PCR: NEGATIVE

## 2018-05-20 MED ORDER — HEPARIN SODIUM (PORCINE) 5000 UNIT/ML IJ SOLN
5000.0000 [IU] | Freq: Three times a day (TID) | INTRAMUSCULAR | Status: DC
  Administered 2018-05-21 – 2018-05-23 (×8): 5000 [IU] via SUBCUTANEOUS
  Filled 2018-05-20 (×8): qty 1

## 2018-05-20 MED ORDER — IBUPROFEN 200 MG PO TABS
200.0000 mg | ORAL_TABLET | Freq: Four times a day (QID) | ORAL | Status: DC | PRN
  Administered 2018-05-21 (×2): 400 mg via ORAL
  Filled 2018-05-20 (×2): qty 2

## 2018-05-20 MED ORDER — FUROSEMIDE 10 MG/ML IJ SOLN
40.0000 mg | Freq: Two times a day (BID) | INTRAMUSCULAR | Status: DC
  Administered 2018-05-20 – 2018-05-21 (×3): 40 mg via INTRAVENOUS
  Filled 2018-05-20 (×3): qty 4

## 2018-05-20 MED ORDER — INSULIN ASPART 100 UNIT/ML ~~LOC~~ SOLN: 0.0000 [IU] | Freq: Three times a day (TID) | SUBCUTANEOUS | Status: DC

## 2018-05-20 MED ORDER — PERFLUTREN LIPID MICROSPHERE
INTRAVENOUS | Status: AC
  Filled 2018-05-20: qty 10

## 2018-05-20 MED ORDER — ONDANSETRON HCL 4 MG/2ML IJ SOLN: 4.0000 mg | Freq: Four times a day (QID) | INTRAMUSCULAR | Status: DC | PRN

## 2018-05-20 MED ORDER — AMLODIPINE BESYLATE 10 MG PO TABS
10.0000 mg | ORAL_TABLET | Freq: Every day | ORAL | Status: DC
  Administered 2018-05-21 – 2018-05-23 (×3): 10 mg via ORAL
  Filled 2018-05-20 (×3): qty 1

## 2018-05-20 MED ORDER — ONDANSETRON HCL 4 MG PO TABS: 4.0000 mg | ORAL_TABLET | Freq: Four times a day (QID) | ORAL | Status: DC | PRN

## 2018-05-20 MED ORDER — AMLODIPINE BESYLATE 5 MG PO TABS
5.0000 mg | ORAL_TABLET | Freq: Every day | ORAL | Status: DC
  Administered 2018-05-20: 5 mg via ORAL
  Filled 2018-05-20 (×2): qty 1

## 2018-05-20 MED ORDER — DM-GUAIFENESIN ER 30-600 MG PO TB12
1.0000 | ORAL_TABLET | Freq: Two times a day (BID) | ORAL | Status: DC | PRN
Start: 2018-05-20 — End: 2018-05-22

## 2018-05-20 MED ORDER — POTASSIUM CHLORIDE CRYS ER 20 MEQ PO TBCR
40.0000 meq | EXTENDED_RELEASE_TABLET | Freq: Once | ORAL | Status: AC
  Administered 2018-05-20: 40 meq via ORAL
  Filled 2018-05-20: qty 2

## 2018-05-20 MED ORDER — IPRATROPIUM-ALBUTEROL 0.5-2.5 (3) MG/3ML IN SOLN: 3.0000 mL | Freq: Four times a day (QID) | RESPIRATORY_TRACT | Status: DC | PRN

## 2018-05-20 MED ORDER — INSULIN ASPART 100 UNIT/ML ~~LOC~~ SOLN: 0.0000 [IU] | Freq: Every day | SUBCUTANEOUS | Status: DC

## 2018-05-20 MED ORDER — NITROGLYCERIN 0.4 MG/HR TD PT24
0.4000 mg | MEDICATED_PATCH | Freq: Every day | TRANSDERMAL | Status: DC
  Administered 2018-05-20 – 2018-05-21 (×2): 0.4 mg via TRANSDERMAL
  Filled 2018-05-20 (×3): qty 1

## 2018-05-20 MED ORDER — BISACODYL 5 MG PO TBEC
5.0000 mg | DELAYED_RELEASE_TABLET | Freq: Every day | ORAL | Status: DC | PRN
  Administered 2018-05-20: 5 mg via ORAL
  Filled 2018-05-20: qty 1

## 2018-05-20 MED ORDER — BOOST PLUS PO LIQD
237.0000 mL | Freq: Three times a day (TID) | ORAL | Status: DC
  Administered 2018-05-20 – 2018-05-22 (×6): 237 mL via ORAL
  Filled 2018-05-20 (×8): qty 237

## 2018-05-20 MED ORDER — ACETAMINOPHEN 325 MG PO TABS: 650.0000 mg | ORAL_TABLET | Freq: Four times a day (QID) | ORAL | Status: DC | PRN

## 2018-05-20 MED ORDER — ENOXAPARIN SODIUM 40 MG/0.4ML ~~LOC~~ SOLN
40.0000 mg | SUBCUTANEOUS | Status: DC
  Administered 2018-05-20: 40 mg via SUBCUTANEOUS
  Filled 2018-05-20: qty 0.4

## 2018-05-20 MED ORDER — HYDRALAZINE HCL 20 MG/ML IJ SOLN
10.0000 mg | Freq: Once | INTRAMUSCULAR | Status: AC
  Administered 2018-05-20: 10 mg via INTRAVENOUS
  Filled 2018-05-20: qty 1

## 2018-05-20 MED ORDER — ACETAMINOPHEN 650 MG RE SUPP: 650.0000 mg | Freq: Four times a day (QID) | RECTAL | Status: DC | PRN

## 2018-05-20 MED ORDER — ADULT MULTIVITAMIN W/MINERALS CH
1.0000 | ORAL_TABLET | Freq: Every day | ORAL | Status: DC
  Administered 2018-05-20 – 2018-05-22 (×3): 1 via ORAL
  Filled 2018-05-20 (×3): qty 1

## 2018-05-20 MED ORDER — TRAMADOL HCL 50 MG PO TABS
50.0000 mg | ORAL_TABLET | Freq: Four times a day (QID) | ORAL | Status: DC | PRN
  Administered 2018-05-20 – 2018-05-21 (×2): 50 mg via ORAL
  Filled 2018-05-20 (×2): qty 1

## 2018-05-20 MED ORDER — ALBUTEROL SULFATE (2.5 MG/3ML) 0.083% IN NEBU: 2.5000 mg | INHALATION_SOLUTION | RESPIRATORY_TRACT | Status: DC | PRN

## 2018-05-20 MED ORDER — TRAMADOL HCL 50 MG PO TABS
50.0000 mg | ORAL_TABLET | Freq: Once | ORAL | Status: AC
  Administered 2018-05-20: 50 mg via ORAL
  Filled 2018-05-20: qty 1

## 2018-05-20 NOTE — Progress Notes (Signed)
[facility] TEAM 1 - Stepdown/ICU TEAM  [name]  LTJ:[id number] DOB: 1961/05/27 DOA: 05/19/2018 PCP: Patient, No Pcp Per    Brief Narrative:  57 y.o. female with a hx of HTN, cocaine abuse, depression, and bilateral AKA's who presented w/ shortness of breath and chest pain for 3 days.  In the ED she was found to have a BP of 190/112, tachypneic to 40, but O2 sats greater than 95% on RA. CXR noted a moderate sized right pleural effusion. BNP 3603. EKG-Q waves otherwise nonspecific T wave changes.    Subjective: Resting comfortably in bed.  Opens eyes to voice, but chooses not to answer some questions.  Denies current pain. Is not in any resp distress.    Assessment & Plan:  Chest pain Cards evaluating - extent of w/u to be determined once results of TTE are avialable   Dyspnea  sats stable on RA w/ no further dyspnea at this time   Uncontrolled HTN BP remains variable - follow trend for now w/ probable need to adjust meds again in AM  Cocaine abuse  Cont to counsel pt that abstinence is an absolute must   Vasculitis - Levamisole toxicity v/s autoimmune d/o  B LE amputations  Mild hypokalemia  Likely due to poor nutrition - replace - check Mg and Phos   Tobacco abuse  Cont to counsel on absolute need to stop smoking  DVT prophylaxis: lovenox  Code Status: FULL CODE Family Communication: no family present at time of exam  Disposition Plan:   Consultants:  Cardiology  Antimicrobials:  none   Objective: Blood pressure (!) 136/113, pulse 92, temperature 98 F (36.7 C), temperature source Oral, resp. rate (!) 24, weight 39.7 kg, SpO2 98 %.  Intake/Output Summary (Last 24 hours) at 05/20/2018 1354 Last data filed at 05/20/2018 1347 Gross per 24 hour  Intake 720 ml  Output 3350 ml  Net -2630 ml   Filed Weights   05/20/18 0155 05/20/18 0534  Weight: 39.7 kg 39.7 kg    Examination: General: No acute respiratory distress Lungs: Clear to auscultation bilaterally  without wheezes or crackles Cardiovascular: Regular rate and rhythm without murmur gallop or rub normal S1 and S2 Abdomen: Nontender, nondistended, soft, bowel sounds positive, no rebound, no ascites, no appreciable mass Extremities: No significant cyanosis, clubbing, or edema bilateral lower extremities  CBC: Recent Labs  Lab 05/19/18 2116  WBC 5.9  NEUTROABS 3.3  HGB 10.4*  HCT 36.2  MCV 94.5  PLT 076   Basic Metabolic Panel: Recent Labs  Lab 05/19/18 2116 05/20/18 0836  NA 135 136  K 3.4* 3.3*  CL 106 105  CO2 19* 20*  GLUCOSE 108* 176*  BUN 18 15  CREATININE 0.74 0.74  CALCIUM 8.8* 8.7*   GFR: Estimated Creatinine Clearance: 49.8 mL/min (by C-G formula based on SCr of 0.74 mg/dL).  Liver Function Tests: Recent Labs  Lab 05/19/18 2116  AST 36  ALT 101*  ALKPHOS 95  BILITOT 1.7*  PROT 5.7*  ALBUMIN 3.0*   Recent Labs  Lab 05/19/18 2116  LIPASE 23    Cardiac Enzymes: Recent Labs  Lab 05/19/18 2116 05/20/18 0315 05/20/18 0836  TROPONINI 0.07* 0.05* 0.04*    HbA1C: Hgb A1c MFr Bld  Date/Time Value Ref Range Status  03/30/2017 07:44 PM 5.5 4.8 - 5.6 % Final    Comment:    (NOTE)         Pre-diabetes: 5.7 - 6.4  Diabetes: >6.4         Glycemic control for adults with diabetes: <7.0   04/21/2015 04:08 PM 5.1 4.8 - 5.6 % Final    Comment:    (NOTE)         Pre-diabetes: 5.7 - 6.4         Diabetes: >6.4         Glycemic control for adults with diabetes: <7.0      Recent Results (from the past 240 hour(s))  MRSA PCR Screening     Status: None   Collection Time: 05/20/18  1:48 AM  Result Value Ref Range Status   MRSA by PCR NEGATIVE NEGATIVE Final    Comment:        The GeneXpert MRSA Assay (FDA approved for NASAL specimens only), is one component of a comprehensive MRSA colonization surveillance program. It is not intended to diagnose MRSA infection nor to guide or monitor treatment for MRSA infections. Performed at [facility], [place] [address], [place], [place] [address]      Scheduled Meds: . amLODipine  5 mg Oral Daily  . dextromethorphan-guaiFENesin  1 tablet Oral BID  . enoxaparin (LOVENOX) injection  40 mg Subcutaneous Q24H  . furosemide  40 mg Intravenous BID  . nicotine  7 mg Transdermal Daily  . nitroGLYCERIN  0.4 mg Transdermal Daily  . perflutren lipid microspheres (DEFINITY) IV suspension        LOS: 0 days   Cherene Altes, MD [facility] Office  [phone] Pager - Text Page per Amion  If 7PM-7AM, please contact night-coverage per Amion 05/20/2018, 1:54 PM

## 2018-05-20 NOTE — Progress Notes (Signed)
Initial Nutrition Assessment  DOCUMENTATION CODES:   Non-severe (moderate) malnutrition in context of chronic illness  INTERVENTION:    Boost Plus PO BID, each supplement provides 360 kcal and 14 gm protein  MVI daily  NUTRITION DIAGNOSIS:   Moderate Malnutrition related to chronic illness(drug abuse) as evidenced by mild fat depletion, moderate fat depletion, mild muscle depletion, moderate muscle depletion.  GOAL:   Patient will meet greater than or equal to 90% of their needs  MONITOR:   PO intake, Supplement acceptance  REASON FOR ASSESSMENT:   Malnutrition Screening Tool    ASSESSMENT:   57 yo female with PMH of cocaine abuse, HTN, anemia, CAP who was admitted on 8/15 with chest pain and SOB.   Patient sleeping during RD visit. She awakened briefly, but unable to provide much information. She states that she likes to drink chocolate Boost Plus.  Labs reviewed. Potassium 3.3 (L) Medications reviewed and include Lasix.  Per review of weight encounters, weight has been stable since bilateral AKA procedure on 02/06/2018.   NUTRITION - FOCUSED PHYSICAL EXAM:    Most Recent Value  Orbital Region  Mild depletion  Upper Arm Region  Severe depletion  Thoracic and Lumbar Region  Moderate depletion  Buccal Region  Mild depletion  Temple Region  Mild depletion  Clavicle Bone Region  Moderate depletion  Clavicle and Acromion Bone Region  Moderate depletion  Scapular Bone Region  Moderate depletion  Dorsal Hand  Moderate depletion  Patellar Region  Unable to assess  Anterior Thigh Region  Unable to assess  Posterior Calf Region  Unable to assess  Edema (RD Assessment)  Unable to assess  Hair  Reviewed  Eyes  Unable to assess  Mouth  Unable to assess  Skin  Reviewed  Nails  Reviewed       Diet Order:   Diet Order            Diet Heart Room service appropriate? Yes; Fluid consistency: Thin  Diet effective now              EDUCATION NEEDS:   No  education needs have been identified at this time  Skin:  Skin Assessment: Reviewed RN Assessment  Last BM:  Unknown  Height:   Ht Readings from Last 1 Encounters:  05/20/18 5' 1"  (1.549 m)    Weight:   Wt Readings from Last 1 Encounters:  05/20/18 39.7 kg    Ideal Body Weight:  40 kg  BMI:  Body mass index is 16.54 kg/m.  Estimated Nutritional Needs:   Kcal:  1300-1500  Protein:  65-75 gm  Fluid:  1.3-1.5 L    Molli Barrows, RD, LDN, CNSC Pager [phone] After Hours Pager [phone]

## 2018-05-20 NOTE — Progress Notes (Signed)
  Echocardiogram 2D Echocardiogram has been performed.  Jennette Dubin 05/20/2018, 12:34 PM

## 2018-05-20 NOTE — Consult Note (Signed)
Cardiology Consult    [name] MRN: [id number], DOB/AGE: May 15, 1961   Admit date: 05/19/2018 Date of Consult: 05/20/2018  Primary Physician: Patient, No Pcp Per Primary Cardiologist: No primary care provider on file. Requesting Provider: Dr. Regenia Skeeter  Patient Profile    [name] is a 57 y.o. female with a history of HTN, cocaine abuse, vasculitis s/p b/l AKA, normocytic anemia, and patient reported RA who is being seen today for the evaluation of chest pain at the request of Dr. Regenia Skeeter.  Past Medical History   Past Medical History:  Diagnosis Date  . CAP (community acquired pneumonia) 03/2014 X 2  . Cocaine abuse ([place])    ongoing with resultant vaculitis.  . Depression   . Headache    "weekly" (07/29/2016)  . Hypertension   . Inflammatory arthritis   . Migraines    "probably 5-6/yr" (07/29/2016)  . Normocytic anemia    BL Hgb 9.8-12. Last anemia panel 04/2010 - showing Fe 19, ferritin 101.  Pt on monthly B12 injections  . Rheumatoid arthritis(714.0)    patient reported  . 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 Followed by Dr. Louanne Skye    Past Surgical History:  Procedure Laterality Date  . AMPUTATION Left 05/22/2016   Procedure: AMPUTATION LEFT LONG FINGER;  Surgeon: Marybelle Killings, MD;  Location: [place];  Service: Orthopedics;  Laterality: Left;  . AMPUTATION Bilateral 04/10/2017   Procedure: AMPUTATION BELOW KNEE;  Surgeon: Newt Minion, MD;  Location: [place];  Service: Orthopedics;  Laterality: Bilateral;  . AMPUTATION Bilateral 02/06/2018   Procedure: AMPUTATION ABOVE KNEE;  Surgeon: Newt Minion, MD;  Location: [place];  Service: Orthopedics;  Laterality: Bilateral;  . HERNIA REPAIR     "stomach"  . I&D EXTREMITY Right 09/26/2015   Procedure: IRRIGATION AND DEBRIDEMENT LEG WOUND  VAC PLACEMENT.;  Surgeon: Loel Lofty Dillingham, DO;  Location: [place];  Service: Plastics;  Laterality: Right;  .  INCISION AND DRAINAGE OF WOUND Bilateral 10/20/2016   Procedure: IRRIGATION AND DEBRIDEMENT WOUND BILATERAL;  Surgeon: Edrick Kins, DPM;  Location: [place];  Service: Podiatry;  Laterality: Bilateral;  . IRRIGATION AND DEBRIDEMENT ABSCESS Bilateral 09/26/2013   Procedure: DEBRIDEMENT ULCERS BILATERAL THIGHS;  Surgeon: Gwenyth Ober, MD;  Location: [place];  Service: General;  Laterality: Bilateral;  . SKIN BIOPSY Bilateral    shin nodules     Allergies  Allergies  Allergen Reactions  . Acetaminophen Swelling and Other (See Comments)    Reaction:  Eyelid swelling  . Lisinopril     UNSPECIFIED REACTION     History of Present Illness    57 yo female with PMH as above presented to [facility] ED with non-radiating, pleuritic, dull chest pain that started at rest and was associated SOB, nausea, & abdominal pain. She states the pain started after 7 days of coughing (~ 05/15/2018) and while lying on the couch & watching television. This chest pain reportedly continued and worsened over the next three days, at which time she sought medical attention (05/18/2018). She describes the chest pain as left sided, pleuritic & made worse with cough and changes in position. She states it is dull & achy pain located under her left breast and without radiation. She rates the pain 10/10 at its worse and currently 7/10. She does report emesis x1, but further questioning clarifies this as productive cough, producing yellow sputum. She  reports constipation and inability to urinate for days leading up to the chest pain. She also reports edema and tenderness associated with her b/l AKA, as well as a muscular knot that recently appeared at the right side amputation. She denies fever, chills, and recent sick contact. She denies recent travel. She reports last cocaine use ~7 days ago and before the start of her cough. Patient is a current smoker (0.12 pk/day). Allergies include acetaminophen and lisinopril. Patient also  states she is not taking home diuretic of HCTZ 7m po qd. She is still currently experiencing pleuritic chest pain. She reports a history of heart problems but is unable to provide further detail with most recent 2018 echo below and July hospitalization encounter documented as similar chest pain.  In the ED, vital signs significant for BP 190/112- 173/113, HR 93, T 97.5, RR 40-33, SpO2 95-97% ORA. Labs significant for K+ 3.4, glucose 108, Ca 8.8, total protein 5.7, albumin 3.0, ALT 101, total bilirubin 1.7, stable chronic anemia with Hgb 10.4, BNP elevated at 3,603.1, and initial troponin 0.07. WBC 5.9. EKG NSR, 94 bpm, LAE possible, RAD, non-specific t-wave abnormalities, prolonged QT interval, T wave flattening in V4-6 new since EKG July 2019.  CXR read as enlarged cardiac silhouette with preferential enlargement of the left ventricle, calcific atherosclerotic disease of the aorta; R pleural effusion with associated compressive atelectasis. In the ED, she received nitrostat SL tab 0.464m duoneb, fentanyl, ASA 32450mlasix IV 26m72md.  Troponin 0.07  0.05 since admission. On exam, patient chest, abdomen, and b/l AKA TTP on exam and patient appears in pain with SOB. Of note, chest tenderness is different and in addition to that reported as the pleuritic chest pain.   Inpatient Medications    . amLODipine  5 mg Oral Daily  . dextromethorphan-guaiFENesin  1 tablet Oral BID  . enoxaparin (LOVENOX) injection  40 mg Subcutaneous Q24H  . furosemide  40 mg Intravenous BID  . ipratropium-albuterol  3 mL Nebulization Q6H  . nicotine  7 mg Transdermal Daily  . nitroGLYCERIN  0.4 mg Transdermal Daily    Family History    Family History  Problem Relation Age of Onset  . Breast cancer Mother        Breast cancer  . Alcohol abuse Mother   . Colon cancer Maternal Aunt 50  85Alcohol abuse Father    She indicated that the status of her mother is unknown. She indicated that the status of her father is  unknown. She indicated that the status of her maternal aunt is unknown.   Social History    Social History   Socioeconomic History  . Marital status: Single    Spouse name: Not on file  . Number of children: 0  . Years of education: 11th grade  . Highest education level: Not on file  Occupational History  . Occupation: Disability    Comment: since 2011, due to her rheumatoid arthritis  Social Needs  . Financial resource strain: Not very hard  . Food insecurity:    Worry: Sometimes true    Inability: Sometimes true  . Transportation needs:    Medical: Yes    Non-medical: Yes  Tobacco Use  . Smoking status: Current Every Day Smoker    Packs/day: 0.12    Years: 38.00    Pack years: 4.56    Types: Cigarettes  . Smokeless tobacco: Never Used  . Tobacco comment: 2 A DAY  Substance and Sexual Activity  . Alcohol  use: No    Alcohol/week: 0.0 standard drinks  . Drug use: Yes    Types: "Crack" cocaine, Cocaine    Comment: Smoked crack 06/02/2017  . Sexual activity: Not Currently  Lifestyle  . Physical activity:    Days per week: 7 days    Minutes per session: Not on file  . Stress: Very much  Relationships  . Social connections:    Talks on phone: More than three times a week    Gets together: More than three times a week    Attends religious service: Never    Active member of club or organization: No    Attends meetings of clubs or organizations: Never    Relationship status: Never married  . Intimate partner violence:    Fear of current or ex partner: Patient refused    Emotionally abused: Patient refused    Physically abused: Patient refused    Forced sexual activity: Patient refused  Other Topics Concern  . Not on file  Social History Narrative   Unemployed:  cleaning in past   Living at [place]; has Medicaid.   crack/cocaine use; pt denies IVDU   tobacco:  1/2 ppd, trying to quit   alcohol:  none         Review of Systems    General:  No chills,  fever, night sweats or weight changes.  Cardiovascular:  +++ L sided and pleuritic chest pain, made worse with cough; +++ dyspnea on exertion, +++ edema of b/l AKA, no orthopnea, palpitations, paroxysmal nocturnal dyspnea. Dermatological: No rash, lesions/masses Respiratory: +++cough, ++++dyspnea Urologic: No hematuria, dysuria Abdominal:   +++nausea, no vomiting, diarrhea, bright red blood per rectum, melena, or hematemesis Neurologic:  No visual changes, +++wkns, changes in mental status. All other systems reviewed and are otherwise negative except as noted above.  Physical Exam    Blood pressure (!) 144/106, pulse 97, temperature 98 F (36.7 C), temperature source Oral, resp. rate (!) 24, weight 39.7 kg, SpO2 97 %.  General: Pleasant, unable to remain still d/t pain Psych: Normal affect. Neuro: Alert and oriented X 3. Moves upper extremities & b/l AKA spontaneously. HEENT: Normal  Neck: Supple without bruits. +JVD ~10cm to level of ear  Lungs:  Resp irregular and labored with decreased sound at R base. Heart: tachycardic, regular. no s3, s4. +systolic murmur (?TR). Abdomen: Firm, tender, non-distended. Extremities: No clubbing, cyanosis. No edema surround b/l AKA. Radials 2+ and equal bilaterally.  Labs    Troponin (Point of Care Test) No results for input(s): TROPIPOC in the last 72 hours. Recent Labs    05/19/18 2116 05/20/18 0315  TROPONINI 0.07* 0.05*   Lab Results  Component Value Date   WBC 5.9 05/19/2018   HGB 10.4 (L) 05/19/2018   HCT 36.2 05/19/2018   MCV 94.5 05/19/2018   PLT 216 05/19/2018    Recent Labs  Lab 05/19/18 2116  NA 135  K 3.4*  CL 106  CO2 19*  BUN 18  CREATININE 0.74  CALCIUM 8.8*  PROT 5.7*  BILITOT 1.7*  ALKPHOS 95  ALT 101*  AST 36  GLUCOSE 108*   Lab Results  Component Value Date   CHOL 185 04/21/2015   HDL 50 04/21/2015   LDLCALC 125 (H) 04/21/2015   TRIG 48 04/21/2015   Lab Results  Component Value Date   DDIMER 8.65  (H) 10/09/2017     Radiology Studies    Dg Chest 2 View  Result Date: 05/19/2018 CLINICAL  DATA:  Shortness of breath and left-sided chest pain for 2 days. EXAM: CHEST - 2 VIEW COMPARISON:  04/26/2018 FINDINGS: Enlarged cardiac silhouette with preferential enlargement of the left ventricle. Calcific atherosclerotic disease of the aorta. Mediastinal contours appear intact. Right pleural effusion with associated compressive atelectasis. Osseous structures are without acute abnormality. Soft tissues are grossly normal. IMPRESSION: Enlarged cardiac silhouette with preferential enlargement of the left chambers. Moderate in size right pleural effusion. Calcific atherosclerotic disease of the aorta. Electronically Signed   By: Fidela Salisbury M.D.   On: 05/19/2018 21:08   Dg Chest 2 View  Result Date: 04/26/2018 CLINICAL DATA:  Chest pain and cough for 2 weeks. EXAM: CHEST - 2 VIEW COMPARISON:  02/02/2018 FINDINGS: Mild cardiac enlargement. Shallow inspiration. Previous right lung infiltrates have resolved. There is a small right pleural effusion with mild basilar atelectasis. No pneumothorax. Calcification of the aorta. Mediastinal contours appear intact. IMPRESSION: Small right pleural effusion with basilar atelectasis. Resolution of previous right lung infiltrates. Electronically Signed   By: Lucienne Capers M.D.   On: 04/26/2018 01:52    ECG & Cardiac Imaging   06/05/2017  TTE Study Conclusions - Left ventricle: The cavity size was normal. Wall thickness was   increased in a pattern of mild LVH. Systolic function was normal.   The estimated ejection fraction was in the range of 55% to 60%.   Wall motion was normal; there were no regional wall motion   abnormalities. Doppler parameters are consistent with abnormal   left ventricular relaxation (grade 1 diastolic dysfunction). Impressions: Normal LV systolic function; mild diastolic dysfunction; mild   LVH; no significant pericardial  effusion.  Assessment & Plan    1. Chest pain in the setting of elevated blood pressure and troponin with h/o cocaine abuse - Current L sided pleuritic CP, rated 7/10 (at worst 10/10) with HPI as above. - Last cocaine use 1 week ago; tox screen positive for cocaine. Current cigarette smoker.  - Previous 06/2017 echo above with EF 55-60%, mild LVH, no pericardial effusion, and G1DD. - CXR shows right pleural effusion and enlarged cardiac silhouette as above. - EKG as above. Telemetry shows sinus tachy rates: 90s-low 100s. - Elevated troponin 0.07  0.05. Elevated BNP at 3603. - Will need further studies to rule in /out CHF with elevated BNP / JVD v ACS with elevated troponin v. ischemic demand in setting of elevated / uncontrolled BP/cocaine use and illness.  - Continue to cycle troponin. - Recommended labs: lipid panel, liver function tests, TSH, magnesium. - ASA 374m given in ED. Recommend continue daily ASA at 863m  - Allergy documented to acetaminophen and lisinopril.  - SL nitro given in ED. Recommend continue PRN nitro for CP. - Statin recommended, pending liver function tests. - ACEI not recommended given documented intolerance/allergy to lisinopril. Clarify intolerance to determine if ARB an acceptable alternative. - Caution / hold BB for now given in setting of h/o recent cocaine use. - Continue lovenox. Continue IV Lasix 4086mID.  Continue hydralazine IV 58m9m Scr: 02/2018, 0.55  04/2018, 0.71  05/2018, 0.74. Creatinine slightly elevated.  - Recommend echocardiogram today as patient has already eaten breakfast. Further ischemic workup or nuclear study pending results of today's echo. - Dr. HiltDebara Pickettsee patient with further recommendations pending.  2. Essential Hypertension - BP 190/112 in ED. Most recent 144/106. - Recommend hold home ibuprofen medication in the setting of HTN and the above. - Documented noncompliance with medications - not taking  home diuretic of HCTZ 64m.  -  Strict I/IO with daily weights. - Continue amlodipine 524mpo qd, lasix, and hydralazine as above  3. B/l AKAs d/t vasculitis - Patient resides with brother - Documented cocaine induced vasculitis - Patient reports ongoing edema and tenderness of amputations  4. Hypokalemia - K+ 3.4. KCl supplement ordered. Recommend replete with goal 4.0. - Check magnesium with goal 2.0. - Daily BMP to monitor electrolytes.  5. Normocytic Anemia - Stable.  - Hgb 10.4    For questions or updates, please contact [facility] consult [email or web address] for contact info under Cardiology/STEMI.      SiDorthula NettlesPA-C  Pager [phone]/16/2019, 8:19 AM

## 2018-05-20 NOTE — Progress Notes (Signed)
NP on call notified of pt c/o generalized pain & itching. Also of pt's bp being 160s-180s/1teens. Pt doesn't have anything ordered for any of these issues. Will continue to monitor the pt & await new orders.waiting for pharmacy to send scheduled nitrodur  Rease Swinson, Corrinne Eagle, RN

## 2018-05-20 NOTE — ED Notes (Signed)
Attempted report to [facility] to take due to active chest pain.  Paging the admitting physician at this time.

## 2018-05-21 DIAGNOSIS — I10 Essential (primary) hypertension: Secondary | ICD-10-CM

## 2018-05-21 DIAGNOSIS — I5031 Acute diastolic (congestive) heart failure: Secondary | ICD-10-CM

## 2018-05-21 DIAGNOSIS — E876 Hypokalemia: Secondary | ICD-10-CM

## 2018-05-21 LAB — COMPREHENSIVE METABOLIC PANEL
ALK PHOS: 98 U/L (ref 38–126)
ALT: 78 U/L — AB (ref 0–44)
ALT: 78 U/L — AB (ref 0–44)
AST: 30 U/L (ref 15–41)
AST: 35 U/L (ref 15–41)
Albumin: 2.9 g/dL — ABNORMAL LOW (ref 3.5–5.0)
Albumin: 3 g/dL — ABNORMAL LOW (ref 3.5–5.0)
Alkaline Phosphatase: 96 U/L (ref 38–126)
Anion gap: 14 (ref 5–15)
Anion gap: 12 (ref 5–15)
BILIRUBIN TOTAL: 2.1 mg/dL — AB (ref 0.3–1.2)
BUN: 15 mg/dL (ref 6–20)
BUN: 16 mg/dL (ref 6–20)
CALCIUM: 8.5 mg/dL — AB (ref 8.9–10.3)
CHLORIDE: 99 mmol/L (ref 98–111)
CO2: 26 mmol/L (ref 22–32)
CO2: 25 mmol/L (ref 22–32)
Calcium: 8.6 mg/dL — ABNORMAL LOW (ref 8.9–10.3)
Chloride: 98 mmol/L (ref 98–111)
Creatinine, Ser: 0.88 mg/dL (ref 0.44–1.00)
Creatinine, Ser: 0.82 mg/dL (ref 0.44–1.00)
GFR calc Af Amer: >60 mL/min (ref >60)
GFR calc non Af Amer: >60 mL/min (ref >60)
GLUCOSE: 131 mg/dL — AB (ref 70–99)
Glucose, Bld: 84 mg/dL (ref 70–99)
POTASSIUM: 2.9 mmol/L — AB (ref 3.5–5.1)
Potassium: 2.6 mmol/L — CL (ref 3.5–5.1)
SODIUM: 139 mmol/L (ref 135–145)
SODIUM: 135 mmol/L (ref 135–145)
Total Bilirubin: 2.1 mg/dL — ABNORMAL HIGH (ref 0.3–1.2)
Total Protein: 6.1 g/dL — ABNORMAL LOW (ref 6.5–8.1)
Total Protein: 6.1 g/dL — ABNORMAL LOW (ref 6.5–8.1)

## 2018-05-21 LAB — CBC
HEMATOCRIT: 38.7 % (ref 36.0–46.0)
HEMOGLOBIN: 12.1 g/dL (ref 12.0–15.0)
MCH: 27.1 pg (ref 26.0–34.0)
MCHC: 31.3 g/dL (ref 30.0–36.0)
MCV: 86.8 fL (ref 78.0–100.0)
Platelets: 214 10*3/uL (ref 150–400)
RBC: 4.46 MIL/uL (ref 3.87–5.11)
RDW: 15.8 % — ABNORMAL HIGH (ref 11.5–15.5)
WBC: 6 10*3/uL (ref 4.0–10.5)

## 2018-05-21 LAB — GLUCOSE, CAPILLARY
GLUCOSE-CAPILLARY: 89 mg/dL (ref 70–99)
GLUCOSE-CAPILLARY: 110 mg/dL — AB (ref 70–99)
Glucose-Capillary: 114 mg/dL — ABNORMAL HIGH (ref 70–99)
Glucose-Capillary: 113 mg/dL — ABNORMAL HIGH (ref 70–99)

## 2018-05-21 LAB — MAGNESIUM: MAGNESIUM: 1.6 mg/dL — AB (ref 1.7–2.4)

## 2018-05-21 MED ORDER — POTASSIUM CHLORIDE CRYS ER 20 MEQ PO TBCR
40.0000 meq | EXTENDED_RELEASE_TABLET | Freq: Three times a day (TID) | ORAL | Status: DC
  Administered 2018-05-21 (×3): 40 meq via ORAL
  Filled 2018-05-21 (×3): qty 2

## 2018-05-21 MED ORDER — HYDRALAZINE HCL 10 MG PO TABS
10.0000 mg | ORAL_TABLET | Freq: Three times a day (TID) | ORAL | Status: DC
  Administered 2018-05-21 – 2018-05-22 (×3): 10 mg via ORAL
  Filled 2018-05-21 (×3): qty 1

## 2018-05-21 MED ORDER — MAGNESIUM SULFATE 2 GM/50ML IV SOLN
2.0000 g | Freq: Once | INTRAVENOUS | Status: AC
  Administered 2018-05-21: 2 g via INTRAVENOUS
  Filled 2018-05-21: qty 50

## 2018-05-21 MED ORDER — GABAPENTIN 100 MG PO CAPS
200.0000 mg | ORAL_CAPSULE | Freq: Two times a day (BID) | ORAL | Status: DC
  Administered 2018-05-21 – 2018-05-22 (×2): 200 mg via ORAL
  Filled 2018-05-21 (×2): qty 2

## 2018-05-21 MED ORDER — ISOSORBIDE MONONITRATE ER 60 MG PO TB24
60.0000 mg | ORAL_TABLET | Freq: Every day | ORAL | Status: DC
  Administered 2018-05-21 – 2018-05-23 (×3): 60 mg via ORAL
  Filled 2018-05-21 (×3): qty 1

## 2018-05-21 MED ORDER — FUROSEMIDE 40 MG PO TABS
40.0000 mg | ORAL_TABLET | Freq: Two times a day (BID) | ORAL | Status: DC
  Administered 2018-05-21: 40 mg via ORAL
  Filled 2018-05-21: qty 1

## 2018-05-21 NOTE — Progress Notes (Signed)
Progress Note  Patient Name: [name] Date of Encounter: 05/21/2018  Primary Cardiologist: Dr. Debara Pickett  Subjective   Patient endorses improving chest pain today. Does think her breathing is improved, but notes that her amputation stumps are edematous. We discussed the results of her echo.   Inpatient Medications    Scheduled Meds: . amLODipine  10 mg Oral Daily  . furosemide  40 mg Intravenous BID  . heparin injection (subcutaneous)  5,000 Units Subcutaneous Q8H  . lactose free nutrition  237 mL Oral TID BM  . multivitamin with minerals  1 tablet Oral Daily  . nicotine  7 mg Transdermal Daily  . nitroGLYCERIN  0.4 mg Transdermal Daily   Continuous Infusions:  PRN Meds: albuterol, bisacodyl, dextromethorphan-guaiFENesin, ibuprofen, ondansetron **OR** ondansetron (ZOFRAN) IV, traMADol   Vital Signs    Vitals:   05/21/18 0018 05/21/18 0514 05/21/18 0529 05/21/18 0727  BP: (!) 156/99 (!) 149/109  (!) 165/104  Pulse: 92 97  90  Resp:      Temp: 97.8 F (36.6 C) (!) 97.5 F (36.4 C)  98.3 F (36.8 C)  TempSrc: Oral Axillary  Oral  SpO2: 99% 99%  97%  Weight:   34.5 kg   Height:        Intake/Output Summary (Last 24 hours) at 05/21/2018 0939 Last data filed at 05/21/2018 0530 Gross per 24 hour  Intake 477 ml  Output 4050 ml  Net -3573 ml   Filed Weights   05/20/18 0155 05/20/18 0534 05/21/18 0529  Weight: 39.7 kg 39.7 kg 34.5 kg    Telemetry    NSR - Personally Reviewed  ECG    NSR - Personally Reviewed  Physical Exam   GEN: No acute distress.   Neck: supple, JVD at clavicle at 60 degrees Cardiac: regular S1 and S2, 3/6 holosystolic murmur at RSB, no rubs or gallops.  Respiratory: Clear to auscultation bilaterally. GI: Soft, nontender, non-distended. Bowel sounds normal MS: bilateral AKA with tenderness and mild edema at stump sites Neuro:  Nonfocal, moves all limbs independently Psych: Normal affect   Labs    Chemistry Recent Labs  Lab  05/19/18 2116 05/20/18 0836 05/21/18 0507  NA 135 136 139  K 3.4* 3.3* 2.9*  CL 106 105 99  CO2 19* 20* 26  GLUCOSE 108* 176* 84  BUN 18 15 15   CREATININE 0.74 0.74 0.88  CALCIUM 8.8* 8.7* 8.6*  PROT 5.7*  --  6.1*  ALBUMIN 3.0*  --  2.9*  AST 36  --  30  ALT 101*  --  78*  ALKPHOS 95  --  96  BILITOT 1.7*  --  2.1*  GFRNONAA >60 >60 >60  GFRAA >60 >60 >60  ANIONGAP 10 11 14      Hematology Recent Labs  Lab 05/19/18 2116  WBC 5.9  RBC 3.83*  HGB 10.4*  HCT 36.2  MCV 94.5  MCH 27.2  MCHC 28.7*  RDW 16.2*  PLT 216    Cardiac Enzymes Recent Labs  Lab 05/19/18 2116 05/20/18 0315 05/20/18 0836  TROPONINI 0.07* 0.05* 0.04*   No results for input(s): TROPIPOC in the last 168 hours.   BNP Recent Labs  Lab 05/19/18 2116  BNP 3,603.1*     DDimer No results for input(s): DDIMER in the last 168 hours.   Radiology    Dg Chest 2 View  Result Date: 05/19/2018 CLINICAL DATA:  Shortness of breath and left-sided chest pain for 2 days. EXAM: CHEST - 2  VIEW COMPARISON:  04/26/2018 FINDINGS: Enlarged cardiac silhouette with preferential enlargement of the left ventricle. Calcific atherosclerotic disease of the aorta. Mediastinal contours appear intact. Right pleural effusion with associated compressive atelectasis. Osseous structures are without acute abnormality. Soft tissues are grossly normal. IMPRESSION: Enlarged cardiac silhouette with preferential enlargement of the left chambers. Moderate in size right pleural effusion. Calcific atherosclerotic disease of the aorta. Electronically Signed   By: Fidela Salisbury M.D.   On: 05/19/2018 21:08    Cardiac Studies   Echo 05/20/18 Left ventricle: The cavity size was normal. Wall thickness was   increased in a pattern of mild LVH. Systolic function was normal.   The estimated ejection fraction was in the range of 60% to 65%.   Wall motion was normal; there were no regional wall motion   abnormalities. Features are  consistent with a pseudonormal left   ventricular filling pattern, with concomitant abnormal relaxation   and increased filling pressure (grade 2 diastolic dysfunction). - Ventricular septum: Mildly D-shaped interventricular septum   suggesting RV pressure/volume overload. - Aortic valve: There was no stenosis. - Mitral valve: There is restriction of the posterior leaflet.   There was moderate to severe regurgitation. - Left atrium: The atrium was moderately to severely dilated. - Right ventricle: The cavity size was normal. Systolic function   was mildly reduced. - Right atrium: The atrium was moderately dilated. - Tricuspid valve: Peak RV-RA gradient (S): 53 mm Hg. - Pulmonary arteries: PA peak pressure: 56 mm Hg (S). - Inferior vena cava: The vessel was normal in size. The   respirophasic diameter changes were in the normal range (>= 50%),   consistent with normal central venous pressure. - Pericardium, extracardiac: A trivial pericardial effusion was   identified.  Impressions:  - Normal LV size with mild LV hypertrophy. EF 60-65%. Moderate   diastolic dysfunction. Thickened mitral valve with restricted   posterior leaflet, moderate to possibly severe mitral   regurgitation. Would consider TEE to investigate more closely.   Mildly D-shaped interventricular septum suggesting RV   pressure/volume overload. Normal RV size with mildly decreased   systolic function. Moderate pulmonary hypertension.  Patient Profile     56 y.o. female with PMH hypertension, cocaine abuse, tobacco abuse, history of vasculitis s/p bilateral AKA who was seen at the request of Dr. Thereasa Solo for chest pain and elevated troponins.   Assessment & Plan    1. Chest pain in the setting of elevated blood pressure and troponin with h/o cocaine abuse - now chest pain resolved. Echo with normal wall motion and EF. Do not feel that we need to pursue inpatient workup for her pain with a normal EF and no wall  motion abnormalities.  - Last cocaine use 1 week ago; tox screen positive for cocaine. Current cigarette smoker.  - continue daily ASA at 18m.  - will change nitro patch to imdur - LFTs mildly elevated. If they stabilize would start statin - ACEI not recommended given documented intolerance/allergy to lisinopril. Clarify intolerance to determine if ARB an acceptable alternative. - Hold BB for now given in setting of h/o recent cocaine use.  2. Essential Hypertension - continue amlodipine 10 mg daily - receiving lasix BID currently. Will change to oral today. Cr 0.74 on presentation, 0.82 today - would benefit from afterload reduction given her MR and high blood pressure. Unlikely that she will be able to take TID medication at home (such as hydralazine), but it may benefit her while in the hospital.  Will trial 10 mg TID hydralazine. Starting imdur today as above. - allergic to ACEI, unclear what the reaction was and if she could tolerate ARB - can consider starting spironolactone as well given her hypokalemia.  3. B/l AKAs d/t vasculitis - Patient resides with brother - Documented cocaine induced vasculitis - Patient reports ongoing edema and tenderness of amputations  4. Hypokalemia - Potassium continues to be low, 2.6 today. Will need aggressive repletion while receiving diuretics. Recommend replete with goal 4.0. - Check magnesium with goal 2.0. - Daily BMP to monitor electrolytes.  5. Severe MR on echo. Patient with MR and elevated filling pressures. She would benefit from afterload reduction. It is unclear whether she is symptomatic from her MR given her comorbidities. Can consider TEE in the future to further evaluate if her volume status or breathing becomes worse, but she is not active at baseline, so it is not limiting her activity. If she is able to follow up as outpatient, we can discuss TEE and further workup at that time.    Time Spent Directly with Patient: I have spent  a total of 25 minutes with the patient reviewing hospital notes, telemetry, EKGs, labs and examining the patient as well as establishing an assessment and plan that was discussed personally with the patient.  > 50% of time was spent in direct patient care.  Length of Stay:  LOS: 1 day   Buford Dresser, MD, PhD [facility]  [facility]   05/21/2018, 9:39 AM      For questions or updates, please contact [place] Please consult [email or web address] for contact info under Cardiology/STEMI.

## 2018-05-21 NOTE — Progress Notes (Signed)
[place] TEAM 1 - Stepdown/ICU TEAM  [name]  JME:[id number] DOB: 1961-04-27 DOA: 05/19/2018 PCP: Patient, No Pcp Per    Brief Narrative:  57 y.o. female with a hx of HTN, ongoing cocaine abuse, depression, and bilateral AKA's who presented w/ shortness of breath and chest pain for 3 days.  In the ED she was found to have a BP of 190/112, tachypneic to 40, but O2 sats greater than 95% on RA. CXR noted a moderate sized right pleural effusion. BNP 3603. EKG-Q waves otherwise nonspecific T wave changes.    Subjective: The patient has no new complaints today but reports that she "just doesn't feel good in general."  She denies current chest pain shortness breath nausea vomiting or abdominal pain.  She does report some swelling in her lower extremity stops.  Assessment & Plan:  Chest pain Cards evaluated - no WMA on TTE - troponin negative x3 - no further inpt testing indicated for this problem - cont ASA 78m QD - placing on Imdur - consider statin if LFTs normalize   Dyspnea  sats stable on RA w/ no further dyspnea at this time   Uncontrolled HTN BP remains variable - hydralazine and imdur added by Cards today - consider aldacdtone if BP remains elevated   Severe Hypokalemia Likely very poor nutritional status in the setting of ongoing substance abuse - substitute aggressively and follow up in a.m.  Hypomagnesemia  Likely due to very poor nutritional status in the setting of ongoing substance abuse - supplement via IV route and follow-up in a.m.  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 rate (!) 24, height 5' 1"  (1.549 m), weight 34.5 kg, SpO2 98 %.  Intake/Output Summary (Last 24 hours) at 05/21/2018 1539 Last data filed at 05/21/2018 0530 Gross per 24 hour  Intake 237 ml  Output 1300 ml  Net -1063 ml   Filed Weights   05/20/18 0155 05/20/18 0534 05/21/18 0529  Weight: 39.7 kg 39.7 kg 34.5 kg    Examination: General: No acute respiratory distress - alert and conversant  Lungs: CTA B - no wheezing  Cardiovascular: RRR - 3/6 holosystolic M  Abdomen: NT/ND, soft, bs+, no mass Extremities: 1+ edema B AKA stumps   CBC: Recent Labs  Lab 05/19/18 2116 05/21/18 1027  WBC 5.9 6.0  NEUTROABS 3.3  --   HGB 10.4* 12.1  HCT 36.2 38.7  MCV 94.5 86.8  PLT 216 2229  Basic Metabolic Panel: Recent Labs  Lab 05/20/18 0836 05/21/18 0507 05/21/18 1027  NA 136 139 135  K 3.3* 2.9* 2.6*  CL 105 99 98  CO2 20* 26 25  GLUCOSE 176* 84 131*  BUN 15 15 16   CREATININE 0.74 0.88 0.82  CALCIUM 8.7* 8.6* 8.5*  MG  --  1.6*  --    GFR: Estimated Creatinine  Clearance: 42.2 mL/min (by C-G formula based on SCr of 0.82 mg/dL).  Liver Function Tests: Recent Labs  Lab 05/19/18 2116 05/21/18 0507 05/21/18 1027  AST 36 30 35  ALT 101* 78* 78*  ALKPHOS 95 96 98  BILITOT 1.7* 2.1* 2.1*  PROT 5.7* 6.1* 6.1*  ALBUMIN 3.0* 2.9* 3.0*   Recent Labs  Lab 05/19/18 2116  LIPASE 23    Cardiac Enzymes: Recent Labs  Lab 05/19/18 2116 05/20/18 0315 05/20/18 0836  TROPONINI 0.07* 0.05* 0.04*    HbA1C: Hgb A1c MFr Bld  Date/Time Value Ref Range Status  03/30/2017 07:44 PM 5.5 4.8 - 5.6 % Final    Comment:    (NOTE)         Pre-diabetes: 5.7 - 6.4         Diabetes: >6.4         Glycemic control for adults with diabetes: <7.0   04/21/2015 04:08 PM 5.1 4.8  - 5.6 % Final    Comment:    (NOTE)         Pre-diabetes: 5.7 - 6.4         Diabetes: >6.4         Glycemic control for adults with diabetes: <7.0      Recent Results (from the past 240 hour(s))  MRSA PCR Screening     Status: None   Collection Time: 05/20/18  1:48 AM  Result Value Ref Range Status   MRSA by PCR NEGATIVE NEGATIVE Final    Comment:        The GeneXpert MRSA Assay (FDA approved for NASAL specimens only), is one component of a comprehensive MRSA colonization surveillance program. It is not intended to diagnose MRSA infection nor to guide or monitor treatment for MRSA infections. Performed at [facility], [place] [address] [place], [place] [address]      Scheduled Meds: . amLODipine  10 mg Oral Daily  . furosemide  40 mg Oral BID  . heparin injection (subcutaneous)  5,000 Units Subcutaneous Q8H  . hydrALAZINE  10 mg Oral Q8H  . isosorbide mononitrate  60 mg Oral Daily  . lactose free nutrition  237 mL Oral TID BM  . multivitamin with minerals  1 tablet Oral Daily  . nicotine  7 mg Transdermal Daily  . potassium chloride  40 mEq Oral TID    LOS: 1 day   Cherene Altes, MD [facility] Office  [phone] Pager - Text Page per Amion  If 7PM-7AM, please contact night-coverage per Amion 05/21/2018, 3:39 PM

## 2018-05-21 NOTE — Progress Notes (Signed)
CRITICAL VALUE ALERT  Critical Value:  K at 2.9  Date & Time Notied:  05/21/18 at 0646  Provider Notified: MD on call ([facility])  Orders Received/Actions taken: pending.Marland KitchenMarland Kitchen

## 2018-05-22 LAB — CBC
HEMATOCRIT: 35.5 % — AB (ref 36.0–46.0)
Hemoglobin: 11 g/dL — ABNORMAL LOW (ref 12.0–15.0)
MCH: 27.3 pg (ref 26.0–34.0)
MCHC: 31 g/dL (ref 30.0–36.0)
MCV: 88.1 fL (ref 78.0–100.0)
Platelets: 189 10*3/uL (ref 150–400)
RBC: 4.03 MIL/uL (ref 3.87–5.11)
RDW: 15.6 % — ABNORMAL HIGH (ref 11.5–15.5)
WBC: 5.2 10*3/uL (ref 4.0–10.5)

## 2018-05-22 LAB — BASIC METABOLIC PANEL
Anion gap: 6 (ref 5–15)
BUN: 22 mg/dL — ABNORMAL HIGH (ref 6–20)
CHLORIDE: 104 mmol/L (ref 98–111)
CO2: 28 mmol/L (ref 22–32)
CREATININE: 0.85 mg/dL (ref 0.44–1.00)
Calcium: 9.4 mg/dL (ref 8.9–10.3)
GFR calc non Af Amer: >60 mL/min (ref >60)
Glucose, Bld: 78 mg/dL (ref 70–99)
Potassium: 5.4 mmol/L — ABNORMAL HIGH (ref 3.5–5.1)
SODIUM: 138 mmol/L (ref 135–145)

## 2018-05-22 LAB — GLUCOSE, CAPILLARY
GLUCOSE-CAPILLARY: 73 mg/dL (ref 70–99)
GLUCOSE-CAPILLARY: 108 mg/dL — AB (ref 70–99)
GLUCOSE-CAPILLARY: 126 mg/dL — AB (ref 70–99)
Glucose-Capillary: 138 mg/dL — ABNORMAL HIGH (ref 70–99)

## 2018-05-22 LAB — MAGNESIUM: MAGNESIUM: 2 mg/dL (ref 1.7–2.4)

## 2018-05-22 MED ORDER — DIPHENHYDRAMINE HCL 25 MG PO CAPS
25.0000 mg | ORAL_CAPSULE | Freq: Four times a day (QID) | ORAL | Status: DC | PRN
  Administered 2018-05-22 – 2018-05-23 (×4): 25 mg via ORAL
  Filled 2018-05-22 (×4): qty 1

## 2018-05-22 MED ORDER — POTASSIUM CHLORIDE CRYS ER 20 MEQ PO TBCR: 40.0000 meq | EXTENDED_RELEASE_TABLET | Freq: Every day | ORAL | Status: DC

## 2018-05-22 MED ORDER — FUROSEMIDE 40 MG PO TABS
40.0000 mg | ORAL_TABLET | Freq: Every day | ORAL | Status: DC
  Administered 2018-05-22 – 2018-05-23 (×2): 40 mg via ORAL
  Filled 2018-05-22 (×2): qty 1

## 2018-05-22 NOTE — Plan of Care (Signed)
  Problem: Pain Managment: Goal: General experience of comfort will improve Outcome: Progressing   Problem: Safety: Goal: Ability to remain free from injury will improve Outcome: Progressing   

## 2018-05-22 NOTE — Progress Notes (Signed)
PROGRESS NOTE    [name]  [id number]:[id number] DOB: 1961/07/27 DOA: 05/19/2018 PCP: Patient, No Pcp Per      Brief Narrative:  [name] is a 57 y.o. 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  In the ER she was found to have blood pressure 190/112, tachypnea to 40/min, but normal O2 sat on room air.  Chest x-ray showed moderate-sized right pleural effusion.  BNP 3600.  EKG with Q waves, otherwise nonspecific T wave changes.  She was admitted for chest pain with hypertensive urgency.   Assessment & Plan:  Chest pain Echocardiogram shows no wall motion abnormalities, and serial troponins were negative.  Cardiology evaluated the patient and recommended no further inpatient testing. -Continue aspirin -Imdur started -Consider statin if LFTs normalize  Hives This is new this morning.  She has itchy edematous welts scattered over her chest arms and legstumps.  New medicines started this admission include hydralazine and Imdur.  She is also had numerous PRN's. -Discontinue all PRN medicines -Hydralazine was discontinued by cardiology -Low threshold to stop Imdur -May continue Benadryl for hives  Transaminitis Resolving, likely cocaine induced injury. -Follow-up AST and ALT as an outpatient  Hypertensive urgency From cocaine.  Has improved. -Continue amlodipine -Hydralazine has been stopped due to hives -Continue Imdur  Hypokalemia Hypokalemia has returned to hyperkalemia -Repeat BMP tomorrow  Hypomagnesemia Magnesium replete      DVT prophylaxis: Heparin Code Status: Full code Family Communication: None present MDM and disposition Plan: The below labs and imaging reports were reviewed and summarized above.    The patient was admitted with hypertensive urgency and chest pain.  She is been evaluated by cardiology, recommend no further inpatient work-up.  She has now developed  hypokalemia and hives.    Consultants:   Cardiology  Procedures:   Echocardiogram  Antimicrobials:   None   Subjective: No chest pain, dyspnea, malaise, confusion.  No fever.  She has hives all of her body.  No tongue swelling, respiratory distress, abdominal pain, nausea, vomiting.  Objective: Vitals:   05/22/18 0411 05/22/18 0415 05/22/18 0421 05/22/18 1415  BP: 109/84   127/82  Pulse: 84   96  Resp:      Temp:  (!) 94.5 F (34.7 C) (!) 97.5 F (36.4 C) 98.4 F (36.9 C)  TempSrc:  Axillary Oral Oral  SpO2: 100%   97%  Weight:      Height:        Intake/Output Summary (Last 24 hours) at 05/22/2018 1733 Last data filed at 05/22/2018 1300 Gross per 24 hour  Intake 840 ml  Output 2450 ml  Net -1610 ml   Filed Weights   05/20/18 0155 05/20/18 0534 05/21/18 0529  Weight: 39.7 kg 39.7 kg 34.5 kg    Examination: General appearance: Thin adult female, lying in bed.  Interactive.    HEENT: Anicteric, conjunctiva pink, lids and lashes normal. No nasal deformity, discharge, epistaxis.  Lips moist, edentulous.  Oropharynx moist, no oral lesions, tongue normal, hearing normal.   Skin: Warm and dry.  There are edematous raised plaques, of varying sizes, oval-shaped on the trunk, arms, and legs times.  Some of these have been excoriated, some of them have small blisters. Cardiac: RRR, nl S1-S2, I do not appreciate her murmur.  Capillary refill is brisk.  JVP elevated.  No LE edema.  Radia  pulses 2+ and symmetric. Respiratory: Normal respiratory rate and  rhythm.  CTAB without rales or wheezes. Abdomen: Abdomen soft.  no TTP, or guarding. No ascites, distension, hepatosplenomegaly.   MSK: No deformities or effusions.  Bilateral AKA Neuro: Awake and alert.  EOMI, moves all extremities. Speech fluent.    Psych: Sensorium intact and responding to questions, attention normal. Affect normal.  Judgment and insight appear normal.    Data Reviewed: I have personally reviewed  following labs and imaging studies:  CBC: Recent Labs  Lab 05/19/18 2116 05/21/18 1027 05/22/18 0351  WBC 5.9 6.0 5.2  NEUTROABS 3.3  --   --   HGB 10.4* 12.1 11.0*  HCT 36.2 38.7 35.5*  MCV 94.5 86.8 88.1  PLT 216 214 349   Basic Metabolic Panel: Recent Labs  Lab 05/19/18 2116 05/20/18 0836 05/21/18 0507 05/21/18 1027 05/22/18 0351  NA 135 136 139 135 138  K 3.4* 3.3* 2.9* 2.6* 5.4*  CL 106 105 99 98 104  CO2 19* 20* 26 25 28   GLUCOSE 108* 176* 84 131* 78  BUN 18 15 15 16  22*  CREATININE 0.74 0.74 0.88 0.82 0.85  CALCIUM 8.8* 8.7* 8.6* 8.5* 9.4  MG  --   --  1.6*  --  2.0   GFR: Estimated Creatinine Clearance: 40.7 mL/min (by C-G formula based on SCr of 0.85 mg/dL). Liver Function Tests: Recent Labs  Lab 05/19/18 2116 05/21/18 0507 05/21/18 1027  AST 36 30 35  ALT 101* 78* 78*  ALKPHOS 95 96 98  BILITOT 1.7* 2.1* 2.1*  PROT 5.7* 6.1* 6.1*  ALBUMIN 3.0* 2.9* 3.0*   Recent Labs  Lab 05/19/18 2116  LIPASE 23   No results for input(s): AMMONIA in the last 168 hours. Coagulation Profile: No results for input(s): INR, PROTIME in the last 168 hours. Cardiac Enzymes: Recent Labs  Lab 05/19/18 2116 05/20/18 0315 05/20/18 0836  TROPONINI 0.07* 0.05* 0.04*   BNP (last 3 results) No results for input(s): PROBNP in the last 8760 hours. HbA1C: No results for input(s): HGBA1C in the last 72 hours. CBG: Recent Labs  Lab 05/21/18 1625 05/21/18 2106 05/22/18 0719 05/22/18 1105 05/22/18 1716  GLUCAP 113* 110* 73 138* 108*   Lipid Profile: Recent Labs    05/20/18 0836  CHOL 164  HDL 40*  LDLCALC 116*  TRIG 40  CHOLHDL 4.1   Thyroid Function Tests: Recent Labs    05/20/18 1308  TSH 1.930   Anemia Panel: No results for input(s): VITAMINB12, FOLATE, FERRITIN, TIBC, IRON, RETICCTPCT in the last 72 hours. Urine analysis:    Component Value Date/Time   COLORURINE AMBER (A) 01/20/2018 1020   APPEARANCEUR HAZY (A) 01/20/2018 1020   LABSPEC  1.026 01/20/2018 1020   PHURINE 5.0 01/20/2018 1020   GLUCOSEU NEGATIVE 01/20/2018 1020   HGBUR LARGE (A) 01/20/2018 1020   BILIRUBINUR NEGATIVE 01/20/2018 1020   BILIRUBINUR small 04/29/2015 1533   KETONESUR NEGATIVE 01/20/2018 1020   PROTEINUR >=300 (A) 01/20/2018 1020   UROBILINOGEN 1.0 07/26/2017 1550   NITRITE NEGATIVE 01/20/2018 1020   LEUKOCYTESUR NEGATIVE 01/20/2018 1020   Sepsis Labs: @LABRCNTIP (procalcitonin:4,lacticacidven:4)  ) Recent Results (from the past 240 hour(s))  MRSA PCR Screening     Status: None   Collection Time: 05/20/18  1:48 AM  Result Value Ref Range Status   MRSA by PCR NEGATIVE NEGATIVE Final    Comment:        The GeneXpert MRSA Assay (FDA approved for NASAL specimens only), is one component of a comprehensive MRSA colonization surveillance program. It  is not intended to diagnose MRSA infection nor to guide or monitor treatment for MRSA infections. Performed at [facility], [place] [address] [place], [place] [address]          Radiology Studies: No results found.      Scheduled Meds: . amLODipine  10 mg Oral Daily  . furosemide  40 mg Oral Daily  . heparin injection (subcutaneous)  5,000 Units Subcutaneous Q8H  . isosorbide mononitrate  60 mg Oral Daily  . nicotine  7 mg Transdermal Daily   Continuous Infusions:   LOS: 2 days    Time spent: 25 minutes    Edwin Dada, MD [facility] 05/22/2018, 5:33 PM     Pager [phone] --- please page though AMION:  [email or web address] Password [email or web address] If 7PM-7AM, please contact night-coverage

## 2018-05-22 NOTE — Progress Notes (Signed)
Progress Note  Patient Name: [name] Date of Encounter: 05/22/2018  Primary Cardiologist: Dr. Debara Pickett  Subjective   No chest pain, swelling present and gradually improving. She is concerned as she has several areas of pruritic discoloration on her arms and legs.  Inpatient Medications    Scheduled Meds: . amLODipine  10 mg Oral Daily  . furosemide  40 mg Oral Daily  . gabapentin  200 mg Oral BID  . heparin injection (subcutaneous)  5,000 Units Subcutaneous Q8H  . isosorbide mononitrate  60 mg Oral Daily  . lactose free nutrition  237 mL Oral TID BM  . multivitamin with minerals  1 tablet Oral Daily  . nicotine  7 mg Transdermal Daily   Continuous Infusions:  PRN Meds: albuterol, bisacodyl, dextromethorphan-guaiFENesin, ibuprofen, ondansetron **OR** ondansetron (ZOFRAN) IV, traMADol   Vital Signs    Vitals:   05/21/18 2108 05/22/18 0411 05/22/18 0415 05/22/18 0421  BP: 122/82 109/84    Pulse: 90 84    Resp:      Temp: 97.6 F (36.4 C)  (!) 94.5 F (34.7 C) (!) 97.5 F (36.4 C)  TempSrc: Oral  Axillary Oral  SpO2: 100% 100%    Weight:      Height:        Intake/Output Summary (Last 24 hours) at 05/22/2018 0908 Last data filed at 05/22/2018 0416 Gross per 24 hour  Intake 480 ml  Output 1350 ml  Net -870 ml   Filed Weights   05/20/18 0155 05/20/18 0534 05/21/18 0529  Weight: 39.7 kg 39.7 kg 34.5 kg    Telemetry    NSR - Personally Reviewed  ECG    NSR - Personally Reviewed  Physical Exam   GEN: No acute distress.   Neck: supple, JVD at low neck at 60 degrees Cardiac: regular S1 and S2, 3/6 holosystolic murmur at RSB, no rubs or gallops.  Respiratory: Clear to auscultation bilaterally. GI: Soft, nontender, non-distended. Bowel sounds normal MS: bilateral AKA with tenderness and mild edema at stump sites Neuro:  Nonfocal, moves all limbs independently Psych: Normal affect  Skin: several areas of raised, circular, darkened discoloration on her  arms, stumps. Do not blanch.  Labs    Chemistry Recent Labs  Lab 05/19/18 2116  05/21/18 0507 05/21/18 1027 05/22/18 0351  NA 135   < > 139 135 138  K 3.4*   < > 2.9* 2.6* 5.4*  CL 106   < > 99 98 104  CO2 19*   < > 26 25 28   GLUCOSE 108*   < > 84 131* 78  BUN 18   < > 15 16 22*  CREATININE 0.74   < > 0.88 0.82 0.85  CALCIUM 8.8*   < > 8.6* 8.5* 9.4  PROT 5.7*  --  6.1* 6.1*  --   ALBUMIN 3.0*  --  2.9* 3.0*  --   AST 36  --  30 35  --   ALT 101*  --  78* 78*  --   ALKPHOS 95  --  96 98  --   BILITOT 1.7*  --  2.1* 2.1*  --   GFRNONAA >60   < > >60 >60 >60  GFRAA >60   < > >60 >60 >60  ANIONGAP 10   < > 14 12 6    < > = values in this interval not displayed.     Hematology Recent Labs  Lab 05/19/18 2116 05/21/18 1027 05/22/18 0351  WBC  5.9 6.0 5.2  RBC 3.83* 4.46 4.03  HGB 10.4* 12.1 11.0*  HCT 36.2 38.7 35.5*  MCV 94.5 86.8 88.1  MCH 27.2 27.1 27.3  MCHC 28.7* 31.3 31.0  RDW 16.2* 15.8* 15.6*  PLT 216 214 189    Cardiac Enzymes Recent Labs  Lab 05/19/18 2116 05/20/18 0315 05/20/18 0836  TROPONINI 0.07* 0.05* 0.04*   No results for input(s): TROPIPOC in the last 168 hours.   BNP Recent Labs  Lab 05/19/18 2116  BNP 3,603.1*     DDimer No results for input(s): DDIMER in the last 168 hours.   Radiology    No results found.  Cardiac Studies   Echo 05/20/18 Left ventricle: The cavity size was normal. Wall thickness was   increased in a pattern of mild LVH. Systolic function was normal.   The estimated ejection fraction was in the range of 60% to 65%.   Wall motion was normal; there were no regional wall motion   abnormalities. Features are consistent with a pseudonormal left   ventricular filling pattern, with concomitant abnormal relaxation   and increased filling pressure (grade 2 diastolic dysfunction). - Ventricular septum: Mildly D-shaped interventricular septum   suggesting RV pressure/volume overload. - Aortic valve: There was no  stenosis. - Mitral valve: There is restriction of the posterior leaflet.   There was moderate to severe regurgitation. - Left atrium: The atrium was moderately to severely dilated. - Right ventricle: The cavity size was normal. Systolic function   was mildly reduced. - Right atrium: The atrium was moderately dilated. - Tricuspid valve: Peak RV-RA gradient (S): 53 mm Hg. - Pulmonary arteries: PA peak pressure: 56 mm Hg (S). - Inferior vena cava: The vessel was normal in size. The   respirophasic diameter changes were in the normal range (>= 50%),   consistent with normal central venous pressure. - Pericardium, extracardiac: A trivial pericardial effusion was   identified.  Impressions:  - Normal LV size with mild LV hypertrophy. EF 60-65%. Moderate   diastolic dysfunction. Thickened mitral valve with restricted   posterior leaflet, moderate to possibly severe mitral   regurgitation. Would consider TEE to investigate more closely.   Mildly D-shaped interventricular septum suggesting RV   pressure/volume overload. Normal RV size with mildly decreased   systolic function. Moderate pulmonary hypertension.  Patient Profile     57 y.o. female with PMH hypertension, cocaine abuse, tobacco abuse, history of vasculitis s/p bilateral AKA who was seen at the request of Dr. Thereasa Solo for chest pain and elevated troponins.   Assessment & Plan    1. Chest pain in the setting of elevated blood pressure and troponin with h/o cocaine abuse - now chest pain resolved. Echo with normal wall motion and EF. Do not feel that we need to pursue inpatient workup for her pain with a normal EF and no wall motion abnormalities.  - Last cocaine use 1 week ago; tox screen positive for cocaine. Current cigarette smoker.  - continue daily ASA at 74m.  - continue imdur - LFTs mildly elevated. If they remain stable would start statin prior to discharge. - Hold BB for now given in setting of h/o recent cocaine  use.  2. Essential Hypertension - continue amlodipine 10 mg daily - change to once daily oral furosemide. This may be her home dosing if she does well on it. - her blood pressure has responded well to medication changes, but she has itchy rash today. Not completely consistent with hives, no throat  swelling or airway impingement. Will stop hydralazine and given benadryl to see if this improves. - allergic to ACEI, unclear what the reaction was and if she could tolerate ARB - K elevated from repletion, would hold on oral supplements and not start spironolactone today  3. B/l AKAs d/t vasculitis - Patient resides with brother - Documented cocaine induced vasculitis - Patient reports ongoing edema and tenderness of amputations -if rash does not improve, might need to consider derm or rheum workup to determine if it could be a vasculitic rash  4. Hypokalemia, now mildly hyperkalemic with repletion - Check magnesium with goal 2.0. - Daily BMP to monitor electrolytes.  5. Severe MR on echo. Patient with MR and elevated filling pressures. She would benefit from afterload reduction (attempted with hydralazine/nitrate, pending rash monitoring hydralazine is on hold). It is unclear whether she is symptomatic from her MR given her comorbidities. Can consider TEE in the future to further evaluate if her volume status or breathing becomes worse, but she is not active at baseline, so it is not limiting her activity. If she is able to follow up as outpatient, we can discuss TEE and further workup at that time.    Time Spent Directly with Patient: I have spent a total of 25 minutes with the patient reviewing hospital notes, telemetry, EKGs, labs and examining the patient as well as establishing an assessment and plan that was discussed personally with the patient.  > 50% of time was spent in direct patient care.  Length of Stay:  LOS: 2 days   Buford Dresser, MD, PhD [facility]  [facility]  [facility]   05/22/2018, 9:08 AM      For questions or updates, please contact [place] Please consult [email or web address] for contact info under Cardiology/STEMI.

## 2018-05-23 DIAGNOSIS — F332 Major depressive disorder, recurrent severe without psychotic features: Secondary | ICD-10-CM

## 2018-05-23 DIAGNOSIS — E440 Moderate protein-calorie malnutrition: Secondary | ICD-10-CM

## 2018-05-23 DIAGNOSIS — R238 Other skin changes: Secondary | ICD-10-CM

## 2018-05-23 LAB — BASIC METABOLIC PANEL
ANION GAP: 8 (ref 5–15)
BUN: 21 mg/dL — AB (ref 6–20)
CHLORIDE: 101 mmol/L (ref 98–111)
CO2: 25 mmol/L (ref 22–32)
Calcium: 9.1 mg/dL (ref 8.9–10.3)
Creatinine, Ser: 0.64 mg/dL (ref 0.44–1.00)
GFR calc Af Amer: >60 mL/min (ref >60)
GFR calc non Af Amer: >60 mL/min (ref >60)
Glucose, Bld: 83 mg/dL (ref 70–99)
POTASSIUM: 4.9 mmol/L (ref 3.5–5.1)
SODIUM: 134 mmol/L — AB (ref 135–145)

## 2018-05-23 LAB — GLUCOSE, CAPILLARY
GLUCOSE-CAPILLARY: 106 mg/dL — AB (ref 70–99)
GLUCOSE-CAPILLARY: 121 mg/dL — AB (ref 70–99)

## 2018-05-23 MED ORDER — POTASSIUM CHLORIDE ER 10 MEQ PO TBCR: 10.0000 meq | EXTENDED_RELEASE_TABLET | Freq: Every day | ORAL | 3 refills | Status: DC

## 2018-05-23 MED ORDER — NICOTINE 7 MG/24HR TD PT24: 7.0000 mg | MEDICATED_PATCH | Freq: Every day | TRANSDERMAL | 0 refills | Status: DC

## 2018-05-23 MED ORDER — AMLODIPINE BESYLATE 10 MG PO TABS: 10.0000 mg | ORAL_TABLET | Freq: Every day | ORAL | 3 refills | Status: DC

## 2018-05-23 MED ORDER — FUROSEMIDE 40 MG PO TABS: 40.0000 mg | ORAL_TABLET | Freq: Every day | ORAL | 3 refills | Status: DC

## 2018-05-23 NOTE — Progress Notes (Addendum)
Progress Note  Patient Name: [name] Date of Encounter: 05/23/2018  Primary Cardiologist: Dr. Debara Pickett  Subjective   Pt denies chest pain or palpitations. Mostly concerned with diffuse rash/whelps which began yesterday morning. See plan below   Inpatient Medications    Scheduled Meds: . amLODipine  10 mg Oral Daily  . furosemide  40 mg Oral Daily  . heparin injection (subcutaneous)  5,000 Units Subcutaneous Q8H  . isosorbide mononitrate  60 mg Oral Daily  . nicotine  7 mg Transdermal Daily   Continuous Infusions:  PRN Meds: albuterol, diphenhydrAMINE   Vital Signs    Vitals:   05/22/18 0421 05/22/18 1415 05/22/18 2118 05/23/18 0700  BP:  127/82 139/87 133/90  Pulse:  96 96 85  Resp:   18 16  Temp: (!) 97.5 F (36.4 C) 98.4 F (36.9 C) 98 F (36.7 C) 98.5 F (36.9 C)  TempSrc: Oral Oral Oral Oral  SpO2:  97% 98%   Weight:    33.9 kg  Height:        Intake/Output Summary (Last 24 hours) at 05/23/2018 0827 Last data filed at 05/23/2018 0500 Gross per 24 hour  Intake 1436 ml  Output 2750 ml  Net -1314 ml   Filed Weights   05/20/18 0534 05/21/18 0529 05/23/18 0700  Weight: 39.7 kg 34.5 kg 33.9 kg    Physical Exam   General: Frail, older than stated age, NAD Skin: Warm, dry, intact with diffuse fluid filled whelps over bilateral upper extremities  Head: Normocephalic, atraumatic, clear, moist mucus membranes. Neck: Negative for carotid bruits. No JVD Lungs:Clear to ausculation bilaterally. No wheezes, rales, or rhonchi. Breathing is unlabored. Cardiovascular: RRR with S1 S2. + murmur Abdomen: Soft, non-tender, non-distended with normoactive bowel sounds. No obvious abdominal masses. MSK: Strength and tone appear normal for age. 5/5 in all extremities Extremities: Bilateral AKA Neuro: Alert and oriented. No focal deficits. No facial asymmetry. MAE spontaneously. Psych: Responds to questions appropriately with normal affect.    Labs     Chemistry Recent Labs  Lab 05/19/18 2116  05/21/18 0507 05/21/18 1027 05/22/18 0351  NA 135   < > 139 135 138  K 3.4*   < > 2.9* 2.6* 5.4*  CL 106   < > 99 98 104  CO2 19*   < > 26 25 28   GLUCOSE 108*   < > 84 131* 78  BUN 18   < > 15 16 22*  CREATININE 0.74   < > 0.88 0.82 0.85  CALCIUM 8.8*   < > 8.6* 8.5* 9.4  PROT 5.7*  --  6.1* 6.1*  --   ALBUMIN 3.0*  --  2.9* 3.0*  --   AST 36  --  30 35  --   ALT 101*  --  78* 78*  --   ALKPHOS 95  --  96 98  --   BILITOT 1.7*  --  2.1* 2.1*  --   GFRNONAA >60   < > >60 >60 >60  GFRAA >60   < > >60 >60 >60  ANIONGAP 10   < > 14 12 6    < > = values in this interval not displayed.    Hematology Recent Labs  Lab 05/19/18 2116 05/21/18 1027 05/22/18 0351  WBC 5.9 6.0 5.2  RBC 3.83* 4.46 4.03  HGB 10.4* 12.1 11.0*  HCT 36.2 38.7 35.5*  MCV 94.5 86.8 88.1  MCH 27.2 27.1 27.3  MCHC 28.7* 31.3  31.0  RDW 16.2* 15.8* 15.6*  PLT 216 214 189   Cardiac Enzymes Recent Labs  Lab 05/19/18 2116 05/20/18 0315 05/20/18 0836  TROPONINI 0.07* 0.05* 0.04*   No results for input(s): TROPIPOC in the last 168 hours.   BNP Recent Labs  Lab 05/19/18 2116  BNP 3,603.1*    DDimer No results for input(s): DDIMER in the last 168 hours.   Radiology    No results found.  Telemetry    NSR - Personally Reviewed  ECG    NSR with non specific T wave inversion- Personally Reviewed  Cardiac Studies   Echo 05/20/18 Left ventricle: The cavity size was normal. Wall thickness was increased in a pattern of mild LVH. Systolic function was normal. The estimated ejection fraction was in the range of 60% to 65%. Wall motion was normal; there were no regional wall motion abnormalities. Features are consistent with a pseudonormal left ventricular filling pattern, with concomitant abnormal relaxation and increased filling pressure (grade 2 diastolic dysfunction). - Ventricular septum: Mildly D-shaped interventricular  septum suggesting RV pressure/volume overload. - Aortic valve: There was no stenosis. - Mitral valve: There is restriction of the posterior leaflet. There was moderate to severe regurgitation. - Left atrium: The atrium was moderately to severely dilated. - Right ventricle: The cavity size was normal. Systolic function was mildly reduced. - Right atrium: The atrium was moderately dilated. - Tricuspid valve: Peak RV-RA gradient (S): 53 mm Hg. - Pulmonary arteries: PA peak pressure: 56 mm Hg (S). - Inferior vena cava: The vessel was normal in size. The respirophasic diameter changes were in the normal range (>= 50%), consistent with normal central venous pressure. - Pericardium, extracardiac: A trivial pericardial effusion was identified.  Impressions:  - Normal LV size with mild LV hypertrophy. EF 60-65%. Moderate diastolic dysfunction. Thickened mitral valve with restricted posterior leaflet, moderate to possibly severe mitral regurgitation. Would consider TEE to investigate more closely. Mildly D-shaped interventricular septum suggesting RV pressure/volume overload. Normal RV size with mildly decreased systolic function. Moderate pulmonary hypertension.  Patient Profile     57 y.o. female with PMH hypertension, cocaine abuse, tobacco abuse, history of vasculitis s/p bilateral AKA who was seen at the request of Dr. Thereasa Solo for chest pain and elevated troponins.   Assessment & Plan    1.  Chest pain in the setting of elevated blood pressure and troponin with history of cocaine abuse: -Echocardiogram with normal wall motion and EF with no plans for ischemic work-up at this time per cardiology note  -Patient reports using cocaine approximately 1 week ago, UDS positive for cocaine -Troponin, 0.07>0.05>0.04 -No further CP since arrival>>>most concern is with whelps from medication allergy? Imdur, hydralazine and multiple PRN medications are new this admission   -Consider stopping Imdur for now and continue to assess for allergic reaction  -May benefit from topical steroid or consider derm consult   2.  Essential hypertension: -Stable, 133/90, 139/87, 127/82 -Continue amlodipine 10 mg daily, Lasix 40 mg daily -Imdur 60 mg daily -Patient with itchy "whelps" which began on 05/22/2018>>>holding hydralazine, Benadryl given with improvement although they continue to appear -Would consider eliminating more medications if possible   3.  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  Hyperkalemia: -K, 5.4 today>>> patient was hypokalemic prior to today  -Not currently on K supplementation -Will monitor for arrhythmias per telemetry review  5.  Severe MR on echocardiogram: -Attempted afterload reduction with hydralazine/nitrate, pending rash monitoring>>>> hydralazine currently on hold -Consider  TEE to further evaluate if she become symptomatic  Signed, Kathyrn Drown NP-C [facility] Pager: [phone] 05/23/2018, 8:27 AM     For questions or updates, please contact   Please consult [email or web address] for contact info under Cardiology/STEMI.  Attending Note:   The patient was seen and examined.  Agree with assessment and plan as noted above.  Changes made to the above note as needed.  Patient seen and independently examined with Kathyrn Drown, NP .   We discussed all aspects of the encounter. I agree with the assessment and plan as stated above.  1.  Troponin elevation: In the setting of elevated blood pressure and cocaine use.  Plans for ischemic work-up at this time.  Continue medical therapy.  2.  Hypertension: Blood pressure is fairly well controlled.  Dralzine is currently on hold.  3.  Mod- Severe mitral regurgitation: Any medical therapy.  4.  Pulmonary HTN:  Est. PA pressure of 53 mmHg.   5.  Skin lesions:   Fluid filled skin lesions.   I would think these are atypical for a drug rash but would defer to  Dr. Loleta Books for further evaluation.   I have spent a total of 40 minutes with patient reviewing hospital  notes , telemetry, EKGs, labs and examining patient as well as establishing an assessment and plan that was discussed with the patient. > 50% of time was spent in direct patient care.    Thayer Headings, Brooke Bonito., MD, [facility] 05/23/2018, 9:27 AM [address]  [place] Pager [phone]

## 2018-05-23 NOTE — Care Management Note (Addendum)
Case Management Note  Patient Details  Name: [name] MRN: [id number] Date of Birth: 03-May-1961  Subjective/Objective: Pt presented for Chest Pain- Pt has PCP at the [facility]. Per office pt has been noncompliant with making it to her scheduled appointments. PTA from home with the support of brother. Medications from [facility] [employer].                   Action/Plan: CM was able to schedule the hospital follow up appointment and placed on AVS. CM will provide patient information on Medicaid Transportation so she can get to scheduled appointments. CM did discuss that patient needs to be more compliant with plan of care and this means making the scheduled visits. CM did give patient 3 bus passes to get to appointment on Monday. No further needs from CM at this time.   Expected Discharge Date:                  Expected Discharge Plan:  Home/Self Care  In-House Referral:  PCP / [facility]  Discharge planning Services  CM Consult, Follow-up appt scheduled, Medication Assistance, [facility]  Post Acute Care Choice:  NA Choice offered to:  NA  DME Arranged:  N/A DME Agency:  NA  HH Arranged:  NA HH Agency:  NA  Status of Service:  Completed, signed off  If discussed at [place] of Stay Meetings, dates discussed:    Additional Comments: 1315 05-23-18 Cristina Krauss, RN,BSN [phone] [facility] -notified for 2:00 pm pickup for transport home. Patient states she has a key to get into the home. No further needs from CM @ this time.   1236 05-23-18 Cristina Krauss, RN,BSN [phone] CM did speak with [name] for [facility]- they will follow the patient in the community. Patient will need Ambulance transport home. Address Verified and [facility] to be called for transition home. No further needs at this time.  Cristina Roys, RN 05/23/2018, 11:53 AM

## 2018-05-23 NOTE — Clinical Social Work Note (Signed)
Clinical Social Work Assessment  Patient Details  Name: [name] MRN: [id number] Date of Birth: 1961-03-21  Date of referral:  05/23/18               Reason for consult:  [employer], Substance Use/ETOH Abuse                Permission sought to share information with:    Permission granted to share information::     Name::        Agency::     Relationship::     Contact Information:     Housing/Transportation Living arrangements for the past 2 months:  Single Family Home Source of Information:  Patient Patient Interpreter Needed:  None Criminal Activity/Legal Involvement Pertinent to Current Situation/Hospitalization:  No - Comment as needed Significant Relationships:  Siblings Lives with:  Self Do you feel safe going back to the place where you live?  Yes Need for family participation in patient care:  No (Coment)  Care giving concerns: Patient from home. Patient positive for cocaine on admission. Patient also in need of transportation resources. Discharge today.   Social Worker assessment / plan: CSW met with patient at bedside. Patient alert and oriented. CSW introduced self and role and assessed patient substance use. Patient not ready to change her substance use, declining resources.   Patient indicated she has applied for [employer] transportation and usually takes the regular fixed route bus service. CSW provided list of transportation resources in [place], including [employer] number to follow up on her application, and information on Medicaid transportation.   CSW signing off, as no additional needs identified and patient discharging today.  Employment status:  Disabled (Comment on whether or not currently receiving Disability) Insurance information:  Medicaid In [place] PT Recommendations:  Not assessed at this time Information / Referral to community resources:  Outpatient Substance Abuse Treatment Options, Residential Substance Abuse Treatment Options, Other (Comment  Required)(transportation)  Patient/Family's Response to care: Not discussed.  Patient/Family's Understanding of and Emotional Response to Diagnosis, Current Treatment, and Prognosis: Not discussed.  Emotional Assessment Appearance:  Appears stated age Attitude/Demeanor/Rapport:  Engaged Affect (typically observed):  Blunt, Flat Orientation:  Oriented to Self, Oriented to Place, Oriented to  Time, Oriented to Situation Alcohol / Substance use:  Not Applicable Psych involvement (Current and /or in the community):  No (Comment)  Discharge Needs  Concerns to be addressed:  Substance Abuse Concerns, Other (Comment Required(transportation) Readmission within the last 30 days:  No Current discharge risk:  Physical Impairment, Substance Abuse Barriers to Discharge:  No Barriers Identified   Estanislado Emms, LCSW 05/23/2018, 12:55 PM

## 2018-05-23 NOTE — Discharge Summary (Signed)
Physician Discharge Summary  [name] XKG:[id number] DOB: 01/26/1961 DOA: 05/19/2018  PCP: Cristina Jun, FNP  Admit date: 05/19/2018 Discharge date: 05/23/2018  Admitted From: Home  Disposition:  Home   Recommendations for Outpatient Follow-up:  1. Follow up with PCP in 5 days 2. Please assist patient for dermatology referral and skin biopsy of blisters 3. Please obtain BMP in 1 week 4. Repeat LFTs in 1 month 5. Follow up with Cardiology as directed, referral pending    Home Health: None  Equipment/Devices: None  Discharge Condition: Good  CODE STATUS: FULL Diet recommendation: Cardiac  Brief/Interim Summary: [name] is a 57 y.o. 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  In the ER she was found to have blood pressure 190/112, tachypnea to 40/min, but normal O2 sat on room air.  Chest x-ray showed moderate-sized right pleural effusion.  BNP 3600.  EKG with Q waves, otherwise nonspecific T wave changes.  She was admitted for chest pain with hypertensive urgency.     Discharge Diagnoses:   Chest pain This resolved alone.  Echocardiogram showed no wall motion abnormalities, and serial troponins were negative.  Cardiology evaluated the patient and recommended no further inpatient testing.  Likely pain from cocaine use.  Imdur was started, then stopped due to rash (see below) -Continue aspirin -Consider statin if LFTs normalize  Bullous rash This started 8/18.  Initially was edematous itchy plaques, I.e. Hives, NOT blisters.  However, overnight, it has evolved into a bullous rash.  There are about 10 lesions, from 1-3cm in diameter, scattered fluid filled flaccid bullae, erythematous base.    Differential includes pemphigus vulgaris, less likely bullous pemph.  No oral lesions or systemic symptoms at all to suggest SJS, this is highly doubted.  Disseminated nature argues  against infection.  Her previous levisamole vasculitis raises question of if this is a vasculitis, but the differential there may need to be elucidated by a skin specialist. -Hydralazine and Imdur were stopped, although I agree with Cardiology, this doesn't look like a drug rash -Needs urgent referral to Dermatology    Transaminitis Resolving, likely cocaine induced injury. -Follow-up AST and ALT as an outpatient  Hypertensive urgency From cocaine.  Has improved.  Hypokalemia Resolved  Hypomagnesemia    Discharge Instructions  Discharge Instructions    Diet - low sodium heart healthy   Complete by:  As directed    Discharge instructions   Complete by:  As directed    From Dr. Loleta Books and the Hospitalist team: You were admitted for chest pain and high blood pressure.   This appears to have been from cocaine, because your blood pressure came down well just with being in the hospital, as well as your home medicines, Norvasc and furosemide. Continue to take amlodipine 10 mg daily and furosemide 40 mg daily You should take Potassium 10 mEq daily every time you take furosemide.  Follow up with the Cardiology doctors in their office as they instructed.  See [name] on Aug 26 (next Monday). I have sent her this discharge summary, as well as a request to help you set up a dermatology referral for that rash.  If the blisters open up, please keep them clean and covered.   Wash with soap and water.  Put a bandaid on them.  Show them to Dr. Kenton [name].   Increase activity slowly   Complete by:  As directed  Allergies as of 05/23/2018      Reactions   Acetaminophen Swelling, Other (See Comments)   Reaction:  Eyelid swelling   Lisinopril    UNSPECIFIED REACTION       Medication List    STOP taking these medications   gabapentin 100 MG capsule Commonly known as:  NEURONTIN   ranitidine 150 MG tablet Commonly known as:  ZANTAC     TAKE these medications    amLODipine 10 MG tablet Commonly known as:  NORVASC Take 1 tablet (10 mg total) by mouth daily. Start taking on:  05/24/2018   furosemide 40 MG tablet Commonly known as:  LASIX Take 1 tablet (40 mg total) by mouth daily. Start taking on:  05/24/2018   nicotine 7 mg/24hr patch Commonly known as:  NICODERM CQ - dosed in mg/24 hr Place 1 patch (7 mg total) onto the skin daily.   potassium chloride 10 MEQ tablet Commonly known as:  K-DUR Take 1 tablet (10 mEq total) by mouth daily.      Follow-up [name]. Go on 05/30/2018.   Specialty:  Internal Medicine Why:  @ 1:20 pm for hospital follow up appointment with Cristina Becton FNP. Please make sure you can make it to this appointment and if not please call to reschedule as soon as possible.  Contact information: [place] [address] [phone]         Allergies  Allergen Reactions  . Acetaminophen Swelling and Other (See Comments)    Reaction:  Eyelid swelling  . Lisinopril     UNSPECIFIED REACTION     Consultations:  Cardiology   Procedures/Studies: Dg Chest 2 View  Result Date: 05/19/2018 CLINICAL DATA:  Shortness of breath and left-sided chest pain for 2 days. EXAM: CHEST - 2 VIEW COMPARISON:  04/26/2018 FINDINGS: Enlarged cardiac silhouette with preferential enlargement of the left ventricle. Calcific atherosclerotic disease of the aorta. Mediastinal contours appear intact. Right pleural effusion with associated compressive atelectasis. Osseous structures are without acute abnormality. Soft tissues are grossly normal. IMPRESSION: Enlarged cardiac silhouette with preferential enlargement of the left chambers. Moderate in size right pleural effusion. Calcific atherosclerotic disease of the aorta. Electronically Signed   By: Fidela Salisbury M.D.   On: 05/19/2018 21:08   Dg Chest 2 View  Result Date: 04/26/2018 CLINICAL DATA:  Chest pain and cough  for 2 weeks. EXAM: CHEST - 2 VIEW COMPARISON:  02/02/2018 FINDINGS: Mild cardiac enlargement. Shallow inspiration. Previous right lung infiltrates have resolved. There is a small right pleural effusion with mild basilar atelectasis. No pneumothorax. Calcification of the aorta. Mediastinal contours appear intact. IMPRESSION: Small right pleural effusion with basilar atelectasis. Resolution of previous right lung infiltrates. Electronically Signed   By: Lucienne Capers M.D.   On: 04/26/2018 01:52   Echo LV EF: 60% -   65%  ------------------------------------------------------------------- Indications:      Chest pain 786.51.  ------------------------------------------------------------------- History:   Risk factors:  Cocaine abuse. Hypertension. Diabetes mellitus.  ------------------------------------------------------------------- Study Conclusions  - Left ventricle: The cavity size was normal. Wall thickness was   increased in a pattern of mild LVH. Systolic function was normal.   The estimated ejection fraction was in the range of 60% to 65%.   Wall motion was normal; there were no regional wall motion   abnormalities. Features are consistent with a pseudonormal left   ventricular filling pattern, with concomitant abnormal relaxation   and increased filling  pressure (grade 2 diastolic dysfunction). - Ventricular septum: Mildly D-shaped interventricular septum   suggesting RV pressure/volume overload. - Aortic valve: There was no stenosis. - Mitral valve: There is restriction of the posterior leaflet.   There was moderate to severe regurgitation. - Left atrium: The atrium was moderately to severely dilated. - Right ventricle: The cavity size was normal. Systolic function   was mildly reduced. - Right atrium: The atrium was moderately dilated. - Tricuspid valve: Peak RV-RA gradient (S): 53 mm Hg. - Pulmonary arteries: PA peak pressure: 56 mm Hg (S). - Inferior vena cava: The  vessel was normal in size. The   respirophasic diameter changes were in the normal range (>= 50%),   consistent with normal central venous pressure. - Pericardium, extracardiac: A trivial pericardial effusion was   identified.  Impressions:  - Normal LV size with mild LV hypertrophy. EF 60-65%. Moderate   diastolic dysfunction. Thickened mitral valve with restricted   posterior leaflet, moderate to possibly severe mitral   regurgitation. Would consider TEE to investigate more closely.   Mildly D-shaped interventricular septum suggesting RV   pressure/volume overload. Normal RV size with mildly decreased   systolic function. Moderate pulmonary hypertension.    Subjective: Feeling stable.  One new lesion on her palm overnight.  The lesions are now fluid filled. There is no oral lesions, oral soreness, no skin soreness, no fever, malaise,  No confusion, weakness.  No chest pain or dyspnea. No headache.   Discharge Exam: Vitals:   05/23/18 0700 05/23/18 1210  BP: 133/90 129/85  Pulse: 85 86  Resp: 16 20  Temp: 98.5 F (36.9 C) 98.3 F (36.8 C)  SpO2:  100%   Vitals:   05/22/18 1415 05/22/18 2118 05/23/18 0700 05/23/18 1210  BP: 127/82 139/87 133/90 129/85  Pulse: 96 96 85 86  Resp:  18 16 20   Temp: 98.4 F (36.9 C) 98 F (36.7 C) 98.5 F (36.9 C) 98.3 F (36.8 C)  TempSrc: Oral Oral Oral Oral  SpO2: 97% 98%  100%  Weight:   33.9 kg   Height:        General: Pt is alert, awake, not in acute distress, lying in bed Cardiovascular: RRR, S1/S2 +, I actually do not appreciate an MR murmur Respiratory: CTA bilaterally, no wheezing, no rhonchi Abdominal: Soft, NT, ND, bowel sounds + Extremities: bilatearl AKA    The results of significant diagnostics from this hospitalization (including imaging, microbiology, ancillary and laboratory) are listed below for reference.     Microbiology: Recent Results (from the past 240 hour(s))  MRSA PCR Screening     Status: None    Collection Time: 05/20/18  1:48 AM  Result Value Ref Range Status   MRSA by PCR NEGATIVE NEGATIVE Final    Comment:        The GeneXpert MRSA Assay (FDA approved for NASAL specimens only), is one component of a comprehensive MRSA colonization surveillance program. It is not intended to diagnose MRSA infection nor to guide or monitor treatment for MRSA infections. Performed at [facility], [place] [address], [place], [place] [address]      Labs: BNP (last 3 results) Recent Labs    05/19/18 2116  BNP 0,177.9*   Basic Metabolic Panel: Recent Labs  Lab 05/20/18 0836 05/21/18 0507 05/21/18 1027 05/22/18 0351 05/23/18 0705  NA 136 139 135 138 134*  K 3.3* 2.9* 2.6* 5.4* 4.9  CL 105 99 98 104 101  CO2 20* 26 25  28 25  GLUCOSE 176* 84 131* 78 83  BUN 15 15 16  22* 21*  CREATININE 0.74 0.88 0.82 0.85 0.64  CALCIUM 8.7* 8.6* 8.5* 9.4 9.1  MG  --  1.6*  --  2.0  --    Liver Function Tests: Recent Labs  Lab 05/19/18 2116 05/21/18 0507 05/21/18 1027  AST 36 30 35  ALT 101* 78* 78*  ALKPHOS 95 96 98  BILITOT 1.7* 2.1* 2.1*  PROT 5.7* 6.1* 6.1*  ALBUMIN 3.0* 2.9* 3.0*   Recent Labs  Lab 05/19/18 2116  LIPASE 23   No results for input(s): AMMONIA in the last 168 hours. CBC: Recent Labs  Lab 05/19/18 2116 05/21/18 1027 05/22/18 0351  WBC 5.9 6.0 5.2  NEUTROABS 3.3  --   --   HGB 10.4* 12.1 11.0*  HCT 36.2 38.7 35.5*  MCV 94.5 86.8 88.1  PLT 216 214 189   Cardiac Enzymes: Recent Labs  Lab 05/19/18 2116 05/20/18 0315 05/20/18 0836  TROPONINI 0.07* 0.05* 0.04*   BNP: Invalid input(s): POCBNP CBG: Recent Labs  Lab 05/22/18 1105 05/22/18 1716 05/22/18 2115 05/23/18 0747 05/23/18 1209  GLUCAP 138* 108* 126* 106* 121*   D-Dimer No results for input(s): DDIMER in the last 72 hours. Hgb A1c No results for input(s): HGBA1C in the last 72 hours. Lipid Profile No results for input(s): CHOL, HDL, LDLCALC, TRIG, CHOLHDL, LDLDIRECT in the last 72  hours. Thyroid function studies Recent Labs    05/20/18 1308  TSH 1.930   Anemia work up No results for input(s): VITAMINB12, FOLATE, FERRITIN, TIBC, IRON, RETICCTPCT in the last 72 hours. Urinalysis    Component Value Date/Time   COLORURINE AMBER (A) 01/20/2018 1020   APPEARANCEUR HAZY (A) 01/20/2018 1020   LABSPEC 1.026 01/20/2018 1020   PHURINE 5.0 01/20/2018 1020   GLUCOSEU NEGATIVE 01/20/2018 1020   HGBUR LARGE (A) 01/20/2018 1020   BILIRUBINUR NEGATIVE 01/20/2018 1020   BILIRUBINUR small 04/29/2015 1533   KETONESUR NEGATIVE 01/20/2018 1020   PROTEINUR >=300 (A) 01/20/2018 1020   UROBILINOGEN 1.0 07/26/2017 1550   NITRITE NEGATIVE 01/20/2018 1020   LEUKOCYTESUR NEGATIVE 01/20/2018 1020   Sepsis Labs Invalid input(s): PROCALCITONIN,  WBC,  LACTICIDVEN Microbiology Recent Results (from the past 240 hour(s))  MRSA PCR Screening     Status: None   Collection Time: 05/20/18  1:48 AM  Result Value Ref Range Status   MRSA by PCR NEGATIVE NEGATIVE Final    Comment:        The GeneXpert MRSA Assay (FDA approved for NASAL specimens only), is one component of a comprehensive MRSA colonization surveillance program. It is not intended to diagnose MRSA infection nor to guide or monitor treatment for MRSA infections. Performed at [facility], [place] [address], [place], [place] [address]      Time coordinating discharge: 40 minutes       SIGNED:   Edwin Dada, MD  [facility] 05/23/2018, 12:14 PM

## 2018-05-29 IMAGING — DX DG TIBIA/FIBULA 2V*R*
2 series · 2 of 2 positions shown · non-contrast
Comparison: Radiographs September 25, 2015.

CLINICAL DATA: Right calf pain and open wounds.

EXAM:
RIGHT TIBIA AND FIBULA - 2 VIEW

[tibia ap (1 of 2)]
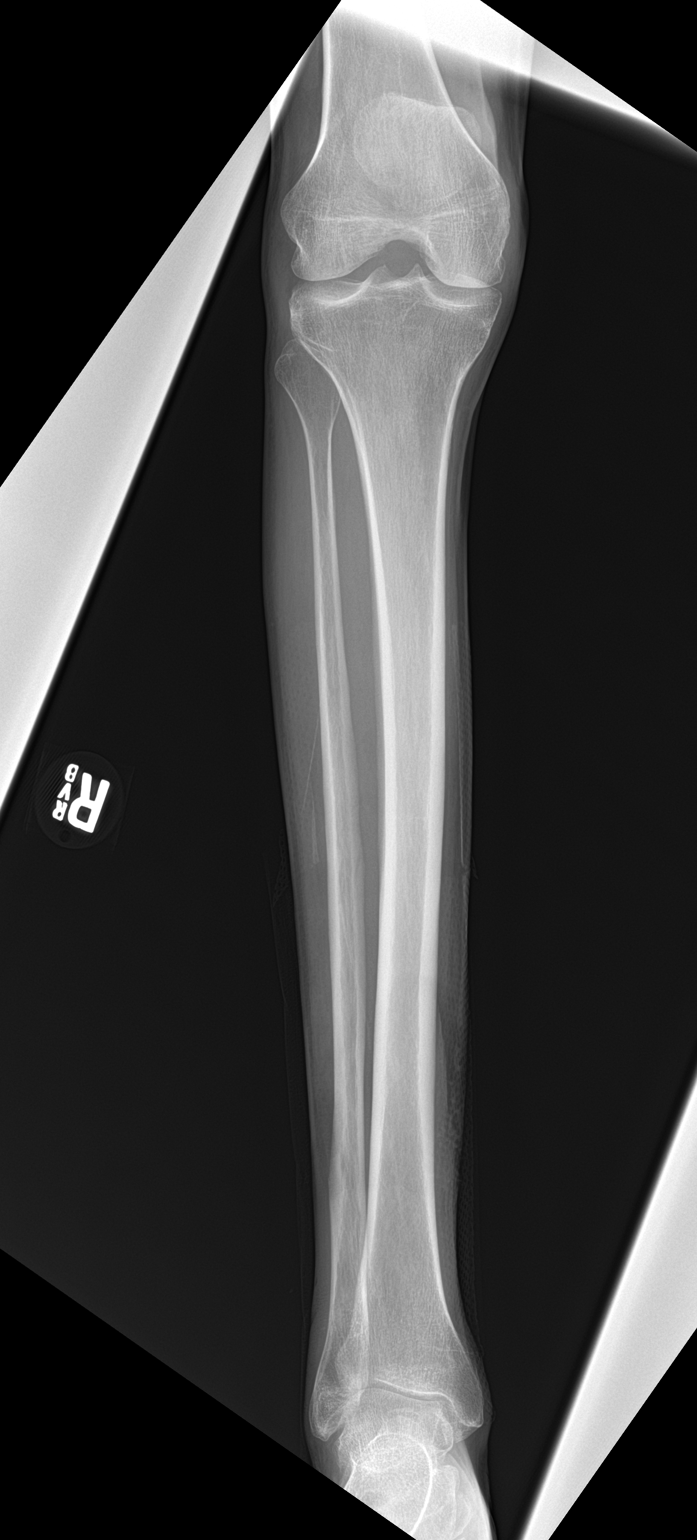

[tibia ap (2 of 2)]
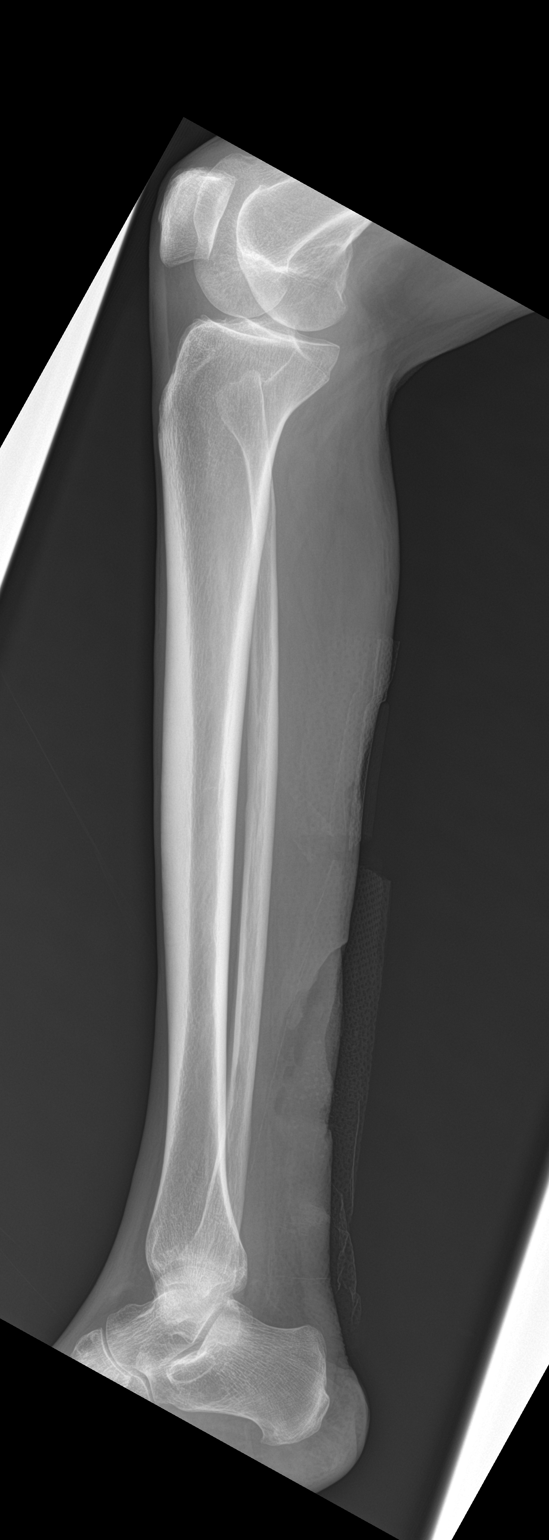

[2 of 2 positions shown; findings below may reference images not displayed]

FINDINGS: There is no evidence of fracture or other focal bone lesions.
Irregularity of posterior soft tissues of lower calf is noted
consistent with infection or injury. No radiopaque foreign body is
noted.
IMPRESSION: No fracture or dislocation is noted. Irregularity of posterior soft
tissues of lower calf consistent with infection or injury.

## 2018-05-29 IMAGING — DX DG ANKLE COMPLETE 3+V*L*
3 series · 3 of 3 positions shown · non-contrast
Comparison: Radiographs March 09, 2015.

CLINICAL DATA: Left ankle pain without known injury.

EXAM:
LEFT ANKLE COMPLETE - 3+ VIEW

[ankle ap]
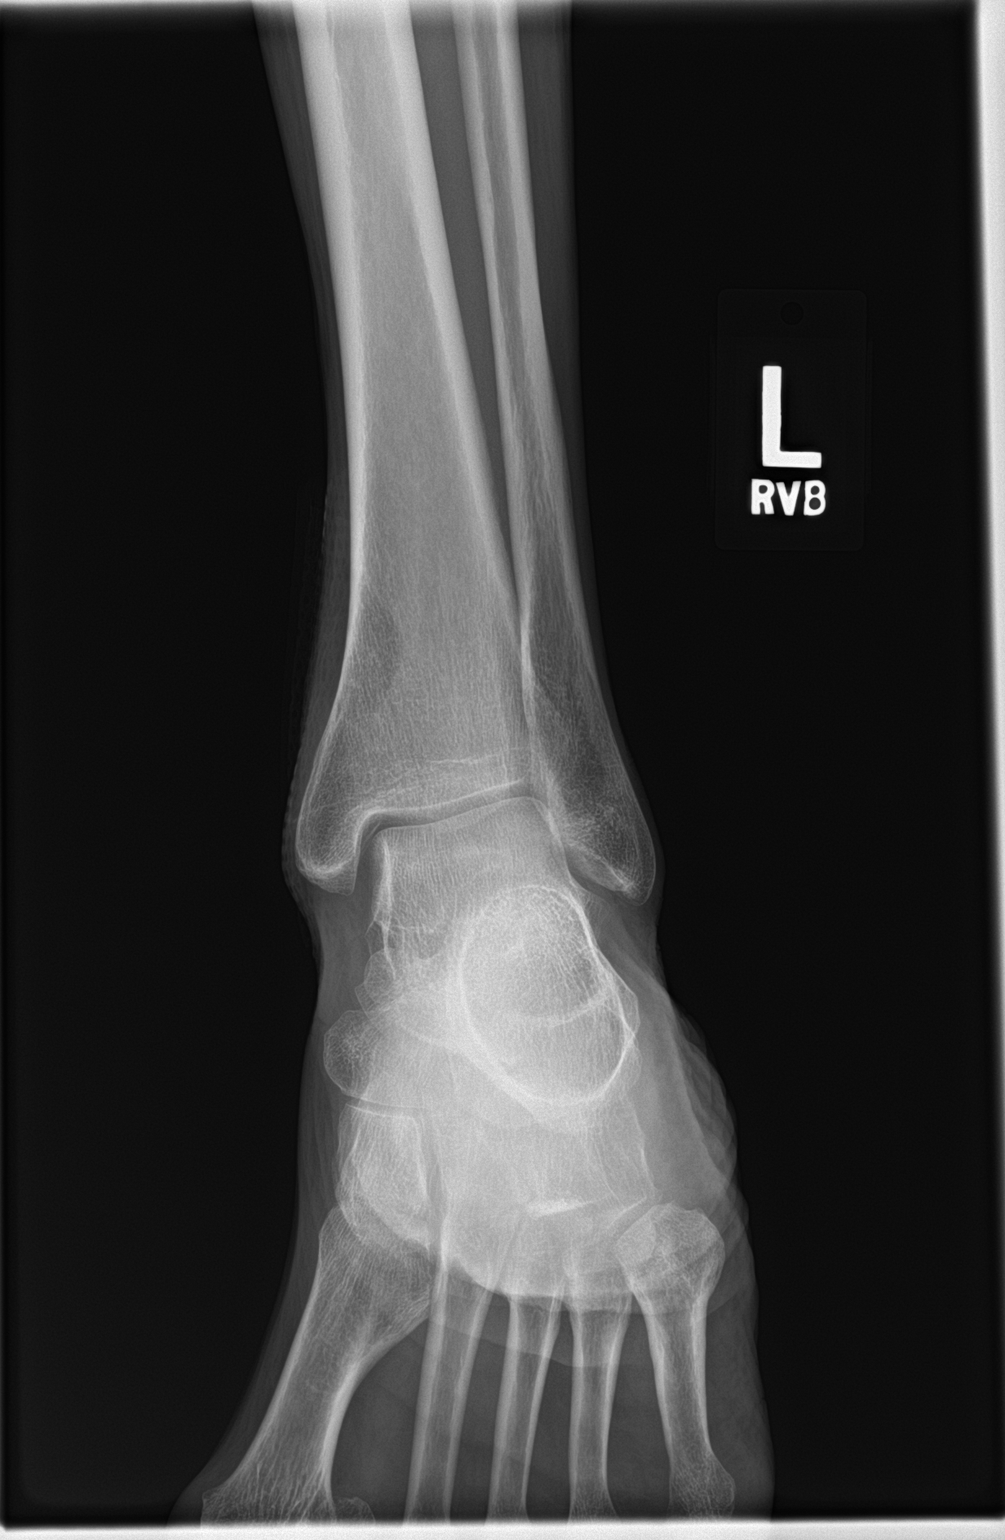

[ankle obl]
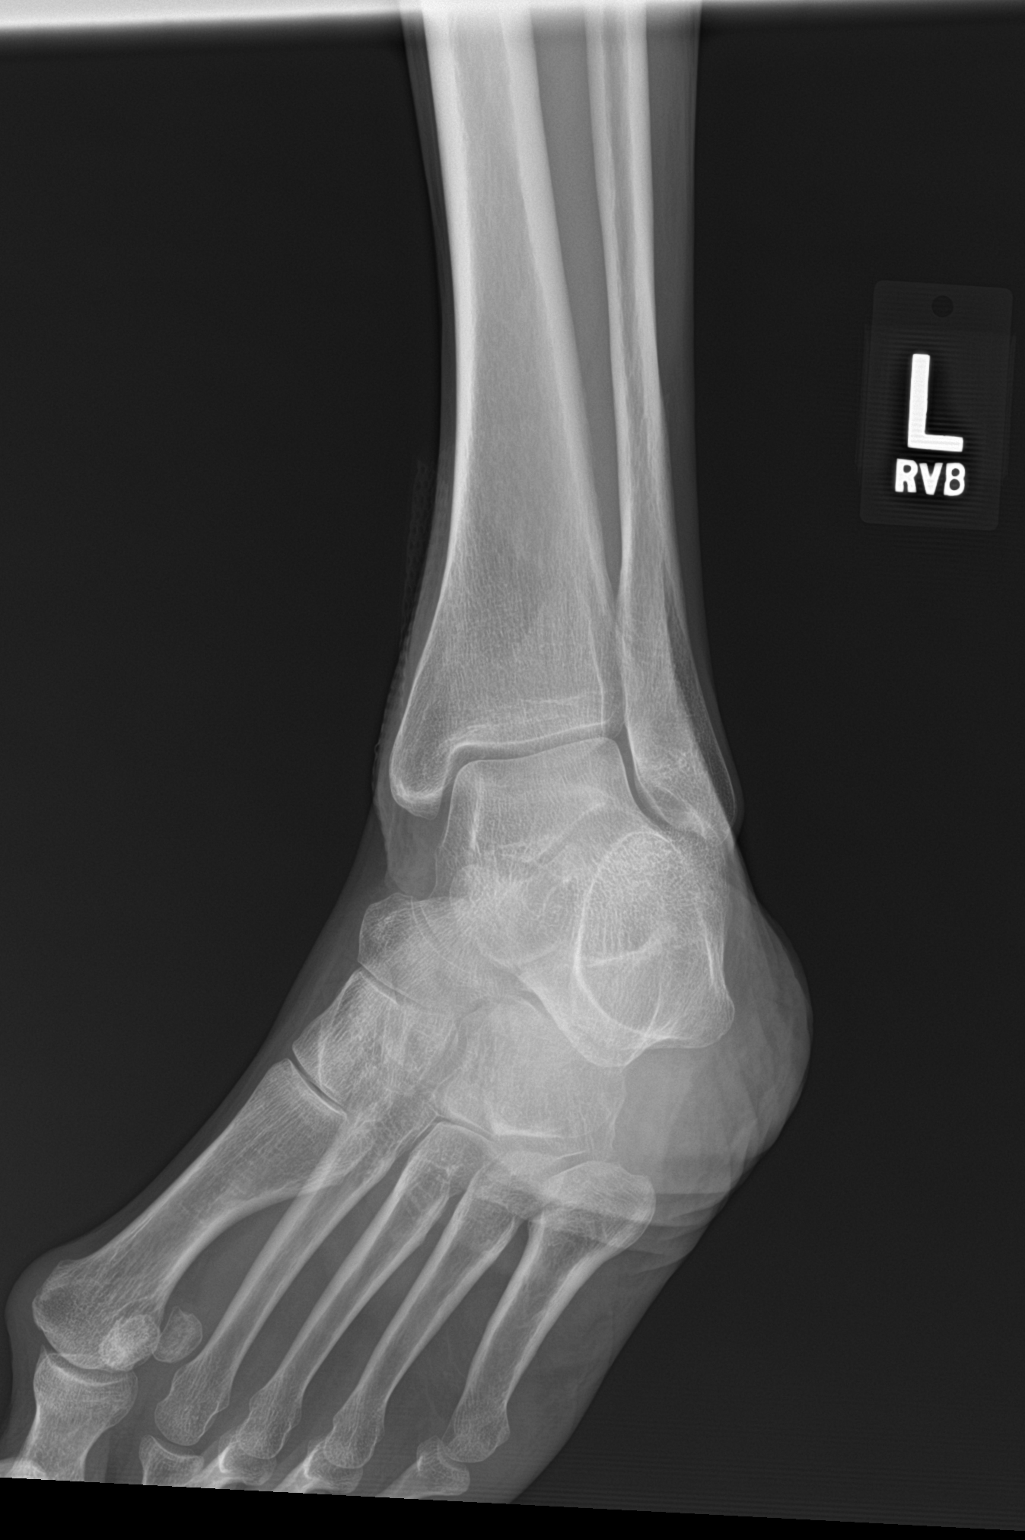

[ankle lat]
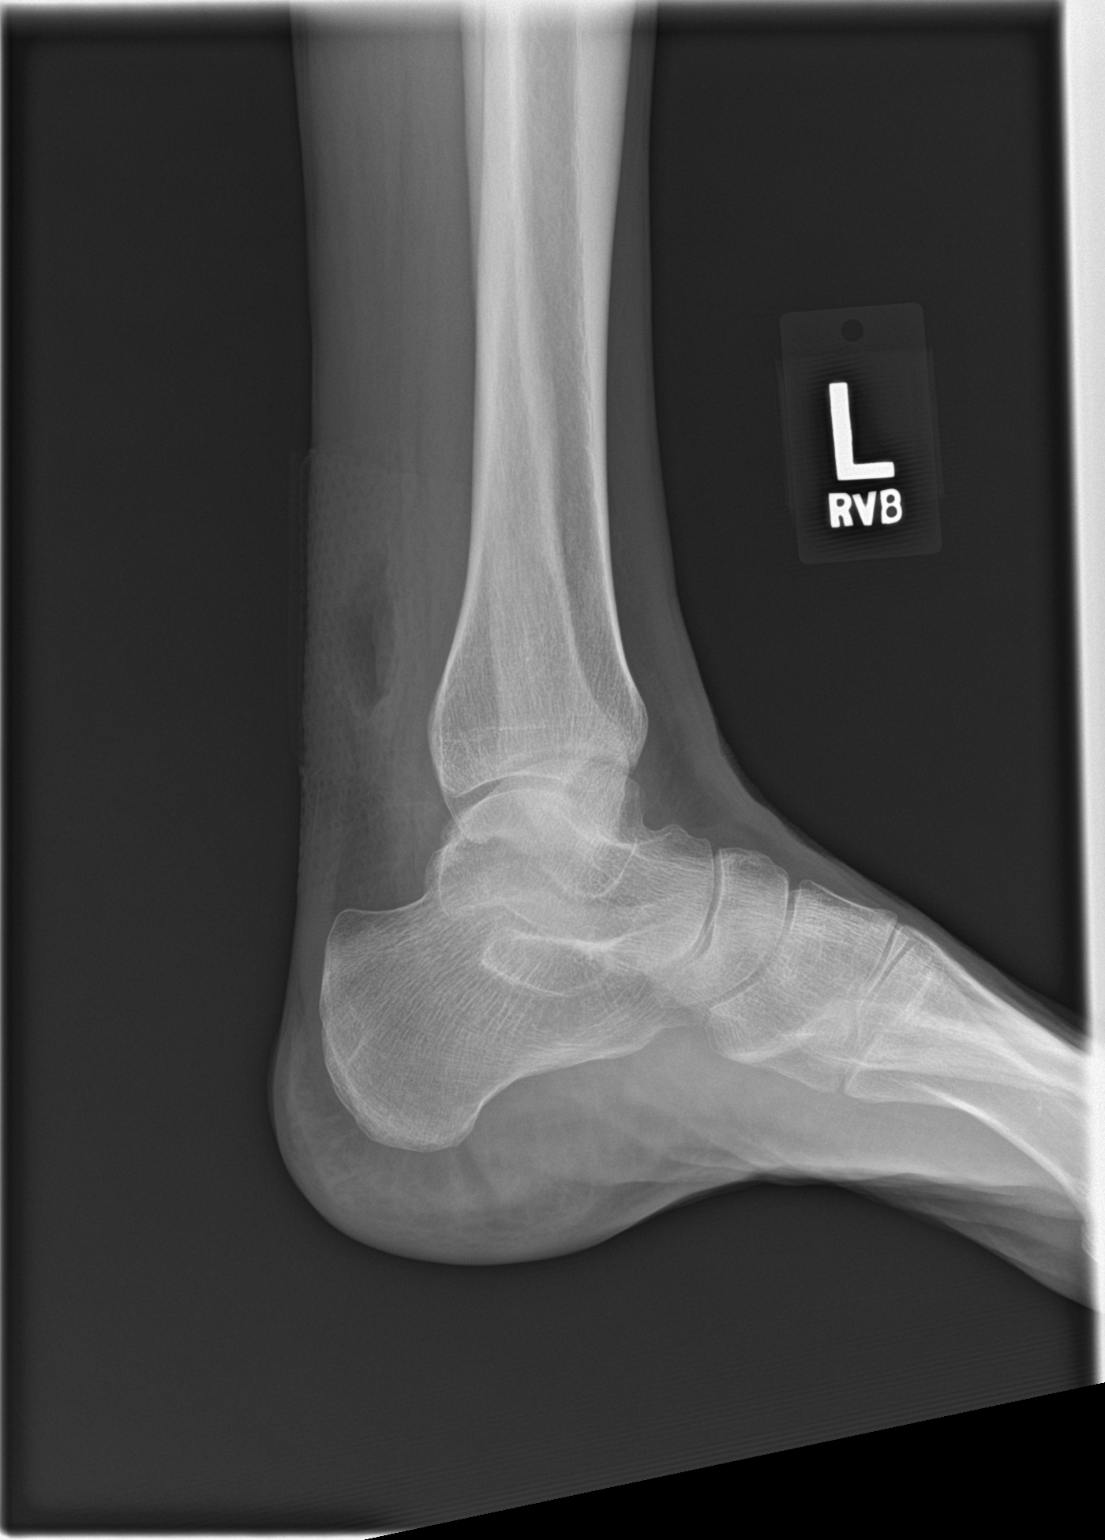

[3 of 3 positions shown; findings below may reference images not displayed]

FINDINGS: There is no evidence of fracture, dislocation, or joint effusion.
There is no evidence of arthropathy or other focal bone abnormality.
Soft tissue gas is noted posteriorly consistent with infection.
IMPRESSION: No definite osseous abnormality is noted. Soft tissue gas is seen
posterior to distal tibia concerning for infection.

## 2018-05-29 IMAGING — DX DG HAND COMPLETE 3+V*L*
3 series · 3 of 3 positions shown · non-contrast
Comparison: Radiographs May 19, 2016.

CLINICAL DATA: Pain to left middle finger amputation site.

EXAM:
LEFT HAND - COMPLETE 3+ VIEW

[hand ap]
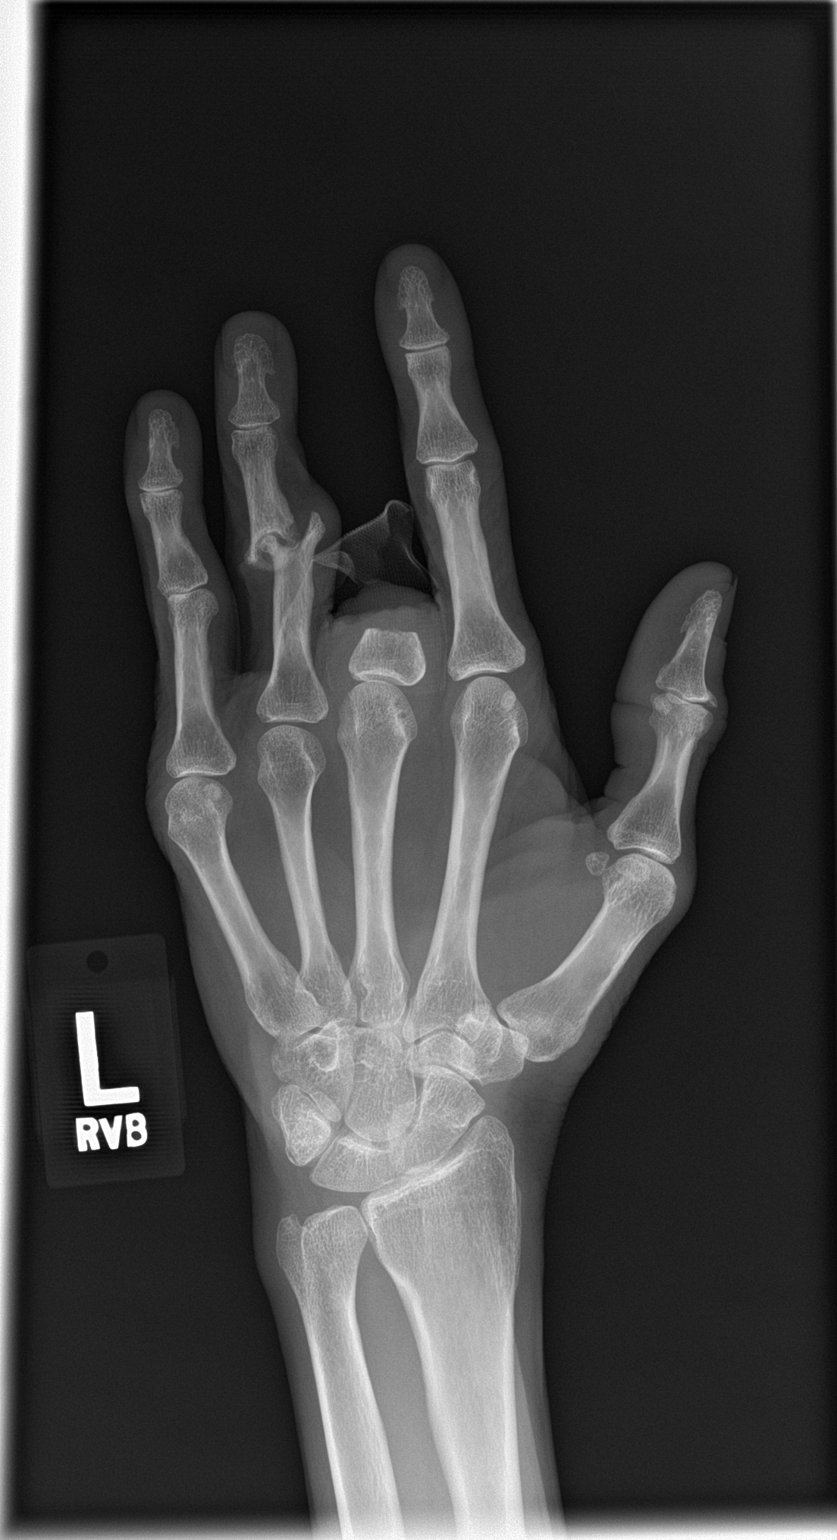

[hand obl]
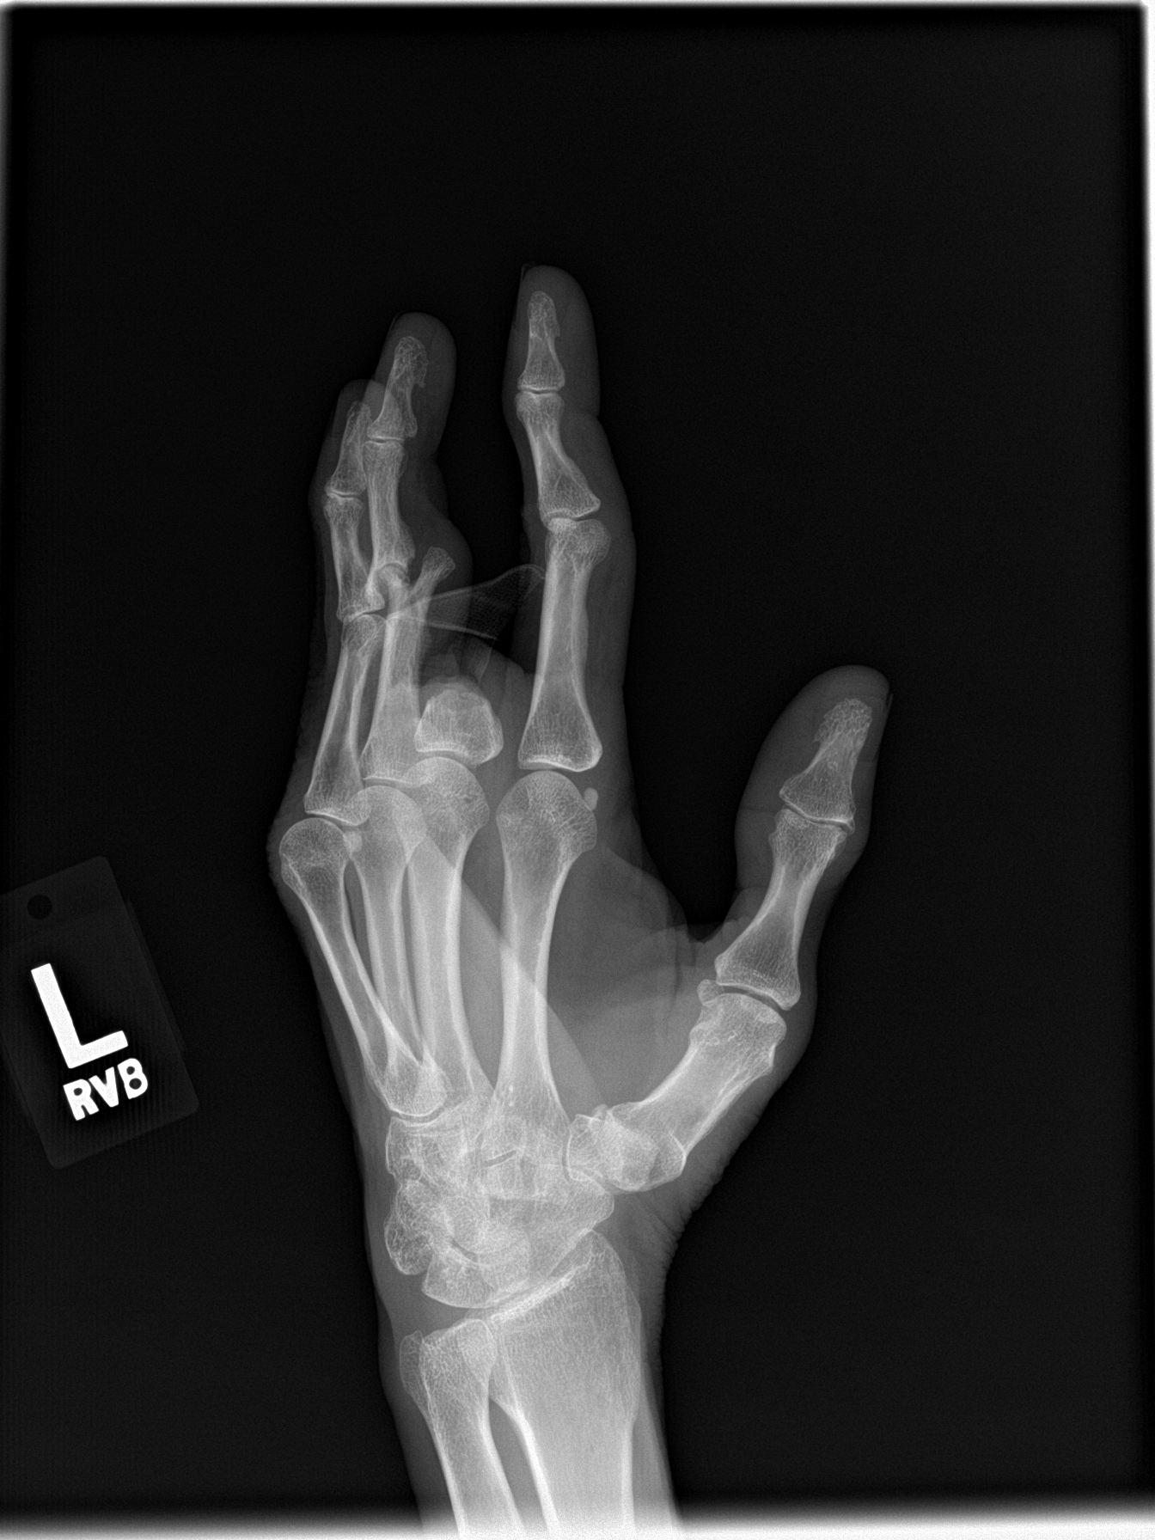

[hand lat]
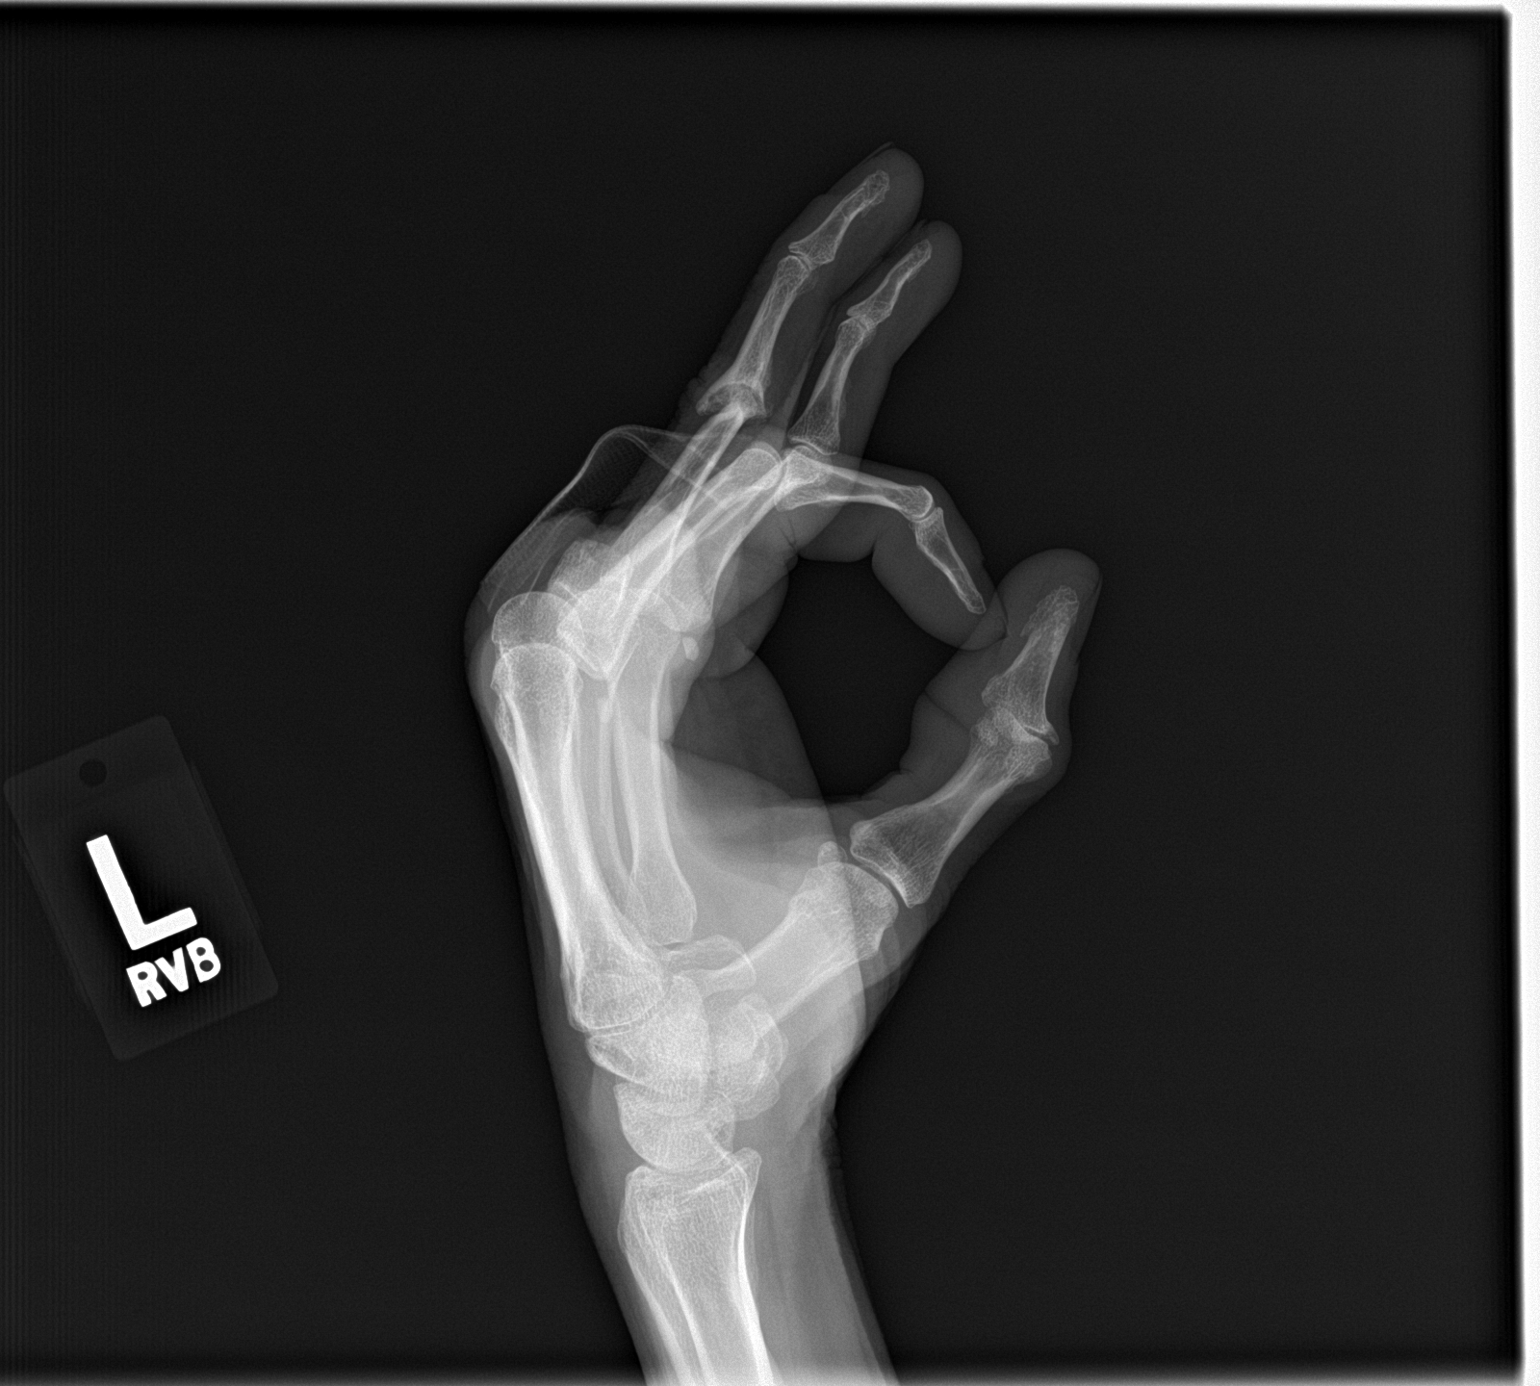

[3 of 3 positions shown; findings below may reference images not displayed]

FINDINGS: Status post surgical amputation of most of the third proximal
phalanx and more distal phalanges. Stable chronic erosive changes
are seen involving the proximal interphalangeal joint of the fourth
finger. No acute fracture or dislocation is noted.
IMPRESSION: Status post surgical amputation of phalanges of middle finger.
Stable chronic erosive changes involving proximal interphalangeal
joint of fourth finger.

## 2018-05-29 IMAGING — MR MR FOOT*R* WO/W CM
10 of 19 series · 22 of 40 positions shown · IV contrast (multihance)
Comparison: None.

CLINICAL DATA: History of cocaine use. Malodorous wounds with
coronal in discharge. Necrotic ulcers on her fingers and lower
extremities.

EXAM:
MRI OF LOWER RIGHT EXTREMITY WITHOUT AND WITH CONTRAST
MRI OF THE RIGHT FOREFOOT WITHOUT AND WITH CONTRAST
TECHNIQUE: Multiplanar, multisequence MR imaging of the right lower leg and
right ankle was performed both before and after administration of
intravenous contrast.
CONTRAST:  10mL MULTIHANCE GADOBENATE DIMEGLUMINE 529 MG/ML IV SOLN

[Series 7: T1 · oblique · 4.0mm · 0.70mm/px · 1 of 14 slices shown (1 of 6)]
[im 1/14]
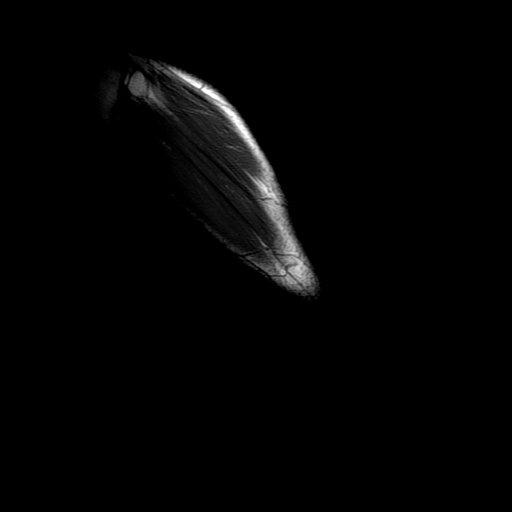

[Series 8: T1 · oblique · 4.0mm · 0.70mm/px · 2 of 23 slices shown (2 of 6)]
[im 1/23]
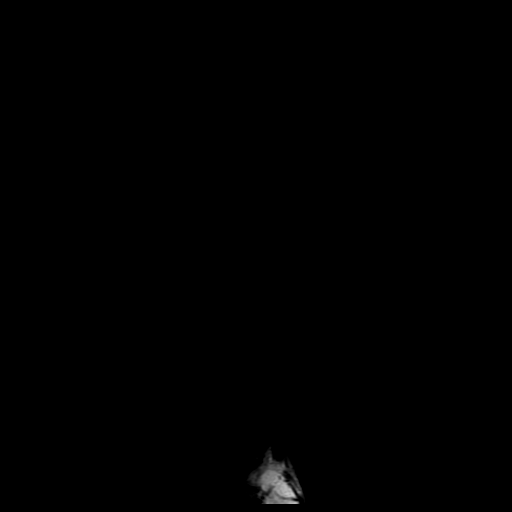
[im 23/23]
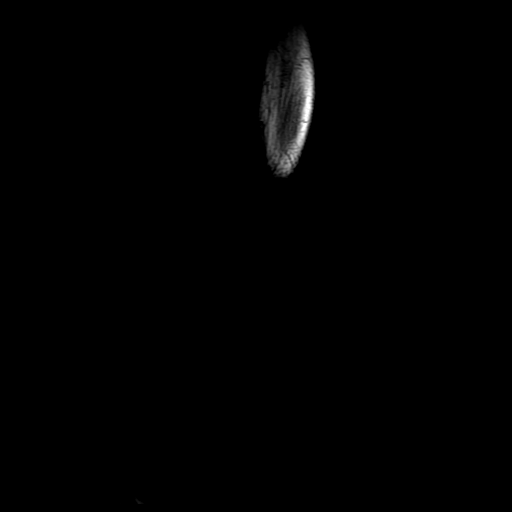

[Series 10: T1 · axial · 5.0mm · 0.29mm/px · z∈[-178,+73]mm · 4 of 50 slices shown (3 of 6)]
[im 1/50]
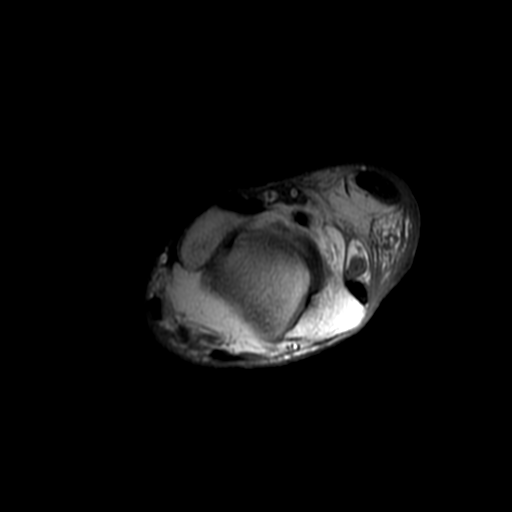
[im 17/50]
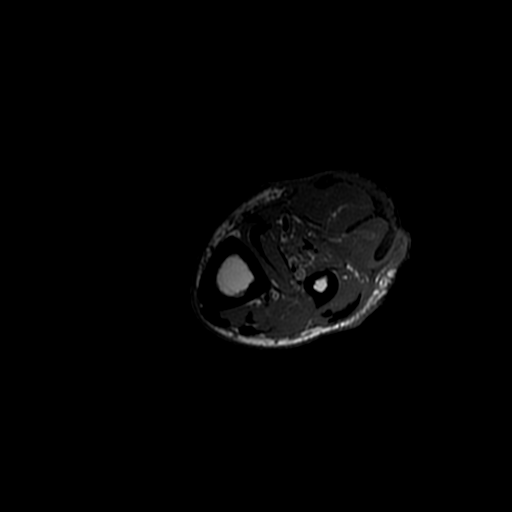
[im 33/50]
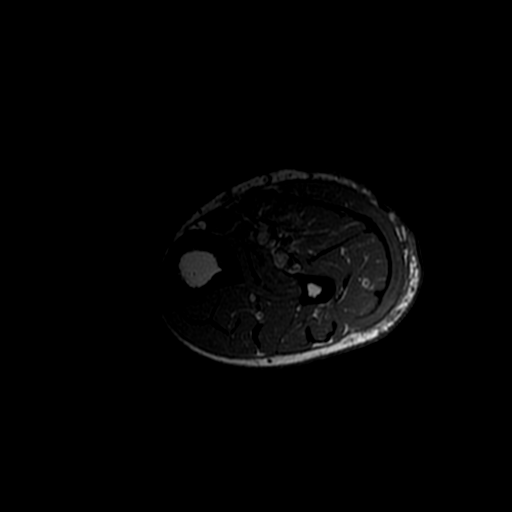
[im 50/50]
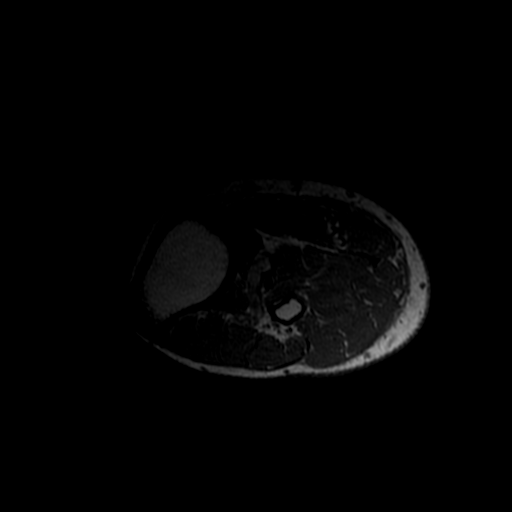

[Series 11: T1 fat-sat · axial · non-contrast · 5.0mm · 0.59mm/px · z∈[-178,+73]mm · 4 of 50 slices shown (1 of 3)]
[im 1/50]
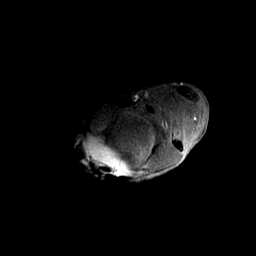
[im 17/50]
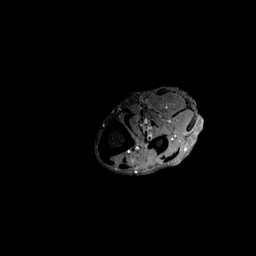
[im 33/50]
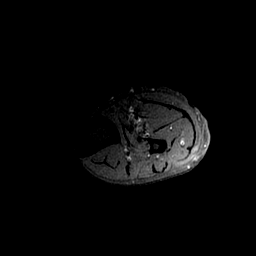
[im 50/50]
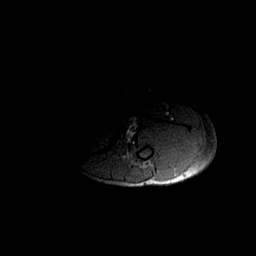

[Series 16: T1 fat-sat · axial · non-contrast · 4.0mm · 0.59mm/px · z∈[-66,+38]mm · 2 of 27 slices shown (2 of 3)]
[im 1/27]
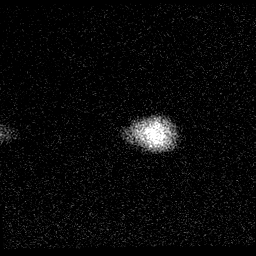
[im 27/27]
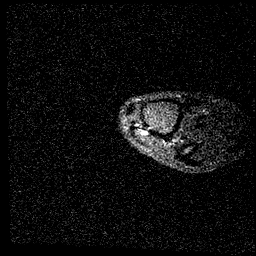

[Series 17: T1 · axial · 4.0mm · 0.29mm/px · z∈[-66,+38]mm · 2 of 27 slices shown (4 of 6)]
[im 1/27]
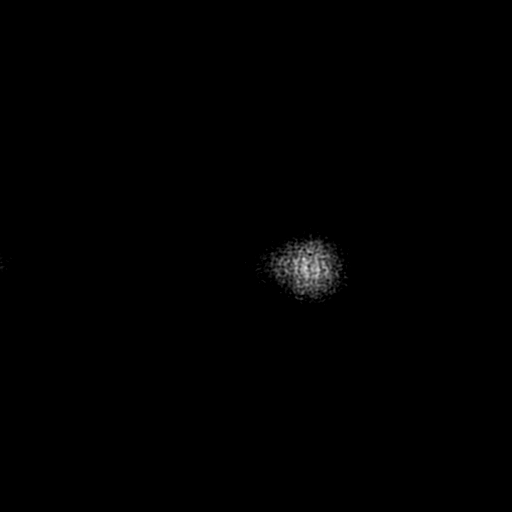
[im 27/27]
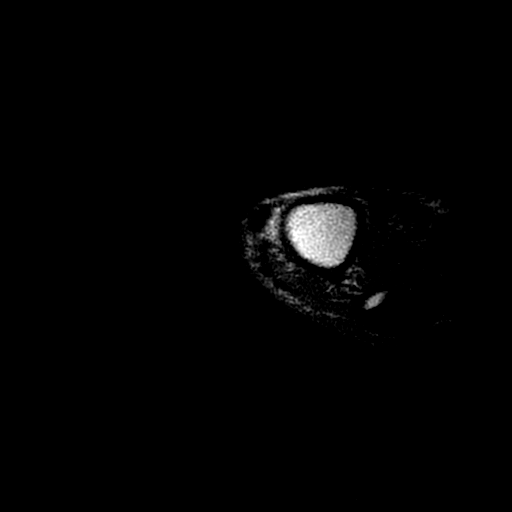

[Series 18: T2 fat-sat · axial · 4.0mm · 0.29mm/px · z∈[-66,+38]mm · 2 of 27 slices shown]
[im 1/27]
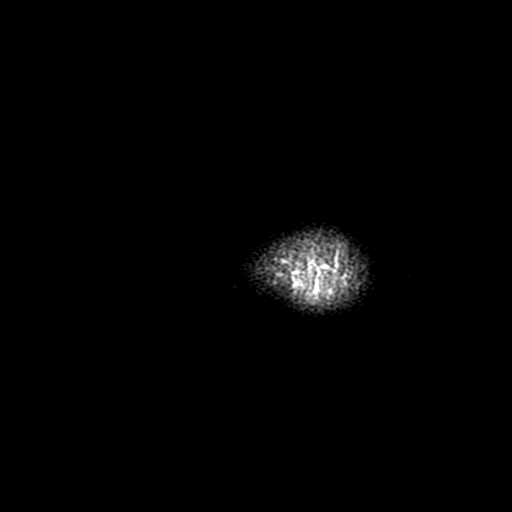
[im 27/27]
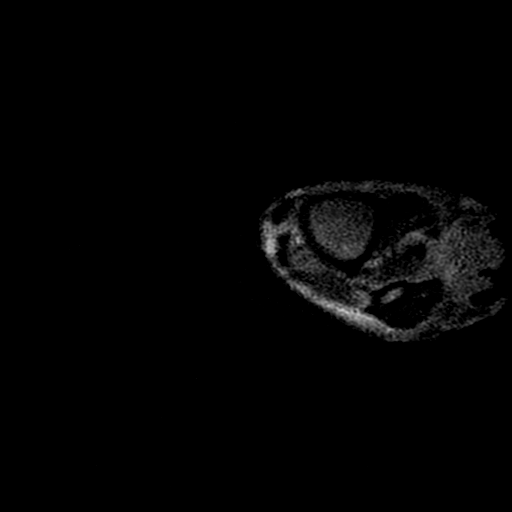

[Series 19: T1 · sagittal · 4.0mm · 0.29mm/px · 2 of 28 slices shown (5 of 6)]
[im 1/28]
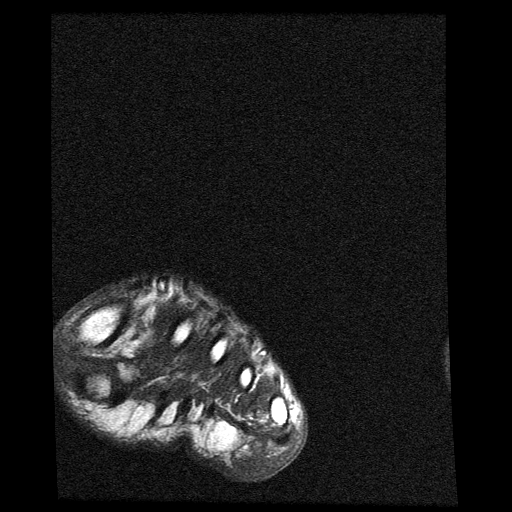
[im 28/28]
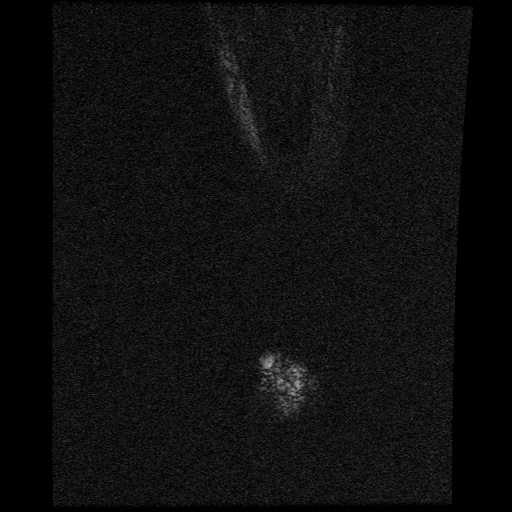

[Series 21: T1 · sagittal · 4.0mm · 0.29mm/px · 2 of 28 slices shown (6 of 6)]
[im 1/28]
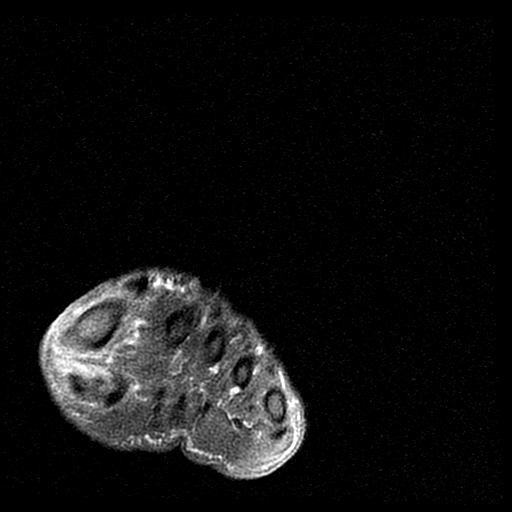
[im 28/28]
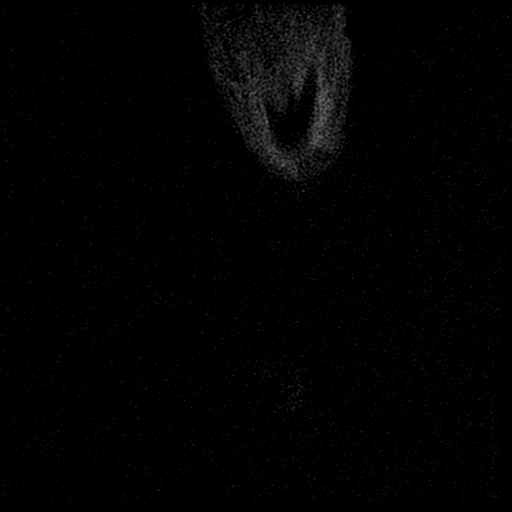

[Series 22: T1 fat-sat · axial · 5.0mm · 0.29mm/px · 1 of 27 slices shown (3 of 3)]
[im 1/27]
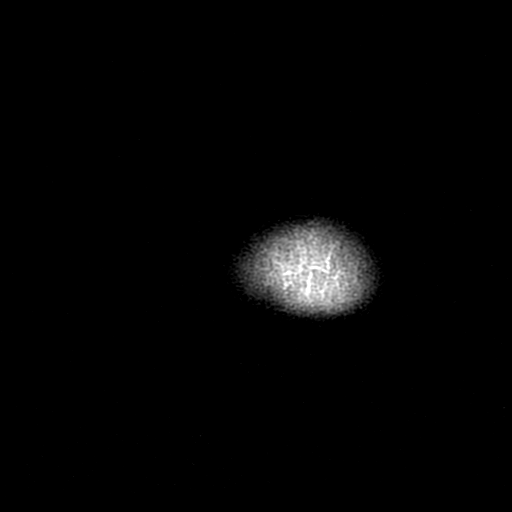

[22 of 40 positions shown; findings below may reference images not displayed]

FINDINGS: Patient motion degrades image quality limiting evaluation.

TENDONS

Peroneal: Peroneal longus tendon intact. Peroneal brevis intact.

Posteromedial: Posterior tibial tendon intact. Flexor hallucis
longus tendon intact. Flexor digitorum longus tendon intact.

Anterior: Tibialis anterior tendon intact. Extensor hallucis longus
tendon intact Extensor digitorum longus tendon intact.

Achilles: Soft tissue wound overlying the muscular tendinous
junction of the proximal Achilles tendon with increased signal and
enhancement along the lateral aspect concerning for tendinosis
likely secondary to an infectious etiology.

Plantar Fascia: Intact.

LIGAMENTS

Lateral: Anterior talofibular ligament not well-visualized.
Calcaneofibular ligament intact. Posterior talofibular ligament
intact. Anterior and posterior tibiofibular ligaments intact.

Medial: Deltoid ligament intact. Spring ligament intact.

CARTILAGE

Ankle Joint: No joint effusion. Normal ankle mortise. No chondral
defect.

Subtalar Joints/Sinus Tarsi: Normal subtalar joints. No subtalar
joint effusion. Normal sinus tarsi.

Bones: No marrow signal abnormality.  No fracture or dislocation.

Soft Tissue: 1 x 3.5 x 2.8 cm peripherally enhancing fluid
collection along the posterior-lateral aspect of the ankle most
consistent with an abscess. Severe soft tissue edema and enhancement
adjacent to the abscess. Severe edema in the distal soleus muscle at
the musculotendinous junction and to a lesser extent distal medial
and lateral gastrocnemius muscles with enhancement and an overlying
soft tissue wound most concerning for infectious myositis. 13 x 13 x
19 mm peripherally enhancing abnormality in the subcutaneous fat
overlying the medial posterior calcaneus which may reflect an area
of fat necrosis versus an abscess.
IMPRESSION: 1. Soft tissue wound along the distal calf. Soft tissue wound
overlying the muscular tendinous junction of the proximal Achilles
tendon with increased signal and enhancement along the lateral
aspect concerning for tendinosis likely secondary to an infectious
etiology.
2. 1 x 3.5 x 2.8 cm peripherally enhancing fluid collection along
the posterior-lateral aspect of the ankle most consistent with an
abscess. Severe soft tissue edema and enhancement adjacent to the
abscess consistent with cellulitis.
3. Severe edema in the distal soleus muscle at the musculotendinous
junction and to a lesser extent distal medial and lateral
gastrocnemius muscles with enhancement and an overlying soft tissue
wound most concerning for infectious myositis.
4. 13 x 13 x 19 mm peripherally enhancing abnormality in the
subcutaneous fat overlying the medial posterior calcaneus which may
reflect an area of fat necrosis versus an abscess.

## 2018-05-29 IMAGING — DX DG FOOT COMPLETE 3+V*R*
3 series · 3 of 3 positions shown · non-contrast
Comparison: None.

CLINICAL DATA: Right foot pain in wounds.

EXAM:
RIGHT FOOT COMPLETE - 3+ VIEW

[foot ap]
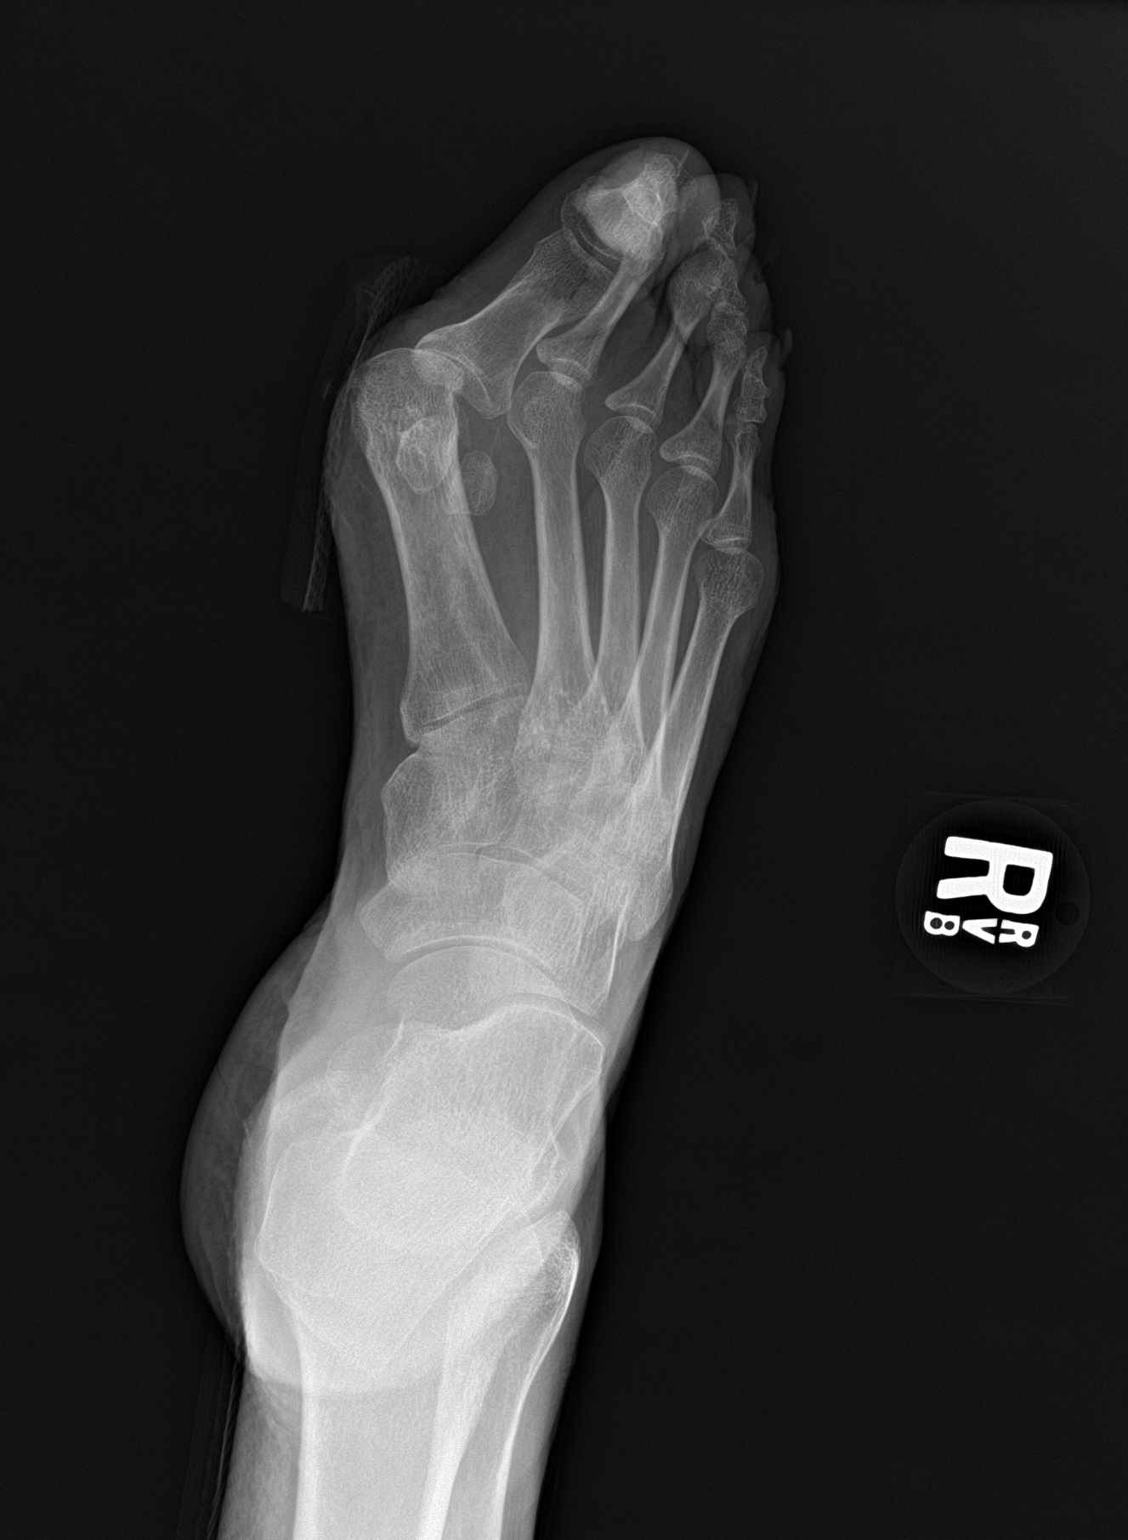

[foot obl]
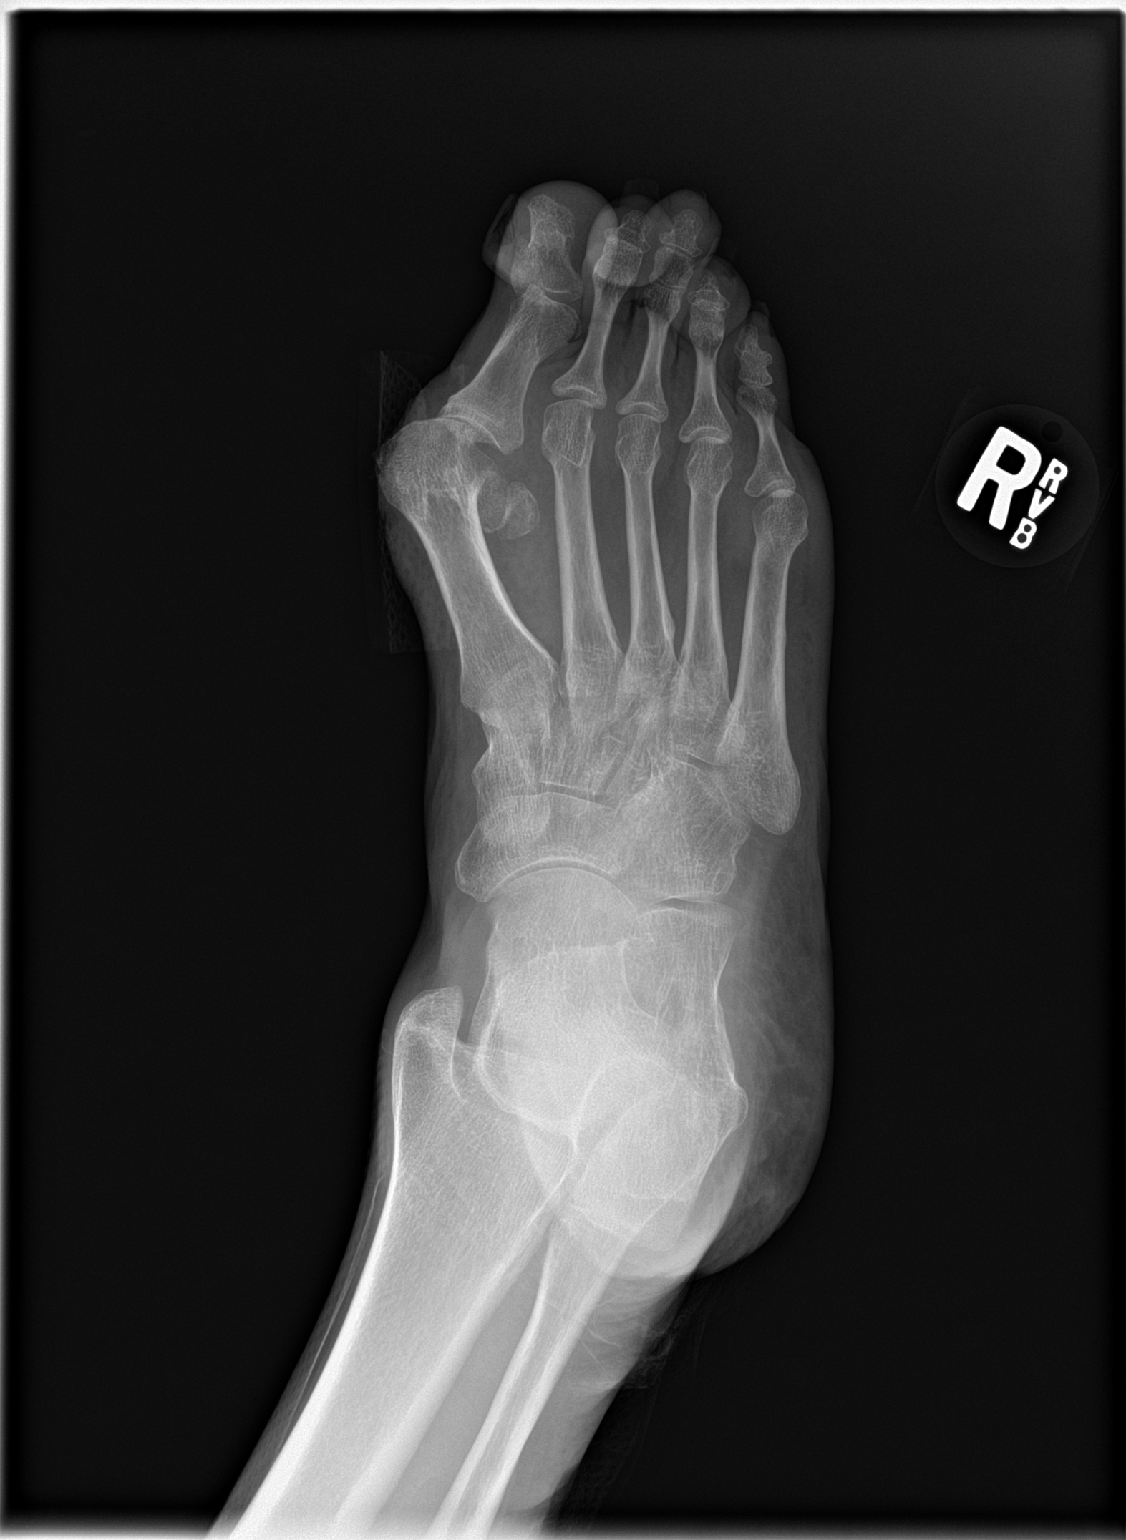

[foot lat]
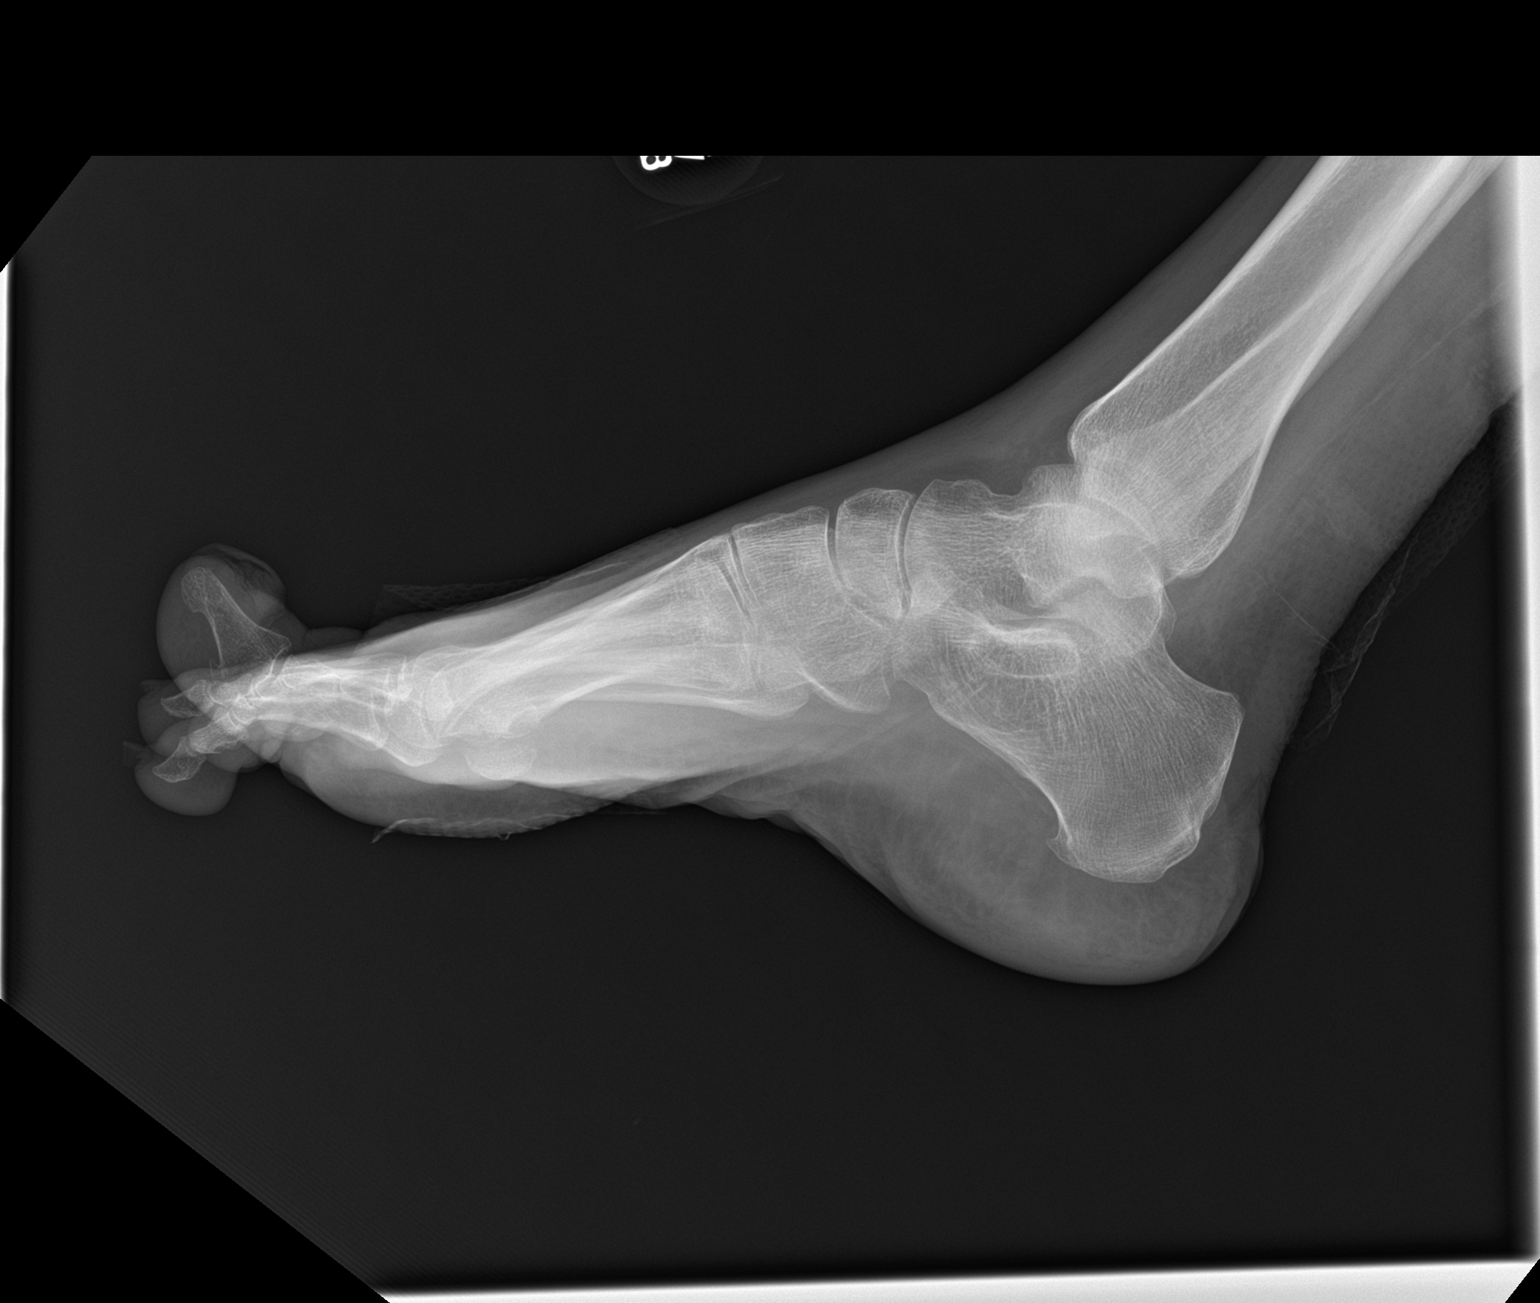

[3 of 3 positions shown; findings below may reference images not displayed]

FINDINGS: No definite fracture is noted. Severe hallux valgus deformity of
first metatarsophalangeal joint is noted with subluxation laterally
of the first proximal phalanx relative to first metatarsal. There
appears to be an open wound overlying the distal portion of the
first metatarsal with some lucency in the bone in this area
suggesting possible osteomyelitis. No radiopaque foreign body is
noted.
IMPRESSION: Severe hallux valgus deformity of first metatarsophalangeal joint
with subluxation laterally of first proximal phalanx relative to
first metatarsal. Open wound is seen overlying the distal portion of
the first metatarsal with some lucency seen in the bone in this area
suggesting possible osteomyelitis. MRI may be performed further
evaluation.

## 2018-05-30 ENCOUNTER — Ambulatory Visit (INDEPENDENT_AMBULATORY_CARE_PROVIDER_SITE_OTHER): Payer: Medicaid Other | Admitting: Family Medicine

## 2018-05-30 ENCOUNTER — Encounter: Payer: Self-pay | Admitting: Family Medicine

## 2018-05-30 VITALS — BP 136/82 | HR 82 | Temp 97.9°F | Ht 61.0 in

## 2018-05-30 DIAGNOSIS — F418 Other specified anxiety disorders: Secondary | ICD-10-CM

## 2018-05-30 DIAGNOSIS — Z89512 Acquired absence of left leg below knee: Secondary | ICD-10-CM | POA: Diagnosis not present

## 2018-05-30 DIAGNOSIS — I10 Essential (primary) hypertension: Secondary | ICD-10-CM

## 2018-05-30 DIAGNOSIS — Z131 Encounter for screening for diabetes mellitus: Secondary | ICD-10-CM | POA: Diagnosis not present

## 2018-05-30 DIAGNOSIS — Z720 Tobacco use: Secondary | ICD-10-CM | POA: Diagnosis not present

## 2018-05-30 DIAGNOSIS — Z09 Encounter for follow-up examination after completed treatment for conditions other than malignant neoplasm: Secondary | ICD-10-CM

## 2018-05-30 DIAGNOSIS — Z89511 Acquired absence of right leg below knee: Secondary | ICD-10-CM

## 2018-05-30 LAB — POCT GLYCOSYLATED HEMOGLOBIN (HGB A1C): Hemoglobin A1C: 5 % (ref 4.0–5.6)

## 2018-05-30 NOTE — Progress Notes (Signed)
Follow Up  Subjective:    [name], female    DOB: Jun 15, 1961, 57 y.o.   MRN: [id number]  Chief Complaint  Patient presents with  . Hospitalization Follow-up   HPI  [name] is a 68 year of female with a history of Vasculitis, Rheumatoid Arthritis, Anemia, Migraines, Inflammatory Arthritis, Hypertension, Depression, CAP, and Cocaine Abuse. She is here today for Hospital Follow Up.   Current Status: Since her last office visit, she has been admitted to hospital on 05/19/2018-05/23/2018 for Chest Pain. She's doing well today. She does not have any complaints today. She did have a healing wound from IV med, in right upper arm area. She denies pain, discomfort, redness, warmth, or discharge from wound. She has bilateral above the knee leg amputations. She arrives via wheelchair today.   She denies fevers, chills, fatigue, recent infections, weight loss, and night sweats. She has not had any headaches, visual changes, dizziness, and falls. No chest pain, heart palpitations, cough and shortness of breath reported. No reports of GI problems such as nausea, vomiting, diarrhea, and constipation. She has no reports of blood in stools, dysuria and hematuria. No depression or anxiety reported. She denies pain today.   Past Medical History:  Diagnosis Date  . CAP (community acquired pneumonia) 03/2014 X 2  . Cocaine abuse ([place])    ongoing with resultant vaculitis.  . Depression   . Headache    "weekly" (07/29/2016)  . Hypertension   . Inflammatory arthritis   . Migraines    "probably 5-6/yr" (07/29/2016)  . Normocytic anemia    BL Hgb 9.8-12. Last anemia panel 04/2010 - showing Fe 19, ferritin 101.  Pt on monthly B12 injections  . Rheumatoid arthritis(714.0)    patient reported  . 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 Followed by Dr. Louanne Skye    Family History  Problem Relation Age of Onset  . Breast cancer Mother         Breast cancer  . Alcohol abuse Mother   . Colon cancer Maternal Aunt 64  . Alcohol abuse Father     Social History   Socioeconomic History  . Marital status: Single    Spouse name: Not on file  . Number of children: 0  . Years of education: 11th grade  . Highest education level: Not on file  Occupational History  . Occupation: Disability    Comment: since 2011, due to her rheumatoid arthritis  Social Needs  . Financial resource strain: Not very hard  . Food insecurity:    Worry: Sometimes true    Inability: Sometimes true  . Transportation needs:    Medical: Yes    Non-medical: Yes  Tobacco Use  . Smoking status: Current Every Day Smoker    Packs/day: 0.12    Years: 38.00    Pack years: 4.56    Types: Cigarettes  . Smokeless tobacco: Never Used  . Tobacco comment: 2 A DAY  Substance and Sexual Activity  . Alcohol use: No    Alcohol/week: 0.0 standard drinks  . Drug use: Yes    Types: "Crack" cocaine, Cocaine    Comment: Smoked crack 06/02/2017  . Sexual activity: Not Currently  Lifestyle  . Physical activity:    Days per week: 7 days    Minutes per session: Not on file  . Stress: Very much  Relationships  . Social connections:    Talks on phone: More  than three times a week    Gets together: More than three times a week    Attends religious service: Never    Active member of club or organization: No    Attends meetings of clubs or organizations: Never    Relationship status: Never married  . Intimate partner violence:    Fear of current or ex partner: Patient refused    Emotionally abused: Patient refused    Physically abused: Patient refused    Forced sexual activity: Patient refused  Other Topics Concern  . Not on file  Social History Narrative   Unemployed:  cleaning in past   Living at [place]; has Medicaid.   crack/cocaine use; pt denies IVDU   tobacco:  1/2 ppd, trying to quit   alcohol:  none        Past Surgical History:  Procedure  Laterality Date  . AMPUTATION Left 05/22/2016   Procedure: AMPUTATION LEFT LONG FINGER;  Surgeon: Marybelle Killings, MD;  Location: [facility];  Service: Orthopedics;  Laterality: Left;  . AMPUTATION Bilateral 04/10/2017   Procedure: AMPUTATION BELOW KNEE;  Surgeon: Newt Minion, MD;  Location: [facility];  Service: Orthopedics;  Laterality: Bilateral;  . AMPUTATION Bilateral 02/06/2018   Procedure: AMPUTATION ABOVE KNEE;  Surgeon: Newt Minion, MD;  Location: [facility];  Service: Orthopedics;  Laterality: Bilateral;  . HERNIA REPAIR     "stomach"  . I&D EXTREMITY Right 09/26/2015   Procedure: IRRIGATION AND DEBRIDEMENT LEG WOUND  VAC PLACEMENT.;  Surgeon: Loel Lofty Dillingham, DO;  Location: [facility];  Service: Plastics;  Laterality: Right;  . INCISION AND DRAINAGE OF WOUND Bilateral 10/20/2016   Procedure: IRRIGATION AND DEBRIDEMENT WOUND BILATERAL;  Surgeon: Edrick Kins, DPM;  Location: [facility];  Service: Podiatry;  Laterality: Bilateral;  . IRRIGATION AND DEBRIDEMENT ABSCESS Bilateral 09/26/2013   Procedure: DEBRIDEMENT ULCERS BILATERAL THIGHS;  Surgeon: Gwenyth Ober, MD;  Location: [facility];  Service: General;  Laterality: Bilateral;  . SKIN BIOPSY Bilateral    shin nodules    Immunization History  Administered Date(s) Administered  . Influenza Whole 09/15/2010  . Influenza,inj,Quad PF,6+ Mos 10/11/2014, 10/24/2017  . Influenza-Unspecified 07/06/2011  . Pneumococcal Polysaccharide-23 09/15/2010, 10/11/2014  . Td 09/15/2010  . Tdap 10/11/2013    No outpatient medications have been marked as taking for the 05/30/18 encounter (Office Visit) with Azzie Glatter, FNP.    Allergies  Allergen Reactions  . Acetaminophen Swelling and Other (See Comments)    Reaction:  Eyelid swelling  . Lisinopril     UNSPECIFIED REACTION     BP 136/82 (BP Location: Left Arm, Patient Position: Sitting, Cuff Size: Small)   Pulse 82   Temp 97.9 F (36.6 C) (Oral)   Ht 5' 1"  (1.549 m)   SpO2 97%   BMI 14.12 kg/m    Review of Systems  Constitutional: Negative.   HENT: Negative.   Eyes: Negative.   Respiratory: Positive for cough (Occasional).   Cardiovascular: Negative.   Gastrointestinal: Negative.   Endocrine: Negative.   Genitourinary: Negative.   Musculoskeletal: Negative.        Bilateral above knee leg amputations.   Skin: Negative.   Allergic/Immunologic: Negative.   Neurological: Negative.   Hematological: Negative.   Psychiatric/Behavioral: Negative.    Objective:   Physical Exam  Constitutional: She is oriented to person, place, and time. She appears well-developed and well-nourished.  HENT:  Head: Normocephalic and atraumatic.  Right Ear: External ear normal.  Left Ear: External ear normal.  Nose: Nose normal.  Mouth/Throat: Oropharynx is clear and moist.  Eyes: Pupils are equal, round, and reactive to light. Conjunctivae and EOM are normal.  Neck: Normal range of motion. Neck supple.  Cardiovascular: Normal rate, regular rhythm, normal heart sounds and intact distal pulses.  Pulmonary/Chest: Effort normal and breath sounds normal.  Abdominal: Soft. Bowel sounds are normal.  Musculoskeletal:  S/p: bilateral leg amputation 11/2017  Neurological: She is alert and oriented to person, place, and time.  Skin: Skin is warm and dry. Capillary refill takes less than 2 seconds.  Psychiatric: She has a normal mood and affect. Her behavior is normal. Judgment and thought content normal.  Nursing note and vitals reviewed.  Assessment & Plan:   1. Essential hypertension Norvasc is effective. Blood pressure is 136/82 today. Continue med as prescribed.   2. Tobacco abuse She continues to smoke 2-3 cigarettes daily. She has Rx for Nicoderm Patch.   3. Depression with anxiety Stable. Anxiety is stable today.   4. S/P bilateral below knee amputation (HCC) Stable. No c/o pain today.   5. Screening for diabetes mellitus Hgb A1c is normal at 5.0 today. She will continue to decrease  foods/beverages high in sugars and carbs and follow Heart Healthy or DASH diet. Increase physical activity to at least 30 minutes cardio exercise daily.   - POCT glycosylated hemoglobin (Hb A1C)  6. Follow up He will follow up in 2 months.  No orders of the defined types were placed in this encounter.  Kathe Becton,  MSN, FNP-C Patient [facility] [address] [place], [place] [address] [phone]

## 2018-06-20 ENCOUNTER — Inpatient Hospital Stay (HOSPITAL_COMMUNITY)
Admission: EM | Admit: 2018-06-20 | Discharge: 2018-06-23 | DRG: 292 | Disposition: A | Discharge status: Home / Self Care | Payer: Medicaid Other | Attending: Family Medicine | Admitting: Family Medicine

## 2018-06-20 ENCOUNTER — Other Ambulatory Visit: Payer: Self-pay

## 2018-06-20 ENCOUNTER — Emergency Department (HOSPITAL_COMMUNITY): Payer: Medicaid Other

## 2018-06-20 ENCOUNTER — Encounter (HOSPITAL_COMMUNITY): Payer: Self-pay

## 2018-06-20 DIAGNOSIS — F329 Major depressive disorder, single episode, unspecified: Secondary | ICD-10-CM | POA: Diagnosis present

## 2018-06-20 DIAGNOSIS — D649 Anemia, unspecified: Secondary | ICD-10-CM | POA: Diagnosis present

## 2018-06-20 DIAGNOSIS — F1290 Cannabis use, unspecified, uncomplicated: Secondary | ICD-10-CM | POA: Diagnosis present

## 2018-06-20 DIAGNOSIS — M069 Rheumatoid arthritis, unspecified: Secondary | ICD-10-CM | POA: Diagnosis present

## 2018-06-20 DIAGNOSIS — Z89022 Acquired absence of left finger(s): Secondary | ICD-10-CM

## 2018-06-20 DIAGNOSIS — Z89511 Acquired absence of right leg below knee: Secondary | ICD-10-CM

## 2018-06-20 DIAGNOSIS — F1410 Cocaine abuse, uncomplicated: Secondary | ICD-10-CM

## 2018-06-20 DIAGNOSIS — F17200 Nicotine dependence, unspecified, uncomplicated: Secondary | ICD-10-CM | POA: Diagnosis present

## 2018-06-20 DIAGNOSIS — I10 Essential (primary) hypertension: Secondary | ICD-10-CM | POA: Diagnosis present

## 2018-06-20 DIAGNOSIS — R112 Nausea with vomiting, unspecified: Secondary | ICD-10-CM

## 2018-06-20 DIAGNOSIS — Z89612 Acquired absence of left leg above knee: Secondary | ICD-10-CM

## 2018-06-20 DIAGNOSIS — I509 Heart failure, unspecified: Secondary | ICD-10-CM

## 2018-06-20 DIAGNOSIS — Z803 Family history of malignant neoplasm of breast: Secondary | ICD-10-CM

## 2018-06-20 DIAGNOSIS — R197 Diarrhea, unspecified: Secondary | ICD-10-CM

## 2018-06-20 DIAGNOSIS — I160 Hypertensive urgency: Secondary | ICD-10-CM | POA: Diagnosis present

## 2018-06-20 DIAGNOSIS — F1420 Cocaine dependence, uncomplicated: Secondary | ICD-10-CM | POA: Diagnosis present

## 2018-06-20 DIAGNOSIS — Z800 Family history of malignant neoplasm of digestive organs: Secondary | ICD-10-CM

## 2018-06-20 DIAGNOSIS — E876 Hypokalemia: Secondary | ICD-10-CM | POA: Diagnosis present

## 2018-06-20 DIAGNOSIS — I272 Pulmonary hypertension, unspecified: Secondary | ICD-10-CM | POA: Diagnosis present

## 2018-06-20 DIAGNOSIS — Z811 Family history of alcohol abuse and dependence: Secondary | ICD-10-CM

## 2018-06-20 DIAGNOSIS — I5033 Acute on chronic diastolic (congestive) heart failure: Secondary | ICD-10-CM | POA: Diagnosis present

## 2018-06-20 DIAGNOSIS — Z89512 Acquired absence of left leg below knee: Secondary | ICD-10-CM

## 2018-06-20 DIAGNOSIS — I110 Hypertensive heart disease with heart failure: Principal | ICD-10-CM | POA: Diagnosis present

## 2018-06-20 DIAGNOSIS — Z9114 Patient's other noncompliance with medication regimen: Secondary | ICD-10-CM

## 2018-06-20 DIAGNOSIS — Z888 Allergy status to other drugs, medicaments and biological substances status: Secondary | ICD-10-CM

## 2018-06-20 DIAGNOSIS — R21 Rash and other nonspecific skin eruption: Secondary | ICD-10-CM

## 2018-06-20 DIAGNOSIS — B356 Tinea cruris: Secondary | ICD-10-CM | POA: Diagnosis present

## 2018-06-20 DIAGNOSIS — Z89611 Acquired absence of right leg above knee: Secondary | ICD-10-CM

## 2018-06-20 LAB — URINALYSIS, ROUTINE W REFLEX MICROSCOPIC
Bacteria, UA: NONE SEEN
Bilirubin Urine: NEGATIVE
Glucose, UA: NEGATIVE mg/dL
Ketones, ur: NEGATIVE mg/dL
Leukocytes, UA: NEGATIVE
Nitrite: NEGATIVE
Protein, ur: 30 mg/dL — AB
Specific Gravity, Urine: 1.016 (ref 1.005–1.030)
pH: 6 (ref 5.0–8.0)

## 2018-06-20 LAB — COMPREHENSIVE METABOLIC PANEL
ALT: 21 U/L (ref 0–44)
AST: 27 U/L (ref 15–41)
Albumin: 3.2 g/dL — ABNORMAL LOW (ref 3.5–5.0)
Alkaline Phosphatase: 108 U/L (ref 38–126)
Anion gap: 11 (ref 5–15)
BUN: 16 mg/dL (ref 6–20)
CO2: 19 mmol/L — ABNORMAL LOW (ref 22–32)
Calcium: 8.5 mg/dL — ABNORMAL LOW (ref 8.9–10.3)
Chloride: 106 mmol/L (ref 98–111)
Creatinine, Ser: 0.83 mg/dL (ref 0.44–1.00)
GFR calc Af Amer: >60 mL/min (ref >60)
GFR calc non Af Amer: >60 mL/min (ref >60)
Glucose, Bld: 117 mg/dL — ABNORMAL HIGH (ref 70–99)
Potassium: 3.4 mmol/L — ABNORMAL LOW (ref 3.5–5.1)
Sodium: 136 mmol/L (ref 135–145)
Total Bilirubin: 1.5 mg/dL — ABNORMAL HIGH (ref 0.3–1.2)
Total Protein: 6.3 g/dL — ABNORMAL LOW (ref 6.5–8.1)

## 2018-06-20 LAB — BRAIN NATRIURETIC PEPTIDE: B Natriuretic Peptide: 3612.1 pg/mL — ABNORMAL HIGH (ref 0.0–100.0)

## 2018-06-20 LAB — CBC WITH DIFFERENTIAL/PLATELET
Basophils Absolute: 0 10*3/uL (ref 0.0–0.1)
Basophils Relative: 0 %
Eosinophils Absolute: 0 10*3/uL (ref 0.0–0.7)
Eosinophils Relative: 1 %
HCT: 34.8 % — ABNORMAL LOW (ref 36.0–46.0)
Hemoglobin: 11.1 g/dL — ABNORMAL LOW (ref 12.0–15.0)
Lymphocytes Relative: 28 %
Lymphs Abs: 1.8 10*3/uL (ref 0.7–4.0)
MCH: 26.8 pg (ref 26.0–34.0)
MCHC: 31.9 g/dL (ref 30.0–36.0)
MCV: 84.1 fL (ref 78.0–100.0)
Monocytes Absolute: 0.6 10*3/uL (ref 0.1–1.0)
Monocytes Relative: 9 %
Neutro Abs: 4 10*3/uL (ref 1.7–7.7)
Neutrophils Relative %: 62 %
Platelets: 245 10*3/uL (ref 150–400)
RBC: 4.14 MIL/uL (ref 3.87–5.11)
RDW: 17.7 % — ABNORMAL HIGH (ref 11.5–15.5)
WBC: 6.5 10*3/uL (ref 4.0–10.5)

## 2018-06-20 LAB — I-STAT TROPONIN, ED: TROPONIN I, POC: 0.02 ng/mL (ref 0.00–0.08)

## 2018-06-20 LAB — LIPASE, BLOOD: Lipase: 30 U/L (ref 11–51)

## 2018-06-20 MED ORDER — FUROSEMIDE 40 MG PO TABS
40.0000 mg | ORAL_TABLET | Freq: Once | ORAL | Status: AC
  Administered 2018-06-21: 40 mg via ORAL
  Filled 2018-06-20: qty 1

## 2018-06-20 MED ORDER — AMLODIPINE BESYLATE 5 MG PO TABS
10.0000 mg | ORAL_TABLET | Freq: Once | ORAL | Status: AC
  Administered 2018-06-20: 10 mg via ORAL
  Filled 2018-06-20: qty 2

## 2018-06-20 MED ORDER — SODIUM CHLORIDE 0.9 % IV BOLUS
500.0000 mL | Freq: Once | INTRAVENOUS | Status: AC
  Administered 2018-06-20: 500 mL via INTRAVENOUS

## 2018-06-20 MED ORDER — ONDANSETRON HCL 4 MG/2ML IJ SOLN
4.0000 mg | Freq: Once | INTRAMUSCULAR | Status: AC
  Administered 2018-06-20: 4 mg via INTRAVENOUS
  Filled 2018-06-20: qty 2

## 2018-06-20 MED ORDER — NYSTATIN 100000 UNIT/GM EX POWD
Freq: Once | CUTANEOUS | Status: AC
  Administered 2018-06-20: 23:00:00 via TOPICAL
  Filled 2018-06-20: qty 15

## 2018-06-20 NOTE — ED Provider Notes (Signed)
[facility] DEPT Provider Note   CSN: [id number] Arrival date & time: 06/20/18  2126     History   Chief Complaint Chief Complaint  Patient presents with  . Nausea  . Emesis  . Rash    Vaginal     HPI [name] is a 57 y.o. female with history of cocaine abuse, bilateral AKA from [place] induced vasculitis, hypertension, rheumatoid arthritis, and migraines presents for evaluation of acute onset, progressively worsening nausea, vomiting, diarrhea for 4 days.  She notes on average 3 episodes of nonbloody nonbilious emesis and 2-4 episodes of watery nonbloody diarrhea daily.  Denies abdominal pain.  She does note cough for several days which is nonproductive as well as shortness of breath and aching anterior chest wall pain with cough. Shortness of breath worsens with exertion.  She is a current smoker.  Notes marijuana use but denies recent cocaine use.  Denies fevers.  States she has been out of her medicines for the past several days and has not had any of her hypertension medicines.  Denies recent travel, recent treatment with antibiotics, or suspicious food intake.  Has not tried anything for her symptoms.  She also notes rash to the left groin for an unknown amount of time.  She notes it is constantly itching and burning.  Denies any new soaps, shampoos, detergents, or lotions.  She does note that it is malodorous.  Denies any vaginal itching, bleeding, discharge out of the ordinary.  Has not tried anything for the rash   The history is provided by the patient.    Past Medical History:  Diagnosis Date  . CAP (community acquired pneumonia) 03/2014 X 2  . Cocaine abuse ([place])    ongoing with resultant vaculitis.  . Depression   . Headache    "weekly" (07/29/2016)  . Hypertension   . Inflammatory arthritis   . Migraines    "probably 5-6/yr" (07/29/2016)  . Normocytic anemia    BL Hgb 9.8-12. Last anemia panel 04/2010 - showing Fe 19,  ferritin 101.  Pt on monthly B12 injections  . Rheumatoid arthritis(714.0)    patient reported  . 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 Followed by Dr. Louanne Skye    Patient Active Problem List   Diagnosis Date Noted  . Malnutrition of moderate degree 05/20/2018  . Acute congestive heart failure ([place])   . Suicidal ideation 02/02/2018  . HCAP (healthcare-associated pneumonia) 02/02/2018  . Acute on chronic respiratory failure with hypoxia ([place]) 02/02/2018  . Sepsis ([place]) 02/02/2018  . Ulcer of amputation stump of lower extremity ([place]) 10/23/2017  . 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 Hypertensive crisis   . Phantom limb pain ([place])   . S/P bilateral below knee amputation ([place]) 04/13/2017  . Tobacco abuse   . Post-operative pain   . Acute blood loss anemia   . Atherosclerosis of native arteries of extremities with gangrene, bilateral legs ([place])   . Wound infection 03/30/2017  . AKI (acute kidney injury) ([place]) 02/07/2017  . Acute kidney injury ([place]) 02/06/2017  . Wound healing, delayed   . Chest pain 04/07/2015  . Protein-calorie malnutrition, severe ([place]) 01/15/2015  . MDD (major depressive disorder), recurrent episode, severe ([place]) 10/16/2014  . Cocaine use disorder, severe, dependence ([place]) 10/10/2014  . 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 Essential hypertension 02/26/2010    Past Surgical History:  Procedure Laterality Date  .  AMPUTATION Left 05/22/2016   Procedure: AMPUTATION LEFT LONG FINGER;  Surgeon: Marybelle Killings, MD;  Location: [facility];  Service: Orthopedics;  Laterality: Left;  . AMPUTATION Bilateral 04/10/2017   Procedure: AMPUTATION BELOW KNEE;  Surgeon: Newt Minion, MD;  Location: [facility];  Service: Orthopedics;  Laterality: Bilateral;  . AMPUTATION Bilateral 02/06/2018   Procedure: AMPUTATION ABOVE KNEE;  Surgeon: Newt Minion, MD;  Location: [facility];  Service:  Orthopedics;  Laterality: Bilateral;  . HERNIA REPAIR     "stomach"  . I&D EXTREMITY Right 09/26/2015   Procedure: IRRIGATION AND DEBRIDEMENT LEG WOUND  VAC PLACEMENT.;  Surgeon: Loel Lofty Dillingham, DO;  Location: [facility];  Service: Plastics;  Laterality: Right;  . INCISION AND DRAINAGE OF WOUND Bilateral 10/20/2016   Procedure: IRRIGATION AND DEBRIDEMENT WOUND BILATERAL;  Surgeon: Edrick Kins, DPM;  Location: [facility];  Service: Podiatry;  Laterality: Bilateral;  . IRRIGATION AND DEBRIDEMENT ABSCESS Bilateral 09/26/2013   Procedure: DEBRIDEMENT ULCERS BILATERAL THIGHS;  Surgeon: Gwenyth Ober, MD;  Location: [facility];  Service: General;  Laterality: Bilateral;  . SKIN BIOPSY Bilateral    shin nodules     OB History   None      Home Medications    Prior to Admission medications   Medication Sig Start Date End Date Taking? Authorizing Provider  amLODipine (NORVASC) 10 MG tablet Take 1 tablet (10 mg total) by mouth daily. Patient not taking: Reported on 05/30/2018 05/24/18   Edwin Dada, MD  furosemide (LASIX) 40 MG tablet Take 1 tablet (40 mg total) by mouth daily. Patient not taking: Reported on 05/30/2018 05/24/18   Edwin Dada, MD  nicotine (NICODERM CQ - DOSED IN MG/24 HR) 7 mg/24hr patch Place 1 patch (7 mg total) onto the skin daily. Patient not taking: Reported on 05/30/2018 05/23/18   Edwin Dada, MD  potassium chloride (K-DUR) 10 MEQ tablet Take 1 tablet (10 mEq total) by mouth daily. Patient not taking: Reported on 05/30/2018 05/23/18   Edwin Dada, MD    Family History Family History  Problem Relation Age of Onset  . Breast cancer Mother        Breast cancer  . Alcohol abuse Mother   . Colon cancer Maternal Aunt 42  . Alcohol abuse Father     Social History Social History   Tobacco Use  . Smoking status: Current Every Day Smoker    Packs/day: 0.12    Years: 38.00    Pack years: 4.56    Types: Cigarettes  . Smokeless tobacco:  Never Used  . Tobacco comment: 2 A DAY  Substance Use Topics  . Alcohol use: No    Alcohol/week: 0.0 standard drinks  . Drug use: Yes    Types: "Crack" cocaine, Cocaine    Comment: Smoked crack 06/02/2017     Allergies   Acetaminophen and Lisinopril   Review of Systems Review of Systems  Constitutional: Negative for chills and fever.  Respiratory: Positive for cough and shortness of breath.   Cardiovascular: Positive for chest pain.  Gastrointestinal: Positive for diarrhea, nausea and vomiting. Negative for abdominal pain.  Genitourinary: Negative for dysuria, hematuria, urgency, vaginal bleeding, vaginal discharge and vaginal pain.  Skin: Positive for rash.  All other systems reviewed and are negative.    Physical Exam Updated Vital Signs BP (!) 181/112 (BP Location: Right Arm)   Pulse 98   Temp (!) 97.5 F (36.4 C) (Oral)   Resp 18  Ht 5' 1"  (1.549 m)   SpO2 99%   BMI 14.12 kg/m   Physical Exam  Constitutional: She appears well-developed and well-nourished. No distress.  HENT:  Head: Normocephalic and atraumatic.  Eyes: Conjunctivae are normal. Right eye exhibits no discharge. Left eye exhibits no discharge.  Neck: No JVD present. No tracheal deviation present.  Cardiovascular: Normal rate, regular rhythm, normal heart sounds and intact distal pulses.  Pulmonary/Chest: Effort normal.  Scattered rhonchi and wheezes.  Abdominal: Soft. Bowel sounds are normal. She exhibits distension. There is tenderness in the right upper quadrant, right lower quadrant, epigastric area, suprapubic area and left lower quadrant. There is guarding. There is no rigidity, no rebound and no CVA tenderness.  Musculoskeletal: She exhibits no edema.  Bilateral AKA  Neurological: She is alert.  Skin: Skin is warm and dry. Rash noted. No erythema.  See below image.  Patient has a rash to the left groin with satellite lesions, yeasty odor.   Psychiatric: She has a normal mood and affect.  Her behavior is normal.  Nursing note and vitals reviewed.      ED Treatments / Results  Labs (all labs ordered are listed, but only abnormal results are displayed) Labs Reviewed  CBC WITH DIFFERENTIAL/PLATELET - Abnormal; Notable for the following components:      Result Value   Hemoglobin 11.1 (*)    HCT 34.8 (*)    RDW 17.7 (*)    All other components within normal limits  COMPREHENSIVE METABOLIC PANEL - Abnormal; Notable for the following components:   Potassium 3.4 (*)    CO2 19 (*)    Glucose, Bld 117 (*)    Calcium 8.5 (*)    Total Protein 6.3 (*)    Albumin 3.2 (*)    Total Bilirubin 1.5 (*)    All other components within normal limits  URINALYSIS, ROUTINE W REFLEX MICROSCOPIC - Abnormal; Notable for the following components:   Hgb urine dipstick SMALL (*)    Protein, ur 30 (*)    All other components within normal limits  BRAIN NATRIURETIC PEPTIDE - Abnormal; Notable for the following components:   B Natriuretic Peptide 3,612.1 (*)    All other components within normal limits  LIPASE, BLOOD  RAPID URINE DRUG SCREEN, HOSP PERFORMED  I-STAT TROPONIN, ED    EKG EKG Interpretation  Date/Time:  Monday June 20 2018 22:57:27 EDT Ventricular Rate:  99 PR Interval:  132 QRS Duration: 84 QT Interval:  372 QTC Calculation: 477 R Axis:   92 Text Interpretation:  Normal sinus rhythm Possible Left atrial enlargement Rightward axis Cannot rule out Anterior infarct , age undetermined Abnormal ECG No significant change since last tracing Confirmed by Pryor Curia [phone]) on 06/20/2018 11:06:44 PM   Radiology Dg Chest 2 View  Result Date: 06/20/2018 CLINICAL DATA:  Cough and dyspnea. Nausea, vomiting and weakness x4 days. EXAM: CHEST - 2 VIEW COMPARISON:  05/19/2018 FINDINGS: There is stable cardiomegaly with aortic atherosclerosis and posterior right moderate pleural effusion. Mild interstitial edema is identified with low lung volumes. No alveolar consolidation. No  acute osseous abnormality. IMPRESSION: Stable cardiomegaly with aortic atherosclerosis. Mild interstitial edema and stable posterior right pleural effusion. Electronically Signed   By: Ashley Royalty M.D.   On: 06/20/2018 22:49    Procedures Procedures (including critical care time)  Medications Ordered in ED Medications  nystatin (MYCOSTATIN/NYSTOP) topical powder ( Topical Given 06/20/18 2313)  ondansetron (ZOFRAN) injection 4 mg (4 mg Intravenous Given 06/20/18 2308)  sodium  chloride 0.9 % bolus 500 mL (0 mLs Intravenous Stopped 06/21/18 0050)  amLODipine (NORVASC) tablet 10 mg (10 mg Oral Given 06/20/18 2308)  furosemide (LASIX) tablet 40 mg (40 mg Oral Given 06/21/18 0038)  potassium chloride SA (K-DUR,KLOR-CON) CR tablet 40 mEq (40 mEq Oral Given 06/21/18 0038)     Initial Impression / Assessment and Plan / ED Course  I have reviewed the triage vital signs and the nursing notes.  Pertinent labs & imaging results that were available during my care of the patient were reviewed by me and considered in my medical decision making (see chart for details).     Patient with complaint of cough, shortness of breath, chest pain, nausea, vomiting, and diarrhea.  She is afebrile, hypertensive in the ED.  DOE at home. She initially told me that she ran out of her medicines 3 days ago but told my attending physician that she has not had her medicine since her admission to the hospital on 05/19/2018.  Rash consistent with intertrigo, recommend nystatin cream.  Work-up significant for chest x-ray showing right pleural effusion and cardiomegaly with interstitial edema.  BNP >3600. She has no leukocytosis.  Stable anemia.  She has hypokalemia 3.4, replenished in the ED.  UA is not concerning for UTI.  EKG shows no acute changes, troponin is negative and I doubt ACS/MI.  Abdomen is distended and tender to palpation, awaiting CT abdomen and pelvis.  Suspect CHF exacerbation due to medication noncompliance with  possible complicating cocaine abuse.  Given IV Lasix and her home amlodipinein the ED.  12:54 AM Signed out to oncoming provider PA Up[place]. Awaiting CT abdomen and pelvis. If no acute surgical abdominal pathology, will require admission for CHF exacerbation with hypertensive urgency.   Final Clinical Impressions(s) / ED Diagnoses   Final diagnoses:  Acute on chronic congestive heart failure, unspecified heart failure type (HCC)  Nausea vomiting and diarrhea  Rash    ED Discharge Orders    None       Renita Papa, PA-C 06/21/18 0056    Lennice Sites, DO 06/21/18 0149

## 2018-06-20 NOTE — ED Triage Notes (Addendum)
Per EMS, pt was brought from home due to N/V and weakness x 4 days. Per EMS, pt also c/o a rash in her groin area with unknown cause x3 days.   20 G left AC

## 2018-06-21 ENCOUNTER — Emergency Department (HOSPITAL_COMMUNITY): Payer: Medicaid Other

## 2018-06-21 ENCOUNTER — Encounter (HOSPITAL_COMMUNITY): Payer: Self-pay

## 2018-06-21 DIAGNOSIS — I5033 Acute on chronic diastolic (congestive) heart failure: Secondary | ICD-10-CM | POA: Diagnosis not present

## 2018-06-21 LAB — TROPONIN I
TROPONIN I: 0.06 ng/mL — AB (ref <0.03)
TROPONIN I: 0.05 ng/mL — AB (ref <0.03)
TROPONIN I: 0.05 ng/mL — AB (ref <0.03)

## 2018-06-21 LAB — RAPID URINE DRUG SCREEN, HOSP PERFORMED
Amphetamines: NOT DETECTED
Barbiturates: NOT DETECTED
Benzodiazepines: NOT DETECTED
Cocaine: POSITIVE — AB
OPIATES: NOT DETECTED
Tetrahydrocannabinol: POSITIVE — AB

## 2018-06-21 LAB — MRSA PCR SCREENING: MRSA by PCR: NEGATIVE

## 2018-06-21 MED ORDER — ONDANSETRON HCL 4 MG/2ML IJ SOLN: 4.0000 mg | Freq: Four times a day (QID) | INTRAMUSCULAR | Status: DC | PRN

## 2018-06-21 MED ORDER — IOPAMIDOL (ISOVUE-300) INJECTION 61%
100.0000 mL | Freq: Once | INTRAVENOUS | Status: AC | PRN
  Administered 2018-06-21: 100 mL via INTRAVENOUS

## 2018-06-21 MED ORDER — ENOXAPARIN SODIUM 40 MG/0.4ML ~~LOC~~ SOLN: 40.0000 mg | SUBCUTANEOUS | Status: DC

## 2018-06-21 MED ORDER — ACETAMINOPHEN 325 MG PO TABS: 650.0000 mg | ORAL_TABLET | ORAL | Status: DC | PRN

## 2018-06-21 MED ORDER — NYSTATIN 100000 UNIT/GM EX POWD
Freq: Three times a day (TID) | CUTANEOUS | Status: DC
  Administered 2018-06-21 – 2018-06-23 (×7): via TOPICAL
  Filled 2018-06-21: qty 15

## 2018-06-21 MED ORDER — SODIUM CHLORIDE 0.9 % IV SOLN: 250.0000 mL | INTRAVENOUS | Status: DC | PRN

## 2018-06-21 MED ORDER — IOPAMIDOL (ISOVUE-300) INJECTION 61%
INTRAVENOUS | Status: AC
  Administered 2018-06-21: 05:00:00
  Filled 2018-06-21: qty 100

## 2018-06-21 MED ORDER — ENOXAPARIN SODIUM 30 MG/0.3ML ~~LOC~~ SOLN
30.0000 mg | SUBCUTANEOUS | Status: DC
  Administered 2018-06-21 – 2018-06-23 (×3): 30 mg via SUBCUTANEOUS
  Filled 2018-06-21 (×3): qty 0.3

## 2018-06-21 MED ORDER — POTASSIUM CHLORIDE CRYS ER 20 MEQ PO TBCR
40.0000 meq | EXTENDED_RELEASE_TABLET | Freq: Once | ORAL | Status: AC
  Administered 2018-06-21: 40 meq via ORAL
  Filled 2018-06-21: qty 2

## 2018-06-21 MED ORDER — SODIUM CHLORIDE 0.9% FLUSH
3.0000 mL | Freq: Two times a day (BID) | INTRAVENOUS | Status: DC
  Administered 2018-06-21 – 2018-06-23 (×6): 3 mL via INTRAVENOUS

## 2018-06-21 MED ORDER — AMLODIPINE BESYLATE 10 MG PO TABS
10.0000 mg | ORAL_TABLET | Freq: Every day | ORAL | Status: DC
  Administered 2018-06-21 – 2018-06-23 (×3): 10 mg via ORAL
  Filled 2018-06-21 (×4): qty 1

## 2018-06-21 MED ORDER — FUROSEMIDE 10 MG/ML IJ SOLN
40.0000 mg | Freq: Every day | INTRAMUSCULAR | Status: DC
  Administered 2018-06-21 – 2018-06-22 (×2): 40 mg via INTRAVENOUS
  Filled 2018-06-21 (×2): qty 4

## 2018-06-21 MED ORDER — IOHEXOL 300 MG/ML  SOLN: 25.0000 mL | Freq: Once | INTRAMUSCULAR | Status: DC | PRN

## 2018-06-21 MED ORDER — SODIUM CHLORIDE 0.9% FLUSH: 3.0000 mL | INTRAVENOUS | Status: DC | PRN

## 2018-06-21 MED ORDER — FUROSEMIDE 10 MG/ML IJ SOLN
20.0000 mg | Freq: Once | INTRAMUSCULAR | Status: AC
  Administered 2018-06-21: 20 mg via INTRAVENOUS
  Filled 2018-06-21: qty 4

## 2018-06-21 NOTE — ED Provider Notes (Signed)
H/o bilateral AKA, cocaine abuse, CHF Cough, CP, SOB, N, V, D x 4 days and left groin rash Recent admission for CHF (51month She reports no meds since that time.  Labs today same as previous admission Pending CT scan abd  Plan - review CT for positive findings and treat accordingly. Ultimately the patient will need admit for CHF, noncompliance  Patient's CT negative for acute process requiring intervention. Discussed admission with [facility], Dr. GAlcario Droughtwho accepted the patient onto his service.    UCharlann Lange PA-C 06/21/18 0701    CLennice Sites DO 06/21/18 1943

## 2018-06-21 NOTE — Progress Notes (Signed)
Thamas Jaegers, MSN, RN

## 2018-06-21 NOTE — Care Management Note (Signed)
Case Management Note  Patient Details  Name: [name] MRN: [id number] Date of Birth: November 05, 1960  Subjective/Objective:Admitted w/CHF. From boarding home-[place] [facility], has custodial level care,w/c.+cocaine use-declines counseling. Informed of CHF protocal through Dr. Tempie Hoist cardiology, by [facility] agency-patient declines.Will need PTAR @ d/c.                    Action/Plan:dc Boarding home by PTAR.   Expected Discharge Date:                  Expected Discharge Plan:  Home/Self Care  In-House Referral:  Clinical Social Work  Discharge planning Services  CM Consult  Post Acute Care Choice:  Durable Medical Equipment, Resumption of Svcs/PTA Provider(w/c;custodial level care) Choice offered to:     DME Arranged:    DME Agency:     HH Arranged:    HH Agency:     Status of Service:  In process, will continue to follow  If discussed at Long Length of Stay Meetings, dates discussed:    Additional Comments:  Dessa Phi, RN 06/21/2018, 12:13 PM

## 2018-06-21 NOTE — ED Notes (Signed)
ED TO INPATIENT HANDOFF REPORT  Name/Age/Gender [name] 57 y.o. female  Code Status    Code Status Orders  (From admission, onward)         Start     Ordered   06/21/18 0246  Full code  Continuous     06/21/18 0247        Code Status History    Date Active Date Inactive Code Status Order ID Comments User Context   05/20/2018 0145 05/23/2018 1806 Full Code [id number]  Bethena Roys, MD Inpatient   02/02/2018 1830 03/03/2018 0033 Full Code [id number]  Annita Brod, MD Inpatient   02/02/2018 1325 02/02/2018 1830 Full Code [id number]  Jola Schmidt, MD ED   10/23/2017 1300 10/26/2017 2237 Full Code [id number]  Ina Homes, MD ED   08/09/2017 2116 08/11/2017 1245 Full Code [id number]  Street, [place], PA-C ED   06/05/2017 1510 06/06/2017 2155 Full Code [id number]  Elwin Mocha, MD ED   04/13/2017 1822 04/17/2017 1139 Full Code [id number]  Cathlyn Parsons, PA-C Inpatient   04/13/2017 1822 04/13/2017 1822 Full Code [id number]  Cathlyn Parsons, PA-C Inpatient   04/08/2017 0835 04/13/2017 1817 Full Code [id number]  Lavina Hamman, MD ED   03/30/2017 1851 04/01/2017 2024 Full Code [id number]  Verlee Monte, MD Inpatient   02/06/2017 0605 02/08/2017 2031 Full Code [id number]  Riccardo Dubin, MD ED   12/22/2016 2131 12/28/2016 2234 Full Code [id number]  Burgess Estelle, MD Inpatient   10/14/2016 1525 10/24/2016 2253 Full Code [id number]  Jule Ser, DO Inpatient   09/21/2016 0107 09/22/2016 1910 Full Code [id number]  Junius Creamer, NP ED   09/09/2016 1722 09/11/2016 1701 Full Code [id number]  Juliet Rude, MD Inpatient   07/25/2016 0525 07/29/2016 2120 Full Code [id number]  Juliet Rude, MD Inpatient   06/14/2016 1513 06/19/2016 1931 Full Code [id number]  Florinda Marker, MD Inpatient   05/18/2016 1959 05/23/2016 1717 Full Code [id number]  Juliet Rude, MD Inpatient   04/27/2016 0911 05/01/2016 1916 Full Code [id number]  Florinda Marker, MD Inpatient   04/01/2016 1727 04/04/2016 1651 Full Code [id number]   Jones Bales, MD Inpatient   11/14/2015 2316 11/15/2015 1729 Full Code [id number]  Juluis Mire, MD ED   10/23/2015 1615 10/29/2015 1636 Full Code [id number]  Juliet Rude, MD Inpatient   09/25/2015 0536 10/02/2015 1911 Full Code [id number]  Riccardo Dubin, MD ED   06/15/2015 0147 06/17/2015 2039 Full Code [id number]  Vickii Chafe, MD Inpatient   06/04/2015 2045 06/05/2015 2133 Full Code [id number]  Corky Sox, MD Inpatient   04/21/2015 1405 04/22/2015 2225 Full Code [id number]  Corky Sox, MD Inpatient   04/08/2015 0101 04/08/2015 2015 Full Code [id number]  Dellia Nims, MD Inpatient   03/09/2015 2135 03/12/2015 0031 Full Code [id number]  Karlene Einstein, MD Inpatient   01/14/2015 1734 01/17/2015 0110 Full Code [id number]  Juliet Rude, MD Inpatient   10/09/2014 2245 10/17/2014 1738 Full Code [id number]  Laverle Hobby, PA-C Inpatient   10/09/2014 0300 10/09/2014 2245 Full Code [id number]  Everlene Balls, MD ED   05/07/2014 1709 05/08/2014 1923 Full Code [id number]  Benjamine Mola, [place] Inpatient   05/07/2014 0835 05/07/2014 1709 Full Code [id number]  Ernestina Patches, MD ED   05/07/2014 0108 05/07/2014 0835 Full Code [id number]  Carmin Muskrat, MD ED   04/19/2014 1726 04/22/2014 1422 Full Code [id number]  Clinton Gallant, MD Inpatient   03/30/2014 0327 03/31/2014 2008 Full Code  [id number]  Cresenciano Genre Inpatient   01/13/2014 1823 01/26/2014 1651 Full Code [id number]  Waylan Boga, NP Inpatient   01/13/2014 0457 01/13/2014 1823 Full Code [id number]  Garald Balding, NP ED   10/11/2013 0450 10/17/2013 2005 Full Code [id number]  Blain Pais, MD ED   09/24/2013 1852 10/02/2013 1807 Full Code [id number]  Blain Pais, MD Inpatient   08/30/2013 0406 08/30/2013 2340 Full Code [id number]  Martie Lee, PA-C ED   06/23/2013 2105 06/28/2013 1756 Full Code [id number]  Delfin Gant, NP Inpatient   06/22/2013 1601 06/23/2013 2105 Full Code [id number]  Mariea Clonts, MD ED   06/13/2013 2340 06/14/2013 2045 Full Code [id number]   Othella Boyer, MD Inpatient   05/13/2013 1123 05/14/2013 2124 Full Code [id number]  Pisciotta, Charna Elizabeth ED   02/13/2012 1603 02/15/2012 1829 Full Code [id number]  Shary Decamp, RN Inpatient   10/14/2011 1306 10/14/2011 1306 DNR [id number]  Thera Flake, MD Inpatient      Home/SNF/Other Home  Chief Complaint Nausea/Vomiting/Weakness  Level of Care/Admitting Diagnosis ED Disposition    ED Disposition Condition [place] [facility] Area: [facility] [[id number]]  Level of Care: Telemetry [5]  Admit to tele based on following criteria: Acute CHF  Diagnosis: Acute on chronic diastolic CHF (congestive heart failure) [facility]) [[id number]]  Admitting Physician: Etta Quill [phone]  Attending Physician: Etta Quill [[id number]]  PT Class (Do Not Modify): Observation [104]  PT Acc Code (Do Not Modify): Observation [10022]       Medical History Past Medical History:  Diagnosis Date  . CAP (community acquired pneumonia) 03/2014 X 2  . Cocaine abuse ([place])    ongoing with resultant vaculitis.  . Depression   . Headache    "weekly" (07/29/2016)  . Hypertension   . Inflammatory arthritis   . Migraines    "probably 5-6/yr" (07/29/2016)  . Normocytic anemia    BL Hgb 9.8-12. Last anemia panel 04/2010 - showing Fe 19, ferritin 101.  Pt on monthly B12 injections  . Rheumatoid arthritis(714.0)    patient reported  . 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 Followed by Dr. Louanne Skye    Allergies Allergies  Allergen Reactions  . Acetaminophen Swelling and Other (See Comments)    Reaction:  Eyelid swelling  . Lisinopril     UNSPECIFIED REACTION     IV Location/Drains/Wounds Patient Lines/Drains/Airways Status   Active Line/Drains/Airways    Name:   Placement date:   Placement time:   Site:   Days:   Peripheral IV 06/20/18 Left Antecubital   06/20/18    2136    Antecubital   1   External Urinary Catheter    02/04/18    0550    -   137   Incision (Closed) 02/06/18 Thigh Left   02/06/18    0809     135   Incision (Closed) 02/06/18 Thigh Right   02/06/18    0809     135   Wound / Incision (Open or Dehisced) 02/02/18 Other (Comment) Knee Bilateral;Distal   02/02/18    1636    Knee   139   Wound / Incision (Open or Dehisced) 02/02/18 Other (Comment) Sacrum Mid   02/02/18    1636    Sacrum   139   Wound / Incision (Open or Dehisced) 02/02/18 Other (Comment) Ear Bilateral   02/02/18  1636    Ear   139          Labs/Imaging Results for orders placed or performed during the hospital encounter of 06/20/18 (from the past 48 hour(s))  CBC with Differential     Status: Abnormal   Collection Time: 06/20/18 10:39 PM  Result Value Ref Range   WBC 6.5 4.0 - 10.5 K/uL   RBC 4.14 3.87 - 5.11 MIL/uL   Hemoglobin 11.1 (L) 12.0 - 15.0 g/dL   HCT 34.8 (L) 36.0 - 46.0 %   MCV 84.1 78.0 - 100.0 fL   MCH 26.8 26.0 - 34.0 pg   MCHC 31.9 30.0 - 36.0 g/dL   RDW 17.7 (H) 11.5 - 15.5 %   Platelets 245 150 - 400 K/uL   Neutrophils Relative % 62 %   Neutro Abs 4.0 1.7 - 7.7 K/uL   Lymphocytes Relative 28 %   Lymphs Abs 1.8 0.7 - 4.0 K/uL   Monocytes Relative 9 %   Monocytes Absolute 0.6 0.1 - 1.0 K/uL   Eosinophils Relative 1 %   Eosinophils Absolute 0.0 0.0 - 0.7 K/uL   Basophils Relative 0 %   Basophils Absolute 0.0 0.0 - 0.1 K/uL    Comment: Performed at [facility], [place] [address], [place], [place] [address]  Comprehensive metabolic panel     Status: Abnormal   Collection Time: 06/20/18 10:39 PM  Result Value Ref Range   Sodium 136 135 - 145 mmol/L   Potassium 3.4 (L) 3.5 - 5.1 mmol/L   Chloride 106 98 - 111 mmol/L   CO2 19 (L) 22 - 32 mmol/L   Glucose, Bld 117 (H) 70 - 99 mg/dL   BUN 16 6 - 20 mg/dL   Creatinine, Ser 0.83 0.44 - 1.00 mg/dL   Calcium 8.5 (L) 8.9 - 10.3 mg/dL   Total Protein 6.3 (L) 6.5 - 8.1 g/dL   Albumin 3.2 (L) 3.5 - 5.0 g/dL   AST 27 15 - 41 U/L   ALT 21 0  - 44 U/L   Alkaline Phosphatase 108 38 - 126 U/L   Total Bilirubin 1.5 (H) 0.3 - 1.2 mg/dL   GFR calc non Af Amer >60 >60 mL/min   GFR calc Af Amer >60 >60 mL/min    Comment: (NOTE) The eGFR has been calculated using the CKD EPI equation. This calculation has not been validated in all clinical situations. eGFR's persistently <60 mL/min signify possible Chronic Kidney Disease.    Anion gap 11 5 - 15    Comment: Performed at [facility], [place] [address], [place], [place] [address]  Lipase, blood     Status: None   Collection Time: 06/20/18 10:39 PM  Result Value Ref Range   Lipase 30 11 - 51 U/L    Comment: Performed at [facility], [place] [address], [place], [place] [address]  Urinalysis, Routine w reflex microscopic     Status: Abnormal   Collection Time: 06/20/18 10:39 PM  Result Value Ref Range   Color, Urine YELLOW YELLOW   APPearance CLEAR CLEAR   Specific Gravity, Urine 1.016 1.005 - 1.030   pH 6.0 5.0 - 8.0   Glucose, UA NEGATIVE NEGATIVE mg/dL   Hgb urine dipstick SMALL (A) NEGATIVE   Bilirubin Urine NEGATIVE NEGATIVE   Ketones, ur NEGATIVE NEGATIVE mg/dL   Protein, ur 30 (A) NEGATIVE mg/dL   Nitrite NEGATIVE NEGATIVE   Leukocytes, UA NEGATIVE NEGATIVE   RBC / HPF  0-5 0 - 5 RBC/hpf   WBC, UA 0-5 0 - 5 WBC/hpf   Bacteria, UA NONE SEEN NONE SEEN   Squamous Epithelial / LPF 6-10 0 - 5   Mucus PRESENT     Comment: Performed at [facility], [place] [address] [place], [place] [address]  Brain natriuretic peptide     Status: Abnormal   Collection Time: 06/20/18 10:39 PM  Result Value Ref Range   B Natriuretic Peptide 3,612.1 (H) 0.0 - 100.0 pg/mL    Comment: Performed at [facility], [place] [address], [place], [place] [address]  I-stat troponin, ED     Status: None   Collection Time: 06/20/18 10:56 PM  Result Value Ref Range   Troponin i, poc 0.02 0.00 - 0.08 ng/mL   Comment 3            Comment:  Due to the release kinetics of cTnI, a negative result within the first hours of the onset of symptoms does not rule out myocardial infarction with certainty. If myocardial infarction is still suspected, repeat the test at appropriate intervals.   Rapid urine drug screen (hospital performed)     Status: Abnormal   Collection Time: 06/21/18 12:09 AM  Result Value Ref Range   Opiates NONE DETECTED NONE DETECTED   Cocaine POSITIVE (A) NONE DETECTED   Benzodiazepines NONE DETECTED NONE DETECTED   Amphetamines NONE DETECTED NONE DETECTED   Tetrahydrocannabinol POSITIVE (A) NONE DETECTED   Barbiturates NONE DETECTED NONE DETECTED    Comment: (NOTE) DRUG SCREEN FOR MEDICAL PURPOSES ONLY.  IF CONFIRMATION IS NEEDED FOR ANY PURPOSE, NOTIFY LAB WITHIN 5 DAYS. LOWEST DETECTABLE LIMITS FOR URINE DRUG SCREEN Drug Class                     Cutoff (ng/mL) Amphetamine and metabolites    1000 Barbiturate and metabolites    200 Benzodiazepine                 017 Tricyclics and metabolites     300 Opiates and metabolites        300 Cocaine and metabolites        300 THC                            50 Performed at [facility], [place] [address], [place], [place] [address]    Dg Chest 2 View  Result Date: 06/20/2018 CLINICAL DATA:  Cough and dyspnea. Nausea, vomiting and weakness x4 days. EXAM: CHEST - 2 VIEW COMPARISON:  05/19/2018 FINDINGS: There is stable cardiomegaly with aortic atherosclerosis and posterior right moderate pleural effusion. Mild interstitial edema is identified with low lung volumes. No alveolar consolidation. No acute osseous abnormality. IMPRESSION: Stable cardiomegaly with aortic atherosclerosis. Mild interstitial edema and stable posterior right pleural effusion. Electronically Signed   By: Ashley Royalty M.D.   On: 06/20/2018 22:49   Ct Abdomen Pelvis W Contrast  Result Date: 06/21/2018 CLINICAL DATA:  Nausea, vomiting, weakness, and generalized abdominal  pain. Rash in the groin for 3 days. EXAM: CT ABDOMEN AND PELVIS WITH CONTRAST TECHNIQUE: Multidetector CT imaging of the abdomen and pelvis was performed using the standard protocol following bolus administration of intravenous contrast. CONTRAST:  161m ISOVUE-300 IOPAMIDOL (ISOVUE-300) INJECTION 61% COMPARISON:  10/12/2010 FINDINGS: Lower chest: Cardiac enlargement. Small to moderate bilateral pleural effusions with basilar atelectasis. Hepatobiliary: There is prominent reflux of contrast material into  the IVC and hepatic veins with dilated patent veins. Heterogeneous parenchymal echotexture. Changes are most consistent with passive hepatic congestion (nutmeg liver). Gallbladder is contracted, likely physiologic. No bile duct dilatation. Pancreas: Unremarkable. No pancreatic ductal dilatation or surrounding inflammatory changes. Spleen: Normal in size without focal abnormality. Adrenals/Urinary Tract: No adrenal gland nodules. Renal nephrograms are symmetrical. Nephrograms are somewhat heterogeneous on the delayed phase which may indicate pyelonephritis. No focal renal lesions. No hydronephrosis or hydroureter. Bladder is unremarkable. Stomach/Bowel: Stomach, small bowel, and colon are not abnormally distended. There is diffuse wall thickening of the small bowel. This is likely due to hypoproteinemia. Enteritis could also have this appearance. Scattered stool throughout the colon. No colonic wall thickening. Appendix is normal. Vascular/Lymphatic: Aortic atherosclerosis. No enlarged abdominal or pelvic lymph nodes. Reproductive: Uterus and bilateral adnexa are unremarkable. Other: Prominent diffuse abdominal and pelvic ascites. No free air in the abdomen. Abdominal wall musculature appears intact. Diffuse edema throughout the subcutaneous fatty tissues. Musculoskeletal: No acute or significant osseous findings. IMPRESSION: 1. Cardiac enlargement with bilateral pleural effusions and basilar atelectasis. 2.  Prominent reflux of contrast material into the IVC and hepatic veins with dilated patent veins suggesting passive hepatic congestion. 3. Diffuse wall thickening of the small bowel probably due to hypoproteinemia. Nonspecific enteritis also possible. No evidence of bowel obstruction. 4. Diffuse abdominal and pelvic ascites. Diffuse edema throughout the subcutaneous fatty tissues. Electronically Signed   By: Lucienne Capers M.D.   On: 06/21/2018 02:18    Pending Labs Unresulted Labs (From admission, onward)    Start     Ordered   06/22/18 0998  Basic metabolic panel  Daily,   R     06/21/18 0247   06/21/18 0307  Troponin I (q 6hr x 3)  Now then every 6 hours,   R     06/21/18 0306          Vitals/Pain Today's Vitals   06/20/18 2151 06/20/18 2152 06/21/18 0110 06/21/18 0111  BP:   (!) 171/119   Pulse:   98   Resp:   18   Temp:      TempSrc:      SpO2:   98%   Height:  5' 1"  (1.549 m)    PainSc: 10-Worst pain ever   8     Isolation Precautions No active isolations  Medications Medications  iohexol (OMNIPAQUE) 300 MG/ML solution 25 mL (has no administration in time range)  iopamidol (ISOVUE-300) 61 % injection (has no administration in time range)  sodium chloride flush (NS) 0.9 % injection 3 mL (3 mLs Intravenous Given 06/21/18 0302)  sodium chloride flush (NS) 0.9 % injection 3 mL (has no administration in time range)  0.9 %  sodium chloride infusion (has no administration in time range)  ondansetron (ZOFRAN) injection 4 mg (has no administration in time range)  enoxaparin (LOVENOX) injection 40 mg (has no administration in time range)  furosemide (LASIX) injection 40 mg (has no administration in time range)  amLODipine (NORVASC) tablet 10 mg (has no administration in time range)  nystatin (MYCOSTATIN/NYSTOP) topical powder (has no administration in time range)  nystatin (MYCOSTATIN/NYSTOP) topical powder ( Topical Given 06/20/18 2313)  ondansetron (ZOFRAN) injection 4 mg (4  mg Intravenous Given 06/20/18 2308)  sodium chloride 0.9 % bolus 500 mL (0 mLs Intravenous Stopped 06/21/18 0050)  amLODipine (NORVASC) tablet 10 mg (10 mg Oral Given 06/20/18 2308)  furosemide (LASIX) tablet 40 mg (40 mg Oral Given 06/21/18 0038)  potassium chloride SA (  K-DUR,KLOR-CON) CR tablet 40 mEq (40 mEq Oral Given 06/21/18 0038)  iopamidol (ISOVUE-300) 61 % injection 100 mL (100 mLs Intravenous Contrast Given 06/21/18 0141)  furosemide (LASIX) injection 20 mg (20 mg Intravenous Given 06/21/18 0259)    Mobility non-ambulatory

## 2018-06-21 NOTE — Progress Notes (Signed)
CRITICAL VALUE ALERT  Critical Value:  Troponin 0.06   Date & Time Notied: 6:23 AM 06/21/18   Provider Notified: Alcario Drought   Orders Received/Actions taken: Patient currently on telemetry, Troponin's are being cycled, will continue to monitor patient

## 2018-06-21 NOTE — Plan of Care (Signed)
D18-year-old female admitted by my partner early this morning with acute on chronic CHF exacerbation.  Patient has a history of cocaine abuse bilateral above-knee amputation, hypertension grade 2 diastolic dysfunction by an echo done August 2019.  Her urine drug screen was positive for cocaine.  She denies any nausea vomiting or diarrhea.  Her only complaint is at this time is difficulty breathing.  Chest x-ray showed mild pulmonary edema BNP was more than 3000 she is currently being treated with IV Lasix.

## 2018-06-21 NOTE — H&P (Addendum)
History and Physical    [name]:[id number] DOB: Feb 17, 1961 DOA: 06/20/2018  PCP: Azzie Glatter, FNP  Patient coming from: Home  I have personally briefly reviewed patient's old medical records in [place]  Chief Complaint: SOB  HPI: [name] is a 57 y.o. female with medical history significant of cocaine abuse, B AKA, HTN, grade 2 diastolic dysfunction by Echo last month.  Patient was admitted for CHF last month.  Since discharge she admits she hasnt taken any of the prescribed BP meds or lasix.  She presents to the ED with N/V/D for past 4 days, CP, and SOB.  Symptoms are worsening, severe, nothing makes better or worse.   ED Course: In the ED her UDS is positive for cocaine.  BNP > 3000, CXR shows mild pulm edema, CT scan shows reflux of contrast down the IVC.  Given 63m PO lasix in ED.  Review of Systems: As per HPI otherwise 10 point review of systems negative.   Past Medical History:  Diagnosis Date  . CAP (community acquired pneumonia) 03/2014 X 2  . [place]    ongoing with resultant vaculitis.  . Depression   . Headache    "weekly" (07/29/2016)  . Hypertension   . Inflammatory arthritis   . Migraines    "probably 5-6/yr" (07/29/2016)  . Normocytic anemia    BL Hgb 9.8-12. Last anemia panel 04/2010 - showing Fe 19, ferritin 101.  Pt on monthly B12 injections  . Rheumatoid arthritis(714.0)    patient reported  . 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 Followed by Dr. NLouanne Skye   Past Surgical History:  Procedure Laterality Date  . AMPUTATION Left 05/22/2016   Procedure: AMPUTATION LEFT LONG FINGER;  Surgeon: MMarybelle Killings MD;  [place] [facility]  Service: Orthopedics;  Laterality: Left;  . AMPUTATION Bilateral 04/10/2017   Procedure: AMPUTATION BELOW KNEE;  Surgeon: DNewt Minion MD;  [place] [facility]  Service: Orthopedics;  Laterality: Bilateral;  . AMPUTATION Bilateral  02/06/2018   Procedure: AMPUTATION ABOVE KNEE;  Surgeon: DNewt Minion MD;  [place] [facility]  Service: Orthopedics;  Laterality: Bilateral;  . HERNIA REPAIR     "stomach"  . I&D EXTREMITY Right 09/26/2015   Procedure: IRRIGATION AND DEBRIDEMENT LEG WOUND  VAC PLACEMENT.;  Surgeon: CLoel LoftyDillingham, DO;  [place] [facility]  Service: Plastics;  Laterality: Right;  . INCISION AND DRAINAGE OF WOUND Bilateral 10/20/2016   Procedure: IRRIGATION AND DEBRIDEMENT WOUND BILATERAL;  Surgeon: BEdrick Kins DPM;  [place] [facility]  Service: Podiatry;  Laterality: Bilateral;  . IRRIGATION AND DEBRIDEMENT ABSCESS Bilateral 09/26/2013   Procedure: DEBRIDEMENT ULCERS BILATERAL THIGHS;  Surgeon: JGwenyth Ober MD;  [place] [facility]  Service: General;  Laterality: Bilateral;  . SKIN BIOPSY Bilateral    shin nodules     reports that she has been smoking cigarettes. She has a 4.56 pack-year smoking history. She has never used smokeless tobacco. She reports that she has current or past drug history. Drugs: "Crack" cocaine and Cocaine. She reports that she does not drink alcohol.  Allergies  Allergen Reactions  . Acetaminophen Swelling and Other (See Comments)    Reaction:  Eyelid swelling  . Lisinopril     UNSPECIFIED REACTION     Family History  Problem Relation Age of Onset  . Breast cancer Mother        Breast cancer  .  Alcohol abuse Mother   . Colon cancer Maternal Aunt 46  . Alcohol abuse Father      Prior to Admission medications   Medication Sig Start Date End Date Taking? Authorizing Provider  amLODipine (NORVASC) 10 MG tablet Take 1 tablet (10 mg total) by mouth daily. Patient not taking: Reported on 05/30/2018 05/24/18   Edwin Dada, MD  furosemide (LASIX) 40 MG tablet Take 1 tablet (40 mg total) by mouth daily. Patient not taking: Reported on 05/30/2018 05/24/18   Edwin Dada, MD  nicotine (NICODERM CQ - DOSED IN MG/24 HR) 7 mg/24hr patch Place 1 patch (7 mg total) onto  the skin daily. Patient not taking: Reported on 05/30/2018 05/23/18   Edwin Dada, MD  potassium chloride (K-DUR) 10 MEQ tablet Take 1 tablet (10 mEq total) by mouth daily. Patient not taking: Reported on 05/30/2018 05/23/18   Edwin Dada, MD    Physical Exam: Vitals:   06/20/18 2136 06/20/18 2146 06/20/18 2152 06/21/18 0110  BP:  (!) 181/112  (!) 171/119  Pulse:  98  98  Resp:  18  18  Temp:  (!) 97.5 F (36.4 C)    TempSrc:  Oral    SpO2: 99% 99%  98%  Height:   5' 1"  (1.549 m)     Constitutional: Dyspnic, but speaking full sentences Eyes: PERRL, lids and conjunctivae normal ENMT: Mucous membranes are moist. Posterior pharynx clear of any exudate or lesions.Normal dentition.  Neck: normal, supple, no masses, no thyromegaly Respiratory: Scattered rhonchi and wheezes Cardiovascular: Regular rate and rhythm, no murmurs / rubs / gallops. No extremity edema. 2+ pedal pulses. No carotid bruits.  Abdomen: no tenderness, no masses palpated. No hepatosplenomegaly. Bowel sounds positive.  Musculoskeletal: S/P B AKA Skin: Patient has a rash to the left groin with satellite lesions,  Neurologic: CN 2-12 grossly intact. Sensation intact, DTR normal. Strength 5/5 in all 4.  Psychiatric: Normal judgment and insight. Alert and oriented x 3. Normal mood.    Labs on Admission: I have personally reviewed following labs and imaging studies  CBC: Recent Labs  Lab 06/20/18 2239  WBC 6.5  NEUTROABS 4.0  HGB 11.1*  HCT 34.8*  MCV 84.1  PLT 409   Basic Metabolic Panel: Recent Labs  Lab 06/20/18 2239  NA 136  K 3.4*  CL 106  CO2 19*  GLUCOSE 117*  BUN 16  CREATININE 0.83  CALCIUM 8.5*   GFR: CrCl cannot be calculated (Unknown ideal weight.). Liver Function Tests: Recent Labs  Lab 06/20/18 2239  AST 27  ALT 21  ALKPHOS 108  BILITOT 1.5*  PROT 6.3*  ALBUMIN 3.2*   Recent Labs  Lab 06/20/18 2239  LIPASE 30   No results for input(s): AMMONIA in the  last 168 hours. Coagulation Profile: No results for input(s): INR, PROTIME in the last 168 hours. Cardiac Enzymes: No results for input(s): CKTOTAL, CKMB, CKMBINDEX, TROPONINI in the last 168 hours. BNP (last 3 results) No results for input(s): PROBNP in the last 8760 hours. HbA1C: No results for input(s): HGBA1C in the last 72 hours. CBG: No results for input(s): GLUCAP in the last 168 hours. Lipid Profile: No results for input(s): CHOL, HDL, LDLCALC, TRIG, CHOLHDL, LDLDIRECT in the last 72 hours. Thyroid Function Tests: No results for input(s): TSH, T4TOTAL, FREET4, T3FREE, THYROIDAB in the last 72 hours. Anemia Panel: No results for input(s): VITAMINB12, FOLATE, FERRITIN, TIBC, IRON, RETICCTPCT in the last 72 hours. Urine analysis:  Component Value Date/Time   COLORURINE YELLOW 06/20/2018 2239   APPEARANCEUR CLEAR 06/20/2018 2239   LABSPEC 1.016 06/20/2018 2239   PHURINE 6.0 06/20/2018 2239   GLUCOSEU NEGATIVE 06/20/2018 2239   HGBUR SMALL (A) 06/20/2018 2239   BILIRUBINUR NEGATIVE 06/20/2018 2239   BILIRUBINUR small 04/29/2015 1533   KETONESUR NEGATIVE 06/20/2018 2239   PROTEINUR 30 (A) 06/20/2018 2239   UROBILINOGEN 1.0 07/26/2017 1550   NITRITE NEGATIVE 06/20/2018 2239   LEUKOCYTESUR NEGATIVE 06/20/2018 2239    Radiological Exams on Admission: Dg Chest 2 View  Result Date: 06/20/2018 CLINICAL DATA:  Cough and dyspnea. Nausea, vomiting and weakness x4 days. EXAM: CHEST - 2 VIEW COMPARISON:  05/19/2018 FINDINGS: There is stable cardiomegaly with aortic atherosclerosis and posterior right moderate pleural effusion. Mild interstitial edema is identified with low lung volumes. No alveolar consolidation. No acute osseous abnormality. IMPRESSION: Stable cardiomegaly with aortic atherosclerosis. Mild interstitial edema and stable posterior right pleural effusion. Electronically Signed   By: Ashley Royalty M.D.   On: 06/20/2018 22:49   Ct Abdomen Pelvis W Contrast  Result Date:  06/21/2018 CLINICAL DATA:  Nausea, vomiting, weakness, and generalized abdominal pain. Rash in the groin for 3 days. EXAM: CT ABDOMEN AND PELVIS WITH CONTRAST TECHNIQUE: Multidetector CT imaging of the abdomen and pelvis was performed using the standard protocol following bolus administration of intravenous contrast. CONTRAST:  117m ISOVUE-300 IOPAMIDOL (ISOVUE-300) INJECTION 61% COMPARISON:  10/12/2010 FINDINGS: Lower chest: Cardiac enlargement. Small to moderate bilateral pleural effusions with basilar atelectasis. Hepatobiliary: There is prominent reflux of contrast material into the IVC and hepatic veins with dilated patent veins. Heterogeneous parenchymal echotexture. Changes are most consistent with passive hepatic congestion (nutmeg liver). Gallbladder is contracted, likely physiologic. No bile duct dilatation. Pancreas: Unremarkable. No pancreatic ductal dilatation or surrounding inflammatory changes. Spleen: Normal in size without focal abnormality. Adrenals/Urinary Tract: No adrenal gland nodules. Renal nephrograms are symmetrical. Nephrograms are somewhat heterogeneous on the delayed phase which may indicate pyelonephritis. No focal renal lesions. No hydronephrosis or hydroureter. Bladder is unremarkable. Stomach/Bowel: Stomach, small bowel, and colon are not abnormally distended. There is diffuse wall thickening of the small bowel. This is likely due to hypoproteinemia. Enteritis could also have this appearance. Scattered stool throughout the colon. No colonic wall thickening. Appendix is normal. Vascular/Lymphatic: Aortic atherosclerosis. No enlarged abdominal or pelvic lymph nodes. Reproductive: Uterus and bilateral adnexa are unremarkable. Other: Prominent diffuse abdominal and pelvic ascites. No free air in the abdomen. Abdominal wall musculature appears intact. Diffuse edema throughout the subcutaneous fatty tissues. Musculoskeletal: No acute or significant osseous findings. IMPRESSION: 1. Cardiac  enlargement with bilateral pleural effusions and basilar atelectasis. 2. Prominent reflux of contrast material into the IVC and hepatic veins with dilated patent veins suggesting passive hepatic congestion. 3. Diffuse wall thickening of the small bowel probably due to hypoproteinemia. Nonspecific enteritis also possible. No evidence of bowel obstruction. 4. Diffuse abdominal and pelvic ascites. Diffuse edema throughout the subcutaneous fatty tissues. Electronically Signed   By: WLucienne CapersM.D.   On: 06/21/2018 02:18    EKG: Independently reviewed.  Assessment/Plan Principal Problem:   Acute on chronic diastolic CHF (congestive heart failure) (HCC) Active Problems:   Essential hypertension   Cocaine use disorder, severe, dependence ([place]   S/P bilateral below knee amputation ([place]    1. Acute on chronic diastolic CHF - 1. CHF pathway 2. Additional Lasix 255mIV x1 now 3. Lasix 4084mV daily 4. BMP daily 5. Intake and output 2. HTN - 1.  Resume amlodipine with first dose now 2. If no rapid improvement, then NTG paste as next step 3. Tinea Cruris - Nystatin powder  DVT prophylaxis: Lovenox Code Status: Full Family Communication: No family in room Disposition Plan: Home after admit Consults called: None Admission status: Place in obs   GARDNER, JARED M. DO [facility] Pager [phone] Only works nights!  If 7AM-7PM, please contact the primary day team physician taking care of patient  [email or web address] Password [email or web address]  06/21/2018, 3:01 AM

## 2018-06-22 ENCOUNTER — Encounter (HOSPITAL_COMMUNITY): Payer: Self-pay

## 2018-06-22 DIAGNOSIS — F1420 Cocaine dependence, uncomplicated: Secondary | ICD-10-CM

## 2018-06-22 DIAGNOSIS — Z811 Family history of alcohol abuse and dependence: Secondary | ICD-10-CM | POA: Diagnosis not present

## 2018-06-22 DIAGNOSIS — Z803 Family history of malignant neoplasm of breast: Secondary | ICD-10-CM | POA: Diagnosis not present

## 2018-06-22 DIAGNOSIS — I10 Essential (primary) hypertension: Secondary | ICD-10-CM

## 2018-06-22 DIAGNOSIS — Z9114 Patient's other noncompliance with medication regimen: Secondary | ICD-10-CM | POA: Diagnosis not present

## 2018-06-22 DIAGNOSIS — Z89612 Acquired absence of left leg above knee: Secondary | ICD-10-CM | POA: Diagnosis not present

## 2018-06-22 DIAGNOSIS — Z89511 Acquired absence of right leg below knee: Secondary | ICD-10-CM

## 2018-06-22 DIAGNOSIS — F329 Major depressive disorder, single episode, unspecified: Secondary | ICD-10-CM | POA: Diagnosis present

## 2018-06-22 DIAGNOSIS — F1290 Cannabis use, unspecified, uncomplicated: Secondary | ICD-10-CM | POA: Diagnosis present

## 2018-06-22 DIAGNOSIS — I160 Hypertensive urgency: Secondary | ICD-10-CM | POA: Diagnosis present

## 2018-06-22 DIAGNOSIS — B356 Tinea cruris: Secondary | ICD-10-CM | POA: Diagnosis present

## 2018-06-22 DIAGNOSIS — M069 Rheumatoid arthritis, unspecified: Secondary | ICD-10-CM | POA: Diagnosis present

## 2018-06-22 DIAGNOSIS — Z89512 Acquired absence of left leg below knee: Secondary | ICD-10-CM

## 2018-06-22 DIAGNOSIS — F17200 Nicotine dependence, unspecified, uncomplicated: Secondary | ICD-10-CM | POA: Diagnosis present

## 2018-06-22 DIAGNOSIS — R21 Rash and other nonspecific skin eruption: Secondary | ICD-10-CM | POA: Diagnosis present

## 2018-06-22 DIAGNOSIS — Z888 Allergy status to other drugs, medicaments and biological substances status: Secondary | ICD-10-CM | POA: Diagnosis not present

## 2018-06-22 DIAGNOSIS — I110 Hypertensive heart disease with heart failure: Secondary | ICD-10-CM | POA: Diagnosis present

## 2018-06-22 DIAGNOSIS — Z800 Family history of malignant neoplasm of digestive organs: Secondary | ICD-10-CM | POA: Diagnosis not present

## 2018-06-22 DIAGNOSIS — E876 Hypokalemia: Secondary | ICD-10-CM | POA: Diagnosis present

## 2018-06-22 DIAGNOSIS — I5033 Acute on chronic diastolic (congestive) heart failure: Secondary | ICD-10-CM

## 2018-06-22 DIAGNOSIS — Z89611 Acquired absence of right leg above knee: Secondary | ICD-10-CM | POA: Diagnosis not present

## 2018-06-22 DIAGNOSIS — D649 Anemia, unspecified: Secondary | ICD-10-CM | POA: Diagnosis present

## 2018-06-22 DIAGNOSIS — Z89022 Acquired absence of left finger(s): Secondary | ICD-10-CM | POA: Diagnosis not present

## 2018-06-22 DIAGNOSIS — I272 Pulmonary hypertension, unspecified: Secondary | ICD-10-CM | POA: Diagnosis present

## 2018-06-22 LAB — BASIC METABOLIC PANEL
ANION GAP: 11 (ref 5–15)
BUN: 19 mg/dL (ref 6–20)
CHLORIDE: 101 mmol/L (ref 98–111)
CO2: 23 mmol/L (ref 22–32)
Calcium: 8.8 mg/dL — ABNORMAL LOW (ref 8.9–10.3)
Creatinine, Ser: 0.86 mg/dL (ref 0.44–1.00)
GFR calc Af Amer: >60 mL/min (ref >60)
GFR calc non Af Amer: >60 mL/min (ref >60)
GLUCOSE: 88 mg/dL (ref 70–99)
Potassium: 4.5 mmol/L (ref 3.5–5.1)
Sodium: 135 mmol/L (ref 135–145)

## 2018-06-22 NOTE — Progress Notes (Signed)
PROGRESS NOTE [facility]   [name]   HLK:[id number] DOB: August 21, 1961  DOA: 06/20/2018 PCP: Azzie Glatter, FNP   Brief Narrative:  [name] 57 year old female with medical history significant for diastolic CHF, hypertension, bilateral AKA, cocaine abuse who presented to the emergency department complaining of shortness of breath.  Patient report has not been taking her medications because "she ran out of her medications ".  Upon ED evaluation BMP of 3000, chest x-ray showed mild pulmonary edema.  Given Lasix with no significant improvement of work of breathing.  Patient admitted with working diagnosis of acute on chronic diastolic CHF exacerbation.  Subjective: Patient seen and examined, reports some improvement in her breathing however still short of breath.  Denies chest pain, palpitations and dizziness.  Patient started crying when consulted cocaine abuse.  "I am trying to quit "  Assessment & Plan: Acute on chronic diastolic CHF Likely due to cocaine abuse and noncompliant with medication. She had good diuresis 7 L of urine output so far.  Echocardiogram on 8/16 shows grade 2 diastolic dysfunction LVEF 60 to 65%, moderate pulmonary hypertension. Remains with some signs of fluid overload, still some JVD, bibasilar crackles and still SOB.  Will continue IV Lasix today.  Monitor I&O's, daily weights.  Low-salt diet.  Cocaine cessation discussed.  If respiratory status improves in a.m. can switch to oral Lasix and DC home.  Monitor renal function closely while on IV Lasix.  Hypertension Blood pressure stable, however diastolic slight above goal.  Continue amlodipine, will add hydralazine as needed.  Avoid beta-blocker due to cocaine abuse.  Monitor BP closely.  Tobacco and cocaine abuse  Cessation discussed  DVT prophylaxis: Lovenox Code Status: Full code Family Communication: None at bedside Disposition Plan: Home in a.m. if respiratory status  improved.  Consultants:   None  Procedures:   None  Antimicrobials:  None   Objective: Vitals:   06/22/18 0630 06/22/18 0647 06/22/18 0819 06/22/18 1259  BP: (!) 157/97   (!) 136/96  Pulse: 90   97  Resp:      Temp:      TempSrc:      SpO2:   98% 100%  Weight:  36.5 kg    Height:        Intake/Output Summary (Last 24 hours) at 06/22/2018 1702 Last data filed at 06/22/2018 1614 Gross per 24 hour  Intake 600 ml  Output 4650 ml  Net -4050 ml   Filed Weights   06/21/18 0335 06/22/18 0647  Weight: 41.7 kg 36.5 kg    Examination:  General exam: Appears calm and comfortable  HEENT: Mild JVD, OP moist and clear Respiratory system: Decreased breath sounds bilaterally, bibasilar crackles. Cardiovascular system: S1 & S2 heard, RRR. No JVD, murmurs, rubs or gallops Gastrointestinal system: Abdomen is nondistended, soft and nontender.  Central nervous system: Alert and oriented. Extremities: Bilateral AKA Skin: No rashes Psychiatry:  Mood & affect appropriate.    Data Reviewed: I have personally reviewed following labs and imaging studies  CBC: Recent Labs  Lab 06/20/18 2239  WBC 6.5  NEUTROABS 4.0  HGB 11.1*  HCT 34.8*  MCV 84.1  PLT 734   Basic Metabolic Panel: Recent Labs  Lab 06/20/18 2239 06/22/18 0516  NA 136 135  K 3.4* 4.5  CL 106 101  CO2 19* 23  GLUCOSE 117* 88  BUN 16 19  CREATININE 0.83 0.86  CALCIUM 8.5* 8.8*   GFR: Estimated Creatinine Clearance: 42.6 mL/min (by C-G  formula based on SCr of 0.86 mg/dL). Liver Function Tests: Recent Labs  Lab 06/20/18 2239  AST 27  ALT 21  ALKPHOS 108  BILITOT 1.5*  PROT 6.3*  ALBUMIN 3.2*   Recent Labs  Lab 06/20/18 2239  LIPASE 30   No results for input(s): AMMONIA in the last 168 hours. Coagulation Profile: No results for input(s): INR, PROTIME in the last 168 hours. Cardiac Enzymes: Recent Labs  Lab 06/21/18 0527 06/21/18 0925 06/21/18 1510  TROPONINI 0.06* 0.05* 0.05*   BNP  (last 3 results) No results for input(s): PROBNP in the last 8760 hours. HbA1C: No results for input(s): HGBA1C in the last 72 hours. CBG: No results for input(s): GLUCAP in the last 168 hours. Lipid Profile: No results for input(s): CHOL, HDL, LDLCALC, TRIG, CHOLHDL, LDLDIRECT in the last 72 hours. Thyroid Function Tests: No results for input(s): TSH, T4TOTAL, FREET4, T3FREE, THYROIDAB in the last 72 hours. Anemia Panel: No results for input(s): VITAMINB12, FOLATE, FERRITIN, TIBC, IRON, RETICCTPCT in the last 72 hours. Sepsis Labs: No results for input(s): PROCALCITON, LATICACIDVEN in the last 168 hours.  Recent Results (from the past 240 hour(s))  MRSA PCR Screening     Status: None   Collection Time: 06/21/18  4:32 AM  Result Value Ref Range Status   MRSA by PCR NEGATIVE NEGATIVE Final    Comment:        The GeneXpert MRSA Assay (FDA approved for NASAL specimens only), is one component of a comprehensive MRSA colonization surveillance program. It is not intended to diagnose MRSA infection nor to guide or monitor treatment for MRSA infections. Performed at [facility], [place] [address], [place], [place] [address]       Radiology Studies: Dg Chest 2 View  Result Date: 06/20/2018 CLINICAL DATA:  Cough and dyspnea. Nausea, vomiting and weakness x4 days. EXAM: CHEST - 2 VIEW COMPARISON:  05/19/2018 FINDINGS: There is stable cardiomegaly with aortic atherosclerosis and posterior right moderate pleural effusion. Mild interstitial edema is identified with low lung volumes. No alveolar consolidation. No acute osseous abnormality. IMPRESSION: Stable cardiomegaly with aortic atherosclerosis. Mild interstitial edema and stable posterior right pleural effusion. Electronically Signed   By: Ashley Royalty M.D.   On: 06/20/2018 22:49   Ct Abdomen Pelvis W Contrast  Result Date: 06/21/2018 CLINICAL DATA:  Nausea, vomiting, weakness, and generalized abdominal pain. Rash in  the groin for 3 days. EXAM: CT ABDOMEN AND PELVIS WITH CONTRAST TECHNIQUE: Multidetector CT imaging of the abdomen and pelvis was performed using the standard protocol following bolus administration of intravenous contrast. CONTRAST:  181m ISOVUE-300 IOPAMIDOL (ISOVUE-300) INJECTION 61% COMPARISON:  10/12/2010 FINDINGS: Lower chest: Cardiac enlargement. Small to moderate bilateral pleural effusions with basilar atelectasis. Hepatobiliary: There is prominent reflux of contrast material into the IVC and hepatic veins with dilated patent veins. Heterogeneous parenchymal echotexture. Changes are most consistent with passive hepatic congestion (nutmeg liver). Gallbladder is contracted, likely physiologic. No bile duct dilatation. Pancreas: Unremarkable. No pancreatic ductal dilatation or surrounding inflammatory changes. Spleen: Normal in size without focal abnormality. Adrenals/Urinary Tract: No adrenal gland nodules. Renal nephrograms are symmetrical. Nephrograms are somewhat heterogeneous on the delayed phase which may indicate pyelonephritis. No focal renal lesions. No hydronephrosis or hydroureter. Bladder is unremarkable. Stomach/Bowel: Stomach, small bowel, and colon are not abnormally distended. There is diffuse wall thickening of the small bowel. This is likely due to hypoproteinemia. Enteritis could also have this appearance. Scattered stool throughout the colon. No colonic wall thickening. Appendix is  normal. Vascular/Lymphatic: Aortic atherosclerosis. No enlarged abdominal or pelvic lymph nodes. Reproductive: Uterus and bilateral adnexa are unremarkable. Other: Prominent diffuse abdominal and pelvic ascites. No free air in the abdomen. Abdominal wall musculature appears intact. Diffuse edema throughout the subcutaneous fatty tissues. Musculoskeletal: No acute or significant osseous findings. IMPRESSION: 1. Cardiac enlargement with bilateral pleural effusions and basilar atelectasis. 2. Prominent reflux of  contrast material into the IVC and hepatic veins with dilated patent veins suggesting passive hepatic congestion. 3. Diffuse wall thickening of the small bowel probably due to hypoproteinemia. Nonspecific enteritis also possible. No evidence of bowel obstruction. 4. Diffuse abdominal and pelvic ascites. Diffuse edema throughout the subcutaneous fatty tissues. Electronically Signed   By: Lucienne Capers M.D.   On: 06/21/2018 02:18    Scheduled Meds: . amLODipine  10 mg Oral Daily  . enoxaparin (LOVENOX) injection  30 mg Subcutaneous Q24H  . furosemide  40 mg Intravenous Daily  . nystatin   Topical TID  . sodium chloride flush  3 mL Intravenous Q12H   Continuous Infusions: . sodium chloride       LOS: 0 days    Time spent: Total of 35 minutes spent with pt, greater than 50% of which was spent in discussion of  treatment, counseling and coordination of care   Chipper Oman, MD Pager: Text Page via [email or web address]   If 7PM-7AM, please contact night-coverage [email or web address] 06/22/2018, 5:02 PM   Note - This record has been created using [employer]. Chart creation errors have been sought, but may not always have been located. Such creation errors do not reflect on the standard of medical care.

## 2018-06-23 LAB — BASIC METABOLIC PANEL
Anion gap: 10 (ref 5–15)
BUN: 27 mg/dL — AB (ref 6–20)
CHLORIDE: 101 mmol/L (ref 98–111)
CO2: 25 mmol/L (ref 22–32)
CREATININE: 0.74 mg/dL (ref 0.44–1.00)
Calcium: 9.2 mg/dL (ref 8.9–10.3)
GFR calc Af Amer: >60 mL/min (ref >60)
GFR calc non Af Amer: >60 mL/min (ref >60)
GLUCOSE: 99 mg/dL (ref 70–99)
Potassium: 3.9 mmol/L (ref 3.5–5.1)
Sodium: 136 mmol/L (ref 135–145)

## 2018-06-23 LAB — MAGNESIUM: Magnesium: 1.8 mg/dL (ref 1.7–2.4)

## 2018-06-23 MED ORDER — AMLODIPINE BESYLATE 10 MG PO TABS: 10.0000 mg | ORAL_TABLET | Freq: Every day | ORAL | 0 refills | Status: DC

## 2018-06-23 MED ORDER — LOSARTAN POTASSIUM 25 MG PO TABS: 25.0000 mg | ORAL_TABLET | Freq: Every day | ORAL | 0 refills | Status: DC

## 2018-06-23 MED ORDER — FUROSEMIDE 40 MG PO TABS: 40.0000 mg | ORAL_TABLET | Freq: Every day | ORAL | 0 refills | Status: DC

## 2018-06-23 MED ORDER — FUROSEMIDE 40 MG PO TABS
40.0000 mg | ORAL_TABLET | Freq: Every day | ORAL | Status: DC
  Administered 2018-06-23: 40 mg via ORAL
  Filled 2018-06-23: qty 1

## 2018-06-23 NOTE — Discharge Summary (Signed)
Physician Discharge Summary  [name]  KZS:[id number]  DOB: [birth year]  DOA: 06/20/2018 PCP: Azzie Glatter, FNP  Admit date: 06/20/2018 Discharge date: 06/23/2018  Admitted From: Home  Disposition: Home   Recommendations for Outpatient Follow-up:  1. Follow up with PCP in 1 week 2. Please obtain BMP/CBC in one week to monitor renal function and Hgb 3. Drug abuse rehab recommended   Discharge Condition:Stable   CODE STATUS: Full Code   Diet recommendation: Heart Healthy / Low salt   Brief/Interim Summary: For full details see H&P/Progress note, but in brief, [name] is a 57 year old female with medical history significant for diastolic CHF, hypertension, bilateral AKA, cocaine abuse who presented to the emergency department complaining of shortness of breath.  Patient report has not been taking her medications because "she ran out of her medications ".  Upon ED evaluation BMP of 3000, chest x-ray showed mild pulmonary edema.  Given Lasix with no significant improvement of work of breathing. Patient admitted with working diagnosis of acute on chronic diastolic CHF exacerbation.  Subjective: Patient seen and examined, breathing back to baseline.  Denies chest pain, shortness of breath and palpitation.  Doing significantly better from yesterday.  No acute events overnight.  Continues with good urine output.  Discharge Diagnoses/Hospital Course:  Acute on chronic diastolic CHF Felt to be related to cocaine abuse and noncompliant with medication. She had good diuresis ~12 L of urine output. Weight is down 6 Kg. Echocardiogram on 8/16 showed grade 2 diastolic dysfunction LVEF 60 to 65%, moderate pulmonary hypertension. Patient had clinical improvement diuresed well, and deemed stable for discharge on oral Lasix.  Will continue home doses patient was not taking the medication therefore was not getting any benefit from it.  Continue Lasix 40 mg p.o. daily.  Discuss cocaine cessation.   Encouraged low-salt diet.  Follow-up with PCP.  Hypertension Blood pressure was slightly above goal.  Patient was on amlodipine during hospital stay.  Will add low-dose losartan.  Avoid beta-blocker due to cocaine abuse.  Follow-up with PCP for further BP monitoring.  Tobacco and cocaine abuse  Cessation discussed, recommend outpatient rehab for drug abuse.  All other chronic medical condition were stable during the hospitalization.  On the day of the discharge the patient's vitals were stable, and no other acute medical condition were reported by patient. the patient was felt safe to be discharge to home.   Discharge Instructions  You were cared for by a hospitalist during your hospital stay. If you have any questions about your discharge medications or the care you received while you were in the hospital after you are discharged, you can call the unit and asked to speak with the hospitalist on call if the hospitalist that took care of you is not available. Once you are discharged, your primary care physician will handle any further medical issues. Please note that NO REFILLS for any discharge medications will be authorized once you are discharged, as it is imperative that you return to your primary care physician (or establish a relationship with a primary care physician if you do not have one) for your aftercare needs so that they can reassess your need for medications and monitor your lab values.  Discharge Instructions    (HEART FAILURE PATIENTS) Call MD:  Anytime you have any of the following symptoms: 1) 3 pound weight gain in 24 hours or 5 pounds in 1 week 2) shortness of breath, with or without a dry hacking cough 3)  swelling in the hands, feet or stomach 4) if you have to sleep on extra pillows at night in order to breathe.   Complete by:  As directed    Call MD for:  difficulty breathing, headache or visual disturbances   Complete by:  As directed    Call MD for:  extreme fatigue    Complete by:  As directed    Call MD for:  hives   Complete by:  As directed    Call MD for:  persistant dizziness or light-headedness   Complete by:  As directed    Call MD for:  persistant nausea and vomiting   Complete by:  As directed    Call MD for:  redness, tenderness, or signs of infection (pain, swelling, redness, odor or green/yellow discharge around incision site)   Complete by:  As directed    Call MD for:  severe uncontrolled pain   Complete by:  As directed    Call MD for:  temperature >100.4   Complete by:  As directed    Diet - low sodium heart healthy   Complete by:  As directed    Increase activity slowly   Complete by:  As directed      Allergies as of 06/23/2018      Reactions   Acetaminophen Swelling, Other (See Comments)   Reaction:  Eyelid swelling   Lisinopril    UNSPECIFIED REACTION       Medication List    STOP taking these medications   nicotine 7 mg/24hr patch Commonly known as:  NICODERM CQ - dosed in mg/24 hr   potassium chloride 10 MEQ tablet Commonly known as:  K-DUR     TAKE these medications   amLODipine 10 MG tablet Commonly known as:  NORVASC Take 1 tablet (10 mg total) by mouth daily.   furosemide 40 MG tablet Commonly known as:  LASIX Take 1 tablet (40 mg total) by mouth daily.   losartan 25 MG tablet Commonly known as:  COZAAR Take 1 tablet (25 mg total) by mouth daily.      Follow-up Information    Azzie Glatter, FNP. Schedule an appointment as soon as possible for a visit in 1 week(s).   Specialty:  Family Medicine Why:  Hospital follow-up Contact information: [address] [place] [address] [phone]          Allergies  Allergen Reactions  . Acetaminophen Swelling and Other (See Comments)    Reaction:  Eyelid swelling  . Lisinopril     UNSPECIFIED REACTION     Consultations:  None    Procedures/Studies: Dg Chest 2 View  Result Date: 06/20/2018 CLINICAL DATA:  Cough and dyspnea.  Nausea, vomiting and weakness x4 days. EXAM: CHEST - 2 VIEW COMPARISON:  05/19/2018 FINDINGS: There is stable cardiomegaly with aortic atherosclerosis and posterior right moderate pleural effusion. Mild interstitial edema is identified with low lung volumes. No alveolar consolidation. No acute osseous abnormality. IMPRESSION: Stable cardiomegaly with aortic atherosclerosis. Mild interstitial edema and stable posterior right pleural effusion. Electronically Signed   By: Ashley Royalty M.D.   On: 06/20/2018 22:49   Ct Abdomen Pelvis W Contrast  Result Date: 06/21/2018 CLINICAL DATA:  Nausea, vomiting, weakness, and generalized abdominal pain. Rash in the groin for 3 days. EXAM: CT ABDOMEN AND PELVIS WITH CONTRAST TECHNIQUE: Multidetector CT imaging of the abdomen and pelvis was performed using the standard protocol following bolus administration of intravenous contrast. CONTRAST:  111m ISOVUE-300 IOPAMIDOL (  ISOVUE-300) INJECTION 61% COMPARISON:  10/12/2010 FINDINGS: Lower chest: Cardiac enlargement. Small to moderate bilateral pleural effusions with basilar atelectasis. Hepatobiliary: There is prominent reflux of contrast material into the IVC and hepatic veins with dilated patent veins. Heterogeneous parenchymal echotexture. Changes are most consistent with passive hepatic congestion (nutmeg liver). Gallbladder is contracted, likely physiologic. No bile duct dilatation. Pancreas: Unremarkable. No pancreatic ductal dilatation or surrounding inflammatory changes. Spleen: Normal in size without focal abnormality. Adrenals/Urinary Tract: No adrenal gland nodules. Renal nephrograms are symmetrical. Nephrograms are somewhat heterogeneous on the delayed phase which may indicate pyelonephritis. No focal renal lesions. No hydronephrosis or hydroureter. Bladder is unremarkable. Stomach/Bowel: Stomach, small bowel, and colon are not abnormally distended. There is diffuse wall thickening of the small bowel. This is likely due  to hypoproteinemia. Enteritis could also have this appearance. Scattered stool throughout the colon. No colonic wall thickening. Appendix is normal. Vascular/Lymphatic: Aortic atherosclerosis. No enlarged abdominal or pelvic lymph nodes. Reproductive: Uterus and bilateral adnexa are unremarkable. Other: Prominent diffuse abdominal and pelvic ascites. No free air in the abdomen. Abdominal wall musculature appears intact. Diffuse edema throughout the subcutaneous fatty tissues. Musculoskeletal: No acute or significant osseous findings. IMPRESSION: 1. Cardiac enlargement with bilateral pleural effusions and basilar atelectasis. 2. Prominent reflux of contrast material into the IVC and hepatic veins with dilated patent veins suggesting passive hepatic congestion. 3. Diffuse wall thickening of the small bowel probably due to hypoproteinemia. Nonspecific enteritis also possible. No evidence of bowel obstruction. 4. Diffuse abdominal and pelvic ascites. Diffuse edema throughout the subcutaneous fatty tissues. Electronically Signed   By: Lucienne Capers M.D.   On: 06/21/2018 02:18    Discharge Exam: Vitals:   06/22/18 2135 06/23/18 0524  BP: (!) 144/85 (!) 159/89  Pulse: 95 90  Resp:  16  Temp:  98.7 F (37.1 C)  SpO2:  100%   Vitals:   06/22/18 1259 06/22/18 2134 06/22/18 2135 06/23/18 0524  BP: (!) 136/96 (!) 173/97 (!) 144/85 (!) 159/89  Pulse: 97 97 95 90  Resp:  16  16  Temp:  98.9 F (37.2 C)  98.7 F (37.1 C)  TempSrc:  Oral  Oral  SpO2: 100% 100%  100%  Weight:    35.4 kg  Height:        General: NAD  Cardiovascular: RRR, S1/S2 +, no rubs, no gallops Respiratory: CTA bilaterally, no wheezing, no rhonchi Abdominal: Soft, NT, ND, bowel sounds + Extremities: B/L AKA    The results of significant diagnostics from this hospitalization (including imaging, microbiology, ancillary and laboratory) are listed below for reference.     Microbiology: Recent Results (from the past 240  hour(s))  MRSA PCR Screening     Status: None   Collection Time: 06/21/18  4:32 AM  Result Value Ref Range Status   MRSA by PCR NEGATIVE NEGATIVE Final    Comment:        The GeneXpert MRSA Assay (FDA approved for NASAL specimens only), is one component of a comprehensive MRSA colonization surveillance program. It is not intended to diagnose MRSA infection nor to guide or monitor treatment for MRSA infections. Performed at [facility], [place] [address] [place],  [address]      Labs: BNP (last 3 results) Recent Labs    05/19/18 2116 06/20/18 2239  BNP 3,603.1* 3,419.6*   Basic Metabolic Panel: Recent Labs  Lab 06/20/18 2239 06/22/18 0516 06/23/18 0501  NA 136 135 136  K 3.4* 4.5 3.9  CL  106 101 101  CO2 19* 23 25  GLUCOSE 117* 88 99  BUN 16 19 27*  CREATININE 0.83 0.86 0.74  CALCIUM 8.5* 8.8* 9.2  MG  --   --  1.8   Liver Function Tests: Recent Labs  Lab 06/20/18 2239  AST 27  ALT 21  ALKPHOS 108  BILITOT 1.5*  PROT 6.3*  ALBUMIN 3.2*   Recent Labs  Lab 06/20/18 2239  LIPASE 30   No results for input(s): AMMONIA in the last 168 hours. CBC: Recent Labs  Lab 06/20/18 2239  WBC 6.5  NEUTROABS 4.0  HGB 11.1*  HCT 34.8*  MCV 84.1  PLT 245   Cardiac Enzymes: Recent Labs  Lab 06/21/18 0527 06/21/18 0925 06/21/18 1510  TROPONINI 0.06* 0.05* 0.05*   BNP: Invalid input(s): POCBNP CBG: No results for input(s): GLUCAP in the last 168 hours. D-Dimer No results for input(s): DDIMER in the last 72 hours. Hgb A1c No results for input(s): HGBA1C in the last 72 hours. Lipid Profile No results for input(s): CHOL, HDL, LDLCALC, TRIG, CHOLHDL, LDLDIRECT in the last 72 hours. Thyroid function studies No results for input(s): TSH, T4TOTAL, T3FREE, THYROIDAB in the last 72 hours.  Invalid input(s): FREET3 Anemia work up No results for input(s): VITAMINB12, FOLATE, FERRITIN, TIBC, IRON, RETICCTPCT in the last 72  hours. Urinalysis    Component Value Date/Time   COLORURINE YELLOW 06/20/2018 2239   APPEARANCEUR CLEAR 06/20/2018 2239   LABSPEC 1.016 06/20/2018 2239   PHURINE 6.0 06/20/2018 2239   GLUCOSEU NEGATIVE 06/20/2018 2239   HGBUR SMALL (A) 06/20/2018 2239   BILIRUBINUR NEGATIVE 06/20/2018 2239   BILIRUBINUR small 04/29/2015 1533   KETONESUR NEGATIVE 06/20/2018 2239   PROTEINUR 30 (A) 06/20/2018 2239   UROBILINOGEN 1.0 07/26/2017 1550   NITRITE NEGATIVE 06/20/2018 2239   LEUKOCYTESUR NEGATIVE 06/20/2018 2239   Sepsis Labs Invalid input(s): PROCALCITONIN,  WBC,  LACTICIDVEN Microbiology Recent Results (from the past 240 hour(s))  MRSA PCR Screening     Status: None   Collection Time: 06/21/18  4:32 AM  Result Value Ref Range Status   MRSA by PCR NEGATIVE NEGATIVE Final    Comment:        The GeneXpert MRSA Assay (FDA approved for NASAL specimens only), is one component of a comprehensive MRSA colonization surveillance program. It is not intended to diagnose MRSA infection nor to guide or monitor treatment for MRSA infections. Performed at [facility], [place] [address] [place], [place] [address]      Time coordinating discharge: 32 minutes  SIGNED:  Chipper Oman, MD  [facility] 06/23/2018, 11:21 AM  Pager please text page via  [email or web address]  Note - This record has been created using [employer]. Chart creation errors have been sought, but may not always have been located. Such creation errors do not reflect on the standard of medical care.

## 2018-06-23 NOTE — Progress Notes (Signed)
[facility]. Discharge teaching including Medications and schedules reviewed with Pt. Pt verbalized understanding, Discharge packet with AVS with Pt at time of discharge.

## 2018-06-23 NOTE — Care Management Note (Signed)
Case Management Note  Patient Details  Name: [name] MRN: [id number] Date of Birth: Mar 05, 1961  Subjective/Objective:Bilateral AKA.Partnership for community care-closed the case since patient doesn't have a phone, & her brother who is the contact-doesn't answer the phone. Patient has w/c,& support @ boarding house, has w/c @ home. Will transport by [facility]-forms in shdow chart for Nsg to call [facility] when ready. No further CM needs.                    Action/Plan:dc back to boarding house by [facility].   Expected Discharge Date:  06/23/18               Expected Discharge Plan:  Home/Self Care  In-House Referral:  Clinical Social Work  Discharge planning Services  CM Consult  Post Acute Care Choice:  Durable Medical Equipment, Resumption of Svcs/PTA Provider(w/c;custodial level care) Choice offered to:     DME Arranged:    DME Agency:     HH Arranged:    HH Agency:     Status of Service:  Completed, signed off  If discussed at [employer], dates discussed:    Additional Comments:  Dessa Phi, RN 06/23/2018, 11:25 AM

## 2018-07-09 ENCOUNTER — Inpatient Hospital Stay (HOSPITAL_COMMUNITY)
Admission: EM | Admit: 2018-07-09 | Discharge: 2018-07-13 | DRG: 546 | Disposition: A | Discharge status: Home / Self Care | Payer: Medicaid Other | Attending: Internal Medicine | Admitting: Internal Medicine

## 2018-07-09 DIAGNOSIS — I5032 Chronic diastolic (congestive) heart failure: Secondary | ICD-10-CM | POA: Diagnosis present

## 2018-07-09 DIAGNOSIS — F1420 Cocaine dependence, uncomplicated: Secondary | ICD-10-CM | POA: Diagnosis present

## 2018-07-09 DIAGNOSIS — T374X5A Adverse effect of anthelminthics, initial encounter: Secondary | ICD-10-CM | POA: Diagnosis present

## 2018-07-09 DIAGNOSIS — D649 Anemia, unspecified: Secondary | ICD-10-CM | POA: Diagnosis present

## 2018-07-09 DIAGNOSIS — F1424 Cocaine dependence with cocaine-induced mood disorder: Secondary | ICD-10-CM | POA: Diagnosis present

## 2018-07-09 DIAGNOSIS — Z89612 Acquired absence of left leg above knee: Secondary | ICD-10-CM

## 2018-07-09 DIAGNOSIS — I110 Hypertensive heart disease with heart failure: Secondary | ICD-10-CM | POA: Diagnosis present

## 2018-07-09 DIAGNOSIS — I160 Hypertensive urgency: Secondary | ICD-10-CM | POA: Diagnosis present

## 2018-07-09 DIAGNOSIS — F17210 Nicotine dependence, cigarettes, uncomplicated: Secondary | ICD-10-CM | POA: Diagnosis present

## 2018-07-09 DIAGNOSIS — Z89611 Acquired absence of right leg above knee: Secondary | ICD-10-CM

## 2018-07-09 DIAGNOSIS — R21 Rash and other nonspecific skin eruption: Secondary | ICD-10-CM | POA: Diagnosis present

## 2018-07-09 DIAGNOSIS — Z89511 Acquired absence of right leg below knee: Secondary | ICD-10-CM

## 2018-07-09 DIAGNOSIS — R740 Nonspecific elevation of levels of transaminase and lactic acid dehydrogenase [LDH]: Secondary | ICD-10-CM | POA: Diagnosis present

## 2018-07-09 DIAGNOSIS — Z89512 Acquired absence of left leg below knee: Secondary | ICD-10-CM

## 2018-07-09 DIAGNOSIS — I776 Arteritis, unspecified: Principal | ICD-10-CM | POA: Diagnosis present

## 2018-07-09 DIAGNOSIS — Z811 Family history of alcohol abuse and dependence: Secondary | ICD-10-CM

## 2018-07-09 DIAGNOSIS — E440 Moderate protein-calorie malnutrition: Secondary | ICD-10-CM | POA: Diagnosis present

## 2018-07-09 DIAGNOSIS — M069 Rheumatoid arthritis, unspecified: Secondary | ICD-10-CM | POA: Diagnosis present

## 2018-07-09 DIAGNOSIS — F329 Major depressive disorder, single episode, unspecified: Secondary | ICD-10-CM | POA: Diagnosis present

## 2018-07-09 DIAGNOSIS — Z8701 Personal history of pneumonia (recurrent): Secondary | ICD-10-CM

## 2018-07-09 DIAGNOSIS — I739 Peripheral vascular disease, unspecified: Secondary | ICD-10-CM | POA: Diagnosis present

## 2018-07-09 DIAGNOSIS — Z803 Family history of malignant neoplasm of breast: Secondary | ICD-10-CM

## 2018-07-09 DIAGNOSIS — Z681 Body mass index (BMI) 19 or less, adult: Secondary | ICD-10-CM

## 2018-07-09 DIAGNOSIS — X58XXXA Exposure to other specified factors, initial encounter: Secondary | ICD-10-CM | POA: Diagnosis present

## 2018-07-09 DIAGNOSIS — R45851 Suicidal ideations: Secondary | ICD-10-CM | POA: Diagnosis present

## 2018-07-09 DIAGNOSIS — Z590 Homelessness: Secondary | ICD-10-CM

## 2018-07-09 DIAGNOSIS — F1414 Cocaine abuse with cocaine-induced mood disorder: Secondary | ICD-10-CM | POA: Diagnosis present

## 2018-07-09 DIAGNOSIS — Z800 Family history of malignant neoplasm of digestive organs: Secondary | ICD-10-CM

## 2018-07-09 DIAGNOSIS — Z888 Allergy status to other drugs, medicaments and biological substances status: Secondary | ICD-10-CM

## 2018-07-09 DIAGNOSIS — I10 Essential (primary) hypertension: Secondary | ICD-10-CM | POA: Diagnosis present

## 2018-07-09 IMAGING — MR MR FOOT*R* WO/W CM
5 of 10 series · 22 of 40 positions shown · IV contrast (multihance)
Comparison: 06/14/2016

CLINICAL DATA: Nonhealing ulcers of the right lower leg, ankle, and
especially the calf.

EXAM:
MRI OF THE RIGHT FOREFOOT WITHOUT AND WITH CONTRAST
TECHNIQUE: Multiplanar, multisequence MR imaging was performed both before and
after administration of intravenous contrast.
CONTRAST:  8 cc MultiHance

[Series 5: T1 · oblique · 3.0mm · 0.27mm/px · 5 of 36 slices shown (1 of 2)]
[im 1/36]
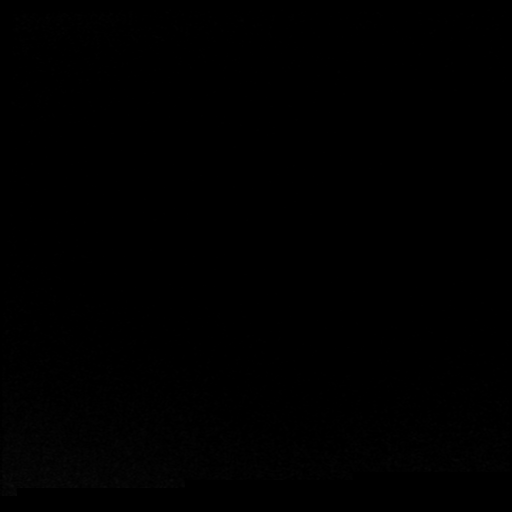
[im 9/36]
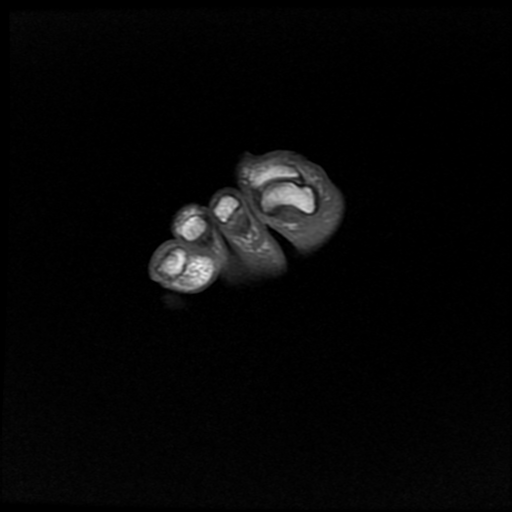
[im 18/36]
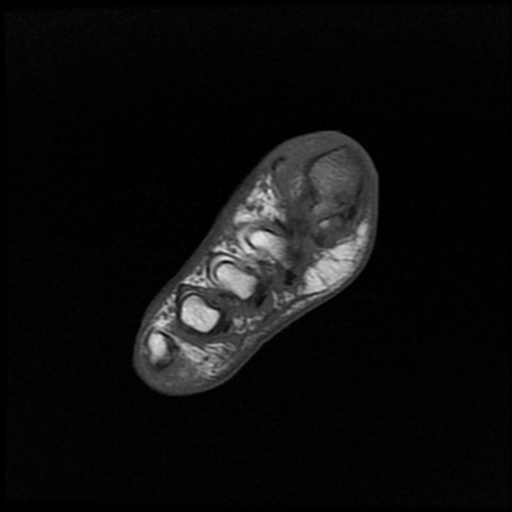
[im 27/36]
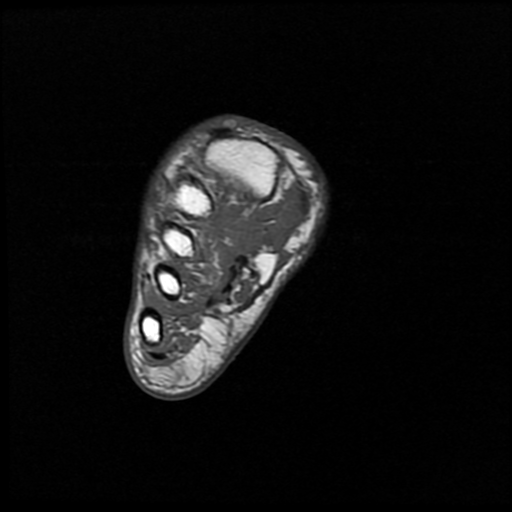
[im 36/36]
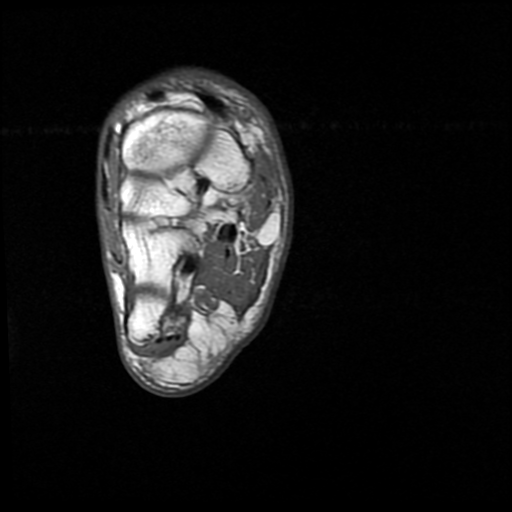

[Series 6: T2 fat-sat · oblique · 3.0mm · 0.55mm/px · 5 of 36 slices shown]
[im 1/36]
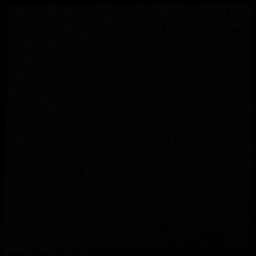
[im 9/36]
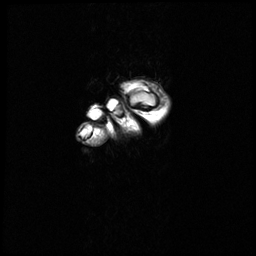
[im 18/36]
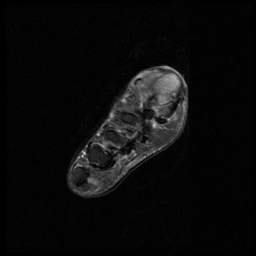
[im 27/36]
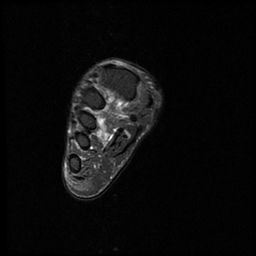
[im 36/36]
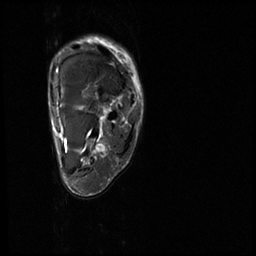

[Series 7: T1 fat-sat · oblique · non-contrast · 3.0mm · 0.27mm/px · 5 of 36 slices shown (1 of 2)]
[im 1/36]
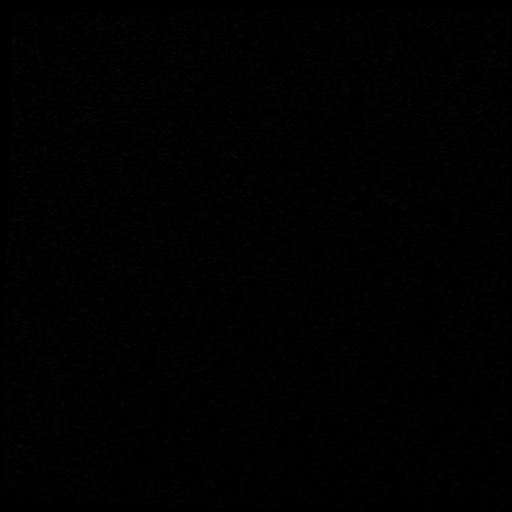
[im 9/36]
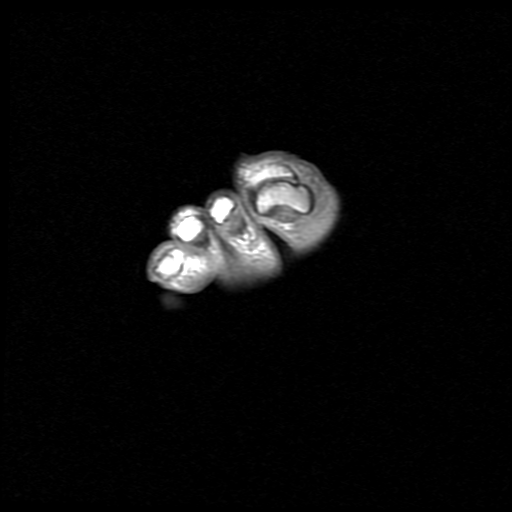
[im 18/36]
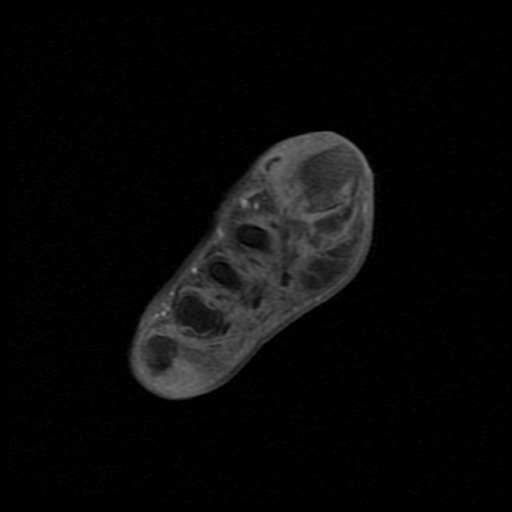
[im 27/36]
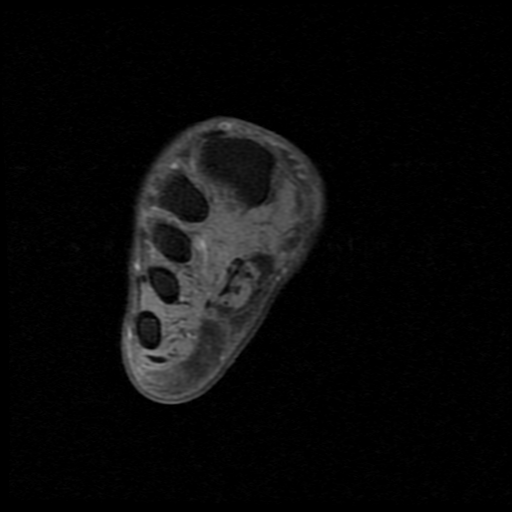
[im 36/36]
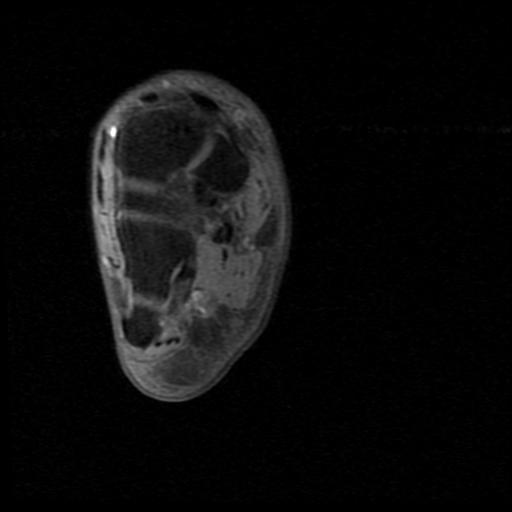

[Series 9: T1 · oblique · 3.0mm · 0.27mm/px · 3 of 19 slices shown (2 of 2)]
[im 1/19]
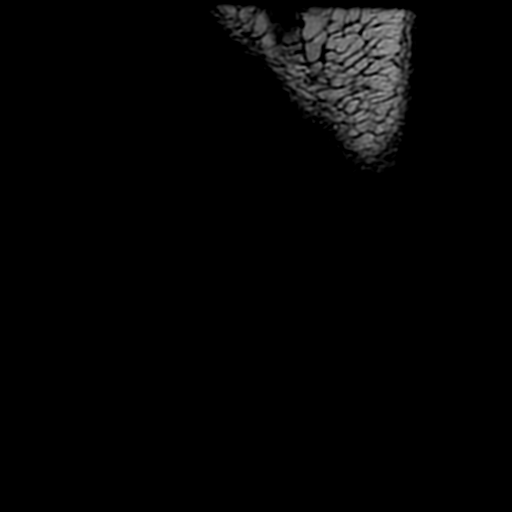
[im 10/19]
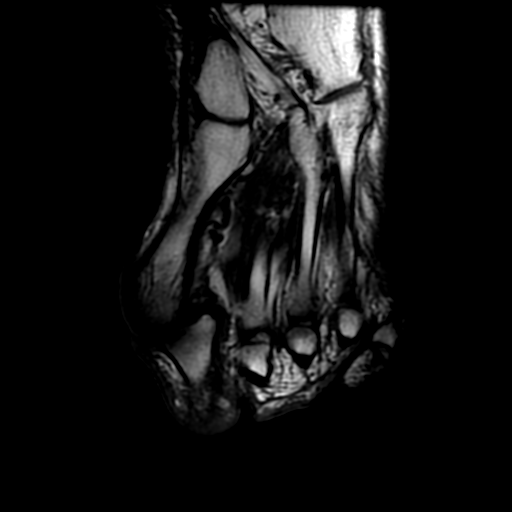
[im 19/19]
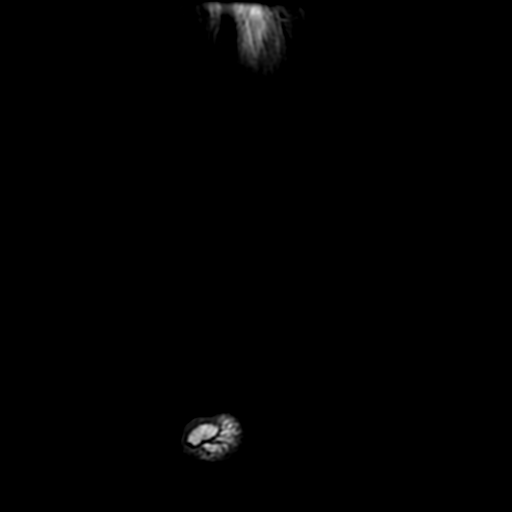

[Series 10: T1 fat-sat · oblique · 3.0mm · 0.27mm/px · 4 of 36 slices shown (2 of 2)]
[im 1/36]
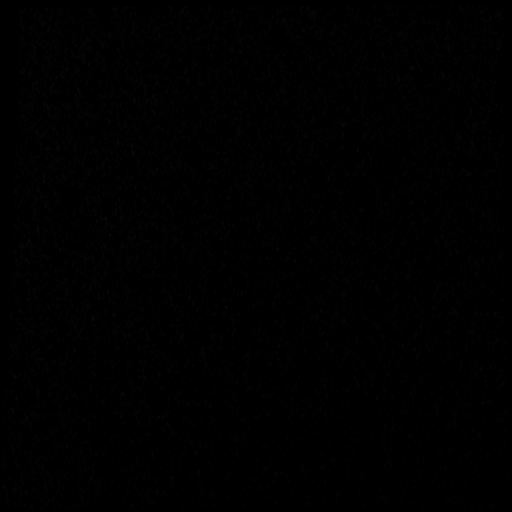
[im 9/36]
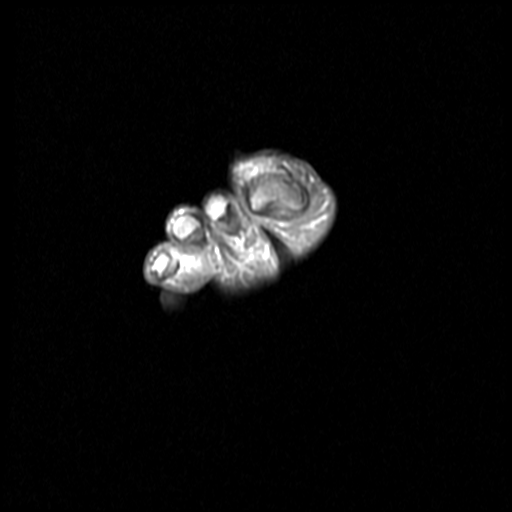
[im 18/36]
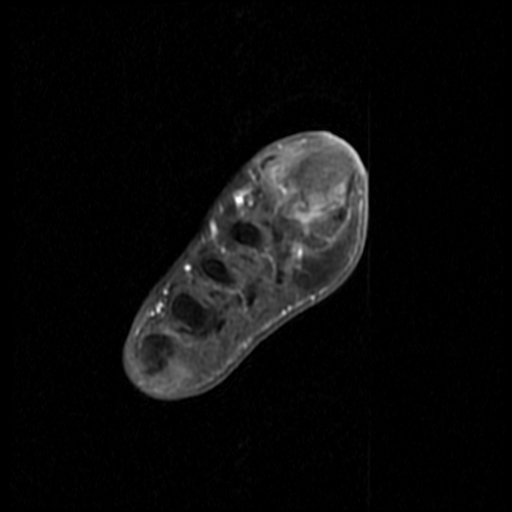
[im 27/36]
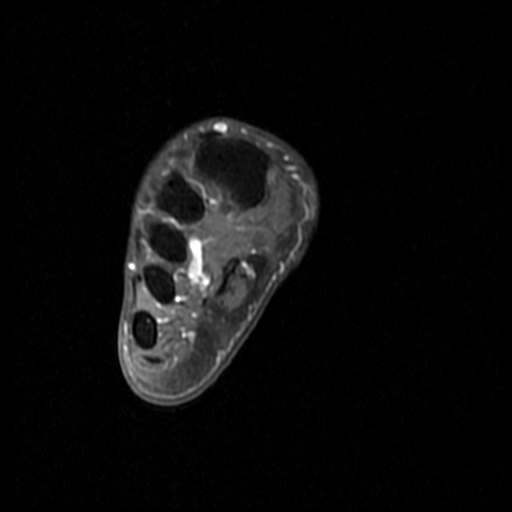

[22 of 40 positions shown; findings below may reference images not displayed]

FINDINGS: Hallux valgus with lateral subluxation of the proximal phalanx of
the great toe with respect to the first metatarsal. Severe thinning
of the skin overlying the first MTP joint, possibly with a draining
ulcer extending to the joint. Appearance concerning for draining
septic arthritis with osteomyelitis of the distal first metatarsal
and proximal phalanx first digit. This also corresponds to the known
site of ulceration.

Lisfranc ligament intact. The remaining metatarsals and phalanges
appear intact as does the visualize part of the midfoot.

Despite efforts by the technologist and patient, motion artifact is
present on today's exam and could not be eliminated. This reduces
exam sensitivity and specificity.

Subcutaneous edema noted along the lateral ball of the foot. There
is also edema plantar to the second and third MTP joints in the
subcutaneous tissues.
IMPRESSION: 1. Ulceration medially overlying the first MTP joint, with possible
drainage of the joint along the ulceration given the severity of
thinning of the overlying scan. There is hallux valgus with lateral
displacement of the proximal phalanx on the first metatarsal head,
an abnormal edema and enhancement in the distal first metatarsal and
proximal phalanx great toe suspicious for osteomyelitis.

## 2018-07-09 IMAGING — MR MR [PERSON_NAME] LOW WO/W CM*R*
7 of 12 series · 18 of 40 positions shown · IV contrast (Yes MH)
Comparison: 06/14/2016 and radiographs from 07/25/2016

CLINICAL DATA: Severe nonhealing ulcerations along the right
posterior calf. Ulceration along the left posterior calf. Vascular
disorder.

EXAM:
MRI OF LOWER RIGHT TIBIA/FIBULA WITHOUT AND WITH CONTRAST
MRI OF THE LEFT TIBIA/FIBULA WITH AND WITHOUT CONTRAST
TECHNIQUE: Multiplanar, multisequence MR imaging of the right calf was
performed both before and after administration of intravenous
contrast.
Multiplanar, multisequence MR imaging of the left calf was performed
both before and after administration of intravenous contrast.
CONTRAST:  8 cc MultiHance

[Series 3: T1 fat-sat post-contrast · coronal · 5.0mm · 0.78mm/px · 2 of 15 slices shown]
[im 1/15]
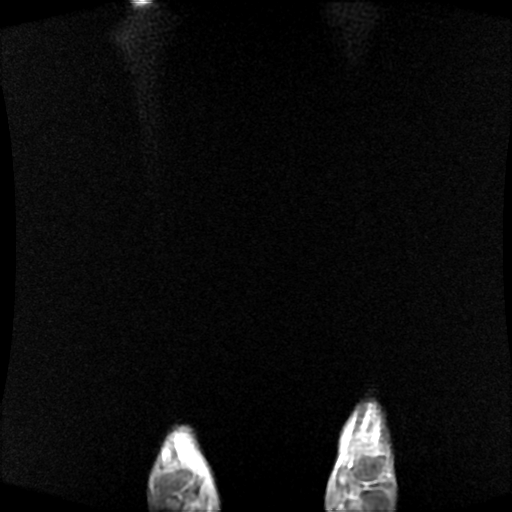
[im 15/15]
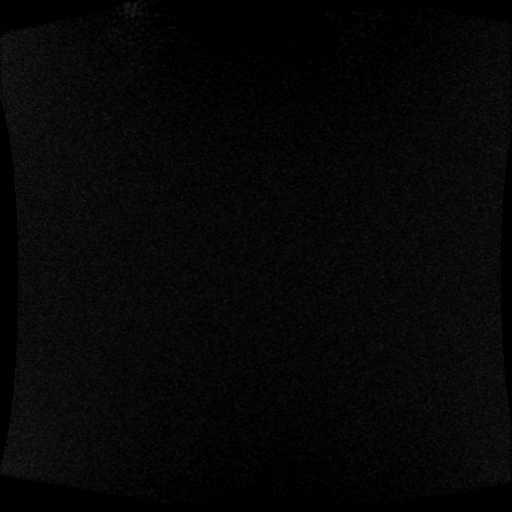

[Series 4: T1 fat-sat · sagittal · 4.0mm · 0.78mm/px · 2 of 18 slices shown (1 of 3)]
[im 1/18]
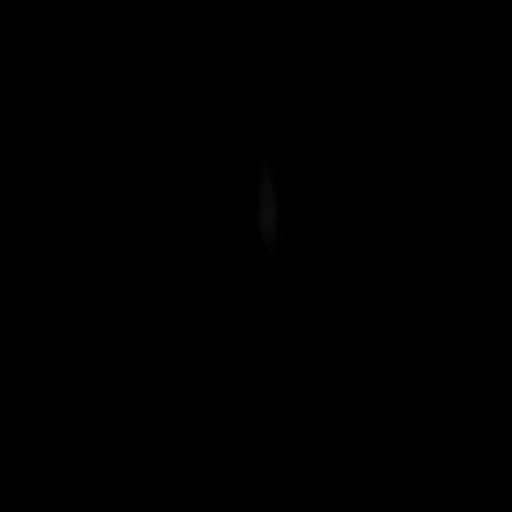
[im 18/18]
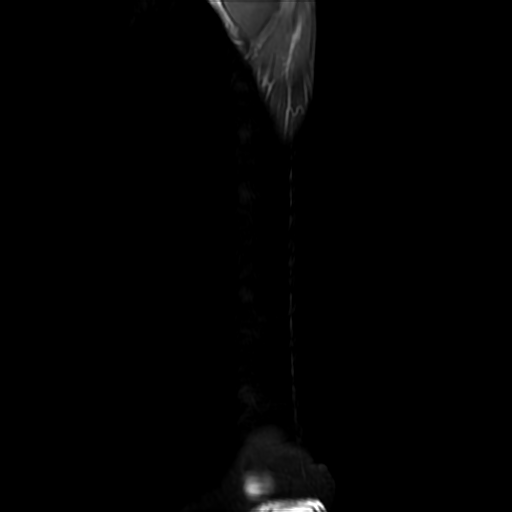

[Series 4: T1 · coronal · 5.0mm · 0.78mm/px · 2 of 15 slices shown (1 of 3)]
[im 1/15]
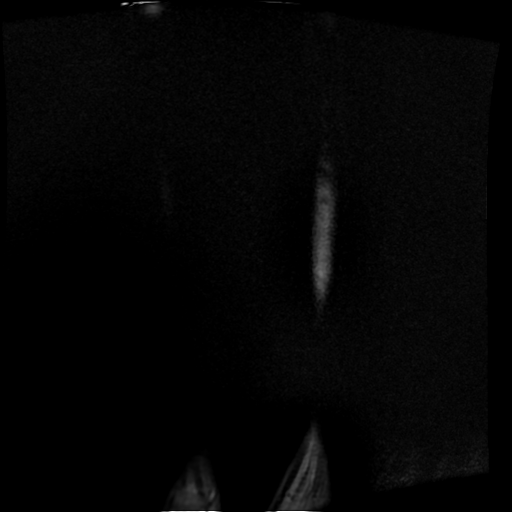
[im 15/15]
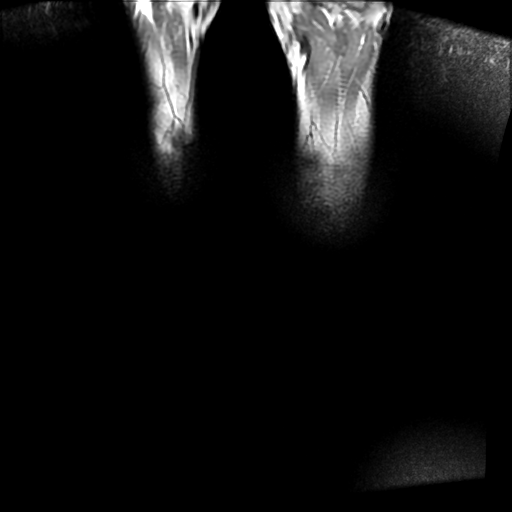

[Series 5: T1 fat-sat · sagittal · 4.0mm · 0.78mm/px · 2 of 18 slices shown (2 of 3)]
[im 1/18]
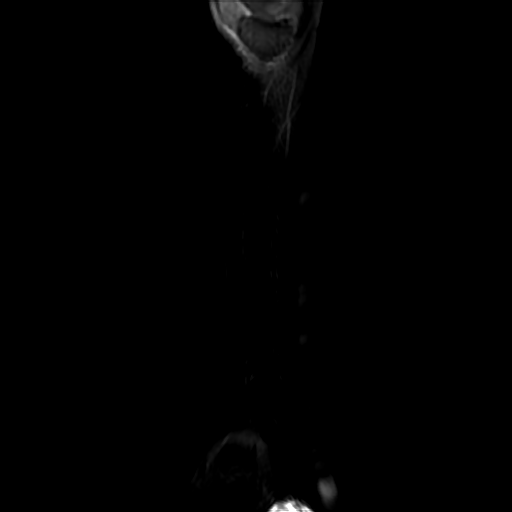
[im 18/18]
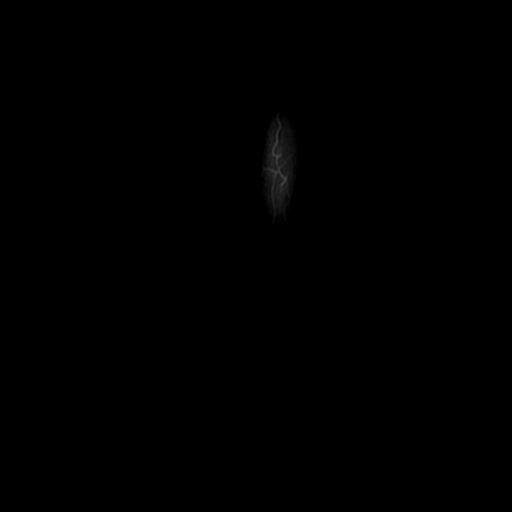

[Series 5: T1 · sagittal · 3.0mm · 0.78mm/px · 2 of 22 slices shown (2 of 3)]
[im 1/22]
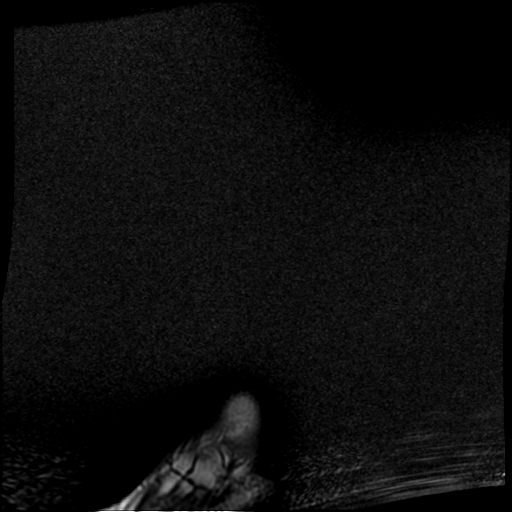
[im 22/22]
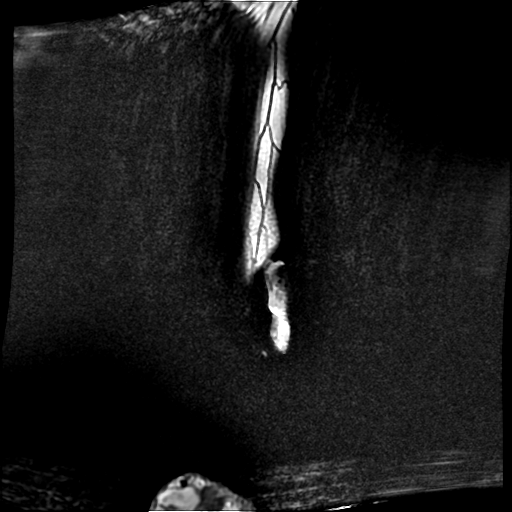

[Series 6: T1 fat-sat · axial · 5.0mm · 0.55mm/px · z∈[-269,+70]mm · 7 of 70 slices shown (3 of 3)]
[im 1/70]
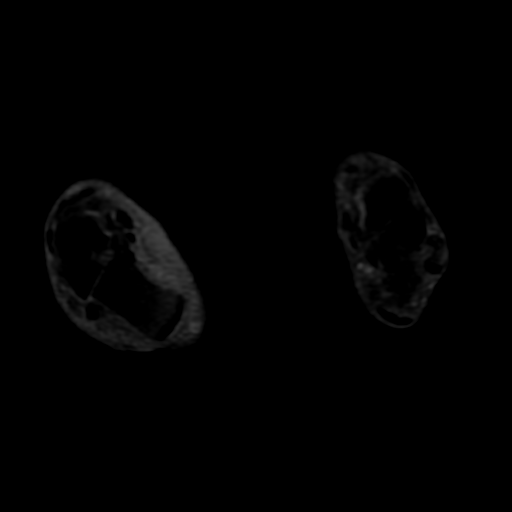
[im 12/70]
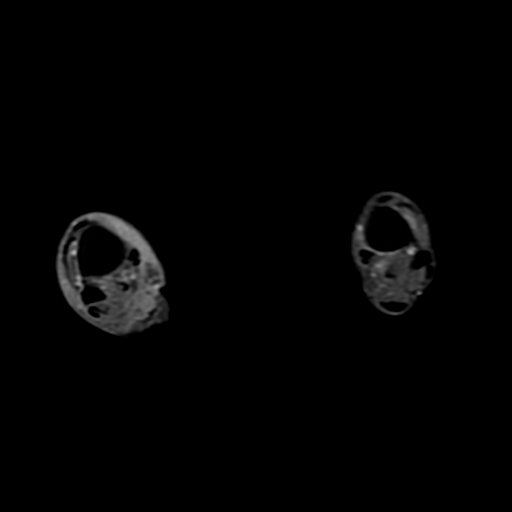
[im 24/70]
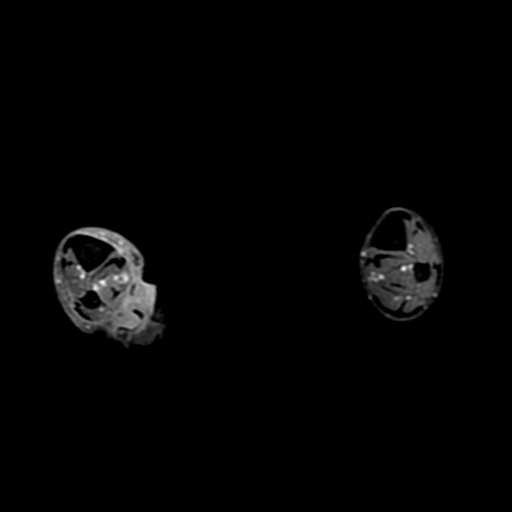
[im 35/70]
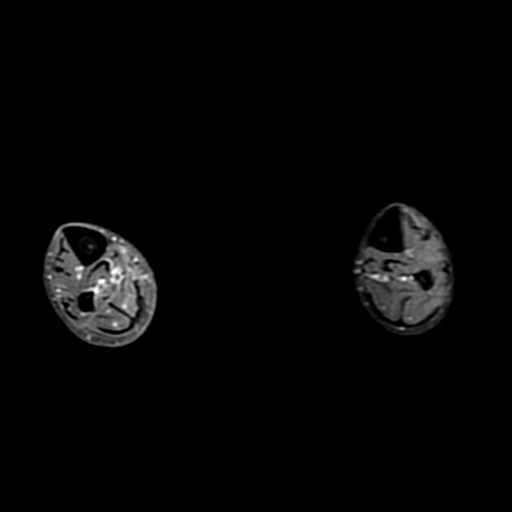
[im 47/70]
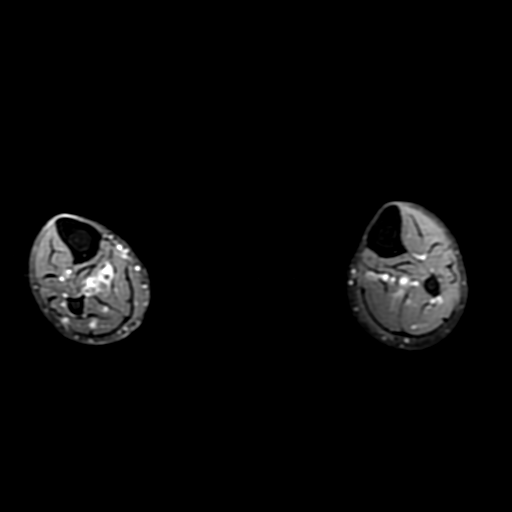
[im 58/70]
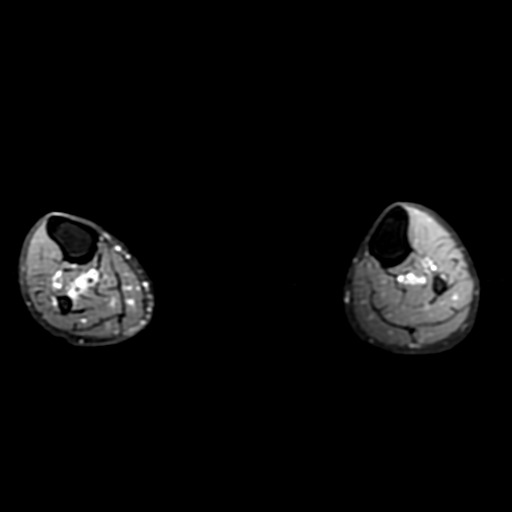
[im 70/70]
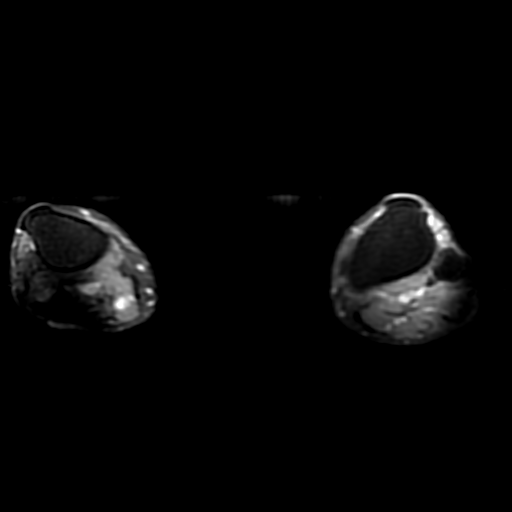

[Series 7: T1 · axial · 5.0mm · 0.47mm/px · 1 of 60 slices shown (3 of 3)]
[im 1/60]
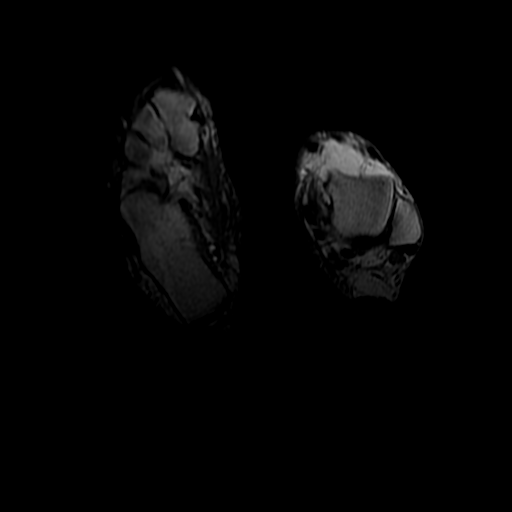

[18 of 40 positions shown; findings below may reference images not displayed]

FINDINGS: Right calf:

Markedly severe ulceration and soft tissue defect in the distal
third of the right calf extending down to the superficial fascia all
muscle layers of the soleus medially, and with irregular ulceration
posterolaterally as well. There is abnormal edema signal within the
soleus muscle am within the flexor hallucis longus muscle indicative
of myositis and fasciitis. I do not see a drainable abscess tracking
deep to the ulceration. There is considerable cellulitis along the
lower margin of the ulceration, extending superficial to the medial
malleolus. The region of ulceration extends down to the calcaneus
and tracks around the Achilles tendon, total length 15.7 cm. There
is tendinopathy along the distal gastrocnemius tendons and soleus
tendon.

I do not observe underlying osteomyelitis of the right tibia/fibula.

Left calf:

The findings on the left are less severe than on the right, with a
approximately 3 cm ulceration posteromedially in the subcutaneous
tissues, along the posteromedial margin of the medial flexor
tendons, questionably exposing the medial margin of the distal
soleus muscle. This does not appear to erode into the tarsal tunnel
and there is relatively mild surrounding cellulitis. No significant
associated myositis. No drainable abscess. No findings of left
tibial or fibular osteomyelitis.
IMPRESSION: 1. Markedly severe cutaneous and subcutaneous ulceration of the
right distal calf over a 15.7 cm excursion, extending around the
soleus muscle and Achilles tendon, with lack of overlying
subcutaneous and cutaneous tissues along portions of the muscle
beds. There is abnormal myositis involving the soleus and flexor
hallucis longus muscles with some underlying fasciitis, but no
drainable abscess or osteomyelitis. Surrounding cellulitis noted.
2. 3 cm ulceration posteromedially in the distal subcutaneous
tissues of the left calf. Very minimal surrounding cellulitis
without drainable abscess or osteomyelitis of the left tibia/fibula.

## 2018-07-09 IMAGING — DX DG TIBIA/FIBULA 2V*R*
4 series · 4 of 4 positions shown · non-contrast
Comparison: MRI of the right tibia/fibula performed 06/14/2016

CLINICAL DATA: Chronic large wound at the posterior calf, with
necrosis. Initial encounter.

EXAM:
RIGHT TIBIA AND FIBULA - 2 VIEW

[tibia ap (1 of 2)]
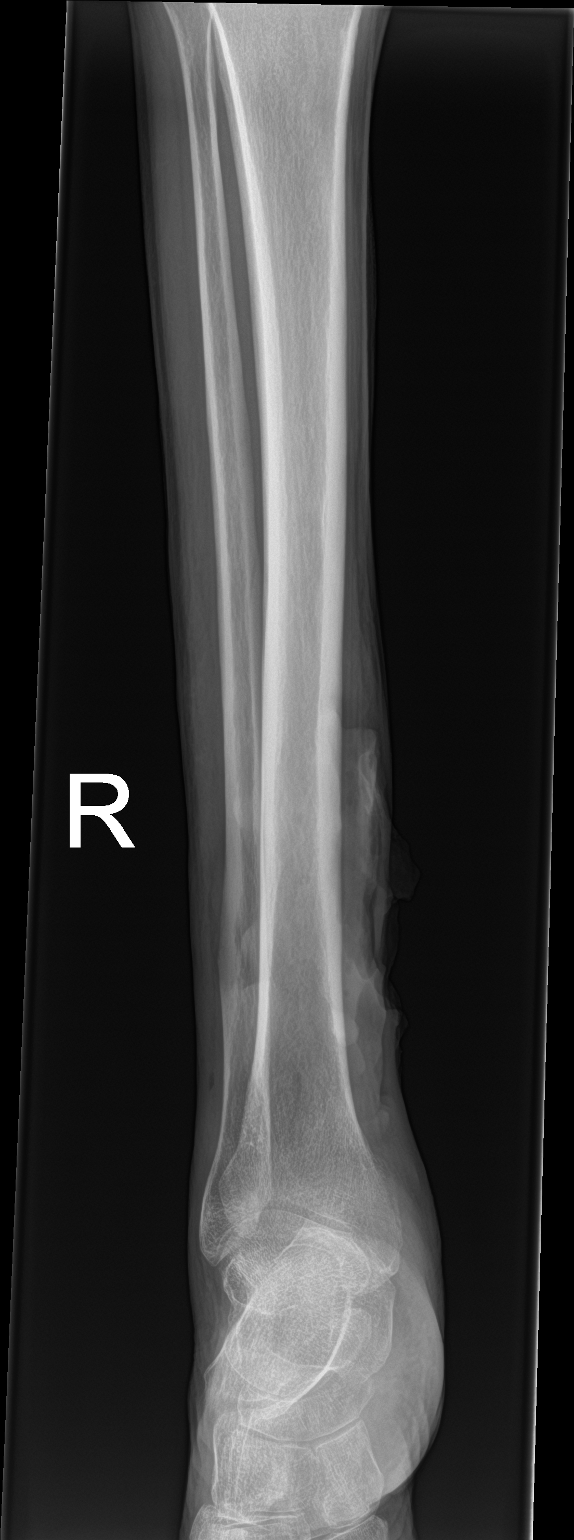

[tibia ap (2 of 2)]
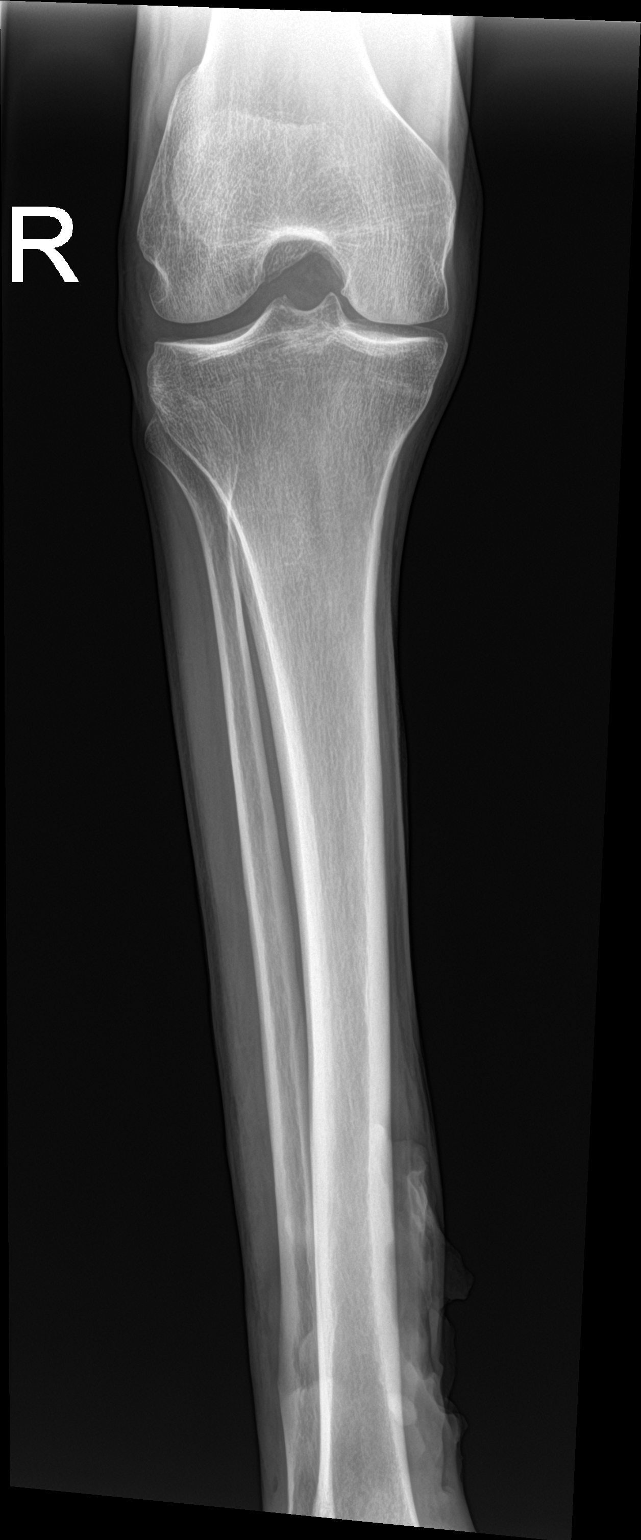

[tibia lat (1 of 2)]
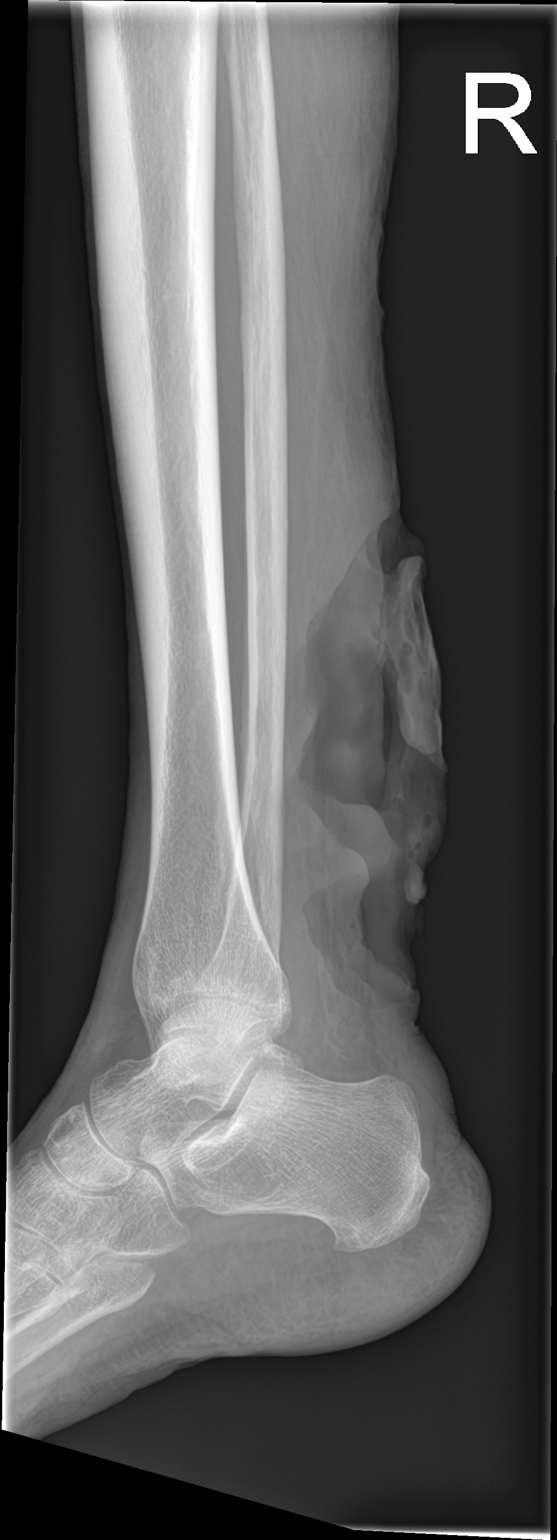

[tibia lat (2 of 2)]
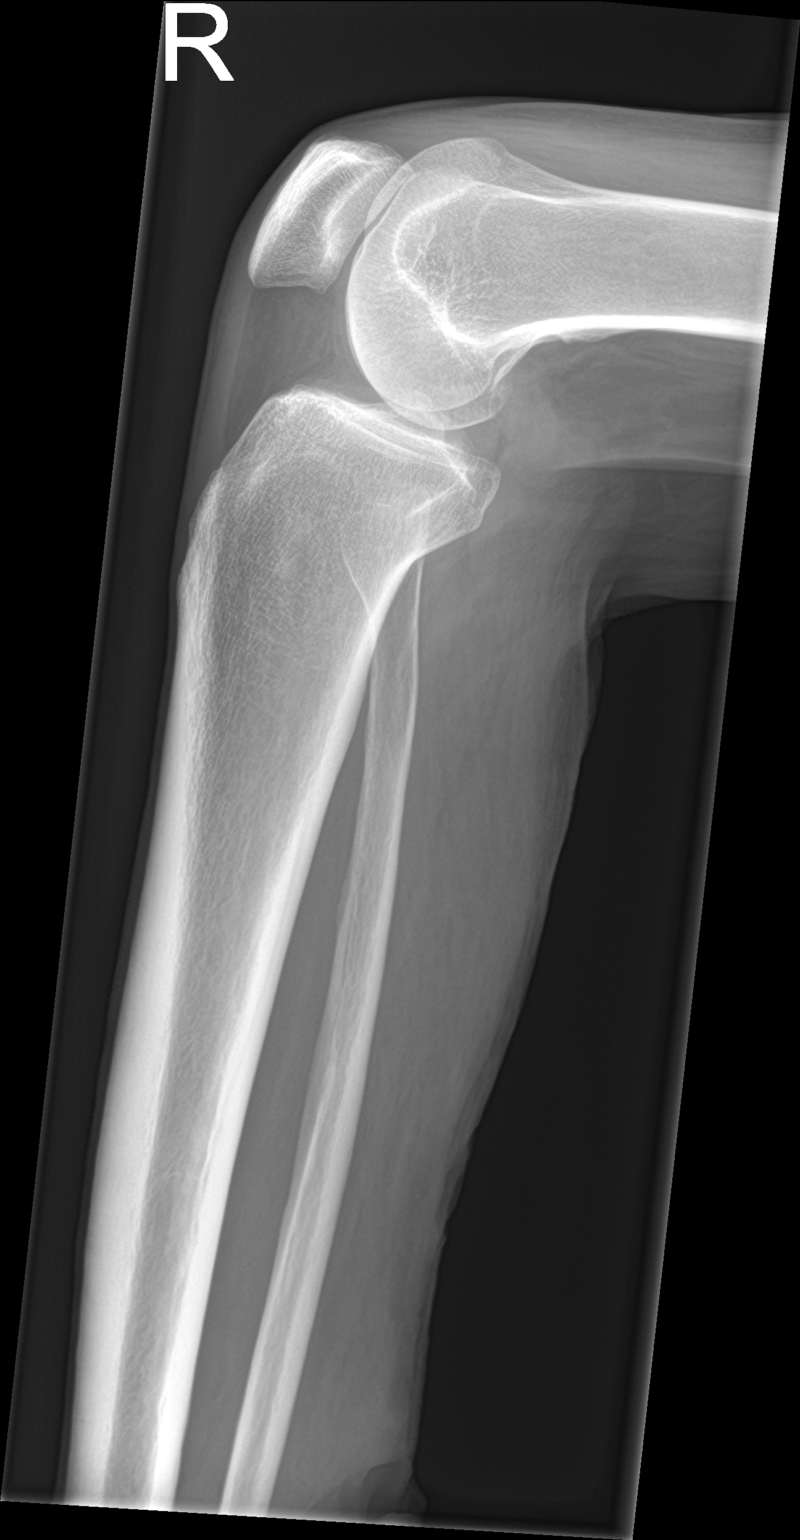

[4 of 4 positions shown; findings below may reference images not displayed]

FINDINGS: There is no evidence of fracture or dislocation. No definite osseous
erosions are seen.

A very large soft tissue wound is noted at the right posterior lower
leg. No radiopaque foreign bodies are seen.

The ankle joint is grossly unremarkable in appearance. The knee
joint is unremarkable in appearance. No knee joint effusion is seen.
IMPRESSION: No evidence of fracture or dislocation. No definite osseous erosions
seen. Very large soft tissue wound at the right posterior lower leg.

## 2018-07-09 IMAGING — MR MR [PERSON_NAME] LOW WO/W CM*R*
2 series · 18 of 36 positions shown · IV contrast (multihance)
Comparison: 06/14/2016 and radiographs from 07/25/2016

CLINICAL DATA: Severe nonhealing ulcerations along the right
posterior calf. Ulceration along the left posterior calf. Vascular
disorder.

EXAM:
MRI OF LOWER RIGHT TIBIA/FIBULA WITHOUT AND WITH CONTRAST
MRI OF THE LEFT TIBIA/FIBULA WITH AND WITHOUT CONTRAST
TECHNIQUE: Multiplanar, multisequence MR imaging of the right calf was
performed both before and after administration of intravenous
contrast.
Multiplanar, multisequence MR imaging of the left calf was performed
both before and after administration of intravenous contrast.
CONTRAST:  8 cc MultiHance

[Series 3: T1 · sagittal · 3.0mm · 0.78mm/px · 10 of 18 slices shown]
[im 2/18]
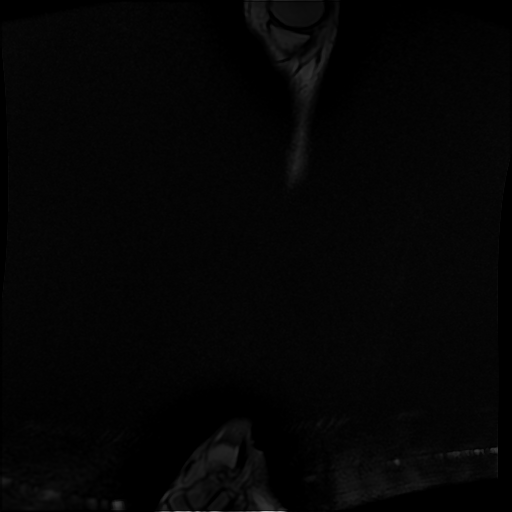
[im 4/18]
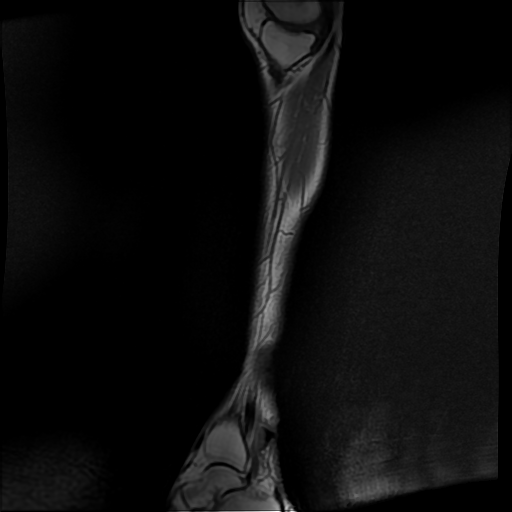
[im 6/18]
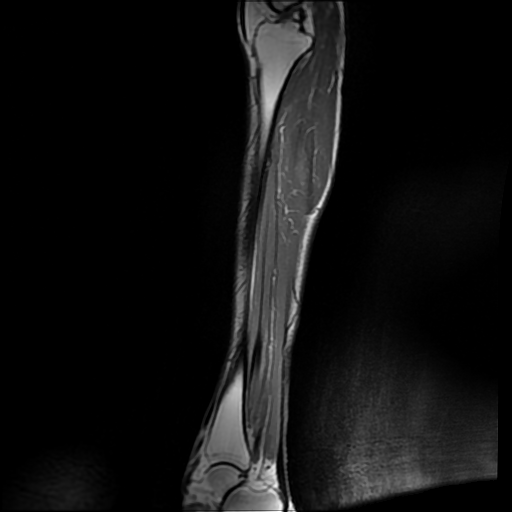
[im 8/18]
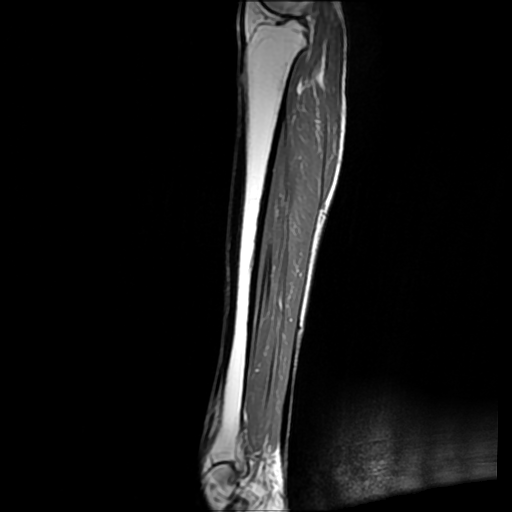
[im 10/18]
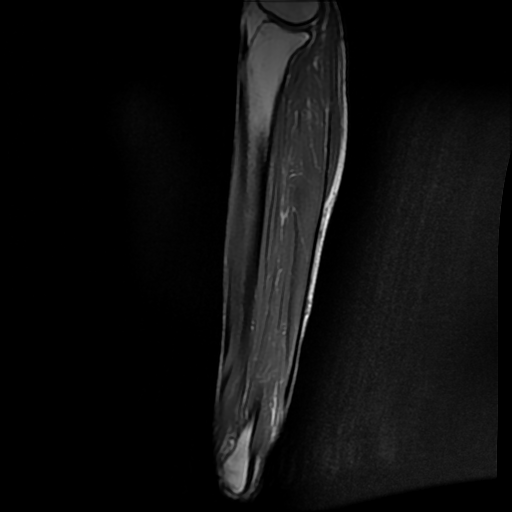
[im 11/18]
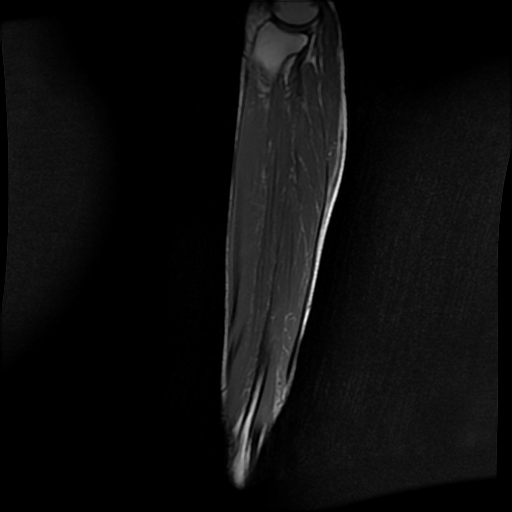
[im 13/18]
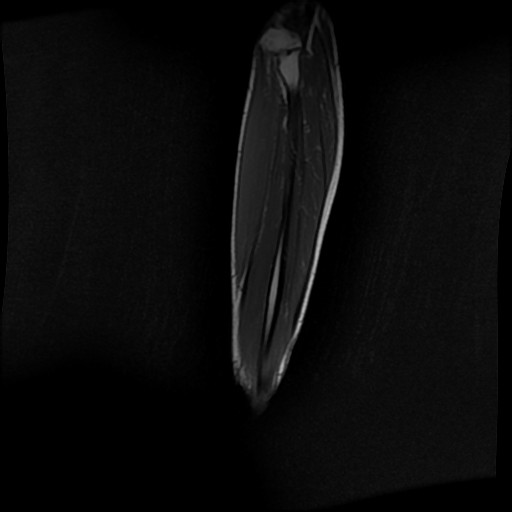
[im 15/18]
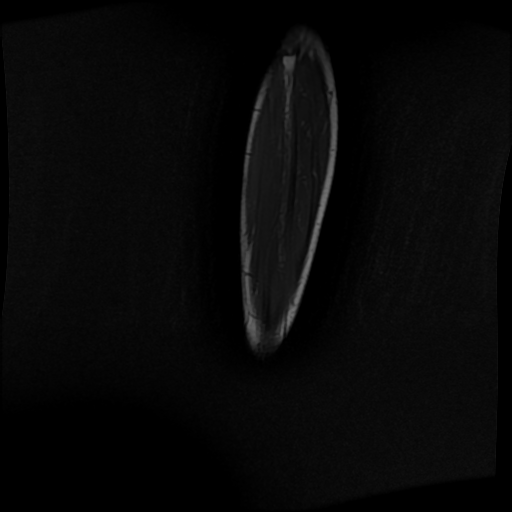
[im 16/18]
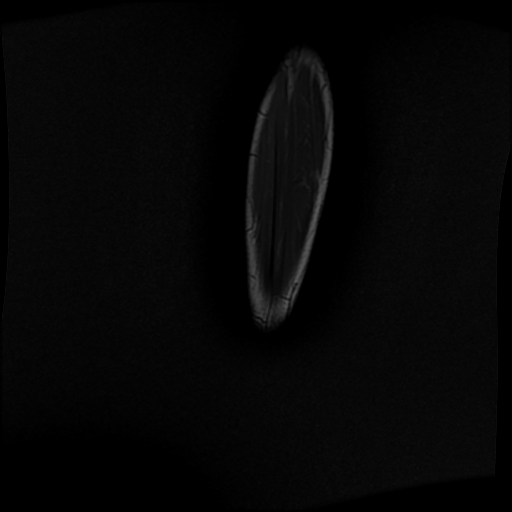
[im 17/18]
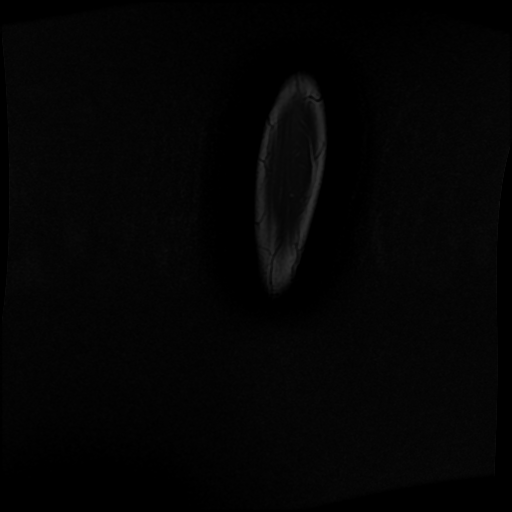

[Series 5: STIR · sagittal · 3.0mm · 0.78mm/px · 8 of 18 slices shown]
[im 2/18]
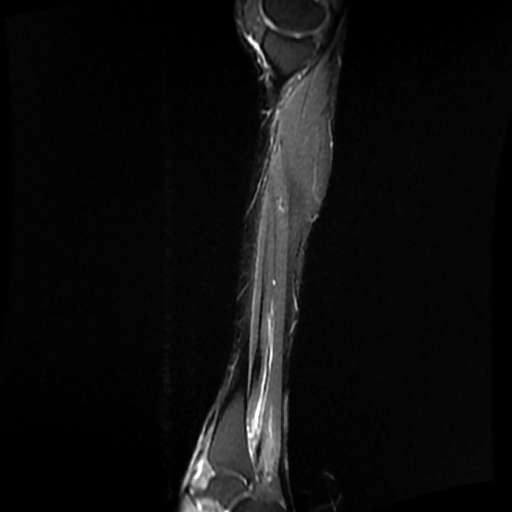
[im 4/18]
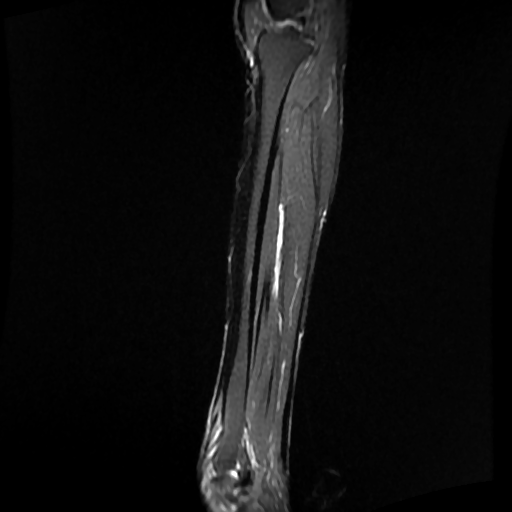
[im 6/18]
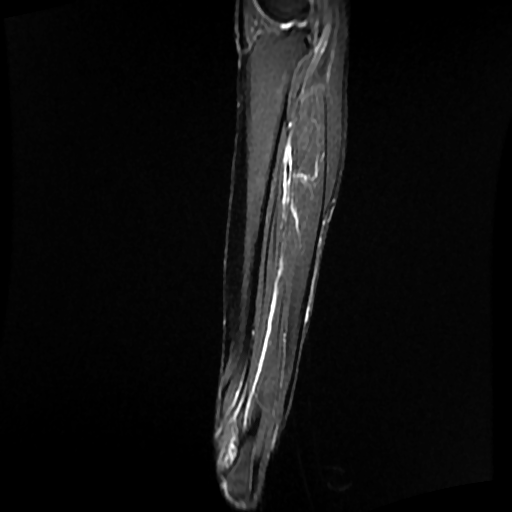
[im 8/18]
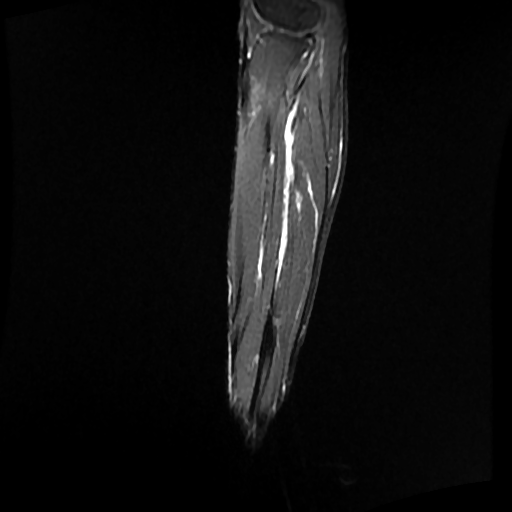
[im 10/18]
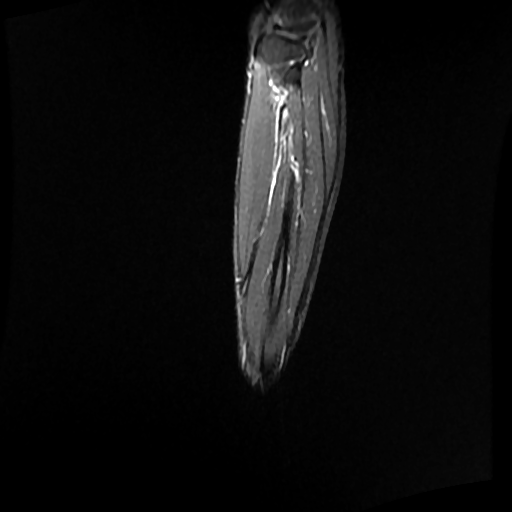
[im 11/18]
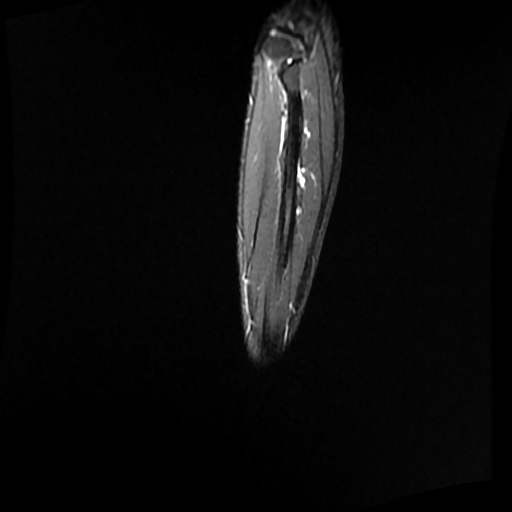
[im 13/18]
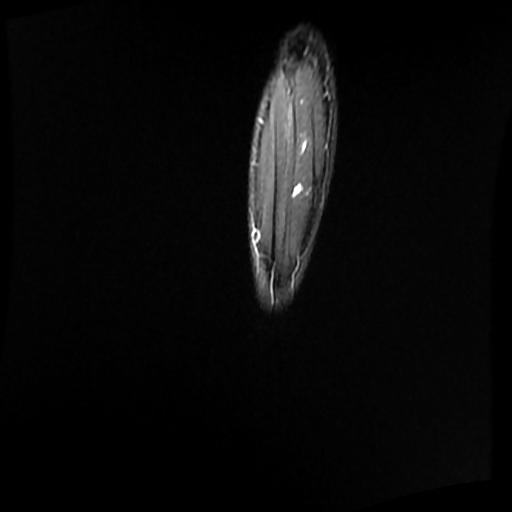
[im 16/18]
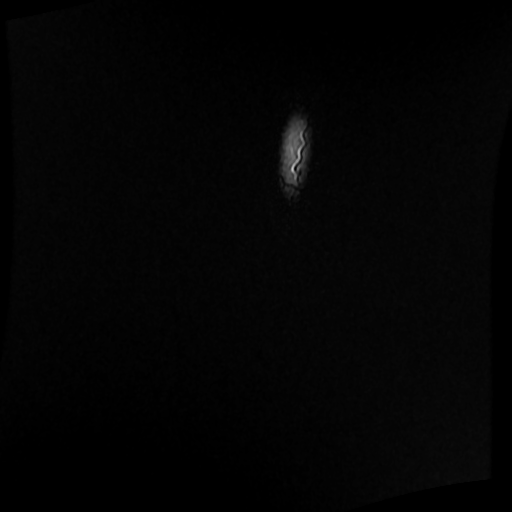

[18 of 36 positions shown; findings below may reference images not displayed]

FINDINGS: Right calf:

Markedly severe ulceration and soft tissue defect in the distal
third of the right calf extending down to the superficial fascia all
muscle layers of the soleus medially, and with irregular ulceration
posterolaterally as well. There is abnormal edema signal within the
soleus muscle am within the flexor hallucis longus muscle indicative
of myositis and fasciitis. I do not see a drainable abscess tracking
deep to the ulceration. There is considerable cellulitis along the
lower margin of the ulceration, extending superficial to the medial
malleolus. The region of ulceration extends down to the calcaneus
and tracks around the Achilles tendon, total length 15.7 cm. There
is tendinopathy along the distal gastrocnemius tendons and soleus
tendon.

I do not observe underlying osteomyelitis of the right tibia/fibula.

Left calf:

The findings on the left are less severe than on the right, with a
approximately 3 cm ulceration posteromedially in the subcutaneous
tissues, along the posteromedial margin of the medial flexor
tendons, questionably exposing the medial margin of the distal
soleus muscle. This does not appear to erode into the tarsal tunnel
and there is relatively mild surrounding cellulitis. No significant
associated myositis. No drainable abscess. No findings of left
tibial or fibular osteomyelitis.
IMPRESSION: 1. Markedly severe cutaneous and subcutaneous ulceration of the
right distal calf over a 15.7 cm excursion, extending around the
soleus muscle and Achilles tendon, with lack of overlying
subcutaneous and cutaneous tissues along portions of the muscle
beds. There is abnormal myositis involving the soleus and flexor
hallucis longus muscles with some underlying fasciitis, but no
drainable abscess or osteomyelitis. Surrounding cellulitis noted.
2. 3 cm ulceration posteromedially in the distal subcutaneous
tissues of the left calf. Very minimal surrounding cellulitis
without drainable abscess or osteomyelitis of the left tibia/fibula.

## 2018-07-09 NOTE — ED Notes (Signed)
Bed: JN35 Expected date:  Expected time:  Means of arrival:  Comments: Bilat amputee SI

## 2018-07-09 NOTE — ED Triage Notes (Signed)
Patient arrived with EMS c/o Suicidal Ideation. 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 Per pt she was been depressed since she had bilatetal amputation 7 months ago. Per patient was been homeless for 2 days.Patient stated she was kicked out by her brother 2 days ago because they're tired taking care of her.

## 2018-07-10 LAB — COMPREHENSIVE METABOLIC PANEL
ALBUMIN: 4.2 g/dL (ref 3.5–5.0)
ALT: 41 U/L (ref 0–44)
ANION GAP: 18 — AB (ref 5–15)
AST: 54 U/L — AB (ref 15–41)
Alkaline Phosphatase: 102 U/L (ref 38–126)
BILIRUBIN TOTAL: 2.9 mg/dL — AB (ref 0.3–1.2)
BUN: 23 mg/dL — AB (ref 6–20)
CO2: 18 mmol/L — AB (ref 22–32)
Calcium: 9.7 mg/dL (ref 8.9–10.3)
Chloride: 103 mmol/L (ref 98–111)
Creatinine, Ser: 0.82 mg/dL (ref 0.44–1.00)
GFR calc Af Amer: >60 mL/min (ref >60)
GFR calc non Af Amer: >60 mL/min (ref >60)
GLUCOSE: 67 mg/dL — AB (ref 70–99)
POTASSIUM: 4.4 mmol/L (ref 3.5–5.1)
SODIUM: 139 mmol/L (ref 135–145)
TOTAL PROTEIN: 7.5 g/dL (ref 6.5–8.1)

## 2018-07-10 LAB — RAPID URINE DRUG SCREEN, HOSP PERFORMED
AMPHETAMINES: NOT DETECTED
BARBITURATES: NOT DETECTED
BENZODIAZEPINES: NOT DETECTED
COCAINE: POSITIVE — AB
Opiates: NOT DETECTED
TETRAHYDROCANNABINOL: NOT DETECTED

## 2018-07-10 LAB — ETHANOL: Alcohol, Ethyl (B): <10 mg/dL (ref <10)

## 2018-07-10 LAB — CBC
HEMATOCRIT: 40.6 % (ref 36.0–46.0)
HEMOGLOBIN: 12.8 g/dL (ref 12.0–15.0)
MCH: 26.9 pg (ref 26.0–34.0)
MCHC: 31.5 g/dL (ref 30.0–36.0)
MCV: 85.3 fL (ref 78.0–100.0)
Platelets: 260 10*3/uL (ref 150–400)
RBC: 4.76 MIL/uL (ref 3.87–5.11)
RDW: 18.9 % — AB (ref 11.5–15.5)
WBC: 6.8 10*3/uL (ref 4.0–10.5)

## 2018-07-10 LAB — CBG MONITORING, ED: Glucose-Capillary: 99 mg/dL (ref 70–99)

## 2018-07-10 LAB — ACETAMINOPHEN LEVEL

## 2018-07-10 LAB — SALICYLATE LEVEL: Salicylate Lvl: <7 mg/dL (ref 2.8–30.0)

## 2018-07-10 LAB — HCG, QUANTITATIVE, PREGNANCY: HCG, BETA CHAIN, QUANT, S: 6 m[IU]/mL — AB (ref <5)

## 2018-07-10 MED ORDER — LORAZEPAM 1 MG PO TABS
1.0000 mg | ORAL_TABLET | Freq: Once | ORAL | Status: AC
  Administered 2018-07-10: 1 mg via ORAL
  Filled 2018-07-10: qty 1

## 2018-07-10 MED ORDER — GABAPENTIN 100 MG PO CAPS
100.0000 mg | ORAL_CAPSULE | Freq: Two times a day (BID) | ORAL | Status: DC
  Administered 2018-07-10 – 2018-07-13 (×7): 100 mg via ORAL
  Filled 2018-07-10 (×7): qty 1

## 2018-07-10 MED ORDER — PREDNISONE 20 MG PO TABS
60.0000 mg | ORAL_TABLET | Freq: Once | ORAL | Status: AC
  Administered 2018-07-10: 60 mg via ORAL
  Filled 2018-07-10: qty 3

## 2018-07-10 MED ORDER — OXYCODONE HCL 5 MG PO TABS
5.0000 mg | ORAL_TABLET | Freq: Once | ORAL | Status: AC
  Administered 2018-07-10: 5 mg via ORAL
  Filled 2018-07-10: qty 1

## 2018-07-10 NOTE — ED Notes (Addendum)
Pt c/o arm pain.  Pt has lesion to RUE that is purple with redness to the outer perimeter; c/o pain.

## 2018-07-10 NOTE — ED Provider Notes (Signed)
11:30 PM  Assumed care from Dr. Sedonia Small.  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  Evaluated by TTS who recommended outpatient treatment.  Social work also had seen the patient to help with disposal planning but patient refused their services.  Patient was found to have rash to the right upper arm concerning for vasculitis.  No sign of superimposed infection.  Plan is to repeat labs, reassess rash in the morning.   1:38 AM  Pt's blood pressure is 176/128.  She is asymptomatic.  I have reordered her amlodipine, losartan and furosemide.  No chest pain or shortness of breath.  Lungs are currently clear.  She complains of pain all over which she states is chronic and increasing pain in her right upper extremity.  She did have one lesion to the right wrist and just proximal to the right antecubital fossa.  She has now 2 more spots on the right arm and one to the left stump that are consistent with vasculitis from levamisole from cocaine use.  UDS + for cocaine today.  Given rapid progression, I feel she should be admitted for IV steroids for worsening vasculitis.  There is nothing at this time that appears to need debridement.  Her extremity is warm and well perfused with a 2+ right radial pulse.  No signs of superimposed infection.  1:58 AM Discussed patient's case with hospitalist, Dr. Hal Hope.  I have recommended admission and patient (and family if present) agree with this plan. Admitting physician will place admission orders.   I reviewed all nursing notes, vitals, pertinent previous records, EKGs, lab and urine results, imaging (as available).                      Lucette Kratz, Delice Bison, DO 07/11/18 [phone]

## 2018-07-10 NOTE — Progress Notes (Signed)
CSW consulted for homeless and shelter resources for patient. Patient is medically and psychiatrically cleared for discharge.  Patient has lived with her brother for 5+ years. On Friday, the patient reportedly had an argument with her brother and sister in law and left their home. Patient slept on the streets Friday night (would not specify what street or area of town).   Patient states she will not go back to her brother's home under any circumstances. When asked about discharge planning patient states "I don't have a plan, I'll just sleep on the streets. I'd rather sleep on the streets than go back to his (brother) house."  CSW offered to review shelter resource lists or complete VI-SPDAT. Patient declined, citing her medical issues as a barrier to shelter admission. Patient reiterated "I'll sleep on the streets."  CSW brought up placement to patient; patient has [place] Medicaid and receives $775 monthly in social security. Patient declined all placement options as she is not willing to give up any of her check. CSW explained that placement would cover room, board, and food, etc would be covered. Patient "I'm not about to give them my check, no."  CSW granted verbal permission to contact brother, Ailene Ards [phone]  [name] expressed frustration with patient's resistance to "accept help." [name] shared that he has been trying to talk to the patient to transition into nursing care or assisted living and the patient has refused.  CSW staffed case with SW AD. Patient has capacity, is her own guardian, and has declined all social work services including placement for SNF.   CSW signing off as social work resources were declined. Please reconsult if additional needs arise.  Stephanie Acre, [place] Social Worker [phone]

## 2018-07-10 NOTE — ED Notes (Signed)
Writer spoke with Gardiner Rhyme RN AD after Narda Rutherford RN and writer reviewed pts chart in regards to discharge orders. Pt is adamant that she wants to be discharged back to the street. She has been evaluated multiple times by CSW, PA, [facility] Support. She has been offered placement in facility, she refused due to not wanting to give up her check. CSW spoke with brother who sounds as if he has exhausted all resources in assisting pt. Pt has a wheel chair as a mode of transportation. She states she stayed 2 nights on the street and that is her plan, return to street. She remains her own guardian therefore continues to make her own decisions.

## 2018-07-10 NOTE — ED Notes (Signed)
Pt cleaned of urinary incontinence, new brief provided; linens changed.

## 2018-07-10 NOTE — ED Provider Notes (Addendum)
Called to evaluate this 57 year old female with history of levamisole-induced vasculitis.  Concerning rash to the right upper extremity, exquisitely tender to palpation.  Some concern for similar vasculitis based on her past medical history.  The area was marked with a skin pen, patient given oxycodone for pain control, will monitor closely to determine need for further testing and/or medical admission.  11:18 PM upon reexamination of the rash, there has been no progression or spread.  Patient is sleeping comfortably.  Still very tender to palpation.  According to chart review, patient has both medically and psychiatrically cleared for discharge.  However she has nowhere and is a double amputee.  Given the new rash and concern for levamisole induced vasculitis, I would like to keep her overnight and have the rash reevaluated in the morning to ensure no progression.  The treatment for this vasculitis is mostly supportive, the general surgery consultation for debridement can be considered.   Barth Kirks. Sedonia Small, [place] [email or web address]    Maudie Flakes, MD 07/10/18 [phone]

## 2018-07-10 NOTE — ED Notes (Addendum)
Pt has agreed to placment, CSW contacted and is aware.

## 2018-07-10 NOTE — Progress Notes (Signed)
CSW aware of plan to discharge patient today as she is medically and psychologically cleared.  Patient has a social work consult for homeless or housing resources. Per notes, patient was living with her brother and sister in law until two days ago, when she left the home following an argument.   CSW attempted to speak with patient at bedside, but she appeared to be very tired and could not stay awake to hold a conversation or review resources. CSW aware Peer Support Consult is pending for patient as well.  CSW left printed homelessness and housing resources for patient, will attempt to follow up with patient again prior to discharge to review resources with patient.  Stephanie Acre, [place] Social Worker [phone]

## 2018-07-10 NOTE — Clinical Social Work Note (Signed)
Clinical Social Work Assessment  Patient Details  Name: [name] MRN: [id number] Date of Birth: 12/30/1960  Date of referral:  07/10/18               Reason for consult:  Discharge Planning, Housing Concerns/Homelessness                Permission sought to share information with:  Family Supports Permission granted to share information::  Yes, Verbal Permission Granted  Name::     Brother, [name]  Agency::     Relationship::     Contact Information:    [name] [phone]  Housing/Transportation Living arrangements for the past 2 months:  Single Family Home Source of Information:  Patient, Siblings Patient Interpreter Needed:  None Criminal Activity/Legal Involvement Pertinent to Current Situation/Hospitalization:  No - Comment as needed Significant Relationships:  None Lives with:  Siblings, Self(Homeless since Friday) Do you feel safe going back to the place where you live?  No Need for family participation in patient care:  No (Coment)  Care giving concerns:  CSW consulted for homeless and shelter resources for patient. Patient is medically and psychiatrically cleared for discharge.  "57-year-old female with history of cocaine abuse with chronic vasculitis, migraine headaches, anemia, rheumatoid arthritis, hypertension, congestive heart failure. Reports 2 days ago she had an accident on the floor she was not able to get to the bathroom in time. States her brother and his wife got extremely mad at her and kept her out of the house stating they were "tired of taking care of her".  States she has been living on the street for the past 2 days."  Patient is a double BTK amputee.  Social Worker assessment / plan:    CSW tried to meet with patient earlier in the day to complete assessment and offer resources, patient appeared to be tired at this time and was not able to entertain a conversation. CSW left printed shelter resources at this time with a plan to follow up.  CSW  met with patient a second time, introduced self and role.  Patient states she has lived with her brother "for a while," when asked further, she said greater than one year (brother says 5 years). On Friday, the patient reportedly had an argument with her brother and sister in law and left their home. Patient slept on the streets (would not specify what street or area of town).   Patient states she will not go back to her brother's home under any circumstances. When asked about discharge planning patient states "I don't have a plan, I'll just sleep on the streets. I'd rather sleep on the streets than go back to his (brother) house."  CSW offered to review shelter resource lists or complete VI-SPDAT. Patient declined, citing her medical issues as a barrier to shelter admission. Patient reiterated "I'll sleep on the streets."  CSW brought up placement to patient; patient has [place] Medicaid and receives $775 monthly in social security. Patient declined all placement options as she is not willing to give up any of her check. CSW explained that placement would cover room, board, and food, etc would be covered. Patient "I'm not about to give them my check, no."  CSW granted verbal permission to contact brother, [name].  [name] expressed frustration with patient's resistance to "accept help." [name] shared that he has been trying to talk to the patient to transition into nursing care or assisted living and the patient has refused.  Patient has   declined all social work services.  Employment status:  Disabled (Comment on whether or not currently receiving Disability) Insurance information:  Medicaid In [place] PT Recommendations:  Not assessed at this time Information / Referral to community resources:  Shelter  Patient/Family's Response to care:   Patient has declined all social work services (resources, pursing SNF placement, etc).  Patient/Family's Understanding of and Emotional Response to Diagnosis,  Current Treatment, and Prognosis:   Patient aware that she is medically and psychiatrically cleared for discharged; does not wish to pursue social work resources or placement at this time.  Emotional Assessment Appearance:  Appears older than stated age Attitude/Demeanor/Rapport:    Affect (typically observed):  Defensive, Guarded Orientation:  Oriented to Self, Oriented to Place, Oriented to  Time, Oriented to Situation(Oriented but provided some answers that conflicted what family said.) Alcohol / Substance use:  Illicit Drugs(Cocaine) Psych involvement (Current and /or in the community):  Yes (Comment)  Discharge Needs  Concerns to be addressed:  Homelessness, Home Safety Concerns, Patient refuses services Readmission within the last 30 days:  Yes Current discharge risk:  Homeless, Dependent with Mobility, Substance Abuse, Physical Impairment, Lives alone Barriers to Discharge:  Homeless with medical needs   Joellen Jersey, [place] 07/10/2018, 4:01 PM

## 2018-07-10 NOTE — Patient Outreach (Signed)
ED Peer Support Specialist Patient Intake (Complete at intake & 30-60 Day Follow-up)  Name: [name]  MRN: [id number]  Age: 57 y.o.   Date of Admission: 07/10/2018  Intake: Initial Comments:      Primary Reason Admitted: female who presents to the ED voluntarily due to depression and SI without a plan. Pt reports she has been homeless for the past 2 days due to her brother putting her out. Pt is a double amputee and she reports her family "got tired of helping her" and she now has nowhere to go. Pt states she misses her mother who she has not seen in more than 25 years. During the assessment, the pt began to cry hysterically saying "I want my mommy. Mommy please come get me." Pt states her mother disappeared and she has no idea where she went. Pt denies that she has ever attempted suicide in the past but has been admitted to [facility] and other inpt facilities in the past due to depression and substance abuse.     Lab values: Alcohol/ETOH: Negative Positive UDS? Yes Amphetamines: No Barbiturates: No Benzodiazepines: No Cocaine: No Opiates: No Cannabinoids: No  Demographic information: Gender: Female Ethnicity: African American Marital Status: Single Insurance Status: Medicaid Ecologist (Work Neurosurgeon, Physicist, medical, etc.: No Lives with: Sibling Living situation: House/Apartment  Reported Patient History: Patient reported health conditions: Hypertension Patient aware of HIV and hepatitis status: No  In past year, has patient visited ED for any reason? No  Number of ED visits:    Reason(s) for visit:    In past year, has patient been hospitalized for any reason?    Number of hospitalizations:    Reason(s) for hospitalization:    In past year, has patient been arrested? No  Number of arrests:    Reason(s) for arrest:    In past year, has patient been incarcerated? No  Number of incarcerations:    Reason(s) for incarceration:    In  past year, has patient received medication-assisted treatment? No  In past year, patient received the following treatments:    In past year, has patient received any harm reduction services? No  Did this include any of the following?    In past year, has patient received care from a mental health provider for diagnosis other than SUD? No  In past year, is this first time patient has overdosed? No  Number of past overdoses:    In past year, is this first time patient has been hospitalized for an overdose? No  Number of hospitalizations for overdose(s):    Is patient currently receiving treatment for a mental health diagnosis? No  Patient reports experiencing difficulty participating in SUD treatment: No    Most important reason(s) for this difficulty?    Has patient received prior services for treatment? No  In past, patient has received services from following agencies:    Plan of Care:  Suggested follow up at these agencies/treatment centers: [facility])(Wants to stop using but dont know if she can)  Other information: CPSS met with Pt and was tried to gain information to better assist Pt. CPSS prompted Pt to wake up an try to answer series of question. CPSS was able to get through have of the question and Pt was back a sleep. CPSS asked several times for Pt to wake up and try to speak with CPSS.CPSS tried to speak with  Pt to better assist with   London Sheer,  CPSS  07/10/2018 2:13 PM

## 2018-07-10 NOTE — ED Notes (Addendum)
Pt sleeping, easily aroused.  Pt unable to answer orientation questions, but reports that she came to the ED [facility] [address].  Pt declines to go back to her brothers, or the shelter, pt reports that she does not have anyone else to stay with.

## 2018-07-10 NOTE — ED Notes (Signed)
Pt sleeping, easily aroused and fell back to sleep immediately after VS were taken.  Pt reports that she has a hx of htn, but os  Not on any medications.

## 2018-07-10 NOTE — ED Notes (Signed)
Dr Tiburcio Pea and Theodoro Clock DNP into see.  Pt reports that she's not "feeling to good." Had been living with her brother, but left because he was mad at her.  Pt reports continuing suicidal thoughts, also reports that she is interested in rehab for her cocaine use.  Pt with minimal verbal responses to questions.

## 2018-07-10 NOTE — ED Notes (Signed)
Pt is aware that she is going to be dc'd and stated that she was going to go "back where I came from..  street."

## 2018-07-10 NOTE — ED Notes (Signed)
Pt incont of urine, cleaned and changed by NA

## 2018-07-10 NOTE — ED Notes (Signed)
CSW into see

## 2018-07-10 NOTE — ED Notes (Signed)
Raynald Blend into see

## 2018-07-10 NOTE — ED Notes (Signed)
Dr. Sedonia Small informed of pt's c/o pain to RUE d/t lesion.

## 2018-07-10 NOTE — BH Assessment (Addendum)
Assessment Note  [name] is an 57 y.o. female who presents to the ED voluntarily due to depression and SI without a plan. Pt reports she has been homeless for the past 2 days due to her brother putting her out. Pt is a double amputee and she reports her family "got tired of helping her" and she now has nowhere to go. Pt states she misses her mother who she has not seen in more than 25 years. During the assessment, the pt began to cry hysterically saying "I want my mommy. Mommy please come get me." Pt states her mother disappeared and she has no idea where she went. Pt denies that she has ever attempted suicide in the past but has been admitted to [facility] and other inpt facilities in the past due to depression and substance abuse.   Pt admits she uses cocaine "often." Pt was asked if she experiences AVH and she states "sometimes." When pt was asked to describe her AVH she stated "I just see different things." Pt began to cry again saying "I want my mommy, please, I want my mommy."    Per Patriciaann Clan, PA pt is recommended for continued observation for safety and stabilization and to be reassessed in the AM by psych. EDP Larene Pickett, PA-C and pt's nurse Junior, RN have been advised of the disposition. Pt will likely need SW consult due to homelessness and inability to care for self as pt is a double amputee.   Diagnosis: Unspecified Depressive disorder; Cocaine use disorder, severe  Past Medical History:  Past Medical History:  Diagnosis Date  . CAP (community acquired pneumonia) 03/2014 X 2  . Cocaine abuse ([place])    ongoing with resultant vaculitis.  . Depression   . Headache    "weekly" (07/29/2016)  . Hypertension   . Inflammatory arthritis   . Migraines    "probably 5-6/yr" (07/29/2016)  . Normocytic anemia    BL Hgb 9.8-12. Last anemia panel 04/2010 - showing Fe 19, ferritin 101.  Pt on monthly B12 injections  . Rheumatoid arthritis(714.0)    patient reported  . VASCULITIS  04/17/2010   2/2 levimasole toxicity vs autoimmune d/o   ;  2/2 Levimasole toxicity. Followed by Dr. Louanne Skye    Past Surgical History:  Procedure Laterality Date  . AMPUTATION Left 05/22/2016   Procedure: AMPUTATION LEFT LONG FINGER;  Surgeon: Marybelle Killings, MD;  Location: [place];  Service: Orthopedics;  Laterality: Left;  . AMPUTATION Bilateral 04/10/2017   Procedure: AMPUTATION BELOW KNEE;  Surgeon: Newt Minion, MD;  Location: [place];  Service: Orthopedics;  Laterality: Bilateral;  . AMPUTATION Bilateral 02/06/2018   Procedure: AMPUTATION ABOVE KNEE;  Surgeon: Newt Minion, MD;  Location: [place];  Service: Orthopedics;  Laterality: Bilateral;  . HERNIA REPAIR     "stomach"  . I&D EXTREMITY Right 09/26/2015   Procedure: IRRIGATION AND DEBRIDEMENT LEG WOUND  VAC PLACEMENT.;  Surgeon: Loel Lofty Dillingham, DO;  Location: [place];  Service: Plastics;  Laterality: Right;  . INCISION AND DRAINAGE OF WOUND Bilateral 10/20/2016   Procedure: IRRIGATION AND DEBRIDEMENT WOUND BILATERAL;  Surgeon: Edrick Kins, DPM;  Location: [place];  Service: Podiatry;  Laterality: Bilateral;  . IRRIGATION AND DEBRIDEMENT ABSCESS Bilateral 09/26/2013   Procedure: DEBRIDEMENT ULCERS BILATERAL THIGHS;  Surgeon: Gwenyth Ober, MD;  Location: [place];  Service: General;  Laterality: Bilateral;  . SKIN BIOPSY Bilateral    shin nodules    Family History:  Family History  Problem Relation Age of Onset  . Breast cancer Mother        Breast cancer  . Alcohol abuse Mother   . Colon cancer Maternal Aunt 14  . Alcohol abuse Father     Social History:  reports that she has been smoking cigarettes. She has a 4.56 pack-year smoking history. She has never used smokeless tobacco. She reports that she has current or past drug history. Drugs: "Crack" cocaine and Cocaine. She reports that she does not drink alcohol.  Additional Social History:  Alcohol / Drug Use Pain Medications: See MAR Prescriptions: See MAR Over the Counter: See  MAR History of alcohol / drug use?: Yes Longest period of sobriety (when/how long): none reported Negative Consequences of Use: Personal relationships Withdrawal Symptoms: Patient aware of relationship between substance abuse and physical/medical complications Substance #1 Name of Substance 1: Cocaine 1 - Age of First Use: teens 1 - Amount (size/oz): excessive 1 - Frequency: daily 1 - Duration: ongoing 1 - Last Use / Amount: 07/09/18  CIWA: CIWA-Ar BP: (!) 176/111 Pulse Rate: 94 COWS:    Allergies:  Allergies  Allergen Reactions  . Acetaminophen Swelling and Other (See Comments)    Reaction:  Eyelid swelling  . Lisinopril     UNSPECIFIED REACTION     Home Medications:  (Not in a hospital admission)  OB/GYN Status:  No LMP recorded. Patient is postmenopausal.  General Assessment Data Location of Assessment: [facility] ED TTS Assessment: In system Is this a Tele or Face-to-Face Assessment?: Face-to-Face Is this an Initial Assessment or a Re-assessment for this encounter?: Initial Assessment Patient Accompanied by:: (alone) Language Other than English: No Living Arrangements: Homeless/Shelter What gender do you identify as?: Female Marital status: Single Pregnancy Status: No Living Arrangements: Alone Can pt return to current living arrangement?: Yes Admission Status: Voluntary Is patient capable of signing voluntary admission?: Yes Referral Source: Self/Family/Friend Insurance type: Medicaid     Crisis Care Plan Living Arrangements: Alone Name of Psychiatrist: none Name of Therapist: none  Education Status Is patient currently in school?: No Is the patient employed, unemployed or receiving disability?: Receiving disability income  Risk to self with the past 6 months Suicidal Ideation: Yes-Currently Present Has patient been a risk to self within the past 6 months prior to admission? : No Suicidal Intent: No Has patient had any suicidal intent within the past 6  months prior to admission? : No Is patient at risk for suicide?: Yes Suicidal Plan?: No Has patient had any suicidal plan within the past 6 months prior to admission? : No Access to Means: No What has been your use of drugs/alcohol within the last 12 months?: cocaine Previous Attempts/Gestures: No Other Self Harm Risks: ongoing SI, depression, lack of resources Triggers for Past Attempts: None known Intentional Self Injurious Behavior: None Family Suicide History: No Recent stressful life event(s): Financial Problems, Loss (Comment), Conflict (Comment), Recent negative physical changes, Turmoil (Comment), Trauma (Comment)(conflict with brother, homeless) Persecutory voices/beliefs?: No Depression: Yes Depression Symptoms: Despondent, Insomnia, Isolating, Tearfulness, Fatigue, Guilt, Feeling angry/irritable, Feeling worthless/self pity, Loss of interest in usual pleasures Substance abuse history and/or treatment for substance abuse?: Yes Suicide prevention information given to non-admitted patients: Not applicable  Risk to Others within the past 6 months Homicidal Ideation: No Does patient have any lifetime risk of violence toward others beyond the six months prior to admission? : No Thoughts of Harm to Others: No Current Homicidal Intent: No Current Homicidal Plan: No Access to  Homicidal Means: No History of harm to others?: No Assessment of Violence: None Noted Does patient have access to weapons?: No Criminal Charges Pending?: No Does patient have a court date: No Is patient on probation?: No  Psychosis Hallucinations: Auditory, Visual Delusions: None noted  Mental Status Report Appearance/Hygiene: Disheveled Eye Contact: Poor Motor Activity: Unsteady Speech: Slurred, Incoherent Level of Consciousness: Crying, Drowsy Mood: Depressed, Helpless, Sad, Sullen, Despair Affect: Depressed, Sad, Sullen, Frightened Anxiety Level: None Thought Processes: Relevant,  Coherent Judgement: Impaired Orientation: Person, Place, Situation, Appropriate for developmental age, Time Obsessive Compulsive Thoughts/Behaviors: None  Cognitive Functioning Concentration: Normal Memory: Recent Intact, Remote Intact Is patient IDD: No Insight: Poor Impulse Control: Poor Appetite: Fair Have you had any weight changes? : No Change Sleep: Decreased Total Hours of Sleep: 5 Vegetative Symptoms: None  ADLScreening [facility] Assessment Services) Patient's cognitive ability adequate to safely complete daily activities?: Yes Patient able to express need for assistance with ADLs?: Yes Independently performs ADLs?: No  Prior Inpatient Therapy Prior Inpatient Therapy: Yes Prior Therapy Dates: 2016, 2015, 2014 Prior Therapy Facilty/Provider(s): [facility] Reason for Treatment: MDD, SA  Prior Outpatient Therapy Prior Outpatient Therapy: No Does patient have an ACCT team?: No Does patient have Intensive In-House Services?  : No Does patient have [facility] services? : No Does patient have P4CC services?: No  ADL Screening (condition at time of admission) Patient's cognitive ability adequate to safely complete daily activities?: Yes Is the patient deaf or have difficulty hearing?: No Does the patient have difficulty seeing, even when wearing glasses/contacts?: No Does the patient have difficulty concentrating, remembering, or making decisions?: Yes Patient able to express need for assistance with ADLs?: Yes Does the patient have difficulty dressing or bathing?: Yes Independently performs ADLs?: No Communication: Independent Dressing (OT): Needs assistance Is this a change from baseline?: Pre-admission baseline Grooming: Independent Feeding: Independent Bathing: Needs assistance Is this a change from baseline?: Pre-admission baseline Toileting: Needs assistance Is this a change from baseline?: Pre-admission baseline In/Out Bed: Needs assistance Is this a change from  baseline?: Pre-admission baseline Walks in Home: Needs assistance Is this a change from baseline?: Pre-admission baseline Does the patient have difficulty walking or climbing stairs?: Yes Weakness of Legs: Both Weakness of Arms/Hands: None  Home Assistive Devices/Equipment Home Assistive Devices/Equipment: Wheelchair, Shower chair with back    Abuse/Neglect Assessment (Assessment to be complete while patient is alone) Abuse/Neglect Assessment Can Be Completed: Yes Physical Abuse: Denies Verbal Abuse: Denies Sexual Abuse: Denies Exploitation of patient/patient's resources: Denies Self-Neglect: Denies     Regulatory affairs officer (For Healthcare) Does Patient Have a Medical Advance Directive?: No Would patient like information on creating a medical advance directive?: No - Patient declined          Disposition: Per Patriciaann Clan, PA pt is recommended for continued observation for safety and stabilization and to be reassessed in the AM by psych. EDP Larene Pickett, PA-C and pt's nurse Junior, RN have been advised of the disposition. Pt will likely need SW consult due to homelessness and inability to care for self as pt is a double amputee.   Disposition Initial Assessment Completed for this Encounter: Yes Disposition of Patient: (overnight OBS pending AM psych assessment ) Patient refused recommended treatment: No  On Site Evaluation by:   Reviewed with Physician:    Lyanne Co 07/10/2018 2:37 AM

## 2018-07-10 NOTE — ED Provider Notes (Signed)
[facility] DEPT Provider Note   CSN: [id number] Arrival date & time: 07/09/18  2347     History   Chief Complaint Chief Complaint  Patient presents with  . Suicidal    HPI [name] is a 57 y.o. female.  The history is provided by the patient and medical records.     57 year old female with history of cocaine abuse with chronic vasculitis, migraine headaches, anemia, rheumatoid arthritis, hypertension, congestive heart failure, presenting to the ED with suicidal ideation.  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  States lately she has been living with her brother.  She reports 2 days ago she had an accident on the floor she was not able to get to the bathroom in time.  States her brother and his wife got extremely mad at her and kept her out of the house stating they were "tired of taking care of her".  States she has been living on the street for the past 2 days.  States over the past few weeks she has felt suicidal, worse over the past few days.  Does not voice specific plan.  States she feels "no way out".  States she has been missing her mother a lot, states she disappeared with her sister about 25 years ago and has not heard from them since.  Denies homicidal ideation.  No hallucinations.  States she used to take medications for anxiety/depression, none in the past several years.  Past Medical History:  Diagnosis Date  . CAP (community acquired pneumonia) 03/2014 X 2  . Cocaine abuse ([place])    ongoing with resultant vaculitis.  . Depression   . Headache    "weekly" (07/29/2016)  . Hypertension   . Inflammatory arthritis   . Migraines    "probably 5-6/yr" (07/29/2016)  . Normocytic anemia    BL Hgb 9.8-12. Last anemia panel 04/2010 - showing Fe 19, ferritin 101.  Pt on monthly B12 injections  . Rheumatoid arthritis(714.0)    patient reported    . 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 Followed by Dr. Louanne Skye    Patient Active Problem List   Diagnosis Date Noted  . Acute on chronic diastolic CHF (congestive heart failure) ([place]) 06/21/2018  . Malnutrition of moderate degree 05/20/2018  . Acute congestive heart failure ([place])   . Suicidal ideation 02/02/2018  . HCAP (healthcare-associated pneumonia) 02/02/2018  . Acute on chronic respiratory failure with hypoxia ([place]) 02/02/2018  . Sepsis ([place]) 02/02/2018  . Ulcer of amputation stump of lower extremity ([place]) 10/23/2017  . Cocaine-induced mood disorder with depressive symptoms ([place]) 08/10/2017  . Hypertensive crisis   . Phantom limb pain ([place])   . S/P bilateral below knee amputation ([place]) 04/13/2017  . Tobacco abuse   . Post-operative pain   . Acute blood loss anemia   . Atherosclerosis of native arteries of extremities with gangrene, bilateral legs ([place])   . Wound infection 03/30/2017  . AKI (acute kidney injury) ([place]) 02/07/2017  . Acute kidney injury ([place]) 02/06/2017  . Wound healing, delayed   . Chest pain 04/07/2015  . Protein-calorie malnutrition, severe ([place]) 01/15/2015  . MDD (major depressive disorder), recurrent episode, severe ([place]) 10/16/2014  . Cocaine use disorder, severe, dependence ([place]) 10/10/2014  . 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 Essential hypertension 02/26/2010    Past Surgical History:  Procedure Laterality  Date  . AMPUTATION Left 05/22/2016   Procedure: AMPUTATION LEFT LONG FINGER;  Surgeon: Marybelle Killings, MD;  Location: [place];  Service: Orthopedics;  Laterality: Left;  . AMPUTATION Bilateral 04/10/2017   Procedure: AMPUTATION BELOW KNEE;  Surgeon: Newt Minion, MD;  Location: [place];  Service: Orthopedics;  Laterality: Bilateral;  . AMPUTATION Bilateral 02/06/2018   Procedure: AMPUTATION ABOVE KNEE;  Surgeon: Newt Minion, MD;  Location: [place];  Service: Orthopedics;   Laterality: Bilateral;  . HERNIA REPAIR     "stomach"  . I&D EXTREMITY Right 09/26/2015   Procedure: IRRIGATION AND DEBRIDEMENT LEG WOUND  VAC PLACEMENT.;  Surgeon: Loel Lofty Dillingham, DO;  Location: [place];  Service: Plastics;  Laterality: Right;  . INCISION AND DRAINAGE OF WOUND Bilateral 10/20/2016   Procedure: IRRIGATION AND DEBRIDEMENT WOUND BILATERAL;  Surgeon: Edrick Kins, DPM;  Location: [place];  Service: Podiatry;  Laterality: Bilateral;  . IRRIGATION AND DEBRIDEMENT ABSCESS Bilateral 09/26/2013   Procedure: DEBRIDEMENT ULCERS BILATERAL THIGHS;  Surgeon: Gwenyth Ober, MD;  Location: [place];  Service: General;  Laterality: Bilateral;  . SKIN BIOPSY Bilateral    shin nodules     OB History   None      Home Medications    Prior to Admission medications   Medication Sig Start Date End Date Taking? Authorizing Provider  amLODipine (NORVASC) 10 MG tablet Take 1 tablet (10 mg total) by mouth daily. 06/23/18  Yes Patrecia Pour, Christean Grief, MD  furosemide (LASIX) 40 MG tablet Take 1 tablet (40 mg total) by mouth daily. 06/23/18  Yes Patrecia Pour, Christean Grief, MD  losartan (COZAAR) 25 MG tablet Take 1 tablet (25 mg total) by mouth daily. 06/23/18  Yes Doreatha Lew, MD    Family History Family History  Problem Relation Age of Onset  . Breast cancer Mother        Breast cancer  . Alcohol abuse Mother   . Colon cancer Maternal Aunt 72  . Alcohol abuse Father     Social History Social History   Tobacco Use  . Smoking status: Current Every Day Smoker    Packs/day: 0.12    Years: 38.00    Pack years: 4.56    Types: Cigarettes  . Smokeless tobacco: Never Used  . Tobacco comment: 2 A DAY  Substance Use Topics  . Alcohol use: No    Alcohol/week: 0.0 standard drinks  . Drug use: Yes    Types: "Crack" cocaine, Cocaine    Comment: Smoked crack 06/02/2017     Allergies   Acetaminophen and Lisinopril   Review of Systems Review of Systems  Psychiatric/Behavioral: Positive for  suicidal ideas.  All other systems reviewed and are negative.    Physical Exam Updated Vital Signs BP (!) 176/111 (BP Location: Left Arm)   Pulse 94   Temp (!) 97.4 F (36.3 C) (Oral)   Resp 16   SpO2 100%   Physical Exam  Constitutional: She is oriented to person, place, and time. She appears well-developed and well-nourished.  HENT:  Head: Normocephalic and atraumatic.  Mouth/Throat: Oropharynx is clear and moist.  Eyes: Pupils are equal, round, and reactive to light. Conjunctivae and EOM are normal.  Neck: Normal range of motion.  Cardiovascular: Normal rate, regular rhythm and normal heart sounds.  Pulmonary/Chest: Effort normal and breath sounds normal. No stridor. No respiratory distress.  Abdominal: Soft. Bowel sounds are normal. There is no tenderness. There is no rebound.  Musculoskeletal: Normal range of  motion.  Bilateral AKA's  Neurological: She is alert and oriented to person, place, and time.  Skin: Skin is warm and dry.  Psychiatric: She exhibits a depressed mood. She expresses suicidal ideation.  Sobbing during exam  Nursing note and vitals reviewed.    ED Treatments / Results  Labs (all labs ordered are listed, but only abnormal results are displayed) Labs Reviewed  COMPREHENSIVE METABOLIC PANEL - Abnormal; Notable for the following components:      Result Value   CO2 18 (*)    Glucose, Bld 67 (*)    BUN 23 (*)    AST 54 (*)    Total Bilirubin 2.9 (*)    Anion gap 18 (*)    All other components within normal limits  ACETAMINOPHEN LEVEL - Abnormal; Notable for the following components:   Acetaminophen (Tylenol), Serum <10 (*)    All other components within normal limits  CBC - Abnormal; Notable for the following components:   RDW 18.9 (*)    All other components within normal limits  RAPID URINE DRUG SCREEN, HOSP PERFORMED - Abnormal; Notable for the following components:   Cocaine POSITIVE (*)    All other components within normal limits  HCG,  QUANTITATIVE, PREGNANCY - Abnormal; Notable for the following components:   hCG, Beta Chain, Quant, S 6 (*)    All other components within normal limits  ETHANOL  SALICYLATE LEVEL  CBG MONITORING, ED    EKG None  Radiology No results found.  Procedures Procedures (including critical care time)  Medications Ordered in ED Medications  LORazepam (ATIVAN) tablet 1 mg (1 mg Oral Given 07/10/18 0133)     Initial Impression / Assessment and Plan / ED Course  I have reviewed the triage vital signs and the nursing notes.  Pertinent labs & imaging results that were available during my care of the patient were reviewed by me and considered in my medical decision making (see chart for details).  57 y.o. F here with SI.  Has been living with brother, however had accident in the floor and was kicked out 2 days ago.  States she wants to kill herself because there is " no way out".  She does not disclose specific plan.  No HI.  No AVH.  Also reports missing her mother whom she has not heard from in 67 years.  Not currently on meds for anxiety/depression.  Labs reassuring.  Medically clear.  TTS has evaluated.   Recommends overnight observation and reassessment in the morning.  If cleared from psychiatric standpoint at that time, patient may need SW evaluation for placement/resources given her bilateral AKAs and no current housing.  Final Clinical Impressions(s) / ED Diagnoses   Final diagnoses:  Suicidal ideation    ED Discharge Orders    None       Larene Pickett, PA-C 07/10/18 1610    Randal Buba, April, MD 07/10/18 [phone]

## 2018-07-10 NOTE — Progress Notes (Signed)
Per Patriciaann Clan, PA pt is recommended for continued observation for safety and stabilization and to be reassessed in the AM by psych. EDP Larene Pickett, PA-C and pt's nurse Junior, RN have been advised of the disposition. Pt will likely need SW consult due to homelessness and inability to care for self as pt is a double amputee.   Lind Covert, MSW, LCSW Therapeutic Triage Specialist  [phone]

## 2018-07-11 ENCOUNTER — Other Ambulatory Visit: Payer: Self-pay

## 2018-07-11 ENCOUNTER — Encounter (HOSPITAL_COMMUNITY): Payer: Self-pay | Admitting: Internal Medicine

## 2018-07-11 DIAGNOSIS — R21 Rash and other nonspecific skin eruption: Secondary | ICD-10-CM | POA: Diagnosis present

## 2018-07-11 DIAGNOSIS — M069 Rheumatoid arthritis, unspecified: Secondary | ICD-10-CM | POA: Diagnosis present

## 2018-07-11 DIAGNOSIS — Z89611 Acquired absence of right leg above knee: Secondary | ICD-10-CM | POA: Diagnosis not present

## 2018-07-11 DIAGNOSIS — Z590 Homelessness: Secondary | ICD-10-CM | POA: Diagnosis not present

## 2018-07-11 DIAGNOSIS — R45851 Suicidal ideations: Secondary | ICD-10-CM | POA: Diagnosis present

## 2018-07-11 DIAGNOSIS — Z8701 Personal history of pneumonia (recurrent): Secondary | ICD-10-CM | POA: Diagnosis not present

## 2018-07-11 DIAGNOSIS — F1414 Cocaine abuse with cocaine-induced mood disorder: Secondary | ICD-10-CM | POA: Diagnosis not present

## 2018-07-11 DIAGNOSIS — I739 Peripheral vascular disease, unspecified: Secondary | ICD-10-CM | POA: Diagnosis present

## 2018-07-11 DIAGNOSIS — I110 Hypertensive heart disease with heart failure: Secondary | ICD-10-CM | POA: Diagnosis present

## 2018-07-11 DIAGNOSIS — F17210 Nicotine dependence, cigarettes, uncomplicated: Secondary | ICD-10-CM | POA: Diagnosis present

## 2018-07-11 DIAGNOSIS — Z803 Family history of malignant neoplasm of breast: Secondary | ICD-10-CM | POA: Diagnosis not present

## 2018-07-11 DIAGNOSIS — Z681 Body mass index (BMI) 19 or less, adult: Secondary | ICD-10-CM | POA: Diagnosis not present

## 2018-07-11 DIAGNOSIS — Z89612 Acquired absence of left leg above knee: Secondary | ICD-10-CM | POA: Diagnosis not present

## 2018-07-11 DIAGNOSIS — I776 Arteritis, unspecified: Secondary | ICD-10-CM | POA: Diagnosis present

## 2018-07-11 DIAGNOSIS — D649 Anemia, unspecified: Secondary | ICD-10-CM | POA: Diagnosis present

## 2018-07-11 DIAGNOSIS — X58XXXA Exposure to other specified factors, initial encounter: Secondary | ICD-10-CM | POA: Diagnosis present

## 2018-07-11 DIAGNOSIS — Z89512 Acquired absence of left leg below knee: Secondary | ICD-10-CM | POA: Diagnosis not present

## 2018-07-11 DIAGNOSIS — Z888 Allergy status to other drugs, medicaments and biological substances status: Secondary | ICD-10-CM | POA: Diagnosis not present

## 2018-07-11 DIAGNOSIS — I160 Hypertensive urgency: Secondary | ICD-10-CM | POA: Diagnosis present

## 2018-07-11 DIAGNOSIS — I10 Essential (primary) hypertension: Secondary | ICD-10-CM | POA: Diagnosis not present

## 2018-07-11 DIAGNOSIS — Z89511 Acquired absence of right leg below knee: Secondary | ICD-10-CM | POA: Diagnosis not present

## 2018-07-11 DIAGNOSIS — F329 Major depressive disorder, single episode, unspecified: Secondary | ICD-10-CM | POA: Diagnosis present

## 2018-07-11 DIAGNOSIS — T374X5A Adverse effect of anthelminthics, initial encounter: Secondary | ICD-10-CM | POA: Diagnosis present

## 2018-07-11 DIAGNOSIS — Z811 Family history of alcohol abuse and dependence: Secondary | ICD-10-CM | POA: Diagnosis not present

## 2018-07-11 DIAGNOSIS — E440 Moderate protein-calorie malnutrition: Secondary | ICD-10-CM | POA: Diagnosis present

## 2018-07-11 DIAGNOSIS — I5032 Chronic diastolic (congestive) heart failure: Secondary | ICD-10-CM | POA: Diagnosis present

## 2018-07-11 DIAGNOSIS — F1424 Cocaine dependence with cocaine-induced mood disorder: Secondary | ICD-10-CM | POA: Diagnosis present

## 2018-07-11 DIAGNOSIS — Z800 Family history of malignant neoplasm of digestive organs: Secondary | ICD-10-CM | POA: Diagnosis not present

## 2018-07-11 DIAGNOSIS — R740 Nonspecific elevation of levels of transaminase and lactic acid dehydrogenase [LDH]: Secondary | ICD-10-CM | POA: Diagnosis present

## 2018-07-11 LAB — CBC
HEMATOCRIT: 36.4 % (ref 36.0–46.0)
HEMOGLOBIN: 11.4 g/dL — AB (ref 12.0–15.0)
MCH: 26 pg (ref 26.0–34.0)
MCHC: 31.3 g/dL (ref 30.0–36.0)
MCV: 82.9 fL (ref 78.0–100.0)
Platelets: 276 10*3/uL (ref 150–400)
RBC: 4.39 MIL/uL (ref 3.87–5.11)
RDW: 19.2 % — ABNORMAL HIGH (ref 11.5–15.5)
WBC: 6.2 10*3/uL (ref 4.0–10.5)

## 2018-07-11 LAB — COMPREHENSIVE METABOLIC PANEL
ALBUMIN: 3.8 g/dL (ref 3.5–5.0)
ALK PHOS: 137 U/L — AB (ref 38–126)
ALT: 88 U/L — AB (ref 0–44)
AST: 110 U/L — AB (ref 15–41)
Anion gap: 14 (ref 5–15)
BILIRUBIN TOTAL: 2.7 mg/dL — AB (ref 0.3–1.2)
BUN: 25 mg/dL — AB (ref 6–20)
CALCIUM: 9 mg/dL (ref 8.9–10.3)
CO2: 20 mmol/L — AB (ref 22–32)
Chloride: 101 mmol/L (ref 98–111)
Creatinine, Ser: 0.75 mg/dL (ref 0.44–1.00)
GFR calc Af Amer: >60 mL/min (ref >60)
GFR calc non Af Amer: >60 mL/min (ref >60)
GLUCOSE: 131 mg/dL — AB (ref 70–99)
Potassium: 3.5 mmol/L (ref 3.5–5.1)
SODIUM: 135 mmol/L (ref 135–145)
TOTAL PROTEIN: 7.5 g/dL (ref 6.5–8.1)

## 2018-07-11 LAB — C-REACTIVE PROTEIN: CRP: 1.5 mg/dL — ABNORMAL HIGH (ref <1.0)

## 2018-07-11 LAB — SEDIMENTATION RATE: Sed Rate: 8 mm/hr (ref 0–22)

## 2018-07-11 MED ORDER — ENOXAPARIN SODIUM 40 MG/0.4ML ~~LOC~~ SOLN: 40.0000 mg | Freq: Every day | SUBCUTANEOUS | Status: DC

## 2018-07-11 MED ORDER — AMLODIPINE BESYLATE 5 MG PO TABS: 10.0000 mg | ORAL_TABLET | Freq: Every day | ORAL | Status: DC

## 2018-07-11 MED ORDER — AMLODIPINE BESYLATE 10 MG PO TABS
10.0000 mg | ORAL_TABLET | Freq: Every day | ORAL | Status: DC
  Administered 2018-07-11 – 2018-07-13 (×3): 10 mg via ORAL
  Filled 2018-07-11: qty 2
  Filled 2018-07-11 (×3): qty 1

## 2018-07-11 MED ORDER — FUROSEMIDE 40 MG PO TABS
40.0000 mg | ORAL_TABLET | Freq: Every day | ORAL | Status: DC
  Administered 2018-07-11 – 2018-07-13 (×3): 40 mg via ORAL
  Filled 2018-07-11 (×4): qty 1

## 2018-07-11 MED ORDER — IBUPROFEN 200 MG PO TABS
400.0000 mg | ORAL_TABLET | Freq: Once | ORAL | Status: AC
  Administered 2018-07-11: 400 mg via ORAL
  Filled 2018-07-11: qty 2

## 2018-07-11 MED ORDER — HYDRALAZINE HCL 20 MG/ML IJ SOLN
5.0000 mg | INTRAMUSCULAR | Status: DC | PRN
  Administered 2018-07-11 (×2): 5 mg via INTRAVENOUS
  Filled 2018-07-11 (×2): qty 1

## 2018-07-11 MED ORDER — LOSARTAN POTASSIUM 25 MG PO TABS: 25.0000 mg | ORAL_TABLET | Freq: Every day | ORAL | Status: DC

## 2018-07-11 MED ORDER — LOSARTAN POTASSIUM 50 MG PO TABS
25.0000 mg | ORAL_TABLET | Freq: Every day | ORAL | Status: DC
  Administered 2018-07-11: 25 mg via ORAL
  Filled 2018-07-11: qty 1

## 2018-07-11 MED ORDER — ENOXAPARIN SODIUM 300 MG/3ML IJ SOLN
20.0000 mg | Freq: Every day | INTRAMUSCULAR | Status: DC
  Administered 2018-07-11 – 2018-07-13 (×3): 20 mg via SUBCUTANEOUS
  Filled 2018-07-11 (×3): qty 0.2

## 2018-07-11 MED ORDER — PANTOPRAZOLE SODIUM 40 MG PO TBEC
40.0000 mg | DELAYED_RELEASE_TABLET | Freq: Every day | ORAL | Status: DC
  Administered 2018-07-12 – 2018-07-13 (×2): 40 mg via ORAL
  Filled 2018-07-11 (×3): qty 1

## 2018-07-11 MED ORDER — METHYLPREDNISOLONE SODIUM SUCC 40 MG IJ SOLR
30.0000 mg | Freq: Every day | INTRAMUSCULAR | Status: DC
  Administered 2018-07-12: 30 mg via INTRAVENOUS
  Filled 2018-07-11 (×2): qty 1

## 2018-07-11 MED ORDER — FUROSEMIDE 40 MG PO TABS: 40.0000 mg | ORAL_TABLET | Freq: Every day | ORAL | Status: DC

## 2018-07-11 MED ORDER — METHYLPREDNISOLONE SODIUM SUCC 125 MG IJ SOLR
125.0000 mg | Freq: Once | INTRAMUSCULAR | Status: AC
  Administered 2018-07-11: 125 mg via INTRAVENOUS
  Filled 2018-07-11: qty 2

## 2018-07-11 NOTE — Progress Notes (Signed)
Pt has arrived from the ED to 1504 - orders as noted, pt is sleepy and without complaints. Vasculitis areas noted to rt arm / wrist , left arm & left AKA stump, areas have been marked with skin pen

## 2018-07-11 NOTE — H&P (Addendum)
History and Physical    [name] JOI:[id number] DOB: [birth year] DOA: 07/09/2018  PCP: Azzie Glatter, FNP  Patient coming from: Home.  Chief Complaint: Suicidal thoughts.  HPI: [name] is a 57 y.o. 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  Patient while staying in the ER was cleared by psychiatry.  It was found that patient started developing rash which were patchy and measured about 3 cm in diameter each initially in the right upper extremity and started to spread to her both stumps which were painful and appeared dark.  Concerning for vasculitis.  Patient was admitted in August for CHF and as noted taking her medications including blood pressure and Lasix since discharge.  Admits to taking cocaine.  ED Course: Labs revealed mild anemia CRP was 1.5.  Acetaminophen level and salicylate levels were negative.  Patient denies having had any overdose with drugs or anything.  Solu-Medrol 125 mg IV was given for possible vasculitis which is worsening.  Denies any mucosal symptoms and has no ulcers in the mucosa.  Denies any abdominal pain or any blood in the urine or any difficulty breathing.   Review of Systems: As per HPI, rest all negative.   Past Medical History:  Diagnosis Date  . CAP (community acquired pneumonia) 03/2014 X 2  . Cocaine abuse ([place])    ongoing with resultant vaculitis.  . Depression   . Headache    "weekly" (07/29/2016)  . Hypertension   . Inflammatory arthritis   . Migraines    "probably 5-6/yr" (07/29/2016)  . Normocytic anemia    BL Hgb 9.8-12. Last anemia panel 04/2010 - showing Fe 19, ferritin 101.  Pt on monthly B12 injections  . Rheumatoid arthritis(714.0)    patient reported  . VASCULITIS 04/17/2010   2/2  levimasole toxicity vs autoimmune d/o   ;  2/2 Levimasole toxicity. Followed by Dr. Louanne Skye    Past Surgical History:  Procedure Laterality Date  . AMPUTATION Left 05/22/2016   Procedure: AMPUTATION LEFT LONG FINGER;  Surgeon: Marybelle Killings, MD;  Location: [place];  Service: Orthopedics;  Laterality: Left;  . AMPUTATION Bilateral 04/10/2017   Procedure: AMPUTATION BELOW KNEE;  Surgeon: Newt Minion, MD;  Location: [place];  Service: Orthopedics;  Laterality: Bilateral;  . AMPUTATION Bilateral 02/06/2018   Procedure: AMPUTATION ABOVE KNEE;  Surgeon: Newt Minion, MD;  Location: [place];  Service: Orthopedics;  Laterality: Bilateral;  . HERNIA REPAIR     "stomach"  . I&D EXTREMITY Right 09/26/2015   Procedure: IRRIGATION AND DEBRIDEMENT LEG WOUND  VAC PLACEMENT.;  Surgeon: Loel Lofty Dillingham, DO;  Location: [place];  Service: Plastics;  Laterality: Right;  . INCISION AND DRAINAGE OF WOUND Bilateral 10/20/2016   Procedure: IRRIGATION AND DEBRIDEMENT WOUND BILATERAL;  Surgeon: Edrick Kins, DPM;  Location: [place];  Service: Podiatry;  Laterality: Bilateral;  . IRRIGATION AND DEBRIDEMENT ABSCESS Bilateral 09/26/2013   Procedure: DEBRIDEMENT ULCERS BILATERAL THIGHS;  Surgeon: Gwenyth Ober, MD;  Location: [place];  Service: General;  Laterality: Bilateral;  . SKIN BIOPSY Bilateral    shin nodules     reports that she has been smoking cigarettes. She has a 4.56 pack-year smoking history. She has never used smokeless tobacco. She reports that she has  current or past drug history. Drugs: "Crack" cocaine and Cocaine. She reports that she does not drink alcohol.  Allergies  Allergen Reactions  . Acetaminophen Swelling and Other (See Comments)    Reaction:  Eyelid swelling  . Lisinopril     UNSPECIFIED REACTION     Family History  Problem Relation Age of Onset  . Breast cancer Mother        Breast cancer  . Alcohol abuse Mother   . Colon cancer Maternal Aunt 14  . Alcohol abuse Father     Prior to  Admission medications   Medication Sig Start Date End Date Taking? Authorizing Provider  amLODipine (NORVASC) 10 MG tablet Take 1 tablet (10 mg total) by mouth daily. 06/23/18  Yes Patrecia Pour, Christean Grief, MD  furosemide (LASIX) 40 MG tablet Take 1 tablet (40 mg total) by mouth daily. 06/23/18  Yes Patrecia Pour, Christean Grief, MD  losartan (COZAAR) 25 MG tablet Take 1 tablet (25 mg total) by mouth daily. 06/23/18  Yes Doreatha Lew, MD    Physical Exam: Vitals:   07/10/18 1753 07/10/18 2151 07/11/18 0120 07/11/18 0258  BP: (!) 160/100  (!) 176/128 (!) 165/111  Pulse: 100  (!) 103 99  Resp: 16  (!) 24   Temp: 98 F (36.7 C)  98.9 F (37.2 C) (!) 97.5 F (36.4 C)  TempSrc: Oral  Oral   SpO2: 98%  96% 99%  Weight:  35.4 kg    Height:  5' 1"  (1.549 m)        Constitutional: Moderately built and nourished. Vitals:   07/10/18 1753 07/10/18 2151 07/11/18 0120 07/11/18 0258  BP: (!) 160/100  (!) 176/128 (!) 165/111  Pulse: 100  (!) 103 99  Resp: 16  (!) 24   Temp: 98 F (36.7 C)  98.9 F (37.2 C) (!) 97.5 F (36.4 C)  TempSrc: Oral  Oral   SpO2: 98%  96% 99%  Weight:  35.4 kg    Height:  5' 1"  (1.549 m)     Eyes: Anicteric no pallor. ENMT: No discharge from the ears eyes nose or mouth. Neck: No mass felt.  No neck rigidity. Respiratory: No rhonchi or crepitations. Cardiovascular: S1-S2 heard no murmurs appreciated. Abdomen: Soft nontender bowel sounds present. Musculoskeletal: Bilateral AKA. Skin: Skin rash which is a discrete measuring 3 cm circular tender to touch no obvious discharge in the right upper extremity and both stumps of the lower extremity. Neurologic: Alert awake oriented to time place and person.  Moves all extremities. Psychiatric: Appears normal per normal affect.  Denies any suicidal ideation at this time.   Labs on Admission: I have personally reviewed following labs and imaging studies  CBC: Recent Labs  Lab 07/10/18 0029  WBC 6.8  HGB 12.8  HCT 40.6    MCV 85.3  PLT 657   Basic Metabolic Panel: Recent Labs  Lab 07/10/18 0029  NA 139  K 4.4  CL 103  CO2 18*  GLUCOSE 67*  BUN 23*  CREATININE 0.82  CALCIUM 9.7   GFR: Estimated Creatinine Clearance: 43.3 mL/min (by C-G formula based on SCr of 0.82 mg/dL). Liver Function Tests: Recent Labs  Lab 07/10/18 0029  AST 54*  ALT 41  ALKPHOS 102  BILITOT 2.9*  PROT 7.5  ALBUMIN 4.2   No results for input(s): LIPASE, AMYLASE in the last 168 hours. No results for input(s): AMMONIA in the last 168 hours. Coagulation Profile: No results for input(s): INR, PROTIME in the last  168 hours. Cardiac Enzymes: No results for input(s): CKTOTAL, CKMB, CKMBINDEX, TROPONINI in the last 168 hours. BNP (last 3 results) No results for input(s): PROBNP in the last 8760 hours. HbA1C: No results for input(s): HGBA1C in the last 72 hours. CBG: Recent Labs  Lab 07/10/18 0026  GLUCAP 99   Lipid Profile: No results for input(s): CHOL, HDL, LDLCALC, TRIG, CHOLHDL, LDLDIRECT in the last 72 hours. Thyroid Function Tests: No results for input(s): TSH, T4TOTAL, FREET4, T3FREE, THYROIDAB in the last 72 hours. Anemia Panel: No results for input(s): VITAMINB12, FOLATE, FERRITIN, TIBC, IRON, RETICCTPCT in the last 72 hours. Urine analysis:    Component Value Date/Time   COLORURINE YELLOW 06/20/2018 2239   APPEARANCEUR CLEAR 06/20/2018 2239   LABSPEC 1.016 06/20/2018 2239   PHURINE 6.0 06/20/2018 2239   GLUCOSEU NEGATIVE 06/20/2018 2239   HGBUR SMALL (A) 06/20/2018 2239   BILIRUBINUR NEGATIVE 06/20/2018 2239   BILIRUBINUR small 04/29/2015 1533   KETONESUR NEGATIVE 06/20/2018 2239   PROTEINUR 30 (A) 06/20/2018 2239   UROBILINOGEN 1.0 07/26/2017 1550   NITRITE NEGATIVE 06/20/2018 2239   LEUKOCYTESUR NEGATIVE 06/20/2018 2239   Sepsis Labs: @LABRCNTIP (procalcitonin:4,lacticidven:4) )No results found for this or any previous visit (from the past 240 hour(s)).   Radiological Exams on  Admission: No results found.    Assessment/Plan Principal Problem:   Cocaine abuse with cocaine-induced mood disorder (HCC) Active Problems:   Essential hypertension   Cocaine use disorder, severe, dependence (HCC)   S/P bilateral below knee amputation (HCC)   Skin rash    1. Skin rashes concerning for vasculitis -admits to taking cocaine.  If it is vasculitis could be from [place] induced.  Will check anti-MPO antibodies.  Patient has been placed on Solu-Medrol 30 mg IV daily and has received 125 mg in the ER.  Closely observe.  Will hold off patient's ARB for now but patient has not been taking it for last 2 months.  Patient does not have any joint pains shortness of breath or any blood in the urine or bowel. 2. Hypertensive urgency -we will continue patient's amlodipine and Lasix.  PRN IV hydralazine Cozaar will be held until we can find it was a cause for the skin rash.  3. History of diastolic CHF last EF measured in August 2019 60 to 65% with grade 2 diastolic dysfunction.  Denies any shortness of breath appears compensated will place patient on Lasix for now.  Closely follow intake output and volume status. 4. Cocaine abuse -advised about quitting.  Social work consult. 5. Mild anemia -follow CBC.   DVT prophylaxis: Lovenox. Code Status: Full code. Family Communication: Discussed with patient. Disposition Plan: Home. Consults called: None. Admission status: Observation.   Rise Patience MD [facility] Pager [phone].  If 7PM-7AM, please contact night-coverage [email or web address] Password [email or web address]  07/11/2018, 3:28 AM

## 2018-07-11 NOTE — ED Notes (Signed)
ED TO INPATIENT HANDOFF REPORT  Name/Age/Gender [name] 57 y.o. female  Code Status    Code Status Orders  (From admission, onward)         Start     Ordered   07/10/18 0130  Full code  Continuous     07/10/18 0130        Code Status History    Date Active Date Inactive Code Status Order ID Comments User Context   06/21/2018 0247 06/23/2018 2203 Full Code [id number]  Etta Quill, DO ED   05/20/2018 0145 05/23/2018 1806 Full Code [id number]  Bethena Roys, MD Inpatient   02/02/2018 1830 03/03/2018 0033 Full Code [id number]  Annita Brod, MD Inpatient   02/02/2018 1325 02/02/2018 1830 Full Code [id number]  Jola Schmidt, MD ED   10/23/2017 1300 10/26/2017 2237 Full Code [id number]  Ina Homes, MD ED   08/09/2017 2116 08/11/2017 1245 Full Code [id number]  Street, Yonah, PA-C ED   06/05/2017 1510 06/06/2017 2155 Full Code [id number]  Elwin Mocha, MD ED   04/13/2017 1822 04/17/2017 1139 Full Code [id number]  Cathlyn Parsons, PA-C Inpatient   04/13/2017 1822 04/13/2017 1822 Full Code [id number]  Cathlyn Parsons, PA-C Inpatient   04/08/2017 0835 04/13/2017 1817 Full Code [id number]  Lavina Hamman, MD ED   03/30/2017 1851 04/01/2017 2024 Full Code [id number]  Verlee Monte, MD Inpatient   02/06/2017 0605 02/08/2017 2031 Full Code [id number]  Riccardo Dubin, MD ED   12/22/2016 2131 12/28/2016 2234 Full Code [id number]  Burgess Estelle, MD Inpatient   10/14/2016 1525 10/24/2016 2253 Full Code [id number]  Jule Ser, DO Inpatient   09/21/2016 0107 09/22/2016 1910 Full Code [id number]  Junius Creamer, NP ED   09/09/2016 1722 09/11/2016 1701 Full Code [id number]  Juliet Rude, MD Inpatient   07/25/2016 0525 07/29/2016 2120 Full Code [id number]  Juliet Rude, MD Inpatient   06/14/2016 1513 06/19/2016 1931 Full Code [id number]  Florinda Marker, MD Inpatient   05/18/2016 1959 05/23/2016 1717 Full Code [id number]  Juliet Rude, MD Inpatient   04/27/2016 0911 05/01/2016 1916 Full Code [id number]  Florinda Marker, MD Inpatient   04/01/2016 1727 04/04/2016 1651 Full Code [id number]  Jones Bales, MD Inpatient   11/14/2015 2316 11/15/2015 1729 Full Code [id number]  Juluis Mire, MD ED   10/23/2015 1615 10/29/2015 1636 Full Code [id number]  Juliet Rude, MD Inpatient   09/25/2015 0536 10/02/2015 1911 Full Code [id number]  Riccardo Dubin, MD ED   06/15/2015 0147 06/17/2015 2039 Full Code [id number]  Vickii Chafe, MD Inpatient   06/04/2015 2045 06/05/2015 2133 Full Code [id number]  Corky Sox, MD Inpatient   04/21/2015 1405 04/22/2015 2225 Full Code [id number]  Corky Sox, MD Inpatient   04/08/2015 0101 04/08/2015 2015 Full Code [id number]  Dellia Nims, MD Inpatient   03/09/2015 2135 03/12/2015 0031 Full Code [id number]  Karlene Einstein, MD Inpatient   01/14/2015 1734 01/17/2015 0110 Full Code [id number]  Juliet Rude, MD Inpatient   10/09/2014 2245 10/17/2014 1738 Full Code [id number]  Laverle Hobby, PA-C Inpatient   10/09/2014 0300 10/09/2014 2245 Full Code [id number]  Everlene Balls, MD ED   05/07/2014 1709 05/08/2014 1923 Full Code [id number]  Benjamine Mola, [place] Inpatient   05/07/2014 0835 05/07/2014 1709 Full Code [id number]  Ernestina Patches, MD ED   05/07/2014 0108 05/07/2014 0835 Full Code [id number]  Carmin Muskrat, MD ED   04/19/2014 1726 04/22/2014 1422 Full  Code [id number]  Clinton Gallant, MD Inpatient   03/30/2014 0327 03/31/2014 2008 Full Code [id number]  Cresenciano Genre Inpatient   01/13/2014 1823 01/26/2014 1651 Full Code [id number]  Waylan Boga, NP Inpatient   01/13/2014 0457 01/13/2014 1823 Full Code [id number]  Garald Balding, NP ED   10/11/2013 0450 10/17/2013 2005 Full Code [id number]  Blain Pais, MD ED   09/24/2013 1852 10/02/2013 1807 Full Code [id number]  Blain Pais, MD Inpatient   08/30/2013 0406 08/30/2013 2340 Full Code [id number]  Martie Lee, PA-C ED   06/23/2013 2105 06/28/2013 1756 Full Code [id number]  Delfin Gant, NP Inpatient   06/22/2013 1601 06/23/2013 2105 Full Code [id number]   Mariea Clonts, MD ED   06/13/2013 2340 06/14/2013 2045 Full Code [id number]  Othella Boyer, MD Inpatient   05/13/2013 1123 05/14/2013 2124 Full Code [id number]  Waynetta Pean ED   02/13/2012 1603 02/15/2012 1829 Full Code [id number]  Shary Decamp, RN Inpatient   10/14/2011 1306 10/14/2011 1306 DNR [id number]  Thera Flake, MD Inpatient      Home/SNF/Other Home  Chief Complaint si  Level of Care/Admitting Diagnosis ED Disposition    ED Disposition Condition [facility] Area: [facility] [[id number]]  Level of Care: Med-Surg [16]  Diagnosis: Skin rash [383338]  Admitting Physician: Rise Patience [phone]  Attending Physician: Rise Patience [[id number]]  PT Class (Do Not Modify): Observation [104]  PT Acc Code (Do Not Modify): Observation [10022]       Medical History Past Medical History:  Diagnosis Date  . CAP (community acquired pneumonia) 03/2014 X 2  . Cocaine abuse ([place])    ongoing with resultant vaculitis.  . Depression   . Headache    "weekly" (07/29/2016)  . Hypertension   . Inflammatory arthritis   . Migraines    "probably 5-6/yr" (07/29/2016)  . Normocytic anemia    BL Hgb 9.8-12. Last anemia panel 04/2010 - showing Fe 19, ferritin 101.  Pt on monthly B12 injections  . Rheumatoid arthritis(714.0)    patient reported  . 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 Followed by Dr. Louanne Skye    Allergies Allergies  Allergen Reactions  . Acetaminophen Swelling and Other (See Comments)    Reaction:  Eyelid swelling  . Lisinopril     UNSPECIFIED REACTION     IV Location/Drains/Wounds Patient Lines/Drains/Airways Status   Active Line/Drains/Airways    Name:   Placement date:   Placement time:   Site:   Days:   Peripheral IV 07/11/18 Left;Lateral Wrist   07/11/18    0210    Wrist   less than 1   External Urinary Catheter   02/04/18    0550    -   157   Incision (Closed)  02/06/18 Thigh Left   02/06/18    0809     155   Incision (Closed) 02/06/18 Thigh Right   02/06/18    0809     155   Wound / Incision (Open or Dehisced) 02/02/18 Other (Comment) Knee Bilateral;Distal   02/02/18    1636    Knee   159   Wound / Incision (Open or Dehisced) 02/02/18 Other (Comment) Sacrum Mid   02/02/18    1636    Sacrum   159   Wound / Incision (Open or Dehisced) 02/02/18 Other (Comment) Ear Bilateral   02/02/18  1636    Ear   159          Labs/Imaging Results for orders placed or performed during the hospital encounter of 07/09/18 (from the past 48 hour(s))  hCG, quantitative, pregnancy     Status: Abnormal   Collection Time: 07/10/18 12:20 AM  Result Value Ref Range   hCG, Beta Chain, Quant, S 6 (H) <5 mIU/mL    Comment:          GEST. AGE      CONC.  (mIU/mL)   <=1 WEEK        5 - 50     2 WEEKS       50 - 500     3 WEEKS       100 - 10,000     4 WEEKS     1,000 - 30,000     5 WEEKS     3,500 - 115,000   6-8 WEEKS     12,000 - 270,000    12 WEEKS     15,000 - 220,000        FEMALE AND NON-PREGNANT FEMALE:     LESS THAN 5 mIU/mL Performed at [facility], [place] [address], [place], [place] [address]   CBG monitoring, ED     Status: None   Collection Time: 07/10/18 12:26 AM  Result Value Ref Range   Glucose-Capillary 99 70 - 99 mg/dL  Comprehensive metabolic panel     Status: Abnormal   Collection Time: 07/10/18 12:29 AM  Result Value Ref Range   Sodium 139 135 - 145 mmol/L   Potassium 4.4 3.5 - 5.1 mmol/L   Chloride 103 98 - 111 mmol/L   CO2 18 (L) 22 - 32 mmol/L   Glucose, Bld 67 (L) 70 - 99 mg/dL   BUN 23 (H) 6 - 20 mg/dL   Creatinine, Ser 0.82 0.44 - 1.00 mg/dL   Calcium 9.7 8.9 - 10.3 mg/dL   Total Protein 7.5 6.5 - 8.1 g/dL   Albumin 4.2 3.5 - 5.0 g/dL   AST 54 (H) 15 - 41 U/L   ALT 41 0 - 44 U/L   Alkaline Phosphatase 102 38 - 126 U/L   Total Bilirubin 2.9 (H) 0.3 - 1.2 mg/dL   GFR calc non Af Amer >60 >60 mL/min   GFR calc Af  Amer >60 >60 mL/min    Comment: (NOTE) The eGFR has been calculated using the CKD EPI equation. This calculation has not been validated in all clinical situations. eGFR's persistently <60 mL/min signify possible Chronic Kidney Disease.    Anion gap 18 (H) 5 - 15    Comment: Performed at [facility], [place] [address], [place], [place] [address]  Ethanol     Status: None   Collection Time: 07/10/18 12:29 AM  Result Value Ref Range   Alcohol, Ethyl (B) <10 <10 mg/dL    Comment: (NOTE) Lowest detectable limit for serum alcohol is 10 mg/dL. For medical purposes only. Performed at [facility], [place] [address] [place], [place] [address]   Salicylate level     Status: None   Collection Time: 07/10/18 12:29 AM  Result Value Ref Range   Salicylate Lvl <8.1 2.8 - 30.0 mg/dL    Comment: Performed at [facility], [place] [address], [place], [place] [address]  Acetaminophen level     Status: Abnormal   Collection Time: 07/10/18 12:29 AM  Result Value Ref Range  Acetaminophen (Tylenol), Serum <10 (L) 10 - 30 ug/mL    Comment: (NOTE) Therapeutic concentrations vary significantly. A range of 10-30 ug/mL  may be an effective concentration for many patients. However, some  are best treated at concentrations outside of this range. Acetaminophen concentrations >150 ug/mL at 4 hours after ingestion  and >50 ug/mL at 12 hours after ingestion are often associated with  toxic reactions. Performed at [facility], [place] [address], [place], [place] [address]   cbc     Status: Abnormal   Collection Time: 07/10/18 12:29 AM  Result Value Ref Range   WBC 6.8 4.0 - 10.5 K/uL   RBC 4.76 3.87 - 5.11 MIL/uL   Hemoglobin 12.8 12.0 - 15.0 g/dL   HCT 40.6 36.0 - 46.0 %   MCV 85.3 78.0 - 100.0 fL   MCH 26.9 26.0 - 34.0 pg   MCHC 31.5 30.0 - 36.0 g/dL   RDW 18.9 (H) 11.5 - 15.5 %   Platelets 260 150 - 400 K/uL    Comment: Performed  at [facility], [place] [address], [place], [place] [address]  Rapid urine drug screen (hospital performed)     Status: Abnormal   Collection Time: 07/10/18  1:18 AM  Result Value Ref Range   Opiates NONE DETECTED NONE DETECTED   Cocaine POSITIVE (A) NONE DETECTED   Benzodiazepines NONE DETECTED NONE DETECTED   Amphetamines NONE DETECTED NONE DETECTED   Tetrahydrocannabinol NONE DETECTED NONE DETECTED   Barbiturates NONE DETECTED NONE DETECTED    Comment: (NOTE) DRUG SCREEN FOR MEDICAL PURPOSES ONLY.  IF CONFIRMATION IS NEEDED FOR ANY PURPOSE, NOTIFY LAB WITHIN 5 DAYS. LOWEST DETECTABLE LIMITS FOR URINE DRUG SCREEN Drug Class                     Cutoff (ng/mL) Amphetamine and metabolites    1000 Barbiturate and metabolites    200 Benzodiazepine                 245 Tricyclics and metabolites     300 Opiates and metabolites        300 Cocaine and metabolites        300 THC                            50 Performed at [facility], [place] [address] [place],  [address]    No results found.  Pending Labs Unresulted Labs (From admission, onward)    Start     Ordered   07/11/18 0500  CBC  Tomorrow morning,   R     07/10/18 2322   07/11/18 0500  Comprehensive metabolic panel  Tomorrow morning,   R     07/10/18 2322   07/11/18 0500  Sedimentation rate  Tomorrow morning,   R     07/10/18 2322   07/11/18 0500  C-reactive protein  Tomorrow morning,   R     07/10/18 2322          Vitals/Pain Today's Vitals   07/10/18 2151 07/10/18 2220 07/10/18 2340 07/11/18 0120  BP:    (!) 176/128  Pulse:    (!) 103  Resp:    (!) 24  Temp:    98.9 F (37.2 C)  TempSrc:    Oral  SpO2:    96%  Weight: 35.4 kg     Height: 5' 1" (1.549 m)  PainSc:  Asleep 8      Isolation Precautions No active isolations  Medications Medications  gabapentin (NEURONTIN) capsule 100 mg (100 mg Oral Given 07/10/18 2131)  amLODipine (NORVASC) tablet 10 mg (has  no administration in time range)  losartan (COZAAR) tablet 25 mg (has no administration in time range)  furosemide (LASIX) tablet 40 mg (has no administration in time range)  methylPREDNISolone sodium succinate (SOLU-MEDROL) 125 mg/2 mL injection 125 mg (has no administration in time range)  LORazepam (ATIVAN) tablet 1 mg (1 mg Oral Given 07/10/18 0133)  oxyCODONE (Oxy IR/ROXICODONE) immediate release tablet 5 mg (5 mg Oral Given 07/10/18 2131)  predniSONE (DELTASONE) tablet 60 mg (60 mg Oral Given 07/10/18 2338)    Mobility manual wheelchair

## 2018-07-11 NOTE — ED Notes (Signed)
Dr. Leonides Schanz informed of pt's VS.  New orders received.  See orders.

## 2018-07-11 NOTE — Progress Notes (Signed)
Patient seen and examined at bedside, patient admitted after midnight, please see earlier detailed admission note by Rise Patience, MD. Briefly, patient presented with suicidal thoughts and was cleared by psychiatry but was found to have concern for vasculitis. Patient started on steroids.   Cordelia Poche, MD [facility] 07/11/2018, 2:04 PM

## 2018-07-12 ENCOUNTER — Encounter (HOSPITAL_COMMUNITY): Payer: Self-pay | Admitting: General Surgery

## 2018-07-12 DIAGNOSIS — Z89512 Acquired absence of left leg below knee: Secondary | ICD-10-CM

## 2018-07-12 DIAGNOSIS — Z89511 Acquired absence of right leg below knee: Secondary | ICD-10-CM

## 2018-07-12 LAB — COMPREHENSIVE METABOLIC PANEL
ALK PHOS: 106 U/L (ref 38–126)
ALT: 85 U/L — AB (ref 0–44)
AST: 79 U/L — ABNORMAL HIGH (ref 15–41)
Albumin: 3.4 g/dL — ABNORMAL LOW (ref 3.5–5.0)
Anion gap: 10 (ref 5–15)
BILIRUBIN TOTAL: 1.7 mg/dL — AB (ref 0.3–1.2)
BUN: 25 mg/dL — AB (ref 6–20)
CALCIUM: 8.7 mg/dL — AB (ref 8.9–10.3)
CHLORIDE: 105 mmol/L (ref 98–111)
CO2: 23 mmol/L (ref 22–32)
CREATININE: 0.89 mg/dL (ref 0.44–1.00)
GFR calc non Af Amer: >60 mL/min (ref >60)
Glucose, Bld: 140 mg/dL — ABNORMAL HIGH (ref 70–99)
Potassium: 3.2 mmol/L — ABNORMAL LOW (ref 3.5–5.1)
Sodium: 138 mmol/L (ref 135–145)
Total Protein: 6.6 g/dL (ref 6.5–8.1)

## 2018-07-12 LAB — MPO/PR-3 (ANCA) ANTIBODIES: Myeloperoxidase Abs: <9 U/mL (ref 0.0–9.0)

## 2018-07-12 MED ORDER — LIDOCAINE HCL 2 % IJ SOLN
5.0000 mL | INTRAMUSCULAR | Status: AC
  Administered 2018-07-12: 100 mg via INTRADERMAL
  Filled 2018-07-12: qty 20

## 2018-07-12 MED ORDER — IBUPROFEN 200 MG PO TABS
400.0000 mg | ORAL_TABLET | Freq: Once | ORAL | Status: AC
  Administered 2018-07-12: 400 mg via ORAL
  Filled 2018-07-12: qty 2

## 2018-07-12 MED ORDER — TRAMADOL HCL 50 MG PO TABS
50.0000 mg | ORAL_TABLET | Freq: Four times a day (QID) | ORAL | Status: AC | PRN
  Administered 2018-07-12 – 2018-07-13 (×2): 50 mg via ORAL
  Filled 2018-07-12 (×2): qty 1

## 2018-07-12 NOTE — Progress Notes (Signed)
PROGRESS NOTE    [name]  AVW:[id number] DOB: 04-22-1961 DOA: 07/09/2018 PCP: Azzie Glatter, FNP   Brief Narrative: [name] is a 57 y.o. 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 She presented secondary to suicidal ideation and developed multiple areas of rash concerning for recurrent vasculitis.   Assessment & Plan:   Principal Problem:   Cocaine abuse with cocaine-induced mood disorder (HCC) Active Problems:   Essential hypertension   Cocaine use disorder, severe, dependence ([place])   S/P bilateral below knee amputation (HCC)   Skin rash   Skin rash Concern for levamisole induced vasculitis. Patient uses cocaine. Rash has developed some bullae. -Continue IV steroids -Consult general surgery for biopsy  Cocaine abuse Patient states that she has decided to quit using as she is tired of continually needing to be in the hospital. -Social work consult  Homelessness -Social work consult  Bilateral AKA -OT eval  Chronic diastolic heart failure Grade 2 diastolic dysfunction. Euvolemic.  Elevated AST/ALT Trending up. Unknown etiology. Also with elevated alkaline phosphatase. -Repeat CMP. If continuing to rise, will get imaging. No symptoms on exam   DVT prophylaxis: Lovenox Code Status:   Code Status: Full Code Family Communication: None Disposition Plan: Discharge likely in 24 hours   Consultants:   General surgery  Procedures:   None  Antimicrobials:  None    Subjective: No issues overnight.  Objective: Vitals:   07/11/18 1459 07/11/18 2106 07/11/18 2254 07/12/18 0346  BP: 135/86 (!) 166/96 (!) 145/85 (!) 153/97  Pulse: 98 99  94  Resp: 20 (!) 23  20  Temp: 97.7 F (36.5 C) 97.7 F (36.5 C)  97.9 F (36.6 C)  TempSrc: Oral Oral  Oral  SpO2: 100% 99%  100%   Weight:      Height:        Intake/Output Summary (Last 24 hours) at 07/12/2018 1212 Last data filed at 07/12/2018 0832 Gross per 24 hour  Intake 1044 ml  Output -  Net 1044 ml   Filed Weights   07/10/18 2151 07/11/18 0400  Weight: 35.4 kg 37.5 kg    Examination:  General exam: Appears calm and comfortable Respiratory system: Clear to auscultation. Respiratory effort normal. Cardiovascular system: S1 & S2 heard, RRR. No murmurs. Gastrointestinal system: Abdomen is nondistended, soft and nontender. Normal bowel sounds heard. Central nervous system: Alert and oriented. No focal neurological deficits. Extremities: Bilateral AKA. No calf tenderness Skin: No cyanosis. Nummular rash, bullae formation, no erythema Psychiatry: Judgement and insight appear normal. Mood & affect appropriate.     Data Reviewed: I have personally reviewed following labs and imaging studies  CBC: Recent Labs  Lab 07/10/18 0029 07/11/18 0518  WBC 6.8 6.2  HGB 12.8 11.4*  HCT 40.6 36.4  MCV 85.3 82.9  PLT 260 537   Basic Metabolic Panel: Recent Labs  Lab 07/10/18 0029 07/11/18 0518  NA 139 135  K 4.4 3.5  CL 103 101  CO2 18* 20*  GLUCOSE 67* 131*  BUN 23* 25*  CREATININE 0.82 0.75  CALCIUM 9.7 9.0   GFR: Estimated Creatinine Clearance: 47 mL/min (by C-G formula based on SCr of 0.75 mg/dL). Liver Function Tests: Recent Labs  Lab 07/10/18 0029 07/11/18 0518  AST 54* 110*  ALT 41 88*  ALKPHOS 102 137*  BILITOT 2.9* 2.7*  PROT 7.5 7.5  ALBUMIN 4.2 3.8   No results for input(s): LIPASE, AMYLASE in the last 168 hours. No results for input(s): AMMONIA in the last 168 hours. Coagulation Profile: No results for input(s): INR, PROTIME in the last 168 hours. Cardiac Enzymes: No results for input(s): CKTOTAL, CKMB, CKMBINDEX, TROPONINI in the last 168 hours. BNP (last 3 results) No results for input(s): PROBNP in the last 8760 hours. HbA1C: No results for input(s): HGBA1C in the  last 72 hours. CBG: Recent Labs  Lab 07/10/18 0026  GLUCAP 99   Lipid Profile: No results for input(s): CHOL, HDL, LDLCALC, TRIG, CHOLHDL, LDLDIRECT in the last 72 hours. Thyroid Function Tests: No results for input(s): TSH, T4TOTAL, FREET4, T3FREE, THYROIDAB in the last 72 hours. Anemia Panel: No results for input(s): VITAMINB12, FOLATE, FERRITIN, TIBC, IRON, RETICCTPCT in the last 72 hours. Sepsis Labs: No results for input(s): PROCALCITON, LATICACIDVEN in the last 168 hours.  No results found for this or any previous visit (from the past 240 hour(s)).       Radiology Studies: No results found.      Scheduled Meds: . amLODipine  10 mg Oral Daily  . enoxaparin (LOVENOX) injection  20 mg Subcutaneous Daily  . furosemide  40 mg Oral Daily  . gabapentin  100 mg Oral BID  . methylPREDNISolone (SOLU-MEDROL) injection  30 mg Intravenous Daily  . pantoprazole  40 mg Oral Daily   Continuous Infusions:   LOS: 1 day     Cordelia Poche, MD [facility] 07/12/2018, 12:12 PM Pager: [phone]  If 7PM-7AM, please contact night-coverage [email or web address] 07/12/2018, 12:12 PM

## 2018-07-12 NOTE — Consult Note (Signed)
[name] Mar 30, 1961  [id number].    Requesting MD: Dr. Gardenia Phlegm Chief Complaint/Reason for Consult: levamisole induced vasculitis, punch bx requested  HPI:  This is a 57 yo black female with multiple medical problems who was admitted secondary to suicidal ideations.  She has been cleared by psych.  She admits to doing smoking cocaine.  She was found to have several scattered patchy areas of partial skin necrosis on her arms and her stumps.  We have been asked to see her to perform a punch biopsy to confirm if this is vasculitis secondary to her cocaine abuse.  ROS: ROS: Please see HPI, otherwise all other systems are currently negative.  Family History  Problem Relation Age of Onset  . Breast cancer Mother        Breast cancer  . Alcohol abuse Mother   . Colon cancer Maternal Aunt 69  . Alcohol abuse Father     Past Medical History:  Diagnosis Date  . CAP (community acquired pneumonia) 03/2014 X 2  . Cocaine abuse ([place])    ongoing with resultant vaculitis.  . Depression   . Headache    "weekly" (07/29/2016)  . Hypertension   . Inflammatory arthritis   . Migraines    "probably 5-6/yr" (07/29/2016)  . Normocytic anemia    BL Hgb 9.8-12. Last anemia panel 04/2010 - showing Fe 19, ferritin 101.  Pt on monthly B12 injections  . Rheumatoid arthritis(714.0)    patient reported  . 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 Followed by Dr. Louanne Skye    Past Surgical History:  Procedure Laterality Date  . AMPUTATION Left 05/22/2016   Procedure: AMPUTATION LEFT LONG FINGER;  Surgeon: Marybelle Killings, MD;  Location: [place] [facility];  Service: Orthopedics;  Laterality: Left;  . AMPUTATION Bilateral 04/10/2017   Procedure: AMPUTATION BELOW KNEE;  Surgeon: Newt Minion, MD;  Location: [place];  Service: Orthopedics;  Laterality: Bilateral;  . AMPUTATION Bilateral 02/06/2018   Procedure: AMPUTATION ABOVE KNEE;  Surgeon: Newt Minion, MD;   Location: [facility];  Service: Orthopedics;  Laterality: Bilateral;  . HERNIA REPAIR     "stomach"  . I&D EXTREMITY Right 09/26/2015   Procedure: IRRIGATION AND DEBRIDEMENT LEG WOUND  VAC PLACEMENT.;  Surgeon: Loel Lofty Dillingham, DO;  Location: [facility];  Service: Plastics;  Laterality: Right;  . INCISION AND DRAINAGE OF WOUND Bilateral 10/20/2016   Procedure: IRRIGATION AND DEBRIDEMENT WOUND BILATERAL;  Surgeon: Edrick Kins, DPM;  Location: [facility];  Service: Podiatry;  Laterality: Bilateral;  . IRRIGATION AND DEBRIDEMENT ABSCESS Bilateral 09/26/2013   Procedure: DEBRIDEMENT ULCERS BILATERAL THIGHS;  Surgeon: Gwenyth Ober, MD;  Location: [facility];  Service: General;  Laterality: Bilateral;  . SKIN BIOPSY Bilateral    shin nodules    Social History:  reports that she has been smoking cigarettes. She has a 4.56 pack-year smoking history. She has never used smokeless tobacco. She reports that she has current or past drug history. Drugs: "Crack" cocaine and Cocaine. She reports that she does not drink alcohol.  Allergies:  Allergies  Allergen Reactions  . Acetaminophen Swelling and Other (See Comments)    Reaction:  Eyelid swelling  . Lisinopril     UNSPECIFIED REACTION     Medications Prior to Admission  Medication Sig Dispense Refill  . amLODipine (NORVASC) 10 MG tablet Take 1 tablet (10 mg total) by mouth daily. 30 tablet  0  . furosemide (LASIX) 40 MG tablet Take 1 tablet (40 mg total) by mouth daily. 30 tablet 0  . losartan (COZAAR) 25 MG tablet Take 1 tablet (25 mg total) by mouth daily. 30 tablet 0     Physical Exam: Blood pressure (!) 153/97, pulse 94, temperature 97.9 F (36.6 C), temperature source Oral, resp. rate 20, height 5' 1"  (1.549 m), weight 37.5 kg, SpO2 100 %. General: pleasant, black female who is laying in bed in NAD HEENT: head is normocephalic, atraumatic.  Sclera are noninjected.  PERRL.  Ears and nose without any masses or lesions.  Mouth is pink and moist Heart:  regular, rate, and rhythm.  Normal s1,s2. No obvious gallops, or rubs noted. + murmur  Palpable radial pulses bilaterally Lungs: CTAB, no wheezes, rhonchi, or rales noted.  Respiratory effort nonlabored Abd: soft, NT, ND, +BS, no masses, hernias, or organomegaly MS: upper extremities are symmetrical with no cyanosis, clubbing, or edema.  She has B AKA stumps Skin: warm and dry with no masses.  She does however have scattered partial thickness necrotic type appearing lesions on her upper extremities as well as her stumps.  See procedure note for bx details. Psych: A&Ox3 with an appropriate affect.   Results for orders placed or performed during the hospital encounter of 07/09/18 (from the past 48 hour(s))  CBC     Status: Abnormal   Collection Time: 07/11/18  5:18 AM  Result Value Ref Range   WBC 6.2 4.0 - 10.5 K/uL   RBC 4.39 3.87 - 5.11 MIL/uL   Hemoglobin 11.4 (L) 12.0 - 15.0 g/dL   HCT 36.4 36.0 - 46.0 %   MCV 82.9 78.0 - 100.0 fL   MCH 26.0 26.0 - 34.0 pg   MCHC 31.3 30.0 - 36.0 g/dL   RDW 19.2 (H) 11.5 - 15.5 %   Platelets 276 150 - 400 K/uL    Comment: Performed at [facility], [place] [address], [place], [place] [address]  Comprehensive metabolic panel     Status: Abnormal   Collection Time: 07/11/18  5:18 AM  Result Value Ref Range   Sodium 135 135 - 145 mmol/L   Potassium 3.5 3.5 - 5.1 mmol/L    Comment: DELTA CHECK NOTED   Chloride 101 98 - 111 mmol/L   CO2 20 (L) 22 - 32 mmol/L   Glucose, Bld 131 (H) 70 - 99 mg/dL   BUN 25 (H) 6 - 20 mg/dL   Creatinine, Ser 0.75 0.44 - 1.00 mg/dL   Calcium 9.0 8.9 - 10.3 mg/dL   Total Protein 7.5 6.5 - 8.1 g/dL   Albumin 3.8 3.5 - 5.0 g/dL   AST 110 (H) 15 - 41 U/L   ALT 88 (H) 0 - 44 U/L   Alkaline Phosphatase 137 (H) 38 - 126 U/L   Total Bilirubin 2.7 (H) 0.3 - 1.2 mg/dL   GFR calc non Af Amer >60 >60 mL/min   GFR calc Af Amer >60 >60 mL/min    Comment: (NOTE) The eGFR has been calculated using the CKD EPI  equation. This calculation has not been validated in all clinical situations. eGFR's persistently <60 mL/min signify possible Chronic Kidney Disease.    Anion gap 14 5 - 15    Comment: Performed at [facility], [place] [address] [place], [place] [address]  Sedimentation rate     Status: None   Collection Time: 07/11/18  5:18 AM  Result Value Ref Range  Sed Rate 8 0 - 22 mm/hr    Comment: Performed at [facility], [place] [address] [place], [place] [address]  C-reactive protein     Status: Abnormal   Collection Time: 07/11/18  5:18 AM  Result Value Ref Range   CRP 1.5 (H) <1.0 mg/dL    Comment: Performed at [facility], [place] [address], [place], [place] [address]   No results found.    Assessment/Plan Scattered partial thickness necrotic lesions, ? Levamisole induced vasculitis -punch biopsy performed and taken personally to the lab -dry dressing in place -will check site tomorrow -further care per primary service.   Henreitta Cea, [facility] Surgery 07/12/2018, 11:55 AM Pager: [phone]

## 2018-07-12 NOTE — Procedures (Signed)
Punch Biopsy Procedure Note  Pre-operative Diagnosis: vasculitis  Post-operative Diagnosis: same  Indications: This is a 57 yo black female with a history of cocaine abuse and scattered possible necrotic areas concerning for levamisole induced vasculitis secondary to cocaine abuse.  Anesthesia: 2% plain lidocaine  Procedure Details  The procedure, risks and complications have been discussed in detail (including, but not limited to infection, bleeding) with the patient, and the patient has given verbal consent to the procedure.  The skin was sterilely prepped and draped over the affected area in the usual fashion. After adequate local anesthesia, I&D with a 85m punch biopsy was performed on the left thigh AKA stump. Procedure tolerated well.  Dry dressing applied after hemostasis achieved The patient was observed until stable.  Findings: Scattered patchy areas of possible partial thickness necrosis  EBL: none   Condition: Tolerated procedure well and Stable   Complications: none.  KHenreitta Cea11:55 AM 07/12/2018

## 2018-07-13 LAB — COMPREHENSIVE METABOLIC PANEL
ALK PHOS: 101 U/L (ref 38–126)
ALT: 68 U/L — ABNORMAL HIGH (ref 0–44)
ANION GAP: 12 (ref 5–15)
AST: 49 U/L — ABNORMAL HIGH (ref 15–41)
Albumin: 3.5 g/dL (ref 3.5–5.0)
BILIRUBIN TOTAL: 1.6 mg/dL — AB (ref 0.3–1.2)
BUN: 36 mg/dL — ABNORMAL HIGH (ref 6–20)
CALCIUM: 9.3 mg/dL (ref 8.9–10.3)
CO2: 22 mmol/L (ref 22–32)
Chloride: 107 mmol/L (ref 98–111)
Creatinine, Ser: 0.79 mg/dL (ref 0.44–1.00)
GFR calc non Af Amer: >60 mL/min (ref >60)
Glucose, Bld: 97 mg/dL (ref 70–99)
Potassium: 3.1 mmol/L — ABNORMAL LOW (ref 3.5–5.1)
SODIUM: 141 mmol/L (ref 135–145)
TOTAL PROTEIN: 6.8 g/dL (ref 6.5–8.1)

## 2018-07-13 MED ORDER — PREDNISONE 20 MG PO TABS
40.0000 mg | ORAL_TABLET | Freq: Every day | ORAL | Status: DC
  Administered 2018-07-13: 40 mg via ORAL
  Filled 2018-07-13: qty 2

## 2018-07-13 MED ORDER — PANTOPRAZOLE SODIUM 40 MG PO TBEC: 40.0000 mg | DELAYED_RELEASE_TABLET | Freq: Every day | ORAL | 0 refills | Status: DC

## 2018-07-13 MED ORDER — ENSURE ENLIVE PO LIQD
237.0000 mL | Freq: Two times a day (BID) | ORAL | Status: DC
  Administered 2018-07-13: 237 mL via ORAL

## 2018-07-13 MED ORDER — POTASSIUM CHLORIDE CRYS ER 20 MEQ PO TBCR
40.0000 meq | EXTENDED_RELEASE_TABLET | Freq: Two times a day (BID) | ORAL | Status: DC
  Administered 2018-07-13: 40 meq via ORAL
  Filled 2018-07-13: qty 2

## 2018-07-13 MED ORDER — PREDNISONE 20 MG PO TABS: 40.0000 mg | ORAL_TABLET | Freq: Every day | ORAL | 0 refills | Status: AC

## 2018-07-13 MED ORDER — ADULT MULTIVITAMIN W/MINERALS CH
1.0000 | ORAL_TABLET | Freq: Every day | ORAL | Status: DC
  Administered 2018-07-13: 1 via ORAL
  Filled 2018-07-13: qty 1

## 2018-07-13 MED ORDER — MAGNESIUM OXIDE 400 (241.3 MG) MG PO TABS
800.0000 mg | ORAL_TABLET | Freq: Once | ORAL | Status: AC
  Administered 2018-07-13: 800 mg via ORAL
  Filled 2018-07-13: qty 2

## 2018-07-13 MED ORDER — POTASSIUM CHLORIDE CRYS ER 20 MEQ PO TBCR: 20.0000 meq | EXTENDED_RELEASE_TABLET | Freq: Every day | ORAL | 0 refills | Status: DC

## 2018-07-13 NOTE — Progress Notes (Signed)
Initial Nutrition Assessment  DOCUMENTATION CODES:   Non-severe (moderate) malnutrition in context of social or environmental circumstances, Underweight  INTERVENTION:  Ensure Enlive BID: to provide 350kcal, 20g protein per serving MVI   NUTRITION DIAGNOSIS:   Moderate Malnutrition related to social / environmental circumstances(inadequate intake, d/t  homeless status of pt) as evidenced by per patient/family report, energy intake < or equal to 75% for > or equal to 1 month, mild muscle depletion, moderate fat depletion.  GOAL:   Patient will meet greater than or equal to 90% of their needs    MONITOR:   PO intake, I & O's, Weight trends, Supplement acceptance  REASON FOR ASSESSMENT:   Malnutrition Screening Tool    ASSESSMENT:  Pt ED to hospital admit on 10/5 for suicidal ideation, cocaine induced mood disorder secondary to suspension of recurrent vasculitis. Pt is homeless and has bilateral BKA and amputation of ring finger. PMH: Severe Maln (2016), Mod Maln (8/19) CHF, Drug abuse  Previous ED to [facility] 9/16  Pt awake at visit and reports feeling good. Reports eating 100% of breakfast (chx sausage, eggs, grits, and orange juice) and looking forward to lunch. Pt stated she rarely eats full meals when not in the hospital d/t being homeless; denied any recent wt loss. Pt tearfully stated she stays too far away from the shelter to receive meals provided and was unsure of food bank locations in the area. Encouraged pt to inquire about meal service providers when speaking w/ the social worker that has been consulted.     Medications: Lasix, Gabapentin, Protonix, Postassium chloride tablet, Prednisone Labs: K 3.1 (L), BUN 36 (H)   NUTRITION - FOCUSED PHYSICAL EXAM:    Most Recent Value  Orbital Region  Moderate depletion  Upper Arm Region  Mild depletion  Thoracic and Lumbar Region  Moderate depletion  Buccal Region  Moderate depletion  Clavicle Bone Region  Mild depletion   Clavicle and Acromion Bone Region  Mild depletion  Scapular Bone Region  Moderate depletion  Dorsal Hand  Mild depletion  Patellar Region  Unable to assess  Anterior Thigh Region  Unable to assess  Posterior Calf Region  Unable to assess  Hair  Reviewed  Eyes  Reviewed  Mouth  Reviewed  Skin  Reviewed  Nails  Reviewed       Diet Order:  100% x 1 recorded meal Diet Order            Diet Heart Room service appropriate? Yes; Fluid consistency: Thin; Fluid restriction: 1200 mL Fluid  Diet effective now        Diet - low sodium heart healthy              EDUCATION NEEDS:   No education needs have been identified at this time  Skin:  Skin Assessment: Reviewed RN Assessment(vasculitis lesions, BL BKA, Left long finger amputation)  Last BM:  10/7  Height:   Ht Readings from Last 1 Encounters:  07/10/18 5' 1"  (1.549 m)    Weight:   Wt Readings from Last 1 Encounters:  07/11/18 37.5 kg    Ideal Body Weight:  (ABW for BL AKA)  BMI:  Body mass index is 15.62 kg/m.  Estimated Nutritional Needs:   Kcal:  [phone]  Protein:  41-56  Fluid:  1275m per MD    SLajuan Lines RD, LDN  After Hours/Weekend Pager: [phone]

## 2018-07-13 NOTE — Care Management Note (Signed)
Case Management Note  Patient Details  Name: [name] MRN: [id number] Date of Birth: 07/01/1961  Subjective/Objective:                  discharged  Action/Plan: appt for the sickle cell clinic on [id number] reviewed with patient   Expected Discharge Date:  07/13/18               Expected Discharge Plan:  Home/Self Care  In-House Referral:  Clinical Social Work  Discharge planning Services  CM Consult, [facility], Follow-up appt scheduled  Post Acute Care Choice:    Choice offered to:     DME Arranged:    DME Agency:     HH Arranged:    [place] Agency:     Status of Service:  Completed, signed off  If discussed at [employer], dates discussed:    Additional Comments:  Leeroy Cha, RN 07/13/2018, 10:27 AM

## 2018-07-13 NOTE — Progress Notes (Signed)
Pt previous assessed 07/10/18 to address disposition plan- see assessment that date. Today, CSW met with pt to encourage accepting resources- pt maintains she will not return to brother's home (has been living with him for several years) and is not interested in pursuing facility living options (discussed ALF or SNF placement process again- pt reiterates she is not willing at this time to consider facility placement). CSW encouraged her to consider shelters and pt states "it's annoying to stay in those because it's so loud. I have my wheelchair and food and I have everything I need I want to just stay outside." CSW processed with her the benefits of utilizing shelter and family assistance (brother reports offering to help her identify/research facilities to move in to). Pt declines. She states she will consider going to a shelter tonight and agreed to "only go to the [facility] this afternoon." CSW provided taxi voucher for safe transportation there.  Sharren Bridge, MSW, LCSW Clinical Social Work 07/13/2018 [phone] coverage for [phone]

## 2018-07-13 NOTE — Discharge Summary (Signed)
Physician Discharge Summary  [name] YQM:[id number] DOB: 1961-06-24 DOA: 07/09/2018  PCP: Azzie Glatter, FNP  Admit date: 07/09/2018 Discharge date: 07/13/2018  Admitted From: Home Disposition: Shelter  Recommendations for Outpatient Follow-up:  1. [facility]-up at [facility] within 7 days, punch biopsy results should be followed up at that time and further management discussion can take place 2. Please obtain BMP/CBC in one week your next doctors visit.  3. Prescribed oral prednisone 40 mg for 7 days.  Discharge Condition: Stable CODE STATUS: Full code Diet recommendation: Cardiac diet  Brief/Interim Summary: 57 year old female with history of cocaine use, chronic grade 2 diastolic congestive heart failure with ejection fraction 60 to 65%, essential hypertension, peripheral vascular disease status post bilateral BKA came to the hospital with complains of multiple rash on his arm.  She stated she started developing rash on her right upper extremity and slowly it spread to her bilateral lower extremity stump.  Her UDS was positive for cocaine.  There was a question that this could be vasculitis perhaps induced by levamisole.  Punch biopsy was performed by general surgery on 10/8 and results were sent to pathology. Patient has reached maximal benefit from an hospital stay and stable to be discharged with outpatient follow-up recommendations as stated above.  Patient is currently homeless therefore arrangements for shelter to be made by the case management and Education officer, museum.  They will also help arranging for outpatient [facility] appointment follow-up within 7 days.  At that time pathology results from punch biopsy can be discussed and further referral can be made if necessary.  Case discussed with general surgery this morning.  Patient has reached maximal benefit from his hospital stay otherwise and stable to be discharged.   Discharge Diagnoses:  Principal  Problem:   Cocaine abuse with cocaine-induced mood disorder ([place]) Active Problems:   Essential hypertension   Cocaine use disorder, severe, dependence (HCC)   S/P bilateral below knee amputation (HCC)   Skin rash  Right upper extremity and bilateral lower extremity stump skin rash concerning for vasculitis -Concerned that this could be induced by levamisole.  Once biopsy performed 10/8, results pending.  This can be followed up outpatient at the community [facility] center.  In the meantime we will discharge patient on 7 days of oral prednisone.  Cocaine use -Advised to quit using any illicit drugs including cocaine.  Peripheral vascular disease requiring bilateral above-the-knee amputations - Supportive care  Chronic congestive diastolic heart failure with ejection fraction 88%, grade 2 diastolic dysfunction -Currently appears to be euvolemic.  She needs to follow-up outpatient with cardiology for routine medical management.  At this time patient states she is now able to afford her routine medications.  Transaminitis, resolved  Patient was on Lovenox while she was here She is a full code Discharge today.  Discharge Instructions  Discharge Instructions    Diet - low sodium heart healthy   Complete by:  As directed    Discharge instructions   Complete by:  As directed    Follow up with resources provided   Increase activity slowly   Complete by:  As directed      Allergies as of 07/13/2018      Reactions   Acetaminophen Swelling, Other (See Comments)   Reaction:  Eyelid swelling   Lisinopril    UNSPECIFIED REACTION       Medication List    TAKE these medications   amLODipine 10 MG tablet Commonly known as:  NORVASC  Take 1 tablet (10 mg total) by mouth daily.   furosemide 40 MG tablet Commonly known as:  LASIX Take 1 tablet (40 mg total) by mouth daily.   losartan 25 MG tablet Commonly known as:  COZAAR Take 1 tablet (25 mg total) by mouth daily.    pantoprazole 40 MG tablet Commonly known as:  PROTONIX Take 1 tablet (40 mg total) by mouth daily. Start taking on:  07/14/2018   potassium chloride SA 20 MEQ tablet Commonly known as:  K-DUR,KLOR-CON Take 1 tablet (20 mEq total) by mouth daily for 2 days.   predniSONE 20 MG tablet Commonly known as:  DELTASONE Take 2 tablets (40 mg total) by mouth daily with breakfast for 7 days. Start taking on:  07/14/2018      Follow-up Information    [place]. Go on 07/20/2018.   Why:  go to the sickle cell clinic on 07/20/2018 for hospital follow up /phone is [phone] if needed Contact information: [place] [address]       [place]              .   Contact information: [address] [address] [place]. Schedule an appointment as soon as possible for a visit in 1 week(s).   Contact information: [place] [address] [phone]         Allergies  Allergen Reactions  . Acetaminophen Swelling and Other (See Comments)    Reaction:  Eyelid swelling  . Lisinopril     UNSPECIFIED REACTION     You were cared for by a hospitalist during your hospital stay. If you have any questions about your discharge medications or the care you received while you were in the hospital after you are discharged, you can call the unit and asked to speak with the hospitalist on call if the hospitalist that took care of you is not available. Once you are discharged, your primary care physician will handle any further medical issues. Please note that no refills for any discharge medications will be authorized once you are discharged, as it is imperative that you return to your primary care physician (or establish a relationship with a primary care physician if you do not have one) for  your aftercare needs so that they can reassess your need for medications and monitor your lab values.  Consultations:  General surgery   Procedures/Studies: Dg Chest 2 View  Result Date: 06/20/2018 CLINICAL DATA:  Cough and dyspnea. Nausea, vomiting and weakness x4 days. EXAM: CHEST - 2 VIEW COMPARISON:  05/19/2018 FINDINGS: There is stable cardiomegaly with aortic atherosclerosis and posterior right moderate pleural effusion. Mild interstitial edema is identified with low lung volumes. No alveolar consolidation. No acute osseous abnormality. IMPRESSION: Stable cardiomegaly with aortic atherosclerosis. Mild interstitial edema and stable posterior right pleural effusion. Electronically Signed   By: Ashley Royalty M.D.   On: 06/20/2018 22:49   Ct Abdomen Pelvis W Contrast  Result Date: 06/21/2018 CLINICAL DATA:  Nausea, vomiting, weakness, and generalized abdominal pain. Rash in the groin for 3 days. EXAM: CT ABDOMEN AND PELVIS WITH CONTRAST TECHNIQUE: Multidetector CT imaging of the abdomen and pelvis was performed using the standard protocol following bolus administration of intravenous contrast. CONTRAST:  134m ISOVUE-300 IOPAMIDOL (ISOVUE-300) INJECTION 61% COMPARISON:  10/12/2010 FINDINGS: Lower chest: Cardiac  enlargement. Small to moderate bilateral pleural effusions with basilar atelectasis. Hepatobiliary: There is prominent reflux of contrast material into the IVC and hepatic veins with dilated patent veins. Heterogeneous parenchymal echotexture. Changes are most consistent with passive hepatic congestion (nutmeg liver). Gallbladder is contracted, likely physiologic. No bile duct dilatation. Pancreas: Unremarkable. No pancreatic ductal dilatation or surrounding inflammatory changes. Spleen: Normal in size without focal abnormality. Adrenals/Urinary Tract: No adrenal gland nodules. Renal nephrograms are symmetrical. Nephrograms are somewhat heterogeneous on the delayed phase which may indicate  pyelonephritis. No focal renal lesions. No hydronephrosis or hydroureter. Bladder is unremarkable. Stomach/Bowel: Stomach, small bowel, and colon are not abnormally distended. There is diffuse wall thickening of the small bowel. This is likely due to hypoproteinemia. Enteritis could also have this appearance. Scattered stool throughout the colon. No colonic wall thickening. Appendix is normal. Vascular/Lymphatic: Aortic atherosclerosis. No enlarged abdominal or pelvic lymph nodes. Reproductive: Uterus and bilateral adnexa are unremarkable. Other: Prominent diffuse abdominal and pelvic ascites. No free air in the abdomen. Abdominal wall musculature appears intact. Diffuse edema throughout the subcutaneous fatty tissues. Musculoskeletal: No acute or significant osseous findings. IMPRESSION: 1. Cardiac enlargement with bilateral pleural effusions and basilar atelectasis. 2. Prominent reflux of contrast material into the IVC and hepatic veins with dilated patent veins suggesting passive hepatic congestion. 3. Diffuse wall thickening of the small bowel probably due to hypoproteinemia. Nonspecific enteritis also possible. No evidence of bowel obstruction. 4. Diffuse abdominal and pelvic ascites. Diffuse edema throughout the subcutaneous fatty tissues. Electronically Signed   By: Lucienne Capers M.D.   On: 06/21/2018 02:18      Subjective: No new complaints, feeling well  General = no fevers, chills, dizziness, malaise, fatigue HEENT/EYES = negative for pain, redness, loss of vision, double vision, blurred vision, loss of hearing, sore throat, hoarseness, dysphagia Cardiovascular= negative for chest pain, palpitation, murmurs, lower extremity swelling Respiratory/lungs= negative for shortness of breath, cough, hemoptysis, wheezing, mucus production Gastrointestinal= negative for nausea, vomiting,, abdominal pain, melena, hematemesis Genitourinary= negative for Dysuria, Hematuria, Change in Urinary  Frequency MSK = Negative for arthralgia, myalgias, Back Pain, Joint swelling  Neurology= Negative for headache, seizures, numbness, tingling  Psychiatry= Negative for anxiety, depression, suicidal and homocidal ideation Allergy/Immunology= Medication/Food allergy as listed  Skin= Negative for Rash, lesions, ulcers, itching   Discharge Exam: Vitals:   07/12/18 2041 07/13/18 0511  BP: (!) 145/85 (!) 154/95  Pulse: 96 91  Resp: 18 18  Temp: 98.3 F (36.8 C) 98.2 F (36.8 C)  SpO2: 99% 100%   Vitals:   07/12/18 0346 07/12/18 1350 07/12/18 2041 07/13/18 0511  BP: (!) 153/97 140/90 (!) 145/85 (!) 154/95  Pulse: 94 94 96 91  Resp: 20 20 18 18   Temp: 97.9 F (36.6 C) 98.1 F (36.7 C) 98.3 F (36.8 C) 98.2 F (36.8 C)  TempSrc: Oral Oral    SpO2: 100% 98% 99% 100%  Weight:      Height:        General: Pt is alert, awake, not in acute distress Cardiovascular: RRR, S1/S2 +, no rubs, no gallops Respiratory: CTA bilaterally, no wheezing, no rhonchi Abdominal: Soft, NT, ND, bowel sounds + Extremities: Bilateral above-the-knee amputations noted Skin- couple small areas of dark skin noted on her right upper extremity and bilateral lower extremity especially the stump area.  No evidence of active infection    The results of significant diagnostics from this hospitalization (including imaging, microbiology, ancillary and laboratory) are listed below for reference.  Microbiology: No results found for this or any previous visit (from the past 240 hour(s)).   Labs: BNP (last 3 results) Recent Labs    05/19/18 2116 06/20/18 2239  BNP 3,603.1* 0,254.2*   Basic Metabolic Panel: Recent Labs  Lab 07/10/18 0029 07/11/18 0518 07/12/18 1243 07/13/18 0535  NA 139 135 138 141  K 4.4 3.5 3.2* 3.1*  CL 103 101 105 107  CO2 18* 20* 23 22  GLUCOSE 67* 131* 140* 97  BUN 23* 25* 25* 36*  CREATININE 0.82 0.75 0.89 0.79  CALCIUM 9.7 9.0 8.7* 9.3   Liver Function Tests: Recent  Labs  Lab 07/10/18 0029 07/11/18 0518 07/12/18 1243 07/13/18 0535  AST 54* 110* 79* 49*  ALT 41 88* 85* 68*  ALKPHOS 102 137* 106 101  BILITOT 2.9* 2.7* 1.7* 1.6*  PROT 7.5 7.5 6.6 6.8  ALBUMIN 4.2 3.8 3.4* 3.5   No results for input(s): LIPASE, AMYLASE in the last 168 hours. No results for input(s): AMMONIA in the last 168 hours. CBC: Recent Labs  Lab 07/10/18 0029 07/11/18 0518  WBC 6.8 6.2  HGB 12.8 11.4*  HCT 40.6 36.4  MCV 85.3 82.9  PLT 260 276   Cardiac Enzymes: No results for input(s): CKTOTAL, CKMB, CKMBINDEX, TROPONINI in the last 168 hours. BNP: Invalid input(s): POCBNP CBG: Recent Labs  Lab 07/10/18 0026  GLUCAP 99   D-Dimer No results for input(s): DDIMER in the last 72 hours. Hgb A1c No results for input(s): HGBA1C in the last 72 hours. Lipid Profile No results for input(s): CHOL, HDL, LDLCALC, TRIG, CHOLHDL, LDLDIRECT in the last 72 hours. Thyroid function studies No results for input(s): TSH, T4TOTAL, T3FREE, THYROIDAB in the last 72 hours.  Invalid input(s): FREET3 Anemia work up No results for input(s): VITAMINB12, FOLATE, FERRITIN, TIBC, IRON, RETICCTPCT in the last 72 hours. Urinalysis    Component Value Date/Time   COLORURINE YELLOW 06/20/2018 2239   APPEARANCEUR CLEAR 06/20/2018 2239   LABSPEC 1.016 06/20/2018 2239   PHURINE 6.0 06/20/2018 2239   GLUCOSEU NEGATIVE 06/20/2018 2239   HGBUR SMALL (A) 06/20/2018 2239   BILIRUBINUR NEGATIVE 06/20/2018 2239   BILIRUBINUR small 04/29/2015 1533   KETONESUR NEGATIVE 06/20/2018 2239   PROTEINUR 30 (A) 06/20/2018 2239   UROBILINOGEN 1.0 07/26/2017 1550   NITRITE NEGATIVE 06/20/2018 2239   LEUKOCYTESUR NEGATIVE 06/20/2018 2239   Sepsis Labs Invalid input(s): PROCALCITONIN,  WBC,  LACTICIDVEN Microbiology No results found for this or any previous visit (from the past 240 hour(s)).   Time coordinating discharge:  I have spent 35 minutes face to face with the patient and on the ward  discussing the patients care, assessment, plan and disposition with other care givers. >50% of the time was devoted counseling the patient about the risks and benefits of treatment/Discharge disposition and coordinating care.   SIGNED:   Damita Lack, MD  [facility] 07/13/2018, 10:21 AM Pager   If 7PM-7AM, please contact night-coverage [email or web address] Password [email or web address]

## 2018-07-13 NOTE — Care Management Note (Signed)
Case Management Note  Patient Details  Name: [name] MRN: [id number] Date of Birth: 04-Mar-1961  Subjective/Objective:                  Discharged to home  Action/Plan: Follow instructions from the csw for homeless issues, has an appointment [phone] with the sickle cell clinic for hospital followup  Expected Discharge Date:  07/10/18               Expected Discharge Plan:  Home/Self Care  In-House Referral:  Clinical Social Work  Discharge planning Services  CM Consult, [facility], Follow-up appt scheduled  Post Acute Care Choice:    Choice offered to:     DME Arranged:    DME Agency:     HH Arranged:    [place] Agency:     Status of Service:  Completed, signed off  If discussed at [employer], dates discussed:    Additional Comments:  Leeroy Cha, RN 07/13/2018, 9:40 AM

## 2018-07-13 NOTE — Progress Notes (Signed)
OT Cancellation Note  Patient Details Name: NYASHIA RANEY MRN: [id number] DOB: [birth year]   Cancelled Treatment:    Reason Eval/Treat Not Completed: Other (comment).  Noted pt is discharging today.  Checked with RN--no immediate OT needs.  Vayla Wilhelmi 07/13/2018, 3:07 PM  Lesle Chris, OTR/L Acute Rehabilitation Services [phone] [facility] pager [phone] office 07/13/2018

## 2018-07-13 NOTE — Progress Notes (Signed)
[name], female   DOB: September 23, 1961, 57 y.o.   MRN: [id number]       Subjective: Sleeping.  No new complaints.  Objective: Vital signs in last 24 hours: Temp:  [98.1 F (36.7 C)-98.3 F (36.8 C)] 98.2 F (36.8 C) (10/09 0511) Pulse Rate:  [91-96] 91 (10/09 0511) Resp:  [18-20] 18 (10/09 0511) BP: (140-154)/(85-95) 154/95 (10/09 0511) SpO2:  [98 %-100 %] 100 % (10/09 0511) Last BM Date: 07/11/18  Intake/Output from previous day: 10/08 0701 - 10/09 0700 In: 810 [P.O.:810] Out: 100 [Urine:100] Intake/Output this shift: No intake/output data recorded.  PE: Ext: left thigh stump punch bx site is healing well with no bleeding.  Lab Results:  Recent Labs    07/11/18 0518  WBC 6.2  HGB 11.4*  HCT 36.4  PLT 276   BMET Recent Labs    07/12/18 1243 07/13/18 0535  NA 138 141  K 3.2* 3.1*  CL 105 107  CO2 23 22  GLUCOSE 140* 97  BUN 25* 36*  CREATININE 0.89 0.79  CALCIUM 8.7* 9.3   PT/INR No results for input(s): LABPROT, INR in the last 72 hours. CMP     Component Value Date/Time   NA 141 07/13/2018 0535   NA 144 11/20/2015 1507   K 3.1 (L) 07/13/2018 0535   CL 107 07/13/2018 0535   CO2 22 07/13/2018 0535   GLUCOSE 97 07/13/2018 0535   BUN 36 (H) 07/13/2018 0535   BUN 11 11/20/2015 1507   CREATININE 0.79 07/13/2018 0535   CREATININE 0.94 04/27/2017 1525   CALCIUM 9.3 07/13/2018 0535   PROT 6.8 07/13/2018 0535   ALBUMIN 3.5 07/13/2018 0535   AST 49 (H) 07/13/2018 0535   ALT 68 (H) 07/13/2018 0535   ALKPHOS 101 07/13/2018 0535   BILITOT 1.6 (H) 07/13/2018 0535   GFRNONAA >60 07/13/2018 0535   GFRNONAA 69 04/27/2017 1525   GFRAA >60 07/13/2018 0535   GFRAA 80 04/27/2017 1525   Lipase     Component Value Date/Time   LIPASE 30 06/20/2018 2239       Studies/Results: No results found.  Anti-infectives: Anti-infectives (From admission, onward)   None       Assessment/Plan ? Vasculitis -s/p punch bx -bandaid over site is  sufficient. -we will sign off     LOS: 2 days    Henreitta Cea , [facility] 07/13/2018, 8:27 AM Pager: [phone]

## 2018-07-16 IMAGING — DX DG CHEST 2V
2 series · 2 of 2 positions shown · non-contrast
Comparison: 03/14/2016 chest radiograph.

CLINICAL DATA: Acute chest pain

EXAM:
CHEST  2 VIEW

[chest lat]
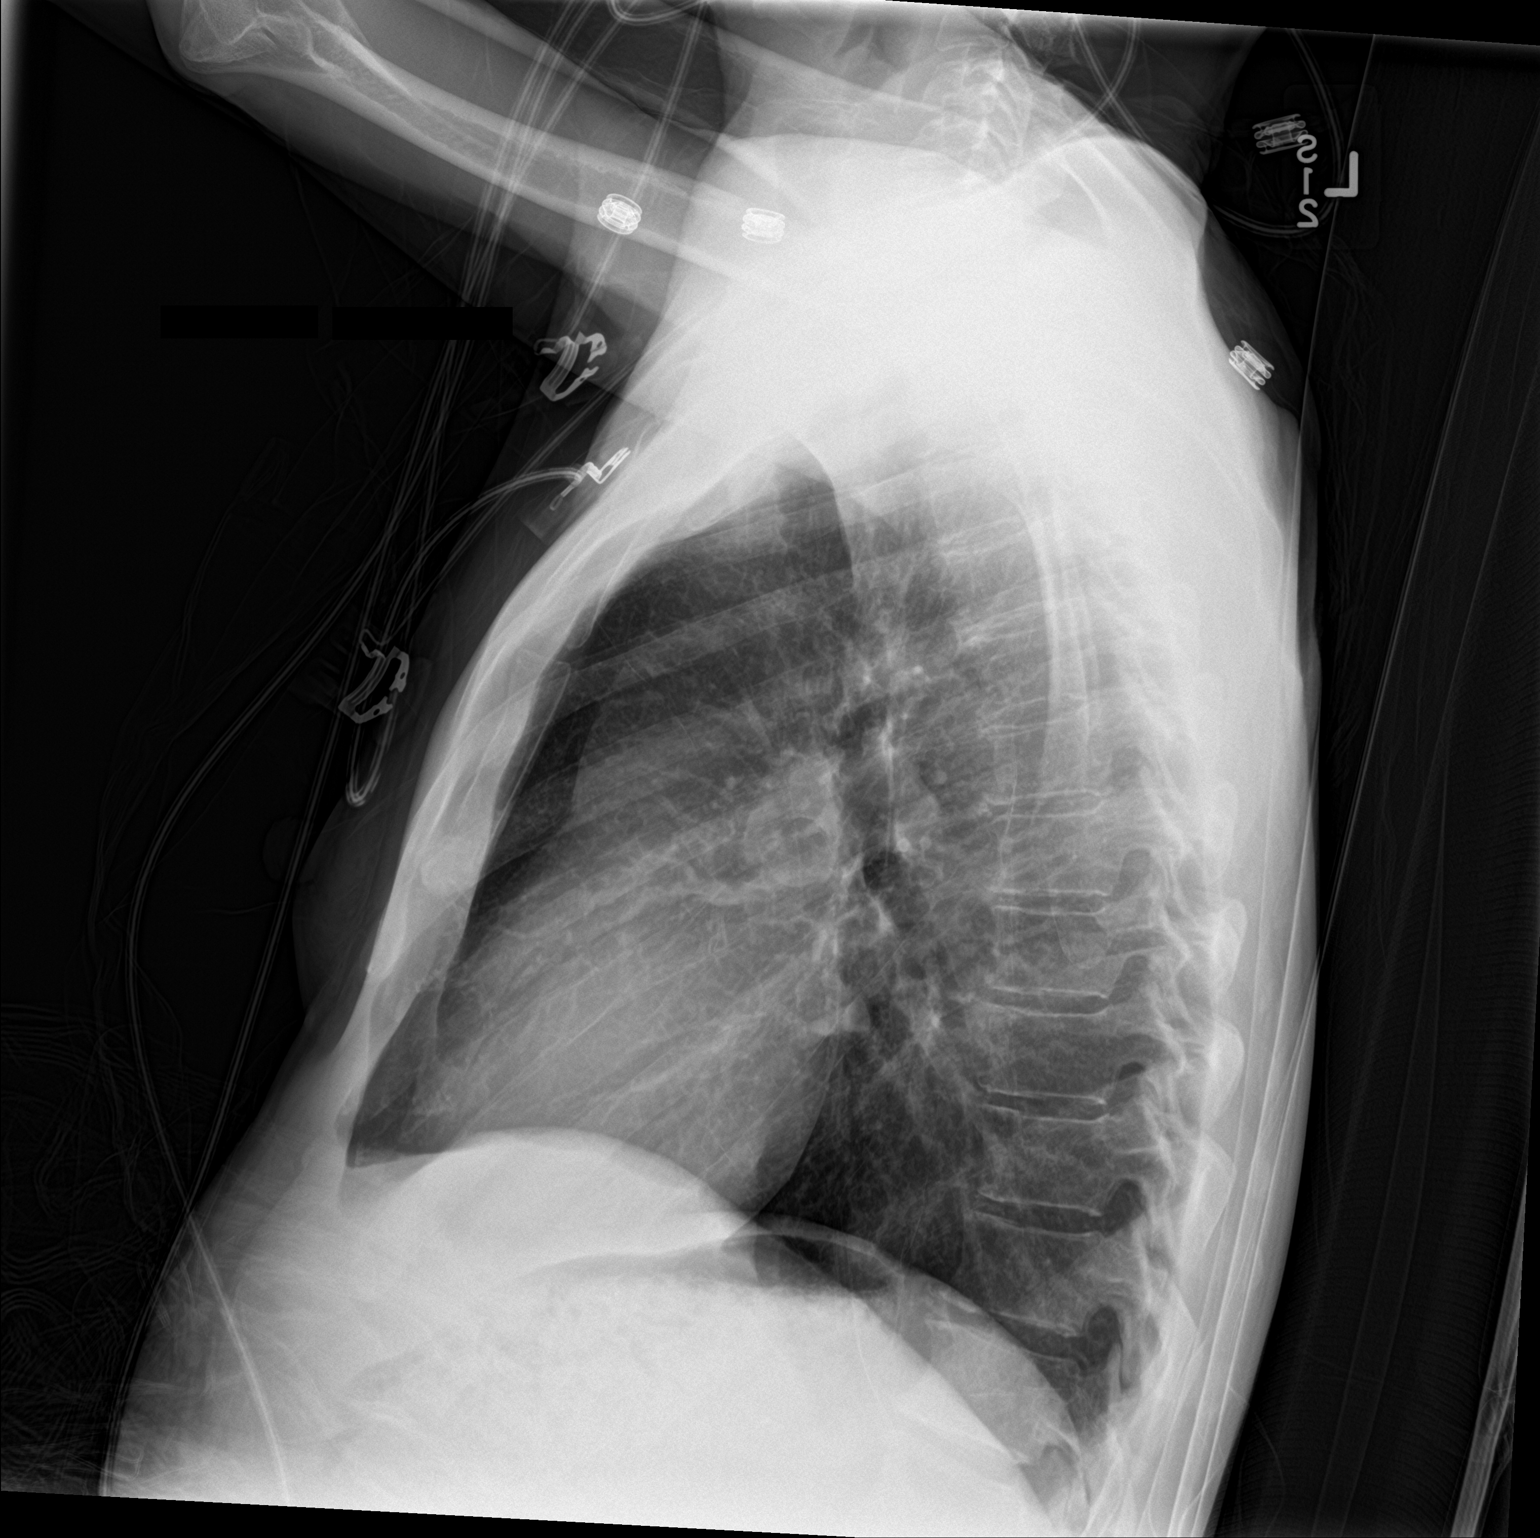

[chest ap]
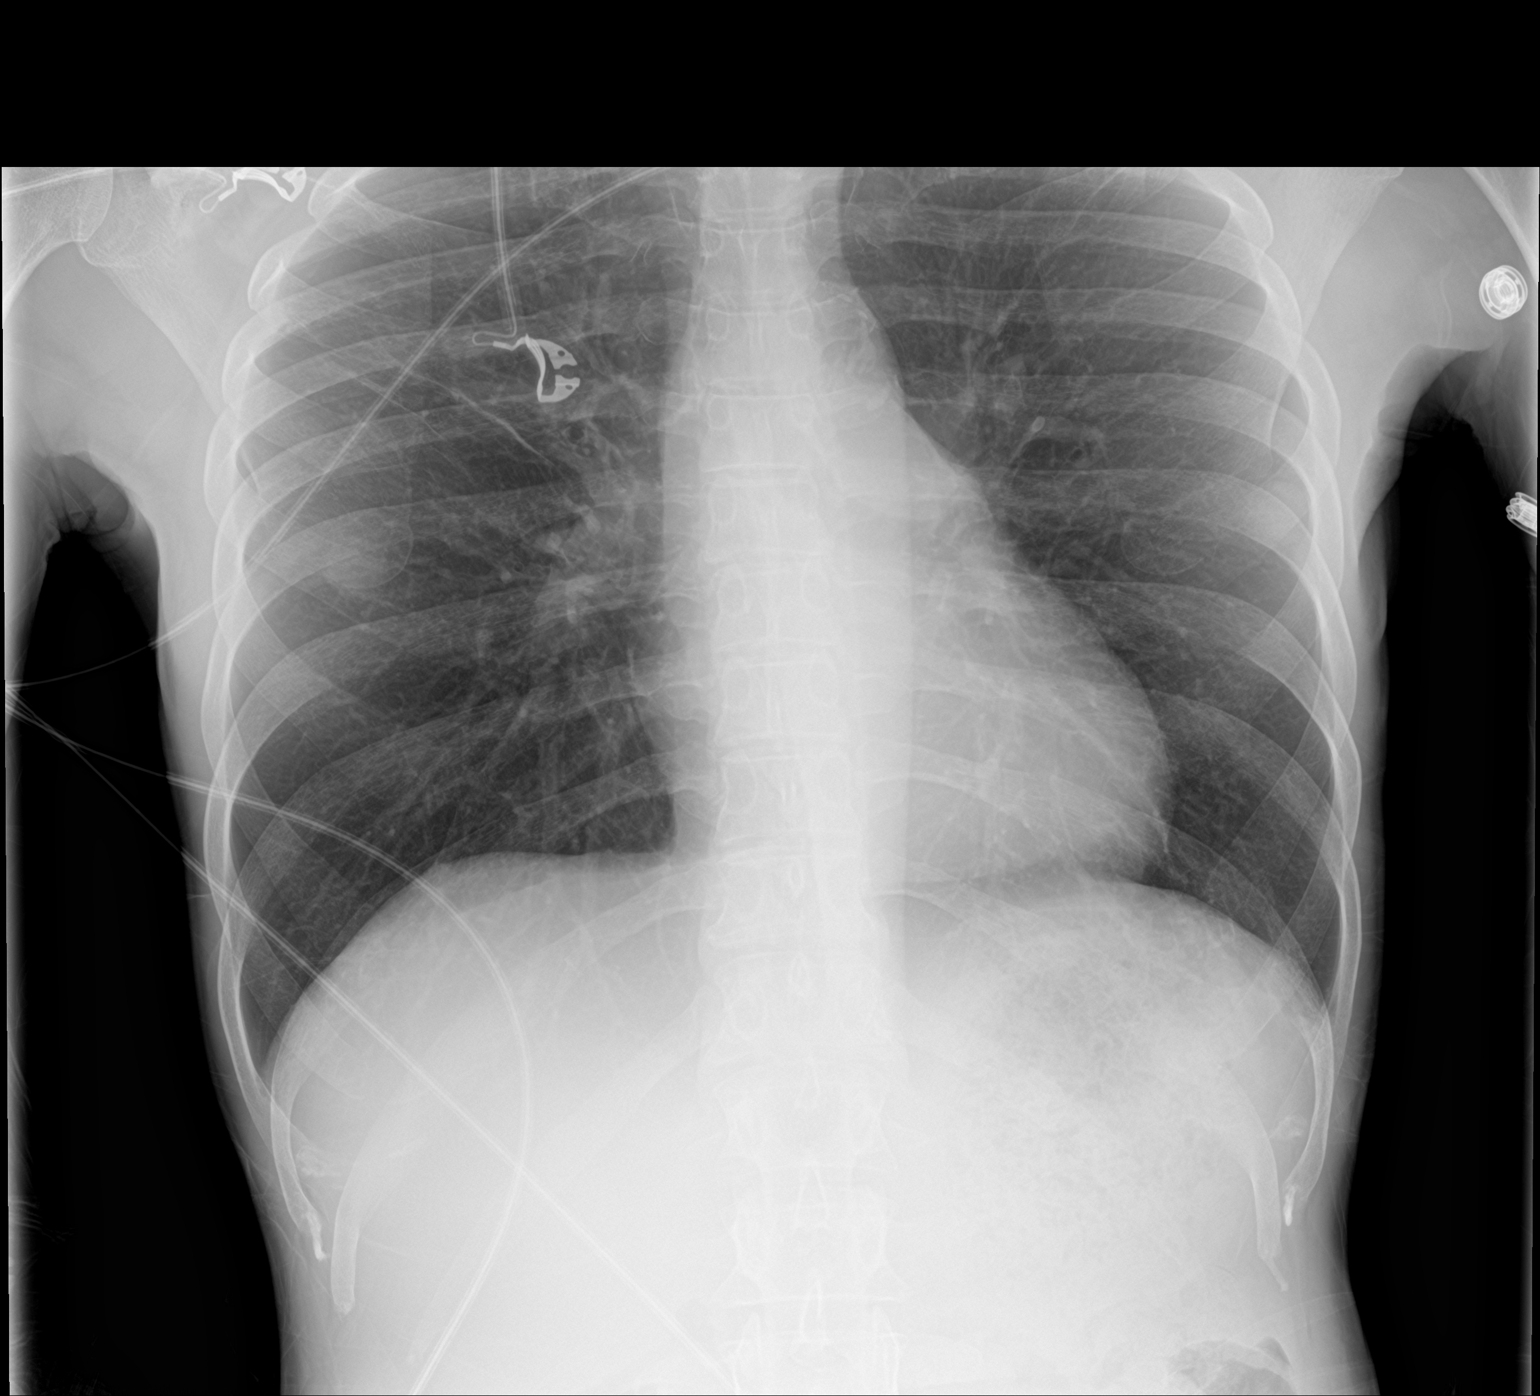

[2 of 2 positions shown; findings below may reference images not displayed]

FINDINGS: Stable cardiomediastinal silhouette with normal heart size and
aortic atherosclerosis.. No pneumothorax. No pleural effusion. Lungs
appear clear, with no acute consolidative airspace disease and no
pulmonary edema.
IMPRESSION: No active cardiopulmonary disease.  Aortic atherosclerosis.

## 2018-07-16 IMAGING — DX DG TIBIA/FIBULA 2V*R*
3 series · 3 of 3 positions shown · non-contrast
Comparison: 06/14/2016 right tibia/fibula radiographs

CLINICAL DATA: Chronic wound.  Acute lower extremity pain.

EXAM:
RIGHT TIBIA AND FIBULA - 2 VIEW

[tibia ap (1 of 2)]
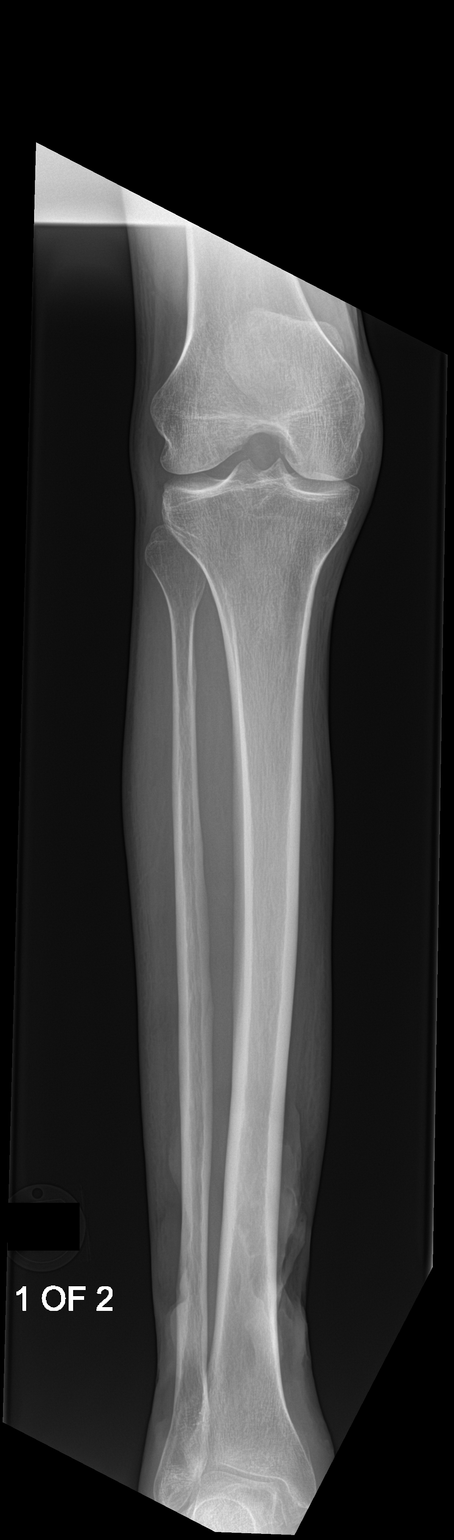

[tibia ap (2 of 2)]
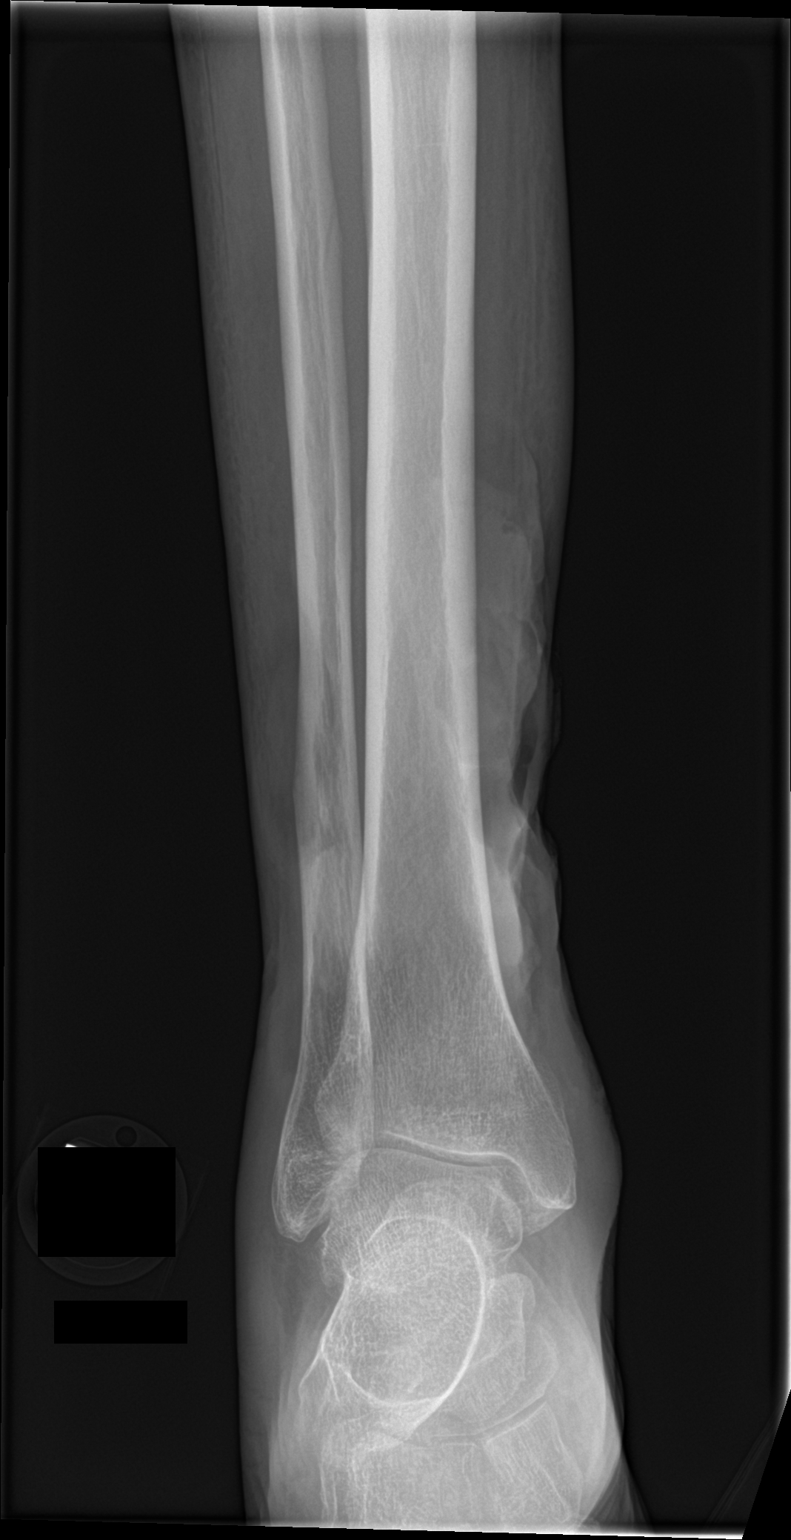

[tibia lat]
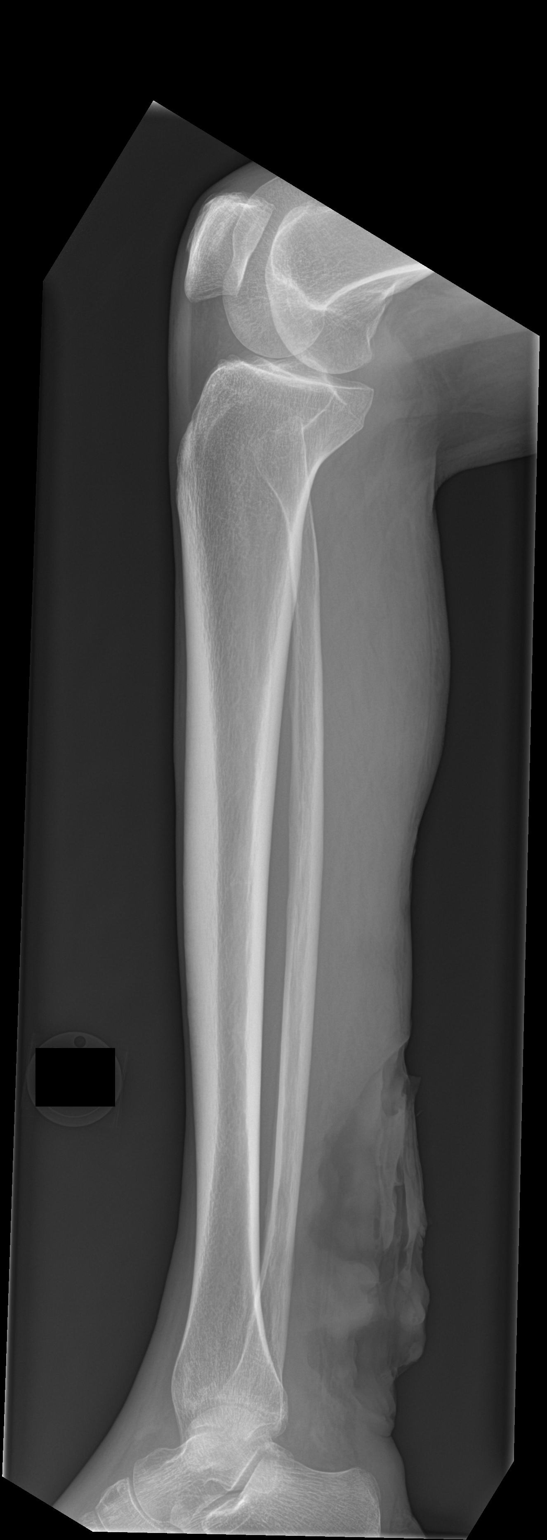

[3 of 3 positions shown; findings below may reference images not displayed]

FINDINGS: Large soft tissue defect is noted posteriorly at the level of the
distal right tibial shaft. No fracture, cortical erosions,
periosteal reaction or suspicious focal osseous lesion on these
views. No evidence of malalignment at the right knee or right ankle
on these views. No radiopaque foreign body.
IMPRESSION: Large soft tissue defect posteriorly at the level of the distal
right tibial shaft. No radiographic findings of osteomyelitis.

## 2018-07-19 ENCOUNTER — Telehealth: Payer: Self-pay

## 2018-07-19 NOTE — Telephone Encounter (Signed)
Called, left message to advised pt of appointment for 07/20/2018. Asked to call and reschedule if unable to make it. Thanks!

## 2018-07-20 ENCOUNTER — Ambulatory Visit: Payer: Self-pay | Admitting: Family Medicine

## 2018-07-24 ENCOUNTER — Emergency Department (HOSPITAL_COMMUNITY): Payer: Medicaid Other

## 2018-07-24 ENCOUNTER — Other Ambulatory Visit: Payer: Self-pay

## 2018-07-24 ENCOUNTER — Emergency Department (HOSPITAL_COMMUNITY)
Admission: EM | Admit: 2018-07-24 | Discharge: 2018-07-24 | Disposition: A | Discharge status: Home / Self Care | Payer: Medicaid Other | Attending: Emergency Medicine | Admitting: Emergency Medicine

## 2018-07-24 ENCOUNTER — Encounter (HOSPITAL_COMMUNITY): Payer: Self-pay | Admitting: Emergency Medicine

## 2018-07-24 DIAGNOSIS — Z9114 Patient's other noncompliance with medication regimen: Secondary | ICD-10-CM | POA: Diagnosis not present

## 2018-07-24 DIAGNOSIS — Z89022 Acquired absence of left finger(s): Secondary | ICD-10-CM | POA: Insufficient documentation

## 2018-07-24 DIAGNOSIS — Z79899 Other long term (current) drug therapy: Secondary | ICD-10-CM | POA: Diagnosis not present

## 2018-07-24 DIAGNOSIS — Z590 Homelessness: Secondary | ICD-10-CM | POA: Diagnosis not present

## 2018-07-24 DIAGNOSIS — I509 Heart failure, unspecified: Secondary | ICD-10-CM | POA: Diagnosis not present

## 2018-07-24 DIAGNOSIS — F1410 Cocaine abuse, uncomplicated: Secondary | ICD-10-CM

## 2018-07-24 DIAGNOSIS — R21 Rash and other nonspecific skin eruption: Secondary | ICD-10-CM | POA: Diagnosis present

## 2018-07-24 DIAGNOSIS — Z89512 Acquired absence of left leg below knee: Secondary | ICD-10-CM | POA: Insufficient documentation

## 2018-07-24 DIAGNOSIS — R079 Chest pain, unspecified: Secondary | ICD-10-CM | POA: Insufficient documentation

## 2018-07-24 DIAGNOSIS — I110 Hypertensive heart disease with heart failure: Secondary | ICD-10-CM | POA: Diagnosis not present

## 2018-07-24 DIAGNOSIS — M79605 Pain in left leg: Secondary | ICD-10-CM | POA: Insufficient documentation

## 2018-07-24 DIAGNOSIS — F419 Anxiety disorder, unspecified: Secondary | ICD-10-CM | POA: Diagnosis not present

## 2018-07-24 DIAGNOSIS — F17210 Nicotine dependence, cigarettes, uncomplicated: Secondary | ICD-10-CM | POA: Diagnosis not present

## 2018-07-24 DIAGNOSIS — M79604 Pain in right leg: Secondary | ICD-10-CM | POA: Diagnosis not present

## 2018-07-24 DIAGNOSIS — Z89511 Acquired absence of right leg below knee: Secondary | ICD-10-CM | POA: Insufficient documentation

## 2018-07-24 LAB — BASIC METABOLIC PANEL
Anion gap: 10 (ref 5–15)
BUN: 22 mg/dL — AB (ref 6–20)
CHLORIDE: 102 mmol/L (ref 98–111)
CO2: 20 mmol/L — ABNORMAL LOW (ref 22–32)
Calcium: 9.2 mg/dL (ref 8.9–10.3)
Creatinine, Ser: 0.79 mg/dL (ref 0.44–1.00)
GFR calc Af Amer: >60 mL/min (ref >60)
GFR calc non Af Amer: >60 mL/min (ref >60)
GLUCOSE: 129 mg/dL — AB (ref 70–99)
POTASSIUM: 3.8 mmol/L (ref 3.5–5.1)
Sodium: 132 mmol/L — ABNORMAL LOW (ref 135–145)

## 2018-07-24 LAB — I-STAT TROPONIN, ED
Troponin i, poc: 0.02 ng/mL (ref 0.00–0.08)
Troponin i, poc: 0.03 ng/mL (ref 0.00–0.08)

## 2018-07-24 LAB — CBC
HEMATOCRIT: 33.8 % — AB (ref 36.0–46.0)
HEMOGLOBIN: 10.7 g/dL — AB (ref 12.0–15.0)
MCH: 26.2 pg (ref 26.0–34.0)
MCHC: 31.7 g/dL (ref 30.0–36.0)
MCV: 82.8 fL (ref 80.0–100.0)
Platelets: 253 10*3/uL (ref 150–400)
RBC: 4.08 MIL/uL (ref 3.87–5.11)
RDW: 19.5 % — ABNORMAL HIGH (ref 11.5–15.5)
WBC: 7.9 10*3/uL (ref 4.0–10.5)
nRBC: 0 % (ref 0.0–0.2)

## 2018-07-24 MED ORDER — LORAZEPAM 2 MG/ML IJ SOLN
0.5000 mg | Freq: Once | INTRAMUSCULAR | Status: AC
  Administered 2018-07-24: 0.5 mg via INTRAVENOUS
  Filled 2018-07-24: qty 1

## 2018-07-24 MED ORDER — FENTANYL CITRATE (PF) 100 MCG/2ML IJ SOLN
50.0000 ug | Freq: Once | INTRAMUSCULAR | Status: AC
  Administered 2018-07-24: 50 ug via INTRAVENOUS
  Filled 2018-07-24: qty 2

## 2018-07-24 MED ORDER — GABAPENTIN 300 MG PO CAPS
300.0000 mg | ORAL_CAPSULE | Freq: Once | ORAL | Status: AC
  Administered 2018-07-24: 300 mg via ORAL
  Filled 2018-07-24: qty 1

## 2018-07-24 MED ORDER — GABAPENTIN 100 MG PO CAPS: 100.0000 mg | ORAL_CAPSULE | Freq: Three times a day (TID) | ORAL | 0 refills | Status: DC

## 2018-07-24 NOTE — ED Notes (Signed)
Patient bathed and purewick attempted to be placed.

## 2018-07-24 NOTE — Progress Notes (Signed)
CSW attempted to speak with pt at bedside however  Pt very sleepy, but woke up to answer questions intermediately.. CSW spoke with pt about living situation and pt expressed sleeping on the streets. CSW asked pt if pt was aware of any shelters and pt expressed yes. Pt asked CSW to provide shelter resources to pt to locate a shelter to go to. CSW advised pt that Monday through Friday [facility] is open between 8am-2pm (pt expressed going to [facility] before) and [facility] being able to assist with pt getting food daily.   At this time there are no further CSW needs. CSW will sign off as list of shelter resources and food resources have been placed at pt's bedside.   Virgie Dad Doniven Vanpatten, MSW, [facility] Clinical Social Worker [phone]

## 2018-07-24 NOTE — ED Triage Notes (Signed)
[place] [facility] [place] [facility]. Non radiating chest pain that has been intermittent since 1900 last night. Vomiting throughout the past 2 weeks. Vomiting PTA.  ST.  324 ASA, no nitro.  98% on RA  BP 172/110.  BKA approximately a month ago on the right

## 2018-07-24 NOTE — ED Notes (Signed)
Patient verbalizes understanding of discharge instructions. Opportunity for questioning and answers were provided. Armband removed by staff, pt discharged from ED.  

## 2018-07-24 NOTE — ED Provider Notes (Signed)
[facility] EMERGENCY DEPARTMENT Provider Note   CSN: [id number] Arrival date & time: 07/24/18  9509     History   Chief Complaint Chief Complaint  Patient presents with  . Chest Pain    HPI [name] is a 57 y.o. female.  The history is provided by the patient. No language interpreter was used.  Chest Pain     [name] is a 57 y.o. female who presents to the Emergency Department complaining of leg pain. She presents to the emergency department for evaluation of burning pain in her legs. She states that she is currently homeless and her brother kicked her out of the house a few weeks ago. She has been sleeping on the streets. She has not been taking any of her medications. She has experienced a burning rash to her underarms and her stumps, greatest over the right stump. She has severe burning pain at the rash site. She did have brief chest pain that is now resolved. She denies any fevers, nausea, vomiting. She does report smoking cigarettes as well as using crack cocaine. Last use was a few days ago. Past Medical History:  Diagnosis Date  . CAP (community acquired pneumonia) 03/2014 X 2  . Cocaine abuse ([place])    ongoing with resultant vaculitis.  . Depression   . Headache    "weekly" (07/29/2016)  . Hypertension   . Inflammatory arthritis   . Migraines    "probably 5-6/yr" (07/29/2016)  . Normocytic anemia    BL Hgb 9.8-12. Last anemia panel 04/2010 - showing Fe 19, ferritin 101.  Pt on monthly B12 injections  . Rheumatoid arthritis(714.0)    patient reported  . 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 Followed by Dr. Louanne Skye    Patient Active Problem List   Diagnosis Date Noted  . Skin rash 07/11/2018  . Acute on chronic diastolic CHF (congestive heart failure) ([place]) 06/21/2018  . Malnutrition of moderate degree 05/20/2018  . Acute congestive heart failure ([place])   . Suicidal ideation  02/02/2018  . HCAP (healthcare-associated pneumonia) 02/02/2018  . Acute on chronic respiratory failure with hypoxia ([place]) 02/02/2018  . Sepsis ([place]) 02/02/2018  . Ulcer of amputation stump of lower extremity ([place]) 10/23/2017  . Cocaine abuse with cocaine-induced mood disorder ([place]) 08/10/2017  . Hypertensive crisis   . Phantom limb pain ([place])   . S/P bilateral below knee amputation ([place]) 04/13/2017  . Tobacco abuse   . Post-operative pain   . Acute blood loss anemia   . Atherosclerosis of native arteries of extremities with gangrene, bilateral legs ([place])   . Wound infection 03/30/2017  . AKI (acute kidney injury) ([place]) 02/07/2017  . Acute kidney injury ([place]) 02/06/2017  . Wound healing, delayed   . Chest pain 04/07/2015  . Protein-calorie malnutrition, severe ([place]) 01/15/2015  . MDD (major depressive disorder), recurrent episode, severe ([place]) 10/16/2014  . Cocaine use disorder, severe, dependence ([place]) 10/10/2014  . 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 Essential hypertension 02/26/2010    Past Surgical History:  Procedure Laterality Date  . AMPUTATION Left 05/22/2016   Procedure: AMPUTATION LEFT LONG FINGER;  Surgeon: Marybelle Killings, MD;  Location: [place] [facility];  Service: Orthopedics;  Laterality: Left;  . AMPUTATION Bilateral 04/10/2017   Procedure: AMPUTATION BELOW KNEE;  Surgeon: Newt Minion, MD;  Location: [facility];  Service: Orthopedics;  Laterality: Bilateral;  . AMPUTATION Bilateral 02/06/2018  Procedure: AMPUTATION ABOVE KNEE;  Surgeon: Newt Minion, MD;  Location: [place];  Service: Orthopedics;  Laterality: Bilateral;  . HERNIA REPAIR     "stomach"  . I&D EXTREMITY Right 09/26/2015   Procedure: IRRIGATION AND DEBRIDEMENT LEG WOUND  VAC PLACEMENT.;  Surgeon: Loel Lofty Dillingham, DO;  Location: [facility];  Service: Plastics;  Laterality: Right;  . INCISION AND DRAINAGE OF WOUND Bilateral 10/20/2016   Procedure: IRRIGATION AND DEBRIDEMENT WOUND BILATERAL;  Surgeon: Edrick Kins, DPM;  Location: [facility];  Service: Podiatry;  Laterality: Bilateral;  . IRRIGATION AND DEBRIDEMENT ABSCESS Bilateral 09/26/2013   Procedure: DEBRIDEMENT ULCERS BILATERAL THIGHS;  Surgeon: Gwenyth Ober, MD;  Location: [facility];  Service: General;  Laterality: Bilateral;  . SKIN BIOPSY Bilateral    shin nodules     OB History   None      Home Medications    Prior to Admission medications   Medication Sig Start Date End Date Taking? Authorizing Provider  amLODipine (NORVASC) 10 MG tablet Take 1 tablet (10 mg total) by mouth daily. 06/23/18   Doreatha Lew, MD  furosemide (LASIX) 40 MG tablet Take 1 tablet (40 mg total) by mouth daily. 06/23/18   Doreatha Lew, MD  losartan (COZAAR) 25 MG tablet Take 1 tablet (25 mg total) by mouth daily. 06/23/18   Doreatha Lew, MD  pantoprazole (PROTONIX) 40 MG tablet Take 1 tablet (40 mg total) by mouth daily. 07/14/18   Amin, Jeanella Flattery, MD  potassium chloride SA (K-DUR,KLOR-CON) 20 MEQ tablet Take 1 tablet (20 mEq total) by mouth daily for 2 days. 07/13/18 07/24/26  Damita Lack, MD    Family History Family History  Problem Relation Age of Onset  . Breast cancer Mother        Breast cancer  . Alcohol abuse Mother   . Colon cancer Maternal Aunt 25  . Alcohol abuse Father     Social History Social History   Tobacco Use  . Smoking status: Current Every Day Smoker    Packs/day: 0.12    Years: 38.00    Pack years: 4.56    Types: Cigarettes  . Smokeless tobacco: Never Used  . Tobacco comment: 2 A DAY  Substance Use Topics  . Alcohol use: No    Alcohol/week: 0.0 standard drinks  . Drug use: Yes    Types: "Crack" cocaine, Cocaine    Comment: Smoked crack 06/02/2017     Allergies   Acetaminophen and Lisinopril   Review of Systems Review of Systems  Cardiovascular: Positive for chest pain.  All other systems reviewed and are negative.    Physical Exam Updated Vital Signs BP (!) 148/83   Pulse (!)  102   Temp 98.8 F (37.1 C) (Oral)   Resp 17   Ht 5' 1.5" (1.562 m)   SpO2 100%   BMI 15.37 kg/m   Physical Exam  Constitutional: She is oriented to person, place, and time. She appears well-developed and well-nourished.  HENT:  Head: Normocephalic and atraumatic.  Cardiovascular: Regular rhythm.  No murmur heard. Tachycardic  Pulmonary/Chest: Effort normal and breath sounds normal. No respiratory distress.  Abdominal: Soft. There is no tenderness. There is no rebound and no guarding.  Musculoskeletal:  Bilateral a.k.a.'s. 2+ femoral pulses bilaterally  Neurological: She is alert and oriented to person, place, and time.  Skin: Skin is warm and dry.  Erythematous and confluentpapular rash, greatest over the right stump. There are no pustular lesions.  There are healing lesions in bilateral axilla.  Psychiatric:  Tearful and anxious  Nursing note and vitals reviewed.    ED Treatments / Results  Labs (all labs ordered are listed, but only abnormal results are displayed) Labs Reviewed  BASIC METABOLIC PANEL - Abnormal; Notable for the following components:      Result Value   Sodium 132 (*)    CO2 20 (*)    Glucose, Bld 129 (*)    BUN 22 (*)    All other components within normal limits  CBC - Abnormal; Notable for the following components:   Hemoglobin 10.7 (*)    HCT 33.8 (*)    RDW 19.5 (*)    All other components within normal limits  I-STAT TROPONIN, ED    EKG EKG Interpretation  Date/Time:  Sunday July 24 2018 03:31:34 EDT Ventricular Rate:  103 PR Interval:  140 QRS Duration: 82 QT Interval:  384 QTC Calculation: 503 R Axis:   90 Text Interpretation:  Sinus tachycardia Biatrial enlargement Rightward axis Left ventricular hypertrophy Abnormal ECG Confirmed by Quintella Reichert [phone]) on 07/24/2018 7:08:56 AM   Radiology Dg Chest Portable 1 View  Result Date: 07/24/2018 CLINICAL DATA:  Chest pain EXAM: PORTABLE CHEST 1 VIEW COMPARISON:  Chest  radiograph 06/20/2018 FINDINGS: Moderate cardiomegaly with calcific aortic atherosclerosis. No pulmonary edema or focal airspace consolidation. No pleural effusion or pneumothorax. IMPRESSION: Unchanged moderate cardiomegaly without pulmonary edema. Electronically Signed   By: Ulyses Jarred M.D.   On: 07/24/2018 03:55    Procedures Procedures (including critical care time)  Medications Ordered in ED Medications  LORazepam (ATIVAN) injection 0.5 mg (has no administration in time range)  gabapentin (NEURONTIN) tablet 300 mg (has no administration in time range)  fentaNYL (SUBLIMAZE) injection 50 mcg (50 mcg Intravenous Given 07/24/18 0558)     Initial Impression / Assessment and Plan / ED Course  I have reviewed the triage vital signs and the nursing notes.  Pertinent labs & imaging results that were available during my care of the patient were reviewed by me and considered in my medical decision making (see chart for details).     She with history of hypertension, cocaine abuse, vasculitis, bilateral a.k.a.'s here for evaluation of burning pain and rash to her legs. Examination is consistent with vasculitis versus dermatitis. Presentation is not consistent with zoster or acute bacterial infection. Presentation is not consistent with ACS, PE, CHF, dissection. She was very anxious and tearful on ED presentation and she was treated with half a milligram of Ativan. On repeat assessment she is calm and appropriate and feeling improved. Given burning component to her pain will treat with Neurontin. Discussed with patient cocaine abuse, recommend discontinuing assistance contributing to her symptoms. Social worker met with the patient to provide resources for shelter. Plan to discharge home with outpatient follow-up and return precautions.  Final Clinical Impressions(s) / ED Diagnoses   Final diagnoses:  None    ED Discharge Orders    None       Quintella Reichert, MD 07/24/18 1529

## 2018-07-27 IMAGING — CR DG TIBIA/FIBULA 2V*R*
2 series · 2 of 2 positions shown · non-contrast
Comparison: Right tibia and fibula radiographs 08/01/2016

CLINICAL DATA: Nonhealing wound in the posterior distal right lower
extremity.

EXAM:
RIGHT TIBIA AND FIBULA - 2 VIEW

[x tib-fib ap right]
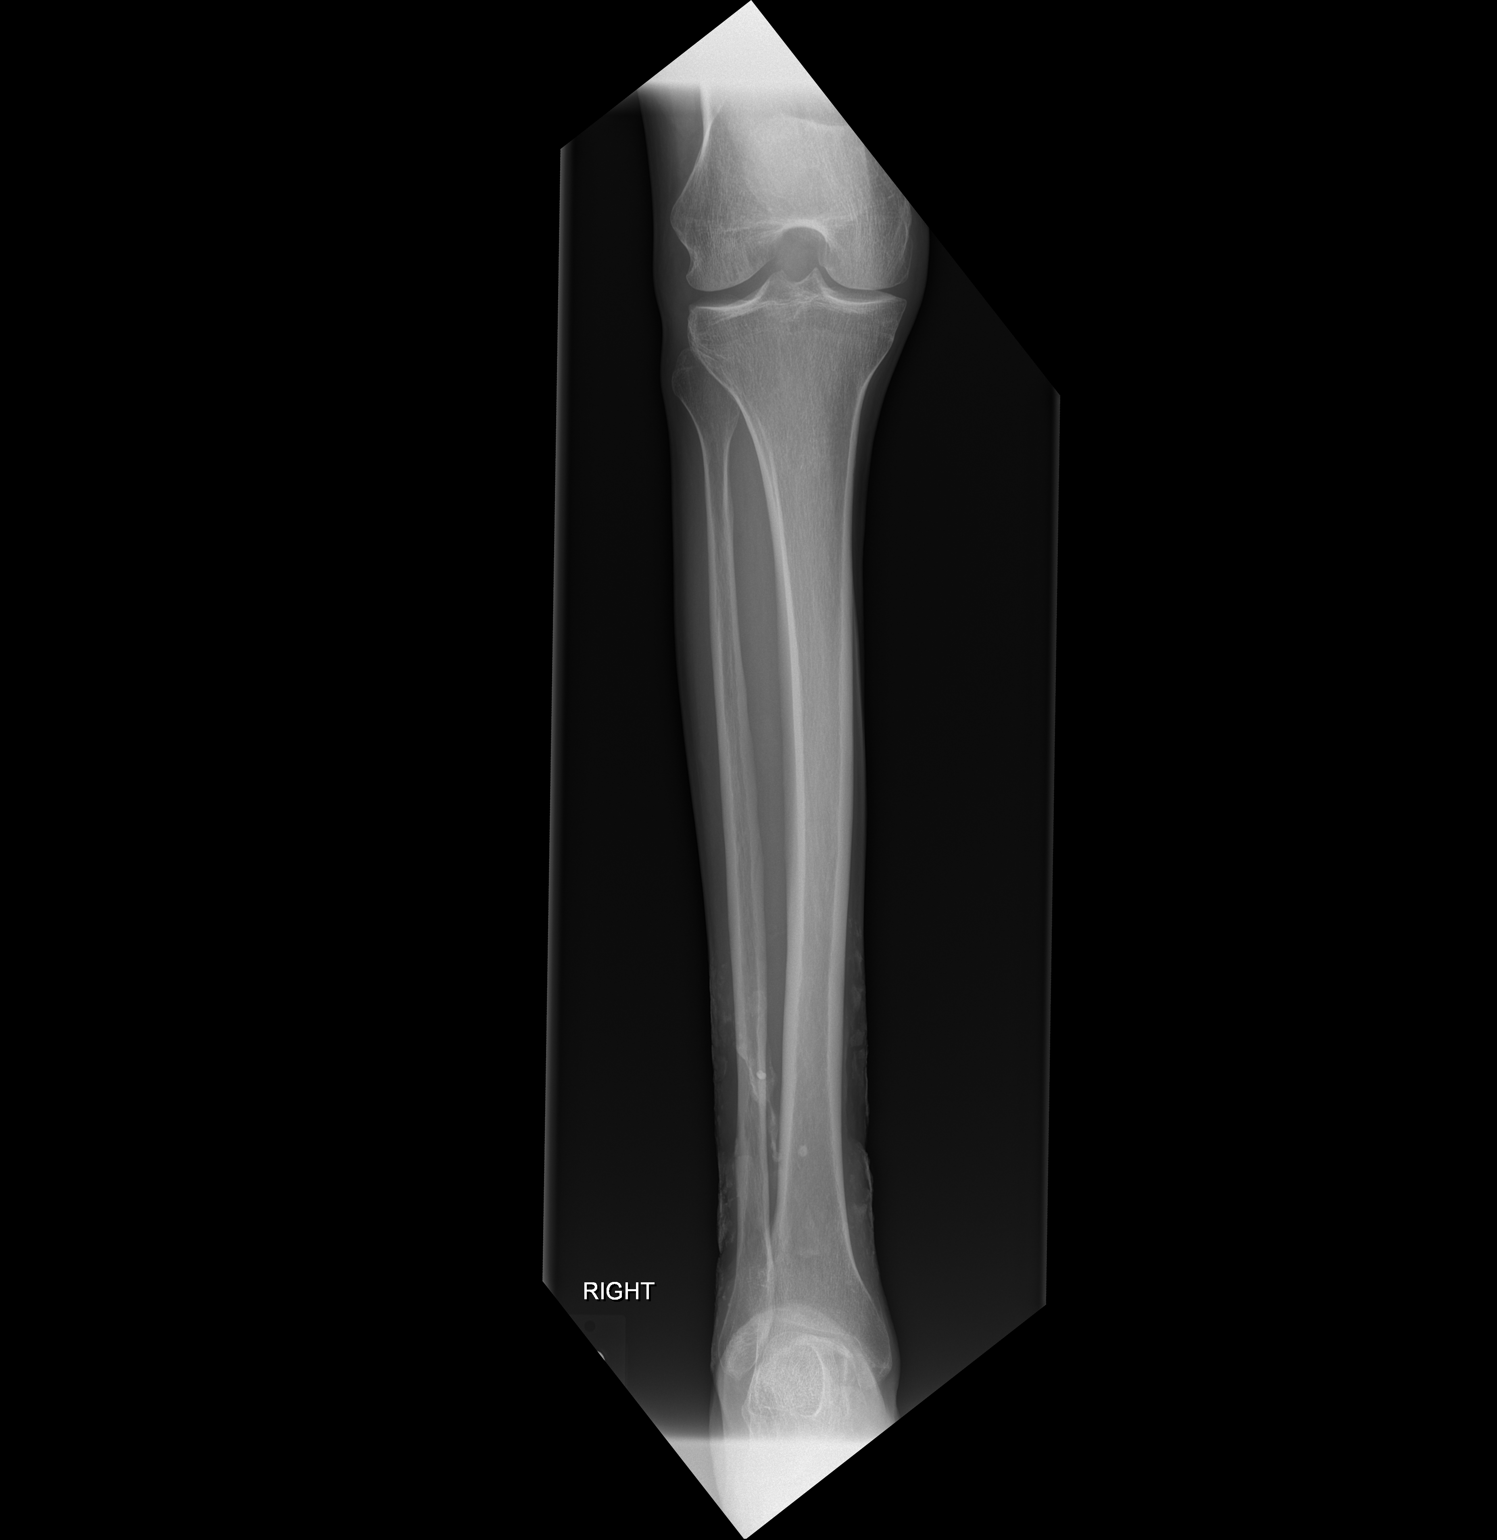

[x tib-fib lat right]
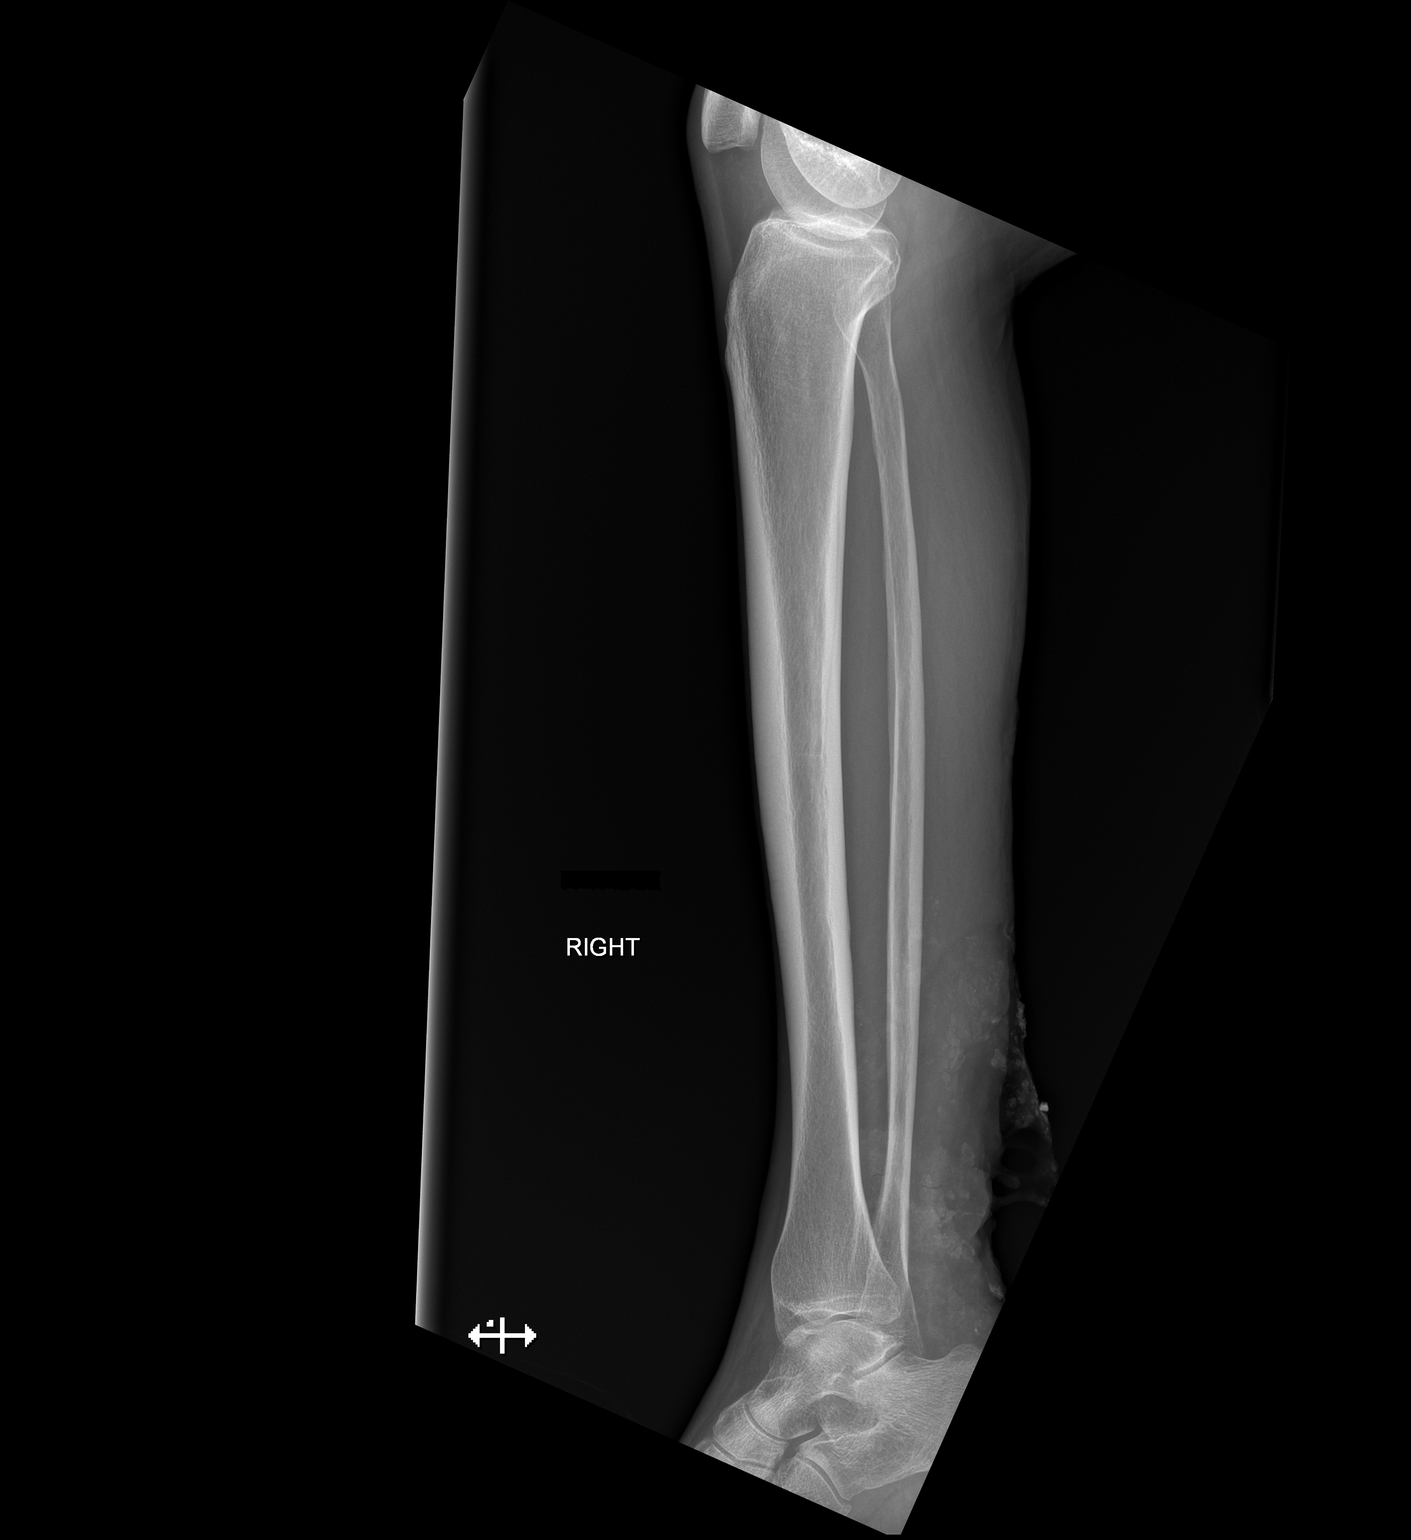

[2 of 2 positions shown; findings below may reference images not displayed]

FINDINGS: A large soft tissue Anselmo Julio is again seen posteriorly at the level of
the distal tibia and fibula. There is no underlying osseous change.
The ankle is located. Radiopaque dressing material is now in place.
The proximal tibia and fibula are unremarkable.
IMPRESSION: 1. Similar appearance of large soft tissue wound posterior to the
distal tibia and fibula without evidence for osteomyelitis.

## 2018-07-27 IMAGING — CR DG ANKLE COMPLETE 3+V*L*
3 series · 3 of 3 positions shown · non-contrast
Comparison: Left ankle radiographs 06/14/2016.

CLINICAL DATA: Chronic soft tissue wound along the medial
malleolus.

EXAM:
LEFT ANKLE COMPLETE - 3+ VIEW

[x ankle ap left]
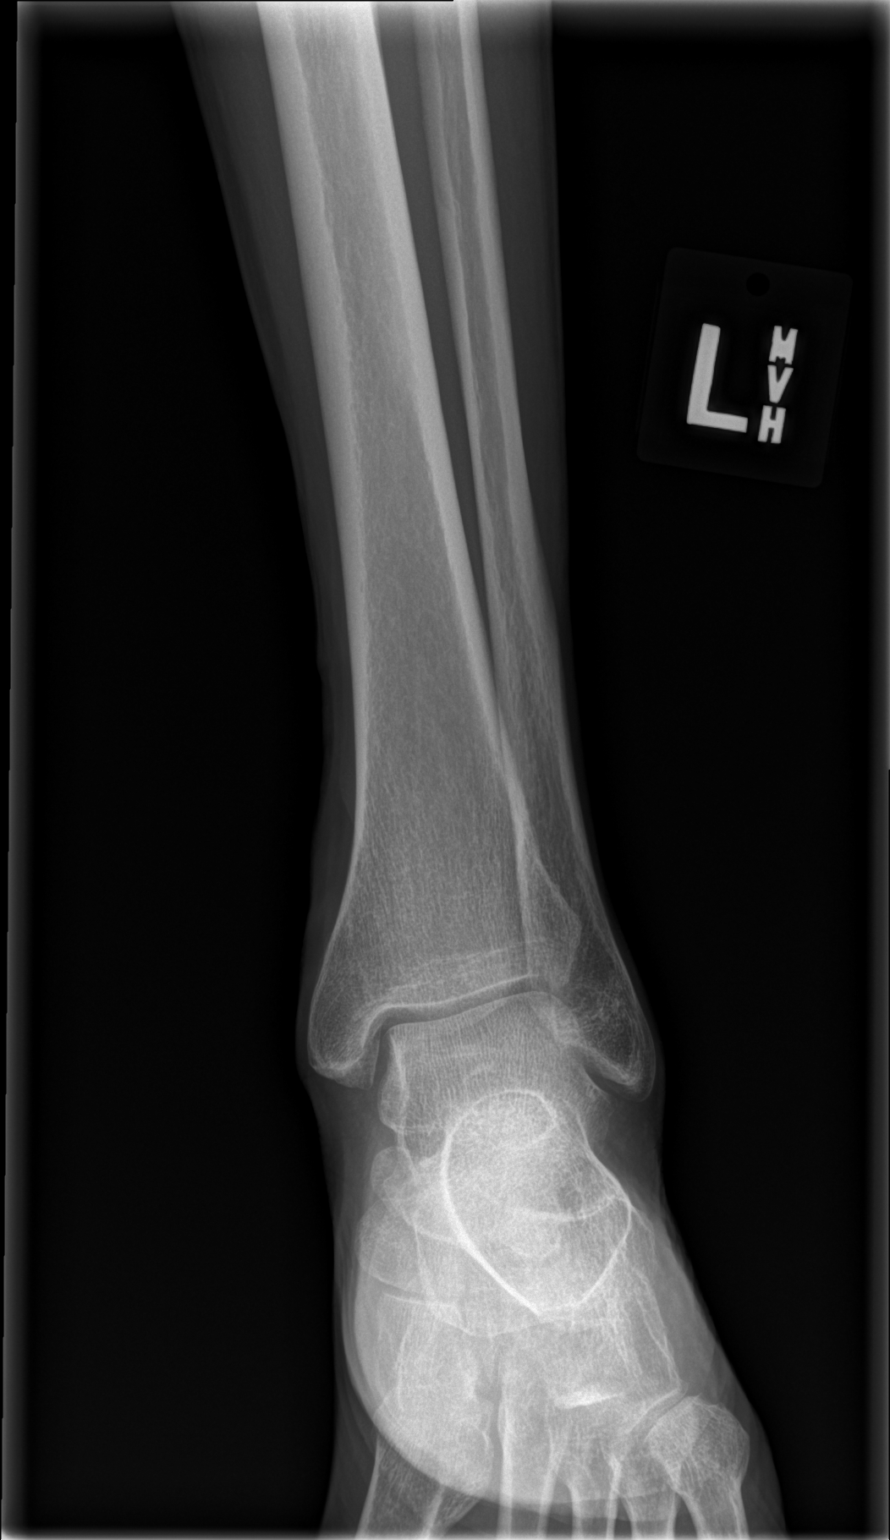

[x ankle obl left]
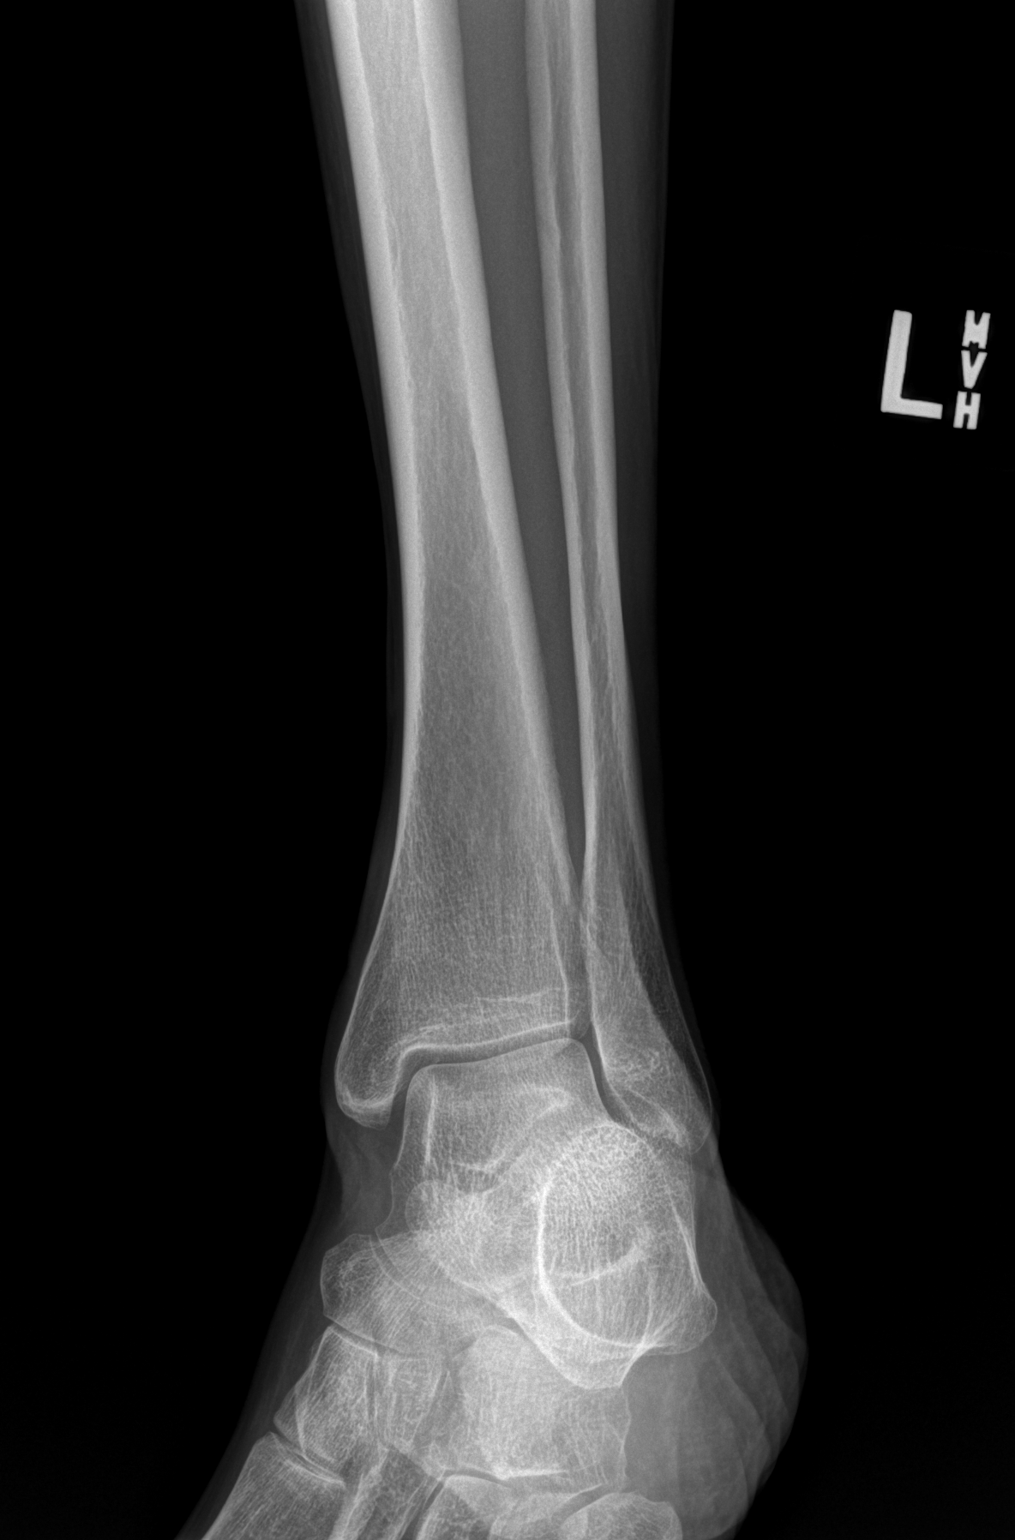

[x ankle lat left]
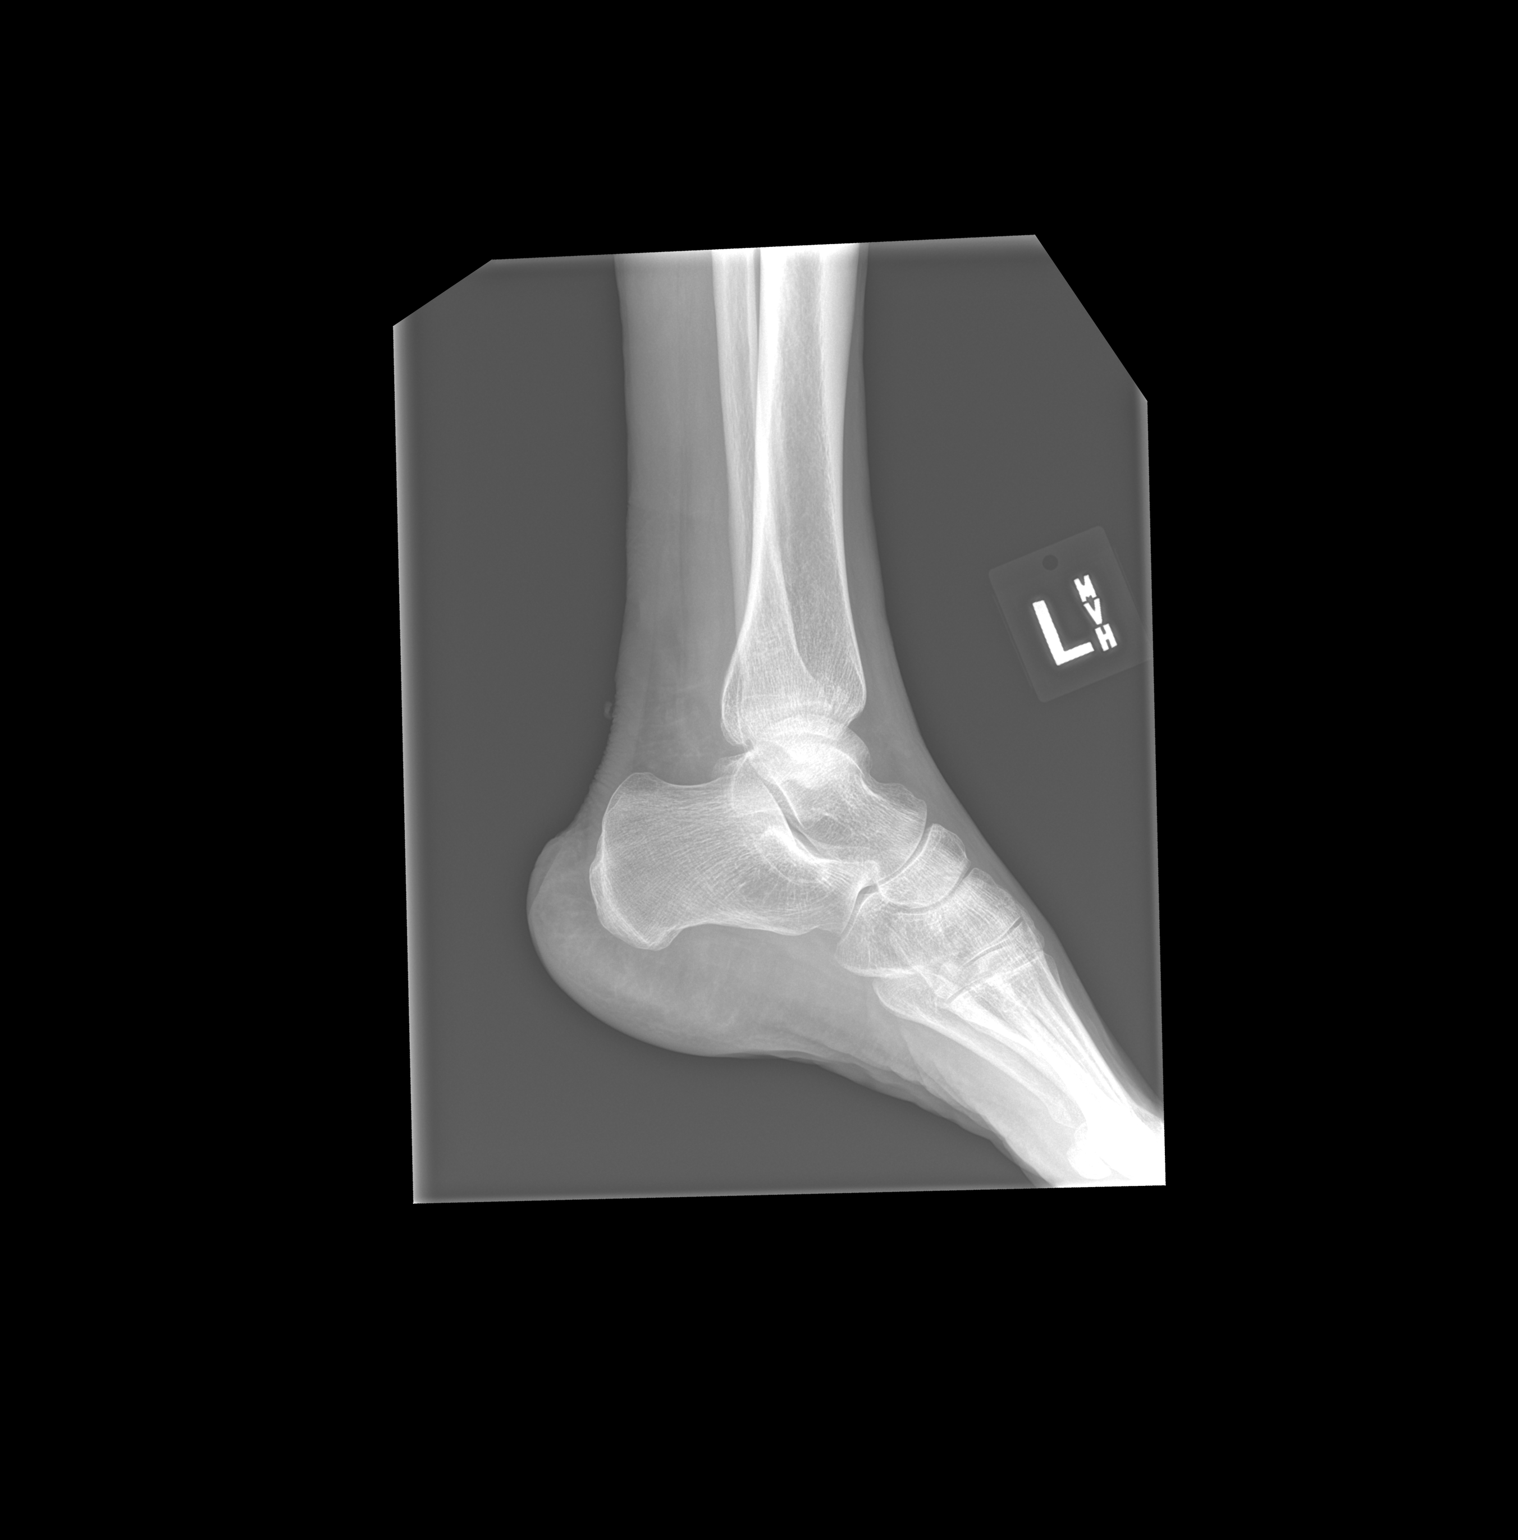

[3 of 3 positions shown; findings below may reference images not displayed]

FINDINGS: A subtle soft tissue ulceration is noted along the medial malleolus.
There is no underlying osseous abnormality. The ankle is located.
The lateral malleolus is within normal limits. There is no
significant joint effusion.
IMPRESSION: Soft tissue swelling and subtle ulceration along the medial
malleolus without radiographic evidence for underlying
osteomyelitis.

## 2018-07-28 ENCOUNTER — Emergency Department (HOSPITAL_COMMUNITY)
Admission: EM | Admit: 2018-07-28 | Discharge: 2018-07-29 | Disposition: A | Discharge status: Home / Self Care | Payer: Medicaid Other | Source: Home / Self Care | Attending: Emergency Medicine | Admitting: Emergency Medicine

## 2018-07-28 ENCOUNTER — Emergency Department (HOSPITAL_COMMUNITY): Payer: Medicaid Other

## 2018-07-28 ENCOUNTER — Other Ambulatory Visit: Payer: Self-pay

## 2018-07-28 DIAGNOSIS — F17210 Nicotine dependence, cigarettes, uncomplicated: Secondary | ICD-10-CM | POA: Insufficient documentation

## 2018-07-28 DIAGNOSIS — Z79899 Other long term (current) drug therapy: Secondary | ICD-10-CM

## 2018-07-28 DIAGNOSIS — R112 Nausea with vomiting, unspecified: Secondary | ICD-10-CM

## 2018-07-28 DIAGNOSIS — R197 Diarrhea, unspecified: Secondary | ICD-10-CM | POA: Insufficient documentation

## 2018-07-28 DIAGNOSIS — I110 Hypertensive heart disease with heart failure: Secondary | ICD-10-CM

## 2018-07-28 DIAGNOSIS — Z590 Homelessness: Secondary | ICD-10-CM | POA: Insufficient documentation

## 2018-07-28 DIAGNOSIS — I5032 Chronic diastolic (congestive) heart failure: Secondary | ICD-10-CM | POA: Insufficient documentation

## 2018-07-28 DIAGNOSIS — R1013 Epigastric pain: Secondary | ICD-10-CM

## 2018-07-28 LAB — COMPREHENSIVE METABOLIC PANEL
ALT: 37 U/L (ref 0–44)
AST: 49 U/L — AB (ref 15–41)
Albumin: 3.8 g/dL (ref 3.5–5.0)
Alkaline Phosphatase: 100 U/L (ref 38–126)
Anion gap: 15 (ref 5–15)
BUN: 32 mg/dL — AB (ref 6–20)
CHLORIDE: 99 mmol/L (ref 98–111)
CO2: 18 mmol/L — AB (ref 22–32)
Calcium: 9.2 mg/dL (ref 8.9–10.3)
Creatinine, Ser: 1.11 mg/dL — ABNORMAL HIGH (ref 0.44–1.00)
GFR calc Af Amer: >60 mL/min (ref >60)
GFR calc non Af Amer: 55 mL/min — ABNORMAL LOW (ref >60)
Glucose, Bld: 89 mg/dL (ref 70–99)
Potassium: 5.1 mmol/L (ref 3.5–5.1)
SODIUM: 132 mmol/L — AB (ref 135–145)
Total Bilirubin: 3.3 mg/dL — ABNORMAL HIGH (ref 0.3–1.2)
Total Protein: 7.3 g/dL (ref 6.5–8.1)

## 2018-07-28 LAB — CBC
HEMATOCRIT: 37.7 % (ref 36.0–46.0)
HEMOGLOBIN: 11.4 g/dL — AB (ref 12.0–15.0)
MCH: 26.4 pg (ref 26.0–34.0)
MCHC: 30.2 g/dL (ref 30.0–36.0)
MCV: 87.3 fL (ref 80.0–100.0)
Platelets: 289 10*3/uL (ref 150–400)
RBC: 4.32 MIL/uL (ref 3.87–5.11)
RDW: 20 % — ABNORMAL HIGH (ref 11.5–15.5)
WBC: 9.3 10*3/uL (ref 4.0–10.5)
nRBC: 0 % (ref 0.0–0.2)

## 2018-07-28 LAB — I-STAT BETA HCG BLOOD, ED (MC, WL, AP ONLY): I-stat hCG, quantitative: <5 m[IU]/mL (ref <5)

## 2018-07-28 LAB — LIPASE, BLOOD: Lipase: 21 U/L (ref 11–51)

## 2018-07-28 MED ORDER — SODIUM CHLORIDE 0.9 % IV BOLUS
1000.0000 mL | Freq: Once | INTRAVENOUS | Status: AC
  Administered 2018-07-28: 1000 mL via INTRAVENOUS

## 2018-07-28 MED ORDER — MORPHINE SULFATE (PF) 4 MG/ML IV SOLN
4.0000 mg | Freq: Once | INTRAVENOUS | Status: AC
  Administered 2018-07-28: 4 mg via INTRAVENOUS
  Filled 2018-07-28: qty 1

## 2018-07-28 MED ORDER — ONDANSETRON HCL 4 MG/2ML IJ SOLN
4.0000 mg | Freq: Once | INTRAMUSCULAR | Status: AC
  Administered 2018-07-28: 4 mg via INTRAVENOUS
  Filled 2018-07-28: qty 2

## 2018-07-28 MED ORDER — ONDANSETRON HCL 4 MG/2ML IJ SOLN
4.0000 mg | Freq: Once | INTRAMUSCULAR | Status: AC | PRN
  Administered 2018-07-28: 4 mg via INTRAVENOUS
  Filled 2018-07-28: qty 2

## 2018-07-28 MED ORDER — IOPAMIDOL (ISOVUE-300) INJECTION 61%
INTRAVENOUS | Status: AC
  Filled 2018-07-28: qty 100

## 2018-07-28 MED ORDER — IOPAMIDOL (ISOVUE-300) INJECTION 61%
100.0000 mL | Freq: Once | INTRAVENOUS | Status: AC | PRN
  Administered 2018-07-29: 80 mL via INTRAVENOUS

## 2018-07-28 NOTE — ED Notes (Signed)
Pt is hard stick, this nurse attempted 2x with no success, [facility] tried 5x as well with no sucess

## 2018-07-28 NOTE — ED Triage Notes (Signed)
Pt to ED with c/o N/V/D since 4pm today and abdominal pain. Pain is epigastric and radiates outward 10/10. Pt states no change in diet, but is currently homeless and gets drinks and ice from gas station she stays at. Pt states she just doesn't feel good and is very cold. Pt is A & O x4. Pt has had bilateral AKA amputations and left leg still has bandages. Right leg near amputation appears to be separating and pt states wounds on bottom.

## 2018-07-29 LAB — URINALYSIS, ROUTINE W REFLEX MICROSCOPIC
Bilirubin Urine: NEGATIVE
Glucose, UA: NEGATIVE mg/dL
Ketones, ur: NEGATIVE mg/dL
Leukocytes, UA: NEGATIVE
Nitrite: NEGATIVE
PROTEIN: 30 mg/dL — AB
SPECIFIC GRAVITY, URINE: 1.016 (ref 1.005–1.030)
pH: 5 (ref 5.0–8.0)

## 2018-07-29 MED ORDER — ONDANSETRON 4 MG PO TBDP: 4.0000 mg | ORAL_TABLET | Freq: Three times a day (TID) | ORAL | 0 refills | Status: DC | PRN

## 2018-07-29 MED ORDER — DIPHENHYDRAMINE HCL 50 MG/ML IJ SOLN
25.0000 mg | Freq: Once | INTRAMUSCULAR | Status: AC
  Administered 2018-07-29: 25 mg via INTRAVENOUS
  Filled 2018-07-29: qty 1

## 2018-07-29 NOTE — Progress Notes (Signed)
CSW spoke with MD at [facility] ED regarding pt. CSW was asked to provide pt with resources for living arrangements. CSW spoke with pt in the past regarding available resources and provided resources to pt at that time. CSW has faxed over more resources to [facility] ED for pt at this time.    There are no further CSW needs. CSW signing off.    [name], MSW, [place] [facility] Emergency Department Clinical Social Worker [phone]

## 2018-07-29 NOTE — ED Provider Notes (Signed)
[facility] DEPT Provider Note   CSN: [id number] Arrival date & time: 07/28/18  2107     History   Chief Complaint Chief Complaint  Patient presents with  . Emesis  . Nausea  . Diarrhea  . Abdominal Pain    HPI [name] is a 57 y.o. female.  Patient presents to the emergency department with a chief complaint of nausea, vomiting, diarrhea.  Onset of symptoms was 4 PM yesterday.  She reports some epigastric pain.  She denies any other associated symptoms.  She states that she is homeless and has nowhere to stay.  She has not taken anything for her symptoms.  There are no aggravating or alleviating factors.    The history is provided by the patient. No language interpreter was used.    Past Medical History:  Diagnosis Date  . CAP (community acquired pneumonia) 03/2014 X 2  . Cocaine abuse ([place])    ongoing with resultant vaculitis.  . Depression   . Headache    "weekly" (07/29/2016)  . Hypertension   . Inflammatory arthritis   . Migraines    "probably 5-6/yr" (07/29/2016)  . Normocytic anemia    BL Hgb 9.8-12. Last anemia panel 04/2010 - showing Fe 19, ferritin 101.  Pt on monthly B12 injections  . Rheumatoid arthritis(714.0)    patient reported  . 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 Followed by Dr. Louanne Skye    Patient Active Problem List   Diagnosis Date Noted  . Skin rash 07/11/2018  . Acute on chronic diastolic CHF (congestive heart failure) ([place]) 06/21/2018  . Malnutrition of moderate degree 05/20/2018  . Acute congestive heart failure ([place])   . Suicidal ideation 02/02/2018  . HCAP (healthcare-associated pneumonia) 02/02/2018  . Acute on chronic respiratory failure with hypoxia ([place]) 02/02/2018  . Sepsis ([place]) 02/02/2018  . Ulcer of amputation stump of lower extremity ([place]) 10/23/2017  . Cocaine abuse with cocaine-induced mood disorder ([place]) 08/10/2017  . Hypertensive  crisis   . Phantom limb pain ([place])   . S/P bilateral below knee amputation ([place]) 04/13/2017  . Tobacco abuse   . Post-operative pain   . Acute blood loss anemia   . Atherosclerosis of native arteries of extremities with gangrene, bilateral legs ([place])   . Wound infection 03/30/2017  . AKI (acute kidney injury) ([place]) 02/07/2017  . Acute kidney injury ([place]) 02/06/2017  . Wound healing, delayed   . Chest pain 04/07/2015  . Protein-calorie malnutrition, severe ([place]) 01/15/2015  . MDD (major depressive disorder), recurrent episode, severe ([place]) 10/16/2014  . Cocaine use disorder, severe, dependence ([place]) 10/10/2014  . 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 Essential hypertension 02/26/2010    Past Surgical History:  Procedure Laterality Date  . AMPUTATION Left 05/22/2016   Procedure: AMPUTATION LEFT LONG FINGER;  Surgeon: Marybelle Killings, MD;  Location: [facility];  Service: Orthopedics;  Laterality: Left;  . AMPUTATION Bilateral 04/10/2017   Procedure: AMPUTATION BELOW KNEE;  Surgeon: Newt Minion, MD;  Location: [facility];  Service: Orthopedics;  Laterality: Bilateral;  . AMPUTATION Bilateral 02/06/2018   Procedure: AMPUTATION ABOVE KNEE;  Surgeon: Newt Minion, MD;  Location: [facility];  Service: Orthopedics;  Laterality: Bilateral;  . HERNIA REPAIR     "stomach"  . I&D EXTREMITY Right 09/26/2015   Procedure: IRRIGATION AND DEBRIDEMENT LEG WOUND  VAC PLACEMENT.;  Surgeon: Loel Lofty Dillingham, DO;  Location: [facility];  Service: Clinical cytogeneticist;  Laterality: Right;  . INCISION AND DRAINAGE OF WOUND Bilateral 10/20/2016   Procedure: IRRIGATION AND DEBRIDEMENT WOUND BILATERAL;  Surgeon: Edrick Kins, DPM;  Location: [facility];  Service: Podiatry;  Laterality: Bilateral;  . IRRIGATION AND DEBRIDEMENT ABSCESS Bilateral 09/26/2013   Procedure: DEBRIDEMENT ULCERS BILATERAL THIGHS;  Surgeon: Gwenyth Ober, MD;  Location: [facility];  Service: General;  Laterality: Bilateral;  . SKIN BIOPSY Bilateral    shin  nodules     OB History   None      Home Medications    Prior to Admission medications   Medication Sig Start Date End Date Taking? Authorizing Provider  amLODipine (NORVASC) 10 MG tablet Take 1 tablet (10 mg total) by mouth daily. 06/23/18   Doreatha Lew, MD  furosemide (LASIX) 40 MG tablet Take 1 tablet (40 mg total) by mouth daily. 06/23/18   Doreatha Lew, MD  gabapentin (NEURONTIN) 100 MG capsule Take 1 capsule (100 mg total) by mouth 3 (three) times daily. 07/24/18   Quintella Reichert, MD  losartan (COZAAR) 25 MG tablet Take 1 tablet (25 mg total) by mouth daily. 06/23/18   Doreatha Lew, MD  ondansetron (ZOFRAN ODT) 4 MG disintegrating tablet Take 1 tablet (4 mg total) by mouth every 8 (eight) hours as needed for nausea or vomiting. 07/29/18   Montine Circle, PA-C  pantoprazole (PROTONIX) 40 MG tablet Take 1 tablet (40 mg total) by mouth daily. 07/14/18   Amin, Jeanella Flattery, MD  potassium chloride SA (K-DUR,KLOR-CON) 20 MEQ tablet Take 1 tablet (20 mEq total) by mouth daily for 2 days. 07/13/18 07/24/26  Damita Lack, MD    Family History Family History  Problem Relation Age of Onset  . Breast cancer Mother        Breast cancer  . Alcohol abuse Mother   . Colon cancer Maternal Aunt 58  . Alcohol abuse Father     Social History Social History   Tobacco Use  . Smoking status: Current Every Day Smoker    Packs/day: 0.12    Years: 38.00    Pack years: 4.56    Types: Cigarettes  . Smokeless tobacco: Never Used  . Tobacco comment: 2 A DAY  Substance Use Topics  . Alcohol use: No    Alcohol/week: 0.0 standard drinks  . Drug use: Yes    Types: "Crack" cocaine, Cocaine    Comment: Smoked crack 06/02/2017     Allergies   Acetaminophen and Lisinopril   Review of Systems Review of Systems  All other systems reviewed and are negative.    Physical Exam Updated Vital Signs BP (!) 168/117   Pulse 93   Temp 98.3 F (36.8 C) (Oral)   Resp  18   Ht 5' 1"  (1.549 m)   Wt 46.7 kg   SpO2 99%   BMI 19.46 kg/m   Physical Exam  Constitutional: She is oriented to person, place, and time. She appears well-developed and well-nourished.  HENT:  Head: Normocephalic and atraumatic.  Eyes: Pupils are equal, round, and reactive to light. Conjunctivae and EOM are normal.  Neck: Normal range of motion. Neck supple.  Cardiovascular: Normal rate and regular rhythm. Exam reveals no gallop and no friction rub.  No murmur heard. Pulmonary/Chest: Effort normal and breath sounds normal. No respiratory distress. She has no wheezes. She has no rales. She exhibits no tenderness.  Abdominal: Soft. Bowel sounds are normal. She exhibits no distension and no mass. There is  no tenderness. There is no rebound and no guarding.  Generalized abdominal discomfort, but no focal tenderness on exam  Musculoskeletal: Normal range of motion. She exhibits no edema or tenderness.  Bilateral AKA's  Neurological: She is alert and oriented to person, place, and time.  Skin: Skin is warm and dry.  Excoriations on lower extremities, no evidence of cellulitis  Psychiatric: She has a normal mood and affect. Her behavior is normal. Judgment and thought content normal.  Nursing note and vitals reviewed.    ED Treatments / Results  Labs (all labs ordered are listed, but only abnormal results are displayed) Labs Reviewed  CBC - Abnormal; Notable for the following components:      Result Value   Hemoglobin 11.4 (*)    RDW 20.0 (*)    All other components within normal limits  URINALYSIS, ROUTINE W REFLEX MICROSCOPIC - Abnormal; Notable for the following components:   Color, Urine AMBER (*)    APPearance HAZY (*)    Hgb urine dipstick MODERATE (*)    Protein, ur 30 (*)    Bacteria, UA RARE (*)    All other components within normal limits  COMPREHENSIVE METABOLIC PANEL - Abnormal; Notable for the following components:   Sodium 132 (*)    CO2 18 (*)    BUN 32 (*)     Creatinine, Ser 1.11 (*)    AST 49 (*)    Total Bilirubin 3.3 (*)    GFR calc non Af Amer 55 (*)    All other components within normal limits  LIPASE, BLOOD  I-STAT BETA HCG BLOOD, ED ([facility], [facility], [facility] ONLY)    EKG None  Radiology Ct Abdomen Pelvis W Contrast  Result Date: 07/29/2018 CLINICAL DATA:  Abdominal pain, nausea, vomiting and diarrhea beginning this afternoon. History of substance abuse, hernia repair. EXAM: CT ABDOMEN AND PELVIS WITH CONTRAST TECHNIQUE: Multidetector CT imaging of the abdomen and pelvis was performed using the standard protocol following bolus administration of intravenous contrast. CONTRAST:  63m ISOVUE-300 IOPAMIDOL (ISOVUE-300) INJECTION 61% COMPARISON:  CT abdomen and pelvis June 21, 2018 FINDINGS: LOWER CHEST: Lung bases are clear. Stable cardiomegaly, reflux contrast consistent with RIGHT heart failure. No pericardial effusion. HEPATOBILIARY: Heterogeneous liver with enlarged IVC with contrast reflux consistent with passive hepatic congestion. Mild hepatomegaly. Normal gallbladder. PANCREAS: Too small to characterize hypodensity in spleen. SPLEEN: Normal. ADRENALS/URINARY TRACT: Kidneys are orthotopic, demonstrating symmetric enhancement. Multifocal scarring bilateral kidneys. No nephrolithiasis, hydronephrosis or solid renal masses. The unopacified ureters are normal in course and caliber. Delayed imaging through the kidneys demonstrates symmetric prompt contrast excretion within the proximal urinary collecting system. Urinary bladder is partially distended and unremarkable. Normal adrenal glands. STOMACH/BOWEL: The stomach, small and large bowel are normal in course and caliber without inflammatory changes. Mildly thickened: Consistent with ascites. Normal appendix. VASCULAR/LYMPHATIC: Aortoiliac vessels are normal in course and caliber. Moderate calcific atherosclerosis. No lymphadenopathy by CT size criteria. REPRODUCTIVE: Normal. OTHER: Moderate ascites  decreased from prior examination. No intraperitoneal free air. MUSCULOSKELETAL: Nonacute. Mild anasarca. Old RIGHT L3 and L4 transverse process fractures. RIGHT hip loose bodies. Moderate to severe LEFT hip osteoarthrosis. IMPRESSION: 1. No acute intra-abdominal/pelvic process. 2. Moderate ascites, decreased from prior examination. 3. Stable hepatomegaly with findings of passive hepatic congestion. Aortic Atherosclerosis (ICD10-I70.0). Electronically Signed   By: CElon AlasM.D.   On: 07/29/2018 00:33    Procedures Procedures (including critical care time)  Medications Ordered in ED Medications  iopamidol (ISOVUE-300) 61 % injection (has no  administration in time range)  ondansetron (ZOFRAN) injection 4 mg (4 mg Intravenous Given 07/28/18 2222)  sodium chloride 0.9 % bolus 1,000 mL (0 mLs Intravenous Stopped 07/29/18 0222)  ondansetron (ZOFRAN) injection 4 mg (4 mg Intravenous Given 07/28/18 2353)  morphine 4 MG/ML injection 4 mg (4 mg Intravenous Given 07/28/18 2353)  iopamidol (ISOVUE-300) 61 % injection 100 mL (80 mLs Intravenous Contrast Given 07/29/18 0003)  diphenhydrAMINE (BENADRYL) injection 25 mg (25 mg Intravenous Given 07/29/18 0148)     Initial Impression / Assessment and Plan / ED Course  I have reviewed the triage vital signs and the nursing notes.  Pertinent labs & imaging results that were available during my care of the patient were reviewed by me and considered in my medical decision making (see chart for details).     Patient with nausea, vomiting, diarrhea.  Onset of symptoms was yesterday.  She has had some abdominal discomfort.  Laboratory work-up is reassuring.  Patient initially mildly hypothermic in triage, given bear hugger.  Temperature normalized.  Patient has slept the night of the emergency department and has remained comfortable.  She is eating and drinking.  CT abdomen pelvis was negative for acute process.  We will discharged home with PCP  follow-up.  Final Clinical Impressions(s) / ED Diagnoses   Final diagnoses:  Nausea vomiting and diarrhea    ED Discharge Orders         Ordered    ondansetron (ZOFRAN ODT) 4 MG disintegrating tablet  Every 8 hours PRN     07/29/18 0210           Montine Circle, PA-C 07/29/18 0554    Orpah Greek, MD 07/29/18 [phone]

## 2018-07-29 NOTE — ED Notes (Signed)
PT tolerating PO fluids and food

## 2018-07-30 ENCOUNTER — Other Ambulatory Visit: Payer: Self-pay

## 2018-07-30 ENCOUNTER — Inpatient Hospital Stay (HOSPITAL_COMMUNITY)
Admission: EM | Admit: 2018-07-30 | Discharge: 2018-08-02 | DRG: 564 | Disposition: A | Discharge status: Home / Self Care | Payer: Medicaid Other | Attending: Internal Medicine | Admitting: Internal Medicine

## 2018-07-30 ENCOUNTER — Inpatient Hospital Stay (HOSPITAL_COMMUNITY): Payer: Medicaid Other

## 2018-07-30 ENCOUNTER — Emergency Department (HOSPITAL_COMMUNITY): Payer: Medicaid Other

## 2018-07-30 ENCOUNTER — Encounter (HOSPITAL_COMMUNITY): Payer: Self-pay

## 2018-07-30 DIAGNOSIS — M869 Osteomyelitis, unspecified: Secondary | ICD-10-CM | POA: Diagnosis present

## 2018-07-30 DIAGNOSIS — L299 Pruritus, unspecified: Secondary | ICD-10-CM | POA: Diagnosis not present

## 2018-07-30 DIAGNOSIS — I10 Essential (primary) hypertension: Secondary | ICD-10-CM | POA: Diagnosis present

## 2018-07-30 DIAGNOSIS — Z681 Body mass index (BMI) 19 or less, adult: Secondary | ICD-10-CM | POA: Diagnosis not present

## 2018-07-30 DIAGNOSIS — I110 Hypertensive heart disease with heart failure: Secondary | ICD-10-CM | POA: Diagnosis present

## 2018-07-30 DIAGNOSIS — L97111 Non-pressure chronic ulcer of right thigh limited to breakdown of skin: Secondary | ICD-10-CM

## 2018-07-30 DIAGNOSIS — G9340 Encephalopathy, unspecified: Secondary | ICD-10-CM | POA: Diagnosis not present

## 2018-07-30 DIAGNOSIS — Z9119 Patient's noncompliance with other medical treatment and regimen: Secondary | ICD-10-CM

## 2018-07-30 DIAGNOSIS — Z9114 Patient's other noncompliance with medication regimen: Secondary | ICD-10-CM

## 2018-07-30 DIAGNOSIS — Z800 Family history of malignant neoplasm of digestive organs: Secondary | ICD-10-CM

## 2018-07-30 DIAGNOSIS — Z720 Tobacco use: Secondary | ICD-10-CM | POA: Diagnosis not present

## 2018-07-30 DIAGNOSIS — I776 Arteritis, unspecified: Secondary | ICD-10-CM

## 2018-07-30 DIAGNOSIS — Z886 Allergy status to analgesic agent status: Secondary | ICD-10-CM

## 2018-07-30 DIAGNOSIS — Z885 Allergy status to narcotic agent status: Secondary | ICD-10-CM

## 2018-07-30 DIAGNOSIS — Z89022 Acquired absence of left finger(s): Secondary | ICD-10-CM

## 2018-07-30 DIAGNOSIS — Z888 Allergy status to other drugs, medicaments and biological substances status: Secondary | ICD-10-CM

## 2018-07-30 DIAGNOSIS — F14988 Cocaine use, unspecified with other cocaine-induced disorder: Secondary | ICD-10-CM | POA: Diagnosis not present

## 2018-07-30 DIAGNOSIS — L97909 Non-pressure chronic ulcer of unspecified part of unspecified lower leg with unspecified severity: Secondary | ICD-10-CM | POA: Diagnosis present

## 2018-07-30 DIAGNOSIS — H5789 Other specified disorders of eye and adnexa: Secondary | ICD-10-CM | POA: Diagnosis present

## 2018-07-30 DIAGNOSIS — K0889 Other specified disorders of teeth and supporting structures: Secondary | ICD-10-CM | POA: Diagnosis not present

## 2018-07-30 DIAGNOSIS — H02843 Edema of right eye, unspecified eyelid: Secondary | ICD-10-CM | POA: Diagnosis not present

## 2018-07-30 DIAGNOSIS — Y835 Amputation of limb(s) as the cause of abnormal reaction of the patient, or of later complication, without mention of misadventure at the time of the procedure: Secondary | ICD-10-CM | POA: Diagnosis not present

## 2018-07-30 DIAGNOSIS — Z89612 Acquired absence of left leg above knee: Secondary | ICD-10-CM

## 2018-07-30 DIAGNOSIS — I5032 Chronic diastolic (congestive) heart failure: Secondary | ICD-10-CM | POA: Diagnosis present

## 2018-07-30 DIAGNOSIS — F17210 Nicotine dependence, cigarettes, uncomplicated: Secondary | ICD-10-CM | POA: Diagnosis not present

## 2018-07-30 DIAGNOSIS — F14288 Cocaine dependence with other cocaine-induced disorder: Secondary | ICD-10-CM | POA: Diagnosis present

## 2018-07-30 DIAGNOSIS — Z5900 Homelessness unspecified: Secondary | ICD-10-CM

## 2018-07-30 DIAGNOSIS — R945 Abnormal results of liver function studies: Secondary | ICD-10-CM | POA: Diagnosis present

## 2018-07-30 DIAGNOSIS — Z803 Family history of malignant neoplasm of breast: Secondary | ICD-10-CM

## 2018-07-30 DIAGNOSIS — Z89611 Acquired absence of right leg above knee: Secondary | ICD-10-CM

## 2018-07-30 DIAGNOSIS — Z79899 Other long term (current) drug therapy: Secondary | ICD-10-CM

## 2018-07-30 DIAGNOSIS — Z811 Family history of alcohol abuse and dependence: Secondary | ICD-10-CM

## 2018-07-30 DIAGNOSIS — I999 Unspecified disorder of circulatory system: Secondary | ICD-10-CM

## 2018-07-30 DIAGNOSIS — G92 Toxic encephalopathy: Secondary | ICD-10-CM | POA: Diagnosis present

## 2018-07-30 DIAGNOSIS — L958 Other vasculitis limited to the skin: Secondary | ICD-10-CM | POA: Diagnosis present

## 2018-07-30 DIAGNOSIS — Z7189 Other specified counseling: Secondary | ICD-10-CM | POA: Diagnosis not present

## 2018-07-30 DIAGNOSIS — E43 Unspecified severe protein-calorie malnutrition: Secondary | ICD-10-CM | POA: Diagnosis present

## 2018-07-30 DIAGNOSIS — Z8679 Personal history of other diseases of the circulatory system: Secondary | ICD-10-CM

## 2018-07-30 DIAGNOSIS — R7989 Other specified abnormal findings of blood chemistry: Secondary | ICD-10-CM | POA: Diagnosis present

## 2018-07-30 DIAGNOSIS — T8789 Other complications of amputation stump: Principal | ICD-10-CM | POA: Diagnosis present

## 2018-07-30 DIAGNOSIS — T8781 Dehiscence of amputation stump: Secondary | ICD-10-CM | POA: Diagnosis present

## 2018-07-30 DIAGNOSIS — Z515 Encounter for palliative care: Secondary | ICD-10-CM | POA: Diagnosis not present

## 2018-07-30 DIAGNOSIS — Z993 Dependence on wheelchair: Secondary | ICD-10-CM | POA: Diagnosis not present

## 2018-07-30 LAB — BLOOD GAS, VENOUS
ACID-BASE DEFICIT: 4.6 mmol/L — AB (ref 0.0–2.0)
BICARBONATE: 19.6 mmol/L — AB (ref 20.0–28.0)
FIO2: 21
O2 SAT: 96.8 %
PATIENT TEMPERATURE: 98.6
pCO2, Ven: 35 mmHg — ABNORMAL LOW (ref 44.0–60.0)
pH, Ven: 7.366 (ref 7.250–7.430)
pO2, Ven: 99.6 mmHg — ABNORMAL HIGH (ref 32.0–45.0)

## 2018-07-30 LAB — COMPREHENSIVE METABOLIC PANEL
ALT: 145 U/L — ABNORMAL HIGH (ref 0–44)
ANION GAP: 12 (ref 5–15)
AST: 195 U/L — ABNORMAL HIGH (ref 15–41)
Albumin: 3.4 g/dL — ABNORMAL LOW (ref 3.5–5.0)
Alkaline Phosphatase: 115 U/L (ref 38–126)
BUN: 28 mg/dL — ABNORMAL HIGH (ref 6–20)
CHLORIDE: 104 mmol/L (ref 98–111)
CO2: 18 mmol/L — AB (ref 22–32)
CREATININE: 0.8 mg/dL (ref 0.44–1.00)
Calcium: 8.6 mg/dL — ABNORMAL LOW (ref 8.9–10.3)
Glucose, Bld: 115 mg/dL — ABNORMAL HIGH (ref 70–99)
Potassium: 4.6 mmol/L (ref 3.5–5.1)
SODIUM: 134 mmol/L — AB (ref 135–145)
Total Bilirubin: 3.2 mg/dL — ABNORMAL HIGH (ref 0.3–1.2)
Total Protein: 6.6 g/dL (ref 6.5–8.1)

## 2018-07-30 LAB — CBC WITH DIFFERENTIAL/PLATELET
Abs Immature Granulocytes: 0.05 10*3/uL (ref 0.00–0.07)
BASOS PCT: 0 %
Basophils Absolute: 0 10*3/uL (ref 0.0–0.1)
EOS ABS: 0 10*3/uL (ref 0.0–0.5)
EOS PCT: 0 %
HCT: 35.4 % — ABNORMAL LOW (ref 36.0–46.0)
Hemoglobin: 11.2 g/dL — ABNORMAL LOW (ref 12.0–15.0)
Immature Granulocytes: 1 %
LYMPHS PCT: 15 %
Lymphs Abs: 1.3 10*3/uL (ref 0.7–4.0)
MCH: 26.6 pg (ref 26.0–34.0)
MCHC: 31.6 g/dL (ref 30.0–36.0)
MCV: 84.1 fL (ref 80.0–100.0)
MONO ABS: 0.7 10*3/uL (ref 0.1–1.0)
MONOS PCT: 7 %
Neutro Abs: 6.9 10*3/uL (ref 1.7–7.7)
Neutrophils Relative %: 77 %
PLATELETS: 297 10*3/uL (ref 150–400)
RBC: 4.21 MIL/uL (ref 3.87–5.11)
RDW: 20.2 % — AB (ref 11.5–15.5)
WBC: 9 10*3/uL (ref 4.0–10.5)
nRBC: 0 % (ref 0.0–0.2)

## 2018-07-30 LAB — I-STAT CG4 LACTIC ACID, ED: LACTIC ACID, VENOUS: 1.41 mmol/L (ref 0.5–1.9)

## 2018-07-30 MED ORDER — ONDANSETRON HCL 4 MG PO TABS: 4.0000 mg | ORAL_TABLET | Freq: Four times a day (QID) | ORAL | Status: DC | PRN

## 2018-07-30 MED ORDER — PIPERACILLIN-TAZOBACTAM 3.375 G IVPB 30 MIN
3.3750 g | Freq: Three times a day (TID) | INTRAVENOUS | Status: DC
  Administered 2018-07-31 – 2018-08-02 (×7): 3.375 g via INTRAVENOUS
  Filled 2018-07-30 (×8): qty 50

## 2018-07-30 MED ORDER — VANCOMYCIN HCL 500 MG IV SOLR
500.0000 mg | Freq: Two times a day (BID) | INTRAVENOUS | Status: DC
  Administered 2018-07-31 – 2018-08-02 (×4): 500 mg via INTRAVENOUS
  Filled 2018-07-30 (×5): qty 500

## 2018-07-30 MED ORDER — SODIUM CHLORIDE 0.9 % IV SOLN
INTRAVENOUS | Status: AC
  Administered 2018-07-31: via INTRAVENOUS

## 2018-07-30 MED ORDER — GABAPENTIN 100 MG PO CAPS
100.0000 mg | ORAL_CAPSULE | Freq: Three times a day (TID) | ORAL | Status: DC
  Administered 2018-07-31 – 2018-08-02 (×7): 100 mg via ORAL
  Filled 2018-07-30 (×7): qty 1

## 2018-07-30 MED ORDER — HYDRALAZINE HCL 20 MG/ML IJ SOLN: 10.0000 mg | INTRAMUSCULAR | Status: DC | PRN

## 2018-07-30 MED ORDER — ONDANSETRON HCL 4 MG/2ML IJ SOLN: 4.0000 mg | Freq: Four times a day (QID) | INTRAMUSCULAR | Status: DC | PRN

## 2018-07-30 MED ORDER — KETOROLAC TROMETHAMINE 30 MG/ML IJ SOLN
30.0000 mg | Freq: Once | INTRAMUSCULAR | Status: AC
  Administered 2018-07-30: 30 mg via INTRAVENOUS
  Filled 2018-07-30: qty 1

## 2018-07-30 MED ORDER — SODIUM CHLORIDE 0.9 % IV BOLUS
1000.0000 mL | Freq: Once | INTRAVENOUS | Status: AC
  Administered 2018-07-30: 1000 mL via INTRAVENOUS

## 2018-07-30 MED ORDER — HALOPERIDOL LACTATE 5 MG/ML IJ SOLN
2.0000 mg | Freq: Once | INTRAMUSCULAR | Status: AC
  Administered 2018-07-30: 2 mg via INTRAVENOUS
  Filled 2018-07-30: qty 1

## 2018-07-30 MED ORDER — HYDROXYZINE HCL 10 MG PO TABS
10.0000 mg | ORAL_TABLET | Freq: Three times a day (TID) | ORAL | Status: DC | PRN
  Filled 2018-07-30: qty 1

## 2018-07-30 MED ORDER — VANCOMYCIN HCL IN DEXTROSE 1-5 GM/200ML-% IV SOLN
1000.0000 mg | Freq: Once | INTRAVENOUS | Status: AC
  Administered 2018-07-30: 1000 mg via INTRAVENOUS
  Filled 2018-07-30: qty 200

## 2018-07-30 MED ORDER — PIPERACILLIN-TAZOBACTAM 3.375 G IVPB 30 MIN
3.3750 g | Freq: Once | INTRAVENOUS | Status: AC
  Administered 2018-07-30: 3.375 g via INTRAVENOUS
  Filled 2018-07-30: qty 50

## 2018-07-30 NOTE — ED Notes (Signed)
[facility] will be contacted after VBG results per MD request

## 2018-07-30 NOTE — ED Notes (Signed)
[place], MD paged regarding if pt can be transported to [facility] now that the VBG has resulted

## 2018-07-30 NOTE — ED Notes (Signed)
ED TO INPATIENT HANDOFF REPORT  Name/Age/Gender [name] 57 y.o. female  Code Status Code Status History    Date Active Date Inactive Code Status Order ID Comments User Context   07/11/2018 0327 07/13/2018 1903 Full Code [id number]  Rise Patience, MD Inpatient   07/10/2018 0130 07/11/2018 0327 Full Code [id number]  Larene Pickett, PA-C ED   06/21/2018 0247 06/23/2018 2203 Full Code [id number]  Etta Quill, DO ED   05/20/2018 0145 05/23/2018 1806 Full Code [id number]  Bethena Roys, MD Inpatient   02/02/2018 1830 03/03/2018 0033 Full Code [id number]  Annita Brod, MD Inpatient   02/02/2018 1325 02/02/2018 1830 Full Code [id number]  Jola Schmidt, MD ED   10/23/2017 1300 10/26/2017 2237 Full Code [id number]  Ina Homes, MD ED   08/09/2017 2116 08/11/2017 1245 Full Code [id number]  Street, [place], PA-C ED   06/05/2017 1510 06/06/2017 2155 Full Code [id number]  Elwin Mocha, MD ED   04/13/2017 1822 04/17/2017 1139 Full Code [id number]  Cathlyn Parsons, PA-C Inpatient   04/13/2017 1822 04/13/2017 1822 Full Code [id number]  Cathlyn Parsons, PA-C Inpatient   04/08/2017 0835 04/13/2017 1817 Full Code [id number]  Lavina Hamman, MD ED   03/30/2017 1851 04/01/2017 2024 Full Code [id number]  Verlee Monte, MD Inpatient   02/06/2017 0605 02/08/2017 2031 Full Code [id number]  Riccardo Dubin, MD ED   12/22/2016 2131 12/28/2016 2234 Full Code [id number]  Burgess Estelle, MD Inpatient   10/14/2016 1525 10/24/2016 2253 Full Code [id number]  Jule Ser, DO Inpatient   09/21/2016 0107 09/22/2016 1910 Full Code [id number]  Junius Creamer, NP ED   09/09/2016 1722 09/11/2016 1701 Full Code [id number]  Juliet Rude, MD Inpatient   07/25/2016 0525 07/29/2016 2120 Full Code [id number]  Juliet Rude, MD Inpatient   06/14/2016 1513 06/19/2016 1931 Full Code [id number]  Florinda Marker, MD Inpatient   05/18/2016 1959 05/23/2016 1717 Full Code [id number]  Juliet Rude, MD Inpatient   04/27/2016 0911 05/01/2016 1916 Full Code  [id number]  Florinda Marker, MD Inpatient   04/01/2016 1727 04/04/2016 1651 Full Code [id number]  Jones Bales, MD Inpatient   11/14/2015 2316 11/15/2015 1729 Full Code [id number]  Juluis Mire, MD ED   10/23/2015 1615 10/29/2015 1636 Full Code [id number]  Juliet Rude, MD Inpatient   09/25/2015 0536 10/02/2015 1911 Full Code [id number]  Riccardo Dubin, MD ED   06/15/2015 0147 06/17/2015 2039 Full Code [id number]  Vickii Chafe, MD Inpatient   06/04/2015 2045 06/05/2015 2133 Full Code [id number]  Corky Sox, MD Inpatient   04/21/2015 1405 04/22/2015 2225 Full Code [id number]  Corky Sox, MD Inpatient   04/08/2015 0101 04/08/2015 2015 Full Code [id number]  Dellia Nims, MD Inpatient   03/09/2015 2135 03/12/2015 0031 Full Code [id number]  Karlene Einstein, MD Inpatient   01/14/2015 1734 01/17/2015 0110 Full Code [id number]  Juliet Rude, MD Inpatient   10/09/2014 2245 10/17/2014 1738 Full Code [id number]  Laverle Hobby, PA-C Inpatient   10/09/2014 0300 10/09/2014 2245 Full Code [id number]  Everlene Balls, MD ED   05/07/2014 1709 05/08/2014 1923 Full Code [id number]  Benjamine Mola, [place] Inpatient   05/07/2014 0835 05/07/2014 1709 Full Code [id number]  Ernestina Patches, MD ED   05/07/2014 0108 05/07/2014 0835 Full Code [id number]  Carmin Muskrat, MD ED   04/19/2014 1726 04/22/2014 1422 Full Code [id number]  Clinton Gallant, MD Inpatient   03/30/2014 0327 03/31/2014 2008 Full Code [id number]  Cresenciano Genre Inpatient   01/13/2014 1823 01/26/2014 1651 Full Code [id number]  Waylan Boga, NP Inpatient   01/13/2014 0457 01/13/2014 1823 Full Code [id number]  Garald Balding, NP ED   10/11/2013 0450 10/17/2013 2005 Full Code [id number]  Blain Pais, MD ED   09/24/2013 1852 10/02/2013 1807 Full Code [id number]  Blain Pais, MD Inpatient   08/30/2013 0406 08/30/2013 2340 Full Code [id number]  Martie Lee, PA-C ED   06/23/2013 2105 06/28/2013 1756 Full Code [id number]  Delfin Gant, NP Inpatient   06/22/2013 1601 06/23/2013 2105 Full  Code [id number]  Mariea Clonts, MD ED   06/13/2013 2340 06/14/2013 2045 Full Code [id number]  Othella Boyer, MD Inpatient   05/13/2013 1123 05/14/2013 2124 Full Code [id number]  Pisciotta, Charna Elizabeth ED   02/13/2012 1603 02/15/2012 1829 Full Code [id number]  Shary Decamp, RN Inpatient   10/14/2011 1306 10/14/2011 1306 DNR [id number]  Thera Flake, MD Inpatient      Home/SNF/Other street  Chief Complaint leg pain   Level of Care/Admitting Diagnosis ED Disposition    ED Disposition Condition [place]: [place] [[id number]]  Level of Care: Telemetry [5]  Diagnosis: Osteomyelitis [facility]) [[id number]]  Admitting Physician: Toy Baker [[id number]]  Attending Physician: Toy Baker [[id number]]  Estimated length of stay: 3 - 4 days  Certification:: I certify this patient will need inpatient services for at least 2 midnights  PT Class (Do Not Modify): Inpatient [101]  PT Acc Code (Do Not Modify): Private [1]       Medical History Past Medical History:  Diagnosis Date  . CAP (community acquired pneumonia) 03/2014 X 2  . Cocaine abuse ([place])    ongoing with resultant vaculitis.  . Depression   . Headache    "weekly" (07/29/2016)  . Hypertension   . Inflammatory arthritis   . Migraines    "probably 5-6/yr" (07/29/2016)  . Normocytic anemia    BL Hgb 9.8-12. Last anemia panel 04/2010 - showing Fe 19, ferritin 101.  Pt on monthly B12 injections  . Rheumatoid arthritis(714.0)    patient reported  . 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 Followed by Dr. Louanne Skye    Allergies Allergies  Allergen Reactions  . Acetaminophen Swelling and Other (See Comments)    Reaction:  Eyelid swelling  . Lisinopril     UNSPECIFIED REACTION     IV Location/Drains/Wounds Patient Lines/Drains/Airways Status   Active Line/Drains/Airways    Name:   Placement date:   Placement time:   Site:   Days:    Peripheral IV 07/30/18 Right Forearm   07/30/18    -    Forearm   less than 1   Incision (Closed) 02/06/18 Thigh Left   02/06/18    0809     174   Incision (Closed) 02/06/18 Thigh Right   02/06/18    0809     174   Wound / Incision (Open or Dehisced) 02/02/18 Other (Comment) Knee Bilateral;Distal   02/02/18    1636    Knee   178   Wound / Incision (Open or Dehisced) 02/02/18 Other (Comment) Sacrum Mid   02/02/18    1636    Sacrum   178   Wound / Incision (Open or Dehisced) 02/02/18 Other (Comment) Ear Bilateral   02/02/18    1636    Ear   178  Labs/Imaging Results for orders placed or performed during the hospital encounter of 07/30/18 (from the past 48 hour(s))  CBC with Differential     Status: Abnormal   Collection Time: 07/30/18  5:01 PM  Result Value Ref Range   WBC 9.0 4.0 - 10.5 K/uL   RBC 4.21 3.87 - 5.11 MIL/uL   Hemoglobin 11.2 (L) 12.0 - 15.0 g/dL   HCT 35.4 (L) 36.0 - 46.0 %   MCV 84.1 80.0 - 100.0 fL   MCH 26.6 26.0 - 34.0 pg   MCHC 31.6 30.0 - 36.0 g/dL   RDW 20.2 (H) 11.5 - 15.5 %   Platelets 297 150 - 400 K/uL   nRBC 0.0 0.0 - 0.2 %   Neutrophils Relative % 77 %   Neutro Abs 6.9 1.7 - 7.7 K/uL   Lymphocytes Relative 15 %   Lymphs Abs 1.3 0.7 - 4.0 K/uL   Monocytes Relative 7 %   Monocytes Absolute 0.7 0.1 - 1.0 K/uL   Eosinophils Relative 0 %   Eosinophils Absolute 0.0 0.0 - 0.5 K/uL   Basophils Relative 0 %   Basophils Absolute 0.0 0.0 - 0.1 K/uL   Immature Granulocytes 1 %   Abs Immature Granulocytes 0.05 0.00 - 0.07 K/uL    Comment: Performed at [facility], [facility] [address], [place], [place] [address]  Comprehensive metabolic panel     Status: Abnormal   Collection Time: 07/30/18  5:01 PM  Result Value Ref Range   Sodium 134 (L) 135 - 145 mmol/L   Potassium 4.6 3.5 - 5.1 mmol/L   Chloride 104 98 - 111 mmol/L   CO2 18 (L) 22 - 32 mmol/L   Glucose, Bld 115 (H) 70 - 99 mg/dL   BUN 28 (H) 6 - 20 mg/dL   Creatinine, Ser 0.80  0.44 - 1.00 mg/dL   Calcium 8.6 (L) 8.9 - 10.3 mg/dL   Total Protein 6.6 6.5 - 8.1 g/dL   Albumin 3.4 (L) 3.5 - 5.0 g/dL   AST 195 (H) 15 - 41 U/L   ALT 145 (H) 0 - 44 U/L   Alkaline Phosphatase 115 38 - 126 U/L   Total Bilirubin 3.2 (H) 0.3 - 1.2 mg/dL   GFR calc non Af Amer >60 >60 mL/min   GFR calc Af Amer >60 >60 mL/min    Comment: (NOTE) The eGFR has been calculated using the CKD EPI equation. This calculation has not been validated in all clinical situations. eGFR's persistently <60 mL/min signify possible Chronic Kidney Disease.    Anion gap 12 5 - 15    Comment: Performed at [facility], [place] [address], [place], [place] [address]  I-Stat CG4 Lactic Acid, ED     Status: None   Collection Time: 07/30/18  5:52 PM  Result Value Ref Range   Lactic Acid, Venous 1.41 0.5 - 1.9 mmol/L   Dg Chest 2 View  Result Date: 07/30/2018 CLINICAL DATA:  Pt BIB EMS found [place] bench [place] bus [place]. Pt reports severe pain in both legs. Redness and swelling noted to the sites on both legs. Pt has bilateral AKA amputations. Hx of HTN and community acquired pneumonia. EXAM: CHEST - 2 VIEW COMPARISON:  07/24/2018 and older exams. FINDINGS: Cardiac silhouette is mildly enlarged. No mediastinal or hilar masses. No evidence of adenopathy. Prominent nipple shadow overlies the right mid to lower lung. Mild linear scarring extends laterally from the right hilum, stable. Lungs are otherwise  clear. No pleural effusion or pneumothorax. Skeletal structures are intact. IMPRESSION: No acute cardiopulmonary disease. Electronically Signed   By: Lajean Manes M.D.   On: 07/30/2018 18:05   Ct Abdomen Pelvis W Contrast  Result Date: 07/29/2018 CLINICAL DATA:  Abdominal pain, nausea, vomiting and diarrhea beginning this afternoon. History of substance abuse, hernia repair. EXAM: CT ABDOMEN AND PELVIS WITH CONTRAST TECHNIQUE: Multidetector CT imaging of the abdomen and pelvis was performed  using the standard protocol following bolus administration of intravenous contrast. CONTRAST:  62m ISOVUE-300 IOPAMIDOL (ISOVUE-300) INJECTION 61% COMPARISON:  CT abdomen and pelvis June 21, 2018 FINDINGS: LOWER CHEST: Lung bases are clear. Stable cardiomegaly, reflux contrast consistent with RIGHT heart failure. No pericardial effusion. HEPATOBILIARY: Heterogeneous liver with enlarged IVC with contrast reflux consistent with passive hepatic congestion. Mild hepatomegaly. Normal gallbladder. PANCREAS: Too small to characterize hypodensity in spleen. SPLEEN: Normal. ADRENALS/URINARY TRACT: Kidneys are orthotopic, demonstrating symmetric enhancement. Multifocal scarring bilateral kidneys. No nephrolithiasis, hydronephrosis or solid renal masses. The unopacified ureters are normal in course and caliber. Delayed imaging through the kidneys demonstrates symmetric prompt contrast excretion within the proximal urinary collecting system. Urinary bladder is partially distended and unremarkable. Normal adrenal glands. STOMACH/BOWEL: The stomach, small and large bowel are normal in course and caliber without inflammatory changes. Mildly thickened: Consistent with ascites. Normal appendix. VASCULAR/LYMPHATIC: Aortoiliac vessels are normal in course and caliber. Moderate calcific atherosclerosis. No lymphadenopathy by CT size criteria. REPRODUCTIVE: Normal. OTHER: Moderate ascites decreased from prior examination. No intraperitoneal free air. MUSCULOSKELETAL: Nonacute. Mild anasarca. Old RIGHT L3 and L4 transverse process fractures. RIGHT hip loose bodies. Moderate to severe LEFT hip osteoarthrosis. IMPRESSION: 1. No acute intra-abdominal/pelvic process. 2. Moderate ascites, decreased from prior examination. 3. Stable hepatomegaly with findings of passive hepatic congestion. Aortic Atherosclerosis (ICD10-I70.0). Electronically Signed   By: CElon AlasM.D.   On: 07/29/2018 00:33   Dg Femur Min 2 Views  Left  Result Date: 07/30/2018 CLINICAL DATA:  Patient found laying on a bench at a bus stop with pain in the legs. Erythema and swelling noted at site of amputation. EXAM: LEFT FEMUR 2 VIEWS COMPARISON:  None. FINDINGS: Periosteal new bone formation is noted about the tip of the remaining left femoral diaphysis status post above knee amputation. Findings raise concern for changes of osteomyelitis. Osteoarthritis of the left hip with moderate-to-marked joint space narrowing is identified. No acute fracture. IMPRESSION: Periosteal new bone formation about the tip of the remaining femoral diaphysis is noted. Findings raise concern for osteomyelitis. Electronically Signed   By: DAshley RoyaltyM.D.   On: 07/30/2018 18:04   Dg Femur Min 2 Views Right  Result Date: 07/30/2018 CLINICAL DATA:  Pt BIB EMS found laying on a bench at the bus stop. Pt reports severe pain in both legs. Redness and swelling noted to the sites on both legs. Pt has bilateral AKA amputations. Hx of HTN and community acquired pneumonia. EXAM: RIGHT FEMUR 2 VIEWS COMPARISON:  None. FINDINGS: Previous amputation across the midshaft of the right femur. Amputated margin appears well defined no focal bone resorption is seen to suggest osteomyelitis. There is no acute fracture. Hip joint is normally spaced and aligned. No soft tissue air. IMPRESSION: 1. No fracture.  No evidence of osteomyelitis.  No acute finding. Electronically Signed   By: DLajean ManesM.D.   On: 07/30/2018 18:02    Pending Labs Unresulted Labs (From admission, onward)    Start     Ordered   07/30/18 1937  Protime-INR  Once,   R     07/30/18 1936   07/30/18 1929  Rapid urine drug screen (hospital performed)  STAT,   R     07/30/18 1928   07/30/18 1702  Blood culture (routine x 2)  BLOOD CULTURE X 2,   STAT     07/30/18 1701          Vitals/Pain Today's Vitals   07/30/18 1609 07/30/18 1610 07/30/18 1753  BP:  (!) 187/125 (!) 182/114  Pulse:  (!) 101 99  Resp:   19 19  Temp:  97.6 F (36.4 C)   TempSrc:  Oral   SpO2:  100% 100%  Weight: 46.7 kg    Height: 5' 1"  (1.549 m)    PainSc: 10-Worst pain ever      Isolation Precautions No active isolations  Medications Medications  vancomycin (VANCOCIN) IVPB 1000 mg/200 mL premix (has no administration in time range)  piperacillin-tazobactam (ZOSYN) IVPB 3.375 g (3.375 g Intravenous New Bag/Given 07/30/18 1945)  sodium chloride 0.9 % bolus 1,000 mL (1,000 mLs Intravenous New Bag/Given 07/30/18 1751)  ketorolac (TORADOL) 30 MG/ML injection 30 mg (30 mg Intravenous Given 07/30/18 1751)  haloperidol lactate (HALDOL) injection 2 mg (2 mg Intravenous Given 07/30/18 1754)    Mobility non-ambulatory

## 2018-07-30 NOTE — H&P (Signed)
[name] QZR:[id number] DOB: [birth year] DOA: 07/30/2018    PCP: Azzie Glatter, FNP   Outpatient Specialists:   Farley Ly  Patient arrived to ER on 07/30/18 at 1555  Patient coming from: homeless  Chief Complaint:  Chief Complaint  Patient presents with  . Leg Pain    HPI: [name] is a 57 y.o. female with medical history significant of cocaine abuse, grade 2 diastolic congestive heart failure with ejection fraction 60 to 65%, essential hypertension, peripheral vascular disease status post bilateral AKA     Presented with stump pain patient have been frequently visiting emergency department with various complaints including nausea vomiting epigastric pain, chest pain, leg pain.  Yesterday CT of abdomen was negative for acute process and she was discharged home today patient presented again after she was found lying down on the bench at the past stop due to severe pain with both legs redness and swelling. Pruritic rash similar to prior.    She is homeless and has nowhere to stay.  Continues to abuse cocaine [place] any fevers or chills Patient has history of poor wound healing from bilateral BKA in the setting of ongoing cocaine abuse and possibility of vasculitis perhaps induced by levamisole.  Punch biopsy was performed by general surgery on 10/8 and results were sent to pathology Results showed Dermatitis with differential possible: erythema multiforme, a drug reaction and less likely a connective tissue disorder. There is focal secondary extravasation of erythrocytes but there is no evidence of vasculitis or vasculopathy. Correlation with the clinical findings is suggested Regarding pertinent Chronic problems: Ongoing drug abuse   While in ER: Initially hypertensive and systolic blood pressure in 180s Now down to150's  Noted to have significant eye edema that has worsened initially during ER stay this gotten worse somewhat after administration of Haldol and  Toradol but before administration of IV antibiotics patient has history of allergic reaction with eye swelling to acetaminophen.  States that she has not been exposed to acetaminophen recently suspect may be similar reaction to Toradol  Patient at first was very agitated difficult to obtain history she received Haldol in the emergency department and was able to be more calm. Plain imaging showed evidence of osteomyelitis orthopedics been consulted patient was started on vancomycin and Zosyn The following Work up has been ordered so far:  Orders Placed This Encounter  Procedures  . Blood culture (routine x 2)  . DG Femur Min 2 Views Left  . DG Femur Min 2 Views Right  . DG Chest 2 View  . CBC with Differential  . Comprehensive metabolic panel  . Rapid urine drug screen (hospital performed)  . Protime-INR  . Consult to hospitalist  . Consult to orthopedic surgery  . I-Stat CG4 Lactic Acid, ED    Following Medications were ordered in ER: Medications  vancomycin (VANCOCIN) IVPB 1000 mg/200 mL premix (has no administration in time range)  piperacillin-tazobactam (ZOSYN) IVPB 3.375 g (has no administration in time range)  sodium chloride 0.9 % bolus 1,000 mL (1,000 mLs Intravenous New Bag/Given 07/30/18 1751)  ketorolac (TORADOL) 30 MG/ML injection 30 mg (30 mg Intravenous Given 07/30/18 1751)  haloperidol lactate (HALDOL) injection 2 mg (2 mg Intravenous Given 07/30/18 1754)    Significant initial  Findings: Abnormal Labs Reviewed  CBC WITH DIFFERENTIAL/PLATELET - Abnormal; Notable for the following components:      Result Value   Hemoglobin 11.2 (*)    HCT 35.4 (*)    RDW  20.2 (*)    All other components within normal limits  COMPREHENSIVE METABOLIC PANEL - Abnormal; Notable for the following components:   Sodium 134 (*)    CO2 18 (*)    Glucose, Bld 115 (*)    BUN 28 (*)    Calcium 8.6 (*)    Albumin 3.4 (*)    AST 195 (*)    ALT 145 (*)    Total Bilirubin 3.2 (*)    All  other components within normal limits   Lactic acid 1.41  Na 134 K 4.6  Cr   stable,   Lab Results  Component Value Date   CREATININE 0.80 07/30/2018   CREATININE 1.11 (H) 07/28/2018   CREATININE 0.79 07/24/2018   AST elevated 195 ALT 145 total bilirubin 3.2  WBC  9.0  HG/HCT   stable,       Component Value Date/Time   HGB 11.2 (L) 07/30/2018 1701   HCT 35.4 (L) 07/30/2018 1701      BNP (last 3 results) Recent Labs    05/19/18 2116 06/20/18 2239  BNP 3,603.1* 3,612.1*    ProBNP (last 3 results) No results for input(s): PROBNP in the last 8760 hours.  Lactic Acid, Venous    Component Value Date/Time   LATICACIDVEN 1.41 07/30/2018 1752      UA ordered    CXR -  NON acute  CTabd/pelvis -  nonacute  From 07/29/2018 Left femur Periosteal new bone formation about the tip of the remaining femoral diaphysis is noted. Findings raise concern for osteomyelitis. ECG:  Ordered   ED Triage Vitals  Enc Vitals Group     BP 07/30/18 1610 (!) 187/125     Pulse Rate 07/30/18 1610 (!) 101     Resp 07/30/18 1610 19     Temp 07/30/18 1610 97.6 F (36.4 C)     Temp Source 07/30/18 1610 Oral     SpO2 07/30/18 1610 100 %     Weight 07/30/18 1609 102 lb 15.3 oz (46.7 kg)     Height 07/30/18 1609 5' 1"  (1.549 m)     Head Circumference --      Peak Flow --      Pain Score 07/30/18 1609 10     Pain Loc --      Pain Edu? --      Excl. in [place]? --   TMAX(24)@       Latest  Blood pressure (!) 182/114, pulse 99, temperature 97.6 F (36.4 C), temperature source Oral, resp. rate 19, height 5' 1"  (1.549 m), weight 46.7 kg, SpO2 100 %.      ER Provider Called:     Dr. Ninfa Linden They Recommend admit to [facility] where Dr. Sharol Given can operate if indicated Will see in AM   Hospitalist was called for admission for osteomyelitis of left AKA stump   Review of Systems:    Pertinent positives include: Eye swelling rash on bilateral AKA stumps, pruritus  Constitutional:  No weight  loss, night sweats, Fevers, chills, fatigue, weight loss  HEENT:  No headaches, Difficulty swallowing,Tooth/dental problems,Sore throat,  No sneezing, itching, ear ache, nasal congestion, post nasal drip,  Cardio-vascular:  No chest pain, Orthopnea, PND, anasarca, dizziness, palpitations.no Bilateral lower extremity swelling  GI:  No heartburn, indigestion, abdominal pain, nausea, vomiting, diarrhea, change in bowel habits, loss of appetite, melena, blood in stool, hematemesis Resp:  no shortness of breath at rest. No dyspnea on exertion, No excess mucus, no productive cough, No  non-productive cough, No coughing up of blood.No change in color of mucus.No wheezing. Skin:  no rash or lesions. No jaundice GU:  no dysuria, change in color of urine, no urgency or frequency. No straining to urinate.  No flank pain.  Musculoskeletal:  No joint pain or no joint swelling. No decreased range of motion. No back pain.  Psych:  No change in mood or affect. No depression or anxiety. No memory loss.  Neuro: no localizing neurological complaints, no tingling, no weakness, no double vision, no gait abnormality, no slurred speech, no confusion  All systems reviewed and apart from [place] all are negative  Past Medical History:   Past Medical History:  Diagnosis Date  . CAP (community acquired pneumonia) 03/2014 X 2  . Cocaine abuse ([place])    ongoing with resultant vaculitis.  . Depression   . Headache    "weekly" (07/29/2016)  . Hypertension   . Inflammatory arthritis   . Migraines    "probably 5-6/yr" (07/29/2016)  . Normocytic anemia    BL Hgb 9.8-12. Last anemia panel 04/2010 - showing Fe 19, ferritin 101.  Pt on monthly B12 injections  . Rheumatoid arthritis(714.0)    patient reported  . 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 Followed by Dr. Louanne Skye      Past Surgical History:  Procedure Laterality Date  . AMPUTATION Left 05/22/2016     Procedure: AMPUTATION LEFT LONG FINGER;  Surgeon: Marybelle Killings, MD;  Location: [place];  Service: Orthopedics;  Laterality: Left;  . AMPUTATION Bilateral 04/10/2017   Procedure: AMPUTATION BELOW KNEE;  Surgeon: Newt Minion, MD;  Location: [place];  Service: Orthopedics;  Laterality: Bilateral;  . AMPUTATION Bilateral 02/06/2018   Procedure: AMPUTATION ABOVE KNEE;  Surgeon: Newt Minion, MD;  Location: [place];  Service: Orthopedics;  Laterality: Bilateral;  . HERNIA REPAIR     "stomach"  . I&D EXTREMITY Right 09/26/2015   Procedure: IRRIGATION AND DEBRIDEMENT LEG WOUND  VAC PLACEMENT.;  Surgeon: Loel Lofty Dillingham, DO;  Location: [place];  Service: Plastics;  Laterality: Right;  . INCISION AND DRAINAGE OF WOUND Bilateral 10/20/2016   Procedure: IRRIGATION AND DEBRIDEMENT WOUND BILATERAL;  Surgeon: Edrick Kins, DPM;  Location: [place];  Service: Podiatry;  Laterality: Bilateral;  . IRRIGATION AND DEBRIDEMENT ABSCESS Bilateral 09/26/2013   Procedure: DEBRIDEMENT ULCERS BILATERAL THIGHS;  Surgeon: Gwenyth Ober, MD;  Location: [facility];  Service: General;  Laterality: Bilateral;  . SKIN BIOPSY Bilateral    shin nodules    Social History:  Ambulatory    wheelchair bound,      reports that she has been smoking cigarettes. She has a 4.56 pack-year smoking history. She has never used smokeless tobacco. She reports that she has current or past drug history. Drugs: "Crack" cocaine and Cocaine. She reports that she does not drink alcohol.     Family History:   Family History  Problem Relation Age of Onset  . Breast cancer Mother        Breast cancer  . Alcohol abuse Mother   . Colon cancer Maternal Aunt 80  . Alcohol abuse Father     Allergies: Allergies  Allergen Reactions  . Acetaminophen Swelling and Other (See Comments)    Reaction:  Eyelid swelling  . Lisinopril     UNSPECIFIED REACTION      Prior to Admission medications   Medication Sig Start Date End Date Taking?  Authorizing  Provider  amLODipine (NORVASC) 10 MG tablet Take 1 tablet (10 mg total) by mouth daily. 06/23/18   Doreatha Lew, MD  furosemide (LASIX) 40 MG tablet Take 1 tablet (40 mg total) by mouth daily. 06/23/18   Doreatha Lew, MD  gabapentin (NEURONTIN) 100 MG capsule Take 1 capsule (100 mg total) by mouth 3 (three) times daily. 07/24/18   Quintella Reichert, MD  losartan (COZAAR) 25 MG tablet Take 1 tablet (25 mg total) by mouth daily. 06/23/18   Doreatha Lew, MD  ondansetron (ZOFRAN ODT) 4 MG disintegrating tablet Take 1 tablet (4 mg total) by mouth every 8 (eight) hours as needed for nausea or vomiting. 07/29/18   Montine Circle, PA-C  pantoprazole (PROTONIX) 40 MG tablet Take 1 tablet (40 mg total) by mouth daily. 07/14/18   Amin, Jeanella Flattery, MD  potassium chloride SA (K-DUR,KLOR-CON) 20 MEQ tablet Take 1 tablet (20 mEq total) by mouth daily for 2 days. 07/13/18 07/24/26  Damita Lack, MD   Physical Exam: Blood pressure (!) 182/114, pulse 99, temperature 97.6 F (36.4 C), temperature source Oral, resp. rate 19, height 5' 1"  (1.549 m), weight 46.7 kg, SpO2 100 %. 1. General:  in No Acute distress   Chronically ill disheveled-appearing 2. Psychological: Somnolent but arousable and  Oriented 3. Head/ENT:    Dry Mucous Membranes                          Head Non traumatic, neck supple                          Poor Dentition                           Swollen eyes bilaterally and swollen conjunctiva   4. SKIN:  decreased Skin turgor,  Skin clean Dry excoriations on bilateral stumps with some vesicular rash versus areas of scabbing. Drainage from the right stump noted      5. Heart: Regular rate and rhythm no  Murmur, no Rub or gallop 6. Lungs: Clear to auscultation bilaterally, no wheezes or crackles   7. Abdomen: Soft,  non-tender, Non distended bowel sounds present 8. Lower extremities: no clubbing, cyanosis, bilateral AKA 9. Neurologically Grossly intact, moving all 4  extremities equally   10. MSK: Normal range of motion   LABS:     Recent Labs  Lab 07/24/18 0335 07/28/18 2124 07/30/18 1701  WBC 7.9 9.3 9.0  NEUTROABS  --   --  6.9  HGB 10.7* 11.4* 11.2*  HCT 33.8* 37.7 35.4*  MCV 82.8 87.3 84.1  PLT 253 289 373   Basic Metabolic Panel: Recent Labs  Lab 07/24/18 0335 07/28/18 2211 07/30/18 1701  NA 132* 132* 134*  K 3.8 5.1 4.6  CL 102 99 104  CO2 20* 18* 18*  GLUCOSE 129* 89 115*  BUN 22* 32* 28*  CREATININE 0.79 1.11* 0.80  CALCIUM 9.2 9.2 8.6*      Recent Labs  Lab 07/28/18 2211 07/30/18 1701  AST 49* 195*  ALT 37 145*  ALKPHOS 100 115  BILITOT 3.3* 3.2*  PROT 7.3 6.6  ALBUMIN 3.8 3.4*   Recent Labs  Lab 07/28/18 2211  LIPASE 21   No results for input(s): AMMONIA in the last 168 hours.    HbA1C: No results for input(s): HGBA1C in the last 72 hours. CBG:  No results for input(s): GLUCAP in the last 168 hours.    Urine analysis:    Component Value Date/Time   COLORURINE AMBER (A) 07/28/2018 2124   APPEARANCEUR HAZY (A) 07/28/2018 2124   LABSPEC 1.016 07/28/2018 2124   PHURINE 5.0 07/28/2018 2124   GLUCOSEU NEGATIVE 07/28/2018 2124   HGBUR MODERATE (A) 07/28/2018 2124   BILIRUBINUR NEGATIVE 07/28/2018 2124   BILIRUBINUR small 04/29/2015 [place] 07/28/2018 2124   PROTEINUR 30 (A) 07/28/2018 2124   UROBILINOGEN 1.0 07/26/2017 1550   NITRITE NEGATIVE 07/28/2018 2124   LEUKOCYTESUR NEGATIVE 07/28/2018 2124      Cultures:    Component Value Date/Time   SDES  02/02/2018 1455    BLOOD LEFT ARM Performed at [facility], [place] [address], [place], [place] [address]    SDES  02/02/2018 1455    BLOOD RIGHT HAND Performed at [facility] [address], [place], [place] [address]    [name]  02/02/2018 1455    BOTTLES DRAWN AEROBIC AND ANAEROBIC Blood Culture adequate volume Performed at [facility] [address],  [place], [place] [address]    [place]  02/02/2018 1455    BOTTLES DRAWN AEROBIC AND ANAEROBIC Blood Culture adequate volume Performed at [facility] [address], [place], [place] [address]    CULT  02/02/2018 1455    NO GROWTH 5 DAYS Performed at [facility], [place] [address], [place], [place] [address]    CULT  02/02/2018 1455    NO GROWTH 5 DAYS Performed at [facility], [place] [address] [place], [place] [address]    REPTSTATUS 02/07/2018 FINAL 02/02/2018 1455   REPTSTATUS 02/07/2018 FINAL 02/02/2018 1455     Radiological Exams on Admission: Dg Chest 2 View  Result Date: 07/30/2018 CLINICAL DATA:  Pt BIB EMS found laying on a bench at the bus stop. Pt reports severe pain in both legs. Redness and swelling noted to the sites on both legs. Pt has bilateral AKA amputations. Hx of HTN and community acquired pneumonia. EXAM: CHEST - 2 VIEW COMPARISON:  07/24/2018 and older exams. FINDINGS: Cardiac silhouette is mildly enlarged. No mediastinal or hilar masses. No evidence of adenopathy. Prominent nipple shadow overlies the right mid to lower lung. Mild linear scarring extends laterally from the right hilum, stable. Lungs are otherwise clear. No pleural effusion or pneumothorax. Skeletal structures are intact. IMPRESSION: No acute cardiopulmonary disease. Electronically Signed   By: Lajean Manes M.D.   On: 07/30/2018 18:05   Ct Abdomen Pelvis W Contrast  Result Date: 07/29/2018 CLINICAL DATA:  Abdominal pain, nausea, vomiting and diarrhea beginning this afternoon. History of substance abuse, hernia repair. EXAM: CT ABDOMEN AND PELVIS WITH CONTRAST TECHNIQUE: Multidetector CT imaging of the abdomen and pelvis was performed using the standard protocol following bolus administration of intravenous contrast. CONTRAST:  51m ISOVUE-300 IOPAMIDOL (ISOVUE-300) INJECTION 61% COMPARISON:  CT abdomen and pelvis June 21, 2018 FINDINGS: LOWER CHEST: Lung bases are  clear. Stable cardiomegaly, reflux contrast consistent with RIGHT heart failure. No pericardial effusion. HEPATOBILIARY: Heterogeneous liver with enlarged IVC with contrast reflux consistent with passive hepatic congestion. Mild hepatomegaly. Normal gallbladder. PANCREAS: Too small to characterize hypodensity in spleen. SPLEEN: Normal. ADRENALS/URINARY TRACT: Kidneys are orthotopic, demonstrating symmetric enhancement. Multifocal scarring bilateral kidneys. No nephrolithiasis, hydronephrosis or solid renal masses. The unopacified ureters are normal in course and caliber. Delayed imaging through the kidneys demonstrates symmetric prompt contrast excretion within the  proximal urinary collecting system. Urinary bladder is partially distended and unremarkable. Normal adrenal glands. STOMACH/BOWEL: The stomach, small and large bowel are normal in course and caliber without inflammatory changes. Mildly thickened: Consistent with ascites. Normal appendix. VASCULAR/LYMPHATIC: Aortoiliac vessels are normal in course and caliber. Moderate calcific atherosclerosis. No lymphadenopathy by CT size criteria. REPRODUCTIVE: Normal. OTHER: Moderate ascites decreased from prior examination. No intraperitoneal free air. MUSCULOSKELETAL: Nonacute. Mild anasarca. Old RIGHT L3 and L4 transverse process fractures. RIGHT hip loose bodies. Moderate to severe LEFT hip osteoarthrosis. IMPRESSION: 1. No acute intra-abdominal/pelvic process. 2. Moderate ascites, decreased from prior examination. 3. Stable hepatomegaly with findings of passive hepatic congestion. Aortic Atherosclerosis (ICD10-I70.0). Electronically Signed   By: Elon Alas M.D.   On: 07/29/2018 00:33   Dg Femur Min 2 Views Left  Result Date: 07/30/2018 CLINICAL DATA:  Patient found laying on a bench at a bus stop with pain in the legs. Erythema and swelling noted at site of amputation. EXAM: LEFT FEMUR 2 VIEWS COMPARISON:  None. FINDINGS: Periosteal new bone formation  is noted about the tip of the remaining left femoral diaphysis status post above knee amputation. Findings raise concern for changes of osteomyelitis. Osteoarthritis of the left hip with moderate-to-marked joint space narrowing is identified. No acute fracture. IMPRESSION: Periosteal new bone formation about the tip of the remaining femoral diaphysis is noted. Findings raise concern for osteomyelitis. Electronically Signed   By: Ashley Royalty M.D.   On: 07/30/2018 18:04   Dg Femur Min 2 Views Right  Result Date: 07/30/2018 CLINICAL DATA:  Pt BIB EMS found laying on a bench at the bus stop. Pt reports severe pain in both legs. Redness and swelling noted to the sites on both legs. Pt has bilateral AKA amputations. Hx of HTN and community acquired pneumonia. EXAM: RIGHT FEMUR 2 VIEWS COMPARISON:  None. FINDINGS: Previous amputation across the midshaft of the right femur. Amputated margin appears well defined no focal bone resorption is seen to suggest osteomyelitis. There is no acute fracture. Hip joint is normally spaced and aligned. No soft tissue air. IMPRESSION: 1. No fracture.  No evidence of osteomyelitis.  No acute finding. Electronically Signed   By: Lajean Manes M.D.   On: 07/30/2018 18:02    Chart has been reviewed    Assessment/Plan  57 y.o. female with medical history significant of cocaine abuse, grade 2 diastolic congestive heart failure with ejection fraction 60 to 65%, essential hypertension, peripheral vascular disease status post bilateral AKA Admitted for left AKA stump osteomyelitis  Present on Admission: . Osteomyelitis [facility]) appreciate orthopedics consult we will continue IV antibiotics Zosyn and vancomycin Dr. Sharol Given to see in a.m. to see if patient needs any interventions . Ulcer of amputation stump of lower extremity ([place]) -appreciate orthopedics consult regarding long-term management of chronic poorly healing ulcerations at the site of the stumps . Tobacco abuse once patient is  more alert would need to discuss need for quitting smoking . Protein-calorie malnutrition, severe ([place]) we will need nutritional consult . Essential hypertension -hydralazine as needed and monitor blood pressure status.  Patient has not been compliant to medications at home will need to reassess which medications she can actually take . Cocaine-induced vascular disorder ([place]) spoke about importance of avoiding cocaine . Elevated LFTs we will check hepatitis serologies alcohol level and acetaminophen level and obtain right upper quadrant ultrasound to further evaluate check CK as well  . Acute encephalopathy in the setting of drug abuse and Haldol given in the  emergency department.  Currently appears to be resting comfortably arousable.  Continue to monitor on continuous pulse ox . Eye swelling, bilateral -currently somewhat improving suspicious for allergic reaction suspect secondary to Toradol.  Although Haldol was given simultaneously as well.  For now prescribed Atarax as needed avoid medications this could exacerbate.  If no significant improvement will likely need ophthalmology consult   Other plan as per orders.  DVT prophylaxis:  SCD   Code Status:  FULL CODE     Family Communication:   Family not  at  Bedside   Disposition Plan:  likely will need placement for rehabilitation                    Would benefit from PT/OT eval prior to DC  Ordered                                      Social Work  consulted                   Nutrition    consulted    Consults called: Orthopedics  Admission status:   inpatient     Expect 2 midnight stay secondary to severity of patient's current illness including hemodynamic instability despite optimal treatment (   Hypertension )  Severe radiological abnormalities including osteomyelitis and extensive comorbidities including:  substance abuse     That are currently affecting medical management.  I expect  patient to be hospitalized for 2  midnights requiring inpatient medical care.  Patient is at high risk for adverse outcome (such as loss of life or disability) if not treated.  Indication for inpatient stay as follows:      Probable need for operative intervention Need for IV antibiotics, IV fluids,       Level of care      tele  For  24H          Adriaan Maltese 07/30/2018, 10:13 PM    [facility]  Pager [phone]   after 2 AM please page floor coverage PA If 7AM-7PM, please contact the day team taking care of the patient  [email or web address]  Password [email or web address]

## 2018-07-30 NOTE — Progress Notes (Signed)
Pharmacy Antibiotic Note  RIMSHA TREMBLEY is a 57 y.o. female admitted on 07/30/2018 with RLE pain, possible osteomyelitis .  Pharmacy has been consulted for Vancomycin  Dosing.  Vancomycin 1 g IV given in ED at  2030  Plan: Vancomycin 500 mg IV q12h  Height: 5' 1"  (154.9 cm) Weight: 102 lb 15.3 oz (46.7 kg) IBW/kg (Calculated) : 47.8  Temp (24hrs), Avg:98 F (36.7 C), Min:97.6 F (36.4 C), Max:98.4 F (36.9 C)  Recent Labs  Lab 07/24/18 0335 07/28/18 2124 07/28/18 2211 07/30/18 1701 07/30/18 1752  WBC 7.9 9.3  --  9.0  --   CREATININE 0.79  --  1.11* 0.80  --   LATICACIDVEN  --   --   --   --  1.41    Estimated Creatinine Clearance: 58.6 mL/min (by C-G formula based on SCr of 0.8 mg/dL).    Allergies  Allergen Reactions  . Acetaminophen Swelling and Other (See Comments)    Reaction:  Eyelid swelling  . Haldol [Haloperidol Lactate]     Possible eye swelling was given together with Toradol unsure if caused the reaction  . Lisinopril     UNSPECIFIED REACTION   . Toradol [Ketorolac Tromethamine]     Eye swelling    Caryl Pina 07/30/2018 11:20 PM

## 2018-07-30 NOTE — ED Notes (Signed)
[facility] unable to take pt's wheelchair. Security asking whether they can take it or [facility] can come pick it up. Wheelchair currently sitting in supply room

## 2018-07-30 NOTE — Progress Notes (Signed)
A consult was received from an ED physician for vanc/zosyn per pharmacy dosing.  The patient's profile has been reviewed for ht/wt/allergies/indication/available labs.   A one time order has been placed for vanc 1g and zosyn 3.375g.  Further antibiotics/pharmacy consults should be ordered by admitting physician if indicated.                       Thank you, Meeah, [place] 07/30/2018  7:07 PM

## 2018-07-30 NOTE — ED Notes (Signed)
[facility] contacted to take pt to [facility]

## 2018-07-30 NOTE — ED Notes (Signed)
Resp called about VBG

## 2018-07-30 NOTE — ED Notes (Signed)
[facility] at bedside 

## 2018-07-30 NOTE — ED Notes (Signed)
Pt placed on Purewick to obtain urine sample

## 2018-07-30 NOTE — ED Provider Notes (Signed)
[facility] DEPT Provider Note   CSN: [id number] Arrival date & time: 07/30/18  1555     History   Chief Complaint Chief Complaint  Patient presents with  . Leg Pain    HPI [name] is a 57 y.o. female.  HPI   Presents with concern for bilateral leg pain at site of AKAs, 7-9 months ago had AKAs Woke up this morning with pain, pain is severe, difficult to give quality Also reports coughing up some mucous Not sure if fevers No nausea or vomiting    Past Medical History:  Diagnosis Date  . CAP (community acquired pneumonia) 03/2014 X 2  . Cocaine abuse ([place])    ongoing with resultant vaculitis.  . Depression   . Headache    "weekly" (07/29/2016)  . Hypertension   . Inflammatory arthritis   . Migraines    "probably 5-6/yr" (07/29/2016)  . Normocytic anemia    BL Hgb 9.8-12. Last anemia panel 04/2010 - showing Fe 19, ferritin 101.  Pt on monthly B12 injections  . Rheumatoid arthritis(714.0)    patient reported  . 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 Followed by Dr. Louanne Skye    Patient Active Problem List   Diagnosis Date Noted  . Homelessness 07/30/2018  . Elevated LFTs 07/30/2018  . Osteomyelitis ([place]) 07/30/2018  . Acute encephalopathy 07/30/2018  . Eye swelling, bilateral 07/30/2018  . Skin rash 07/11/2018  . Acute on chronic diastolic CHF (congestive heart failure) ([place]) 06/21/2018  . Malnutrition of moderate degree 05/20/2018  . Acute congestive heart failure ([place])   . Suicidal ideation 02/02/2018  . HCAP (healthcare-associated pneumonia) 02/02/2018  . Acute on chronic respiratory failure with hypoxia ([place]) 02/02/2018  . Sepsis ([place]) 02/02/2018  . Ulcer of amputation stump of lower extremity ([place]) 10/23/2017  . Cocaine abuse with cocaine-induced mood disorder ([place]) 08/10/2017  . Hypertensive crisis   . Phantom limb pain ([place])   . 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 Tobacco abuse   . Post-operative pain   . Acute blood loss anemia   . Atherosclerosis of native arteries of extremities with gangrene, bilateral legs ([place])   . Wound infection 03/30/2017  . AKI (acute kidney injury) ([place]) 02/07/2017  . Acute kidney injury ([place]) 02/06/2017  . Wound healing, delayed   . Chest pain 04/07/2015  . Protein-calorie malnutrition, severe ([place]) 01/15/2015  . MDD (major depressive disorder), recurrent episode, severe ([place]) 10/16/2014  . Cocaine use disorder, severe, dependence ([place]) 10/10/2014  . 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 Essential hypertension 02/26/2010    Past Surgical History:  Procedure Laterality Date  . AMPUTATION Left 05/22/2016   Procedure: AMPUTATION LEFT LONG FINGER;  Surgeon: Marybelle Killings, MD;  Location: [facility];  Service: Orthopedics;  Laterality: Left;  . AMPUTATION Bilateral 04/10/2017   Procedure: AMPUTATION BELOW KNEE;  Surgeon: Newt Minion, MD;  Location: [facility];  Service: Orthopedics;  Laterality: Bilateral;  . AMPUTATION Bilateral 02/06/2018   Procedure: AMPUTATION ABOVE KNEE;  Surgeon: Newt Minion, MD;  Location: [facility];  Service: Orthopedics;  Laterality: Bilateral;  . HERNIA REPAIR     "stomach"  . I&D EXTREMITY Right 09/26/2015   Procedure: IRRIGATION AND DEBRIDEMENT LEG WOUND  VAC PLACEMENT.;  Surgeon: Loel Lofty Dillingham, DO;  Location: [facility];  Service: Plastics;  Laterality: Right;  . INCISION AND DRAINAGE OF WOUND Bilateral 10/20/2016   Procedure:  IRRIGATION AND DEBRIDEMENT WOUND BILATERAL;  Surgeon: Edrick Kins, DPM;  Location: [facility];  Service: Podiatry;  Laterality: Bilateral;  . IRRIGATION AND DEBRIDEMENT ABSCESS Bilateral 09/26/2013   Procedure: DEBRIDEMENT ULCERS BILATERAL THIGHS;  Surgeon: Gwenyth Ober, MD;  Location: [facility];  Service: General;  Laterality: Bilateral;  . SKIN BIOPSY Bilateral    shin nodules     OB History   None      Home Medications    Prior  to Admission medications   Medication Sig Start Date End Date Taking? Authorizing Provider  amLODipine (NORVASC) 10 MG tablet Take 1 tablet (10 mg total) by mouth daily. 06/23/18   Doreatha Lew, MD  furosemide (LASIX) 40 MG tablet Take 1 tablet (40 mg total) by mouth daily. 06/23/18   Doreatha Lew, MD  gabapentin (NEURONTIN) 100 MG capsule Take 1 capsule (100 mg total) by mouth 3 (three) times daily. 07/24/18   Quintella Reichert, MD  losartan (COZAAR) 25 MG tablet Take 1 tablet (25 mg total) by mouth daily. 06/23/18   Doreatha Lew, MD  ondansetron (ZOFRAN ODT) 4 MG disintegrating tablet Take 1 tablet (4 mg total) by mouth every 8 (eight) hours as needed for nausea or vomiting. 07/29/18   Montine Circle, PA-C  pantoprazole (PROTONIX) 40 MG tablet Take 1 tablet (40 mg total) by mouth daily. 07/14/18   Amin, Jeanella Flattery, MD  potassium chloride SA (K-DUR,KLOR-CON) 20 MEQ tablet Take 1 tablet (20 mEq total) by mouth daily for 2 days. 07/13/18 07/24/26  Damita Lack, MD    Family History Family History  Problem Relation Age of Onset  . Breast cancer Mother        Breast cancer  . Alcohol abuse Mother   . Colon cancer Maternal Aunt 37  . Alcohol abuse Father     Social History Social History   Tobacco Use  . Smoking status: Current Every Day Smoker    Packs/day: 0.12    Years: 38.00    Pack years: 4.56    Types: Cigarettes  . Smokeless tobacco: Never Used  . Tobacco comment: 2 A DAY  Substance Use Topics  . Alcohol use: No    Alcohol/week: 0.0 standard drinks  . Drug use: Yes    Types: "Crack" cocaine, Cocaine    Comment: Smoked crack 06/02/2017     Allergies   Acetaminophen; Haldol [haloperidol lactate]; Lisinopril; and Toradol [ketorolac tromethamine]   Review of Systems Review of Systems  Constitutional: Negative for fever.  HENT: Negative for sore throat.   Eyes: Negative for visual disturbance.  Respiratory: Negative for cough and shortness of  breath.   Cardiovascular: Negative for chest pain.  Gastrointestinal: Negative for abdominal pain, nausea and vomiting.  Genitourinary: Negative for difficulty urinating.  Musculoskeletal: Positive for arthralgias. Negative for neck pain.  Skin: Positive for rash and wound.  Neurological: Negative for syncope and headaches.     Physical Exam Updated Vital Signs BP (!) 164/89 (BP Location: Right Arm)   Pulse 100   Temp 98.9 F (37.2 C) (Oral)   Resp 18   Ht 5' 1"  (1.549 m)   Wt 46.7 kg   SpO2 100%   BMI 19.45 kg/m   Physical Exam  Constitutional: She is oriented to person, place, and time. She appears well-developed and well-nourished. She appears distressed (pain, tearful, yelling).  HENT:  Head: Normocephalic and atraumatic.  Eyes: Conjunctivae and EOM are normal.  Neck: Normal range of motion.  Cardiovascular: Normal  rate, regular rhythm, normal heart sounds and intact distal pulses. Exam reveals no gallop and no friction rub.  No murmur heard. Pulmonary/Chest: Effort normal and breath sounds normal. No respiratory distress. She has no wheezes. She has no rales.  Abdominal: Soft. She exhibits no distension. There is no tenderness. There is no guarding.  Musculoskeletal: She exhibits no edema or tenderness.  Bilateral AKAs, left with mild erythema to tip of stump, right small area of bleeding Petechial rash right stump with excoriations, small ulcers   Neurological: She is alert and oriented to person, place, and time.  Skin: Skin is warm and dry. No rash noted. She is not diaphoretic. No erythema.  Nursing note and vitals reviewed.    ED Treatments / Results  Labs (all labs ordered are listed, but only abnormal results are displayed) Labs Reviewed  CBC WITH DIFFERENTIAL/PLATELET - Abnormal; Notable for the following components:      Result Value   Hemoglobin 11.2 (*)    HCT 35.4 (*)    RDW 20.2 (*)    All other components within normal limits  COMPREHENSIVE  METABOLIC PANEL - Abnormal; Notable for the following components:   Sodium 134 (*)    CO2 18 (*)    Glucose, Bld 115 (*)    BUN 28 (*)    Calcium 8.6 (*)    Albumin 3.4 (*)    AST 195 (*)    ALT 145 (*)    Total Bilirubin 3.2 (*)    All other components within normal limits  BLOOD GAS, VENOUS - Abnormal; Notable for the following components:   pCO2, Ven 35.0 (*)    pO2, Ven 99.6 (*)    Bicarbonate 19.6 (*)    Acid-base deficit 4.6 (*)    All other components within normal limits  PHOSPHORUS - Abnormal; Notable for the following components:   Phosphorus 2.1 (*)    All other components within normal limits  COMPREHENSIVE METABOLIC PANEL - Abnormal; Notable for the following components:   Sodium 134 (*)    CO2 20 (*)    Glucose, Bld 104 (*)    Calcium 8.6 (*)    Total Protein 5.8 (*)    Albumin 2.8 (*)    AST 198 (*)    ALT 160 (*)    Total Bilirubin 2.6 (*)    All other components within normal limits  CBC - Abnormal; Notable for the following components:   WBC 11.3 (*)    Hemoglobin 10.5 (*)    HCT 33.8 (*)    RDW 20.0 (*)    All other components within normal limits  RAPID URINE DRUG SCREEN, HOSP PERFORMED - Abnormal; Notable for the following components:   Opiates POSITIVE (*)    Cocaine POSITIVE (*)    All other components within normal limits  MRSA PCR SCREENING  CULTURE, BLOOD (ROUTINE X 2)  CULTURE, BLOOD (ROUTINE X 2)  URINE CULTURE  MAGNESIUM  TSH  PROTIME-INR  ETHANOL  CK  TROPONIN I  TROPONIN I  TROPONIN I  HEPATITIS PANEL, ACUTE  HIV-1 RNA QUANT-NO REFLEX-BLD  URINALYSIS, ROUTINE W REFLEX MICROSCOPIC  ACETAMINOPHEN LEVEL  I-STAT CG4 LACTIC ACID, ED    EKG None  Radiology Dg Chest 2 View  Result Date: 07/30/2018 CLINICAL DATA:  Pt BIB EMS found laying [place] bench at the bus stop. Pt reports severe pain in both legs. Redness and swelling noted to the sites on both legs. Pt has bilateral AKA amputations. Hx of  HTN and community acquired  pneumonia. EXAM: CHEST - 2 VIEW COMPARISON:  07/24/2018 and older exams. FINDINGS: Cardiac silhouette is mildly enlarged. No mediastinal or hilar masses. No evidence of adenopathy. Prominent nipple shadow overlies the right mid to lower lung. Mild linear scarring extends laterally from the right hilum, stable. Lungs are otherwise clear. No pleural effusion or pneumothorax. Skeletal structures are intact. IMPRESSION: No acute cardiopulmonary disease. Electronically Signed   By: Lajean Manes M.D.   On: 07/30/2018 18:05   Dg Femur Min 2 Views Left  Result Date: 07/30/2018 CLINICAL DATA:  Patient found laying on a bench at a bus stop with pain in the legs. Erythema and swelling noted at site of amputation. EXAM: LEFT FEMUR 2 VIEWS COMPARISON:  None. FINDINGS: Periosteal new bone formation is noted about the tip of the remaining left femoral diaphysis status post above knee amputation. Findings raise concern for changes of osteomyelitis. Osteoarthritis of the left hip with moderate-to-marked joint space narrowing is identified. No acute fracture. IMPRESSION: Periosteal new bone formation about the tip of the remaining femoral diaphysis is noted. Findings raise concern for osteomyelitis. Electronically Signed   By: Ashley Royalty M.D.   On: 07/30/2018 18:04   Dg Femur Min 2 Views Right  Result Date: 07/30/2018 CLINICAL DATA:  Pt BIB EMS found laying on a bench at the bus stop. Pt reports severe pain in both legs. Redness and swelling noted to the sites on both legs. Pt has bilateral AKA amputations. Hx of HTN and community acquired pneumonia. EXAM: RIGHT FEMUR 2 VIEWS COMPARISON:  None. FINDINGS: Previous amputation across the midshaft of the right femur. Amputated margin appears well defined no focal bone resorption is seen to suggest osteomyelitis. There is no acute fracture. Hip joint is normally spaced and aligned. No soft tissue air. IMPRESSION: 1. No fracture.  No evidence of osteomyelitis.  No acute  finding. Electronically Signed   By: Lajean Manes M.D.   On: 07/30/2018 18:02   US Abdomen Limited Ruq  Result Date: 07/30/2018 CLINICAL DATA:  Elevated liver enzymes EXAM: ULTRASOUND ABDOMEN LIMITED RIGHT UPPER QUADRANT COMPARISON:  July 29, 2018 CT abdomen and pelvis FINDINGS: Gallbladder: No gallstones are evident. Gallbladder wall is slightly thickened, a finding that may be due to ascites which is present. There is no appreciable pericholecystic fluid. No sonographic [name] sign noted by sonographer. Common bile duct: Diameter: 4 mm. No intrahepatic or extrahepatic biliary duct dilatation. Liver: No focal lesion identified. Within normal limits in parenchymal echogenicity. Portal vein is patent on color Doppler imaging with normal direction of blood flow towards the liver. Ascites noted. IMPRESSION: 1.  Relatively mild ascites noted. 2. Gallbladder wall slightly thickened. This is a finding that may be secondary to ascites. No gallstones or pericholecystic fluid seen. It should be noted that acalculus cholecystitis could present in this manner. In this regard, it may be prudent to correlate with nuclear medicine hepatobiliary imaging study to assess for cystic duct patency. 3. No focal liver lesions are appreciable. The changes suggesting chronic passive congestion on recent CT within the liver are not demonstrated by ultrasound. Electronically Signed   By: Lowella Grip III M.D.   On: 07/30/2018 21:52    Procedures Procedures (including critical care time)  Medications Ordered in ED Medications  hydrOXYzine (ATARAX/VISTARIL) tablet 10 mg (has no administration in time range)  piperacillin-tazobactam (ZOSYN) IVPB 3.375 g (3.375 g Intravenous New Bag/Given 07/31/18 0358)  gabapentin (NEURONTIN) capsule 100 mg (100 mg Oral Given 07/31/18  1046)  ondansetron (ZOFRAN) tablet 4 mg (has no administration in time range)    Or  ondansetron (ZOFRAN) injection 4 mg (has no administration in time  range)  0.9 %  sodium chloride infusion ( Intravenous New Bag/Given 07/31/18 0020)  hydrALAZINE (APRESOLINE) injection 10 mg (has no administration in time range)  vancomycin (VANCOCIN) 500 mg in sodium chloride 0.9 % 100 mL IVPB (500 mg Intravenous New Bag/Given 07/31/18 0633)  sodium chloride 0.9 % bolus 1,000 mL (0 mLs Intravenous Stopped 07/30/18 2212)  ketorolac (TORADOL) 30 MG/ML injection 30 mg (30 mg Intravenous Given 07/30/18 1751)  haloperidol lactate (HALDOL) injection 2 mg (2 mg Intravenous Given 07/30/18 1754)  vancomycin (VANCOCIN) IVPB 1000 mg/200 mL premix (0 mg Intravenous Stopped 07/30/18 2136)  piperacillin-tazobactam (ZOSYN) IVPB 3.375 g (0 g Intravenous Stopped 07/30/18 2034)     Initial Impression / Assessment and Plan / ED Course  I have reviewed the triage vital signs and the nursing notes.  Pertinent labs & imaging results that were available during my care of the patient were reviewed by me and considered in my medical decision making (see chart for details).     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 On arrival very anxious, tearful, yelling out in pain. Given 38m haldol and toradol.  On reevaluation, she was noted to have periorbital edema and chemosis. Had mild lid edema in setting of being tearful on arrival, possible allergic reaction.  XR shows concern for new bone growth, possible osteomyelitis.  Discussed with Dr. BNinfa Lindenwho recommends [facility], obtain further imaging. Pt without signs of sepsis but given her social situation, unclear acuity of possible osteo, will admit for IV abx.    Final Clinical Impressions(s) / ED Diagnoses   Final diagnoses:  Osteomyelitis of left femur, unspecified type ([place]    Vasculitis ([facility]    ED Discharge Orders    None       SGareth Morgan MD 07/31/18 1202

## 2018-07-30 NOTE — ED Notes (Signed)
Patient transported to X-ray 

## 2018-07-30 NOTE — ED Triage Notes (Signed)
Pt BIB EMS found laying on a bench at the bus stop. Pt reports severe pain in both legs. Redness and swelling noted to the sites on both legs. Pt has bilateral AKA amputations.   22LW 13mg Fentanyl  170/100 HR 102 CBG 144 RR 22

## 2018-07-30 NOTE — ED Notes (Signed)
Bed: WA12 Expected date:  Expected time:  Means of arrival:  Comments: 

## 2018-07-31 LAB — CBC
HCT: 33.8 % — ABNORMAL LOW (ref 36.0–46.0)
Hemoglobin: 10.5 g/dL — ABNORMAL LOW (ref 12.0–15.0)
MCH: 26.1 pg (ref 26.0–34.0)
MCHC: 31.1 g/dL (ref 30.0–36.0)
MCV: 83.9 fL (ref 80.0–100.0)
NRBC: 0 % (ref 0.0–0.2)
PLATELETS: 268 10*3/uL (ref 150–400)
RBC: 4.03 MIL/uL (ref 3.87–5.11)
RDW: 20 % — ABNORMAL HIGH (ref 11.5–15.5)
WBC: 11.3 10*3/uL — ABNORMAL HIGH (ref 4.0–10.5)

## 2018-07-31 LAB — RAPID URINE DRUG SCREEN, HOSP PERFORMED
Amphetamines: NOT DETECTED
BENZODIAZEPINES: NOT DETECTED
Barbiturates: NOT DETECTED
COCAINE: POSITIVE — AB
Opiates: POSITIVE — AB
Tetrahydrocannabinol: NOT DETECTED

## 2018-07-31 LAB — MAGNESIUM: MAGNESIUM: 1.7 mg/dL (ref 1.7–2.4)

## 2018-07-31 LAB — COMPREHENSIVE METABOLIC PANEL
ALK PHOS: 107 U/L (ref 38–126)
ALT: 160 U/L — ABNORMAL HIGH (ref 0–44)
ANION GAP: 11 (ref 5–15)
AST: 198 U/L — ABNORMAL HIGH (ref 15–41)
Albumin: 2.8 g/dL — ABNORMAL LOW (ref 3.5–5.0)
BILIRUBIN TOTAL: 2.6 mg/dL — AB (ref 0.3–1.2)
BUN: 19 mg/dL (ref 6–20)
CALCIUM: 8.6 mg/dL — AB (ref 8.9–10.3)
CO2: 20 mmol/L — AB (ref 22–32)
Chloride: 103 mmol/L (ref 98–111)
Creatinine, Ser: 0.86 mg/dL (ref 0.44–1.00)
GFR calc non Af Amer: >60 mL/min (ref >60)
Glucose, Bld: 104 mg/dL — ABNORMAL HIGH (ref 70–99)
Potassium: 3.7 mmol/L (ref 3.5–5.1)
SODIUM: 134 mmol/L — AB (ref 135–145)
TOTAL PROTEIN: 5.8 g/dL — AB (ref 6.5–8.1)

## 2018-07-31 LAB — MRSA PCR SCREENING: MRSA BY PCR: NEGATIVE

## 2018-07-31 LAB — PHOSPHORUS: PHOSPHORUS: 2.1 mg/dL — AB (ref 2.5–4.6)

## 2018-07-31 LAB — TSH: TSH: 1.65 u[IU]/mL (ref 0.350–4.500)

## 2018-07-31 NOTE — Progress Notes (Signed)
PT Cancellation Note  Patient Details Name: [name] MRN: [id number] DOB: [birth year]   Cancelled Evaluation:    Reason Eval/Treat Not Completed: Fatigue/lethargy limiting ability to participate;Patient declined, stating she was too tired. She would drift off asleep as she was mid sentence talking with me.    Melvern Banker 07/31/2018, 2:46 PM  Lavonia Dana, PT   Acute Rehabilitation Services  Pager [phone] Office [phone] 07/31/2018

## 2018-07-31 NOTE — Progress Notes (Signed)
PROGRESS NOTE    [name]  URK:[id number] DOB: [birth year] DOA: 07/30/2018 PCP: Azzie Glatter, FNP  Outpatient Specialists:     Brief Narrative:  [name] is a 56 y.o. female with medical history significant of cocaine abuse, grade 2 diastolic congestive heart failure with ejection fraction 60 to 65%, essential hypertension, peripheral vascular disease status post bilateral AKA. She is homeless and continues to abuse cocaine,and presented with AKA stump pain, and redness, with x-ray femur concerning for osteomyelitis. 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  Currently appears to be resting comfortably Continuous pulse oximetry Supportive Care  Eye swelling, bilateral  Improving  Suspicious for allergic reaction suspect secondary to Toradol.  Prn Atarax for now, and If no significant improvement would consider ophthalmology consult  Elevated LFTs  F/u  hepatitis serologies  Consider RUQ  ultrasound if worsening   DVT prophylaxis: (SCD's) Code Status: (Full) Family Communication:   Disposition Plan: TBD   Consultants:  orthopedist  Procedures:   Antimicrobials:  Vancomycin and Zosyn   Subjective: No fever or chills  Objective: Vitals:   07/30/18 2009 07/30/18 2234 07/30/18 2314 07/31/18 0525  BP: (!) 168/111 (!) 144/104 (!) 166/115 (!) 164/89  Pulse: 92 87 92 100  Resp: 19 18 17 18   Temp:   98.4 F (36.9 C) 98.9 F (37.2 C)  TempSrc:   Oral Oral  SpO2: 97% 100% 100% 100%  Weight:      Height:        Intake/Output Summary (Last 24 hours) at 07/31/2018 1143 Last data filed at 07/30/2018 2314 Gross per 24 hour  Intake -  Output 500 ml  Net -500 ml   Filed Weights   07/30/18 1609  Weight: 46.7 kg    Examination:  General exam: NAD, comfortable  Respiratory system: Clear to auscultation. Respiratory effort normal. Cardiovascular system: S1 & S2 heard, RRR. No JVD, murmurs, rubs, gallops or clicks. No pedal edema. Gastrointestinal system: Abdomen is nondistended, soft and nontender. No organomegaly or masses felt. Normal bowel sounds heard. Central nervous system: Sedated, drowsy but easily arousable, moves all extremities, no obvious focal deficit Extremities: Symmetric 5 x 5 power. Skin: redness, scabbing, warmth to touch of AKA stumps .     Data Reviewed: I have personally reviewed following labs and imaging studies  CBC: Recent Labs  Lab 07/28/18 2124 07/30/18 1701 07/31/18 0348  WBC 9.3 9.0 11.3*  NEUTROABS  --  6.9  --   HGB 11.4* 11.2* 10.5*  HCT 37.7 35.4* 33.8*  MCV 87.3  84.1 83.9  PLT 289 297 867   Basic Metabolic Panel: Recent Labs  Lab 07/28/18 2211 07/30/18 1701 07/31/18 0348  NA 132* 134* 134*  K 5.1 4.6 3.7  CL 99 104 103  CO2 18* 18* 20*  GLUCOSE 89 115* 104*  BUN 32* 28* 19  CREATININE 1.11* 0.80 0.86  CALCIUM 9.2 8.6* 8.6*  MG  --   --  1.7  PHOS  --   --  2.1*   GFR: Estimated Creatinine  Clearance: 54.5 mL/min (by C-G formula based on SCr of 0.86 mg/dL). Liver Function Tests: Recent Labs  Lab 07/28/18 2211 07/30/18 1701 07/31/18 0348  AST 49* 195* 198*  ALT 37 145* 160*  ALKPHOS 100 115 107  BILITOT 3.3* 3.2* 2.6*  PROT 7.3 6.6 5.8*  ALBUMIN 3.8 3.4* 2.8*   Recent Labs  Lab 07/28/18 2211  LIPASE 21   No results for input(s): AMMONIA in the last 168 hours. Coagulation Profile: No results for input(s): INR, PROTIME in the last 168 hours. Cardiac Enzymes: No results for input(s): CKTOTAL, CKMB, CKMBINDEX, TROPONINI in the last 168 hours. BNP (last 3 results) No results for input(s): PROBNP in the last 8760 hours. HbA1C: No results for input(s): HGBA1C in the last 72 hours. CBG: No results for input(s): GLUCAP in the last 168 hours. Lipid Profile: No results for input(s): CHOL, HDL, LDLCALC, TRIG, CHOLHDL, LDLDIRECT in the last 72 hours. Thyroid Function Tests: Recent Labs    07/31/18 0348  TSH 1.650   Anemia Panel: No results for input(s): VITAMINB12, FOLATE, FERRITIN, TIBC, IRON, RETICCTPCT in the last 72 hours. Urine analysis:    Component Value Date/Time   COLORURINE AMBER (A) 07/28/2018 2124   APPEARANCEUR HAZY (A) 07/28/2018 2124   LABSPEC 1.016 07/28/2018 2124   PHURINE 5.0 07/28/2018 2124   GLUCOSEU NEGATIVE 07/28/2018 2124   HGBUR MODERATE (A) 07/28/2018 2124   BILIRUBINUR NEGATIVE 07/28/2018 2124   BILIRUBINUR small 04/29/2015 [place] 07/28/2018 2124   PROTEINUR 30 (A) 07/28/2018 2124   UROBILINOGEN 1.0 07/26/2017 1550   NITRITE NEGATIVE 07/28/2018 2124   LEUKOCYTESUR NEGATIVE 07/28/2018 2124   Sepsis Labs: @LABRCNTIP (procalcitonin:4,lacticidven:4)  ) Recent Results (from the past 240 hour(s))  MRSA PCR Screening     Status: None   Collection Time: 07/30/18 11:31 PM  Result Value Ref Range Status   MRSA by PCR NEGATIVE NEGATIVE Final    Comment:        The GeneXpert MRSA Assay (FDA approved for NASAL  specimens only), is one component of a comprehensive MRSA colonization surveillance program. It is not intended to diagnose MRSA infection nor to guide or monitor treatment for MRSA infections. Performed at [facility], [place] [address] [place], [place] [address]          Radiology Studies: Dg Chest 2 View  Result Date: 07/30/2018 CLINICAL DATA:  Pt BIB EMS found laying [place] bench at [place] bus stop. Pt reports severe pain in both legs. Redness and swelling noted to the sites on both legs. Pt has bilateral AKA amputations. Hx of HTN and community acquired pneumonia. EXAM: CHEST - 2 VIEW COMPARISON:  07/24/2018 and older exams. FINDINGS: Cardiac silhouette is mildly enlarged. No mediastinal or hilar masses. No evidence of adenopathy. Prominent nipple shadow overlies the right mid to lower lung. Mild linear scarring extends laterally from the right hilum, stable. Lungs are otherwise clear. No pleural effusion or pneumothorax. Skeletal structures are intact. IMPRESSION: No acute cardiopulmonary  disease. Electronically Signed   By: Lajean Manes M.D.   On: 07/30/2018 18:05   Dg Femur Min 2 Views Left  Result Date: 07/30/2018 CLINICAL DATA:  Patient found laying on a bench at a bus stop with pain in the legs. Erythema and swelling noted at site of amputation. EXAM: LEFT FEMUR 2 VIEWS COMPARISON:  None. FINDINGS: Periosteal new bone formation is noted about the tip of the remaining left femoral diaphysis status post above knee amputation. Findings raise concern for changes of osteomyelitis. Osteoarthritis of the left hip with moderate-to-marked joint space narrowing is identified. No acute fracture. IMPRESSION: Periosteal new bone formation about the tip of the remaining femoral diaphysis is noted. Findings raise concern for osteomyelitis. Electronically Signed   By: Ashley Royalty M.D.   On: 07/30/2018 18:04   Dg Femur Min 2 Views Right  Result Date: 07/30/2018 CLINICAL DATA:  Pt BIB EMS  found laying on a bench at the bus stop. Pt reports severe pain in both legs. Redness and swelling noted to the sites on both legs. Pt has bilateral AKA amputations. Hx of HTN and community acquired pneumonia. EXAM: RIGHT FEMUR 2 VIEWS COMPARISON:  None. FINDINGS: Previous amputation across the midshaft of the right femur. Amputated margin appears well defined no focal bone resorption is seen to suggest osteomyelitis. There is no acute fracture. Hip joint is normally spaced and aligned. No soft tissue air. IMPRESSION: 1. No fracture.  No evidence of osteomyelitis.  No acute finding. Electronically Signed   By: Lajean Manes M.D.   On: 07/30/2018 18:02   US Abdomen Limited Ruq  Result Date: 07/30/2018 CLINICAL DATA:  Elevated liver enzymes EXAM: ULTRASOUND ABDOMEN LIMITED RIGHT UPPER QUADRANT COMPARISON:  July 29, 2018 CT abdomen and pelvis FINDINGS: Gallbladder: No gallstones are evident. Gallbladder wall is slightly thickened, a finding that may be due to ascites which is present. There is no appreciable pericholecystic fluid. No sonographic [name] sign noted by sonographer. Common bile duct: Diameter: 4 mm. No intrahepatic or extrahepatic biliary duct dilatation. Liver: No focal lesion identified. Within normal limits in parenchymal echogenicity. Portal vein is patent on color Doppler imaging with normal direction of blood flow towards the liver. Ascites noted. IMPRESSION: 1.  Relatively mild ascites noted. 2. Gallbladder wall slightly thickened. This is a finding that may be secondary to ascites. No gallstones or pericholecystic fluid seen. It should be noted that acalculus cholecystitis could present in this manner. In this regard, it may be prudent to correlate with nuclear medicine hepatobiliary imaging study to assess for cystic duct patency. 3. No focal liver lesions are appreciable. The changes suggesting chronic passive congestion on recent CT within the liver are not demonstrated by ultrasound.  Electronically Signed   By: Lowella Grip III M.D.   On: 07/30/2018 21:52        Scheduled Meds: . gabapentin  100 mg Oral TID   Continuous Infusions: . piperacillin-tazobactam 3.375 g (07/31/18 0358)  . vancomycin 500 mg (07/31/18 6286)     LOS: 1 day    Time spent:35 mins    Benito Mccreedy, MD [facility] Pager [phone]  If 7PM-7AM, please contact night-coverage [email or web address] Password [email or web address] 07/31/2018, 11:43 AM

## 2018-08-01 ENCOUNTER — Inpatient Hospital Stay (HOSPITAL_COMMUNITY): Payer: Medicaid Other

## 2018-08-01 DIAGNOSIS — Z89612 Acquired absence of left leg above knee: Secondary | ICD-10-CM

## 2018-08-01 DIAGNOSIS — T8789 Other complications of amputation stump: Principal | ICD-10-CM

## 2018-08-01 DIAGNOSIS — E43 Unspecified severe protein-calorie malnutrition: Secondary | ICD-10-CM

## 2018-08-01 DIAGNOSIS — L97111 Non-pressure chronic ulcer of right thigh limited to breakdown of skin: Secondary | ICD-10-CM

## 2018-08-01 DIAGNOSIS — M869 Osteomyelitis, unspecified: Secondary | ICD-10-CM

## 2018-08-01 DIAGNOSIS — R945 Abnormal results of liver function studies: Secondary | ICD-10-CM

## 2018-08-01 DIAGNOSIS — G9340 Encephalopathy, unspecified: Secondary | ICD-10-CM

## 2018-08-01 DIAGNOSIS — L97909 Non-pressure chronic ulcer of unspecified part of unspecified lower leg with unspecified severity: Secondary | ICD-10-CM

## 2018-08-01 DIAGNOSIS — Z89611 Acquired absence of right leg above knee: Secondary | ICD-10-CM

## 2018-08-01 MED ORDER — TECHNETIUM TC 99M MEBROFENIN IV KIT
5.0000 | PACK | Freq: Once | INTRAVENOUS | Status: AC | PRN
  Administered 2018-08-01: 5 via INTRAVENOUS

## 2018-08-01 MED ORDER — AMLODIPINE BESYLATE 10 MG PO TABS
10.0000 mg | ORAL_TABLET | Freq: Every day | ORAL | Status: DC
  Administered 2018-08-02: 10 mg via ORAL
  Filled 2018-08-01: qty 1

## 2018-08-01 MED ORDER — ADULT MULTIVITAMIN W/MINERALS CH
1.0000 | ORAL_TABLET | Freq: Every day | ORAL | Status: DC
  Administered 2018-08-02: 1 via ORAL
  Filled 2018-08-01: qty 1

## 2018-08-01 MED ORDER — ENSURE ENLIVE PO LIQD
237.0000 mL | Freq: Three times a day (TID) | ORAL | Status: DC
  Administered 2018-08-01 – 2018-08-02 (×2): 237 mL via ORAL

## 2018-08-01 MED ORDER — OXYCODONE HCL 5 MG PO TABS
5.0000 mg | ORAL_TABLET | Freq: Once | ORAL | Status: AC
  Administered 2018-08-01: 5 mg via ORAL
  Filled 2018-08-01: qty 1

## 2018-08-01 NOTE — Progress Notes (Signed)
OT Cancellation Note  Patient Details Name: [name] MRN: [id number] DOB: [birth year]   Cancelled Treatment:    Reason Eval/Treat Not Completed: Other (comment)(Upon arrival, pt began crying stating "I am so hungry". Pt planning for sx later today. Pt declining therapy stating "I just want to lay here. I can't have any food and I am so hungry." RN notified. Will return as schedule allows.)  [place], OTR/L Acute Rehab Pager: [phone] Office: [phone]  08/01/2018, 9:42 AM

## 2018-08-01 NOTE — Progress Notes (Signed)
Initial Nutrition Assessment  DOCUMENTATION CODES:   Non-severe (moderate) malnutrition in context of social or environmental circumstances  INTERVENTION:    When diet advanced, start Ensure Enlive po TID, each supplement provides 350 kcal and 20 grams of protein  Multivitamin daily  NUTRITION DIAGNOSIS:   Moderate Malnutrition((ongoing)) related to social / environmental circumstances(limited access to food r/t homelessness) as evidenced by moderate fat depletion, mild muscle depletion, energy intake < or equal to 75% for > or equal to 1 month.  GOAL:   Patient will meet greater than or equal to 90% of their needs  MONITOR:   PO intake, Supplement acceptance, Skin  REASON FOR ASSESSMENT:   Consult Malnutrition Eval  ASSESSMENT:   57 yo homeless female with PMH of cocaine abuse, HTN, CHF, anemia, arthritis, severe malnutrition, PVD, and bilateral AKA who was admitted on 10/26 with osteomyelitis of left AKA stump.  Surgical consult this morning. Patient will not require surgery on her leg stump. She is currently NPO for HIDA scan this afternoon.  During RD visit, patient was crying because she is hungry and hasn't been allowed to eat since admission. RD could not obtain any nutrition hx from her. Patient would not let RD complete nutrition focused physical exam.  From recent admission 2-3 weeks ago, patient was identified to have moderate malnutrition r/t social or environmental circumstances. This is an ongoing diagnosis.   Labs reviewed. Sodium 134 (L), phosphorus 2.1 (L) UDS positive for opiates and cocaine. Medications reviewed and include vancomycin.    NUTRITION - FOCUSED PHYSICAL EXAM:  unable to complete NFPE at this time  NFPE 2 weeks ago showed mild-moderate muscle depletion, mild-moderate fat depletion.  Diet Order:   Diet Order            Diet NPO time specified Except for: Sips with Meds  Diet effective midnight              EDUCATION NEEDS:    No education needs have been identified at this time  Skin:  Skin Assessment: Skin Integrity Issues:(L AKA stump osteomyelitis)  Last BM:  none documented since admission  Height:   Ht Readings from Last 1 Encounters:  07/30/18 5' 1"  (1.549 m)    Weight:   Wt Readings from Last 1 Encounters:  07/30/18 46.7 kg    Ideal Body Weight:  40 kg  BMI:  Body mass index is 19.45 kg/m.  Estimated Nutritional Needs:   Kcal:  1400-1600  Protein:  60-75 gm  Fluid:  1.5 L    Molli Barrows, RD, LDN, [place] Pager [phone] After Hours Pager [phone]

## 2018-08-01 NOTE — Progress Notes (Signed)
Palliative Medicine consult noted. Due to high referral volume, there may be a delay seeing this patient. Please call the Palliative Medicine Team office at [phone] if recommendations are needed in the interim.  Thank you for inviting [place] to see this patient.  Marjie Skiff Mardi Cannady, RN, BSN, [facility] Palliative Medicine Team 08/01/2018 3:14 PM Office [phone]

## 2018-08-01 NOTE — Progress Notes (Signed)
[name], female   DOB: [birth year], 57 y.o.   MRN: [id number]  PROGRESS NOTE    [name]  XNA:[id number] DOB: [birth year] DOA: 07/30/2018 PCP: Azzie Glatter, FNP   Brief Narrative:  57 year old female with history of cocaine abuse, grade 2 diastolic congestive heart failure with EF of 60 to 65%, essential hypertension, peripheral vascular disease status post bilateral AKA, homeless presented with AKA stump pain and redness with x-ray femur concerning for osteomyelitis.  Patient was started on broad-spectrum antibiotics.   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 Sharol Given about the patient will see the patient in consultation.  Keep n.p.o. for now  Severe protein calorie malnutrition -Follow nutrition recommendations  Noncompliance to medications and follow-up -Patient is very noncompliant and still using cocaine.  Will request palliative care consultation  Essential hypertension -Blood pressure still intermittently elevated.  On PRN IV hydralazine -We will add amlodipine.  Monitor avoid beta-blockers because of cocaine use  Cocaine abuse -Counseled about cessation.  Social worker consult next  Toxic encephalopathy -Probably secondary to cocaine abuse.  Monitor mental status.  Improving  Elevated LFTs -Slightly worsening but improving bilirubin -Right upper quadrant ultrasound was inconclusive and could not rule out acalculous cholecystitis and recommend HIDA scan.  Will order HIDA scan -Repeat a.m. LFTs.  Follow hepatitis profile   DVT prophylaxis: We will start Lovenox if no  surgical intervention Code Status: Full Family Communication: None at bedside Disposition Plan: Depends on clinical outcome  Consultants: Notified orthopedics  Procedures: None  Antimicrobials: Vancomycin and Zosyn from 07/30/2018 onwards   Subjective: Patient seen and examined at bedside.  She is sleepy, wakes up slightly on calling her name, answers some questions but is a poor historian.  No overnight fever, nausea or vomiting.  Objective: Vitals:   07/31/18 0525 07/31/18 1931 08/01/18 0421 08/01/18 0824  BP: (!) 164/89 (!) 172/98 (!) 147/79 (!) 138/91  Pulse: 100 (!) 105 90 89  Resp: 18 20 17 18   Temp: 98.9 F (37.2 C) 99.9 F (37.7 C) 98.7 F (37.1 C) 99.3 F (37.4 C)  TempSrc: Oral Axillary Oral Oral  SpO2: 100% 100% 100% 100%  Weight:      Height:        Intake/Output Summary (Last 24 hours) at 08/01/2018 1112 Last data filed at 08/01/2018 0600 Gross per 24 hour  Intake 2881.19 ml  Output 1300 ml  Net 1581.19 ml   Filed Weights   07/30/18 1609  Weight: 46.7 kg    Examination:  General exam: Appears calm and comfortable.  Looks older than stated age.  Poor historian Respiratory system: Bilateral decreased breath sounds at bases Cardiovascular system: S1 & S2 heard, Rate controlled Gastrointestinal system: Abdomen is nondistended, soft and nontender. Normal bowel sounds heard. Extremities: No cyanosis, edema; bilateral AKA with some mild redness and ulceration of the left AKA stump  Data Reviewed: I have personally reviewed following labs and imaging studies  CBC: Recent Labs  Lab 07/28/18 2124 07/30/18 1701 07/31/18 0348  WBC 9.3 9.0 11.3*  NEUTROABS  --  6.9  --   HGB 11.4* 11.2* 10.5*  HCT 37.7 35.4* 33.8*  MCV 87.3 84.1 83.9  PLT 289 297 962   Basic Metabolic Panel: Recent Labs  Lab 07/28/18 2211 07/30/18 1701 07/31/18 0348  NA 132* 134* 134*  K 5.1 4.6 3.7  CL 99 104 103  CO2 18* 18* 20*  GLUCOSE 89 115* 104*  BUN 32* 28* 19    CREATININE 1.11* 0.80 0.86  CALCIUM 9.2 8.6* 8.6*  MG  --   --  1.7  PHOS  --   --  2.1*   GFR: Estimated Creatinine Clearance: 54.5 mL/min (by C-G formula based on SCr of 0.86 mg/dL). Liver Function Tests: Recent Labs  Lab 07/28/18 2211 07/30/18 1701 07/31/18 0348  AST 49* 195* 198*  ALT 37 145* 160*  ALKPHOS 100 115 107  BILITOT 3.3* 3.2* 2.6*  PROT 7.3 6.6 5.8*  ALBUMIN 3.8 3.4* 2.8*   Recent Labs  Lab 07/28/18 2211  LIPASE 21   No results for input(s): AMMONIA in the last 168 hours. Coagulation Profile: No results for input(s): INR, PROTIME in the last 168 hours. Cardiac Enzymes: No results for input(s): CKTOTAL, CKMB, CKMBINDEX, TROPONINI in the last 168 hours. BNP (last 3 results) No results for input(s): PROBNP in the last 8760 hours. HbA1C: No results for input(s): HGBA1C in the last 72 hours. CBG: No results for input(s): GLUCAP in the last 168 hours. Lipid Profile: No results for input(s): CHOL, HDL, LDLCALC, TRIG, CHOLHDL, LDLDIRECT in the last 72 hours. Thyroid Function Tests: Recent Labs    07/31/18 0348  TSH 1.650   Anemia Panel: No results for input(s): VITAMINB12, FOLATE, FERRITIN, TIBC, IRON, RETICCTPCT in the last 72 hours. Sepsis Labs: Recent Labs  Lab 07/30/18 1752  LATICACIDVEN 1.41    Recent Results (from the past 240 hour(s))  MRSA PCR Screening     Status: None   Collection Time: 07/30/18 11:31 PM  Result Value Ref Range Status   MRSA by PCR NEGATIVE NEGATIVE Final    Comment:        The GeneXpert MRSA Assay (FDA approved for NASAL specimens only), is one component of a comprehensive MRSA colonization surveillance program. It is not intended to diagnose MRSA infection nor to guide or monitor treatment for MRSA infections. Performed at [facility], [place] [address] [place], [place] [address]          Radiology Studies: Dg Chest 2 View  Result Date: 07/30/2018 CLINICAL DATA:  Pt BIB EMS found laying on a  bench at the bus stop. Pt reports severe pain in both legs. Redness and swelling noted to the sites on both legs. Pt has bilateral AKA amputations. Hx of HTN and community acquired pneumonia. EXAM: CHEST - 2 VIEW COMPARISON:  07/24/2018 and older exams. FINDINGS: Cardiac silhouette is mildly enlarged. No mediastinal or hilar masses. No evidence of adenopathy. Prominent nipple shadow overlies the right mid to lower lung. Mild linear scarring extends laterally from the right hilum, stable. Lungs are otherwise clear. No pleural effusion or pneumothorax. Skeletal structures are intact. IMPRESSION: No acute cardiopulmonary disease. Electronically Signed   By: Lajean Manes M.D.   On: 07/30/2018 18:05   Dg Femur Min 2 Views Left  Result Date: 07/30/2018 CLINICAL DATA:  Patient found laying on a bench at a bus stop with pain in the legs. Erythema and swelling noted at site of amputation. EXAM: LEFT FEMUR 2 VIEWS COMPARISON:  None. FINDINGS: Periosteal new bone formation is noted about the tip of the remaining left  femoral diaphysis status post above knee amputation. Findings raise concern for changes of osteomyelitis. Osteoarthritis of the left hip with moderate-to-marked joint space narrowing is identified. No acute fracture. IMPRESSION: Periosteal new bone formation about the tip of the remaining femoral diaphysis is noted. Findings raise concern for osteomyelitis. Electronically Signed   By: Ashley Royalty M.D.   On: 07/30/2018 18:04   Dg Femur Min 2 Views Right  Result Date: 07/30/2018 CLINICAL DATA:  Pt BIB EMS found laying on a bench at the bus stop. Pt reports severe pain in both legs. Redness and swelling noted to the sites on both legs. Pt has bilateral AKA amputations. Hx of HTN and community acquired pneumonia. EXAM: RIGHT FEMUR 2 VIEWS COMPARISON:  None. FINDINGS: Previous amputation across the midshaft of the right femur. Amputated margin appears well defined no focal bone resorption is seen to suggest  osteomyelitis. There is no acute fracture. Hip joint is normally spaced and aligned. No soft tissue air. IMPRESSION: 1. No fracture.  No evidence of osteomyelitis.  No acute finding. Electronically Signed   By: Lajean Manes M.D.   On: 07/30/2018 18:02   US Abdomen Limited Ruq  Result Date: 07/30/2018 CLINICAL DATA:  Elevated liver enzymes EXAM: ULTRASOUND ABDOMEN LIMITED RIGHT UPPER QUADRANT COMPARISON:  July 29, 2018 CT abdomen and pelvis FINDINGS: Gallbladder: No gallstones are evident. Gallbladder wall is slightly thickened, a finding that may be due to ascites which is present. There is no appreciable pericholecystic fluid. No sonographic [name] sign noted by sonographer. Common bile duct: Diameter: 4 mm. No intrahepatic or extrahepatic biliary duct dilatation. Liver: No focal lesion identified. Within normal limits in parenchymal echogenicity. Portal vein is patent on color Doppler imaging with normal direction of blood flow towards the liver. Ascites noted. IMPRESSION: 1.  Relatively mild ascites noted. 2. Gallbladder wall slightly thickened. This is a finding that may be secondary to ascites. No gallstones or pericholecystic fluid seen. It should be noted that acalculus cholecystitis could present in this manner. In this regard, it may be prudent to correlate with nuclear medicine hepatobiliary imaging study to assess for cystic duct patency. 3. No focal liver lesions are appreciable. The changes suggesting chronic passive congestion on recent CT within the liver are not demonstrated by ultrasound. Electronically Signed   By: Lowella Grip III M.D.   On: 07/30/2018 21:52        Scheduled Meds: . gabapentin  100 mg Oral TID   Continuous Infusions: . piperacillin-tazobactam 3.375 g (08/01/18 0730)  . vancomycin 500 mg (08/01/18 0552)     LOS: 2 days        Aline August, MD [facility] Pager [phone]  If 7PM-7AM, please contact  night-coverage [email or web address] Password [email or web address] 08/01/2018, 11:12 AM

## 2018-08-01 NOTE — Progress Notes (Signed)
PT Cancellation Note  Patient Details Name: [name] MRN: [id number] DOB: 09-04-1961   Cancelled Treatment:    Reason Eval/Treat Not Completed: Fatigue/lethargy limiting ability to participate;Patient declined, no reason specified Pt refused, stating "I am too hungry, I just want to eat, I'm not doing anything today".  PT will attempt eval again as appropriate  DONAWERTH,Ashantia 08/01/2018, 10:14 AM

## 2018-08-01 NOTE — Consult Note (Signed)
ORTHOPAEDIC CONSULTATION  REQUESTING PHYSICIAN: Aline August, MD  Chief Complaint: Ulceration right above-the-knee amputation.  HPI: [name] is a 57 y.o. female who presents with ulcer over her right above-the-knee amputation.  Patient states that both legs are painful status post bilateral above-the-knee amputations as well as upper extremity digit amputation.  Past Medical History:  Diagnosis Date  . CAP (community acquired pneumonia) 03/2014 X 2  . Cocaine abuse ([place])    ongoing with resultant vaculitis.  . Depression   . Headache    "weekly" (07/29/2016)  . Hypertension   . Inflammatory arthritis   . Migraines    "probably 5-6/yr" (07/29/2016)  . Normocytic anemia    BL Hgb 9.8-12. Last anemia panel 04/2010 - showing Fe 19, ferritin 101.  Pt on monthly B12 injections  . Rheumatoid arthritis(714.0)    patient reported  . 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 Followed by Dr. Louanne Skye   Past Surgical History:  Procedure Laterality Date  . AMPUTATION Left 05/22/2016   Procedure: AMPUTATION LEFT LONG FINGER;  Surgeon: Marybelle Killings, MD;  Location: [facility];  Service: Orthopedics;  Laterality: Left;  . AMPUTATION Bilateral 04/10/2017   Procedure: AMPUTATION BELOW KNEE;  Surgeon: Newt Minion, MD;  Location: [facility];  Service: Orthopedics;  Laterality: Bilateral;  . AMPUTATION Bilateral 02/06/2018   Procedure: AMPUTATION ABOVE KNEE;  Surgeon: Newt Minion, MD;  Location: [facility];  Service: Orthopedics;  Laterality: Bilateral;  . HERNIA REPAIR     "stomach"  . I&D EXTREMITY Right 09/26/2015   Procedure: IRRIGATION AND DEBRIDEMENT LEG WOUND  VAC PLACEMENT.;  Surgeon: Loel Lofty Dillingham, DO;  Location: [facility];  Service: Plastics;  Laterality: Right;  . INCISION AND DRAINAGE OF WOUND Bilateral 10/20/2016   Procedure: IRRIGATION AND DEBRIDEMENT WOUND BILATERAL;  Surgeon: Edrick Kins, DPM;  Location: [facility];  Service: Podiatry;   Laterality: Bilateral;  . IRRIGATION AND DEBRIDEMENT ABSCESS Bilateral 09/26/2013   Procedure: DEBRIDEMENT ULCERS BILATERAL THIGHS;  Surgeon: Gwenyth Ober, MD;  Location: [facility];  Service: General;  Laterality: Bilateral;  . SKIN BIOPSY Bilateral    shin nodules   Social History   Socioeconomic History  . Marital status: Single    Spouse name: Not on file  . Number of children: 0  . Years of education: 11th grade  . Highest education level: Not on file  Occupational History  . Occupation: Disability    Comment: since 2011, due to her rheumatoid arthritis  Social Needs  . Financial resource strain: Not very hard  . Food insecurity:    Worry: Sometimes true    Inability: Sometimes true  . Transportation needs:    Medical: Yes    Non-medical: Yes  Tobacco Use  . Smoking status: Current Every Day Smoker    Packs/day: 0.12    Years: 38.00    Pack years: 4.56    Types: Cigarettes  . Smokeless tobacco: Never Used  . Tobacco comment: 2 A DAY  Substance and Sexual Activity  . Alcohol use: No    Alcohol/week: 0.0 standard drinks  . Drug use: Yes    Types: "Crack" cocaine, Cocaine    Comment: Smoked crack 06/02/2017  . Sexual activity: Not Currently  Lifestyle  . Physical activity:    Days per week: 7 days    Minutes per session: Not on file  . Stress: Very much  Relationships  .  Social connections:    Talks on phone: More than three times a week    Gets together: More than three times a week    Attends religious service: Never    Active member of club or organization: No    Attends meetings of clubs or organizations: Never    Relationship status: Never married  Other Topics Concern  . Not on file  Social History Narrative   Unemployed:  cleaning in past   Living at [place]; has Medicaid.   crack/cocaine use; pt denies IVDU   tobacco:  1/2 ppd, trying to quit   alcohol:  none       Family History  Problem Relation Age of Onset  . Breast cancer Mother         Breast cancer  . Alcohol abuse Mother   . Colon cancer Maternal Aunt 85  . Alcohol abuse Father    - negative except otherwise stated in the family history section Allergies  Allergen Reactions  . Acetaminophen Swelling and Other (See Comments)    Reaction:  Eyelid swelling  . Haldol [Haloperidol Lactate]     Possible eye swelling was given together with Toradol unsure if caused the reaction  . Lisinopril     UNSPECIFIED REACTION   . Toradol [Ketorolac Tromethamine]     Eye swelling   Prior to Admission medications   Medication Sig Start Date End Date Taking? Authorizing Provider  amLODipine (NORVASC) 10 MG tablet Take 1 tablet (10 mg total) by mouth daily. 06/23/18   Doreatha Lew, MD  furosemide (LASIX) 40 MG tablet Take 1 tablet (40 mg total) by mouth daily. 06/23/18   Doreatha Lew, MD  gabapentin (NEURONTIN) 100 MG capsule Take 1 capsule (100 mg total) by mouth 3 (three) times daily. 07/24/18   Quintella Reichert, MD  losartan (COZAAR) 25 MG tablet Take 1 tablet (25 mg total) by mouth daily. 06/23/18   Doreatha Lew, MD  ondansetron (ZOFRAN ODT) 4 MG disintegrating tablet Take 1 tablet (4 mg total) by mouth every 8 (eight) hours as needed for nausea or vomiting. 07/29/18   Montine Circle, PA-C  pantoprazole (PROTONIX) 40 MG tablet Take 1 tablet (40 mg total) by mouth daily. 07/14/18   Amin, Jeanella Flattery, MD  potassium chloride SA (K-DUR,KLOR-CON) 20 MEQ tablet Take 1 tablet (20 mEq total) by mouth daily for 2 days. 07/13/18 07/24/26  Damita Lack, MD   Dg Chest 2 View  Result Date: 07/30/2018 CLINICAL DATA:  Pt BIB EMS found laying on a bench at the bus stop. Pt reports severe pain in both legs. Redness and swelling noted to the sites on both legs. Pt has bilateral AKA amputations. Hx of HTN and community acquired pneumonia. EXAM: CHEST - 2 VIEW COMPARISON:  07/24/2018 and older exams. FINDINGS: Cardiac silhouette is mildly enlarged. No mediastinal or hilar  masses. No evidence of adenopathy. Prominent nipple shadow overlies the right mid to lower lung. Mild linear scarring extends laterally from the right hilum, stable. Lungs are otherwise clear. No pleural effusion or pneumothorax. Skeletal structures are intact. IMPRESSION: No acute cardiopulmonary disease. Electronically Signed   By: Lajean Manes M.D.   On: 07/30/2018 18:05   Dg Femur Min 2 Views Left  Result Date: 07/30/2018 CLINICAL DATA:  Patient found laying on a bench at a bus stop with pain in the legs. Erythema and swelling noted at site of amputation. EXAM: LEFT FEMUR 2 VIEWS COMPARISON:  None. FINDINGS: Periosteal  new bone formation is noted about the tip of the remaining left femoral diaphysis status post above knee amputation. Findings raise concern for changes of osteomyelitis. Osteoarthritis of the left hip with moderate-to-marked joint space narrowing is identified. No acute fracture. IMPRESSION: Periosteal new bone formation about the tip of the remaining femoral diaphysis is noted. Findings raise concern for osteomyelitis. Electronically Signed   By: Ashley Royalty M.D.   On: 07/30/2018 18:04   Dg Femur Min 2 Views Right  Result Date: 07/30/2018 CLINICAL DATA:  Pt BIB EMS found laying on a bench at the bus stop. Pt reports severe pain in both legs. Redness and swelling noted to the sites on both legs. Pt has bilateral AKA amputations. Hx of HTN and community acquired pneumonia. EXAM: RIGHT FEMUR 2 VIEWS COMPARISON:  None. FINDINGS: Previous amputation across the midshaft of the right femur. Amputated margin appears well defined no focal bone resorption is seen to suggest osteomyelitis. There is no acute fracture. Hip joint is normally spaced and aligned. No soft tissue air. IMPRESSION: 1. No fracture.  No evidence of osteomyelitis.  No acute finding. Electronically Signed   By: Lajean Manes M.D.   On: 07/30/2018 18:02   US Abdomen Limited Ruq  Result Date: 07/30/2018 CLINICAL DATA:   Elevated liver enzymes EXAM: ULTRASOUND ABDOMEN LIMITED RIGHT UPPER QUADRANT COMPARISON:  July 29, 2018 CT abdomen and pelvis FINDINGS: Gallbladder: No gallstones are evident. Gallbladder wall is slightly thickened, a finding that may be due to ascites which is present. There is no appreciable pericholecystic fluid. No sonographic [name] sign noted by sonographer. Common bile duct: Diameter: 4 mm. No intrahepatic or extrahepatic biliary duct dilatation. Liver: No focal lesion identified. Within normal limits in parenchymal echogenicity. Portal vein is patent on color Doppler imaging with normal direction of blood flow towards the liver. Ascites noted. IMPRESSION: 1.  Relatively mild ascites noted. 2. Gallbladder wall slightly thickened. This is a finding that may be secondary to ascites. No gallstones or pericholecystic fluid seen. It should be noted that acalculus cholecystitis could present in this manner. In this regard, it may be prudent to correlate with nuclear medicine hepatobiliary imaging study to assess for cystic duct patency. 3. No focal liver lesions are appreciable. The changes suggesting chronic passive congestion on recent CT within the liver are not demonstrated by ultrasound. Electronically Signed   By: Lowella Grip III M.D.   On: 07/30/2018 21:52   - pertinent xrays, CT, MRI studies were reviewed and independently interpreted  Positive ROS: All other systems have been reviewed and were otherwise negative with the exception of those mentioned in the HPI and as above.  Physical Exam: General: Alert, no acute distress Psychiatric: Patient is competent for consent with normal mood and affect Lymphatic: No axillary or cervical lymphadenopathy Cardiovascular: No pedal edema Respiratory: No cyanosis, no use of accessory musculature GI: No organomegaly, abdomen is soft and non-tender    Images:  @ENCIMAGES @  Labs:  Lab Results  Component Value Date   HGBA1C 5.0 05/30/2018     HGBA1C 5.5 03/30/2017   HGBA1C 5.1 04/21/2015   ESRSEDRATE 8 07/11/2018   ESRSEDRATE 104 (H) 04/08/2017   ESRSEDRATE 67 (H) 12/23/2016   CRP 1.5 (H) 07/11/2018   CRP 2.0 (H) 04/08/2017   CRP 5.1 (H) 12/23/2016   LABURIC 3.7 10/29/2010   LABURIC 3.4 10/22/2010   LABURIC 2.9 05/31/2009   REPTSTATUS 02/07/2018 FINAL 02/02/2018   REPTSTATUS 02/07/2018 FINAL 02/02/2018   GRAMSTAIN  10/23/2017  MODERATE WBC PRESENT, PREDOMINANTLY PMN MODERATE GRAM POSITIVE COCCI IN PAIRS IN CLUSTERS RARE GRAM NEGATIVE COCCOBACILLI    CULT  02/02/2018    NO GROWTH 5 DAYS Performed at [facility], [place] [address] [place], [place] [address]    CULT  02/02/2018    NO GROWTH 5 DAYS Performed at [facility] [address] [place], [place] [address]    LABORGA METHICILLIN RESISTANT STAPHYLOCOCCUS AUREUS 10/23/2017    Lab Results  Component Value Date   ALBUMIN 2.8 (L) 07/31/2018   ALBUMIN 3.4 (L) 07/30/2018   ALBUMIN 3.8 07/28/2018   PREALBUMIN 13.6 (L) 04/08/2017   PREALBUMIN 14.7 (L) 10/11/2013   LABURIC 3.7 10/29/2010   LABURIC 3.4 10/22/2010   LABURIC 2.9 05/31/2009    Neurologic: Patient does not have protective sensation bilateral lower extremities.   MUSCULOSKELETAL:   Skin: Examination of the left transtibial amputation is well-healed there is no redness no cellulitis no drainage no signs of infection.  Examination the right transtibial amputation patient has a 1 cm ulcer over the residual limb this is 10 mm in diameter 1 mm deep with good granulation tissue this does not probe to bone or tendon.  This is not directly over the bone.  Patient's current albumin is 2.8 with moderate protein caloric malnutrition.  Review of the radiographs shows some new bone over the distal femur on the right.  The soft tissue over this area has no redness no swelling, clinically patient does not appear to have osteomyelitis.  Assessment: Assessment: Stable bilateral above-the-knee  amputations with a small granulating wound on the lateral aspect of her right transtibial amputation, no clinical signs of osteomyelitis of the right distal femur.  Plan: Plan: Patient can use a Band-Aid over this ulcer.  Patient complains of the dry gauze sticking to the wound.  No surgical intervention needed at this time I will follow-up in the office.  Thank you for the consult and the opportunity to see Ms. Burns Spain, MD [place] [phone] 11:58 AM

## 2018-08-02 DIAGNOSIS — Z515 Encounter for palliative care: Secondary | ICD-10-CM

## 2018-08-02 DIAGNOSIS — Z7189 Other specified counseling: Secondary | ICD-10-CM

## 2018-08-02 DIAGNOSIS — I10 Essential (primary) hypertension: Secondary | ICD-10-CM

## 2018-08-02 LAB — CBC WITH DIFFERENTIAL/PLATELET
Abs Immature Granulocytes: 0.08 10*3/uL — ABNORMAL HIGH (ref 0.00–0.07)
BASOS PCT: 0 %
Basophils Absolute: 0 10*3/uL (ref 0.0–0.1)
EOS ABS: 0.1 10*3/uL (ref 0.0–0.5)
Eosinophils Relative: 1 %
HCT: 34.6 % — ABNORMAL LOW (ref 36.0–46.0)
Hemoglobin: 10.5 g/dL — ABNORMAL LOW (ref 12.0–15.0)
IMMATURE GRANULOCYTES: 1 %
Lymphocytes Relative: 11 %
Lymphs Abs: 1.3 10*3/uL (ref 0.7–4.0)
MCH: 25.6 pg — AB (ref 26.0–34.0)
MCHC: 30.3 g/dL (ref 30.0–36.0)
MCV: 84.4 fL (ref 80.0–100.0)
MONO ABS: 0.8 10*3/uL (ref 0.1–1.0)
MONOS PCT: 6 %
NEUTROS PCT: 81 %
Neutro Abs: 9.9 10*3/uL — ABNORMAL HIGH (ref 1.7–7.7)
PLATELETS: 264 10*3/uL (ref 150–400)
RBC: 4.1 MIL/uL (ref 3.87–5.11)
RDW: 20 % — AB (ref 11.5–15.5)
WBC: 12.2 10*3/uL — ABNORMAL HIGH (ref 4.0–10.5)
nRBC: 0 % (ref 0.0–0.2)

## 2018-08-02 LAB — C-REACTIVE PROTEIN: CRP: 5.3 mg/dL — ABNORMAL HIGH (ref <1.0)

## 2018-08-02 LAB — COMPREHENSIVE METABOLIC PANEL
ALT: 131 U/L — ABNORMAL HIGH (ref 0–44)
ANION GAP: 7 (ref 5–15)
AST: 76 U/L — ABNORMAL HIGH (ref 15–41)
Albumin: 2.6 g/dL — ABNORMAL LOW (ref 3.5–5.0)
Alkaline Phosphatase: 89 U/L (ref 38–126)
BUN: 18 mg/dL (ref 6–20)
CALCIUM: 8.9 mg/dL (ref 8.9–10.3)
CO2: 23 mmol/L (ref 22–32)
Chloride: 109 mmol/L (ref 98–111)
Creatinine, Ser: 0.96 mg/dL (ref 0.44–1.00)
GFR calc Af Amer: >60 mL/min (ref >60)
GFR calc non Af Amer: >60 mL/min (ref >60)
GLUCOSE: 122 mg/dL — AB (ref 70–99)
Potassium: 3.3 mmol/L — ABNORMAL LOW (ref 3.5–5.1)
Sodium: 139 mmol/L (ref 135–145)
TOTAL PROTEIN: 5.8 g/dL — AB (ref 6.5–8.1)
Total Bilirubin: 2.1 mg/dL — ABNORMAL HIGH (ref 0.3–1.2)

## 2018-08-02 LAB — MAGNESIUM: Magnesium: 1.7 mg/dL (ref 1.7–2.4)

## 2018-08-02 LAB — HIV ANTIBODY (ROUTINE TESTING W REFLEX): HIV SCREEN 4TH GENERATION: NONREACTIVE

## 2018-08-02 MED ORDER — POTASSIUM CHLORIDE CRYS ER 20 MEQ PO TBCR
40.0000 meq | EXTENDED_RELEASE_TABLET | Freq: Once | ORAL | Status: AC
  Administered 2018-08-02: 40 meq via ORAL
  Filled 2018-08-02: qty 2

## 2018-08-02 NOTE — Progress Notes (Signed)
1500 Pt transfers from the bed to the wheelchair independently, standby assist.  Wound care teachings given to pt for right stump wound. Discharge instructions given to pt. Verbalized understanding. Discharged, transported by a cab.

## 2018-08-02 NOTE — Evaluation (Signed)
Physical Therapy Evaluation Patient Details Name: [name] MRN: [id number] DOB: 08-04-1961 Today's Date: 08/02/2018   History of Present Illness  57 year old female with history of cocaine abuse, grade 2 diastolic congestive heart failure with EF of 60 to 65%, essential hypertension, peripheral vascular disease status post bilateral AKA, homeless presented with AKA stump pain and redness with x-ray femur concerning for osteomyelitis.  Clinical Impression  Patient evaluated by Physical Therapy with no further acute PT needs identified. All education has been completed and the patient has no further questions. Pt appears to be near PLOF with cognitive deficits and decreased safety awareness, but is able to perform transfers and w/c mobility without assistance of others. Patient is homeless and states that she is ready to be DC, and feels confident with her ability to transfer and mobilize indepedently. See below for any follow-up Physical Therapy or equipment needs. PT is signing off. Thank you for this referral.     Follow Up Recommendations Supervision for mobility/OOB;No PT follow up    Equipment Recommendations  None recommended by PT       Precautions / Restrictions Precautions Precautions: Fall Restrictions Weight Bearing Restrictions: No      Mobility  Bed Mobility Overal bed mobility: Modified Independent             General bed mobility comments: Increased time and effort for supine to sit in bed. Pt has decreased safety awareness with sit to supine.  Transfers Overall transfer level: Needs assistance   Transfers: Comptroller transfers: Min guard   General transfer comment: Pt min guard for safety. Transfers to and from w/c without other assistance, performs all transfers moving posteriorly. When returning to bed, pt pivots with use of UE to transfer posteriorly back into bed. Decreased safety awareness noted during  posterior transfers.   Information systems manager parts: Independent Wheelchair Assistance Details (indicate cue type and reason): Patient did not mobilize in w/c but stated she spends all day in w/c, propelling forward with use of both UE, and states that she tries to weight shift throughout the day.     Balance Overall balance assessment: Modified Independent(with use of UE support in sitting)                                           Pertinent Vitals/Pain Pain Assessment: Faces Faces Pain Scale: Hurts little more Pain Location: right AKA Pain Descriptors / Indicators: Grimacing;Constant;Tender Pain Intervention(s): Limited activity within patient's tolerance;Repositioned;Monitored during session    Home Living Family/patient expects to be discharged to:: Shelter/Homeless Living Arrangements: Alone(sleeps at bus stop)                    Prior Function Level of Independence: Independent with assistive device(s)(Could get everywhere with wheelchair)         Comments: Pt reports she uses public restrooms and sponge baths. Pt uses gait belt to secure trunk into w/c and prevent falls. Pt was independent with transfers from benches to w/c.     Hand Dominance   Dominant Hand: Right    Extremity/Trunk Assessment   Upper Extremity Assessment Upper Extremity Assessment: Defer to OT evaluation    Lower Extremity Assessment Lower Extremity Assessment: RLE deficits/detail RLE Deficits / Details: R AKA residual limb has increased sensitivity and pain with transfers/activity  Cervical / Trunk Assessment Cervical / Trunk Assessment: Normal  Communication   Communication: No difficulties  Cognition Arousal/Alertness: Awake/alert Behavior During Therapy: WFL for tasks assessed/performed Overall Cognitive Status: No family/caregiver present to determine baseline cognitive functioning Area of Impairment:  Safety/judgement;Awareness                         Safety/Judgement: Decreased awareness of safety Awareness: Intellectual;Anticipatory   General Comments: Feel pt is close to baseline function.       General Comments General comments (skin integrity, edema, etc.): SpO2 90% on RA, pt states that despite efforts to weight shift when sitting in w/c, she still develops pressure sores occasionally.      Assessment/Plan    PT Assessment Patent does not need any further PT services         PT Goals (Current goals can be found in the Care Plan section)  Acute Rehab PT Goals Patient Stated Goal: "Go home"         Co-evaluation PT/OT/SLP Co-Evaluation/Treatment: Yes Reason for Co-Treatment: Other (comment)(for pain and compliance to treat) PT goals addressed during session: Mobility/safety with mobility OT goals addressed during session: ADL's and self-care       AM-PAC PT "6 Clicks" Daily Activity  Outcome Measure Difficulty turning over in bed (including adjusting bedclothes, sheets and blankets)?: A Little Difficulty moving from lying on back to sitting on the side of the bed? : A Little Difficulty sitting down on and standing up from a chair with arms (e.g., wheelchair, bedside commode, etc,.)?: Unable Help needed moving to and from a bed to chair (including a wheelchair)?: None Help needed walking in hospital room?: Total Help needed climbing 3-5 steps with a railing? : Total 6 Click Score: 13    End of Session   Activity Tolerance: Patient limited by fatigue Patient left: in bed;with call bell/phone within reach;with bed alarm set Nurse Communication: Mobility status;Other (comment)(nurse informed pt was left in bed without external urinary catheter in place.) PT Visit Diagnosis: Pain;Muscle weakness (generalized) (M62.81) Pain - Right/Left: Right Pain - part of body: Right residual limb (AKA)    Time: 9373-4287 PT Time Calculation (min) (ACUTE ONLY): 11  min   Charges:   PT Evaluation $PT Eval Moderate Complexity: 1 Mod          Vernell Morgans, SPT Acute Rehabilitation Services Office [phone]   Vernell Morgans 08/02/2018, 1:02 PM

## 2018-08-02 NOTE — Discharge Summary (Signed)
Physician Discharge Summary  [name] HTD:[id number] DOB: 24-Oct-1960 DOA: 07/30/2018  PCP: Azzie Glatter, FNP  Admit date: 07/30/2018 Discharge date: 08/02/2018  Admitted From: Home Disposition: Home  Recommendations for Outpatient Follow-up:  1. Follow up with PCP in 1 week with repeat CBC/CMP 2. Follow-up with Dr. Sharol Given in a week 3. Comply with medications and follow-up 4. Abstain from illicit drug use   Home Health: Yes Equipment/Devices: None  Discharge Condition: Guarded CODE STATUS: Full Diet recommendation: Heart Healthy   Brief/Interim Summary: 57 year old female with history of cocaine abuse, grade 2 diastolic congestive heart failure with EF of 60 to 65%, essential hypertension, peripheral vascular disease status post bilateral AKA, homeless presented with AKA stump pain and redness with x-ray femur concerning for osteomyelitis.  Patient was started on broad-spectrum antibiotics.  Patient was evaluate by Dr. Sharol Given from orthopedics who stated that patient does not have osteomyelitis and to continue wound care.  Antibiotics will be discontinued.  Patient will be discharged.  Discharge Diagnoses:  Active Problems:   Essential hypertension   Cocaine-induced vascular disorder (HCC)   Protein-calorie malnutrition, severe (HCC)   Tobacco abuse   Ulcer of amputation stump of lower extremity (HCC)   Homelessness   Elevated LFTs   Osteomyelitis (HCC)   Acute encephalopathy   Eye swelling, bilateral   Ulcer of right thigh, limited to breakdown of skin (HCC)   S/P bilateral above knee amputation (HCC)  Bilateral AKA with AKA ulcer -Patient was started on broad-spectrum antibiotics for concern for osteomyelitis.  Dr. Sharol Given evaluated the patient and stated that patient does not have any clinical signs of osteomyelitis.  Wound care as per Dr. Jess Barters recommendation.  No need for any surgical intervention.  Will stop antibiotics.  Discharge patient home with outpatient  follow-up with Dr. Sharol Given  Severe protein calorie malnutrition -Follow nutrition recommendations  Noncompliance to medications and follow-up -Patient is very noncompliant and still using cocaine.    Palliative care consult request was made which is still pending.  This can happen as an outpatient if patient wants -Patient is to be compliant with medication and follow-up  Essential hypertension -Blood pressure still intermittently elevated.    Patient is to comply with her medications.  Outpatient follow-up  Cocaine abuse -Counseled about cessation.  Social worker consult   Toxic encephalopathy -Probably secondary to cocaine abuse.  Mental status improved  Elevated LFTs -Slightly worsening but improving bilirubin -Right upper quadrant ultrasound was inconclusive and could not rule out acalculous cholecystitis and recommend HIDA scan.    HIDA scan was negative for biliary obstruction/cholecystitis.  Outpatient follow-up of LFTs.  Discharge Instructions  Discharge Instructions    Call MD for:  difficulty breathing, headache or visual disturbances   Complete by:  As directed    Call MD for:  extreme fatigue   Complete by:  As directed    Call MD for:  hives   Complete by:  As directed    Call MD for:  persistant dizziness or light-headedness   Complete by:  As directed    Call MD for:  persistant nausea and vomiting   Complete by:  As directed    Call MD for:  severe uncontrolled pain   Complete by:  As directed    Call MD for:  temperature >100.4   Complete by:  As directed    Diet - low sodium heart healthy   Complete by:  As directed    Increase activity slowly   Complete  by:  As directed      Allergies as of 08/02/2018      Reactions   Acetaminophen Swelling, Other (See Comments)   Reaction:  Eyelid swelling   Haldol [haloperidol Lactate]    Possible eye swelling was given together with Toradol unsure if caused the reaction   Lisinopril    UNSPECIFIED REACTION     Toradol [ketorolac Tromethamine]    Eye swelling      Medication List    STOP taking these medications   potassium chloride SA 20 MEQ tablet Commonly known as:  K-DUR,KLOR-CON     TAKE these medications   amLODipine 10 MG tablet Commonly known as:  NORVASC Take 1 tablet (10 mg total) by mouth daily.   furosemide 40 MG tablet Commonly known as:  LASIX Take 1 tablet (40 mg total) by mouth daily.   gabapentin 100 MG capsule Commonly known as:  NEURONTIN Take 1 capsule (100 mg total) by mouth 3 (three) times daily.   losartan 25 MG tablet Commonly known as:  COZAAR Take 1 tablet (25 mg total) by mouth daily.   ondansetron 4 MG disintegrating tablet Commonly known as:  ZOFRAN-ODT Take 1 tablet (4 mg total) by mouth every 8 (eight) hours as needed for nausea or vomiting.   pantoprazole 40 MG tablet Commonly known as:  PROTONIX Take 1 tablet (40 mg total) by mouth daily.      Follow-up Information    Newt Minion, MD Follow up in 1 week(s).   Specialty:  Orthopedic Surgery Contact information: [place] [address] [phone]        Azzie Glatter, FNP. Schedule an appointment as soon as possible for a visit in 1 week(s).   Specialty:  Family Medicine Contact information: [address] [place] [address] [phone]          Allergies  Allergen Reactions  . Acetaminophen Swelling and Other (See Comments)    Reaction:  Eyelid swelling  . Haldol [Haloperidol Lactate]     Possible eye swelling was given together with Toradol unsure if caused the reaction  . Lisinopril     UNSPECIFIED REACTION   . Toradol [Ketorolac Tromethamine]     Eye swelling    Consultations:  Orthopedics   Procedures/Studies: Dg Chest 2 View  Result Date: 07/30/2018 CLINICAL DATA:  Pt BIB EMS found [place] bench [place] bus [place]. Pt reports severe pain in both legs. Redness and swelling noted to the sites on both legs. Pt has  bilateral AKA amputations. Hx of HTN and community acquired pneumonia. EXAM: CHEST - 2 VIEW COMPARISON:  07/24/2018 and older exams. FINDINGS: Cardiac silhouette is mildly enlarged. No mediastinal or hilar masses. No evidence of adenopathy. Prominent nipple shadow overlies the right mid to lower lung. Mild linear scarring extends laterally from the right hilum, stable. Lungs are otherwise clear. No pleural effusion or pneumothorax. Skeletal structures are intact. IMPRESSION: No acute cardiopulmonary disease. Electronically Signed   By: Lajean Manes M.D.   On: 07/30/2018 18:05   Nm Hepatobiliary Liver Func  Result Date: 08/01/2018 CLINICAL DATA:  57 year old female with generalized abdominal pain. Gallbladder wall slightly thickened on recent ultrasound. Subsequent encounter. EXAM: NUCLEAR MEDICINE HEPATOBILIARY IMAGING TECHNIQUE: Sequential images of the abdomen were obtained out to 60 minutes following intravenous administration of radiopharmaceutical. RADIOPHARMACEUTICALS:  5.1 mCi Tc-61m Choletec IV COMPARISON:  07/30/2018 ultrasound. FINDINGS: Prompt uptake and biliary excretion of activity by the liver is  seen. Gallbladder activity is visualized, consistent with patency of cystic duct. Biliary activity passes into small bowel, consistent with patent common bile duct. IMPRESSION: Gallbladder and small bowel visualized consistent with patent cystic duct and patent common bile duct respectively. Electronically Signed   By: Genia Del M.D.   On: 08/01/2018 15:25   Ct Abdomen Pelvis W Contrast  Result Date: 07/29/2018 CLINICAL DATA:  Abdominal pain, nausea, vomiting and diarrhea beginning this afternoon. History of substance abuse, hernia repair. EXAM: CT ABDOMEN AND PELVIS WITH CONTRAST TECHNIQUE: Multidetector CT imaging of the abdomen and pelvis was performed using the standard protocol following bolus administration of intravenous contrast. CONTRAST:  75m ISOVUE-300 IOPAMIDOL (ISOVUE-300)  INJECTION 61% COMPARISON:  CT abdomen and pelvis June 21, 2018 FINDINGS: LOWER CHEST: Lung bases are clear. Stable cardiomegaly, reflux contrast consistent with RIGHT heart failure. No pericardial effusion. HEPATOBILIARY: Heterogeneous liver with enlarged IVC with contrast reflux consistent with passive hepatic congestion. Mild hepatomegaly. Normal gallbladder. PANCREAS: Too small to characterize hypodensity in spleen. SPLEEN: Normal. ADRENALS/URINARY TRACT: Kidneys are orthotopic, demonstrating symmetric enhancement. Multifocal scarring bilateral kidneys. No nephrolithiasis, hydronephrosis or solid renal masses. The unopacified ureters are normal in course and caliber. Delayed imaging through the kidneys demonstrates symmetric prompt contrast excretion within the proximal urinary collecting system. Urinary bladder is partially distended and unremarkable. Normal adrenal glands. STOMACH/BOWEL: The stomach, small and large bowel are normal in course and caliber without inflammatory changes. Mildly thickened: Consistent with ascites. Normal appendix. VASCULAR/LYMPHATIC: Aortoiliac vessels are normal in course and caliber. Moderate calcific atherosclerosis. No lymphadenopathy by CT size criteria. REPRODUCTIVE: Normal. OTHER: Moderate ascites decreased from prior examination. No intraperitoneal free air. MUSCULOSKELETAL: Nonacute. Mild anasarca. Old RIGHT L3 and L4 transverse process fractures. RIGHT hip loose bodies. Moderate to severe LEFT hip osteoarthrosis. IMPRESSION: 1. No acute intra-abdominal/pelvic process. 2. Moderate ascites, decreased from prior examination. 3. Stable hepatomegaly with findings of passive hepatic congestion. Aortic Atherosclerosis (ICD10-I70.0). Electronically Signed   By: CElon AlasM.D.   On: 07/29/2018 00:33   Dg Chest Portable 1 View  Result Date: 07/24/2018 CLINICAL DATA:  Chest pain EXAM: PORTABLE CHEST 1 VIEW COMPARISON:  Chest radiograph 06/20/2018 FINDINGS: Moderate  cardiomegaly with calcific aortic atherosclerosis. No pulmonary edema or focal airspace consolidation. No pleural effusion or pneumothorax. IMPRESSION: Unchanged moderate cardiomegaly without pulmonary edema. Electronically Signed   By: KUlyses JarredM.D.   On: 07/24/2018 03:55   Dg Femur Min 2 Views Left  Result Date: 07/30/2018 CLINICAL DATA:  Patient found laying on a bench at a bus stop with pain in the legs. Erythema and swelling noted at site of amputation. EXAM: LEFT FEMUR 2 VIEWS COMPARISON:  None. FINDINGS: Periosteal new bone formation is noted about the tip of the remaining left femoral diaphysis status post above knee amputation. Findings raise concern for changes of osteomyelitis. Osteoarthritis of the left hip with moderate-to-marked joint space narrowing is identified. No acute fracture. IMPRESSION: Periosteal new bone formation about the tip of the remaining femoral diaphysis is noted. Findings raise concern for osteomyelitis. Electronically Signed   By: DAshley RoyaltyM.D.   On: 07/30/2018 18:04   Dg Femur Min 2 Views Right  Result Date: 07/30/2018 CLINICAL DATA:  Pt BIB EMS found laying on a bench at the bus stop. Pt reports severe pain in both legs. Redness and swelling noted to the sites on both legs. Pt has bilateral AKA amputations. Hx of HTN and community acquired pneumonia. EXAM: RIGHT FEMUR 2 VIEWS COMPARISON:  None. FINDINGS:  Previous amputation across the midshaft of the right femur. Amputated margin appears well defined no focal bone resorption is seen to suggest osteomyelitis. There is no acute fracture. Hip joint is normally spaced and aligned. No soft tissue air. IMPRESSION: 1. No fracture.  No evidence of osteomyelitis.  No acute finding. Electronically Signed   By: Lajean Manes M.D.   On: 07/30/2018 18:02   US Abdomen Limited Ruq  Result Date: 07/30/2018 CLINICAL DATA:  Elevated liver enzymes EXAM: ULTRASOUND ABDOMEN LIMITED RIGHT UPPER QUADRANT COMPARISON:  July 29, 2018 CT abdomen and pelvis FINDINGS: Gallbladder: No gallstones are evident. Gallbladder wall is slightly thickened, a finding that may be due to ascites which is present. There is no appreciable pericholecystic fluid. No sonographic [name] sign noted by sonographer. Common bile duct: Diameter: 4 mm. No intrahepatic or extrahepatic biliary duct dilatation. Liver: No focal lesion identified. Within normal limits in parenchymal echogenicity. Portal vein is patent on color Doppler imaging with normal direction of blood flow towards the liver. Ascites noted. IMPRESSION: 1.  Relatively mild ascites noted. 2. Gallbladder wall slightly thickened. This is a finding that may be secondary to ascites. No gallstones or pericholecystic fluid seen. It should be noted that acalculus cholecystitis could present in this manner. In this regard, it may be prudent to correlate with nuclear medicine hepatobiliary imaging study to assess for cystic duct patency. 3. No focal liver lesions are appreciable. The changes suggesting chronic passive congestion on recent CT within the liver are not demonstrated by ultrasound. Electronically Signed   By: Lowella Grip III M.D.   On: 07/30/2018 21:52      Subjective: Patient seen and examined at bedside.  She is a  poor historian.  Denies any worsening fever, nausea or vomiting  Discharge Exam: Vitals:   08/02/18 0529 08/02/18 0958  BP: (!) 165/102 (!) 144/85  Pulse: (!) 101 100  Resp: 14   Temp: 98.8 F (37.1 C)   SpO2: 97% 100%   Vitals:   08/01/18 1500 08/01/18 2047 08/02/18 0529 08/02/18 0958  BP: (!) 150/97 131/78 (!) 165/102 (!) 144/85  Pulse: 87 96 (!) 101 100  Resp: 18 14 14    Temp: 99.1 F (37.3 C) 98.4 F (36.9 C) 98.8 F (37.1 C)   TempSrc: Oral Oral Oral   SpO2: 100% 98% 97% 100%  Weight:      Height:        General: Pt is alert, awake, not in acute distress.  Poor historian Cardiovascular: rate controlled, S1/S2 + Respiratory: bilateral decreased  breath sounds at bases Abdominal: Soft, NT, ND, bowel sounds + Extremities: no edema, no cyanosis.  Bilateral AKA    The results of significant diagnostics from this hospitalization (including imaging, microbiology, ancillary and laboratory) are listed below for reference.     Microbiology: Recent Results (from the past 240 hour(s))  Blood culture (routine x 2)     Status: None (Preliminary result)   Collection Time: 07/30/18  7:08 PM  Result Value Ref Range Status   Specimen Description   Final    BLOOD LEFT HAND Performed at [facility] [address] [place], [place] [address]    Special Requests   Final    BOTTLES DRAWN AEROBIC AND ANAEROBIC Blood Culture adequate volume Performed at [facility] [address] [place], [place] [address]    Culture   Final    NO GROWTH 1 DAY Performed at [facility], [place]  [address], [place], [place] [address]    Report Status PENDING  Incomplete  Blood culture (routine x 2)     Status: None (Preliminary result)   Collection Time: 07/30/18  7:14 PM  Result Value Ref Range Status   Specimen Description   Final    BLOOD RIGHT HAND Performed at [facility] [address] [place], [place] [address]    Special Requests   Final    BOTTLES DRAWN AEROBIC AND ANAEROBIC Blood Culture adequate volume Performed at [facility] [address] [place], [place] [address]    Culture   Final    NO GROWTH 1 DAY Performed at [facility], [place] [address] [place],  [address]    Report Status PENDING  Incomplete  MRSA PCR Screening     Status: None   Collection Time: 07/30/18 11:31 PM  Result Value Ref Range Status   MRSA by PCR NEGATIVE NEGATIVE Final    Comment:        The GeneXpert MRSA Assay (FDA approved for NASAL specimens only), is one component of a comprehensive MRSA colonization surveillance program. It is not intended to diagnose  MRSA infection nor to guide or monitor treatment for MRSA infections. Performed at [facility], [place] [address] [place],  [address]      Labs: BNP (last 3 results) Recent Labs    05/19/18 2116 06/20/18 2239  BNP 3,603.1* 3,235.5*   Basic Metabolic Panel: Recent Labs  Lab 07/28/18 2211 07/30/18 1701 07/31/18 0348 08/02/18 0240  NA 132* 134* 134* 139  K 5.1 4.6 3.7 3.3*  CL 99 104 103 109  CO2 18* 18* 20* 23  GLUCOSE 89 115* 104* 122*  BUN 32* 28* 19 18  CREATININE 1.11* 0.80 0.86 0.96  CALCIUM 9.2 8.6* 8.6* 8.9  MG  --   --  1.7 1.7  PHOS  --   --  2.1*  --    Liver Function Tests: Recent Labs  Lab 07/28/18 2211 07/30/18 1701 07/31/18 0348 08/02/18 0240  AST 49* 195* 198* 76*  ALT 37 145* 160* 131*  ALKPHOS 100 115 107 89  BILITOT 3.3* 3.2* 2.6* 2.1*  PROT 7.3 6.6 5.8* 5.8*  ALBUMIN 3.8 3.4* 2.8* 2.6*   Recent Labs  Lab 07/28/18 2211  LIPASE 21   No results for input(s): AMMONIA in the last 168 hours. CBC: Recent Labs  Lab 07/28/18 2124 07/30/18 1701 07/31/18 0348 08/02/18 0240  WBC 9.3 9.0 11.3* 12.2*  NEUTROABS  --  6.9  --  9.9*  HGB 11.4* 11.2* 10.5* 10.5*  HCT 37.7 35.4* 33.8* 34.6*  MCV 87.3 84.1 83.9 84.4  PLT 289 297 268 264   Cardiac Enzymes: No results for input(s): CKTOTAL, CKMB, CKMBINDEX, TROPONINI in the last 168 hours. BNP: Invalid input(s): POCBNP CBG: No results for input(s): GLUCAP in the last 168 hours. D-Dimer No results for input(s): DDIMER in the last 72 hours. Hgb A1c No results for input(s): HGBA1C in the last 72 hours. Lipid Profile No results for input(s): CHOL, HDL, LDLCALC, TRIG, CHOLHDL, LDLDIRECT in the last 72 hours. Thyroid function studies Recent Labs    07/31/18 0348  TSH 1.650   Anemia work up No results for input(s): VITAMINB12, FOLATE, FERRITIN, TIBC, IRON, RETICCTPCT in the last 72 hours. Urinalysis    Component Value Date/Time   COLORURINE AMBER (A) 07/28/2018 2124    APPEARANCEUR HAZY (A) 07/28/2018 2124   LABSPEC 1.016 07/28/2018 2124  PHURINE 5.0 07/28/2018 2124   GLUCOSEU NEGATIVE 07/28/2018 2124   HGBUR MODERATE (A) 07/28/2018 2124   BILIRUBINUR NEGATIVE 07/28/2018 2124   BILIRUBINUR small 04/29/2015 [place] 07/28/2018 2124   PROTEINUR 30 (A) 07/28/2018 2124   UROBILINOGEN 1.0 07/26/2017 1550   NITRITE NEGATIVE 07/28/2018 2124   LEUKOCYTESUR NEGATIVE 07/28/2018 2124   Sepsis Labs Invalid input(s): PROCALCITONIN,  WBC,  LACTICIDVEN Microbiology Recent Results (from the past 240 hour(s))  Blood culture (routine x 2)     Status: None (Preliminary result)   Collection Time: 07/30/18  7:08 PM  Result Value Ref Range Status   Specimen Description   Final    BLOOD LEFT HAND Performed at [facility], [place] [address], [place], [place] [address]    Special Requests   Final    BOTTLES DRAWN AEROBIC AND ANAEROBIC Blood Culture adequate volume Performed at [facility] [address], [place], [place] [address]    Culture   Final    NO GROWTH 1 DAY Performed at [facility], [place] [address], [place], [place] [address]    Report Status PENDING  Incomplete  Blood culture (routine x 2)     Status: None (Preliminary result)   Collection Time: 07/30/18  7:14 PM  Result Value Ref Range Status   Specimen Description   Final    BLOOD RIGHT HAND Performed at [facility] [address], [place], [place] [address]    Special Requests   Final    BOTTLES DRAWN AEROBIC AND ANAEROBIC Blood Culture adequate volume Performed at [facility] [address] [place], [place] [address]    Culture   Final    NO GROWTH 1 DAY Performed at [facility], [place] [address] [place], [place] [address]    Report Status PENDING  Incomplete  MRSA PCR Screening     Status: None   Collection Time: 07/30/18 11:31 PM  Result Value Ref Range Status   MRSA by PCR  NEGATIVE NEGATIVE Final    Comment:        The GeneXpert MRSA Assay (FDA approved for NASAL specimens only), is one component of a comprehensive MRSA colonization surveillance program. It is not intended to diagnose MRSA infection nor to guide or monitor treatment for MRSA infections. Performed at [facility], [place] [address] [place], [place] [address]      Time coordinating discharge: 35 minutes  SIGNED:   Aline August, MD  [facility] 08/02/2018, 10:21 AM Pager: [phone]  If 7PM-7AM, please contact night-coverage [email or web address] Password [email or web address]

## 2018-08-02 NOTE — Evaluation (Signed)
Occupational Therapy Evaluation Patient Details Name: [name] MRN: [id number] DOB: 1961-02-08 Today's Date: 08/02/2018    History of Present Illness 57 year old female with history of cocaine abuse, grade 2 diastolic congestive heart failure with EF of 60 to 65%, essential hypertension, peripheral vascular disease status post bilateral AKA, homeless presented with AKA stump pain and redness with x-ray femur concerning for osteomyelitis.    Clinical Impression   PTA, pt was living alone and reports "I sleep at the bus stop." Pt reports she was independent with ADLs and transfers to/from wheelchair. Pt performing ADLs and functional transfers at [place] for safety. Presenting near baseline function and planning for dc later today. Recommend dc home once medically stable per physician. All acute OT needs met and will sign off.     Follow Up Recommendations  No OT follow up;Supervision - Intermittent    Equipment Recommendations  None recommended by OT    Recommendations for Other Services       Precautions / Restrictions Precautions Precautions: Fall      Mobility Bed Mobility Overal bed mobility: Modified Independent             General bed mobility comments: Increased time and effort. Poor safety awareness when returning to bed  Transfers Overall transfer level: Needs assistance   Transfers: Comptroller transfers: Min guard   General transfer comment: Min GUard A for safety    Balance                                           ADL either performed or assessed with clinical judgement   ADL Overall ADL's : Needs assistance/impaired Eating/Feeding: Set up;Sitting   Grooming: Set up;Sitting   Upper Body Bathing: Set up;Sitting   Lower Body Bathing: Min guard;Sitting/lateral leans;Bed level   Upper Body Dressing : Set up;Sitting Upper Body Dressing Details (indicate cue type and reason):  donned new gown Lower Body Dressing: Min guard;Sitting/lateral leans;Bed level   Toilet Transfer: Min guard;+2 for safety/equipment;Anterior/posterior(Simulated to w/c) Toilet Transfer Details (indicate cue type and reason): Min Guard A for safety         Functional mobility during ADLs: Min guard(anterior/posterior t/f to w/c) General ADL Comments: Pt presenting near baseline function     Vision         Perception     Praxis      Pertinent Vitals/Pain Pain Assessment: Faces Faces Pain Scale: Hurts little more Pain Location: right AKA Pain Descriptors / Indicators: Constant;Discomfort;Grimacing Pain Intervention(s): Monitored during session;Repositioned     Hand Dominance Right   Extremity/Trunk Assessment Upper Extremity Assessment Upper Extremity Assessment: Overall WFL for tasks assessed   Lower Extremity Assessment Lower Extremity Assessment: Defer to PT evaluation(Bilateral AKA)   Cervical / Trunk Assessment Cervical / Trunk Assessment: Normal   Communication Communication Communication: No difficulties   Cognition Arousal/Alertness: Awake/alert Behavior During Therapy: WFL for tasks assessed/performed Overall Cognitive Status: No family/caregiver present to determine baseline cognitive functioning                                 General Comments: Feel pt is close to baseline function.    General Comments  SpO2 90% on RA    Exercises     Shoulder Instructions  Home Living Family/patient expects to be discharged to:: Shelter/Homeless Living Arrangements: Alone(sleeps at bus stop)                                      Prior Functioning/Environment Level of Independence: Independent with assistive device(s)(Could get everywhere with wheelchair)        Comments: Pt reports she uses public restrooms and sponge baths. Pt uses gait belt to secure trunk into w/c and prevent falls.         OT Problem List: Decreased  strength;Decreased activity tolerance;Impaired balance (sitting and/or standing);Decreased knowledge of use of DME or AE;Decreased knowledge of precautions;Pain      OT Treatment/Interventions:      OT Goals(Current goals can be found in the care plan section) Acute Rehab OT Goals Patient Stated Goal: "Go home" OT Goal Formulation: All assessment and education complete, DC therapy  OT Frequency:     Barriers to D/C:            Co-evaluation PT/OT/SLP Co-Evaluation/Treatment: Yes Reason for Co-Treatment: Other (comment)(for pain and compliance)   OT goals addressed during session: ADL's and self-care      AM-PAC PT "6 Clicks" Daily Activity     Outcome Measure Help from another person eating meals?: None Help from another person taking care of personal grooming?: None Help from another person toileting, which includes using toliet, bedpan, or urinal?: A Little Help from another person bathing (including washing, rinsing, drying)?: A Little Help from another person to put on and taking off regular upper body clothing?: None Help from another person to put on and taking off regular lower body clothing?: A Little 6 Click Score: 21   End of Session Equipment Utilized During Treatment: Other (comment)(wheelchair) Nurse Communication: Mobility status  Activity Tolerance: Patient tolerated treatment well Patient left: in bed;with call bell/phone within reach  OT Visit Diagnosis: Other abnormalities of gait and mobility (R26.89);Muscle weakness (generalized) (M62.81);Pain Pain - Right/Left: Right Pain - part of body: Leg                Time: 2119-4174 OT Time Calculation (min): 10 min Charges:  OT General Charges $OT Visit: 1 Visit OT Evaluation $OT Eval Moderate Complexity: 1 Mod  Ayiden Milliman MSOT, OTR/L Acute Rehab Pager: [phone] Office: [place] 08/02/2018, 10:32 AM

## 2018-08-02 NOTE — Care Management (Signed)
Case manager spoke with patient concerning discharge. Patient says she is homeless, wants to go to [place] [address] on [address] she will be ok.

## 2018-08-02 NOTE — Consult Note (Signed)
Consultation Note Date: 08/02/2018   Patient Name: [name]  DOB: Oct 13, 1960  MRN: [id number]  Age / Sex: 57 y.o., female  PCP: Azzie Glatter, FNP Referring Physician: Aline August, MD  Reason for Consultation: Establishing goals of care  HPI/Patient Profile: 57 y.o. female admitted on 07/30/2018 from home with complaints of stump pain and redness. She has a past medical history of cocaine abuse, grade 2 diastolic congestive heart failure, EF 60-65%, PVD s/p bilateral AKA, anemia, depression, migraines, and hypertension. Patient was also seen in ER same day of admission for abdominal pain, nausea, and vomiting. CT of abdomen was negative and patient was discharged. She later returned after found lying on bench at the bus stop in pain. Patient continues with cocaine abuse and is homeless. She has poor wound healing of bilateral AKA due to cocaine abuse. Patient had episode of increased agitation during ED course and received Haldol and was able to calm down. Orthopedics consulted after imaging showed possible evidence of osteomyelitis. No interventions recommended at this time other than covering area with guaze or bandaid and follow up outpatient. She was started on vancomycin and zosyn post cultures.She was hypertensive with blood pressure 182/114 which decreased. At baseline patient is mobile via wheelchair and able to transfer self.  Since admission patient continues on home medications. She has also been educated on cessation of cocaine abuse. Palliative Medicine team consulted for goals of care discussion.   Clinical Assessment and Goals of Care: I have reviewed medical records including lab results, imaging, Epic notes, and MAR, received report from the bedside RN, and assessed the patient. I then met at the bedside with patient to discuss diagnosis GOC, disposition and options. Patient is A&O x3. I  offered to involve other family and friends in conversation however, patient refused.   I re-introduced Palliative Medicine as specialized medical care for people living with serious illness. It focuses on providing relief from the symptoms and stress of a serious illness. The goal is to improve quality of life for both the patient and the family. Patient is familiar with our team as my colleagues have been involved in her care since 2017.   We discussed a brief life review of the patient, functional, and nutritional status.  Patient reports "there isn't much to tell except I have no family really!". She is somewhat tearful and stated "what goes around comes around." She reports that she was living with her brother at one time but he put her out because she couldn't make it to the bathroom in time and he was upset with her for peeing in his floor and on his couch. She states her life has been rough but she is hopeful for improvement.   I addressed her current cocaine abuse and correlation to current illness and co-morbidities. She verbalized understanding and stated she has tried but life keeps pulling her back down. I educated her on options such as rehab and meeting to assist with her recovery. She verbalized understanding however seemed uninterested.  She stated "It's ok and I will be ok!"   I attempted to address her current living situations and medical compliance. Patient reported she is living in between houses with friends. Her friends also are abusers of cocaine or marijuana she states. She continues to state "at least they care about her and she can have a place to stay at times and a hot meal while having fun."   Patient states she does not have an advance directive. She states she used to rely on her brother for support but they haven't been talking and on good terms so she doesn't consider him much for anything. She states "when the time comes heck just do what yall do, no one will care  anyways!" Support given and expressed that social work could assist with community resources for support. Patient declined and stated she has all the numbers and information she needs to get by.   She verbalizes she wishes to remain a full code in the event of an emergency.    Palliative Care services outpatient were explained and offered. Patient verbalized understanding, however denies services at this time stating, "I don't know where I will be from day to day and I don't need anyone calling or looking for me. They won't like my side of town anyway." Explained they would be a resource and printed information of local palliative given for he to use for future reference if she felt inclined to contact them. Patient verbalized understanding and appreciation.   Primary Decision Maker: Patient   SUMMARY OF RECOMMENDATIONS    Full Code  Continue to treat the treatable while hospitalized.   Patient is reluctant to care and this will be difficult to manage as patient continues with her cocaine abuse and noncompliance in regards to her health.   Code Status/Advance Care Planning:  Full code  Palliative Prophylaxis:   Bowel Regimen, Frequent Pain Assessment, Palliative Wound Care and Turn Reposition  Additional Recommendations (Limitations, Scope, Preferences):  Full Scope Treatment  Psycho-social/Spiritual:   Desire for further Chaplaincy support:no  Additional Recommendations: Caregiving  Support/Resources and Medicaid/Financial Assistance  Prognosis:   Unable to determine-guarded to poor in the setting of poor health interest and compliancy. Continuous cocaine abuse.   Discharge Planning: Home with Home Health      Primary Diagnoses: Present on Admission: . Ulcer of amputation stump of lower extremity ([place]) . Tobacco abuse . Protein-calorie malnutrition, severe ([place]) . Essential hypertension . Cocaine-induced vascular disorder ([place]) . Elevated LFTs . Osteomyelitis  ([place]) . Acute encephalopathy . Eye swelling, bilateral   I have reviewed the medical record, interviewed the patient and family, and examined the patient. The following aspects are pertinent.  Past Medical History:  Diagnosis Date  . CAP (community acquired pneumonia) 03/2014 X 2  . Cocaine abuse ([place])    ongoing with resultant vaculitis.  . Depression   . Headache    "weekly" (07/29/2016)  . Hypertension   . Inflammatory arthritis   . Migraines    "probably 5-6/yr" (07/29/2016)  . Normocytic anemia    BL Hgb 9.8-12. Last anemia panel 04/2010 - showing Fe 19, ferritin 101.  Pt on monthly B12 injections  . Rheumatoid arthritis(714.0)    patient reported  . 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 Followed by Dr. Louanne Skye   Social History   Socioeconomic History  . Marital status: Single    Spouse name: Not on file  .  Number of children: 0  . Years of education: 11th grade  . Highest education level: Not on file  Occupational History  . Occupation: Disability    Comment: since 2011, due to her rheumatoid arthritis  Social Needs  . Financial resource strain: Not very hard  . Food insecurity:    Worry: Sometimes true    Inability: Sometimes true  . Transportation needs:    Medical: Yes    Non-medical: Yes  Tobacco Use  . Smoking status: Current Every Day Smoker    Packs/day: 0.12    Years: 38.00    Pack years: 4.56    Types: Cigarettes  . Smokeless tobacco: Never Used  . Tobacco comment: 2 A DAY  Substance and Sexual Activity  . Alcohol use: No    Alcohol/week: 0.0 standard drinks  . Drug use: Yes    Types: "Crack" cocaine, Cocaine    Comment: Smoked crack 06/02/2017  . Sexual activity: Not Currently  Lifestyle  . Physical activity:    Days per week: 7 days    Minutes per session: Not on file  . Stress: Very much  Relationships  . Social connections:    Talks on phone: More than three times a week     Gets together: More than three times a week    Attends religious service: Never    Active member of club or organization: No    Attends meetings of clubs or organizations: Never    Relationship status: Never married  Other Topics Concern  . Not on file  Social History Narrative   Unemployed:  cleaning in past   Living at [place]; has Medicaid.   crack/cocaine use; pt denies IVDU   tobacco:  1/2 ppd, trying to quit   alcohol:  none       Family History  Problem Relation Age of Onset  . Breast cancer Mother        Breast cancer  . Alcohol abuse Mother   . Colon cancer Maternal Aunt 93  . Alcohol abuse Father    Scheduled Meds: . amLODipine  10 mg Oral Daily  . feeding supplement (ENSURE ENLIVE)  237 mL Oral TID BM  . gabapentin  100 mg Oral TID  . multivitamin with minerals  1 tablet Oral Daily   Continuous Infusions: PRN Meds:.hydrALAZINE, hydrOXYzine, ondansetron **OR** ondansetron (ZOFRAN) IV Medications Prior to Admission:  Prior to Admission medications   Medication Sig Start Date End Date Taking? Authorizing Provider  amLODipine (NORVASC) 10 MG tablet Take 1 tablet (10 mg total) by mouth daily. 06/23/18   Doreatha Lew, MD  furosemide (LASIX) 40 MG tablet Take 1 tablet (40 mg total) by mouth daily. 06/23/18   Doreatha Lew, MD  gabapentin (NEURONTIN) 100 MG capsule Take 1 capsule (100 mg total) by mouth 3 (three) times daily. 07/24/18   Quintella Reichert, MD  losartan (COZAAR) 25 MG tablet Take 1 tablet (25 mg total) by mouth daily. 06/23/18   Doreatha Lew, MD  ondansetron (ZOFRAN ODT) 4 MG disintegrating tablet Take 1 tablet (4 mg total) by mouth every 8 (eight) hours as needed for nausea or vomiting. 07/29/18   Montine Circle, PA-C  pantoprazole (PROTONIX) 40 MG tablet Take 1 tablet (40 mg total) by mouth daily. 07/14/18   Amin, Jeanella Flattery, MD  potassium chloride SA (K-DUR,KLOR-CON) 20 MEQ tablet Take 1 tablet (20 mEq total) by mouth daily for 2  days. 07/13/18 07/24/26  Damita Lack, MD  Allergies  Allergen Reactions  . Acetaminophen Swelling and Other (See Comments)    Reaction:  Eyelid swelling  . Haldol [Haloperidol Lactate]     Possible eye swelling was given together with Toradol unsure if caused the reaction  . Lisinopril     UNSPECIFIED REACTION   . Toradol [Ketorolac Tromethamine]     Eye swelling   Review of Systems  Musculoskeletal: Positive for arthralgias.       Leg pain   Neurological: Positive for weakness.  All other systems reviewed and are negative.   Physical Exam  Constitutional: She is oriented to person, place, and time. Vital signs are normal. She is cooperative. She appears ill.  Chronically ill   Cardiovascular: Normal rate, normal heart sounds and normal pulses. An irregular rhythm present.  Bilateral AKA  Pulmonary/Chest: Effort normal. She has decreased breath sounds.  Abdominal: Soft. Normal appearance and bowel sounds are normal.  Musculoskeletal:  Bilateral AKA   Neurological: She is alert and oriented to person, place, and time.  Skin: Skin is warm, dry and intact. Rash noted.  Small ulceration to stump   Psychiatric: She has a normal mood and affect. Her speech is normal and behavior is normal. Judgment and thought content normal. Cognition and memory are normal.  Nursing note and vitals reviewed.   Vital Signs: BP (!) 144/85 (BP Location: Right Arm)   Pulse 100   Temp 98.8 F (37.1 C) (Oral)   Resp 14   Ht 5' 1"  (1.549 m)   Wt 46.7 kg   SpO2 100%   BMI 19.45 kg/m  Pain Scale: 0-10   Pain Score: 2    SpO2: SpO2: 100 % O2 Device:SpO2: 100 % O2 Flow Rate: .O2 Flow Rate (L/min): 3 L/min  IO: Intake/output summary:   Intake/Output Summary (Last 24 hours) at 08/02/2018 1445 Last data filed at 08/02/2018 0500 Gross per 24 hour  Intake 100 ml  Output 650 ml  Net -550 ml    LBM: Last BM Date: 07/30/18 Baseline Weight: Weight: 46.7 kg Most recent weight: Weight:  46.7 kg     Palliative Assessment/Data: PPS 40%   Time In: 1315 Time Out: 1420 Time Total: 65 min.   Greater than 50%  of this time was spent counseling and coordinating care related to the above assessment and plan.  Signed by:  Alda Lea, AGPCNP-BC Palliative Medicine Team  Phone: [phone] Fax: [phone] Pager: [phone] Amion: Bjorn Pippin    Please contact Palliative Medicine Team phone at [phone] for questions and concerns.  For individual provider: See Shea Evans

## 2018-08-03 ENCOUNTER — Ambulatory Visit: Payer: Self-pay | Admitting: Family Medicine

## 2018-08-03 LAB — HEPATITIS PANEL, ACUTE
HCV Ab: <0.1 s/co ratio (ref 0.0–0.9)
Hep A IgM: NEGATIVE
Hep B C IgM: NEGATIVE
Hepatitis B Surface Ag: NEGATIVE

## 2018-08-05 LAB — CULTURE, BLOOD (ROUTINE X 2)
CULTURE: NO GROWTH
Culture: NO GROWTH
Special Requests: ADEQUATE
Special Requests: ADEQUATE

## 2018-08-09 ENCOUNTER — Emergency Department (HOSPITAL_COMMUNITY): Payer: Medicaid Other

## 2018-08-09 ENCOUNTER — Encounter (HOSPITAL_COMMUNITY): Payer: Self-pay

## 2018-08-09 ENCOUNTER — Emergency Department (HOSPITAL_COMMUNITY)
Admission: EM | Admit: 2018-08-09 | Discharge: 2018-08-09 | Disposition: A | Discharge status: Home / Self Care | Payer: Medicaid Other | Attending: Emergency Medicine | Admitting: Emergency Medicine

## 2018-08-09 DIAGNOSIS — I509 Heart failure, unspecified: Secondary | ICD-10-CM | POA: Diagnosis not present

## 2018-08-09 DIAGNOSIS — Z79899 Other long term (current) drug therapy: Secondary | ICD-10-CM | POA: Insufficient documentation

## 2018-08-09 DIAGNOSIS — M79651 Pain in right thigh: Secondary | ICD-10-CM | POA: Diagnosis not present

## 2018-08-09 DIAGNOSIS — Z89611 Acquired absence of right leg above knee: Secondary | ICD-10-CM | POA: Diagnosis not present

## 2018-08-09 DIAGNOSIS — F17210 Nicotine dependence, cigarettes, uncomplicated: Secondary | ICD-10-CM | POA: Diagnosis not present

## 2018-08-09 DIAGNOSIS — Z89612 Acquired absence of left leg above knee: Secondary | ICD-10-CM | POA: Diagnosis not present

## 2018-08-09 DIAGNOSIS — I110 Hypertensive heart disease with heart failure: Secondary | ICD-10-CM | POA: Insufficient documentation

## 2018-08-09 MED ORDER — IBUPROFEN 200 MG PO TABS
600.0000 mg | ORAL_TABLET | Freq: Once | ORAL | Status: AC
  Administered 2018-08-09: 600 mg via ORAL
  Filled 2018-08-09: qty 3

## 2018-08-09 NOTE — ED Notes (Signed)
[facility] call to transport the patient back home.

## 2018-08-09 NOTE — ED Notes (Signed)
Bed: WA01 Expected date:  Expected time:  Means of arrival:  Comments: EMS- 57yo F, leg pain/bilateral BKAs

## 2018-08-09 NOTE — Discharge Instructions (Addendum)
Please call your orthopedic surgeon for follow-up this week

## 2018-08-09 NOTE — ED Triage Notes (Signed)
Patient presented to ed with c/o pain to bilateral AKA amputation site.

## 2018-08-09 NOTE — ED Provider Notes (Signed)
[facility] DEPT Provider Note   CSN: [id number] Arrival date & time: 08/09/18  1007     History   Chief Complaint No chief complaint on file.   HPI [name] is a 57 y.o. female.  HPI 56 year old female presents the emergency department with right above-the-knee amputation stump pain.  She states this pain is been present for several days.  She was recently hospitalized for possible osteomyelitis of the left leg which was deemed to not be osteo-per her orthopedic surgeon Dr. due to.  She has not called her orthopedist regarding her stump pain.  No drainage.  She is been treating a small ulcer with a Band-Aid.  No fevers or chills.  No erythema.  No drainage.  Pain is moderate in severity.  She has ongoing cocaine abuse.  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   Past Medical History:  Diagnosis Date  . CAP (community acquired pneumonia) 03/2014 X 2  . Cocaine abuse ([place])    ongoing with resultant vaculitis.  . Depression   . Headache    "weekly" (07/29/2016)  . Hypertension   . Inflammatory arthritis   . Migraines    "probably 5-6/yr" (07/29/2016)  . Normocytic anemia    BL Hgb 9.8-12. Last anemia panel 04/2010 - showing Fe 19, ferritin 101.  Pt on monthly B12 injections  . Rheumatoid arthritis(714.0)    patient reported  . 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 Followed by Dr. Louanne Skye    Patient Active Problem List   Diagnosis Date Noted  . Ulcer of right thigh, limited to breakdown of skin ([place])   . S/P bilateral above knee amputation ([place])   . Homelessness 07/30/2018  . Elevated LFTs 07/30/2018  . Osteomyelitis ([place]) 07/30/2018  . Acute encephalopathy 07/30/2018  . Eye swelling, bilateral 07/30/2018  . Skin rash 07/11/2018  . Acute on chronic diastolic CHF (congestive heart failure) ([place]) 06/21/2018  . Malnutrition of moderate degree 05/20/2018  . Acute  congestive heart failure ([place])   . Suicidal ideation 02/02/2018  . HCAP (healthcare-associated pneumonia) 02/02/2018  . Acute on chronic respiratory failure with hypoxia ([place]) 02/02/2018  . Sepsis ([place]) 02/02/2018  . Ulcer of amputation stump of lower extremity ([place]) 10/23/2017  . Cocaine abuse with cocaine-induced mood disorder ([place]) 08/10/2017  . Hypertensive crisis   . Phantom limb pain ([place])   . S/P bilateral below knee amputation ([place]) 04/13/2017  . Tobacco abuse   . Post-operative pain   . Acute blood loss anemia   . 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 Wound infection 03/30/2017  . AKI (acute kidney injury) ([place]) 02/07/2017  . Acute kidney injury ([place]) 02/06/2017  . Wound healing, delayed   . Chest pain 04/07/2015  . Protein-calorie malnutrition, severe ([place]) 01/15/2015  . MDD (major depressive disorder), recurrent episode, severe ([place]) 10/16/2014  . Cocaine use disorder, severe, dependence ([place]) 10/10/2014  . 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 Essential hypertension 02/26/2010    Past Surgical History:  Procedure Laterality Date  . AMPUTATION Left 05/22/2016   Procedure: AMPUTATION LEFT LONG FINGER;  Surgeon: Marybelle Killings, MD;  Location: [facility];  Service: Orthopedics;  Laterality: Left;  . AMPUTATION Bilateral 04/10/2017   Procedure: AMPUTATION BELOW KNEE;  Surgeon: Newt Minion, MD;  Location: [facility];  Service: Orthopedics;  Laterality: Bilateral;  . AMPUTATION Bilateral  02/06/2018   Procedure: AMPUTATION ABOVE KNEE;  Surgeon: Newt Minion, MD;  Location: [facility];  Service: Orthopedics;  Laterality: Bilateral;  . HERNIA REPAIR     "stomach"  . I&D EXTREMITY Right 09/26/2015   Procedure: IRRIGATION AND DEBRIDEMENT LEG WOUND  VAC PLACEMENT.;  Surgeon: Loel Lofty Dillingham, DO;  Location: [facility];  Service: Plastics;  Laterality: Right;  . INCISION AND DRAINAGE OF WOUND Bilateral 10/20/2016   Procedure:  IRRIGATION AND DEBRIDEMENT WOUND BILATERAL;  Surgeon: Edrick Kins, DPM;  Location: [facility];  Service: Podiatry;  Laterality: Bilateral;  . IRRIGATION AND DEBRIDEMENT ABSCESS Bilateral 09/26/2013   Procedure: DEBRIDEMENT ULCERS BILATERAL THIGHS;  Surgeon: Gwenyth Ober, MD;  Location: [facility];  Service: General;  Laterality: Bilateral;  . SKIN BIOPSY Bilateral    shin nodules     OB History   None      Home Medications    Prior to Admission medications   Medication Sig Start Date End Date Taking? Authorizing Provider  amLODipine (NORVASC) 10 MG tablet Take 1 tablet (10 mg total) by mouth daily. 06/23/18   Doreatha Lew, MD  furosemide (LASIX) 40 MG tablet Take 1 tablet (40 mg total) by mouth daily. 06/23/18   Doreatha Lew, MD  gabapentin (NEURONTIN) 100 MG capsule Take 1 capsule (100 mg total) by mouth 3 (three) times daily. 07/24/18   Quintella Reichert, MD  losartan (COZAAR) 25 MG tablet Take 1 tablet (25 mg total) by mouth daily. 06/23/18   Doreatha Lew, MD  ondansetron (ZOFRAN ODT) 4 MG disintegrating tablet Take 1 tablet (4 mg total) by mouth every 8 (eight) hours as needed for nausea or vomiting. 07/29/18   Montine Circle, PA-C  pantoprazole (PROTONIX) 40 MG tablet Take 1 tablet (40 mg total) by mouth daily. 07/14/18   Damita Lack, MD    Family History Family History  Problem Relation Age of Onset  . Breast cancer Mother        Breast cancer  . Alcohol abuse Mother   . Colon cancer Maternal Aunt 79  . Alcohol abuse Father     Social History Social History   Tobacco Use  . Smoking status: Current Every Day Smoker    Packs/day: 0.12    Years: 38.00    Pack years: 4.56    Types: Cigarettes  . Smokeless tobacco: Never Used  . Tobacco comment: 2 A DAY  Substance Use Topics  . Alcohol use: No    Alcohol/week: 0.0 standard drinks  . Drug use: Yes    Types: "Crack" cocaine, Cocaine    Comment: Smoked crack 06/02/2017     Allergies     Acetaminophen; Haldol [haloperidol lactate]; Lisinopril; and Toradol [ketorolac tromethamine]   Review of Systems Review of Systems  All other systems reviewed and are negative.    Physical Exam Updated Vital Signs BP 134/88   Pulse 100   Temp 98.3 F (36.8 C)   Resp 18   SpO2 98%   Physical Exam  Constitutional: She is oriented to person, place, and time. She appears well-developed and well-nourished.  HENT:  Head: Normocephalic.  Eyes: EOM are normal.  Neck: Normal range of motion.  Pulmonary/Chest: Effort normal.  Abdominal: She exhibits no distension.  Musculoskeletal: Normal range of motion.  Bilateral above-knee amputation stumps without secondary signs of infection.  Well-healed incisions.  Small ulceration on the medial aspect of the right stump without drainage or surrounding erythema or warmth.  Neurological: She  is alert and oriented to person, place, and time.  Psychiatric: She has a normal mood and affect.  Nursing note and vitals reviewed.    ED Treatments / Results  Labs (all labs ordered are listed, but only abnormal results are displayed) Labs Reviewed - No data to display  EKG None  Radiology Dg Femur Min 2 Views Right  Result Date: 08/09/2018 CLINICAL DATA:  Stump pain with skin breakdown EXAM: RIGHT FEMUR 2 VIEWS COMPARISON:  07/30/2018 radiography. FINDINGS: Above the knee amputation with stable overlying thin soft tissue flap. No opaque foreign body or soft tissue gas. Heterotopic ossification about the osteotomy superimposed mild periosteal reaction has occurred since prior. There is osteopenia of the stump that is stable. IMPRESSION: No definite acute finding or osteomyelitis. There is subtle periosteal reaction that has occurred since 07/30/2018, a MRI could be obtained if there is ongoing concern for osteomyelitis. Electronically Signed   By: Monte Fantasia M.D.   On: 08/09/2018 11:14    Procedures Procedures (including critical care  time)  Medications Ordered in ED Medications  ibuprofen (ADVIL,MOTRIN) tablet 600 mg (600 mg Oral Given 08/09/18 1119)     Initial Impression / Assessment and Plan / ED Course  I have reviewed the triage vital signs and the nursing notes.  Pertinent labs & imaging results that were available during my care of the patient were reviewed by me and considered in my medical decision making (see chart for details).     Possible early subtle changes of the right distal femur.  This is best evaluated as an outpatient by her orthopedic surgeon.  MRI could be obtained as an outpatient if he is concerned.  At this time with normal vital signs and no fever and no external signs of infection I do not think she needs additional work-up emergently in the emergency department today.  I do not think she needs acute hospitalization.  She understands the importance of close follow-up with her orthopedic surgeon.  Final Clinical Impressions(s) / ED Diagnoses   Final diagnoses:  Acute pain of right thigh    ED Discharge Orders    None       Jola Schmidt, MD 08/09/18 1131

## 2018-08-11 ENCOUNTER — Other Ambulatory Visit: Payer: Self-pay

## 2018-08-11 ENCOUNTER — Emergency Department (HOSPITAL_COMMUNITY)
Admission: EM | Admit: 2018-08-11 | Discharge: 2018-08-11 | Disposition: A | Discharge status: Home / Self Care | Payer: Medicaid Other | Attending: Emergency Medicine | Admitting: Emergency Medicine

## 2018-08-11 ENCOUNTER — Encounter (HOSPITAL_COMMUNITY): Payer: Self-pay

## 2018-08-11 DIAGNOSIS — R1013 Epigastric pain: Secondary | ICD-10-CM | POA: Insufficient documentation

## 2018-08-11 DIAGNOSIS — Z79899 Other long term (current) drug therapy: Secondary | ICD-10-CM | POA: Insufficient documentation

## 2018-08-11 DIAGNOSIS — R17 Unspecified jaundice: Secondary | ICD-10-CM

## 2018-08-11 DIAGNOSIS — R112 Nausea with vomiting, unspecified: Secondary | ICD-10-CM | POA: Insufficient documentation

## 2018-08-11 DIAGNOSIS — I110 Hypertensive heart disease with heart failure: Secondary | ICD-10-CM | POA: Insufficient documentation

## 2018-08-11 DIAGNOSIS — F17210 Nicotine dependence, cigarettes, uncomplicated: Secondary | ICD-10-CM | POA: Insufficient documentation

## 2018-08-11 DIAGNOSIS — I5033 Acute on chronic diastolic (congestive) heart failure: Secondary | ICD-10-CM | POA: Insufficient documentation

## 2018-08-11 DIAGNOSIS — D649 Anemia, unspecified: Secondary | ICD-10-CM | POA: Insufficient documentation

## 2018-08-11 DIAGNOSIS — T8789 Other complications of amputation stump: Secondary | ICD-10-CM

## 2018-08-11 DIAGNOSIS — M79605 Pain in left leg: Secondary | ICD-10-CM

## 2018-08-11 LAB — COMPREHENSIVE METABOLIC PANEL
ALBUMIN: 2.9 g/dL — AB (ref 3.5–5.0)
ALK PHOS: 90 U/L (ref 38–126)
ALT: 39 U/L (ref 0–44)
ANION GAP: 13 (ref 5–15)
AST: 32 U/L (ref 15–41)
BILIRUBIN TOTAL: 2.9 mg/dL — AB (ref 0.3–1.2)
BUN: 21 mg/dL — AB (ref 6–20)
CALCIUM: 8.7 mg/dL — AB (ref 8.9–10.3)
CO2: 16 mmol/L — ABNORMAL LOW (ref 22–32)
Chloride: 101 mmol/L (ref 98–111)
Creatinine, Ser: 0.97 mg/dL (ref 0.44–1.00)
GFR calc Af Amer: >60 mL/min (ref >60)
GLUCOSE: 100 mg/dL — AB (ref 70–99)
Potassium: 3.7 mmol/L (ref 3.5–5.1)
Sodium: 130 mmol/L — ABNORMAL LOW (ref 135–145)
TOTAL PROTEIN: 6.1 g/dL — AB (ref 6.5–8.1)

## 2018-08-11 LAB — CBC WITH DIFFERENTIAL/PLATELET
Abs Immature Granulocytes: 0.08 10*3/uL — ABNORMAL HIGH (ref 0.00–0.07)
Basophils Absolute: 0 10*3/uL (ref 0.0–0.1)
Basophils Relative: 0 %
EOS PCT: 0 %
Eosinophils Absolute: 0 10*3/uL (ref 0.0–0.5)
HEMATOCRIT: 32.3 % — AB (ref 36.0–46.0)
Hemoglobin: 10.2 g/dL — ABNORMAL LOW (ref 12.0–15.0)
Immature Granulocytes: 1 %
LYMPHS ABS: 2.3 10*3/uL (ref 0.7–4.0)
Lymphocytes Relative: 21 %
MCH: 25.8 pg — AB (ref 26.0–34.0)
MCHC: 31.6 g/dL (ref 30.0–36.0)
MCV: 81.8 fL (ref 80.0–100.0)
MONO ABS: 0.6 10*3/uL (ref 0.1–1.0)
MONOS PCT: 6 %
NRBC: 0 % (ref 0.0–0.2)
Neutro Abs: 7.6 10*3/uL (ref 1.7–7.7)
Neutrophils Relative %: 72 %
Platelets: 335 10*3/uL (ref 150–400)
RBC: 3.95 MIL/uL (ref 3.87–5.11)
RDW: 19.1 % — ABNORMAL HIGH (ref 11.5–15.5)
WBC: 10.7 10*3/uL — ABNORMAL HIGH (ref 4.0–10.5)

## 2018-08-11 LAB — LIPASE, BLOOD: LIPASE: 25 U/L (ref 11–51)

## 2018-08-11 MED ORDER — SODIUM CHLORIDE 0.9 % IV BOLUS
1000.0000 mL | Freq: Once | INTRAVENOUS | Status: AC
  Administered 2018-08-11: 1000 mL via INTRAVENOUS

## 2018-08-11 MED ORDER — ONDANSETRON HCL 4 MG/2ML IJ SOLN
4.0000 mg | Freq: Once | INTRAMUSCULAR | Status: AC
  Administered 2018-08-11: 4 mg via INTRAVENOUS
  Filled 2018-08-11: qty 2

## 2018-08-11 MED ORDER — ONDANSETRON HCL 4 MG PO TABS: 4.0000 mg | ORAL_TABLET | Freq: Four times a day (QID) | ORAL | 0 refills | Status: DC | PRN

## 2018-08-11 MED ORDER — OXYCODONE HCL 5 MG PO TABS
5.0000 mg | ORAL_TABLET | Freq: Once | ORAL | Status: AC
  Administered 2018-08-11: 5 mg via ORAL
  Filled 2018-08-11: qty 1

## 2018-08-11 MED ORDER — HYDROMORPHONE HCL 1 MG/ML IJ SOLN
2.0000 mg | Freq: Once | INTRAMUSCULAR | Status: AC
  Administered 2018-08-11: 2 mg via INTRAMUSCULAR
  Filled 2018-08-11: qty 2

## 2018-08-11 MED ORDER — PANTOPRAZOLE SODIUM 40 MG PO TBEC: 40.0000 mg | DELAYED_RELEASE_TABLET | Freq: Every day | ORAL | 0 refills | Status: DC

## 2018-08-11 MED ORDER — PANTOPRAZOLE SODIUM 40 MG PO TBEC
40.0000 mg | DELAYED_RELEASE_TABLET | Freq: Once | ORAL | Status: AC
  Administered 2018-08-11: 40 mg via ORAL
  Filled 2018-08-11: qty 1

## 2018-08-11 MED ORDER — ALUM & MAG HYDROXIDE-SIMETH 200-200-20 MG/5ML PO SUSP
30.0000 mL | Freq: Once | ORAL | Status: AC
  Administered 2018-08-11: 30 mL via ORAL
  Filled 2018-08-11: qty 30

## 2018-08-11 MED ORDER — PROMETHAZINE HCL 25 MG/ML IJ SOLN: 25.0000 mg | Freq: Once | INTRAMUSCULAR | Status: DC

## 2018-08-11 NOTE — ED Triage Notes (Signed)
Pt BIB [facility] for eval of N/V onset earlier this evening. Pt states she at a cheeseburger and threw it back up. No episodes of emesis for EMS. Pt also report blt stump pain for which she was recently seen here for. +Crack use this evening, homless, non compliant w/ meedications, hx DMII

## 2018-08-11 NOTE — ED Provider Notes (Signed)
[facility] EMERGENCY DEPARTMENT Provider Note   CSN: [id number] Arrival date & time: 08/11/18  0025     History   Chief Complaint Chief Complaint  Patient presents with  . Nausea    HPI [name] is a 57 y.o. female.  The history is provided by the patient.  She has history of hypertension, rheumatoid arthritis, cocaine abuse and comes in because of nausea and vomiting and abdominal pain.  She had been in the emergency department yesterday for right thigh pain and was supposed to follow-up with her orthopedic physician.  Tonight, she got sick after eating a cheeseburger and vomited.  She is complaining of epigastric pain which she rates at 8/10.  She is also complaining of ongoing nausea.  She does admit to cocaine use tonight.  She is also complaining of some itching in the inguinal area.  She is status post bilateral above-the-knee amputation, and is complaining of pain in both stumps.  Past Medical History:  Diagnosis Date  . CAP (community acquired pneumonia) 03/2014 X 2  . Cocaine abuse ([place])    ongoing with resultant vaculitis.  . Depression   . Headache    "weekly" (07/29/2016)  . Hypertension   . Inflammatory arthritis   . Migraines    "probably 5-6/yr" (07/29/2016)  . Normocytic anemia    BL Hgb 9.8-12. Last anemia panel 04/2010 - showing Fe 19, ferritin 101.  Pt on monthly B12 injections  . Rheumatoid arthritis(714.0)    patient reported  . 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 Followed by Dr. Louanne Skye    Patient Active Problem List   Diagnosis Date Noted  . Ulcer of right thigh, limited to breakdown of skin ([place])   . S/P bilateral above knee amputation ([place])   . Homelessness 07/30/2018  . Elevated LFTs 07/30/2018  . Osteomyelitis ([place]) 07/30/2018  . Acute encephalopathy 07/30/2018  . Eye swelling, bilateral 07/30/2018  . Skin rash 07/11/2018  . Acute on chronic diastolic CHF  (congestive heart failure) ([place]) 06/21/2018  . Malnutrition of moderate degree 05/20/2018  . Acute congestive heart failure ([place])   . Suicidal ideation 02/02/2018  . HCAP (healthcare-associated pneumonia) 02/02/2018  . Acute on chronic respiratory failure with hypoxia ([place]) 02/02/2018  . Sepsis ([place]) 02/02/2018  . Ulcer of amputation stump of lower extremity ([place]) 10/23/2017  . Cocaine abuse with cocaine-induced mood disorder ([place]) 08/10/2017  . Hypertensive crisis   . Phantom limb pain ([place])   . S/P bilateral below knee amputation ([place]) 04/13/2017  . Tobacco abuse   . Post-operative pain   . Acute blood loss anemia   . 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 Wound infection 03/30/2017  . AKI (acute kidney injury) ([place]) 02/07/2017  . Acute kidney injury ([place]) 02/06/2017  . Wound healing, delayed   . Chest pain 04/07/2015  . Protein-calorie malnutrition, severe ([place]) 01/15/2015  . MDD (major depressive disorder), recurrent episode, severe ([place]) 10/16/2014  . Cocaine use disorder, severe, dependence ([place]) 10/10/2014  . 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 Essential hypertension 02/26/2010    Past Surgical History:  Procedure Laterality Date  . AMPUTATION Left 05/22/2016   Procedure: AMPUTATION LEFT LONG FINGER;  Surgeon: Marybelle Killings, MD;  Location: [facility];  Service: Orthopedics;  Laterality: Left;  . AMPUTATION Bilateral 04/10/2017   Procedure: AMPUTATION BELOW KNEE;  Surgeon: Newt Minion, MD;  Location: [facility];  Service: Orthopedics;  Laterality: Bilateral;  . AMPUTATION Bilateral 02/06/2018   Procedure: AMPUTATION ABOVE KNEE;  Surgeon: Newt Minion, MD;  Location: [facility];  Service: Orthopedics;  Laterality: Bilateral;  . HERNIA REPAIR     "stomach"  . I&D EXTREMITY Right 09/26/2015   Procedure: IRRIGATION AND DEBRIDEMENT LEG WOUND  VAC PLACEMENT.;  Surgeon: Loel Lofty Dillingham, DO;  Location: [facility];  Service:  Plastics;  Laterality: Right;  . INCISION AND DRAINAGE OF WOUND Bilateral 10/20/2016   Procedure: IRRIGATION AND DEBRIDEMENT WOUND BILATERAL;  Surgeon: Edrick Kins, DPM;  Location: [facility];  Service: Podiatry;  Laterality: Bilateral;  . IRRIGATION AND DEBRIDEMENT ABSCESS Bilateral 09/26/2013   Procedure: DEBRIDEMENT ULCERS BILATERAL THIGHS;  Surgeon: Gwenyth Ober, MD;  Location: [facility];  Service: General;  Laterality: Bilateral;  . SKIN BIOPSY Bilateral    shin nodules     OB History   None      Home Medications    Prior to Admission medications   Medication Sig Start Date End Date Taking? Authorizing Provider  amLODipine (NORVASC) 10 MG tablet Take 1 tablet (10 mg total) by mouth daily. 06/23/18   Doreatha Lew, MD  furosemide (LASIX) 40 MG tablet Take 1 tablet (40 mg total) by mouth daily. 06/23/18   Doreatha Lew, MD  gabapentin (NEURONTIN) 100 MG capsule Take 1 capsule (100 mg total) by mouth 3 (three) times daily. 07/24/18   Quintella Reichert, MD  losartan (COZAAR) 25 MG tablet Take 1 tablet (25 mg total) by mouth daily. 06/23/18   Doreatha Lew, MD  ondansetron (ZOFRAN ODT) 4 MG disintegrating tablet Take 1 tablet (4 mg total) by mouth every 8 (eight) hours as needed for nausea or vomiting. 07/29/18   Montine Circle, PA-C  pantoprazole (PROTONIX) 40 MG tablet Take 1 tablet (40 mg total) by mouth daily. 07/14/18   Damita Lack, MD    Family History Family History  Problem Relation Age of Onset  . Breast cancer Mother        Breast cancer  . Alcohol abuse Mother   . Colon cancer Maternal Aunt 42  . Alcohol abuse Father     Social History Social History   Tobacco Use  . Smoking status: Current Every Day Smoker    Packs/day: 0.12    Years: 38.00    Pack years: 4.56    Types: Cigarettes  . Smokeless tobacco: Never Used  . Tobacco comment: 2 A DAY  Substance Use Topics  . Alcohol use: No    Alcohol/week: 0.0 standard drinks  . Drug use: Yes     Types: "Crack" cocaine, Cocaine    Comment: Smoked crack 06/02/2017     Allergies   Acetaminophen; Haldol [haloperidol lactate]; Lisinopril; and Toradol [ketorolac tromethamine]   Review of Systems Review of Systems  All other systems reviewed and are negative.    Physical Exam Updated Vital Signs BP (!) 171/121   Pulse 95   Temp (!) 97.4 F (36.3 C) (Axillary)   Resp 18   Ht 5' 1"  (1.549 m)   Wt 47 kg   SpO2 100%   BMI 19.58 kg/m   Physical Exam  Nursing note and vitals reviewed.  57 year old female, resting comfortably and in no acute distress. Vital signs are significant for elevated blood pressure. Oxygen saturation is 100%, which is normal. Head is normocephalic and atraumatic. PERRLA, EOMI. Oropharynx is clear. Neck is nontender and supple without  adenopathy or JVD. Back is nontender and there is no CVA tenderness. Lungs are clear without rales, wheezes, or rhonchi. Chest is nontender. Heart has regular rate and rhythm with 2/6 systolic ejection murmur heard along the left sternal border and at the cardiac apex. Abdomen is soft, flat, with moderate epigastric tenderness.  There is no rebound or guarding.  There are no masses or hepatosplenomegaly and peristalsis is hypoactive. Extremities: Bilateral above-the-knee amputations.  Ulcerations noted on both stumps. Skin is warm and dry.  Erythema noted in the inguinal folds bilaterally consistent with monilia. Neurologic: Mental status is normal, cranial nerves are intact, there are no motor or sensory deficits.  ED Treatments / Results  Labs (all labs ordered are listed, but only abnormal results are displayed) Labs Reviewed  COMPREHENSIVE METABOLIC PANEL - Abnormal; Notable for the following components:      Result Value   Sodium 130 (*)    CO2 16 (*)    Glucose, Bld 100 (*)    BUN 21 (*)    Calcium 8.7 (*)    Total Protein 6.1 (*)    Albumin 2.9 (*)    Total Bilirubin 2.9 (*)    All other components  within normal limits  CBC WITH DIFFERENTIAL/PLATELET - Abnormal; Notable for the following components:   WBC 10.7 (*)    Hemoglobin 10.2 (*)    HCT 32.3 (*)    MCH 25.8 (*)    RDW 19.1 (*)    Abs Immature Granulocytes 0.08 (*)    All other components within normal limits  LIPASE, BLOOD   Procedures Procedures   Medications Ordered in ED Medications  ondansetron (ZOFRAN) injection 4 mg (4 mg Intravenous Given 08/11/18 0142)  sodium chloride 0.9 % bolus 1,000 mL (0 mLs Intravenous Stopped 08/11/18 0257)  alum & mag hydroxide-simeth (MAALOX/MYLANTA) 200-200-20 MG/5ML suspension 30 mL (30 mLs Oral Given 08/11/18 0142)  pantoprazole (PROTONIX) EC tablet 40 mg (40 mg Oral Given 08/11/18 0205)  oxyCODONE (Oxy IR/ROXICODONE) immediate release tablet 5 mg (5 mg Oral Given 08/11/18 0205)  oxyCODONE (Oxy IR/ROXICODONE) immediate release tablet 5 mg (5 mg Oral Given 08/11/18 0319)  HYDROmorphone (DILAUDID) injection 2 mg (2 mg Intramuscular Given 08/11/18 0433)     Initial Impression / Assessment and Plan / ED Course  I have reviewed the triage vital signs and the nursing notes.  Pertinent labs & imaging results that were available during my care of the patient were reviewed by me and considered in my medical decision making (see chart for details).  Epigastric pain, nausea, vomiting.  Possible gastritis, possible peptic ulcer disease, possible pancreatitis.  Old records are reviewed, and she had a recent right upper quadrant ultrasound which showed no gallstones and a negative HIDA scan as well as a recent CT scan of abdomen and pelvis significant only for moderate ascites.  We will repeat screening labs.  She has had elevation of transaminases recently.  We will give IV fluids, ondansetron and dose of oral Maalox.  Stomach is feeling better following above-noted treatment, but complaining of pain in her stumps.  She is given a dose of oxycodone.  Labs show improvement in transaminases, but bilirubin  has risen slightly.  Also, CO2 has dropped to 16, but with normal anion gap.  She has been noted to have low CO2 in the past.  IV fluid boluses in process.  Patient complains of pain in her stump which was severe.  She is given 2 doses of morphine with no  relief.  She is given an injection of hydromorphone which has given her relief.  She more be referred back to her orthopedic physician for management of her stump issues.  It is noted that patient has been noncompliant with medications.  She was supposed to be on pantoprazole.  Patient is advised that she needs to take pantoprazole every day and is given a new prescription for as well as prescription for ondansetron.  Final Clinical Impressions(s) / ED Diagnoses   Final diagnoses:  Non-intractable vomiting with nausea, unspecified vomiting type  Epigastric pain  Pain of amputation stump of left lower extremity (HCC)  Serum total bilirubin elevated  Normocytic anemia    ED Discharge Orders         Ordered    pantoprazole (PROTONIX) 40 MG tablet  Daily     08/11/18 0546    ondansetron (ZOFRAN) 4 MG tablet  Every 6 hours PRN     08/11/18 0301           Delora Fuel, MD 31/43/88 [phone]

## 2018-08-11 NOTE — ED Notes (Signed)
RN attempted twice to gain IV access, unsuccessful.

## 2018-08-11 NOTE — Discharge Instructions (Signed)
You must take the pantoprazole (Protonix) every day if you don't want to have more problems with stomach pain and nausea.

## 2018-08-13 ENCOUNTER — Emergency Department (HOSPITAL_COMMUNITY): Payer: Medicaid Other

## 2018-08-13 ENCOUNTER — Inpatient Hospital Stay (HOSPITAL_COMMUNITY)
Admission: EM | Admit: 2018-08-13 | Discharge: 2018-08-26 | DRG: 871 | Disposition: A | Discharge status: Skilled Nursing Facility | Payer: Medicaid Other | Attending: Internal Medicine | Admitting: Internal Medicine

## 2018-08-13 ENCOUNTER — Encounter (HOSPITAL_COMMUNITY): Payer: Self-pay | Admitting: Emergency Medicine

## 2018-08-13 ENCOUNTER — Inpatient Hospital Stay (HOSPITAL_COMMUNITY): Payer: Medicaid Other

## 2018-08-13 DIAGNOSIS — Z5900 Homelessness unspecified: Secondary | ICD-10-CM

## 2018-08-13 DIAGNOSIS — Z811 Family history of alcohol abuse and dependence: Secondary | ICD-10-CM

## 2018-08-13 DIAGNOSIS — J9601 Acute respiratory failure with hypoxia: Secondary | ICD-10-CM | POA: Diagnosis present

## 2018-08-13 DIAGNOSIS — I5082 Biventricular heart failure: Secondary | ICD-10-CM | POA: Diagnosis present

## 2018-08-13 DIAGNOSIS — Z66 Do not resuscitate: Secondary | ICD-10-CM | POA: Diagnosis present

## 2018-08-13 DIAGNOSIS — R945 Abnormal results of liver function studies: Secondary | ICD-10-CM | POA: Diagnosis not present

## 2018-08-13 DIAGNOSIS — Z515 Encounter for palliative care: Secondary | ICD-10-CM | POA: Diagnosis not present

## 2018-08-13 DIAGNOSIS — I70263 Atherosclerosis of native arteries of extremities with gangrene, bilateral legs: Secondary | ICD-10-CM | POA: Diagnosis present

## 2018-08-13 DIAGNOSIS — Z89511 Acquired absence of right leg below knee: Secondary | ICD-10-CM | POA: Diagnosis not present

## 2018-08-13 DIAGNOSIS — M064 Inflammatory polyarthropathy: Secondary | ICD-10-CM | POA: Diagnosis present

## 2018-08-13 DIAGNOSIS — F329 Major depressive disorder, single episode, unspecified: Secondary | ICD-10-CM | POA: Diagnosis present

## 2018-08-13 DIAGNOSIS — I999 Unspecified disorder of circulatory system: Secondary | ICD-10-CM | POA: Diagnosis not present

## 2018-08-13 DIAGNOSIS — I5023 Acute on chronic systolic (congestive) heart failure: Secondary | ICD-10-CM | POA: Diagnosis not present

## 2018-08-13 DIAGNOSIS — G8929 Other chronic pain: Secondary | ICD-10-CM | POA: Diagnosis present

## 2018-08-13 DIAGNOSIS — B999 Unspecified infectious disease: Secondary | ICD-10-CM

## 2018-08-13 DIAGNOSIS — I10 Essential (primary) hypertension: Secondary | ICD-10-CM | POA: Diagnosis not present

## 2018-08-13 DIAGNOSIS — E876 Hypokalemia: Secondary | ICD-10-CM | POA: Diagnosis present

## 2018-08-13 DIAGNOSIS — R740 Nonspecific elevation of levels of transaminase and lactic acid dehydrogenase [LDH]: Secondary | ICD-10-CM

## 2018-08-13 DIAGNOSIS — Z89512 Acquired absence of left leg below knee: Secondary | ICD-10-CM

## 2018-08-13 DIAGNOSIS — K761 Chronic passive congestion of liver: Secondary | ICD-10-CM | POA: Diagnosis present

## 2018-08-13 DIAGNOSIS — R6520 Severe sepsis without septic shock: Secondary | ICD-10-CM | POA: Diagnosis present

## 2018-08-13 DIAGNOSIS — E872 Acidosis: Secondary | ICD-10-CM | POA: Diagnosis present

## 2018-08-13 DIAGNOSIS — I2729 Other secondary pulmonary hypertension: Secondary | ICD-10-CM | POA: Diagnosis present

## 2018-08-13 DIAGNOSIS — Z89612 Acquired absence of left leg above knee: Secondary | ICD-10-CM | POA: Diagnosis not present

## 2018-08-13 DIAGNOSIS — I110 Hypertensive heart disease with heart failure: Secondary | ICD-10-CM | POA: Diagnosis present

## 2018-08-13 DIAGNOSIS — R9389 Abnormal findings on diagnostic imaging of other specified body structures: Secondary | ICD-10-CM | POA: Diagnosis not present

## 2018-08-13 DIAGNOSIS — A419 Sepsis, unspecified organism: Principal | ICD-10-CM | POA: Diagnosis present

## 2018-08-13 DIAGNOSIS — D689 Coagulation defect, unspecified: Secondary | ICD-10-CM | POA: Diagnosis present

## 2018-08-13 DIAGNOSIS — Z6824 Body mass index (BMI) 24.0-24.9, adult: Secondary | ICD-10-CM

## 2018-08-13 DIAGNOSIS — J9621 Acute and chronic respiratory failure with hypoxia: Secondary | ICD-10-CM | POA: Diagnosis not present

## 2018-08-13 DIAGNOSIS — I70262 Atherosclerosis of native arteries of extremities with gangrene, left leg: Secondary | ICD-10-CM | POA: Diagnosis not present

## 2018-08-13 DIAGNOSIS — Z89611 Acquired absence of right leg above knee: Secondary | ICD-10-CM

## 2018-08-13 DIAGNOSIS — F14988 Cocaine use, unspecified with other cocaine-induced disorder: Secondary | ICD-10-CM | POA: Diagnosis not present

## 2018-08-13 DIAGNOSIS — D696 Thrombocytopenia, unspecified: Secondary | ICD-10-CM | POA: Diagnosis present

## 2018-08-13 DIAGNOSIS — R1011 Right upper quadrant pain: Secondary | ICD-10-CM | POA: Diagnosis present

## 2018-08-13 DIAGNOSIS — E43 Unspecified severe protein-calorie malnutrition: Secondary | ICD-10-CM | POA: Diagnosis present

## 2018-08-13 DIAGNOSIS — R7989 Other specified abnormal findings of blood chemistry: Secondary | ICD-10-CM

## 2018-08-13 DIAGNOSIS — N179 Acute kidney failure, unspecified: Secondary | ICD-10-CM | POA: Diagnosis present

## 2018-08-13 DIAGNOSIS — Z803 Family history of malignant neoplasm of breast: Secondary | ICD-10-CM

## 2018-08-13 DIAGNOSIS — L97909 Non-pressure chronic ulcer of unspecified part of unspecified lower leg with unspecified severity: Secondary | ICD-10-CM | POA: Diagnosis present

## 2018-08-13 DIAGNOSIS — R7401 Elevation of levels of liver transaminase levels: Secondary | ICD-10-CM

## 2018-08-13 DIAGNOSIS — D649 Anemia, unspecified: Secondary | ICD-10-CM | POA: Diagnosis present

## 2018-08-13 DIAGNOSIS — Z800 Family history of malignant neoplasm of digestive organs: Secondary | ICD-10-CM

## 2018-08-13 DIAGNOSIS — K7200 Acute and subacute hepatic failure without coma: Secondary | ICD-10-CM | POA: Diagnosis present

## 2018-08-13 DIAGNOSIS — G929 Unspecified toxic encephalopathy: Secondary | ICD-10-CM

## 2018-08-13 DIAGNOSIS — Z720 Tobacco use: Secondary | ICD-10-CM | POA: Diagnosis not present

## 2018-08-13 DIAGNOSIS — T8781 Dehiscence of amputation stump: Secondary | ICD-10-CM | POA: Diagnosis present

## 2018-08-13 DIAGNOSIS — R160 Hepatomegaly, not elsewhere classified: Secondary | ICD-10-CM | POA: Diagnosis present

## 2018-08-13 DIAGNOSIS — I70261 Atherosclerosis of native arteries of extremities with gangrene, right leg: Secondary | ICD-10-CM | POA: Diagnosis not present

## 2018-08-13 DIAGNOSIS — T8789 Other complications of amputation stump: Secondary | ICD-10-CM | POA: Diagnosis not present

## 2018-08-13 DIAGNOSIS — G92 Toxic encephalopathy: Secondary | ICD-10-CM | POA: Diagnosis present

## 2018-08-13 DIAGNOSIS — Z7189 Other specified counseling: Secondary | ICD-10-CM | POA: Diagnosis not present

## 2018-08-13 DIAGNOSIS — F1410 Cocaine abuse, uncomplicated: Secondary | ICD-10-CM | POA: Diagnosis present

## 2018-08-13 DIAGNOSIS — J189 Pneumonia, unspecified organism: Secondary | ICD-10-CM

## 2018-08-13 DIAGNOSIS — Z886 Allergy status to analgesic agent status: Secondary | ICD-10-CM

## 2018-08-13 DIAGNOSIS — I5033 Acute on chronic diastolic (congestive) heart failure: Secondary | ICD-10-CM | POA: Diagnosis present

## 2018-08-13 DIAGNOSIS — M79606 Pain in leg, unspecified: Secondary | ICD-10-CM | POA: Diagnosis not present

## 2018-08-13 DIAGNOSIS — R0902 Hypoxemia: Secondary | ICD-10-CM

## 2018-08-13 DIAGNOSIS — Z888 Allergy status to other drugs, medicaments and biological substances status: Secondary | ICD-10-CM

## 2018-08-13 LAB — I-STAT CG4 LACTIC ACID, ED
LACTIC ACID, VENOUS: 4.18 mmol/L — AB (ref 0.5–1.9)
Lactic Acid, Venous: 4.35 mmol/L (ref 0.5–1.9)

## 2018-08-13 LAB — INFLUENZA PANEL BY PCR (TYPE A & B)
Influenza A By PCR: NEGATIVE
Influenza B By PCR: NEGATIVE

## 2018-08-13 LAB — CBC WITH DIFFERENTIAL/PLATELET
Abs Immature Granulocytes: 0.21 10*3/uL — ABNORMAL HIGH (ref 0.00–0.07)
Basophils Absolute: 0 10*3/uL (ref 0.0–0.1)
Basophils Relative: 0 %
Eosinophils Absolute: 0 10*3/uL (ref 0.0–0.5)
Eosinophils Relative: 0 %
HCT: 33.2 % — ABNORMAL LOW (ref 36.0–46.0)
Hemoglobin: 9.8 g/dL — ABNORMAL LOW (ref 12.0–15.0)
Immature Granulocytes: 1 %
Lymphocytes Relative: 6 %
Lymphs Abs: 1.5 10*3/uL (ref 0.7–4.0)
MCH: 25.6 pg — ABNORMAL LOW (ref 26.0–34.0)
MCHC: 29.5 g/dL — ABNORMAL LOW (ref 30.0–36.0)
MCV: 86.7 fL (ref 80.0–100.0)
Monocytes Absolute: 1.5 10*3/uL — ABNORMAL HIGH (ref 0.1–1.0)
Monocytes Relative: 6 %
Neutro Abs: 23.7 10*3/uL — ABNORMAL HIGH (ref 1.7–7.7)
Neutrophils Relative %: 87 %
Platelets: 293 10*3/uL (ref 150–400)
RBC: 3.83 MIL/uL — ABNORMAL LOW (ref 3.87–5.11)
RDW: 19.9 % — ABNORMAL HIGH (ref 11.5–15.5)
WBC: 27 10*3/uL — ABNORMAL HIGH (ref 4.0–10.5)
nRBC: 0 % (ref 0.0–0.2)

## 2018-08-13 LAB — URINALYSIS, ROUTINE W REFLEX MICROSCOPIC
Bilirubin Urine: NEGATIVE
Glucose, UA: NEGATIVE mg/dL
Ketones, ur: 5 mg/dL — AB
Leukocytes, UA: NEGATIVE
NITRITE: NEGATIVE
PROTEIN: 100 mg/dL — AB
SPECIFIC GRAVITY, URINE: 1.017 (ref 1.005–1.030)
pH: 5 (ref 5.0–8.0)

## 2018-08-13 LAB — COMPREHENSIVE METABOLIC PANEL
ALT: 187 U/L — ABNORMAL HIGH (ref 0–44)
AST: 393 U/L — ABNORMAL HIGH (ref 15–41)
Albumin: 3.1 g/dL — ABNORMAL LOW (ref 3.5–5.0)
Alkaline Phosphatase: 101 U/L (ref 38–126)
Anion gap: 16 — ABNORMAL HIGH (ref 5–15)
BUN: 39 mg/dL — ABNORMAL HIGH (ref 6–20)
CO2: 14 mmol/L — ABNORMAL LOW (ref 22–32)
Calcium: 8.8 mg/dL — ABNORMAL LOW (ref 8.9–10.3)
Chloride: 103 mmol/L (ref 98–111)
Creatinine, Ser: 1.18 mg/dL — ABNORMAL HIGH (ref 0.44–1.00)
GFR calc Af Amer: 59 mL/min — ABNORMAL LOW (ref >60)
GFR calc non Af Amer: 51 mL/min — ABNORMAL LOW (ref >60)
Glucose, Bld: 58 mg/dL — ABNORMAL LOW (ref 70–99)
Potassium: 5.2 mmol/L — ABNORMAL HIGH (ref 3.5–5.1)
Sodium: 133 mmol/L — ABNORMAL LOW (ref 135–145)
Total Bilirubin: 5.2 mg/dL — ABNORMAL HIGH (ref 0.3–1.2)
Total Protein: 6.5 g/dL (ref 6.5–8.1)

## 2018-08-13 LAB — LACTIC ACID, PLASMA
LACTIC ACID, VENOUS: 4.3 mmol/L — AB (ref 0.5–1.9)
Lactic Acid, Venous: 2.7 mmol/L (ref 0.5–1.9)

## 2018-08-13 LAB — CBG MONITORING, ED: Glucose-Capillary: 80 mg/dL (ref 70–99)

## 2018-08-13 MED ORDER — METRONIDAZOLE IN NACL 5-0.79 MG/ML-% IV SOLN
500.0000 mg | Freq: Three times a day (TID) | INTRAVENOUS | Status: DC
  Administered 2018-08-13: 500 mg via INTRAVENOUS
  Filled 2018-08-13: qty 100

## 2018-08-13 MED ORDER — SODIUM CHLORIDE 0.9 % IV SOLN
2.0000 g | Freq: Once | INTRAVENOUS | Status: AC
  Administered 2018-08-13: 2 g via INTRAVENOUS
  Filled 2018-08-13: qty 2

## 2018-08-13 MED ORDER — OXYCODONE HCL 5 MG PO TABS
5.0000 mg | ORAL_TABLET | ORAL | Status: DC | PRN
  Administered 2018-08-13 – 2018-08-14 (×2): 5 mg via ORAL
  Filled 2018-08-13 (×2): qty 1

## 2018-08-13 MED ORDER — SODIUM CHLORIDE 0.9 % IV SOLN
INTRAVENOUS | Status: AC
  Administered 2018-08-13: 09:00:00 via INTRAVENOUS

## 2018-08-13 MED ORDER — VANCOMYCIN HCL IN DEXTROSE 1-5 GM/200ML-% IV SOLN
1000.0000 mg | INTRAVENOUS | Status: DC
  Administered 2018-08-15 – 2018-08-20 (×6): 1000 mg via INTRAVENOUS
  Filled 2018-08-13 (×7): qty 200

## 2018-08-13 MED ORDER — ONDANSETRON HCL 4 MG/2ML IJ SOLN: 4.0000 mg | Freq: Four times a day (QID) | INTRAMUSCULAR | Status: DC | PRN

## 2018-08-13 MED ORDER — HYDROMORPHONE HCL 1 MG/ML IJ SOLN
0.5000 mg | INTRAMUSCULAR | Status: DC | PRN
  Administered 2018-08-13 – 2018-08-14 (×2): 0.5 mg via INTRAVENOUS
  Filled 2018-08-13 (×2): qty 1

## 2018-08-13 MED ORDER — SODIUM CHLORIDE 0.9 % IV SOLN
1.0000 g | Freq: Two times a day (BID) | INTRAVENOUS | Status: DC
  Administered 2018-08-13 – 2018-08-14 (×2): 1 g via INTRAVENOUS
  Filled 2018-08-13 (×2): qty 1

## 2018-08-13 MED ORDER — SODIUM CHLORIDE 0.9 % IV BOLUS (SEPSIS): 1000.0000 mL | Freq: Once | INTRAVENOUS | Status: DC

## 2018-08-13 MED ORDER — HYDROMORPHONE HCL 1 MG/ML IJ SOLN
1.0000 mg | Freq: Once | INTRAMUSCULAR | Status: AC
  Administered 2018-08-13: 1 mg via INTRAMUSCULAR
  Filled 2018-08-13: qty 1

## 2018-08-13 MED ORDER — SODIUM CHLORIDE 0.9 % IV BOLUS (SEPSIS)
500.0000 mL | Freq: Once | INTRAVENOUS | Status: AC
  Administered 2018-08-13: 500 mL via INTRAVENOUS

## 2018-08-13 MED ORDER — SODIUM CHLORIDE 0.9 % IV BOLUS
1000.0000 mL | Freq: Once | INTRAVENOUS | Status: AC
  Administered 2018-08-13: 1000 mL via INTRAVENOUS

## 2018-08-13 MED ORDER — ENOXAPARIN SODIUM 40 MG/0.4ML ~~LOC~~ SOLN
40.0000 mg | Freq: Every day | SUBCUTANEOUS | Status: DC
  Administered 2018-08-13 – 2018-08-17 (×5): 40 mg via SUBCUTANEOUS
  Filled 2018-08-13 (×5): qty 0.4

## 2018-08-13 MED ORDER — ONDANSETRON HCL 4 MG PO TABS: 4.0000 mg | ORAL_TABLET | Freq: Four times a day (QID) | ORAL | Status: DC | PRN

## 2018-08-13 MED ORDER — VANCOMYCIN HCL IN DEXTROSE 1-5 GM/200ML-% IV SOLN
1000.0000 mg | Freq: Once | INTRAVENOUS | Status: AC
  Administered 2018-08-13: 1000 mg via INTRAVENOUS
  Filled 2018-08-13: qty 200

## 2018-08-13 NOTE — Progress Notes (Signed)
CRITICAL VALUE ALERT  Critical Value:  Lactic Acid 4.3  Date & Time Notied: 08/13/18  Provider Notified: Called Dr. Daryll Drown  Orders Received/Actions taken: Notified Dr. Daryll Drown of lactic acid critical value. Will await orders for action to be taken

## 2018-08-13 NOTE — ED Triage Notes (Signed)
Pt presents from home with [facility] for c/o bilat BKAs with foul odor and drainage; pt with multiple "alevyn" dressings to stumps and hips and sacrum; pt reports she continues to use cocaine

## 2018-08-13 NOTE — Progress Notes (Signed)
CRITICAL VALUE ALERT  Critical Value:  Lactic Acid 2.7   Date & Time Notied:  08/13/18 1054  Provider Notified: Notified Dr. Daryll Drown   Orders Received/Actions taken: Notified Dr. Daryll Drown of critical lab value. Will await orders

## 2018-08-13 NOTE — Progress Notes (Signed)
Pharmacy Antibiotic Note  [name] is a 57 y.o. female admitted on 08/13/2018 with sepsis.  Pharmacy has been consulted for Vancomycin/Cefepime dosing. Possible wound infection as source. WBC elevated. Mild bump in Scr. Lactic acid elevated.   Plan: Vancomycin 1000 mg IV q24h Cefepime 1g IV q12h Trend WBC, temp, renal function  F/U infectious work-up Drug levels as indicated  Temp (24hrs), Avg:99 F (37.2 C), Min:97.7 F (36.5 C), Max:100.3 F (37.9 C)  Recent Labs  Lab 08/11/18 0101 08/13/18 0245 08/13/18 0255 08/13/18 0357 08/13/18 0546  WBC 10.7* 27.0*  --   --   --   CREATININE 0.97  --  1.18*  --   --   LATICACIDVEN  --   --   --  4.35* 4.18*    Estimated Creatinine Clearance: 40 mL/min (A) (by C-G formula based on SCr of 1.18 mg/dL (H)).    Allergies  Allergen Reactions  . Acetaminophen Swelling and Other (See Comments)    Reaction:  Eyelid swelling  . Haldol [Haloperidol Lactate]     Possible eye swelling was given together with Toradol unsure if caused the reaction  . Lisinopril     UNSPECIFIED REACTION   . Toradol [Ketorolac Tromethamine]     Eye swelling    Narda Bonds 08/13/2018 6:36 AM

## 2018-08-13 NOTE — H&P (Signed)
History and Physical    [name] PJA:[id number] DOB: Oct 07, 1960 DOA: 08/13/2018  PCP: Azzie Glatter, FNP  Patient coming from: Home   Chief Complaint: "they told me I was sick"  HPI: [name] is a 57 y.o. female with medical history significant of Levamisole vasculitis, RA, anemia, migraine, HTN, depression, cocaine use who presents for bilateral LE pain.  Patient was somnolent when I saw her, awake to voice but quickly falling back to sleep.  I obtained history partially from chart review and from patient.  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  This is apparently why she came in initially.  On evaluation, she was found to have an elevated WBC, lactic acidosis, temperature of 100.3 and there was concern for sepsis.  She did complain of a cold initially too.  On my interview with her, she noted chronic leg pain as the reason for coming in.  She denied cough, sick contacts, chest pain, fever, headache, neck pain.  She did have some RUQ abdominal pain on exam.  She is very thin and noted to be homeless in the EDP note.  She reports taking some prescription medications to me, but reported to the pharmacy that she was not taking any of her medications currently.   She notes that she last smoked crack cocaine 1 day ago and she is a daily tobacco smoker.    ED Course: In the ED, she was found to have a temperature of 100.3.  Na was 133, K 5.2, BUN/Cr was 33, AST was 393, ALT 187 (2:1 ratio).  WBC was 27, Lactate initially 4.35 and down to 4.18 with bolus of 1061m.  CXR showed a small infiltrate in LLL which radiology interpreted as a nipple shadow, EDP was concerned for a pneumonia and called for admission for sepsis.    Review of Systems: As per HPI.   Past Medical History:  Diagnosis Date  . CAP (community acquired pneumonia) 03/2014 X 2    . [place]    ongoing with resultant vaculitis.  . Depression   . Headache    "weekly" (07/29/2016)  . Hypertension   . Inflammatory arthritis   . Migraines    "probably 5-6/yr" (07/29/2016)  . Normocytic anemia    BL Hgb 9.8-12. Last anemia panel 04/2010 - showing Fe 19, ferritin 101.  Pt on monthly B12 injections  . Rheumatoid arthritis(714.0)    patient reported  . 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 Followed by Dr. NLouanne Skye   Past Surgical History:  Procedure Laterality Date  . AMPUTATION Left 05/22/2016   Procedure: AMPUTATION LEFT LONG FINGER;  Surgeon: MMarybelle Killings MD;  [place] [facility]  Service: [place]opedics;  Laterality: Left;  . AMPUTATION Bilateral 04/10/2017   Procedure: AMPUTATION BELOW KNEE;  Surgeon: DNewt Minion MD;  [place] [facility]  Service: Orthopedics;  Laterality: Bilateral;  . AMPUTATION Bilateral 02/06/2018   Procedure: AMPUTATION ABOVE KNEE;  Surgeon: DNewt Minion MD;  [place] [facility]  Service: Orthopedics;  Laterality: Bilateral;  . HERNIA REPAIR     "stomach"  . I&D EXTREMITY Right 09/26/2015   Procedure: IRRIGATION AND DEBRIDEMENT LEG WOUND  VAC PLACEMENT.;  Surgeon: CLoel LoftyDillingham, DO;  [place] [facility]  Service: Plastics;  Laterality:  Right;  Marland Kitchen INCISION AND DRAINAGE OF WOUND Bilateral 10/20/2016   Procedure: IRRIGATION AND DEBRIDEMENT WOUND BILATERAL;  Surgeon: Edrick Kins, DPM;  Location: [place];  Service: Podiatry;  Laterality: Bilateral;  . IRRIGATION AND DEBRIDEMENT ABSCESS Bilateral 09/26/2013   Procedure: DEBRIDEMENT ULCERS BILATERAL THIGHS;  Surgeon: Gwenyth Ober, MD;  Location: [place];  Service: General;  Laterality: Bilateral;  . SKIN BIOPSY Bilateral    shin nodules   Reviewed with patient.    reports that she has been smoking cigarettes. She has a 4.56 pack-year smoking history. She has never used smokeless tobacco. She reports that she has current or past drug  history. Drugs: "Crack" cocaine and Cocaine. She reports that she does not drink alcohol.  Allergies  Allergen Reactions  . Acetaminophen Swelling and Other (See Comments)    Reaction:  Eyelid swelling  . Haldol [Haloperidol Lactate]     Possible eye swelling was given together with Toradol unsure if caused the reaction  . Lisinopril     UNSPECIFIED REACTION   . Toradol [Ketorolac Tromethamine]     Eye swelling    Family History  Problem Relation Age of Onset  . Breast cancer Mother        Breast cancer  . Alcohol abuse Mother   . Colon cancer Maternal Aunt 30  . Alcohol abuse Father    Medications None prior to admission   Physical Exam:  Constitutional: Very thin, ill appearing, lying in bed asleep, awakens to voice Vitals:   08/13/18 0615 08/13/18 0630 08/13/18 0645 08/13/18 0700  BP: (!) 148/93 (!) 149/96 (!) 133/96 (!) 151/91  Pulse: 96 92 95 95  Resp: 20 (!) 21 20 (!) 21  Temp:      TempSrc:      SpO2: 95% 92% 93% 94%   Eyes: She has chronic changes to eyes, with heaped up sclerae laterally, appears to be pingueculae ENMT: MM are somewhat dry, poor dentition Neck: normal, supple Respiratory: CTAB, no wheezing noted Cardiovascular: RR, NR, + murmur heard best at LUSB Abdomen: Very thing, scaphoid, + TTP over LUQ and suprapubic area, very little subQ fat.  Musculoskeletal: She is s/p amputation of both LE above the knee, new dressings applied to stumps.  She is s/p amputation of left middle finger.  She has no cyanosis.  Skin: She has new bandages over bilateral stumps.  I reviewed pictures of open wounds on stumps in imaging found in EDP note.  She has no erythema on legs, but they are tender to palpation.  No warmth noted.  She has bandages applied over both ischial crests.  I do not see any other back breakdown.  She had very little Subq fat.   Neurologic: CN 2-12 grossly intact. She is moving all extremities.  Strength is 4+ out of 5 in the upper extremities.   She has garbled speech, but I am not sure if that is new.     Labs on Admission: I have personally reviewed following labs and imaging studies  CBC: Recent Labs  Lab 08/11/18 0101 08/13/18 0245  WBC 10.7* 27.0*  NEUTROABS 7.6 23.7*  HGB 10.2* 9.8*  HCT 32.3* 33.2*  MCV 81.8 86.7  PLT 335 453   Basic Metabolic Panel: Recent Labs  Lab 08/11/18 0101 08/13/18 0255  NA 130* 133*  K 3.7 5.2*  CL 101 103  CO2 16* 14*  GLUCOSE 100* 58*  BUN 21* 39*  CREATININE 0.97 1.18*  CALCIUM 8.7* 8.8*  GFR: Estimated Creatinine Clearance: 40 mL/min (A) (by C-G formula based on SCr of 1.18 mg/dL (H)). Liver Function Tests: Recent Labs  Lab 08/11/18 0101 08/13/18 0255  AST 32 393*  ALT 39 187*  ALKPHOS 90 101  BILITOT 2.9* 5.2*  PROT 6.1* 6.5  ALBUMIN 2.9* 3.1*   Recent Labs  Lab 08/11/18 0101  LIPASE 25   No results for input(s): AMMONIA in the last 168 hours. Coagulation Profile: No results for input(s): INR, PROTIME in the last 168 hours. Cardiac Enzymes: No results for input(s): CKTOTAL, CKMB, CKMBINDEX, TROPONINI in the last 168 hours. BNP (last 3 results) No results for input(s): PROBNP in the last 8760 hours. HbA1C: No results for input(s): HGBA1C in the last 72 hours. CBG: No results for input(s): GLUCAP in the last 168 hours. Lipid Profile: No results for input(s): CHOL, HDL, LDLCALC, TRIG, CHOLHDL, LDLDIRECT in the last 72 hours. Thyroid Function Tests: No results for input(s): TSH, T4TOTAL, FREET4, T3FREE, THYROIDAB in the last 72 hours. Anemia Panel: No results for input(s): VITAMINB12, FOLATE, FERRITIN, TIBC, IRON, RETICCTPCT in the last 72 hours. Urine analysis:    Component Value Date/Time   COLORURINE AMBER (A) 08/13/2018 0552   APPEARANCEUR CLEAR 08/13/2018 0552   LABSPEC 1.017 08/13/2018 0552   PHURINE 5.0 08/13/2018 0552   GLUCOSEU NEGATIVE 08/13/2018 0552   HGBUR MODERATE (A) 08/13/2018 0552   BILIRUBINUR NEGATIVE 08/13/2018 0552    BILIRUBINUR small 04/29/2015 1533   KETONESUR 5 (A) 08/13/2018 0552   PROTEINUR 100 (A) 08/13/2018 0552   UROBILINOGEN 1.0 07/26/2017 1550   NITRITE NEGATIVE 08/13/2018 0552   LEUKOCYTESUR NEGATIVE 08/13/2018 0552    Radiological Exams on Admission: Dg Chest Port 1 View  Result Date: 08/13/2018 CLINICAL DATA:  Initial evaluation for acute fever. EXAM: PORTABLE CHEST 1 VIEW COMPARISON:  Prior radiograph from 07/30/2018. FINDINGS: Cardiomegaly, unchanged. Mediastinal silhouette within normal limits. Aortic atherosclerosis. Lungs mildly hypoinflated. Mild diffuse pulmonary interstitial edema. No appreciable pleural effusion. 19 mm nodular density overlying the mid-lower left lung most likely reflects a nipple shadow. Similar shadow seen overlying the lateral margin of the right hemithorax. No other focal infiltrates. No pneumothorax. No acute osseous abnormality. IMPRESSION: 1. Cardiomegaly with mild diffuse pulmonary interstitial congestion/edema. 2. No other active cardiopulmonary disease.  No focal infiltrates. 3. Nodular densities overlying the bilateral lung bases, most consistent with nipple shadows. 4. Aortic atherosclerosis. Electronically Signed   By: Jeannine Boga M.D.   On: 08/13/2018 03:20   Dg Femur Port 1v Left  Result Date: 08/13/2018 CLINICAL DATA:  Initial evaluation for acute fever. EXAM: LEFT FEMUR PORTABLE 1 VIEW COMPARISON:  Recent radiograph from 07/30/2018. FINDINGS: Patient status post AKA. Subtle periosteal reaction along the amputated margin of the left femur, similar to previous exam. Additionally, there is subtle osseous erosion at the amputated margin, not definitely seen on previous, suspicious for possible acute osteomyelitis. Overlying soft tissue irregularity without soft tissue emphysema. No acute fracture or dislocation. Osteoarthritic changes noted about the left hip. IMPRESSION: Subtle periosteal reaction with osseous erosion at the amputated margin of the left  femoral AKA, suspicious for possible acute osteomyelitis. Further evaluation and confirmation could be performed with follow-up MRI as clinically warranted. Electronically Signed   By: Jeannine Boga M.D.   On: 08/13/2018 04:46   Dg Femur Port, 1v Right  Result Date: 08/13/2018 CLINICAL DATA:  Initial evaluation for acute fever, evaluate for infection. EXAM: RIGHT FEMUR PORTABLE 1 VIEW COMPARISON:  Prior radiograph from 08/09/2018. FINDINGS: Patient is  status post AKA. Subtle periosteal reaction along the medial margin of the amputated femur again seen, stable from previous. No interval osseous erosion. Mild overlying soft tissue irregularity. No radiopaque foreign body or soft tissue emphysema. No acute fracture or dislocation. Sclerotic lesion within the right femoral neck noted, unchanged. IMPRESSION: Similar appearance of right AKA with subtle periosteal reaction at the distal right femur, stable relative to recent radiograph from 08/09/2018. No interval osseous erosion or definite evidence for acute osteomyelitis. Again, further evaluation with dedicated MRI could be performed if there is clinical concern for ongoing osteomyelitis. Electronically Signed   By: Jeannine Boga M.D.   On: 08/13/2018 04:43    EKG: Independently reviewed. Abnormal, PVC noted, TWI in anterior leads, QT prolongation.  No obvious changes to suggest ischemia.    Assessment/Plan Sepsis, unknown source - Source could be pulmonary vs chronic wounds on legs.  UA not consistent with infection.  CXR showed a small nodular density which could be a nipple shadow.  She has chronic skin breakdown on her stumps bilaterally which would be a source for infection - Consider MRI of bilateral stumps when renal function improves - reviewed XR of both stumps and she does have changes suggestive of OM, but these changes are chronic and MRI is suggested - Continue broad spectrum Abx with cefepime and vancomycin - Trend lactic  acid - IVF with NS at 150cc/hr for 10 hours, monitor for respiratory issues given history of diastolic CHF - SDU - Telemetry - Repeat CXR with a lateral if possible to better characterize the nodular density - Oxygen to keep saturation > 92% - Wound care for LE wounds - She is a chronic cocaine user, she has not been abstinent and reports continued use.  This is going to make wound healing very difficult for her.  - BC X 2 pending  Acute kidney injury - she has received 1500cc of fluid in the ED  - Trend Cr, recheck BMET in the AM  Elevated LFTs - Work up during last hospitalization at the end of October revealed negative hepatitis labs, CT scan of the abdomen with hepatic congestion.  She has a classic 2:1 ratio of her AST/ALT making ETOH a possibility.  She has denied ETOH use.  AST is 9X ULN and ALT Is 4X ULN.  Likely DDx included ETOH induced hepatitis vs hepatic congestion from diastolic CHF - Trend and monitor.  - Consider testing for autoimmune hepatitis in future if not improving.   Chronic diastolic CHF - Currently not in acute exacerbation, but she will require fluids for acute issues as noted above - Oxygen - Monitor for respiratory issues, increase Oxygen as needed and consider lasix if becomes volume overloaded    Essential hypertension - Currently not on any medications at home, BP is elevated - Hold starting BP medication until clinical status improves    Cocaine-induced vascular disorder - I think her wounds are likely related to this issue, however, she does have a source of bacterial infection given the open nature of the wounds and limited healthcare access - Wound care, counseling on cessation.     Protein-calorie malnutrition, severe  - NPO for now, will do regular diet with protein shakes when patient able to tolerate PO intake    Tobacco abuse - counseling should be provided for cessation prior to discharge.     S/P bilateral below knee amputation with  Ulcer of amputation stump of lower extremity  - See above, wound care, treating  for possible sepsis.        DVT prophylaxis: Lovenox Code Status: Full Disposition Plan: Admit for further work up, discharge in 4-5 days Consults called: None Admission status: SDU, inpatient  Gilles Chiquito MD [facility] Pager [phone]  If 7PM-7AM, please contact night-coverage [email or web address] Password [facility]  08/13/2018, 7:35 AM

## 2018-08-13 NOTE — Progress Notes (Signed)
Patient arrived to the unit from ED. She is drowsy but she responds to voice and answers questions appropriately. Patient is bilateral AKA. She has gauze on both stumps. She has abrasions on her sacral area, bilateral buttocks, and bilateral thigh area. Placed new foams on sacral area, buttocks and thighs. She has been placed on the telemetry monitor, CCMD notified with 2nd Verification. Patient VS are stable. CHG bath has been done. She has been oriented to room, call bell within reach. Patient is resting comfortably in bed. I will continue to monitor patient.

## 2018-08-13 NOTE — ED Provider Notes (Addendum)
[facility] EMERGENCY DEPARTMENT Provider Note   CSN: [id number] Arrival date & time: 08/13/18  0059     History   Chief Complaint Chief Complaint  Patient presents with  . Wound Check    HPI [name] is a 57 y.o. female who presents with bilateral stump pain. PMH significant for hx of cocaine abuse, grade 2 diastolic congestive heart failure with EF of 60 to 65%, hypertension, peripheral vascular disease status post bilateral AKA, homelessness. She states that her brother called EMS tonight because she was in so much pain from her stumps. The pain is constant. Nothing makes it better. She reports subjective fevers at home and she's had "a cold". She denies any other complaints. She has not followed up with orthopedics because no one ever told her who the orthopedic doctor was.   HPI  Past Medical History:  Diagnosis Date  . CAP (community acquired pneumonia) 03/2014 X 2  . Cocaine abuse ([place])    ongoing with resultant vaculitis.  . Depression   . Headache    "weekly" (07/29/2016)  . Hypertension   . Inflammatory arthritis   . Migraines    "probably 5-6/yr" (07/29/2016)  . Normocytic anemia    BL Hgb 9.8-12. Last anemia panel 04/2010 - showing Fe 19, ferritin 101.  Pt on monthly B12 injections  . Rheumatoid arthritis(714.0)    patient reported  . 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 Followed by Dr. Louanne Skye    Patient Active Problem List   Diagnosis Date Noted  . Ulcer of right thigh, limited to breakdown of skin ([place])   . S/P bilateral above knee amputation ([place])   . Homelessness 07/30/2018  . Elevated LFTs 07/30/2018  . Osteomyelitis ([place]) 07/30/2018  . Acute encephalopathy 07/30/2018  . Eye swelling, bilateral 07/30/2018  . Skin rash 07/11/2018  . Acute on chronic diastolic CHF (congestive heart failure) ([place]) 06/21/2018  . Malnutrition of moderate degree 05/20/2018  . Acute congestive  heart failure ([place])   . Suicidal ideation 02/02/2018  . HCAP (healthcare-associated pneumonia) 02/02/2018  . Acute on chronic respiratory failure with hypoxia ([place]) 02/02/2018  . Sepsis ([place]) 02/02/2018  . Ulcer of amputation stump of lower extremity ([place]) 10/23/2017  . Cocaine abuse with cocaine-induced mood disorder ([place]) 08/10/2017  . Hypertensive crisis   . Phantom limb pain ([place])   . S/P bilateral below knee amputation ([place]) 04/13/2017  . Tobacco abuse   . Post-operative pain   . Acute blood loss anemia   . Atherosclerosis of native arteries of extremities with gangrene, bilateral legs ([place])   . Wound infection 03/30/2017  . AKI (acute kidney injury) ([place]) 02/07/2017  . Acute kidney injury ([place]) 02/06/2017  . Wound healing, delayed   . Chest pain 04/07/2015  . Protein-calorie malnutrition, severe ([place]) 01/15/2015  . MDD (major depressive disorder), recurrent episode, severe ([place]) 10/16/2014  . Cocaine use disorder, severe, dependence ([place]) 10/10/2014  . 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 Essential hypertension 02/26/2010    Past Surgical History:  Procedure Laterality Date  . AMPUTATION Left 05/22/2016   Procedure: AMPUTATION LEFT LONG FINGER;  Surgeon: Marybelle Killings, MD;  Location: [facility];  Service: Orthopedics;  Laterality: Left;  . AMPUTATION Bilateral 04/10/2017   Procedure: AMPUTATION BELOW KNEE;  Surgeon: Newt Minion, MD;  Location: [facility];  Service: Orthopedics;  Laterality: Bilateral;  . AMPUTATION Bilateral 02/06/2018   Procedure: AMPUTATION ABOVE  KNEE;  Surgeon: Newt Minion, MD;  Location: [facility];  Service: Orthopedics;  Laterality: Bilateral;  . HERNIA REPAIR     "stomach"  . I&D EXTREMITY Right 09/26/2015   Procedure: IRRIGATION AND DEBRIDEMENT LEG WOUND  VAC PLACEMENT.;  Surgeon: Loel Lofty Dillingham, DO;  Location: [facility];  Service: Plastics;  Laterality: Right;  . INCISION AND DRAINAGE OF WOUND Bilateral 10/20/2016   Procedure: IRRIGATION AND  DEBRIDEMENT WOUND BILATERAL;  Surgeon: Edrick Kins, DPM;  Location: [facility];  Service: Podiatry;  Laterality: Bilateral;  . IRRIGATION AND DEBRIDEMENT ABSCESS Bilateral 09/26/2013   Procedure: DEBRIDEMENT ULCERS BILATERAL THIGHS;  Surgeon: Gwenyth Ober, MD;  Location: [facility];  Service: General;  Laterality: Bilateral;  . SKIN BIOPSY Bilateral    shin nodules     OB History   None      Home Medications    Prior to Admission medications   Medication Sig Start Date End Date Taking? Authorizing Provider  amLODipine (NORVASC) 10 MG tablet Take 1 tablet (10 mg total) by mouth daily. Patient not taking: Reported on 08/11/2018 06/23/18   Patrecia Pour, Christean Grief, MD  furosemide (LASIX) 40 MG tablet Take 1 tablet (40 mg total) by mouth daily. Patient not taking: Reported on 08/11/2018 06/23/18   Patrecia Pour, Christean Grief, MD  gabapentin (NEURONTIN) 100 MG capsule Take 1 capsule (100 mg total) by mouth 3 (three) times daily. Patient not taking: Reported on 08/11/2018 07/24/18   Quintella Reichert, MD  losartan (COZAAR) 25 MG tablet Take 1 tablet (25 mg total) by mouth daily. Patient not taking: Reported on 08/11/2018 06/23/18   Patrecia Pour, Christean Grief, MD  ondansetron (ZOFRAN ODT) 4 MG disintegrating tablet Take 1 tablet (4 mg total) by mouth every 8 (eight) hours as needed for nausea or vomiting. Patient not taking: Reported on 08/11/2018 07/29/18   Montine Circle, PA-C  ondansetron (ZOFRAN) 4 MG tablet Take 1 tablet (4 mg total) by mouth every 6 (six) hours as needed for nausea or vomiting. 71/8/55   Delora Fuel, MD  pantoprazole (PROTONIX) 40 MG tablet Take 1 tablet (40 mg total) by mouth daily. 10/09/84   Delora Fuel, MD    Family History Family History  Problem Relation Age of Onset  . Breast cancer Mother        Breast cancer  . Alcohol abuse Mother   . Colon cancer Maternal Aunt 32  . Alcohol abuse Father     Social History Social History   Tobacco Use  . Smoking status: Current Every Day Smoker      Packs/day: 0.12    Years: 38.00    Pack years: 4.56    Types: Cigarettes  . Smokeless tobacco: Never Used  . Tobacco comment: 2 A DAY  Substance Use Topics  . Alcohol use: No    Alcohol/week: 0.0 standard drinks  . Drug use: Yes    Types: "Crack" cocaine, Cocaine    Comment: Smoked crack 06/02/2017     Allergies   Acetaminophen; Haldol [haloperidol lactate]; Lisinopril; and Toradol [ketorolac tromethamine]   Review of Systems Review of Systems  Constitutional: Positive for fever.  Respiratory: Positive for cough.   Musculoskeletal: Positive for arthralgias.  Skin: Positive for wound.  All other systems reviewed and are negative.    Physical Exam Updated Vital Signs BP (!) 188/105   Pulse 100   Temp 100.3 F (37.9 C) (Axillary)   Resp (!) 22   SpO2 100%   Physical Exam  Constitutional: She is  oriented to person, place, and time. She appears well-developed and well-nourished. No distress.  Chronically ill appearing, crying  HENT:  Head: Normocephalic and atraumatic.  Eyes: Pupils are equal, round, and reactive to light. Conjunctivae are normal. Right eye exhibits no discharge. Left eye exhibits no discharge. Scleral icterus is present.  Neck: Normal range of motion.  Cardiovascular: Tachycardia present.  Pulmonary/Chest: Effort normal and breath sounds normal. No respiratory distress.  Abdominal: Soft. Bowel sounds are normal. She exhibits no distension. There is no tenderness.  Musculoskeletal:  Right stump: mild erythema with healing wounds. See pic for detail  Left stump:   Neurological: She is alert and oriented to person, place, and time.  Skin: Skin is warm and dry.  Psychiatric: She has a normal mood and affect. Her behavior is normal.  Nursing note and vitals reviewed.         ED Treatments / Results  Labs (all labs ordered are listed, but only abnormal results are displayed) Labs Reviewed  CBC WITH DIFFERENTIAL/PLATELET - Abnormal;  Notable for the following components:      Result Value   WBC 27.0 (*)    RBC 3.83 (*)    Hemoglobin 9.8 (*)    HCT 33.2 (*)    MCH 25.6 (*)    MCHC 29.5 (*)    RDW 19.9 (*)    Neutro Abs 23.7 (*)    Monocytes Absolute 1.5 (*)    Abs Immature Granulocytes 0.21 (*)    All other components within normal limits  COMPREHENSIVE METABOLIC PANEL - Abnormal; Notable for the following components:   Sodium 133 (*)    Potassium 5.2 (*)    CO2 14 (*)    Glucose, Bld 58 (*)    BUN 39 (*)    Creatinine, Ser 1.18 (*)    Calcium 8.8 (*)    Albumin 3.1 (*)    AST 393 (*)    ALT 187 (*)    Total Bilirubin 5.2 (*)    GFR calc non Af Amer 51 (*)    GFR calc Af Amer 59 (*)    Anion gap 16 (*)    All other components within normal limits  I-STAT CG4 LACTIC ACID, ED - Abnormal; Notable for the following components:   Lactic Acid, Venous 4.35 (*)    All other components within normal limits  I-STAT CG4 LACTIC ACID, ED - Abnormal; Notable for the following components:   Lactic Acid, Venous 4.18 (*)    All other components within normal limits  CULTURE, BLOOD (ROUTINE X 2)  CULTURE, BLOOD (ROUTINE X 2)  URINALYSIS, ROUTINE W REFLEX MICROSCOPIC  INFLUENZA PANEL BY PCR (TYPE A & B)    EKG None  Radiology Dg Chest Port 1 View  Result Date: 08/13/2018 CLINICAL DATA:  Initial evaluation for acute fever. EXAM: PORTABLE CHEST 1 VIEW COMPARISON:  Prior radiograph from 07/30/2018. FINDINGS: Cardiomegaly, unchanged. Mediastinal silhouette within normal limits. Aortic atherosclerosis. Lungs mildly hypoinflated. Mild diffuse pulmonary interstitial edema. No appreciable pleural effusion. 19 mm nodular density overlying the mid-lower left lung most likely reflects a nipple shadow. Similar shadow seen overlying the lateral margin of the right hemithorax. No other focal infiltrates. No pneumothorax. No acute osseous abnormality. IMPRESSION: 1. Cardiomegaly with mild diffuse pulmonary interstitial  congestion/edema. 2. No other active cardiopulmonary disease.  No focal infiltrates. 3. Nodular densities overlying the bilateral lung bases, most consistent with nipple shadows. 4. Aortic atherosclerosis. Electronically Signed   By: Pincus Badder.D.  On: 08/13/2018 03:20   Dg Femur Port 1v Left  Result Date: 08/13/2018 CLINICAL DATA:  Initial evaluation for acute fever. EXAM: LEFT FEMUR PORTABLE 1 VIEW COMPARISON:  Recent radiograph from 07/30/2018. FINDINGS: Patient status post AKA. Subtle periosteal reaction along the amputated margin of the left femur, similar to previous exam. Additionally, there is subtle osseous erosion at the amputated margin, not definitely seen on previous, suspicious for possible acute osteomyelitis. Overlying soft tissue irregularity without soft tissue emphysema. No acute fracture or dislocation. Osteoarthritic changes noted about the left hip. IMPRESSION: Subtle periosteal reaction with osseous erosion at the amputated margin of the left femoral AKA, suspicious for possible acute osteomyelitis. Further evaluation and confirmation could be performed with follow-up MRI as clinically warranted. Electronically Signed   By: Jeannine Boga M.D.   On: 08/13/2018 04:46   Dg Femur Port, 1v Right  Result Date: 08/13/2018 CLINICAL DATA:  Initial evaluation for acute fever, evaluate for infection. EXAM: RIGHT FEMUR PORTABLE 1 VIEW COMPARISON:  Prior radiograph from 08/09/2018. FINDINGS: Patient is status post AKA. Subtle periosteal reaction along the medial margin of the amputated femur again seen, stable from previous. No interval osseous erosion. Mild overlying soft tissue irregularity. No radiopaque foreign body or soft tissue emphysema. No acute fracture or dislocation. Sclerotic lesion within the right femoral neck noted, unchanged. IMPRESSION: Similar appearance of right AKA with subtle periosteal reaction at the distal right femur, stable relative to recent  radiograph from 08/09/2018. No interval osseous erosion or definite evidence for acute osteomyelitis. Again, further evaluation with dedicated MRI could be performed if there is clinical concern for ongoing osteomyelitis. Electronically Signed   By: Jeannine Boga M.D.   On: 08/13/2018 04:43    Procedures Procedures (including critical care time)  CRITICAL CARE Performed by: Recardo Evangelist   Total critical care time: 35 minutes  Critical care time was exclusive of separately billable procedures and treating other patients.  Critical care was necessary to treat or prevent imminent or life-threatening deterioration.  Critical care was time spent personally by me on the following activities: development of treatment plan with patient and/or surrogate as well as nursing, discussions with consultants, evaluation of patient's response to treatment, examination of patient, obtaining history from patient or surrogate, ordering and performing treatments and interventions, ordering and review of laboratory studies, ordering and review of radiographic studies, pulse oximetry and re-evaluation of patient's condition.   Medications Ordered in ED Medications  metroNIDAZOLE (FLAGYL) IVPB 500 mg (0 mg Intravenous Stopped 08/13/18 0545)  vancomycin (VANCOCIN) IVPB 1000 mg/200 mL premix (1,000 mg Intravenous New Bag/Given 08/13/18 0544)  HYDROmorphone (DILAUDID) injection 1 mg (1 mg Intramuscular Given 08/13/18 0301)  sodium chloride 0.9 % bolus 1,000 mL (0 mLs Intravenous Stopped 08/13/18 0531)  ceFEPIme (MAXIPIME) 2 g in sodium chloride 0.9 % 100 mL IVPB (0 g Intravenous Stopped 08/13/18 0451)  sodium chloride 0.9 % bolus 500 mL (0 mLs Intravenous Stopped 08/13/18 0612)     Initial Impression / Assessment and Plan / ED Course  I have reviewed the triage vital signs and the nursing notes.  Pertinent labs & imaging results that were available during my care of the patient were reviewed by me and  considered in my medical decision making (see chart for details).  57 year old female presents with bilateral stump pain. Initial temp was 100.3 and she is tachycardic and hypertensive. Stumps were examined and do not look like there is an acute infection at this time. Will obtain  labs and give pain control  CBC is remarkable marked leukocytosis of 27 and she is slightly more anemic. CMP is remarkable for multiple derangements. Of note her LFTs and bilirubin are elevated. AST is 393, ALT is 187, bilirubin is 5.2. It appears she had a recent work up for a similar issue a couple weeks ago without an obvious etiology. Sepsis protocol was utilized and she was given broad spectrum antibiotics. CXR is negative. UA does not show obvious source. Shared visit with Dr. Tyrone Nine. Spoke with Dr. Marlowe Sax who will admit.  Final Clinical Impressions(s) / ED Diagnoses   Final diagnoses:  Sepsis, due to unspecified organism, unspecified whether acute organ dysfunction present [facility])  Elevated LFTs    ED Discharge Orders    None       Recardo Evangelist, PA-C 08/13/18 [place], [place], DO 08/13/18 0655    Recardo Evangelist, PA-C 08/13/18 [place], [place], DO 08/13/18 [phone]

## 2018-08-13 NOTE — ED Notes (Signed)
Several alevyn dressings removed directly from patients skin wounds; this was very painful to patient; new dressings applied with nonstick telfa applied to open wounds to prevent adhereing and causing pain, alevyn dressings applied over telfa and wrapped with kerlex

## 2018-08-14 LAB — PROCALCITONIN: Procalcitonin: 6.28 ng/mL

## 2018-08-14 LAB — CBC
HCT: 32.2 % — ABNORMAL LOW (ref 36.0–46.0)
Hemoglobin: 9.8 g/dL — ABNORMAL LOW (ref 12.0–15.0)
MCH: 26.1 pg (ref 26.0–34.0)
MCHC: 30.4 g/dL (ref 30.0–36.0)
MCV: 85.9 fL (ref 80.0–100.0)
NRBC: 0 % (ref 0.0–0.2)
PLATELETS: 220 10*3/uL (ref 150–400)
RBC: 3.75 MIL/uL — AB (ref 3.87–5.11)
RDW: 19.9 % — AB (ref 11.5–15.5)
WBC: 17.2 10*3/uL — AB (ref 4.0–10.5)

## 2018-08-14 LAB — PROTIME-INR
INR: 3.06
PROTHROMBIN TIME: 31.2 s — AB (ref 11.4–15.2)

## 2018-08-14 LAB — COMPREHENSIVE METABOLIC PANEL
ALK PHOS: 98 U/L (ref 38–126)
ALT: 496 U/L — AB (ref 0–44)
AST: 927 U/L — ABNORMAL HIGH (ref 15–41)
Albumin: 2.8 g/dL — ABNORMAL LOW (ref 3.5–5.0)
Anion gap: 8 (ref 5–15)
BILIRUBIN TOTAL: 4.3 mg/dL — AB (ref 0.3–1.2)
BUN: 40 mg/dL — ABNORMAL HIGH (ref 6–20)
CALCIUM: 8.2 mg/dL — AB (ref 8.9–10.3)
CO2: 17 mmol/L — AB (ref 22–32)
CREATININE: 1.09 mg/dL — AB (ref 0.44–1.00)
Chloride: 109 mmol/L (ref 98–111)
GFR, EST NON AFRICAN AMERICAN: 56 mL/min — AB (ref >60)
Glucose, Bld: 140 mg/dL — ABNORMAL HIGH (ref 70–99)
Potassium: 4.4 mmol/L (ref 3.5–5.1)
SODIUM: 134 mmol/L — AB (ref 135–145)
TOTAL PROTEIN: 5.8 g/dL — AB (ref 6.5–8.1)

## 2018-08-14 LAB — CORTISOL-AM, BLOOD: Cortisol - AM: 23.3 ug/dL — ABNORMAL HIGH (ref 6.7–22.6)

## 2018-08-14 LAB — GLUCOSE, CAPILLARY
GLUCOSE-CAPILLARY: 111 mg/dL — AB (ref 70–99)
Glucose-Capillary: 199 mg/dL — ABNORMAL HIGH (ref 70–99)

## 2018-08-14 MED ORDER — SODIUM CHLORIDE 0.9 % IV SOLN
1.0000 g | INTRAVENOUS | Status: DC
  Administered 2018-08-15 – 2018-08-19 (×5): 1 g via INTRAVENOUS
  Filled 2018-08-14 (×6): qty 1

## 2018-08-14 MED ORDER — OXYCODONE HCL 5 MG PO TABS
5.0000 mg | ORAL_TABLET | ORAL | Status: DC | PRN
  Administered 2018-08-14 – 2018-08-17 (×11): 5 mg via ORAL
  Filled 2018-08-14 (×11): qty 1

## 2018-08-14 MED ORDER — TRAMADOL HCL 50 MG PO TABS
50.0000 mg | ORAL_TABLET | Freq: Four times a day (QID) | ORAL | Status: DC | PRN
  Administered 2018-08-14 – 2018-08-19 (×2): 50 mg via ORAL
  Filled 2018-08-14 (×2): qty 1

## 2018-08-14 MED ORDER — SODIUM CHLORIDE 0.9 % IV SOLN
INTRAVENOUS | Status: DC
  Administered 2018-08-14 – 2018-08-17 (×4): via INTRAVENOUS

## 2018-08-14 MED ORDER — ACETAMINOPHEN 325 MG PO TABS
650.0000 mg | ORAL_TABLET | Freq: Four times a day (QID) | ORAL | Status: DC | PRN
  Administered 2018-08-15: 650 mg via ORAL
  Filled 2018-08-14: qty 2

## 2018-08-14 NOTE — Progress Notes (Signed)
Pharmacy Antibiotic Note  [name] is a 57 y.o. female admitted on 08/13/2018 with sepsis of unclear etiology. Pt has hx of bilateral AKAs with chronic stump wounds as possible source. Pharmacy has been consulted for Vancomycin/Cefepime dosing. Leukocytosis improving, pt remains afebrile, SCr 1.09 (baseline ~0.9).   Plan: -Continue vancomycin 1049m IV q24h -Reduce cefepime to 1g IV q24h -Monitor renal function closely -Will obtain vancomycin trough prior to 4th dose  Temp (24hrs), Avg:97.9 F (36.6 C), Min:97.8 F (36.6 C), Max:98 F (36.7 C)  Recent Labs  Lab 08/11/18 0101 08/13/18 0245 08/13/18 0255 08/13/18 0357 08/13/18 0546 08/13/18 0810 08/13/18 1008 08/14/18 0401  WBC 10.7* 27.0*  --   --   --   --   --  17.2*  CREATININE 0.97  --  1.18*  --   --   --   --  1.09*  LATICACIDVEN  --   --   --  4.35* 4.18* 4.3* 2.7*  --     Estimated Creatinine Clearance: 43.3 mL/min (A) (by C-G formula based on SCr of 1.09 mg/dL (H)).    Allergies  Allergen Reactions  . Acetaminophen Swelling and Other (See Comments)    Reaction:  Eyelid swelling  . Haldol [Haloperidol Lactate]     Possible eye swelling was given together with Toradol unsure if caused the reaction  . Lisinopril     UNSPECIFIED REACTION   . Toradol [Ketorolac Tromethamine]     Eye swelling    Antimicrobials: Vancomycin 11/9 >> Cefepime 11/9 >> Metronidazole 11/9 x1  Microbiology: 11/9 BCx: sent   MArrie Senate PharmD, BCPS Clinical Pharmacist [phone] check AMION for all MGlendalenumbers 08/14/2018

## 2018-08-14 NOTE — Progress Notes (Signed)
[facility] TEAM 1 - Stepdown/ICU TEAM  [name]  MIW:[id number] DOB: [birth year] DOA: 08/13/2018 PCP: Azzie Glatter, FNP    Brief Narrative:  57 y.o. female with a hx of Levimisole vasculitis, RA, anemia, migraine, HTN, depression, and cocaine use who presented for B LE pain.  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  In the ED she was found to have an elevated WBC, lactic acidosis, temperature of 100.3 and there was concern for sepsis. She last smoked crack 1 day prior, and is a daily tobacco smoker.     Subjective: Resting comfortably. No resp distress. No uncontrolled pain.   Assessment & Plan:  Sepsis v/s SIRS, unknown source UA not consistent with infection - CXR showed a small nodular density which could simply be a nipple shadow - has chronic skin breakdown on her stumps B w/ Xrays suggestive of OM (but these changes) - will need MRI of stumps if no other source found - repeat CXR in AM after pt rehydrated  Acute kidney injury crt slowly improving - cont to volume resuscitate and monitor trend  Recent Labs  Lab 08/11/18 0101 08/13/18 0255 08/14/18 0401  CREATININE 0.97 1.18* 1.09*   Elevated LFTs Workup at the end of October revealed negative hepatitis labs and CT noted hepatic congestion - DDx includes ETOH induced hepatitis vs hepatic congestion from diastolic CHF v/s shock liver - follow trend  Recent Labs  Lab 08/11/18 0101 08/13/18 0255 08/14/18 0401  AST 32 393* 927*  ALT 39 187* 496*  ALKPHOS 90 101 98  BILITOT 2.9* 5.2* 4.3*  PROT 6.1* 6.5 5.8*  ALBUMIN 2.9* 3.1* 2.8*    Chronic diastolic CHF Volume depleted at this time - cont to hydrate and follow     Essential hypertension BP stable at this time     Cocaine-induced vascular disorder   Protein-calorie malnutrition, severe   Tobacco abuse    S/P  B LE amputations with ulcers of stumps Cont local wound care - is on broad abx coverage  DVT prophylaxis: lovenox  Code Status: FULL CODE Family Communication: no family present at time of exam  Disposition Plan: SDU  Consultants:  none  Antimicrobials:  Cefepime 11/8 > Vanc 11/8   Objective: Blood pressure (!) 138/95, pulse 93, temperature 98 F (36.7 C), temperature source Axillary, resp. rate (!) 25, SpO2 95 %.  Intake/Output Summary (Last 24 hours) at 08/14/2018 1506 Last data filed at 08/13/2018 1800 Gross per 24 hour  Intake 375 ml  Output -  Net 375 ml   There were no vitals filed for this visit.  Examination: General: No acute respiratory distress Lungs: Clear to auscultation bilaterally without wheezes or crackles Cardiovascular: Regular rate and rhythm without murmur gallop or rub normal S1 and S2 Abdomen: Nondistended, soft, bowel sounds positive, no rebound, no ascites Extremities: No significant edema bilateral lower extremities  CBC: Recent Labs  Lab 08/11/18 0101 08/13/18 0245 08/14/18 0401  WBC 10.7* 27.0* 17.2*  NEUTROABS 7.6 23.7*  --   HGB 10.2* 9.8* 9.8*  HCT 32.3* 33.2* 32.2*  MCV 81.8 86.7 85.9  PLT 335 293 250   Basic Metabolic Panel: Recent Labs  Lab 08/11/18 0101 08/13/18 0255 08/14/18 0401  NA 130* 133* 134*  K 3.7 5.2* 4.4  CL 101 103 109  CO2 16* 14* 17*  GLUCOSE 100*  58* 140*  BUN 21* 39* 40*  CREATININE 0.97 1.18* 1.09*  CALCIUM 8.7* 8.8* 8.2*   GFR: Estimated Creatinine Clearance: 43.3 mL/min (A) (by C-G formula based on SCr of 1.09 mg/dL (H)).  Liver Function Tests: Recent Labs  Lab 08/11/18 0101 08/13/18 0255 08/14/18 0401  AST 32 393* 927*  ALT 39 187* 496*  ALKPHOS 90 101 98  BILITOT 2.9* 5.2* 4.3*  PROT 6.1* 6.5 5.8*  ALBUMIN 2.9* 3.1* 2.8*   Recent Labs  Lab 08/11/18 0101  LIPASE 25    Coagulation Profile: Recent Labs  Lab 08/14/18 0401  INR 3.06    HbA1C: Hemoglobin A1C  Date/Time Value  Ref Range Status  05/30/2018 01:41 PM 5.0 4.0 - 5.6 % Final   Hgb A1c MFr Bld  Date/Time Value Ref Range Status  03/30/2017 07:44 PM 5.5 4.8 - 5.6 % Final    Comment:    (NOTE)         Pre-diabetes: 5.7 - 6.4         Diabetes: >6.4         Glycemic control for adults with diabetes: <7.0   04/21/2015 04:08 PM 5.1 4.8 - 5.6 % Final    Comment:    (NOTE)         Pre-diabetes: 5.7 - 6.4         Diabetes: >6.4         Glycemic control for adults with diabetes: <7.0     CBG: Recent Labs  Lab 08/13/18 0814 08/14/18 0756 08/14/18 1129  GLUCAP 80 111* 199*    Recent Results (from the past 240 hour(s))  Blood culture (routine x 2)     Status: None (Preliminary result)   Collection Time: 08/13/18  3:35 AM  Result Value Ref Range Status   Specimen Description SITE NOT SPECIFIED  Final   Special Requests   Final    BOTTLES DRAWN AEROBIC ONLY Blood Culture adequate volume   Culture   Final    NO GROWTH 1 DAY Performed at [facility], [place] [address] [place], [place] [address]    Report Status PENDING  Incomplete  Blood culture (routine x 2)     Status: None (Preliminary result)   Collection Time: 08/13/18  4:15 AM  Result Value Ref Range Status   Specimen Description BLOOD RIGHT ELBOW  Final   Special Requests   Final    BOTTLES DRAWN AEROBIC AND ANAEROBIC Blood Culture adequate volume   Culture   Final    NO GROWTH 1 DAY Performed at [facility], [place] [address] [place], [place] [address]    Report Status PENDING  Incomplete     Scheduled Meds: . enoxaparin (LOVENOX) injection  40 mg Subcutaneous Daily     LOS: 1 day   Cherene Altes, MD [facility] Office  [phone] Pager - Text Page per Amion  If 7PM-7AM, please contact night-coverage per Amion 08/14/2018, 3:06 PM

## 2018-08-15 ENCOUNTER — Inpatient Hospital Stay (HOSPITAL_COMMUNITY): Payer: Medicaid Other

## 2018-08-15 LAB — COMPREHENSIVE METABOLIC PANEL
ALBUMIN: 2.6 g/dL — AB (ref 3.5–5.0)
ALT: 670 U/L — ABNORMAL HIGH (ref 0–44)
AST: 941 U/L — AB (ref 15–41)
Alkaline Phosphatase: 103 U/L (ref 38–126)
Anion gap: 6 (ref 5–15)
BILIRUBIN TOTAL: 4 mg/dL — AB (ref 0.3–1.2)
BUN: 30 mg/dL — AB (ref 6–20)
CO2: 18 mmol/L — AB (ref 22–32)
Calcium: 8.1 mg/dL — ABNORMAL LOW (ref 8.9–10.3)
Chloride: 111 mmol/L (ref 98–111)
Creatinine, Ser: 0.84 mg/dL (ref 0.44–1.00)
GFR calc Af Amer: >60 mL/min (ref >60)
GFR calc non Af Amer: >60 mL/min (ref >60)
Glucose, Bld: 131 mg/dL — ABNORMAL HIGH (ref 70–99)
POTASSIUM: 4.1 mmol/L (ref 3.5–5.1)
SODIUM: 135 mmol/L (ref 135–145)
Total Protein: 5.8 g/dL — ABNORMAL LOW (ref 6.5–8.1)

## 2018-08-15 LAB — CBC
HEMATOCRIT: 32.9 % — AB (ref 36.0–46.0)
HEMOGLOBIN: 9.8 g/dL — AB (ref 12.0–15.0)
MCH: 25.5 pg — AB (ref 26.0–34.0)
MCHC: 29.8 g/dL — AB (ref 30.0–36.0)
MCV: 85.7 fL (ref 80.0–100.0)
Platelets: 154 10*3/uL (ref 150–400)
RBC: 3.84 MIL/uL — ABNORMAL LOW (ref 3.87–5.11)
RDW: 20.1 % — ABNORMAL HIGH (ref 11.5–15.5)
WBC: 11.3 10*3/uL — ABNORMAL HIGH (ref 4.0–10.5)
nRBC: 0.2 % (ref 0.0–0.2)

## 2018-08-15 LAB — LACTIC ACID, PLASMA: Lactic Acid, Venous: 2.1 mmol/L (ref 0.5–1.9)

## 2018-08-15 MED ORDER — GABAPENTIN 100 MG PO CAPS
100.0000 mg | ORAL_CAPSULE | Freq: Three times a day (TID) | ORAL | Status: DC
  Administered 2018-08-15 – 2018-08-16 (×2): 100 mg via ORAL
  Filled 2018-08-15 (×2): qty 1

## 2018-08-15 MED ORDER — AMLODIPINE BESYLATE 10 MG PO TABS
10.0000 mg | ORAL_TABLET | Freq: Every day | ORAL | Status: DC
  Administered 2018-08-15 – 2018-08-26 (×12): 10 mg via ORAL
  Filled 2018-08-15 (×12): qty 1

## 2018-08-15 NOTE — Progress Notes (Signed)
[name]  [name]  EVO:[id number] DOB: 09-18-1961 DOA: 08/13/2018 PCP: Azzie Glatter, FNP    Brief Narrative:  57 y.o. female with a hx of Levimisole vasculitis, RA, anemia, migraine, HTN, depression, and cocaine use who presented for B LE pain.  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  In the ED she was found to have an elevated WBC, lactic acidosis, temperature of 100.3 and there was concern for sepsis. She last smoked crack 1 day prior, and is a daily tobacco smoker.     Subjective: Slightly more alert today, but remains quite somnolent. C/o pain in her stumps B. Denies SOB, n/v, or abdom pain.   Assessment & Plan:  Sepsis v/s SIRS, unknown source UA not consistent with infection - admit CXR showed a small nodular density but f/u CXR 11/11 revealed only minimal atx - has chronic skin breakdown on her stumps B w/ Xrays suggestive of OM - will need MRI of stumps 11/12 if no other source found - clinically responding to empiric broad spectrum abx   Acute kidney injury crt has responded well to volume resuscitation   Recent Labs  Lab 08/11/18 0101 08/13/18 0255 08/14/18 0401 08/15/18 0356  CREATININE 0.97 1.18* 1.09* 0.84   Elevated LFTs Workup at the end of October revealed negative hepatitis labs and CT noted hepatic congestion - DDx includes ETOH induced hepatitis vs hepatic congestion from diastolic CHF v/s shock liver - follow trend w/ no improvement thus far - will have to consider imaging liver to r/o hepatic abscess   Recent Labs  Lab 08/11/18 0101 08/13/18 0255 08/14/18 0401 08/15/18 0356  AST 32 393* 927* 941*  ALT 39 187* 496* 670*  ALKPHOS 90 101 98 103  BILITOT 2.9* 5.2* 4.3* 4.0*  PROT 6.1* 6.5 5.8* 5.8*  ALBUMIN 2.9* 3.1* 2.8* 2.6*    Chronic diastolic CHF cont to hydrate  and follow - no sign of volume overload presently   Essential hypertension BP now climbing w/ volume expansion - resume medical tx      Cocaine-induced vascular disorder   Protein-calorie malnutrition, severe   Tobacco abuse    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 rate 18, SpO2 95 %.  Intake/Output Summary (Last 24 hours) at 08/15/2018 1508 Last data filed at 08/15/2018 1300 Gross per 24 hour  Intake 350 ml  Output 500 ml  Net -150 ml   There were no vitals filed for this visit.  Examination: General: No acute respiratory distress - somnolent  Lungs: CTA B - no wheezing  Cardiovascular: RRR - no M or rub  Abdomen: Nondistended, soft, bowel sounds positive, no rebound, no ascites Extremities: No significant edema bilateral lower stumps - wounds dressed - inspected by WOC today   CBC: Recent Labs  Lab 08/11/18 0101 08/13/18 0245 08/14/18 0401 08/15/18 0356  WBC 10.7* 27.0* 17.2* 11.3*  NEUTROABS 7.6 23.7*  --   --   HGB 10.2* 9.8* 9.8*  9.8*  HCT 32.3* 33.2* 32.2* 32.9*  MCV 81.8 86.7 85.9 85.7  PLT 335 293 220 297   Basic Metabolic Panel: Recent Labs  Lab 08/13/18 0255 08/14/18 0401 08/15/18 0356  NA 133* 134* 135  K 5.2* 4.4 4.1  CL 103 109 111  CO2 14* 17* 18*  GLUCOSE 58* 140* 131*  BUN 39* 40* 30*  CREATININE 1.18* 1.09* 0.84  CALCIUM 8.8* 8.2* 8.1*   GFR: Estimated Creatinine Clearance: 56.1 mL/min (by C-G formula based on SCr of 0.84 mg/dL).  Liver Function Tests: Recent Labs  Lab 08/11/18 0101 08/13/18 0255 08/14/18 0401 08/15/18 0356  AST 32 393* 927* 941*  ALT 39 187*  496* 670*  ALKPHOS 90 101 98 103  BILITOT 2.9* 5.2* 4.3* 4.0*  PROT 6.1* 6.5 5.8* 5.8*  ALBUMIN 2.9* 3.1* 2.8* 2.6*   Recent Labs  Lab 08/11/18 0101  LIPASE 25    Coagulation Profile: Recent Labs  Lab 08/14/18 0401  INR 3.06    HbA1C: Hemoglobin A1C  Date/Time Value Ref Range Status  05/30/2018 01:41 PM 5.0 4.0 - 5.6 % Final   Hgb A1c MFr Bld  Date/Time Value Ref Range Status  03/30/2017 07:44 PM 5.5 4.8 - 5.6 % Final    Comment:    (NOTE)         Pre-diabetes: 5.7 - 6.4         Diabetes: >6.4         Glycemic control for adults with diabetes: <7.0   04/21/2015 04:08 PM 5.1 4.8 - 5.6 % Final    Comment:    (NOTE)         Pre-diabetes: 5.7 - 6.4         Diabetes: >6.4         Glycemic control for adults with diabetes: <7.0     CBG: Recent Labs  Lab 08/13/18 0814 08/14/18 0756 08/14/18 1129  GLUCAP 80 111* 199*    Recent Results (from the past 240 hour(s))  Blood culture (routine x 2)     Status: None (Preliminary result)   Collection Time: 08/13/18  3:35 AM  Result Value Ref Range Status   Specimen Description SITE NOT SPECIFIED  Final   Special Requests   Final    BOTTLES DRAWN AEROBIC ONLY Blood Culture adequate volume   Culture   Final    NO GROWTH 2 DAYS Performed at [facility], [place] [address], [place], [place] [address]    Report Status PENDING  Incomplete  Blood culture (routine x 2)     Status: None (Preliminary result)   Collection Time: 08/13/18  4:15 AM  Result Value Ref Range Status   Specimen Description BLOOD RIGHT ELBOW  Final   Special Requests   Final    BOTTLES DRAWN AEROBIC AND ANAEROBIC Blood Culture adequate volume   Culture   Final    NO GROWTH 2 DAYS Performed at [facility], [place] [address], [place], [place] [address]    Report Status PENDING  Incomplete     Scheduled Meds: . enoxaparin (LOVENOX) injection  40 mg Subcutaneous Daily     LOS: 2 days   Cherene Altes, MD [facility] Office   [phone] Pager - Text Page per Amion  If 7PM-7AM, please contact night-coverage per Amion 08/15/2018, 3:08 PM

## 2018-08-15 NOTE — Consult Note (Addendum)
[place] Nurse wound consult note Reason for Consult: Consult requested for bilat stumps. Pt is familiar to [facility] team from multiple previous admissions. 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  Pt had bilat AKA surgery in May, 2019. She admits that she is still using cocaine. These wounds are very painful and will not heal while patient is continuing cocaine use; she has stated she is aware this is the case in the past.  Topical treatment is directed towards minimizing occurrence of pain and bleeding with dressing changes, and preventing further injury to the sites. Reviewed photos and progress notes in the EMR. Wound type: Full thickness wounds over previous surgical sites to stumps which were previously healed. Patchy areas of dark purple skin changes and full thickness wounds witch are 50% red, 50% eschar. Dressing procedure/placement/frequency: Xeroform gauze to bilat stumps for dehisced wounds. If source of sepsis is unknown, then pt could benefit from MRI to R/O osteomyelitis. Dr Sharol Given performed previous amputation surgeries; please refer to him for further plan of care for bilat stumps.  Please re-consult if further assistance is needed.  Thank-you,  Julien Girt MSN, [place], [place], [place], [place]

## 2018-08-16 ENCOUNTER — Inpatient Hospital Stay (HOSPITAL_COMMUNITY): Payer: Medicaid Other

## 2018-08-16 LAB — COMPREHENSIVE METABOLIC PANEL
ALT: 610 U/L — ABNORMAL HIGH (ref 0–44)
ANION GAP: 8 (ref 5–15)
AST: 616 U/L — AB (ref 15–41)
Albumin: 2.6 g/dL — ABNORMAL LOW (ref 3.5–5.0)
Alkaline Phosphatase: 103 U/L (ref 38–126)
BILIRUBIN TOTAL: 4.3 mg/dL — AB (ref 0.3–1.2)
BUN: 27 mg/dL — ABNORMAL HIGH (ref 6–20)
CALCIUM: 8.1 mg/dL — AB (ref 8.9–10.3)
CO2: 16 mmol/L — AB (ref 22–32)
Chloride: 110 mmol/L (ref 98–111)
Creatinine, Ser: 0.88 mg/dL (ref 0.44–1.00)
GFR calc Af Amer: >60 mL/min (ref >60)
GLUCOSE: 80 mg/dL (ref 70–99)
POTASSIUM: 4.4 mmol/L (ref 3.5–5.1)
Sodium: 134 mmol/L — ABNORMAL LOW (ref 135–145)
Total Protein: 6.1 g/dL — ABNORMAL LOW (ref 6.5–8.1)

## 2018-08-16 LAB — CBC
HCT: 34.5 % — ABNORMAL LOW (ref 36.0–46.0)
HEMOGLOBIN: 10.1 g/dL — AB (ref 12.0–15.0)
MCH: 25.3 pg — ABNORMAL LOW (ref 26.0–34.0)
MCHC: 29.3 g/dL — ABNORMAL LOW (ref 30.0–36.0)
MCV: 86.5 fL (ref 80.0–100.0)
NRBC: 0.1 % (ref 0.0–0.2)
PLATELETS: 92 10*3/uL — AB (ref 150–400)
RBC: 3.99 MIL/uL (ref 3.87–5.11)
RDW: 20.1 % — ABNORMAL HIGH (ref 11.5–15.5)
WBC: 14 10*3/uL — ABNORMAL HIGH (ref 4.0–10.5)

## 2018-08-16 LAB — PHOSPHORUS: Phosphorus: 1.8 mg/dL — ABNORMAL LOW (ref 2.5–4.6)

## 2018-08-16 LAB — AMMONIA: AMMONIA: 32 umol/L (ref 9–35)

## 2018-08-16 LAB — MAGNESIUM: MAGNESIUM: 2 mg/dL (ref 1.7–2.4)

## 2018-08-16 LAB — VITAMIN B12: VITAMIN B 12: 3006 pg/mL — AB (ref 180–914)

## 2018-08-16 LAB — FOLATE: Folate: 18.1 ng/mL (ref >5.9)

## 2018-08-16 MED ORDER — ACETAMINOPHEN 325 MG PO TABS: 325.0000 mg | ORAL_TABLET | Freq: Four times a day (QID) | ORAL | Status: DC | PRN

## 2018-08-16 NOTE — Progress Notes (Addendum)
[place] TEAM 1 - Stepdown/ICU TEAM  [name]  HGD:[id number] DOB: August 07, 1961 DOA: 08/13/2018 PCP: Azzie Glatter, FNP    Brief Narrative:  57 y.o. female with a hx of Levimisole vasculitis, RA, anemia, migraine, HTN, depression, and cocaine use who presented for B LE pain.  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  In the ED she was found to have an elevated WBC, lactic acidosis, temperature of 100.3 and there was concern for sepsis. She last smoked crack 1 day prior, and is a daily tobacco smoker.     Subjective: Remains quite altered. Will awaken to loud voice, but is not able to clearly communicate w/ me. She can't tell me where she is, but knows it is [place]. She c/o severe pain in her lower extremities. She keeps falling asleep when trying to eat or drink.   Assessment & Plan:  Sepsis v/s SIRS, unknown source UA not consistent with infection - admit CXR showed a small nodular density but f/u CXR 11/11 revealed only minimal atx - has chronic skin breakdown on her stumps B w/ Xrays suggestive of OM - will need MRI of stumps when more stable mental status - was initially clinically responding to empiric broad spectrum abx but now temp and WBC appear to be trending back up  Altered mental status ?CNS event such as ICH or CVA - ?metabolic - ?withdrawal - check CT head to r/o ICH if ammonia level is not elevated - metabolic eval - avoid sedatives as able   Acute kidney injury crt has responded well to volume resuscitation and remains normal   Elevated LFTs Workup at the end of October revealed negative hepatitis labs and CT noted hepatic congestion - DDx includes ETOH induced hepatitis vs hepatic congestion from diastolic CHF v/s shock liver - LFTs now improving - check [place] to r/o evidence of stone or obstruction / cholangitis, though abdom  not tender on exam     Recent Labs  Lab 08/11/18 0101 08/13/18 0255 08/14/18 0401 08/15/18 0356 08/16/18 0335  AST 32 393* 927* 941* 616*  ALT 39 187* 496* 670* 610*  ALKPHOS 90 101 98 103 103  BILITOT 2.9* 5.2* 4.3* 4.0* 4.3*  PROT 6.1* 6.5 5.8* 5.8* 6.1*  ALBUMIN 2.9* 3.1* 2.8* 2.6* 2.6*    Chronic diastolic CHF cont to hydrate and follow - no sign of volume overload   Essential hypertension BP now climbing w/ volume expansion - resume medical tx      Cocaine-induced vascular disorder   Protein-calorie malnutrition, severe  Due to ongoing polysubstance abuse   Tobacco abuse    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 rate (!) 31, SpO2 97 %.  Intake/Output Summary (Last 24 hours) at 08/16/2018 1620 Last data filed at 08/16/2018 0955 Gross per 24 hour  Intake 850 ml  Output 300 ml  Net 550 ml   There were no vitals filed for this visit.  Examination: General: No acute respiratory distress - altered/somnolent  Lungs: CTA B w/o wheezing or crackles  Cardiovascular: RRR w/o appreciable M  Abdomen: Nondistended, soft, BS+, no rebound, no ascites Extremities: No significant edema bilateral lower stumps - wounds dressed  CBC: Recent Labs  Lab 08/11/18 0101 08/13/18 0245 08/14/18 0401 08/15/18 0356 08/16/18 0335  WBC 10.7* 27.0* 17.2* 11.3* 14.0*  NEUTROABS 7.6 23.7*  --   --   --   HGB 10.2* 9.8* 9.8* 9.8* 10.1*  HCT 32.3* 33.2* 32.2* 32.9* 34.5*  MCV 81.8 86.7 85.9 85.7 86.5  PLT 335 293 220 154 92*   Basic Metabolic Panel: Recent Labs  Lab 08/14/18 0401 08/15/18 0356 08/16/18 0335  NA 134*  135 134*  K 4.4 4.1 4.4  CL 109 111 110  CO2 17* 18* 16*  GLUCOSE 140* 131* 80  BUN 40* 30* 27*  CREATININE 1.09* 0.84 0.88  CALCIUM 8.2* 8.1* 8.1*  MG  --   --  2.0  PHOS  --   --  1.8*   GFR: Estimated Creatinine Clearance: 53.6 mL/min (by C-G formula based on SCr of 0.88 mg/dL).  Liver Function Tests: Recent Labs  Lab 08/13/18 0255 08/14/18 0401 08/15/18 0356 08/16/18 0335  AST 393* 927* 941* 616*  ALT 187* 496* 670* 610*  ALKPHOS 101 98 103 103  BILITOT 5.2* 4.3* 4.0* 4.3*  PROT 6.5 5.8* 5.8* 6.1*  ALBUMIN 3.1* 2.8* 2.6* 2.6*   Recent Labs  Lab 08/11/18 0101  LIPASE 25    Coagulation Profile: Recent Labs  Lab 08/14/18 0401  INR 3.06    HbA1C: Hemoglobin A1C  Date/Time Value Ref Range Status  05/30/2018 01:41 PM 5.0 4.0 - 5.6 % Final   Hgb A1c MFr Bld  Date/Time Value Ref Range Status  03/30/2017 07:44 PM 5.5 4.8 - 5.6 % Final    Comment:    (NOTE)         Pre-diabetes: 5.7 - 6.4         Diabetes: >6.4         Glycemic control for adults with diabetes: <7.0   04/21/2015 04:08 PM 5.1 4.8 - 5.6 % Final    Comment:    (NOTE)         Pre-diabetes: 5.7 - 6.4         Diabetes: >6.4         Glycemic control for adults with diabetes: <7.0     CBG: Recent Labs  Lab 08/13/18 0814 08/14/18 0756 08/14/18 1129  GLUCAP 80 111* 199*    Recent Results (from the past 240 hour(s))  Blood culture (routine x 2)     Status: None (Preliminary result)   Collection Time: 08/13/18  3:35 AM  Result Value Ref Range Status   Specimen Description SITE NOT SPECIFIED  Final   Special Requests   Final    BOTTLES DRAWN AEROBIC ONLY Blood Culture adequate volume   Culture   Final    NO GROWTH 3 DAYS Performed at [facility], [place] [address] [place], [place] [address]    Report Status PENDING  Incomplete  Blood culture (routine x 2)     Status: None (Preliminary result)   Collection Time: 08/13/18  4:15 AM  Result Value Ref Range Status   Specimen  Description BLOOD RIGHT ELBOW  Final   Special Requests   Final    BOTTLES DRAWN AEROBIC AND ANAEROBIC Blood Culture adequate volume   Culture   Final    NO GROWTH 3 DAYS Performed at  [facility], [place] [address], [place], [place] [address]    Report Status PENDING  Incomplete     Scheduled Meds: . amLODipine  10 mg Oral Daily  . enoxaparin (LOVENOX) injection  40 mg Subcutaneous Daily  . gabapentin  100 mg Oral TID     LOS: 3 days   Cherene Altes, MD [facility] Office  [phone] Pager - Text Page per Amion  If 7PM-7AM, please contact night-coverage per Amion 08/16/2018, 4:20 PM

## 2018-08-16 NOTE — Progress Notes (Signed)
CSW called and spoke with the patient's brother. He states she was unaware that the patient was in the hospital. He reports she lives with him @ [address] in [place] and but she comes and goes weeks at a time.CSW inquired about patient's wheel chair but he states he do not know where her wheelchair is located.   Thurmond Butts, [place] Social Worker [phone]

## 2018-08-17 ENCOUNTER — Inpatient Hospital Stay (HOSPITAL_COMMUNITY): Payer: Medicaid Other

## 2018-08-17 LAB — CBC
HCT: 34.5 % — ABNORMAL LOW (ref 36.0–46.0)
HEMOGLOBIN: 10.4 g/dL — AB (ref 12.0–15.0)
MCH: 25.8 pg — ABNORMAL LOW (ref 26.0–34.0)
MCHC: 30.1 g/dL (ref 30.0–36.0)
MCV: 85.6 fL (ref 80.0–100.0)
PLATELETS: 71 10*3/uL — AB (ref 150–400)
RBC: 4.03 MIL/uL (ref 3.87–5.11)
RDW: 20.4 % — AB (ref 11.5–15.5)
WBC: 13.4 10*3/uL — AB (ref 4.0–10.5)
nRBC: 0.1 % (ref 0.0–0.2)

## 2018-08-17 LAB — COMPREHENSIVE METABOLIC PANEL
ALT: 457 U/L — ABNORMAL HIGH (ref 0–44)
ANION GAP: 7 (ref 5–15)
AST: 287 U/L — ABNORMAL HIGH (ref 15–41)
Albumin: 2.5 g/dL — ABNORMAL LOW (ref 3.5–5.0)
Alkaline Phosphatase: 98 U/L (ref 38–126)
BILIRUBIN TOTAL: 4 mg/dL — AB (ref 0.3–1.2)
BUN: 21 mg/dL — ABNORMAL HIGH (ref 6–20)
CALCIUM: 8.1 mg/dL — AB (ref 8.9–10.3)
CO2: 17 mmol/L — ABNORMAL LOW (ref 22–32)
Chloride: 110 mmol/L (ref 98–111)
Creatinine, Ser: 0.69 mg/dL (ref 0.44–1.00)
GLUCOSE: 92 mg/dL (ref 70–99)
Potassium: 4.3 mmol/L (ref 3.5–5.1)
Sodium: 134 mmol/L — ABNORMAL LOW (ref 135–145)
TOTAL PROTEIN: 6 g/dL — AB (ref 6.5–8.1)

## 2018-08-17 LAB — MAGNESIUM: MAGNESIUM: 1.8 mg/dL (ref 1.7–2.4)

## 2018-08-17 LAB — RPR: RPR Ser Ql: NONREACTIVE

## 2018-08-17 LAB — AMMONIA: AMMONIA: 30 umol/L (ref 9–35)

## 2018-08-17 LAB — PHOSPHORUS: Phosphorus: 1.5 mg/dL — ABNORMAL LOW (ref 2.5–4.6)

## 2018-08-17 LAB — HIV ANTIBODY (ROUTINE TESTING W REFLEX): HIV Screen 4th Generation wRfx: NONREACTIVE

## 2018-08-17 IMAGING — DX DG TIBIA/FIBULA 2V*R*
4 series · 4 of 4 positions shown · non-contrast
Comparison: 08/12/2016

CLINICAL DATA: Leg pain and skin wounds, subsequent encounter

EXAM:
RIGHT TIBIA AND FIBULA - 2 VIEW

[t tib-fib ap right (1 of 2)]
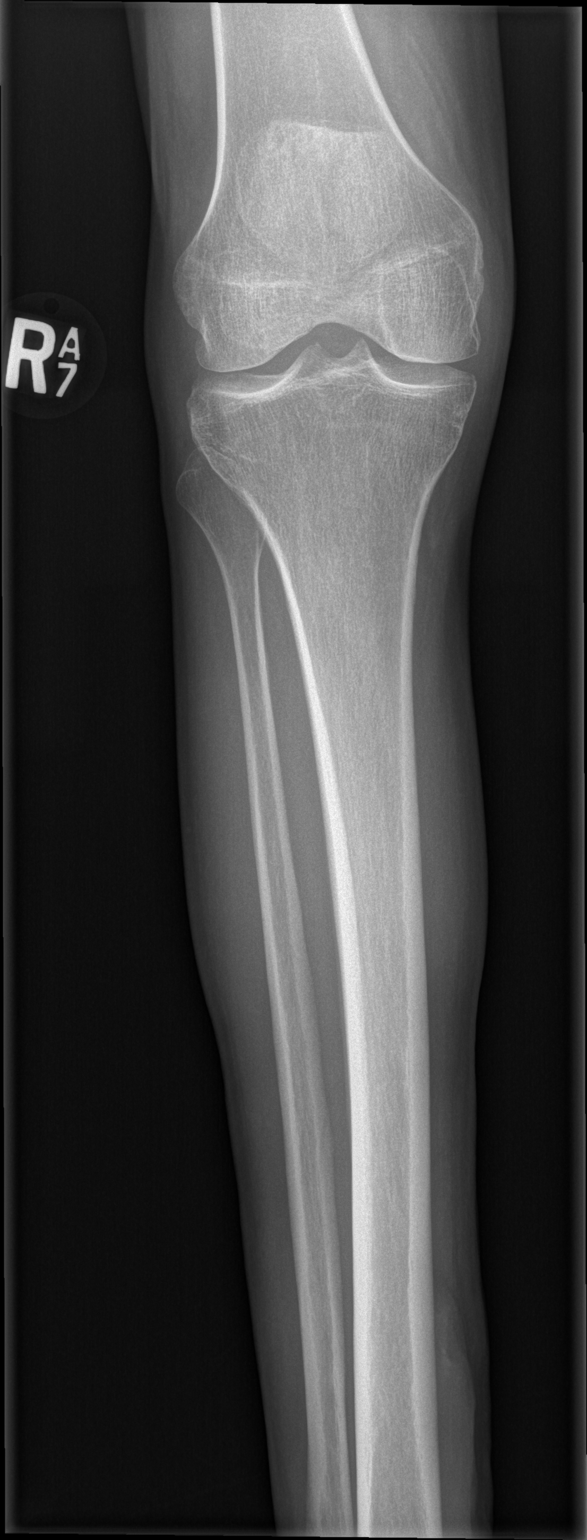

[t tib-fib ap right (2 of 2)]
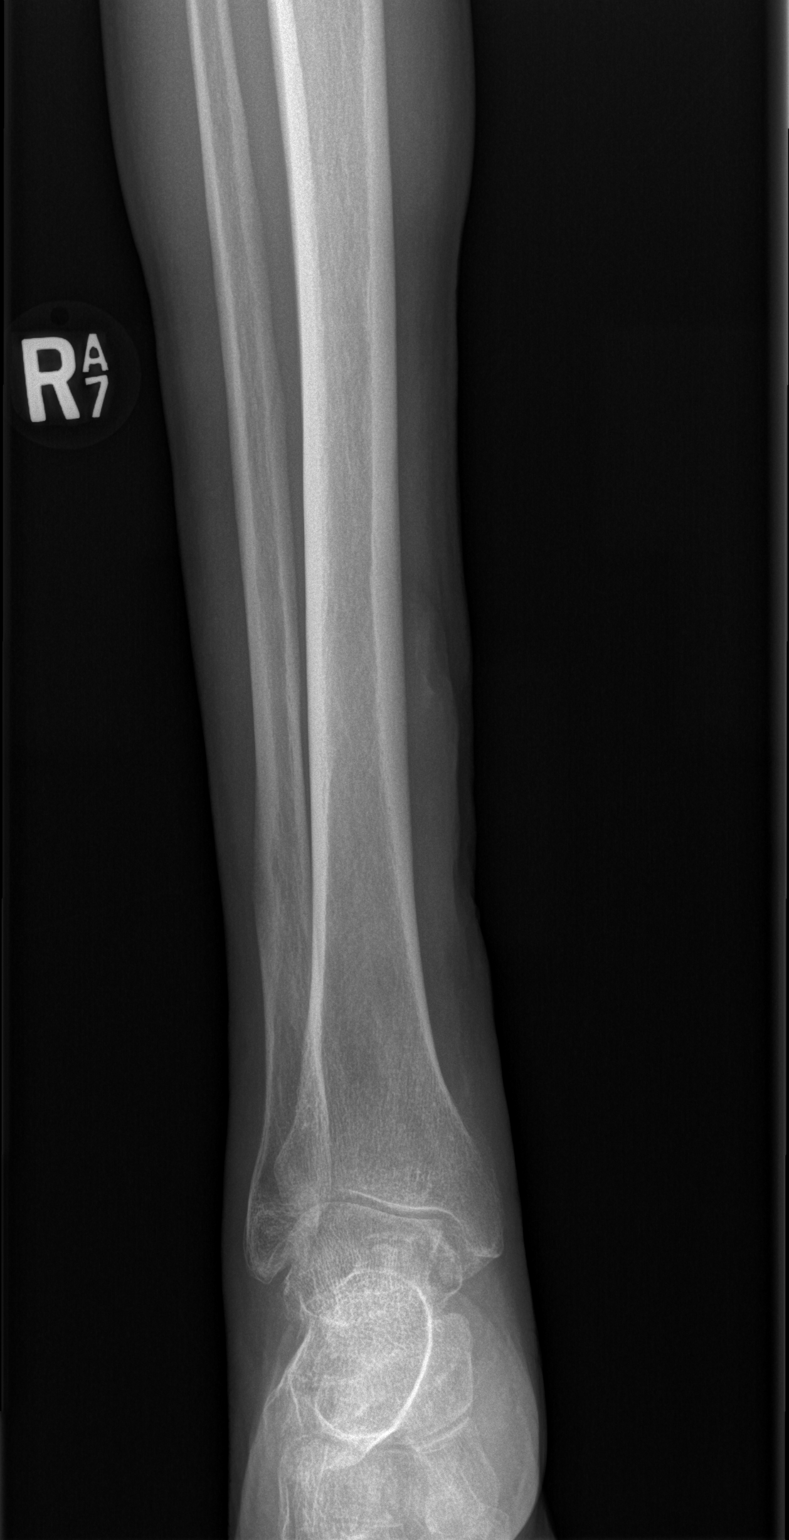

[t tib-fib lat right (1 of 2)]
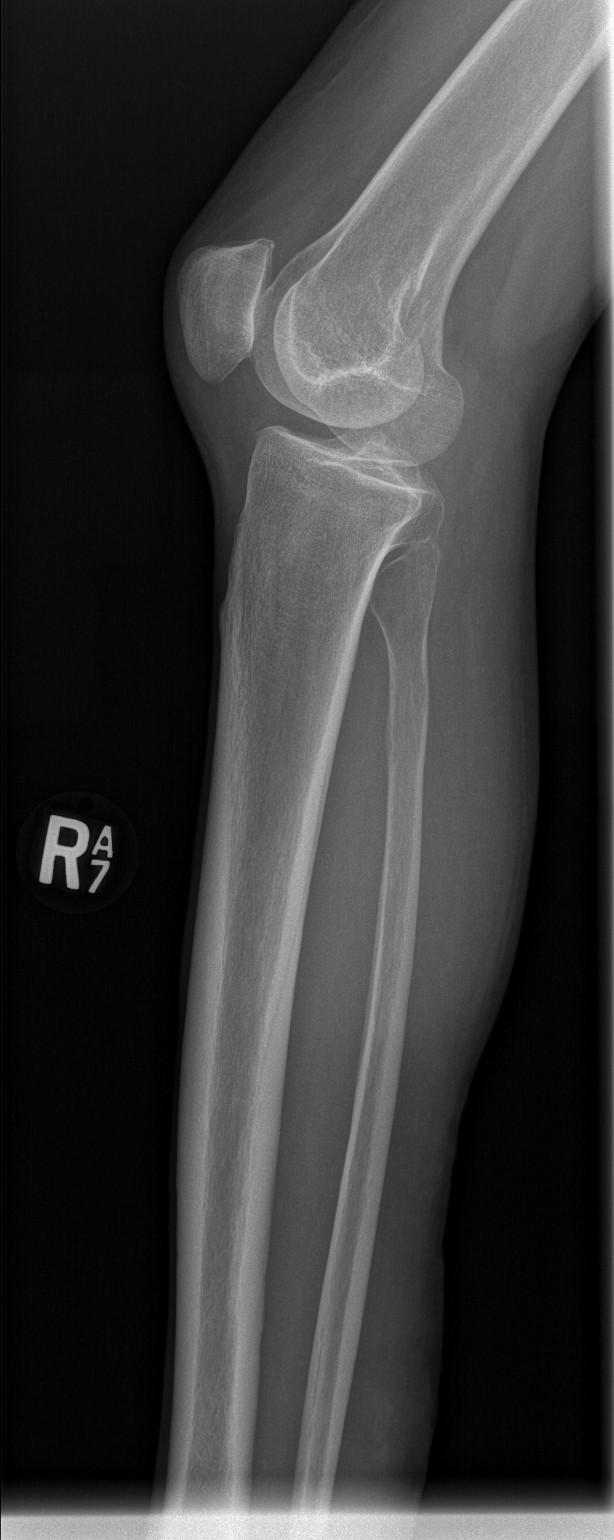

[t tib-fib lat right (2 of 2)]
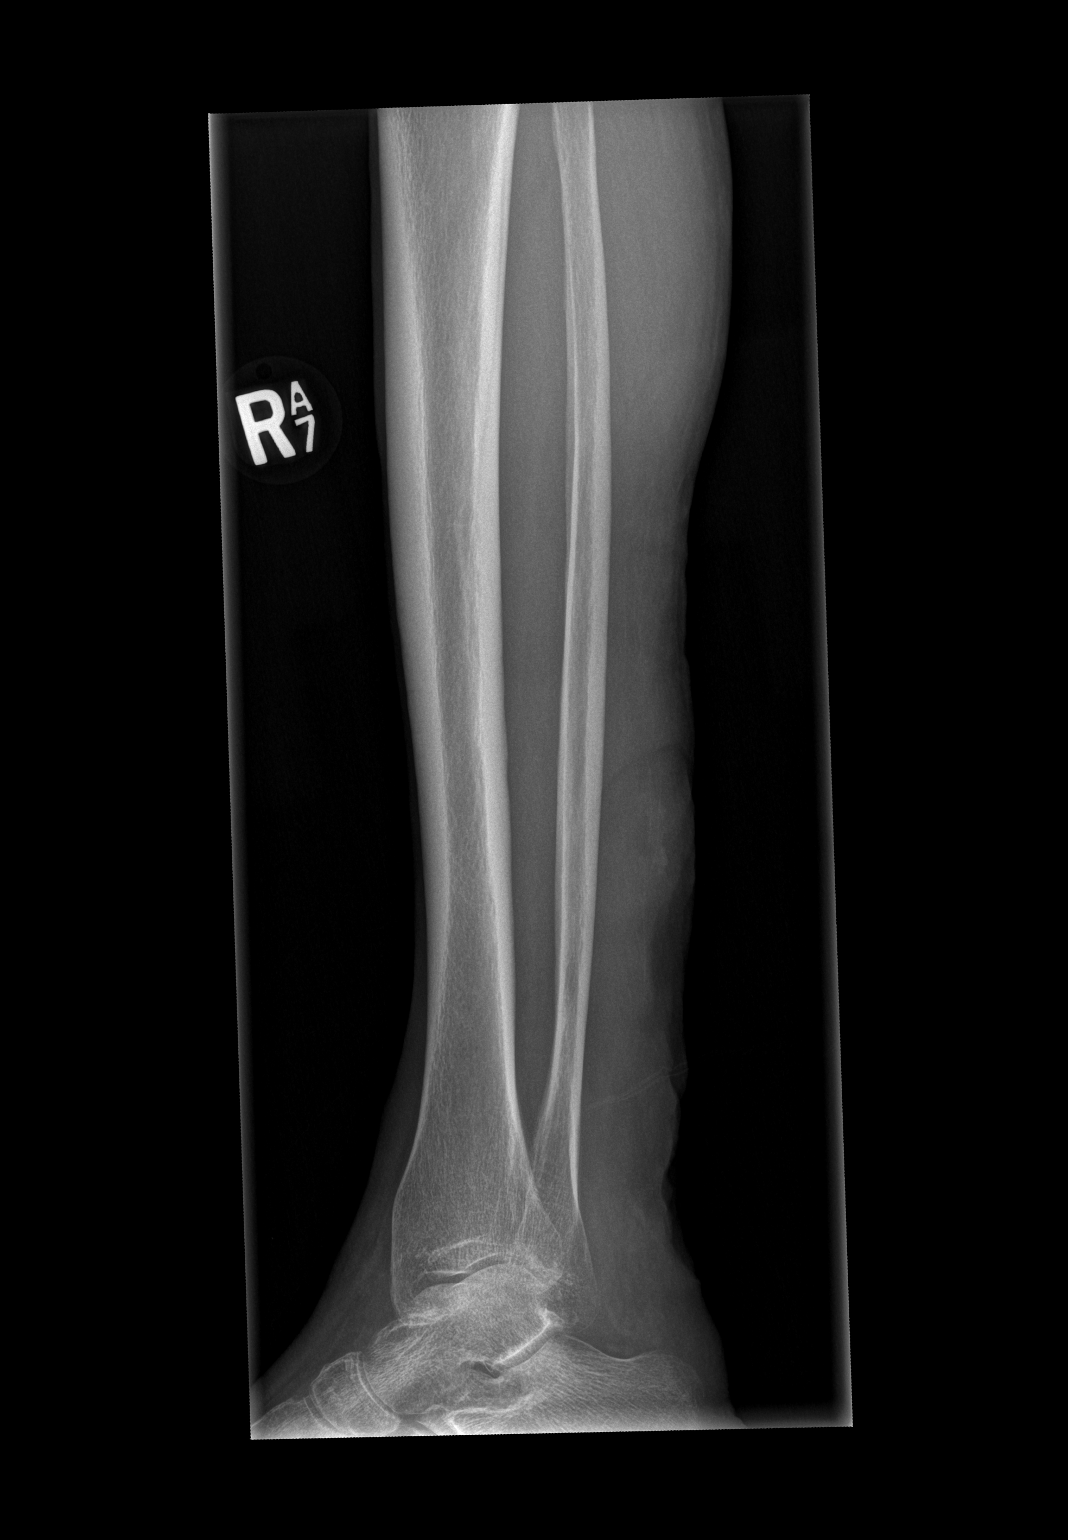

[4 of 4 positions shown; findings below may reference images not displayed]

FINDINGS: Posterior skin wound is noted distally. No underlying bony
abnormality is identified.
IMPRESSION: Soft tissue wound without acute bony abnormality.

## 2018-08-17 IMAGING — DX DG TIBIA/FIBULA 2V*L*
4 series · 4 of 4 positions shown · non-contrast
Comparison: 08/12/2016

CLINICAL DATA: Bilateral leg pain and skin wounds, initial
encounter

EXAM:
LEFT TIBIA AND FIBULA - 2 VIEW

[t tib-fib ap left (1 of 2)]
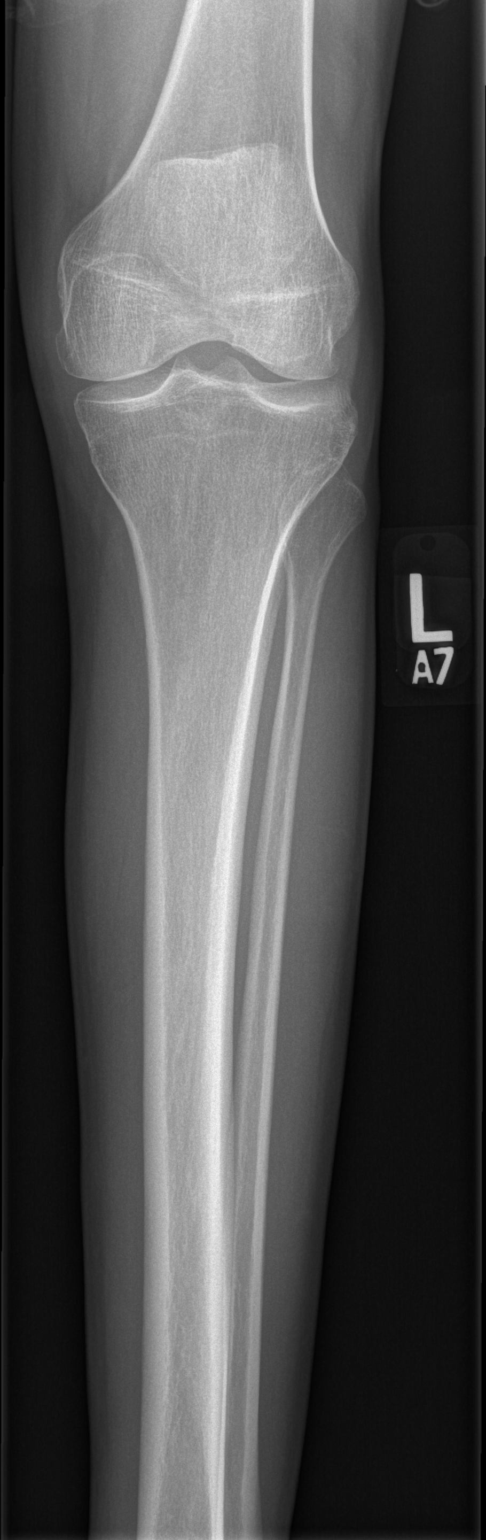

[t tib-fib ap left (2 of 2)]
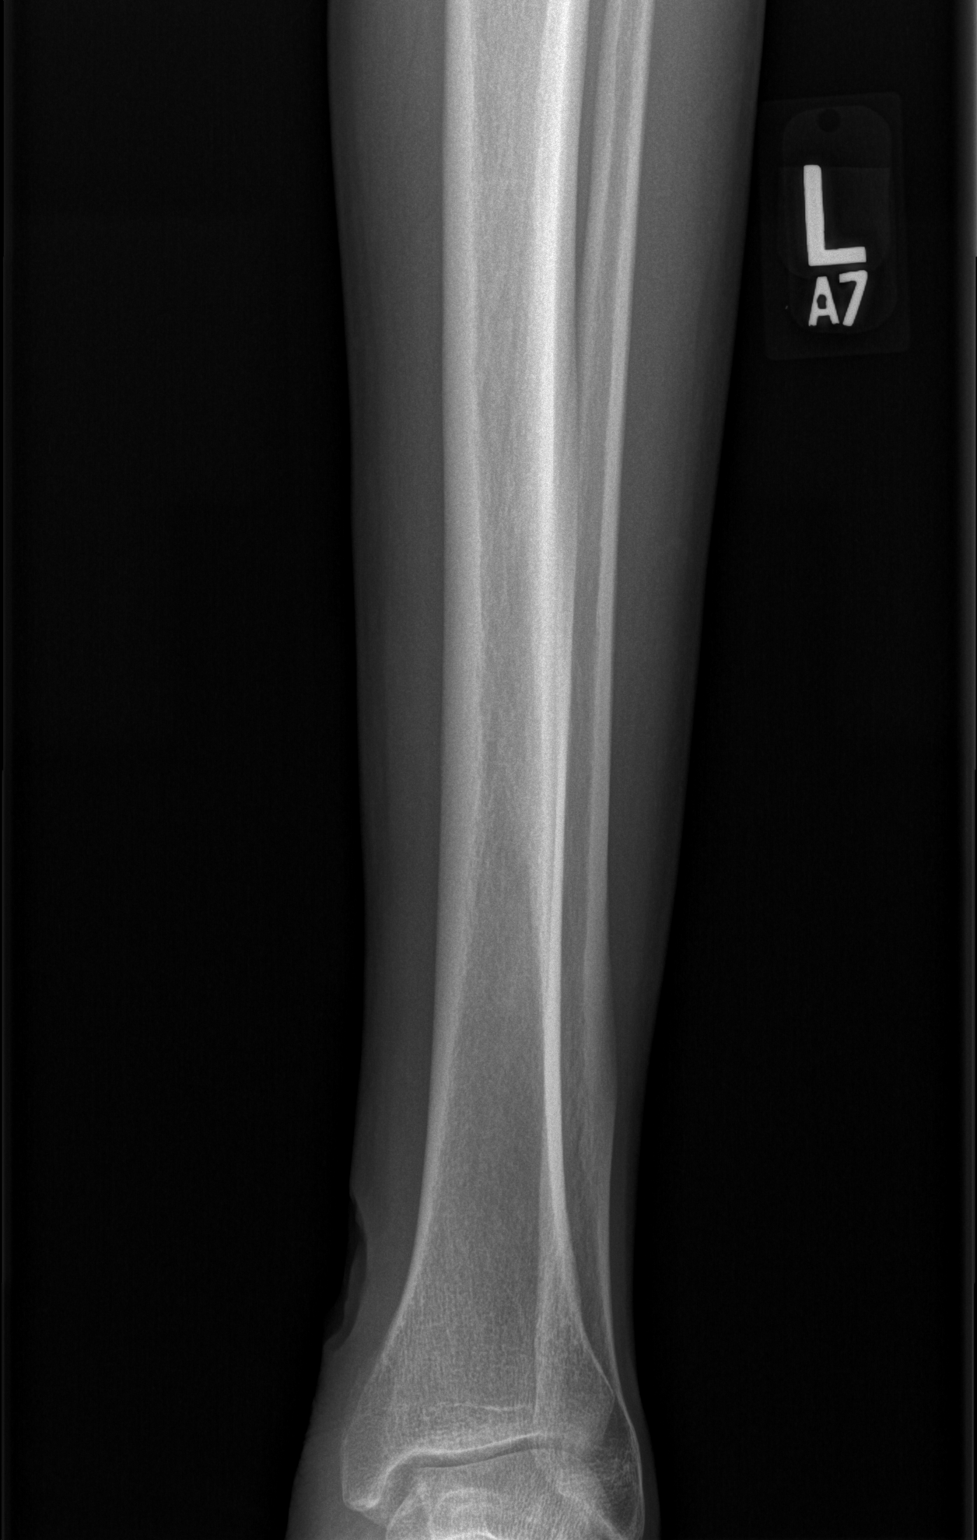

[t tib-fib lat left (1 of 2)]
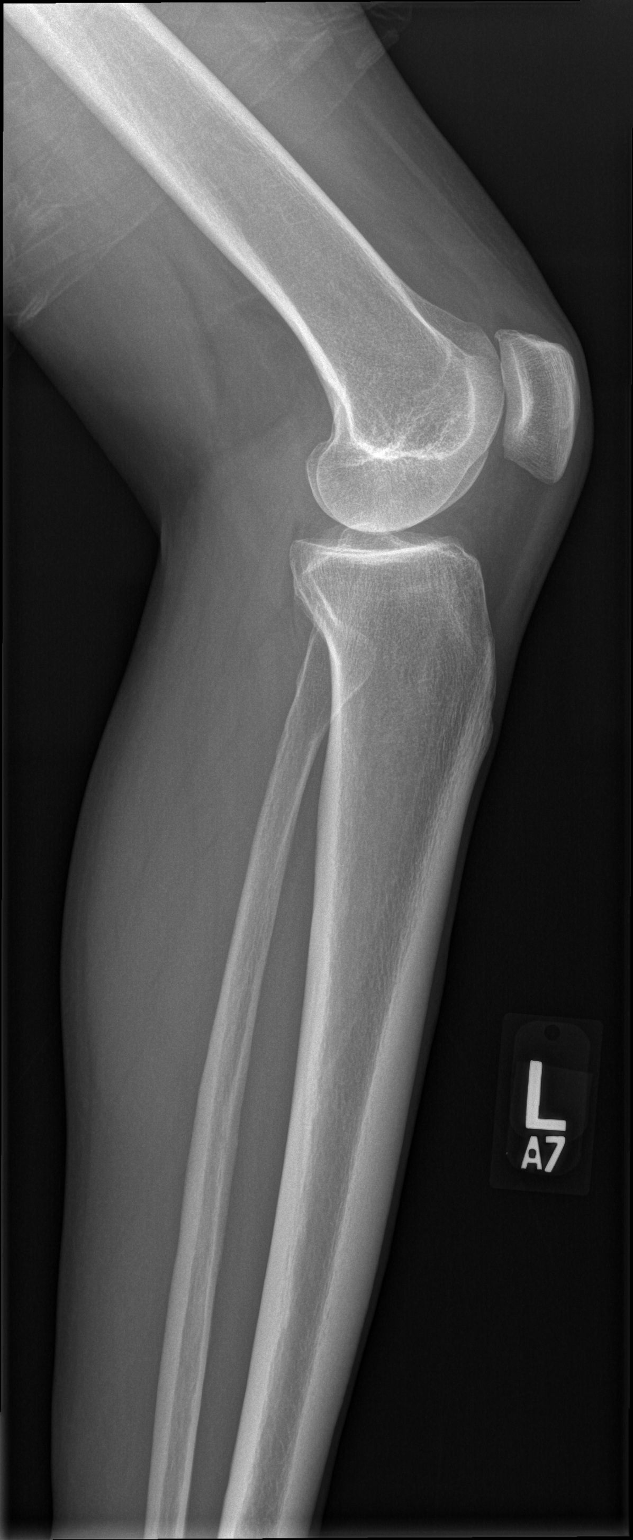

[t tib-fib lat left (2 of 2)]
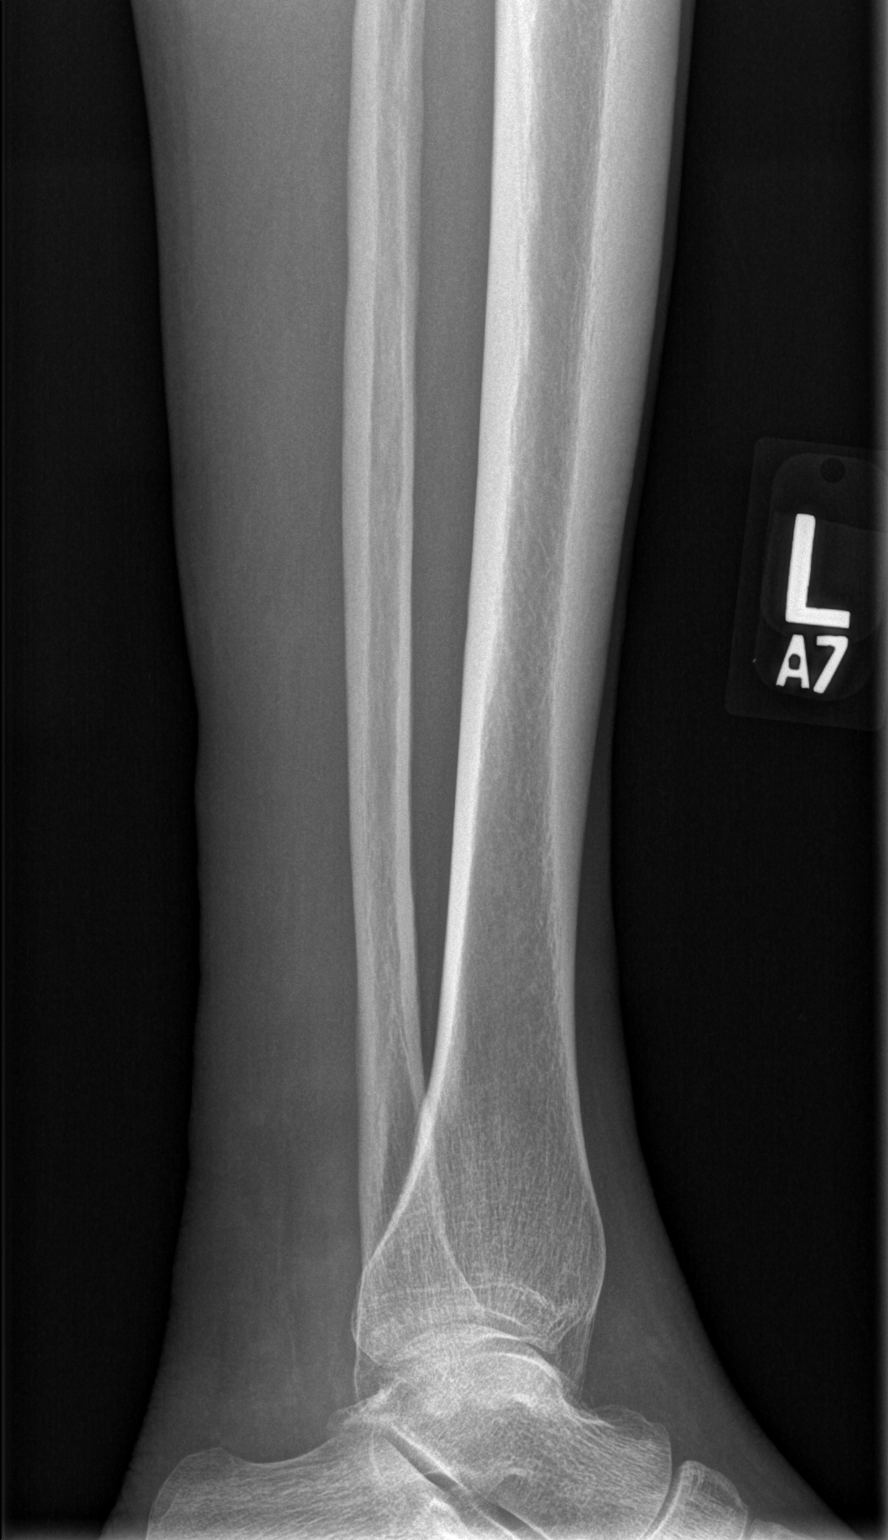

[4 of 4 positions shown; findings below may reference images not displayed]

FINDINGS: Enlarging soft tissue wound is noted along the distal aspect of the
tibia. No underlying bony destruction is seen. No other soft tissue
abnormality is noted.
IMPRESSION: Soft tissue wound enlarging in size without evidence of underlying
osteomyelitis.

## 2018-08-17 MED ORDER — GADOBUTROL 1 MMOL/ML IV SOLN
2.0000 mL | Freq: Once | INTRAVENOUS | Status: AC | PRN
  Administered 2018-08-17: 2 mL via INTRAVENOUS

## 2018-08-17 MED ORDER — POTASSIUM PHOSPHATE MONOBASIC 500 MG PO TABS
500.0000 mg | ORAL_TABLET | Freq: Three times a day (TID) | ORAL | Status: DC
  Filled 2018-08-17 (×2): qty 1

## 2018-08-17 MED ORDER — K PHOS MONO-SOD PHOS DI & MONO 155-852-130 MG PO TABS
500.0000 mg | ORAL_TABLET | Freq: Three times a day (TID) | ORAL | Status: DC
  Administered 2018-08-17 – 2018-08-18 (×3): 500 mg via ORAL
  Filled 2018-08-17 (×5): qty 2

## 2018-08-17 MED ORDER — OXYCODONE HCL 5 MG PO TABS
5.0000 mg | ORAL_TABLET | ORAL | Status: DC | PRN
  Administered 2018-08-17 – 2018-08-18 (×2): 10 mg via ORAL
  Administered 2018-08-18: 5 mg via ORAL
  Administered 2018-08-18 – 2018-08-26 (×15): 10 mg via ORAL
  Filled 2018-08-17 (×17): qty 2
  Filled 2018-08-17: qty 1
  Filled 2018-08-17 (×3): qty 2

## 2018-08-17 NOTE — Progress Notes (Signed)
Pharmacy Antibiotic Note  [name] is a 57 y.o. female admitted on 08/13/2018 with sepsis of unclear etiology. Pt has hx of bilateral AKAs with chronic stump wounds as possible source. Pharmacy has been consulted for Vancomycin/Cefepime dosing.   Day #5 of abx for sepsis of unclear etiology - ?wound from stumps. Xray suggestive of osteo? Need MRI when AMS improves. Afebrile, WBC mostly stable at 13.4.   Plan: Continue vancomycin 1000 mg IV q24h  Continue cefepime to 1g IV q24h Monitor clinical picture, renal function, VT prn F/U need for MRI? C&S, abx deescalation / LOT  Temp (24hrs), Avg:98.1 F (36.7 C), Min:97.6 F (36.4 C), Max:98.7 F (37.1 C)  Recent Labs  Lab 08/13/18 0245 08/13/18 0255 08/13/18 0357 08/13/18 0546 08/13/18 0810 08/13/18 1008 08/14/18 0401 08/15/18 0356 08/16/18 0335 08/17/18 0332  WBC 27.0*  --   --   --   --   --  17.2* 11.3* 14.0* 13.4*  CREATININE  --  1.18*  --   --   --   --  1.09* 0.84 0.88 0.69  LATICACIDVEN  --   --  4.35* 4.18* 4.3* 2.7*  --  2.1*  --   --     Estimated Creatinine Clearance: 59 mL/min (by C-G formula based on SCr of 0.69 mg/dL).    Allergies  Allergen Reactions  . Acetaminophen Swelling and Other (See Comments)    Reaction:  Eyelid swelling  . Haldol [Haloperidol Lactate]     Possible eye swelling was given together with Toradol unsure if caused the reaction  . Lisinopril     UNSPECIFIED REACTION   . Toradol [Ketorolac Tromethamine]     Eye swelling   Elenor Quinones, PharmD, BCPS Clinical Pharmacist Phone number [phone] 08/17/2018 9:23 AM

## 2018-08-17 NOTE — Progress Notes (Signed)
[facility] PROGRESS NOTE  [name] OAC:[id number] DOB: 09-Sep-1961 DOA: 08/13/2018 PCP: Azzie Glatter, FNP  Brief summary   57 y.o.femalewith a hx of Levimisole vasculitis, RA, anemia, migraine, HTN, depression, substance abuse, cocaine use who presented for B LE pain, draiange Patient has had a long history of levamisole induced vasculitis and has had AKA bilaterally and finger amputations. 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  In the ED she was found to have an elevated WBC, lactic acidosis, temperature of 100.3 and there was concern for sepsis. She last smoked crack 1 day prior, and is a daily tobacco smoker.   Assessment/Plan:  Sepsis v/s SIRS, unknown source. Elevated WBC, lactic acidosis on admission. UA not consistent with infection - admit CXR showed a small nodular density but f/u CXR 11/11 revealed only minimal atx Possible osteomyelitis BL stumps. chronic skin breakdown on her stumps, persistent purulent drainage with odor. B w/ Xrays suggestive of OM. Will obtain MRI of stumps due to recurrent symptoms, now with sepsis, already on iv antibiotics, blood cultures: NGTD. Cont pain control    Altered mental status. CT head: no acute infarcts. ammonia level is not elevated. Possible cocaine induced chronic ischemia on top of metabolic encephalopathy. - metabolic eval - avoid sedatives as able   Acute kidney injury. crt has responded well to volume resuscitation and remains normal   Acute liver failure. Elevated LFTs. Workup at the end of October revealed negative hepatitis labs and CT noted hepatic congestion -suspect alcohol induced hepatitis vs hepatic congestion from diastolic CHF v/s shock liver - LFTs now improving. Monitor   Chronic diastolic CHF. pulmonary HTN. Echo (2019): LVEF 60-65%. PA pressure 56 mmHg. +MR. no sign of volume overload. Will resume lasix AM, as needed. Not on BB due to chronic cocaine use. Resume  losartan when stable.  Monitor   Essential hypertension. Cont amlodipine for now.    Cocaine-induced vascular disorder  Protein-calorie malnutrition, severe. Due to ongoing polysubstance abuse   Anemia. Thrombocytopenia. Likely due to sepsis on top of liver fialure. Will hold lovenox.   Prognosis is guarded with ongoing cocaine use, suspect cocaine induced organ dysfunction, vasculitis, infection, sepsis, heart failure, liver failure, severe malnutrition. Will consult palliative care for [facility]    Code Status: full Family Communication: d/w patient, RN (indicate person spoken with, relationship, and if by phone, the number) Disposition Plan: SDU   Consultants:  none  Procedures:  none  Antibiotics: Anti-infectives (From admission, onward)   Start     Dose/Rate Route Frequency Ordered Stop   08/15/18 1000  ceFEPIme (MAXIPIME) 1 g in sodium chloride 0.9 % 100 mL IVPB     1 g 200 mL/hr over 30 Minutes Intravenous Every 24 hours 08/14/18 1020     08/14/18 0600  vancomycin (VANCOCIN) IVPB 1000 mg/200 mL premix     1,000 mg 200 mL/hr over 60 Minutes Intravenous Every 24 hours 08/13/18 0646     08/13/18 2200  ceFEPIme (MAXIPIME) 1 g in sodium chloride 0.9 % 100 mL IVPB  Status:  Discontinued     1 g 200 mL/hr over 30 Minutes Intravenous Every 12 hours 08/13/18 0646 08/14/18 1020   08/13/18 0415  ceFEPIme (MAXIPIME) 2 g in sodium chloride 0.9 % 100 mL IVPB     2 g 200 mL/hr over 30 Minutes Intravenous  Once 08/13/18 0414 08/13/18 0451   08/13/18 0415  metroNIDAZOLE (FLAGYL) IVPB 500 mg  Status:  Discontinued  500 mg 100 mL/hr over 60 Minutes Intravenous Every 8 hours 08/13/18 0414 08/13/18 0734   08/13/18 0415  vancomycin (VANCOCIN) IVPB 1000 mg/200 mL premix     1,000 mg 200 mL/hr over 60 Minutes Intravenous  Once 08/13/18 0414 08/13/18 0648        (indicate start date, and stop date if known)  HPI/Subjective: Confused, no focal neuro symptoms. Afebrile. Reports bl  stump pain, drainage. Non resolving. Now with sepsis. Reported history of cocaine use/injections into her legs.   Objective: Vitals:   08/17/18 0359 08/17/18 0813  BP: (!) 142/82 (!) 132/98  Pulse: 87 87  Resp: 20 (!) 28  Temp: 98.3 F (36.8 C) 97.6 F (36.4 C)  SpO2: 95% 92%    Intake/Output Summary (Last 24 hours) at 08/17/2018 1055 Last data filed at 08/17/2018 0400 Gross per 24 hour  Intake 650 ml  Output 550 ml  Net 100 ml   There were no vitals filed for this visit.  Exam:   General:  Looks chronically ill   Cardiovascular: s1,s2 rrr  Respiratory: diminished in LL  Abdomen: soft, nt   Musculoskeletal: BL AKA. Stump with purulent drainage    Data Reviewed: Basic Metabolic Panel: Recent Labs  Lab 08/13/18 0255 08/14/18 0401 08/15/18 0356 08/16/18 0335 08/17/18 0332  NA 133* 134* 135 134* 134*  K 5.2* 4.4 4.1 4.4 4.3  CL 103 109 111 110 110  CO2 14* 17* 18* 16* 17*  GLUCOSE 58* 140* 131* 80 92  BUN 39* 40* 30* 27* 21*  CREATININE 1.18* 1.09* 0.84 0.88 0.69  CALCIUM 8.8* 8.2* 8.1* 8.1* 8.1*  MG  --   --   --  2.0 1.8  PHOS  --   --   --  1.8* 1.5*   Liver Function Tests: Recent Labs  Lab 08/13/18 0255 08/14/18 0401 08/15/18 0356 08/16/18 0335 08/17/18 0332  AST 393* 927* 941* 616* 287*  ALT 187* 496* 670* 610* 457*  ALKPHOS 101 98 103 103 98  BILITOT 5.2* 4.3* 4.0* 4.3* 4.0*  PROT 6.5 5.8* 5.8* 6.1* 6.0*  ALBUMIN 3.1* 2.8* 2.6* 2.6* 2.5*   Recent Labs  Lab 08/11/18 0101  LIPASE 25   Recent Labs  Lab 08/16/18 1655 08/17/18 0332  AMMONIA 32 30   CBC: Recent Labs  Lab 08/11/18 0101 08/13/18 0245 08/14/18 0401 08/15/18 0356 08/16/18 0335 08/17/18 0332  WBC 10.7* 27.0* 17.2* 11.3* 14.0* 13.4*  NEUTROABS 7.6 23.7*  --   --   --   --   HGB 10.2* 9.8* 9.8* 9.8* 10.1* 10.4*  HCT 32.3* 33.2* 32.2* 32.9* 34.5* 34.5*  MCV 81.8 86.7 85.9 85.7 86.5 85.6  PLT 335 293 220 154 92* 71*   Cardiac Enzymes: No results for input(s):  CKTOTAL, CKMB, CKMBINDEX, TROPONINI in the last 168 hours. BNP (last 3 results) Recent Labs    05/19/18 2116 06/20/18 2239  BNP 3,603.1* 3,612.1*    ProBNP (last 3 results) No results for input(s): PROBNP in the last 8760 hours.  CBG: Recent Labs  Lab 08/13/18 0814 08/14/18 0756 08/14/18 1129  GLUCAP 80 111* 199*    Recent Results (from the past 240 hour(s))  Blood culture (routine x 2)     Status: None (Preliminary result)   Collection Time: 08/13/18  3:35 AM  Result Value Ref Range Status   Specimen Description SITE NOT SPECIFIED  Final   Special Requests   Final    BOTTLES DRAWN AEROBIC ONLY Blood Culture adequate volume  Culture   Final    NO GROWTH 4 DAYS Performed at [facility], [place] [address] [place], [place] [address]    Report Status PENDING  Incomplete  Blood culture (routine x 2)     Status: None (Preliminary result)   Collection Time: 08/13/18  4:15 AM  Result Value Ref Range Status   Specimen Description BLOOD RIGHT ELBOW  Final   Special Requests   Final    BOTTLES DRAWN AEROBIC AND ANAEROBIC Blood Culture adequate volume   Culture   Final    NO GROWTH 4 DAYS Performed at [facility], [place] [address], [place], [place] [address]    Report Status PENDING  Incomplete     Studies: Ct Head Wo Contrast  Result Date: 08/16/2018 CLINICAL DATA:  Altered level of consciousness. History of migraines, hypertension and cocaine abuse. EXAM: CT HEAD WITHOUT CONTRAST TECHNIQUE: Contiguous axial images were obtained from the base of the skull through the vertex without intravenous contrast. COMPARISON:  CT HEAD Feb 16, 2018 FINDINGS: BRAIN: No intraparenchymal hemorrhage, mass effect nor midline shift. Borderline parenchymal brain volume loss for age. No acute large vascular territory infarcts. No abnormal extra-axial fluid collections. Basal cisterns are patent. VASCULAR: Moderate calcific atherosclerosis carotid siphon. SKULL/SOFT TISSUES: No skull  fracture. No significant soft tissue swelling. ORBITS/SINUSES: The included ocular globes and orbital contents are normal.Trace paranasal sinus mucosal thickening. Mastoid air cells are well aerated. OTHER: Patient is edentulous. IMPRESSION: 1. No acute intracranial process. 2. Borderline parenchymal brain volume loss for age. 3. Advanced moderate intracranial atherosclerosis. Electronically Signed   By: Elon Alas M.D.   On: 08/16/2018 22:28   US Abdomen Complete  Result Date: 08/17/2018 CLINICAL DATA:  Transaminitis EXAM: ABDOMEN ULTRASOUND COMPLETE COMPARISON:  Abdominal ultrasound of July 30 2018 FINDINGS: Gallbladder: The gallbladder is adequately distended. Echogenic bile or sludge is present. There no discrete stones. There is no gallbladder wall thickening or positive sonographic [name]'s sign. A small amount of ascites is noted adjacent to the gallbladder. Common bile duct: Diameter: 4 mm Liver: The hepatic echotexture is heterogeneously increased. There is no discrete mass or ductal dilation. A small amount of ascites surrounds the liver. Portal vein is patent on color Doppler imaging with bidirectional flow. IVC: No abnormality visualized. Pancreas: The pancreatic parenchyma is grossly normal. The duct is dilated to 4 mm. No discrete pancreatic masses are observed. Spleen: The spleen is normal in size and echotexture. Right Kidney: Length: 11.2 cm. Echogenicity within normal limits. No mass or hydronephrosis visualized. Left Kidney: Length: 9.9 cm. Echogenicity within normal limits. No mass or hydronephrosis visualized. Abdominal aorta: There is no aortic aneurysm. There is mural plaque. Other findings: There is ascites as well as bilateral pleural effusions. IMPRESSION: Increased hepatic echotexture without discrete mass. By directional flow in the portal vein consistent with portal hypertension. Mild dilation of the pancreatic duct 4 mm without evidence of pancreatic masses. Gallbladder  sludge without discrete stones or sonographic evidence of acute cholecystitis. Electronically Signed   By: David  Martinique M.D.   On: 08/17/2018 09:06    Scheduled Meds: . amLODipine  10 mg Oral Daily  . enoxaparin (LOVENOX) injection  40 mg Subcutaneous Daily   Continuous Infusions: . sodium chloride 50 mL/hr at 08/17/18 0453  . ceFEPime (MAXIPIME) IV 1 g (08/17/18 1024)  . vancomycin 1,000 mg (08/17/18 0617)    Active Problems:   Essential hypertension   Cocaine-induced vascular disorder ([facility])   Protein-calorie malnutrition, severe ([place])  Acute kidney injury ([place])   Tobacco abuse   S/P bilateral below knee amputation ([place])   Ulcer of amputation stump of lower extremity ([facility])   Sepsis ([place])   Elevated LFTs    Time spent: >35 minutes     Kinnie Feil  [facility] Pager [phone]. If 7PM-7AM, please contact night-coverage at [email or web address], password [email or web address] 08/17/2018, 10:55 AM  LOS: 4 days

## 2018-08-18 DIAGNOSIS — Z7189 Other specified counseling: Secondary | ICD-10-CM

## 2018-08-18 DIAGNOSIS — Z515 Encounter for palliative care: Secondary | ICD-10-CM

## 2018-08-18 LAB — CULTURE, BLOOD (ROUTINE X 2)
CULTURE: NO GROWTH
Culture: NO GROWTH
Special Requests: ADEQUATE
Special Requests: ADEQUATE

## 2018-08-18 LAB — COMPREHENSIVE METABOLIC PANEL
ALK PHOS: 95 U/L (ref 38–126)
ALT: 320 U/L — AB (ref 0–44)
AST: 137 U/L — AB (ref 15–41)
Albumin: 2.3 g/dL — ABNORMAL LOW (ref 3.5–5.0)
Anion gap: 10 (ref 5–15)
BILIRUBIN TOTAL: 3.7 mg/dL — AB (ref 0.3–1.2)
BUN: 15 mg/dL (ref 6–20)
CALCIUM: 8 mg/dL — AB (ref 8.9–10.3)
CO2: 17 mmol/L — ABNORMAL LOW (ref 22–32)
CREATININE: 0.72 mg/dL (ref 0.44–1.00)
Chloride: 109 mmol/L (ref 98–111)
GFR calc Af Amer: >60 mL/min (ref >60)
GLUCOSE: 95 mg/dL (ref 70–99)
POTASSIUM: 3.8 mmol/L (ref 3.5–5.1)
Sodium: 136 mmol/L (ref 135–145)
TOTAL PROTEIN: 5.8 g/dL — AB (ref 6.5–8.1)

## 2018-08-18 LAB — CBC
HEMATOCRIT: 31.7 % — AB (ref 36.0–46.0)
Hemoglobin: 9.2 g/dL — ABNORMAL LOW (ref 12.0–15.0)
MCH: 25.1 pg — ABNORMAL LOW (ref 26.0–34.0)
MCHC: 29 g/dL — ABNORMAL LOW (ref 30.0–36.0)
MCV: 86.4 fL (ref 80.0–100.0)
NRBC: 0.1 % (ref 0.0–0.2)
Platelets: 106 10*3/uL — ABNORMAL LOW (ref 150–400)
RBC: 3.67 MIL/uL — ABNORMAL LOW (ref 3.87–5.11)
RDW: 20.9 % — ABNORMAL HIGH (ref 11.5–15.5)
WBC: 15 10*3/uL — ABNORMAL HIGH (ref 4.0–10.5)

## 2018-08-18 LAB — PHOSPHORUS: PHOSPHORUS: 2.8 mg/dL (ref 2.5–4.6)

## 2018-08-18 MED ORDER — ENSURE ENLIVE PO LIQD
237.0000 mL | Freq: Two times a day (BID) | ORAL | Status: DC
  Administered 2018-08-19 – 2018-08-26 (×9): 237 mL via ORAL

## 2018-08-18 MED ORDER — FUROSEMIDE 10 MG/ML IJ SOLN
20.0000 mg | Freq: Every day | INTRAMUSCULAR | Status: DC
  Administered 2018-08-18 – 2018-08-20 (×3): 20 mg via INTRAVENOUS
  Filled 2018-08-18 (×3): qty 2

## 2018-08-18 NOTE — Progress Notes (Addendum)
Initial Nutrition Assessment  DOCUMENTATION CODES:   Non-severe (moderate) malnutrition in context of social or environmental circumstances  INTERVENTION:    Ensure Enlive po BID, each supplement provides 350 kcal and 20 grams of protein  NUTRITION DIAGNOSIS:   Moderate Malnutrition related to social / environmental circumstances(limited access to food) as evidenced by energy intake < or equal to 75% for > or equal to 1 month, moderate fat depletion, mild muscle depletion  GOAL:   Patient will meet greater than or equal to 90% of their needs  MONITOR:   PO intake, Supplement acceptance, Labs, Skin, Weight trends, I & O's  REASON FOR ASSESSMENT:   Low Braden  ASSESSMENT:   57 y.o. Female with a hx of Levimisole vasculitis, RA, anemia, migraine, HTN, depression, substance abuse, cocaine use who presented for B LE pain, draiange.  RD briefly spoke with patient at bedside. She reports she ate well at lunch today (pot roast). Only consumes 1 meal per day PTA (was living with her brother).  In middle of interview with RD pt became tearful. Stated "I think I left my wheelchair at [employer] house". Low braden score places pt at risk for further skin breakdown.  Palliative Medicine consulted for [place]. Labs & medications reviewed. Malnutrition is ongoing.  NUTRITION - FOCUSED PHYSICAL EXAM:  Deferred at this time. Pt very emotional.   NFPE was completed with nutrition assessment 07/13/2018. Findings were moderate fat depletion and mild muslce depletion.  Diet Order:   Diet Order            Diet regular Room service appropriate? Yes; Fluid consistency: Thin  Diet effective now             EDUCATION NEEDS:   Not appropriate for education at this time  Skin:  Skin Assessment: Skin Integrity Issues: Skin Integrity Issues:: Other (Comment) Other: full thickness wounds over previous surgical sites to AKA stumps  Last BM:  PTA  Height:   Ht Readings from Last 1  Encounters:  08/18/18 5' 1"  (1.549 m)   Weight:   Wt Readings from Last 1 Encounters:  08/18/18 47 kg   BMI:  24.8 kg/m2 (adjusted for bilateral AKA's)  Estimated Nutritional Needs:   Kcal:  1400-1600  Protein:  60-75 gm  Fluid:  >/= 1.5 L  Arthur Holms, RD, LDN Pager #: [phone] After-Hours Pager #: [phone]

## 2018-08-18 NOTE — Progress Notes (Signed)
[facility] PROGRESS NOTE  [name] YYT:[id number] DOB: Sep 12, 1961 DOA: 08/13/2018 PCP: Azzie Glatter, FNP  Brief summary   57 y.o.femalewith a hx of Levimisole vasculitis, RA, anemia, migraine, HTN, depression, substance abuse, cocaine use who presented for B LE pain, draiange Patient has had a long history of levamisole induced vasculitis and has had AKA bilaterally and finger amputations. 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  In the ED she was found to have an elevated WBC, lactic acidosis, temperature of 100.3 and there was concern for sepsis. She last smoked crack 1 day prior, and is a daily tobacco smoker.   Assessment/Plan:  Sepsis v/s SIRS, unknown source. Likely due to BL AKA/stump wounds. Elevated WBC, lactic acidosis on admission. UA not consistent with infection - admit CXR showed a small nodular density but f/u CXR 11/11 revealed only minimal atx -Chronic skin breakdown on her stumps, persistent purulent drainage with odor. now with sepsis. MRI BL LEstumps: no clear osteomyelitis.  Cont iv antibiotics, blood cultures: NGTD. Cont pain control    Altered mental status. CT head: no acute infarcts. ammonia level is not elevated. Possible cocaine induced chronic ischemia on top of metabolic encephalopathy. - metabolic eval - avoid sedatives as able   Acute kidney injury. crt has responded well to volume resuscitation and remains normal   Acute liver failure. Elevated LFTs. Workup at the end of October revealed negative hepatitis labs and CT noted hepatic congestion -suspect alcohol induced hepatitis vs hepatic congestion from diastolic CHF v/s shock liver - LFTs now improving. Monitor   Chronic diastolic CHF. pulmonary HTN. Echo (2019): LVEF 60-65%. PA pressure 56 mmHg. +MR. Resumed lasix AM, as needed. Not on BB due to chronic cocaine use. Resume losartan when stable.  Monitor   Essential hypertension. Cont amlodipine for  now.    Cocaine-induced vascular disorder  Protein-calorie malnutrition, severe. Due to ongoing polysubstance abuse   Anemia. Thrombocytopenia. Likely due to sepsis on top of liver fialure. Will hold lovenox.   Prognosis is guarded with ongoing cocaine use, suspect cocaine induced organ dysfunction, vasculitis, infection, sepsis, heart failure, liver failure, severe malnutrition. Will consult palliative care for [place]    Code Status: full Family Communication: d/w patient, RN (indicate person spoken with, relationship, and if by phone, the number) Disposition Plan: SDU   Consultants:  none  Procedures:  none  Antibiotics: Anti-infectives (From admission, onward)   Start     Dose/Rate Route Frequency Ordered Stop   08/15/18 1000  ceFEPIme (MAXIPIME) 1 g in sodium chloride 0.9 % 100 mL IVPB     1 g 200 mL/hr over 30 Minutes Intravenous Every 24 hours 08/14/18 1020     08/14/18 0600  vancomycin (VANCOCIN) IVPB 1000 mg/200 mL premix     1,000 mg 200 mL/hr over 60 Minutes Intravenous Every 24 hours 08/13/18 0646     08/13/18 2200  ceFEPIme (MAXIPIME) 1 g in sodium chloride 0.9 % 100 mL IVPB  Status:  Discontinued     1 g 200 mL/hr over 30 Minutes Intravenous Every 12 hours 08/13/18 0646 08/14/18 1020   08/13/18 0415  ceFEPIme (MAXIPIME) 2 g in sodium chloride 0.9 % 100 mL IVPB     2 g 200 mL/hr over 30 Minutes Intravenous  Once 08/13/18 0414 08/13/18 0451   08/13/18 0415  metroNIDAZOLE (FLAGYL) IVPB 500 mg  Status:  Discontinued     500 mg 100 mL/hr over 60 Minutes Intravenous Every 8 hours  08/13/18 0414 08/13/18 0734   08/13/18 0415  vancomycin (VANCOCIN) IVPB 1000 mg/200 mL premix     1,000 mg 200 mL/hr over 60 Minutes Intravenous  Once 08/13/18 0414 08/13/18 0648       (indicate start date, and stop date if known)  HPI/Subjective: Confused, no focal neuro symptoms. Afebrile. Reports bl stump pain, drainage. Non resolving. Now with sepsis. Reported history of cocaine  use/injections into her legs.   Objective: Vitals:   08/18/18 0525 08/18/18 1001  BP: 124/78 (!) 126/105  Pulse:    Resp:    Temp: 97.9 F (36.6 C)   SpO2: 92%    No intake or output data in the 24 hours ending 08/18/18 1108 There were no vitals filed for this visit.  Exam:   General:  Looks chronically ill   Cardiovascular: s1,s2 rrr  Respiratory: diminished in LL  Abdomen: soft, nt   Musculoskeletal: BL AKA. Stump with purulent drainage    Data Reviewed: Basic Metabolic Panel: Recent Labs  Lab 08/14/18 0401 08/15/18 0356 08/16/18 0335 08/17/18 0332 08/18/18 0416  NA 134* 135 134* 134* 136  K 4.4 4.1 4.4 4.3 3.8  CL 109 111 110 110 109  CO2 17* 18* 16* 17* 17*  GLUCOSE 140* 131* 80 92 95  BUN 40* 30* 27* 21* 15  CREATININE 1.09* 0.84 0.88 0.69 0.72  CALCIUM 8.2* 8.1* 8.1* 8.1* 8.0*  MG  --   --  2.0 1.8  --   PHOS  --   --  1.8* 1.5* 2.8   Liver Function Tests: Recent Labs  Lab 08/14/18 0401 08/15/18 0356 08/16/18 0335 08/17/18 0332 08/18/18 0416  AST 927* 941* 616* 287* 137*  ALT 496* 670* 610* 457* 320*  ALKPHOS 98 103 103 98 95  BILITOT 4.3* 4.0* 4.3* 4.0* 3.7*  PROT 5.8* 5.8* 6.1* 6.0* 5.8*  ALBUMIN 2.8* 2.6* 2.6* 2.5* 2.3*   No results for input(s): LIPASE, AMYLASE in the last 168 hours. Recent Labs  Lab 08/16/18 1655 08/17/18 0332  AMMONIA 32 30   CBC: Recent Labs  Lab 08/13/18 0245 08/14/18 0401 08/15/18 0356 08/16/18 0335 08/17/18 0332 08/18/18 0416  WBC 27.0* 17.2* 11.3* 14.0* 13.4* 15.0*  NEUTROABS 23.7*  --   --   --   --   --   HGB 9.8* 9.8* 9.8* 10.1* 10.4* 9.2*  HCT 33.2* 32.2* 32.9* 34.5* 34.5* 31.7*  MCV 86.7 85.9 85.7 86.5 85.6 86.4  PLT 293 220 154 92* 71* 106*   Cardiac Enzymes: No results for input(s): CKTOTAL, CKMB, CKMBINDEX, TROPONINI in the last 168 hours. BNP (last 3 results) Recent Labs    05/19/18 2116 06/20/18 2239  BNP 3,603.1* 3,612.1*    ProBNP (last 3 results) No results for input(s):  PROBNP in the last 8760 hours.  CBG: Recent Labs  Lab 08/13/18 0814 08/14/18 0756 08/14/18 1129  GLUCAP 80 111* 199*    Recent Results (from the past 240 hour(s))  Blood culture (routine x 2)     Status: None   Collection Time: 08/13/18  3:35 AM  Result Value Ref Range Status   Specimen Description BLOOD SITE NOT SPECIFIED  Final   Special Requests   Final    BOTTLES DRAWN AEROBIC ONLY Blood Culture adequate volume   Culture   Final    NO GROWTH 5 DAYS Performed at [facility], [address] [place], [place] [address]    Report Status 08/18/2018 FINAL  Final  Blood culture (routine  x 2)     Status: None   Collection Time: 08/13/18  4:15 AM  Result Value Ref Range Status   Specimen Description BLOOD RIGHT ELBOW  Final   Special Requests   Final    BOTTLES DRAWN AEROBIC AND ANAEROBIC Blood Culture adequate volume   Culture   Final    NO GROWTH 5 DAYS Performed at [facility], [address], [place], [place] [address]    Report Status 08/18/2018 FINAL  Final     Studies: Ct Head Wo Contrast  Result Date: 08/16/2018 CLINICAL DATA:  Altered level of consciousness. History of migraines, hypertension and cocaine abuse. EXAM: CT HEAD WITHOUT CONTRAST TECHNIQUE: Contiguous axial images were obtained from the base of the skull through the vertex without intravenous contrast. COMPARISON:  CT HEAD Feb 16, 2018 FINDINGS: BRAIN: No intraparenchymal hemorrhage, mass effect nor midline shift. Borderline parenchymal brain volume loss for age. No acute large vascular territory infarcts. No abnormal extra-axial fluid collections. Basal cisterns are patent. VASCULAR: Moderate calcific atherosclerosis carotid siphon. SKULL/SOFT TISSUES: No skull fracture. No significant soft tissue swelling. ORBITS/SINUSES: The included ocular globes and orbital contents are normal.Trace paranasal sinus mucosal thickening. Mastoid air cells are well aerated. OTHER: Patient is edentulous.  IMPRESSION: 1. No acute intracranial process. 2. Borderline parenchymal brain volume loss for age. 3. Advanced moderate intracranial atherosclerosis. Electronically Signed   By: Elon Alas M.D.   On: 08/16/2018 22:28   US Abdomen Complete  Result Date: 08/17/2018 CLINICAL DATA:  Transaminitis EXAM: ABDOMEN ULTRASOUND COMPLETE COMPARISON:  Abdominal ultrasound of July 30 2018 FINDINGS: Gallbladder: The gallbladder is adequately distended. Echogenic bile or sludge is present. There no discrete stones. There is no gallbladder wall thickening or positive sonographic [name]'s sign. A small amount of ascites is noted adjacent to the gallbladder. Common bile duct: Diameter: 4 mm Liver: The hepatic echotexture is heterogeneously increased. There is no discrete mass or ductal dilation. A small amount of ascites surrounds the liver. Portal vein is patent on color Doppler imaging with bidirectional flow. IVC: No abnormality visualized. Pancreas: The pancreatic parenchyma is grossly normal. The duct is dilated to 4 mm. No discrete pancreatic masses are observed. Spleen: The spleen is normal in size and echotexture. Right Kidney: Length: 11.2 cm. Echogenicity within normal limits. No mass or hydronephrosis visualized. Left Kidney: Length: 9.9 cm. Echogenicity within normal limits. No mass or hydronephrosis visualized. Abdominal aorta: There is no aortic aneurysm. There is mural plaque. Other findings: There is ascites as well as bilateral pleural effusions. IMPRESSION: Increased hepatic echotexture without discrete mass. By directional flow in the portal vein consistent with portal hypertension. Mild dilation of the pancreatic duct 4 mm without evidence of pancreatic masses. Gallbladder sludge without discrete stones or sonographic evidence of acute cholecystitis. Electronically Signed   By: David  Martinique M.D.   On: 08/17/2018 09:06   Mr Femur Right W Wo Contrast  Result Date: 08/18/2018 CLINICAL DATA:  57  y.o. female with medical history significant of Levamisole vasculitis, RA, anemia, migraine, HTN, depression, cocaine use who presents for bilateral LE pain. Bilateral AKA. Chronic pain for wounds of the lower extremity at the stumps. EXAM: MR OF THE LEFT LOWER EXTREMITY WITHOUT AND WITH CONTRAST MRI OF THE RIGHT FEMUR WITHOUT AND WITH CONTRAST TECHNIQUE: Multiplanar, multisequence MR imaging of the right lower extremity was performed both before and after administration of intravenous contrast. Multiplanar, multisequence MR imaging of the left lower extremity was performed both before and after administration of  intravenous contrast. CONTRAST:  2 mL Gadavist COMPARISON:  None. FINDINGS: Bones/Joint/Cartilage Bilateral above the knee amputation through the distal femoral diaphysis. No periosteal reaction or bone destruction. No aggressive osseous lesion. No T1 signal abnormality in bilateral femurs. No acute fracture or dislocation. Mild osteoarthritis of bilateral hips. Small bilateral hip joint effusions. Normal alignment. Ligaments, Muscles and Tendons Mild diffuse T2 hyperintense signal throughout the pelvic and femoral musculature without significant enhancement likely neurogenic. No intramuscular drainable fluid collection or hematoma. Soft tissue Diffuse soft tissue edema throughout the subcutaneous fat of the pelvis and bilateral femurs which may be secondary to anasarca or fluid overload versus severe cellulitis. T2 hyperintense well-circumscribed fluid collection adjacent to the pectineus muscle bilaterally without enhancement unchanged compared with prior CT abdomen dated 06/21/2018. No other drainable fluid collection. Moderate pelvic free fluid. IMPRESSION: 1. Bilateral above the knee amputation through the distal femoral diaphysis without evidence of osteomyelitis. 2. Diffuse soft tissue edema throughout the subcutaneous fat of the pelvis and bilateral femurs which may be secondary to anasarca. No  drainable fluid collection to suggest an abscess. 3. Mild osteoarthritis of bilateral hips with bilateral hip joint effusions. Electronically Signed   By: Kathreen Devoid   On: 08/18/2018 08:34   Mr Femur Left W Wo Contrast  Result Date: 08/18/2018 CLINICAL DATA:  57 y.o. female with medical history significant of Levamisole vasculitis, RA, anemia, migraine, HTN, depression, cocaine use who presents for bilateral LE pain. Bilateral AKA. Chronic pain for wounds of the lower extremity at the stumps. EXAM: MR OF THE LEFT LOWER EXTREMITY WITHOUT AND WITH CONTRAST MRI OF THE RIGHT FEMUR WITHOUT AND WITH CONTRAST TECHNIQUE: Multiplanar, multisequence MR imaging of the right lower extremity was performed both before and after administration of intravenous contrast. Multiplanar, multisequence MR imaging of the left lower extremity was performed both before and after administration of intravenous contrast. CONTRAST:  2 mL Gadavist COMPARISON:  None. FINDINGS: Bones/Joint/Cartilage Bilateral above the knee amputation through the distal femoral diaphysis. No periosteal reaction or bone destruction. No aggressive osseous lesion. No T1 signal abnormality in bilateral femurs. No acute fracture or dislocation. Mild osteoarthritis of bilateral hips. Small bilateral hip joint effusions. Normal alignment. Ligaments, Muscles and Tendons Mild diffuse T2 hyperintense signal throughout the pelvic and femoral musculature without significant enhancement likely neurogenic. No intramuscular drainable fluid collection or hematoma. Soft tissue Diffuse soft tissue edema throughout the subcutaneous fat of the pelvis and bilateral femurs which may be secondary to anasarca or fluid overload versus severe cellulitis. T2 hyperintense well-circumscribed fluid collection adjacent to the pectineus muscle bilaterally without enhancement unchanged compared with prior CT abdomen dated 06/21/2018. No other drainable fluid collection. Moderate pelvic free  fluid. IMPRESSION: 1. Bilateral above the knee amputation through the distal femoral diaphysis without evidence of osteomyelitis. 2. Diffuse soft tissue edema throughout the subcutaneous fat of the pelvis and bilateral femurs which may be secondary to anasarca. No drainable fluid collection to suggest an abscess. 3. Mild osteoarthritis of bilateral hips with bilateral hip joint effusions. Electronically Signed   By: Kathreen Devoid   On: 08/18/2018 08:34    Scheduled Meds: . amLODipine  10 mg Oral Daily  . phosphorus  500 mg Oral TID WC & HS   Continuous Infusions: . ceFEPime (MAXIPIME) IV 1 g (08/18/18 1000)  . vancomycin 1,000 mg (08/18/18 0521)    Active Problems:   Essential hypertension   Cocaine-induced vascular disorder ([place])   Protein-calorie malnutrition, severe ([place])   Acute kidney injury ([place])  Tobacco abuse   S/P bilateral below knee amputation ([place])   Ulcer of amputation stump of lower extremity ([facility])   Sepsis ([place])   Elevated LFTs    Time spent: >35 minutes     Kinnie Feil  [facility] Pager [phone]. If 7PM-7AM, please contact night-coverage at [email or web address], password [facility] 08/18/2018, 11:08 AM  LOS: 5 days

## 2018-08-18 NOTE — Progress Notes (Signed)
Palliative: Ms. Lubrano is resting quietly in bed.  She will briefly make but not keep eye contact.  She is subdued, but alert and oriented.  There is no family at bedside at this time.  We talked about her chronic health conditions, in particular her infection and bilateral AKA.  I ask if she needed it, would she accept further surgery.  She states that she does not believe that that would help her, and I agree.  I asked her what would happen if we were not able to knock out the infection.  She shares that she does not know.  I share that if she has infection, particularly in the bone, and we are not able to get rid of the infection, it would mean that she would pass away sooner rather than later.  She seems to accept this.  We talked about her living situation.  She tells me that at this point she is going to discharge to a nursing home.  I encouraged her to work with social work.  It may be very difficult to find placement for her due to history of substance abuse.    We talked about CODE STATUS, treat the treatable care.  I encouraged Ms. Boroff to consider whether she would want life support.  We talked about healthcare power of attorney.  I share that at this point her brother Ailene Ards would be her surrogate decision-maker.  She nods her head affirmatively.  I asked her if she would like for me to call [name], give him an update, possibly open communication.  At this point she begins to cry, telling me that she does not want to talk to [facility].  I asked about other family support, are her parents still living.  She tells me that she has not spoken to her parents in years.  Emotional and psychological support provided.  Conference with nursing staff related to goals of care discussion, patient needs, disposition.  85 minutes Quinn Axe, NP Palliative medicine team [phone]

## 2018-08-19 MED ORDER — POLYETHYLENE GLYCOL 3350 17 G PO PACK
17.0000 g | PACK | Freq: Every day | ORAL | Status: DC
  Administered 2018-08-19 – 2018-08-26 (×8): 17 g via ORAL
  Filled 2018-08-19 (×8): qty 1

## 2018-08-19 NOTE — Consult Note (Signed)
WOC consulted for stump wounds, please see consultation note on 08/15/18. Wound care orders entered with recommendations for follow up or consultation with Dr. Sharol Given who performed her amputations.  will not follow at this time. Thanks  Nnenna Meador [employer], RN,CWOCN, CNS, [place] [phone])

## 2018-08-19 NOTE — Progress Notes (Signed)
[facility] PROGRESS NOTE  [name] VCB:[id number] DOB: Jun 17, 1961 DOA: 08/13/2018 PCP: Azzie Glatter, FNP  Brief summary   57 y.o.femalewith a hx of Levimisole vasculitis, RA, anemia, migraine, HTN, depression, substance abuse, cocaine use who presented for B LE pain, draiange Patient has had a long history of levamisole induced vasculitis and has had AKA bilaterally and finger amputations. 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  In the ED she was found to have an elevated WBC, lactic acidosis, temperature of 100.3 and there was concern for sepsis. She last smoked crack 1 day prior, and is a daily tobacco smoker.   Assessment/Plan:  Sepsis v/s SIRS, unknown source. Likely due to BL AKA/stump wounds. Elevated WBC, lactic acidosis on admission. UA not consistent with infection - admit CXR showed a small nodular density but f/u CXR 11/11 revealed only minimal atx. Chronic skin breakdown on her stumps, persistent purulent drainage with odor. MRI BL LE stumps: no clear osteomyelitis.  -sepsis physiology>resolved.  Cont iv antibiotics for 24-48 hrs then deescalate, blood cultures: NGTD. Cont pain control. Consulted wound care.     Altered mental status. CT head: no acute infarcts. ammonia level is not elevated. Possible cocaine induced chronic ischemia on top of metabolic encephalopathy.  -improving, avoid sedatives as able   Acute kidney injury. crt has responded well to volume resuscitation and remains normal   Acute liver failure. Elevated LFTs. Workup at the end of October revealed negative hepatitis labs and CT noted hepatic congestion. suspect alcohol induced hepatitis vs hepatic congestion from diastolic CHF v/s shock liver  - LFTs now improving. Monitor   Chronic diastolic CHF. pulmonary HTN. Echo (2019): LVEF 60-65%. PA pressure 56 mmHg. +MR.  -Resumed lasix. Not on BB due to chronic cocaine use. Resume losartan when stable.   Monitor   Essential hypertension. Cont amlodipine for now.    Cocaine-induced vascular disorder  Protein-calorie malnutrition, severe. Due to ongoing polysubstance abuse   Anemia. Thrombocytopenia. Likely due to sepsis on top of liver fialure. Will hold lovenox.   Prognosis is guarded with ongoing cocaine use, suspect cocaine induced organ dysfunction, vasculitis, infection, sepsis, heart failure, liver failure, severe malnutrition. Appreciate palliative care eval     Code Status: full Family Communication: d/w patient, RN (indicate person spoken with, relationship, and if by phone, the number) Disposition Plan: SDU_med surg today  -dispo: challenging. appears patient  Is homeless. Will consult SW   Consultants:  none  Procedures:  none  Antibiotics: Anti-infectives (From admission, onward)   Start     Dose/Rate Route Frequency Ordered Stop   08/15/18 1000  ceFEPIme (MAXIPIME) 1 g in sodium chloride 0.9 % 100 mL IVPB     1 g 200 mL/hr over 30 Minutes Intravenous Every 24 hours 08/14/18 1020     08/14/18 0600  vancomycin (VANCOCIN) IVPB 1000 mg/200 mL premix     1,000 mg 200 mL/hr over 60 Minutes Intravenous Every 24 hours 08/13/18 0646     08/13/18 2200  ceFEPIme (MAXIPIME) 1 g in sodium chloride 0.9 % 100 mL IVPB  Status:  Discontinued     1 g 200 mL/hr over 30 Minutes Intravenous Every 12 hours 08/13/18 0646 08/14/18 1020   08/13/18 0415  ceFEPIme (MAXIPIME) 2 g in sodium chloride 0.9 % 100 mL IVPB     2 g 200 mL/hr over 30 Minutes Intravenous  Once 08/13/18 0414 08/13/18 0451   08/13/18 0415  metroNIDAZOLE (FLAGYL) IVPB 500 mg  Status:  Discontinued     500 mg 100 mL/hr over 60 Minutes Intravenous Every 8 hours 08/13/18 0414 08/13/18 0734   08/13/18 0415  vancomycin (VANCOCIN) IVPB 1000 mg/200 mL premix     1,000 mg 200 mL/hr over 60 Minutes Intravenous  Once 08/13/18 0414 08/13/18 0648       (indicate start date, and stop date if  known)  HPI/Subjective: Afebrile overnight. Less confused, no focal neuro symptoms. Reports chronic leg pains  Objective: Vitals:   08/19/18 0504 08/19/18 0822  BP: (!) 143/92 (!) 146/121  Pulse: 91   Resp: (!) 27   Temp: 98.2 F (36.8 C)   SpO2: 94%     Intake/Output Summary (Last 24 hours) at 08/19/2018 0954 Last data filed at 08/19/2018 0500 Gross per 24 hour  Intake -  Output 650 ml  Net -650 ml   Filed Weights   08/18/18 1550  Weight: 47 kg    Exam:   General:  Looks chronically ill   Cardiovascular: s1,s2 rrr  Respiratory: diminished in LL  Abdomen: soft, nt   Musculoskeletal: BL AKA. Stump with purulent drainage    Data Reviewed: Basic Metabolic Panel: Recent Labs  Lab 08/14/18 0401 08/15/18 0356 08/16/18 0335 08/17/18 0332 08/18/18 0416  NA 134* 135 134* 134* 136  K 4.4 4.1 4.4 4.3 3.8  CL 109 111 110 110 109  CO2 17* 18* 16* 17* 17*  GLUCOSE 140* 131* 80 92 95  BUN 40* 30* 27* 21* 15  CREATININE 1.09* 0.84 0.88 0.69 0.72  CALCIUM 8.2* 8.1* 8.1* 8.1* 8.0*  MG  --   --  2.0 1.8  --   PHOS  --   --  1.8* 1.5* 2.8   Liver Function Tests: Recent Labs  Lab 08/14/18 0401 08/15/18 0356 08/16/18 0335 08/17/18 0332 08/18/18 0416  AST 927* 941* 616* 287* 137*  ALT 496* 670* 610* 457* 320*  ALKPHOS 98 103 103 98 95  BILITOT 4.3* 4.0* 4.3* 4.0* 3.7*  PROT 5.8* 5.8* 6.1* 6.0* 5.8*  ALBUMIN 2.8* 2.6* 2.6* 2.5* 2.3*   No results for input(s): LIPASE, AMYLASE in the last 168 hours. Recent Labs  Lab 08/16/18 1655 08/17/18 0332  AMMONIA 32 30   CBC: Recent Labs  Lab 08/13/18 0245 08/14/18 0401 08/15/18 0356 08/16/18 0335 08/17/18 0332 08/18/18 0416  WBC 27.0* 17.2* 11.3* 14.0* 13.4* 15.0*  NEUTROABS 23.7*  --   --   --   --   --   HGB 9.8* 9.8* 9.8* 10.1* 10.4* 9.2*  HCT 33.2* 32.2* 32.9* 34.5* 34.5* 31.7*  MCV 86.7 85.9 85.7 86.5 85.6 86.4  PLT 293 220 154 92* 71* 106*   Cardiac Enzymes: No results for input(s): CKTOTAL, CKMB,  CKMBINDEX, TROPONINI in the last 168 hours. BNP (last 3 results) Recent Labs    05/19/18 2116 06/20/18 2239  BNP 3,603.1* 3,612.1*    ProBNP (last 3 results) No results for input(s): PROBNP in the last 8760 hours.  CBG: Recent Labs  Lab 08/13/18 0814 08/14/18 0756 08/14/18 1129  GLUCAP 80 111* 199*    Recent Results (from the past 240 hour(s))  Blood culture (routine x 2)     Status: None   Collection Time: 08/13/18  3:35 AM  Result Value Ref Range Status   Specimen Description BLOOD SITE NOT SPECIFIED  Final   Special Requests   Final    BOTTLES DRAWN AEROBIC ONLY Blood Culture adequate volume   Culture   Final  NO GROWTH 5 DAYS Performed at [facility], [place] [address] [place], [place] [address]    Report Status 08/18/2018 FINAL  Final  Blood culture (routine x 2)     Status: None   Collection Time: 08/13/18  4:15 AM  Result Value Ref Range Status   Specimen Description BLOOD RIGHT ELBOW  Final   Special Requests   Final    BOTTLES DRAWN AEROBIC AND ANAEROBIC Blood Culture adequate volume   Culture   Final    NO GROWTH 5 DAYS Performed at [facility], [place] [address] [place], [place] [address]    Report Status 08/18/2018 FINAL  Final     Studies: Mr Femur Right W Wo Contrast  Result Date: 08/18/2018 CLINICAL DATA:  57 y.o. female with medical history significant of Levamisole vasculitis, RA, anemia, migraine, HTN, depression, cocaine use who presents for bilateral LE pain. Bilateral AKA. Chronic pain for wounds of the lower extremity at the stumps. EXAM: MR OF THE LEFT LOWER EXTREMITY WITHOUT AND WITH CONTRAST MRI OF THE RIGHT FEMUR WITHOUT AND WITH CONTRAST TECHNIQUE: Multiplanar, multisequence MR imaging of the right lower extremity was performed both before and after administration of intravenous contrast. Multiplanar, multisequence MR imaging of the left lower extremity was performed both before and after administration of intravenous contrast.  CONTRAST:  2 mL Gadavist COMPARISON:  None. FINDINGS: Bones/Joint/Cartilage Bilateral above the knee amputation through the distal femoral diaphysis. No periosteal reaction or bone destruction. No aggressive osseous lesion. No T1 signal abnormality in bilateral femurs. No acute fracture or dislocation. Mild osteoarthritis of bilateral hips. Small bilateral hip joint effusions. Normal alignment. Ligaments, Muscles and Tendons Mild diffuse T2 hyperintense signal throughout the pelvic and femoral musculature without significant enhancement likely neurogenic. No intramuscular drainable fluid collection or hematoma. Soft tissue Diffuse soft tissue edema throughout the subcutaneous fat of the pelvis and bilateral femurs which may be secondary to anasarca or fluid overload versus severe cellulitis. T2 hyperintense well-circumscribed fluid collection adjacent to the pectineus muscle bilaterally without enhancement unchanged compared with prior CT abdomen dated 06/21/2018. No other drainable fluid collection. Moderate pelvic free fluid. IMPRESSION: 1. Bilateral above the knee amputation through the distal femoral diaphysis without evidence of osteomyelitis. 2. Diffuse soft tissue edema throughout the subcutaneous fat of the pelvis and bilateral femurs which may be secondary to anasarca. No drainable fluid collection to suggest an abscess. 3. Mild osteoarthritis of bilateral hips with bilateral hip joint effusions. Electronically Signed   By: Kathreen Devoid   On: 08/18/2018 08:34   Mr Femur Left W Wo Contrast  Result Date: 08/18/2018 CLINICAL DATA:  57 y.o. female with medical history significant of Levamisole vasculitis, RA, anemia, migraine, HTN, depression, cocaine use who presents for bilateral LE pain. Bilateral AKA. Chronic pain for wounds of the lower extremity at the stumps. EXAM: MR OF THE LEFT LOWER EXTREMITY WITHOUT AND WITH CONTRAST MRI OF THE RIGHT FEMUR WITHOUT AND WITH CONTRAST TECHNIQUE: Multiplanar,  multisequence MR imaging of the right lower extremity was performed both before and after administration of intravenous contrast. Multiplanar, multisequence MR imaging of the left lower extremity was performed both before and after administration of intravenous contrast. CONTRAST:  2 mL Gadavist COMPARISON:  None. FINDINGS: Bones/Joint/Cartilage Bilateral above the knee amputation through the distal femoral diaphysis. No periosteal reaction or bone destruction. No aggressive osseous lesion. No T1 signal abnormality in bilateral femurs. No acute fracture or dislocation. Mild osteoarthritis of bilateral hips. Small bilateral hip joint effusions.  Normal alignment. Ligaments, Muscles and Tendons Mild diffuse T2 hyperintense signal throughout the pelvic and femoral musculature without significant enhancement likely neurogenic. No intramuscular drainable fluid collection or hematoma. Soft tissue Diffuse soft tissue edema throughout the subcutaneous fat of the pelvis and bilateral femurs which may be secondary to anasarca or fluid overload versus severe cellulitis. T2 hyperintense well-circumscribed fluid collection adjacent to the pectineus muscle bilaterally without enhancement unchanged compared with prior CT abdomen dated 06/21/2018. No other drainable fluid collection. Moderate pelvic free fluid. IMPRESSION: 1. Bilateral above the knee amputation through the distal femoral diaphysis without evidence of osteomyelitis. 2. Diffuse soft tissue edema throughout the subcutaneous fat of the pelvis and bilateral femurs which may be secondary to anasarca. No drainable fluid collection to suggest an abscess. 3. Mild osteoarthritis of bilateral hips with bilateral hip joint effusions. Electronically Signed   By: Kathreen Devoid   On: 08/18/2018 08:34    Scheduled Meds: . amLODipine  10 mg Oral Daily  . feeding supplement (ENSURE ENLIVE)  237 mL Oral BID BM  . furosemide  20 mg Intravenous Daily   Continuous Infusions: .  ceFEPime (MAXIPIME) IV 1 g (08/18/18 1000)  . vancomycin 1,000 mg (08/19/18 5872)    Active Problems:   Essential hypertension   Cocaine-induced vascular disorder (HCC)   Protein-calorie malnutrition, severe ([name])   Acute kidney injury ([name])   Tobacco abuse   S/P bilateral below knee amputation ([name])   Ulcer of amputation stump of lower extremity ([name])   Sepsis ([name])   Elevated LFTs   Goals of care, counseling/discussion   Palliative care by specialist   DNR (do not resuscitate) discussion    Time spent: >35 minutes     Kinnie Feil  [facility] Pager [phone]. If 7PM-7AM, please contact night-coverage at [email or web address], password [email or web address] 08/19/2018, 9:54 AM  LOS: 6 days

## 2018-08-20 ENCOUNTER — Inpatient Hospital Stay (HOSPITAL_COMMUNITY): Payer: Medicaid Other

## 2018-08-20 ENCOUNTER — Other Ambulatory Visit: Payer: Self-pay

## 2018-08-20 DIAGNOSIS — Z720 Tobacco use: Secondary | ICD-10-CM

## 2018-08-20 DIAGNOSIS — G92 Toxic encephalopathy: Secondary | ICD-10-CM

## 2018-08-20 DIAGNOSIS — D696 Thrombocytopenia, unspecified: Secondary | ICD-10-CM

## 2018-08-20 DIAGNOSIS — Z590 Homelessness: Secondary | ICD-10-CM

## 2018-08-20 DIAGNOSIS — G929 Unspecified toxic encephalopathy: Secondary | ICD-10-CM

## 2018-08-20 LAB — COMPREHENSIVE METABOLIC PANEL WITH GFR
ALT: 173 U/L — ABNORMAL HIGH (ref 0–44)
AST: 51 U/L — ABNORMAL HIGH (ref 15–41)
Albumin: 2.1 g/dL — ABNORMAL LOW (ref 3.5–5.0)
Alkaline Phosphatase: 90 U/L (ref 38–126)
Anion gap: 7 (ref 5–15)
BUN: 10 mg/dL (ref 6–20)
CO2: 23 mmol/L (ref 22–32)
Calcium: 8 mg/dL — ABNORMAL LOW (ref 8.9–10.3)
Chloride: 107 mmol/L (ref 98–111)
Creatinine, Ser: 0.79 mg/dL (ref 0.44–1.00)
GFR calc Af Amer: >60 mL/min
GFR calc non Af Amer: >60 mL/min
Glucose, Bld: 78 mg/dL (ref 70–99)
Potassium: 3.4 mmol/L — ABNORMAL LOW (ref 3.5–5.1)
Sodium: 137 mmol/L (ref 135–145)
Total Bilirubin: 4.5 mg/dL — ABNORMAL HIGH (ref 0.3–1.2)
Total Protein: 5.4 g/dL — ABNORMAL LOW (ref 6.5–8.1)

## 2018-08-20 LAB — CBC
HCT: 33.9 % — ABNORMAL LOW (ref 36.0–46.0)
Hemoglobin: 10.4 g/dL — ABNORMAL LOW (ref 12.0–15.0)
MCH: 25.8 pg — ABNORMAL LOW (ref 26.0–34.0)
MCHC: 30.7 g/dL (ref 30.0–36.0)
MCV: 84.1 fL (ref 80.0–100.0)
Platelets: 153 10*3/uL (ref 150–400)
RBC: 4.03 MIL/uL (ref 3.87–5.11)
RDW: 20 % — ABNORMAL HIGH (ref 11.5–15.5)
WBC: 11.5 10*3/uL — ABNORMAL HIGH (ref 4.0–10.5)
nRBC: 0 % (ref 0.0–0.2)

## 2018-08-20 LAB — PROTIME-INR
INR: 1.51
PROTHROMBIN TIME: 18 s — AB (ref 11.4–15.2)

## 2018-08-20 LAB — VANCOMYCIN, TROUGH: Vancomycin Tr: 9 ug/mL — ABNORMAL LOW (ref 15–20)

## 2018-08-20 MED ORDER — VANCOMYCIN HCL IN DEXTROSE 750-5 MG/150ML-% IV SOLN
750.0000 mg | Freq: Two times a day (BID) | INTRAVENOUS | Status: DC
  Administered 2018-08-20 – 2018-08-21 (×2): 750 mg via INTRAVENOUS
  Filled 2018-08-20 (×3): qty 150

## 2018-08-20 MED ORDER — AMOXICILLIN-POT CLAVULANATE 875-125 MG PO TABS
1.0000 | ORAL_TABLET | Freq: Two times a day (BID) | ORAL | Status: DC
  Administered 2018-08-20 – 2018-08-23 (×6): 1 via ORAL
  Filled 2018-08-20 (×6): qty 1

## 2018-08-20 MED ORDER — POTASSIUM CHLORIDE CRYS ER 20 MEQ PO TBCR
40.0000 meq | EXTENDED_RELEASE_TABLET | ORAL | Status: AC
  Administered 2018-08-20 (×2): 40 meq via ORAL
  Filled 2018-08-20 (×2): qty 2

## 2018-08-20 MED ORDER — ENOXAPARIN SODIUM 30 MG/0.3ML ~~LOC~~ SOLN
30.0000 mg | SUBCUTANEOUS | Status: DC
  Administered 2018-08-20 – 2018-08-26 (×6): 30 mg via SUBCUTANEOUS
  Filled 2018-08-20 (×6): qty 0.3

## 2018-08-20 MED ORDER — DOXYCYCLINE HYCLATE 100 MG PO TABS
100.0000 mg | ORAL_TABLET | Freq: Two times a day (BID) | ORAL | Status: DC
  Administered 2018-08-20 – 2018-08-23 (×6): 100 mg via ORAL
  Filled 2018-08-20 (×6): qty 1

## 2018-08-20 MED ORDER — SODIUM CHLORIDE 0.9 % IV SOLN
1.0000 g | Freq: Two times a day (BID) | INTRAVENOUS | Status: DC
  Administered 2018-08-20: 1 g via INTRAVENOUS
  Filled 2018-08-20 (×2): qty 1

## 2018-08-20 MED ORDER — FUROSEMIDE 10 MG/ML IJ SOLN
20.0000 mg | Freq: Two times a day (BID) | INTRAMUSCULAR | Status: DC
  Administered 2018-08-20 – 2018-08-23 (×6): 20 mg via INTRAVENOUS
  Filled 2018-08-20 (×6): qty 2

## 2018-08-20 NOTE — Progress Notes (Signed)
PROGRESS NOTE    [name]   CXK:[id number]  DOB: 11-29-1960  DOA: 08/13/2018 PCP: Azzie Glatter, FNP   Brief Narrative:  [name] 57 y.o.femalewith a hx of Levimisole vasculitis, RA, anemia, migraine, HTN, depression, substance abuse, cocaine use who presented for B LE pain, draiange Patient has had a long history of levamisole induced vasculitis and has had AKA bilaterally and finger amputations. 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  In the ED she was found to have an elevated WBC, lactic acidosis, temperature of 100.3 and there was concern for sepsis. She last smoked crack 1 day prior, and is a daily tobacco smoker.   This is the 7th admission this year.   Subjective: No complaints other than pain when she has dressing changes.     Assessment & Plan:   Principal Problem:   Severe Sepsis - WBC 17.2, temp 100.3, HR > 100 with lactic acidosis and liver failure   S/P bilateral below knee amputation  - suspected to be due to chronic wounds which are now infected- fould odor and drainage in triage - currently on Vanc and Cefepime (since admission)- - WBC improving- change to Doxy and Augmentin - cultures neg - MRI neg for osteomyelitis - follows with Dr Sharol Given- I have spoken with him and he will see the patient tomorrow  Active Problems:   Acute kidney injury/ prerenal - resolved with hydration    Acute respiratory failure- acute on chronic d CHF- grade 2 - possibly fluid overloaded-  cont IV Lasix but increase frequency - urinating well today - she also has a cough with congestion - CXR > diffuse infiltrates- infection vs pulm edema   - admitted for exacerbation in Sept of this year because she ran out of medications - on Norvasc and Lasix - Cozaar on hold  Coagulopathy- acquired  INR 3.06 on 11/10 but not previously this elevated - repeated > 1.51 today which is her baseline  Levamisol induced vasculitis   Protein-calorie malnutrition, severe  Malnutrition Type: Nutrition Problem: Moderate Malnutrition Etiology: social / environmental circumstances(limited access to food) Malnutrition Characteristics: Signs/Symptoms: energy intake < or equal to 75% for > or equal to 1 month, moderate fat depletion, mild muscle depletion Nutrition Interventions: Interventions: Nepro shake, MVI    Elevated LFTs- thrombocytopenia - has h/o hepatomegaly  - elevated LFTs may be due to sepsis vs cocaine induced injury - now improving  Hypokalemia - replace  Homeless  Tobacco abuse - cont Nicotine patch    DVT prophylaxis:  Lovenox Code Status: DNR Family Communication:  Disposition Plan: possibly SNF- PT eval ordered Consultants:  - called ortho Procedures:    Antimicrobials:  Anti-infectives (From admission, onward)   Start     Dose/Rate Route Frequency Ordered Stop   08/20/18 2030  vancomycin (VANCOCIN) IVPB 750 mg/150 ml premix     750 mg 150 mL/hr over 60 Minutes Intravenous Every 12 hours 08/20/18 0855     08/20/18 1000  ceFEPIme (MAXIPIME) 1 g in sodium chloride 0.9 % 100 mL IVPB     1 g 200 mL/hr over 30 Minutes Intravenous Every 12 hours 08/20/18 0839     08/15/18 1000  ceFEPIme (MAXIPIME) 1 g in sodium chloride 0.9 % 100 mL IVPB  Status:  Discontinued     1 g 200 mL/hr over 30 Minutes Intravenous Every 24 hours 08/14/18 1020 08/20/18 0839   08/14/18 0600  vancomycin (VANCOCIN) IVPB 1000 mg/200 mL  premix  Status:  Discontinued     1,000 mg 200 mL/hr over 60 Minutes Intravenous Every 24 hours 08/13/18 0646 08/20/18 0855   08/13/18 2200  ceFEPIme (MAXIPIME) 1 g in sodium chloride 0.9 % 100 mL IVPB  Status:  Discontinued     1 g 200 mL/hr over 30 Minutes Intravenous Every 12 hours 08/13/18 0646 08/14/18 1020   08/13/18 0415  ceFEPIme (MAXIPIME) 2 g in sodium chloride 0.9 % 100 mL IVPB     2 g 200 mL/hr over 30 Minutes Intravenous  Once 08/13/18 0414 08/13/18 0451   08/13/18 0415   metroNIDAZOLE (FLAGYL) IVPB 500 mg  Status:  Discontinued     500 mg 100 mL/hr over 60 Minutes Intravenous Every 8 hours 08/13/18 0414 08/13/18 0734   08/13/18 0415  vancomycin (VANCOCIN) IVPB 1000 mg/200 mL premix     1,000 mg 200 mL/hr over 60 Minutes Intravenous  Once 08/13/18 0414 08/13/18 0648       Objective: Vitals:   08/19/18 0822 08/19/18 0825 08/19/18 2006 08/20/18 0554  BP: (!) 146/121 (!) 146/121 132/75 139/87  Pulse:  92  87  Resp:  (!) 26  17  Temp:   97.6 F (36.4 C) 98.7 F (37.1 C)  TempSrc:   Oral Oral  SpO2:  100%  97%  Weight:      Height:        Intake/Output Summary (Last 24 hours) at 08/20/2018 0904 Last data filed at 08/19/2018 1900 Gross per 24 hour  Intake -  Output 450 ml  Net -450 ml   Filed Weights   08/18/18 1550  Weight: 47 kg    Examination: General exam: Appears comfortable  HEENT: PERRLA, oral mucosa moist, no sclera icterus or thrush Respiratory system: Clear to auscultation. Respiratory effort normal. Cardiovascular system: S1 & S2 heard, RRR.   Gastrointestinal system: Abdomen soft, non-tender, nondistended. Normal bowel sounds. Central nervous system: Alert and oriented. No focal neurological deficits. Extremities: No cyanosis, clubbing or edema- b/l AKA Skin: No rashes or ulcers Psychiatry:  Flat affect          Data Reviewed: I have personally reviewed following labs and imaging studies  CBC: Recent Labs  Lab 08/14/18 0401 08/15/18 0356 08/16/18 0335 08/17/18 0332 08/18/18 0416  WBC 17.2* 11.3* 14.0* 13.4* 15.0*  HGB 9.8* 9.8* 10.1* 10.4* 9.2*  HCT 32.2* 32.9* 34.5* 34.5* 31.7*  MCV 85.9 85.7 86.5 85.6 86.4  PLT 220 154 92* 71* 161*   Basic Metabolic Panel: Recent Labs  Lab 08/15/18 0356 08/16/18 0335 08/17/18 0332 08/18/18 0416 08/20/18 0639  NA 135 134* 134* 136 137  K 4.1 4.4 4.3 3.8 3.4*  CL 111 110 110 109 107  CO2 18* 16* 17* 17* 23  GLUCOSE 131* 80 92 95 78  BUN 30* 27* 21* 15 10    CREATININE 0.84 0.88 0.69 0.72 0.79  CALCIUM 8.1* 8.1* 8.1* 8.0* 8.0*  MG  --  2.0 1.8  --   --   PHOS  --  1.8* 1.5* 2.8  --    GFR: Estimated Creatinine Clearance: 59 mL/min (by C-G formula based on SCr of 0.79 mg/dL). Liver Function Tests: Recent Labs  Lab 08/15/18 0356 08/16/18 0335 08/17/18 0332 08/18/18 0416 08/20/18 0639  AST 941* 616* 287* 137* 51*  ALT 670* 610* 457* 320* 173*  ALKPHOS 103 103 98 95 90  BILITOT 4.0* 4.3* 4.0* 3.7* 4.5*  PROT 5.8* 6.1* 6.0* 5.8* 5.4*  ALBUMIN 2.6* 2.6* 2.5*  2.3* 2.1*   No results for input(s): LIPASE, AMYLASE in the last 168 hours. Recent Labs  Lab 08/16/18 1655 08/17/18 0332  AMMONIA 32 30   Coagulation Profile: Recent Labs  Lab 08/14/18 0401  INR 3.06   Cardiac Enzymes: No results for input(s): CKTOTAL, CKMB, CKMBINDEX, TROPONINI in the last 168 hours. BNP (last 3 results) No results for input(s): PROBNP in the last 8760 hours. HbA1C: No results for input(s): HGBA1C in the last 72 hours. CBG: Recent Labs  Lab 08/14/18 0756 08/14/18 1129  GLUCAP 111* 199*   Lipid Profile: No results for input(s): CHOL, HDL, LDLCALC, TRIG, CHOLHDL, LDLDIRECT in the last 72 hours. Thyroid Function Tests: No results for input(s): TSH, T4TOTAL, FREET4, T3FREE, THYROIDAB in the last 72 hours. Anemia Panel: No results for input(s): VITAMINB12, FOLATE, FERRITIN, TIBC, IRON, RETICCTPCT in the last 72 hours. Urine analysis:    Component Value Date/Time   COLORURINE AMBER (A) 08/13/2018 0552   APPEARANCEUR CLEAR 08/13/2018 0552   LABSPEC 1.017 08/13/2018 0552   PHURINE 5.0 08/13/2018 0552   GLUCOSEU NEGATIVE 08/13/2018 0552   HGBUR MODERATE (A) 08/13/2018 0552   BILIRUBINUR NEGATIVE 08/13/2018 0552   BILIRUBINUR small 04/29/2015 1533   KETONESUR 5 (A) 08/13/2018 0552   PROTEINUR 100 (A) 08/13/2018 0552   UROBILINOGEN 1.0 07/26/2017 1550   NITRITE NEGATIVE 08/13/2018 0552   LEUKOCYTESUR NEGATIVE 08/13/2018 0552   Sepsis  Labs: @LABRCNTIP (procalcitonin:4,lacticidven:4) ) Recent Results (from the past 240 hour(s))  Blood culture (routine x 2)     Status: None   Collection Time: 08/13/18  3:35 AM  Result Value Ref Range Status   Specimen Description BLOOD SITE NOT SPECIFIED  Final   Special Requests   Final    BOTTLES DRAWN AEROBIC ONLY Blood Culture adequate volume   Culture   Final    NO GROWTH 5 DAYS Performed at [facility], [place] [address], [place], [place] [address]    Report Status 08/18/2018 FINAL  Final  Blood culture (routine x 2)     Status: None   Collection Time: 08/13/18  4:15 AM  Result Value Ref Range Status   Specimen Description BLOOD RIGHT ELBOW  Final   Special Requests   Final    BOTTLES DRAWN AEROBIC AND ANAEROBIC Blood Culture adequate volume   Culture   Final    NO GROWTH 5 DAYS Performed at [facility], [place] [address] [place], [place] [address]    Report Status 08/18/2018 FINAL  Final         Radiology Studies: No results found.    Scheduled Meds: . amLODipine  10 mg Oral Daily  . feeding supplement (ENSURE ENLIVE)  237 mL Oral BID BM  . furosemide  20 mg Intravenous Daily  . polyethylene glycol  17 g Oral Daily   Continuous Infusions: . ceFEPime (MAXIPIME) IV    . vancomycin       LOS: 7 days    Time spent in minutes: 40    Debbe Odea, MD [facility] Pager: [email or web address] Password [email or web address] 08/20/2018, 9:04 AM

## 2018-08-20 NOTE — Progress Notes (Signed)
Pharmacy Antibiotic Note  [name] is a 57 y.o. female admitted on 08/13/2018 with sepsis of unclear etiology. Pt has hx of bilateral AKAs with chronic stump wounds as possible source. Pharmacy has been consulted for Vancomycin/Cefepime dosing.   Day #6 of abx for sepsis of unclear etiology -  Possibly wound from stumps as patient has chronic skin breakdown on her stumps with persistent purulent drainage and odor, however MRI shows no clear osteomyelitis. Patient is afebrile, WBC up this morning at 15. Vancomycin trough at steady state is 9 this morning.     Plan: Increase vancomycin to 750 mg IV q12h  Increase cefepime to 1g IV q12h Monitor clinical picture, renal function, VT prn F/U C&S, abx deescalation / LOT  Temp (24hrs), Avg:98.2 F (36.8 C), Min:97.6 F (36.4 C), Max:98.7 F (37.1 C)  Recent Labs  Lab 08/13/18 1008  08/14/18 0401 08/15/18 0356 08/16/18 0335 08/17/18 0332 08/18/18 0416 08/20/18 0639  WBC  --   --  17.2* 11.3* 14.0* 13.4* 15.0*  --   CREATININE  --    < > 1.09* 0.84 0.88 0.69 0.72 0.79  LATICACIDVEN 2.7*  --   --  2.1*  --   --   --   --   VANCOTROUGH  --   --   --   --   --   --   --  9*   < > = values in this interval not displayed.    Estimated Creatinine Clearance: 59 mL/min (by C-G formula based on SCr of 0.79 mg/dL).    Allergies  Allergen Reactions  . Acetaminophen Swelling and Other (See Comments)    Reaction:  Eyelid swelling  . Haldol [Haloperidol Lactate]     Possible eye swelling was given together with Toradol unsure if caused the reaction  . Lisinopril     UNSPECIFIED REACTION   . Toradol [Ketorolac Tromethamine]     Eye swelling    Brendolyn Patty, PharmD PGY1 Pharmacy Resident Phone [phone]  08/20/2018   8:26 AM

## 2018-08-21 DIAGNOSIS — T8789 Other complications of amputation stump: Secondary | ICD-10-CM

## 2018-08-21 DIAGNOSIS — Z89612 Acquired absence of left leg above knee: Secondary | ICD-10-CM

## 2018-08-21 DIAGNOSIS — L97909 Non-pressure chronic ulcer of unspecified part of unspecified lower leg with unspecified severity: Secondary | ICD-10-CM

## 2018-08-21 DIAGNOSIS — J9621 Acute and chronic respiratory failure with hypoxia: Secondary | ICD-10-CM

## 2018-08-21 DIAGNOSIS — Z89611 Acquired absence of right leg above knee: Secondary | ICD-10-CM

## 2018-08-21 DIAGNOSIS — I5023 Acute on chronic systolic (congestive) heart failure: Secondary | ICD-10-CM

## 2018-08-21 DIAGNOSIS — I70261 Atherosclerosis of native arteries of extremities with gangrene, right leg: Secondary | ICD-10-CM

## 2018-08-21 DIAGNOSIS — E43 Unspecified severe protein-calorie malnutrition: Secondary | ICD-10-CM

## 2018-08-21 DIAGNOSIS — I70262 Atherosclerosis of native arteries of extremities with gangrene, left leg: Secondary | ICD-10-CM

## 2018-08-21 LAB — BASIC METABOLIC PANEL
Anion gap: 9 (ref 5–15)
BUN: 13 mg/dL (ref 6–20)
CO2: 23 mmol/L (ref 22–32)
CREATININE: 0.89 mg/dL (ref 0.44–1.00)
Calcium: 8.7 mg/dL — ABNORMAL LOW (ref 8.9–10.3)
Chloride: 105 mmol/L (ref 98–111)
GFR calc Af Amer: >60 mL/min (ref >60)
Glucose, Bld: 92 mg/dL (ref 70–99)
Potassium: 4.3 mmol/L (ref 3.5–5.1)
SODIUM: 137 mmol/L (ref 135–145)

## 2018-08-21 LAB — CBC
HCT: 33.5 % — ABNORMAL LOW (ref 36.0–46.0)
Hemoglobin: 10 g/dL — ABNORMAL LOW (ref 12.0–15.0)
MCH: 24.9 pg — ABNORMAL LOW (ref 26.0–34.0)
MCHC: 29.9 g/dL — AB (ref 30.0–36.0)
MCV: 83.3 fL (ref 80.0–100.0)
Platelets: 216 10*3/uL (ref 150–400)
RBC: 4.02 MIL/uL (ref 3.87–5.11)
RDW: 19.9 % — AB (ref 11.5–15.5)
WBC: 12.8 10*3/uL — AB (ref 4.0–10.5)
nRBC: 0 % (ref 0.0–0.2)

## 2018-08-21 NOTE — Evaluation (Signed)
Physical Therapy Evaluation Patient Details Name: [name] MRN: [id number] DOB: 01-Feb-1961 Today's Date: 08/21/2018   History of Present Illness  57 year old female with history of cocaine abuse, grade 2 diastolic congestive heart failure with EF of 60 to 65%, essential hypertension, peripheral vascular disease status post bilateral AKA, homeless presented with AKA stump pain and redness with x-ray femur concerning for osteomyelitis.  Clinical Impression  Orders received for PT evaluation. Patient demonstrates deficits in functional mobility as indicated below. Will benefit from continued skilled PT to address deficits and maximize function. Will see as indicated and progress as tolerated.  At this time, pain is extremely limiting factor.    Follow Up Recommendations No PT follow up;SNF;Supervision for mobility/OOB    Equipment Recommendations  (TBD)    Recommendations for Other Services       Precautions / Restrictions Precautions Precautions: Fall Restrictions Weight Bearing Restrictions: No      Mobility  Bed Mobility Overal bed mobility: Needs Assistance Bed Mobility: Rolling;Supine to Sit Rolling: Mod assist   Supine to sit: Mod assist     General bed mobility comments: Increased time and effort, moderate assist to initiate roll, limited by pain in bilateral directions  Transfers Overall transfer level: Needs assistance   Transfers: Anterior-Posterior Transfer       Anterior-Posterior transfers: +2 physical assistance;Total assist   General transfer comment: Patient unable to participate in transfer due to pain, total assist +2 to a/p to chair  Ambulation/Gait                Stairs            Wheelchair Mobility    Modified Rankin (Stroke Patients Only)       Balance Overall balance assessment: Needs assistance   Sitting balance-Leahy Scale: Poor Sitting balance - Comments: left lateral lean to off weight buttocks dur to pain                                      Pertinent Vitals/Pain Pain Assessment: Faces Pain Score: 10-Worst pain ever Faces Pain Scale: Hurts whole lot Pain Location: right AKA Pain Descriptors / Indicators: Grimacing;Constant;Tender Pain Intervention(s): Monitored during session    Home Living Family/patient expects to be discharged to:: Shelter/Homeless Living Arrangements: Alone                    Prior Function Level of Independence: Independent with assistive device(s)(Could get everywhere with wheelchair)         Comments: Pt reports she uses public restrooms and sponge baths. Pt uses gait belt to secure trunk into w/c and prevent falls. Pt was independent with transfers from benches to w/c.     Hand Dominance   Dominant Hand: Right    Extremity/Trunk Assessment        Lower Extremity Assessment RLE Deficits / Details: R AKA residual limb has increased sensitivity and pain with transfers/activity    Cervical / Trunk Assessment Cervical / Trunk Assessment: Normal  Communication   Communication: No difficulties  Cognition Arousal/Alertness: Awake/alert Behavior During Therapy: WFL for tasks assessed/performed Overall Cognitive Status: No family/caregiver present to determine baseline cognitive functioning Area of Impairment: Safety/judgement;Awareness                         Safety/Judgement: Decreased awareness of safety Awareness: Intellectual;Anticipatory  General Comments General comments (skin integrity, edema, etc.): hygiene and pericare performed    Exercises     Assessment/Plan    PT Assessment Patent does not need any further PT services  PT Problem List         PT Treatment Interventions      PT Goals (Current goals can be found in the Care Plan section)  Acute Rehab PT Goals Patient Stated Goal: none stated PT Goal Formulation: With patient Time For Goal Achievement: 08/16/18 Potential to Achieve  Goals: Fair    Frequency     Barriers to discharge        Co-evaluation               AM-PAC PT "6 Clicks" Daily Activity  Outcome Measure Difficulty turning over in bed (including adjusting bedclothes, sheets and blankets)?: Unable Difficulty moving from lying on back to sitting on the side of the bed? : Unable Difficulty sitting down on and standing up from a chair with arms (e.g., wheelchair, bedside commode, etc,.)?: Unable Help needed moving to and from a bed to chair (including a wheelchair)?: Total Help needed walking in hospital room?: Total Help needed climbing 3-5 steps with a railing? : Total 6 Click Score: 6    End of Session Equipment Utilized During Treatment: Oxygen Activity Tolerance: Patient limited by fatigue;Patient limited by pain Patient left: in chair;with call bell/phone within reach;with chair alarm set;with nursing/sitter in room Nurse Communication: Mobility status;Other (comment)(nurse informed pt was left in bed without external urinary catheter in place.) PT Visit Diagnosis: Pain;Muscle weakness (generalized) (M62.81) Pain - Right/Left: Right Pain - part of body: Knee(bilateral AKAs and buttocks)    Time: 6222-9798 PT Time Calculation (min) (ACUTE ONLY): 16 min   Charges:   PT Evaluation $PT Eval Moderate Complexity: 1 Mod          Alben Deeds, PT DPT  Board Certified Neurologic Specialist Acute Rehabilitation Services Pager [phone] Office [phone]   Duncan Dull 08/21/2018, 9:34 AM

## 2018-08-21 NOTE — Consult Note (Signed)
ORTHOPAEDIC CONSULTATION  REQUESTING PHYSICIAN: Debbe Odea, MD  Chief Complaint: Ischemic pain bilateral legs status post bilateral above-the-knee amputations.  HPI: [name] is a 57 y.o. female who presents with ischemic changes to bilateral above-the-knee amputations.  Patient complains of pain from both legs with ischemic ulcers.  Past Medical History:  Diagnosis Date  . CAP (community acquired pneumonia) 03/2014 X 2  . Cocaine abuse ([place])    ongoing with resultant vaculitis.  . Depression   . Headache    "weekly" (07/29/2016)  . Hypertension   . Inflammatory arthritis   . Migraines    "probably 5-6/yr" (07/29/2016)  . Normocytic anemia    BL Hgb 9.8-12. Last anemia panel 04/2010 - showing Fe 19, ferritin 101.  Pt on monthly B12 injections  . Rheumatoid arthritis(714.0)    patient reported  . 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 Followed by Dr. Louanne Skye   Past Surgical History:  Procedure Laterality Date  . AMPUTATION Left 05/22/2016   Procedure: AMPUTATION LEFT LONG FINGER;  Surgeon: Marybelle Killings, MD;  Location: [place];  Service: Orthopedics;  Laterality: Left;  . AMPUTATION Bilateral 04/10/2017   Procedure: AMPUTATION BELOW KNEE;  Surgeon: Newt Minion, MD;  Location: [place];  Service: Orthopedics;  Laterality: Bilateral;  . AMPUTATION Bilateral 02/06/2018   Procedure: AMPUTATION ABOVE KNEE;  Surgeon: Newt Minion, MD;  Location: [place];  Service: Orthopedics;  Laterality: Bilateral;  . HERNIA REPAIR     "stomach"  . I&D EXTREMITY Right 09/26/2015   Procedure: IRRIGATION AND DEBRIDEMENT LEG WOUND  VAC PLACEMENT.;  Surgeon: Loel Lofty Dillingham, DO;  Location: [place];  Service: Plastics;  Laterality: Right;  . INCISION AND DRAINAGE OF WOUND Bilateral 10/20/2016   Procedure: IRRIGATION AND DEBRIDEMENT WOUND BILATERAL;  Surgeon: Edrick Kins, DPM;  Location: [place];  Service: Podiatry;  Laterality: Bilateral;  .  IRRIGATION AND DEBRIDEMENT ABSCESS Bilateral 09/26/2013   Procedure: DEBRIDEMENT ULCERS BILATERAL THIGHS;  Surgeon: Gwenyth Ober, MD;  Location: [facility];  Service: General;  Laterality: Bilateral;  . SKIN BIOPSY Bilateral    shin nodules   Social History   Socioeconomic History  . Marital status: Single    Spouse name: Not on file  . Number of children: 0  . Years of education: 11th grade  . Highest education level: Not on file  Occupational History  . Occupation: Disability    Comment: since 2011, due to her rheumatoid arthritis  Social Needs  . Financial resource strain: Not very hard  . Food insecurity:    Worry: Sometimes true    Inability: Sometimes true  . Transportation needs:    Medical: Yes    Non-medical: Yes  Tobacco Use  . Smoking status: Current Every Day Smoker    Packs/day: 0.12    Years: 38.00    Pack years: 4.56    Types: Cigarettes  . Smokeless tobacco: Never Used  . Tobacco comment: 2 A DAY  Substance and Sexual Activity  . Alcohol use: No    Alcohol/week: 0.0 standard drinks  . Drug use: Yes    Types: "Crack" cocaine, Cocaine    Comment: Smoked crack 06/02/2017  . Sexual activity: Not Currently  Lifestyle  . Physical activity:    Days per week: 7 days    Minutes per session: Not on file  . Stress: Very much  Relationships  . Social connections:  Talks on phone: More than three times a week    Gets together: More than three times a week    Attends religious service: Never    Active member of club or organization: No    Attends meetings of clubs or organizations: Never    Relationship status: Never married  Other Topics Concern  . Not on file  Social History Narrative   Unemployed:  cleaning in past   Living at [place]; has Medicaid.   crack/cocaine use; pt denies IVDU   tobacco:  1/2 ppd, trying to quit   alcohol:  none       Family History  Problem Relation Age of Onset  . Breast cancer Mother        Breast cancer  .  Alcohol abuse Mother   . Colon cancer Maternal Aunt 17  . Alcohol abuse Father    - negative except otherwise stated in the family history section Allergies  Allergen Reactions  . Acetaminophen Swelling and Other (See Comments)    Reaction:  Eyelid swelling  . Haldol [Haloperidol Lactate]     Possible eye swelling was given together with Toradol unsure if caused the reaction  . Lisinopril     UNSPECIFIED REACTION   . Toradol [Ketorolac Tromethamine]     Eye swelling   Prior to Admission medications   Medication Sig Start Date End Date Taking? Authorizing Provider  amLODipine (NORVASC) 10 MG tablet Take 1 tablet (10 mg total) by mouth daily. Patient not taking: Reported on 08/11/2018 06/23/18   Patrecia Pour, Christean Grief, MD  furosemide (LASIX) 40 MG tablet Take 1 tablet (40 mg total) by mouth daily. Patient not taking: Reported on 08/11/2018 06/23/18   Patrecia Pour, Christean Grief, MD  gabapentin (NEURONTIN) 100 MG capsule Take 1 capsule (100 mg total) by mouth 3 (three) times daily. Patient not taking: Reported on 08/11/2018 07/24/18   Quintella Reichert, MD  losartan (COZAAR) 25 MG tablet Take 1 tablet (25 mg total) by mouth daily. Patient not taking: Reported on 08/11/2018 06/23/18   Patrecia Pour, Christean Grief, MD  ondansetron (ZOFRAN ODT) 4 MG disintegrating tablet Take 1 tablet (4 mg total) by mouth every 8 (eight) hours as needed for nausea or vomiting. Patient not taking: Reported on 08/11/2018 07/29/18   Montine Circle, PA-C  ondansetron (ZOFRAN) 4 MG tablet Take 1 tablet (4 mg total) by mouth every 6 (six) hours as needed for nausea or vomiting. 09/07/57   Delora Fuel, MD  pantoprazole (PROTONIX) 40 MG tablet Take 1 tablet (40 mg total) by mouth daily. 06/13/82   Delora Fuel, MD   Dg Chest Port 1 View  Result Date: 08/20/2018 CLINICAL DATA:  Hypoxia EXAM: PORTABLE CHEST 1 VIEW COMPARISON:  08/15/2018 FINDINGS: 1501 hours. Interval development of diffuse micro nodular opacity in both lungs with an upper  lobe predominance. Cardiopericardial silhouette is enlarged. No substantial pleural effusion. Nodular opacity overlying the right base is similar to a nodular opacity along the left chest wall, likely representing nipple shadows. The visualized bony structures of the thorax are intact. Telemetry leads overlie the chest. IMPRESSION: Cardiomegaly with diffuse interstitial and micro nodular opacity with an upper lung predominance. Imaging features are compatible with diffuse infection or pulmonary edema. Electronically Signed   By: Misty Stanley M.D.   On: 08/20/2018 15:19   - pertinent xrays, CT, MRI studies were reviewed and independently interpreted  Positive ROS: All other systems have been reviewed and were otherwise negative with the exception of those  mentioned in the HPI and as above.  Physical Exam: General: Alert, no acute distress Psychiatric: Patient is competent for consent with normal mood and affect Lymphatic: No axillary or cervical lymphadenopathy Cardiovascular: No pedal edema Respiratory: No cyanosis, no use of accessory musculature GI: No organomegaly, abdomen is soft and non-tender    Images:  @ENCIMAGES @  Labs:  Lab Results  Component Value Date   HGBA1C 5.0 05/30/2018   HGBA1C 5.5 03/30/2017   HGBA1C 5.1 04/21/2015   ESRSEDRATE 8 07/11/2018   ESRSEDRATE 104 (H) 04/08/2017   ESRSEDRATE 67 (H) 12/23/2016   CRP 5.3 (H) 08/02/2018   CRP 1.5 (H) 07/11/2018   CRP 2.0 (H) 04/08/2017   LABURIC 3.7 10/29/2010   LABURIC 3.4 10/22/2010   LABURIC 2.9 05/31/2009   REPTSTATUS 08/18/2018 FINAL 08/13/2018   GRAMSTAIN  10/23/2017    MODERATE WBC PRESENT, PREDOMINANTLY PMN MODERATE GRAM POSITIVE COCCI IN PAIRS IN CLUSTERS RARE GRAM NEGATIVE COCCOBACILLI    CULT  08/13/2018    NO GROWTH 5 DAYS Performed at [facility], [place] [address] [place], [place] [address]    LABORGA METHICILLIN RESISTANT STAPHYLOCOCCUS AUREUS 10/23/2017    Lab Results  Component Value  Date   ALBUMIN 2.1 (L) 08/20/2018   ALBUMIN 2.3 (L) 08/18/2018   ALBUMIN 2.5 (L) 08/17/2018   PREALBUMIN 13.6 (L) 04/08/2017   PREALBUMIN 14.7 (L) 10/11/2013   LABURIC 3.7 10/29/2010   LABURIC 3.4 10/22/2010   LABURIC 2.9 05/31/2009    Neurologic: Patient does not have protective sensation bilateral lower extremities.   MUSCULOSKELETAL:   Skin: Examination of both above-the-knee amputation shows ischemic ulceration to the surgical incision for both limbs.  There is a small amount of drainage there is no ascending cellulitis patient is exquisitely tender to light touch secondary to the ischemic changes of both legs.  Patient does have severe protein caloric malnutrition with an albumin of 2.1.  Assessment: Severe peripheral vascular disease with severe protein caloric malnutrition with ischemic ulcers bilateral above-the-knee amputations.  Plan: Plan: Would continue wound dressing changes.  I do not feel that revision surgery would heal.    Thank you for the consult and the opportunity to see Ms. Burns Spain, [place] [phone] 10:21 AM

## 2018-08-21 NOTE — Progress Notes (Signed)
PROGRESS NOTE    [name]   IDP:[id number]  DOB: 1961-02-21  DOA: 08/13/2018 PCP: Azzie Glatter, FNP   Brief Narrative:  [name] 57 y.o.femalewith a hx of Levimisole vasculitis, RA, anemia, migraine, HTN, depression, substance abuse, cocaine use who presented for B LE pain, draiange Patient has had a long history of levamisole induced vasculitis and has had AKA bilaterally and finger amputations. 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  In the ED she was found to have an elevated WBC, lactic acidosis, temperature of 100.3 and there was concern for sepsis. She last smoked crack 1 day prior, and is a daily tobacco smoker.   This is the 7th admission this year.   Subjective: No complaints today     Assessment & Plan:   Principal Problem:   Severe Sepsis - WBC 17.2, temp 100.3, HR > 100 with lactic acidosis and liver failure   S/P bilateral below knee amputation  - suspected to be due to chronic wounds which are now infected- fould odor and drainage in triage - currently on Vanc and Cefepime (since admission)- - WBC improving- change to Doxy and Augmentin - cultures neg - MRI neg for osteomyelitis - follows with Dr Sharol Given- - I have spoken with him and he has evaluated the patient today- he does not feel revision surgery will be helpful for her  Active Problems:   Acute kidney injury/ prerenal - resolved with hydration    Acute respiratory failure- acute on chronic d CHF- grade 2 - possibly fluid overloaded-  cont IV Lasix but increase frequency - urinating well today - she also has a cough with congestion - CXR > diffuse infiltrates- infection vs pulm edema   - admitted for exacerbation in Sept of this year because she ran out of medications - on Norvasc and Lasix - Cozaar on hold- CXR > pulm edema- cont to diurese with higher dose of Lasix- still hypoxic on room air and fluid overloaded  Coagulopathy- acquired  INR 3.06 on  11/10 but not previously this elevated - repeated > 1.51 which is her baseline  Levamisol induced vasculitis    Protein-calorie malnutrition, severe  Malnutrition Type: Nutrition Problem: Moderate Malnutrition Etiology: social / environmental circumstances(limited access to food) Malnutrition Characteristics: Signs/Symptoms: energy intake < or equal to 75% for > or equal to 1 month, moderate fat depletion, mild muscle depletion Nutrition Interventions: Interventions: Nepro shake, MVI    Elevated LFTs- thrombocytopenia - has h/o hepatomegaly  - elevated LFTs may be due to sepsis vs cocaine induced injury - now improving  Hypokalemia - replaced  Homeless  Tobacco abuse - cont Nicotine patch    DVT prophylaxis:  Lovenox Code Status: DNR Family Communication:  Disposition Plan: PT recommends SNF- consult social work Consultants:  - called ortho Procedures:    Antimicrobials:  Anti-infectives (From admission, onward)   Start     Dose/Rate Route Frequency Ordered Stop   08/20/18 2200  doxycycline (VIBRA-TABS) tablet 100 mg     100 mg Oral Every 12 hours 08/20/18 1548     08/20/18 2200  amoxicillin-clavulanate (AUGMENTIN) 875-125 MG per tablet 1 tablet     1 tablet Oral Every 12 hours 08/20/18 1548     08/20/18 2030  vancomycin (VANCOCIN) IVPB 750 mg/150 ml premix  Status:  Discontinued     750 mg 150 mL/hr over 60 Minutes Intravenous Every 12 hours 08/20/18 0855 08/21/18 1416   08/20/18 1000  ceFEPIme (MAXIPIME) 1 g in sodium chloride 0.9 % 100 mL IVPB  Status:  Discontinued     1 g 200 mL/hr over 30 Minutes Intravenous Every 12 hours 08/20/18 0839 08/20/18 1548   08/15/18 1000  ceFEPIme (MAXIPIME) 1 g in sodium chloride 0.9 % 100 mL IVPB  Status:  Discontinued     1 g 200 mL/hr over 30 Minutes Intravenous Every 24 hours 08/14/18 1020 08/20/18 0839   08/14/18 0600  vancomycin (VANCOCIN) IVPB 1000 mg/200 mL premix  Status:  Discontinued     1,000 mg 200 mL/hr over 60  Minutes Intravenous Every 24 hours 08/13/18 0646 08/20/18 0855   08/13/18 2200  ceFEPIme (MAXIPIME) 1 g in sodium chloride 0.9 % 100 mL IVPB  Status:  Discontinued     1 g 200 mL/hr over 30 Minutes Intravenous Every 12 hours 08/13/18 0646 08/14/18 1020   08/13/18 0415  ceFEPIme (MAXIPIME) 2 g in sodium chloride 0.9 % 100 mL IVPB     2 g 200 mL/hr over 30 Minutes Intravenous  Once 08/13/18 0414 08/13/18 0451   08/13/18 0415  metroNIDAZOLE (FLAGYL) IVPB 500 mg  Status:  Discontinued     500 mg 100 mL/hr over 60 Minutes Intravenous Every 8 hours 08/13/18 0414 08/13/18 0734   08/13/18 0415  vancomycin (VANCOCIN) IVPB 1000 mg/200 mL premix     1,000 mg 200 mL/hr over 60 Minutes Intravenous  Once 08/13/18 0414 08/13/18 0648       Objective: Vitals:   08/20/18 1520 08/20/18 1935 08/21/18 0416 08/21/18 1220  BP: 133/81 122/69 (!) 144/84 126/65  Pulse: 96 96  93  Resp: (!) 23 (!) 30 18 17   Temp: 98 F (36.7 C) 98.7 F (37.1 C) 97.8 F (36.6 C) 97.9 F (36.6 C)  TempSrc:  Oral Oral Oral  SpO2: 93% 93% 94% 100%  Weight:      Height:        Intake/Output Summary (Last 24 hours) at 08/21/2018 1428 Last data filed at 08/21/2018 1307 Gross per 24 hour  Intake 990 ml  Output 2400 ml  Net -1410 ml   Filed Weights   08/18/18 1550  Weight: 47 kg    Examination: General exam: Appears comfortable  HEENT: PERRLA, oral mucosa moist, no sclera icterus or thrush Respiratory system: Clear to auscultation. Respiratory effort normal. Cardiovascular system: S1 & S2 heard,  No murmurs  Gastrointestinal system: Abdomen soft, non-tender, nondistended. Normal bowel sound. No organomegaly Central nervous system: Alert and oriented. No focal neurological deficits. Extremities: No cyanosis, clubbing or edema Skin: No rashes or ulcers Psychiatry:  flat affect           Data Reviewed: I have personally reviewed following labs and imaging studies  CBC: Recent Labs  Lab 08/16/18 0335  08/17/18 0332 08/18/18 0416 08/20/18 0639 08/21/18 0345  WBC 14.0* 13.4* 15.0* 11.5* 12.8*  HGB 10.1* 10.4* 9.2* 10.4* 10.0*  HCT 34.5* 34.5* 31.7* 33.9* 33.5*  MCV 86.5 85.6 86.4 84.1 83.3  PLT 92* 71* 106* 153 109   Basic Metabolic Panel: Recent Labs  Lab 08/16/18 0335 08/17/18 0332 08/18/18 0416 08/20/18 0639 08/21/18 0345  NA 134* 134* 136 137 137  K 4.4 4.3 3.8 3.4* 4.3  CL 110 110 109 107 105  CO2 16* 17* 17* 23 23  GLUCOSE 80 92 95 78 92  BUN 27* 21* 15 10 13   CREATININE 0.88 0.69 0.72 0.79 0.89  CALCIUM 8.1* 8.1* 8.0* 8.0* 8.7*  MG 2.0 1.8  --   --   --  PHOS 1.8* 1.5* 2.8  --   --    GFR: Estimated Creatinine Clearance: 53 mL/min (by C-G formula based on SCr of 0.89 mg/dL). Liver Function Tests: Recent Labs  Lab 08/15/18 0356 08/16/18 0335 08/17/18 0332 08/18/18 0416 08/20/18 0639  AST 941* 616* 287* 137* 51*  ALT 670* 610* 457* 320* 173*  ALKPHOS 103 103 98 95 90  BILITOT 4.0* 4.3* 4.0* 3.7* 4.5*  PROT 5.8* 6.1* 6.0* 5.8* 5.4*  ALBUMIN 2.6* 2.6* 2.5* 2.3* 2.1*   No results for input(s): LIPASE, AMYLASE in the last 168 hours. Recent Labs  Lab 08/16/18 1655 08/17/18 0332  AMMONIA 32 30   Coagulation Profile: Recent Labs  Lab 08/20/18 0938  INR 1.51   Cardiac Enzymes: No results for input(s): CKTOTAL, CKMB, CKMBINDEX, TROPONINI in the last 168 hours. BNP (last 3 results) No results for input(s): PROBNP in the last 8760 hours. HbA1C: No results for input(s): HGBA1C in the last 72 hours. CBG: No results for input(s): GLUCAP in the last 168 hours. Lipid Profile: No results for input(s): CHOL, HDL, LDLCALC, TRIG, CHOLHDL, LDLDIRECT in the last 72 hours. Thyroid Function Tests: No results for input(s): TSH, T4TOTAL, FREET4, T3FREE, THYROIDAB in the last 72 hours. Anemia Panel: No results for input(s): VITAMINB12, FOLATE, FERRITIN, TIBC, IRON, RETICCTPCT in the last 72 hours. Urine analysis:    Component Value Date/Time   COLORURINE AMBER  (A) 08/13/2018 0552   APPEARANCEUR CLEAR 08/13/2018 0552   LABSPEC 1.017 08/13/2018 0552   PHURINE 5.0 08/13/2018 0552   GLUCOSEU NEGATIVE 08/13/2018 0552   HGBUR MODERATE (A) 08/13/2018 0552   BILIRUBINUR NEGATIVE 08/13/2018 0552   BILIRUBINUR small 04/29/2015 1533   KETONESUR 5 (A) 08/13/2018 0552   PROTEINUR 100 (A) 08/13/2018 0552   UROBILINOGEN 1.0 07/26/2017 1550   NITRITE NEGATIVE 08/13/2018 0552   LEUKOCYTESUR NEGATIVE 08/13/2018 0552   Sepsis Labs: @LABRCNTIP (procalcitonin:4,lacticidven:4) ) Recent Results (from the past 240 hour(s))  Blood culture (routine x 2)     Status: None   Collection Time: 08/13/18  3:35 AM  Result Value Ref Range Status   Specimen Description BLOOD SITE NOT SPECIFIED  Final   Special Requests   Final    BOTTLES DRAWN AEROBIC ONLY Blood Culture adequate volume   Culture   Final    NO GROWTH 5 DAYS Performed at [facility], [place] [address], [place], [place] [address]    Report Status 08/18/2018 FINAL  Final  Blood culture (routine x 2)     Status: None   Collection Time: 08/13/18  4:15 AM  Result Value Ref Range Status   Specimen Description BLOOD RIGHT ELBOW  Final   Special Requests   Final    BOTTLES DRAWN AEROBIC AND ANAEROBIC Blood Culture adequate volume   Culture   Final    NO GROWTH 5 DAYS Performed at [facility], [place] [address] [place], [place] [address]    Report Status 08/18/2018 FINAL  Final         Radiology Studies: Dg Chest Port 1 View  Result Date: 08/20/2018 CLINICAL DATA:  Hypoxia EXAM: PORTABLE CHEST 1 VIEW COMPARISON:  08/15/2018 FINDINGS: 1501 hours. Interval development of diffuse micro nodular opacity in both lungs with an upper lobe predominance. Cardiopericardial silhouette is enlarged. No substantial pleural effusion. Nodular opacity overlying the right base is similar to a nodular opacity along the left chest wall, likely representing nipple shadows. The visualized bony structures of the  thorax are intact. Telemetry leads  overlie the chest. IMPRESSION: Cardiomegaly with diffuse interstitial and micro nodular opacity with an upper lung predominance. Imaging features are compatible with diffuse infection or pulmonary edema. Electronically Signed   By: Misty Stanley M.D.   On: 08/20/2018 15:19      Scheduled Meds: . amLODipine  10 mg Oral Daily  . amoxicillin-clavulanate  1 tablet Oral Q12H  . doxycycline  100 mg Oral Q12H  . enoxaparin (LOVENOX) injection  30 mg Subcutaneous Q24H  . feeding supplement (ENSURE ENLIVE)  237 mL Oral BID BM  . furosemide  20 mg Intravenous BID  . polyethylene glycol  17 g Oral Daily   Continuous Infusions:    LOS: 8 days    Time spent in minutes: 35    Debbe Odea, MD [facility] Pager: [email or web address] Password [email or web address] 08/21/2018, 2:28 PM

## 2018-08-22 LAB — BASIC METABOLIC PANEL
ANION GAP: 8 (ref 5–15)
BUN: 12 mg/dL (ref 6–20)
CHLORIDE: 101 mmol/L (ref 98–111)
CO2: 26 mmol/L (ref 22–32)
Calcium: 8.6 mg/dL — ABNORMAL LOW (ref 8.9–10.3)
Creatinine, Ser: 0.7 mg/dL (ref 0.44–1.00)
GFR calc Af Amer: >60 mL/min (ref >60)
GLUCOSE: 85 mg/dL (ref 70–99)
POTASSIUM: 3.5 mmol/L (ref 3.5–5.1)
Sodium: 135 mmol/L (ref 135–145)

## 2018-08-22 LAB — CBC
HCT: 34 % — ABNORMAL LOW (ref 36.0–46.0)
Hemoglobin: 10.1 g/dL — ABNORMAL LOW (ref 12.0–15.0)
MCH: 24.7 pg — AB (ref 26.0–34.0)
MCHC: 29.7 g/dL — ABNORMAL LOW (ref 30.0–36.0)
MCV: 83.1 fL (ref 80.0–100.0)
PLATELETS: 232 10*3/uL (ref 150–400)
RBC: 4.09 MIL/uL (ref 3.87–5.11)
RDW: 19.7 % — AB (ref 11.5–15.5)
WBC: 11.6 10*3/uL — ABNORMAL HIGH (ref 4.0–10.5)
nRBC: 0 % (ref 0.0–0.2)

## 2018-08-22 MED ORDER — POTASSIUM CHLORIDE CRYS ER 20 MEQ PO TBCR
40.0000 meq | EXTENDED_RELEASE_TABLET | ORAL | Status: AC
  Administered 2018-08-22 (×2): 40 meq via ORAL
  Filled 2018-08-22: qty 2

## 2018-08-22 MED ORDER — DOXYCYCLINE HYCLATE 100 MG PO TABS: 100.0000 mg | ORAL_TABLET | Freq: Two times a day (BID) | ORAL | Status: DC

## 2018-08-22 MED ORDER — POLYETHYLENE GLYCOL 3350 17 G PO PACK: 17.0000 g | PACK | Freq: Every day | ORAL | 0 refills | Status: DC

## 2018-08-22 MED ORDER — OXYCODONE HCL 5 MG PO TABS: 5.0000 mg | ORAL_TABLET | ORAL | 0 refills | Status: DC | PRN

## 2018-08-22 MED ORDER — ACETAMINOPHEN 325 MG PO TABS: 650.0000 mg | ORAL_TABLET | Freq: Four times a day (QID) | ORAL | Status: DC | PRN

## 2018-08-22 MED ORDER — AMOXICILLIN-POT CLAVULANATE 875-125 MG PO TABS: 1.0000 | ORAL_TABLET | Freq: Two times a day (BID) | ORAL | Status: DC

## 2018-08-22 MED ORDER — POTASSIUM CHLORIDE ER 10 MEQ PO TBCR: 40.0000 meq | EXTENDED_RELEASE_TABLET | Freq: Every day | ORAL | Status: DC

## 2018-08-22 MED ORDER — TRAMADOL HCL 50 MG PO TABS: 50.0000 mg | ORAL_TABLET | Freq: Four times a day (QID) | ORAL | 0 refills | Status: DC | PRN

## 2018-08-22 MED ORDER — TRAMADOL HCL 50 MG PO TABS: 100.0000 mg | ORAL_TABLET | Freq: Four times a day (QID) | ORAL | 0 refills | Status: DC

## 2018-08-22 MED ORDER — ENSURE ENLIVE PO LIQD: 237.0000 mL | Freq: Two times a day (BID) | ORAL | 12 refills | Status: DC

## 2018-08-22 NOTE — Progress Notes (Signed)
Palliative:  Ms. Cristina, [name], is resting quietly in bed.  She makes and mostly keeps eye contact as I enter.  She is calm and cooperative, but tearful.  There is no family at bedside at this time.  We talked about possible discharge today, disposition.  I share that due to her continued substance use, it is unlikely that she will be accepted in any SNF.  I share that we will try, and ask if it would be okay if she were at a distant facility.  She tells me that she will go wherever she needs to go for SNF.  We talked about what if she is not accepted to SNF.  We talked about returning to her brother [name]'s home.  [name] is tearful, stating that she does not want to go back to [name]'s, she shares that he has put her out in the past.  I ask if she does not go to [place], who is home when she go to.  She is unable to name any person who could assist with discharge, accommodate her in their home.   We talked about her chronic nonhealing wounds.  We talked about further surgery would not be helpful.  [name] agrees stating that she would not take surgery even if offered.  I share my concern over her ability to care for herself, who will help with wound care.  We talked about special medical care, focusing on comfort and dignity, but no antibiotics, inpatient hospice.  At this point, [name] is not interested in inpatient hospice.  We also talked about CODE STATUS, the realities of CPR for person with her chronic and acute medical conditions.  During our discussion of CODE STATUS, she closes her eyes and does not talk with me.  Psychosocial/emotional support provided with this unfortunate patient.  Conference with CM Kristi related to [place] discussion and disposition.  Consultation with attending related to goals of care discussion, disposition.  50 minutes, extended time Quinn Axe, NP Palliative Medicine Team [phone]

## 2018-08-22 NOTE — Progress Notes (Signed)
PROGRESS NOTE    [name]   TZG:[id number]  DOB: January 31, 1961  DOA: 08/13/2018 PCP: Azzie Glatter, FNP   This patient is stable for d/c. Awaiting SNF bed.   Brief Narrative:  [name] 57 y.o.femalewith a hx of Levimisole vasculitis, RA, anemia, migraine, HTN, depression, substance abuse, cocaine use who presented for B LE pain, draiange Patient has had a long history of levamisole induced vasculitis and has had AKA bilaterally and finger amputations. 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  In the ED she was found to have an elevated WBC, lactic acidosis, temperature of 100.3 and there was concern for sepsis. She last smoked crack 1 day prior, and is a daily tobacco smoker.   This is the 7th admission this year.   Subjective: She has no complaints today     Assessment & Plan:   Principal Problem:   Severe Sepsis - WBC 17.2, temp 100.3, HR > 100 with lactic acidosis and liver failure   S/P bilateral below knee amputation  - suspected to be due to chronic wounds which are now infected- fould odor and drainage in triage - currently on Vanc and Cefepime (since admission)- - WBC improving- change to Doxy and Augmentin - cultures neg - MRI neg for osteomyelitis - follows with Dr Sharol Given- - I have spoken with him and he has evaluated the patient - he does not feel revision surgery will be helpful for her  Active Problems:   Acute kidney injury/ prerenal - resolved with hydration    Acute respiratory failure- acute on chronic d CHF- grade 2 - possibly fluid overloaded-  cont IV Lasix but increase frequency - urinating well today - she also has a cough with congestion - CXR > diffuse infiltrates- infection vs pulm edema   - admitted for exacerbation in Sept of this year because she ran out of medications - on Norvasc and Lasix - Cozaar on hold- CXR > pulm edema- cont to diurese with higher dose of Lasix- still hypoxic on room air and  fluid overloaded - 11/18- now off of O2-  pulse ox in low 90s  Coagulopathy- acquired  INR 3.06 on 11/10 but not previously this elevated - repeated > 1.51 which is her baseline  Levamisol induced vasculitis    Protein-calorie malnutrition, severe  Malnutrition Type: Nutrition Problem: Moderate Malnutrition Etiology: social / environmental circumstances(limited access to food) Malnutrition Characteristics: Signs/Symptoms: energy intake < or equal to 75% for > or equal to 1 month, moderate fat depletion, mild muscle depletion Nutrition Interventions: Interventions: Nepro shake, MVI    Elevated LFTs- thrombocytopenia - has h/o hepatomegaly  - elevated LFTs may be due to sepsis vs cocaine induced injury - now improving  Hypokalemia - replaced  Homeless  Tobacco abuse - cont Nicotine patch    DVT prophylaxis:  Lovenox Code Status: DNR Family Communication:  Disposition Plan: PT recommends SNF- consult social work Consultants:  - called ortho Procedures:    Antimicrobials:  Anti-infectives (From admission, onward)   Start     Dose/Rate Route Frequency Ordered Stop   08/22/18 0000  amoxicillin-clavulanate (AUGMENTIN) 875-125 MG tablet     1 tablet Oral Every 12 hours 08/22/18 0840     08/22/18 0000  doxycycline (VIBRA-TABS) 100 MG tablet     100 mg Oral Every 12 hours 08/22/18 0840     08/20/18 2200  doxycycline (VIBRA-TABS) tablet 100 mg     100 mg Oral Every  12 hours 08/20/18 1548     08/20/18 2200  amoxicillin-clavulanate (AUGMENTIN) 875-125 MG per tablet 1 tablet     1 tablet Oral Every 12 hours 08/20/18 1548     08/20/18 2030  vancomycin (VANCOCIN) IVPB 750 mg/150 ml premix  Status:  Discontinued     750 mg 150 mL/hr over 60 Minutes Intravenous Every 12 hours 08/20/18 0855 08/21/18 1416   08/20/18 1000  ceFEPIme (MAXIPIME) 1 g in sodium chloride 0.9 % 100 mL IVPB  Status:  Discontinued     1 g 200 mL/hr over 30 Minutes Intravenous Every 12 hours 08/20/18 0839  08/20/18 1548   08/15/18 1000  ceFEPIme (MAXIPIME) 1 g in sodium chloride 0.9 % 100 mL IVPB  Status:  Discontinued     1 g 200 mL/hr over 30 Minutes Intravenous Every 24 hours 08/14/18 1020 08/20/18 0839   08/14/18 0600  vancomycin (VANCOCIN) IVPB 1000 mg/200 mL premix  Status:  Discontinued     1,000 mg 200 mL/hr over 60 Minutes Intravenous Every 24 hours 08/13/18 0646 08/20/18 0855   08/13/18 2200  ceFEPIme (MAXIPIME) 1 g in sodium chloride 0.9 % 100 mL IVPB  Status:  Discontinued     1 g 200 mL/hr over 30 Minutes Intravenous Every 12 hours 08/13/18 0646 08/14/18 1020   08/13/18 0415  ceFEPIme (MAXIPIME) 2 g in sodium chloride 0.9 % 100 mL IVPB     2 g 200 mL/hr over 30 Minutes Intravenous  Once 08/13/18 0414 08/13/18 0451   08/13/18 0415  metroNIDAZOLE (FLAGYL) IVPB 500 mg  Status:  Discontinued     500 mg 100 mL/hr over 60 Minutes Intravenous Every 8 hours 08/13/18 0414 08/13/18 0734   08/13/18 0415  vancomycin (VANCOCIN) IVPB 1000 mg/200 mL premix     1,000 mg 200 mL/hr over 60 Minutes Intravenous  Once 08/13/18 0414 08/13/18 0648       Objective: Vitals:   08/21/18 1220 08/21/18 1930 08/22/18 0314 08/22/18 1217  BP: 126/65 (!) 118/92 (!) 142/82 126/83  Pulse: 93 98 96 95  Resp: 17 (!) 25 17 (!) 24  Temp: 97.9 F (36.6 C) 98.4 F (36.9 C) 98 F (36.7 C) 98.2 F (36.8 C)  TempSrc: Oral Oral Oral Oral  SpO2: 100% 91% 90% 95%  Weight:      Height:        Intake/Output Summary (Last 24 hours) at 08/22/2018 1403 Last data filed at 08/22/2018 0902 Gross per 24 hour  Intake 440 ml  Output 1500 ml  Net -1060 ml   Filed Weights   08/18/18 1550  Weight: 47 kg    Examination: General exam: Appears comfortable  HEENT: PERRLA, oral mucosa moist, no sclera icterus or thrush Respiratory system: Clear to auscultation. Respiratory effort normal. Cardiovascular system: S1 & S2 heard,  No murmurs  Gastrointestinal system: Abdomen soft, non-tender, nondistended. Normal bowel  sound. No organomegaly Central nervous system: Alert and oriented. No focal neurological deficits. Extremities: No cyanosis, clubbing or edema Skin: No rashes or ulcers Psychiatry:  Mood & affect appropriate.           Data Reviewed: I have personally reviewed following labs and imaging studies  CBC: Recent Labs  Lab 08/17/18 0332 08/18/18 0416 08/20/18 0639 08/21/18 0345 08/22/18 0339  WBC 13.4* 15.0* 11.5* 12.8* 11.6*  HGB 10.4* 9.2* 10.4* 10.0* 10.1*  HCT 34.5* 31.7* 33.9* 33.5* 34.0*  MCV 85.6 86.4 84.1 83.3 83.1  PLT 71* 106* 153 216 232   Basic  Metabolic Panel: Recent Labs  Lab 08/16/18 0335 08/17/18 0332 08/18/18 0416 08/20/18 0639 08/21/18 0345 08/22/18 0339  NA 134* 134* 136 137 137 135  K 4.4 4.3 3.8 3.4* 4.3 3.5  CL 110 110 109 107 105 101  CO2 16* 17* 17* 23 23 26   GLUCOSE 80 92 95 78 92 85  BUN 27* 21* 15 10 13 12   CREATININE 0.88 0.69 0.72 0.79 0.89 0.70  CALCIUM 8.1* 8.1* 8.0* 8.0* 8.7* 8.6*  MG 2.0 1.8  --   --   --   --   PHOS 1.8* 1.5* 2.8  --   --   --    GFR: Estimated Creatinine Clearance: 59 mL/min (by C-G formula based on SCr of 0.7 mg/dL). Liver Function Tests: Recent Labs  Lab 08/16/18 0335 08/17/18 0332 08/18/18 0416 08/20/18 0639  AST 616* 287* 137* 51*  ALT 610* 457* 320* 173*  ALKPHOS 103 98 95 90  BILITOT 4.3* 4.0* 3.7* 4.5*  PROT 6.1* 6.0* 5.8* 5.4*  ALBUMIN 2.6* 2.5* 2.3* 2.1*   No results for input(s): LIPASE, AMYLASE in the last 168 hours. Recent Labs  Lab 08/16/18 1655 08/17/18 0332  AMMONIA 32 30   Coagulation Profile: Recent Labs  Lab 08/20/18 0938  INR 1.51   Cardiac Enzymes: No results for input(s): CKTOTAL, CKMB, CKMBINDEX, TROPONINI in the last 168 hours. BNP (last 3 results) No results for input(s): PROBNP in the last 8760 hours. HbA1C: No results for input(s): HGBA1C in the last 72 hours. CBG: No results for input(s): GLUCAP in the last 168 hours. Lipid Profile: No results for input(s):  CHOL, HDL, LDLCALC, TRIG, CHOLHDL, LDLDIRECT in the last 72 hours. Thyroid Function Tests: No results for input(s): TSH, T4TOTAL, FREET4, T3FREE, THYROIDAB in the last 72 hours. Anemia Panel: No results for input(s): VITAMINB12, FOLATE, FERRITIN, TIBC, IRON, RETICCTPCT in the last 72 hours. Urine analysis:    Component Value Date/Time   COLORURINE AMBER (A) 08/13/2018 0552   APPEARANCEUR CLEAR 08/13/2018 0552   LABSPEC 1.017 08/13/2018 0552   PHURINE 5.0 08/13/2018 0552   GLUCOSEU NEGATIVE 08/13/2018 0552   HGBUR MODERATE (A) 08/13/2018 0552   BILIRUBINUR NEGATIVE 08/13/2018 0552   BILIRUBINUR small 04/29/2015 1533   KETONESUR 5 (A) 08/13/2018 0552   PROTEINUR 100 (A) 08/13/2018 0552   UROBILINOGEN 1.0 07/26/2017 1550   NITRITE NEGATIVE 08/13/2018 0552   LEUKOCYTESUR NEGATIVE 08/13/2018 0552   Sepsis Labs: @LABRCNTIP (procalcitonin:4,lacticidven:4) ) Recent Results (from the past 240 hour(s))  Blood culture (routine x 2)     Status: None   Collection Time: 08/13/18  3:35 AM  Result Value Ref Range Status   Specimen Description BLOOD SITE NOT SPECIFIED  Final   Special Requests   Final    BOTTLES DRAWN AEROBIC ONLY Blood Culture adequate volume   Culture   Final    NO GROWTH 5 DAYS Performed at [facility], [place] [address] [place], [place] [address]    Report Status 08/18/2018 FINAL  Final  Blood culture (routine x 2)     Status: None   Collection Time: 08/13/18  4:15 AM  Result Value Ref Range Status   Specimen Description BLOOD RIGHT ELBOW  Final   Special Requests   Final    BOTTLES DRAWN AEROBIC AND ANAEROBIC Blood Culture adequate volume   Culture   Final    NO GROWTH 5 DAYS Performed at [facility], [place] [address] [place], [place] [address]    Report Status  08/18/2018 FINAL  Final         Radiology Studies: Dg Chest Port 1 View  Result Date: 08/20/2018 CLINICAL DATA:  Hypoxia EXAM: PORTABLE CHEST 1 VIEW COMPARISON:  08/15/2018 FINDINGS:  1501 hours. Interval development of diffuse micro nodular opacity in both lungs with an upper lobe predominance. Cardiopericardial silhouette is enlarged. No substantial pleural effusion. Nodular opacity overlying the right base is similar to a nodular opacity along the left chest wall, likely representing nipple shadows. The visualized bony structures of the thorax are intact. Telemetry leads overlie the chest. IMPRESSION: Cardiomegaly with diffuse interstitial and micro nodular opacity with an upper lung predominance. Imaging features are compatible with diffuse infection or pulmonary edema. Electronically Signed   By: Misty Stanley M.D.   On: 08/20/2018 15:19      Scheduled Meds: . amLODipine  10 mg Oral Daily  . amoxicillin-clavulanate  1 tablet Oral Q12H  . doxycycline  100 mg Oral Q12H  . enoxaparin (LOVENOX) injection  30 mg Subcutaneous Q24H  . feeding supplement (ENSURE ENLIVE)  237 mL Oral BID BM  . furosemide  20 mg Intravenous BID  . polyethylene glycol  17 g Oral Daily  . potassium chloride  40 mEq Oral Q4H   Continuous Infusions:    LOS: 9 days    Time spent in minutes: 35    Debbe Odea, MD [facility] Pager: [email or web address] Password [email or web address] 08/22/2018, 2:03 PM
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
## 2018-08-22 NOTE — Discharge Summary (Signed)
Physician Discharge Summary  [name]:[id number] DOB: 1961/07/04 DOA: 08/13/2018  PCP: Azzie Glatter, FNP  Admit date: 08/13/2018 Discharge date: 08/22/2018  Admitted From: home Disposition:  SNF   Recommendations for Outpatient Follow-up:  1. Would care for ulcers 2. Palliative care to follow at SNF 3. Bmet in 3 days 4. 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 due to fluid overload   Thrombocytopenia (HCC)   Essential hypertension   Cocaine-induced vascular disorder (HCC)   Protein-calorie malnutrition, severe (HCC)   Atherosclerosis of native arteries of extremities   Tobacco abuse   Homeless   Elevated LFTs   Goals of care, counseling/discussion   Palliative care by specialist   DNR (do not resuscitate)           Brief Summary: [name] 57 y.o.femalewith a hx of Levimisole induced vasculitis, PVD with b/l BKA anemia, migraine, HTN, depression, substance abuse, cocaine use who presented for B LE pain, draiange Patient has had a long history of levamisole induced vasculitis and has had AKA bilaterally and finger amputations. 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  In the ED she was found to have an elevated WBC, lactic acidosis, temperature of 100.3 and there was concern for sepsis. She last smoked crack 1 day prior, and is a daily tobacco smoker.   This is the 7th admission in 1 year.   Hospital Course:  Severe Sepsis - WBC 17.2, temp 100.3, HR > 100 with lactic acidosis and liver failure   S/P bilateral below knee amputation  - suspected to be due to chronic wounds which are now infected- fould odor  and drainage in triage - currently on Vanc and Cefepime (since admission)- - WBC improving- changed to Doxy and Augmentin- d/c today, 11/19 - cultures neg - MRI neg for osteomyelitis - follows with Dr Sharol Given- - I have spoken with him and he evaluated the patient 11/17- he does not feel revision surgery will be helpful for her  Active Problems:   Acute kidney injury/ prerenal - resolved with hydration    Acute respiratory failure- acute on chronic d CHF- grade 2 - possibly fluid overloaded-  cont IV Lasix but increase frequency - urinating well today - she also has a cough with congestion - CXR > diffuse infiltrates- infection vs pulm edema   - admitted for exacerbation in Sept of this year because she ran out of medications - on Norvasc and Lasix - Cozaar on hold- CXR > pulm edema- diuresed with IV Lasix   Coagulopathy- acquired  INR 3.06 on 11/10 but not previously this elevated - repeated > 1.51 which is her baseline  Levamisol induced vasculitis     Elevated LFTs- thrombocytopenia - has h/o hepatomegaly  - elevated LFTs may be due to sepsis vs cocaine induced injury - now improving  Hypokalemia - replaced     Protein-calorie malnutrition, severe  Malnutrition Type: Nutrition Problem: Moderate Malnutrition Etiology: social / environmental circumstances(limited access to food) Malnutrition Characteristics: Signs/Symptoms: energy intake < or equal to 75% for > or equal to 1 month, moderate fat depletion, mild muscle  depletion Nutrition Interventions: Interventions: Nepro shake, MVI  Homeless  Tobacco abuse - cont Nicotine patch      Discharge Exam: Vitals:   08/21/18 1930 08/22/18 0314  BP: (!) 118/92 (!) 142/82  Pulse: 98 96  Resp: (!) 25 17  Temp: 98.4 F (36.9 C) 98 F (36.7 C)  SpO2: 91% 90%   Vitals:   08/21/18 0416 08/21/18 1220 08/21/18 1930 08/22/18 0314  BP: (!) 144/84 126/65 (!) 118/92 (!) 142/82  Pulse:  93 98 96  Resp: 18 17 (!)  25 17  Temp: 97.8 F (36.6 C) 97.9 F (36.6 C) 98.4 F (36.9 C) 98 F (36.7 C)  TempSrc: Oral Oral Oral Oral  SpO2: 94% 100% 91% 90%  Weight:      Height:        General: Pt is alert, awake, not in acute distress Cardiovascular: RRR, S1/S2 +, no rubs, no gallops Respiratory: CTA bilaterally, no wheezing, no rhonchi Abdominal: Soft, NT, ND, bowel sounds + Extremities: no edema, no cyanosis- b/l BKA with significant ulceration and necrosis bilaterally   Discharge Instructions  Discharge Instructions    Diet - low sodium heart healthy   Complete by:  As directed    Increase activity slowly   Complete by:  As directed      Allergies as of 08/22/2018      Reactions   Acetaminophen Swelling, Other (See Comments)   Reaction:  Eyelid swelling   Haldol [haloperidol Lactate]    Possible eye swelling was given together with Toradol unsure if caused the reaction   Lisinopril    UNSPECIFIED REACTION    Toradol [ketorolac Tromethamine]    Eye swelling      Medication List    STOP taking these medications   ondansetron 4 MG tablet Commonly known as:  ZOFRAN     TAKE these medications   acetaminophen 325 MG tablet Commonly known as:  TYLENOL Take 2 tablets (650 mg total) by mouth every 6 (six) hours as needed for mild pain, fever or headache.   amLODipine 10 MG tablet Commonly known as:  NORVASC Take 1 tablet (10 mg total) by mouth daily.   amoxicillin-clavulanate 875-125 MG tablet Commonly known as:  AUGMENTIN Take 1 tablet by mouth every 12 (twelve) hours.   doxycycline 100 MG tablet Commonly known as:  VIBRA-TABS Take 1 tablet (100 mg total) by mouth every 12 (twelve) hours.   feeding supplement (ENSURE ENLIVE) Liqd Take 237 mLs by mouth 2 (two) times daily between meals.   furosemide 40 MG tablet Commonly known as:  LASIX Take 1 tablet (40 mg total) by mouth daily.   gabapentin 100 MG capsule Commonly known as:  NEURONTIN Take 1 capsule (100 mg total) by  mouth 3 (three) times daily.   losartan 25 MG tablet Commonly known as:  COZAAR Take 1 tablet (25 mg total) by mouth daily.   ondansetron 4 MG disintegrating tablet Commonly known as:  ZOFRAN-ODT Take 1 tablet (4 mg total) by mouth every 8 (eight) hours as needed for nausea or vomiting.   oxyCODONE 5 MG immediate release tablet Commonly known as:  Oxy IR/ROXICODONE Take 1 tablet (5 mg total) by mouth every 4 (four) hours as needed for severe pain.   pantoprazole 40 MG tablet Commonly known as:  PROTONIX Take 1 tablet (40 mg total) by mouth daily.   polyethylene glycol packet Commonly known as:  MIRALAX / GLYCOLAX Take 17 g by mouth daily.   potassium chloride 10  MEQ tablet Commonly known as:  K-DUR Take 4 tablets (40 mEq total) by mouth daily.   traMADol 50 MG tablet Commonly known as:  ULTRAM Take 2 tablets (100 mg total) by mouth 4 (four) times daily.       Allergies  Allergen Reactions  . Acetaminophen Swelling and Other (See Comments)    Reaction:  Eyelid swelling  . Haldol [Haloperidol Lactate]     Possible eye swelling was given together with Toradol unsure if caused the reaction  . Lisinopril     UNSPECIFIED REACTION   . Toradol [Ketorolac Tromethamine]     Eye swelling     Procedures/Studies:    X-ray Chest Pa And Lateral  Result Date: 08/13/2018 CLINICAL DATA:  Pt presents from home with [facility] for c/o bilat BKAs with foul odor and drainage; pt with multiple "alevyn" dressings to stumps and hips and sacrum; pt reports she continues to use cocaine. Hx of CAP,HTN. EXAM: CHEST - 2 VIEW COMPARISON:  08/13/2018 at 3:04 a.m. and older exams. FINDINGS: Mild cardiomegaly. No mediastinal or hilar masses. No convincing adenopathy. Central vascular congestion. Prominent bronchovascular markings are noted centrally and in the posteromedial lung bases. Chronic linear scarring in the right upper lobe. Nodular densities described previously are nipple shadows. No  convincing lung nodules. No pleural effusion or pneumothorax. Skeletal structures are intact. IMPRESSION: 1. Cardiomegaly with vascular congestion and central and medial lung base bronchial wall thickening. Findings suggest bronchitis and/or mild interstitial edema. No focal pneumonia. Electronically Signed   By: Lajean Manes M.D.   On: 08/13/2018 08:52   Dg Chest 2 View  Result Date: 07/30/2018 CLINICAL DATA:  Pt BIB EMS found [place]. Pt reports severe pain in both legs. Redness and swelling noted to the sites on both legs. Pt has bilateral AKA amputations. Hx of HTN and community acquired pneumonia. EXAM: CHEST - 2 VIEW COMPARISON:  07/24/2018 and older exams. FINDINGS: Cardiac silhouette is mildly enlarged. No mediastinal or hilar masses. No evidence of adenopathy. Prominent nipple shadow overlies the right mid to lower lung. Mild linear scarring extends laterally from the right hilum, stable. Lungs are otherwise clear. No pleural effusion or pneumothorax. Skeletal structures are intact. IMPRESSION: No acute cardiopulmonary disease. Electronically Signed   By: Lajean Manes M.D.   On: 07/30/2018 18:05   Ct Head Wo Contrast  Result Date: 08/16/2018 CLINICAL DATA:  Altered level of consciousness. History of migraines, hypertension and cocaine abuse. EXAM: CT HEAD WITHOUT CONTRAST TECHNIQUE: Contiguous axial images were obtained from the base of the skull through the vertex without intravenous contrast. COMPARISON:  CT HEAD Feb 16, 2018 FINDINGS: BRAIN: No intraparenchymal hemorrhage, mass effect nor midline shift. Borderline parenchymal brain volume loss for age. No acute large vascular territory infarcts. No abnormal extra-axial fluid collections. Basal cisterns are patent. VASCULAR: Moderate calcific atherosclerosis carotid siphon. SKULL/SOFT TISSUES: No skull fracture. No significant soft tissue swelling. ORBITS/SINUSES: The included ocular globes and orbital contents are  normal.Trace paranasal sinus mucosal thickening. Mastoid air cells are well aerated. OTHER: Patient is edentulous. IMPRESSION: 1. No acute intracranial process. 2. Borderline parenchymal brain volume loss for age. 3. Advanced moderate intracranial atherosclerosis. Electronically Signed   By: Elon Alas M.D.   On: 08/16/2018 22:28   Nm Hepatobiliary Liver Func  Result Date: 08/01/2018 CLINICAL DATA:  57 year old female with generalized abdominal pain. Gallbladder wall slightly thickened on recent ultrasound. Subsequent encounter. EXAM: NUCLEAR MEDICINE HEPATOBILIARY IMAGING TECHNIQUE: Sequential images of  the abdomen were obtained out to 60 minutes following intravenous administration of radiopharmaceutical. RADIOPHARMACEUTICALS:  5.1 mCi Tc-73m Choletec IV COMPARISON:  07/30/2018 ultrasound. FINDINGS: Prompt uptake and biliary excretion of activity by the liver is seen. Gallbladder activity is visualized, consistent with patency of cystic duct. Biliary activity passes into small bowel, consistent with patent common bile duct. IMPRESSION: Gallbladder and small bowel visualized consistent with patent cystic duct and patent common bile duct respectively. Electronically Signed   By: SGenia DelM.D.   On: 08/01/2018 15:25   [place]Abdomen Complete  Result Date: 08/17/2018 CLINICAL DATA:  Transaminitis EXAM: ABDOMEN ULTRASOUND COMPLETE COMPARISON:  Abdominal ultrasound of July 30 2018 FINDINGS: Gallbladder: The gallbladder is adequately distended. Echogenic bile or sludge is present. There no discrete stones. There is no gallbladder wall thickening or positive sonographic [name]'s sign. A small amount of ascites is noted adjacent to the gallbladder. Common bile duct: Diameter: 4 mm Liver: The hepatic echotexture is heterogeneously increased. There is no discrete mass or ductal dilation. A small amount of ascites surrounds the liver. Portal vein is patent on color Doppler imaging with bidirectional  flow. IVC: No abnormality visualized. Pancreas: The pancreatic parenchyma is grossly normal. The duct is dilated to 4 mm. No discrete pancreatic masses are observed. Spleen: The spleen is normal in size and echotexture. Right Kidney: Length: 11.2 cm. Echogenicity within normal limits. No mass or hydronephrosis visualized. Left Kidney: Length: 9.9 cm. Echogenicity within normal limits. No mass or hydronephrosis visualized. Abdominal aorta: There is no aortic aneurysm. There is mural plaque. Other findings: There is ascites as well as bilateral pleural effusions. IMPRESSION: Increased hepatic echotexture without discrete mass. By directional flow in the portal vein consistent with portal hypertension. Mild dilation of the pancreatic duct 4 mm without evidence of pancreatic masses. Gallbladder sludge without discrete stones or sonographic evidence of acute cholecystitis. Electronically Signed   By: David  JMartiniqueM.D.   On: 08/17/2018 09:06   Ct Abdomen Pelvis W Contrast  Result Date: 07/29/2018 CLINICAL DATA:  Abdominal pain, nausea, vomiting and diarrhea beginning this afternoon. History of substance abuse, hernia repair. EXAM: CT ABDOMEN AND PELVIS WITH CONTRAST TECHNIQUE: Multidetector CT imaging of the abdomen and pelvis was performed using the standard protocol following bolus administration of intravenous contrast. CONTRAST:  867mISOVUE-300 IOPAMIDOL (ISOVUE-300) INJECTION 61% COMPARISON:  CT abdomen and pelvis June 21, 2018 FINDINGS: LOWER CHEST: Lung bases are clear. Stable cardiomegaly, reflux contrast consistent with RIGHT heart failure. No pericardial effusion. HEPATOBILIARY: Heterogeneous liver with enlarged IVC with contrast reflux consistent with passive hepatic congestion. Mild hepatomegaly. Normal gallbladder. PANCREAS: Too small to characterize hypodensity in spleen. SPLEEN: Normal. ADRENALS/URINARY TRACT: Kidneys are orthotopic, demonstrating symmetric enhancement. Multifocal scarring  bilateral kidneys. No nephrolithiasis, hydronephrosis or solid renal masses. The unopacified ureters are normal in course and caliber. Delayed imaging through the kidneys demonstrates symmetric prompt contrast excretion within the proximal urinary collecting system. Urinary bladder is partially distended and unremarkable. Normal adrenal glands. STOMACH/BOWEL: The stomach, small and large bowel are normal in course and caliber without inflammatory changes. Mildly thickened: Consistent with ascites. Normal appendix. VASCULAR/LYMPHATIC: Aortoiliac vessels are normal in course and caliber. Moderate calcific atherosclerosis. No lymphadenopathy by CT size criteria. REPRODUCTIVE: Normal. OTHER: Moderate ascites decreased from prior examination. No intraperitoneal free air. MUSCULOSKELETAL: Nonacute. Mild anasarca. Old RIGHT L3 and L4 transverse process fractures. RIGHT hip loose bodies. Moderate to severe LEFT hip osteoarthrosis. IMPRESSION: 1. No acute intra-abdominal/pelvic process. 2. Moderate ascites, decreased from prior examination.  3. Stable hepatomegaly with findings of passive hepatic congestion. Aortic Atherosclerosis (ICD10-I70.0). Electronically Signed   By: Elon Alas M.D.   On: 07/29/2018 00:33   Mr Femur Right W Wo Contrast  Result Date: 08/18/2018 CLINICAL DATA:  57 y.o. female with medical history significant of Levamisole vasculitis, RA, anemia, migraine, HTN, depression, cocaine use who presents for bilateral LE pain. Bilateral AKA. Chronic pain for wounds of the lower extremity at the stumps. EXAM: MR OF THE LEFT LOWER EXTREMITY WITHOUT AND WITH CONTRAST MRI OF THE RIGHT FEMUR WITHOUT AND WITH CONTRAST TECHNIQUE: Multiplanar, multisequence MR imaging of the right lower extremity was performed both before and after administration of intravenous contrast. Multiplanar, multisequence MR imaging of the left lower extremity was performed both before and after administration of intravenous  contrast. CONTRAST:  2 mL Gadavist COMPARISON:  None. FINDINGS: Bones/Joint/Cartilage Bilateral above the knee amputation through the distal femoral diaphysis. No periosteal reaction or bone destruction. No aggressive osseous lesion. No T1 signal abnormality in bilateral femurs. No acute fracture or dislocation. Mild osteoarthritis of bilateral hips. Small bilateral hip joint effusions. Normal alignment. Ligaments, Muscles and Tendons Mild diffuse T2 hyperintense signal throughout the pelvic and femoral musculature without significant enhancement likely neurogenic. No intramuscular drainable fluid collection or hematoma. Soft tissue Diffuse soft tissue edema throughout the subcutaneous fat of the pelvis and bilateral femurs which may be secondary to anasarca or fluid overload versus severe cellulitis. T2 hyperintense well-circumscribed fluid collection adjacent to the pectineus muscle bilaterally without enhancement unchanged compared with prior CT abdomen dated 06/21/2018. No other drainable fluid collection. Moderate pelvic free fluid. IMPRESSION: 1. Bilateral above the knee amputation through the distal femoral diaphysis without evidence of osteomyelitis. 2. Diffuse soft tissue edema throughout the subcutaneous fat of the pelvis and bilateral femurs which may be secondary to anasarca. No drainable fluid collection to suggest an abscess. 3. Mild osteoarthritis of bilateral hips with bilateral hip joint effusions. Electronically Signed   By: Kathreen Devoid   On: 08/18/2018 08:34   Mr Femur Left W Wo Contrast  Result Date: 08/18/2018 CLINICAL DATA:  57 y.o. female with medical history significant of Levamisole vasculitis, RA, anemia, migraine, HTN, depression, cocaine use who presents for bilateral LE pain. Bilateral AKA. Chronic pain for wounds of the lower extremity at the stumps. EXAM: MR OF THE LEFT LOWER EXTREMITY WITHOUT AND WITH CONTRAST MRI OF THE RIGHT FEMUR WITHOUT AND WITH CONTRAST TECHNIQUE:  Multiplanar, multisequence MR imaging of the right lower extremity was performed both before and after administration of intravenous contrast. Multiplanar, multisequence MR imaging of the left lower extremity was performed both before and after administration of intravenous contrast. CONTRAST:  2 mL Gadavist COMPARISON:  None. FINDINGS: Bones/Joint/Cartilage Bilateral above the knee amputation through the distal femoral diaphysis. No periosteal reaction or bone destruction. No aggressive osseous lesion. No T1 signal abnormality in bilateral femurs. No acute fracture or dislocation. Mild osteoarthritis of bilateral hips. Small bilateral hip joint effusions. Normal alignment. Ligaments, Muscles and Tendons Mild diffuse T2 hyperintense signal throughout the pelvic and femoral musculature without significant enhancement likely neurogenic. No intramuscular drainable fluid collection or hematoma. Soft tissue Diffuse soft tissue edema throughout the subcutaneous fat of the pelvis and bilateral femurs which may be secondary to anasarca or fluid overload versus severe cellulitis. T2 hyperintense well-circumscribed fluid collection adjacent to the pectineus muscle bilaterally without enhancement unchanged compared with prior CT abdomen dated 06/21/2018. No other drainable fluid collection. Moderate pelvic free fluid. IMPRESSION: 1. Bilateral above the  knee amputation through the distal femoral diaphysis without evidence of osteomyelitis. 2. Diffuse soft tissue edema throughout the subcutaneous fat of the pelvis and bilateral femurs which may be secondary to anasarca. No drainable fluid collection to suggest an abscess. 3. Mild osteoarthritis of bilateral hips with bilateral hip joint effusions. Electronically Signed   By: Kathreen Devoid   On: 08/18/2018 08:34   Dg Chest Port 1 View  Result Date: 08/20/2018 CLINICAL DATA:  Hypoxia EXAM: PORTABLE CHEST 1 VIEW COMPARISON:  08/15/2018 FINDINGS: 1501 hours. Interval development  of diffuse micro nodular opacity in both lungs with an upper lobe predominance. Cardiopericardial silhouette is enlarged. No substantial pleural effusion. Nodular opacity overlying the right base is similar to a nodular opacity along the left chest wall, likely representing nipple shadows. The visualized bony structures of the thorax are intact. Telemetry leads overlie the chest. IMPRESSION: Cardiomegaly with diffuse interstitial and micro nodular opacity with an upper lung predominance. Imaging features are compatible with diffuse infection or pulmonary edema. Electronically Signed   By: Misty Stanley M.D.   On: 08/20/2018 15:19   Dg Chest Port 1 View  Result Date: 08/15/2018 CLINICAL DATA:  Follow-up pneumonia EXAM: PORTABLE CHEST 1 VIEW COMPARISON:  08/13/2018 FINDINGS: Cardiac shadow remains enlarged. Aortic calcifications are again seen. The lungs are well aerated bilaterally. Some patchy atelectatic changes are noted in the right mid lung and left lung base slightly increased when compared with the prior exam. Previously seen nipple shadows are visualized more inferiorly. No sizable effusion is seen. No bony abnormality is noted. IMPRESSION: Patchy bilateral atelectasis. Electronically Signed   By: Inez Catalina M.D.   On: 08/15/2018 07:39   Dg Chest Port 1 View  Result Date: 08/13/2018 CLINICAL DATA:  Initial evaluation for acute fever. EXAM: PORTABLE CHEST 1 VIEW COMPARISON:  Prior radiograph from 07/30/2018. FINDINGS: Cardiomegaly, unchanged. Mediastinal silhouette within normal limits. Aortic atherosclerosis. Lungs mildly hypoinflated. Mild diffuse pulmonary interstitial edema. No appreciable pleural effusion. 19 mm nodular density overlying the mid-lower left lung most likely reflects a nipple shadow. Similar shadow seen overlying the lateral margin of the right hemithorax. No other focal infiltrates. No pneumothorax. No acute osseous abnormality. IMPRESSION: 1. Cardiomegaly with mild diffuse  pulmonary interstitial congestion/edema. 2. No other active cardiopulmonary disease.  No focal infiltrates. 3. Nodular densities overlying the bilateral lung bases, most consistent with nipple shadows. 4. Aortic atherosclerosis. Electronically Signed   By: Jeannine Boga M.D.   On: 08/13/2018 03:20   Dg Chest Portable 1 View  Result Date: 07/24/2018 CLINICAL DATA:  Chest pain EXAM: PORTABLE CHEST 1 VIEW COMPARISON:  Chest radiograph 06/20/2018 FINDINGS: Moderate cardiomegaly with calcific aortic atherosclerosis. No pulmonary edema or focal airspace consolidation. No pleural effusion or pneumothorax. IMPRESSION: Unchanged moderate cardiomegaly without pulmonary edema. Electronically Signed   By: Ulyses Jarred M.D.   On: 07/24/2018 03:55   Dg Femur Port 1v Left  Result Date: 08/13/2018 CLINICAL DATA:  Initial evaluation for acute fever. EXAM: LEFT FEMUR PORTABLE 1 VIEW COMPARISON:  Recent radiograph from 07/30/2018. FINDINGS: Patient status post AKA. Subtle periosteal reaction along the amputated margin of the left femur, similar to previous exam. Additionally, there is subtle osseous erosion at the amputated margin, not definitely seen on previous, suspicious for possible acute osteomyelitis. Overlying soft tissue irregularity without soft tissue emphysema. No acute fracture or dislocation. Osteoarthritic changes noted about the left hip. IMPRESSION: Subtle periosteal reaction with osseous erosion at the amputated margin of the left femoral AKA, suspicious for possible  acute osteomyelitis. Further evaluation and confirmation could be performed with follow-up MRI as clinically warranted. Electronically Signed   By: Jeannine Boga M.D.   On: 08/13/2018 04:46   Dg Femur Port, 1v Right  Result Date: 08/13/2018 CLINICAL DATA:  Initial evaluation for acute fever, evaluate for infection. EXAM: RIGHT FEMUR PORTABLE 1 VIEW COMPARISON:  Prior radiograph from 08/09/2018. FINDINGS: Patient is status post  AKA. Subtle periosteal reaction along the medial margin of the amputated femur again seen, stable from previous. No interval osseous erosion. Mild overlying soft tissue irregularity. No radiopaque foreign body or soft tissue emphysema. No acute fracture or dislocation. Sclerotic lesion within the right femoral neck noted, unchanged. IMPRESSION: Similar appearance of right AKA with subtle periosteal reaction at the distal right femur, stable relative to recent radiograph from 08/09/2018. No interval osseous erosion or definite evidence for acute osteomyelitis. Again, further evaluation with dedicated MRI could be performed if there is clinical concern for ongoing osteomyelitis. Electronically Signed   By: Jeannine Boga M.D.   On: 08/13/2018 04:43   Dg Femur Min 2 Views Left  Result Date: 07/30/2018 CLINICAL DATA:  Patient found laying on a bench at [place] bus stop with pain in the legs. Erythema and swelling noted at site of amputation. EXAM: LEFT FEMUR 2 VIEWS COMPARISON:  None. FINDINGS: Periosteal new bone formation is noted about the tip of the remaining left femoral diaphysis status post above knee amputation. Findings raise concern for changes of osteomyelitis. Osteoarthritis of the left hip with moderate-to-marked joint space narrowing is identified. No acute fracture. IMPRESSION: Periosteal new bone formation about the tip of the remaining femoral diaphysis is noted. Findings raise concern for osteomyelitis. Electronically Signed   By: Ashley Royalty M.D.   On: 07/30/2018 18:04   Dg Femur Min 2 Views Right  Result Date: 08/09/2018 CLINICAL DATA:  Stump pain with skin breakdown EXAM: RIGHT FEMUR 2 VIEWS COMPARISON:  07/30/2018 radiography. FINDINGS: Above the knee amputation with stable overlying thin soft tissue flap. No opaque foreign body or soft tissue gas. Heterotopic ossification about the osteotomy superimposed mild periosteal reaction has occurred since prior. There is osteopenia of the stump  that is stable. IMPRESSION: No definite acute finding or osteomyelitis. There is subtle periosteal reaction that has occurred since 07/30/2018, a MRI could be obtained if there is ongoing concern for osteomyelitis. Electronically Signed   By: Monte Fantasia M.D.   On: 08/09/2018 11:14   Dg Femur Min 2 Views Right  Result Date: 07/30/2018 CLINICAL DATA:  Pt BIB EMS [place] bus [place]. Pt reports severe pain in both legs. Redness and swelling noted to the sites on both legs. Pt has bilateral AKA amputations. Hx of HTN and community acquired pneumonia. EXAM: RIGHT FEMUR 2 VIEWS COMPARISON:  None. FINDINGS: Previous amputation across the midshaft of the right femur. Amputated margin appears well defined no focal bone resorption is seen to suggest osteomyelitis. There is no acute fracture. Hip joint is normally spaced and aligned. No soft tissue air. IMPRESSION: 1. No fracture.  No evidence of osteomyelitis.  No acute finding. Electronically Signed   By: Lajean Manes M.D.   On: 07/30/2018 18:02   US Abdomen Limited Ruq  Result Date: 07/30/2018 CLINICAL DATA:  Elevated liver enzymes EXAM: ULTRASOUND ABDOMEN LIMITED RIGHT UPPER QUADRANT COMPARISON:  July 29, 2018 CT abdomen and pelvis FINDINGS: Gallbladder: No gallstones are evident. Gallbladder wall is slightly thickened, a finding that may be due to ascites  which is present. There is no appreciable pericholecystic fluid. No sonographic [name] sign noted by sonographer. Common bile duct: Diameter: 4 mm. No intrahepatic or extrahepatic biliary duct dilatation. Liver: No focal lesion identified. Within normal limits in parenchymal echogenicity. Portal vein is patent on color Doppler imaging with normal direction of blood flow towards the liver. Ascites noted. IMPRESSION: 1.  Relatively mild ascites noted. 2. Gallbladder wall slightly thickened. This is a finding that may be secondary to ascites. No gallstones or pericholecystic fluid  seen. It should be noted that acalculus cholecystitis could present in this manner. In this regard, it may be prudent to correlate with nuclear medicine hepatobiliary imaging study to assess for cystic duct patency. 3. No focal liver lesions are appreciable. The changes suggesting chronic passive congestion on recent CT within the liver are not demonstrated by ultrasound. Electronically Signed   By: Lowella Grip III M.D.   On: 07/30/2018 21:52     The results of significant diagnostics from this hospitalization (including imaging, microbiology, ancillary and laboratory) are listed below for reference.     Microbiology: Recent Results (from the past 240 hour(s))  Blood culture (routine x 2)     Status: None   Collection Time: 08/13/18  3:35 AM  Result Value Ref Range Status   Specimen Description BLOOD SITE NOT SPECIFIED  Final   Special Requests   Final    BOTTLES DRAWN AEROBIC ONLY Blood Culture adequate volume   Culture   Final    NO GROWTH 5 DAYS Performed at [facility], [address] [place], [place] [address]    Report Status 08/18/2018 FINAL  Final  Blood culture (routine x 2)     Status: None   Collection Time: 08/13/18  4:15 AM  Result Value Ref Range Status   Specimen Description BLOOD RIGHT ELBOW  Final   Special Requests   Final    BOTTLES DRAWN AEROBIC AND ANAEROBIC Blood Culture adequate volume   Culture   Final    NO GROWTH 5 DAYS Performed at [facility], [place] [address], [place], [place] [address]    Report Status 08/18/2018 FINAL  Final     Labs: BNP (last 3 results) Recent Labs    05/19/18 2116 06/20/18 2239  BNP 3,603.1* 5,956.3*   Basic Metabolic Panel: Recent Labs  Lab 08/16/18 0335 08/17/18 0332 08/18/18 0416 08/20/18 0639 08/21/18 0345 08/22/18 0339  NA 134* 134* 136 137 137 135  K 4.4 4.3 3.8 3.4* 4.3 3.5  CL 110 110 109 107 105 101  CO2 16* 17* 17* 23 23 26   GLUCOSE 80 92 95 78 92 85  BUN 27* 21* 15 10 13 12    CREATININE 0.88 0.69 0.72 0.79 0.89 0.70  CALCIUM 8.1* 8.1* 8.0* 8.0* 8.7* 8.6*  MG 2.0 1.8  --   --   --   --   PHOS 1.8* 1.5* 2.8  --   --   --    Liver Function Tests: Recent Labs  Lab 08/16/18 0335 08/17/18 0332 08/18/18 0416 08/20/18 0639  AST 616* 287* 137* 51*  ALT 610* 457* 320* 173*  ALKPHOS 103 98 95 90  BILITOT 4.3* 4.0* 3.7* 4.5*  PROT 6.1* 6.0* 5.8* 5.4*  ALBUMIN 2.6* 2.5* 2.3* 2.1*   No results for input(s): LIPASE, AMYLASE in the last 168 hours. Recent Labs  Lab 08/16/18 1655 08/17/18 0332  AMMONIA 32 30   CBC: Recent Labs  Lab 08/17/18 0332 08/18/18 0416  08/20/18 0639 08/21/18 0345 08/22/18 0339  WBC 13.4* 15.0* 11.5* 12.8* 11.6*  HGB 10.4* 9.2* 10.4* 10.0* 10.1*  HCT 34.5* 31.7* 33.9* 33.5* 34.0*  MCV 85.6 86.4 84.1 83.3 83.1  PLT 71* 106* 153 216 232   Cardiac Enzymes: No results for input(s): CKTOTAL, CKMB, CKMBINDEX, TROPONINI in the last 168 hours. BNP: Invalid input(s): POCBNP CBG: No results for input(s): GLUCAP in the last 168 hours. D-Dimer No results for input(s): DDIMER in the last 72 hours. Hgb A1c No results for input(s): HGBA1C in the last 72 hours. Lipid Profile No results for input(s): CHOL, HDL, LDLCALC, TRIG, CHOLHDL, LDLDIRECT in the last 72 hours. Thyroid function studies No results for input(s): TSH, T4TOTAL, T3FREE, THYROIDAB in the last 72 hours.  Invalid input(s): FREET3 Anemia work up No results for input(s): VITAMINB12, FOLATE, FERRITIN, TIBC, IRON, RETICCTPCT in the last 72 hours. Urinalysis    Component Value Date/Time   COLORURINE AMBER (A) 08/13/2018 0552   APPEARANCEUR CLEAR 08/13/2018 0552   LABSPEC 1.017 08/13/2018 0552   PHURINE 5.0 08/13/2018 0552   GLUCOSEU NEGATIVE 08/13/2018 0552   HGBUR MODERATE (A) 08/13/2018 0552   BILIRUBINUR NEGATIVE 08/13/2018 0552   BILIRUBINUR small 04/29/2015 1533   KETONESUR 5 (A) 08/13/2018 0552   PROTEINUR 100 (A) 08/13/2018 0552   UROBILINOGEN 1.0 07/26/2017  1550   NITRITE NEGATIVE 08/13/2018 0552   LEUKOCYTESUR NEGATIVE 08/13/2018 0552   Sepsis Labs Invalid input(s): PROCALCITONIN,  WBC,  LACTICIDVEN Microbiology Recent Results (from the past 240 hour(s))  Blood culture (routine x 2)     Status: None   Collection Time: 08/13/18  3:35 AM  Result Value Ref Range Status   Specimen Description BLOOD SITE NOT SPECIFIED  Final   Special Requests   Final    BOTTLES DRAWN AEROBIC ONLY Blood Culture adequate volume   Culture   Final    NO GROWTH 5 DAYS Performed at [facility], [place] [address] [place], [place] [address]    Report Status 08/18/2018 FINAL  Final  Blood culture (routine x 2)     Status: None   Collection Time: 08/13/18  4:15 AM  Result Value Ref Range Status   Specimen Description BLOOD RIGHT ELBOW  Final   Special Requests   Final    BOTTLES DRAWN AEROBIC AND ANAEROBIC Blood Culture adequate volume   Culture   Final    NO GROWTH 5 DAYS Performed at [facility], [place] [address], [place], [place] [address]    Report Status 08/18/2018 FINAL  Final     Time coordinating discharge in minutes: 65  SIGNED:   Debbe Odea, MD  [facility] 08/22/2018, 8:46 AM Pager   If 7PM-7AM, please contact night-coverage [email or web address] Password [email or web address]

## 2018-08-22 NOTE — Progress Notes (Signed)
Pt pulled off stump dressings and several of her foam dressings. Kerlix gauze rolls are currently out of stock, still has xeroform gauze dressings on open sores. Pt refuses to have them covered at this time d/t pain, PRNs given. Will continue to monitor.

## 2018-08-23 LAB — CBC
HEMATOCRIT: 33.9 % — AB (ref 36.0–46.0)
HEMOGLOBIN: 10.1 g/dL — AB (ref 12.0–15.0)
MCH: 24.6 pg — ABNORMAL LOW (ref 26.0–34.0)
MCHC: 29.8 g/dL — ABNORMAL LOW (ref 30.0–36.0)
MCV: 82.5 fL (ref 80.0–100.0)
NRBC: 0 % (ref 0.0–0.2)
Platelets: 275 10*3/uL (ref 150–400)
RBC: 4.11 MIL/uL (ref 3.87–5.11)
RDW: 19.4 % — AB (ref 11.5–15.5)
WBC: 11 10*3/uL — ABNORMAL HIGH (ref 4.0–10.5)

## 2018-08-23 LAB — BASIC METABOLIC PANEL
Anion gap: 9 (ref 5–15)
BUN: 14 mg/dL (ref 6–20)
CALCIUM: 9.1 mg/dL (ref 8.9–10.3)
CO2: 28 mmol/L (ref 22–32)
Chloride: 98 mmol/L (ref 98–111)
Creatinine, Ser: 0.7 mg/dL (ref 0.44–1.00)
GFR calc Af Amer: >60 mL/min (ref >60)
GFR calc non Af Amer: >60 mL/min (ref >60)
Glucose, Bld: 96 mg/dL (ref 70–99)
Potassium: 4.3 mmol/L (ref 3.5–5.1)
SODIUM: 135 mmol/L (ref 135–145)

## 2018-08-23 MED ORDER — DOXYCYCLINE HYCLATE 100 MG PO TABS
100.0000 mg | ORAL_TABLET | Freq: Two times a day (BID) | ORAL | Status: AC
  Administered 2018-08-23: 100 mg via ORAL
  Filled 2018-08-23: qty 1

## 2018-08-23 MED ORDER — AMOXICILLIN-POT CLAVULANATE 875-125 MG PO TABS
1.0000 | ORAL_TABLET | Freq: Two times a day (BID) | ORAL | Status: AC
  Administered 2018-08-23: 1 via ORAL
  Filled 2018-08-23: qty 1

## 2018-08-23 MED ORDER — FUROSEMIDE 40 MG PO TABS
40.0000 mg | ORAL_TABLET | Freq: Every day | ORAL | Status: DC
  Administered 2018-08-23 – 2018-08-26 (×4): 40 mg via ORAL
  Filled 2018-08-23 (×4): qty 1

## 2018-08-23 MED ORDER — HYDROCOD POLST-CPM POLST ER 10-8 MG/5ML PO SUER
5.0000 mL | Freq: Two times a day (BID) | ORAL | Status: DC
  Administered 2018-08-23 – 2018-08-26 (×7): 5 mL via ORAL
  Filled 2018-08-23 (×7): qty 5

## 2018-08-23 NOTE — Care Management Note (Signed)
Case Management Note [name] RN, BSN Transitions of Care Unit 4E- RN Case Manager [phone]  Patient Details  Name: [name] MRN: [id number] Date of Birth: [birth year]  Subjective/Objective:  Pt admitted with sepsis, infected AKA stumps,                   Action/Plan: PTA pt homeless, CSW consulted for possible placement, hx of active drug use. CSW and CM has spoken with pt regarding transition plans, pt is not willing to go to brother's house, and has not agreed to go to a SNF at this time. Pt also has not provided CM or CSW with an address or phone # for any other friend or family member that she plans to go home with. Pt was admitted without her w/c and pt does not recall where her w/c is at this time. Have spoken with Butch Penny at [facility] and per conversation [facility] was working on getting pt a power chair but has not been able to contact pt since last discharge- pt apparently had a loaner chair. [facility] will be able to provide pt a manual w/c with MD order at time of discharge but will need an address and phone # that they can contact pt at to f/u on power chair that is pending for pt. W/c order has been placed by MD- CM will f/u with pt for address and phone # that can be used to contact her. CSW to f/u with pt for transition plan. Will have w/c delivered to bedside on 11/20 once approved.   Expected Discharge Date:  08/24/18               Expected Discharge Plan:  [place] (pt has declined)  In-House Referral:  Clinical Social Work  Discharge planning Services  CM Consult  Post Acute Care Choice:  Durable Medical Equipment Choice offered to:  Patient  DME Arranged:  Programmer, multimedia DME Agency:  [place]:    [place] Agency:     Status of Service:  In process, will continue to follow  If discussed at Long Length of Stay Meetings, dates discussed:    Discharge Disposition:    Additional Comments:  Dawayne Patricia, RN 08/23/2018,  4:26 PM

## 2018-08-24 IMAGING — DX DG ANKLE COMPLETE 3+V*L*
3 series · 3 of 3 positions shown · non-contrast
Comparison: Left tibia and fibula films of 09/02/2016

CLINICAL DATA: Cocaine induced vasculitis with lower extremity
wound

EXAM:
LEFT ANKLE COMPLETE - 3+ VIEW

[x ankle ap left]
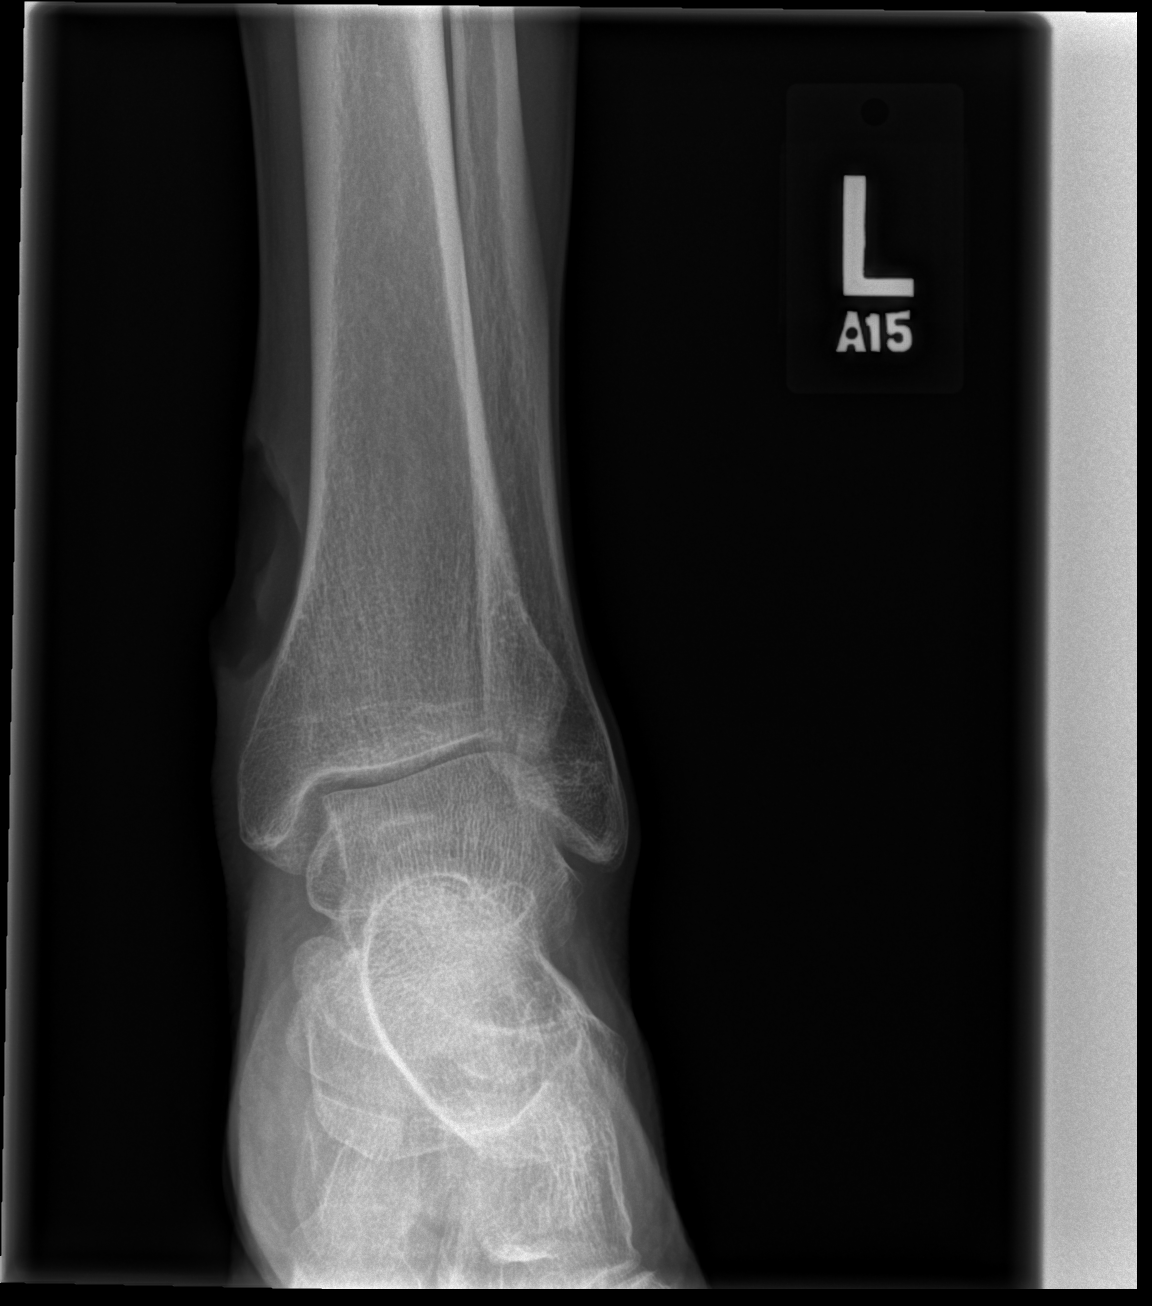

[x ankle obl left]
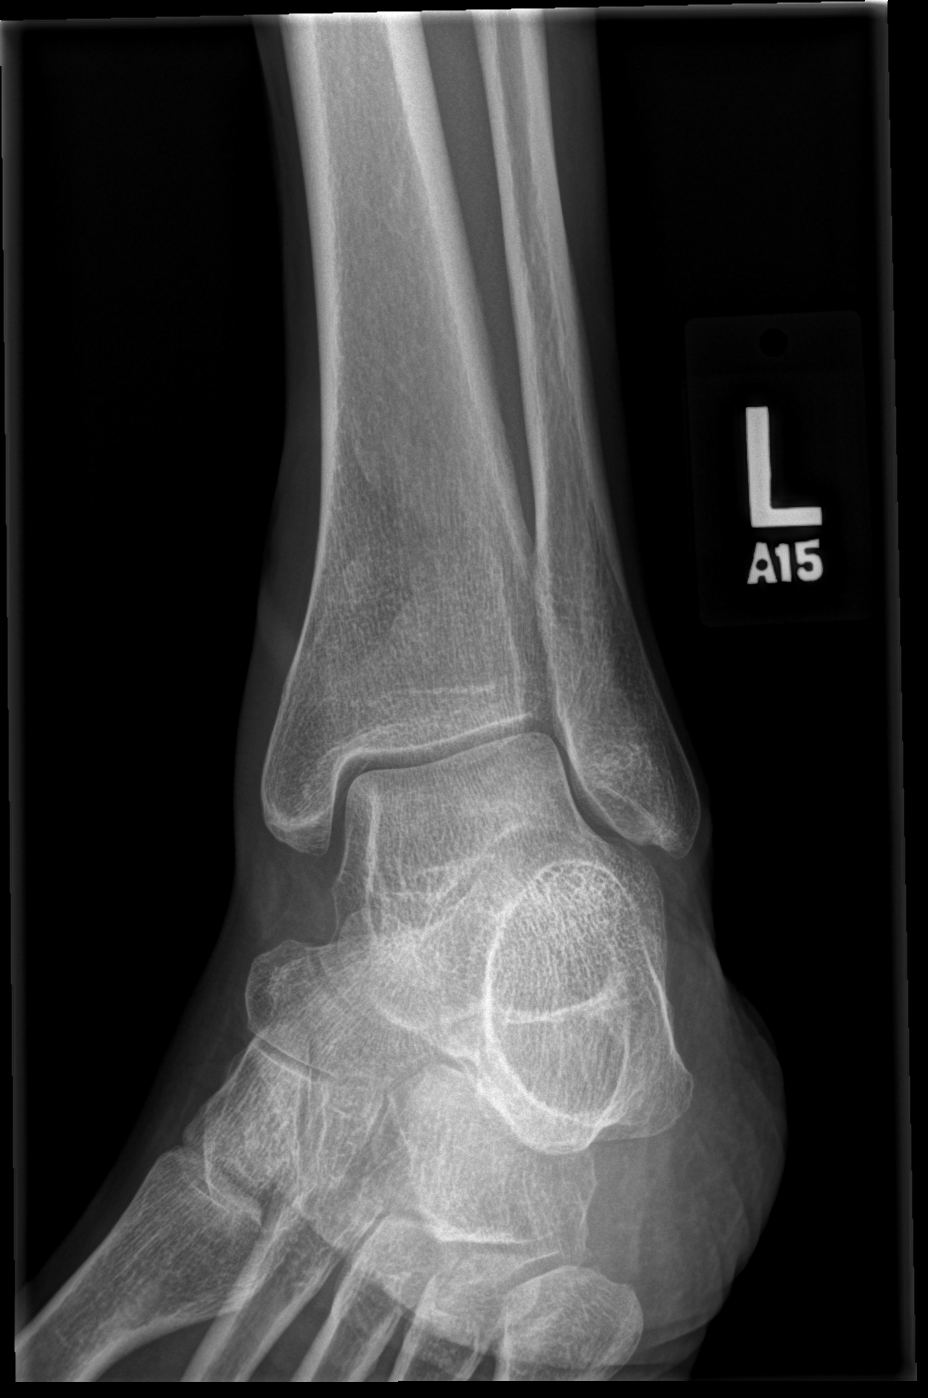

[x ankle lat left]
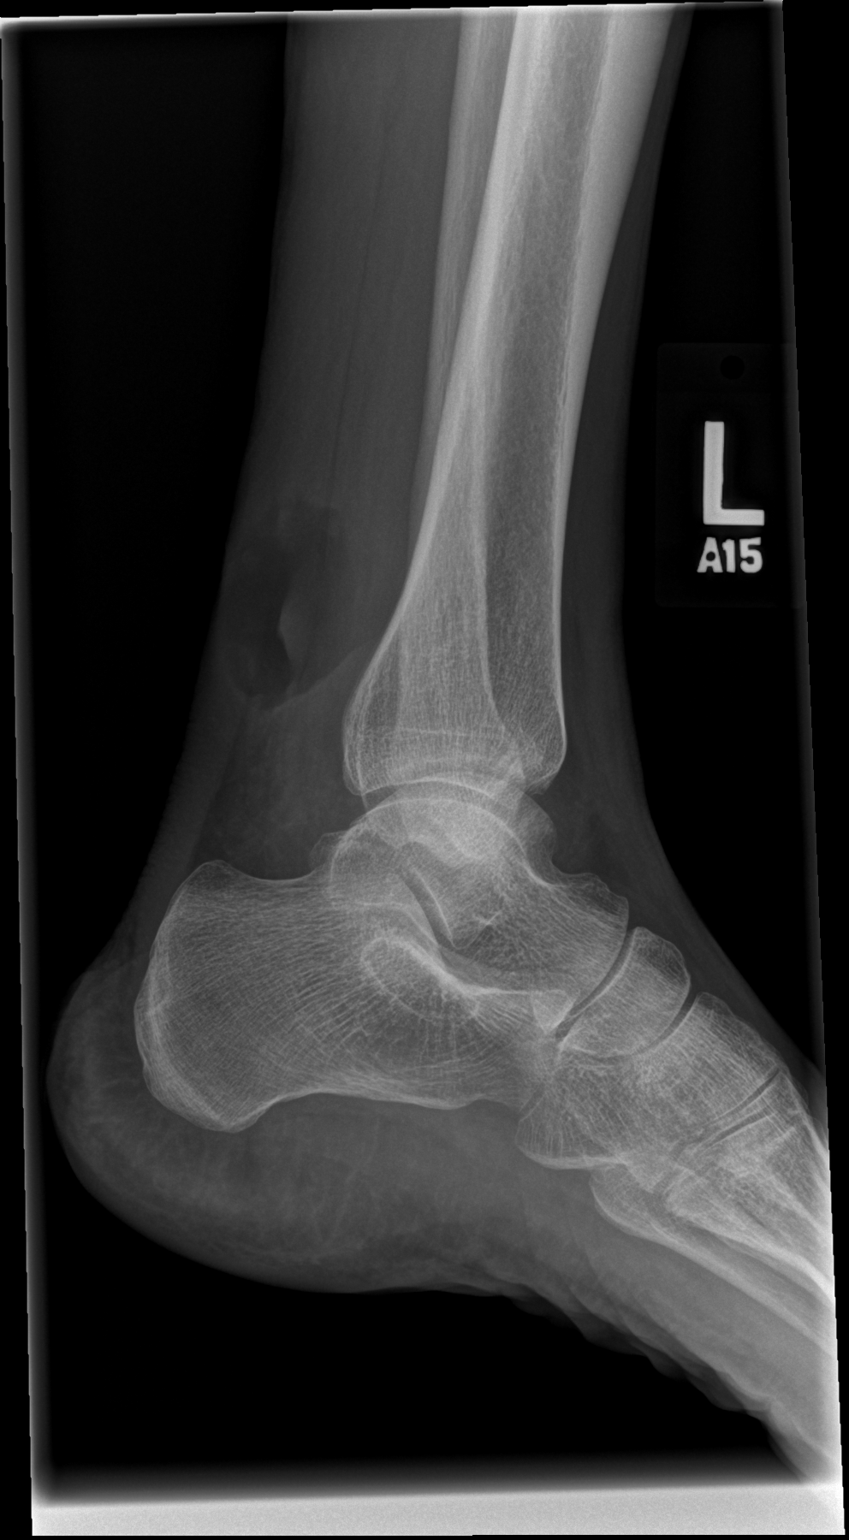

[3 of 3 positions shown; findings below may reference images not displayed]

FINDINGS: A soft tissue defect is noted along the distal left tibia medially.
No underlying bony erosion or periosteal reaction is seen. The ankle
joint is unremarkable. Alignment is normal.
IMPRESSION: Soft tissue defect within the distal left lower leg medially and
posteriorly with no underlying bony abnormality.

## 2018-08-24 IMAGING — DX DG TIBIA/FIBULA 2V*R*
4 series · 4 of 4 positions shown · non-contrast
Comparison: 09/02/2016.

CLINICAL DATA: Lower extremity wound.  Cocaine induced vasculitis.

EXAM:
RIGHT TIBIA AND FIBULA - 2 VIEW

[x tib-fib ap right (1 of 2)]
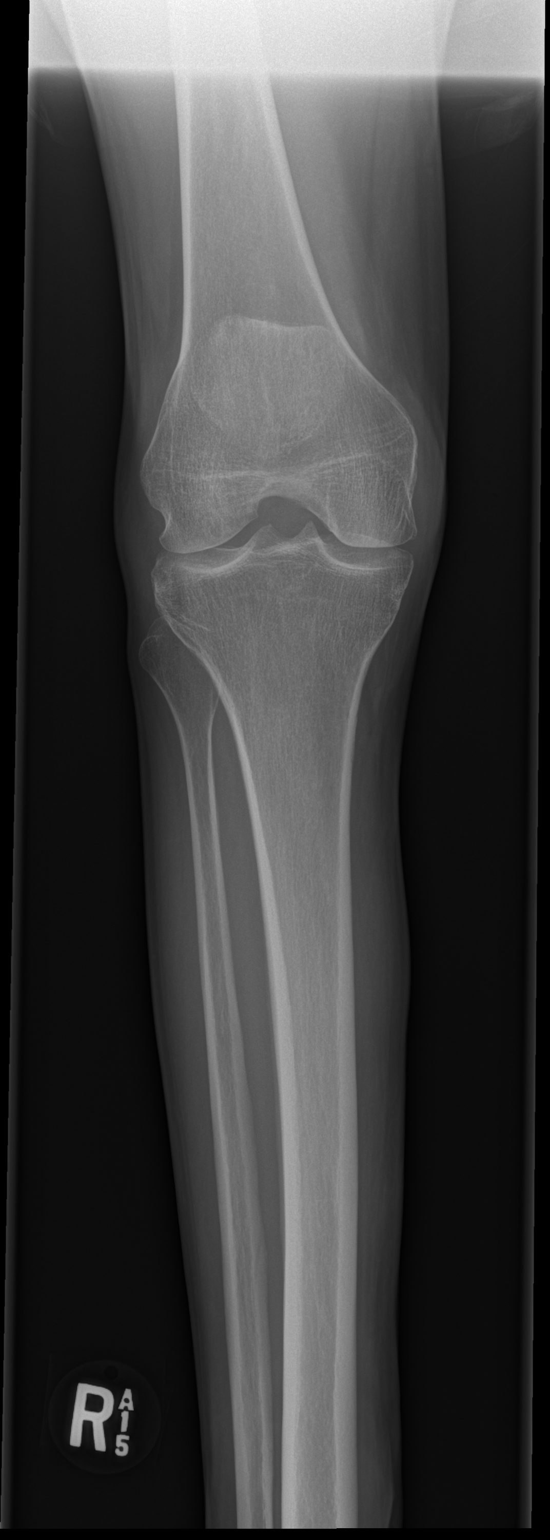

[x tib-fib ap right (2 of 2)]
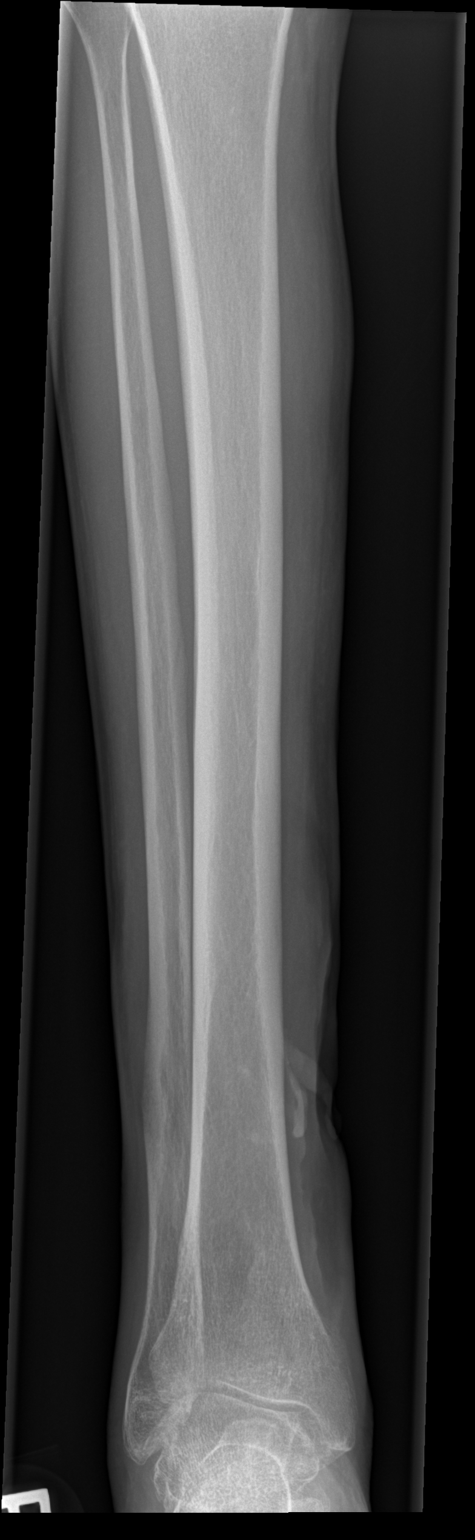

[x tib-fib lat right (1 of 2)]
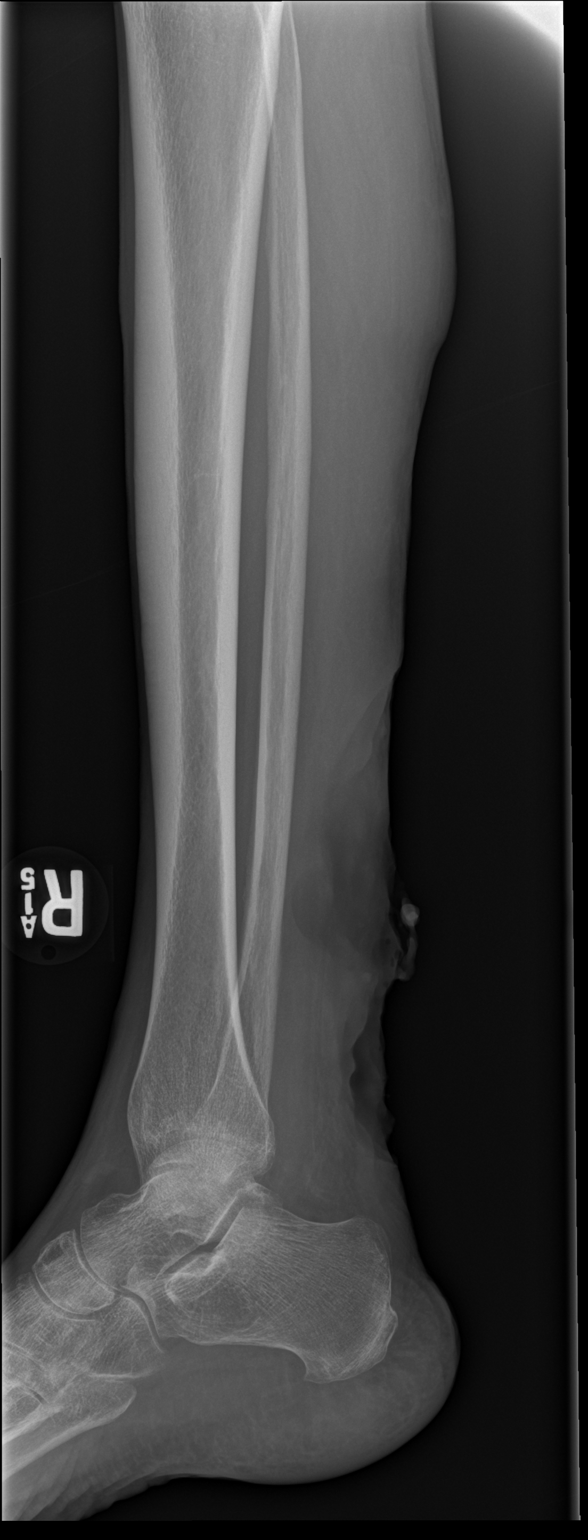

[x tib-fib lat right (2 of 2)]
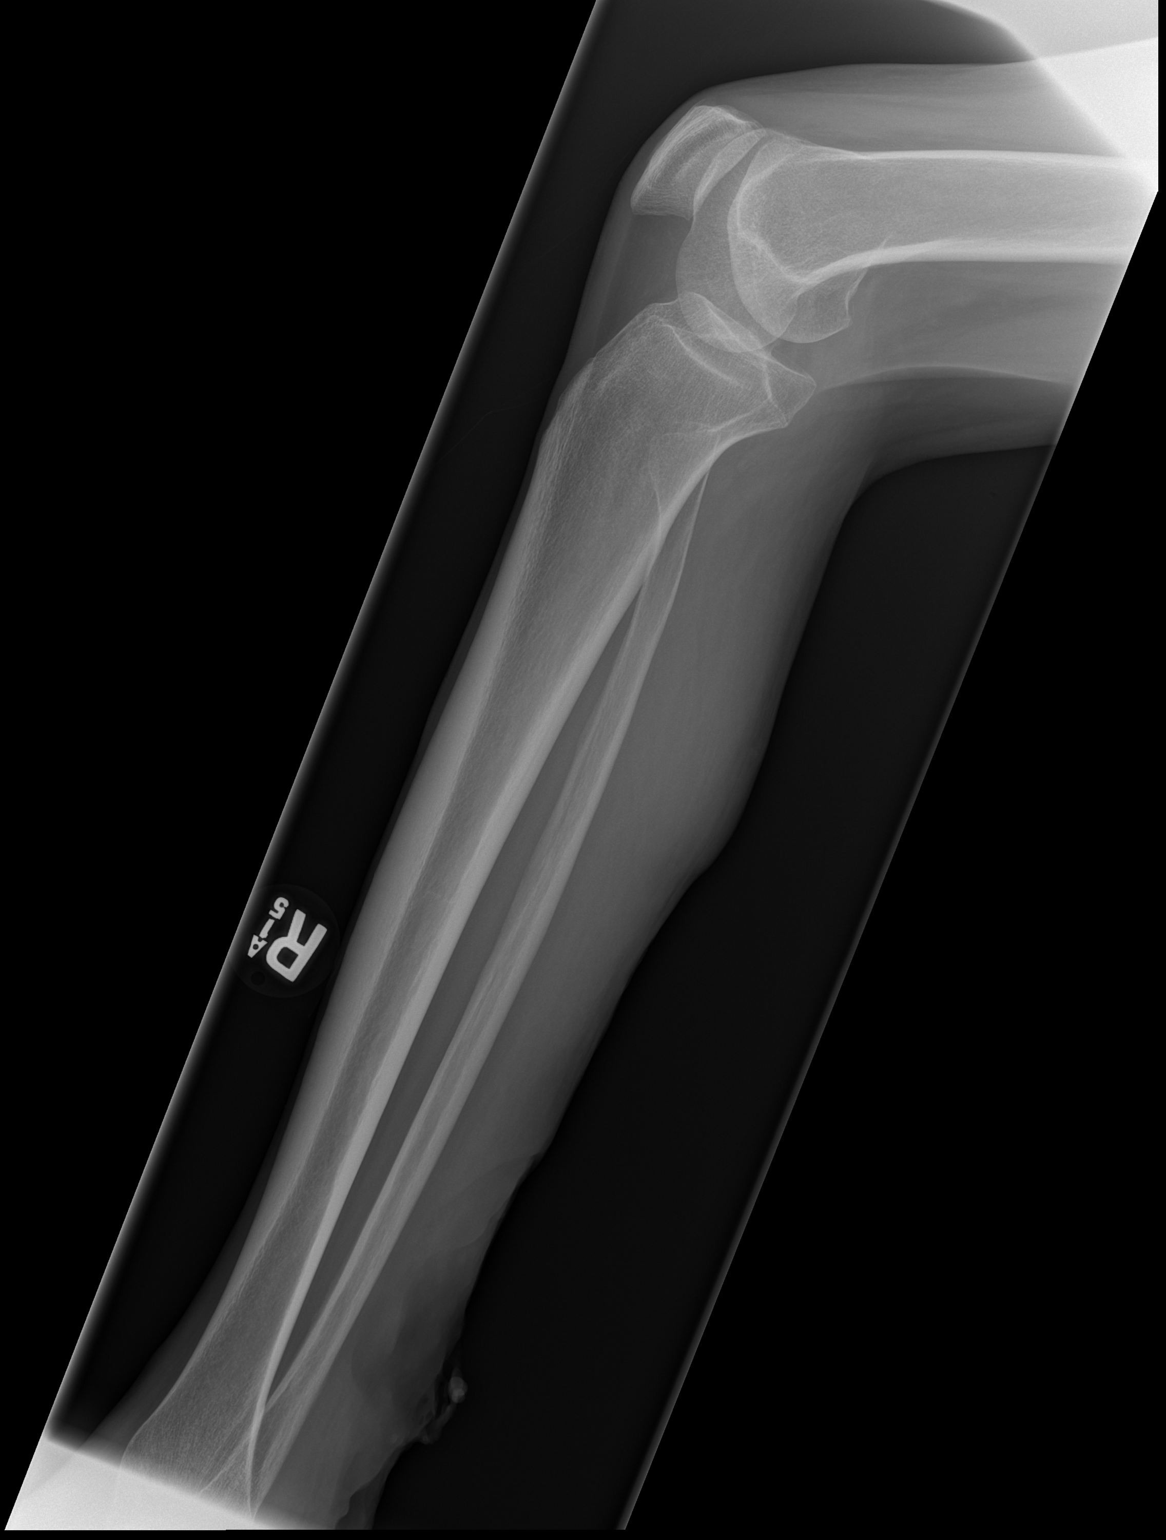

[4 of 4 positions shown; findings below may reference images not displayed]

FINDINGS: No acute bony or joint abnormality. Irregular prominent wound noted
along the posterior aspect of the distal right lower extremity. No
underlying bony lesion identified. No radiopaque foreign body .
IMPRESSION: Prominent irregular wound again noted along the posterior aspect of
the right lower extremity. No underlying bony abnormality. No
radiopaque foreign body.

## 2018-08-24 NOTE — Plan of Care (Signed)
Had the opportunity to speak with patient's brother in the presence of patient. Brother states that patient is allowed to come to his home at discharge. Patient refuses.States that she is not going to brother's home and would rather be homeless than go there. Talked to the patient about SNF placement . Patient again refuses stating that she is not giving up her check to go there and she can care for self. Stated to patient that she is nearing discharge and concerns is for where she will go after discharge.  Patient stated that she would go to [facility].

## 2018-08-24 NOTE — Progress Notes (Signed)
PT Cancellation Note  Patient Details Name: [name] MRN: [id number] DOB: Apr 09, 1961   Cancelled Treatment:    Reason Eval/Treat Not Completed: Patient declined, no reason specified. Pt politely declined therapy services, despite encouragement to practice transfers from bed <> wheelchair, which recently arrived. Pt stating, "I'm just going to go."   Ellamae Sia, PT, DPT Acute Rehabilitation Services Pager [phone] Office [phone]    Willy Eddy 08/24/2018, 2:59 PM

## 2018-08-24 NOTE — Clinical Social Work Note (Signed)
CSW talked with patient today, with nurse case manager and alone regarding her discharge plan. Patient does not have a plan and when mentioned, adamantly refusing to go to her brother's home, stating that she knows her brother and we don't.  CSW contacted [facility] and they are full (shelter and overflow). CSW advised by [facility] staff person that SA also full as they have tried to send people there. Call made to [facility] [phone]) and they are full. Call made to [facility] in [place] [phone]) and they were also full. CSW updated Steffanie Dunn, nurse case manger and the plan will be for patient to discharge tomorrow morning to the [facility], where she has access to staff to assist her and phone to make calls. Nurse case manager contacted MD and provided update. CSW visited again with patient and updated on plan and she is in agreement and thanked CSW.  Drury Ardizzone Givens, MSW, LCSW Licensed Clinical Social Worker [place] [phone]

## 2018-08-24 NOTE — Progress Notes (Signed)
[name], female   DOB: 04-Aug-1961, 57 y.o.   MRN: [id number] Patient was supposed to be discharged to SNF but no bed is available.  Patient seen and examined at bedside.  Patient is medically stable for discharge to SNF.  Please refer to the discharge summary done by Dr. Debbe Odea on11/19/2019 for full details.

## 2018-08-24 NOTE — Progress Notes (Signed)
Patient suffers from peripheral vascular disease post bilateral above knee amputations which impairs their ability to perform daily activities like ADL's and mobility  in the home.  A walker alone will not resolve the issues with performing activities of daily living. A wheelchair will allow patient to safely perform daily activities.  The patient can self propel in the home or has a caregiver who can provide assistance.     Ellamae Sia, PT, DPT Acute Rehabilitation Services Pager [phone] Office [phone]

## 2018-08-25 DIAGNOSIS — A419 Sepsis, unspecified organism: Principal | ICD-10-CM

## 2018-08-25 DIAGNOSIS — N179 Acute kidney failure, unspecified: Secondary | ICD-10-CM

## 2018-08-25 DIAGNOSIS — R6520 Severe sepsis without septic shock: Secondary | ICD-10-CM

## 2018-08-25 NOTE — Progress Notes (Signed)
Physical Therapy Treatment Patient Details Name: [name] MRN: [id number] DOB: [birth year] Today's Date: 08/25/2018    History of Present Illness 57 year old female with history of cocaine abuse, grade 2 diastolic congestive heart failure with EF of 60 to 65%, essential hypertension, peripheral vascular disease status post bilateral AKA, homeless presented with AKA stump pain and redness with x-ray femur concerning for osteomyelitis.    PT Comments    Patient now agreeable to SNF. Declining out of bed transfer at this time secondary to right residual limb pain. Patient received soaked in urine. Session focused on bed mobility to change bed linens and perform peri care. Requiring supervision for rolling.     Follow Up Recommendations  SNF;Supervision for mobility/OOB     Equipment Recommendations  Wheelchair (measurements PT)    Recommendations for Other Services       Precautions / Restrictions Precautions Precautions: Fall Restrictions Weight Bearing Restrictions: No    Mobility  Bed Mobility Overal bed mobility: Needs Assistance Bed Mobility: Rolling Rolling: Supervision         General bed mobility comments: Patient rolling multiple times to right and left with supervision and use of bed rails to provide peri care, change bed linens, and gown.   Transfers                 General transfer comment: Declining OOB transfer  Ambulation/Gait                 Stairs             Wheelchair Mobility    Modified Rankin (Stroke Patients Only)       Balance                                            Cognition Arousal/Alertness: Awake/alert Behavior During Therapy: WFL for tasks assessed/performed Overall Cognitive Status: No family/caregiver present to determine baseline cognitive functioning Area of Impairment: Safety/judgement;Awareness                         Safety/Judgement: Decreased awareness of  safety Awareness: Intellectual;Anticipatory          Exercises      General Comments        Pertinent Vitals/Pain Pain Assessment: Faces Faces Pain Scale: Hurts whole lot Pain Location: right AKA Pain Descriptors / Indicators: Grimacing;Constant;Tender Pain Intervention(s): Limited activity within patient's tolerance;Monitored during session;Repositioned    Home Living                      Prior Function            PT Goals (current goals can now be found in the care plan section) Acute Rehab PT Goals Patient Stated Goal: none stated PT Goal Formulation: With patient Time For Goal Achievement: 08/16/18 Potential to Achieve Goals: Fair    Frequency    Min 2X/week      PT Plan Current plan remains appropriate    Co-evaluation              AM-PAC PT "6 Clicks" Daily Activity  Outcome Measure  Difficulty turning over in bed (including adjusting bedclothes, sheets and blankets)?: Unable Difficulty moving from lying on back to sitting on the side of the bed? : Unable Difficulty sitting down on and standing up from a chair with  arms (e.g., wheelchair, bedside commode, etc,.)?: Unable Help needed moving to and from a bed to chair (including a wheelchair)?: Total Help needed walking in hospital room?: Total Help needed climbing 3-5 steps with a railing? : Total 6 Click Score: 6    End of Session   Activity Tolerance: Patient limited by pain Patient left: in bed;with call bell/phone within reach Nurse Communication: Mobility status PT Visit Diagnosis: Pain;Muscle weakness (generalized) (M62.81) Pain - Right/Left: Right Pain - part of body: (residual limb)     Time: 0174-9449 PT Time Calculation (min) (ACUTE ONLY): 18 min  Charges:  $Therapeutic Activity: 8-22 mins                     Ellamae Sia, PT, DPT Acute Rehabilitation Services Pager [phone] Office [phone]    Willy Eddy 08/25/2018, 5:18 PM

## 2018-08-25 NOTE — Progress Notes (Signed)
Palliative: [name], [name], is resting quietly in bed.  She will make but not keep eye contact.  Present today at bedside is Education officer, museum, verifying that [name] continues to decline placement in ALF/SNF.  We share our concern over her safety and comfort, but she seems unable to make a different choice.  Social worker finishes their duties, and Conner and I talk.  She tells me that she has her cell phone, and will charge it [facility]  I share my concern over her chronic infection, her comfort.  She is encouraged to keep a watch for return infection.  I share whether this is in 2 days, 2 weeks, or 2 months I anticipate more problems.  She agrees.  We talked about returning to the hospital if/when needed.  We also talked about CODE STATUS.  [name] is mostly quiet throughout the visit, she is quite tearful today also.  I encouraged her to ask for PMT if/when she returns.  Conference with case manager related to goals of care discussion, CODE STATUS discussion, disposition.  15 minutes Quinn Axe, NP Palliative medicine team [phone]

## 2018-08-25 NOTE — Discharge Summary (Addendum)
Physician Discharge Summary  [name] WOE:[id number] DOB: [birth year] DOA: 08/13/2018  PCP: Azzie Glatter, FNP  Admit date: 08/13/2018 Discharge date: 08/25/2018  Admitted From: Homeless/Shelter Disposition:  SNF  Recommendations for Outpatient Follow-up:  1. Follow up with PCP in 1 week 2. Outpatient follow-up with Dr. Farley Ly 3. Comply with medications and follow-up 4. Abstain from illicit drug use   Home Health: No Equipment/Devices: None  Discharge Condition: Guarded CODE STATUS: Full Diet recommendation: Heart Healthy   Brief/Interim Summary: 57 year old female with history of levamisole induced vasculitis, PVD with bilateral AKA, anemia, migraine, hypertension, depression, substance abuse/cocaine use, multiple recent admissions for cellulitis of the stump presented with bilateral lower extremity pain and drainage.  She was started on IV antibiotics.  Orthopedics was consulted.  Dr. Sharol Given recommended continue wound dressing changes and no need for any further revision surgery.  Antibiotics were subsequently discontinued.  She was supposed to go to SNF but she refused should be discharged to a shelter.  Addendum: Patient was supposed to be discharged to shelter on 08/25/2018 but she changed her mind and now wants to go to SNF.  She was being prepared for SNF discharge earlier but she had denied it and wanted to go to a shelter.  She is medically stable for discharge.  Palliative care following but patient is still full code.  Overall prognosis is poor.    Discharge Diagnoses:  Principal Problem:   Sepsis ([place]) Active Problems:   Essential hypertension   Cocaine-induced vascular disorder (HCC)   Protein-calorie malnutrition, severe (HCC)   Acute kidney injury ([place])   Atherosclerosis of native arteries of extremities with gangrene, right leg (HCC)   Tobacco abuse   S/P bilateral below knee amputation (HCC)   Ulcer of amputation stump of lower extremity (HCC)    Homelessness   Elevated LFTs   S/P bilateral above knee amputation ([place])   Goals of care, counseling/discussion   Palliative care by specialist   DNR (do not resuscitate) discussion   Thrombocytopenia ([place])   Toxic encephalopathy   Atherosclerosis of native arteries of extremities with gangrene, left leg (HCC)  Severe sepsis -Resolved.  Currently hemodynamically stable.  Initially treated with antibiotics.  Bilateral above-knee amputation with ulcers -Patient was initially started on broad-spectrum antibiotics.  Dr. Sharol Given thought that patient did not have cellulitis.  Antibiotics were discontinued.  He recommended to continue wound dressing changes and did not feel that revision surgery would heal.  Outpatient follow-up with Dr. Sharol Given  Acute kidney injury -Most likely prerenal.  Resolved with hydration -Outpatient follow-up  Acute on chronic diastolic CHF leading to acute respiratory failure -Treated with IV Lasix.  Transition to oral Lasix.  Continue.  Continue losartan.  Outpatient follow-up.  Comply with medications  Severe protein calorie malnutrition -Follow-up nutrition recommendations  Hypokalemia -Replace  Homelessness -Social worker working on finding a shelter.  History of polysubstance/cocaine abuse -Counseled on cessation      Discharge Instructions  Discharge Instructions    Diet - low sodium heart healthy   Complete by:  As directed    Diet - low sodium heart healthy   Complete by:  As directed    Increase activity slowly   Complete by:  As directed    Increase activity slowly   Complete by:  As directed      Allergies as of 08/25/2018      Reactions   Acetaminophen Swelling, Other (See Comments)   Reaction:  Eyelid swelling   Haldol [haloperidol  Lactate]    Possible eye swelling was given together with Toradol unsure if caused the reaction   Lisinopril    UNSPECIFIED REACTION    Toradol [ketorolac Tromethamine]    Eye swelling       Medication List    STOP taking these medications   ondansetron 4 MG tablet Commonly known as:  ZOFRAN     TAKE these medications   acetaminophen 325 MG tablet Commonly known as:  TYLENOL Take 2 tablets (650 mg total) by mouth every 6 (six) hours as needed for mild pain, fever or headache.   amLODipine 10 MG tablet Commonly known as:  NORVASC Take 1 tablet (10 mg total) by mouth daily.   feeding supplement (ENSURE ENLIVE) Liqd Take 237 mLs by mouth 2 (two) times daily between meals.   furosemide 40 MG tablet Commonly known as:  LASIX Take 1 tablet (40 mg total) by mouth daily.   gabapentin 100 MG capsule Commonly known as:  NEURONTIN Take 1 capsule (100 mg total) by mouth 3 (three) times daily.   losartan 25 MG tablet Commonly known as:  COZAAR Take 1 tablet (25 mg total) by mouth daily.   ondansetron 4 MG disintegrating tablet Commonly known as:  ZOFRAN-ODT Take 1 tablet (4 mg total) by mouth every 8 (eight) hours as needed for nausea or vomiting.   oxyCODONE 5 MG immediate release tablet Commonly known as:  Oxy IR/ROXICODONE Take 1 tablet (5 mg total) by mouth every 4 (four) hours as needed for severe pain.   pantoprazole 40 MG tablet Commonly known as:  PROTONIX Take 1 tablet (40 mg total) by mouth daily.   polyethylene glycol packet Commonly known as:  MIRALAX / GLYCOLAX Take 17 g by mouth daily.   traMADol 50 MG tablet Commonly known as:  ULTRAM Take 2 tablets (100 mg total) by mouth 4 (four) times daily.            Durable Medical Equipment  (From admission, onward)         Start     Ordered   08/23/18 1524  For home use only DME standard manual wheelchair with seat cushion  Once    Comments:  Patient suffers from bilateral BKA which impairs their ability to perform daily activities in the home.   08/23/18 1523         Follow-up Information    Azzie Glatter, FNP. Schedule an appointment as soon as possible for a visit in 1 week(s).    Specialty:  Family Medicine Contact information: [place] [address] [phone]        Newt Minion, MD. Schedule an appointment as soon as possible for a visit in 1 week(s).   Specialty:  Orthopedic Surgery Contact information: [address] [place] [address] [phone]          Allergies  Allergen Reactions  . Acetaminophen Swelling and Other (See Comments)    Reaction:  Eyelid swelling  . Haldol [Haloperidol Lactate]     Possible eye swelling was given together with Toradol unsure if caused the reaction  . Lisinopril     UNSPECIFIED REACTION   . Toradol [Ketorolac Tromethamine]     Eye swelling    Consultations:  Orthopedics   Procedures/Studies: X-ray Chest Pa And Lateral  Result Date: 08/13/2018 CLINICAL DATA:  Pt presents from home with [facility] for c/o bilat BKAs with foul odor and drainage; pt with multiple "alevyn" dressings to stumps and  hips and sacrum; pt reports she continues to use cocaine. Hx of CAP,HTN. EXAM: CHEST - 2 VIEW COMPARISON:  08/13/2018 at 3:04 a.m. and older exams. FINDINGS: Mild cardiomegaly. No mediastinal or hilar masses. No convincing adenopathy. Central vascular congestion. Prominent bronchovascular markings are noted centrally and in the posteromedial lung bases. Chronic linear scarring in the right upper lobe. Nodular densities described previously are nipple shadows. No convincing lung nodules. No pleural effusion or pneumothorax. Skeletal structures are intact. IMPRESSION: 1. Cardiomegaly with vascular congestion and central and medial lung base bronchial wall thickening. Findings suggest bronchitis and/or mild interstitial edema. No focal pneumonia. Electronically Signed   By: Lajean Manes M.D.   On: 08/13/2018 08:52   Dg Chest 2 View  Result Date: 07/30/2018 CLINICAL DATA:  Pt BIB EMS found [place] bench [place] bus stop. Pt reports severe pain in both legs. Redness and swelling noted to the  sites on both legs. Pt has bilateral AKA amputations. Hx of HTN and community acquired pneumonia. EXAM: CHEST - 2 VIEW COMPARISON:  07/24/2018 and older exams. FINDINGS: Cardiac silhouette is mildly enlarged. No mediastinal or hilar masses. No evidence of adenopathy. Prominent nipple shadow overlies the right mid to lower lung. Mild linear scarring extends laterally from the right hilum, stable. Lungs are otherwise clear. No pleural effusion or pneumothorax. Skeletal structures are intact. IMPRESSION: No acute cardiopulmonary disease. Electronically Signed   By: Lajean Manes M.D.   On: 07/30/2018 18:05   Ct Head Wo Contrast  Result Date: 08/16/2018 CLINICAL DATA:  Altered level of consciousness. History of migraines, hypertension and cocaine abuse. EXAM: CT HEAD WITHOUT CONTRAST TECHNIQUE: Contiguous axial images were obtained from the base of the skull through the vertex without intravenous contrast. COMPARISON:  CT HEAD Feb 16, 2018 FINDINGS: BRAIN: No intraparenchymal hemorrhage, mass effect nor midline shift. Borderline parenchymal brain volume loss for age. No acute large vascular territory infarcts. No abnormal extra-axial fluid collections. Basal cisterns are patent. VASCULAR: Moderate calcific atherosclerosis carotid siphon. SKULL/SOFT TISSUES: No skull fracture. No significant soft tissue swelling. ORBITS/SINUSES: The included ocular globes and orbital contents are normal.Trace paranasal sinus mucosal thickening. Mastoid air cells are well aerated. OTHER: Patient is edentulous. IMPRESSION: 1. No acute intracranial process. 2. Borderline parenchymal brain volume loss for age. 3. Advanced moderate intracranial atherosclerosis. Electronically Signed   By: Elon Alas M.D.   On: 08/16/2018 22:28   Nm Hepatobiliary Liver Func  Result Date: 08/01/2018 CLINICAL DATA:  57 year old female with generalized abdominal pain. Gallbladder wall slightly thickened on recent ultrasound. Subsequent encounter.  EXAM: NUCLEAR MEDICINE HEPATOBILIARY IMAGING TECHNIQUE: Sequential images of the abdomen were obtained out to 60 minutes following intravenous administration of radiopharmaceutical. RADIOPHARMACEUTICALS:  5.1 mCi Tc-77m Choletec IV COMPARISON:  07/30/2018 ultrasound. FINDINGS: Prompt uptake and biliary excretion of activity by the liver is seen. Gallbladder activity is visualized, consistent with patency of cystic duct. Biliary activity passes into small bowel, consistent with patent common bile duct. IMPRESSION: Gallbladder and small bowel visualized consistent with patent cystic duct and patent common bile duct respectively. Electronically Signed   By: SGenia DelM.D.   On: 08/01/2018 15:25   [place]Abdomen Complete  Result Date: 08/17/2018 CLINICAL DATA:  Transaminitis EXAM: ABDOMEN ULTRASOUND COMPLETE COMPARISON:  Abdominal ultrasound of July 30 2018 FINDINGS: Gallbladder: The gallbladder is adequately distended. Echogenic bile or sludge is present. There no discrete stones. There is no gallbladder wall thickening or positive sonographic [name]'s sign. A small amount of ascites is  noted adjacent to the gallbladder. Common bile duct: Diameter: 4 mm Liver: The hepatic echotexture is heterogeneously increased. There is no discrete mass or ductal dilation. A small amount of ascites surrounds the liver. Portal vein is patent on color Doppler imaging with bidirectional flow. IVC: No abnormality visualized. Pancreas: The pancreatic parenchyma is grossly normal. The duct is dilated to 4 mm. No discrete pancreatic masses are observed. Spleen: The spleen is normal in size and echotexture. Right Kidney: Length: 11.2 cm. Echogenicity within normal limits. No mass or hydronephrosis visualized. Left Kidney: Length: 9.9 cm. Echogenicity within normal limits. No mass or hydronephrosis visualized. Abdominal aorta: There is no aortic aneurysm. There is mural plaque. Other findings: There is ascites as well as bilateral  pleural effusions. IMPRESSION: Increased hepatic echotexture without discrete mass. By directional flow in the portal vein consistent with portal hypertension. Mild dilation of the pancreatic duct 4 mm without evidence of pancreatic masses. Gallbladder sludge without discrete stones or sonographic evidence of acute cholecystitis. Electronically Signed   By: David  Martinique M.D.   On: 08/17/2018 09:06   Ct Abdomen Pelvis W Contrast  Result Date: 07/29/2018 CLINICAL DATA:  Abdominal pain, nausea, vomiting and diarrhea beginning this afternoon. History of substance abuse, hernia repair. EXAM: CT ABDOMEN AND PELVIS WITH CONTRAST TECHNIQUE: Multidetector CT imaging of the abdomen and pelvis was performed using the standard protocol following bolus administration of intravenous contrast. CONTRAST:  76m ISOVUE-300 IOPAMIDOL (ISOVUE-300) INJECTION 61% COMPARISON:  CT abdomen and pelvis June 21, 2018 FINDINGS: LOWER CHEST: Lung bases are clear. Stable cardiomegaly, reflux contrast consistent with RIGHT heart failure. No pericardial effusion. HEPATOBILIARY: Heterogeneous liver with enlarged IVC with contrast reflux consistent with passive hepatic congestion. Mild hepatomegaly. Normal gallbladder. PANCREAS: Too small to characterize hypodensity in spleen. SPLEEN: Normal. ADRENALS/URINARY TRACT: Kidneys are orthotopic, demonstrating symmetric enhancement. Multifocal scarring bilateral kidneys. No nephrolithiasis, hydronephrosis or solid renal masses. The unopacified ureters are normal in course and caliber. Delayed imaging through the kidneys demonstrates symmetric prompt contrast excretion within the proximal urinary collecting system. Urinary bladder is partially distended and unremarkable. Normal adrenal glands. STOMACH/BOWEL: The stomach, small and large bowel are normal in course and caliber without inflammatory changes. Mildly thickened: Consistent with ascites. Normal appendix. VASCULAR/LYMPHATIC: Aortoiliac  vessels are normal in course and caliber. Moderate calcific atherosclerosis. No lymphadenopathy by CT size criteria. REPRODUCTIVE: Normal. OTHER: Moderate ascites decreased from prior examination. No intraperitoneal free air. MUSCULOSKELETAL: Nonacute. Mild anasarca. Old RIGHT L3 and L4 transverse process fractures. RIGHT hip loose bodies. Moderate to severe LEFT hip osteoarthrosis. IMPRESSION: 1. No acute intra-abdominal/pelvic process. 2. Moderate ascites, decreased from prior examination. 3. Stable hepatomegaly with findings of passive hepatic congestion. Aortic Atherosclerosis (ICD10-I70.0). Electronically Signed   By: CElon AlasM.D.   On: 07/29/2018 00:33   Mr Femur Right W Wo Contrast  Result Date: 08/18/2018 CLINICAL DATA:  57y.o. female with medical history significant of Levamisole vasculitis, RA, anemia, migraine, HTN, depression, cocaine use who presents for bilateral LE pain. Bilateral AKA. Chronic pain for wounds of the lower extremity at the stumps. EXAM: MR OF THE LEFT LOWER EXTREMITY WITHOUT AND WITH CONTRAST MRI OF THE RIGHT FEMUR WITHOUT AND WITH CONTRAST TECHNIQUE: Multiplanar, multisequence MR imaging of the right lower extremity was performed both before and after administration of intravenous contrast. Multiplanar, multisequence MR imaging of the left lower extremity was performed both before and after administration of intravenous contrast. CONTRAST:  2 mL Gadavist COMPARISON:  None. FINDINGS: Bones/Joint/Cartilage Bilateral above the knee  amputation through the distal femoral diaphysis. No periosteal reaction or bone destruction. No aggressive osseous lesion. No T1 signal abnormality in bilateral femurs. No acute fracture or dislocation. Mild osteoarthritis of bilateral hips. Small bilateral hip joint effusions. Normal alignment. Ligaments, Muscles and Tendons Mild diffuse T2 hyperintense signal throughout the pelvic and femoral musculature without significant enhancement likely  neurogenic. No intramuscular drainable fluid collection or hematoma. Soft tissue Diffuse soft tissue edema throughout the subcutaneous fat of the pelvis and bilateral femurs which may be secondary to anasarca or fluid overload versus severe cellulitis. T2 hyperintense well-circumscribed fluid collection adjacent to the pectineus muscle bilaterally without enhancement unchanged compared with prior CT abdomen dated 06/21/2018. No other drainable fluid collection. Moderate pelvic free fluid. IMPRESSION: 1. Bilateral above the knee amputation through the distal femoral diaphysis without evidence of osteomyelitis. 2. Diffuse soft tissue edema throughout the subcutaneous fat of the pelvis and bilateral femurs which may be secondary to anasarca. No drainable fluid collection to suggest an abscess. 3. Mild osteoarthritis of bilateral hips with bilateral hip joint effusions. Electronically Signed   By: Kathreen Devoid   On: 08/18/2018 08:34   Mr Femur Left W Wo Contrast  Result Date: 08/18/2018 CLINICAL DATA:  57 y.o. female with medical history significant of Levamisole vasculitis, RA, anemia, migraine, HTN, depression, cocaine use who presents for bilateral LE pain. Bilateral AKA. Chronic pain for wounds of the lower extremity at the stumps. EXAM: MR OF THE LEFT LOWER EXTREMITY WITHOUT AND WITH CONTRAST MRI OF THE RIGHT FEMUR WITHOUT AND WITH CONTRAST TECHNIQUE: Multiplanar, multisequence MR imaging of the right lower extremity was performed both before and after administration of intravenous contrast. Multiplanar, multisequence MR imaging of the left lower extremity was performed both before and after administration of intravenous contrast. CONTRAST:  2 mL Gadavist COMPARISON:  None. FINDINGS: Bones/Joint/Cartilage Bilateral above the knee amputation through the distal femoral diaphysis. No periosteal reaction or bone destruction. No aggressive osseous lesion. No T1 signal abnormality in bilateral femurs. No acute  fracture or dislocation. Mild osteoarthritis of bilateral hips. Small bilateral hip joint effusions. Normal alignment. Ligaments, Muscles and Tendons Mild diffuse T2 hyperintense signal throughout the pelvic and femoral musculature without significant enhancement likely neurogenic. No intramuscular drainable fluid collection or hematoma. Soft tissue Diffuse soft tissue edema throughout the subcutaneous fat of the pelvis and bilateral femurs which may be secondary to anasarca or fluid overload versus severe cellulitis. T2 hyperintense well-circumscribed fluid collection adjacent to the pectineus muscle bilaterally without enhancement unchanged compared with prior CT abdomen dated 06/21/2018. No other drainable fluid collection. Moderate pelvic free fluid. IMPRESSION: 1. Bilateral above the knee amputation through the distal femoral diaphysis without evidence of osteomyelitis. 2. Diffuse soft tissue edema throughout the subcutaneous fat of the pelvis and bilateral femurs which may be secondary to anasarca. No drainable fluid collection to suggest an abscess. 3. Mild osteoarthritis of bilateral hips with bilateral hip joint effusions. Electronically Signed   By: Kathreen Devoid   On: 08/18/2018 08:34   Dg Chest Port 1 View  Result Date: 08/20/2018 CLINICAL DATA:  Hypoxia EXAM: PORTABLE CHEST 1 VIEW COMPARISON:  08/15/2018 FINDINGS: 1501 hours. Interval development of diffuse micro nodular opacity in both lungs with an upper lobe predominance. Cardiopericardial silhouette is enlarged. No substantial pleural effusion. Nodular opacity overlying the right base is similar to a nodular opacity along the left chest wall, likely representing nipple shadows. The visualized bony structures of the thorax are intact. Telemetry leads overlie the chest. IMPRESSION: Cardiomegaly  with diffuse interstitial and micro nodular opacity with an upper lung predominance. Imaging features are compatible with diffuse infection or pulmonary  edema. Electronically Signed   By: Misty Stanley M.D.   On: 08/20/2018 15:19   Dg Chest Port 1 View  Result Date: 08/15/2018 CLINICAL DATA:  Follow-up pneumonia EXAM: PORTABLE CHEST 1 VIEW COMPARISON:  08/13/2018 FINDINGS: Cardiac shadow remains enlarged. Aortic calcifications are again seen. The lungs are well aerated bilaterally. Some patchy atelectatic changes are noted in the right mid lung and left lung base slightly increased when compared with the prior exam. Previously seen nipple shadows are visualized more inferiorly. No sizable effusion is seen. No bony abnormality is noted. IMPRESSION: Patchy bilateral atelectasis. Electronically Signed   By: Inez Catalina M.D.   On: 08/15/2018 07:39   Dg Chest Port 1 View  Result Date: 08/13/2018 CLINICAL DATA:  Initial evaluation for acute fever. EXAM: PORTABLE CHEST 1 VIEW COMPARISON:  Prior radiograph from 07/30/2018. FINDINGS: Cardiomegaly, unchanged. Mediastinal silhouette within normal limits. Aortic atherosclerosis. Lungs mildly hypoinflated. Mild diffuse pulmonary interstitial edema. No appreciable pleural effusion. 19 mm nodular density overlying the mid-lower left lung most likely reflects a nipple shadow. Similar shadow seen overlying the lateral margin of the right hemithorax. No other focal infiltrates. No pneumothorax. No acute osseous abnormality. IMPRESSION: 1. Cardiomegaly with mild diffuse pulmonary interstitial congestion/edema. 2. No other active cardiopulmonary disease.  No focal infiltrates. 3. Nodular densities overlying the bilateral lung bases, most consistent with nipple shadows. 4. Aortic atherosclerosis. Electronically Signed   By: Jeannine Boga M.D.   On: 08/13/2018 03:20   Dg Femur Port 1v Left  Result Date: 08/13/2018 CLINICAL DATA:  Initial evaluation for acute fever. EXAM: LEFT FEMUR PORTABLE 1 VIEW COMPARISON:  Recent radiograph from 07/30/2018. FINDINGS: Patient status post AKA. Subtle periosteal reaction along the  amputated margin of the left femur, similar to previous exam. Additionally, there is subtle osseous erosion at the amputated margin, not definitely seen on previous, suspicious for possible acute osteomyelitis. Overlying soft tissue irregularity without soft tissue emphysema. No acute fracture or dislocation. Osteoarthritic changes noted about the left hip. IMPRESSION: Subtle periosteal reaction with osseous erosion at the amputated margin of the left femoral AKA, suspicious for possible acute osteomyelitis. Further evaluation and confirmation could be performed with follow-up MRI as clinically warranted. Electronically Signed   By: Jeannine Boga M.D.   On: 08/13/2018 04:46   Dg Femur Port, 1v Right  Result Date: 08/13/2018 CLINICAL DATA:  Initial evaluation for acute fever, evaluate for infection. EXAM: RIGHT FEMUR PORTABLE 1 VIEW COMPARISON:  Prior radiograph from 08/09/2018. FINDINGS: Patient is status post AKA. Subtle periosteal reaction along the medial margin of the amputated femur again seen, stable from previous. No interval osseous erosion. Mild overlying soft tissue irregularity. No radiopaque foreign body or soft tissue emphysema. No acute fracture or dislocation. Sclerotic lesion within the right femoral neck noted, unchanged. IMPRESSION: Similar appearance of right AKA with subtle periosteal reaction at the distal right femur, stable relative to recent radiograph from 08/09/2018. No interval osseous erosion or definite evidence for acute osteomyelitis. Again, further evaluation with dedicated MRI could be performed if there is clinical concern for ongoing osteomyelitis. Electronically Signed   By: Jeannine Boga M.D.   On: 08/13/2018 04:43   Dg Femur Min 2 Views Left  Result Date: 07/30/2018 CLINICAL DATA:  Patient found laying on a bench at a bus stop with pain in the legs. Erythema and swelling noted at site of  amputation. EXAM: LEFT FEMUR 2 VIEWS COMPARISON:  None. FINDINGS:  Periosteal new bone formation is noted about the tip of the remaining left femoral diaphysis status post above knee amputation. Findings raise concern for changes of osteomyelitis. Osteoarthritis of the left hip with moderate-to-marked joint space narrowing is identified. No acute fracture. IMPRESSION: Periosteal new bone formation about the tip of the remaining femoral diaphysis is noted. Findings raise concern for osteomyelitis. Electronically Signed   By: Ashley Royalty M.D.   On: 07/30/2018 18:04   Dg Femur Min 2 Views Right  Result Date: 08/09/2018 CLINICAL DATA:  Stump pain with skin breakdown EXAM: RIGHT FEMUR 2 VIEWS COMPARISON:  07/30/2018 radiography. FINDINGS: Above the knee amputation with stable overlying thin soft tissue flap. No opaque foreign body or soft tissue gas. Heterotopic ossification about the osteotomy superimposed mild periosteal reaction has occurred since prior. There is osteopenia of the stump that is stable. IMPRESSION: No definite acute finding or osteomyelitis. There is subtle periosteal reaction that has occurred since 07/30/2018, a MRI could be obtained if there is ongoing concern for osteomyelitis. Electronically Signed   By: Monte Fantasia M.D.   On: 08/09/2018 11:14   Dg Femur Min 2 Views Right  Result Date: 07/30/2018 CLINICAL DATA:  Pt BIB EMS found [place] bench [place] bus stop. Pt reports severe pain in both legs. Redness and swelling noted to the sites on both legs. Pt has bilateral AKA amputations. Hx of HTN and community acquired pneumonia. EXAM: RIGHT FEMUR 2 VIEWS COMPARISON:  None. FINDINGS: Previous amputation across the midshaft of the right femur. Amputated margin appears well defined no focal bone resorption is seen to suggest osteomyelitis. There is no acute fracture. Hip joint is normally spaced and aligned. No soft tissue air. IMPRESSION: 1. No fracture.  No evidence of osteomyelitis.  No acute finding. Electronically Signed   By: Lajean Manes M.D.    On: 07/30/2018 18:02   US Abdomen Limited Ruq  Result Date: 07/30/2018 CLINICAL DATA:  Elevated liver enzymes EXAM: ULTRASOUND ABDOMEN LIMITED RIGHT UPPER QUADRANT COMPARISON:  July 29, 2018 CT abdomen and pelvis FINDINGS: Gallbladder: No gallstones are evident. Gallbladder wall is slightly thickened, a finding that may be due to ascites which is present. There is no appreciable pericholecystic fluid. No sonographic [name] sign noted by sonographer. Common bile duct: Diameter: 4 mm. No intrahepatic or extrahepatic biliary duct dilatation. Liver: No focal lesion identified. Within normal limits in parenchymal echogenicity. Portal vein is patent on color Doppler imaging with normal direction of blood flow towards the liver. Ascites noted. IMPRESSION: 1.  Relatively mild ascites noted. 2. Gallbladder wall slightly thickened. This is a finding that may be secondary to ascites. No gallstones or pericholecystic fluid seen. It should be noted that acalculus cholecystitis could present in this manner. In this regard, it may be prudent to correlate with nuclear medicine hepatobiliary imaging study to assess for cystic duct patency. 3. No focal liver lesions are appreciable. The changes suggesting chronic passive congestion on recent CT within the liver are not demonstrated by ultrasound. Electronically Signed   By: Lowella Grip III M.D.   On: 07/30/2018 21:52      Subjective: Patient seen and examined at bedside.  She is a poor historian.  Denies any new complaints.  No overnight fever or vomiting  Discharge Exam: Vitals:   08/24/18 1936 08/25/18 0355  BP: 101/64 (!) 153/87  Pulse: 99 99  Resp: 19 19  Temp: 98.2 F (36.8  C) 97.9 F (36.6 C)  SpO2: 99% 100%   Vitals:   08/24/18 0906 08/24/18 1252 08/24/18 1936 08/25/18 0355  BP: 121/74 125/80 101/64 (!) 153/87  Pulse:  98 99 99  Resp:  16 19 19   Temp:  98.1 F (36.7 C) 98.2 F (36.8 C) 97.9 F (36.6 C)  TempSrc:  Oral Oral Oral  SpO2:   99% 99% 100%  Weight:      Height:        General: Pt is alert, awake, not in acute distress.  Poor historian.   Cardiovascular: rate controlled, S1/S2 + Respiratory: bilateral decreased breath sounds at bases Abdominal: Soft, NT, ND, bowel sounds + Extremities: Bilateral AKA with ulceration.  No edema    The results of significant diagnostics from this hospitalization (including imaging, microbiology, ancillary and laboratory) are listed below for reference.     Microbiology: No results found for this or any previous visit (from the past 240 hour(s)).   Labs: BNP (last 3 results) Recent Labs    05/19/18 2116 06/20/18 2239  BNP 3,603.1* 3,810.1*   Basic Metabolic Panel: Recent Labs  Lab 08/20/18 0639 08/21/18 0345 08/22/18 0339 08/23/18 0321  NA 137 137 135 135  K 3.4* 4.3 3.5 4.3  CL 107 105 101 98  CO2 23 23 26 28   GLUCOSE 78 92 85 96  BUN 10 13 12 14   CREATININE 0.79 0.89 0.70 0.70  CALCIUM 8.0* 8.7* 8.6* 9.1   Liver Function Tests: Recent Labs  Lab 08/20/18 0639  AST 51*  ALT 173*  ALKPHOS 90  BILITOT 4.5*  PROT 5.4*  ALBUMIN 2.1*   No results for input(s): LIPASE, AMYLASE in the last 168 hours. No results for input(s): AMMONIA in the last 168 hours. CBC: Recent Labs  Lab 08/20/18 0639 08/21/18 0345 08/22/18 0339 08/23/18 0321  WBC 11.5* 12.8* 11.6* 11.0*  HGB 10.4* 10.0* 10.1* 10.1*  HCT 33.9* 33.5* 34.0* 33.9*  MCV 84.1 83.3 83.1 82.5  PLT 153 216 232 275   Cardiac Enzymes: No results for input(s): CKTOTAL, CKMB, CKMBINDEX, TROPONINI in the last 168 hours. BNP: Invalid input(s): POCBNP CBG: No results for input(s): GLUCAP in the last 168 hours. D-Dimer No results for input(s): DDIMER in the last 72 hours. Hgb A1c No results for input(s): HGBA1C in the last 72 hours. Lipid Profile No results for input(s): CHOL, HDL, LDLCALC, TRIG, CHOLHDL, LDLDIRECT in the last 72 hours. Thyroid function studies No results for input(s): TSH,  T4TOTAL, T3FREE, THYROIDAB in the last 72 hours.  Invalid input(s): FREET3 Anemia work up No results for input(s): VITAMINB12, FOLATE, FERRITIN, TIBC, IRON, RETICCTPCT in the last 72 hours. Urinalysis    Component Value Date/Time   COLORURINE AMBER (A) 08/13/2018 0552   APPEARANCEUR CLEAR 08/13/2018 0552   LABSPEC 1.017 08/13/2018 0552   PHURINE 5.0 08/13/2018 0552   GLUCOSEU NEGATIVE 08/13/2018 0552   HGBUR MODERATE (A) 08/13/2018 0552   BILIRUBINUR NEGATIVE 08/13/2018 0552   BILIRUBINUR small 04/29/2015 1533   KETONESUR 5 (A) 08/13/2018 0552   PROTEINUR 100 (A) 08/13/2018 0552   UROBILINOGEN 1.0 07/26/2017 1550   NITRITE NEGATIVE 08/13/2018 0552   LEUKOCYTESUR NEGATIVE 08/13/2018 0552   Sepsis Labs Invalid input(s): PROCALCITONIN,  WBC,  LACTICIDVEN Microbiology No results found for this or any previous visit (from the past 240 hour(s)).   Time coordinating discharge: 35 minutes  SIGNED:   Aline August, MD  [facility] 08/25/2018, 9:27 AM Pager: [phone]  If 7PM-7AM, please contact night-coverage  [email or web address] Password [email or web address]

## 2018-08-25 NOTE — Care Management Note (Signed)
Case Management Note Cristina Gibbons RN, BSN Transitions of Care Unit 4E- RN Case Manager [phone]  Patient Details  Name: [name] MRN: [id number] Date of Birth: 1960/11/27  Subjective/Objective:  Pt admitted with sepsis, infected AKA stumps,                   Action/Plan: PTA pt homeless, CSW consulted for possible placement, hx of active drug use. CSW and CM has spoken with pt regarding transition plans, pt is not willing to go to brother's house, and has not agreed to go to a SNF at this time. Pt also has not provided CM or CSW with an address or phone # for any other friend or family member that she plans to go home with. Pt was admitted without her w/c and pt does not recall where her w/c is at this time. Have spoken with Butch Penny at [facility] and per conversation [facility] was working on getting pt a power chair but has not been able to contact pt since last discharge- pt apparently had a loaner chair. [facility] will be able to provide pt a manual w/c with MD order at time of discharge but will need an address and phone # that they can contact pt at to f/u on power chair that is pending for pt. W/c order has been placed by MD- CM will f/u with pt for address and phone # that can be used to contact her. CSW to f/u with pt for transition plan. Will have w/c delivered to bedside on 11/20 once approved.   Expected Discharge Date:  08/24/18               Expected Discharge Plan:  [place] (pt has declined)  In-House Referral:  Clinical Social Work  Discharge planning Services  CM Consult  Post Acute Care Choice:  Durable Medical Equipment Choice offered to:  Patient  DME Arranged:  Programmer, multimedia DME Agency:  [place]:    [place] Agency:     Status of Service:  In process, will continue to follow  If discussed at Long Length of Stay Meetings, dates discussed:    Discharge Disposition:    Additional Comments:  08/25/18- 1030- Cristina Klingensmith RN, CM-  CSW has provided pt with taxi voucher to [facility], w/c at the bedside, plan to transition pt to [facility] where she can search for shelter bed as pt has declined placement and has not provided CSW or staff with any other address or place to go. 73- NT came to CM to inform that pt now wants to go to a facility- CM went to bedside to speak with pt- who is tearful but now states she will go to facility, discussed with pt once again that she will have to give up SSI check and stay at facility for 30 days. Pt states that she is agreeable to this now- will have CSW come to see pt again for placement needs. Spoke with pt that SNF placement would be her best and safest plan for wound healing- she agrees at this time to placement.   08/24/18- 1530- Cristina Gibbons RN CM- [facility] has delivered pt's w/c to bedside, per CSW all shelters are full, have had a conversation with pt regarding discharge- pt still not agreeable to going to brother's house even though bedside RN has spoken with brother today via TC and he states he is agreeable to have pt come stay with him. Pt also is  adamant about not going to SNF and giving up her SSI check- states she will go to the streets. Plan will be to transition pt in am with taxi voucher to [facility], per pt she will get her medications at [facility] and does not desire to use [facility] pharmacy here. CSW to see pt in am with taxi voucher. CM has notified [facility] who agrees with plan.  Pt did provide CSW with brother's address for [facility] to f/u on wheelchair needs as point of contact- [name] [id number] phone# [phone]    [name], [place], RN 08/25/2018, 2:32 PM

## 2018-08-25 NOTE — Plan of Care (Signed)
  Problem: Activity: Goal: Risk for activity intolerance will decrease Outcome: Not Progressing   Problem: Nutrition: Goal: Adequate nutrition will be maintained Outcome: Not Progressing   Problem: Coping: Goal: Level of anxiety will decrease Outcome: Not Progressing

## 2018-08-25 NOTE — Clinical Social Work Note (Signed)
Patient medically stable for discharge and initially had adamantly refused SNF, as she is aware that the facility will take her SSI check. However today patient in agreement with SNF and facility search initiated. CSW working and placement and will facilitate discharge to a skilled nursing facility once an accepting facility located.  Daryon Remmert Givens, MSW, LCSW Licensed Clinical Social Worker [place] [phone]

## 2018-08-25 NOTE — NC FL2 (Signed)
[place] LEVEL OF CARE SCREENING TOOL     IDENTIFICATION  Patient Name: [name] Birthdate: 1961/05/14 Sex: female Admission Date (Current Location): 08/13/2018  [place] and [place] Number:  Kathleen Argue [id number] [place] and Address:  [facility] [place], [place] [address], [place], [place] [address]      Provider Number: [id number]  Attending Physician Name and Address:  Aline August, MD  Relative Name and Phone Number:  Rolm Bookbinder - brother, [phone]    Current Level of Care: Hospital Recommended Level of Care: [place] Prior Approval Number:    Date Approved/Denied:   PASRR Number: [id number]  Discharge Plan: SNF    Current Diagnoses: Patient Active Problem List   Diagnosis Date Noted  . Atherosclerosis of native arteries of extremities with gangrene, left leg ([place])   . Thrombocytopenia ([place]) 08/20/2018  . Toxic encephalopathy 08/20/2018  . Goals of care, counseling/discussion   . Palliative care by specialist   . DNR (do not resuscitate) discussion   . Infiltrate noted on imaging study   . Ulcer of right thigh, limited to breakdown of skin ([place])   . S/P bilateral above knee amputation ([place])   . Homelessness 07/30/2018  . Elevated LFTs 07/30/2018  . Osteomyelitis ([place]) 07/30/2018  . Acute encephalopathy 07/30/2018  . Eye swelling, bilateral 07/30/2018  . Skin rash 07/11/2018  . Acute on chronic diastolic CHF (congestive heart failure) ([place]) 06/21/2018  . Malnutrition of moderate degree 05/20/2018  . Acute congestive heart failure ([place])   . Suicidal ideation 02/02/2018  . HCAP (healthcare-associated pneumonia) 02/02/2018  . Acute on chronic respiratory failure with hypoxia ([place]) 02/02/2018  . Sepsis ([place]) 02/02/2018  . Ulcer of amputation stump of lower extremity ([place]) 10/23/2017  . Cocaine abuse with cocaine-induced mood disorder ([place]) 08/10/2017  . Hypertensive crisis   . Phantom limb pain ([place])    . S/P bilateral below knee amputation ([place]) 04/13/2017  . Tobacco abuse   . Post-operative pain   . Acute blood loss anemia   . Atherosclerosis of native arteries of extremities with gangrene, bilateral legs ([place])   . Atherosclerosis of native arteries of extremities with gangrene, right leg ([place])   . Wound infection 03/30/2017  . AKI (acute kidney injury) ([place]) 02/07/2017  . Acute kidney injury ([place]) 02/06/2017  . Wound healing, delayed   . Chest pain 04/07/2015  . Protein-calorie malnutrition, severe ([place]) 01/15/2015  . MDD (major depressive disorder), recurrent episode, severe ([place]) 10/16/2014  . Cocaine use disorder, severe, dependence ([place]) 10/10/2014  . 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 Essential hypertension 02/26/2010    [place] RESPIRATION BLADDER Height & Weight     Self, Time, Situation, Place  Normal Incontinent, External catheter(Catheter placed 08/17/18) Weight: 103 lb 9.9 oz (47 kg) Height:  5' 1"  (154.9 cm)  BEHAVIORAL SYMPTOMS/MOOD NEUROLOGICAL BOWEL NUTRITION STATUS      Continent Diet(Low sodium - Heart healthy)  AMBULATORY STATUS COMMUNICATION OF NEEDS Skin   Total Care(Bilateral AKS) Verbally Other (Comment)(Abrasions to thigh, sacrum & buttocis with foam dressing; Celluitis arm & leg; Stage 2 pressure injury to Coccyx with foam dressing; Wound left AKA-black & painful; Wound right AKA w/ABD dressing, painful, pink & yellow)                       Personal Care Assistance Level of Assistance              Functional Limitations Info  Sight, Hearing,  Speech Sight Info: Adequate Hearing Info: Adequate Speech Info: Adequate    SPECIAL CARE FACTORS FREQUENCY  PT (By licensed PT)     PT Frequency: PT at SNF Eval & Treat              Contractures Contractures Info: Not present    Additional Factors Info  Code Status, Allergies Code Status Info: Full Allergies Info: Acetaminophen, Haldol, Lisinopril, Toradol            Current Medications (08/25/2018):  This is the current hospital active medication list Current Facility-Administered Medications  Medication Dose Route Frequency Provider Last Rate Last Dose  . acetaminophen (TYLENOL) tablet 325 mg  325 mg Oral Q6H PRN Joette Catching T, MD      . amLODipine (NORVASC) tablet 10 mg  10 mg Oral Daily Cherene Altes, MD   10 mg at 08/25/18 1035  . chlorpheniramine-HYDROcodone (TUSSIONEX) 10-8 MG/5ML suspension 5 mL  5 mL Oral Q12H Rizwan, Saima, MD   5 mL at 08/25/18 1036  . enoxaparin (LOVENOX) injection 30 mg  30 mg Subcutaneous Q24H Debbe Odea, MD   30 mg at 08/23/18 1532  . feeding supplement (ENSURE ENLIVE) (ENSURE ENLIVE) liquid 237 mL  237 mL Oral BID BM Buriev, Arie Sabina, MD   237 mL at 08/25/18 1037  . furosemide (LASIX) tablet 40 mg  40 mg Oral Daily Debbe Odea, MD   40 mg at 08/25/18 1035  . ondansetron (ZOFRAN) tablet 4 mg  4 mg Oral Q6H PRN Sid Falcon, MD       Or  . ondansetron [facility]) injection 4 mg  4 mg Intravenous Q6H PRN Gilles Chiquito B, MD      . oxyCODONE (Oxy IR/ROXICODONE) immediate release tablet 5-10 mg  5-10 mg Oral Q4H PRN Kinnie Feil, MD   10 mg at 08/23/18 1644  . polyethylene glycol (MIRALAX / GLYCOLAX) packet 17 g  17 g Oral Daily Kinnie Feil, MD   17 g at 08/25/18 1036  . traMADol (ULTRAM) tablet 50 mg  50 mg Oral Q6H PRN Cherene Altes, MD   50 mg at 08/19/18 2021     Discharge Medications: Please see discharge summary for a list of discharge medications.  Relevant Imaging Results:  Relevant Lab Results:   Additional Information ss#[id number].  Wound information continued: Wound thigh, painful, pink & yellow with serous drainage  Sharlet Salina Mila Homer, LCSW

## 2018-08-25 NOTE — Progress Notes (Signed)
Nutrition Follow Up  DOCUMENTATION CODES:   Non-severe (moderate) malnutrition in context of social or environmental circumstances  INTERVENTION:    Ensure Enlive po BID, each supplement provides 350 kcal and 20 grams of protein  NUTRITION DIAGNOSIS:   Moderate Malnutrition related to social / environmental circumstances(limited access to food) as evidenced by energy intake < or equal to 75% for > or equal to 1 month, moderate fat depletion, mild muscle depletion, ongoing  GOAL:   Patient will meet greater than or equal to 90% of their needs, progressing  MONITOR:   PO intake, Supplement acceptance, Labs, Skin, Weight trends, I & O's  ASSESSMENT:   57 y.o. Female with a hx of Levimisole vasculitis, RA, anemia, migraine, HTN, depression, substance abuse, cocaine use who presented for B LE pain, draiange.  Pt continues on a Heart Healthy diet. PO intake rather poor at 25% per flowsheets. Per MAR, drinking some of her Ensure Enlive supplements. Palliative Medicine notes reviewed.  Pt is medically stable. Discharge plan is now SNF placement.  Diet Order:   Diet Order            Diet Heart Room service appropriate? Yes; Fluid consistency: Thin  Diet effective now        Diet - low sodium heart healthy        Diet - low sodium heart healthy             EDUCATION NEEDS:   Not appropriate for education at this time  Skin:  Skin Assessment: Skin Integrity Issues: Skin Integrity Issues:: Other (Comment) Other: full thickness wounds over previous surgical sites to AKA stumps  Last BM:  unknown  Height:   Ht Readings from Last 1 Encounters:  08/18/18 5' 1"  (1.549 m)   Weight:   Wt Readings from Last 1 Encounters:  08/18/18 47 kg   BMI:  24.8 kg/m2 (adjusted for bilateral AKA's)  Estimated Nutritional Needs:   Kcal:  1400-1600  Protein:  60-75 gm  Fluid:  >/= 1.5 L  Arthur Holms, RD, LDN Pager #: [phone] After-Hours Pager #: [phone]

## 2018-08-26 NOTE — Progress Notes (Signed)
Report called to receiving nurse at [facility].  Amanda Cockayne, RN

## 2018-08-26 NOTE — Progress Notes (Signed)
Patient pickup for transport to [facility]. Vitals taken, patient stable with no pain complaint.

## 2018-08-26 NOTE — Progress Notes (Addendum)
[name], female   DOB: May 29, 1961, 57 y.o.   MRN: [id number] Patient was supposed to be discharged to shelter on 08/25/2018 but she changed her mind and now wants to go to SNF.  She was being prepared for SNF discharge earlier but she had denied it and wanted to go to a shelter.  Patient seen at bedside, she is sleeping, hardly wakes up on calling her name.  She is medically stable for discharge.  Palliative care following but patient is still full code.  Overall prognosis is poor.  Please refer to the discharge summary done by me on 08/25/2018 for full details.

## 2018-08-30 ENCOUNTER — Encounter: Payer: Self-pay | Admitting: Internal Medicine

## 2018-08-30 ENCOUNTER — Non-Acute Institutional Stay (SKILLED_NURSING_FACILITY): Payer: Medicaid Other | Admitting: Internal Medicine

## 2018-08-30 DIAGNOSIS — A419 Sepsis, unspecified organism: Secondary | ICD-10-CM | POA: Diagnosis not present

## 2018-08-30 DIAGNOSIS — Z89512 Acquired absence of left leg below knee: Secondary | ICD-10-CM

## 2018-08-30 DIAGNOSIS — I10 Essential (primary) hypertension: Secondary | ICD-10-CM | POA: Diagnosis not present

## 2018-08-30 DIAGNOSIS — D509 Iron deficiency anemia, unspecified: Secondary | ICD-10-CM | POA: Diagnosis not present

## 2018-08-30 DIAGNOSIS — R6520 Severe sepsis without septic shock: Secondary | ICD-10-CM

## 2018-08-30 DIAGNOSIS — Z89511 Acquired absence of right leg below knee: Secondary | ICD-10-CM

## 2018-08-30 DIAGNOSIS — Z9189 Other specified personal risk factors, not elsewhere classified: Secondary | ICD-10-CM

## 2018-08-30 DIAGNOSIS — N179 Acute kidney failure, unspecified: Secondary | ICD-10-CM

## 2018-08-30 NOTE — Assessment & Plan Note (Signed)
Dr. Sharol Given will arrange for prostheses once wound stumps have healed adequately

## 2018-08-30 NOTE — Assessment & Plan Note (Signed)
Dr. Sharol Given did not feel that active cellulitis was present and antibiotics were discontinued

## 2018-08-30 NOTE — Assessment & Plan Note (Addendum)
Blood pressure is actually low, amlodipine will be decreased to 5 mg daily and blood pressure monitored

## 2018-08-30 NOTE — Assessment & Plan Note (Signed)
08/30/2018 no bleeding dyscrasias reported; hemoglobin 10.1/hematocrit 33.9

## 2018-08-30 NOTE — Patient Instructions (Signed)
See assessment and plan under each diagnosis in the problem list and acutely for this visit 

## 2018-08-30 NOTE — Assessment & Plan Note (Signed)
Narcotics as per [facility] or Surgeon

## 2018-08-30 NOTE — Progress Notes (Signed)
NURSING HOME LOCATION:  [facility] ROOM NUMBER:  [id number]  CODE STATUS:  Full Code  PCP:  Azzie Glatter, [place] [id number]  This is a comprehensive admission note to [facility] performed on this date less than 30 days from date of admission. Included are preadmission medical/surgical history; reconciled medication list; family history; social history and comprehensive review of systems.  Corrections and additions to the records were documented. Comprehensive physical exam was also performed. Additionally a clinical summary was entered for each active diagnosis pertinent to this admission in the Problem List to enhance continuity of care.  HPI: The patient was hospitalized 11/19-11/21/2019, admitted from a shelter as she is homeless.  She presented with bilateral lower extremity pain and drainage with sepsis, antibiotics were initiated.  This is in the context of multiple recent admissions for cellulitis of the stumps. She has had bilateral AKA's for peripheral vascular disease.  Dr. Sharol Given recommended continuing wound dressing changes without any additional revision surgery.  Course was complicated by acute kidney injury felt to be prerenal.  This resolved with hydration.  She had acute on chronic diastolic congestive heart failure resulting in acute respiratory failure.  She received IV Lasix which was transitioned to oral Lasix. Originally 11/21 she was to go to the SNF for wound care but she refused and plans were made to discharge to a shelter.  Subsequently discharge was changed to SNF.  Past medical and surgical history: Levamisole-induced vasculitis, substance abuse (cocaine), depression, rheumatoid arthritis, cryoglobulinemia, hypertension, anemia, and migraines.  She also has severe protein/caloric malnutrition.  Surgeries multiple I&D's and debridements as well as the amputations described in the physical.   She validates that she had diffuse  swelling with Tylenol yet this is listed on her med list. Palliative Care has evaluated the patient; but she remains a full code. Social history: Nondrinker.  Current smoker prior to admission.  She began smoking at age 26 and smoked 2-3 packs/day until her 50s.  In early 80s she decreased her smoking consumption to 2-3 cigarettes a day because of the cost.  Family history: Positive for alcohol abuse in both parents.   Review of systems: She denies any bleeding dyscrasias.  She has occasional dysphagia.  She also describes occasional depression.  She has had intermittent yellow pus from the stumps.  She has not been able to have prostheses made because of the recurrent infections of the stumps.  Constitutional: No fever, significant weight change, fatigue  Eyes: No redness, discharge, pain, vision change ENT/mouth: No nasal congestion, purulent discharge, earache, change in hearing, sore throat  Cardiovascular: No chest pain, palpitations, paroxysmal nocturnal dyspnea, claudication, edema  Respiratory: No cough, sputum production, hemoptysis, DOE, significant snoring, apnea Gastrointestinal: No heartburn, dysphagia, abdominal pain, nausea /vomiting, rectal bleeding, melena, change in bowels Genitourinary: No dysuria, hematuria, pyuria, incontinence, nocturia Musculoskeletal: No joint stiffness, joint swelling, weakness, pain Dermatologic: No rash, pruritus Neurologic: No dizziness, headache, syncope, seizures, numbness, tingling Endocrine: No change in hair/skin/nails, excessive thirst, excessive hunger, excessive urination  Hematologic/lymphatic: No significant bruising, lymphadenopathy, abnormal bleeding Allergy/immunology: No itchy/watery eyes, significant sneezing, urticaria, angioedema  Physical exam:  Pertinent or positive findings: She does appear suboptimally nourished.  She is edentulous.  Grade 1 systolic murmur is present in the left sternal border.  She has scattered low-grade rales  on auscultation of the chest.  There are isolated finger deformities with some swan-neck deformities of rheumatoid arthritis.  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  She has bilateral AKA's.  Strength is surprisingly good in the upper extremities to opposition.  General appearance: no acute distress, increased work of breathing is present.   Lymphatic: No lymphadenopathy about the head, neck, axilla. Eyes: No conjunctival inflammation or lid edema is present. There is no scleral icterus. Ears:  External ear exam shows no significant lesions or deformities.   Nose:  External nasal examination shows no deformity or inflammation. Nasal mucosa are pink and moist without lesions, exudates Oral exam: Lips and gums are healthy appearing.There is no oropharyngeal erythema or exudate. Neck:  No thyromegaly, masses, tenderness noted.    Heart:  Normal rate and regular rhythm. S1 and S2 normal without gallop, click, rub.  Abdomen: Bowel sounds are normal.  Abdomen is soft and nontender with no organomegaly, hernias, masses. GU: Deferred  Extremities:  No cyanosis,  edema. Neurologic exam:  Balance, Rhomberg, finger to nose testing could not be completed due to clinical state Skin: Warm & dry w/o tenting. No significant lesions or rash.  See clinical summary under each active problem in the Problem List with associated updated therapeutic plan

## 2018-09-05 ENCOUNTER — Other Ambulatory Visit: Payer: Self-pay

## 2018-09-05 MED ORDER — TRAMADOL HCL 50 MG PO TABS: 100.0000 mg | ORAL_TABLET | Freq: Four times a day (QID) | ORAL | 0 refills | Status: DC

## 2018-09-05 NOTE — Telephone Encounter (Signed)
Hard script written by Durenda Age, NP.  CMA faxed to pharmacy.  Original Rx and faxed confirmation given to nurse.

## 2018-09-13 ENCOUNTER — Other Ambulatory Visit: Payer: Self-pay

## 2018-09-13 ENCOUNTER — Encounter: Payer: Self-pay | Admitting: Internal Medicine

## 2018-09-13 ENCOUNTER — Non-Acute Institutional Stay (SKILLED_NURSING_FACILITY): Payer: Medicaid Other | Admitting: Adult Health

## 2018-09-13 DIAGNOSIS — I10 Essential (primary) hypertension: Secondary | ICD-10-CM | POA: Diagnosis not present

## 2018-09-13 DIAGNOSIS — G8929 Other chronic pain: Secondary | ICD-10-CM

## 2018-09-13 DIAGNOSIS — I5032 Chronic diastolic (congestive) heart failure: Secondary | ICD-10-CM

## 2018-09-13 MED ORDER — OXYCODONE HCL 5 MG PO TABS: 5.0000 mg | ORAL_TABLET | ORAL | 0 refills | Status: DC | PRN

## 2018-09-13 NOTE — Telephone Encounter (Signed)
Hard script written by Durenda Age, NP and given to nurse.

## 2018-09-13 NOTE — Addendum Note (Signed)
Addended by: Bonney Leitz T on: 09/13/2018 04:19 PM   Modules accepted: Orders

## 2018-09-13 NOTE — Progress Notes (Signed)
Location:  [place] Room Number: 225-B Place of Service:  SNF (31) Provider:  Durenda Age, NP  Patient Care Team: Azzie Glatter, FNP as PCP - General (Family Medicine)  Extended Emergency Contact Information Primary Emergency Contact: McNair,[name]          [place] Mobile Phone: [phone] Relation: Brother  Code Status:  Full Code  Goals of care: Advanced Directive information Advanced Directives 08/20/2018  Does Patient Have a Medical Advance Directive? No  Type of Advance Directive -  Does patient want to make changes to medical advance directive? -  Would patient like information on creating a medical advance directive? No - Patient declined  Pre-existing out of facility DNR order (yellow form or pink MOST form) -  Some encounter information is confidential and restricted. Go to Review Flowsheets activity to see all data.    Chief Complaint  Patient presents with  . Acute Visit    Patient seen for medication management.    HPI:  Pt is a 57 y.o. female seen today for weekly  medical management of chronic diseases. She is now a long-term care resident of [facility].  She has a PMH of substance abuse, homelessness, depression, rheumatoid arthritis, hypertension, anemia, migraines, and levamisole-induced vasculitis. She was seen in her room today. She said that she does not usually use Oxycodone unless she is in severe pain. Review of MAR confirmed that she has not used Oxycodone in a week. She has agreed to decrease her Oxycodone 5 mg to every 6 hours PRN. Her BPs are well-controlled -114/72, 114/73, 118/72, 124/72.  She has been admitted to [facility] rehabilitation on 08/26/2018 from a recent hospitalization for cellulitis of bilateral AKA stump, was having pain and drainage.  She was treated with IV antibiotics and orthopedics consulted.  Dr. Sharol Given recommended to continue wound  dressing changes and no need for further revision surgery.  She was supposed to be discharged to a shelter but patient changed her mind and was discharge to SNF.   Past Medical History:  Diagnosis Date  . CAP (community acquired pneumonia) 03/2014 X 2  . Cocaine abuse ([place])    ongoing with resultant vaculitis.  . Depression   . Headache    "weekly" (07/29/2016)  . Hypertension   . Inflammatory arthritis   . Migraines    "probably 5-6/yr" (07/29/2016)  . Normocytic anemia    BL Hgb 9.8-12. Last anemia panel 04/2010 - showing Fe 19, ferritin 101.  Pt on monthly B12 injections  . Rheumatoid arthritis(714.0)    patient reported  . 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 Followed by Dr. Louanne Skye   Past Surgical History:  Procedure Laterality Date  . AMPUTATION Left 05/22/2016   Procedure: AMPUTATION LEFT LONG FINGER;  Surgeon: Marybelle Killings, MD;  Location: [place];  Service: Orthopedics;  Laterality: Left;  . AMPUTATION Bilateral 04/10/2017   Procedure: AMPUTATION BELOW KNEE;  Surgeon: Newt Minion, MD;  Location: [place];  Service: Orthopedics;  Laterality: Bilateral;  . AMPUTATION Bilateral 02/06/2018   Procedure: AMPUTATION ABOVE KNEE;  Surgeon: Newt Minion, MD;  Location: [place];  Service: Orthopedics;  Laterality: Bilateral;  . HERNIA REPAIR     "stomach"  . I&D EXTREMITY Right 09/26/2015   Procedure: IRRIGATION AND DEBRIDEMENT LEG WOUND  VAC PLACEMENT.;  Surgeon: Loel Lofty Dillingham, DO;  Location: [place];  Service:  Plastics;  Laterality: Right;  . INCISION AND DRAINAGE OF WOUND Bilateral 10/20/2016   Procedure: IRRIGATION AND DEBRIDEMENT WOUND BILATERAL;  Surgeon: Edrick Kins, DPM;  Location: [place];  Service: Podiatry;  Laterality: Bilateral;  . IRRIGATION AND DEBRIDEMENT ABSCESS Bilateral 09/26/2013   Procedure: DEBRIDEMENT ULCERS BILATERAL THIGHS;  Surgeon: Gwenyth Ober, MD;  Location: [place];  Service: General;  Laterality:  Bilateral;  . SKIN BIOPSY Bilateral    shin nodules    Allergies  Allergen Reactions  . Levamisole     Levamisole is a chemical adulterant of cocaine which can cause cutaneous necrosis It is used as an Health and safety inspector in veterinary medicine  . Acetaminophen Swelling and Other (See Comments)    Reaction:  Eyelid swelling  . Haldol [Haloperidol Lactate]     Possible eye swelling was given together with Toradol unsure if caused the reaction  . Lisinopril     UNSPECIFIED REACTION   . Toradol [Ketorolac Tromethamine]     Eye swelling    Outpatient Encounter Medications as of 09/13/2018  Medication Sig  . amLODipine (NORVASC) 10 MG tablet Take 1 tablet (10 mg total) by mouth daily.  . feeding supplement, ENSURE ENLIVE, (ENSURE ENLIVE) LIQD Take 237 mLs by mouth 2 (two) times daily between meals.  . furosemide (LASIX) 40 MG tablet Take 1 tablet (40 mg total) by mouth daily.  Marland Kitchen gabapentin (NEURONTIN) 100 MG capsule Take 1 capsule (100 mg total) by mouth 3 (three) times daily.  Marland Kitchen losartan (COZAAR) 25 MG tablet Take 1 tablet (25 mg total) by mouth daily.  . ondansetron (ZOFRAN ODT) 4 MG disintegrating tablet Take 1 tablet (4 mg total) by mouth every 8 (eight) hours as needed for nausea or vomiting.  Marland Kitchen oxyCODONE (OXY IR/ROXICODONE) 5 MG immediate release tablet Take 1 tablet (5 mg total) by mouth every 4 (four) hours as needed for severe pain.  . pantoprazole (PROTONIX) 40 MG tablet Take 1 tablet (40 mg total) by mouth daily.  . polyethylene glycol (MIRALAX / GLYCOLAX) packet Take 17 g by mouth daily.  . traMADol (ULTRAM) 50 MG tablet Take 2 tablets (100 mg total) by mouth 4 (four) times daily.   No facility-administered encounter medications on file as of 09/13/2018.     Review of Systems  GENERAL: No change in appetite, no fatigue, no weight changes, no fever, chills or weakness MOUTH and THROAT: Denies oral discomfort, gingival pain or bleeding RESPIRATORY: no cough, SOB, DOE,  wheezing, hemoptysis CARDIAC: No chest pain, edema or palpitations GI: No abdominal pain, diarrhea, constipation, heart burn, nausea or vomiting GU: Denies dysuria, frequency, hematuria, incontinence, or discharge PSYCHIATRIC: Denies feelings of depression or anxiety. No report of hallucinations, insomnia, paranoia, or agitation   Immunization History  Administered Date(s) Administered  . Influenza Whole 09/15/2010  . Influenza,inj,Quad PF,6+ Mos 10/11/2014, 10/24/2017  . Influenza-Unspecified 07/06/2011  . Pneumococcal Polysaccharide-23 09/15/2010, 10/11/2014  . Td 09/15/2010  . Tdap 10/11/2013   Pertinent  Health Maintenance Due  Topic Date Due  . COLONOSCOPY  04/17/2013  . PAP SMEAR  09/18/2013  . MAMMOGRAM  04/19/2015  . INFLUENZA VACCINE  05/05/2018   Fall Risk  05/30/2018 07/26/2017 04/27/2017 02/01/2017 01/20/2017  Falls in the past year? Yes Yes No No No  Comment - - - - -  Number falls in past yr: 1 1 - - -  Injury with Fall? No Yes - - -  Risk Factor Category  - - - - -  Risk for  fall due to : - - - - -  Risk for fall due to: Comment - - - - -  Follow up - - - - -    Vitals:   09/13/18 1149  BP: 114/72  Pulse: 82  Temp: 97.9 F (36.6 C)  TempSrc: Oral  Weight: 74 lb 9.6 oz (33.8 kg)   Body mass index is 14.1 kg/m.  Physical Exam  GENERAL APPEARANCE:  In no acute distress.  SKIN:  Has surgical incision on left lateral thigh front 2.5 X 12 cm with 60% intact and scattered areas of eschar 1.5 X 2.5 cm laterally, 1 X 2 cm medially; surgical incision on right inner thigh front 3.5 X 12 cm with 50% intact skin, has areas of intact black eschar without drainage, odor or erythema; right hip pressure ulcer stage 3 1 X 1.5 X 0.2 cm and left hip stage 3 pressure ulcer 1 X 1X 0.2 cm MOUTH and THROAT: Lips are without lesions. Oral mucosa is moist and without lesions. RESPIRATORY: Breathing is even & unlabored, BS CTAB CARDIAC: RRR, no murmur,no extra heart sounds, no  edema GI: Abdomen soft, normal BS, no masses, no tenderness EXTREMITIES:  S/P bilateral AKA NEUROLOGICAL: There is no tremor. Speech is clear. Alert and oriented X 3.  PSYCHIATRIC: Affect and behavior are appropriate   Labs reviewed: Recent Labs    08/02/18 0240  08/16/18 0335 08/17/18 0332 08/18/18 0416  08/21/18 0345 08/22/18 0339 08/23/18 0321  NA 139   < > 134* 134* 136   < > 137 135 135  K 3.3*   < > 4.4 4.3 3.8   < > 4.3 3.5 4.3  CL 109   < > 110 110 109   < > 105 101 98  CO2 23   < > 16* 17* 17*   < > 23 26 28   GLUCOSE 122*   < > 80 92 95   < > 92 85 96  BUN 18   < > 27* 21* 15   < > 13 12 14   CREATININE 0.96   < > 0.88 0.69 0.72   < > 0.89 0.70 0.70  CALCIUM 8.9   < > 8.1* 8.1* 8.0*   < > 8.7* 8.6* 9.1  MG 1.7  --  2.0 1.8  --   --   --   --   --   PHOS  --   --  1.8* 1.5* 2.8  --   --   --   --    < > = values in this interval not displayed.   Recent Labs    08/17/18 0332 08/18/18 0416 08/20/18 0639  AST 287* 137* 51*  ALT 457* 320* 173*  ALKPHOS 98 95 90  BILITOT 4.0* 3.7* 4.5*  PROT 6.0* 5.8* 5.4*  ALBUMIN 2.5* 2.3* 2.1*   Recent Labs    08/02/18 0240 08/11/18 0101 08/13/18 0245  08/21/18 0345 08/22/18 0339 08/23/18 0321  WBC 12.2* 10.7* 27.0*   < > 12.8* 11.6* 11.0*  NEUTROABS 9.9* 7.6 23.7*  --   --   --   --   HGB 10.5* 10.2* 9.8*   < > 10.0* 10.1* 10.1*  HCT 34.6* 32.3* 33.2*   < > 33.5* 34.0* 33.9*  MCV 84.4 81.8 86.7   < > 83.3 83.1 82.5  PLT 264 335 293   < > 216 232 275   < > = values in this interval not displayed.   Lab  Results  Component Value Date   TSH 1.650 07/31/2018   Lab Results  Component Value Date   HGBA1C 5.0 05/30/2018   Lab Results  Component Value Date   CHOL 164 05/20/2018   HDL 40 (L) 05/20/2018   LDLCALC 116 (H) 05/20/2018   TRIG 40 05/20/2018   CHOLHDL 4.1 05/20/2018    Significant Diagnostic Results in last 30 days:  Ct Head Wo Contrast  Result Date: 08/16/2018 CLINICAL DATA:  Altered level of  consciousness. History of migraines, hypertension and cocaine abuse. EXAM: CT HEAD WITHOUT CONTRAST TECHNIQUE: Contiguous axial images were obtained from the base of the skull through the vertex without intravenous contrast. COMPARISON:  CT HEAD Feb 16, 2018 FINDINGS: BRAIN: No intraparenchymal hemorrhage, mass effect nor midline shift. Borderline parenchymal brain volume loss for age. No acute large vascular territory infarcts. No abnormal extra-axial fluid collections. Basal cisterns are patent. VASCULAR: Moderate calcific atherosclerosis carotid siphon. SKULL/SOFT TISSUES: No skull fracture. No significant soft tissue swelling. ORBITS/SINUSES: The included ocular globes and orbital contents are normal.Trace paranasal sinus mucosal thickening. Mastoid air cells are well aerated. OTHER: Patient is edentulous. IMPRESSION: 1. No acute intracranial process. 2. Borderline parenchymal brain volume loss for age. 3. Advanced moderate intracranial atherosclerosis. Electronically Signed   By: Elon Alas M.D.   On: 08/16/2018 22:28   US Abdomen Complete  Result Date: 08/17/2018 CLINICAL DATA:  Transaminitis EXAM: ABDOMEN ULTRASOUND COMPLETE COMPARISON:  Abdominal ultrasound of July 30 2018 FINDINGS: Gallbladder: The gallbladder is adequately distended. Echogenic bile or sludge is present. There no discrete stones. There is no gallbladder wall thickening or positive sonographic [name]'s sign. A small amount of ascites is noted adjacent to the gallbladder. Common bile duct: Diameter: 4 mm Liver: The hepatic echotexture is heterogeneously increased. There is no discrete mass or ductal dilation. A small amount of ascites surrounds the liver. Portal vein is patent on color Doppler imaging with bidirectional flow. IVC: No abnormality visualized. Pancreas: The pancreatic parenchyma is grossly normal. The duct is dilated to 4 mm. No discrete pancreatic masses are observed. Spleen: The spleen is normal in size and  echotexture. Right Kidney: Length: 11.2 cm. Echogenicity within normal limits. No mass or hydronephrosis visualized. Left Kidney: Length: 9.9 cm. Echogenicity within normal limits. No mass or hydronephrosis visualized. Abdominal aorta: There is no aortic aneurysm. There is mural plaque. Other findings: There is ascites as well as bilateral pleural effusions. IMPRESSION: Increased hepatic echotexture without discrete mass. By directional flow in the portal vein consistent with portal hypertension. Mild dilation of the pancreatic duct 4 mm without evidence of pancreatic masses. Gallbladder sludge without discrete stones or sonographic evidence of acute cholecystitis. Electronically Signed   By: David  Martinique M.D.   On: 08/17/2018 09:06   Mr Femur Right W Wo Contrast  Result Date: 08/18/2018 CLINICAL DATA:  57 y.o. female with medical history significant of Levamisole vasculitis, RA, anemia, migraine, HTN, depression, cocaine use who presents for bilateral LE pain. Bilateral AKA. Chronic pain for wounds of the lower extremity at the stumps. EXAM: MR OF THE LEFT LOWER EXTREMITY WITHOUT AND WITH CONTRAST MRI OF THE RIGHT FEMUR WITHOUT AND WITH CONTRAST TECHNIQUE: Multiplanar, multisequence MR imaging of the right lower extremity was performed both before and after administration of intravenous contrast. Multiplanar, multisequence MR imaging of the left lower extremity was performed both before and after administration of intravenous contrast. CONTRAST:  2 mL Gadavist COMPARISON:  None. FINDINGS: Bones/Joint/Cartilage Bilateral above the knee amputation through the distal femoral  diaphysis. No periosteal reaction or bone destruction. No aggressive osseous lesion. No T1 signal abnormality in bilateral femurs. No acute fracture or dislocation. Mild osteoarthritis of bilateral hips. Small bilateral hip joint effusions. Normal alignment. Ligaments, Muscles and Tendons Mild diffuse T2 hyperintense signal throughout the  pelvic and femoral musculature without significant enhancement likely neurogenic. No intramuscular drainable fluid collection or hematoma. Soft tissue Diffuse soft tissue edema throughout the subcutaneous fat of the pelvis and bilateral femurs which may be secondary to anasarca or fluid overload versus severe cellulitis. T2 hyperintense well-circumscribed fluid collection adjacent to the pectineus muscle bilaterally without enhancement unchanged compared with prior CT abdomen dated 06/21/2018. No other drainable fluid collection. Moderate pelvic free fluid. IMPRESSION: 1. Bilateral above the knee amputation through the distal femoral diaphysis without evidence of osteomyelitis. 2. Diffuse soft tissue edema throughout the subcutaneous fat of the pelvis and bilateral femurs which may be secondary to anasarca. No drainable fluid collection to suggest an abscess. 3. Mild osteoarthritis of bilateral hips with bilateral hip joint effusions. Electronically Signed   By: Kathreen Devoid   On: 08/18/2018 08:34   Mr Femur Left W Wo Contrast  Result Date: 08/18/2018 CLINICAL DATA:  57 y.o. female with medical history significant of Levamisole vasculitis, RA, anemia, migraine, HTN, depression, cocaine use who presents for bilateral LE pain. Bilateral AKA. Chronic pain for wounds of the lower extremity at the stumps. EXAM: MR OF THE LEFT LOWER EXTREMITY WITHOUT AND WITH CONTRAST MRI OF THE RIGHT FEMUR WITHOUT AND WITH CONTRAST TECHNIQUE: Multiplanar, multisequence MR imaging of the right lower extremity was performed both before and after administration of intravenous contrast. Multiplanar, multisequence MR imaging of the left lower extremity was performed both before and after administration of intravenous contrast. CONTRAST:  2 mL Gadavist COMPARISON:  None. FINDINGS: Bones/Joint/Cartilage Bilateral above the knee amputation through the distal femoral diaphysis. No periosteal reaction or bone destruction. No aggressive  osseous lesion. No T1 signal abnormality in bilateral femurs. No acute fracture or dislocation. Mild osteoarthritis of bilateral hips. Small bilateral hip joint effusions. Normal alignment. Ligaments, Muscles and Tendons Mild diffuse T2 hyperintense signal throughout the pelvic and femoral musculature without significant enhancement likely neurogenic. No intramuscular drainable fluid collection or hematoma. Soft tissue Diffuse soft tissue edema throughout the subcutaneous fat of the pelvis and bilateral femurs which may be secondary to anasarca or fluid overload versus severe cellulitis. T2 hyperintense well-circumscribed fluid collection adjacent to the pectineus muscle bilaterally without enhancement unchanged compared with prior CT abdomen dated 06/21/2018. No other drainable fluid collection. Moderate pelvic free fluid. IMPRESSION: 1. Bilateral above the knee amputation through the distal femoral diaphysis without evidence of osteomyelitis. 2. Diffuse soft tissue edema throughout the subcutaneous fat of the pelvis and bilateral femurs which may be secondary to anasarca. No drainable fluid collection to suggest an abscess. 3. Mild osteoarthritis of bilateral hips with bilateral hip joint effusions. Electronically Signed   By: Kathreen Devoid   On: 08/18/2018 08:34   Dg Chest Port 1 View  Result Date: 08/20/2018 CLINICAL DATA:  Hypoxia EXAM: PORTABLE CHEST 1 VIEW COMPARISON:  08/15/2018 FINDINGS: 1501 hours. Interval development of diffuse micro nodular opacity in both lungs with an upper lobe predominance. Cardiopericardial silhouette is enlarged. No substantial pleural effusion. Nodular opacity overlying the right base is similar to a nodular opacity along the left chest wall, likely representing nipple shadows. The visualized bony structures of the thorax are intact. Telemetry leads overlie the chest. IMPRESSION: Cardiomegaly with diffuse interstitial and micro  nodular opacity with an upper lung predominance.  Imaging features are compatible with diffuse infection or pulmonary edema. Electronically Signed   By: Misty Stanley M.D.   On: 08/20/2018 15:19   Dg Chest Port 1 View  Result Date: 08/15/2018 CLINICAL DATA:  Follow-up pneumonia EXAM: PORTABLE CHEST 1 VIEW COMPARISON:  08/13/2018 FINDINGS: Cardiac shadow remains enlarged. Aortic calcifications are again seen. The lungs are well aerated bilaterally. Some patchy atelectatic changes are noted in the right mid lung and left lung base slightly increased when compared with the prior exam. Previously seen nipple shadows are visualized more inferiorly. No sizable effusion is seen. No bony abnormality is noted. IMPRESSION: Patchy bilateral atelectasis. Electronically Signed   By: Inez Catalina M.D.   On: 08/15/2018 07:39    Assessment/Plan  1. Other chronic pain - has not used Oxycodone PRN for the past week, will decrease Oxycodone 5 mg 1 tab from Q 4 hours PRN to Q 6 hours PRN, continue Tramadol 50 mg 2 tabs = 100 mg QID, Neurontin 100 mg 1 capsule TID   2. Chronic diastolic heart failure (HCC) - no SOB, continue Lasix 40 mg daily, will check BMP   3. Essential hypertension - well-controlled, will continue Losartan 25 mg 1 tab daily and Amlodipine 5 mg daily    Family/ staff Communication: Discussed plan of care with resident.  Labs/tests ordered:  BMP  Goals of care:   Long-term care   Durenda Age, NP [facility] [phone] (Monday-Friday 8:00 a.m. - 5:00 p.m.) [phone] (after hours)

## 2018-09-13 NOTE — Progress Notes (Signed)
This encounter was created in error - please disregard.

## 2018-09-14 LAB — BASIC METABOLIC PANEL
BUN: 28 — AB (ref 4–21)
CREATININE: 0.6 (ref 0.5–1.1)
Glucose: 76
Potassium: 5.4 — AB (ref 3.4–5.3)
Sodium: 138 (ref 137–147)

## 2018-09-15 LAB — BASIC METABOLIC PANEL
BUN: 29 — AB (ref 4–21)
Creatinine: 0.6 (ref 0.5–1.1)
Glucose: 105
POTASSIUM: 4.3 (ref 3.4–5.3)
Sodium: 140 (ref 137–147)

## 2018-09-22 ENCOUNTER — Encounter: Payer: Self-pay | Admitting: Adult Health

## 2018-09-22 ENCOUNTER — Non-Acute Institutional Stay (SKILLED_NURSING_FACILITY): Payer: Medicaid Other | Admitting: Adult Health

## 2018-09-22 DIAGNOSIS — R5081 Fever presenting with conditions classified elsewhere: Secondary | ICD-10-CM | POA: Diagnosis not present

## 2018-09-22 DIAGNOSIS — Z720 Tobacco use: Secondary | ICD-10-CM | POA: Diagnosis not present

## 2018-09-22 DIAGNOSIS — I952 Hypotension due to drugs: Secondary | ICD-10-CM | POA: Diagnosis not present

## 2018-09-22 DIAGNOSIS — G8929 Other chronic pain: Secondary | ICD-10-CM | POA: Diagnosis not present

## 2018-09-22 IMAGING — DX DG WRIST COMPLETE 3+V*L*
4 series · 4 of 4 positions shown · non-contrast
Comparison: 06/14/2016

CLINICAL DATA: Pain and swelling to left posterior wrist for 2-3
days

EXAM:
LEFT WRIST - COMPLETE 3+ VIEW

[wrist pa]
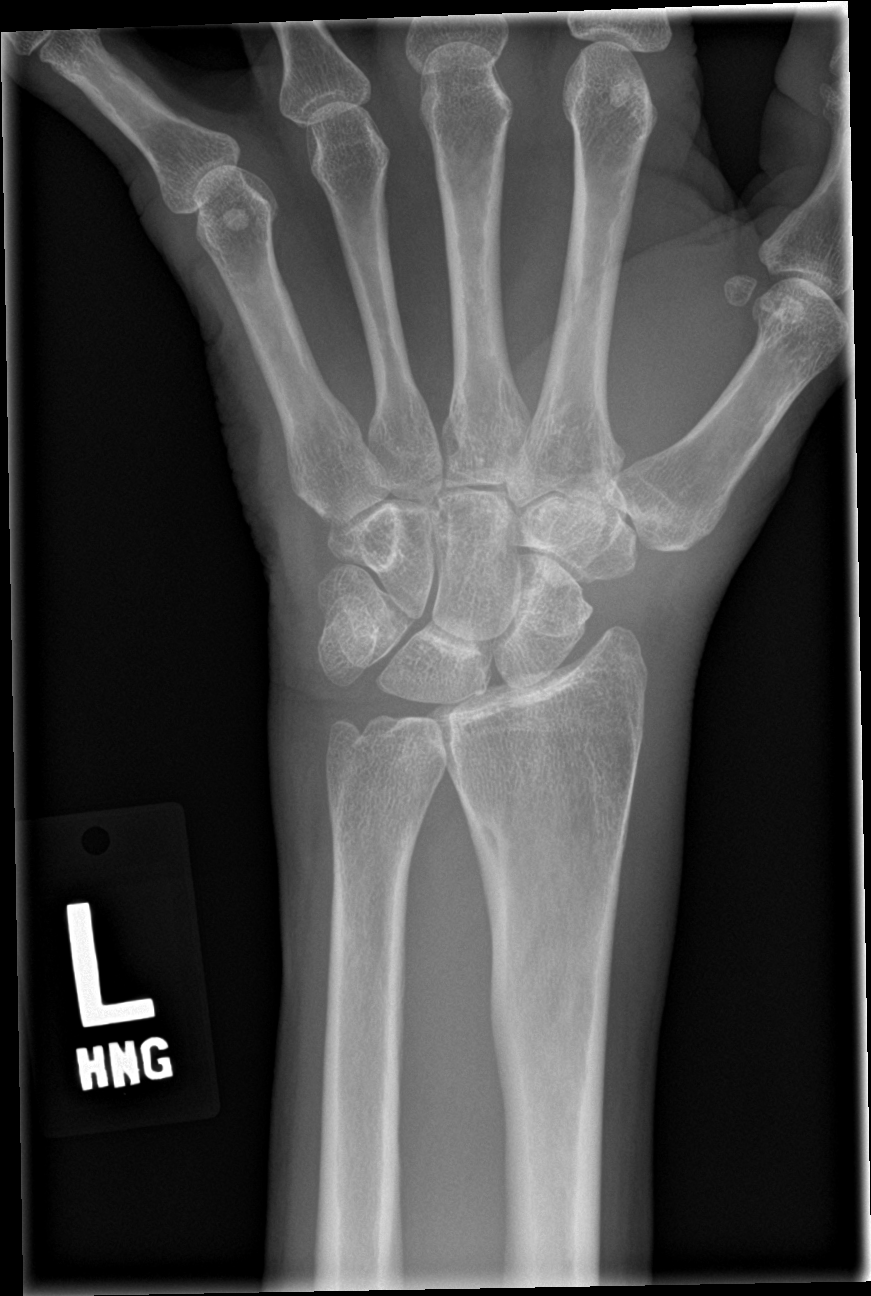

[wrist obl]
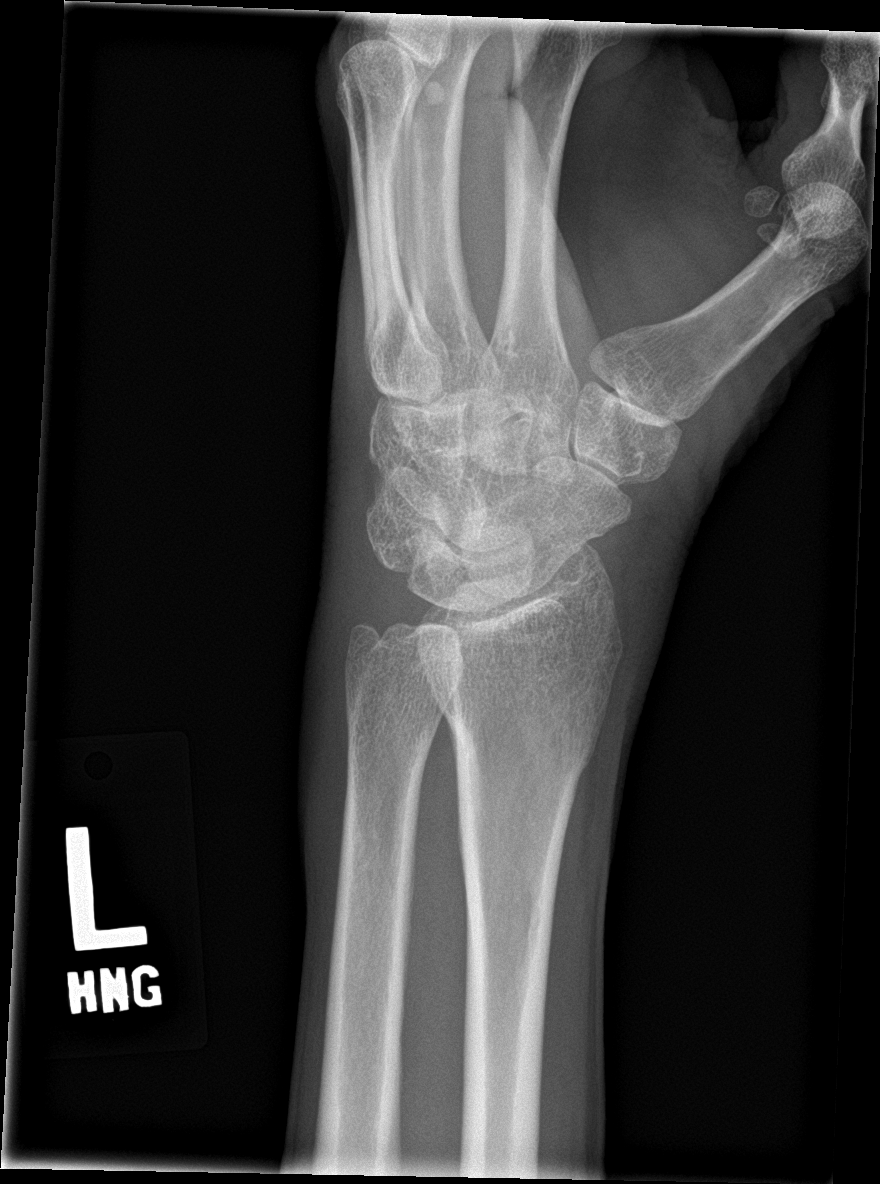

[wrist lat]
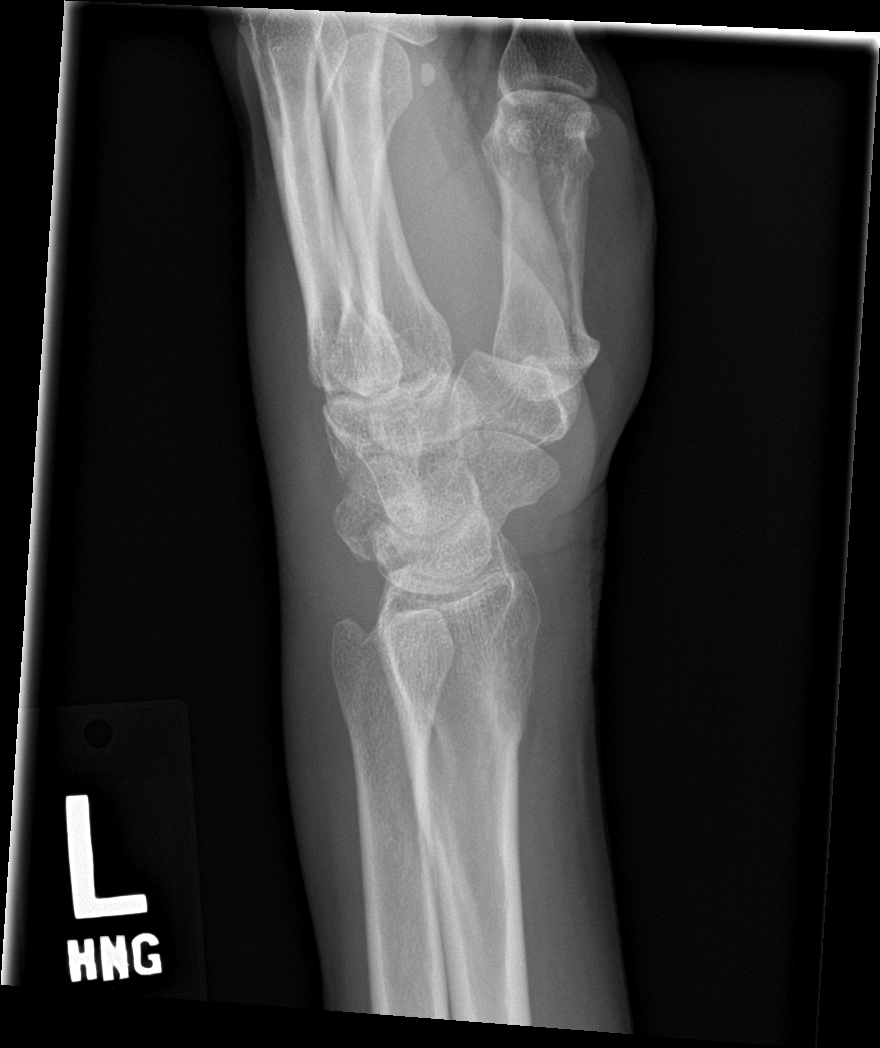

[wrist navicular]
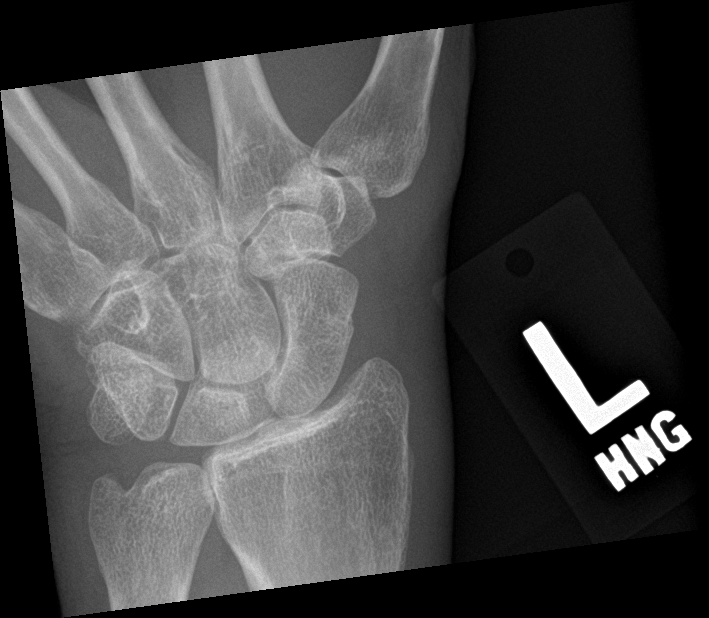

[4 of 4 positions shown; findings below may reference images not displayed]

FINDINGS: There is no evidence of fracture or dislocation. There is no
evidence of arthropathy or other focal bone abnormality. Soft
tissues are unremarkable.
IMPRESSION: Negative.

## 2018-09-22 IMAGING — DX DG CHEST 2V
2 series · 2 of 2 positions shown · non-contrast
Comparison: August 01, 2016

CLINICAL DATA: Fever and hemoptysis

EXAM:
CHEST  2 VIEW

[w chest lat]
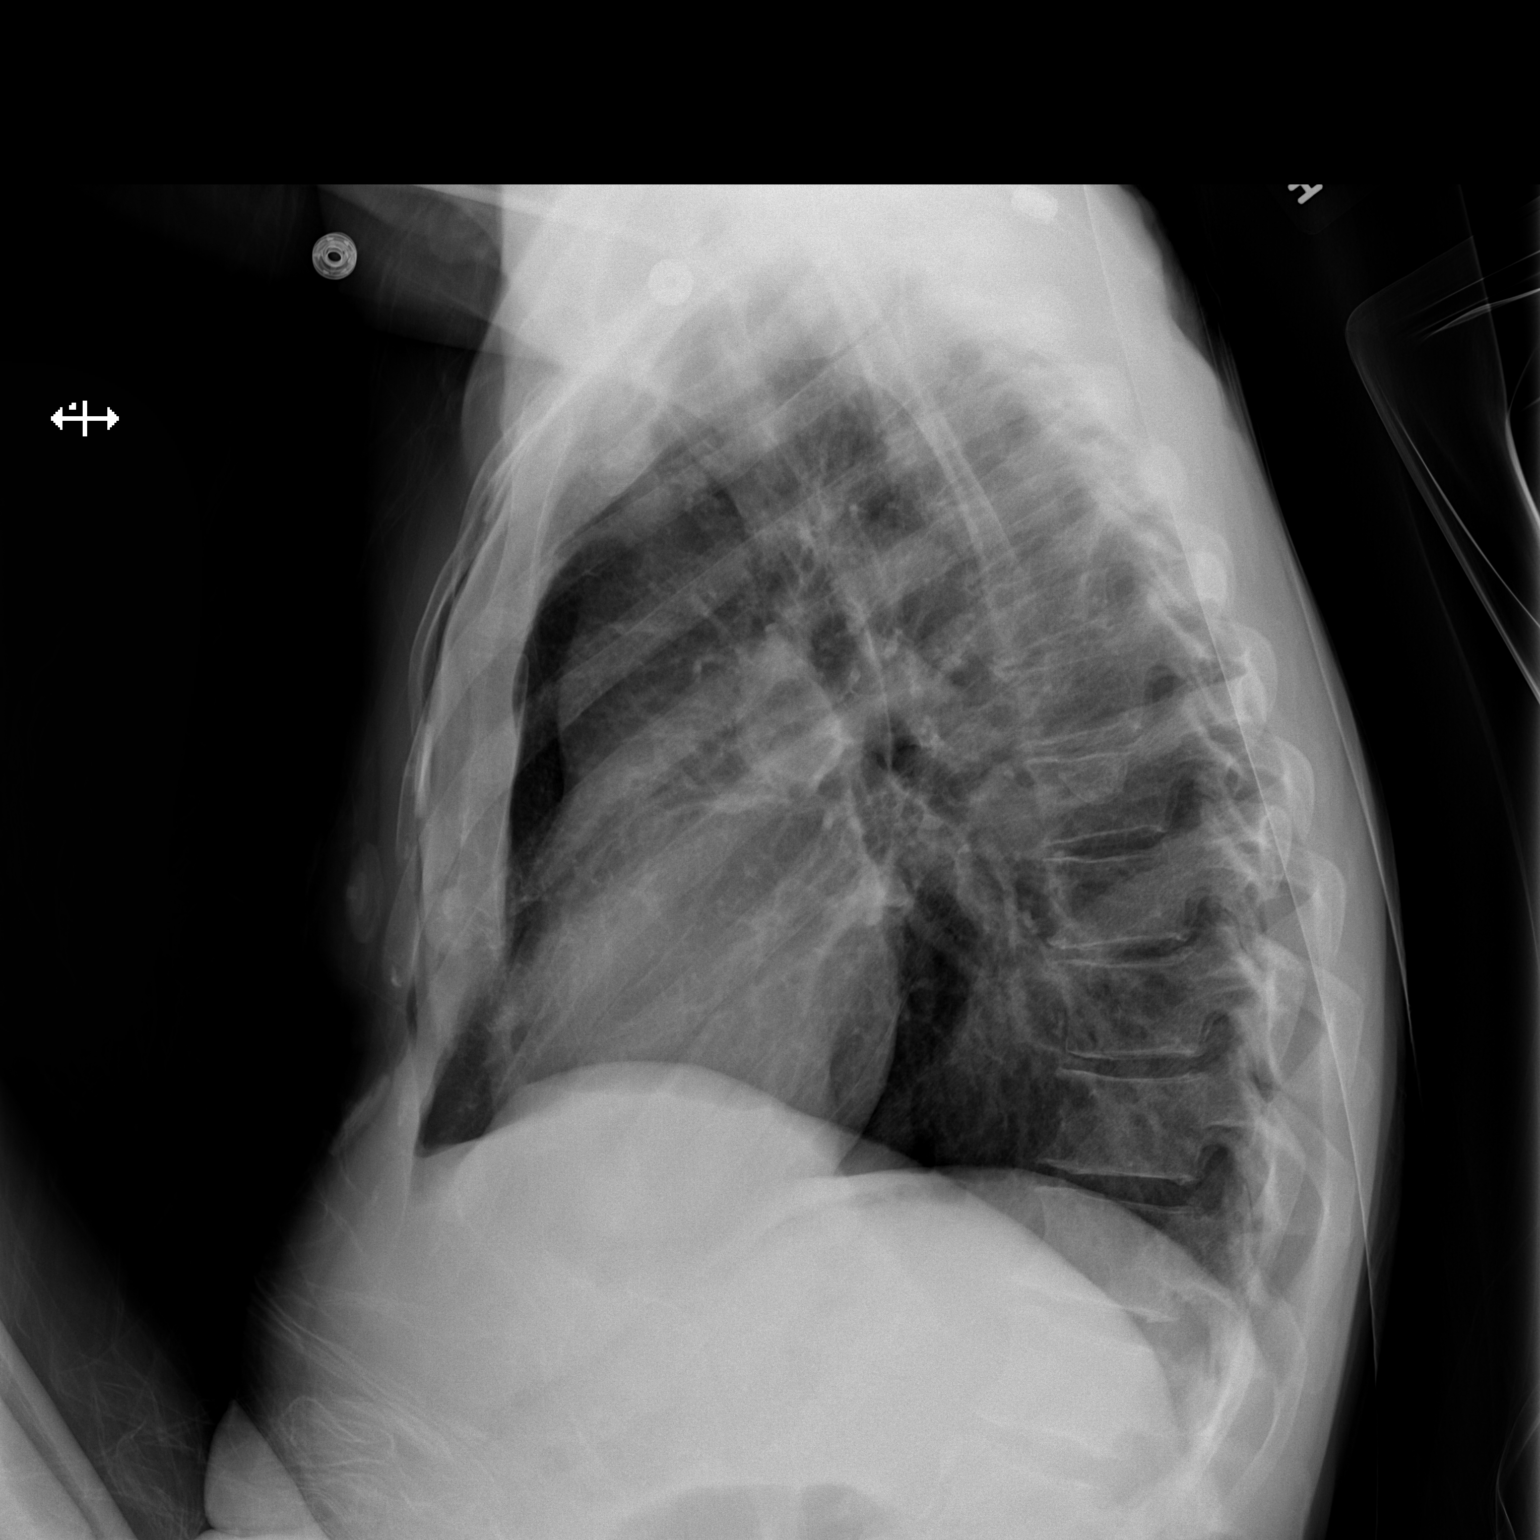

[x chest ap]
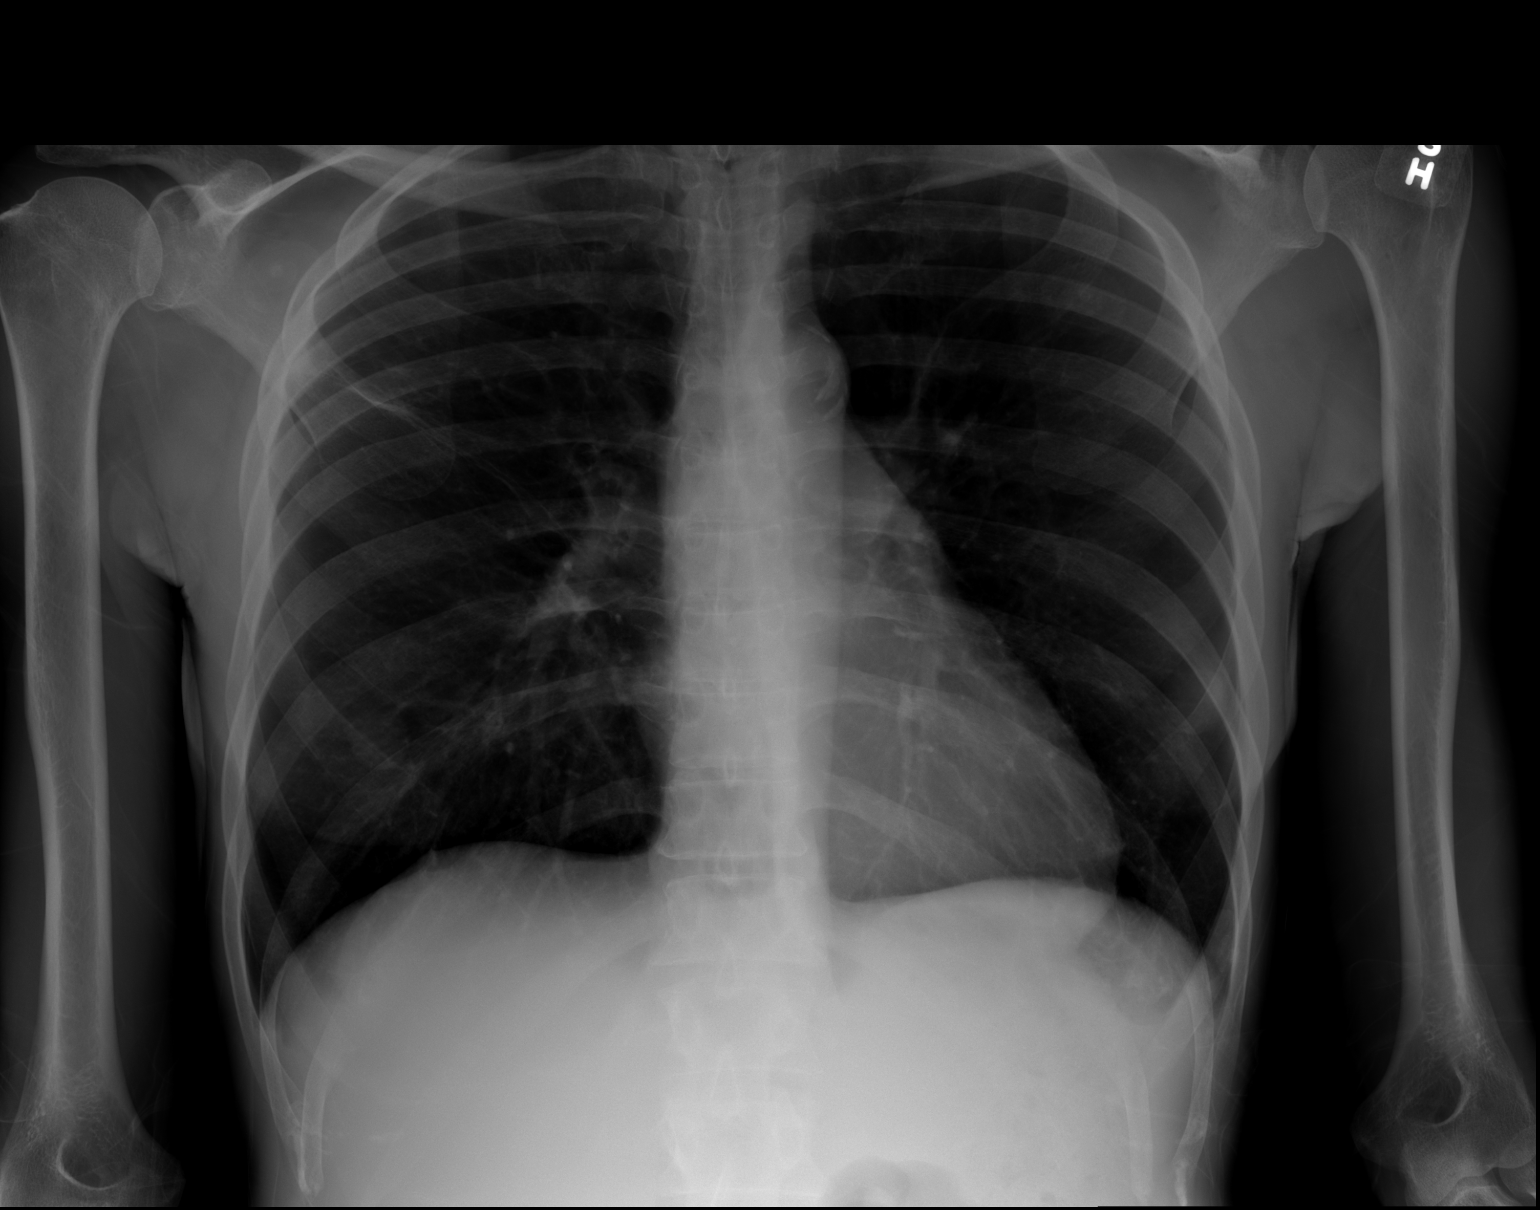

[2 of 2 positions shown; findings below may reference images not displayed]

FINDINGS: There is slight scarring in each upper lobe. There is no edema or
consolidation. Heart size and pulmonary vascularity are normal. No
adenopathy. There is atherosclerotic calcification in the aorta. No
bone lesions.
IMPRESSION: Mild scarring in both upper lobes. No edema or consolidation. Stable
cardiac silhouette. There is aortic atherosclerosis.

## 2018-09-22 NOTE — Progress Notes (Signed)
Location:  [facility] Room Number: 225-B Place of Service:  SNF (31) Provider:  Durenda Age, NP  Patient Care Team: Azzie Glatter, FNP as PCP - General (Family Medicine)  Extended Emergency Contact Information Primary Emergency Contact: McNair,[name]          [place] Mobile Phone: [phone] Relation: Brother  Code Status:  Full Code  Goals of care: Advanced Directive information Advanced Directives 08/20/2018  Does Patient Have a Medical Advance Directive? No  Type of Advance Directive -  Does patient want to make changes to medical advance directive? -  Would patient like information on creating a medical advance directive? No - Patient declined  Pre-existing out of facility DNR order (yellow form or pink MOST form) -  Some encounter information is confidential and restricted. Go to Review Flowsheets activity to see all data.     Chief Complaint  Patient presents with  . Medical Management of Chronic Issues    Routine [facility] visit    HPI:  Pt is a 57 y.o. female seen today for medical management of chronic diseases.  She is a long-term care resident of [facility].  She has a PMH of substance abuse, depression, rheumatoid arthritis, hypertension, anemia, migraines, and levamisole-induced vasculitis. She was seen in the room today. She reported being dizzy and nurse reported that her BPs has been low - 104/62, 108/61, 89/55, 110/72. She was noted to have taken Tramadol, Oxycodone, Amlodipine, Losartan and Lasix all at the same time. She reported that she got dizzy while smoking outside the facility. Counseled regarding smoking but she insisted that she does not smoke as much. She was reported to have fever, T101.5, in the afternoon. No reported cough, body malaise, nor congestion.   Past Medical History:  Diagnosis Date  . CAP (community acquired pneumonia) 03/2014 X 2  . 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  . Depression   . Headache    "weekly" (07/29/2016)  . Hypertension   . Inflammatory arthritis   . Migraines    "probably 5-6/yr" (07/29/2016)  . Normocytic anemia    BL Hgb 9.8-12. Last anemia panel 04/2010 - showing Fe 19, ferritin 101.  Pt on monthly B12 injections  . Rheumatoid arthritis(714.0)    patient reported  . 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 Followed by Dr. Louanne Skye   Past Surgical History:  Procedure Laterality Date  . AMPUTATION Left 05/22/2016   Procedure: AMPUTATION LEFT LONG FINGER;  Surgeon: Marybelle Killings, MD;  Location: [facility];  Service: Orthopedics;  Laterality: Left;  . AMPUTATION Bilateral 04/10/2017   Procedure: AMPUTATION BELOW KNEE;  Surgeon: Newt Minion, MD;  Location: [place];  Service: Orthopedics;  Laterality: Bilateral;  . AMPUTATION Bilateral 02/06/2018   Procedure: AMPUTATION ABOVE KNEE;  Surgeon: Newt Minion, MD;  Location: [place];  Service: Orthopedics;  Laterality: Bilateral;  . HERNIA REPAIR     "stomach"  . I&D EXTREMITY Right 09/26/2015   Procedure: IRRIGATION AND DEBRIDEMENT LEG WOUND  VAC PLACEMENT.;  Surgeon: Loel Lofty Dillingham, DO;  Location: [place];  Service: Plastics;  Laterality: Right;  . INCISION AND DRAINAGE OF WOUND Bilateral 10/20/2016   Procedure: IRRIGATION AND DEBRIDEMENT WOUND BILATERAL;  Surgeon: Edrick Kins, DPM;  Location: [facility];  Service: Podiatry;  Laterality: Bilateral;  . IRRIGATION AND DEBRIDEMENT ABSCESS Bilateral 09/26/2013  Procedure: DEBRIDEMENT ULCERS BILATERAL THIGHS;  Surgeon: Gwenyth Ober, MD;  Location: [facility];  Service: General;  Laterality: Bilateral;  . SKIN BIOPSY Bilateral    shin nodules    Allergies  Allergen Reactions  . Levamisole     Levamisole is a chemical adulterant of cocaine which can cause cutaneous necrosis It is used as an Health and safety inspector in veterinary medicine  . Acetaminophen  Swelling and Other (See Comments)    Reaction:  Eyelid swelling  . Haldol [Haloperidol Lactate]     Possible eye swelling was given together with Toradol unsure if caused the reaction  . Lisinopril     UNSPECIFIED REACTION   . Toradol [Ketorolac Tromethamine]     Eye swelling    Outpatient Encounter Medications as of 09/22/2018  Medication Sig  . amLODipine (NORVASC) 5 MG tablet Take 5 mg by mouth daily.  . feeding supplement, ENSURE ENLIVE, (ENSURE ENLIVE) LIQD Take 237 mLs by mouth 2 (two) times daily between meals.  . furosemide (LASIX) 40 MG tablet Take 1 tablet (40 mg total) by mouth daily.  Marland Kitchen gabapentin (NEURONTIN) 100 MG capsule Take 100 mg by mouth 4 (four) times daily.  Marland Kitchen losartan (COZAAR) 25 MG tablet Take 1 tablet (25 mg total) by mouth daily.  . ondansetron (ZOFRAN ODT) 4 MG disintegrating tablet Take 1 tablet (4 mg total) by mouth every 8 (eight) hours as needed for nausea or vomiting.  Marland Kitchen oxycodone (OXY-IR) 5 MG capsule Take 5 mg by mouth every 6 (six) hours as needed.  . pantoprazole (PROTONIX) 40 MG tablet Take 1 tablet (40 mg total) by mouth daily.  . polyethylene glycol (MIRALAX / GLYCOLAX) packet Take 17 g by mouth daily.  . traMADol (ULTRAM) 50 MG tablet Take 2 tablets (100 mg total) by mouth 4 (four) times daily.  . [DISCONTINUED] amLODipine (NORVASC) 10 MG tablet Take 1 tablet (10 mg total) by mouth daily.  . [DISCONTINUED] gabapentin (NEURONTIN) 100 MG capsule Take 1 capsule (100 mg total) by mouth 3 (three) times daily.  . [DISCONTINUED] oxyCODONE (OXY IR/ROXICODONE) 5 MG immediate release tablet Take 1 tablet (5 mg total) by mouth every 4 (four) hours as needed for severe pain.   No facility-administered encounter medications on file as of 09/22/2018.     Review of Systems  GENERAL: No change in appetite, no fatigue, no weight changes, +fever MOUTH and THROAT: Denies oral discomfort, gingival pain or bleeding RESPIRATORY: no cough, SOB, DOE, wheezing,  hemoptysis CARDIAC: No chest pain, edema or palpitations GI: No abdominal pain, diarrhea, constipation, heart burn, nausea or vomiting GU: Denies dysuria, frequency, hematuria, incontinence, or discharge PSYCHIATRIC: Denies feelings of depression or anxiety. No report of hallucinations, insomnia, paranoia, or agitation    Immunization History  Administered Date(s) Administered  . Influenza Whole 09/15/2010  . Influenza,inj,Quad PF,6+ Mos 10/11/2014, 10/24/2017  . Influenza-Unspecified 07/06/2011  . Pneumococcal Polysaccharide-23 09/15/2010, 10/11/2014  . Td 09/15/2010  . Tdap 10/11/2013   Pertinent  Health Maintenance Due  Topic Date Due  . COLONOSCOPY  04/17/2013  . INFLUENZA VACCINE  05/05/2018  . PAP SMEAR-Modifier  09/22/2018 (Originally 09/18/2013)  . MAMMOGRAM  09/23/2019 (Originally 04/19/2015)   Fall Risk  05/30/2018 07/26/2017 04/27/2017 02/01/2017 01/20/2017  Falls in the past year? Yes Yes No No No  Comment - - - - -  Number falls in past yr: 1 1 - - -  Injury with Fall? No Yes - - -  Risk Factor Category  - - - - -  Risk for fall due to : - - - - -  Risk for fall due to: Comment - - - - -  Follow up - - - - -     Vitals:   09/22/18 1057  BP: 104/62  Pulse: 71  Resp: 18  Temp: 98.5 F (36.9 C)  TempSrc: Oral  Weight: 77 lb (34.9 kg)  Height: 5' 1"  (1.549 m)   Body mass index is 14.55 kg/m.  Physical Exam  GENERAL APPEARANCE:  In no acute distress.  SKIN:  Left lateral thigh front closed with scarring.  MOUTH and THROAT: Lips are without lesions. Oral mucosa is moist and without lesions.  RESPIRATORY: Breathing is even & unlabored, BS CTAB CARDIAC: RRR, no murmur,no extra heart sounds, no edema GI: Abdomen soft, normal BS, no masses, no tenderness EXTREMITIES;  S/P Bilateral AKA NEUROLOGICAL: There is no tremor. Speech is clear. Alert and oriented X 3. PSYCHIATRIC:  Affect and behavior are appropriate   Labs reviewed: Recent Labs    08/02/18 0240   08/16/18 0335 08/17/18 0332 08/18/18 0416  08/21/18 0345 08/22/18 0339 08/23/18 0321 09/14/18 09/15/18  NA 139   < > 134* 134* 136   < > 137 135 135 138 140  K 3.3*   < > 4.4 4.3 3.8   < > 4.3 3.5 4.3 5.4* 4.3  CL 109   < > 110 110 109   < > 105 101 98  --   --   CO2 23   < > 16* 17* 17*   < > 23 26 28   --   --   GLUCOSE 122*   < > 80 92 95   < > 92 85 96  --   --   BUN 18   < > 27* 21* 15   < > 13 12 14  28* 29*  CREATININE 0.96   < > 0.88 0.69 0.72   < > 0.89 0.70 0.70 0.6 0.6  CALCIUM 8.9   < > 8.1* 8.1* 8.0*   < > 8.7* 8.6* 9.1  --   --   MG 1.7  --  2.0 1.8  --   --   --   --   --   --   --   PHOS  --   --  1.8* 1.5* 2.8  --   --   --   --   --   --    < > = values in this interval not displayed.   Recent Labs    08/17/18 0332 08/18/18 0416 08/20/18 0639  AST 287* 137* 51*  ALT 457* 320* 173*  ALKPHOS 98 95 90  BILITOT 4.0* 3.7* 4.5*  PROT 6.0* 5.8* 5.4*  ALBUMIN 2.5* 2.3* 2.1*   Recent Labs    08/02/18 0240 08/11/18 0101 08/13/18 0245  08/21/18 0345 08/22/18 0339 08/23/18 0321  WBC 12.2* 10.7* 27.0*   < > 12.8* 11.6* 11.0*  NEUTROABS 9.9* 7.6 23.7*  --   --   --   --   HGB 10.5* 10.2* 9.8*   < > 10.0* 10.1* 10.1*  HCT 34.6* 32.3* 33.2*   < > 33.5* 34.0* 33.9*  MCV 84.4 81.8 86.7   < > 83.3 83.1 82.5  PLT 264 335 293   < > 216 232 275   < > = values in this interval not displayed.   Lab Results  Component Value Date   TSH 1.650 07/31/2018   Lab Results  Component Value Date   HGBA1C 5.0 05/30/2018   Lab Results  Component Value Date   CHOL 164 05/20/2018   HDL 40 (L) 05/20/2018   LDLCALC 116 (H) 05/20/2018   TRIG 40 05/20/2018   CHOLHDL 4.1 05/20/2018    Assessment/Plan  1. Hypotension due to drugs - she said that Ultram doesn't really work on her pain, will discontinue Tramadol and Amlodipine   2. Fever in other diseases - has temperature 101.5, no cough nor fever, will check CBC   3. Other chronic pain - will discontinue Tramadol and  continue Oxycodone 5 mg 1 tab Q 6 hours PRN, continue Neurontin 100 mg 1 capsule QID   4. Tobacco abuse - counseled    Family/ staff Communication: Discussed plan of care with resident.  Labs/tests ordered:  CBC  Goals of care:   Long-term care.   Durenda Age, NP [facility] [phone] (Monday-Friday 8:00 a.m. - 5:00 p.m.) [phone] (after hours)

## 2018-10-02 ENCOUNTER — Emergency Department (HOSPITAL_COMMUNITY)
Admission: EM | Admit: 2018-10-02 | Discharge: 2018-10-02 | Disposition: A | Discharge status: Home / Self Care | Payer: Medicaid Other | Attending: Emergency Medicine | Admitting: Emergency Medicine

## 2018-10-02 ENCOUNTER — Emergency Department (HOSPITAL_COMMUNITY): Payer: Medicaid Other

## 2018-10-02 DIAGNOSIS — I5032 Chronic diastolic (congestive) heart failure: Secondary | ICD-10-CM | POA: Diagnosis not present

## 2018-10-02 DIAGNOSIS — I110 Hypertensive heart disease with heart failure: Secondary | ICD-10-CM | POA: Insufficient documentation

## 2018-10-02 DIAGNOSIS — Z79899 Other long term (current) drug therapy: Secondary | ICD-10-CM | POA: Insufficient documentation

## 2018-10-02 DIAGNOSIS — J40 Bronchitis, not specified as acute or chronic: Secondary | ICD-10-CM | POA: Diagnosis not present

## 2018-10-02 DIAGNOSIS — I2510 Atherosclerotic heart disease of native coronary artery without angina pectoris: Secondary | ICD-10-CM | POA: Insufficient documentation

## 2018-10-02 DIAGNOSIS — F17210 Nicotine dependence, cigarettes, uncomplicated: Secondary | ICD-10-CM | POA: Insufficient documentation

## 2018-10-02 DIAGNOSIS — R079 Chest pain, unspecified: Secondary | ICD-10-CM | POA: Diagnosis present

## 2018-10-02 LAB — CBC
HCT: 35.7 % — ABNORMAL LOW (ref 36.0–46.0)
Hemoglobin: 11.4 g/dL — ABNORMAL LOW (ref 12.0–15.0)
MCH: 26.2 pg (ref 26.0–34.0)
MCHC: 31.9 g/dL (ref 30.0–36.0)
MCV: 82.1 fL (ref 80.0–100.0)
Platelets: 191 10*3/uL (ref 150–400)
RBC: 4.35 MIL/uL (ref 3.87–5.11)
RDW: 19.3 % — ABNORMAL HIGH (ref 11.5–15.5)
WBC: 6.7 10*3/uL (ref 4.0–10.5)
nRBC: 0 % (ref 0.0–0.2)

## 2018-10-02 LAB — BASIC METABOLIC PANEL
Anion gap: 9 (ref 5–15)
BUN: 20 mg/dL (ref 6–20)
CO2: 22 mmol/L (ref 22–32)
Calcium: 9.4 mg/dL (ref 8.9–10.3)
Chloride: 105 mmol/L (ref 98–111)
Creatinine, Ser: 0.63 mg/dL (ref 0.44–1.00)
GFR calc non Af Amer: >60 mL/min (ref >60)
Glucose, Bld: 96 mg/dL (ref 70–99)
Potassium: 4.5 mmol/L (ref 3.5–5.1)
Sodium: 136 mmol/L (ref 135–145)

## 2018-10-02 LAB — I-STAT TROPONIN, ED
Troponin i, poc: 0 ng/mL (ref 0.00–0.08)
Troponin i, poc: 0.01 ng/mL (ref 0.00–0.08)

## 2018-10-02 LAB — I-STAT BETA HCG BLOOD, ED (MC, WL, AP ONLY)

## 2018-10-02 MED ORDER — AEROCHAMBER PLUS FLO-VU SMALL MISC
1.0000 | Freq: Once | Status: DC
  Filled 2018-10-02: qty 1

## 2018-10-02 MED ORDER — SODIUM CHLORIDE 0.9 % IV BOLUS
500.0000 mL | Freq: Once | INTRAVENOUS | Status: AC
  Administered 2018-10-02: 500 mL via INTRAVENOUS

## 2018-10-02 MED ORDER — BENZONATATE 100 MG PO CAPS: 100.0000 mg | ORAL_CAPSULE | Freq: Three times a day (TID) | ORAL | 0 refills | Status: DC

## 2018-10-02 MED ORDER — OPTICHAMBER DIAMOND MISC
1.0000 | Freq: Once | Status: AC
  Administered 2018-10-02: 1
  Filled 2018-10-02: qty 1

## 2018-10-02 MED ORDER — IPRATROPIUM BROMIDE 0.02 % IN SOLN
0.5000 mg | Freq: Once | RESPIRATORY_TRACT | Status: AC
  Administered 2018-10-02: 0.5 mg via RESPIRATORY_TRACT
  Filled 2018-10-02: qty 2.5

## 2018-10-02 MED ORDER — ALBUTEROL SULFATE HFA 108 (90 BASE) MCG/ACT IN AERS
2.0000 | INHALATION_SPRAY | RESPIRATORY_TRACT | Status: DC
  Administered 2018-10-02: 2 via RESPIRATORY_TRACT
  Filled 2018-10-02: qty 6.7

## 2018-10-02 MED ORDER — ALBUTEROL SULFATE (2.5 MG/3ML) 0.083% IN NEBU
5.0000 mg | INHALATION_SOLUTION | Freq: Once | RESPIRATORY_TRACT | Status: AC
  Administered 2018-10-02: 5 mg via RESPIRATORY_TRACT
  Filled 2018-10-02: qty 6

## 2018-10-02 NOTE — Discharge Instructions (Addendum)
Thank you for allowing me to care for you today in the Emergency Department.   Use 2 puffs of the albuterol inhaler with a spacer every 4 hours as needed for coughing episodes, wheezing, or shortness of breath.  Take 1 tablet of benzonatate every 8 hours as needed for cough.  If you stop smoking, this should significantly help or resolve your symptoms. You should also stop using crack and cocaine.   You can take ibuprofen with food for pain.  Return to the emergency department if you develop significantly worsening chest pain, high fever that does not improve with ibuprofen, become significantly short of breath, develop persistent vomiting, or other new, concerning symptoms.

## 2018-10-02 NOTE — ED Notes (Signed)
Patient verbalizes understanding of discharge instructions. Opportunity for questioning and answers were provided. Armband removed by staff, pt discharged from ED via wheelchair by self.

## 2018-10-02 NOTE — ED Triage Notes (Signed)
Pt reports CP X few days, states pain is center, non radiating, 8/10. Worsens with cough.

## 2018-10-02 NOTE — ED Provider Notes (Signed)
[facility] EMERGENCY DEPARTMENT Provider Note   CSN: [id number] Arrival date & time: 10/02/18  6237     History   Chief Complaint Chief Complaint  Patient presents with  . Chest Pain    HPI [name] is a 57 y.o. 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  The patient endorses intermittent, non-radiating left-sided chest pain that she characterizes as sharp.  Pain is worse with coughing.  She reports that she developed a productive cough with green and yellow sputum yesterday with mild shortness.  She also endorses mild shortness of breath, subjective fever, chills, and generalized malaise. States she has not measured her temperature at [place].   She denies palpitations, wheezing, abdominal pain, nausea, vomiting, diarrhea, headache, dizziness, or lightheadedness.  She reports that she smokes approximately 6 to 8 cigarettes daily, but "will smoke as much as I can."  She also on states that she smoked crack yesterday.  She denies IV drug use.  No history of PE.  She has not been using an inhaler  The history is provided by the patient. No language interpreter was used.    Past Medical History:  Diagnosis Date  . CAP (community acquired pneumonia) 03/2014 X 2  . Cocaine abuse ([place])    ongoing with resultant vaculitis.  . Depression   . Headache    "weekly" (07/29/2016)  . Hypertension   . Inflammatory arthritis   . Migraines    "probably 5-6/yr" (07/29/2016)  . Normocytic anemia    BL Hgb 9.8-12. Last anemia panel 04/2010 - showing Fe 19, ferritin 101.  Pt on monthly B12 injections  . Rheumatoid arthritis(714.0)    patient reported  . 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 Followed by Dr.  Louanne Skye    Patient Active Problem List   Diagnosis Date Noted  . At risk for adverse drug event 08/30/2018  . Microcytic hypochromic anemia 08/30/2018  . Atherosclerosis of native arteries of extremities with gangrene, left leg ([place])   . Thrombocytopenia ([place]) 08/20/2018  . Toxic encephalopathy 08/20/2018  . Goals of care, counseling/discussion   . Palliative care by specialist   . DNR (do not resuscitate) discussion   . Infiltrate noted on imaging study   . Ulcer of right thigh, limited to breakdown of skin ([place])   . S/P bilateral above knee amputation ([place])   . Homelessness 07/30/2018  . Elevated LFTs 07/30/2018  . Osteomyelitis ([place]) 07/30/2018  . Acute encephalopathy 07/30/2018  . Eye swelling, bilateral 07/30/2018  . Skin rash 07/11/2018  . Acute on chronic diastolic CHF (congestive heart failure) ([place]) 06/21/2018  . Malnutrition of moderate degree 05/20/2018  . Acute congestive heart failure ([place])   . Suicidal ideation 02/02/2018  . HCAP (healthcare-associated pneumonia) 02/02/2018  . Acute on chronic respiratory failure with hypoxia ([place]) 02/02/2018  . Sepsis ([place]) 02/02/2018  . Ulcer of amputation stump of lower extremity ([place]) 10/23/2017  . Cocaine abuse with cocaine-induced mood disorder ([place]) 08/10/2017  . Hypertensive crisis   . Phantom limb pain ([place])   . S/P bilateral below knee amputation ([place]) 04/13/2017  . Tobacco abuse   . Post-operative pain   . Acute blood loss anemia   . Atherosclerosis of native arteries of extremities with gangrene,  bilateral legs ([place])   . Atherosclerosis of native arteries of extremities with gangrene, right leg ([place])   . Wound infection 03/30/2017  . AKI (acute kidney injury) ([place]) 02/07/2017  . Acute kidney injury ([place]) 02/06/2017  . Wound healing, delayed   . Chest pain 04/07/2015  . Protein-calorie malnutrition, severe ([place]) 01/15/2015  . MDD (major depressive disorder), recurrent episode, severe ([place]) 10/16/2014  . Cocaine use  disorder, severe, dependence ([place]) 10/10/2014  . 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 Essential hypertension 02/26/2010    Past Surgical History:  Procedure Laterality Date  . AMPUTATION Left 05/22/2016   Procedure: AMPUTATION LEFT LONG FINGER;  Surgeon: Marybelle Killings, MD;  Location: [facility];  Service: Orthopedics;  Laterality: Left;  . AMPUTATION Bilateral 04/10/2017   Procedure: AMPUTATION BELOW KNEE;  Surgeon: Newt Minion, MD;  Location: [facility];  Service: Orthopedics;  Laterality: Bilateral;  . AMPUTATION Bilateral 02/06/2018   Procedure: AMPUTATION ABOVE KNEE;  Surgeon: Newt Minion, MD;  Location: [facility];  Service: Orthopedics;  Laterality: Bilateral;  . HERNIA REPAIR     "stomach"  . I&D EXTREMITY Right 09/26/2015   Procedure: IRRIGATION AND DEBRIDEMENT LEG WOUND  VAC PLACEMENT.;  Surgeon: Loel Lofty Dillingham, DO;  Location: [facility];  Service: Plastics;  Laterality: Right;  . INCISION AND DRAINAGE OF WOUND Bilateral 10/20/2016   Procedure: IRRIGATION AND DEBRIDEMENT WOUND BILATERAL;  Surgeon: Edrick Kins, DPM;  Location: [facility];  Service: Podiatry;  Laterality: Bilateral;  . IRRIGATION AND DEBRIDEMENT ABSCESS Bilateral 09/26/2013   Procedure: DEBRIDEMENT ULCERS BILATERAL THIGHS;  Surgeon: Gwenyth Ober, MD;  Location: [facility];  Service: General;  Laterality: Bilateral;  . SKIN BIOPSY Bilateral    shin nodules     OB History   No obstetric history on file.      Home Medications    Prior to Admission medications   Medication Sig Start Date End Date Taking? Authorizing Provider  amLODipine (NORVASC) 5 MG tablet Take 5 mg by mouth daily.    [provider]  benzonatate (TESSALON) 100 MG capsule Take 1 capsule (100 mg total) by mouth every 8 (eight) hours. 10/02/18   ,  A, PA-C  feeding supplement, ENSURE ENLIVE, (ENSURE ENLIVE) LIQD Take 237 mLs by mouth 2 (two) times daily between meals. 08/22/18   Debbe Odea, MD  furosemide (LASIX) 40 MG tablet  Take 1 tablet (40 mg total) by mouth daily. 06/23/18   Doreatha Lew, MD  gabapentin (NEURONTIN) 100 MG capsule Take 100 mg by mouth 4 (four) times daily.    [provider]  losartan (COZAAR) 25 MG tablet Take 1 tablet (25 mg total) by mouth daily. 06/23/18   Doreatha Lew, MD  ondansetron (ZOFRAN ODT) 4 MG disintegrating tablet Take 1 tablet (4 mg total) by mouth every 8 (eight) hours as needed for nausea or vomiting. 07/29/18   Montine Circle, PA-C  oxycodone (OXY-IR) 5 MG capsule Take 5 mg by mouth every 6 (six) hours as needed.    [provider]  pantoprazole (PROTONIX) 40 MG tablet Take 1 tablet (40 mg total) by mouth daily. 78/5/88   Delora Fuel, MD  polyethylene glycol [facility] Floria Raveling) packet Take 17 g by mouth daily. 08/22/18   Debbe Odea, MD  traMADol (ULTRAM) 50 MG tablet Take 2 tablets (100 mg total) by mouth 4 (four) times daily. 09/05/18   Medina-Vargas, Senaida Lange, NP    Family History Family History  Problem Relation Age  of Onset  . Breast cancer Mother        Breast cancer  . Alcohol abuse Mother   . Colon cancer Maternal Aunt 26  . Alcohol abuse Father     Social History Social History   Tobacco Use  . Smoking status: Current Every Day Smoker    Packs/day: 0.12    Years: 38.00    Pack years: 4.56    Types: Cigarettes  . Smokeless tobacco: Never Used  . Tobacco comment: 2 A DAY  Substance Use Topics  . Alcohol use: No    Alcohol/week: 0.0 standard drinks  . Drug use: Yes    Types: "Crack" cocaine, Cocaine    Comment: Smoked crack 06/02/2017     Allergies   Levamisole; Acetaminophen; Haldol [haloperidol lactate]; Lisinopril; and Toradol [ketorolac tromethamine]   Review of Systems Review of Systems  Constitutional: Positive for chills and fever. Negative for activity change.  HENT: Negative for congestion and sore throat.   Eyes: Negative for visual disturbance.  Respiratory: Positive for cough. Negative for  shortness of breath.   Cardiovascular: Positive for chest pain. Negative for palpitations and leg swelling.  Gastrointestinal: Negative for abdominal pain, diarrhea, nausea and vomiting.  Genitourinary: Negative for dysuria.  Musculoskeletal: Negative for back pain.  Skin: Negative for rash.  Allergic/Immunologic: Negative for immunocompromised state.  Neurological: Negative for headaches.  Psychiatric/Behavioral: Negative for confusion.   Physical Exam Updated Vital Signs BP 114/80   Pulse (!) 104   Temp 98.1 F (36.7 C) (Oral)   Resp 16   SpO2 100%   Physical Exam Vitals signs and nursing note reviewed.  Constitutional:      General: She is not in acute distress.    Appearance: She is not ill-appearing or toxic-appearing.     Comments: Cachectic, chronically ill-appearing female  HENT:     Head: Normocephalic.  Eyes:     Conjunctiva/sclera: Conjunctivae normal.  Neck:     Musculoskeletal: Normal range of motion and neck supple.     Vascular: No JVD.  Cardiovascular:     Rate and Rhythm: Normal rate and regular rhythm.     Pulses:          Radial pulses are 2+ on the right side and 2+ on the left side.     Heart sounds: Murmur present. Systolic murmur present. No friction rub. No gallop.   Pulmonary:     Effort: Pulmonary effort is normal. No tachypnea or respiratory distress.     Breath sounds: No stridor. No wheezing.     Comments: Coarse breath sounds bilaterally Abdominal:     General: There is no distension or abdominal bruit.     Palpations: Abdomen is soft.  Musculoskeletal:     Comments: Bilateral BKA's  Skin:    General: Skin is warm.     Capillary Refill: Capillary refill takes less than 2 seconds.     Findings: No erythema or rash.  Neurological:     Mental Status: She is alert.  Psychiatric:        Behavior: Behavior normal.    ED Treatments / Results  Labs (all labs ordered are listed, but only abnormal results are displayed) Labs Reviewed    CBC - Abnormal; Notable for the following components:      Result Value   Hemoglobin 11.4 (*)    HCT 35.7 (*)    RDW 19.3 (*)    All other components within normal limits  BASIC METABOLIC PANEL  I-STAT TROPONIN, ED  I-STAT BETA HCG BLOOD, ED ([facility], [facility], [facility] ONLY)  I-STAT TROPONIN, ED    EKG EKG Interpretation  Date/Time:  Sunday October 02 2018 06:02:54 EST Ventricular Rate:  102 PR Interval:  130 QRS Duration: 84 QT Interval:  400 QTC Calculation: 521 R Axis:   91 Text Interpretation:  Sinus tachycardia Biatrial enlargement Rightward axis Pulmonary disease pattern Left ventricular hypertrophy T wave abnormality, consider anterior ischemia Abnormal ECG Confirmed by Bero, Michael ([id number]) on 10/02/2018 9:21:12 AM   Radiology Dg Chest 2 View  Result Date: 10/02/2018 CLINICAL DATA:  Acute onset of central chest pain and cough. EXAM: CHEST - 2 VIEW COMPARISON:  Chest radiograph performed 08/20/2018 FINDINGS: The lungs are well-aerated. Mild scarring is noted at the right midlung zone. Mild peribronchial thickening is noted. There is no evidence of focal opacification, pleural effusion or pneumothorax. Bilateral nipple shadows are noted. The heart is normal in size; the mediastinal contour is within normal limits. No acute osseous abnormalities are seen. IMPRESSION: Mild peribronchial thickening noted. Lungs otherwise grossly clear. Electronically Signed   By: Jeffery  Chang M.D.   On: 10/02/2018 06:28    Procedures Procedures (including critical care time)  Medications Ordered in ED Medications  albuterol (PROVENTIL HFA;VENTOLIN HFA) 108 (90 Base) MCG/ACT inhaler 2 puff (has no administration in time range)  AEROCHAMBER PLUS FLO-VU SMALL device MISC 1 each (has no administration in time range)  albuterol (PROVENTIL) (2.5 MG/3ML) 0.083% nebulizer solution 5 mg (5 mg Nebulization Given 10/02/18 0803)  ipratropium (ATROVENT) nebulizer solution 0.5 mg (0.5 mg Nebulization Given 10/02/18  0803)  sodium chloride 0.9 % bolus 500 mL (500 mLs Intravenous New Bag/Given 10/02/18 0905)     Initial Impression / Assessment and Plan / ED Course  I have reviewed the triage vital signs and the nursing notes.  Pertinent labs & imaging results that were available during my care of the patient were reviewed by me and considered in my medical decision making (see chart for details).     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  She states she states that she smoked 7-8 cigarettes/day, but will smoke anytime she can get her hands on one.  She also states that she smoked crack yesterday.  On arrival, she is tachycardic in the 100s.  Normotensive and satting at 100% with good waveform on room air.  No tachypnea.  She does not have tachycardia at baseline, but I suspect she is mildly dehydrated.  Will order 500 cc fluid bolus given the patient's history of congestive heart failure with preserved ejection fraction.  She is afebrile in the ED but does report subjective fevers.  Of note, per medical chart review on 09/22/2018 she was seen by geriatric medicine and was reported to have a fever of 101 point 5 in the afternoon, but was afebrile at that time 2.  Initial troponin is negative.  Chest x-ray with peribronchial thickening, but otherwise unremarkable.  Labs are otherwise reassuring.  EKG with sinus tachycardia and pulmonary disease pattern versus left ventricular hypertrophy pattern, which appears new from previous.  The patient was discussed and independently evaluated by Dr. Sedonia Small, attending physician.  Per chart review, echo performed on 05/20/2018 with mild LV hypertrophy and EF of 60 to 65% with moderate diastolic dysfunction and  moderate to severe mitral  regurgitation as well as right ventricular pressure volume overload and moderate pulmonary hypertension.  Given the patient's longstanding history of smoking cigarettes, I suspect she has bronchitis.  Low suspicion for influenza, pneumonia, sepsis, PE, or ACS.  However, given that she smoked crack within the last 24 hours in the setting of chest pain, will repeat troponin.  Anticipate discharge back to [facility] if tachycardia resolves with symptomatic treatment.  Recheck.  After nebulizer treatment lung sounds are clear to auscultation bilaterally.  Tachycardia has resolved after fluids.  She is feeling better.  She remains afebrile.  Low suspicion for endocarditis, pericarditis, myocarditis, or acute on chronic congestive heart failure. Repeat troponin is negative. She has been resting in bed comfortably on recheck and is requesting a sandwich and something to drink.  Will discharge home with albuterol inhaler and a spacer along with benzonatate for cough.  Strict return precautions given.  She is hemodynamically stable and in no acute distress.  She is [facility] for [facility] to [facility] with outpatient follow-up at this time.  Final Clinical Impressions(s) / ED Diagnoses   Final diagnoses:  Bronchitis    ED Discharge Orders         Ordered    benzonatate (TESSALON) 100 MG capsule  Every 8 hours     10/02/18 1022           ,  A, PA-C 10/02/18 1041    Maudie Flakes, MD 10/02/18 [phone]

## 2018-10-10 ENCOUNTER — Ambulatory Visit (INDEPENDENT_AMBULATORY_CARE_PROVIDER_SITE_OTHER): Payer: Medicaid Other | Admitting: Orthopedic Surgery

## 2018-10-12 ENCOUNTER — Encounter: Payer: Self-pay | Admitting: Adult Health

## 2018-10-12 ENCOUNTER — Non-Acute Institutional Stay (SKILLED_NURSING_FACILITY): Payer: Medicaid Other | Admitting: Adult Health

## 2018-10-12 DIAGNOSIS — K219 Gastro-esophageal reflux disease without esophagitis: Secondary | ICD-10-CM

## 2018-10-12 DIAGNOSIS — A419 Sepsis, unspecified organism: Secondary | ICD-10-CM

## 2018-10-12 DIAGNOSIS — I10 Essential (primary) hypertension: Secondary | ICD-10-CM | POA: Diagnosis not present

## 2018-10-12 DIAGNOSIS — I5032 Chronic diastolic (congestive) heart failure: Secondary | ICD-10-CM

## 2018-10-12 DIAGNOSIS — G8929 Other chronic pain: Secondary | ICD-10-CM

## 2018-10-12 DIAGNOSIS — R6520 Severe sepsis without septic shock: Secondary | ICD-10-CM

## 2018-10-12 DIAGNOSIS — N179 Acute kidney failure, unspecified: Secondary | ICD-10-CM

## 2018-10-12 MED ORDER — OXYCODONE HCL 5 MG PO CAPS: 5.0000 mg | ORAL_CAPSULE | Freq: Every day | ORAL | 0 refills | Status: AC | PRN

## 2018-10-12 NOTE — Progress Notes (Signed)
Location:  [facility] Room Number: 225-B Place of Service:  SNF (31) Provider:  Durenda Age, NP  Patient Care Team: Azzie Glatter, FNP as PCP - General (Family Medicine)  Extended Emergency Contact Information Primary Emergency Contact: McNair,[name]          [place] Mobile Phone: [phone] Relation: Brother  Code Status:  Full Code  Goals of care: Advanced Directive information Advanced Directives 08/20/2018  Does Patient Have a Medical Advance Directive? No  Type of Advance Directive -  Does patient want to make changes to medical advance directive? -  Would patient like information on creating a medical advance directive? No - Patient declined  Pre-existing out of facility DNR order (yellow form or pink MOST form) -  Some encounter information is confidential and restricted. Go to Review Flowsheets activity to see all data.     Chief Complaint  Patient presents with  . Discharge Note    Patient is to discharge home on 10/13/2018    HPI:  Pt is a 58 y.o. female seen today for discharge.  Plan is for her to discharge to brother's home on 1/9//2020 with home health PT and Nursing services.    She was experiencing hypotension so her antihypertensives were discontinued. She denies dizziness. Her pain is well-controlled and had not taken any PRN Oxycodone. She has a PMH of substance abuse, depression, rheumatoid arthritis, hypertension, anemia, migraines, and levamisole-induced vasculitis.   She had been admitted to [facility] on 08/26/2018 from a recent hospitalization for cellulitis of bilateral AKA stump.  She was treated with IV antibiotics and orthopedics consulted.  Her stump wounds are now healed.  Patient was admitted to this facility for short-term rehabilitation after the patient's recent hospitalization.  Patient has completed SNF rehabilitation and therapy has cleared the  patient for discharge.   Past Medical History:  Diagnosis Date  . CAP (community acquired pneumonia) 03/2014 X 2  . Cocaine abuse ([place])    ongoing with resultant vaculitis.  . Depression   . Headache    "weekly" (07/29/2016)  . Hypertension   . Inflammatory arthritis   . Migraines    "probably 5-6/yr" (07/29/2016)  . Normocytic anemia    BL Hgb 9.8-12. Last anemia panel 04/2010 - showing Fe 19, ferritin 101.  Pt on monthly B12 injections  . Rheumatoid arthritis(714.0)    patient reported  . 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 Followed by Dr. Louanne Skye   Past Surgical History:  Procedure Laterality Date  . AMPUTATION Left 05/22/2016   Procedure: AMPUTATION LEFT LONG FINGER;  Surgeon: Marybelle Killings, MD;  Location: [facility];  Service: Orthopedics;  Laterality: Left;  . AMPUTATION Bilateral 04/10/2017   Procedure: AMPUTATION BELOW KNEE;  Surgeon: Newt Minion, MD;  Location: [place];  Service: Orthopedics;  Laterality: Bilateral;  . AMPUTATION Bilateral 02/06/2018   Procedure: AMPUTATION ABOVE KNEE;  Surgeon: Newt Minion, MD;  Location: [facility];  Service: Orthopedics;  Laterality: Bilateral;  . HERNIA REPAIR     "stomach"  . I&D EXTREMITY Right 09/26/2015   Procedure: IRRIGATION AND DEBRIDEMENT LEG WOUND  VAC PLACEMENT.;  Surgeon: Loel Lofty Dillingham, DO;  Location: [facility];  Service: Plastics;  Laterality: Right;  . INCISION AND DRAINAGE OF WOUND Bilateral 10/20/2016   Procedure: IRRIGATION AND DEBRIDEMENT WOUND BILATERAL;  Surgeon: Edrick Kins, DPM;  Location: [place] [facility];  Service:  Podiatry;  Laterality: Bilateral;  . IRRIGATION AND DEBRIDEMENT ABSCESS Bilateral 09/26/2013   Procedure: DEBRIDEMENT ULCERS BILATERAL THIGHS;  Surgeon: Gwenyth Ober, MD;  Location: [facility];  Service: General;  Laterality: Bilateral;  . SKIN BIOPSY Bilateral    shin nodules    Allergies  Allergen Reactions  . Levamisole     Levamisole is a chemical  adulterant of cocaine which can cause cutaneous necrosis It is used as an Health and safety inspector in veterinary medicine  . Acetaminophen Swelling and Other (See Comments)    Reaction:  Eyelid swelling  . Haldol [Haloperidol Lactate]     Possible eye swelling was given together with Toradol unsure if caused the reaction  . Lisinopril     UNSPECIFIED REACTION   . Toradol [Ketorolac Tromethamine]     Eye swelling    Outpatient Encounter Medications as of 10/12/2018  Medication Sig  . amLODipine (NORVASC) 5 MG tablet Take 5 mg by mouth daily.  Marland Kitchen gabapentin (NEURONTIN) 100 MG capsule Take 100 mg by mouth 3 (three) times daily.   Marland Kitchen ibuprofen (ADVIL,MOTRIN) 200 MG tablet Take 400 mg by mouth every 6 (six) hours as needed for fever. Take 2 tablets to = 400 mg  . ondansetron (ZOFRAN ODT) 4 MG disintegrating tablet Take 1 tablet (4 mg total) by mouth every 8 (eight) hours as needed for nausea or vomiting.  Marland Kitchen oxycodone (OXY-IR) 5 MG capsule Take 5 mg by mouth every 6 (six) hours as needed.  . pantoprazole (PROTONIX) 40 MG tablet Take 1 tablet (40 mg total) by mouth daily.  . polyethylene glycol (MIRALAX / GLYCOLAX) packet Take 17 g by mouth daily.  . [DISCONTINUED] benzonatate (TESSALON) 100 MG capsule Take 1 capsule (100 mg total) by mouth every 8 (eight) hours.  . [DISCONTINUED] feeding supplement, ENSURE ENLIVE, (ENSURE ENLIVE) LIQD Take 237 mLs by mouth 2 (two) times daily between meals.  . [DISCONTINUED] furosemide (LASIX) 40 MG tablet Take 1 tablet (40 mg total) by mouth daily.  . [DISCONTINUED] losartan (COZAAR) 25 MG tablet Take 1 tablet (25 mg total) by mouth daily.  . [DISCONTINUED] traMADol (ULTRAM) 50 MG tablet Take 2 tablets (100 mg total) by mouth 4 (four) times daily.   No facility-administered encounter medications on file as of 10/12/2018.     Review of Systems  GENERAL: No change in appetite, no fatigue, no weight changes, no fever, chills or weakness MOUTH and THROAT: Denies oral  discomfort, gingival pain or bleeding RESPIRATORY: no cough, SOB, DOE, wheezing, hemoptysis CARDIAC: No chest pain, edema or palpitations GI: No abdominal pain, diarrhea, constipation, heart burn, nausea or vomiting GU: Denies dysuria, frequency, hematuria, incontinence, or discharge PSYCHIATRIC: Denies feelings of depression or anxiety. No report of hallucinations, insomnia, paranoia, or agitation    Immunization History  Administered Date(s) Administered  . Influenza Whole 09/15/2010  . Influenza,inj,Quad PF,6+ Mos 10/11/2014, 10/24/2017  . Influenza-Unspecified 07/06/2011  . Pneumococcal Polysaccharide-23 09/15/2010, 10/11/2014  . Td 09/15/2010  . Tdap 10/11/2013   Pertinent  Health Maintenance Due  Topic Date Due  . COLONOSCOPY  04/17/2013  . PAP SMEAR-Modifier  09/18/2013  . INFLUENZA VACCINE  05/05/2018  . MAMMOGRAM  09/23/2019 (Originally 04/19/2015)   Fall Risk  05/30/2018 07/26/2017 04/27/2017 02/01/2017 01/20/2017  Falls in the past year? Yes Yes No No No  Comment - - - - -  Number falls in past yr: 1 1 - - -  Injury with Fall? No Yes - - -  Risk Factor Category  - - - - -  Risk for fall due to : - - - - -  Risk for fall due to: Comment - - - - -  Follow up - - - - -     Vitals:   10/12/18 0947  BP: 91/61  Pulse: 91  Resp: 20  Temp: 97.7 F (36.5 C)  TempSrc: Oral  SpO2: 95%  Weight: 79 lb 9.6 oz (36.1 kg)  Height: 5' 1"  (1.549 m)   Body mass index is 15.04 kg/m.  Physical Exam  GENERAL APPEARANCE:  In no acute distress.  SKIN:  Bilateral AKA stump surgical wound healed MOUTH and THROAT: Lips are without lesions. Oral mucosa is moist and without lesions. Tongue is normal in shape, size, and color and without lesions RESPIRATORY: Breathing is even & unlabored, BS CTAB CARDIAC: RRR, no murmur,no extra heart sounds, no edema GI: Abdomen soft, normal BS, no masses, no tenderness EXTREMITIES: Able to move X 4 , bilateral AKA NEUROLOGICAL: There is no  tremor. Speech is clear. Alert and oriented X 3. PSYCHIATRIC:  Affect and behavior are appropriate  Labs reviewed: Recent Labs    08/02/18 0240  08/16/18 0335 08/17/18 0332 08/18/18 0416  08/22/18 0339 08/23/18 0321 09/14/18 09/15/18 10/02/18 0535  NA 139   < > 134* 134* 136   < > 135 135 138 140 136  K 3.3*   < > 4.4 4.3 3.8   < > 3.5 4.3 5.4* 4.3 4.5  CL 109   < > 110 110 109   < > 101 98  --   --  105  CO2 23   < > 16* 17* 17*   < > 26 28  --   --  22  GLUCOSE 122*   < > 80 92 95   < > 85 96  --   --  96  BUN 18   < > 27* 21* 15   < > 12 14 28* 29* 20  CREATININE 0.96   < > 0.88 0.69 0.72   < > 0.70 0.70 0.6 0.6 0.63  CALCIUM 8.9   < > 8.1* 8.1* 8.0*   < > 8.6* 9.1  --   --  9.4  MG 1.7  --  2.0 1.8  --   --   --   --   --   --   --   PHOS  --   --  1.8* 1.5* 2.8  --   --   --   --   --   --    < > = values in this interval not displayed.   Recent Labs    08/17/18 0332 08/18/18 0416 08/20/18 0639  AST 287* 137* 51*  ALT 457* 320* 173*  ALKPHOS 98 95 90  BILITOT 4.0* 3.7* 4.5*  PROT 6.0* 5.8* 5.4*  ALBUMIN 2.5* 2.3* 2.1*   Recent Labs    08/02/18 0240 08/11/18 0101 08/13/18 0245  08/22/18 0339 08/23/18 0321 10/02/18 0535  WBC 12.2* 10.7* 27.0*   < > 11.6* 11.0* 6.7  NEUTROABS 9.9* 7.6 23.7*  --   --   --   --   HGB 10.5* 10.2* 9.8*   < > 10.1* 10.1* 11.4*  HCT 34.6* 32.3* 33.2*   < > 34.0* 33.9* 35.7*  MCV 84.4 81.8 86.7   < > 83.1 82.5 82.1  PLT 264 335 293   < > 232 275 191   < > = values in this interval not displayed.  Lab Results  Component Value Date   TSH 1.650 07/31/2018   Lab Results  Component Value Date   HGBA1C 5.0 05/30/2018   Lab Results  Component Value Date   CHOL 164 05/20/2018   HDL 40 (L) 05/20/2018   LDLCALC 116 (H) 05/20/2018   TRIG 40 05/20/2018   CHOLHDL 4.1 05/20/2018    Significant Diagnostic Results in last 30 days:  Dg Chest 2 View  Result Date: 10/02/2018 CLINICAL DATA:  Acute onset of central chest pain and  cough. EXAM: CHEST - 2 VIEW COMPARISON:  Chest radiograph performed 08/20/2018 FINDINGS: The lungs are well-aerated. Mild scarring is noted at the right midlung zone. Mild peribronchial thickening is noted. There is no evidence of focal opacification, pleural effusion or pneumothorax. Bilateral nipple shadows are noted. The heart is normal in size; the mediastinal contour is within normal limits. No acute osseous abnormalities are seen. IMPRESSION: Mild peribronchial thickening noted. Lungs otherwise grossly clear. Electronically Signed   By: Garald Balding M.D.   On: 10/02/2018 06:28    Assessment/Plan  1. Sepsis with acute renal failure without septic shock, due to unspecified organism, unspecified acute renal failure type ([place]) - resolved   2. Chronic diastolic heart failure (HCC) - euvolemic, Lasix and Losartan were discontinued due to hypotension.   3. Essential hypertension - will discontinue Amlodipine due to hypotension, BP 91/61   4. Other chronic pain  - well-controlled -Continue gabapentin 100 mg 1 capsule 3 times a day and Motrin IB 200 mg 2 tabs = 400 mg every 6 hours PRN - oxycodone (OXY-IR) 5 MG capsule; Take 1 capsule (5 mg total) by mouth daily as needed for up to 7 days for pain.  Dispense: 7 capsule; Refill: 0   5. GERD -continue pantoprazole 40 mg 1 tab daily     I have filled out patient's discharge paperwork and written prescriptions.  Patient will receive home health PT and Nursing. .  DME provided:  3-in-1 bedside commode  Total discharge time: Greater than 30 minutes Greater than 50% was spent in counseling and coordination of care.  Discharge time involved coordination of the discharge process with social worker, nursing staff and therapy department. Medical justification for home health services/DME verified.   Durenda Age, NP [facility] [phone] (Monday-Friday 8:00 a.m. - 5:00 p.m.) [phone] (after  hours)

## 2018-10-13 ENCOUNTER — Ambulatory Visit (INDEPENDENT_AMBULATORY_CARE_PROVIDER_SITE_OTHER): Payer: Medicaid Other | Admitting: Orthopedic Surgery

## 2018-10-19 ENCOUNTER — Ambulatory Visit (INDEPENDENT_AMBULATORY_CARE_PROVIDER_SITE_OTHER): Payer: Medicaid Other | Admitting: Orthopedic Surgery

## 2018-10-19 ENCOUNTER — Encounter (INDEPENDENT_AMBULATORY_CARE_PROVIDER_SITE_OTHER): Payer: Self-pay | Admitting: Family

## 2018-10-19 ENCOUNTER — Ambulatory Visit (INDEPENDENT_AMBULATORY_CARE_PROVIDER_SITE_OTHER): Payer: Medicaid Other | Admitting: Family

## 2018-10-19 DIAGNOSIS — Z89611 Acquired absence of right leg above knee: Secondary | ICD-10-CM | POA: Diagnosis not present

## 2018-10-19 DIAGNOSIS — Z89612 Acquired absence of left leg above knee: Secondary | ICD-10-CM | POA: Diagnosis not present

## 2018-10-19 NOTE — Progress Notes (Signed)
Office Visit Note   Patient: [name]           Date of Birth: August 18, 1961           MRN: [id number] Visit Date: 10/19/2018              Requested by: Azzie Glatter, [place], [place] [id number] PCP: Azzie Glatter, FNP  No chief complaint on file.     HPI: The patient is a 58 year old woman who is seen today for concerns related to mobility. Is status post remote bilateral AKAs. Is in manual wheel chair. States this does not suit her needs. States she requires further assistance for her ADLs and IADLs. Would like a power wheel chair. Does not nor has she ever had prostheses. Is not interested in obtaining these at this time.  No concerns otherwise. No wounds to stumps.   Assessment & Plan: Visit Diagnoses:  1. S/P bilateral above knee amputation ([place])     Plan: provided order for electric wheel chair. Will follow up in office as needed. Can fax further paperwork if needed.  Follow-Up Instructions: Return if symptoms worsen or fail to improve.   Ortho Exam  Patient is alert, oriented, no adenopathy, well-dressed, normal affect, normal respiratory effort. On examination bilateral AKA incisions are well healed. Limbs well consolidated. No breakdown or impending break down.  Imaging: No results found. No images are attached to the encounter.  Labs: Lab Results  Component Value Date   HGBA1C 5.0 05/30/2018   HGBA1C 5.5 03/30/2017   HGBA1C 5.1 04/21/2015   ESRSEDRATE 8 07/11/2018   ESRSEDRATE 104 (H) 04/08/2017   ESRSEDRATE 67 (H) 12/23/2016   CRP 5.3 (H) 08/02/2018   CRP 1.5 (H) 07/11/2018   CRP 2.0 (H) 04/08/2017   LABURIC 3.7 10/29/2010   LABURIC 3.4 10/22/2010   LABURIC 2.9 05/31/2009   REPTSTATUS 08/18/2018 FINAL 08/13/2018   GRAMSTAIN  10/23/2017    MODERATE WBC PRESENT, PREDOMINANTLY PMN MODERATE GRAM POSITIVE COCCI IN PAIRS IN CLUSTERS RARE GRAM NEGATIVE COCCOBACILLI    CULT  08/13/2018    NO GROWTH 5 DAYS Performed at  [facility], [place] [address] [place], [place] [address]    LABORGA METHICILLIN RESISTANT STAPHYLOCOCCUS AUREUS 10/23/2017     Lab Results  Component Value Date   ALBUMIN 2.1 (L) 08/20/2018   ALBUMIN 2.3 (L) 08/18/2018   ALBUMIN 2.5 (L) 08/17/2018   PREALBUMIN 13.6 (L) 04/08/2017   PREALBUMIN 14.7 (L) 10/11/2013   LABURIC 3.7 10/29/2010   LABURIC 3.4 10/22/2010   LABURIC 2.9 05/31/2009    There is no height or weight on file to calculate BMI.  Orders:  No orders of the defined types were placed in this encounter.  No orders of the defined types were placed in this encounter.    Procedures: No procedures performed  Clinical Data: No additional findings.  ROS:  All other systems negative, except as noted in the HPI. Review of Systems  Constitutional: Negative for chills and fever.  Musculoskeletal: Negative for arthralgias.  Skin: Negative for color change and wound.    Objective: Vital Signs: There were no vitals taken for this visit.  Specialty Comments:  No specialty comments available.  PMFS History: Patient Active Problem List   Diagnosis Date Noted  . At risk for adverse drug event 08/30/2018  . Microcytic hypochromic anemia 08/30/2018  . Thrombocytopenia ([place]) 08/20/2018  . Toxic encephalopathy 08/20/2018  . Goals of  care, counseling/discussion   . Palliative care by specialist   . DNR (do not resuscitate) discussion   . Infiltrate noted on imaging study   . Ulcer of right thigh, limited to breakdown of skin ([place])   . S/P bilateral above knee amputation ([place])   . Homelessness 07/30/2018  . Elevated LFTs 07/30/2018  . Osteomyelitis ([place]) 07/30/2018  . Acute encephalopathy 07/30/2018  . Eye swelling, bilateral 07/30/2018  . Skin rash 07/11/2018  . Acute on chronic diastolic CHF (congestive heart failure) ([place]) 06/21/2018  . Malnutrition of moderate degree 05/20/2018  . Acute congestive heart failure ([place])   . Suicidal ideation 02/02/2018    . HCAP (healthcare-associated pneumonia) 02/02/2018  . Acute on chronic respiratory failure with hypoxia ([place]) 02/02/2018  . Sepsis ([place]) 02/02/2018  . Ulcer of amputation stump of lower extremity ([place]) 10/23/2017  . Cocaine abuse with cocaine-induced mood disorder ([place]) 08/10/2017  . Hypertensive crisis   . Phantom limb pain ([place])   . Tobacco abuse   . Post-operative pain   . Acute blood loss anemia   . 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 AKI (acute kidney injury) ([place]) 02/07/2017  . Acute kidney injury ([place]) 02/06/2017  . Wound healing, delayed   . Chest pain 04/07/2015  . Protein-calorie malnutrition, severe ([place]) 01/15/2015  . MDD (major depressive disorder), recurrent episode, severe ([place]) 10/16/2014  . Cocaine use disorder, severe, dependence ([place]) 10/10/2014  . 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 Essential hypertension 02/26/2010   Past Medical History:  Diagnosis Date  . CAP (community acquired pneumonia) 03/2014 X 2  . Cocaine abuse ([place])    ongoing with resultant vaculitis.  . Depression   . Headache    "weekly" (07/29/2016)  . Hypertension   . Inflammatory arthritis   . Migraines    "probably 5-6/yr" (07/29/2016)  . Normocytic anemia    BL Hgb 9.8-12. Last anemia panel 04/2010 - showing Fe 19, ferritin 101.  Pt on monthly B12 injections  . Rheumatoid arthritis(714.0)    patient reported  . S/P bilateral below knee amputation ([place]) 04/13/2017  . 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 Followed by Dr. Louanne Skye    Family History  Problem Relation Age of Onset  . Breast cancer Mother        Breast cancer  . Alcohol abuse Mother   . Colon cancer Maternal Aunt 72  . Alcohol abuse Father     Past Surgical History:  Procedure Laterality Date  . AMPUTATION Left 05/22/2016   Procedure: AMPUTATION LEFT LONG FINGER;  Surgeon: Marybelle Killings, MD;   Location: [place];  Service: Orthopedics;  Laterality: Left;  . AMPUTATION Bilateral 04/10/2017   Procedure: AMPUTATION BELOW KNEE;  Surgeon: Newt Minion, MD;  Location: [facility];  Service: Orthopedics;  Laterality: Bilateral;  . AMPUTATION Bilateral 02/06/2018   Procedure: AMPUTATION ABOVE KNEE;  Surgeon: Newt Minion, MD;  Location: [place];  Service: Orthopedics;  Laterality: Bilateral;  . HERNIA REPAIR     "stomach"  . I&D EXTREMITY Right 09/26/2015   Procedure: IRRIGATION AND DEBRIDEMENT LEG WOUND  VAC PLACEMENT.;  Surgeon: Loel Lofty Dillingham, DO;  Location: [place];  Service: Plastics;  Laterality: Right;  . INCISION AND DRAINAGE OF WOUND Bilateral 10/20/2016   Procedure: IRRIGATION AND DEBRIDEMENT WOUND BILATERAL;  Surgeon: Edrick Kins, DPM;  Location: [facility];  Service: Podiatry;  Laterality:  Bilateral;  . IRRIGATION AND DEBRIDEMENT ABSCESS Bilateral 09/26/2013   Procedure: DEBRIDEMENT ULCERS BILATERAL THIGHS;  Surgeon: Gwenyth Ober, MD;  Location: [facility];  Service: General;  Laterality: Bilateral;  . SKIN BIOPSY Bilateral    shin nodules   Social History   Occupational History  . Occupation: Disability    Comment: since 2011, due to her rheumatoid arthritis  Tobacco Use  . Smoking status: Current Every Day Smoker    Packs/day: 0.12    Years: 38.00    Pack years: 4.56    Types: Cigarettes  . Smokeless tobacco: Never Used  . Tobacco comment: 2 A DAY  Substance and Sexual Activity  . Alcohol use: No    Alcohol/week: 0.0 standard drinks  . Drug use: Yes    Types: "Crack" cocaine, Cocaine    Comment: Smoked crack 06/02/2017  . Sexual activity: Not Currently

## 2018-10-21 ENCOUNTER — Ambulatory Visit: Payer: Self-pay | Admitting: Family Medicine

## 2018-11-02 ENCOUNTER — Ambulatory Visit (INDEPENDENT_AMBULATORY_CARE_PROVIDER_SITE_OTHER): Payer: Medicaid Other | Admitting: Family

## 2018-12-05 ENCOUNTER — Encounter (HOSPITAL_COMMUNITY): Payer: Self-pay | Admitting: Emergency Medicine

## 2018-12-05 ENCOUNTER — Other Ambulatory Visit: Payer: Self-pay

## 2018-12-05 ENCOUNTER — Emergency Department (HOSPITAL_COMMUNITY)
Admission: EM | Admit: 2018-12-05 | Discharge: 2018-12-05 | Disposition: A | Discharge status: Home / Self Care | Payer: Medicaid Other | Attending: Emergency Medicine | Admitting: Emergency Medicine

## 2018-12-05 DIAGNOSIS — Z89022 Acquired absence of left finger(s): Secondary | ICD-10-CM | POA: Diagnosis not present

## 2018-12-05 DIAGNOSIS — Z89511 Acquired absence of right leg below knee: Secondary | ICD-10-CM | POA: Diagnosis not present

## 2018-12-05 DIAGNOSIS — Z89512 Acquired absence of left leg below knee: Secondary | ICD-10-CM | POA: Insufficient documentation

## 2018-12-05 DIAGNOSIS — F17210 Nicotine dependence, cigarettes, uncomplicated: Secondary | ICD-10-CM | POA: Diagnosis not present

## 2018-12-05 DIAGNOSIS — L03112 Cellulitis of left axilla: Secondary | ICD-10-CM | POA: Diagnosis not present

## 2018-12-05 DIAGNOSIS — I10 Essential (primary) hypertension: Secondary | ICD-10-CM | POA: Insufficient documentation

## 2018-12-05 DIAGNOSIS — L03111 Cellulitis of right axilla: Secondary | ICD-10-CM | POA: Diagnosis not present

## 2018-12-05 DIAGNOSIS — L03119 Cellulitis of unspecified part of limb: Secondary | ICD-10-CM

## 2018-12-05 DIAGNOSIS — R21 Rash and other nonspecific skin eruption: Secondary | ICD-10-CM | POA: Diagnosis present

## 2018-12-05 DIAGNOSIS — B359 Dermatophytosis, unspecified: Secondary | ICD-10-CM | POA: Diagnosis not present

## 2018-12-05 LAB — COMPREHENSIVE METABOLIC PANEL
ALK PHOS: 89 U/L (ref 38–126)
ALT: 17 U/L (ref 0–44)
AST: 25 U/L (ref 15–41)
Albumin: 3.8 g/dL (ref 3.5–5.0)
Anion gap: 6 (ref 5–15)
BUN: 19 mg/dL (ref 6–20)
CO2: 22 mmol/L (ref 22–32)
Calcium: 8.9 mg/dL (ref 8.9–10.3)
Chloride: 110 mmol/L (ref 98–111)
Creatinine, Ser: 0.8 mg/dL (ref 0.44–1.00)
GFR calc Af Amer: >60 mL/min (ref >60)
Glucose, Bld: 112 mg/dL — ABNORMAL HIGH (ref 70–99)
Potassium: 3.1 mmol/L — ABNORMAL LOW (ref 3.5–5.1)
Sodium: 138 mmol/L (ref 135–145)
Total Bilirubin: 0.8 mg/dL (ref 0.3–1.2)
Total Protein: 7.5 g/dL (ref 6.5–8.1)

## 2018-12-05 LAB — CBC WITH DIFFERENTIAL/PLATELET
Abs Immature Granulocytes: 0.04 10*3/uL (ref 0.00–0.07)
Basophils Absolute: 0 10*3/uL (ref 0.0–0.1)
Basophils Relative: 0 %
Eosinophils Absolute: 0.1 10*3/uL (ref 0.0–0.5)
Eosinophils Relative: 1 %
HCT: 31.1 % — ABNORMAL LOW (ref 36.0–46.0)
HEMOGLOBIN: 10.5 g/dL — AB (ref 12.0–15.0)
Immature Granulocytes: 1 %
Lymphocytes Relative: 21 %
Lymphs Abs: 1.7 10*3/uL (ref 0.7–4.0)
MCH: 30.1 pg (ref 26.0–34.0)
MCHC: 33.8 g/dL (ref 30.0–36.0)
MCV: 89.1 fL (ref 80.0–100.0)
MONOS PCT: 6 %
Monocytes Absolute: 0.5 10*3/uL (ref 0.1–1.0)
Neutro Abs: 5.5 10*3/uL (ref 1.7–7.7)
Neutrophils Relative %: 71 %
Platelets: 249 10*3/uL (ref 150–400)
RBC: 3.49 MIL/uL — ABNORMAL LOW (ref 3.87–5.11)
RDW: 15.8 % — ABNORMAL HIGH (ref 11.5–15.5)
WBC: 7.8 10*3/uL (ref 4.0–10.5)
nRBC: 0 % (ref 0.0–0.2)

## 2018-12-05 MED ORDER — LIDOCAINE 5 % EX OINT: 1.0000 "application " | TOPICAL_OINTMENT | Freq: Four times a day (QID) | CUTANEOUS | 0 refills | Status: DC | PRN

## 2018-12-05 MED ORDER — CLOTRIMAZOLE-BETAMETHASONE 1-0.05 % EX CREA: TOPICAL_CREAM | CUTANEOUS | 0 refills | Status: DC

## 2018-12-05 MED ORDER — LIDOCAINE HCL URETHRAL/MUCOSAL 2 % EX GEL
1.0000 "application " | Freq: Once | CUTANEOUS | Status: AC
  Administered 2018-12-05: 1 via TOPICAL
  Filled 2018-12-05: qty 5

## 2018-12-05 MED ORDER — DOXYCYCLINE HYCLATE 100 MG PO CAPS: 100.0000 mg | ORAL_CAPSULE | Freq: Two times a day (BID) | ORAL | 0 refills | Status: DC

## 2018-12-05 NOTE — ED Notes (Signed)
[facility] bedside.

## 2018-12-05 NOTE — ED Notes (Signed)
Patient verbalizes understanding of discharge instructions. Opportunity for questioning and answers were provided. Armband removed by staff, pt discharged from ED.  

## 2018-12-05 NOTE — ED Notes (Signed)
Patient given graham crackers and sprite while waiting for [facility] transport.

## 2018-12-05 NOTE — ED Triage Notes (Signed)
Patient is from home. Pt has burning rash on both arm pits. The pain started a couple hours ago. Pt stated rash began yesterday.   Pt has both legs amputated above the knee.   Hx of cocaine use.   BP 138/78, HR 90.

## 2018-12-05 NOTE — ED Notes (Signed)
[facility] notified of need for transport.

## 2018-12-05 NOTE — ED Notes (Signed)
Bed: FU07 Expected date:  Expected time:  Means of arrival:  Comments: shingles

## 2018-12-05 NOTE — ED Provider Notes (Signed)
[facility] DEPT Provider Note   CSN: [id number] Arrival date & time: 12/05/18  0152    History   Chief Complaint Chief Complaint  Patient presents with  . Herpes Zoster    HPI [name] is a 58 y.o. female.     Patient presents to the emergency department for evaluation of a rash in both of her armpits.  Patient first noticed the rash in the last day or so but tonight started having severe pain.  She comes to the ER by ambulance.     Past Medical History:  Diagnosis Date  . CAP (community acquired pneumonia) 03/2014 X 2  . Cocaine abuse ([place])    ongoing with resultant vaculitis.  . Depression   . Headache    "weekly" (07/29/2016)  . Hypertension   . Inflammatory arthritis   . Migraines    "probably 5-6/yr" (07/29/2016)  . Normocytic anemia    BL Hgb 9.8-12. Last anemia panel 04/2010 - showing Fe 19, ferritin 101.  Pt on monthly B12 injections  . Rheumatoid arthritis(714.0)    patient reported  . S/P bilateral below knee amputation ([place]) 04/13/2017  . 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 Followed by Dr. Louanne Skye    Patient Active Problem List   Diagnosis Date Noted  . At risk for adverse drug event 08/30/2018  . Microcytic hypochromic anemia 08/30/2018  . Thrombocytopenia ([place]) 08/20/2018  . Toxic encephalopathy 08/20/2018  . Goals of care, counseling/discussion   . Palliative care by specialist   . DNR (do not resuscitate) discussion   . Infiltrate noted on imaging study   . Ulcer of right thigh, limited to breakdown of skin ([place])   . S/P bilateral above knee amputation ([place])   . Homelessness 07/30/2018  . Elevated LFTs 07/30/2018  . Osteomyelitis ([place]) 07/30/2018  . Acute encephalopathy 07/30/2018  . Eye swelling, bilateral 07/30/2018  . Skin rash 07/11/2018  . Acute on chronic diastolic CHF (congestive heart failure) ([place]) 06/21/2018  . Malnutrition of moderate  degree 05/20/2018  . Acute congestive heart failure ([place])   . Suicidal ideation 02/02/2018  . HCAP (healthcare-associated pneumonia) 02/02/2018  . Acute on chronic respiratory failure with hypoxia ([place]) 02/02/2018  . Sepsis ([place]) 02/02/2018  . Ulcer of amputation stump of lower extremity ([place]) 10/23/2017  . Cocaine abuse with cocaine-induced mood disorder ([place]) 08/10/2017  . Hypertensive crisis   . Phantom limb pain ([place])   . Tobacco abuse   . Post-operative pain   . Acute blood loss anemia   . 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 AKI (acute kidney injury) ([place]) 02/07/2017  . Acute kidney injury ([place]) 02/06/2017  . Wound healing, delayed   . Chest pain 04/07/2015  . Protein-calorie malnutrition, severe ([place]) 01/15/2015  . MDD (major depressive disorder), recurrent episode, severe ([place]) 10/16/2014  . Cocaine use disorder, severe, dependence ([place]) 10/10/2014  . 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 Essential hypertension 02/26/2010    Past Surgical History:  Procedure Laterality Date  . AMPUTATION Left 05/22/2016   Procedure: AMPUTATION LEFT LONG FINGER;  Surgeon: Marybelle Killings, MD;  Location: [facility];  Service: Orthopedics;  Laterality: Left;  . AMPUTATION Bilateral 04/10/2017   Procedure: AMPUTATION BELOW KNEE;  Surgeon: Newt Minion, MD;  Location: [facility];  Service: Orthopedics;  Laterality: Bilateral;  . AMPUTATION Bilateral 02/06/2018   Procedure: AMPUTATION ABOVE  KNEE;  Surgeon: Newt Minion, MD;  Location: [facility];  Service: Orthopedics;  Laterality: Bilateral;  . HERNIA REPAIR     "stomach"  . I&D EXTREMITY Right 09/26/2015   Procedure: IRRIGATION AND DEBRIDEMENT LEG WOUND  VAC PLACEMENT.;  Surgeon: Loel Lofty Dillingham, DO;  Location: [facility];  Service: Plastics;  Laterality: Right;  . INCISION AND DRAINAGE OF WOUND Bilateral 10/20/2016   Procedure: IRRIGATION AND DEBRIDEMENT WOUND BILATERAL;  Surgeon: Edrick Kins, DPM;  Location: [facility];  Service: Podiatry;  Laterality: Bilateral;  . IRRIGATION AND DEBRIDEMENT ABSCESS Bilateral 09/26/2013   Procedure: DEBRIDEMENT ULCERS BILATERAL THIGHS;  Surgeon: Gwenyth Ober, MD;  Location: [facility];  Service: General;  Laterality: Bilateral;  . SKIN BIOPSY Bilateral    shin nodules     OB History   No obstetric history on file.      Home Medications    Prior to Admission medications   Medication Sig Start Date End Date Taking? Authorizing Provider  ondansetron (ZOFRAN ODT) 4 MG disintegrating tablet Take 1 tablet (4 mg total) by mouth every 8 (eight) hours as needed for nausea or vomiting. Patient not taking: Reported on 12/05/2018 07/29/18   Montine Circle, PA-C  pantoprazole (PROTONIX) 40 MG tablet Take 1 tablet (40 mg total) by mouth daily. Patient not taking: Reported on 01/09/4258 56/3/87   Delora Fuel, MD  polyethylene glycol [facility] / Floria Raveling) packet Take 17 g by mouth daily. Patient not taking: Reported on 12/05/2018 08/22/18   Debbe Odea, MD    Family History Family History  Problem Relation Age of Onset  . Breast cancer Mother        Breast cancer  . Alcohol abuse Mother   . Colon cancer Maternal Aunt 3  . Alcohol abuse Father     Social History Social History   Tobacco Use  . Smoking status: Current Every Day Smoker    Packs/day: 0.12    Years: 38.00    Pack years: 4.56    Types: Cigarettes  . Smokeless tobacco: Never Used  . Tobacco comment: 2 A DAY  Substance Use Topics  . Alcohol use: No    Alcohol/week: 0.0 standard drinks  . Drug use: Yes    Types: "Crack" cocaine, Cocaine    Comment: Smoked crack 06/02/2017     Allergies   Levamisole; Acetaminophen; Haldol [haloperidol lactate]; Lisinopril; and Toradol [ketorolac tromethamine]   Review of Systems Review of Systems  Skin: Positive for rash.  All other systems reviewed and are negative.    Physical Exam Updated Vital Signs BP (!) 164/98   Pulse 98    Temp 98.8 F (37.1 C) (Oral)   Resp (!) 21   SpO2 97%   Physical Exam Vitals signs and nursing note reviewed.  Constitutional:      General: She is in acute distress (crying).     Appearance: Normal appearance. She is well-developed.  HENT:     Head: Normocephalic and atraumatic.     Right Ear: Hearing normal.     Left Ear: Hearing normal.     Nose: Nose normal.  Eyes:     Conjunctiva/sclera: Conjunctivae normal.     Pupils: Pupils are equal, round, and reactive to light.  Neck:     Musculoskeletal: Normal range of motion and neck supple.  Cardiovascular:     Rate and Rhythm: Regular rhythm.     Heart sounds: S1 normal and S2 normal. No murmur. No friction rub. No gallop.  Pulmonary:     Effort: Pulmonary effort is normal. No respiratory distress.     Breath sounds: Normal breath sounds.  Chest:     Chest wall: No tenderness.  Abdominal:     General: Bowel sounds are normal.     Palpations: Abdomen is soft.     Tenderness: There is no abdominal tenderness. There is no guarding or rebound. Negative signs include [name]'s sign and McBurney's sign.     Hernia: No hernia is present.  Musculoskeletal: Normal range of motion.  Skin:    General: Skin is warm and dry.     Findings: No rash.     Comments: Significant thickening of the skin in both axilla with excoriations and ulcerations without drainage or induration.  Some areas are scabbed over.  Neurological:     Mental Status: She is alert and oriented to person, place, and time.     GCS: GCS eye subscore is 4. GCS verbal subscore is 5. GCS motor subscore is 6.     Cranial Nerves: No cranial nerve deficit.     Sensory: No sensory deficit.     Coordination: Coordination normal.  Psychiatric:        Speech: Speech normal.        Behavior: Behavior normal.        Thought Content: Thought content normal.      ED Treatments / Results  Labs (all labs ordered are listed, but only abnormal results are displayed) Labs  Reviewed  CBC WITH DIFFERENTIAL/PLATELET - Abnormal; Notable for the following components:      Result Value   RBC 3.49 (*)    Hemoglobin 10.5 (*)    HCT 31.1 (*)    RDW 15.8 (*)    All other components within normal limits  COMPREHENSIVE METABOLIC PANEL - Abnormal; Notable for the following components:   Potassium 3.1 (*)    Glucose, Bld 112 (*)    All other components within normal limits    EKG None  Radiology No results found.  Procedures Procedures (including critical care time)  Medications Ordered in ED Medications  lidocaine (XYLOCAINE) 2 % jelly 1 application (1 application Topical Given 12/05/18 0223)     Initial Impression / Assessment and Plan / ED Course  I have reviewed the triage vital signs and the nursing notes.  Pertinent labs & imaging results that were available during my care of the patient were reviewed by me and considered in my medical decision making (see chart for details).        Patient presents to the emergency department for evaluation of painful rash in both of her axilla.  Etiology is unclear at this time.  Rash is very symmetric.  It is only located in her axilla bilaterally.  There is skin thickening and what appears to be chronic changes, possibly tinea in origin.  There is also, however some erythema and excoriations.  There are no vesicles.  Reviewing her records does reveal history of vasculitis, likely secondary to her drug use.  With the very discrete distribution, symmetrically bilaterally, doubt vasculitis.  Patient was complaining of significant pain at arrival which seemed to improve with topical lidocaine.  Will treat empirically for tinea skin infection with bacterial superinfection.  Final Clinical Impressions(s) / ED Diagnoses   Final diagnoses:  Tinea  Cellulitis of axilla, unspecified laterality    ED Discharge Orders    None       Orpah Greek, MD 12/05/18 [phone]

## 2018-12-06 IMAGING — DX DG TIBIA/FIBULA 2V*L*
2 series · 2 of 2 positions shown · non-contrast
Comparison: Left ankle radiographs performed 09/09/2016

CLINICAL DATA: Delayed wound healing at the left lower leg. Initial
encounter.

EXAM:
LEFT TIBIA AND FIBULA - 2 VIEW

[tibia ap]
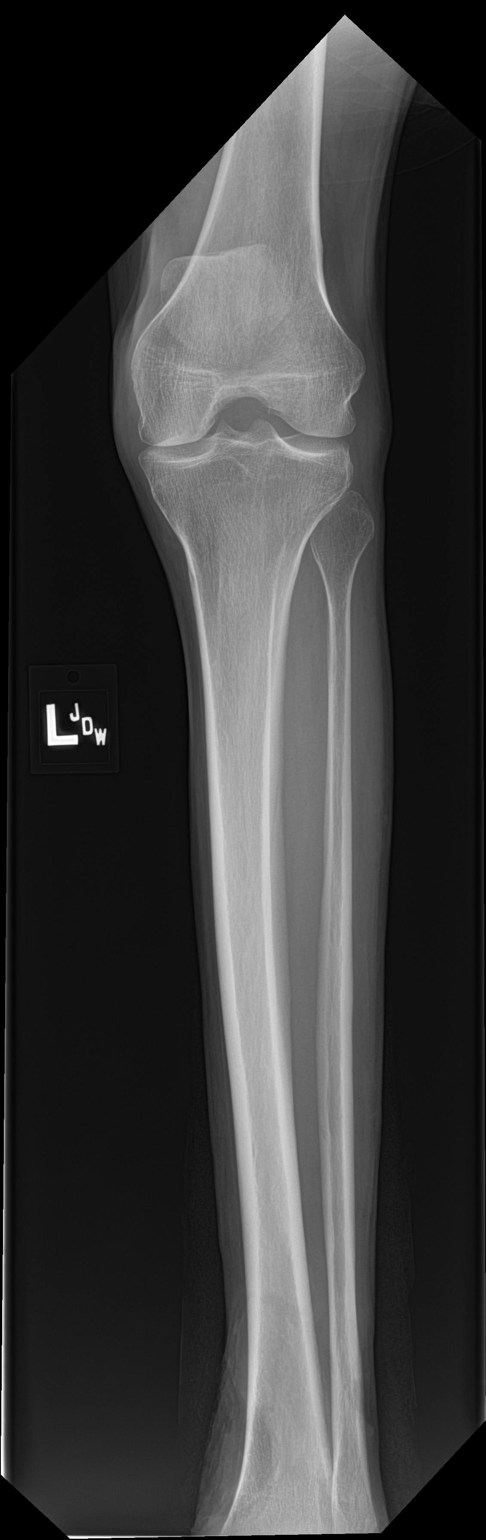

[tibia lat]
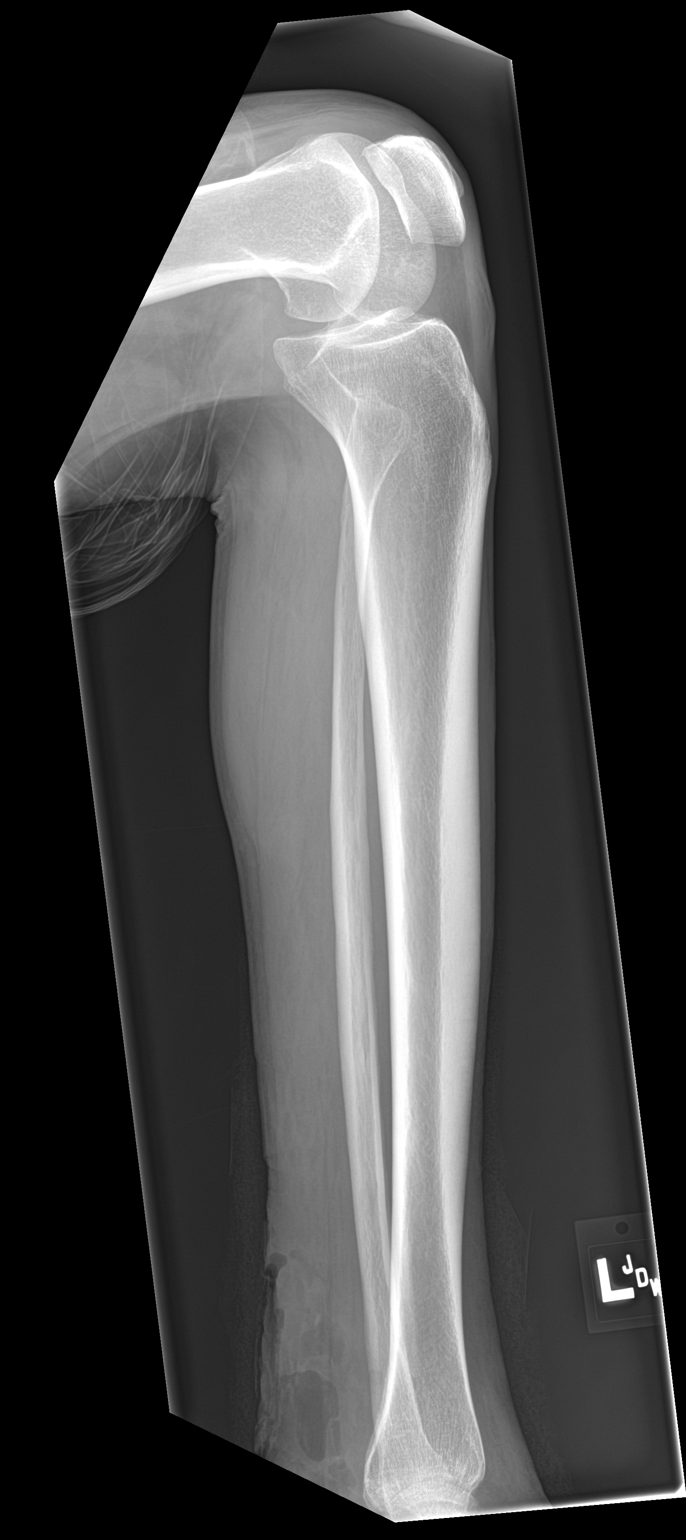

[2 of 2 positions shown; findings below may reference images not displayed]

FINDINGS: The left lower leg wound appears to have increased in size. No
osseous erosions are seen. There is no evidence of fracture or
dislocation. The knee joint is grossly unremarkable. No knee joint
effusion is seen.
IMPRESSION: No osseous erosions seen. Left lower leg wound appears to have
increased in size.

## 2018-12-06 IMAGING — DX DG TIBIA/FIBULA 2V*R*
2 series · 2 of 2 positions shown · non-contrast
Comparison: Right tibia/fibula radiographs performed 09/09/2016

CLINICAL DATA: Delayed wound healing at the right lower leg. Large
ulcerations, with severe pain. Subsequent encounter.

EXAM:
RIGHT TIBIA AND FIBULA - 2 VIEW

[tibia ap]
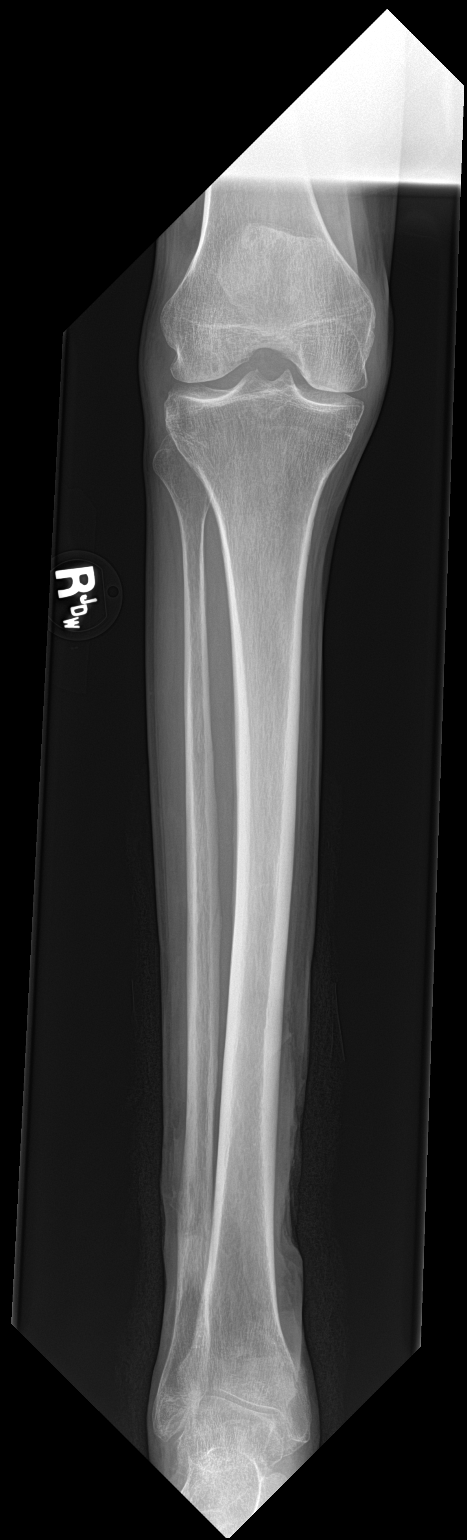

[tibia lat]
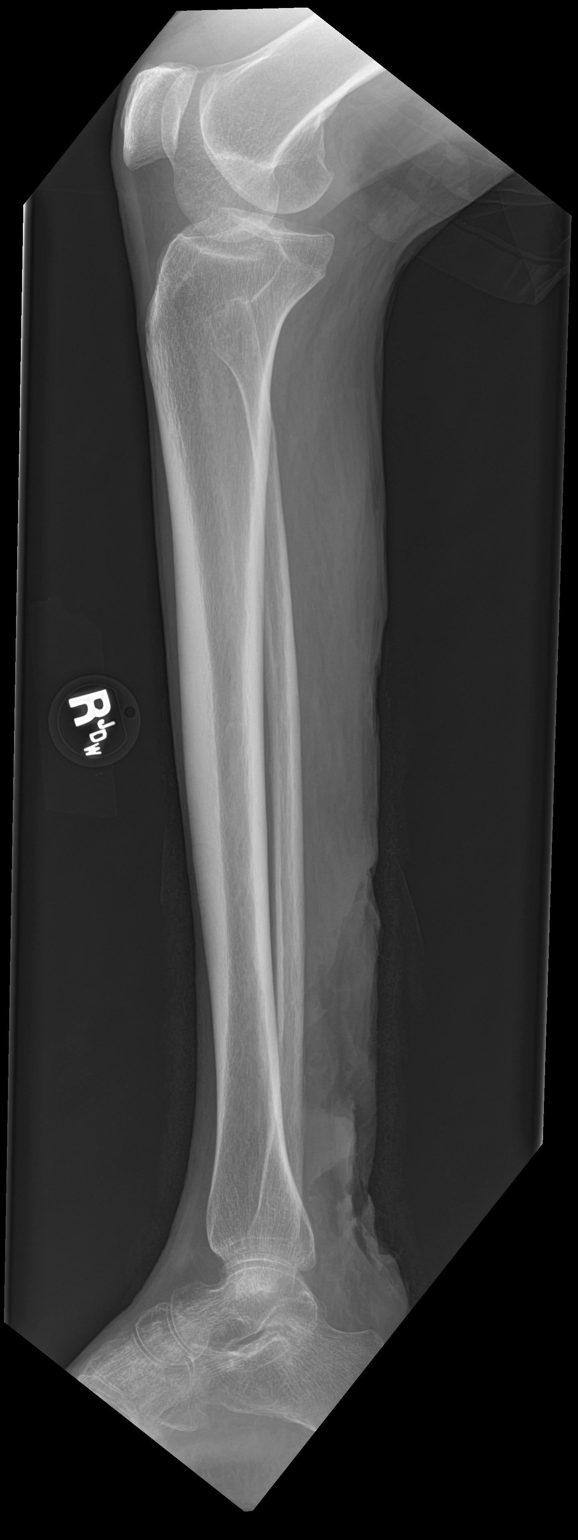

[2 of 2 positions shown; findings below may reference images not displayed]

FINDINGS: An enlarging soft tissue defect is noted involving the right ankle
and lower leg. No definite osseous erosions are seen at this time.

The tibia and fibula appear grossly intact. The knee joint is
grossly unremarkable.
IMPRESSION: No definite osseous erosions. Enlarging soft tissue defect involving
the right ankle and lower leg.

## 2018-12-06 IMAGING — DX DG ANKLE COMPLETE 3+V*L*
3 series · 3 of 3 positions shown · non-contrast
Comparison: Left ankle radiographs performed 09/09/2016

CLINICAL DATA: Delayed healing of left lower leg wound. Initial
encounter.

EXAM:
LEFT ANKLE COMPLETE - 3+ VIEW

[ankle ap]
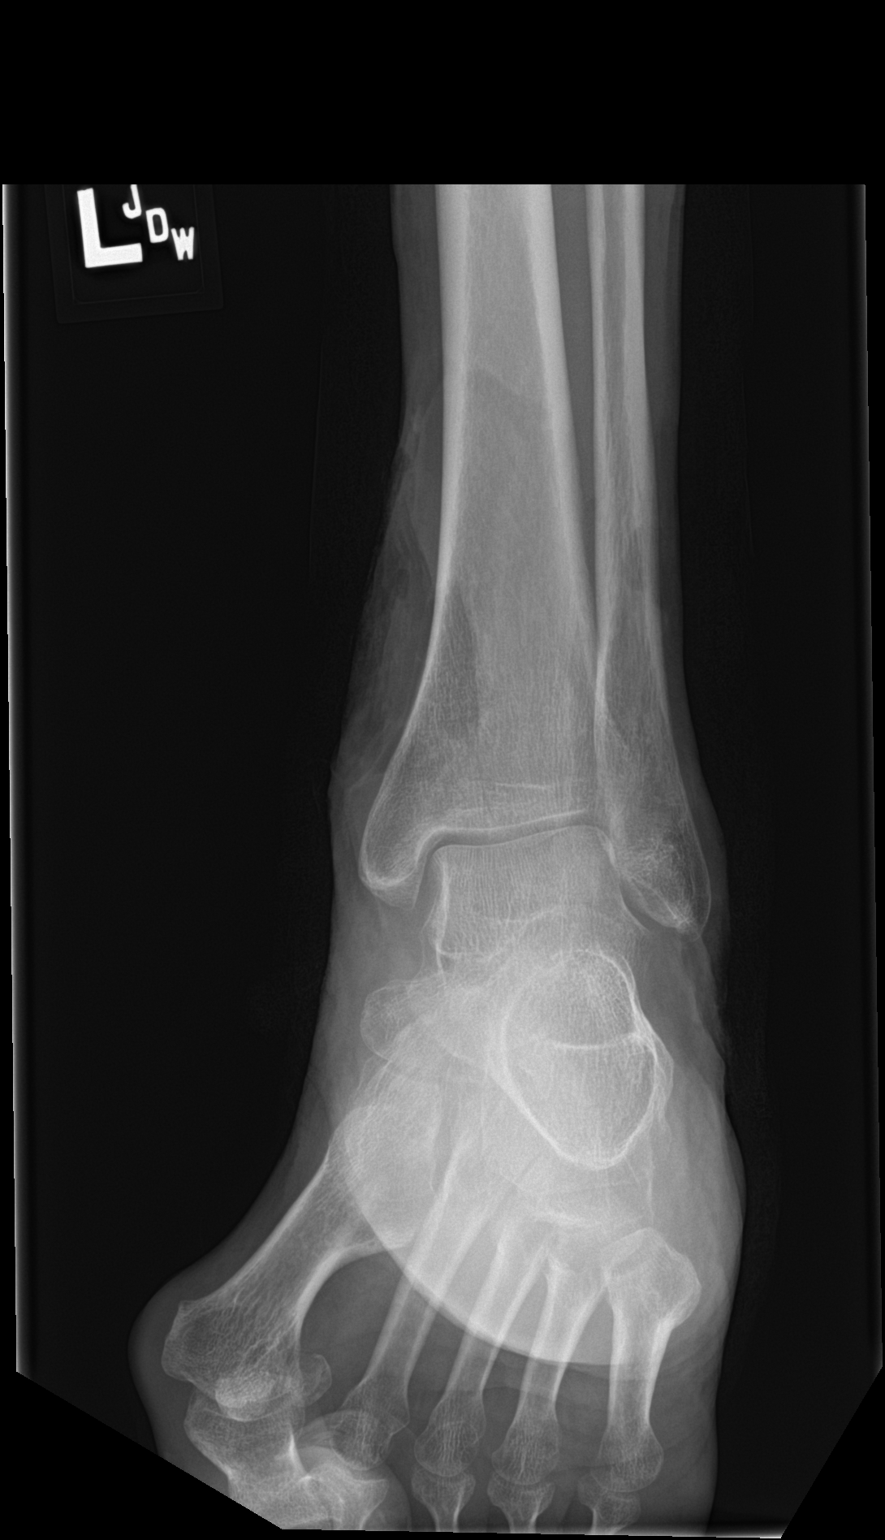

[ankle obl]
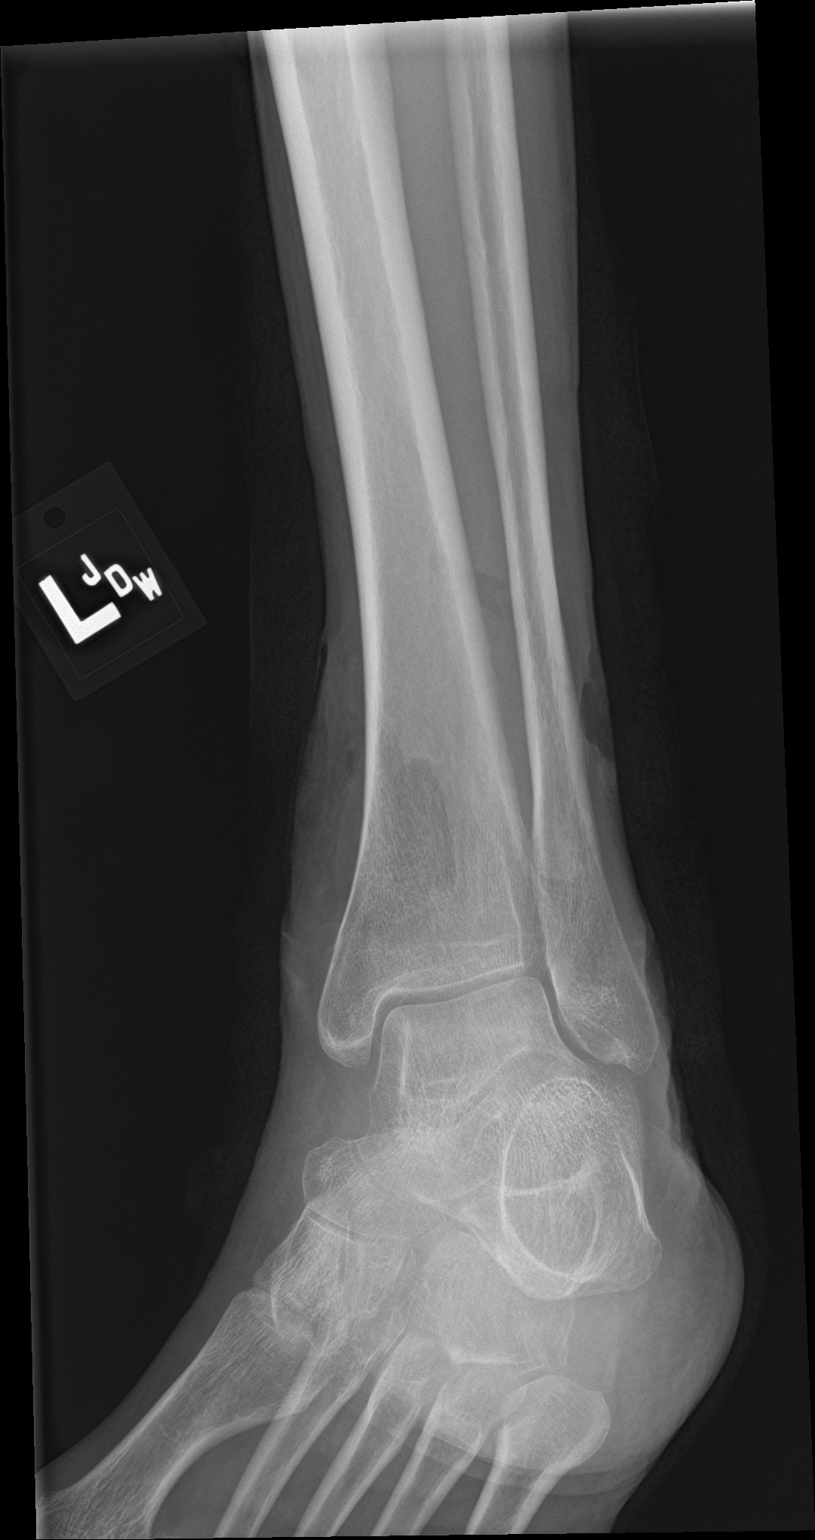

[ankle lat]
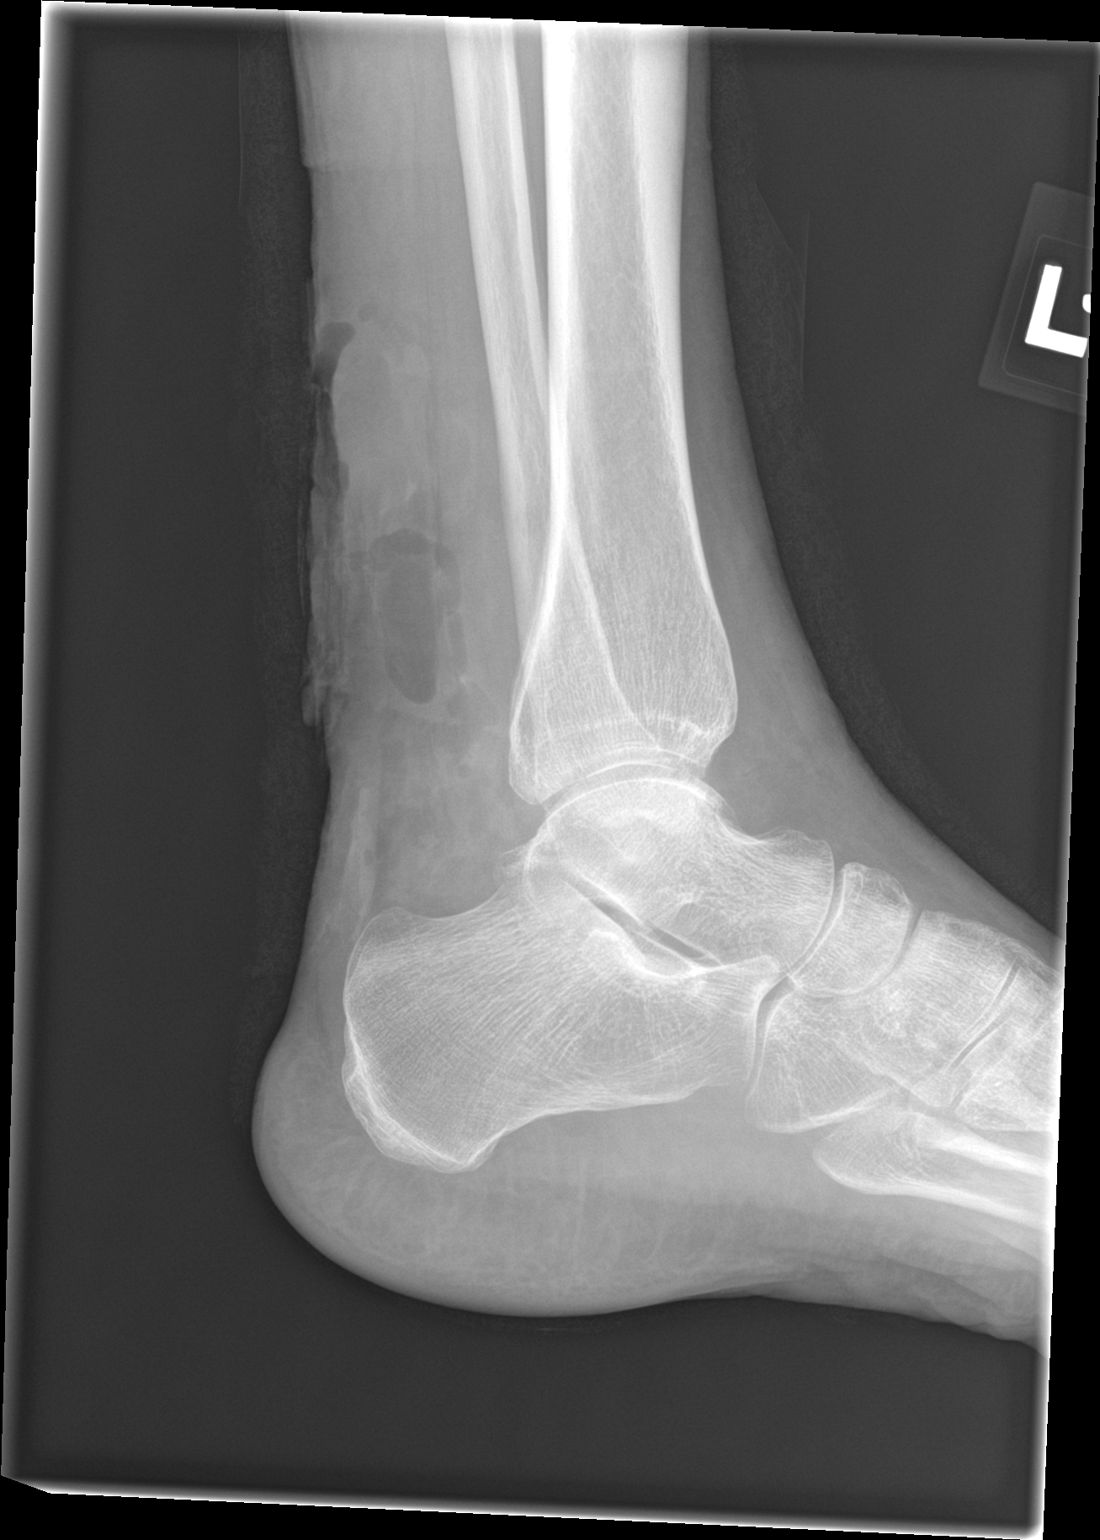

[3 of 3 positions shown; findings below may reference images not displayed]

FINDINGS: A persistent large wound is noted at the medial aspect of the left
ankle; it now appears to extend to the lateral and posterior aspects
of the ankle. Edema tracks about Kager's fat pad.

There is no evidence of osseous erosion. The ankle mortise is
unremarkable in appearance. The subtalar joint is grossly
unremarkable.
IMPRESSION: Persistent large wound at the medial aspect of the left ankle, now
extending to the lateral and posterior aspects of the ankle. Edema
tracking about Kager's fat pad. No osseous erosions seen.

## 2018-12-08 IMAGING — MR MR [PERSON_NAME] LOW WO/W CM*L*
4 of 13 series · 18 of 40 positions shown · IV contrast (multihance)
Comparison: None.

CLINICAL DATA: She continues to use cocaine which is unfortunately
universally contaminated with levimasole and she has developed
consequential worsening ulceration and necrosis of soft tissues in
her legs and toes to the point where there may be bone exposed now.
On exam this morning we came to see her she was extremely tearful
and crying and upset and in pain. She states that the ulcers are
incredibly painful. I again explained to her that her cocaine use
was driving the pathology.

EXAM:
MRI OF LOWER RIGHT EXTREMITY WITHOUT AND WITH CONTRAST
MRI OF LOWER LEFT EXTREMITY WITHOUT AND WITH CONTRAST
TECHNIQUE: Multiplanar, multisequence MR imaging of the right lower leg was
performed both before and after administration of intravenous
contrast.
Multiplanar, multisequence MR imaging of the left lower leg was
CONTRAST:  8mL MULTIHANCE GADOBENATE DIMEGLUMINE 529 MG/ML IV SOLN

[Series 5: T1 · axial · 7.0mm · 0.68mm/px · z∈[-314,+50]mm · 5 of 42 slices shown (1 of 2)]
[im 1/42]
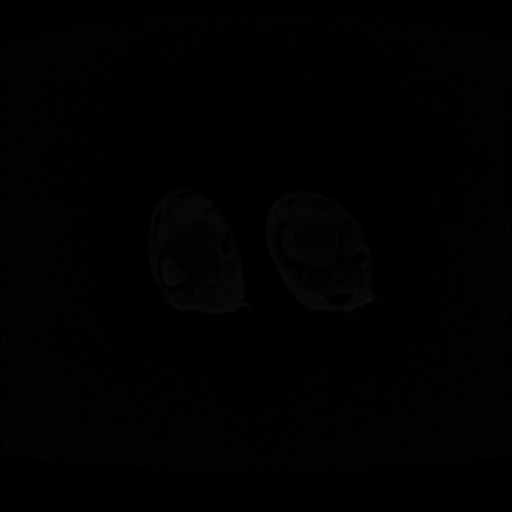
[im 11/42]
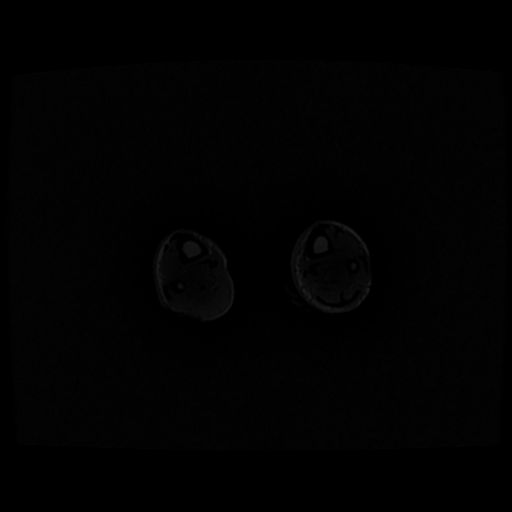
[im 21/42]
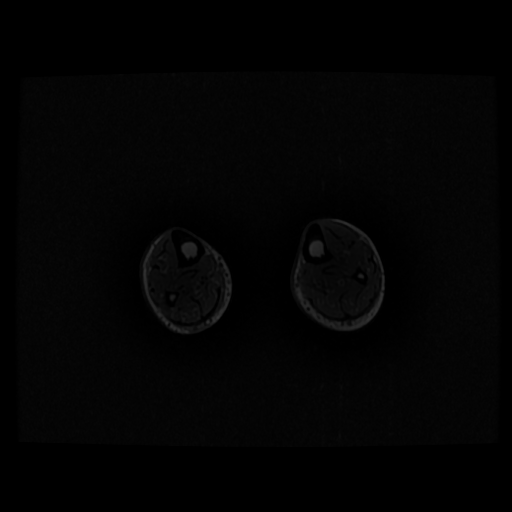
[im 31/42]
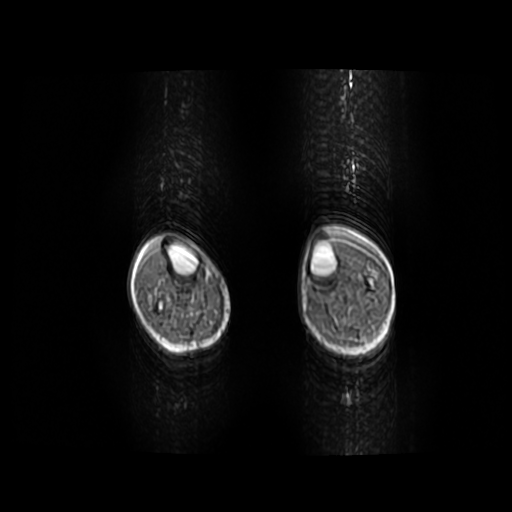
[im 42/42]
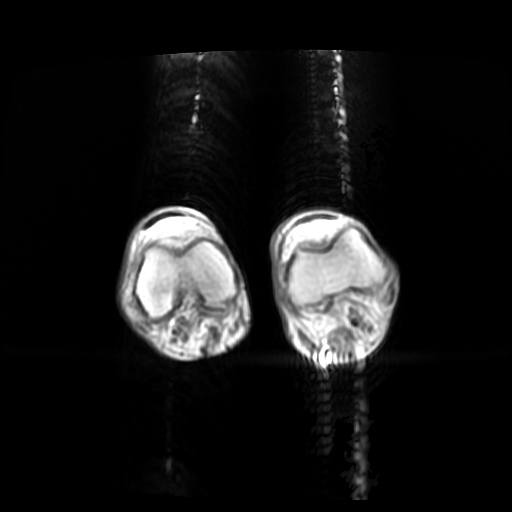

[Series 6: T1 fat-sat · axial · non-contrast · 7.0mm · 0.68mm/px · z∈[-314,+50]mm · 5 of 42 slices shown]
[im 1/42]
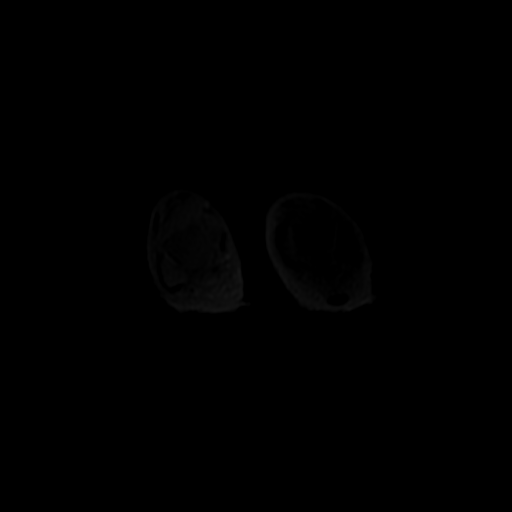
[im 11/42]
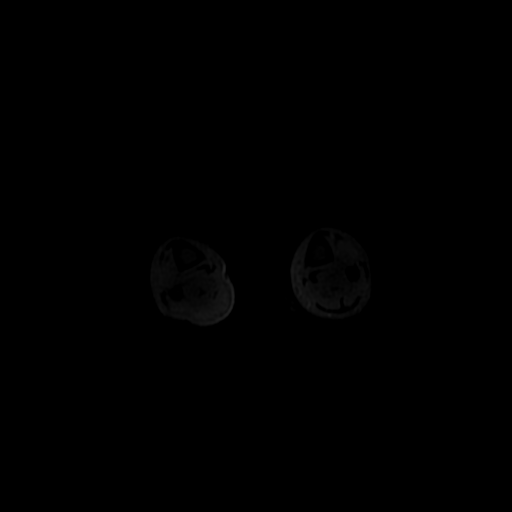
[im 21/42]
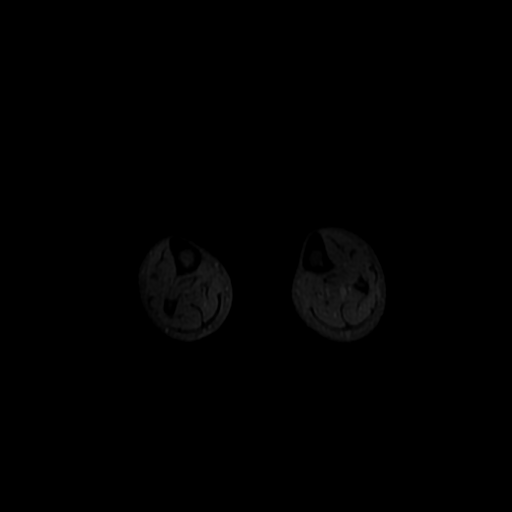
[im 31/42]
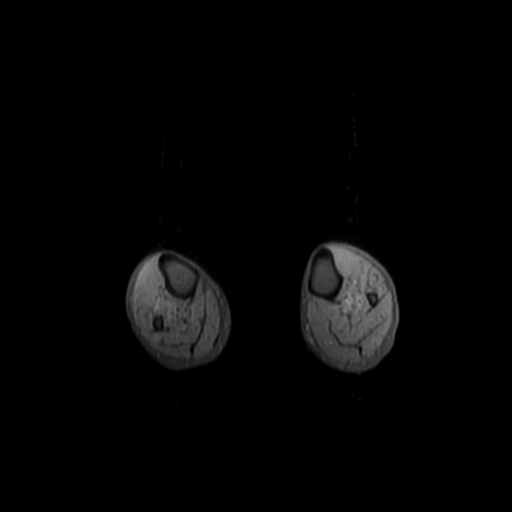
[im 42/42]
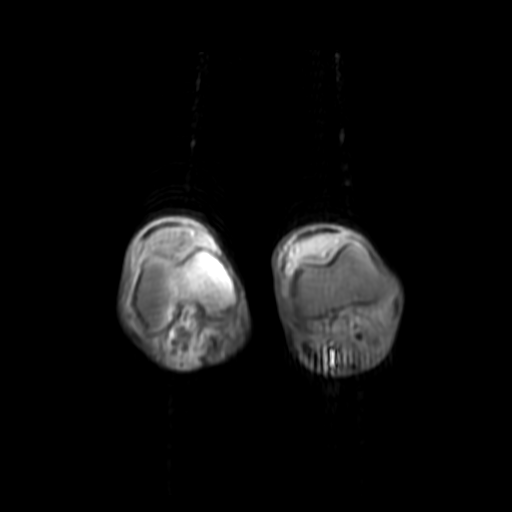

[Series 7: T1 · coronal · 5.0mm · 0.80mm/px · 3 of 21 slices shown (2 of 2)]
[im 1/21]
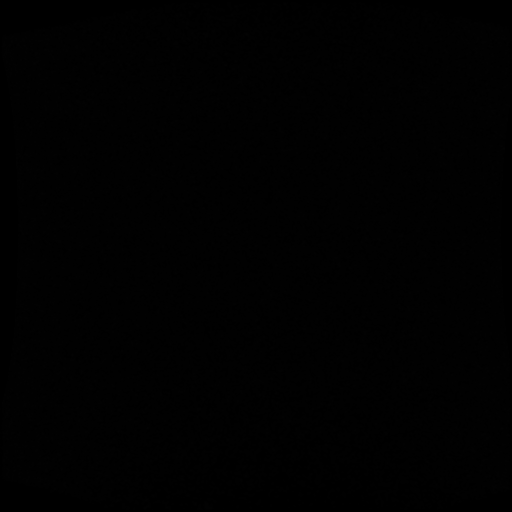
[im 11/21]
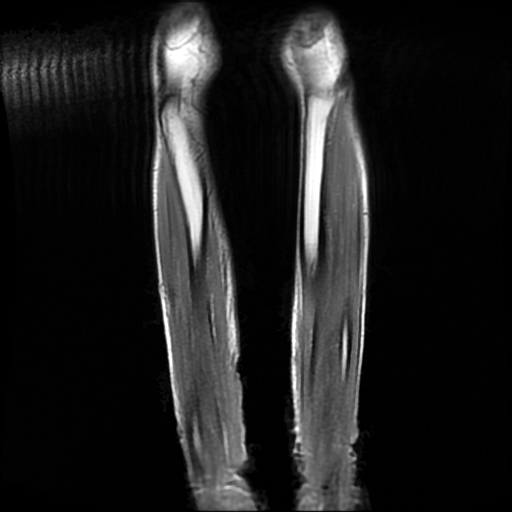
[im 21/21]
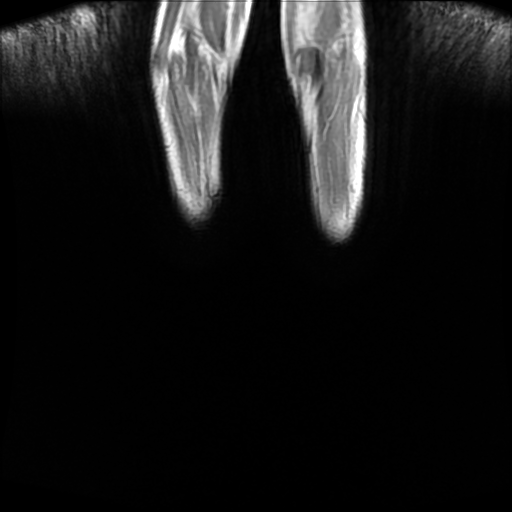

[Series 8: T2 fat-sat · axial · 7.0mm · 0.68mm/px · z∈[-314,+50]mm · 5 of 42 slices shown]
[im 1/42]
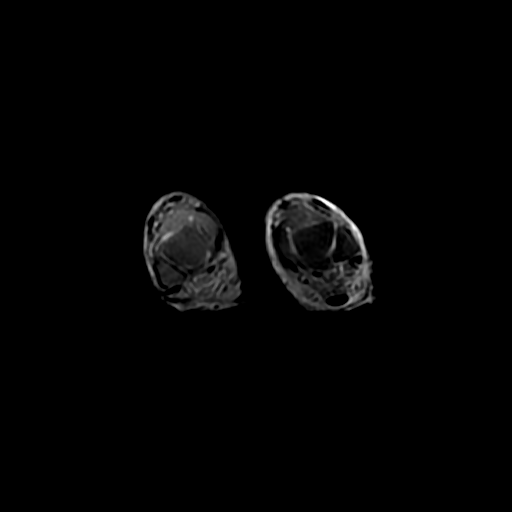
[im 11/42]
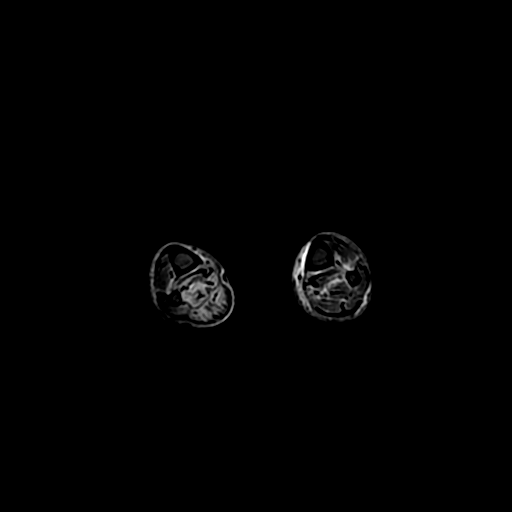
[im 21/42]
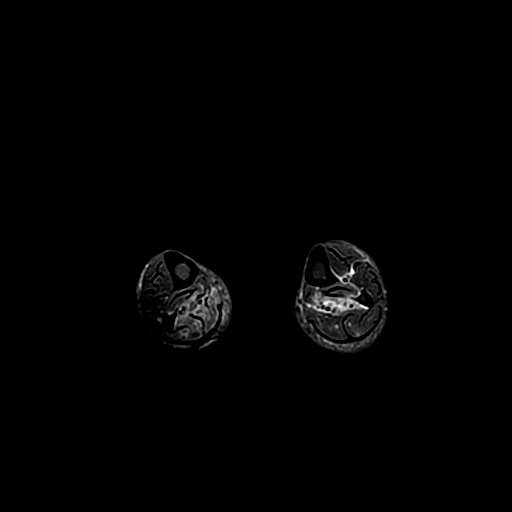
[im 31/42]
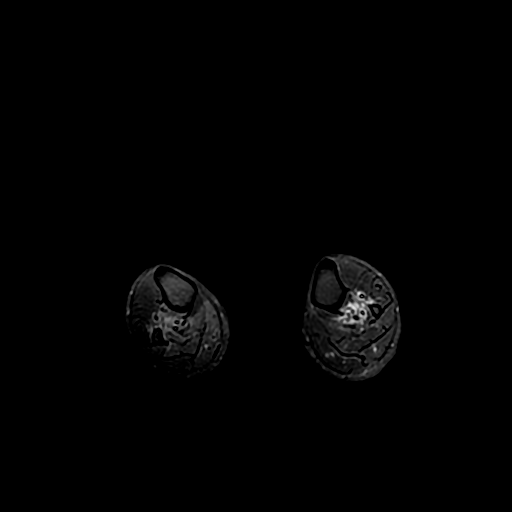
[im 42/42]
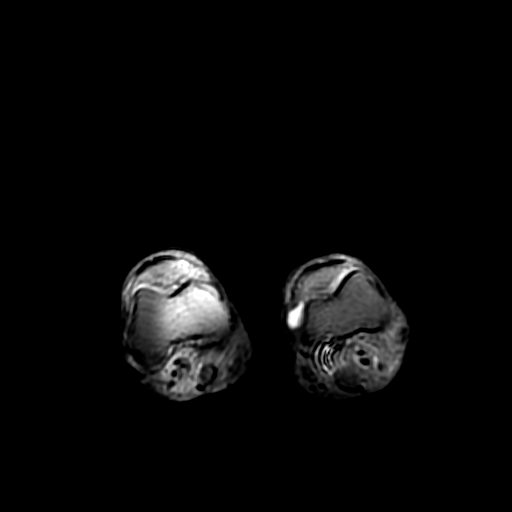

[18 of 40 positions shown; findings below may reference images not displayed]

FINDINGS: Bones/Joint/Cartilage

No focal marrow signal abnormality of bilateral tibia and fibula. No
fracture or dislocation. Normal alignment. No joint effusion. No
aggressive osseous lesion. No periosteal reaction or bone
destruction.

Ligaments

Collateral ligaments are intact.

Muscles and Tendons
Muscle edema in the right distal soleus muscle with mild
enhancement. Muscle edema in the right flexor hallucis longus
muscle.

Mild muscle edema in the left distal soleus muscle to lesser extent
than the right.

Proximal right Achilles tendon is severely attenuated and not
visualized and may reflect severe tendinosis versus complete tear.

Soft tissue
Cutaneous ulcers involving the posterior distal lower leg
bilaterally, right worse than left. No soft tissue mass.
IMPRESSION: 1. Cutaneous ulcers involving the posterior distal lower leg
bilaterally, right worse than left.
2. Muscle edema in the right distal soleus muscle with mild
enhancement. Muscle edema in the right flexor hallucis longus
muscle. Mild muscle edema in the left distal soleus muscle to lesser
extent than the right. Findings are most concerning for myositis
which may be secondary to an inflammatory or infectious etiology.
3. Proximal right Achilles tendon is severely attenuated and not
visualized and may reflect severe tendinosis versus complete tear.
4. No drainable fluid collection to suggest an abscess.
5. No osseous abnormality to suggest osteomyelitis.

## 2019-01-21 IMAGING — CR DG FINGER RING 2+V*R*
3 series · 3 of 3 positions shown · non-contrast
Comparison: None.

CLINICAL DATA: Soft tissue swelling and drainage.

EXAM:
RIGHT RING FINGER 2+V

[finger ap]
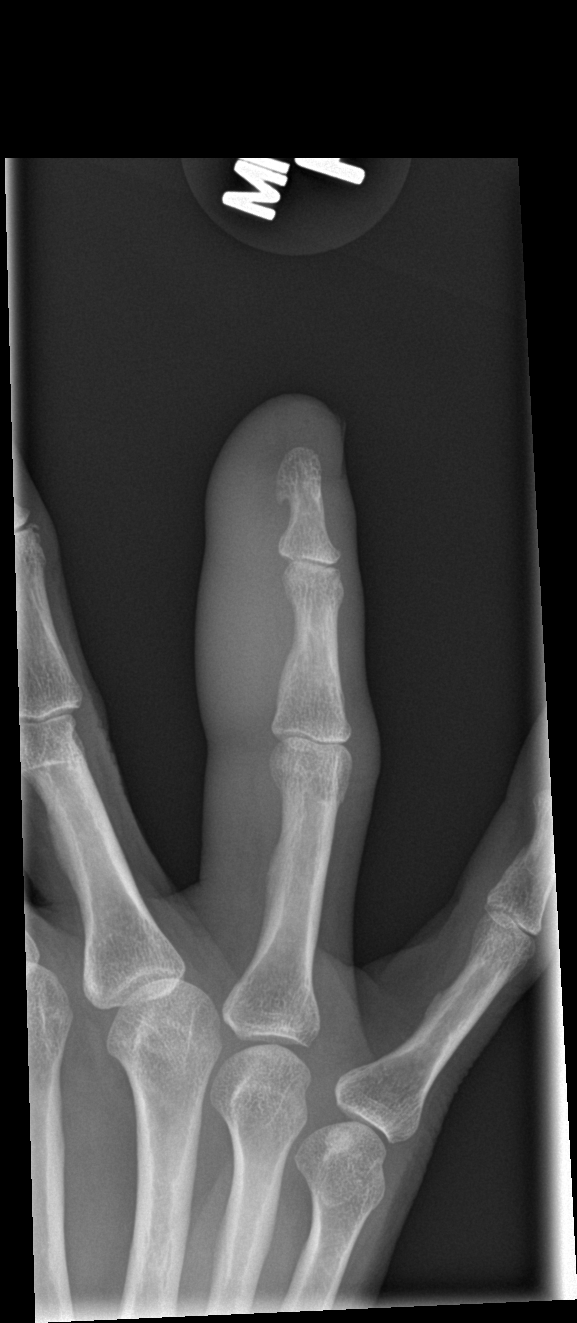

[finger obl]
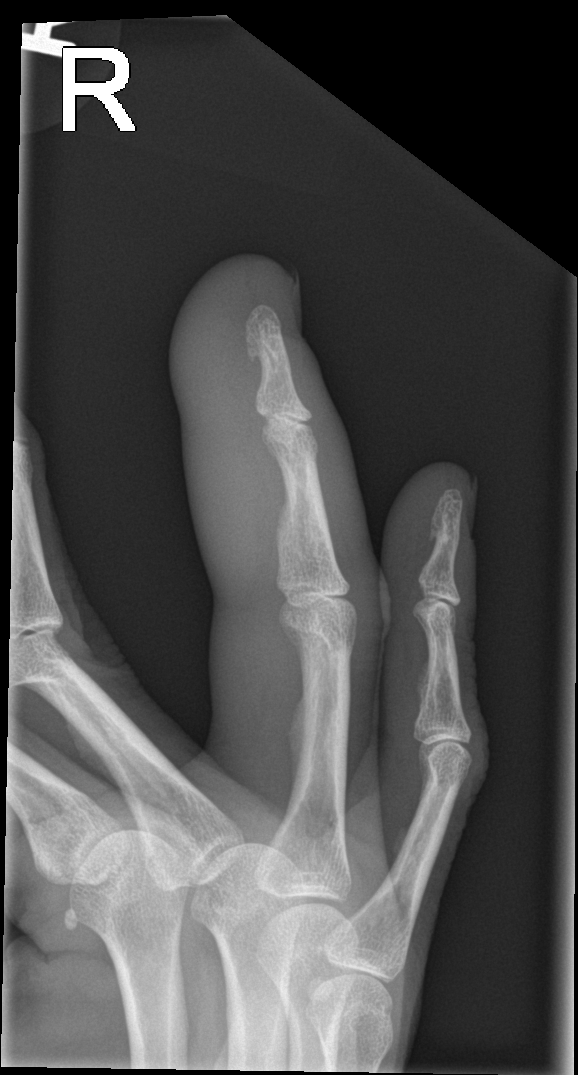

[finger lat]
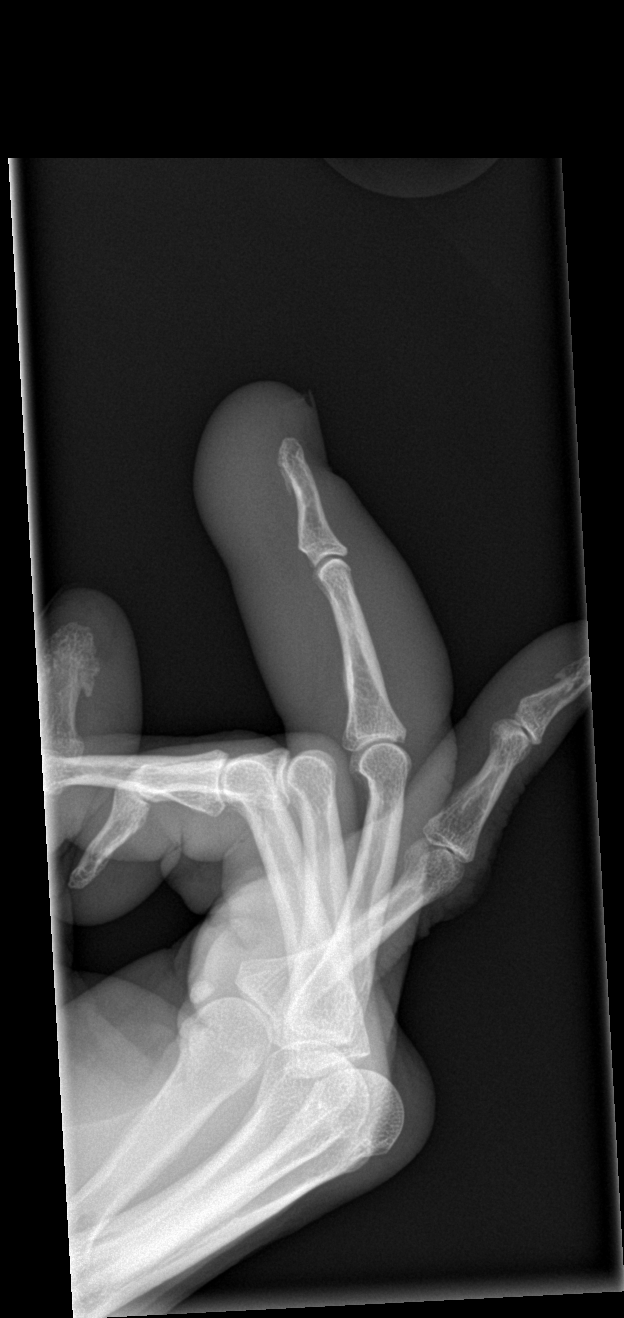

[3 of 3 positions shown; findings below may reference images not displayed]

FINDINGS: Marked soft tissue swelling of the fourth digit. No soft tissue gas.
No foreign body is evident. No bony destruction to confirm
osteomyelitis. Articulations are intact.
IMPRESSION: No radiographic findings to confirm osteomyelitis. MRI will be more
sensitive if additional imaging is clinically warranted.

## 2019-01-21 IMAGING — CR DG TIBIA/FIBULA 2V*R*
4 series · 4 of 4 positions shown · non-contrast
Comparison: None.

CLINICAL DATA: Skin breakdown and soft tissue ulcerations with
purulent drainage.

EXAM:
RIGHT TIBIA AND FIBULA - 2 VIEW

[tibia ap (1 of 2)]
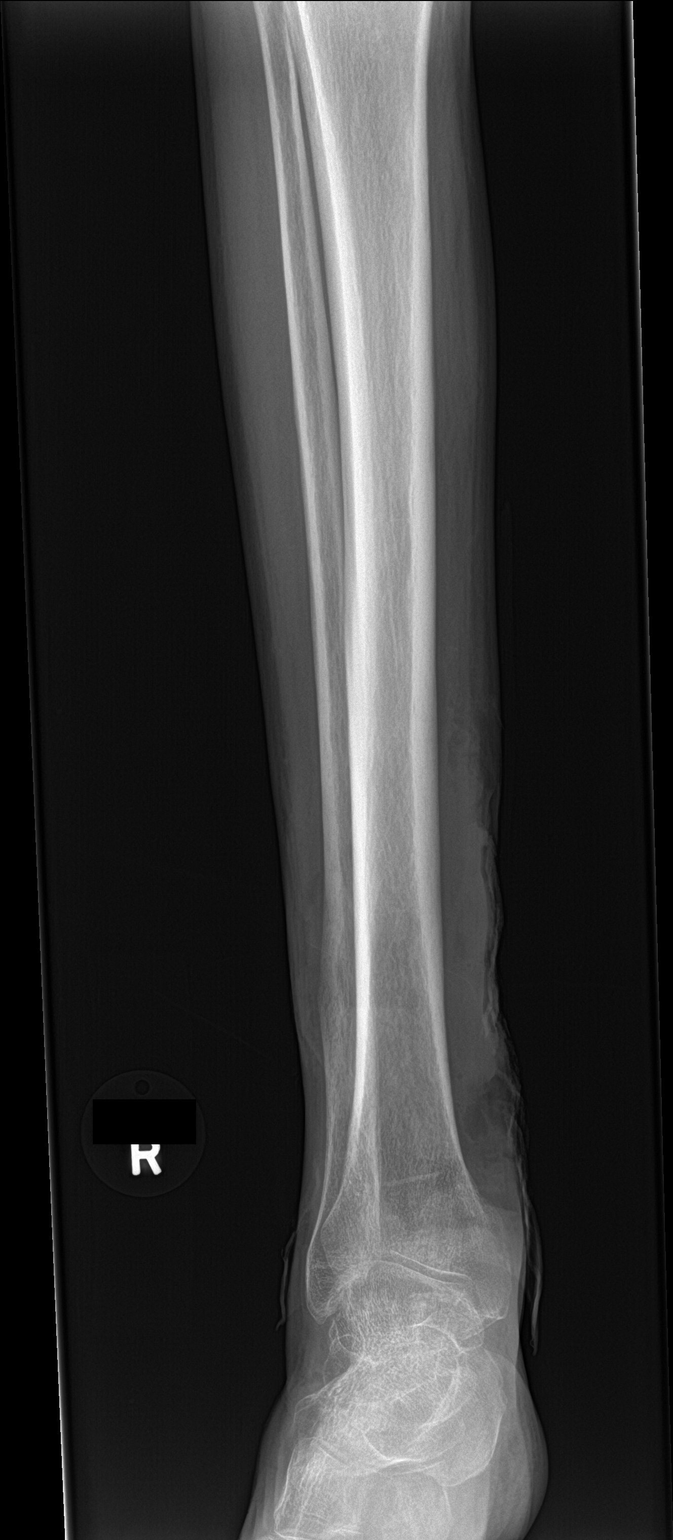

[tibia ap (2 of 2)]
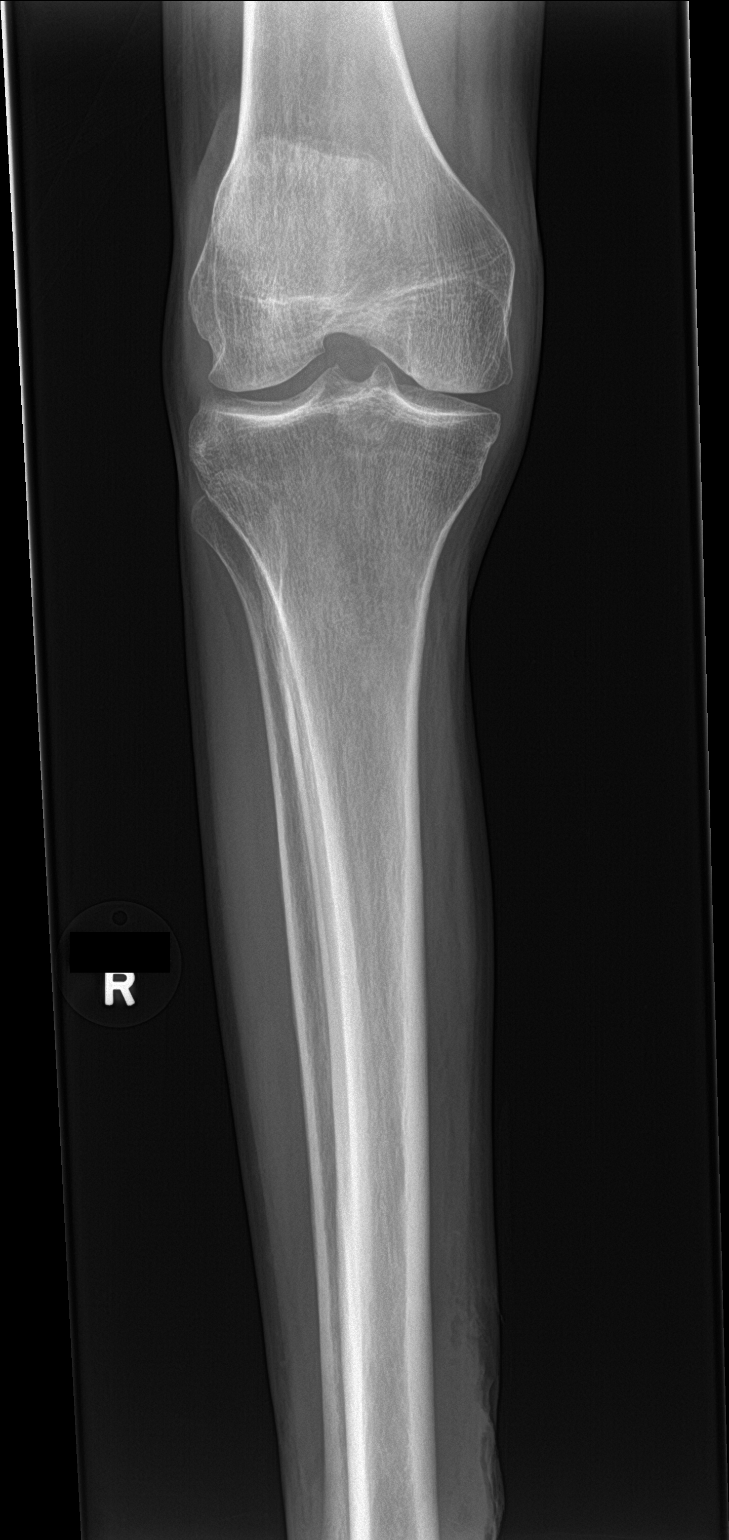

[tibia lat (1 of 2)]
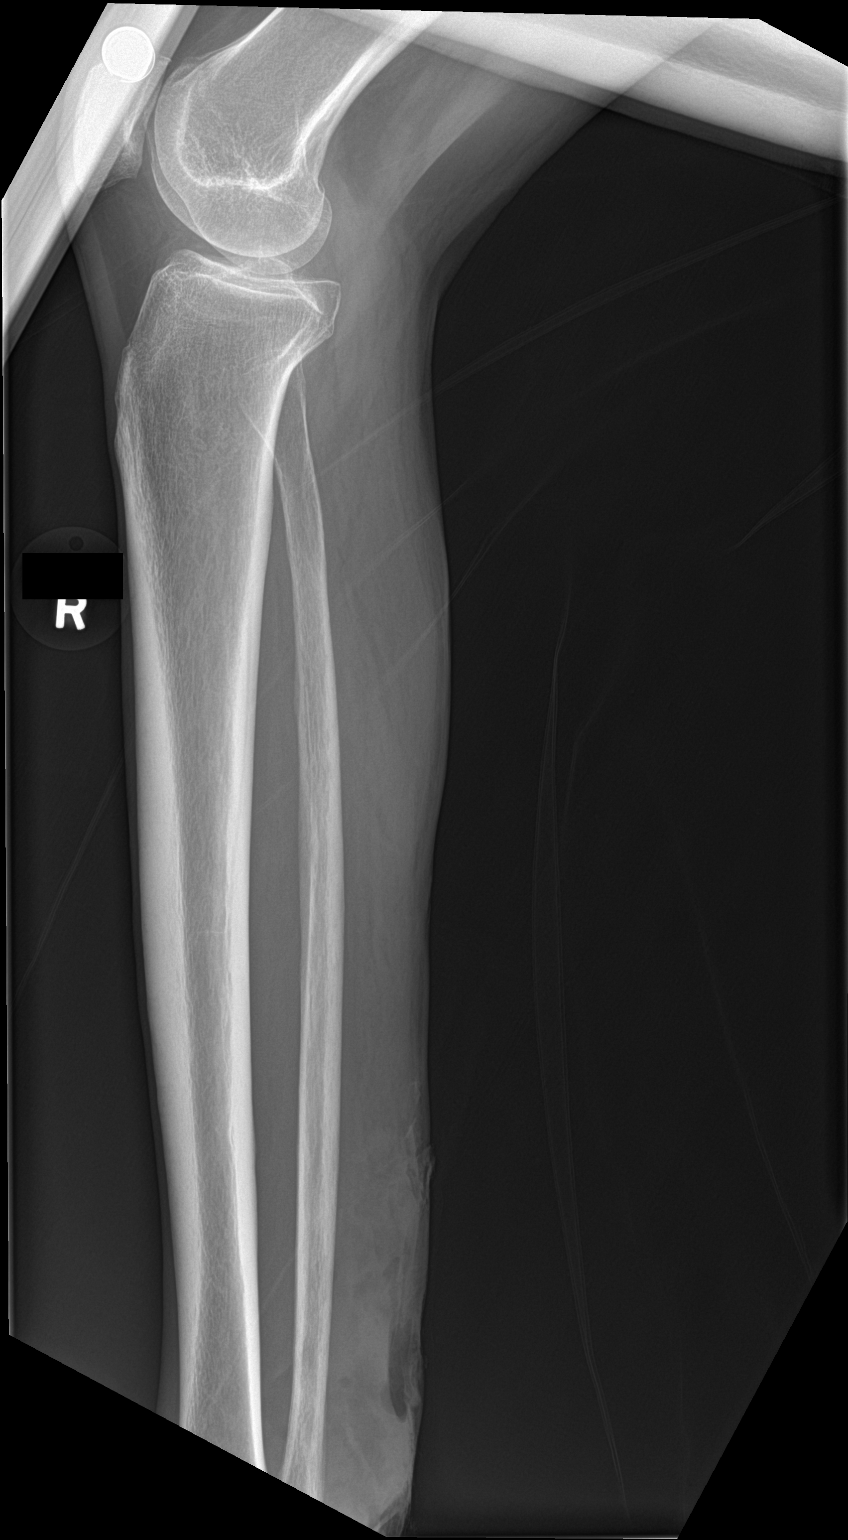

[tibia lat (2 of 2)]
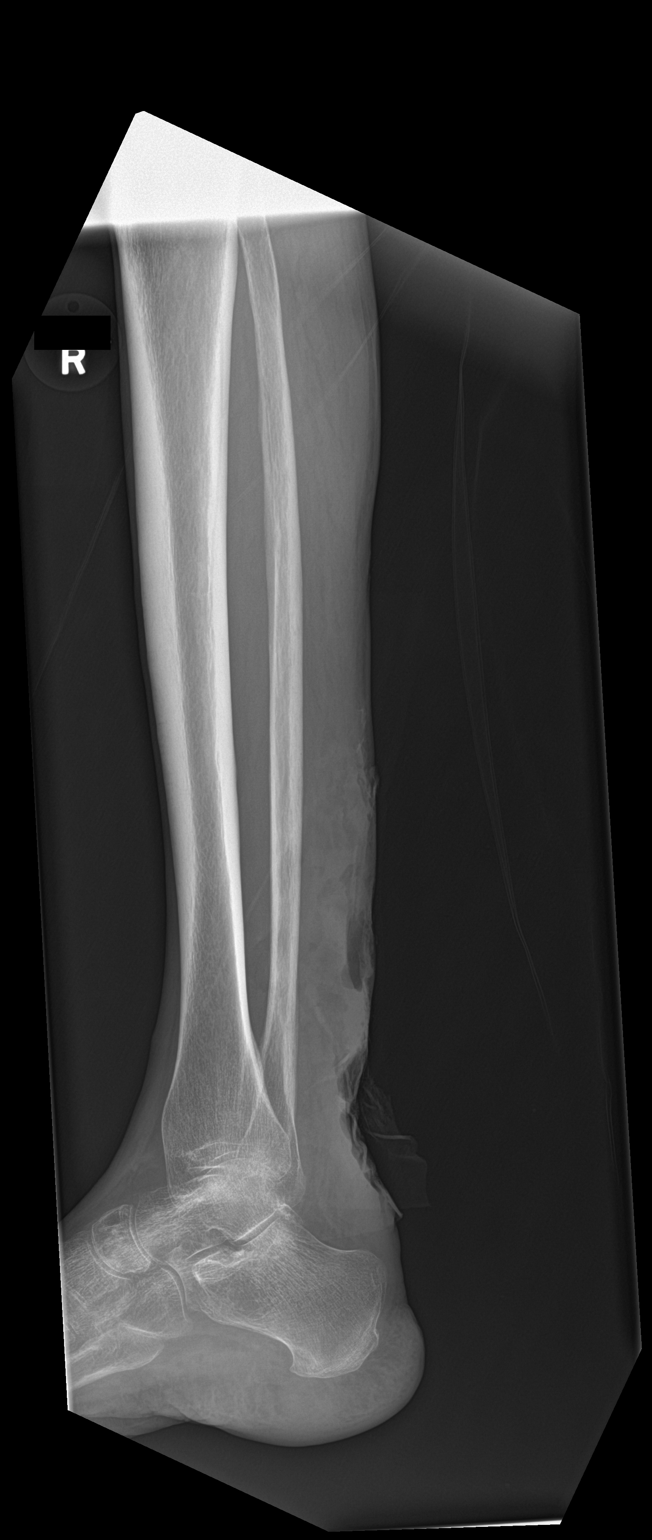

[4 of 4 positions shown; findings below may reference images not displayed]

FINDINGS: Large area of soft tissue ulceration in the posterior medial distal
leg. Bone loss at the medial malleolus is suspicious for
osteomyelitis. There is marked focal demineralization in this area
as well as loss of cortical definition.
IMPRESSION: Bone loss at the medial malleolus, suspicious for osteomyelitis.

## 2019-01-21 IMAGING — MR MR FOOT*R* W/O CM
6 of 12 series · 19 of 40 positions shown · non-contrast
Comparison: Right foot MRI 07/25/2016.

CLINICAL DATA: 55-year-old with levamisole-induced vasculitis,
poorly healing lower extremity wound and drainage. Evaluate for
osteomyelitis.

EXAM:
MRI OF THE RIGHT FOREFOOT WITHOUT CONTRAST
TECHNIQUE: Multiplanar, multisequence MR imaging of the right forefoot was
performed. No intravenous contrast was administered.

[Series 3: T2 fat-sat · coronal · 4.0mm · 0.27mm/px · 4 of 26 slices shown (1 of 5)]
[im 1/26]
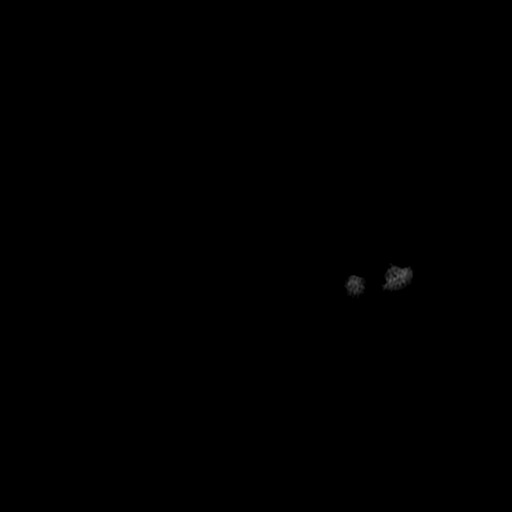
[im 9/26]
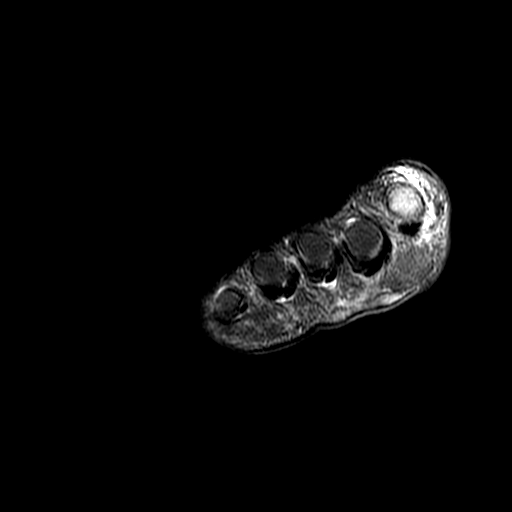
[im 17/26]
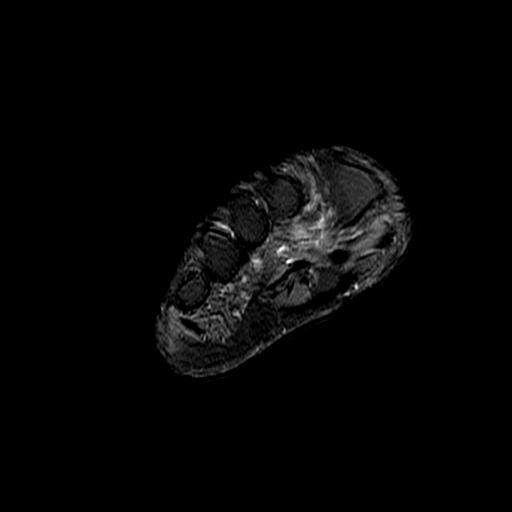
[im 26/26]
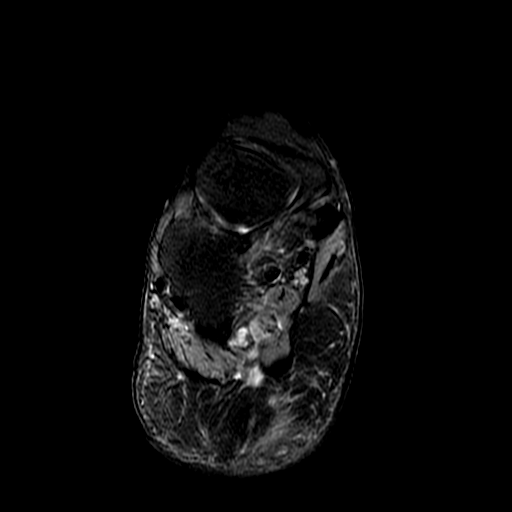

[Series 6: T2 fat-sat · oblique · 3.0mm · 0.33mm/px · 3 of 19 slices shown (2 of 5)]
[im 1/19]
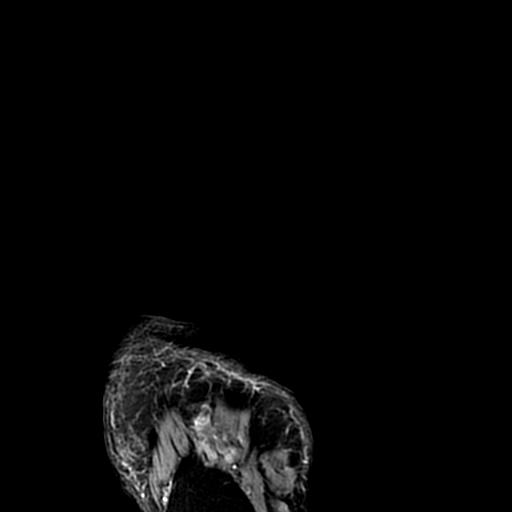
[im 10/19]
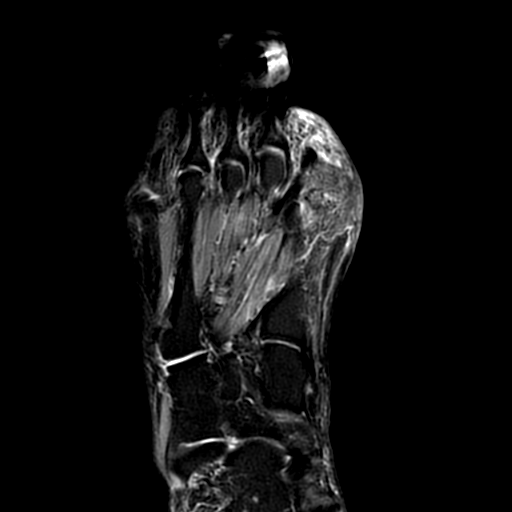
[im 19/19]
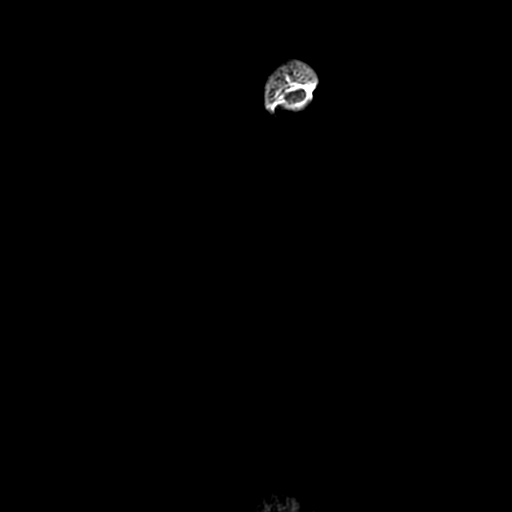

[Series 7: PD fat-sat · coronal · 4.0mm · 0.27mm/px · 3 of 26 slices shown]
[im 1/26]
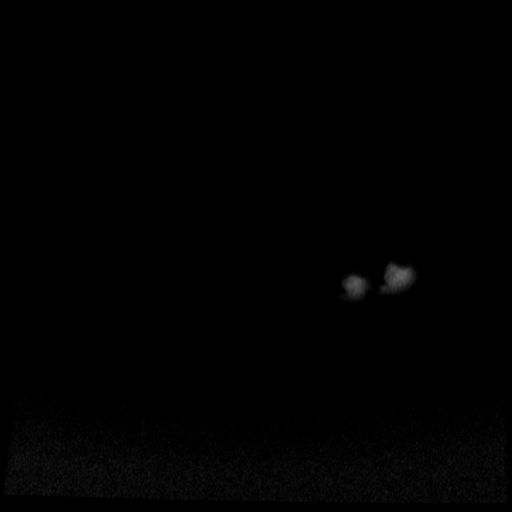
[im 13/26]
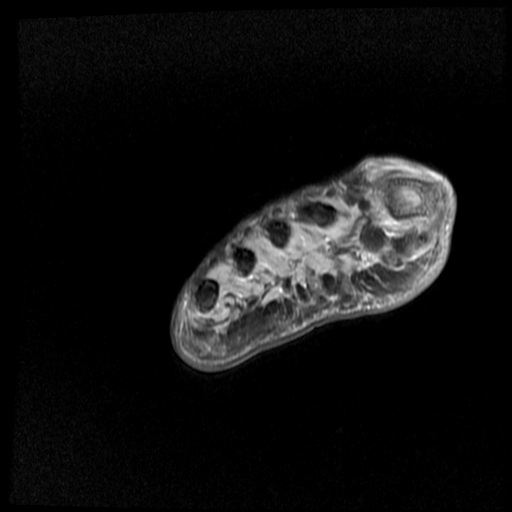
[im 26/26]
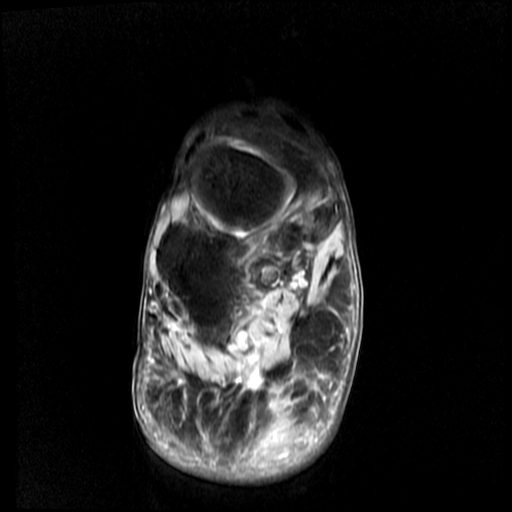

[Series 10: T2 fat-sat · axial · 4.0mm · 0.27mm/px · z∈[-26,+101]mm · 3 of 24 slices shown (3 of 5)]
[im 1/24]
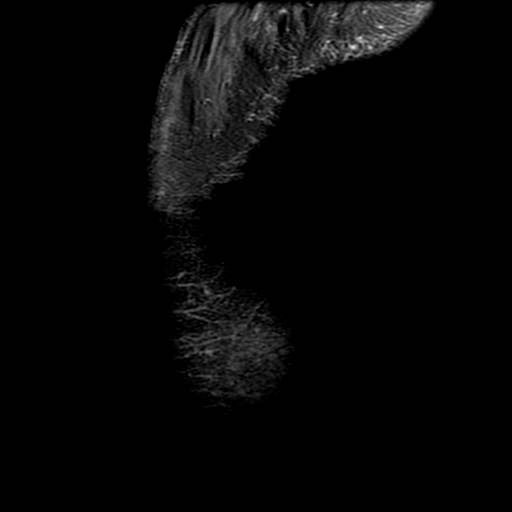
[im 12/24]
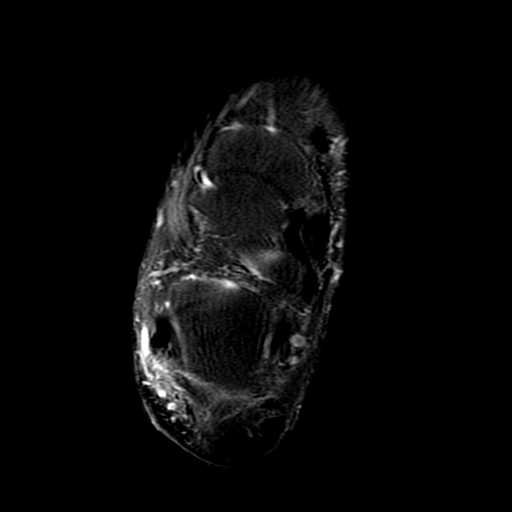
[im 24/24]
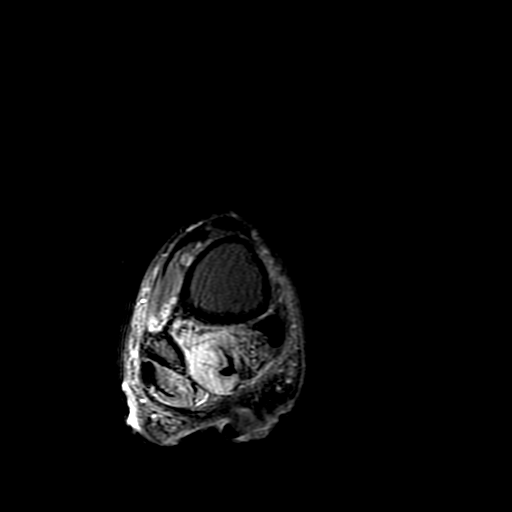

[Series 12: T2 fat-sat · coronal · 3.0mm · 0.29mm/px · 4 of 30 slices shown (4 of 5)]
[im 1/30]
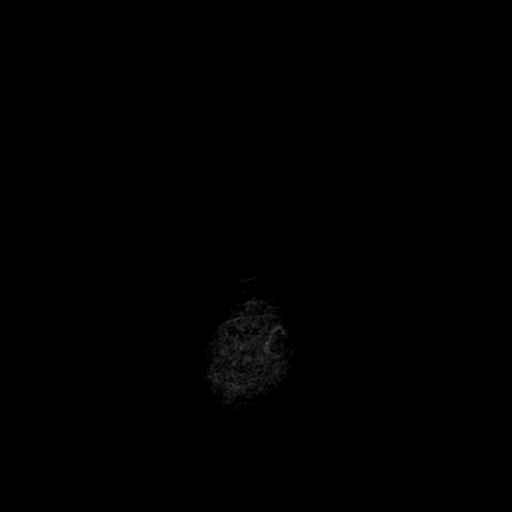
[im 10/30]
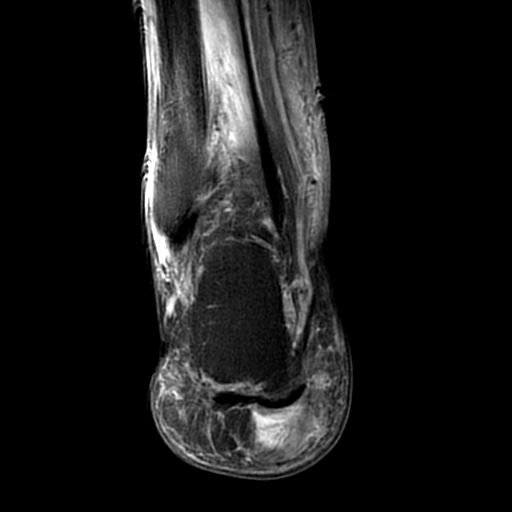
[im 20/30]
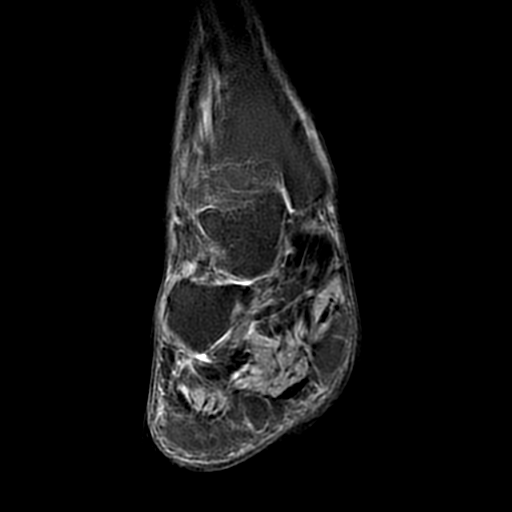
[im 30/30]
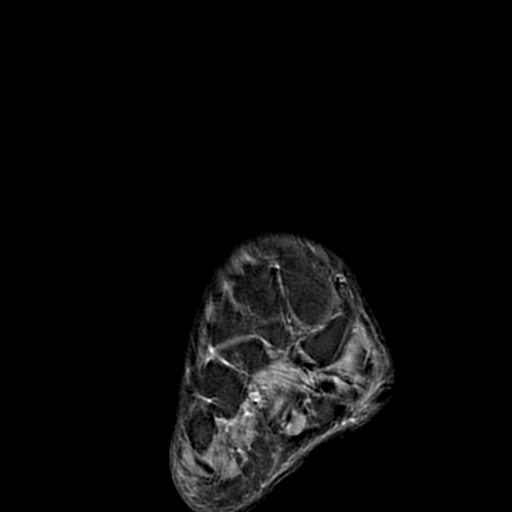

[Series 19: T2 fat-sat · sagittal · 3.0mm · 0.33mm/px · 2 of 26 slices shown (5 of 5)]
[im 1/26]
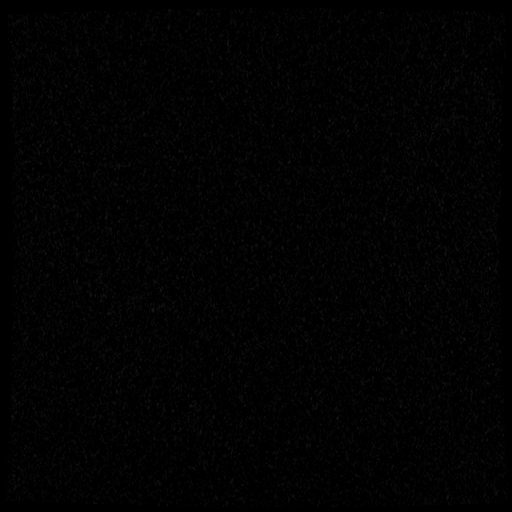
[im 13/26]
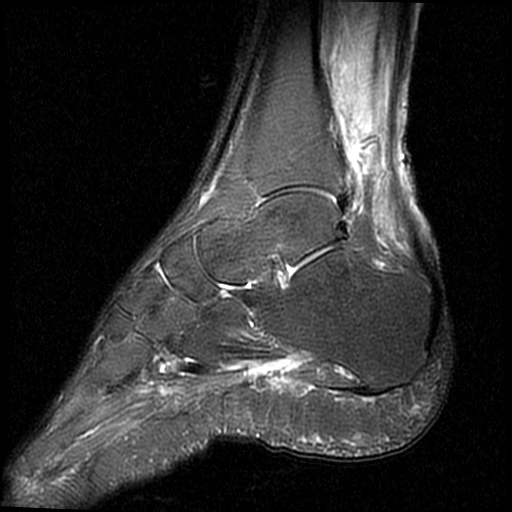

[19 of 40 positions shown; findings below may reference images not displayed]

FINDINGS: Distal lower leg and ankle findings are dictated separately.

Bones/Joint/Cartilage

Again demonstrated is severe arthropathy at the first metatarsal
phalangeal joint with a hallux valgus deformity, lateral subluxation
of the great toe and progressive erosions of the first metatarsal
head and medial proximal phalangeal base. These osseous findings
have mildly progressed compared with the previous MRI from 7 months
ago. No significant joint effusion.

The additional metatarsal phalangeal joints appear normal. The
alignment is normal at the Lisfranc joint. No other significant
osseous findings.

Ligaments

The Lisfranc ligament is intact.

Muscles and Tendons

The forefoot muscles and tendons appear intact.

Soft tissues

There are chronic inflammatory changes within the soft tissues
surrounding the first metatarsal phalangeal joint, similar to
previous study. No focal fluid collection or obvious draining tract
identified. Small pressure lesions in the plantar aspect of the
forefoot are similar to previous study.
IMPRESSION: 1. Progressive advanced arthropathy at the first metatarsal
phalangeal joint compared with previous study from 7 months ago.
These findings are nonspecific. Chronic osteomyelitis cannot be
completely excluded.
2. No obvious sinus tract or soft tissue abscess.
3. No other significant findings. Ankle findings are dictated
separately.

## 2019-01-21 IMAGING — CR DG TIBIA/FIBULA 2V*L*
4 series · 4 of 4 positions shown · non-contrast
Comparison: 12/24/2016, 12/22/2016.

CLINICAL DATA: Skin breakdown and purulent discharge.

EXAM:
LEFT TIBIA AND FIBULA - 2 VIEW

[tibia ap (1 of 2)]
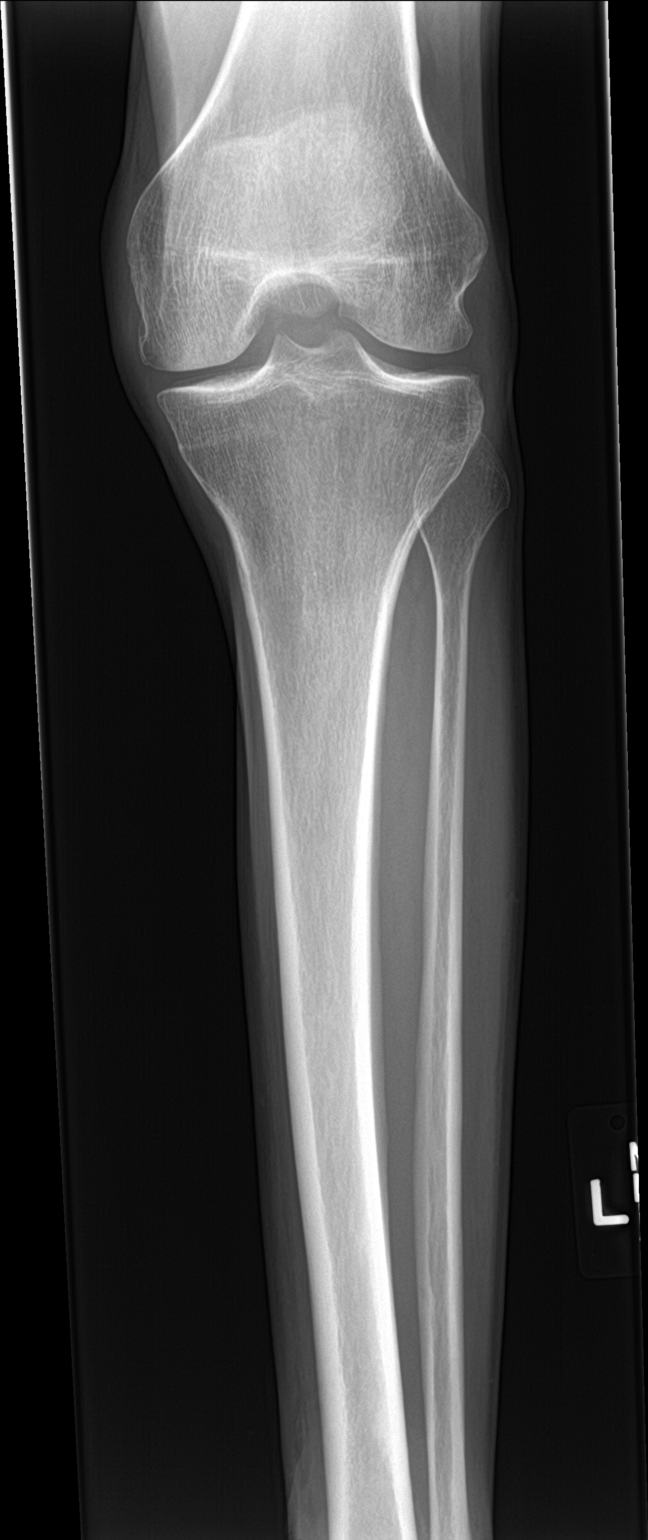

[tibia ap (2 of 2)]
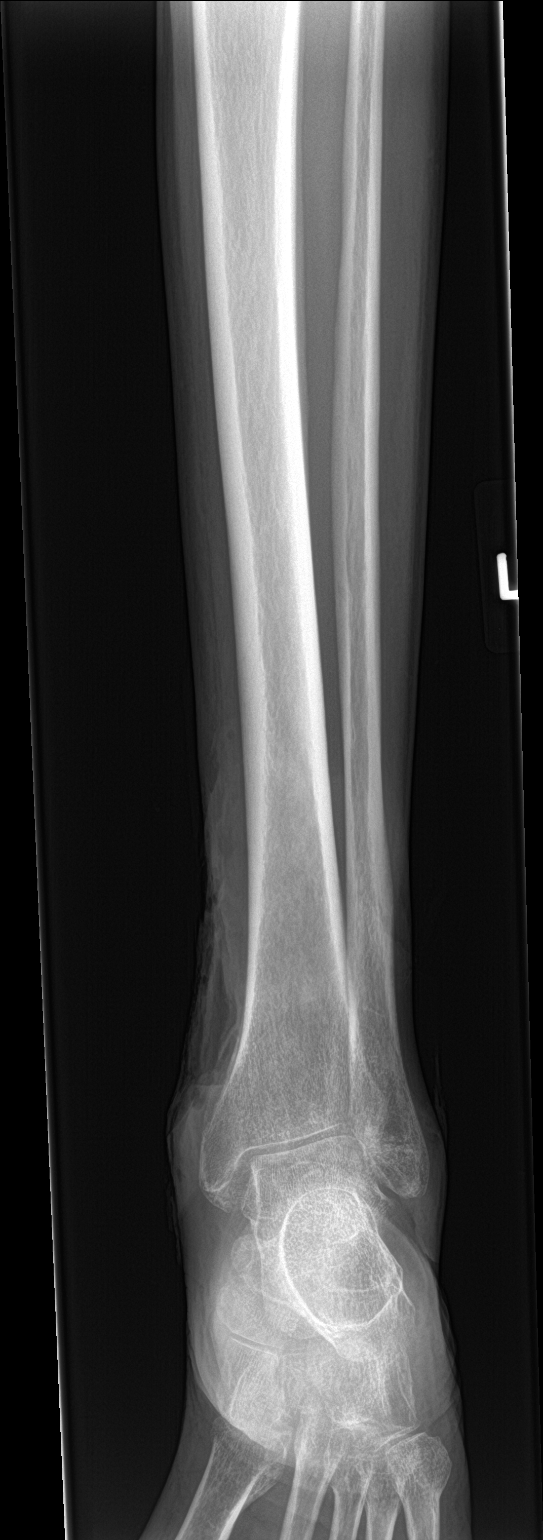

[tibia lat (1 of 2)]
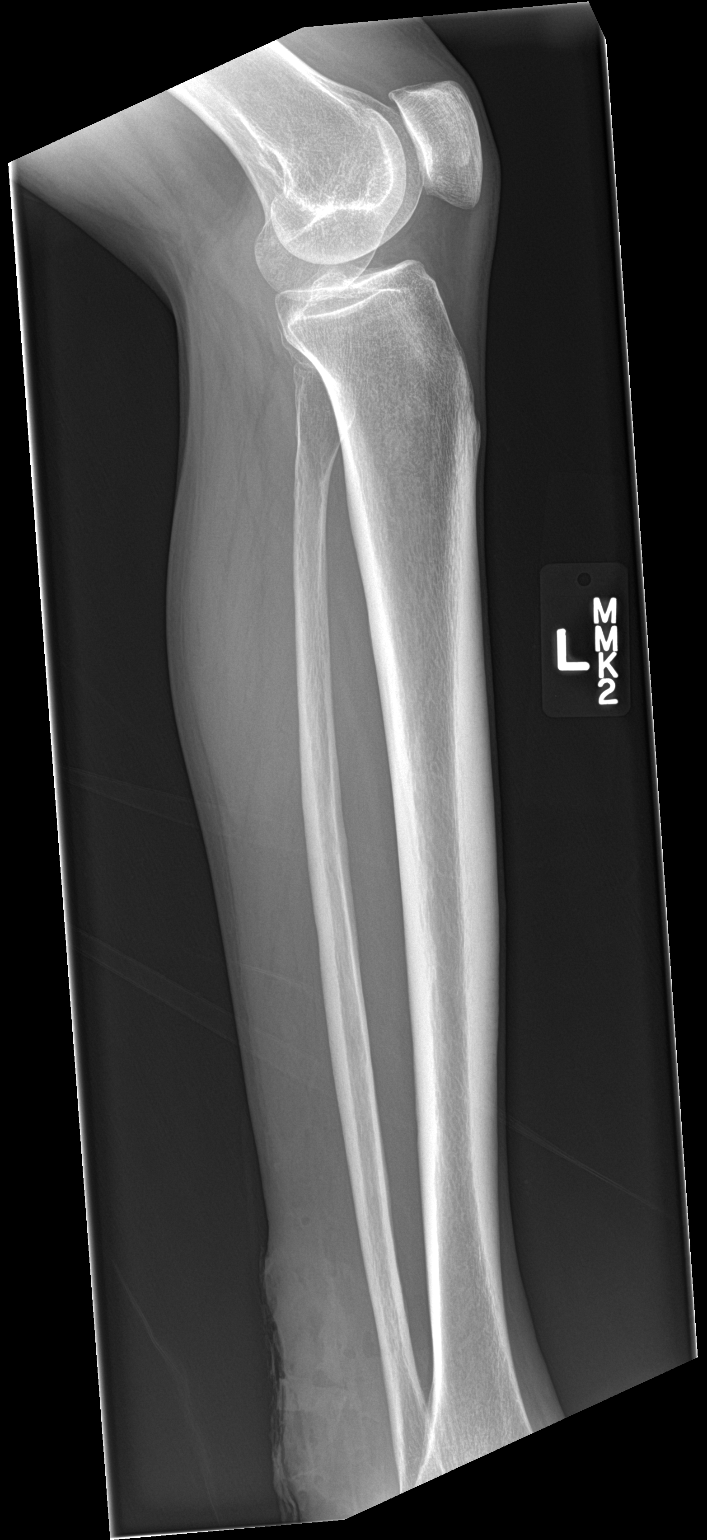

[tibia lat (2 of 2)]
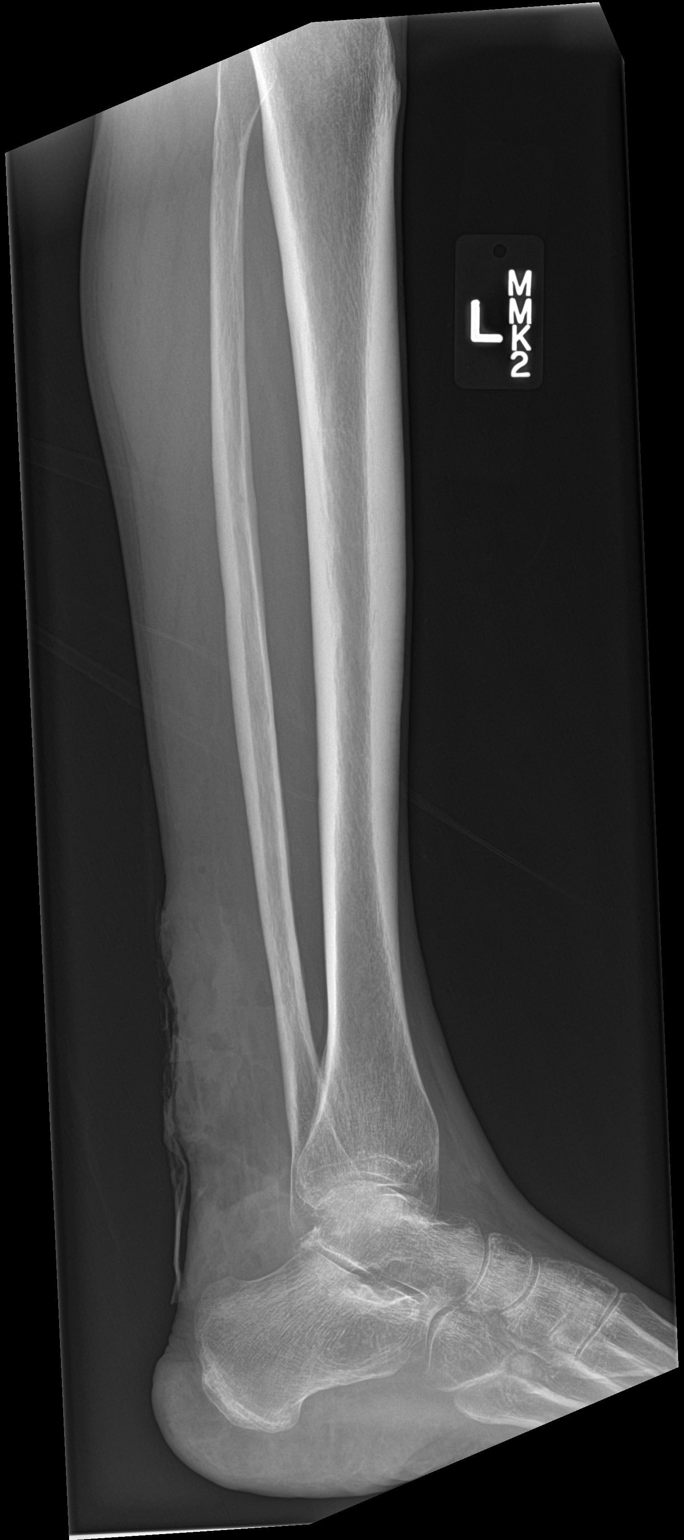

[4 of 4 positions shown; findings below may reference images not displayed]

FINDINGS: Soft tissue ulceration of the distal leg posteriorly and medially.
No frank bony destruction to confirm osteomyelitis. No soft tissue
foreign body or soft tissue gas beyond the ulcer.
IMPRESSION: Soft tissue ulceration without frank radiographic evidence of
osteomyelitis. MRI would be more sensitive if additional imaging is
clinically warranted.

## 2019-03-06 IMAGING — CR DG TIBIA/FIBULA 2V*L*
4 series · 4 of 4 positions shown · non-contrast
Comparison: Left tibia and fibula February 06, 2017

CLINICAL DATA: Bilateral open and infected appearing leg wounds

EXAM:
LEFT TIBIA AND FIBULA - 2 VIEW

[tibia ap (1 of 2)]
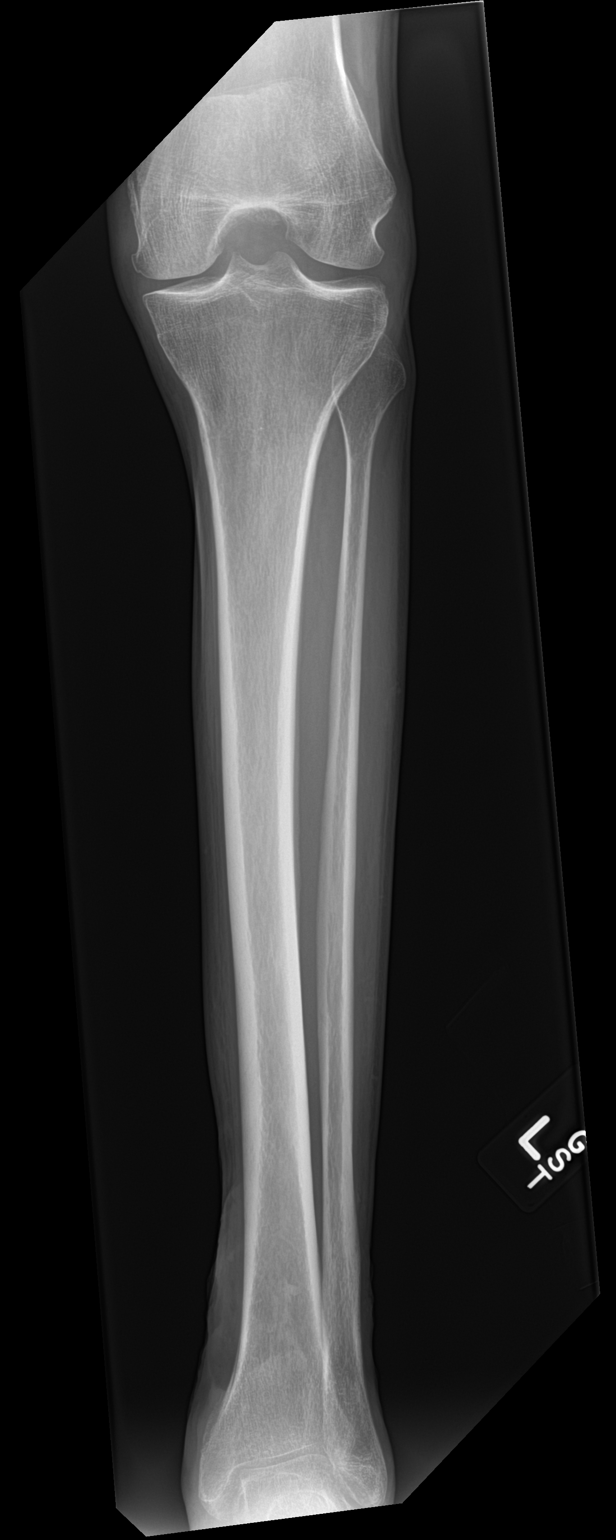

[tibia ap (2 of 2)]
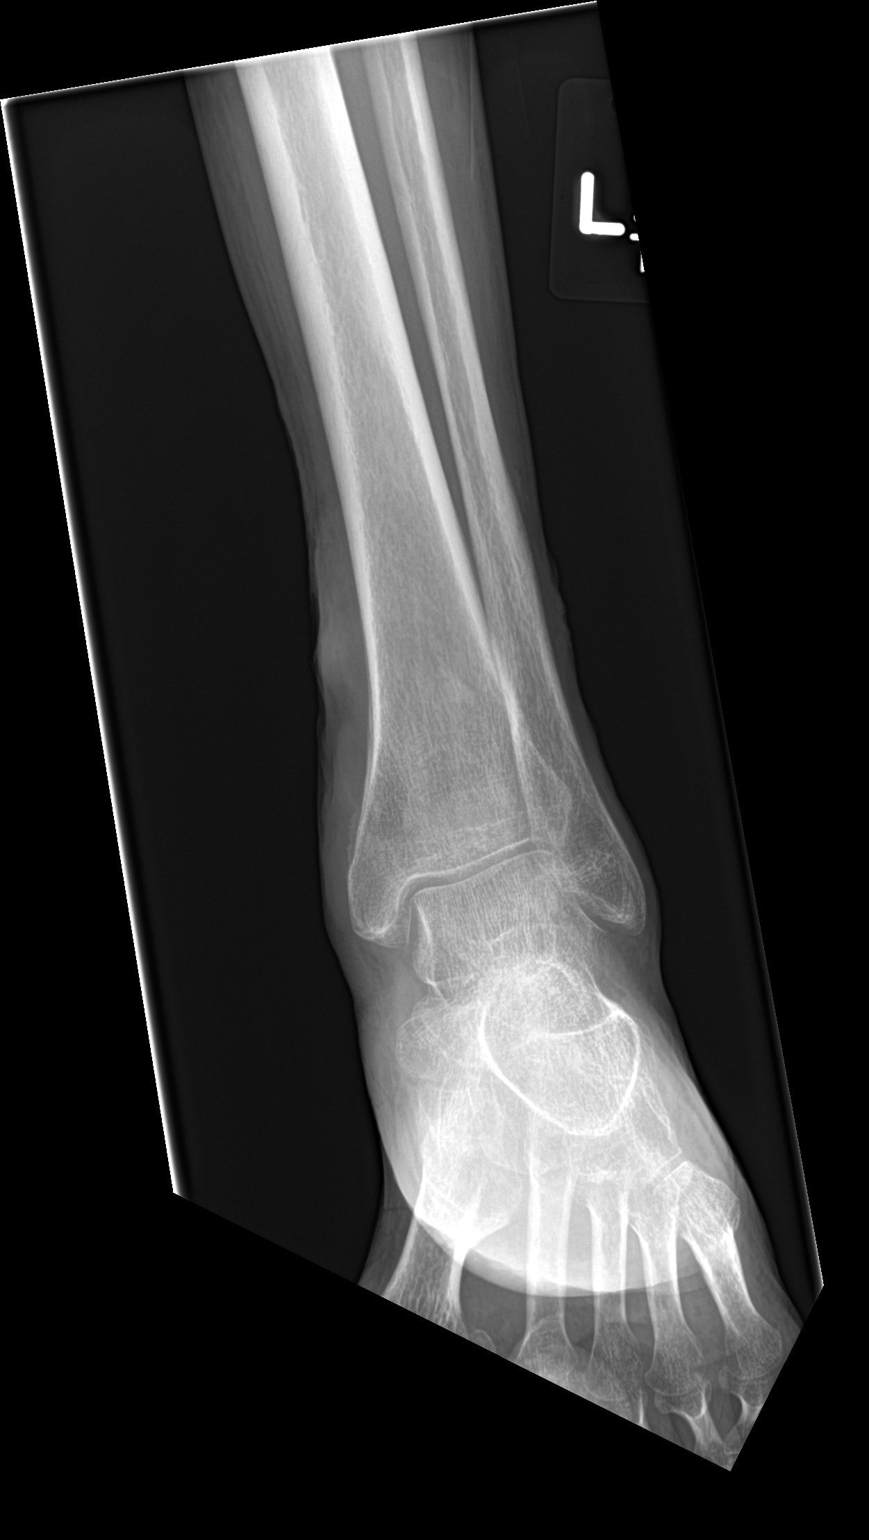

[tibia lat (1 of 2)]
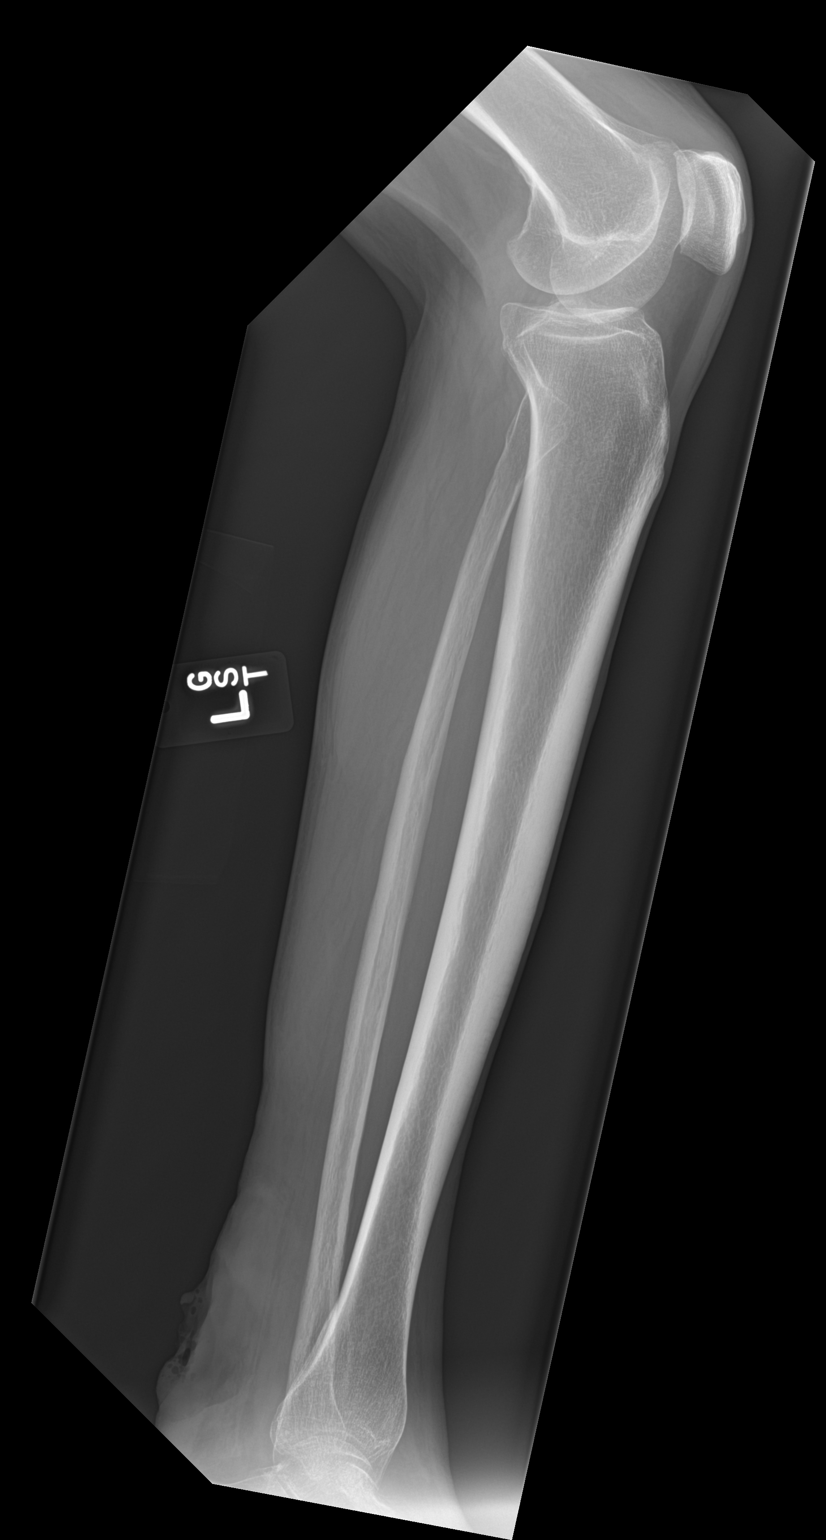

[tibia lat (2 of 2)]
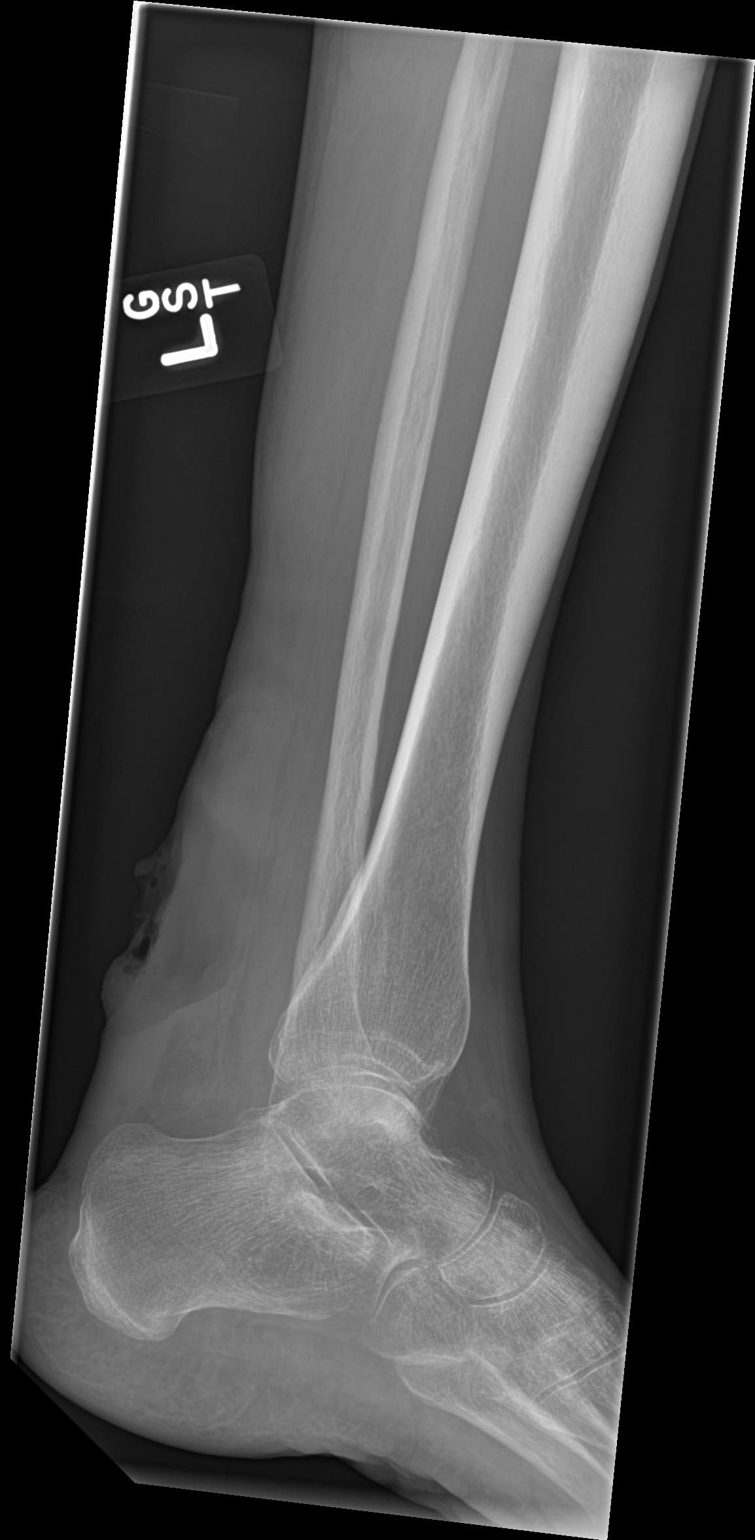

[4 of 4 positions shown; findings below may reference images not displayed]

FINDINGS: The bones are subjectively adequately mineralized. There is no lytic
or blastic lesion or periosteal reaction. There are deep ulcers
noted posteriorly and laterally over the distal leg. The observed
portions of the knee and ankle are normal.
IMPRESSION: Soft tissue ulceration a similar to that seen previously. No
radiographic evidence of acute osteomyelitis. As mentioned on the
previous study, MRI would be more sensitive for detection of early
changes of osteomyelitis.

## 2019-03-06 IMAGING — CR DG TIBIA/FIBULA 2V*R*
3 series · 3 of 3 positions shown · non-contrast
Comparison: Right tibia and fibula February 06, 2017

CLINICAL DATA: Bilateral open leg wounds.

EXAM:
RIGHT TIBIA AND FIBULA - 2 VIEW

[tibia ap (1 of 2)]
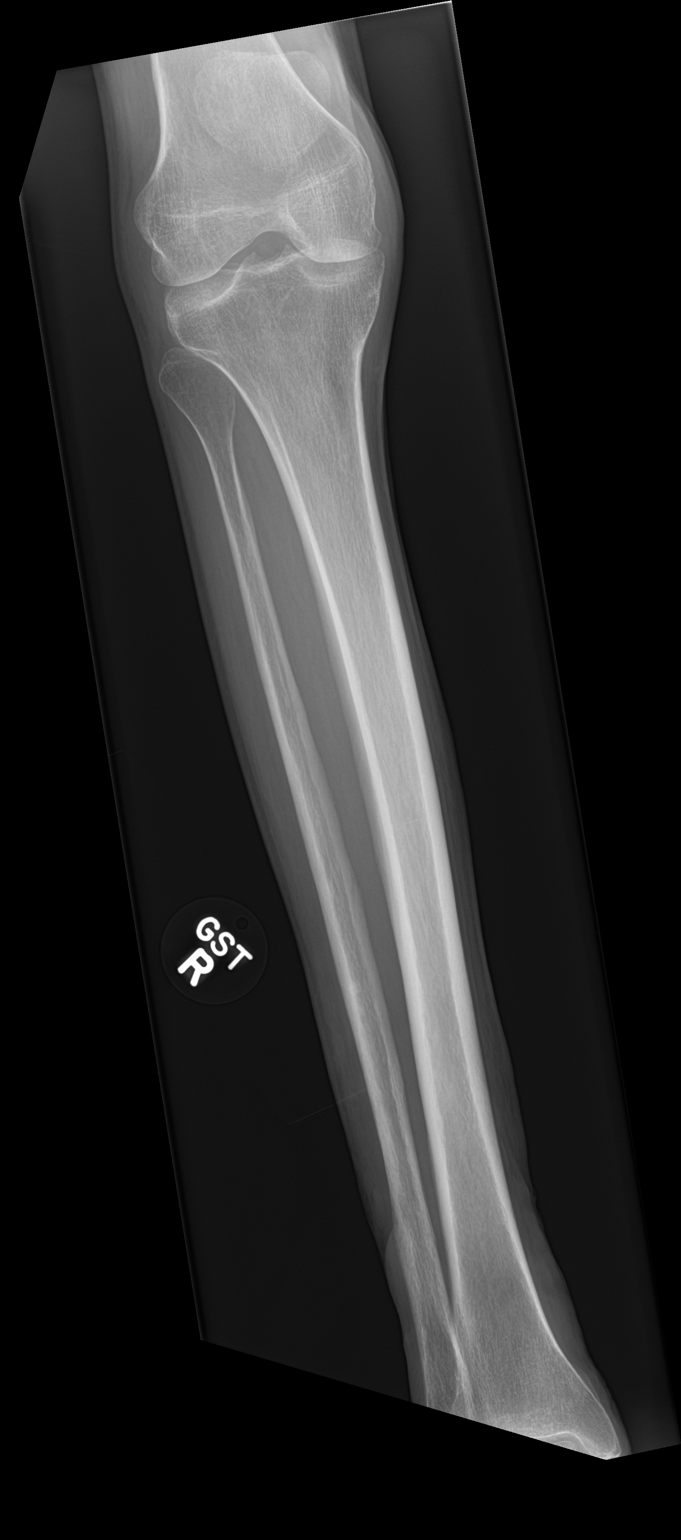

[tibia ap (2 of 2)]
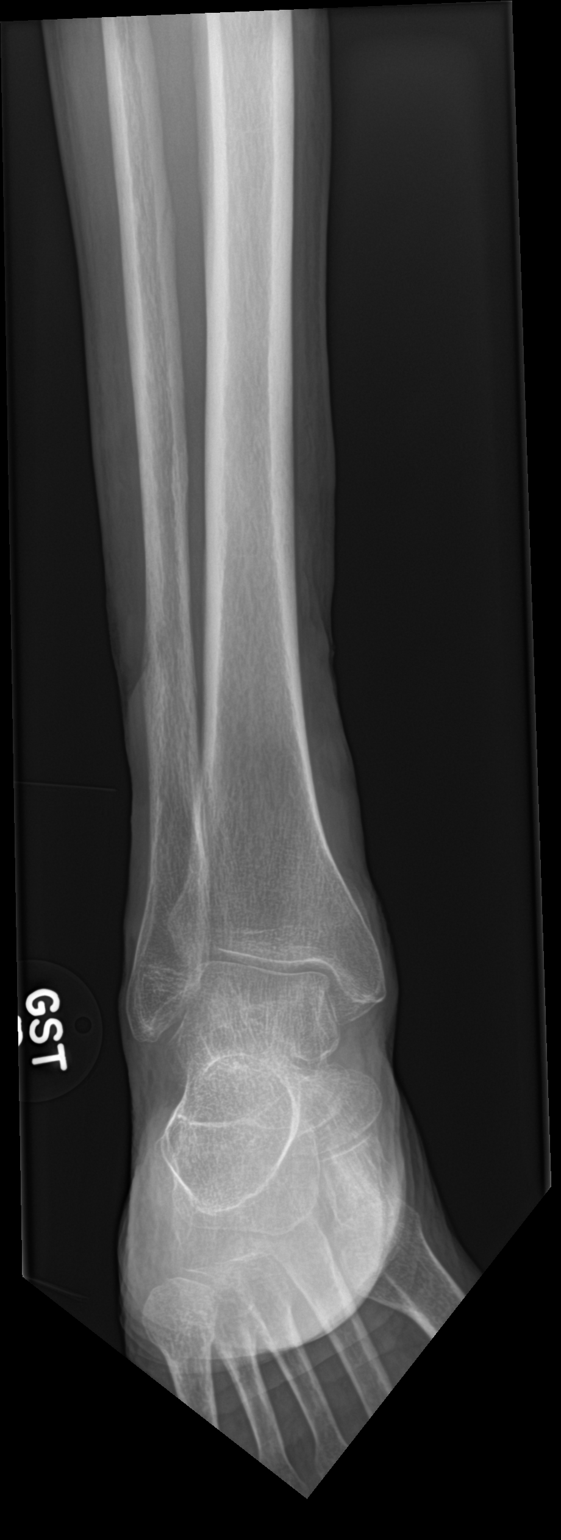

[tibia lat]
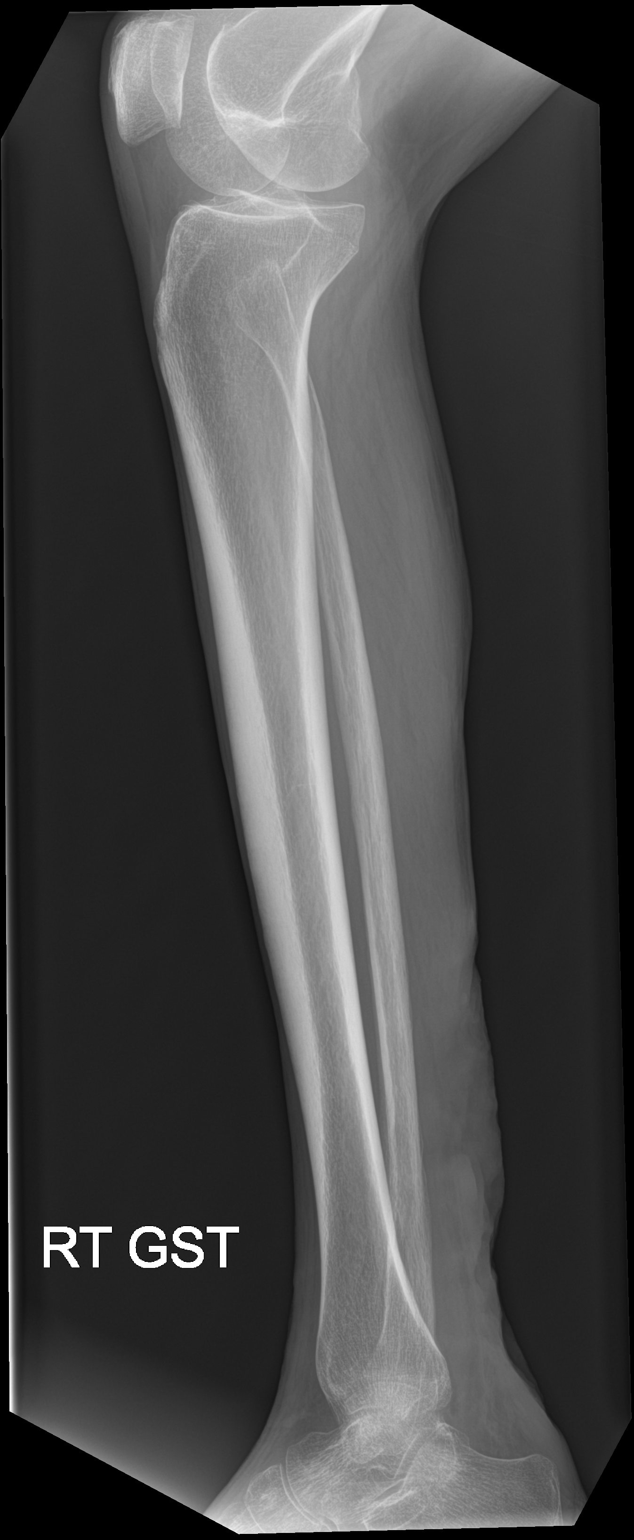

[3 of 3 positions shown; findings below may reference images not displayed]

FINDINGS: The bones are subjectively adequately mineralized. Suspected
osteolysis involving the proximal aspect of the medial malleolus
seen previously is not evident today. There is subtle periosteal
reaction along the posterior aspect of the distal tibia. The ankle
joint mortise is preserved. The knee exhibits no acute abnormality.
There are deep soft tissue wounds over the posterior [DATE] of the leg.
IMPRESSION: Subtle periosteal reaction along the posterior aspect of the lower
third of the diaphysis of the fibula. The medial malleolar changes
seen on the previous study are not evident today.

Deep wounds over the posterior soft tissues of the leg.

## 2019-03-14 IMAGING — DX DG TIBIA/FIBULA 2V*R*
4 series · 4 of 4 positions shown · non-contrast
Comparison: 03/22/2017

CLINICAL DATA: Pt has hx of ulcer with gangrene in [REDACTED] and had
surgery [REDACTED]. C/o bilateral lower leg and foot pain,

EXAM:
RIGHT TIBIA AND FIBULA - 2 VIEW

[tibia ap (1 of 2)]
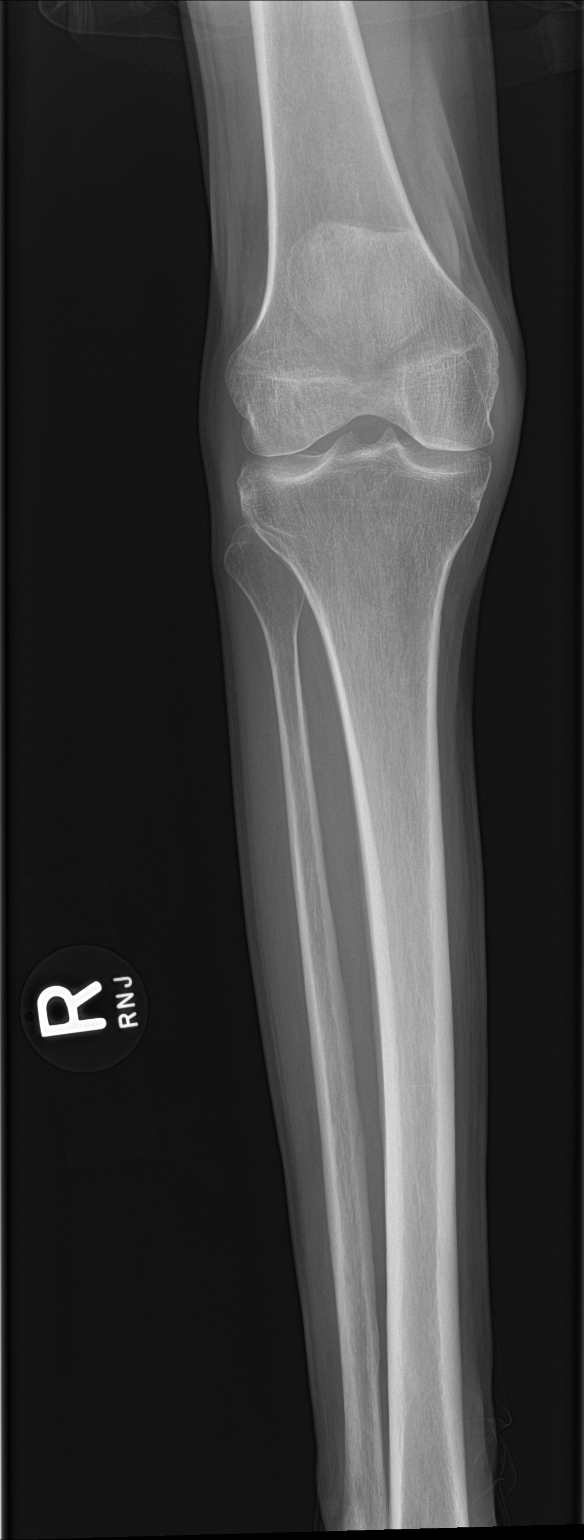

[tibia ap (2 of 2)]
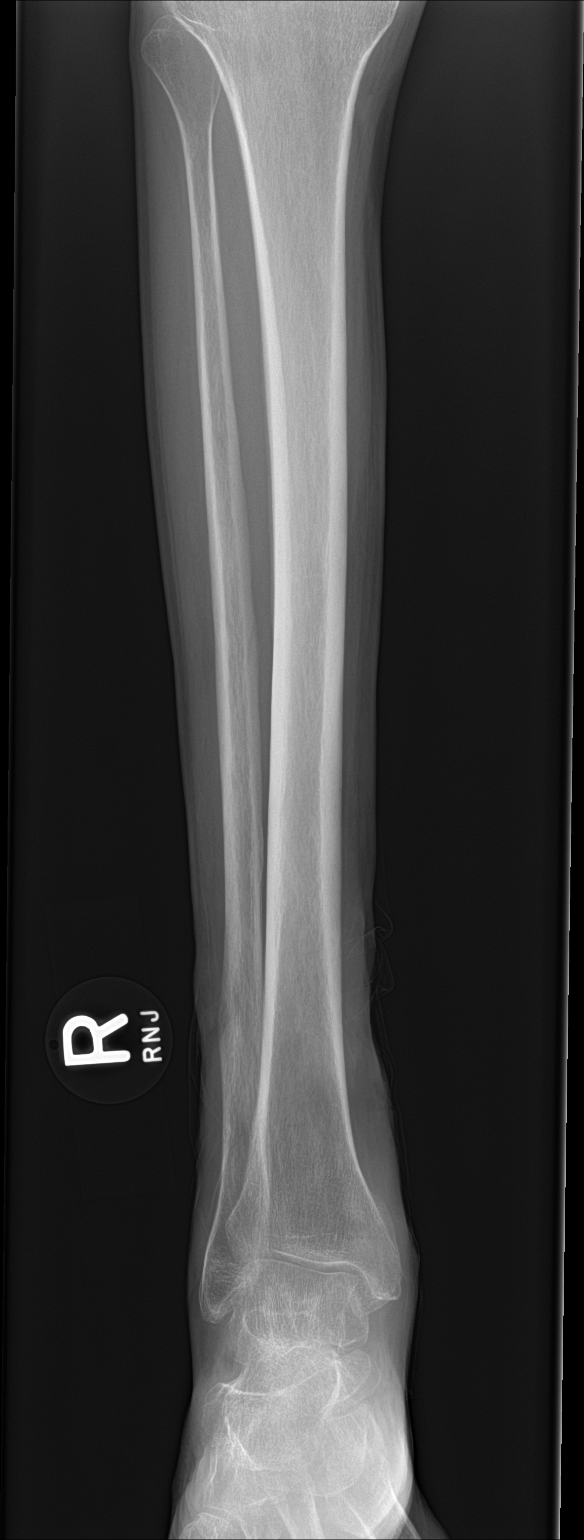

[tibia lat (1 of 2)]
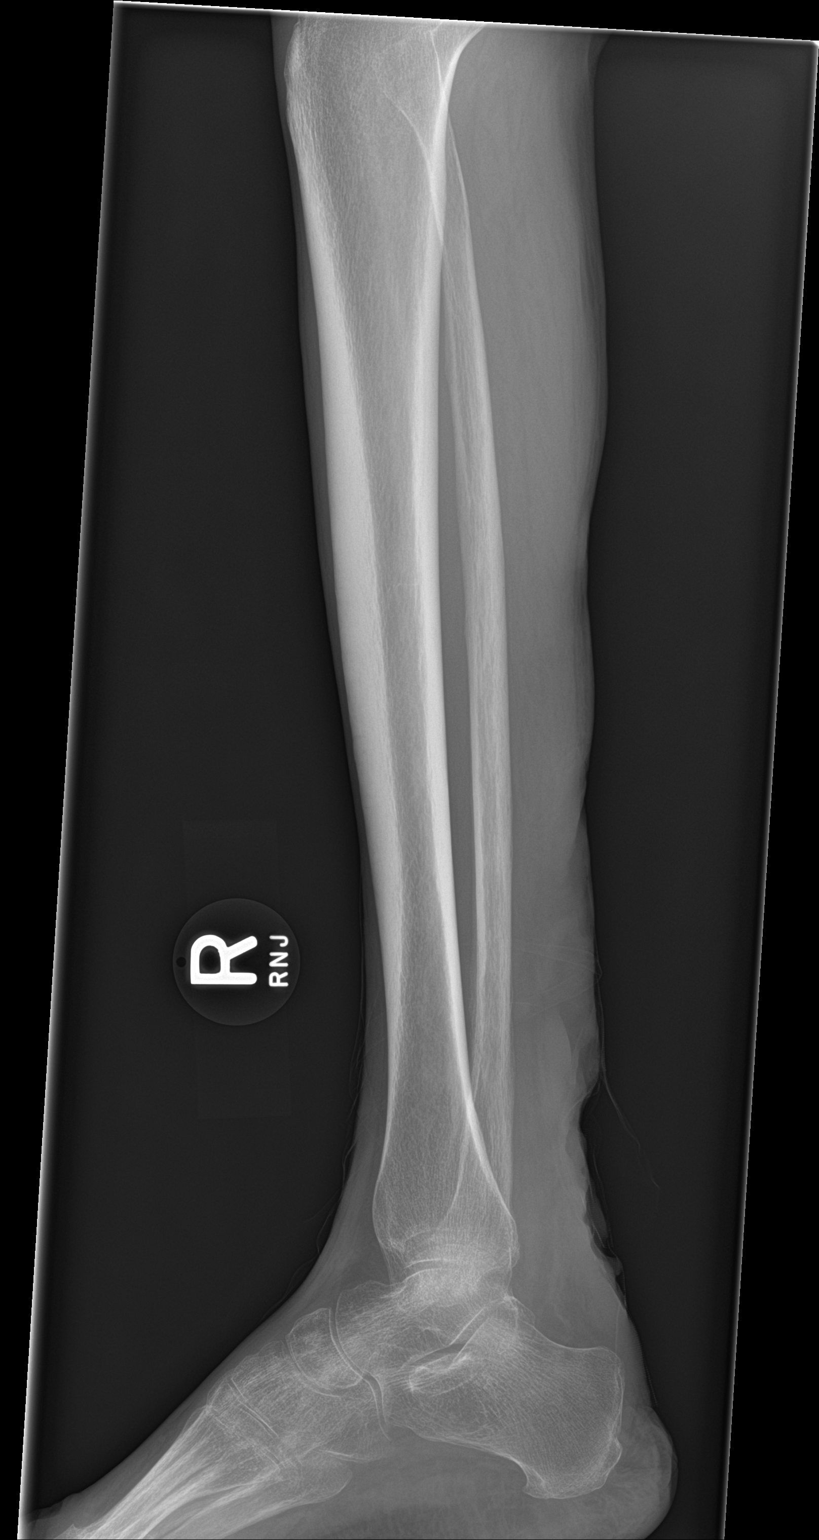

[tibia lat (2 of 2)]
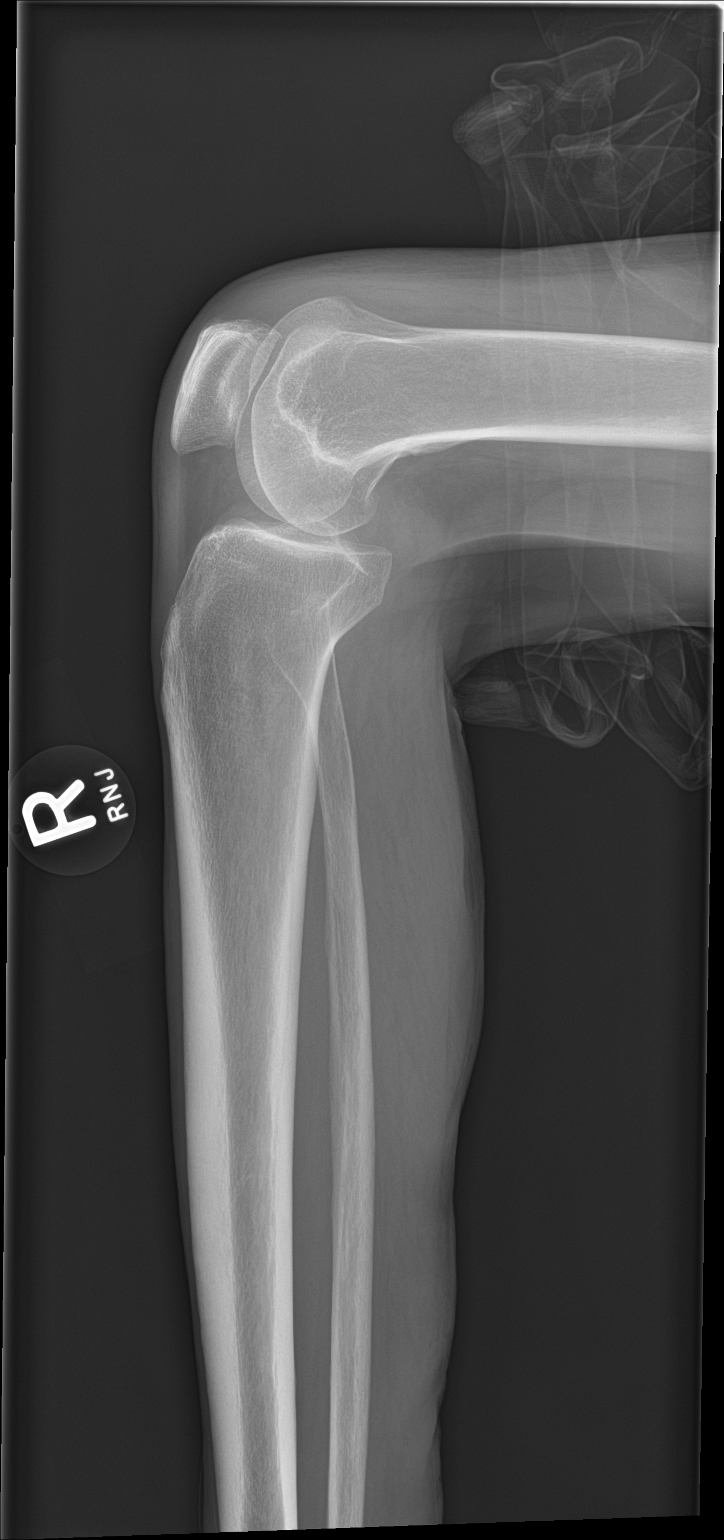

[4 of 4 positions shown; findings below may reference images not displayed]

FINDINGS: No fracture.  No bone lesion.

No bone resorption is seen to suggest osteomyelitis.

The knee and ankle joints are normally spaced and aligned.

There is a soft tissue wound is most apparent posteriorly, involving
the distal leg and posterior ankle. This may disrupts the Achilles
tendon.
IMPRESSION: 1. No fracture, bone lesion or evidence of osteomyelitis.

## 2019-03-14 IMAGING — DX DG FOOT COMPLETE 3+V*R*
3 series · 3 of 3 positions shown · non-contrast
Comparison: Right foot MRI, 02/06/2017

CLINICAL DATA: Pt has hx of ulcer with gangrene in [REDACTED] and had
surgery [REDACTED]. C/o bilateral lower leg and foot pain,

EXAM:
RIGHT FOOT COMPLETE - 3+ VIEW

[foot ap]
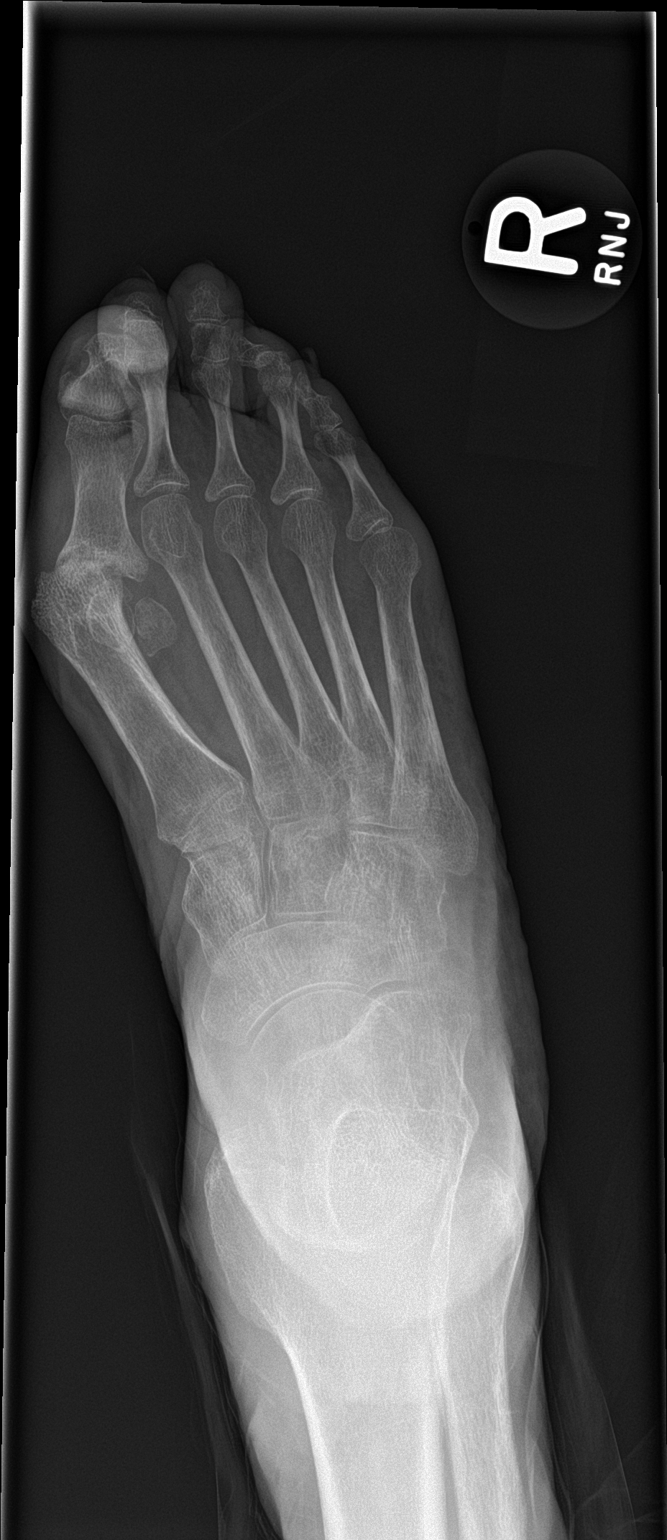

[foot obl]
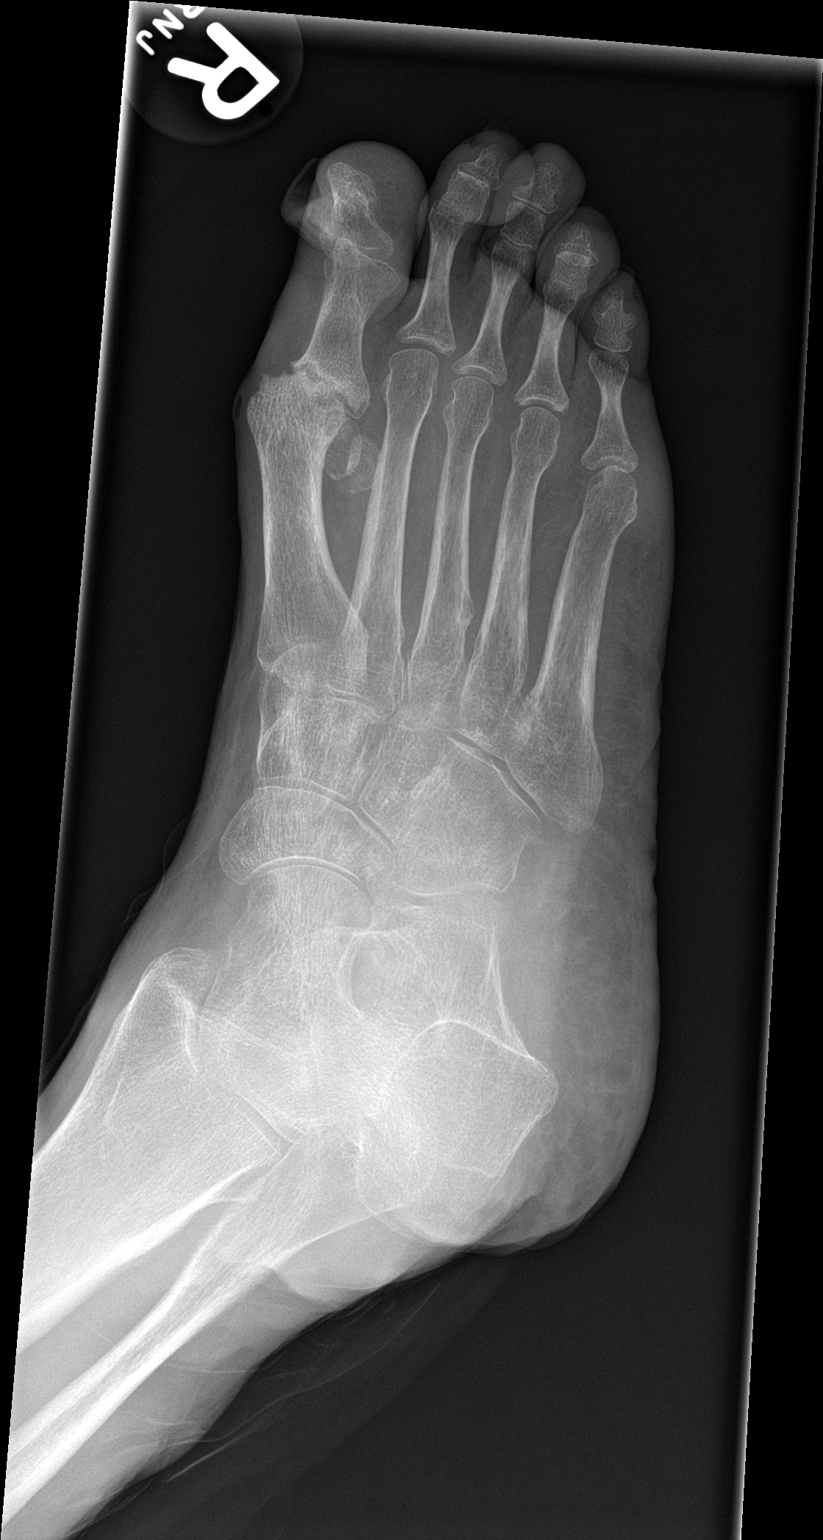

[foot lat]
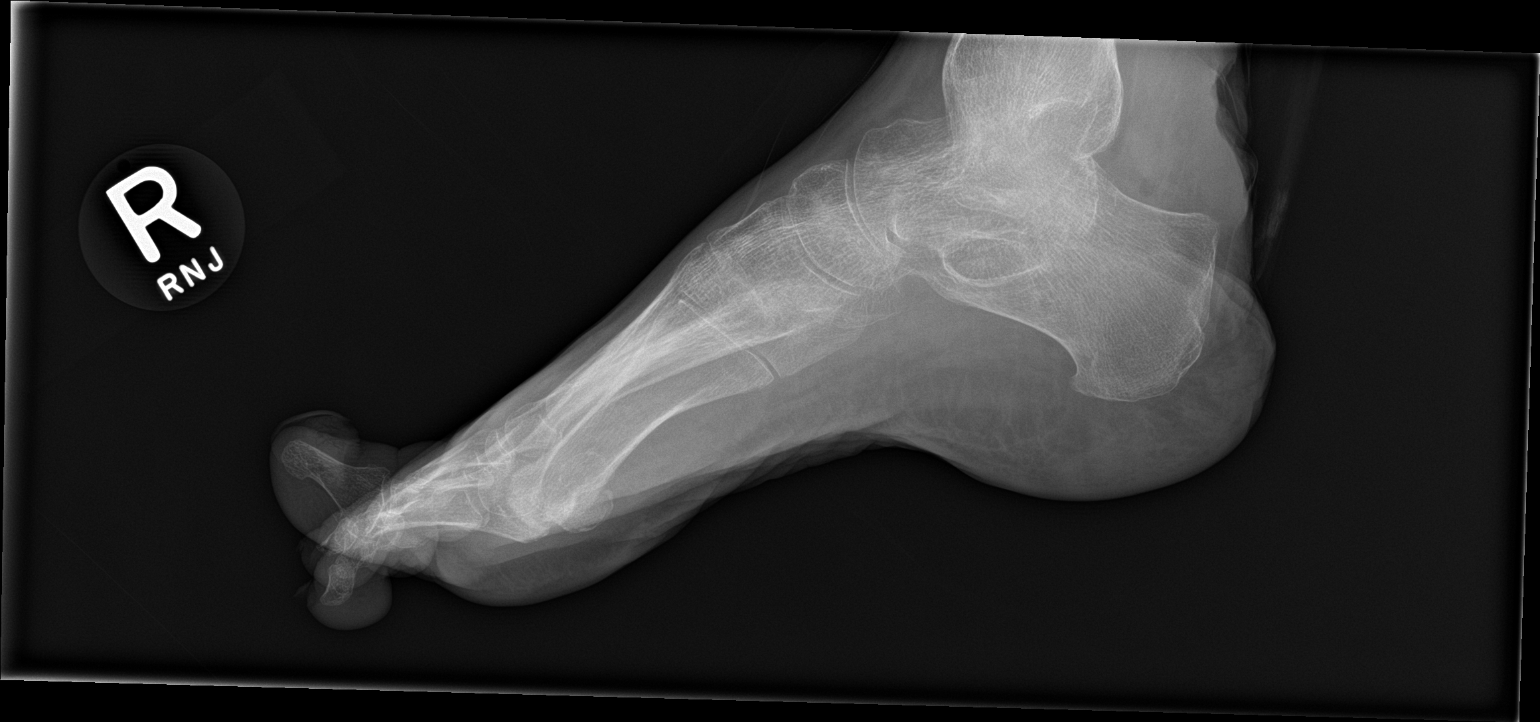

[3 of 3 positions shown; findings below may reference images not displayed]

FINDINGS: There is marked joint space narrowing with subchondral erosions and
mild ordering sclerosis at the first metatarsophalangeal joint. The
proximal phalanx of the great toe has subluxed laterally in relation
to the first metatarsal head. These findings are similar to the
prior MRI.

The remaining joints are normally spaced and aligned. No other
arthropathic change.

No soft tissue swelling is evident. There is a small focus of
subcutaneous air adjacent to the medial first metatarsal head.
IMPRESSION: 1. Advanced arthropathic changes of the first metatarsophalangeal
joint, which may reflect the sequelae of a septic arthropathy or
inflammatory arthropathy.
2. Small focus of subcutaneous air adjacent to the medial first
metatarsal head.
3. No other abnormalities.

## 2019-03-14 IMAGING — DX DG FOOT COMPLETE 3+V*L*
3 series · 3 of 3 positions shown · non-contrast
Comparison: 02/06/2017 and 12/22/2016 and 09/09/2016.

CLINICAL DATA: Pt has hx of ulcer with gangrene in [REDACTED] and had
surgery [REDACTED]. C/o bilateral lower leg and foot pain.

EXAM:
LEFT FOOT - COMPLETE 3+ VIEW

[foot ap]
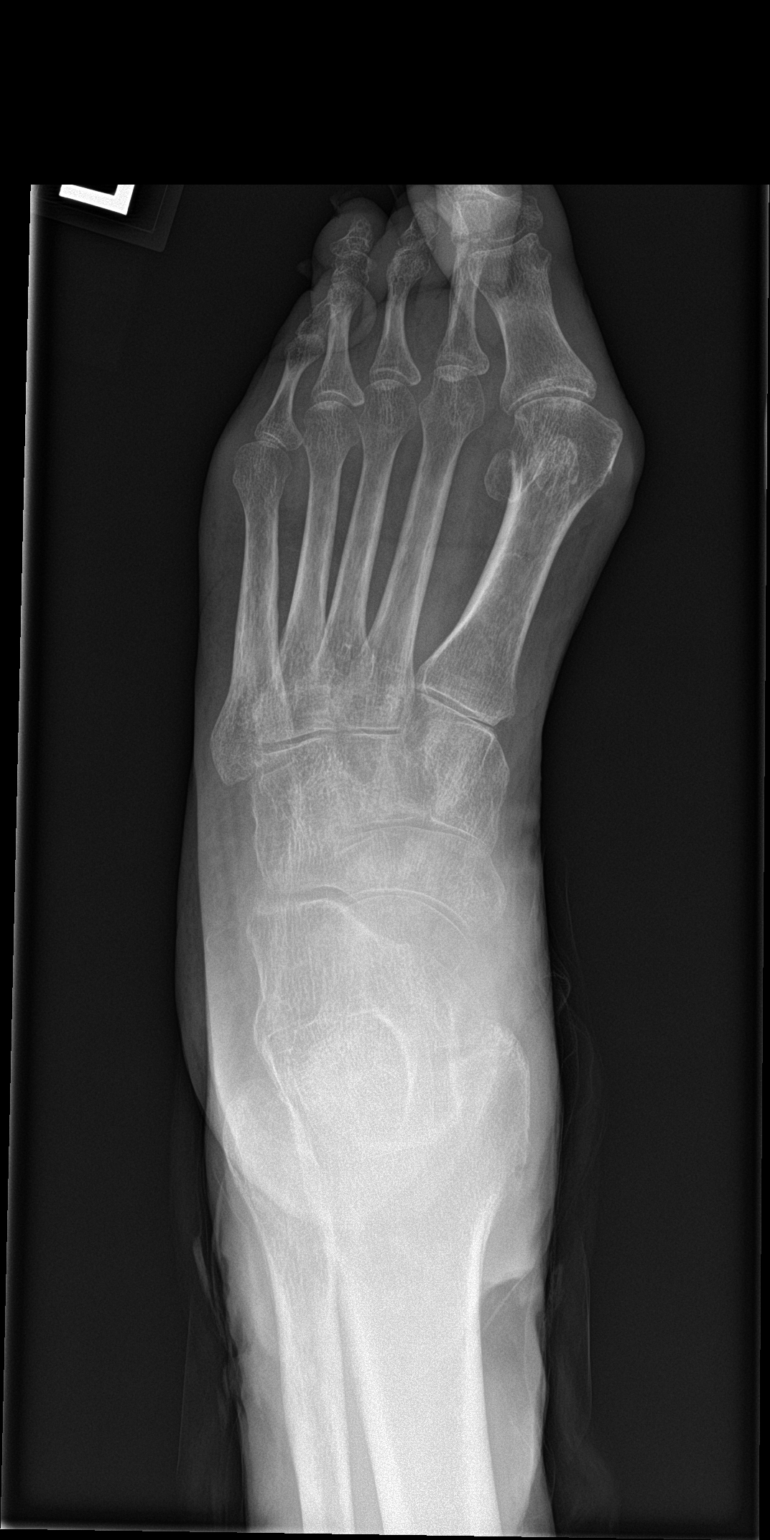

[foot obl]
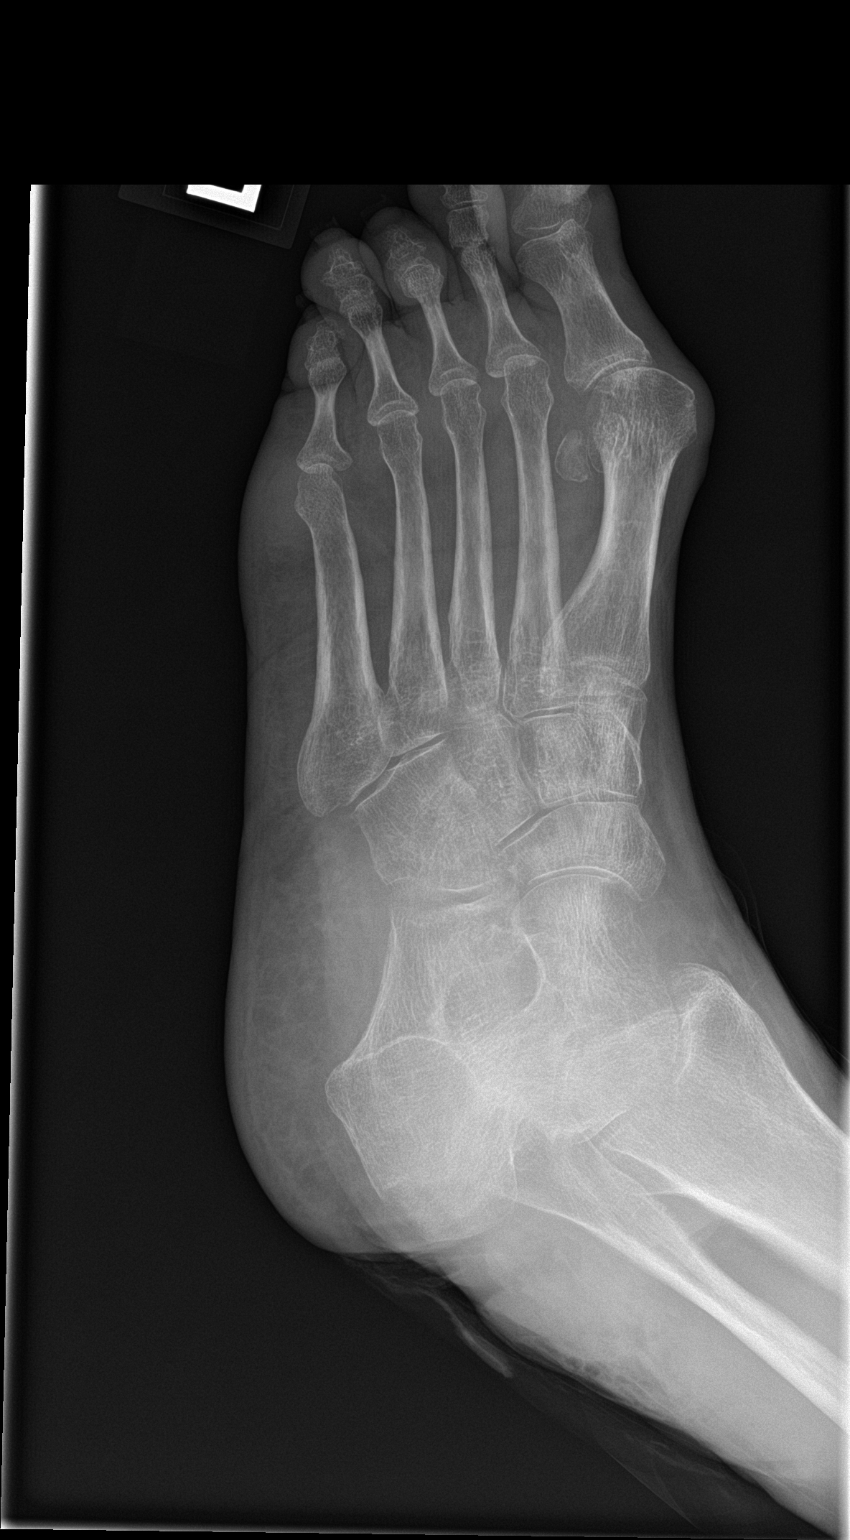

[foot lat]
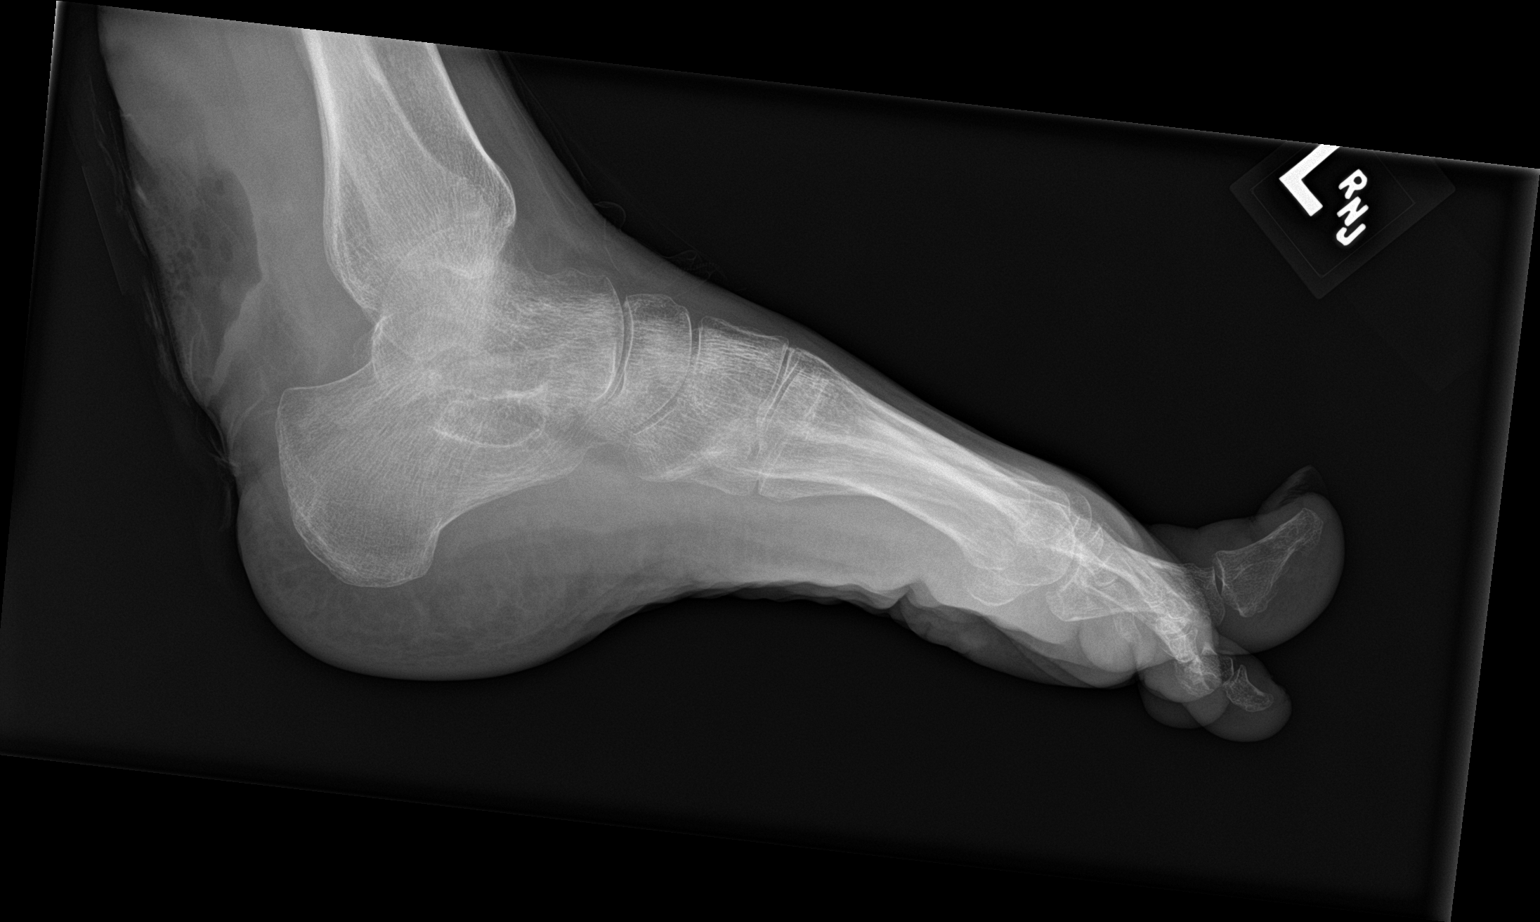

[3 of 3 positions shown; findings below may reference images not displayed]

FINDINGS: No fracture.  No bone lesion.

There are periarticular erosions at the PIP joint of the second toe,
new since 09/09/2016. This could reflect the sequelae of a septic
arthropathy. It may be due to an inflammatory arthropathy. There is
no current associated soft tissue swelling.

There are no other areas of bone resorption. There is no convincing
osteomyelitis.

The other joints are normally spaced and aligned.

There is a soft tissue wound over the dorsal aspect of the distal
leg and ankle. Several bubbles of soft tissue air are noted.
IMPRESSION: 1. No convincing osteomyelitis.
2. Erosive changes of the PIP joint of the second toe are likely
chronic, but are new since the study dated 09/09/2016 consistent
with either the sequelae of an inflammatory arthropathy or septic
arthritis.
3. Soft tissue ulcer over the dorsal aspect of the lower leg and
ankle.

## 2019-03-14 IMAGING — DX DG TIBIA/FIBULA 2V*L*
4 series · 4 of 4 positions shown · non-contrast
Comparison: 03/22/2017

CLINICAL DATA: Pt has hx of ulcer with gangrene in [REDACTED] and had
surgery [REDACTED]. C/o bilateral lower leg and foot pain,

EXAM:
LEFT TIBIA AND FIBULA - 2 VIEW

[tibia ap (1 of 2)]
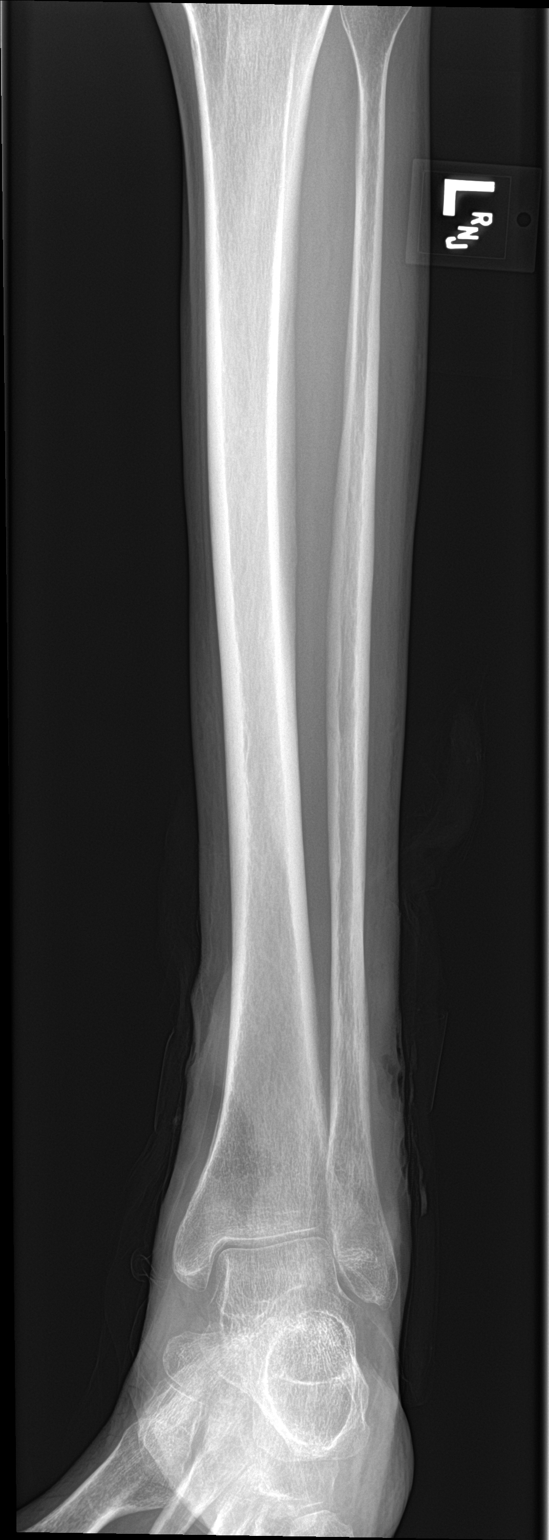

[tibia ap (2 of 2)]
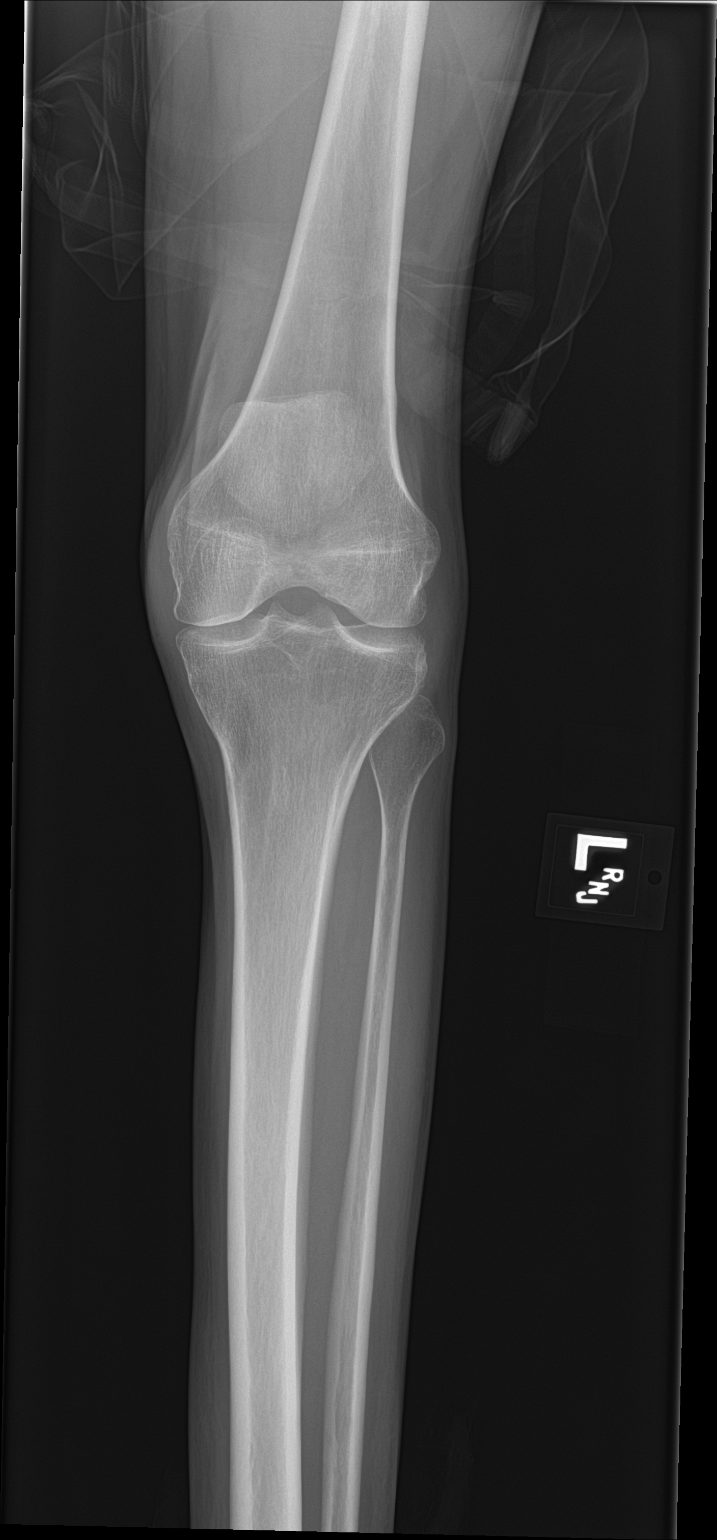

[tibia lat (1 of 2)]
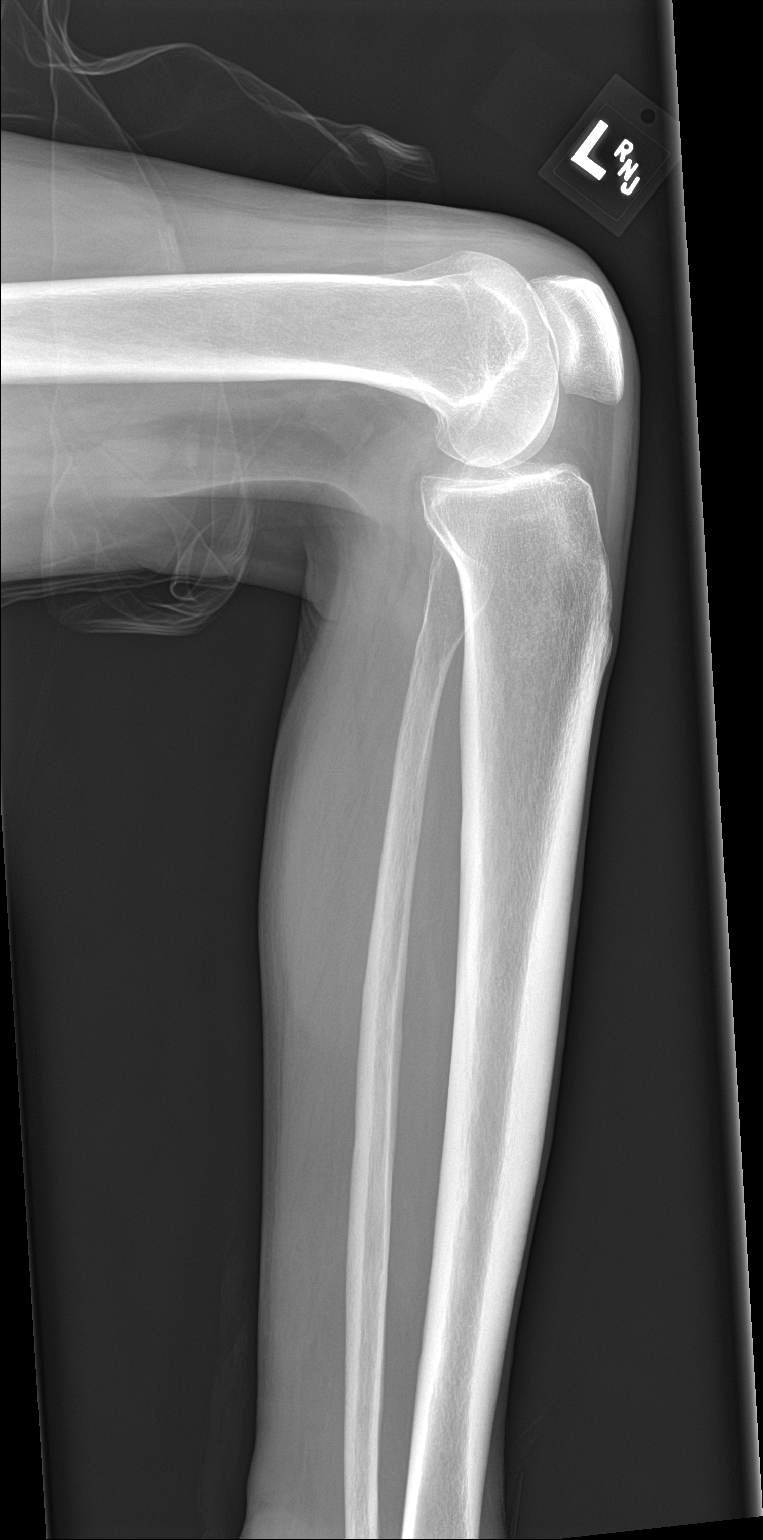

[tibia lat (2 of 2)]
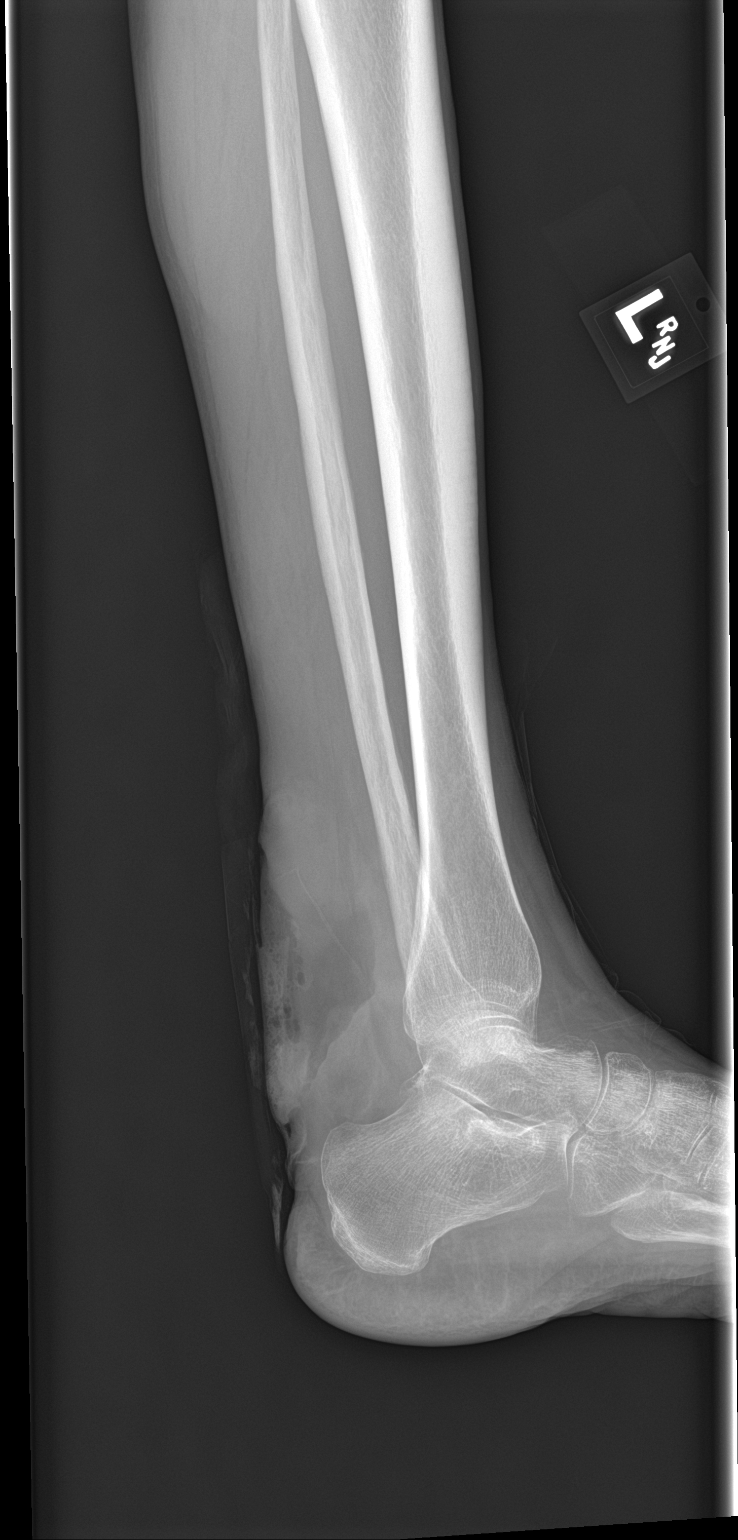

[4 of 4 positions shown; findings below may reference images not displayed]

FINDINGS: No fracture.  No bone lesion.

There is no bone resorption to suggest osteomyelitis.

Knee and ankle joints are normally spaced and aligned. No
arthropathic change.

There is a soft tissue ulcer that is most evident posteriorly,
involving the distal leg and ankle. The normal Achilles tendon
shadows non avid suggesting a disrupted Achilles tendon. There are
several bubbles of soft tissue air projecting over the wound.
IMPRESSION: 1. No fracture or bone lesion.
2. No evidence of osteomyelitis.

## 2019-03-23 IMAGING — CR DG TIBIA/FIBULA 2V*L*
3 series · 3 of 3 positions shown · non-contrast
Comparison: Left tibia/fibula radiographs performed 03/30/2017

CLINICAL DATA: Open wounds at the left leg, with severe pain.
Initial encounter.

EXAM:
LEFT TIBIA AND FIBULA - 2 VIEW

[x tib-fib ap left (1 of 2)]
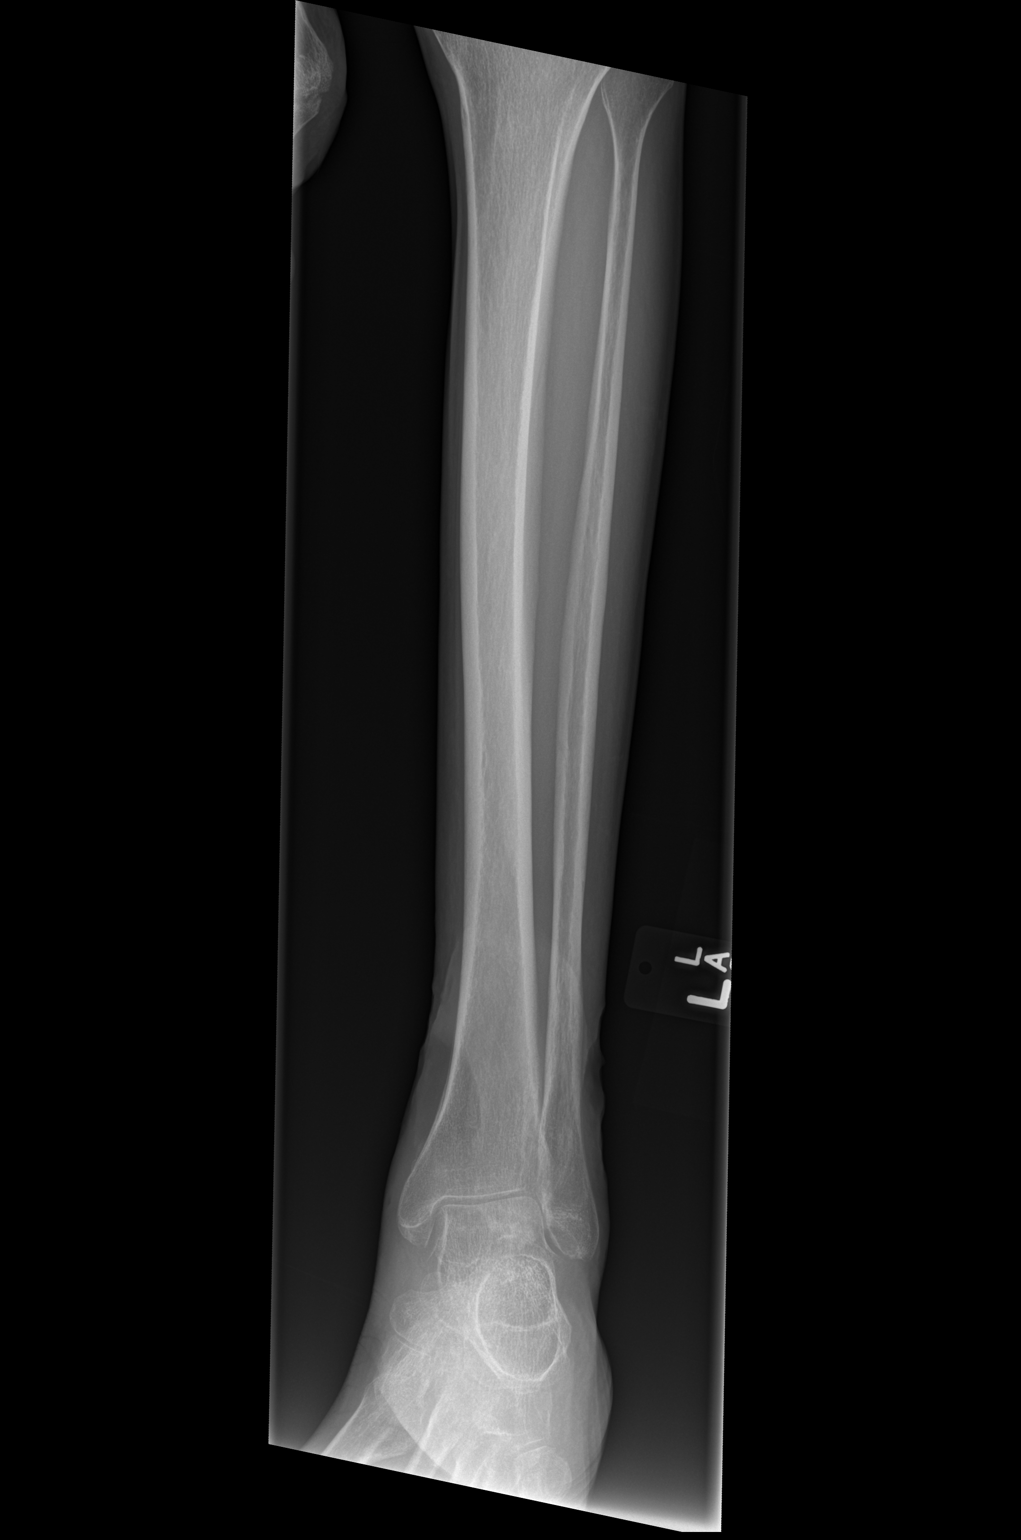

[x tib-fib ap left (2 of 2)]
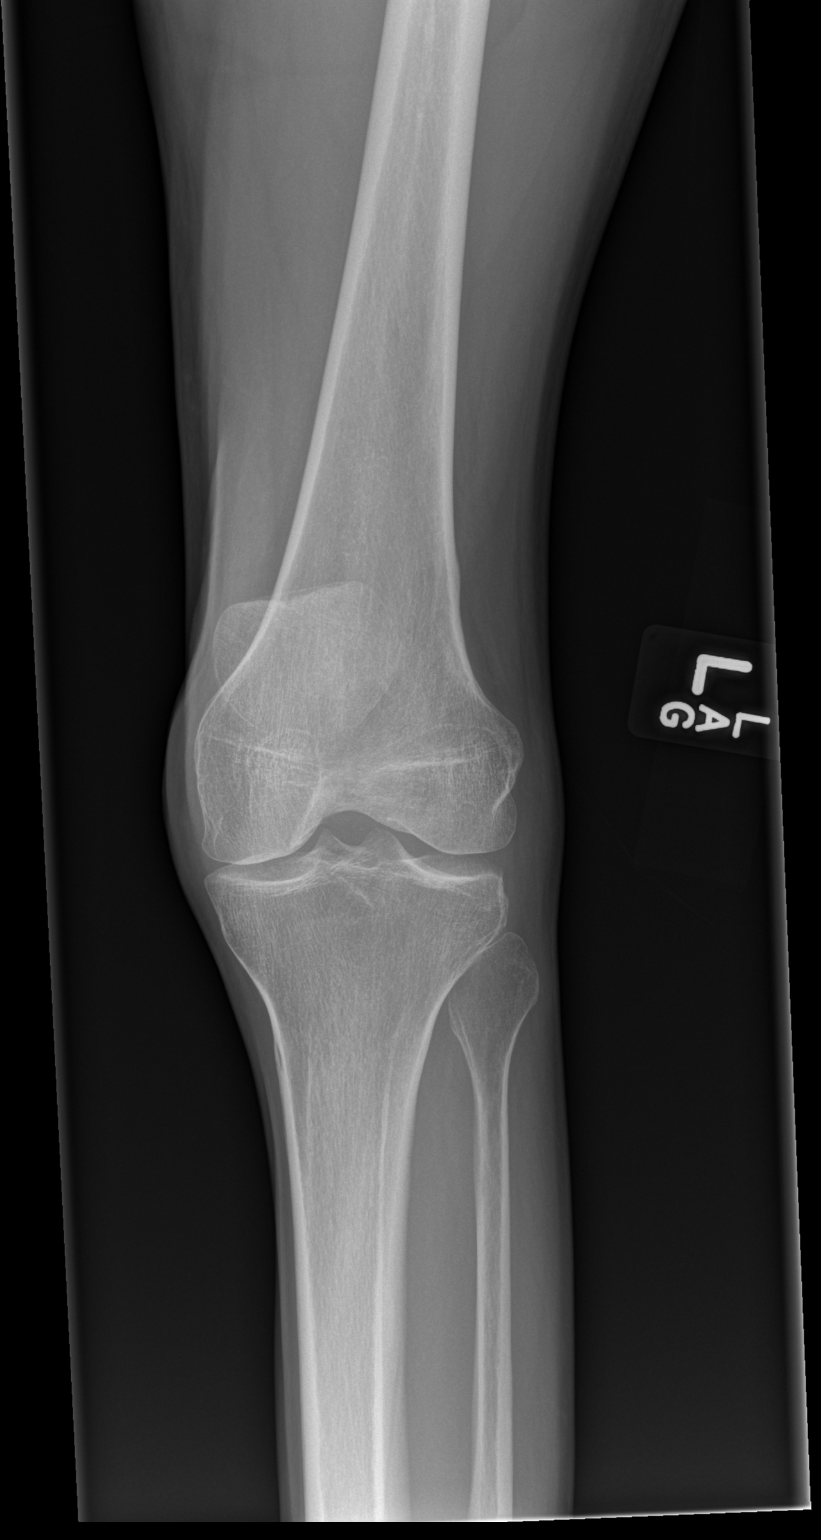

[x tib-fib lat left]
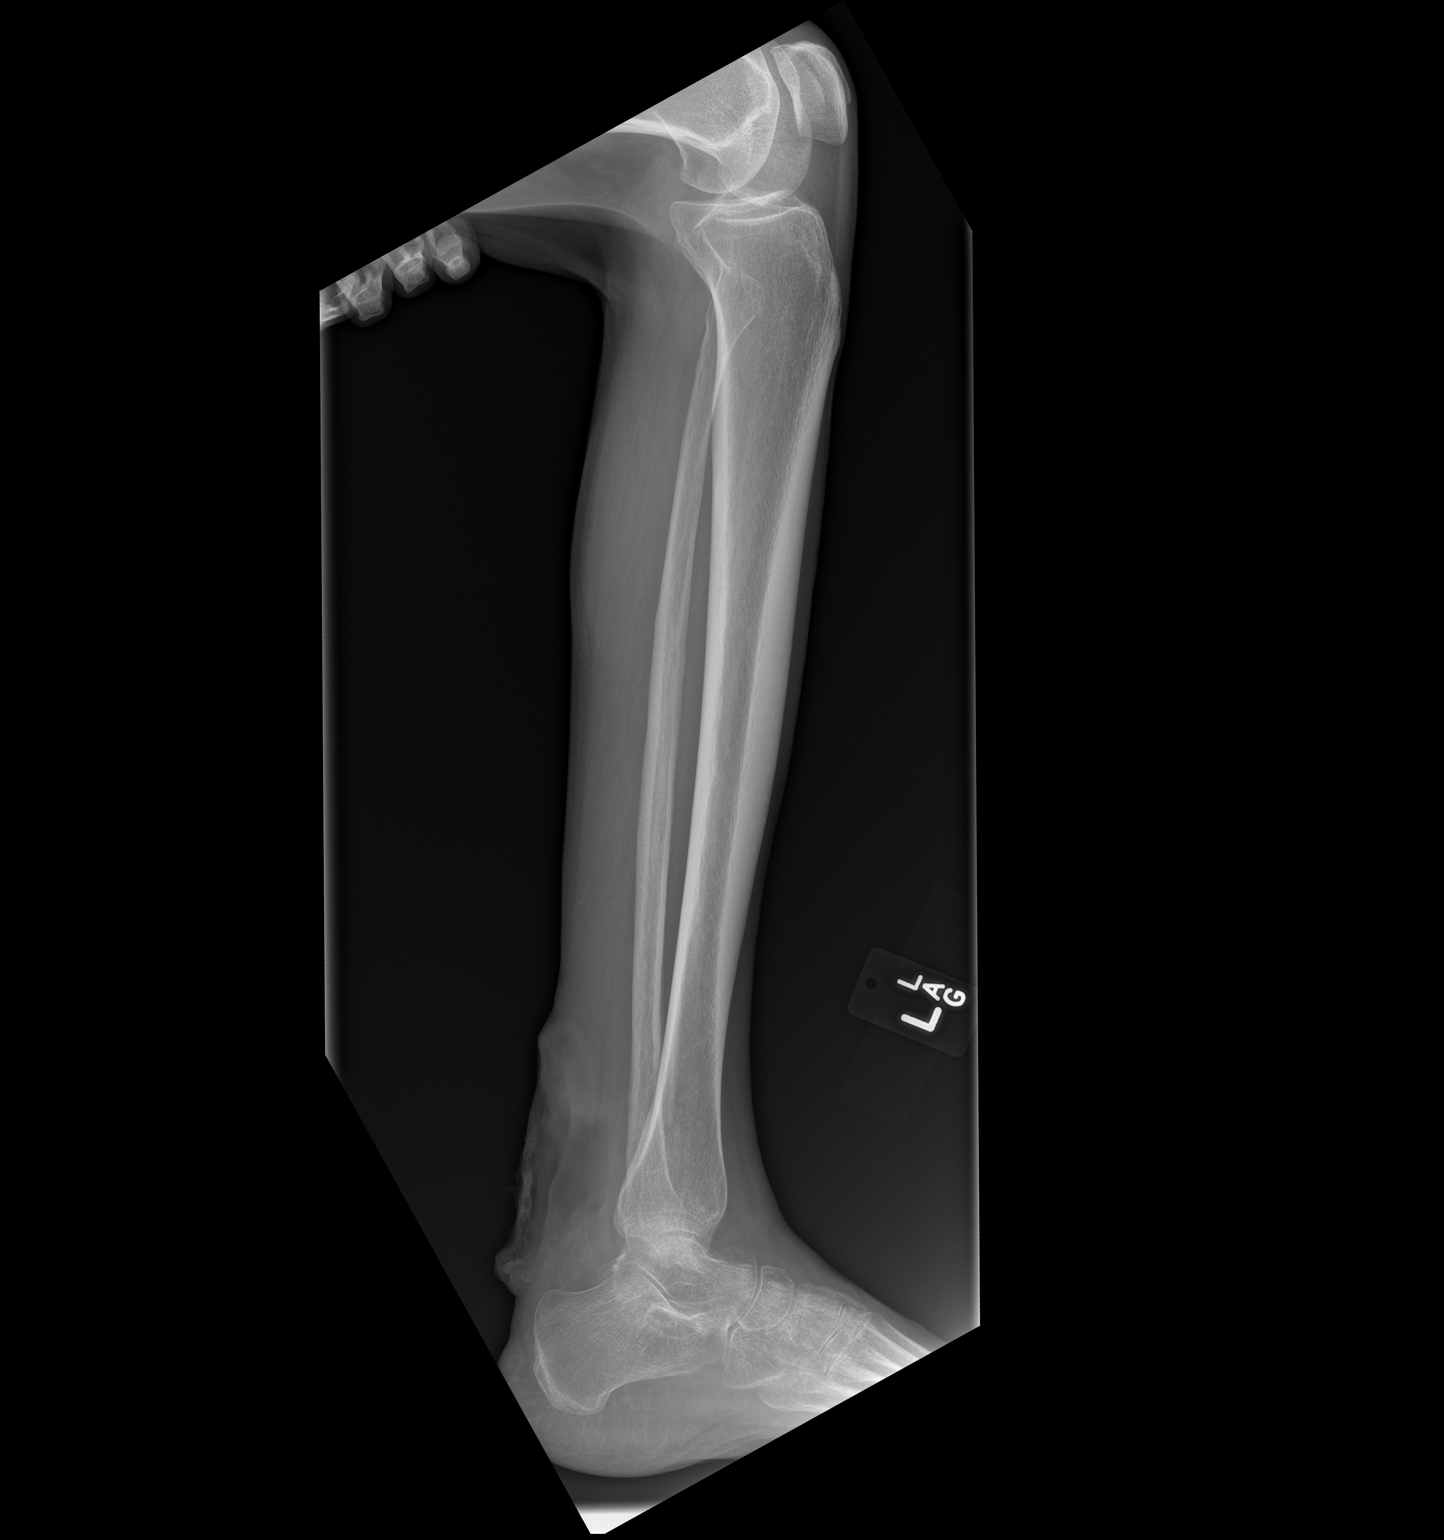

[3 of 3 positions shown; findings below may reference images not displayed]

FINDINGS: There is no evidence of fracture or dislocation. The tibia and
fibula appear grossly intact. The ankle mortise is unremarkable in
appearance. The knee joint is unremarkable. No knee joint effusion
is identified.

A large soft tissue wound is noted overlying the expected location
of the left Achilles tendon. No definite osseous erosions are seen
at this time.
IMPRESSION: 1. No evidence of fracture or dislocation. No definite osseous
erosions seen.
2. Large soft tissue wound overlying the expected location of the
Achilles tendon.

## 2019-03-23 IMAGING — CR DG CHEST 2V
2 series · 2 of 2 positions shown · non-contrast
Comparison: Chest radiograph 10/08/2016

CLINICAL DATA: Infection

EXAM:
CHEST  2 VIEW

[w chest lat]
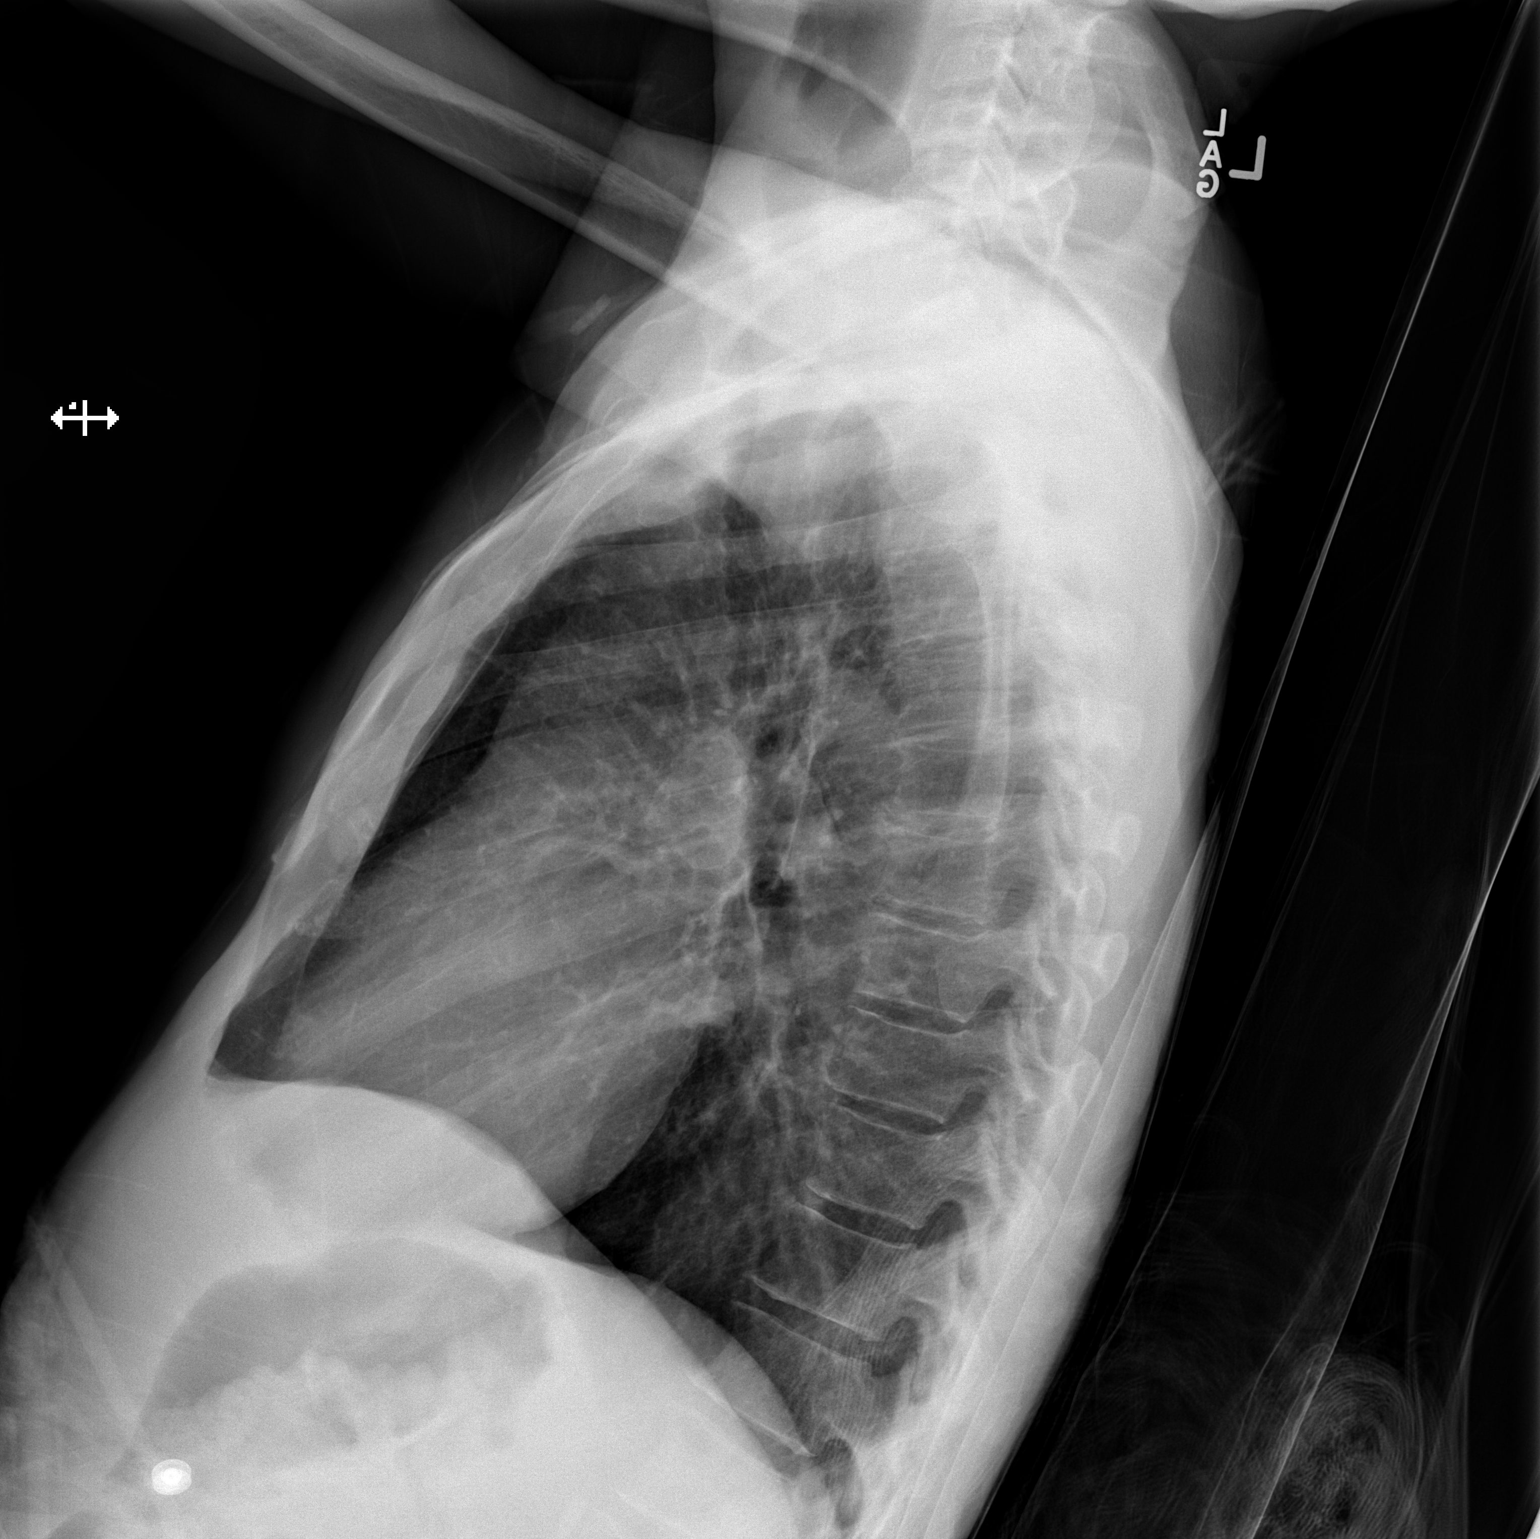

[x chest ap]
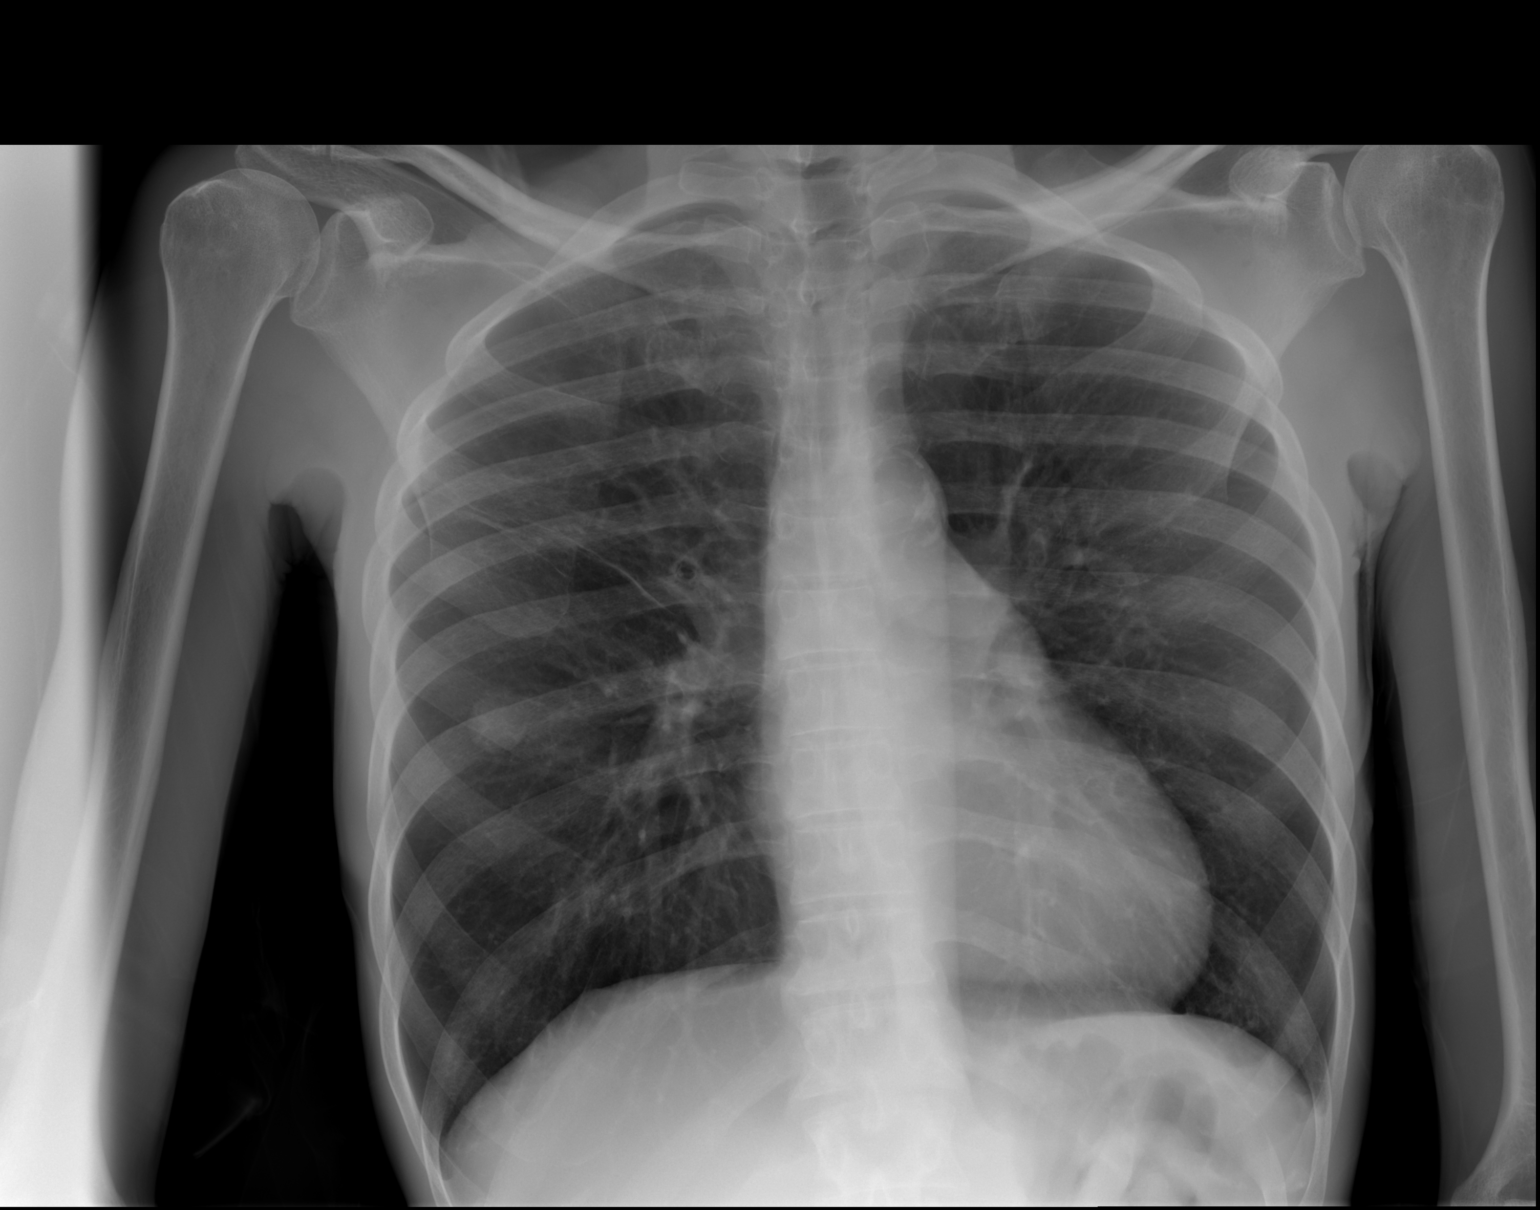

[2 of 2 positions shown; findings below may reference images not displayed]

FINDINGS: Normal cardiomediastinal contours. No pulmonary edema or focal
consolidation. No pleural effusion or pneumothorax. There are
bilateral round opacities overlying the mid thorax, which may be
nipple shadows.
IMPRESSION: 1. No active cardiopulmonary disease.
2. Bilateral round opacities of the mid chest, likely nipple
shadows. Repeat radiograph with nipple markers would be
confirmatory.

## 2019-04-16 ENCOUNTER — Emergency Department (HOSPITAL_COMMUNITY)
Admission: EM | Admit: 2019-04-16 | Discharge: 2019-04-16 | Disposition: A | Discharge status: Home / Self Care | Payer: Medicaid Other | Attending: Emergency Medicine | Admitting: Emergency Medicine

## 2019-04-16 ENCOUNTER — Encounter (HOSPITAL_COMMUNITY): Payer: Self-pay

## 2019-04-16 ENCOUNTER — Other Ambulatory Visit: Payer: Self-pay

## 2019-04-16 DIAGNOSIS — L309 Dermatitis, unspecified: Secondary | ICD-10-CM | POA: Insufficient documentation

## 2019-04-16 DIAGNOSIS — F17210 Nicotine dependence, cigarettes, uncomplicated: Secondary | ICD-10-CM | POA: Insufficient documentation

## 2019-04-16 DIAGNOSIS — Z79899 Other long term (current) drug therapy: Secondary | ICD-10-CM | POA: Insufficient documentation

## 2019-04-16 DIAGNOSIS — L739 Follicular disorder, unspecified: Secondary | ICD-10-CM | POA: Insufficient documentation

## 2019-04-16 DIAGNOSIS — R21 Rash and other nonspecific skin eruption: Secondary | ICD-10-CM | POA: Diagnosis present

## 2019-04-16 DIAGNOSIS — I10 Essential (primary) hypertension: Secondary | ICD-10-CM | POA: Insufficient documentation

## 2019-04-16 LAB — CBC WITH DIFFERENTIAL/PLATELET
Abs Immature Granulocytes: 0.01 10*3/uL (ref 0.00–0.07)
Basophils Absolute: 0 10*3/uL (ref 0.0–0.1)
Basophils Relative: 0 %
Eosinophils Absolute: 0.1 10*3/uL (ref 0.0–0.5)
Eosinophils Relative: 1 %
HCT: 32.1 % — ABNORMAL LOW (ref 36.0–46.0)
Hemoglobin: 10.7 g/dL — ABNORMAL LOW (ref 12.0–15.0)
Immature Granulocytes: 0 %
Lymphocytes Relative: 22 %
Lymphs Abs: 1.4 10*3/uL (ref 0.7–4.0)
MCH: 28.6 pg (ref 26.0–34.0)
MCHC: 33.3 g/dL (ref 30.0–36.0)
MCV: 85.8 fL (ref 80.0–100.0)
Monocytes Absolute: 0.4 10*3/uL (ref 0.1–1.0)
Monocytes Relative: 6 %
Neutro Abs: 4.5 10*3/uL (ref 1.7–7.7)
Neutrophils Relative %: 71 %
Platelets: 287 10*3/uL (ref 150–400)
RBC: 3.74 MIL/uL — ABNORMAL LOW (ref 3.87–5.11)
RDW: 15.9 % — ABNORMAL HIGH (ref 11.5–15.5)
WBC: 6.3 10*3/uL (ref 4.0–10.5)
nRBC: 0 % (ref 0.0–0.2)

## 2019-04-16 LAB — COMPREHENSIVE METABOLIC PANEL
ALT: 14 U/L (ref 0–44)
AST: 23 U/L (ref 15–41)
Albumin: 3.8 g/dL (ref 3.5–5.0)
Alkaline Phosphatase: 85 U/L (ref 38–126)
Anion gap: 12 (ref 5–15)
BUN: 19 mg/dL (ref 6–20)
CO2: 20 mmol/L — ABNORMAL LOW (ref 22–32)
Calcium: 9.1 mg/dL (ref 8.9–10.3)
Chloride: 106 mmol/L (ref 98–111)
Creatinine, Ser: 0.59 mg/dL (ref 0.44–1.00)
GFR calc Af Amer: >60 mL/min (ref >60)
GFR calc non Af Amer: >60 mL/min (ref >60)
Glucose, Bld: 98 mg/dL (ref 70–99)
Potassium: 3.5 mmol/L (ref 3.5–5.1)
Sodium: 138 mmol/L (ref 135–145)
Total Bilirubin: 0.7 mg/dL (ref 0.3–1.2)
Total Protein: 7.7 g/dL (ref 6.5–8.1)

## 2019-04-16 MED ORDER — PREDNISONE 20 MG PO TABS
60.0000 mg | ORAL_TABLET | Freq: Once | ORAL | Status: AC
  Administered 2019-04-16: 60 mg via ORAL
  Filled 2019-04-16: qty 3

## 2019-04-16 MED ORDER — PREDNISONE 10 MG PO TABS: 40.0000 mg | ORAL_TABLET | Freq: Every day | ORAL | 0 refills | Status: DC

## 2019-04-16 MED ORDER — HYDROMORPHONE HCL 2 MG PO TABS: 2.0000 mg | ORAL_TABLET | Freq: Four times a day (QID) | ORAL | 0 refills | Status: DC | PRN

## 2019-04-16 MED ORDER — DOXYCYCLINE HYCLATE 100 MG PO CAPS: 100.0000 mg | ORAL_CAPSULE | Freq: Two times a day (BID) | ORAL | 0 refills | Status: DC

## 2019-04-16 MED ORDER — DOXYCYCLINE HYCLATE 100 MG PO TABS
100.0000 mg | ORAL_TABLET | Freq: Once | ORAL | Status: AC
  Administered 2019-04-16: 100 mg via ORAL
  Filled 2019-04-16: qty 1

## 2019-04-16 MED ORDER — OXYCODONE HCL 5 MG PO TABS
5.0000 mg | ORAL_TABLET | Freq: Once | ORAL | Status: AC
  Administered 2019-04-16: 09:00:00 5 mg via ORAL
  Filled 2019-04-16: qty 1

## 2019-04-16 NOTE — ED Notes (Signed)
ED Provider at bedside. 

## 2019-04-16 NOTE — Progress Notes (Addendum)
CSW spoke to EDP who states that pt lives with others and used to be homeless.  Per EPD, pt's body is not in good shape despite being treated for a rash on a previous visit where pt was given the needed medications for the rash the rash continues to be a problem that has become extreme and pt has right arm pain which began 2 hours ago per the pt.  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  Per EDP pt states she has no PCP.  EDP will place a [facility] RN CM consult with an order for face-to-face so RN CM can follow up regarding PCP.  Per RN CM pt's Medici[employer] card should have pt's PCP listed as Medicaid only pays a PCP that is listed as pt's PCP and Medicaid must assist with pt changing PCP so new PCP's can seek payment.  Per pt's RN, pt lives with two family members, pt has stated.  CSW will continue to follow for D/C needs.  [name]. Azucena Dart, LCSW, LCAS, CSI Transitions of Care Clinical Social Worker Care Coordination Department Ph: [phone]

## 2019-04-16 NOTE — ED Notes (Signed)
Social Work is attempting to contact patient via bedside phone now.

## 2019-04-16 NOTE — ED Notes (Signed)
PTAR has arrived.

## 2019-04-16 NOTE — ED Provider Notes (Signed)
[facility] DEPT Provider Note   CSN: [id number] Arrival date & time: 04/16/19  7564     History   Chief Complaint Chief Complaint  Patient presents with  . Rash  . Right Arm Pain    HPI [name] is a 58 y.o. female.     Patient brought in by EMS.  Patient with complaint of rash painful rash under her right arm area.  Patient was seen in March for rash in the skin creases and axillary area.  Was treated then with a topical steroid antifungal as well as the antibiotic doxycycline.  Patient has some chronic rash to both arms.  Patient status post bilateral below the knee amputations.  Patient lives in a group home.  Was previously homeless but is been with a group home for a period of time and she is satisfied with her care there.  Had Education officer, museum evaluate situation.  Patient denies any fever or any other complaints.  Patient states she not been able to see her primary care doctor for the past several months due to the coronavirus pandemic.  Patient denies any fevers or any upper respiratory symptoms.     Past Medical History:  Diagnosis Date  . CAP (community acquired pneumonia) 03/2014 X 2  . Cocaine abuse (Uncertain)    ongoing with resultant vaculitis.  . Depression   . Headache    "weekly" (07/29/2016)  . Hypertension   . Inflammatory arthritis   . Migraines    "probably 5-6/yr" (07/29/2016)  . Normocytic anemia    BL Hgb 9.8-12. Last anemia panel 04/2010 - showing Fe 19, ferritin 101.  Pt on monthly B12 injections  . Rheumatoid arthritis(714.0)    patient reported  . S/P bilateral below knee amputation ([place]) 04/13/2017  . 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 Followed by Dr. Louanne Skye    Patient Active Problem List   Diagnosis Date Noted  . At risk for adverse drug event 08/30/2018  . Microcytic hypochromic anemia 08/30/2018  . Thrombocytopenia ([place]) 08/20/2018  . Toxic  encephalopathy 08/20/2018  . Goals of care, counseling/discussion   . Palliative care by specialist   . DNR (do not resuscitate) discussion   . Infiltrate noted on imaging study   . Ulcer of right thigh, limited to breakdown of skin ([place])   . S/P bilateral above knee amputation ([place])   . Homelessness 07/30/2018  . Elevated LFTs 07/30/2018  . Osteomyelitis ([place]) 07/30/2018  . Acute encephalopathy 07/30/2018  . Eye swelling, bilateral 07/30/2018  . Skin rash 07/11/2018  . Acute on chronic diastolic CHF (congestive heart failure) ([place]) 06/21/2018  . Malnutrition of moderate degree 05/20/2018  . Acute congestive heart failure ([place])   . Suicidal ideation 02/02/2018  . HCAP (healthcare-associated pneumonia) 02/02/2018  . Acute on chronic respiratory failure with hypoxia ([place]) 02/02/2018  . Sepsis ([place]) 02/02/2018  . Ulcer of amputation stump of lower extremity ([place]) 10/23/2017  . Cocaine abuse with cocaine-induced mood disorder ([place]) 08/10/2017  . Hypertensive crisis   . Phantom limb pain ([place])   . Tobacco abuse   . Post-operative pain   . Acute blood loss anemia   . Atherosclerosis of native arteries of extremities with gangrene, bilateral legs ([place])   . AKI (acute kidney injury) ([place]) 02/07/2017  . Acute kidney injury ([place]) 02/06/2017  . Wound healing, delayed   . Chest pain 04/07/2015  . Protein-calorie malnutrition, severe ([place])  01/15/2015  . MDD (major depressive disorder), recurrent episode, severe ([place]) 10/16/2014  . Cocaine use disorder, severe, dependence ([place]) 10/10/2014  . 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 Essential hypertension 02/26/2010    Past Surgical History:  Procedure Laterality Date  . AMPUTATION Left 05/22/2016   Procedure: AMPUTATION LEFT LONG FINGER;  Surgeon: Marybelle Killings, MD;  Location: [facility];  Service: Orthopedics;  Laterality: Left;  . AMPUTATION Bilateral 04/10/2017   Procedure: AMPUTATION BELOW KNEE;  Surgeon: Newt Minion, MD;   Location: [facility];  Service: Orthopedics;  Laterality: Bilateral;  . AMPUTATION Bilateral 02/06/2018   Procedure: AMPUTATION ABOVE KNEE;  Surgeon: Newt Minion, MD;  Location: [facility];  Service: Orthopedics;  Laterality: Bilateral;  . HERNIA REPAIR     "stomach"  . I&D EXTREMITY Right 09/26/2015   Procedure: IRRIGATION AND DEBRIDEMENT LEG WOUND  VAC PLACEMENT.;  Surgeon: Loel Lofty Dillingham, DO;  Location: [facility];  Service: Plastics;  Laterality: Right;  . INCISION AND DRAINAGE OF WOUND Bilateral 10/20/2016   Procedure: IRRIGATION AND DEBRIDEMENT WOUND BILATERAL;  Surgeon: Edrick Kins, DPM;  Location: [facility];  Service: Podiatry;  Laterality: Bilateral;  . IRRIGATION AND DEBRIDEMENT ABSCESS Bilateral 09/26/2013   Procedure: DEBRIDEMENT ULCERS BILATERAL THIGHS;  Surgeon: Gwenyth Ober, MD;  Location: [facility];  Service: General;  Laterality: Bilateral;  . SKIN BIOPSY Bilateral    shin nodules     OB History   No obstetric history on file.      Home Medications    Prior to Admission medications   Medication Sig Start Date End Date Taking? Authorizing Provider  clotrimazole-betamethasone (LOTRISONE) cream Apply to affected area 2 times daily prn Patient not taking: Reported on 04/16/2019 12/05/18   Orpah Greek, MD  doxycycline (VIBRAMYCIN) 100 MG capsule Take 1 capsule (100 mg total) by mouth 2 (two) times daily. Patient not taking: Reported on 04/16/2019 12/05/18   Orpah Greek, MD  doxycycline (VIBRAMYCIN) 100 MG capsule Take 1 capsule (100 mg total) by mouth 2 (two) times daily. 04/16/19   Fredia Sorrow, MD  HYDROmorphone (DILAUDID) 2 MG tablet Take 1 tablet (2 mg total) by mouth every 6 (six) hours as needed for severe pain. 04/16/19   Fredia Sorrow, MD  lidocaine (XYLOCAINE) 5 % ointment Apply 1 application topically 4 (four) times daily as needed. Pain Patient not taking: Reported on 04/16/2019 12/05/18   Orpah Greek, MD  predniSONE (DELTASONE) 10 MG tablet Take  4 tablets (40 mg total) by mouth daily. 04/16/19   Fredia Sorrow, MD    Family History Family History  Problem Relation Age of Onset  . Breast cancer Mother        Breast cancer  . Alcohol abuse Mother   . Colon cancer Maternal Aunt 66  . Alcohol abuse Father     Social History Social History   Tobacco Use  . Smoking status: Current Every Day Smoker    Packs/day: 0.12    Years: 38.00    Pack years: 4.56    Types: Cigarettes  . Smokeless tobacco: Never Used  . Tobacco comment: 2 A DAY  Substance Use Topics  . Alcohol use: No    Alcohol/week: 0.0 standard drinks  . Drug use: Yes    Types: "Crack" cocaine, Cocaine    Comment: Smoked crack 06/02/2017     Allergies   Levamisole, Acetaminophen, Haldol [haloperidol lactate], Lisinopril, and Toradol [ketorolac tromethamine]   Review of Systems Review of Systems  Constitutional: Negative for chills and fever.  HENT: Negative for congestion, rhinorrhea and sore throat.   Eyes: Negative for visual disturbance.  Respiratory: Negative for cough and shortness of breath.   Cardiovascular: Negative for chest pain and leg swelling.  Gastrointestinal: Negative for abdominal pain, diarrhea, nausea and vomiting.  Genitourinary: Negative for dysuria.  Musculoskeletal: Negative for back pain and neck pain.  Skin: Positive for rash.  Neurological: Negative for dizziness, light-headedness and headaches.  Hematological: Does not bruise/bleed easily.  Psychiatric/Behavioral: Negative for confusion.     Physical Exam Updated Vital Signs BP (!) 192/101   Pulse 100   Temp 98 F (36.7 C) (Oral)   Resp (!) 21   Ht 1.562 m (5' 1.5")   Wt 45.4 kg   SpO2 100%   BMI 18.59 kg/m   Physical Exam Vitals signs and nursing note reviewed.  Constitutional:      General: She is not in acute distress.    Appearance: Normal appearance. She is well-developed.  HENT:     Head: Normocephalic and atraumatic.  Eyes:     Extraocular  Movements: Extraocular movements intact.     Conjunctiva/sclera: Conjunctivae normal.     Pupils: Pupils are equal, round, and reactive to light.  Neck:     Musculoskeletal: Neck supple.  Cardiovascular:     Rate and Rhythm: Normal rate and regular rhythm.     Heart sounds: No murmur.  Pulmonary:     Effort: Pulmonary effort is normal. No respiratory distress.     Breath sounds: Normal breath sounds.  Abdominal:     Palpations: Abdomen is soft.     Tenderness: There is no abdominal tenderness.  Musculoskeletal:     Comments: Bilateral above-the-knee amputation.  Right stump with a excoriated wound that does not appear to be infected.  Skin:    General: Skin is warm and dry.     Findings: Lesion and rash present.     Comments: Patient with a folliculitis under the right axillary area and into the lateral part of the chest wall does not move around to the midline part of your back so I do not think this is a herpes zoster.  It is more consistent with a for colitis.  Does have some pustules.  Patient also has excoriated scabs and wounds to the forearms that appear old.  Patient also in the groin creases has a rash with skin breakdown without any secondary infection.  Neurological:     Mental Status: She is alert.      ED Treatments / Results  Labs (all labs ordered are listed, but only abnormal results are displayed) Labs Reviewed  COMPREHENSIVE METABOLIC PANEL - Abnormal; Notable for the following components:      Result Value   CO2 20 (*)    All other components within normal limits  CBC WITH DIFFERENTIAL/PLATELET - Abnormal; Notable for the following components:   RBC 3.74 (*)    Hemoglobin 10.7 (*)    HCT 32.1 (*)    RDW 15.9 (*)    All other components within normal limits    EKG None  Radiology No results found.  Procedures Procedures (including critical care time)  Medications Ordered in ED Medications  doxycycline (VIBRA-TABS) tablet 100 mg (100 mg Oral Given  04/16/19 0855)  oxyCODONE (Oxy IR/ROXICODONE) immediate release tablet 5 mg (5 mg Oral Given 04/16/19 0855)  predniSONE (DELTASONE) tablet 60 mg (60 mg Oral Given 04/16/19 0947)     Initial Impression /  Assessment and Plan / ED Course  I have reviewed the triage vital signs and the nursing notes.  Pertinent labs & imaging results that were available during my care of the patient were reviewed by me and considered in my medical decision making (see chart for details).        Patient's labs here today very reassuring.  Patient not febrile.  Exact etiology of the rash is not clear.  The new rash is more of a folliculitis type rash on the right axillary and right chest wall area.  Does not seem to be consistent with zoster since it does not spread around to the midline part of the back.  So it is not following a dermatomal distribution.  Patient treated here with doxycycline prednisone and some pain medication.  This will be continued.  Had social worker interview her home environment seems safe.  Seems to be good for her.  They also determined that she has been working towards getting electric wheelchair and has been having trouble getting that arranged.  Case manager will look into that.  Patient manager also help her get follow-up with her primary care doctor.  Patient will be continued with prednisone doxycycline and then was given a short course of hydromorphone orally.  Patient has an allergy to Tylenol.  Patient nontoxic no acute distress.  Final Clinical Impressions(s) / ED Diagnoses   Final diagnoses:  Dermatitis  Folliculitis    ED Discharge Orders         Ordered    predniSONE (DELTASONE) 10 MG tablet  Daily     04/16/19 1207    doxycycline (VIBRAMYCIN) 100 MG capsule  2 times daily,   Status:  Discontinued     04/16/19 1207    HYDROmorphone (DILAUDID) 2 MG tablet  Every 6 hours PRN     04/16/19 1207    doxycycline (VIBRAMYCIN) 100 MG capsule  2 times daily     04/16/19 1209            Fredia Sorrow, MD 04/16/19 1214

## 2019-04-16 NOTE — Progress Notes (Addendum)
CSW spoke to the pt who states she is not aware of where her family is and hasn't seen them in 25 years, as pt recounted in a tearful state.  Per the pt she was "shipped here from [place]" with her family to live in [place] and pt got in trouble and "did some time" and when pt was released (25 years ago) pt was unable to find family since.  Pt stated she met [name] when she was brought to [place] by the [facility] in [place], [place] who dropped pt off at the [employer] (25 years ago) and pt states she has been here ever since.  Pt heard she had a sister in [place] who was married and pt is unaware of pt's sister's married name.  Pt's sister Amariya Liskey in [place] is approx 63, was pt's sister's maiden name.  Pt stated she attempted to call her brother Emilea Goga 30 approx.  Pt stated she was sleeping under a bridge and was bitten by something that that crawled under her blanket she was under and she was infected and both of her legs "were cut off".  Pt stated she prayed to God to send someone who was like family and pt met McNair,[name] at ph: [phone] when a person suggested she talk to Mr. [name] who she called later after being at that "rehab place" at [facility].  Pt stated "Mr. [name] stated he would buy a house and rent the pt a room, which he did and pt has lived in that room ever since in a boarding house that Mr. [name] owns.  Pt stated she likes living there because everyone helps her "down the steps so she can go to the store and then" they "wait outside" for the pt to get home from the store so that they can help me back up the steps.  Pt states she pays $450 a moth for rent with electricity included but buys her own food.  Pt states she makes 7 something a month.  Pt states she "lives in the house with all men who look after me and make sure I'm okay".  Pt stated that Ailene Ards "is like my family, that's why I call him my brother".  Pt states she is trying to get an electric  chair through the doctor Dr. Ezzie Dural (unsure of spelling) "down there street from [employer]  Per the pt, Dr. Merlene Morse(?) has to measure the pt's legs before the pt can get the electric wheelchair from the [facility] store. Pt's doctor then would have to send the pt's measurements to [facility], per the pt before the pt's chair can be ordered.   Per the pt, a Dr. Andrey Spearman wrote her a prescription for an order for an electric wheelchair and that the pt "took to the store in person" at [facility] who told the pt that it would probably take three months to get a chair.  Pt stated she has been calling Dr. Sula Soda office and thought she wasn't getting any calls back, but got her phone fixed and found out that she had been missing the calls from the office and now can't get in touch with them.  Pt later found out from a friend that the office had been calling a friend's phone also but the friend's phone was inoperable for a while and the pt's friend only told the pt at a much later time that the office had been attempting to call  her.  Pt stated her phone had gotten wet from using her hands and arms to push her wheelchair through rainstorms and her and her phone had repeatedly gotten wet.  CSW will continue to follow for D/C needs.  Alphonse Guild. Aiven Kampe, LCSW, LCAS, CSI Transitions of Care Clinical Social Worker Care Coordination Department Ph: [phone]

## 2019-04-16 NOTE — ED Triage Notes (Signed)
Patient is AOx4 and non-ambulatory. Patient has double BKA. Patient called 911 due to chief complaint of rash on right arm with right arm pain. Unsure of when rash presented however right arm pain began roughly 2 hours ago. Patient has also run out of all of her medications that she takes on daily basis.

## 2019-04-16 NOTE — Discharge Instructions (Signed)
Take the prednisone as directed.  Take the doxycycline antibiotic as directed.  Make an appointment to follow back up with your primary care doctor to have the skin rash reevaluated.  Also case management will assist you in getting your electric wheelchair.  They should contact you in the next day or so.  Return for any new or worse symptoms.  Take the pain medication as directed.

## 2019-04-16 NOTE — Progress Notes (Signed)
Pt's landlord is Ailene Ards at [employer]   and he can reach the pt if the pt's phone becomes unusable or pt can not reached.  CSW confirmed pt #: [phone] is correct and address is correct:  [address]  [place] [address]  CSW looked up the name of the doctor Volney American at ph: [phone] who is a practitioner at:  [facility]  [address] [place], [place] [address]   CSW provided this to the pt who stated she has it and will attempt to follow up post-D/C.  TOC RN CM will leave a handoff for the [facility] RN CM on 7/13 to see if a follow up can be made to [place] to see if something can be done to assist the pt in this process or to insure pt is still "in the system" with [employer] and has or still has pt's prescription for an electric wheelchair which was, per the pt, provided by pt's Dr. Newt Minion who is with [facility].  Pt voiced understanding.  EDP updated.  Please reconsult if future social work needs arise.  CSW signing off, as social work intervention is no longer needed.  Alphonse Guild. Jennavie Martinek, LCSW, LCAS, CSI Transitions of Care Clinical Social Worker Care Coordination Department Ph: [phone]

## 2019-04-16 NOTE — ED Notes (Signed)
PT[facility] has been notified of patient requiring transportation.

## 2019-04-16 NOTE — ED Notes (Signed)
Urine Sample at bedside.

## 2019-04-18 NOTE — Progress Notes (Signed)
CM followed up on patients concerns about getting an electric wheelchair.  CM spoke with [employer] fulfillment department who stated that the patient has a file with them and they were having difficulty reaching patient. However, this has since been resolved as patient made contact approximately two weeks ago. At this time [facility] is waiting to see which clinic will have first available appointment to have patient come in and be evaluated and measure by therapy department. Once this is complete patient will be able to get her wheelchair.

## 2019-04-23 IMAGING — US US ABDOMEN COMPLETE
1 series · 13 of 25 positions shown · non-contrast
Comparison: Abdominal ultrasound July 30, 2018

CLINICAL DATA: Transaminitis

EXAM:
ABDOMEN ULTRASOUND COMPLETE

[Series 1: us abdomen complete · 0.20mm/px · 13 of 84 slices shown]
[im 1/84]
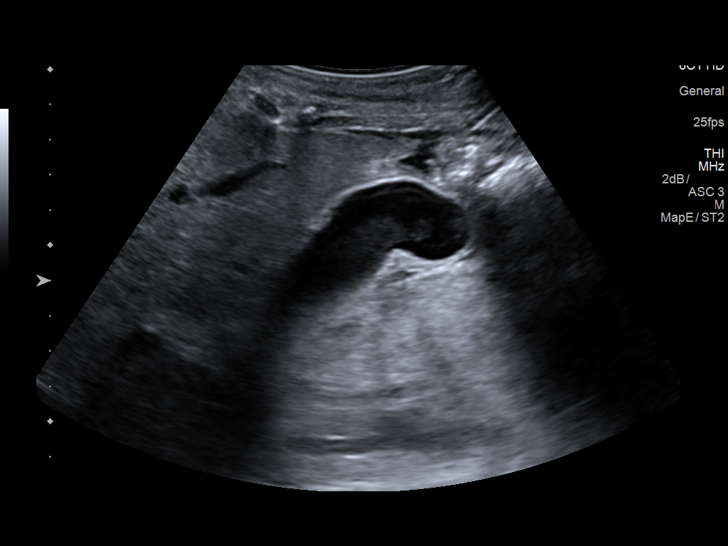
[im 7/84]
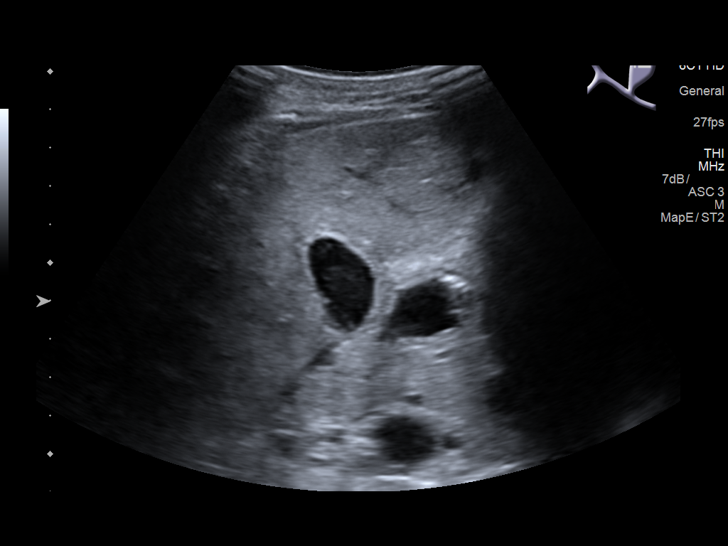
[im 14/84]
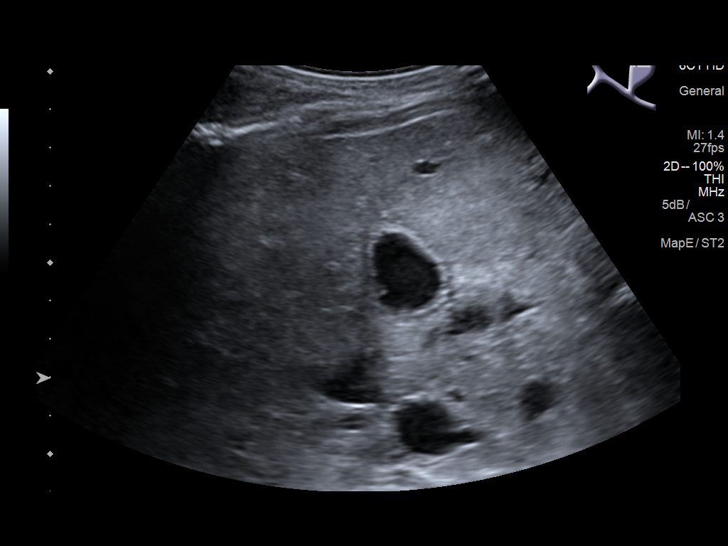
[im 21/84]
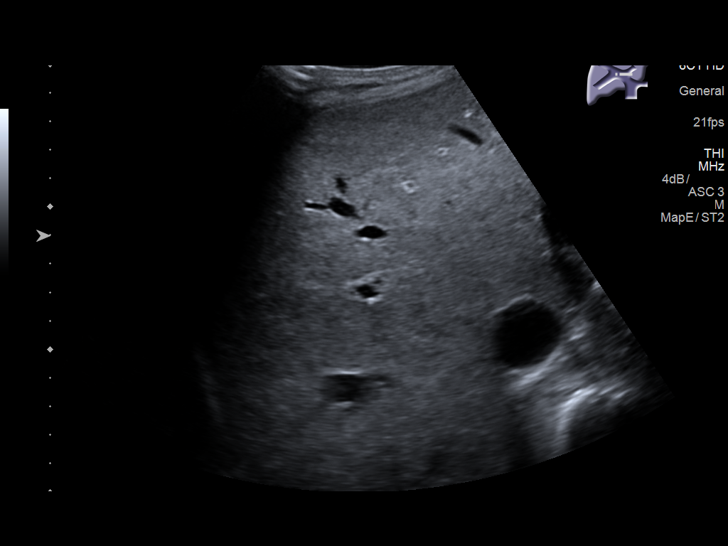
[im 28/84]
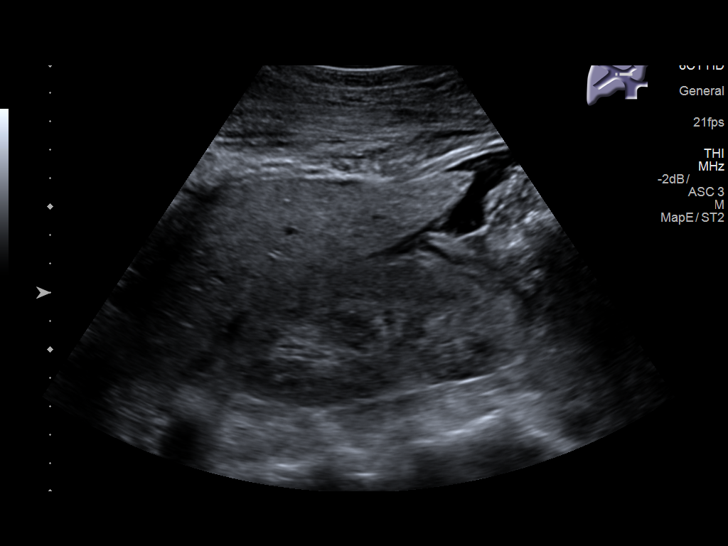
[im 35/84]
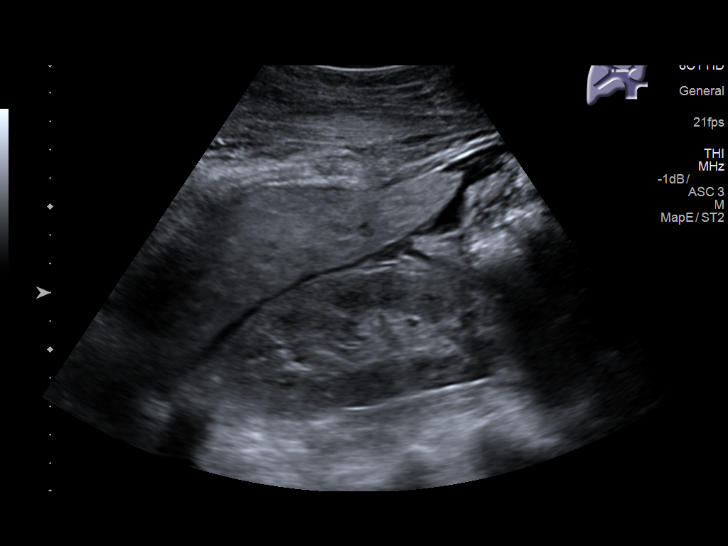
[im 42/84]
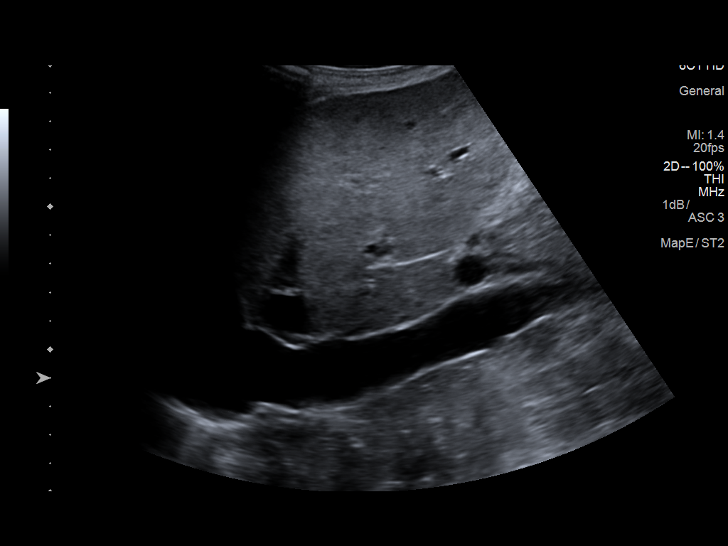
[im 49/84]
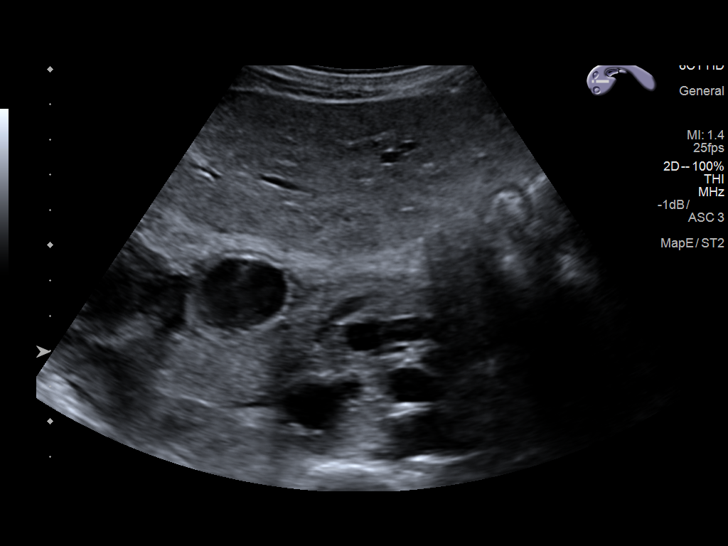
[im 56/84]
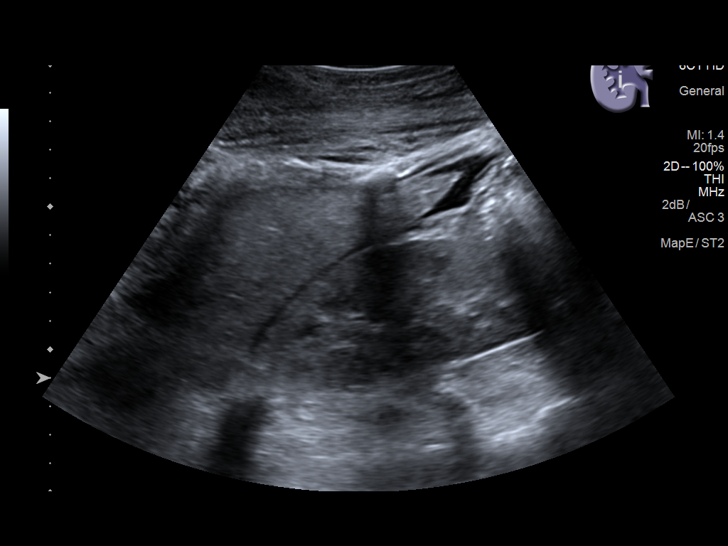
[im 63/84]
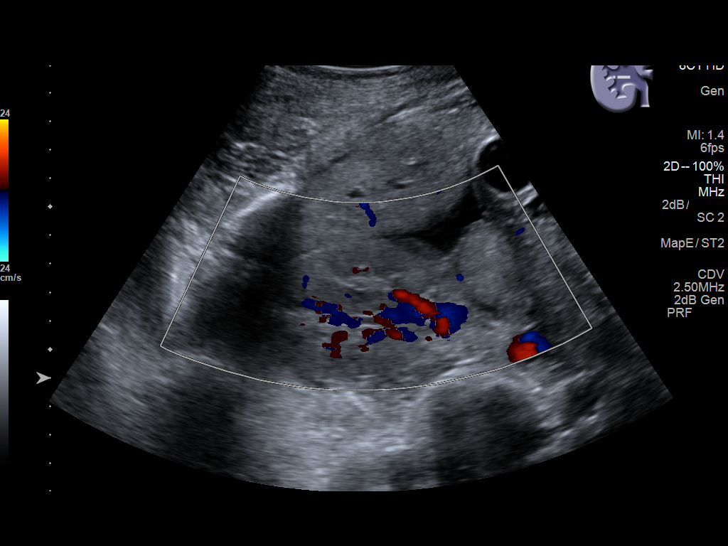
[im 70/84]
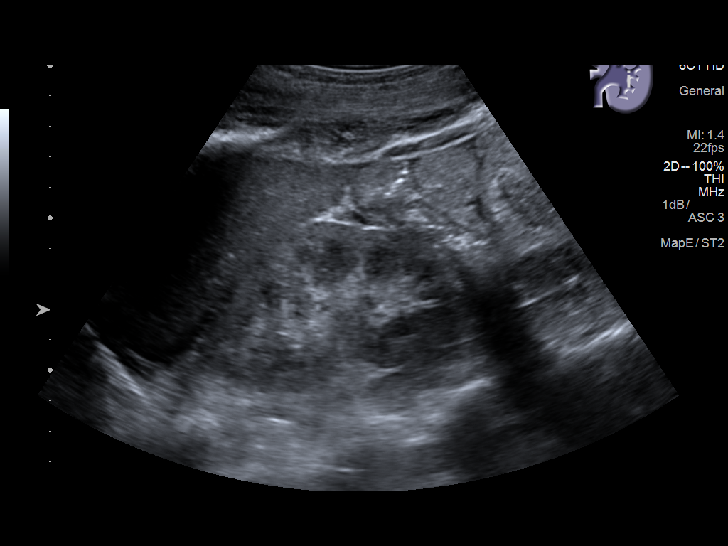
[im 77/84]
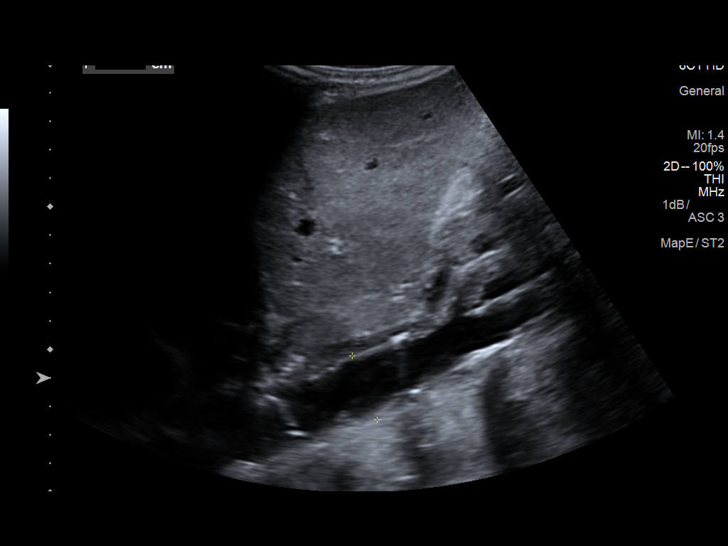
[im 84/84]
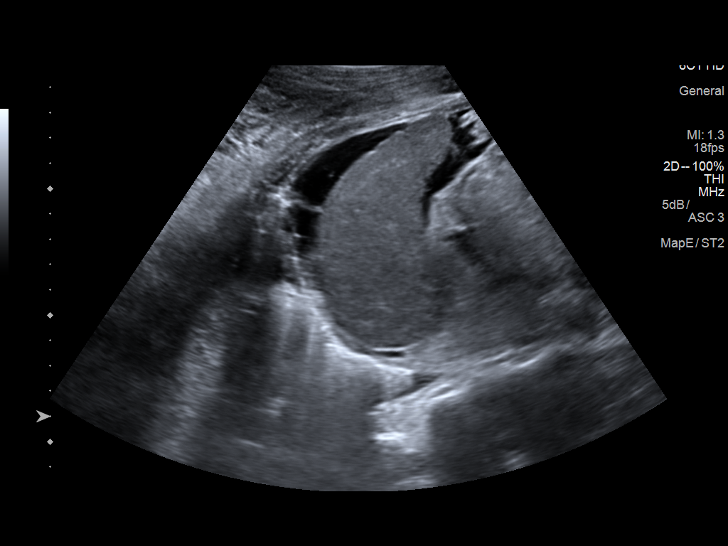

[13 of 25 positions shown; findings below may reference images not displayed]

FINDINGS: Gallbladder: The gallbladder is adequately distended. Echogenic bile
or sludge is present. There no discrete stones. There is no
gallbladder wall thickening or positive sonographic [name]'s sign. A
small amount of ascites is noted adjacent to the gallbladder.

Common bile duct: Diameter: 4 mm

Liver: The hepatic echotexture is heterogeneously increased. There
is no discrete mass or ductal dilation. A small amount of ascites
surrounds the liver. Portal vein is patent on color Doppler imaging
with bidirectional flow.

IVC: No abnormality visualized.

Pancreas: The pancreatic parenchyma is grossly normal. The duct is
dilated to 4 mm. No discrete pancreatic masses are observed.

Spleen: The spleen is normal in size and echotexture.

Right Kidney: Length: 11.2 cm. Echogenicity within normal limits. No
mass or hydronephrosis visualized.

Left Kidney: Length: 9.9 cm. Echogenicity within normal limits. No
mass or hydronephrosis visualized.

Abdominal aorta: There is no aortic aneurysm. There is mural plaque.

Other findings: There is ascites as well as bilateral pleural
effusions.
IMPRESSION: Increased hepatic echotexture without discrete mass. By directional
flow in the portal vein consistent with portal hypertension. Mild
dilation of the pancreatic duct 4 mm without evidence of pancreatic
masses.

Gallbladder sludge without discrete stones or sonographic evidence
of acute cholecystitis.

## 2019-04-25 ENCOUNTER — Ambulatory Visit: Payer: Self-pay | Admitting: Family Medicine

## 2019-05-12 ENCOUNTER — Ambulatory Visit: Payer: Self-pay | Admitting: Family Medicine

## 2019-05-20 IMAGING — DX DG CHEST 2V
3 series · 3 of 3 positions shown · non-contrast
Comparison: 04/08/2017

CLINICAL DATA: Pt complains of mid-sternal chest pain onset at 5315
today. Hx of HTN. Smoker

EXAM:
CHEST - 2 VIEW

[x chest ap]
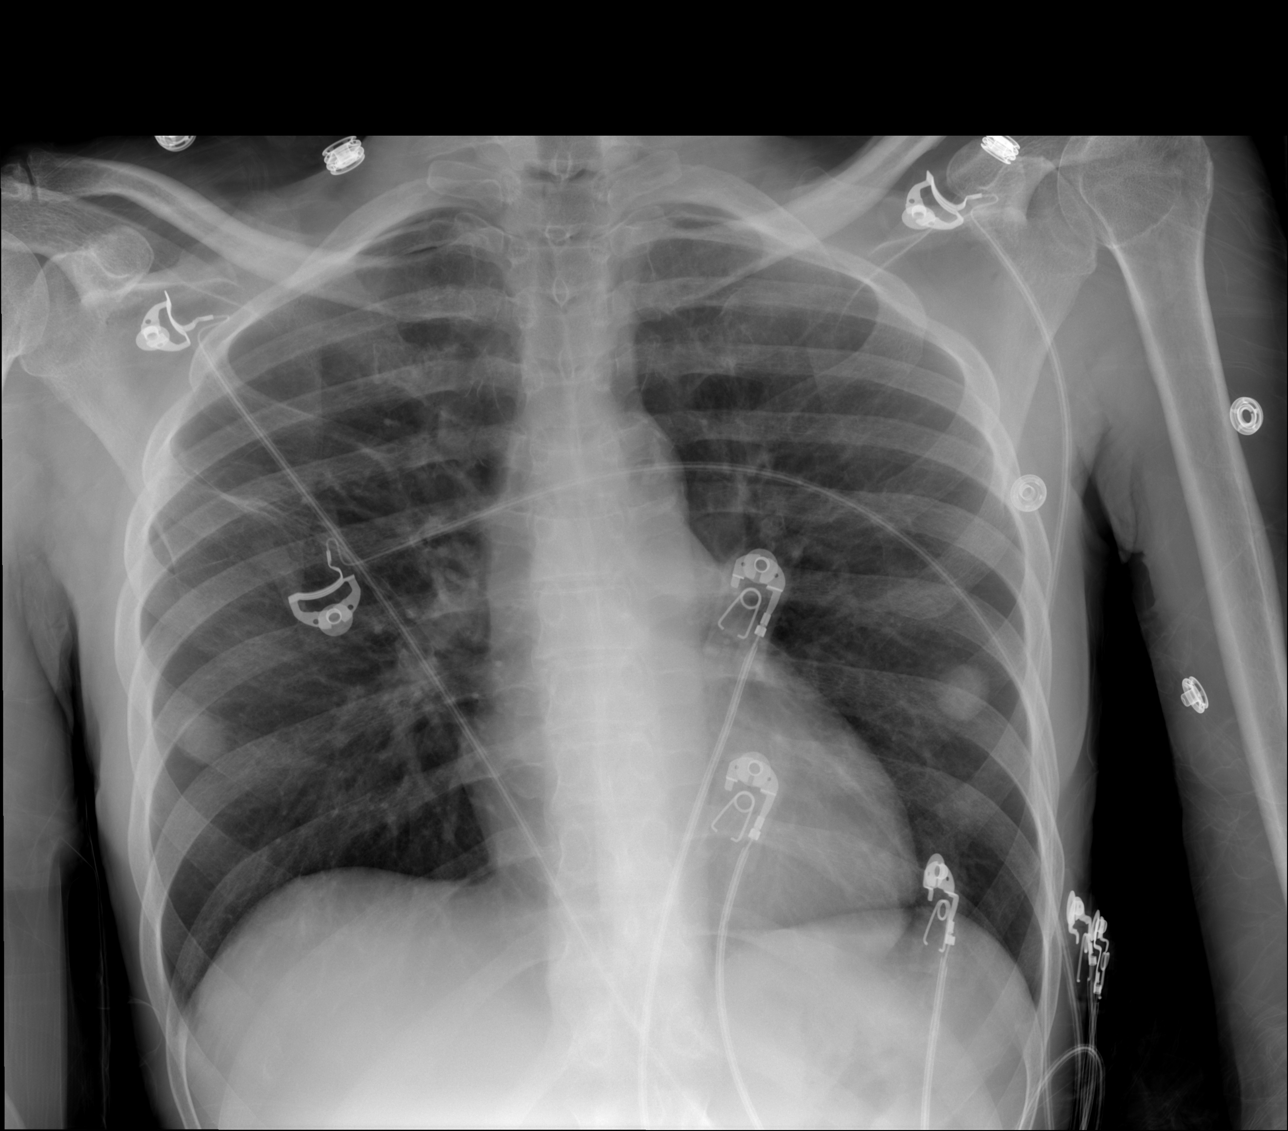

[w chest lat (1 of 2)]
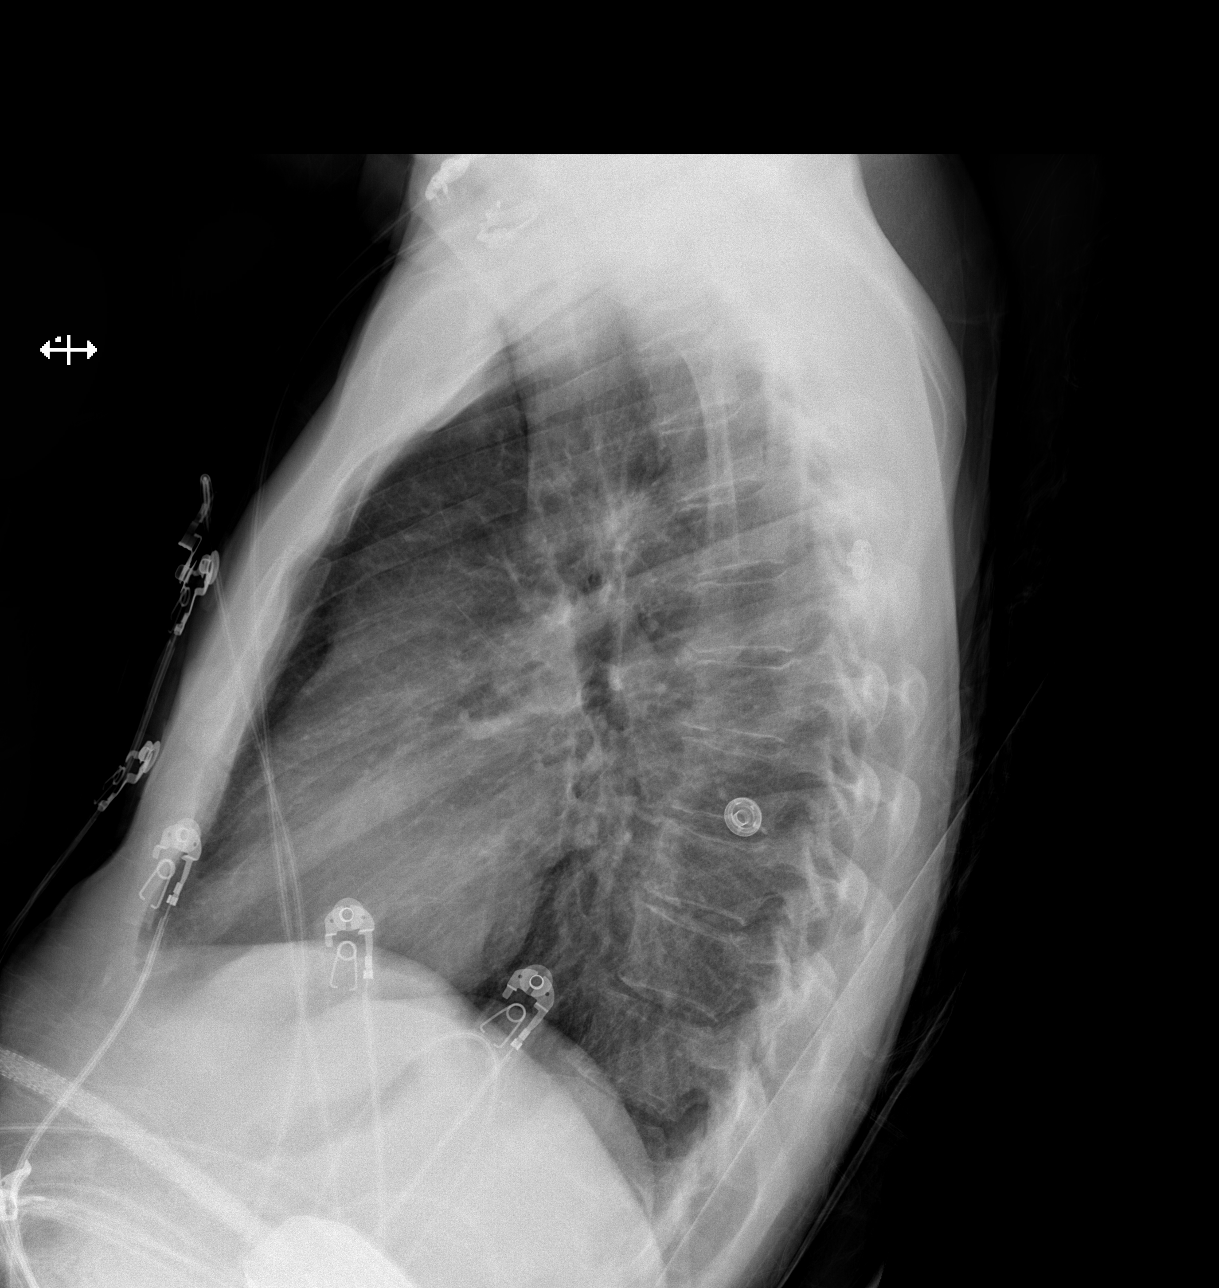

[w chest lat (2 of 2)]
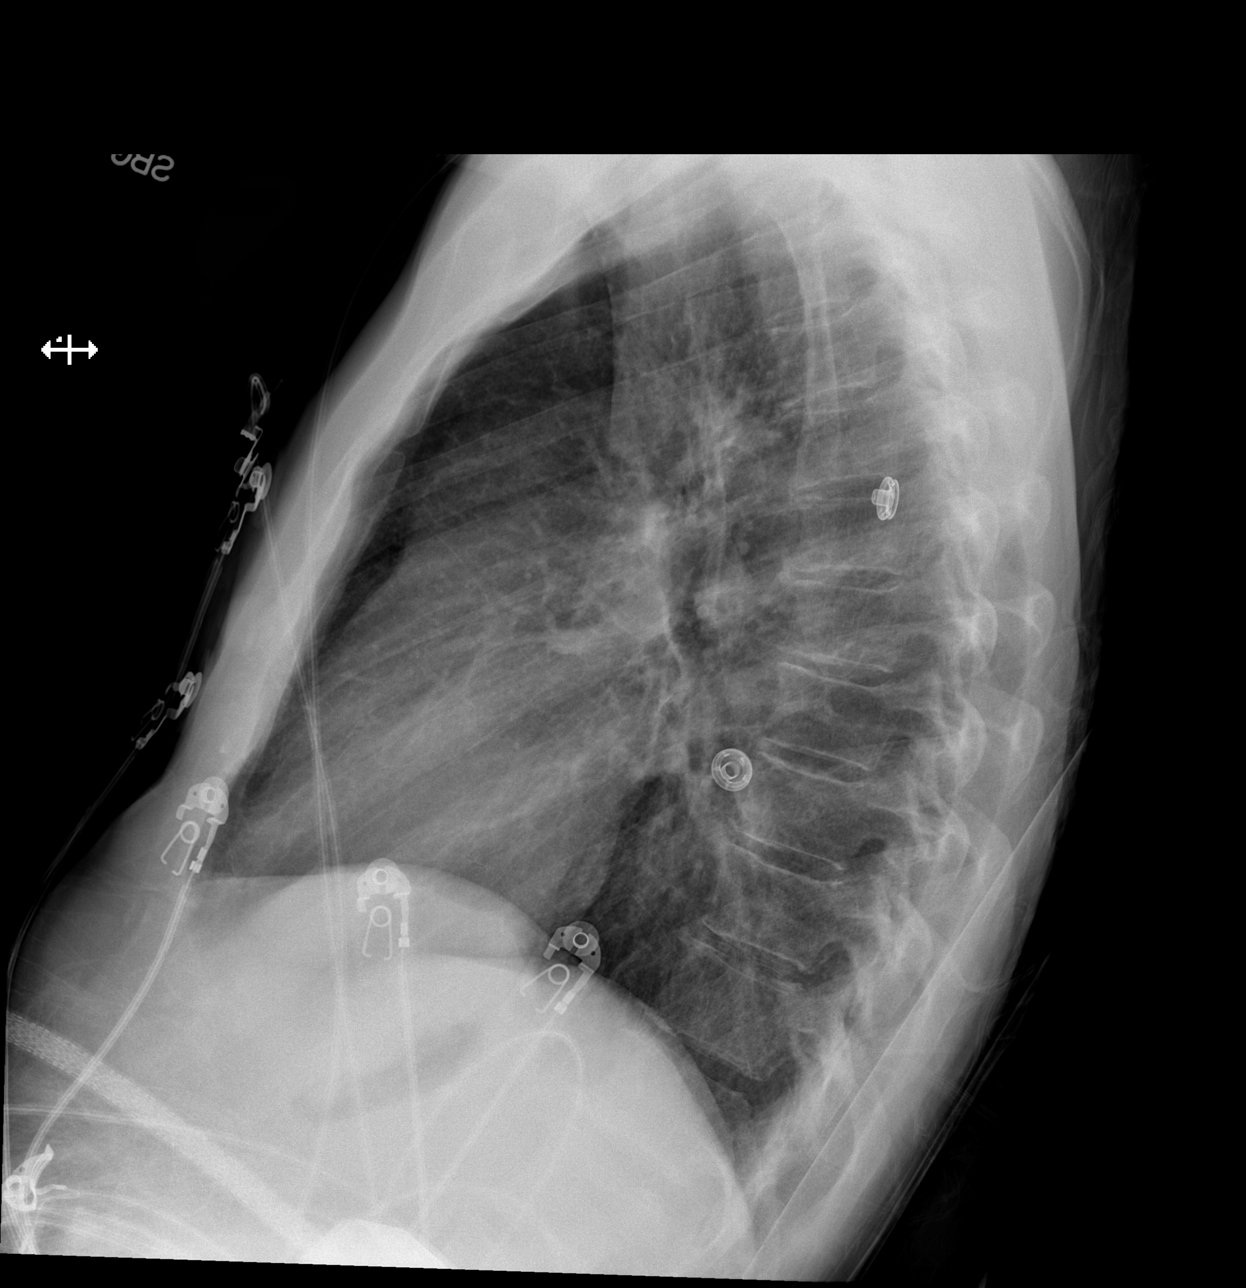

[3 of 3 positions shown; findings below may reference images not displayed]

FINDINGS: Stable linear scarring or subsegmental atelectasis in the right
upper lobe. Lungs are otherwise clear.

Heart size and mediastinal contours are within normal limits.

No effusion.

Visualized bones unremarkable.
IMPRESSION: No acute cardiopulmonary disease.

## 2019-05-20 IMAGING — CT CT ANGIO CHEST
2 of 6 series · 18 of 36 positions shown · IV contrast (isovue)
Comparison: CTA chest 10/12/2011.

CLINICAL DATA: Acute onset of left-sided chest pain that began at 1
o'clock a.m. this morning.

EXAM:
CT ANGIOGRAPHY CHEST WITH CONTRAST
TECHNIQUE: Multidetector CT imaging of the chest was performed using the
standard protocol during bolus administration of intravenous
contrast. Multiplanar CT image reconstructions and MIPs were
obtained to evaluate the vascular anatomy.
CONTRAST:  100 mL Isovue 370 IV.

[Series 7: thins · axial · 0.54mm/px · z∈[+953,+1180]mm · 17 of 363 slices shown]
[im 19/363  lung]
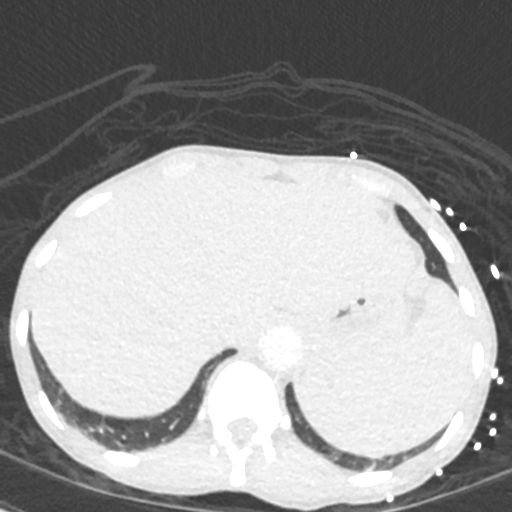
[im 37/363  mediastinal]
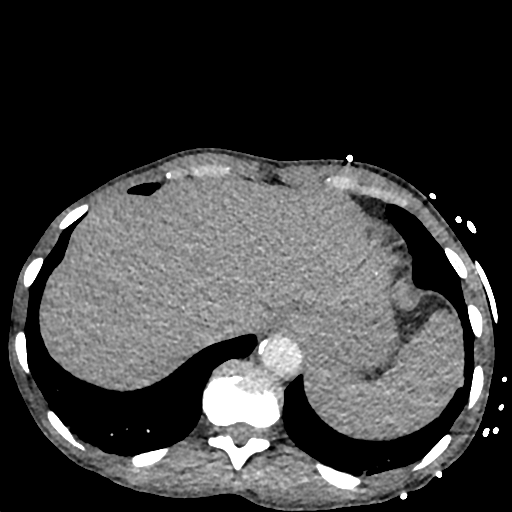
[im 55/363  lung]
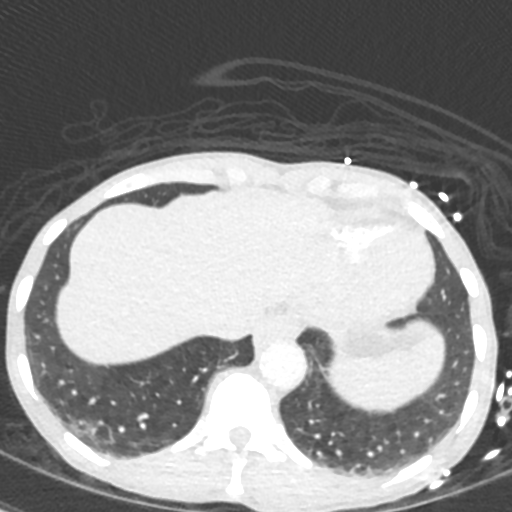
[im 73/363  mediastinal]
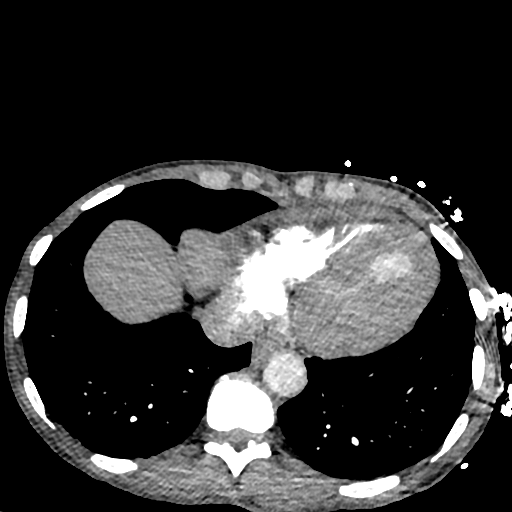
[im 109/363  lung]
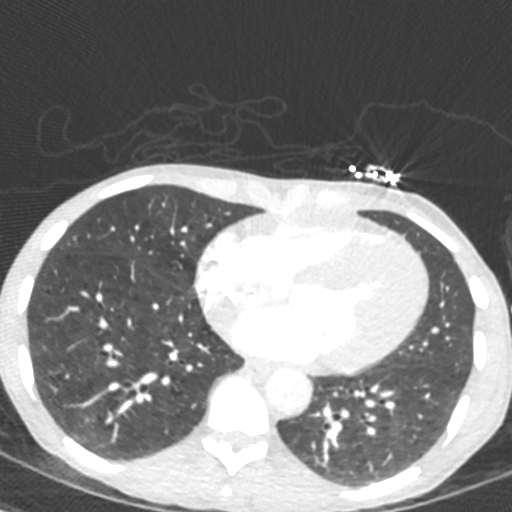
[im 127/363  mediastinal]
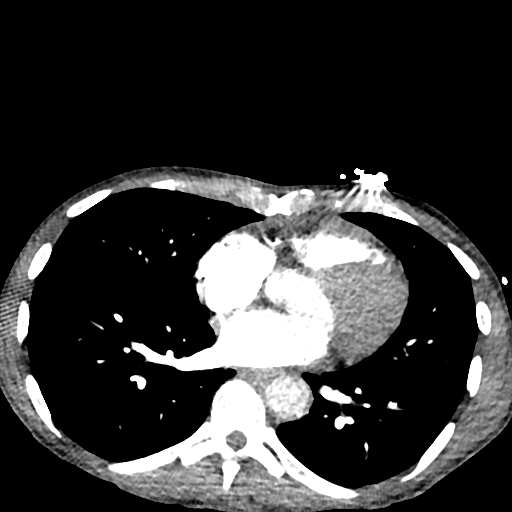
[im 145/363  lung]
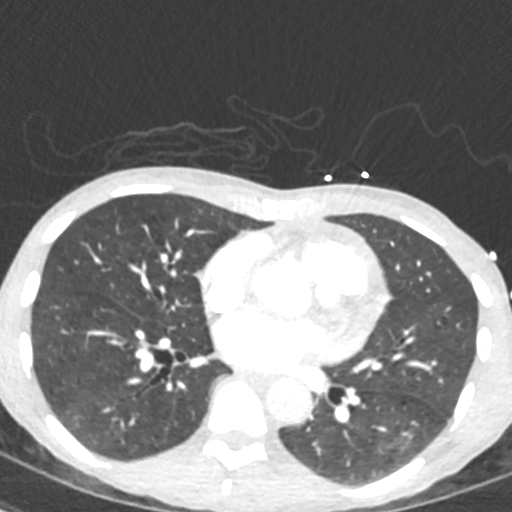
[im 163/363  mediastinal]
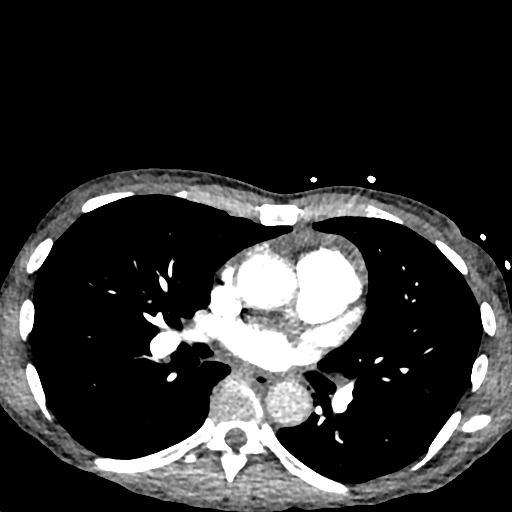
[im 182/363  lung]
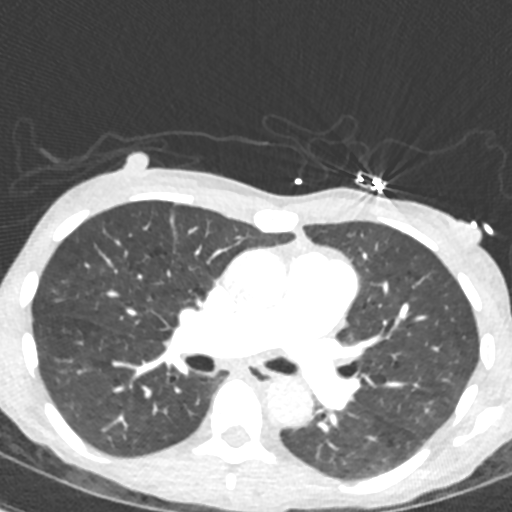
[im 200/363  mediastinal]
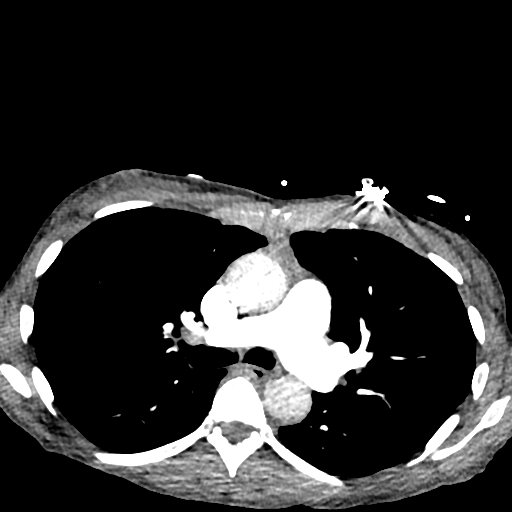
[im 218/363  lung]
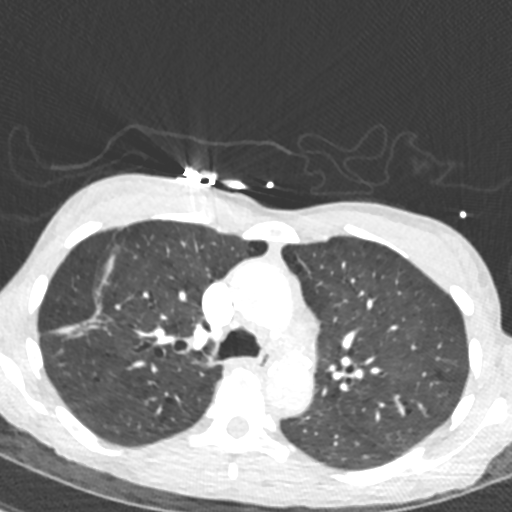
[im 236/363  mediastinal]
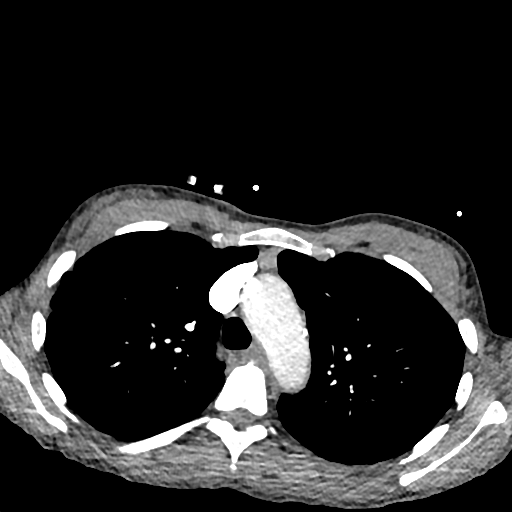
[im 254/363  lung]
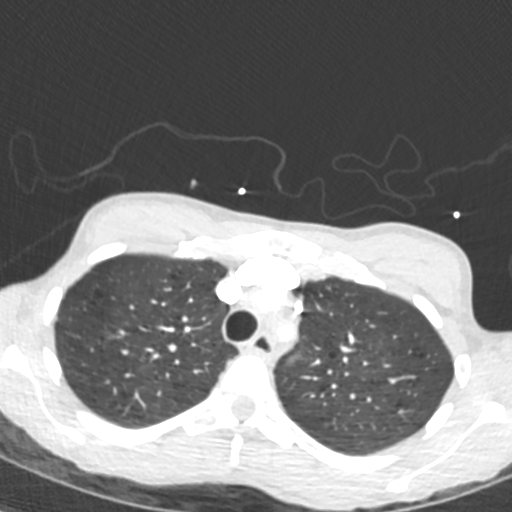
[im 290/363  mediastinal]
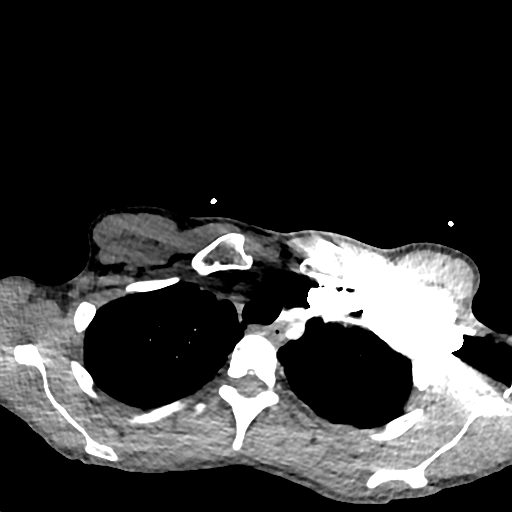
[im 308/363  lung]
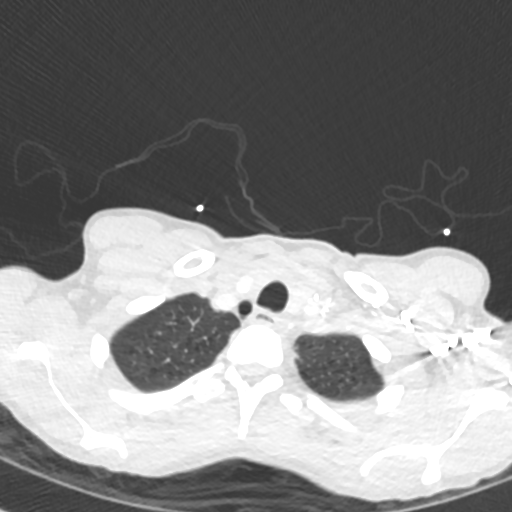
[im 326/363  mediastinal]
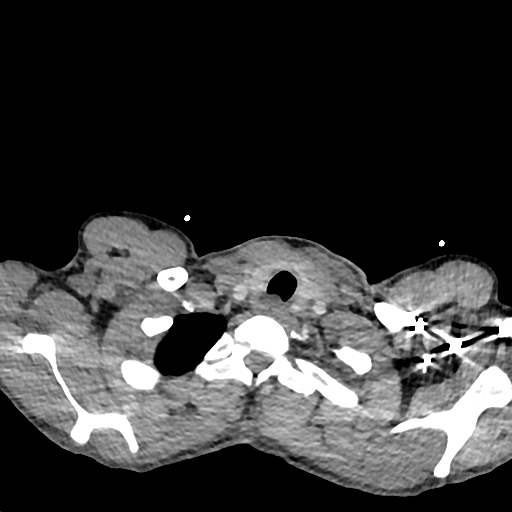
[im 344/363  lung]
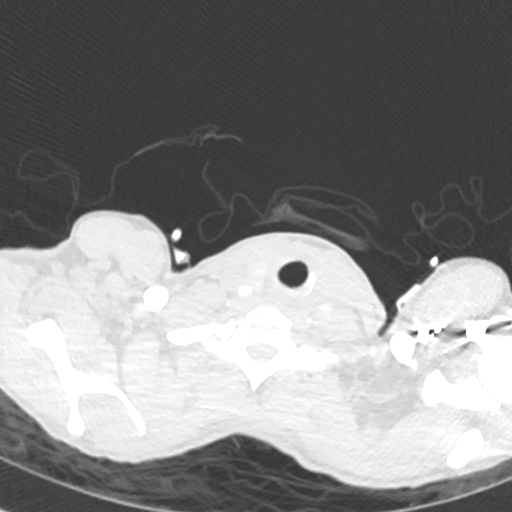

[Series 8: cor · coronal · 0.48mm/px · 1 of 101 slices shown]
[im 51/101  mediastinal]
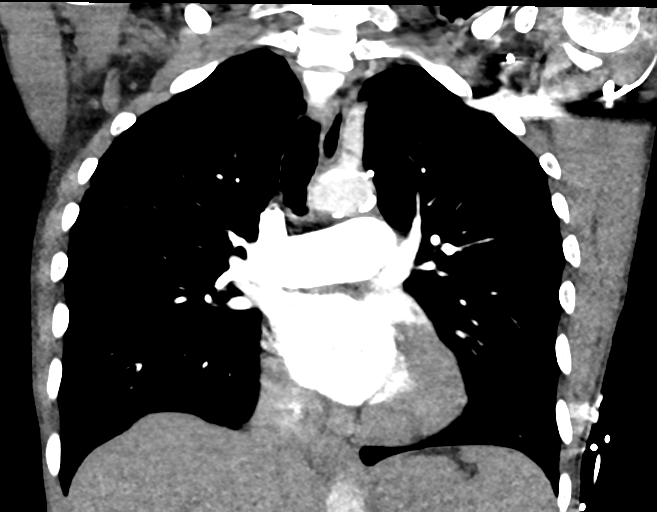

[18 of 36 positions shown; findings below may reference images not displayed]

FINDINGS: Cardiovascular: Excellent contrast opacification of the pulmonary
arteries. No filling defects within either main pulmonary artery or
their segmental branches in either lung to suggest pulmonary
embolism.

Heart size upper normal slightly enlarged with left ventricular
hypertrophy. Mild right coronary artery atherosclerosis. Small
pericardial effusion. Mild to moderate atherosclerosis involving the
thoracic and upper abdominal aorta without evidence of aneurysm.
Bovine aortic arch anatomy (left common carotid artery arises from
the innominate artery).

Mediastinum/Nodes: Upper normal size right hilar node measuring
approximately 1.4 cm. No pathologically enlarged mediastinal, hilar
or axillary lymph nodes. No mediastinal masses. Normal-appearing
esophagus. Thyroid gland unremarkable.

Lungs/Pleura: Emphysematous changes throughout both lungs scarring
involving the right upper lobe, unchanged. Lungs otherwise clear
without confluent airspace consolidation or interstitial disease. No
pulmonary parenchymal nodules or masses. No pleural effusions.
Central airways patent without significant bronchial wall
thickening.

Upper Abdomen: Unremarkable for the early arterial phase of
enhancement.

Musculoskeletal: Mild degenerative disc disease and spondylosis
involving the mid thoracic spine.

Review of the MIP images confirms the above findings.
IMPRESSION: 1. No evidence of pulmonary embolism.
2. COPD/emphysema. Right upper lobe scarring. No acute
cardiopulmonary disease.
3. Small pericardial effusion. Borderline cardiomegaly with left
ventricular hypertrophy and mild right coronary atherosclerosis.

Aortic Atherosclerosis (0RXEG-SJX.X) and Emphysema (0RXEG-0Y3.7).

## 2019-05-24 ENCOUNTER — Other Ambulatory Visit: Payer: Self-pay

## 2019-05-24 ENCOUNTER — Ambulatory Visit (INDEPENDENT_AMBULATORY_CARE_PROVIDER_SITE_OTHER): Payer: Medicaid Other | Admitting: Family Medicine

## 2019-05-24 ENCOUNTER — Encounter: Payer: Self-pay | Admitting: Family Medicine

## 2019-05-24 VITALS — BP 144/62 | HR 94 | Temp 97.9°F

## 2019-05-24 DIAGNOSIS — F418 Other specified anxiety disorders: Secondary | ICD-10-CM | POA: Diagnosis not present

## 2019-05-24 DIAGNOSIS — I10 Essential (primary) hypertension: Secondary | ICD-10-CM | POA: Diagnosis not present

## 2019-05-24 DIAGNOSIS — G546 Phantom limb syndrome with pain: Secondary | ICD-10-CM | POA: Diagnosis not present

## 2019-05-24 DIAGNOSIS — R262 Difficulty in walking, not elsewhere classified: Secondary | ICD-10-CM

## 2019-05-24 DIAGNOSIS — Z89511 Acquired absence of right leg below knee: Secondary | ICD-10-CM

## 2019-05-24 DIAGNOSIS — Z89512 Acquired absence of left leg below knee: Secondary | ICD-10-CM | POA: Diagnosis not present

## 2019-05-24 DIAGNOSIS — Z09 Encounter for follow-up examination after completed treatment for conditions other than malignant neoplasm: Secondary | ICD-10-CM

## 2019-05-24 MED ORDER — OXYCODONE HCL 5 MG PO TABS: 5.0000 mg | ORAL_TABLET | ORAL | 0 refills | Status: DC | PRN

## 2019-05-24 NOTE — Progress Notes (Signed)
Patient [name] and [name] Follow Up  Subjective:  [name], female    DOB: September 05, 1961  Age: 58 y.o. 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     HPI [name] is a 58 year old female presents for follow up today.   Past Medical History:  Diagnosis Date   CAP (community acquired pneumonia) 03/2014 X 2   Cocaine abuse ([place])    ongoing with resultant vaculitis.   Depression    Headache    "weekly" (07/29/2016)   Hypertension    Inflammatory arthritis    Migraines    "probably 5-6/yr" (07/29/2016)   Normocytic anemia    BL Hgb 9.8-12. Last anemia panel 04/2010 - showing Fe 19, ferritin 101.  Pt on monthly B12 injections   Rheumatoid arthritis(714.0)    patient reported   S/P bilateral below knee amputation (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 Followed by Dr. Louanne Skye   Current Status: Since her last office visit, she has had several ED visits for Skin Problems on 12/05/2018 and 04/16/2019. Today, she is doing well with no complaints. She had has chronic pain her area for bilateral amputated legs. She is currently in the process of receiving full disability and a an IT trainer wheelchair. She denies fevers, chills, fatigue, recent infections, weight loss, and night sweats. She has moderate anxiety, r/t ambulation problems. She denies suicidal ideations, homicidal ideations, or auditory hallucinations. No reports of GI problems such as diarrhea, and constipation. She has no reports of blood in stools, dysuria and hematuria. She denies pain today.   Past Surgical History:  Procedure Laterality Date   AMPUTATION Left 05/22/2016   Procedure: AMPUTATION LEFT LONG FINGER;  Surgeon: Marybelle Killings, MD;  Location: [facility];  Service: Orthopedics;   Laterality: Left;   AMPUTATION Bilateral 04/10/2017   Procedure: AMPUTATION BELOW KNEE;  Surgeon: Newt Minion, MD;  Location: [facility];  Service: Orthopedics;  Laterality: Bilateral;   AMPUTATION Bilateral 02/06/2018   Procedure: AMPUTATION ABOVE KNEE;  Surgeon: Newt Minion, MD;  Location: [facility];  Service: Orthopedics;  Laterality: Bilateral;   HERNIA REPAIR     "stomach"   I&D EXTREMITY Right 09/26/2015   Procedure: IRRIGATION AND DEBRIDEMENT LEG WOUND  VAC PLACEMENT.;  Surgeon: Loel Lofty Dillingham, DO;  Location: [facility];  Service: Plastics;  Laterality: Right;   INCISION AND DRAINAGE OF WOUND Bilateral 10/20/2016   Procedure: IRRIGATION AND DEBRIDEMENT WOUND BILATERAL;  Surgeon: Edrick Kins, DPM;  Location: [facility];  Service: Podiatry;  Laterality: Bilateral;   IRRIGATION AND DEBRIDEMENT ABSCESS Bilateral 09/26/2013   Procedure: DEBRIDEMENT ULCERS BILATERAL THIGHS;  Surgeon: Gwenyth Ober, MD;  Location: [facility];  Service: General;  Laterality: Bilateral;   SKIN BIOPSY Bilateral    shin nodules    Family History  Problem Relation Age of Onset   Breast cancer Mother        Breast cancer   Alcohol abuse Mother    Colon cancer Maternal Aunt 25   Alcohol abuse Father     Social History   Socioeconomic History   Marital status: Single    Spouse name: Not on file   Number of children: 0   Years  of education: 11th grade   Highest education level: Not on file  Occupational History   Occupation: Disability    Comment: since 2011, due to her rheumatoid arthritis  Social Designer, fashion/clothing strain: Not very hard   Food insecurity    Worry: Sometimes true    Inability: Sometimes true   Transportation needs    Medical: Yes    Non-medical: Yes  Tobacco Use   Smoking status: Current Every Day Smoker    Packs/day: 0.12    Years: 38.00    Pack years: 4.56    Types: Cigarettes   Smokeless tobacco: Never Used   Tobacco comment: 2 A DAY  Substance and Sexual  Activity   Alcohol use: No    Alcohol/week: 0.0 standard drinks   Drug use: Yes    Types: "Crack" cocaine, Cocaine    Comment: Smoked crack 06/02/2017   Sexual activity: Not Currently  Lifestyle   Physical activity    Days per week: 7 days    Minutes per session: Not on file   Stress: Very much  Relationships   Social connections    Talks on phone: More than three times a week    Gets together: More than three times a week    Attends religious service: Never    Active member of club or organization: No    Attends meetings of clubs or organizations: Never    Relationship status: Never married   Intimate partner violence    Fear of current or ex partner: Patient refused    Emotionally abused: Patient refused    Physically abused: Patient refused    Forced sexual activity: Patient refused  Other Topics Concern   Not on file  Social History Narrative   Unemployed:  cleaning in past   Living at [place]; has Medicaid.   crack/cocaine use; pt denies IVDU   tobacco:  1/2 ppd, trying to quit   alcohol:  none        Outpatient Medications Prior to Visit  Medication Sig Dispense Refill   clotrimazole-betamethasone (LOTRISONE) cream Apply to affected area 2 times daily prn 45 g 0   doxycycline (VIBRAMYCIN) 100 MG capsule Take 1 capsule (100 mg total) by mouth 2 (two) times daily. 14 capsule 0   lidocaine (XYLOCAINE) 5 % ointment Apply 1 application topically 4 (four) times daily as needed. Pain 35.44 g 0   predniSONE (DELTASONE) 10 MG tablet Take 4 tablets (40 mg total) by mouth daily. 20 tablet 0   doxycycline (VIBRAMYCIN) 100 MG capsule Take 1 capsule (100 mg total) by mouth 2 (two) times daily. 20 capsule 0   HYDROmorphone (DILAUDID) 2 MG tablet Take 1 tablet (2 mg total) by mouth every 6 (six) hours as needed for severe pain. 10 tablet 0   No facility-administered medications prior to visit.     Allergies  Allergen Reactions   Levamisole     Levamisole  is a chemical adulterant of cocaine which can cause cutaneous necrosis It is used as an Health and safety inspector in veterinary [name]   Acetaminophen Swelling and Other (See Comments)    Reaction:  Eyelid swelling   Haldol [Haloperidol Lactate]     Possible eye swelling was given together with Toradol unsure if caused the reaction   Lisinopril     UNSPECIFIED REACTION    Toradol [Ketorolac Tromethamine]     Eye swelling    ROS Review of Systems  Constitutional: Negative.   HENT: Negative.  Eyes: Negative.   Respiratory: Negative.   Cardiovascular: Negative.   Gastrointestinal: Negative.   Endocrine: Negative.   Genitourinary: Negative.   Musculoskeletal:       Recent bilateral AKAs. Well-healed scars noted.   Skin: Negative.   Allergic/Immunologic: Negative.   Neurological: Positive for dizziness (occasional) and headaches (occasional ).  Hematological: Negative.   Psychiatric/Behavioral: Negative.       Objective:    Physical Exam  Constitutional: She is oriented to person, place, and time. She appears well-developed and well-nourished.  HENT:  Head: Normocephalic and atraumatic.  Eyes: Conjunctivae are normal.  Neck: Normal range of motion. Neck supple.  Cardiovascular: Normal rate, regular rhythm, normal heart sounds and intact distal pulses.  Pulmonary/Chest: Effort normal and breath sounds normal.  Abdominal: Soft. Bowel sounds are normal.  Musculoskeletal: Normal range of motion.  Neurological: She is alert and oriented to person, place, and time. She has normal reflexes.  Skin: Skin is warm and dry.  Psychiatric: She has a normal mood and affect. Her behavior is normal. Judgment and thought content normal.  Vitals reviewed.   BP (!) 144/62 (BP Location: Right Arm, Patient Position: Sitting, Cuff Size: Small)    Pulse 94    Temp 97.9 F (36.6 C) (Oral)  Wt Readings from Last 3 Encounters:  04/16/19 100 lb (45.4 kg)  10/12/18 79 lb 9.6 oz (36.1 kg)    09/22/18 77 lb (34.9 kg)   Health Maintenance Due  Topic Date Due   COLONOSCOPY  04/17/2013   PAP SMEAR-Modifier  09/18/2013   INFLUENZA VACCINE  05/06/2019    There are no preventive care reminders to display for this patient.  Lab Results  Component Value Date   TSH 1.650 07/31/2018   Lab Results  Component Value Date   WBC 6.3 04/16/2019   HGB 10.7 (L) 04/16/2019   HCT 32.1 (L) 04/16/2019   MCV 85.8 04/16/2019   PLT 287 04/16/2019   Lab Results  Component Value Date   NA 138 04/16/2019   K 3.5 04/16/2019   CO2 20 (L) 04/16/2019   GLUCOSE 98 04/16/2019   BUN 19 04/16/2019   CREATININE 0.59 04/16/2019   BILITOT 0.7 04/16/2019   ALKPHOS 85 04/16/2019   AST 23 04/16/2019   ALT 14 04/16/2019   PROT 7.7 04/16/2019   ALBUMIN 3.8 04/16/2019   CALCIUM 9.1 04/16/2019   ANIONGAP 12 04/16/2019   Lab Results  Component Value Date   CHOL 164 05/20/2018   Lab Results  Component Value Date   HDL 40 (L) 05/20/2018   Lab Results  Component Value Date   LDLCALC 116 (H) 05/20/2018   Lab Results  Component Value Date   TRIG 40 05/20/2018   Lab Results  Component Value Date   CHOLHDL 4.1 05/20/2018   Lab Results  Component Value Date   HGBA1C 5.0 05/30/2018      Assessment & Plan:   1. Essential hypertension The current medical regimen is effective; blood pressure is stable at 144/62 today; continue present plan and medications as prescribed. She will continue to decrease high sodium intake, excessive alcohol intake, increase potassium intake, smoking cessation, and increase physical activity of at least 30 minutes of cardio activity daily. She will continue to follow Heart Healthy or DASH diet.  2. S/P bilateral below knee amputation (HCC) - oxyCODONE (OXY IR/ROXICODONE) 5 MG immediate release tablet; Take 1 tablet (5 mg total) by mouth every 4 (four) hours as needed for severe pain.  Dispense:  30 tablet; Refill: 0  3. Phantom limb pain (HCC) Mild.   4.  Depression with anxiety Mild.   5. Disability of walking Bilateral AKA.   6. Follow up She will follow up in 3 months.   Meds ordered this encounter  Medications   oxyCODONE (OXY IR/ROXICODONE) 5 MG immediate release tablet    Sig: Take 1 tablet (5 mg total) by mouth every 4 (four) hours as needed for severe pain.    Dispense:  30 tablet    Refill:  0    Order Specific Question:   Supervising Provider    Answer:   Tresa Garter [id number]   No orders of the defined types were placed in this encounter.   Referral Orders  No referral(s) requested today    Kathe Becton,  MSN, FNP-BC [place], [place] [address] [phone]- fax  Problem List Items Addressed This Visit      Cardiovascular and Mediastinum   Essential hypertension - Primary (Chronic)     Nervous and Auditory   Phantom limb pain ([place])    Other Visit Diagnoses    S/P bilateral below knee amputation ([employer])       Relevant Medications   oxyCODONE (OXY IR/ROXICODONE) 5 MG immediate release tablet   Depression with anxiety       Disability of walking       Follow up          Meds ordered this encounter  Medications   oxyCODONE (OXY IR/ROXICODONE) 5 MG immediate release tablet    Sig: Take 1 tablet (5 mg total) by mouth every 4 (four) hours as needed for severe pain.    Dispense:  30 tablet    Refill:  0    Order Specific Question:   Supervising Provider    Answer:   Tresa Garter [id number]    Follow-up: Return in about 3 months (around 08/24/2019).    Azzie Glatter, FNP

## 2019-05-28 DIAGNOSIS — R262 Difficulty in walking, not elsewhere classified: Secondary | ICD-10-CM | POA: Insufficient documentation

## 2019-06-06 ENCOUNTER — Telehealth: Payer: Self-pay | Admitting: Clinical

## 2019-06-06 NOTE — Telephone Encounter (Signed)
[facility] Referral Note  Reason for Referral: EREKA BRAU is a 58 y.o. female  Pt was referred by NP, Kathe Becton for: disability application assistance   Pt reports the following concerns: has partial disability benefits but medical condition has changed (recent BKA) and would like to apply for full benefits  Plan: 1. Addressed today: CSW called patient to assess. Patient has contacted the [place] [place] office, who indicated that her DSS caseworker should coordinate with them regarding additional paperwork. Her DSS caseworker is following up.  Patient also in need of SNAP benefits given her limited income. Patient indicated she has internet access and is able to apply for benefits online. Emailed patient instructions for applying online. Advised patient to reach out to CSW for assistance with the application as needed.  2. Referral: none at this time  3. Follow up: Will coordinate with patient's DSS case worker as needed to obtain necessary paperwork for SSD   Estanislado Emms, [facility] [phone]

## 2019-06-18 IMAGING — CT CT ANGIO CHEST
2 of 7 series · 19 of 46 positions shown · IV contrast (APPLIED)
Comparison: Radiograph earlier this day. Chest CT PE protocol 4
weeks prior

CLINICAL DATA: Chest pain.

EXAM:
CT ANGIOGRAPHY CHEST WITH CONTRAST
TECHNIQUE: Multidetector CT imaging of the chest was performed using the
standard protocol during bolus administration of intravenous
contrast. Multiplanar CT image reconstructions and MIPs were
obtained to evaluate the vascular anatomy.
CONTRAST:  70 cc Isovue 370 IV

[Series 7: thins · axial · 0.55mm/px · z∈[-264,-22]mm · 16 of 389 slices shown]
[im 22/389  lung]
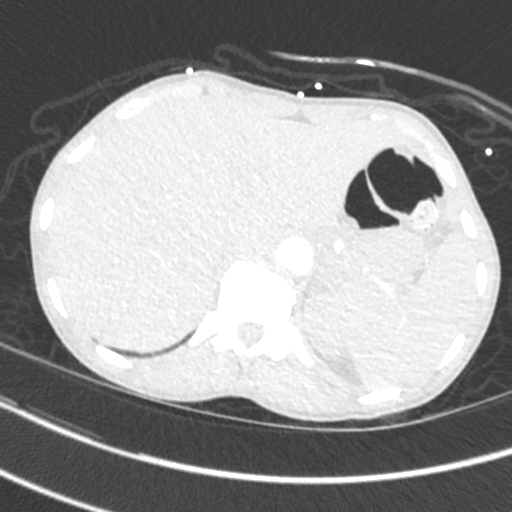
[im 44/389  soft-tissue]
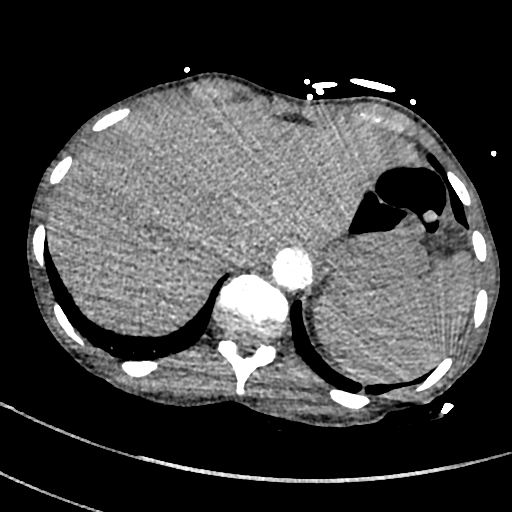
[im 65/389  lung]
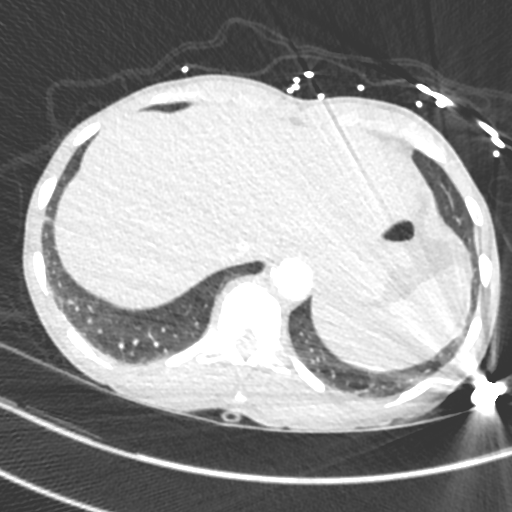
[im 87/389  soft-tissue]
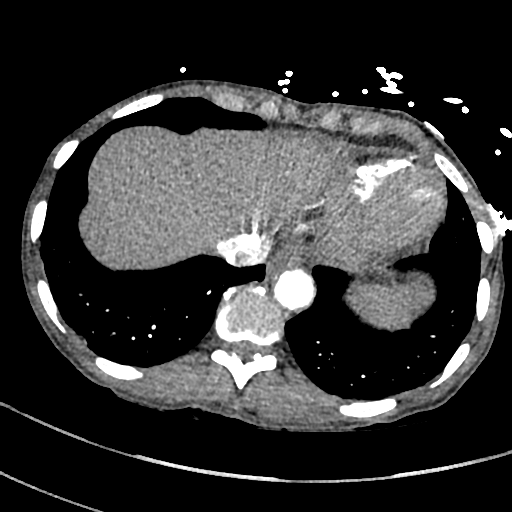
[im 108/389  lung]
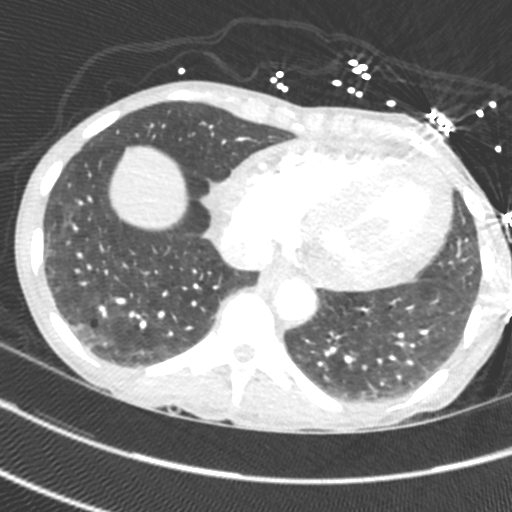
[im 130/389  soft-tissue]
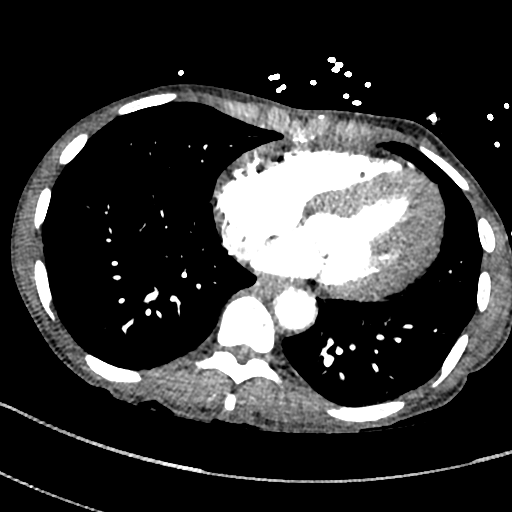
[im 151/389  lung]
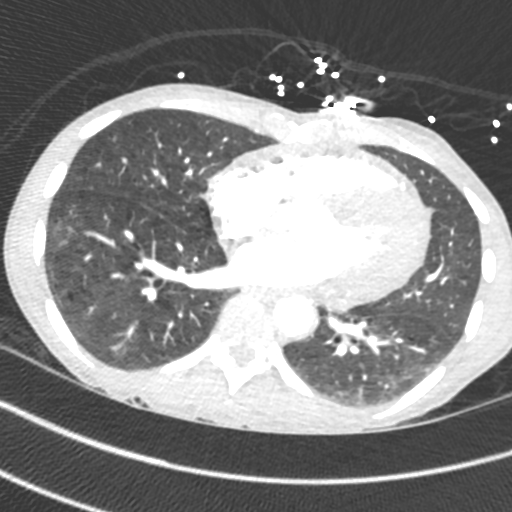
[im 173/389  soft-tissue]
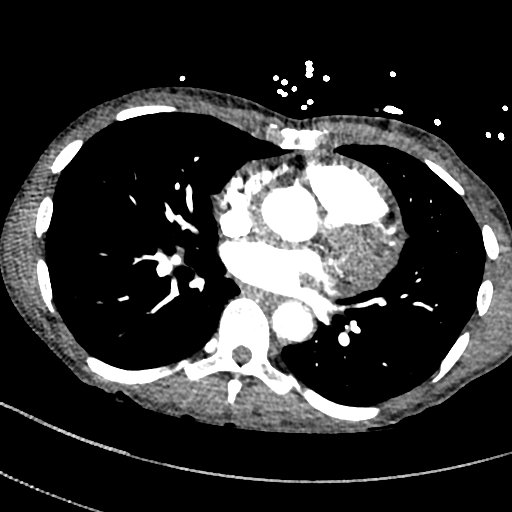
[im 216/389  lung]
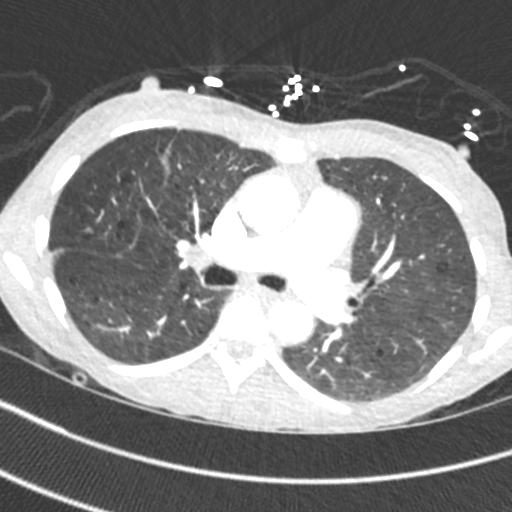
[im 238/389  soft-tissue]
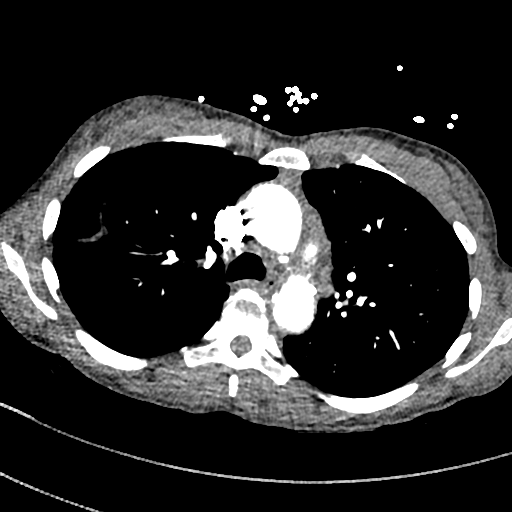
[im 259/389  lung]
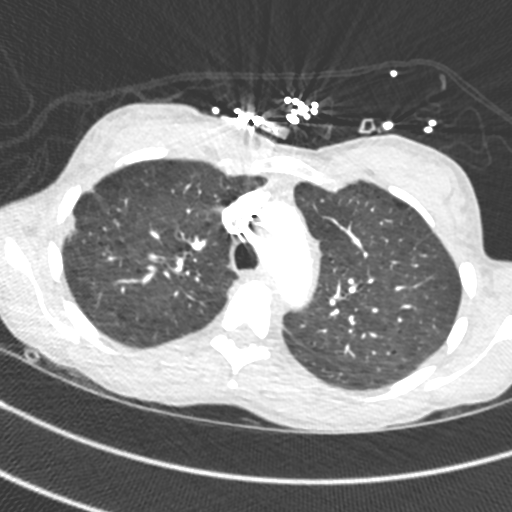
[im 281/389  soft-tissue]
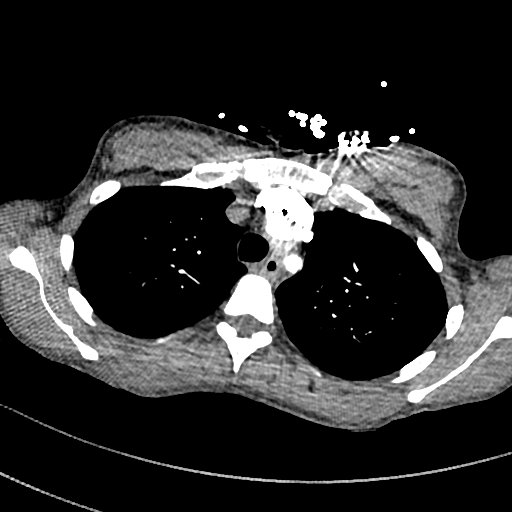
[im 302/389  lung]
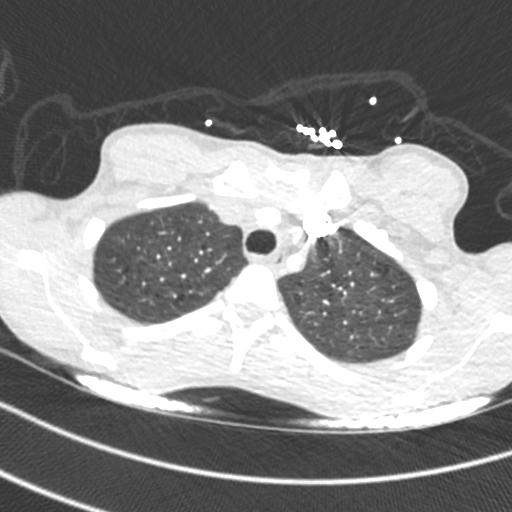
[im 324/389  soft-tissue]
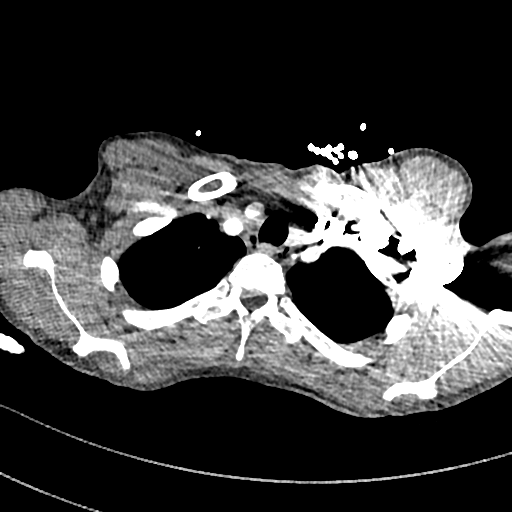
[im 345/389  lung]
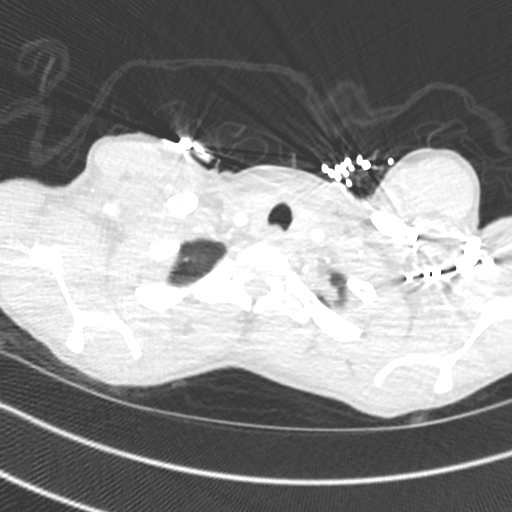
[im 367/389  soft-tissue]
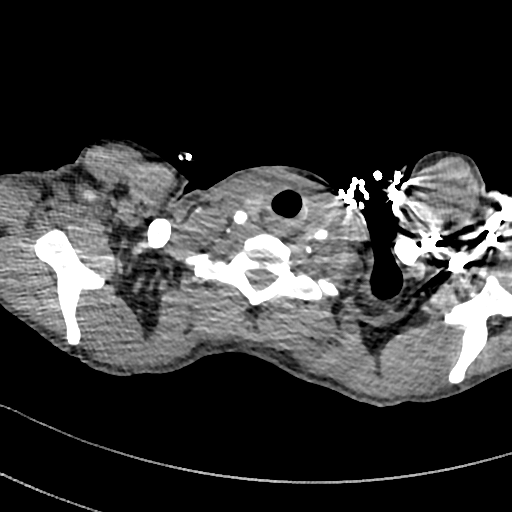

[Series 8: cor · coronal · 0.54mm/px · 3 of 94 slices shown]
[im 24/94  soft-tissue]
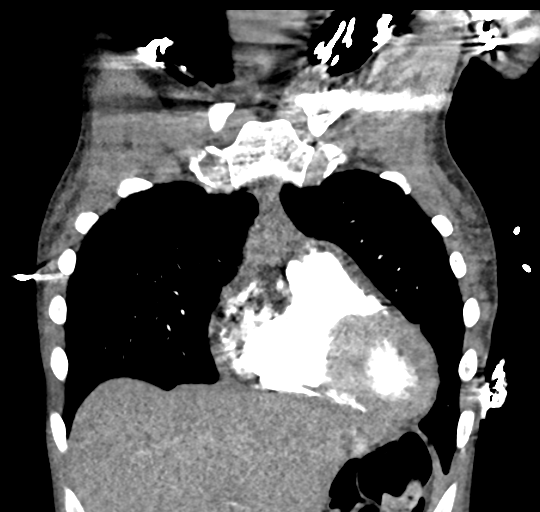
[im 47/94  soft-tissue]
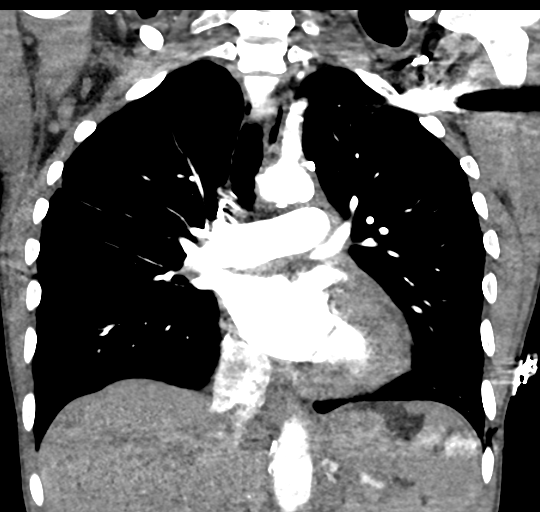
[im 70/94  soft-tissue]
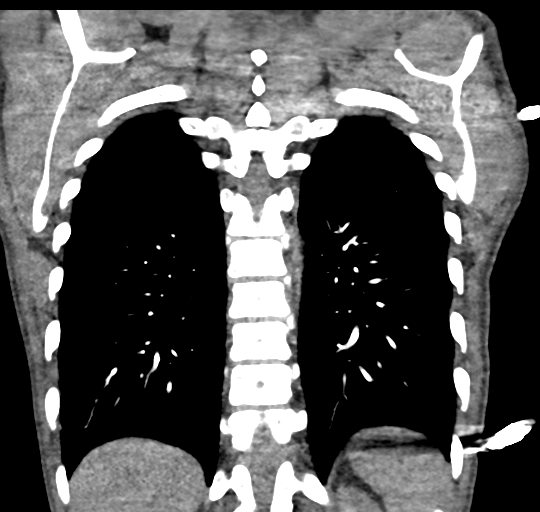

[19 of 46 positions shown; findings below may reference images not displayed]

FINDINGS: Cardiovascular: There are no filling defects within the pulmonary
arteries to suggest pulmonary embolus. Atherosclerotic thoracic
aorta without dissection. Common origin of the innominate and left
common carotid artery, normal variant anatomy. Mild cardiomegaly.
Small pericardial fluid/thickening appears similar.

Mediastinum/Nodes: Prominent right hilar node is unchanged measuring
10 mm short axis. No new or progressive adenopathy. Visualized
thyroid gland and esophagus are unremarkable.

Lungs/Pleura: Moderate emphysema. Linear right upper lobe scarring.
No consolidation, pulmonary edema or pleural fluid.

Upper Abdomen: No acute abnormality or change from prior.

Musculoskeletal: There are no acute or suspicious osseous
abnormalities.

Review of the MIP images confirms the above findings.
IMPRESSION: 1. No pulmonary embolus or acute intrathoracic abnormality.
2. Mild emphysema with right upper lobe scarring.

Aortic Atherosclerosis (TY935-5SX.X) and Emphysema (TY935-U7J.A).

## 2019-06-29 IMAGING — CR DG TIBIA/FIBULA 2V*R*
2 series · 2 of 2 positions shown · non-contrast
Comparison: Right tibia/fibula radiographs performed 04/08/2017

CLINICAL DATA: Status post fall out of wheelchair, with right knee
pain. Initial encounter.

EXAM:
RIGHT TIBIA AND FIBULA - 2 VIEW

[x tib-fib ap right]
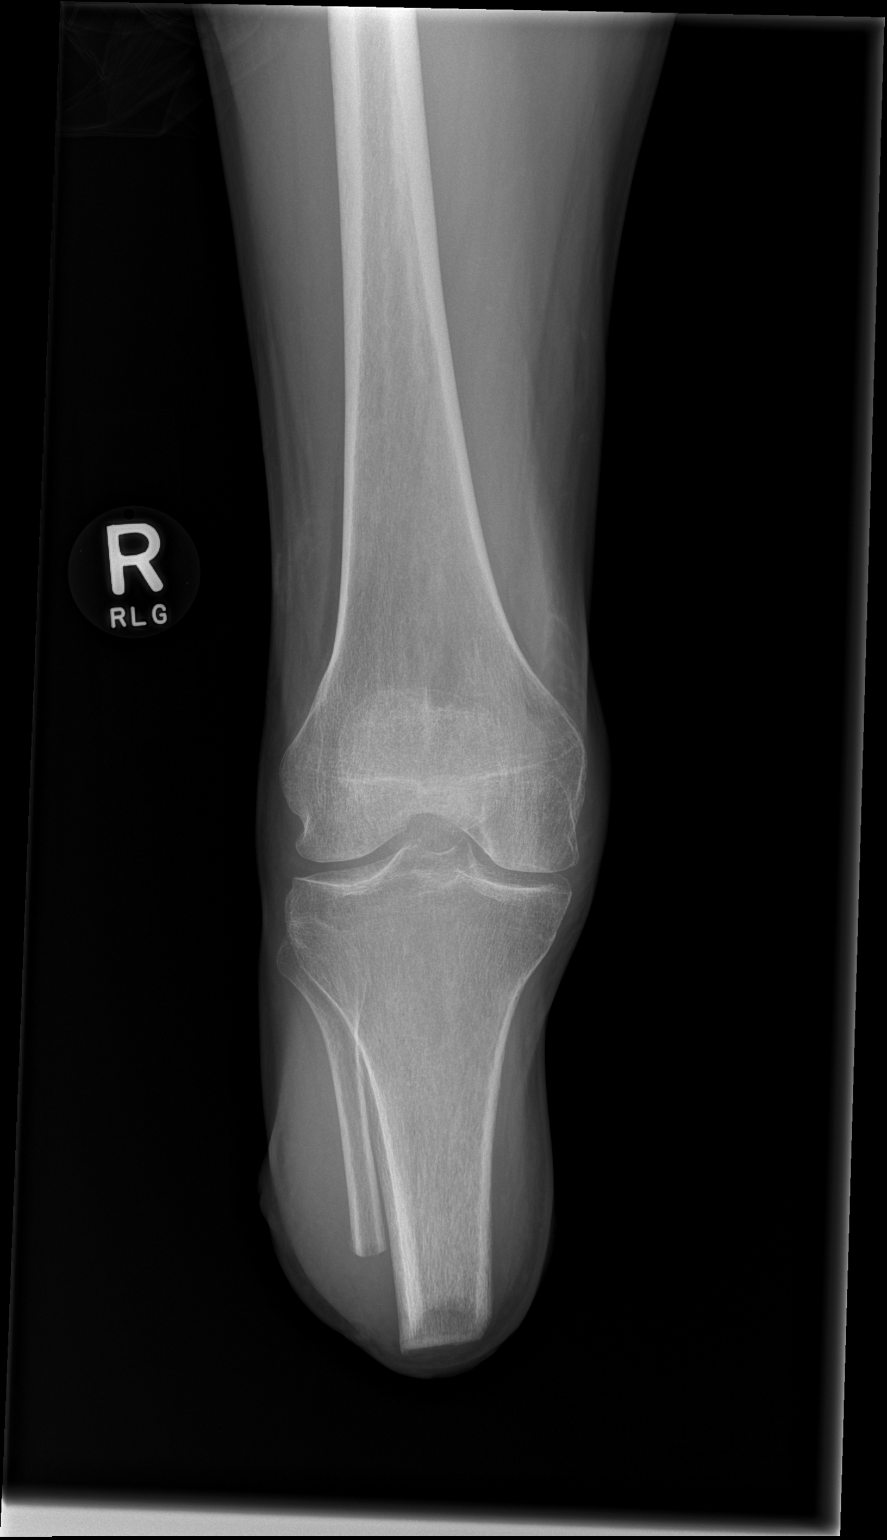

[x tib-fib lat right]
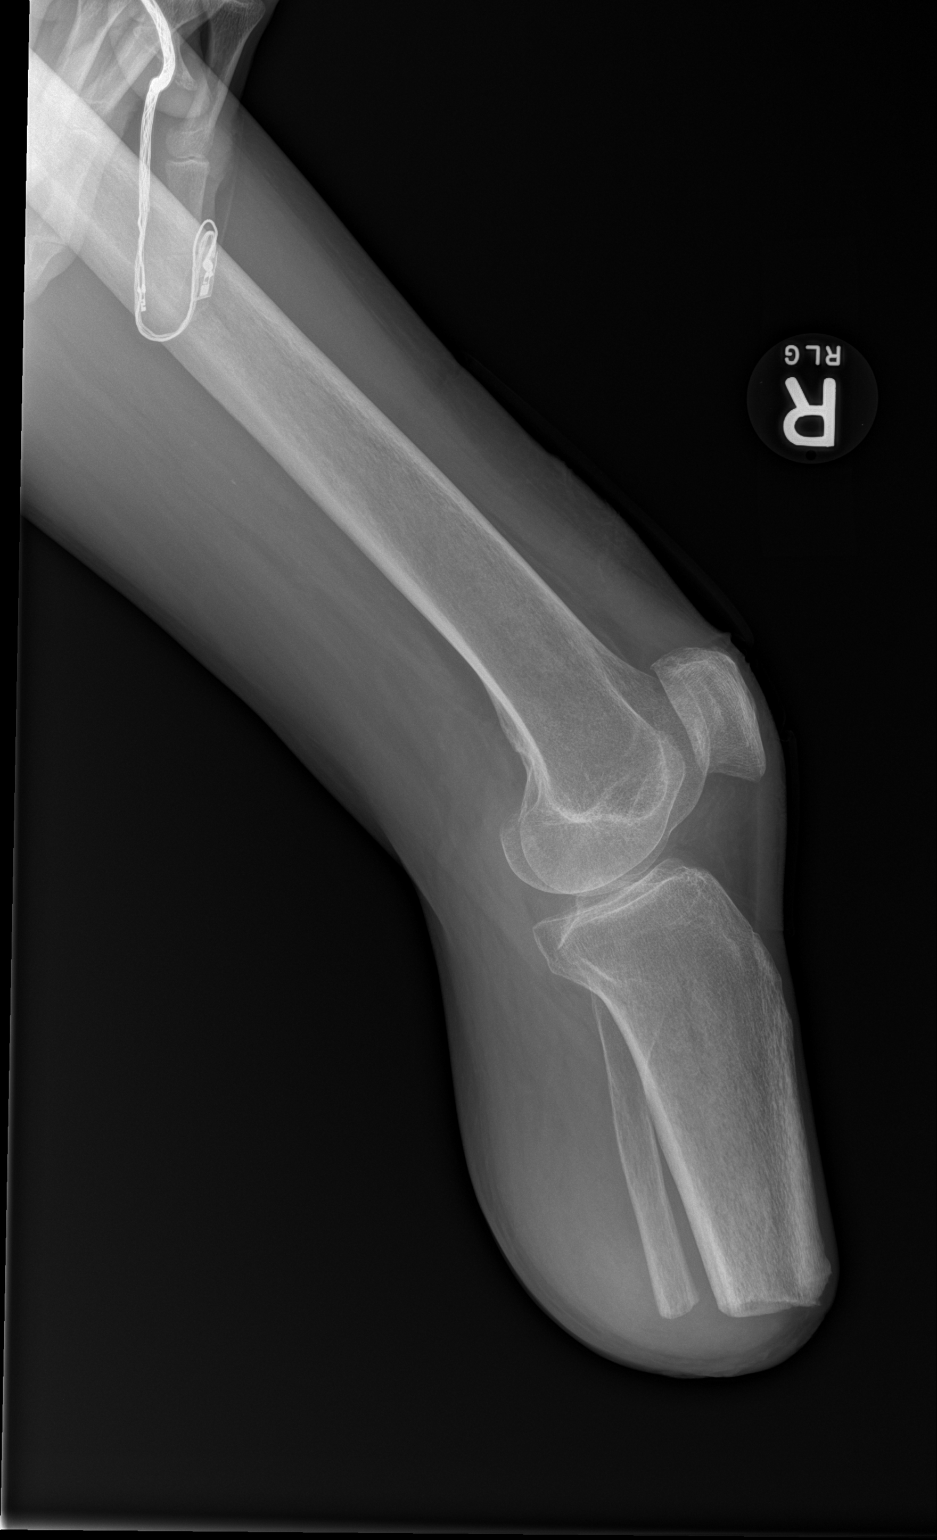

[2 of 2 positions shown; findings below may reference images not displayed]

FINDINGS: The patient's below-the-knee amputation is unremarkable in
appearance. No osseous erosions are seen.

There is no evidence of fracture or dislocation. The knee joint is
unremarkable in appearance. No knee joint effusion is identified. No
definite soft abnormalities are characterized on radiograph.
IMPRESSION: No evidence of fracture or dislocation.

## 2019-06-29 IMAGING — CR DG TIBIA/FIBULA 2V*L*
2 series · 2 of 2 positions shown · non-contrast
Comparison: Left tibia/fibula radiographs performed 04/08/2017

CLINICAL DATA: Status post fall out of wheelchair, with left knee
pain. Initial encounter.

EXAM:
LEFT TIBIA AND FIBULA - 2 VIEW

[x tib-fib lat left]
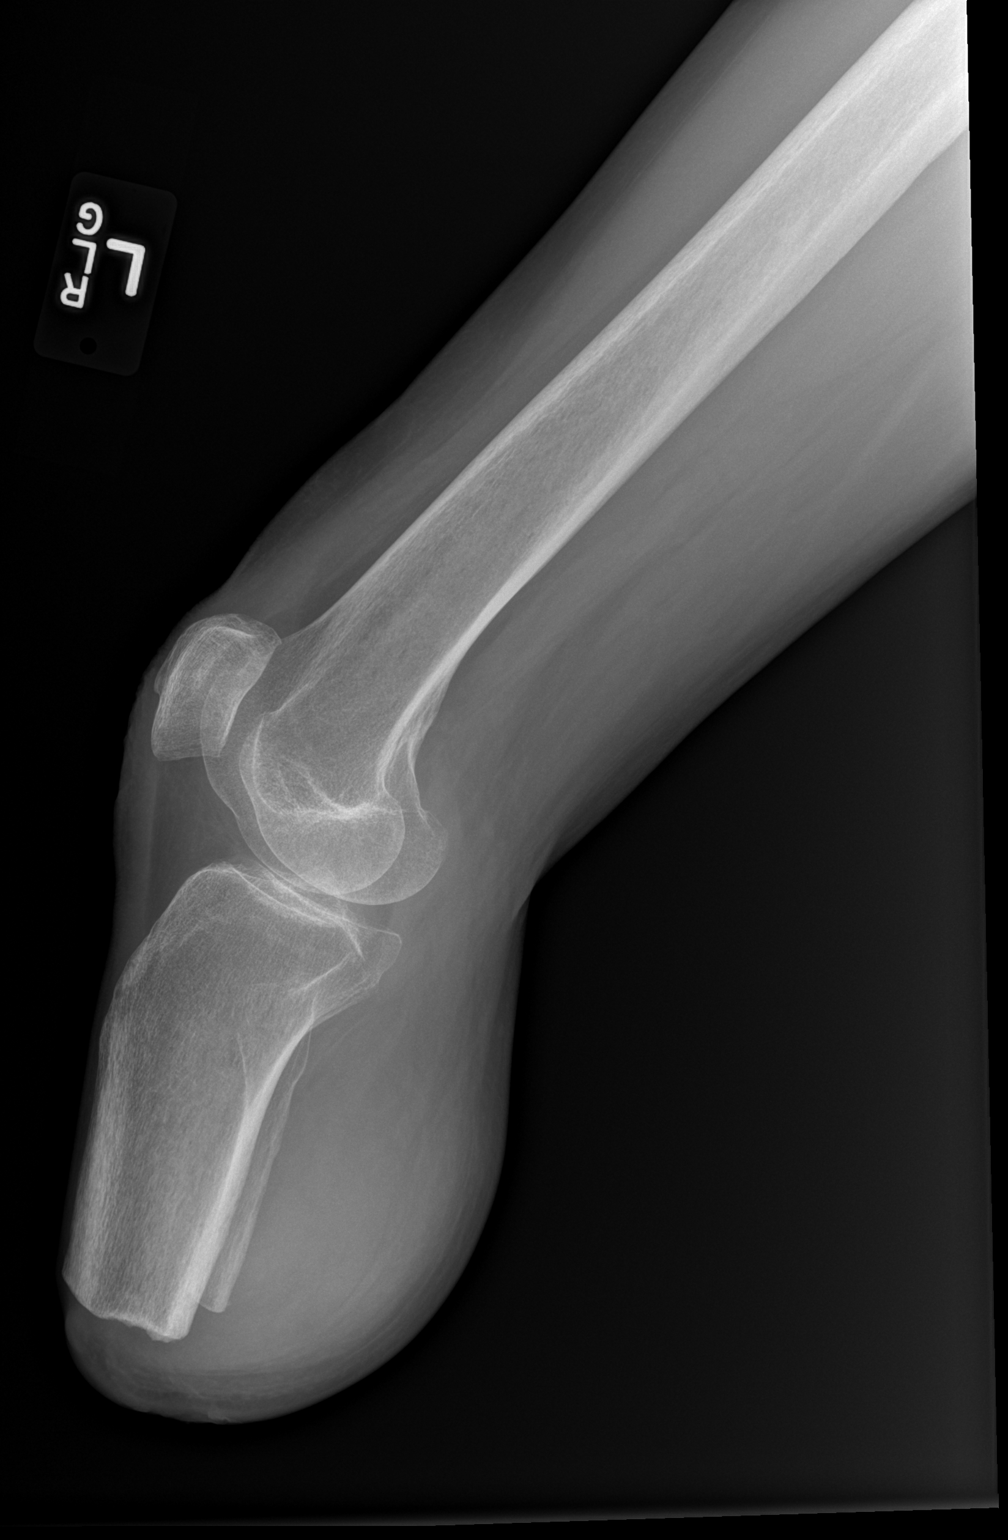

[x tib-fib ap left]
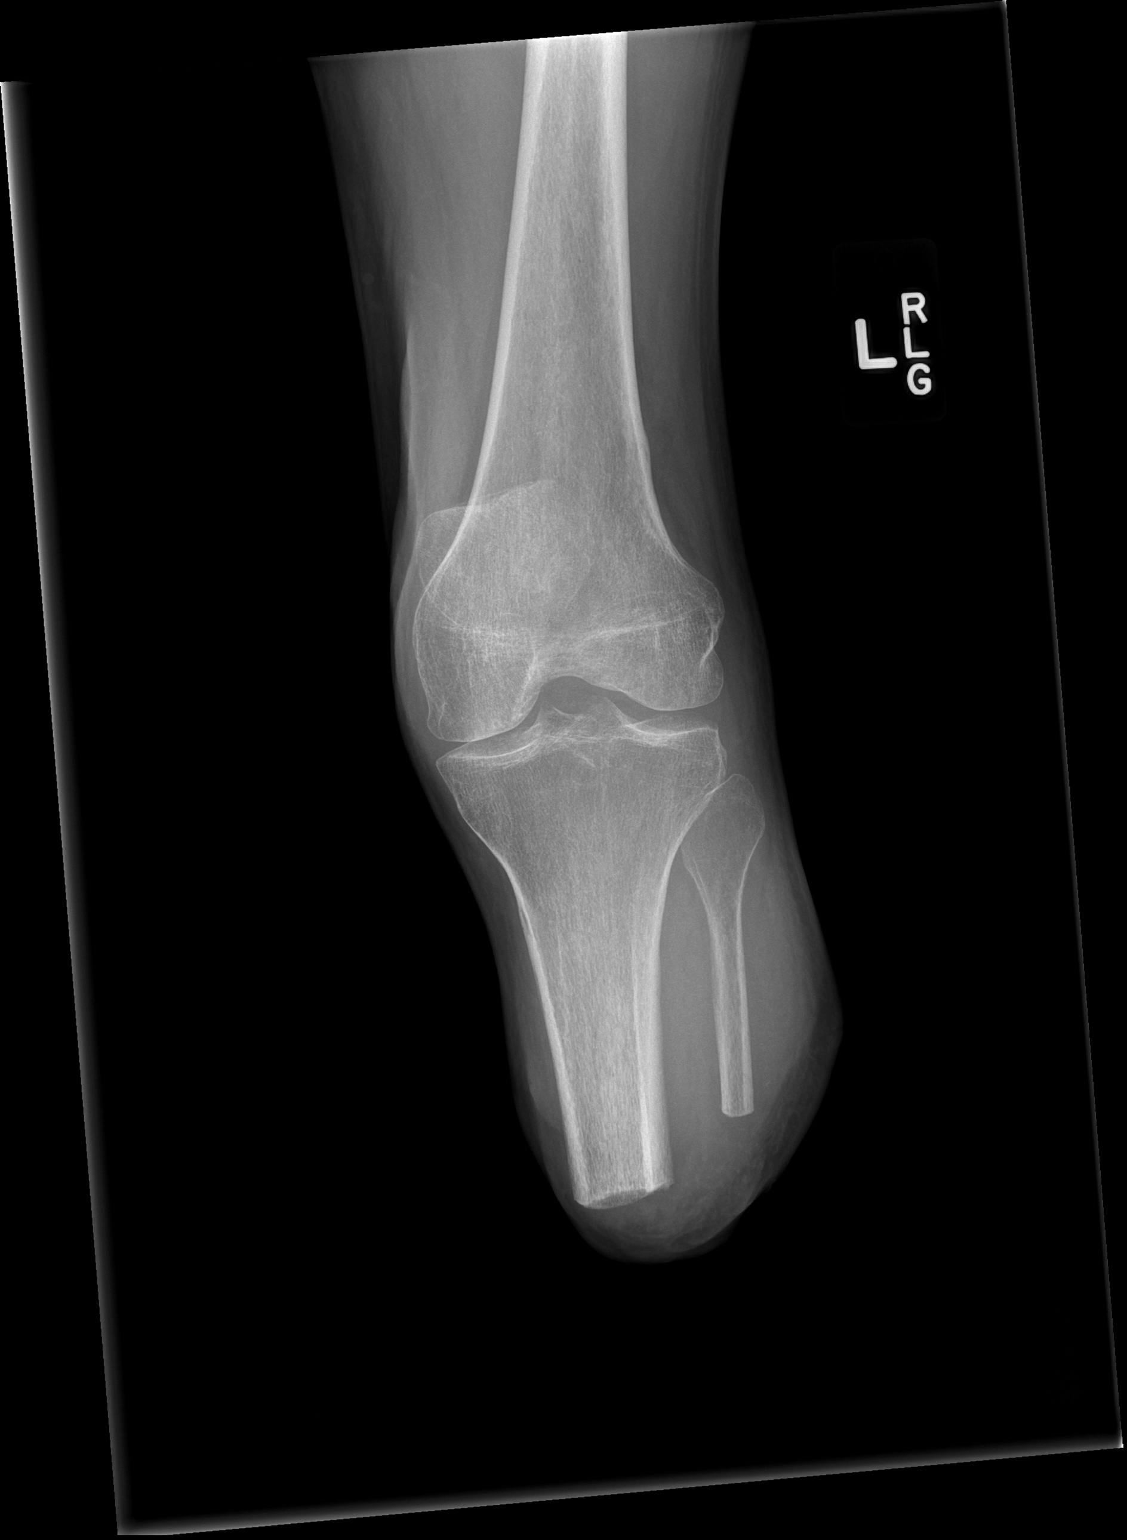

[2 of 2 positions shown; findings below may reference images not displayed]

FINDINGS: The below the knee amputation is grossly unremarkable in appearance.
No osseous erosions are seen

There is no evidence of fracture or dislocation. The knee joint is
unremarkable. No knee joint effusion is identified. Visualized joint
spaces are preserved.
IMPRESSION: No evidence of fracture or dislocation.

## 2019-07-01 ENCOUNTER — Encounter (HOSPITAL_COMMUNITY): Payer: Self-pay | Admitting: Emergency Medicine

## 2019-07-01 ENCOUNTER — Emergency Department (HOSPITAL_COMMUNITY)
Admission: EM | Admit: 2019-07-01 | Discharge: 2019-07-01 | Disposition: A | Discharge status: Home / Self Care | Payer: Medicaid Other | Attending: Emergency Medicine | Admitting: Emergency Medicine

## 2019-07-01 DIAGNOSIS — M79605 Pain in left leg: Secondary | ICD-10-CM | POA: Diagnosis present

## 2019-07-01 DIAGNOSIS — Z89511 Acquired absence of right leg below knee: Secondary | ICD-10-CM | POA: Diagnosis not present

## 2019-07-01 DIAGNOSIS — I10 Essential (primary) hypertension: Secondary | ICD-10-CM | POA: Diagnosis not present

## 2019-07-01 DIAGNOSIS — F17210 Nicotine dependence, cigarettes, uncomplicated: Secondary | ICD-10-CM | POA: Diagnosis not present

## 2019-07-01 DIAGNOSIS — Z89512 Acquired absence of left leg below knee: Secondary | ICD-10-CM | POA: Diagnosis not present

## 2019-07-01 DIAGNOSIS — M79604 Pain in right leg: Secondary | ICD-10-CM | POA: Diagnosis not present

## 2019-07-01 DIAGNOSIS — M79609 Pain in unspecified limb: Secondary | ICD-10-CM

## 2019-07-01 DIAGNOSIS — T8789 Other complications of amputation stump: Secondary | ICD-10-CM

## 2019-07-01 LAB — CBC WITH DIFFERENTIAL/PLATELET
Abs Immature Granulocytes: 0.02 10*3/uL (ref 0.00–0.07)
Basophils Absolute: 0 10*3/uL (ref 0.0–0.1)
Basophils Relative: 0 %
Eosinophils Absolute: 0.1 10*3/uL (ref 0.0–0.5)
Eosinophils Relative: 1 %
HCT: 35.3 % — ABNORMAL LOW (ref 36.0–46.0)
Hemoglobin: 10.8 g/dL — ABNORMAL LOW (ref 12.0–15.0)
Immature Granulocytes: 0 %
Lymphocytes Relative: 39 %
Lymphs Abs: 2.4 10*3/uL (ref 0.7–4.0)
MCH: 26.7 pg (ref 26.0–34.0)
MCHC: 30.6 g/dL (ref 30.0–36.0)
MCV: 87.4 fL (ref 80.0–100.0)
Monocytes Absolute: 0.5 10*3/uL (ref 0.1–1.0)
Monocytes Relative: 8 %
Neutro Abs: 3.2 10*3/uL (ref 1.7–7.7)
Neutrophils Relative %: 52 %
Platelets: 258 10*3/uL (ref 150–400)
RBC: 4.04 MIL/uL (ref 3.87–5.11)
RDW: 15.9 % — ABNORMAL HIGH (ref 11.5–15.5)
WBC: 6.2 10*3/uL (ref 4.0–10.5)
nRBC: 0 % (ref 0.0–0.2)

## 2019-07-01 LAB — BASIC METABOLIC PANEL
Anion gap: 7 (ref 5–15)
BUN: 20 mg/dL (ref 6–20)
CO2: 19 mmol/L — ABNORMAL LOW (ref 22–32)
Calcium: 8.9 mg/dL (ref 8.9–10.3)
Chloride: 112 mmol/L — ABNORMAL HIGH (ref 98–111)
Creatinine, Ser: 0.81 mg/dL (ref 0.44–1.00)
GFR calc Af Amer: >60 mL/min (ref >60)
GFR calc non Af Amer: >60 mL/min (ref >60)
Glucose, Bld: 99 mg/dL (ref 70–99)
Potassium: 4 mmol/L (ref 3.5–5.1)
Sodium: 138 mmol/L (ref 135–145)

## 2019-07-01 MED ORDER — AMLODIPINE BESYLATE 5 MG PO TABS
5.0000 mg | ORAL_TABLET | Freq: Once | ORAL | Status: AC
  Administered 2019-07-01: 05:00:00 5 mg via ORAL
  Filled 2019-07-01: qty 1

## 2019-07-01 MED ORDER — LIDOCAINE HCL URETHRAL/MUCOSAL 2 % EX GEL
1.0000 "application " | Freq: Once | CUTANEOUS | Status: AC
  Administered 2019-07-01: 1 via TOPICAL
  Filled 2019-07-01: qty 5

## 2019-07-01 NOTE — Discharge Instructions (Signed)
Labs today looked good, no signs of infection on either stump. Your blood pressure was elevated today.  I do recommend that you follow-up with your primary care doctor about this. Return here for any new/acute changes.

## 2019-07-01 NOTE — ED Provider Notes (Signed)
[facility] DEPT Provider Note   CSN: [id number] Arrival date & time: 07/01/19  0056     History   Chief Complaint Chief Complaint  Patient presents with  . Leg Pain    bilateral leg pain / doubel amputation/ 2 yrs ago     HPI [name] is a 58 y.o. female.     The history is provided by the patient, medical records and the EMS personnel.  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  This is a chronic issue for her.  States she has noticed some blisters and scabs on her stumps and stated she felt like she may have had a fever.  She has no documented fever with EMS nor here in the ED.  She denies any drainage or bleeding from areas.  She last used crack cocaine 3 days ago, states "I am trying to kick it".  Of note, patient's blood pressure is elevated here.  It appears a few months ago her blood pressure medications were stopped due to episodes of hypotension.  She denies any chest pain, shortness of breath, or headache.  Past Medical History:  Diagnosis Date  . CAP (community acquired pneumonia) 03/2014 X 2  . Cocaine abuse ([place])    ongoing with resultant vaculitis.  . Depression   . Headache    "weekly" (07/29/2016)  . Hypertension   . Inflammatory arthritis   . Migraines    "probably 5-6/yr" (07/29/2016)  . Normocytic anemia    BL Hgb 9.8-12. Last anemia panel 04/2010 - showing Fe 19, ferritin 101.  Pt on monthly B12 injections  . Rheumatoid arthritis(714.0)    patient reported  . S/P bilateral below knee amputation ([place]) 04/13/2017  . 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 Followed by Dr. Louanne Skye    Patient Active Problem List   Diagnosis Date Noted  . Disability of walking 05/28/2019  . At risk for adverse  drug event 08/30/2018  . Microcytic hypochromic anemia 08/30/2018  . Thrombocytopenia ([place]) 08/20/2018  . Toxic encephalopathy 08/20/2018  . Goals of care, counseling/discussion   . Palliative care by specialist   . DNR (do not resuscitate) discussion   . Infiltrate noted on imaging study   . Ulcer of right thigh, limited to breakdown of skin ([place])   . S/P bilateral above knee amputation ([place])   . Homelessness 07/30/2018  . Elevated LFTs 07/30/2018  . Osteomyelitis ([place]) 07/30/2018  . Acute encephalopathy 07/30/2018  . Eye swelling, bilateral 07/30/2018  . Skin rash 07/11/2018  . Acute on chronic diastolic CHF (congestive heart failure) ([place]) 06/21/2018  . Malnutrition of moderate degree 05/20/2018  . Acute congestive heart failure ([place])   . Suicidal ideation 02/02/2018  . HCAP (healthcare-associated pneumonia) 02/02/2018  . Acute on chronic respiratory failure with hypoxia ([place]) 02/02/2018  . Sepsis ([place]) 02/02/2018  . Ulcer of amputation stump of lower extremity ([place]) 10/23/2017  . Cocaine abuse with cocaine-induced mood disorder ([place]) 08/10/2017  . Hypertensive crisis   . Phantom limb pain ([place])   . Tobacco abuse   . Post-operative pain   . Acute blood loss anemia   . 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 AKI (acute kidney injury) ([place]) 02/07/2017  . Acute kidney injury (  [place]) 02/06/2017  . Wound healing, delayed   . Chest pain 04/07/2015  . Protein-calorie malnutrition, severe ([place]) 01/15/2015  . MDD (major depressive disorder), recurrent episode, severe ([place]) 10/16/2014  . Cocaine use disorder, severe, dependence ([place]) 10/10/2014  . 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 Essential hypertension 02/26/2010    Past Surgical History:  Procedure Laterality Date  . AMPUTATION Left 05/22/2016   Procedure: AMPUTATION LEFT LONG FINGER;  Surgeon: Marybelle Killings, MD;  Location: [facility];  Service: Orthopedics;   Laterality: Left;  . AMPUTATION Bilateral 04/10/2017   Procedure: AMPUTATION BELOW KNEE;  Surgeon: Newt Minion, MD;  Location: [facility];  Service: Orthopedics;  Laterality: Bilateral;  . AMPUTATION Bilateral 02/06/2018   Procedure: AMPUTATION ABOVE KNEE;  Surgeon: Newt Minion, MD;  Location: [facility];  Service: Orthopedics;  Laterality: Bilateral;  . HERNIA REPAIR     "stomach"  . I&D EXTREMITY Right 09/26/2015   Procedure: IRRIGATION AND DEBRIDEMENT LEG WOUND  VAC PLACEMENT.;  Surgeon: Loel Lofty Dillingham, DO;  Location: [facility];  Service: Plastics;  Laterality: Right;  . INCISION AND DRAINAGE OF WOUND Bilateral 10/20/2016   Procedure: IRRIGATION AND DEBRIDEMENT WOUND BILATERAL;  Surgeon: Edrick Kins, DPM;  Location: [facility];  Service: Podiatry;  Laterality: Bilateral;  . IRRIGATION AND DEBRIDEMENT ABSCESS Bilateral 09/26/2013   Procedure: DEBRIDEMENT ULCERS BILATERAL THIGHS;  Surgeon: Gwenyth Ober, MD;  Location: [facility];  Service: General;  Laterality: Bilateral;  . SKIN BIOPSY Bilateral    shin nodules     OB History   No obstetric history on file.      Home Medications    Prior to Admission medications   Medication Sig Start Date End Date Taking? Authorizing Provider  clotrimazole-betamethasone (LOTRISONE) cream Apply to affected area 2 times daily prn 12/05/18   Pollina, Gwenyth Allegra, MD  doxycycline (VIBRAMYCIN) 100 MG capsule Take 1 capsule (100 mg total) by mouth 2 (two) times daily. 04/16/19   Fredia Sorrow, MD  lidocaine (XYLOCAINE) 5 % ointment Apply 1 application topically 4 (four) times daily as needed. Pain 12/05/18   Orpah Greek, MD  oxyCODONE (OXY IR/ROXICODONE) 5 MG immediate release tablet Take 1 tablet (5 mg total) by mouth every 4 (four) hours as needed for severe pain. 05/24/19   Azzie Glatter, FNP  predniSONE (DELTASONE) 10 MG tablet Take 4 tablets (40 mg total) by mouth daily. 04/16/19   Fredia Sorrow, MD    Family History Family History  Problem  Relation Age of Onset  . Breast cancer Mother        Breast cancer  . Alcohol abuse Mother   . Colon cancer Maternal Aunt 25  . Alcohol abuse Father     Social History Social History   Tobacco Use  . Smoking status: Current Every Day Smoker    Packs/day: 0.12    Years: 38.00    Pack years: 4.56    Types: Cigarettes  . Smokeless tobacco: Never Used  . Tobacco comment: 2 A DAY  Substance Use Topics  . Alcohol use: No    Alcohol/week: 0.0 standard drinks  . Drug use: Yes    Types: "Crack" cocaine, Cocaine    Comment: Smoked crack 06/28/2019     Allergies   Levamisole, Acetaminophen, Haldol [haloperidol lactate], Lisinopril, and Toradol [ketorolac tromethamine]   Review of Systems Review of Systems  Musculoskeletal: Positive for arthralgias.  All other systems reviewed and are negative.    Physical Exam Updated Vital  Signs BP (!) 174/128   Pulse 93   Temp 98.1 F (36.7 C) (Oral)   Resp (!) 29   SpO2 95%   Physical Exam Vitals signs and nursing note reviewed.  Constitutional:      Appearance: She is well-developed.     Comments: Sleeping, had to be awoken for exam  HENT:     Head: Normocephalic and atraumatic.  Eyes:     Conjunctiva/sclera: Conjunctivae normal.     Pupils: Pupils are equal, round, and reactive to light.  Neck:     Musculoskeletal: Normal range of motion.  Cardiovascular:     Rate and Rhythm: Normal rate and regular rhythm.     Heart sounds: Normal heart sounds.  Pulmonary:     Effort: Pulmonary effort is normal.     Breath sounds: Normal breath sounds.  Abdominal:     General: Bowel sounds are normal.     Palpations: Abdomen is soft.  Musculoskeletal: Normal range of motion.     Comments: Bilateral AKA's, scattered small areas that appear old and scabbed over, no new ulceration or blistering observed, no erythema, induration, or signs of cellulitis, no significant warmth to touch  Skin:    General: Skin is warm and dry.   Neurological:     Mental Status: She is alert and oriented to person, place, and time.      ED Treatments / Results  Labs (all labs ordered are listed, but only abnormal results are displayed) Labs Reviewed  CBC WITH DIFFERENTIAL/PLATELET - Abnormal; Notable for the following components:      Result Value   Hemoglobin 10.8 (*)    HCT 35.3 (*)    RDW 15.9 (*)    All other components within normal limits  BASIC METABOLIC PANEL - Abnormal; Notable for the following components:   Chloride 112 (*)    CO2 19 (*)    All other components within normal limits    EKG None  Radiology No results found.  Procedures Procedures (including critical care time)  Medications Ordered in ED Medications  lidocaine (XYLOCAINE) 2 % jelly 1 application (1 application Topical Given 07/01/19 0404)  amLODipine (NORVASC) tablet 5 mg (5 mg Oral Given 07/01/19 0456)     Initial Impression / Assessment and Plan / ED Course  I have reviewed the triage vital signs and the nursing notes.  Pertinent labs & imaging results that were available during my care of the patient were reviewed by me and considered in my medical decision making (see chart for details).  58 year old female here with pain of her bilateral stumps.  This is a chronic issue for her.  She reports scabs and blisters.  On exam she is afebrile and nontoxic.  She was actually sleeping and had to be awoken for exam.  She has bilateral AKA's with multiple small areas of scabbed lesions.  These appear old.  There is no surrounding induration or signs of cellulitis, no warmth to touch.  She is afebrile on recheck x2.  Of note, patient hypertensive.  History of same but medications were stopped in the past few months due to episodes of hypotension.  It appears per last PCP note that she has been managed with DASH diet at her group home.  Will check screening labs and monitor BP.  4:41 AM Patient's labs are overall reassuring.  Her BP remains somewhat  elevated here.  It appears she was previously on Norvasc as well as Coreg.  We will give her  dose of Norvasc here.  No evidence of endorgan damage at this time.  Patient continues resting comfortably.  6:12 AM Patient continues resting comfortably.  Her BP has trended down somewhat here, now 182/114.  She remains asymptomatic of this.  I do not feel she requires ongoing emergent work-up or admission at this time.  She will need close follow-up with her PCP regarding BP given issues with hypotension in the past.  Return here for any new/acute changes.  Final Clinical Impressions(s) / ED Diagnoses   Final diagnoses:  Stump pain [facility])    ED Discharge Orders    None       Larene Pickett, PA-C 65/78/46 [id number]    Delora Fuel, MD 52/84/13 0800

## 2019-07-01 NOTE — ED Triage Notes (Addendum)
Pt comes to ed via ems, c/o pain in legs hubs bilateral  , blistering and and scab lesions . Pt noticed the redness and pain just today per verbal of ems. Pt is alert x 4. Pt uses a wheel chair at home. Pt admits to smoking crack this week. Rashes showed up after.  Pt vs/ 184/110, pluse 90 , spo2 97, cbg 117, temp 97.8, rr18.

## 2019-07-03 IMAGING — CR DG CHEST 2V
2 series · 2 of 2 positions shown · non-contrast
Comparison: Recent CT from 07/04/2017.

CLINICAL DATA: Initial evaluation for acute mid chest pain.

EXAM:
CHEST  2 VIEW

[chest lat]
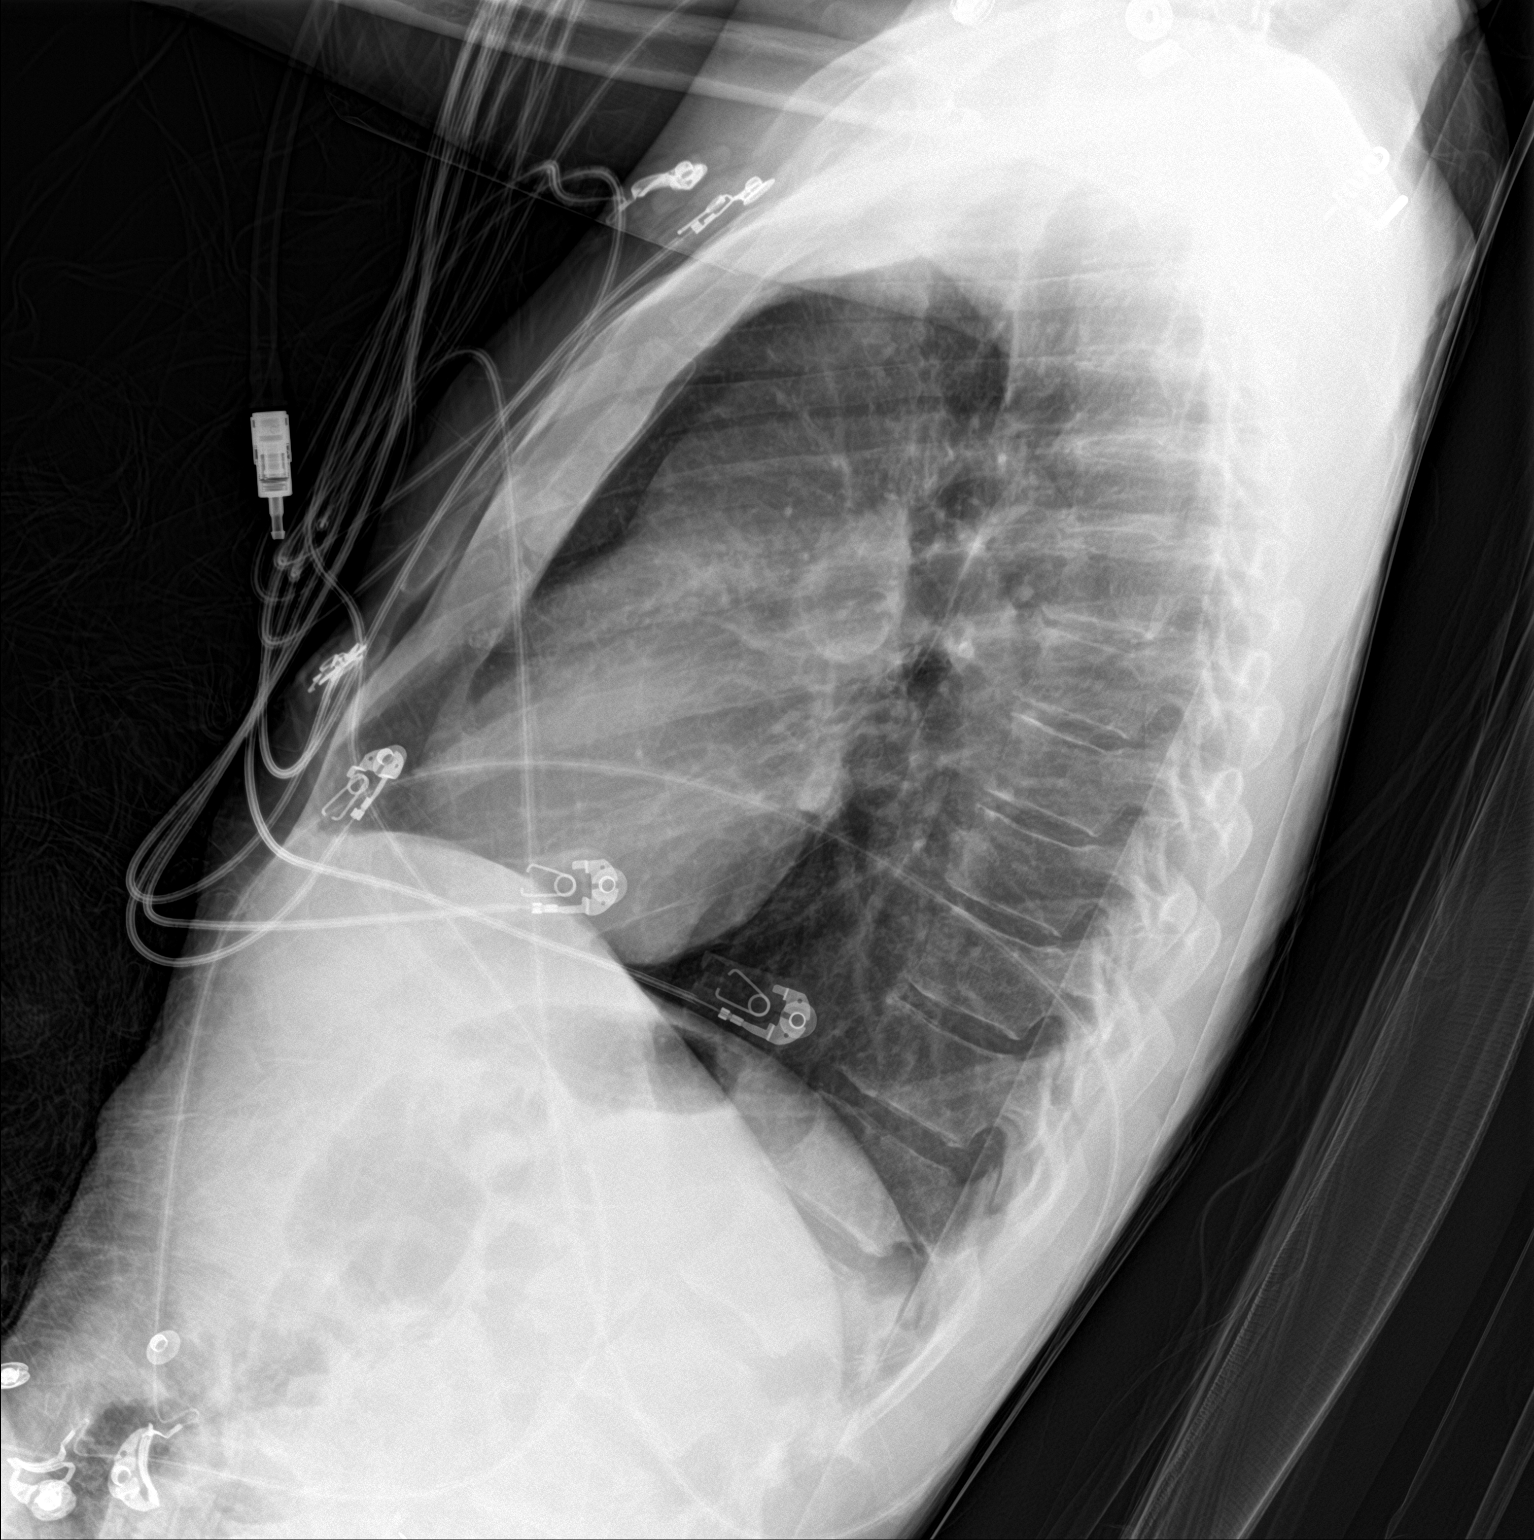

[chest ap]
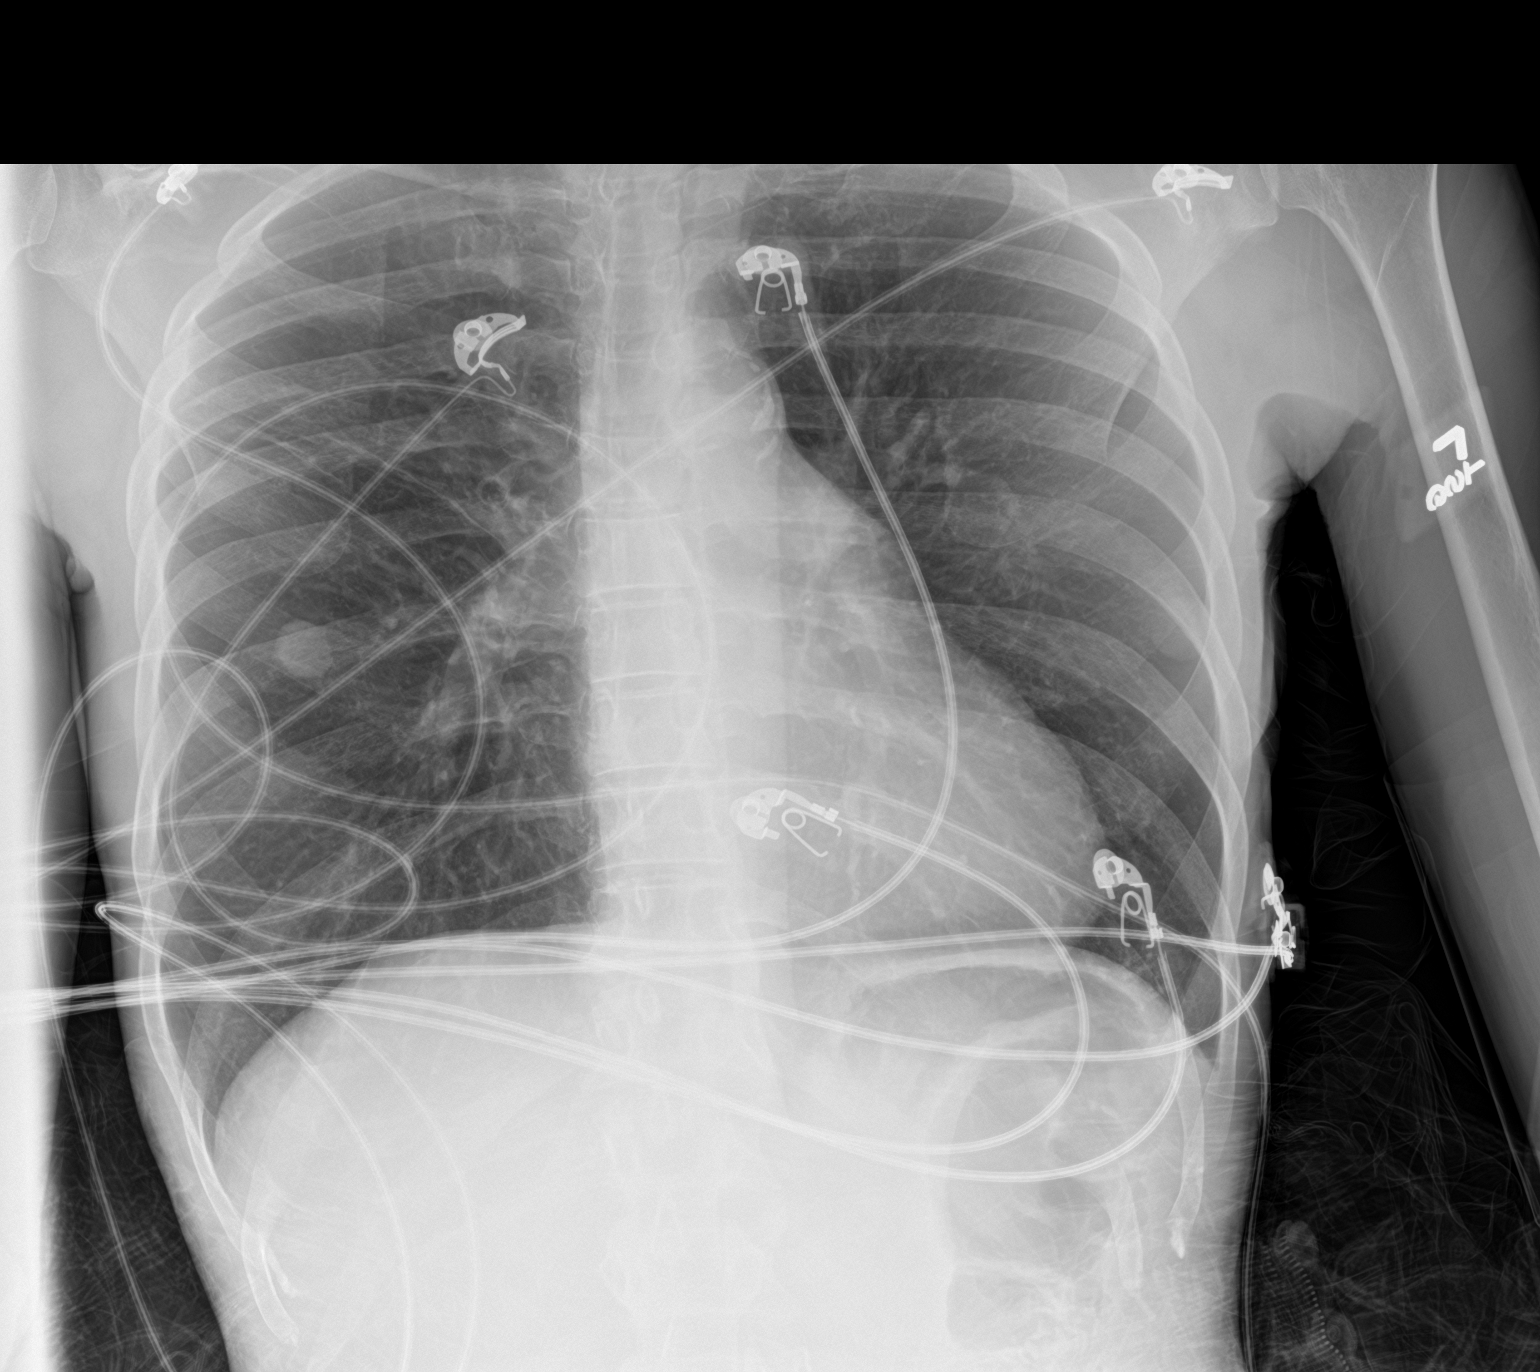

[2 of 2 positions shown; findings below may reference images not displayed]

FINDINGS: Mild cardiomegaly. Mediastinal silhouette normal. Aortic
atherosclerosis.

Lungs mildly hyperinflated with emphysematous changes. Nodular
densities overlying the mid lungs bilaterally consistent with nipple
shadows. No focal infiltrates. No pulmonary edema or pleural
effusion. No pneumothorax.

No acute osseus abnormality.
IMPRESSION: 1. No active cardiopulmonary disease.
2. Emphysema.
3. Cardiomegaly with aortic atherosclerosis.

## 2019-07-05 ENCOUNTER — Telehealth: Payer: Self-pay | Admitting: Family Medicine

## 2019-07-05 NOTE — Telephone Encounter (Signed)
Called and l/m for pt call back.

## 2019-07-15 ENCOUNTER — Other Ambulatory Visit: Payer: Self-pay

## 2019-07-15 ENCOUNTER — Emergency Department (HOSPITAL_COMMUNITY)
Admission: EM | Admit: 2019-07-15 | Discharge: 2019-07-15 | Disposition: A | Discharge status: Home / Self Care | Payer: Medicaid Other | Attending: Emergency Medicine | Admitting: Emergency Medicine

## 2019-07-15 ENCOUNTER — Encounter (HOSPITAL_COMMUNITY): Payer: Self-pay

## 2019-07-15 DIAGNOSIS — F17210 Nicotine dependence, cigarettes, uncomplicated: Secondary | ICD-10-CM | POA: Diagnosis not present

## 2019-07-15 DIAGNOSIS — M79609 Pain in unspecified limb: Secondary | ICD-10-CM

## 2019-07-15 DIAGNOSIS — I5032 Chronic diastolic (congestive) heart failure: Secondary | ICD-10-CM | POA: Diagnosis not present

## 2019-07-15 DIAGNOSIS — T8789 Other complications of amputation stump: Secondary | ICD-10-CM | POA: Insufficient documentation

## 2019-07-15 DIAGNOSIS — I110 Hypertensive heart disease with heart failure: Secondary | ICD-10-CM | POA: Diagnosis not present

## 2019-07-15 DIAGNOSIS — L089 Local infection of the skin and subcutaneous tissue, unspecified: Secondary | ICD-10-CM | POA: Diagnosis not present

## 2019-07-15 DIAGNOSIS — Z89612 Acquired absence of left leg above knee: Secondary | ICD-10-CM | POA: Insufficient documentation

## 2019-07-15 DIAGNOSIS — Z89611 Acquired absence of right leg above knee: Secondary | ICD-10-CM | POA: Diagnosis not present

## 2019-07-15 DIAGNOSIS — Y69 Unspecified misadventure during surgical and medical care: Secondary | ICD-10-CM | POA: Insufficient documentation

## 2019-07-15 DIAGNOSIS — F1410 Cocaine abuse, uncomplicated: Secondary | ICD-10-CM | POA: Insufficient documentation

## 2019-07-15 DIAGNOSIS — I10 Essential (primary) hypertension: Secondary | ICD-10-CM

## 2019-07-15 LAB — CBC
HCT: 35.1 % — ABNORMAL LOW (ref 36.0–46.0)
Hemoglobin: 10.7 g/dL — ABNORMAL LOW (ref 12.0–15.0)
MCH: 25.8 pg — ABNORMAL LOW (ref 26.0–34.0)
MCHC: 30.5 g/dL (ref 30.0–36.0)
MCV: 84.6 fL (ref 80.0–100.0)
Platelets: 275 10*3/uL (ref 150–400)
RBC: 4.15 MIL/uL (ref 3.87–5.11)
RDW: 16.1 % — ABNORMAL HIGH (ref 11.5–15.5)
WBC: 5.6 10*3/uL (ref 4.0–10.5)
nRBC: 0 % (ref 0.0–0.2)

## 2019-07-15 LAB — BASIC METABOLIC PANEL
Anion gap: 11 (ref 5–15)
BUN: 21 mg/dL — ABNORMAL HIGH (ref 6–20)
CO2: 18 mmol/L — ABNORMAL LOW (ref 22–32)
Calcium: 9 mg/dL (ref 8.9–10.3)
Chloride: 106 mmol/L (ref 98–111)
Creatinine, Ser: 0.76 mg/dL (ref 0.44–1.00)
GFR calc Af Amer: >60 mL/min (ref >60)
GFR calc non Af Amer: >60 mL/min (ref >60)
Glucose, Bld: 82 mg/dL (ref 70–99)
Potassium: 3.7 mmol/L (ref 3.5–5.1)
Sodium: 135 mmol/L (ref 135–145)

## 2019-07-15 IMAGING — CR DG TIBIA/FIBULA 2V*L*
2 series · 2 of 2 positions shown · non-contrast
Comparison: 04/08/2017

CLINICAL DATA: Status post BKA with stump pain

EXAM:
LEFT TIBIA AND FIBULA - 2 VIEW

[x tib-fib ap left]
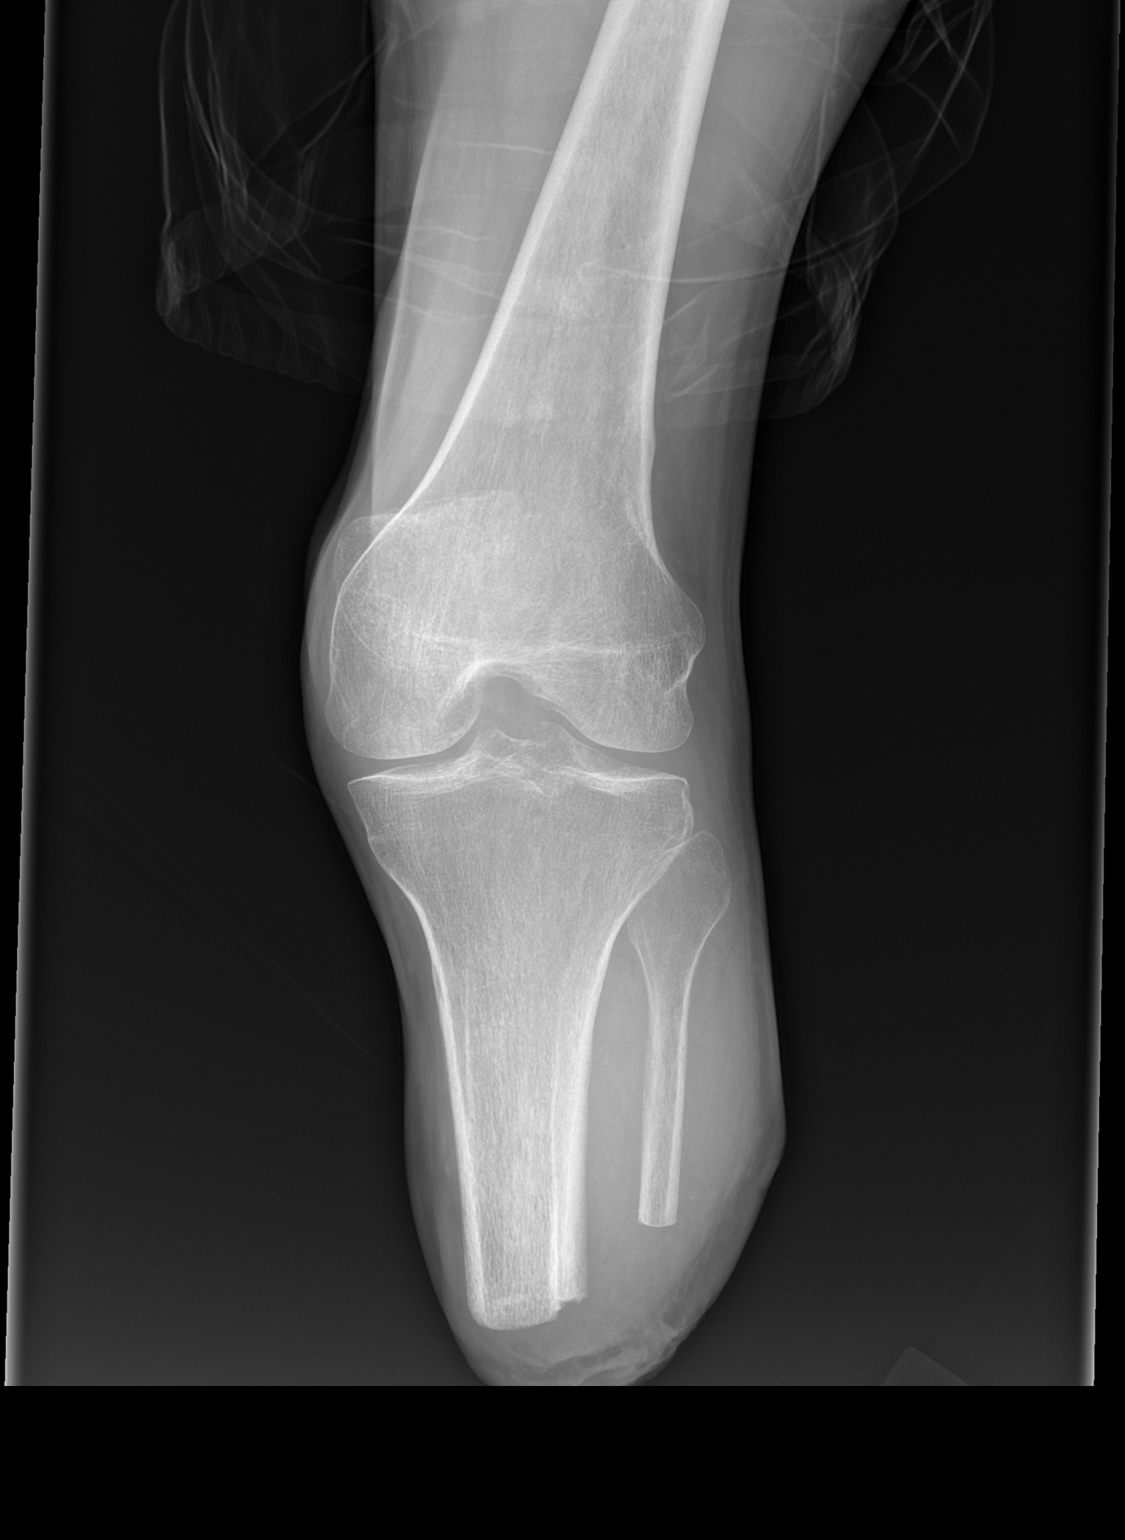

[x tib-fib lat left]
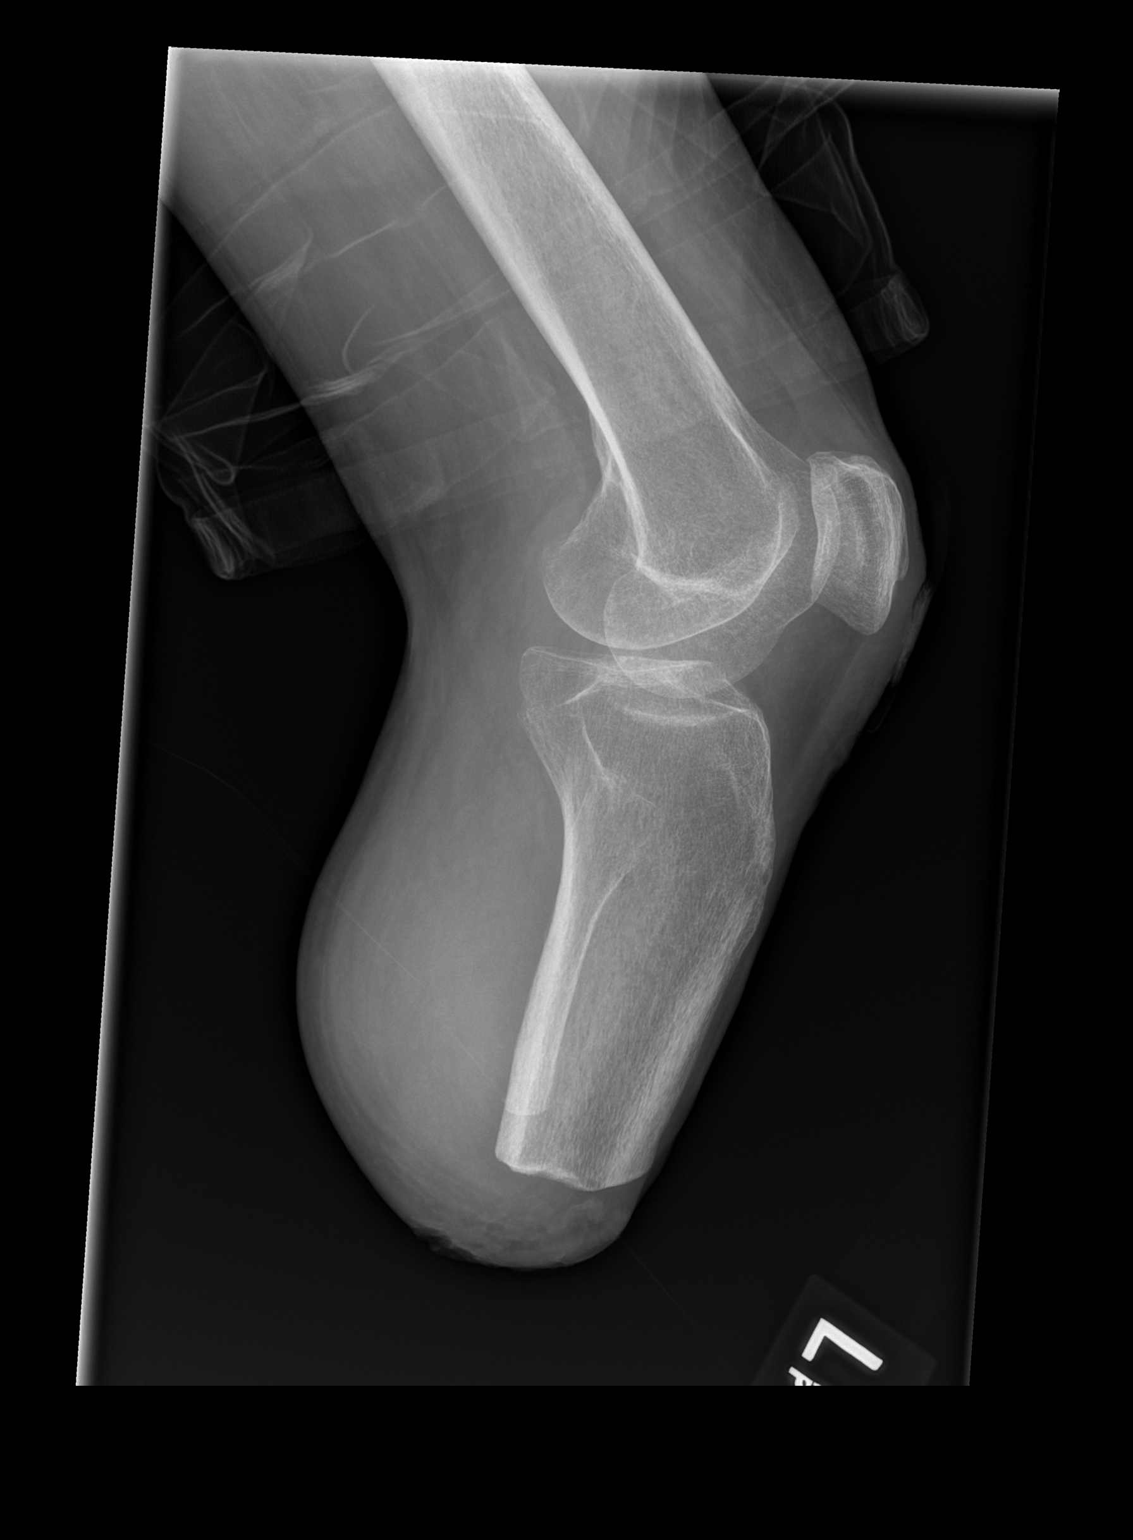

[2 of 2 positions shown; findings below may reference images not displayed]

FINDINGS: Interval BKA. Cut edges of the remnant tibia and fibula appears
smooth and without periostitis or bone destruction. No fracture.
Mild soft tissue swelling distally but no soft tissue gas.
IMPRESSION: Interval BKA.  No acute osseous abnormality

## 2019-07-15 IMAGING — CR DG TIBIA/FIBULA 2V*R*
2 series · 2 of 2 positions shown · non-contrast
Comparison: 04/08/2017

CLINICAL DATA: BKA with stump pain

EXAM:
RIGHT TIBIA AND FIBULA - 2 VIEW

[x tib-fib ap right]
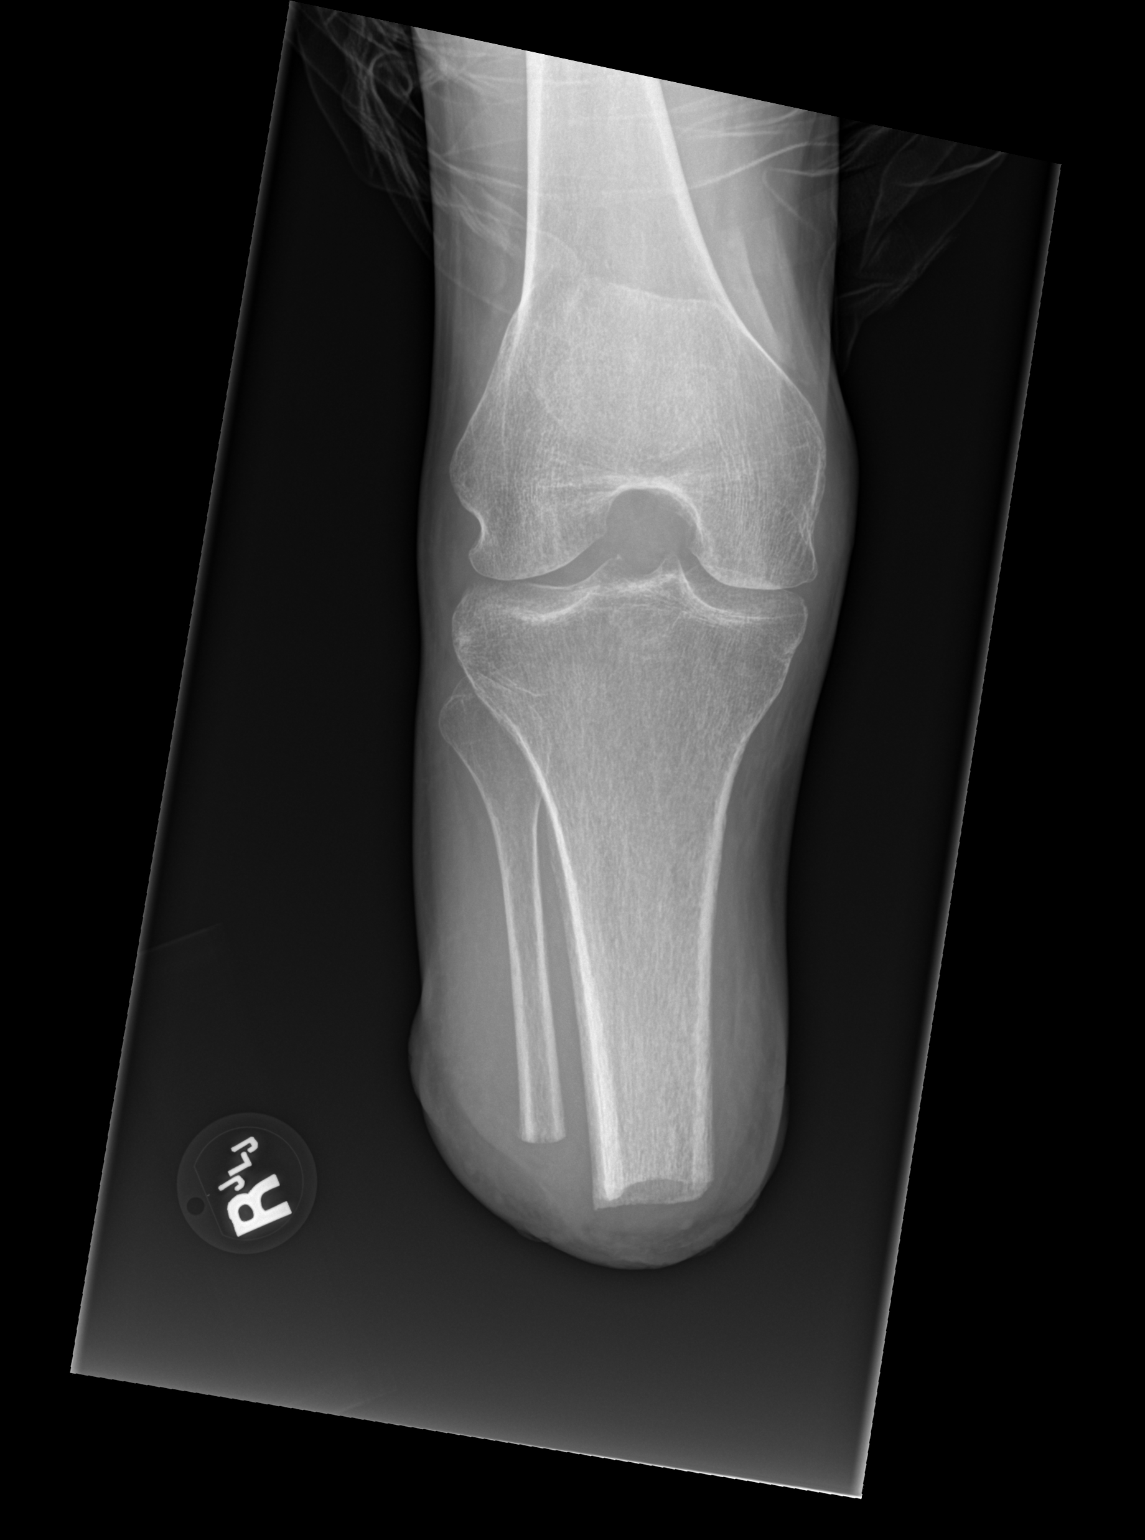

[x tib-fib lat right]
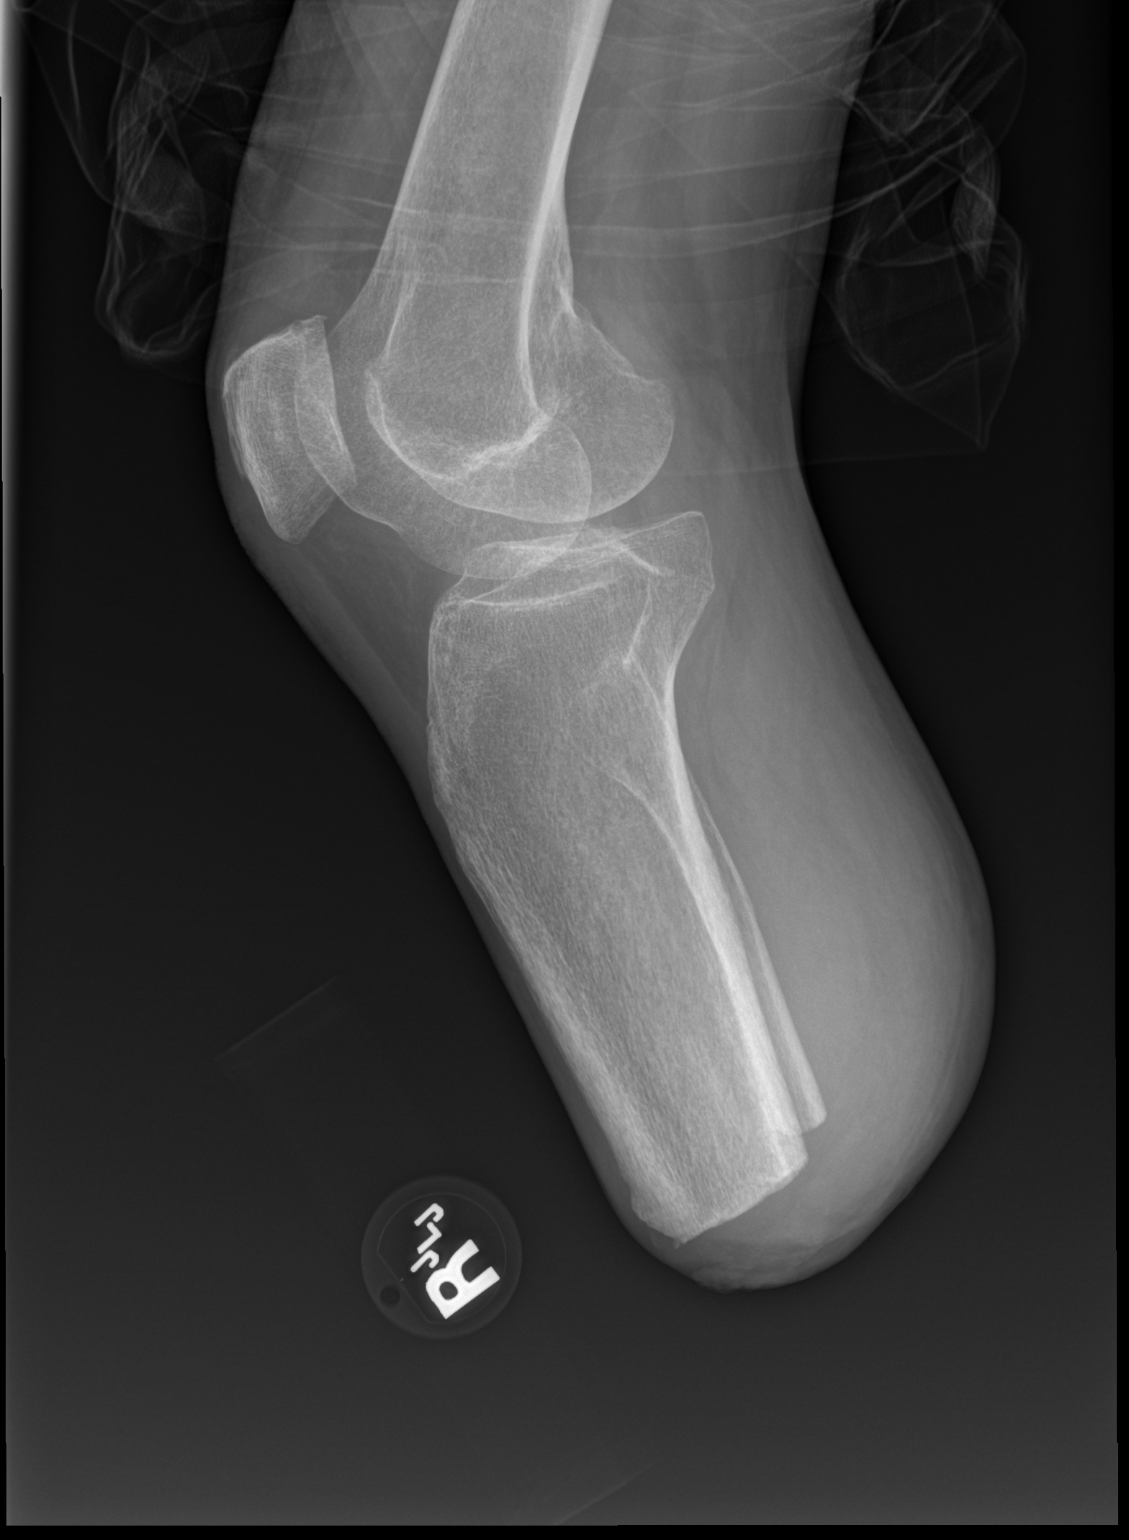

[2 of 2 positions shown; findings below may reference images not displayed]

FINDINGS: Interval BKA. Cut margins of the tibia and fibula appear smooth. No
periostitis. No bone destruction. No soft tissue gas. Mild
patellofemoral degenerative changes.
IMPRESSION: Interval BKA.  No acute osseous abnormality.

## 2019-07-15 MED ORDER — DOXYCYCLINE HYCLATE 100 MG PO CAPS: 100.0000 mg | ORAL_CAPSULE | Freq: Two times a day (BID) | ORAL | 0 refills | Status: DC

## 2019-07-15 MED ORDER — MUPIROCIN CALCIUM 2 % EX CREA
TOPICAL_CREAM | Freq: Once | CUTANEOUS | Status: AC
  Administered 2019-07-15: 11:00:00 via TOPICAL
  Filled 2019-07-15: qty 15

## 2019-07-15 MED ORDER — LIDOCAINE HCL URETHRAL/MUCOSAL 2 % EX GEL
1.0000 "application " | Freq: Once | CUTANEOUS | Status: AC
  Administered 2019-07-15: 1 via TOPICAL
  Filled 2019-07-15: qty 5

## 2019-07-15 MED ORDER — LIDOCAINE 5 % EX OINT: 1.0000 "application " | TOPICAL_OINTMENT | Freq: Four times a day (QID) | CUTANEOUS | 0 refills | Status: DC | PRN

## 2019-07-15 MED ORDER — AMLODIPINE BESYLATE 10 MG PO TABS: 10.0000 mg | ORAL_TABLET | Freq: Every day | ORAL | 0 refills | Status: DC

## 2019-07-15 MED ORDER — DOXYCYCLINE HYCLATE 100 MG PO TABS
100.0000 mg | ORAL_TABLET | Freq: Once | ORAL | Status: AC
  Administered 2019-07-15: 09:00:00 100 mg via ORAL
  Filled 2019-07-15: qty 1

## 2019-07-15 MED ORDER — AMLODIPINE BESYLATE 5 MG PO TABS
10.0000 mg | ORAL_TABLET | Freq: Once | ORAL | Status: AC
  Administered 2019-07-15: 10 mg via ORAL
  Filled 2019-07-15: qty 2

## 2019-07-15 NOTE — ED Notes (Signed)
Dr. Roxanne Mins

## 2019-07-15 NOTE — ED Notes (Signed)
Patient assisted onto the bedpan

## 2019-07-15 NOTE — ED Notes (Signed)
Patient states that her stump pain is due to the "blisters" that are there. Patient states that blisters have been present for quite some time now. Blisters noted to stumps and under arm pits. Patient states that her last time using cocaine was three or four days ago. Assisted patient with bedpan.

## 2019-07-15 NOTE — ED Notes (Signed)
[facility] has been called for transport.

## 2019-07-15 NOTE — Discharge Instructions (Addendum)
Take oral antibiotics as directed for the next week, use topical antibiotic ointment twice daily over skin bumps over the stump and in your armpits.  Use lidocaine jelly as needed over your stumps for pain.  Please begin taking her blood pressure medication again.  Monitor skin closely if worsening despite antibiotics return for reevaluation.  Follow-up with your PCP within the week for skin recheck

## 2019-07-15 NOTE — ED Notes (Signed)
PTAR has arrived.

## 2019-07-15 NOTE — ED Triage Notes (Signed)
Patient coming from home with complaints of bilateral stump pain starting last night. Scabs noted to bilateral stumps.   BP 190/120. HR 100. CBG 108.

## 2019-07-15 NOTE — ED Provider Notes (Signed)
[facility] DEPT Provider Note   CSN: [id number] Arrival date & time: 07/15/19  0445     History   Chief Complaint Chief Complaint  Patient presents with  . Stump Pain    bilateral    HPI [name] is a 58 y.o. female.     [name] is a 58 y.o. female with a history of bilateral AKA's, hypertension, migraines, vasculitis, rheumatoid arthritis, and cocaine abuse, who presents to the ED for evaluation of bilateral stump pain.  Patient reports she is forearms and in bilateral axilla.  She is squeezed some of them with a small amount of purulent drainage.  She has not noticed surrounding erythema.  She has some chronic scabs to the stumps which she reports have been there for a while and are improving.  She denies any fevers or chills.  She reports these are worsening the chronic pain over her stumps.  She has been out of her typical lidocaine jelly to use for this.  She denies any chest pain or shortness of breath, nausea, vomiting or abdominal pain.  Patient noted to be hypertensive was taken off of her blood pressure medications.  No other aggravating or alleviating factors.     Past Medical History:  Diagnosis Date  . CAP (community acquired pneumonia) 03/2014 X 2  . Cocaine abuse ([place])    ongoing with resultant vaculitis.  . Depression   . Headache    "weekly" (07/29/2016)  . Hypertension   . Inflammatory arthritis   . Migraines    "probably 5-6/yr" (07/29/2016)  . Normocytic anemia    BL Hgb 9.8-12. Last anemia panel 04/2010 - showing Fe 19, ferritin 101.  Pt on monthly B12 injections  . Rheumatoid arthritis(714.0)    patient reported  . S/P bilateral below knee amputation ([place]) 04/13/2017  . 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 Followed by Dr. Louanne Skye    Patient Active Problem List   Diagnosis Date Noted  . Disability of walking 05/28/2019  . At risk for adverse drug event  08/30/2018  . Microcytic hypochromic anemia 08/30/2018  . Thrombocytopenia ([place]) 08/20/2018  . Toxic encephalopathy 08/20/2018  . Goals of care, counseling/discussion   . Palliative care by specialist   . DNR (do not resuscitate) discussion   . Infiltrate noted on imaging study   . Ulcer of right thigh, limited to breakdown of skin ([place])   . S/P bilateral above knee amputation ([place])   . Homelessness 07/30/2018  . Elevated LFTs 07/30/2018  . Osteomyelitis ([place]) 07/30/2018  . Acute encephalopathy 07/30/2018  . Eye swelling, bilateral 07/30/2018  . Skin rash 07/11/2018  . Acute on chronic diastolic CHF (congestive heart failure) ([place]) 06/21/2018  . Malnutrition of moderate degree 05/20/2018  . Acute congestive heart failure ([place])   . Suicidal ideation 02/02/2018  . HCAP (healthcare-associated pneumonia) 02/02/2018  . Acute on chronic respiratory failure with hypoxia ([place]) 02/02/2018  . Sepsis ([place]) 02/02/2018  . Ulcer of amputation stump of lower extremity ([place]) 10/23/2017  . Cocaine abuse with cocaine-induced mood disorder ([place]) 08/10/2017  . Hypertensive crisis   . Phantom limb pain ([place])   . Tobacco abuse   . Post-operative pain   . Acute blood loss anemia   . 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 AKI (acute kidney injury) ([place]) 02/07/2017  . Acute kidney injury ([place]) 02/06/2017  .  Wound healing, delayed   . Chest pain 04/07/2015  . Protein-calorie malnutrition, severe ([place]) 01/15/2015  . MDD (major depressive disorder), recurrent episode, severe ([place]) 10/16/2014  . Cocaine use disorder, severe, dependence ([place]) 10/10/2014  . 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 Essential hypertension 02/26/2010    Past Surgical History:  Procedure Laterality Date  . AMPUTATION Left 05/22/2016   Procedure: AMPUTATION LEFT LONG FINGER;  Surgeon: Marybelle Killings, MD;  Location: [facility];  Service: Orthopedics;  Laterality: Left;   . AMPUTATION Bilateral 04/10/2017   Procedure: AMPUTATION BELOW KNEE;  Surgeon: Newt Minion, MD;  Location: [facility];  Service: Orthopedics;  Laterality: Bilateral;  . AMPUTATION Bilateral 02/06/2018   Procedure: AMPUTATION ABOVE KNEE;  Surgeon: Newt Minion, MD;  Location: [facility];  Service: Orthopedics;  Laterality: Bilateral;  . HERNIA REPAIR     "stomach"  . I&D EXTREMITY Right 09/26/2015   Procedure: IRRIGATION AND DEBRIDEMENT LEG WOUND  VAC PLACEMENT.;  Surgeon: Loel Lofty Dillingham, DO;  Location: [facility];  Service: Plastics;  Laterality: Right;  . INCISION AND DRAINAGE OF WOUND Bilateral 10/20/2016   Procedure: IRRIGATION AND DEBRIDEMENT WOUND BILATERAL;  Surgeon: Edrick Kins, DPM;  Location: [facility];  Service: Podiatry;  Laterality: Bilateral;  . IRRIGATION AND DEBRIDEMENT ABSCESS Bilateral 09/26/2013   Procedure: DEBRIDEMENT ULCERS BILATERAL THIGHS;  Surgeon: Gwenyth Ober, MD;  Location: [facility];  Service: General;  Laterality: Bilateral;  . SKIN BIOPSY Bilateral    shin nodules     OB History   No obstetric history on file.      Home Medications    Prior to Admission medications   Medication Sig Start Date End Date Taking? Authorizing Provider  clotrimazole-betamethasone (LOTRISONE) cream Apply to affected area 2 times daily prn Patient not taking: Reported on 07/15/2019 12/05/18   Orpah Greek, MD  doxycycline (VIBRAMYCIN) 100 MG capsule Take 1 capsule (100 mg total) by mouth 2 (two) times daily. Patient not taking: Reported on 07/15/2019 04/16/19   Fredia Sorrow, MD  lidocaine (XYLOCAINE) 5 % ointment Apply 1 application topically 4 (four) times daily as needed. Pain Patient not taking: Reported on 07/15/2019 12/05/18   Orpah Greek, MD  oxyCODONE (OXY IR/ROXICODONE) 5 MG immediate release tablet Take 1 tablet (5 mg total) by mouth every 4 (four) hours as needed for severe pain. Patient not taking: Reported on 07/15/2019 05/24/19   Azzie Glatter, FNP   predniSONE (DELTASONE) 10 MG tablet Take 4 tablets (40 mg total) by mouth daily. Patient not taking: Reported on 07/15/2019 04/16/19   Fredia Sorrow, MD    Family History Family History  Problem Relation Age of Onset  . Breast cancer Mother        Breast cancer  . Alcohol abuse Mother   . Colon cancer Maternal Aunt 26  . Alcohol abuse Father     Social History Social History   Tobacco Use  . Smoking status: Current Every Day Smoker    Packs/day: 0.12    Years: 38.00    Pack years: 4.56    Types: Cigarettes  . Smokeless tobacco: Never Used  . Tobacco comment: 2 A DAY  Substance Use Topics  . Alcohol use: No    Alcohol/week: 0.0 standard drinks  . Drug use: Yes    Types: "Crack" cocaine, Cocaine    Comment: Smoked crack 06/28/2019     Allergies   Levamisole, Acetaminophen, Haldol [haloperidol lactate], Lisinopril, and Toradol [ketorolac tromethamine]  Review of Systems Review of Systems  Constitutional: Negative for chills and fever.  Respiratory: Negative for cough and shortness of breath.   Cardiovascular: Negative for chest pain.  Gastrointestinal: Negative for abdominal pain, nausea and vomiting.  Genitourinary: Negative for dysuria.  Skin: Positive for color change.  Neurological: Negative for dizziness, syncope and light-headedness.  All other systems reviewed and are negative.    Physical Exam Updated Vital Signs BP (!) 145/103 (BP Location: Left Arm)   Pulse 91   Temp 97.6 F (36.4 C) (Oral)   Resp 19   Wt 45.4 kg   SpO2 99%   BMI 18.59 kg/m   Physical Exam Vitals signs and nursing note reviewed.  Constitutional:      General: She is not in acute distress.    Appearance: Normal appearance. She is well-developed and normal weight. She is not diaphoretic.     Comments: Chronically ill-appearing but in no acute distress  HENT:     Head: Normocephalic and atraumatic.  Eyes:     General:        Right eye: No discharge.        Left eye: No  discharge.  Neck:     Musculoskeletal: Neck supple.  Cardiovascular:     Rate and Rhythm: Normal rate and regular rhythm.     Heart sounds: Normal heart sounds. No murmur. No friction rub. No gallop.   Pulmonary:     Effort: Pulmonary effort is normal. No respiratory distress.     Breath sounds: Normal breath sounds.     Comments: Respirations equal and unlabored, patient able to speak in full sentences, lungs clear to auscultation bilaterally Abdominal:     General: Abdomen is flat. Bowel sounds are normal. There is no distension.     Palpations: Abdomen is soft. There is no mass.     Tenderness: There is no abdominal tenderness. There is no guarding.     Comments: Abdomen soft, nondistended, nontender to palpation in all quadrants without guarding or peritoneal signs  Musculoskeletal:     Comments: Bilateral AKA's with some small pustules scattered over both stumps, no surrounding cellulitis, no purulent drainage, there is some chronic healing scabbed over wounds present.  No skin breakdown.    Skin:    General: Skin is warm and dry.     Capillary Refill: Capillary refill takes less than 2 seconds.     Comments: Patient has a similar pustule on the left forearm and some similar lesions in bilateral axilla, no large fluctuant areas requiring drainage.  Neurological:     Mental Status: She is alert and oriented to person, place, and time.     Coordination: Coordination normal.  Psychiatric:        Mood and Affect: Mood normal.        Behavior: Behavior normal.      ED Treatments / Results  Labs (all labs ordered are listed, but only abnormal results are displayed) Labs Reviewed  CBC - Abnormal; Notable for the following components:      Result Value   Hemoglobin 10.7 (*)    HCT 35.1 (*)    MCH 25.8 (*)    RDW 16.1 (*)    All other components within normal limits  BASIC METABOLIC PANEL - Abnormal; Notable for the following components:   CO2 18 (*)    BUN 21 (*)    All  other components within normal limits    EKG EKG Interpretation  Date/Time:  Saturday  July 15 2019 07:26:07 EDT Ventricular Rate:  94 PR Interval:    QRS Duration: 90 QT Interval:  384 QTC Calculation: 481 R Axis:   95 Text Interpretation:  Sinus rhythm Biatrial enlargement Borderline right axis deviation Abnormal R-wave progression, late transition Left ventricular hypertrophy Nonspecific T abnormalities, anterior leads ST elevation, consider lateral injury Baseline wander in lead(s) I II aVR No significant change since last tracing Confirmed by Lacretia Leigh ([id number]) on 07/15/2019 8:35:29 AM   Radiology No results found.  Procedures Procedures (including critical care time)  Medications Ordered in ED Medications  lidocaine (XYLOCAINE) 2 % jelly 1 application (0 application Topical Hold 07/15/19 0832)  mupirocin cream (BACTROBAN) 2 % ( Topical Not Given 07/15/19 0828)  doxycycline (VIBRA-TABS) tablet 100 mg (100 mg Oral Given 07/15/19 0830)  amLODipine (NORVASC) tablet 10 mg (10 mg Oral Given 07/15/19 0830)     Initial Impression / Assessment and Plan / ED Course  I have reviewed the triage vital signs and the nursing notes.  Pertinent labs & imaging results that were available during my care of the patient were reviewed by me and considered in my medical decision making (see chart for details).  58 year old female with bilateral AKA's presenting with bilateral stump pain with concern for some small skin bumps, on exam there are some small pustules, no larger areas of fluctuance suggestive of abscess and no cellulitis.  She has some similar pustules on the left forearm and in bilateral axillae.  There is no skin breakdown over the stumps.  Patient noted to be hypertensive but labs and EKG suggest no evidence of endorgan damage, will start patient back on her Norvasc will treat skin infection with mupirocin ointment and oral doxycycline and I will provide lidocaine jelly for  overlying stump pain.  PCP follow-up encouraged and return precautions discussed.  Patient discharged in good condition.  Final Clinical Impressions(s) / ED Diagnoses   Final diagnoses:  Skin infection  Hypertension, unspecified type  Stump pain [facility])    ED Discharge Orders         Ordered    doxycycline (VIBRAMYCIN) 100 MG capsule  2 times daily     07/15/19 0937    lidocaine (XYLOCAINE) 5 % ointment  4 times daily PRN     07/15/19 0937    amLODipine (NORVASC) 10 MG tablet  Daily     07/15/19 0937           Jacqlyn Larsen, PA-C 07/15/19 0941    Lacretia Leigh, MD 07/16/19 [phone]

## 2019-08-15 ENCOUNTER — Other Ambulatory Visit: Payer: Self-pay | Admitting: Family Medicine

## 2019-08-15 ENCOUNTER — Telehealth: Payer: Self-pay | Admitting: Family Medicine

## 2019-08-15 DIAGNOSIS — G546 Phantom limb syndrome with pain: Secondary | ICD-10-CM

## 2019-08-15 MED ORDER — TRAMADOL HCL 50 MG PO TABS: 50.0000 mg | ORAL_TABLET | Freq: Three times a day (TID) | ORAL | 0 refills | Status: DC | PRN

## 2019-08-15 NOTE — Telephone Encounter (Signed)
Patient informed. 

## 2019-08-22 ENCOUNTER — Encounter (HOSPITAL_COMMUNITY): Payer: Self-pay

## 2019-08-22 ENCOUNTER — Other Ambulatory Visit: Payer: Self-pay

## 2019-08-22 ENCOUNTER — Emergency Department (HOSPITAL_COMMUNITY)
Admission: EM | Admit: 2019-08-22 | Discharge: 2019-08-22 | Disposition: A | Discharge status: Home / Self Care | Payer: Medicaid Other | Attending: Emergency Medicine | Admitting: Emergency Medicine

## 2019-08-22 DIAGNOSIS — Z89611 Acquired absence of right leg above knee: Secondary | ICD-10-CM | POA: Insufficient documentation

## 2019-08-22 DIAGNOSIS — J9611 Chronic respiratory failure with hypoxia: Secondary | ICD-10-CM | POA: Insufficient documentation

## 2019-08-22 DIAGNOSIS — Z590 Homelessness: Secondary | ICD-10-CM | POA: Diagnosis not present

## 2019-08-22 DIAGNOSIS — I5032 Chronic diastolic (congestive) heart failure: Secondary | ICD-10-CM | POA: Diagnosis not present

## 2019-08-22 DIAGNOSIS — M79605 Pain in left leg: Secondary | ICD-10-CM | POA: Insufficient documentation

## 2019-08-22 DIAGNOSIS — I110 Hypertensive heart disease with heart failure: Secondary | ICD-10-CM | POA: Insufficient documentation

## 2019-08-22 DIAGNOSIS — Z89512 Acquired absence of left leg below knee: Secondary | ICD-10-CM | POA: Insufficient documentation

## 2019-08-22 DIAGNOSIS — I2510 Atherosclerotic heart disease of native coronary artery without angina pectoris: Secondary | ICD-10-CM | POA: Diagnosis not present

## 2019-08-22 DIAGNOSIS — M79604 Pain in right leg: Secondary | ICD-10-CM | POA: Diagnosis not present

## 2019-08-22 DIAGNOSIS — F17210 Nicotine dependence, cigarettes, uncomplicated: Secondary | ICD-10-CM | POA: Insufficient documentation

## 2019-08-22 DIAGNOSIS — M069 Rheumatoid arthritis, unspecified: Secondary | ICD-10-CM | POA: Diagnosis not present

## 2019-08-22 MED ORDER — TRAMADOL HCL 50 MG PO TABS
50.0000 mg | ORAL_TABLET | Freq: Once | ORAL | Status: AC
  Administered 2019-08-22: 08:00:00 50 mg via ORAL
  Filled 2019-08-22: qty 1

## 2019-08-22 MED ORDER — CLOTRIMAZOLE-BETAMETHASONE 1-0.05 % EX CREA: TOPICAL_CREAM | CUTANEOUS | 0 refills | Status: DC

## 2019-08-22 MED ORDER — LIDOCAINE 5 % EX OINT: 1.0000 "application " | TOPICAL_OINTMENT | Freq: Four times a day (QID) | CUTANEOUS | 0 refills | Status: DC | PRN

## 2019-08-22 NOTE — Discharge Instructions (Signed)
You were seen in the emergency department today with pain.  I am refilling your lidocaine and rash cream.  Please begin to use these as directed.  Please call your primary care doctor to discuss your chronic pain management medications including tramadol.

## 2019-08-22 NOTE — ED Notes (Signed)
Pt provided with crackers, peanut butter and sprite.

## 2019-08-22 NOTE — ED Notes (Signed)
PT[facility] notified need for transport

## 2019-08-22 NOTE — ED Notes (Signed)
Awaiting for family to come back so the pt can get a ride home. Pt typically takes PTAR back due bilateral AKA. Will await a few minutes per pt request to await family

## 2019-08-22 NOTE — ED Triage Notes (Signed)
Pt arrives via [facility] from home with complaints of generalized pain from a skin infection. Pt seen last month for the same and provided with cream, pt reports the cream is not helping her skin. Pt denies fevers.

## 2019-08-22 NOTE — ED Notes (Signed)
Pts family back to ED to transport pt. Pt verbalizes understanding. NAD noted. Pt transported to family's car via [facility].

## 2019-08-22 NOTE — ED Provider Notes (Signed)
Emergency Department Provider Note   I have reviewed the triage vital signs and the nursing notes.   HISTORY  Chief Complaint Pain   HPI Cristina BOWLDS is a 58 y.o. female with past history reviewed below presents to the emergency department for evaluation of bilateral AKA stump pain and rash over the arms.  This has been ongoing for at least the last month.  Patient states that she was started on a cream but it did not work for her rash.  She takes tramadol for pain given to her by the MD who manages her pain. No fever, chills, or drainage. Patient tells me that she has been taking the tramadol but it is not working for her.   Past Medical History:  Diagnosis Date  . CAP (community acquired pneumonia) 03/2014 X 2  . Cocaine abuse ([place])    ongoing with resultant vaculitis.  . Depression   . Headache    "weekly" (07/29/2016)  . Hypertension   . Inflammatory arthritis   . Migraines    "probably 5-6/yr" (07/29/2016)  . Normocytic anemia    BL Hgb 9.8-12. Last anemia panel 04/2010 - showing Fe 19, ferritin 101.  Pt on monthly B12 injections  . Rheumatoid arthritis(714.0)    patient reported  . S/P bilateral below knee amputation ([place]) 04/13/2017  . 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 Followed by Dr. Louanne Skye    Patient Active Problem List   Diagnosis Date Noted  . Disability of walking 05/28/2019  . At risk for adverse drug event 08/30/2018  . Microcytic hypochromic anemia 08/30/2018  . Thrombocytopenia ([place]) 08/20/2018  . Toxic encephalopathy 08/20/2018  . Goals of care, counseling/discussion   . Palliative care by specialist   . DNR (do not resuscitate) discussion   . Infiltrate noted on imaging study   . Ulcer of right thigh, limited to breakdown of skin ([place])   . S/P bilateral above knee amputation ([place])   . Homelessness 07/30/2018  . Elevated LFTs 07/30/2018  . Osteomyelitis ([place]) 07/30/2018  . Acute  encephalopathy 07/30/2018  . Eye swelling, bilateral 07/30/2018  . Skin rash 07/11/2018  . Acute on chronic diastolic CHF (congestive heart failure) ([place]) 06/21/2018  . Malnutrition of moderate degree 05/20/2018  . Acute congestive heart failure ([place])   . Suicidal ideation 02/02/2018  . HCAP (healthcare-associated pneumonia) 02/02/2018  . Acute on chronic respiratory failure with hypoxia ([place]) 02/02/2018  . Sepsis ([place]) 02/02/2018  . Ulcer of amputation stump of lower extremity ([place]) 10/23/2017  . Cocaine abuse with cocaine-induced mood disorder ([place]) 08/10/2017  . Hypertensive crisis   . Phantom limb pain ([place])   . Tobacco abuse   . Post-operative pain   . Acute blood loss anemia   . 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 AKI (acute kidney injury) ([place]) 02/07/2017  . Acute kidney injury ([place]) 02/06/2017  . Wound healing, delayed   . Chest pain 04/07/2015  . Protein-calorie malnutrition, severe ([place]) 01/15/2015  . MDD (major depressive disorder), recurrent episode, severe ([place]) 10/16/2014  . Cocaine use disorder, severe, dependence ([place]) 10/10/2014  . 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 Essential hypertension 02/26/2010    Past Surgical History:  Procedure Laterality Date  . AMPUTATION Left 05/22/2016   Procedure: AMPUTATION LEFT [place] FINGER;  Surgeon: Marybelle Killings, MD;  Location: [facility];  Service: Orthopedics;  Laterality: Left;  .  AMPUTATION Bilateral 04/10/2017   Procedure: AMPUTATION BELOW KNEE;  Surgeon: Newt Minion, MD;  Location: [facility];  Service: Orthopedics;  Laterality: Bilateral;  . AMPUTATION Bilateral 02/06/2018   Procedure: AMPUTATION ABOVE KNEE;  Surgeon: Newt Minion, MD;  Location: [facility];  Service: Orthopedics;  Laterality: Bilateral;  . HERNIA REPAIR     "stomach"  . I&D EXTREMITY Right 09/26/2015   Procedure: IRRIGATION AND DEBRIDEMENT LEG WOUND  VAC PLACEMENT.;  Surgeon: Loel Lofty Dillingham,  DO;  Location: [facility];  Service: Plastics;  Laterality: Right;  . INCISION AND DRAINAGE OF WOUND Bilateral 10/20/2016   Procedure: IRRIGATION AND DEBRIDEMENT WOUND BILATERAL;  Surgeon: Edrick Kins, DPM;  Location: [facility];  Service: Podiatry;  Laterality: Bilateral;  . IRRIGATION AND DEBRIDEMENT ABSCESS Bilateral 09/26/2013   Procedure: DEBRIDEMENT ULCERS BILATERAL THIGHS;  Surgeon: Gwenyth Ober, MD;  Location: [facility];  Service: General;  Laterality: Bilateral;  . SKIN BIOPSY Bilateral    shin nodules    Allergies Levamisole, Acetaminophen, Haldol [haloperidol lactate], Lisinopril, and Toradol [ketorolac tromethamine]  Family History  Problem Relation Age of Onset  . Breast cancer Mother        Breast cancer  . Alcohol abuse Mother   . Colon cancer Maternal Aunt 68  . Alcohol abuse Father     Social History Social History   Tobacco Use  . Smoking status: Current Every Day Smoker    Packs/day: 0.12    Years: 38.00    Pack years: 4.56    Types: Cigarettes  . Smokeless tobacco: Never Used  . Tobacco comment: 2 A DAY  Substance Use Topics  . Alcohol use: No    Alcohol/week: 0.0 standard drinks  . Drug use: Yes    Types: "Crack" cocaine, Cocaine    Comment: 08/19/2019    Review of Systems  Constitutional: No fever/chills Eyes: No visual changes. ENT: No sore throat. Cardiovascular: Denies chest pain. Respiratory: Denies shortness of breath. Gastrointestinal: No abdominal pain.  No nausea, no vomiting.  No diarrhea.  No constipation. Genitourinary: Negative for dysuria. Musculoskeletal: Positive diffuse body pain.  Skin: Positive rash.  Neurological: Negative for headaches, focal weakness or numbness.  10-point ROS otherwise negative.  ____________________________________________   PHYSICAL EXAM:  VITAL SIGNS: ED Triage Vitals  Enc Vitals Group     BP 08/22/19 0735 (!) 168/100     Pulse Rate 08/22/19 0735 88     Resp 08/22/19 0735 18     Temp 08/22/19 0735  97.8 F (36.6 C)     Temp Source 08/22/19 0735 Oral     SpO2 08/22/19 0725 100 %     Weight 08/22/19 0736 100 lb 1.4 oz (45.4 kg)   Constitutional: Alert and oriented. Well appearing and in no acute distress. Eyes: Conjunctivae are normal. Head: Atraumatic. Nose: No congestion/rhinnorhea. Mouth/Throat: Mucous membranes are moist.   Neck: No stridor.   Cardiovascular: Normal rate, regular rhythm. Good peripheral circulation. Grossly normal heart sounds.   Respiratory: Normal respiratory effort.  No retractions. Lungs CTAB. Gastrointestinal: No distention.  Musculoskeletal: Bilateral AKAs with eschar over the right distal stump. No surrounding cellulitis or abscess.  Neurologic:  Normal speech and language.  Skin:  Skin is warm. Focal collections of dry, circular rash not consistent with abscess or cellulitis. Eschar over the distal tip of the right AKA.   ____________________________________________   PROCEDURES  Procedure(s) performed:   Procedures  None  ____________________________________________   INITIAL IMPRESSION / ASSESSMENT AND PLAN /  ED COURSE  Pertinent labs & imaging results that were available during my care of the patient were reviewed by me and considered in my medical decision making (see chart for details).   Patient presents to the ED with chronic leg pain and rash.  Patient had been compliant with Lotrisone and tramadol but stopped the cream. No evidence of abscess or cellulitis. Restarting home meds and will defer pain mgmt strategy of chronic pain to PCP.    ____________________________________________  FINAL CLINICAL IMPRESSION(S) / ED DIAGNOSES  Final diagnoses:  Leg pain, bilateral     MEDICATIONS GIVEN DURING THIS VISIT:  Medications  traMADol (ULTRAM) tablet 50 mg (50 mg Oral Given 08/22/19 0807)     Note:  This document was prepared using Dragon voice recognition software and may include unintentional dictation errors.  Nanda Quinton,  MD, [facility]    Symia Herdt, [facility], MD 08/22/19 1700

## 2019-08-25 ENCOUNTER — Other Ambulatory Visit: Payer: Self-pay

## 2019-08-25 ENCOUNTER — Ambulatory Visit (INDEPENDENT_AMBULATORY_CARE_PROVIDER_SITE_OTHER): Payer: Medicaid Other | Admitting: Family Medicine

## 2019-08-25 ENCOUNTER — Encounter: Payer: Self-pay | Admitting: Family Medicine

## 2019-08-25 VITALS — BP 147/79 | HR 86 | Temp 98.0°F

## 2019-08-25 DIAGNOSIS — R262 Difficulty in walking, not elsewhere classified: Secondary | ICD-10-CM

## 2019-08-25 DIAGNOSIS — Z89512 Acquired absence of left leg below knee: Secondary | ICD-10-CM | POA: Diagnosis not present

## 2019-08-25 DIAGNOSIS — Z09 Encounter for follow-up examination after completed treatment for conditions other than malignant neoplasm: Secondary | ICD-10-CM

## 2019-08-25 DIAGNOSIS — F418 Other specified anxiety disorders: Secondary | ICD-10-CM

## 2019-08-25 DIAGNOSIS — I10 Essential (primary) hypertension: Secondary | ICD-10-CM | POA: Diagnosis not present

## 2019-08-25 DIAGNOSIS — R739 Hyperglycemia, unspecified: Secondary | ICD-10-CM

## 2019-08-25 DIAGNOSIS — G546 Phantom limb syndrome with pain: Secondary | ICD-10-CM

## 2019-08-25 DIAGNOSIS — L089 Local infection of the skin and subcutaneous tissue, unspecified: Secondary | ICD-10-CM

## 2019-08-25 DIAGNOSIS — Z720 Tobacco use: Secondary | ICD-10-CM

## 2019-08-25 DIAGNOSIS — Z89511 Acquired absence of right leg below knee: Secondary | ICD-10-CM

## 2019-08-25 DIAGNOSIS — Z0000 Encounter for general adult medical examination without abnormal findings: Secondary | ICD-10-CM

## 2019-08-25 LAB — POCT GLYCOSYLATED HEMOGLOBIN (HGB A1C): Hemoglobin A1C: 5.1 % (ref 4.0–5.6)

## 2019-08-25 LAB — GLUCOSE, POCT (MANUAL RESULT ENTRY): POC Glucose: 128 mg/dl — AB (ref 70–99)

## 2019-08-25 MED ORDER — OXYCODONE HCL 5 MG PO TABS: 5.0000 mg | ORAL_TABLET | ORAL | 0 refills | Status: DC | PRN

## 2019-08-25 MED ORDER — DOXYCYCLINE HYCLATE 100 MG PO CAPS: 100.0000 mg | ORAL_CAPSULE | Freq: Two times a day (BID) | ORAL | 0 refills | Status: DC

## 2019-08-25 MED ORDER — AMLODIPINE BESYLATE 10 MG PO TABS: 10.0000 mg | ORAL_TABLET | Freq: Every day | ORAL | 6 refills | Status: DC

## 2019-08-25 NOTE — Progress Notes (Signed)
Patient [facility] Follow Up  Subjective:  [name], female    DOB: [birth year]  Age: 58 y.o. MRN: [id number]  CC:  Chief Complaint  Patient presents with   Hospitalization Follow-up   Rash    tender skin on stumps & elbows    HPI [name] is a 58 year old female who presents for Hospital Follow Up today.   Past Medical History:  Diagnosis Date   CAP (community acquired pneumonia) 03/2014 X 2   Cocaine abuse ([place])    ongoing with resultant vaculitis.   Depression    Headache    "weekly" (07/29/2016)   Hypertension    Inflammatory arthritis    Migraines    "probably 5-6/yr" (07/29/2016)   Normocytic anemia    BL Hgb 9.8-12. Last anemia panel 04/2010 - showing Fe 19, ferritin 101.  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 Followed by Dr. Louanne Skye   Current Status: Since her last office visit, she has had an ED visit for bilateral AKA leg pain on 08/12/2019. She had allergic reaction to Tramadol after it was prescribed at ED discharge. Today, she continues to have increased pain. She denies visual changes, chest pain, cough, shortness of breath, heart palpitations, and falls. She has occasional headaches and dizziness with position changes. Denies severe headaches, confusion, seizures, double vision, and blurred vision, nausea and vomiting. She denies fevers, chills, fatigue, recent infections, weight loss, and night sweats. She has not had any headaches, visual changes, dizziness, and falls. No chest pain, heart palpitations, cough and shortness of breath reported. No reports of GI problems such as nausea, vomiting, diarrhea, and constipation. She has no reports of blood in stools, dysuria and hematuria. Her  anxiety is moderate today, r/t her health status.  She denies suicidal ideations, homicidal ideations, or auditory hallucinations.   Past Surgical History:  Procedure Laterality Date   AMPUTATION Left 05/22/2016   Procedure: AMPUTATION LEFT LONG FINGER;  Surgeon: Marybelle Killings, MD;  Location: [place];  Service: Orthopedics;  Laterality: Left;   AMPUTATION Bilateral 04/10/2017   Procedure: AMPUTATION BELOW KNEE;  Surgeon: Newt Minion, MD;  Location: [place];  Service: Orthopedics;  Laterality: Bilateral;   AMPUTATION Bilateral 02/06/2018   Procedure: AMPUTATION ABOVE KNEE;  Surgeon: Newt Minion, MD;  Location: [place];  Service: Orthopedics;  Laterality: Bilateral;   HERNIA REPAIR     "stomach"   I&D EXTREMITY Right 09/26/2015   Procedure: IRRIGATION AND DEBRIDEMENT LEG WOUND  VAC PLACEMENT.;  Surgeon: Loel Lofty Dillingham, DO;  Location: [place];  Service: Plastics;  Laterality: Right;   INCISION AND DRAINAGE OF WOUND Bilateral 10/20/2016   Procedure: IRRIGATION AND DEBRIDEMENT WOUND BILATERAL;  Surgeon: Edrick Kins, DPM;  Location: [place];  Service: Podiatry;  Laterality: Bilateral;   IRRIGATION AND DEBRIDEMENT ABSCESS Bilateral 09/26/2013   Procedure: DEBRIDEMENT ULCERS BILATERAL THIGHS;  Surgeon: Gwenyth Ober, MD;  Location: [place];  Service: General;  Laterality: Bilateral;   SKIN BIOPSY Bilateral    shin nodules    Family History  Problem Relation Age of Onset   Breast cancer Mother        Breast cancer   Alcohol abuse Mother  Colon cancer Maternal Aunt 72   Alcohol abuse Father     Social History   Socioeconomic History   Marital status: Single    Spouse name: Not on file   Number of children: 0   Years of education: 11th grade   Highest education level: Not on file  Occupational History   Occupation: Disability    Comment: since 2011, due to her rheumatoid arthritis  Social Needs   Emergency planning/management officer strain: Not very hard   Food insecurity    Worry:  Sometimes true    Inability: Sometimes true   Transportation needs    Medical: Yes    Non-medical: Yes  Tobacco Use   Smoking status: Current Every Day Smoker    Packs/day: 0.12    Years: 38.00    Pack years: 4.56    Types: Cigarettes   Smokeless tobacco: Never Used   Tobacco comment: 2 A DAY  Substance and Sexual Activity   Alcohol use: No    Alcohol/week: 0.0 standard drinks   Drug use: Yes    Types: "Crack" cocaine, Cocaine    Comment: 08/19/2019   Sexual activity: Not Currently  Lifestyle   Physical activity    Days per week: 7 days    Minutes per session: Not on file   Stress: Very much  Relationships   Social connections    Talks on phone: More than three times a week    Gets together: More than three times a week    Attends religious service: Never    Active member of club or organization: No    Attends meetings of clubs or organizations: Never    Relationship status: Never married   Intimate partner violence    Fear of current or ex partner: Patient refused    Emotionally abused: Patient refused    Physically abused: Patient refused    Forced sexual activity: Patient refused  Other Topics Concern   Not on file  Social History Narrative   Unemployed:  cleaning in past   Living at [facility]; has Medicaid.   crack/cocaine use; pt denies IVDU   tobacco:  1/2 ppd, trying to quit   alcohol:  none        Outpatient Medications Prior to Visit  Medication Sig Dispense Refill   lidocaine (XYLOCAINE) 5 % ointment Apply 1 application topically 4 (four) times daily as needed. Pain 35.44 g 0   amLODipine (NORVASC) 10 MG tablet Take 1 tablet (10 mg total) by mouth daily. 30 tablet 0   clotrimazole-betamethasone (LOTRISONE) cream Apply to affected area 2 times daily prn for 21 days (Patient not taking: Reported on 08/25/2019) 45 g 0   doxycycline (VIBRAMYCIN) 100 MG capsule Take 1 capsule (100 mg total) by mouth 2 (two) times daily. One po bid x 7  days 14 capsule 0   oxyCODONE (OXY IR/ROXICODONE) 5 MG immediate release tablet Take 1 tablet (5 mg total) by mouth every 4 (four) hours as needed for severe pain. (Patient not taking: Reported on 07/15/2019) 30 tablet 0   predniSONE (DELTASONE) 10 MG tablet Take 4 tablets (40 mg total) by mouth daily. 20 tablet 0   traMADol (ULTRAM) 50 MG tablet Take 1 tablet (50 mg total) by mouth every 8 (eight) hours as needed for up to 10 days. 30 tablet 0   No facility-administered medications prior to visit.     Allergies  Allergen Reactions   Levamisole     Levamisole is a chemical  adulterant of cocaine which can cause cutaneous necrosis It is used as an Health and safety inspector in veterinary medicine   Acetaminophen Swelling and Other (See Comments)    Reaction:  Eyelid swelling   Haldol [Haloperidol Lactate]     Possible eye swelling was given together with Toradol unsure if caused the reaction   Lisinopril     UNSPECIFIED REACTION    Toradol [Ketorolac Tromethamine]     Eye swelling   Tramadol Rash    ROS Review of Systems  Constitutional: Negative.   HENT: Negative.   Eyes: Negative.   Respiratory: Negative.   Cardiovascular: Negative.   Gastrointestinal: Negative.   Genitourinary: Negative.   Musculoskeletal: Negative.        Bilateral AKA.  Skin: Negative.   Allergic/Immunologic: Negative.   Neurological: Negative.   Hematological: Negative.   Psychiatric/Behavioral: The patient is nervous/anxious.       Objective:    Physical Exam  Constitutional: She appears well-developed and well-nourished.  HENT:  Head: Normocephalic and atraumatic.  Eyes: Conjunctivae are normal.  Neck: Normal range of motion. Neck supple.  Cardiovascular: Normal rate, regular rhythm, normal heart sounds and intact distal pulses.  Pulmonary/Chest: Effort normal and breath sounds normal.  Abdominal: Soft. Bowel sounds are normal.  Genitourinary:    Uterus normal.   Musculoskeletal:      Comments: Bilateral AKA. Phantom pain.  Skin: Rash (left axilla) noted. There is erythema (left axilla).  Rash on left AKA stump, r/t recent allergic reaction to Tramadol.  Psychiatric:  Tearful, because of healthcare status.   Nursing note and vitals reviewed.   BP (!) 147/79    Pulse 86    Temp 98 F (36.7 C) (Oral)    SpO2 98%  Wt Readings from Last 3 Encounters:  08/22/19 100 lb 1.4 oz (45.4 kg)  07/15/19 100 lb (45.4 kg)  04/16/19 100 lb (45.4 kg)     Health Maintenance Due  Topic Date Due   COLONOSCOPY  04/17/2013   PAP SMEAR-Modifier  09/18/2013   INFLUENZA VACCINE  05/06/2019    There are no preventive care reminders to display for this patient.  Lab Results  Component Value Date   TSH 1.650 07/31/2018   Lab Results  Component Value Date   WBC 5.6 07/15/2019   HGB 10.7 (L) 07/15/2019   HCT 35.1 (L) 07/15/2019   MCV 84.6 07/15/2019   PLT 275 07/15/2019   Lab Results  Component Value Date   NA 135 07/15/2019   K 3.7 07/15/2019   CO2 18 (L) 07/15/2019   GLUCOSE 82 07/15/2019   BUN 21 (H) 07/15/2019   CREATININE 0.76 07/15/2019   BILITOT 0.7 04/16/2019   ALKPHOS 85 04/16/2019   AST 23 04/16/2019   ALT 14 04/16/2019   PROT 7.7 04/16/2019   ALBUMIN 3.8 04/16/2019   CALCIUM 9.0 07/15/2019   ANIONGAP 11 07/15/2019   Lab Results  Component Value Date   CHOL 164 05/20/2018   Lab Results  Component Value Date   HDL 40 (L) 05/20/2018   Lab Results  Component Value Date   LDLCALC 116 (H) 05/20/2018   Lab Results  Component Value Date   TRIG 40 05/20/2018   Lab Results  Component Value Date   CHOLHDL 4.1 05/20/2018   Lab Results  Component Value Date   HGBA1C 5.1 08/25/2019      Assessment & Plan:   1. Essential hypertension The current medical regimen is effective; blood pressure is stable at 147/79 today;  continue present plan and medications as prescribed. She will continue to take medications as prescribed, to decrease high sodium  intake, excessive alcohol intake, increase potassium intake, smoking cessation, and increase physical activity of at least 30 minutes of cardio activity daily. She will continue to follow Heart Healthy or DASH diet. - amLODipine (NORVASC) 10 MG tablet; Take 1 tablet (10 mg total) by mouth daily.  Dispense: 30 tablet; Refill: 6  2. Disability of walking Recent bilateral AKA.  3. S/P bilateral below knee amputation ([place]) We will initiate Oxycodone today.  - oxyCODONE (OXY IR/ROXICODONE) 5 MG immediate release tablet; Take 1 tablet (5 mg total) by mouth every 4 (four) hours as needed for severe pain.  Dispense: 30 tablet; Refill: 0  4. Phantom limb pain (HCC) - oxyCODONE (OXY IR/ROXICODONE) 5 MG immediate release tablet; Take 1 tablet (5 mg total) by mouth every 4 (four) hours as needed for severe pain.  Dispense: 30 tablet; Refill: 0  5. Depression with anxiety Moderate today.   6. Tobacco abuse  7. Local skin infection Ax[place]. We will initiate antibiotic today.  - doxycycline (VIBRAMYCIN) 100 MG capsule; Take 1 capsule (100 mg total) by mouth 2 (two) times daily. One po bid x 7 days  Dispense: 14 capsule; Refill: 0  8. Follow up She will follow up in 6 months.   Meds ordered this encounter  Medications   amLODipine (NORVASC) 10 MG tablet    Sig: Take 1 tablet (10 mg total) by mouth daily.    Dispense:  30 tablet    Refill:  6   oxyCODONE (OXY IR/ROXICODONE) 5 MG immediate release tablet    Sig: Take 1 tablet (5 mg total) by mouth every 4 (four) hours as needed for severe pain.    Dispense:  30 tablet    Refill:  0   doxycycline (VIBRAMYCIN) 100 MG capsule    Sig: Take 1 capsule (100 mg total) by mouth 2 (two) times daily. One po bid x 7 days    Dispense:  14 capsule    Refill:  0    No orders of the defined types were placed in this encounter.   Referral Orders  No referral(s) requested today    Kathe Becton,  MSN, FNP-BC [address], [place] [address] [phone] fax  Problem List Items Addressed This Visit      Cardiovascular and Mediastinum   Essential hypertension - Primary (Chronic)   Relevant Medications   amLODipine (NORVASC) 10 MG tablet     Nervous and Auditory   Phantom limb pain (HCC)   Relevant Medications   oxyCODONE (OXY IR/ROXICODONE) 5 MG immediate release tablet     Other   Disability of walking   Tobacco abuse    Other Visit Diagnoses    S/P bilateral below knee amputation (HCC)       Relevant Medications   oxyCODONE (OXY IR/ROXICODONE) 5 MG immediate release tablet   Depression with anxiety       Local skin infection       Relevant Medications   doxycycline (VIBRAMYCIN) 100 MG capsule   Follow up          Meds ordered this encounter  Medications   amLODipine (NORVASC) 10 MG tablet    Sig: Take 1 tablet (10 mg total) by mouth daily.    Dispense:  30 tablet    Refill:  6  oxyCODONE (OXY IR/ROXICODONE) 5 MG immediate release tablet    Sig: Take 1 tablet (5 mg total) by mouth every 4 (four) hours as needed for severe pain.    Dispense:  30 tablet    Refill:  0   doxycycline (VIBRAMYCIN) 100 MG capsule    Sig: Take 1 capsule (100 mg total) by mouth 2 (two) times daily. One po bid x 7 days    Dispense:  14 capsule    Refill:  0    Follow-up: Return in about 6 months (around 02/22/2020).    Azzie Glatter, FNP

## 2019-08-27 DIAGNOSIS — R739 Hyperglycemia, unspecified: Secondary | ICD-10-CM | POA: Insufficient documentation

## 2019-08-29 IMAGING — DX DG CHEST 2V
2 series · 2 of 2 positions shown · non-contrast
Comparison: Chest radiograph performed 07/19/2017, and CTA of the
chest performed 07/04/2017

CLINICAL DATA: Acute onset of central chest pain, cough and high
blood pressure.

EXAM:
CHEST  2 VIEW

[chest lat]
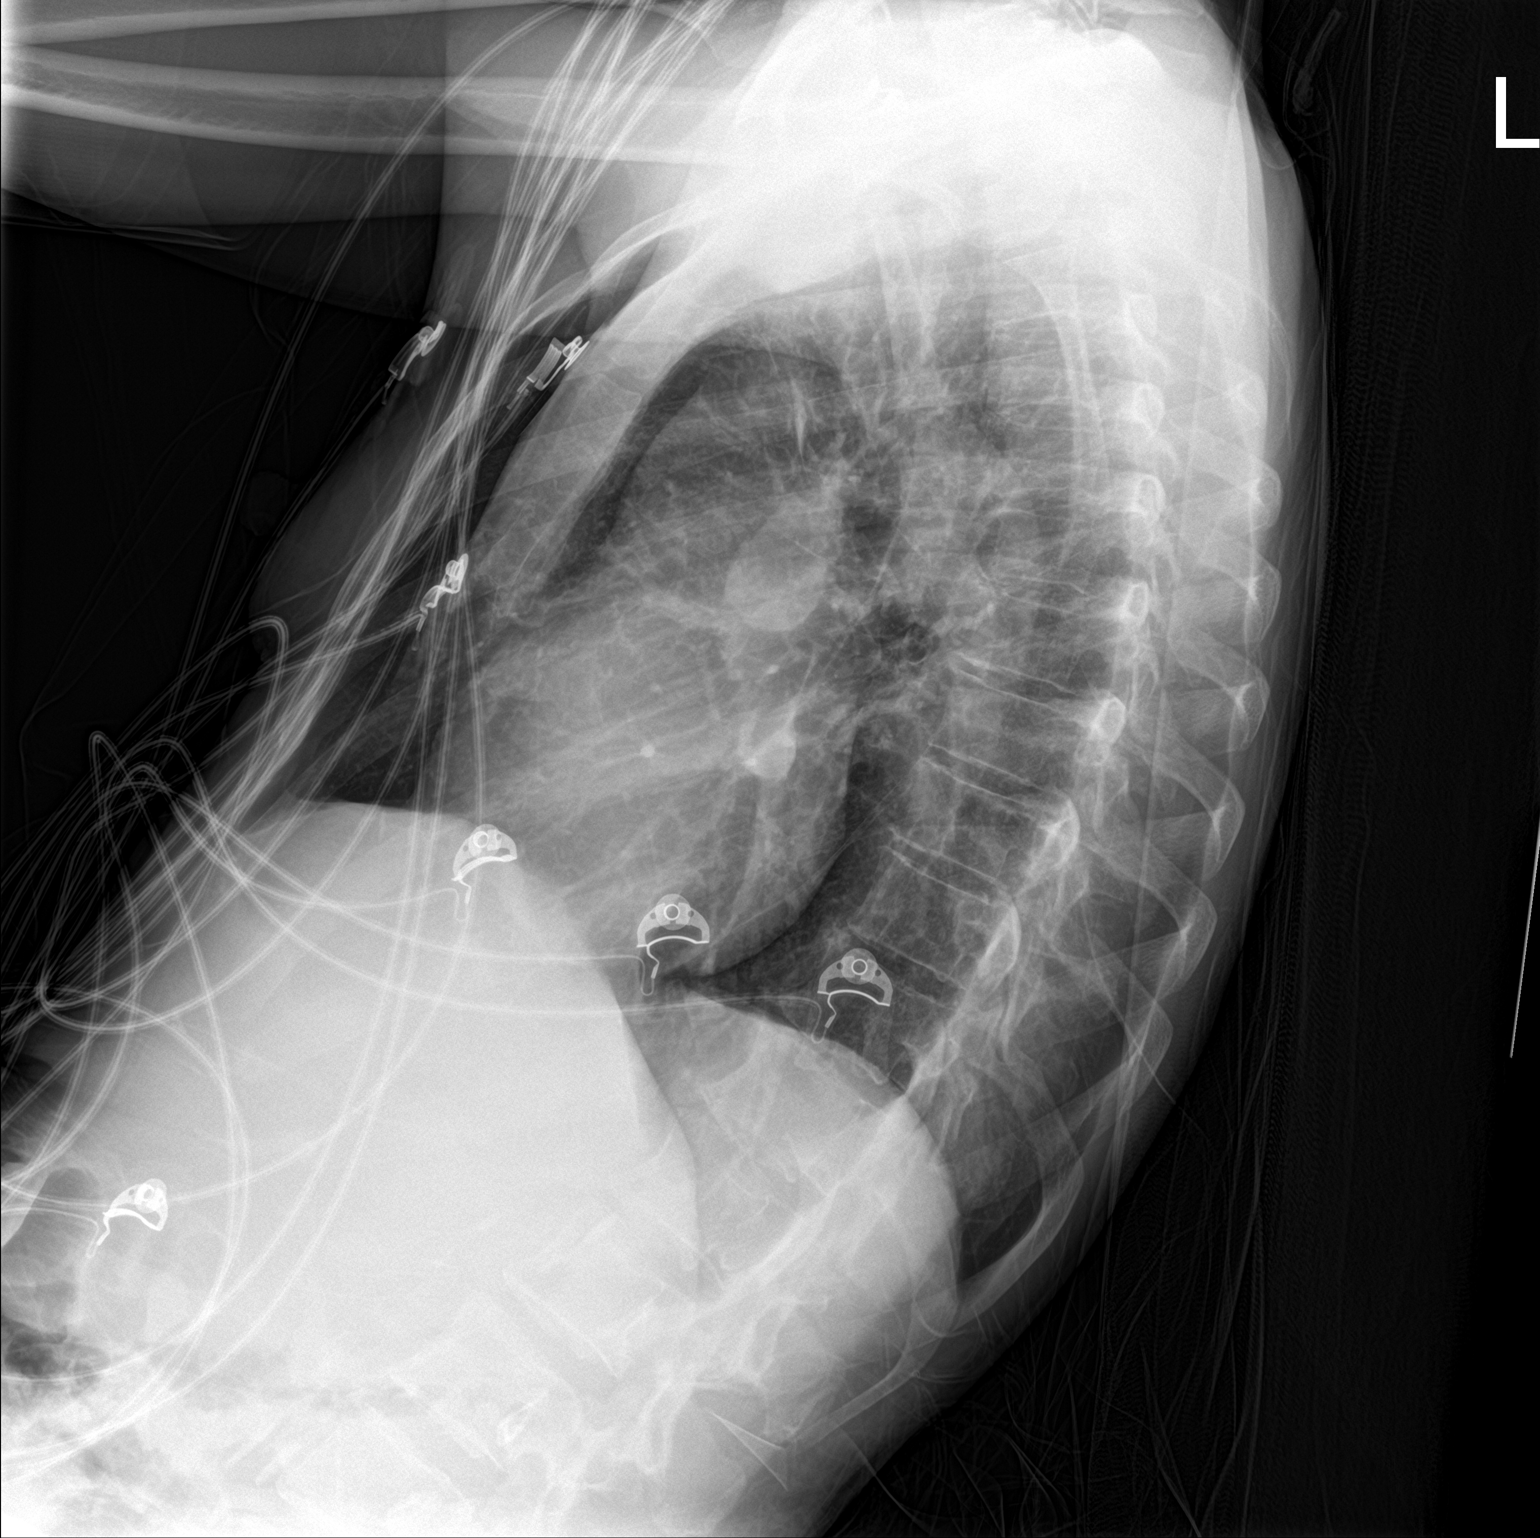

[chest ap]
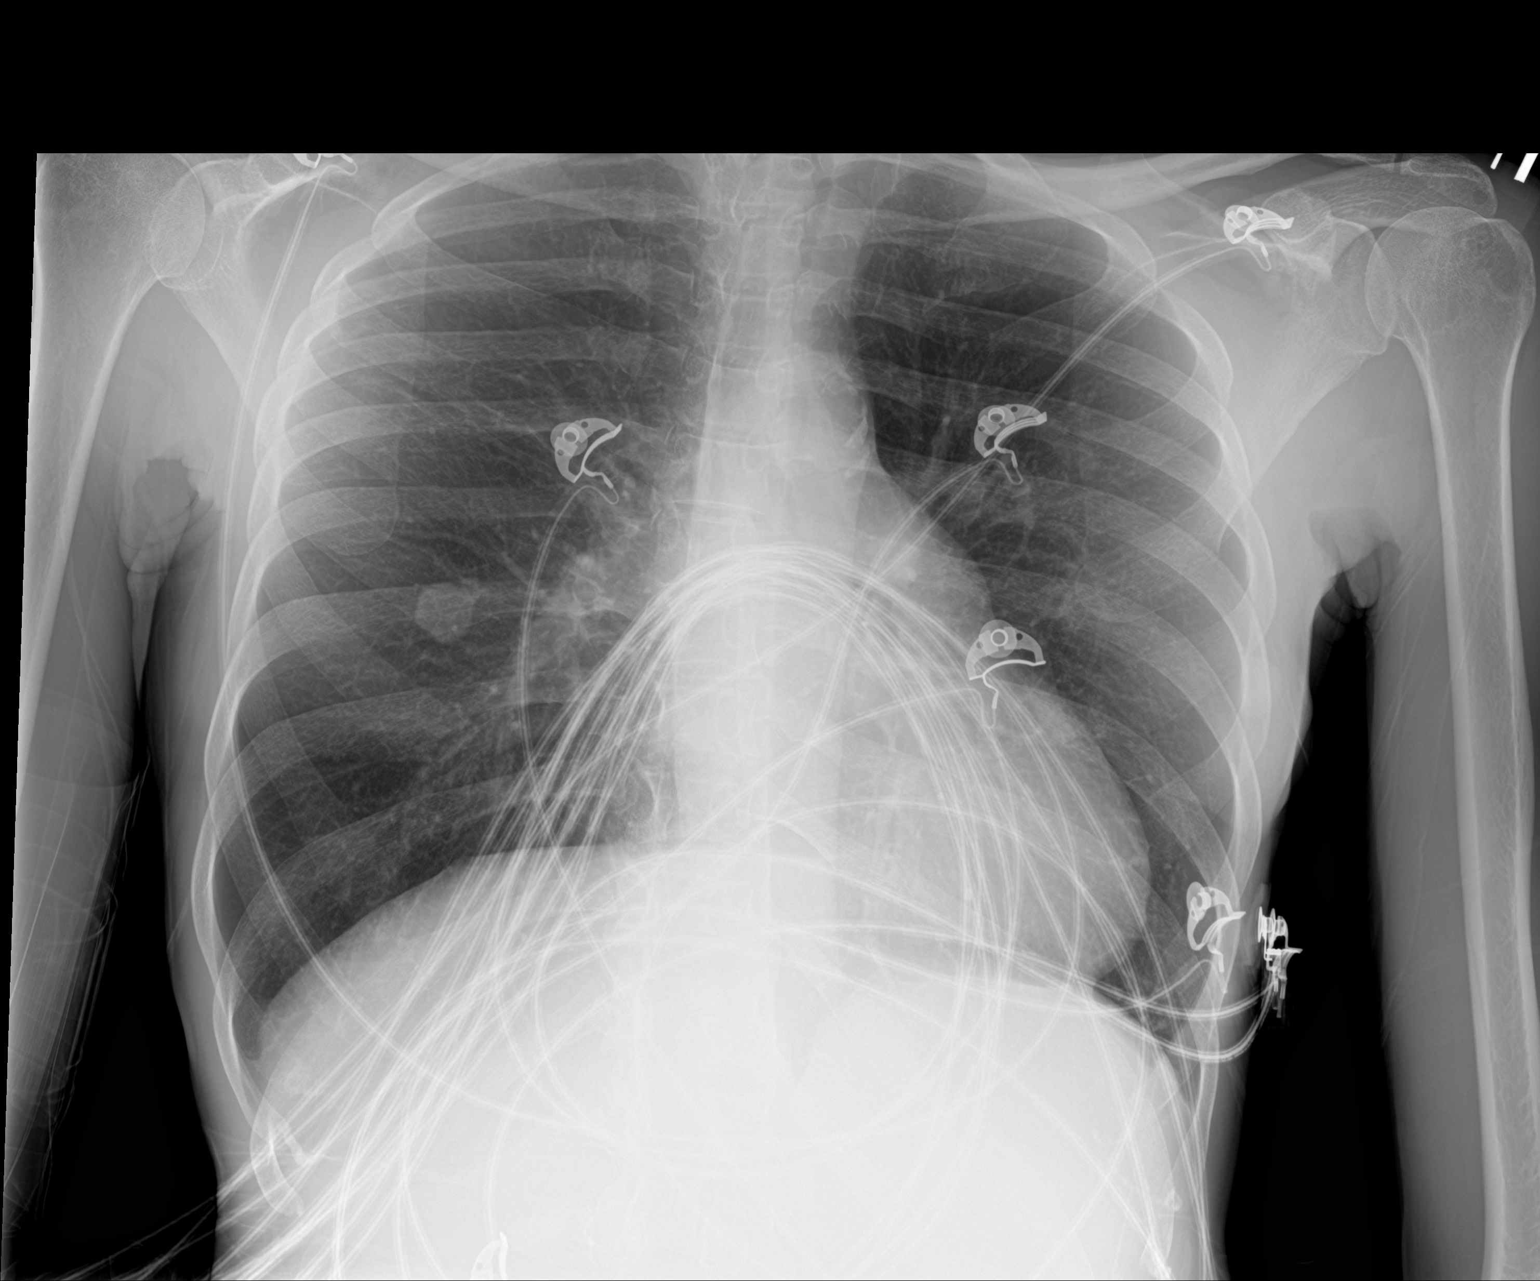

[2 of 2 positions shown; findings below may reference images not displayed]

FINDINGS: The lungs are well-aerated. The patient's right-sided nipple shadow
is again noted. There is no evidence of focal opacification, pleural
effusion or pneumothorax.

The heart is normal in size; the mediastinal contour is within
normal limits. No acute osseous abnormalities are seen.
IMPRESSION: No acute cardiopulmonary process seen.

## 2019-09-03 ENCOUNTER — Emergency Department (HOSPITAL_COMMUNITY)
Admission: EM | Admit: 2019-09-03 | Discharge: 2019-09-03 | Disposition: A | Discharge status: Home / Self Care | Payer: Medicaid Other | Attending: Emergency Medicine | Admitting: Emergency Medicine

## 2019-09-03 ENCOUNTER — Other Ambulatory Visit: Payer: Self-pay

## 2019-09-03 DIAGNOSIS — Z89611 Acquired absence of right leg above knee: Secondary | ICD-10-CM | POA: Diagnosis not present

## 2019-09-03 DIAGNOSIS — F17210 Nicotine dependence, cigarettes, uncomplicated: Secondary | ICD-10-CM | POA: Diagnosis not present

## 2019-09-03 DIAGNOSIS — Z89022 Acquired absence of left finger(s): Secondary | ICD-10-CM | POA: Insufficient documentation

## 2019-09-03 DIAGNOSIS — I5032 Chronic diastolic (congestive) heart failure: Secondary | ICD-10-CM | POA: Diagnosis not present

## 2019-09-03 DIAGNOSIS — I110 Hypertensive heart disease with heart failure: Secondary | ICD-10-CM | POA: Insufficient documentation

## 2019-09-03 DIAGNOSIS — I10 Essential (primary) hypertension: Secondary | ICD-10-CM | POA: Insufficient documentation

## 2019-09-03 DIAGNOSIS — Z89612 Acquired absence of left leg above knee: Secondary | ICD-10-CM | POA: Insufficient documentation

## 2019-09-03 DIAGNOSIS — G546 Phantom limb syndrome with pain: Secondary | ICD-10-CM

## 2019-09-03 DIAGNOSIS — Z79899 Other long term (current) drug therapy: Secondary | ICD-10-CM | POA: Diagnosis not present

## 2019-09-03 DIAGNOSIS — T8789 Other complications of amputation stump: Secondary | ICD-10-CM | POA: Diagnosis not present

## 2019-09-03 DIAGNOSIS — Y658 Other specified misadventures during surgical and medical care: Secondary | ICD-10-CM | POA: Diagnosis not present

## 2019-09-03 DIAGNOSIS — M79609 Pain in unspecified limb: Secondary | ICD-10-CM

## 2019-09-03 DIAGNOSIS — Z89511 Acquired absence of right leg below knee: Secondary | ICD-10-CM

## 2019-09-03 DIAGNOSIS — M79604 Pain in right leg: Secondary | ICD-10-CM | POA: Diagnosis present

## 2019-09-03 MED ORDER — OXYCODONE HCL 5 MG PO TABS
5.0000 mg | ORAL_TABLET | Freq: Once | ORAL | Status: AC
  Administered 2019-09-03: 05:00:00 5 mg via ORAL
  Filled 2019-09-03: qty 1

## 2019-09-03 MED ORDER — OXYCODONE HCL 5 MG PO TABS: 5.0000 mg | ORAL_TABLET | ORAL | 0 refills | Status: DC | PRN

## 2019-09-03 NOTE — ED Triage Notes (Signed)
Per EMS: Pt is coming from home with c/o pain. Pt is a bilateral amputee that has a recurrent wound on her right leg that opens frequently. The wound has a small amount of drainage coming from it. Pt reports that she was prescribed antibiotics and pain medication for her wound. She recently ran out of her pain medication two days ago and her pain has become increasingly worse. No fever.  EMS VITALS: BP 170/110 HR 90 SPO2 99 RA CBG 130

## 2019-09-03 NOTE — ED Notes (Signed)
[facility] called for transportation. Paperwork at nurses station.

## 2019-09-03 NOTE — ED Provider Notes (Signed)
[facility] DEPT Provider Note   CSN: [id number] Arrival date & time: 09/03/19  0416     History   Chief Complaint No chief complaint on file.   HPI [name] is a 58 y.o. female.     Patient to ED with complaint of open sore that is painful, "burning" to right AKA stump. This is a chronic issue for her. She states last prescribed antibiotics and pain medication to treat. She ran out of her pain medication 2 days ago. No fever. No other symptoms.   The history is provided by the patient. No language interpreter was used.    Past Medical History:  Diagnosis Date  . CAP (community acquired pneumonia) 03/2014 X 2  . Cocaine abuse ([place])    ongoing with resultant vaculitis.  . Depression   . Headache    "weekly" (07/29/2016)  . Hypertension   . Inflammatory arthritis   . Migraines    "probably 5-6/yr" (07/29/2016)  . Normocytic anemia    BL Hgb 9.8-12. Last anemia panel 04/2010 - showing Fe 19, ferritin 101.  Pt on monthly B12 injections  . Rheumatoid arthritis(714.0)    patient reported  . S/P bilateral below knee amputation ([place]) 04/13/2017  . 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 Followed by Dr. Louanne Skye    Patient Active Problem List   Diagnosis Date Noted  . Hyperglycemia 08/27/2019  . Disability of walking 05/28/2019  . At risk for adverse drug event 08/30/2018  . Microcytic hypochromic anemia 08/30/2018  . Thrombocytopenia ([place]) 08/20/2018  . Toxic encephalopathy 08/20/2018  . Goals of care, counseling/discussion   . Palliative care by specialist   . DNR (do not resuscitate) discussion   . Infiltrate noted on imaging study   . Ulcer of right thigh, limited to breakdown of skin ([place])   . S/P bilateral above knee amputation ([place])   . Homelessness 07/30/2018  . Elevated LFTs 07/30/2018  . Osteomyelitis ([place]) 07/30/2018  . Acute encephalopathy 07/30/2018  . Eye swelling,  bilateral 07/30/2018  . Skin rash 07/11/2018  . Acute on chronic diastolic CHF (congestive heart failure) ([place]) 06/21/2018  . Malnutrition of moderate degree 05/20/2018  . Acute congestive heart failure ([place])   . Suicidal ideation 02/02/2018  . HCAP (healthcare-associated pneumonia) 02/02/2018  . Acute on chronic respiratory failure with hypoxia ([place]) 02/02/2018  . Sepsis ([place]) 02/02/2018  . Ulcer of amputation stump of lower extremity ([place]) 10/23/2017  . 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 Hypertensive crisis   . Phantom limb pain ([place])   . Tobacco abuse   . Post-operative pain   . Acute blood loss anemia   . 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 AKI (acute kidney injury) ([place]) 02/07/2017  . Acute kidney injury ([place]) 02/06/2017  . Wound healing, delayed   . Chest pain 04/07/2015  . Protein-calorie malnutrition, severe ([place]) 01/15/2015  . MDD (major depressive disorder), recurrent episode, severe ([place]) 10/16/2014  . Cocaine use disorder, severe, dependence ([place]) 10/10/2014  . 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 Essential hypertension 02/26/2010    Past Surgical History:  Procedure Laterality Date  . AMPUTATION Left 05/22/2016   Procedure: AMPUTATION LEFT LONG FINGER;  Surgeon: Marybelle Killings, MD;  Location: [place];  Service: Orthopedics;  Laterality: Left;  . AMPUTATION Bilateral 04/10/2017   Procedure: AMPUTATION BELOW KNEE;  Surgeon:  Newt Minion, MD;  Location: [place];  Service: Orthopedics;  Laterality: Bilateral;  . AMPUTATION Bilateral 02/06/2018   Procedure: AMPUTATION ABOVE KNEE;  Surgeon: Newt Minion, MD;  Location: [place];  Service: Orthopedics;  Laterality: Bilateral;  . HERNIA REPAIR     "stomach"  . I&D EXTREMITY Right 09/26/2015   Procedure: IRRIGATION AND DEBRIDEMENT LEG WOUND  VAC PLACEMENT.;  Surgeon: Loel Lofty Dillingham, DO;  Location: [place];  Service: Plastics;   Laterality: Right;  . INCISION AND DRAINAGE OF WOUND Bilateral 10/20/2016   Procedure: IRRIGATION AND DEBRIDEMENT WOUND BILATERAL;  Surgeon: Edrick Kins, DPM;  Location: [place];  Service: Podiatry;  Laterality: Bilateral;  . IRRIGATION AND DEBRIDEMENT ABSCESS Bilateral 09/26/2013   Procedure: DEBRIDEMENT ULCERS BILATERAL THIGHS;  Surgeon: Gwenyth Ober, MD;  Location: [place];  Service: General;  Laterality: Bilateral;  . SKIN BIOPSY Bilateral    shin nodules     OB History    Gravida  0   Para  0   Term  0   Preterm  0   AB  0   Living  0     SAB  0   TAB  0   Ectopic  0   Multiple  0   Live Births  0            Home Medications    Prior to Admission medications   Medication Sig Start Date End Date Taking? Authorizing Provider  amLODipine (NORVASC) 10 MG tablet Take 1 tablet (10 mg total) by mouth daily. 08/25/19   Azzie Glatter, FNP  clotrimazole-betamethasone (LOTRISONE) cream Apply to affected area 2 times daily prn for 21 days Patient not taking: Reported on 08/25/2019 08/22/19   Long, Wonda Olds, MD  doxycycline (VIBRAMYCIN) 100 MG capsule Take 1 capsule (100 mg total) by mouth 2 (two) times daily. One po bid x 7 days 08/25/19   Azzie Glatter, FNP  lidocaine (XYLOCAINE) 5 % ointment Apply 1 application topically 4 (four) times daily as needed. Pain 08/22/19   Long, Wonda Olds, MD  oxyCODONE (OXY IR/ROXICODONE) 5 MG immediate release tablet Take 1 tablet (5 mg total) by mouth every 4 (four) hours as needed for severe pain. 08/25/19   Azzie Glatter, FNP    Family History Family History  Problem Relation Age of Onset  . Breast cancer Mother        Breast cancer  . Alcohol abuse Mother   . Colon cancer Maternal Aunt 56  . Alcohol abuse Father     Social History Social History   Tobacco Use  . Smoking status: Current Every Day Smoker    Packs/day: 0.12    Years: 38.00    Pack years: 4.56    Types: Cigarettes  . Smokeless tobacco: Never Used   . Tobacco comment: 2 A DAY  Substance Use Topics  . Alcohol use: No    Alcohol/week: 0.0 standard drinks  . Drug use: Yes    Types: "Crack" cocaine, Cocaine    Comment: 08/19/2019     Allergies   Levamisole, Acetaminophen, Haldol [haloperidol lactate], Lisinopril, Toradol [ketorolac tromethamine], and Tramadol   Review of Systems Review of Systems  Constitutional: Negative for fever.  Musculoskeletal:       See HPI.  Skin: Positive for wound. Negative for color change.     Physical Exam Updated Vital Signs BP (!) 139/102 (BP Location: Right Arm)   Pulse 91   Temp 98 F (  36.7 C) (Oral)   Resp 20   SpO2 100%   Physical Exam Vitals signs and nursing note reviewed.  Constitutional:      Comments: Chronically ill appearing. Tearful.  Cardiovascular:     Rate and Rhythm: Normal rate.  Pulmonary:     Effort: Pulmonary effort is normal.  Musculoskeletal:     Comments: Bilateral AKA.  Skin:    General: Skin is warm and dry.     Comments: Quarter sized, partially scabbed wound to terminal right AKA stump. No induration, redness, purulent drainage or warmth. No necrosis.   Neurological:     Mental Status: She is alert and oriented to person, place, and time.      ED Treatments / Results  Labs (all labs ordered are listed, but only abnormal results are displayed) Labs Reviewed - No data to display  EKG None  Radiology No results found.  Procedures Procedures (including critical care time)  Medications Ordered in ED Medications  oxyCODONE (Oxy IR/ROXICODONE) immediate release tablet 5 mg (5 mg Oral Given 09/03/19 0515)     Initial Impression / Assessment and Plan / ED Course  I have reviewed the triage vital signs and the nursing notes.  Pertinent labs & imaging results that were available during my care of the patient were reviewed by me and considered in my medical decision making (see chart for details).        Patient to ED with complaint of  burning type pain in right AKA stump. Reports open wound.   On exam, there is a partially scabbed wound without evidence of infection. Per chart, she finished a 7-day Doxycycline Rx 2 days ago for a skin infection. Do not feel additional antibiotics are needed today.   The patient is out of her pain medication. Will Rx #5 to get her through today and encourage her to see her doctor in the office tomorrow for further pain management.   Final Clinical Impressions(s) / ED Diagnoses   Final diagnoses:  None   1. Right stump pain  ED Discharge Orders    None       Charlann Lange, PA-C 09/03/19 0542    Molpus, Jenny Reichmann, MD 09/03/19 [phone]

## 2019-09-03 NOTE — ED Notes (Signed)
Patient was verbalized discharge instructions. Pt had no further questions at this time. NAD. 

## 2019-09-03 NOTE — ED Notes (Signed)
[facility] at bedside at this time to transport pt back to residence.

## 2019-09-03 NOTE — Discharge Instructions (Addendum)
You have enough pain medication for today. Please call your doctor tomorrow for further pain management.

## 2019-09-08 ENCOUNTER — Encounter (HOSPITAL_COMMUNITY): Payer: Self-pay

## 2019-09-08 ENCOUNTER — Emergency Department (HOSPITAL_COMMUNITY)
Admission: EM | Admit: 2019-09-08 | Discharge: 2019-09-08 | Disposition: A | Discharge status: Home / Self Care | Payer: Medicaid Other | Attending: Emergency Medicine | Admitting: Emergency Medicine

## 2019-09-08 ENCOUNTER — Other Ambulatory Visit: Payer: Self-pay

## 2019-09-08 DIAGNOSIS — F17210 Nicotine dependence, cigarettes, uncomplicated: Secondary | ICD-10-CM | POA: Insufficient documentation

## 2019-09-08 DIAGNOSIS — I110 Hypertensive heart disease with heart failure: Secondary | ICD-10-CM | POA: Diagnosis not present

## 2019-09-08 DIAGNOSIS — Z79899 Other long term (current) drug therapy: Secondary | ICD-10-CM | POA: Insufficient documentation

## 2019-09-08 DIAGNOSIS — Z89611 Acquired absence of right leg above knee: Secondary | ICD-10-CM | POA: Diagnosis not present

## 2019-09-08 DIAGNOSIS — T8789 Other complications of amputation stump: Secondary | ICD-10-CM

## 2019-09-08 DIAGNOSIS — M79604 Pain in right leg: Secondary | ICD-10-CM | POA: Diagnosis present

## 2019-09-08 DIAGNOSIS — I5032 Chronic diastolic (congestive) heart failure: Secondary | ICD-10-CM | POA: Diagnosis not present

## 2019-09-08 LAB — COMPREHENSIVE METABOLIC PANEL
ALT: 16 U/L (ref 0–44)
AST: 21 U/L (ref 15–41)
Albumin: 3.2 g/dL — ABNORMAL LOW (ref 3.5–5.0)
Alkaline Phosphatase: 80 U/L (ref 38–126)
Anion gap: 9 (ref 5–15)
BUN: 25 mg/dL — ABNORMAL HIGH (ref 6–20)
CO2: 22 mmol/L (ref 22–32)
Calcium: 8.3 mg/dL — ABNORMAL LOW (ref 8.9–10.3)
Chloride: 108 mmol/L (ref 98–111)
Creatinine, Ser: 1.03 mg/dL — ABNORMAL HIGH (ref 0.44–1.00)
GFR calc Af Amer: >60 mL/min (ref >60)
GFR calc non Af Amer: >60 mL/min (ref >60)
Glucose, Bld: 65 mg/dL — ABNORMAL LOW (ref 70–99)
Potassium: 3.5 mmol/L (ref 3.5–5.1)
Sodium: 139 mmol/L (ref 135–145)
Total Bilirubin: 1.4 mg/dL — ABNORMAL HIGH (ref 0.3–1.2)
Total Protein: 6.5 g/dL (ref 6.5–8.1)

## 2019-09-08 LAB — CBC WITH DIFFERENTIAL/PLATELET
Abs Immature Granulocytes: 0.02 10*3/uL (ref 0.00–0.07)
Basophils Absolute: 0 10*3/uL (ref 0.0–0.1)
Basophils Relative: 0 %
Eosinophils Absolute: 0 10*3/uL (ref 0.0–0.5)
Eosinophils Relative: 1 %
HCT: 38 % (ref 36.0–46.0)
Hemoglobin: 11.9 g/dL — ABNORMAL LOW (ref 12.0–15.0)
Immature Granulocytes: 0 %
Lymphocytes Relative: 26 %
Lymphs Abs: 1.5 10*3/uL (ref 0.7–4.0)
MCH: 26.1 pg (ref 26.0–34.0)
MCHC: 31.3 g/dL (ref 30.0–36.0)
MCV: 83.3 fL (ref 80.0–100.0)
Monocytes Absolute: 0.3 10*3/uL (ref 0.1–1.0)
Monocytes Relative: 6 %
Neutro Abs: 3.8 10*3/uL (ref 1.7–7.7)
Neutrophils Relative %: 67 %
Platelets: 271 10*3/uL (ref 150–400)
RBC: 4.56 MIL/uL (ref 3.87–5.11)
RDW: 18.1 % — ABNORMAL HIGH (ref 11.5–15.5)
WBC: 5.7 10*3/uL (ref 4.0–10.5)
nRBC: 0 % (ref 0.0–0.2)

## 2019-09-08 LAB — LACTIC ACID, PLASMA: Lactic Acid, Venous: 0.8 mmol/L (ref 0.5–1.9)

## 2019-09-08 MED ORDER — TRAMADOL HCL 50 MG PO TABS
50.0000 mg | ORAL_TABLET | Freq: Once | ORAL | Status: AC
  Administered 2019-09-08: 50 mg via ORAL
  Filled 2019-09-08: qty 1

## 2019-09-08 MED ORDER — SULFAMETHOXAZOLE-TRIMETHOPRIM 800-160 MG PO TABS: 1.0000 | ORAL_TABLET | Freq: Two times a day (BID) | ORAL | 0 refills | Status: AC

## 2019-09-08 MED ORDER — SULFAMETHOXAZOLE-TRIMETHOPRIM 800-160 MG PO TABS
1.0000 | ORAL_TABLET | Freq: Once | ORAL | Status: AC
  Administered 2019-09-08: 1 via ORAL
  Filled 2019-09-08: qty 1

## 2019-09-08 NOTE — ED Provider Notes (Signed)
[facility] DEPT Provider Note   CSN: [id number] Arrival date & time: 09/08/19  1408     History   Chief Complaint Chief Complaint  Patient presents with  . Wound Infection    HPI [name] is a 58 y.o. female.     HPI Patient with multiple medical issues including multiple amputations, cocaine use disorder, nonhealing wound presents with concern of pain and possible drainage from her right above-the-knee amputation site. Onset is unclear, but it seems as though she always has some degree of pain in this area. She notes that she ran out of her pain medication at some point in the past week or so, has not been taking medication for infection for quite some time.  At some point she developed worsening pain in the right leg distally, with minimal discharge.  No new fever, vomiting, or other complaints. Is unclear if she is taking any medication for pain control.  She states that she has not seen her physician in at least 2 weeks, has not seen her orthopedist in longer than that. Past Medical History:  Diagnosis Date  . CAP (community acquired pneumonia) 03/2014 X 2  . Cocaine abuse ([place])    ongoing with resultant vaculitis.  . Depression   . Headache    "weekly" (07/29/2016)  . Hypertension   . Inflammatory arthritis   . Migraines    "probably 5-6/yr" (07/29/2016)  . Normocytic anemia    BL Hgb 9.8-12. Last anemia panel 04/2010 - showing Fe 19, ferritin 101.  Pt on monthly B12 injections  . Rheumatoid arthritis(714.0)    patient reported  . S/P bilateral below knee amputation ([place]) 04/13/2017  . 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 Followed by Dr. Louanne Skye    Patient Active Problem List   Diagnosis Date Noted  . Hyperglycemia 08/27/2019  . Disability of walking 05/28/2019  . At risk for adverse drug event 08/30/2018  . Microcytic hypochromic anemia 08/30/2018  . Thrombocytopenia  ([place]) 08/20/2018  . Toxic encephalopathy 08/20/2018  . Goals of care, counseling/discussion   . Palliative care by specialist   . DNR (do not resuscitate) discussion   . Infiltrate noted on imaging study   . Ulcer of right thigh, limited to breakdown of skin ([place])   . S/P bilateral above knee amputation ([place])   . Homelessness 07/30/2018  . Elevated LFTs 07/30/2018  . Osteomyelitis ([place]) 07/30/2018  . Acute encephalopathy 07/30/2018  . Eye swelling, bilateral 07/30/2018  . Skin rash 07/11/2018  . Acute on chronic diastolic CHF (congestive heart failure) ([place]) 06/21/2018  . Malnutrition of moderate degree 05/20/2018  . Acute congestive heart failure ([place])   . Suicidal ideation 02/02/2018  . HCAP (healthcare-associated pneumonia) 02/02/2018  . Acute on chronic respiratory failure with hypoxia ([place]) 02/02/2018  . Sepsis ([place]) 02/02/2018  . Ulcer of amputation stump of lower extremity ([place]) 10/23/2017  . Cocaine abuse with cocaine-induced mood disorder ([place]) 08/10/2017  . Hypertensive crisis   . Phantom limb pain ([place])   . Tobacco abuse   . Post-operative pain   . Acute blood loss anemia   . Atherosclerosis of native arteries of extremities with gangrene, bilateral legs ([place])   . AKI (acute kidney injury) ([place]) 02/07/2017  . Acute kidney injury ([place]) 02/06/2017  . Wound healing, delayed   . Chest pain 04/07/2015  . Protein-calorie malnutrition, severe ([place]) 01/15/2015  . MDD (major depressive disorder), recurrent  episode, severe ([place]) 10/16/2014  . Cocaine use disorder, severe, dependence ([place]) 10/10/2014  . 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 Essential hypertension 02/26/2010    Past Surgical History:  Procedure Laterality Date  . AMPUTATION Left 05/22/2016   Procedure: AMPUTATION LEFT LONG FINGER;  Surgeon: Marybelle Killings, MD;  Location: [facility];  Service: Orthopedics;  Laterality: Left;  . AMPUTATION Bilateral 04/10/2017   Procedure: AMPUTATION BELOW KNEE;   Surgeon: Newt Minion, MD;  Location: [facility];  Service: Orthopedics;  Laterality: Bilateral;  . AMPUTATION Bilateral 02/06/2018   Procedure: AMPUTATION ABOVE KNEE;  Surgeon: Newt Minion, MD;  Location: [facility];  Service: Orthopedics;  Laterality: Bilateral;  . HERNIA REPAIR     "stomach"  . I&D EXTREMITY Right 09/26/2015   Procedure: IRRIGATION AND DEBRIDEMENT LEG WOUND  VAC PLACEMENT.;  Surgeon: Loel Lofty Dillingham, DO;  Location: [facility];  Service: Plastics;  Laterality: Right;  . INCISION AND DRAINAGE OF WOUND Bilateral 10/20/2016   Procedure: IRRIGATION AND DEBRIDEMENT WOUND BILATERAL;  Surgeon: Edrick Kins, DPM;  Location: [facility];  Service: Podiatry;  Laterality: Bilateral;  . IRRIGATION AND DEBRIDEMENT ABSCESS Bilateral 09/26/2013   Procedure: DEBRIDEMENT ULCERS BILATERAL THIGHS;  Surgeon: Gwenyth Ober, MD;  Location: [facility];  Service: General;  Laterality: Bilateral;  . SKIN BIOPSY Bilateral    shin nodules     OB History    Gravida  0   Para  0   Term  0   Preterm  0   AB  0   Living  0     SAB  0   TAB  0   Ectopic  0   Multiple  0   Live Births  0            Home Medications    Prior to Admission medications   Medication Sig Start Date End Date Taking? Authorizing Provider  amLODipine (NORVASC) 10 MG tablet Take 1 tablet (10 mg total) by mouth daily. 08/25/19   Azzie Glatter, FNP  clotrimazole-betamethasone (LOTRISONE) cream Apply to affected area 2 times daily prn for 21 days 08/22/19   Long, Wonda Olds, MD  doxycycline (VIBRAMYCIN) 100 MG capsule Take 1 capsule (100 mg total) by mouth 2 (two) times daily. One po bid x 7 days 08/25/19   Azzie Glatter, FNP  lidocaine (XYLOCAINE) 5 % ointment Apply 1 application topically 4 (four) times daily as needed. Pain 08/22/19   Long, Wonda Olds, MD  oxyCODONE (OXY IR/ROXICODONE) 5 MG immediate release tablet Take 1 tablet (5 mg total) by mouth every 4 (four) hours as needed for severe pain. 09/03/19   Charlann Lange, PA-C    Family History Family History  Problem Relation Age of Onset  . Breast cancer Mother        Breast cancer  . Alcohol abuse Mother   . Colon cancer Maternal Aunt 70  . Alcohol abuse Father     Social History Social History   Tobacco Use  . Smoking status: Current Every Day Smoker    Packs/day: 0.12    Years: 38.00    Pack years: 4.56    Types: Cigarettes  . Smokeless tobacco: Never Used  . Tobacco comment: 2 A DAY  Substance Use Topics  . Alcohol use: No    Alcohol/week: 0.0 standard drinks  . Drug use: Yes    Types: "Crack" cocaine, Cocaine    Comment: 08/19/2019     Allergies  Levamisole, Acetaminophen, Haldol [haloperidol lactate], Lisinopril, Toradol [ketorolac tromethamine], and Tramadol   Review of Systems Review of Systems  Constitutional:       Per HPI, otherwise negative  HENT:       Per HPI, otherwise negative  Respiratory:       Per HPI, otherwise negative  Cardiovascular:       Per HPI, otherwise negative  Gastrointestinal: Negative for vomiting.  Endocrine:       Negative aside from HPI  Genitourinary:       Neg aside from HPI   Musculoskeletal:       Per HPI, otherwise negative  Skin: Positive for wound.  Neurological: Negative for syncope.     Physical Exam Updated Vital Signs BP (!) 149/98 (BP Location: Right Arm)   Pulse 80   Temp 98 F (36.7 C) (Oral)   Resp 20   Wt 45.4 kg   SpO2 93%   BMI 18.61 kg/m   Physical Exam Vitals signs and nursing note reviewed.  Constitutional:      General: She is not in acute distress.    Appearance: She is well-developed.     Comments: Chronically ill-appearing female awake alert sitting upright speaking clearly, crying.  HENT:     Head: Normocephalic and atraumatic.  Eyes:     Conjunctiva/sclera: Conjunctivae normal.  Pulmonary:     Effort: Pulmonary effort is normal. No respiratory distress.     Breath sounds: Normal breath sounds. No stridor.  Abdominal:      General: There is no distension.  Musculoskeletal:       Legs:  Skin:    General: Skin is warm and dry.     Comments: Innumerable pockmarks  Neurological:     Mental Status: She is alert and oriented to person, place, and time.     Cranial Nerves: No cranial nerve deficit.     Motor: Atrophy present.  Psychiatric:        Mood and Affect: Affect is labile.      ED Treatments / Results  Labs (all labs ordered are listed, but only abnormal results are displayed) Labs Reviewed  COMPREHENSIVE METABOLIC PANEL  CBC WITH DIFFERENTIAL/PLATELET  LACTIC ACID, PLASMA  LACTIC ACID, PLASMA     Procedures Procedures (including critical care time)  Medications Ordered in ED Medications  sulfamethoxazole-trimethoprim (BACTRIM DS) 800-160 MG per tablet 1 tablet (has no administration in time range)     Initial Impression / Assessment and Plan / ED Course  I have reviewed the triage vital signs and the nursing notes.  Pertinent labs & imaging results that were available during my care of the patient were reviewed by me and considered in my medical decision making (see chart for details).       Chart review notable for 5 prior ED visit the past 6 months, notable history of polysubstance abuse, multiple other comorbidities.  The patient was seen here 1 week ago, and for physical exam chart review, states findings are similar with those.  This female with multiple medical issues including polysubstance abuse, prior amputations bilateral lower extremity presents with concern for pain and possible infection in the right distal area. Patient is awake, alert, afebrile, no evidence of bacteremia, sepsis, no surrounding erythema suggesting cellulitis.  Patient has risk factors for infection, there is a punctate area of what looks like clear drainage, but no indication for drainage, admission, IV antibiotics.  Patient started Bactrim, was discharged to follow-up with primary care/Ortho as  available.  Final Clinical Impressions(s) / ED Diagnoses   Final diagnoses:  Stump pain ([place])     Carmin Muskrat, MD 09/08/19 [phone]

## 2019-09-08 NOTE — ED Notes (Signed)
Pts wound dressed on right stump with Telfa, gauze, and kerlex

## 2019-09-08 NOTE — Discharge Instructions (Signed)
As discussed, your evaluation today has been largely reassuring.  But, it is important that you monitor your condition carefully, and do not hesitate to return to the ED if you develop new, or concerning changes in your condition. ? ?Otherwise, please follow-up with your physician for appropriate ongoing care. ? ?

## 2019-09-08 NOTE — ED Notes (Signed)
Pt given crackers and sprite. No other concerns expressed at this time .

## 2019-09-08 NOTE — ED Triage Notes (Signed)
Pt arrives via EMS from home with c/o right stump pain and wound infection. Pt has hx of the same pt reports she noticed drainage frm the site " a couple days ago" Pt denies fevers. Pt has hx of MRSA. Pt out of pain medication as well. EMS administered 137mg of Fentanyl IV

## 2019-09-18 ENCOUNTER — Other Ambulatory Visit: Payer: Self-pay

## 2019-09-18 ENCOUNTER — Emergency Department (HOSPITAL_COMMUNITY)
Admission: EM | Admit: 2019-09-18 | Discharge: 2019-09-18 | Disposition: A | Discharge status: Home / Self Care | Payer: Medicaid Other | Attending: Emergency Medicine | Admitting: Emergency Medicine

## 2019-09-18 ENCOUNTER — Encounter (HOSPITAL_COMMUNITY): Payer: Self-pay | Admitting: Emergency Medicine

## 2019-09-18 ENCOUNTER — Emergency Department (HOSPITAL_COMMUNITY): Payer: Medicaid Other

## 2019-09-18 DIAGNOSIS — F17210 Nicotine dependence, cigarettes, uncomplicated: Secondary | ICD-10-CM | POA: Diagnosis not present

## 2019-09-18 DIAGNOSIS — B372 Candidiasis of skin and nail: Secondary | ICD-10-CM | POA: Insufficient documentation

## 2019-09-18 DIAGNOSIS — I5032 Chronic diastolic (congestive) heart failure: Secondary | ICD-10-CM | POA: Diagnosis not present

## 2019-09-18 DIAGNOSIS — R1031 Right lower quadrant pain: Secondary | ICD-10-CM | POA: Insufficient documentation

## 2019-09-18 DIAGNOSIS — I110 Hypertensive heart disease with heart failure: Secondary | ICD-10-CM | POA: Insufficient documentation

## 2019-09-18 DIAGNOSIS — Z79899 Other long term (current) drug therapy: Secondary | ICD-10-CM | POA: Insufficient documentation

## 2019-09-18 DIAGNOSIS — R21 Rash and other nonspecific skin eruption: Secondary | ICD-10-CM | POA: Diagnosis present

## 2019-09-18 LAB — CBC WITH DIFFERENTIAL/PLATELET
Abs Immature Granulocytes: 0.02 10*3/uL (ref 0.00–0.07)
Basophils Absolute: 0 10*3/uL (ref 0.0–0.1)
Basophils Relative: 1 %
Eosinophils Absolute: 0.1 10*3/uL (ref 0.0–0.5)
Eosinophils Relative: 1 %
HCT: 36.9 % (ref 36.0–46.0)
Hemoglobin: 11.2 g/dL — ABNORMAL LOW (ref 12.0–15.0)
Immature Granulocytes: 0 %
Lymphocytes Relative: 34 %
Lymphs Abs: 2.1 10*3/uL (ref 0.7–4.0)
MCH: 26.5 pg (ref 26.0–34.0)
MCHC: 30.4 g/dL (ref 30.0–36.0)
MCV: 87.4 fL (ref 80.0–100.0)
Monocytes Absolute: 0.5 10*3/uL (ref 0.1–1.0)
Monocytes Relative: 8 %
Neutro Abs: 3.5 10*3/uL (ref 1.7–7.7)
Neutrophils Relative %: 56 %
Platelets: 238 10*3/uL (ref 150–400)
RBC: 4.22 MIL/uL (ref 3.87–5.11)
RDW: 18.8 % — ABNORMAL HIGH (ref 11.5–15.5)
WBC: 6.2 10*3/uL (ref 4.0–10.5)
nRBC: 0 % (ref 0.0–0.2)

## 2019-09-18 LAB — URINALYSIS, ROUTINE W REFLEX MICROSCOPIC
Bilirubin Urine: NEGATIVE
Glucose, UA: NEGATIVE mg/dL
Ketones, ur: NEGATIVE mg/dL
Nitrite: NEGATIVE
Protein, ur: 30 mg/dL — AB
Specific Gravity, Urine: 1.012 (ref 1.005–1.030)
pH: 6 (ref 5.0–8.0)

## 2019-09-18 LAB — LACTIC ACID, PLASMA
Lactic Acid, Venous: 2.3 mmol/L (ref 0.5–1.9)
Lactic Acid, Venous: 1.2 mmol/L (ref 0.5–1.9)

## 2019-09-18 LAB — COMPREHENSIVE METABOLIC PANEL
ALT: 31 U/L (ref 0–44)
AST: 34 U/L (ref 15–41)
Albumin: 3.5 g/dL (ref 3.5–5.0)
Alkaline Phosphatase: 96 U/L (ref 38–126)
Anion gap: 12 (ref 5–15)
BUN: 23 mg/dL — ABNORMAL HIGH (ref 6–20)
CO2: 20 mmol/L — ABNORMAL LOW (ref 22–32)
Calcium: 9.2 mg/dL (ref 8.9–10.3)
Chloride: 100 mmol/L (ref 98–111)
Creatinine, Ser: 0.91 mg/dL (ref 0.44–1.00)
GFR calc Af Amer: >60 mL/min (ref >60)
GFR calc non Af Amer: >60 mL/min (ref >60)
Glucose, Bld: 83 mg/dL (ref 70–99)
Potassium: 4.4 mmol/L (ref 3.5–5.1)
Sodium: 132 mmol/L — ABNORMAL LOW (ref 135–145)
Total Bilirubin: 1.6 mg/dL — ABNORMAL HIGH (ref 0.3–1.2)
Total Protein: 7.3 g/dL (ref 6.5–8.1)

## 2019-09-18 MED ORDER — MORPHINE SULFATE (PF) 4 MG/ML IV SOLN
4.0000 mg | Freq: Once | INTRAVENOUS | Status: AC
  Administered 2019-09-18: 20:00:00 4 mg via INTRAVENOUS
  Filled 2019-09-18: qty 1

## 2019-09-18 MED ORDER — SODIUM CHLORIDE 0.9 % IV SOLN
INTRAVENOUS | Status: DC
  Administered 2019-09-18: 20:00:00 via INTRAVENOUS

## 2019-09-18 MED ORDER — NYSTATIN 100000 UNIT/GM EX CREA: TOPICAL_CREAM | CUTANEOUS | 0 refills | Status: DC

## 2019-09-18 MED ORDER — NYSTATIN 100000 UNIT/GM EX POWD
Freq: Once | CUTANEOUS | Status: AC
  Administered 2019-09-18: 23:00:00 via TOPICAL
  Filled 2019-09-18: qty 15

## 2019-09-18 NOTE — ED Notes (Signed)
Urine specimen and culture sent to lab. [employer]

## 2019-09-18 NOTE — ED Provider Notes (Signed)
[facility] DEPT Provider Note   CSN: [id number] Arrival date & time: 09/18/19  1443     History Chief Complaint  Patient presents with  . Leg Pain    [name] is a 58 y.o. female.  58 year old female presents with several days of right groin pain.  She is status post above-the-knee amputation.  Does have history of chronic pain.  States that she developed a rash with subjective fevers and chills.  No urinary symptoms.  No cough or congestion.  Rash is also spread over to her left above-the-knee amputation stump.  No treatment used prior to arrival.        Past Medical History:  Diagnosis Date  . CAP (community acquired pneumonia) 03/2014 X 2  . Cocaine abuse ([place])    ongoing with resultant vaculitis.  . Depression   . Headache    "weekly" (07/29/2016)  . Hypertension   . Inflammatory arthritis   . Migraines    "probably 5-6/yr" (07/29/2016)  . Normocytic anemia    BL Hgb 9.8-12. Last anemia panel 04/2010 - showing Fe 19, ferritin 101.  Pt on monthly B12 injections  . Rheumatoid arthritis(714.0)    patient reported  . S/P bilateral below knee amputation ([place]) 04/13/2017  . 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 Followed by Dr. Louanne Skye    Patient Active Problem List   Diagnosis Date Noted  . Hyperglycemia 08/27/2019  . Disability of walking 05/28/2019  . At risk for adverse drug event 08/30/2018  . Microcytic hypochromic anemia 08/30/2018  . Thrombocytopenia ([place]) 08/20/2018  . Toxic encephalopathy 08/20/2018  . Goals of care, counseling/discussion   . Palliative care by specialist   . DNR (do not resuscitate) discussion   . Infiltrate noted on imaging study   . Ulcer of right thigh, limited to breakdown of skin ([place])   . S/P bilateral above knee amputation ([place])   . Homelessness 07/30/2018  . Elevated LFTs 07/30/2018  . Osteomyelitis ([place]) 07/30/2018  . Acute  encephalopathy 07/30/2018  . Eye swelling, bilateral 07/30/2018  . Skin rash 07/11/2018  . Acute on chronic diastolic CHF (congestive heart failure) ([place]) 06/21/2018  . Malnutrition of moderate degree 05/20/2018  . Acute congestive heart failure ([place])   . Suicidal ideation 02/02/2018  . HCAP (healthcare-associated pneumonia) 02/02/2018  . Acute on chronic respiratory failure with hypoxia ([place]) 02/02/2018  . Sepsis ([place]) 02/02/2018  . Ulcer of amputation stump of lower extremity ([place]) 10/23/2017  . Cocaine abuse with cocaine-induced mood disorder ([place]) 08/10/2017  . Hypertensive crisis   . Phantom limb pain ([place])   . Tobacco abuse   . Post-operative pain   . Acute blood loss anemia   . Atherosclerosis of native arteries of extremities with gangrene, bilateral legs ([place])   . AKI (acute kidney injury) ([place]) 02/07/2017  . Acute kidney injury ([place]) 02/06/2017  . Wound healing, delayed   . Chest pain 04/07/2015  . Protein-calorie malnutrition, severe ([place]) 01/15/2015  . MDD (major depressive disorder), recurrent episode, severe ([place]) 10/16/2014  . Cocaine use disorder, severe, dependence ([place]) 10/10/2014  . 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 Essential hypertension 02/26/2010    Past Surgical History:  Procedure Laterality Date  . AMPUTATION Left 05/22/2016   Procedure: AMPUTATION LEFT LONG FINGER;  Surgeon: Marybelle Killings, MD;  Location: [facility];  Service: Orthopedics;  Laterality: Left;  . AMPUTATION Bilateral 04/10/2017   Procedure: AMPUTATION  BELOW KNEE;  Surgeon: Newt Minion, MD;  Location: [facility];  Service: Orthopedics;  Laterality: Bilateral;  . AMPUTATION Bilateral 02/06/2018   Procedure: AMPUTATION ABOVE KNEE;  Surgeon: Newt Minion, MD;  Location: [facility];  Service: Orthopedics;  Laterality: Bilateral;  . HERNIA REPAIR     "stomach"  . I&D EXTREMITY Right 09/26/2015   Procedure: IRRIGATION AND DEBRIDEMENT LEG WOUND  VAC PLACEMENT.;  Surgeon: Loel Lofty Dillingham,  DO;  Location: [facility];  Service: Plastics;  Laterality: Right;  . INCISION AND DRAINAGE OF WOUND Bilateral 10/20/2016   Procedure: IRRIGATION AND DEBRIDEMENT WOUND BILATERAL;  Surgeon: Edrick Kins, DPM;  Location: [facility];  Service: Podiatry;  Laterality: Bilateral;  . IRRIGATION AND DEBRIDEMENT ABSCESS Bilateral 09/26/2013   Procedure: DEBRIDEMENT ULCERS BILATERAL THIGHS;  Surgeon: Gwenyth Ober, MD;  Location: [facility];  Service: General;  Laterality: Bilateral;  . SKIN BIOPSY Bilateral    shin nodules     OB History    Gravida  0   Para  0   Term  0   Preterm  0   AB  0   Living  0     SAB  0   TAB  0   Ectopic  0   Multiple  0   Live Births  0           Family History  Problem Relation Age of Onset  . Breast cancer Mother        Breast cancer  . Alcohol abuse Mother   . Colon cancer Maternal Aunt 36  . Alcohol abuse Father     Social History   Tobacco Use  . Smoking status: Current Every Day Smoker    Packs/day: 0.12    Years: 38.00    Pack years: 4.56    Types: Cigarettes  . Smokeless tobacco: Never Used  . Tobacco comment: 2 A DAY  Substance Use Topics  . Alcohol use: No    Alcohol/week: 0.0 standard drinks  . Drug use: Yes    Types: "Crack" cocaine, Cocaine    Comment: 08/19/2019    Home Medications Prior to Admission medications   Medication Sig Start Date End Date Taking? Authorizing Provider  amLODipine (NORVASC) 10 MG tablet Take 1 tablet (10 mg total) by mouth daily. 08/25/19   Azzie Glatter, FNP  clotrimazole-betamethasone (LOTRISONE) cream Apply to affected area 2 times daily prn for 21 days Patient not taking: Reported on 09/08/2019 08/22/19   Long, Wonda Olds, MD  doxycycline (VIBRAMYCIN) 100 MG capsule Take 1 capsule (100 mg total) by mouth 2 (two) times daily. One po bid x 7 days 08/25/19   Azzie Glatter, FNP  lidocaine (XYLOCAINE) 5 % ointment Apply 1 application topically 4 (four) times daily as needed. Pain Patient not  taking: Reported on 09/08/2019 08/22/19   Long, Wonda Olds, MD  oxyCODONE (OXY IR/ROXICODONE) 5 MG immediate release tablet Take 1 tablet (5 mg total) by mouth every 4 (four) hours as needed for severe pain. Patient not taking: Reported on 09/08/2019 09/03/19   Charlann Lange, PA-C    Allergies    Levamisole, Acetaminophen, Haldol [haloperidol lactate], Lisinopril, Toradol [ketorolac tromethamine], and Tramadol  Review of Systems   Review of Systems  All other systems reviewed and are negative.   Physical Exam Updated Vital Signs BP (!) 188/113   Pulse 88   Temp 97.8 F (36.6 C)   Resp (!) 22   SpO2 100%   Physical  Exam Vitals and nursing note reviewed.  Constitutional:      General: She is not in acute distress.    Appearance: Normal appearance. She is well-developed. She is not toxic-appearing.  HENT:     Head: Normocephalic and atraumatic.  Eyes:     General: Lids are normal.     Conjunctiva/sclera: Conjunctivae normal.     Pupils: Pupils are equal, round, and reactive to light.  Neck:     Thyroid: No thyroid mass.     Trachea: No tracheal deviation.  Cardiovascular:     Rate and Rhythm: Normal rate and regular rhythm.     Heart sounds: Normal heart sounds. No murmur. No gallop.   Pulmonary:     Effort: Pulmonary effort is normal. No respiratory distress.     Breath sounds: Normal breath sounds. No stridor. No decreased breath sounds, wheezing, rhonchi or rales.  Abdominal:     General: Bowel sounds are normal. There is no distension.     Palpations: Abdomen is soft.     Tenderness: There is no abdominal tenderness. There is no rebound.  Genitourinary:   Musculoskeletal:        General: No tenderness. Normal range of motion.     Cervical back: Normal range of motion and neck supple.  Skin:    General: Skin is warm and dry.     Findings: No abrasion or rash.  Neurological:     Mental Status: She is alert and oriented to person, place, and time.     GCS: GCS eye  subscore is 4. GCS verbal subscore is 5. GCS motor subscore is 6.     Cranial Nerves: No cranial nerve deficit.     Sensory: No sensory deficit.  Psychiatric:        Speech: Speech normal.        Behavior: Behavior normal.     ED Results / Procedures / Treatments   Labs (all labs ordered are listed, but only abnormal results are displayed) Labs Reviewed  LACTIC ACID, PLASMA - Abnormal; Notable for the following components:      Result Value   Lactic Acid, Venous 2.3 (*)    All other components within normal limits  COMPREHENSIVE METABOLIC PANEL - Abnormal; Notable for the following components:   Sodium 132 (*)    CO2 20 (*)    BUN 23 (*)    Total Bilirubin 1.6 (*)    All other components within normal limits  CBC WITH DIFFERENTIAL/PLATELET - Abnormal; Notable for the following components:   Hemoglobin 11.2 (*)    RDW 18.8 (*)    All other components within normal limits  CULTURE, BLOOD (ROUTINE X 2)  CULTURE, BLOOD (ROUTINE X 2)  LACTIC ACID, PLASMA    EKG None  Radiology No results found.  Procedures Procedures (including critical care time)  Medications Ordered in ED Medications  0.9 %  sodium chloride infusion (has no administration in time range)  morphine 4 MG/ML injection 4 mg (has no administration in time range)    ED Course  I have reviewed the triage vital signs and the nursing notes.  Pertinent labs & imaging results that were available during my care of the patient were reviewed by me and considered in my medical decision making (see chart for details).    MDM Rules/Calculators/A&P                      Patient is afebrile here.  First lactate  noted and repeat was with normal limits.  Urinalysis negative for infection.  CT T of abdomen and pelvis negative for intra-abdominal process.  Patient medicated for pain here suspect that she has a yeast infection in her groin and will discharge home Final Clinical Impression(s) / ED Diagnoses Final  diagnoses:  None    Rx / DC Orders ED Discharge Orders    None       Lacretia Leigh, MD 09/18/19 2228

## 2019-09-18 NOTE — ED Notes (Signed)
Pt provided with crackers, peanut butter and drink.

## 2019-09-18 NOTE — ED Notes (Signed)
Pt transported to CT ?

## 2019-09-18 NOTE — ED Notes (Signed)
[facility] called for transport.  

## 2019-09-18 NOTE — ED Triage Notes (Signed)
Per EMS pt is a bilateral AKA with complaint of right leg pain; scab present a right amputee site. Rash noted on right leg/groin area; pain onset this am.

## 2019-09-18 NOTE — ED Notes (Signed)
Date and time results received: 09/18/19 7:38 PM  (use smartphrase ".now" to insert current time)  Test: Lactic Acid Critical Value: 2.3  Name of Provider Notified: Dr.Allen  Orders Received? Or Actions Taken?:

## 2019-09-18 NOTE — ED Notes (Signed)
Thiis writer attempted lab draw x2- unsuccessful x2. RN Lilia Pro aware

## 2019-09-20 LAB — URINE CULTURE: Culture: 80000 — AB

## 2019-09-23 LAB — CULTURE, BLOOD (ROUTINE X 2)
Culture: NO GROWTH
Culture: NO GROWTH
Special Requests: ADEQUATE

## 2019-09-23 IMAGING — CT CT ANGIO CHEST
2 of 8 series · 19 of 46 positions shown · IV contrast (OMNI)
Comparison: 07/04/2017 CT angiogram of the chest.

CLINICAL DATA: 56 y/o F; central chest pain and mild shortness of
breath.

EXAM:
CT ANGIOGRAPHY CHEST WITH CONTRAST
TECHNIQUE: Multidetector CT imaging of the chest was performed using the
standard protocol during bolus administration of intravenous
contrast. Multiplanar CT image reconstructions and MIPs were
obtained to evaluate the vascular anatomy.
CONTRAST:  100mL 5FV52O-WDH IOPAMIDOL (5FV52O-WDH) INJECTION 76%

[Series 6: thins · axial · 0.71mm/px · z∈[+966,+1242]mm · 16 of 304 slices shown]
[im 14/304  lung]
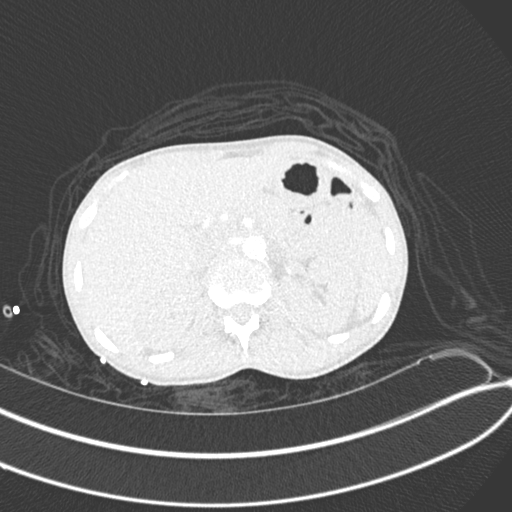
[im 28/304  soft-tissue]
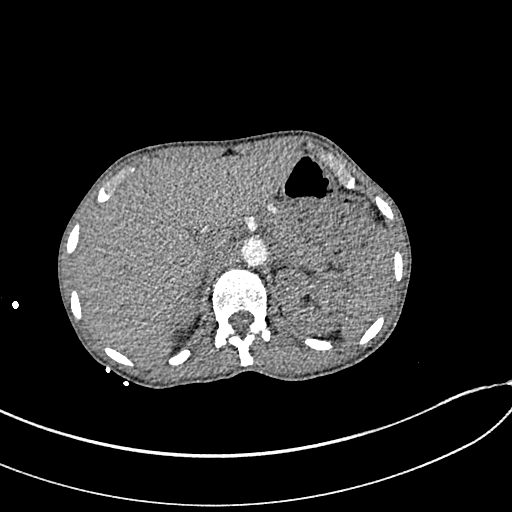
[im 56/304  lung]
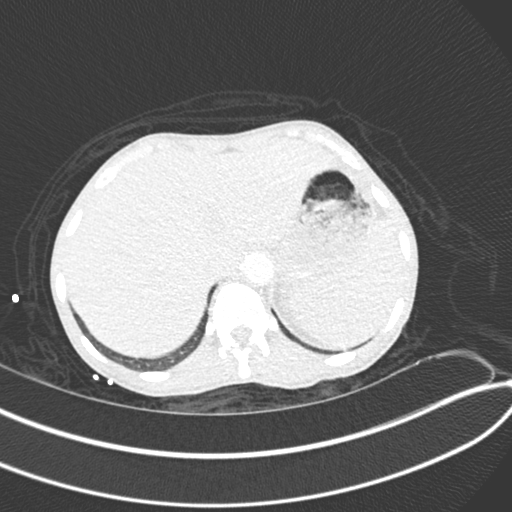
[im 69/304  soft-tissue]
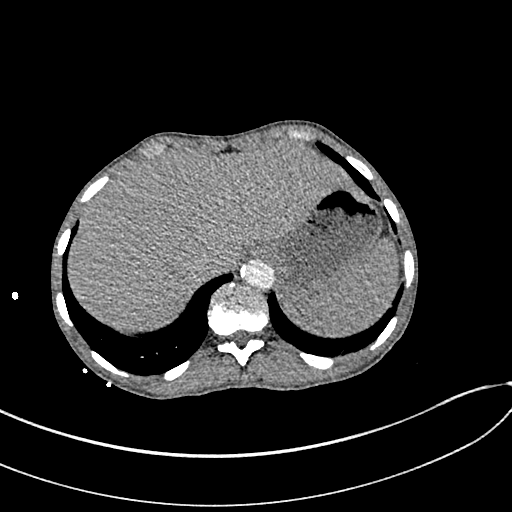
[im 83/304  lung]
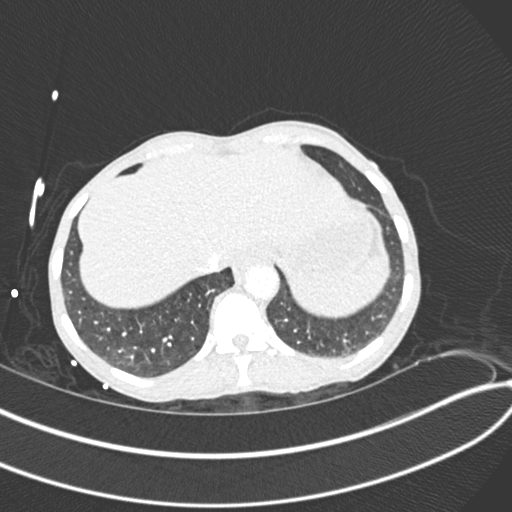
[im 111/304  soft-tissue]
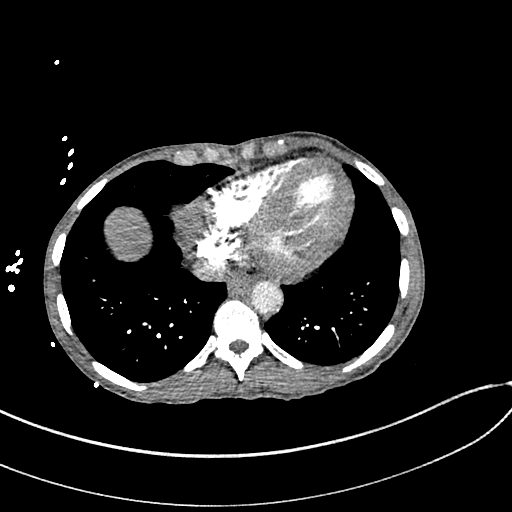
[im 124/304  lung]
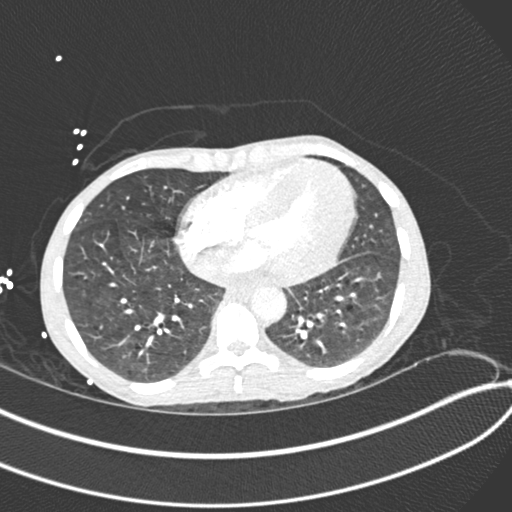
[im 138/304  soft-tissue]
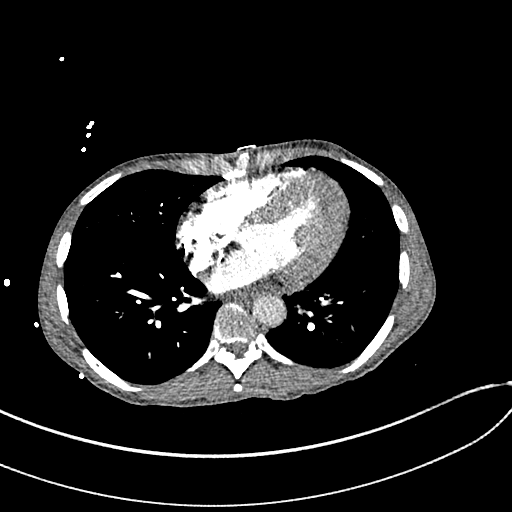
[im 166/304  lung]
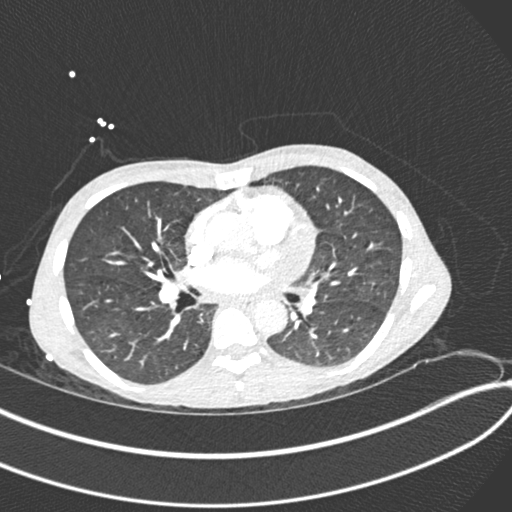
[im 180/304  soft-tissue]
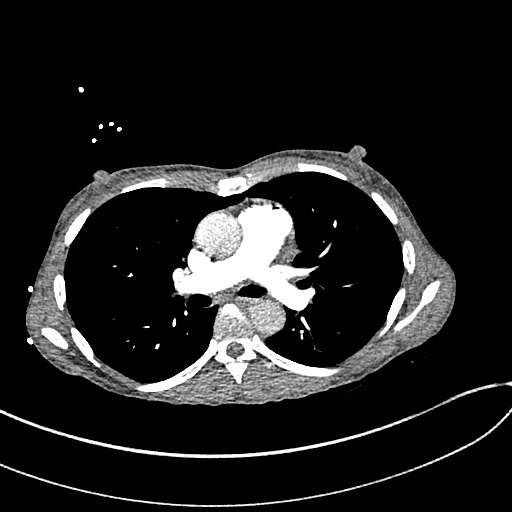
[im 193/304  lung]
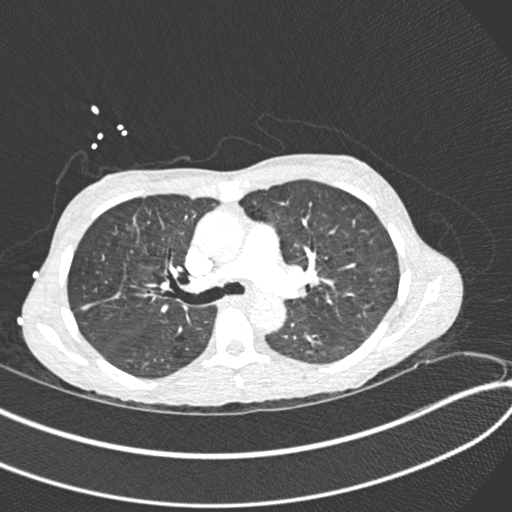
[im 221/304  soft-tissue]
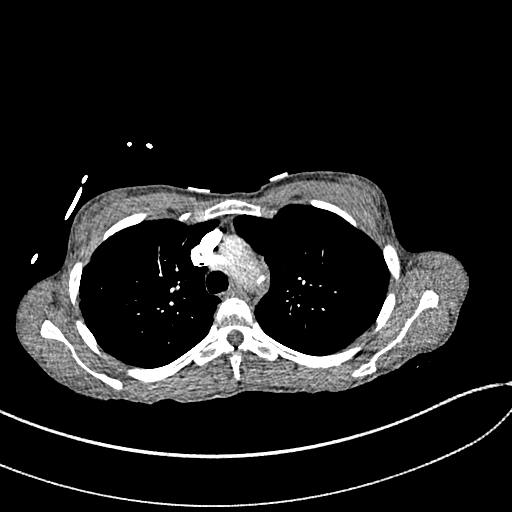
[im 235/304  lung]
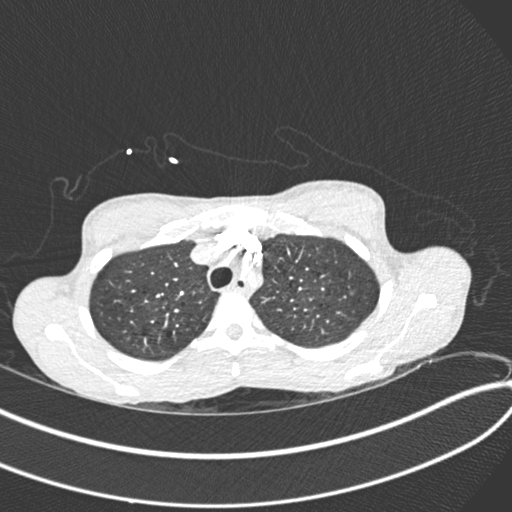
[im 248/304  soft-tissue]
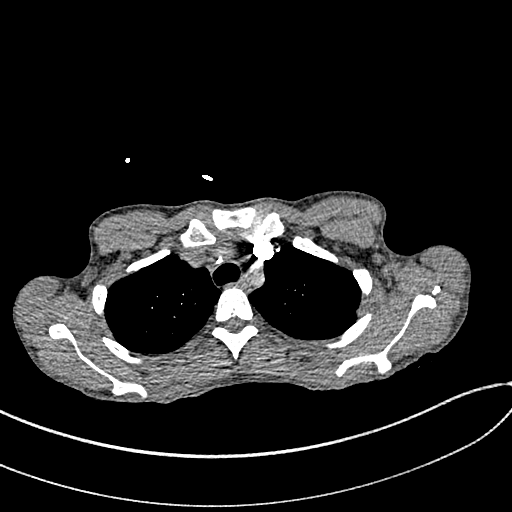
[im 276/304  lung]
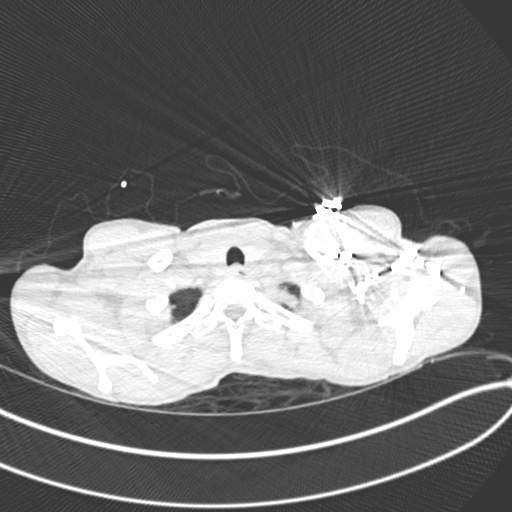
[im 290/304  soft-tissue]
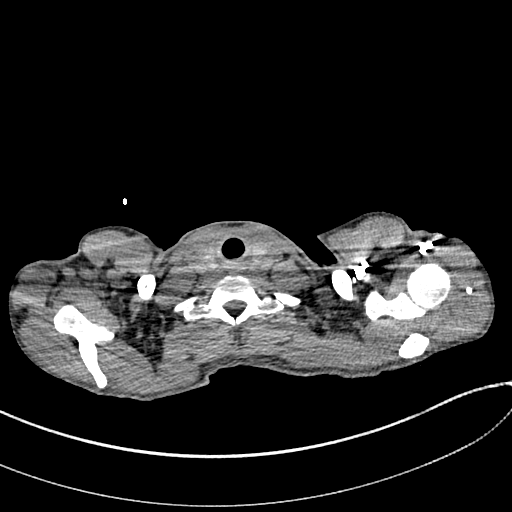

[Series 8: coronal mpr · coronal · 0.59mm/px · 3 of 150 slices shown]
[im 38/150  soft-tissue]
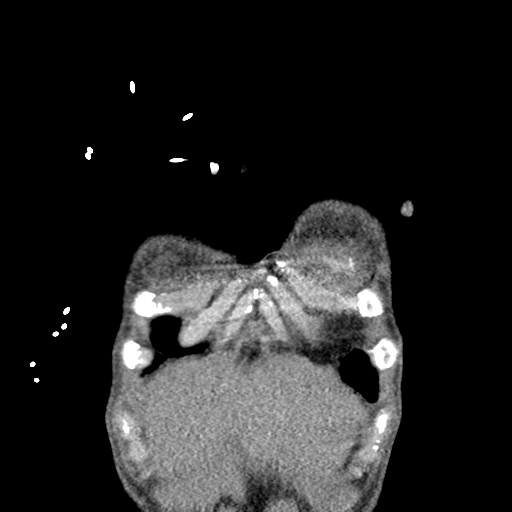
[im 75/150  soft-tissue]
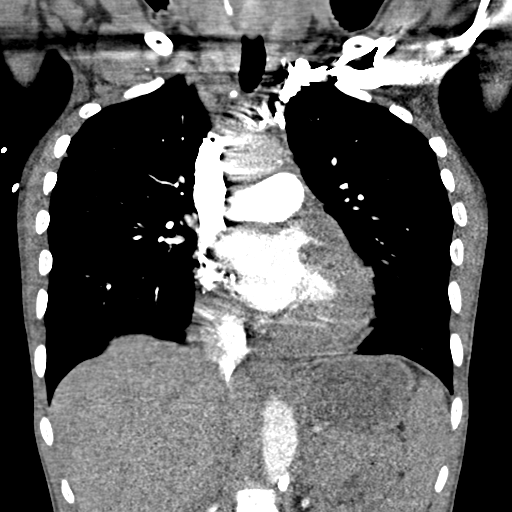
[im 112/150  soft-tissue]
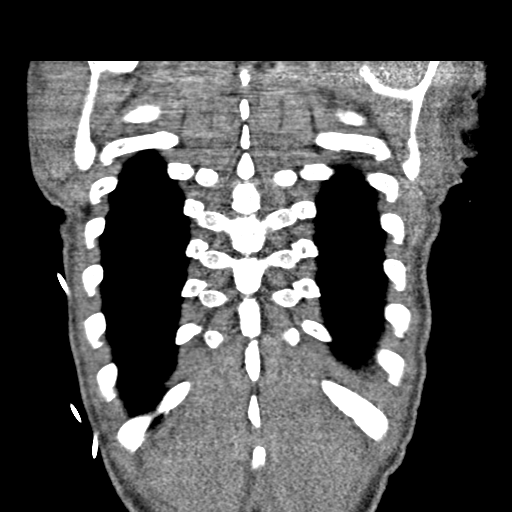

[19 of 46 positions shown; findings below may reference images not displayed]

FINDINGS: Cardiovascular: Satisfactory opacification of the pulmonary arteries
to the segmental level. No evidence of pulmonary embolism. Normal
heart size. No pericardial effusion. Mild calcific atherosclerosis
of thoracic aorta.

Mediastinum/Nodes: No enlarged mediastinal, hilar, or axillary lymph
nodes. Normal thyroid gland and thoracic esophagus. 10 mm air-filled
structure in the right tracheoesophageal groove at lung apex appears
to have a small neck contiguous with the trachea (series 7 image
13-17), possibly a tracheal diverticulum, stable.

Lungs/Pleura: Lungs are clear. No pleural effusion or pneumothorax.
Mild centrilobular emphysema.

Upper Abdomen: No acute abnormality.

Musculoskeletal: No chest wall abnormality. No acute or significant
osseous findings.

Review of the MIP images confirms the above findings.
IMPRESSION: 1. No pulmonary embolus identified.
2. Mild centrilobular emphysema of the lungs. No acute pulmonary
process identified.
3. Mild aortic atherosclerosis.
4. Stable 10 mm possible tracheal diverticulum at thoracic outlet.

By: Upiek Mardin M.D.

## 2019-09-23 IMAGING — DX DG CHEST 2V
2 series · 2 of 2 positions shown · non-contrast
Comparison: 09/14/2017

CLINICAL DATA: Severe chest pain

EXAM:
CHEST  2 VIEW

[chest lat]
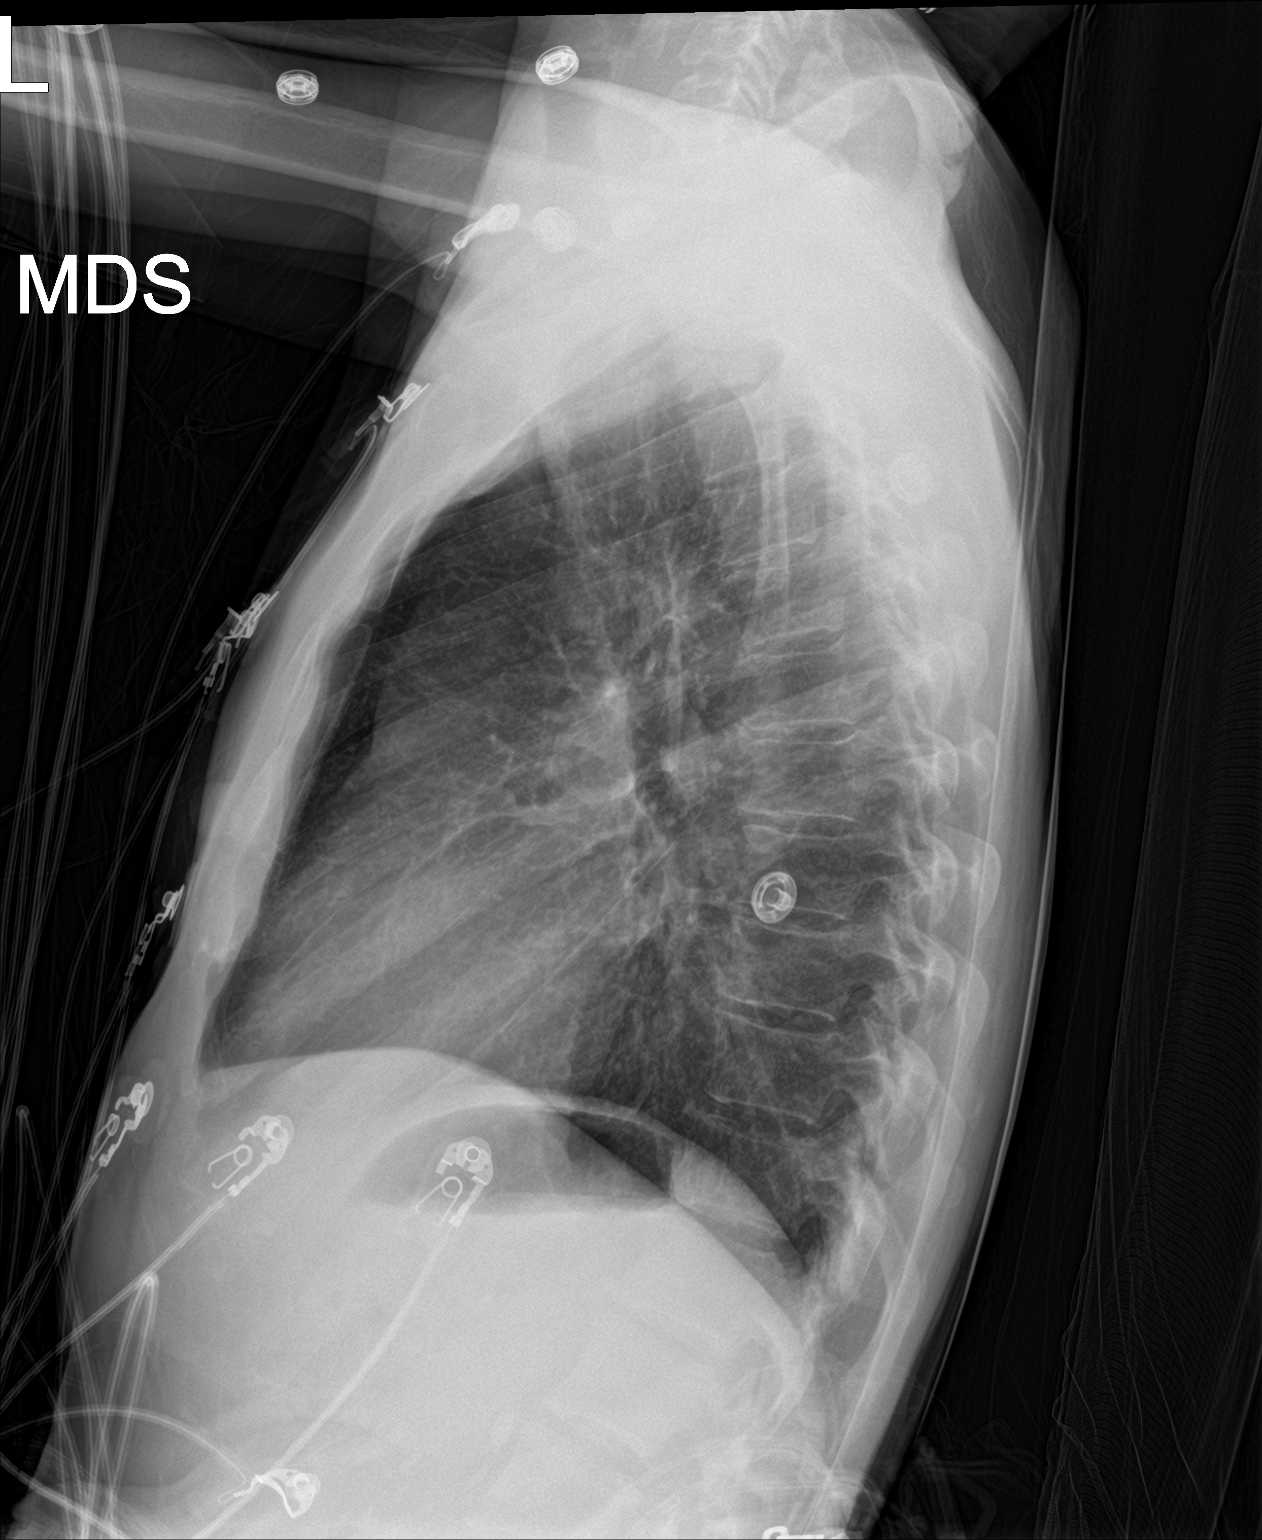

[chest ap]
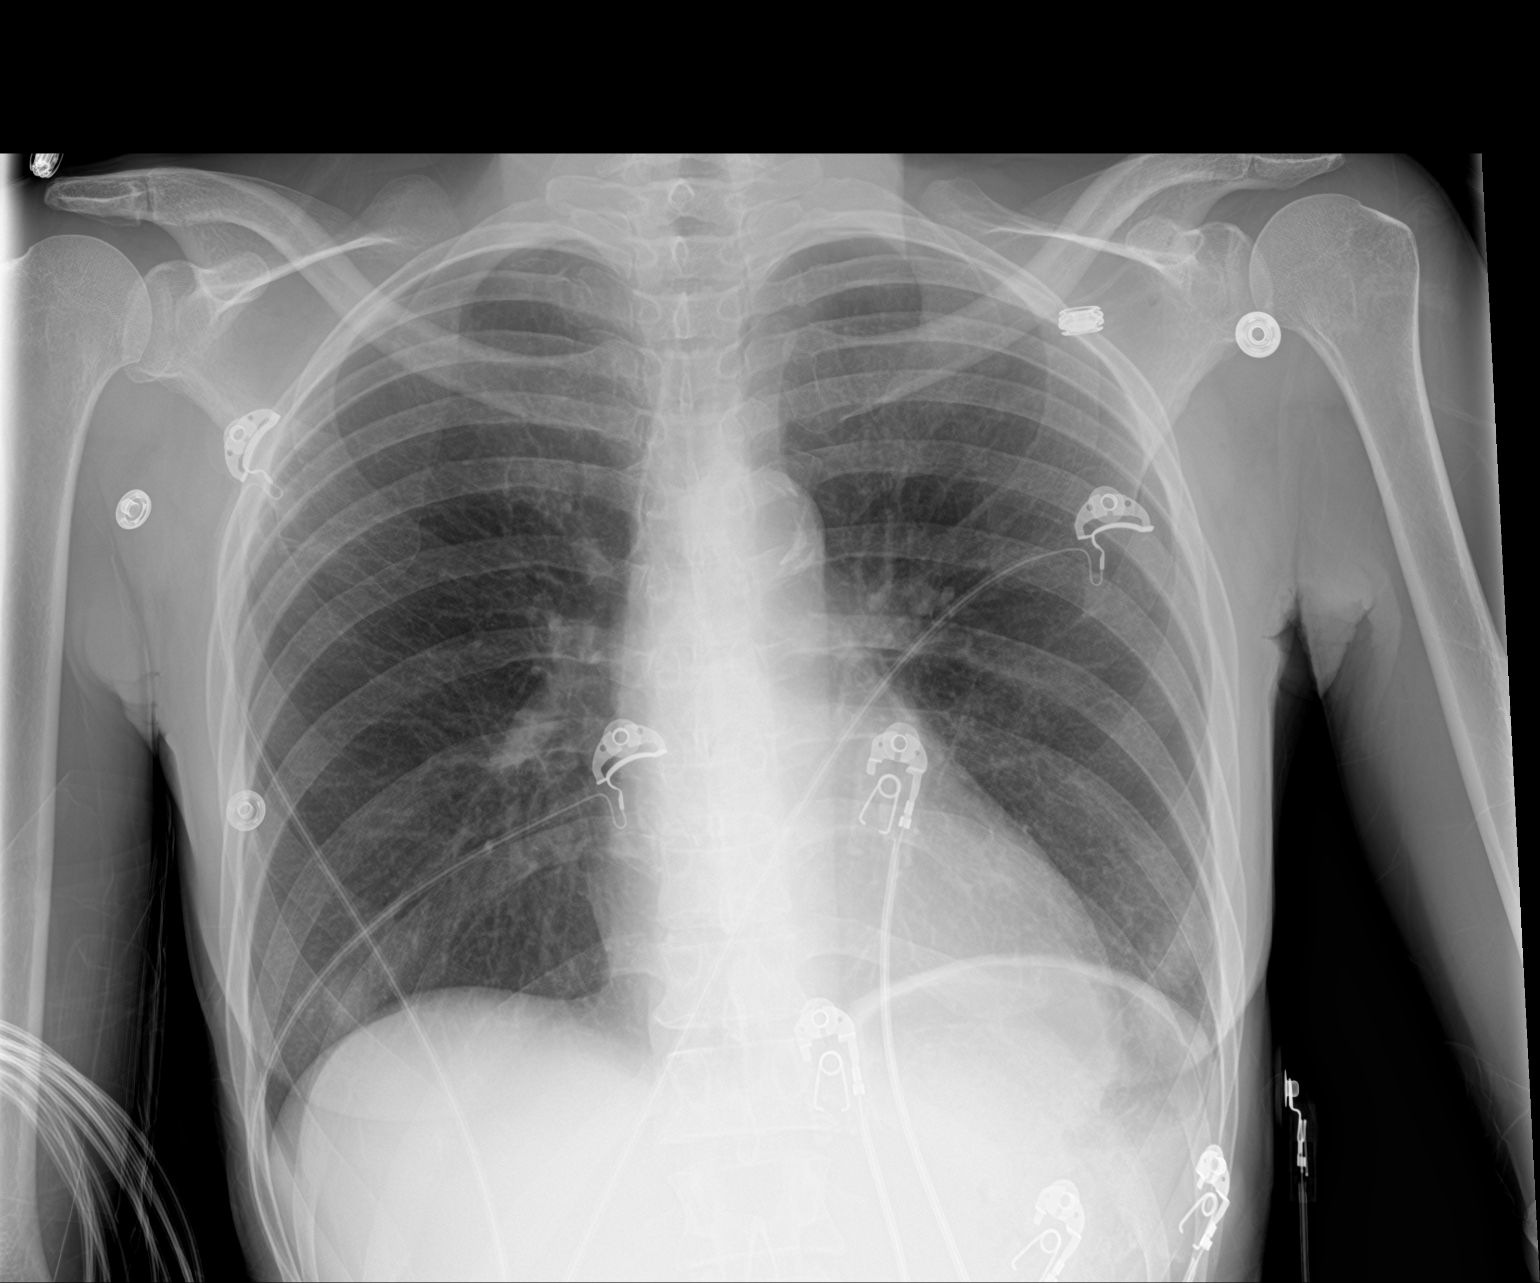

[2 of 2 positions shown; findings below may reference images not displayed]

FINDINGS: Normal heart size, mediastinal contours, and pulmonary vascularity.

Atherosclerotic calcifications aorta.

Lungs clear.

No pleural effusion or pneumothorax.

Bones unremarkable.
IMPRESSION: No acute abnormalities.

## 2019-09-26 ENCOUNTER — Inpatient Hospital Stay (HOSPITAL_COMMUNITY)
Admission: EM | Admit: 2019-09-26 | Discharge: 2019-09-30 | DRG: 918 | Disposition: A | Discharge status: Home / Self Care | Payer: Medicaid Other | Attending: Internal Medicine | Admitting: Internal Medicine

## 2019-09-26 ENCOUNTER — Other Ambulatory Visit: Payer: Self-pay

## 2019-09-26 ENCOUNTER — Encounter (HOSPITAL_COMMUNITY): Payer: Self-pay | Admitting: Emergency Medicine

## 2019-09-26 ENCOUNTER — Emergency Department (HOSPITAL_COMMUNITY): Payer: Medicaid Other

## 2019-09-26 DIAGNOSIS — Z89611 Acquired absence of right leg above knee: Secondary | ICD-10-CM

## 2019-09-26 DIAGNOSIS — I10 Essential (primary) hypertension: Secondary | ICD-10-CM | POA: Diagnosis present

## 2019-09-26 DIAGNOSIS — D649 Anemia, unspecified: Secondary | ICD-10-CM

## 2019-09-26 DIAGNOSIS — Z8701 Personal history of pneumonia (recurrent): Secondary | ICD-10-CM

## 2019-09-26 DIAGNOSIS — E872 Acidosis, unspecified: Secondary | ICD-10-CM

## 2019-09-26 DIAGNOSIS — F1410 Cocaine abuse, uncomplicated: Secondary | ICD-10-CM

## 2019-09-26 DIAGNOSIS — L0390 Cellulitis, unspecified: Secondary | ICD-10-CM | POA: Diagnosis present

## 2019-09-26 DIAGNOSIS — T405X1A Poisoning by cocaine, accidental (unintentional), initial encounter: Principal | ICD-10-CM | POA: Diagnosis present

## 2019-09-26 DIAGNOSIS — Z79899 Other long term (current) drug therapy: Secondary | ICD-10-CM

## 2019-09-26 DIAGNOSIS — M069 Rheumatoid arthritis, unspecified: Secondary | ICD-10-CM | POA: Diagnosis present

## 2019-09-26 DIAGNOSIS — M79609 Pain in unspecified limb: Secondary | ICD-10-CM | POA: Diagnosis present

## 2019-09-26 DIAGNOSIS — Z885 Allergy status to narcotic agent status: Secondary | ICD-10-CM

## 2019-09-26 DIAGNOSIS — Z888 Allergy status to other drugs, medicaments and biological substances status: Secondary | ICD-10-CM

## 2019-09-26 DIAGNOSIS — T8789 Other complications of amputation stump: Secondary | ICD-10-CM | POA: Diagnosis present

## 2019-09-26 DIAGNOSIS — Z89612 Acquired absence of left leg above knee: Secondary | ICD-10-CM

## 2019-09-26 DIAGNOSIS — Z20828 Contact with and (suspected) exposure to other viral communicable diseases: Secondary | ICD-10-CM | POA: Diagnosis present

## 2019-09-26 DIAGNOSIS — F17210 Nicotine dependence, cigarettes, uncomplicated: Secondary | ICD-10-CM | POA: Diagnosis present

## 2019-09-26 LAB — CBC WITH DIFFERENTIAL/PLATELET
Abs Immature Granulocytes: 0.04 10*3/uL (ref 0.00–0.07)
Basophils Absolute: 0 10*3/uL (ref 0.0–0.1)
Basophils Relative: 0 %
Eosinophils Absolute: 0.1 10*3/uL (ref 0.0–0.5)
Eosinophils Relative: 1 %
HCT: 38.4 % (ref 36.0–46.0)
Hemoglobin: 11.7 g/dL — ABNORMAL LOW (ref 12.0–15.0)
Immature Granulocytes: 1 %
Lymphocytes Relative: 31 %
Lymphs Abs: 2.2 10*3/uL (ref 0.7–4.0)
MCH: 27.1 pg (ref 26.0–34.0)
MCHC: 30.5 g/dL (ref 30.0–36.0)
MCV: 89.1 fL (ref 80.0–100.0)
Monocytes Absolute: 0.4 10*3/uL (ref 0.1–1.0)
Monocytes Relative: 6 %
Neutro Abs: 4.2 10*3/uL (ref 1.7–7.7)
Neutrophils Relative %: 61 %
Platelets: 267 10*3/uL (ref 150–400)
RBC: 4.31 MIL/uL (ref 3.87–5.11)
RDW: 18.4 % — ABNORMAL HIGH (ref 11.5–15.5)
WBC: 6.9 10*3/uL (ref 4.0–10.5)
nRBC: 0.4 % — ABNORMAL HIGH (ref 0.0–0.2)

## 2019-09-26 LAB — URINALYSIS, ROUTINE W REFLEX MICROSCOPIC
Bilirubin Urine: NEGATIVE
Glucose, UA: NEGATIVE mg/dL
Ketones, ur: NEGATIVE mg/dL
Nitrite: NEGATIVE
Protein, ur: 100 mg/dL — AB
Specific Gravity, Urine: 1.021 (ref 1.005–1.030)
pH: 5 (ref 5.0–8.0)

## 2019-09-26 LAB — SARS CORONAVIRUS 2 (TAT 6-24 HRS): SARS Coronavirus 2: NEGATIVE

## 2019-09-26 LAB — COMPREHENSIVE METABOLIC PANEL
ALT: 103 U/L — ABNORMAL HIGH (ref 0–44)
AST: 64 U/L — ABNORMAL HIGH (ref 15–41)
Albumin: 3.3 g/dL — ABNORMAL LOW (ref 3.5–5.0)
Alkaline Phosphatase: 103 U/L (ref 38–126)
Anion gap: 16 — ABNORMAL HIGH (ref 5–15)
BUN: 33 mg/dL — ABNORMAL HIGH (ref 6–20)
CO2: 15 mmol/L — ABNORMAL LOW (ref 22–32)
Calcium: 9.3 mg/dL (ref 8.9–10.3)
Chloride: 103 mmol/L (ref 98–111)
Creatinine, Ser: 1.05 mg/dL — ABNORMAL HIGH (ref 0.44–1.00)
GFR calc Af Amer: >60 mL/min (ref >60)
GFR calc non Af Amer: 59 mL/min — ABNORMAL LOW (ref >60)
Glucose, Bld: 93 mg/dL (ref 70–99)
Potassium: 4.5 mmol/L (ref 3.5–5.1)
Sodium: 134 mmol/L — ABNORMAL LOW (ref 135–145)
Total Bilirubin: 2.6 mg/dL — ABNORMAL HIGH (ref 0.3–1.2)
Total Protein: 6.7 g/dL (ref 6.5–8.1)

## 2019-09-26 LAB — LACTIC ACID, PLASMA
Lactic Acid, Venous: 3.5 mmol/L (ref 0.5–1.9)
Lactic Acid, Venous: 2.2 mmol/L (ref 0.5–1.9)

## 2019-09-26 MED ORDER — ONDANSETRON HCL 4 MG/2ML IJ SOLN
4.0000 mg | Freq: Once | INTRAMUSCULAR | Status: AC
  Administered 2019-09-26: 4 mg via INTRAVENOUS
  Filled 2019-09-26: qty 2

## 2019-09-26 MED ORDER — VANCOMYCIN HCL 750 MG/150ML IV SOLN
750.0000 mg | INTRAVENOUS | Status: DC
  Administered 2019-09-26: 750 mg via INTRAVENOUS
  Filled 2019-09-26 (×2): qty 150

## 2019-09-26 MED ORDER — VANCOMYCIN HCL IN DEXTROSE 1-5 GM/200ML-% IV SOLN
1000.0000 mg | Freq: Once | INTRAVENOUS | Status: DC
  Filled 2019-09-26: qty 200

## 2019-09-26 MED ORDER — LACTATED RINGERS IV SOLN: INTRAVENOUS | Status: AC

## 2019-09-26 MED ORDER — AMLODIPINE BESYLATE 10 MG PO TABS
10.0000 mg | ORAL_TABLET | Freq: Every day | ORAL | Status: DC
  Administered 2019-09-26 – 2019-09-30 (×5): 10 mg via ORAL
  Filled 2019-09-26 (×5): qty 1

## 2019-09-26 MED ORDER — FENTANYL CITRATE (PF) 100 MCG/2ML IJ SOLN
100.0000 ug | Freq: Once | INTRAMUSCULAR | Status: AC
  Administered 2019-09-26: 100 ug via INTRAVENOUS
  Filled 2019-09-26: qty 2

## 2019-09-26 MED ORDER — ONDANSETRON HCL 4 MG PO TABS: 4.0000 mg | ORAL_TABLET | Freq: Four times a day (QID) | ORAL | Status: DC | PRN

## 2019-09-26 MED ORDER — OXYCODONE HCL 5 MG PO TABS
5.0000 mg | ORAL_TABLET | ORAL | Status: DC | PRN
  Administered 2019-09-26 – 2019-09-28 (×6): 5 mg via ORAL
  Filled 2019-09-26 (×8): qty 1

## 2019-09-26 MED ORDER — LACTATED RINGERS IV BOLUS
1000.0000 mL | Freq: Once | INTRAVENOUS | Status: AC
  Administered 2019-09-26: 1000 mL via INTRAVENOUS

## 2019-09-26 MED ORDER — ENOXAPARIN SODIUM 40 MG/0.4ML ~~LOC~~ SOLN
40.0000 mg | SUBCUTANEOUS | Status: DC
  Administered 2019-09-26: 40 mg via SUBCUTANEOUS
  Filled 2019-09-26: qty 0.4

## 2019-09-26 MED ORDER — ONDANSETRON HCL 4 MG/2ML IJ SOLN: 4.0000 mg | Freq: Four times a day (QID) | INTRAMUSCULAR | Status: DC | PRN

## 2019-09-26 MED ORDER — SODIUM CHLORIDE 0.9 % IV SOLN
2.0000 g | Freq: Once | INTRAVENOUS | Status: AC
  Administered 2019-09-26: 2 g via INTRAVENOUS
  Filled 2019-09-26: qty 20

## 2019-09-26 NOTE — ED Notes (Signed)
Admitting at bedside 

## 2019-09-26 NOTE — ED Notes (Signed)
Report called to Roj, RN - pt to be transported to floor around 1600 as requested.

## 2019-09-26 NOTE — ED Notes (Signed)
Pt being transported to 5N11 via stretcher.

## 2019-09-26 NOTE — ED Provider Notes (Signed)
[facility] EMERGENCY DEPARTMENT Provider Note   CSN: [id number] Arrival date & time: 09/26/19  5102     History Chief Complaint  Patient presents with  . Wound check    [name] is a 58 y.o. female.  HPI 58 year old female presents with bilateral stump pain.  Has been ongoing for couple days.  She states she has had blisters in the area and thinks the right one has some drainage.  She thinks she might of had a fever yesterday but did not take her temperature.  The pain is severe.  She does admit to using cocaine 2 or 3 days ago. She has had some vomiting over the last few days. One episode of diarrhea. No abdominal pain.  Past Medical History:  Diagnosis Date  . CAP (community acquired pneumonia) 03/2014 X 2  . Cocaine abuse ([place])    ongoing with resultant vaculitis.  . Depression   . Headache    "weekly" (07/29/2016)  . Hypertension   . Inflammatory arthritis   . Migraines    "probably 5-6/yr" (07/29/2016)  . Normocytic anemia    BL Hgb 9.8-12. Last anemia panel 04/2010 - showing Fe 19, ferritin 101.  Pt on monthly B12 injections  . Rheumatoid arthritis(714.0)    patient reported  . S/P bilateral below knee amputation ([place]) 04/13/2017  . 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 Followed by Dr. Louanne Skye    Patient Active Problem List   Diagnosis Date Noted  . Hyperglycemia 08/27/2019  . Disability of walking 05/28/2019  . At risk for adverse drug event 08/30/2018  . Microcytic hypochromic anemia 08/30/2018  . Thrombocytopenia ([place]) 08/20/2018  . Toxic encephalopathy 08/20/2018  . Goals of care, counseling/discussion   . Palliative care by specialist   . DNR (do not resuscitate) discussion   . Infiltrate noted on imaging study   . Ulcer of right thigh, limited to breakdown of skin ([place])   . S/P bilateral above knee amputation ([place])   . Homelessness 07/30/2018  . Elevated LFTs 07/30/2018  .  Osteomyelitis ([place]) 07/30/2018  . Acute encephalopathy 07/30/2018  . Eye swelling, bilateral 07/30/2018  . Skin rash 07/11/2018  . Acute on chronic diastolic CHF (congestive heart failure) ([place]) 06/21/2018  . Malnutrition of moderate degree 05/20/2018  . Acute congestive heart failure ([place])   . Suicidal ideation 02/02/2018  . HCAP (healthcare-associated pneumonia) 02/02/2018  . Acute on chronic respiratory failure with hypoxia ([place]) 02/02/2018  . Sepsis ([place]) 02/02/2018  . Ulcer of amputation stump of lower extremity ([place]) 10/23/2017  . Cocaine abuse with cocaine-induced mood disorder ([place]) 08/10/2017  . Hypertensive crisis   . Phantom limb pain ([place])   . Tobacco abuse   . Post-operative pain   . Acute blood loss anemia   . Atherosclerosis of native arteries of extremities with gangrene, bilateral legs ([place])   . AKI (acute kidney injury) ([place]) 02/07/2017  . Acute kidney injury ([place]) 02/06/2017  . Wound healing, delayed   . Chest pain 04/07/2015  . Protein-calorie malnutrition, severe ([place]) 01/15/2015  . MDD (major depressive disorder), recurrent episode, severe ([place]) 10/16/2014  . Cocaine use disorder, severe, dependence ([place]) 10/10/2014  . 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 Essential hypertension 02/26/2010    Past Surgical History:  Procedure Laterality Date  . AMPUTATION Left 05/22/2016   Procedure: AMPUTATION LEFT LONG FINGER;  Surgeon: Marybelle Killings, MD;  Location: [place];  Service: Orthopedics;  Laterality: Left;  . AMPUTATION Bilateral 04/10/2017   Procedure: AMPUTATION BELOW KNEE;  Surgeon: Newt Minion, MD;  Location: [place];  Service: Orthopedics;  Laterality: Bilateral;  . AMPUTATION Bilateral 02/06/2018   Procedure: AMPUTATION ABOVE KNEE;  Surgeon: Newt Minion, MD;  Location: [place];  Service: Orthopedics;  Laterality: Bilateral;  . HERNIA REPAIR     "stomach"  . I & D EXTREMITY Right 09/26/2015   Procedure: IRRIGATION AND DEBRIDEMENT LEG WOUND  VAC  PLACEMENT.;  Surgeon: Loel Lofty Dillingham, DO;  Location: [place];  Service: Plastics;  Laterality: Right;  . INCISION AND DRAINAGE OF WOUND Bilateral 10/20/2016   Procedure: IRRIGATION AND DEBRIDEMENT WOUND BILATERAL;  Surgeon: Edrick Kins, DPM;  Location: [place];  Service: Podiatry;  Laterality: Bilateral;  . IRRIGATION AND DEBRIDEMENT ABSCESS Bilateral 09/26/2013   Procedure: DEBRIDEMENT ULCERS BILATERAL THIGHS;  Surgeon: Gwenyth Ober, MD;  Location: [place];  Service: General;  Laterality: Bilateral;  . SKIN BIOPSY Bilateral    shin nodules     OB History    Gravida  0   Para  0   Term  0   Preterm  0   AB  0   Living  0     SAB  0   TAB  0   Ectopic  0   Multiple  0   Live Births  0           Family History  Problem Relation Age of Onset  . Breast cancer Mother        Breast cancer  . Alcohol abuse Mother   . Colon cancer Maternal Aunt 65  . Alcohol abuse Father     Social History   Tobacco Use  . Smoking status: Current Every Day Smoker    Packs/day: 0.12    Years: 38.00    Pack years: 4.56    Types: Cigarettes  . Smokeless tobacco: Never Used  . Tobacco comment: 2 A DAY  Substance Use Topics  . Alcohol use: No    Alcohol/week: 0.0 standard drinks  . Drug use: Yes    Types: "Crack" cocaine, Cocaine    Comment: 08/19/2019    Home Medications Prior to Admission medications   Medication Sig Start Date End Date Taking? Authorizing Provider  amLODipine (NORVASC) 10 MG tablet Take 1 tablet (10 mg total) by mouth daily. 08/25/19   Azzie Glatter, FNP  doxycycline (VIBRAMYCIN) 100 MG capsule Take 1 capsule (100 mg total) by mouth 2 (two) times daily. One po bid x 7 days Patient not taking: Reported on 09/18/2019 08/25/19   Azzie Glatter, FNP  nystatin cream (MYCOSTATIN) Apply to affected area 2 times daily 09/18/19   Lacretia Leigh, MD    Allergies    Levamisole, Acetaminophen, Haldol [haloperidol lactate], Lisinopril, Toradol [ketorolac  tromethamine], and Tramadol  Review of Systems   Review of Systems  Constitutional: Positive for fever (subjective).  Musculoskeletal: Positive for myalgias.  Skin: Positive for color change and wound.  All other systems reviewed and are negative.   Physical Exam Updated Vital Signs BP (!) 185/113 (BP Location: Right Arm)   Pulse 87   Temp (!) 95.9 F (35.5 C) (Oral)   Resp 18   SpO2 100%   Physical Exam Vitals and nursing note reviewed.  Constitutional:      General: She is in acute distress (in pain).     Appearance: She is well-developed.  Comments: Chronically ill appearing  HENT:     Head: Normocephalic and atraumatic.     Right Ear: External ear normal.     Left Ear: External ear normal.     Nose: Nose normal.  Eyes:     General:        Right eye: No discharge.        Left eye: No discharge.  Cardiovascular:     Rate and Rhythm: Normal rate and regular rhythm.     Heart sounds: Normal heart sounds.  Pulmonary:     Effort: Pulmonary effort is normal.     Breath sounds: Normal breath sounds.  Abdominal:     General: There is no distension.     Tenderness: There is no abdominal tenderness.  Musculoskeletal:     Comments: See pictures. Diffuse tenderness to bilateral AKA stumps. Mild drainage from right leg wound. No swelling/palpable abscess  Skin:    General: Skin is warm and dry.  Neurological:     Mental Status: She is alert.  Psychiatric:        Mood and Affect: Mood is not anxious.       ED Results / Procedures / Treatments   Labs (all labs ordered are listed, but only abnormal results are displayed) Labs Reviewed  LACTIC ACID, PLASMA - Abnormal; Notable for the following components:      Result Value   Lactic Acid, Venous 3.5 (*)    All other components within normal limits  COMPREHENSIVE METABOLIC PANEL - Abnormal; Notable for the following components:   Sodium 134 (*)    CO2 15 (*)    BUN 33 (*)    Creatinine, Ser 1.05 (*)    Albumin  3.3 (*)    AST 64 (*)    ALT 103 (*)    Total Bilirubin 2.6 (*)    GFR calc non Af Amer 59 (*)    Anion gap 16 (*)    All other components within normal limits  CBC WITH DIFFERENTIAL/PLATELET - Abnormal; Notable for the following components:   Hemoglobin 11.7 (*)    RDW 18.4 (*)    nRBC 0.4 (*)    All other components within normal limits  LACTIC ACID, PLASMA    EKG None  Radiology No results found.  Procedures .Critical Care Performed by: Sherwood Gambler, MD Authorized by: Sherwood Gambler, MD   Critical care provider statement:    Critical care time (minutes):  30   Critical care time was exclusive of:  Separately billable procedures and treating other patients   Critical care was necessary to treat or prevent imminent or life-threatening deterioration of the following conditions:  Shock and dehydration   Critical care was time spent personally by me on the following activities:  Discussions with consultants, evaluation of patient's response to treatment, examination of patient, ordering and performing treatments and interventions, ordering and review of laboratory studies, ordering and review of radiographic studies, pulse oximetry, re-evaluation of patient's condition, obtaining history from patient or surrogate and review of old charts   (including critical care time)  Medications Ordered in ED Medications  lactated ringers bolus 1,000 mL (has no administration in time range)  fentaNYL (SUBLIMAZE) injection 100 mcg (has no administration in time range)    ED Course  I have reviewed the triage vital signs and the nursing notes.  Pertinent labs & imaging results that were available during my care of the patient were reviewed by me and considered in my medical  decision making (see chart for details).    MDM Rules/Calculators/A&P                      Lab work initiated by triage shows a lactate of over 3.  Given her history of vomiting, homelessness and cocaine abuse,  I think this is probably more dehydration than acute infection but it did not clear after a 20 cc/KG IV fluid bolus.  She is not hypotensive which makes me also think is not sepsis.  However she clearly could have infectious sources and so she will be given broad antibiotics and admitted to the internal medicine teaching service for monitoring and treatment. Final Clinical Impression(s) / ED Diagnoses Final diagnoses:  Lactic acidosis    Rx / DC Orders ED Discharge Orders    None       Sherwood Gambler, MD 09/26/19 1528

## 2019-09-26 NOTE — Consult Note (Signed)
WOC Nurse Consult Note: Patient receiving care in [facility] ED18.  Consult completed remotely after review of record, including images. Reason for Consult: "stump wounds/drainage" Wound type: RLE stump site wound Pressure Injury POA: Yes/No/NA Measurement: To be provided by the bedside RN in the flowsheet section Wound bed: pink per photo Drainage (amount, consistency, odor) per consult request. To be provided by the bedside RN in the flowsheet section Periwound: intact Dressing procedure/placement/frequency:  Place Aquacel Kellie Simmering [phone]) over all wounds on the stump(s). Cover with dry gauze, tape in place. Change daily.  Monitor the wound area(s) for worsening of condition such as: Signs/symptoms of infection,  Increase in size,  Development of or worsening of odor, Development of pain, or increased pain at the affected locations.  Notify the medical team if any of these develop.  Thank you for the consult. [place] nurse will not follow at this time.  Please re-consult the [place] team if needed.  Val Riles, RN, MSN, CWOCN, CNS-BC, pager [phone]

## 2019-09-26 NOTE — Plan of Care (Signed)

## 2019-09-26 NOTE — ED Notes (Signed)
Pt arrived to Rm 52 via stretcher. Pt noted to be alert, oriented x 4. Pt aware of tx plan - waiting on bed assignment.

## 2019-09-26 NOTE — H&P (Signed)
Date: 09/26/2019               Patient Name:  [name] MRN: [id number]  DOB: Jul 05, 1961 Age / Sex: 58 y.o., female   PCP: Azzie Glatter, FNP         Medical Service: Internal Medicine Teaching Service         Attending Physician: Dr. Velna Ochs, MD    First Contact: Dr. Court Joy Pager: [phone]  Second Contact: Dr. Koleen Distance Pager: [phone]       After Hours (After 5p/  First Contact Pager: [phone]  weekends / holidays): Second Contact Pager: [phone]   Chief Complaint: Stump pain, right stump drainage   History of Present Illness: [name] is a 58 year old female with past medical history of HTN, cocaine use disorder, bilateral above the knee amputations w/ phantom limb pain who presents with pain in both amputation sites and drainage from her right stump.  Patient said pain in her AKA's are chronic, but the right knee has especially worsened in the last 2 to 3 days.  She removed the scab from the right knee and noticed drainage which patient describes as pustule. Last smoked crack cocaine 3 days ago. Denies shortness of breath, dysuria, and diarrhea.   Meds:  Current Meds  Medication Sig  . amLODipine (NORVASC) 10 MG tablet Take 1 tablet (10 mg total) by mouth daily.  Marland Kitchen nystatin cream (MYCOSTATIN) Apply to affected area 2 times daily (Patient taking differently: Apply 1 application topically 2 (two) times daily as needed (cellulitis). )    Allergies: Allergies as of 09/26/2019 - Review Complete 09/18/2019  Allergen Reaction Noted  . Levamisole  08/30/2018  . Acetaminophen Swelling and Other (See Comments)   . Haldol [haloperidol lactate]  07/30/2018  . Lisinopril  10/23/2017  . Toradol [ketorolac tromethamine]  07/30/2018  . Tramadol Rash 08/25/2019   Past Medical History:  Diagnosis Date  . CAP (community acquired pneumonia) 03/2014 X 2  . Cocaine abuse ([place])    ongoing with resultant vaculitis.  . Depression   . Headache    "weekly" (07/29/2016)    . Hypertension   . Inflammatory arthritis   . Migraines    "probably 5-6/yr" (07/29/2016)  . Normocytic anemia    BL Hgb 9.8-12. Last anemia panel 04/2010 - showing Fe 19, ferritin 101.  Pt on monthly B12 injections  . Rheumatoid arthritis(714.0)    patient reported  . S/P bilateral below knee amputation ([place]) 04/13/2017  . 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 Followed by Dr. Louanne Skye    Family History:  Family History  Problem Relation Age of Onset  . Breast cancer Mother        Breast cancer  . Alcohol abuse Mother   . Colon cancer Maternal Aunt 14  . Alcohol abuse Father      Social History: Lives in a group home.  Social History   Tobacco Use  . Smoking status: Current Every Day Smoker    Packs/day: 0.12    Years: 38.00    Pack years: 4.56    Types: Cigarettes  . Smokeless tobacco: Never Used  . Tobacco comment: 2 A DAY  Substance Use Topics  . Alcohol use: No    Alcohol/week: 0.0 standard drinks  . Drug use: Yes    Types: "Crack" cocaine, Cocaine    Comment: 08/19/2019     Review of Systems: A  complete ROS was negative except as per HPI.   Physical Exam: Blood pressure (!) 150/117, pulse 84, temperature (!) 95.9 F (35.5 C), temperature source Oral, resp. rate 16, weight 45.4 kg, SpO2 94 %.  Gen: Appears uncomfortable  , appears chronically ill HEENT: Normocephalic atraumatic, ears appear to have chronic ischemic changes, epiphora CV: Regular rate and rhythm, 3/6 holosystolic ejection murmur heard best at the apex Pulm: CTAB ,no wheezes rhonchi or rales Abd: bowel sounds present, NT/ ND Msk: Bilateral AKA , chronic appearing wound on R.AKA w/ crust off. No erythema, or pus. ( see picture ) Skin: Warm, dry, chronic ulcerations on arms, elbows, and knees. Scattered papules on left knee Neuro: Alert and oriented x 4 Psych: Mood normal , affect normal   CXR: personally reviewed my interpretation is  scarring in right upper lobe. No focal consolidation or effusion.   Assessment & Plan by Problem: Active Problems:   Cellulitis   ARTESIA BERKEY is a 58 year old female with past medical history of HTN, cocaine use disorder, bilateral above the knee amputations w/ phantom limb pain who present with pain in both amputation sites and drainage from her right stump.   #Bilataral stump pain  # Possible cellulitis or R.Stump # Lactic Acidosis  Patient last seen by Dr. Sharol Given at the end of 2019, noted skin ulcers bilaterally with caloric malnutrition, did not believe patient was a good candidatee for revision. Admitted for increase lactic acid,  wound does not appear infected on exam. No leukocytosis or objective fever.  Lactic Acid elevated 3.5>2.2, downtrend with fluids. Admitted for observation and fluids.  - Given Vanc and Ceftriaxone in ED -Follow blood cultures -Follow Covid test -Lactic acid - HIV - CMP - continue home oxycodone  #HTN - continue home amlodipine   #Transaminitis -Trend with CMP. Appears to have history in other notes, thought likely due to cocaine use. No abdominal pain on exam.   VTE ppx: Lovenox Diet: Heart , thins Code: Full    Dispo: Admit patient to Observation with expected length of stay less than 2 midnights.  Signed:  Tamsen Snider, MD PGY1  [phone]

## 2019-09-26 NOTE — ED Triage Notes (Signed)
Pt to triage via [facility] from home.  C/o pain to bilateral above knee amputations with some scabbing and blistering to bilateral sites.  EMS administered Fentanyl 100 mcg IM PTA.

## 2019-09-26 NOTE — Progress Notes (Signed)
Pharmacy Antibiotic Note  [name] is a 58 y.o. female admitted on 09/26/2019 with cellulitis.  Pharmacy has been consulted for Vancomycin dosing.  Vancomycin 750 mg IV Q24 hrs. Goal AUC 400-550. Expected AUC: 450 SCr used: 1.05  Plan: Vancomycin 750 mg IV q24hr Monitor renal function, clinical status, C&S and vanc levels as indicated  Weight: 100 lb (45.4 kg)  Temp (24hrs), Avg:95.9 F (35.5 C), Min:95.9 F (35.5 C), Max:95.9 F (35.5 C)  Recent Labs  Lab 09/26/19 0739 09/26/19 1202  WBC 6.9  --   CREATININE 1.05*  --   LATICACIDVEN 3.5* 2.2*    Estimated Creatinine Clearance: 42.9 mL/min (A) (by C-G formula based on SCr of 1.05 mg/dL (H)).    Allergies  Allergen Reactions  . Levamisole     Levamisole is a chemical adulterant of cocaine which can cause cutaneous necrosis It is used as an Health and safety inspector in veterinary medicine  . Acetaminophen Swelling and Other (See Comments)    Reaction:  Eyelid swelling  . Haldol [Haloperidol Lactate]     Possible eye swelling was given together with Toradol unsure if caused the reaction  . Lisinopril     UNSPECIFIED REACTION   . Toradol [Ketorolac Tromethamine]     Eye swelling  . Tramadol Rash    Antimicrobials this admission: Vanc 12/22 >>  Thank you for allowing pharmacy to be a part of this patient's care.  Alanda Slim, PharmD, [facility] Clinical Pharmacist Please see AMION for all Pharmacists' Contact Phone Numbers 09/26/2019, 1:11 PM

## 2019-09-27 DIAGNOSIS — Z885 Allergy status to narcotic agent status: Secondary | ICD-10-CM

## 2019-09-27 DIAGNOSIS — I10 Essential (primary) hypertension: Secondary | ICD-10-CM | POA: Diagnosis present

## 2019-09-27 DIAGNOSIS — I776 Arteritis, unspecified: Secondary | ICD-10-CM | POA: Diagnosis not present

## 2019-09-27 DIAGNOSIS — Z89612 Acquired absence of left leg above knee: Secondary | ICD-10-CM | POA: Diagnosis not present

## 2019-09-27 DIAGNOSIS — Z20828 Contact with and (suspected) exposure to other viral communicable diseases: Secondary | ICD-10-CM | POA: Diagnosis present

## 2019-09-27 DIAGNOSIS — Z89611 Acquired absence of right leg above knee: Secondary | ICD-10-CM | POA: Diagnosis not present

## 2019-09-27 DIAGNOSIS — R7402 Elevation of levels of lactic acid dehydrogenase (LDH): Secondary | ICD-10-CM | POA: Diagnosis not present

## 2019-09-27 DIAGNOSIS — F1410 Cocaine abuse, uncomplicated: Secondary | ICD-10-CM | POA: Diagnosis present

## 2019-09-27 DIAGNOSIS — Z888 Allergy status to other drugs, medicaments and biological substances status: Secondary | ICD-10-CM

## 2019-09-27 DIAGNOSIS — R238 Other skin changes: Secondary | ICD-10-CM | POA: Diagnosis not present

## 2019-09-27 DIAGNOSIS — M79609 Pain in unspecified limb: Secondary | ICD-10-CM | POA: Diagnosis present

## 2019-09-27 DIAGNOSIS — D649 Anemia, unspecified: Secondary | ICD-10-CM | POA: Diagnosis present

## 2019-09-27 DIAGNOSIS — T405X1A Poisoning by cocaine, accidental (unintentional), initial encounter: Secondary | ICD-10-CM | POA: Diagnosis present

## 2019-09-27 DIAGNOSIS — Z8701 Personal history of pneumonia (recurrent): Secondary | ICD-10-CM | POA: Diagnosis not present

## 2019-09-27 DIAGNOSIS — Z79899 Other long term (current) drug therapy: Secondary | ICD-10-CM | POA: Diagnosis not present

## 2019-09-27 DIAGNOSIS — M069 Rheumatoid arthritis, unspecified: Secondary | ICD-10-CM | POA: Diagnosis present

## 2019-09-27 DIAGNOSIS — E872 Acidosis: Secondary | ICD-10-CM | POA: Diagnosis present

## 2019-09-27 DIAGNOSIS — F17210 Nicotine dependence, cigarettes, uncomplicated: Secondary | ICD-10-CM | POA: Diagnosis present

## 2019-09-27 DIAGNOSIS — T8789 Other complications of amputation stump: Secondary | ICD-10-CM | POA: Diagnosis present

## 2019-09-27 LAB — CBC
HCT: 35.1 % — ABNORMAL LOW (ref 36.0–46.0)
Hemoglobin: 10.9 g/dL — ABNORMAL LOW (ref 12.0–15.0)
MCH: 26.9 pg (ref 26.0–34.0)
MCHC: 31.1 g/dL (ref 30.0–36.0)
MCV: 86.7 fL (ref 80.0–100.0)
Platelets: 260 10*3/uL (ref 150–400)
RBC: 4.05 MIL/uL (ref 3.87–5.11)
RDW: 18.6 % — ABNORMAL HIGH (ref 11.5–15.5)
WBC: 7.9 10*3/uL (ref 4.0–10.5)
nRBC: 0.4 % — ABNORMAL HIGH (ref 0.0–0.2)

## 2019-09-27 LAB — HEPATIC FUNCTION PANEL
ALT: 140 U/L — ABNORMAL HIGH (ref 0–44)
AST: 139 U/L — ABNORMAL HIGH (ref 15–41)
Albumin: 2.8 g/dL — ABNORMAL LOW (ref 3.5–5.0)
Alkaline Phosphatase: 90 U/L (ref 38–126)
Bilirubin, Direct: 1 mg/dL — ABNORMAL HIGH (ref 0.0–0.2)
Indirect Bilirubin: 1 mg/dL — ABNORMAL HIGH (ref 0.3–0.9)
Total Bilirubin: 2 mg/dL — ABNORMAL HIGH (ref 0.3–1.2)
Total Protein: 6 g/dL — ABNORMAL LOW (ref 6.5–8.1)

## 2019-09-27 LAB — BASIC METABOLIC PANEL
Anion gap: 10 (ref 5–15)
BUN: 24 mg/dL — ABNORMAL HIGH (ref 6–20)
CO2: 17 mmol/L — ABNORMAL LOW (ref 22–32)
Calcium: 8.6 mg/dL — ABNORMAL LOW (ref 8.9–10.3)
Chloride: 108 mmol/L (ref 98–111)
Creatinine, Ser: 1.04 mg/dL — ABNORMAL HIGH (ref 0.44–1.00)
GFR calc Af Amer: >60 mL/min (ref >60)
GFR calc non Af Amer: >60 mL/min (ref >60)
Glucose, Bld: 104 mg/dL — ABNORMAL HIGH (ref 70–99)
Potassium: 4.9 mmol/L (ref 3.5–5.1)
Sodium: 135 mmol/L (ref 135–145)

## 2019-09-27 LAB — HIV ANTIBODY (ROUTINE TESTING W REFLEX): HIV Screen 4th Generation wRfx: NONREACTIVE

## 2019-09-27 LAB — LACTIC ACID, PLASMA: Lactic Acid, Venous: 2 mmol/L (ref 0.5–1.9)

## 2019-09-27 LAB — C-REACTIVE PROTEIN: CRP: 1.8 mg/dL — ABNORMAL HIGH (ref <1.0)

## 2019-09-27 LAB — SEDIMENTATION RATE: Sed Rate: 10 mm/hr (ref 0–22)

## 2019-09-27 MED ORDER — ENOXAPARIN SODIUM 30 MG/0.3ML ~~LOC~~ SOLN
30.0000 mg | SUBCUTANEOUS | Status: DC
  Administered 2019-09-27 – 2019-09-29 (×3): 30 mg via SUBCUTANEOUS
  Filled 2019-09-27 (×3): qty 0.3

## 2019-09-27 NOTE — Plan of Care (Signed)

## 2019-09-27 NOTE — Progress Notes (Addendum)
Subjective:  Patient seen at bedside this AM. Patient states she has continued pain from her legs and has some drainage. Patient denies difficulty breathing. Plan of care discussed with patient, all questions answered.   Objective:    Vital Signs (last 24 hours): Vitals:   09/26/19 1630 09/26/19 1746 09/26/19 1955 09/27/19 0330  BP: (!) 168/111 (!) 151/95 (!) 163/106 (!) 155/83  Pulse: 87 87 87 91  Resp: 16  15 16   Temp:  97.8 F (36.6 C) 98.6 F (37 C) 98.2 F (36.8 C)  TempSrc:  Axillary Oral Oral  SpO2: 98%  94% (!) 84%  Weight:        Physical Exam: General Alert and answers questions appropriately, no acute distress  Cardiac Regular rate and rhythm.  Grade 4 diastolic murmur heard at fifth intercostal space at midclavicular line (known history of moderate-severe mitral regurgitation)  Pulmonary Clear to auscultation bilaterally without wheezes, rhonchi, or rales  MSK Approximately 2 cm open wound on right leg stump. No significant drainage present. Small amount serosanguinous drainage on left leg stump   CBC Latest Ref Rng & Units 09/27/2019 09/26/2019 09/18/2019  WBC 4.0 - 10.5 K/uL 7.9 6.9 6.2  Hemoglobin 12.0 - 15.0 g/dL 10.9(L) 11.7(L) 11.2(L)  Hematocrit 36.0 - 46.0 % 35.1(L) 38.4 36.9  Platelets 150 - 400 K/uL 260 267 238   BMP Latest Ref Rng & Units 09/27/2019 09/26/2019 09/18/2019  Glucose 70 - 99 mg/dL 104(H) 93 83  BUN 6 - 20 mg/dL 24(H) 33(H) 23(H)  Creatinine 0.44 - 1.00 mg/dL 1.04(H) 1.05(H) 0.91  BUN/Creat Ratio 9 - 23 - - -  Sodium 135 - 145 mmol/L 135 134(L) 132(L)  Potassium 3.5 - 5.1 mmol/L 4.9 4.5 4.4  Chloride 98 - 111 mmol/L 108 103 100  CO2 22 - 32 mmol/L 17(L) 15(L) 20(L)  Calcium 8.9 - 10.3 mg/dL 8.6(L) 9.3 9.2     Assessment/Plan:   Active Problems:   Cellulitis  Patient is a 58 year old female with past medical history of hypertension, cocaine use disorder, bilateral above-knee amputations with phantom limb pain who presented on  12/22 with pain in both amputation sites and drainage from right stump.  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 Sharol Given at end of 2019, noted skin ulcers bilaterally with caloric malnutrition, and did not believe patient good candidate for revision. Patient admitted for increased lactic acid, wound does not appear infected on exam.  Poor wound healing and skin changes are likely secondary to levimasole induced vasculitis and mood improved with cessation of cocaine usage. No leukocytosis or fever.  Lactic acid elevated 3.5, downtrending to 2.2 with fluids.  Admitted for observation and fluids.   * Lactic acid trend: 3.5 -> 2.2 -> 2.0 (this AM) * ESR, CRP, hepatic function panel pending * Received 1 L LR bolus on admission and started on 125 ml/hr NS fluid.  Received approximately 1500 mL fluid since admission. * Patient was given vancomycin + ceftriaxone in the ED, no indication for further antibiotics * Blood cultures drawn, will follow * Continue home oxycodone for pain -oxycodone 5 mg every 4 hours as needed -received 3 times since admission  # HTN: Continue home amlodipine. Last BP of 155/83.   # Transaminitis: AST/ALT of 64/103 on presentation.  LFTs earlier this month were within normal limits. Per chart review, patient with history of transaminitis, thought to be secondary to cocaine use, AST/ALT of 927/196 in November 2019.  #  Normocytic Anemia: Patient with hemoglobin of 10.9 this AM, stable. Hemoglobin 10.2 in November 2019. Iron studies in 2018 were consistent with anemia of chronic disease with low iron, low TIBC, and normal ferritin. * Will need outpatient followup  Diet: Heart  DVT Ppx: Lovenox 30 mg daily Dispo: Anticipated discharge in 0-1 days, will go back to group home  Jeanmarie Hubert, MD 09/27/2019, 6:30 AM Pager: [phone]

## 2019-09-28 DIAGNOSIS — Z79899 Other long term (current) drug therapy: Secondary | ICD-10-CM

## 2019-09-28 DIAGNOSIS — R238 Other skin changes: Secondary | ICD-10-CM

## 2019-09-28 DIAGNOSIS — R7401 Elevation of levels of liver transaminase levels: Secondary | ICD-10-CM

## 2019-09-28 DIAGNOSIS — T8789 Other complications of amputation stump: Secondary | ICD-10-CM

## 2019-09-28 DIAGNOSIS — D649 Anemia, unspecified: Secondary | ICD-10-CM

## 2019-09-28 DIAGNOSIS — I10 Essential (primary) hypertension: Secondary | ICD-10-CM

## 2019-09-28 DIAGNOSIS — R011 Cardiac murmur, unspecified: Secondary | ICD-10-CM

## 2019-09-28 DIAGNOSIS — I340 Nonrheumatic mitral (valve) insufficiency: Secondary | ICD-10-CM

## 2019-09-28 DIAGNOSIS — Z89612 Acquired absence of left leg above knee: Secondary | ICD-10-CM

## 2019-09-28 DIAGNOSIS — Z89611 Acquired absence of right leg above knee: Secondary | ICD-10-CM

## 2019-09-28 DIAGNOSIS — R7402 Elevation of levels of lactic acid dehydrogenase (LDH): Secondary | ICD-10-CM

## 2019-09-28 DIAGNOSIS — F1490 Cocaine use, unspecified, uncomplicated: Secondary | ICD-10-CM

## 2019-09-28 LAB — COMPREHENSIVE METABOLIC PANEL
ALT: 114 U/L — ABNORMAL HIGH (ref 0–44)
AST: 89 U/L — ABNORMAL HIGH (ref 15–41)
Albumin: 2.7 g/dL — ABNORMAL LOW (ref 3.5–5.0)
Alkaline Phosphatase: 91 U/L (ref 38–126)
Anion gap: 8 (ref 5–15)
BUN: 18 mg/dL (ref 6–20)
CO2: 19 mmol/L — ABNORMAL LOW (ref 22–32)
Calcium: 8.3 mg/dL — ABNORMAL LOW (ref 8.9–10.3)
Chloride: 106 mmol/L (ref 98–111)
Creatinine, Ser: 0.85 mg/dL (ref 0.44–1.00)
GFR calc Af Amer: >60 mL/min (ref >60)
GFR calc non Af Amer: >60 mL/min (ref >60)
Glucose, Bld: 105 mg/dL — ABNORMAL HIGH (ref 70–99)
Potassium: 4 mmol/L (ref 3.5–5.1)
Sodium: 133 mmol/L — ABNORMAL LOW (ref 135–145)
Total Bilirubin: 1.5 mg/dL — ABNORMAL HIGH (ref 0.3–1.2)
Total Protein: 5.7 g/dL — ABNORMAL LOW (ref 6.5–8.1)

## 2019-09-28 LAB — CBC
HCT: 32 % — ABNORMAL LOW (ref 36.0–46.0)
Hemoglobin: 10 g/dL — ABNORMAL LOW (ref 12.0–15.0)
MCH: 26.9 pg (ref 26.0–34.0)
MCHC: 31.3 g/dL (ref 30.0–36.0)
MCV: 86 fL (ref 80.0–100.0)
Platelets: 219 10*3/uL (ref 150–400)
RBC: 3.72 MIL/uL — ABNORMAL LOW (ref 3.87–5.11)
RDW: 18.5 % — ABNORMAL HIGH (ref 11.5–15.5)
WBC: 8.4 10*3/uL (ref 4.0–10.5)
nRBC: 0 % (ref 0.0–0.2)

## 2019-09-28 MED ORDER — OXYCODONE HCL 5 MG PO TABS
5.0000 mg | ORAL_TABLET | Freq: Once | ORAL | Status: AC
  Administered 2019-09-28: 5 mg via ORAL

## 2019-09-28 MED ORDER — OXYCODONE HCL 5 MG PO TABS: 5.0000 mg | ORAL_TABLET | ORAL | 0 refills | Status: DC | PRN

## 2019-09-28 NOTE — Progress Notes (Signed)
  Subjective:  Patient seen at bedside this morning.  Patient states that she feels well.  Denies shortness of breath, chest pain.  Reports some continued pain in her legs.  Patient is comfortable with plan for discharge.  Patient counseled on need for follow-up with primary care physician and cessation of cocaine usage.   Objective:    Vital Signs (last 24 hours): Vitals:   09/27/19 0946 09/27/19 1542 09/28/19 0436 09/28/19 0709  BP: (!) 161/94 (!) 142/90 (!) 151/89   Pulse: 87 88 89   Resp:  15    Temp:  98.4 F (36.9 C) 98.8 F (37.1 C)   TempSrc:  Oral Oral   SpO2:  91% 91%   Weight:    45.4 kg  Height:    5' 1.5" (1.562 m)    Physical Exam: General Alert and answers questions appropriately, no acute distress  Cardiac Regular rate and rhythm. Grade 4 diastolic murmur heard at fifth intercostal space at midclavicular line - known history of moderate-severe mitral regurgitation  Pulmonary Clear to auscultation bilaterally without wheezes, rhonchi, or rales   CBC Latest Ref Rng & Units 09/28/2019 09/27/2019 09/26/2019  WBC 4.0 - 10.5 K/uL 8.4 7.9 6.9  Hemoglobin 12.0 - 15.0 g/dL 10.0(L) 10.9(L) 11.7(L)  Hematocrit 36.0 - 46.0 % 32.0(L) 35.1(L) 38.4  Platelets 150 - 400 K/uL 219 260 267    BMP Latest Ref Rng & Units 09/28/2019 09/27/2019 09/26/2019  Glucose 70 - 99 mg/dL 105(H) 104(H) 93  BUN 6 - 20 mg/dL 18 24(H) 33(H)  Creatinine 0.44 - 1.00 mg/dL 0.85 1.04(H) 1.05(H)  BUN/Creat Ratio 9 - 23 - - -  Sodium 135 - 145 mmol/L 133(L) 135 134(L)  Potassium 3.5 - 5.1 mmol/L 4.0 4.9 4.5  Chloride 98 - 111 mmol/L 106 108 103  CO2 22 - 32 mmol/L 19(L) 17(L) 15(L)  Calcium 8.9 - 10.3 mg/dL 8.3(L) 8.6(L) 9.3    Assessment/Plan:   Active Problems:   Cocaine abuse (HCC)   Amputation stump pain (HCC)   Cellulitis   Anemia   Stump pain [facility])   Patient is a 58 year old female with past medical history of hypertension, cocaine use disorder, bilateral above-knee amputations  with phantom limb pain presented on 12/22 with pain in both amputation sites and drainage from right stump.  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  Wound does not appear infected on exam.  No purulent drainage present.  Poor wound healing, pain, and skin changes are likely secondary to levimasole induced vasculitis and will likely improve with cessation of cocaine use. Patient has been counseled extensively on need for cocaine cessation. ESR is within normal limits and CRP with mild elevation.  *Patient received vancomycin + ceftriaxone in the ED, no indication for further antibiotics *Blood cultures show no growth at 2 days  # HTN: Continue home amlodipine  # Transaminitis: AST/ALT of 64/103 on presentation. Patient with history of transaminitis which is thought to be secondary to cocaine use  # Normocytic Anemia: Patient with hemoglobin of 10.0 today. Iron studies in 2018 were consistent with anemia of chronic disease. No signs of active bleeding. Will need outpatient followup  Diet: Heart DVT Ppx: Lovenox 30 mg daily Dispo: Anticipated discharge today  Jeanmarie Hubert, MD 09/28/2019, 8:05 AM Pager: [phone]

## 2019-09-28 NOTE — TOC Transition Note (Signed)
Transition of Care [facility]) - CM/SW Discharge Note   Patient Details  Name: [name] MRN: [id number] Date of Birth: [birth year]  Transition of Care [facility]) CM/SW Contact:  Midge Minium MSN, RN, NCM-BC, ACM-RN [phone] Phone Number: 09/28/2019, 2:18 PM   Clinical Narrative:    CM following for dispositional needs. CM spoke to the patient to discuss the POC. Patient admitted with bilateral stump pain. Patient states living in her brother's boarding house PTA; SA history of crack cocaine. Patient requesting PTAR transportation home with demographics confirmed; patient attempting to contact her brother to ensure his availability once PTAR arrives, with her attempts unsuccessful. PTAR will be scheduled by the bedside nurse, Sophie RN, once her brother has be contacted. No further needs from CM.   Final next level of care: Other (comment)(Boarding House) Barriers to Discharge: No Barriers Identified   Patient Goals and CMS Choice Patient states their goals for this hospitalization and ongoing recovery are:: "to get home"   Choice offered to / list presented to : NA  Discharge Placement                Patient to be transferred to facility by: PTAR      Discharge Plan and Services                DME Arranged: N/A DME Agency: NA       HH Arranged: NA HH Agency: NA        Social Determinants of Health (SDOH) Interventions     Readmission Risk Interventions Readmission Risk Prevention Plan 09/28/2019 09/28/2019  Transportation Screening Complete Not Complete  Transportation Screening Comment - Not medically ready for DC  PCP or Specialist Appt within 3-5 Days Complete Not Complete  Not Complete comments - Not medically ready for [place] or [place] - Complete  Social Work Consult for [place] Planning/Counseling - Complete  Palliative Care Screening - Not Applicable  Medication Review Press photographer) - Complete  Some recent data might be hidden

## 2019-09-28 NOTE — Plan of Care (Signed)
  Problem: Education: Goal: Knowledge of General Education information will improve Description: Including pain rating scale, medication(s)/side effects and non-pharmacologic comfort measures Outcome: Progressing   Problem: Activity: Goal: Risk for activity intolerance will decrease Outcome: Progressing   Problem: Safety: Goal: Ability to remain free from injury will improve Outcome: Progressing   Problem: Skin Integrity: Goal: Risk for impaired skin integrity will decrease Outcome: Progressing

## 2019-09-28 NOTE — Plan of Care (Addendum)
Addendum 1820: Checked in with pt about reaching her brother. She stated she does not have his phone number and has been trying to reach another person that lives at the group home who hasn't answered all day. The phone number listed in Epic for her brother is her personal cell number. I tried to look up the group home for a phone number based on the address she gave [address] but can't seem to find a phone number. Pt still needs to be educated on discharge information and have personal belongings gathered.   Will continue to check in with progress and monitor patient. Paged on-call MD for an update on the situation. Waiting for a call back.     Pt will be discharging to group home. Leilani Able, CM RN for [employer] transportation and she stated that the pt will need to let us know when her brother will be at the group home and then to schedule a time for PTAR to take her home. Waiting on update from pt, will continue to monitor.   Problem: Education: Goal: Knowledge of General Education information will improve Description: Including pain rating scale, medication(s)/side effects and non-pharmacologic comfort measures Outcome: Completed/Met   Problem: Health Behavior/Discharge Planning: Goal: Ability to manage health-related needs will improve Outcome: Completed/Met   Problem: Clinical Measurements: Goal: Ability to maintain clinical measurements within normal limits will improve Outcome: Completed/Met Goal: Will remain free from infection Outcome: Completed/Met Goal: Diagnostic test results will improve Outcome: Completed/Met Goal: Respiratory complications will improve Outcome: Completed/Met Goal: Cardiovascular complication will be avoided Outcome: Completed/Met   Problem: Activity: Goal: Risk for activity intolerance will decrease Outcome: Completed/Met   Problem: Nutrition: Goal: Adequate nutrition will be maintained Outcome: Completed/Met   Problem: Coping: Goal:  Level of anxiety will decrease Outcome: Completed/Met   Problem: Elimination: Goal: Will not experience complications related to bowel motility Outcome: Completed/Met Goal: Will not experience complications related to urinary retention Outcome: Completed/Met   Problem: Pain Managment: Goal: General experience of comfort will improve Outcome: Completed/Met   Problem: Safety: Goal: Ability to remain free from injury will improve Outcome: Completed/Met   Problem: Skin Integrity: Goal: Risk for impaired skin integrity will decrease Outcome: Completed/Met   Problem: Clinical Measurements: Goal: Ability to avoid or minimize complications of infection will improve Outcome: Completed/Met   Problem: Skin Integrity: Goal: Skin integrity will improve Outcome: Completed/Met

## 2019-09-28 NOTE — Discharge Summary (Addendum)
Name: [name] MRN: [id number] DOB: 09/18/1961 58 y.o. PCP: Azzie Glatter, FNP  Date of Admission: 09/26/2019  7:13 AM Date of Discharge: 09/29/2019 Attending Physician: Velna Ochs, MD  Discharge Diagnosis: 1. Stump pain 2. Normocytic Anemia  Discharge Physical Exam: General Alert and answers questions appropriately, no acute distress  Cardiac Regular rate and rhythm. Grade 4 diastolic murmur heard best at fifth intercostal space at midclavicular line - known history of moderate to severe mitral regurgitation  Pulmonary Clear to auscultation bilaterally without wheezes, rhonchi, or rales  MSK Approximately 2 cm open wound on right leg stump, granulation tissue, no significant drainage. Left leg stump has small amount of serosanguinous drainage.     Discharge Medications: Allergies as of 09/28/2019      Reactions   Levamisole    Levamisole is a chemical adulterant of cocaine which can cause cutaneous necrosis It is used as an Health and safety inspector in veterinary medicine   Acetaminophen Swelling, Other (See Comments)   Reaction:  Eyelid swelling   Haldol [haloperidol Lactate]    Possible eye swelling was given together with Toradol unsure if caused the reaction   Lisinopril    UNSPECIFIED REACTION    Toradol [ketorolac Tromethamine]    Eye swelling   Tramadol Rash      Medication List    STOP taking these medications   doxycycline 100 MG capsule Commonly known as: VIBRAMYCIN     TAKE these medications   amLODipine 10 MG tablet Commonly known as: NORVASC Take 1 tablet (10 mg total) by mouth daily.   nystatin cream Commonly known as: MYCOSTATIN Apply to affected area 2 times daily What changed:   how much to take  how to take this  when to take this  reasons to take this  additional instructions   oxyCODONE 5 MG immediate release tablet Commonly known as: Oxy IR/ROXICODONE Take 1 tablet (5 mg total) by mouth every 4 (four) hours as needed  for moderate pain.       Disposition and follow-up:   [name] was discharged from [facility] in [place] condition.  At the hospital follow up visit please address:  1.  Please encourage continued cocaine cessation. Please followup ANCA which may be positive in setting of levimasole vasculitis. Please check CBC to evaluate patient's anemia. Please check CMP to evaluate liver function.  2.  Labs / imaging needed at time of follow-up: CBC, CMP  3.  Pending labs/ test needing follow-up: ANCA  Follow-up Appointments: Follow-up Information    Azzie Glatter, FNP Follow up.   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  Patient was admitted for elevated lactic acid and concern for cellulitis.  Patient was given vancomycin + cefepime in the emergency room, antibiotics were discontinued on admission as wound did not appear infected.  Patient was also without leukocytosis or fever, lactic acid down trended during admission. Blood cultures showed no growth at 2 days. Superficial wound cultures from stumps grew Group B Strep, likely representing contaminant.  Patient with poor wound healing and skin changes which are likely secondary to cocaine use as well as levimasole-induced vasculitis.  These will likely improve with  cessation of cocaine usage.   Patient was extensively counseled on need for cocaine cessation.  ESR, CRP and ANCA titers collected due to concern for vasculitis.  ESR was within normal limits, CRP mildly elevated, ANCA pending at discharge.  # Transaminitis: AST/ALT of 64/103 on presentation.  LFTs from earlier this month were within normal limits.  Per chart  review, patient has history of transaminitis which is thought to be secondary to cocaine use.  AST/ALT of 927/196 in November 2019.  # Normocytic anemia: Patient with hemoglobin of 10 to 11.  Iron studies in 2018 were consistent with anemia of chronic disease with low iron, low TIBC, and normal ferritin.  Patient will need outpatient follow-up for anemia.  Discharge Vitals:   BP (!) 157/79    Pulse 86    Temp 98.7 F (37.1 C) (Oral)    Resp 16    Ht 5' 1.5" (1.562 m)    Wt 45.4 kg    SpO2 96%    BMI 18.61 kg/m   Pertinent Labs, Studies, and Procedures:  CBC Latest Ref Rng & Units 09/28/2019 09/27/2019 09/26/2019  WBC 4.0 - 10.5 K/uL 8.4 7.9 6.9  Hemoglobin 12.0 - 15.0 g/dL 10.0(L) 10.9(L) 11.7(L)  Hematocrit 36.0 - 46.0 % 32.0(L) 35.1(L) 38.4  Platelets 150 - 400 K/uL 219 260 267   BMP Latest Ref Rng & Units 09/28/2019 09/27/2019 09/26/2019  Glucose 70 - 99 mg/dL 105(H) 104(H) 93  BUN 6 - 20 mg/dL 18 24(H) 33(H)  Creatinine 0.44 - 1.00 mg/dL 0.85 1.04(H) 1.05(H)  BUN/Creat Ratio 9 - 23 - - -  Sodium 135 - 145 mmol/L 133(L) 135 134(L)  Potassium 3.5 - 5.1 mmol/L 4.0 4.9 4.5  Chloride 98 - 111 mmol/L 106 108 103  CO2 22 - 32 mmol/L 19(L) 17(L) 15(L)  Calcium 8.9 - 10.3 mg/dL 8.3(L) 8.6(L) 9.3   ESR (09/27/19): 10 CRP (09/27/19): 1.8   Discharge Instructions: Discharge Instructions    Diet - low sodium heart healthy   Complete by: As directed    Discharge instructions   Complete by: As directed    You were seen in the hospital for pain. We evaluated your stumps and there are no signs of infection. The pain is likely caused by cocaine use. It is very important to stop taking cocaine. It is also important to followup with your primary care doctor.   Increase activity slowly   Complete by: As directed       Signed: Jeanmarie Hubert, MD 09/28/2019, 12:23 PM   Pager: [phone]

## 2019-09-29 LAB — AEROBIC CULTURE W GRAM STAIN (SUPERFICIAL SPECIMEN): Special Requests: NORMAL

## 2019-09-29 MED ORDER — POLYETHYLENE GLYCOL 3350 17 G PO PACK
17.0000 g | PACK | Freq: Every day | ORAL | Status: DC
  Administered 2019-09-29 – 2019-09-30 (×2): 17 g via ORAL
  Filled 2019-09-29 (×2): qty 1

## 2019-09-29 MED ORDER — OXYCODONE HCL 5 MG PO TABS
5.0000 mg | ORAL_TABLET | ORAL | Status: DC | PRN
  Administered 2019-09-29 – 2019-09-30 (×3): 5 mg via ORAL
  Filled 2019-09-29 (×3): qty 1

## 2019-09-29 NOTE — TOC Transition Note (Addendum)
Transition of Care [facility]) - CM/SW Discharge Note   Patient Details  Name: [name] MRN: [id number] Date of Birth: 01-Jul-1961  Transition of Care [facility]) CM/SW Contact:  Ella Bodo, RN Phone Number: 09/29/2019, 10:56 AM   Clinical Narrative:   Pt medically stable for discharge home today.  I was able to witness patient contact her brother, who is now at home and able to receive patient when she arrives via [place].  Pt requests to eat lunch prior to discharge.  PTAR contacted for 1:30 pm transport.  Bedside nurse updated of arrangements.      Final next level of care: Other (comment)(Boarding House) Barriers to Discharge: No Barriers Identified   Patient Goals and CMS Choice Patient states their goals for this hospitalization and ongoing recovery are:: "to get home"   Choice offered to / list presented to : NA  Discharge Placement                Patient to be transferred to facility by: PTAR      Discharge Plan and Services                DME Arranged: N/A DME Agency: NA       HH Arranged: NA HH Agency: NA        Social Determinants of Health (SDOH) Interventions     Readmission Risk Interventions Readmission Risk Prevention Plan 09/29/2019 09/28/2019 09/28/2019  Transportation Screening Complete Complete Not Complete  Transportation Screening Comment - - Not medically ready for DC  PCP or Specialist Appt within 3-5 Days - Complete Not Complete  Not Complete comments - - Not medically ready for [place] or [place] - - Complete  Social Work Consult for [place] Planning/Counseling - - Complete  Palliative Care Screening - - Not Applicable  Medication Review (RN Care Manager) - - Complete  Some recent data might be hidden   Reinaldo Raddle, RN, BSN  Trauma/Neuro ICU Case Manager [phone]

## 2019-09-29 NOTE — Progress Notes (Signed)
  Subjective:  Patient seen at bedside. Patient reports continued pain in legs.   Objective:    Vital Signs (last 24 hours): Vitals:   09/28/19 1301 09/28/19 1522 09/28/19 2006 09/29/19 0442  BP:  (!) 155/92 (!) 160/86 (!) 161/81  Pulse: 92 92 97 92  Resp:  16    Temp:  99 F (37.2 C) 99.7 F (37.6 C) 98.5 F (36.9 C)  TempSrc:  Oral Oral Oral  SpO2: 93% 94% 90% 96%  Weight:      Height:        Physical Exam: General Alert and answers questions appropriately, tearful  MSK Approximately 2 cm open wound on right leg stump, granulation tissue, no significant drainage. Left leg stump has small amount of serosanguinous drainage.    Assessment/Plan:   Principal Problem:   Amputation stump pain ([place]) Active Problems:   Cocaine abuse ([place])   Cellulitis   Anemia   Stump pain [facility])  Patient is a 58 year old female with past medical history of hypertension, cocaine use disorder, bilateral above-knee amputations with phantom limb pain who presented on 12/22 with pain in both amputation sites and mild drainage from right stump.  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  Wound does not appear infected on exam.  No purulent drainage present.  Poor wound healing, pain, and skin changes are likely secondary to levamisole-induced vasculitis will likely improve with cessation of cocaine use.  Patient was counseled extensively on need for cocaine cessation.  ESR is within normal limits and CRP with mild elevation. *Patient received vancomycin + ceftriaxone in the ED, no indication for further antibiotics *Blood culture showed no growth at 3 days  # HTN: Continue home amlodipine  # Transaminitis: AST/ALT of 64/103 on presentation.  Patient with history of transaminitis which is thought to be secondary to cocaine use.  # Normocytic anemia: Patient with hemoglobin of  10.0 yesterday.  Iron studies in 2010 were consistent with anemia of chronic disease.  No signs of active bleeding.  Will need outpatient follow-up.  Diet: Heart DVT Ppx: Lovenox 40 mg daily Dispo: Anticipated discharge today  Jeanmarie Hubert, MD 09/29/2019, 6:36 AM Pager: [phone]

## 2019-09-29 NOTE — Progress Notes (Addendum)
[facility] came in to transport pt back to the group home. Pt is crying, c/o right lower ribcage pain going to the abd. Abd is soft, non-tender. V/S checked, recorded. Pain meds given. Pt is saying, she does not want to go home, crying. Dr Aundra Dubin notified. Discharge is cancelled.

## 2019-09-30 MED ORDER — POLYETHYLENE GLYCOL 3350 17 G PO PACK: 17.0000 g | PACK | Freq: Every day | ORAL | 0 refills | Status: DC

## 2019-09-30 NOTE — Progress Notes (Deleted)
Patient having episodes of bradycardia, Dr. Philipp Ovens notified and spoke to on phone, she is calling cardiology, VO to hold lopressor until further notice.

## 2019-09-30 NOTE — TOC Transition Note (Signed)
Transition of Care [facility]) - CM/SW Discharge Note   Patient Details  Name: LORRAINA SPRING MRN: [id number] Date of Birth: [birth year]  Transition of Care [facility]) CM/SW Contact:  Atilano Median, LCSW Phone Number: 09/30/2019, 9:49 AM   Clinical Narrative:    Discharged back to boarding house. CSW spoke with patient and she is agreeable to this plan. Patient states that her brother is there and someone is always there to provide assistance as needed. Unit RN Doroteo Bradford made aware. Transport arranged. No other needs at this time. Case closed to this CSW.   Final next level of care: Other (comment)(boarding house) Barriers to Discharge: Barriers Resolved   Patient Goals and CMS Choice Patient states their goals for this hospitalization and ongoing recovery are:: "to get home"   Choice offered to / list presented to : NA  Discharge Placement                Patient to be transferred to facility by: [place] Name of family member notified: patient Patient and family notified of of transfer: 09/30/19  Discharge Plan and Services                DME Arranged: N/A DME Agency: NA       HH Arranged: NA [place] Agency: NA        Social Determinants of Health (SDOH) Interventions     Readmission Risk Interventions Readmission Risk Prevention Plan 09/29/2019 09/28/2019 09/28/2019  Transportation Screening Complete Complete Not Complete  Transportation Screening Comment - - Not medically ready for DC  PCP or Specialist Appt within 3-5 Days - Complete Not Complete  Not Complete comments - - Not medically ready for [place] or [place] - - Complete  Social Work Consult for [place] Planning/Counseling - - Complete  Palliative Care Screening - - Not Applicable  Medication Review Press photographer) - - Complete  Some recent data might be hidden

## 2019-09-30 NOTE — Progress Notes (Signed)
Internal Medicine Attending Note:  This patient's plan of care was discussed with the house staff. Please see their note for complete details. I concur with their findings.   Velna Ochs, MD 09/30/2019, 2:41 PM

## 2019-09-30 NOTE — Progress Notes (Signed)
Patient discharged back to boarding house.  Had no complaints or questions.  All belongings gathered and taken with patient.

## 2019-10-01 LAB — CULTURE, BLOOD (ROUTINE X 2)
Culture: NO GROWTH
Culture: NO GROWTH
Special Requests: ADEQUATE

## 2019-10-02 LAB — ANCA TITERS
Atypical P-ANCA titer: <1:20 {titer}
C-ANCA: <1:20 {titer}
P-ANCA: 1:320 {titer} — ABNORMAL HIGH

## 2019-10-07 IMAGING — DX DG CHEST 2V
2 series · 2 of 2 positions shown · non-contrast
Comparison: Chest radiograph October 09, 2017 and chest CT October 09, 2017

CLINICAL DATA: Cough and chest pain

EXAM:
CHEST  2 VIEW

[chest lat]
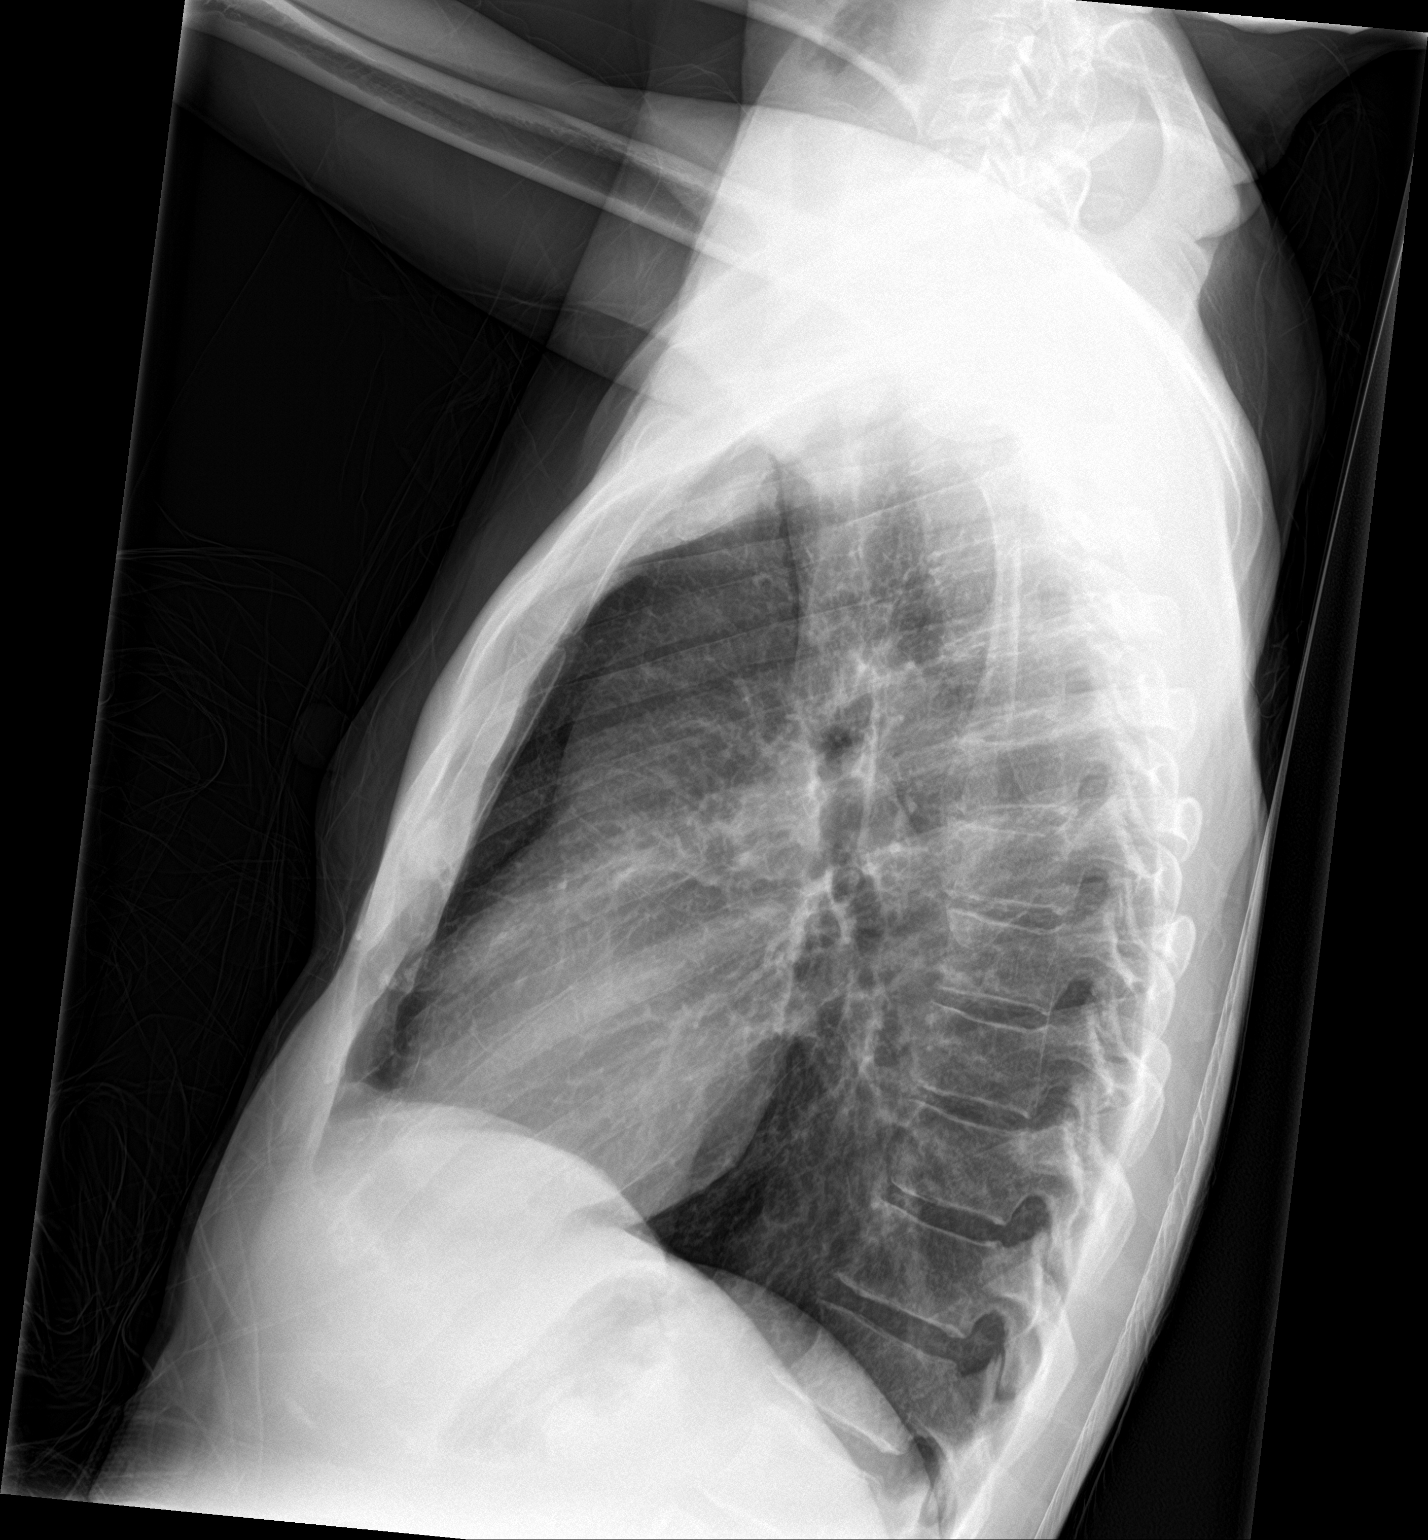

[chest ap]
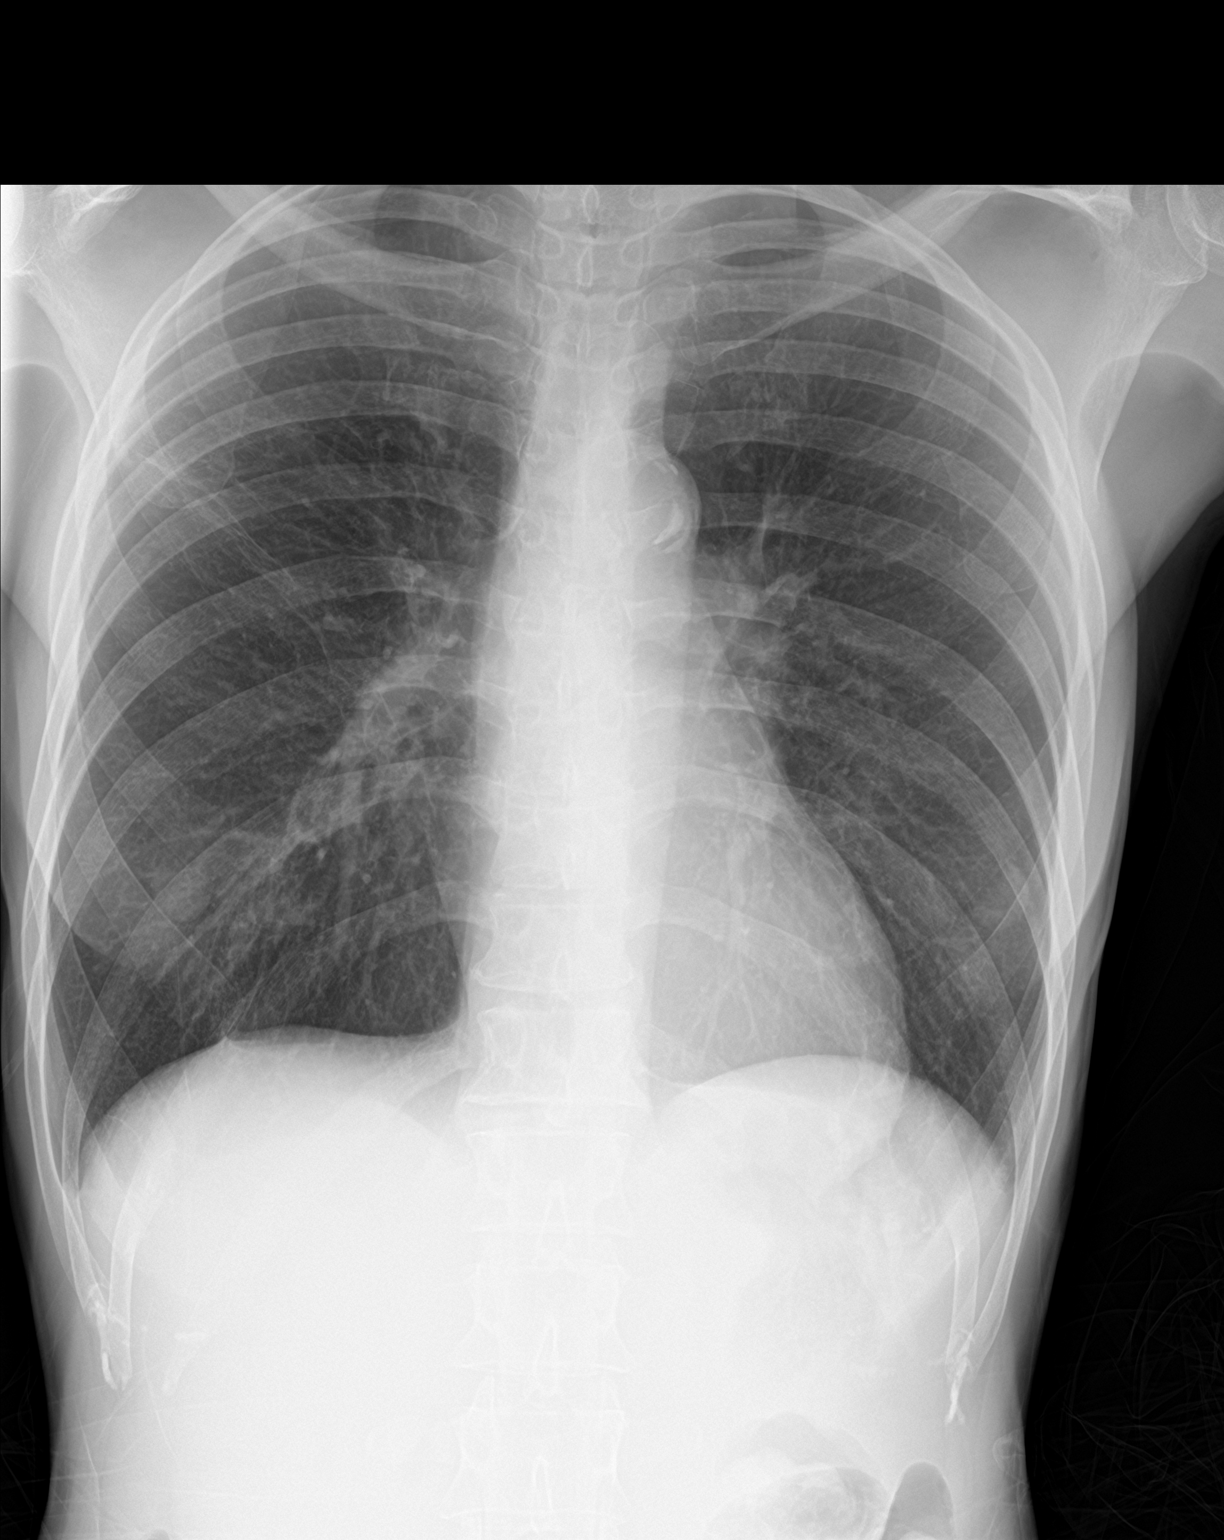

[2 of 2 positions shown; findings below may reference images not displayed]

FINDINGS: There is an apparent nipple shadow on the right. There is scarring
in the right upper lobe. There is no edema or consolidation. The
heart size and pulmonary vascularity are normal. No adenopathy.
There is aortic atherosclerosis. No evident bone lesions.
IMPRESSION: Scarring right upper lobe. No edema or consolidation. There is
aortic atherosclerosis.

Aortic Atherosclerosis ([id number]).

## 2019-10-07 IMAGING — CR DG KNEE COMPLETE 4+V*R*
4 series · 4 of 4 positions shown · non-contrast
Comparison: 07/31/2017

CLINICAL DATA: Knee pain, previous BKA

EXAM:
RIGHT KNEE - COMPLETE 4+ VIEW

[knee ap]
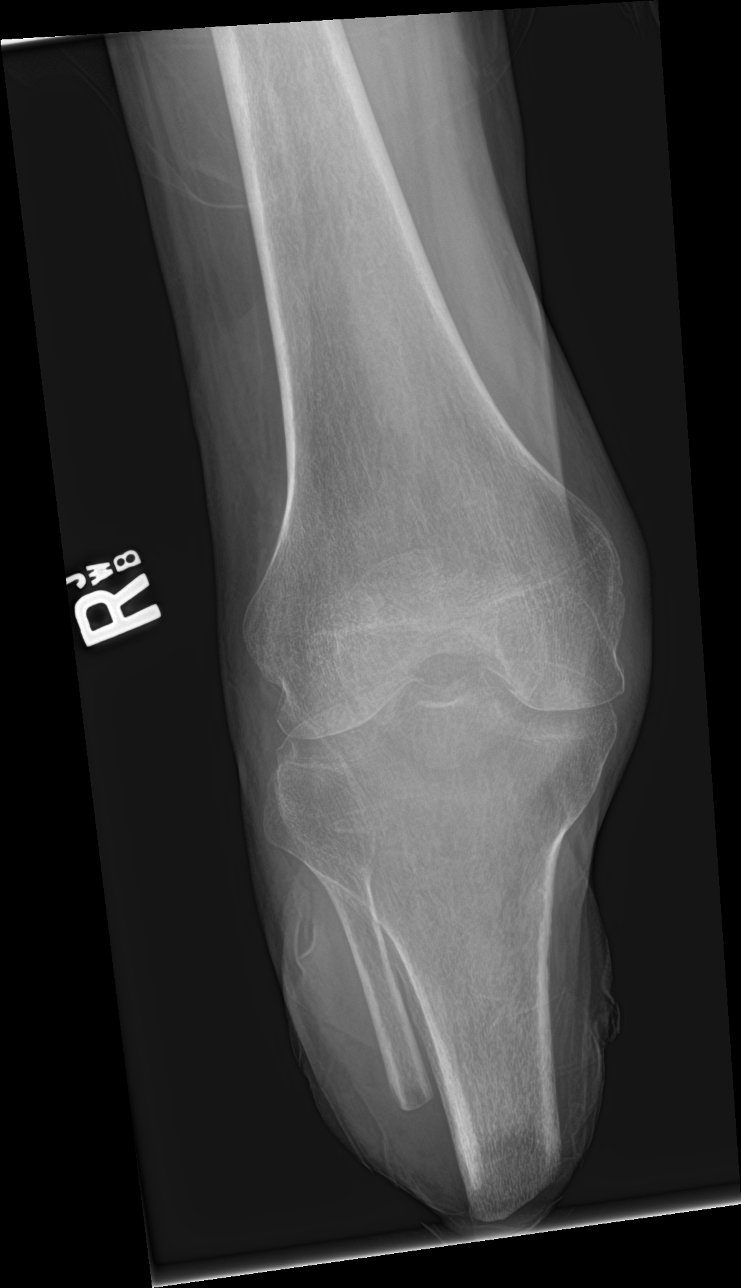

[knee lat]
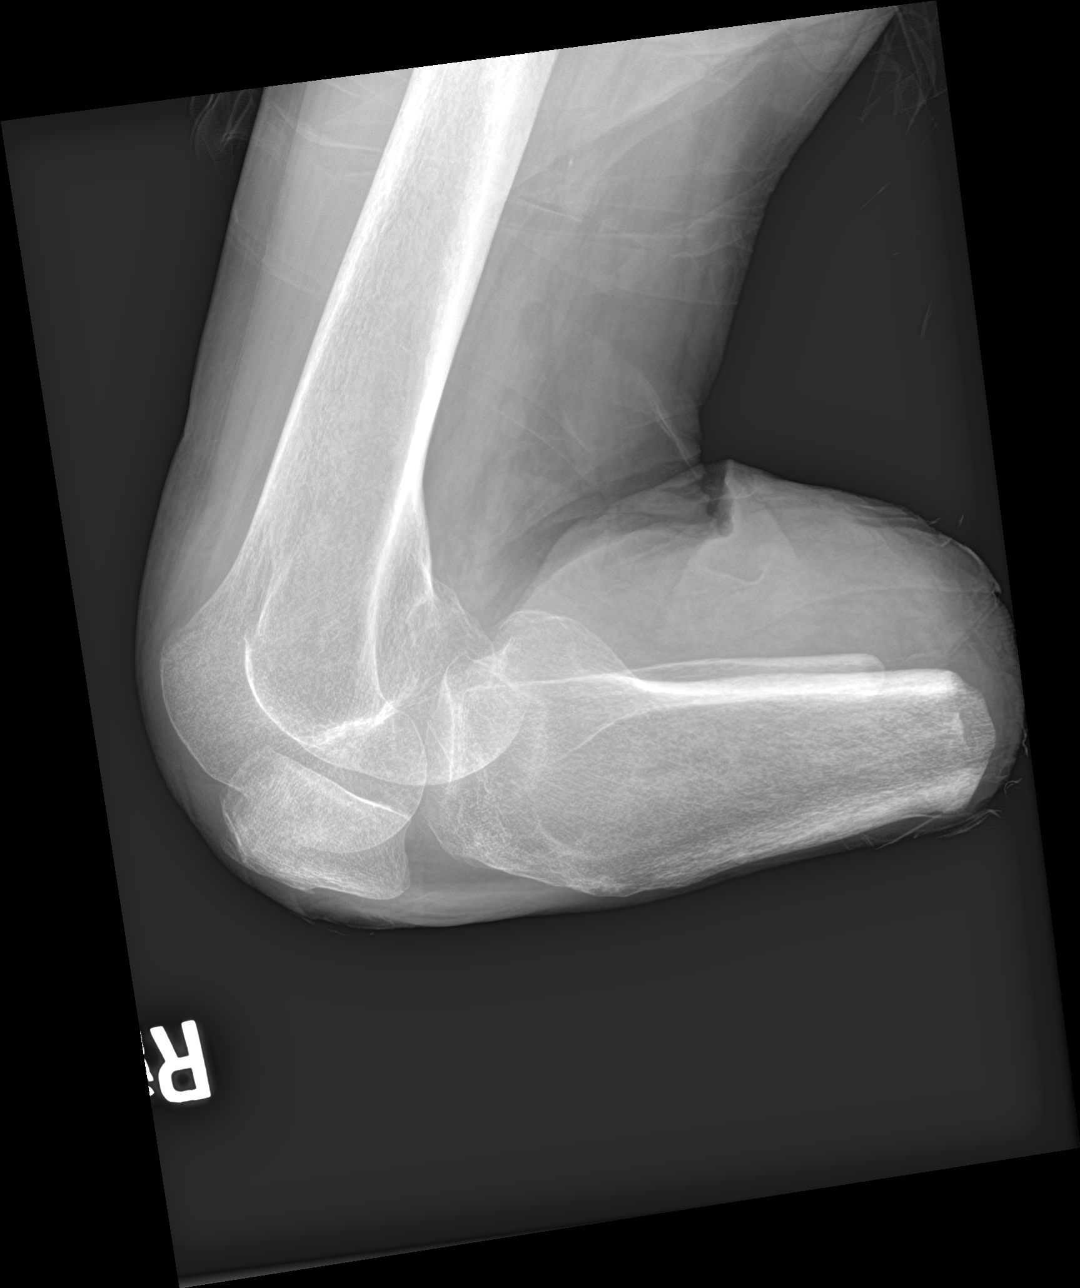

[knee obl (1 of 2)]
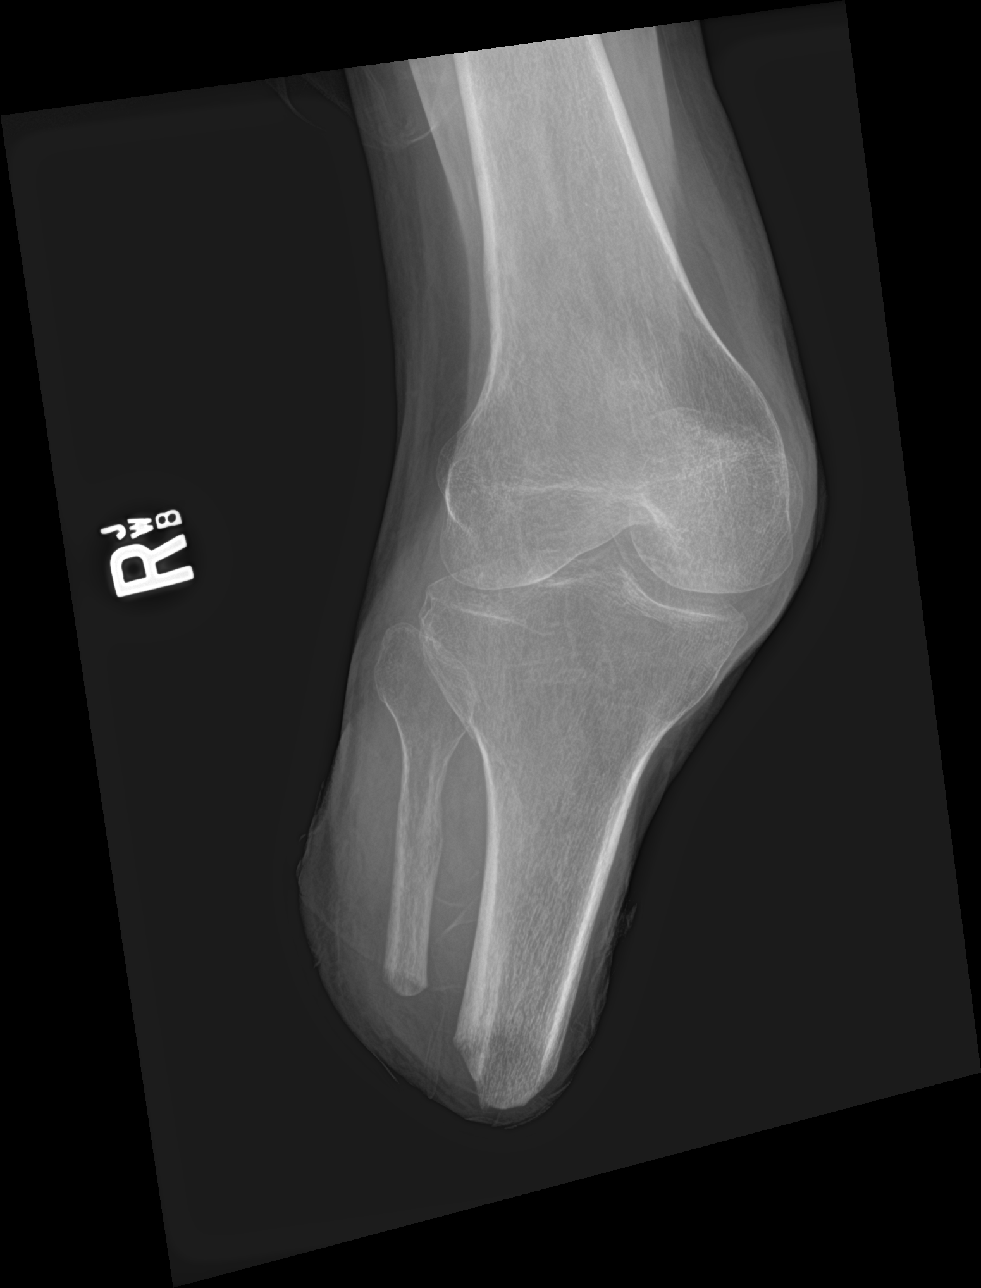

[knee obl (2 of 2)]
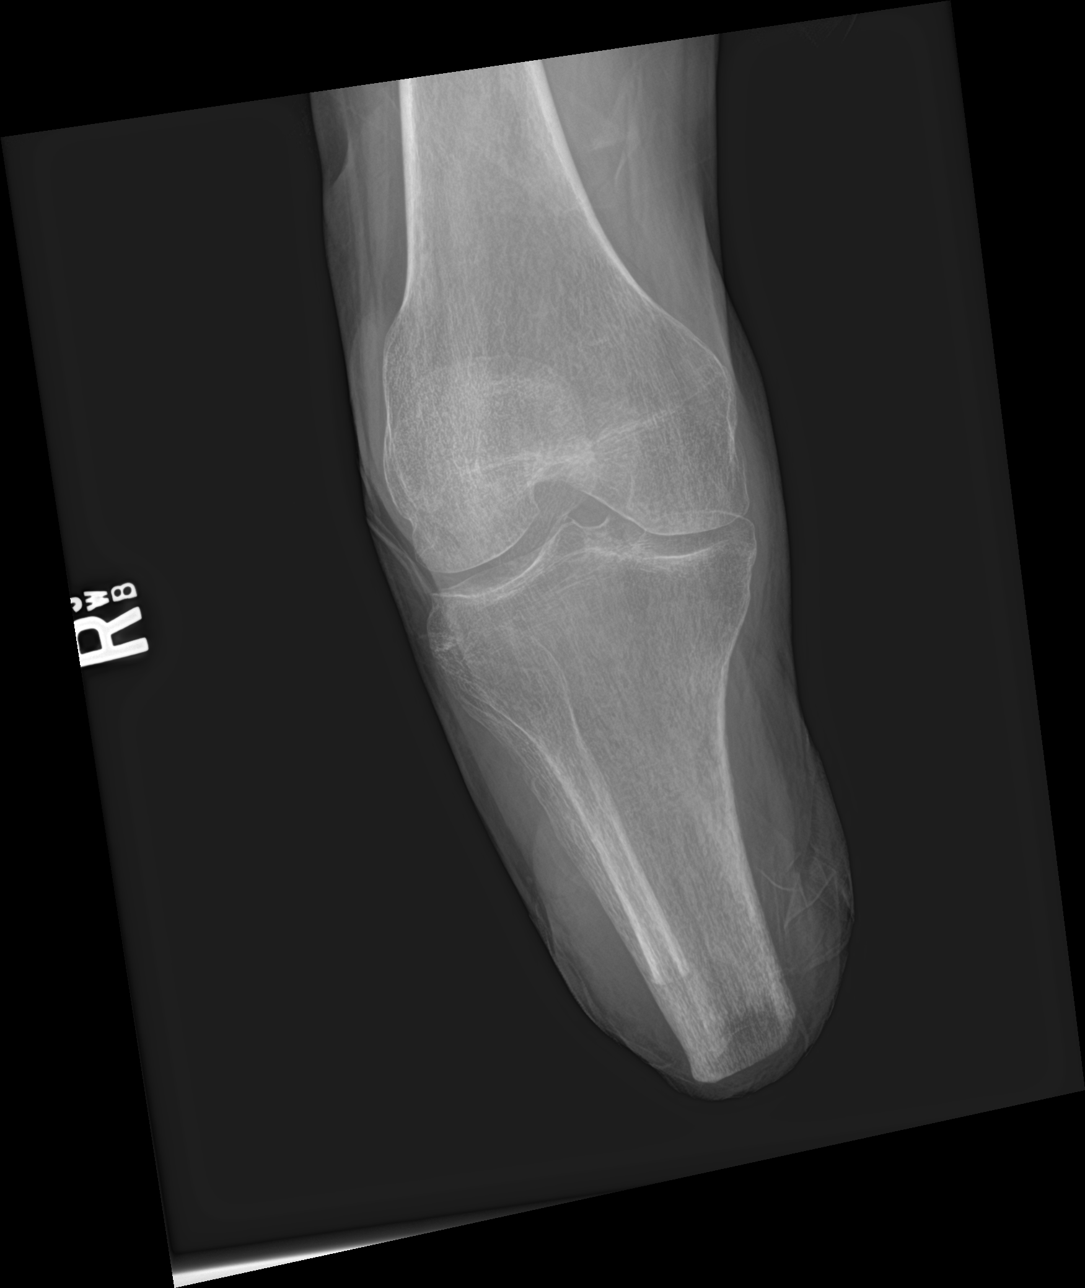

[4 of 4 positions shown; findings below may reference images not displayed]

FINDINGS: Remote right BKA. No acute osseous finding or fracture. No joint
effusion or joint abnormality. No definite soft tissue abnormality
by plain radiography.
IMPRESSION: Remote BKA.  No acute finding by plain radiography

## 2019-10-07 IMAGING — CR DG KNEE COMPLETE 4+V*L*
4 series · 4 of 4 positions shown · non-contrast
Comparison: 07/31/2017

CLINICAL DATA: Previous left BKA, pain

EXAM:
LEFT KNEE - COMPLETE 4+ VIEW

[knee ap]
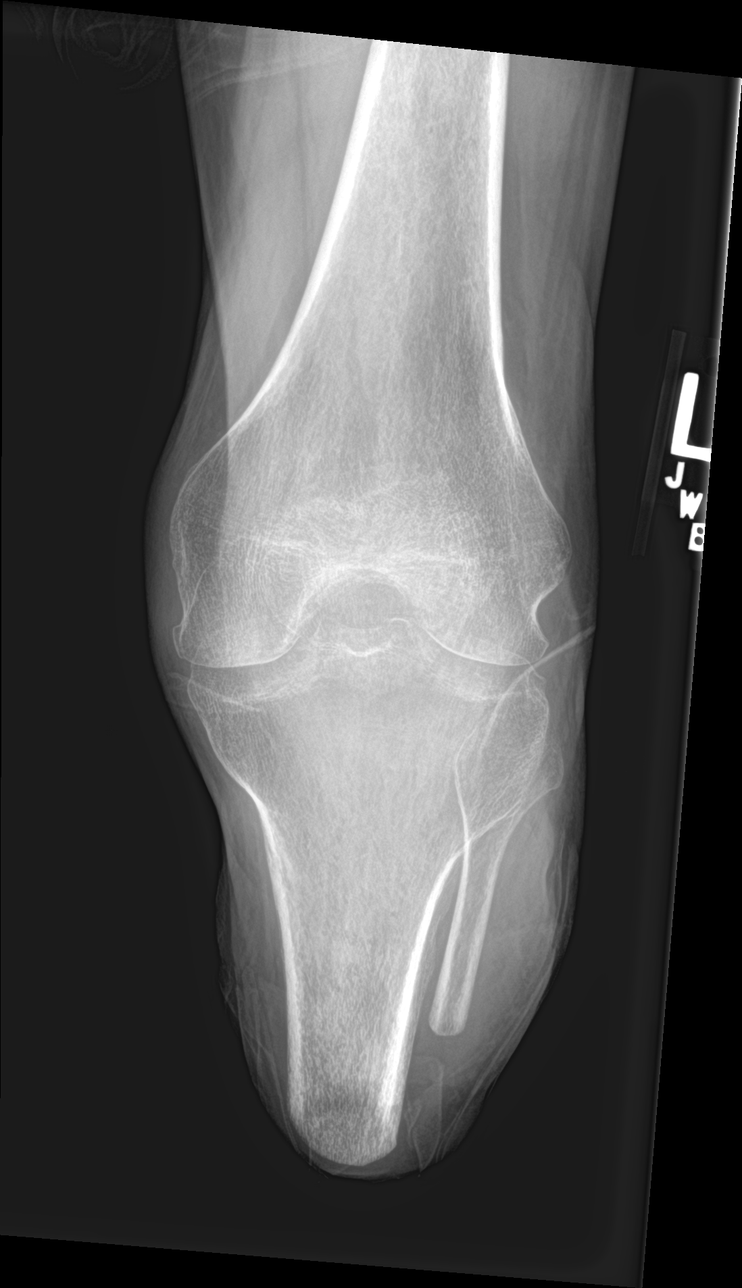

[tunnel]
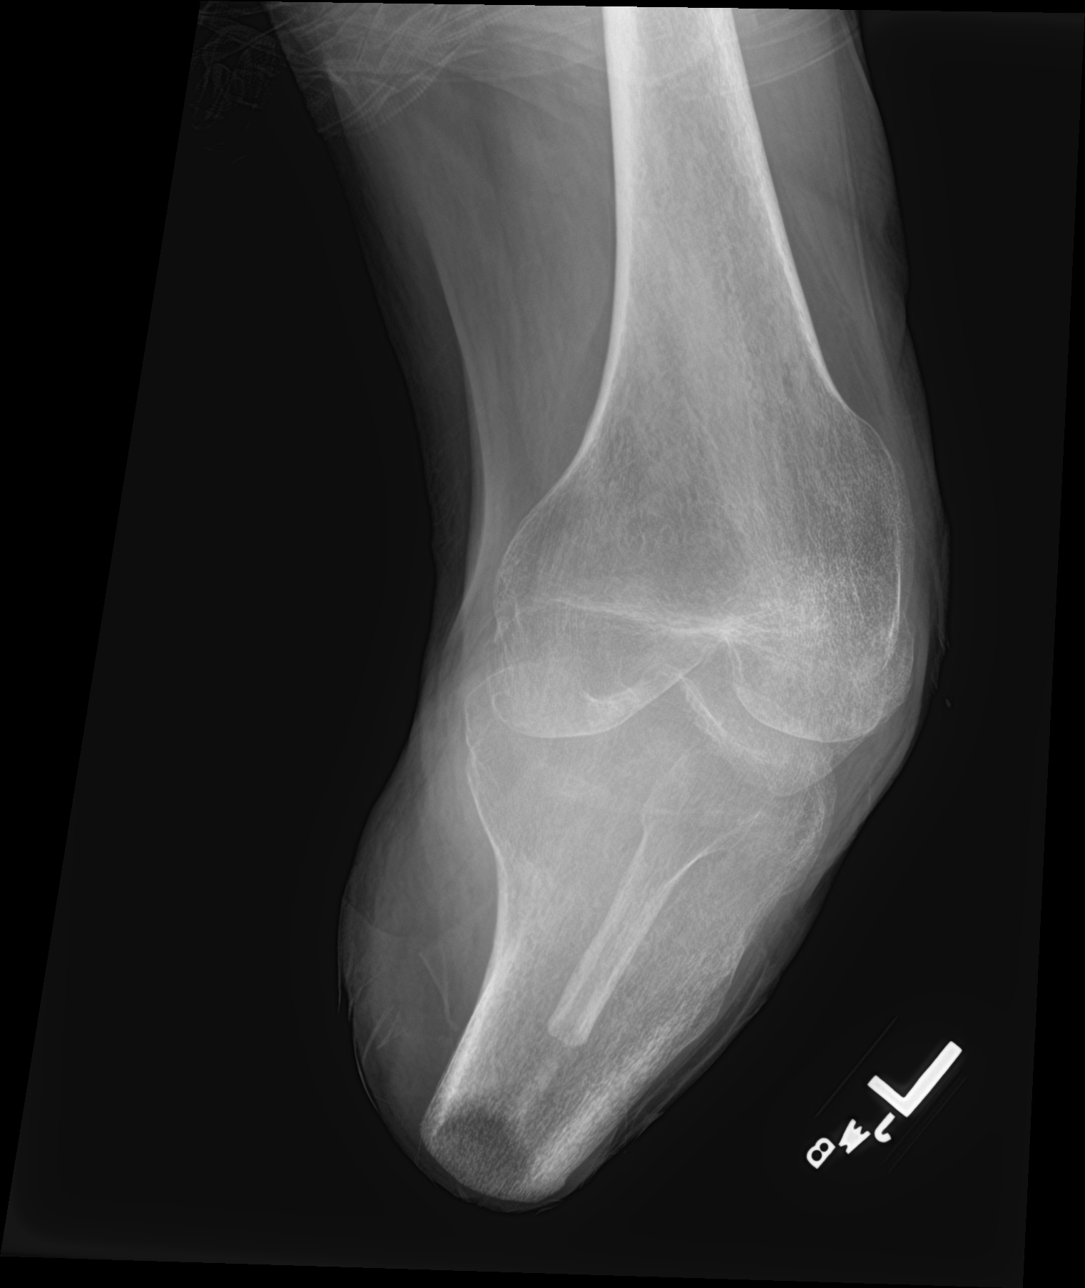

[knee lat]
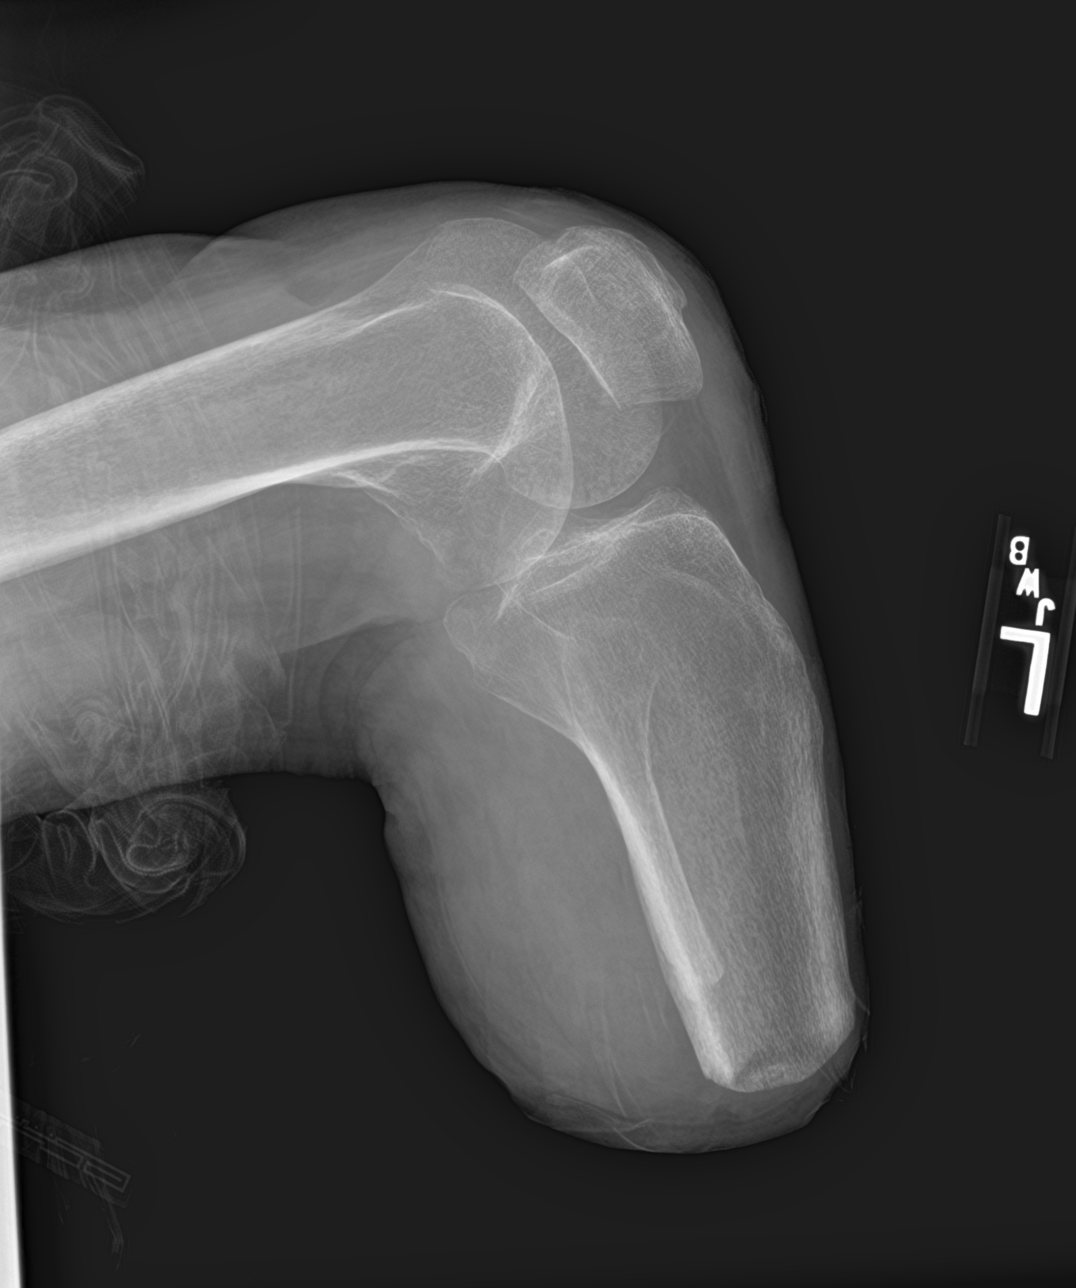

[knee obl]
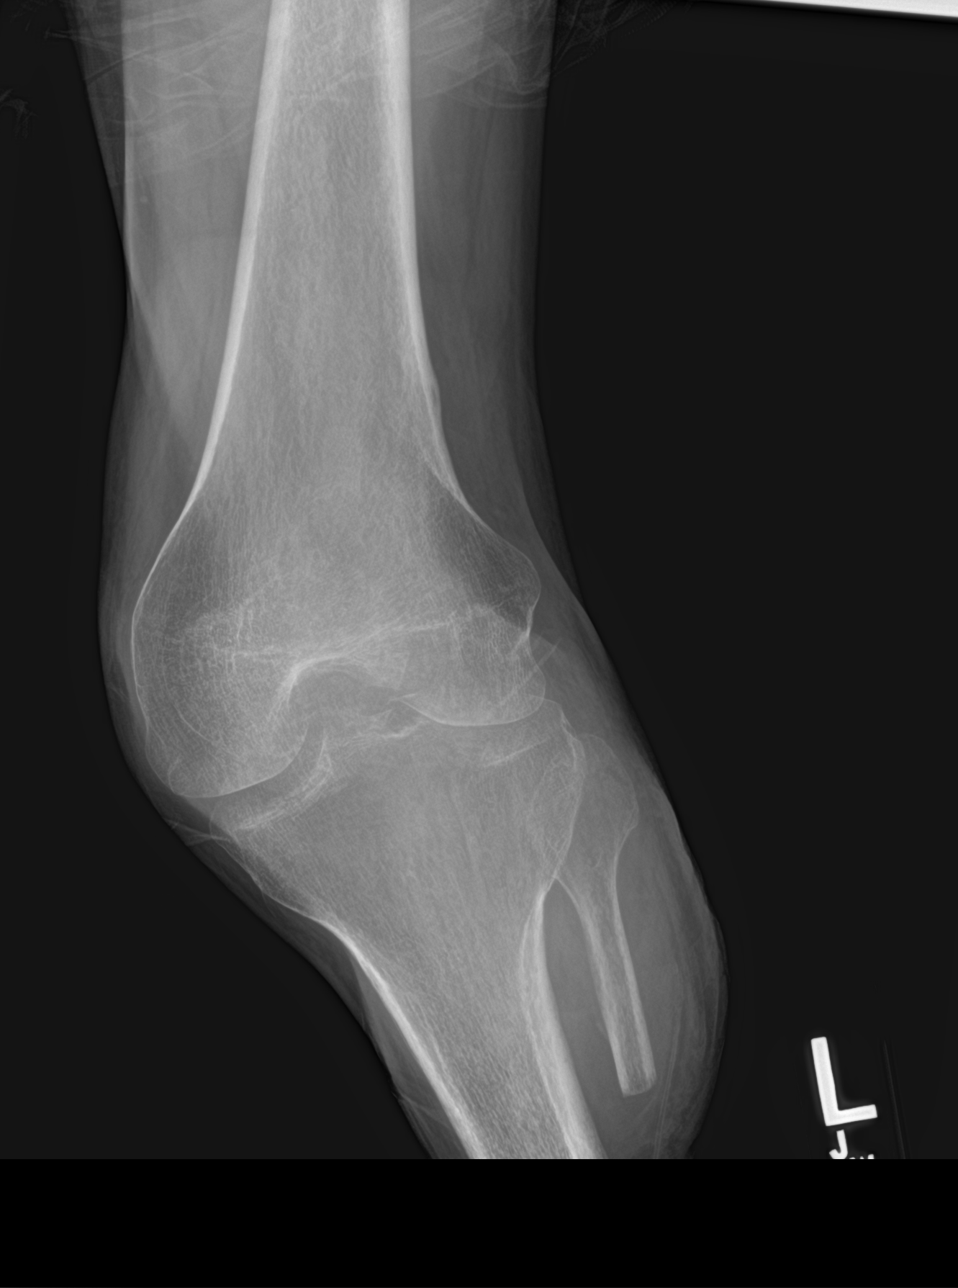

[4 of 4 positions shown; findings below may reference images not displayed]

FINDINGS: Remote left BKA changes. No acute osseous finding or fracture. No
joint abnormality or knee effusion. No significant soft tissue
finding by plain radiography.
IMPRESSION: Remote left BKA.  No acute osseous finding by plain radiography

## 2019-10-13 ENCOUNTER — Encounter (HOSPITAL_COMMUNITY): Payer: Self-pay | Admitting: Emergency Medicine

## 2019-10-13 ENCOUNTER — Other Ambulatory Visit: Payer: Self-pay

## 2019-10-13 ENCOUNTER — Emergency Department (HOSPITAL_COMMUNITY)
Admission: EM | Admit: 2019-10-13 | Discharge: 2019-10-13 | Disposition: A | Discharge status: Home / Self Care | Payer: Medicaid Other | Attending: Emergency Medicine | Admitting: Emergency Medicine

## 2019-10-13 DIAGNOSIS — Y658 Other specified misadventures during surgical and medical care: Secondary | ICD-10-CM | POA: Insufficient documentation

## 2019-10-13 DIAGNOSIS — F17210 Nicotine dependence, cigarettes, uncomplicated: Secondary | ICD-10-CM | POA: Insufficient documentation

## 2019-10-13 DIAGNOSIS — M79606 Pain in leg, unspecified: Secondary | ICD-10-CM

## 2019-10-13 DIAGNOSIS — M069 Rheumatoid arthritis, unspecified: Secondary | ICD-10-CM | POA: Insufficient documentation

## 2019-10-13 DIAGNOSIS — M79604 Pain in right leg: Secondary | ICD-10-CM | POA: Insufficient documentation

## 2019-10-13 DIAGNOSIS — I10 Essential (primary) hypertension: Secondary | ICD-10-CM | POA: Diagnosis not present

## 2019-10-13 DIAGNOSIS — M79605 Pain in left leg: Secondary | ICD-10-CM | POA: Diagnosis not present

## 2019-10-13 DIAGNOSIS — T8789 Other complications of amputation stump: Secondary | ICD-10-CM | POA: Diagnosis present

## 2019-10-13 DIAGNOSIS — Z79899 Other long term (current) drug therapy: Secondary | ICD-10-CM | POA: Diagnosis not present

## 2019-10-13 LAB — CBC WITH DIFFERENTIAL/PLATELET
Abs Immature Granulocytes: 0.02 10*3/uL (ref 0.00–0.07)
Basophils Absolute: 0 10*3/uL (ref 0.0–0.1)
Basophils Relative: 0 %
Eosinophils Absolute: 0.1 10*3/uL (ref 0.0–0.5)
Eosinophils Relative: 1 %
HCT: 36.5 % (ref 36.0–46.0)
Hemoglobin: 11.1 g/dL — ABNORMAL LOW (ref 12.0–15.0)
Immature Granulocytes: 0 %
Lymphocytes Relative: 38 %
Lymphs Abs: 1.8 10*3/uL (ref 0.7–4.0)
MCH: 26.1 pg (ref 26.0–34.0)
MCHC: 30.4 g/dL (ref 30.0–36.0)
MCV: 85.7 fL (ref 80.0–100.0)
Monocytes Absolute: 0.4 10*3/uL (ref 0.1–1.0)
Monocytes Relative: 8 %
Neutro Abs: 2.5 10*3/uL (ref 1.7–7.7)
Neutrophils Relative %: 53 %
Platelets: 331 10*3/uL (ref 150–400)
RBC: 4.26 MIL/uL (ref 3.87–5.11)
RDW: 17.6 % — ABNORMAL HIGH (ref 11.5–15.5)
WBC: 4.8 10*3/uL (ref 4.0–10.5)
nRBC: 0 % (ref 0.0–0.2)

## 2019-10-13 LAB — BASIC METABOLIC PANEL
Anion gap: 7 (ref 5–15)
BUN: 21 mg/dL — ABNORMAL HIGH (ref 6–20)
CO2: 22 mmol/L (ref 22–32)
Calcium: 8.9 mg/dL (ref 8.9–10.3)
Chloride: 108 mmol/L (ref 98–111)
Creatinine, Ser: 0.72 mg/dL (ref 0.44–1.00)
GFR calc Af Amer: >60 mL/min (ref >60)
GFR calc non Af Amer: >60 mL/min (ref >60)
Glucose, Bld: 89 mg/dL (ref 70–99)
Potassium: 3.7 mmol/L (ref 3.5–5.1)
Sodium: 137 mmol/L (ref 135–145)

## 2019-10-13 MED ORDER — OXYCODONE-ACETAMINOPHEN 5-325 MG PO TABS
2.0000 | ORAL_TABLET | Freq: Once | ORAL | Status: AC
  Administered 2019-10-13: 2 via ORAL
  Filled 2019-10-13: qty 2

## 2019-10-13 MED ORDER — OXYCODONE HCL 5 MG PO TABS: 5.0000 mg | ORAL_TABLET | Freq: Four times a day (QID) | ORAL | 0 refills | Status: DC | PRN

## 2019-10-13 NOTE — ED Notes (Signed)
PTAR was given paperwork needed, pt is leaving now to be transported home.

## 2019-10-13 NOTE — ED Notes (Signed)
PTAR has been contacted for pt, she is being assisted for toileting at this time.

## 2019-10-13 NOTE — ED Triage Notes (Signed)
Patient reports bilateral upper legs and arms pain onset this morning , denies injury .

## 2019-10-13 NOTE — Discharge Instructions (Addendum)
You were seen in the emergency department for pain of your legs and elbows.  There is no obvious evidence of infection.  Your lab work was unremarkable.  We prescribed you some pain medication to use at home.  Please contact your primary care doctor for close follow-up.

## 2019-10-13 NOTE — ED Provider Notes (Signed)
[facility] EMERGENCY DEPARTMENT Provider Note   CSN: [id number] Arrival date & time: 10/13/19  0450     History Chief Complaint  Patient presents with  . Leg Pain    [name] is a 59 y.o. female.  She has history of chronic pain and uses oxycodone but is out of her medication.  She is complaining of pain in both her legs and her elbows and says she has a new rash that is breaking out since last night.  Denies any fever no chest pain nausea or vomiting.  The history is provided by the patient.  Leg Pain Location:  Leg Injury: no   Leg location:  L leg and R leg Pain details:    Quality:  Aching   Severity:  Severe   Onset quality:  Gradual   Timing:  Constant   Progression:  Unchanged Chronicity:  Recurrent Dislocation: no   Relieved by:  Nothing Worsened by:  Nothing Ineffective treatments:  None tried Associated symptoms: no fever, no neck pain and no swelling        Past Medical History:  Diagnosis Date  . CAP (community acquired pneumonia) 03/2014 X 2  . Cocaine abuse ([place])    ongoing with resultant vaculitis.  . Depression   . Headache    "weekly" (07/29/2016)  . Hypertension   . Inflammatory arthritis   . Migraines    "probably 5-6/yr" (07/29/2016)  . Normocytic anemia    BL Hgb 9.8-12. Last anemia panel 04/2010 - showing Fe 19, ferritin 101.  Pt on monthly B12 injections  . Rheumatoid arthritis(714.0)    patient reported  . S/P bilateral below knee amputation ([place]) 04/13/2017  . 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 Followed by Dr. Louanne Skye    Patient Active Problem List   Diagnosis Date Noted  . Stump pain ([place]) 09/27/2019  . Anemia   . Cellulitis 09/26/2019  . Hyperglycemia 08/27/2019  . Disability of walking 05/28/2019  . At risk for adverse drug event 08/30/2018  . Microcytic hypochromic anemia 08/30/2018  . Thrombocytopenia ([place]) 08/20/2018  . Toxic encephalopathy  08/20/2018  . Goals of care, counseling/discussion   . Palliative care by specialist   . DNR (do not resuscitate) discussion   . Infiltrate noted on imaging study   . Ulcer of right thigh, limited to breakdown of skin ([place])   . S/P bilateral above knee amputation ([place])   . Homelessness 07/30/2018  . Elevated LFTs 07/30/2018  . Osteomyelitis ([place]) 07/30/2018  . Acute encephalopathy 07/30/2018  . Eye swelling, bilateral 07/30/2018  . Skin rash 07/11/2018  . Acute on chronic diastolic CHF (congestive heart failure) ([place]) 06/21/2018  . Malnutrition of moderate degree 05/20/2018  . Acute congestive heart failure ([place])   . Suicidal ideation 02/02/2018  . HCAP (healthcare-associated pneumonia) 02/02/2018  . Acute on chronic respiratory failure with hypoxia ([place]) 02/02/2018  . Sepsis ([place]) 02/02/2018  . Ulcer of amputation stump of lower extremity ([place]) 10/23/2017  . Cocaine abuse with cocaine-induced mood disorder ([place]) 08/10/2017  . Hypertensive crisis   . Phantom limb pain ([place])   . Tobacco abuse   . Amputation stump pain ([place])   . Acute blood loss anemia   . 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 AKI (acute kidney injury) ([place]) 02/07/2017  . Acute kidney injury ([place]) 02/06/2017  . Wound healing, delayed   .  Chest pain 04/07/2015  . Protein-calorie malnutrition, severe ([place]) 01/15/2015  . Cocaine abuse ([place])   . MDD (major depressive disorder), recurrent episode, severe ([place]) 10/16/2014  . Cocaine use disorder, severe, dependence ([place]) 10/10/2014  . 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 Essential hypertension 02/26/2010    Past Surgical History:  Procedure Laterality Date  . AMPUTATION Left 05/22/2016   Procedure: AMPUTATION LEFT LONG FINGER;  Surgeon: Marybelle Killings, MD;  Location: [facility];  Service: Orthopedics;  Laterality: Left;  . AMPUTATION Bilateral 04/10/2017   Procedure: AMPUTATION BELOW KNEE;  Surgeon: Newt Minion, MD;  Location: [facility];  Service: Orthopedics;  Laterality: Bilateral;  . AMPUTATION Bilateral 02/06/2018   Procedure: AMPUTATION ABOVE KNEE;  Surgeon: Newt Minion, MD;  Location: [facility];  Service: Orthopedics;  Laterality: Bilateral;  . HERNIA REPAIR     "stomach"  . I & D EXTREMITY Right 09/26/2015   Procedure: IRRIGATION AND DEBRIDEMENT LEG WOUND  VAC PLACEMENT.;  Surgeon: Loel Lofty Dillingham, DO;  Location: [facility];  Service: Plastics;  Laterality: Right;  . INCISION AND DRAINAGE OF WOUND Bilateral 10/20/2016   Procedure: IRRIGATION AND DEBRIDEMENT WOUND BILATERAL;  Surgeon: Edrick Kins, DPM;  Location: [facility];  Service: Podiatry;  Laterality: Bilateral;  . IRRIGATION AND DEBRIDEMENT ABSCESS Bilateral 09/26/2013   Procedure: DEBRIDEMENT ULCERS BILATERAL THIGHS;  Surgeon: Gwenyth Ober, MD;  Location: [facility];  Service: General;  Laterality: Bilateral;  . SKIN BIOPSY Bilateral    shin nodules     OB History    Gravida  0   Para  0   Term  0   Preterm  0   AB  0   Living  0     SAB  0   TAB  0   Ectopic  0   Multiple  0   Live Births  0           Family History  Problem Relation Age of Onset  . Breast cancer Mother        Breast cancer  . Alcohol abuse Mother   . Colon cancer Maternal Aunt 17  . Alcohol abuse Father     Social History   Tobacco Use  . Smoking status: Current Every Day Smoker    Packs/day: 0.12    Years: 38.00    Pack years: 4.56    Types: Cigarettes  . Smokeless tobacco: Never Used  . Tobacco comment: 2 A DAY  Substance Use Topics  . Alcohol use: No    Alcohol/week: 0.0 standard drinks  . Drug use: Yes    Types: "Crack" cocaine, Cocaine    Comment: 08/19/2019    Home Medications Prior to Admission medications   Medication Sig Start Date End Date Taking? Authorizing Provider  amLODipine (NORVASC) 10 MG tablet Take 1 tablet (10 mg total) by mouth daily. 08/25/19   Azzie Glatter, FNP  nystatin cream (MYCOSTATIN) Apply  to affected area 2 times daily Patient taking differently: Apply 1 application topically 2 (two) times daily as needed (cellulitis).  09/18/19   Lacretia Leigh, MD  oxyCODONE (OXY IR/ROXICODONE) 5 MG immediate release tablet Take 1 tablet (5 mg total) by mouth every 4 (four) hours as needed for moderate pain. 09/28/19   Jeanmarie Hubert, MD  polyethylene glycol (MIRALAX / GLYCOLAX) 17 g packet Take 17 g by mouth daily. 09/30/19   Jeanmarie Hubert, MD    Allergies    Levamisole, Acetaminophen, Haldol [haloperidol lactate], Lisinopril,  Toradol [ketorolac tromethamine], and Tramadol  Review of Systems   Review of Systems  Constitutional: Negative for fever.  HENT: Negative for sore throat.   Eyes: Negative for visual disturbance.  Respiratory: Negative for shortness of breath.   Cardiovascular: Negative for chest pain.  Gastrointestinal: Negative for abdominal pain.  Genitourinary: Negative for dysuria.  Musculoskeletal: Negative for neck pain.  Skin: Positive for rash.  Neurological: Negative for headaches.    Physical Exam Updated Vital Signs BP (!) 174/87 (BP Location: Right Arm)   Pulse 84   Temp 97.8 F (36.6 C) (Oral)   Resp 16   SpO2 99%   Physical Exam Vitals and nursing note reviewed.  Constitutional:      General: She is not in acute distress.    Appearance: She is well-developed.  HENT:     Head: Normocephalic and atraumatic.  Eyes:     Conjunctiva/sclera: Conjunctivae normal.  Cardiovascular:     Rate and Rhythm: Normal rate and regular rhythm.     Heart sounds: No murmur.  Pulmonary:     Effort: Pulmonary effort is normal. No respiratory distress.     Breath sounds: Normal breath sounds.  Abdominal:     Palpations: Abdomen is soft.     Tenderness: There is no abdominal tenderness.  Musculoskeletal:        General: Tenderness present. No swelling. Normal range of motion.     Cervical back: Neck supple.     Comments: She has bilateral AKA's.  There is no  evidence of any cellulitis.  There is no drainage.  She had multiple crusting lesions over her lower extremities and upper extremities.  Skin:    General: Skin is warm and dry.     Capillary Refill: Capillary refill takes less than 2 seconds.  Neurological:     General: No focal deficit present.     Mental Status: She is alert.     ED Results / Procedures / Treatments   Labs (all labs ordered are listed, but only abnormal results are displayed) Labs Reviewed  CBC WITH DIFFERENTIAL/PLATELET - Abnormal; Notable for the following components:      Result Value   Hemoglobin 11.1 (*)    RDW 17.6 (*)    All other components within normal limits  BASIC METABOLIC PANEL - Abnormal; Notable for the following components:   BUN 21 (*)    All other components within normal limits    EKG None  Radiology No results found.  Procedures Procedures (including critical care time)  Medications Ordered in ED Medications  oxyCODONE-acetaminophen (PERCOCET/ROXICET) 5-325 MG per tablet 2 tablet (has no administration in time range)    ED Course  I have reviewed the triage vital signs and the nursing notes.  Pertinent labs & imaging results that were available during my care of the patient were reviewed by me and considered in my medical decision making (see chart for details).  Clinical Course as of Oct 12 1710  Fri Oct 13, 2019  0720 Patient here with complaints of painful rash over her bilateral legs and elbows.  On review of prior notes she has been seen for same.  Has been treated for infections and was admitted for elevated lactate about 2 weeks ago.  There is no overt evidence of any cellulitis.  She is afebrile here with normal white count.  Her gap is normal here so doubt significantly elevated lactate will treat compassionately with some pain medicine.  No evidence of any need  for antibiotics.   [MB]  [id number] Patient received some pain medication here.  She did not bring a wheelchair so  probably will need ambulance to return home.   [MB]    Clinical Course User Index [MB] Hayden Rasmussen, MD   MDM Rules/Calculators/A&P                      Final Clinical Impression(s) / ED Diagnoses Final diagnoses:  Pain of amputation stump of lower extremity ([place])    Rx / DC Orders ED Discharge Orders         Ordered    oxyCODONE (OXY IR/ROXICODONE) 5 MG immediate release tablet  Every 6 hours PRN     10/13/19 0749           Hayden Rasmussen, MD 10/13/19 1713

## 2019-10-13 NOTE — ED Notes (Signed)
Pt in audible pain, comfort measures in place. Warm blankets given, pillow placed under leg stubs. Pt continues to call out in pain, RNs aware. Awaiting MD orders.

## 2019-10-31 ENCOUNTER — Encounter (HOSPITAL_COMMUNITY): Payer: Self-pay | Admitting: Emergency Medicine

## 2019-10-31 ENCOUNTER — Emergency Department (HOSPITAL_COMMUNITY)
Admission: EM | Admit: 2019-10-31 | Discharge: 2019-11-01 | Disposition: A | Discharge status: Home / Self Care | Payer: Medicaid Other | Attending: Emergency Medicine | Admitting: Emergency Medicine

## 2019-10-31 ENCOUNTER — Other Ambulatory Visit: Payer: Self-pay

## 2019-10-31 DIAGNOSIS — Z89611 Acquired absence of right leg above knee: Secondary | ICD-10-CM | POA: Insufficient documentation

## 2019-10-31 DIAGNOSIS — L089 Local infection of the skin and subcutaneous tissue, unspecified: Secondary | ICD-10-CM | POA: Insufficient documentation

## 2019-10-31 DIAGNOSIS — I5033 Acute on chronic diastolic (congestive) heart failure: Secondary | ICD-10-CM | POA: Diagnosis not present

## 2019-10-31 DIAGNOSIS — Z888 Allergy status to other drugs, medicaments and biological substances status: Secondary | ICD-10-CM | POA: Insufficient documentation

## 2019-10-31 DIAGNOSIS — M069 Rheumatoid arthritis, unspecified: Secondary | ICD-10-CM | POA: Diagnosis not present

## 2019-10-31 DIAGNOSIS — Z885 Allergy status to narcotic agent status: Secondary | ICD-10-CM | POA: Insufficient documentation

## 2019-10-31 DIAGNOSIS — Z886 Allergy status to analgesic agent status: Secondary | ICD-10-CM | POA: Diagnosis not present

## 2019-10-31 DIAGNOSIS — G8929 Other chronic pain: Secondary | ICD-10-CM | POA: Insufficient documentation

## 2019-10-31 DIAGNOSIS — Z79899 Other long term (current) drug therapy: Secondary | ICD-10-CM | POA: Insufficient documentation

## 2019-10-31 DIAGNOSIS — I110 Hypertensive heart disease with heart failure: Secondary | ICD-10-CM | POA: Diagnosis not present

## 2019-10-31 DIAGNOSIS — Z89612 Acquired absence of left leg above knee: Secondary | ICD-10-CM | POA: Diagnosis not present

## 2019-10-31 DIAGNOSIS — F17210 Nicotine dependence, cigarettes, uncomplicated: Secondary | ICD-10-CM | POA: Diagnosis not present

## 2019-10-31 DIAGNOSIS — M79604 Pain in right leg: Secondary | ICD-10-CM | POA: Diagnosis present

## 2019-10-31 DIAGNOSIS — M79605 Pain in left leg: Secondary | ICD-10-CM

## 2019-10-31 MED ORDER — OXYCODONE-ACETAMINOPHEN 5-325 MG PO TABS
1.0000 | ORAL_TABLET | Freq: Once | ORAL | Status: AC
  Administered 2019-10-31: 1 via ORAL
  Filled 2019-10-31: qty 1

## 2019-10-31 MED ORDER — AMLODIPINE BESYLATE 5 MG PO TABS
10.0000 mg | ORAL_TABLET | Freq: Once | ORAL | Status: AC
  Administered 2019-10-31: 10 mg via ORAL
  Filled 2019-10-31: qty 2

## 2019-10-31 MED ORDER — DOXYCYCLINE HYCLATE 100 MG PO CAPS: 100.0000 mg | ORAL_CAPSULE | Freq: Two times a day (BID) | ORAL | 0 refills | Status: DC

## 2019-10-31 MED ORDER — ONDANSETRON 4 MG PO TBDP
4.0000 mg | ORAL_TABLET | Freq: Once | ORAL | Status: AC
  Administered 2019-10-31: 4 mg via ORAL
  Filled 2019-10-31: qty 1

## 2019-10-31 MED ORDER — HYDROMORPHONE HCL 1 MG/ML IJ SOLN
1.0000 mg | Freq: Once | INTRAMUSCULAR | Status: AC
  Administered 2019-10-31: 1 mg via INTRAMUSCULAR
  Filled 2019-10-31: qty 1

## 2019-10-31 MED ORDER — DOXYCYCLINE HYCLATE 100 MG PO TABS
100.0000 mg | ORAL_TABLET | Freq: Once | ORAL | Status: AC
  Administered 2019-10-31: 100 mg via ORAL
  Filled 2019-10-31: qty 1

## 2019-10-31 MED ORDER — LIDOCAINE 5 % EX OINT: 1.0000 "application " | TOPICAL_OINTMENT | CUTANEOUS | 0 refills | Status: DC | PRN

## 2019-10-31 NOTE — Discharge Instructions (Addendum)
Take the antibiotics as prescribed. You can use lidocaine ointment to help with discomfort. Return to the ED if you start to develop worsening symptoms, fever, injuries or falls, chest pain or shortness of breath.

## 2019-10-31 NOTE — ED Provider Notes (Signed)
[facility] EMERGENCY DEPARTMENT Provider Note   CSN: [id number] Arrival date & time: 10/31/19  1709     History No chief complaint on file.   [name] is a 59 y.o. female with a past medical history of substance abuse, inflammatory arthritis, status post bilateral AKA's presenting to the ED with a chief complaint of stump pain.  Reports ongoing stump pain in her bilateral lower extremities but worsened today.  She did note some draining wounds in the area as well.  States that she ran out of her pain medication and the pain has worsened since then.  Reports chills as well.  Does note generalized body aches as well.  Reports nausea but denies any injuries or falls.  Denies any shortness of breath, sick contacts with similar symptoms, abdominal pain, chest pain or cough.  HPI     Past Medical History:  Diagnosis Date  . CAP (community acquired pneumonia) 03/2014 X 2  . Cocaine abuse ([place])    ongoing with resultant vaculitis.  . Depression   . Headache    "weekly" (07/29/2016)  . Hypertension   . Inflammatory arthritis   . Migraines    "probably 5-6/yr" (07/29/2016)  . Normocytic anemia    BL Hgb 9.8-12. Last anemia panel 04/2010 - showing Fe 19, ferritin 101.  Pt on monthly B12 injections  . Rheumatoid arthritis(714.0)    patient reported  . S/P bilateral below knee amputation ([place]) 04/13/2017  . 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 Followed by Dr. Louanne Skye    Patient Active Problem List   Diagnosis Date Noted  . Stump pain ([place]) 09/27/2019  . Anemia   . Cellulitis 09/26/2019  . Hyperglycemia 08/27/2019  . Disability of walking 05/28/2019  . At risk for adverse drug event 08/30/2018  . Microcytic hypochromic anemia 08/30/2018  . Thrombocytopenia ([place]) 08/20/2018  . Toxic encephalopathy 08/20/2018  . Goals of care, counseling/discussion   . Palliative care by specialist   . DNR (do not  resuscitate) discussion   . Infiltrate noted on imaging study   . Ulcer of right thigh, limited to breakdown of skin ([place])   . S/P bilateral above knee amputation ([place])   . Homelessness 07/30/2018  . Elevated LFTs 07/30/2018  . Osteomyelitis ([place]) 07/30/2018  . Acute encephalopathy 07/30/2018  . Eye swelling, bilateral 07/30/2018  . Skin rash 07/11/2018  . Acute on chronic diastolic CHF (congestive heart failure) ([place]) 06/21/2018  . Malnutrition of moderate degree 05/20/2018  . Acute congestive heart failure ([place])   . Suicidal ideation 02/02/2018  . HCAP (healthcare-associated pneumonia) 02/02/2018  . Acute on chronic respiratory failure with hypoxia ([place]) 02/02/2018  . Sepsis ([place]) 02/02/2018  . Ulcer of amputation stump of lower extremity ([place]) 10/23/2017  . Cocaine abuse with cocaine-induced mood disorder ([place]) 08/10/2017  . Hypertensive crisis   . Phantom limb pain ([place])   . Tobacco abuse   . Amputation stump pain ([place])   . Acute blood loss anemia   . 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 AKI (acute kidney injury) ([place]) 02/07/2017  . Acute kidney injury ([place]) 02/06/2017  . Wound healing, delayed   . Chest pain 04/07/2015  . Protein-calorie malnutrition, severe ([place]) 01/15/2015  . Cocaine abuse ([place])   . MDD (major depressive disorder), recurrent episode, severe ([place]) 10/16/2014  . Cocaine use disorder, severe, dependence ([place]) 10/10/2014  .  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 Essential hypertension 02/26/2010    Past Surgical History:  Procedure Laterality Date  . AMPUTATION Left 05/22/2016   Procedure: AMPUTATION LEFT LONG FINGER;  Surgeon: Marybelle Killings, MD;  Location: [facility];  Service: Orthopedics;  Laterality: Left;  . AMPUTATION Bilateral 04/10/2017   Procedure: AMPUTATION BELOW KNEE;  Surgeon: Newt Minion, MD;  Location: [facility];  Service: Orthopedics;  Laterality: Bilateral;  . AMPUTATION Bilateral  02/06/2018   Procedure: AMPUTATION ABOVE KNEE;  Surgeon: Newt Minion, MD;  Location: [facility];  Service: Orthopedics;  Laterality: Bilateral;  . HERNIA REPAIR     "stomach"  . I & D EXTREMITY Right 09/26/2015   Procedure: IRRIGATION AND DEBRIDEMENT LEG WOUND  VAC PLACEMENT.;  Surgeon: Loel Lofty Dillingham, DO;  Location: [facility];  Service: Plastics;  Laterality: Right;  . INCISION AND DRAINAGE OF WOUND Bilateral 10/20/2016   Procedure: IRRIGATION AND DEBRIDEMENT WOUND BILATERAL;  Surgeon: Edrick Kins, DPM;  Location: [facility];  Service: Podiatry;  Laterality: Bilateral;  . IRRIGATION AND DEBRIDEMENT ABSCESS Bilateral 09/26/2013   Procedure: DEBRIDEMENT ULCERS BILATERAL THIGHS;  Surgeon: Gwenyth Ober, MD;  Location: [facility];  Service: General;  Laterality: Bilateral;  . SKIN BIOPSY Bilateral    shin nodules     OB History    Gravida  0   Para  0   Term  0   Preterm  0   AB  0   Living  0     SAB  0   TAB  0   Ectopic  0   Multiple  0   Live Births  0           Family History  Problem Relation Age of Onset  . Breast cancer Mother        Breast cancer  . Alcohol abuse Mother   . Colon cancer Maternal Aunt 55  . Alcohol abuse Father     Social History   Tobacco Use  . Smoking status: Current Every Day Smoker    Packs/day: 0.12    Years: 38.00    Pack years: 4.56    Types: Cigarettes  . Smokeless tobacco: Never Used  . Tobacco comment: 2 A DAY  Substance Use Topics  . Alcohol use: No    Alcohol/week: 0.0 standard drinks  . Drug use: Yes    Types: "Crack" cocaine, Cocaine    Comment: 08/19/2019    Home Medications Prior to Admission medications   Medication Sig Start Date End Date Taking? Authorizing Provider  amLODipine (NORVASC) 10 MG tablet Take 1 tablet (10 mg total) by mouth daily. 08/25/19   Azzie Glatter, FNP  doxycycline (VIBRAMYCIN) 100 MG capsule Take 1 capsule (100 mg total) by mouth 2 (two) times daily for 7 days. 10/31/19 11/07/19  [name] Cleckler,  Criss Bartles, PA-C  lidocaine (XYLOCAINE) 5 % ointment Apply 1 application topically as needed. 10/31/19   Shon Mansouri, PA-C  nystatin cream (MYCOSTATIN) Apply to affected area 2 times daily Patient taking differently: Apply 1 application topically 2 (two) times daily as needed (cellulitis).  09/18/19   Lacretia Leigh, MD  oxyCODONE (OXY IR/ROXICODONE) 5 MG immediate release tablet Take 1 tablet (5 mg total) by mouth every 6 (six) hours as needed for moderate pain. 10/13/19   Hayden Rasmussen, MD  polyethylene glycol (MIRALAX / GLYCOLAX) 17 g packet Take 17 g by mouth daily. 09/30/19   Jeanmarie Hubert, MD    Allergies  Levamisole, Acetaminophen, Haldol [haloperidol lactate], Lisinopril, Toradol [ketorolac tromethamine], and Tramadol  Review of Systems   Review of Systems  Constitutional: Negative for appetite change, chills and fever.  HENT: Negative for ear pain, rhinorrhea, sneezing and sore throat.   Eyes: Negative for photophobia and visual disturbance.  Respiratory: Negative for cough, chest tightness, shortness of breath and wheezing.   Cardiovascular: Negative for chest pain and palpitations.  Gastrointestinal: Negative for abdominal pain, blood in stool, constipation, diarrhea, nausea and vomiting.  Genitourinary: Negative for dysuria, hematuria and urgency.  Musculoskeletal: Positive for myalgias. Negative for arthralgias.  Skin: Positive for wound. Negative for rash.  Neurological: Negative for dizziness, weakness and light-headedness.    Physical Exam Updated Vital Signs BP (!) 167/130   Pulse 96   Temp (!) 97.4 F (36.3 C) (Oral)   Resp 20   SpO2 97%   Physical Exam Vitals and nursing note reviewed.  Constitutional:      General: She is not in acute distress.    Appearance: She is well-developed.  HENT:     Head: Normocephalic and atraumatic.     Nose: Nose normal.  Eyes:     General: No scleral icterus.       Left eye: No discharge.     Conjunctiva/sclera:  Conjunctivae normal.  Cardiovascular:     Rate and Rhythm: Normal rate and regular rhythm.     Heart sounds: Normal heart sounds. No murmur. No friction rub. No gallop.   Pulmonary:     Effort: Pulmonary effort is normal. No respiratory distress.     Breath sounds: Normal breath sounds.  Abdominal:     General: Bowel sounds are normal. There is no distension.     Palpations: Abdomen is soft.     Tenderness: There is no abdominal tenderness. There is no guarding.  Musculoskeletal:        General: Normal range of motion.     Cervical back: Normal range of motion and neck supple.     Comments: Bilateral AKA's with scabbed over wounds and pustules present.  There are similar wounds noted on her left forearm.  No overlying erythema, purulent drainage or ulcers noted.  Skin:    General: Skin is warm and dry.     Findings: No rash.  Neurological:     Mental Status: She is alert.     Motor: No abnormal muscle tone.     Coordination: Coordination normal.     ED Results / Procedures / Treatments   Labs (all labs ordered are listed, but only abnormal results are displayed) Labs Reviewed - No data to display  EKG EKG Interpretation  Date/Time:  Tuesday October 31 2019 20:06:35 EST Ventricular Rate:  96 PR Interval:    QRS Duration: 98 QT Interval:  389 QTC Calculation: 492 R Axis:   90 Text Interpretation: Sinus rhythm Biatrial enlargement Consider RVH w/ secondary repol abnormality Left ventricular hypertrophy Borderline prolonged QT interval QT back to baseline Confirmed by Wandra Arthurs [phone]) on 10/31/2019 9:02:39 PM   Radiology No results found.  Procedures Procedures (including critical care time)  Medications Ordered in ED Medications  oxyCODONE-acetaminophen (PERCOCET/ROXICET) 5-325 MG per tablet 1 tablet (1 tablet Oral Given 10/31/19 1839)  doxycycline (VIBRA-TABS) tablet 100 mg (100 mg Oral Given 10/31/19 1839)  HYDROmorphone (DILAUDID) injection 1 mg (1 mg  Intramuscular Given 10/31/19 2043)  amLODipine (NORVASC) tablet 10 mg (10 mg Oral Given 10/31/19 2133)  ondansetron (ZOFRAN-ODT) disintegrating tablet 4 mg (4 mg  Oral Given 10/31/19 2149)    ED Course  I have reviewed the triage vital signs and the nursing notes.  Pertinent labs & imaging results that were available during my care of the patient were reviewed by me and considered in my medical decision making (see chart for details).    MDM Rules/Calculators/A&P                      59 year old female status post bilateral AKA's presenting to the ED for acute on chronic stump pain.  States that she has had pain for several months but worsened today.  She also reports wounds to bilateral lower extremities and forearm.  She is concerned that they could be infected.  On exam there are wounds noted which based on chart review do not appear different.  No overlying cellulitis.  Patient's pain controlled here in the ED.  She was given a dose of doxycycline for a potential infection as she appears prone to them and is worried about this as well.  She is hypertensive so gave her dose of her home amlodipine. EKG without changes from prior tracings. She will need to follow-up with PCP with continued antibiotics and lidocaine ointment for discomfort. She did have one episode of emesis here, given her Zofran with improvement.  Patient is hemodynamically stable, in NAD, and able to ambulate in the ED. Evaluation does not show pathology that would require ongoing emergent intervention or inpatient treatment. I explained the diagnosis to the patient. Pain has been managed and has no complaints prior to discharge. Patient is comfortable with above plan and is stable for discharge at this time. All questions were answered prior to disposition. Strict return precautions for returning to the ED were discussed. Encouraged follow up with PCP.   An After Visit Summary was printed and given to the patient.   Portions of  this note were generated with [employer]. Dictation errors may occur despite best attempts at proofreading.  Final Clinical Impression(s) / ED Diagnoses Final diagnoses:  Chronic pain of both lower extremities  Skin infection    Rx / DC Orders ED Discharge Orders         Ordered    lidocaine (XYLOCAINE) 5 % ointment  As needed     10/31/19 2109    doxycycline (VIBRAMYCIN) 100 MG capsule  2 times daily     10/31/19 2109           Delia Heady, PA-C 10/31/19 2233    Drenda Freeze, MD 10/31/19 2240

## 2019-10-31 NOTE — ED Notes (Signed)
Attempted to contact patient's brother Ailene Ards via the number listed in demographics; no response.

## 2019-10-31 NOTE — ED Triage Notes (Signed)
Pt arrives via EMS with reports of pain to bilateral BKAs. Pt states she is out of her pain medications. Tearful in triage.

## 2019-11-04 ENCOUNTER — Other Ambulatory Visit: Payer: Self-pay

## 2019-11-04 ENCOUNTER — Encounter (HOSPITAL_COMMUNITY): Payer: Self-pay | Admitting: Emergency Medicine

## 2019-11-04 ENCOUNTER — Inpatient Hospital Stay (HOSPITAL_COMMUNITY)
Admission: EM | Admit: 2019-11-04 | Discharge: 2019-11-10 | DRG: 853 | Disposition: A | Discharge status: Rehabilitation Facility | Payer: Medicaid Other | Attending: Internal Medicine | Admitting: Internal Medicine

## 2019-11-04 ENCOUNTER — Emergency Department (HOSPITAL_COMMUNITY): Payer: Medicaid Other

## 2019-11-04 DIAGNOSIS — Z4781 Encounter for orthopedic aftercare following surgical amputation: Secondary | ICD-10-CM | POA: Diagnosis not present

## 2019-11-04 DIAGNOSIS — M79609 Pain in unspecified limb: Secondary | ICD-10-CM | POA: Diagnosis not present

## 2019-11-04 DIAGNOSIS — E872 Acidosis: Secondary | ICD-10-CM | POA: Diagnosis present

## 2019-11-04 DIAGNOSIS — R5381 Other malaise: Secondary | ICD-10-CM | POA: Diagnosis not present

## 2019-11-04 DIAGNOSIS — Z681 Body mass index (BMI) 19 or less, adult: Secondary | ICD-10-CM | POA: Diagnosis not present

## 2019-11-04 DIAGNOSIS — R7401 Elevation of levels of liver transaminase levels: Secondary | ICD-10-CM | POA: Diagnosis not present

## 2019-11-04 DIAGNOSIS — G8929 Other chronic pain: Secondary | ICD-10-CM | POA: Diagnosis present

## 2019-11-04 DIAGNOSIS — K7460 Unspecified cirrhosis of liver: Secondary | ICD-10-CM | POA: Diagnosis present

## 2019-11-04 DIAGNOSIS — Z89612 Acquired absence of left leg above knee: Secondary | ICD-10-CM | POA: Diagnosis not present

## 2019-11-04 DIAGNOSIS — I5032 Chronic diastolic (congestive) heart failure: Secondary | ICD-10-CM | POA: Diagnosis present

## 2019-11-04 DIAGNOSIS — E43 Unspecified severe protein-calorie malnutrition: Secondary | ICD-10-CM | POA: Diagnosis present

## 2019-11-04 DIAGNOSIS — G894 Chronic pain syndrome: Secondary | ICD-10-CM

## 2019-11-04 DIAGNOSIS — T148XXA Other injury of unspecified body region, initial encounter: Secondary | ICD-10-CM | POA: Diagnosis not present

## 2019-11-04 DIAGNOSIS — E871 Hypo-osmolality and hyponatremia: Secondary | ICD-10-CM | POA: Diagnosis not present

## 2019-11-04 DIAGNOSIS — I10 Essential (primary) hypertension: Secondary | ICD-10-CM | POA: Diagnosis not present

## 2019-11-04 DIAGNOSIS — I776 Arteritis, unspecified: Secondary | ICD-10-CM | POA: Diagnosis present

## 2019-11-04 DIAGNOSIS — K7581 Nonalcoholic steatohepatitis (NASH): Secondary | ICD-10-CM | POA: Diagnosis present

## 2019-11-04 DIAGNOSIS — Z515 Encounter for palliative care: Secondary | ICD-10-CM | POA: Diagnosis present

## 2019-11-04 DIAGNOSIS — Z89611 Acquired absence of right leg above knee: Secondary | ICD-10-CM

## 2019-11-04 DIAGNOSIS — L0390 Cellulitis, unspecified: Secondary | ICD-10-CM | POA: Diagnosis present

## 2019-11-04 DIAGNOSIS — T8781 Dehiscence of amputation stump: Secondary | ICD-10-CM

## 2019-11-04 DIAGNOSIS — Z20822 Contact with and (suspected) exposure to covid-19: Secondary | ICD-10-CM | POA: Diagnosis present

## 2019-11-04 DIAGNOSIS — F1410 Cocaine abuse, uncomplicated: Secondary | ICD-10-CM | POA: Diagnosis present

## 2019-11-04 DIAGNOSIS — T8789 Other complications of amputation stump: Secondary | ICD-10-CM | POA: Diagnosis not present

## 2019-11-04 DIAGNOSIS — Y835 Amputation of limb(s) as the cause of abnormal reaction of the patient, or of later complication, without mention of misadventure at the time of the procedure: Secondary | ICD-10-CM | POA: Diagnosis present

## 2019-11-04 DIAGNOSIS — Z66 Do not resuscitate: Secondary | ICD-10-CM | POA: Diagnosis present

## 2019-11-04 DIAGNOSIS — T8744 Infection of amputation stump, left lower extremity: Secondary | ICD-10-CM | POA: Diagnosis present

## 2019-11-04 DIAGNOSIS — R6520 Severe sepsis without septic shock: Secondary | ICD-10-CM | POA: Diagnosis not present

## 2019-11-04 DIAGNOSIS — F329 Major depressive disorder, single episode, unspecified: Secondary | ICD-10-CM | POA: Diagnosis present

## 2019-11-04 DIAGNOSIS — E440 Moderate protein-calorie malnutrition: Secondary | ICD-10-CM | POA: Diagnosis not present

## 2019-11-04 DIAGNOSIS — I999 Unspecified disorder of circulatory system: Secondary | ICD-10-CM | POA: Diagnosis not present

## 2019-11-04 DIAGNOSIS — E876 Hypokalemia: Secondary | ICD-10-CM | POA: Diagnosis not present

## 2019-11-04 DIAGNOSIS — L03115 Cellulitis of right lower limb: Secondary | ICD-10-CM | POA: Diagnosis present

## 2019-11-04 DIAGNOSIS — I110 Hypertensive heart disease with heart failure: Secondary | ICD-10-CM | POA: Diagnosis present

## 2019-11-04 DIAGNOSIS — L97111 Non-pressure chronic ulcer of right thigh limited to breakdown of skin: Secondary | ICD-10-CM | POA: Diagnosis present

## 2019-11-04 DIAGNOSIS — E8809 Other disorders of plasma-protein metabolism, not elsewhere classified: Secondary | ICD-10-CM | POA: Diagnosis not present

## 2019-11-04 DIAGNOSIS — G546 Phantom limb syndrome with pain: Secondary | ICD-10-CM | POA: Diagnosis not present

## 2019-11-04 DIAGNOSIS — L089 Local infection of the skin and subcutaneous tissue, unspecified: Secondary | ICD-10-CM

## 2019-11-04 DIAGNOSIS — F1420 Cocaine dependence, uncomplicated: Secondary | ICD-10-CM | POA: Diagnosis not present

## 2019-11-04 DIAGNOSIS — M069 Rheumatoid arthritis, unspecified: Secondary | ICD-10-CM | POA: Diagnosis present

## 2019-11-04 DIAGNOSIS — T8743 Infection of amputation stump, right lower extremity: Secondary | ICD-10-CM | POA: Diagnosis present

## 2019-11-04 DIAGNOSIS — G43909 Migraine, unspecified, not intractable, without status migrainosus: Secondary | ICD-10-CM | POA: Diagnosis present

## 2019-11-04 DIAGNOSIS — D631 Anemia in chronic kidney disease: Secondary | ICD-10-CM | POA: Diagnosis present

## 2019-11-04 DIAGNOSIS — N179 Acute kidney failure, unspecified: Secondary | ICD-10-CM | POA: Diagnosis not present

## 2019-11-04 DIAGNOSIS — Z89022 Acquired absence of left finger(s): Secondary | ICD-10-CM

## 2019-11-04 DIAGNOSIS — Z79899 Other long term (current) drug therapy: Secondary | ICD-10-CM

## 2019-11-04 DIAGNOSIS — D72829 Elevated white blood cell count, unspecified: Secondary | ICD-10-CM | POA: Diagnosis not present

## 2019-11-04 DIAGNOSIS — F17210 Nicotine dependence, cigarettes, uncomplicated: Secondary | ICD-10-CM | POA: Diagnosis present

## 2019-11-04 DIAGNOSIS — D638 Anemia in other chronic diseases classified elsewhere: Secondary | ICD-10-CM | POA: Diagnosis not present

## 2019-11-04 DIAGNOSIS — F14988 Cocaine use, unspecified with other cocaine-induced disorder: Secondary | ICD-10-CM | POA: Diagnosis not present

## 2019-11-04 DIAGNOSIS — A419 Sepsis, unspecified organism: Secondary | ICD-10-CM | POA: Diagnosis present

## 2019-11-04 DIAGNOSIS — Z888 Allergy status to other drugs, medicaments and biological substances status: Secondary | ICD-10-CM

## 2019-11-04 DIAGNOSIS — E46 Unspecified protein-calorie malnutrition: Secondary | ICD-10-CM | POA: Diagnosis not present

## 2019-11-04 LAB — CBC WITH DIFFERENTIAL/PLATELET
Abs Immature Granulocytes: 0.04 10*3/uL (ref 0.00–0.07)
Basophils Absolute: 0 10*3/uL (ref 0.0–0.1)
Basophils Relative: 0 %
Eosinophils Absolute: 0 10*3/uL (ref 0.0–0.5)
Eosinophils Relative: 0 %
HCT: 35.2 % — ABNORMAL LOW (ref 36.0–46.0)
Hemoglobin: 10.7 g/dL — ABNORMAL LOW (ref 12.0–15.0)
Immature Granulocytes: 1 %
Lymphocytes Relative: 26 %
Lymphs Abs: 2.2 10*3/uL (ref 0.7–4.0)
MCH: 25.7 pg — ABNORMAL LOW (ref 26.0–34.0)
MCHC: 30.4 g/dL (ref 30.0–36.0)
MCV: 84.6 fL (ref 80.0–100.0)
Monocytes Absolute: 0.6 10*3/uL (ref 0.1–1.0)
Monocytes Relative: 8 %
Neutro Abs: 5.4 10*3/uL (ref 1.7–7.7)
Neutrophils Relative %: 65 %
Platelets: 191 10*3/uL (ref 150–400)
RBC: 4.16 MIL/uL (ref 3.87–5.11)
RDW: 18.4 % — ABNORMAL HIGH (ref 11.5–15.5)
WBC: 8.2 10*3/uL (ref 4.0–10.5)
nRBC: 0 % (ref 0.0–0.2)

## 2019-11-04 LAB — BASIC METABOLIC PANEL
Anion gap: 13 (ref 5–15)
BUN: 32 mg/dL — ABNORMAL HIGH (ref 6–20)
CO2: 18 mmol/L — ABNORMAL LOW (ref 22–32)
Calcium: 8.7 mg/dL — ABNORMAL LOW (ref 8.9–10.3)
Chloride: 101 mmol/L (ref 98–111)
Creatinine, Ser: 0.94 mg/dL (ref 0.44–1.00)
GFR calc Af Amer: >60 mL/min (ref >60)
GFR calc non Af Amer: >60 mL/min (ref >60)
Glucose, Bld: 98 mg/dL (ref 70–99)
Potassium: 4.7 mmol/L (ref 3.5–5.1)
Sodium: 132 mmol/L — ABNORMAL LOW (ref 135–145)

## 2019-11-04 LAB — SARS CORONAVIRUS 2 (TAT 6-24 HRS): SARS Coronavirus 2: NEGATIVE

## 2019-11-04 LAB — LACTIC ACID, PLASMA
Lactic Acid, Venous: 3.1 mmol/L (ref 0.5–1.9)
Lactic Acid, Venous: 3.6 mmol/L (ref 0.5–1.9)

## 2019-11-04 MED ORDER — PIPERACILLIN-TAZOBACTAM 3.375 G IVPB
3.3750 g | Freq: Three times a day (TID) | INTRAVENOUS | Status: DC
  Administered 2019-11-04 – 2019-11-09 (×14): 3.375 g via INTRAVENOUS
  Filled 2019-11-04 (×15): qty 50

## 2019-11-04 MED ORDER — ACETAMINOPHEN 325 MG PO TABS: 650.0000 mg | ORAL_TABLET | Freq: Four times a day (QID) | ORAL | Status: DC | PRN

## 2019-11-04 MED ORDER — LOSARTAN POTASSIUM 25 MG PO TABS
25.0000 mg | ORAL_TABLET | Freq: Every day | ORAL | Status: DC
  Administered 2019-11-04 – 2019-11-10 (×6): 25 mg via ORAL
  Filled 2019-11-04 (×7): qty 1

## 2019-11-04 MED ORDER — SODIUM CHLORIDE 0.9 % IV BOLUS
1000.0000 mL | Freq: Once | INTRAVENOUS | Status: AC
  Administered 2019-11-04: 1000 mL via INTRAVENOUS

## 2019-11-04 MED ORDER — POLYETHYLENE GLYCOL 3350 17 G PO PACK
17.0000 g | PACK | Freq: Every day | ORAL | Status: DC
  Administered 2019-11-04: 17 g via ORAL
  Filled 2019-11-04 (×6): qty 1

## 2019-11-04 MED ORDER — VANCOMYCIN HCL 750 MG/150ML IV SOLN
750.0000 mg | INTRAVENOUS | Status: DC
  Administered 2019-11-05: 750 mg via INTRAVENOUS
  Filled 2019-11-04 (×2): qty 150

## 2019-11-04 MED ORDER — ENOXAPARIN SODIUM 40 MG/0.4ML ~~LOC~~ SOLN
40.0000 mg | SUBCUTANEOUS | Status: DC
  Administered 2019-11-04 – 2019-11-06 (×3): 40 mg via SUBCUTANEOUS
  Filled 2019-11-04 (×3): qty 0.4

## 2019-11-04 MED ORDER — MORPHINE SULFATE (PF) 2 MG/ML IV SOLN
2.0000 mg | INTRAVENOUS | Status: DC | PRN
  Administered 2019-11-05 – 2019-11-08 (×5): 2 mg via INTRAVENOUS
  Filled 2019-11-04 (×5): qty 1

## 2019-11-04 MED ORDER — AMLODIPINE BESYLATE 10 MG PO TABS
10.0000 mg | ORAL_TABLET | Freq: Every day | ORAL | Status: DC
  Administered 2019-11-04 – 2019-11-10 (×6): 10 mg via ORAL
  Filled 2019-11-04: qty 2
  Filled 2019-11-04 (×2): qty 1
  Filled 2019-11-04 (×3): qty 2

## 2019-11-04 MED ORDER — SODIUM CHLORIDE 0.9 % IV SOLN: INTRAVENOUS | Status: DC

## 2019-11-04 MED ORDER — VANCOMYCIN HCL IN DEXTROSE 1-5 GM/200ML-% IV SOLN
1000.0000 mg | Freq: Once | INTRAVENOUS | Status: AC
  Administered 2019-11-04: 1000 mg via INTRAVENOUS
  Filled 2019-11-04: qty 200

## 2019-11-04 MED ORDER — HYDROCODONE-ACETAMINOPHEN 5-325 MG PO TABS
1.0000 | ORAL_TABLET | Freq: Four times a day (QID) | ORAL | Status: DC | PRN
  Administered 2019-11-05 – 2019-11-10 (×8): 1 via ORAL
  Filled 2019-11-04 (×8): qty 1

## 2019-11-04 MED ORDER — PIPERACILLIN-TAZOBACTAM 3.375 G IVPB 30 MIN
3.3750 g | Freq: Once | INTRAVENOUS | Status: AC
  Administered 2019-11-04: 3.375 g via INTRAVENOUS
  Filled 2019-11-04: qty 50

## 2019-11-04 MED ORDER — LABETALOL HCL 5 MG/ML IV SOLN
10.0000 mg | INTRAVENOUS | Status: DC | PRN
  Filled 2019-11-04: qty 4

## 2019-11-04 MED ORDER — SODIUM CHLORIDE 0.9 % IV SOLN
2.0000 g | Freq: Once | INTRAVENOUS | Status: AC
  Administered 2019-11-04: 2 g via INTRAVENOUS
  Filled 2019-11-04: qty 2

## 2019-11-04 MED ORDER — ONDANSETRON HCL 4 MG/2ML IJ SOLN
4.0000 mg | Freq: Three times a day (TID) | INTRAMUSCULAR | Status: DC | PRN
  Administered 2019-11-04 – 2019-11-08 (×2): 4 mg via INTRAVENOUS
  Filled 2019-11-04: qty 2

## 2019-11-04 MED ORDER — HYDROMORPHONE HCL 1 MG/ML IJ SOLN
1.0000 mg | Freq: Once | INTRAMUSCULAR | Status: AC
  Administered 2019-11-04: 1 mg via INTRAMUSCULAR
  Filled 2019-11-04: qty 1

## 2019-11-04 NOTE — H&P (Signed)
History and Physical    [name]:[id number] DOB: 01-14-1961 DOA: 11/04/2019  PCP: Azzie Glatter, FNP   Patient coming from: [place]    Chief Complaint: Pain in the right AKA stump.  HPI: [name] is a 59 y.o. female with medical history significant of bilateral AKA, cocaine induced vasculopathy, inflammatory arthritis, hypertension not taking any medication at home presented to the emergency department with complaints of severe pain, discharge from her right AKA stump.  She had presented on 10/31/2019 here in the emergency department with similar complaint and she was discharged on oral doxycycline which did not help.  Patient has history of chronic pain on bilateral stump but this time this time pain was worse on the right side.  Patient has also noticed numerous eruption on her skin and some of them look like infected.  She has history of levamisole-induced vasculitis.  She has long history of cocaine abuse.  She was admitted with similar complaint on 09/29/2019 with suspicion of sepsis but her blood cultures were negative.  She has poor wound healing with chronic  skin changes which are suspected secondary to cocaine use and levamisole-induced vasculitis.  She had bilateral AKA by Dr. Sharol Given for nonhealing wounds secondary to peripheral vascular disease.  She has been admitted several times for cellulitis of the stump. Patient seen and examined at the bedside in the emergency department.  She was hypertensive during my evaluation.  She was complaining of pain on her right AKA stump.  Denies any fever, chills, chest pain, shortness of breath, abdominal pain, nausea, diarrhea or vomiting.  ED Course: Afebrile.  Found to have lactic acid of 3.1.  No leukocytosis.  Drainage was noted from the right AKA stump.  X-ray did not show osteomyelitis.  MRI pending.  Sepsis suspected, started on broad spectrum antibiotics and fluids.  Review of Systems: As per HPI otherwise 10 point review  of systems negative.    Past Medical History:  Diagnosis Date  . CAP (community acquired pneumonia) 03/2014 X 2  . Cocaine abuse ([place])    ongoing with resultant vaculitis.  . Depression   . Headache    "weekly" (07/29/2016)  . Hypertension   . Inflammatory arthritis   . Migraines    "probably 5-6/yr" (07/29/2016)  . Normocytic anemia    BL Hgb 9.8-12. Last anemia panel 04/2010 - showing Fe 19, ferritin 101.  Pt on monthly B12 injections  . Rheumatoid arthritis(714.0)    patient reported  . S/P bilateral below knee amputation ([place]) 04/13/2017  . 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 Followed by Dr. Louanne Skye    Past Surgical History:  Procedure Laterality Date  . AMPUTATION Left 05/22/2016   Procedure: AMPUTATION LEFT LONG FINGER;  Surgeon: Marybelle Killings, MD;  Location: [facility];  Service: Orthopedics;  Laterality: Left;  . AMPUTATION Bilateral 04/10/2017   Procedure: AMPUTATION BELOW KNEE;  Surgeon: Newt Minion, MD;  Location: [facility];  Service: Orthopedics;  Laterality: Bilateral;  . AMPUTATION Bilateral 02/06/2018   Procedure: AMPUTATION ABOVE KNEE;  Surgeon: Newt Minion, MD;  Location: [facility];  Service: Orthopedics;  Laterality: Bilateral;  . HERNIA REPAIR     "stomach"  . I & D EXTREMITY Right 09/26/2015   Procedure: IRRIGATION AND DEBRIDEMENT LEG WOUND  VAC PLACEMENT.;  Surgeon: Loel Lofty Dillingham, DO;  Location: [facility];  Service: Plastics;  Laterality: Right;  . INCISION AND  DRAINAGE OF WOUND Bilateral 10/20/2016   Procedure: IRRIGATION AND DEBRIDEMENT WOUND BILATERAL;  Surgeon: Edrick Kins, DPM;  Location: [facility];  Service: Podiatry;  Laterality: Bilateral;  . IRRIGATION AND DEBRIDEMENT ABSCESS Bilateral 09/26/2013   Procedure: DEBRIDEMENT ULCERS BILATERAL THIGHS;  Surgeon: Gwenyth Ober, MD;  Location: [facility];  Service: General;  Laterality: Bilateral;  . SKIN BIOPSY Bilateral    shin nodules     reports that she has  been smoking cigarettes. She has a 4.56 pack-year smoking history. She has never used smokeless tobacco. She reports current drug use. Drugs: "Crack" cocaine and Cocaine. She reports that she does not drink alcohol.  Allergies  Allergen Reactions  . Levamisole     Levamisole is a chemical adulterant of cocaine which can cause cutaneous necrosis It is used as an Health and safety inspector in veterinary medicine  . Acetaminophen Swelling and Other (See Comments)    Reaction:  Eyelid swelling  . Haldol [Haloperidol Lactate]     Possible eye swelling was given together with Toradol unsure if caused the reaction  . Lisinopril     UNSPECIFIED REACTION   . Toradol [Ketorolac Tromethamine]     Eye swelling  . Tramadol Rash    Family History  Problem Relation Age of Onset  . Breast cancer Mother        Breast cancer  . Alcohol abuse Mother   . Colon cancer Maternal Aunt 50  . Alcohol abuse Father      Prior to Admission medications   Medication Sig Start Date End Date Taking? Authorizing Provider  doxycycline (VIBRAMYCIN) 100 MG capsule Take 1 capsule (100 mg total) by mouth 2 (two) times daily for 7 days. 10/31/19 11/07/19 Yes Khatri, Hina, PA-C  lidocaine (XYLOCAINE) 5 % ointment Apply 1 application topically as needed. 10/31/19  Yes Khatri, Hina, PA-C  amLODipine (NORVASC) 10 MG tablet Take 1 tablet (10 mg total) by mouth daily. Patient not taking: Reported on 11/04/2019 08/25/19   Azzie Glatter, FNP  nystatin cream (MYCOSTATIN) Apply to affected area 2 times daily Patient not taking: Reported on 11/04/2019 09/18/19   Lacretia Leigh, MD  oxyCODONE (OXY IR/ROXICODONE) 5 MG immediate release tablet Take 1 tablet (5 mg total) by mouth every 6 (six) hours as needed for moderate pain. Patient not taking: Reported on 11/04/2019 10/13/19   Hayden Rasmussen, MD  polyethylene glycol (MIRALAX / GLYCOLAX) 17 g packet Take 17 g by mouth daily. Patient not taking: Reported on 11/04/2019 09/30/19   Jeanmarie Hubert, MD    Physical Exam: Vitals:   11/04/19 1330 11/04/19 1500 11/04/19 1604 11/04/19 1630  BP:  (!) 172/96 (!) 154/119 (!) 157/105  Pulse:  96 93 92  Resp:  18 16 15   Temp:      TempSrc:      SpO2: 100% 100% 97% 97%    Constitutional: Extremely deconditioned, debilitated, chronically ill looking Vitals:   11/04/19 1330 11/04/19 1500 11/04/19 1604 11/04/19 1630  BP:  (!) 172/96 (!) 154/119 (!) 157/105  Pulse:  96 93 92  Resp:  18 16 15   Temp:      TempSrc:      SpO2: 100% 100% 97% 97%   Eyes: PERRL, lids are swollen which is a chronic problem for her ENMT: Mucous membranes are moist.   Neck: normal, supple, no masses, no thyromegaly Respiratory: clear to auscultation bilaterally, no wheezing, no crackles. Normal respiratory effort. No accessory muscle use.  Cardiovascular: Regular rate and rhythm,  no murmurs / rubs / gallops. No extremity edema.  Abdomen: no tenderness, no masses palpated. No hepatosplenomegaly. Bowel sounds positive.  Musculoskeletal: Bilateral AKA Skin: Drainage from right AKA stump, numerous skin eruptions, old scabs, small scattered abscess-like lesions, accelerations Neurologic: CN 2-12 grossly intact.Strength 5/5 in all 4.  Psychiatric: Irritability  Foley Catheter:None       Labs on Admission: I have personally reviewed following labs and imaging studies  CBC: Recent Labs  Lab 11/04/19 1407  WBC 8.2  NEUTROABS 5.4  HGB 10.7*  HCT 35.2*  MCV 84.6  PLT 342   Basic Metabolic Panel: Recent Labs  Lab 11/04/19 1407  NA 132*  K 4.7  CL 101  CO2 18*  GLUCOSE 98  BUN 32*  CREATININE 0.94  CALCIUM 8.7*   GFR: CrCl cannot be calculated (Unknown ideal weight.). Liver Function Tests: No results for input(s): AST, ALT, ALKPHOS, BILITOT, PROT, ALBUMIN in the last 168 hours. No results for input(s): LIPASE, AMYLASE in the last 168 hours. No results for input(s): AMMONIA in the last 168 hours. Coagulation Profile: No results for  input(s): INR, PROTIME in the last 168 hours. Cardiac Enzymes: No results for input(s): CKTOTAL, CKMB, CKMBINDEX, TROPONINI in the last 168 hours. BNP (last 3 results) No results for input(s): PROBNP in the last 8760 hours. HbA1C: No results for input(s): HGBA1C in the last 72 hours. CBG: No results for input(s): GLUCAP in the last 168 hours. Lipid Profile: No results for input(s): CHOL, HDL, LDLCALC, TRIG, CHOLHDL, LDLDIRECT in the last 72 hours. Thyroid Function Tests: No results for input(s): TSH, T4TOTAL, FREET4, T3FREE, THYROIDAB in the last 72 hours. Anemia Panel: No results for input(s): VITAMINB12, FOLATE, FERRITIN, TIBC, IRON, RETICCTPCT in the last 72 hours. Urine analysis:    Component Value Date/Time   COLORURINE AMBER (A) 09/26/2019 1338   APPEARANCEUR HAZY (A) 09/26/2019 1338   LABSPEC 1.021 09/26/2019 1338   PHURINE 5.0 09/26/2019 1338   GLUCOSEU NEGATIVE 09/26/2019 1338   HGBUR MODERATE (A) 09/26/2019 1338   BILIRUBINUR NEGATIVE 09/26/2019 1338   BILIRUBINUR small 04/29/2015 1533   KETONESUR NEGATIVE 09/26/2019 1338   PROTEINUR 100 (A) 09/26/2019 1338   UROBILINOGEN 1.0 07/26/2017 1550   NITRITE NEGATIVE 09/26/2019 1338   LEUKOCYTESUR SMALL (A) 09/26/2019 1338    Radiological Exams on Admission: DG Femur Min 2 Views Right  Result Date: 11/04/2019 CLINICAL DATA:  Pain at the level of above knee amputation stump. EXAM: RIGHT FEMUR 2 VIEWS COMPARISON:  08/13/2018 FINDINGS: Stable appearance of residual right femur with no evidence of fracture or bony destruction. Stable benign-appearing sclerotic region in the femoral neck. Surrounding soft tissues appear unremarkable. No soft tissue foreign body identified. IMPRESSION: No evidence of fracture or radiographic evidence to suggest osteomyelitis. Electronically Signed   By: Aletta Edouard M.D.   On: 11/04/2019 15:03     Assessment/Plan Principal Problem:   Sepsis ([place]) Active Problems:   Essential hypertension    Protein-calorie malnutrition, severe (HCC)   Malnutrition of moderate degree   Ulcer of right thigh, limited to breakdown of skin ([facility]CC)   S/P bilateral above knee amputation ([facility])   Cellulitis   Stump pain ([facility])   Sepsis: Possible infection of the right AKA stump.  There is some drainage, patient presented with lactic acidosis.  Started on broad-spectrum antibiotics.   Follow-up cultures.  Blood pressure currently stable.  Right AKA stump infection/cellulitis:X-ray did not show any osteomyelitis.  Pending MRI.  I have requested Dr. Sharol Given for consultation,  he will see her tomorrow.  Wound care consulted  Hypertension: Looks uncontrolled.  Currently not taking any medications at home.  Will resume her amlodipine.  Added losartan.  Continue as needed meds  Severe protein calorie malnutrition: Dietitian consulted  Debility/deconditioning: Bilateral amputee.  Ambulates with the help of wheelchair.  Resides in boarding house  Skin eruption: Scattered new and old macular lesions, some look infected and filled with small abscesses.  Antibiotics should help.  Her chronic skin changes have been attributed to cocaine abuse causing vasculitis.  History of polysubstance abuse/cocaine abuse: Counseled cessation.  Will check UDS to rule out current use.    Severity of Illness: The appropriate patient status for this patient is INPATIENT. Inpatient status is judged to be reasonable and necessary in order to provide the required intensity of service to ensure the patient's safety. The patient's presenting symptoms, physical exam findings, and initial radiographic and laboratory data in the context of their chronic comorbidities is felt to place them at high risk for further clinical deterioration. Furthermore, it is not anticipated that the patient will be medically stable for discharge from the hospital within 2 midnights of admission. The following factors support the patient status of inpatient.   " The  patient's presenting symptoms include pain, drainage from right AKA stump " The worrisome physical exam findings include infection of right AKA stump. " The initial radiographic and laboratory data are worrisome because of lactic acidosis " The chronic co-morbidities include debility/deconditioning, hypertension, bilateral AKA.   * I certify that at the point of admission it is my clinical judgment that the patient will require inpatient hospital care spanning beyond 2 midnights from the point of admission due to high intensity of service, high risk for further deterioration and high frequency of surveillance required.*    DVT prophylaxis: Lovenox Code Status: Full Family Communication: None present at the bedside Consults called: Orthopedics    Shelly Coss MD [facility] Pager [phone]  If 7PM-7AM, please contact night-coverage [email or web address] Password [email or web address]  11/04/2019, 5:06 PM

## 2019-11-04 NOTE — ED Provider Notes (Signed)
[facility] DEPT Provider Note   CSN: [id number] Arrival date & time: 11/04/19  1309     History Chief Complaint  Patient presents with  . Leg Pain  . Wound Check    [name] is a 59 y.o. female.  [name] is a 59 y.o. female with bilateral above-knee amputations, cocaine abuse, hypertension, vasculitis, rheumatoid arthritis, who presents to the emergency department via EMS from home for evaluation of pain over bilateral amputation stumps with painful sores over both stumps.  She states these have been worsening and she has noted some drainage from the right stump.  She states she was seen for similar 4 days ago and was prescribed doxycycline and lidocaine gel which she has been using without improvement.  Despite receiving 150 mcg of intranasal fentanyl with EMS patient is still reporting severe pain in bilateral stumps, worse on the right.  She has had some chills but has not taken her temperature, has not had any nausea or vomiting.  Has not had any cough, chest pain or shortness of breath.  States that she has had a chronic wounds and is frequently seen and placed on antibiotics but pain is worse than usual.  Her amputations were done by Dr. Sharol Given, but she has not followed up with him regarding this infection.        Past Medical History:  Diagnosis Date  . CAP (community acquired pneumonia) 03/2014 X 2  . Cocaine abuse ([place])    ongoing with resultant vaculitis.  . Depression   . Headache    "weekly" (07/29/2016)  . Hypertension   . Inflammatory arthritis   . Migraines    "probably 5-6/yr" (07/29/2016)  . Normocytic anemia    BL Hgb 9.8-12. Last anemia panel 04/2010 - showing Fe 19, ferritin 101.  Pt on monthly B12 injections  . Rheumatoid arthritis(714.0)    patient reported  . S/P bilateral below knee amputation ([place]) 04/13/2017  . 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 Followed  by Dr. Louanne Skye    Patient Active Problem List   Diagnosis Date Noted  . Stump pain ([place]) 09/27/2019  . Anemia   . Cellulitis 09/26/2019  . Hyperglycemia 08/27/2019  . Disability of walking 05/28/2019  . At risk for adverse drug event 08/30/2018  . Microcytic hypochromic anemia 08/30/2018  . Thrombocytopenia ([place]) 08/20/2018  . Toxic encephalopathy 08/20/2018  . Goals of care, counseling/discussion   . Palliative care by specialist   . DNR (do not resuscitate) discussion   . Infiltrate noted on imaging study   . Ulcer of right thigh, limited to breakdown of skin ([place])   . S/P bilateral above knee amputation ([place])   . Homelessness 07/30/2018  . Elevated LFTs 07/30/2018  . Osteomyelitis ([place]) 07/30/2018  . Acute encephalopathy 07/30/2018  . Eye swelling, bilateral 07/30/2018  . Skin rash 07/11/2018  . Acute on chronic diastolic CHF (congestive heart failure) ([place]) 06/21/2018  . Malnutrition of moderate degree 05/20/2018  . Acute congestive heart failure ([place])   . Suicidal ideation 02/02/2018  . HCAP (healthcare-associated pneumonia) 02/02/2018  . Acute on chronic respiratory failure with hypoxia ([place]) 02/02/2018  . Sepsis ([place]) 02/02/2018  . Ulcer of amputation stump of lower extremity ([place]) 10/23/2017  . Cocaine abuse with cocaine-induced mood disorder ([place]) 08/10/2017  . Hypertensive crisis   . Phantom limb pain ([place])   . Tobacco abuse   . Amputation stump  pain ([place])   . Acute blood loss anemia   . 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 AKI (acute kidney injury) ([place]) 02/07/2017  . Acute kidney injury ([place]) 02/06/2017  . Wound healing, delayed   . Chest pain 04/07/2015  . Protein-calorie malnutrition, severe ([place]) 01/15/2015  . Cocaine abuse ([place])   . MDD (major depressive disorder), recurrent episode, severe ([place]) 10/16/2014  . Cocaine use disorder, severe, dependence ([place]) 10/10/2014  . 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 Essential hypertension 02/26/2010    Past Surgical History:  Procedure Laterality Date  . AMPUTATION Left 05/22/2016   Procedure: AMPUTATION LEFT LONG FINGER;  Surgeon: Marybelle Killings, MD;  Location: [facility];  Service: Orthopedics;  Laterality: Left;  . AMPUTATION Bilateral 04/10/2017   Procedure: AMPUTATION BELOW KNEE;  Surgeon: Newt Minion, MD;  Location: [facility];  Service: Orthopedics;  Laterality: Bilateral;  . AMPUTATION Bilateral 02/06/2018   Procedure: AMPUTATION ABOVE KNEE;  Surgeon: Newt Minion, MD;  Location: [facility];  Service: Orthopedics;  Laterality: Bilateral;  . HERNIA REPAIR     "stomach"  . I & D EXTREMITY Right 09/26/2015   Procedure: IRRIGATION AND DEBRIDEMENT LEG WOUND  VAC PLACEMENT.;  Surgeon: Loel Lofty Dillingham, DO;  Location: [facility];  Service: Plastics;  Laterality: Right;  . INCISION AND DRAINAGE OF WOUND Bilateral 10/20/2016   Procedure: IRRIGATION AND DEBRIDEMENT WOUND BILATERAL;  Surgeon: Edrick Kins, DPM;  Location: [facility];  Service: Podiatry;  Laterality: Bilateral;  . IRRIGATION AND DEBRIDEMENT ABSCESS Bilateral 09/26/2013   Procedure: DEBRIDEMENT ULCERS BILATERAL THIGHS;  Surgeon: Gwenyth Ober, MD;  Location: [facility];  Service: General;  Laterality: Bilateral;  . SKIN BIOPSY Bilateral    shin nodules     OB History    Gravida  0   Para  0   Term  0   Preterm  0   AB  0   Living  0     SAB  0   TAB  0   Ectopic  0   Multiple  0   Live Births  0           Family History  Problem Relation Age of Onset  . Breast cancer Mother        Breast cancer  . Alcohol abuse Mother   . Colon cancer Maternal Aunt 69  . Alcohol abuse Father     Social History   Tobacco Use  . Smoking status: Current Every Day Smoker    Packs/day: 0.12    Years: 38.00    Pack years: 4.56    Types: Cigarettes  . Smokeless tobacco: Never Used  . Tobacco comment: 2 A DAY  Substance Use Topics  . Alcohol use: No    Alcohol/week:  0.0 standard drinks  . Drug use: Yes    Types: "Crack" cocaine, Cocaine    Comment: 08/19/2019    Home Medications Prior to Admission medications   Medication Sig Start Date End Date Taking? Authorizing Provider  doxycycline (VIBRAMYCIN) 100 MG capsule Take 1 capsule (100 mg total) by mouth 2 (two) times daily for 7 days. 10/31/19 11/07/19 Yes Khatri, Hina, PA-C  lidocaine (XYLOCAINE) 5 % ointment Apply 1 application topically as needed. 10/31/19  Yes Khatri, Hina, PA-C  amLODipine (NORVASC) 10 MG tablet Take 1 tablet (10 mg total) by mouth daily. Patient not taking: Reported on 11/04/2019 08/25/19   Azzie Glatter, FNP  nystatin cream (MYCOSTATIN) Apply to affected area 2 times daily Patient not taking: Reported on 11/04/2019 09/18/19   Lacretia Leigh, MD  oxyCODONE (OXY IR/ROXICODONE) 5 MG immediate release tablet Take 1 tablet (5 mg total) by mouth every 6 (six) hours as needed for moderate pain. Patient not taking: Reported on 11/04/2019 10/13/19   Hayden Rasmussen, MD  polyethylene glycol (MIRALAX / GLYCOLAX) 17 g packet Take 17 g by mouth daily. Patient not taking: Reported on 11/04/2019 09/30/19   Jeanmarie Hubert, MD    Allergies    Levamisole, Acetaminophen, Haldol [haloperidol lactate], Lisinopril, Toradol [ketorolac tromethamine], and Tramadol  Review of Systems   Review of Systems  Constitutional: Positive for chills. Negative for fever.  HENT: Negative.   Respiratory: Negative for cough and shortness of breath.   Cardiovascular: Negative for chest pain.  Gastrointestinal: Negative for abdominal pain, nausea and vomiting.  Genitourinary: Negative for dysuria and frequency.  Musculoskeletal: Positive for myalgias.  Skin: Positive for wound.  All other systems reviewed and are negative.   Physical Exam Updated Vital Signs BP (!) 167/123   Pulse 98   Temp 97.9 F (36.6 C) (Oral)   Resp 20   SpO2 100%   Physical Exam Vitals and nursing note reviewed.  Constitutional:       General: She is not in acute distress.    Appearance: She is well-developed. She is not diaphoretic.     Comments: Patient is tearful and appears in pain  HENT:     Head: Normocephalic and atraumatic.  Eyes:     General:        Right eye: No discharge.        Left eye: No discharge.  Cardiovascular:     Rate and Rhythm: Normal rate and regular rhythm.     Heart sounds: Normal heart sounds. No murmur. No friction rub. No gallop.   Pulmonary:     Effort: Pulmonary effort is normal. No respiratory distress.     Breath sounds: Normal breath sounds. No wheezing or rales.     Comments: Respirations equal and unlabored, patient able to speak in full sentences, lungs clear to auscultation bilaterally Abdominal:     General: Bowel sounds are normal. There is no distension.     Palpations: Abdomen is soft. There is no mass.     Tenderness: There is no abdominal tenderness. There is no guarding.     Comments: Abdomen soft, nondistended, nontender to palpation in all quadrants without guarding or peritoneal signs  Musculoskeletal:        General: No deformity.     Cervical back: Neck supple.     Comments: Bilateral amputation stumps with scabbed wounds, on the right there is an open area that is draining as pictured below.  There is no palpable fluctuance or fluid collection but they are both very tender to palpation.  No palpable crepitus.  Skin:    General: Skin is warm and dry.     Capillary Refill: Capillary refill takes less than 2 seconds.     Comments: Numerous pockmarks over the skin  Neurological:     Mental Status: She is alert.     Coordination: Coordination normal.     Comments: Speech is clear, able to follow commands  Psychiatric:        Mood and Affect: Mood normal.        Behavior: Behavior normal.       LEFT  RIGHT  ED Results / Procedures / Treatments   Labs (all labs ordered are  listed, but only abnormal results are displayed) Labs Reviewed  CBC WITH DIFFERENTIAL/PLATELET - Abnormal; Notable for the following components:      Result Value   Hemoglobin 10.7 (*)    HCT 35.2 (*)    MCH 25.7 (*)    RDW 18.4 (*)    All other components within normal limits  BASIC METABOLIC PANEL - Abnormal; Notable for the following components:   Sodium 132 (*)    CO2 18 (*)    BUN 32 (*)    Calcium 8.7 (*)    All other components within normal limits  LACTIC ACID, PLASMA - Abnormal; Notable for the following components:   Lactic Acid, Venous 3.1 (*)    All other components within normal limits  LACTIC ACID, PLASMA - Abnormal; Notable for the following components:   Lactic Acid, Venous 3.6 (*)    All other components within normal limits  CULTURE, BLOOD (ROUTINE X 2)  CULTURE, BLOOD (ROUTINE X 2)  SARS CORONAVIRUS 2 (TAT 6-24 HRS)  LACTIC ACID, PLASMA  BASIC METABOLIC PANEL  CBC  RAPID URINE DRUG SCREEN, HOSP PERFORMED    EKG None  Radiology DG Femur Min 2 Views Right  Result Date: 11/04/2019 CLINICAL DATA:  Pain at the level of above knee amputation stump. EXAM: RIGHT FEMUR 2 VIEWS COMPARISON:  08/13/2018 FINDINGS: Stable appearance of residual right femur with no evidence of fracture or bony destruction. Stable benign-appearing sclerotic region in the femoral neck. Surrounding soft tissues appear unremarkable. No soft tissue foreign body identified. IMPRESSION: No evidence of fracture or radiographic evidence to suggest osteomyelitis. Electronically Signed   By: Aletta Edouard M.D.   On: 11/04/2019 15:03    Procedures Procedures (including critical care time)  Medications Ordered in ED Medications  vancomycin (VANCOCIN) IVPB 1000 mg/200 mL premix (1,000 mg Intravenous New Bag/Given 11/04/19 1646)  0.9 %  sodium chloride infusion (has no administration in time range)  morphine 2 MG/ML injection 2 mg (has no administration in time range)  acetaminophen (TYLENOL)  tablet 650 mg (has no administration in time range)  HYDROcodone-acetaminophen (NORCO/VICODIN) 5-325 MG per tablet 1 tablet (has no administration in time range)  amLODipine (NORVASC) tablet 10 mg (has no administration in time range)  polyethylene glycol (MIRALAX / GLYCOLAX) packet 17 g (has no administration in time range)  enoxaparin (LOVENOX) injection 40 mg (has no administration in time range)  labetalol (NORMODYNE) injection 10 mg (has no administration in time range)  losartan (COZAAR) tablet 25 mg (has no administration in time range)  HYDROmorphone (DILAUDID) injection 1 mg (1 mg Intramuscular Given 11/04/19 1357)  ceFEPIme (MAXIPIME) 2 g in sodium chloride 0.9 % 100 mL IVPB (0 g Intravenous Stopped 11/04/19 1653)  sodium chloride 0.9 % bolus 1,000 mL (1,000 mLs Intravenous New Bag/Given 11/04/19 1616)    ED Course  I have reviewed the triage vital signs and the nursing notes.  Pertinent labs & imaging results that were available during my care of the patient were reviewed by me and considered in my medical decision making (see chart for details).    MDM Rules/Calculators/A&P                     59 year old female with bilateral AKA's presenting with worsening stump pain and many scabbed over lesions noted, she reports the sores on her stumps have been getting worse.  On arrival she  is afebrile, hypertensive but vitals otherwise normal.  Photo was compared to wound photos in the chart noted on 12/22 and they do look significantly worse with new area of drainage.  Will get basic labs and lactic acid.  Despite worsening wounds patient's vitals are currently stable and she does not meet sirs criteria.  We will also get x-ray of the right femur to assess for any signs of osteomyelitis.  Patient does not have a leukocytosis, hemoglobin is stable, CO2 of 18, sodium of 132, no other significant electrolyte derangements, lactic acid is 3.1, femur x-ray does not show evidence of osteomyelitis but  given lactic acidosis and worsening infection will get blood cultures and start patient on antibiotics.  I discussed this plan with the patient who is in agreement, after pain medication she is more comfortable.  Consult placed for medicine admission.  Case discussed with Dr. Tawanna Solo who will see and admit the patient, request MRI of the right femur to assess for any osteomyelitis.  Final Clinical Impression(s) / ED Diagnoses Final diagnoses:  Wound infection  S/P bilateral above knee amputation [facility])    Rx / DC Orders ED Discharge Orders    None       Jacqlyn Larsen, PA-C 11/04/19 1718    Lennice Sites, DO 11/04/19 1944

## 2019-11-04 NOTE — ED Triage Notes (Addendum)
Arrives via EMS from home, bilateral above the knee amputee, C/C sores to both amputations, reports these sores are very painful. Patient reports she tried to medication she was prescribed but it is not working for her. Pt had 150 mcg intranasal fentanyl with EMS. CBG 119.

## 2019-11-04 NOTE — Progress Notes (Addendum)
Report received from ED 

## 2019-11-04 NOTE — ED Notes (Signed)
Report given to Kim, RN.

## 2019-11-04 NOTE — Progress Notes (Signed)
Pharmacy Antibiotic Note  [name] is a 59 y.o. female with bilateral AKA admitted on 11/04/2019 with stump infection.  Pharmacy has been consulted for vancomycin and Zosyn dosing.  Plan:  Vancomycin 1000 mg IV now, then 750 mg IV q24 hr (est AUC 528 based on SCr 0.94; Vd 0.72)  Measure vancomycin AUC at steady state as indicated  SCr q24 while on vanc + Zosyn  Zosyn 3.375 g IV given once over 30 minutes, then every 8 hrs by 4-hr infusion      Temp (24hrs), Avg:97.9 F (36.6 C), Min:97.9 F (36.6 C), Max:97.9 F (36.6 C)  Recent Labs  Lab 11/04/19 1407 11/04/19 1607  WBC 8.2  --   CREATININE 0.94  --   LATICACIDVEN 3.1* 3.6*    CrCl cannot be calculated (Unknown ideal weight.).    Allergies  Allergen Reactions  . Levamisole     Levamisole is a chemical adulterant of cocaine which can cause cutaneous necrosis It is used as an Health and safety inspector in veterinary medicine  . Acetaminophen Swelling and Other (See Comments)    "Eyelid swelling" but apparently tolerates Percocet and Vicodin just fine...  . Haldol [Haloperidol Lactate]     Possible eye swelling was given together with Toradol unsure if caused the reaction  . Lisinopril     UNSPECIFIED REACTION   . Toradol [Ketorolac Tromethamine]     Eye swelling  . Tramadol Rash    Thank you for allowing pharmacy to be a part of this patient's care.  [name] A 11/04/2019 7:50 PM

## 2019-11-05 ENCOUNTER — Encounter (HOSPITAL_COMMUNITY): Payer: Self-pay | Admitting: Internal Medicine

## 2019-11-05 ENCOUNTER — Inpatient Hospital Stay (HOSPITAL_COMMUNITY): Payer: Medicaid Other

## 2019-11-05 DIAGNOSIS — Z89611 Acquired absence of right leg above knee: Secondary | ICD-10-CM

## 2019-11-05 DIAGNOSIS — Z89612 Acquired absence of left leg above knee: Secondary | ICD-10-CM

## 2019-11-05 DIAGNOSIS — E440 Moderate protein-calorie malnutrition: Secondary | ICD-10-CM

## 2019-11-05 DIAGNOSIS — L03115 Cellulitis of right lower limb: Secondary | ICD-10-CM

## 2019-11-05 DIAGNOSIS — R6520 Severe sepsis without septic shock: Secondary | ICD-10-CM

## 2019-11-05 DIAGNOSIS — T8781 Dehiscence of amputation stump: Secondary | ICD-10-CM

## 2019-11-05 DIAGNOSIS — A419 Sepsis, unspecified organism: Principal | ICD-10-CM

## 2019-11-05 DIAGNOSIS — N179 Acute kidney failure, unspecified: Secondary | ICD-10-CM

## 2019-11-05 LAB — CBC
HCT: 33.7 % — ABNORMAL LOW (ref 36.0–46.0)
Hemoglobin: 10.2 g/dL — ABNORMAL LOW (ref 12.0–15.0)
MCH: 26.2 pg (ref 26.0–34.0)
MCHC: 30.3 g/dL (ref 30.0–36.0)
MCV: 86.4 fL (ref 80.0–100.0)
Platelets: 171 10*3/uL (ref 150–400)
RBC: 3.9 MIL/uL (ref 3.87–5.11)
RDW: 18.4 % — ABNORMAL HIGH (ref 11.5–15.5)
WBC: 11.5 10*3/uL — ABNORMAL HIGH (ref 4.0–10.5)
nRBC: 0 % (ref 0.0–0.2)

## 2019-11-05 LAB — RAPID URINE DRUG SCREEN, HOSP PERFORMED
Amphetamines: NOT DETECTED
Barbiturates: NOT DETECTED
Benzodiazepines: NOT DETECTED
Cocaine: POSITIVE — AB
Opiates: POSITIVE — AB
Tetrahydrocannabinol: NOT DETECTED

## 2019-11-05 LAB — BASIC METABOLIC PANEL
Anion gap: 10 (ref 5–15)
BUN: 32 mg/dL — ABNORMAL HIGH (ref 6–20)
CO2: 18 mmol/L — ABNORMAL LOW (ref 22–32)
Calcium: 8.5 mg/dL — ABNORMAL LOW (ref 8.9–10.3)
Chloride: 106 mmol/L (ref 98–111)
Creatinine, Ser: 0.86 mg/dL (ref 0.44–1.00)
GFR calc Af Amer: >60 mL/min (ref >60)
GFR calc non Af Amer: >60 mL/min (ref >60)
Glucose, Bld: 115 mg/dL — ABNORMAL HIGH (ref 70–99)
Potassium: 4.3 mmol/L (ref 3.5–5.1)
Sodium: 134 mmol/L — ABNORMAL LOW (ref 135–145)

## 2019-11-05 LAB — LACTIC ACID, PLASMA: Lactic Acid, Venous: 2.2 mmol/L (ref 0.5–1.9)

## 2019-11-05 LAB — MRSA PCR SCREENING: MRSA by PCR: POSITIVE — AB

## 2019-11-05 MED ORDER — ORAL CARE MOUTH RINSE
15.0000 mL | Freq: Two times a day (BID) | OROMUCOSAL | Status: DC
  Administered 2019-11-05 – 2019-11-10 (×9): 15 mL via OROMUCOSAL

## 2019-11-05 MED ORDER — MUPIROCIN 2 % EX OINT
1.0000 "application " | TOPICAL_OINTMENT | Freq: Two times a day (BID) | CUTANEOUS | Status: AC
  Administered 2019-11-05 – 2019-11-09 (×9): 1 via NASAL
  Filled 2019-11-05 (×3): qty 22

## 2019-11-05 MED ORDER — GADOBUTROL 1 MMOL/ML IV SOLN
5.0000 mL | Freq: Once | INTRAVENOUS | Status: AC | PRN
  Administered 2019-11-05: 5 mL via INTRAVENOUS

## 2019-11-05 MED ORDER — CHLORHEXIDINE GLUCONATE CLOTH 2 % EX PADS
6.0000 | MEDICATED_PAD | Freq: Every day | CUTANEOUS | Status: AC
  Administered 2019-11-05 – 2019-11-09 (×4): 6 via TOPICAL

## 2019-11-05 NOTE — Consult Note (Signed)
ORTHOPAEDIC CONSULTATION  REQUESTING PHYSICIAN: Nita Sells, MD  Chief Complaint: Painful infected bilateral above-the-knee amputations.  HPI: [name] is a 59 y.o. female who presents with infected bilateral above-knee amputations with purulent drainage from the right above-the-knee amputation and painful ulceration on the left.  Patient is over 2-1/2 years out from her bilateral above-the-knee amputations in May 2019.  Past Medical History:  Diagnosis Date  . CAP (community acquired pneumonia) 03/2014 X 2  . Cocaine abuse ([facility])    ongoing with resultant vaculitis.  . Depression   . Headache    "weekly" (07/29/2016)  . Hypertension   . Inflammatory arthritis   . Migraines    "probably 5-6/yr" (07/29/2016)  . Normocytic anemia    BL Hgb 9.8-12. Last anemia panel 04/2010 - showing Fe 19, ferritin 101.  Pt on monthly B12 injections  . Rheumatoid arthritis(714.0)    patient reported  . S/P bilateral below knee amputation ([place]) 04/13/2017  . 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 Followed by Dr. Louanne Skye   Past Surgical History:  Procedure Laterality Date  . AMPUTATION Left 05/22/2016   Procedure: AMPUTATION LEFT LONG FINGER;  Surgeon: Marybelle Killings, MD;  Location: [place];  Service: Orthopedics;  Laterality: Left;  . AMPUTATION Bilateral 04/10/2017   Procedure: AMPUTATION BELOW KNEE;  Surgeon: Newt Minion, MD;  Location: [facility];  Service: Orthopedics;  Laterality: Bilateral;  . AMPUTATION Bilateral 02/06/2018   Procedure: AMPUTATION ABOVE KNEE;  Surgeon: Newt Minion, MD;  Location: [place];  Service: Orthopedics;  Laterality: Bilateral;  . HERNIA REPAIR     "stomach"  . I & D EXTREMITY Right 09/26/2015   Procedure: IRRIGATION AND DEBRIDEMENT LEG WOUND  VAC PLACEMENT.;  Surgeon: Loel Lofty Dillingham, DO;  Location: [facility];  Service: Plastics;  Laterality: Right;  . INCISION AND DRAINAGE OF WOUND Bilateral 10/20/2016   Procedure: IRRIGATION AND DEBRIDEMENT WOUND BILATERAL;  Surgeon: Edrick Kins, DPM;  Location: [facility] [place];  Service: Podiatry;  Laterality: Bilateral;  . IRRIGATION AND DEBRIDEMENT ABSCESS Bilateral 09/26/2013   Procedure: DEBRIDEMENT ULCERS BILATERAL THIGHS;  Surgeon: Gwenyth Ober, MD;  Location: [facility];  Service: General;  Laterality: Bilateral;  . SKIN BIOPSY Bilateral    shin nodules   Social History   Socioeconomic History  . Marital status: Single    Spouse name: Not on file  . Number of children: 0  . Years of education: 11th grade  . Highest education level: Not on file  Occupational History  . Occupation: Disability    Comment: since 2011, due to her rheumatoid arthritis  Tobacco Use  . Smoking status: Current Every Day Smoker    Packs/day: 0.12    Years: 38.00    Pack years: 4.56    Types: Cigarettes  . Smokeless tobacco: Never Used  . Tobacco comment: 2 A DAY  Substance and Sexual Activity  . Alcohol use: No    Alcohol/week: 0.0 standard drinks  . Drug use: Yes    Types: "Crack" cocaine, Cocaine    Comment: 08/19/2019  . Sexual activity: Not Currently  Other Topics Concern  . Not on file  Social History Narrative   Unemployed:  cleaning in past   Living at [facility]; has Medicaid.   crack/cocaine use; pt denies IVDU   tobacco:  1/2 ppd, trying to quit   alcohol:  none       Social Determinants  of Health   Financial Resource Strain:   . Difficulty of Paying Living Expenses: Not on file  Food Insecurity:   . Worried About Charity fundraiser in the Last Year: Not on file  . Ran Out of Food in the Last Year: Not on file  Transportation Needs:   . Lack of Transportation (Medical): Not on file  . Lack of Transportation (Non-Medical): Not on file  Physical Activity:   . Days of Exercise per Week: Not on file  . Minutes of Exercise per Session: Not on file  Stress:   . Feeling of Stress : Not on file  Social Connections:   . Frequency of Communication  with Friends and Family: Not on file  . Frequency of Social Gatherings with Friends and Family: Not on file  . Attends Religious Services: Not on file  . Active Member of Clubs or Organizations: Not on file  . Attends Archivist Meetings: Not on file  . Marital Status: Not on file   Family History  Problem Relation Age of Onset  . Breast cancer Mother        Breast cancer  . Alcohol abuse Mother   . Colon cancer Maternal Aunt 61  . Alcohol abuse Father    - negative except otherwise stated in the family history section Allergies  Allergen Reactions  . Levamisole     Levamisole is a chemical adulterant of cocaine which can cause cutaneous necrosis It is used as an Health and safety inspector in veterinary medicine  . Acetaminophen Swelling and Other (See Comments)    "Eyelid swelling" but apparently tolerates Percocet and Vicodin just fine...  . Haldol [Haloperidol Lactate]     Possible eye swelling was given together with Toradol unsure if caused the reaction  . Lisinopril     UNSPECIFIED REACTION   . Toradol [Ketorolac Tromethamine]     Eye swelling  . Tramadol Rash   Prior to Admission medications   Medication Sig Start Date End Date Taking? Authorizing Provider  doxycycline (VIBRAMYCIN) 100 MG capsule Take 1 capsule (100 mg total) by mouth 2 (two) times daily for 7 days. 10/31/19 11/07/19 Yes Khatri, Hina, PA-C  lidocaine (XYLOCAINE) 5 % ointment Apply 1 application topically as needed. 10/31/19  Yes Khatri, Hina, PA-C  amLODipine (NORVASC) 10 MG tablet Take 1 tablet (10 mg total) by mouth daily. Patient not taking: Reported on 11/04/2019 08/25/19   Azzie Glatter, FNP  nystatin cream (MYCOSTATIN) Apply to affected area 2 times daily Patient not taking: Reported on 11/04/2019 09/18/19   Lacretia Leigh, MD  oxyCODONE (OXY IR/ROXICODONE) 5 MG immediate release tablet Take 1 tablet (5 mg total) by mouth every 6 (six) hours as needed for moderate pain. Patient not taking:  Reported on 11/04/2019 10/13/19   Hayden Rasmussen, MD  polyethylene glycol (MIRALAX / GLYCOLAX) 17 g packet Take 17 g by mouth daily. Patient not taking: Reported on 11/04/2019 09/30/19   Jeanmarie Hubert, MD   DG Femur Min 2 Views Right  Result Date: 11/04/2019 CLINICAL DATA:  Pain at the level of above knee amputation stump. EXAM: RIGHT FEMUR 2 VIEWS COMPARISON:  08/13/2018 FINDINGS: Stable appearance of residual right femur with no evidence of fracture or bony destruction. Stable benign-appearing sclerotic region in the femoral neck. Surrounding soft tissues appear unremarkable. No soft tissue foreign body identified. IMPRESSION: No evidence of fracture or radiographic evidence to suggest osteomyelitis. Electronically Signed   By: Aletta Edouard M.D.   On: 11/04/2019  15:03   - pertinent xrays, CT, MRI studies were reviewed and independently interpreted  Positive ROS: All other systems have been reviewed and were otherwise negative with the exception of those mentioned in the HPI and as above.  Physical Exam: General: Alert, no acute distress Psychiatric: Patient is competent for consent with normal mood and affect Lymphatic: No axillary or cervical lymphadenopathy Cardiovascular: No pedal edema Respiratory: No cyanosis, no use of accessory musculature GI: No organomegaly, abdomen is soft and non-tender    Images:  @ENCIMAGES @  Labs:  Lab Results  Component Value Date   HGBA1C 5.1 08/25/2019   HGBA1C 5.0 05/30/2018   HGBA1C 5.5 03/30/2017   ESRSEDRATE 10 09/27/2019   ESRSEDRATE 8 07/11/2018   ESRSEDRATE 104 (H) 04/08/2017   CRP 1.8 (H) 09/27/2019   CRP 5.3 (H) 08/02/2018   CRP 1.5 (H) 07/11/2018   LABURIC 3.7 10/29/2010   LABURIC 3.4 10/22/2010   LABURIC 2.9 05/31/2009   REPTSTATUS PENDING 11/04/2019   GRAMSTAIN  09/26/2019    RARE WBC PRESENT, PREDOMINANTLY PMN RARE GRAM POSITIVE COCCI Performed at [facility], [place] [address], [place], [place] [address]     CULT PENDING 11/04/2019   LABORGA METHICILLIN RESISTANT STAPHYLOCOCCUS AUREUS 09/26/2019    Lab Results  Component Value Date   ALBUMIN 2.7 (L) 09/28/2019   ALBUMIN 2.8 (L) 09/27/2019   ALBUMIN 3.3 (L) 09/26/2019   PREALBUMIN 13.6 (L) 04/08/2017   PREALBUMIN 14.7 (L) 10/11/2013   LABURIC 3.7 10/29/2010   LABURIC 3.4 10/22/2010   LABURIC 2.9 05/31/2009    Neurologic: Patient does not have protective sensation bilateral lower extremities.   MUSCULOSKELETAL:   Skin: Examination patient has ulcers on the upper and lower extremities she has painful ulcers on the left above-knee amputation with redness and tenderness to palpation.  Right above-the-knee amputation has purulent drainage also is painful to palpation with cellulitis.  Patient has an albumin recently 2.7 with the most recent hemoglobin A1c of 5.1.  Radiographs show no destructive bony changes.  Assessment: Assessment: Cellulitis and ulceration bilateral above-the-knee amputations.  Plan: Plan: Patient will need revision of bilateral above-knee amputations.  I have surgical time at [facility] on Wednesday morning and will post her for surgery at [facility] on Wednesday morning.  Patient will need to be transferred to [facility].  Thank you for the consult and the opportunity to see Ms. Burns Spain, MD [facility] [phone] 10:58 AM

## 2019-11-05 NOTE — Progress Notes (Signed)
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  Cirrhosis,  HFpEF Initial presentation to ED 10/31/2019 pain and discharge from right AKA discharge oral doxycycline found to have lactic acidosis 3 drainage from stump x-ray did not show osteomyelitis MRI was ordered  Data Reviewed:  Sodium 134, bicarb 18 BUN/creatinine 32/0.8 up from baseline lactic acid 3.6-->2.2 WBC 11.5 Platelet 171 Tox screen + cocaine, + opiates, MRSA positive  Assessment & Plan:  Cellulitis versus abscess or osteomyelitis right AKA stump PVD MRI neg for Osteo Continue IV Vanc Zosyn--Needs likely surgical revision of bilat AKA per Dr. 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 Sharol Given  Objective: Vitals:   11/04/19 1741 11/04/19 2046 11/05/19 0417 11/05/19 0500  BP: (!) 152/95 (!) 123/97 (!) 148/96   Pulse: 89 88 91   Resp: 16 20 20    Temp:  (!) 97.5 F (36.4 C) 97.9 F (36.6 C)   TempSrc:  Axillary Oral   SpO2: 92% 92% 93%   Weight:    40.5 kg  Height:    5' 1.5" (1.562 m)    Intake/Output Summary (Last 24 hours) at 11/05/2019 0949 Last data filed at 11/05/2019 0130 Gross per 24 hour  Intake  907.11 ml  Output --  Net 907.11 ml   Filed Weights   11/05/19 0500  Weight: 40.5 kg    Examination: Awake coherent in some pain eomi mild ict saggy bags under eyes Poor dentition cta b no added sound Neuro intact no focal deficit AKA bilat Oozing from RLE wound on AKa and lesser extent on L side Disseminated papular scarrind to stumps with some scab  Scheduled Meds: . amLODipine  10 mg Oral Daily  . Chlorhexidine Gluconate Cloth  6 each Topical Q0600  . enoxaparin (LOVENOX) injection  40 mg Subcutaneous Q24H  . losartan  25 mg Oral Daily  . mouth rinse  15 mL Mouth Rinse BID  . mupirocin ointment  1 application Nasal BID  . polyethylene glycol  17 g Oral Daily   Continuous Infusions: . sodium chloride 100 mL/hr at 11/05/19 0345  . piperacillin-tazobactam (ZOSYN)  IV 3.375 g (11/05/19 0530)  . vancomycin       LOS: 1 day   Time spent: [place], MD [facility]  11/05/2019, 9:49 AM

## 2019-11-05 NOTE — H&P (View-Only) (Signed)
ORTHOPAEDIC CONSULTATION  REQUESTING PHYSICIAN: Nita Sells, MD  Chief Complaint: Painful infected bilateral above-the-knee amputations.  HPI: [name] is a 59 y.o. female who presents with infected bilateral above-knee amputations with purulent drainage from the right above-the-knee amputation and painful ulceration on the left.  Patient is over 2-1/2 years out from her bilateral above-the-knee amputations in May 2019.  Past Medical History:  Diagnosis Date  . CAP (community acquired pneumonia) 03/2014 X 2  . Cocaine abuse ([place])    ongoing with resultant vaculitis.  . Depression   . Headache    "weekly" (07/29/2016)  . Hypertension   . Inflammatory arthritis   . Migraines    "probably 5-6/yr" (07/29/2016)  . Normocytic anemia    BL Hgb 9.8-12. Last anemia panel 04/2010 - showing Fe 19, ferritin 101.  Pt on monthly B12 injections  . Rheumatoid arthritis(714.0)    patient reported  . S/P bilateral below knee amputation ([place]) 04/13/2017  . 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 Followed by Dr. Louanne Skye   Past Surgical History:  Procedure Laterality Date  . AMPUTATION Left 05/22/2016   Procedure: AMPUTATION LEFT LONG FINGER;  Surgeon: Marybelle Killings, MD;  Location: [place];  Service: Orthopedics;  Laterality: Left;  . AMPUTATION Bilateral 04/10/2017   Procedure: AMPUTATION BELOW KNEE;  Surgeon: Newt Minion, MD;  Location: [place];  Service: Orthopedics;  Laterality: Bilateral;  . AMPUTATION Bilateral 02/06/2018   Procedure: AMPUTATION ABOVE KNEE;  Surgeon: Newt Minion, MD;  Location: [place];  Service: Orthopedics;  Laterality: Bilateral;  . HERNIA REPAIR     "stomach"  . I & D EXTREMITY Right 09/26/2015   Procedure: IRRIGATION AND DEBRIDEMENT LEG WOUND  VAC PLACEMENT.;  Surgeon: Loel Lofty Dillingham, DO;  Location: [place];  Service: Plastics;  Laterality: Right;  . INCISION AND DRAINAGE OF WOUND Bilateral 10/20/2016   Procedure: IRRIGATION AND DEBRIDEMENT WOUND BILATERAL;  Surgeon: Edrick Kins, DPM;  Location: [place];  Service: Podiatry;  Laterality: Bilateral;  . IRRIGATION AND DEBRIDEMENT ABSCESS Bilateral 09/26/2013   Procedure: DEBRIDEMENT ULCERS BILATERAL THIGHS;  Surgeon: Gwenyth Ober, MD;  Location: [place];  Service: General;  Laterality: Bilateral;  . SKIN BIOPSY Bilateral    shin nodules   Social History   Socioeconomic History  . Marital status: Single    Spouse name: Not on file  . Number of children: 0  . Years of education: 11th grade  . Highest education level: Not on file  Occupational History  . Occupation: Disability    Comment: since 2011, due to her rheumatoid arthritis  Tobacco Use  . Smoking status: Current Every Day Smoker    Packs/day: 0.12    Years: 38.00    Pack years: 4.56    Types: Cigarettes  . Smokeless tobacco: Never Used  . Tobacco comment: 2 A DAY  Substance and Sexual Activity  . Alcohol use: No    Alcohol/week: 0.0 standard drinks  . Drug use: Yes    Types: "Crack" cocaine, Cocaine    Comment: 08/19/2019  . Sexual activity: Not Currently  Other Topics Concern  . Not on file  Social History Narrative   Unemployed:  cleaning in past   Living at [place]; has Medicaid.   crack/cocaine use; pt denies IVDU   tobacco:  1/2 ppd, trying to quit   alcohol:  none       Social Determinants  of Health   Financial Resource Strain:   . Difficulty of Paying Living Expenses: Not on file  Food Insecurity:   . Worried About Charity fundraiser in the Last Year: Not on file  . Ran Out of Food in the Last Year: Not on file  Transportation Needs:   . Lack of Transportation (Medical): Not on file  . Lack of Transportation (Non-Medical): Not on file  Physical Activity:   . Days of Exercise per Week: Not on file  . Minutes of Exercise per Session: Not on file  Stress:   . Feeling of Stress : Not on file  Social Connections:   . Frequency of Communication  with Friends and Family: Not on file  . Frequency of Social Gatherings with Friends and Family: Not on file  . Attends Religious Services: Not on file  . Active Member of Clubs or Organizations: Not on file  . Attends Archivist Meetings: Not on file  . Marital Status: Not on file   Family History  Problem Relation Age of Onset  . Breast cancer Mother        Breast cancer  . Alcohol abuse Mother   . Colon cancer Maternal Aunt 24  . Alcohol abuse Father    - negative except otherwise stated in the family history section Allergies  Allergen Reactions  . Levamisole     Levamisole is a chemical adulterant of cocaine which can cause cutaneous necrosis It is used as an Health and safety inspector in veterinary medicine  . Acetaminophen Swelling and Other (See Comments)    "Eyelid swelling" but apparently tolerates Percocet and Vicodin just fine...  . Haldol [Haloperidol Lactate]     Possible eye swelling was given together with Toradol unsure if caused the reaction  . Lisinopril     UNSPECIFIED REACTION   . Toradol [Ketorolac Tromethamine]     Eye swelling  . Tramadol Rash   Prior to Admission medications   Medication Sig Start Date End Date Taking? Authorizing Provider  doxycycline (VIBRAMYCIN) 100 MG capsule Take 1 capsule (100 mg total) by mouth 2 (two) times daily for 7 days. 10/31/19 11/07/19 Yes Khatri, Hina, PA-C  lidocaine (XYLOCAINE) 5 % ointment Apply 1 application topically as needed. 10/31/19  Yes Khatri, Hina, PA-C  amLODipine (NORVASC) 10 MG tablet Take 1 tablet (10 mg total) by mouth daily. Patient not taking: Reported on 11/04/2019 08/25/19   Azzie Glatter, FNP  nystatin cream (MYCOSTATIN) Apply to affected area 2 times daily Patient not taking: Reported on 11/04/2019 09/18/19   Lacretia Leigh, MD  oxyCODONE (OXY IR/ROXICODONE) 5 MG immediate release tablet Take 1 tablet (5 mg total) by mouth every 6 (six) hours as needed for moderate pain. Patient not taking:  Reported on 11/04/2019 10/13/19   Hayden Rasmussen, MD  polyethylene glycol (MIRALAX / GLYCOLAX) 17 g packet Take 17 g by mouth daily. Patient not taking: Reported on 11/04/2019 09/30/19   Jeanmarie Hubert, MD   DG Femur Min 2 Views Right  Result Date: 11/04/2019 CLINICAL DATA:  Pain at the level of above knee amputation stump. EXAM: RIGHT FEMUR 2 VIEWS COMPARISON:  08/13/2018 FINDINGS: Stable appearance of residual right femur with no evidence of fracture or bony destruction. Stable benign-appearing sclerotic region in the femoral neck. Surrounding soft tissues appear unremarkable. No soft tissue foreign body identified. IMPRESSION: No evidence of fracture or radiographic evidence to suggest osteomyelitis. Electronically Signed   By: Aletta Edouard M.D.   On: 11/04/2019  15:03   - pertinent xrays, CT, MRI studies were reviewed and independently interpreted  Positive ROS: All other systems have been reviewed and were otherwise negative with the exception of those mentioned in the HPI and as above.  Physical Exam: General: Alert, no acute distress Psychiatric: Patient is competent for consent with normal mood and affect Lymphatic: No axillary or cervical lymphadenopathy Cardiovascular: No pedal edema Respiratory: No cyanosis, no use of accessory musculature GI: No organomegaly, abdomen is soft and non-tender    Images:  @ENCIMAGES @  Labs:  Lab Results  Component Value Date   HGBA1C 5.1 08/25/2019   HGBA1C 5.0 05/30/2018   HGBA1C 5.5 03/30/2017   ESRSEDRATE 10 09/27/2019   ESRSEDRATE 8 07/11/2018   ESRSEDRATE 104 (H) 04/08/2017   CRP 1.8 (H) 09/27/2019   CRP 5.3 (H) 08/02/2018   CRP 1.5 (H) 07/11/2018   LABURIC 3.7 10/29/2010   LABURIC 3.4 10/22/2010   LABURIC 2.9 05/31/2009   REPTSTATUS PENDING 11/04/2019   GRAMSTAIN  09/26/2019    RARE WBC PRESENT, PREDOMINANTLY PMN RARE GRAM POSITIVE COCCI Performed at [facility], [place] [address], [place], [place] [address]     CULT PENDING 11/04/2019   LABORGA METHICILLIN RESISTANT STAPHYLOCOCCUS AUREUS 09/26/2019    Lab Results  Component Value Date   ALBUMIN 2.7 (L) 09/28/2019   ALBUMIN 2.8 (L) 09/27/2019   ALBUMIN 3.3 (L) 09/26/2019   PREALBUMIN 13.6 (L) 04/08/2017   PREALBUMIN 14.7 (L) 10/11/2013   LABURIC 3.7 10/29/2010   LABURIC 3.4 10/22/2010   LABURIC 2.9 05/31/2009    Neurologic: Patient does not have protective sensation bilateral lower extremities.   MUSCULOSKELETAL:   Skin: Examination patient has ulcers on the upper and lower extremities she has painful ulcers on the left above-knee amputation with redness and tenderness to palpation.  Right above-the-knee amputation has purulent drainage also is painful to palpation with cellulitis.  Patient has an albumin recently 2.7 with the most recent hemoglobin A1c of 5.1.  Radiographs show no destructive bony changes.  Assessment: Assessment: Cellulitis and ulceration bilateral above-the-knee amputations.  Plan: Plan: Patient will need revision of bilateral above-knee amputations.  I have surgical time at [facility] on Wednesday morning and will post her for surgery at [facility] on Wednesday morning.  Patient will need to be transferred to [facility].  Thank you for the consult and the opportunity to see Ms. Burns Spain, MD [facility] [phone] 10:58 AM

## 2019-11-05 NOTE — Progress Notes (Signed)
CRITICAL VALUE ALERT  Critical Value:  Positive MRSA  Date & Time Notied:  11/05/19 at 0100am  Provider Notified: NA; Protocol followed.   Orders Received/Actions taken: Protocol orders placed.

## 2019-11-06 ENCOUNTER — Other Ambulatory Visit: Payer: Self-pay | Admitting: Physician Assistant

## 2019-11-06 LAB — COMPREHENSIVE METABOLIC PANEL
ALT: 200 U/L — ABNORMAL HIGH (ref 0–44)
AST: 221 U/L — ABNORMAL HIGH (ref 15–41)
Albumin: 2.6 g/dL — ABNORMAL LOW (ref 3.5–5.0)
Alkaline Phosphatase: 98 U/L (ref 38–126)
Anion gap: 10 (ref 5–15)
BUN: 25 mg/dL — ABNORMAL HIGH (ref 6–20)
CO2: 18 mmol/L — ABNORMAL LOW (ref 22–32)
Calcium: 7.9 mg/dL — ABNORMAL LOW (ref 8.9–10.3)
Chloride: 109 mmol/L (ref 98–111)
Creatinine, Ser: 1.08 mg/dL — ABNORMAL HIGH (ref 0.44–1.00)
GFR calc Af Amer: >60 mL/min (ref >60)
GFR calc non Af Amer: 57 mL/min — ABNORMAL LOW (ref >60)
Glucose, Bld: 78 mg/dL (ref 70–99)
Potassium: 3.7 mmol/L (ref 3.5–5.1)
Sodium: 137 mmol/L (ref 135–145)
Total Bilirubin: 3.5 mg/dL — ABNORMAL HIGH (ref 0.3–1.2)
Total Protein: 5.9 g/dL — ABNORMAL LOW (ref 6.5–8.1)

## 2019-11-06 LAB — CBC WITH DIFFERENTIAL/PLATELET
Abs Immature Granulocytes: 0.03 10*3/uL (ref 0.00–0.07)
Basophils Absolute: 0 10*3/uL (ref 0.0–0.1)
Basophils Relative: 0 %
Eosinophils Absolute: 0.1 10*3/uL (ref 0.0–0.5)
Eosinophils Relative: 1 %
HCT: 35.4 % — ABNORMAL LOW (ref 36.0–46.0)
Hemoglobin: 10.4 g/dL — ABNORMAL LOW (ref 12.0–15.0)
Immature Granulocytes: 0 %
Lymphocytes Relative: 23 %
Lymphs Abs: 1.9 10*3/uL (ref 0.7–4.0)
MCH: 25.4 pg — ABNORMAL LOW (ref 26.0–34.0)
MCHC: 29.4 g/dL — ABNORMAL LOW (ref 30.0–36.0)
MCV: 86.3 fL (ref 80.0–100.0)
Monocytes Absolute: 0.8 10*3/uL (ref 0.1–1.0)
Monocytes Relative: 10 %
Neutro Abs: 5.2 10*3/uL (ref 1.7–7.7)
Neutrophils Relative %: 66 %
Platelets: 202 10*3/uL (ref 150–400)
RBC: 4.1 MIL/uL (ref 3.87–5.11)
RDW: 18.6 % — ABNORMAL HIGH (ref 11.5–15.5)
WBC: 8 10*3/uL (ref 4.0–10.5)
nRBC: 0 % (ref 0.0–0.2)

## 2019-11-06 LAB — VANCOMYCIN, RANDOM: Vancomycin Rm: 11

## 2019-11-06 MED ORDER — VANCOMYCIN HCL 750 MG/150ML IV SOLN
750.0000 mg | INTRAVENOUS | Status: DC
  Administered 2019-11-06 – 2019-11-08 (×2): 750 mg via INTRAVENOUS
  Filled 2019-11-06 (×6): qty 150

## 2019-11-06 MED ORDER — VANCOMYCIN VARIABLE DOSE PER UNSTABLE RENAL FUNCTION (PHARMACIST DOSING): Status: DC

## 2019-11-06 MED ORDER — ENSURE ENLIVE PO LIQD
237.0000 mL | Freq: Two times a day (BID) | ORAL | Status: DC
  Administered 2019-11-06 – 2019-11-10 (×4): 237 mL via ORAL

## 2019-11-06 MED ORDER — ADULT MULTIVITAMIN W/MINERALS CH
1.0000 | ORAL_TABLET | Freq: Every day | ORAL | Status: DC
  Administered 2019-11-06 – 2019-11-10 (×4): 1 via ORAL
  Filled 2019-11-06 (×4): qty 1

## 2019-11-06 MED ORDER — PRO-STAT SUGAR FREE PO LIQD
30.0000 mL | Freq: Two times a day (BID) | ORAL | Status: DC
  Administered 2019-11-06 (×2): 30 mL via ORAL
  Filled 2019-11-06 (×4): qty 30

## 2019-11-06 NOTE — TOC Initial Note (Signed)
Transition of Care [facility]) - Initial/Assessment Note    Patient Details  Name: [name] MRN: [id number] Date of Birth: 08/04/1961  Transition of Care [facility]) CM/SW Contact:    Lynnell Catalan, RN Phone Number: 11/06/2019, 11:57 AM  Clinical Narrative:                   Expected Discharge Plan: Home/Self Care(From boarding house.) Barriers to Discharge: Continued Medical Work up   Expected Discharge Plan and Services Expected Discharge Plan: Home/Self Care(From boarding house.)   Discharge Planning Services: CM Consult   Living arrangements for the past 2 months: [place]                     Prior Living Arrangements/Services Living arrangements for the past 2 months: [place] with:: Roommate                   Activities of Daily Living Home Assistive Devices/Equipment: Bedside commode/3-in-1, Blood pressure cuff, Built-in shower seat, Dentures (specify type), Eyeglasses, Nebulizer, Raised toilet seat with rails, Shower chair with back, Wheelchair(Per patient (?poor historian)) ADL Screening (condition at time of admission) Patient's cognitive ability adequate to safely complete daily activities?: No(disoriented to time at this time. ) Is the patient deaf or have difficulty hearing?: No Does the patient have difficulty seeing, even when wearing glasses/contacts?: No Does the patient have difficulty concentrating, remembering, or making decisions?: Yes Patient able to express need for assistance with ADLs?: No(forgetful at this time) Does the patient have difficulty dressing or bathing?: Yes Independently performs ADLs?: No Communication: Independent Dressing (OT): Needs assistance Is this a change from baseline?: Pre-admission baseline(per patient) Grooming: Needs assistance Is this a change from baseline?: Pre-admission baseline Feeding: Independent Bathing: Needs assistance Is this a change from baseline?: Pre-admission baseline Toileting:  Needs assistance Is this a change from baseline?: Pre-admission baseline In/Out Bed: Needs assistance Is this a change from baseline?: Pre-admission baseline Walks in Home: Needs assistance(assistance with wheelchair) Is this a change from baseline?: Pre-admission baseline Does the patient have difficulty walking or climbing stairs?: Yes Weakness of Legs: Both(bilateral above the knee amputation) Weakness of Arms/Hands: None    Admission diagnosis:  Cellulitis [L03.90] Wound infection [T14.8XXA, L08.9] S/P bilateral above knee amputation ([place]) [Z89.611, Z89.612] Sepsis ([place]) [A41.9] Patient Active Problem List   Diagnosis Date Noted  . Stump pain ([place]) 09/27/2019  . Anemia   . Cellulitis 09/26/2019  . Hyperglycemia 08/27/2019  . Disability of walking 05/28/2019  . At risk for adverse drug event 08/30/2018  . Microcytic hypochromic anemia 08/30/2018  . Thrombocytopenia ([place]) 08/20/2018  . Toxic encephalopathy 08/20/2018  . Goals of care, counseling/discussion   . Palliative care by specialist   . DNR (do not resuscitate) discussion   . Infiltrate noted on imaging study   . Ulcer of right thigh, limited to breakdown of skin ([place])   . S/P bilateral above knee amputation ([place])   . Homelessness 07/30/2018  . Elevated LFTs 07/30/2018  . Osteomyelitis ([place]) 07/30/2018  . Acute encephalopathy 07/30/2018  . Eye swelling, bilateral 07/30/2018  . Skin rash 07/11/2018  . Acute on chronic diastolic CHF (congestive heart failure) ([place]) 06/21/2018  . Malnutrition of moderate degree 05/20/2018  . Acute congestive heart failure ([place])   . Suicidal ideation 02/02/2018  . HCAP (healthcare-associated pneumonia) 02/02/2018  . Acute on chronic respiratory failure with hypoxia ([place]) 02/02/2018  . Sepsis ([place]) 02/02/2018  . Dehiscence of amputation stump ([place]) 10/23/2017  .  Cocaine abuse with cocaine-induced mood disorder ([place]) 08/10/2017  . Hypertensive crisis   . Phantom limb pain ([place])   .  Tobacco abuse   . Amputation stump pain ([place])   . Acute blood loss anemia   . Atherosclerosis of native arteries of extremities with gangrene, bilateral legs ([place])   . AKI (acute kidney injury) ([place]) 02/07/2017  . Acute kidney injury ([place]) 02/06/2017  . Wound healing, delayed   . Chest pain 04/07/2015  . Protein-calorie malnutrition, severe ([place]) 01/15/2015  . Cocaine abuse ([place])   . MDD (major depressive disorder), recurrent episode, severe ([place]) 10/16/2014  . Cocaine use disorder, severe, dependence ([place]) 10/10/2014  . Cocaine-induced vascular disorder ([place]) 06/19/2013  . Essential hypertension 02/26/2010   PCP:  Azzie Glatter, FNP Pharmacy:   [facility] [place], [place] [address] Phone: [phone] Fax: [phone]     Social Determinants of Health (SDOH) Interventions    Readmission Risk Interventions Readmission Risk Prevention Plan 11/06/2019 09/29/2019 09/28/2019  Transportation Screening Complete Complete Complete  Transportation Screening Comment - - -  PCP or Specialist Appt within 3-5 Days - - Complete  Not Complete comments - - -  HRI or [place] for [place] Planning/Counseling - - -  [place] - - -  Medication Review Press photographer) Complete - -  Palliative Care Screening Not Applicable - -  [place] Not Applicable - -  Some recent data might be hidden

## 2019-11-06 NOTE — Progress Notes (Signed)
Initial Nutrition Assessment  INTERVENTION:   -Ensure Enlive po BID, each supplement provides 350 kcal and 20 grams of protein -Prostat liquid protein PO 30 ml BID with meals, each supplement provides 100 kcal, 15 grams protein. -Multivitamin with minerals daily  NUTRITION DIAGNOSIS:   Increased nutrient needs related to wound healing, post-op healing as evidenced by estimated needs.  GOAL:   Patient will meet greater than or equal to 90% of their needs  MONITOR:   PO intake, Supplement acceptance, Labs, Weight trends, I & O's, Skin  REASON FOR ASSESSMENT:   Consult Assessment of nutrition requirement/status  ASSESSMENT:   59 y.o. female with medical history significant of bilateral AKA, cocaine induced vasculopathy, inflammatory arthritis, hypertension not taking any medication at home presented to the emergency department with complaints of severe pain, discharge from her right AKA stump.  She had presented on 10/31/2019 here in the emergency department with similar complaint and she was discharged on oral doxycycline which did not help.  Patient has history of chronic pain on bilateral stump but this time this time pain was worse on the right side.  Patient has also noticed numerous eruption on her skin and some of them look like infected.  She has history of levamisole-induced vasculitis.  She has long history of cocaine abuse.  She was admitted with similar complaint on 09/29/2019 with suspicion of sepsis but her blood cultures were negative.  She has poor wound healing with chronic  skin changes which are suspected secondary to cocaine use and levamisole-induced vasculitis.  She had bilateral AKA by Dr. Sharol Given for nonhealing wounds secondary to peripheral vascular disease.  She has been admitted several times for cellulitis of the stump.  **RD working remotely**  Patient with history of bilateral AKAs (2019). Pt has been using cocaine PTA.  Pt admitted with pain in AKA stumps. Per  chart review, pt scheduled to transfer to [facility] for stump revision surgery on 2/3.  Given needs for healing and future post-op healing, will add Ensure and Prostat supplements for additional kcals and protein. No PO documented for admission yet.  Per weight records, pt has lost 11 lbs since 04/16/19 (11% wt loss x 6.5 months, significant for time frame).  I/Os: +4.4L since admit  Medications reviewed. Labs reviewed: UDS+ for opiates, cocaine  NUTRITION - FOCUSED PHYSICAL EXAM:  Working remotely.  Diet Order:   Diet Order            Diet Heart Room service appropriate? Yes; Fluid consistency: Thin  Diet effective now              EDUCATION NEEDS:   No education needs have been identified at this time  Skin:  Skin Assessment: Skin Integrity Issues: Skin Integrity Issues:: Other (Comment) Other: bilateral AKAs  Last BM:  1/31  Height:   Ht Readings from Last 1 Encounters:  11/05/19 5' 1.5" (1.562 m)    Weight:   Wt Readings from Last 1 Encounters:  11/05/19 40.5 kg    Ideal Body Weight:  38.1 kg(adjusted for bilateral AKAs)  BMI:  Body mass index is 16.6 kg/m.  Estimated Nutritional Needs:   Kcal:  1400-1600  Protein:  60-75g  Fluid:  1.5L/day  Clayton Bibles, MS, RD, LDN Inpatient Clinical Dietitian Pager: [phone] After Hours Pager: [phone]

## 2019-11-06 NOTE — Progress Notes (Signed)
Pharmacy Antibiotic Note  [name] is a 59 y.o. female with bilateral AKA admitted on 11/04/2019 with stump infection.  Pharmacy has been consulted for vancomycin and Zosyn dosing.  11/06/2019 Scr 1.08, CrCl ~ 37.2 mls/min (increased from admission) Random vanc =11 (before 3rd dose was due)  Plan:  Continue vancomycin 750 mg IV q24 hr (est AUC 528 based on SCr 0.94; Vd 0.72)  SCr q24 while on vanc + Zosyn  Continue Zosyn 3.375 g IV given once over 30 minutes, then every 8 hrs by 4-hr infusion  Measure vancomycin AUC at steady state as indicated   Height: 5' 1.5" (156.2 cm) Weight: 89 lb 4.6 oz (40.5 kg) IBW/kg (Calculated) : 48.95  Temp (24hrs), Avg:97.7 F (36.5 C), Min:97.6 F (36.4 C), Max:97.8 F (36.6 C)  Recent Labs  Lab 11/04/19 1407 11/04/19 1607 11/05/19 0534 11/06/19 0436 11/06/19 1007  WBC 8.2  --  11.5* 8.0  --   CREATININE 0.94  --  0.86 1.08*  --   LATICACIDVEN 3.1* 3.6* 2.2*  --   --   VANCORANDOM  --   --   --   --  11    Estimated Creatinine Clearance: 37.2 mL/min (A) (by C-G formula based on SCr of 1.08 mg/dL (H)).    Allergies  Allergen Reactions  . Levamisole     Levamisole is a chemical adulterant of cocaine which can cause cutaneous necrosis It is used as an Health and safety inspector in veterinary medicine  . Acetaminophen Swelling and Other (See Comments)    "Eyelid swelling" but apparently tolerates Percocet and Vicodin just fine...  . Haldol [Haloperidol Lactate]     Possible eye swelling was given together with Toradol unsure if caused the reaction  . Lisinopril     UNSPECIFIED REACTION   . Toradol [Ketorolac Tromethamine]     Eye swelling  . Tramadol Rash    Thank you for allowing pharmacy to be a part of this patient's care.  Dolly Rias RPh 11/06/2019, 10:52 AM

## 2019-11-06 NOTE — Progress Notes (Signed)
Hospitalist progress note   Patient from boardinghouse, Patient going presumably back there?, Dispo dependent on consultant input and finalaization after surgery of POC--after 11/09/2019  [name] [id number] DOB: Jan 24, 1961 DOA: 11/04/2019  PCP: Azzie Glatter, FNP   Narrative:  73 black female crack cocaine use, PVD +bilateral AKA's, HTN, migraine, anemia likely chronic disease, prior cocaine use, levamisole probable induced vasculitis with finger amputations in addition, tobacco abuse, hepatomegaly?  Cirrhosis,  HFpEF Initial presentation to ED 10/31/2019 pain and discharge from right AKA discharge oral doxycycline found to have lactic acidosis 3 drainage from stump x-ray did not show osteomyelitis MRI was ordered  Data Reviewed:  Sodium 134, bicarb 18 BUN/creatinine 32/0.8--25/1.08 lactic acid 3.6-->2.2 WBC 11.5-->8.0 Platelet 171-->202 AST 221/ALT 200, Tbili 3.5 Tox screen + cocaine, + opiates, MRSA positive  Assessment & Plan:  Cellulitis versus abscess or osteomyelitis right AKA stump PVD MRI neg for Osteo Continue IV Vanc Zosyn--scheduled 2/3 surgical revision of bilat AKA per Dr. 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 Sharol Given  Objective: Vitals:   11/05/19 0500 11/05/19 1329  11/05/19 2050 11/06/19 0642  BP:  (!) 145/95 131/87 (!) 145/101  Pulse:  86 80 86  Resp:  16 16 17   Temp:  97.6 F (36.4 C) 97.8 F (36.6 C) 97.7 F (36.5 C)  TempSrc:  Oral  Oral  SpO2:  91% (!) 88% 90%  Weight: 40.5 kg     Height: 5' 1.5" (1.562 m)       Intake/Output Summary (Last 24 hours) at 11/06/2019 1023 Last data filed at 11/06/2019 2876 Gross per 24 hour  Intake 3520.78 ml  Output --  Net 3520.78 ml   Filed Weights   11/05/19 0500  Weight: 40.5 kg    Examination: coherent eomi mild ict no pallor Poor dentition cta b no added sound Abd smiling umbilicus and mild shift dullness Neuro intact no focal deficit AKA bilat Oozing from RLE wound on AKa and lesser extent on L side papulo-macular areas with scar remain on stumps  Scheduled Meds: . amLODipine  10 mg Oral Daily  . Chlorhexidine Gluconate Cloth  6 each Topical Q0600  . enoxaparin (LOVENOX) injection  40 mg Subcutaneous Q24H  . losartan  25 mg Oral Daily  . mouth rinse  15 mL Mouth Rinse BID  . mupirocin ointment  1 application Nasal BID  . polyethylene glycol  17 g Oral Daily  . vancomycin variable dose per unstable renal function (pharmacist dosing)   Does not apply See admin instructions   Continuous Infusions: . sodium chloride 100 mL/hr at 11/06/19 0821  . piperacillin-tazobactam (ZOSYN)  IV 12.5 mL/hr at 11/06/19 0821     LOS: 2 days   Time spent: [place], MD [facility]  11/06/2019, 10:23 AM

## 2019-11-06 NOTE — Consult Note (Signed)
WOC consulted for stump wounds. Reviewed chart patient to be transferred to [facility] for stump revision surgery per Dr. Sharol Given on Wednesday.  Topical therapy; foam only until transfer.   Re consult if needed, will not follow at this time. Thanks  Ronon Ferger [employer], RN,CWOCN, CNS, [place] [phone])

## 2019-11-07 LAB — CBC WITH DIFFERENTIAL/PLATELET
Abs Immature Granulocytes: 0.06 10*3/uL (ref 0.00–0.07)
Basophils Absolute: 0 10*3/uL (ref 0.0–0.1)
Basophils Relative: 0 %
Eosinophils Absolute: 0 10*3/uL (ref 0.0–0.5)
Eosinophils Relative: 0 %
HCT: 34.9 % — ABNORMAL LOW (ref 36.0–46.0)
Hemoglobin: 10.5 g/dL — ABNORMAL LOW (ref 12.0–15.0)
Immature Granulocytes: 1 %
Lymphocytes Relative: 11 %
Lymphs Abs: 1.2 10*3/uL (ref 0.7–4.0)
MCH: 25.6 pg — ABNORMAL LOW (ref 26.0–34.0)
MCHC: 30.1 g/dL (ref 30.0–36.0)
MCV: 85.1 fL (ref 80.0–100.0)
Monocytes Absolute: 0.8 10*3/uL (ref 0.1–1.0)
Monocytes Relative: 7 %
Neutro Abs: 8.9 10*3/uL — ABNORMAL HIGH (ref 1.7–7.7)
Neutrophils Relative %: 81 %
Platelets: 193 10*3/uL (ref 150–400)
RBC: 4.1 MIL/uL (ref 3.87–5.11)
RDW: 18.9 % — ABNORMAL HIGH (ref 11.5–15.5)
WBC: 10.9 10*3/uL — ABNORMAL HIGH (ref 4.0–10.5)
nRBC: 0 % (ref 0.0–0.2)

## 2019-11-07 LAB — COMPREHENSIVE METABOLIC PANEL
ALT: 156 U/L — ABNORMAL HIGH (ref 0–44)
AST: 117 U/L — ABNORMAL HIGH (ref 15–41)
Albumin: 2.6 g/dL — ABNORMAL LOW (ref 3.5–5.0)
Alkaline Phosphatase: 90 U/L (ref 38–126)
Anion gap: 7 (ref 5–15)
BUN: 18 mg/dL (ref 6–20)
CO2: 19 mmol/L — ABNORMAL LOW (ref 22–32)
Calcium: 8.1 mg/dL — ABNORMAL LOW (ref 8.9–10.3)
Chloride: 111 mmol/L (ref 98–111)
Creatinine, Ser: 0.63 mg/dL (ref 0.44–1.00)
GFR calc Af Amer: >60 mL/min (ref >60)
GFR calc non Af Amer: >60 mL/min (ref >60)
Glucose, Bld: 91 mg/dL (ref 70–99)
Potassium: 3.2 mmol/L — ABNORMAL LOW (ref 3.5–5.1)
Sodium: 137 mmol/L (ref 135–145)
Total Bilirubin: 3.6 mg/dL — ABNORMAL HIGH (ref 0.3–1.2)
Total Protein: 6.1 g/dL — ABNORMAL LOW (ref 6.5–8.1)

## 2019-11-07 MED ORDER — KCL IN DEXTROSE-NACL 20-5-0.9 MEQ/L-%-% IV SOLN
INTRAVENOUS | Status: DC
  Filled 2019-11-07 (×2): qty 1000

## 2019-11-07 MED ORDER — ENOXAPARIN SODIUM 30 MG/0.3ML ~~LOC~~ SOLN
30.0000 mg | SUBCUTANEOUS | Status: DC
  Administered 2019-11-07 – 2019-11-09 (×3): 30 mg via SUBCUTANEOUS
  Filled 2019-11-07 (×3): qty 0.3

## 2019-11-07 MED ORDER — POTASSIUM CHLORIDE 2 MEQ/ML IV SOLN: INTRAVENOUS | Status: DC

## 2019-11-07 NOTE — Plan of Care (Signed)
  Problem: Clinical Measurements: Goal: Ability to maintain clinical measurements within normal limits will improve Outcome: Progressing   Problem: Nutrition: Goal: Adequate nutrition will be maintained Outcome: Progressing   Problem: Coping: Goal: Level of anxiety will decrease Outcome: Progressing   Problem: Pain Managment: Goal: General experience of comfort will improve Outcome: Progressing   Problem: Safety: Goal: Ability to remain free from injury will improve Outcome: Progressing   Problem: Skin Integrity: Goal: Risk for impaired skin integrity will decrease Outcome: Progressing

## 2019-11-07 NOTE — Progress Notes (Addendum)
Hospitalist progress note   Patient from boardinghouse, Patient going presumably back there?, Dispo dependent on consultant input and finalaization after surgery of POC--after 11/09/2019  [name] [id number] DOB: September 30, 1961 DOA: 11/04/2019  PCP: Azzie Glatter, FNP   Narrative:  60 black female crack cocaine use, PVD +bilateral AKA's, HTN, migraine, anemia likely chronic disease, prior cocaine use, levamisole probable induced vasculitis with finger amputations in addition, tobacco abuse, hepatomegaly?  Cirrhosis,  HFpEF Initial presentation to ED 10/31/2019 pain and discharge from right AKA discharge oral doxycycline found to have lactic acidosis 3 drainage from stump x-ray did not show osteomyelitis MRI was ordered Dr. Sharol Given orthopedics consulted, request transfer secondary to him having operative [facility] at [facility] on 2/3  Data Reviewed:  Sodium 134-->137, potassium 3.2, bicarb 19 BUN/creatinine 32/0.8--25/1.08-->18/0.6 lactic acid 3.6-->2.2 WBC 11.5-->8.0-->10.9 Platelet 171-->-->193 AST 221/ALT 200-->117/156, Tbili 3.5-->3.6 Tox screen + cocaine, + opiates, MRSA positive  Assessment & Plan:  Cellulitis versus abscess or osteomyelitis right AKA stump PVD MRI neg for Osteo Continue IV Vanc Zosyn--scheduled 2/3 surgical revision of bilat AKA per Dr. Elias Else to [facility] 2/2 Cocaine use with associated levimasole-like vasculitis Unlikely will quit Cocaine Hepatomegaly/Transaminitis--likely NASH or alcoholic cirrhosis with mild ascites Pancreatic ductal dilatation on [place] Abd 08/2018 Tells me she does not drink alcohol heavily, she does have some shifting dullness at bedside--Neg testing for Hepatitis 07/2018 Work -up in the past has included ultrasounds--Current Abx would cover if conisdered SBP Would need screening LFT as OP and further imaging Difficult to determine if she is a good candidate for diuretics given her AKI on admission and likely continued noncompliance  will need to discuss this with her prior to discharge or have her follow-up Anemia chronic disease Hemoglobin close to usual to her baseline Mild hypokalemia Start fluid containing potassium 20 at 75 cc an hour HFpEF, HTN Discontinue losartan 25, continue amlodipine 10-consider based on tomorrow a.m. labs addition of Aldactone Lasix as above discussion for liver Severe energy protein protein malnutrition with BMI 16 Poor overall insight and prognosis---very poor overall prognosis in setting of contunued drug abuse  Subjective:  Overall seems okay about the same was sleeping now is very hungry and wants breakfast Tells me she was never a heavy drinker She has feels swollen in her legs however for some time   Consultants:   Ortho Dr. Sharol Given  Objective: Vitals:   11/06/19 1450 11/06/19 2046 11/06/19 2232 11/07/19 0531  BP: (!) 158/101 (!) 166/83 (!) 150/100 (!) 145/94  Pulse: 87 93 99 95  Resp: 16 18  16   Temp: 97.7 F (36.5 C) 98.8 F (37.1 C)  98.2 F (36.8 C)  TempSrc: Oral Oral  Oral  SpO2: 90% 90%  90%  Weight:      Height:        Intake/Output Summary (Last 24 hours) at 11/07/2019 9357 Last data filed at 11/07/2019 0532 Gross per 24 hour  Intake 2899.01 ml  Output 2400 ml  Net 499.01 ml   Filed Weights   11/05/19 0500  Weight: 40.5 kg    Examination:  Pleasant coherent no distress EOMI NCAT no focal deficit No icterus no pallor S1-S2 no murmur rub or gallop Shifting dullness is present smiling umbilicus flanks are full with Doppler percussion note No asterixis Both stumps are covered with dressings She has some stigmata of her scabs of vasculitis on both elbows Neurologically intact  Scheduled Meds: . amLODipine  10 mg Oral Daily  . Chlorhexidine Gluconate Cloth  6 each Topical Q0600  . enoxaparin (LOVENOX) injection  30 mg Subcutaneous Q24H  . feeding supplement (ENSURE ENLIVE)  237 mL Oral BID BM  . feeding supplement (PRO-STAT SUGAR FREE 64)  30 mL  Oral BID  . losartan  25 mg Oral Daily  . mouth rinse  15 mL Mouth Rinse BID  . multivitamin with minerals  1 tablet Oral Daily  . mupirocin ointment  1 application Nasal BID  . polyethylene glycol  17 g Oral Daily   Continuous Infusions: . sodium chloride 100 mL/hr at 11/07/19 0609  . piperacillin-tazobactam (ZOSYN)  IV 3.375 g (11/07/19 0557)  . vancomycin Stopped (11/06/19 1616)     LOS: 3 days   Time spent: [place], MD [facility]  11/07/2019, 8:29 AM

## 2019-11-07 NOTE — Anesthesia Preprocedure Evaluation (Addendum)
Anesthesia Evaluation  Patient identified by MRN, date of birth, ID band Patient awake    Reviewed: Allergy & Precautions, H&P , NPO status , Patient's Chart, lab work & pertinent test results  Airway Mallampati: II  TM Distance: >3 FB Neck ROM: Full    Dental no notable dental hx. (+) Edentulous Upper, Edentulous Lower, Dental Advisory Given   Pulmonary Current Smoker and Patient abstained from smoking.,    Pulmonary exam normal breath sounds clear to auscultation       Cardiovascular Exercise Tolerance: Good hypertension, Pt. on medications + Peripheral Vascular Disease  + Valvular Problems/Murmurs MR  Rhythm:Regular Rate:Normal     Neuro/Psych  Headaches, Depression    GI/Hepatic negative GI ROS, Neg liver ROS,   Endo/Other  negative endocrine ROS  Renal/GU negative Renal ROS  negative genitourinary   Musculoskeletal  (+) Arthritis , Osteoarthritis,    Abdominal   Peds  Hematology  (+) Blood dyscrasia, anemia ,   Anesthesia Other Findings   Reproductive/Obstetrics negative OB ROS                            Anesthesia Physical Anesthesia Plan  ASA: III  Anesthesia Plan: General   Post-op Pain Management:    Induction: Intravenous  PONV Risk Score and Plan: 3 and Ondansetron, Dexamethasone and Midazolam  Airway Management Planned: LMA  Additional Equipment:   Intra-op Plan:   Post-operative Plan: Extubation in OR  Informed Consent: I have reviewed the patients History and Physical, chart, labs and discussed the procedure including the risks, benefits and alternatives for the proposed anesthesia with the patient or authorized representative who has indicated his/her understanding and acceptance.     Dental advisory given  Plan Discussed with: CRNA  Anesthesia Plan Comments:         Anesthesia Quick Evaluation

## 2019-11-08 ENCOUNTER — Inpatient Hospital Stay (HOSPITAL_COMMUNITY): Payer: Medicaid Other

## 2019-11-08 ENCOUNTER — Encounter (HOSPITAL_COMMUNITY): Payer: Self-pay | Admitting: Internal Medicine

## 2019-11-08 ENCOUNTER — Encounter (HOSPITAL_COMMUNITY)
Admission: EM | Disposition: A | Discharge status: Rehabilitation Facility | Payer: Self-pay | Source: Home / Self Care | Attending: Family Medicine

## 2019-11-08 DIAGNOSIS — E43 Unspecified severe protein-calorie malnutrition: Secondary | ICD-10-CM

## 2019-11-08 HISTORY — PX: STUMP REVISION: SHX6102

## 2019-11-08 LAB — MAGNESIUM: Magnesium: 1.4 mg/dL — ABNORMAL LOW (ref 1.7–2.4)

## 2019-11-08 LAB — COMPREHENSIVE METABOLIC PANEL
ALT: 113 U/L — ABNORMAL HIGH (ref 0–44)
AST: 64 U/L — ABNORMAL HIGH (ref 15–41)
Albumin: 2.2 g/dL — ABNORMAL LOW (ref 3.5–5.0)
Alkaline Phosphatase: 85 U/L (ref 38–126)
Anion gap: 9 (ref 5–15)
BUN: 9 mg/dL (ref 6–20)
CO2: 21 mmol/L — ABNORMAL LOW (ref 22–32)
Calcium: 7.8 mg/dL — ABNORMAL LOW (ref 8.9–10.3)
Chloride: 106 mmol/L (ref 98–111)
Creatinine, Ser: 0.62 mg/dL (ref 0.44–1.00)
GFR calc Af Amer: >60 mL/min (ref >60)
GFR calc non Af Amer: >60 mL/min (ref >60)
Glucose, Bld: 84 mg/dL (ref 70–99)
Potassium: 2.9 mmol/L — ABNORMAL LOW (ref 3.5–5.1)
Sodium: 136 mmol/L (ref 135–145)
Total Bilirubin: 3.5 mg/dL — ABNORMAL HIGH (ref 0.3–1.2)
Total Protein: 5.7 g/dL — ABNORMAL LOW (ref 6.5–8.1)

## 2019-11-08 LAB — CBC WITH DIFFERENTIAL/PLATELET
Abs Immature Granulocytes: 0.06 10*3/uL (ref 0.00–0.07)
Basophils Absolute: 0 10*3/uL (ref 0.0–0.1)
Basophils Relative: 0 %
Eosinophils Absolute: 0.1 10*3/uL (ref 0.0–0.5)
Eosinophils Relative: 0 %
HCT: 33.7 % — ABNORMAL LOW (ref 36.0–46.0)
Hemoglobin: 10.6 g/dL — ABNORMAL LOW (ref 12.0–15.0)
Immature Granulocytes: 1 %
Lymphocytes Relative: 14 %
Lymphs Abs: 1.6 10*3/uL (ref 0.7–4.0)
MCH: 25.5 pg — ABNORMAL LOW (ref 26.0–34.0)
MCHC: 31.5 g/dL (ref 30.0–36.0)
MCV: 81.2 fL (ref 80.0–100.0)
Monocytes Absolute: 0.7 10*3/uL (ref 0.1–1.0)
Monocytes Relative: 6 %
Neutro Abs: 9 10*3/uL — ABNORMAL HIGH (ref 1.7–7.7)
Neutrophils Relative %: 79 %
Platelets: 175 10*3/uL (ref 150–400)
RBC: 4.15 MIL/uL (ref 3.87–5.11)
RDW: 18.7 % — ABNORMAL HIGH (ref 11.5–15.5)
WBC: 11.4 10*3/uL — ABNORMAL HIGH (ref 4.0–10.5)
nRBC: 0 % (ref 0.0–0.2)

## 2019-11-08 LAB — PHOSPHORUS: Phosphorus: 2.1 mg/dL — ABNORMAL LOW (ref 2.5–4.6)

## 2019-11-08 SURGERY — REVISION, AMPUTATION SITE
Anesthesia: General | Site: Leg Upper | Laterality: Bilateral

## 2019-11-08 MED ORDER — PHENYLEPHRINE HCL-NACL 10-0.9 MG/250ML-% IV SOLN
INTRAVENOUS | Status: DC | PRN
  Administered 2019-11-08: 40 ug/min via INTRAVENOUS

## 2019-11-08 MED ORDER — 0.9 % SODIUM CHLORIDE (POUR BTL) OPTIME
TOPICAL | Status: DC | PRN
  Administered 2019-11-08: 1000 mL

## 2019-11-08 MED ORDER — HYDROMORPHONE HCL 1 MG/ML IJ SOLN: 0.2500 mg | INTRAMUSCULAR | Status: DC | PRN

## 2019-11-08 MED ORDER — LABETALOL HCL 5 MG/ML IV SOLN
5.0000 mg | INTRAVENOUS | Status: DC | PRN
  Administered 2019-11-08 (×2): 5 mg via INTRAVENOUS

## 2019-11-08 MED ORDER — FENTANYL CITRATE (PF) 250 MCG/5ML IJ SOLN
INTRAMUSCULAR | Status: DC | PRN
  Administered 2019-11-08 (×2): 50 ug via INTRAVENOUS
  Administered 2019-11-08 (×2): 25 ug via INTRAVENOUS
  Administered 2019-11-08 (×2): 50 ug via INTRAVENOUS

## 2019-11-08 MED ORDER — MIDAZOLAM HCL 2 MG/2ML IJ SOLN
INTRAMUSCULAR | Status: AC
  Filled 2019-11-08: qty 2

## 2019-11-08 MED ORDER — CHLORHEXIDINE GLUCONATE 4 % EX LIQD: 60.0000 mL | Freq: Once | CUTANEOUS | Status: DC

## 2019-11-08 MED ORDER — ONDANSETRON HCL 4 MG/2ML IJ SOLN: 4.0000 mg | Freq: Four times a day (QID) | INTRAMUSCULAR | Status: DC | PRN

## 2019-11-08 MED ORDER — LACTATED RINGERS IV SOLN: INTRAVENOUS | Status: DC

## 2019-11-08 MED ORDER — POTASSIUM CHLORIDE CRYS ER 20 MEQ PO TBCR
40.0000 meq | EXTENDED_RELEASE_TABLET | Freq: Two times a day (BID) | ORAL | Status: AC
  Administered 2019-11-08 – 2019-11-09 (×3): 40 meq via ORAL
  Filled 2019-11-08 (×3): qty 2

## 2019-11-08 MED ORDER — KETAMINE HCL 10 MG/ML IJ SOLN
INTRAMUSCULAR | Status: DC | PRN
  Administered 2019-11-08: 20 mg via INTRAVENOUS
  Administered 2019-11-08: 10 mg via INTRAVENOUS

## 2019-11-08 MED ORDER — FENTANYL CITRATE (PF) 250 MCG/5ML IJ SOLN
INTRAMUSCULAR | Status: AC
  Filled 2019-11-08: qty 5

## 2019-11-08 MED ORDER — CEFAZOLIN SODIUM-DEXTROSE 2-4 GM/100ML-% IV SOLN: 2.0000 g | INTRAVENOUS | Status: DC

## 2019-11-08 MED ORDER — DEXAMETHASONE SODIUM PHOSPHATE 10 MG/ML IJ SOLN
INTRAMUSCULAR | Status: DC | PRN
  Administered 2019-11-08: 5 mg via INTRAVENOUS

## 2019-11-08 MED ORDER — KETAMINE HCL 50 MG/5ML IJ SOSY
PREFILLED_SYRINGE | INTRAMUSCULAR | Status: AC
  Filled 2019-11-08: qty 5

## 2019-11-08 MED ORDER — LABETALOL HCL 5 MG/ML IV SOLN
INTRAVENOUS | Status: AC
  Filled 2019-11-08: qty 4

## 2019-11-08 MED ORDER — HYDROMORPHONE HCL 1 MG/ML IJ SOLN: 0.5000 mg | INTRAMUSCULAR | Status: DC | PRN

## 2019-11-08 MED ORDER — DIPHENHYDRAMINE HCL 25 MG PO CAPS
25.0000 mg | ORAL_CAPSULE | Freq: Four times a day (QID) | ORAL | Status: DC | PRN
  Administered 2019-11-08 – 2019-11-09 (×2): 25 mg via ORAL
  Filled 2019-11-08 (×2): qty 1

## 2019-11-08 MED ORDER — METOCLOPRAMIDE HCL 5 MG PO TABS: 5.0000 mg | ORAL_TABLET | Freq: Three times a day (TID) | ORAL | Status: DC | PRN

## 2019-11-08 MED ORDER — DOCUSATE SODIUM 100 MG PO CAPS
100.0000 mg | ORAL_CAPSULE | Freq: Two times a day (BID) | ORAL | Status: DC
  Administered 2019-11-08 – 2019-11-10 (×3): 100 mg via ORAL
  Filled 2019-11-08 (×4): qty 1

## 2019-11-08 MED ORDER — ONDANSETRON HCL 4 MG PO TABS: 4.0000 mg | ORAL_TABLET | Freq: Four times a day (QID) | ORAL | Status: DC | PRN

## 2019-11-08 MED ORDER — PROPOFOL 10 MG/ML IV BOLUS
INTRAVENOUS | Status: DC | PRN
  Administered 2019-11-08: 20 mg via INTRAVENOUS
  Administered 2019-11-08: 100 mg via INTRAVENOUS

## 2019-11-08 MED ORDER — SODIUM CHLORIDE 0.9 % IV SOLN: INTRAVENOUS | Status: DC

## 2019-11-08 MED ORDER — OXYCODONE HCL 5 MG PO TABS
5.0000 mg | ORAL_TABLET | ORAL | Status: DC | PRN
  Administered 2019-11-08 – 2019-11-09 (×2): 10 mg via ORAL
  Administered 2019-11-10: 5 mg via ORAL
  Filled 2019-11-08: qty 1
  Filled 2019-11-08 (×2): qty 2

## 2019-11-08 MED ORDER — PHENYLEPHRINE 40 MCG/ML (10ML) SYRINGE FOR IV PUSH (FOR BLOOD PRESSURE SUPPORT)
PREFILLED_SYRINGE | INTRAVENOUS | Status: DC | PRN
  Administered 2019-11-08: 120 ug via INTRAVENOUS
  Administered 2019-11-08: 20 ug via INTRAVENOUS
  Administered 2019-11-08: 160 ug via INTRAVENOUS
  Administered 2019-11-08: 120 ug via INTRAVENOUS
  Administered 2019-11-08 (×3): 80 ug via INTRAVENOUS

## 2019-11-08 MED ORDER — METOCLOPRAMIDE HCL 5 MG/ML IJ SOLN: 5.0000 mg | Freq: Three times a day (TID) | INTRAMUSCULAR | Status: DC | PRN

## 2019-11-08 MED ORDER — LIDOCAINE 2% (20 MG/ML) 5 ML SYRINGE
INTRAMUSCULAR | Status: DC | PRN
  Administered 2019-11-08: 60 mg via INTRAVENOUS

## 2019-11-08 MED ORDER — MIDAZOLAM HCL 2 MG/2ML IJ SOLN
INTRAMUSCULAR | Status: DC | PRN
  Administered 2019-11-08: 1 mg via INTRAVENOUS

## 2019-11-08 MED ORDER — ALBUMIN HUMAN 5 % IV SOLN: INTRAVENOUS | Status: DC | PRN

## 2019-11-08 SURGICAL SUPPLY — 34 items
BLADE SAW RECIP 87.9 MT (BLADE) IMPLANT
BLADE SURG 21 STRL SS (BLADE) ×3 IMPLANT
CANISTER WOUND CARE 500ML ATS (WOUND CARE) ×3 IMPLANT
CANISTER WOUNDNEG PRESSURE 500 (CANNISTER) ×4 IMPLANT
COVER SURGICAL LIGHT HANDLE (MISCELLANEOUS) ×3 IMPLANT
COVER WAND RF STERILE (DRAPES) ×3 IMPLANT
DRAPE EXTREMITY T 121X128X90 (DISPOSABLE) ×3 IMPLANT
DRAPE HALF SHEET 40X57 (DRAPES) ×3 IMPLANT
DRAPE INCISE IOBAN 66X45 STRL (DRAPES) ×3 IMPLANT
DRAPE U-SHAPE 47X51 STRL (DRAPES) ×6 IMPLANT
DRESSING PREVENA PLUS CUSTOM (GAUZE/BANDAGES/DRESSINGS) IMPLANT
DRSG PREVENA PLUS CUSTOM (GAUZE/BANDAGES/DRESSINGS) ×6
DURAPREP 26ML APPLICATOR (WOUND CARE) ×3 IMPLANT
ELECT REM PT RETURN 9FT ADLT (ELECTROSURGICAL) ×3
ELECTRODE REM PT RTRN 9FT ADLT (ELECTROSURGICAL) ×1 IMPLANT
GLOVE BIOGEL PI IND STRL 9 (GLOVE) ×1 IMPLANT
GLOVE BIOGEL PI INDICATOR 9 (GLOVE) ×2
GLOVE SURG ORTHO 9.0 STRL STRW (GLOVE) ×3 IMPLANT
GOWN STRL REUS W/ TWL XL LVL3 (GOWN DISPOSABLE) ×2 IMPLANT
GOWN STRL REUS W/TWL XL LVL3 (GOWN DISPOSABLE) ×6
KIT BASIN OR (CUSTOM PROCEDURE TRAY) ×3 IMPLANT
KIT TURNOVER KIT B (KITS) ×3 IMPLANT
MANIFOLD NEPTUNE II (INSTRUMENTS) ×3 IMPLANT
NS IRRIG 1000ML POUR BTL (IV SOLUTION) ×3 IMPLANT
PACK GENERAL/GYN (CUSTOM PROCEDURE TRAY) ×3 IMPLANT
PAD ARMBOARD 7.5X6 YLW CONV (MISCELLANEOUS) ×3 IMPLANT
PREVENA RESTOR ARTHOFORM 33X30 (CANNISTER) ×4 IMPLANT
STAPLER VISISTAT 35W (STAPLE) ×2 IMPLANT
SUT ETHILON 2 0 PSLX (SUTURE) ×6 IMPLANT
SUT SILK 2 0 (SUTURE) ×3
SUT SILK 2-0 18XBRD TIE 12 (SUTURE) IMPLANT
SUT VIC AB 1 CT1 27 (SUTURE) ×6
SUT VIC AB 1 CT1 27XBRD ANBCTR (SUTURE) IMPLANT
TOWEL GREEN STERILE (TOWEL DISPOSABLE) ×3 IMPLANT

## 2019-11-08 NOTE — Transfer of Care (Signed)
Immediate Anesthesia Transfer of Care Note  Patient: [name]  Procedure(s) Performed: REVISION BILATERAL ABOVE KNEE AMPUTATION (Bilateral Leg Upper)  Patient Location: PACU  Anesthesia Type:General  Level of Consciousness: drowsy and responds to stimulation  Airway & Oxygen Therapy: Patient Spontanous Breathing and Patient connected to face mask oxygen  Post-op Assessment: Report given to RN and Post -op Vital signs reviewed and stable  Post vital signs: Reviewed and stable  Last Vitals:  Vitals Value Taken Time  BP 130/89 11/08/19 1023  Temp    Pulse 86 11/08/19 1026  Resp 27 11/08/19 1026  SpO2 97 % 11/08/19 1026  Vitals shown include unvalidated device data.  Last Pain:  Vitals:   11/08/19 0854  TempSrc:   PainSc: 8       Patients Stated Pain Goal: 3 (25/61/54 8845)  Complications: No apparent anesthesia complications

## 2019-11-08 NOTE — Interval H&P Note (Signed)
History and Physical Interval Note:  11/08/2019 6:49 AM  [name]  has presented today for surgery, with the diagnosis of Painful Infected Bilateral Above Knee Amputations.  The various methods of treatment have been discussed with the patient and family. After consideration of risks, benefits and other options for treatment, the patient has consented to  Procedure(s): REVISION BILATERAL ABOVE KNEE AMPUTATION (Bilateral) as a surgical intervention.  The patient's history has been reviewed, patient examined, no change in status, stable for surgery.  I have reviewed the patient's chart and labs.  Questions were answered to the patient's satisfaction.     Newt Minion

## 2019-11-08 NOTE — Plan of Care (Signed)
  Problem: Skin Integrity: Goal: Risk for impaired skin integrity will decrease Outcome: Progressing   Problem: Safety: Goal: Ability to remain free from injury will improve Outcome: Progressing   Problem: Elimination: Goal: Will not experience complications related to bowel motility Outcome: Progressing   Problem: Clinical Measurements: Goal: Will remain free from infection Outcome: Progressing

## 2019-11-08 NOTE — Op Note (Signed)
11/08/2019  10:38 AM  PATIENT:  [name]    PRE-OPERATIVE DIAGNOSIS:  Painful Infected Bilateral Above Knee Amputations  POST-OPERATIVE DIAGNOSIS:  Same  PROCEDURE:  REVISION BILATERAL ABOVE KNEE AMPUTATION Application of bilateral wound vacs with Praveena customizable and Worthy Keeler form for both lower extremities.  SURGEON:  Newt Minion, MD  PHYSICIAN ASSISTANT:None ANESTHESIA:   General  PREOPERATIVE INDICATIONS:  [name] is a  59 y.o. female with a diagnosis of Painful Infected Bilateral Above Knee Amputations who failed conservative measures and elected for surgical management.    The risks benefits and alternatives were discussed with the patient preoperatively including but not limited to the risks of infection, bleeding, nerve injury, cardiopulmonary complications, the need for revision surgery, among others, and the patient was willing to proceed.  OPERATIVE IMPLANTS: Praveena wound vacs x2  @ENCIMAGES @  OPERATIVE FINDINGS: Good petechial bleeding no abscess at the level of amputation.  OPERATIVE PROCEDURE: Patient was brought the operating room and underwent a general anesthetic.  After adequate levels anesthesia were obtained both lower extremities were prepped using DuraPrep draped into a sterile field a timeout was called.  Attention was first focused on the left above-knee rotation.  A fishmouth incision was made proximal to the necrotic tissue this was carried sharply down to bone a saw was used to resect the distal 3 cm of the femur.  2-0 silk ties were used for hemostasis as well as electrocautery.  The wound was irrigated with normal saline.  The deep and superficial fascia layers were closed using #1 Vicryl skin was closed using 2-0 nylon a Praveena customizable and Arthur form dressing were applied this had a good suction fit.  Attention was then focused on the right lower extremity.  A fishmouth incision was made just proximal to the necrotic gangrenous  tissue.  The distal 3 cm of the femur were resected.  Electrocautery was used hemostasis the vascular bundles were suture ligated with 2-0 silk.  Wound was irrigated with normal saline the deep and superficial fascia layers was closed using #1 Vicryl.  The skin was closed using 2-0 nylon.  The Cisco and Arnell Sieving form dressing were applied this had a good suction fit patient was extubated in stable condition   DISCHARGE PLANNING:  Antibiotic duration: Continue antibiotics for 24 hours  Weightbearing: Transfer training only no gait trainer  Pain medication: Opioid pathway  Dressing care/ Wound VAC: Leave wound vacs in place for 1 week  Ambulatory devices: Not applicable  Discharge to: Discharge to skilled nursing  Follow-up: In the office 1 week post operative.

## 2019-11-08 NOTE — Progress Notes (Signed)
Nutrition Follow-up  DOCUMENTATION CODES:   Non-severe (moderate) malnutrition in context of chronic illness  INTERVENTION:   -Continue Ensure Enlive po BID, each supplement provides 350 kcal and 20 grams of protein -Magic cup BID with meals, each supplement provides 290 kcal and 9 grams of protein -Continue MVI with minerals daily  NUTRITION DIAGNOSIS:   Moderate Malnutrition related to chronic illness(PAD) as evidenced by mild fat depletion, moderate fat depletion, mild muscle depletion, moderate muscle depletion.  Ongoing  GOAL:   Patient will meet greater than or equal to 90% of their needs  Progressing   MONITOR:   PO intake, Supplement acceptance, Labs, Weight trends, Skin, I & O's  REASON FOR ASSESSMENT:   Consult Assessment of nutrition requirement/status  ASSESSMENT:   59 y.o. female with medical history significant of bilateral AKA, cocaine induced vasculopathy, inflammatory arthritis, hypertension not taking any medication at home presented to the emergency department with complaints of severe pain, discharge from her right AKA stump.  She had presented on 10/31/2019 here in the emergency department with similar complaint and she was discharged on oral doxycycline which did not help.  Patient has history of chronic pain on bilateral stump but this time this time pain was worse on the right side.  Patient has also noticed numerous eruption on her skin and some of them look like infected.  She has history of levamisole-induced vasculitis.  She has long history of cocaine abuse.  She was admitted with similar complaint on 09/29/2019 with suspicion of sepsis but her blood cultures were negative.  She has poor wound healing with chronic  skin changes which are suspected secondary to cocaine use and levamisole-induced vasculitis.  She had bilateral AKA by Dr. Sharol Given for nonhealing wounds secondary to peripheral vascular disease.  She has been admitted several times for cellulitis  of the stump.  2/3- s/p REVISION BILATERAL ABOVE KNEE AMPUTATION with wound vac placement  Reviewed I/O's: -1.1 L x 24 hours and +3.8 L since admission  UOP: 2.3 L x 24 hours  Spoke with pt at bedside, who was very drowsy. Per discussion with nurse tech, pt just advanced to regular diet and had just returned from surgery. Pt did not answer any questions.   Noted bilateral wound vac to bilateral AKA stumps.   Prior to surgery, pt was consuming 100% of meals. Ensure supplements had already been ordered.   Medications reviewed and include colace, miralax, and dextrose 5% and 0.9% NaCl with KCl mEq/L infusion @ 75 ml/hr.   Labs reviewed: K: 2.9.   NUTRITION - FOCUSED PHYSICAL EXAM:    Most Recent Value  Orbital Region  Mild depletion  Upper Arm Region  Moderate depletion  Thoracic and Lumbar Region  Mild depletion  Buccal Region  No depletion  Temple Region  Mild depletion  Clavicle Bone Region  No depletion  Clavicle and Acromion Bone Region  No depletion  Scapular Bone Region  No depletion  Dorsal Hand  Mild depletion  Patellar Region  Unable to assess  Anterior Thigh Region  Unable to assess  Posterior Calf Region  Unable to assess  Edema (RD Assessment)  None  Hair  Reviewed  Eyes  Reviewed  Mouth  Reviewed  Skin  Reviewed  Nails  Reviewed       Diet Order:   Diet Order            Diet regular Room service appropriate? Yes; Fluid consistency: Thin  Diet effective now  EDUCATION NEEDS:   No education needs have been identified at this time  Skin:  Skin Assessment: Skin Integrity Issues: Skin Integrity Issues:: Other (Comment), Wound VAC Wound Vac: bilateral AKAs Other: bilateral AKAs  Last BM:  11/08/19  Height:   Ht Readings from Last 1 Encounters:  11/08/19 5' 1.5" (1.562 m)    Weight:   Wt Readings from Last 1 Encounters:  11/08/19 40.5 kg    Ideal Body Weight:  38.1 kg(adjusted for bilateral AKAs)  BMI:  Body mass index is  16.6 kg/m.  Estimated Nutritional Needs:   Kcal:  1450-1650  Protein:  70-85 grams  Fluid:  > 1.4 L    [name] A. Jimmye Norman, RD, LDN, [place] Registered Dietitian II Certified Diabetes Care and Education Specialist Pager: [phone] After hours Pager: [phone]

## 2019-11-08 NOTE — Anesthesia Procedure Notes (Signed)
Procedure Name: LMA Insertion Date/Time: 11/08/2019 9:28 AM Performed by: Janace Litten, CRNA Pre-anesthesia Checklist: Patient identified, Emergency Drugs available, Suction available and Patient being monitored Patient Re-evaluated:Patient Re-evaluated prior to induction Oxygen Delivery Method: Circle System Utilized Preoxygenation: Pre-oxygenation with 100% oxygen Induction Type: IV induction Ventilation: Mask ventilation without difficulty LMA: LMA inserted LMA Size: 3.0 Number of attempts: 1 Placement Confirmation: positive ETCO2 Tube secured with: Tape Dental Injury: Teeth and Oropharynx as per pre-operative assessment

## 2019-11-08 NOTE — Progress Notes (Signed)
PROGRESS NOTE    [name]  PQZ:[id number] DOB: Dec 29, 1960 DOA: 11/04/2019 PCP: Azzie Glatter, FNP    Brief Narrative:  59 year old female with history of crack cocaine use, peripheral vascular disease status post bilateral above-knee amputations, hypertension, migraine, chronic anemia presented to the emergency room on 10/31/2019 with pain and discharge from the right above-knee amputation, she was initially discharged on doxycycline, came back with more drainage and swelling and hence admitted to the hospital. In the emergency room lactic acid 3.1.  WBC count normal.  Drainage from right above-knee amputation stump.  Started on antibiotics and admitted to the hospital.  Assessment & Plan:   Principal Problem:   Sepsis ([place]) Active Problems:   Essential hypertension   Protein-calorie malnutrition, severe (HCC)   Dehiscence of amputation stump (HCC)   Malnutrition of moderate degree   Ulcer of right thigh, limited to breakdown of skin (HCC)   S/P bilateral above knee amputation (HCC)   Cellulitis   Stump pain ([place])  Infected bilateral above-knee amputation stump due to severe peripheral vascular disease: MRI negative for stroke. Underwent surgical revision bilateral above-knee amputation today, postop wound VAC placed. Continue antibiotics today. Will need discharge with wound VAC, will need to go to skilled nursing facility. Once wound is clean, will change to oral antibiotics.  Hypertension: Blood pressure stable.  Anemia of chronic disease: Hemoglobin is stable.  Will recheck tomorrow morning.  Hypokalemia: Persistent.  Replace aggressively.  Recheck levels tomorrow morning.  Also check magnesium phosphorus.   DVT prophylaxis: Lovenox subcu Code Status: Full code Family Communication: None Disposition Plan: patient is from boardinghouse. Anticipated DC to skilled nursing facility, Barriers to discharge surgery today   Consultants:   Orthopedics  Procedures:     Bilateral above-knee amputation revision and wound VAC placement, 2/3  Antimicrobials:  Anti-infectives (From admission, onward)   Start     Dose/Rate Route Frequency Ordered Stop   11/08/19 0830  ceFAZolin (ANCEF) IVPB 2g/100 mL premix  Status:  Discontinued     2 g 200 mL/hr over 30 Minutes Intravenous On call to O.R. 11/08/19 0828 11/08/19 1117   11/06/19 1200  vancomycin (VANCOREADY) IVPB 750 mg/150 mL     750 mg 150 mL/hr over 60 Minutes Intravenous Every 24 hours 11/06/19 1049     11/06/19 0932  vancomycin variable dose per unstable renal function (pharmacist dosing)  Status:  Discontinued      Does not apply See admin instructions 11/06/19 0932 11/06/19 1049   11/05/19 1000  vancomycin (VANCOREADY) IVPB 750 mg/150 mL  Status:  Discontinued     750 mg 150 mL/hr over 60 Minutes Intravenous Every 24 hours 11/04/19 1950 11/06/19 0932   11/05/19 0000  piperacillin-tazobactam (ZOSYN) IVPB 3.375 g     3.375 g 12.5 mL/hr over 240 Minutes Intravenous Every 8 hours 11/04/19 1857     11/04/19 1900  piperacillin-tazobactam (ZOSYN) IVPB 3.375 g     3.375 g 100 mL/hr over 30 Minutes Intravenous  Once 11/04/19 1857 11/04/19 2031   11/04/19 1600  vancomycin (VANCOCIN) IVPB 1000 mg/200 mL premix     1,000 mg 200 mL/hr over 60 Minutes Intravenous  Once 11/04/19 1552 11/04/19 1746   11/04/19 1600  ceFEPIme (MAXIPIME) 2 g in sodium chloride 0.9 % 100 mL IVPB     2 g 200 mL/hr over 30 Minutes Intravenous  Once 11/04/19 1552 11/04/19 1653         Subjective: Patient seen and examined before going to procedure.  She was unhappy because of being hungry.  Denied any leg pains.  Objective: Vitals:   11/08/19 1038 11/08/19 1045 11/08/19 1101 11/08/19 1106  BP: (!) 163/113 (!) 158/95 (!) 158/108 122/78  Pulse: 92 95 79 71  Resp: 20 18 20  (!) 23  Temp:    98.2 F (36.8 C)  TempSrc:      SpO2: 98% 96% 96% 93%  Weight:      Height:        Intake/Output Summary (Last 24 hours) at  11/08/2019 1123 Last data filed at 11/08/2019 1017 Gross per 24 hour  Intake 2050.49 ml  Output 2340 ml  Net -289.51 ml   Filed Weights   11/05/19 0500 11/08/19 0854  Weight: 40.5 kg 40.5 kg    Examination:  General exam: Appears calm and comfortable, chronically sick looking.  Not in any distress. Respiratory system: Clear to auscultation. Respiratory effort normal. Cardiovascular system: S1 & S2 heard, RRR.  Gastrointestinal system: Abdomen is nondistended, soft and nontender. Central nervous system: Alert and oriented. No focal neurological deficits. Extremities: Symmetric 5 x 5 power.  Bilateral above-knee amputation stumps, on dressing.  Not removed by me. Skin: No rashes, lesions or ulcers Psychiatry: Judgement and insight appear normal. Mood & affect flat.    Data Reviewed: I have personally reviewed following labs and imaging studies  CBC: Recent Labs  Lab 11/04/19 1407 11/05/19 0534 11/06/19 0436 11/07/19 0442 11/08/19 0338  WBC 8.2 11.5* 8.0 10.9* 11.4*  NEUTROABS 5.4  --  5.2 8.9* 9.0*  HGB 10.7* 10.2* 10.4* 10.5* 10.6*  HCT 35.2* 33.7* 35.4* 34.9* 33.7*  MCV 84.6 86.4 86.3 85.1 81.2  PLT 191 171 202 193 456   Basic Metabolic Panel: Recent Labs  Lab 11/04/19 1407 11/05/19 0534 11/06/19 0436 11/07/19 0442 11/08/19 0338  NA 132* 134* 137 137 136  K 4.7 4.3 3.7 3.2* 2.9*  CL 101 106 109 111 106  CO2 18* 18* 18* 19* 21*  GLUCOSE 98 115* 78 91 84  BUN 32* 32* 25* 18 9  CREATININE 0.94 0.86 1.08* 0.63 0.62  CALCIUM 8.7* 8.5* 7.9* 8.1* 7.8*   GFR: Estimated Creatinine Clearance: 50.2 mL/min (by C-G formula based on SCr of 0.62 mg/dL). Liver Function Tests: Recent Labs  Lab 11/06/19 0436 11/07/19 0442 11/08/19 0338  AST 221* 117* 64*  ALT 200* 156* 113*  ALKPHOS 98 90 85  BILITOT 3.5* 3.6* 3.5*  PROT 5.9* 6.1* 5.7*  ALBUMIN 2.6* 2.6* 2.2*   No results for input(s): LIPASE, AMYLASE in the last 168 hours. No results for input(s): AMMONIA in the  last 168 hours. Coagulation Profile: No results for input(s): INR, PROTIME in the last 168 hours. Cardiac Enzymes: No results for input(s): CKTOTAL, CKMB, CKMBINDEX, TROPONINI in the last 168 hours. BNP (last 3 results) No results for input(s): PROBNP in the last 8760 hours. HbA1C: No results for input(s): HGBA1C in the last 72 hours. CBG: No results for input(s): GLUCAP in the last 168 hours. Lipid Profile: No results for input(s): CHOL, HDL, LDLCALC, TRIG, CHOLHDL, LDLDIRECT in the last 72 hours. Thyroid Function Tests: No results for input(s): TSH, T4TOTAL, FREET4, T3FREE, THYROIDAB in the last 72 hours. Anemia Panel: No results for input(s): VITAMINB12, FOLATE, FERRITIN, TIBC, IRON, RETICCTPCT in the last 72 hours. Sepsis Labs: Recent Labs  Lab 11/04/19 1407 11/04/19 1607 11/05/19 0534  LATICACIDVEN 3.1* 3.6* 2.2*    Recent Results (from the past 240 hour(s))  Culture, blood (routine x 2)  Status: None (Preliminary result)   Collection Time: 11/04/19  3:46 PM   Specimen: BLOOD  Result Value Ref Range Status   Specimen Description   Final    BLOOD RIGHT ANTECUBITAL Performed at [facility], [place] [address], [place], [place] [address]    Special Requests   Final    BOTTLES DRAWN AEROBIC AND ANAEROBIC Blood Culture adequate volume Performed at [facility] [address] [place], [place] [address]    Culture   Final    NO GROWTH 4 DAYS Performed at [facility], [place] [address] [place], [place] [address]    Report Status PENDING  Incomplete  Culture, blood (routine x 2)     Status: None (Preliminary result)   Collection Time: 11/04/19  3:51 PM   Specimen: BLOOD LEFT ARM  Result Value Ref Range Status   Specimen Description   Final    BLOOD LEFT ARM Performed at [facility], [place] [address] [place], [place] [address]    Special Requests   Final    BOTTLES DRAWN AEROBIC AND ANAEROBIC Blood Culture adequate  volume Performed at [place] [address] [place], [place] [address]    Culture   Final    NO GROWTH 4 DAYS Performed at [facility], [place] [address], [place], [place] [address]    Report Status PENDING  Incomplete  SARS CORONAVIRUS 2 (TAT 6-24 HRS) Nasopharyngeal Nasopharyngeal Swab     Status: None   Collection Time: 11/04/19  4:40 PM   Specimen: Nasopharyngeal Swab  Result Value Ref Range Status   SARS Coronavirus 2 NEGATIVE NEGATIVE Final    Comment: (NOTE) SARS-CoV-2 target nucleic acids are NOT DETECTED. The SARS-CoV-2 RNA is generally detectable in upper and lower respiratory specimens during the acute phase of infection. Negative results do not preclude SARS-CoV-2 infection, do not rule out co-infections with other pathogens, and should not be used as the sole basis for treatment or other patient management decisions. Negative results must be combined with clinical observations, patient history, and epidemiological information. The expected result is Negative. Fact Sheet for Patients: SugarRoll.be Fact Sheet for Healthcare Providers: https://www.woods-mathews.com/ This test is not yet approved or cleared by the [place] FDA and  has been authorized for detection and/or diagnosis of SARS-CoV-2 by FDA under an Emergency Use Authorization (EUA). This EUA will remain  in effect (meaning this test can be used) for the duration of the COVID-19 declaration under Section 56 4(b)(1) of the Act, 21 U.S.C. section 360bbb-3(b)(1), unless the authorization is terminated or revoked sooner. Performed at [facility], [place] [address], [place], [place] [address]   MRSA PCR Screening     Status: Abnormal   Collection Time: 11/04/19  8:35 PM   Specimen: Nasopharyngeal  Result Value Ref Range Status   MRSA by PCR POSITIVE (A) NEGATIVE Final    Comment:        The GeneXpert MRSA Assay (FDA approved for  NASAL specimens only), is one component of a comprehensive MRSA colonization surveillance program. It is not intended to diagnose MRSA infection nor to guide or monitor treatment for MRSA infections. RESULT CALLED TO, READ BACK BY AND VERIFIED WITH: LINEBACK, RN ON 11/05/19 @ 0058 BY LE Performed at [facility] [address] [place], [place] [address]          Radiology Studies: No results found.      Scheduled Meds: .  amLODipine  10 mg Oral Daily  . Chlorhexidine Gluconate Cloth  6 each Topical Q0600  . enoxaparin (LOVENOX) injection  30 mg Subcutaneous Q24H  . feeding supplement (ENSURE ENLIVE)  237 mL Oral BID BM  . feeding supplement (PRO-STAT SUGAR FREE 64)  30 mL Oral BID  . labetalol      . losartan  25 mg Oral Daily  . mouth rinse  15 mL Mouth Rinse BID  . multivitamin with minerals  1 tablet Oral Daily  . mupirocin ointment  1 application Nasal BID  . polyethylene glycol  17 g Oral Daily  . potassium chloride  40 mEq Oral BID WC   Continuous Infusions: . dextrose 5 % and 0.9 % NaCl with KCl 20 mEq/L Stopped (11/07/19 1220)  . lactated ringers 10 mL/hr at 11/08/19 0903  . piperacillin-tazobactam (ZOSYN)  IV 3.375 g (11/08/19 8984)  . vancomycin Stopped (11/06/19 1616)     LOS: 4 days    Time spent: 25 minutes    Barb Merino, MD [facility] Pager [phone]

## 2019-11-08 NOTE — Anesthesia Postprocedure Evaluation (Signed)
Anesthesia Post Note  Patient: ELSE HABERMANN  Procedure(s) Performed: REVISION BILATERAL ABOVE KNEE AMPUTATION (Bilateral Leg Upper)     Patient location during evaluation: PACU Anesthesia Type: General Level of consciousness: awake and alert Pain management: pain level controlled Vital Signs Assessment: post-procedure vital signs reviewed and stable Respiratory status: spontaneous breathing, nonlabored ventilation, respiratory function stable and patient connected to nasal cannula oxygen Cardiovascular status: blood pressure returned to baseline and stable Postop Assessment: no apparent nausea or vomiting Anesthetic complications: no    Last Vitals:  Vitals:   11/08/19 1106 11/08/19 1148  BP: 122/78 (!) 135/96  Pulse: 71 76  Resp: (!) 23 17  Temp: 36.8 C 36.6 C  SpO2: 93% 94%    Last Pain:  Vitals:   11/08/19 1148  TempSrc: Oral  PainSc:                  Halima Fogal,W. EDMOND

## 2019-11-09 LAB — CULTURE, BLOOD (ROUTINE X 2)
Culture: NO GROWTH
Culture: NO GROWTH
Special Requests: ADEQUATE
Special Requests: ADEQUATE

## 2019-11-09 LAB — BASIC METABOLIC PANEL
Anion gap: 11 (ref 5–15)
BUN: 15 mg/dL (ref 6–20)
CO2: 22 mmol/L (ref 22–32)
Calcium: 8.2 mg/dL — ABNORMAL LOW (ref 8.9–10.3)
Chloride: 105 mmol/L (ref 98–111)
Creatinine, Ser: 0.85 mg/dL (ref 0.44–1.00)
GFR calc Af Amer: >60 mL/min (ref >60)
GFR calc non Af Amer: >60 mL/min (ref >60)
Glucose, Bld: 160 mg/dL — ABNORMAL HIGH (ref 70–99)
Potassium: 3.4 mmol/L — ABNORMAL LOW (ref 3.5–5.1)
Sodium: 138 mmol/L (ref 135–145)

## 2019-11-09 LAB — MAGNESIUM: Magnesium: 1.8 mg/dL (ref 1.7–2.4)

## 2019-11-09 LAB — PHOSPHORUS: Phosphorus: 2.9 mg/dL (ref 2.5–4.6)

## 2019-11-09 MED ORDER — DOXYCYCLINE HYCLATE 100 MG PO TABS
100.0000 mg | ORAL_TABLET | Freq: Two times a day (BID) | ORAL | Status: DC
  Administered 2019-11-09 – 2019-11-10 (×3): 100 mg via ORAL
  Filled 2019-11-09 (×3): qty 1

## 2019-11-09 MED ORDER — MAGNESIUM OXIDE 400 (241.3 MG) MG PO TABS
400.0000 mg | ORAL_TABLET | Freq: Two times a day (BID) | ORAL | Status: DC
  Administered 2019-11-09 – 2019-11-10 (×3): 400 mg via ORAL
  Filled 2019-11-09 (×3): qty 1

## 2019-11-09 NOTE — Evaluation (Signed)
Physical Therapy Evaluation Patient Details Name: [name] MRN: [id number] DOB: Aug 15, 1961 Today's Date: 11/09/2019   History of Present Illness  59 yo female admitted to ED 1/30 with bilateral residual limb pain secondary to cellulitis, s/p bilateral AKA revision on 2/3. PMH includes history of crack cocaine use, peripheral vascular disease status post bilateral above-knee amputations, hypertension, migraine, chronic anemia, RA.  Clinical Impression   Pt presents with moderate residual limb pain, generalized weakness, difficulty performing bed mobility, inability to transfer this session due to pain and fatigue, and decreased activity tolerance. Pt to benefit from acute PT to address deficits. Pt required min-mod assist for long sitting and rolling this session, unable to progress OOB this session due to pain and fatigue. PT recommending CIR consult, as pt is typically independent with mobility and PT feels this would maximize pt mobility post-acutely. PT to progress mobility as tolerated, and will continue to follow acutely.      Follow Up Recommendations CIR    Equipment Recommendations  None recommended by PT    Recommendations for Other Services       Precautions / Restrictions Precautions Precautions: Fall Precaution Comments: wound vacs Restrictions Weight Bearing Restrictions: Yes RLE Weight Bearing: Non weight bearing LLE Weight Bearing: Non weight bearing      Mobility  Bed Mobility Overal bed mobility: Needs Assistance Bed Mobility: Supine to Sit;Sit to Supine;Rolling Rolling: Mod assist   Supine to sit: Min assist;HOB elevated Sit to supine: Min assist;HOB elevated   General bed mobility comments: Min assist for pull to long sit for completion of trunk elevation, pt with use of bilateral handrails to pull to sit and tolerated sitting up ~30 seconds. Pt required mod assist for roll R for trunk and LE translation, assist with use of bed pad.  Transfers                  General transfer comment: pt declined - severe pain with mobility this session  Ambulation/Gait             General Gait Details: unable  Stairs            Wheelchair Mobility    Modified Rankin (Stroke Patients Only)       Balance Overall balance assessment: Needs assistance Sitting-balance support: Bilateral upper extremity supported Sitting balance-Leahy Scale: Fair Sitting balance - Comments: pull to sit observed, required min assist to come to sitting                                     Pertinent Vitals/Pain Pain Assessment: 0-10 Pain Score: 5  Pain Location: bilateal residual limbs Pain Descriptors / Indicators: Discomfort;Grimacing;Sore Pain Intervention(s): Limited activity within patient's tolerance;Monitored during session;Repositioned    Home Living Family/patient expects to be discharged to:: Other (Comment)(boarding home)                 Additional Comments: pt states she has a few steps to enter home, but she bumps up steps with w/c and assist of brother/boarding house employees.    Prior Function Level of Independence: Independent with assistive device(s)         Comments: Pt reports using w/c independently, performs AP transfers in and out of w/c without assist. Pt states she sponge bathes and her brother provides food for her.     Hand Dominance   Dominant Hand: Right    Extremity/Trunk Assessment  Upper Extremity Assessment Upper Extremity Assessment: Defer to OT evaluation    Lower Extremity Assessment Lower Extremity Assessment: Generalized weakness    Cervical / Trunk Assessment Cervical / Trunk Assessment: Normal  Communication   Communication: No difficulties  Cognition Arousal/Alertness: Awake/alert Behavior During Therapy: WFL for tasks assessed/performed Overall Cognitive Status: Impaired/Different from baseline Area of Impairment: Orientation;Following commands;Problem  solving                 Orientation Level: Disoriented to;Time;Situation     Following Commands: Follows one step commands consistently;Follows one step commands with increased time     Problem Solving: Slow processing;Difficulty sequencing;Requires verbal cues;Requires tactile cues General Comments: pt drowsy upon arrival to room, but wakes with PT entrance and discussion. Pt states "it's 2020 something" when asked the year, and does not recall procedure yesterday. Pt requires increased time to follow commands, and requires multimodal cuing to mobilize.      General Comments      Exercises     Assessment/Plan    PT Assessment Patient needs continued PT services  PT Problem List Decreased strength;Decreased mobility;Decreased safety awareness;Decreased activity tolerance;Decreased balance;Decreased cognition;Pain;Decreased knowledge of use of DME       PT Treatment Interventions DME instruction;Therapeutic activities;Therapeutic exercise;Patient/family education;Balance training;Neuromuscular re-education;Functional mobility training    PT Goals (Current goals can be found in the Care Plan section)  Acute Rehab PT Goals Patient Stated Goal: return to independence PT Goal Formulation: With patient Time For Goal Achievement: 11/23/19 Potential to Achieve Goals: Good    Frequency Min 3X/week   Barriers to discharge        Co-evaluation               AM-PAC PT "6 Clicks" Mobility  Outcome Measure Help needed turning from your back to your side while in a flat bed without using bedrails?: A Lot Help needed moving from lying on your back to sitting on the side of a flat bed without using bedrails?: A Lot Help needed moving to and from a bed to a chair (including a wheelchair)?: Total Help needed standing up from a chair using your arms (e.g., wheelchair or bedside chair)?: Total Help needed to walk in hospital room?: Total Help needed climbing 3-5 steps with a  railing? : Total 6 Click Score: 8    End of Session   Activity Tolerance: Patient limited by pain Patient left: in bed;with call bell/phone within reach;with bed alarm set   PT Visit Diagnosis: Other abnormalities of gait and mobility (R26.89);Muscle weakness (generalized) (M62.81);Pain Pain - Right/Left: (both) Pain - part of body: Leg    Time: 9574-7340 PT Time Calculation (min) (ACUTE ONLY): 13 min   Charges:   PT Evaluation $PT Eval Low Complexity: 1 Low         Andris Brothers E, PT Acute Rehabilitation Services Pager [phone]  Office [phone]   Teigan Sahli D [place] Ladson 11/09/2019, 12:04 PM

## 2019-11-09 NOTE — Progress Notes (Signed)
PROGRESS NOTE    [name]  IWL:[id number] DOB: 1961/06/23 DOA: 11/04/2019 PCP: Azzie Glatter, FNP    Brief Narrative:  59 year old female with history of crack cocaine use, peripheral vascular disease status post bilateral above-knee amputations, hypertension, migraine, chronic anemia presented to the emergency room on 10/31/2019 with pain and discharge from the right above-knee amputation, she was initially discharged on doxycycline, came back with more drainage and swelling and hence admitted to the hospital. In the emergency room lactic acid 3.1.  WBC count normal.  Drainage from right above-knee amputation stump.  Started on antibiotics and admitted to the hospital. 2/4. undersent revision of b/l AKA  Assessment & Plan:   Principal Problem:   Sepsis ([place]) Active Problems:   Essential hypertension   Protein-calorie malnutrition, severe ([place])   Dehiscence of amputation stump ([facility])   Malnutrition of moderate degree   Ulcer of right thigh, limited to breakdown of skin ([facility])   S/P bilateral above knee amputation ([facility])   Cellulitis   Stump pain ([place])  Infected bilateral above-knee amputation stump due to severe peripheral vascular disease: MRI negative for osteo. Underwent surgical revision bilateral above-knee amputation 2/4, postop wound VAC placed. Was treated with broad-spectrum antibiotics.  Changed to oral doxycycline for 7 days. Will need discharge with wound VAC, will need to go to skilled nursing facility.  Hypertension: Blood pressure stable.  Anemia of chronic disease: Hemoglobin is stable.    Hypokalemia: Persistent.  On the schedule replacement with potassium and magnesium.   Nutrition Status: Nutrition Problem: Moderate Malnutrition Etiology: chronic illness(PAD) Signs/Symptoms: mild fat depletion, moderate fat depletion, mild muscle depletion, moderate muscle depletion Interventions: Ensure Enlive (each supplement provides 350kcal and 20 grams of  protein), MVI, Magic cup    DVT prophylaxis: Lovenox subcu Code Status: Full code Family Communication: None Disposition Plan: patient is from boardinghouse. Anticipated DC to skilled nursing facility, Barriers to discharge skilled nursing facility availability.   Consultants:   Orthopedics  Procedures:   Bilateral above-knee amputation revision and wound VAC placement, 2/3  Antimicrobials:  Anti-infectives (From admission, onward)   Start     Dose/Rate Route Frequency Ordered Stop   11/09/19 1000  doxycycline (VIBRA-TABS) tablet 100 mg     100 mg Oral Every 12 hours 11/09/19 0950 11/16/19 0959   11/08/19 0830  ceFAZolin (ANCEF) IVPB 2g/100 mL premix  Status:  Discontinued     2 g 200 mL/hr over 30 Minutes Intravenous On call to O.R. 11/08/19 0828 11/08/19 1117   11/06/19 1200  vancomycin (VANCOREADY) IVPB 750 mg/150 mL  Status:  Discontinued     750 mg 150 mL/hr over 60 Minutes Intravenous Every 24 hours 11/06/19 1049 11/09/19 0949   11/06/19 0932  vancomycin variable dose per unstable renal function (pharmacist dosing)  Status:  Discontinued      Does not apply See admin instructions 11/06/19 0932 11/06/19 1049   11/05/19 1000  vancomycin (VANCOREADY) IVPB 750 mg/150 mL  Status:  Discontinued     750 mg 150 mL/hr over 60 Minutes Intravenous Every 24 hours 11/04/19 1950 11/06/19 0932   11/05/19 0000  piperacillin-tazobactam (ZOSYN) IVPB 3.375 g  Status:  Discontinued     3.375 g 12.5 mL/hr over 240 Minutes Intravenous Every 8 hours 11/04/19 1857 11/09/19 0949   11/04/19 1900  piperacillin-tazobactam (ZOSYN) IVPB 3.375 g     3.375 g 100 mL/hr over 30 Minutes Intravenous  Once 11/04/19 1857 11/04/19 2031   11/04/19 1600  vancomycin (VANCOCIN) IVPB 1000  mg/200 mL premix     1,000 mg 200 mL/hr over 60 Minutes Intravenous  Once 11/04/19 1552 11/04/19 1746   11/04/19 1600  ceFEPIme (MAXIPIME) 2 g in sodium chloride 0.9 % 100 mL IVPB     2 g 200 mL/hr over 30 Minutes Intravenous   Once 11/04/19 1552 11/04/19 1653         Subjective: Patient seen and examined.  No overnight events.  Pain managed with oral pain medications.  Objective: Vitals:   11/08/19 1423 11/08/19 2026 11/09/19 0413 11/09/19 0746  BP: (!) 148/84 103/80 106/62 119/78  Pulse: 85 71 84 82  Resp: 15 20 20 17   Temp: 98.8 F (37.1 C) 98.2 F (36.8 C) 98.7 F (37.1 C) 98.4 F (36.9 C)  TempSrc: Oral Oral Oral Oral  SpO2: (!) 84% 92% 94% 92%  Weight:      Height:        Intake/Output Summary (Last 24 hours) at 11/09/2019 1027 Last data filed at 11/09/2019 0300 Gross per 24 hour  Intake 783.37 ml  Output 800 ml  Net -16.63 ml   Filed Weights   11/05/19 0500 11/08/19 0854  Weight: 40.5 kg 40.5 kg    Examination:  General exam: Appears calm and comfortable, chronically sick looking.  Not in any distress. Respiratory system: Clear to auscultation. Respiratory effort normal. Cardiovascular system: S1 & S2 heard, RRR.  Gastrointestinal system: Abdomen is nondistended, soft and nontender. Central nervous system: Alert and oriented. No focal neurological deficits. Extremities: Symmetric 5 x 5 power.  Bilateral above-knee amputation stumps with wound VAC, nontender, no drainage in the wound VAC. Skin: No rashes, lesions or ulcers Psychiatry: Judgement and insight appear normal. Mood & affect flat.    Data Reviewed: I have personally reviewed following labs and imaging studies  CBC: Recent Labs  Lab 11/04/19 1407 11/05/19 0534 11/06/19 0436 11/07/19 0442 11/08/19 0338  WBC 8.2 11.5* 8.0 10.9* 11.4*  NEUTROABS 5.4  --  5.2 8.9* 9.0*  HGB 10.7* 10.2* 10.4* 10.5* 10.6*  HCT 35.2* 33.7* 35.4* 34.9* 33.7*  MCV 84.6 86.4 86.3 85.1 81.2  PLT 191 171 202 193 270   Basic Metabolic Panel: Recent Labs  Lab 11/05/19 0534 11/06/19 0436 11/07/19 0442 11/08/19 0338 11/09/19 0712  NA 134* 137 137 136 138  K 4.3 3.7 3.2* 2.9* 3.4*  CL 106 109 111 106 105  CO2 18* 18* 19* 21* 22    GLUCOSE 115* 78 91 84 160*  BUN 32* 25* 18 9 15   CREATININE 0.86 1.08* 0.63 0.62 0.85  CALCIUM 8.5* 7.9* 8.1* 7.8* 8.2*  MG  --   --   --  1.4* 1.8  PHOS  --   --   --  2.1* 2.9   GFR: Estimated Creatinine Clearance: 47.3 mL/min (by C-G formula based on SCr of 0.85 mg/dL). Liver Function Tests: Recent Labs  Lab 11/06/19 0436 11/07/19 0442 11/08/19 0338  AST 221* 117* 64*  ALT 200* 156* 113*  ALKPHOS 98 90 85  BILITOT 3.5* 3.6* 3.5*  PROT 5.9* 6.1* 5.7*  ALBUMIN 2.6* 2.6* 2.2*   No results for input(s): LIPASE, AMYLASE in the last 168 hours. No results for input(s): AMMONIA in the last 168 hours. Coagulation Profile: No results for input(s): INR, PROTIME in the last 168 hours. Cardiac Enzymes: No results for input(s): CKTOTAL, CKMB, CKMBINDEX, TROPONINI in the last 168 hours. BNP (last 3 results) No results for input(s): PROBNP in the last 8760 hours. HbA1C: No results  for input(s): HGBA1C in the last 72 hours. CBG: No results for input(s): GLUCAP in the last 168 hours. Lipid Profile: No results for input(s): CHOL, HDL, LDLCALC, TRIG, CHOLHDL, LDLDIRECT in the last 72 hours. Thyroid Function Tests: No results for input(s): TSH, T4TOTAL, FREET4, T3FREE, THYROIDAB in the last 72 hours. Anemia Panel: No results for input(s): VITAMINB12, FOLATE, FERRITIN, TIBC, IRON, RETICCTPCT in the last 72 hours. Sepsis Labs: Recent Labs  Lab 11/04/19 1407 11/04/19 1607 11/05/19 0534  LATICACIDVEN 3.1* 3.6* 2.2*    Recent Results (from the past 240 hour(s))  Culture, blood (routine x 2)     Status: None (Preliminary result)   Collection Time: 11/04/19  3:46 PM   Specimen: BLOOD  Result Value Ref Range Status   Specimen Description   Final    BLOOD RIGHT ANTECUBITAL Performed at [facility], [place] [address], [place], [place] [address]    Special Requests   Final    BOTTLES DRAWN AEROBIC AND ANAEROBIC Blood Culture adequate volume Performed at [facility] [address], [place], [place] [address]    Culture   Final    NO GROWTH 4 DAYS Performed at [facility], [place] [address], [place], [place] [address]    Report Status PENDING  Incomplete  Culture, blood (routine x 2)     Status: None (Preliminary result)   Collection Time: 11/04/19  3:51 PM   Specimen: BLOOD LEFT ARM  Result Value Ref Range Status   Specimen Description   Final    BLOOD LEFT ARM Performed at [facility], [place] [address], [place], [place] [address]    Special Requests   Final    BOTTLES DRAWN AEROBIC AND ANAEROBIC Blood Culture adequate volume Performed at [facility] [address], [place], [place] [address]    Culture   Final    NO GROWTH 4 DAYS Performed at [facility], [place] [address], [place], [place] [address]    Report Status PENDING  Incomplete  SARS CORONAVIRUS 2 (TAT 6-24 HRS) Nasopharyngeal Nasopharyngeal Swab     Status: None   Collection Time: 11/04/19  4:40 PM   Specimen: Nasopharyngeal Swab  Result Value Ref Range Status   SARS Coronavirus 2 NEGATIVE NEGATIVE Final    Comment: (NOTE) SARS-CoV-2 target nucleic acids are NOT DETECTED. The SARS-CoV-2 RNA is generally detectable in upper and lower respiratory specimens during the acute phase of infection. Negative results do not preclude SARS-CoV-2 infection, do not rule out co-infections with other pathogens, and should not be used as the sole basis for treatment or other patient management decisions. Negative results must be combined with clinical observations, patient history, and epidemiological information. The expected result is Negative. Fact Sheet for Patients: SugarRoll.be Fact Sheet for Healthcare Providers: https://www.woods-mathews.com/ This test is not yet approved or cleared by the [place] FDA and  has been authorized for detection and/or diagnosis of SARS-CoV-2 by FDA under an  Emergency Use Authorization (EUA). This EUA will remain  in effect (meaning this test can be used) for the duration of the COVID-19 declaration under Section 56 4(b)(1) of the Act, 21 U.S.C. section 360bbb-3(b)(1), unless the authorization is terminated or revoked sooner. Performed at [facility], [place] [address], [place], [place] [address]   MRSA PCR Screening     Status: Abnormal   Collection Time: 11/04/19  8:35 PM   Specimen: Nasopharyngeal  Result Value Ref Range Status  MRSA by PCR POSITIVE (A) NEGATIVE Final    Comment:        The GeneXpert MRSA Assay (FDA approved for NASAL specimens only), is one component of a comprehensive MRSA colonization surveillance program. It is not intended to diagnose MRSA infection nor to guide or monitor treatment for MRSA infections. RESULT CALLED TO, READ BACK BY AND VERIFIED WITH: LINEBACK, RN ON 11/05/19 @ 0058 BY LE Performed at [facility] [address] [place], [place] [address]          Radiology Studies: No results found.      Scheduled Meds: . amLODipine  10 mg Oral Daily  . Chlorhexidine Gluconate Cloth  6 each Topical Q0600  . docusate sodium  100 mg Oral BID  . doxycycline  100 mg Oral Q12H  . enoxaparin (LOVENOX) injection  30 mg Subcutaneous Q24H  . feeding supplement (ENSURE ENLIVE)  237 mL Oral BID BM  . losartan  25 mg Oral Daily  . magnesium oxide  400 mg Oral BID  . mouth rinse  15 mL Mouth Rinse BID  . multivitamin with minerals  1 tablet Oral Daily  . mupirocin ointment  1 application Nasal BID  . polyethylene glycol  17 g Oral Daily  . potassium chloride  40 mEq Oral BID WC   Continuous Infusions: . lactated ringers 10 mL/hr at 11/08/19 0903     LOS: 5 days    Time spent: 25 minutes    Barb Merino, MD [facility] Pager [phone]

## 2019-11-09 NOTE — Evaluation (Signed)
Occupational Therapy Evaluation Patient Details Name: [name] MRN: [id number] DOB: 04/19/1961 Today's Date: 11/09/2019    History of Present Illness 59 yo female admitted to ED 1/30 with bilateral residual limb pain secondary to cellulitis, s/p bilateral AKA revision on 2/3. PMH includes history of crack cocaine use, peripheral vascular disease status post bilateral above-knee amputations, hypertension, migraine, chronic anemia, RA.   Clinical Impression   Pt with decline in function and safety with ADLs and ADL mobility with impaired strength, balance, endurance and cognition. Pt limited by pain and unable to sit EOB. Pt reports that she lives at a boarding house and was independent from w/c level with ADLs/selcare, w/c mobility and transfers by AP method. Pt currently requires extensive assist with ADLs at bed level and mod A for rolling, min A to long sit using rails, however pt only sat up halfway before lying back down due to pain. Pt would benefit from acute OT services to address impairments to maximize level of function and safety    Follow Up Recommendations  CIR    Equipment Recommendations  Other (comment)(TBD at next venue of care)    Recommendations for Other Services Rehab consult     Precautions / Restrictions Precautions Precautions: Fall Precaution Comments: wound vacs Restrictions Weight Bearing Restrictions: Yes RLE Weight Bearing: Non weight bearing LLE Weight Bearing: Non weight bearing      Mobility Bed Mobility Overal bed mobility: Needs Assistance Bed Mobility: Supine to Sit;Sit to Supine;Rolling Rolling: Mod assist       General bed mobility comments: min A to long sit in bed using rails, however pt long sat up about half way and was unable to complete due to pain  Transfers                 General transfer comment: pt declined - severe pain with mobility this session    Balance Overall balance assessment: Needs  assistance Sitting-balance support: Bilateral upper extremity supported Sitting balance-Leahy Scale: Fair Sitting balance - Comments: unable to assess due to pain                                   ADL either performed or assessed with clinical judgement   ADL Overall ADL's : Needs assistance/impaired Eating/Feeding: Set up;Sitting;Bed level Eating/Feeding Details (indicate cue type and reason): HOB raised Grooming: Wash/dry hands;Wash/dry face;Set up;Bed level   Upper Body Bathing: Moderate assistance;Bed level   Lower Body Bathing: Maximal assistance;Bed level   Upper Body Dressing : Moderate assistance;Bed level   Lower Body Dressing: Total assistance;Bed level       Toileting- Clothing Manipulation and Hygiene: Total assistance;Bed level         General ADL Comments: unabel to complete long sitting in bed     Vision Patient Visual Report: No change from baseline       Perception     Praxis      Pertinent Vitals/Pain Pain Assessment: 0-10 Pain Score: 6  Pain Location: B residual limbs Pain Descriptors / Indicators: Discomfort;Grimacing;Sore Pain Intervention(s): Limited activity within patient's tolerance;Monitored during session;Repositioned     Hand Dominance Right   Extremity/Trunk Assessment Upper Extremity Assessment Upper Extremity Assessment: Generalized weakness   Lower Extremity Assessment Lower Extremity Assessment: Defer to PT evaluation   Cervical / Trunk Assessment Cervical / Trunk Assessment: Normal   Communication Communication Communication: No difficulties   Cognition Arousal/Alertness: Awake/alert Behavior During  Therapy: WFL for tasks assessed/performed Overall Cognitive Status: Impaired/Different from baseline Area of Impairment: Orientation;Following commands;Problem solving                 Orientation Level: Disoriented to;Time;Situation     Following Commands: Follows one step commands  consistently;Follows one step commands with increased time     Problem Solving: Slow processing;Difficulty sequencing;Requires verbal cues;Requires tactile cues General Comments: pt drowsy upon arrival to room, but wakes with PT entrance and discussion. Pt states "it's 2020 something" when asked the year, and does not recall procedure yesterday. Pt requires increased time to follow commands, and requires multimodal cuing to mobilize.   General Comments       Exercises     Shoulder Instructions      Home Living Family/patient expects to be discharged to:: Other (Comment)(boarding house)                                 Additional Comments: pt states she has a few steps to enter home, but she bumps up steps with w/c and assist of brother/boarding house employees.      Prior Functioning/Environment Level of Independence: Independent with assistive device(s)    ADL's / Homemaking Assistance Needed: reports that she is independent with bathing (sponge bathes), dressing and toileting. Says she eats take out for most meals   Comments: Pt reports using w/c independently, performs AP transfers in and out of w/c without assist. Pt states she sponge bathes and her brother provides food for her.        OT Problem List: Decreased strength;Impaired balance (sitting and/or standing);Pain;Decreased activity tolerance;Decreased knowledge of use of DME or AE      OT Treatment/Interventions: Self-care/ADL training;DME and/or AE instruction;Therapeutic activities;Balance training;Therapeutic exercise;Patient/family education    OT Goals(Current goals can be found in the care plan section) Acute Rehab OT Goals Patient Stated Goal: return to independence OT Goal Formulation: With patient Time For Goal Achievement: 11/23/19 Potential to Achieve Goals: Good ADL Goals Pt Will Perform Grooming: with min assist;with min guard assist;sitting Pt Will Perform Upper Body Bathing: with min  assist;sitting Pt Will Perform Lower Body Bathing: with mod assist;sitting/lateral leans Pt Will Perform Upper Body Dressing: with min assist;sitting Pt Will Transfer to Toilet: with max assist;with mod assist;anterior/posterior transfer;bedside commode  OT Frequency: Min 2X/week   Barriers to D/C:            Co-evaluation              AM-PAC OT "6 Clicks" Daily Activity     Outcome Measure Help from another person eating meals?: None Help from another person taking care of personal grooming?: A Little Help from another person toileting, which includes using toliet, bedpan, or urinal?: Total Help from another person bathing (including washing, rinsing, drying)?: Total Help from another person to put on and taking off regular upper body clothing?: A Lot Help from another person to put on and taking off regular lower body clothing?: Total 6 Click Score: 12   End of Session    Activity Tolerance: Patient limited by pain Patient left: in bed;with call bell/phone within reach;with bed alarm set  OT Visit Diagnosis: Other abnormalities of gait and mobility (R26.89);Muscle weakness (generalized) (M62.81);Pain Pain - Right/Left: (bilaterally) Pain - part of body: Leg(residual limbs)                Time: 1041-1100 OT Time Calculation (min):  19 min Charges:  OT General Charges $OT Visit: 1 Visit OT Evaluation $OT Eval Moderate Complexity: 1 Mod    Britt Bottom 11/09/2019, 1:16 PM

## 2019-11-09 NOTE — Progress Notes (Signed)
Rehab Admissions Coordinator Note:  Patient was screened by Cleatrice Burke for appropriateness for an Inpatient Acute Rehab Consult per PT recs. At this time, we are recommending Inpatient Rehab consult. I will place order per protocol.  Cleatrice Burke RN MSN 11/09/2019, 12:20 PM  I can be reached at [phone].

## 2019-11-09 NOTE — Plan of Care (Signed)
  Problem: Pain Managment: Goal: General experience of comfort will improve Outcome: Progressing   Problem: Safety: Goal: Ability to remain free from injury will improve Outcome: Progressing   

## 2019-11-09 NOTE — Progress Notes (Signed)
Pharmacy Antibiotic Note  [name] is a 59 y.o. female with bilateral AKA admitted on 11/04/2019 with stump infection.  Pharmacy has been consulted for vancomycin and Zosyn dosing. Patient is now s/p Bilateral AKA debridement. D/W primary MD who plans to continue patient on PO abx for 7 days post op. Will start doxycycline.    Plan:  D/C vancomycin and zosyn  Start Doxycycline 168m PO BID x 7 days per MD   Height: 5' 1.5" (156.2 cm) Weight: 89 lb 4.6 oz (40.5 kg) IBW/kg (Calculated) : 48.95  Temp (24hrs), Avg:98.3 F (36.8 C), Min:97.8 F (36.6 C), Max:98.8 F (37.1 C)  Recent Labs  Lab 11/04/19 1407 11/04/19 1407 11/04/19 1607 11/05/19 0534 11/06/19 0436 11/06/19 1007 11/07/19 0442 11/08/19 0338 11/09/19 0712  WBC 8.2  --   --  11.5* 8.0  --  10.9* 11.4*  --   CREATININE 0.94   < >  --  0.86 1.08*  --  0.63 0.62 0.85  LATICACIDVEN 3.1*  --  3.6* 2.2*  --   --   --   --   --   VANCORANDOM  --   --   --   --   --  11  --   --   --    < > = values in this interval not displayed.    Estimated Creatinine Clearance: 47.3 mL/min (by C-G formula based on SCr of 0.85 mg/dL).    Allergies  Allergen Reactions  . Levamisole     Levamisole is a chemical adulterant of cocaine which can cause cutaneous necrosis It is used as an aHealth and safety inspectorin veterinary medicine  . Acetaminophen Swelling and Other (See Comments)    "Eyelid swelling" but apparently tolerates Percocet and Vicodin just fine...  . Haldol [Haloperidol Lactate]     Possible eye swelling was given together with Toradol unsure if caused the reaction  . Lisinopril     UNSPECIFIED REACTION   . Toradol [Ketorolac Tromethamine]     Eye swelling  . Tramadol Rash    Kynzli Rease A. PLevada Dy PharmD, BCPS, FNKF Clinical Pharmacist [place] Please utilize Amion for appropriate phone number to reach the unit pharmacist ([place]   11/09/2019 9:44 AM

## 2019-11-09 NOTE — Progress Notes (Addendum)
Inpatient Rehab Admissions:  Inpatient Rehab Consult received.  I met with patient at the bedside for rehabilitation assessment and to discuss goals and expectations of an inpatient rehab admission.  She is open to CIR, if needed.  She does not recall being on CIR after her bilat BKA in 2018.  She was independent prior to admission (already with bilat AKA) and may progress quickly once pain controlled.  She is not currently using IV pain medication, but restless with pain when I saw her. Will follow for progress.   Signed: Shann Medal, PT, DPT Admissions Coordinator [phone] 11/09/19  1:35 PM

## 2019-11-09 NOTE — Progress Notes (Signed)
POS 1 s/p B AKA debridement. Fairly comfortable, rubbing on vac dressings  vacs x2 in place and functioning. 0cc in cannisters.   Orthopedic stable. DC to SNF? When medically appropriate. Follow up Dr. Sharol Given 1 week for wound vac removals and wound check

## 2019-11-10 ENCOUNTER — Inpatient Hospital Stay (HOSPITAL_COMMUNITY)
Admission: RE | Admit: 2019-11-10 | Discharge: 2019-11-21 | DRG: 559 | Disposition: A | Discharge status: Home Health | Payer: Medicaid Other | Source: Intra-hospital | Attending: Physical Medicine & Rehabilitation | Admitting: Physical Medicine & Rehabilitation

## 2019-11-10 ENCOUNTER — Other Ambulatory Visit: Payer: Self-pay

## 2019-11-10 ENCOUNTER — Encounter (HOSPITAL_COMMUNITY): Payer: Self-pay | Admitting: Internal Medicine

## 2019-11-10 DIAGNOSIS — Z89611 Acquired absence of right leg above knee: Secondary | ICD-10-CM | POA: Diagnosis not present

## 2019-11-10 DIAGNOSIS — E876 Hypokalemia: Secondary | ICD-10-CM

## 2019-11-10 DIAGNOSIS — R509 Fever, unspecified: Secondary | ICD-10-CM

## 2019-11-10 DIAGNOSIS — I10 Essential (primary) hypertension: Secondary | ICD-10-CM

## 2019-11-10 DIAGNOSIS — F332 Major depressive disorder, recurrent severe without psychotic features: Secondary | ICD-10-CM | POA: Diagnosis present

## 2019-11-10 DIAGNOSIS — Z9119 Patient's noncompliance with other medical treatment and regimen: Secondary | ICD-10-CM

## 2019-11-10 DIAGNOSIS — F14988 Cocaine use, unspecified with other cocaine-induced disorder: Secondary | ICD-10-CM | POA: Diagnosis not present

## 2019-11-10 DIAGNOSIS — T148XXA Other injury of unspecified body region, initial encounter: Secondary | ICD-10-CM

## 2019-11-10 DIAGNOSIS — F1410 Cocaine abuse, uncomplicated: Secondary | ICD-10-CM | POA: Diagnosis present

## 2019-11-10 DIAGNOSIS — D638 Anemia in other chronic diseases classified elsewhere: Secondary | ICD-10-CM | POA: Diagnosis present

## 2019-11-10 DIAGNOSIS — E46 Unspecified protein-calorie malnutrition: Secondary | ICD-10-CM

## 2019-11-10 DIAGNOSIS — M069 Rheumatoid arthritis, unspecified: Secondary | ICD-10-CM | POA: Diagnosis present

## 2019-11-10 DIAGNOSIS — G8929 Other chronic pain: Secondary | ICD-10-CM | POA: Diagnosis present

## 2019-11-10 DIAGNOSIS — F17210 Nicotine dependence, cigarettes, uncomplicated: Secondary | ICD-10-CM | POA: Diagnosis present

## 2019-11-10 DIAGNOSIS — Z89612 Acquired absence of left leg above knee: Secondary | ICD-10-CM

## 2019-11-10 DIAGNOSIS — R7401 Elevation of levels of liver transaminase levels: Secondary | ICD-10-CM

## 2019-11-10 DIAGNOSIS — E871 Hypo-osmolality and hyponatremia: Secondary | ICD-10-CM

## 2019-11-10 DIAGNOSIS — F1420 Cocaine dependence, uncomplicated: Secondary | ICD-10-CM | POA: Diagnosis not present

## 2019-11-10 DIAGNOSIS — G894 Chronic pain syndrome: Secondary | ICD-10-CM

## 2019-11-10 DIAGNOSIS — D72829 Elevated white blood cell count, unspecified: Secondary | ICD-10-CM

## 2019-11-10 DIAGNOSIS — N179 Acute kidney failure, unspecified: Secondary | ICD-10-CM | POA: Diagnosis present

## 2019-11-10 DIAGNOSIS — Z4781 Encounter for orthopedic aftercare following surgical amputation: Principal | ICD-10-CM

## 2019-11-10 DIAGNOSIS — D62 Acute posthemorrhagic anemia: Secondary | ICD-10-CM | POA: Diagnosis present

## 2019-11-10 DIAGNOSIS — M79609 Pain in unspecified limb: Secondary | ICD-10-CM

## 2019-11-10 DIAGNOSIS — R5381 Other malaise: Secondary | ICD-10-CM | POA: Diagnosis present

## 2019-11-10 DIAGNOSIS — G546 Phantom limb syndrome with pain: Secondary | ICD-10-CM | POA: Diagnosis present

## 2019-11-10 DIAGNOSIS — I999 Unspecified disorder of circulatory system: Secondary | ICD-10-CM | POA: Diagnosis not present

## 2019-11-10 DIAGNOSIS — T8789 Other complications of amputation stump: Secondary | ICD-10-CM

## 2019-11-10 DIAGNOSIS — Z79899 Other long term (current) drug therapy: Secondary | ICD-10-CM | POA: Diagnosis not present

## 2019-11-10 DIAGNOSIS — E8809 Other disorders of plasma-protein metabolism, not elsewhere classified: Secondary | ICD-10-CM

## 2019-11-10 DIAGNOSIS — L089 Local infection of the skin and subcutaneous tissue, unspecified: Secondary | ICD-10-CM

## 2019-11-10 DIAGNOSIS — E43 Unspecified severe protein-calorie malnutrition: Secondary | ICD-10-CM | POA: Diagnosis present

## 2019-11-10 MED ORDER — DIPHENHYDRAMINE HCL 12.5 MG/5ML PO ELIX
12.5000 mg | ORAL_SOLUTION | Freq: Four times a day (QID) | ORAL | Status: DC | PRN
  Administered 2019-11-18: 12.5 mg via ORAL
  Administered 2019-11-20: 25 mg via ORAL
  Filled 2019-11-10 (×2): qty 10

## 2019-11-10 MED ORDER — ENOXAPARIN SODIUM 30 MG/0.3ML ~~LOC~~ SOLN
30.0000 mg | SUBCUTANEOUS | Status: DC
  Administered 2019-11-10 – 2019-11-20 (×11): 30 mg via SUBCUTANEOUS
  Filled 2019-11-10 (×11): qty 0.3

## 2019-11-10 MED ORDER — POTASSIUM CHLORIDE CRYS ER 20 MEQ PO TBCR
20.0000 meq | EXTENDED_RELEASE_TABLET | Freq: Two times a day (BID) | ORAL | Status: DC
  Administered 2019-11-10: 20 meq via ORAL
  Filled 2019-11-10: qty 1

## 2019-11-10 MED ORDER — FLEET ENEMA 7-19 GM/118ML RE ENEM: 1.0000 | ENEMA | Freq: Once | RECTAL | Status: DC | PRN

## 2019-11-10 MED ORDER — DIPHENHYDRAMINE HCL 25 MG PO CAPS
25.0000 mg | ORAL_CAPSULE | Freq: Four times a day (QID) | ORAL | Status: DC | PRN
  Administered 2019-11-12: 25 mg via ORAL
  Filled 2019-11-10: qty 1

## 2019-11-10 MED ORDER — ORAL CARE MOUTH RINSE
15.0000 mL | Freq: Two times a day (BID) | OROMUCOSAL | Status: DC
  Administered 2019-11-10 – 2019-11-21 (×13): 15 mL via OROMUCOSAL

## 2019-11-10 MED ORDER — OXYCODONE HCL 5 MG PO TABS: 5.0000 mg | ORAL_TABLET | ORAL | 0 refills | Status: DC | PRN

## 2019-11-10 MED ORDER — LOSARTAN POTASSIUM 25 MG PO TABS: 25.0000 mg | ORAL_TABLET | Freq: Every day | ORAL | Status: DC

## 2019-11-10 MED ORDER — ENSURE ENLIVE PO LIQD
237.0000 mL | Freq: Two times a day (BID) | ORAL | Status: DC
  Administered 2019-11-11 – 2019-11-21 (×12): 237 mL via ORAL

## 2019-11-10 MED ORDER — PROCHLORPERAZINE EDISYLATE 10 MG/2ML IJ SOLN: 5.0000 mg | Freq: Four times a day (QID) | INTRAMUSCULAR | Status: DC | PRN

## 2019-11-10 MED ORDER — POTASSIUM CHLORIDE CRYS ER 20 MEQ PO TBCR: 20.0000 meq | EXTENDED_RELEASE_TABLET | Freq: Every day | ORAL | Status: DC

## 2019-11-10 MED ORDER — PROCHLORPERAZINE MALEATE 5 MG PO TABS
5.0000 mg | ORAL_TABLET | Freq: Four times a day (QID) | ORAL | Status: DC | PRN
  Administered 2019-11-12: 10 mg via ORAL
  Administered 2019-11-16: 5 mg via ORAL
  Filled 2019-11-10: qty 1
  Filled 2019-11-10: qty 2

## 2019-11-10 MED ORDER — POLYETHYLENE GLYCOL 3350 17 G PO PACK
17.0000 g | PACK | Freq: Every day | ORAL | Status: DC
  Administered 2019-11-11 – 2019-11-20 (×7): 17 g via ORAL
  Filled 2019-11-10 (×12): qty 1

## 2019-11-10 MED ORDER — DOXYCYCLINE HYCLATE 100 MG PO TABS: 100.0000 mg | ORAL_TABLET | Freq: Two times a day (BID) | ORAL | 0 refills | Status: DC

## 2019-11-10 MED ORDER — TRAZODONE HCL 50 MG PO TABS
25.0000 mg | ORAL_TABLET | Freq: Every evening | ORAL | Status: DC | PRN
  Administered 2019-11-16: 25 mg via ORAL
  Administered 2019-11-18 – 2019-11-20 (×4): 50 mg via ORAL
  Filled 2019-11-10 (×5): qty 1

## 2019-11-10 MED ORDER — MAGNESIUM OXIDE 400 (241.3 MG) MG PO TABS
400.0000 mg | ORAL_TABLET | Freq: Two times a day (BID) | ORAL | Status: DC
  Administered 2019-11-10 – 2019-11-21 (×22): 400 mg via ORAL
  Filled 2019-11-10 (×22): qty 1

## 2019-11-10 MED ORDER — PROCHLORPERAZINE 25 MG RE SUPP: 12.5000 mg | Freq: Four times a day (QID) | RECTAL | Status: DC | PRN

## 2019-11-10 MED ORDER — ADULT MULTIVITAMIN W/MINERALS CH: 1.0000 | ORAL_TABLET | Freq: Every day | ORAL | Status: AC

## 2019-11-10 MED ORDER — ALUM & MAG HYDROXIDE-SIMETH 200-200-20 MG/5ML PO SUSP: 30.0000 mL | ORAL | Status: DC | PRN

## 2019-11-10 MED ORDER — BISACODYL 10 MG RE SUPP
10.0000 mg | Freq: Every day | RECTAL | Status: DC | PRN
  Filled 2019-11-10: qty 1

## 2019-11-10 MED ORDER — DOXYCYCLINE HYCLATE 100 MG PO TABS
100.0000 mg | ORAL_TABLET | Freq: Two times a day (BID) | ORAL | Status: AC
  Administered 2019-11-10 – 2019-11-15 (×11): 100 mg via ORAL
  Filled 2019-11-10 (×11): qty 1

## 2019-11-10 MED ORDER — HYDROCODONE-ACETAMINOPHEN 5-325 MG PO TABS
1.0000 | ORAL_TABLET | Freq: Four times a day (QID) | ORAL | Status: DC | PRN
  Administered 2019-11-10 – 2019-11-11 (×3): 1 via ORAL
  Filled 2019-11-10 (×3): qty 1

## 2019-11-10 MED ORDER — ADULT MULTIVITAMIN W/MINERALS CH
1.0000 | ORAL_TABLET | Freq: Every day | ORAL | Status: DC
  Administered 2019-11-11 – 2019-11-21 (×11): 1 via ORAL
  Filled 2019-11-10 (×11): qty 1

## 2019-11-10 MED ORDER — LOSARTAN POTASSIUM 25 MG PO TABS
25.0000 mg | ORAL_TABLET | Freq: Every day | ORAL | Status: DC
  Administered 2019-11-11 – 2019-11-21 (×11): 25 mg via ORAL
  Filled 2019-11-10 (×11): qty 1

## 2019-11-10 MED ORDER — GUAIFENESIN-DM 100-10 MG/5ML PO SYRP: 5.0000 mL | ORAL_SOLUTION | Freq: Four times a day (QID) | ORAL | Status: DC | PRN

## 2019-11-10 MED ORDER — AMLODIPINE BESYLATE 10 MG PO TABS
10.0000 mg | ORAL_TABLET | Freq: Every day | ORAL | Status: DC
  Administered 2019-11-11 – 2019-11-17 (×7): 10 mg via ORAL
  Filled 2019-11-10 (×7): qty 1

## 2019-11-10 MED ORDER — POTASSIUM CHLORIDE CRYS ER 20 MEQ PO TBCR
20.0000 meq | EXTENDED_RELEASE_TABLET | Freq: Two times a day (BID) | ORAL | Status: AC
  Administered 2019-11-10 – 2019-11-11 (×3): 20 meq via ORAL
  Filled 2019-11-10 (×3): qty 1

## 2019-11-10 MED ORDER — ACETAMINOPHEN 325 MG PO TABS
325.0000 mg | ORAL_TABLET | ORAL | Status: DC | PRN
  Filled 2019-11-10: qty 2

## 2019-11-10 MED ORDER — MAGNESIUM OXIDE 400 (241.3 MG) MG PO TABS: 400.0000 mg | ORAL_TABLET | Freq: Two times a day (BID) | ORAL | Status: DC

## 2019-11-10 MED ORDER — ENOXAPARIN SODIUM 40 MG/0.4ML ~~LOC~~ SOLN: 40.0000 mg | SUBCUTANEOUS | Status: DC

## 2019-11-10 MED ORDER — POLYETHYLENE GLYCOL 3350 17 G PO PACK
17.0000 g | PACK | Freq: Every day | ORAL | Status: DC | PRN
  Filled 2019-11-10: qty 1

## 2019-11-10 NOTE — H&P (Addendum)
Physical Medicine and Rehabilitation Admission H&P    Chief Complaint  Patient presents with  . Functional decline due to wound infections  . History of B-AKA    HPI: [name] is a 59 year old female with history of HTN, cocaine induced vasculopathy, medical non-compliance,  levamisole induced vasculitis, s/p B-AKA who was admitted on 11/04/2019 with pain in b/l stumps, multiple skin lesions, purulent drainage from R-AKA and painful ulcer L-AKA. History taken from chart review and patient. UDS positive for opiates and cocaine.  She was started on broad spectrum antibiotics for sepsis due to cellulitis and MRI right thigh showed marked soft tissue edema with ascites within visualized portion of pelvis--no evidence of osteomyelitis.   She was taken to OR on 11/08/2019 for I&D of necrotic tissue with placement of Prevana VAC by Dr. Sharol Given. Antibiotics narrowed to doxycycline post procedure with recommendations to continue for 7 total day treatment. Post op therapy evaluations done revealing limitation in mobility and ADLs due to pain and weakness. CIR recommended due to functional decline. Please see preadmission assessment from earlier today as well.    Review of Systems  Constitutional: Positive for malaise/fatigue. Negative for chills and fever.  HENT: Negative for hearing loss and tinnitus.   Eyes: Negative for blurred vision and double vision.  Respiratory: Negative for cough and shortness of breath.   Cardiovascular: Negative for chest pain and palpitations.  Gastrointestinal: Negative for abdominal pain, constipation, heartburn and nausea.  Genitourinary: Negative for dysuria and urgency.  Musculoskeletal: Positive for joint pain and myalgias. Negative for back pain.  Skin: Negative for rash.  Neurological: Positive for weakness. Negative for dizziness.  Psychiatric/Behavioral: The patient does not have insomnia.       Past Medical History:  Diagnosis Date  . CAP (community  acquired pneumonia) 03/2014 X 2  . Cocaine abuse ([place])    ongoing with resultant vaculitis.  . Depression   . Headache    "weekly" (07/29/2016)  . Hypertension   . Inflammatory arthritis   . Migraines    "probably 5-6/yr" (07/29/2016)  . Normocytic anemia    BL Hgb 9.8-12. Last anemia panel 04/2010 - showing Fe 19, ferritin 101.  Pt on monthly B12 injections  . Oth psychoactive substance abuse w mood disorder ([place]) 2014  . Rheumatoid arthritis(714.0)    patient reported  . S/P bilateral below knee amputation ([place]) 04/13/2017  . 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 Followed by Dr. Louanne Skye    Past Surgical History:  Procedure Laterality Date  . AMPUTATION Left 05/22/2016   Procedure: AMPUTATION LEFT LONG FINGER;  Surgeon: Marybelle Killings, MD;  Location: [facility];  Service: Orthopedics;  Laterality: Left;  . AMPUTATION Bilateral 04/10/2017   Procedure: AMPUTATION BELOW KNEE;  Surgeon: Newt Minion, MD;  Location: [facility];  Service: Orthopedics;  Laterality: Bilateral;  . AMPUTATION Bilateral 02/06/2018   Procedure: AMPUTATION ABOVE KNEE;  Surgeon: Newt Minion, MD;  Location: [place];  Service: Orthopedics;  Laterality: Bilateral;  . HERNIA REPAIR     "stomach"  . I & D EXTREMITY Right 09/26/2015   Procedure: IRRIGATION AND DEBRIDEMENT LEG WOUND  VAC PLACEMENT.;  Surgeon: Loel Lofty Dillingham, DO;  Location: [facility];  Service: Plastics;  Laterality: Right;  . INCISION AND DRAINAGE OF WOUND Bilateral 10/20/2016   Procedure: IRRIGATION AND DEBRIDEMENT WOUND BILATERAL;  Surgeon: Edrick Kins, DPM;  Location: [facility];  Service: Podiatry;  Laterality: Bilateral;  . IRRIGATION AND DEBRIDEMENT ABSCESS Bilateral 09/26/2013   Procedure: DEBRIDEMENT ULCERS BILATERAL THIGHS;  Surgeon: Gwenyth Ober, MD;  Location: [place] [facility];  Service: General;  Laterality: Bilateral;  . SKIN BIOPSY Bilateral    shin nodules  . STUMP REVISION Bilateral 11/08/2019   Procedure:  REVISION BILATERAL ABOVE KNEE AMPUTATION;  Surgeon: Newt Minion, MD;  Location: [facility] [facility];  Service: Orthopedics;  Laterality: Bilateral;    Family History  Problem Relation Age of Onset  . Breast cancer Mother        Breast cancer  . Alcohol abuse Mother   . Colon cancer Maternal Aunt 67  . Alcohol abuse Father     Social History:  Lives in a boarding home with her brother. Uses motorized wheelchair. independnet for transfers and self care PTA. She reports that she has been smoking cigarettes --down to 3-4 per day. She has a 4.56 pack-year smoking history. She has never used smokeless tobacco. Per reports current drug use. Drugs: "Crack" cocaine and Cocaine. She reports that she does not drink alcohol.     Allergies  Allergen Reactions  . Levamisole     Levamisole is a chemical adulterant of cocaine which can cause cutaneous necrosis It is used as an Health and safety inspector in veterinary medicine  . Acetaminophen Swelling and Other (See Comments)    "Eyelid swelling" but apparently tolerates Percocet and Vicodin just fine...  . Haldol [Haloperidol Lactate]     Possible eye swelling was given together with Toradol unsure if caused the reaction  . Lisinopril     UNSPECIFIED REACTION   . Toradol [Ketorolac Tromethamine]     Eye swelling  . Tramadol Rash    Medications Prior to Admission  Medication Sig Dispense Refill  . [EXPIRED] doxycycline (VIBRAMYCIN) 100 MG capsule Take 1 capsule (100 mg total) by mouth 2 (two) times daily for 7 days. 14 capsule 0  . lidocaine (XYLOCAINE) 5 % ointment Apply 1 application topically as needed. 35.44 g 0  . amLODipine (NORVASC) 10 MG tablet Take 1 tablet (10 mg total) by mouth daily. (Patient not taking: Reported on 11/04/2019) 30 tablet 6  . nystatin cream (MYCOSTATIN) Apply to affected area 2 times daily (Patient not taking: Reported on 11/04/2019) 30 g 0  . oxyCODONE (OXY IR/ROXICODONE) 5 MG immediate release tablet Take 1 tablet (5 mg total) by  mouth every 6 (six) hours as needed for moderate pain. (Patient not taking: Reported on 11/04/2019) 15 tablet 0  . polyethylene glycol (MIRALAX / GLYCOLAX) 17 g packet Take 17 g by mouth daily. (Patient not taking: Reported on 11/04/2019) 14 each 0    Drug Regimen Review  Drug regimen was reviewed and remains appropriate with no significant issues identified  Home: Home Living Family/patient expects to be discharged to:: Other (Comment)(boarding house) Additional Comments: pt states she has a few steps to enter home, but she bumps up steps with w/c and assist of brother/boarding house employees.   Functional History: Prior Function Level of Independence: Independent with assistive device(s) ADL's / Homemaking Assistance Needed: reports that she is independent with bathing (sponge bathes), dressing and toileting. Says she eats take out for most meals Comments: Pt reports using w/c independently, performs AP transfers in and out of w/c without assist. Pt states she sponge bathes and her brother provides food for her.  Functional Status:  Mobility: Bed Mobility Overal bed mobility: Needs Assistance Bed Mobility: Supine to Sit, Sit to Supine, Rolling  Rolling: Mod assist Supine to sit: Min assist, HOB elevated Sit to supine: Min assist, HOB elevated General bed mobility comments: min A to long sit in bed using rails, however pt long sat up about half way and was unable to complete due to pain Transfers General transfer comment: pt declined - severe pain with mobility this session Ambulation/Gait General Gait Details: unable    ADL: ADL Overall ADL's : Needs assistance/impaired Eating/Feeding: Set up, Sitting, Bed level Eating/Feeding Details (indicate cue type and reason): HOB raised Grooming: Wash/dry hands, Wash/dry face, Set up, Bed level Upper Body Bathing: Moderate assistance, Bed level Lower Body Bathing: Maximal assistance, Bed level Upper Body Dressing : Moderate assistance,  Bed level Lower Body Dressing: Total assistance, Bed level Toileting- Clothing Manipulation and Hygiene: Total assistance, Bed level General ADL Comments: unabel to complete long sitting in bed  Cognition: Cognition Overall Cognitive Status: Impaired/Different from baseline Orientation Level: Oriented X4 Cognition Arousal/Alertness: Awake/alert Behavior During Therapy: WFL for tasks assessed/performed Overall Cognitive Status: Impaired/Different from baseline Area of Impairment: Orientation, Following commands, Problem solving Orientation Level: Disoriented to, Time, Situation Following Commands: Follows one step commands consistently, Follows one step commands with increased time Problem Solving: Slow processing, Difficulty sequencing, Requires verbal cues, Requires tactile cues General Comments: pt drowsy upon arrival to room, but wakes with PT entrance and discussion. Pt states "it's 2020 something" when asked the year, and does not recall procedure yesterday. Pt requires increased time to follow commands, and requires multimodal cuing to mobilize.   Blood pressure (!) 135/93, pulse 85, temperature 98.4 F (36.9 C), temperature source Oral, resp. rate 17, height 5' 1.5" (1.562 m), weight 40.5 kg, SpO2 96 %. Physical Exam  Nursing note and vitals reviewed. Constitutional: She is oriented to person, place, and time. She appears well-developed. She appears cachectic. She has a sickly appearance. No distress.  HENT:  Head: Normocephalic and atraumatic.  Eyes: EOM are normal. Right eye exhibits no discharge. Left eye exhibits no discharge.  Neck: No tracheal deviation present. No thyromegaly present.  Respiratory: Effort normal. No stridor. No respiratory distress.  GI: Soft. She exhibits no distension.  Musculoskeletal:     Comments: B/l AKA with edema and tenderness Left hand digit amputation  Neurological: She is alert and oriented to person, place, and time.  Motor: B/l UE: 4/5  proximal to distal B/l LE: HF 1/5 (pain inhibition)  Skin: She is not diaphoretic.  +Vac b/l stumps  Psychiatric: She has a normal mood and affect. Her behavior is normal.    Results for orders placed or performed during the hospital encounter of 11/04/19 (from the past 48 hour(s))  Basic metabolic panel     Status: Abnormal   Collection Time: 11/09/19  7:12 AM  Result Value Ref Range   Sodium 138 135 - 145 mmol/L   Potassium 3.4 (L) 3.5 - 5.1 mmol/L   Chloride 105 98 - 111 mmol/L   CO2 22 22 - 32 mmol/L   Glucose, Bld 160 (H) 70 - 99 mg/dL   BUN 15 6 - 20 mg/dL   Creatinine, Ser 0.85 0.44 - 1.00 mg/dL   Calcium 8.2 (L) 8.9 - 10.3 mg/dL   GFR calc non Af Amer >60 >60 mL/min   GFR calc Af Amer >60 >60 mL/min   Anion gap 11 5 - 15    Comment: Performed at [facility], [place] [address], [place], [place] [address]  Magnesium     Status: None   Collection  Time: 11/09/19  7:12 AM  Result Value Ref Range   Magnesium 1.8 1.7 - 2.4 mg/dL    Comment: Performed at [facility] [address], [place], [place] [address]  Phosphorus     Status: None   Collection Time: 11/09/19  7:12 AM  Result Value Ref Range   Phosphorus 2.9 2.5 - 4.6 mg/dL    Comment: Performed at [facility] [address], [place], [place] [address]   No results found.     Medical Problem List and Plan: 1.  Deficits with mobility, transfers, self-care secondary to bilateral AKA revision.  -patient may shower  -ELOS/Goals: 2-4 days/Mod I at wheelchair level. 2.  Antithrombotics: -DVT/anticoagulation:  Pharmaceutical: Lovenox  -antiplatelet therapy: N/A 3. Chronic pain/Pain Management: Hydrocodone prn.   Monitor with increased mobility 4. Mood: LCSW to follow for evaluation and support.   -antipsychotic agents: N/A 5. Neuropsych: This patient is capable of making decisions on her own behalf. 6. Skin/Wound Care: Monitor wounds for healing ---continue Ensure supplements due to malnourished  state.  7. Fluids/Electrolytes/Nutrition: Monitor I/O. CMP ordered.  8. HTN: Monitor BP tid--continue Norvasc.   Monitor with increased mobility. 9. Leucocytosis: Continue to monitor for signs of infection.  CBC ordered.  10. Hypokalemia: Resolving with addition of supplement.   CMP ordered 11.  Abnormal LFTs: Resolving.   CMP ordered 12. Tobacco use/Cocaine abuse: Continue to educate on importance of cessation.   Bary Leriche, PA-C 11/10/2019  I have personally performed a face to face diagnostic evaluation, including, but not limited to relevant history and physical exam findings, of this patient and developed relevant assessment and plan.  Additionally, I have reviewed and concur with the physician assistant's documentation above.  Delice Lesch, MD, ABPMR  The patient's status has not changed. The original post admission physician evaluation remains appropriate, and any changes from the pre-admission screening or documentation from the acute chart are noted above.   Delice Lesch, MD, ABPMR

## 2019-11-10 NOTE — Progress Notes (Signed)
POD 2  AKA Revisions. VSS afebrile. Wound Vacs in place functioning 0 cc in VAC.  DC to possibly CIR. Orthopedic standpoint ok to discharge . Will need follow up 1 week with Dr. Sharol Given

## 2019-11-10 NOTE — Discharge Summary (Signed)
Physician Discharge Summary  LAJEANA STROUGH OJJ:[id number] DOB: Mar 02, 1961 DOA: 11/04/2019  PCP: Azzie Glatter, FNP  Admit date: 11/04/2019 Discharge date: 11/10/2019  Admitted From: Home Disposition: Acute inpatient rehab  Recommendations for Outpatient Follow-up:  1. Follow up with PCP in 1-2 weeks 2. Please obtain BMP/CBC/magnesium/ phosphorus in one week 3. Follow-up with orthopedics in 1 week.   Discharge Condition: Stable CODE STATUS: Full code Diet recommendation: Regular diet, low carb and low salt.  Discharge summary: 59 year old female with history of crack cocaine use, peripheral vascular disease status post bilateral above-knee amputations, hypertension, migraine, chronic anemia presented to the emergency room on 10/31/2019 with pain and discharge from the right above-knee amputation, she was initially discharged on doxycycline, came back with more drainage and swelling and hence admitted to the hospital. In the emergency room lactic acid 3.1.  WBC count normal.  Drainage from right above-knee amputation stump.  Started on antibiotics and admitted to the hospital. On 2/4 she underwent revision of b/l AKA and wound VAC placement.  Infected bilateral above-knee amputation stump due to severe peripheral vascular disease: MRI negative for osteo. Underwent surgical revision bilateral above-knee amputation 2/4, postop wound VAC placed. Was treated with broad-spectrum antibiotics.  Changed to oral doxycycline for 7 days. Will need discharge with wound VAC, orthopedics to follow-up in 1 week. Adequate pain medications.  Currently maintained on oral pain medications.  Hypertension: Blood pressure stable.  On losartan that she will continue.  Anemia of chronic disease: Hemoglobin is stable.    Hypokalemia: Persistent.  On the schedule replacement with potassium and magnesium.  Please recheck in 1 week.  Nutrition Status: Nutrition Problem: Moderate Malnutrition Etiology:  chronic illness(PAD) Signs/Symptoms: mild fat depletion, moderate fat depletion, mild muscle depletion, moderate muscle depletion Interventions: Ensure Enlive (each supplement provides 350kcal and 20 grams of protein), MVI, Magic cup  Patient is medically stable to transfer to acute inpatient rehab to continue multidisciplinary therapy and treat underlying complex medical issues.  Discharge Diagnoses:  Principal Problem:   Sepsis ([place]) Active Problems:   Essential hypertension   Dehiscence of amputation stump (HCC)   Malnutrition of moderate degree   Ulcer of right thigh, limited to breakdown of skin (HCC)   S/P bilateral above knee amputation (HCC)   Cellulitis   Stump pain (HCC)    Discharge Instructions  Discharge Instructions    Diet - low sodium heart healthy   Complete by: As directed    Discharge wound care:   Complete by: As directed    Wound vac , leave , consult orthopedic surgery in one week   Increase activity slowly   Complete by: As directed    Negative Pressure Wound Therapy - Incisional   Complete by: As directed    Show patient how to attach prevena pumps and that they are charged     Allergies as of 11/10/2019      Reactions   Levamisole    Levamisole is a chemical adulterant of cocaine which can cause cutaneous necrosis It is used as an Health and safety inspector in veterinary medicine   Acetaminophen Swelling, Other (See Comments)   "Eyelid swelling" but apparently tolerates Percocet and Vicodin just fine...   Haldol [haloperidol Lactate]    Possible eye swelling was given together with Toradol unsure if caused the reaction   Lisinopril    UNSPECIFIED REACTION    Toradol [ketorolac Tromethamine]    Eye swelling   Tramadol Rash      Medication List  STOP taking these medications   doxycycline 100 MG capsule Commonly known as: VIBRAMYCIN Replaced by: doxycycline 100 MG tablet   nystatin cream Commonly known as: MYCOSTATIN     TAKE these  medications   amLODipine 10 MG tablet Commonly known as: NORVASC Take 1 tablet (10 mg total) by mouth daily.   doxycycline 100 MG tablet Commonly known as: VIBRA-TABS Take 1 tablet (100 mg total) by mouth every 12 (twelve) hours for 7 days. Replaces: doxycycline 100 MG capsule   lidocaine 5 % ointment Commonly known as: XYLOCAINE Apply 1 application topically as needed.   losartan 25 MG tablet Commonly known as: COZAAR Take 1 tablet (25 mg total) by mouth daily. Start taking on: November 11, 2019   magnesium oxide 400 (241.3 Mg) MG tablet Commonly known as: MAG-OX Take 1 tablet (400 mg total) by mouth 2 (two) times daily.   multivitamin with minerals Tabs tablet Take 1 tablet by mouth daily. Start taking on: November 11, 2019   oxyCODONE 5 MG immediate release tablet Commonly known as: Oxy IR/ROXICODONE Take 1-2 tablets (5-10 mg total) by mouth every 4 (four) hours as needed for moderate pain (pain score 4-6). What changed:   how much to take  when to take this  reasons to take this   polyethylene glycol 17 g packet Commonly known as: MIRALAX / GLYCOLAX Take 17 g by mouth daily.   potassium chloride SA 20 MEQ tablet Commonly known as: KLOR-CON Take 1 tablet (20 mEq total) by mouth daily.            Discharge Care Instructions  (From admission, onward)         Start     Ordered   11/10/19 0000  Discharge wound care:    Comments: Wound vac , leave , consult orthopedic surgery in one week   11/10/19 1300         Follow-up Information    Newt Minion, MD In 1 week.   Specialty: Orthopedic Surgery Contact information: [address] [place] [address] [phone]          Allergies  Allergen Reactions  . Levamisole     Levamisole is a chemical adulterant of cocaine which can cause cutaneous necrosis It is used as an Health and safety inspector in veterinary medicine  . Acetaminophen Swelling and Other (See Comments)    "Eyelid swelling" but  apparently tolerates Percocet and Vicodin just fine...  . Haldol [Haloperidol Lactate]     Possible eye swelling was given together with Toradol unsure if caused the reaction  . Lisinopril     UNSPECIFIED REACTION   . Toradol [Ketorolac Tromethamine]     Eye swelling  . Tramadol Rash    Consultations:  Orthopedics   Procedures/Studies: MR FEMUR RIGHT W WO CONTRAST  Result Date: 11/05/2019 CLINICAL DATA:  History of AKA, stump wound. Evaluate for osteomyelitis EXAM: MRI OF THE RIGHT FEMUR WITHOUT AND WITH CONTRAST TECHNIQUE: Multiplanar, multisequence MR imaging of the right femur was performed both before and after administration of intravenous contrast. CONTRAST:  40m GADAVIST GADOBUTROL 1 MMOL/ML IV SOLN COMPARISON:  X-ray 11/04/2019, MRI 08/17/2018 FINDINGS: Technical note: Examination is degraded by motion artifact and poor fat saturation at the edge of the field of view. Bones/Joint/Cartilage Prior above knee amputation at the distal femoral diaphysis. T1 fatty marrow signal appears preserved throughout the femur although artifact slightly degrades the distal-most evaluation of the stump. No abnormal bone marrow edema or enhancement is seen. Hip joint  is intact with mild degenerative changes. No fracture or dislocation. No hip joint effusion. Ligaments, Muscles, and Tendons Diffuse T2 hyperintense signal throughout the visualized musculature without appreciable intramuscular enhancement. Findings likely reflecting chronic denervation changes. No well-defined intramuscular fluid collections. Multiple postsurgical tendon stumps are noted within the soft tissues. 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 Soft tissues Marked circumferential soft tissue edema, which may be due to fluid overload, anasarca, versus cellulitis. Stable small T2 hyperintense collections adjacent to the bilateral pectineus muscles without associated enhancement, are unchanged from prior. Otherwise, no drainable soft tissue fluid  collection. Ascites is noted within the visualized portion of the pelvis. IMPRESSION: 1. No MRI evidence of osteomyelitis or soft tissue abscess. Artifact slightly degrades evaluation of the distal most aspect of the femoral stump. 2. Marked circumferential soft tissue edema, which may be due to fluid overload, anasarca, versus cellulitis. 3. Ascites within the visualized portion of the pelvis. Electronically Signed   By: Davina Poke D.O.   On: 11/05/2019 13:15   DG Femur Min 2 Views Right  Result Date: 11/04/2019 CLINICAL DATA:  Pain at the level of above knee amputation stump. EXAM: RIGHT FEMUR 2 VIEWS COMPARISON:  08/13/2018 FINDINGS: Stable appearance of residual right femur with no evidence of fracture or bony destruction. Stable benign-appearing sclerotic region in the femoral neck. Surrounding soft tissues appear unremarkable. No soft tissue foreign body identified. IMPRESSION: No evidence of fracture or radiographic evidence to suggest osteomyelitis. Electronically Signed   By: Aletta Edouard M.D.   On: 11/04/2019 15:03    Subjective: Patient seen and examined.  No overnight events.  She took some oral pain medicine and is controlled.  She is looking forward to go to rehab.   Discharge Exam: Vitals:   11/10/19 0335 11/10/19 0752  BP: 130/88 (!) 135/93  Pulse: 87 85  Resp: 18 17  Temp: 98.7 F (37.1 C) 98.4 F (36.9 C)  SpO2: 94% 96%   Vitals:   11/09/19 1414 11/09/19 1928 11/10/19 0335 11/10/19 0752  BP: 106/72 119/77 130/88 (!) 135/93  Pulse: 90 88 87 85  Resp: 17 17 18 17   Temp: 98.8 F (37.1 C) 98.6 F (37 C) 98.7 F (37.1 C) 98.4 F (36.9 C)  TempSrc: Oral Oral Oral Oral  SpO2: 90% 93% 94% 96%  Weight:      Height:        General: Pt is alert, awake, not in acute distress, chronically sick looking.  Thinly built. Cardiovascular: RRR, S1/S2 +, no rubs, no gallops, no added sounds Respiratory: CTA bilaterally, no wheezing, no rhonchi, no added sounds Abdominal:  Soft, NT, ND, bowel sounds + Extremities: Bilateral above-knee amputation stump fitted with wound VAC, nontender, visible margins clean and dry.  Wound VAC with no drain.    The results of significant diagnostics from this hospitalization (including imaging, microbiology, ancillary and laboratory) are listed below for reference.     Microbiology: Recent Results (from the past 240 hour(s))  Culture, blood (routine x 2)     Status: None   Collection Time: 11/04/19  3:46 PM   Specimen: BLOOD  Result Value Ref Range Status   Specimen Description   Final    BLOOD RIGHT ANTECUBITAL Performed at [facility], [address] [place], [place] [address]    Special Requests   Final    BOTTLES DRAWN AEROBIC AND ANAEROBIC Blood Culture adequate volume Performed at [facility] [address], [place], [place] [address]  Culture   Final    NO GROWTH 5 DAYS Performed at [facility], [place] [address], [place], [place] [address]    Report Status 11/09/2019 FINAL  Final  Culture, blood (routine x 2)     Status: None   Collection Time: 11/04/19  3:51 PM   Specimen: BLOOD LEFT ARM  Result Value Ref Range Status   Specimen Description   Final    BLOOD LEFT ARM Performed at [facility], [place] [address] [place], [place] [address]    Special Requests   Final    BOTTLES DRAWN AEROBIC AND ANAEROBIC Blood Culture adequate volume Performed at [facility] [address] [place], [place] [address]    Culture   Final    NO GROWTH 5 DAYS Performed at [facility], [place] [address] [place], [place] [address]    Report Status 11/09/2019 FINAL  Final  SARS CORONAVIRUS 2 (TAT 6-24 HRS) Nasopharyngeal Nasopharyngeal Swab     Status: None   Collection Time: 11/04/19  4:40 PM   Specimen: Nasopharyngeal Swab  Result Value Ref Range Status   SARS Coronavirus 2 NEGATIVE NEGATIVE Final    Comment: (NOTE) SARS-CoV-2 target nucleic acids are  NOT DETECTED. The SARS-CoV-2 RNA is generally detectable in upper and lower respiratory specimens during the acute phase of infection. Negative results do not preclude SARS-CoV-2 infection, do not rule out co-infections with other pathogens, and should not be used as the sole basis for treatment or other patient management decisions. Negative results must be combined with clinical observations, patient history, and epidemiological information. The expected result is Negative. Fact Sheet for Patients: SugarRoll.be Fact Sheet for Healthcare Providers: https://www.woods-mathews.com/ This test is not yet approved or cleared by the [place] FDA and  has been authorized for detection and/or diagnosis of SARS-CoV-2 by FDA under an Emergency Use Authorization (EUA). This EUA will remain  in effect (meaning this test can be used) for the duration of the COVID-19 declaration under Section 56 4(b)(1) of the Act, 21 U.S.C. section 360bbb-3(b)(1), unless the authorization is terminated or revoked sooner. Performed at [facility], [place] [address] [place], [place] [address]   MRSA PCR Screening     Status: Abnormal   Collection Time: 11/04/19  8:35 PM   Specimen: Nasopharyngeal  Result Value Ref Range Status   MRSA by PCR POSITIVE (A) NEGATIVE Final    Comment:        The GeneXpert MRSA Assay (FDA approved for NASAL specimens only), is one component of a comprehensive MRSA colonization surveillance program. It is not intended to diagnose MRSA infection nor to guide or monitor treatment for MRSA infections. RESULT CALLED TO, READ BACK BY AND VERIFIED WITH: LINEBACK, RN ON 11/05/19 @ 0058 BY LE Performed at [facility] [address], [place], [place] [address]      Labs: BNP (last 3 results) No results for input(s): BNP in the last 8760 hours. Basic Metabolic Panel: Recent Labs  Lab 11/05/19 0534 11/06/19 0436  11/07/19 0442 11/08/19 0338 11/09/19 0712  NA 134* 137 137 136 138  K 4.3 3.7 3.2* 2.9* 3.4*  CL 106 109 111 106 105  CO2 18* 18* 19* 21* 22  GLUCOSE 115* 78 91 84 160*  BUN 32* 25* 18 9 15   CREATININE 0.86 1.08* 0.63 0.62 0.85  CALCIUM 8.5* 7.9* 8.1* 7.8* 8.2*  MG  --   --   --  1.4*  1.8  PHOS  --   --   --  2.1* 2.9   Liver Function Tests: Recent Labs  Lab 11/06/19 0436 11/07/19 0442 11/08/19 0338  AST 221* 117* 64*  ALT 200* 156* 113*  ALKPHOS 98 90 85  BILITOT 3.5* 3.6* 3.5*  PROT 5.9* 6.1* 5.7*  ALBUMIN 2.6* 2.6* 2.2*   No results for input(s): LIPASE, AMYLASE in the last 168 hours. No results for input(s): AMMONIA in the last 168 hours. CBC: Recent Labs  Lab 11/04/19 1407 11/05/19 0534 11/06/19 0436 11/07/19 0442 11/08/19 0338  WBC 8.2 11.5* 8.0 10.9* 11.4*  NEUTROABS 5.4  --  5.2 8.9* 9.0*  HGB 10.7* 10.2* 10.4* 10.5* 10.6*  HCT 35.2* 33.7* 35.4* 34.9* 33.7*  MCV 84.6 86.4 86.3 85.1 81.2  PLT 191 171 202 193 175   Cardiac Enzymes: No results for input(s): CKTOTAL, CKMB, CKMBINDEX, TROPONINI in the last 168 hours. BNP: Invalid input(s): POCBNP CBG: No results for input(s): GLUCAP in the last 168 hours. D-Dimer No results for input(s): DDIMER in the last 72 hours. Hgb A1c No results for input(s): HGBA1C in the last 72 hours. Lipid Profile No results for input(s): CHOL, HDL, LDLCALC, TRIG, CHOLHDL, LDLDIRECT in the last 72 hours. Thyroid function studies No results for input(s): TSH, T4TOTAL, T3FREE, THYROIDAB in the last 72 hours.  Invalid input(s): FREET3 Anemia work up No results for input(s): VITAMINB12, FOLATE, FERRITIN, TIBC, IRON, RETICCTPCT in the last 72 hours. Urinalysis    Component Value Date/Time   COLORURINE AMBER (A) 09/26/2019 1338   APPEARANCEUR HAZY (A) 09/26/2019 1338   LABSPEC 1.021 09/26/2019 1338   PHURINE 5.0 09/26/2019 1338   GLUCOSEU NEGATIVE 09/26/2019 1338   HGBUR MODERATE (A) 09/26/2019 1338   BILIRUBINUR NEGATIVE  09/26/2019 1338   BILIRUBINUR small 04/29/2015 1533   KETONESUR NEGATIVE 09/26/2019 1338   PROTEINUR 100 (A) 09/26/2019 1338   UROBILINOGEN 1.0 07/26/2017 1550   NITRITE NEGATIVE 09/26/2019 1338   LEUKOCYTESUR SMALL (A) 09/26/2019 1338   Sepsis Labs Invalid input(s): PROCALCITONIN,  WBC,  LACTICIDVEN Microbiology Recent Results (from the past 240 hour(s))  Culture, blood (routine x 2)     Status: None   Collection Time: 11/04/19  3:46 PM   Specimen: BLOOD  Result Value Ref Range Status   Specimen Description   Final    BLOOD RIGHT ANTECUBITAL Performed at [facility], [place] [address], [place], [place] [address]    Special Requests   Final    BOTTLES DRAWN AEROBIC AND ANAEROBIC Blood Culture adequate volume Performed at [facility] [address] [place], [place] [address]    Culture   Final    NO GROWTH 5 DAYS Performed at [facility], [place] [address], [place], [place] [address]    Report Status 11/09/2019 FINAL  Final  Culture, blood (routine x 2)     Status: None   Collection Time: 11/04/19  3:51 PM   Specimen: BLOOD LEFT ARM  Result Value Ref Range Status   Specimen Description   Final    BLOOD LEFT ARM Performed at [facility], [place] [address] [place],  [address]    Special Requests   Final    BOTTLES DRAWN AEROBIC AND ANAEROBIC Blood Culture adequate volume Performed at [facility] [address], [place],  [address]    Culture   Final    NO GROWTH 5 DAYS Performed at [facility], [place]  [address] [place], [place] [address]    Report Status 11/09/2019 FINAL  Final  SARS CORONAVIRUS 2 (TAT 6-24 HRS) Nasopharyngeal Nasopharyngeal Swab     Status: None   Collection Time: 11/04/19  4:40 PM   Specimen: Nasopharyngeal Swab  Result Value Ref Range Status   SARS Coronavirus 2 NEGATIVE NEGATIVE Final    Comment: (NOTE) SARS-CoV-2 target nucleic acids are NOT DETECTED. The SARS-CoV-2 RNA  is generally detectable in upper and lower respiratory specimens during the acute phase of infection. Negative results do not preclude SARS-CoV-2 infection, do not rule out co-infections with other pathogens, and should not be used as the sole basis for treatment or other patient management decisions. Negative results must be combined with clinical observations, patient history, and epidemiological information. The expected result is Negative. Fact Sheet for Patients: SugarRoll.be Fact Sheet for Healthcare Providers: https://www.woods-mathews.com/ This test is not yet approved or cleared by the [place] FDA and  has been authorized for detection and/or diagnosis of SARS-CoV-2 by FDA under an Emergency Use Authorization (EUA). This EUA will remain  in effect (meaning this test can be used) for the duration of the COVID-19 declaration under Section 56 4(b)(1) of the Act, 21 U.S.C. section 360bbb-3(b)(1), unless the authorization is terminated or revoked sooner. Performed at [facility], [place] [address] [place], [place] [address]   MRSA PCR Screening     Status: Abnormal   Collection Time: 11/04/19  8:35 PM   Specimen: Nasopharyngeal  Result Value Ref Range Status   MRSA by PCR POSITIVE (A) NEGATIVE Final    Comment:        The GeneXpert MRSA Assay (FDA approved for NASAL specimens only), is one component of a comprehensive MRSA colonization surveillance program. It is not intended to diagnose MRSA infection nor to guide or monitor treatment for MRSA infections. RESULT CALLED TO, READ BACK BY AND VERIFIED WITH: LINEBACK, RN ON 11/05/19 @ 0058 BY LE Performed at [facility] [address], [place], [place] [address]      Time coordinating discharge:  35 minutes  SIGNED:   Barb Merino, MD  [facility] 11/10/2019, 1:10 PM

## 2019-11-10 NOTE — Progress Notes (Signed)
Pt aware of d/c orders to CIR this afternoon and agreeable, all questions answered to satisfaction. RN awaiting CIR call for transfer.

## 2019-11-10 NOTE — IPOC Note (Signed)
Individualized overall Plan of Care (IPOC) Patient Details Name: [name] MRN: [id number] DOB: [birth year]  Admitting Diagnosis: [facility] Problems: Principal Problem:   Debility Active Problems:   Cocaine-induced vascular disorder (HCC)   Cocaine use disorder, severe, dependence ([place])   MDD (major depressive disorder), recurrent episode, severe (HCC)   Protein-calorie malnutrition, severe (HCC)   Acute kidney injury ([place])   Phantom limb pain (HCC)   S/P AKA (above knee amputation) bilateral (HCC)   Anemia of chronic disease   Hyponatremia   Hypoalbuminemia due to protein-calorie malnutrition ([place])     Functional Problem List: Nursing Behavior, Skin Integrity, Bladder, Bowel, Endurance, Medication Management, Motor, Nutrition, Pain, Perception, Safety, Sensory  PT Balance, Behavior, Edema, Endurance, Nutrition, Pain, Perception, Safety, Sensory, Skin Integrity  OT Balance, Motor, Sensory, Skin Integrity, Pain, Cognition, Safety, Endurance  SLP    TR         Basic ADL's: OT Bathing, Dressing, Toileting     Advanced  ADL's: OT       Transfers: PT Bed Mobility, Bed to Chair, Car, [employer], Floor  OT Toilet     Locomotion: PT Ambulation, Emergency planning/management officer, Stairs     Additional Impairments: OT None  SLP        TR      Anticipated Outcomes Item Anticipated Outcome  Self Feeding no goal set  Swallowing      Basic self-care  (S)  Toileting  (S)   Bathroom Transfers (S)  Bowel/Bladder  Patient will manage bowel/bladder with moderate assistance.  Transfers  supervision assist to transfer to [facility]  Locomotion  Mod I WC level  Communication     Cognition     Pain  Patient will manage pain level <4 by discharge.  Safety/Judgment  Patient will remain free of injury during hospitalization.   Therapy Plan: PT Intensity: Minimum of 1-2 x/day ,45 to 90 minutes PT Frequency: 5 out of 7 days PT Duration Estimated Length of Stay: 7-10 days OT  Intensity: Minimum of 1-2 x/day, 45 to 90 minutes OT Frequency: 5 out of 7 days OT Duration/Estimated Length of Stay: 7- 10 days(may need to be altered when participation increases)      Team Interventions: Nursing Interventions    PT interventions Balance/vestibular training, Cognitive remediation/compensation, Community reintegration, Discharge planning, Disease management/prevention, DME/adaptive equipment instruction, Neuromuscular re-education, Functional mobility training, Pain management, Patient/family education, Skin care/wound management, Psychosocial support, Splinting/orthotics, Stair training, Therapeutic Activities, Therapeutic Exercise, UE/LE Strength taining/ROM, UE/LE Coordination activities, Visual/perceptual remediation/compensation, Wheelchair propulsion/positioning  OT Interventions Training and development officer, Community reintegration, Disease mangement/prevention, Barrister's clerk education, Self Care/advanced ADL retraining, Therapeutic Exercise, UE/LE Coordination activities, Therapeutic Activities, Wheelchair propulsion/positioning, Cognitive remediation/compensation, Discharge planning, DME/adaptive equipment instruction, Functional mobility training, Pain management, Psychosocial support, Skin care/wound managment  SLP Interventions    TR Interventions    SW/CM Interventions Discharge Planning, Disease Management/Prevention, Psychosocial Support, Patient/Family Education   Barriers to Discharge MD  Medical stability, Wound care, and B/l LE NWB  Nursing      PT Inaccessible home environment, Decreased caregiver support, Medical stability, Home environment access/layout, IV antibiotics, Wound Care, Lack of/limited family support, Insurance for SNF coverage, Weight bearing restrictions, Medication compliance, Behavior    OT Inaccessible home environment states that a door was used to get power chair up stairs  SLP      SW Inaccessible home environment, Decreased caregiver  support 3 step entry to home; using a door to roll wheelchair over steps to entry   Team Discharge  Planning: Destination: PT-Home ,OT- Home , SLP-  Projected Follow-up: PT-Home health PT, OT-  Home health OT, SLP-  Projected Equipment Needs: PT-To be determined, OT- To be determined, SLP-  Equipment Details: PT- , OT-  Patient/family involved in discharge planning: PT- Patient,  OT-Patient, SLP-   MD ELOS: 4-6 days. Medical Rehab Prognosis:  Good Assessment: 59 year old female with history of HTN, cocaine induced vasculopathy, medical non-compliance,  levamisole induced vasculitis, s/p B-AKA who was admitted on 11/04/2019 with pain in b/l stumps, multiple skin lesions, purulent drainage from R-AKA and painful ulcer L-AKA. UDS positive for opiates and cocaine.  She was started on broad spectrum antibiotics for sepsis due to cellulitis and MRI right thigh showed marked soft tissue edema with ascites within visualized portion of pelvis--no evidence of osteomyelitis.   She was taken to OR on 11/08/2019 for I&D of necrotic tissue with placement of Prevana VAC by Dr. Sharol Given. Antibiotics narrowed to doxycycline post procedure with recommendations to continue for 7 total day treatment. Post op therapy evaluations done revealing limitation in mobility and ADLs due to pain and weakness. Will set goals for Supervision with OT, Mod I at wheelchair level with PT.   Due to the current state of emergency, patients may not be receiving their 3-hours of Medicare-mandated therapy.  See Team Conference Notes for weekly updates to the plan of care

## 2019-11-10 NOTE — Progress Notes (Addendum)
Patient arrived to the unit in a gown, alert and oriented x3, not to time/date. Skin assessment was completed by two nurses and found lesions and bruises on bilateral arms. No skin breakdown on her sacrum but she has white spots, possible healed old pressure ulcer. 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 Unable to assess skin under wound vac dressings. No wound drainage in vac canisters. Patient is incontinent x2. Policies and procedures were reviewed with the patient. Patient has a purple bag and states she has a cell phone and her wallet. Patient declined having a designated visitor.

## 2019-11-10 NOTE — Progress Notes (Signed)
PROGRESS NOTE    [name]  WYO:[id number] DOB: Feb 27, 1961 DOA: 11/04/2019 PCP: Azzie Glatter, FNP    Brief Narrative:  59 year old female with history of crack cocaine use, peripheral vascular disease status post bilateral above-knee amputations, hypertension, migraine, chronic anemia presented to the emergency room on 10/31/2019 with pain and discharge from the right above-knee amputation, she was initially discharged on doxycycline, came back with more drainage and swelling and hence admitted to the hospital. In the emergency room lactic acid 3.1.  WBC count normal.  Drainage from right above-knee amputation stump.  Started on antibiotics and admitted to the hospital. 2/4. undersent revision of b/l AKA  Assessment & Plan:   Principal Problem:   Sepsis ([place]) Active Problems:   Essential hypertension   Dehiscence of amputation stump (HCC)   Malnutrition of moderate degree   Ulcer of right thigh, limited to breakdown of skin (HCC)   S/P bilateral above knee amputation (HCC)   Cellulitis   Stump pain (HCC)  Infected bilateral above-knee amputation stump due to severe peripheral vascular disease: MRI negative for osteo. Underwent surgical revision bilateral above-knee amputation 2/4, postop wound VAC placed. Was treated with broad-spectrum antibiotics.  Changed to oral doxycycline for 7 days. Will need discharge with wound VAC, orthopedics to follow-up. Adequate pain medications.  Discontinue IV pain medications and transition to oral.  Hypertension: Blood pressure stable.  Anemia of chronic disease: Hemoglobin is stable.    Hypokalemia: Persistent.  On the schedule replacement with potassium and magnesium.   Nutrition Status: Nutrition Problem: Moderate Malnutrition Etiology: chronic illness(PAD) Signs/Symptoms: mild fat depletion, moderate fat depletion, mild muscle depletion, moderate muscle depletion Interventions: Ensure Enlive (each supplement provides 350kcal and  20 grams of protein), MVI, Magic cup    DVT prophylaxis: Lovenox subcu Code Status: Full code Family Communication: None Disposition Plan: patient is from boardinghouse. Anticipated DC to acute debilitation [name] , Barriers to discharge CIR availability. Consultants:   Orthopedics  Procedures:   Bilateral above-knee amputation revision and wound VAC placement, 2/3  Antimicrobials:  Anti-infectives (From admission, onward)   Start     Dose/Rate Route Frequency Ordered Stop   11/09/19 1000  doxycycline (VIBRA-TABS) tablet 100 mg     100 mg Oral Every 12 hours 11/09/19 0950 11/16/19 0959   11/08/19 0830  ceFAZolin (ANCEF) IVPB 2g/100 mL premix  Status:  Discontinued     2 g 200 mL/hr over 30 Minutes Intravenous On call to O.R. 11/08/19 0828 11/08/19 1117   11/06/19 1200  vancomycin (VANCOREADY) IVPB 750 mg/150 mL  Status:  Discontinued     750 mg 150 mL/hr over 60 Minutes Intravenous Every 24 hours 11/06/19 1049 11/09/19 0949   11/06/19 0932  vancomycin variable dose per unstable renal function (pharmacist dosing)  Status:  Discontinued      Does not apply See admin instructions 11/06/19 0932 11/06/19 1049   11/05/19 1000  vancomycin (VANCOREADY) IVPB 750 mg/150 mL  Status:  Discontinued     750 mg 150 mL/hr over 60 Minutes Intravenous Every 24 hours 11/04/19 1950 11/06/19 0932   11/05/19 0000  piperacillin-tazobactam (ZOSYN) IVPB 3.375 g  Status:  Discontinued     3.375 g 12.5 mL/hr over 240 Minutes Intravenous Every 8 hours 11/04/19 1857 11/09/19 0949   11/04/19 1900  piperacillin-tazobactam (ZOSYN) IVPB 3.375 g     3.375 g 100 mL/hr over 30 Minutes Intravenous  Once 11/04/19 1857 11/04/19 2031   11/04/19 1600  vancomycin (VANCOCIN) IVPB 1000 mg/200 mL  premix     1,000 mg 200 mL/hr over 60 Minutes Intravenous  Once 11/04/19 1552 11/04/19 1746   11/04/19 1600  ceFEPIme (MAXIPIME) 2 g in sodium chloride 0.9 % 100 mL IVPB     2 g 200 mL/hr over 30 Minutes Intravenous  Once  11/04/19 1552 11/04/19 1653         Subjective: Patient seen and examined.  No overnight events.  Pain managed with oral pain medications.  Objective: Vitals:   11/09/19 1414 11/09/19 1928 11/10/19 0335 11/10/19 0752  BP: 106/72 119/77 130/88 (!) 135/93  Pulse: 90 88 87 85  Resp: 17 17 18 17   Temp: 98.8 F (37.1 C) 98.6 F (37 C) 98.7 F (37.1 C) 98.4 F (36.9 C)  TempSrc: Oral Oral Oral Oral  SpO2: 90% 93% 94% 96%  Weight:      Height:        Intake/Output Summary (Last 24 hours) at 11/10/2019 1120 Last data filed at 11/10/2019 0900 Gross per 24 hour  Intake 480 ml  Output 1800 ml  Net -1320 ml   Filed Weights   11/05/19 0500 11/08/19 0854  Weight: 40.5 kg 40.5 kg    Examination:  General exam: Appears calm and comfortable, chronically sick looking.  Not in any distress. Respiratory system: Clear to auscultation. Respiratory effort normal. Cardiovascular system: S1 & S2 heard, RRR.  Gastrointestinal system: Abdomen is nondistended, soft and nontender. Central nervous system: Alert and oriented. No focal neurological deficits. Extremities: Symmetric 5 x 5 power.  Bilateral above-knee amputation stumps with wound VAC, nontender, no drainage in the wound VAC. Skin: No rashes, lesions or ulcers Psychiatry: Judgement and insight appear normal. Mood & affect flat.    Data Reviewed: I have personally reviewed following labs and imaging studies  CBC: Recent Labs  Lab 11/04/19 1407 11/05/19 0534 11/06/19 0436 11/07/19 0442 11/08/19 0338  WBC 8.2 11.5* 8.0 10.9* 11.4*  NEUTROABS 5.4  --  5.2 8.9* 9.0*  HGB 10.7* 10.2* 10.4* 10.5* 10.6*  HCT 35.2* 33.7* 35.4* 34.9* 33.7*  MCV 84.6 86.4 86.3 85.1 81.2  PLT 191 171 202 193 673   Basic Metabolic Panel: Recent Labs  Lab 11/05/19 0534 11/06/19 0436 11/07/19 0442 11/08/19 0338 11/09/19 0712  NA 134* 137 137 136 138  K 4.3 3.7 3.2* 2.9* 3.4*  CL 106 109 111 106 105  CO2 18* 18* 19* 21* 22  GLUCOSE 115* 78 91  84 160*  BUN 32* 25* 18 9 15   CREATININE 0.86 1.08* 0.63 0.62 0.85  CALCIUM 8.5* 7.9* 8.1* 7.8* 8.2*  MG  --   --   --  1.4* 1.8  PHOS  --   --   --  2.1* 2.9   GFR: Estimated Creatinine Clearance: 47.3 mL/min (by C-G formula based on SCr of 0.85 mg/dL). Liver Function Tests: Recent Labs  Lab 11/06/19 0436 11/07/19 0442 11/08/19 0338  AST 221* 117* 64*  ALT 200* 156* 113*  ALKPHOS 98 90 85  BILITOT 3.5* 3.6* 3.5*  PROT 5.9* 6.1* 5.7*  ALBUMIN 2.6* 2.6* 2.2*   No results for input(s): LIPASE, AMYLASE in the last 168 hours. No results for input(s): AMMONIA in the last 168 hours. Coagulation Profile: No results for input(s): INR, PROTIME in the last 168 hours. Cardiac Enzymes: No results for input(s): CKTOTAL, CKMB, CKMBINDEX, TROPONINI in the last 168 hours. BNP (last 3 results) No results for input(s): PROBNP in the last 8760 hours. HbA1C: No results for input(s): HGBA1C in  the last 72 hours. CBG: No results for input(s): GLUCAP in the last 168 hours. Lipid Profile: No results for input(s): CHOL, HDL, LDLCALC, TRIG, CHOLHDL, LDLDIRECT in the last 72 hours. Thyroid Function Tests: No results for input(s): TSH, T4TOTAL, FREET4, T3FREE, THYROIDAB in the last 72 hours. Anemia Panel: No results for input(s): VITAMINB12, FOLATE, FERRITIN, TIBC, IRON, RETICCTPCT in the last 72 hours. Sepsis Labs: Recent Labs  Lab 11/04/19 1407 11/04/19 1607 11/05/19 0534  LATICACIDVEN 3.1* 3.6* 2.2*    Recent Results (from the past 240 hour(s))  Culture, blood (routine x 2)     Status: None   Collection Time: 11/04/19  3:46 PM   Specimen: BLOOD  Result Value Ref Range Status   Specimen Description   Final    BLOOD RIGHT ANTECUBITAL Performed at [facility], [place] [address] [place], [place] [address]    Special Requests   Final    BOTTLES DRAWN AEROBIC AND ANAEROBIC Blood Culture adequate volume Performed at [facility] [address] [place],  [place] [address]    Culture   Final    NO GROWTH 5 DAYS Performed at [facility], [place] [address] [place], [place] [address]    Report Status 11/09/2019 FINAL  Final  Culture, blood (routine x 2)     Status: None   Collection Time: 11/04/19  3:51 PM   Specimen: BLOOD LEFT ARM  Result Value Ref Range Status   Specimen Description   Final    BLOOD LEFT ARM Performed at [facility], [place] [address] [place], [place] [address]    Special Requests   Final    BOTTLES DRAWN AEROBIC AND ANAEROBIC Blood Culture adequate volume Performed at [place] [address] [place], [place] [address]    Culture   Final    NO GROWTH 5 DAYS Performed at [facility], [place] [address] [place], [place] [address]    Report Status 11/09/2019 FINAL  Final  SARS CORONAVIRUS 2 (TAT 6-24 HRS) Nasopharyngeal Nasopharyngeal Swab     Status: None   Collection Time: 11/04/19  4:40 PM   Specimen: Nasopharyngeal Swab  Result Value Ref Range Status   SARS Coronavirus 2 NEGATIVE NEGATIVE Final    Comment: (NOTE) SARS-CoV-2 target nucleic acids are NOT DETECTED. The SARS-CoV-2 RNA is generally detectable in upper and lower respiratory specimens during the acute phase of infection. Negative results do not preclude SARS-CoV-2 infection, do not rule out co-infections with other pathogens, and should not be used as the sole basis for treatment or other patient management decisions. Negative results must be combined with clinical observations, patient history, and epidemiological information. The expected result is Negative. Fact Sheet for Patients: SugarRoll.be Fact Sheet for Healthcare Providers: https://www.woods-mathews.com/ This test is not yet approved or cleared by the [place] FDA and  has been authorized for detection and/or diagnosis of SARS-CoV-2 by FDA under an Emergency Use Authorization (EUA). This EUA will remain  in  effect (meaning this test can be used) for the duration of the COVID-19 declaration under Section 56 4(b)(1) of the Act, 21 U.S.C. section 360bbb-3(b)(1), unless the authorization is terminated or revoked sooner. Performed at [facility], [place] [address], [place],  [address]   MRSA PCR Screening     Status: Abnormal   Collection Time: 11/04/19  8:35 PM   Specimen: Nasopharyngeal  Result Value Ref Range Status   MRSA by PCR POSITIVE (A)  NEGATIVE Final    Comment:        The GeneXpert MRSA Assay (FDA approved for NASAL specimens only), is one component of a comprehensive MRSA colonization surveillance program. It is not intended to diagnose MRSA infection nor to guide or monitor treatment for MRSA infections. RESULT CALLED TO, READ BACK BY AND VERIFIED WITH: LINEBACK, RN ON 11/05/19 @ 0058 BY LE Performed at [facility] [address], [place], [place] [address]          Radiology Studies: No results found.      Scheduled Meds: . amLODipine  10 mg Oral Daily  . docusate sodium  100 mg Oral BID  . doxycycline  100 mg Oral Q12H  . enoxaparin (LOVENOX) injection  30 mg Subcutaneous Q24H  . feeding supplement (ENSURE ENLIVE)  237 mL Oral BID BM  . losartan  25 mg Oral Daily  . magnesium oxide  400 mg Oral BID  . mouth rinse  15 mL Mouth Rinse BID  . multivitamin with minerals  1 tablet Oral Daily  . polyethylene glycol  17 g Oral Daily  . potassium chloride  20 mEq Oral BID   Continuous Infusions: . lactated ringers 10 mL/hr at 11/08/19 0903     LOS: 6 days    Time spent: 25 minutes    Barb Merino, MD [facility] Pager [phone]

## 2019-11-10 NOTE — Progress Notes (Signed)
Pt iv removed and report called to CIR. Pt belongings gathered and is ready to be discharged.

## 2019-11-10 NOTE — Plan of Care (Signed)

## 2019-11-10 NOTE — PMR Pre-admission (Signed)
PMR Admission Coordinator Pre-Admission Assessment  Patient: [name] is an 59 y.o., female MRN: [id number] DOB: Oct 22, 1960 Height: 5' 1.5" (156.2 cm) Weight: 40.5 kg  Insurance Information HMO:     PPO:      PCP:      IPA:      80/20:      OTHER:  PRIMARY: [place]      Policy#: [id number]      Subscriber: pt Coverage code: MAD Eff. Date: active as of 11/10/19     Deduct:       Out of Pocket Max:       Life Max:  CIR:       SNF:  Outpatient:      Co-Pay:  Home Health:       Co-Pay:  DME:      Co-Pay:  Providers:  SECONDARY:       Policy#:       Subscriber:  CM Name:       Phone#:      Fax#:  Pre-Cert#:       Employer:  Benefits:  Phone #:      Name:  Eff. Date:      Deduct:       Out of Pocket Max:       Life Max:  CIR:       SNF:  Outpatient:      Co-Pay:  Home Health:       Co-Pay:  DME:      Co-Pay:   Medicaid Application Date:       Case Manager:  Disability Application Date:       Case Worker:   The "Data Collection Information Summary" for patients in Inpatient Rehabilitation Facilities with attached "Privacy Act [employer] Records" was provided and verbally reviewed with: N/A  Emergency Contact Information Contact Information    Name Relation Home Work Mobile   McNair,[name] Brother   [phone]      Current Medical History  Patient Admitting Diagnosis: bilat AKA revision   History of Present Illness: [name]. 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 UDS positive for cocaine and opiates.  She was started on broad spectrum antibiotics for cellulitis and MRI right thigh showed marked soft tissue edema with ascites within visualized portion of  pelvis--no evidence of osteomyelitis.   She was taken to OR on 2/03 for I&D of necrotic tissue and revision of AKA bilaterally with placement of Prevana VAC by Dr. Sharol Given. Post op therapy evaluations done revealing limitation in mobility and ADLs due to pain and weakness. CIR recommended due to functional decline.   Patient's medical record from [facility] has been reviewed by the rehabilitation admission coordinator and physician.  Past Medical History  Past Medical History:  Diagnosis Date  . CAP (community acquired pneumonia) 03/2014 X 2  . Cocaine abuse ([place])    ongoing with resultant vaculitis.  . Depression   . Headache    "weekly" (07/29/2016)  . Hypertension   . Inflammatory arthritis   . Migraines    "probably 5-6/yr" (07/29/2016)  . Normocytic anemia    BL Hgb 9.8-12. Last anemia panel 04/2010 - showing Fe 19, ferritin  101.  Pt on monthly B12 injections  . Oth psychoactive substance abuse w mood disorder ([place]) 2014  . Rheumatoid arthritis(714.0)    patient reported  . S/P bilateral below knee amputation ([place]) 04/13/2017  . 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 Followed by Dr. Louanne Skye    Family History   family history includes Alcohol abuse in her father and mother; Breast cancer in her mother; Colon cancer (age of onset: 60) in her maternal aunt.  Prior Rehab/Hospitalizations Has the patient had prior rehab or hospitalizations prior to admission? Yes  Has the patient had major surgery during 100 days prior to admission? Yes   Current Medications  Current Facility-Administered Medications:  .  acetaminophen (TYLENOL) tablet 650 mg, 650 mg, Oral, Q6H PRN, Persons, Bevely Palmer, PA .  amLODipine (NORVASC) tablet 10 mg, 10 mg, Oral, Daily, Persons, Bevely Palmer, PA, 10 mg at 11/10/19 1004 .  diphenhydrAMINE (BENADRYL) capsule 25 mg, 25 mg, Oral, Q6H PRN, Kirby-Graham, Karsten Fells, NP, 25 mg at 11/09/19 2207 .  docusate  sodium (COLACE) capsule 100 mg, 100 mg, Oral, BID, Persons, Bevely Palmer, PA, 100 mg at 11/10/19 1004 .  doxycycline (VIBRA-TABS) tablet 100 mg, 100 mg, Oral, Q12H, Ghimire, Kuber, MD, 100 mg at 11/10/19 1004 .  enoxaparin (LOVENOX) injection 30 mg, 30 mg, Subcutaneous, Q24H, Persons, Bevely Palmer, [place], 30 mg at 11/09/19 2158 .  feeding supplement (ENSURE ENLIVE) (ENSURE ENLIVE) liquid 237 mL, 237 mL, Oral, BID BM, Persons, Bevely Palmer, PA, 237 mL at 11/10/19 1004 .  HYDROcodone-acetaminophen (NORCO/VICODIN) 5-325 MG per tablet 1 tablet, 1 tablet, Oral, Q6H PRN, Persons, Bevely Palmer, [place], 1 tablet at 11/10/19 [phone] .  labetalol (NORMODYNE) injection 10 mg, 10 mg, Intravenous, Q2H PRN, Persons, Bevely Palmer, PA .  lactated ringers infusion, , Intravenous, Continuous, Persons, Bevely Palmer, [place], Last Rate: 10 mL/hr at 11/08/19 0903, Restarted at 11/08/19 1010 .  losartan (COZAAR) tablet 25 mg, 25 mg, Oral, Daily, Persons, Bevely Palmer, PA, 25 mg at 11/10/19 1004 .  magnesium oxide (MAG-OX) tablet 400 mg, 400 mg, Oral, BID, Barb Merino, MD, 400 mg at 11/10/19 1004 .  MEDLINE mouth rinse, 15 mL, Mouth Rinse, BID, Persons, Bevely Palmer, PA, 15 mL at 11/10/19 1013 .  metoCLOPramide (REGLAN) tablet 5-10 mg, 5-10 mg, Oral, Q8H PRN **OR** metoCLOPramide (REGLAN) injection 5-10 mg, 5-10 mg, Intravenous, Q8H PRN, Persons, Bevely Palmer, PA .  multivitamin with minerals tablet 1 tablet, 1 tablet, Oral, Daily, Persons, Bevely Palmer, [place], 1 tablet at 11/10/19 1004 .  ondansetron (ZOFRAN) tablet 4 mg, 4 mg, Oral, Q6H PRN **OR** ondansetron (ZOFRAN) injection 4 mg, 4 mg, Intravenous, Q6H PRN, Persons, Bevely Palmer, PA .  oxyCODONE (Oxy IR/ROXICODONE) immediate release tablet 5-10 mg, 5-10 mg, Oral, Q4H PRN, Persons, Bevely Palmer, PA, 5 mg at 11/10/19 1012 .  polyethylene glycol (MIRALAX / GLYCOLAX) packet 17 g, 17 g, Oral, Daily, Persons, Bevely Palmer, [place], 17 g at 11/04/19 1859 .  potassium chloride SA (KLOR-CON) CR tablet 20 mEq, 20 mEq, Oral, BID,  Barb Merino, MD  Patients Current Diet:  Diet Order            Diet regular Room service appropriate? Yes; Fluid consistency: Thin  Diet effective now              Precautions / Restrictions Precautions Precautions: Fall Precaution Comments: wound vacs Restrictions Weight Bearing Restrictions: Yes RLE Weight Bearing: Non weight bearing LLE  Weight Bearing: Non weight bearing   Has the patient had 2 or more falls or a fall with injury in the past year? Yes  Prior Activity Level Community (5-7x/wk): mod I w/c level, B AKA PTA, living in a boarding house that her brother manages and either he or other residents would bump her up the stairs  Prior Functional Level Self Care: Did the patient need help bathing, dressing, using the toilet or eating? Independent  Indoor Mobility: Did the patient need assistance with walking from room to room (with or without device)? Independent (w/c level)  Stairs: Did the patient need assistance with internal or external stairs (with or without device)? Dependent  (w/c level)   Functional Cognition: Did the patient need help planning regular tasks such as shopping or remembering to take medications? Pt non-compliant.    Home Assistive Devices / Equipment Home Assistive Devices/Equipment: Bedside commode/3-in-1, Blood pressure cuff, Built-in shower seat, Dentures (specify type), Eyeglasses, Nebulizer, Raised toilet seat with rails, Shower chair with back, Wheelchair(Per patient (?poor historian))  Prior Device Use: Indicate devices/aids used by the patient prior to current illness, exacerbation or injury? Manual wheelchair and Motorized wheelchair or scooter  Current Functional Level Cognition  Overall Cognitive Status: Impaired/Different from baseline Orientation Level: Oriented X4 Following Commands: Follows one step commands consistently, Follows one step commands with increased time General Comments: pt drowsy upon arrival to room, but  wakes with PT entrance and discussion. Pt states "it's 2020 something" when asked the year, and does not recall procedure yesterday. Pt requires increased time to follow commands, and requires multimodal cuing to mobilize.    Extremity Assessment (includes Sensation/Coordination)  Upper Extremity Assessment: Generalized weakness  Lower Extremity Assessment: Defer to PT evaluation    ADLs  Overall ADL's : Needs assistance/impaired Eating/Feeding: Set up, Sitting, Bed level Eating/Feeding Details (indicate cue type and reason): HOB raised Grooming: Wash/dry hands, Wash/dry face, Set up, Bed level Upper Body Bathing: Moderate assistance, Bed level Lower Body Bathing: Maximal assistance, Bed level Upper Body Dressing : Moderate assistance, Bed level Lower Body Dressing: Total assistance, Bed level Toileting- Clothing Manipulation and Hygiene: Total assistance, Bed level General ADL Comments: unabel to complete long sitting in bed    Mobility  Overal bed mobility: Needs Assistance Bed Mobility: Supine to Sit, Sit to Supine, Rolling Rolling: Mod assist Supine to sit: Min assist, HOB elevated Sit to supine: Min assist, HOB elevated General bed mobility comments: min A to long sit in bed using rails, however pt long sat up about half way and was unable to complete due to pain    Transfers  General transfer comment: pt declined - severe pain with mobility this session    Ambulation / Gait / Stairs / Wheelchair Mobility  Ambulation/Gait General Gait Details: unable    Posture / Balance Dynamic Sitting Balance Sitting balance - Comments: unable to assess due to pain Balance Overall balance assessment: Needs assistance Sitting-balance support: Bilateral upper extremity supported Sitting balance-Leahy Scale: Fair Sitting balance - Comments: unable to assess due to pain    Special needs/care consideration BiPAP/CPAP no CPM on Continuous Drip IV no Dialysis no        Days n/a Life Vest  no Oxygen no Special Bed no Trach Size no Wound Vac (area) yes      Location bilateral AKA residual limbs Skin bilat AKA incision  Bowel mgmt: continent Bladder mgmt: continent Diabetic mgmt: no Behavioral consideration active polysubstance abuse Chemo/radiation no   Previous Home Environment (from acute therapy documentation) Home Care Services: Other (Comment)(No med staff; just 5 other people live there.run by brother) Additional Comments: pt states she has a few steps to enter home, but she bumps up steps with w/c and assist of brother/boarding house employees.  Discharge Living Setting Plans for Discharge Living Setting: Other (Comment)(boarding house) Type of Home at Discharge: Other (Comment) Discharge Home Layout: Multi-level, Able to live on main level with bedroom/bathroom Discharge Home Access: Stairs to enter Entrance Stairs-Rails: Left Entrance Stairs-Number of Steps: 3 Discharge Bathroom Shower/Tub: Tub/shower unit, Curtain Discharge Bathroom Toilet: Standard Discharge Bathroom Accessibility: Yes How Accessible: Accessible via wheelchair Does the patient have any problems obtaining your medications?: No  Social/Family/Support Systems Anticipated Caregiver: mod I goals, brother, [name] manages the boarding house she lives in Anticipated Caregiver's Contact Information: [name] [phone] Ability/Limitations of Caregiver: works from boarding house Caregiver Availability: Intermittent Discharge Plan Discussed with Primary Caregiver: Yes Is Caregiver In [place] with Plan?: Yes  Goals/Additional Needs Patient/Family Goal for Rehab: PT/OT mod I Expected length of stay: 5-7 days Pt/Family Agrees to Admission and willing to participate: Yes Program Orientation Provided & Reviewed with Pt/Caregiver Including Roles  & Responsibilities: Yes  Barriers to Discharge: [place], Insurance for SNF coverage, Medication compliance  Decrease burden  of Care through IP rehab admission: n/a  Possible need for SNF placement upon discharge: Not anticipated.  Pt mod I prior to admission and with no change in cognition since that time.   Patient Condition: I have reviewed medical records from [facility], spoken with CM, and patient. I met with patient at the bedside for inpatient rehabilitation assessment.  Patient will benefit from ongoing PT and OT, can actively participate in 3 hours of therapy a day 5 days of the week, and can make measurable gains during the admission.  Patient will also benefit from the coordinated team approach during an Inpatient Acute Rehabilitation admission.  The patient will receive intensive therapy as well as Rehabilitation physician, nursing, social worker, and care management interventions.  Due to safety, skin/wound care, medication administration, pain management and patient education the patient requires 24 hour a day rehabilitation nursing.  The patient is currently min/mod assist with mobility and basic ADLs.  Discharge setting and therapy post discharge at home is anticipated.  Patient has agreed to participate in the Acute Inpatient Rehabilitation Program and will admit today.  Preadmission Screen Completed By:  Michel Santee, PT, DPT 11/10/2019 11:53 AM ______________________________________________________________________   Discussed status with Dr. Posey Pronto on 11/10/19  at 12:11 PM  and received approval for admission today.  Admission Coordinator:  Michel Santee, PT, DPT time 12:11 PM Sudie Grumbling 11/10/19    Assessment/Plan: Diagnosis: B/l AKA revision 1. Does the need for close, 24 hr/day Medical supervision in concert with the patient's rehab needs make it unreasonable for this patient to be served in a less intensive setting? Potentially 2. 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 Due to safety, skin/wound care, disease management, pain management and patient education, does the patient require 24 hr/day rehab nursing? Potentially 4. Does the patient require coordinated care of a physician, rehab nurse, PT, OT to address physical and functional deficits in the context  of the above medical diagnosis(es)? Potentially Addressing deficits in the following areas: balance, endurance, locomotion, strength, transferring, toileting and psychosocial support 5. Can the patient actively participate in an intensive therapy program of at least 3 hrs of therapy 5 days a week? Potentially 6. The potential for patient to make measurable gains while on inpatient rehab is excellent 7. Anticipated functional outcomes upon discharge from inpatient rehab: Mod I at wheelchair level PT, Mod I at wheelchair level OT, n/a SLP 8. Estimated rehab length of stay to reach the above functional goals is: 2-4 days. 9. Anticipated discharge destination: Home 10. Overall Rehab/Functional Prognosis: good   MD Signature: Delice Lesch, MD, ABPMR

## 2019-11-10 NOTE — Progress Notes (Signed)
PMR Admission Coordinator Pre-Admission Assessment  Patient: [name] is an 58 y.o., female MRN: [id number] DOB: 04/25/1961 Height: 5' 1.5" (156.2 cm) Weight: 40.5 kg  Insurance Information HMO:     PPO:      PCP:      IPA:      80/20:      OTHER:  PRIMARY: Medicaid [place]      Policy#: [id number]      Subscriber: pt Coverage code: MAD Eff. Date: active as of 11/10/19     Deduct:       Out of Pocket Max:       Life Max:  CIR:       SNF:  Outpatient:      Co-Pay:  Home Health:       Co-Pay:  DME:      Co-Pay:  Providers:  SECONDARY:       Policy#:       Subscriber:  CM Name:       Phone#:      Fax#:  Pre-Cert#:       Employer:  Benefits:  Phone #:      Name:  Eff. Date:      Deduct:       Out of Pocket Max:       Life Max:  CIR:       SNF:  Outpatient:      Co-Pay:  Home Health:       Co-Pay:  DME:      Co-Pay:   Medicaid Application Date:       Case Manager:  Disability Application Date:       Case Worker:   The "Data Collection Information Summary" for patients in Inpatient Rehabilitation Facilities with attached "Privacy Act Statement-Health Care Records" was provided and verbally reviewed with: N/A  Emergency Contact Information Contact Information    Name Relation Home Work Mobile   McNair,[name] Brother   [phone]      Current Medical History  Patient Admitting Diagnosis: bilat AKA revision   History of Present Illness: [name] is a 58 year old female with history of HTN, cocaine induced vasculopathy, medical non-compliance,  levamisole induced vasculitis, s/p B-AKA in 2018, 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 UDS positive for cocaine and opiates.  She was started on broad spectrum antibiotics for cellulitis and MRI right thigh showed marked soft tissue edema with ascites within visualized portion of  pelvis--no evidence of osteomyelitis.   She was taken to OR on 2/03 for I&D of necrotic tissue and revision of AKA bilaterally with placement of Prevana VAC by Dr. Duda. Post op therapy evaluations done revealing limitation in mobility and ADLs due to pain and weakness. CIR recommended due to functional decline.   Patient's medical record from [facility] has been reviewed by the rehabilitation admission coordinator and physician.  Past Medical History  Past Medical History:  Diagnosis Date  . CAP (community acquired pneumonia) 03/2014 X 2  . Cocaine abuse (HCC)    ongoing with resultant vaculitis.  . Depression   . Headache    "weekly" (07/29/2016)  . Hypertension   . Inflammatory arthritis   . Migraines    "probably 5-6/yr" (07/29/2016)  . Normocytic anemia    BL Hgb 9.8-12. Last anemia panel 04/2010 - showing Fe 19, ferritin   101.  Pt on monthly B12 injections  . Oth psychoactive substance abuse w mood disorder (HCC) 2014  . Rheumatoid arthritis(714.0)    patient reported  . S/P bilateral below knee amputation (HCC) 04/13/2017  . 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 Followed by Dr. Nitka    Family History   family history includes Alcohol abuse in her father and mother; Breast cancer in her mother; Colon cancer (age of onset: 50) in her maternal aunt.  Prior Rehab/Hospitalizations Has the patient had prior rehab or hospitalizations prior to admission? Yes  Has the patient had major surgery during 100 days prior to admission? Yes   Current Medications  Current Facility-Administered Medications:  .  acetaminophen (TYLENOL) tablet 650 mg, 650 mg, Oral, Q6H PRN, Persons, Mary Anne, PA .  amLODipine (NORVASC) tablet 10 mg, 10 mg, Oral, Daily, Persons, Mary Anne, PA, 10 mg at 11/10/19 1004 .  diphenhydrAMINE (BENADRYL) capsule 25 mg, 25 mg, Oral, Q6H PRN, Kirby-Graham, Mylah J, NP, 25 mg at 11/09/19 2207 .  docusate  sodium (COLACE) capsule 100 mg, 100 mg, Oral, BID, Persons, Mary Anne, PA, 100 mg at 11/10/19 1004 .  doxycycline (VIBRA-TABS) tablet 100 mg, 100 mg, Oral, Q12H, Ghimire, Kuber, MD, 100 mg at 11/10/19 1004 .  enoxaparin (LOVENOX) injection 30 mg, 30 mg, Subcutaneous, Q24H, Persons, Mary Anne, PA, 30 mg at 11/09/19 2158 .  feeding supplement (ENSURE ENLIVE) (ENSURE ENLIVE) liquid 237 mL, 237 mL, Oral, BID BM, Persons, Mary Anne, PA, 237 mL at 11/10/19 1004 .  HYDROcodone-acetaminophen (NORCO/VICODIN) 5-325 MG per tablet 1 tablet, 1 tablet, Oral, Q6H PRN, Persons, Mary Anne, PA, 1 tablet at 11/10/19 0523 .  labetalol (NORMODYNE) injection 10 mg, 10 mg, Intravenous, Q2H PRN, Persons, Mary Anne, PA .  lactated ringers infusion, , Intravenous, Continuous, Persons, Mary Anne, PA, Last Rate: 10 mL/hr at 11/08/19 0903, Restarted at 11/08/19 1010 .  losartan (COZAAR) tablet 25 mg, 25 mg, Oral, Daily, Persons, Mary Anne, PA, 25 mg at 11/10/19 1004 .  magnesium oxide (MAG-OX) tablet 400 mg, 400 mg, Oral, BID, Ghimire, Kuber, MD, 400 mg at 11/10/19 1004 .  MEDLINE mouth rinse, 15 mL, Mouth Rinse, BID, Persons, Mary Anne, PA, 15 mL at 11/10/19 1013 .  metoCLOPramide (REGLAN) tablet 5-10 mg, 5-10 mg, Oral, Q8H PRN **OR** metoCLOPramide (REGLAN) injection 5-10 mg, 5-10 mg, Intravenous, Q8H PRN, Persons, Mary Anne, PA .  multivitamin with minerals tablet 1 tablet, 1 tablet, Oral, Daily, Persons, Mary Anne, PA, 1 tablet at 11/10/19 1004 .  ondansetron (ZOFRAN) tablet 4 mg, 4 mg, Oral, Q6H PRN **OR** ondansetron (ZOFRAN) injection 4 mg, 4 mg, Intravenous, Q6H PRN, Persons, Mary Anne, PA .  oxyCODONE (Oxy IR/ROXICODONE) immediate release tablet 5-10 mg, 5-10 mg, Oral, Q4H PRN, Persons, Mary Anne, PA, 5 mg at 11/10/19 1012 .  polyethylene glycol (MIRALAX / GLYCOLAX) packet 17 g, 17 g, Oral, Daily, Persons, Mary Anne, PA, 17 g at 11/04/19 1859 .  potassium chloride SA (KLOR-CON) CR tablet 20 mEq, 20 mEq, Oral, BID,  Ghimire, Kuber, MD  Patients Current Diet:  Diet Order            Diet regular Room service appropriate? Yes; Fluid consistency: Thin  Diet effective now              Precautions / Restrictions Precautions Precautions: Fall Precaution Comments: wound vacs Restrictions Weight Bearing Restrictions: Yes RLE Weight Bearing: Non weight bearing LLE   Weight Bearing: Non weight bearing   Has the patient had 2 or more falls or a fall with injury in the past year? Yes  Prior Activity Level Community (5-7x/wk): mod I w/c level, B AKA PTA, living in a boarding house that her brother manages and either he or other residents would bump her up the stairs  Prior Functional Level Self Care: Did the patient need help bathing, dressing, using the toilet or eating? Independent  Indoor Mobility: Did the patient need assistance with walking from room to room (with or without device)? Independent (w/c level)  Stairs: Did the patient need assistance with internal or external stairs (with or without device)? Dependent  (w/c level)   Functional Cognition: Did the patient need help planning regular tasks such as shopping or remembering to take medications? Pt non-compliant.    Home Assistive Devices / Equipment Home Assistive Devices/Equipment: Bedside commode/3-in-1, Blood pressure cuff, Built-in shower seat, Dentures (specify type), Eyeglasses, Nebulizer, Raised toilet seat with rails, Shower chair with back, Wheelchair(Per patient (?poor historian))  Prior Device Use: Indicate devices/aids used by the patient prior to current illness, exacerbation or injury? Manual wheelchair and Motorized wheelchair or scooter  Current Functional Level Cognition  Overall Cognitive Status: Impaired/Different from baseline Orientation Level: Oriented X4 Following Commands: Follows one step commands consistently, Follows one step commands with increased time General Comments: pt drowsy upon arrival to room, but  wakes with PT entrance and discussion. Pt states "it's 2020 something" when asked the year, and does not recall procedure yesterday. Pt requires increased time to follow commands, and requires multimodal cuing to mobilize.    Extremity Assessment (includes Sensation/Coordination)  Upper Extremity Assessment: Generalized weakness  Lower Extremity Assessment: Defer to PT evaluation    ADLs  Overall ADL's : Needs assistance/impaired Eating/Feeding: Set up, Sitting, Bed level Eating/Feeding Details (indicate cue type and reason): HOB raised Grooming: Wash/dry hands, Wash/dry face, Set up, Bed level Upper Body Bathing: Moderate assistance, Bed level Lower Body Bathing: Maximal assistance, Bed level Upper Body Dressing : Moderate assistance, Bed level Lower Body Dressing: Total assistance, Bed level Toileting- Clothing Manipulation and Hygiene: Total assistance, Bed level General ADL Comments: unabel to complete long sitting in bed    Mobility  Overal bed mobility: Needs Assistance Bed Mobility: Supine to Sit, Sit to Supine, Rolling Rolling: Mod assist Supine to sit: Min assist, HOB elevated Sit to supine: Min assist, HOB elevated General bed mobility comments: min A to long sit in bed using rails, however pt long sat up about half way and was unable to complete due to pain    Transfers  General transfer comment: pt declined - severe pain with mobility this session    Ambulation / Gait / Stairs / Wheelchair Mobility  Ambulation/Gait General Gait Details: unable    Posture / Balance Dynamic Sitting Balance Sitting balance - Comments: unable to assess due to pain Balance Overall balance assessment: Needs assistance Sitting-balance support: Bilateral upper extremity supported Sitting balance-Leahy Scale: Fair Sitting balance - Comments: unable to assess due to pain    Special needs/care consideration BiPAP/CPAP no CPM on Continuous Drip IV no Dialysis no        Days n/a Life Vest  no Oxygen no Special Bed no Trach Size no Wound Vac (area) yes      Location bilateral AKA residual limbs Skin bilat AKA incision                             Bowel mgmt: continent Bladder mgmt: continent Diabetic mgmt: no Behavioral consideration active polysubstance abuse Chemo/radiation no   Previous Home Environment (from acute therapy documentation) Home Care Services: Other (Comment)(No med staff; just 5 other people live there.run by brother) Additional Comments: pt states she has a few steps to enter home, but she bumps up steps with w/c and assist of brother/boarding house employees.  Discharge Living Setting Plans for Discharge Living Setting: Other (Comment)(boarding house) Type of Home at Discharge: Other (Comment) Discharge Home Layout: Multi-level, Able to live on main level with bedroom/bathroom Discharge Home Access: Stairs to enter Entrance Stairs-Rails: Left Entrance Stairs-Number of Steps: 3 Discharge Bathroom Shower/Tub: Tub/shower unit, Curtain Discharge Bathroom Toilet: Standard Discharge Bathroom Accessibility: Yes How Accessible: Accessible via wheelchair Does the patient have any problems obtaining your medications?: No  Social/Family/Support Systems Anticipated Caregiver: mod I goals, brother, [name] manages the boarding house she lives in Anticipated Caregiver's Contact Information: [name] [phone] Ability/Limitations of Caregiver: works from boarding house Caregiver Availability: Intermittent Discharge Plan Discussed with Primary Caregiver: Yes Is Caregiver In Agreement with Plan?: Yes  Goals/Additional Needs Patient/Family Goal for Rehab: PT/OT mod I Expected length of stay: 5-7 days Pt/Family Agrees to Admission and willing to participate: Yes Program Orientation Provided & Reviewed with Pt/Caregiver Including Roles  & Responsibilities: Yes  Barriers to Discharge: Wound Care, Insurance for SNF coverage, Medication compliance  Decrease burden  of Care through IP rehab admission: n/a  Possible need for SNF placement upon discharge: Not anticipated.  Pt mod I prior to admission and with no change in cognition since that time.   Patient Condition: I have reviewed medical records from [facility], spoken with CM, and patient. I met with patient at the bedside for inpatient rehabilitation assessment.  Patient will benefit from ongoing PT and OT, can actively participate in 3 hours of therapy a day 5 days of the week, and can make measurable gains during the admission.  Patient will also benefit from the coordinated team approach during an Inpatient Acute Rehabilitation admission.  The patient will receive intensive therapy as well as Rehabilitation physician, nursing, social worker, and care management interventions.  Due to safety, skin/wound care, medication administration, pain management and patient education the patient requires 24 hour a day rehabilitation nursing.  The patient is currently min/mod assist with mobility and basic ADLs.  Discharge setting and therapy post discharge at home is anticipated.  Patient has agreed to participate in the Acute Inpatient Rehabilitation Program and will admit today.  Preadmission Screen Completed By:  Kristyl Athens E Darchelle Nunes, PT, DPT 11/10/2019 11:53 AM ______________________________________________________________________   Discussed status with Dr. Patel on 11/10/19  at 12:11 PM  and received approval for admission today.  Admission Coordinator:  Asim Gersten E Adam Demary, PT, DPT time 12:11 PM /Date 11/10/19    Assessment/Plan: Diagnosis: B/l AKA revision 1. Does the need for close, 24 hr/day Medical supervision in concert with the patient's rehab needs make it unreasonable for this patient to be served in a less intensive setting? Potentially 2. 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 Due to safety, skin/wound care, disease management, pain management and patient education, does the patient require 24 hr/day rehab nursing? Potentially 4. Does the patient require coordinated care of a physician, rehab nurse, PT, OT to address physical and functional deficits in the context   of the above medical diagnosis(es)? Potentially Addressing deficits in the following areas: balance, endurance, locomotion, strength, transferring, toileting and psychosocial support 5. Can the patient actively participate in an intensive therapy program of at least 3 hrs of therapy 5 days a week? Potentially 6. The potential for patient to make measurable gains while on inpatient rehab is excellent 7. Anticipated functional outcomes upon discharge from inpatient rehab: Mod I at wheelchair level PT, Mod I at wheelchair level OT, n/a SLP 8. Estimated rehab length of stay to reach the above functional goals is: 2-4 days. 9. Anticipated discharge destination: Home 10. Overall Rehab/Functional Prognosis: good   MD Signature: Ankit Patel, MD, ABPMR 

## 2019-11-10 NOTE — H&P (Addendum)
Physical Medicine and Rehabilitation Admission H&P    Chief Complaint  Patient presents with  . Functional decline due to wound infections  . History of B-AKA    HPI: [name]. [name] is a 59 year old female with history of HTN, cocaine induced vasculopathy, medical non-compliance,  levamisole induced vasculitis, s/p B-AKA who was admitted on 11/04/2019 with pain in b/l stumps, multiple skin lesions, purulent drainage from R-AKA and painful ulcer L-AKA. History taken from chart review and patient. UDS positive for opiates and cocaine.  She was started on broad spectrum antibiotics for sepsis due to cellulitis and MRI right thigh showed marked soft tissue edema with ascites within visualized portion of pelvis--no evidence of osteomyelitis.   She was taken to OR on 11/08/2019 for I&D of necrotic tissue with placement of Prevana VAC by Dr. Sharol Given. Antibiotics narrowed to doxycycline post procedure with recommendations to continue for 7 total day treatment. Post op therapy evaluations done revealing limitation in mobility and ADLs due to pain and weakness. CIR recommended due to functional decline. Please see preadmission assessment from earlier today as well.    Review of Systems  Constitutional: Positive for malaise/fatigue. Negative for chills and fever.  HENT: Negative for hearing loss and tinnitus.   Eyes: Negative for blurred vision and double vision.  Respiratory: Negative for cough and shortness of breath.   Cardiovascular: Negative for chest pain and palpitations.  Gastrointestinal: Negative for abdominal pain, constipation, heartburn and nausea.  Genitourinary: Negative for dysuria and urgency.  Musculoskeletal: Positive for joint pain and myalgias. Negative for back pain.  Skin: Negative for rash.  Neurological: Positive for weakness. Negative for dizziness.  Psychiatric/Behavioral: The patient does not have insomnia.       Past Medical History:  Diagnosis Date  . CAP (community  acquired pneumonia) 03/2014 X 2  . Cocaine abuse ([place])    ongoing with resultant vaculitis.  . Depression   . Headache    "weekly" (07/29/2016)  . Hypertension   . Inflammatory arthritis   . Migraines    "probably 5-6/yr" (07/29/2016)  . Normocytic anemia    BL Hgb 9.8-12. Last anemia panel 04/2010 - showing Fe 19, ferritin 101.  Pt on monthly B12 injections  . Oth psychoactive substance abuse w mood disorder ([place]) 2014  . Rheumatoid arthritis(714.0)    patient reported  . S/P bilateral below knee amputation ([place]) 04/13/2017  . 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 Followed by Dr. Louanne Skye    Past Surgical History:  Procedure Laterality Date  . AMPUTATION Left 05/22/2016   Procedure: AMPUTATION LEFT LONG FINGER;  Surgeon: Marybelle Killings, MD;  Location: [facility];  Service: Orthopedics;  Laterality: Left;  . AMPUTATION Bilateral 04/10/2017   Procedure: AMPUTATION BELOW KNEE;  Surgeon: Newt Minion, MD;  Location: [facility];  Service: Orthopedics;  Laterality: Bilateral;  . AMPUTATION Bilateral 02/06/2018   Procedure: AMPUTATION ABOVE KNEE;  Surgeon: Newt Minion, MD;  Location: [facility];  Service: Orthopedics;  Laterality: Bilateral;  . HERNIA REPAIR     "stomach"  . I & D EXTREMITY Right 09/26/2015   Procedure: IRRIGATION AND DEBRIDEMENT LEG WOUND  VAC PLACEMENT.;  Surgeon: Loel Lofty Dillingham, DO;  Location: [facility];  Service: Plastics;  Laterality: Right;  . INCISION AND DRAINAGE OF WOUND Bilateral 10/20/2016   Procedure: IRRIGATION AND DEBRIDEMENT WOUND BILATERAL;  Surgeon: Edrick Kins, DPM;  Location: [facility];  Service: Podiatry;  Laterality: Bilateral;  . IRRIGATION AND DEBRIDEMENT ABSCESS Bilateral 09/26/2013   Procedure: DEBRIDEMENT ULCERS BILATERAL THIGHS;  Surgeon: Gwenyth Ober, MD;  Location: [facility];  Service: General;  Laterality: Bilateral;  . SKIN BIOPSY Bilateral    shin nodules  . STUMP REVISION Bilateral 11/08/2019   Procedure:  REVISION BILATERAL ABOVE KNEE AMPUTATION;  Surgeon: Newt Minion, MD;  Location: [facility];  Service: Orthopedics;  Laterality: Bilateral;    Family History  Problem Relation Age of Onset  . Breast cancer Mother        Breast cancer  . Alcohol abuse Mother   . Colon cancer Maternal Aunt 49  . Alcohol abuse Father     Social History:  Lives in a boarding home with her brother. Uses motorized wheelchair. independnet for transfers and self care PTA. She reports that she has been smoking cigarettes --down to 3-4 per day. She has a 4.56 pack-year smoking history. She has never used smokeless tobacco. Per reports current drug use. Drugs: "Crack" cocaine and Cocaine. She reports that she does not drink alcohol.     Allergies  Allergen Reactions  . Levamisole     Levamisole is a chemical adulterant of cocaine which can cause cutaneous necrosis It is used as an Health and safety inspector in veterinary medicine  . Acetaminophen Swelling and Other (See Comments)    "Eyelid swelling" but apparently tolerates Percocet and Vicodin just fine...  . Haldol [Haloperidol Lactate]     Possible eye swelling was given together with Toradol unsure if caused the reaction  . Lisinopril     UNSPECIFIED REACTION   . Toradol [Ketorolac Tromethamine]     Eye swelling  . Tramadol Rash    Medications Prior to Admission  Medication Sig Dispense Refill  . [EXPIRED] doxycycline (VIBRAMYCIN) 100 MG capsule Take 1 capsule (100 mg total) by mouth 2 (two) times daily for 7 days. 14 capsule 0  . lidocaine (XYLOCAINE) 5 % ointment Apply 1 application topically as needed. 35.44 g 0  . amLODipine (NORVASC) 10 MG tablet Take 1 tablet (10 mg total) by mouth daily. (Patient not taking: Reported on 11/04/2019) 30 tablet 6  . nystatin cream (MYCOSTATIN) Apply to affected area 2 times daily (Patient not taking: Reported on 11/04/2019) 30 g 0  . oxyCODONE (OXY IR/ROXICODONE) 5 MG immediate release tablet Take 1 tablet (5 mg total) by  mouth every 6 (six) hours as needed for moderate pain. (Patient not taking: Reported on 11/04/2019) 15 tablet 0  . polyethylene glycol (MIRALAX / GLYCOLAX) 17 g packet Take 17 g by mouth daily. (Patient not taking: Reported on 11/04/2019) 14 each 0    Drug Regimen Review  Drug regimen was reviewed and remains appropriate with no significant issues identified  Home: Home Living Family/patient expects to be discharged to:: Other (Comment)(boarding house) Additional Comments: pt states she has a few steps to enter home, but she bumps up steps with w/c and assist of brother/boarding house employees.   Functional History: Prior Function Level of Independence: Independent with assistive device(s) ADL's / Homemaking Assistance Needed: reports that she is independent with bathing (sponge bathes), dressing and toileting. Says she eats take out for most meals Comments: Pt reports using w/c independently, performs AP transfers in and out of w/c without assist. Pt states she sponge bathes and her brother provides food for her.  Functional Status:  Mobility: Bed Mobility Overal bed mobility: Needs Assistance Bed Mobility: Supine to Sit, Sit to Supine, Rolling  Rolling: Mod assist Supine to sit: Min assist, HOB elevated Sit to supine: Min assist, HOB elevated General bed mobility comments: min A to long sit in bed using rails, however pt long sat up about half way and was unable to complete due to pain Transfers General transfer comment: pt declined - severe pain with mobility this session Ambulation/Gait General Gait Details: unable    ADL: ADL Overall ADL's : Needs assistance/impaired Eating/Feeding: Set up, Sitting, Bed level Eating/Feeding Details (indicate cue type and reason): HOB raised Grooming: Wash/dry hands, Wash/dry face, Set up, Bed level Upper Body Bathing: Moderate assistance, Bed level Lower Body Bathing: Maximal assistance, Bed level Upper Body Dressing : Moderate assistance,  Bed level Lower Body Dressing: Total assistance, Bed level Toileting- Clothing Manipulation and Hygiene: Total assistance, Bed level General ADL Comments: unabel to complete long sitting in bed  Cognition: Cognition Overall Cognitive Status: Impaired/Different from baseline Orientation Level: Oriented X4 Cognition Arousal/Alertness: Awake/alert Behavior During Therapy: WFL for tasks assessed/performed Overall Cognitive Status: Impaired/Different from baseline Area of Impairment: Orientation, Following commands, Problem solving Orientation Level: Disoriented to, Time, Situation Following Commands: Follows one step commands consistently, Follows one step commands with increased time Problem Solving: Slow processing, Difficulty sequencing, Requires verbal cues, Requires tactile cues General Comments: pt drowsy upon arrival to room, but wakes with PT entrance and discussion. Pt states "it's 2020 something" when asked the year, and does not recall procedure yesterday. Pt requires increased time to follow commands, and requires multimodal cuing to mobilize.   Blood pressure (!) 135/93, pulse 85, temperature 98.4 F (36.9 C), temperature source Oral, resp. rate 17, height 5' 1.5" (1.562 m), weight 40.5 kg, SpO2 96 %. Physical Exam  Nursing note and vitals reviewed. Constitutional: She is oriented to person, place, and time. She appears well-developed. She appears cachectic. She has a sickly appearance. No distress.  HENT:  Head: Normocephalic and atraumatic.  Eyes: EOM are normal. Right eye exhibits no discharge. Left eye exhibits no discharge.  Neck: No tracheal deviation present. No thyromegaly present.  Respiratory: Effort normal. No stridor. No respiratory distress.  GI: Soft. She exhibits no distension.  Musculoskeletal:     Comments: B/l AKA with edema and tenderness Left hand digit amputation  Neurological: She is alert and oriented to person, place, and time.  Motor: B/l UE: 4/5  proximal to distal B/l LE: HF 1/5 (pain inhibition)  Skin: She is not diaphoretic.  +Vac b/l stumps  Psychiatric: She has a normal mood and affect. Her behavior is normal.    Results for orders placed or performed during the hospital encounter of 11/04/19 (from the past 48 hour(s))  Basic metabolic panel     Status: Abnormal   Collection Time: 11/09/19  7:12 AM  Result Value Ref Range   Sodium 138 135 - 145 mmol/L   Potassium 3.4 (L) 3.5 - 5.1 mmol/L   Chloride 105 98 - 111 mmol/L   CO2 22 22 - 32 mmol/L   Glucose, Bld 160 (H) 70 - 99 mg/dL   BUN 15 6 - 20 mg/dL   Creatinine, Ser 0.85 0.44 - 1.00 mg/dL   Calcium 8.2 (L) 8.9 - 10.3 mg/dL   GFR calc non Af Amer >60 >60 mL/min   GFR calc Af Amer >60 >60 mL/min   Anion gap 11 5 - 15    Comment: Performed at [facility], [place] [address] [place], [place] [address]  Magnesium     Status: None   Collection  Time: 11/09/19  7:12 AM  Result Value Ref Range   Magnesium 1.8 1.7 - 2.4 mg/dL    Comment: Performed at [facility] [address] [place], [place] [address]  Phosphorus     Status: None   Collection Time: 11/09/19  7:12 AM  Result Value Ref Range   Phosphorus 2.9 2.5 - 4.6 mg/dL    Comment: Performed at [facility] [address], [place], [place] [address]   No results found.     Medical Problem List and Plan: 1.  Deficits with mobility, transfers, self-care secondary to bilateral AKA revision.  -patient may shower  -ELOS/Goals: 2-4 days/Mod I at wheelchair level. 2.  Antithrombotics: -DVT/anticoagulation:  Pharmaceutical: Lovenox  -antiplatelet therapy: N/A 3. Chronic pain/Pain Management: Hydrocodone prn.   Monitor with increased mobility 4. Mood: LCSW to follow for evaluation and support.   -antipsychotic agents: N/A 5. Neuropsych: This patient is capable of making decisions on her own behalf. 6. Skin/Wound Care: Monitor wounds for healing ---continue Ensure supplements due to malnourished  state.  7. Fluids/Electrolytes/Nutrition: Monitor I/O. CMP ordered.  8. HTN: Monitor BP tid--continue Norvasc.   Monitor with increased mobility. 9. Leucocytosis: Continue to monitor for signs of infection.  CBC ordered.  10. Hypokalemia: Resolving with addition of supplement.   CMP ordered 11.  Abnormal LFTs: Resolving.   CMP ordered 12. Tobacco use/Cocaine abuse: Continue to educate on importance of cessation.   Bary Leriche, PA-C 11/10/2019  I have personally performed a face to face diagnostic evaluation, including, but not limited to relevant history and physical exam findings, of this patient and developed relevant assessment and plan.  Additionally, I have reviewed and concur with the physician assistant's documentation above.  Delice Lesch, MD, ABPMR

## 2019-11-11 ENCOUNTER — Inpatient Hospital Stay (HOSPITAL_COMMUNITY): Payer: Medicaid Other | Admitting: Physical Therapy

## 2019-11-11 ENCOUNTER — Inpatient Hospital Stay (HOSPITAL_COMMUNITY): Payer: Medicaid Other

## 2019-11-11 DIAGNOSIS — R5381 Other malaise: Secondary | ICD-10-CM

## 2019-11-11 LAB — CBC WITH DIFFERENTIAL/PLATELET
Abs Immature Granulocytes: 0.12 10*3/uL — ABNORMAL HIGH (ref 0.00–0.07)
Basophils Absolute: 0 10*3/uL (ref 0.0–0.1)
Basophils Relative: 0 %
Eosinophils Absolute: 0.2 10*3/uL (ref 0.0–0.5)
Eosinophils Relative: 1 %
HCT: 32.7 % — ABNORMAL LOW (ref 36.0–46.0)
Hemoglobin: 10.1 g/dL — ABNORMAL LOW (ref 12.0–15.0)
Immature Granulocytes: 1 %
Lymphocytes Relative: 18 %
Lymphs Abs: 2.3 10*3/uL (ref 0.7–4.0)
MCH: 25.5 pg — ABNORMAL LOW (ref 26.0–34.0)
MCHC: 30.9 g/dL (ref 30.0–36.0)
MCV: 82.6 fL (ref 80.0–100.0)
Monocytes Absolute: 0.9 10*3/uL (ref 0.1–1.0)
Monocytes Relative: 7 %
Neutro Abs: 9.2 10*3/uL — ABNORMAL HIGH (ref 1.7–7.7)
Neutrophils Relative %: 73 %
Platelets: 263 10*3/uL (ref 150–400)
RBC: 3.96 MIL/uL (ref 3.87–5.11)
RDW: 19.1 % — ABNORMAL HIGH (ref 11.5–15.5)
WBC: 12.6 10*3/uL — ABNORMAL HIGH (ref 4.0–10.5)
nRBC: 0 % (ref 0.0–0.2)

## 2019-11-11 LAB — COMPREHENSIVE METABOLIC PANEL
ALT: 66 U/L — ABNORMAL HIGH (ref 0–44)
AST: 47 U/L — ABNORMAL HIGH (ref 15–41)
Albumin: 2.4 g/dL — ABNORMAL LOW (ref 3.5–5.0)
Alkaline Phosphatase: 78 U/L (ref 38–126)
Anion gap: 12 (ref 5–15)
BUN: 9 mg/dL (ref 6–20)
CO2: 25 mmol/L (ref 22–32)
Calcium: 9.1 mg/dL (ref 8.9–10.3)
Chloride: 98 mmol/L (ref 98–111)
Creatinine, Ser: 0.56 mg/dL (ref 0.44–1.00)
GFR calc Af Amer: >60 mL/min (ref >60)
GFR calc non Af Amer: >60 mL/min (ref >60)
Glucose, Bld: 101 mg/dL — ABNORMAL HIGH (ref 70–99)
Potassium: 4.3 mmol/L (ref 3.5–5.1)
Sodium: 135 mmol/L (ref 135–145)
Total Bilirubin: 1.9 mg/dL — ABNORMAL HIGH (ref 0.3–1.2)
Total Protein: 5.9 g/dL — ABNORMAL LOW (ref 6.5–8.1)

## 2019-11-11 MED ORDER — GABAPENTIN 300 MG PO CAPS
300.0000 mg | ORAL_CAPSULE | Freq: Every day | ORAL | Status: DC
  Administered 2019-11-11 – 2019-11-12 (×2): 300 mg via ORAL
  Filled 2019-11-11 (×2): qty 1

## 2019-11-11 MED ORDER — HYDROCODONE-ACETAMINOPHEN 5-325 MG PO TABS
1.0000 | ORAL_TABLET | ORAL | Status: DC | PRN
  Administered 2019-11-11 – 2019-11-14 (×10): 1 via ORAL
  Filled 2019-11-11 (×10): qty 1

## 2019-11-11 NOTE — Evaluation (Signed)
Physical Therapy Assessment and Plan  Patient Details  Name: [name] MRN: [id number] Date of Birth: [birth year]  PT Diagnosis: Impaired sensation, Muscle weakness and Pain in BLE Rehab Potential: Poor ELOS: 7-10 days   Today's Date: 11/11/2019 PT Individual Time: 1000- 1040 PT Individual Time Calculation (min): 40 min    Problem List:  Patient Active Problem List   Diagnosis Date Noted  . Debility 11/10/2019  . S/P AKA (above knee amputation) bilateral ([place]) 11/10/2019  . Wound infection   . Transaminitis   . Hypokalemia   . Leukocytosis   . Benign essential HTN   . Chronic pain syndrome   . Stump pain ([place]) 09/27/2019  . Anemia   . Cellulitis 09/26/2019  . Hyperglycemia 08/27/2019  . Disability of walking 05/28/2019  . At risk for adverse drug event 08/30/2018  . Microcytic hypochromic anemia 08/30/2018  . Thrombocytopenia ([place]) 08/20/2018  . Toxic encephalopathy 08/20/2018  . Goals of care, counseling/discussion   . Palliative care by specialist   . DNR (do not resuscitate) discussion   . Infiltrate noted on imaging study   . Ulcer of right thigh, limited to breakdown of skin ([place])   . S/P bilateral above knee amputation ([place])   . Homelessness 07/30/2018  . Elevated LFTs 07/30/2018  . Osteomyelitis ([place]) 07/30/2018  . Acute encephalopathy 07/30/2018  . Eye swelling, bilateral 07/30/2018  . Skin rash 07/11/2018  . Acute on chronic diastolic CHF (congestive heart failure) ([place]) 06/21/2018  . Malnutrition of moderate degree 05/20/2018  . Acute congestive heart failure ([place])   . Suicidal ideation 02/02/2018  . HCAP (healthcare-associated pneumonia) 02/02/2018  . Acute on chronic respiratory failure with hypoxia ([place]) 02/02/2018  . Sepsis ([place]) 02/02/2018  . Dehiscence of amputation stump ([place]) 10/23/2017  . Cocaine abuse with cocaine-induced mood disorder ([place]) 08/10/2017  . Hypertensive crisis   . Phantom limb pain ([place])   . Tobacco abuse   . Amputation stump  pain ([place])   . Acute blood loss anemia   . Atherosclerosis of native arteries of extremities with gangrene, bilateral legs ([place])   . AKI (acute kidney injury) ([place]) 02/07/2017  . Acute kidney injury ([place]) 02/06/2017  . Wound healing, delayed   . Chest pain 04/07/2015  . Protein-calorie malnutrition, severe ([place]) 01/15/2015  . Cocaine abuse ([place])   . MDD (major depressive disorder), recurrent episode, severe ([place]) 10/16/2014  . Cocaine use disorder, severe, dependence ([place]) 10/10/2014  . 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 Essential hypertension 02/26/2010    Past Medical History:  Past Medical History:  Diagnosis Date  . CAP (community acquired pneumonia) 03/2014 X 2  . Cocaine abuse ([place])    ongoing with resultant vaculitis.  . Depression   . Headache    "weekly" (07/29/2016)  . Hypertension   . Inflammatory arthritis   . Migraines    "probably 5-6/yr" (07/29/2016)  . Normocytic anemia    BL Hgb 9.8-12. Last anemia panel 04/2010 - showing Fe 19, ferritin 101.  Pt on monthly B12 injections  . Oth psychoactive substance abuse w mood disorder ([place]) 2014  . Rheumatoid arthritis(714.0)    patient reported  . S/P bilateral below knee amputation ([place]) 04/13/2017  . 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 Followed by Dr. Louanne Skye   Past Surgical History:  Past Surgical History:  Procedure Laterality Date  . AMPUTATION Left 05/22/2016   Procedure: AMPUTATION LEFT LONG FINGER;  Surgeon:  Marybelle Killings, MD;  Location: [facility];  Service: Orthopedics;  Laterality: Left;  . AMPUTATION Bilateral 04/10/2017   Procedure: AMPUTATION BELOW KNEE;  Surgeon: Newt Minion, MD;  Location: [facility];  Service: Orthopedics;  Laterality: Bilateral;  . AMPUTATION Bilateral 02/06/2018   Procedure: AMPUTATION ABOVE KNEE;  Surgeon: Newt Minion, MD;  Location: [facility];  Service: Orthopedics;  Laterality: Bilateral;  . HERNIA REPAIR      "stomach"  . I & D EXTREMITY Right 09/26/2015   Procedure: IRRIGATION AND DEBRIDEMENT LEG WOUND  VAC PLACEMENT.;  Surgeon: Loel Lofty Dillingham, DO;  Location: [facility];  Service: Plastics;  Laterality: Right;  . INCISION AND DRAINAGE OF WOUND Bilateral 10/20/2016   Procedure: IRRIGATION AND DEBRIDEMENT WOUND BILATERAL;  Surgeon: Edrick Kins, DPM;  Location: [facility];  Service: Podiatry;  Laterality: Bilateral;  . IRRIGATION AND DEBRIDEMENT ABSCESS Bilateral 09/26/2013   Procedure: DEBRIDEMENT ULCERS BILATERAL THIGHS;  Surgeon: Gwenyth Ober, MD;  Location: [facility];  Service: General;  Laterality: Bilateral;  . SKIN BIOPSY Bilateral    shin nodules  . STUMP REVISION Bilateral 11/08/2019   Procedure: REVISION BILATERAL ABOVE KNEE AMPUTATION;  Surgeon: Newt Minion, MD;  Location: [facility];  Service: Orthopedics;  Laterality: Bilateral;    Assessment & Plan Clinical Impression: Patient is a 59 year old female with history of HTN, cocaine induced vasculopathy, medical non-compliance,  levamisole induced vasculitis, s/p B-AKA who was admitted on 11/04/2019 with pain in b/l stumps, multiple skin lesions, purulent drainage from R-AKA and painful ulcer L-AKA. History taken from chart review and patient. UDS positive for opiates and cocaine.  She was started on broad spectrum antibiotics for sepsis due to cellulitis and MRI right thigh showed marked soft tissue edema with ascites within visualized portion of pelvis--no evidence of osteomyelitis.   She was taken to OR on 11/08/2019 for I&D of necrotic tissue with placement of Prevana VAC by Dr. Sharol Given. Antibiotics narrowed to doxycycline post procedure with recommendations to continue for 7 total day treatment. Post op therapy evaluations done revealing limitation in mobility and ADLs due to pain and weakness. CIR recommended due to functional decline.  Patient transferred to CIR on 11/10/2019 .   Patient currently requires total with mobility secondary to muscle weakness and  muscle joint tightness, decreased cardiorespiratoy endurance, decreased problem solving and delayed processing and decreased sitting balance, decreased balance strategies and difficulty maintaining precautions.  Prior to hospitalization, patient was modified independent  with mobility and lived with Family in a House home.  Home access is  Stairs to enter.  Patient will benefit from skilled PT intervention to maximize safe functional mobility, minimize fall risk and decrease caregiver burden for planned discharge home with 24 hour supervision.  Anticipate patient will benefit from follow up [place] at discharge.  PT - End of Session Activity Tolerance: Tolerates < 10 min activity with changes in vital signs Endurance Deficit: Yes PT Assessment Rehab Potential (ACUTE/IP ONLY): Poor PT Barriers to Discharge: [place] home environment;Decreased caregiver support;Medical stability;Home environment access/layout;IV antibiotics;Wound Care;Lack of/limited family support;Insurance for SNF coverage;Weight bearing restrictions;Medication compliance;Behavior PT Patient demonstrates impairments in the following area(s): Balance;Behavior;Edema;Endurance;Nutrition;Pain;Perception;Safety;Sensory;Skin Integrity PT Transfers Functional Problem(s): Bed Mobility;Bed to Chair;Car;Furniture;Floor PT Locomotion Functional Problem(s): Ambulation;Wheelchair Mobility;Stairs PT Plan PT Intensity: Minimum of 1-2 x/day ,45 to 90 minutes PT Frequency: 5 out of 7 days PT Duration Estimated Length of Stay: 7-10 days PT Treatment/Interventions: Balance/vestibular training;Cognitive remediation/compensation;Community reintegration;Discharge planning;Disease management/prevention;DME/adaptive equipment instruction;Neuromuscular re-education;Functional mobility training;Pain management;Patient/family education;Skin  care/wound management;Psychosocial support;Splinting/orthotics;Stair training;Therapeutic Activities;Therapeutic  Exercise;UE/LE Strength taining/ROM;UE/LE Coordination activities;Visual/perceptual remediation/compensation;Wheelchair propulsion/positioning PT Transfers Anticipated Outcome(s): supervision assist to transfer to [facility] PT Locomotion Anticipated Outcome(s): Mod I WC level PT Recommendation Follow Up Recommendations: Home health PT Patient destination: Home Equipment Recommended: To be determined  Skilled Therapeutic Intervention Pt received supine in bed. PT instructed patient in PT Evaluation and initiated treatment intervention; see below for results. PT educated patient in [place], rehab potential, rehab goals, and discharge recommendations. With much encouragement  Pt performed partial roll R and L with min assist for increased ROM each direction. Pt performed AROM hip flexion on the RLE, but requires assist from PT for hip flexion on the L, limited by pain. Pt initated partial supine>long sit, but unable to achieve full sitting due to BLE pain. Pt declined to attempt any additional bed mobility or transfers out of bed at this time due to pain. Significant education for importance of OOB mobility training, but pt continued to decline. Left sitting in bed with call bell in reach and all needs met.    PT Evaluation Precautions/Restrictions Precautions Precautions: None Precaution Comments: wound vacs Restrictions Weight Bearing Restrictions: Yes RLE Weight Bearing: Non weight bearing LLE Weight Bearing: Non weight bearing General PT Amount of Missed Time (min): 15 Minutes Vital Signs Pain Pain Assessment Pain Scale: 0-10 Pain Score: 8  Pain Type: Surgical pain Pain Orientation: Left Pain Descriptors / Indicators: Aching;Stabbing Home Living/Prior Functioning Home Living Available Help at Discharge: Family;Friend(s);Available 24 hours/day Type of Home: House Home Access: Stairs to enter Additional Comments: pt states she has a few steps to enter home, but she bumps up steps with w/c and  assist of brother/boarding house employees.  Lives With: Family Prior Function Comments: Pt reports using w/c independently, performs AP transfers in and out of w/c without assist. Pt states she sponge bathes and her brother provides food for her. Vision/Perception     WFL Cognition Overall Cognitive Status: No family/caregiver present to determine baseline cognitive functioning Arousal/Alertness: Awake/alert Orientation Level: Oriented to person;Oriented to place;Oriented to situation Attention: Sustained Sustained Attention: Appears intact Memory: (unable to assess-pt irritable and not answering/attempting questions) Immediate Memory Recall: Blue;Sock;Bed Memory Recall Sock: Not able to recall Memory Recall Blue: Not able to recall Memory Recall Bed: Not able to recall Awareness: Appears intact Safety/Judgment: Appears intact Sensation Sensation Light Touch: Impaired Detail Light Touch Impaired Details: Impaired RLE;Impaired LLE Additional Comments: no light touch in distal LE or near inscision site Coordination Gross Motor Movements are Fluid and Coordinated: No Coordination and Movement Description: limited by pain in BLE R>E Motor  Motor Motor: Within Functional Limits Motor - Skilled Clinical Observations: WFL limited by pain  Mobility Bed Mobility Bed Mobility: Rolling Right;Rolling Left;Supine to Sit Rolling Right: Minimal Assistance - Patient > 75% Rolling Left: Minimal Assistance - Patient > 75% Supine to Sit: (pt refused due to pain in BLE) Transfers Transfers: (Pt refused to transfer or come to sitting EOB) Locomotion  Gait Ambulation: No Gait Gait: No Stairs / Additional Locomotion Stairs: No Wheelchair Mobility Wheelchair Mobility: No(refused to attempt due to pain)  Trunk/Postural Assessment  Cervical Assessment Cervical Assessment: Within Functional Limits Thoracic Assessment Thoracic Assessment: Within Functional Limits Lumbar Assessment Lumbar  Assessment: Within Functional Limits Postural Control Postural Control: Within Functional Limits  Balance Balance Balance Assessed: No Dynamic Sitting Balance Sitting balance - Comments: unable to assess due to reported pain Extremity Assessment      RLE Assessment RLE Assessment: Exceptions to  [facility] General Strength Comments: grossly 3/5 at hip. AKA LLE Assessment LLE Assessment: Exceptions to [facility] General Strength Comments: 2/5 limited by pain at hip. AKA    Refer to Care Plan for Long Term Goals  Recommendations for other services: None   Discharge Criteria: Patient will be discharged from PT if patient refuses treatment 3 consecutive times without medical reason, if treatment goals not met, if there is a change in medical status, if patient makes no progress towards goals or if patient is discharged from hospital.  The above assessment, treatment plan, treatment alternatives and goals were discussed and mutually agreed upon: by patient  Lorie Phenix 11/11/2019, 11:02 AM

## 2019-11-11 NOTE — Progress Notes (Addendum)
[facility] PHYSICAL MEDICINE & REHABILITATION PROGRESS NOTE   Subjective/Complaints:  Pt reports to me that she's hungry and just "needs more food".  Told nurse she hurts so bad she refuses to do therapy- Said she can't even move at all.  Started Gabapentin 300 mg QHS for phantom pain and nerve pain as well as increased her Norco to q4 hours, however due to hx of polysubstance abuse don't feel comfortable increasing more right now- was abusing cocaine and opiates.    ROS:  Denies SOB, CP, abd pain; N/V/C/D, vision changes or headaches.   Objective:   No results found. No results for input(s): WBC, HGB, HCT, PLT in the last 72 hours. Recent Labs    11/09/19 0712  NA 138  K 3.4*  CL 105  CO2 22  GLUCOSE 160*  BUN 15  CREATININE 0.85  CALCIUM 8.2*    Intake/Output Summary (Last 24 hours) at 11/11/2019 1138 Last data filed at 11/11/2019 0818 Gross per 24 hour  Intake 240 ml  Output 0 ml  Net 240 ml     Physical Exam: Vital Signs Blood pressure 121/83, pulse 84, temperature (!) 97.5 F (36.4 C), resp. rate 19, SpO2 95 %.  Constitutional: laying on R side in bed; facing wall; didn't move until touched/tactile stimuli on her arm- "asleep"- then started yelling about being hungry; She appears cachectic. She has a sickly appearance. No distress.  HENT:  Head: Normocephalic and atraumatic.  Eyes: conjugate gaze Neck: No tracheal deviation present.  CV: borderline tachycardia when listened her Respiratory: CTA B/L GI: Soft. She exhibits no distension; NT, (+) hypoactive BS.  Musculoskeletal:     Comments: B/l AKA with edema and tenderness Left hand digit amputation  Neurological: She is alert and oriented to person, place, and time.  Motor: B/l UE: 4/5 proximal to distal B/l LE: HF 1/5 (pain inhibition)  Skin: She is not diaphoretic.  +Vac b/l stumps  Psychiatric: very irritable      Assessment/Plan: 1. Functional deficits secondary to B/L AKA revision to higher  level due to wound infections which require 3+ hours per day of interdisciplinary therapy in a comprehensive inpatient rehab setting.  Physiatrist is providing close team supervision and 24 hour management of active medical problems listed below.  Physiatrist and rehab team continue to assess barriers to discharge/monitor patient progress toward functional and medical goals  Care Tool:  Bathing    Body parts bathed by patient: Right arm, Left arm, Abdomen, Chest, Front perineal area, Face, Right upper leg, Left upper leg   Body parts bathed by helper: Buttocks Body parts n/a: Right lower leg, Left lower leg(B AKA)   Bathing assist Assist Level: Moderate Assistance - Patient 50 - 74%(bed level)     Upper Body Dressing/Undressing Upper body dressing   What is the patient wearing?: Hospital gown only    Upper body assist Assist Level: Moderate Assistance - Patient 50 - 74%    Lower Body Dressing/Undressing Lower body dressing    Lower body dressing activity did not occur: Safety/medical concerns(B wound vac, high AKA) What is the patient wearing?: Incontinence brief     Lower body assist Assist for lower body dressing: Maximal Assistance - Patient 25 - 49%     Toileting Toileting Toileting Activity did not occur (Clothing management and hygiene only): N/A (no void or bm)  Toileting assist Assist for toileting: Maximal Assistance - Patient 25 - 49%     Transfers Chair/bed transfer  Transfers assist  Chair/bed transfer activity did not occur: Refused        Locomotion Ambulation   Ambulation assist              Walk 10 feet activity   Assist           Walk 50 feet activity   Assist           Walk 150 feet activity   Assist           Walk 10 feet on uneven surface  activity   Assist           Wheelchair     Assist               Wheelchair 50 feet with 2 turns activity    Assist            Wheelchair  150 feet activity     Assist          Blood pressure 121/83, pulse 84, temperature (!) 97.5 F (36.4 C), resp. rate 19, SpO2 95 %.  Medical Problem List and Plan: 1.  Deficits with mobility, transfers, self-care secondary to bilateral AKA revision.             -patient may shower             -ELOS/Goals: 2-4 days/Mod I at wheelchair level. 2.  Antithrombotics: -DVT/anticoagulation:  Pharmaceutical: Lovenox             -antiplatelet therapy: N/A 3. Chronic pain/Pain Management: Hydrocodone prn.  2/6- increase Norco to q4 hours prn and added Gabapentin 300 mg QHS for nerve pain- can increase over next few days.               Monitor with increased mobility 4. Mood: LCSW to follow for evaluation and support.              -antipsychotic agents: N/A 5. Neuropsych: This patient is capable of making decisions on her own behalf. 6. Skin/Wound Care: Monitor wounds for healing - severe protein and calorie malnutrition---continue Ensure supplements due to malnourished state.  2/6- gave double portions/double protein due to cachetic state  7. Fluids/Electrolytes/Nutrition: Monitor I/O. CMP ordered.  8. HTN: Monitor BP tid--continue Norvasc.              Monitor with increased mobility. 9. Leucocytosis: Continue to monitor for signs of infection.             CBC ordered.  2/6- not done yet- will monitor 10. Hypokalemia: Resolving with addition of supplement.              CMP ordered  2/6- not done yet- will monitor 11.  Abnormal LFTs: Resolving.              CMP ordered  2/6- not done yet- will monitor 12. Tobacco use/Cocaine abuse: Continue to educate on importance of cessation.      LOS: 1 days A FACE TO FACE EVALUATION WAS PERFORMED  Deanthony Maull 11/11/2019, 11:38 AM

## 2019-11-11 NOTE — Evaluation (Signed)
Occupational Therapy Assessment and Plan  Patient Details  Name: [name] MRN: [id number] Date of Birth: 04/15/1961  OT Diagnosis: acute pain Rehab Potential: Rehab Potential (ACUTE ONLY): Poor ELOS: 7- 10 days(may need to be altered when participation increases)   Today's Date: 11/11/2019 OT Individual Time: 0730-0800 OT Individual Time Calculation (min): 30 min    30 min missed 2/2 pain/refusal   Problem List:  Patient Active Problem List   Diagnosis Date Noted  . Debility 11/10/2019  . S/P AKA (above knee amputation) bilateral ([place]) 11/10/2019  . Wound infection   . Transaminitis   . Hypokalemia   . Leukocytosis   . Benign essential HTN   . Chronic pain syndrome   . Stump pain ([place]) 09/27/2019  . Anemia   . Cellulitis 09/26/2019  . Hyperglycemia 08/27/2019  . Disability of walking 05/28/2019  . At risk for adverse drug event 08/30/2018  . Microcytic hypochromic anemia 08/30/2018  . Thrombocytopenia ([place]) 08/20/2018  . Toxic encephalopathy 08/20/2018  . Goals of care, counseling/discussion   . Palliative care by specialist   . DNR (do not resuscitate) discussion   . Infiltrate noted on imaging study   . Ulcer of right thigh, limited to breakdown of skin ([place])   . S/P bilateral above knee amputation ([place])   . Homelessness 07/30/2018  . Elevated LFTs 07/30/2018  . Osteomyelitis ([place]) 07/30/2018  . Acute encephalopathy 07/30/2018  . Eye swelling, bilateral 07/30/2018  . Skin rash 07/11/2018  . Acute on chronic diastolic CHF (congestive heart failure) ([place]) 06/21/2018  . Malnutrition of moderate degree 05/20/2018  . Acute congestive heart failure ([place])   . Suicidal ideation 02/02/2018  . HCAP (healthcare-associated pneumonia) 02/02/2018  . Acute on chronic respiratory failure with hypoxia ([place]) 02/02/2018  . Sepsis ([place]) 02/02/2018  . Dehiscence of amputation stump ([place]) 10/23/2017  . Cocaine abuse with cocaine-induced mood disorder ([place]) 08/10/2017  .  Hypertensive crisis   . Phantom limb pain ([place])   . Tobacco abuse   . Amputation stump pain ([place])   . Acute blood loss anemia   . Atherosclerosis of native arteries of extremities with gangrene, bilateral legs ([place])   . AKI (acute kidney injury) ([place]) 02/07/2017  . Acute kidney injury ([place]) 02/06/2017  . Wound healing, delayed   . Chest pain 04/07/2015  . Protein-calorie malnutrition, severe ([place]) 01/15/2015  . Cocaine abuse ([place])   . MDD (major depressive disorder), recurrent episode, severe ([place]) 10/16/2014  . Cocaine use disorder, severe, dependence ([place]) 10/10/2014  . Cocaine-induced vascular disorder ([place]) 06/19/2013  . Essential hypertension 02/26/2010    Past Medical History:  Past Medical History:  Diagnosis Date  . CAP (community acquired pneumonia) 03/2014 X 2  . Cocaine abuse ([place])    ongoing with resultant vaculitis.  . Depression   . Headache    "weekly" (07/29/2016)  . Hypertension   . Inflammatory arthritis   . Migraines    "probably 5-6/yr" (07/29/2016)  . Normocytic anemia    BL Hgb 9.8-12. Last anemia panel 04/2010 - showing Fe 19, ferritin 101.  Pt on monthly B12 injections  . Oth psychoactive substance abuse w mood disorder ([place]) 2014  . Rheumatoid arthritis(714.0)    patient reported  . S/P bilateral below knee amputation ([place]) 04/13/2017  . 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 Followed by Dr. Louanne Skye   Past Surgical History:  Past Surgical History:  Procedure Laterality Date  .  AMPUTATION Left 05/22/2016   Procedure: AMPUTATION LEFT LONG FINGER;  Surgeon: Marybelle Killings, MD;  Location: [facility];  Service: Orthopedics;  Laterality: Left;  . AMPUTATION Bilateral 04/10/2017   Procedure: AMPUTATION BELOW KNEE;  Surgeon: Newt Minion, MD;  Location: [facility];  Service: Orthopedics;  Laterality: Bilateral;  . AMPUTATION Bilateral 02/06/2018   Procedure: AMPUTATION ABOVE KNEE;  Surgeon: Newt Minion, MD;   Location: [facility];  Service: Orthopedics;  Laterality: Bilateral;  . HERNIA REPAIR     "stomach"  . I & D EXTREMITY Right 09/26/2015   Procedure: IRRIGATION AND DEBRIDEMENT LEG WOUND  VAC PLACEMENT.;  Surgeon: Loel Lofty Dillingham, DO;  Location: [facility];  Service: Plastics;  Laterality: Right;  . INCISION AND DRAINAGE OF WOUND Bilateral 10/20/2016   Procedure: IRRIGATION AND DEBRIDEMENT WOUND BILATERAL;  Surgeon: Edrick Kins, DPM;  Location: [facility];  Service: Podiatry;  Laterality: Bilateral;  . IRRIGATION AND DEBRIDEMENT ABSCESS Bilateral 09/26/2013   Procedure: DEBRIDEMENT ULCERS BILATERAL THIGHS;  Surgeon: Gwenyth Ober, MD;  Location: [facility];  Service: General;  Laterality: Bilateral;  . SKIN BIOPSY Bilateral    shin nodules  . STUMP REVISION Bilateral 11/08/2019   Procedure: REVISION BILATERAL ABOVE KNEE AMPUTATION;  Surgeon: Newt Minion, MD;  Location: [facility];  Service: Orthopedics;  Laterality: Bilateral;    Assessment & Plan Clinical Impression: [name]. [name] is a 59 year old female with history of HTN, cocaine induced vasculopathy, medical non-compliance,  levamisole induced vasculitis, s/p B-AKA who was admitted on 11/04/2019 with pain in b/l stumps, multiple skin lesions, purulent drainage from R-AKA and painful ulcer L-AKA. History taken from chart review and patient. UDS positive for opiates and cocaine.  She was started on broad spectrum antibiotics for sepsis due to cellulitis and MRI right thigh showed marked soft tissue edema with ascites within visualized portion of pelvis--no evidence of osteomyelitis.   She was taken to OR on 11/08/2019 for I&D of necrotic tissue with placement of Prevana VAC by Dr. Sharol Given. Antibiotics narrowed to doxycycline post procedure with recommendations to continue for 7 total day treatment. Post op therapy evaluations done revealing limitation in mobility and ADLs due to pain and weakness. CIR recommended due to functional decline. Please see preadmission  assessment from earlier today as well. .  Patient transferred to CIR on 11/10/2019 .    Patient currently requires min with basic self-care skills secondary to muscle weakness and decreased sitting balance, decreased postural control and decreased balance strategies.  Prior to hospitalization, patient could complete ADLs with modified independent .  Patient will benefit from skilled intervention to decrease level of assist with basic self-care skills prior to discharge home with care partner.  Anticipate patient will require intermittent supervision and follow up home health.  OT - End of Session Activity Tolerance: Tolerates 10 - 20 min activity with multiple rests Endurance Deficit: Yes Endurance Deficit Description: limited by pain and general deconditioning OT Assessment Rehab Potential (ACUTE ONLY): Poor OT Barriers to Discharge: Inaccessible home environment OT Barriers to Discharge Comments: states that a door was used to get power chair up stairs OT Patient demonstrates impairments in the following area(s): Balance;Motor;Sensory;Skin Integrity;Pain;Cognition;Safety;Endurance OT Basic ADL's Functional Problem(s): Bathing;Dressing;Toileting OT Transfers Functional Problem(s): Toilet OT Additional Impairment(s): None OT Plan OT Intensity: Minimum of 1-2 x/day, 45 to 90 minutes OT Frequency: 5 out of 7 days OT Duration/Estimated Length of Stay: 7- 10 days(may need to be altered when participation increases) OT Treatment/Interventions: Medical illustrator  training;Community reintegration;Disease mangement/prevention;Patient/family education;Self Care/advanced ADL retraining;Therapeutic Exercise;UE/LE Coordination activities;Therapeutic Activities;Wheelchair propulsion/positioning;Cognitive remediation/compensation;Discharge planning;DME/adaptive equipment instruction;Functional mobility training;Pain management;Psychosocial support;Skin care/wound managment OT Self Feeding Anticipated  Outcome(s): no goal set OT Basic Self-Care Anticipated Outcome(s): (S) OT Toileting Anticipated Outcome(s): (S) OT Bathroom Transfers Anticipated Outcome(s): (S) OT Recommendation Patient destination: Home Follow Up Recommendations: Home health OT Equipment Recommended: To be determined   Skilled Therapeutic Intervention Skilled OT evaluation initiation. Pt with no pain reported at start of session. Pt distractible by TV and refused to allow OT to turn off. Initiated edu of OT POC, ELOS, and rehab expectations, pt uninterested and stating "I didn't agree to that." Pt adamantly refused any bed mobility other than rolling R and L (min A) or transfers. Pt reported high pain once she initiated rolling and was crying/unable to be consoled. Pt completed bed level ADLs as described below. Pt refused to participate in cognitive testing- refusing to answer any questions re year/month/date.  As of the time of evaluation, OT remains guarded on pt appropriateness for rehab. Suspect pt can perform transfers without high amounts of assistance but pt is very resistant to participation in therapy and when informed on 3 hrs/day rehab expectation pt reports she did not agree to this nor was she aware. Care coordination will be completed with rehab team to determine next steps to ensure safe d/c to next level of care.   OT Evaluation Precautions/Restrictions  Precautions Precautions: Fall Precaution Comments: B wound vacs Restrictions Weight Bearing Restrictions: Yes RLE Weight Bearing: Non weight bearing LLE Weight Bearing: Non weight bearing General Chart Reviewed: Yes OT Amount of Missed Time: 30 Minutes Family/Caregiver Present: No  Pain Pain Assessment Pain Scale: Faces Pain Score: 8  Faces Pain Scale: Hurts whole lot Pain Type: Acute pain Pain Location: Incision Pain Orientation: Right;Left Pain Descriptors / Indicators: Aching Pain Frequency: Constant Pain Onset: With Activity Pain  Intervention(s): Repositioned;Rest Home Living/Prior Functioning Home Living Available Help at Discharge: Family, Friend(s), Available 24 hours/day Type of Home: Other(Comment)(boarding house) Home Access: Stairs to enter Home Layout: One level Bathroom Shower/Tub: Chiropodist: Standard Additional Comments: pt states she has a few steps to enter home, but she bumps up steps with w/c and assist of brother/boarding house employees.  Lives With: Family IADL History Homemaking Responsibilities: No Current License: No Occupation: Unemployed Prior Function Level of Independence: Independent with basic ADLs, Independent with transfers, Needs assistance with homemaking(states her brother assists with meals) Comments: Pt reports using w/c independently, performs AP transfers in and out of w/c without assist. Pt states she sponge bathes and her brother provides food for her. ADL ADL Eating: Modified independent Where Assessed-Eating: Bed level Grooming: Modified independent Where Assessed-Grooming: Bed level Upper Body Bathing: Supervision/safety Where Assessed-Upper Body Bathing: Bed level Lower Body Bathing: Minimal assistance Where Assessed-Lower Body Bathing: Bed level Upper Body Dressing: Minimal assistance Where Assessed-Upper Body Dressing: Bed level Lower Body Dressing: Unable to assess Toileting: Unable to assess Vision Baseline Vision/History: No visual deficits Patient Visual Report: No change from baseline Vision Assessment?: No apparent visual deficits Perception  Perception: Within Functional Limits Praxis Praxis: Intact Cognition Overall Cognitive Status: No family/caregiver present to determine baseline cognitive functioning Arousal/Alertness: Awake/alert Orientation Level: Person;Place;Situation Person: Oriented Place: Oriented Situation: Oriented Year: (refused to state) Month: (refused to state) Day of Week: (refused to state) Memory:  (unable to assess-pt irritable and not answering/attempting questions) Immediate Memory Recall: Blue;Sock;Bed Memory Recall Sock: Not able to recall Memory Recall Blue: Not able to  recall Memory Recall Bed: Not able to recall Attention: Sustained Sustained Attention: Appears intact Awareness: Appears intact Safety/Judgment: Appears intact Sensation Sensation Light Touch: Impaired Detail Light Touch Impaired Details: Impaired RLE;Impaired LLE Additional Comments: no light touch in distal LE or near inscision site Coordination Gross Motor Movements are Fluid and Coordinated: No Fine Motor Movements are Fluid and Coordinated: Yes Coordination and Movement Description: limited by pain in BLE Motor  Motor Motor: Within Functional Limits Motor - Skilled Clinical Observations: WFL limited by pain Mobility  Bed Mobility Bed Mobility: Rolling Right;Rolling Left;Supine to Sit Rolling Right: Minimal Assistance - Patient > 75% Rolling Left: Minimal Assistance - Patient > 75% Supine to Sit: (refused 2/2 pain)  Trunk/Postural Assessment  Cervical Assessment Cervical Assessment: Within Functional Limits Thoracic Assessment Thoracic Assessment: Within Functional Limits Lumbar Assessment Lumbar Assessment: Within Functional Limits Postural Control Postural Control: (unable to assess)  Balance Balance Balance Assessed: No Dynamic Sitting Balance Sitting balance - Comments: unable to assess due to reported pain Extremity/Trunk Assessment RUE Assessment RUE Assessment: Within Functional Limits LUE Assessment LUE Assessment: Within Functional Limits     Refer to Care Plan for Long Term Goals  Recommendations for other services: None    Discharge Criteria: Patient will be discharged from OT if patient refuses treatment 3 consecutive times without medical reason, if treatment goals not met, if there is a change in medical status, if patient makes no progress towards goals or if  patient is discharged from hospital.  The above assessment, treatment plan, treatment alternatives and goals were discussed and mutually agreed upon: by patient  [name] Sites 11/11/2019, 12:58 PM

## 2019-11-11 NOTE — Progress Notes (Signed)
Occupational Therapy Session Note  Patient Details  Name: [name] MRN: [id number] Date of Birth: 06/16/1961  Today's Date: 11/11/2019 OT Individual Time: 1530-1600 OT Individual Time Calculation (min): 30 min    Short Term Goals: Week 1:  OT Short Term Goal 1 (Week 1): STG= LTG d/t ELOS  Skilled Therapeutic Interventions/Progress Updates:  Pt received supine with NT attending to incontinent urination. Pt edu on importance of calling for assistance with incontinence to ensure maintenance of skin integrity. Pt rolled R with CGA, reporting she prefers to not roll L 2/2 pain. Brief donned and peri hygiene max A. Pt adamantly refused any EOB sitting or transfers. Pt agreeable to BUE strengthening circuit at bed level. Pt held a 3 lb dowel and followed demonstration. No reports of pain. Pt was left supine with all needs met, bed alarm set.   Therapy Documentation Precautions:  Precautions Precautions: Fall Precaution Comments: B wound vacs Restrictions Weight Bearing Restrictions: Yes RLE Weight Bearing: Non weight bearing LLE Weight Bearing: Non weight bearing Pain: Pain Assessment Pain Scale: Faces Pain Score: 8  Faces Pain Scale: Hurts whole lot Pain Type: Acute pain Pain Location: Incision Pain Orientation: Right;Left Pain Descriptors / Indicators: Aching Pain Frequency: Constant Pain Onset: With Activity Pain Intervention(s): Repositioned;Rest   Therapy/Group: Individual Therapy   H  11/11/2019, 3:46 PM  

## 2019-11-12 NOTE — Progress Notes (Signed)
[name] PHYSICAL MEDICINE & REHABILITATION PROGRESS NOTE   Subjective/Complaints:   Pt reports she got breakfast this AM- hasn't started eating quite yet, but happy with amount of food.  Says has a little pain in stumps- no other complaints.   ROS:  Denies SOB, CP, abd pain; N/V/C/D, vision changes or headaches.   Objective:   No results found. Recent Labs    11/11/19 1156  WBC 12.6*  HGB 10.1*  HCT 32.7*  PLT 263   Recent Labs    11/11/19 1156  NA 135  K 4.3  CL 98  CO2 25  GLUCOSE 101*  BUN 9  CREATININE 0.56  CALCIUM 9.1    Intake/Output Summary (Last 24 hours) at 11/12/2019 1346 Last data filed at 11/12/2019 0736 Gross per 24 hour  Intake 602 ml  Output 0 ml  Net 602 ml     Physical Exam: Vital Signs Blood pressure (!) 143/74, pulse 92, temperature 98 F (36.7 C), resp. rate 16, SpO2 92 %.  Constitutional: sitting up in bed; about to eat double portions breakfast; NAD; She appears cachectic. She has a sickly appearance. No distress.  HENT:  Head: Normocephalic and atraumatic.  Eyes: conjugate gaze Neck: No tracheal deviation present.  CV: borderline tachycardia when listened her Respiratory: CTA B/L GI: Soft. She exhibits no distension; NT, (+) hypoactive BS.  Musculoskeletal:     Comments: B/l AKA with edema and tenderness Left hand digit amputation  Neurological: She is alert and oriented to person, place, and time.  Motor: B/l UE: 4/5 proximal to distal B/l LE: HF 1/5 (pain inhibition)  Skin: She is not diaphoretic.  +Vac b/l stumps  Psychiatric: less irritable      Assessment/Plan: 1. 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  Physiatrist is providing close team supervision and 24 hour management of active medical problems listed below.  Physiatrist and rehab team continue to assess  barriers to discharge/monitor patient progress toward functional and medical goals  Care Tool:  Bathing    Body parts bathed by patient: Right arm, Left arm, Abdomen, Chest, Front perineal area, Face, Right upper leg, Left upper leg   Body parts bathed by helper: Buttocks Body parts n/a: Right lower leg, Left lower leg(B AKA)   Bathing assist Assist Level: Moderate Assistance - Patient 50 - 74%(bed level)     Upper Body Dressing/Undressing Upper body dressing   What is the patient wearing?: Hospital gown only    Upper body assist Assist Level: Moderate Assistance - Patient 50 - 74%    Lower Body Dressing/Undressing Lower body dressing    Lower body dressing activity did not occur: Safety/medical concerns(B wound vac, high AKA) What is the patient wearing?: Incontinence brief     Lower body assist Assist for lower body dressing: Maximal Assistance - Patient 25 - 49%     Toileting Toileting Toileting Activity did not occur (Clothing management and hygiene only): N/A (no void or bm)  Toileting assist Assist for toileting: Total Assistance - Patient < 25%     Transfers Chair/bed transfer  Transfers assist  Chair/bed transfer activity did not occur: Refused        Locomotion Ambulation   Ambulation assist   Ambulation activity did not occur: Safety/medical concerns          Walk 10 feet activity   Assist  Walk  10 feet activity did not occur: N/A        Walk 50 feet activity   Assist Walk 50 feet with 2 turns activity did not occur: N/A         Walk 150 feet activity   Assist Walk 150 feet activity did not occur: N/A         Walk 10 feet on uneven surface  activity   Assist Walk 10 feet on uneven surfaces activity did not occur: N/A         Wheelchair     Assist Will patient use wheelchair at discharge?: Yes Type of Wheelchair: Power Wheelchair activity did not occur: Refused         Wheelchair 50 feet with 2 turns  activity    Assist    Wheelchair 50 feet with 2 turns activity did not occur: Refused       Wheelchair 150 feet activity     Assist  Wheelchair 150 feet activity did not occur: Refused       Blood pressure (!) 143/74, pulse 92, temperature 98 F (36.7 C), resp. rate 16, SpO2 92 %.  Medical Problem List and Plan: 1.  Deficits with mobility, transfers, self-care secondary to bilateral AKA revision.             -patient may shower             -ELOS/Goals: 2-4 days/Mod I at wheelchair level. 2.  Antithrombotics: -DVT/anticoagulation:  Pharmaceutical: Lovenox             -antiplatelet therapy: N/A 3. Chronic pain/Pain Management: Hydrocodone prn.  2/6- increase Norco to q4 hours prn and added Gabapentin 300 mg QHS for nerve pain- can increase over next few days.               Monitor with increased mobility 4. Mood: LCSW to follow for evaluation and support.              -antipsychotic agents: N/A 5. Neuropsych: This patient is capable of making decisions on her own behalf. 6. Skin/Wound Care: Monitor wounds for healing - severe protein and calorie malnutrition---continue Ensure supplements due to malnourished state.  2/6- gave double portions/double protein due to cachetic state  7. Fluids/Electrolytes/Nutrition: Monitor I/O. CMP ordered.  8. HTN: Monitor BP tid--continue Norvasc.              Monitor with increased mobility. 9. Leucocytosis: Continue to monitor for signs of infection.             CBC ordered.  2/6- not done yet- will monitor  2/7- WBC slightly up more 12.6k- up from 11.4- on Doxycycline currently- ABX narrowed down- VAC placed 2/3- so to come off 2/10- will recheck labs in AM- afebrile! 10. Hypokalemia: Resolving with addition of supplement.              CMP ordered  2/6- not done yet- will monitor  2/7- K+ 4.3 11.  Abnormal LFTs: Resolving.              CMP ordered  2/6- not done yet- will monitor  2/7- getting MUCH better- 50% better 12. Tobacco  use/Cocaine abuse: Continue to educate on importance of cessation.      LOS: 2 days A FACE TO FACE EVALUATION WAS PERFORMED  [name] 11/12/2019, 1:46 PM

## 2019-11-13 ENCOUNTER — Inpatient Hospital Stay (HOSPITAL_COMMUNITY): Payer: Medicaid Other

## 2019-11-13 ENCOUNTER — Inpatient Hospital Stay (HOSPITAL_COMMUNITY): Payer: Medicaid Other | Admitting: Occupational Therapy

## 2019-11-13 ENCOUNTER — Telehealth: Payer: Self-pay

## 2019-11-13 DIAGNOSIS — E46 Unspecified protein-calorie malnutrition: Secondary | ICD-10-CM

## 2019-11-13 DIAGNOSIS — Z89612 Acquired absence of left leg above knee: Secondary | ICD-10-CM

## 2019-11-13 DIAGNOSIS — I999 Unspecified disorder of circulatory system: Secondary | ICD-10-CM

## 2019-11-13 DIAGNOSIS — R7401 Elevation of levels of liver transaminase levels: Secondary | ICD-10-CM

## 2019-11-13 DIAGNOSIS — E871 Hypo-osmolality and hyponatremia: Secondary | ICD-10-CM

## 2019-11-13 DIAGNOSIS — Z89611 Acquired absence of right leg above knee: Secondary | ICD-10-CM

## 2019-11-13 DIAGNOSIS — F14988 Cocaine use, unspecified with other cocaine-induced disorder: Secondary | ICD-10-CM

## 2019-11-13 DIAGNOSIS — E8809 Other disorders of plasma-protein metabolism, not elsewhere classified: Secondary | ICD-10-CM

## 2019-11-13 DIAGNOSIS — D638 Anemia in other chronic diseases classified elsewhere: Secondary | ICD-10-CM

## 2019-11-13 DIAGNOSIS — I10 Essential (primary) hypertension: Secondary | ICD-10-CM

## 2019-11-13 LAB — CBC
HCT: 33.3 % — ABNORMAL LOW (ref 36.0–46.0)
Hemoglobin: 10.3 g/dL — ABNORMAL LOW (ref 12.0–15.0)
MCH: 25.4 pg — ABNORMAL LOW (ref 26.0–34.0)
MCHC: 30.9 g/dL (ref 30.0–36.0)
MCV: 82.2 fL (ref 80.0–100.0)
Platelets: 341 10*3/uL (ref 150–400)
RBC: 4.05 MIL/uL (ref 3.87–5.11)
RDW: 18.9 % — ABNORMAL HIGH (ref 11.5–15.5)
WBC: 9.7 10*3/uL (ref 4.0–10.5)
nRBC: 0 % (ref 0.0–0.2)

## 2019-11-13 LAB — BASIC METABOLIC PANEL
Anion gap: 14 (ref 5–15)
BUN: 22 mg/dL — ABNORMAL HIGH (ref 6–20)
CO2: 23 mmol/L (ref 22–32)
Calcium: 9.1 mg/dL (ref 8.9–10.3)
Chloride: 97 mmol/L — ABNORMAL LOW (ref 98–111)
Creatinine, Ser: 0.7 mg/dL (ref 0.44–1.00)
GFR calc Af Amer: >60 mL/min (ref >60)
GFR calc non Af Amer: >60 mL/min (ref >60)
Glucose, Bld: 97 mg/dL (ref 70–99)
Potassium: 4.7 mmol/L (ref 3.5–5.1)
Sodium: 134 mmol/L — ABNORMAL LOW (ref 135–145)

## 2019-11-13 MED ORDER — GABAPENTIN 300 MG PO CAPS
300.0000 mg | ORAL_CAPSULE | Freq: Two times a day (BID) | ORAL | Status: DC
  Administered 2019-11-13 – 2019-11-14 (×3): 300 mg via ORAL
  Filled 2019-11-13 (×3): qty 1

## 2019-11-13 NOTE — Progress Notes (Signed)
Inpatient Rehabilitation  Patient information reviewed and entered into eRehab system by Denny Mccree M. Syrianna Schillaci, M.A., CCC/SLP, PPS Coordinator.  Information including medical coding, functional ability and quality indicators will be reviewed and updated through discharge.    

## 2019-11-13 NOTE — Progress Notes (Signed)
Occupational Therapy Session Note  Patient Details  Name: [name] MRN: [id number] Date of Birth: 10-10-1960  Today's Date: 11/13/2019 OT Individual Time: 5400-8676 OT Individual Time Calculation (min): 18 min  and Today's Date: 11/13/2019 OT Missed Time: 42 Minutes Missed Time Reason: Patient unwilling/refused to participate without medical reason;Pain   Short Term Goals: Week 1:  OT Short Term Goal 1 (Week 1): STG= LTG d/t ELOS  Skilled Therapeutic Interventions/Progress Updates:    Treatment session limited by pain and refusal to participate in any tasks at EOB or even bed level.  Pt in bed upon arrival reporting hunger and pain.  Discussed therapy and meal schedules with lunch to arrive at end of session.  Encouraged pt to engage in any self-care tasks at bed level or EOB with pt refusing.  Encouraged pt to engage in exercises for BUE strengthening and trunk control with pt stating "I already did exercises this morning".  Reminded pt of rehab expectations with 3 hrs therapy/day with pt stating "that's too much" and "I already exercised this morning".  Pt requesting snack to tide herself over until lunch arrived  - RN approved cereal for snack.  Pt positioned upright in chair position in bed with pt able to utilize bed rails to reposition upper body without additional assist from therapist.  Will attempt to return as pt/therapist availability allows.  Therapy Documentation Precautions:  Precautions Precautions: Fall Precaution Comments: B wound vacs Restrictions Weight Bearing Restrictions: Yes RLE Weight Bearing: Non weight bearing LLE Weight Bearing: Non weight bearing General: General OT Amount of Missed Time: 42 Minutes Pain:  Pt reports "extreme" pain in RLE.  Repositioned.   Therapy/Group: Individual Therapy  Simonne Come 11/13/2019, 12:27 PM

## 2019-11-13 NOTE — Telephone Encounter (Signed)
[name] (call back ph# [phone]) with [facility] wanted to know if [name] has every seen a Dermatologist?  She is currently an in patient in the hospital.

## 2019-11-13 NOTE — Progress Notes (Signed)
   Right biceps.   Right wrist/palm.   Left forearm,  Late Note:  Attempted to reach out to Dr. Trudie Reed and Dr. Denna Haggard (who has assisted [place] with imput) regarding skin lesions. Reached out to PCP--they report that they have not referred patient to derm/rheum in the past. On reviewing chart/care everywhere note that patient seen by rheum at [facility] about 10 years ago--notes unavailable.  She has medicaid--not taken by any dermatologists in [place] but [facility] accepts her insurance and can follow up with her at their [place] office. Will set appointment for evaluation after discharge.

## 2019-11-13 NOTE — Progress Notes (Signed)
Physical Therapy Session Note  Patient Details  Name: [name] MRN: [id number] Date of Birth: 1961/07/04  Today's Date: 11/13/2019 PT Individual Time: 1224-8250 PT Individual Time Calculation (min): 72 min   Short Term Goals: Week 1:  PT Short Term Goal 1 (Week 1): STG=LTG due to ELOS  Skilled Therapeutic Interventions/Progress Updates:   Received pt supine in bed, pt agreeable to therapy, and denied pain at beginning of session. Session focused on functional mobility/transfers, toileting, dynamic sitting balance, UE strength, and improved tolerance to activity. Therapist cut scrub pants to make shorts for pt. Pt rolled to L and R with CGA and use of bedrails to don pants with mod A due to wound vacs and LLE pain. Pt performed supine<>long sit with HOB elevated with CGA. Pt performed PA transfer from bed<>WC with CGA. Pt performed WC mobility 127f with bilateral UE and supervision to gym. Pt refused to transfer to mat for exercises, but was agreeable to exercises sitting in WC. Pt reported urge to have BM and [facility]. Pt performed AP transfer from WC<>toilet CGA and required mod A to doff pants and incontinence brief. Pt continent of bowel. Pt performed hygiene management with supervision and therapist placed clean in[place]incene brief in [place] transferring over for increased ease donning clean brief. Pt attempted AP transfer from toilet<>WC however pt unsuccessful and with increased pain. Therapist provided verbal cues for technique and to turn around and perform PA transfer. Pt required mod A due to height difference between toilet and WC. Pt required rest breaks during transfer due to increased L LE pain. Completed donning clean incontinence brief with mod A. Pt requested to return to therapy gym and was transported total assist for time management purposes. Pt performed seated bicep curls 2x12 and seated overhead shoulder press x12 with 3lb  dumbbells. Pt required verbal cues for technique. Pt performed seated trunk rotation with 3lb medicine ball 3x5 with verbal cues for technique. Pt performed PA transfer from WC<>bed CGA and sit<>supine CGA. Concluded session with pt supine in bed, needs within reach, and bed alarm on. Therapist provided pt with ice cream.    Therapy Documentation Precautions:  Precautions Precautions: Fall Precaution Comments: B wound vacs Restrictions Weight Bearing Restrictions: Yes RLE Weight Bearing: Non weight bearing LLE Weight Bearing: Non weight bearing   Therapy/Group: Individual Therapy AAlfonse AlpersPT, DPT   11/13/2019, 8:38 AM

## 2019-11-13 NOTE — Progress Notes (Signed)
Physical Therapy Session Note  Patient Details  Name: [name] MRN: [id number] Date of Birth: [birth year]  Today's Date: 11/13/2019 PT Individual Time: 0804-0900 PT Individual Time Calculation (min): 56 min   Short Term Goals: Week 1:  PT Short Term Goal 1 (Week 1): STG=LTG due to ELOS  Skilled Therapeutic Interventions/Progress Updates:    Pt supine in bed upon PT arrival, agreeable to therapy tx however limited activities secondary to patients pain tolerance. Pt reports pain 8/10 in B LE residual limbs. Pt performed rolling this session with min assist towards the R, while in sidelying position therapist performed gentle L limb stretching to hip flexors, pt performed active assisted L hip flexion/extension x 10 and active assisted L hip abduction x 5 for strength/ROM. In supine pt performed oblique mini crunches for core strengthening x 10 per side reaching to target with each UE. Pt became tearful this session, therapist providing emotional support throughout, pt discussing her situation and her day to day prior to this recent further amputation. Pt requesting ice cream this session. Therapist +2 boosted pt up in the bed and then raised HOB to promote pt sitting all the way upright, pillow under R lower limb for comfort and to limit sliding back down in bed. Pt sitting upright in the bed performed the following exercises for UE strength, core strength, endurance and activity tolerance- cross body punches with theraband for resistance x 15 per side, high punches 2 x 10 per side with theraband for resistance, 3 x 30 sec bouts of fast alternating punches with orange theraband, forward reaches to target with min assist to bring upper body off back support x 10, cross body reaching with B UE s with emphasis on bringing scapula/back off back support x 10 per side. Pt performed supine exercises for strengthening with 3# dowel including x20 of the following with cues for techniques: bicep curls, shoulder  flexion, modified trunk rotation to each side (less rotation L due to pain). Pt left supine with needs in reach and chair alarm set.   Therapy Documentation Precautions:  Precautions Precautions: Fall Precaution Comments: B wound vacs Restrictions Weight Bearing Restrictions: Yes RLE Weight Bearing: Non weight bearing LLE Weight Bearing: Non weight bearing    Therapy/Group: Individual Therapy  Netta Corrigan, PT, DPT, CSRS 11/13/2019, 8:13 AM

## 2019-11-13 NOTE — Plan of Care (Signed)

## 2019-11-13 NOTE — Care Management (Signed)
Patient Details  Name: [name] MRN: [id number] Date of Birth: December 27, 1960  Today's Date: 11/13/2019  Problem List:  Patient Active Problem List   Diagnosis Date Noted  . Anemia of chronic disease   . Hyponatremia   . Hypoalbuminemia due to protein-calorie malnutrition ([place])   . Debility 11/10/2019  . S/P AKA (above knee amputation) bilateral ([place]) 11/10/2019  . Wound infection   . Transaminitis   . Hypokalemia   . Leukocytosis   . Benign essential HTN   . Chronic pain syndrome   . Stump pain ([place]) 09/27/2019  . Anemia   . Cellulitis 09/26/2019  . Hyperglycemia 08/27/2019  . Disability of walking 05/28/2019  . At risk for adverse drug event 08/30/2018  . Microcytic hypochromic anemia 08/30/2018  . Thrombocytopenia ([place]) 08/20/2018  . Toxic encephalopathy 08/20/2018  . Goals of care, counseling/discussion   . Palliative care by specialist   . DNR (do not resuscitate) discussion   . Infiltrate noted on imaging study   . Ulcer of right thigh, limited to breakdown of skin ([place])   . S/P bilateral above knee amputation ([place])   . Homelessness 07/30/2018  . Elevated LFTs 07/30/2018  . Osteomyelitis ([place]) 07/30/2018  . Acute encephalopathy 07/30/2018  . Eye swelling, bilateral 07/30/2018  . Skin rash 07/11/2018  . Acute on chronic diastolic CHF (congestive heart failure) ([place]) 06/21/2018  . Malnutrition of moderate degree 05/20/2018  . Acute congestive heart failure ([place])   . Suicidal ideation 02/02/2018  . HCAP (healthcare-associated pneumonia) 02/02/2018  . Acute on chronic respiratory failure with hypoxia ([place]) 02/02/2018  . Sepsis ([place]) 02/02/2018  . Dehiscence of amputation stump ([place]) 10/23/2017  . Cocaine abuse with cocaine-induced mood disorder ([place]) 08/10/2017  . Hypertensive crisis   . Phantom limb pain ([place])   . Tobacco abuse   . Amputation stump pain ([place])   . Acute blood loss anemia   . Atherosclerosis of native arteries of extremities with gangrene,  bilateral legs ([place])   . AKI (acute kidney injury) ([place]) 02/07/2017  . Acute kidney injury ([place]) 02/06/2017  . Wound healing, delayed   . Chest pain 04/07/2015  . Protein-calorie malnutrition, severe ([place]) 01/15/2015  . Cocaine abuse ([place])   . MDD (major depressive disorder), recurrent episode, severe ([place]) 10/16/2014  . Cocaine use disorder, severe, dependence ([place]) 10/10/2014  . 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 Essential hypertension 02/26/2010   Past Medical History:  Past Medical History:  Diagnosis Date  . CAP (community acquired pneumonia) 03/2014 X 2  . Cocaine abuse ([place])    ongoing with resultant vaculitis.  . Depression   . Headache    "weekly" (07/29/2016)  . Hypertension   . Inflammatory arthritis   . Migraines    "probably 5-6/yr" (07/29/2016)  . Normocytic anemia    BL Hgb 9.8-12. Last anemia panel 04/2010 - showing Fe 19, ferritin 101.  Pt on monthly B12 injections  . Oth psychoactive substance abuse w mood disorder ([place]) 2014  . Rheumatoid arthritis(714.0)    patient reported  . S/P bilateral below knee amputation ([place]) 04/13/2017  . 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 Followed by Dr. Louanne Skye   Past Surgical History:  Past Surgical History:  Procedure Laterality Date  . AMPUTATION Left 05/22/2016   Procedure: AMPUTATION LEFT LONG FINGER;  Surgeon: Marybelle Killings, MD;  Location: [facility];  Service: Orthopedics;  Laterality: Left;  . AMPUTATION Bilateral  04/10/2017   Procedure: AMPUTATION BELOW KNEE;  Surgeon: Newt Minion, MD;  Location: [facility];  Service: Orthopedics;  Laterality: Bilateral;  . AMPUTATION Bilateral 02/06/2018   Procedure: AMPUTATION ABOVE KNEE;  Surgeon: Newt Minion, MD;  Location: [facility];  Service: Orthopedics;  Laterality: Bilateral;  . HERNIA REPAIR     "stomach"  . I & D EXTREMITY Right 09/26/2015   Procedure: IRRIGATION AND DEBRIDEMENT LEG WOUND  VAC PLACEMENT.;   Surgeon: Loel Lofty Dillingham, DO;  Location: [facility];  Service: Plastics;  Laterality: Right;  . INCISION AND DRAINAGE OF WOUND Bilateral 10/20/2016   Procedure: IRRIGATION AND DEBRIDEMENT WOUND BILATERAL;  Surgeon: Edrick Kins, DPM;  Location: [facility];  Service: Podiatry;  Laterality: Bilateral;  . IRRIGATION AND DEBRIDEMENT ABSCESS Bilateral 09/26/2013   Procedure: DEBRIDEMENT ULCERS BILATERAL THIGHS;  Surgeon: Gwenyth Ober, MD;  Location: [facility];  Service: General;  Laterality: Bilateral;  . SKIN BIOPSY Bilateral    shin nodules  . STUMP REVISION Bilateral 11/08/2019   Procedure: REVISION BILATERAL ABOVE KNEE AMPUTATION;  Surgeon: Newt Minion, MD;  Location: [facility];  Service: Orthopedics;  Laterality: Bilateral;   Social History:  reports that she has been smoking cigarettes. She has a 4.56 pack-year smoking history. She has never used smokeless tobacco. She reports current drug use. Drugs: "Crack" cocaine and Cocaine. She reports that she does not drink alcohol.  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)   Abuse/Neglect Abuse/Neglect Assessment Can Be Completed: Yes Physical Abuse: Denies Verbal Abuse: Denies Sexual Abuse: Denies Exploitation of patient/patient's resources: Denies Self-Neglect: Denies  Emotional Status Pt's affect, behavior and adjustment status: Normal mood/affect/behavior Psychiatric History: Depression Substance Abuse History: Tobacco/Cocaine-crack abuse  Patient / Family Perceptions, Expectations & Goals Pt/Family understanding of illness & functional limitations: Patient appears to have a good  understanding of her current illness and functional limitations Pt/family expectations/goals: Patient would like to get back to her pre-admission level so she can go to [place] by [employer] to see her sister and niece/nephew  Occupational psychologist available at discharge: Brother will provide transportation at discharge  Discharge Planning Living Arrangements: Other (Comment)(Boarding house) Support Systems: Other relatives, Friends/neighbors Type of Residence: Other (Comment)(Boarding house) Does the patient have any problems obtaining your medications?: No Home Management: Brother manages food and the Scientist, research (physical sciences) and house keeping Patient/Family Preliminary Plans: Home to boarding home with brother [place] Barriers to Discharge: Inaccessible home environment, Decreased caregiver support [place] Barriers to Discharge Comments: 3 step entry to home; using a door to roll wheelchair over steps to entry Social Work Anticipated Follow Up Needs: [place] Additional Notes/Comments: Had wheelchair, amputee support pads, slideboard and tub transfer bench from previous surgery in 04/17/2017 and BSC in 06/05/2016 Expected length of stay: 5-7 days  Clinical Impression Patient alert, emotional/tearful when speaking about her current situation. She is having quite a bit of pain in the leg with prn medication administered. The patient wants to go to [place] to see her sister and niece/nephew in the near future. Reports she has been thinking a lot about family. Anxious to go home to her usual routine. Brother is her support person and will offer food and transportation assistance.  Checking on DME status.  [name] 11/13/2019, 10:30 AM

## 2019-11-13 NOTE — Care Management (Signed)
[facility] Individual Statement of Services  Patient Name:  [name]  Date:  11/13/2019  Welcome to the [facility].  Our goal is to provide you with an individualized program based on your diagnosis and situation, designed to meet your specific needs.  With this comprehensive rehabilitation program, you will be expected to participate in at least 3 hours of rehabilitation therapies Monday-Friday, with modified therapy programming on the weekends.  Your rehabilitation program will include the following services:  Physical Therapy (PT), Occupational Therapy (OT), 24 hour per day rehabilitation nursing, Neuropsychology, Case Management (Social Worker), Rehabilitation Medicine, Nutrition Services and Pharmacy Services  Weekly team conferences will be held on Wednesdays to discuss your progress.  Your Social Worker will talk with you frequently to get your input and to update you on team discussions.  Team conferences with you and your family in attendance may also be held.  Expected length of stay: 7 days  Overall anticipated outcome: Mod I w/c level overall  Depending on your progress and recovery, your program may change. Your Social Worker will coordinate services and will keep you informed of any changes. Your Social Worker's name and contact numbers are listed  below.  The following services may also be recommended but are not provided by the [facility]:    [place] will be made to provide these services after discharge if needed.  Arrangements include referral to agencies that provide these services.  Your insurance has been verified to be:  Medicaid of [place] Your primary doctor is:  Kathe Becton, FNP  Pertinent information will be shared with your doctor and your insurance company.  Social Worker:  [place], [place] or  ([place] [phone]  Care Manager: Dorien Chihuahua, RN (O) [phone] ([phone]  Information discussed with and copy given to patient by: Margarito Liner, 11/13/2019, 9:20 AM

## 2019-11-13 NOTE — Progress Notes (Signed)
Patient's left stump wound vac was leak alarm was going off. Vac dressing was reinforced and it passed the leak test. Vac functioning properly.

## 2019-11-13 NOTE — Progress Notes (Signed)
[facility] PHYSICAL MEDICINE & REHABILITATION PROGRESS NOTE   Subjective/Complaints: Patient seen laying in bed this morning.  She states she slept fairly overnight.  She notes she had some shortness of breath earlier this morning but that has resolved.  Nursing noted lesions on her right upper extremity.  ROS: Denies SOB, CP, abd pain; N/V/D.  We will  Objective:   No results found. Recent Labs    11/11/19 1156 11/13/19 0535  WBC 12.6* 9.7  HGB 10.1* 10.3*  HCT 32.7* 33.3*  PLT 263 341   Recent Labs    11/11/19 1156 11/13/19 0535  NA 135 134*  K 4.3 4.7  CL 98 97*  CO2 25 23  GLUCOSE 101* 97  BUN 9 22*  CREATININE 0.56 0.70  CALCIUM 9.1 9.1    Intake/Output Summary (Last 24 hours) at 11/13/2019 0824 Last data filed at 11/13/2019 0600 Gross per 24 hour  Intake 480 ml  Output 601 ml  Net -121 ml     Physical Exam: Vital Signs Blood pressure 133/66, pulse 89, temperature 98.7 F (37.1 C), temperature source Oral, resp. rate 20, SpO2 99 %. Constitutional: No distress . Vital signs reviewed.  Cachectic. HENT: Normocephalic.  Atraumatic. Eyes: EOMI. No discharge. Cardiovascular: No JVD. Respiratory: Normal effort.  No stridor. GI: Non-distended. Skin: B/l VACs to stumps Right upper extremity with 3 circular red lesions, tender to touch Psych: Normal mood.  Normal behavior. Musc: Bilateral AKA with edema and tenderness. Left hand third digit amputation  Bilateral hand deformities Neurological: Alert Motor: B/l UE: 4/5 proximal to distal B/l LE: HF 1/5 (pain inhibition), some improvement  Assessment/Plan: 1. 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  Physiatrist is providing close team supervision and 24 hour management of active medical problems listed below.  Physiatrist and rehab team continue to assess  barriers to discharge/monitor patient progress toward functional and medical goals  Care Tool:  Bathing    Body parts bathed by patient: Right arm, Left arm, Abdomen, Chest, Front perineal area, Face, Right upper leg, Left upper leg   Body parts bathed by helper: Buttocks Body parts n/a: Right lower leg, Left lower leg(B AKA)   Bathing assist Assist Level: Moderate Assistance - Patient 50 - 74%(bed level)     Upper Body Dressing/Undressing Upper body dressing   What is the patient wearing?: Hospital gown only    Upper body assist Assist Level: Moderate Assistance - Patient 50 - 74%    Lower Body Dressing/Undressing Lower body dressing    Lower body dressing activity did not occur: Safety/medical concerns(B wound vac, high AKA) What is the patient wearing?: Incontinence brief     Lower body assist Assist for lower body dressing: Maximal Assistance - Patient 25 - 49%     Toileting Toileting Toileting Activity did not occur (Clothing management and hygiene only): N/A (no void or bm)  Toileting assist Assist for toileting: Total Assistance - Patient < 25%     Transfers Chair/bed transfer  Transfers assist  Chair/bed transfer activity did not occur: Refused        Locomotion Ambulation   Ambulation assist   Ambulation activity did not occur: Safety/medical concerns          Walk 10 feet activity   Assist  Walk 10 feet activity did not occur: N/A        Walk  50 feet activity   Assist Walk 50 feet with 2 turns activity did not occur: N/A         Walk 150 feet activity   Assist Walk 150 feet activity did not occur: N/A         Walk 10 feet on uneven surface  activity   Assist Walk 10 feet on uneven surfaces activity did not occur: N/A         Wheelchair     Assist Will patient use wheelchair at discharge?: Yes Type of Wheelchair: Power Wheelchair activity did not occur: Refused         Wheelchair 50 feet with 2 turns  activity    Assist    Wheelchair 50 feet with 2 turns activity did not occur: Refused       Wheelchair 150 feet activity     Assist  Wheelchair 150 feet activity did not occur: Refused       Blood pressure 133/66, pulse 89, temperature 98.7 F (37.1 C), temperature source Oral, resp. rate 20, SpO2 99 %.  Medical Problem List and Plan: 1.  Deficits with mobility, transfers, self-care secondary to bilateral AKA revision.            Continue CIR  Doxycycline through 2/11 2.  Antithrombotics: -DVT/anticoagulation:  Pharmaceutical: Lovenox             -antiplatelet therapy: N/A 3. Chronic pain/Pain Management: Hydrocodone prn.  Increased Norco to q4 hours prn   Gabapentin 300 mg QHS for nerve pain             Monitor with increased mobility 4. Mood: LCSW to follow for evaluation and support.              -antipsychotic agents: N/A 5. Neuropsych: This patient is capable of making decisions on her own behalf. 6. Skin/Wound Care: Monitor wounds for healing   Will attempt to consult Rheum vs ID regarding right upper extremity lesions  Plan to DC VAC on 2/10 7. Fluids/Electrolytes/Nutrition: Monitor I/O. CMP ordered.  8. HTN: Monitor BP   Continue Norvasc.   Controlled on 2/8             Monitor with increased mobility. 9. Leucocytosis: Resolved  Continue to monitor for signs of infection.  10. Hypokalemia:   Potassium 4.7 on 2/8  Supplement DC'd 11.  Transaminitis  LFTs elevated, but improving on 2/6 12. Tobacco use/Cocaine abuse: Continue to educate on importance of cessation.  13.  Hypoalbuminemia  Supplement initiated 14.  Hyponatremia  Sodium 134 on 2/8  Continue to monitor 15.  Anemia of chronic disease  Hemoglobin 10.3 on 2/8  Continue to monitor  LOS: 3 days A FACE TO FACE EVALUATION WAS PERFORMED  Ankit Lorie Phenix 11/13/2019, 8:24 AM

## 2019-11-14 ENCOUNTER — Encounter (HOSPITAL_COMMUNITY): Payer: Medicaid Other | Admitting: Psychology

## 2019-11-14 ENCOUNTER — Inpatient Hospital Stay (HOSPITAL_COMMUNITY): Payer: Medicaid Other

## 2019-11-14 ENCOUNTER — Inpatient Hospital Stay (HOSPITAL_COMMUNITY): Payer: Medicaid Other | Admitting: Occupational Therapy

## 2019-11-14 DIAGNOSIS — G546 Phantom limb syndrome with pain: Secondary | ICD-10-CM

## 2019-11-14 DIAGNOSIS — F1420 Cocaine dependence, uncomplicated: Secondary | ICD-10-CM

## 2019-11-14 MED ORDER — OXYCODONE HCL 5 MG PO TABS
5.0000 mg | ORAL_TABLET | Freq: Once | ORAL | Status: AC
  Administered 2019-11-14: 03:00:00 5 mg via ORAL
  Filled 2019-11-14: qty 1

## 2019-11-14 MED ORDER — GABAPENTIN 300 MG PO CAPS
300.0000 mg | ORAL_CAPSULE | Freq: Three times a day (TID) | ORAL | Status: DC
  Administered 2019-11-14 – 2019-11-21 (×21): 300 mg via ORAL
  Filled 2019-11-14 (×21): qty 1

## 2019-11-14 MED ORDER — OXYCODONE HCL 5 MG PO TABS
5.0000 mg | ORAL_TABLET | Freq: Four times a day (QID) | ORAL | Status: DC | PRN
  Administered 2019-11-14 – 2019-11-15 (×5): 5 mg via ORAL
  Filled 2019-11-14 (×5): qty 1

## 2019-11-14 NOTE — Progress Notes (Signed)
Physical Therapy Session Note  Patient Details  Name: [name] MRN: [id number] Date of Birth: July 29, 1961  Today's Date: 11/14/2019 PT Individual Time: 4098-1191 and 1400-1425 PT Individual Time Calculation (min): 9 min 25 min and Today's Date: 11/14/2019 PT Missed Time: 66 Minutes and 35 minutes  Missed Time Reason: Pain;Patient unwilling to participate  Short Term Goals: Week 1:  PT Short Term Goal 1 (Week 1): STG=LTG due to ELOS  Skilled Therapeutic Interventions/Progress Updates:   Treatment Session 1: 1045-1554 9 min Received pt supine in bed asleep, pt easily woken, and reporting 3/10 pain in bilateral LEs but stating it hurt too bad to participate in therapy. Pt reported not being able to get pain medication again until 3pm. Therapist educated pt on importance of participation in therapy, mobility, and LE strengthening, however pt continued to refuse. Pt requesting to rest and try therapy again later. Therapist attempted to see pt again 45 minutes later. Pt refused therapy for a second time, stating "I'll get up with you this afternoon". Therapist assisted with helping pt place phone call to order lunch and with repositioning in bed for comfort. 66 minutes missed of skilled physical therapy due to pain/unwillingness to participate.   Treatment Session 2: 4782-9562 25 min Received pt supine in bed, pt reported she is still in pain and isn't feeling better this afternoon. RN made aware of pt's status and reported pt not able to receive pain medication until 3:15pm. Therapist educated pt on importance of participation in therapy and OOB mobility for improved respiratory, cardiovascular, and musculoskeletal benefits however, pt continued to refuse. Discussed home setup and plan for discharge with patient. Pt stated she plans to discharge home to brother's house in ambulance. Pt's brother has 3 STE with 2 rails and plans on having brother and friend pick her up in the wheelchair and carry her up  the steps (as that is what she did previously). Therapist educated pt on benefits of ramp and pt stated her brother plans on getting a ramp at some point but "he is busy right now". Discussed equipment and home set up, pt stated she has a manual WC, power WC, and a RW but she has not walked in years. Therapist educated pt on home health therapy options, pt stated "I'll need to check with by brother because it's his house". Therapist provided amputee education regarding phantom limb pain and mental imagery strategies to reduce pain, desensitization techniques and techniques for handling her residual limbs, pressure relief techniques and turning schedule, importance of spending time in prone position to reduce risk of hip flexor contractions, and expectations for limb care once wound vacs are removed. Concluded session with pt supine in bed, needs within reach, and bed alarm on. Therapist provided pt with snacks. 35 minutes missed of skilled physical therapy due to pain/unwillingness to participate.   Therapy Documentation Precautions:  Precautions Precautions: Fall Precaution Comments: B wound vacs Restrictions Weight Bearing Restrictions: Yes RLE Weight Bearing: Non weight bearing LLE Weight Bearing: Non weight bearing  Therapy/Group: Individual Therapy Alfonse Alpers PT, DPT   11/14/2019, 8:39 AM

## 2019-11-14 NOTE — Progress Notes (Addendum)
Patient has been crying on and off during beginning of shift stating her pain is 9 out of 10. After patient received 2nd PRN Norco, she states Norco doesn't work for her because of the tylenol causing her to break out in rash. Brown circular spots noted to RUE. Patient upset she "continues to receive tylenol" in the [place]. Patient could not remember what medication worked for her in the past at first saying morphine then when RN informed her that one time ordered dose oxycodone can be given after 2 hours, patient stated that oxycodone was the medication that decreases her pain. Informed patient that norco will be discontinued and she can have one time dose of oxy. Patient stated that she will try to sleep til then. Pt is calm at this time, call light in reach.   Update: Oxy given at 0230, Emphasized to patient that it was a one time dose and no other pain medication would be available until the morning with attending MD since Norco was to be discontinued. Pt verbalized understanding. Pt called to nurse station crying stating she needed something for pain. Informed pt of previous info, pt stated "okayy" and went to sleep. No signs of distress at this time, Call light in reach.

## 2019-11-14 NOTE — Progress Notes (Signed)
Occupational Therapy Session Note  Patient Details  Name: [name] MRN: [id number] Date of Birth: 01/09/1961  Today's Date: 11/14/2019 OT Individual Time: 6160-7371 OT Individual Time Calculation (min): 30 min  and Today's Date: 11/14/2019 OT Missed Time: 30 Minutes Missed Time Reason: Patient unwilling/refused to participate without medical reason;Pain   Short Term Goals: Week 1:  OT Short Term Goal 1 (Week 1): STG= LTG d/t ELOS  Skilled Therapeutic Interventions/Progress Updates:    Treatment session limited secondary to pain and fatigue.  Pt received supine in bed, pt tearful during session reporting excruciating pain in BLE and declining any therapy even bed level suggestions.  Engaged in discussion regarding pt typical routines for self-care tasks.  Pt reports having BSC and completes A/P transfers bed <> BSC.  Pt reports bathroom is not w/c accessible.  She reports she has bathed at bath level but requires transfers to floor to access bathroom.  Discussed concerns with transfer technique and encouraged bathing at bed level or sink from w/c.  Pt refusing therapy at this time due to pain, but appreciative of therapist's time.  Will attempt to return as pt/therapist availability allows.  Therapy Documentation Precautions:  Precautions Precautions: Fall Precaution Comments: B wound vacs Restrictions Weight Bearing Restrictions: Yes RLE Weight Bearing: Non weight bearing LLE Weight Bearing: Non weight bearing General: General OT Amount of Missed Time: 30 Minutes  Pain:  Pt with reports of excruciating pain.  Repositioned.   Therapy/Group: Individual Therapy  Simonne Come 11/14/2019, 12:27 PM

## 2019-11-14 NOTE — Consult Note (Signed)
Neuropsychological Consultation   Patient:   [name]   DOB:   December 10, 1960  MR Number:  [id number]  Location:  [facility] [address] [facility] [address] [place] [id number] [place] [address] Dept: [place]: [phone]           Date of Service:   11/14/19  Start Time:   3 PM End Time:   4 PM  Provider/Observer:  Ilean Skill, Psy.D.       Clinical Neuropsychologist       Billing Code/Service: [id number]  Chief Complaint:    [name] is a 59 year old female with history of HTN, cocaine induced vasculopathy, medical non-compliance, vasculitis, B-AKA and depression.  Admitted on 11/04/19 with pain in bilateral stumps, multiple skin lesions, purulent drainage from R-AKA and painful ulcer L-AKA.  UDS positive of opiates and cocaine but opiates may have come from prescription.  Started on broad spectrum antibiotics for sepsis due to cellulitis.  OR for I&D.  Patient with admitted long history of cocaine abuse.  Denies opiate abuse but freely admits to smoking crack for most of adult life.    Reason for Service:  Patient referred for neuropsychological consolation due to coping/adjusmtent and history of cocaine abuse.  Below is the HPI for the current admission.  HPI: [name]. [name] is a 59 year old female with history of HTN, cocaine induced vasculopathy, medical non-compliance,  levamisole induced vasculitis, s/p B-AKA who was admitted on 11/04/2019 with pain in b/l stumps, multiple skin lesions, purulent drainage from R-AKA and painful ulcer L-AKA. History taken from chart review and patient. UDS positive for opiates and cocaine.  She was started on broad spectrum antibiotics for sepsis due to cellulitis and MRI right thigh showed marked soft tissue edema with ascites within visualized portion of pelvis--no evidence of osteomyelitis.   She was taken to OR on 11/08/2019 for I&D of necrotic tissue with placement of Prevana VAC by  Dr. Sharol Given. Antibiotics narrowed to doxycycline post procedure with recommendations to continue for 7 total day treatment. Post op therapy evaluations done revealing limitation in mobility and ADLs due to pain and weakness. CIR recommended due to functional decline. Please see preadmission assessment from earlier today as well.   Current Status:  Patient was alert and active and showed no inhibition to admit to history of substance abuse.  Memory and cognition was generally intact especially given her long history of cocaine abuse.    Behavioral Observation: [name]  presents as a 59 y.o.-year-old Right African American Female who appeared her stated age. her dress was Appropriate and she was Well Groomed and her manners were Appropriate to the situation.  her participation was indicative of Appropriate and Attentive behaviors.  There were any physical disabilities noted.  she displayed an appropriate level of cooperation and motivation.     Interactions:    Active Appropriate and Attentive  Attention:   abnormal and attention span appeared shorter than expected for age  Memory:   within normal limits; recent and remote memory intact  Visuo-spatial:  not examined  Speech (Volume):  normal  Speech:   normal; normal  Thought Process:  Coherent and Tangential  Though Content:  WNL; not suicidal and not homicidal  Orientation:   person, place, time/date and situation  Judgment:   Fair  Planning:   Fair  Affect:    Excited  Mood:    Euthymic  Insight:  Shallow  Intelligence:   low  Substance Use:  There is a documented history of cocaine abuse confirmed by the patient.    Medical History:   Past Medical History:  Diagnosis Date  . CAP (community acquired pneumonia) 03/2014 X 2  . Cocaine abuse ([place])    ongoing with resultant vaculitis.  . Depression   . Headache    "weekly" (07/29/2016)  . Hypertension   . Inflammatory arthritis   . Migraines    "probably 5-6/yr"  (07/29/2016)  . Normocytic anemia    BL Hgb 9.8-12. Last anemia panel 04/2010 - showing Fe 19, ferritin 101.  Pt on monthly B12 injections  . Oth psychoactive substance abuse w mood disorder ([place]) 2014  . Rheumatoid arthritis(714.0)    patient reported  . S/P bilateral below knee amputation ([place]) 04/13/2017  . 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 Followed by Dr. Louanne Skye     Psychiatric History:  History of substance abuse and depression.  Family Med/Psych History:  Family History  Problem Relation Age of Onset  . Breast cancer Mother        Breast cancer  . Alcohol abuse Mother   . Colon cancer Maternal Aunt 47  . Alcohol abuse Father     Impression/DX:  [name] is a 59 year old female with history of HTN, cocaine induced vasculopathy, medical non-compliance, vasculitis, B-AKA and depression.  Admitted on 11/04/19 with pain in bilateral stumps, multiple skin lesions, purulent drainage from R-AKA and painful ulcer L-AKA.  UDS positive of opiates and cocaine but opiates may have come from prescription.  Started on broad spectrum antibiotics for sepsis due to cellulitis.  OR for I&D.  Patient with admitted long history of cocaine abuse.  Denies opiate abuse but freely admits to smoking crack for most of adult life.  Patient was alert and active and showed no inhibition to admit to history of substance abuse.  Memory and cognition was generally intact especially given her long history of cocaine abuse.         Electronically Signed   _______________________ Ilean Skill, Psy.D.

## 2019-11-14 NOTE — Progress Notes (Signed)
[facility] PHYSICAL MEDICINE & REHABILITATION PROGRESS NOTE   Subjective/Complaints: Patient seen laying in bed this morning, resting comfortably.  After waking patient up, she states she did not sleep well overnight due to pain.  She states the lesions on her arm are secondary to Tylenol allergy, although she states she has never had this before.  ROS: Denies SOB, CP, abd pain; N/V/D.  We will  Objective:   No results found. Recent Labs    11/11/19 1156 11/13/19 0535  WBC 12.6* 9.7  HGB 10.1* 10.3*  HCT 32.7* 33.3*  PLT 263 341   Recent Labs    11/11/19 1156 11/13/19 0535  NA 135 134*  K 4.3 4.7  CL 98 97*  CO2 25 23  GLUCOSE 101* 97  BUN 9 22*  CREATININE 0.56 0.70  CALCIUM 9.1 9.1    Intake/Output Summary (Last 24 hours) at 11/14/2019 0823 Last data filed at 11/14/2019 0815 Gross per 24 hour  Intake 700 ml  Output 1550 ml  Net -850 ml     Physical Exam: Vital Signs Blood pressure 121/86, pulse 87, temperature 98.8 F (37.1 C), temperature source Oral, resp. rate 18, SpO2 100 %. Constitutional: No distress . Vital signs reviewed.  Cachectic. HENT: Normocephalic.  Atraumatic. Eyes: EOMI. No discharge. Cardiovascular: No JVD. Respiratory: Normal effort.  No stridor. GI: Non-distended. Skin: Bilateral vacs to stumps Right upper extremity with 3 circular red lesions, nontender today, no change in size Scattered lesions Psych: Normal mood.  Normal behavior. Musc: Bilateral AKA with edema and tenderness Left upper extremity third digit amputation Bilateral hand deformities Neurological: Alert  Motor: B/l UE: 4/5 proximal to distal B/l LE: HF 1/5 (pain inhibition), improving  Assessment/Plan: 1. 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  Physiatrist is providing close team supervision and 24 hour management of  active medical problems listed below.  Physiatrist and rehab team continue to assess barriers to discharge/monitor patient progress toward functional and medical goals  Care Tool:  Bathing    Body parts bathed by patient: Right arm, Left arm, Abdomen, Chest, Front perineal area, Face, Right upper leg, Left upper leg   Body parts bathed by helper: Buttocks Body parts n/a: Right lower leg, Left lower leg(B AKA)   Bathing assist Assist Level: Moderate Assistance - Patient 50 - 74%(bed level)     Upper Body Dressing/Undressing Upper body dressing   What is the patient wearing?: Hospital gown only    Upper body assist Assist Level: Moderate Assistance - Patient 50 - 74%    Lower Body Dressing/Undressing Lower body dressing    Lower body dressing activity did not occur: Safety/medical concerns(B wound vac, high AKA) What is the patient wearing?: Incontinence brief, Pants     Lower body assist Assist for lower body dressing: Moderate Assistance - Patient 50 - 74%     Toileting Toileting Toileting Activity did not occur (Clothing management and hygiene only): N/A (no void or bm)  Toileting assist Assist for toileting: Moderate Assistance - Patient 50 - 74%     Transfers Chair/bed transfer  Transfers assist  Chair/bed transfer activity did not occur: Refused  Chair/bed transfer assist level: Contact Guard/Touching assist     Locomotion Ambulation   Ambulation assist   Ambulation activity did not occur: N/A          Walk 10 feet activity  Assist  Walk 10 feet activity did not occur: N/A        Walk 50 feet activity   Assist Walk 50 feet with 2 turns activity did not occur: N/A         Walk 150 feet activity   Assist Walk 150 feet activity did not occur: N/A         Walk 10 feet on uneven surface  activity   Assist Walk 10 feet on uneven surfaces activity did not occur: N/A         Wheelchair     Assist Will patient use  wheelchair at discharge?: Yes Type of Wheelchair: Manual Wheelchair activity did not occur: Refused  Wheelchair assist level: Supervision/Verbal cueing Max wheelchair distance: 172f    Wheelchair 50 feet with 2 turns activity    Assist    Wheelchair 50 feet with 2 turns activity did not occur: Refused   Assist Level: Supervision/Verbal cueing   Wheelchair 150 feet activity     Assist  Wheelchair 150 feet activity did not occur: Refused   Assist Level: Supervision/Verbal cueing   Blood pressure 121/86, pulse 87, temperature 98.8 F (37.1 C), temperature source Oral, resp. rate 18, SpO2 100 %.  Medical Problem List and Plan: 1.  Deficits with mobility, transfers, self-care secondary to bilateral AKA revision.            Continue CIR  Doxycycline through 2/11 2.  Antithrombotics: -DVT/anticoagulation:  Pharmaceutical: Lovenox             -antiplatelet therapy: N/A 3. Chronic pain/Pain Management:   Norco to q4 hours prn, changed to oxycodone every 4 as needed-will wean prior to discharge  Gabapentin 300 mg QHS for nerve pain, increased to 3 times daily on 2/9             Monitor with increased mobility 4. Mood: LCSW to follow for evaluation and support.              -antipsychotic agents: N/A 5. Neuropsych: This patient is capable of making decisions on her own behalf. 6. Skin/Wound Care: Monitor wounds for healing   Rheumatology consult unavailable, will attempt to consult ID vs Derm regarding right upper extremity lesions  Plan to DC VAC on 2/10 7. Fluids/Electrolytes/Nutrition: Monitor I/O.   8. HTN: Monitor BP   Continue Norvasc.   Controlled on 2/9             Monitor with increased mobility. 9. Leucocytosis: Resolved  Continue to monitor for signs of infection.  10. Hypokalemia:   Potassium 4.7 on 2/8  Supplement DC'd 11.  Transaminitis  LFTs elevated, but improving on 2/6 12. Tobacco use/Cocaine abuse: Continue to educate on importance of cessation.   13.  Hypoalbuminemia  Supplement initiated 14.  Hyponatremia  Sodium 134 on 2/8  Continue to monitor 15.  Anemia of chronic disease  Hemoglobin 10.3 on 2/8  Continue to monitor  LOS: 4 days A FACE TO FACE EVALUATION WAS PERFORMED  Wolfgang Finigan ALorie Phenix2/06/2020, 8:23 AM

## 2019-11-15 ENCOUNTER — Inpatient Hospital Stay (HOSPITAL_COMMUNITY): Payer: Medicaid Other

## 2019-11-15 ENCOUNTER — Inpatient Hospital Stay (HOSPITAL_COMMUNITY): Payer: Medicaid Other | Admitting: Occupational Therapy

## 2019-11-15 DIAGNOSIS — E876 Hypokalemia: Secondary | ICD-10-CM

## 2019-11-15 LAB — COMPREHENSIVE METABOLIC PANEL
ALT: 53 U/L — ABNORMAL HIGH (ref 0–44)
AST: 46 U/L — ABNORMAL HIGH (ref 15–41)
Albumin: 2.6 g/dL — ABNORMAL LOW (ref 3.5–5.0)
Alkaline Phosphatase: 122 U/L (ref 38–126)
Anion gap: 9 (ref 5–15)
BUN: 22 mg/dL — ABNORMAL HIGH (ref 6–20)
CO2: 21 mmol/L — ABNORMAL LOW (ref 22–32)
Calcium: 8.9 mg/dL (ref 8.9–10.3)
Chloride: 102 mmol/L (ref 98–111)
Creatinine, Ser: 0.67 mg/dL (ref 0.44–1.00)
GFR calc Af Amer: >60 mL/min (ref >60)
GFR calc non Af Amer: >60 mL/min (ref >60)
Glucose, Bld: 70 mg/dL (ref 70–99)
Potassium: 4.7 mmol/L (ref 3.5–5.1)
Sodium: 132 mmol/L — ABNORMAL LOW (ref 135–145)
Total Bilirubin: 1.5 mg/dL — ABNORMAL HIGH (ref 0.3–1.2)
Total Protein: 6.5 g/dL (ref 6.5–8.1)

## 2019-11-15 LAB — CBC WITH DIFFERENTIAL/PLATELET
Abs Immature Granulocytes: 0.28 10*3/uL — ABNORMAL HIGH (ref 0.00–0.07)
Basophils Absolute: 0 10*3/uL (ref 0.0–0.1)
Basophils Relative: 0 %
Eosinophils Absolute: 0.1 10*3/uL (ref 0.0–0.5)
Eosinophils Relative: 1 %
HCT: 30.8 % — ABNORMAL LOW (ref 36.0–46.0)
Hemoglobin: 9.5 g/dL — ABNORMAL LOW (ref 12.0–15.0)
Immature Granulocytes: 4 %
Lymphocytes Relative: 13 %
Lymphs Abs: 1 10*3/uL (ref 0.7–4.0)
MCH: 25.6 pg — ABNORMAL LOW (ref 26.0–34.0)
MCHC: 30.8 g/dL (ref 30.0–36.0)
MCV: 83 fL (ref 80.0–100.0)
Monocytes Absolute: 1.1 10*3/uL — ABNORMAL HIGH (ref 0.1–1.0)
Monocytes Relative: 14 %
Neutro Abs: 5.4 10*3/uL (ref 1.7–7.7)
Neutrophils Relative %: 68 %
Platelets: 358 10*3/uL (ref 150–400)
RBC: 3.71 MIL/uL — ABNORMAL LOW (ref 3.87–5.11)
RDW: 19.4 % — ABNORMAL HIGH (ref 11.5–15.5)
WBC: 7.9 10*3/uL (ref 4.0–10.5)
nRBC: 0 % (ref 0.0–0.2)

## 2019-11-15 LAB — URINALYSIS, ROUTINE W REFLEX MICROSCOPIC
Bilirubin Urine: NEGATIVE
Glucose, UA: NEGATIVE mg/dL
Hgb urine dipstick: NEGATIVE
Ketones, ur: NEGATIVE mg/dL
Leukocytes,Ua: NEGATIVE
Nitrite: NEGATIVE
Protein, ur: NEGATIVE mg/dL
Specific Gravity, Urine: 1.013 (ref 1.005–1.030)
pH: 7 (ref 5.0–8.0)

## 2019-11-15 MED ORDER — IBUPROFEN 200 MG PO TABS
600.0000 mg | ORAL_TABLET | Freq: Once | ORAL | Status: AC
  Administered 2019-11-15: 20:00:00 600 mg via ORAL
  Filled 2019-11-15: qty 3

## 2019-11-15 MED ORDER — OXYCODONE HCL 5 MG PO TABS
10.0000 mg | ORAL_TABLET | Freq: Four times a day (QID) | ORAL | Status: DC | PRN
  Administered 2019-11-15 – 2019-11-19 (×13): 10 mg via ORAL
  Filled 2019-11-15 (×13): qty 2

## 2019-11-15 NOTE — Progress Notes (Signed)
[facility] PHYSICAL MEDICINE & REHABILITATION PROGRESS NOTE   Subjective/Complaints: Patient seen laying in bed this morning, resting comfortably, upon waking patient up, she states she did not sleep well because she was in pain from her vacs.  Discussed pain management with nursing as well as vac removal.  Encourage patient to work through pain.  ROS: Denies SOB, CP, abd pain; N/V/D.    Objective:   No results found. Recent Labs    11/13/19 0535  WBC 9.7  HGB 10.3*  HCT 33.3*  PLT 341   Recent Labs    11/13/19 0535 11/15/19 0901  NA 134* 132*  K 4.7 4.7  CL 97* 102  CO2 23 21*  GLUCOSE 97 70  BUN 22* 22*  CREATININE 0.70 0.67  CALCIUM 9.1 8.9    Intake/Output Summary (Last 24 hours) at 11/15/2019 1036 Last data filed at 11/15/2019 0726 Gross per 24 hour  Intake 662 ml  Output 1625 ml  Net -963 ml     Physical Exam: Vital Signs Blood pressure (!) 122/58, pulse 90, temperature 98.6 F (37 C), resp. rate 15, SpO2 98 %. Constitutional: No distress . Vital signs reviewed.  Cachectic. HENT: Normocephalic.  Atraumatic. Eyes: EOMI. No discharge. Cardiovascular: No JVD. Respiratory: Normal effort.  No stridor. GI: Non-distended. Skin: Bilateral vacs to stumps. Right upper extremity with 3 circular red lesions, stable, nontender Bilateral hand deformities Psych: Normal mood.  Normal behavior. Musc: No edema in extremities.  No tenderness in extremities. Neurological: Alert Motor: B/l UE: 4/5 proximal to distal, unchanged B/l LE: HF 1/5 (pain inhibition), unchanged  Assessment/Plan: 1. 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  Physiatrist is providing close team supervision and 24 hour management of active medical problems listed below.  Physiatrist and rehab team continue to assess barriers to discharge/monitor  patient progress toward functional and medical goals  Care Tool:  Bathing    Body parts bathed by patient: Right arm, Left arm, Abdomen, Chest, Front perineal area, Face, Right upper leg, Left upper leg   Body parts bathed by helper: Buttocks Body parts n/a: Right lower leg, Left lower leg(B AKA)   Bathing assist Assist Level: Moderate Assistance - Patient 50 - 74%(bed level)     Upper Body Dressing/Undressing Upper body dressing   What is the patient wearing?: Hospital gown only    Upper body assist Assist Level: Moderate Assistance - Patient 50 - 74%    Lower Body Dressing/Undressing Lower body dressing    Lower body dressing activity did not occur: Safety/medical concerns(B wound vac, high AKA) What is the patient wearing?: Incontinence brief, Pants     Lower body assist Assist for lower body dressing: Moderate Assistance - Patient 50 - 74%     Toileting Toileting Toileting Activity did not occur (Clothing management and hygiene only): N/A (no void or bm)  Toileting assist Assist for toileting: Moderate Assistance - Patient 50 - 74%     Transfers Chair/bed transfer  Transfers assist  Chair/bed transfer activity did not occur: Refused  Chair/bed transfer assist level: Contact Guard/Touching assist     Locomotion Ambulation   Ambulation assist   Ambulation activity did not occur: N/A          Walk 10 feet activity   Assist  Walk 10 feet activity did not occur: N/A        Walk  50 feet activity   Assist Walk 50 feet with 2 turns activity did not occur: N/A         Walk 150 feet activity   Assist Walk 150 feet activity did not occur: N/A         Walk 10 feet on uneven surface  activity   Assist Walk 10 feet on uneven surfaces activity did not occur: N/A         Wheelchair     Assist Will patient use wheelchair at discharge?: Yes Type of Wheelchair: Manual Wheelchair activity did not occur: Refused  Wheelchair assist  level: Supervision/Verbal cueing Max wheelchair distance: 156f    Wheelchair 50 feet with 2 turns activity    Assist    Wheelchair 50 feet with 2 turns activity did not occur: Refused   Assist Level: Supervision/Verbal cueing   Wheelchair 150 feet activity     Assist  Wheelchair 150 feet activity did not occur: Refused   Assist Level: Supervision/Verbal cueing   Blood pressure (!) 122/58, pulse 90, temperature 98.6 F (37 C), resp. rate 15, SpO2 98 %.  Medical Problem List and Plan: 1.  Deficits with mobility, transfers, self-care secondary to bilateral AKA revision.            Continue CIR  Doxycycline through 2/11  Team conference today to discuss current and goals and coordination of care, home and environmental barriers, and discharge planning with nursing, case manager, and therapies.  2.  Antithrombotics: -DVT/anticoagulation:  Pharmaceutical: Lovenox             -antiplatelet therapy: N/A 3. Chronic pain/Pain Management:   Norco to q4 hours prn, changed to oxycodone every 4 as needed-plan to wean prior to discharge due to history of substance abuse.  Will monitor once VACs DC'd.  Gabapentin 300 mg QHS for nerve pain, increased to 3 times daily on 2/9             Monitor with increased mobility 4. Mood: LCSW to follow for evaluation and support.              -antipsychotic agents: N/A 5. Neuropsych: This patient is capable of making decisions on her own behalf. 6. Skin/Wound Care: Monitor wounds for healing   Rheumatology consult unavailable, will refer to Derm as outpatient  DC VACs today 7. Fluids/Electrolytes/Nutrition: Monitor I/O.   8. HTN: Monitor BP   Continue Norvasc.   Controlled on 2/10             Monitor with increased mobility. 9. Leucocytosis: Resolved  Continue to monitor for signs of infection.  10. Hypokalemia:   Potassium 4.7 on 2/8, labs ordered for tomorrow  Supplement DC'd 11.  Transaminitis  LFTs elevated, but improving on 2/6,  labs ordered for tomorrow 12. Tobacco use/Cocaine abuse: Continue to educate on importance of cessation.  13.  Hypoalbuminemia  Supplement initiated 14.  Hyponatremia  Sodium 134 on 2/8, labs ordered for tomorrow  Continue to monitor 15.  Anemia of chronic disease  Hemoglobin 10.3 on 2/8  Continue to monitor  LOS: 5 days A FACE TO FACE EVALUATION WAS PERFORMED  Syrianna Schillaci ALorie Phenix2/07/2020, 10:36 AM

## 2019-11-15 NOTE — Progress Notes (Signed)
Occupational Therapy Session Note  Patient Details  Name: [name] MRN: [id number] Date of Birth: May 18, 1961  Today's Date: 11/15/2019 OT Individual Time: 7262-0355 OT Individual Time Calculation (min): 9 min  and Today's Date: 11/15/2019 OT Missed Time: 66 Minutes Missed Time Reason: Pain   Short Term Goals: Week 1:  OT Short Term Goal 1 (Week 1): STG= LTG d/t ELOS  Skilled Therapeutic Interventions/Progress Updates:    Attempted to see pt at [place] with pt reporting excruciating pain and crying out in pain. Reports RN just provided pain meds and requested therapist return later.  Therapist spoke with RN who reports meds were quick releasing and should get quick results.  Therapist returned at [phone] to attempt to encourage pt to participate in any functional transfers, mobility, or self-care tasks.  Pt still crying and reporting that she can't get any pain relief.  Pt reports that she would sleep a lot at home as that was the only time she didn't feel pain.  Pt refusing any tasks even at bed level.  Pt left curled up in middle of bed with blankets pulled up over body and towel at head for tears. Pt requesting therapist attempt to return later in day.  Will follow per POC and attempt as pt/therapist availability allows.  Therapy Documentation Precautions:  Precautions Precautions: Fall Precaution Comments: B wound vacs Restrictions Weight Bearing Restrictions: Yes RLE Weight Bearing: Non weight bearing LLE Weight Bearing: Non weight bearing General: General OT Amount of Missed Time: 66 Minutes Pain: Pain Assessment Pain Scale: 0-10 Pain Score: 10-Worst pain ever Pain Type: Surgical pain Pain Location: Leg Pain Orientation: Right;Left Pain Descriptors / Indicators: Aching Pain Onset: On-going Patients Stated Pain Goal: 3 Pain Intervention(s): Medication (See eMAR)   Therapy/Group: Individual Therapy  Simonne Come 11/15/2019, 10:18 AM

## 2019-11-15 NOTE — Progress Notes (Signed)
Physical Therapy Session Note  Patient Details  Name: [name] MRN: [id number] Date of Birth: 05-14-1961  Today's Date: 11/15/2019 PT Individual Time: 1030-1043 and 1310-1320 PT Individual Time Calculation (min): 13 min 10 min and Today's Date: 11/15/2019 PT Missed Time: 47 Minutes Missed Time Reason: Pain;Patient unwilling to participate  Short Term Goals: Week 1:  PT Short Term Goal 1 (Week 1): STG=LTG due to ELOS  Skilled Therapeutic Interventions/Progress Updates:   Treatment Session 1: 1030-1043 13 min Received pt supine in bed asleep, pt woken upon therapist's arrival and reported pain 7/10 in bilateral LE's. Pt very emotional crying due to pain. Therapist notified RN of pt's current pain status, however pt just received pain medication. Pt refusing to get OOB due to increased pain levels. Therapist educated pt on positioning techniques, importance of OOB mobility, and suggested bed level exercises; however pt refused again. Therapist assisted pt with repositioning in bed, however pt ultimately unable to get comfortable. Pt stated "I'm going to try my hardest to work with you this afternoon when you come back." Concluded session with pt supine in bed, needs within reach, and bed alarm on. Therapist provided ice cream to patient. 47 minutes missed of skilled physical therapy due to pain/unwillingness to participate.    Treatment Session 2: 1310-1320 10 min Received pt supine in bed with NT present checking vitals. NT with difficulty getting pt's O2 saturation, therapist assisted with trying different pulse oximeter, warming fingers, and switching fingers. Pt reported she felt much better after getting her wound vacs off, but when therapist suggested getting OOB, pt immediately stated "well my legs still really hurt." Therapist educated pt that some discomfort after surgery was normal and on the importance of mobility for improved cardiovascular, circulatory, respiratory, pain reduction, and  musculoskeletal benefits. Pt responded saying "can you come back in the morning?" Therapist educated pt on therapy schedule and need to see other patients and explained importance of using therapy time efficiently. Therapist suggested bed level exercises however pt continued to politely decline. Therapist verbally talked through exercises for pt to perform while laying in bed as pain allowed (including hip flexion, abduction, and bridges) and pt very receptive and agreeable to perform later. Therapist educated pt on desensitization techniques and importance of changing positions frequently; pt receptive to all education. Concluded session with pt supine in bed, needs within reach, and bed alarm on. RN notified of current status. 50 minutes missed of skilled physical therapy due to pain/refusal to participate.   Therapy Documentation Precautions:  Precautions Precautions: Fall Precaution Comments: B wound vacs Restrictions Weight Bearing Restrictions: Yes RLE Weight Bearing: Non weight bearing LLE Weight Bearing: Non weight bearing   Therapy/Group: Individual Therapy Alfonse Alpers PT, DPT   11/15/2019, 7:36 AM

## 2019-11-15 NOTE — Patient Care Conference (Signed)
Inpatient RehabilitationTeam Conference and Plan of Care Update Date: 11/15/2019   Time: 11:40 AM   Patient Name: [name]      Medical Record Number: [id number]  Date of Birth: 07-09-1961 Sex: Female         Room/Bed: 4M04C/4M04C-01 Payor Info: Payor: MEDICAID [place] / Plan: MEDICAID [place] / Product Type: *No Product type* /    Admit Date/Time:  11/10/2019  5:16 PM  Primary Diagnosis:  Debility  Patient Active Problem List   Diagnosis Date Noted  . Anemia of chronic disease   . Hyponatremia   . Hypoalbuminemia due to protein-calorie malnutrition ([place])   . Debility 11/10/2019  . S/P AKA (above knee amputation) bilateral ([place]) 11/10/2019  . Wound infection   . Transaminitis   . Hypokalemia   . Leukocytosis   . Benign essential HTN   . Chronic pain syndrome   . Stump pain ([place]) 09/27/2019  . Anemia   . Cellulitis 09/26/2019  . Hyperglycemia 08/27/2019  . Disability of walking 05/28/2019  . At risk for adverse drug event 08/30/2018  . Microcytic hypochromic anemia 08/30/2018  . Thrombocytopenia ([place]) 08/20/2018  . Toxic encephalopathy 08/20/2018  . Goals of care, counseling/discussion   . Palliative care by specialist   . DNR (do not resuscitate) discussion   . Infiltrate noted on imaging study   . Ulcer of right thigh, limited to breakdown of skin ([place])   . S/P bilateral above knee amputation ([place])   . Homelessness 07/30/2018  . Elevated LFTs 07/30/2018  . Osteomyelitis ([place]) 07/30/2018  . Acute encephalopathy 07/30/2018  . Eye swelling, bilateral 07/30/2018  . Skin rash 07/11/2018  . Acute on chronic diastolic CHF (congestive heart failure) ([place]) 06/21/2018  . Malnutrition of moderate degree 05/20/2018  . Acute congestive heart failure ([place])   . Suicidal ideation 02/02/2018  . HCAP (healthcare-associated pneumonia) 02/02/2018  . Acute on chronic respiratory failure with hypoxia ([place]) 02/02/2018  . Sepsis ([place]) 02/02/2018  . Dehiscence of amputation stump ([place])  10/23/2017  . Cocaine abuse with cocaine-induced mood disorder ([place]) 08/10/2017  . Hypertensive crisis   . Phantom limb pain ([place])   . Tobacco abuse   . Amputation stump pain ([place])   . Acute blood loss anemia   . Atherosclerosis of native arteries of extremities with gangrene, bilateral legs ([place])   . AKI (acute kidney injury) ([place]) 02/07/2017  . Acute kidney injury ([place]) 02/06/2017  . Wound healing, delayed   . Chest pain 04/07/2015  . Protein-calorie malnutrition, severe ([place]) 01/15/2015  . Cocaine abuse ([place])   . MDD (major depressive disorder), recurrent episode, severe ([place]) 10/16/2014  . Cocaine use disorder, severe, dependence ([place]) 10/10/2014  . 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 Essential hypertension 02/26/2010    Expected Discharge Date: Expected Discharge Date: 11/21/19  Team Members Present: Physician leading conference: Dr. Delice Lesch Social Worker Present: Lennart Pall, LCSW Nurse Present: Rayetta Pigg, RN;Deborah Hervey Ard, RN Case Manager: Karene Fry, RN PT Present: Becky Sax, PT OT Present: Simonne Come, OT SLP Present: Colon Flattery, SLP PPS Coordinator present : Gunnar Fusi, Novella Olive, PT     Current Status/Progress Goal Weekly Team Focus  Bowel/Bladder   Pt is continent of bowel and bladder  Maintain continence  Assist with toileting as needed   Swallow/Nutrition/ Hydration             ADL's   Still has not left bed with OT due to pain and fatigue.  Has completed toilet  transfer A/P with Min assist to [place].  Donned pants at bed level with min-mod assist  Supervision/setup bathing, dressing, toileting/transfers  pain tolerance/management, activity tolerance, pt education, d/c planning   Mobility   bed mobility CGA, AP transfers CGA, WC mobility 147f supervision  Supervision  functional mobility/transfers, UE/LE strength, dynamic sitting balance,  improved activity tolerance   Communication             Safety/Cognition/  Behavioral Observations            Pain   Pt c.o pain 8-10 out of 10 to [place] AKA  Maintain pain 4 or less  Assess pain every shift/ provide PRN pain meds when needed   Skin   Abrasions and ecchymosis to BUE, MASD to gorin  Prevent further skin breakdown  Reposition as tolerated, Assess skin every shift    Rehab Goals Patient on target to meet rehab goals: Yes *See Care Plan and progress notes for long and short-term goals.     Barriers to Discharge  Current Status/Progress Possible Resolutions Date Resolved   Nursing                  PT  Inaccessible home environment;Decreased caregiver support;Medical stability;Home environment access/layout;Wound Care;Lack of/limited family support;Insurance for SNF coverage;Weight bearing restrictions;Medication compliance                 OT                  SLP                SW Inaccessible home environment;Decreased caregiver support 3 step entry to home; using a door to roll wheelchair over steps to entry Brother notified of need to locate previous DME patient received at previous hospitalization          Discharge Planning/Teaching Needs:  Return to boarding home with her brother  TBD   Team Discussion: Wound VAC off today.  RN L wound VAC off, staples and sutures are in place, a lot of pain.  OT not participating, limited by pain, S goals, not getting OOB.  PT CGA AP transfers and bed, 150' S w/c mobility, S goals, needs a slide board, lives with a brother.   Revisions to Treatment Plan: N/A     Medical Summary Current Status: Deficits with mobility, transfers, self-care secondary to bilateral AKA revision Weekly Focus/Goal: Improve mobility, pain coping, improve lesions, electrolytes, wounds  Barriers to Discharge: Other (comments);Medical stability;Behavior;Weight bearing restrictions;Wound care  Barriers to Discharge Comments: Pain coping Possible Resolutions to Barriers: Therapies, educate on coping, follow labs, d/c  VACs   Continued Need for Acute Rehabilitation Level of Care: The patient requires daily medical management by a physician with specialized training in physical medicine and rehabilitation for the following reasons: Direction of a multidisciplinary physical rehabilitation program to maximize functional independence : Yes Medical management of patient stability for increased activity during participation in an intensive rehabilitation regime.: Yes Analysis of laboratory values and/or radiology reports with any subsequent need for medication adjustment and/or medical intervention. : Yes   I attest that I was present, lead the team conference, and concur with the assessment and plan of the team.   LRetta Diones2/07/2020, 9:33 PM   Team conference was held via web/ [place]conference [place] to [place]

## 2019-11-15 NOTE — Plan of Care (Signed)

## 2019-11-15 NOTE — Progress Notes (Addendum)
Upon entering room, patient showed me that she had removed the other vac dressing (on her right side) independently.  She said the pain was better this time because she was able to control the removal process.  The incision to her right stump is much like her left stump - CDI with staples and sutures. She reports that the pain in both limbs are better now that the vacs have been discontinued.  Spoke with Algis Liming, PA to inquire about any dressing orders post vac removal.  No new dressing order received as incisions both CDI without drainage.     Brita Romp, RN

## 2019-11-15 NOTE — Progress Notes (Signed)
Received a call from [place] at 19:44, reporting [name] has a oral temp of 103. Will order Blood culture x2, urine culture, CXR and CBC with Diff.  [name] is allergic to tylenol, Motrin 600 mg ordered.  [name] refuses a rectal Temperature. [name] will check her temperature in 30 minutes. Continue with ice packs. We will continue to monitor.

## 2019-11-15 NOTE — Progress Notes (Signed)
Removed left leg wound vac per MD order.  Patient experienced significant pain during removal due to dressing extending over pubic hair.  Incision CDI with staples and sutures.  Some areas of discoloration, similar to other areas on arms, noted at stump.  Patient requested to delay the removal of the vac on right leg to allow pain to resolve on left leg first.  Brita Romp, RN

## 2019-11-15 NOTE — Progress Notes (Addendum)
Team Conference Report to Patient/Family  Team Conference discussion was reviewed with the patient and caregiver [name]), including goals, any changes in plan of care and target discharge date of 11/21/19 at a supervision overall level.  Patient and caregiver express understanding and are in agreement.  Family education set up for 11/20/19 @ 1300. Reinforced the need for patient to participate in therapy sessions in order to reach identified goals for the discharge. Patient will require PTAR transport home at discharge. Message left for the patient's brother to contact the CM/SW to review team conference details with him as well.  [name] 11/15/2019, 2:17 PM

## 2019-11-16 ENCOUNTER — Inpatient Hospital Stay (HOSPITAL_COMMUNITY): Payer: Medicaid Other

## 2019-11-16 ENCOUNTER — Inpatient Hospital Stay (HOSPITAL_COMMUNITY): Payer: Medicaid Other | Admitting: Occupational Therapy

## 2019-11-16 LAB — COMPREHENSIVE METABOLIC PANEL
ALT: 47 U/L — ABNORMAL HIGH (ref 0–44)
AST: 41 U/L (ref 15–41)
Albumin: 2.7 g/dL — ABNORMAL LOW (ref 3.5–5.0)
Alkaline Phosphatase: 114 U/L (ref 38–126)
Anion gap: 15 (ref 5–15)
BUN: 25 mg/dL — ABNORMAL HIGH (ref 6–20)
CO2: 23 mmol/L (ref 22–32)
Calcium: 9.5 mg/dL (ref 8.9–10.3)
Chloride: 99 mmol/L (ref 98–111)
Creatinine, Ser: 0.75 mg/dL (ref 0.44–1.00)
GFR calc Af Amer: >60 mL/min (ref >60)
GFR calc non Af Amer: >60 mL/min (ref >60)
Glucose, Bld: 105 mg/dL — ABNORMAL HIGH (ref 70–99)
Potassium: 4.7 mmol/L (ref 3.5–5.1)
Sodium: 137 mmol/L (ref 135–145)
Total Bilirubin: 1.3 mg/dL — ABNORMAL HIGH (ref 0.3–1.2)
Total Protein: 6.6 g/dL (ref 6.5–8.1)

## 2019-11-16 LAB — URINE CULTURE: Culture: NO GROWTH

## 2019-11-16 NOTE — Care Management (Signed)
[name], female   DOB: 23-May-1961, 59 y.o.   MRN: [id number] Attempted to contact patient's brother with an update on patient progress/team conference without success at either number on the chart [phone]) or given verbally by the patient [phone]) yesterday. Patient noted her friend will assist her at discharge in the home and the friend was in the hospital and agreed. Family education set up with the friend for Monday 11/20/19 and will include brother once contacted. Called numbers again today without success to speak with someone and left a message for the person to call us back .

## 2019-11-16 NOTE — Progress Notes (Signed)
Physical Therapy Session Note  Patient Details  Name: [name] MRN: [id number] Date of Birth: 1960/11/29  Today's Date: 11/16/2019 PT Individual Time: 0800-0910 and 1400-1553 PT Individual Time Calculation (min): 70 min and 53 min  Short Term Goals: Week 1:  PT Short Term Goal 1 (Week 1): STG=LTG due to ELOS  Skilled Therapeutic Interventions/Progress Updates:   Treatment Session 1: 0800-0910 70 min Received pt supine in bed, pt agreeable to therapy after convincing, and did not report any pain. Pt stated "it's a little early for therapy", however agreeable after convincing and education on importance of participation. Session focused on functional mobility/transfers, UE/LE strength, simulated car transfers, dynamic sitting balance, and improved activity tolerance. Pt donned pants supine in bed with supervision and scrub top sitting in bed with supervision. Pt performed PA transfer bed<>WC with supervision. Pt performed WC mobility 154f using bilateral UE with supervision. Pt performed simulated car transfer with slideboard x1 with CGA and verbal cues for technique. However, pt with her own preferred method for using the slideboard despite therapist's recommendations. Pt performed simulated car transfer x 1 using an AP/PA method without device and heavy supervision. Pt relied heavily on use of overhead grab bar to propel herself forward into the car. Pt required verbal cues for safety, but verbalized that she has her own way of doing things. Pt reported urge to use restroom and [facility] Pt doffed brief and pants sitting in WC with supervision. Pt transferred AP from WC<>commode with CGA and turned around on commode to void. Pt continent of bowel and bladder, nursing made aware. Pt able to perform hygiene with supervision. Pt transferred PA commode<>WC with CGA and required min A to don incontinence brief and shorts sitting in WC. Pt suddenly felt nauseous and  vomited a large amount in commode. Therapist provided pt with cool washcloth and ice chips; pt grateful. Pt reported feel better after vomiting and agreeable to continue therapy. RN made aware and provided anti-nausea medication. Pt washed hands seated in WC at sink with supervision. [facility] WC<>mat with supervision. Pt performed seated tricep extension 2x8 and seated volleyball with 3lb dowel 3x10 with supervision. Pt transferred sit<>supine<>sidelying with supervision. Attempted to lie in prone however, pt reporting increased pain from staples and requested to lie in sidelying instead. Pt performed sidelying hip abduction 2x10 bilaterally. Pt compensating using hip flexors, and unable to extend hip despite verbal cues. Pt performed supine bridges using bolster 2x10 with verbal cues for technique. Pt transferred supine<>sit supervision. Pt performed PA transfer from mat<>WC with supervision and was transported back to room total assist. Pt transferred AP from WC<>bed. Concluded session with pt sitting in bed, needs within reach, and bed alarm on.   Treatment Session 1: [id number] min Received pt supine in bed, pt initially not agreeable to therapy stating "i'm too tired" but agreed after convincing. Pt reported pain but did not state pain number, RN made aware and stated pt could not receive any more pain medication for 4 hours, but brought ice packs. Pt reported urge to use restroom and performed PA transfer bed<>WC supervision. Pt doffed incontinence brief and shorts sitting in WC with supervision and performed AP transfer WC<>commode CGA and turned around on commode. Pt continent of bowel and bladder. Pt performed hygiene with supervision transferred PA from commode<>WC CGA. Pt required min A to don clean incontinence brief and shorts. Pt performed  WC mobility 124f with bilateral UE and supervision. Pt performed bilateral UE strengthening on UBE forwards x 3  minutes 50 seconds and backwards x 2 min on level 2.5. Pt required multiple rest breaks throughout session due to increased fatigue. Pt performed WC mobility on uneven surfaces (ramp) 3x197fwith supervision. Pt performed AP transfer WC<>mat with supervision and sit<>supine with supervision. Pt attempted to lie completely flat in supine however only able to tolerate 3 minutes due to increased pain and difficulty relaxing LEs. Pt tends to hold bilateral LE's in hip flexion despite cues for hip extension. Pt performed sidelying hip extension 2x5. Pt only able to get to neutral with hip extension due to extremely tight hip flexors. Discussed prosthetic use with patient and pt stating "I should have gotten them a long time ago", But pt states she she does have plans to get them eventually. Mat<>WC PA transfer with supervision. Concluded session with pt supine in bed, needs within reach, and bed alarm on.    Therapy Documentation Precautions:  Precautions Precautions: Fall Precaution Comments: B wound vacs Restrictions Weight Bearing Restrictions: Yes RLE Weight Bearing: Non weight bearing LLE Weight Bearing: Non weight bearing   Therapy/Group: Individual Therapy AnAlfonse AlpersT, DPT   11/16/2019, 7:30 AM

## 2019-11-16 NOTE — Progress Notes (Signed)
PHYSICAL MEDICINE & REHABILITATION PROGRESS NOTE   Subjective/Complaints: Patient seen after returning from gym with PT. PT Vicente Males says patient did very well in session, practicing car transfers with supervision. Patient reports some residual pain, but mostly residual and phantom limb pain are well controlled.   ROS: Denies SOB, CP, abd pain; N/V/D.    Objective:   DG CHEST PORT 1 VIEW  Result Date: 11/15/2019 CLINICAL DATA:  Fever EXAM: PORTABLE CHEST 1 VIEW COMPARISON:  09/26/2019 FINDINGS: Cardiomegaly. Aortic atherosclerosis. Linear scarring in the right upper lobe, stable. No acute confluent opacities or effusions. No acute bony abnormality. IMPRESSION: Cardiomegaly.  No acute cardiopulmonary disease. Electronically Signed   By: Rolm Baptise M.D.   On: 11/15/2019 20:26   Recent Labs    11/15/19 2024  WBC 7.9  HGB 9.5*  HCT 30.8*  PLT 358   Recent Labs    11/15/19 0901 11/16/19 0556  NA 132* 137  K 4.7 4.7  CL 102 99  CO2 21* 23  GLUCOSE 70 105*  BUN 22* 25*  CREATININE 0.67 0.75  CALCIUM 8.9 9.5    Intake/Output Summary (Last 24 hours) at 11/16/2019 0900 Last data filed at 11/16/2019 0800 Gross per 24 hour  Intake 980 ml  Output 1270 ml  Net -290 ml     Physical Exam: Vital Signs Blood pressure 107/85, pulse 83, temperature 98.3 F (36.8 C), resp. rate 19, SpO2 98 %. Constitutional: No distress . Vital signs reviewed.  Cachectic. In Wheelchair returning from therapy.  HENT: Normocephalic.  Atraumatic. Eyes: EOMI. No discharge. Cardiovascular: No JVD. Respiratory: Normal effort.  No stridor. GI: Non-distended. Skin: Bilateral vacs to stumps. Right upper extremity with 3 circular red lesions, stable, nontender, healing well.  Bilateral hand deformities Psych: Normal mood.  Normal behavior. Musc: No edema in extremities.  No tenderness in extremities. Neurological: Alert Motor: B/l UE: 4/5 proximal to distal, unchanged B/l LE: HF 1/5 (pain  inhibition), unchanged  Assessment/Plan: 1. 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  Physiatrist is providing close team supervision and 24 hour management of active medical problems listed below.  Physiatrist and rehab team continue to assess barriers to discharge/monitor patient progress toward functional and medical goals  Care Tool:  Bathing    Body parts bathed by patient: Right arm, Left arm, Abdomen, Chest, Front perineal area, Face, Right upper leg, Left upper leg   Body parts bathed by helper: Buttocks Body parts n/a: Right lower leg, Left lower leg(B AKA)   Bathing assist Assist Level: Moderate Assistance - Patient 50 - 74%(bed level)     Upper Body Dressing/Undressing Upper body dressing   What is the patient wearing?: Hospital gown only    Upper body assist Assist Level: Moderate Assistance - Patient 50 - 74%    Lower Body Dressing/Undressing Lower body dressing    Lower body dressing activity did not occur: Safety/medical concerns(B wound vac, high AKA) What is the patient wearing?: Incontinence brief, Pants     Lower body assist Assist for lower body dressing: Moderate Assistance - Patient 50 - 74%     Toileting Toileting Toileting Activity did not occur (Clothing management and hygiene only): N/A (no void or bm)  Toileting assist Assist for toileting: Moderate Assistance - Patient 50 - 74%     Transfers Chair/bed transfer  Transfers assist  Chair/bed transfer activity  did not occur: Refused  Chair/bed transfer assist level: Contact Guard/Touching assist     Locomotion Ambulation   Ambulation assist   Ambulation activity did not occur: N/A          Walk 10 feet activity   Assist  Walk 10 feet activity did not occur: N/A        Walk 50 feet activity   Assist Walk 50 feet with 2  turns activity did not occur: N/A         Walk 150 feet activity   Assist Walk 150 feet activity did not occur: N/A         Walk 10 feet on uneven surface  activity   Assist Walk 10 feet on uneven surfaces activity did not occur: N/A         Wheelchair     Assist Will patient use wheelchair at discharge?: Yes Type of Wheelchair: Manual Wheelchair activity did not occur: Refused  Wheelchair assist level: Supervision/Verbal cueing Max wheelchair distance: 166f    Wheelchair 50 feet with 2 turns activity    Assist    Wheelchair 50 feet with 2 turns activity did not occur: Refused   Assist Level: Supervision/Verbal cueing   Wheelchair 150 feet activity     Assist  Wheelchair 150 feet activity did not occur: Refused   Assist Level: Supervision/Verbal cueing   Blood pressure 107/85, pulse 83, temperature 98.3 F (36.8 C), resp. rate 19, SpO2 98 %.  Medical Problem List and Plan: 1.  Deficits with mobility, transfers, self-care secondary to bilateral AKA revision.            Continue CIR PT and OT  Doxycycline through 2/11 2.  Antithrombotics: -DVT/anticoagulation:  Pharmaceutical: Lovenox             -antiplatelet therapy: N/A 3. Chronic pain/Pain Management:   Norco to q4 hours prn, changed to oxycodone every 4 as needed-plan to wean prior to discharge due to history of substance abuse.  Will monitor once VACs DC'd.  Gabapentin 300 mg QHS for nerve pain, increased to 3 times daily on 2/9  2/10: Pain is better controlled with above changes.              Monitor with increased mobility 4. Mood: LCSW to follow for evaluation and support.              -antipsychotic agents: N/A 5. Neuropsych: This patient is capable of making decisions on her own behalf. 6. Skin/Wound Care: Monitor wounds for healing   Rheumatology consult unavailable, will refer to Derm as outpatient  DC VAC yesterday; tolerated well.  7. Fluids/Electrolytes/Nutrition: Monitor  I/O.   8. HTN: Monitor BP   Continue Norvasc.   2/11: well controlled.              Monitor with increased mobility. 9. Leucocytosis: Resolved  Continue to monitor for signs of infection.  10. Hypokalemia:   Potassium 4.7 on 2/8, labs ordered for tomorrow  2/11: K+ stable.   Supplement DC'd 11.  Transaminitis  LFTs elevated, but improving on 2/6, labs ordered for tomorrow 12. Tobacco use/Cocaine abuse: Continue to educate on importance of cessation.  13.  Hypoalbuminemia  Supplement initiated 14.  Hyponatremia  Sodium 134 on 2/8, labs ordered for tomorrow  2/11: Na stable  Continue to monitor 15.  Anemia of chronic disease  Hemoglobin 10.3 on 2/8  Continue to monitor  LOS: 6 days A FACE TO FACE EVALUATION WAS PERFORMED  Asriel Westrup P Corynne Scibilia 11/16/2019, 9:00 AM

## 2019-11-16 NOTE — Plan of Care (Signed)
  Problem: Consults Goal: RH LIMB LOSS PATIENT EDUCATION Description: Description: See Patient Education module for eduction specifics. Outcome: Progressing Goal: Skin Care Protocol Initiated - if Braden Score 18 or less Description: If consults are not indicated, leave blank or document N/A Outcome: Progressing Goal: Nutrition Consult-if indicated Outcome: Progressing Goal: Diabetes Guidelines if Diabetic/Glucose > 140 Description: If diabetic or lab glucose is > 140 mg/dl - Initiate Diabetes/Hyperglycemia Guidelines & Document Interventions  Outcome: Progressing   Problem: RH BOWEL ELIMINATION Goal: RH STG MANAGE BOWEL WITH ASSISTANCE Description: STG Manage Bowel with Moderate Assistance. Outcome: Progressing   Problem: RH BLADDER ELIMINATION Goal: RH STG MANAGE BLADDER WITH ASSISTANCE Description: STG Manage Bladder With Moderate Assistance Outcome: Progressing   Problem: RH SKIN INTEGRITY Goal: RH STG SKIN FREE OF INFECTION/BREAKDOWN Description: Patient will verbalize ways to prevent skin breakdown while hospitalized.  Outcome: Progressing Goal: RH STG MAINTAIN SKIN INTEGRITY WITH ASSISTANCE Description: STG Maintain Skin Integrity With [place] Walk. Outcome: Progressing Goal: RH STG ABLE TO PERFORM INCISION/WOUND CARE W/ASSISTANCE Description: STG Able To Perform Incision/Wound Care With Maximum Assistance. Outcome: Progressing   Problem: RH SAFETY Goal: RH STG ADHERE TO SAFETY PRECAUTIONS W/ASSISTANCE/DEVICE Description: STG Adhere to Safety Precautions With Moderate Assistance/Device. Outcome: Progressing Goal: RH STG DECREASED RISK OF FALL WITH ASSISTANCE Description: STG Decreased Risk of Fall With Moderate Assistance. Outcome: Progressing   Problem: RH PAIN MANAGEMENT Goal: RH STG PAIN MANAGED AT OR BELOW PT'S PAIN GOAL Description: Patient will verbalize how to manage pain by discharge. Outcome: Progressing   Problem: RH KNOWLEDGE DEFICIT LIMB  LOSS Goal: RH STG INCREASE KNOWLEDGE OF SELF CARE AFTER LIMB LOSS Description: Patient will verbalize how to care for limbs after discharge. Outcome: Progressing

## 2019-11-16 NOTE — Progress Notes (Signed)
Physical Therapy Session Note  Patient Details  Name: [name] MRN: [id number] Date of Birth: April 02, 1961  Today's Date: 11/16/2019 PT Individual Time: 5701-7793 PT Individual Time Calculation (min): 53 min   Short Term Goals: Week 1:  PT Short Term Goal 1 (Week 1): STG=LTG due to ELOS  Skilled Therapeutic Interventions/Progress Updates:     Patient in bed upon PT arrival. Patient alert and agreeable to PT session. Patient reported 5-7/10 B residual limb pain during session, RN made aware. PT provided repositioning, rest breaks, and distraction as pain interventions throughout session. Focused session on amputee education while patient ate lunch to make up missed therapy time. Patient performed supine to sit with supervision with use of hospital bed functions. She sat EOB with supervision for sitting balance and ate lunch while PT provided education on pain management, desensitization, phantom pain, positioning in the chair and in bed, pressure relief, importance of hip ROM and performing 15 min of lying flat in supine to stretch hips until able to tolerate lying in prone, and on limb wrapping purpose and benefits. Patient was receptive to all education and use teach back method to recall previously learned information throughout. Patient declined wrapping B limbs at this time, but agreed to try wrapping them in a later session this afternoon, therapist informed. Discussed home and community mobiilty and activities. Patient utilized public transportation and goes out with her brother into the community. Discussed safety strategies for community mobility during the pandemic and energy conservation techniques in the community.  Patient performed sit to supine with supervision with use of hospital bed functions. Patient in bed at end of session with breaks locked, bed alarm set, and all needs within reach.    Therapy Documentation Precautions:  Precautions Precautions: Fall Precaution Comments: B  wound vacs Restrictions Weight Bearing Restrictions: Yes RLE Weight Bearing: Non weight bearing LLE Weight Bearing: Non weight bearing    Therapy/Group: Individual Therapy  Syenna Nazir L Jazmine Longshore PT, DPT  11/16/2019, 4:31 PM

## 2019-11-16 NOTE — Progress Notes (Signed)
Occupational Therapy Session Note  Patient Details  Name: [name] MRN: [id number] Date of Birth: April 29, 1961  Today's Date: 11/16/2019 OT Individual Time: 0349-1791 OT Individual Time Calculation (min): 55 min    Short Term Goals: Week 1:  OT Short Term Goal 1 (Week 1): STG= LTG d/t ELOS  Skilled Therapeutic Interventions/Progress Updates:    Upon entering the room, pt supine in bed and sleeping soundly. Pt very pleasant during session. OT reviewed scheduled with her so she is aware she has additional sessions scheduled today and knows expectations. Pt verbalized feeling much better and has goals for herself this year. Pt asking several questions about when staples will be removed and process for possible prosthetic. OT providing education on these topics and answering questions. OT providing pt with inspection mirror which she utilized to check sink of B residual limbs. Pt eating breakfast while sitting in bed with supervision for balance. Pt also demonstrated B UE strengthening exercises with use of orange level 2 resistive therabands with min cuing for technique. Pt remained in bed at end of session with call bell and all needed items within reach.   Therapy Documentation Precautions:  Precautions Precautions: Fall Precaution Comments: B wound vacs Restrictions Weight Bearing Restrictions: Yes RLE Weight Bearing: Non weight bearing LLE Weight Bearing: Non weight bearing General:   Vital Signs: Therapy Vitals Pulse Rate: 83 BP: 107/85 Pain: Pain Assessment Pain Scale: 0-10 Pain Score: 8  Pain Type: Surgical pain;Acute pain Pain Location: Other (Comment)(stump) Pain Orientation: Right;Left Pain Descriptors / Indicators: Aching Pain Frequency: Intermittent Pain Onset: On-going Patients Stated Pain Goal: 2 Pain Intervention(s): Medication (See eMAR) ADL: ADL Eating: Modified independent Where Assessed-Eating: Bed level Grooming: Modified independent Where  Assessed-Grooming: Bed level Upper Body Bathing: Supervision/safety Where Assessed-Upper Body Bathing: Bed level Lower Body Bathing: Minimal assistance Where Assessed-Lower Body Bathing: Bed level Upper Body Dressing: Minimal assistance Where Assessed-Upper Body Dressing: Bed level Lower Body Dressing: Unable to assess Toileting: Unable to assess   Therapy/Group: Individual Therapy  Gypsy Decant 11/16/2019, 10:30 AM

## 2019-11-17 ENCOUNTER — Inpatient Hospital Stay (HOSPITAL_COMMUNITY): Payer: Medicaid Other | Admitting: Occupational Therapy

## 2019-11-17 ENCOUNTER — Inpatient Hospital Stay (HOSPITAL_COMMUNITY): Payer: Medicaid Other

## 2019-11-17 MED ORDER — AMLODIPINE BESYLATE 5 MG PO TABS
5.0000 mg | ORAL_TABLET | Freq: Every day | ORAL | Status: DC
  Administered 2019-11-18 – 2019-11-21 (×4): 5 mg via ORAL
  Filled 2019-11-17 (×5): qty 1

## 2019-11-17 NOTE — Care Management (Signed)
[name], female   DOB: 08-30-1961, 59 y.o.   MRN: [id number] Attempted to contact brother and was able to get through and speak with another family member who noted equipment is in the home and stated an understanding of discharge plan to home on Tuesday 11/21/19. Family member instructed to call back if they have questions in regards to pending discharge. Family education set up for Monday with her friend.

## 2019-11-17 NOTE — Progress Notes (Signed)
[place] PHYSICAL MEDICINE & REHABILITATION PROGRESS NOTE   Subjective/Complaints: Patient sleeping in bed this morning, easily arousable. Turns on light herself. Says she has been feeling residual limb pain and tossing and turning in the early hours of the night. Would like some oxycodone now.  CM note reviewed; plan for family education on Monday with friend.   ROS: Denies SOB, CP, abd pain; N/V/D.    Objective:   DG CHEST PORT 1 VIEW  Result Date: 11/15/2019 CLINICAL DATA:  Fever EXAM: PORTABLE CHEST 1 VIEW COMPARISON:  09/26/2019 FINDINGS: Cardiomegaly. Aortic atherosclerosis. Linear scarring in the right upper lobe, stable. No acute confluent opacities or effusions. No acute bony abnormality. IMPRESSION: Cardiomegaly.  No acute cardiopulmonary disease. Electronically Signed   By: Rolm Baptise M.D.   On: 11/15/2019 20:26   Recent Labs    11/15/19 2024  WBC 7.9  HGB 9.5*  HCT 30.8*  PLT 358   Recent Labs    11/15/19 0901 11/16/19 0556  NA 132* 137  K 4.7 4.7  CL 102 99  CO2 21* 23  GLUCOSE 70 105*  BUN 22* 25*  CREATININE 0.67 0.75  CALCIUM 8.9 9.5    Intake/Output Summary (Last 24 hours) at 11/17/2019 0845 Last data filed at 11/17/2019 0843 Gross per 24 hour  Intake 582 ml  Output 900 ml  Net -318 ml     Physical Exam: Vital Signs Blood pressure 101/61, pulse 87, temperature 97.9 F (36.6 C), resp. rate 17, SpO2 96 %. Constitutional: Vital signs reviewed.  Cachectic. Lying in bed. Appears in pain.  HENT: Normocephalic.  Atraumatic. Eyes: EOMI. No discharge. Cardiovascular: No JVD. Respiratory: Normal effort.  No stridor. GI: Non-distended. Skin: Bilateral vacs to stumps. Right upper extremity with 3 circular red lesions, stable, nontender, healing well.  Bilateral hand deformities Psych: Normal mood.  Normal behavior. Musc: No edema in extremities.  No tenderness in extremities. Neurological: Alert Motor: B/l UE: 4/5 proximal to distal,  unchanged B/l LE: HF 1/5 (pain inhibition), unchanged  Assessment/Plan: 1. 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  Physiatrist is providing close team supervision and 24 hour management of active medical problems listed below.  Physiatrist and rehab team continue to assess barriers to discharge/monitor patient progress toward functional and medical goals  Care Tool:  Bathing    Body parts bathed by patient: Right arm, Left arm, Abdomen, Chest, Front perineal area, Face, Right upper leg, Left upper leg   Body parts bathed by helper: Buttocks Body parts n/a: Right lower leg, Left lower leg(B AKA)   Bathing assist Assist Level: Moderate Assistance - Patient 50 - 74%(bed level)     Upper Body Dressing/Undressing Upper body dressing   What is the patient wearing?: Pull over shirt    Upper body assist Assist Level: Supervision/Verbal cueing    Lower Body Dressing/Undressing Lower body dressing    Lower body dressing activity did not occur: Safety/medical concerns(B wound vac, high AKA) What is the patient wearing?: Incontinence brief, Pants     Lower body assist Assist for lower body dressing: Minimal Assistance - Patient > 75%     Toileting Toileting Toileting Activity did not occur (Clothing management and hygiene only): N/A (no void or bm)  Toileting assist Assist for toileting: Minimal Assistance - Patient > 75%     Transfers Chair/bed transfer  Transfers assist  Chair/bed  transfer activity did not occur: Refused  Chair/bed transfer assist level: Supervision/Verbal cueing     Locomotion Ambulation   Ambulation assist   Ambulation activity did not occur: N/A          Walk 10 feet activity   Assist  Walk 10 feet activity did not occur: N/A        Walk 50 feet activity   Assist Walk 50 feet  with 2 turns activity did not occur: N/A         Walk 150 feet activity   Assist Walk 150 feet activity did not occur: N/A         Walk 10 feet on uneven surface  activity   Assist Walk 10 feet on uneven surfaces activity did not occur: N/A         Wheelchair     Assist Will patient use wheelchair at discharge?: Yes Type of Wheelchair: Manual Wheelchair activity did not occur: Refused  Wheelchair assist level: Supervision/Verbal cueing Max wheelchair distance: 147f    Wheelchair 50 feet with 2 turns activity    Assist    Wheelchair 50 feet with 2 turns activity did not occur: Refused   Assist Level: Supervision/Verbal cueing   Wheelchair 150 feet activity     Assist  Wheelchair 150 feet activity did not occur: Refused   Assist Level: Supervision/Verbal cueing   Blood pressure 101/61, pulse 87, temperature 97.9 F (36.6 C), resp. rate 17, SpO2 96 %.  Medical Problem List and Plan: 1.  Deficits with mobility, transfers, self-care secondary to bilateral AKA revision.            Continue CIR PT and OT  Doxycycline through 2/11 2.  Antithrombotics: -DVT/anticoagulation:  Pharmaceutical: Lovenox             -antiplatelet therapy: N/A 3. Chronic pain/Pain Management:   Norco to q4 hours prn, changed to oxycodone every 4 as needed-plan to wean prior to discharge due to history of substance abuse.  Will monitor once VACs DC'd.  Gabapentin 300 mg QHS for nerve pain, increased to 3 times daily on 2/9  2/10: Pain is better controlled with above changes.   2/12: In pain this morning. Just received oxycodone at 8:15am.              Monitor with increased mobility 4. Mood: LCSW to follow for evaluation and support.              -antipsychotic agents: N/A 5. Neuropsych: This patient is capable of making decisions on her own behalf. 6. Skin/Wound Care: Monitor wounds for healing   Rheumatology consult unavailable, will refer to Derm as outpatient  DC  VAC yesterday; tolerated well.   2/12: Residual limb healing well. 7. Fluids/Electrolytes/Nutrition: Monitor I/O.   8. HTN: Monitor BP   Continue Norvasc.   2/11: well controlled.   2/12: very well controlled. Has been in low 1147Wsystolic. Will decrease Amlodipine to 569mdaily.              Monitor with increased mobility. 9. Leucocytosis: Resolved  Continue to monitor for signs of infection.  10. Hypokalemia:   Potassium 4.7 on 2/8, labs ordered for tomorrow  2/11: K+ stable.   Supplement DC'd 11.  Transaminitis  LFTs elevated, but improving on 2/6, labs ordered for tomorrow 12. Tobacco use/Cocaine abuse: Continue to educate on importance of cessation.  13.  Hypoalbuminemia  Supplement initiated 14.  Hyponatremia  Sodium 134 on 2/8, labs ordered  for tomorrow  2/11: Na stable  Continue to monitor 15.  Anemia of chronic disease  Hemoglobin 10.3 on 2/8  Continue to monitor  LOS: 7 days A FACE TO FACE EVALUATION WAS PERFORMED  [name] 11/17/2019, 8:45 AM

## 2019-11-17 NOTE — Progress Notes (Signed)
Occupational Therapy Session Note  Patient Details  Name: [name] MRN: [id number] Date of Birth: 06-04-1961  Today's Date: 11/17/2019 OT Individual Time: 1100-1155 OT Individual Time Calculation (min): 55 min    Short Term Goals: Week 1:  OT Short Term Goal 1 (Week 1): STG= LTG d/t ELOS  Skilled Therapeutic Interventions/Progress Updates:    Pt greeted semi-reclined in bed and agreeable to OT treatment session. Pt declined to get OOB 2/2 pain in residual limbs. Pt reports itching and pain at incision sites. OT provided pt with 3 lb dowel rod. Pt in tall sitting in bed. Pt completed 4 sets of 10 bicep curl, chest press, and straight arm raises. Extended rest break in between sets. Pt returned to supine and OT educated on importance of stretching B hips by laying flat. Pt completed 3 sets of 10 glute squeezes and hip flex/ext. More UB there ex using orange theraband attached to bed. Triceps press, chest press, and overhead press 3 sets of 10. Pt left semi-reclined in bed at end of session with bed alarm on, call bell in reach, and needs met.   Therapy Documentation Precautions:  Precautions Precautions: Fall Precaution Comments: B wound vacs Restrictions Weight Bearing Restrictions: Yes RLE Weight Bearing: Non weight bearing LLE Weight Bearing: Non weight bearing Pain: Pain Assessment Pain Scale: 0-10 Pain Score: 8 Pain Type: Surgical pain;Acute pain Pain Location: (stumps) Pain Orientation: Right;Left Pain Descriptors / Indicators: Aching Pain Frequency: Constant Pain Onset: On-going Patients Stated Pain Goal: 0 Pain Intervention(s): Repositioned  Therapy/Group: Individual Therapy  Valma Cava 11/17/2019, 11:47 AM

## 2019-11-17 NOTE — Progress Notes (Signed)
Physical Therapy Weekly Progress Note  Patient Details  Name: [name] MRN: [id number] Date of Birth: 11-Oct-1960  Beginning of progress report period: November 11, 2019 End of progress report period: November 17, 2019  Today's Date: 11/17/2019 PT Individual Time: 3810-1751 and 1300-1400 PT Missed Time: 15 minutes due to pain/fatigue PT Individual Time Calculation (min): 56 min and 60 min  Patient has met 3 of 6 long term goals. Pt demonstrates improvements with participation in therapy. Pt has also improved with bed mobility, functional mobility, UE strength, and transfers. Pt currently able to perform bed mobility with supervision, AP/PA transfers with CGA/supervision, AP/PA simulated car transfers with CGA, and WC mobility 117f with supervision. Pt continues to demonstrate difficulty with bilateral UE strength, endurance, and is limited by pain and fluctuating motivation levels.   Patient continues to demonstrate the following deficits muscle weakness and decreased postural control and decreased balance strategies and therefore will continue to benefit from skilled PT intervention to increase functional independence with mobility.  Patient progressing toward long term goals..  Continue plan of care.  PT Short Term Goals Week 1:  PT Short Term Goal 1 (Week 1): STG=LTG due to ELOS PT Short Term Goal 1 - Progress (Week 1): Progressing toward goal Week 2:  PT Short Term Goal 1 (Week 2): STG=LTG due to LOS  Skilled Therapeutic Interventions/Progress Updates:  Balance/vestibular training;Cognitive remediation/compensation;Community reintegration;Discharge planning;Disease management/prevention;DME/adaptive equipment instruction;Neuromuscular re-education;Functional mobility training;Pain management;Patient/family education;Skin care/wound management;Psychosocial support;Splinting/orthotics;Stair training;Therapeutic Activities;Therapeutic Exercise;UE/LE Strength taining/ROM;UE/LE Coordination  activities;Visual/perceptual remediation/compensation;Wheelchair propulsion/positioning   Today's Interventions: Treatment Session 1: [id number] min Received pt supine in bed, pt initially not agreeable to therapy, but agreed after convincing, and reported pain in bilateral LE's along staple line but did not state pain number. Pt reported she recently received pain medication prior to therapy. Session focused on functional mobility/transfers, UE/LE strength, dynamic sitting balance, amputee education, and improved activity tolerance. Pt was educated on importance of OOB mobility and benefits of participating in therapy. Therapist suggested providing and HEP for pt, however pt uninterested this morning (will attempt to provide HEP again later on). Pt donned shorts in bed with supervision. Pt transferred supine<>sit with supervision. Pt performed PA transfer bed<>WC with supervision. Pt performed WC mobility 109fwith bilateral UEs supervision. Pt reported increased arm fatigue and requested to be pushed remainder of way to gym. Pt transferred AP WC<>mat with supervision. Pt threw horseshoes sitting on mat with supervision x 4 trials. Pt performed PA transfer mat<>WC. Pt performed WC mobility 2533fith bilateral UEs and supervision over to activity table in dayroom. Pt worked on dynamic sitting balance, reaching outside BOS, and fine motor control to make valentines card sitting in wheelchair with supervision. Therapist provided pt with activities to do in room consisting of word searches, coloring pages, mazes, puzzles, and sodoku. Pt transported back to room total assist. Pt performed AP transfer WC<>bed with supervision and sit<>supine with supervision. Concluded session with pt supine in bed, needs within reach, and bed alarm on. Therapist provided snacks/drink to pt.   Treatment Session 2: 1300-1400 60 min Received pt sitting in bed coloring, pt initially not agreeable to therapy, but agreed after  convincing. Session focused on functional mobility/transfers, UE/UE strength, dynamic sitting balance, amputee education, HEP education, and improved activity tolerance. Pt reported urge to use restroom and performed PA transfer bed<>WC with supervision. Pt propelled WC into bathroom with supervision and performed AP transfer WC<>commode CGA and turned around to void. Pt continent of bowel and  bladder. Pt able to perform hygiene with supervision. Pt performed PA transfer commode<>WC with CGA and required min A to don clean incontinence brief and pants while sitting in WC. Pt washed hands at sink seated in [facility] with supervision. Pt performed WC mobility 235f using bilateral UEs and supervision. Pt performed AP transfer WC<>mat supervision. Played seated connect 4 x 2 trials with supervision with emphasis on dynamic sitting balance, postural control, and reaching outside BOS. Pt played seated cornhole x 1 game to 21 with supervision.   Therapist provided HEP to pt consisting of the following exercises: -Wheelchair Push-Up (AKA) - 10 reps - 3 sets - 1x daily - 7x weekly -Sidelying Hip Abduction (AKA) - 10 reps - 3 sets - 1x daily - 7x weekly -Prone Hip Extension with Residual Limb (BKA) - 10 reps - 3 sets - 1x daily - 7x weekly  Reviewed HEP with pt and educated on importance of mobility and ROM, particularly to avoid hip flexion contractures if pt plans to get prosthesis; pt verbalized understanding and stated she would do these in bed. Pt reported increased fatigue and pain along staple line and requested to return to room. [facility] transfer WC<>bed with supervision. Concluded session with pt supine in bed, needs within reach, and bed alarm on. Therapist provided snacks for pt. 15 minutes missed of skilled physical therapy due to pain/fatigue.   Therapy Documentation Precautions:  Precautions Precautions: Fall Precaution Comments: B wound vacs Restrictions Weight  Bearing Restrictions: Yes RLE Weight Bearing: Non weight bearing LLE Weight Bearing: Non weight bearing  Therapy/Group: Individual Therapy AAlfonse AlpersPT, DPT   11/17/2019, 7:38 AM

## 2019-11-17 NOTE — Plan of Care (Signed)

## 2019-11-18 ENCOUNTER — Inpatient Hospital Stay (HOSPITAL_COMMUNITY): Payer: Medicaid Other | Admitting: Occupational Therapy

## 2019-11-18 ENCOUNTER — Inpatient Hospital Stay (HOSPITAL_COMMUNITY): Payer: Medicaid Other

## 2019-11-18 NOTE — Progress Notes (Signed)
Occupational Therapy Weekly Progress Note  Patient Details  Name: [name] MRN: [id number] Date of Birth: 1961/03/04  Beginning of progress report period: November 11, 2019 End of progress report period: November 18, 2019  Today's Date: 11/18/2019 OT Individual Time: 1015-1100 OT Individual Time Calculation (min): 45 min     Short term goals not set due to estimated length of stay.  Pt initially was making minimal progress towards goals with frequent refusals, however since the removal of her wound VACs she has demonstrated increased activity tolerance. Pt is able to complete LB bathing and dressing at bed level with setup/supervision.  Pt has demonstrated ability to complete bed <> w/c and toilet transfers with CGA to Min assist.    Patient continues to demonstrate the following deficits: muscle weakness and decreased sitting balance, decreased postural control and decreased balance strategies and therefore will continue to benefit from skilled OT intervention to enhance overall performance with BADL and Reduce care partner burden.  See Patient's Care Plan for progression toward long term goals.  Patient progressing toward long term goals..  Continue plan of care.  Skilled Therapeutic Interventions/Progress Updates:    Treatment session with focus on self-care retraining and BUE strengthening and trunk control.  Pt received semi-reclined in bed agreeable to therapy session.  Pt engaged in bathing and dressing at bed level with pt completing all tasks with setup/supervision. Pt demonstrating overall increased mood and motivation to progress as she wants to return to her PLOF and begin traveling.  Engaged in [place] with 1 kg medicine ball with 2 sets of 10 chest presses and overhead presses in unsupported sitting while also challenging trunk control.  Pt remained semi-reclined in bed pleased with progress in session.  Therapy Documentation Precautions:  Precautions Precautions:  Fall Precaution Comments: B wound vacs Restrictions Weight Bearing Restrictions: Yes RLE Weight Bearing: Non weight bearing LLE Weight Bearing: Non weight bearing General:   Vital Signs: Therapy Vitals Temp: (!) 97.5 F (36.4 C) Pulse Rate: 85 Resp: 16 BP: (!) 100/55 Patient Position (if appropriate): Lying Oxygen Therapy SpO2: 97 % O2 Device: Room Air Pain: No c/o pain  Therapy/Group: Individual Therapy  Simonne Come 11/18/2019, 3:17 PM

## 2019-11-18 NOTE — Progress Notes (Signed)
[place] PHYSICAL MEDICINE & REHABILITATION PROGRESS NOTE   Subjective/Complaints:  Slept ok, hungry this am, no pain c/os  ROS: Denies SOB, CP, abd pain; N/V/D.    Objective:   No results found. Recent Labs    11/15/19 2024  WBC 7.9  HGB 9.5*  HCT 30.8*  PLT 358   Recent Labs    11/15/19 0901 11/16/19 0556  NA 132* 137  K 4.7 4.7  CL 102 99  CO2 21* 23  GLUCOSE 70 105*  BUN 22* 25*  CREATININE 0.67 0.75  CALCIUM 8.9 9.5    Intake/Output Summary (Last 24 hours) at 11/18/2019 0630 Last data filed at 11/17/2019 2215 Gross per 24 hour  Intake 942 ml  Output 125 ml  Net 817 ml     Physical Exam: Vital Signs Blood pressure (!) 101/55, pulse 84, temperature 97.6 F (36.4 C), resp. rate 18, SpO2 98 %. Constitutional: Vital signs reviewed.  Cachectic. Lying in bed. Appears in pain.  HENT: Normocephalic.  Atraumatic. Eyes: EOMI. No discharge. Cardiovascular: No JVD. Respiratory: Normal effort.  No stridor. GI: Non-distended. Skin: Bilateral vacs to stumps. Right upper extremity with 3 circular red lesions, stable, nontender, healing well.  Bilateral hand deformities Psych: Normal mood.  Normal behavior. Musc: No edema in extremities.  No tenderness in extremities. Neurological: Alert Motor: B/l UE: 4/5 proximal to distal, unchanged B/l LE: HF 1/5 (pain inhibition), unchanged  Assessment/Plan: 1. 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  Physiatrist is providing close team supervision and 24 hour management of active medical problems listed below.  Physiatrist and rehab team continue to assess barriers to discharge/monitor patient progress toward functional and medical goals  Care Tool:  Bathing    Body parts bathed by patient: Right arm, Left arm, Abdomen, Chest, Front perineal area, Face, Right upper  leg, Left upper leg   Body parts bathed by helper: Buttocks Body parts n/a: Right lower leg, Left lower leg(B AKA)   Bathing assist Assist Level: Moderate Assistance - Patient 50 - 74%(bed level)     Upper Body Dressing/Undressing Upper body dressing   What is the patient wearing?: Pull over shirt    Upper body assist Assist Level: Supervision/Verbal cueing    Lower Body Dressing/Undressing Lower body dressing    Lower body dressing activity did not occur: Safety/medical concerns(B wound vac, high AKA) What is the patient wearing?: Pants     Lower body assist Assist for lower body dressing: Supervision/Verbal cueing     Toileting Toileting Toileting Activity did not occur (Clothing management and hygiene only): N/A (no void or bm)  Toileting assist Assist for toileting: Minimal Assistance - Patient > 75%     Transfers Chair/bed transfer  Transfers assist  Chair/bed transfer activity did not occur: Refused  Chair/bed transfer assist level: Supervision/Verbal cueing     Locomotion Ambulation   Ambulation assist   Ambulation activity did not occur: N/A          Walk 10 feet activity   Assist  Walk 10 feet activity did not occur: N/A        Walk 50 feet activity   Assist Walk 50 feet with 2 turns activity did not occur: N/A         Walk 150 feet activity   Assist Walk 150 feet activity did not occur: N/A  Walk 10 feet on uneven surface  activity   Assist Walk 10 feet on uneven surfaces activity did not occur: N/A         Wheelchair     Assist Will patient use wheelchair at discharge?: Yes Type of Wheelchair: Manual Wheelchair activity did not occur: Refused  Wheelchair assist level: Supervision/Verbal cueing Max wheelchair distance: 156f    Wheelchair 50 feet with 2 turns activity    Assist    Wheelchair 50 feet with 2 turns activity did not occur: Refused   Assist Level: Supervision/Verbal cueing    Wheelchair 150 feet activity     Assist  Wheelchair 150 feet activity did not occur: Refused   Assist Level: Supervision/Verbal cueing   Blood pressure (!) 101/55, pulse 84, temperature 97.6 F (36.4 C), resp. rate 18, SpO2 98 %.  Medical Problem List and Plan: 1.  Deficits with mobility, transfers, self-care secondary to bilateral AKA revision.            Continue CIR PT and OT  2.  Antithrombotics: -DVT/anticoagulation:  Pharmaceutical: Lovenox             -antiplatelet therapy: N/A 3. Chronic pain/Pain Management:   Norco to q4 hours prn, changed to oxycodone every 4 as needed-plan to wean prior to discharge due to history of substance abuse.  Will monitor once VACs DC'd.  Gabapentin 300 mg QHS for nerve pain, increased to 3 times daily on 2/9 .                Monitor with increased mobility 4. Mood: LCSW to follow for evaluation and support.              -antipsychotic agents: N/A 5. Neuropsych: This patient is capable of making decisions on her own behalf. 6. Skin/Wound Care: Monitor wounds for healing   Rheumatology consult unavailable, will refer to Derm as outpatient  DC VAC yesterday; tolerated well.   2/12: Residual limb healing well. 7. Fluids/Electrolytes/Nutrition: Monitor I/O.  Increasing po fluid  8. HTN: Monitor BP   Continue Norvasc.   2/13: well controlled.   2/12: very well controlled. Has been in low 1606Ysystolic. Will decrease Amlodipine to 55mdaily.     Vitals:   11/17/19 1950 11/18/19 0533  BP: (!) 103/55 (!) 101/55  Pulse: 86 84  Resp: 16 18  Temp: 97.9 F (36.6 C) 97.6 F (36.4 C)  SpO2: 100% 98%   9. Leucocytosis: Resolved  Continue to monitor for signs of infection.  10. Hypokalemia:   Potassium 4.7 on 2/8,and 2/11  Resolved off supplement 11.  Transaminitis  Resolved 12. Tobacco use/Cocaine abuse: Continue to educate on importance of cessation.  13.  Hypoalbuminemia  Supplement initiated 14.  Hyponatremia  Resolved    Continue to monitor 15.  Anemia of chronic disease  Hemoglobin 10.3 on 2/8, down to 9.4 on 2/11 ? Hemodilution with improved intake   Continue to monitor  LOS: 8 days A FACE TO FACE EVALUATION WAS PERFORMED  AnCharlett Blake/13/2021, 6:30 AM

## 2019-11-18 NOTE — Progress Notes (Signed)
Physical Therapy Session Note  Patient Details  Name: [name] MRN: [id number] Date of Birth: 11/18/1960  Today's Date: 11/18/2019 PT Individual Time: 1300-1400 PT Individual Time Calculation (min): 60 min   Short Term Goals: Week 1:  PT Short Term Goal 1 (Week 1): STG=LTG due to ELOS PT Short Term Goal 1 - Progress (Week 1): Progressing toward goal Week 2:  PT Short Term Goal 1 (Week 2): STG=LTG due to LOS Week 3:     Skilled Therapeutic Interventions/Progress Updates:    PAIN  7/10 bilat residual limbs at end of session, pt called nursing for meds.  Pt initially supine and agreeable to treatment session with focus on core stength, sitting balance, and endurance.   Supine to sit on bed w/supervision, transfers via A/boosting w/supervision.  wc propulsion greater than 111f mod I on unit.   Propels up/down ramp w/supervision. wc to mat w/supervision.   Sitting activities on mat for balance, core strength, and endurance including: Ball toss into rebounder x 359m, 55m35m 1 min w/cga, pt demonstrated good awareness of limites of stability w/task Boosting w/blocks 3x10 w/cga for balance Seated overhead press w/3lb bar 3 x10 w/cga for balance Seated forward press w/3lb bar 3x10 w/cga for balance  Pt requires several min rest between efforts due to fatigue but overall tolerated session well and was cooperative w/activites.  [facility] session [facility], became tearful and emotional support provided   Pt propelled wc [id number] I for endurance training.  Returned to room and performed wc to bed forward approach w/supervision.  Sit to supine independently.  Encouraged pt to perform supine glute sets w/hold at end range to decrease hip flexor tightness.  Pt stated she would after resting, but then declined stating "I don't feel like it now, I will do it later." Pt left supine w/rails up x 3, alarm set, bed in lowest position, and needs  in reach.   Therapy Documentation Precautions:  Precautions Precautions: Fall Precaution Comments: B wound vacs Restrictions Weight Bearing Restrictions: Yes RLE Weight Bearing: Non weight bearing LLE Weight Bearing: Non weight bearing    Therapy/Group: Individual Therapy  BarbCallie Fielding  [place]3/2021, 3:54 PM

## 2019-11-19 ENCOUNTER — Inpatient Hospital Stay (HOSPITAL_COMMUNITY): Payer: Medicaid Other

## 2019-11-19 MED ORDER — OXYCODONE HCL 5 MG PO TABS
10.0000 mg | ORAL_TABLET | Freq: Three times a day (TID) | ORAL | Status: DC | PRN
  Administered 2019-11-19 – 2019-11-20 (×2): 10 mg via ORAL
  Filled 2019-11-19 (×3): qty 2

## 2019-11-19 NOTE — Progress Notes (Signed)
[place] PHYSICAL MEDICINE & REHABILITATION PROGRESS NOTE   Subjective/Complaints:  Pt asking about staple removal.  DIscussed pain meds, pt is well controlled.  Discussed weaning   ROS: Denies SOB, CP, abd pain; N/V/D.    Objective:   No results found. No results for input(s): WBC, HGB, HCT, PLT in the last 72 hours. No results for input(s): NA, K, CL, CO2, GLUCOSE, BUN, CREATININE, CALCIUM in the last 72 hours.  Intake/Output Summary (Last 24 hours) at 11/19/2019 0604 Last data filed at 11/19/2019 0547 Gross per 24 hour  Intake 1380 ml  Output 800 ml  Net 580 ml     Physical Exam: Vital Signs Blood pressure (!) 113/59, pulse 79, temperature 98 F (36.7 C), temperature source Oral, resp. rate 18, SpO2 92 %. Constitutional: Vital signs reviewed.  Cachectic. Lying in bed. Appears in pain.  HENT: Normocephalic.  Atraumatic. Eyes: EOMI. No discharge. Cardiovascular: No JVD. Respiratory: Normal effort.  No stridor. GI: Non-distended. Skin: Bilateral vacs to stumps. Right upper extremity with 3 circular red lesions, stable, nontender, healing well.  Bilateral hand deformities Psych: Normal mood.  Normal behavior. Musc: No edema in extremities.  No tenderness in extremities. Neurological: Alert Motor: B/l UE: 4/5 proximal to distal, unchanged B/l LE: HF 1/5 (pain inhibition), unchanged  Assessment/Plan: 1. 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  Physiatrist is providing close team supervision and 24 hour management of active medical problems listed below.  Physiatrist and rehab team continue to assess barriers to discharge/monitor patient progress toward functional and medical goals  Care Tool:  Bathing    Body parts bathed by patient: Right arm, Left arm, Abdomen, Chest, Front perineal area, Face, Right upper leg,  Left upper leg, Buttocks   Body parts bathed by helper: Buttocks Body parts n/a: Right lower leg, Left lower leg(B AKA)   Bathing assist Assist Level: Set up assist     Upper Body Dressing/Undressing Upper body dressing   What is the patient wearing?: Pull over shirt    Upper body assist Assist Level: Set up assist    Lower Body Dressing/Undressing Lower body dressing    Lower body dressing activity did not occur: Safety/medical concerns(B wound vac, high AKA) What is the patient wearing?: Pants     Lower body assist Assist for lower body dressing: Supervision/Verbal cueing     Toileting Toileting Toileting Activity did not occur (Clothing management and hygiene only): N/A (no void or bm)  Toileting assist Assist for toileting: Minimal Assistance - Patient > 75%     Transfers Chair/bed transfer  Transfers assist  Chair/bed transfer activity did not occur: Refused  Chair/bed transfer assist level: Supervision/Verbal cueing     Locomotion Ambulation   Ambulation assist   Ambulation activity did not occur: N/A          Walk 10 feet activity   Assist  Walk 10 feet activity did not occur: N/A        Walk 50 feet activity   Assist Walk 50 feet with 2 turns activity did not occur: N/A         Walk 150 feet activity   Assist Walk 150 feet activity did not occur: N/A         Walk 10 feet on uneven surface  activity   Assist Walk 10 feet on uneven surfaces activity did not occur:  N/A         Wheelchair     Assist Will patient use wheelchair at discharge?: Yes Type of Wheelchair: Manual Wheelchair activity did not occur: Refused  Wheelchair assist level: Independent Max wheelchair distance: 300    Wheelchair 50 feet with 2 turns activity    Assist    Wheelchair 50 feet with 2 turns activity did not occur: Refused   Assist Level: Independent   Wheelchair 150 feet activity     Assist  Wheelchair 150 feet activity  did not occur: Refused   Assist Level: Independent   Blood pressure (!) 113/59, pulse 79, temperature 98 F (36.7 C), temperature source Oral, resp. rate 18, SpO2 92 %.  Medical Problem List and Plan: 1.  Deficits with mobility, transfers, self-care secondary to bilateral AKA revision.            Continue CIR PT and OT  2.  Antithrombotics: -DVT/anticoagulation:  Pharmaceutical: Lovenox             -antiplatelet therapy: N/A 3. Chronic pain/Pain Management:   Norco to q4 hours prn, changed to oxycodone every 4 as needed-plan to wean prior to discharge due to history of substance abuse.  Reduce oxy IR to 36m q8h prn  Gabapentin 300 mg QHS for nerve pain, increased to 3 times daily on 2/9 .                Monitor with increased mobility 4. Mood: LCSW to follow for evaluation and support.              -antipsychotic agents: N/A 5. Neuropsych: This patient is capable of making decisions on her own behalf. 6. Skin/Wound Care: Monitor wounds for healing   Rheumatology consult unavailable, will refer to Derm as outpatient  DC VAC yesterday; tolerated well.   2/12: Residual limb healing well. 7. Fluids/Electrolytes/Nutrition: Monitor I/O.  Increasing po fluid  8. HTN: Monitor BP   Continue Norvasc.   2/13: well controlled.   2/12: very well controlled. Has been in low 1161Wsystolic. Will decrease Amlodipine to 518mdaily.     Vitals:   11/18/19 1939 11/19/19 0600  BP: (!) 103/59 (!) 113/59  Pulse: 86 79  Resp: 18 18  Temp: 98.4 F (36.9 C) 98 F (36.7 C)  SpO2: 95% 92%  controlled 2/14 9. Leucocytosis: Resolved  Continue to monitor for signs of infection.  10. Hypokalemia:   Potassium 4.7 on 2/8,and 2/11  Resolved off supplement 11.  Transaminitis  Resolved 12. Tobacco use/Cocaine abuse: Continue to educate on importance of cessation.  13.  Hypoalbuminemia  Supplement initiated 14.  Hyponatremia  Resolved   Continue to monitor 15.  Anemia of chronic  disease  Hemoglobin 10.3 on 2/8, down to 9.4 on 2/11 ? Hemodilution with improved intake   Continue to monitor  LOS: 9 days A FACE TO FACE EVALUATION WAS PERFORMED  AnCharlett Blake/14/2021, 6:04 AM

## 2019-11-19 NOTE — Progress Notes (Signed)
Physical Therapy Session Note  Patient Details  Name: [name] MRN: [id number] Date of Birth: Feb 11, 1961  Today's Date: 11/19/2019 PT Individual Time: 3704-8889 PT Individual Time Calculation (min): 45 min   Short Term Goals: Week 1:  PT Short Term Goal 1 (Week 1): STG=LTG due to ELOS PT Short Term Goal 1 - Progress (Week 1): Progressing toward goal Week 2:  PT Short Term Goal 1 (Week 2): STG=LTG due to LOS  Skilled Therapeutic Interventions/Progress Updates:     Patient in bed with her friend at bedside upon PT arrival. Patient alert and agreeable to PT session. Patient reported 6/10 R>L residual limb pain during session, RN made aware. PT provided repositioning, rest breaks, and distraction as pain interventions throughout session. Asked several times during session about when her staples would be coming out. She was also tearful intermittently during session due to missing her family and spoke about taking a trip to [place] to visit them as soon as she was able. Discussed safety of long travel on public transit, waiting for limb healing, and calling ahead to make appropriate accommodations.   Therapeutic Activity: Bed Mobility: Patient performed supine to/from sit independently in a flat hospital bed without use of bed rails and on a mat table.  Transfers: Patient performed A/P transfers bed<>w/c, w/c<>mat table, and w/c<>ADL couch, to simulate home transfer since she sleeps on a couch, with supervision with PT blocking w/c for safety. She performed a slide board transfer w/c<>mat table x2 with total a for board placement and close supervision for transfer. Provided cues for hand placement, board placement, and head-hips relationship for proper technique and decreased assist with transfers.   Wheelchair Mobility:  Patient propelled wheelchair from her room to the ADL apartment, then to the gym, then back to her room with mod I for UE endurance and strengthening. Patient was independent  with w/c set up for A/P transfers, required min A to set up for slide board transfers, and was independent with use of breaks throughout session.  She went up/down a ramp x2 with close supervision for safety and cues for leaning forward for improved balance/safety while ascending the ramp.   Therapeutic Exercise: Patient performed the following exercises with verbal and tactile cues for proper technique. -B hip extension in supine with gentle manual over pressure 2x30 sec -B hip abduction and adduction 2x10 in supine Educated on importance of hip flexion if she is to have prosthesis in the future, patient stated understanding and agreeable to lying flat in supine x15 min per day working on bringing LEs to the bed and progressing to prone when tolerated.   Patient in bed with her friend in the room at end of session with breaks locked, bed alarm set, and all needs within reach.    Therapy Documentation Precautions:  Precautions Precautions: Fall Precaution Comments: B wound vacs Restrictions Weight Bearing Restrictions: Yes RLE Weight Bearing: Non weight bearing LLE Weight Bearing: Non weight bearing    Therapy/Group: Individual Therapy  Kristof Nadeem L Jack Bolio PT, DPT  11/19/2019, 4:30 PM

## 2019-11-20 ENCOUNTER — Ambulatory Visit (HOSPITAL_COMMUNITY): Payer: Medicaid Other

## 2019-11-20 ENCOUNTER — Encounter (HOSPITAL_COMMUNITY): Payer: Medicaid Other | Admitting: Occupational Therapy

## 2019-11-20 ENCOUNTER — Telehealth: Payer: Self-pay | Admitting: Family Medicine

## 2019-11-20 ENCOUNTER — Inpatient Hospital Stay (HOSPITAL_COMMUNITY): Payer: Medicaid Other | Admitting: Occupational Therapy

## 2019-11-20 LAB — CBC
HCT: 30.6 % — ABNORMAL LOW (ref 36.0–46.0)
Hemoglobin: 9.2 g/dL — ABNORMAL LOW (ref 12.0–15.0)
MCH: 25.5 pg — ABNORMAL LOW (ref 26.0–34.0)
MCHC: 30.1 g/dL (ref 30.0–36.0)
MCV: 84.8 fL (ref 80.0–100.0)
Platelets: 354 10*3/uL (ref 150–400)
RBC: 3.61 MIL/uL — ABNORMAL LOW (ref 3.87–5.11)
RDW: 19.9 % — ABNORMAL HIGH (ref 11.5–15.5)
WBC: 7.2 10*3/uL (ref 4.0–10.5)
nRBC: 0 % (ref 0.0–0.2)

## 2019-11-20 LAB — BASIC METABOLIC PANEL
Anion gap: 9 (ref 5–15)
BUN: 15 mg/dL (ref 6–20)
CO2: 22 mmol/L (ref 22–32)
Calcium: 9 mg/dL (ref 8.9–10.3)
Chloride: 105 mmol/L (ref 98–111)
Creatinine, Ser: 0.51 mg/dL (ref 0.44–1.00)
GFR calc Af Amer: >60 mL/min (ref >60)
GFR calc non Af Amer: >60 mL/min (ref >60)
Glucose, Bld: 90 mg/dL (ref 70–99)
Potassium: 4.7 mmol/L (ref 3.5–5.1)
Sodium: 136 mmol/L (ref 135–145)

## 2019-11-20 LAB — CULTURE, BLOOD (ROUTINE X 2)
Culture: NO GROWTH
Culture: NO GROWTH
Special Requests: ADEQUATE
Special Requests: ADEQUATE

## 2019-11-20 MED ORDER — OXYCODONE HCL 5 MG PO TABS
5.0000 mg | ORAL_TABLET | Freq: Three times a day (TID) | ORAL | Status: DC | PRN
  Administered 2019-11-20: 5 mg via ORAL

## 2019-11-20 NOTE — Telephone Encounter (Signed)
Maggie from [facility] called needing verbal permission to treat on 2/17 because she can not get in contact with the patient. However, the referral was made by the hospital. Her call back number is [phone]. Her extension is [phone].

## 2019-11-20 NOTE — Progress Notes (Signed)
[facility] PHYSICAL MEDICINE & REHABILITATION PROGRESS NOTE   Subjective/Complaints:   Pt reports having " a little bit" of pain this AM.   Also hungry- waiting for breakfast.    ROS: Denies SOB, CP, abd pain; N/V/D.    Objective:   No results found. Recent Labs    11/20/19 0605  WBC 7.2  HGB 9.2*  HCT 30.6*  PLT 354   Recent Labs    11/20/19 0605  NA 136  K 4.7  CL 105  CO2 22  GLUCOSE 90  BUN 15  CREATININE 0.51  CALCIUM 9.0    Intake/Output Summary (Last 24 hours) at 11/20/2019 1027 Last data filed at 11/20/2019 1025 Gross per 24 hour  Intake 1100 ml  Output 1125 ml  Net -25 ml     Physical Exam: Labs and vitals reviewed   Vital Signs Blood pressure 120/81, pulse 85, temperature 98.6 F (37 C), resp. rate 18, SpO2 99 %. Constitutional: Vital signs reviewed.  Cachectic. Lying in bed. Appears in moderate pain,  HENT: Normocephalic.  Atraumatic. Lips dry; keeps closing her eyes,  Eyes: EOMI. No discharge. Cardiovascular: RRR Respiratory: CTA B/L, no W/R/R GI: Non-distended. Soft, NT, (+)BS Skin: B/L AKAs-  Right upper extremity with 3 circular red lesions, stable, nontender, healing well.  Bilateral hand deformities Psych: flat affect Musc: No edema in extremities.  No tenderness in extremities. Neurological: Alert Motor: B/l UE: 4/5 proximal to distal, unchanged B/l LE: HF 1/5 (pain inhibition), unchanged  Assessment/Plan: 1. 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  Physiatrist is providing close team supervision and 24 hour management of active medical problems listed below.  Physiatrist and rehab team continue to assess barriers to discharge/monitor patient progress toward functional and medical goals  Care Tool:  Bathing    Body parts bathed by patient: Right arm, Left arm, Abdomen,  Chest, Front perineal area, Face, Right upper leg, Left upper leg, Buttocks   Body parts bathed by helper: Buttocks Body parts n/a: Right lower leg, Left lower leg(B AKA)   Bathing assist Assist Level: Set up assist     Upper Body Dressing/Undressing Upper body dressing   What is the patient wearing?: Pull over shirt    Upper body assist Assist Level: Set up assist    Lower Body Dressing/Undressing Lower body dressing    Lower body dressing activity did not occur: Safety/medical concerns(B wound vac, high AKA) What is the patient wearing?: Pants     Lower body assist Assist for lower body dressing: Supervision/Verbal cueing     Toileting Toileting Toileting Activity did not occur (Clothing management and hygiene only): N/A (no void or bm)  Toileting assist Assist for toileting: Minimal Assistance - Patient > 75%     Transfers Chair/bed transfer  Transfers assist  Chair/bed transfer activity did not occur: Refused  Chair/bed transfer assist level: Supervision/Verbal cueing     Locomotion Ambulation   Ambulation assist   Ambulation activity did not occur: N/A          Walk 10 feet activity   Assist  Walk 10 feet activity did not occur: N/A        Walk 50 feet activity   Assist Walk 50 feet with 2 turns activity did not occur: N/A         Walk 150 feet activity   Assist Walk 150  feet activity did not occur: N/A         Walk 10 feet on uneven surface  activity   Assist Walk 10 feet on uneven surfaces activity did not occur: N/A         Wheelchair     Assist Will patient use wheelchair at discharge?: Yes Type of Wheelchair: Manual Wheelchair activity did not occur: Refused  Wheelchair assist level: Independent Max wheelchair distance: 300    Wheelchair 50 feet with 2 turns activity    Assist    Wheelchair 50 feet with 2 turns activity did not occur: Refused   Assist Level: Independent   Wheelchair 150 feet  activity     Assist  Wheelchair 150 feet activity did not occur: Refused   Assist Level: Independent   Blood pressure 120/81, pulse 85, temperature 98.6 F (37 C), resp. rate 18, SpO2 99 %.  Medical Problem List and Plan: 1.  Deficits with mobility, transfers, self-care secondary to bilateral AKA revision.            Continue CIR PT and OT  2/15- doing family training today.  2.  Antithrombotics: -DVT/anticoagulation:  Pharmaceutical: Lovenox             -antiplatelet therapy: N/A 3. Chronic pain/Pain Management:   Norco to q4 hours prn, changed to oxycodone every 4 as needed-plan to wean prior to discharge due to history of substance abuse.  Reduce oxy IR to 71m q8h prn  2/15- suggest back to Norco by Dr PPosey ProntoTuesday, but since just made change yesterday, will wait today.   Gabapentin 300 mg QHS for nerve pain, increased to 3 times daily on 2/9 .                Monitor with increased mobility 4. Mood: LCSW to follow for evaluation and support.              -antipsychotic agents: N/A 5. Neuropsych: This patient is capable of making decisions on her own behalf. 6. Skin/Wound Care: Monitor wounds for healing   Rheumatology consult unavailable, will refer to Derm as outpatient  DC VAC yesterday; tolerated well.   2/12: Residual limb healing well. 7. Fluids/Electrolytes/Nutrition: Monitor I/O.  Increasing po fluid  8. HTN: Monitor BP   Continue Norvasc.   2/13: well controlled.   2/12: very well controlled. Has been in low 1253Gsystolic. Will decrease Amlodipine to 580mdaily.   2/15- improved Bps 130/s70s    Vitals:   11/19/19 2002 11/20/19 0643  BP: 137/74 120/81  Pulse: 93 85  Resp: 18 18  Temp: 98.2 F (36.8 C) 98.6 F (37 C)  SpO2: 97% 99%  controlled 2/15- con't regimen 9. Leucocytosis: Resolved  Continue to monitor for signs of infection.  10. Hypokalemia:   Potassium 4.7 on 2/8,and 2/11  Resolved off supplement  2/15- K+ 4.7 11.   Transaminitis  Resolved 12. Tobacco use/Cocaine abuse: Continue to educate on importance of cessation.  13.  Hypoalbuminemia  Supplement initiated 14.  Hyponatremia  Resolved   2/15- Na 136  Continue to monitor 15.  Anemia of chronic disease  Hemoglobin 10.3 on 2/8, down to 9.4 on 2/11 ? Hemodilution with improved intake   Continue to monitor  2/15- Hb down to 9.2, but overall stable-   LOS: 10 days A FACE TO FACE EVALUATION WAS PERFORMED  Gurjit Loconte 11/20/2019, 10:27 AM

## 2019-11-20 NOTE — Care Management (Signed)
   The overall goal for the admission was met for:   Discharge location: Home to boarding house with brother  Length of Stay: 11 days with discharge 11/21/19  Discharge activity level: Supervision overall  Home/community participation: Limited community participation wheelchair level  Services provided included: MD, RD, PT, OT, RN, CM, Pharmacy, Neuropsych and SW  Financial Services: Medicaid  Follow-up services arranged: Home Health: PT, OT with [facility], DME: No DME ordered; see note of previous DME ordered 2018 and Patient/Family has no preference for HH/DME agencies  Comments (or additional information):[facility] [phone]  Patient/Family verbalized understanding of follow-up arrangements: Yes   Family education scheduled for 11/20/19 @ 1300; neither friend nor brother showed up for education. Patient noted she was not surprised; happened  The last time she was hospitalized and she feels comfortable instructing people in the home how to best assist her at discharge.   Individual responsible for coordination of the follow-up plan: Patient; Maryjane Benedict: [phone]/ Brother: Ailene Ards [phone]  Transportation arranged with [facility] for transport to home at discharge.  Margarito Liner

## 2019-11-20 NOTE — Telephone Encounter (Signed)
Approval given to [facility] for PT & OT stump care.

## 2019-11-20 NOTE — Progress Notes (Signed)
Physical Therapy Discharge Summary  Patient Details  Name: [name] MRN: [id number] Date of Birth: 08/03/1961  Today's Date: 11/20/2019 PT Individual Time: 1300-1340 PT Individual Time Calculation (min): 40 min    Patient has met 6 of 6 long term goals due to improved activity tolerance, improved balance, improved postural control, increased strength, decreased pain, improved awareness and improved coordination. Patient to discharge at a wheelchair level Supervision.   Patient's family did not attend family education training. Pt verbalized and demonstrated confidence with all tasks for discharge home. Pt reports that her brother will be there 24/7 when she gets home and verbalized that she felt confident with all tasks for discharge home despite her family not showing up for family education.   All goals met  Recommendation:  Patient will benefit from ongoing skilled PT services in home health setting to continue to advance safe functional mobility, address ongoing impairments in transfers, UE/LE strength, postural control/core stability, endurance, and to minimize fall risk.  Equipment: slideboard  Reasons for discharge: treatment goals met  Patient/family agrees with progress made and goals achieved: Yes  Today's Interventions: Received pt supine in bed, pt agreeable to therapy after convincing, and reported pain in bilateral LEs but did not report pain level. Session focused on discharge planning, functional mobility/transfers, simulated car transfer, UE/LE strength, and improved endurance with activity. Pt donned shorts independently supine in bed. Pt performed bed mobility independently and performed PA transfer from bed<>WC with supervision. Pt performed WC mobility 147f with bilateral UE independently. Pt performed simulated AP/PA car transfer without AD supervision. Pt performed WC mobility 144fon uneven surfaces (ramp) independently using bilateral UE. Pt performed AP transfer  to Nustep and performed bilateral UE strengthening on Nustep for 5 minutes at workload 3 with supervision for a total of 161 steps. [facility]. Pt performed AP transfer WC<>bed with supervision. Concluded session with pt supine in bed, needs within reach, and bed alarm on.   PT Discharge Precautions/Restrictions Precautions Precautions: Fall Precaution Comments: bilateral AKA Restrictions Weight Bearing Restrictions: Yes RLE Weight Bearing: Non weight bearing LLE Weight Bearing: Non weight bearing Cognition Overall Cognitive Status: Within Functional Limits for tasks assessed Arousal/Alertness: Awake/alert Orientation Level: Oriented X4 Awareness: Appears intact Problem Solving: Appears intact Safety/Judgment: Appears intact Sensation Sensation Light Touch: Impaired Detail Light Touch Impaired Details: Impaired RLE;Impaired LLE Proprioception: Appears Intact Additional Comments: no light touch in distal LE or near inscision site, however able to detect at L2 dermatome Coordination Gross Motor Movements are Fluid and Coordinated: No Fine Motor Movements are Fluid and Coordinated: Yes Coordination and Movement Description: grossly uncoordinated due to bilateral AKA and pain Finger Nose Finger Test: WFDel[facility] Surgerye[facility] Shin Test: unable to perform due to bilateral AKA Motor  Motor Motor: Within Functional Limits Motor - Skilled Clinical Observations: limited by pain  Mobility Bed Mobility Bed Mobility: Rolling Right;Rolling Left;Supine to Sit;Sit to Supine Rolling Right: Independent Rolling Left: Independent Supine to Sit: Independent Sit to Supine: Independent Transfers Transfers: AnDevelopment worker, communitySupervision/Verbal cueing Locomotion  Gait Ambulation: No Gait Gait: No Stairs / Additional Locomotion Stairs: No WhArchitectYes Wheelchair Assistance: Independent with asCytogeneticistBoth upper extremities Wheelchair Parts Management: Independent Distance: [id number]/Postural Assessment  Cervical Assessment Cervical Assessment: Within Functional Limits Thoracic Assessment Thoracic Assessment: Within Functional Limits Lumbar Assessment Lumbar Assessment: Within Functional Limits Postural Control Postural Control: Deficits on evaluation  Balance Balance Balance Assessed:  Yes Static Sitting Balance Static Sitting - Balance Support: No upper extremity supported Static Sitting - Level of Assistance: 7: Independent Dynamic Sitting Balance Dynamic Sitting - Balance Support: No upper extremity supported Dynamic Sitting - Level of Assistance: 7: Independent Extremity Assessment  RLE Assessment RLE Assessment: Exceptions to [facility] General Strength Comments: grossly 3/5 hip flexion, abduction, adduction LLE Assessment LLE Assessment: Exceptions to [facility] Strength Comments: grossly 3/5 hip flexion, abduction, adduction  Alfonse Alpers PT, DPT  11/20/2019, 7:45 AM

## 2019-11-20 NOTE — Plan of Care (Signed)

## 2019-11-20 NOTE — Progress Notes (Addendum)
Occupational Therapy Discharge Summary  Patient Details  Name: [name] MRN: [id number] Date of Birth: Feb 18, 1961  Today's Date: 11/20/2019 OT Individual Time: 0254-2706 and 2376-2831 OT Individual Time Calculation (min): 73 min and 71 mins   Patient has met 4 of 4 long term goals due to improved activity tolerance, improved balance and ability to compensate for deficits.  Patient to discharge at overall Supervision level. Family education offered and scheduled but no family/caregiver attended therapy sessions.   Reasons goals not met: all goals met  Recommendation:  Patient will benefit from ongoing skilled OT services in home health setting to continue to advance functional skills in the area of BADL and iADL.  Equipment: has all equipment  Reasons for discharge: treatment goals met  Patient/family agrees with progress made and goals achieved: Yes   OT Discharge: Session 1: Upon entering the room, pt supine in bed and reports 7/10 pain in B LEs. Pt agreeable to OT intervention with encouragement. Pt verbalized, " I need to get washed up anyways because the bed is wet". OT discussing the importance of not being in a soiled bed secondary to skin integrity concerns. Pt verbalized understanding but did not state why she did not call for assistance once she knew bed was wet. OT setting up basin bath from bed level and pt performed on her own. Pt donning shirt, brief, and LB clothing with set up to obtain clothing only. Pt needing to roll L <> R while in bed for LB clothing management but did so independently. Pt performing posterior transfer into wheelchair from bed. Pt propelled wheelchair for B UE strengthening. Pt reporting she will be sleeping on a couch at home similar to ADL apartment. She refused slide board transfer and performed anterior<> posterior transfer on uneven surface onto sofa and then back to wheelchair with supervision. OT continues to not recommend this method. Pt  propelled wheelchair back to room and set up her transfer with increased time and returned to bed in same manner as above. Call bell and all needed items within reach upon exiting the room.   Session 2: Family not present for scheduled family education this session. Pt very tearful and needing redirection this session as she continues to talk about leaving for [place] to see family. OT notifying pt that stitches and staples may not be removed until 3-4 weeks after procedure and she became very upset and verbalized she was " not coming back to the hospital for anything". OT provided pt with paper handout of exercises and level 3 resistive theraband for B UE strengthening. OT demonstrated shoulder elevation, shoulder diagonals, bicep curl, alternating punches, and chest pulls. Pt returning demonstrations with min cuing for proper technique. OT continued education regarding safety at home with transfers and to use equipment provided. Pt verbalized, " I'm gonna do it the way I want to and how I know I can." Pt remained in bed and sitting up eating lunch with all needs within reach. Bed alarm activated and call bell within reach. Pt called for pain medication this session and received that as well. Pt reports no further concerns at this time.   OT Discharge Precautions/Restrictions  Precautions Precautions: Fall Restrictions Weight Bearing Restrictions: Yes RLE Weight Bearing: Non weight bearing LLE Weight Bearing: Non weight bearing   Pain Pain Assessment Pain Scale: 0-10 Pain Score: 7  Pain Type: Surgical pain Pain Location: Incision Pain Orientation: Left;Right Pain Descriptors / Indicators: Aching Pain Onset: On-going Patients Stated Pain Goal:  3 Pain Intervention(s): Repositioned ADL ADL Eating: Modified independent Where Assessed-Eating: Bed level Grooming: Modified independent Where Assessed-Grooming: Bed level Upper Body Bathing: Supervision/safety Where Assessed-Upper Body Bathing:  Bed level Lower Body Bathing: Minimal assistance Where Assessed-Lower Body Bathing: Bed level Upper Body Dressing: Minimal assistance Where Assessed-Upper Body Dressing: Bed level Lower Body Dressing: Unable to assess Toileting: Unable to assess Vision Baseline Vision/History: No visual deficits Patient Visual Report: No change from baseline Cognition Overall Cognitive Status: Within Functional Limits for tasks assessed Arousal/Alertness: Awake/alert Orientation Level: Oriented X4 Sensation Sensation Light Touch: Impaired Detail Light Touch Impaired Details: Impaired RLE;Impaired LLE Additional Comments: no light touch in distal LE or near inscision site Coordination Gross Motor Movements are Fluid and Coordinated: No Fine Motor Movements are Fluid and Coordinated: Yes Motor  Motor Motor: Within Functional Limits Mobility  Bed Mobility Bed Mobility: Rolling Right;Rolling Left;Supine to Sit Rolling Right: Supervision/verbal cueing Rolling Left: Supervision/Verbal cueing  Trunk/Postural Assessment  Cervical Assessment Cervical Assessment: Within Functional Limits Thoracic Assessment Thoracic Assessment: Within Functional Limits Lumbar Assessment Lumbar Assessment: Within Functional Limits  Balance Balance Balance Assessed: Yes Dynamic Sitting Balance Dynamic Sitting - Balance Support: During functional activity Dynamic Sitting - Level of Assistance: 5: Stand by assistance Extremity/Trunk Assessment RUE Assessment RUE Assessment: Within Functional Limits LUE Assessment LUE Assessment: Within Functional Limits   Gypsy Decant 11/20/2019, 11:21 AM

## 2019-11-21 ENCOUNTER — Other Ambulatory Visit: Payer: Self-pay | Admitting: Physical Medicine and Rehabilitation

## 2019-11-21 MED ORDER — GABAPENTIN 300 MG PO CAPS: 300.0000 mg | ORAL_CAPSULE | Freq: Three times a day (TID) | ORAL | 0 refills | Status: AC

## 2019-11-21 MED ORDER — MAGNESIUM OXIDE 400 (241.3 MG) MG PO TABS: 400.0000 mg | ORAL_TABLET | Freq: Two times a day (BID) | ORAL | 0 refills | Status: AC

## 2019-11-21 MED ORDER — AMLODIPINE BESYLATE 5 MG PO TABS: 5.0000 mg | ORAL_TABLET | Freq: Every day | ORAL | 0 refills | Status: AC

## 2019-11-21 MED ORDER — POLYETHYLENE GLYCOL 3350 17 G PO PACK: 17.0000 g | PACK | Freq: Every day | ORAL | 0 refills | Status: AC

## 2019-11-21 MED ORDER — LOSARTAN POTASSIUM 25 MG PO TABS: 25.0000 mg | ORAL_TABLET | Freq: Every day | ORAL | 0 refills | Status: AC

## 2019-11-21 NOTE — Discharge Instructions (Signed)
Inpatient Rehab Discharge Instructions  [name] Discharge date and time:  11/21/19  Activities/Precautions/ Functional Status: Activity: activity as tolerated Diet: regular diet Wound Care: Wash with soap. Keep clean and dry. Contact Dr. Sharol Given if you develop any problems with your incision/wound--redness, swelling, increase in pain, drainage or if you develop fever or chills.   Functional status:  ___ No restrictions     ___ Walk up steps independently _X__ 24/7 supervision/assistance   ___ Walk up steps with assistance ___ Intermittent supervision/assistance  ___ Bathe/dress independently ___ Walk with walker     ___ Bathe/dress with assistance ___ Walk Independently    ___ Shower independently ___ Walk with assistance    ___ Shower with assistance _X__ No alcohol     ___ Return to work/school ________   COMMUNITY REFERRALS UPON DISCHARGE:  Home Health: PT, OT   Agency:[facility] Phone:[phone]  Medical Equipment/Items Ordered:No DME ordered as patient obtained W/C, basic cushion,  transfer board, amputee support pads, and TTB, at last admission 04/17/2017. Also received [facility]  06/05/2016  Agency/Supplier:[facility]  Special Instructions:    My questions have been answered and I understand these instructions. I will adhere to these goals and the provided educational materials after my discharge from the hospital.  Patient/Caregiver Signature _______________________________ Date __________  Clinician Signature _______________________________________ Date __________  Please bring this form and your medication list with you to all your follow-up doctor's appointments.

## 2019-11-21 NOTE — Telephone Encounter (Signed)
Needs to be refilled and managed by her primary care provider.

## 2019-11-21 NOTE — Progress Notes (Signed)
[facility] PHYSICAL MEDICINE & REHABILITATION PROGRESS NOTE   Subjective/Complaints: Patient seen sitting up in bed this morning.  She states she slept well overnight.  She states she is ready for discharge.   ROS: Denies SOB, CP, N/V/D.    Objective:   No results found. Recent Labs    11/20/19 0605  WBC 7.2  HGB 9.2*  HCT 30.6*  PLT 354   Recent Labs    11/20/19 0605  NA 136  K 4.7  CL 105  CO2 22  GLUCOSE 90  BUN 15  CREATININE 0.51  CALCIUM 9.0    Intake/Output Summary (Last 24 hours) at 11/21/2019 0804 Last data filed at 11/21/2019 0606 Gross per 24 hour  Intake 780 ml  Output 600 ml  Net 180 ml     Physical Exam: Labs and vitals reviewed   Vital Signs Blood pressure 106/85, pulse 87, temperature 97.8 F (36.6 C), resp. rate 18, SpO2 99 %. Constitutional: No distress . Vital signs reviewed.  Cachectic. HENT: Normocephalic.  Atraumatic. Eyes: EOMI. No discharge. Cardiovascular: No JVD. Respiratory: Normal effort.  No stridor. GI: Non-distended. Skin: Bilateral AKA's C/D/I Bilateral hand deformities Psych: Flat. Musc: No edema in extremities.  No tenderness in extremities. Neurological: Alert Motor: B/l UE: 4/5 proximal to distal, unchanged B/l LE: HF 2/5 (pain inhibition)  Assessment/Plan: 1. 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  Physiatrist is providing close team supervision and 24 hour management of active medical problems listed below.  Physiatrist and rehab team continue to assess barriers to discharge/monitor patient progress toward functional and medical goals  Care Tool:  Bathing    Body parts bathed by patient: Right arm, Left arm, Abdomen, Chest, Front perineal area, Face, Right upper leg, Left upper leg, Buttocks   Body parts bathed by helper: Buttocks Body parts n/a: Right  lower leg, Left lower leg   Bathing assist Assist Level: Minimal Assistance - Patient > 75%     Upper Body Dressing/Undressing Upper body dressing   What is the patient wearing?: Pull over shirt    Upper body assist Assist Level: Contact Guard/Touching assist    Lower Body Dressing/Undressing Lower body dressing    Lower body dressing activity did not occur: Safety/medical concerns(B wound vac, high AKA) What is the patient wearing?: Incontinence brief     Lower body assist Assist for lower body dressing: Moderate Assistance - Patient 17 - 74%     Toileting Toileting Toileting Activity did not occur Landscape architect and hygiene only): N/A (no void or bm)  Toileting assist Assist for toileting: Minimal Assistance - Patient > 75%     Transfers Chair/bed transfer  Transfers assist  Chair/bed transfer activity did not occur: Refused  Chair/bed transfer assist level: Supervision/Verbal cueing     Locomotion Ambulation   Ambulation assist   Ambulation activity did not occur: N/A(bilateral AKA; no prosthesis)          Walk 10 feet activity   Assist  Walk 10 feet activity did not occur: N/A(bilateral AKA; no prosthesis)        Walk 50 feet activity   Assist Walk 50 feet with 2 turns activity did not occur: N/A(bilateral AKA; no prosthesis)         Walk 150 feet activity   Assist Walk 150 feet activity did not occur: N/A(bilateral AKA; no prosthesis)  Walk 10 feet on uneven surface  activity   Assist Walk 10 feet on uneven surfaces activity did not occur: N/A(bilateral AKA; no prosthesis)         Wheelchair     Assist Will patient use wheelchair at discharge?: Yes Type of Wheelchair: Manual Wheelchair activity did not occur: Refused  Wheelchair assist level: Independent Max wheelchair distance: 135f    Wheelchair 50 feet with 2 turns activity    Assist    Wheelchair 50 feet with 2 turns activity did not  occur: Refused   Assist Level: Independent   Wheelchair 150 feet activity     Assist  Wheelchair 150 feet activity did not occur: Refused   Assist Level: Independent   Blood pressure 106/85, pulse 87, temperature 97.8 F (36.6 C), resp. rate 18, SpO2 99 %.  Medical Problem List and Plan: 1.  Deficits with mobility, transfers, self-care secondary to bilateral AKA revision.             DC today  Will see patient for transitional care management in 1-2 weeks post-discharge  2.  Antithrombotics: -DVT/anticoagulation:  Pharmaceutical: Lovenox             -antiplatelet therapy: N/A 3. Chronic pain/Pain Management:   Norco to q4 hours prn, changed to oxycodone every 4 as needed-plan to wean prior to discharge due to history of substance abuse.  Reduce oxy IR to 117mq8h prn, decrease to 5 every 8 on 2/15-will not prescribe her discharge given history  Gabapentin 300 mg QHS for nerve pain, increased to 3 times daily on 2/9             Monitor with increased mobility 4. Mood: LCSW to follow for evaluation and support.              -antipsychotic agents: N/A 5. Neuropsych: This patient is capable of making decisions on her own behalf. 6. Skin/Wound Care: Monitor wounds for healing   Rheumatology consult unavailable, will refer to Derm as outpatient  DC VAC yesterday; tolerated well.  7. Fluids/Electrolytes/Nutrition: Monitor I/O.  Increasing po fluid  8. HTN: Monitor BP   Norvasc, decreased to 5 mg on 2/12.  Controlled on 2/16    Vitals:   11/20/19 1927 11/21/19 0605  BP: 121/61 106/85  Pulse: 91 87  Resp: 18 18  Temp: 98.5 F (36.9 C) 97.8 F (36.6 C)  SpO2: 100% 99%  9. Leucocytosis: Resolved  Continue to monitor for signs of infection.  10. Hypokalemia:   Potassium 4.7 2/15  Resolved off supplement 11.  Transaminitis  Resolved 12. Tobacco use/Cocaine abuse: Continue to educate on importance of cessation.  13.  Hypoalbuminemia  Supplement initiated 14.   Hyponatremia  Resolved   Continue to monitor 15.  Anemia of chronic disease  Hemoglobin 9.2 on 2/15, monitor as outpatient  Continue to monitor  LOS: 11 days A FACE TO FACE EVALUATION WAS PERFORMED  Loyce Klasen AnLorie Phenix/16/2021, 8:04 AM

## 2019-11-21 NOTE — Discharge Summary (Addendum)
Physician Discharge Summary  [name] MRN: [id number] DOB/AGE: [birth year] 57 y.o.  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 Aortic atherosclerosis. Linear scarring in the right upper lobe, stable. No acute confluent opacities or effusions. No acute bony abnormality. IMPRESSION: Cardiomegaly.  No acute cardiopulmonary disease. Electronically Signed   By: Rolm Baptise M.D.   On: 11/15/2019 20:26     Labs:  Basic Metabolic Panel: Recent Labs  Lab 11/16/19 0556 11/20/19 0605  NA 137 136  K 4.7 4.7  CL 99 105  CO2 23 22  GLUCOSE 105* 90  BUN 25* 15  CREATININE 0.75 0.51  CALCIUM 9.5 9.0    CBC: CBC Latest Ref Rng & Units 11/20/2019 11/15/2019 11/13/2019  WBC 4.0 - 10.5 K/uL 7.2 7.9 9.7  Hemoglobin 12.0 - 15.0 g/dL 9.2(L) 9.5(L) 10.3(L)  Hematocrit 36.0 - 46.0 % 30.6(L) 30.8(L) 33.3(L)  Platelets 150 - 400 K/uL 354 358 341    Hepatic Function Latest Ref Rng & Units 11/16/2019 11/15/2019 11/11/2019  Total Protein 6.5 - 8.1 g/dL 6.6 6.5 5.9(L)  Albumin 3.5 - 5.0 g/dL 2.7(L) 2.6(L) 2.4(L)  AST 15 - 41 U/L 41 46(H) 47(H)  ALT 0 - 44 U/L 47(H) 53(H) 66(H)  Alk Phosphatase 38 - 126 U/L 114 122 78  Total Bilirubin 0.3 - 1.2 mg/dL 1.3(H) 1.5(H) 1.9(H)  Bilirubin, Direct 0.0 - 0.2 mg/dL - - -   CBG: No results for input(s): GLUCAP in the last 168 hours.  Brief HPI:   [name] is a 59 y.o. female with  History of  HTN, cocaine induced vasculopathy, s/p B-AKA, medical noncompliance, ongoing substance abuse who was admitted on 11/04/19 with painful draining ulcers on bilateral AKA's and she was started on broad spectrum antibiotics for sepsis due to cellulitis. UDS positive for opiates and cocaine. MRI right thigh showed marked soft tissue edema with ascites and she was taken to OR on 11/08/19 for I & D of necrotic tissue with placement of Prevena VAC by Dr. Sharol Given. Post op antibiotics narrowed to doxycyline for 7 days. Therapy evaluations done showing deficits in mobility and ADLs. CIR recommended for follow up therapy.    Hospital Course: [name] was admitted to rehab 11/10/2019 for inpatient therapies to consist of PT and OT at least three hours five days a week. Past admission physiatrist, therapy team and rehab RN have worked together to provide customized collaborative inpatient rehab.  Wound VAC were removed on POD# 7 without difficulty and bilateral AKA incisions are healing well without signs or symtoms of infection. Sutures and staples remain in place. She continued to complain of pain and gabapentin was titrated up to 300 mg tid. Hydrocodone was changed to oxycodone for better pain control and weaned off by discharge due to patient's report of ongoing cocaine use.   Follow up labs showed that hyponatremia and pre-renal azotemia has resolved. Hypokalemia has resolved after brief supplementation. Follow up CBC showed ABLA is stable and  she is to follow up with PCP for follow up labs. She did report eruption of skin lesion on RUE due to tylenol in Vicodin and this was discontinued. Lesions are  Resolving and she has bee referred to dermatology for evaluation after discharge. She did require encouragement for participation which has improved. She has progressed to supervision level and will continue to receive follow up HHPT and [place] [facility] after discharge.     Rehab course: During patient's stay in  rehab weekly team conferences were held to monitor patient's progress, set goals and discuss barriers to discharge. At admission, patient required min assist with basic self care tasks and total assist with mobility.  She  has had improvement in activity tolerance, balance, postural control as well as ability to compensate for deficits. She is able to complete ADL tasks with supervision. She is able to perform AP transfers with supervision and is able to propel her wheelchair for 150' independently. Her family and friend were contacted for family education but did not attend any formal training. Patient reported that her brother would provide care needed after discharge.     Discharge disposition: 01-Home or Self Care  Diet:  Regular.  Special Instructions: 1. Wash incision with soap and water. Keep clean and dry. Absolutely no smoking.  2. Need to keep MD appointments.  3. Recommend repeat LFTs and CBC for follow in 1-2 weeks.    Discharge Instructions     Ambulatory referral to Physical Medicine Rehab   Complete by: As directed    1-2 weeks TC appt      Allergies as of 11/21/2019       Reactions   Levamisole    Levamisole is a chemical adulterant of cocaine which can cause cutaneous necrosis It is used as an Health and safety inspector in veterinary medicine   Acetaminophen Swelling, Other (See Comments)   "Eyelid swelling" but apparently tolerates Percocet and Vicodin just fine...   Haldol [haloperidol Lactate]    Possible eye swelling was given together with Toradol unsure if caused the reaction   Lisinopril    UNSPECIFIED REACTION    Toradol [ketorolac Tromethamine]    Eye swelling   Tramadol Rash        Medication List     STOP taking these medications    doxycycline 100 MG tablet Commonly known as: VIBRA-TABS   lidocaine 5 % ointment Commonly known as: XYLOCAINE   oxyCODONE 5 MG immediate release tablet Commonly known as: Oxy IR/ROXICODONE   potassium chloride SA  20 MEQ tablet Commonly known as: KLOR-CON       TAKE these medications    amLODipine 5 MG tablet Commonly known as: NORVASC Take 1 tablet (5 mg total) by mouth daily. What changed:  medication strength how much to take   gabapentin 300 MG capsule Commonly known as: NEURONTIN Take 1 capsule (300 mg total) by mouth 3 (three) times daily.   losartan 25 MG tablet Commonly known as: COZAAR Take 1 tablet (25 mg total) by mouth daily.   magnesium oxide 400 (241.3 Mg) MG tablet Commonly known as: MAG-OX Take 1 tablet (400 mg total) by mouth 2 (two) times daily.   multivitamin with minerals Tabs tablet Take 1 tablet by mouth daily.   polyethylene glycol 17 g packet Commonly known as: MIRALAX / GLYCOLAX Take 17 g by mouth daily.       Follow-up Information     Azzie Glatter, FNP Follow up on 11/28/2019.  Specialty: Family Medicine Why: Be there at 9:50 am for follow up appointment Contact information: [place] [address] [phone]         Jamse Arn, MD Follow up.   Specialty: Physical Medicine and Rehabilitation Why: Office will call you with follow up appointment Contact information: [address] [place] [address] [phone]         Newt Minion, MD Follow up.   Specialty: Orthopedic Surgery Why: Call for follow up appointment Contact information: [place] [address] [phone]         Lavell Luster Follow up.   Why: Appointment to evaluate skin lesions on May 10th --be there at 1:45 pm/2 pm appointment. Contact information: [address],  [place], [place]           Signed: Bary Leriche 11/29/2019, 9:34 AM Patient was seen, face-face, and physical exam performed by me on day of discharge, less than 30 minutes of total time spent.. Please see progress note from day of discharge as well.  Delice Lesch, MD, ABPMR

## 2019-11-21 NOTE — Progress Notes (Signed)
Pt checked throughout shift for safety. No s/s of distress. No c/o pain. Bed low with wheels locked and call ell within reach.

## 2019-11-24 ENCOUNTER — Telehealth: Payer: Self-pay | Admitting: Family Medicine

## 2019-11-24 NOTE — Telephone Encounter (Signed)
Pt called asking for a medication refill on oxycodone needed by tomorrow. However, they have not been seen here

## 2019-11-24 NOTE — Telephone Encounter (Signed)
Correction: Hasn't been seen here in a long time.

## 2019-11-25 IMAGING — DX DG TIBIA/FIBULA 2V*R*
2 series · 2 of 2 positions shown · non-contrast
Comparison: Prior radiograph from 10/23/2017.

CLINICAL DATA: Initial evaluation for acute trauma, fall.

EXAM:
RIGHT TIBIA AND FIBULA - 2 VIEW

[tibia ap]
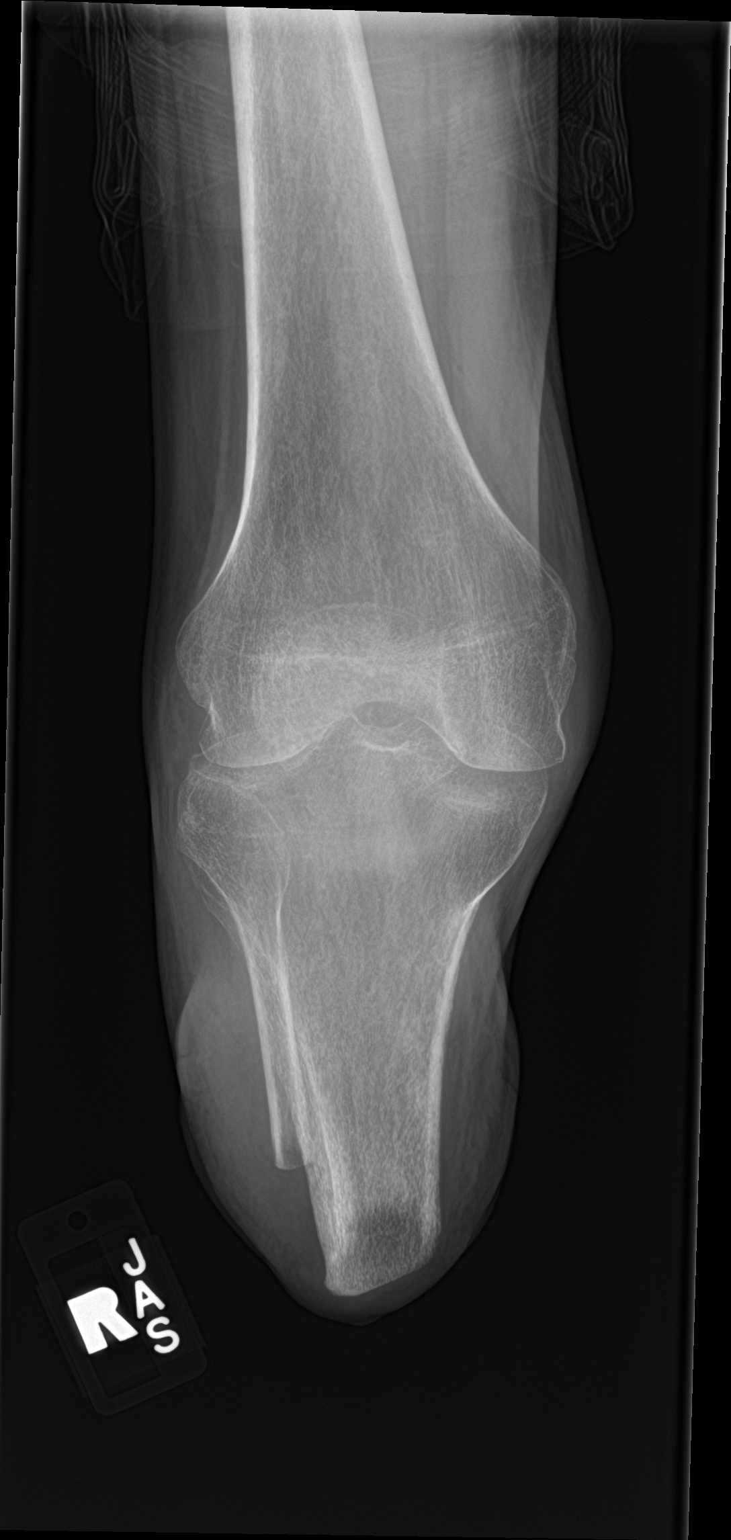

[tibia lat]
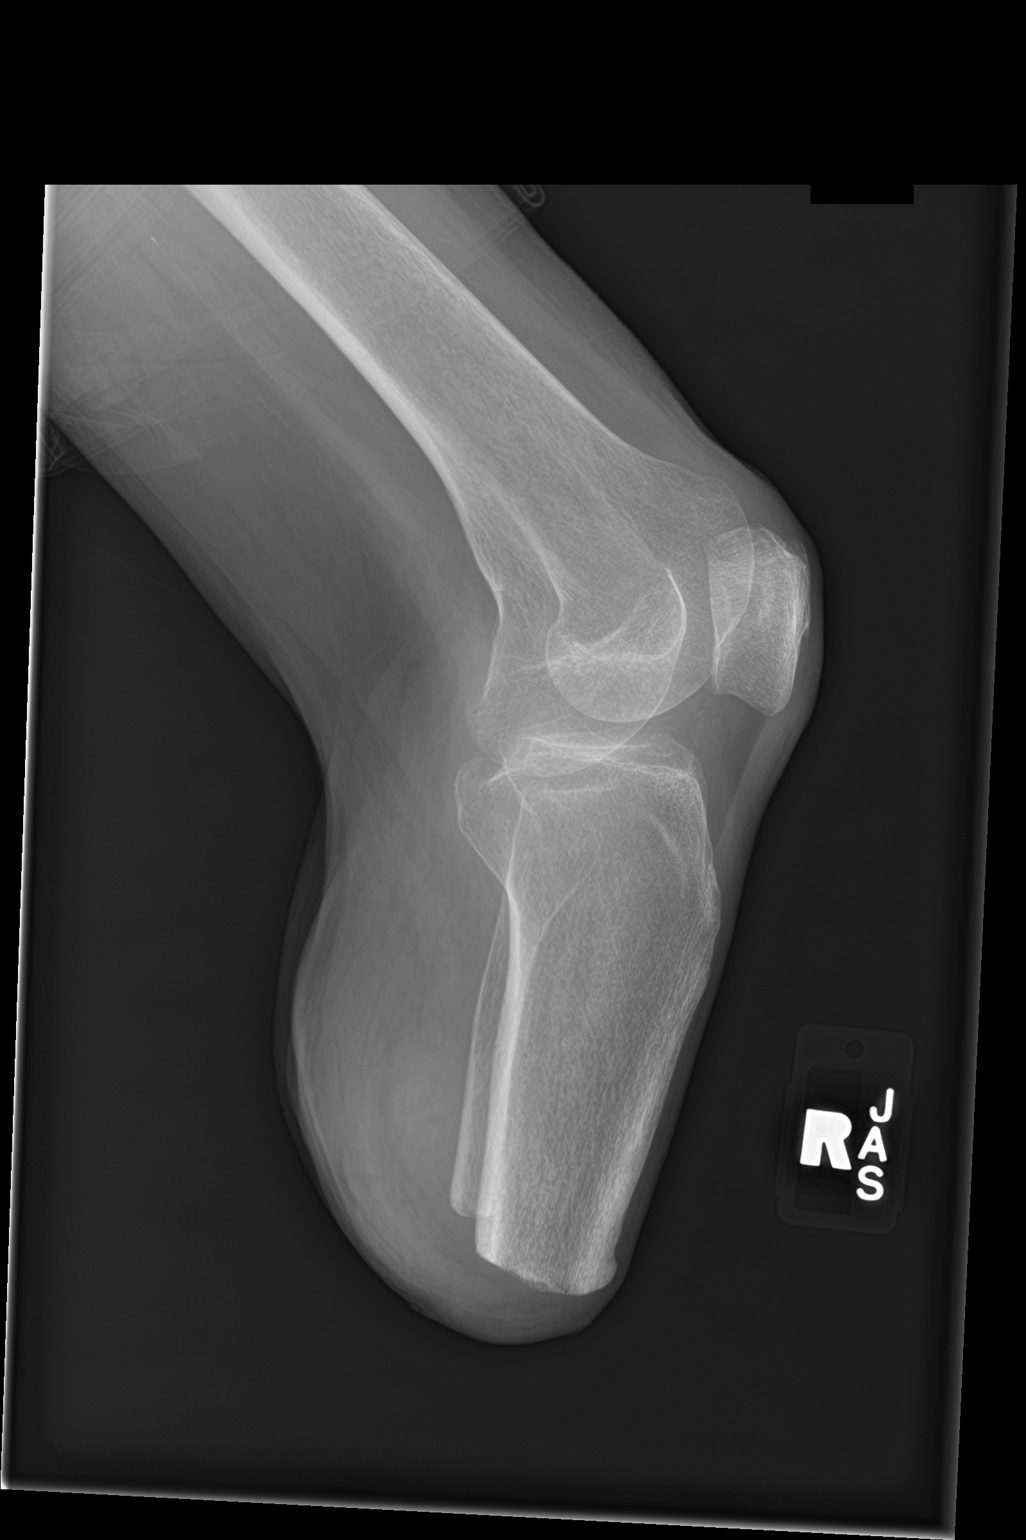

[2 of 2 positions shown; findings below may reference images not displayed]

FINDINGS: Status post previous BKA. No acute fracture or dislocation. No joint
effusion. No acute soft tissue abnormality.
IMPRESSION: 1. No acute osseous abnormality about the right knee.
2. Status post BKA.

## 2019-11-27 ENCOUNTER — Telehealth: Payer: Self-pay

## 2019-11-27 ENCOUNTER — Telehealth: Payer: Self-pay | Admitting: Family Medicine

## 2019-11-27 NOTE — Telephone Encounter (Signed)
I can not reach patient. Mail box full.

## 2019-11-27 NOTE — Telephone Encounter (Signed)
Call Pt several  Time she has a appointment 11/28/2019 she is requesting pain meds her mailbox is full you can not leave a message

## 2019-11-28 ENCOUNTER — Ambulatory Visit: Payer: Self-pay | Admitting: Family Medicine

## 2019-12-01 IMAGING — DX DG KNEE 1-2V*L*
3 series · 3 of 3 positions shown · non-contrast
Comparison: 12/11/2017

CLINICAL DATA: Fall.

EXAM:
LEFT KNEE - 1-2 VIEW

[knee ap (1 of 2)]
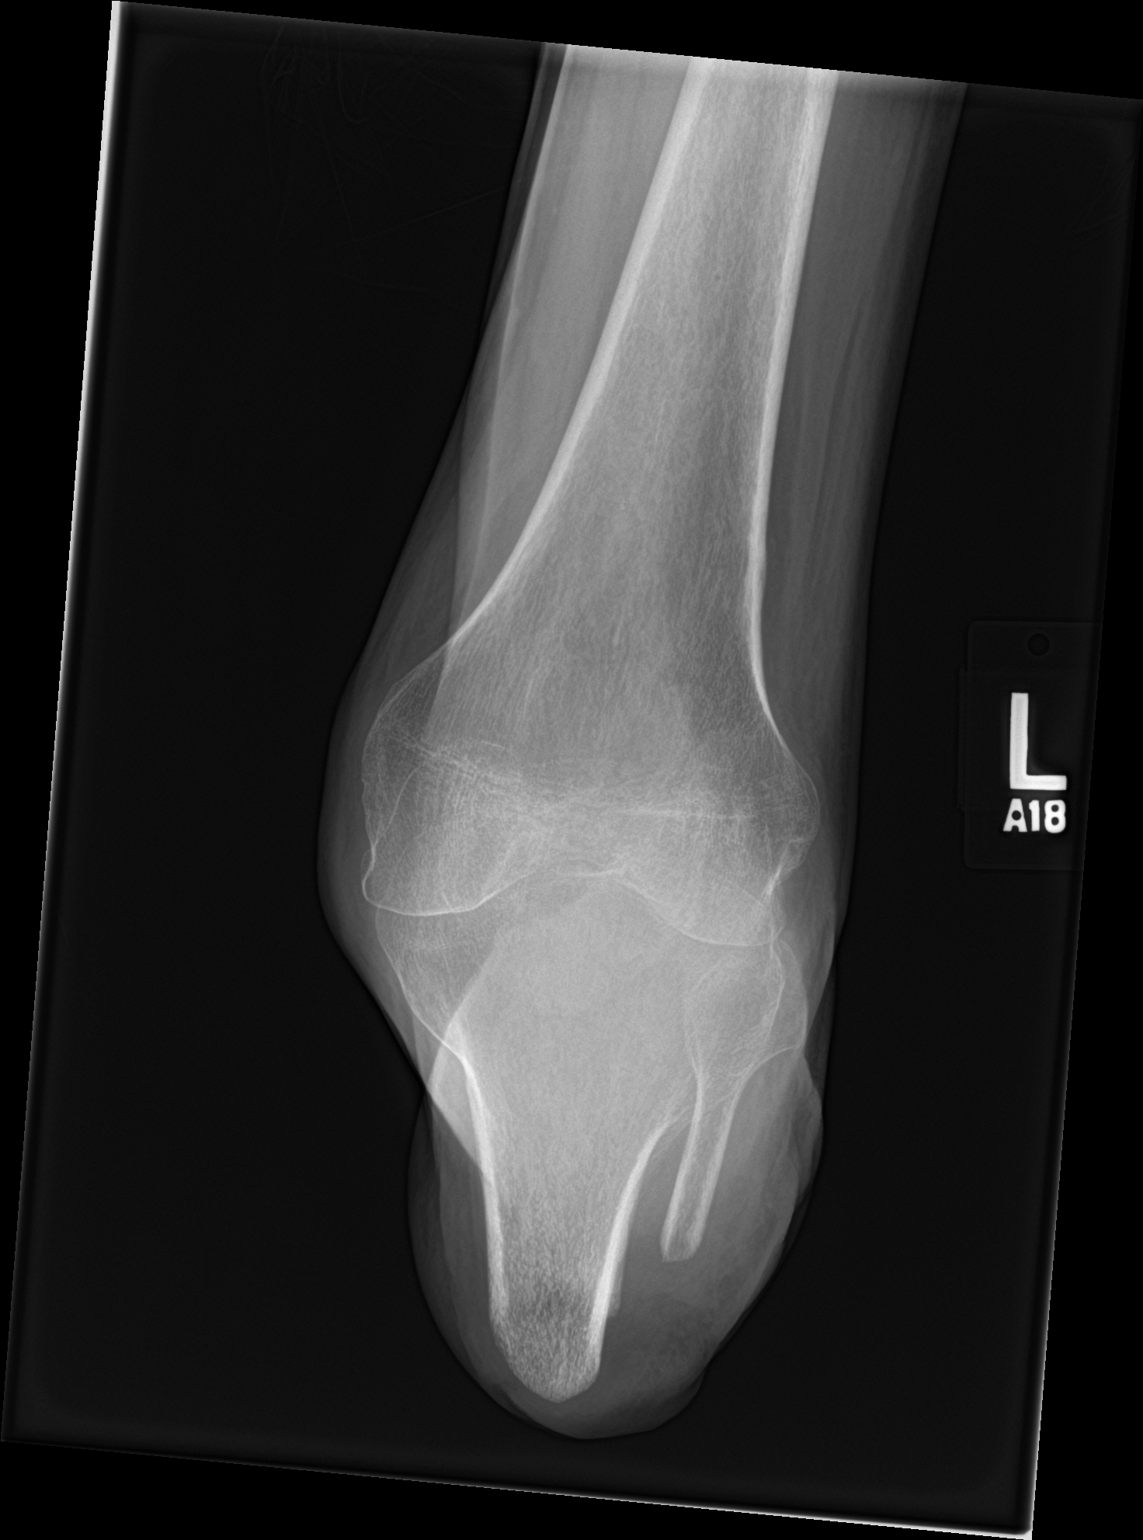

[knee lat]
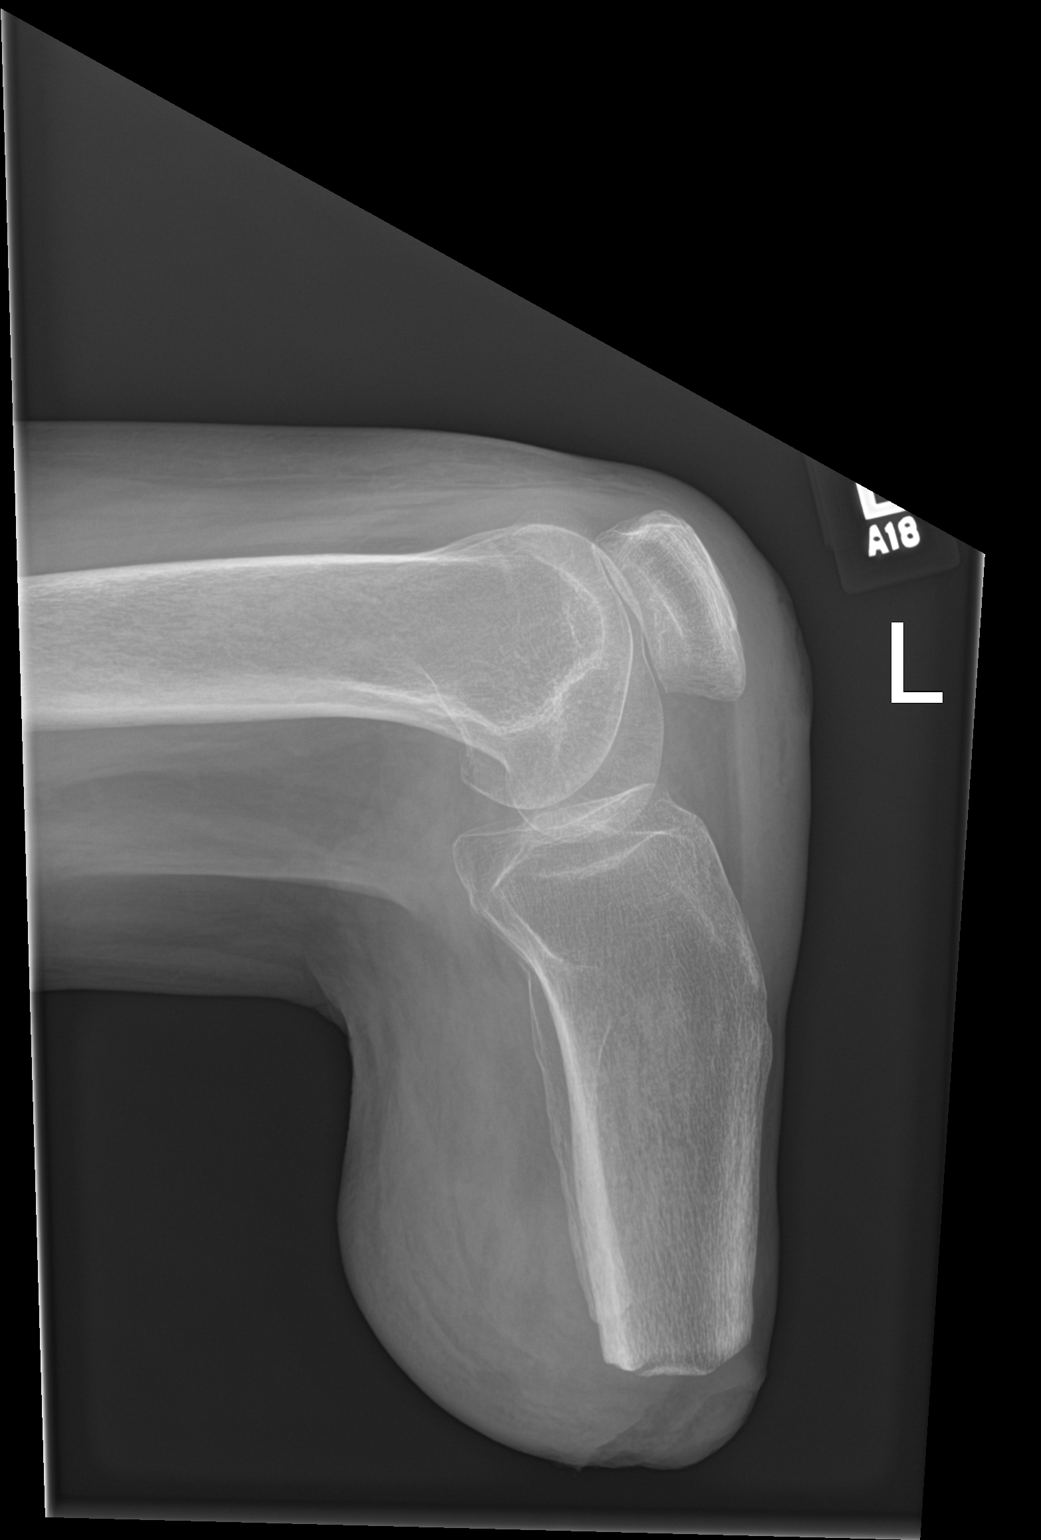

[knee ap (2 of 2)]
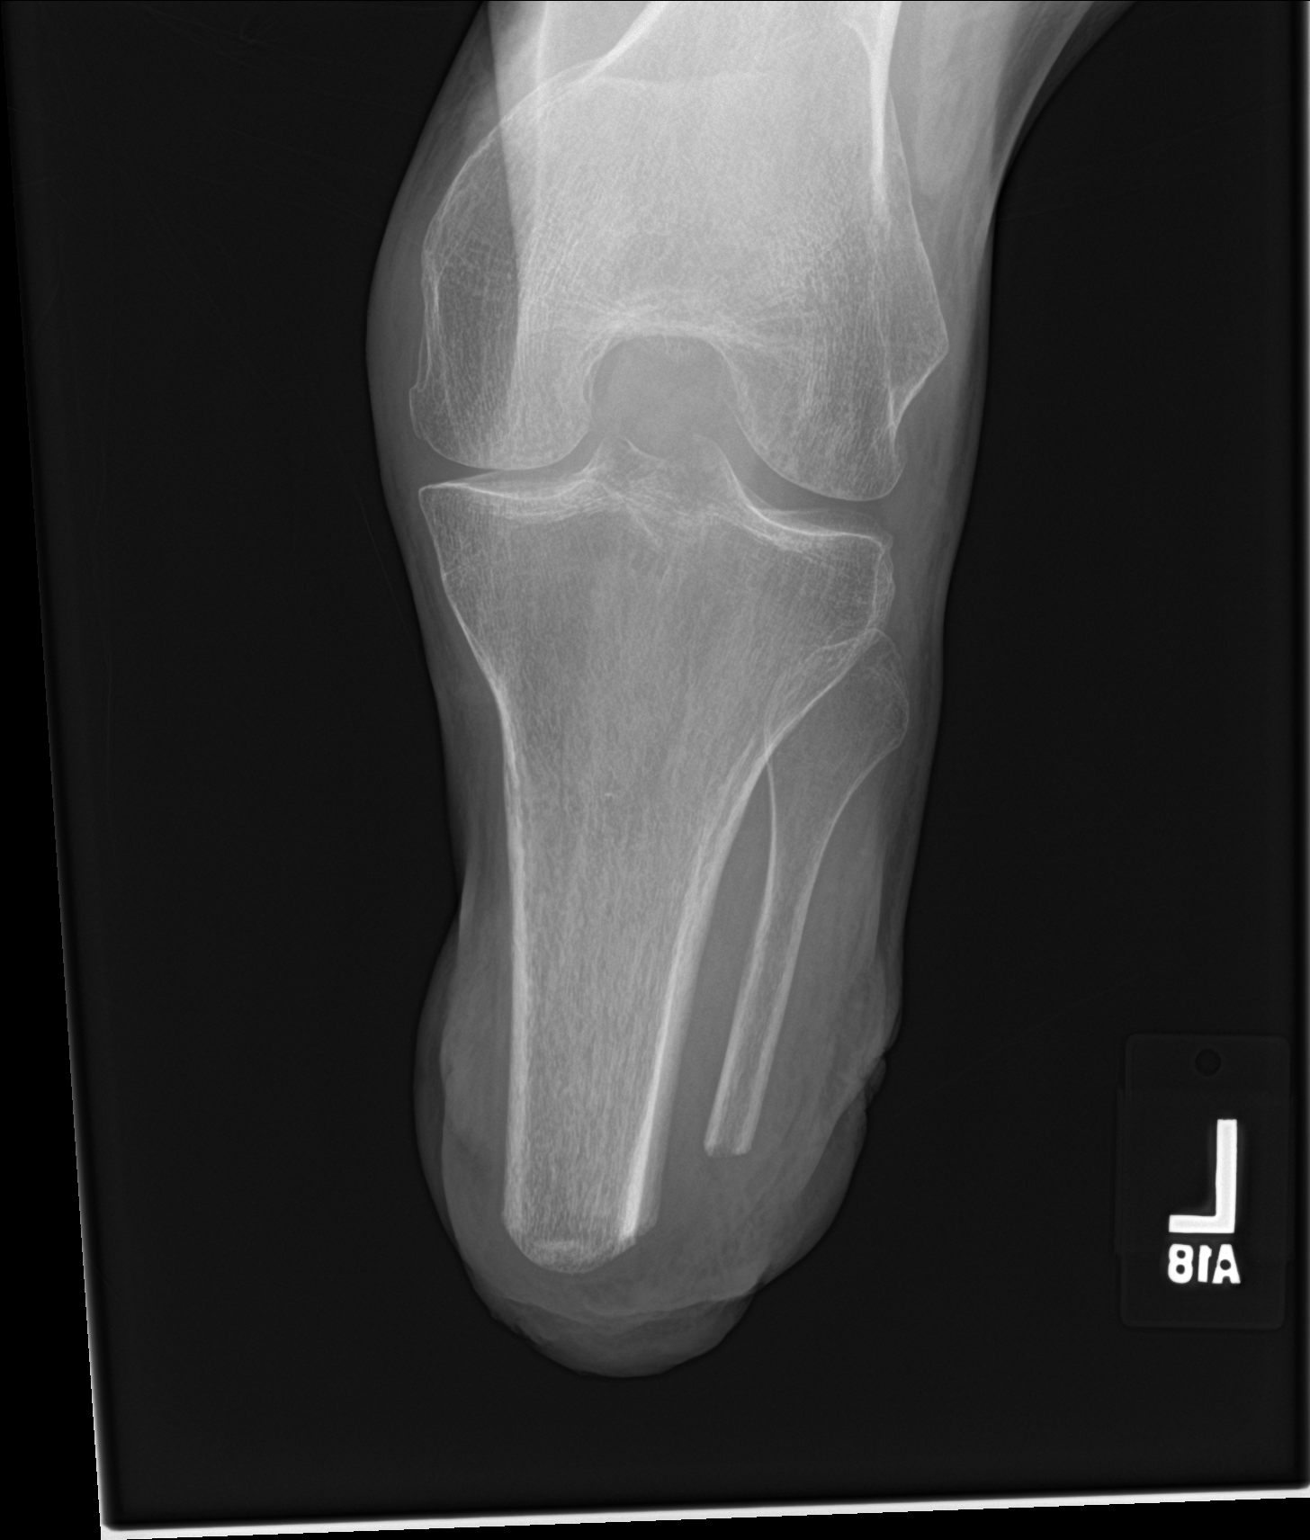

[3 of 3 positions shown; findings below may reference images not displayed]

FINDINGS: Osteotomies in the proximal tibia and fibula. No fracture or
dislocation.
IMPRESSION: No fracture or dislocation.

## 2019-12-01 IMAGING — DX DG KNEE 1-2V*R*
3 series · 3 of 3 positions shown · non-contrast
Comparison: None.

CLINICAL DATA: Laceration after fall.

EXAM:
RIGHT KNEE - 1-2 VIEW

[knee ap (1 of 2)]
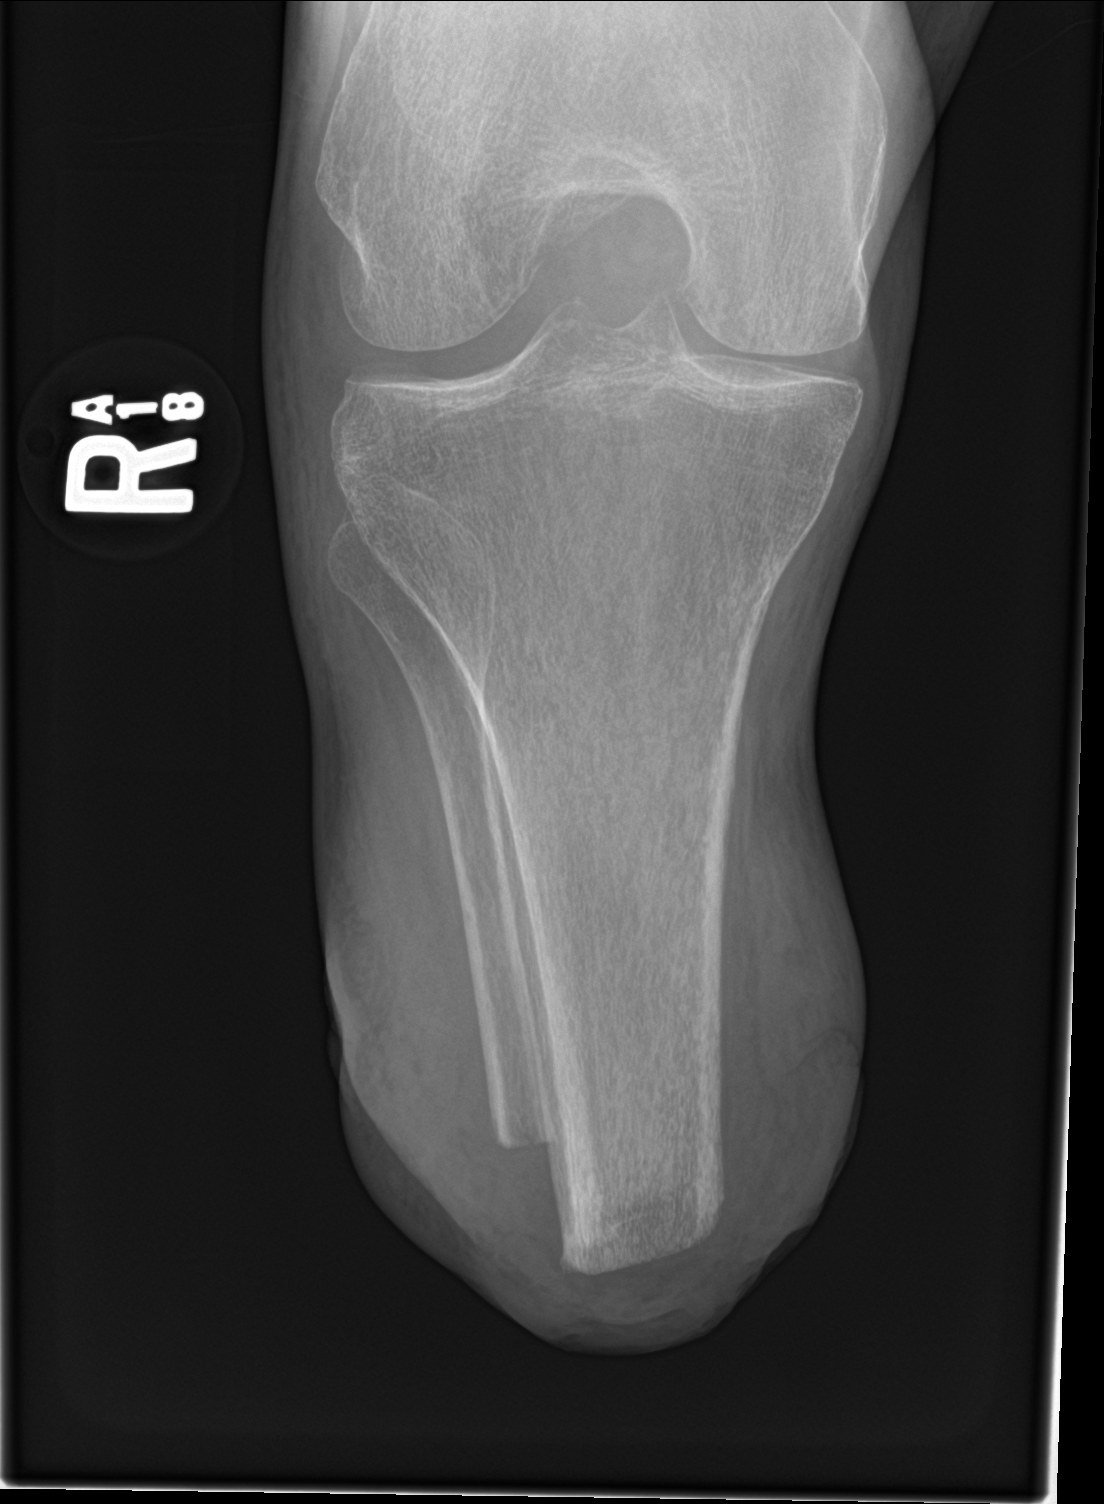

[knee lat]
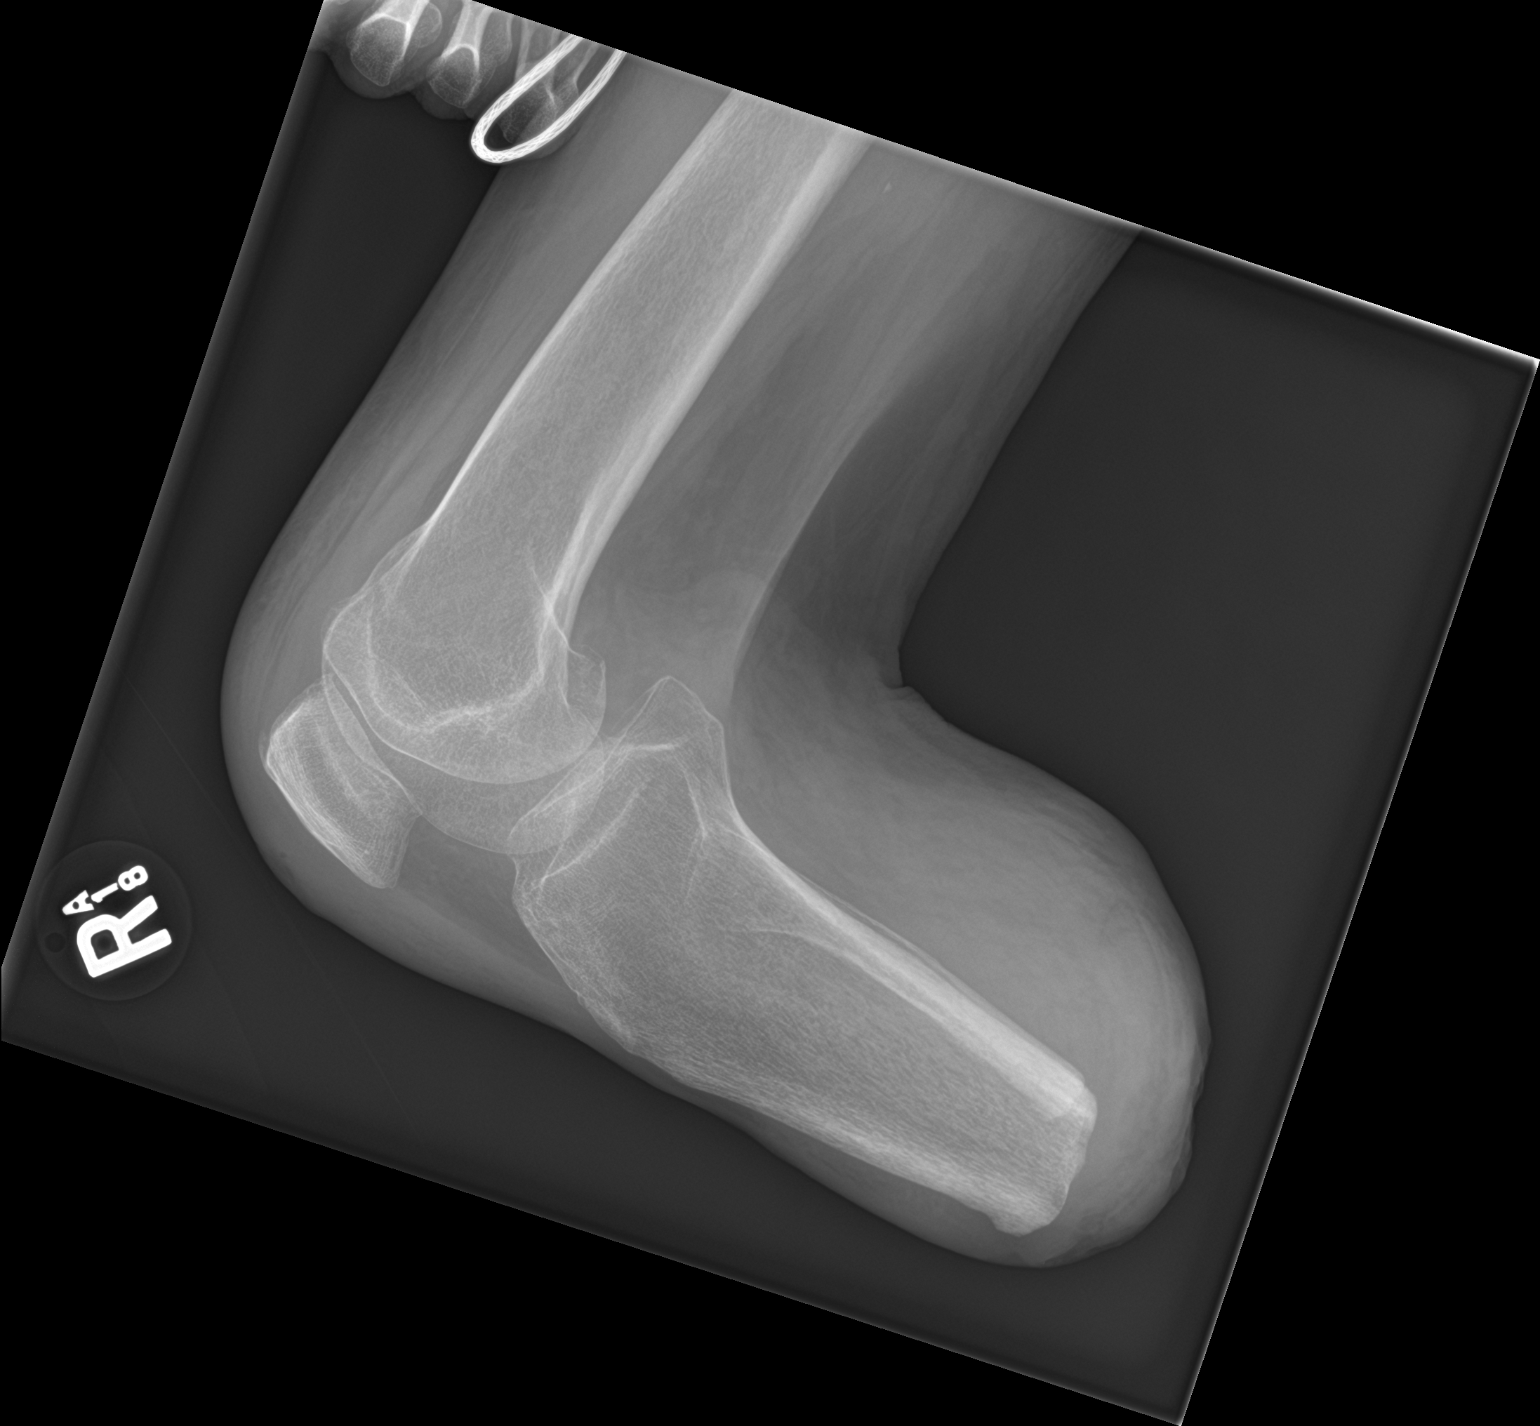

[knee ap (2 of 2)]
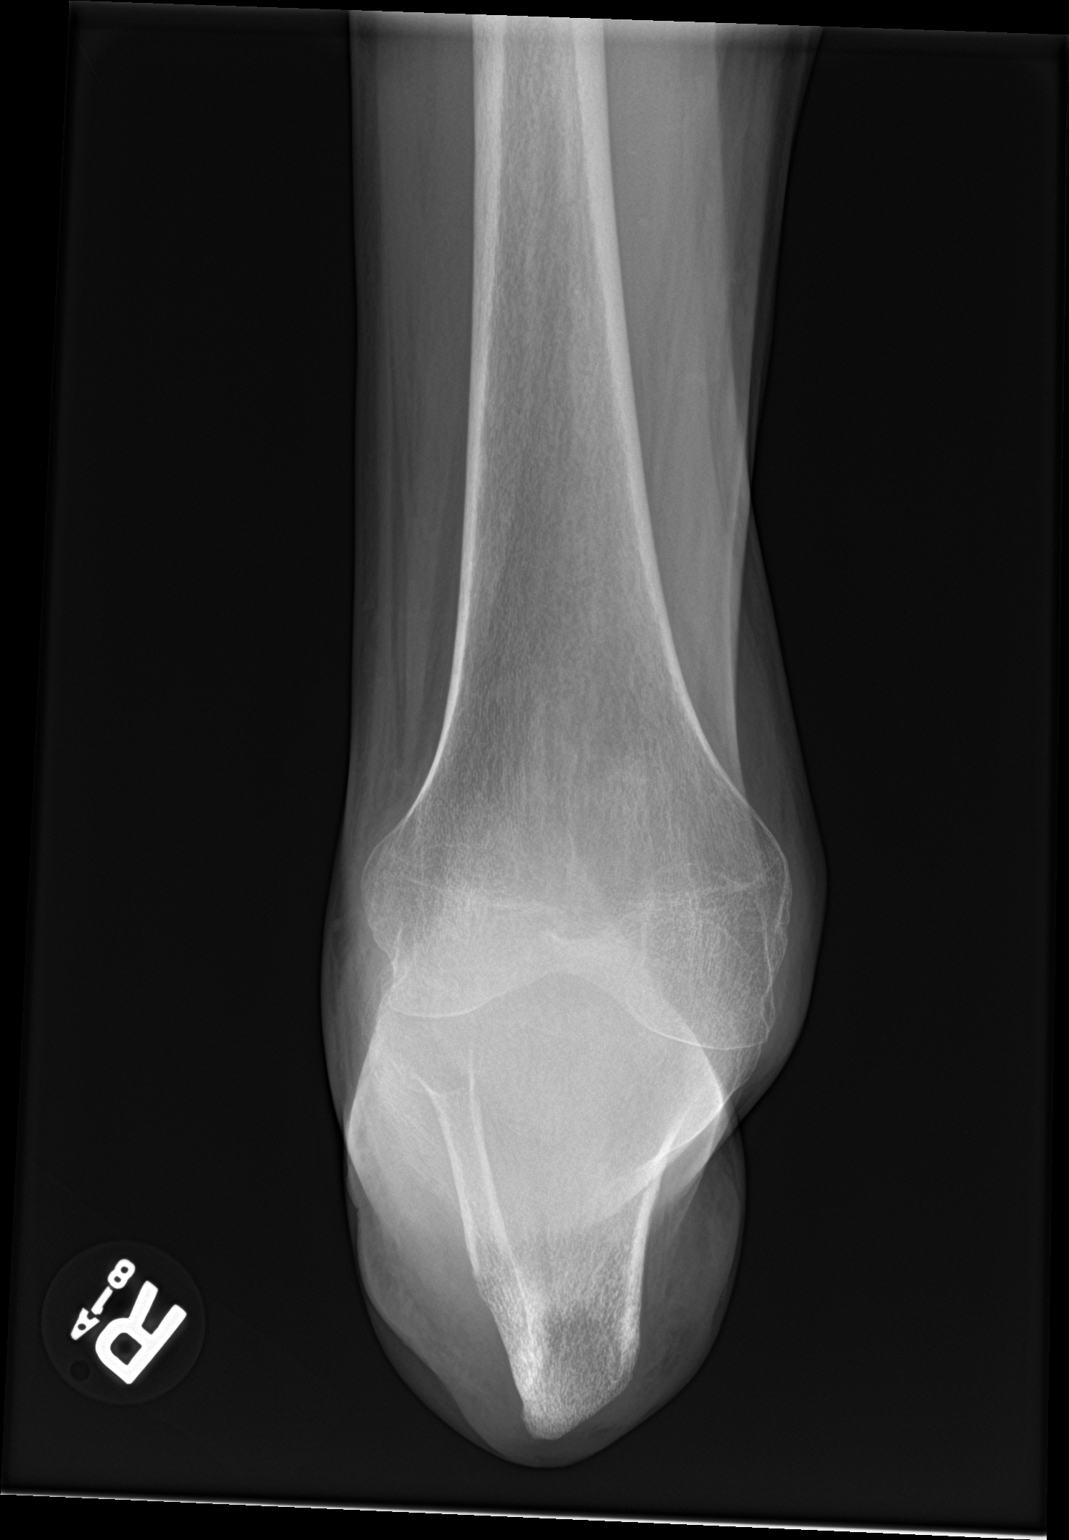

[3 of 3 positions shown; findings below may reference images not displayed]

FINDINGS: No evidence of fracture, dislocation, or joint effusion. No evidence
of arthropathy. Status post below-knee amputation. Soft tissues are
unremarkable.
IMPRESSION: No significant abnormality seen in the right knee.

## 2019-12-04 ENCOUNTER — Encounter: Payer: Medicaid Other | Attending: Physical Medicine & Rehabilitation | Admitting: Physical Medicine & Rehabilitation

## 2019-12-08 ENCOUNTER — Encounter: Payer: Self-pay | Admitting: Nurse Practitioner

## 2019-12-08 ENCOUNTER — Ambulatory Visit (INDEPENDENT_AMBULATORY_CARE_PROVIDER_SITE_OTHER): Payer: Medicaid Other | Admitting: Nurse Practitioner

## 2019-12-08 ENCOUNTER — Other Ambulatory Visit: Payer: Self-pay

## 2019-12-08 VITALS — BP 102/82 | HR 83 | Temp 98.4°F | Resp 18

## 2019-12-08 DIAGNOSIS — T8789 Other complications of amputation stump: Secondary | ICD-10-CM

## 2019-12-08 DIAGNOSIS — M79609 Pain in unspecified limb: Secondary | ICD-10-CM

## 2019-12-08 MED ORDER — OXYCODONE HCL 5 MG PO TABS: 5.0000 mg | ORAL_TABLET | Freq: Four times a day (QID) | ORAL | 0 refills | Status: AC | PRN

## 2019-12-08 NOTE — Progress Notes (Signed)
Acute Office Visit  Subjective:    [name], female    DOB: 1960-10-21, 59 y.o.   MRN: [id number]  Chief Complaint  Patient presents with  . Pain    pain in stump asking for pain medication     HPI Patient is in today for pain.  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   She rates her pain 8/10. She has not had any medication in the last 3 weeks. She is going to [place] because her mother died . She has bilateral stump pain. She takes her gabapentin . She has chest pain a few days ago because of stress.  She denies headache, dizziness, visual changes, shortness of breath, nausea, vomiting or any edema.      Past Medical History:  Diagnosis Date  . CAP (community acquired pneumonia) 03/2014 X 2  . Cocaine abuse ([place])    ongoing with resultant vaculitis.  . Depression   . Headache    "weekly" (07/29/2016)  . Hypertension   . Inflammatory arthritis   . Migraines    "probably 5-6/yr" (07/29/2016)  . Normocytic anemia    BL Hgb 9.8-12. Last anemia panel 04/2010 - showing Fe 19, ferritin 101.  Pt on monthly B12 injections  . 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 Rheumatoid arthritis(714.0)    patient reported  . S/P bilateral below knee amputation ([place]) 04/13/2017  . 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 Followed by Dr. Louanne Skye    Past Surgical History:  Procedure Laterality Date  . AMPUTATION Left 05/22/2016   Procedure: AMPUTATION LEFT LONG FINGER;  Surgeon: Marybelle Killings, MD;  Location: [facility];  Service: Orthopedics;  Laterality: Left;  . AMPUTATION Bilateral  04/10/2017   Procedure: AMPUTATION BELOW KNEE;  Surgeon: Newt Minion, MD;  Location: [facility];  Service: Orthopedics;  Laterality: Bilateral;  . AMPUTATION Bilateral 02/06/2018   Procedure: AMPUTATION ABOVE KNEE;  Surgeon: Newt Minion, MD;  Location: [place];  Service: Orthopedics;  Laterality: Bilateral;  . HERNIA REPAIR     "stomach"  . I & D EXTREMITY Right 09/26/2015   Procedure: IRRIGATION AND DEBRIDEMENT LEG WOUND  VAC PLACEMENT.;  Surgeon: Loel Lofty Dillingham, DO;  Location: [facility];  Service: Plastics;  Laterality: Right;  . INCISION AND DRAINAGE OF WOUND Bilateral 10/20/2016   Procedure: IRRIGATION AND DEBRIDEMENT WOUND BILATERAL;  Surgeon: Edrick Kins, DPM;  Location: [facility];  Service: Podiatry;  Laterality: Bilateral;  . IRRIGATION AND DEBRIDEMENT ABSCESS Bilateral 09/26/2013   Procedure: DEBRIDEMENT ULCERS BILATERAL THIGHS;  Surgeon: Gwenyth Ober, MD;  Location: [facility];  Service: General;  Laterality: Bilateral;  . SKIN BIOPSY Bilateral    shin nodules  . STUMP REVISION Bilateral 11/08/2019   Procedure: REVISION BILATERAL ABOVE KNEE AMPUTATION;  Surgeon: Newt Minion, MD;  Location: [facility];  Service: Orthopedics;  Laterality: Bilateral;    Family History  Problem Relation Age of Onset  . Breast cancer Mother        Breast cancer  . Alcohol abuse Mother   . Colon cancer Maternal Aunt 21  . Alcohol abuse Father     Social History   Socioeconomic  History  . Marital status: Single    Spouse name: Not on file  . Number of children: 0  . Years of education: 11th grade  . Highest education level: Not on file  Occupational History  . Occupation: Disability    Comment: since 2011, due to her rheumatoid arthritis  Tobacco Use  . Smoking status: Current Every Day Smoker    Packs/day: 0.12    Years: 38.00    Pack years: 4.56    Types: Cigarettes  . Smokeless tobacco: Never Used  . Tobacco comment: 2 A DAY  Substance and Sexual Activity  . Alcohol use: No    Alcohol/week: 0.0  standard drinks  . Drug use: Yes    Types: "Crack" cocaine, Cocaine    Comment: 08/19/2019  . Sexual activity: Not Currently  Other Topics Concern  . Not on file  Social History Narrative   Unemployed:  cleaning in past   Living at [place]; has Medicaid.   crack/cocaine use; pt denies IVDU   tobacco:  1/2 ppd, trying to quit   alcohol:  none       Social Determinants of Health   Financial Resource Strain:   . Difficulty of Paying Living Expenses: Not on file  Food Insecurity:   . Worried About Charity fundraiser in the Last Year: Not on file  . Ran Out of Food in the Last Year: Not on file  Transportation Needs:   . Lack of Transportation (Medical): Not on file  . Lack of Transportation (Non-Medical): Not on file  Physical Activity:   . Days of Exercise per Week: Not on file  . Minutes of Exercise per Session: Not on file  Stress:   . Feeling of Stress : Not on file  Social Connections:   . Frequency of Communication with Friends and Family: Not on file  . Frequency of Social Gatherings with Friends and Family: Not on file  . Attends Religious Services: Not on file  . Active Member of Clubs or Organizations: Not on file  . Attends Archivist Meetings: Not on file  . Marital Status: Not on file  Intimate Partner Violence:   . Fear of Current or Ex-Partner: Not on file  . Emotionally Abused: Not on file  . Physically Abused: Not on file  . Sexually Abused: Not on file    Outpatient Medications Prior to Visit  Medication Sig Dispense Refill  . amLODipine (NORVASC) 5 MG tablet Take 1 tablet (5 mg total) by mouth daily. 30 tablet 0  . gabapentin (NEURONTIN) 300 MG capsule Take 1 capsule (300 mg total) by mouth 3 (three) times daily. 90 capsule 0  . losartan (COZAAR) 25 MG tablet Take 1 tablet (25 mg total) by mouth daily. 30 tablet 0  . magnesium oxide (MAG-OX) 400 (241.3 Mg) MG tablet Take 1 tablet (400 mg total) by mouth 2 (two) times daily. 60 tablet 0   . polyethylene glycol (MIRALAX / GLYCOLAX) 17 g packet Take 17 g by mouth daily. 30 each 0  . Multiple Vitamin (MULTIVITAMIN WITH MINERALS) TABS tablet Take 1 tablet by mouth daily. (Patient not taking: Reported on 12/08/2019)     No facility-administered medications prior to visit.    Allergies  Allergen Reactions  . Levamisole     Levamisole is a chemical adulterant of cocaine which can cause cutaneous necrosis It is used as an Health and safety inspector in veterinary medicine  . Acetaminophen Swelling and Other (See Comments)    "  Eyelid swelling" but apparently tolerates Percocet and Vicodin just fine...  . Haldol [Haloperidol Lactate]     Possible eye swelling was given together with Toradol unsure if caused the reaction  . Lisinopril     UNSPECIFIED REACTION   . Toradol [Ketorolac Tromethamine]     Eye swelling  . Tramadol Rash    Review of Systems  All other systems reviewed and are negative.      Objective:    Physical Exam Cardiovascular:     Rate and Rhythm: Normal rate.  Pulmonary:     Effort: Pulmonary effort is normal.  Musculoskeletal:     Cervical back: Normal range of motion.  Skin:    General: Skin is warm and dry.  Neurological:     Mental Status: She is alert and oriented to person, place, and time.  Psychiatric:        Mood and Affect: Mood normal.        Behavior: Behavior normal.        Thought Content: Thought content normal.        Judgment: Judgment normal.     BP 102/82 (BP Location: Right Arm, Patient Position: Sitting, Cuff Size: Normal) Comment: manually  Pulse 83   Temp 98.4 F (36.9 C) (Oral)   Resp 18   LMP  (LMP Unknown)   SpO2 98%  Wt Readings from Last 3 Encounters:  11/08/19 89 lb 4.6 oz (40.5 kg)  09/28/19 100 lb 1.4 oz (45.4 kg)  09/08/19 100 lb 1.4 oz (45.4 kg)    Health Maintenance Due  Topic Date Due  . COLONOSCOPY  04/17/2013  . PAP SMEAR-Modifier  09/18/2013  . MAMMOGRAM  04/19/2015  . INFLUENZA VACCINE  05/06/2019     There are no preventive care reminders to display for this patient.   Lab Results  Component Value Date   TSH 1.650 07/31/2018   Lab Results  Component Value Date   WBC 7.2 11/20/2019   HGB 9.2 (L) 11/20/2019   HCT 30.6 (L) 11/20/2019   MCV 84.8 11/20/2019   PLT 354 11/20/2019   Lab Results  Component Value Date   NA 136 11/20/2019   K 4.7 11/20/2019   CO2 22 11/20/2019   GLUCOSE 90 11/20/2019   BUN 15 11/20/2019   CREATININE 0.51 11/20/2019   BILITOT 1.3 (H) 11/16/2019   ALKPHOS 114 11/16/2019   AST 41 11/16/2019   ALT 47 (H) 11/16/2019   PROT 6.6 11/16/2019   ALBUMIN 2.7 (L) 11/16/2019   CALCIUM 9.0 11/20/2019   ANIONGAP 9 11/20/2019   Lab Results  Component Value Date   CHOL 164 05/20/2018   Lab Results  Component Value Date   HDL 40 (L) 05/20/2018   Lab Results  Component Value Date   LDLCALC 116 (H) 05/20/2018   Lab Results  Component Value Date   TRIG 40 05/20/2018   Lab Results  Component Value Date   CHOLHDL 4.1 05/20/2018   Lab Results  Component Value Date   HGBA1C 5.1 08/25/2019       Assessment & Plan:   Problem List Items Addressed This Visit      Unprioritized   Amputation stump pain ([place]) - Primary   Relevant Orders   Ambulatory referral to Pain Clinic       Meds ordered this encounter  Medications  . oxyCODONE (OXY IR/ROXICODONE) 5 MG immediate release tablet    Sig: Take 1 tablet (5 mg total) by mouth every 6 (six) hours  as needed for up to 8 days for severe pain. Must last 30 days.    Dispense:  30 tablet    Refill:  0    Order Specific Question:   Supervising Provider    Answer:   Tresa Garter [[id number]]     Vevelyn Francois, NP

## 2019-12-08 NOTE — Patient Instructions (Signed)
Phantom Limb Pain Phantom limb pain is pain in a body part that no longer exists. It usually happens in an arm or leg (extremity) after it has been surgically removed (amputated). Most cases of phantom limb pain are brief. However, it can last for years, and it may be severe and disabling. The exact mechanism of how phantom limb pain occurs is not known. The problem may start in a part of the brain that processes feelings and awareness (sensations) from the rest of the body (sensory cortex). When a body part is lost, the sensory cortex may not be able to handle the loss and may reorganize (rewire) itself to make up for the lost signals. What are the causes? This condition only happens in patients with amputations, but the cause is not known. It may be caused by:  Damaged nerve endings (peripheral nerves).  Scar tissue.  Rewiring of nerves in the brain or spine (central nerves). What are the signs or symptoms? Symptoms vary and are related to the lost limb or the remaining stump. Stump pain may be mistaken for phantom limb pain, or you may have both at the same time. Symptoms include:  Phantom pain. Pain often feels like the pain you had before the amputation. It usually comes and goes, and it gets better over time. The pain may feel like: ? Burning. ? Stabbing. ? Throbbing. ? Cramping. ? Prickling. ? Crushing. ? It is moving over time from the farthest part of the amputated limb (fingers or toes) up to the site of amputation, as if the limb is shrinking (telescoping).  Phantom sensation. This is a feeling other than pain, as if the limb is still part of the body. This may include sensations of: ? Movement. ? Shape or length. ? Position. Physical or emotional factors can trigger or worsen pain sensations. Those factors may include:  Weather changes.  Stress.  Strong emotions.  Certain positions or movements of the body.  Pressure on the affected area. How is this  diagnosed? Diagnosis is based on your history of amputation and symptoms that you have after surgery. Your health care provider may:  Do a physical exam.  Talk with you about your symptoms and past history of pain. You may have imaging tests to examine your stump, such as X-rays or CT scan. How is this treated? There are different therapies and medicines that may give you relief. Work with your health care provider until you have adequate relief. Treatment options may include:  Pain medicine. Medicine can be given for pain right after surgery (acute pain) and for pain that goes on for some time (chronic pain). Commonly used medicines include: ? Antidepressant medicine. ? Anticonvulsant medicine. ? Narcotics, analgesics, or anti-inflammatory medicine. ? Nerve blocks.  Techniques that help to retrain the brain and nervous system (movement representation techniques), such as: ? Looking at your unaffected limb in a mirror and thinking about painless movement of your extremity (mirror therapy). ? Thinking about moving your limbs without actual movement (motor therapy). ? Watching and sensing the movement of other people (action observation).  Relaxation techniques that use the mind and body to control pain. These often involve guided imagery, deep breathing, and muscle relaxation exercises.  Hypnosis and mental imagery. These techniques can help you focus or concentrate to regain control.  Biofeedback. This involves using monitors that alert you to changes in your breathing, heart rate, skin temperature, or muscle activity, and using relaxation techniques to reverse those changes. Biofeedback tells you  if the techniques you are using are effective.  Acupuncture. This involves inserting small needles into certain places on your skin to help relieve pain.  Sensory discrimination training. For this treatment, painless stimulation is applied to different parts of your stump and you describe what  you feel. This may help with nerve rewiring.  Physical therapy involving the stump, which may include: ? Exercise. This may be physical movement, or it may involve applying sound waves (ultrasound) or tapping (percussion therapy) to the stump. These exercises may help to heal and retrain tissue and nerves. ? Massage. Stump massage creates new sensations, breaks up scar tissue, and prepares the stump (desensitization) for an artificial limb (prosthesis). ? Heat or cold treatment. This can improve blood flow and reduce inflammation. ? Applying painless electrical pulses to the skin to prevent sensations of pain from reaching the brain (transcutaneous electrical nerve stimulation, TENS). Follow these instructions at home:   Take over-the-counter and prescription medicines only as told by your health care provider.  Do not drive or use heavy machinery while taking prescription pain medicine.  Do not use any products that contain nicotine or tobacco, such as cigarettes and e-cigarettes. If you need help quitting, ask your health care provider.  Join a support group. Express your feelings and talk with someone you trust.  Seek counseling or talk therapy with a mental health professional. This may be helpful if you are having trouble managing your emotions about losing an extremity.  Keep all follow-up visits as told by your health care providers and therapists. This is important. Contact a health care provider if:  You have a sore on your stump that does not get better with treatment.  Your pain does not improve with medicine or treatment. Get help right away if:  You have suicidal thoughts. If you ever feel like you may hurt yourself or others, or have thoughts about taking your own life, get help right away. You can go to your nearest emergency department or call:  Your local emergency services (911 in the [place]  A suicide crisis helpline, such as the [facility] at (779)389-7261. This is open 24 hours a day. Summary  Phantom limb pain is pain in a body part that no longer exists. It happens in an arm or leg (extremity) after it has been surgically removed (amputated).  Medicines or techniques that help to retrain the brain and nervous system (movement representation techniques) may help to relieve symptoms.  Physical therapy for phantom limb pain may involve exercise, massage, heat or cold therapy, or painless stimulation of the skin (transcutaneous electrical nerve stimulation, TENS). This information is not intended to replace advice given to you by your health care provider. Make sure you discuss any questions you have with your health care provider. Document Revised: 09/03/2017 Document Reviewed: 10/30/2016 Elsevier Patient Education  2020 [employer].

## 2020-01-04 IMAGING — CR DG KNEE COMPLETE 4+V*R*
4 series · 4 of 4 positions shown · non-contrast
Comparison: 12/17/2017

CLINICAL DATA: Nonhealing wound at left stump

EXAM:
RIGHT KNEE - COMPLETE 4+ VIEW

[x knee ap right (1 of 4)]
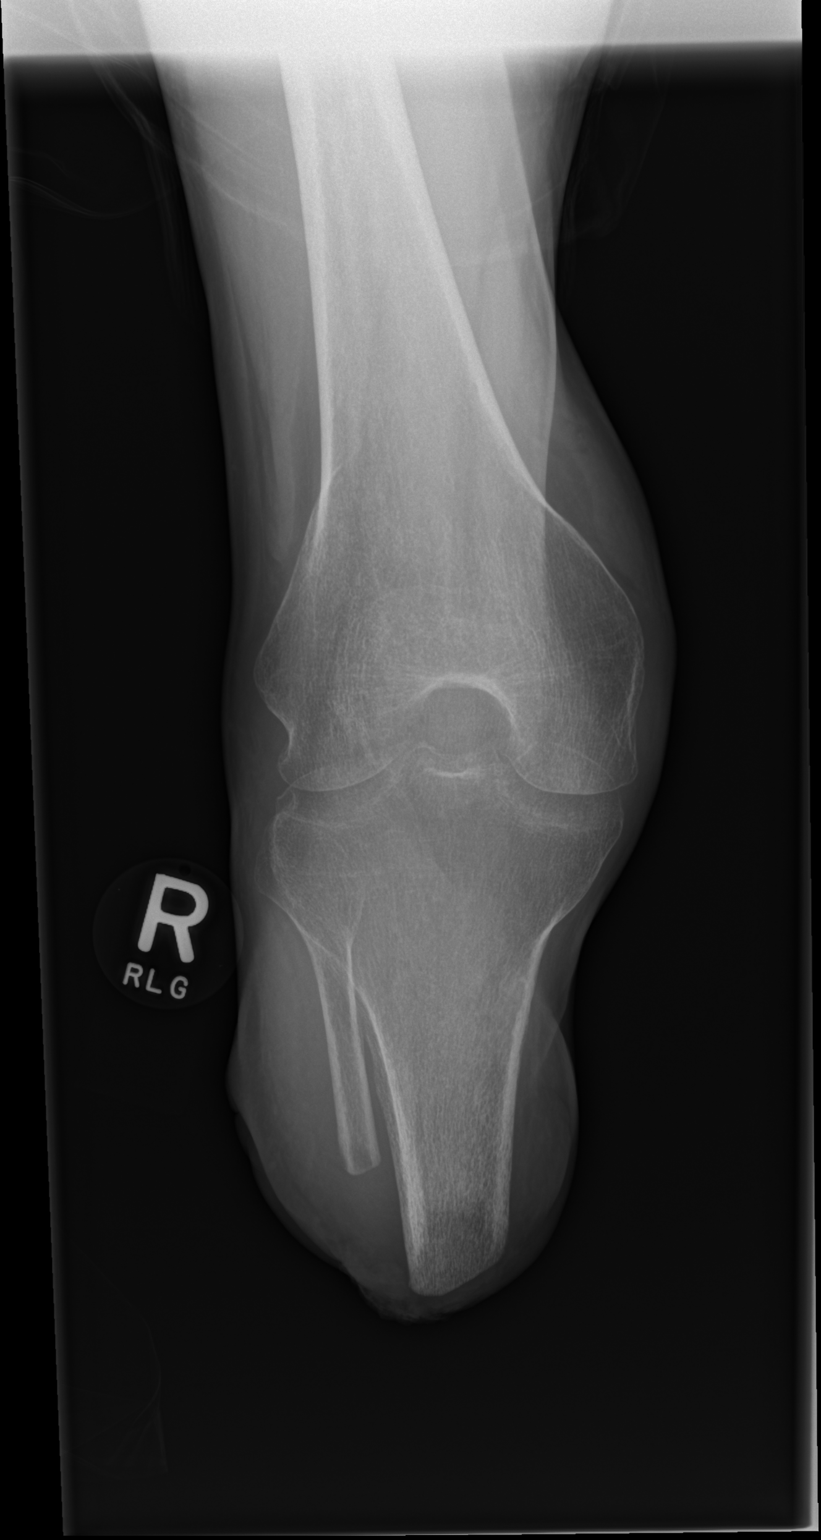

[x knee ap right (2 of 4)]
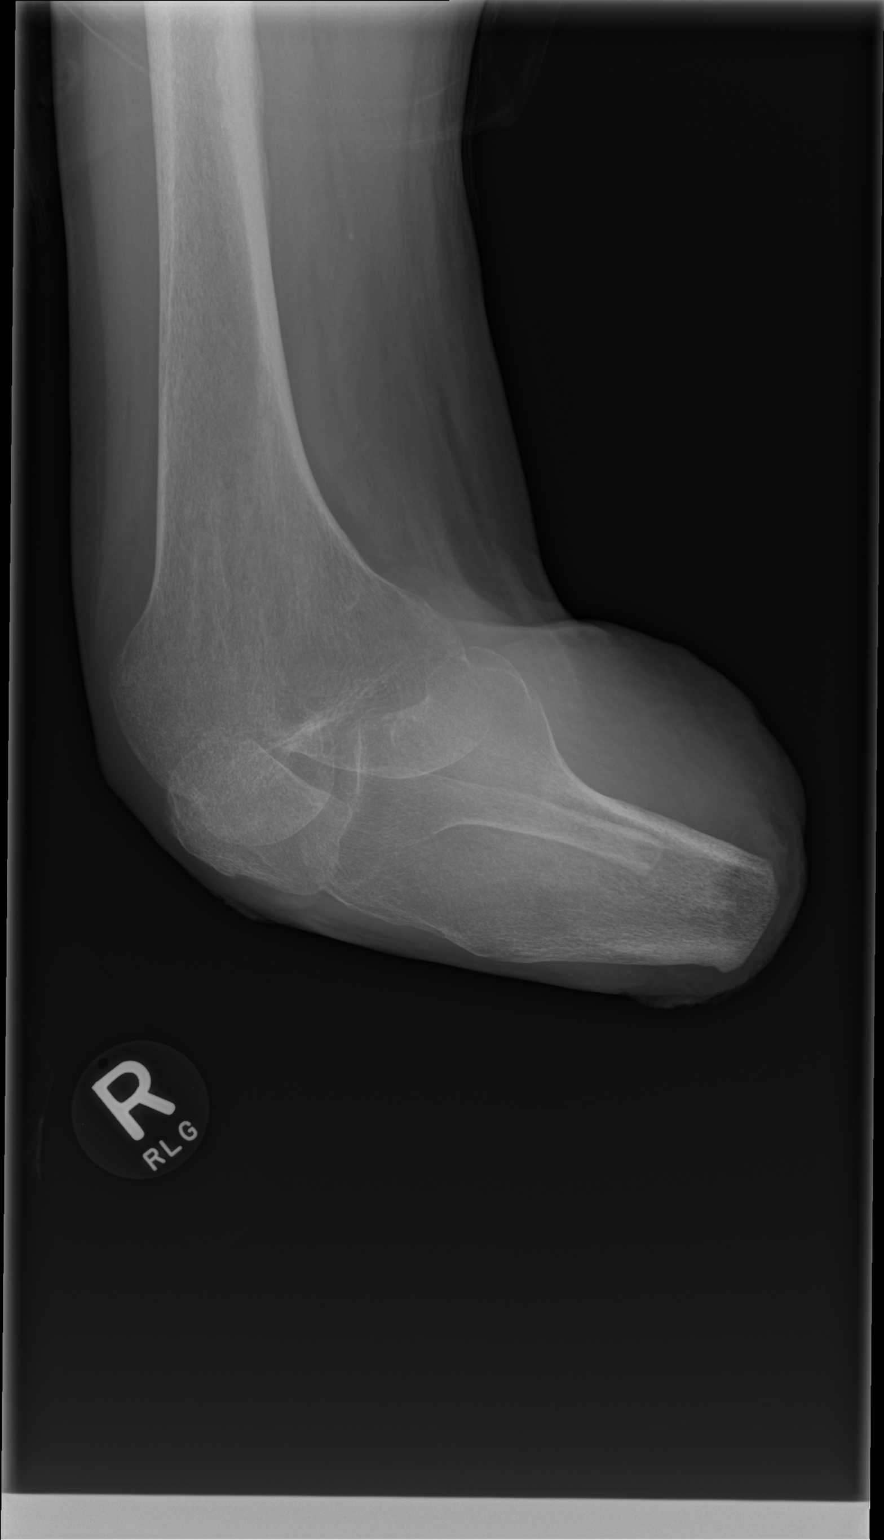

[x knee ap right (3 of 4)]
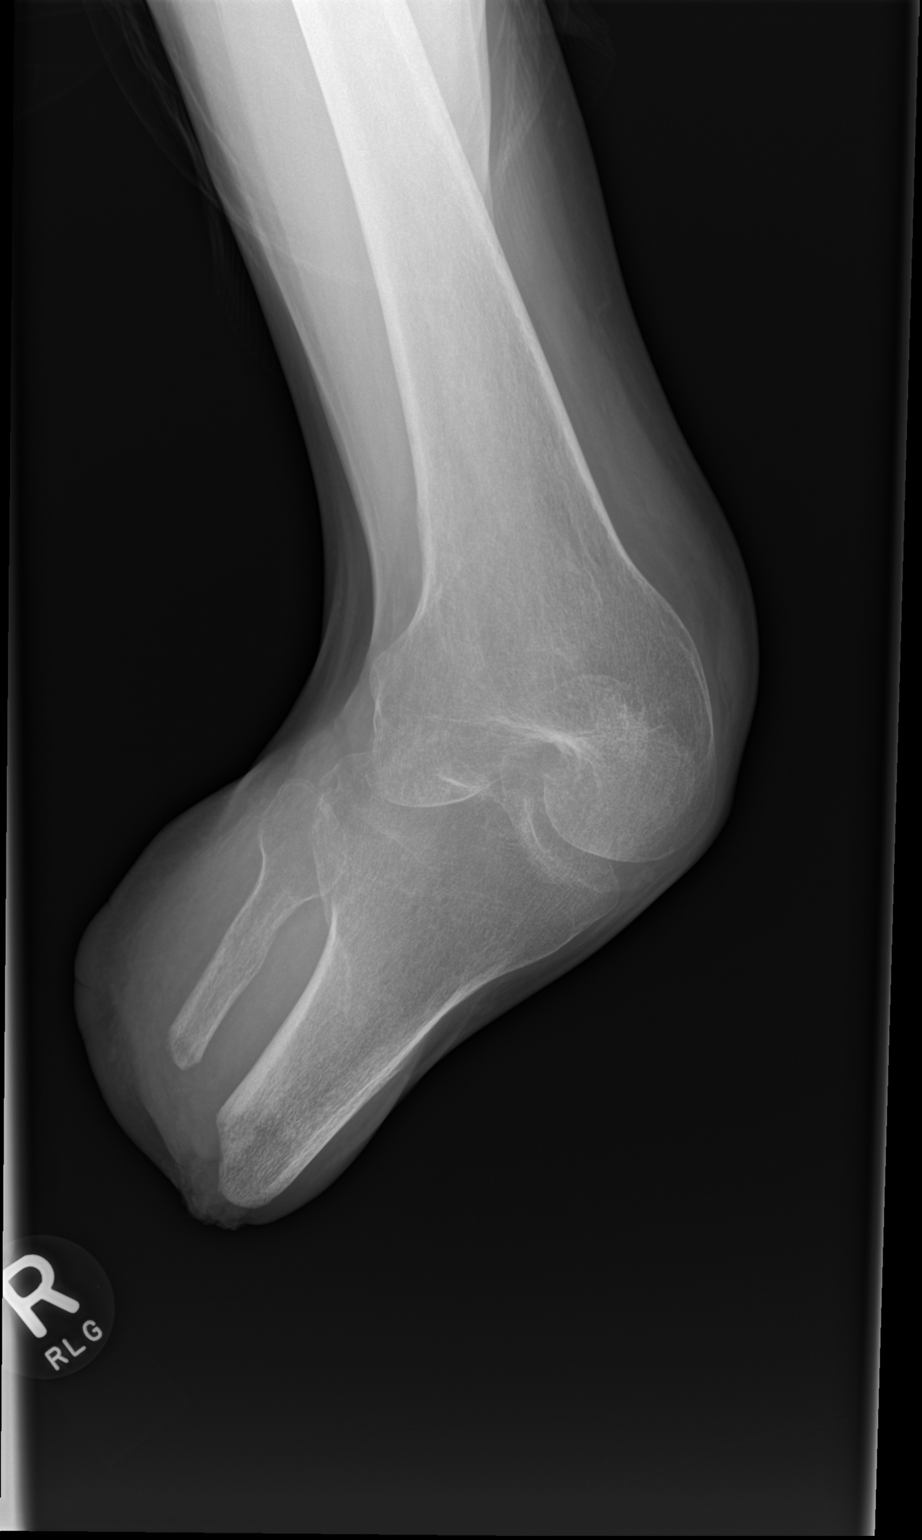

[x knee ap right (4 of 4)]
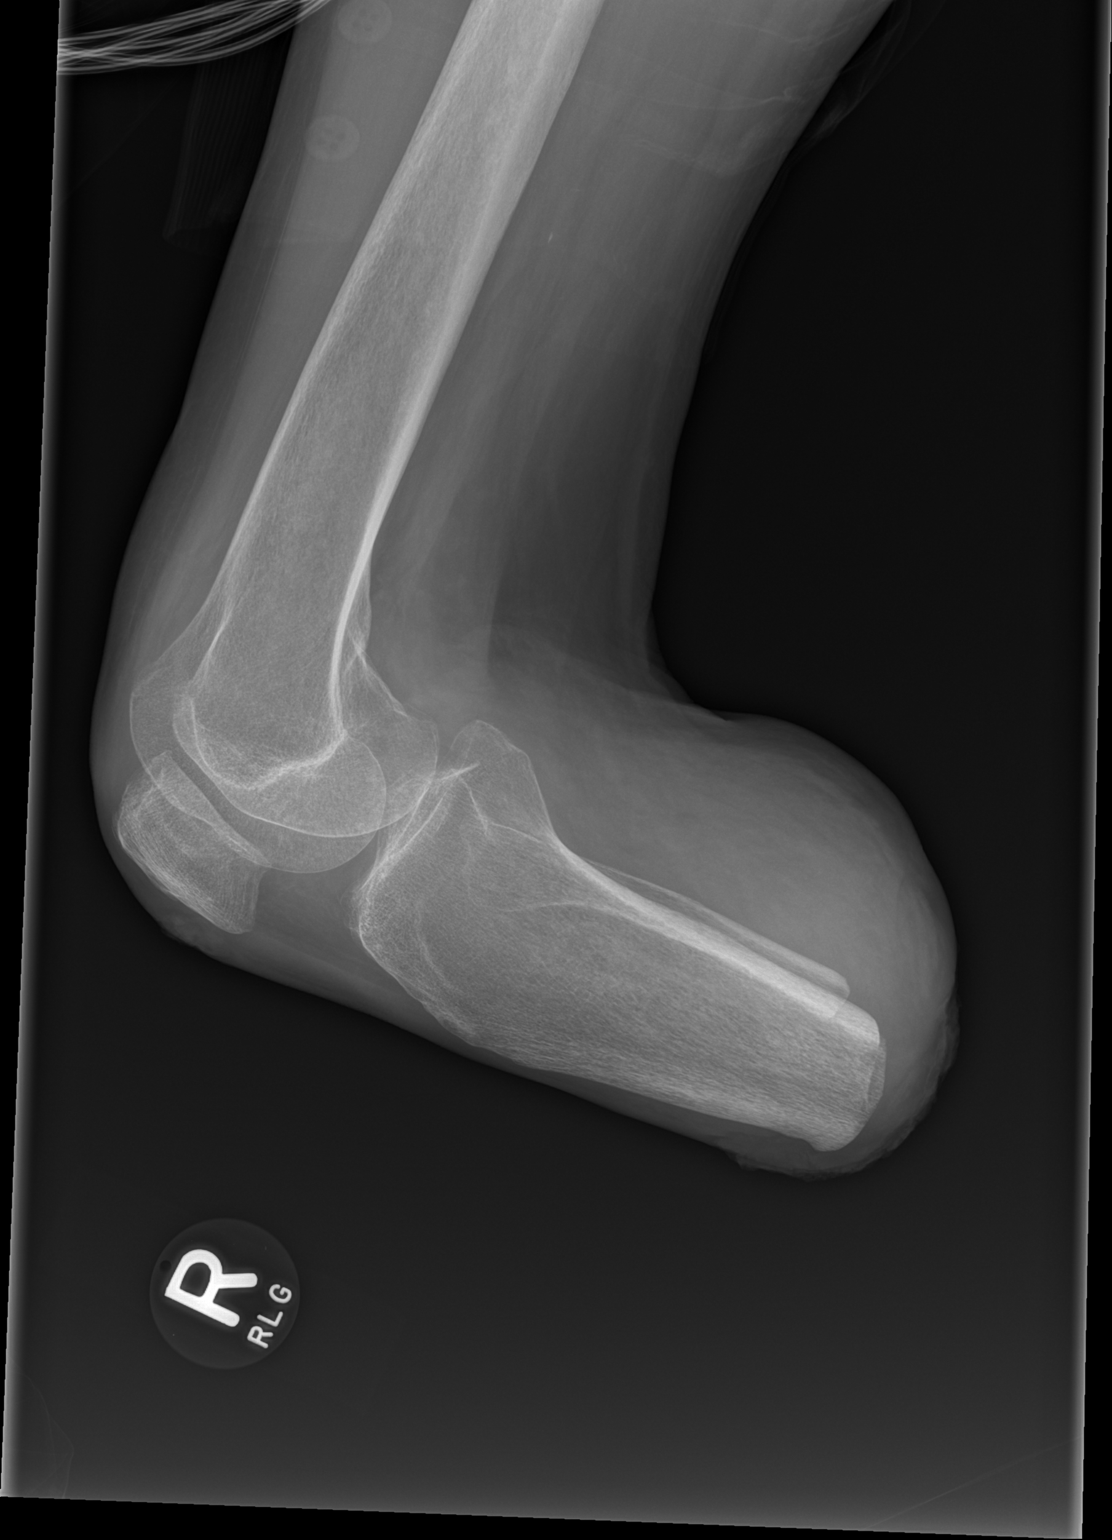

[4 of 4 positions shown; findings below may reference images not displayed]

FINDINGS: Prior below the knee amputation on the right. No bony changes to
suggest osteomyelitis. No soft tissue gas. No acute bony
abnormality.
IMPRESSION: Right BKA.  No radiographic changes of osteomyelitis.

## 2020-01-04 IMAGING — CR DG KNEE COMPLETE 4+V*L*
6 series · 6 of 6 positions shown · non-contrast
Comparison: 12/17/2017

CLINICAL DATA: Nonhealing wound at the left leg stump.

EXAM:
LEFT KNEE - COMPLETE 4+ VIEW

[x knee ap left (1 of 6)]
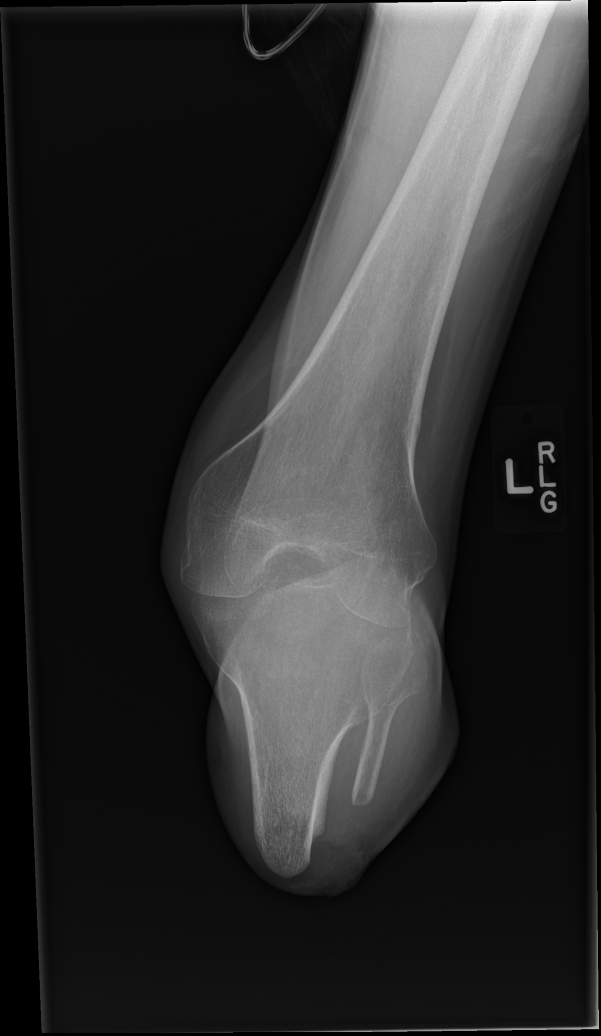

[x knee ap left (2 of 6)]
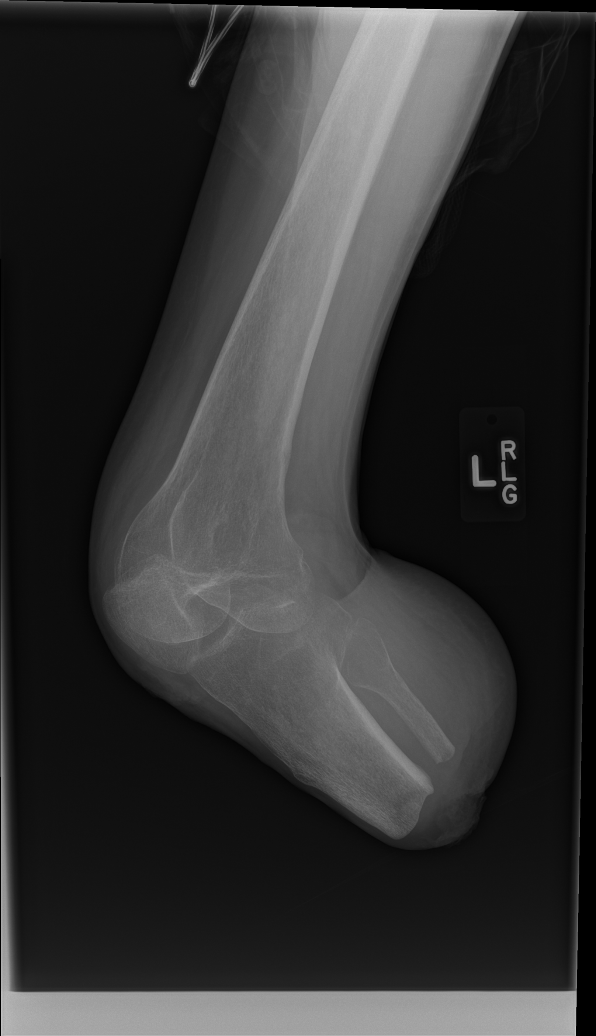

[x knee ap left (3 of 6)]
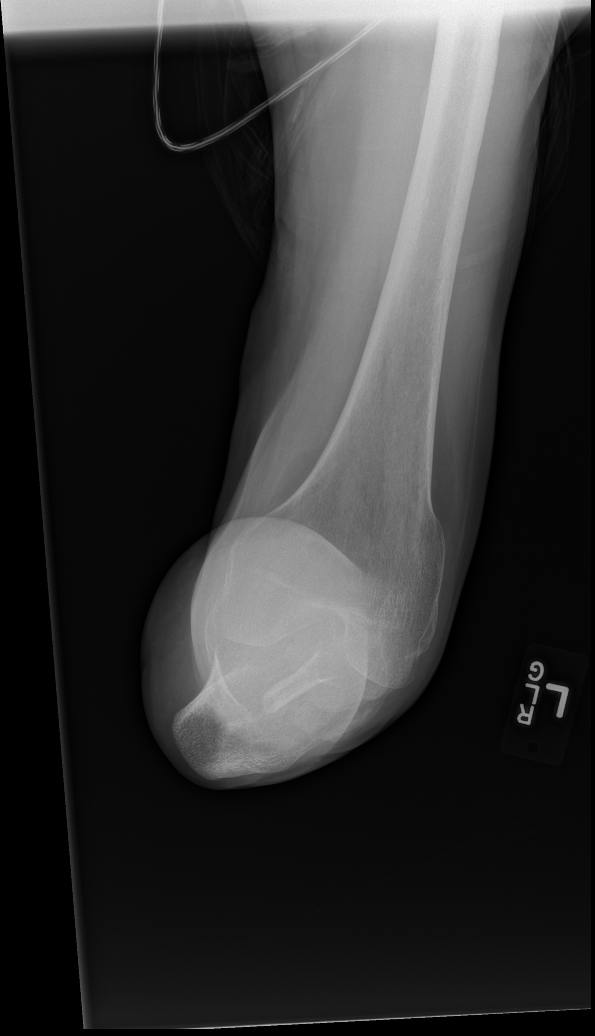

[x knee ap left (4 of 6)]
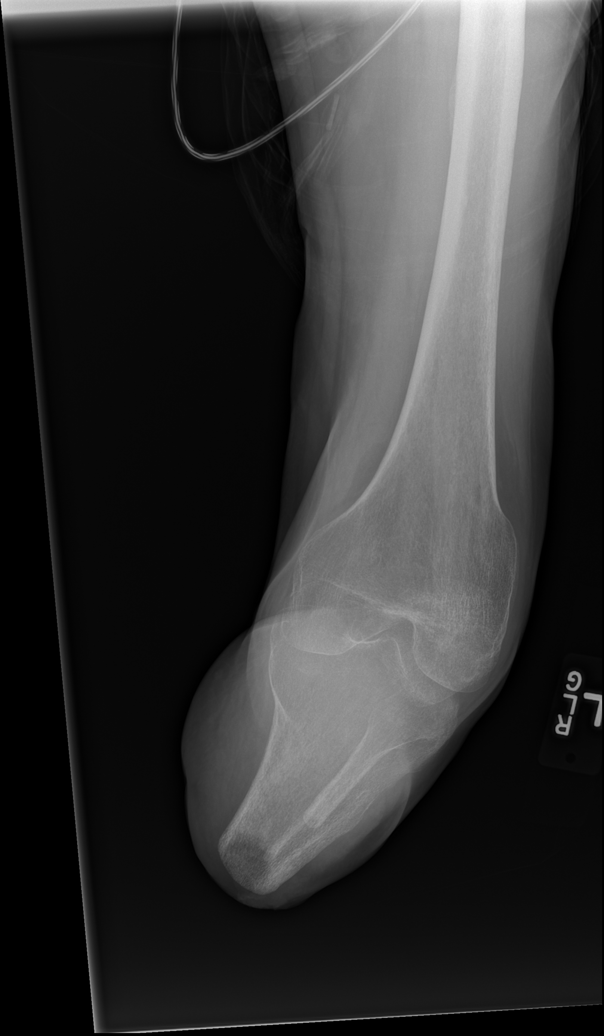

[x knee ap left (5 of 6)]
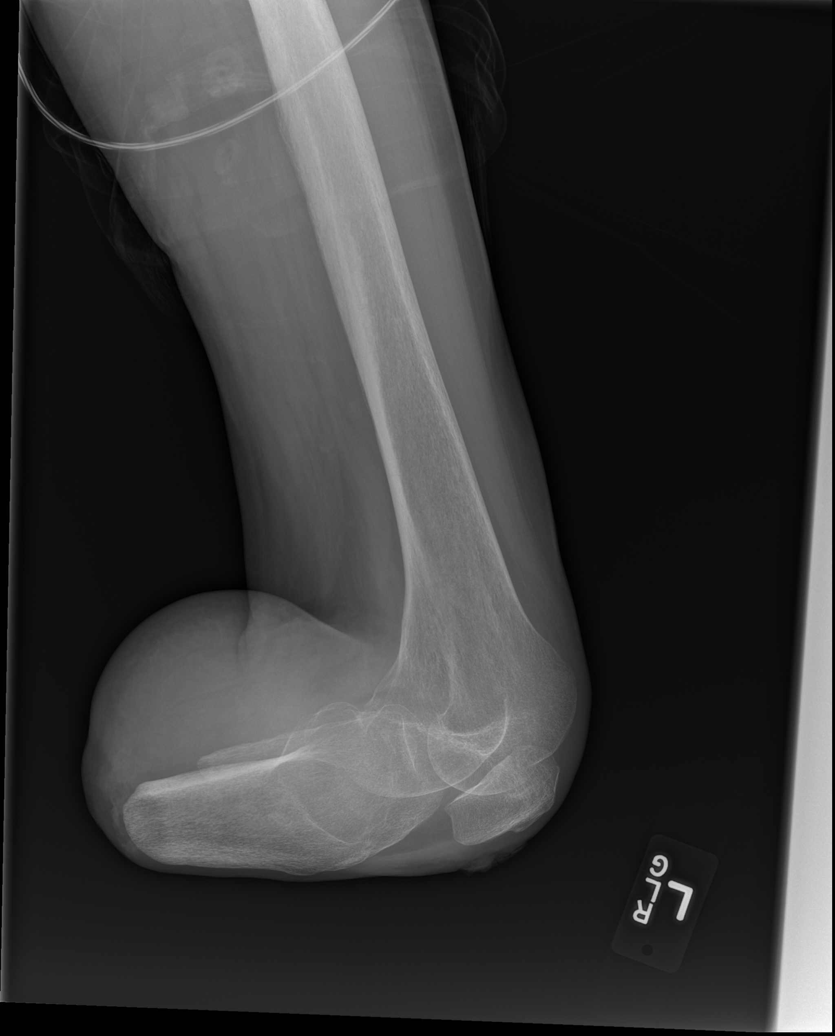

[x knee ap left (6 of 6)]
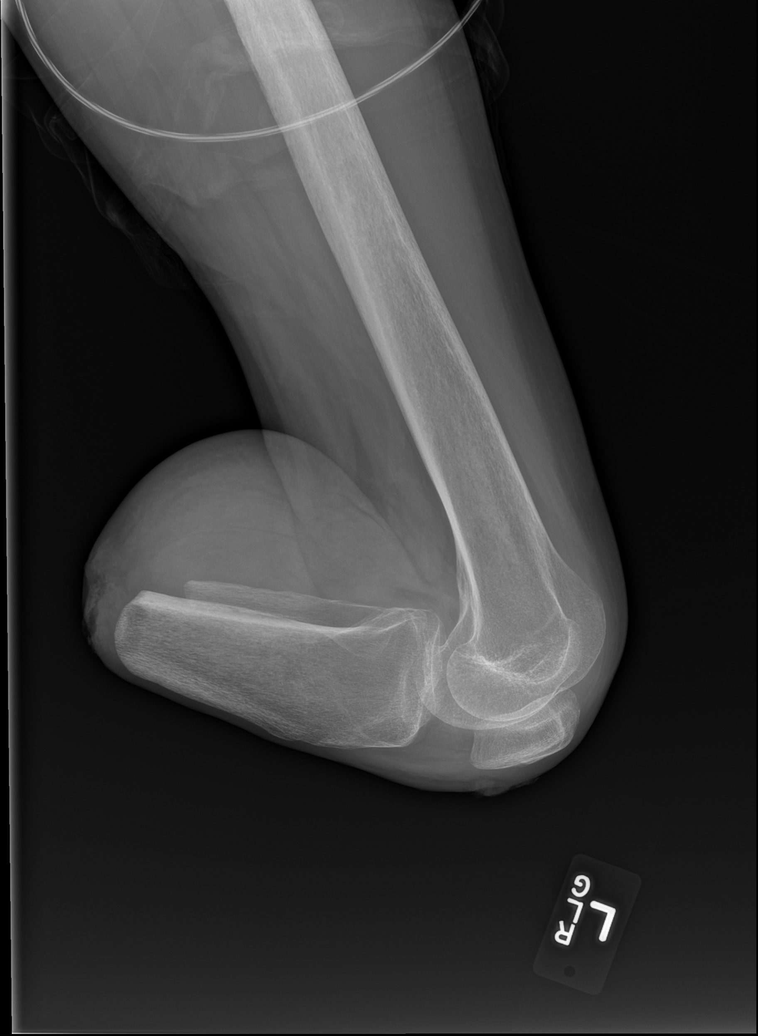

[6 of 6 positions shown; findings below may reference images not displayed]

FINDINGS: Patient has a below the knee amputation. Stable appearance of the
remaining tibia and fibula. There is no evidence for cortical
irregularity or periosteal reaction at the amputation site. There is
lucency and irregularity in the soft tissues at the stump. Left knee
is located.
IMPRESSION: No acute bone abnormality involving the amputation site. No
radiographic findings for osteomyelitis.

Irregularity involving the soft tissues compatible with the clinical
history.

## 2020-01-17 IMAGING — CR DG KNEE 1-2V*L*
3 series · 3 of 3 positions shown · non-contrast
Comparison: 01/20/2018

CLINICAL DATA: Knee pain.  Status post BKA.

EXAM:
LEFT KNEE - 1-2 VIEW

[x knee ap left (1 of 2)]
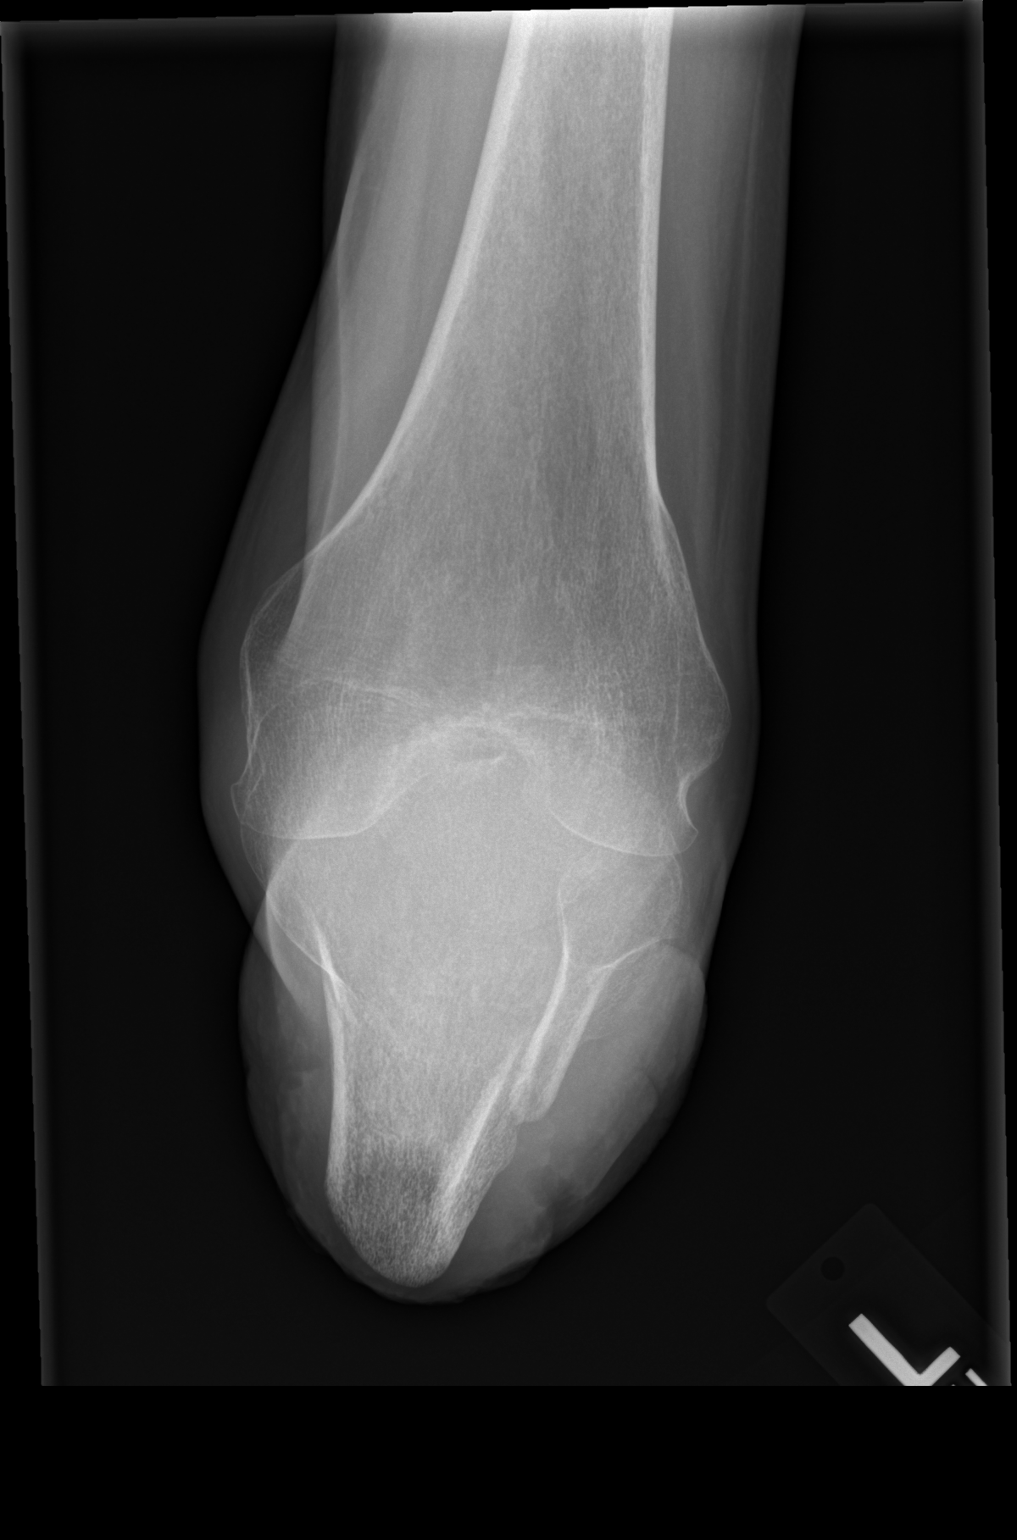

[x knee ap left (2 of 2)]
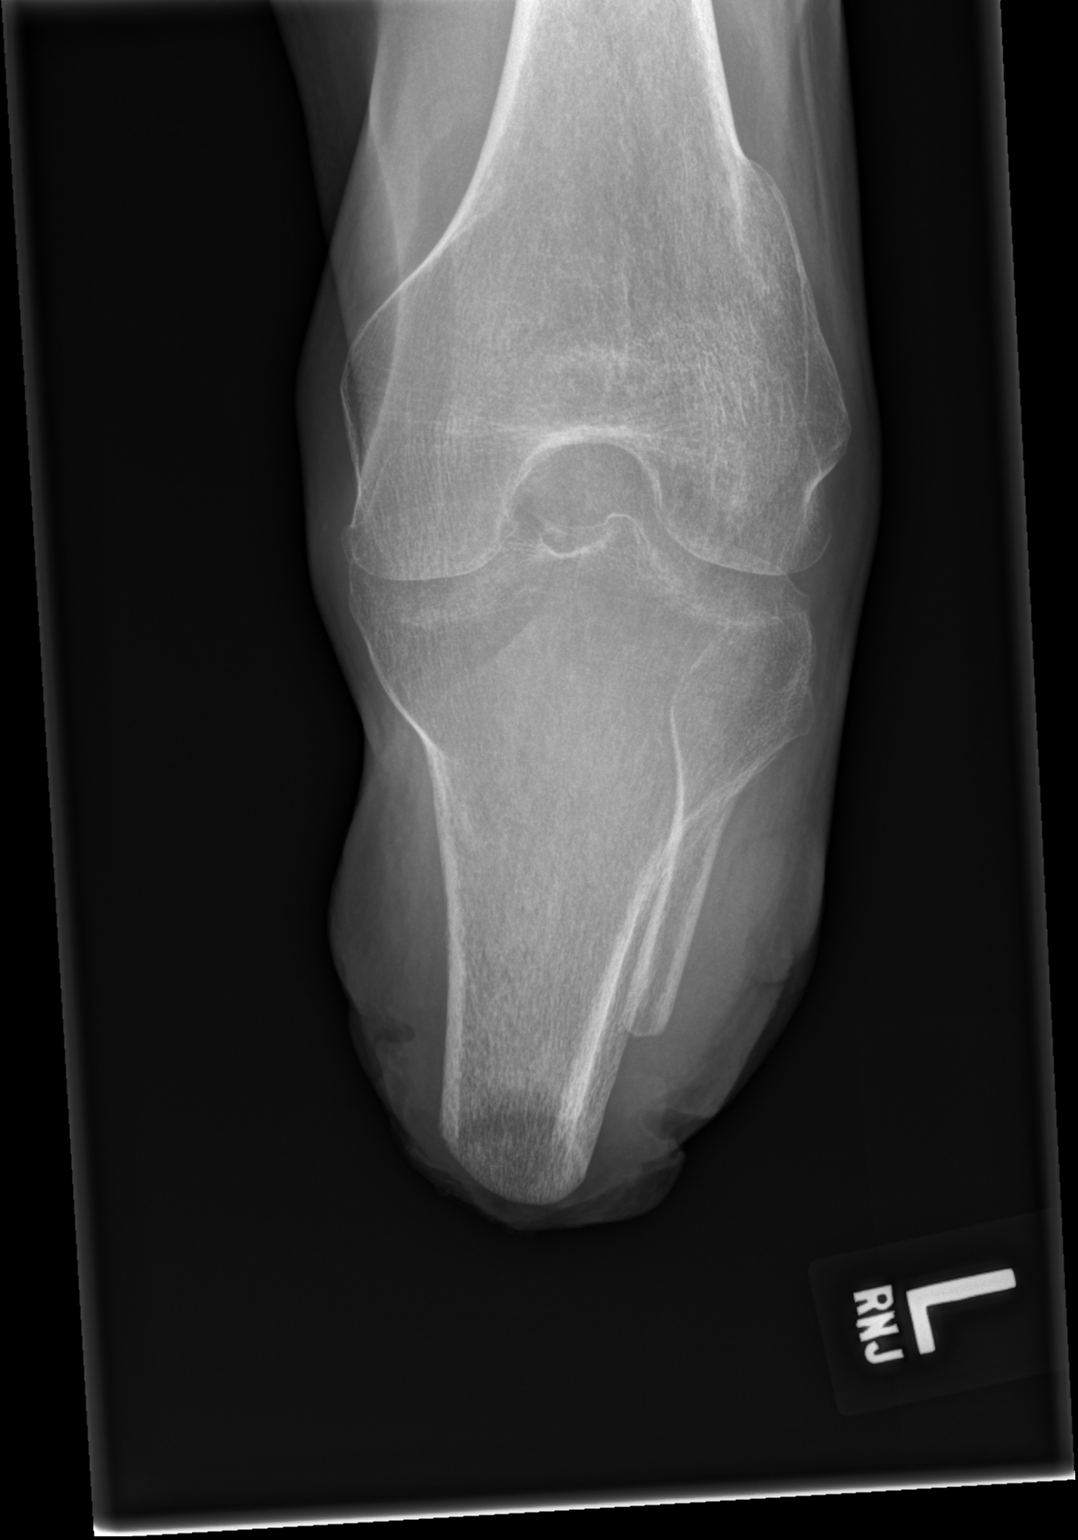

[x knee lat left]
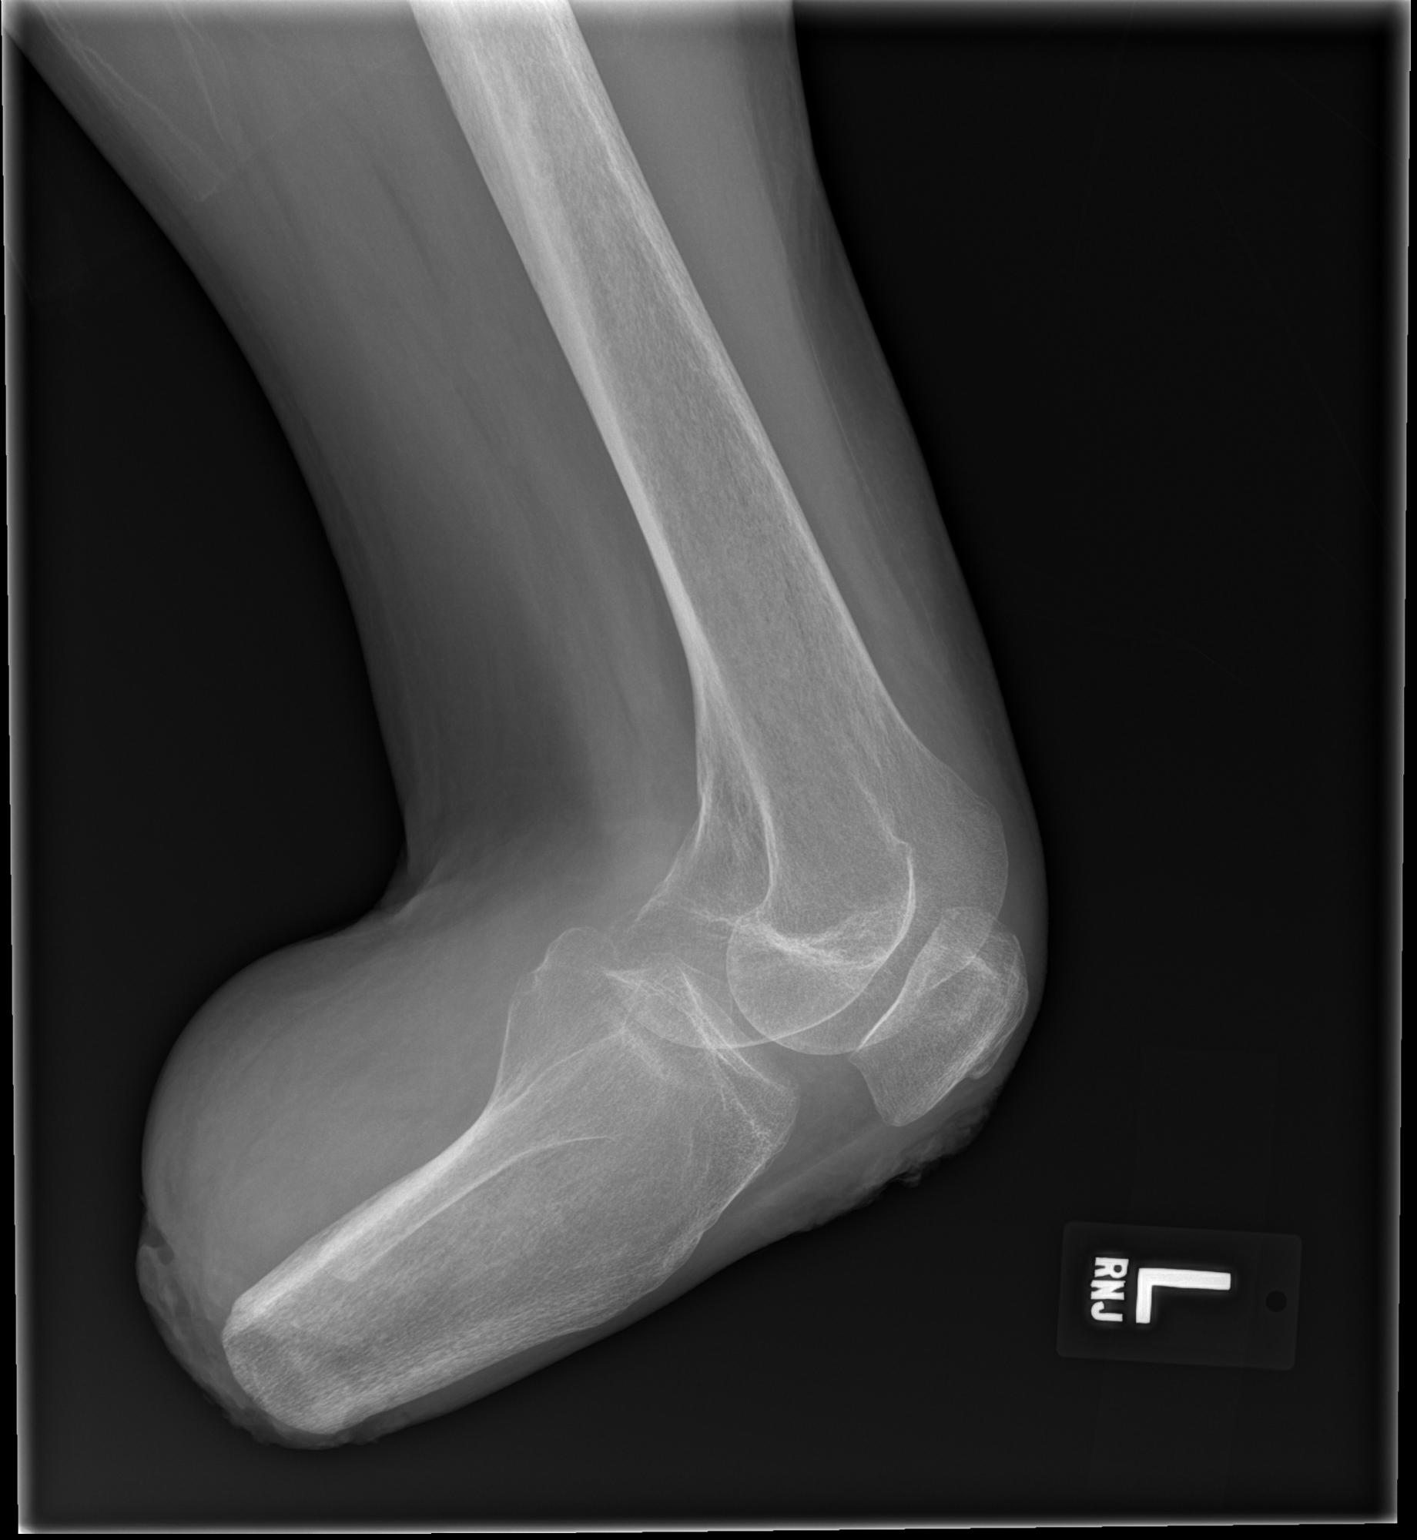

[3 of 3 positions shown; findings below may reference images not displayed]

FINDINGS: Bones are demineralized. No evidence for an acute fracture. No
subluxation or dislocation. No worrisome lytic or sclerotic osseous
abnormality. No gross joint effusion evident. There is some soft
tissue irregularity at the stump.
IMPRESSION: Osteopenia without acute bony abnormality.

Soft tissue irregularity at the stump may be postoperative scarring
although wound/ulcer could have this appearance.

## 2020-01-17 IMAGING — CR DG KNEE 1-2V*R*
3 series · 3 of 3 positions shown · non-contrast
Comparison: RIGHT knee radiograph January 20, 2018

CLINICAL DATA: Pain and necrosis at stump, bleeding.

EXAM:
RIGHT KNEE - 1-2 VIEW

[x knee ap right (1 of 2)]
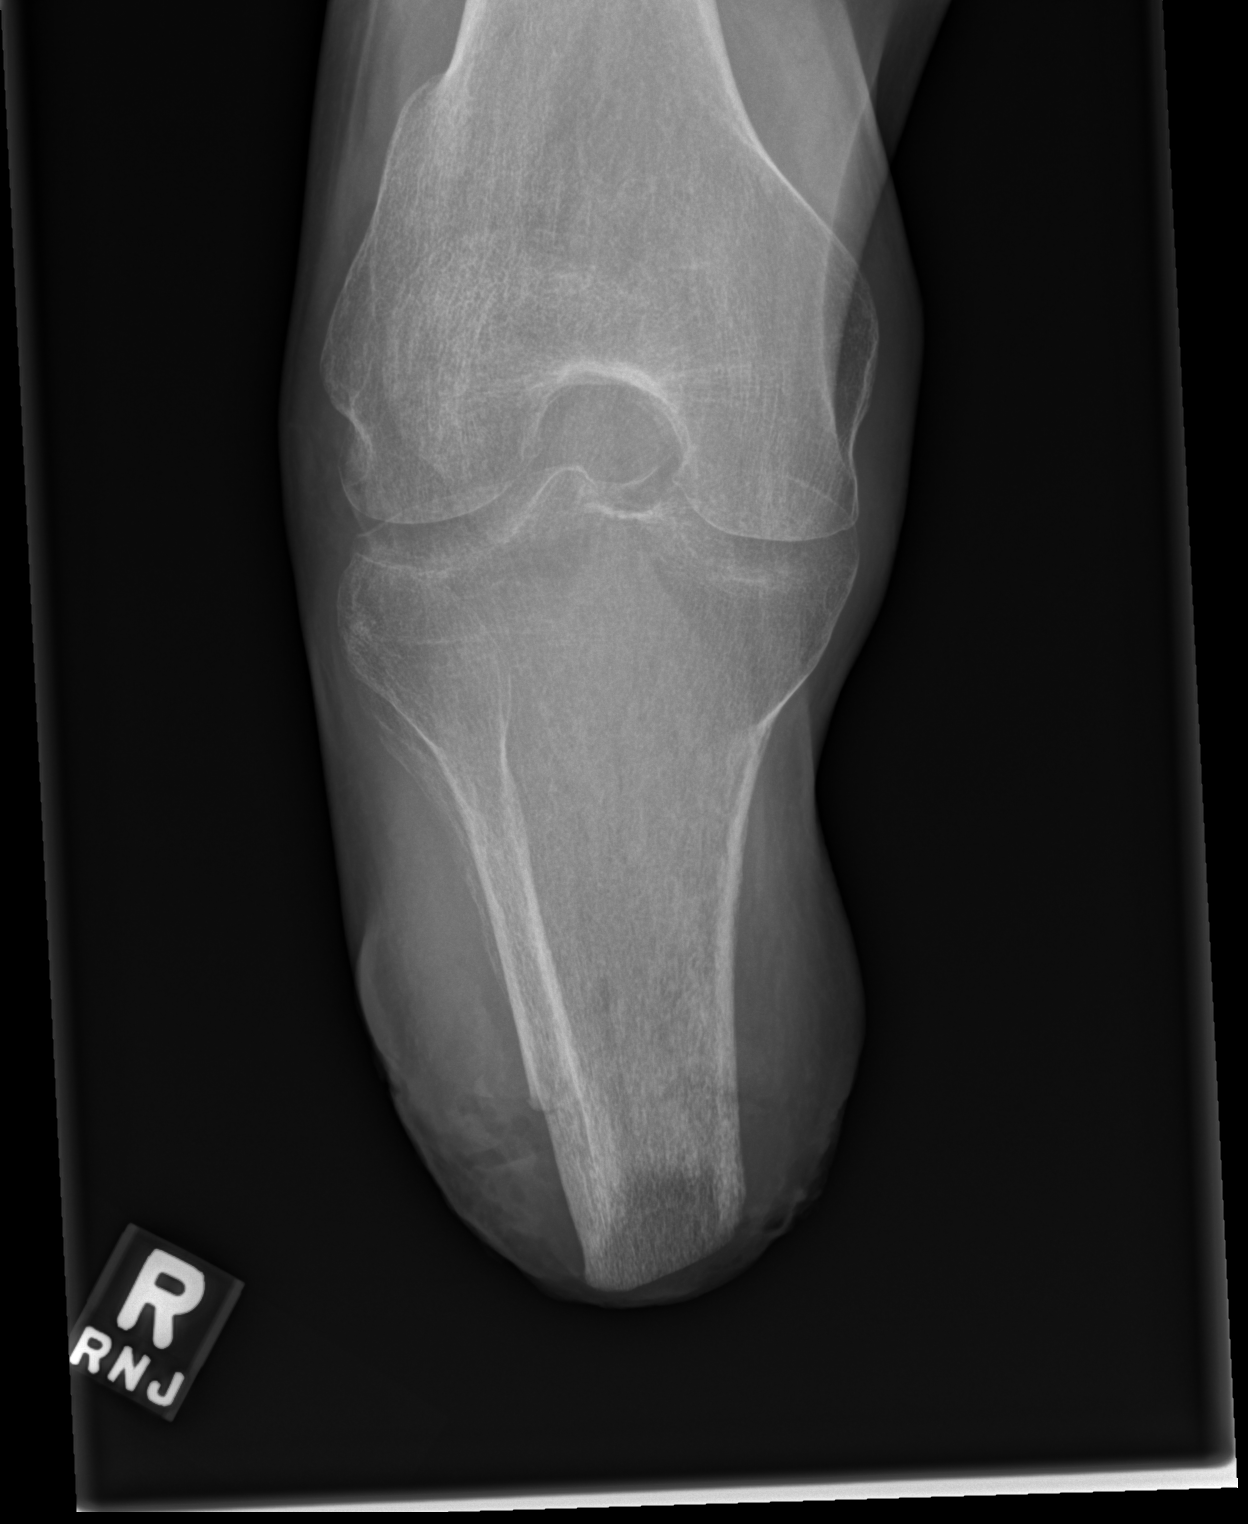

[x knee ap right (2 of 2)]
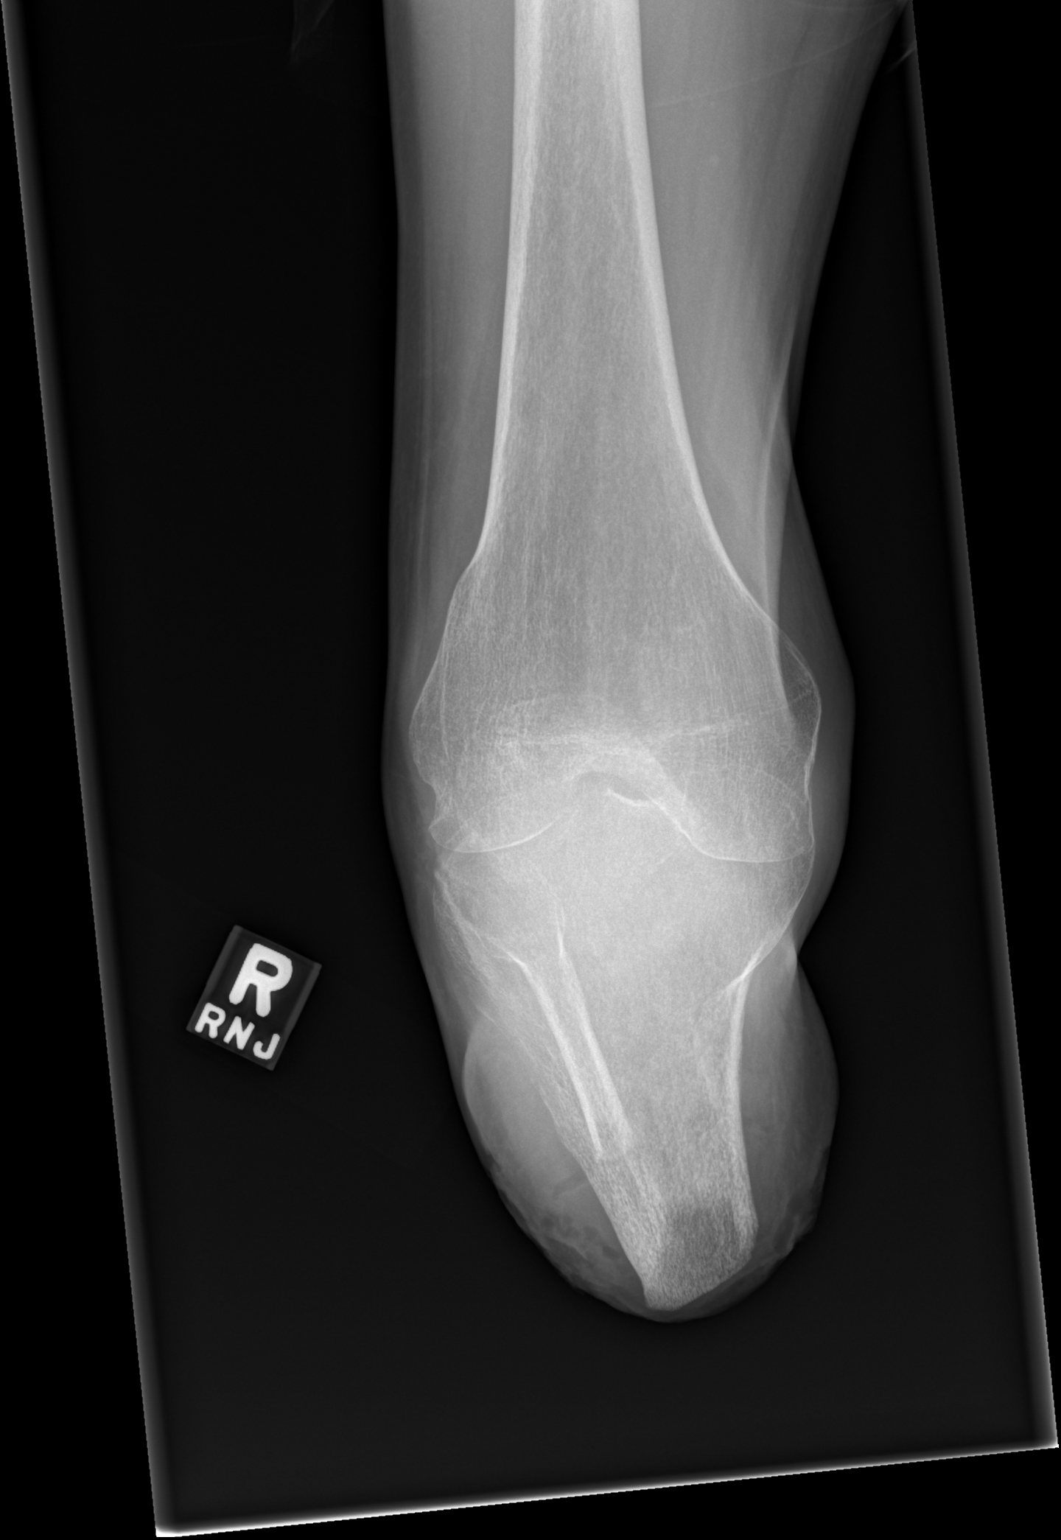

[x knee lat right]
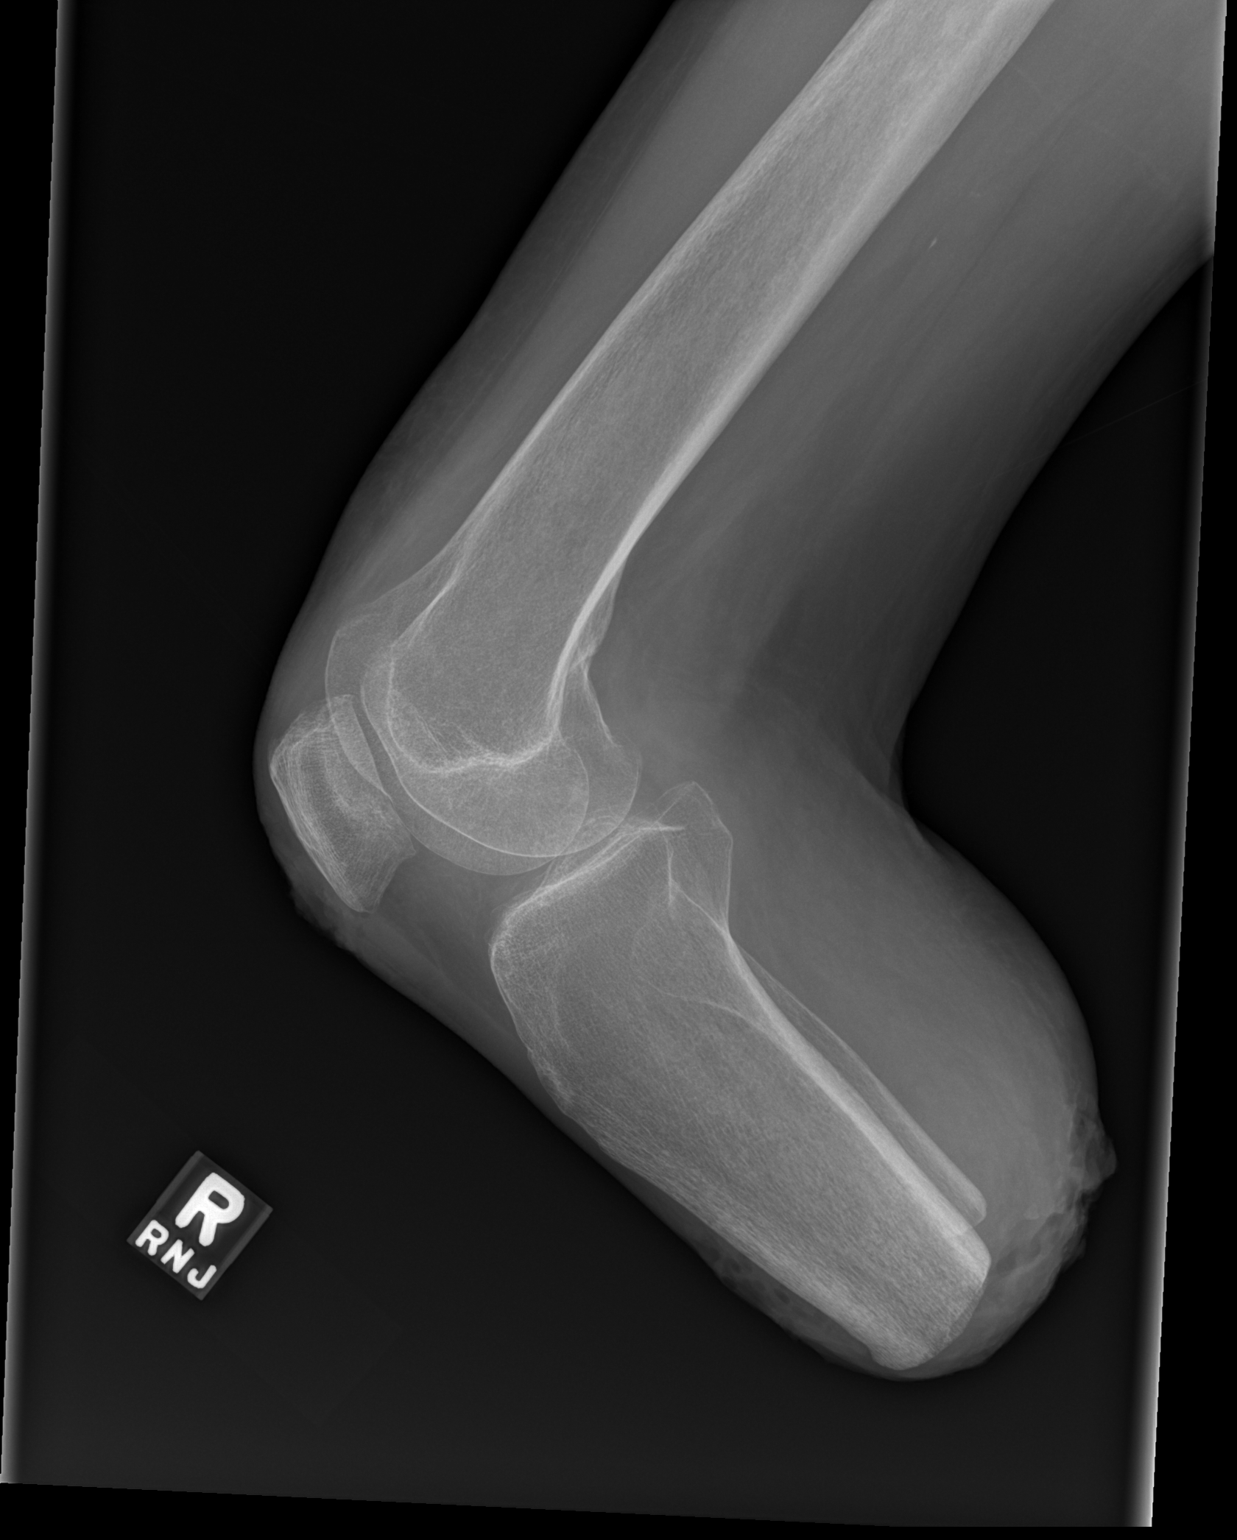

[3 of 3 positions shown; findings below may reference images not displayed]

FINDINGS: Status post below-knee amputation. No acute osseous process. No rib
fracture deformity or dislocation. No advanced degenerative change
for age. Increasing soft tissue swelling with superficial
irregularity and subcutaneous gas about the stump.
IMPRESSION: Soft tissue swelling with new subcutaneous gas seen with necrotizing
fasciitis or recent instrumentation.

No acute osseous process.

Acute findings discussed with and reconfirmed by Dr.VAVRINEC JABURKOVA on

## 2020-01-29 ENCOUNTER — Ambulatory Visit: Payer: Self-pay | Admitting: Family Medicine

## 2020-01-31 IMAGING — CT CT CERVICAL SPINE W/O CM
5 of 8 series · 12 of 33 positions shown, 13 images · non-contrast
Comparison: Head CT June 08, 2009

CLINICAL DATA: Pain following fall

EXAM:
CT HEAD WITHOUT CONTRAST
CT CERVICAL SPINE WITHOUT CONTRAST
TECHNIQUE: Multidetector CT imaging of the head and cervical spine was
performed following the standard protocol without intravenous
contrast. Multiplanar CT image reconstructions of the cervical spine
were also generated.

[Series 5: head bone · axial · 0.43mm/px · z∈[-157,-103]mm · 2 of 81 slices shown]
[im 27/81  bone]
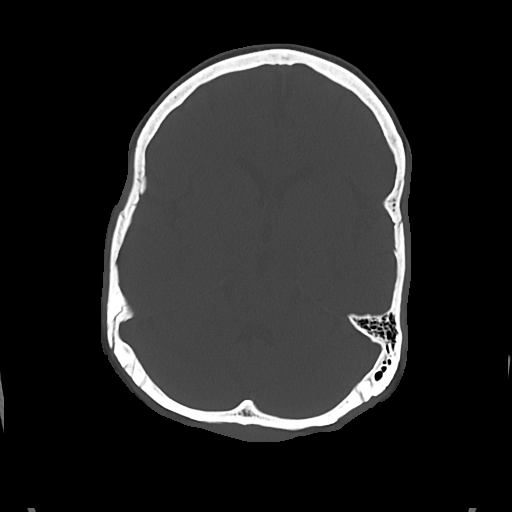
[im 54/81  bone]
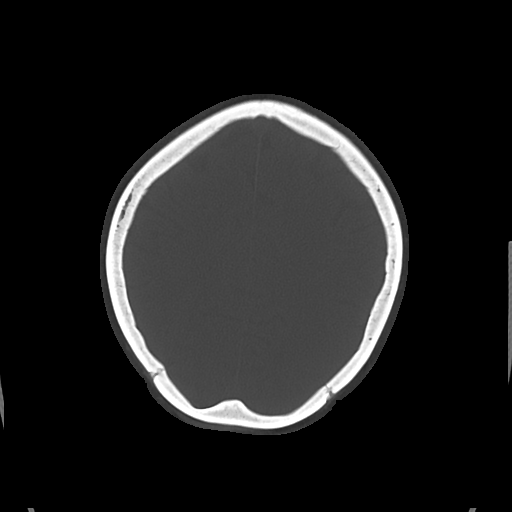

[Series 9: c spine soft · axial · 0.31mm/px · z∈[-270,-212]mm · 2 of 88 slices shown]
[im 30/88  soft-tissue]
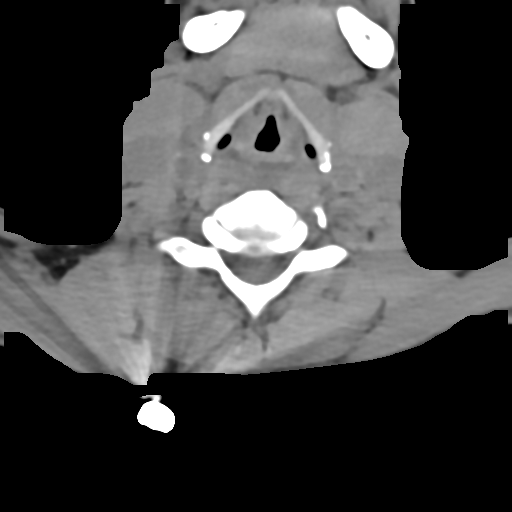
[im 59/88  soft-tissue]
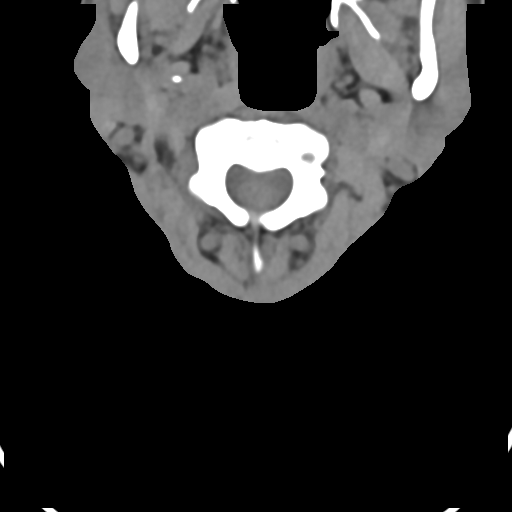

[Series 10: sag bone · sagittal · 0.23mm/px · 4 of 52 slices shown]
[im 11/52  bone]
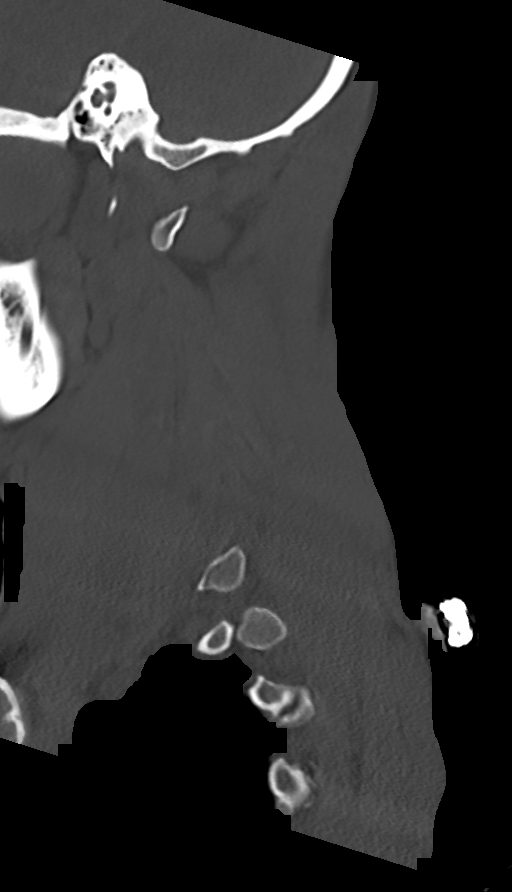
[im 21/52  bone]
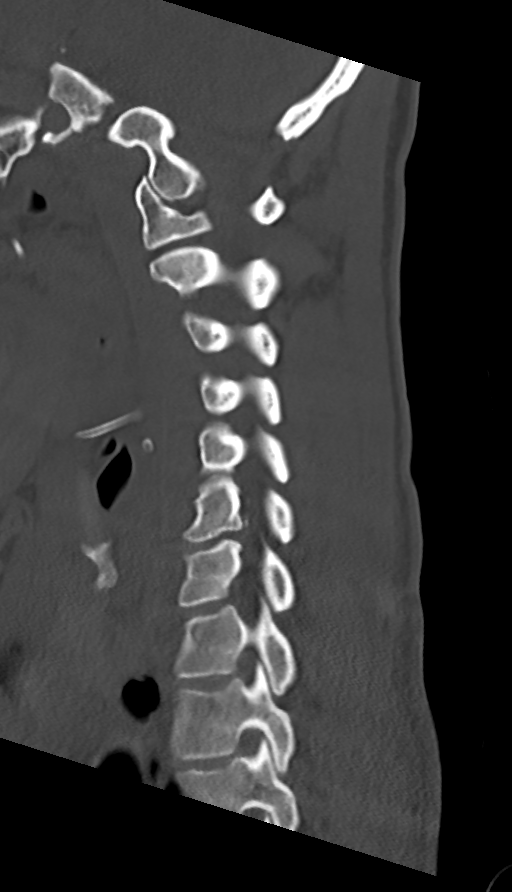
[im 31/52  bone]
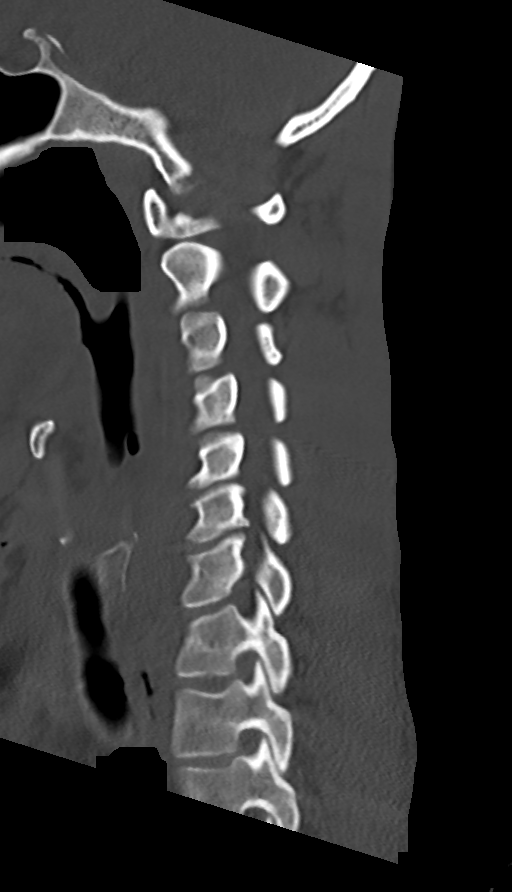
[im 41/52  bone]
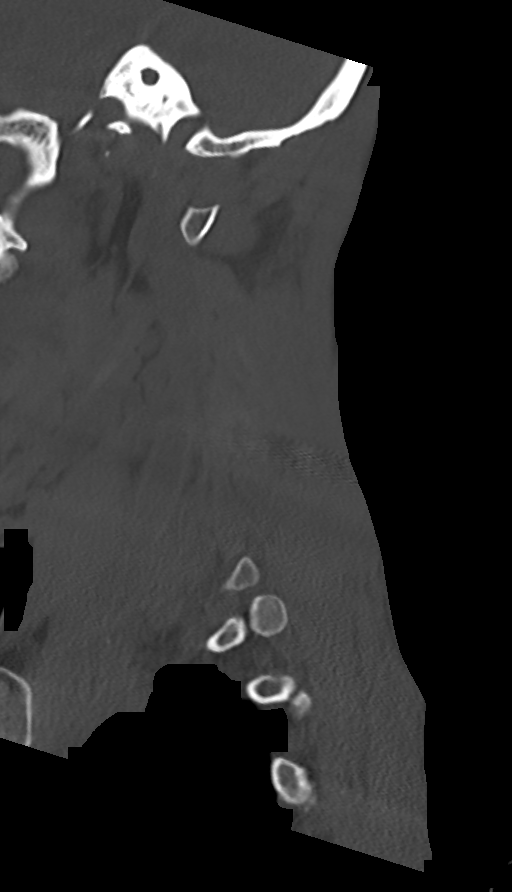

[Series 11: cor bone · coronal · 0.22mm/px · 2 of 61 slices shown]
[im 21/61  bone]
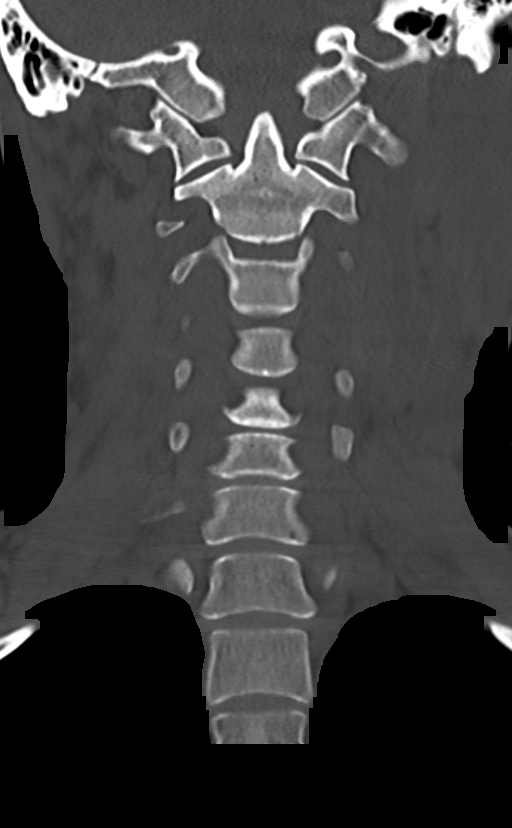
[im 41/61  bone]
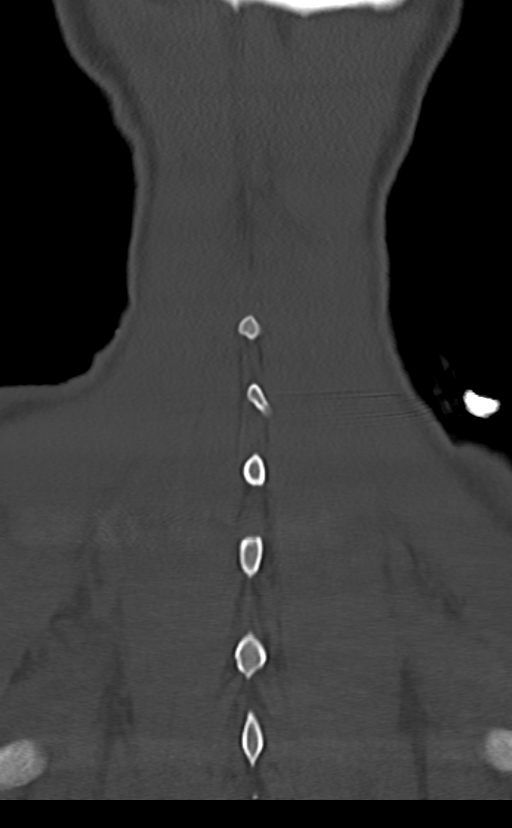

[Series 12: orthogonal axials · axial · 0.21mm/px · z∈[-286,-244]mm · 2 of 86 slices shown, 3 images]
[im 29/86  soft-tissue]
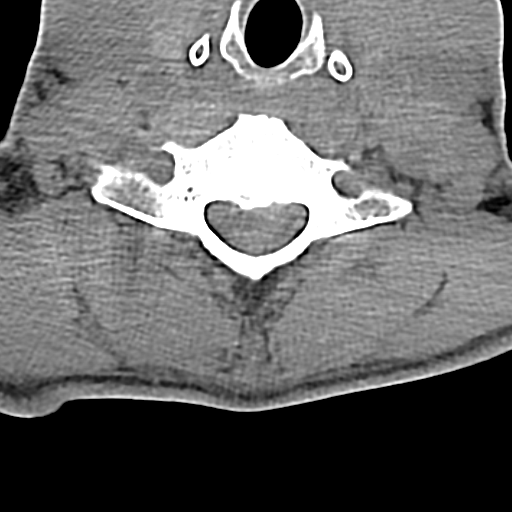
[im 29/86  bone]
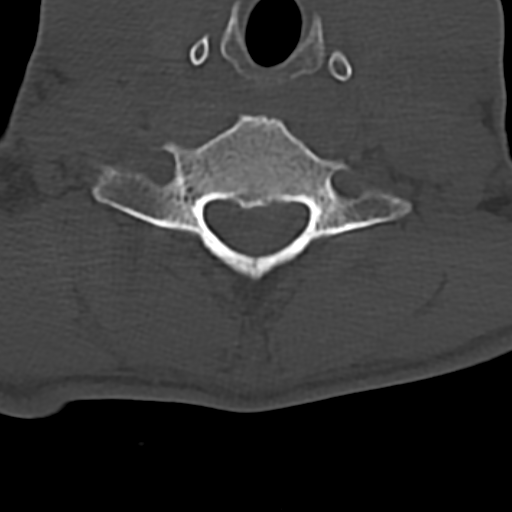
[im 57/86  bone]
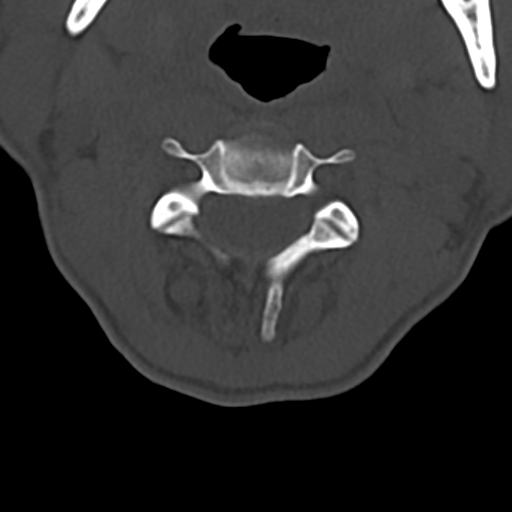

[12 of 33 positions shown; findings below may reference images not displayed]

FINDINGS: CT HEAD FINDINGS

Brain: The ventricles are normal in size and configuration. There is
no intracranial mass, hemorrhage, extra-axial fluid collection, or
midline shift. Gray-white compartments appear normal. No evident
acute infarct.

Vascular: There is no hyperdense vessel. There is calcification in
each carotid siphon region.

Skull: Bony calvarium appears intact. There is a right parietal
scalp hematoma.

Sinuses/Orbits: There is mucosal thickening in several ethmoid air
cells. Other visualized paranasal sinuses are clear. Visualized
orbits appear symmetric bilaterally. Note that there is increased
attenuation in each globe which potentially may be artifactual.

Other: Mastoid air cells are clear.

CT CERVICAL SPINE FINDINGS

Alignment: There is no appreciable spondylolisthesis.

Skull base and vertebrae: Skull base and craniocervical junction
regions appear normal. No evident fracture. No blastic or lytic bone
lesions.

Soft tissues and spinal canal: Prevertebral soft tissues and
predental space regions are normal. No paraspinous lesions evident.
No cord canal hematoma appreciable.

Disc levels: There is moderate disc space narrowing at C5-6 and
C6-7. There are anterior osteophytes at C5 and C6. There is a
calcified central and left paracentral disc protrusion at C6-7 with
mild impression on the ventral cord at this level. There is mild
facet hypertrophy at several levels.

Upper chest: Visualized upper lung zones are clear. There is a
degree of underlying centrilobular emphysematous change.

Other: There are foci of calcification in each carotid and
subclavian artery.
IMPRESSION: CT head:

1. No mass or hemorrhage. Gray-white compartments appear normal.
Note that there is a right parietal scalp hematoma. No fracture
evident.

2.  Foci of arterial vascular calcification noted.

3.  Mucosal thickening in several ethmoid air cells.

4. Increased attenuation in both globes may well be artifactual. If
patient is reporting vision difficulty, ophthalmological examination
in this regard may be reasonable.

CT cervical spine: No fracture or spondylolisthesis. Arthropathy at
several levels with a calcified central and left paracentral disc
protrusion at C6-7. No high-grade stenosis. Foci of calcification in
subclavian and carotid arteries noted. Underlying centrilobular
emphysematous change.

Emphysema ([id number]).

## 2020-03-05 DEATH — deceased

## 2020-04-09 IMAGING — CR DG CHEST 2V
2 series · 2 of 2 positions shown · non-contrast
Comparison: 02/02/2018

CLINICAL DATA: Chest pain and cough for 2 weeks.

EXAM:
CHEST - 2 VIEW

[chest lat]
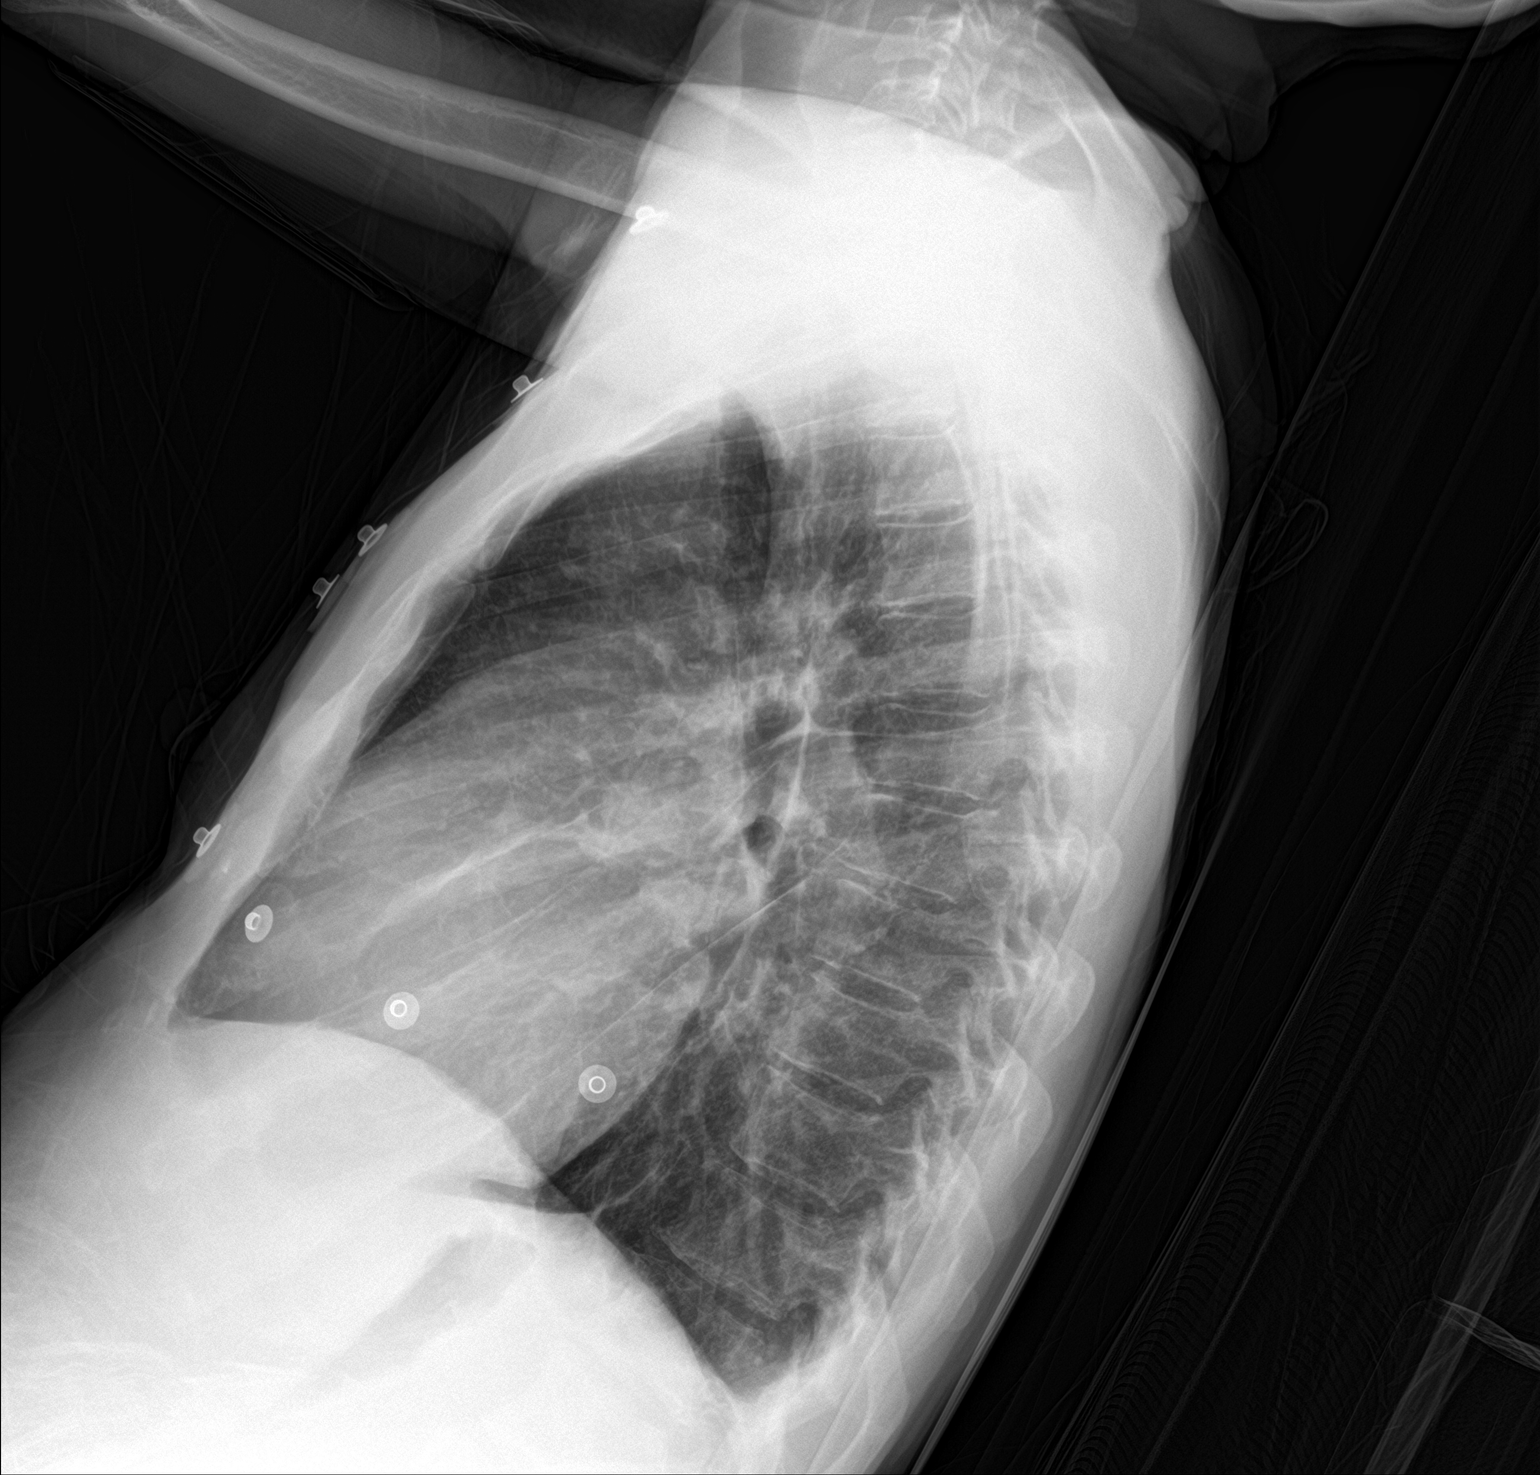

[chest ap]
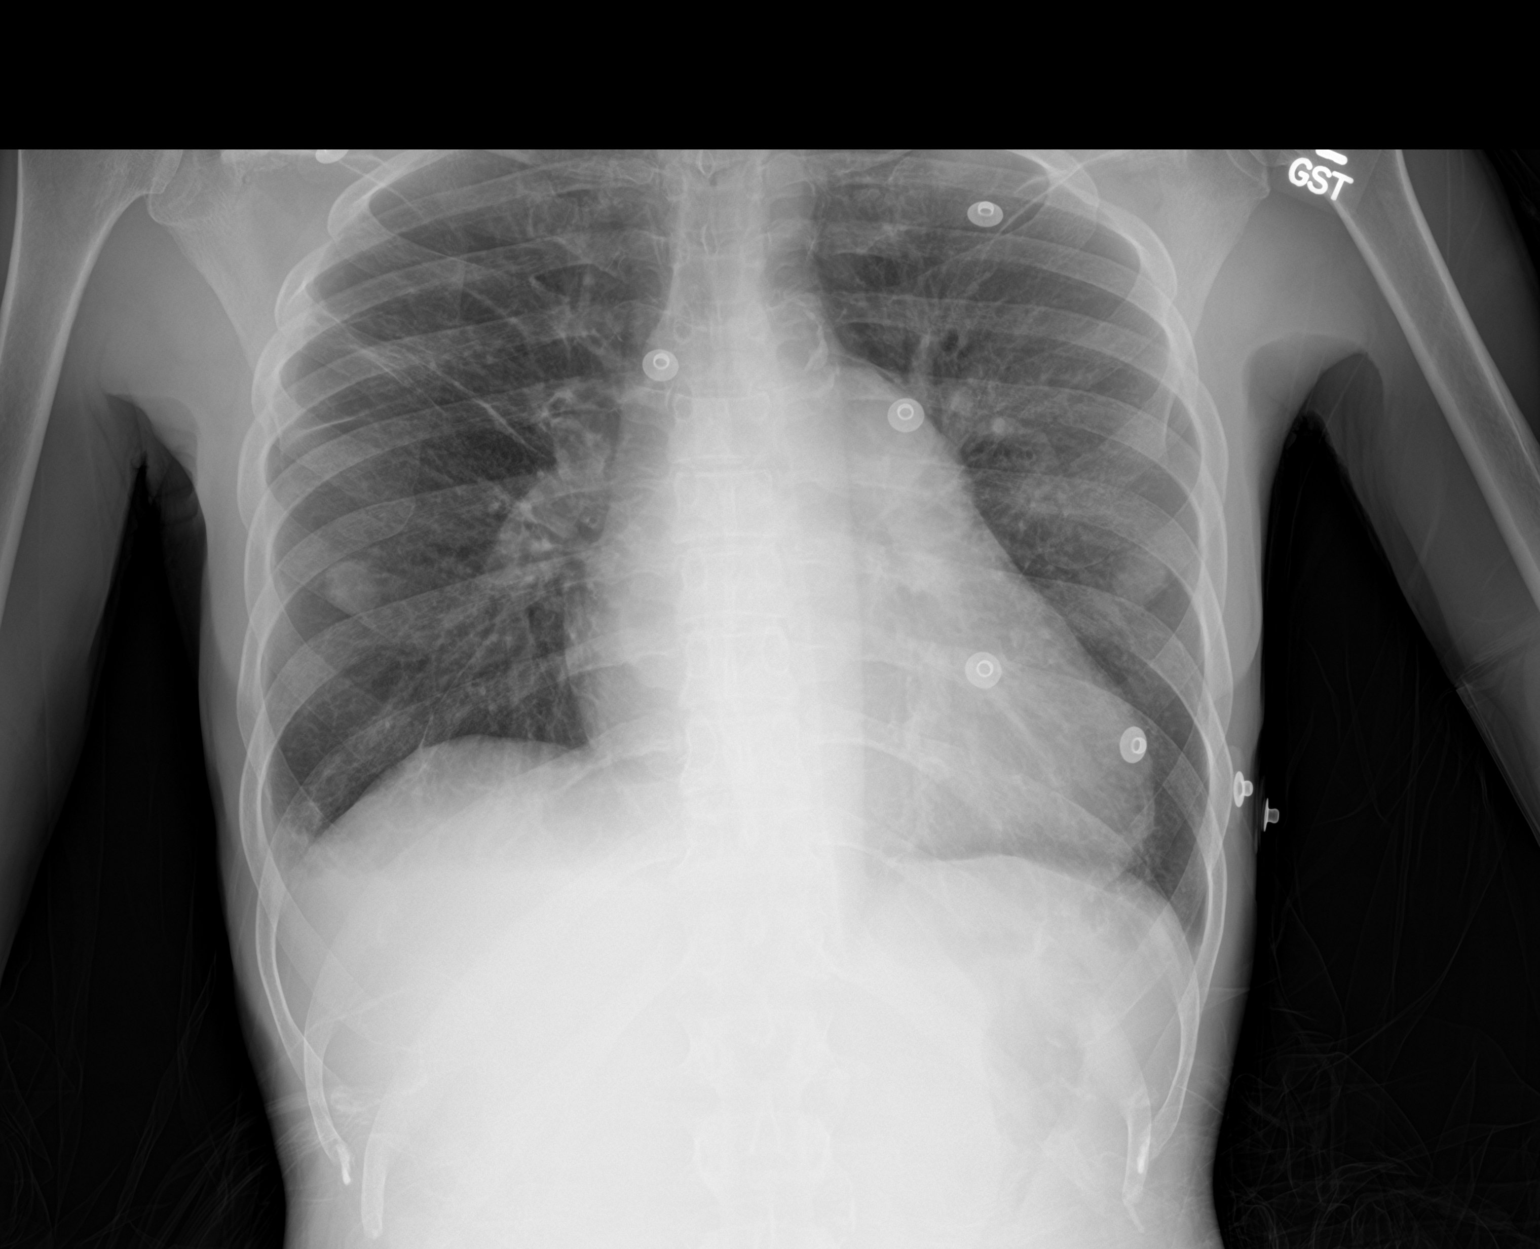

[2 of 2 positions shown; findings below may reference images not displayed]

FINDINGS: Mild cardiac enlargement. Shallow inspiration. Previous right lung
infiltrates have resolved. There is a small right pleural effusion
with mild basilar atelectasis. No pneumothorax. Calcification of the
aorta. Mediastinal contours appear intact.
IMPRESSION: Small right pleural effusion with basilar atelectasis. Resolution of
previous right lung infiltrates.

## 2020-05-02 IMAGING — CR DG CHEST 2V
2 series · 2 of 2 positions shown · non-contrast
Comparison: 04/26/2018

CLINICAL DATA: Shortness of breath and left-sided chest pain for 2
days.

EXAM:
CHEST - 2 VIEW

[chest lat]
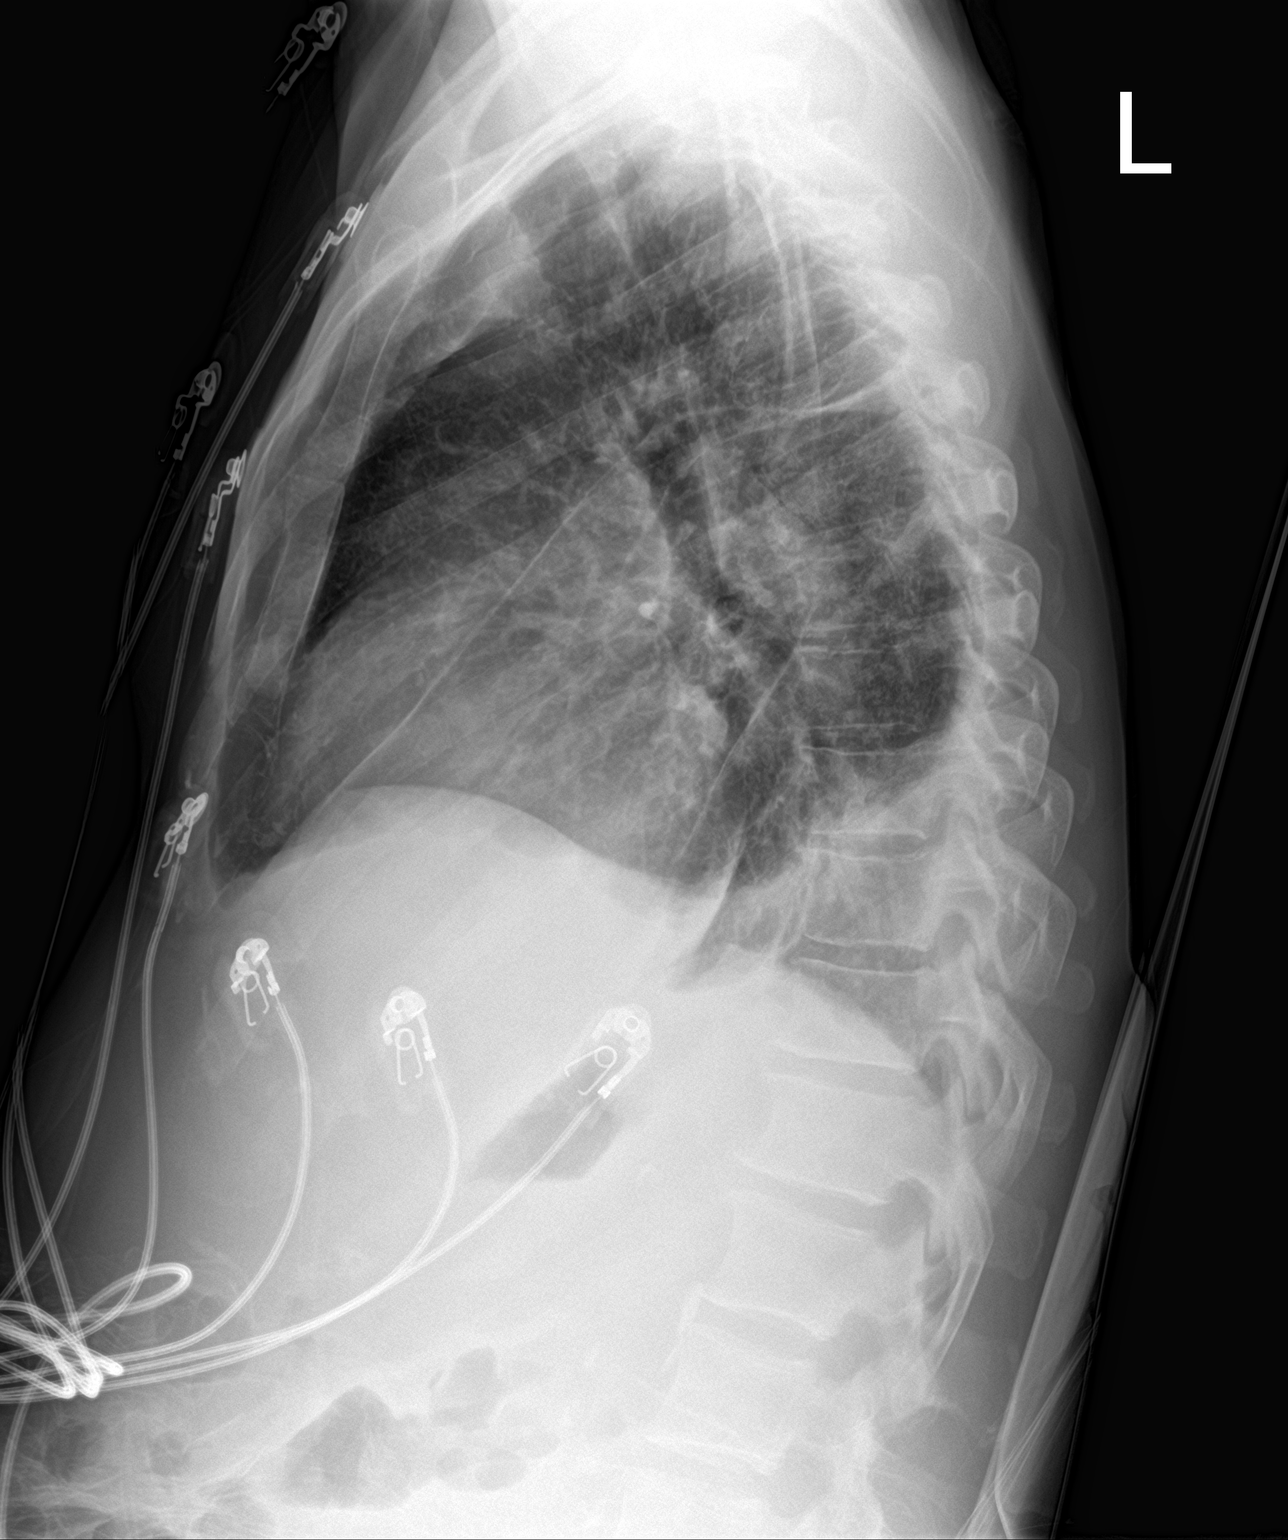

[chest ap]
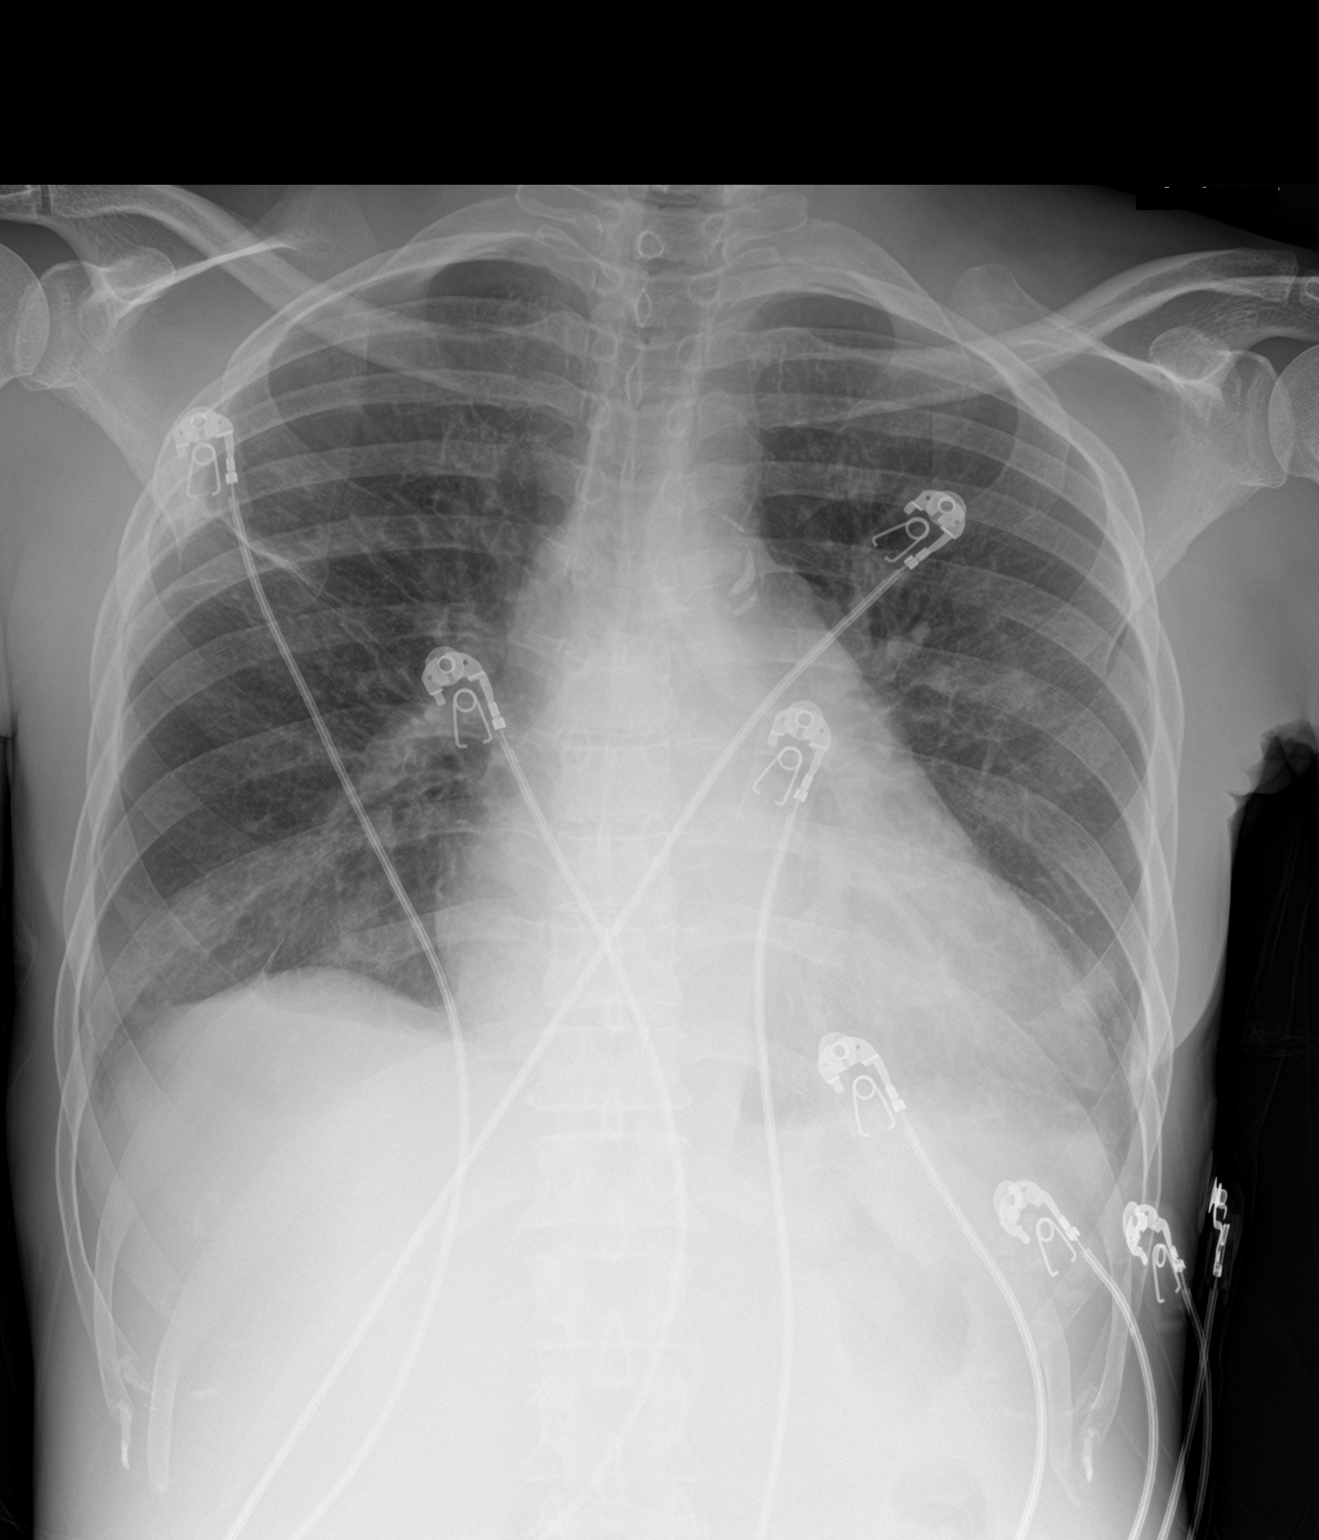

[2 of 2 positions shown; findings below may reference images not displayed]

FINDINGS: Enlarged cardiac silhouette with preferential enlargement of the
left ventricle. Calcific atherosclerotic disease of the aorta.
Mediastinal contours appear intact.

Right pleural effusion with associated compressive atelectasis.

Osseous structures are without acute abnormality. Soft tissues are
grossly normal.
IMPRESSION: Enlarged cardiac silhouette with preferential enlargement of the
left chambers.

Moderate in size right pleural effusion.

Calcific atherosclerotic disease of the aorta.

## 2020-06-03 IMAGING — CR DG CHEST 2V
2 series · 2 of 2 positions shown · non-contrast
Comparison: 05/19/2018

CLINICAL DATA: Cough and dyspnea. Nausea, vomiting and weakness x4
days.

EXAM:
CHEST - 2 VIEW

[w chest lat]
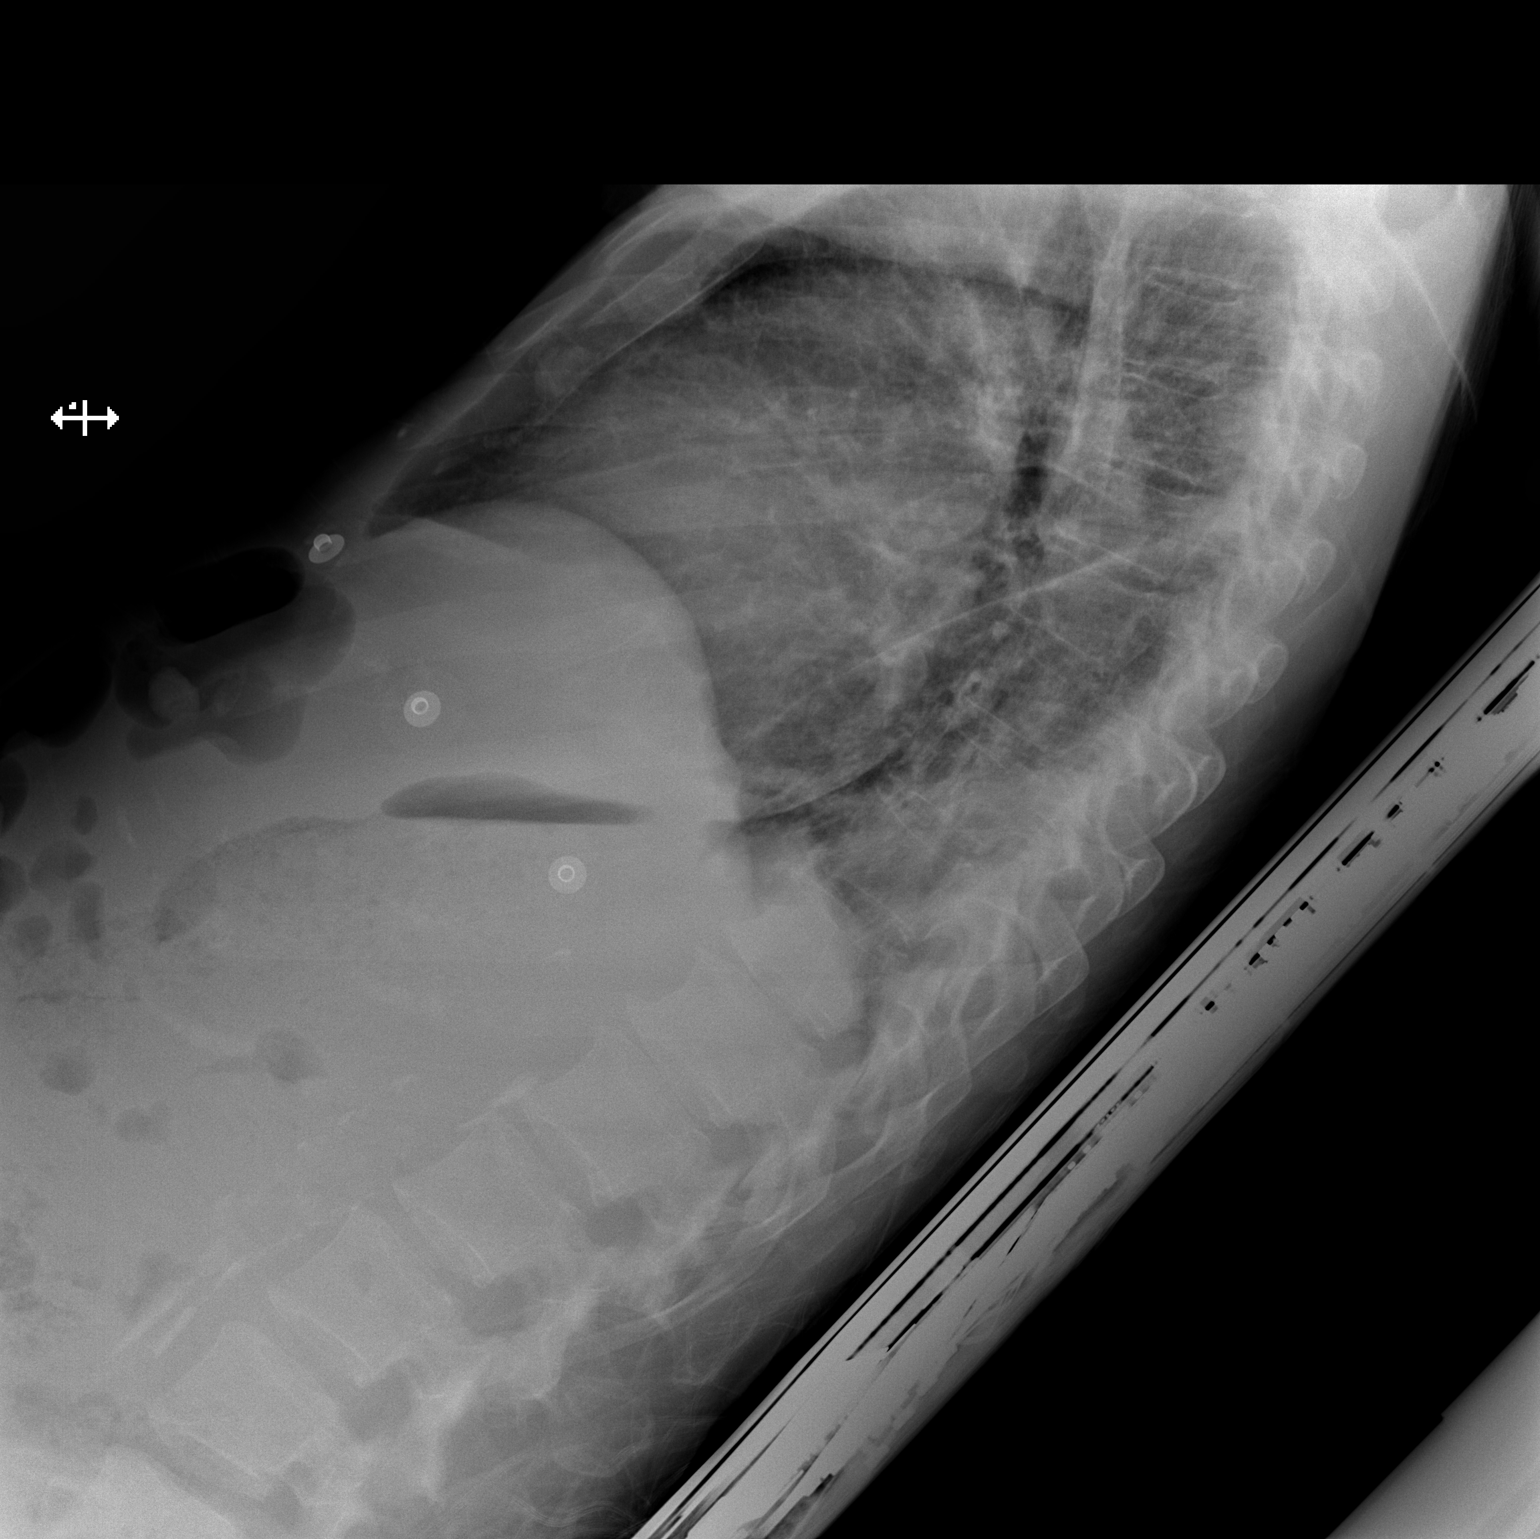

[x chest ap]
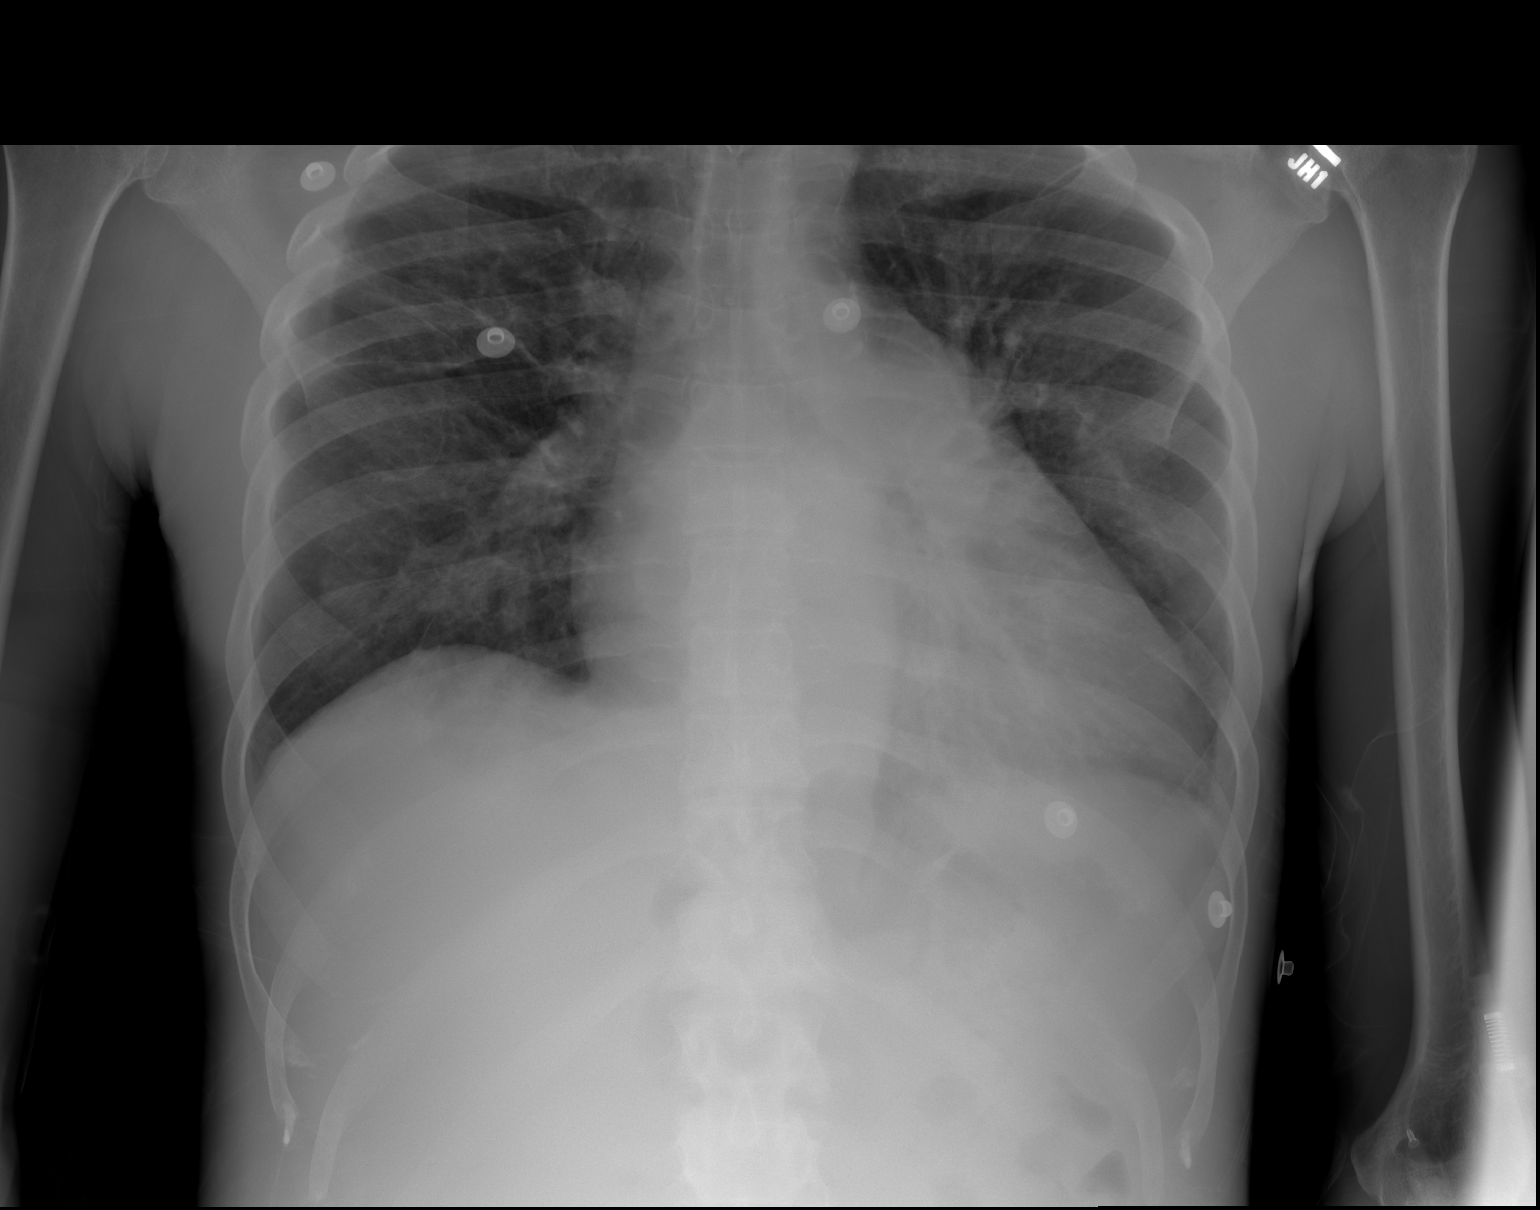

[2 of 2 positions shown; findings below may reference images not displayed]

FINDINGS: There is stable cardiomegaly with aortic atherosclerosis and
posterior right moderate pleural effusion. Mild interstitial edema
is identified with low lung volumes. No alveolar consolidation. No
acute osseous abnormality.
IMPRESSION: Stable cardiomegaly with aortic atherosclerosis. Mild interstitial
edema and stable posterior right pleural effusion.

## 2020-06-04 IMAGING — CT CT ABD-PELV W/ CM
2 of 5 series · 15 of 46 positions shown, 17 images · IV contrast (ISOVUE)
Comparison: 10/12/2010

CLINICAL DATA: Nausea, vomiting, weakness, and generalized
abdominal pain. Rash in the groin for 3 days.

EXAM:
CT ABDOMEN AND PELVIS WITH CONTRAST
TECHNIQUE: Multidetector CT imaging of the abdomen and pelvis was performed
using the standard protocol following bolus administration of
intravenous contrast.
CONTRAST:  100mL [id number] IOPAMIDOL ([id number]) INJECTION 61%

[Series 2: axial st · axial · 0.80mm/px · z∈[-342,+38]mm · 12 of 88 slices shown, 14 images]
[im 6/88  soft-tissue]
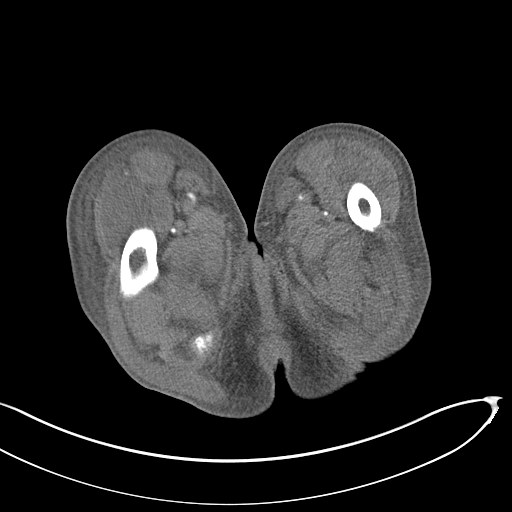
[im 6/88  bone]
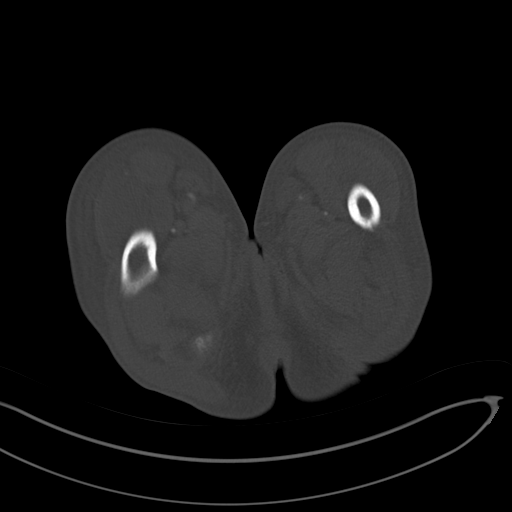
[im 11/88  soft-tissue]
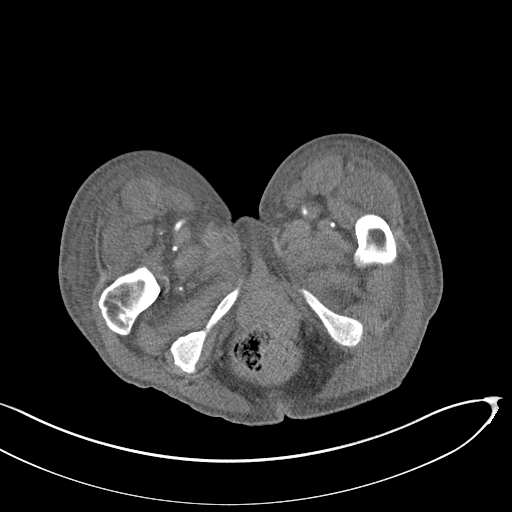
[im 22/88  soft-tissue]
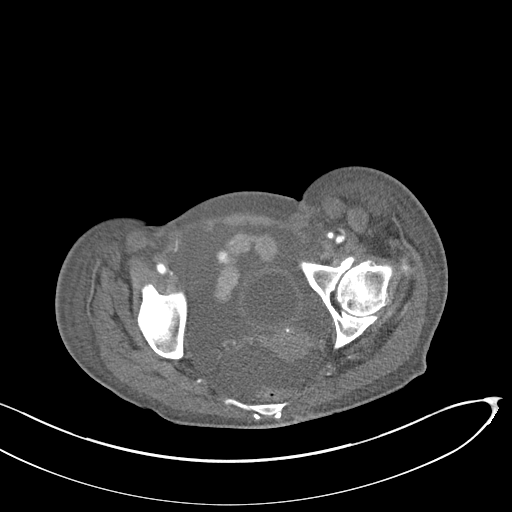
[im 28/88  soft-tissue]
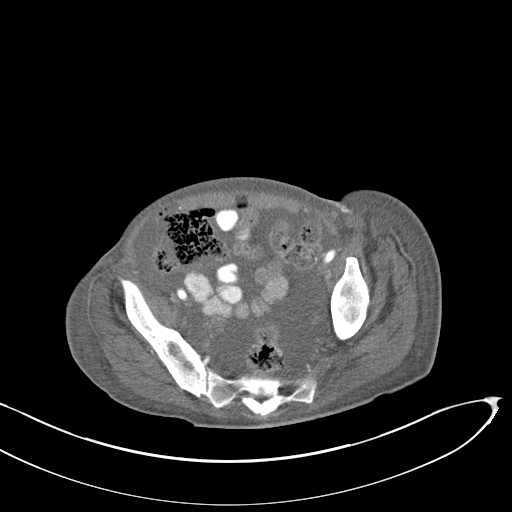
[im 33/88  soft-tissue]
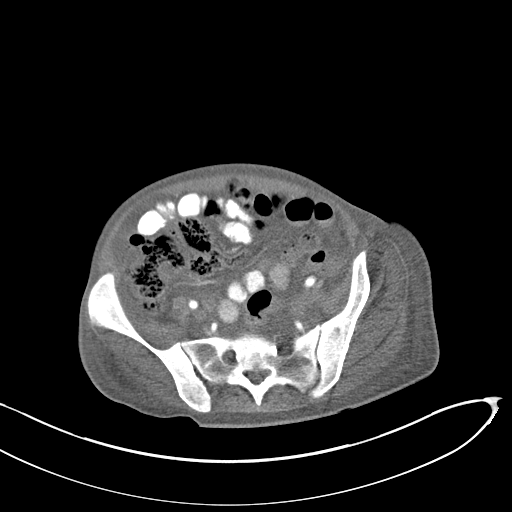
[im 39/88  soft-tissue]
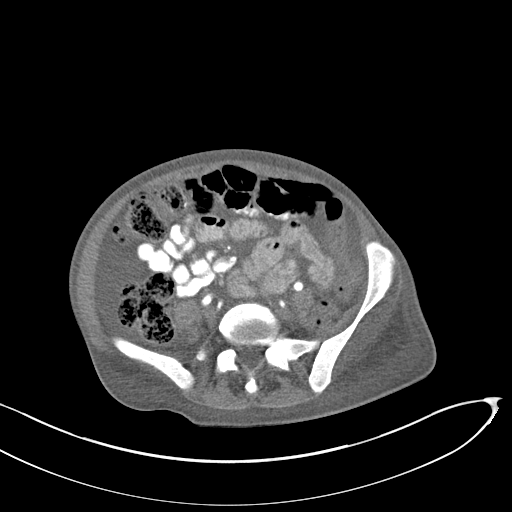
[im 49/88  soft-tissue]
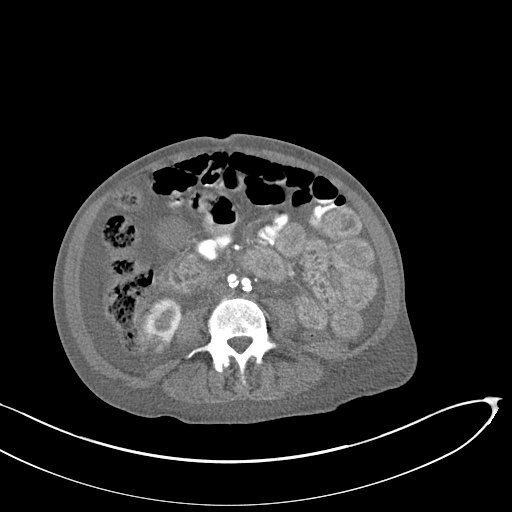
[im 55/88  soft-tissue]
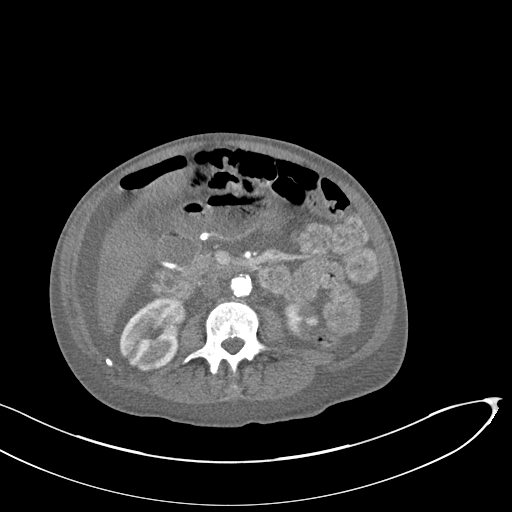
[im 60/88  soft-tissue]
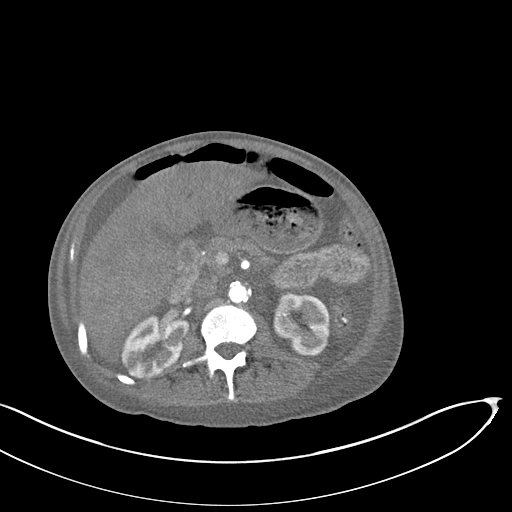
[im 60/88  bone]
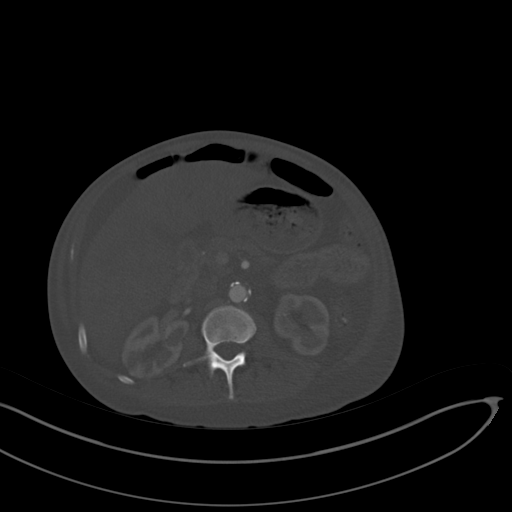
[im 66/88  soft-tissue]
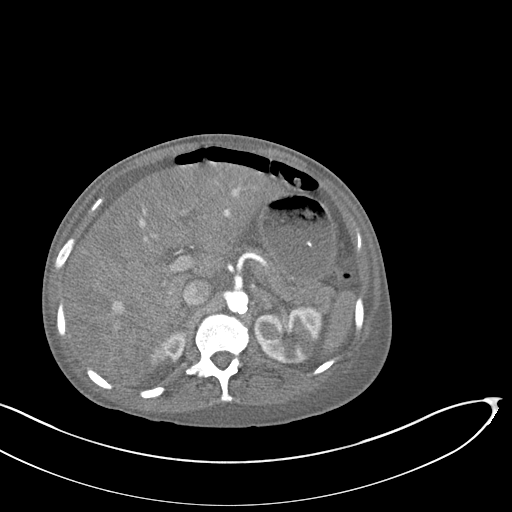
[im 77/88  soft-tissue]
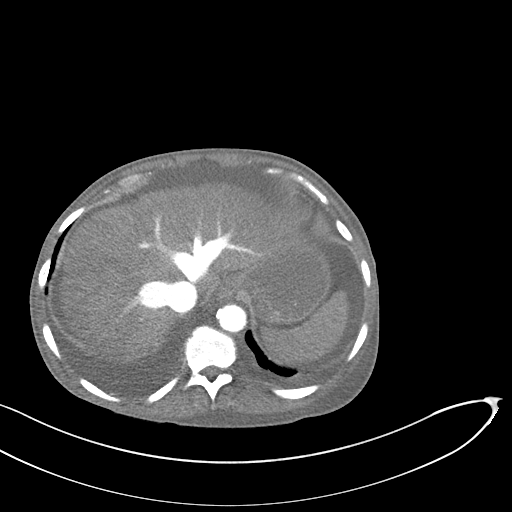
[im 82/88  soft-tissue]
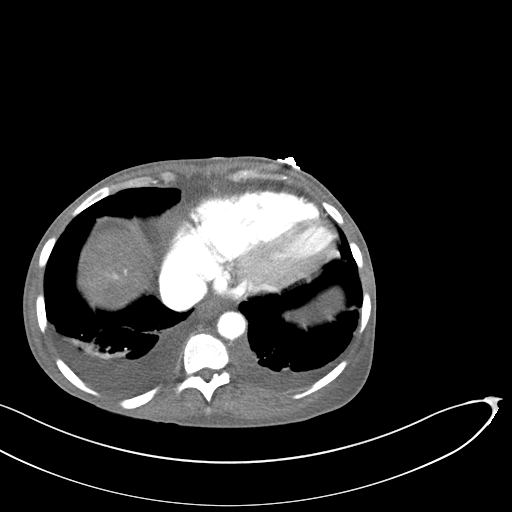

[Series 4: coronal st · coronal · 0.62mm/px · 3 of 98 slices shown]
[im 33/98  soft-tissue]
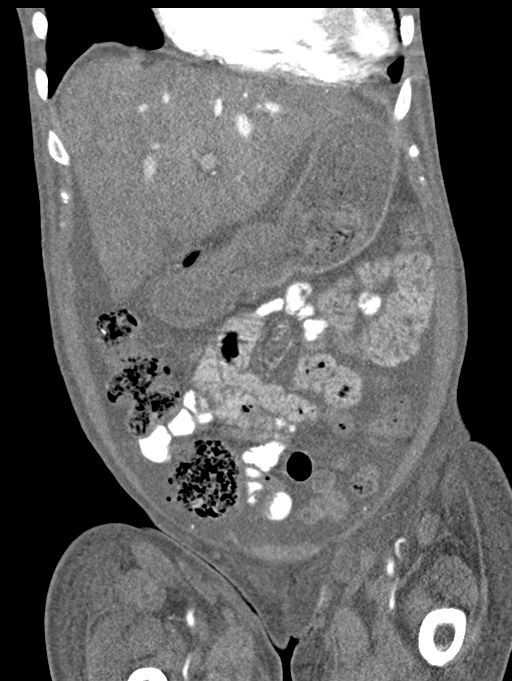
[im 44/98  soft-tissue]
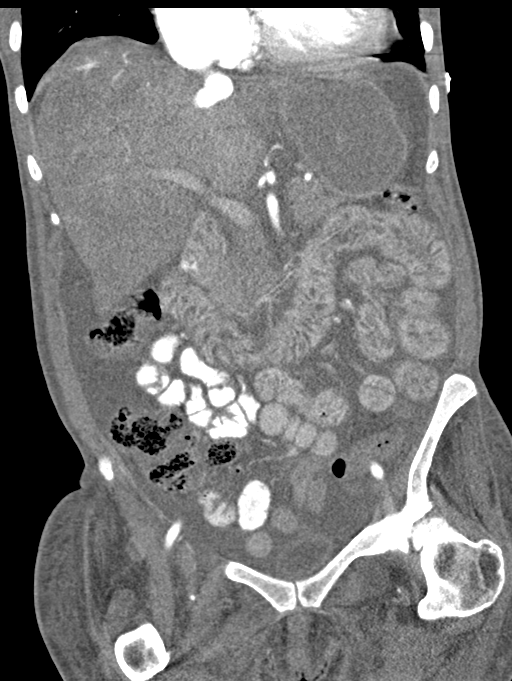
[im 54/98  soft-tissue]
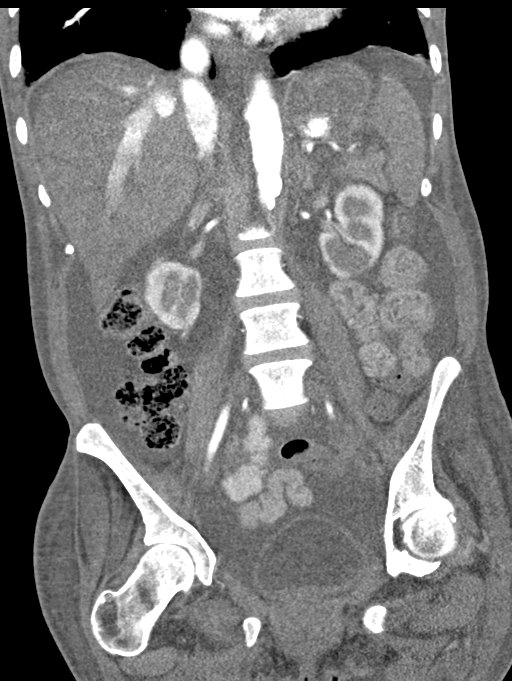

[15 of 46 positions shown; findings below may reference images not displayed]

FINDINGS: Lower chest: Cardiac enlargement. Small to moderate bilateral
pleural effusions with basilar atelectasis.

Hepatobiliary: There is prominent reflux of contrast material into
the IVC and hepatic veins with dilated patent veins. Heterogeneous
parenchymal echotexture. Changes are most consistent with passive
hepatic congestion (nutmeg liver). Gallbladder is contracted, likely
physiologic. No bile duct dilatation.

Pancreas: Unremarkable. No pancreatic ductal dilatation or
surrounding inflammatory changes.

Spleen: Normal in size without focal abnormality.

Adrenals/Urinary Tract: No adrenal gland nodules. Renal nephrograms
are symmetrical. Nephrograms are somewhat heterogeneous on the
delayed phase which may indicate pyelonephritis. No focal renal
lesions. No hydronephrosis or hydroureter. Bladder is unremarkable.

Stomach/Bowel: Stomach, small bowel, and colon are not abnormally
distended. There is diffuse wall thickening of the small bowel. This
is likely due to hypoproteinemia. Enteritis could also have this
appearance. Scattered stool throughout the colon. No colonic wall
thickening. Appendix is normal.

Vascular/Lymphatic: Aortic atherosclerosis. No enlarged abdominal or
pelvic lymph nodes.

Reproductive: Uterus and bilateral adnexa are unremarkable.

Other: Prominent diffuse abdominal and pelvic ascites. No free air
in the abdomen. Abdominal wall musculature appears intact. Diffuse
edema throughout the subcutaneous fatty tissues.

Musculoskeletal: No acute or significant osseous findings.
IMPRESSION: 1. Cardiac enlargement with bilateral pleural effusions and basilar
atelectasis.
2. Prominent reflux of contrast material into the IVC and hepatic
veins with dilated patent veins suggesting passive hepatic
congestion.
3. Diffuse wall thickening of the small bowel probably due to
hypoproteinemia. Nonspecific enteritis also possible. No evidence of
bowel obstruction.
4. Diffuse abdominal and pelvic ascites. Diffuse edema throughout
the subcutaneous fatty tissues.

## 2020-07-07 IMAGING — DX DG CHEST 1V PORT
1 series · 1 of 1 positions shown · non-contrast
Comparison: Chest radiograph 06/20/2018

CLINICAL DATA: Chest pain

EXAM:
PORTABLE CHEST 1 VIEW

[chest ap]
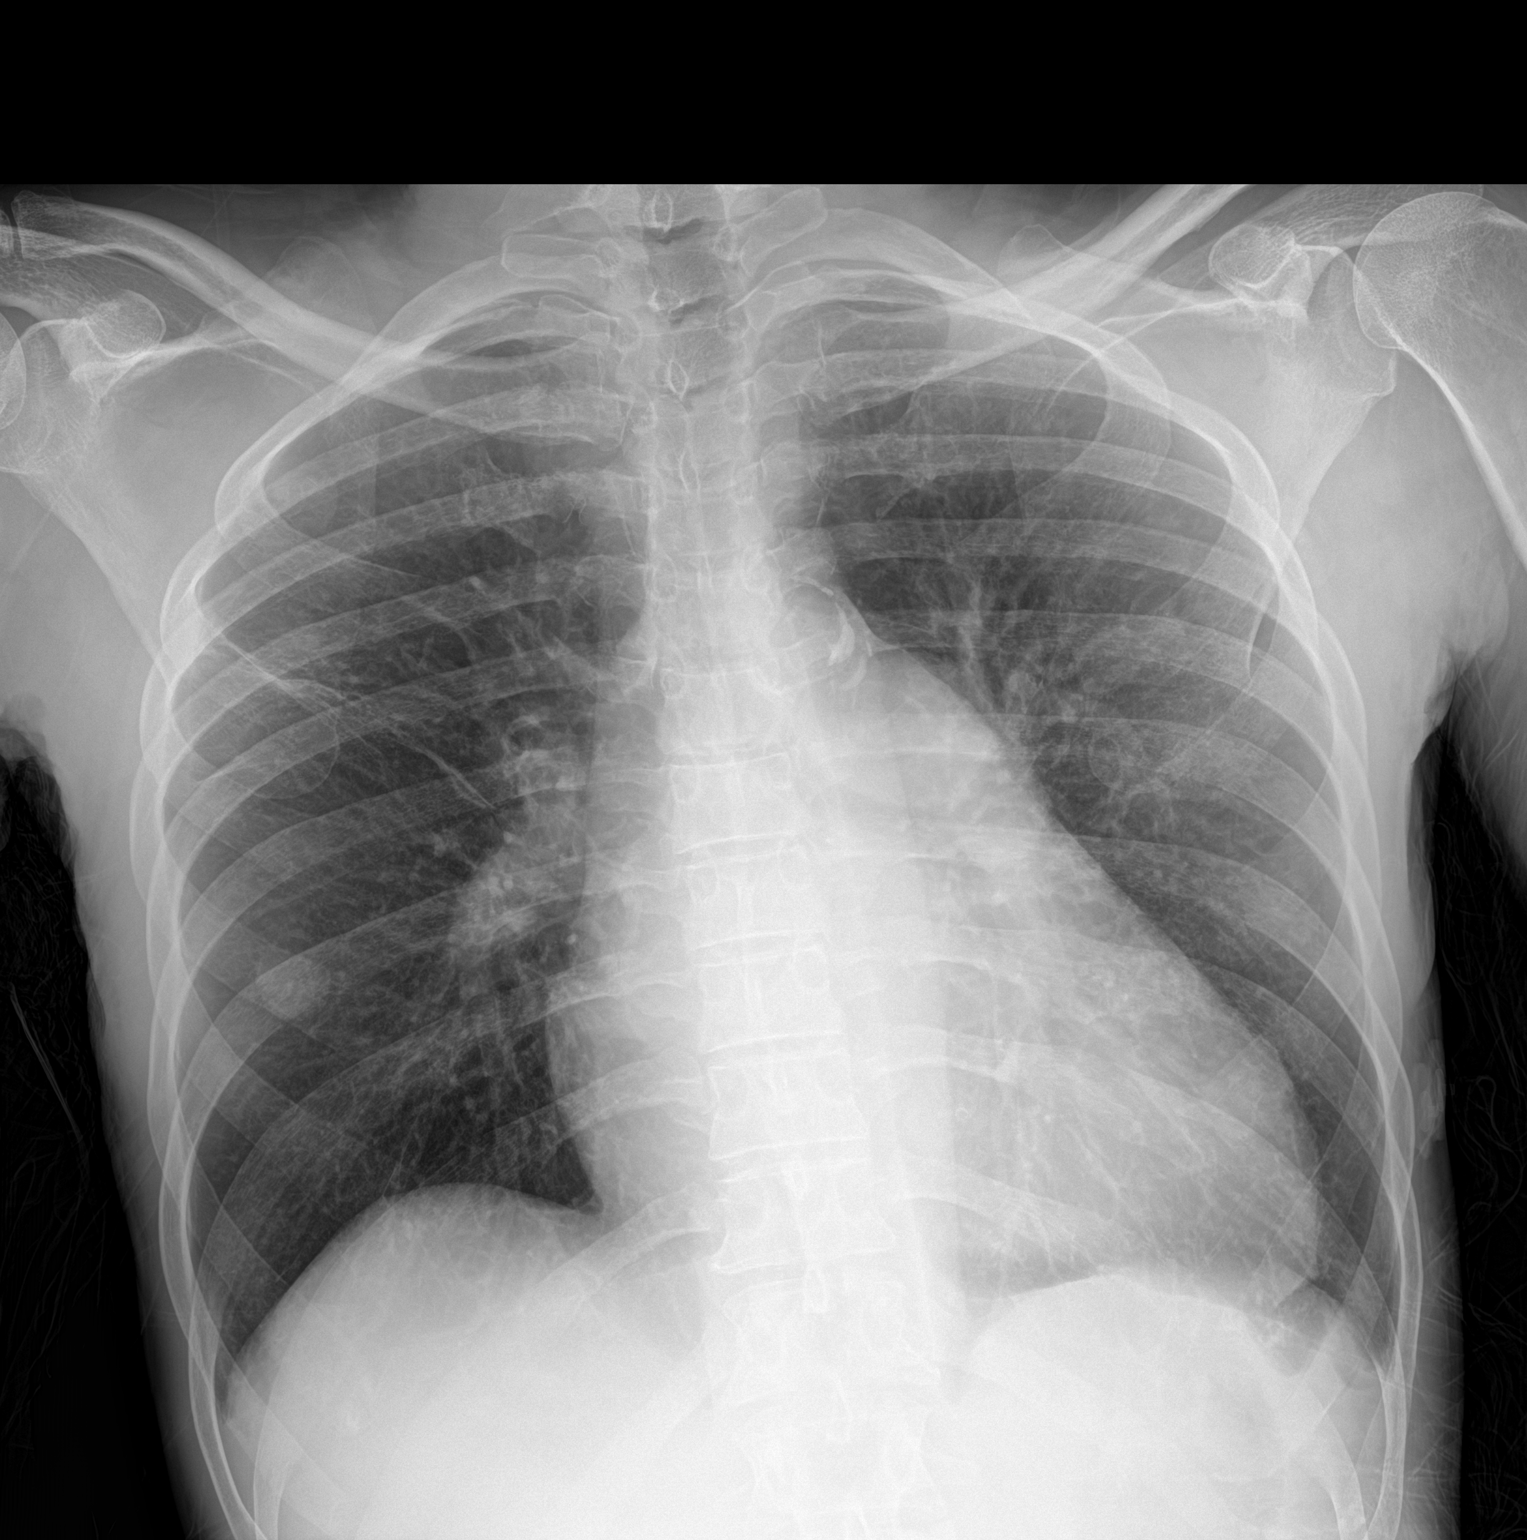

[1 of 1 positions shown; findings below may reference images not displayed]

FINDINGS: Moderate cardiomegaly with calcific aortic atherosclerosis. No
pulmonary edema or focal airspace consolidation. No pleural effusion
or pneumothorax.
IMPRESSION: Unchanged moderate cardiomegaly without pulmonary edema.

## 2020-07-12 IMAGING — CT CT ABD-PELV W/ CM
2 of 5 series · 15 of 46 positions shown, 17 images · IV contrast (ISOVUE)
Comparison: CT abdomen and pelvis June 21, 2018

CLINICAL DATA: Abdominal pain, nausea, vomiting and diarrhea
beginning this afternoon. History of substance abuse, hernia repair.

EXAM:
CT ABDOMEN AND PELVIS WITH CONTRAST
TECHNIQUE: Multidetector CT imaging of the abdomen and pelvis was performed
using the standard protocol following bolus administration of
intravenous contrast.
CONTRAST:  80mL [id number] IOPAMIDOL ([id number]) INJECTION 61%

[Series 2: axial st · axial · 0.68mm/px · z∈[-668,-332]mm · 12 of 79 slices shown, 14 images]
[im 6/79  soft-tissue]
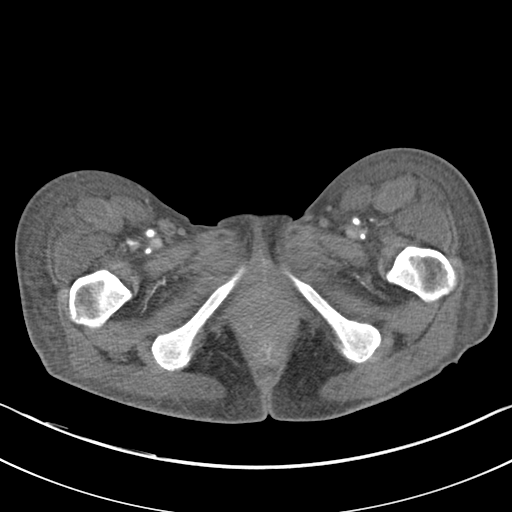
[im 6/79  bone]
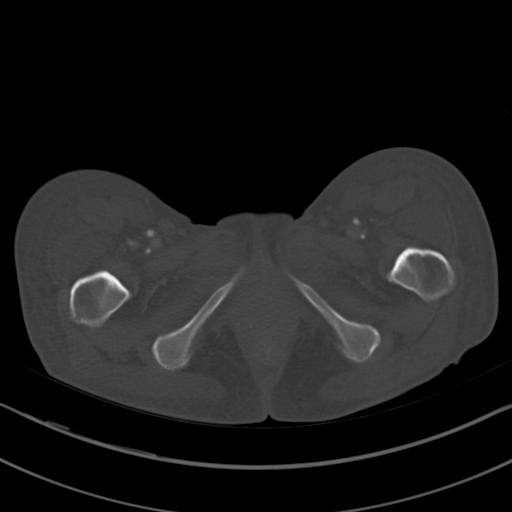
[im 11/79  soft-tissue]
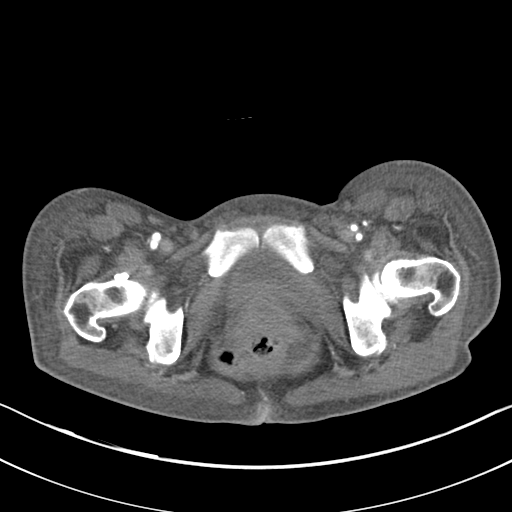
[im 16/79  soft-tissue]
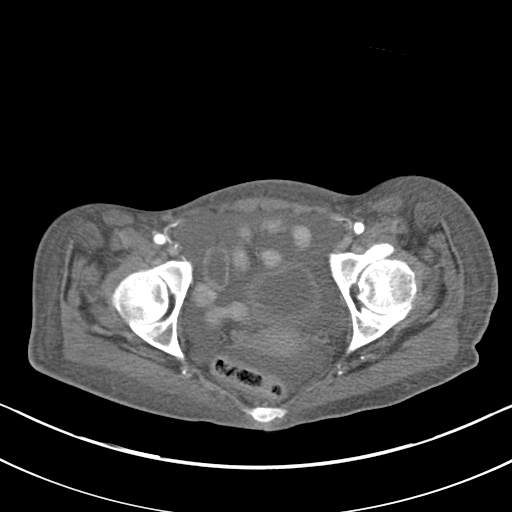
[im 27/79  soft-tissue]
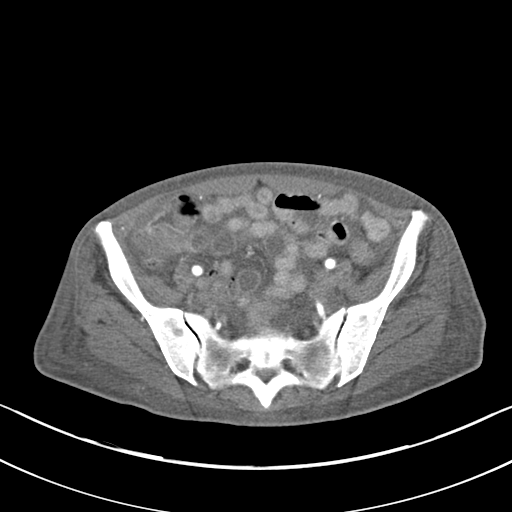
[im 32/79  soft-tissue]
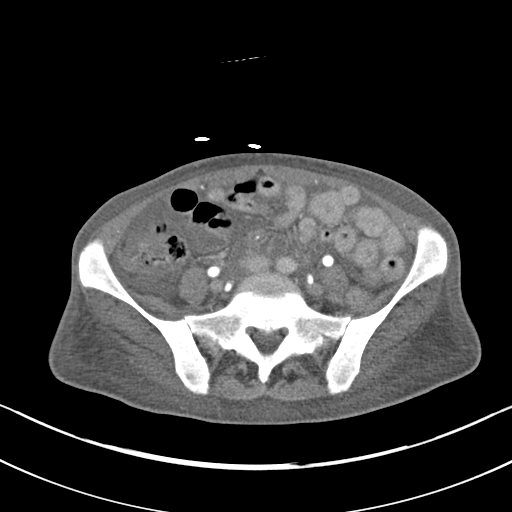
[im 37/79  soft-tissue]
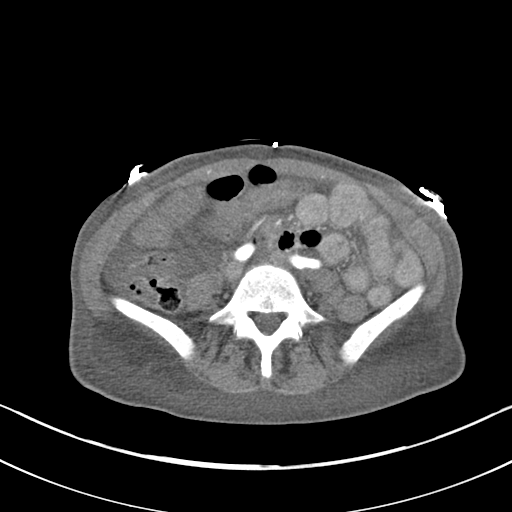
[im 42/79  soft-tissue]
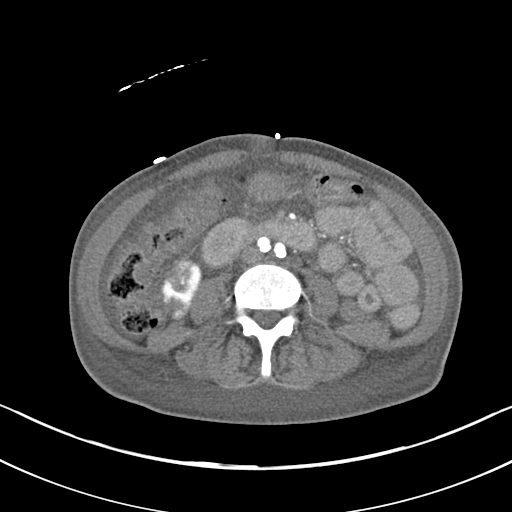
[im 47/79  soft-tissue]
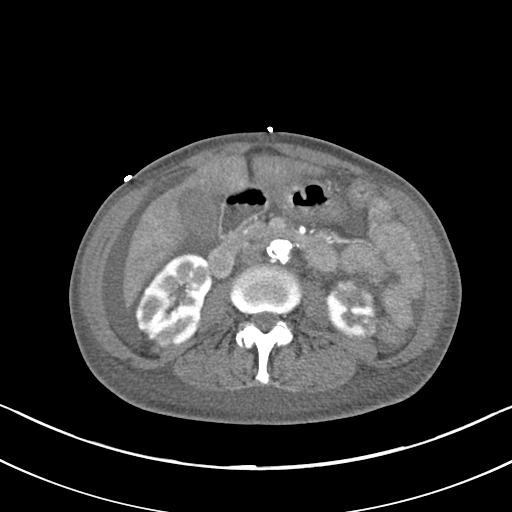
[im 53/79  soft-tissue]
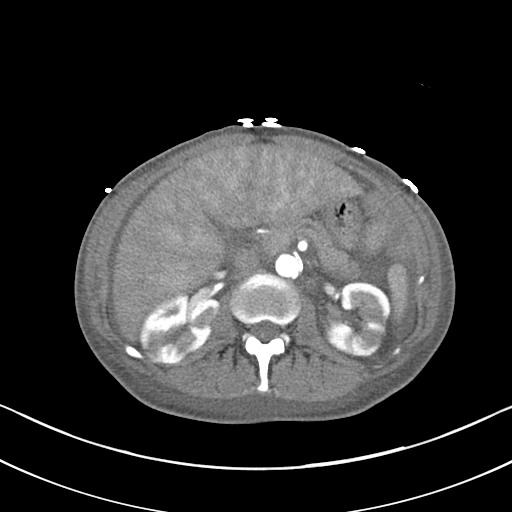
[im 53/79  bone]
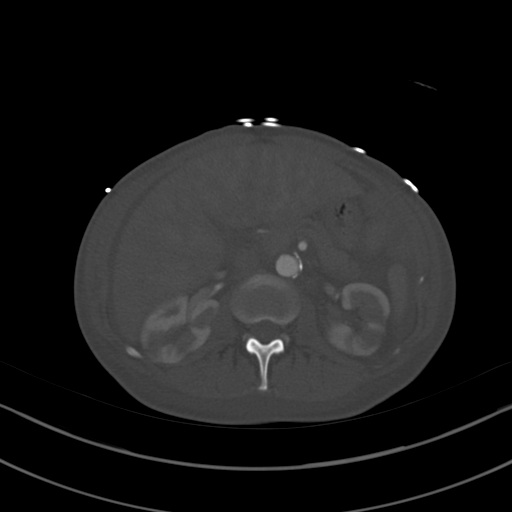
[im 63/79  soft-tissue]
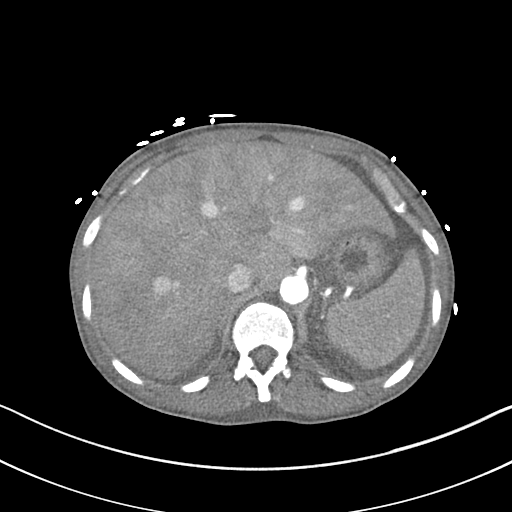
[im 68/79  soft-tissue]
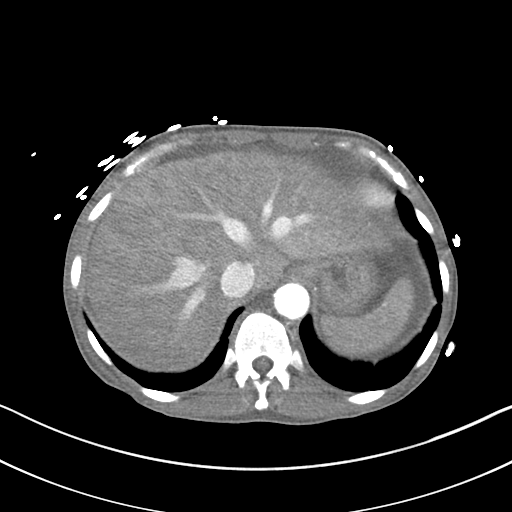
[im 73/79  soft-tissue]
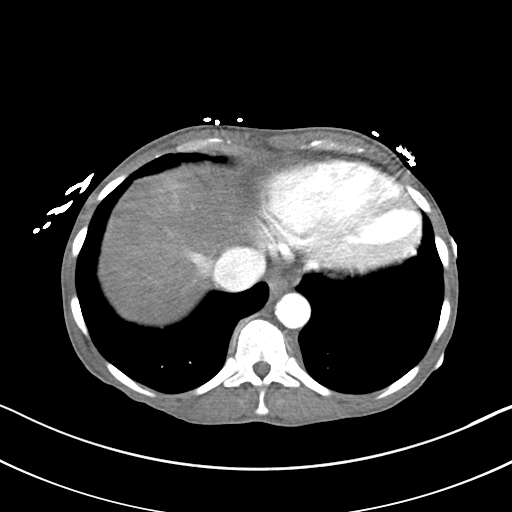

[Series 5: coronal st · coronal · 0.62mm/px · 3 of 69 slices shown]
[im 23/69  soft-tissue]
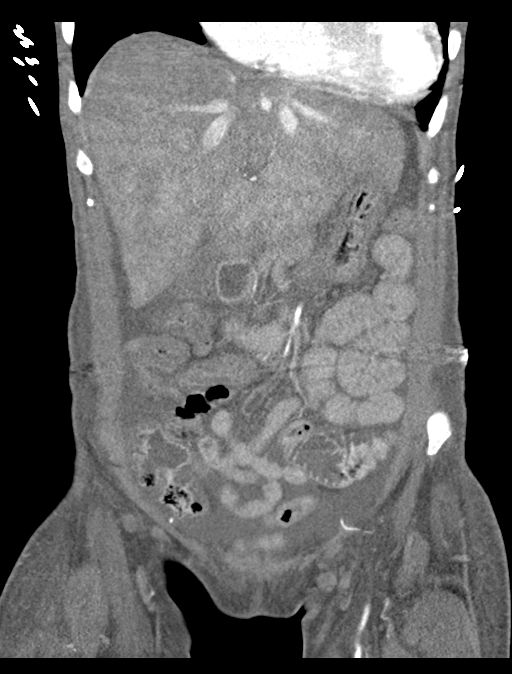
[im 31/69  soft-tissue]
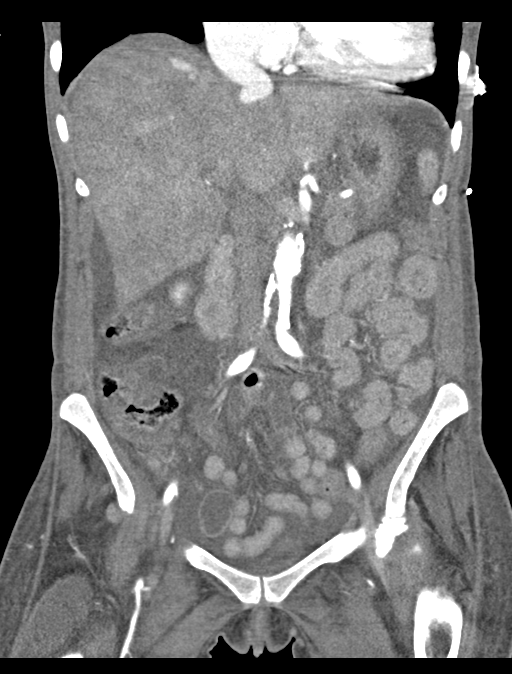
[im 38/69  soft-tissue]
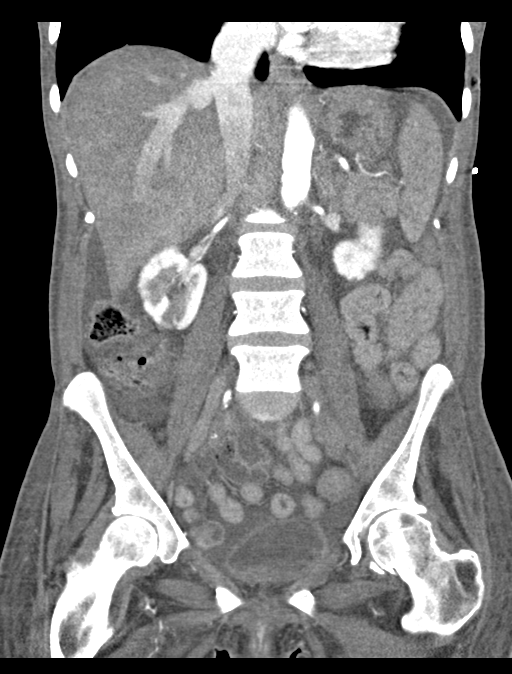

[15 of 46 positions shown; findings below may reference images not displayed]

FINDINGS: LOWER CHEST: Lung bases are clear. Stable cardiomegaly, reflux
contrast consistent with RIGHT heart failure. No pericardial
effusion.

HEPATOBILIARY: Heterogeneous liver with enlarged IVC with contrast
reflux consistent with passive hepatic congestion. Mild
hepatomegaly. Normal gallbladder.

PANCREAS: Too small to characterize hypodensity in spleen.

SPLEEN: Normal.

ADRENALS/URINARY TRACT: Kidneys are orthotopic, demonstrating
symmetric enhancement. Multifocal scarring bilateral kidneys. No
nephrolithiasis, hydronephrosis or solid renal masses. The
unopacified ureters are normal in course and caliber. Delayed
imaging through the kidneys demonstrates symmetric prompt contrast
excretion within the proximal urinary collecting system. Urinary
bladder is partially distended and unremarkable. Normal adrenal
glands.

STOMACH/BOWEL: The stomach, small and large bowel are normal in
course and caliber without inflammatory changes. Mildly thickened:
Consistent with ascites. Normal appendix.

VASCULAR/LYMPHATIC: Aortoiliac vessels are normal in course and
caliber. Moderate calcific atherosclerosis. No lymphadenopathy by CT
size criteria.

REPRODUCTIVE: Normal.

OTHER: Moderate ascites decreased from prior examination. No
intraperitoneal free air.

MUSCULOSKELETAL: Nonacute. Mild anasarca. Old RIGHT L3 and L4
transverse process fractures. RIGHT hip loose bodies. Moderate to
severe LEFT hip osteoarthrosis.
IMPRESSION: 1. No acute intra-abdominal/pelvic process.
2. Moderate ascites, decreased from prior examination.
3. Stable hepatomegaly with findings of passive hepatic congestion.

Aortic Atherosclerosis ([id number].X).

## 2020-07-13 IMAGING — CR DG FEMUR 2+V*L*
2 series · 2 of 2 positions shown · non-contrast
Comparison: None.

CLINICAL DATA: Patient found laying on a bench at a bus stop with
pain in the legs. Erythema and swelling noted at site of amputation.

EXAM:
LEFT FEMUR 2 VIEWS

[x femur proximal ap left]
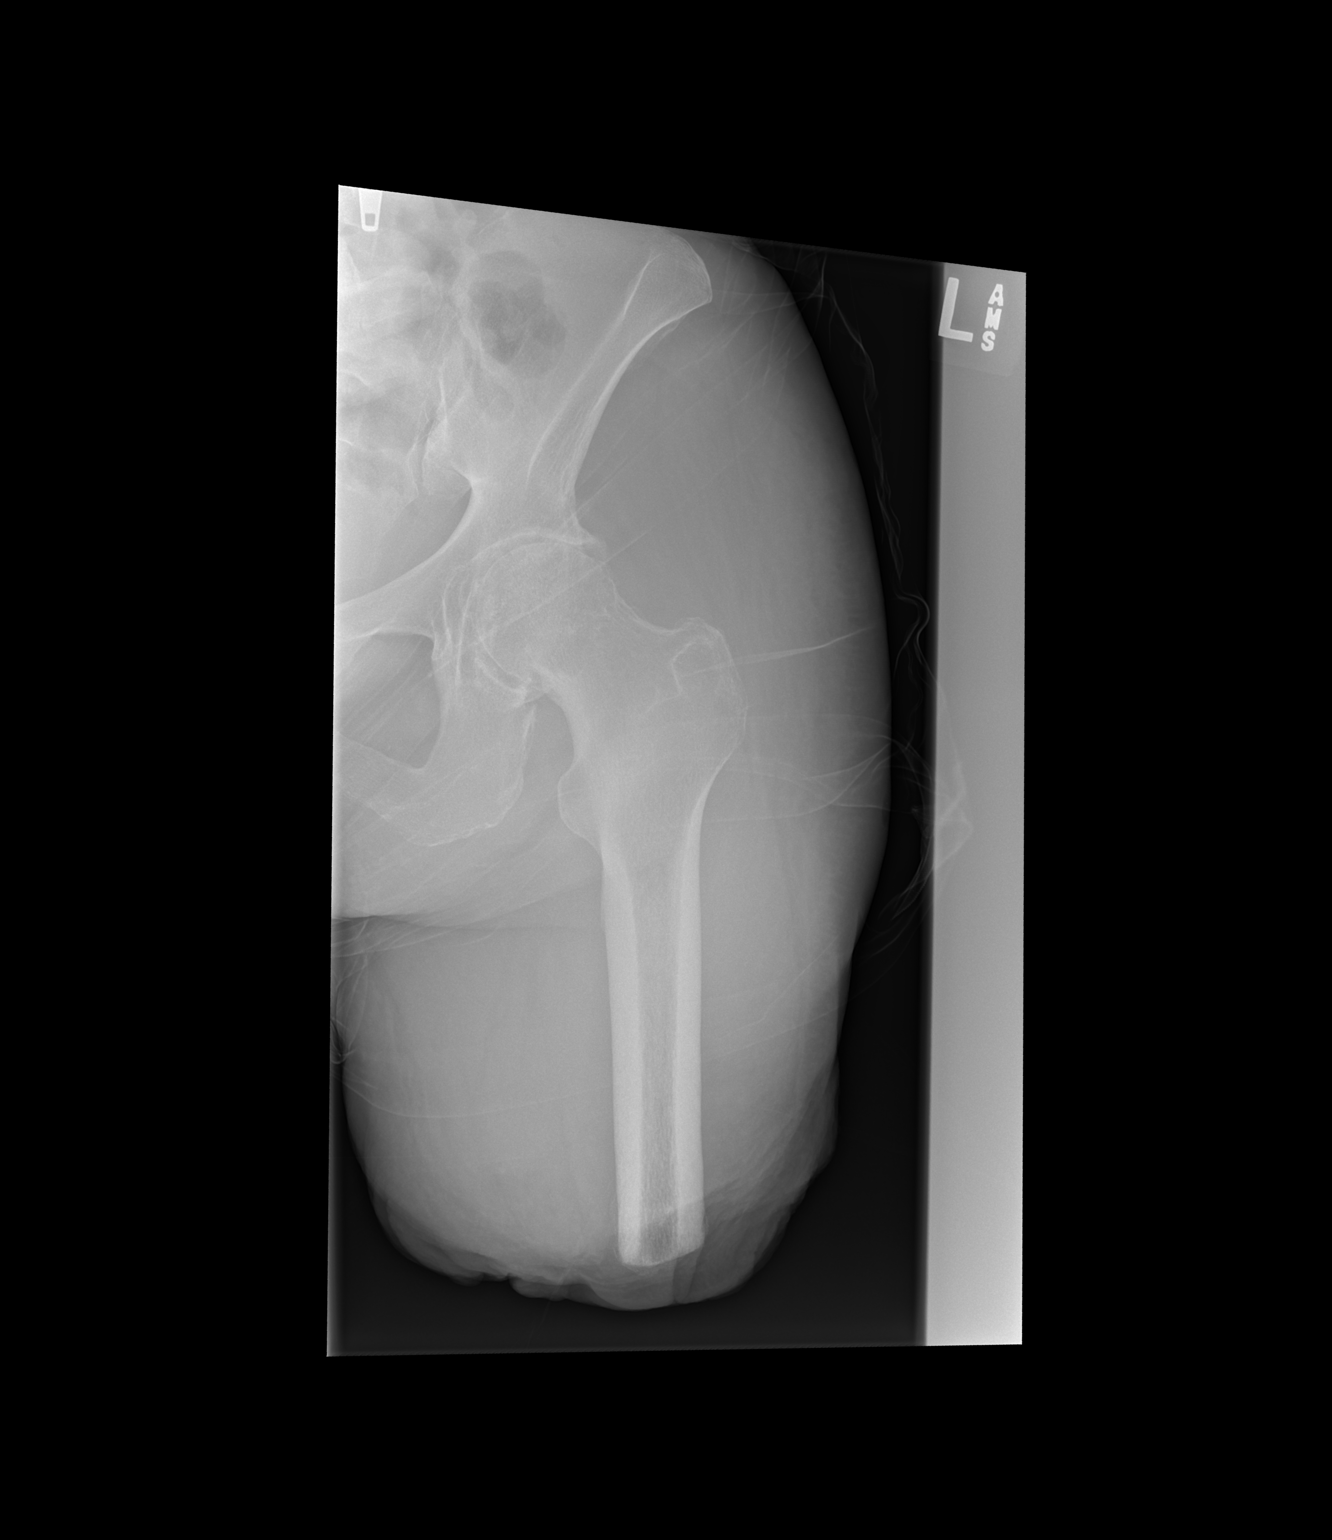

[w hip lat left]
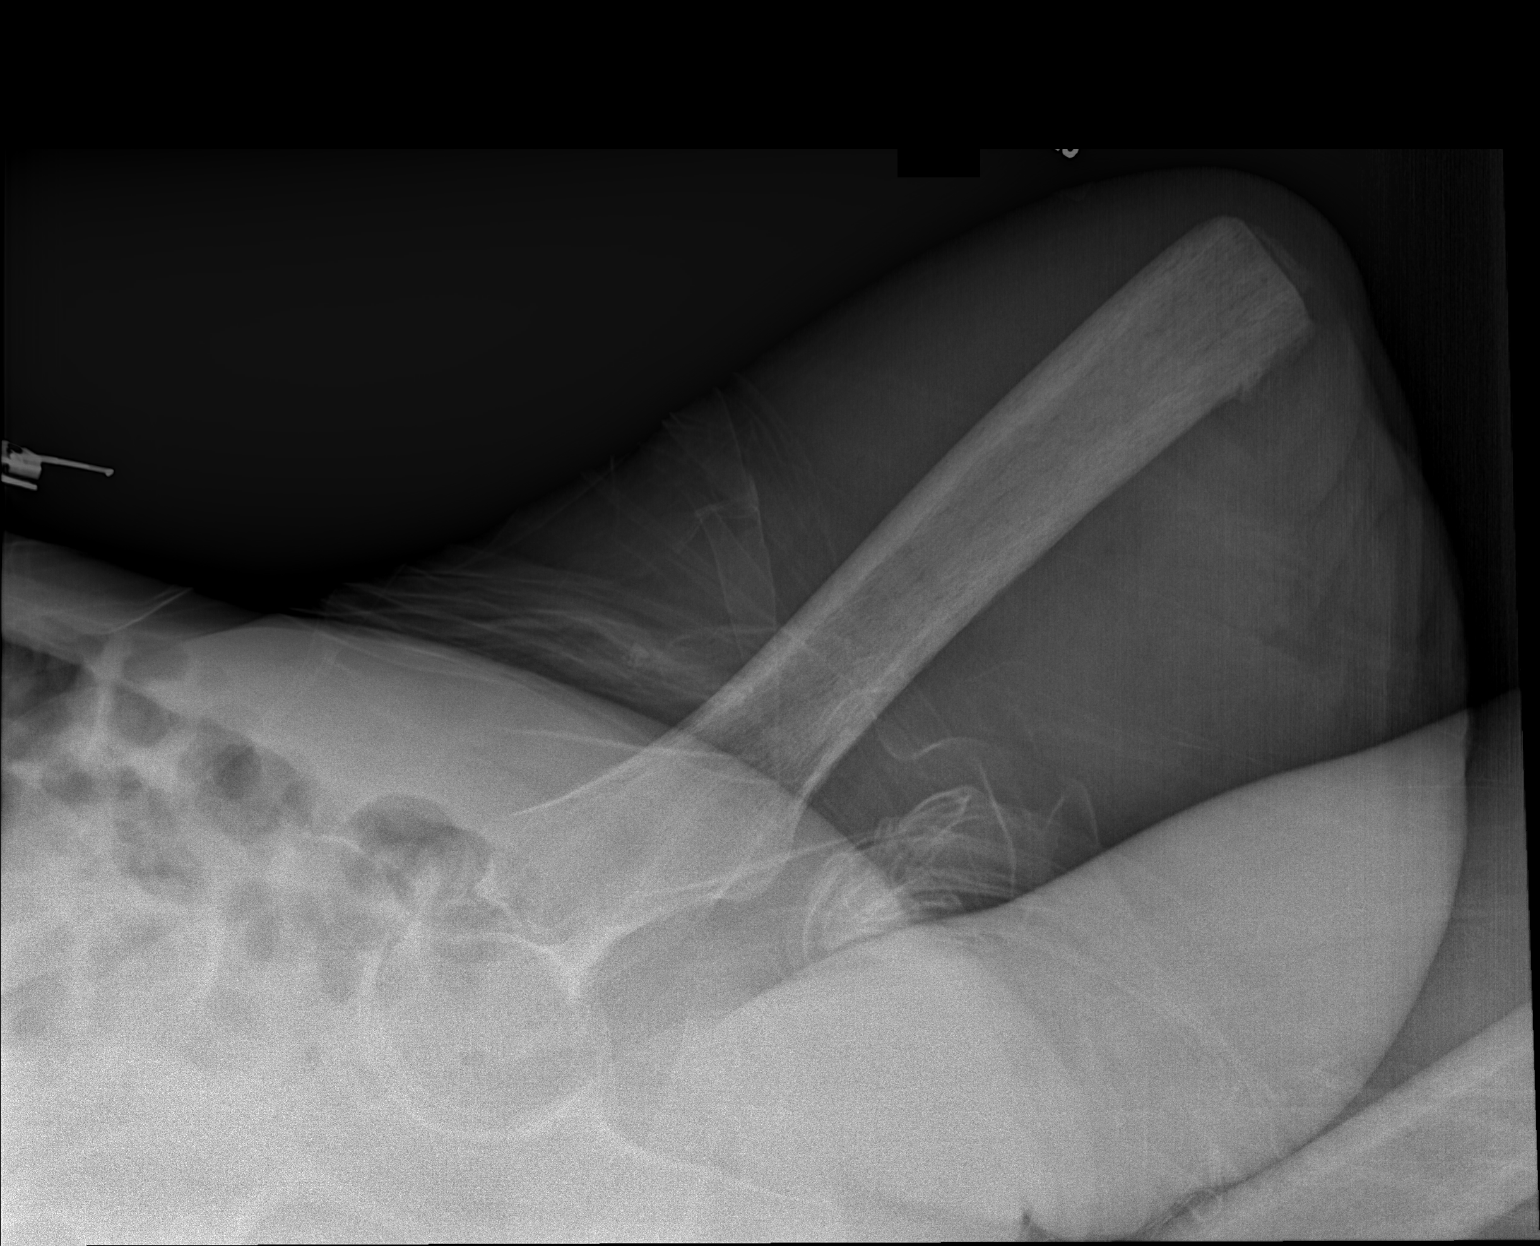

[2 of 2 positions shown; findings below may reference images not displayed]

FINDINGS: Periosteal new bone formation is noted about the tip of the
remaining left femoral diaphysis status post above knee amputation.
Findings raise concern for changes of osteomyelitis. Osteoarthritis
of the left hip with moderate-to-marked joint space narrowing is
identified. No acute fracture.
IMPRESSION: Periosteal new bone formation about the tip of the remaining femoral
diaphysis is noted. Findings raise concern for osteomyelitis.

## 2020-07-13 IMAGING — CR DG CHEST 2V
2 series · 2 of 2 positions shown · non-contrast
Comparison: 07/24/2018 and older exams.

CLINICAL DATA: Pt POLIN BILLIOT found laying on a bench at the bus stop.
Pt reports severe pain in both legs. Redness and swelling noted to
the sites on both legs. Pt has bilateral AKA amputations.
Hx of HTN and community acquired pneumonia.

EXAM:
CHEST - 2 VIEW

[w chest lat]
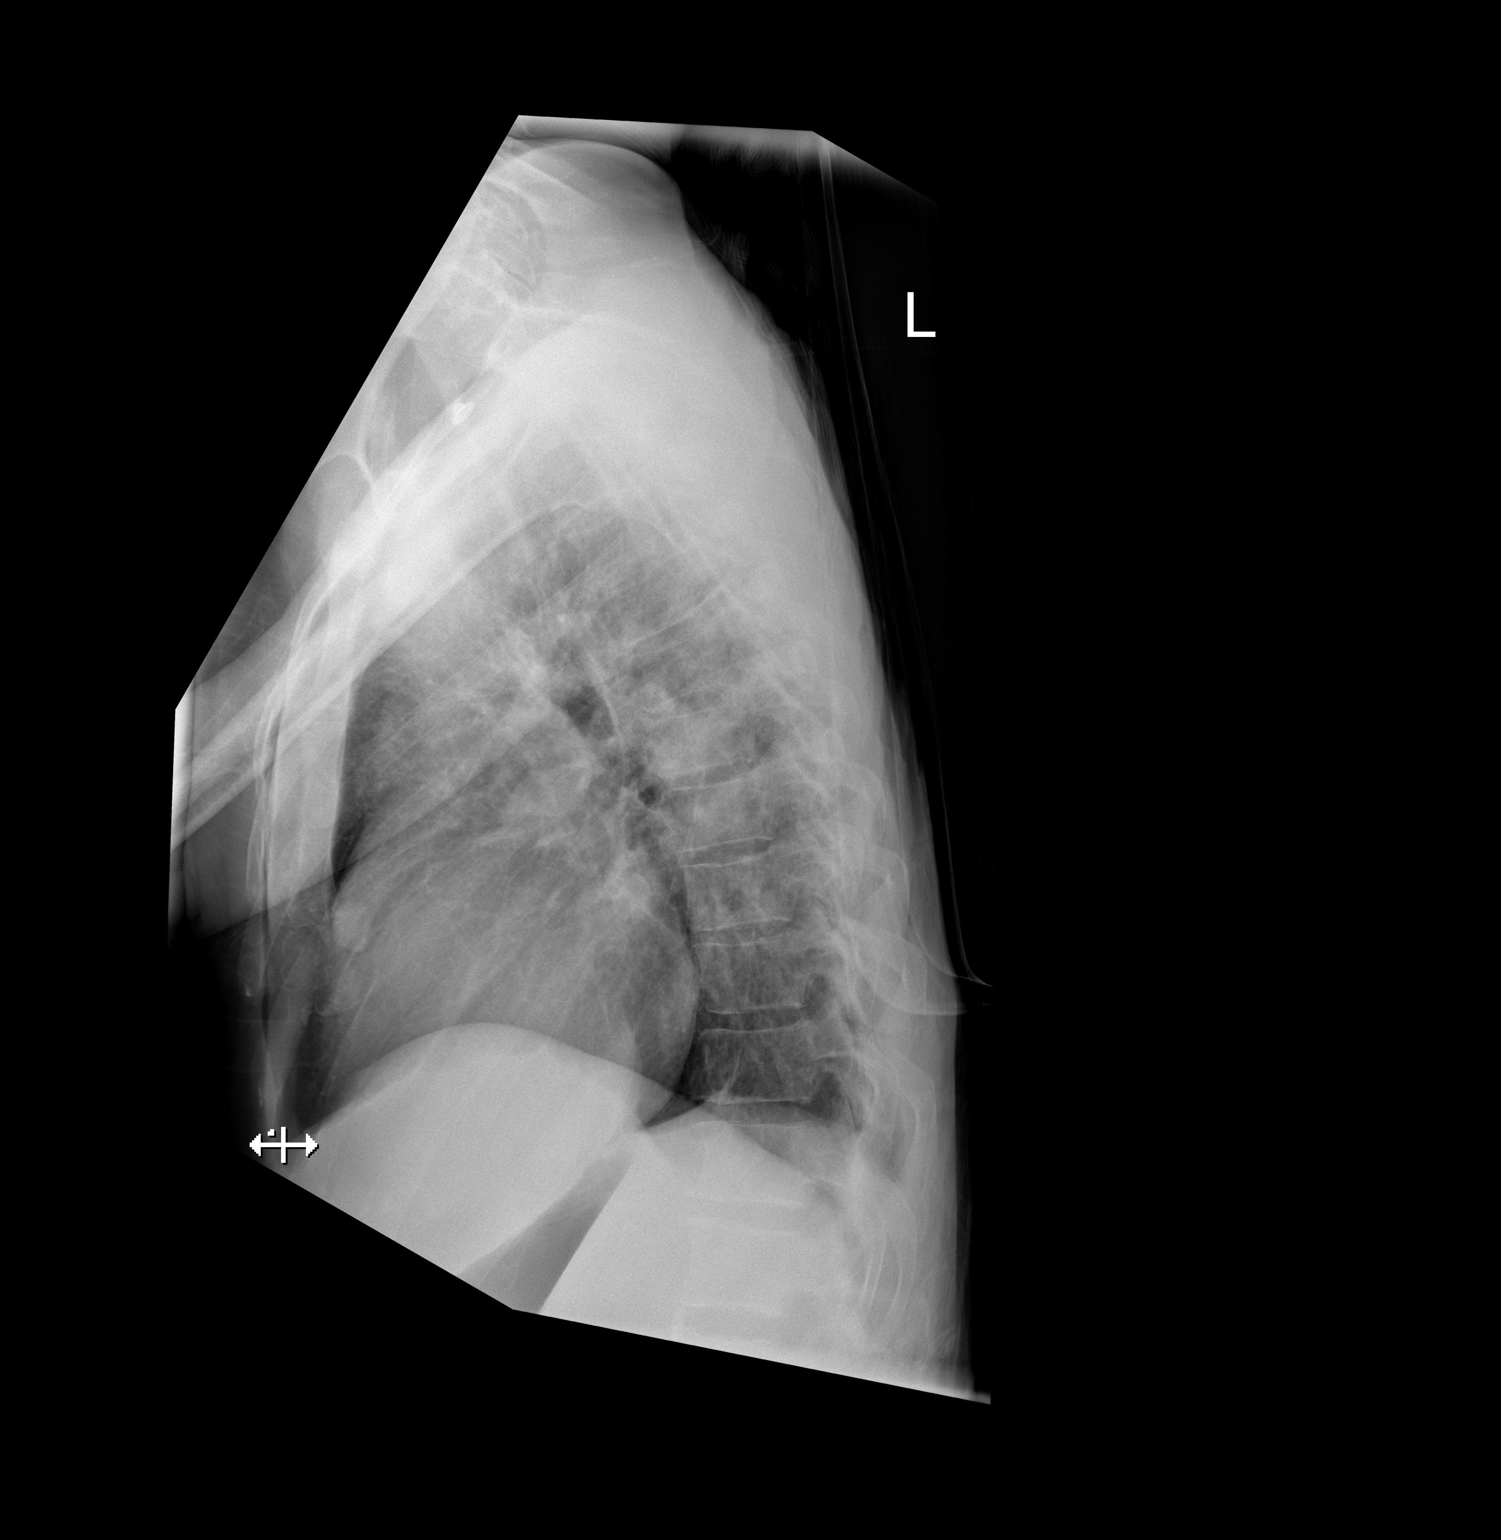

[x chest ap]
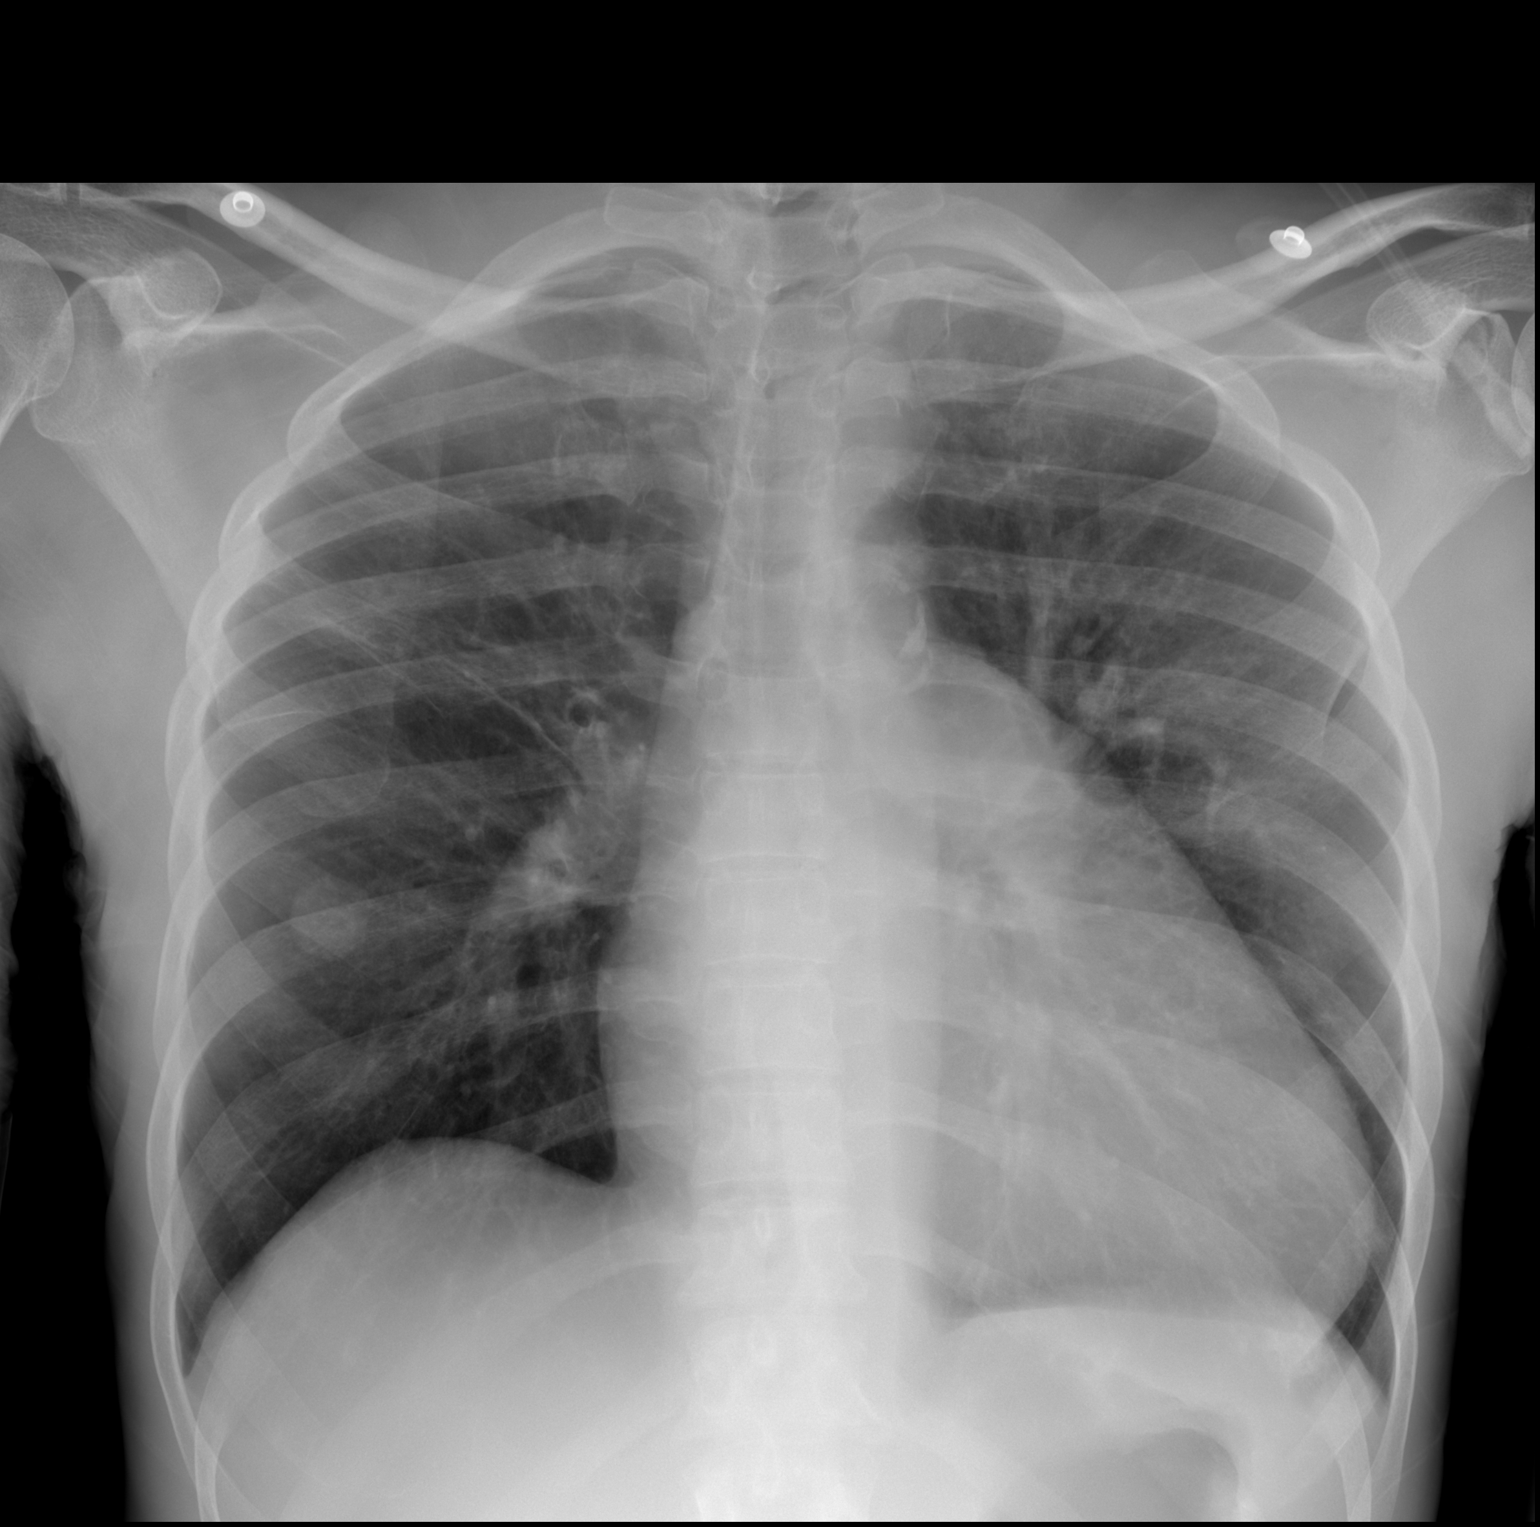

[2 of 2 positions shown; findings below may reference images not displayed]

FINDINGS: Cardiac silhouette is mildly enlarged. No mediastinal or hilar
masses. No evidence of adenopathy.

Prominent nipple shadow overlies the right mid to lower lung. Mild
linear scarring extends laterally from the right hilum, stable.
Lungs are otherwise clear. No pleural effusion or pneumothorax.

Skeletal structures are intact.
IMPRESSION: No acute cardiopulmonary disease.

## 2020-07-15 IMAGING — NM NM HEPATOBILIARY IMAGE, INC GB
1 series · 6 of 6 positions shown · non-contrast
Comparison: 07/30/2018 ultrasound.

CLINICAL DATA: 57-year-old female with generalized abdominal pain.
Gallbladder wall slightly thickened on recent ultrasound. Subsequent
encounter.

EXAM:
NUCLEAR [facility]ICINE HEPATOBILIARY IMAGING
TECHNIQUE: Sequential images of the abdomen were obtained [DATE] minutes
following intravenous administration of radiopharmaceutical.
RADIOPHARMACEUTICALS:  5.1 mCi Tc-GGm  Choletec IV

[he hepatobiliary · 4.52mm/px · 6 of 60 frames shown]
[frame 6/60]
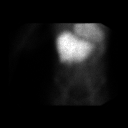
[frame 16/60]
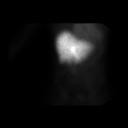
[frame 26/60]
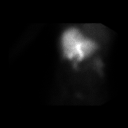
[frame 36/60]
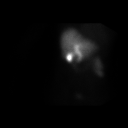
[frame 46/60]
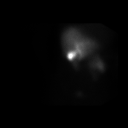
[frame 56/60]
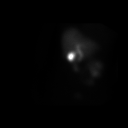

[6 of 6 positions shown; findings below may reference images not displayed]

FINDINGS: Prompt uptake and biliary excretion of activity by the liver is
seen. Gallbladder activity is visualized, consistent with patency of
cystic duct. Biliary activity passes into small bowel, consistent
with patent common bile duct.
IMPRESSION: Gallbladder and small bowel visualized consistent with patent cystic
duct and patent common bile duct respectively.

## 2020-07-27 IMAGING — DX DG FEMUR 1V PORT*L*
1 series · 1 of 1 positions shown · non-contrast
Comparison: Recent radiograph from 07/30/2018.

CLINICAL DATA: Initial evaluation for acute fever.

EXAM:
LEFT FEMUR PORTABLE 1 VIEW

[femur]
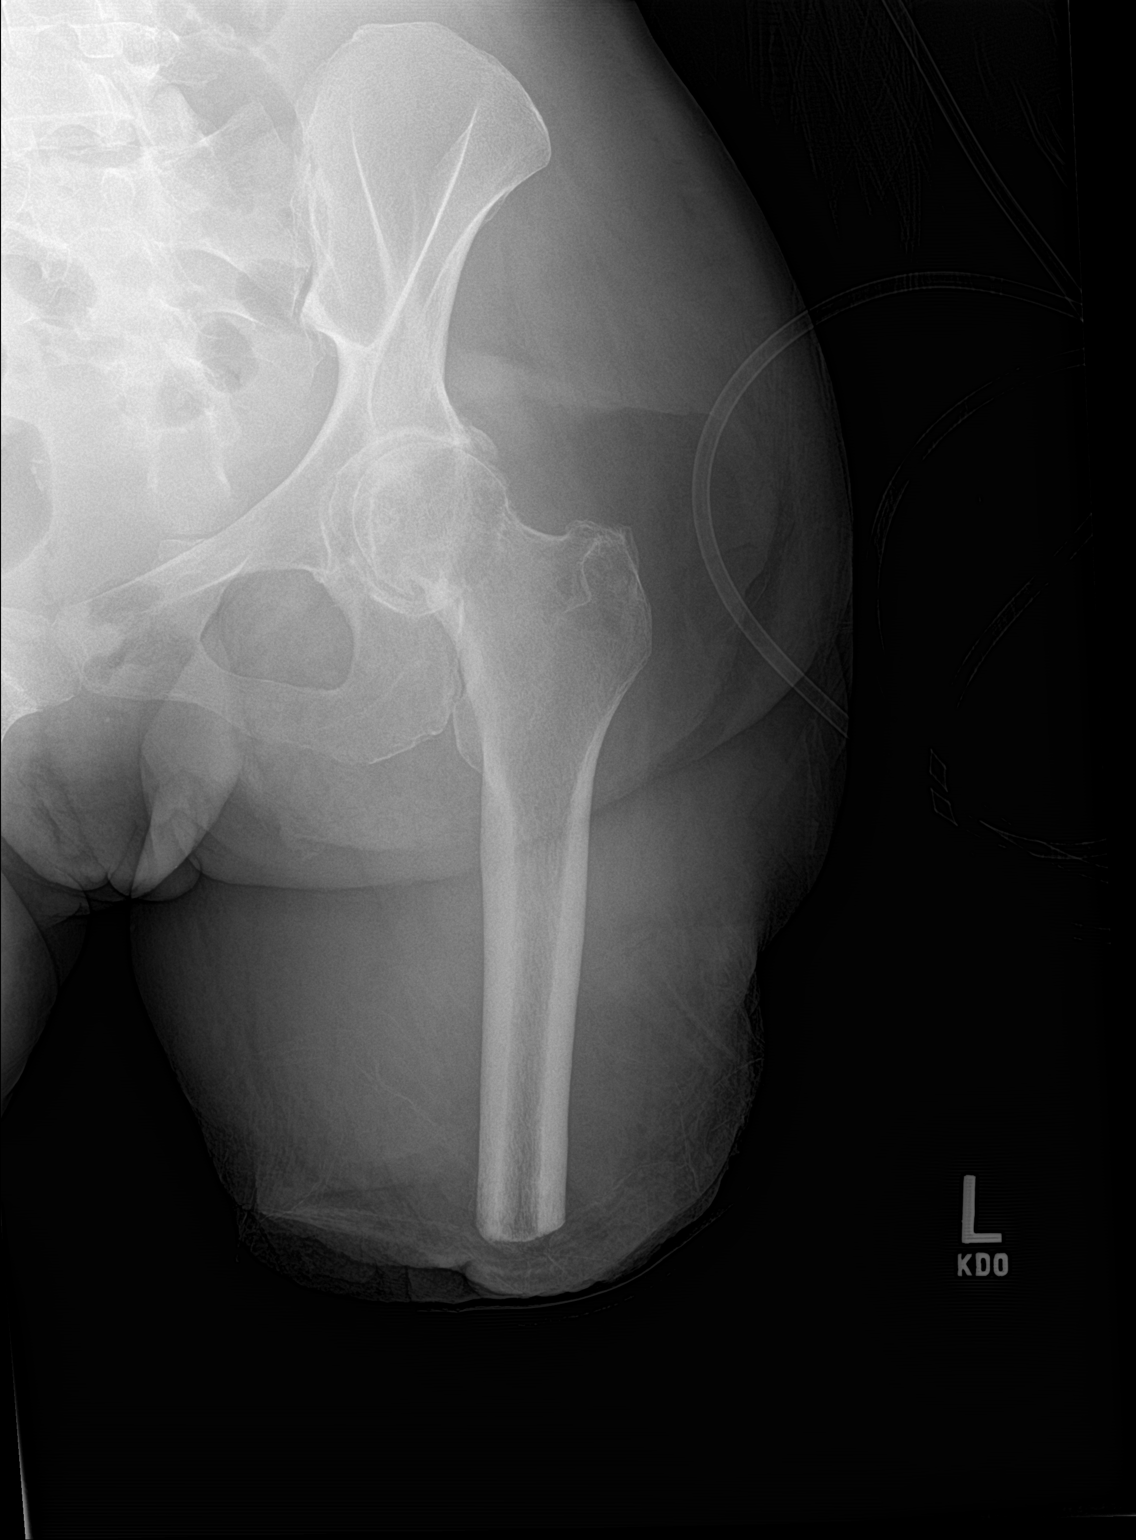

[1 of 1 positions shown; findings below may reference images not displayed]

FINDINGS: Patient status post AKA. Subtle periosteal reaction along the
amputated margin of the left femur, similar to previous exam.
Additionally, there is subtle osseous erosion at the amputated
margin, not definitely seen on previous, suspicious for possible
acute osteomyelitis. Overlying soft tissue irregularity without soft
tissue emphysema. No acute fracture or dislocation. Osteoarthritic
changes noted about the left hip.
IMPRESSION: Subtle periosteal reaction with osseous erosion at the amputated
margin of the left femoral AKA, suspicious for possible acute
osteomyelitis. Further evaluation and confirmation could be
performed with follow-up MRI as clinically warranted.

## 2020-07-27 IMAGING — DX DG CHEST 1V PORT
1 series · 1 of 1 positions shown · non-contrast
Comparison: Prior radiograph from 07/30/2018.

CLINICAL DATA: Initial evaluation for acute fever.

EXAM:
PORTABLE CHEST 1 VIEW

[chest]
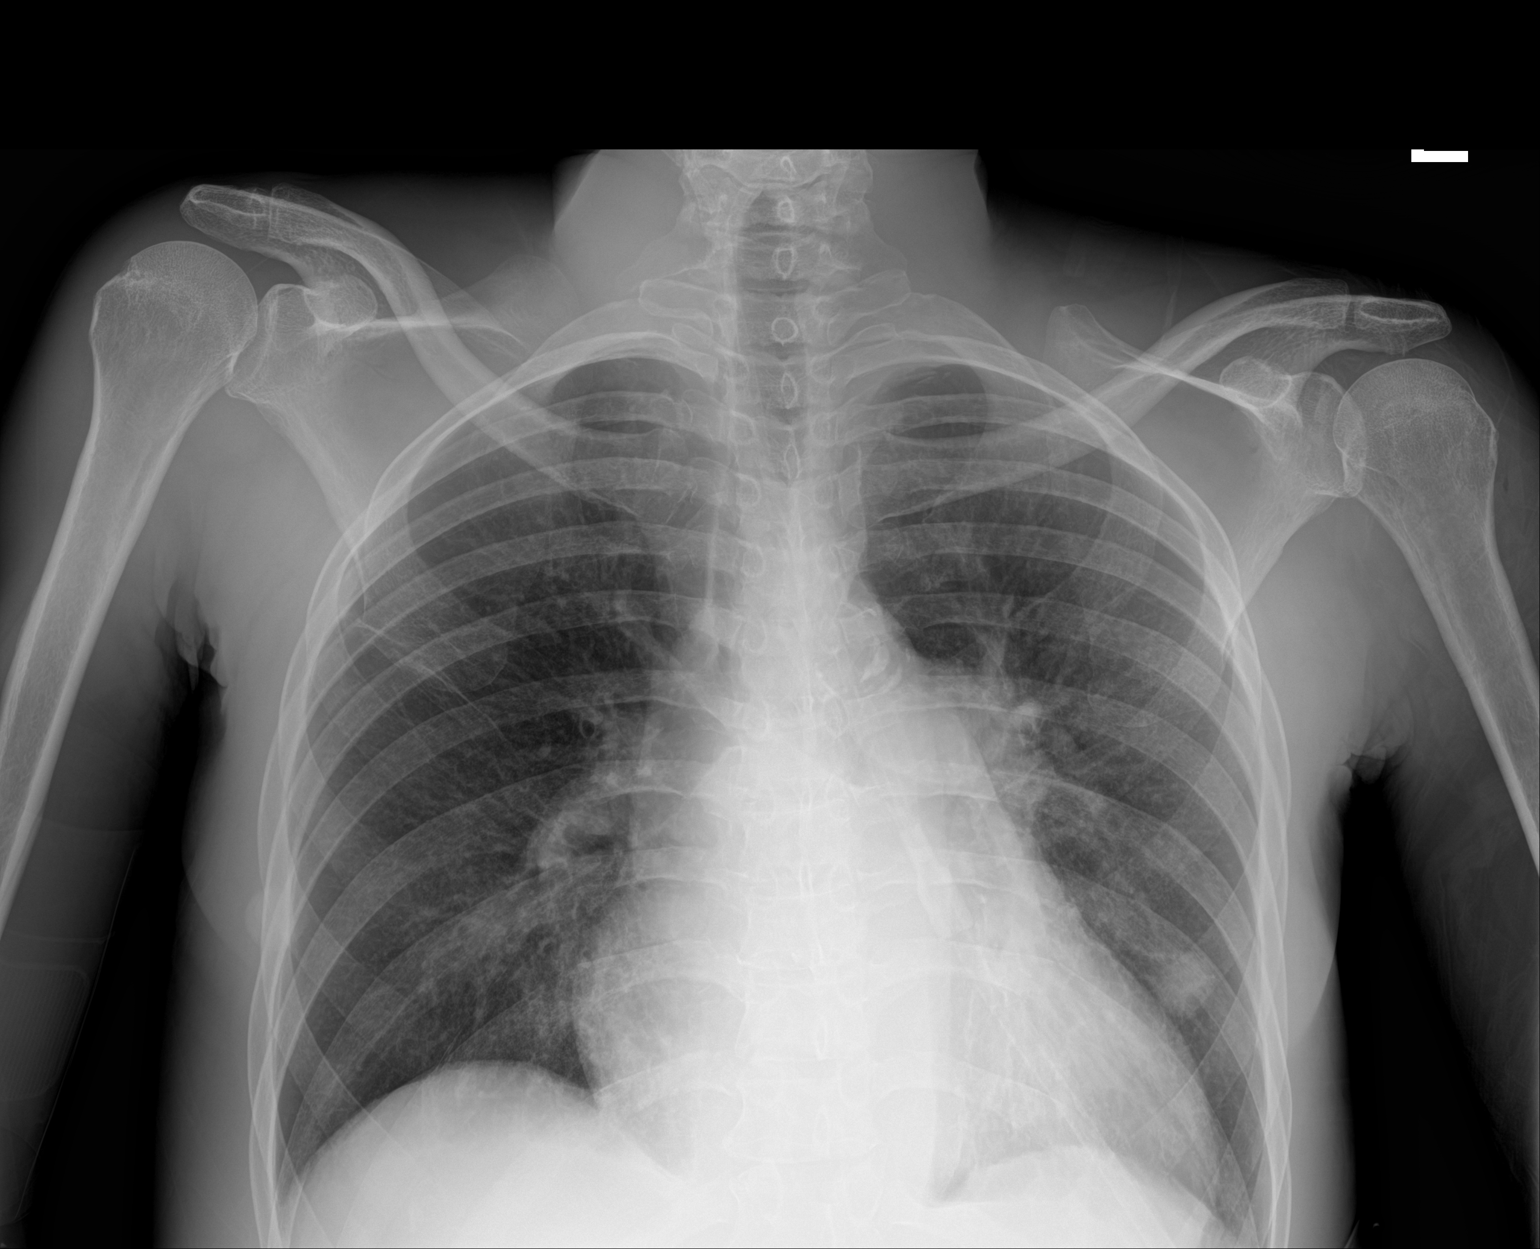

[1 of 1 positions shown; findings below may reference images not displayed]

FINDINGS: Cardiomegaly, unchanged. Mediastinal silhouette within normal
limits. Aortic atherosclerosis.

Lungs mildly hypoinflated. Mild diffuse pulmonary interstitial
edema. No appreciable pleural effusion. 19 mm nodular density
overlying the mid-lower left lung most likely reflects a nipple
shadow. Similar shadow seen overlying the lateral margin of the
right hemithorax. No other focal infiltrates. No pneumothorax.

No acute osseous abnormality.
IMPRESSION: 1. Cardiomegaly with mild diffuse pulmonary interstitial
congestion/edema.
2. No other active cardiopulmonary disease.  No focal infiltrates.
3. Nodular densities overlying the bilateral lung bases, most
consistent with nipple shadows.
4. Aortic atherosclerosis.

## 2020-07-27 IMAGING — DX DG FEMUR 1V PORT*R*
1 series · 1 of 1 positions shown · non-contrast
Comparison: Prior radiograph from 08/09/2018.

CLINICAL DATA: Initial evaluation for acute fever, evaluate for
infection.

EXAM:
RIGHT FEMUR PORTABLE 1 VIEW

[femur]
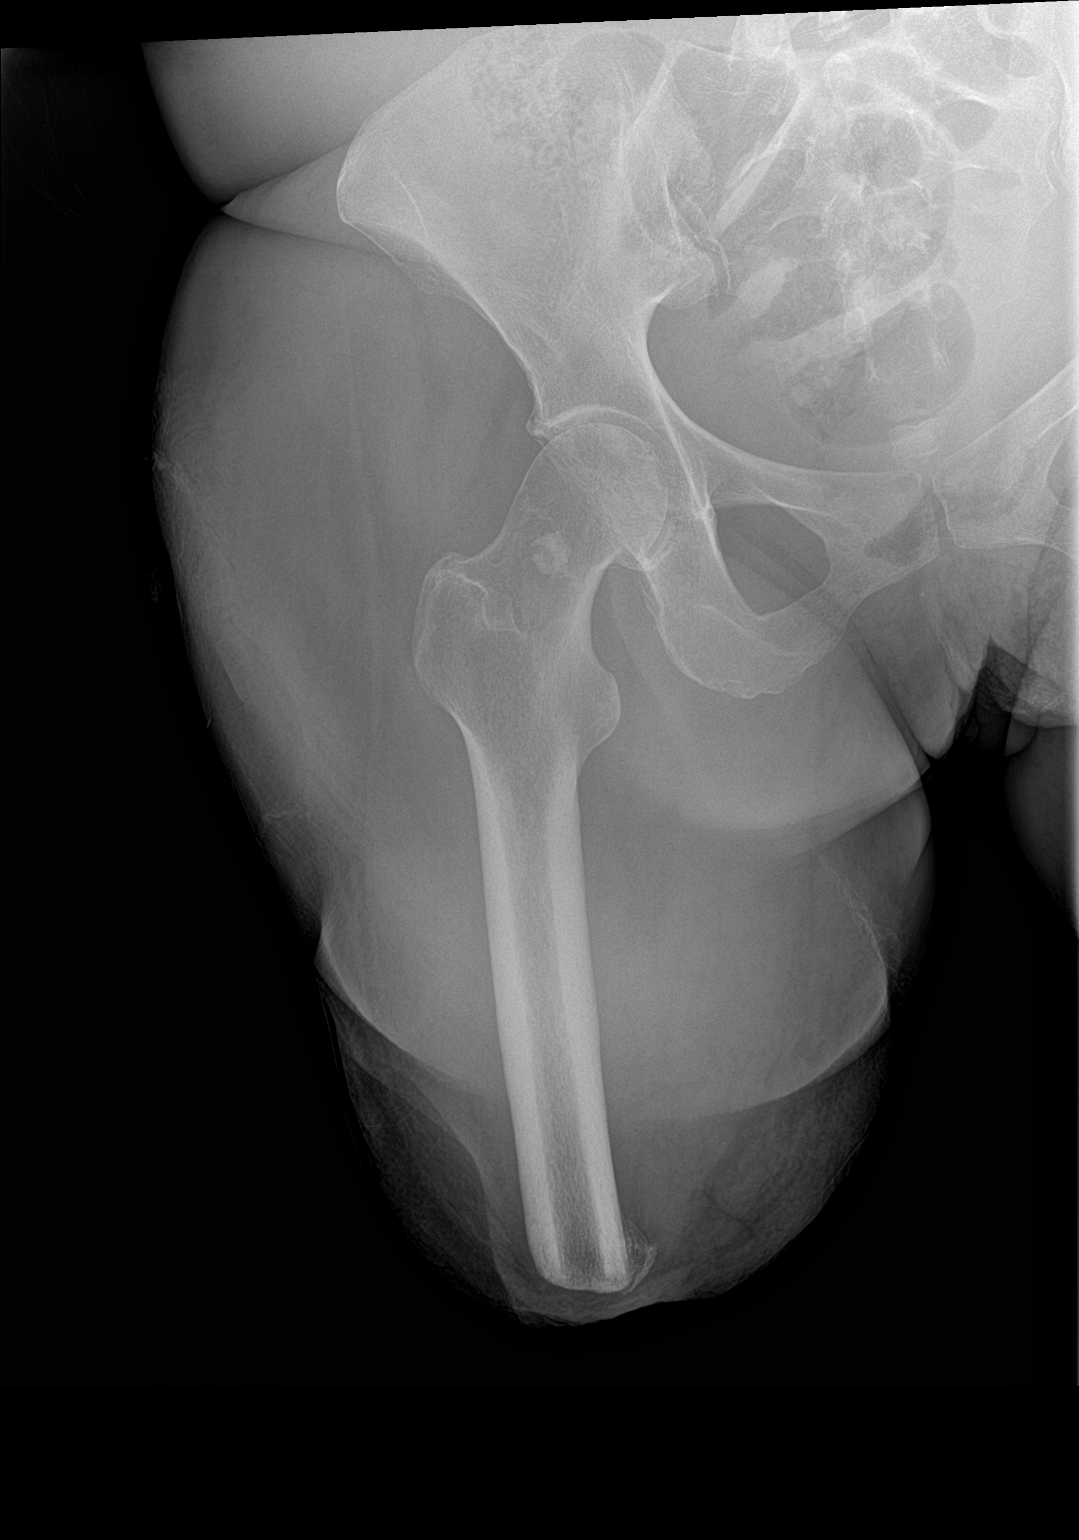

[1 of 1 positions shown; findings below may reference images not displayed]

FINDINGS: Patient is status post AKA. Subtle periosteal reaction along the
medial margin of the amputated femur again seen, stable from
previous. No interval osseous erosion. Mild overlying soft tissue
irregularity. No radiopaque foreign body or soft tissue emphysema.
No acute fracture or dislocation. Sclerotic lesion within the right
femoral neck noted, unchanged.
IMPRESSION: Similar appearance of right AKA with subtle periosteal reaction at
the distal right femur, stable relative to recent radiograph from
08/09/2018. No interval osseous erosion or definite evidence for
acute osteomyelitis. Again, further evaluation with dedicated MRI
could be performed if there is clinical concern for ongoing
osteomyelitis.

## 2020-07-29 IMAGING — DX DG CHEST 1V PORT
1 series · 1 of 1 positions shown · non-contrast
Comparison: 08/13/2018

CLINICAL DATA: Follow-up pneumonia

EXAM:
PORTABLE CHEST 1 VIEW

[chest ap]
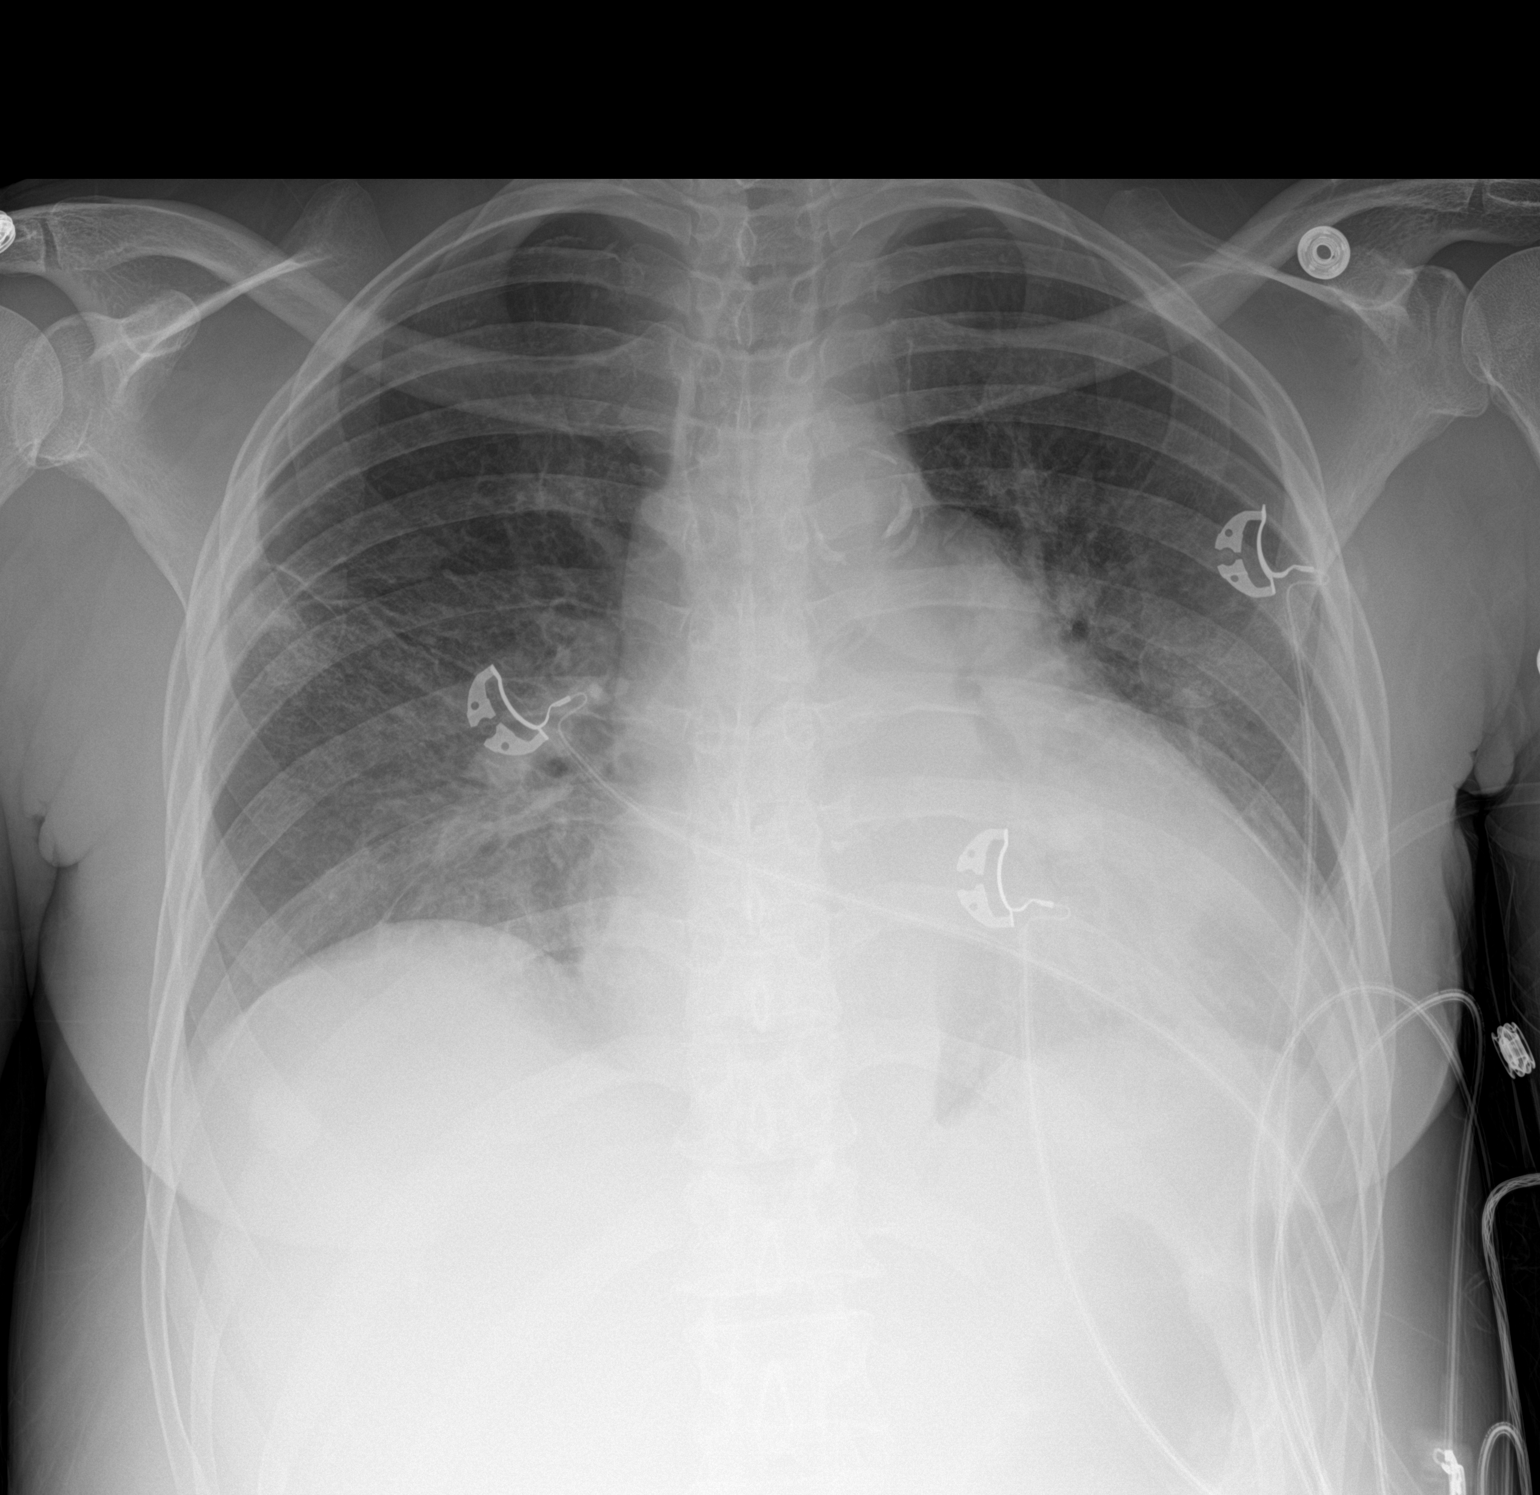

[1 of 1 positions shown; findings below may reference images not displayed]

FINDINGS: Cardiac shadow remains enlarged. Aortic calcifications are again
seen. The lungs are well aerated bilaterally. Some patchy
atelectatic changes are noted in the right mid lung and left lung
base slightly increased when compared with the prior exam.
Previously seen nipple shadows are visualized more inferiorly. No
sizable effusion is seen. No bony abnormality is noted.
IMPRESSION: Patchy bilateral atelectasis.

## 2020-07-30 IMAGING — CT CT HEAD W/O CM
4 series · 17 of 47 positions shown, 19 images · non-contrast
Comparison: CT HEAD February 16, 2018

CLINICAL DATA: Altered level of consciousness. History of
migraines, hypertension and cocaine abuse.

EXAM:
CT HEAD WITHOUT CONTRAST
TECHNIQUE: Contiguous axial images were obtained from the base of the skull
through the vertex without intravenous contrast.

[Series 3: head without · axial · non-contrast · 0.40mm/px · z∈[-153,-48]mm · 7 of 29 slices shown, 9 images]
[im 4/29  brain]
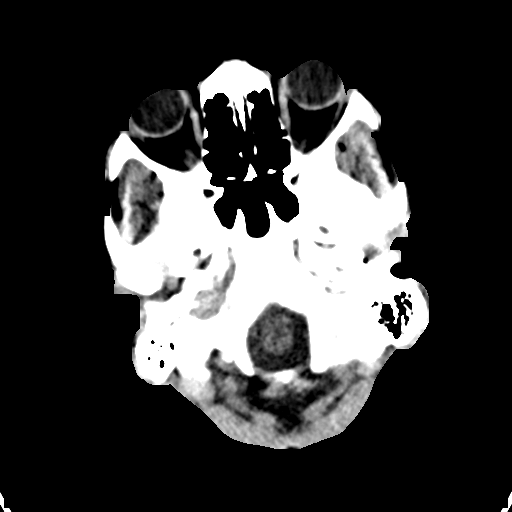
[im 4/29  bone]
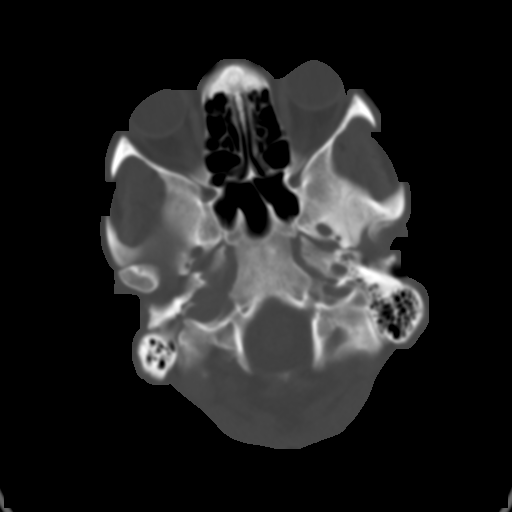
[im 8/29  brain]
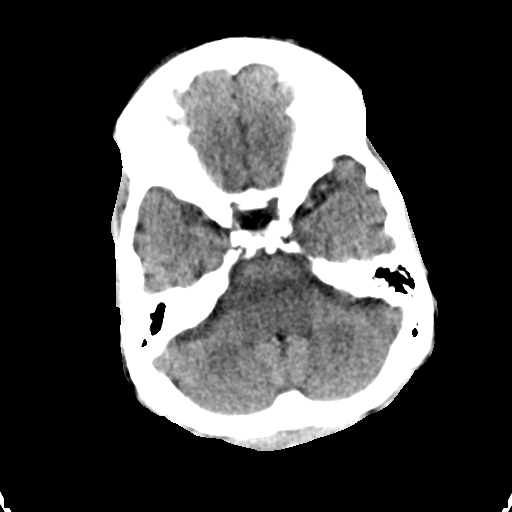
[im 11/29  brain]
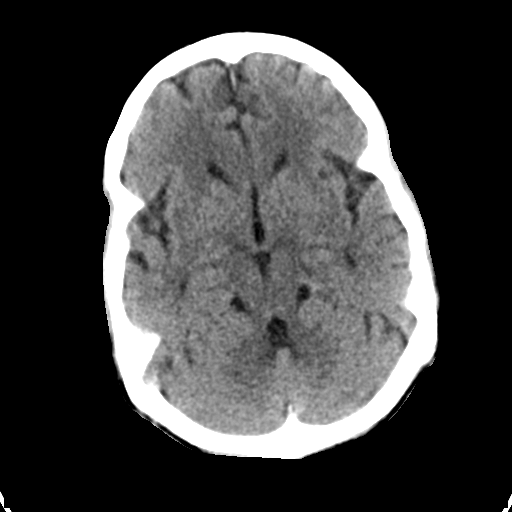
[im 15/29  brain]
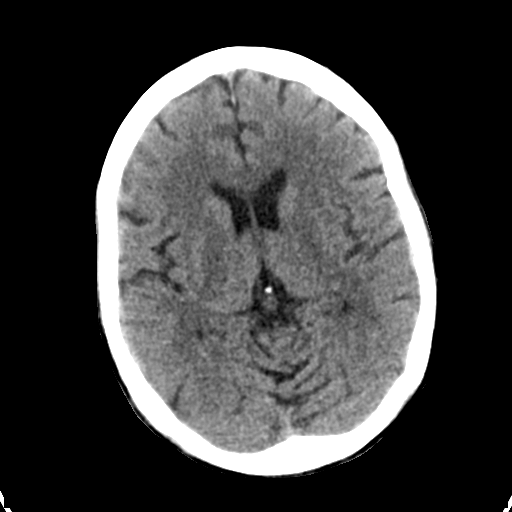
[im 18/29  brain]
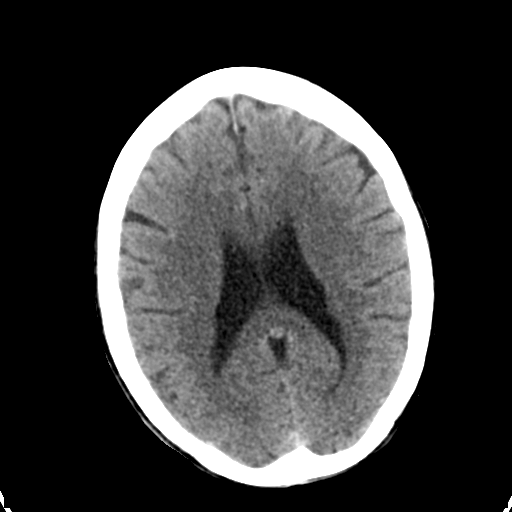
[im 18/29  bone]
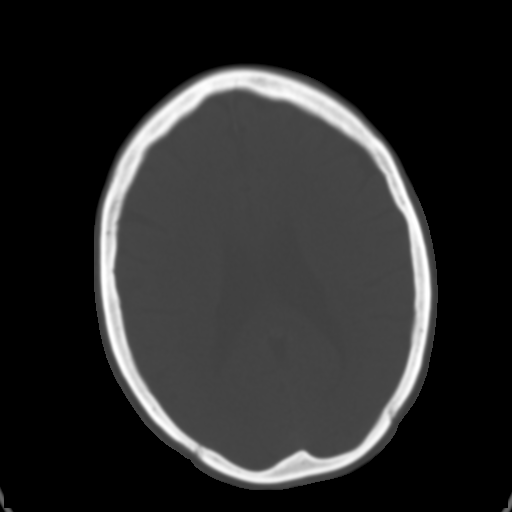
[im 22/29  brain]
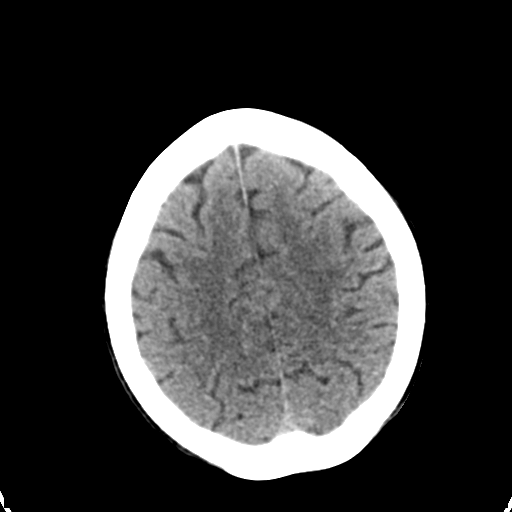
[im 25/29  brain]
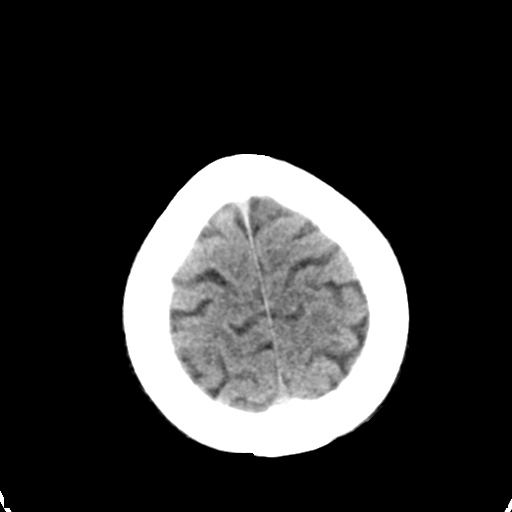

[Series 4: head bone · axial · 0.40mm/px · z∈[-154,-104]mm · 4 of 73 slices shown]
[im 8/73  bone]
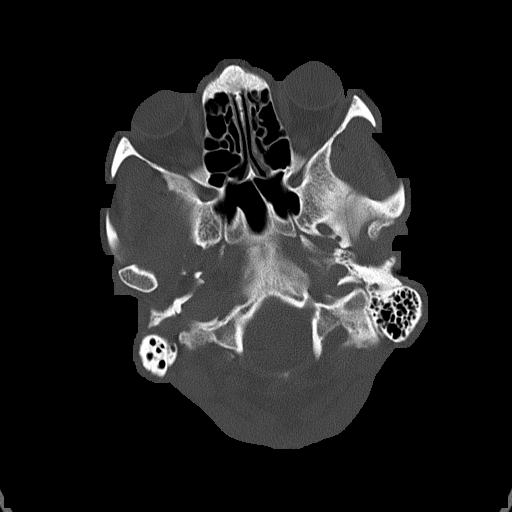
[im 15/73  bone]
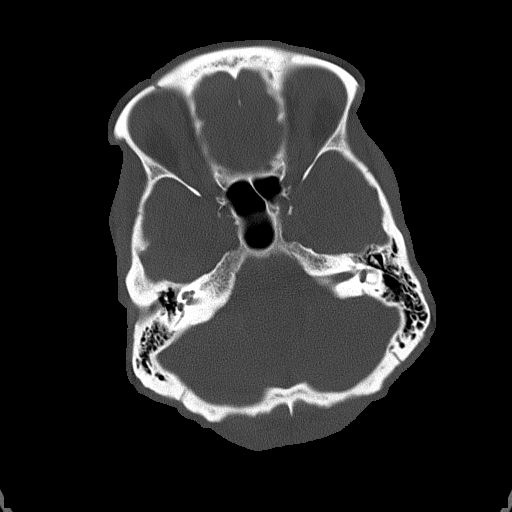
[im 22/73  bone]
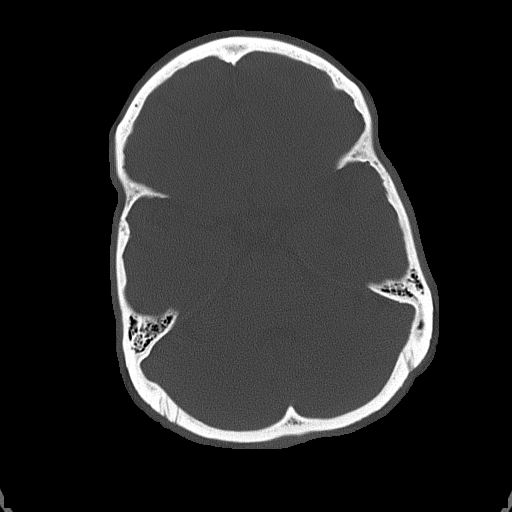
[im 33/73  bone]
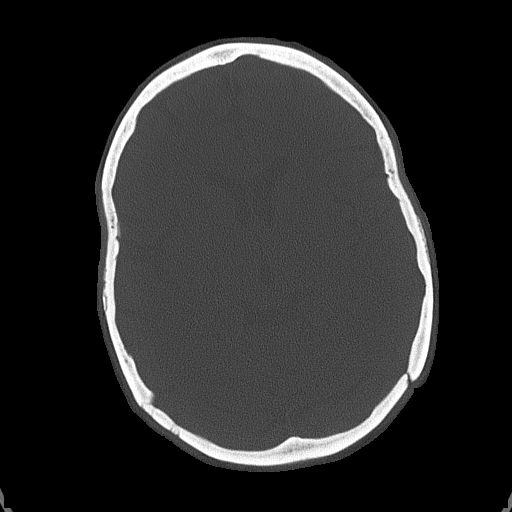

[Series 5: head without cor · coronal · non-contrast · 0.30mm/px · 3 of 67 slices shown]
[im 23/67  brain]
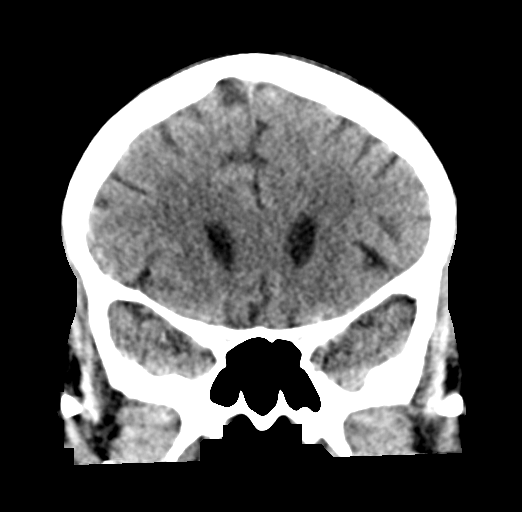
[im 30/67  brain]
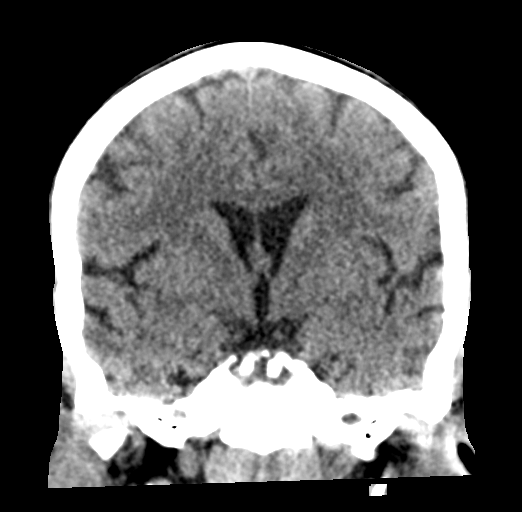
[im 37/67  brain]
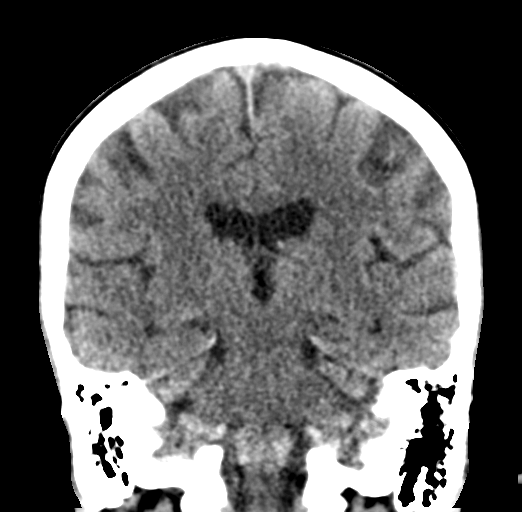

[Series 6: head without sag · sagittal · non-contrast · 0.30mm/px · 3 of 55 slices shown]
[im 19/55  brain]
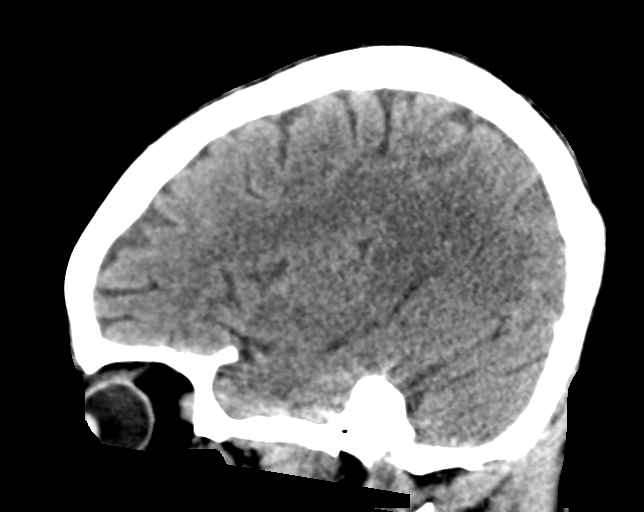
[im 28/55  brain]
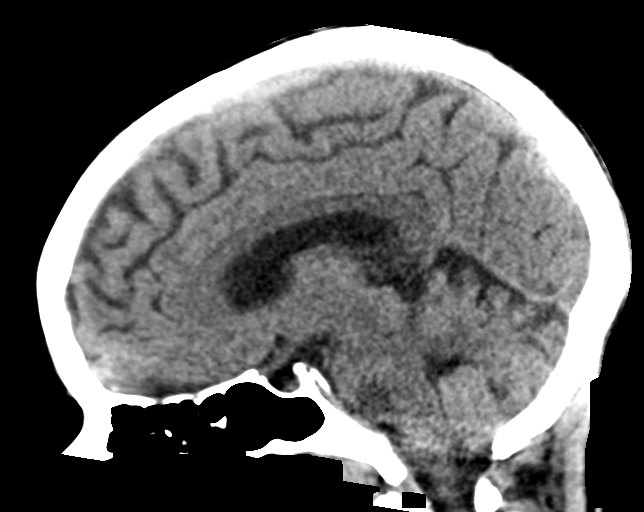
[im 37/55  brain]
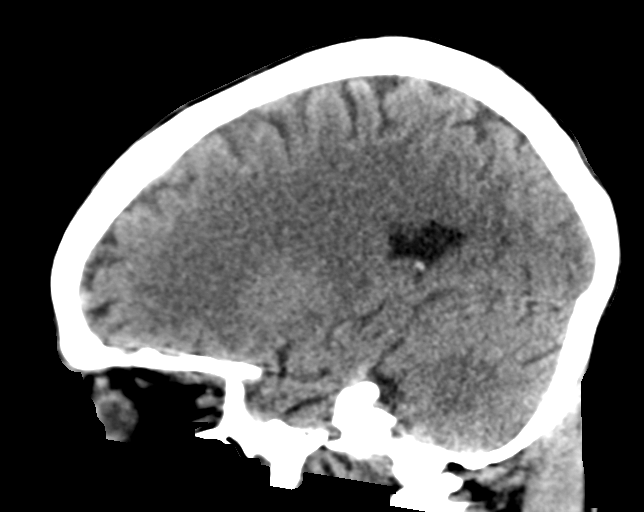

[17 of 47 positions shown; findings below may reference images not displayed]

FINDINGS: BRAIN: No intraparenchymal hemorrhage, mass effect nor midline
shift. Borderline parenchymal brain volume loss for age. No acute
large vascular territory infarcts. No abnormal extra-axial fluid
collections. Basal cisterns are patent.

VASCULAR: Moderate calcific atherosclerosis carotid siphon.

SKULL/SOFT TISSUES: No skull fracture. No significant soft tissue
swelling.

ORBITS/SINUSES: The included ocular globes and orbital contents are
normal.Trace paranasal sinus mucosal thickening. Mastoid air cells
are well aerated.

OTHER: Patient is edentulous.
IMPRESSION: 1. No acute intracranial process.
2. Borderline parenchymal brain volume loss for age.
3. Advanced moderate intracranial atherosclerosis.

## 2020-09-15 IMAGING — DX DG CHEST 2V
2 series · 2 of 2 positions shown · non-contrast
Comparison: Chest radiograph performed 08/20/2018

CLINICAL DATA: Acute onset of central chest pain and cough.

EXAM:
CHEST - 2 VIEW

[chest lat]
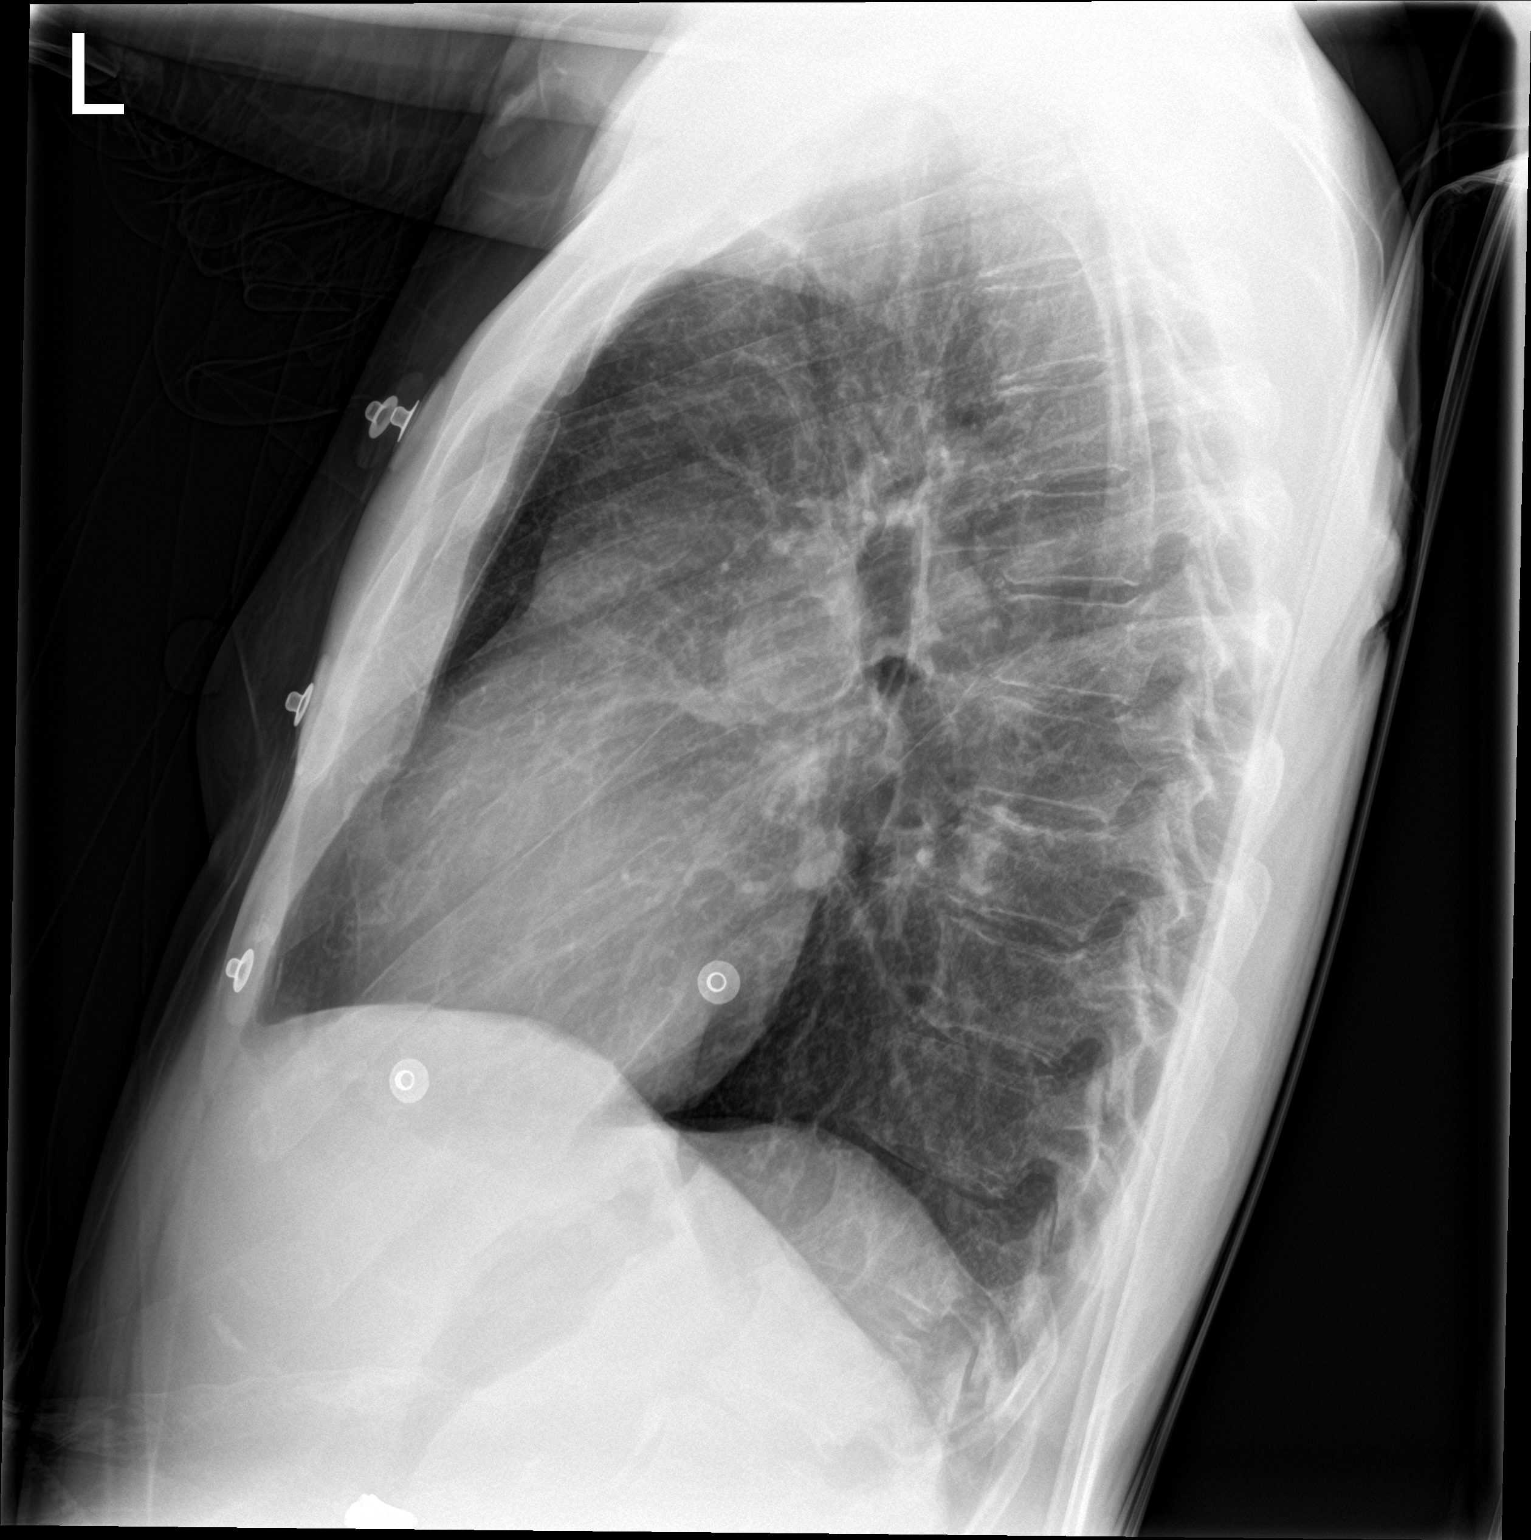

[chest ap]
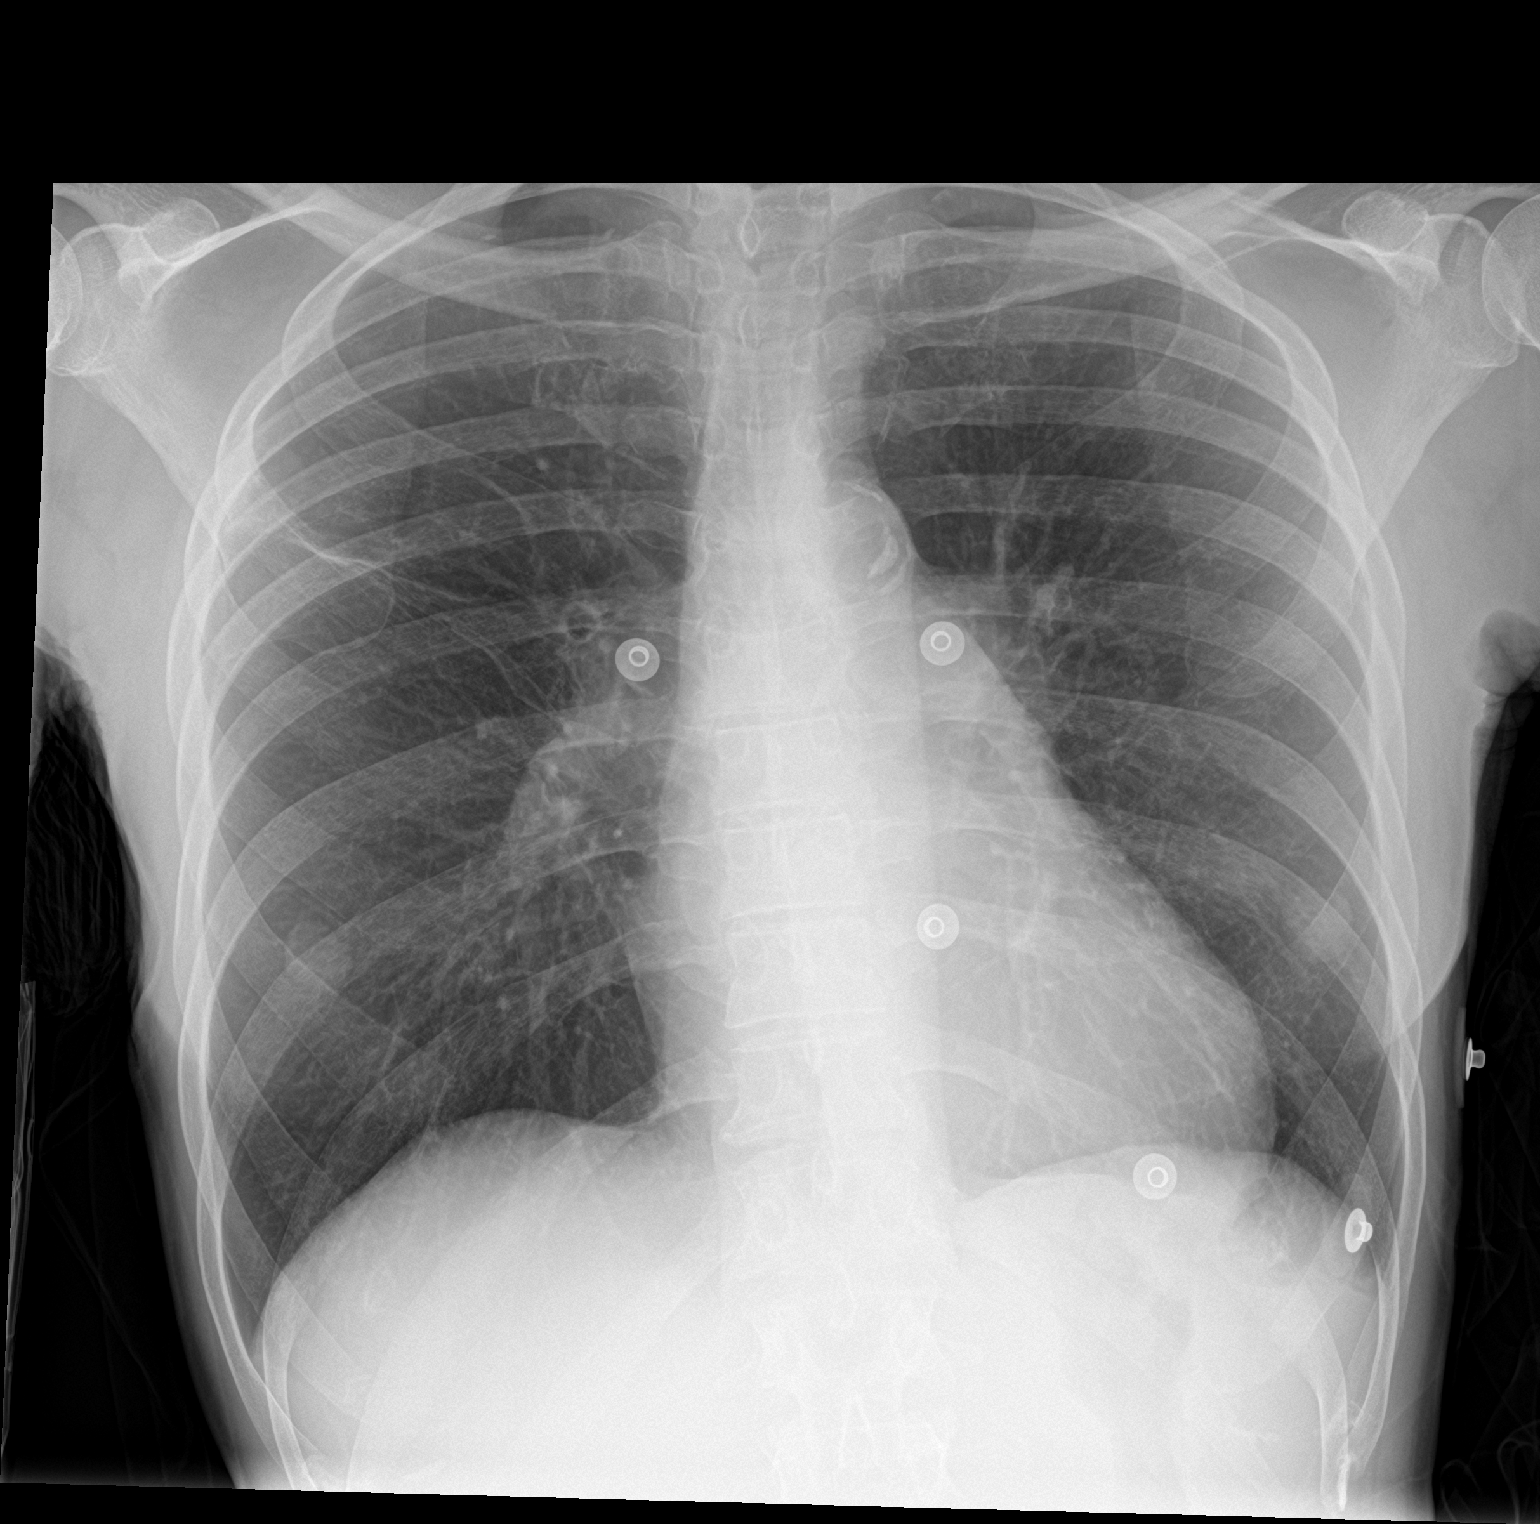

[2 of 2 positions shown; findings below may reference images not displayed]

FINDINGS: The lungs are well-aerated. Mild scarring is noted at the right
midlung zone. Mild peribronchial thickening is noted. There is no
evidence of focal opacification, pleural effusion or pneumothorax.
Bilateral nipple shadows are noted.

The heart is normal in size; the mediastinal contour is within
normal limits. No acute osseous abnormalities are seen.
IMPRESSION: Mild peribronchial thickening noted. Lungs otherwise grossly clear.

## 2021-09-01 IMAGING — CT CT ABD-PELV W/O CM
2 of 4 series · 15 of 46 positions shown, 17 images · non-contrast
Comparison: CT abdomen pelvis 07/29/2018

CLINICAL DATA: Postoperative abdominal pain and fever, recent lower
extremity amputation

EXAM:
CT ABDOMEN AND PELVIS WITHOUT CONTRAST
TECHNIQUE: Multidetector CT imaging of the abdomen and pelvis was performed
following the standard protocol without IV contrast.

[Series 2: axial st · axial · 0.62mm/px · z∈[+154,+499]mm · 12 of 79 slices shown, 14 images]
[im 5/79  soft-tissue]
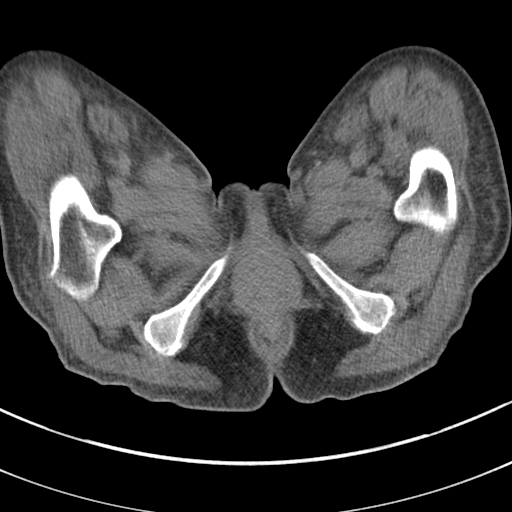
[im 5/79  bone]
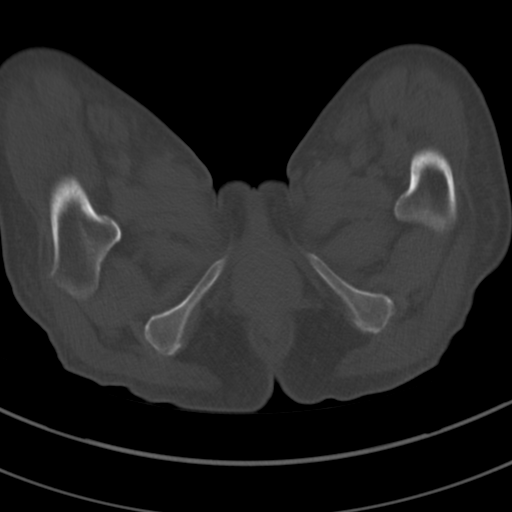
[im 14/79  soft-tissue]
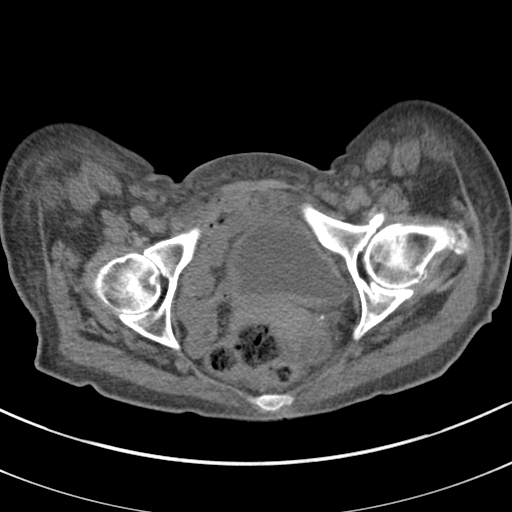
[im 18/79  soft-tissue]
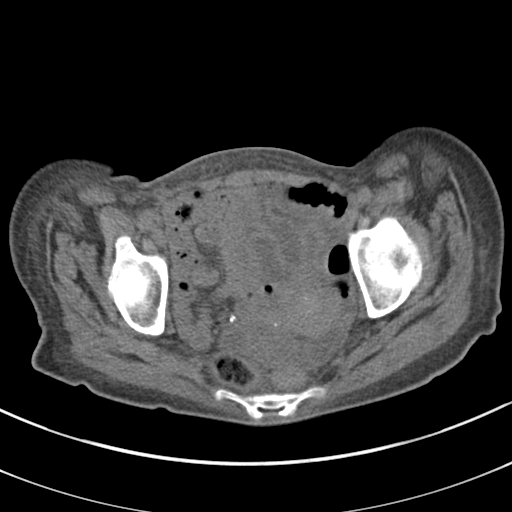
[im 22/79  soft-tissue]
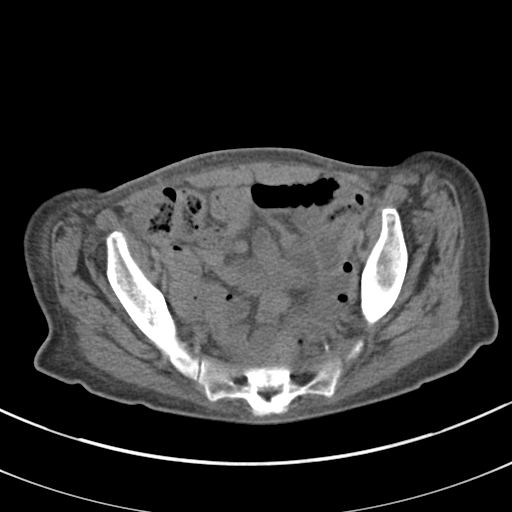
[im 31/79  soft-tissue]
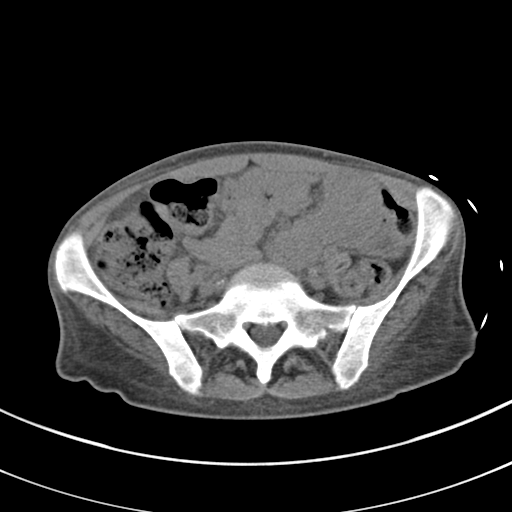
[im 35/79  soft-tissue]
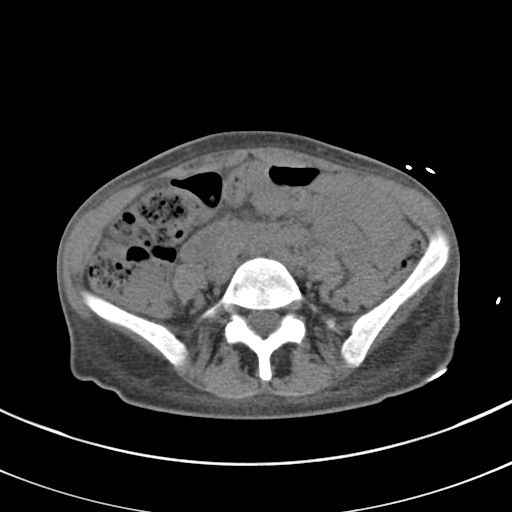
[im 44/79  soft-tissue]
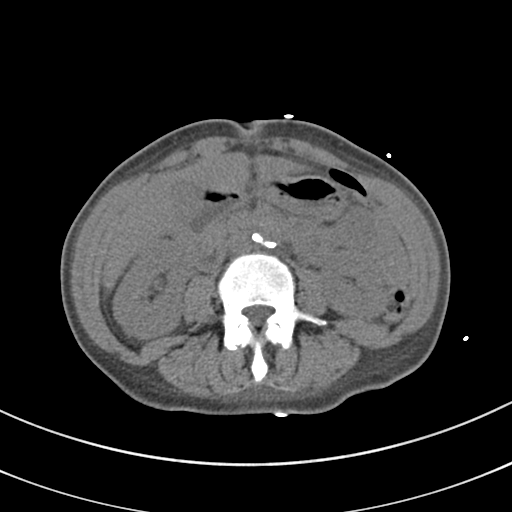
[im 48/79  soft-tissue]
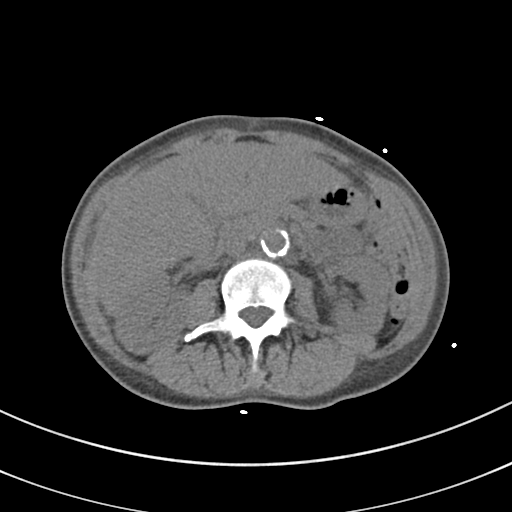
[im 57/79  soft-tissue]
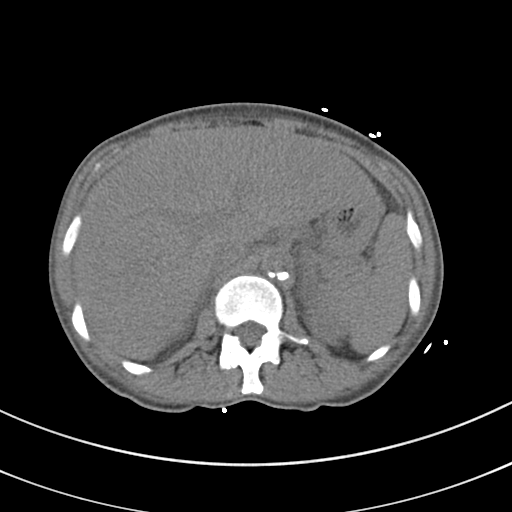
[im 57/79  bone]
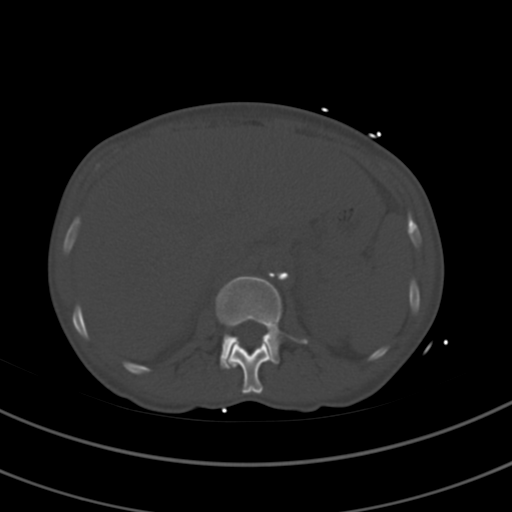
[im 61/79  soft-tissue]
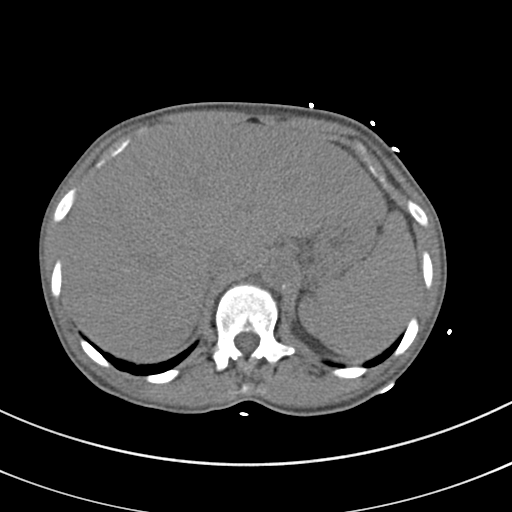
[im 66/79  soft-tissue]
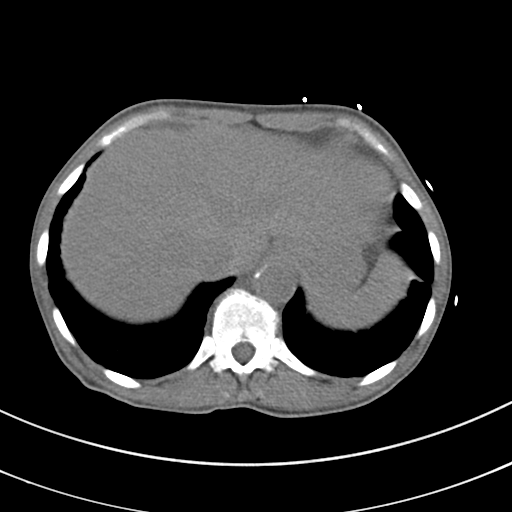
[im 74/79  soft-tissue]
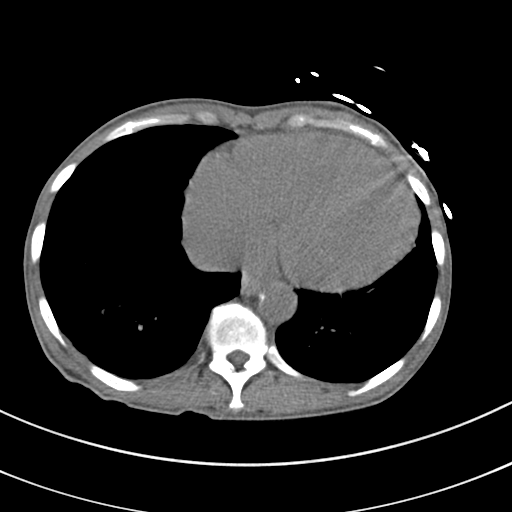

[Series 5: coronal st · coronal · 0.58mm/px · 3 of 105 slices shown]
[im 35/105  soft-tissue]
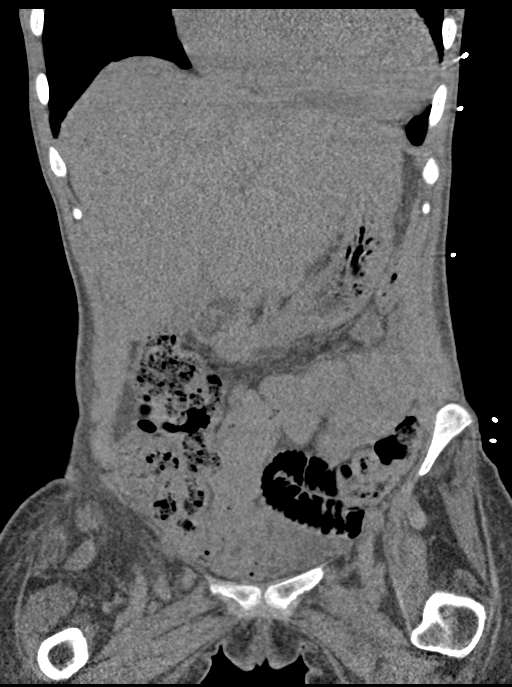
[im 47/105  soft-tissue]
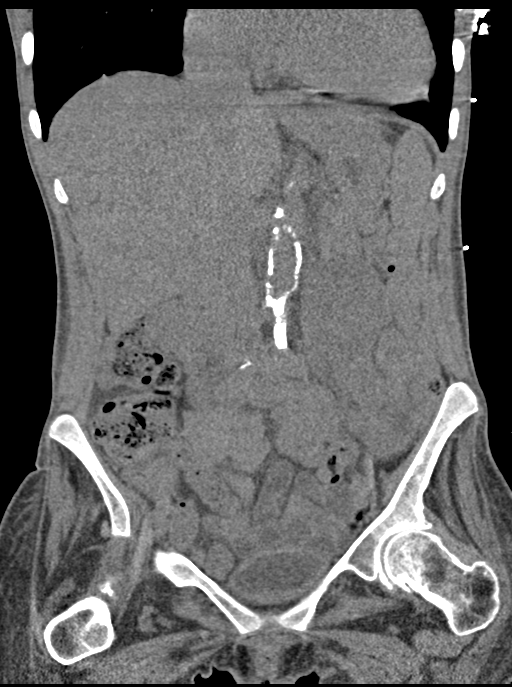
[im 58/105  soft-tissue]
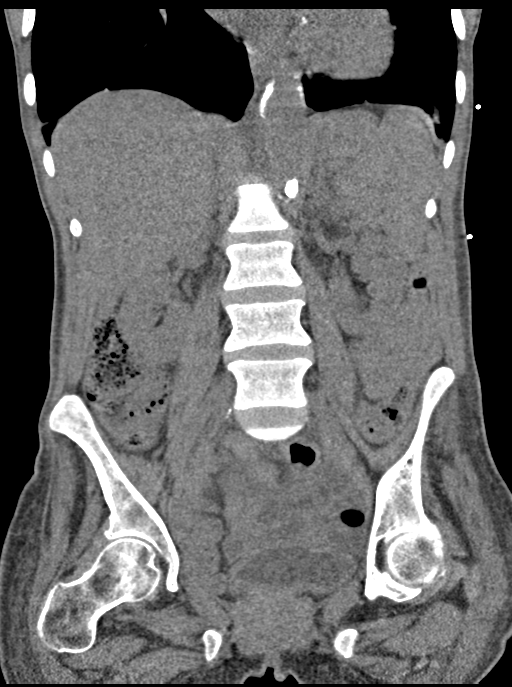

[15 of 46 positions shown; findings below may reference images not displayed]

FINDINGS: Lower chest: Bandlike areas of scarring in the lung bases with
stable emphysematous change. No consolidation. No effusion. Marked
cardiomegaly and coronary atherosclerosis.

Hepatobiliary: Mild hepatomegaly. No visible hepatic lesions. No
pericholecystic fluid or inflammation. No biliary ductal dilatation.

Pancreas: Unremarkable. No pancreatic ductal dilatation or
surrounding inflammatory changes.

Spleen: Normal in size without focal abnormality.

Adrenals/Urinary Tract: Mild thickening of the adrenal glands
similar to priors. Adrenal glands are otherwise unremarkable.
Kidneys are grossly normal, without renal calculi, focal lesion, or
hydronephrosis. Circumferential thickening of the urinary bladder.

Stomach/Bowel: Distal esophagus, stomach and duodenal sweep are
unremarkable. No small bowel wall thickening or dilatation. No
evidence of obstruction. A normal appendix is visualized. No colonic
dilatation or wall thickening.

Vascular/Lymphatic: Limited evaluation of the vasculature and lymph
nodes given absence of contrast media. Atherosclerotic plaque within
the normal caliber aorta.

Reproductive: Normal appearance of the uterus and adnexal
structures.

Other: Diffusely increased attenuation of the mesentery, similar to
comparisons with a small volume intraperitoneal ascites layering in
the deep pelvis, diminished overall in volume from comparison exam.
Circumferential body wall edema of the abdomen and pelvis

Musculoskeletal: No acute osseous abnormality or suspicious osseous
lesion. Remote right L3-L4 transverse process fractures. Stable
joint bodies about the right hip. Bilateral above knee amputation
only partially visualized on toe program and not included in the
level of cross-sectional imaging. Small amount of increasing soft
tissue stranding superficial to the sacrum. No soft tissue gas or
organized abscess.
IMPRESSION: 1. Circumferential thickening of the urinary bladder wall, which may
represent cystitis. Recommend correlation with urinalysis.
2. Diffusely increased attenuation of the mesentery, similar to
comparisons with a small volume intraperitoneal ascites layering in
the deep pelvis, and diffuse body wall edema of the abdomen and
pelvis, consistent with anasarca.
3. Soft tissue stranding focally superficial to the sacrum.
Correlate with visual inspection to assess for a decubitus ulcer.
4. Marked cardiomegaly and coronary atherosclerosis.
5. Aortic Atherosclerosis ([id number].S).

## 2021-10-18 IMAGING — CR DG FEMUR 2+V*R*
2 series · 2 of 2 positions shown · non-contrast
Comparison: 08/13/2018

CLINICAL DATA: Pain at the level of above knee amputation stump.

EXAM:
RIGHT FEMUR 2 VIEWS

[x femur distal lat right]
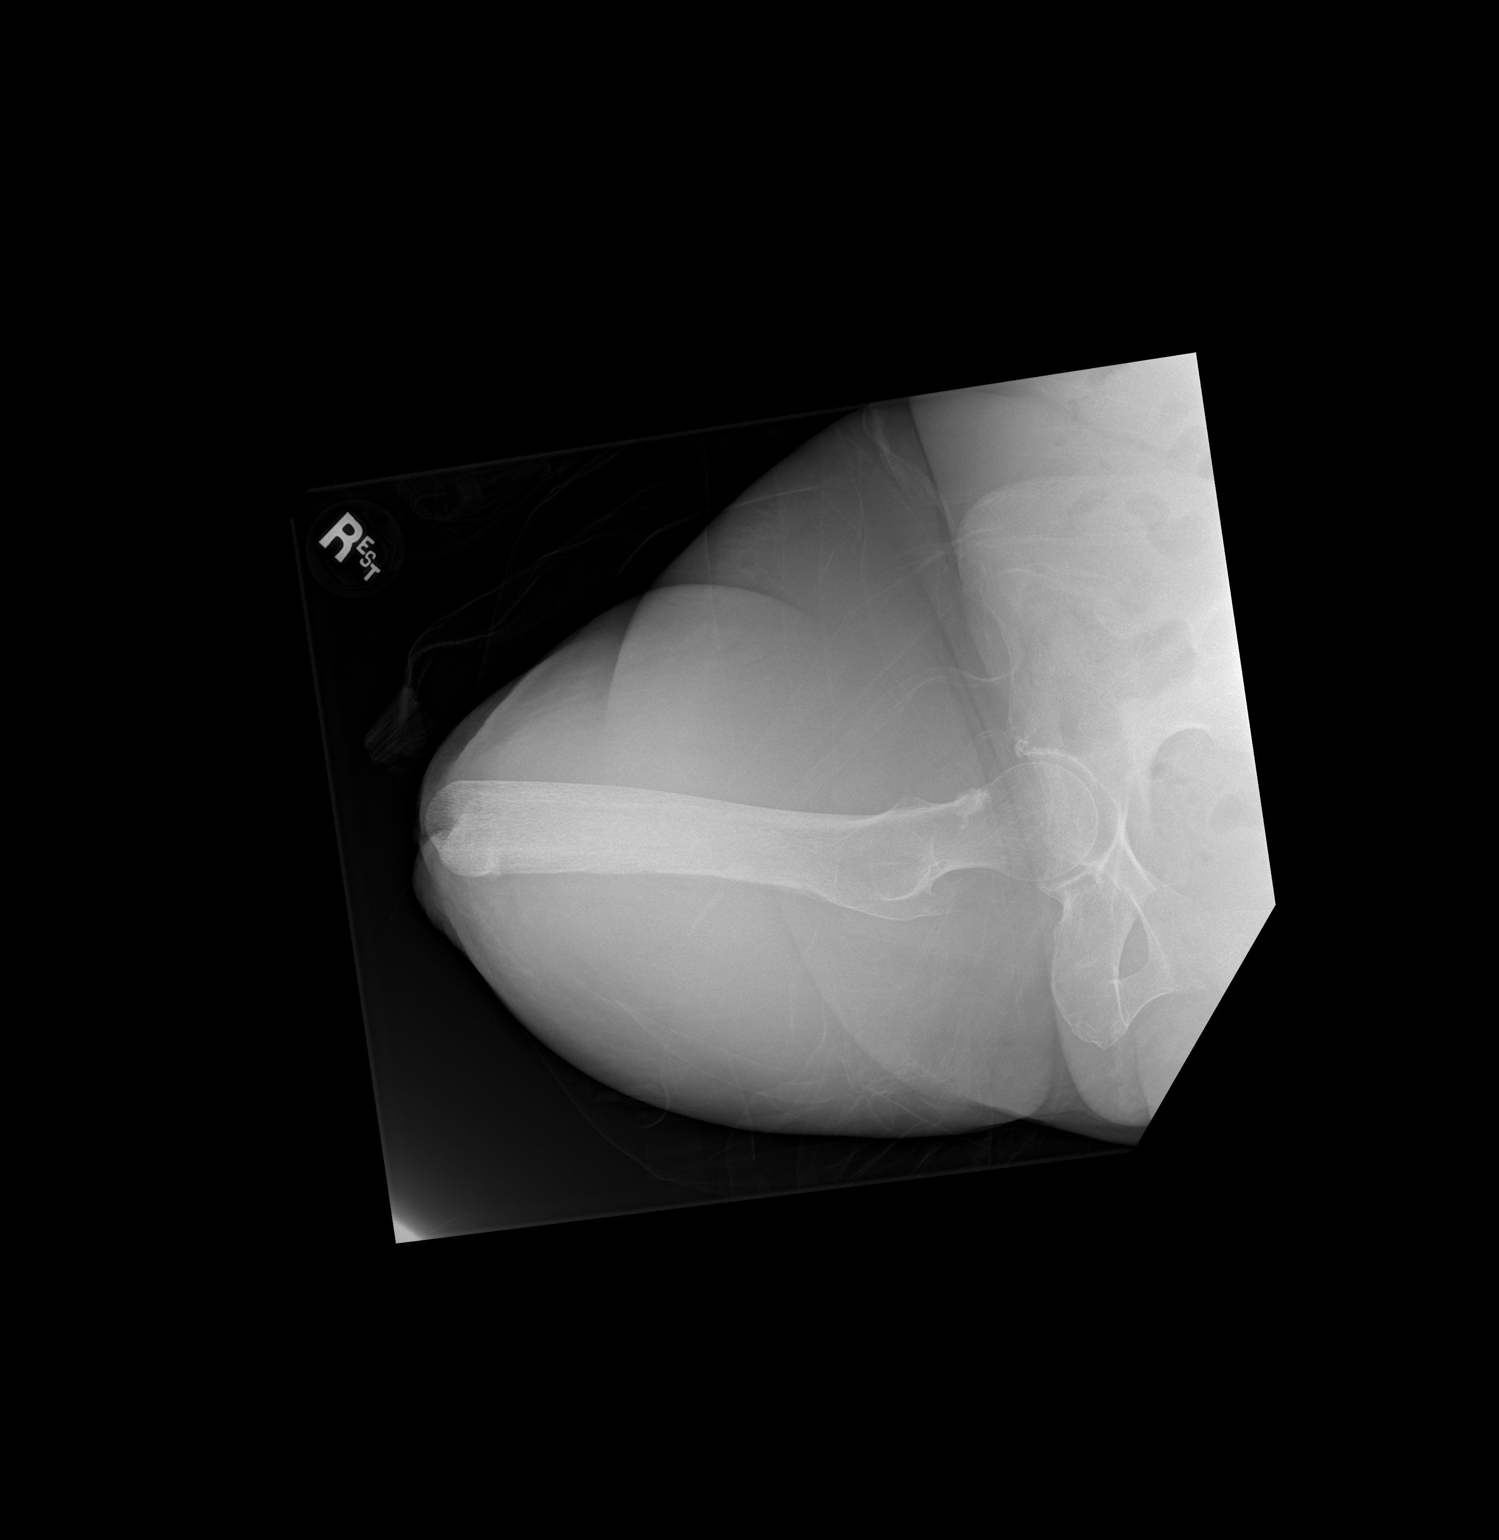

[x femur proximal ap right]
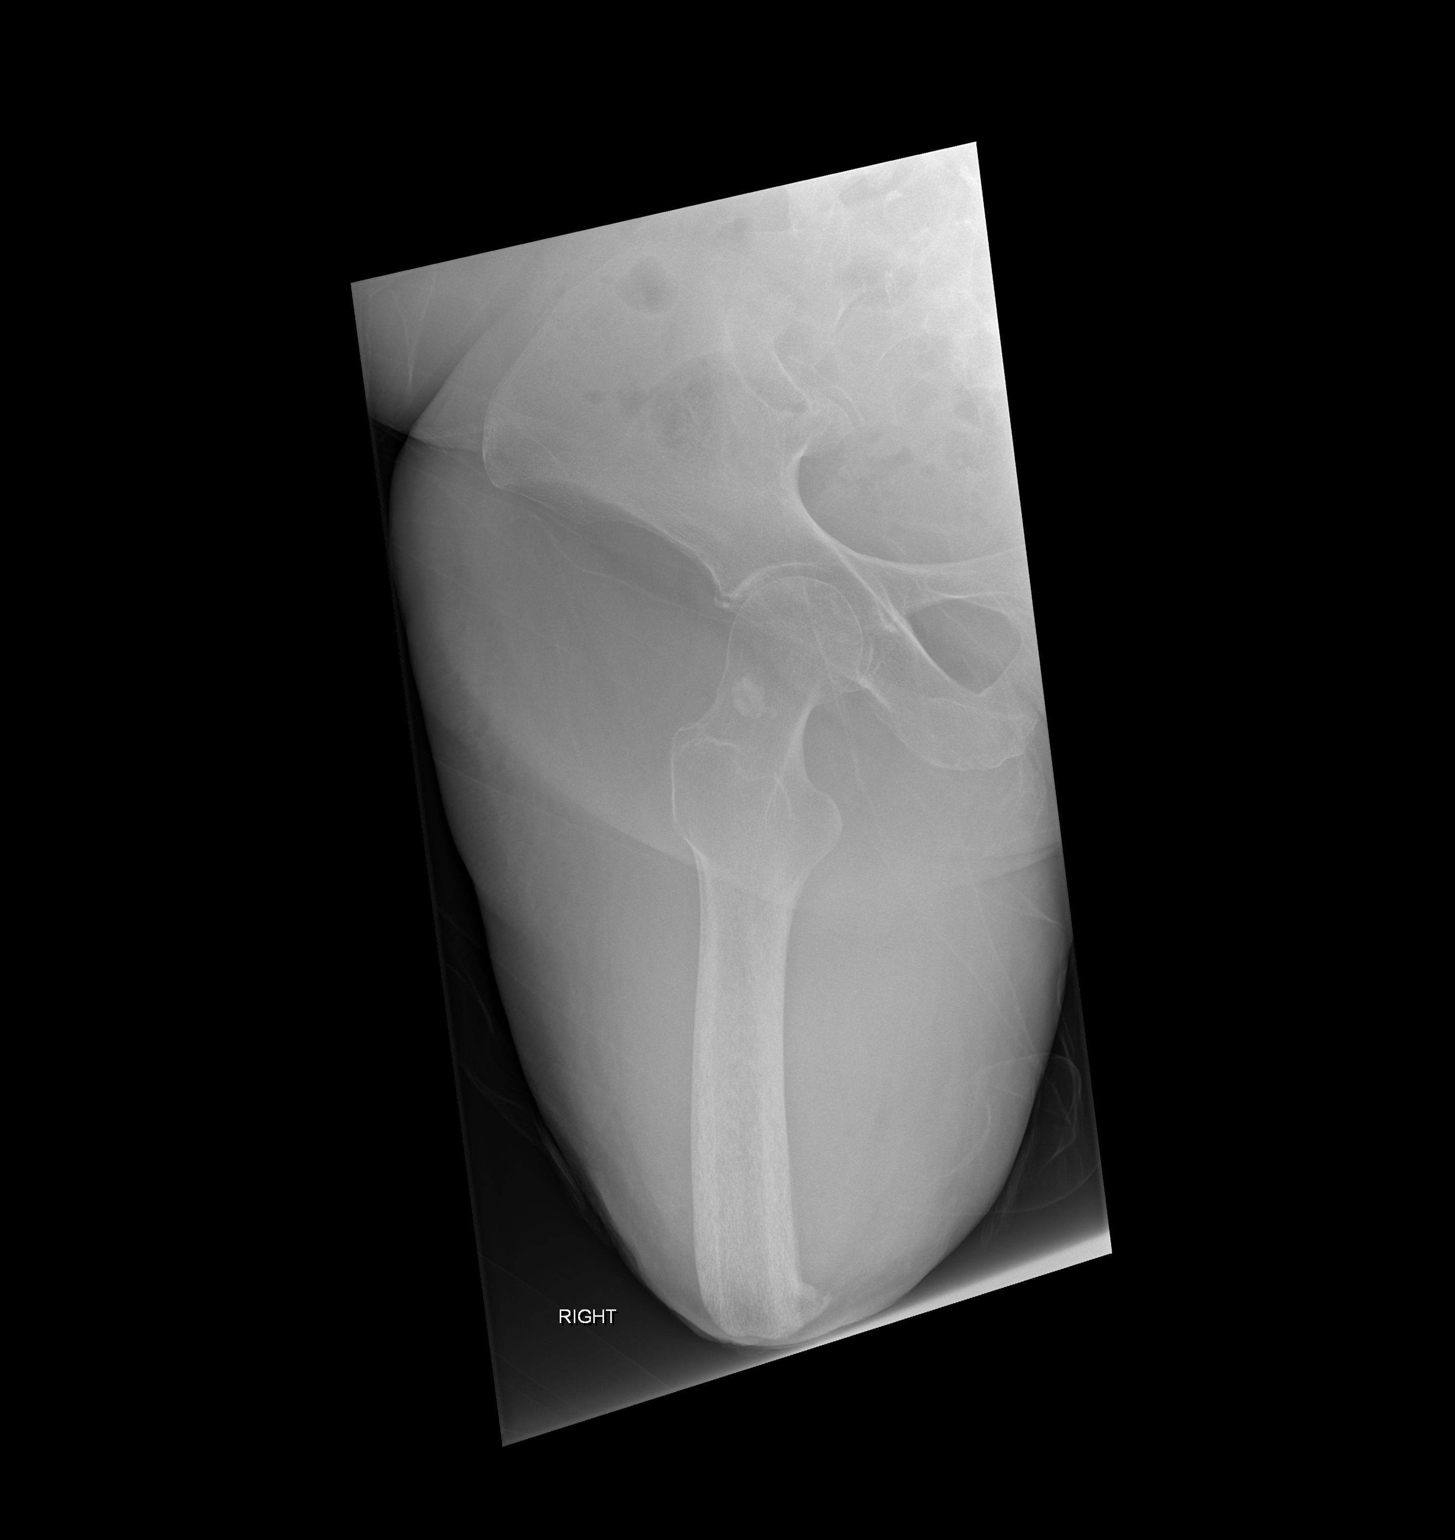

[2 of 2 positions shown; findings below may reference images not displayed]

FINDINGS: Stable appearance of residual right femur with no evidence of
fracture or bony destruction. Stable benign-appearing sclerotic
region in the femoral neck. Surrounding soft tissues appear
unremarkable. No soft tissue foreign body identified.
IMPRESSION: No evidence of fracture or radiographic evidence to suggest
osteomyelitis.

## 2021-10-19 IMAGING — MR MR FEMUR*R* WO/W CM
10 series · 40 of 40 positions shown · IV contrast (0)
Comparison: X-ray 11/04/2019, MRI 08/17/2018

CLINICAL DATA: History of AKA, stump wound. Evaluate for
osteomyelitis

EXAM:
MRI OF THE RIGHT FEMUR WITHOUT AND WITH CONTRAST
TECHNIQUE: Multiplanar, multisequence MR imaging of the right femur was
performed both before and after administration of intravenous
contrast.
CONTRAST:  5mL GADAVIST GADOBUTROL 1 MMOL/ML IV SOLN

[Series 5: T1 · coronal · 5.0mm · 1.41mm/px · 3 of 39 slices shown (1 of 2)]
[im 1/39]
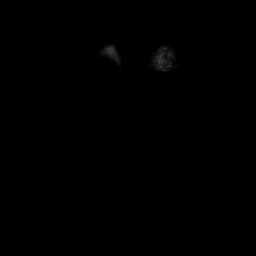
[im 20/39]
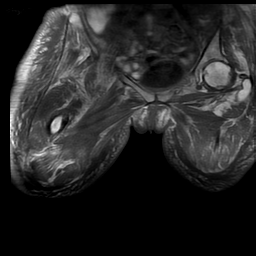
[im 39/39]
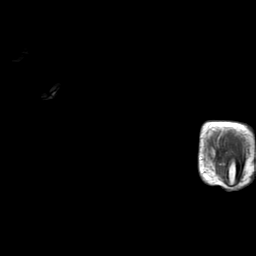

[Series 6: T1 · axial · 5.0mm · 0.94mm/px · z∈[-195,+115]mm · 4 of 50 slices shown (2 of 2)]
[im 1/50]
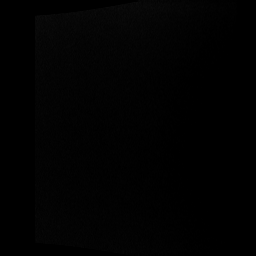
[im 17/50]
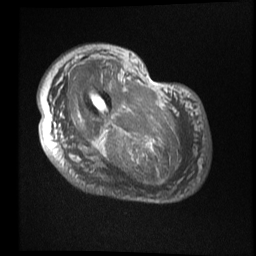
[im 33/50]
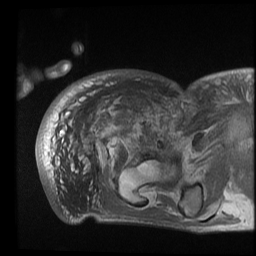
[im 50/50]
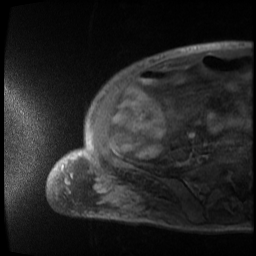

[Series 7: axial fse ir · axial · 5.0mm · 0.94mm/px · z∈[-195,+115]mm · 4 of 50 slices shown]
[im 1/50]
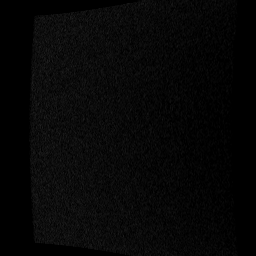
[im 17/50]
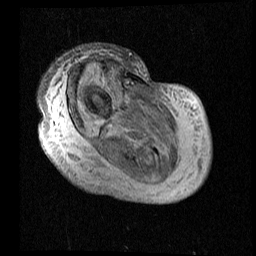
[im 33/50]
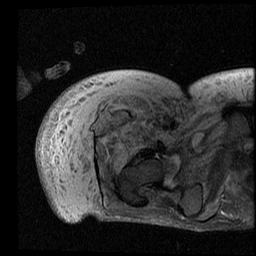
[im 50/50]
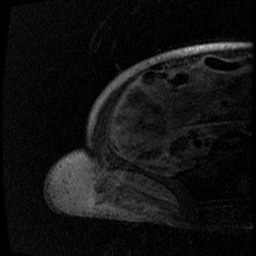

[Series 8: T2 fat-sat · axial · 5.0mm · 0.94mm/px · z∈[-195,+115]mm · 4 of 50 slices shown]
[im 1/50]
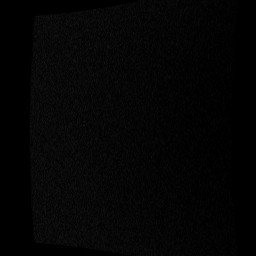
[im 17/50]
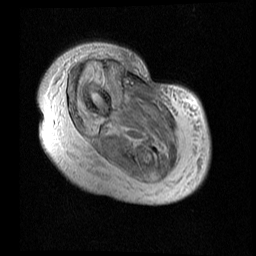
[im 33/50]
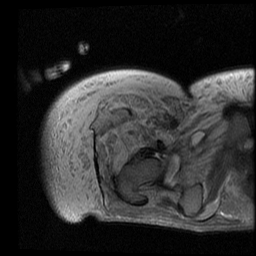
[im 50/50]
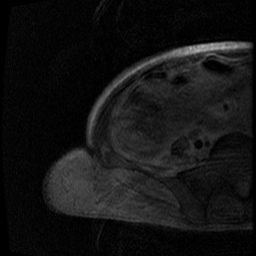

[Series 9: T1 fat-sat · coronal · 5.0mm · 1.41mm/px · 4 of 39 slices shown (1 of 2)]
[im 1/39]
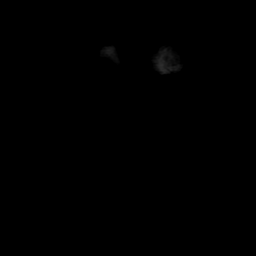
[im 13/39]
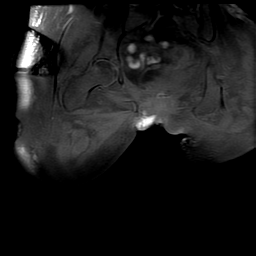
[im 26/39]
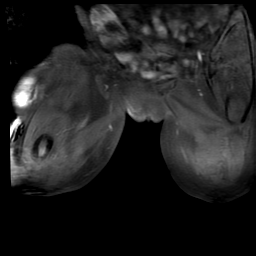
[im 39/39]
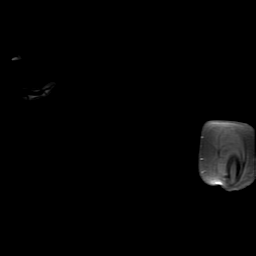

[Series 10: cor fse ir · coronal · 5.0mm · 1.41mm/px · 4 of 39 slices shown]
[im 1/39]
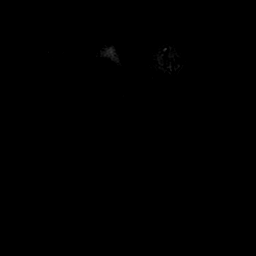
[im 13/39]
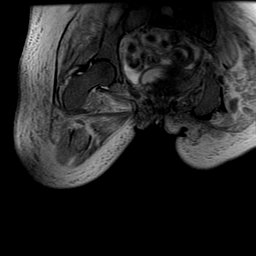
[im 26/39]
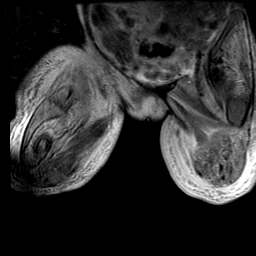
[im 39/39]
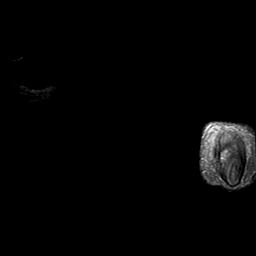

[Series 11: T2 · sagittal · 5.0mm · 0.98mm/px · 3 of 28 slices shown]
[im 1/28]
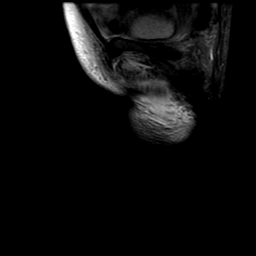
[im 14/28]
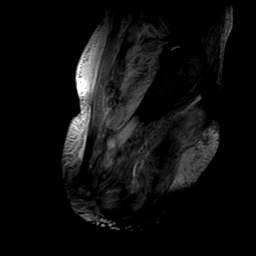
[im 28/28]
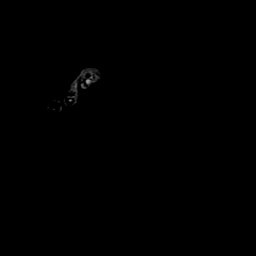

[Series 12: T1 fat-sat · axial · 5.0mm · 0.94mm/px · z∈[-195,+115]mm · 5 of 50 slices shown (2 of 2)]
[im 1/50]
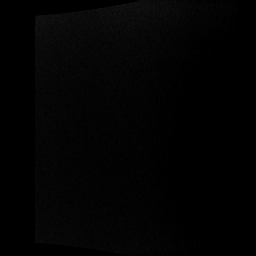
[im 13/50]
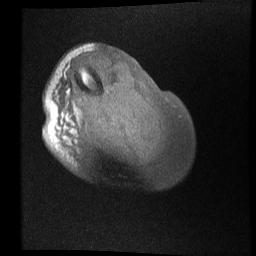
[im 25/50]
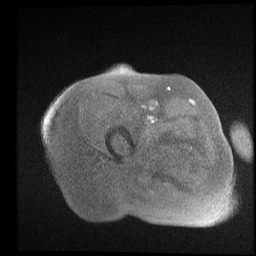
[im 37/50]
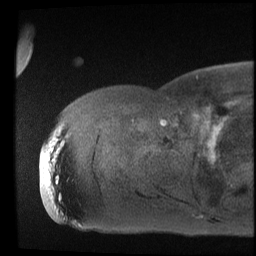
[im 50/50]
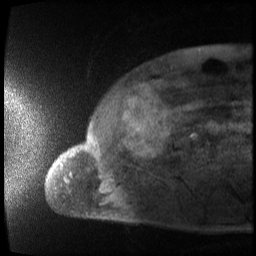

[Series 13: T1 fat-sat post-contrast · axial · 5.0mm · 0.94mm/px · z∈[-195,+115]mm · 5 of 50 slices shown]
[im 1/50]
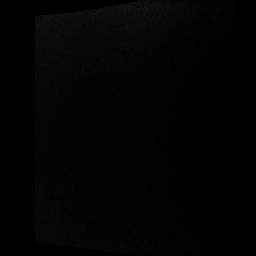
[im 13/50]
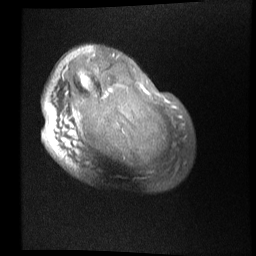
[im 25/50]
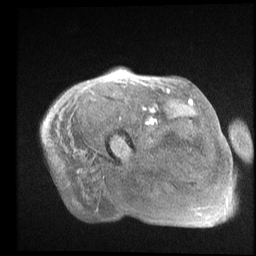
[im 37/50]
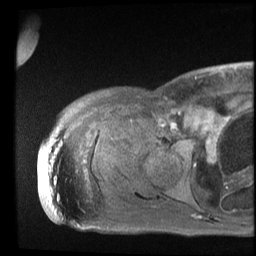
[im 50/50]
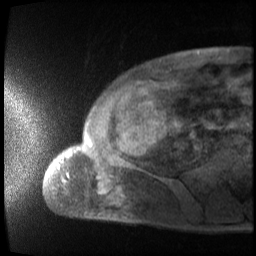

[Series 14: T1 post-contrast · coronal · 5.0mm · 1.41mm/px · 4 of 39 slices shown]
[im 1/39]
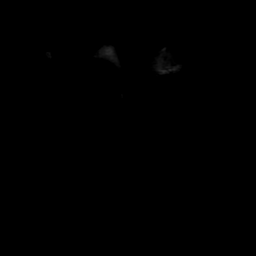
[im 13/39]
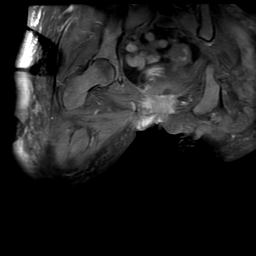
[im 26/39]
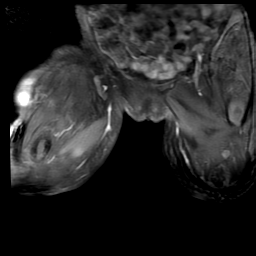
[im 39/39]
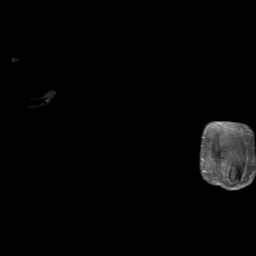

[40 of 40 positions shown; findings below may reference images not displayed]

FINDINGS: Technical note: Examination is degraded by motion artifact and poor
fat saturation at the edge of the field of view.

Bones/Joint/Cartilage

Prior above knee amputation at the distal femoral diaphysis. T1
fatty marrow signal appears preserved throughout the femur although
artifact slightly degrades the distal-most evaluation of the stump.
No abnormal bone marrow edema or enhancement is seen. Hip joint is
intact with mild degenerative changes. No fracture or dislocation.
No hip joint effusion.

Ligaments, Muscles, and Tendons

Diffuse T2 hyperintense signal throughout the visualized musculature
without appreciable intramuscular enhancement. Findings likely
reflecting chronic denervation changes. No well-defined
intramuscular fluid collections. Multiple postsurgical tendon stumps
are noted within the soft tissues. Stump neuromas are also noted.

Soft tissues

Marked circumferential soft tissue edema, which may be due to fluid
overload, anasarca, versus cellulitis. Stable small T2 hyperintense
collections adjacent to the bilateral pectineus muscles without
associated enhancement, are unchanged from prior. Otherwise, no
drainable soft tissue fluid collection. Ascites is noted within the
visualized portion of the pelvis.
IMPRESSION: 1. No MRI evidence of osteomyelitis or soft tissue abscess. Artifact
slightly degrades evaluation of the distal most aspect of the
femoral stump.
2. Marked circumferential soft tissue edema, which may be due to
fluid overload, anasarca, versus cellulitis.
3. Ascites within the visualized portion of the pelvis.

## 2021-10-29 IMAGING — DX DG CHEST 1V PORT
1 series · 1 of 1 positions shown · non-contrast
Comparison: 09/26/2019

CLINICAL DATA: Fever

EXAM:
PORTABLE CHEST 1 VIEW

[chest ap]
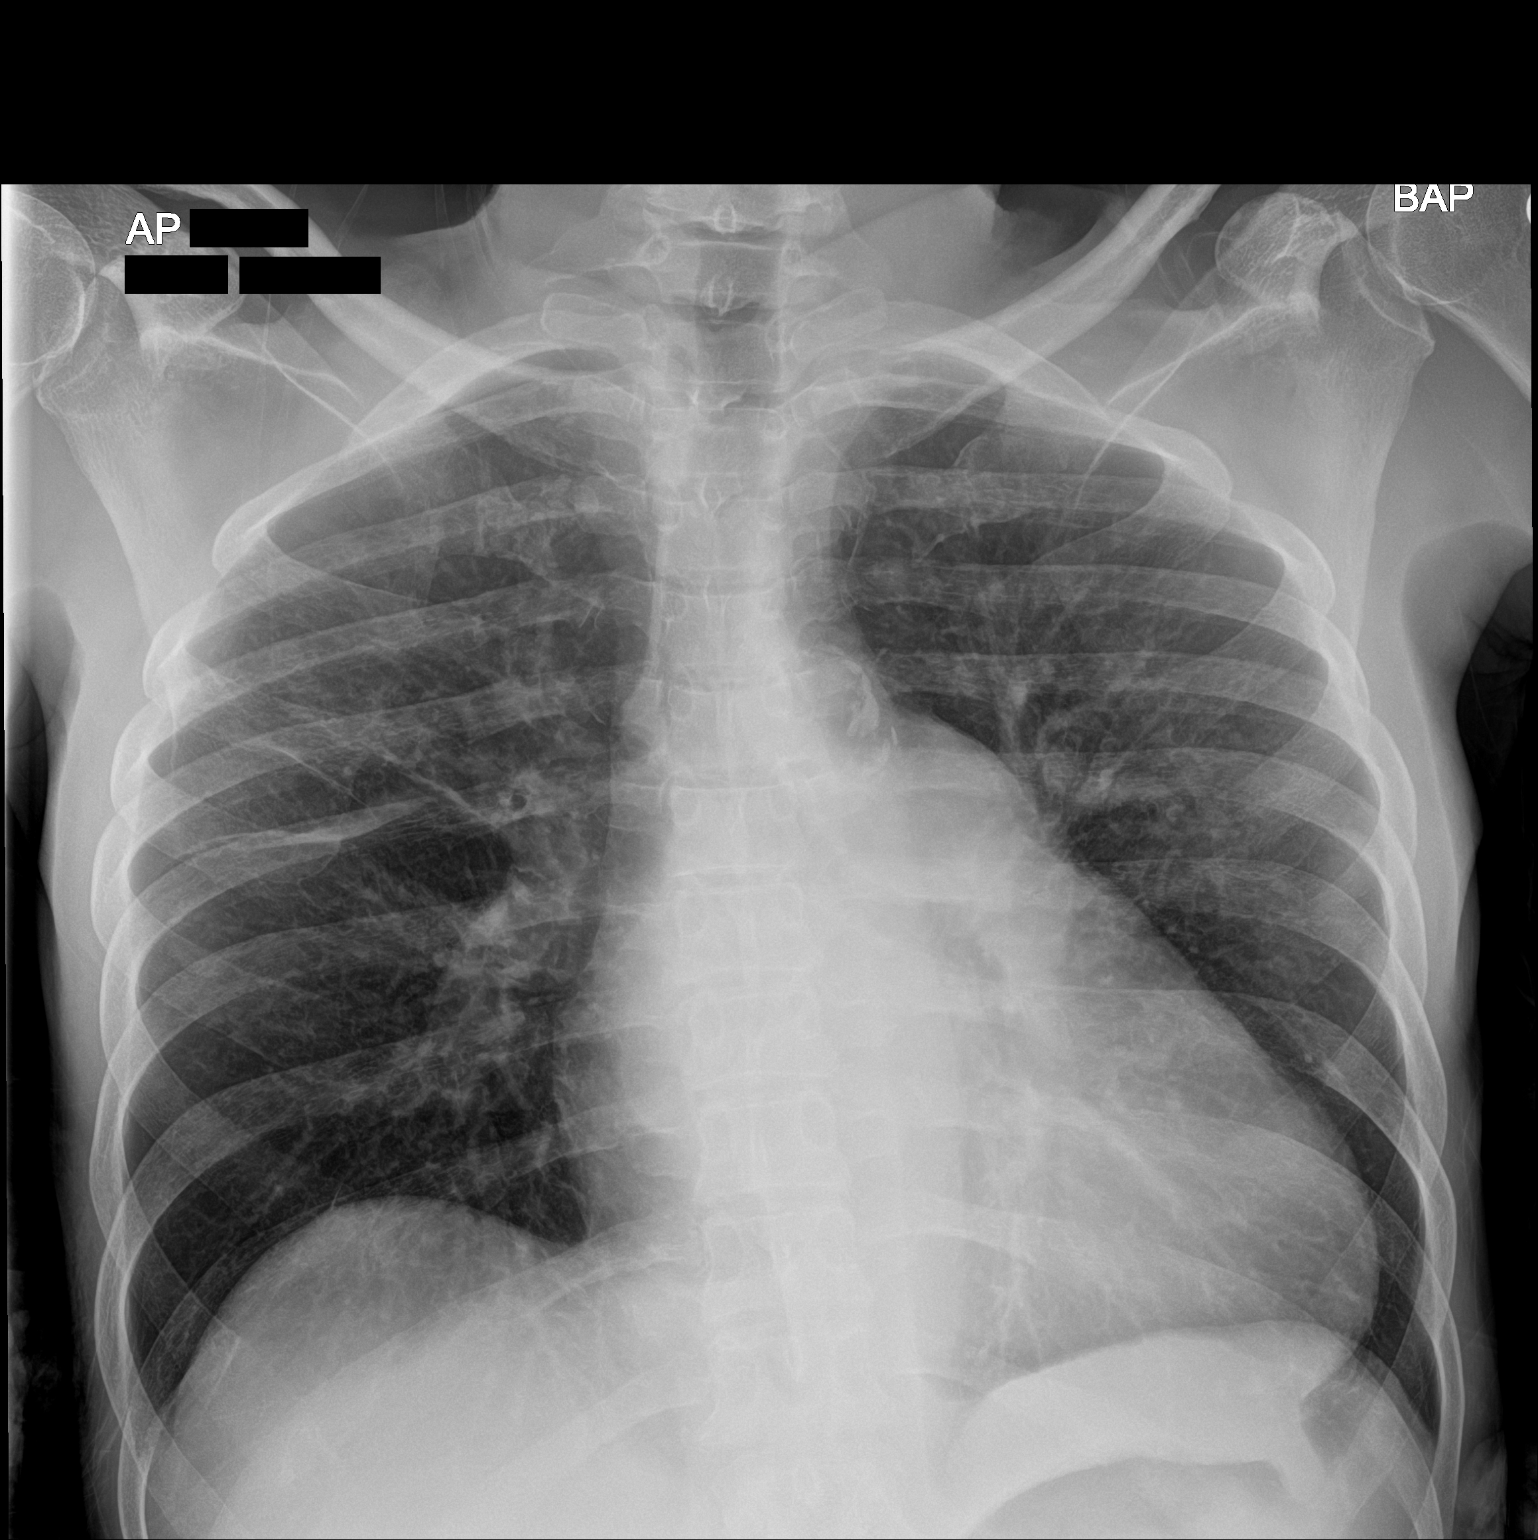

[1 of 1 positions shown; findings below may reference images not displayed]

FINDINGS: Cardiomegaly. Aortic atherosclerosis. Linear scarring in the right
upper lobe, stable. No acute confluent opacities or effusions. No
acute bony abnormality.
IMPRESSION: Cardiomegaly.  No acute cardiopulmonary disease.
